# Patient Record
Sex: Female | Born: 1965
Health system: Southern US, Community
[De-identification: ages and names within clinical notes are randomized; demographics above are authoritative.]

## PROBLEM LIST (undated history)

## (undated) ENCOUNTER — Emergency Department: Payer: Medicare HMO

## (undated) DIAGNOSIS — N393 Stress incontinence (female) (male): Secondary | ICD-10-CM

## (undated) DIAGNOSIS — M1990 Unspecified osteoarthritis, unspecified site: Secondary | ICD-10-CM

## (undated) DIAGNOSIS — Z86718 Personal history of other venous thrombosis and embolism: Secondary | ICD-10-CM

## (undated) DIAGNOSIS — R569 Unspecified convulsions: Secondary | ICD-10-CM

## (undated) DIAGNOSIS — I5189 Other ill-defined heart diseases: Secondary | ICD-10-CM

## (undated) DIAGNOSIS — I219 Acute myocardial infarction, unspecified: Secondary | ICD-10-CM

## (undated) DIAGNOSIS — N200 Calculus of kidney: Secondary | ICD-10-CM

## (undated) DIAGNOSIS — F32A Depression, unspecified: Secondary | ICD-10-CM

## (undated) DIAGNOSIS — I77810 Thoracic aortic ectasia: Secondary | ICD-10-CM

## (undated) DIAGNOSIS — K219 Gastro-esophageal reflux disease without esophagitis: Secondary | ICD-10-CM

## (undated) DIAGNOSIS — R011 Cardiac murmur, unspecified: Secondary | ICD-10-CM

## (undated) DIAGNOSIS — E11319 Type 2 diabetes mellitus with unspecified diabetic retinopathy without macular edema: Secondary | ICD-10-CM

## (undated) DIAGNOSIS — I509 Heart failure, unspecified: Secondary | ICD-10-CM

## (undated) DIAGNOSIS — F419 Anxiety disorder, unspecified: Secondary | ICD-10-CM

## (undated) DIAGNOSIS — G4730 Sleep apnea, unspecified: Secondary | ICD-10-CM

## (undated) DIAGNOSIS — I499 Cardiac arrhythmia, unspecified: Secondary | ICD-10-CM

## (undated) DIAGNOSIS — Z9289 Personal history of other medical treatment: Secondary | ICD-10-CM

## (undated) DIAGNOSIS — R51 Headache: Secondary | ICD-10-CM

## (undated) DIAGNOSIS — G40A09 Absence epileptic syndrome, not intractable, without status epilepticus: Secondary | ICD-10-CM

## (undated) DIAGNOSIS — H548 Legal blindness, as defined in USA: Secondary | ICD-10-CM

## (undated) DIAGNOSIS — J449 Chronic obstructive pulmonary disease, unspecified: Secondary | ICD-10-CM

## (undated) DIAGNOSIS — G43909 Migraine, unspecified, not intractable, without status migrainosus: Secondary | ICD-10-CM

## (undated) DIAGNOSIS — I10 Essential (primary) hypertension: Secondary | ICD-10-CM

## (undated) DIAGNOSIS — E785 Hyperlipidemia, unspecified: Secondary | ICD-10-CM

## (undated) DIAGNOSIS — F329 Major depressive disorder, single episode, unspecified: Secondary | ICD-10-CM

## (undated) DIAGNOSIS — N12 Tubulo-interstitial nephritis, not specified as acute or chronic: Secondary | ICD-10-CM

## (undated) DIAGNOSIS — J189 Pneumonia, unspecified organism: Secondary | ICD-10-CM

## (undated) DIAGNOSIS — R42 Dizziness and giddiness: Secondary | ICD-10-CM

## (undated) DIAGNOSIS — I639 Cerebral infarction, unspecified: Secondary | ICD-10-CM

## (undated) DIAGNOSIS — I6350 Cerebral infarction due to unspecified occlusion or stenosis of unspecified cerebral artery: Secondary | ICD-10-CM

## (undated) DIAGNOSIS — E1140 Type 2 diabetes mellitus with diabetic neuropathy, unspecified: Secondary | ICD-10-CM

## (undated) DIAGNOSIS — R52 Pain, unspecified: Secondary | ICD-10-CM

## (undated) DIAGNOSIS — G629 Polyneuropathy, unspecified: Secondary | ICD-10-CM

## (undated) HISTORY — DX: Calculus of kidney: N20.0

## (undated) HISTORY — DX: Personal history of other medical treatment: Z92.89

## (undated) HISTORY — DX: Other ill-defined heart diseases: I51.89

## (undated) HISTORY — DX: Thoracic aortic ectasia: I77.810

## (undated) HISTORY — DX: Cerebral infarction, unspecified: I63.9

## (undated) HISTORY — DX: Essential (primary) hypertension: I10

## (undated) HISTORY — DX: Type 2 diabetes mellitus with unspecified diabetic retinopathy without macular edema: E11.319

## (undated) HISTORY — DX: Unspecified convulsions: R56.9

## (undated) HISTORY — PX: OTHER SURGICAL HISTORY: SHX169

## (undated) HISTORY — DX: Anxiety disorder, unspecified: F41.9

## (undated) HISTORY — PX: SHUNT REMOVAL: SHX342

## (undated) HISTORY — DX: Major depressive disorder, single episode, unspecified: F32.9

## (undated) HISTORY — PX: BRAIN HEMATOMA EVACUATION: SHX573

---

## 1988-01-20 HISTORY — PX: RETINAL DETACHMENT SURGERY: SHX105

## 1994-01-19 HISTORY — PX: VAGINAL HYSTERECTOMY: SUR661

## 1997-04-27 ENCOUNTER — Encounter: Admission: RE | Admit: 1997-04-27 | Discharge: 1997-04-27 | Payer: Self-pay | Admitting: Internal Medicine

## 1997-05-11 ENCOUNTER — Encounter: Admission: RE | Admit: 1997-05-11 | Discharge: 1997-05-11 | Payer: Self-pay | Admitting: Obstetrics & Gynecology

## 1997-06-01 ENCOUNTER — Encounter: Admission: RE | Admit: 1997-06-01 | Discharge: 1997-06-01 | Payer: Self-pay | Admitting: Obstetrics & Gynecology

## 1997-06-01 ENCOUNTER — Encounter: Admission: RE | Admit: 1997-06-01 | Discharge: 1997-06-01 | Payer: Self-pay | Admitting: Internal Medicine

## 1997-06-04 ENCOUNTER — Inpatient Hospital Stay (HOSPITAL_COMMUNITY): Admission: EM | Admit: 1997-06-04 | Discharge: 1997-06-07 | Payer: Self-pay | Admitting: *Deleted

## 1997-07-02 ENCOUNTER — Inpatient Hospital Stay (HOSPITAL_COMMUNITY): Admission: AD | Admit: 1997-07-02 | Discharge: 1997-07-02 | Payer: Self-pay | Admitting: Obstetrics & Gynecology

## 1997-07-06 ENCOUNTER — Encounter: Admission: RE | Admit: 1997-07-06 | Discharge: 1997-07-06 | Payer: Self-pay | Admitting: Obstetrics & Gynecology

## 1997-07-27 ENCOUNTER — Encounter: Admission: RE | Admit: 1997-07-27 | Discharge: 1997-07-27 | Payer: Self-pay | Admitting: Obstetrics & Gynecology

## 1997-08-28 ENCOUNTER — Encounter: Admission: RE | Admit: 1997-08-28 | Discharge: 1997-08-28 | Payer: Self-pay | Admitting: Obstetrics & Gynecology

## 1997-09-19 ENCOUNTER — Encounter: Admission: RE | Admit: 1997-09-19 | Discharge: 1997-09-19 | Payer: Self-pay | Admitting: Internal Medicine

## 1997-09-25 ENCOUNTER — Other Ambulatory Visit: Admission: RE | Admit: 1997-09-25 | Discharge: 1997-09-25 | Payer: Self-pay | Admitting: Obstetrics & Gynecology

## 1997-09-25 ENCOUNTER — Encounter: Admission: RE | Admit: 1997-09-25 | Discharge: 1997-09-25 | Payer: Self-pay | Admitting: Obstetrics & Gynecology

## 1997-09-27 ENCOUNTER — Ambulatory Visit (HOSPITAL_COMMUNITY): Admission: RE | Admit: 1997-09-27 | Discharge: 1997-09-27 | Payer: Self-pay | Admitting: Obstetrics & Gynecology

## 1997-09-27 ENCOUNTER — Encounter: Payer: Self-pay | Admitting: Obstetrics & Gynecology

## 1997-11-20 ENCOUNTER — Encounter: Admission: RE | Admit: 1997-11-20 | Discharge: 1997-11-20 | Payer: Self-pay | Admitting: Obstetrics & Gynecology

## 1997-11-20 ENCOUNTER — Other Ambulatory Visit: Admission: RE | Admit: 1997-11-20 | Discharge: 1997-11-20 | Payer: Self-pay | Admitting: Obstetrics & Gynecology

## 1997-12-04 ENCOUNTER — Inpatient Hospital Stay (HOSPITAL_COMMUNITY): Admission: RE | Admit: 1997-12-04 | Discharge: 1997-12-06 | Payer: Self-pay | Admitting: *Deleted

## 1997-12-11 ENCOUNTER — Encounter: Admission: RE | Admit: 1997-12-11 | Discharge: 1997-12-11 | Payer: Self-pay | Admitting: Obstetrics & Gynecology

## 1997-12-11 ENCOUNTER — Encounter: Payer: Self-pay | Admitting: Obstetrics & Gynecology

## 1997-12-11 ENCOUNTER — Ambulatory Visit (HOSPITAL_COMMUNITY): Admission: RE | Admit: 1997-12-11 | Discharge: 1997-12-11 | Payer: Self-pay | Admitting: Obstetrics & Gynecology

## 1997-12-18 ENCOUNTER — Encounter: Admission: RE | Admit: 1997-12-18 | Discharge: 1997-12-18 | Payer: Self-pay | Admitting: Obstetrics & Gynecology

## 1998-01-21 ENCOUNTER — Encounter: Admission: RE | Admit: 1998-01-21 | Discharge: 1998-01-21 | Payer: Self-pay | Admitting: Internal Medicine

## 1998-01-22 ENCOUNTER — Encounter: Admission: RE | Admit: 1998-01-22 | Discharge: 1998-01-22 | Payer: Self-pay | Admitting: Obstetrics & Gynecology

## 1998-08-27 ENCOUNTER — Encounter: Admission: RE | Admit: 1998-08-27 | Discharge: 1998-08-27 | Payer: Self-pay | Admitting: Hematology and Oncology

## 1998-09-30 ENCOUNTER — Encounter: Admission: RE | Admit: 1998-09-30 | Discharge: 1998-09-30 | Payer: Self-pay | Admitting: Internal Medicine

## 2002-09-18 ENCOUNTER — Emergency Department (HOSPITAL_COMMUNITY): Admission: EM | Admit: 2002-09-18 | Discharge: 2002-09-18 | Payer: Self-pay | Admitting: Emergency Medicine

## 2002-12-19 ENCOUNTER — Emergency Department (HOSPITAL_COMMUNITY): Admission: EM | Admit: 2002-12-19 | Discharge: 2002-12-19 | Payer: Self-pay | Admitting: Emergency Medicine

## 2003-03-24 ENCOUNTER — Emergency Department (HOSPITAL_COMMUNITY): Admission: EM | Admit: 2003-03-24 | Discharge: 2003-03-24 | Payer: Self-pay | Admitting: Emergency Medicine

## 2006-09-10 ENCOUNTER — Encounter (INDEPENDENT_AMBULATORY_CARE_PROVIDER_SITE_OTHER): Payer: Self-pay | Admitting: Nurse Practitioner

## 2006-09-10 ENCOUNTER — Ambulatory Visit: Payer: Self-pay | Admitting: Internal Medicine

## 2006-09-10 LAB — CONVERTED CEMR LAB
Glucose, Bld: 113 mg/dL
Hgb A1c MFr Bld: 5.6 %

## 2006-09-13 ENCOUNTER — Encounter: Payer: Self-pay | Admitting: Nurse Practitioner

## 2006-09-13 DIAGNOSIS — R569 Unspecified convulsions: Secondary | ICD-10-CM | POA: Insufficient documentation

## 2006-09-13 DIAGNOSIS — G40A09 Absence epileptic syndrome, not intractable, without status epilepticus: Secondary | ICD-10-CM

## 2006-09-13 DIAGNOSIS — I252 Old myocardial infarction: Secondary | ICD-10-CM | POA: Insufficient documentation

## 2006-09-13 DIAGNOSIS — F32A Depression, unspecified: Secondary | ICD-10-CM | POA: Insufficient documentation

## 2006-09-13 DIAGNOSIS — G40909 Epilepsy, unspecified, not intractable, without status epilepticus: Secondary | ICD-10-CM | POA: Insufficient documentation

## 2006-09-13 DIAGNOSIS — F419 Anxiety disorder, unspecified: Secondary | ICD-10-CM

## 2006-09-13 DIAGNOSIS — I10 Essential (primary) hypertension: Secondary | ICD-10-CM | POA: Insufficient documentation

## 2006-09-13 DIAGNOSIS — E782 Mixed hyperlipidemia: Secondary | ICD-10-CM | POA: Insufficient documentation

## 2006-09-13 DIAGNOSIS — F329 Major depressive disorder, single episode, unspecified: Secondary | ICD-10-CM

## 2006-09-13 DIAGNOSIS — K219 Gastro-esophageal reflux disease without esophagitis: Secondary | ICD-10-CM | POA: Insufficient documentation

## 2006-09-13 DIAGNOSIS — F330 Major depressive disorder, recurrent, mild: Secondary | ICD-10-CM | POA: Insufficient documentation

## 2006-09-13 DIAGNOSIS — E785 Hyperlipidemia, unspecified: Secondary | ICD-10-CM | POA: Insufficient documentation

## 2006-09-13 HISTORY — DX: Depression, unspecified: F32.A

## 2006-09-13 HISTORY — DX: Anxiety disorder, unspecified: F41.9

## 2006-09-13 HISTORY — DX: Absence epileptic syndrome, not intractable, without status epilepticus: G40.A09

## 2006-09-14 DIAGNOSIS — G43909 Migraine, unspecified, not intractable, without status migrainosus: Secondary | ICD-10-CM

## 2006-09-14 DIAGNOSIS — H548 Legal blindness, as defined in USA: Secondary | ICD-10-CM | POA: Insufficient documentation

## 2006-09-14 HISTORY — DX: Migraine, unspecified, not intractable, without status migrainosus: G43.909

## 2006-09-21 ENCOUNTER — Ambulatory Visit: Payer: Self-pay | Admitting: Internal Medicine

## 2006-09-21 LAB — CONVERTED CEMR LAB
ALT: 12 units/L (ref 0–35)
AST: 7 units/L (ref 0–37)
Albumin: 4 g/dL (ref 3.5–5.2)
Alkaline Phosphatase: 59 units/L (ref 39–117)
BUN: 12 mg/dL (ref 6–23)
Basophils Absolute: 0 10*3/uL (ref 0.0–0.1)
Basophils Relative: 0 % (ref 0–1)
CO2: 18 meq/L — ABNORMAL LOW (ref 19–32)
Calcium: 9.1 mg/dL (ref 8.4–10.5)
Chloride: 109 meq/L (ref 96–112)
Cholesterol: 136 mg/dL (ref 0–200)
Creatinine, Ser: 0.83 mg/dL (ref 0.40–1.20)
Eosinophils Absolute: 0 10*3/uL (ref 0.0–0.7)
Eosinophils Relative: 0 % (ref 0–5)
Glucose, Bld: 87 mg/dL (ref 70–99)
HCT: 39.4 % (ref 36.0–46.0)
HDL: 37 mg/dL — ABNORMAL LOW (ref >39)
Hemoglobin: 12.1 g/dL (ref 12.0–15.0)
LDL Cholesterol: 81 mg/dL (ref 0–99)
Lymphocytes Relative: 16 % (ref 12–46)
Lymphs Abs: 1 10*3/uL (ref 0.7–3.3)
MCHC: 30.7 g/dL (ref 30.0–36.0)
MCV: 98 fL (ref 78.0–100.0)
Monocytes Absolute: 0.6 10*3/uL (ref 0.2–0.7)
Monocytes Relative: 9 % (ref 3–11)
Neutro Abs: 4.7 10*3/uL (ref 1.7–7.7)
Neutrophils Relative %: 75 % (ref 43–77)
Platelets: 263 10*3/uL (ref 150–400)
Potassium: 4.1 meq/L (ref 3.5–5.3)
RBC: 4.02 M/uL (ref 3.87–5.11)
RDW: 14.4 % — ABNORMAL HIGH (ref 11.5–14.0)
Sodium: 140 meq/L (ref 135–145)
TSH: 0.584 microintl units/mL (ref 0.350–5.50)
Total Bilirubin: 0.3 mg/dL (ref 0.3–1.2)
Total CHOL/HDL Ratio: 3.7
Total Protein: 7.4 g/dL (ref 6.0–8.3)
Triglycerides: 91 mg/dL (ref <150)
VLDL: 18 mg/dL (ref 0–40)
WBC: 6.2 10*3/uL (ref 4.0–10.5)

## 2006-09-27 ENCOUNTER — Telehealth (INDEPENDENT_AMBULATORY_CARE_PROVIDER_SITE_OTHER): Payer: Self-pay | Admitting: Nurse Practitioner

## 2006-09-30 ENCOUNTER — Ambulatory Visit: Payer: Self-pay | Admitting: Internal Medicine

## 2006-09-30 DIAGNOSIS — E119 Type 2 diabetes mellitus without complications: Secondary | ICD-10-CM | POA: Insufficient documentation

## 2006-09-30 LAB — CONVERTED CEMR LAB
Bilirubin Urine: NEGATIVE
Blood Glucose, Fingerstick: 123
Blood in Urine, dipstick: NEGATIVE
Glucose, Urine, Semiquant: NEGATIVE
Ketones, urine, test strip: NEGATIVE
Microalb, Ur: 1.88 mg/dL (ref 0.00–1.89)
Nitrite: NEGATIVE
Protein, U semiquant: NEGATIVE
Specific Gravity, Urine: 1.02
Urobilinogen, UA: 0.2
WBC Urine, dipstick: NEGATIVE
pH: 6.5

## 2006-10-12 ENCOUNTER — Encounter (INDEPENDENT_AMBULATORY_CARE_PROVIDER_SITE_OTHER): Payer: Self-pay | Admitting: Family Medicine

## 2006-10-12 ENCOUNTER — Ambulatory Visit: Payer: Self-pay | Admitting: Nurse Practitioner

## 2006-10-12 DIAGNOSIS — R42 Dizziness and giddiness: Secondary | ICD-10-CM | POA: Insufficient documentation

## 2006-10-12 LAB — CONVERTED CEMR LAB: Blood Glucose, Fingerstick: 106

## 2006-10-15 ENCOUNTER — Emergency Department (HOSPITAL_COMMUNITY): Admission: EM | Admit: 2006-10-15 | Discharge: 2006-10-15 | Payer: Self-pay | Admitting: Emergency Medicine

## 2006-10-15 LAB — CONVERTED CEMR LAB
ALT: 14 units/L
AST: 11 units/L
Albumin: 3.1 g/dL
Alkaline Phosphatase: 47 units/L
BUN: 9 mg/dL
CO2: 21 meq/L
Calcium: 8.8 mg/dL
Chloride: 108 meq/L
Creatinine, Ser: 0.86 mg/dL
Glucose, Bld: 119 mg/dL
HCT: 33.2 %
Hemoglobin: 11.1 g/dL
MCHC: 33.4 g/dL
MCV: 93.1 fL
Platelets: 229 10*3/uL
Potassium: 3.6 meq/L
RBC: 3.57 M/uL
RDW: 14.2 %
Sodium: 134 meq/L
Total Bilirubin: 0.6 mg/dL
Total Protein: 6.4 g/dL
WBC: 17.2 10*3/uL

## 2006-10-17 ENCOUNTER — Encounter (INDEPENDENT_AMBULATORY_CARE_PROVIDER_SITE_OTHER): Payer: Self-pay | Admitting: Nurse Practitioner

## 2006-10-18 ENCOUNTER — Telehealth (INDEPENDENT_AMBULATORY_CARE_PROVIDER_SITE_OTHER): Payer: Self-pay | Admitting: Nurse Practitioner

## 2006-10-21 ENCOUNTER — Telehealth (INDEPENDENT_AMBULATORY_CARE_PROVIDER_SITE_OTHER): Payer: Self-pay | Admitting: *Deleted

## 2006-10-25 ENCOUNTER — Ambulatory Visit: Payer: Self-pay | Admitting: Nurse Practitioner

## 2006-10-25 LAB — CONVERTED CEMR LAB
Basophils Absolute: 0 10*3/uL (ref 0.0–0.1)
Basophils Relative: 0 % (ref 0–1)
Blood Glucose, Fingerstick: 119
Eosinophils Absolute: 0 10*3/uL (ref 0.0–0.7)
Eosinophils Relative: 0 % (ref 0–5)
HCT: 42.6 % (ref 36.0–46.0)
Hemoglobin: 13.2 g/dL (ref 12.0–15.0)
Lymphocytes Relative: 25 % (ref 12–46)
Lymphs Abs: 1.8 10*3/uL (ref 0.7–3.3)
MCHC: 31 g/dL (ref 30.0–36.0)
MCV: 98.4 fL (ref 78.0–100.0)
Monocytes Absolute: 0.5 10*3/uL (ref 0.2–0.7)
Monocytes Relative: 7 % (ref 3–11)
Neutro Abs: 4.9 10*3/uL (ref 1.7–7.7)
Neutrophils Relative %: 68 % (ref 43–77)
Platelets: 433 10*3/uL — ABNORMAL HIGH (ref 150–400)
RBC: 4.33 M/uL (ref 3.87–5.11)
RDW: 14.3 % — ABNORMAL HIGH (ref 11.5–14.0)
WBC: 7.2 10*3/uL (ref 4.0–10.5)

## 2006-10-26 ENCOUNTER — Telehealth (INDEPENDENT_AMBULATORY_CARE_PROVIDER_SITE_OTHER): Payer: Self-pay | Admitting: Nurse Practitioner

## 2007-03-20 LAB — CONVERTED CEMR LAB: Pap Smear: NORMAL

## 2007-11-14 ENCOUNTER — Ambulatory Visit: Payer: Self-pay | Admitting: Nurse Practitioner

## 2007-11-14 DIAGNOSIS — I6350 Cerebral infarction due to unspecified occlusion or stenosis of unspecified cerebral artery: Secondary | ICD-10-CM | POA: Insufficient documentation

## 2007-11-14 LAB — CONVERTED CEMR LAB
Blood Glucose, Fingerstick: 131
Hgb A1c MFr Bld: 6.3 %

## 2007-12-29 ENCOUNTER — Encounter (INDEPENDENT_AMBULATORY_CARE_PROVIDER_SITE_OTHER): Payer: Self-pay | Admitting: Nurse Practitioner

## 2007-12-30 ENCOUNTER — Encounter (INDEPENDENT_AMBULATORY_CARE_PROVIDER_SITE_OTHER): Payer: Self-pay | Admitting: Nurse Practitioner

## 2007-12-30 ENCOUNTER — Emergency Department (HOSPITAL_COMMUNITY): Admission: EM | Admit: 2007-12-30 | Discharge: 2007-12-31 | Payer: Self-pay | Admitting: Emergency Medicine

## 2008-02-01 ENCOUNTER — Encounter (INDEPENDENT_AMBULATORY_CARE_PROVIDER_SITE_OTHER): Payer: Self-pay | Admitting: Nurse Practitioner

## 2010-02-18 NOTE — Assessment & Plan Note (Signed)
Summary: PER [name] MARTIN/ F/U ONE MONTH//GK   Vital Signs:  Patient Profile:   45 Years Old Female Height:     64.4 inches Weight:      225 pounds BMI:     38.28 BSA:     2.07 Temp:     97 degrees F oral Pulse rate:   96 / minute Pulse rhythm:   regular Resp:     20 per minute BP sitting:   114 / 80  (right arm) Cuff size:   regular  Pt. in pain?   yes    Location:   left side    Intensity:   8    Type:       tingling  Vitals Entered By: Bridgett Larsson RN (October 12, 2006 11:36 AM)              Is Patient Diabetic? Yes  CBG Result 106 CBG Device ID b     History of Present Illness:   45 yo AA female with h/o CVA,MI(2003),DM,HTN,Depression.   Here c/o dizziness and fatigue. Notes chronic left-sided weakness since CVA in 2003.Has chrronic h/o tingling in left leg and now feels tingling in left arm.Denies chest pain or SOB. Pt denies changes in vision or new-onset muscle weakness. She is blind in her left eye and can only see some light. She has known aphasia since CVA, seems initially to be talking slower but when starts to converse with animation,her speech speeds up and is  easily understood by MD and only slightly slurred. Her Husband is a limited historian but says pt has not c/o anything but dizziness today.  Pt herself is a good historian and when prompted can recall her meds and doses. She can recall dates and info on her past medical history. She is knowledgeable enough to provide info to request old records from [facility], including maiden name and the fact that her date of birth will be needed.  During visit,Pt becomes tearful. She feels overwhelmed since leaving her assissted-living facility 2 months ago. She and her husband(who has psych/medical problems) moved in with a daughter. the plan was for patient to watch the grandchildren each day. Pt feels that she is unable to continue to do this every day and told her daughter this info this AM. Patient is unsure about  staying with daughter or returning to the assissted living. 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 She liked the assisted living and notes that the staff did not want her to leave. Pt says she is her own guardian. She denies any other psychiatric history other that depression. She denies any history of psychiatric hospitalizations. Pt reports that her sugars have been very low and that she has not been eating meals as she should.@ days ago, she had a BS of 42.  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       MIGRAINE       CVA X4       Hyperlipidemia       Myocardial infarction, hx  of X3       BLADDER PROBLEMS  Past Surgical History:    Reviewed history from 09/13/2006 and no changes required:       HEMATOMA REMOVAL ON LFT SIDE OF BRAIN        S/P SHUNT BUT REMOVED AT AGE 79       GYN surgery PARTIAL HYSTORECTOMY AGE 56       GYN surgery TUBAL LIGATION 23     Review of Systems      See HPI   Physical Exam  General:     Well-developed,well-nourished,in no acute distress; alert,appropriate and cooperative throughout examination.Flat affect Eyes:     No corneal or conjunctival inflammation noted. Has minimal pupillary reaction on left and cannot see 2 fingers directly in front of left eye. Right eye with PERRLA and EOMI. Fundoscopic exam with sharp optic disc. Ears:     External ear exam shows no significant lesions or deformities.  Otoscopic examination reveals clear canals, tympanic membranes are intact bilaterally without bulging, retraction, inflammation or discharge. Hearing is grossly normal bilaterally. Nose:     External nasal examination shows no deformity or inflammation. Nasal mucosa  are pink and moist without lesions or exudates. Mouth:     Oral mucosa and oropharynx without lesions or exudates.  No problem with swallowing or drooling. Summetrical palate lift and normal gag reflex. Neck:     supple, full ROM, no masses, and no thyromegaly.   No carotid bruits Lungs:     Normal respiratory effort, chest expands symmetrically. Lungs are clear to auscultation, no crackles or wheezes. Heart:     Normal rate and regular rhythm. S1 and S2 normal without gallop, murmur, click, rub or other extra sounds. Msk:     UE exam: Rt strenght all muscle groups and grip 5/5. Left grip 4/5. Initially,deltoid testing with give-away presentation on exam but pt improved with coaching to 4/5.Biceps-tricepts testing 4/5.  Patient able to hold both arms up over head for 2 minutes without any drift on either side. LE exam: Right side 5/5 all muscle groups. Left side 5/5 all groups except resisted dorsiflexion which was 4/5. Reflexes 3+ symmetric throughout. Neurologic:     No cranial nerve deficits noted. Station and gait are normal. DTRs are symmetrical throughout.  Cranial nerves intact to specific testing except chronic left eye blindness. Psych:     Oriented X3, memory intact for recent and remote, depressed affect, flat affect, poor eye contact, and tearful.Speech with baseline dysarthria;improves as patient becomes more animated in conversation.      Impression & Recommendations:  Problem # 1:  DIZZINESS (ICD-780.4) Suspect multi-factorial; from tight sugar control,depression,poor eating habits,anxiety. Her blood pressure is controlled today Pt to eat three times a day and have bedtime snack.She is to monitor CBGs;especially if feels dizzy or ill. Will decrease metformin to 53m 1/2 tablet by mouth two times a day to see is this helps. Pt to f/u in 2 weeks for recheck,sooner if dizziness persists or worsens. 09/22/2006 labwork all normal and reviewed with pt.  Problem # 2:  DIABETES  MELLITUS, TYPE II (ICD-250.00)  The following medications were removed from the medication list:    Metformin Hcl 500 Mg Tabs (Metformin hcl) ..[name]..Marland KitchenTwo times a day  Her updated medication list for this problem includes:    Lisinopril 10 Mg Tabs (Lisinopril) ..[name].. 1 by mouth once daily    Adult Aspirin Low Strength 81 Mg Tbdp (Aspirin) ..[name].. 1 by mouth qd  Glucophage 500 Mg Tabs (Metformin hcl) .[name]... Take 1/2 tablet by mouth bid  Orders: Capillary Blood Glucose (82948) Fingerstick (95188)   Problem # 3:  DEPRESSION (ICD-311)  Her updated medication list for this problem includes:    Fluoxetine Hcl 20 Mg Caps (Fluoxetine hcl) .Marland KitchenMarland KitchenMarland KitchenMarland Kitchen 3 by mouth q am    Trazodone Hcl 100 Mg Tabs (Trazodone hcl) .[name]... 1 by mouth qhs Pt to try decreasing her trazodone to 1/2 (129m) tablet at bedtime to see if this is a factor in dizziness. Suspect depression is not fully treated and she may need referral for counseling and help deciding on whether she wishes to return to her previous assisted-living situation.  Problem # 4:  UPPER RESPIRATORY INFECTION (ICD-465.9) Resolving per patient. PT requests tessalon perles for occ cough. She was unable to afford the tussionex. The following medications were removed from the medication list:    Tussionex Pennkinetic Er 8-10 Mg/515mLqcr (Chlorpheniramine-hydrocodone) ...[name]. 1 teaspoon every 12 hours as needed for cough  Her updated medication list for this problem includes:    Adult Aspirin Low Strength 81 Mg Tbdp (Aspirin) ...[name]. 1 by mouth qd    Tessalon Perles 100 Mg Caps (Benzonatate) ...[name]. Take 1 pill  by mouth every 8 hours as needed for cough   Complete Medication List: 1)  Lisinopril 10 Mg Tabs (Lisinopril) ...[name] 1 by mouth once daily 2)  Omeprazole 20 Mg Cpdr (Omeprazole) .... I by mouth once daily 3)  Fluoxetine Hcl 20 Mg Caps (Fluoxetine hcl) .... 3 by mouth q am 4)  Verapamil Hcl Cr 240 Mg Cp24 (Verapamil hcl) ...[name] 1 by mouth once daily 5)   Simvastatin 20 Mg Tabs (Simvastatin) ...[name] 1 by mouth once daily 6)  Plavix 75 Mg Tabs (Clopidogrel bisulfate) ...[name] 1 by mouth once daily 7)  Adult Aspirin Low Strength 81 Mg Tbdp (Aspirin) ...[name] 1 by mouth qd 8)  Trazodone Hcl 100 Mg Tabs (Trazodone hcl) ...[name] 1 by mouth qhs 9)  Darvocet-n 100 100-650 Mg Tabs (Propoxyphene n-apap) .... Q6 prn 10)  Lyrica 100 Mg Caps (Pregabalin) ...[name] 1 by mouth two times a day 11)  Topamax 100 Mg Tabs (Topiramate) ...[name] 1 by mouth q am 12)  Topamax 100 Mg Tabs (Topiramate) .... 2 by mouth  q hs 13)  Amoxil 500 Mg Caps (Amoxicillin) ...[name] 1 tablet by mouth three times a day for infection 14)  Glucophage 500 Mg Tabs (Metformin hcl) .... Take 1/2 tablet by mouth bid 15)  Tessalon Perles 100 Mg Caps (Benzonatate) .... Take 1 pill  by mouth every 8 hours as needed for cough   Patient Instructions: 1)  Please schedule a follow-up appointment in 2 weeks(with Dr.Caidyn Henricksen) 2)  Decrease metformin to a half a pill AM and PM.MUST EAT 3 meals and snacks,especially at bedtime. 3)  Check sugars AM and PM. Notify Doctor if sugars falling below 60. 4)  .    Prescriptions: TESSALON PERLES 100 MG  CAPS (BENZONATATE) Take 1 pill  by mouth every 8 hours as needed for cough  #30 x 0   Entered and Authorized by:   ViMilagros EvenerD   Signed by:   ViMilagros EvenerD on 10/12/2006   Method used:   Print then Give to Patient   RxID:   [id number] 500 MG  TABS (METFORMIN HCL) Take 1/2 tablet by mouth BID  #15 x 1   Entered and Authorized by:   ViMilagros EvenerD   Signed by:  Milagros Evener MD on 10/12/2006   Method used:   Print then Give to Patient   RxID:   [id number] GLUCOPHAGE 500 MG  TABS (METFORMIN HCL) Take 1/2 tablet by mouth BID  #15 x 1   Entered and Authorized by:   Milagros Evener MD   Signed by:   Milagros Evener MD on 10/12/2006   Method used:   Print then Give to Patient   RxID:   [id number]  ]

## 2010-02-18 NOTE — Assessment & Plan Note (Signed)
Summary: Pneumonia/DM   Vital Signs:  Patient Profile:   45 Years Old Female Height:     64.4 inches Weight:      213 pounds BMI:     36.24 BSA:     2.02 O2 Sat:      97 % Temp:     97.8 degrees F oral Pulse rate:   80 / minute Pulse rhythm:   regular Resp:     20 per minute BP sitting:   150 / 110  (right arm) Cuff size:   regular  Pt. in pain?   yes    Location:   back    Intensity:   9    Type:       aching  Vitals Entered By: Bridgett Larsson RN (October 25, 2006 8:32 AM) Oxygen therapy Room Air              Is Patient Diabetic? Yes  Nutritional Status Detail fasting CBG Result 119 CBG Device ID A  Does patient need assistance? Ambulation Normal Comments pt also takes flexeril 10 mg 1 by mouth two times a day      Chief Complaint:  F/U from ER visit pneumonia and back pain.  History of Present Illness: Pt into the office to f/u on Er visit on 10/15/06 during which she was dx with pneumonia.   Pt reports that she is still coughing and having some pain. Pt has finished the antibiotics as ordered on that visit.   Pt reports that she is still having some weakness and has not fully recovered in that regard.  Diabetes - blood sugars have been stable during this acute illness.  she reports some hypoglycemia during her original illness however blood sugars have stablized and she is taking 1 tablet daily.    Pt reports that she wants to go back to assisted living in [place].  she was there 1 1/2 years prior to discharge and going to her daughters.  Pt reports that when she was in the assisted living she did not have to keep up with her meds, or doctors appointments.  Pt acknowledges that she was better when she was in a controlled environment.  Pt states that her husband is not going back to the facility.  Pt states that she was up half of the night with GERD symptoms.  She had some fatty foods on yesturday which triggered symptoms.  No tobacco use.   Diabetes  Management History:      She states that she is not following the diet appropriately.  No sensory loss is reported.  Self foot exams are not being performed.  She is checking home blood sugars.  She says that she is exercising.        Hypoglycemic symptoms are not occurring.  No hyperglycemic symptoms are reported.        There are no symptoms to suggest diabetic complications.  No changes have been made to her treatment plan since last visit.       Current Allergies: No known allergies     Risk Factors:  Tobacco use:  quit    Year quit:  20 yrs Passive smoke exposure:  yes Drug use:  no HIV high-risk behavior:  no Caffeine use:  0 drinks per day Alcohol use:  no Exercise:  yes Seatbelt use:  100 %  Family History Risk Factors:    Family History of MI in females < 71 years old:  no  Family History of MI in males < 9 years old:  no   Review of Systems  General      Denies chills, fatigue, and fever.  ENT      angioedema with codeine cough syrup given in ER  CV      Denies chest pain or discomfort, fatigue, and palpitations.  Resp      Complains of chest discomfort and cough.      Denies coughing up blood and shortness of breath.  GI      Denies abdominal pain, nausea, and vomiting.  MS      Complains of muscle weakness.  Neuro      Complains of headaches.  Psych      Complains of depression.   Physical Exam  General:     alert, well-developed, and overweight-appearing.   Head:     normocephalic.   Eyes:     exophthalmoses.   Nose:     no external deformity.   Neck:     supple.   Lungs:     bases with some crackles Heart:     Normal rate and regular rhythm. S1 and S2 normal without gallop, murmur, click, rub or other extra sounds. Abdomen:     soft and non-tender.  obese Msk:     up to exam table Extremities:     trace left pedal edema and trace right pedal edema.   Neurologic:     alert & oriented X3.   Skin:     turgor normal.     Psych:     depressed affect.      Impression & Recommendations:  Problem # 1:  PNEUMONIA, COMMUNITY ACQUIRED, PNEUMOCOCCAL (ICD-481) Pt seen in ER 10-15-06 for CAP.  she has finished Z-pack.  will check cbc today.  symptoms improved.  The following medications were removed from the medication list:    Amoxil 500 Mg Caps (Amoxicillin) .[name]... 1 tablet by mouth three times a day for infection  Orders: T-CBC w/Diff ([id number])   Problem # 2:  DIABETES MELLITUS, TYPE II (ICD-250.00) Pt to bring blood sugar log on next visit. Her updated medication list for this problem includes:    Lisinopril 10 Mg Tabs (Lisinopril) .[name]... 1 by mouth once daily    Adult Aspirin Low Strength 81 Mg Tbdp (Aspirin) .[name]... 1 by mouth qd    Glucophage 500 Mg Tabs (Metformin hcl) .[name]... Take 1 tablet by mouth two times a day for diabetes  Orders: Capillary Blood Glucose (63845) Fingerstick (36468) Capillary Blood Glucose (82948) Fingerstick (03212)   Problem # 3:  HYPERTENSION (ICD-401.9) Blood pressure elvated today.  continue current meds Her updated medication list for this problem includes:    Lisinopril 10 Mg Tabs (Lisinopril) .[name]... 1 by mouth once daily    Verapamil Hcl Cr 240 Mg Cp24 (Verapamil hcl) .[name]... 1 by mouth once daily   Problem # 4:  GERD (ICD-530.81) Avoid irritating foods. Her updated medication list for this problem includes:    Omeprazole 20 Mg Cpdr (Omeprazole) ..... I by mouth once daily   Problem # 5:  DEPRESSION (ICD-311) [place] as shceduled. Her updated medication list for this problem includes:    Fluoxetine Hcl 20 Mg Caps (Fluoxetine hcl) .Marland KitchenMarland KitchenMarland KitchenMarland Kitchen 3 by mouth q am    Trazodone Hcl 100 Mg Tabs (Trazodone hcl) .[name]... 1 by mouth at night   Complete Medication List: 1)  Lisinopril 10 Mg Tabs (Lisinopril) .[name].. 1 by mouth once daily 2)  Omeprazole  20 Mg Cpdr (Omeprazole) .... I by mouth once daily 3)  Fluoxetine Hcl 20 Mg Caps (Fluoxetine hcl) .... 3 by mouth q am 4)  Verapamil  Hcl Cr 240 Mg Cp24 (Verapamil hcl) .[name].. 1 by mouth once daily 5)  Simvastatin 20 Mg Tabs (Simvastatin) .[name].. 1 by mouth once daily for cholesterol 6)  Plavix 75 Mg Tabs (Clopidogrel bisulfate) .[name].. 1 by mouth once daily for circulation 7)  Adult Aspirin Low Strength 81 Mg Tbdp (Aspirin) .[name].. 1 by mouth qd 8)  Trazodone Hcl 100 Mg Tabs (Trazodone hcl) .[name].. 1 by mouth at night 9)  Darvocet-n 100 100-650 Mg Tabs (Propoxyphene n-apap) .... Q6 prn 10)  Lyrica 100 Mg Caps (Pregabalin) .[name].. 1 by mouth two times a day 11)  Topamax 100 Mg Tabs (Topiramate) .[name].. 1 by mouth q am 12)  Topamax 100 Mg Tabs (Topiramate) .... 2 by mouth  q hs 13)  Glucophage 500 Mg Tabs (Metformin hcl) .... Take 1 tablet by mouth two times a day for diabetes 14)  Tessalon Perles 100 Mg Caps (Benzonatate) .... Take 1 pill  by mouth every 8 hours as needed for cough 15)  Flexeril 10 Mg Tabs (Cyclobenzaprine hcl) .[name].. 1 tablet by mouth two times a day for spasms 16)  Sanctura 20 Mg Tabs (Trospium chloride) .[name].. 1 tablet by mouth two times a day for bladder  Diabetes Management Assessment/Plan:      The following lipid goals have been established for the patient: Total cholesterol goal of 200; LDL cholesterol goal of 100; HDL cholesterol goal of 40; Triglyceride goal of 200.  Her blood pressure goal is < 130/80.     Patient Instructions: 1)  Continue medications as ordered below. 2)  Follow up here in 1 month for chronic issues or sooner if current problems persists 3)  **pt has a previously scheduled appointment on 11/03/06**    Prescriptions: LYRICA 100 MG  CAPS (PREGABALIN) 1 by mouth two times a day  #60 x 3   Entered and Authorized by:   Aurora Mask [name]   Signed by:   Aurora Mask [name] on 10/25/2006   Method used:   Electronically sent to ...       [facility]       [place], [place]  [address]       Ph: [phone]       Fax: [phone]   RxID:    [id number] SIMVASTATIN 20 MG  TABS (SIMVASTATIN) 1 by mouth once daily for cholesterol  #30 x 3   Entered and Authorized by:   Aurora Mask [name]   Signed by:   Aurora Mask [name] on 10/25/2006   Method used:   Electronically sent to ...       [facility].*       [place], [place]  [address]       Ph: [phone]       Fax: [phone]   RxID:   [id number] PLAVIX 75 MG  TABS (CLOPIDOGREL BISULFATE) 1 by mouth once daily for circulation  #30 x 3   Entered and Authorized by:   Aurora Mask [name]   Signed by:   Aurora Mask [name] on 10/25/2006   Method used:   Electronically sent to .Marland KitchenMarland Kitchen  [facility]       [place], [place]  [address]       Ph: [phone]       Fax: [phone]   RxID:   [id number] SANCTURA 20 MG  TABS (TROSPIUM CHLORIDE) 1 tablet by mouth two times a day for bladder  #60 x 3   Entered and Authorized by:   Aurora Mask [name]   Signed by:   Aurora Mask [name] on 10/25/2006   Method used:   Electronically sent to ...       [facility]       [place], [place]  [address]       Ph: [phone]       Fax: [phone]   RxID:   [id number] GLUCOPHAGE 500 MG  TABS (METFORMIN HCL) Take 1 tablet by mouth two times a day for diabetes  #60 x 3   Entered and Authorized by:   Aurora Mask [name]   Signed by:   Aurora Mask [name] on 10/25/2006   Method used:   Electronically sent to ...       [facility].*       [place], [place]  [address]       Ph: [phone]       Fax: [phone]   RxID:   [id number] FLEXERIL 10 MG  TABS (CYCLOBENZAPRINE HCL) 1 tablet by mouth two times a day for spasms  #60 x 3   Entered and Authorized by:   Aurora Mask [name]   Signed by:   Aurora Mask [name] on 10/25/2006   Method used:   Electronically sent to ...        [facility].*       [place], [place]  [address]       Ph: [phone]       Fax: [phone]   RxID:   [id number]  ]  CXR  Procedure date:  10/15/2006  Findings:      bibasilar pneumonia and small associated bilateral pleural effusions  CT Scan  Procedure date:  10/15/2006  Findings:      head - mild diffuse atrophy, no acute abnormality.  Appended Document: Pneumonia/DM Lyrica called to pharmacy at 1410.  unable to fax (scheduled drug).

## 2010-02-18 NOTE — Progress Notes (Signed)
  Phone Note Call from Patient Call back at [facility] Phone [phone]   Caller: Patient Call For: [phone] Summary of Call: The patient told me that she needs a prenscription for [place] 100 mg [place] Initial call taken by: Alexis Goodell,  October 21, 2006 8:45 AM  Follow-up for Phone Call        pt is scheduled to come in Monday @8 :00am to be worked in but she was wanting to get a rx prior. Follow-up by: Vinetta Bergamo CMA,  October 22, 2006 2:25 PM  Additional Follow-up for Phone Call Additional follow up Details #1::        meds faxed to pharmacy. Additional Follow-up by: Aurora Mask [name],  October 22, 2006 2:29 PM    New/Updated Medications: TRAZODONE HCL 100 MG  TABS (TRAZODONE HCL) 1 by mouth at night   Prescriptions: TRAZODONE HCL 100 MG  TABS (TRAZODONE HCL) 1 by mouth at night  #30 x 3   Entered and Authorized by:   Aurora Mask [name]   Signed by:   Aurora Mask [name] on 10/22/2006   Method used:   Electronically sent to ...       [facility].*       [place], [place]  [address]       Ph: [phone]       Fax: [phone]   RxID:   [id number]

## 2010-02-18 NOTE — Progress Notes (Signed)
Summary: BAD COUGH  Phone Note Call from Patient Call back at [phone]   Caller: Patient Summary of Call: [facility] PATIENT. SHE HAS A BAD COUGH AND SHE COUGHS SO HARD THAT SHE URINATES ON HERSELF, AND SHE DOESN'T KNOW IF THIS IS A COLD OR WHAT. Initial call taken by: Roberto Scales,  September 27, 2006 10:29 AM  Follow-up for Phone Call        Appt Scheduled for 09/30/06 Follow-up by: Shellia Carwin CMA,  September 27, 2006 5:11 PM

## 2010-02-18 NOTE — Letter (Signed)
Summary: request for records /to [facility]  request for records /to [facility]   Imported By: Adela Lank [place] 10/20/2006 10:57:41  _____________________________________________________________________  External Attachment:    Type:   Image     Comment:   External Document

## 2010-02-18 NOTE — Progress Notes (Signed)
Summary: Phone note  Phone Note Call from Patient Call back at [facility] Phone [phone]   Caller: Patient Summary of Call: This patient went on Friday to ER and they diagnosticated with pneumonia and respiratory infection. Sees [name]  Initial call taken by: Alexis Goodell,  October 18, 2006 8:49 AM  Follow-up for Phone Call        Nurse to call pt. When does she need to be seen per ER discharge papers? Schedule f/u with her primary MD,N.Martin. SHe probably has a f/u already scheduled next week with N.Martin. Follow-up by: Milagros Evener MD,  October 18, 2006 1:34 PM  Additional Follow-up for Phone Call Additional follow up Details #1::        pt scheduled to come in [place]. see hospital dc in your office Additional Follow-up by: Vinetta Bergamo CMA,  October 22, 2006 2:25 PM

## 2010-02-18 NOTE — Miscellaneous (Signed)
Summary: Med change  Clinical Lists Changes  Medications: Changed medication from OMEPRAZOLE 20 MG  CPDR (OMEPRAZOLE) I by mouth once daily to RANITIDINE HCL 150 MG CAPS (RANITIDINE HCL) 1 tablet by mouth daily for stomach - Signed Rx of RANITIDINE HCL 150 MG CAPS (RANITIDINE HCL) 1 tablet by mouth daily for stomach;  #30 x 5;  Signed;  Entered by: Aurora Mask [name];  Authorized by: Aurora Mask [name];  Method used: Printed then faxed to [facility], [address], [place], [place], [place]  [address]  [place], Ph: [phone], Fax: [phone]    Prescriptions: RANITIDINE HCL 150 MG CAPS (RANITIDINE HCL) 1 tablet by mouth daily for stomach  #30 x 5   Entered and Authorized by:   Aurora Mask [name]   Signed by:   Aurora Mask [name] on 12/29/2007   Method used:   Printed then faxed to ...       [facility] (retail)       [address], [place]       [place], [place]  [address]  [place]       Ph: [phone]       Fax: [phone]   RxID:   [id number]

## 2010-02-18 NOTE — Medication Information (Signed)
Summary: NEW PATIENT MEDICATION REQUEST  NEW PATIENT MEDICATION REQUEST   Imported ByRoland Earl 12/30/2007 15:30:07  _____________________________________________________________________  External Attachment:    Type:   Image     Comment:   External Document

## 2010-02-18 NOTE — Assessment & Plan Note (Signed)
Summary: Diabetes/HTN   Vital Signs:  Patient Profile:   45 Years Old Female Height:     64.4 inches Weight:      239 pounds Temp:     97.5 degrees F oral Pulse rate:   80 / minute Pulse rhythm:   regular Resp:     20 per minute BP sitting:   130 / 90  (left arm) Cuff size:   regular  Pt. in pain?   no              Is Patient Diabetic? Yes CBG Result 131 CBG Device ID A Comments pt didn't bring her medications, she asked to call the [facility] close to here and they could faxed the list of her medications to here   Vision Comments: 04/2008   Chief Complaint:  F/U for DM and high bp.  History of Present Illness:  Pt into the office for follow up. Pt went back to assisted living in [place].   "I got sick and my daughter could not take care of me" She is now in a apartment.  She has been out of assisted for about 1 month.  Pt did not bring her medications into the office today.  Seizure - last about 5 months ago.  still taking topamax. Pt has petit mal seizure. Topamax was increased after the last seizure episode  Pt is not fasting today.    CPE done at while at assitive living last spring - 3/09.  Depression History:      The patient denies a depressed mood most of the day and a diminished interest in her usual daily activities.  The patient denies insomnia and fatigue (loss of energy).        Psychosocial stress factors include major life changes.  The patient denies that she feels like life is not worth living, denies that she wishes that she were dead, and denies that she has thought about ending her life.         Depression Treatment History:  Prior Medication Used:   Start Date: Assessment of Effect:   Comments:  Prozac (fluoxetine)     --     stable     --  Diabetes Management History:      The patient is a 45 years old female who comes in for evaluation of DM Type 2.  She states understanding of dietary principles and is following her diet  appropriately.  No sensory loss is reported.  Self foot exams are being performed.  She is checking home blood sugars.  She says that she is exercising.        Frequency of hypoglycemic symptoms are reported to be seldom.  Other comments include: pt reports that she is not taking any blood sugar meds at this time.  she was taken off all her medications while in assitive living. Pt is adhering to a very strict diet. no fried foods, no fat, no sugar, no carbs.  Pt does cook at home. Hypoglycemia - weak, sweaty about twice per month. keeps peppermint candy in the house for this reason.        Symptoms which suggest diabetic complications include vision problems.  Other questions/concerns include: Pt did get her eyes checked about 6 months ago at [facility]. she has glaucoma.  no drops at this time.  pt knew she was moving so no f/u arranged but she does need to get a local opthomologisdt.  The following changes have been made  to her treatment plan since last visit: medication changes.  Treatment plan changes were initiated by MD.    Hypertension History:      She denies headache, chest pain, and palpitations.  Further comments include: still taking verapamil but notes that she is no longer taking lisinopril due to the side effect of cough.  she does not have medications here with her today and does not know the name of the new medications.        Positive major cardiovascular risk factors include diabetes, hyperlipidemia, and hypertension.  Negative major cardiovascular risk factors include female age less than 53 years old, negative family history for ischemic heart disease, and non-tobacco-user status.        Positive history for target organ damage include ASHD (either angina; prior MI; prior CABG) and prior stroke (or TIA).  Further assessment for target organ damage reveals no history of cardiac end-organ damage (CHF/LVH), peripheral vascular disease, renal insufficiency, or hypertensive retinopathy.          Current Allergies: No known allergies     Risk Factors:  PAP Smear History:     Date of Last PAP Smear:  03/20/2007    Results:  normal per pt   Mammogram History:     Date of Last Mammogram:  03/20/2007    Results:  normal per pt    Review of Systems  ENT      Denies earache.  CV      Denies chest pain or discomfort.  Resp      Denies cough.  Neuro      Complains of headaches.      at times   Physical Exam  General:     alert.   Eyes:     glasses Lungs:     normal breath sounds.   Heart:     normal rate and regular rhythm.   Abdomen:     soft, non-tender, and normal bowel sounds.   Msk:     up to exam table Neurologic:     alert & oriented X3.   Psych:     talkative  Diabetes Management Exam:    Foot Exam (with socks and/or shoes not present):       Sensory-Monofilament:          Left foot: normal          Right foot: normal       Nails:          Left foot: normal          Right foot: normal    Eye Exam:       Eye Exam done elsewhere          Date: 04/20/2007          Results: glaucoma          Done by: Festus Aloe    Impression & Recommendations:  Problem # 1:  DIABETES MELLITUS, TYPE II (ICD-250.00) no current meds diet controlled  hgba1c reviewed with pt today check BS daily before breakfast The following medications were removed from the medication list:    Lisinopril 10 Mg Tabs (Lisinopril) .[name]... 1 by mouth once daily    Glucophage 500 Mg Tabs (Metformin hcl) .[name]... Take 1 tablet by mouth two times a day for diabetes  Her updated medication list for this problem includes:    Adult Aspirin Low Strength 81 Mg Tbdp (Aspirin) .[name]... 1 by mouth qd  Orders: Capillary Blood Glucose (82948) Fingerstick (51884)  Hemoglobin A1C (83036)   Problem # 2:  HYPERLIPIDEMIA (ICD-272.4) will check labs on next visit Her updated medication list for this problem includes:    Simvastatin 20 Mg Tabs (Simvastatin) .[name]... 1 by mouth once daily for  cholesterol   Problem # 3:  HYPERTENSION (ICD-401.9) Assessment: Unchanged need to get medication list to see what other medication pt is taking she needs ACE/ARB for kidney protection The following medications were removed from the medication list:    Lisinopril 10 Mg Tabs (Lisinopril) .[name]... 1 by mouth once daily  Her updated medication list for this problem includes:    Verapamil Hcl Cr 240 Mg Cp24 (Verapamil hcl) .[name]... 1 by mouth once daily   Problem # 4:  SEIZURE DISORDER (ICD-780.39) continue current meds The following medications were removed from the medication list:    Topamax 100 Mg Tabs (Topiramate) .[name]... 1 by mouth q am  Her updated medication list for this problem includes:    Lyrica 150 Mg Caps (Pregabalin) .[name]... 1 capsule by mouth two times a day    Topamax 100 Mg Tabs (Topiramate) .[name]... 2 tablets by mouth two times a day   Problem # 5:  DEPRESSION (ICD-311) Willing to manage meds in office since stable. if changes pt will need to go to the [facility] Her updated medication list for this problem includes:    Fluoxetine Hcl 20 Mg Caps (Fluoxetine hcl) .Marland KitchenMarland KitchenMarland KitchenMarland Kitchen 3 by mouth q am    Trazodone Hcl 150 Mg Tabs (Trazodone hcl) .[name]... 1 tablet by mouth at night   Complete Medication List: 1)  Omeprazole 20 Mg Cpdr (Omeprazole) .... I by mouth once daily 2)  Fluoxetine Hcl 20 Mg Caps (Fluoxetine hcl) .... 3 by mouth q am 3)  Verapamil Hcl Cr 240 Mg Cp24 (Verapamil hcl) .[name].. 1 by mouth once daily 4)  Simvastatin 20 Mg Tabs (Simvastatin) .[name].. 1 by mouth once daily for cholesterol 5)  Plavix 75 Mg Tabs (Clopidogrel bisulfate) .[name].. 1 by mouth once daily for circulation 6)  Adult Aspirin Low Strength 81 Mg Tbdp (Aspirin) .[name].. 1 by mouth qd 7)  Trazodone Hcl 150 Mg Tabs (Trazodone hcl) .[name].. 1 tablet by mouth at night 8)  Darvocet-n 100 100-650 Mg Tabs (Propoxyphene n-apap) .... Q6 prn 9)  Lyrica 150 Mg Caps (Pregabalin) .[name].. 1 capsule by mouth two times a day 10)  Topamax 100  Mg Tabs (Topiramate) .... 2 tablets by mouth two times a day 11)  Flexeril 10 Mg Tabs (Cyclobenzaprine hcl) .[name].. 1 tablet by mouth two times a day for spasms 12)  Sanctura 20 Mg Tabs (Trospium chloride) .[name].. 1 tablet by mouth two times a day for bladder  Diabetes Management Assessment/Plan:      The following lipid goals have been established for the patient: Total cholesterol goal of 200; LDL cholesterol goal of 100; HDL cholesterol goal of 40; Triglyceride goal of 200.  Her blood pressure goal is < 130/80.    Hypertension Assessment/Plan:      The patient's hypertensive risk group is category C: Target organ damage and/or diabetes.  Today's blood pressure is 130/90.  Her blood pressure goal is < 130/80.   Patient Instructions: 1)  Sign up to get flu vaccine.  You need this because you are diabetic. 2)  Call for follow up in 6 weeks for diabetes.  Come fasting to this visit.  Do not eat after midnight.  You will get labs.  You will also need u/a and microalbumin. 3)  Bring all your medication to next visit. 4)  Continue to check blood sugar daily before breakfast.   5)  You need to get an appointment for an eye exam.   ] Laboratory Results   Blood Tests   Date/Time Received: November 14, 2007 10:48 AM   HGBA1C: 6.3%   (Normal Range: Non-Diabetic - 3-6%   Control Diabetic - 6-8%) CBG Random:: 131       Last LDL:                                                 81 (09/21/2006 11:09:00 PM)      Diabetic Foot Exam Foot Inspection Is there a history of a foot ulcer?              No Is there a foot ulcer now?              No Can the patient see the bottom of their feet?          Yes Are the shoes appropriate in style and fit?          Yes Is there swelling or an abnormal foot shape?          No Are the toenails long?                Yes Are the toenails thick?                No Are the toenails ingrown?              No Is there heavy callous build-up?              No Is there  a claw toe deformity?                          No Is there elevated skin temperature?            Yes Is there limited ankle dorsiflexion?            No Is there foot or ankle muscle weakness?            No Do you have pain in calf while walking?           No         10-g (5.07) Semmes-Weinstein Monofilament Test           Right Foot          Left Foot Visual Inspection               Test Control      normal         normal Site 1         normal         normal Site 2         normal         normal Site 3         normal         normal Site 4         normal         normal Site 5         normal         normal Site 6         normal  normal Site 7         normal         normal Site 8         normal         normal Site 9         abnormal         abnormal Site 10         normal         normal  Impression      normal         normal

## 2010-02-18 NOTE — Letter (Signed)
Summary: COMPLAINT/ MUSCLE ACHES  COMPLAINT/ MUSCLE ACHES   Imported By: Roland Earl 03/14/2007 15:56:11  _____________________________________________________________________  External Attachment:    Type:   Image     Comment:   External Document

## 2010-02-18 NOTE — Miscellaneous (Signed)
Summary: Med change - Detrol  Clinical Lists Changes  Medications: Changed medication from [place] 20 MG  TABS (TROSPIUM CHLORIDE) 1 tablet by mouth two times a day for bladder to DETROL LA 4 MG XR24H-CAP (TOLTERODINE TARTRATE) 1 tablet by mouth daily for bladder - Signed Rx of DETROL LA 4 MG XR24H-CAP (TOLTERODINE TARTRATE) 1 tablet by mouth daily for bladder;  #30 x 6;  Signed;  Entered by: Aurora Mask [name];  Authorized by: Aurora Mask [name];  Method used: Printed then faxed to [facility], [address], [place], [place], [place]  [address]  [place], Ph: [phone], Fax: [phone]    Prescriptions: DETROL LA 4 MG XR24H-CAP (TOLTERODINE TARTRATE) 1 tablet by mouth daily for bladder  #30 x 6   Entered and Authorized by:   Aurora Mask [name]   Signed by:   Aurora Mask [name] on 02/01/2008   Method used:   Printed then faxed to ...       [facility] (retail)       [address] [place]       [place], [place]  [address]  [place]       Ph: [phone]       Fax: [phone]   RxID:   [id number]

## 2010-02-18 NOTE — Assessment & Plan Note (Signed)
Summary: cough for long time/incontince /tmm   Vital Signs:  Patient Profile:   45 Years Old Female Height:     64.4 inches Weight:      214 pounds BMI:     36.41 BSA:     2.02 Temp:     97.6 degrees F oral Pulse rate:   88 / minute Pulse rhythm:   regular Resp:     24 per minute BP sitting:   140 / 98  (right arm) Cuff size:   large  Pt. in pain?   yes    Location:   lower back    Intensity:   9    Type:       sharp  Vitals Entered By: Bridgett Larsson RN (September 30, 2006 2:08 PM)              Is Patient Diabetic? Yes  Nutritional Status BMI of > 30 = obese Nutritional Status Detail fasting CBG Result 123 CBG Device ID a  Does patient need assistance? Functional Status Self care Ambulation Normal     Chief Complaint:  Back Pain and cough.  History of Present Illness: Pt presents today with cough.  She reports that the cough has gotten progressively worse to the point to where she is drinking lots of cough syrup.   No fever, no diarrhea, no vomiting, no diarrhea cough is productive at times.  no sore throat or ear pain. occasional headache but she has migraines.  Very little nasal drainage. Pt did come into the office for labs as previously ordered on initial visit.  She however did not do U/A and urine microalbumin. Pt denies any tobacco use.   Diabetes Management History:      She states understanding of dietary principles.  She is checking home blood sugars.  She says that she is exercising.        Symptoms which suggest diabetic complications include vision problems.    Hypertension History:      She denies headache, chest pain, and palpitations.  She notes no problems with any antihypertensive medication side effects.        Positive major cardiovascular risk factors include diabetes, hyperlipidemia, and hypertension.  Negative major cardiovascular risk factors include female age less than 13 years old and non-tobacco-user status.        Positive history  for target organ damage include ASHD (either angina; prior MI; prior CABG).      Current Allergies: No known allergies     Risk Factors:  Tobacco use:  quit    Year quit:  20 yrs Drug use:  no Caffeine use:  0 drinks per day Alcohol use:  no Exercise:  yes Seatbelt use:  100 %   Review of Systems  General      Denies chills, fatigue, and fever.  ENT      Complains of nasal congestion.      Denies decreased hearing, difficulty swallowing, ear discharge, earache, and hoarseness.  CV      Denies chest pain or discomfort.  Resp      Complains of cough.  GI      Denies abdominal pain, constipation, and gas.   Physical Exam  General:     alert.   Head:     glasses Lungs:     scattered rhonchi in right upper lobenormal respiratory effort.   Heart:     normal rate, regular rhythm, no murmur, no gallop, and no rub.   Abdomen:  soft, non-tender, and normal bowel sounds.   Msk:     up to exam table, no limits Neurologic:     alert & oriented X3.      Impression & Recommendations:  Problem # 1:  UPPER RESPIRATORY INFECTION (ICD-465.9) Will start amoxil 556m three times a day x 10 days. Her updated medication list for this problem includes:     Tussionex Pennkinetic Er 8-10 Mg/533mLqcr (Chlorpheniramine-hydrocodone) ...[name]. 1 teaspoon every 12 hours as needed for cough If cough persists after antibiotic course will look to lisinopril, however pt has been on this med for a while.    Problem # 2:  DIABETES MELLITUS, TYPE II (ICD-250.00) Hgba1c stable on last visit. Her updated medication list for this problem includes:    Metformin Hcl 500 Mg Tabs (Metformin hcl) ...[name]. Marland Kitchenwo times a day     Orders: Capillary Blood Glucose (82948) Fingerstick (3(35361UA Dipstick w/o Micro (81002) Urine Microalbumin (8(44315  Problem # 3:  HYPERTENSION (ICD-401.9) Will continue to monitor. Slightly elevated today but may be due to acute illness. Her updated medication  list for this problem includes:    Lisinopril 10 Mg Tabs (Lisinopril) ...[name]. 1 by mouth once daily    Verapamil Hcl Cr 240 Mg Cp24 (Verapamil hcl) ...[name]. 1 by mouth once daily   Complete Medication List: 1)  Metformin Hcl 500 Mg Tabs (Metformin hcl) .... Two times a day 2)  Lisinopril 10 Mg Tabs (Lisinopril) ...[name] 1 by mouth once daily 3)  Omeprazole 20 Mg Cpdr (Omeprazole) .... I by mouth once daily 4)  Fluoxetine Hcl 20 Mg Caps (Fluoxetine hcl) .... 3 by mouth q am 5)  Verapamil Hcl Cr 240 Mg Cp24 (Verapamil hcl) ...[name] 1 by mouth once daily 6)  Simvastatin 20 Mg Tabs (Simvastatin) ...[name] 1 by mouth once daily 7)  Plavix 75 Mg Tabs (Clopidogrel bisulfate) ...[name] 1 by mouth once daily 8)  Adult Aspirin Low Strength 81 Mg Tbdp (Aspirin) ...[name] 1 by mouth qd 9)  Trazodone Hcl 100 Mg Tabs (Trazodone hcl) ...[name] 1 by mouth qhs 10)  Darvocet-n 100 100-650 Mg Tabs (Propoxyphene n-apap) .... Q6 prn 11)  Lyrica 100 Mg Caps (Pregabalin) ...[name] 1 by mouth two times a day 12)  Topamax 100 Mg Tabs (Topiramate) ...[name] 1 by mouth q am 13)  Topamax 100 Mg Tabs (Topiramate) .... 2 by mouth  q hs 14)  Amoxil 500 Mg Caps (Amoxicillin) ...[name] 1 tablet by mouth three times a day for infection 15)  Tussionex Pennkinetic Er 8-10 Mg/70m99mqcr (Chlorpheniramine-hydrocodone) ....[name]1 teaspoon every 12 hours as needed for cough  Diabetes Management Assessment/Plan:      The following lipid goals have been established for the patient: Total cholesterol goal of 200; LDL cholesterol goal of 100; HDL cholesterol goal of 40; Triglyceride goal of 200.  Her blood pressure goal is < 130/80.    Hypertension Assessment/Plan:      The patient's hypertensive risk group is category C: Target organ damage and/or diabetes.  Today's blood pressure is 140/98.  Her blood pressure goal is < 130/80. Pt also taking Flexeril 81m61mby mouth .two times a day  and sanctura 20mg23my mouth .two times a day   Patient Instructions: 1)  Will check EKG as  planned on next visit.    Prescriptions: TUSSIONEX PENNKINETIC ER 8-10 MG/5ML  LQCR (CHLORPHENIRAMINE-HYDROCODONE) 1 teaspoon every 12 hours as needed for cough  #4 ounce x 0   Entered and  Authorized by:   Aurora Mask [name]   Signed by:   Aurora Mask [name] on 09/30/2006   Method used:   Print then Give to Patient   RxID:   [id number] AMOXIL 500 MG  CAPS (AMOXICILLIN) 1 tablet by mouth three times a day for infection  #30 x 0   Entered and Authorized by:   Aurora Mask [name]   Signed by:   Aurora Mask [name] on 09/30/2006   Method used:   Print then Give to Patient   RxID:   [id number]  ] Laboratory Results   Urine Tests  Date/Time Received: September 30, 2006 3:29 PM  Date/Time Reported: September 30, 2006 3:29 PM   Routine Urinalysis   Color: lt. yellow Appearance: Cloudy Glucose: negative   (Normal Range: Negative) Bilirubin: negative   (Normal Range: Negative) Ketone: negative   (Normal Range: Negative) Spec. Gravity: 1.020   (Normal Range: 1.003-1.035) Blood: negative   (Normal Range: Negative) pH: 6.5   (Normal Range: 5.0-8.0) Protein: negative   (Normal Range: Negative) Urobilinogen: 0.2   (Normal Range: 0-1) Nitrite: negative   (Normal Range: Negative) Leukocyte Esterace: negative   (Normal Range: Negative)     Blood Tests     CBG Random:: 403

## 2010-02-18 NOTE — Progress Notes (Signed)
Summary: FL 2 form  Phone Note Call from Patient Call back at [facility] Phone [phone]   Caller: Patient Summary of Call: The patient just drop off a FL 2 form for her provider to filled out. The patient needs this form ready by tomorrow if that is possible. [name] Hassell Done. Initial call taken by: Alexis Goodell,  October 26, 2006 3:51 PM  Follow-up for Phone Call        pt came in to get fl2 form.  Upset that it was not done yet.  Wanted form back to take to another doctor.  Form given back to pt. Follow-up by: Shellia Carwin CMA,  October 27, 2006 4:32 PM

## 2010-10-03 LAB — PULMONARY FUNCTION TEST

## 2010-10-30 LAB — URINALYSIS, ROUTINE W REFLEX MICROSCOPIC
Bilirubin Urine: NEGATIVE
Glucose, UA: NEGATIVE
Hgb urine dipstick: NEGATIVE
Ketones, ur: NEGATIVE
Nitrite: NEGATIVE
Protein, ur: NEGATIVE
Specific Gravity, Urine: 1.024
Urobilinogen, UA: 1
pH: 6

## 2010-10-30 LAB — COMPREHENSIVE METABOLIC PANEL
ALT: 14
AST: 11
Albumin: 3.1 — ABNORMAL LOW
Alkaline Phosphatase: 47
BUN: 9
CO2: 21
Calcium: 8.8
Chloride: 108
Creatinine, Ser: 0.86
GFR calc Af Amer: >60
GFR calc non Af Amer: >60
Glucose, Bld: 119 — ABNORMAL HIGH
Potassium: 3.6
Sodium: 134 — ABNORMAL LOW
Total Bilirubin: 0.6
Total Protein: 6.4

## 2010-10-30 LAB — URINE MICROSCOPIC-ADD ON

## 2010-10-30 LAB — POCT PREGNANCY, URINE
Operator id: 261881
Preg Test, Ur: NEGATIVE

## 2010-10-30 LAB — DIFFERENTIAL
Basophils Absolute: 0
Basophils Relative: 0
Eosinophils Absolute: 0
Eosinophils Relative: 0
Lymphocytes Relative: 8 — ABNORMAL LOW
Lymphs Abs: 1.4
Monocytes Absolute: 0.8 — ABNORMAL HIGH
Monocytes Relative: 4
Neutro Abs: 15.1 — ABNORMAL HIGH
Neutrophils Relative %: 88 — ABNORMAL HIGH

## 2010-10-30 LAB — CBC
HCT: 33.2 — ABNORMAL LOW
Hemoglobin: 11.1 — ABNORMAL LOW
MCHC: 33.4
MCV: 93.1
Platelets: 229
RBC: 3.57 — ABNORMAL LOW
RDW: 14.2 — ABNORMAL HIGH
WBC: 17.2 — ABNORMAL HIGH

## 2010-10-30 LAB — LIPASE, BLOOD: Lipase: 12

## 2011-02-19 ENCOUNTER — Ambulatory Visit (INDEPENDENT_AMBULATORY_CARE_PROVIDER_SITE_OTHER): Payer: PRIVATE HEALTH INSURANCE | Admitting: General Surgery

## 2011-02-19 ENCOUNTER — Encounter (INDEPENDENT_AMBULATORY_CARE_PROVIDER_SITE_OTHER): Payer: Self-pay | Admitting: General Surgery

## 2011-02-19 VITALS — BP 126/78 | HR 70 | Temp 97.8°F | Resp 18 | Ht 67.0 in | Wt 261.4 lb

## 2011-02-19 DIAGNOSIS — K429 Umbilical hernia without obstruction or gangrene: Secondary | ICD-10-CM

## 2011-02-19 NOTE — Progress Notes (Signed)
Patient ID: [name], female   DOB: 06/03/1965, 46 y.o.   MRN: [id number]  Chief Complaint  Patient presents with  . Umbilical Hernia    HPI [name] is a 46 y.o. female.  Referred by Dr. Nolene Ebbs HPI This is a 27 short female with multiple medical problems including difficult to control diabetes, history of myocardial infarction, and history of a stroke in 2003. She also has a seizure disorder for which last seizure she had was about one year ago. She was in an assisted living facility and is now living with her daughter. She has had her sugars attempted to be better controlled since then as well. There still in the 200-250.She lost some weight recently also. Over the last several years she has developed an increasingly tender, larger umbilical hernia. This area is significantly tender at this point it is causing her to not be able to do certain things. He very rarely reduces on its own she reports. She does have some nausea and vomiting I'm not sure this is related to her hernia. She also has 2 small tender masses underneath the skin of her right knee she would like evaluated. Her umbilical hernias aggravated by coughing or doing any  activity really. It is relieved somewhat by lying down.  Past Medical History  Diagnosis Date  . Umbilical hernia   . Hypertension   . Diabetes mellitus   . Asthma   . Seizures   . Nephrolithiasis   . Stroke     last 2003    Past Surgical History  Procedure Date  . Kidney stone removal   . Brain hematoma evacutation     five procedures total, first procedure when 52 months old, last at 46 years of age.  . Vaginal hysterectomy     Family History  Problem Relation Age of Onset  . Cancer Mother     uterine  . Cancer Maternal Grandmother     brain    Social History History  Substance Use Topics  . Smoking status: Never Smoker   . Smokeless tobacco: Never Used  . Alcohol Use: No    Allergies  Allergen Reactions  . Codeine  Swelling and Other (See Comments)    Swelling and burning of mouth (inside)    Current Outpatient Prescriptions  Medication Sig Dispense Refill  . albuterol (PROVENTIL HFA;VENTOLIN HFA) 108 (90 BASE) MCG/ACT inhaler Inhale 2 puffs into the lungs every 6 (six) hours as needed.      . baclofen (LIORESAL) 10 MG tablet Take 10 mg by mouth 3 (three) times daily.      . budesonide-formoterol (SYMBICORT) 160-4.5 MCG/ACT inhaler Inhale 2 puffs into the lungs 2 (two) times daily.      . clopidogrel (PLAVIX) 75 MG tablet Take 75 mg by mouth daily.      [name] Dextromethorphan-Guaifenesin (CORICIDIN HBP CONGESTION/COUGH PO) Take by mouth as needed.      [name] FLUTICASONE PROPIONATE NA Place 50 mcg into the nose.      . gabapentin (NEURONTIN) 300 MG capsule Take 300 mg by mouth 3 (three) times daily.      [name] glipiZIDE (GLUCOTROL XL) 10 MG 24 hr tablet Take 10 mg by mouth daily.      [name] losartan (COZAAR) 50 MG tablet Take 50 mg by mouth daily.      . pregabalin (LYRICA) 150 MG capsule Take 150 mg by mouth 2 (two) times daily.      [name] topiramate (TOPAMAX)  200 MG tablet Take 200 mg by mouth 2 (two) times daily.      . traZODone (DESYREL) 50 MG tablet Take 50 mg by mouth at bedtime.        Review of Systems Review of Systems  Blood pressure 126/78, pulse 70, temperature 97.8 F (36.6 C), temperature source Temporal, resp. rate 18, height 5' 7"  (1.702 m), weight 261 lb 6.4 oz (118.57 kg).  Physical Exam Physical Exam  Vitals reviewed. Constitutional: She appears well-developed and well-nourished.  Eyes: No scleral icterus.  Neck: Neck supple.  Cardiovascular: Normal rate, regular rhythm and normal heart sounds.   Pulmonary/Chest: Effort normal and breath sounds normal. She has no wheezes. She has no rales.  Abdominal: Soft. Normal appearance and bowel sounds are normal. There is tenderness in the periumbilical area. A hernia (tender difficult to reduce umbilical hernia) is present.    Musculoskeletal:        Legs: Lymphadenopathy:    She has no cervical adenopathy.    Data Reviewed Notes from PCP  Assessment    1. Tender UH  2. Right knee subq masses Plan    I am concerned by her multiple medical problems as well as the fact that her sugars are not well controlled. Her umbilical hernia however is quite tender. It is difficult to reduce at this point also. This is an incisional hernia she appears to have a laparoscopic assisted vaginal hysterectomy. We discussed all of her options today. She would also like to have 2 right knee subcutaneous masses that appear to be benign excised as well. These cause her some pain. We discussed incision and removing a subcutaneous masses.  I discussed all of her options for the hernia. I Tthink that the best option is going to be to get her sugars under little bit better control over the next few weeks due to risk of infection. I also will need her to be off of her Plavix. I would be willing to leave her on an 81 mg of aspirin throughout the surgery due to her risk of stroke. I discussed a laparoscopic ventral hernia repair with mesh. I think this would be better than an open repair due to the fact that he is incisional as well as decreasing the risk of infection. I think this will prolong her hospital stay may make her recovery little bit longer but this is likely the option with the least amount of risk for her. We discussed a laparoscopic repair with mesh. The risks include but are not limited to bleeding, infection, recurrence, injury to surrounding structures, as well as medical risks including myocardial infarction and stroke. I will plan on scheduling her in 3 weeks and let her regular doctor know that this is the case or fluid we can get some recommendations on her medications as well as her sugars under little bit better control.       Oretha Weismann 02/19/2011, 11:50 AM

## 2011-02-25 ENCOUNTER — Encounter (HOSPITAL_COMMUNITY): Payer: Self-pay | Admitting: Pharmacy Technician

## 2011-02-26 ENCOUNTER — Telehealth (INDEPENDENT_AMBULATORY_CARE_PROVIDER_SITE_OTHER): Payer: Self-pay

## 2011-02-26 NOTE — Telephone Encounter (Signed)
LMOM for Dr Avbuere's office to call me back b/c trying to get perioperative recommendations about pt for surgery with Dr Donne Hazel.

## 2011-02-27 ENCOUNTER — Telehealth (INDEPENDENT_AMBULATORY_CARE_PROVIDER_SITE_OTHER): Payer: Self-pay

## 2011-02-27 NOTE — Telephone Encounter (Signed)
2nd message I left for the nurse to call me again b/c pt is scheduled for sx on 03-09-11 and I need some perioperative recommendations about the Plavix.

## 2011-03-03 ENCOUNTER — Telehealth (INDEPENDENT_AMBULATORY_CARE_PROVIDER_SITE_OTHER): Payer: Self-pay

## 2011-03-03 ENCOUNTER — Encounter (HOSPITAL_COMMUNITY)
Admission: RE | Admit: 2011-03-03 | Discharge: 2011-03-03 | Disposition: A | Discharge status: Home / Self Care | Payer: PRIVATE HEALTH INSURANCE | Source: Ambulatory Visit | Attending: General Surgery | Admitting: General Surgery

## 2011-03-03 ENCOUNTER — Telehealth (INDEPENDENT_AMBULATORY_CARE_PROVIDER_SITE_OTHER): Payer: Self-pay | Admitting: General Surgery

## 2011-03-03 ENCOUNTER — Ambulatory Visit (HOSPITAL_COMMUNITY)
Admission: RE | Admit: 2011-03-03 | Discharge: 2011-03-03 | Disposition: A | Discharge status: Home / Self Care | Payer: PRIVATE HEALTH INSURANCE | Source: Ambulatory Visit | Attending: General Surgery | Admitting: General Surgery

## 2011-03-03 ENCOUNTER — Encounter (HOSPITAL_COMMUNITY): Payer: Self-pay

## 2011-03-03 DIAGNOSIS — Z01818 Encounter for other preprocedural examination: Secondary | ICD-10-CM | POA: Insufficient documentation

## 2011-03-03 DIAGNOSIS — Z01812 Encounter for preprocedural laboratory examination: Secondary | ICD-10-CM | POA: Insufficient documentation

## 2011-03-03 DIAGNOSIS — I10 Essential (primary) hypertension: Secondary | ICD-10-CM | POA: Insufficient documentation

## 2011-03-03 DIAGNOSIS — K432 Incisional hernia without obstruction or gangrene: Secondary | ICD-10-CM | POA: Insufficient documentation

## 2011-03-03 HISTORY — DX: Polyneuropathy, unspecified: G62.9

## 2011-03-03 HISTORY — DX: Gastro-esophageal reflux disease without esophagitis: K21.9

## 2011-03-03 HISTORY — DX: Acute myocardial infarction, unspecified: I21.9

## 2011-03-03 HISTORY — DX: Pneumonia, unspecified organism: J18.9

## 2011-03-03 HISTORY — DX: Cardiac arrhythmia, unspecified: I49.9

## 2011-03-03 HISTORY — DX: Headache: R51

## 2011-03-03 HISTORY — DX: Dizziness and giddiness: R42

## 2011-03-03 HISTORY — DX: Legal blindness, as defined in USA: H54.8

## 2011-03-03 HISTORY — DX: Unspecified osteoarthritis, unspecified site: M19.90

## 2011-03-03 HISTORY — DX: Personal history of other venous thrombosis and embolism: Z86.718

## 2011-03-03 HISTORY — DX: Cardiac murmur, unspecified: R01.1

## 2011-03-03 HISTORY — DX: Chronic obstructive pulmonary disease, unspecified: J44.9

## 2011-03-03 LAB — BASIC METABOLIC PANEL
BUN: 11 mg/dL (ref 6–23)
CO2: 24 mEq/L (ref 19–32)
Calcium: 9.7 mg/dL (ref 8.4–10.5)
Chloride: 104 mEq/L (ref 96–112)
Creatinine, Ser: 0.77 mg/dL (ref 0.50–1.10)
GFR calc Af Amer: >90 mL/min (ref >90)
GFR calc non Af Amer: >90 mL/min (ref >90)
Glucose, Bld: 308 mg/dL — ABNORMAL HIGH (ref 70–99)
Potassium: 3.9 mEq/L (ref 3.5–5.1)
Sodium: 139 mEq/L (ref 135–145)

## 2011-03-03 LAB — URINALYSIS, ROUTINE W REFLEX MICROSCOPIC
Bilirubin Urine: NEGATIVE
Glucose, UA: >1000 mg/dL — AB
Hgb urine dipstick: NEGATIVE
Ketones, ur: 15 mg/dL — AB
Nitrite: NEGATIVE
Protein, ur: NEGATIVE mg/dL
Specific Gravity, Urine: 1.039 — ABNORMAL HIGH (ref 1.005–1.030)
Urobilinogen, UA: 1 mg/dL (ref 0.0–1.0)
pH: 7.5 (ref 5.0–8.0)

## 2011-03-03 LAB — SURGICAL PCR SCREEN
MRSA, PCR: NEGATIVE
Staphylococcus aureus: NEGATIVE

## 2011-03-03 LAB — CBC
HCT: 41.3 % (ref 36.0–46.0)
Hemoglobin: 13.2 g/dL (ref 12.0–15.0)
MCH: 28.9 pg (ref 26.0–34.0)
MCHC: 32 g/dL (ref 30.0–36.0)
MCV: 90.6 fL (ref 78.0–100.0)
Platelets: 273 10*3/uL (ref 150–400)
RBC: 4.56 MIL/uL (ref 3.87–5.11)
RDW: 13.8 % (ref 11.5–15.5)
WBC: 7.2 10*3/uL (ref 4.0–10.5)

## 2011-03-03 LAB — URINE MICROSCOPIC-ADD ON

## 2011-03-03 NOTE — Telephone Encounter (Signed)
Received message in my voicemail from patient stating she got a call from the office about her glucose and she has an appt with her PCP on Thursday 03/05/11 @ 10:45am.

## 2011-03-03 NOTE — Telephone Encounter (Signed)
Called pt to notify her that her labs for sx on 03-09-11 came back abnormal of glucose being 308 with an abnormal UA showing glucose in the urine. I did advise her that if she didn't get her sugars under better control for surgery that if this came back abnormal on the day of sx that Dr Donne Hazel would cancel her surgery. I asked the pt if she has been in contact with her doctor and she said she has an appt with them on 03-13-11. I told the pt that really doesn't help [name] b/c the appt is after the sx. The pt told me she has sugars under control at home. I told her that I will keep trying to call Dr Avbuere's office but I have not had any luck with them returning my call's b/c I've been trying to reach someone since 02-19-11. I will call the pt back.

## 2011-03-03 NOTE — Patient Instructions (Signed)
[place]  03/03/2011   Your procedure is scheduled on:  03/09/11 1000am-1200noon  Report to [facility] at 0800 AM.  Call this number if you have problems the morning of surgery: [phone]   Remember:   Do not eat food:After Midnight.  May have clear liquids:until Midnight .    Take these medicines the morning of surgery with A SIP OF WATER:    Do not wear jewelry, make-up or nail polish.  Do not wear lotions, powders, or perfumes.   Do not shave 48 hours prior to surgery.  Do not bring valuables to the hospital.  Contacts, dentures or bridgework may not be worn into surgery.  Leave suitcase in the car. After surgery it may be brought to your room.  For patients admitted to the hospital, checkout time is 11:00 AM the day of discharge.     Special Instructions: CHG Shower Use Special Wash: 1/2 bottle night before surgery and 1/2 bottle morning of surgery. Shower chin to toes with CHG.  Wash face and private parts with regular soap.     Please read over the following fact sheets that you were given: MRSA Information, coughing and deep breathing exercises, leg exercises

## 2011-03-03 NOTE — Pre-Procedure Instructions (Signed)
03/03/11 [name] made aware of glucose of 308 on preop labs and abnormal u/a results.

## 2011-03-03 NOTE — Pre-Procedure Instructions (Signed)
11/06/10 Sleep study report on chart  1/13 EKG on chart

## 2011-03-04 ENCOUNTER — Emergency Department (HOSPITAL_COMMUNITY)
Admission: EM | Admit: 2011-03-04 | Discharge: 2011-03-05 | Disposition: A | Discharge status: Home / Self Care | Payer: PRIVATE HEALTH INSURANCE | Attending: Emergency Medicine | Admitting: Emergency Medicine

## 2011-03-04 ENCOUNTER — Encounter (HOSPITAL_COMMUNITY): Payer: Self-pay | Admitting: *Deleted

## 2011-03-04 DIAGNOSIS — E119 Type 2 diabetes mellitus without complications: Secondary | ICD-10-CM | POA: Insufficient documentation

## 2011-03-04 DIAGNOSIS — J4489 Other specified chronic obstructive pulmonary disease: Secondary | ICD-10-CM | POA: Insufficient documentation

## 2011-03-04 DIAGNOSIS — R739 Hyperglycemia, unspecified: Secondary | ICD-10-CM

## 2011-03-04 DIAGNOSIS — J449 Chronic obstructive pulmonary disease, unspecified: Secondary | ICD-10-CM | POA: Insufficient documentation

## 2011-03-04 DIAGNOSIS — I252 Old myocardial infarction: Secondary | ICD-10-CM | POA: Insufficient documentation

## 2011-03-04 DIAGNOSIS — I10 Essential (primary) hypertension: Secondary | ICD-10-CM | POA: Insufficient documentation

## 2011-03-04 DIAGNOSIS — Z79899 Other long term (current) drug therapy: Secondary | ICD-10-CM | POA: Insufficient documentation

## 2011-03-04 DIAGNOSIS — K219 Gastro-esophageal reflux disease without esophagitis: Secondary | ICD-10-CM | POA: Insufficient documentation

## 2011-03-04 DIAGNOSIS — Z794 Long term (current) use of insulin: Secondary | ICD-10-CM | POA: Insufficient documentation

## 2011-03-04 DIAGNOSIS — Z8673 Personal history of transient ischemic attack (TIA), and cerebral infarction without residual deficits: Secondary | ICD-10-CM | POA: Insufficient documentation

## 2011-03-04 DIAGNOSIS — M129 Arthropathy, unspecified: Secondary | ICD-10-CM | POA: Insufficient documentation

## 2011-03-04 LAB — CBC
HCT: 40.1 % (ref 36.0–46.0)
Hemoglobin: 13.1 g/dL (ref 12.0–15.0)
MCH: 29.5 pg (ref 26.0–34.0)
MCHC: 32.7 g/dL (ref 30.0–36.0)
MCV: 90.3 fL (ref 78.0–100.0)
Platelets: 256 10*3/uL (ref 150–400)
RBC: 4.44 MIL/uL (ref 3.87–5.11)
RDW: 13.8 % (ref 11.5–15.5)
WBC: 7.4 10*3/uL (ref 4.0–10.5)

## 2011-03-04 LAB — BASIC METABOLIC PANEL
BUN: 14 mg/dL (ref 6–23)
CO2: 22 mEq/L (ref 19–32)
Calcium: 9.7 mg/dL (ref 8.4–10.5)
Chloride: 104 mEq/L (ref 96–112)
Creatinine, Ser: 0.79 mg/dL (ref 0.50–1.10)
GFR calc Af Amer: >90 mL/min (ref >90)
GFR calc non Af Amer: >90 mL/min (ref >90)
Glucose, Bld: 314 mg/dL — ABNORMAL HIGH (ref 70–99)
Potassium: 4 mEq/L (ref 3.5–5.1)
Sodium: 137 mEq/L (ref 135–145)

## 2011-03-04 LAB — GLUCOSE, CAPILLARY
Glucose-Capillary: 250 mg/dL — ABNORMAL HIGH (ref 70–99)
Glucose-Capillary: 245 mg/dL — ABNORMAL HIGH (ref 70–99)

## 2011-03-04 MED ORDER — SODIUM CHLORIDE 0.9 % IV BOLUS (SEPSIS)
1000.0000 mL | Freq: Once | INTRAVENOUS | Status: AC
  Administered 2011-03-04: 1000 mL via INTRAVENOUS

## 2011-03-04 NOTE — ED Notes (Signed)
Pt unable to catch urine in urine cup.

## 2011-03-05 ENCOUNTER — Telehealth (INDEPENDENT_AMBULATORY_CARE_PROVIDER_SITE_OTHER): Payer: Self-pay

## 2011-03-05 NOTE — Telephone Encounter (Signed)
Dr Jeanie Cooks called Dr Donne Hazel and they spoke about the pt. The pt is supposed to see them again next week and Dr Donne Hazel did ask for DR Avbuere to call him once he see's the pt so he can make his final decision.

## 2011-03-05 NOTE — Telephone Encounter (Signed)
Called pt to let her know that we still haven't spoke to the DR Avbuere about her having surgery next week. The pt did see Dr Jeanie Cooks yesterday and when she arrived to the office her sugars were 330 so they sent her to the ER. The pt got evaluated at the ER and medicince got changed. The pt left the ER with her sugars at 244. I advised pt that Dr Donne Hazel wants to push her surgery out for 2wks until the sugars are at least 200 or lower b/c she runs a high rish of having infections after surgery. I told the pt she needed to see her medical doctor again next week and she said she has an appt with them but she couldn't remember the appt. The pt will call their office to find out the appt. I advised the pt that they need to call Dr Donne Hazel next week when she is seen so we can find out how she is doing. The pt understands.

## 2011-03-05 NOTE — ED Provider Notes (Signed)
History     CSN: [id number]  Arrival date & time 03/04/11  4270   First MD Initiated Contact with Patient 03/04/11 2107      Chief Complaint  Patient presents with  . Hyperglycemia    pt was sent by PCP for admission due to uncontrolled blood sugar. pt reports CBG at PCP office 330. pt reports "I don't feel good and my sugars are not supposed to be higher than 250."     (Consider location/radiation/quality/duration/timing/severity/associated sxs/prior treatment) HPI Patient presents with complaint of elevated blood sugar. She states that her blood sugars been running in the 200s and 300s recently. She has been evaluated for a surgery recently and is concerned because the surgeon will not agree to surgery unless her blood sugar is below 250. She was presenting for an appointment with her primary Dr. this afternoon her blood sugar was found to be 3:30 she states she was referred to the ED for further evaluation. She denies any recent fevers. She's had no abdominal pain or vomiting. She denies any history of DKA. She states she's been drinking fluids normally and taking her Lantus and NovoLog as prescribed. Her one dose has been increased over the past several weeks in an attempt to better control blood sugars. There no other associated systemic symptoms. There are no alleviating or modifying factors.  Past Medical History  Diagnosis Date  . Umbilical hernia   . Hypertension   . Diabetes mellitus   . Asthma   . Seizures   . Stroke     last 2003  . Heart murmur     born with   . Dysrhythmia     skipped beats  . Myocardial infarction     hi of x 2  last one 2003   . COPD (chronic obstructive pulmonary disease)     chronic bronchitis   . Pneumonia     hx of 2009  . GERD (gastroesophageal reflux disease)   . Nephrolithiasis     frequent urination , urination at nite   . Glaucoma   . Legally blind     left eye   . Headache     migraines   . Neuropathy     due to diabetes   .  Dizziness     secondary to diabetes and hypertension   . Arthritis     joint pain   . Hx of blood clots     hematomas removed from left side of brain from 22mo to 89yrold     Past Surgical History  Procedure Date  . Kidney stone removal   . Brain hematoma evacutation     five procedures total, first procedure when [age]onths old, last at [age]ears of age.  . Vaginal hysterectomy   . Shunt removal     shunt inserted at age 60 74emoved at age 46   Family History  Problem Relation Age of Onset  . Cancer Mother     uterine  . Cancer Maternal Grandmother     brain    History  Substance Use Topics  . Smoking status: Former Smoker    Quit date: 01/20/1987  . Smokeless tobacco: Never Used  . Alcohol Use: No    OB History    Grav Para Term Preterm Abortions TAB SAB Ect Mult Living                  Review of Systems ROS reviewed and otherwise negative except  for mentioned in HPI  Allergies  Codeine  Home Medications   Current Outpatient Rx  Name Route Sig Dispense Refill  . ALBUTEROL SULFATE HFA 108 (90 BASE) MCG/ACT IN AERS Inhalation Inhale 2 puffs into the lungs every 6 (six) hours as needed. Wheezing     . BACLOFEN 10 MG PO TABS Oral Take 10 mg by mouth 3 (three) times daily.    . BUDESONIDE-FORMOTEROL FUMARATE 160-4.5 MCG/ACT IN AERO Inhalation Inhale 2 puffs into the lungs 2 (two) times daily.    [name] CLOPIDOGREL BISULFATE 75 MG PO TABS Oral Take 75 mg by mouth every morning.     . CORICIDIN HBP CONGESTION/COUGH PO Oral Take 1 tablet by mouth 3 (three) times daily as needed. Cough     . FLUTICASONE PROPIONATE NA Nasal Place 1 spray into the nose daily.     [name] GABAPENTIN 300 MG PO CAPS Oral Take 300 mg by mouth 3 (three) times daily.    [name] GLIPIZIDE ER 10 MG PO TB24 Oral Take 20 mg by mouth daily.     [name] HYOSCYAMINE SULFATE 0.125 MG SL SUBL Sublingual Place 0.125-0.25 mg under the tongue every 4 (four) hours as needed. Abdominal spasms.    . INSULIN DETEMIR 100  UNIT/ML [place] SOLN Subcutaneous Inject 25 Units into the skin at bedtime.    . INSULIN LISPRO (HUMAN) 100 UNIT/ML [place] SOLN Subcutaneous Inject 2-10 Units into the skin 4 (four) times daily. Sliding scale    . LOSARTAN POTASSIUM 50 MG PO TABS Oral Take 50 mg by mouth every morning.     [name] OMEPRAZOLE 20 MG PO CPDR Oral Take 20 mg by mouth daily.    [name] PREGABALIN 150 MG PO CAPS Oral Take 150 mg by mouth 2 (two) times daily.    . TOPIRAMATE 200 MG PO TABS Oral Take 200 mg by mouth 2 (two) times daily.    . TRAZODONE HCL 50 MG PO TABS Oral Take 150 mg by mouth at bedtime.     [name] VERAPAMIL HCL ER (CO) 240 MG PO TB24 Oral Take 240 mg by mouth at bedtime.      BP 138/70  Pulse 74  Temp(Src) 97.7 F (36.5 C) (Oral)  Resp 16  Wt 255 lb (115.667 kg)  SpO2 100% Vitals reviewed Physical Exam Physical Examination: General appearance - alert, well appearing, and in no distress Mental status - alert, oriented to person, place, and time Mouth - mucous membranes moist, pharynx normal without lesions Chest - clear to auscultation, no wheezes, rales or rhonchi, symmetric air entry Heart - normal rate, regular rhythm, normal S1, S2, no murmurs, rubs, clicks or gallops Abdomen - soft, nontender, nondistended, no masses or organomegaly Musculoskeletal - no joint tenderness, deformity or swelling Extremities - peripheral pulses normal, no pedal edema, no clubbing or cyanosis Skin - normal coloration and turgor, no rashes  ED Course  Procedures (including critical care time)  Labs Reviewed  GLUCOSE, CAPILLARY - Abnormal; Notable for the following:    Glucose-Capillary 250 (*)    All other components within normal limits  BASIC METABOLIC PANEL - Abnormal; Notable for the following:    Glucose, Bld 314 (*)    All other components within normal limits  GLUCOSE, CAPILLARY - Abnormal; Notable for the following:    Glucose-Capillary 245 (*)    All other components within normal limits  CBC  LAB REPORT - SCANNED    No results found.   1. Hyperglycemia  MDM  Patient presenting with elevated blood sugar. Her initial glucose upon arrival to the ED was 250. Her laboratory evaluation was unremarkable with the exception of mild hyperglycemia. She was given IV hydration with normal saline. She is not in DKA. She appears nontoxic and well-hydrated. Long discussion with patient about insulin adjustments and how the system was necessary to achieve good blood sugar control. Patient advised to call her primary doctor tomorrow to arrange for outpatient followup. She was given strict return precautions and discharged in stable condition. She is agreeable with the plan for discharge.        Threasa Beards, MD 03/06/11 [phone]

## 2011-03-05 NOTE — Discharge Instructions (Signed)
Return to the ED with any concerns including fevers, vomiting and not able to keep down liquids, abdominal pain, decreased level of alertness or lethargy, or any other alarming symptoms.

## 2011-03-05 NOTE — Telephone Encounter (Signed)
Called their office again today to see about the pt being seen today by DR Avbuere. The office said the pt was seen yesterday and I asked if DR Avbuere would speak with Dr Donne Hazel. Dr Donne Hazel waited on hold for 45mn. And he hung up the phone. Per DR WDonne Hazelthe pt needs to r/s her surgery from 03-09-11 until another 2wks to get her sugars under control. I will call the pt.

## 2011-03-09 ENCOUNTER — Encounter (HOSPITAL_COMMUNITY): Payer: Self-pay | Admitting: Pharmacy Technician

## 2011-03-09 ENCOUNTER — Encounter (HOSPITAL_COMMUNITY): Admission: RE | Payer: Self-pay | Source: Ambulatory Visit

## 2011-03-09 ENCOUNTER — Inpatient Hospital Stay (HOSPITAL_COMMUNITY)
Admission: RE | Admit: 2011-03-09 | Payer: PRIVATE HEALTH INSURANCE | Source: Ambulatory Visit | Admitting: General Surgery

## 2011-03-09 SURGERY — REPAIR, HERNIA, INCISIONAL, LAPAROSCOPIC
Anesthesia: General | Laterality: Right

## 2011-03-10 ENCOUNTER — Telehealth (INDEPENDENT_AMBULATORY_CARE_PROVIDER_SITE_OTHER): Payer: Self-pay

## 2011-03-10 NOTE — Telephone Encounter (Signed)
Dr Jeanie Cooks called into office stating that he did see pt today and her sugars are still not under control. The sugars are still in the 300's and fasting 200's. Dr Jeanie Cooks will see the pt again next week and keep [name] updated on her issues. I did advise that we would cancel the pt's surgery for now b/c Dr Donne Hazel told the pt her sugars have to be under control before he would do surgery. I advised Dr Jeanie Cooks that we would wait till he gives [name] the clearance on the pt to get her surgery r/s b/c it's too risky for her to have sx now.

## 2011-03-10 NOTE — Telephone Encounter (Signed)
LMOM for pt to call me so I can go over about canceling her surgery after speaking with Dr Jeanie Cooks and Dr Donne Hazel.

## 2011-03-19 ENCOUNTER — Telehealth (INDEPENDENT_AMBULATORY_CARE_PROVIDER_SITE_OTHER): Payer: Self-pay

## 2011-03-19 NOTE — Telephone Encounter (Signed)
Called pt to check on her with her blood sugars. The pt is still working with Dr Jeanie Cooks to get her sugars under control. The pt will have Dr Jeanie Cooks to call our office when she is cleared for sx.

## 2011-03-23 ENCOUNTER — Encounter (HOSPITAL_COMMUNITY): Admission: RE | Payer: Self-pay | Source: Ambulatory Visit

## 2011-03-23 ENCOUNTER — Ambulatory Visit (HOSPITAL_COMMUNITY)
Admission: RE | Admit: 2011-03-23 | Payer: PRIVATE HEALTH INSURANCE | Source: Ambulatory Visit | Admitting: General Surgery

## 2011-03-23 SURGERY — REPAIR, HERNIA, INCISIONAL, LAPAROSCOPIC
Anesthesia: General | Site: Knee | Laterality: Right

## 2011-03-24 ENCOUNTER — Telehealth (INDEPENDENT_AMBULATORY_CARE_PROVIDER_SITE_OTHER): Payer: Self-pay

## 2011-03-24 NOTE — Telephone Encounter (Signed)
Derrick from Dr Avbuere's office calling to let us know that the pt has been cleared for surgery. The pt's blood sugars are under control now with them being in the 120's. I asked if Dr Jeanie Cooks would send [name] a written note so we could send it to the hospital when we r/s the surgery. I will notify Dr Donne Hazel

## 2011-03-25 NOTE — Telephone Encounter (Signed)
Do I need to redo orders etc to get her scheduled again?  I will need to see her back one more time prior to surgery to redo her paperwork

## 2011-03-26 ENCOUNTER — Other Ambulatory Visit (INDEPENDENT_AMBULATORY_CARE_PROVIDER_SITE_OTHER): Payer: Self-pay | Admitting: General Surgery

## 2011-03-26 ENCOUNTER — Telehealth (INDEPENDENT_AMBULATORY_CARE_PROVIDER_SITE_OTHER): Payer: Self-pay

## 2011-03-26 NOTE — Telephone Encounter (Signed)
LMOM for pt notifying her that we did receive clearance from Dr Avbuere's office about getting her rescheduled for sx. The orders are complete and sent to our surgery schedulers for them to call the pt back. If the pt doesn't hear from our office by next week pt adv. To call our office to schedule.

## 2011-03-26 NOTE — Telephone Encounter (Signed)
The pt will need new orders on face sheet and in epic please.

## 2011-04-22 ENCOUNTER — Encounter (HOSPITAL_COMMUNITY): Payer: Self-pay | Admitting: Pharmacy Technician

## 2011-04-24 ENCOUNTER — Encounter (HOSPITAL_COMMUNITY): Payer: Self-pay | Admitting: *Deleted

## 2011-04-24 ENCOUNTER — Emergency Department (HOSPITAL_COMMUNITY)
Admission: EM | Admit: 2011-04-24 | Discharge: 2011-04-24 | Disposition: A | Discharge status: Home / Self Care | Payer: PRIVATE HEALTH INSURANCE | Attending: Emergency Medicine | Admitting: Emergency Medicine

## 2011-04-24 ENCOUNTER — Emergency Department (HOSPITAL_COMMUNITY): Payer: PRIVATE HEALTH INSURANCE

## 2011-04-24 DIAGNOSIS — Z87891 Personal history of nicotine dependence: Secondary | ICD-10-CM | POA: Insufficient documentation

## 2011-04-24 DIAGNOSIS — I10 Essential (primary) hypertension: Secondary | ICD-10-CM | POA: Insufficient documentation

## 2011-04-24 DIAGNOSIS — E1149 Type 2 diabetes mellitus with other diabetic neurological complication: Secondary | ICD-10-CM | POA: Insufficient documentation

## 2011-04-24 DIAGNOSIS — J4489 Other specified chronic obstructive pulmonary disease: Secondary | ICD-10-CM | POA: Insufficient documentation

## 2011-04-24 DIAGNOSIS — H548 Legal blindness, as defined in USA: Secondary | ICD-10-CM | POA: Insufficient documentation

## 2011-04-24 DIAGNOSIS — E1142 Type 2 diabetes mellitus with diabetic polyneuropathy: Secondary | ICD-10-CM | POA: Insufficient documentation

## 2011-04-24 DIAGNOSIS — I252 Old myocardial infarction: Secondary | ICD-10-CM | POA: Insufficient documentation

## 2011-04-24 DIAGNOSIS — J449 Chronic obstructive pulmonary disease, unspecified: Secondary | ICD-10-CM | POA: Insufficient documentation

## 2011-04-24 DIAGNOSIS — Z8673 Personal history of transient ischemic attack (TIA), and cerebral infarction without residual deficits: Secondary | ICD-10-CM | POA: Insufficient documentation

## 2011-04-24 DIAGNOSIS — J069 Acute upper respiratory infection, unspecified: Secondary | ICD-10-CM | POA: Insufficient documentation

## 2011-04-24 MED ORDER — ALBUTEROL SULFATE (5 MG/ML) 0.5% IN NEBU
5.0000 mg | INHALATION_SOLUTION | Freq: Once | RESPIRATORY_TRACT | Status: AC
  Administered 2011-04-24: 2.5 mg via RESPIRATORY_TRACT
  Filled 2011-04-24: qty 0.5

## 2011-04-24 MED ORDER — PREDNISONE 20 MG PO TABS: 60.0000 mg | ORAL_TABLET | Freq: Every day | ORAL | Status: AC

## 2011-04-24 MED ORDER — IPRATROPIUM BROMIDE 0.02 % IN SOLN
0.5000 mg | Freq: Once | RESPIRATORY_TRACT | Status: AC
  Administered 2011-04-24: 0.5 mg via RESPIRATORY_TRACT
  Filled 2011-04-24: qty 2.5

## 2011-04-24 NOTE — ED Provider Notes (Signed)
History     CSN: [id number]  Arrival date & time 04/24/11  1725   First MD Initiated Contact with Patient 04/24/11 1849      Chief Complaint  Patient presents with  . Cough    (Consider location/radiation/quality/duration/timing/severity/associated sxs/prior treatment) HPI Comments: Patient with a PMH significant for Chronic bronchitis comes in today with a chief complaint of worsening productive cough for the past week.  She reports that she has had a cough for years, but it has been worse the past week.  She is also complaining of a lot of nasal congestion for the past week.  She currently does not smoke and reports that she quit smoking 20 years ago.  She denies any chest pain.  She reports that she has been wheezing over the past couple of days.  She currently takes a Symbicort inhaler and ProAir as needed.  She reports that today she took her ProAir three times, but did not feel that it helped.   The history is provided by the patient.    Past Medical History  Diagnosis Date  . Umbilical hernia   . Hypertension   . Diabetes mellitus   . Asthma   . Seizures   . Stroke     last 2003  . Heart murmur     born with   . Dysrhythmia     skipped beats  . Myocardial infarction     hi of x 2  last one 2003   . COPD (chronic obstructive pulmonary disease)     chronic bronchitis   . Pneumonia     hx of 2009  . GERD (gastroesophageal reflux disease)   . Nephrolithiasis     frequent urination , urination at nite   . Glaucoma   . Legally blind     left eye   . Headache     migraines   . Neuropathy     due to diabetes   . Dizziness     secondary to diabetes and hypertension   . Arthritis     joint pain   . Hx of blood clots     hematomas removed from left side of brain from 35mo to 89yrold     Past Surgical History  Procedure Date  . Kidney stone removal   . Brain hematoma evacutation     five procedures total, first procedure when [age]onths old, last at [age]ears of  age.  . Vaginal hysterectomy   . Shunt removal     shunt inserted at age 44 50emoved at age 46   Family History  Problem Relation Age of Onset  . Cancer Mother     uterine  . Cancer Maternal Grandmother     brain    History  Substance Use Topics  . Smoking status: Former Smoker    Quit date: 01/20/1987  . Smokeless tobacco: Never Used  . Alcohol Use: No    OB History    Grav Para Term Preterm Abortions TAB SAB Ect Mult Living                  Review of Systems  Constitutional: Negative for fever and chills.  HENT: Positive for congestion, rhinorrhea and postnasal drip. Negative for sore throat, trouble swallowing and sinus pressure.   Respiratory: Positive for cough and wheezing. Negative for chest tightness.   Cardiovascular: Negative for chest pain and leg swelling.  Gastrointestinal: Negative for nausea and vomiting.  Neurological: Negative  for dizziness, syncope and light-headedness.    Allergies  Codeine  Home Medications   Current Outpatient Rx  Name Route Sig Dispense Refill  . ALBUTEROL SULFATE HFA 108 (90 BASE) MCG/ACT IN AERS Inhalation Inhale 2 puffs into the lungs every 6 (six) hours as needed. Wheezing     . BACLOFEN 10 MG PO TABS Oral Take 10 mg by mouth 3 (three) times daily.    . BUDESONIDE-FORMOTEROL FUMARATE 160-4.5 MCG/ACT IN AERO Inhalation Inhale 2 puffs into the lungs 2 (two) times daily.    [name] GABAPENTIN 300 MG PO CAPS Oral Take 300 mg by mouth 3 (three) times daily.    [name] GLIPIZIDE ER 10 MG PO TB24 Oral Take 20 mg by mouth daily.     . INSULIN ASPART 100 UNIT/ML [place] SOLN Subcutaneous Inject 2-14 Units into the skin 4 (four) times daily. Sliding scale    . INSULIN DETEMIR 100 UNIT/ML [place] SOLN Subcutaneous Inject 30-40 Units into the skin 2 (two) times daily. 30 am 40 pm    . RANITIDINE HCL 150 MG PO TABS Oral Take 150 mg by mouth 2 (two) times daily.    [name] SIMVASTATIN 20 MG PO TABS Oral Take 20 mg by mouth every evening.    . TOPIRAMATE 200 MG  PO TABS Oral Take 200 mg by mouth 2 (two) times daily.    . TRAZODONE HCL 50 MG PO TABS Oral Take 150 mg by mouth at bedtime.     [name] PREDNISONE 20 MG PO TABS Oral Take 3 tablets (60 mg total) by mouth daily. 15 tablet 0    BP 146/84  Pulse 103  Temp 99.3 F (37.4 C)  Resp 20  SpO2 99%  Physical Exam  Nursing note and vitals reviewed. Constitutional: She appears well-developed and well-nourished. No distress.  HENT:  Head: Normocephalic and atraumatic.  Nose: Mucosal edema and rhinorrhea present.  Mouth/Throat: Uvula is midline, oropharynx is clear and moist and mucous membranes are normal.  Neck: Normal range of motion. Neck supple.  Cardiovascular: Normal rate, regular rhythm and normal heart sounds.   Pulmonary/Chest: Effort normal. No respiratory distress. She has wheezes. She has no rales. She exhibits no tenderness.       Diffuse expiratory wheezing  Neurological: She is alert.  Skin: Skin is warm and dry. She is not diaphoretic.  Psychiatric: She has a normal mood and affect.    ED Course  Procedures (including critical care time)  Labs Reviewed - No data to display Dg Chest 2 View  04/24/2011  *RADIOLOGY REPORT*  Clinical Data: Productive cough, body aches, shortness of breath  CHEST - 2 VIEW  Comparison: Chest x-ray of 03/03/2011  Findings: No active infiltrate or effusion is seen.  There is some peribronchial thickening which may indicate bronchitis.  The heart is within upper limits of normal.  No acute bony abnormality is seen.  IMPRESSION: No pneumonia.  Question bronchitis.  Original Report Authenticated By: Joretta Bachelor, M.D.     1. Chronic bronchitis   2. URI (upper respiratory infection)     Wheezing resolved after Albuterol/Atrovent administration.     MDM  Patient with history of Chronic Bronchitis comes in today with a chief complaint of worsening productive cough and nasal congestion.  CXR negative for pneumonia.  Patient with wheezing on initial exam  resolved after breathing treatment.  Pulse ox 99 on RA.  Patient discharged home and given Rx for short course of steroids.  Instructed pt to follow  up with PCP.  Return precautions discussed.        Sherlyn Lees [place], [name] 04/25/11 [phone]

## 2011-04-24 NOTE — Discharge Instructions (Signed)
Read the instructions below on reasons to return to the emergency department and to learn more about your diagnosis. If prescribed a cough suppressant during your visit, do not operate heavy machinery with in 5 hours of taking this medication. Followup with your primary care doctor in 4 days if your symptoms persist.  Your more than welcome to return to the emergency department if symptoms worsen or become concerning.  Return to the Emergency Department if you develop increasing shortness of breath, chest pain, or symptoms become more concerning.  Upper Respiratory Infection, Adult  An upper respiratory infection (URI) is also sometimes known as the common cold. Most people improve within 1 week, but symptoms can last up to 2 weeks. A residual cough may last even longer.   URI is most commonly caused by a virus. Viruses are NOT treated with antibiotics. You can easily spread the virus to others by oral contact. This includes kissing, sharing a glass, coughing, or sneezing. Touching your mouth or nose and then touching a surface, which is then touched by another person, can also spread the virus.   TREATMENT  Treatment is directed at relieving symptoms. There is no cure. Antibiotics are not effective, because the infection is caused by a virus, not by bacteria. Treatment may include:  Increased fluid intake. Sports drinks offer valuable electrolytes, sugars, and fluids.  Breathing heated mist or steam (vaporizer or shower).  Eating chicken soup or other clear broths, and maintaining good nutrition.  Getting plenty of rest.  Using gargles or lozenges for comfort.  Controlling fevers with ibuprofen or acetaminophen as directed by your caregiver.  Increasing usage of your inhaler if you have asthma.  Return to work when your temperature has returned to normal.   SEEK MEDICAL CARE IF:  After the first few days, you feel you are getting worse rather than better.  You develop worsening shortness of  breath, or brown or red sputum. These may be signs of pneumonia.  You develop yellow or brown nasal discharge or pain in the face, especially when you bend forward. These may be signs of sinusitis.  You develop a fever, swollen neck glands, pain with swallowing, or white areas in the back of your throat. These may be signs of strep throat.

## 2011-04-24 NOTE — ED Notes (Signed)
Pt states sudden onset yesteday with body aches, cough, nasal congestion, and generally feeling bad. Mucous membranes are moist and pink. States has a frequent harsh nonproductive cough.

## 2011-04-24 NOTE — ED Notes (Signed)
To ed for eval of cough and congestion for the past week.

## 2011-04-24 NOTE — ED Notes (Signed)
resp tech notified for neb treatment

## 2011-04-27 ENCOUNTER — Encounter (INDEPENDENT_AMBULATORY_CARE_PROVIDER_SITE_OTHER): Payer: PRIVATE HEALTH INSURANCE | Admitting: General Surgery

## 2011-04-27 ENCOUNTER — Other Ambulatory Visit (HOSPITAL_COMMUNITY): Payer: Self-pay | Admitting: *Deleted

## 2011-04-27 NOTE — ED Provider Notes (Signed)
History/physical exam/procedure(s) were performed by non-physician practitioner and as supervising physician I was immediately available for consultation/collaboration. I have reviewed all notes and am in agreement with care and plan.   Shaune Pollack, MD 04/27/11 [phone]

## 2011-04-28 ENCOUNTER — Encounter (HOSPITAL_COMMUNITY)
Admission: RE | Admit: 2011-04-28 | Discharge: 2011-04-28 | Disposition: A | Discharge status: Home / Self Care | Payer: PRIVATE HEALTH INSURANCE | Source: Ambulatory Visit | Attending: General Surgery | Admitting: General Surgery

## 2011-04-28 ENCOUNTER — Encounter (HOSPITAL_COMMUNITY): Payer: Self-pay

## 2011-04-28 HISTORY — DX: Type 2 diabetes mellitus with diabetic neuropathy, unspecified: E11.40

## 2011-04-28 HISTORY — DX: Sleep apnea, unspecified: G47.30

## 2011-04-28 HISTORY — DX: Absence epileptic syndrome, not intractable, without status epilepticus: G40.A09

## 2011-04-28 LAB — SURGICAL PCR SCREEN
MRSA, PCR: NEGATIVE
Staphylococcus aureus: NEGATIVE

## 2011-04-28 LAB — CBC
HCT: 39.3 % (ref 36.0–46.0)
Hemoglobin: 12.8 g/dL (ref 12.0–15.0)
MCH: 29 pg (ref 26.0–34.0)
MCHC: 32.6 g/dL (ref 30.0–36.0)
MCV: 88.9 fL (ref 78.0–100.0)
Platelets: 267 10*3/uL (ref 150–400)
RBC: 4.42 MIL/uL (ref 3.87–5.11)
RDW: 13.8 % (ref 11.5–15.5)
WBC: 7.7 10*3/uL (ref 4.0–10.5)

## 2011-04-28 LAB — BASIC METABOLIC PANEL
BUN: 8 mg/dL (ref 6–23)
CO2: 22 mEq/L (ref 19–32)
Calcium: 9.7 mg/dL (ref 8.4–10.5)
Chloride: 109 mEq/L (ref 96–112)
Creatinine, Ser: 0.82 mg/dL (ref 0.50–1.10)
GFR calc Af Amer: >90 mL/min (ref >90)
GFR calc non Af Amer: 85 mL/min — ABNORMAL LOW (ref >90)
Glucose, Bld: 134 mg/dL — ABNORMAL HIGH (ref 70–99)
Potassium: 3.5 mEq/L (ref 3.5–5.1)
Sodium: 141 mEq/L (ref 135–145)

## 2011-04-28 NOTE — Pre-Procedure Instructions (Signed)
[place]  04/28/2011   Your procedure is scheduled BP:[id number], April 16  Report to [facility] at [place].  Call this number if you have problems the morning of surgery: [phone]   Remember:   Do not eat food:after midnight Monday night  May have clear liquids: up to 4 Hours before arrival. ( 1:30 am)  Clear liquids include soda, tea, black coffee, apple or grape juice, broth.  Take these medicines the morning of surgery with A SIP OF WATER: *inhalers,Baclofen, Gabapentin,Prednisone,Zantac,Topamax,*( Take 20 units of Levimir at bedtime,Monday night, with bedtime snack. Do not take any insulin or Glipizide the morning of surgery.*   Do not wear jewelry, make-up or nail polish.  Do not wear lotions, powders, or perfumes. You may wear deodorant.  Do not shave 48 hours prior to surgery.  Do not bring valuables to the hospital.  Contacts, dentures or bridgework may not be worn into surgery.  Leave suitcase in the car. After surgery it may be brought to your room.  For patients admitted to the hospital, checkout time is 11:00 AM the day of discharge.   Patients discharged the day of surgery will not be allowed to drive home.  Name and phone number of your driver: *fiance, Briscoe Burns**  Special Instructions: CHG Shower Use Special Wash: 1/2 bottle night before surgery and 1/2 bottle morning of surgery.   Please read over the following fact sheets that you were given: Pain Booklet, Coughing and Deep Breathing, MRSA Information and Surgical Site Infection Prevention

## 2011-05-04 MED ORDER — HEPARIN SODIUM (PORCINE) 5000 UNIT/ML IJ SOLN
5000.0000 [IU] | Freq: Once | INTRAMUSCULAR | Status: AC
  Administered 2011-05-05: 5000 [IU] via SUBCUTANEOUS
  Filled 2011-05-04: qty 1

## 2011-05-04 MED ORDER — CEFAZOLIN SODIUM-DEXTROSE 2-3 GM-% IV SOLR
2.0000 g | INTRAVENOUS | Status: DC
  Filled 2011-05-04: qty 50

## 2011-05-05 ENCOUNTER — Encounter (HOSPITAL_COMMUNITY): Payer: Self-pay | Admitting: *Deleted

## 2011-05-05 ENCOUNTER — Encounter (HOSPITAL_COMMUNITY): Payer: Self-pay | Admitting: Certified Registered"

## 2011-05-05 ENCOUNTER — Encounter (HOSPITAL_COMMUNITY)
Admission: RE | Disposition: A | Discharge status: Home / Self Care | Payer: Self-pay | Source: Ambulatory Visit | Attending: General Surgery

## 2011-05-05 ENCOUNTER — Ambulatory Visit (HOSPITAL_COMMUNITY): Payer: PRIVATE HEALTH INSURANCE | Admitting: Certified Registered"

## 2011-05-05 ENCOUNTER — Inpatient Hospital Stay (HOSPITAL_COMMUNITY)
Admission: RE | Admit: 2011-05-05 | Discharge: 2011-05-18 | DRG: 354 | Disposition: A | Discharge status: Home / Self Care | Payer: PRIVATE HEALTH INSURANCE | Source: Ambulatory Visit | Attending: General Surgery | Admitting: General Surgery

## 2011-05-05 DIAGNOSIS — E66813 Obesity, class 3: Secondary | ICD-10-CM | POA: Diagnosis present

## 2011-05-05 DIAGNOSIS — R569 Unspecified convulsions: Secondary | ICD-10-CM | POA: Insufficient documentation

## 2011-05-05 DIAGNOSIS — I10 Essential (primary) hypertension: Secondary | ICD-10-CM | POA: Diagnosis present

## 2011-05-05 DIAGNOSIS — Z8049 Family history of malignant neoplasm of other genital organs: Secondary | ICD-10-CM

## 2011-05-05 DIAGNOSIS — Y9210 Unspecified residential institution as the place of occurrence of the external cause: Secondary | ICD-10-CM | POA: Diagnosis not present

## 2011-05-05 DIAGNOSIS — E1149 Type 2 diabetes mellitus with other diabetic neurological complication: Secondary | ICD-10-CM | POA: Diagnosis present

## 2011-05-05 DIAGNOSIS — Z8673 Personal history of transient ischemic attack (TIA), and cerebral infarction without residual deficits: Secondary | ICD-10-CM

## 2011-05-05 DIAGNOSIS — J449 Chronic obstructive pulmonary disease, unspecified: Secondary | ICD-10-CM | POA: Diagnosis present

## 2011-05-05 DIAGNOSIS — G40909 Epilepsy, unspecified, not intractable, without status epilepticus: Secondary | ICD-10-CM | POA: Diagnosis present

## 2011-05-05 DIAGNOSIS — K432 Incisional hernia without obstruction or gangrene: Secondary | ICD-10-CM

## 2011-05-05 DIAGNOSIS — Z87891 Personal history of nicotine dependence: Secondary | ICD-10-CM

## 2011-05-05 DIAGNOSIS — E782 Mixed hyperlipidemia: Secondary | ICD-10-CM | POA: Insufficient documentation

## 2011-05-05 DIAGNOSIS — E785 Hyperlipidemia, unspecified: Secondary | ICD-10-CM | POA: Insufficient documentation

## 2011-05-05 DIAGNOSIS — G4730 Sleep apnea, unspecified: Secondary | ICD-10-CM | POA: Diagnosis present

## 2011-05-05 DIAGNOSIS — H548 Legal blindness, as defined in USA: Secondary | ICD-10-CM | POA: Diagnosis present

## 2011-05-05 DIAGNOSIS — K929 Disease of digestive system, unspecified: Secondary | ICD-10-CM | POA: Diagnosis not present

## 2011-05-05 DIAGNOSIS — R229 Localized swelling, mass and lump, unspecified: Secondary | ICD-10-CM | POA: Diagnosis present

## 2011-05-05 DIAGNOSIS — Z6841 Body Mass Index (BMI) 40.0 and over, adult: Secondary | ICD-10-CM

## 2011-05-05 DIAGNOSIS — K560 Paralytic ileus: Secondary | ICD-10-CM | POA: Diagnosis not present

## 2011-05-05 DIAGNOSIS — G40A09 Absence epileptic syndrome, not intractable, without status epilepticus: Secondary | ICD-10-CM | POA: Insufficient documentation

## 2011-05-05 DIAGNOSIS — Z87442 Personal history of urinary calculi: Secondary | ICD-10-CM

## 2011-05-05 DIAGNOSIS — K219 Gastro-esophageal reflux disease without esophagitis: Secondary | ICD-10-CM | POA: Diagnosis present

## 2011-05-05 DIAGNOSIS — J4489 Other specified chronic obstructive pulmonary disease: Secondary | ICD-10-CM | POA: Diagnosis present

## 2011-05-05 DIAGNOSIS — Z01812 Encounter for preprocedural laboratory examination: Secondary | ICD-10-CM

## 2011-05-05 DIAGNOSIS — H409 Unspecified glaucoma: Secondary | ICD-10-CM | POA: Diagnosis present

## 2011-05-05 DIAGNOSIS — R000 Tachycardia, unspecified: Secondary | ICD-10-CM | POA: Diagnosis not present

## 2011-05-05 DIAGNOSIS — E119 Type 2 diabetes mellitus without complications: Secondary | ICD-10-CM | POA: Diagnosis present

## 2011-05-05 DIAGNOSIS — F329 Major depressive disorder, single episode, unspecified: Secondary | ICD-10-CM | POA: Diagnosis present

## 2011-05-05 DIAGNOSIS — Y834 Other reconstructive surgery as the cause of abnormal reaction of the patient, or of later complication, without mention of misadventure at the time of the procedure: Secondary | ICD-10-CM | POA: Diagnosis not present

## 2011-05-05 DIAGNOSIS — Z808 Family history of malignant neoplasm of other organs or systems: Secondary | ICD-10-CM

## 2011-05-05 DIAGNOSIS — F32A Depression, unspecified: Secondary | ICD-10-CM | POA: Diagnosis present

## 2011-05-05 DIAGNOSIS — E669 Obesity, unspecified: Secondary | ICD-10-CM | POA: Diagnosis present

## 2011-05-05 DIAGNOSIS — F330 Major depressive disorder, recurrent, mild: Secondary | ICD-10-CM | POA: Insufficient documentation

## 2011-05-05 DIAGNOSIS — D2120 Benign neoplasm of connective and other soft tissue of unspecified lower limb, including hip: Secondary | ICD-10-CM

## 2011-05-05 DIAGNOSIS — E1142 Type 2 diabetes mellitus with diabetic polyneuropathy: Secondary | ICD-10-CM | POA: Diagnosis present

## 2011-05-05 HISTORY — PX: MASS EXCISION: SHX2000

## 2011-05-05 HISTORY — DX: Cerebral infarction due to unspecified occlusion or stenosis of unspecified cerebral artery: I63.50

## 2011-05-05 HISTORY — DX: Migraine, unspecified, not intractable, without status migrainosus: G43.909

## 2011-05-05 HISTORY — PX: INCISIONAL HERNIA REPAIR: SHX193

## 2011-05-05 LAB — GLUCOSE, CAPILLARY
Glucose-Capillary: 163 mg/dL — ABNORMAL HIGH (ref 70–99)
Glucose-Capillary: 165 mg/dL — ABNORMAL HIGH (ref 70–99)
Glucose-Capillary: 184 mg/dL — ABNORMAL HIGH (ref 70–99)
Glucose-Capillary: 128 mg/dL — ABNORMAL HIGH (ref 70–99)
Glucose-Capillary: 161 mg/dL — ABNORMAL HIGH (ref 70–99)

## 2011-05-05 SURGERY — REPAIR, HERNIA, INCISIONAL, LAPAROSCOPIC
Anesthesia: General | Site: Knee | Laterality: Right | Wound class: Clean

## 2011-05-05 MED ORDER — DIPHENHYDRAMINE HCL 50 MG/ML IJ SOLN: 12.5000 mg | Freq: Four times a day (QID) | INTRAMUSCULAR | Status: DC | PRN

## 2011-05-05 MED ORDER — NALOXONE HCL 0.4 MG/ML IJ SOLN: 0.4000 mg | INTRAMUSCULAR | Status: DC | PRN

## 2011-05-05 MED ORDER — TRAZODONE HCL 150 MG PO TABS
150.0000 mg | ORAL_TABLET | Freq: Every day | ORAL | Status: DC
  Administered 2011-05-05 – 2011-05-07 (×3): 150 mg via ORAL
  Filled 2011-05-05 (×4): qty 1

## 2011-05-05 MED ORDER — BIOTENE DRY MOUTH MT LIQD
15.0000 mL | Freq: Two times a day (BID) | OROMUCOSAL | Status: DC
  Administered 2011-05-06 – 2011-05-08 (×5): 15 mL via OROMUCOSAL

## 2011-05-05 MED ORDER — ACETAMINOPHEN 650 MG RE SUPP: 650.0000 mg | Freq: Four times a day (QID) | RECTAL | Status: DC | PRN

## 2011-05-05 MED ORDER — ACETAMINOPHEN 325 MG PO TABS: 650.0000 mg | ORAL_TABLET | Freq: Four times a day (QID) | ORAL | Status: DC | PRN

## 2011-05-05 MED ORDER — PROPOFOL 10 MG/ML IV EMUL
INTRAVENOUS | Status: DC | PRN
  Administered 2011-05-05: 100 mg via INTRAVENOUS

## 2011-05-05 MED ORDER — CHLORHEXIDINE GLUCONATE 0.12 % MT SOLN
OROMUCOSAL | Status: AC
  Filled 2011-05-05: qty 15

## 2011-05-05 MED ORDER — LIDOCAINE HCL (CARDIAC) 20 MG/ML IV SOLN
INTRAVENOUS | Status: DC | PRN
  Administered 2011-05-05: 40 mg via INTRAVENOUS

## 2011-05-05 MED ORDER — HYDROMORPHONE HCL PF 1 MG/ML IJ SOLN
0.2500 mg | INTRAMUSCULAR | Status: DC | PRN
  Administered 2011-05-05 (×2): 0.5 mg via INTRAVENOUS

## 2011-05-05 MED ORDER — INSULIN ASPART 100 UNIT/ML ~~LOC~~ SOLN
0.0000 [IU] | Freq: Three times a day (TID) | SUBCUTANEOUS | Status: DC
  Administered 2011-05-05 – 2011-05-06 (×2): 3 [IU] via SUBCUTANEOUS
  Administered 2011-05-06: 2 [IU] via SUBCUTANEOUS
  Administered 2011-05-06 – 2011-05-08 (×7): 3 [IU] via SUBCUTANEOUS
  Administered 2011-05-09 (×2): 2 [IU] via SUBCUTANEOUS
  Administered 2011-05-09: 3 [IU] via SUBCUTANEOUS
  Administered 2011-05-10: 2 [IU] via SUBCUTANEOUS
  Administered 2011-05-11: 3 [IU] via SUBCUTANEOUS
  Administered 2011-05-11 – 2011-05-12 (×3): 2 [IU] via SUBCUTANEOUS
  Administered 2011-05-12: 3 [IU] via SUBCUTANEOUS
  Administered 2011-05-12 – 2011-05-13 (×4): 2 [IU] via SUBCUTANEOUS
  Administered 2011-05-14 (×2): 3 [IU] via SUBCUTANEOUS
  Administered 2011-05-15 (×2): 2 [IU] via SUBCUTANEOUS
  Administered 2011-05-15 – 2011-05-16 (×3): 3 [IU] via SUBCUTANEOUS
  Administered 2011-05-16: 2 [IU] via SUBCUTANEOUS
  Administered 2011-05-17: 3 [IU] via SUBCUTANEOUS
  Administered 2011-05-17: 2 [IU] via SUBCUTANEOUS
  Administered 2011-05-17: 3 [IU] via SUBCUTANEOUS
  Administered 2011-05-18: 09:00:00 via SUBCUTANEOUS

## 2011-05-05 MED ORDER — LOSARTAN POTASSIUM 50 MG PO TABS
50.0000 mg | ORAL_TABLET | Freq: Every day | ORAL | Status: DC
  Administered 2011-05-06 – 2011-05-17 (×10): 50 mg via ORAL
  Filled 2011-05-05 (×14): qty 1

## 2011-05-05 MED ORDER — LACTATED RINGERS IV SOLN
INTRAVENOUS | Status: DC | PRN
  Administered 2011-05-05 (×2): via INTRAVENOUS

## 2011-05-05 MED ORDER — ONDANSETRON HCL 4 MG/2ML IJ SOLN
4.0000 mg | Freq: Four times a day (QID) | INTRAMUSCULAR | Status: DC | PRN
  Administered 2011-05-08: 4 mg via INTRAVENOUS
  Filled 2011-05-05: qty 2

## 2011-05-05 MED ORDER — ONDANSETRON HCL 4 MG/2ML IJ SOLN
INTRAMUSCULAR | Status: DC | PRN
  Administered 2011-05-05: 4 mg via INTRAVENOUS

## 2011-05-05 MED ORDER — MORPHINE SULFATE (PF) 1 MG/ML IV SOLN
INTRAVENOUS | Status: DC
  Administered 2011-05-05: 3 mg via INTRAVENOUS
  Administered 2011-05-05: 6.99 mg via INTRAVENOUS
  Administered 2011-05-05: 9 mg via INTRAVENOUS
  Administered 2011-05-05: 10:00:00 via INTRAVENOUS
  Administered 2011-05-06: 6 mg via INTRAVENOUS
  Administered 2011-05-06: 21:00:00 via INTRAVENOUS
  Administered 2011-05-06: 8 mg via INTRAVENOUS
  Administered 2011-05-06: 02:00:00 via INTRAVENOUS
  Administered 2011-05-06: 17.17 mg via INTRAVENOUS
  Administered 2011-05-06: 23.17 mg via INTRAVENOUS
  Administered 2011-05-06: 13.8 mg via INTRAVENOUS
  Administered 2011-05-07: 10 mg via INTRAVENOUS
  Administered 2011-05-07: 11 mg via INTRAVENOUS
  Administered 2011-05-07 (×2): 5 mg via INTRAVENOUS
  Administered 2011-05-07: 6 mg via INTRAVENOUS
  Administered 2011-05-07: 21 mg via INTRAVENOUS
  Administered 2011-05-08: 4 mg via INTRAVENOUS
  Administered 2011-05-08: 6 mg via INTRAVENOUS
  Administered 2011-05-08: 3.3 mg via INTRAVENOUS
  Administered 2011-05-08: 6 mg via INTRAVENOUS
  Administered 2011-05-08: 5 mg via INTRAVENOUS
  Administered 2011-05-08 – 2011-05-09 (×2): 2 mg via INTRAVENOUS
  Administered 2011-05-09: 3 mg via INTRAVENOUS
  Administered 2011-05-09: 9 mg via INTRAVENOUS

## 2011-05-05 MED ORDER — GABAPENTIN 300 MG PO CAPS
300.0000 mg | ORAL_CAPSULE | Freq: Three times a day (TID) | ORAL | Status: DC
  Administered 2011-05-05 – 2011-05-08 (×10): 300 mg via ORAL
  Filled 2011-05-05 (×12): qty 1

## 2011-05-05 MED ORDER — DIPHENHYDRAMINE HCL 12.5 MG/5ML PO ELIX
12.5000 mg | ORAL_SOLUTION | Freq: Four times a day (QID) | ORAL | Status: DC | PRN
  Filled 2011-05-05: qty 5

## 2011-05-05 MED ORDER — LIDOCAINE HCL 4 % MT SOLN
OROMUCOSAL | Status: DC | PRN
  Administered 2011-05-05: 4 mL via TOPICAL

## 2011-05-05 MED ORDER — ALBUTEROL SULFATE HFA 108 (90 BASE) MCG/ACT IN AERS
2.0000 | INHALATION_SPRAY | Freq: Four times a day (QID) | RESPIRATORY_TRACT | Status: DC | PRN
  Administered 2011-05-08 – 2011-05-09 (×4): 2 via RESPIRATORY_TRACT
  Filled 2011-05-05: qty 6.7

## 2011-05-05 MED ORDER — NEOSTIGMINE METHYLSULFATE 1 MG/ML IJ SOLN
INTRAMUSCULAR | Status: DC | PRN
  Administered 2011-05-05: 5 mg via INTRAVENOUS

## 2011-05-05 MED ORDER — CHLORHEXIDINE GLUCONATE 0.12 % MT SOLN
15.0000 mL | Freq: Two times a day (BID) | OROMUCOSAL | Status: DC
  Administered 2011-05-05 – 2011-05-08 (×6): 15 mL via OROMUCOSAL
  Filled 2011-05-05 (×6): qty 15

## 2011-05-05 MED ORDER — LABETALOL HCL 5 MG/ML IV SOLN
INTRAVENOUS | Status: DC | PRN
  Administered 2011-05-05 (×2): 2.5 mg via INTRAVENOUS

## 2011-05-05 MED ORDER — PROMETHAZINE HCL 25 MG/ML IJ SOLN: 6.2500 mg | INTRAMUSCULAR | Status: DC | PRN

## 2011-05-05 MED ORDER — CEFAZOLIN SODIUM 1-5 GM-% IV SOLN
1.0000 g | Freq: Three times a day (TID) | INTRAVENOUS | Status: DC
  Administered 2011-05-05 – 2011-05-06 (×3): 1 g via INTRAVENOUS
  Filled 2011-05-05 (×5): qty 50

## 2011-05-05 MED ORDER — MORPHINE SULFATE (PF) 1 MG/ML IV SOLN
INTRAVENOUS | Status: AC
  Administered 2011-05-05: 21:00:00
  Filled 2011-05-05: qty 25

## 2011-05-05 MED ORDER — FENTANYL CITRATE 0.05 MG/ML IJ SOLN
INTRAMUSCULAR | Status: DC | PRN
  Administered 2011-05-05: 50 ug via INTRAVENOUS
  Administered 2011-05-05: 100 ug via INTRAVENOUS
  Administered 2011-05-05: 25 ug via INTRAVENOUS
  Administered 2011-05-05: 125 ug via INTRAVENOUS
  Administered 2011-05-05: 50 ug via INTRAVENOUS

## 2011-05-05 MED ORDER — SODIUM CHLORIDE 0.9 % IJ SOLN: 9.0000 mL | INTRAMUSCULAR | Status: DC | PRN

## 2011-05-05 MED ORDER — TOPIRAMATE 100 MG PO TABS
200.0000 mg | ORAL_TABLET | Freq: Two times a day (BID) | ORAL | Status: DC
  Administered 2011-05-05 – 2011-05-11 (×12): 200 mg via ORAL
  Filled 2011-05-05 (×19): qty 2

## 2011-05-05 MED ORDER — BACLOFEN 10 MG PO TABS
10.0000 mg | ORAL_TABLET | Freq: Three times a day (TID) | ORAL | Status: DC
  Administered 2011-05-05 – 2011-05-08 (×10): 10 mg via ORAL
  Filled 2011-05-05 (×12): qty 1

## 2011-05-05 MED ORDER — ONDANSETRON HCL 4 MG/2ML IJ SOLN: 4.0000 mg | Freq: Four times a day (QID) | INTRAMUSCULAR | Status: DC | PRN

## 2011-05-05 MED ORDER — CEFAZOLIN SODIUM 1-5 GM-% IV SOLN
INTRAVENOUS | Status: DC | PRN
  Administered 2011-05-05: 2 g via INTRAVENOUS

## 2011-05-05 MED ORDER — BUDESONIDE-FORMOTEROL FUMARATE 160-4.5 MCG/ACT IN AERO
2.0000 | INHALATION_SPRAY | Freq: Two times a day (BID) | RESPIRATORY_TRACT | Status: DC
  Administered 2011-05-05 – 2011-05-18 (×22): 2 via RESPIRATORY_TRACT
  Filled 2011-05-05: qty 6

## 2011-05-05 MED ORDER — SODIUM CHLORIDE 0.9 % IV SOLN
INTRAVENOUS | Status: DC
  Administered 2011-05-05 – 2011-05-06 (×3): via INTRAVENOUS
  Administered 2011-05-06: 100 mL/h via INTRAVENOUS
  Administered 2011-05-07 – 2011-05-08 (×2): via INTRAVENOUS
  Administered 2011-05-09: 20 mL/h via INTRAVENOUS

## 2011-05-05 MED ORDER — BUPIVACAINE-EPINEPHRINE 0.25% -1:200000 IJ SOLN
INTRAMUSCULAR | Status: DC | PRN
  Administered 2011-05-05: 8 mL

## 2011-05-05 MED ORDER — SUCCINYLCHOLINE CHLORIDE 20 MG/ML IJ SOLN
INTRAMUSCULAR | Status: DC | PRN
  Administered 2011-05-05: 100 mg via INTRAVENOUS

## 2011-05-05 MED ORDER — CEFAZOLIN SODIUM-DEXTROSE 2-3 GM-% IV SOLR: 2.0000 g | INTRAVENOUS | Status: DC

## 2011-05-05 MED ORDER — MIDAZOLAM HCL 5 MG/5ML IJ SOLN
INTRAMUSCULAR | Status: DC | PRN
  Administered 2011-05-05: 1 mg via INTRAVENOUS

## 2011-05-05 MED ORDER — 0.9 % SODIUM CHLORIDE (POUR BTL) OPTIME
TOPICAL | Status: DC | PRN
  Administered 2011-05-05: 1000 mL

## 2011-05-05 MED ORDER — PANTOPRAZOLE SODIUM 40 MG IV SOLR
40.0000 mg | Freq: Every day | INTRAVENOUS | Status: DC
  Administered 2011-05-05 – 2011-05-06 (×2): 40 mg via INTRAVENOUS
  Filled 2011-05-05 (×3): qty 40

## 2011-05-05 MED ORDER — GLYCOPYRROLATE 0.2 MG/ML IJ SOLN
INTRAMUSCULAR | Status: DC | PRN
  Administered 2011-05-05: .8 mg via INTRAVENOUS

## 2011-05-05 SURGICAL SUPPLY — 70 items
APPLIER CLIP LOGIC TI 5 (MISCELLANEOUS) IMPLANT
APPLIER CLIP ROT 10 11.4 M/L (STAPLE)
BENZOIN TINCTURE PRP APPL 2/3 (GAUZE/BANDAGES/DRESSINGS) IMPLANT
BLADE SURG 10 STRL SS (BLADE) IMPLANT
BLADE SURG 15 STRL LF DISP TIS (BLADE) IMPLANT
BLADE SURG 15 STRL SS (BLADE)
BLADE SURG ROTATE 9660 (MISCELLANEOUS) IMPLANT
CANISTER SUCTION 2500CC (MISCELLANEOUS) IMPLANT
CHLORAPREP W/TINT 10.5 ML (MISCELLANEOUS) ×3 IMPLANT
CHLORAPREP W/TINT 26ML (MISCELLANEOUS) ×3 IMPLANT
CLIP APPLIE ROT 10 11.4 M/L (STAPLE) IMPLANT
CLOTH BEACON ORANGE TIMEOUT ST (SAFETY) ×3 IMPLANT
COVER SURGICAL LIGHT HANDLE (MISCELLANEOUS) ×3 IMPLANT
DECANTER SPIKE VIAL GLASS SM (MISCELLANEOUS) IMPLANT
DERMABOND ADHESIVE PROPEN (GAUZE/BANDAGES/DRESSINGS) ×1
DERMABOND ADVANCED (GAUZE/BANDAGES/DRESSINGS) ×1
DERMABOND ADVANCED .7 DNX12 (GAUZE/BANDAGES/DRESSINGS) ×2 IMPLANT
DERMABOND ADVANCED .7 DNX6 (GAUZE/BANDAGES/DRESSINGS) ×2 IMPLANT
DEVICE SECURE STRAP 25 ABSORB (INSTRUMENTS) ×6 IMPLANT
DEVICE TROCAR PUNCTURE CLOSURE (ENDOMECHANICALS) ×3 IMPLANT
DRAPE INCISE IOBAN 66X45 STRL (DRAPES) ×3 IMPLANT
DRAPE PED LAPAROTOMY (DRAPES) ×3 IMPLANT
DRSG TEGADERM 4X4.75 (GAUZE/BANDAGES/DRESSINGS) ×3 IMPLANT
ELECT CAUTERY BLADE 6.4 (BLADE) ×3 IMPLANT
ELECT REM PT RETURN 9FT ADLT (ELECTROSURGICAL) ×3
ELECTRODE REM PT RTRN 9FT ADLT (ELECTROSURGICAL) ×2 IMPLANT
GAUZE SPONGE 2X2 8PLY STRL LF (GAUZE/BANDAGES/DRESSINGS) ×2 IMPLANT
GLOVE BIO SURGEON STRL SZ7 (GLOVE) ×3 IMPLANT
GLOVE BIOGEL PI IND STRL 7.0 (GLOVE) ×2 IMPLANT
GLOVE BIOGEL PI IND STRL 7.5 (GLOVE) ×2 IMPLANT
GLOVE BIOGEL PI INDICATOR 7.0 (GLOVE) ×1
GLOVE BIOGEL PI INDICATOR 7.5 (GLOVE) ×1
GLOVE ECLIPSE 6.5 STRL STRAW (GLOVE) ×3 IMPLANT
GLOVE ECLIPSE 7.0 STRL STRAW (GLOVE) ×6 IMPLANT
GLOVE SURG SS PI 7.0 STRL IVOR (GLOVE) ×3 IMPLANT
GOWN STRL NON-REIN LRG LVL3 (GOWN DISPOSABLE) ×9 IMPLANT
KIT BASIN OR (CUSTOM PROCEDURE TRAY) ×3 IMPLANT
KIT ROOM TURNOVER OR (KITS) ×3 IMPLANT
NEEDLE HYPO 25GX1X1/2 BEV (NEEDLE) ×3 IMPLANT
NEEDLE SPNL 22GX3.5 QUINCKE BK (NEEDLE) ×3 IMPLANT
NS IRRIG 1000ML POUR BTL (IV SOLUTION) ×3 IMPLANT
PACK SURGICAL SETUP 50X90 (CUSTOM PROCEDURE TRAY) IMPLANT
PAD ARMBOARD 7.5X6 YLW CONV (MISCELLANEOUS) ×6 IMPLANT
PEN SKIN MARKING BROAD (MISCELLANEOUS) ×3 IMPLANT
PENCIL BUTTON HOLSTER BLD 10FT (ELECTRODE) ×3 IMPLANT
SCALPEL HARMONIC ACE (MISCELLANEOUS) IMPLANT
SCISSORS LAP 5X35 DISP (ENDOMECHANICALS) ×3 IMPLANT
SET IRRIG TUBING LAPAROSCOPIC (IRRIGATION / IRRIGATOR) IMPLANT
SLEEVE ENDOPATH XCEL 5M (ENDOMECHANICALS) ×6 IMPLANT
SPECIMEN JAR SMALL (MISCELLANEOUS) IMPLANT
SPONGE GAUZE 2X2 STER 10/PKG (GAUZE/BANDAGES/DRESSINGS) ×1
SPONGE LAP 18X18 X RAY DECT (DISPOSABLE) IMPLANT
STAPLER VISISTAT 35W (STAPLE) ×3 IMPLANT
SUT MNCRL AB 4-0 PS2 18 (SUTURE) ×3 IMPLANT
SUT NOVA NAB DX-16 0-1 5-0 T12 (SUTURE) ×6 IMPLANT
SUT VIC AB 2-0 SH 27 (SUTURE)
SUT VIC AB 2-0 SH 27X BRD (SUTURE) IMPLANT
SUT VIC AB 3-0 SH 27 (SUTURE)
SUT VIC AB 3-0 SH 27XBRD (SUTURE) IMPLANT
SYR CONTROL 10ML LL (SYRINGE) IMPLANT
TOWEL OR 17X24 6PK STRL BLUE (TOWEL DISPOSABLE) ×3 IMPLANT
TOWEL OR 17X26 10 PK STRL BLUE (TOWEL DISPOSABLE) ×3 IMPLANT
TRAY FOLEY CATH 14FRSI W/METER (CATHETERS) ×3 IMPLANT
TRAY LAPAROSCOPIC (CUSTOM PROCEDURE TRAY) ×3 IMPLANT
TROCAR XCEL BLUNT TIP 100MML (ENDOMECHANICALS) ×3 IMPLANT
TROCAR XCEL NON-BLD 11X100MML (ENDOMECHANICALS) ×3 IMPLANT
TROCAR XCEL NON-BLD 5MMX100MML (ENDOMECHANICALS) ×3 IMPLANT
TUBING FILTER THERMOFLATOR (ELECTROSURGICAL) IMPLANT
Ventralight ST (Mesh General) ×3 IMPLANT
WATER STERILE IRR 1000ML POUR (IV SOLUTION) IMPLANT

## 2011-05-05 NOTE — OR Nursing (Signed)
0910 Pt's right knee prepped with chloraprep for removal of masses.  Safety strap removed from thighs and placed over abdomen.

## 2011-05-05 NOTE — Anesthesia Procedure Notes (Signed)
Procedure Name: Intubation Date/Time: 05/05/2011 7:47 AM Performed by: Jon Gills A Pre-anesthesia Checklist: Patient identified, Emergency Drugs available, Suction available and Patient being monitored Patient Re-evaluated:Patient Re-evaluated prior to inductionOxygen Delivery Method: Circle system utilized Preoxygenation: Pre-oxygenation with 100% oxygen Intubation Type: IV induction, Rapid sequence and Cricoid Pressure applied Laryngoscope Size: Miller and 2 Grade View: Grade I Tube type: Oral Tube size: 7.5 mm Number of attempts: 1 Airway Equipment and Method: Stylet and LTA kit utilized Placement Confirmation: ETT inserted through vocal cords under direct vision,  positive ETCO2 and breath sounds checked- equal and bilateral Secured at: 23 cm Tube secured with: Tape Dental Injury: Teeth and Oropharynx as per pre-operative assessment

## 2011-05-05 NOTE — Anesthesia Postprocedure Evaluation (Signed)
  Anesthesia Post-op Note  Patient: [name]  Procedure(s) Performed: Procedure(s) (LRB): LAPAROSCOPIC INCISIONAL HERNIA (N/A) EXCISION MASS (Right) INSERTION OF MESH (N/A)  Patient Location: PACU  Anesthesia Type: General  Level of Consciousness: awake, alert  and oriented  Airway and Oxygen Therapy: Patient Spontanous Breathing and Patient connected to nasal cannula oxygen  Post-op Pain: mild  Post-op Assessment: Post-op Vital signs reviewed, Patient's Cardiovascular Status Stable, Respiratory Function Stable, Patent Airway, No signs of Nausea or vomiting and Pain level controlled  Post-op Vital Signs: Reviewed and stable  Complications: No apparent anesthesia complications

## 2011-05-05 NOTE — Op Note (Signed)
Preoperative diagnosis: incisional hernia, 2 0.5 centimeter subcutaneous right knee masses Postoperative diagnosis: Same as above Procedure: #1 laparoscopic incisional hernia repair with 15 cm ventralight mesh #2 excision of 2 0.5 cm right knee subcutaneous masses Surgeon: Dr. Serita Grammes Complications: None Specimens: None Estimated blood loss: Minimal Drains: None Sponge needle count correct x2 an operation Disposition to recovery in stable condition  Indications: This is a 46 year old female with multiple medical problems who has an incisional hernia at her umbilicus has become more symptomatic. She also has 2 small subcutaneous masses in her right knee that her bothering her as well. She was maximized medically and then we discussed proceeding the operating room for a laparoscopic ventral hernia repair given its incisional in nature as well as her obesity. I also agreed to do the knee masses.  Procedure: After informed consent was obtained the patient was taken to the operating room. She was administered 2 g of intravenous cefazolin. Sequential compression devices were placed on her legs prior to beginning. She was then placed under general endotracheal anesthesia without complication. She was then prepped and draped in the standard sterile surgical fashion. A surgical timeout was then performed. A Foley catheter was inserted and an orogastric tube was placed.  I infiltrated quarter percent Marcaine in her left upper quadrant. I then accessed her using the 5 mm Optiview trocar. There was no evidence of an entry injury. I then insufflated her abdomen to 15 mm mercury pressure. I then inserted a 10 mm trocar in the left midabdomen and a 5 mm trocar in the left lower quadrant. These were done under direct vision after infiltration with local anesthetic without complication. She had some adhesions from her omentum to her incision. I took these down using a combination of blunt dissection as well  as sharp dissection. I reduced her hernia in its entirety. There was no bowel involved with this. I used the cautery to then take down some of the falciform ligament for placement of mesh. The hernia was fairly small at about 3 x 5 cm. I elected to use a 15 cm circular mesh. I then place 4 cardinal sutures of 0 Prolene. I then rolled this up and inserted it through my 10 mm trocar. I then rolled this flat. I then placed this with the hernia in the center the defect and pulled out the Prolene sutures with the Endo Close device. Once I had done this I tied these down. I then used to secure strap tacker to tack the remainder of the mesh leaving this in good position. There again was no evidence of a bowel injury and hemostasis was observed. I then I placed an additional 5 mm trocar in the right upper quadrant to complete attacks. I then removed my 10 mm trocar and closest site with a 0 Vicryl suture. I then desufflated the abdomen removed all trocars. I closed the incisions with 4 Monocryl and Dermabond.  We then prepped and draped the right anterior knee. I made a horizontal incision overlying these 2 masses. There were 2 small masses that were excised in their entirety. I then closes with 3-0 Vicryl and 4-0 Monocryl and Dermabond. She tolerated this well was extubated and transferred recovery in stable condition.

## 2011-05-05 NOTE — Progress Notes (Signed)
Admitted to room 5149, family at bedside, denies nausea, c/o abd pain 5/10 after moving self from stretcher to bed, oriented to room and surroundings.

## 2011-05-05 NOTE — Discharge Instructions (Signed)
[facility] LAPAROSCOPIC SURGERY: POST OP INSTRUCTIONS  Always review your discharge instruction sheet given to you by the facility where your surgery was performed. IF YOU HAVE DISABILITY OR FAMILY LEAVE FORMS, YOU MUST BRING THEM TO THE OFFICE FOR PROCESSING.   DO NOT GIVE THEM TO YOUR DOCTOR.  1. A prescription for pain medication may be given to you upon discharge.  Take your pain medication as prescribed, if needed.  If narcotic pain medicine is not needed, then you may take acetaminophen (Tylenol), naprosyn (Alleve), or ibuprofen (Advil) as needed. 2. Take your usually prescribed medications unless otherwise directed. 3. If you need a refill on your pain medication, please contact your pharmacy.  They will contact our office to request authorization. Prescriptions will not be filled after 5pm or on week-ends. 4. You should follow a light diet the first few days after arrival home, such as soup and crackers, etc.  Be sure to include lots of fluids daily. 5. Most patients will experience some swelling and bruising in the area of the incisions.  Ice packs will help.  Swelling and bruising can take several days to resolve.  6. It is common to experience some constipation if taking pain medication after surgery.  Increasing fluid intake and taking a stool softener (such as Colace) will usually help or prevent this problem from occurring.  A mild laxative (Milk of Magnesia or Miralax) should be taken according to package instructions if there are no bowel movements after 48 hours. 7. Unless discharge instructions indicate otherwise, you may remove your bandages 48 hours after surgery, and you may shower at that time.  You may have steri-strips (small skin tapes) in place directly over the incision.  These strips should be left on the skin for 7-10 days.  If your surgeon used skin glue on the incision, you may shower in 24 hours.  The glue will flake  off over the next 2-3 weeks.  Any sutures or staples will be removed at the office during your follow-up visit. 8. ACTIVITIES:  You may resume regular (light) daily activities beginning the next day--such as daily self-care, walking, climbing stairs--gradually increasing activities as tolerated.  You may have sexual intercourse when it is comfortable.  Refrain from any heavy lifting or straining until approved by your doctor. a. You may drive when you are no longer taking prescription pain medication, you can comfortably wear a seatbelt, and you can safely maneuver your car and apply brakes. b. RETURN TO WORK:  __________________________________________________________ 9. You should see your doctor in the office for a follow-up appointment approximately 2-3 weeks after your surgery.  Make sure that you call for this appointment within a day or two after you arrive home to insure a convenient appointment time. 10. OTHER INSTRUCTIONS: __________________________________________________________________________________________________________________________ __________________________________________________________________________________________________________________________ WHEN TO CALL YOUR DOCTOR: 1. Fever over 101.0 2. Inability to urinate 3. Continued bleeding from incision. 4. Increased pain, redness, or drainage from the incision. 5. Increasing abdominal pain  The clinic staff is available to answer your questions during regular business hours.  Please don't hesitate to call and ask to speak to one of the nurses for clinical concerns.  If you have a medical emergency, go to the nearest emergency room or call 911.  A surgeon from [facility] is always on call at the hospital. [address], [address], [place], [place]  [address] ? [address] [address], [place], [place]   [address] [phone] ? [phone] ? [phone] Web site: [email or web address]  

## 2011-05-05 NOTE — Anesthesia Preprocedure Evaluation (Addendum)
Anesthesia Evaluation  Patient identified by MRN, date of birth, ID band Patient awake    Reviewed: Allergy & Precautions, H&P , NPO status , Patient's Chart, lab work & pertinent test results  History of Anesthesia Complications Negative for: history of anesthetic complications  Airway Mallampati: I TM Distance: >3 FB Neck ROM: Full    Dental No notable dental hx. (+) Teeth Intact and Dental Advisory Given   Pulmonary asthma , sleep apnea (doesn't have CPAP) , COPD COPD inhaler,  breath sounds clear to auscultation  Pulmonary exam normal       Cardiovascular hypertension, Pt. on medications + Past MI (no documentation of coronary disease) - Valvular Problems/MurmursRhythm:Regular Rate:Normal     Neuro/Psych Seizures - (last seizure a year ago), Well Controlled,  PSYCHIATRIC DISORDERS Anxiety Depression Left side weakness H/o subdural hematoma? Had VP Shunt as child? CVA (L weakness), Residual Symptoms    GI/Hepatic Neg liver ROS, GERD-  Medicated and Poorly Controlled,  Endo/Other  negative endocrine ROSDiabetes mellitus-, Poorly Controlled, Type 2, Insulin Dependent and Oral Hypoglycemic AgentsMorbid obesity  Renal/GU negative Renal ROS     Musculoskeletal  (+) Arthritis -,   Abdominal (+) + obese,   Peds  Hematology   Anesthesia Other Findings   Reproductive/Obstetrics                       Anesthesia Physical Anesthesia Plan  ASA: III  Anesthesia Plan: General   Post-op Pain Management:    Induction: Intravenous  Airway Management Planned: Oral ETT  Additional Equipment:   Intra-op Plan:   Post-operative Plan: Extubation in OR  Informed Consent:   Dental advisory given  Plan Discussed with: CRNA, Anesthesiologist and Surgeon  Anesthesia Plan Comments: (Plan routine monitors, GETA)       Anesthesia Quick Evaluation

## 2011-05-05 NOTE — Interval H&P Note (Signed)
History and Physical Interval Note:  05/05/2011 7:30 AM  [name]  has presented today for surgery, with the diagnosis of excision of two right mass sub q masses and incisional hernia   The various methods of treatment have been discussed with the patient and family. After consideration of risks, benefits and other options for treatment, the patient has consented to  Procedure(s) (LRB): LAPAROSCOPIC INCISIONAL HERNIA (N/A) EXCISION MASS (Right) INSERTION OF MESH (N/A) as a surgical intervention .  The patients' history has been reviewed, patient examined, no change in status, stable for surgery.  I have reviewed the patients' chart and labs.  Questions were answered to the patient's satisfaction.     Matisyn Cabeza

## 2011-05-05 NOTE — H&P (Signed)
HPI  This is a 46 short female with multiple medical problems including difficult to control diabetes, history of myocardial infarction, and history of a stroke in 2003. She also has a seizure disorder for which last seizure she had was about one year ago. She was in an assisted living facility and is now living with her daughter. She has had her sugars attempted to be better controlled since then as well. There still in the 200-250.She lost some weight recently also. Over the last several years she has developed an increasingly tender, larger umbilical hernia. This area is significantly tender at this point it is causing her to not be able to do certain things. He very rarely reduces on its own she reports. She does have some nausea and vomiting I'm not sure this is related to her hernia. She also has 2 small tender masses underneath the skin of her right knee she would like evaluated. Her umbilical hernias aggravated by coughing or doing any activity really. It is relieved somewhat by lying down.  Since I last saw her her glucose is under much better control.  Past Medical History   Diagnosis  Date   .  Umbilical hernia    .  Hypertension    .  Diabetes mellitus    .  Asthma    .  Seizures    .  Nephrolithiasis    .  Stroke      last 2003    Past Surgical History   Procedure  Date   .  Kidney stone removal    .  Brain hematoma evacutation      five procedures total, first procedure when 47 months old, last at 46 years of age.   .  Vaginal hysterectomy     Family History   Problem  Relation  Age of Onset   .  Cancer  Mother       uterine    .  Cancer  Maternal Grandmother       brain    Social History  History   Substance Use Topics   .  Smoking status:  Never Smoker   .  Smokeless tobacco:  Never Used   .  Alcohol Use:  No    Allergies   Allergen  Reactions   .  Codeine  Swelling and Other (See Comments)     Swelling and burning of mouth (inside)    Current Outpatient  Prescriptions   Medication  Sig  Dispense  Refill   .  albuterol (PROVENTIL HFA;VENTOLIN HFA) 108 (90 BASE) MCG/ACT inhaler  Inhale 2 puffs into the lungs every 6 (six) hours as needed.     .  baclofen (LIORESAL) 10 MG tablet  Take 10 mg by mouth 3 (three) times daily.     .  budesonide-formoterol (SYMBICORT) 160-4.5 MCG/ACT inhaler  Inhale 2 puffs into the lungs 2 (two) times daily.     .  clopidogrel (PLAVIX) 75 MG tablet  Take 75 mg by mouth daily.     [name]  Dextromethorphan-Guaifenesin (CORICIDIN HBP CONGESTION/COUGH PO)  Take by mouth as needed.     [name]  FLUTICASONE PROPIONATE NA  Place 50 mcg into the nose.     .  gabapentin (NEURONTIN) 300 MG capsule  Take 300 mg by mouth 3 (three) times daily.     [name]  glipiZIDE (GLUCOTROL XL) 10 MG 24 hr tablet  Take 10 mg by mouth daily.     [name]  losartan (  COZAAR) 50 MG tablet  Take 50 mg by mouth daily.     .  pregabalin (LYRICA) 150 MG capsule  Take 150 mg by mouth 2 (two) times daily.     [name]  topiramate (TOPAMAX) 200 MG tablet  Take 200 mg by mouth 2 (two) times daily.     .  traZODone (DESYREL) 50 MG tablet  Take 50 mg by mouth at bedtime.      Review of Systems  Review of Systems  Blood pressure 126/78, pulse 70, temperature 97.8 F (36.6 C), temperature source Temporal, resp. rate 18, height 5' 7"  (1.702 m), weight 261 lb 6.4 oz (118.57 kg).  Physical Exam  Physical Exam  Vitals reviewed.  Constitutional: She appears well-developed and well-nourished.  Eyes: No scleral icterus.  Neck: Neck supple.  Cardiovascular: Normal rate, regular rhythm and normal heart sounds.  Pulmonary/Chest: Effort normal and breath sounds normal. She has no wheezes. She has no rales.  Abdominal: Soft. Normal appearance and bowel sounds are normal. There is tenderness in the periumbilical area. A hernia (tender difficult to reduce umbilical hernia) is present.  2 small 5 mm nodules anterior knee that are subq in nature  Musculoskeletal:  Legs: Lymphadenopathy:  She has  no cervical adenopathy.   Data Reviewed  Notes from PCP  Assessment   1. Tender UH  2. Right knee subq masses  Plan   I am concerned by her multiple medical problems as well as the fact that her sugars are not well controlled. Her umbilical hernia however is quite tender. It is difficult to reduce at this point also. This is an incisional hernia she appears to have a laparoscopic assisted vaginal hysterectomy. We discussed all of her options today. She would also like to have 2 right knee subcutaneous masses that appear to be benign excised as well. These cause her some pain. We discussed incision and removing a subcutaneous masses.  I discussed all of her options for the hernia. I Tthink that the best option is going to be to get her sugars under little bit better control over the next few weeks due to risk of infection. I also will need her to be off of her Plavix. I would be willing to leave her on an 81 mg of aspirin throughout the surgery due to her risk of stroke. I discussed a laparoscopic ventral hernia repair with mesh. I think this would be better than an open repair due to the fact that he is incisional as well as decreasing the risk of infection. I think this will prolong her hospital stay may make her recovery little bit longer but this is likely the option with the least amount of risk for her. We discussed a laparoscopic repair with mesh. The risks include but are not limited to bleeding, infection, recurrence, injury to surrounding structures, as well as medical risks including myocardial infarction and stroke.

## 2011-05-05 NOTE — Transfer of Care (Signed)
Immediate Anesthesia Transfer of Care Note  Patient: [name]  Procedure(s) Performed: Procedure(s) (LRB): LAPAROSCOPIC INCISIONAL HERNIA (N/A) EXCISION MASS (Right) INSERTION OF MESH (N/A)  Patient Location: PACU  Anesthesia Type: General  Level of Consciousness: awake, alert , oriented and patient cooperative  Airway & Oxygen Therapy: Patient Spontanous Breathing and Patient connected to face mask oxygen  Post-op Assessment: Report given to PACU RN  Post vital signs: Reviewed and stable  Complications: No apparent anesthesia complications

## 2011-05-05 NOTE — Preoperative (Signed)
Beta Blockers   Reason not to administer Beta Blockers:Not Applicable 

## 2011-05-05 NOTE — Progress Notes (Signed)
UR of chart complete.

## 2011-05-06 ENCOUNTER — Encounter (HOSPITAL_COMMUNITY): Payer: Self-pay | Admitting: General Surgery

## 2011-05-06 LAB — BASIC METABOLIC PANEL
BUN: 10 mg/dL (ref 6–23)
BUN: 9 mg/dL (ref 6–23)
CO2: 19 mEq/L (ref 19–32)
CO2: 23 mEq/L (ref 19–32)
Calcium: 8.4 mg/dL (ref 8.4–10.5)
Calcium: 8.4 mg/dL (ref 8.4–10.5)
Chloride: 106 mEq/L (ref 96–112)
Chloride: 107 mEq/L (ref 96–112)
Creatinine, Ser: 0.81 mg/dL (ref 0.50–1.10)
Creatinine, Ser: 0.77 mg/dL (ref 0.50–1.10)
GFR calc Af Amer: >90 mL/min (ref >90)
GFR calc Af Amer: >90 mL/min (ref >90)
GFR calc non Af Amer: 86 mL/min — ABNORMAL LOW (ref >90)
GFR calc non Af Amer: >90 mL/min (ref >90)
Glucose, Bld: 195 mg/dL — ABNORMAL HIGH (ref 70–99)
Glucose, Bld: 182 mg/dL — ABNORMAL HIGH (ref 70–99)
Potassium: 4.6 mEq/L (ref 3.5–5.1)
Potassium: 3.8 mEq/L (ref 3.5–5.1)
Sodium: 138 mEq/L (ref 135–145)
Sodium: 139 mEq/L (ref 135–145)

## 2011-05-06 LAB — GLUCOSE, CAPILLARY
Glucose-Capillary: 186 mg/dL — ABNORMAL HIGH (ref 70–99)
Glucose-Capillary: 152 mg/dL — ABNORMAL HIGH (ref 70–99)
Glucose-Capillary: 132 mg/dL — ABNORMAL HIGH (ref 70–99)
Glucose-Capillary: 177 mg/dL — ABNORMAL HIGH (ref 70–99)

## 2011-05-06 LAB — CBC
HCT: 36.2 % (ref 36.0–46.0)
Hemoglobin: 11.3 g/dL — ABNORMAL LOW (ref 12.0–15.0)
MCH: 29.4 pg (ref 26.0–34.0)
MCHC: 31.2 g/dL (ref 30.0–36.0)
MCV: 94.3 fL (ref 78.0–100.0)
Platelets: 255 10*3/uL (ref 150–400)
RBC: 3.84 MIL/uL — ABNORMAL LOW (ref 3.87–5.11)
RDW: 14.9 % (ref 11.5–15.5)
WBC: 9.4 10*3/uL (ref 4.0–10.5)

## 2011-05-06 MED ORDER — MORPHINE SULFATE (PF) 1 MG/ML IV SOLN
INTRAVENOUS | Status: AC
  Administered 2011-05-06: 23 mg
  Filled 2011-05-06: qty 25

## 2011-05-06 MED ORDER — WHITE PETROLATUM GEL
Status: AC
  Administered 2011-05-06: 21:00:00
  Filled 2011-05-06: qty 5

## 2011-05-06 MED ORDER — LACTATED RINGERS IV BOLUS (SEPSIS)
500.0000 mL | Freq: Once | INTRAVENOUS | Status: AC
  Administered 2011-05-06: 500 mL via INTRAVENOUS

## 2011-05-06 MED ORDER — MORPHINE SULFATE (PF) 1 MG/ML IV SOLN
INTRAVENOUS | Status: AC
  Administered 2011-05-06: 20:00:00
  Filled 2011-05-06: qty 25

## 2011-05-06 MED ORDER — MORPHINE SULFATE (PF) 1 MG/ML IV SOLN
INTRAVENOUS | Status: AC
  Administered 2011-05-06: 17.17 mg
  Filled 2011-05-06: qty 25

## 2011-05-06 MED ORDER — SODIUM CHLORIDE 0.9 % IV BOLUS (SEPSIS)
250.0000 mL | Freq: Once | INTRAVENOUS | Status: AC
  Administered 2011-05-06: 250 mL via INTRAVENOUS

## 2011-05-06 NOTE — Progress Notes (Signed)
1 Day Post-Op  Subjective: Pain controlled, no n/v, no flatus  Objective: Vital signs in last 24 hours: Temp:  [97.2 F (36.2 C)-99.1 F (37.3 C)] 99.1 F (37.3 C) (04/17 0523) Pulse Rate:  [85-105] 98  (04/17 0523) Resp:  [15-28] 20  (04/17 0523) BP: (123-142)/(79-87) 131/79 mmHg (04/17 0523) SpO2:  [97 %-100 %] 98 % (04/17 0523) Weight:  [261 lb (118.389 kg)] 261 lb (118.389 kg) (04/16 1656) Last BM Date: 05/04/11  Intake/Output from previous day: 04/16 0701 - 04/17 0700 In: 3890.6 [I.V.:3690.6; IV Piggyback:200] Out: 1675 [Urine:1675] Intake/Output this shift:    General appearance: no distress Resp: diminished breath sounds bibasilar Cardio: regular rate and rhythm GI: approp tender, bs are present, wounds clean  Lab Results:  No results found for this basename: WBC:2,HGB:2,HCT:2,PLT:2 in the last 72 hours BMET No results found for this basename: NA:2,K:2,CL:2,CO2:2,GLUCOSE:2,BUN:2,CREATININE:2,CALCIUM:2 in the last 72 hours PT/INR No results found for this basename: LABPROT:2,INR:2 in the last 72 hours ABG No results found for this basename: PHART:2,PCO2:2,PO2:2,HCO3:2 in the last 72 hours  Studies/Results: No results found.  Anti-infectives: Anti-infectives     Start     Dose/Rate Route Frequency Ordered Stop   05/05/11 1400   ceFAZolin (ANCEF) IVPB 1 g/50 mL premix        1 g 100 mL/hr over 30 Minutes Intravenous 3 times per day 05/05/11 1130     05/05/11 0632   ceFAZolin (ANCEF) IVPB 2 g/50 mL premix  Status:  Discontinued        2 g 100 mL/hr over 30 Minutes Intravenous 60 min pre-op 05/05/11 9758 05/05/11 1112   05/04/11 1439   ceFAZolin (ANCEF) IVPB 2 g/50 mL premix  Status:  Discontinued        2 g 100 mL/hr over 30 Minutes Intravenous 60 min pre-op 05/04/11 1439 05/05/11 0633          Assessment/Plan: POD #1 lap vh, right knee mass excision 1. Cont pca today 2. pulm toilet 3. antihtn restart 4. Cont foley today to monitor uop, check  bmet 5. Heparin , scds 6. Clears, await bowel function   LOS: 1 day    [facility] 05/06/2011

## 2011-05-07 LAB — GLUCOSE, CAPILLARY
Glucose-Capillary: 182 mg/dL — ABNORMAL HIGH (ref 70–99)
Glucose-Capillary: 166 mg/dL — ABNORMAL HIGH (ref 70–99)
Glucose-Capillary: 166 mg/dL — ABNORMAL HIGH (ref 70–99)
Glucose-Capillary: 218 mg/dL — ABNORMAL HIGH (ref 70–99)

## 2011-05-07 MED ORDER — MORPHINE SULFATE (PF) 1 MG/ML IV SOLN
INTRAVENOUS | Status: AC
  Filled 2011-05-07: qty 25

## 2011-05-07 MED ORDER — PANTOPRAZOLE SODIUM 40 MG IV SOLR
40.0000 mg | Freq: Every day | INTRAVENOUS | Status: DC
Start: 2011-05-07 — End: 2011-05-07
  Filled 2011-05-07 (×2): qty 40

## 2011-05-07 MED ORDER — PANTOPRAZOLE SODIUM 40 MG PO TBEC
40.0000 mg | DELAYED_RELEASE_TABLET | Freq: Two times a day (BID) | ORAL | Status: DC
  Administered 2011-05-07 – 2011-05-08 (×2): 40 mg via ORAL
  Filled 2011-05-07 (×2): qty 1

## 2011-05-07 MED ORDER — HYDRALAZINE HCL 20 MG/ML IJ SOLN
5.0000 mg | INTRAMUSCULAR | Status: DC | PRN
  Administered 2011-05-07: 5 mg via INTRAVENOUS
  Filled 2011-05-07: qty 0.25

## 2011-05-07 MED ORDER — MORPHINE SULFATE (PF) 1 MG/ML IV SOLN
INTRAVENOUS | Status: AC
  Administered 2011-05-07: 10:00:00
  Filled 2011-05-07: qty 25

## 2011-05-07 NOTE — Progress Notes (Signed)
2 Days Post-Op  Subjective: No flatus, pain fair control  Objective: Vital signs in last 24 hours: Temp:  [98.1 F (36.7 C)-100.2 F (37.9 C)] 100.2 F (37.9 C) (04/18 0553) Pulse Rate:  [94-104] 104  (04/18 0553) Resp:  [12-25] 21  (04/18 0755) BP: (127-151)/(74-91) 151/91 mmHg (04/18 0553) SpO2:  [94 %-100 %] 94 % (04/18 0755) Last BM Date: 05/04/11  Intake/Output from previous day: 04/17 0701 - 04/18 0700 In: 2566 [I.V.:2566] Out: 1450 [Urine:1450] Intake/Output this shift:    General appearance: no distress Resp: diminished breath sounds bibasilar Cardio: regular rate and rhythm GI: bs present, appropr tender, obese, wounds clean  Lab Results:   [facility] 05/06/11 0640  WBC 9.4  HGB 11.3*  HCT 36.2  PLT 255   BMET  Basename 05/06/11 0937 05/06/11 0640  NA 139 138  K 3.8 4.6  CL 107 106  CO2 23 19  GLUCOSE 182* 195*  BUN 9 10  CREATININE 0.77 0.81  CALCIUM 8.4 8.4      Assessment/Plan: POD #2  Lap ventral hernia 1. Neuro- cont pca due to pain 2. cv- cont antihtn 3. pulm toilet- she needs to be up today 4. Dc foley (I thought was out yesterday) 5. Sq heparin, scds 6. Monitor cbgs 7. Clear liquids   LOS: 2 days    [place] [facility] 05/07/2011

## 2011-05-07 NOTE — Evaluation (Signed)
Physical Therapy Evaluation Patient Details Name: [name] MRN: [id number] DOB: 08-14-1965 Today's Date: 05/07/2011  Problem List:  Patient Active Problem List  Diagnoses  . DIABETES MELLITUS, TYPE II  . HYPERLIPIDEMIA  . DEPRESSION  . MIGRAINE HEADACHE  . BLINDNESS, LEGAL, [place] DEFINITION  . HYPERTENSION  . MYOCARDIAL INFARCTION, HX OF  . CVA  . GERD  . SEIZURE DISORDER  . DIZZINESS    Past Medical History:  Past Medical History  Diagnosis Date  . Umbilical hernia   . Hypertension   . Asthma   . Seizures   . Stroke     last 2003  . Heart murmur     born with   . Dysrhythmia     skipped beats  . Myocardial infarction     hi of x 2  last one 2003   . COPD (chronic obstructive pulmonary disease)     chronic bronchitis   . Pneumonia     hx of 2009  . GERD (gastroesophageal reflux disease)   . Nephrolithiasis     frequent urination , urination at nite   . Glaucoma   . Legally blind     left eye   . Headache     migraines   . Neuropathy     due to diabetes   . Dizziness     secondary to diabetes and hypertension   . Arthritis     joint pain   . Hx of blood clots     hematomas removed from left side of brain from 48mo to 89yrold   . Epilepsy idiopathic petit mal     last seizure 2012;controlled w/ topomax  . Diabetic neuropathy, painful   . Diabetes mellitus     since age 89type 2 IDDM  . Sleep apnea     [facility];does not use Cpap ; mild   Past Surgical History:  Past Surgical History  Procedure Date  . Kidney stone removal   . Brain hematoma evacutation     five procedures total, first procedure when [age]onths old, last at [age]ears of age.  . Shunt removal     shunt inserted at age [age]emoved at age 46 . Eye surgery   . Retinal detachment surgery 1990  . Vaginal hysterectomy 1996  . Umbilical hernia repair 04/23/54/2563. Incisional hernia repair 05/05/2011    Procedure: LAPAROSCOPIC INCISIONAL HERNIA;  Surgeon:  MaRolm BookbinderMD;  [place] [facility] Service: General;  Laterality: N/A;  . Mass excision 05/05/2011    Procedure: EXCISION MASS;  Surgeon: MaRolm BookbinderMD;  [place] [facility] Service: General;  Laterality: Right;    PT Assessment/Plan/Recommendation PT Assessment Clinical Impression Statement: [name] [employer] s/p laparoscopic umbilical hernia repair and excision of 2 0.5 cm right knee subcutaneous masses POD #2. Presents to PT today with limited mobility secondary to pain and general fatigue following surgery. Will benefit physical therapy in the acute setting to address these and the following impairments so as to maximize safety for d/c home. Rec. HHPT, 3in1 and bariatric RW for d/c as well as 24 hour assist/supervision. She will need to be able to ambulate at least 50-100 ft prior to her d/c as well as go up 3-4 steps with no railings as this is how she will get into her apartment. If she is not able to accomplish this we may need to consider ST-SNF. Will see her tomorrow am.  PT Recommendation/Assessment: Patient will need skilled PT in the acute care venue PT Problem List: Decreased activity tolerance;Decreased mobility;Decreased cognition;Decreased knowledge of use of DME;Obesity;Pain Barriers to Discharge: Inaccessible home environment Barriers to Discharge Comments: has to ascend 3-4 steps with no rails to get into her apartment PT Therapy Diagnosis : Difficulty walking;Acute pain;Abnormality of gait;Generalized weakness PT Plan PT Frequency: Min 3X/week PT Treatment/Interventions: DME instruction;Gait training;Stair training;Functional mobility training;Balance training;Therapeutic exercise;Therapeutic activities;Neuromuscular re-education;Cognitive remediation;Patient/family education PT Recommendation Recommendations for Other Services: OT consult Follow Up Recommendations: Supervision/Assistance - 24 hour;Home health PT Equipment Recommended: Rolling walker with 5" wheels  (bariatric); 3 in 1 bedside comode (pt reports she also needs a hospital bed) Pt initially not wanting a 3in1 but given the fact that a shower chair would not fit into her shower she will need something to sit on to bathe; 3in1 can be use as a toilet riser as well as for bathing PT Goals  Acute Rehab PT Goals PT Goal Formulation: With patient Time For Goal Achievement: 2 weeks Pt will go Supine/Side to Sit: with modified independence PT Goal: Supine/Side to Sit - Progress: Goal set today Pt will go Sit to Supine/Side: with modified independence PT Goal: Sit to Supine/Side - Progress: Goal set today Pt will go Sit to Stand: with modified independence PT Goal: Sit to Stand - Progress: Goal set today Pt will go Stand to Sit: with modified independence PT Goal: Stand to Sit - Progress: Goal set today Pt will Transfer Bed to Chair/Chair to Bed: with modified independence PT Transfer Goal: Bed to Chair/Chair to Bed - Progress: Goal set today Pt will Ambulate: 51 - 150 feet;with least restrictive assistive device;with modified independence PT Goal: Ambulate - Progress: Goal set today Pt will Go Up / Down Stairs: 3-5 stairs;with min assist;with least restrictive assistive device PT Goal: Up/Down Stairs - Progress: Goal set today  PT Evaluation Precautions/Restrictions  Precautions Precautions: Fall Prior Functioning  Home Living Lives With: Daughter Available Help at Discharge: Family;Home health;Available 24 hours/day Type of Home: Apartment Home Access: Stairs to enter Entrance Stairs-Number of Steps: 3-4 Entrance Stairs-Rails: None Home Layout: One level Bathroom Shower/Tub: Walk-in shower (too small for shower chair per pt) Bathroom Toilet: Standard Home Adaptive Equipment: Straight cane Prior Function Level of Independence: Needs assistance Needs Assistance: Light Housekeeping;Meal Prep;Toileting;Dressing;Bathing Bath: Minimal Dressing: Minimal Toileting: Minimal Meal Prep:  Total Light Housekeeping: Total Able to Take Stairs?: Yes Driving: No Vocation: On disability Comments: pt reports fiance is there for her 24 hours/day but is on disability, he reports he can physical assist her however; she also reports that she will be getting a CNA (paid for by medicaid, but that they are family so that they will be there regardless of medicaid; reports that she needs a hospital bed because of her sleep apnea Cognition Cognition Arousal/Alertness: Awake/alert Overall Cognitive Status: Impaired Orientation Level: Oriented X4 Cognition - Other Comments: pt with slower speech, almost child like and timid; definitely aware of her need for assist, almost asking for everything she can have  Sensation/Coordination Sensation Light Touch: Appears Intact Coordination Gross Motor Movements are Fluid and Coordinated: Yes Fine Motor Movements are Fluid and Coordinated: Yes Extremity Assessment RUE Assessment RUE Assessment: Within Functional Limits LUE Assessment LUE Assessment: Within Functional Limits RLE Assessment RLE Assessment: Within Functional Limits LLE Assessment LLE Assessment: Within Functional Limits Mobility (including Balance) Bed Mobility Bed Mobility: No (pt sitting up in recliner) Transfers Transfers: Yes Sit to Stand: From chair/3-in-1;With armrests (mingaurdA) Sit  to Stand Details (indicate cue type and reason): cues for hand placement; pt slow to stand and limited primarily by pain Stand to Sit: With armrests;To chair/3-in-1;With upper extremity assist (mingaurdA) Stand to Sit Details: pt again slow to move because of abdominal pain; cues for safe technique Ambulation/Gait Ambulation/Gait: Yes Ambulation/Gait Assistance: 4: Min assist Ambulation/Gait Assistance Details (indicate cue type and reason): amb with min tactile cues for upright posture and safe technique for RW Ambulation Distance (Feet): 2 Feet Assistive device: Rolling walker Gait  Pattern: Trunk flexed;Shuffle;Decreased hip/knee flexion - right;Decreased hip/knee flexion - left;Decreased step length - left;Decreased step length - right Stairs: No    Exercise    End of Session PT - End of Session Equipment Utilized During Treatment: Gait belt Activity Tolerance: Patient limited by pain Patient left: with call bell in reach;with family/visitor present;in chair General Behavior During Session: [facility] for tasks performed  [place] 05/07/2011, 10:02 AM

## 2011-05-07 NOTE — Plan of Care (Signed)
Problem: Discharge Progression Outcomes Goal: Activity appropriate for discharge plan Outcome: Progressing Pt will need to be able to ambulate at least 50-100 ft and get up 3-4 steps to get into her apartment.

## 2011-05-07 NOTE — Progress Notes (Signed)
Spoke with pt about any needs for discharge. She has Medicaid as her primary coverage and stated that copays for medicine were not an issue for her.  She requested that she have a hospital bed and a quad cane at d/c because she had previously used a single point cane to walk at home and was having falls. Discussed with her difference between quad cane and rolling walker.  Noted that PT eval was already ordered.  Await their eval to determine pt's exact needs for home. Pt also asking for a nurse aide at d/c.  Typically those need to be coupled with a skilled need such as RN or PT.  Will look closer to d/c to see pt's discharge needs and arrange appropriately.

## 2011-05-08 ENCOUNTER — Inpatient Hospital Stay (HOSPITAL_COMMUNITY): Payer: PRIVATE HEALTH INSURANCE

## 2011-05-08 DIAGNOSIS — R000 Tachycardia, unspecified: Secondary | ICD-10-CM | POA: Diagnosis not present

## 2011-05-08 LAB — BASIC METABOLIC PANEL
BUN: 6 mg/dL (ref 6–23)
CO2: 19 mEq/L (ref 19–32)
Calcium: 8.8 mg/dL (ref 8.4–10.5)
Chloride: 104 mEq/L (ref 96–112)
Creatinine, Ser: 0.71 mg/dL (ref 0.50–1.10)
GFR calc Af Amer: >90 mL/min (ref >90)
GFR calc non Af Amer: >90 mL/min (ref >90)
Glucose, Bld: 215 mg/dL — ABNORMAL HIGH (ref 70–99)
Potassium: 3.8 mEq/L (ref 3.5–5.1)
Sodium: 133 mEq/L — ABNORMAL LOW (ref 135–145)

## 2011-05-08 LAB — CBC
HCT: 36.7 % (ref 36.0–46.0)
Hemoglobin: 11.6 g/dL — ABNORMAL LOW (ref 12.0–15.0)
MCH: 28.9 pg (ref 26.0–34.0)
MCHC: 31.6 g/dL (ref 30.0–36.0)
MCV: 91.5 fL (ref 78.0–100.0)
Platelets: 250 10*3/uL (ref 150–400)
RBC: 4.01 MIL/uL (ref 3.87–5.11)
RDW: 14.5 % (ref 11.5–15.5)
WBC: 10.4 10*3/uL (ref 4.0–10.5)

## 2011-05-08 LAB — GLUCOSE, CAPILLARY
Glucose-Capillary: 186 mg/dL — ABNORMAL HIGH (ref 70–99)
Glucose-Capillary: 190 mg/dL — ABNORMAL HIGH (ref 70–99)
Glucose-Capillary: 171 mg/dL — ABNORMAL HIGH (ref 70–99)

## 2011-05-08 LAB — MRSA PCR SCREENING: MRSA by PCR: NEGATIVE

## 2011-05-08 MED ORDER — MORPHINE SULFATE (PF) 1 MG/ML IV SOLN
INTRAVENOUS | Status: AC
  Administered 2011-05-08: 13:00:00
  Filled 2011-05-08: qty 25

## 2011-05-08 MED ORDER — ENOXAPARIN SODIUM 40 MG/0.4ML ~~LOC~~ SOLN
40.0000 mg | SUBCUTANEOUS | Status: DC
  Administered 2011-05-08 – 2011-05-09 (×2): 40 mg via SUBCUTANEOUS
  Filled 2011-05-08 (×3): qty 0.4

## 2011-05-08 MED ORDER — LIDOCAINE HCL 2 % EX GEL
Freq: Once | CUTANEOUS | Status: AC
  Administered 2011-05-08: 20
  Filled 2011-05-08: qty 5

## 2011-05-08 MED ORDER — SODIUM CHLORIDE 0.9 % IJ SOLN
INTRAMUSCULAR | Status: AC
  Filled 2011-05-08: qty 10

## 2011-05-08 MED ORDER — BENZOCAINE (TOPICAL) 20 % EX AERO
INHALATION_SPRAY | Freq: Once | CUTANEOUS | Status: AC
  Administered 2011-05-08: 16:00:00 via OROMUCOSAL
  Filled 2011-05-08: qty 57

## 2011-05-08 MED ORDER — BISACODYL 10 MG RE SUPP
10.0000 mg | Freq: Once | RECTAL | Status: AC
  Administered 2011-05-08: 10 mg via RECTAL
  Filled 2011-05-08: qty 1

## 2011-05-08 MED ORDER — FUROSEMIDE 10 MG/ML IJ SOLN
10.0000 mg | Freq: Once | INTRAMUSCULAR | Status: AC
  Administered 2011-05-08: 10 mg via INTRAVENOUS
  Filled 2011-05-08: qty 1

## 2011-05-08 MED ORDER — PANTOPRAZOLE SODIUM 40 MG IV SOLR
40.0000 mg | Freq: Two times a day (BID) | INTRAVENOUS | Status: DC
  Administered 2011-05-08 – 2011-05-09 (×2): 40 mg via INTRAVENOUS
  Filled 2011-05-08 (×4): qty 40

## 2011-05-08 NOTE — Progress Notes (Signed)
Patient transferred to 3307 and report given to receiving nurse [name].

## 2011-05-08 NOTE — Progress Notes (Signed)
05/08/2011 1820 Patient arrived from 5100 in bed. VSS. Patient oriented to unit, bed in low position, call bell with in reach. Will continue to monitor. [name]

## 2011-05-08 NOTE — Progress Notes (Signed)
Physical Therapy Treatment Patient Details Name: [name] MRN: [id number] DOB: [birth year] Today's Date: 05/08/2011 Time: 6979-4801 PT Time Calculation (min): 36 min  PT Assessment / Plan / Recommendation Comments on Treatment Session  Able to ambulate further today but still in significant pain which increased from a 5 at rest to an 8 after ambulation. Pt's heart rate is also an issue, increasing from 112 to 130 during ambulation requiring therapist cueing for relaxing breaths and seated rest break.     Follow Up Recommendations  Home health PT with 24 hour supervision/assist vs Skilled nursing facility (if pt unable to ambulate 50 ft and complete steps we may need to consider ST-SNF)    Equipment Recommendations  Rolling walker with 5" wheels;3 in 1 bedside comode (hospital bed)    Frequency      Precautions / Restrictions Precautions Precautions: Fall   Pertinent Vitals/Pain Heart rate resting at 112 but increased to 130 with ambulation, came back down with seated rest break to 116-117 Pain increased from 5->8 with ambulation    Mobility  Bed Mobility Bed Mobility: Rolling Left;Left Sidelying to Sit Rolling Left: 4: Min guard;With rail Left Sidelying to Sit: 4: Min assist;With rails;HOB elevated (50 degrees) Details for Bed Mobility Assistance: step by step sequencing cues and min tactile assist for follow through to sit up Transfers Transfers: Sit to Stand;Stand to Sit Sit to Stand: 1: +2 Total assist Sit to Stand: Patient Percentage: 90% Stand to Sit: 4: Min guard Details for Transfer Assistance: cues for safe technique specifically hand placement; pt slow moving due to pain Ambulation/Gait Ambulation/Gait Assistance: 4: Min guard Assistive device: Rolling walker Ambulation/Gait Assistance Details: cues for safe proximity to RW and forward gaze; cues for rest breaks as HR increased to 130 and slower controlled relaxing breaths to ease SOB and HR Gait Pattern:  Step-through pattern;Trunk flexed;Shuffle         PT Goals Acute Rehab PT Goals PT Goal Formulation: With patient Time For Goal Achievement: 05/21/11 Pt will Roll Supine to Right Side: with modified independence PT Goal: Rolling Supine to Right Side - Progress: Goal set today Pt will Roll Supine to Left Side: with modified independence PT Goal: Rolling Supine to Left Side - Progress: Goal set today Pt will go Supine/Side to Sit: with modified independence PT Goal: Supine/Side to Sit - Progress: Progressing toward goal Pt will go Sit to Stand: with modified independence PT Goal: Sit to Stand - Progress: Progressing toward goal Pt will go Stand to Sit: with modified independence PT Goal: Stand to Sit - Progress: Progressing toward goal Pt will Transfer Bed to Chair/Chair to Bed: with modified independence PT Transfer Goal: Bed to Chair/Chair to Bed - Progress: Progressing toward goal Pt will Ambulate: 51 - 150 feet;with modified independence;with least restrictive assistive device PT Goal: Ambulate - Progress: Progressing toward goal  Visit Information  Last PT Received On: 05/08/11    Subjective Data  Subjective: My doctor said I probably won't be going home anytime soon.    Cognition  Overall Cognitive Status: Impaired (slower processing (possibly due to morphine)) Arousal/Alertness: Awake/alert Orientation Level: Appears intact for tasks assessed Behavior During Session: [facility] for tasks performed    Balance     End of Session Pt left in recliner with fiance present and call bell within reach.     [facility] 05/08/2011, 12:25 PM Pager: [phone]

## 2011-05-08 NOTE — Progress Notes (Signed)
Tried to insert NGT per  order using benzocaine spray and lidocaine jelly, patient stated that it is too painful. Patient claiming that she she was put to sleep the last time she have one and that she will only take one if she will be put to sleep. Explained to the patient the need of the NGT, patient continue to refuse treatment. MD on call paged and notified about the patient refusal of treatment. Will monitor.

## 2011-05-08 NOTE — Consult Note (Signed)
Patient ID: [name] MRN: [id number], DOB/AGE: 07/24/1965   Admit date: 05/05/2011 Date of Consult: @TODAY @  Primary Physician: Philis Fendt, MD, MD Primary Cardiologist: New    Problem List: Past Medical History  Diagnosis Date  . Umbilical hernia   . Hypertension   . Asthma   . Seizures   . Stroke     last 2003  . Heart murmur     born with   . Dysrhythmia     skipped beats  . Myocardial infarction     hi of x 2  last one 2003   . COPD (chronic obstructive pulmonary disease)     chronic bronchitis   . Pneumonia     hx of 2009  . GERD (gastroesophageal reflux disease)   . Nephrolithiasis     frequent urination , urination at nite   . Glaucoma   . Legally blind     left eye   . Headache     migraines   . Neuropathy     due to diabetes   . Dizziness     secondary to diabetes and hypertension   . Arthritis     joint pain   . Hx of blood clots     hematomas removed from left side of brain from 59mo to 55yrold   . Epilepsy idiopathic petit mal     last seizure 2012;controlled w/ topomax  . Diabetic neuropathy, painful   . Diabetes mellitus     since age 89type 2 IDDM  . Sleep apnea     [facility];does not use Cpap ; mild    Past Surgical History  Procedure Date  . Kidney stone removal   . Brain hematoma evacutation     five procedures total, first procedure when [age]onths old, last at [age]ears of age.  . Shunt removal     shunt inserted at age [age]emoved at age 46 . Eye surgery   . Retinal detachment surgery 1990  . Vaginal hysterectomy 1996  . Umbilical hernia repair 04/20/19/9758. Incisional hernia repair 05/05/2011    Procedure: LAPAROSCOPIC INCISIONAL HERNIA;  Surgeon: MaRolm BookbinderMD;  [facility]: General;  Laterality: N/A;  . Mass excision 05/05/2011    Procedure: EXCISION MASS;  Surgeon: MaRolm BookbinderMD;  [place] [facility]: General;  Laterality: Right;     Allergies:  Allergies    Allergen Reactions  . Codeine Swelling and Other (See Comments)    Swelling and burning of mouth (inside)    HPI: patient is a [age]ear old who we are asked to see re tachycardia. Patient was admitted on 4/16.  She underwent excision  Of R knee subcutaneous masses as well as laproscopic repair of an incisional hernia.  Post top complicated by pain and fatigue.  Today noted to be tachycardic.  Still with ileus.  EKG today shows sinus tachycardia 110 bpm.  Patient reports she has had a " heart attack" in the past around time of stroke.  2003.  Around time of stroke.  NOw says it was CP. She has never had cath though.  Admits it was just chest pain    She has not had CP recently   Says she was active prior to surgery.     Inpatient Medications:    . antiseptic oral rinse  15 mL Mouth Rinse q12n4p  . baclofen  10 mg Oral TID  .  bisacodyl  10 mg Rectal Once  . budesonide-formoterol  2 puff Inhalation BID  . chlorhexidine  15 mL Mouth Rinse BID  . enoxaparin (LOVENOX) injection  40 mg Subcutaneous Q24H  . furosemide  10 mg Intravenous Once  . gabapentin  300 mg Oral TID  . insulin aspart  0-15 Units Subcutaneous TID WC  . losartan  50 mg Oral Daily  . morphine   Intravenous Q4H  . morphine      . morphine      . pantoprazole (PROTONIX) IV  40 mg Intravenous Q12H  . topiramate  200 mg Oral BID  . DISCONTD: pantoprazole  40 mg Oral BID AC  . DISCONTD: traZODone  150 mg Oral QHS    Family History  Problem Relation Age of Onset  . Cancer Mother     uterine  . Cancer Maternal Grandmother     brain  . Anesthesia problems Neg Hx      History   Social History  . Marital Status: Widowed    Spouse Name: N/A    Number of Children: N/A  . Years of Education: N/A   Occupational History  . Not on file.   Social History Main Topics  . Smoking status: Former Smoker -- 1.0 packs/day for 30 years    Types: Cigarettes    Quit date: 01/20/1987  . Smokeless tobacco: Never Used  .  Alcohol Use: No  . Drug Use: No     hx of marijuana use no longer uses   . Sexually Active: Not Currently    Birth Control/ Protection: Surgical   Other Topics Concern  . Not on file   Social History Narrative  . No narrative on file     Review of Systems:  All systems reviewed, negative to the above problem except as noted above.  Physical Exam: BP:  123/89.  Pulse 100s to 130.  T 97.8.  RR 24  Intake/Output Summary (Last 24 hours) at 05/08/11 1418 Last data filed at 05/08/11 0600  Gross per 24 hour  Intake   1150 ml  Output    400 ml  Net    750 ml    General: Well developed, well nourished, in mild distress because of abdominal pain. Head: Normocephalic, atraumatic, sclera non-icteric Neck: Negative for carotid bruits. JVP not elevated. Lungs: Clear bilaterally to auscultation anteriorly without wheezes, rales, or rhonchi. Breathing is unlabored. Heart: RRR with S1 S2. No murmurs, rubs, or gallops appreciated. Abdomen: Full exam deferred because of patient discomfort.  Patient just seen by Dr. Donne Hazel by her report. Msk:  Moves all extremities. Extremities: No clubbing, cyanosis or edema.  Distal pedal pulses are 2+ and equal bilaterally. Neuro: Alert and oriented X 3.Psych:  Responds to questions appropriately with a normal affect.  Labs: Results for orders placed during the hospital encounter of 05/05/11 (from the past 24 hour(s))  GLUCOSE, CAPILLARY     Status: Abnormal   Collection Time   05/07/11  5:12 PM      Component Value Range   Glucose-Capillary 166 (*) 70 - 99 (mg/dL)   Comment 1 Notify RN    GLUCOSE, CAPILLARY     Status: Abnormal   Collection Time   05/07/11 10:06 PM      Component Value Range   Glucose-Capillary 218 (*) 70 - 99 (mg/dL)   Comment 1 Documented in Chart     Comment 2 Notify RN    CBC     Status: Abnormal  Collection Time   05/08/11  6:25 AM      Component Value Range   WBC 10.4  4.0 - 10.5 (K/uL)   RBC 4.01  3.87 - 5.11  (MIL/uL)   Hemoglobin 11.6 (*) 12.0 - 15.0 (g/dL)   HCT 36.7  36.0 - 46.0 (%)   MCV 91.5  78.0 - 100.0 (fL)   MCH 28.9  26.0 - 34.0 (pg)   MCHC 31.6  30.0 - 36.0 (g/dL)   RDW 14.5  11.5 - 15.5 (%)   Platelets 250  150 - 400 (K/uL)  BASIC METABOLIC PANEL     Status: Abnormal   Collection Time   05/08/11  6:25 AM      Component Value Range   Sodium 133 (*) 135 - 145 (mEq/L)   Potassium 3.8  3.5 - 5.1 (mEq/L)   Chloride 104  96 - 112 (mEq/L)   CO2 19  19 - 32 (mEq/L)   Glucose, Bld 215 (*) 70 - 99 (mg/dL)   BUN 6  6 - 23 (mg/dL)   Creatinine, Ser 0.71  0.50 - 1.10 (mg/dL)   Calcium 8.8  8.4 - 10.5 (mg/dL)   GFR calc non Af Amer >90  >90 (mL/min)   GFR calc Af Amer >90  >90 (mL/min)  GLUCOSE, CAPILLARY     Status: Abnormal   Collection Time   05/08/11  7:43 AM      Component Value Range   Glucose-Capillary 186 (*) 70 - 99 (mg/dL)   Comment 1 Notify RN    GLUCOSE, CAPILLARY     Status: Abnormal   Collection Time   05/08/11 12:13 PM      Component Value Range   Glucose-Capillary 190 (*) 70 - 99 (mg/dL)   Comment 1 Notify RN      Radiology/Studies: Dg Chest 2 View  04/24/2011  *RADIOLOGY REPORT*  Clinical Data: Productive cough, body aches, shortness of breath  CHEST - 2 VIEW  Comparison: Chest x-ray of 03/03/2011  Findings: No active infiltrate or effusion is seen.  There is some peribronchial thickening which may indicate bronchitis.  The heart is within upper limits of normal.  No acute bony abnormality is seen.  IMPRESSION: No pneumonia.  Question bronchitis.  Original Report Authenticated By: Joretta Bachelor, M.D.   Dg Abd Acute W/chest  05/08/2011  *RADIOLOGY REPORT*  Clinical Data: Shortness of breath.  Weakness.  Abdominal pain.  ACUTE ABDOMEN SERIES (ABDOMEN 2 VIEW & CHEST 1 VIEW)  Comparison: Two-view chest 04/24/2011.  Findings: The lung volumes are low.  Right middle or lower lobe airspace disease is new.  Mild left basilar atelectasis is new as well.  Mild pulmonary vascular  congestion is evident.  Dilated loops of both large and small bowel are evident.  There are fluid levels in small bowel.  No free air is present.  IMPRESSION:  1.  The right middle or lower lobe airspace disease likely reflects atelectasis. 2.  Low lung volumes and mild left basilar atelectasis. 3.  Mild pulmonary vascular congestion. 4.  Dilated loops of small bowel with fluid levels.  This suggests a partial small bowel obstruction as there is also gaseous distention of large bowel.  The overall picture may be that of an ileus.  Original Report Authenticated By: Resa Miner. MATTERN, M.D.    EKG:  Sinus tachycardia.  ASSESSMENT AND PLAN:   Asked to see regarding tachycardia.  Evaluation shows only sinus tachycardia.  This is usually the result of a  physiologic stress.   First noted today. Patient currently with significant abdominal pain.   Abd film shows dilated loops of small bowel with fluid levels.  Consistent with partial SBO or ileus.  Both of these could contribute to tachycardia. Overall volume status does not look bad.  WOuld watch I/Os closl. EKG without evidence of ischemia  In regards to past cardiac history I am not convinced she had a heart attack.  Had CP  Records at [facility]  Never had cath.   I would follow.  COntinue telemetry.  Watch I/Os closely. Treat pain and GI problem.  Please call if cardiac condition deteriorates.     Signed, Dorris Carnes 05/08/2011, 2:18 PM

## 2011-05-08 NOTE — Progress Notes (Signed)
UR complete 

## 2011-05-08 NOTE — Progress Notes (Signed)
3 Days Post-Op  Subjective: No flatus/bm yet, no n/v  Objective: Vital signs in last 24 hours: Temp:  [92 F (33.3 C)-99.6 F (37.6 C)] 97.8 F (36.6 C) (04/19 0510) Pulse Rate:  [106-121] 112  (04/19 0510) Resp:  [17-28] 27  (04/19 0805) BP: (123-152)/(89-107) 123/89 mmHg (04/19 0510) SpO2:  [95 %-98 %] 97 % (04/19 0805) Last BM Date: 05/04/11  Intake/Output from previous day: 04/18 0701 - 04/19 0700 In: 1150 [I.V.:1150] Out: 1250 [Urine:1250] Intake/Output this shift:    General appearance: no distress Resp: diminished breath sounds bibasilar Cardio: tachycardic rr GI: mildly distended, bs present, wounds clean, appropr tender  Lab Results:   Basename 05/08/11 0625 05/06/11 0640  WBC 10.4 9.4  HGB 11.6* 11.3*  HCT 36.7 36.2  PLT 250 255    Assessment/Plan: POD #3 lap ventral hernia 1. Neuro- cont pca for now 2. Pulm toilet, may need lasix today, cont albuterol, will check cxr today 3. She still has ileus, wbc normal, I do not think she has bowel injury.  I think tachycardia is not from that and her abdomen is not really tender at all right now.  That is still a concern though and will monitor. 4. Check ekg given tachycardia today 5. subq heparin, scds   LOS: 3 days    [facility] 05/08/2011

## 2011-05-08 NOTE — Progress Notes (Signed)
She is still tachycardic, her abdomen is really not tender, her wbc has been normal, she is afebrile, she is requiring oxygen.  I don't think she has unrecognized bowel injury right now.  Her films show an ileus which is what she has clinically.  I will plan on following her exam right now.  She is sinus tachy on ekg and I have asked cards to come see for any further recs on that.  I have given her some lasix as well to see if that would be helpful.

## 2011-05-09 ENCOUNTER — Inpatient Hospital Stay (HOSPITAL_COMMUNITY): Payer: PRIVATE HEALTH INSURANCE

## 2011-05-09 DIAGNOSIS — K2100 Gastro-esophageal reflux disease with esophagitis, without bleeding: Secondary | ICD-10-CM

## 2011-05-09 DIAGNOSIS — E119 Type 2 diabetes mellitus without complications: Secondary | ICD-10-CM

## 2011-05-09 LAB — BASIC METABOLIC PANEL
BUN: 9 mg/dL (ref 6–23)
CO2: 21 mEq/L (ref 19–32)
Calcium: 8.3 mg/dL — ABNORMAL LOW (ref 8.4–10.5)
Chloride: 112 mEq/L (ref 96–112)
Creatinine, Ser: 0.84 mg/dL (ref 0.50–1.10)
GFR calc Af Amer: >90 mL/min (ref >90)
GFR calc non Af Amer: 82 mL/min — ABNORMAL LOW (ref >90)
Glucose, Bld: 163 mg/dL — ABNORMAL HIGH (ref 70–99)
Potassium: 3.7 mEq/L (ref 3.5–5.1)
Sodium: 140 mEq/L (ref 135–145)

## 2011-05-09 LAB — GLUCOSE, CAPILLARY
Glucose-Capillary: 137 mg/dL — ABNORMAL HIGH (ref 70–99)
Glucose-Capillary: 167 mg/dL — ABNORMAL HIGH (ref 70–99)
Glucose-Capillary: 148 mg/dL — ABNORMAL HIGH (ref 70–99)
Glucose-Capillary: 168 mg/dL — ABNORMAL HIGH (ref 70–99)

## 2011-05-09 LAB — CBC
HCT: 33.1 % — ABNORMAL LOW (ref 36.0–46.0)
Hemoglobin: 10.3 g/dL — ABNORMAL LOW (ref 12.0–15.0)
MCH: 28.5 pg (ref 26.0–34.0)
MCHC: 31.1 g/dL (ref 30.0–36.0)
MCV: 91.7 fL (ref 78.0–100.0)
Platelets: 224 10*3/uL (ref 150–400)
RBC: 3.61 MIL/uL — ABNORMAL LOW (ref 3.87–5.11)
RDW: 14.7 % (ref 11.5–15.5)
WBC: 10.9 10*3/uL — ABNORMAL HIGH (ref 4.0–10.5)

## 2011-05-09 MED ORDER — LACTATED RINGERS IV SOLN: INTRAVENOUS | Status: DC

## 2011-05-09 MED ORDER — TRAMADOL HCL 50 MG PO TABS
100.0000 mg | ORAL_TABLET | Freq: Four times a day (QID) | ORAL | Status: DC | PRN
  Administered 2011-05-09 – 2011-05-10 (×3): 100 mg via ORAL
  Filled 2011-05-09 (×3): qty 2

## 2011-05-09 MED ORDER — HYDROMORPHONE HCL PF 1 MG/ML IJ SOLN
0.5000 mg | INTRAMUSCULAR | Status: DC | PRN
  Administered 2011-05-11 – 2011-05-17 (×31): 1 mg via INTRAVENOUS
  Filled 2011-05-09 (×30): qty 1

## 2011-05-09 MED ORDER — MORPHINE SULFATE (PF) 1 MG/ML IV SOLN
INTRAVENOUS | Status: AC
  Filled 2011-05-09: qty 25

## 2011-05-09 MED ORDER — SODIUM CHLORIDE 0.9 % IJ SOLN
INTRAMUSCULAR | Status: AC
  Administered 2011-05-09: 10 mL
  Filled 2011-05-09: qty 10

## 2011-05-09 MED ORDER — PANTOPRAZOLE SODIUM 40 MG PO TBEC: 40.0000 mg | DELAYED_RELEASE_TABLET | Freq: Every day | ORAL | Status: DC

## 2011-05-09 NOTE — Progress Notes (Signed)
Report called to receiving RN Hassan Rowan on 5100.  Pt transferred to 5150 via wheelchair with all belongings.  Pt to notify family.   Vista Lawman, RN

## 2011-05-09 NOTE — Progress Notes (Signed)
GS Progress Note Subjective: Patient is doing okay.  Still mildly tachycardic.  Objective: Vital signs in last 24 hours: Temp:  [97.6 F (36.4 C)-99.7 F (37.6 C)] 97.6 F (36.4 C) 05/27/22 0700) Pulse Rate:  [94-130] 94  05/27/2022 0700) Resp:  [20-25] 21  May 27, 2022 0847) BP: (103-151)/(68-96) 103/80 mmHg 05/27/22 0700) SpO2:  [95 %-100 %] 98 % 2022-05-27 0922) Last BM Date: 05/27/11  Intake/Output from previous day: 04/19 0701 - May 27, 2022 0700 In: 1716.3 [I.V.:1716.3] Out: 950 [Urine:950] Intake/Output this shift: Total I/O In: 75 [I.V.:75] Out: -   Lungs: Clear to auscultation  Abd: Soft, bowel sounds present.  BM x 2 in the last 24 hours  Extremities: No DVT signs or symptoms  Neuro: Intact  Lab Results: CBC   Basename May 27, 2011 0518 05/08/11 0625  WBC 10.9* 10.4  HGB 10.3* 11.6*  HCT 33.1* 36.7  PLT 224 250   BMET  Basename 05-27-2011 0518 05/08/11 0625  NA 140 133*  K 3.7 3.8  CL 112 104  CO2 21 19  GLUCOSE 163* 215*  BUN 9 6  CREATININE 0.84 0.71  CALCIUM 8.3* 8.8   PT/INR No results found for this basename: LABPROT:2,INR:2 in the last 72 hours ABG No results found for this basename: PHART:2,PCO2:2,PO2:2,HCO3:2 in the last 72 hours  Studies/Results: Dg Abd 2 Views  05-27-2011  *RADIOLOGY REPORT*  Clinical Data: Umbilical hernia repair (04/16) evaluate ileus  ABDOMEN - 2 VIEW  Comparison: 05/08/2011  Findings:   Redemonstrated mild to moderate gaseous distension of predominately the small bowel with index loop within the left lower abdominal quadrant measuring 4.3 cm in diameter.  A small amount of air is seen within the cecum and transverse colon but otherwise there is a paucity of distal colonic gas.  Multiple air fluid levels are seen on the upright radiograph.  No pneumoperitoneum, pneumatosis or portal venous gas.  Limited visualization of the lower thorax redemonstrates left basilar opacities, possibly atelectasis.  Grossly unchanged bones.  IMPRESSION:  Overall  findings may suggest a postoperative ileus, developing partial small bowel obstruction is not excluded.  Original Report Authenticated By: Rachel Moulds, M.D.   Dg Abd Acute W/chest  05/08/2011  *RADIOLOGY REPORT*  Clinical Data: Shortness of breath.  Weakness.  Abdominal pain.  ACUTE ABDOMEN SERIES (ABDOMEN 2 VIEW & CHEST 1 VIEW)  Comparison: Two-view chest 04/24/2011.  Findings: The lung volumes are low.  Right middle or lower lobe airspace disease is new.  Mild left basilar atelectasis is new as well.  Mild pulmonary vascular congestion is evident.  Dilated loops of both large and small bowel are evident.  There are fluid levels in small bowel.  No free air is present.  IMPRESSION:  1.  The right middle or lower lobe airspace disease likely reflects atelectasis. 2.  Low lung volumes and mild left basilar atelectasis. 3.  Mild pulmonary vascular congestion. 4.  Dilated loops of small bowel with fluid levels.  This suggests a partial small bowel obstruction as there is also gaseous distention of large bowel.  The overall picture may be that of an ileus.  Original Report Authenticated By: Resa Miner. MATTERN, M.D.    Anti-infectives: Anti-infectives     Start     Dose/Rate Route Frequency Ordered Stop   05/05/11 1400   ceFAZolin (ANCEF) IVPB 1 g/50 mL premix  Status:  Discontinued        1 g 100 mL/hr over 30 Minutes Intravenous 3 times per day 05/05/11 1130 05/06/11  0807   05/05/11 0539   ceFAZolin (ANCEF) IVPB 2 g/50 mL premix  Status:  Discontinued        2 g 100 mL/hr over 30 Minutes Intravenous 60 min pre-op 05/05/11 7673 05/05/11 1112   05/04/11 1439   ceFAZolin (ANCEF) IVPB 2 g/50 mL premix  Status:  Discontinued        2 g 100 mL/hr over 30 Minutes Intravenous 60 min pre-op 05/04/11 1439 05/05/11 4193          Assessment/Plan: s/p Procedure(s): LAPAROSCOPIC INCISIONAL HERNIA EXCISION MASS INSERTION OF MESH Advance diet Transfer to floor.  DC Morphine PCA  LOS: 4 days     Kathryne Eriksson. Dahlia Bailiff, MD, FACS [phone] [facility] 05/09/2011

## 2011-05-10 ENCOUNTER — Encounter (HOSPITAL_COMMUNITY): Payer: Self-pay | Admitting: Surgery

## 2011-05-10 DIAGNOSIS — K432 Incisional hernia without obstruction or gangrene: Secondary | ICD-10-CM | POA: Diagnosis present

## 2011-05-10 LAB — DIFFERENTIAL
Basophils Absolute: 0 10*3/uL (ref 0.0–0.1)
Basophils Relative: 0 % (ref 0–1)
Eosinophils Absolute: 0.1 10*3/uL (ref 0.0–0.7)
Eosinophils Relative: 1 % (ref 0–5)
Lymphocytes Relative: 13 % (ref 12–46)
Lymphs Abs: 1.3 10*3/uL (ref 0.7–4.0)
Monocytes Absolute: 0.8 10*3/uL (ref 0.1–1.0)
Monocytes Relative: 8 % (ref 3–12)
Neutro Abs: 8 10*3/uL — ABNORMAL HIGH (ref 1.7–7.7)
Neutrophils Relative %: 78 % — ABNORMAL HIGH (ref 43–77)

## 2011-05-10 LAB — CBC
HCT: 32.7 % — ABNORMAL LOW (ref 36.0–46.0)
Hemoglobin: 10.3 g/dL — ABNORMAL LOW (ref 12.0–15.0)
MCH: 28.9 pg (ref 26.0–34.0)
MCHC: 31.5 g/dL (ref 30.0–36.0)
MCV: 91.9 fL (ref 78.0–100.0)
Platelets: 231 10*3/uL (ref 150–400)
RBC: 3.56 MIL/uL — ABNORMAL LOW (ref 3.87–5.11)
RDW: 14.6 % (ref 11.5–15.5)
WBC: 10.3 10*3/uL (ref 4.0–10.5)

## 2011-05-10 LAB — GLUCOSE, CAPILLARY
Glucose-Capillary: 140 mg/dL — ABNORMAL HIGH (ref 70–99)
Glucose-Capillary: 159 mg/dL — ABNORMAL HIGH (ref 70–99)
Glucose-Capillary: 111 mg/dL — ABNORMAL HIGH (ref 70–99)
Glucose-Capillary: 159 mg/dL — ABNORMAL HIGH (ref 70–99)

## 2011-05-10 MED ORDER — GLIPIZIDE ER 10 MG PO TB24
20.0000 mg | ORAL_TABLET | Freq: Every day | ORAL | Status: DC
  Administered 2011-05-10: 20 mg via ORAL
  Filled 2011-05-10 (×4): qty 2

## 2011-05-10 MED ORDER — ENOXAPARIN SODIUM 60 MG/0.6ML ~~LOC~~ SOLN
60.0000 mg | SUBCUTANEOUS | Status: DC
  Administered 2011-05-10 – 2011-05-17 (×8): 60 mg via SUBCUTANEOUS
  Filled 2011-05-10 (×9): qty 0.6

## 2011-05-10 MED ORDER — SODIUM CHLORIDE 0.9 % IJ SOLN
3.0000 mL | Freq: Two times a day (BID) | INTRAMUSCULAR | Status: DC
  Administered 2011-05-10 – 2011-05-15 (×5): 3 mL via INTRAVENOUS

## 2011-05-10 MED ORDER — FAMOTIDINE 40 MG PO TABS: 40.0000 mg | ORAL_TABLET | Freq: Every day | ORAL | Status: DC

## 2011-05-10 MED ORDER — BACLOFEN 10 MG PO TABS
10.0000 mg | ORAL_TABLET | Freq: Three times a day (TID) | ORAL | Status: DC | PRN
  Administered 2011-05-10: 10 mg via ORAL
  Filled 2011-05-10: qty 1

## 2011-05-10 MED ORDER — FAMOTIDINE 20 MG PO TABS
20.0000 mg | ORAL_TABLET | Freq: Two times a day (BID) | ORAL | Status: DC
  Administered 2011-05-10 – 2011-05-11 (×3): 20 mg via ORAL
  Filled 2011-05-10 (×8): qty 1

## 2011-05-10 MED ORDER — FAMOTIDINE 20 MG PO TABS: 20.0000 mg | ORAL_TABLET | Freq: Two times a day (BID) | ORAL | Status: DC

## 2011-05-10 MED ORDER — ACETAMINOPHEN 325 MG PO TABS
650.0000 mg | ORAL_TABLET | Freq: Four times a day (QID) | ORAL | Status: DC
  Administered 2011-05-10 – 2011-05-17 (×23): 650 mg via ORAL
  Filled 2011-05-10 (×25): qty 2

## 2011-05-10 MED ORDER — SODIUM CHLORIDE 0.9 % IJ SOLN: 3.0000 mL | INTRAMUSCULAR | Status: DC | PRN

## 2011-05-10 MED ORDER — INSULIN ASPART 100 UNIT/ML ~~LOC~~ SOLN: 2.0000 [IU] | Freq: Four times a day (QID) | SUBCUTANEOUS | Status: DC

## 2011-05-10 MED ORDER — INSULIN DETEMIR 100 UNIT/ML ~~LOC~~ SOLN
15.0000 [IU] | Freq: Two times a day (BID) | SUBCUTANEOUS | Status: DC
  Administered 2011-05-10 – 2011-05-17 (×14): 15 [IU] via SUBCUTANEOUS
  Filled 2011-05-10: qty 10

## 2011-05-10 MED ORDER — LACTATED RINGERS IV BOLUS (SEPSIS): 1000.0000 mL | Freq: Three times a day (TID) | INTRAVENOUS | Status: DC | PRN

## 2011-05-10 MED ORDER — INSULIN DETEMIR 100 UNIT/ML ~~LOC~~ SOLN: 30.0000 [IU] | Freq: Two times a day (BID) | SUBCUTANEOUS | Status: DC

## 2011-05-10 MED ORDER — DIPHENHYDRAMINE HCL 50 MG/ML IJ SOLN
12.5000 mg | Freq: Four times a day (QID) | INTRAMUSCULAR | Status: DC | PRN
Start: 2011-05-10 — End: 2011-05-18

## 2011-05-10 MED ORDER — TAB-A-VITE/IRON PO TABS
1.0000 | ORAL_TABLET | Freq: Every day | ORAL | Status: DC
  Administered 2011-05-10: 1 via ORAL
  Filled 2011-05-10 (×2): qty 1

## 2011-05-10 MED ORDER — PANTOPRAZOLE SODIUM 40 MG PO TBEC: 40.0000 mg | DELAYED_RELEASE_TABLET | Freq: Two times a day (BID) | ORAL | Status: DC

## 2011-05-10 MED ORDER — PSYLLIUM 95 % PO PACK
1.0000 | PACK | Freq: Every day | ORAL | Status: DC
  Administered 2011-05-10: 1 via ORAL
  Filled 2011-05-10 (×2): qty 1

## 2011-05-10 MED ORDER — SIMVASTATIN 20 MG PO TABS
20.0000 mg | ORAL_TABLET | Freq: Every evening | ORAL | Status: DC
  Administered 2011-05-10: 20 mg via ORAL
  Filled 2011-05-10 (×4): qty 1

## 2011-05-10 NOTE — Progress Notes (Signed)
[name] [id number] 1965/02/03  CARE TEAM:  PCP: Philis Fendt, MD, MD  Outpatient Care Team: Patient Care Team: Philis Fendt, MD as PCP - General (Internal Medicine)  Inpatient Treatment Team: Treatment Team: Attending Provider: Rolm Bookbinder, MD; Technician: Christella Hartigan, NT; Registered Nurse: Candida Peeling, RN; Registered Nurse: Gale Journey, RN; Technician: Blair Dolphin, NT; Technician: Boyd Kerbs, NT; Technician: Ander Slade, [place]; Registered Nurse: Irish Elders, RN; Registered Nurse: Mila Palmer, RN; Registered Nurse: Colin Broach, RN; Technician: Kara Dies, NT  Subjective:  Transferred to floor HR more WNL Starting PO - wants to adv more Walking a little C/o GERD Using PO tramadol - somewhat helping RN in room  Objective:  Vital signs:  Filed Vitals:   05/09/11 1943 05/09/11 2155 05/10/11 0556 05/10/11 0805  BP:  131/76 120/85   Pulse:  97 95   Temp:  98.1 F (36.7 C) 98.6 F (37 C)   TempSrc:  Oral Oral   Resp:  22 20   Height:      Weight:      SpO2: 97% 100% 98% 98%    Last BM Date: 05/09/11  Intake/Output   Yesterday:  04/20 0701 - 04/21 0700 In: 527 [P.O.:600; I.V.:75] Out: 1400 [Urine:1400] This shift:  Total I/O In: -  Out: 400 [Urine:400]  Bowel function:  Flatus: Yes  BM: Yes x2  Physical Exam:  General: Pt awake/alert/oriented x4 in no acute distress Eyes: PERRL, normal EOM.  Sclera clear.  No icterus Neuro: CN II-XII intact w/o focal sensory/motor deficits. Lymph: No head/neck/groin lymphadenopathy Psych:  No delerium/psychosis/paranoia.  Quiet/Not very forthcoming... HENT: Normocephalic, Mucus membranes moist.  No thrush Neck: Supple, No tracheal deviation Chest: No chest wall pain w good excursion CV:  Pulses intact.  Regular rhythm Abdomen: Soft.  Obese.  Nondistended.  Mildly tender at incisions only.  No incarcerated hernias. Ext:  SCDs BLE.   No mjr edema.  No cyanosis Skin: No petechiae / purpurae  Results:   Labs: Results for orders placed during the hospital encounter of 05/05/11 (from the past 48 hour(s))  GLUCOSE, CAPILLARY     Status: Abnormal   Collection Time   05/08/11 12:13 PM      Component Value Range Comment   Glucose-Capillary 190 (*) 70 - 99 (mg/dL)    Comment 1 Notify RN     GLUCOSE, CAPILLARY     Status: Abnormal   Collection Time   05/08/11  5:12 PM      Component Value Range Comment   Glucose-Capillary 171 (*) 70 - 99 (mg/dL)    Comment 1 Notify RN     MRSA PCR SCREENING     Status: Normal   Collection Time   05/08/11  6:31 PM      Component Value Range Comment   MRSA by PCR NEGATIVE  NEGATIVE    BASIC METABOLIC PANEL     Status: Abnormal   Collection Time   05/09/11  5:18 AM      Component Value Range Comment   Sodium 140  135 - 145 (mEq/L) DELTA CHECK NOTED   Potassium 3.7  3.5 - 5.1 (mEq/L)    Chloride 112  96 - 112 (mEq/L)    CO2 21  19 - 32 (mEq/L)    Glucose, Bld 163 (*) 70 - 99 (mg/dL)    BUN 9  6 - 23 (mg/dL)    Creatinine, Ser 0.84  0.50 -  1.10 (mg/dL)    Calcium 8.3 (*) 8.4 - 10.5 (mg/dL)    GFR calc non Af Amer 82 (*) >90 (mL/min)    GFR calc Af Amer >90  >90 (mL/min)   CBC     Status: Abnormal   Collection Time   05/09/11  5:18 AM      Component Value Range Comment   WBC 10.9 (*) 4.0 - 10.5 (K/uL)    RBC 3.61 (*) 3.87 - 5.11 (MIL/uL)    Hemoglobin 10.3 (*) 12.0 - 15.0 (g/dL)    HCT 33.1 (*) 36.0 - 46.0 (%)    MCV 91.7  78.0 - 100.0 (fL)    MCH 28.5  26.0 - 34.0 (pg)    MCHC 31.1  30.0 - 36.0 (g/dL)    RDW 14.7  11.5 - 15.5 (%)    Platelets 224  150 - 400 (K/uL)   GLUCOSE, CAPILLARY     Status: Abnormal   Collection Time   05/09/11  8:06 AM      Component Value Range Comment   Glucose-Capillary 137 (*) 70 - 99 (mg/dL)    Comment 1 Notify RN     GLUCOSE, CAPILLARY     Status: Abnormal   Collection Time   05/09/11 11:49 AM      Component Value Range Comment    Glucose-Capillary 167 (*) 70 - 99 (mg/dL)    Comment 1 Notify RN      Comment 2 Documented in Chart     GLUCOSE, CAPILLARY     Status: Abnormal   Collection Time   05/09/11  4:59 PM      Component Value Range Comment   Glucose-Capillary 148 (*) 70 - 99 (mg/dL)    Comment 1 Documented in Chart      Comment 2 Notify RN     GLUCOSE, CAPILLARY     Status: Abnormal   Collection Time   05/09/11  9:54 PM      Component Value Range Comment   Glucose-Capillary 168 (*) 70 - 99 (mg/dL)   CBC     Status: Abnormal   Collection Time   05/10/11  5:35 AM      Component Value Range Comment   WBC 10.3  4.0 - 10.5 (K/uL)    RBC 3.56 (*) 3.87 - 5.11 (MIL/uL)    Hemoglobin 10.3 (*) 12.0 - 15.0 (g/dL)    HCT 32.7 (*) 36.0 - 46.0 (%)    MCV 91.9  78.0 - 100.0 (fL)    MCH 28.9  26.0 - 34.0 (pg)    MCHC 31.5  30.0 - 36.0 (g/dL)    RDW 14.6  11.5 - 15.5 (%)    Platelets 231  150 - 400 (K/uL)   DIFFERENTIAL     Status: Abnormal   Collection Time   05/10/11  5:35 AM      Component Value Range Comment   Neutrophils Relative 78 (*) 43 - 77 (%)    Neutro Abs 8.0 (*) 1.7 - 7.7 (K/uL)    Lymphocytes Relative 13  12 - 46 (%)    Lymphs Abs 1.3  0.7 - 4.0 (K/uL)    Monocytes Relative 8  3 - 12 (%)    Monocytes Absolute 0.8  0.1 - 1.0 (K/uL)    Eosinophils Relative 1  0 - 5 (%)    Eosinophils Absolute 0.1  0.0 - 0.7 (K/uL)    Basophils Relative 0  0 - 1 (%)    Basophils Absolute 0.0  0.0 - 0.1 (K/uL)     Imaging / Studies: Dg Abd 2 Views  05/09/2011  *RADIOLOGY REPORT*  Clinical Data: Umbilical hernia repair (04/16) evaluate ileus  ABDOMEN - 2 VIEW  Comparison: 05/08/2011  Findings:   Redemonstrated mild to moderate gaseous distension of predominately the small bowel with index loop within the left lower abdominal quadrant measuring 4.3 cm in diameter.  A small amount of air is seen within the cecum and transverse colon but otherwise there is a paucity of distal colonic gas.  Multiple air fluid levels are seen  on the upright radiograph.  No pneumoperitoneum, pneumatosis or portal venous gas.  Limited visualization of the lower thorax redemonstrates left basilar opacities, possibly atelectasis.  Grossly unchanged bones.  IMPRESSION:  Overall findings may suggest a postoperative ileus, developing partial small bowel obstruction is not excluded.  Original Report Authenticated By: Rachel Moulds, M.D.   Dg Abd Acute W/chest  05/08/2011  *RADIOLOGY REPORT*  Clinical Data: Shortness of breath.  Weakness.  Abdominal pain.  ACUTE ABDOMEN SERIES (ABDOMEN 2 VIEW & CHEST 1 VIEW)  Comparison: Two-view chest 04/24/2011.  Findings: The lung volumes are low.  Right middle or lower lobe airspace disease is new.  Mild left basilar atelectasis is new as well.  Mild pulmonary vascular congestion is evident.  Dilated loops of both large and small bowel are evident.  There are fluid levels in small bowel.  No free air is present.  IMPRESSION:  1.  The right middle or lower lobe airspace disease likely reflects atelectasis. 2.  Low lung volumes and mild left basilar atelectasis. 3.  Mild pulmonary vascular congestion. 4.  Dilated loops of small bowel with fluid levels.  This suggests a partial small bowel obstruction as there is also gaseous distention of large bowel.  The overall picture may be that of an ileus.  Original Report Authenticated By: Resa Miner. MATTERN, M.D.    Medications / Allergies: per chart  Antibiotics: Anti-infectives     Start     Dose/Rate Route Frequency Ordered Stop   05/05/11 1400   ceFAZolin (ANCEF) IVPB 1 g/50 mL premix  Status:  Discontinued        1 g 100 mL/hr over 30 Minutes Intravenous 3 times per day 05/05/11 1130 05/06/11 0807   05/05/11 8182   ceFAZolin (ANCEF) IVPB 2 g/50 mL premix  Status:  Discontinued        2 g 100 mL/hr over 30 Minutes Intravenous 60 min pre-op 05/05/11 9937 05/05/11 1112   05/04/11 1439   ceFAZolin (ANCEF) IVPB 2 g/50 mL premix  Status:  Discontinued        2  g 100 mL/hr over 30 Minutes Intravenous 60 min pre-op 05/04/11 1439 05/05/11 1696          Problem List:  Principal Problem:  *Incisional hernia Active Problems:  DIABETES MELLITUS, TYPE II  HYPERLIPIDEMIA  DEPRESSION  HYPERTENSION  GERD  SEIZURE DISORDER  Tachycardia  Obesity, Class III, BMI 40-49.9 (morbid obesity)   Assessment  [name]  46 y.o. female  5 Days Post-Op  Procedure(s): LAPAROSCOPIC INCISIONAL HERNIA repair with INSERTION OF MESH EXCISION MASS   Recovering gradually  Plan: -wean IVF -add PO tylenol.  Continue chronic pain meds -control GERD - home regimen -DM control - resume home regimen -HTN - controlled -VTE prophylaxis- SCDs, etc -mobilize as tolerated to help recovery -poss D/C in 1-2 days if continues to improve  Adin Hector, M.D., F.A.C.S.  Gastrointestinal and Minimally Invasive [facility] [address] [place],  [id number] [phone] Main / Paging [phone] Voice Mail   05/10/2011

## 2011-05-11 ENCOUNTER — Inpatient Hospital Stay (HOSPITAL_COMMUNITY): Payer: PRIVATE HEALTH INSURANCE

## 2011-05-11 LAB — BASIC METABOLIC PANEL
BUN: 9 mg/dL (ref 6–23)
CO2: 23 mEq/L (ref 19–32)
Calcium: 10 mg/dL (ref 8.4–10.5)
Chloride: 102 mEq/L (ref 96–112)
Creatinine, Ser: 0.79 mg/dL (ref 0.50–1.10)
GFR calc Af Amer: >90 mL/min (ref >90)
GFR calc non Af Amer: >90 mL/min (ref >90)
Glucose, Bld: 179 mg/dL — ABNORMAL HIGH (ref 70–99)
Potassium: 3.4 mEq/L — ABNORMAL LOW (ref 3.5–5.1)
Sodium: 139 mEq/L (ref 135–145)

## 2011-05-11 LAB — GLUCOSE, CAPILLARY
Glucose-Capillary: 145 mg/dL — ABNORMAL HIGH (ref 70–99)
Glucose-Capillary: 172 mg/dL — ABNORMAL HIGH (ref 70–99)
Glucose-Capillary: 130 mg/dL — ABNORMAL HIGH (ref 70–99)
Glucose-Capillary: 134 mg/dL — ABNORMAL HIGH (ref 70–99)

## 2011-05-11 MED ORDER — ONDANSETRON HCL 4 MG/2ML IJ SOLN
4.0000 mg | INTRAMUSCULAR | Status: DC | PRN
  Administered 2011-05-11 – 2011-05-17 (×24): 4 mg via INTRAVENOUS
  Filled 2011-05-11 (×27): qty 2

## 2011-05-11 MED ORDER — BISACODYL 10 MG RE SUPP
10.0000 mg | Freq: Once | RECTAL | Status: AC
  Administered 2011-05-11: 10 mg via RECTAL
  Filled 2011-05-11: qty 1

## 2011-05-11 MED ORDER — POTASSIUM CHLORIDE 10 MEQ/100ML IV SOLN
10.0000 meq | INTRAVENOUS | Status: AC
  Administered 2011-05-11 (×4): 10 meq via INTRAVENOUS
  Filled 2011-05-11 (×7): qty 100

## 2011-05-11 MED ORDER — SODIUM CHLORIDE 0.9 % IV SOLN
INTRAVENOUS | Status: DC
  Administered 2011-05-11 – 2011-05-13 (×2): via INTRAVENOUS

## 2011-05-11 NOTE — Progress Notes (Signed)
Physical Therapy Treatment Patient Details Name: [name] MRN: [id number] DOB: 02-Mar-1965 Today's Date: 05/11/2011 Time: 0277-4128 PT Time Calculation (min): 17 min  PT Assessment / Plan / Recommendation Comments on Treatment Session  Doing very well today. Will try quad cane next visit.     Follow Up Recommendations  Home health PT;Supervision for mobility/OOB    Equipment Recommendations   (TBD (will try quad cane next visit))    Frequency   3x/wk  Plan Discharge plan remains appropriate;Frequency remains appropriate    Precautions / Restrictions         Mobility  Bed Mobility Bed Mobility: Sit to Supine Sit to Supine: 6: Modified independent (Device/Increase time) Transfers Transfers: Sit to Stand;Stand to Sit Sit to Stand: 5: Supervision;From bed;With upper extremity assist Stand to Sit: 5: Supervision;To bed;With upper extremity assist Details for Transfer Assistance: cues for safe hand placement Ambulation/Gait Ambulation/Gait Assistance: 5: Supervision Ambulation Distance (Feet): 200 Feet Assistive device: Rolling walker Ambulation/Gait Assistance Details: cues for safe technique with RW especially during turns; good speed Gait Pattern: Step-through pattern;Wide base of support    Exercises     PT Goals Acute Rehab PT Goals PT Goal: Sit to Supine/Side - Progress: Met PT Goal: Sit to Stand - Progress: Progressing toward goal PT Goal: Stand to Sit - Progress: Progressing toward goal PT Transfer Goal: Bed to Chair/Chair to Bed - Progress: Progressing toward goal PT Goal: Ambulate - Progress: Progressing toward goal  Visit Information  Last PT Received On: 05/11/11 Assistance Needed: +1    Subjective Data  Subjective: Ive been up in that chair since 9 pm throwing up.    Cognition  Overall Cognitive Status: Appears within functional limits for tasks assessed/performed Arousal/Alertness: Awake/alert Orientation Level: Appears intact for tasks  assessed Behavior During Session: [facility] for tasks performed    Balance     End of Session PT - End of Session Equipment Utilized During Treatment: Gait belt Activity Tolerance: Patient tolerated treatment well Patient left: in bed;with call bell/phone within reach;with family/visitor present Nurse Communication: Mobility status    WHITLOW,Lijah Bourque HELEN 05/11/2011, 3:06 PM

## 2011-05-11 NOTE — Progress Notes (Addendum)
6 Days Post-Op  Subjective: Had tolerated clears, passing flatus, having bm then emesis overnight with food  Objective: Vital signs in last 24 hours: Temp:  [98.2 F (36.8 C)-98.7 F (37.1 C)] 98.6 F (37 C) (04/22 0513) Pulse Rate:  [89-92] 90  (04/22 0513) Resp:  [18-20] 18  (04/22 0513) BP: (120-141)/(83-89) 138/89 mmHg (04/22 0513) SpO2:  [98 %-100 %] 100 % (04/22 0513) Last BM Date: 05/09/11  Intake/Output from previous day: 04/21 0701 - 04/22 0700 In: 960 [P.O.:960] Out: 2400 [Urine:1200; Emesis/NG output:1200] Intake/Output this shift:    General appearance: no distress Resp: clear to auscultation bilaterally Cardio: regular rate and rhythm GI: wounds clean, obese, soft, appropr tender, she has bs present   Assessment/Plan: POD #6 lap vh 1. Cont current pain control 2. pulm toilet 3. cv much better overall 4. She was moving her bowels then had emesis overnight, this could be multifactorial in her, will check films, leave at clears for now 5. Lovenox, scds   LOS: 6 days    [facility] 05/11/2011  Her films show what I think is ileus, she is having flatus, I will leave at clears, back on iv fluids, recheck labs and films in am

## 2011-05-11 NOTE — Progress Notes (Signed)
Vomiting a total of 3 times,1200cc.MD aware ords. carried out

## 2011-05-12 ENCOUNTER — Inpatient Hospital Stay (HOSPITAL_COMMUNITY): Payer: PRIVATE HEALTH INSURANCE

## 2011-05-12 LAB — BASIC METABOLIC PANEL
BUN: 12 mg/dL (ref 6–23)
CO2: 22 mEq/L (ref 19–32)
Calcium: 8.8 mg/dL (ref 8.4–10.5)
Chloride: 107 mEq/L (ref 96–112)
Creatinine, Ser: 0.78 mg/dL (ref 0.50–1.10)
GFR calc Af Amer: >90 mL/min (ref >90)
GFR calc non Af Amer: >90 mL/min (ref >90)
Glucose, Bld: 147 mg/dL — ABNORMAL HIGH (ref 70–99)
Potassium: 3.2 mEq/L — ABNORMAL LOW (ref 3.5–5.1)
Sodium: 141 mEq/L (ref 135–145)

## 2011-05-12 LAB — GLUCOSE, CAPILLARY
Glucose-Capillary: 141 mg/dL — ABNORMAL HIGH (ref 70–99)
Glucose-Capillary: 142 mg/dL — ABNORMAL HIGH (ref 70–99)
Glucose-Capillary: 152 mg/dL — ABNORMAL HIGH (ref 70–99)
Glucose-Capillary: 132 mg/dL — ABNORMAL HIGH (ref 70–99)

## 2011-05-12 MED ORDER — POTASSIUM CHLORIDE 10 MEQ/100ML IV SOLN
INTRAVENOUS | Status: AC
  Filled 2011-05-12: qty 100

## 2011-05-12 MED ORDER — IOHEXOL 300 MG/ML  SOLN
100.0000 mL | Freq: Once | INTRAMUSCULAR | Status: AC | PRN
  Administered 2011-05-12: 100 mL via INTRAVENOUS

## 2011-05-12 MED ORDER — OXYCODONE-ACETAMINOPHEN 5-325 MG PO TABS
1.0000 | ORAL_TABLET | ORAL | Status: DC | PRN
  Filled 2011-05-12: qty 2

## 2011-05-12 MED ORDER — POTASSIUM CHLORIDE 10 MEQ/100ML IV SOLN
10.0000 meq | INTRAVENOUS | Status: AC
  Administered 2011-05-12 (×4): 10 meq via INTRAVENOUS
  Filled 2011-05-12 (×3): qty 100

## 2011-05-12 MED ORDER — ENSURE PUDDING PO PUDG
1.0000 | Freq: Three times a day (TID) | ORAL | Status: DC
  Administered 2011-05-12: 1 via ORAL

## 2011-05-12 NOTE — Progress Notes (Signed)
Attempted to get pt OOB to ambulate. As soon as she stood by the side of the bed she proceeded to vomit. She forcefully vomited approximately 1500 cc green liquid emesis. Pt's abd slightly more distended and firm. Awaiting CT dept to bring contrast and will encourage pt to drink as much as she can tolerate.

## 2011-05-12 NOTE — Progress Notes (Cosign Needed)
Administered 571m Soap sud enema per order. Patient tolerated it without complaint. Had large amount of liquid fluid in return, dark green in color.  Reported info to [employer]

## 2011-05-12 NOTE — Progress Notes (Signed)
7 Days Post-Op  Subjective: Passing flatus, having bms, no emesis yesterday but still feels bloated  Objective: Vital signs in last 24 hours: Temp:  [97.7 F (36.5 C)-98.1 F (36.7 C)] 98.1 F (36.7 C) 2022-06-09 0625) Pulse Rate:  [84-91] 90  2022/06/09 0625) Resp:  [16-18] 18  Jun 09, 2022 0625) BP: (114-136)/(69-84) 114/69 mmHg 09-Jun-2022 0625) SpO2:  [97 %-100 %] 100 % Jun 09, 2022 0625) Last BM Date: 05/11/11  Intake/Output from previous day: 04/22 0701 - Jun 09, 2022 0700 In: 1107 [I.V.:1107] Out: 200 [Urine:200] Intake/Output this shift: Total I/O In: 360 [P.O.:360] Out: -   General appearance: no distress Resp: clear to auscultation bilaterally Cardio: regular rate and rhythm GI: minimally distended, obese, appropr tender, wounds clean  Lab Results:   [facility] 05/10/11 0535  WBC 10.3  HGB 10.3*  HCT 32.7*  PLT 231   BMET  Basename 06-09-11 0841 05/11/11 0820  NA 141 139  K 3.2* 3.4*  CL 107 102  CO2 22 23  GLUCOSE 147* 179*  BUN 12 9  CREATININE 0.78 0.79  CALCIUM 8.8 10.0   PT/INR No results found for this basename: LABPROT:2,INR:2 in the last 72 hours ABG No results found for this basename: PHART:2,PCO2:2,PO2:2,HCO3:2 in the last 72 hours  Studies/Results: Dg Abd 2 Views  06/09/2011  *RADIOLOGY REPORT*  Clinical Data: Follow up ileus, abdominal pain, nausea/vomiting, recent hernia surgery  ABDOMEN - 2 VIEW  Comparison: 05/11/2011  Findings: Again seen is a mildly prominent loop of small bowel in the left mid abdomen, along with gas filled loops of colon.  This appearance favors adynamic small bowel ileus, although partial small bowel obstruction remains possible.  No evidence of free air under the diaphragm on the upright view.  Visualized osseous structures are within normal limits.  IMPRESSION: Mildly prominent loop of small bowel in the left mid abdomen, favoring adynamic small bowel ileus, although partial small bowel obstruction remains possible.  Original Report  Authenticated By: Julian Hy, M.D.   Dg Abd 2 Views  05/11/2011  *RADIOLOGY REPORT*  Clinical Data: Recent hernia repair, now with nausea and vomiting  ABDOMEN - 2 VIEW  Comparison: 05/01/2011; 05/08/2011  Findings:  Minimal increase in gaseous distension of a loop of small bowel within the left mid hemiabdomen measuring approximately 5.5 cm in diameter. Distal colonic gas is seen within the hepatic flexure of the transverse colon.  No definite pneumoperitoneum, pneumatosis or portal venous gas.  An enteric tube is not identified.  Limited visualization lower thorax demonstrates mild elevation of the right hemidiaphragm. No acute osseous abnormalities.  IMPRESSION: Increased gaseous distension of a loop of small bowel within the left mid hemiabdomen with distal colonic gas, findings suggestive of worsening ileus though partial small bowel obstruction may have a similar appearance.  Original Report Authenticated By: Rachel Moulds, M.D.     Assessment/Plan:  POD #7 lap vh with ileus 1. Po pain meds 2. pulm toilet 3. cbgs under good control 4. Ileus resolving,will give fulls today   LOS: 7 days    [place] 06/09/11

## 2011-05-13 ENCOUNTER — Inpatient Hospital Stay (HOSPITAL_COMMUNITY): Payer: PRIVATE HEALTH INSURANCE

## 2011-05-13 LAB — BASIC METABOLIC PANEL
BUN: 11 mg/dL (ref 6–23)
CO2: 22 mEq/L (ref 19–32)
Calcium: 8.9 mg/dL (ref 8.4–10.5)
Chloride: 106 mEq/L (ref 96–112)
Creatinine, Ser: 0.72 mg/dL (ref 0.50–1.10)
GFR calc Af Amer: >90 mL/min (ref >90)
GFR calc non Af Amer: >90 mL/min (ref >90)
Glucose, Bld: 152 mg/dL — ABNORMAL HIGH (ref 70–99)
Potassium: 3.1 mEq/L — ABNORMAL LOW (ref 3.5–5.1)
Sodium: 141 mEq/L (ref 135–145)

## 2011-05-13 LAB — GLUCOSE, CAPILLARY
Glucose-Capillary: 142 mg/dL — ABNORMAL HIGH (ref 70–99)
Glucose-Capillary: 135 mg/dL — ABNORMAL HIGH (ref 70–99)
Glucose-Capillary: 147 mg/dL — ABNORMAL HIGH (ref 70–99)
Glucose-Capillary: 161 mg/dL — ABNORMAL HIGH (ref 70–99)

## 2011-05-13 MED ORDER — KCL IN DEXTROSE-NACL 20-5-0.45 MEQ/L-%-% IV SOLN
INTRAVENOUS | Status: DC
  Administered 2011-05-13 – 2011-05-15 (×5): via INTRAVENOUS
  Administered 2011-05-15: 50 mL/h via INTRAVENOUS
  Administered 2011-05-16 – 2011-05-17 (×2): via INTRAVENOUS
  Filled 2011-05-13 (×10): qty 1000

## 2011-05-13 MED ORDER — POTASSIUM CHLORIDE 10 MEQ/100ML IV SOLN
10.0000 meq | INTRAVENOUS | Status: AC
  Administered 2011-05-13 (×2): 10 meq via INTRAVENOUS
  Filled 2011-05-13 (×2): qty 100

## 2011-05-13 MED ORDER — PANTOPRAZOLE SODIUM 40 MG IV SOLR
40.0000 mg | Freq: Every day | INTRAVENOUS | Status: DC
  Administered 2011-05-13 – 2011-05-17 (×4): 40 mg via INTRAVENOUS
  Filled 2011-05-13 (×6): qty 40

## 2011-05-13 NOTE — Progress Notes (Signed)
Physical Therapy Treatment Patient Details Name: [name] MRN: [id number] DOB: [birth year] Today's Date: 05/13/2011 Time: 2820-6015 PT Time Calculation (min): 19 min  PT Assessment / Plan / Recommendation Comments on Treatment Session  Ambulates well with quad cane, will recommend this for home.     Follow Up Recommendations  Home health PT;Supervision for mobility/OOB    Equipment Recommendations  Small-based quad cane    Frequency     Plan Discharge plan remains appropriate;Frequency remains appropriate    Precautions / Restrictions   fall      Mobility  Transfers Sit to Stand: 6: Modified independent (Device/Increase time) Stand to Sit: 6: Modified independent (Device/Increase time) Ambulation/Gait Ambulation/Gait Assistance: 4: Min guard;5: Supervision Ambulation Distance (Feet): 250 Feet Assistive device: Small based quad cane Ambulation/Gait Assistance Details: initially pt needing more sequencing cues with quad cane especially in tighter spaces; after about 100 ft pt with better/safer use of quad cane and needing only supervision; fatigues after approx 200 ft  needing 2-3 more standing rest breaks Gait Pattern: Step-to pattern;Trunk flexed;Wide base of support Stairs:  (pt declined to practice steps today)    Exercises General Exercises - Lower Extremity Long Arc Quad: AROM;Both;5 reps;Seated (pt reports she is too tired to do more)   PT Goals Acute Rehab PT Goals PT Goal: Sit to Stand - Progress: Met PT Goal: Stand to Sit - Progress: Met PT Transfer Goal: Bed to Chair/Chair to Bed - Progress: Progressing toward goal PT Goal: Ambulate - Progress: Progressing toward goal  Visit Information  Last PT Received On: 05/13/11 Assistance Needed: +1    Subjective Data  Subjective: Im doing much better   Cognition  Overall Cognitive Status: Appears within functional limits for tasks assessed/performed Arousal/Alertness: Awake/alert Orientation Level: Appears  intact for tasks assessed Behavior During Session: [facility] for tasks performed    Balance  Static Standing Balance Static Standing - Balance Support: No upper extremity supported Static Standing - Level of Assistance: 5: Stand by assistance  End of Session PT - End of Session Equipment Utilized During Treatment: Gait belt Activity Tolerance: Patient tolerated treatment well;Patient limited by fatigue Patient left: in chair;with call bell/phone within reach    [place] 05/13/2011, 2:53 PM

## 2011-05-13 NOTE — Progress Notes (Signed)
INITIAL ADULT NUTRITION ASSESSMENT Date: 05/13/2011   Time: 12:36 PM Reason for Assessment: NPO/clear liquids x8 days  ASSESSMENT: Female 46 y.o.  Dx: Incisional hernia  Hx:  Past Medical History  Diagnosis Date  . Umbilical hernia   . Hypertension   . Asthma   . Seizures   . Stroke     last 2003  . Heart murmur     born with   . Dysrhythmia     skipped beats  . Myocardial infarction     hi of x 2  last one 2003   . COPD (chronic obstructive pulmonary disease)     chronic bronchitis   . Pneumonia     hx of 2009  . GERD (gastroesophageal reflux disease)   . Nephrolithiasis     frequent urination , urination at nite   . Glaucoma   . Legally blind     left eye   . Headache     migraines   . Neuropathy     due to diabetes   . Dizziness     secondary to diabetes and hypertension   . Arthritis     joint pain   . Hx of blood clots     hematomas removed from left side of brain from 70mo to 89yrold   . Epilepsy idiopathic petit mal     last seizure 2012;controlled w/ topomax  . Diabetic neuropathy, painful   . Diabetes mellitus     since age 89type 2 IDDM  . Sleep apnea     [facility] not use [facility] ; mild  . CVA 11/14/2007    Qualifier: Diagnosis of  By: MaHassell DoneNP, NyTori Milks  . MIGRAINE HEADACHE 09/14/2006    Qualifier: Diagnosis of  By: MaHassell DoneNP, NyTori Milks   Past Surgical History  Procedure Date  . Kidney stone removal   . Brain hematoma evacutation     five procedures total, first procedure when [age]onths old, last at [age]ears of age.  . Shunt removal     shunt inserted at age [age]emoved at age 46 . Eye surgery   . Retinal detachment surgery 1990  . Vaginal hysterectomy 1996  . Umbilical hernia repair 04/20/59/0960. Incisional hernia repair 05/05/2011    Procedure: LAPAROSCOPIC INCISIONAL HERNIA;  Surgeon: MaRolm BookbinderMD;  [facility] Service: General;  Laterality: N/A;  . Mass excision 05/05/2011    Procedure:  EXCISION MASS;  Surgeon: MaRolm BookbinderMD;  [facility];  Service: General;  Laterality: Right;    Related Meds:  Scheduled Meds:   . acetaminophen  650 mg Oral QID  . budesonide-formoterol  2 puff Inhalation BID  . enoxaparin (LOVENOX) injection  60 mg Subcutaneous Q24H  . insulin aspart  0-15 Units Subcutaneous TID WC  . insulin detemir  15 Units Subcutaneous BID  . losartan  50 mg Oral Daily  . pantoprazole (PROTONIX) IV  40 mg Intravenous QHS  . potassium chloride  10 mEq Intravenous Q1 Hr x 4  . potassium chloride  10 mEq Intravenous Q1 Hr x 2  . potassium chloride      . sodium chloride  3 mL Intravenous Q12H  . DISCONTD: famotidine  20 mg Oral BID  . DISCONTD: feeding supplement  1 Container Oral TID BM  . DISCONTD: glipiZIDE  20 mg Oral Daily  . DISCONTD: simvastatin  20 mg Oral QPM  . DISCONTD:  topiramate  200 mg Oral BID   Continuous Infusions:   . dextrose 5 % and 0.45 % NaCl with KCl 20 mEq/L 100 mL/hr at 05/13/11 1011  . DISCONTD: sodium chloride 75 mL/hr at 05/13/11 0358   PRN Meds:.albuterol, diphenhydrAMINE, hydrALAZINE, HYDROmorphone (DILAUDID) injection, iohexol, iohexol, ondansetron, sodium chloride, DISCONTD: baclofen, DISCONTD: oxyCODONE-acetaminophen   Ht: 5' 7"  (170.2 cm)  Wt: 261 lb (118.389 kg)  Ideal Wt: 61.3 kg % Ideal Wt: 192%  Usual Wt: same, sometime variable % Usual Wt: 100%  Body mass index is 40.88 kg/(m^2).  Pt is morbidly obese  Food/Nutrition Related Hx: poor glucose control (no recent HgBA1C on file), lives with daughter  Labs:  CMP     Component Value Date/Time   NA 141 05/13/2011 0457   K 3.1* 05/13/2011 0457   CL 106 05/13/2011 0457   CO2 22 05/13/2011 0457   GLUCOSE 152* 05/13/2011 0457   BUN 11 05/13/2011 0457   CREATININE 0.72 05/13/2011 0457   CALCIUM 8.9 05/13/2011 0457   PROT 6.4 10/15/2006 2145   ALBUMIN 3.1* 10/15/2006 2145   AST 11 10/15/2006 2145   ALT 14 10/15/2006 2145   ALKPHOS 47 10/15/2006 2145   BILITOT  0.6 10/15/2006 2145   GFRNONAA >90 05/13/2011 0457   GFRAA >90 05/13/2011 0457    CBC    Component Value Date/Time   WBC 10.3 05/10/2011 0535   RBC 3.56* 05/10/2011 0535   HGB 10.3* 05/10/2011 0535   HCT 32.7* 05/10/2011 0535   PLT 231 05/10/2011 0535   MCV 91.9 05/10/2011 0535   MCH 28.9 05/10/2011 0535   MCHC 31.5 05/10/2011 0535   RDW 14.6 05/10/2011 0535   LYMPHSABS 1.3 05/10/2011 0535   MONOABS 0.8 05/10/2011 0535   EOSABS 0.1 05/10/2011 0535   BASOSABS 0.0 05/10/2011 0535    Intake: 0-100% of liquid diet Output:   Intake/Output Summary (Last 24 hours) at 05/13/11 1242 Last data filed at 05/13/11 1005  Gross per 24 hour  Intake   1647 ml  Output    700 ml  Net    947 ml    Diet Order: NPO  Supplements/Tube Feeding: none at this time  IVF:    dextrose 5 % and 0.45 % NaCl with KCl 20 mEq/L Last Rate: 100 mL/hr at 05/13/11 1011  DISCONTD: sodium chloride Last Rate: 75 mL/hr at 05/13/11 0358    Estimated Nutritional Needs:   Kcal: 2115-2265 Protein: 90-105g Fluid: ~2.2 L  Pt admitted for repair of umbilicial hernia, now with partial obstruction.  Per MD note, continue to treat conservatively.  Pt with several episodes of emesis yesterday for which she has refused an NG tube.  Pt also with 3 BMs, +flatus.  Pt has not been re-weighed since admission.  RD unable to weigh at this time as pt is up in chair.  CBG (last 3)   Basename 05/13/11 1158 05/13/11 0818 05/12/11 2058  GLUCAP 135* 142* 132*    NUTRITION DIAGNOSIS: -Inadequate oral intake (NI-2.1).  Status: Ongoing  RELATED TO: omission of energy dense foods  AS EVIDENCE BY: pt NPO, unable to tolerate diet advancement trials  MONITORING/EVALUATION(Goals): 1.  Food/Beverage; diet advancement with tolerance, pt eating per her usual. 2.  Parenteral nutrition; initiation with tolerance if appropriate.  EDUCATION NEEDS: -No education needs identified at this time  INTERVENTION: 1.  Parenteral nutrition; per MD note,  TPN being considered if PSBO not resolved.  PharmD to manage. Recommend targeting minimal estimated needs and monitor  CBGs closely.  Dietitian #: [phone]  [place] Per approved criteria  -Morbid Obesity    Brynda Greathouse [facility] 05/13/2011, 12:36 PM

## 2011-05-13 NOTE — Progress Notes (Signed)
8 Days Post-Op  Subjective: Is passing some flatus, no more emesis overnight, ct overnight, no real abd pain  Objective: Vital signs in last 24 hours: Temp:  [98.5 F (36.9 C)-98.8 F (37.1 C)] 98.5 F (36.9 C) (04/24 0616) Pulse Rate:  [84-85] 84  (04/24 0616) Resp:  [14-16] 14  (04/24 0616) BP: (130-149)/(76-81) 130/76 mmHg (04/24 0616) SpO2:  [96 %] 96 % (04/24 0724) Last BM Date: 05/11/11  Intake/Output from previous day: 04/23 0701 - 04/24 0700 In: 2007 [P.O.:480; I.V.:1327; IV Piggyback:200] Out: 400 [Urine:400] Intake/Output this shift:    General appearance: no distress Resp: diminished breath sounds bibasilar Cardio: regular rate and rhythm GI: obese, some bs, approp tender, wounds clean, hard to tell if she is distended  Lab Results:  No results found for this basename: WBC:2,HGB:2,HCT:2,PLT:2 in the last 72 hours BMET  [facility] 05/13/11 0457 05/12/11 0841  NA 141 141  K 3.1* 3.2*  CL 106 107  CO2 22 22  GLUCOSE 152* 147*  BUN 11 12  CREATININE 0.72 0.78  CALCIUM 8.9 8.8     Assessment/Plan: POD #8 lap vh  1. Cont iv pain meds as needed 2. pulm toilet 3. She has what looks on ct like partial obstruction (clinically) that is not related to mesh or trocar site, we discussed this and hopefully gets better conservatively, we discussed ng which she refused this am, will make npo, check abd films to see if contrast progressing,if she does not resolve quickly she will need picc and tna soon 4. cbgs under good control 5. Lovenox, scds 6. Replace k today, add to iv fluids   LOS: 8 days    [facility] 05/13/2011

## 2011-05-13 NOTE — Progress Notes (Signed)
Pt continuing to refuse NGT placement.  Given the option to have it placed in Radiology under fluoro, refuses this as well.  Pt states she understands why it needs to be done but continues to refuse.  MD made aware.  Eliezer Bottom Cowan

## 2011-05-13 NOTE — Progress Notes (Signed)
UR complete 

## 2011-05-14 LAB — GLUCOSE, CAPILLARY
Glucose-Capillary: 166 mg/dL — ABNORMAL HIGH (ref 70–99)
Glucose-Capillary: 174 mg/dL — ABNORMAL HIGH (ref 70–99)
Glucose-Capillary: 140 mg/dL — ABNORMAL HIGH (ref 70–99)
Glucose-Capillary: 191 mg/dL — ABNORMAL HIGH (ref 70–99)

## 2011-05-14 NOTE — Progress Notes (Signed)
9 Days Post-Op  Subjective: Feels better, minimal nausea, no emesis, had a loose stool and is passing flatus  Objective: Vital signs in last 24 hours: Temp:  [97.6 F (36.4 C)-98.2 F (36.8 C)] 97.6 F (36.4 C) (04/25 0525) Pulse Rate:  [78-90] 78  (04/25 0525) Resp:  [18] 18  (04/25 0525) BP: (115-152)/(71-88) 118/71 mmHg (04/25 0525) SpO2:  [97 %-100 %] 98 % (04/25 0741) Last BM Date: 05/14/11  Intake/Output from previous day: 04/24 0701 - 04/25 0700 In: 2401 [I.V.:2401] Out: 900 [Urine:900] Intake/Output this shift:    CV: RRR Pulm: clear bilaterally Ab: obese, bs present, much less distended, wounds clean, approp tender BMET  Basename 05/13/11 0457 05/12/11 0841  NA 141 141  K 3.1* 3.2*  CL 106 107  CO2 22 22  GLUCOSE 152* 147*  BUN 11 12  CREATININE 0.72 0.78  CALCIUM 8.9 8.8    Studies/Results: Ct Abdomen Pelvis W Contrast  05/12/2011  *RADIOLOGY REPORT*  Clinical Data: Laparoscopic vaginal hysterectomy.  Still with ileus.  To rule out abscess.  Abdominal pain, nausea and vomiting.  CT ABDOMEN AND PELVIS WITH CONTRAST  Technique:  Multidetector CT imaging of the abdomen and pelvis was performed following the standard protocol during bolus administration of intravenous contrast.  Contrast: 171m OMNIPAQUE IOHEXOL 300 MG/ML  SOLN, 1078mOMNIPAQUE IOHEXOL 300 MG/ML  SOLN  Comparison: Plain radiograph 05/12/2011  Findings: Atelectasis in the lung bases.  The liver, spleen, gallbladder, pancreas, abdominal aorta, and retroperitoneal lymph nodes are unremarkable.  Some flattening of the distal inferior vena cava may represent changes of dehydration.  2.3 cm diameter nodule in the right adrenal gland. Hounsfield unit measurements are not diagnostic of fat content and therefore this is indeterminate. Further evaluation with unenhanced CT with without adrenal gland washout study is recommended.  Multiple calcifications in both kidneys without evidence of obstruction.  Largest  left renal calculus is in the mid pole and measures about 5 mm diameter. Largest right renal calculus as in the lower pole and measures about 4 mm diameter.  No ureteral stones are demonstrated.  The stomach and proximal small bowel are moderately distended and fluid-filled.  The distal small bowel is decompressed.  Changes are consistent with mechanical obstruction.  The transition zone is in the left lower quadrant.  The etiology is likely to be adhesions.  There is an umbilical hernia containing fat and nodularity suggesting fluid or scarring.  There is soft tissue thickening of the anterior abdominal wall adjacent to the hernia.  Given history of hernia repair, this may represent postoperative fluid, scarring, or small hematoma. This area measures about 2.1 x 6.1 cm.  There is no obvious bowel content within the hernia.  The colon is decompressed.  No colonic wall thickening.  No free fluid or free air in the abdomen.  Pelvis:  The appendix is normal.  Bladder wall is not thickened. Small amount of free fluid in the low pelvis is likely postoperative.  No loculated fluid collections.  Uterus is surgically absent.  No abnormal adnexal masses.  No significant pelvic lymphadenopathy.  Degenerative changes in the lumbar spine with normal alignment.  IMPRESSION: Distended stomach and proximal small bowel to the level of the left lower quadrant with decompressed distal bowel and colon consistent with mechanical obstruction, likely due to adhesions.  There is an umbilical/periumbilical hernia with associated fluid or scarring. No bowel content.  Small amount of free fluid in the pelvis is likely postoperative.  Original Report Authenticated  By: Neale Burly, M.D.   Dg Abd 2 Views  05/13/2011  *RADIOLOGY REPORT*  Clinical Data: Status post laparoscopic ventral hernia repair with partial obstruction.  Improving abdominal pain  ABDOMEN - 2 VIEW  Comparison: 05/12/2011  Findings: Again identified are dilated loops  of small bowel within the left lower quadrant and central abdomen.  These measure up to 4.7 cm.  There are also air-fluid levels identified on the upright images, similar to previous exam.  Gas is noted within the colon which is also unchanged from previous exam.  IMPRESSION: No change in bowel gas pattern compared with 05/12/2011.  Original Report Authenticated By: Angelita Ingles, M.D.     Assessment/Plan: POD #9 lap vh 1. I think her ileus vs obstruction is resolving despite no ng.  I will start her on clears today and hopefully will increase over next 48 hours and dc   LOS: 9 days    [facility] 05/14/2011

## 2011-05-14 NOTE — Progress Notes (Signed)
PT Cancellation Note  Treatment cancelled today due to patient's refusal to participate.  Pt stated that she has been up since 0645 and has been walking around the room and up and down the hall.  Pt states that she it too tired to walk right now.  PT will re-attempt at a later date.  Koleen Distance SPT 05/14/2011, 3:00 PM

## 2011-05-14 NOTE — Progress Notes (Signed)
Shelah Lewandowsky PT, DPT  Pager: [phone]

## 2011-05-15 LAB — GLUCOSE, CAPILLARY
Glucose-Capillary: 170 mg/dL — ABNORMAL HIGH (ref 70–99)
Glucose-Capillary: 145 mg/dL — ABNORMAL HIGH (ref 70–99)
Glucose-Capillary: 134 mg/dL — ABNORMAL HIGH (ref 70–99)
Glucose-Capillary: 151 mg/dL — ABNORMAL HIGH (ref 70–99)

## 2011-05-15 MED ORDER — GABAPENTIN 300 MG PO CAPS
300.0000 mg | ORAL_CAPSULE | Freq: Three times a day (TID) | ORAL | Status: DC
  Administered 2011-05-15 – 2011-05-17 (×8): 300 mg via ORAL
  Filled 2011-05-15 (×12): qty 1

## 2011-05-15 MED ORDER — BACLOFEN 10 MG PO TABS
10.0000 mg | ORAL_TABLET | Freq: Three times a day (TID) | ORAL | Status: DC | PRN
  Administered 2011-05-15 – 2011-05-16 (×3): 10 mg via ORAL
  Filled 2011-05-15 (×5): qty 1

## 2011-05-15 MED ORDER — TRAZODONE HCL 150 MG PO TABS
150.0000 mg | ORAL_TABLET | Freq: Every day | ORAL | Status: DC
  Administered 2011-05-15 – 2011-05-17 (×2): 150 mg via ORAL
  Filled 2011-05-15 (×4): qty 1

## 2011-05-15 MED ORDER — BIOTENE DRY MOUTH MT LIQD
15.0000 mL | Freq: Two times a day (BID) | OROMUCOSAL | Status: DC
  Administered 2011-05-15 – 2011-05-17 (×3): 15 mL via OROMUCOSAL

## 2011-05-15 MED ORDER — CHLORHEXIDINE GLUCONATE 0.12 % MT SOLN
15.0000 mL | Freq: Two times a day (BID) | OROMUCOSAL | Status: DC
  Administered 2011-05-15 – 2011-05-18 (×5): 15 mL via OROMUCOSAL
  Filled 2011-05-15 (×4): qty 15

## 2011-05-15 MED ORDER — TOPIRAMATE 100 MG PO TABS
200.0000 mg | ORAL_TABLET | Freq: Two times a day (BID) | ORAL | Status: DC
  Administered 2011-05-15 – 2011-05-17 (×5): 200 mg via ORAL
  Filled 2011-05-15 (×8): qty 2

## 2011-05-15 MED ORDER — BOOST / RESOURCE BREEZE PO LIQD
1.0000 | Freq: Two times a day (BID) | ORAL | Status: DC
  Administered 2011-05-15 – 2011-05-17 (×3): 1 via ORAL

## 2011-05-15 NOTE — Progress Notes (Signed)
10 Days Post-Op  Subjective: Passing flatus, had a bm, no n/v, tolerating clear liquids, having muscle spasms and wants to restart baclofen  Objective: Vital signs in last 24 hours: Temp:  [98 F (36.7 C)-99.1 F (37.3 C)] 98 F (36.7 C) (04/26 0648) Pulse Rate:  [77-102] 78  (04/26 0648) Resp:  [18-28] 19  (04/26 0648) BP: (118-134)/(71-88) 118/80 mmHg (04/26 0648) SpO2:  [96 %-100 %] 100 % (04/26 0648) Last BM Date: 05/14/11  Intake/Output from previous day: 04/25 0701 - 04/26 0700 In: 1075 [P.O.:360; I.V.:715] Out: 1150 [Urine:1150] Intake/Output this shift:    General appearance: no distress Resp: clear to auscultation bilaterally Cardio: regular rate and rhythm GI: soft, wounds clean, bs present, approp tender  Lab Results:  No results found for this basename: WBC:2,HGB:2,HCT:2,PLT:2 in the last 72 hours BMET  Basename 06-09-2011 0457 05/12/11 0841  NA 141 141  K 3.1* 3.2*  CL 106 107  CO2 22 22  GLUCOSE 152* 147*  BUN 11 12  CREATININE 0.72 0.78  CALCIUM 8.9 8.8   PT/INR No results found for this basename: LABPROT:2,INR:2 in the last 72 hours ABG No results found for this basename: PHART:2,PCO2:2,PO2:2,HCO3:2 in the last 72 hours  Studies/Results: Dg Abd 2 Views  06-09-2011  *RADIOLOGY REPORT*  Clinical Data: Status post laparoscopic ventral hernia repair with partial obstruction.  Improving abdominal pain  ABDOMEN - 2 VIEW  Comparison: 05/12/2011  Findings: Again identified are dilated loops of small bowel within the left lower quadrant and central abdomen.  These measure up to 4.7 cm.  There are also air-fluid levels identified on the upright images, similar to previous exam.  Gas is noted within the colon which is also unchanged from previous exam.  IMPRESSION: No change in bowel gas pattern compared with 05/12/2011.  Original Report Authenticated By: Angelita Ingles, M.D.    Anti-infectives: Anti-infectives     Start     Dose/Rate Route Frequency Ordered  Stop   05/05/11 1400   ceFAZolin (ANCEF) IVPB 1 g/50 mL premix  Status:  Discontinued        1 g 100 mL/hr over 30 Minutes Intravenous 3 times per day 05/05/11 1130 05/06/11 0807   05/05/11 0632   ceFAZolin (ANCEF) IVPB 2 g/50 mL premix  Status:  Discontinued        2 g 100 mL/hr over 30 Minutes Intravenous 60 min pre-op 05/05/11 1438 05/05/11 1112   05/04/11 1439   ceFAZolin (ANCEF) IVPB 2 g/50 mL premix  Status:  Discontinued        2 g 100 mL/hr over 30 Minutes Intravenous 60 min pre-op 05/04/11 1439 05/05/11 0633          Assessment/Plan: POD 10 lap vh 1. Po pain meds, iv backup 2. pulm toilet 3. Cont ssi, restart home meds when tol more 4. Wants to stay [facility], will give fulls in am, I think she is finally resolving 5. Lovenox, scds 6. Restart home meds  7.  Hopefully home later this weekend   LOS: 10 days    [facility] 05/15/2011

## 2011-05-16 LAB — GLUCOSE, CAPILLARY
Glucose-Capillary: 153 mg/dL — ABNORMAL HIGH (ref 70–99)
Glucose-Capillary: 128 mg/dL — ABNORMAL HIGH (ref 70–99)
Glucose-Capillary: 185 mg/dL — ABNORMAL HIGH (ref 70–99)
Glucose-Capillary: 159 mg/dL — ABNORMAL HIGH (ref 70–99)

## 2011-05-16 MED ORDER — BACLOFEN 10 MG PO TABS
10.0000 mg | ORAL_TABLET | Freq: Four times a day (QID) | ORAL | Status: DC | PRN
  Administered 2011-05-16 – 2011-05-17 (×2): 10 mg via ORAL
  Filled 2011-05-16 (×4): qty 1

## 2011-05-16 MED ORDER — BACLOFEN 10 MG PO TABS
10.0000 mg | ORAL_TABLET | Freq: Three times a day (TID) | ORAL | Status: DC | PRN
  Administered 2011-05-16 (×2): 10 mg via ORAL
  Filled 2011-05-16: qty 1

## 2011-05-16 NOTE — Progress Notes (Signed)
General Surgery Note  LOS: 11 days  POD# 11  Assessment/Plan: 1.  LAPAROSCOPIC INCISIONAL HERNIA repair, INSERTION OF MESH - Jeanmarie Hubert - 05/05/2011.  Prolonged ileus post op.  She vomited this am.  She looks okay.  Will leave on clear liquids.   2.  DIABETES MELLITUS, TYPE II (09/30/2006) 3.  HYPERLIPIDEMIA (09/13/2006)   4.  DEPRESSION (09/13/2006) 5.  HYPERTENSION (09/13/2006)    6.  GERD 7.  SEIZURE DISORDER   8.  Obesity, Class III, BMI 40-49.9 (morbid obesity) (05/10/2011) 9.  VTE - on Lovenox  10.  History of stroke with left sided weakness 11.  She is "medically retired" - on disability.  Subjective:  Vomited this AM.  But looks okay right now.  She gets back spasms and wants her Baclofen on her dosing schedule that she takes at home.  Sharee Pimple will talk to pharmacy about how to do that. Objective:   Filed Vitals:   05/16/11 0518  BP: 131/74  Pulse: 104  Temp: 98.2 F (36.8 C)  Resp: 20     Intake/Output from previous day:  04/26 0701 - 04/27 0700 In: 1504 [P.O.:520; I.V.:984] Out: 375 [Urine:375]  Intake/Output this shift:      Physical Exam:   General: Obese AA F who is alert and oriented.    HEENT: Normal. Pupils equal. .   Lungs: Clear   Abdomen: Mild distention.  Decreased, but present BS.   Wound: Okay.  Has dermabond   Neurologic:  Grossly intact to motor and sensory function.   Psychiatric: Behavior is normal.   Lab Results:   No results found for this basename: WBC:2,HGB:2,HCT:2,PLT:2 in the last 72 hours  BMET  No results found for this basename: NA:2,K:2,CL:2,CO2:2,GLUCOSE:2,BUN:2,CREATININE:2,CALCIUM:2 in the last 72 hours  PT/INR  No results found for this basename: LABPROT:2,INR:2 in the last 72 hours  ABG  No results found for this basename: PHART:2,PCO2:2,PO2:2,HCO3:2 in the last 72 hours   Studies/Results:  No results found.   Anti-infectives:   Anti-infectives     Start     Dose/Rate Route Frequency Ordered Stop   05/05/11 1400    ceFAZolin (ANCEF) IVPB 1 g/50 mL premix  Status:  Discontinued        1 g 100 mL/hr over 30 Minutes Intravenous 3 times per day 05/05/11 1130 05/06/11 0807   05/05/11 0632   ceFAZolin (ANCEF) IVPB 2 g/50 mL premix  Status:  Discontinued        2 g 100 mL/hr over 30 Minutes Intravenous 60 min pre-op 05/05/11 1700 05/05/11 1112   05/04/11 1439   ceFAZolin (ANCEF) IVPB 2 g/50 mL premix  Status:  Discontinued        2 g 100 mL/hr over 30 Minutes Intravenous 60 min pre-op 05/04/11 1439 05/05/11 0633          Alphonsa Overall, MD, FACS Pager: [phone],   [facility] Office: [phone] 05/16/2011

## 2011-05-17 LAB — GLUCOSE, CAPILLARY
Glucose-Capillary: 190 mg/dL — ABNORMAL HIGH (ref 70–99)
Glucose-Capillary: 152 mg/dL — ABNORMAL HIGH (ref 70–99)
Glucose-Capillary: 146 mg/dL — ABNORMAL HIGH (ref 70–99)
Glucose-Capillary: 157 mg/dL — ABNORMAL HIGH (ref 70–99)

## 2011-05-17 NOTE — Progress Notes (Signed)
General Surgery Note  LOS: 12 days  POD# 11  Assessment/Plan: 1.  LAPAROSCOPIC INCISIONAL HERNIA repair, INSERTION OF MESH - Jeanmarie Hubert - 05/05/2011.  In better spirits today.  No vomiting.  Had 2 BM's last night/this AM.  To advance diet to full liquids.  Maybe turning the corner.  Still needs to ambulate.   2.  DIABETES MELLITUS, TYPE II (09/30/2006) 3.  HYPERLIPIDEMIA (09/13/2006)   4.  DEPRESSION (09/13/2006) 5.  HYPERTENSION (09/13/2006)    6.  GERD 7.  SEIZURE DISORDER   8.  Obesity, Class III, BMI 40-49.9 (morbid obesity) (05/10/2011) 9.  VTE - on Lovenox  10.  History of stroke with left sided weakness 11.  She is "medically retired" - on disability.  Subjective:  Had BM.  Looks and is doing better.  Objective:   Filed Vitals:   05/17/11 0616  BP: 115/73  Pulse: 78  Temp: 98.1 F (36.7 C)  Resp: 17     Intake/Output from previous day:  04/27 0701 - 04/28 0700 In: 2739 [P.O.:1200; I.V.:771; IV Piggyback:768] Out: 425 [Urine:425]  Intake/Output this shift:  Total I/O In: 360 [P.O.:360] Out: -    Physical Exam:   General: Obese AA F who is alert and oriented.    HEENT: Normal. Pupils equal. .   Lungs: Clear   Abdomen: Obese.  Few BS.  Soft.   Wound: Okay.  Has dermabond   Neurologic:  Grossly intact to motor and sensory function.   Psychiatric: Behavior is normal.   Lab Results:   No results found for this basename: WBC:2,HGB:2,HCT:2,PLT:2 in the last 72 hours  BMET  No results found for this basename: NA:2,K:2,CL:2,CO2:2,GLUCOSE:2,BUN:2,CREATININE:2,CALCIUM:2 in the last 72 hours  PT/INR  No results found for this basename: LABPROT:2,INR:2 in the last 72 hours  ABG  No results found for this basename: PHART:2,PCO2:2,PO2:2,HCO3:2 in the last 72 hours   Studies/Results:  No results found.   Anti-infectives:   Anti-infectives     Start     Dose/Rate Route Frequency Ordered Stop   05/05/11 1400   ceFAZolin (ANCEF) IVPB 1 g/50 mL premix  Status:   Discontinued        1 g 100 mL/hr over 30 Minutes Intravenous 3 times per day 05/05/11 1130 05/06/11 0807   05/05/11 0632   ceFAZolin (ANCEF) IVPB 2 g/50 mL premix  Status:  Discontinued        2 g 100 mL/hr over 30 Minutes Intravenous 60 min pre-op 05/05/11 3254 05/05/11 1112   05/04/11 1439   ceFAZolin (ANCEF) IVPB 2 g/50 mL premix  Status:  Discontinued        2 g 100 mL/hr over 30 Minutes Intravenous 60 min pre-op 05/04/11 1439 05/05/11 0633          Alphonsa Overall, MD, FACS Pager: [phone],   [facility] Office: [phone] 05/17/2011

## 2011-05-18 LAB — GLUCOSE, CAPILLARY: Glucose-Capillary: 136 mg/dL — ABNORMAL HIGH (ref 70–99)

## 2011-05-18 MED ORDER — OXYCODONE-ACETAMINOPHEN 10-325 MG PO TABS: 1.0000 | ORAL_TABLET | ORAL | Status: DC | PRN

## 2011-05-18 NOTE — Progress Notes (Signed)
13 Days Post-Op  Subjective: Tol diet, having bm, no n/v  Objective: Vital signs in last 24 hours: Temp:  [97.9 F (36.6 C)-98.7 F (37.1 C)] 97.9 F (36.6 C) (04/28 2215) Pulse Rate:  [77-86] 77  (04/28 2215) Resp:  [18-19] 19  (04/28 2215) BP: (121-140)/(76-93) 140/76 mmHg (04/28 2215) SpO2:  [94 %-100 %] 94 % (04/28 2215) Last BM Date: 05/17/11  Intake/Output from previous day: 04/28 0701 - 04/29 0700 In: 1571 [P.O.:360; I.V.:1211] Out: -  Intake/Output this shift: Total I/O In: 707 [I.V.:707] Out: -   GI: soft, nt/nd bs present wounds clean  Lab Results:  No results found for this basename: WBC:2,HGB:2,HCT:2,PLT:2 in the last 72 hours BMET No results found for this basename: NA:2,K:2,CL:2,CO2:2,GLUCOSE:2,BUN:2,CREATININE:2,CALCIUM:2 in the last 72 hours PT/INR No results found for this basename: LABPROT:2,INR:2 in the last 72 hours ABG No results found for this basename: PHART:2,PCO2:2,PO2:2,HCO3:2 in the last 72 hours  Studies/Results: No results found.  Anti-infectives: Anti-infectives     Start     Dose/Rate Route Frequency Ordered Stop   05/05/11 1400   ceFAZolin (ANCEF) IVPB 1 g/50 mL premix  Status:  Discontinued        1 g 100 mL/hr over 30 Minutes Intravenous 3 times per day 05/05/11 1130 05/06/11 0807   05/05/11 0632   ceFAZolin (ANCEF) IVPB 2 g/50 mL premix  Status:  Discontinued        2 g 100 mL/hr over 30 Minutes Intravenous 60 min pre-op 05/05/11 6837 05/05/11 1112   05/04/11 1439   ceFAZolin (ANCEF) IVPB 2 g/50 mL premix  Status:  Discontinued        2 g 100 mL/hr over 30 Minutes Intravenous 60 min pre-op 05/04/11 1439 05/05/11 0633          Assessment/Plan: POD 12  Ileus resolved, dc home today if can setup   LOS: 13 days    [facility] 05/18/2011

## 2011-05-18 NOTE — Progress Notes (Signed)
Discharge instructions/Med Rec Sheet reviewed w/ pt. Pt expressed understanding and copies given w/ prescriptions. Pt d/c'd in stable condition via w/c, accompanied by discharge volunteers

## 2011-05-20 ENCOUNTER — Emergency Department (HOSPITAL_COMMUNITY): Payer: PRIVATE HEALTH INSURANCE

## 2011-05-20 ENCOUNTER — Encounter (HOSPITAL_COMMUNITY): Payer: Self-pay | Admitting: *Deleted

## 2011-05-20 ENCOUNTER — Observation Stay (HOSPITAL_COMMUNITY)
Admission: EM | Admit: 2011-05-20 | Discharge: 2011-05-21 | Disposition: A | Discharge status: Home / Self Care | Payer: PRIVATE HEALTH INSURANCE | Attending: Emergency Medicine | Admitting: Emergency Medicine

## 2011-05-20 DIAGNOSIS — I10 Essential (primary) hypertension: Secondary | ICD-10-CM | POA: Insufficient documentation

## 2011-05-20 DIAGNOSIS — Z8673 Personal history of transient ischemic attack (TIA), and cerebral infarction without residual deficits: Secondary | ICD-10-CM | POA: Insufficient documentation

## 2011-05-20 DIAGNOSIS — G4733 Obstructive sleep apnea (adult) (pediatric): Secondary | ICD-10-CM | POA: Insufficient documentation

## 2011-05-20 DIAGNOSIS — I252 Old myocardial infarction: Secondary | ICD-10-CM | POA: Insufficient documentation

## 2011-05-20 DIAGNOSIS — E1142 Type 2 diabetes mellitus with diabetic polyneuropathy: Secondary | ICD-10-CM | POA: Insufficient documentation

## 2011-05-20 DIAGNOSIS — K219 Gastro-esophageal reflux disease without esophagitis: Secondary | ICD-10-CM | POA: Insufficient documentation

## 2011-05-20 DIAGNOSIS — N12 Tubulo-interstitial nephritis, not specified as acute or chronic: Principal | ICD-10-CM

## 2011-05-20 DIAGNOSIS — J42 Unspecified chronic bronchitis: Secondary | ICD-10-CM | POA: Insufficient documentation

## 2011-05-20 DIAGNOSIS — E1149 Type 2 diabetes mellitus with other diabetic neurological complication: Secondary | ICD-10-CM | POA: Insufficient documentation

## 2011-05-20 DIAGNOSIS — I499 Cardiac arrhythmia, unspecified: Secondary | ICD-10-CM | POA: Insufficient documentation

## 2011-05-20 DIAGNOSIS — Z9889 Other specified postprocedural states: Secondary | ICD-10-CM | POA: Insufficient documentation

## 2011-05-20 LAB — COMPREHENSIVE METABOLIC PANEL
ALT: 17 U/L (ref 0–35)
AST: 11 U/L (ref 0–37)
Albumin: 3.1 g/dL — ABNORMAL LOW (ref 3.5–5.2)
Alkaline Phosphatase: 85 U/L (ref 39–117)
BUN: 7 mg/dL (ref 6–23)
CO2: 23 mEq/L (ref 19–32)
Calcium: 9.2 mg/dL (ref 8.4–10.5)
Chloride: 104 mEq/L (ref 96–112)
Creatinine, Ser: 0.77 mg/dL (ref 0.50–1.10)
GFR calc Af Amer: >90 mL/min (ref >90)
GFR calc non Af Amer: >90 mL/min (ref >90)
Glucose, Bld: 150 mg/dL — ABNORMAL HIGH (ref 70–99)
Potassium: 3.2 mEq/L — ABNORMAL LOW (ref 3.5–5.1)
Sodium: 137 mEq/L (ref 135–145)
Total Bilirubin: 0.2 mg/dL — ABNORMAL LOW (ref 0.3–1.2)
Total Protein: 7.2 g/dL (ref 6.0–8.3)

## 2011-05-20 LAB — LACTIC ACID, PLASMA: Lactic Acid, Venous: 0.8 mmol/L (ref 0.5–2.2)

## 2011-05-20 LAB — URINALYSIS, ROUTINE W REFLEX MICROSCOPIC
Bilirubin Urine: NEGATIVE
Glucose, UA: NEGATIVE mg/dL
Ketones, ur: 15 mg/dL — AB
Nitrite: POSITIVE — AB
Protein, ur: NEGATIVE mg/dL
Specific Gravity, Urine: 1.017 (ref 1.005–1.030)
Urobilinogen, UA: 0.2 mg/dL (ref 0.0–1.0)
pH: 6 (ref 5.0–8.0)

## 2011-05-20 LAB — CBC
HCT: 35.9 % — ABNORMAL LOW (ref 36.0–46.0)
Hemoglobin: 11.6 g/dL — ABNORMAL LOW (ref 12.0–15.0)
MCH: 28.6 pg (ref 26.0–34.0)
MCHC: 32.3 g/dL (ref 30.0–36.0)
MCV: 88.4 fL (ref 78.0–100.0)
Platelets: 439 10*3/uL — ABNORMAL HIGH (ref 150–400)
RBC: 4.06 MIL/uL (ref 3.87–5.11)
RDW: 14.1 % (ref 11.5–15.5)
WBC: 8.5 10*3/uL (ref 4.0–10.5)

## 2011-05-20 LAB — DIFFERENTIAL
Basophils Absolute: 0 10*3/uL (ref 0.0–0.1)
Basophils Relative: 0 % (ref 0–1)
Eosinophils Absolute: 0 10*3/uL (ref 0.0–0.7)
Eosinophils Relative: 0 % (ref 0–5)
Lymphocytes Relative: 18 % (ref 12–46)
Lymphs Abs: 1.6 10*3/uL (ref 0.7–4.0)
Monocytes Absolute: 0.8 10*3/uL (ref 0.1–1.0)
Monocytes Relative: 9 % (ref 3–12)
Neutro Abs: 6.1 10*3/uL (ref 1.7–7.7)
Neutrophils Relative %: 72 % (ref 43–77)

## 2011-05-20 LAB — GLUCOSE, CAPILLARY
Glucose-Capillary: 147 mg/dL — ABNORMAL HIGH (ref 70–99)
Glucose-Capillary: 151 mg/dL — ABNORMAL HIGH (ref 70–99)

## 2011-05-20 LAB — URINE MICROSCOPIC-ADD ON

## 2011-05-20 LAB — LIPASE, BLOOD: Lipase: 43 U/L (ref 11–59)

## 2011-05-20 MED ORDER — DEXTROSE 5 % IV SOLN
1.0000 g | Freq: Two times a day (BID) | INTRAVENOUS | Status: DC
  Administered 2011-05-20: 1 g via INTRAVENOUS
  Filled 2011-05-20: qty 10

## 2011-05-20 MED ORDER — TRAZODONE HCL 150 MG PO TABS
150.0000 mg | ORAL_TABLET | ORAL | Status: AC
  Administered 2011-05-20: 150 mg via ORAL
  Filled 2011-05-20 (×3): qty 1

## 2011-05-20 MED ORDER — SODIUM CHLORIDE 0.9 % IV SOLN: 1000.0000 mL | INTRAVENOUS | Status: DC

## 2011-05-20 MED ORDER — DEXTROSE 5 % IV SOLN
1.0000 g | Freq: Once | INTRAVENOUS | Status: AC
  Administered 2011-05-20: 1 g via INTRAVENOUS
  Filled 2011-05-20: qty 10

## 2011-05-20 MED ORDER — TRAZODONE HCL 150 MG PO TABS: 150.0000 mg | ORAL_TABLET | Freq: Every day | ORAL | Status: DC

## 2011-05-20 MED ORDER — GABAPENTIN 300 MG PO CAPS
300.0000 mg | ORAL_CAPSULE | Freq: Three times a day (TID) | ORAL | Status: DC
  Filled 2011-05-20 (×2): qty 1

## 2011-05-20 MED ORDER — ONDANSETRON HCL 4 MG/2ML IJ SOLN
4.0000 mg | Freq: Once | INTRAMUSCULAR | Status: AC
  Administered 2011-05-20: 4 mg via INTRAVENOUS
  Filled 2011-05-20: qty 2

## 2011-05-20 MED ORDER — SODIUM CHLORIDE 0.9 % IV SOLN
1000.0000 mL | Freq: Once | INTRAVENOUS | Status: AC
  Administered 2011-05-20: 1000 mL via INTRAVENOUS

## 2011-05-20 MED ORDER — HYDROMORPHONE HCL PF 1 MG/ML IJ SOLN
1.0000 mg | Freq: Four times a day (QID) | INTRAMUSCULAR | Status: DC | PRN
  Administered 2011-05-21: 1 mg via INTRAVENOUS
  Filled 2011-05-20: qty 1

## 2011-05-20 MED ORDER — INSULIN DETEMIR 100 UNIT/ML ~~LOC~~ SOLN: 30.0000 [IU] | Freq: Two times a day (BID) | SUBCUTANEOUS | Status: DC

## 2011-05-20 MED ORDER — SODIUM CHLORIDE 0.9 % IV SOLN
1000.0000 mL | INTRAVENOUS | Status: DC
  Administered 2011-05-20: 1000 mL via INTRAVENOUS

## 2011-05-20 MED ORDER — ONDANSETRON HCL 4 MG/2ML IJ SOLN
4.0000 mg | Freq: Four times a day (QID) | INTRAMUSCULAR | Status: DC | PRN
  Administered 2011-05-21: 4 mg via INTRAVENOUS
  Filled 2011-05-20: qty 2

## 2011-05-20 MED ORDER — TOPIRAMATE 100 MG PO TABS
200.0000 mg | ORAL_TABLET | ORAL | Status: AC
  Administered 2011-05-20: 200 mg via ORAL
  Filled 2011-05-20: qty 2

## 2011-05-20 MED ORDER — CEPHALEXIN 250 MG PO CAPS: 500.0000 mg | ORAL_CAPSULE | Freq: Once | ORAL | Status: DC

## 2011-05-20 MED ORDER — SIMVASTATIN 20 MG PO TABS
20.0000 mg | ORAL_TABLET | ORAL | Status: AC
  Administered 2011-05-20: 20 mg via ORAL
  Filled 2011-05-20: qty 1

## 2011-05-20 MED ORDER — INSULIN ASPART 100 UNIT/ML ~~LOC~~ SOLN: 2.0000 [IU] | Freq: Four times a day (QID) | SUBCUTANEOUS | Status: DC

## 2011-05-20 MED ORDER — MORPHINE SULFATE 4 MG/ML IJ SOLN
4.0000 mg | Freq: Once | INTRAMUSCULAR | Status: AC
  Administered 2011-05-20: 4 mg via INTRAVENOUS
  Filled 2011-05-20: qty 1

## 2011-05-20 MED ORDER — POTASSIUM CHLORIDE CRYS ER 20 MEQ PO TBCR
40.0000 meq | EXTENDED_RELEASE_TABLET | Freq: Once | ORAL | Status: AC
  Administered 2011-05-20: 40 meq via ORAL
  Filled 2011-05-20: qty 2

## 2011-05-20 MED ORDER — BACLOFEN 10 MG PO TABS
10.0000 mg | ORAL_TABLET | Freq: Three times a day (TID) | ORAL | Status: DC
  Filled 2011-05-20 (×2): qty 1

## 2011-05-20 MED ORDER — FAMOTIDINE 20 MG PO TABS
20.0000 mg | ORAL_TABLET | Freq: Two times a day (BID) | ORAL | Status: DC
  Administered 2011-05-20: 20 mg via ORAL
  Filled 2011-05-20: qty 1

## 2011-05-20 MED ORDER — ALBUTEROL SULFATE HFA 108 (90 BASE) MCG/ACT IN AERS: 2.0000 | INHALATION_SPRAY | RESPIRATORY_TRACT | Status: DC | PRN

## 2011-05-20 MED ORDER — INSULIN ASPART 100 UNIT/ML ~~LOC~~ SOLN: 0.0000 [IU] | Freq: Every day | SUBCUTANEOUS | Status: DC

## 2011-05-20 MED ORDER — INSULIN ASPART 100 UNIT/ML ~~LOC~~ SOLN: 6.0000 [IU] | Freq: Three times a day (TID) | SUBCUTANEOUS | Status: DC

## 2011-05-20 MED ORDER — TOPIRAMATE 100 MG PO TABS
200.0000 mg | ORAL_TABLET | Freq: Two times a day (BID) | ORAL | Status: DC
  Filled 2011-05-20: qty 2

## 2011-05-20 MED ORDER — GLIPIZIDE ER 10 MG PO TB24
20.0000 mg | ORAL_TABLET | Freq: Every day | ORAL | Status: DC
  Filled 2011-05-20: qty 2

## 2011-05-20 MED ORDER — SIMVASTATIN 20 MG PO TABS
20.0000 mg | ORAL_TABLET | Freq: Every evening | ORAL | Status: DC
  Filled 2011-05-20: qty 1

## 2011-05-20 MED ORDER — BUDESONIDE-FORMOTEROL FUMARATE 160-4.5 MCG/ACT IN AERO
2.0000 | INHALATION_SPRAY | Freq: Two times a day (BID) | RESPIRATORY_TRACT | Status: DC
  Filled 2011-05-20: qty 6

## 2011-05-20 MED ORDER — GABAPENTIN 300 MG PO CAPS
300.0000 mg | ORAL_CAPSULE | ORAL | Status: AC
  Administered 2011-05-20: 300 mg via ORAL
  Filled 2011-05-20: qty 1

## 2011-05-20 MED ORDER — INSULIN ASPART 100 UNIT/ML ~~LOC~~ SOLN: 0.0000 [IU] | Freq: Three times a day (TID) | SUBCUTANEOUS | Status: DC

## 2011-05-20 MED ORDER — BACLOFEN 10 MG PO TABS
10.0000 mg | ORAL_TABLET | ORAL | Status: AC
  Administered 2011-05-20: 10 mg via ORAL
  Filled 2011-05-20: qty 1

## 2011-05-20 NOTE — ED Notes (Signed)
Patient is resting

## 2011-05-20 NOTE — ED Provider Notes (Signed)
History   This chart was scribed for Chauncy Passy, MD by Malen Gauze. The patient was seen in room STRE7/STRE7 and the patient's care was started at 11:57AM.    CSN: [id number]  Arrival date & time 05/20/11  1108   First MD Initiated Contact with Patient 05/20/11 1149      Chief Complaint  Patient presents with  . Post-op Problem    hernia repair 4/16, d/c yesterday and having abdominal pain and nausea    (Consider location/radiation/quality/duration/timing/severity/associated sxs/prior treatment) HPI [name] is a 46 y.o. female who presents to the Emergency Department complaining of constant, moderate to severe abdominal pain with associated nausea and emesis with an onset 2 days ago pertaining to a recent operation. Pt had hernia repair on 4/16 and was discharged on 4/29 and continues to have current symptoms. Dr. Donne Hazel did the surgery. Pt talked to PCP, who she was supposed to f/u w/ in 2 weeks; told pt to come to the ED. Pt rates the pain 9.5/10. Last episode of emesis was yesterday. Last bowel movement was 2 days ago x3. No contact with sick individuals. Decreased appetite present due to constant emesis. Chills present. No HA, fever, neck pain, sore throat, rash, back pain, CP, SOB, diarrhea, dysuria, or extremity pain, edema, weakness, numbness, or tingling. Allergic to codeine. No other pertinent medical symptoms.  PCP: Dr. Jeanie Cooks  Past Medical History  Diagnosis Date  . Umbilical hernia   . Hypertension   . Asthma   . Seizures   . Stroke     last 2003  . Heart murmur     born with   . Dysrhythmia     skipped beats  . Myocardial infarction     hi of x 2  last one 2003   . COPD (chronic obstructive pulmonary disease)     chronic bronchitis   . Pneumonia     hx of 2009  . GERD (gastroesophageal reflux disease)   . Nephrolithiasis     frequent urination , urination at nite   . Glaucoma   . Legally blind     left eye   . Headache     migraines   .  Neuropathy     due to diabetes   . Dizziness     secondary to diabetes and hypertension   . Arthritis     joint pain   . Hx of blood clots     hematomas removed from left side of brain from 50mo to 89yrold   . Epilepsy idiopathic petit mal     last seizure 2012;controlled w/ topomax  . Diabetic neuropathy, painful   . Diabetes mellitus     since age [age]type 2 IDDM  . Sleep apnea     [facility];does not use Cpap ; mild  . CVA 11/14/2007    Qualifier: Diagnosis of  By: MaHassell DoneNP, NyTori Milks  . MIGRAINE HEADACHE 09/14/2006    Qualifier: Diagnosis of  By: MaHassell DoneNP, NyTori Milks    Past Surgical History  Procedure Date  . Kidney stone removal   . Brain hematoma evacutation     five procedures total, first procedure when 89onths old, last at [age]ears of age.  . Shunt removal     shunt inserted at age [age]emoved at age 46 . Eye surgery   . Retinal detachment surgery 1990  . Vaginal hysterectomy 1996  . Umbilical hernia repair  05/05/2011  . Incisional hernia repair 05/05/2011    Procedure: LAPAROSCOPIC INCISIONAL HERNIA;  Surgeon: Rolm Bookbinder, MD;  Location: [place];  Service: General;  Laterality: N/A;  . Mass excision 05/05/2011    Procedure: EXCISION MASS;  Surgeon: Rolm Bookbinder, MD;  Location: [place];  Service: General;  Laterality: Right;  . Abdominal surgery     Family History  Problem Relation Age of Onset  . Cancer Mother     uterine  . Cancer Maternal Grandmother     brain  . Anesthesia problems Neg Hx     History  Substance Use Topics  . Smoking status: Former Smoker -- 1.0 packs/day for 30 years    Types: Cigarettes    Quit date: 01/20/1987  . Smokeless tobacco: Never Used  . Alcohol Use: No    OB History    Grav Para Term Preterm Abortions TAB SAB Ect Mult Living                  Review of Systems 10 Systems reviewed and all are negative for acute change except as noted in the HPI.   Allergies  Codeine  Home  Medications   Current Outpatient Rx  Name Route Sig Dispense Refill  . ALBUTEROL SULFATE HFA 108 (90 BASE) MCG/ACT IN AERS Inhalation Inhale 2 puffs into the lungs every 6 (six) hours as needed. Wheezing     . BACLOFEN 10 MG PO TABS Oral Take 10 mg by mouth 3 (three) times daily.    . BUDESONIDE-FORMOTEROL FUMARATE 160-4.5 MCG/ACT IN AERO Inhalation Inhale 2 puffs into the lungs 2 (two) times daily.    [name] GABAPENTIN 300 MG PO CAPS Oral Take 300 mg by mouth 3 (three) times daily.    [name] GLIPIZIDE ER 10 MG PO TB24 Oral Take 20 mg by mouth daily.     . INSULIN ASPART 100 UNIT/ML [place] SOLN Subcutaneous Inject 2-14 Units into the skin 4 (four) times daily. Sliding scale    . INSULIN DETEMIR 100 UNIT/ML [place] SOLN Subcutaneous Inject 30-40 Units into the skin 2 (two) times daily. 30 am 40 pm    . OXYCODONE-ACETAMINOPHEN 10-325 MG PO TABS Oral Take 1 tablet by mouth every 4 (four) hours as needed for pain. 30 tablet 0  . RANITIDINE HCL 150 MG PO TABS Oral Take 150 mg by mouth 2 (two) times daily.    [name] SIMVASTATIN 20 MG PO TABS Oral Take 20 mg by mouth every evening.    . TOPIRAMATE 200 MG PO TABS Oral Take 200 mg by mouth 2 (two) times daily.    . TRAZODONE HCL 50 MG PO TABS Oral Take 150 mg by mouth at bedtime.       BP 125/76  Pulse 92  Temp(Src) 98 F (36.7 C) (Oral)  Resp 18  SpO2 97%  Physical Exam  Nursing note and vitals reviewed. Constitutional: She is oriented to person, place, and time. She appears well-developed and well-nourished. No distress.  HENT:  Head: Normocephalic and atraumatic.  Eyes: EOM are normal.  Neck: Normal range of motion. Neck supple. No tracheal deviation present.  Cardiovascular: Normal rate.   Pulmonary/Chest: Effort normal. No respiratory distress.  Abdominal: Soft. There is tenderness (diffuse abd tenderness). There is no rebound and no guarding.       No bowel sounds present. Patient has multiple laparoscopy scars that are all clean, dry, and intact with no  evidence of drainage or erythema surrounding these.  Genitourinary:  No impaction noted on rectal exam. Soft brown stool on the glove. No gross blood.  Musculoskeletal: Normal range of motion.  Neurological: She is alert and oriented to person, place, and time. No cranial nerve deficit. She exhibits normal muscle tone. Coordination normal.  Skin: Skin is warm and dry.       Surgical site healing well  Psychiatric: She has a normal mood and affect. Her behavior is normal.    ED Course  Procedures (including critical care time)  DIAGNOSTIC STUDIES: Oxygen Saturation is 97% on room air, normal by my interpretation.    COORDINATION OF CARE:  12:05PM - EDMD will order pain meds, zofran, and UA for the pt. 1:26PM - recheck; EDMD will perform a rectal exam. 4:06PM - rectal exam was performed; exam was nml, stool is soft   Labs Reviewed  CBC - Abnormal; Notable for the following:    Hemoglobin 11.6 (*)    HCT 35.9 (*)    Platelets 439 (*)    All other components within normal limits  GLUCOSE, CAPILLARY - Abnormal; Notable for the following:    Glucose-Capillary 147 (*)    All other components within normal limits  DIFFERENTIAL  COMPREHENSIVE METABOLIC PANEL  LIPASE, BLOOD  LACTIC ACID, PLASMA  URINALYSIS, ROUTINE W REFLEX MICROSCOPIC   Dg Abd Acute W/chest  05/20/2011  *RADIOLOGY REPORT*  Clinical Data: Hernia repair.  Abdominal pain.  ACUTE ABDOMEN SERIES (ABDOMEN 2 VIEW & CHEST 1 VIEW)  Comparison: 05/13/2011  Findings: Heart size and mediastinal contours are normal.  There is no pleural effusion or pulmonary edema.  There is no airspace consolidation identified.  A few dilated loops of small bowel are noted within the left abdomen.  These measure up to 3.3 mm.  Some gas and stool noted within the colon.  IMPRESSION:  1.  No specific features to suggest high-grade bowel obstruction. When compared with 05/13/2011 the degree of small bowel distention has slightly improved.   Original Report Authenticated By: Angelita Ingles, M.D.     1. Pyelonephritis       MDM  Patient was evaluated by myself. Complained of abdominal pain and was status post hernia surgery on April 16. Patient had had several good bowel movements while in the hospital. She has not taken any pain medication since discharge as her family has not been able to pick this up for her. She describes having some nausea and vomiting. Patient denies any urinary symptoms but has had a urinary catheter in during previous hospitalization. She does also endorse a slight cough. Patient denies any fevers. Patient was evaluated and had no leukocytosis, no elevation of lactate, no compromise of her renal function, and no hyperglycemia. Chest x-ray was actually improved from prior with unremarkable acute abdominal series compared to prior as well. Patient did not have any concern for postsurgical infection from an abdominal standpoint. Patient was him dynamically stable. Urinalysis was concerning for urinary tract infection and patient was admitted to CDU for pyelonephritis protocol. Patient was given IV Rocephin. Patient remained hemodynamically stable and report was given to mid level. I personally performed the services described in this documentation, which was scribed in my presence. The recorded information has been reviewed and considered.           Chauncy Passy, MD 05/20/11 2109

## 2011-05-20 NOTE — ED Notes (Signed)
Pt states that she will have I/O cath done when needed.  Pt states that she does not need to urinate currently and if she has to have done in stretcher triage, she will but would prefer not to. MD made aware

## 2011-05-20 NOTE — ED Provider Notes (Signed)
CDU Admission note  Abdomen: Soft, positive bowel sounds, diffuse mild tenderness to palpation. Multiple small laparoscopy scars are clean, dry, intact, and without any surrounding erythema or drainage concerning for infection. Rectal exam shows no fecal impaction and only soft brown stool on the glove.  Patient was evaluated by myself. Based on her urinary tract infection as well as her slight nausea and pain admission to CDU is felt appropriate. Patient was given Rocephin IV. Urine culture was sent. Patient was able to tolerate by mouth prior to admission as she was able to take oral potassium. Given patient's recent postsurgical state as well as her need for narcotic pain medication to control her symptoms the patient will need to be discharged with a prescription for MiraLAX to be taken 3 times a day until bowel movements become regular. Patient is also to be discharged with Keflex for her urinary tract infection. Patient will also need another prescription for her narcotic pain medication as she has never gotten this filled and has lost the prescription.  Chauncy Passy, MD 05/20/11 2111

## 2011-05-20 NOTE — ED Notes (Signed)
Family at bedside. 

## 2011-05-20 NOTE — ED Notes (Signed)
Pt's CBG is 147 . RN notified.

## 2011-05-20 NOTE — ED Notes (Signed)
Pt moved to CDU per MD order

## 2011-05-20 NOTE — ED Notes (Signed)
Patient is resting; blanket given to pt by registration

## 2011-05-20 NOTE — ED Notes (Signed)
Pt daughter called to check on status of pt.  Pt lives with daughter.  States she will come to see the pt in the morning and her son will take her home in the morning.

## 2011-05-20 NOTE — ED Notes (Signed)
Pt is here due to abdominal pain and nausea post op.  Pt had hernia repair on 4/16 and was discharged on 4/29.  Pt continues to have abdominal pain and nausea and vomiting.  "cant keep anything on stomach".  No fever or chills, surgical site WNL, healing well

## 2011-05-20 NOTE — ED Notes (Signed)
Pt states that she has "nothing to take for nausea" and hasn't filled her pain medication prescription.  She states that she can't take what was prescribed due to her codeine allergy. Pt states that the prescription she has is for "percocet with codeine", notified pt that percocet does not contain codeine.  Pt appears overwhelmed and anxious about her symptoms.

## 2011-05-20 NOTE — ED Notes (Signed)
Pt in room no c/o nausea, abd pain or vomiting after drinking 12oz sprite zero.

## 2011-05-21 MED ORDER — HYDROCODONE-ACETAMINOPHEN 5-325 MG PO TABS: 1.0000 | ORAL_TABLET | ORAL | Status: DC | PRN

## 2011-05-21 MED ORDER — SULFAMETHOXAZOLE-TRIMETHOPRIM 800-160 MG PO TABS: 1.0000 | ORAL_TABLET | Freq: Two times a day (BID) | ORAL | Status: DC

## 2011-05-21 MED ORDER — PROMETHAZINE HCL 25 MG RE SUPP: 25.0000 mg | Freq: Four times a day (QID) | RECTAL | Status: DC | PRN

## 2011-05-21 NOTE — ED Provider Notes (Signed)
Medical screening examination/treatment/procedure(s) were performed by non-physician practitioner and as supervising physician I was immediately available for consultation/collaboration.  Richarda Blade, MD 05/21/11 [phone]

## 2011-05-21 NOTE — Discharge Instructions (Signed)
FOLLOW UP WITH DR. Jeanie Cooks NEXT WEEK. PUSH FLUIDS IN SMALL AMOUNTS. TAKE MEDICATIONS AS PRESCRIBED. RETURN HERE AS NEEDED FOR SEVERE PAIN, UNCONTROLLED VOMITING OR NEW CONCERN. RECOMMEND METAMUCIL TO RELIEVE CONSTIPATION.  Pyelonephritis, Adult Pyelonephritis is a kidney infection. A kidney infection can happen quickly, or it can last for a long time. HOME CARE   Take your medicine (antibiotics) as told. Finish it even if you start to feel better.   Keep all doctor visits as told.   Drink enough fluids to keep your pee (urine) clear or pale yellow.   Only take medicine as told by your doctor.  GET HELP RIGHT AWAY IF:   You have a fever.   You cannot take your medicine or drink fluids as told.   You have chills and shaking.   You feel very weak or pass out (faint).   You do not feel better after 2 days.  MAKE SURE YOU:  Understand these instructions.   Will watch your condition.   Will get help right away if you are not doing well or get worse.  Document Released: 02/13/2004 Document Revised: 12/25/2010 Document Reviewed: 06/25/2010 [facility] Patient Information 2012 [place].

## 2011-05-21 NOTE — ED Provider Notes (Signed)
6:30 Pm Assumed care of patient in the CDU.  Patient is currently on Pyelonephritis protocol.  She comes in with a chief complaint of abdominal pain, nausea, and vomiting over the past 2 days.  She is s/p hernia repair on 4/16.  Her UA here was positive for UTI.  Urine culture has been sent.  Therefore, Dr. Geoffry Paradise placed her on Cellulitis protocol.  Patient will be given Ceftriaxone IV and IVF.  She has also been given IV antiemetics for nausea.  Plan is for patient to stay in the CDU overnight on Pyelonephritis protocol and to be discharged in the morning.  Dr. Geoffry Paradise recommends discharging the patient on Miralax three times a day and Keflex for the UTI.  Reassessed patient.  Patient reports that her pain and nausea are becoming worse at this time.  Will order more pain medication and more Zofran.  VSS, Alert and orientated x 3, Heart RRR, Lungs CTAB, Abdomen:  Small surgical scars, no surrounding erythema or drainage, bowel sounds present in all 4 quadrants, mild diffuse tenderness to palpation.  10:00 PM Reassessed patient.  She reports that her pain is comfortable at this time.  No acute distress.  VSS.  Sherlyn Lees Manson, [name] 05/21/11 [phone]

## 2011-05-21 NOTE — ED Notes (Signed)
First meeting with patient. Patient waiting discharge. Patient resting with NAD at this time.

## 2011-05-21 NOTE — ED Provider Notes (Signed)
Tolerating PO's - reports other than persistent flank pain she is feeling better. Daughter to pick her up - will discharge home with instructions for follow up with Dr. Jeanie Cooks.  Leotis Shames, [name] 05/21/11 [phone]

## 2011-05-22 LAB — URINE CULTURE
Colony Count: >=100000
Culture  Setup Time: 201305011851

## 2011-05-23 NOTE — ED Provider Notes (Signed)
Medical screening examination/treatment/procedure(s) were conducted as a shared visit with non-physician practitioner(s) and myself.  I personally evaluated the patient during the encounter shared.  See my initial note for full H and P  Chauncy Passy, MD 05/23/11 [phone]

## 2011-05-23 NOTE — ED Notes (Signed)
+   Urine. Patient treated with Septra DS. Resistant. Chart sent to [place] office for review.

## 2011-05-24 NOTE — ED Notes (Signed)
Notified patient of new prescription. Prescription called in to [facility] on [address]. [phone]). RX called in by Georgina Peer PFM.

## 2011-05-24 NOTE — ED Notes (Signed)
Chart returned from [place] office. Prescribed MAcrobid 100 (#14). 1 PO BID. Prescribed by Irena Cords [name].

## 2011-05-27 ENCOUNTER — Encounter (HOSPITAL_COMMUNITY): Payer: Self-pay | Admitting: *Deleted

## 2011-05-27 ENCOUNTER — Emergency Department (HOSPITAL_COMMUNITY)
Admission: EM | Admit: 2011-05-27 | Discharge: 2011-05-27 | Disposition: A | Discharge status: Home / Self Care | Payer: PRIVATE HEALTH INSURANCE | Attending: Emergency Medicine | Admitting: Emergency Medicine

## 2011-05-27 ENCOUNTER — Emergency Department (HOSPITAL_COMMUNITY): Payer: PRIVATE HEALTH INSURANCE

## 2011-05-27 ENCOUNTER — Telehealth (INDEPENDENT_AMBULATORY_CARE_PROVIDER_SITE_OTHER): Payer: Self-pay | Admitting: General Surgery

## 2011-05-27 DIAGNOSIS — R112 Nausea with vomiting, unspecified: Secondary | ICD-10-CM | POA: Insufficient documentation

## 2011-05-27 DIAGNOSIS — K219 Gastro-esophageal reflux disease without esophagitis: Secondary | ICD-10-CM | POA: Insufficient documentation

## 2011-05-27 DIAGNOSIS — J449 Chronic obstructive pulmonary disease, unspecified: Secondary | ICD-10-CM | POA: Insufficient documentation

## 2011-05-27 DIAGNOSIS — R1084 Generalized abdominal pain: Secondary | ICD-10-CM | POA: Insufficient documentation

## 2011-05-27 DIAGNOSIS — M129 Arthropathy, unspecified: Secondary | ICD-10-CM | POA: Insufficient documentation

## 2011-05-27 DIAGNOSIS — I252 Old myocardial infarction: Secondary | ICD-10-CM | POA: Insufficient documentation

## 2011-05-27 DIAGNOSIS — I10 Essential (primary) hypertension: Secondary | ICD-10-CM | POA: Insufficient documentation

## 2011-05-27 DIAGNOSIS — H409 Unspecified glaucoma: Secondary | ICD-10-CM | POA: Insufficient documentation

## 2011-05-27 DIAGNOSIS — Z86718 Personal history of other venous thrombosis and embolism: Secondary | ICD-10-CM | POA: Insufficient documentation

## 2011-05-27 DIAGNOSIS — Z794 Long term (current) use of insulin: Secondary | ICD-10-CM | POA: Insufficient documentation

## 2011-05-27 DIAGNOSIS — Z8673 Personal history of transient ischemic attack (TIA), and cerebral infarction without residual deficits: Secondary | ICD-10-CM | POA: Insufficient documentation

## 2011-05-27 DIAGNOSIS — E119 Type 2 diabetes mellitus without complications: Secondary | ICD-10-CM | POA: Insufficient documentation

## 2011-05-27 DIAGNOSIS — Z79899 Other long term (current) drug therapy: Secondary | ICD-10-CM | POA: Insufficient documentation

## 2011-05-27 DIAGNOSIS — J4489 Other specified chronic obstructive pulmonary disease: Secondary | ICD-10-CM | POA: Insufficient documentation

## 2011-05-27 DIAGNOSIS — G40909 Epilepsy, unspecified, not intractable, without status epilepticus: Secondary | ICD-10-CM | POA: Insufficient documentation

## 2011-05-27 LAB — URINE MICROSCOPIC-ADD ON

## 2011-05-27 LAB — COMPREHENSIVE METABOLIC PANEL
ALT: 16 U/L (ref 0–35)
AST: 12 U/L (ref 0–37)
Albumin: 3.3 g/dL — ABNORMAL LOW (ref 3.5–5.2)
Alkaline Phosphatase: 77 U/L (ref 39–117)
BUN: 7 mg/dL (ref 6–23)
CO2: 25 mEq/L (ref 19–32)
Calcium: 9.1 mg/dL (ref 8.4–10.5)
Chloride: 102 mEq/L (ref 96–112)
Creatinine, Ser: 0.64 mg/dL (ref 0.50–1.10)
GFR calc Af Amer: >90 mL/min (ref >90)
GFR calc non Af Amer: >90 mL/min (ref >90)
Glucose, Bld: 170 mg/dL — ABNORMAL HIGH (ref 70–99)
Potassium: 3.8 mEq/L (ref 3.5–5.1)
Sodium: 136 mEq/L (ref 135–145)
Total Bilirubin: 0.2 mg/dL — ABNORMAL LOW (ref 0.3–1.2)
Total Protein: 6.9 g/dL (ref 6.0–8.3)

## 2011-05-27 LAB — URINALYSIS, ROUTINE W REFLEX MICROSCOPIC
Bilirubin Urine: NEGATIVE
Glucose, UA: NEGATIVE mg/dL
Ketones, ur: NEGATIVE mg/dL
Nitrite: NEGATIVE
Protein, ur: NEGATIVE mg/dL
Specific Gravity, Urine: >1.03 — ABNORMAL HIGH (ref 1.005–1.030)
Urobilinogen, UA: 1 mg/dL (ref 0.0–1.0)
pH: 6 (ref 5.0–8.0)

## 2011-05-27 LAB — DIFFERENTIAL
Basophils Absolute: 0 10*3/uL (ref 0.0–0.1)
Basophils Relative: 0 % (ref 0–1)
Eosinophils Absolute: 0 10*3/uL (ref 0.0–0.7)
Eosinophils Relative: 0 % (ref 0–5)
Lymphocytes Relative: 27 % (ref 12–46)
Lymphs Abs: 1.4 10*3/uL (ref 0.7–4.0)
Monocytes Absolute: 0.6 10*3/uL (ref 0.1–1.0)
Monocytes Relative: 12 % (ref 3–12)
Neutro Abs: 3.1 10*3/uL (ref 1.7–7.7)
Neutrophils Relative %: 61 % (ref 43–77)

## 2011-05-27 LAB — CBC
HCT: 34.8 % — ABNORMAL LOW (ref 36.0–46.0)
Hemoglobin: 11.2 g/dL — ABNORMAL LOW (ref 12.0–15.0)
MCH: 28.7 pg (ref 26.0–34.0)
MCHC: 32.2 g/dL (ref 30.0–36.0)
MCV: 89.2 fL (ref 78.0–100.0)
Platelets: 365 10*3/uL (ref 150–400)
RBC: 3.9 MIL/uL (ref 3.87–5.11)
RDW: 14.7 % (ref 11.5–15.5)
WBC: 5.1 10*3/uL (ref 4.0–10.5)

## 2011-05-27 LAB — PREGNANCY, URINE: Preg Test, Ur: NEGATIVE

## 2011-05-27 LAB — LIPASE, BLOOD: Lipase: 42 U/L (ref 11–59)

## 2011-05-27 MED ORDER — SODIUM CHLORIDE 0.9 % IV SOLN
1000.0000 mL | Freq: Once | INTRAVENOUS | Status: AC
  Administered 2011-05-27: 1000 mL via INTRAVENOUS

## 2011-05-27 MED ORDER — ONDANSETRON HCL 4 MG/2ML IJ SOLN
4.0000 mg | Freq: Once | INTRAMUSCULAR | Status: AC
  Administered 2011-05-27: 4 mg via INTRAVENOUS
  Filled 2011-05-27: qty 2

## 2011-05-27 MED ORDER — HYDROMORPHONE HCL PF 1 MG/ML IJ SOLN
1.0000 mg | Freq: Once | INTRAMUSCULAR | Status: AC
  Administered 2011-05-27: 1 mg via INTRAVENOUS
  Filled 2011-05-27: qty 1

## 2011-05-27 MED ORDER — ONDANSETRON 8 MG PO TBDP: 8.0000 mg | ORAL_TABLET | Freq: Three times a day (TID) | ORAL | Status: AC | PRN

## 2011-05-27 MED ORDER — SODIUM CHLORIDE 0.9 % IV BOLUS (SEPSIS)
1000.0000 mL | Freq: Once | INTRAVENOUS | Status: AC
  Administered 2011-05-27: 1000 mL via INTRAVENOUS

## 2011-05-27 MED ORDER — SODIUM CHLORIDE 0.9 % IV SOLN
1000.0000 mL | INTRAVENOUS | Status: DC
  Administered 2011-05-27: 1000 mL via INTRAVENOUS

## 2011-05-27 MED ORDER — HYDROCODONE-ACETAMINOPHEN 5-325 MG PO TABS: 1.0000 | ORAL_TABLET | Freq: Four times a day (QID) | ORAL | Status: AC | PRN

## 2011-05-27 MED ORDER — IOHEXOL 300 MG/ML  SOLN
100.0000 mL | Freq: Once | INTRAMUSCULAR | Status: AC | PRN
  Administered 2011-05-27: 100 mL via INTRAVENOUS

## 2011-05-27 NOTE — Telephone Encounter (Signed)
Pt calling out of frustration with nausea and inability to hold anything down.  She is recovering from incisional hernia repair on 05/05/11, has bronchitis and a UTI.  She is using a phenergan 25 mg suppository 15-30 min before eating.  Advised to sip on broth or gingerale, nibble very small amounts of starchy foods while avoiding all proteins until stomach is settled.  Gatorade okay.  Continue to monitor blood sugars and use sliding scale appropriately.  Stay in contact with PCP as well.  Offered emotional support.

## 2011-05-27 NOTE — ED Notes (Signed)
IV team paged to start IV.

## 2011-05-27 NOTE — ED Notes (Signed)
Reports having n/v since hernia surgery last month, was admitted on 5/1 for the vomiting. No acute distress noted at triage.

## 2011-05-27 NOTE — ED Provider Notes (Signed)
History     CSN: [id number]  Arrival date & time 05/27/11  1403   First MD Initiated Contact with Patient 05/27/11 1545      Chief Complaint  Patient presents with  . Emesis     HPI Comments: Pt states it never went away since she had surgery for hernia repair.  Pt has followed up with her surgeon and her PCP.  Pt states she has not been getting better.  She called the surgeon today and was told to come to the ED.  Pt states she has vomited twice today.   Pt has also been seen and diagnosed with a uti.  She was last seen in the ED on 5/2 for this problem.  Pt states she still is not feeling better.  Her surgery was on April 16th and was then discharged on the 29th.  Pt states she has had trouble with kidney infections that required hospitalization.  She has not been able to keep anything down.  Patient is a 46 y.o. female presenting with vomiting. The history is provided by the patient.  Emesis  This is a new problem. Episode onset: since last month. The problem occurs 2 to 4 times per day. The problem has not changed since onset.The emesis has an appearance of stomach contents. There has been no fever. Associated symptoms include abdominal pain. Pertinent negatives include no chills, no diarrhea and no headaches.    Past Medical History  Diagnosis Date  . Umbilical hernia   . Hypertension   . Asthma   . Seizures   . Stroke     last 2003  . Heart murmur     born with   . Dysrhythmia     skipped beats  . Myocardial infarction     hi of x 2  last one 2003   . COPD (chronic obstructive pulmonary disease)     chronic bronchitis   . Pneumonia     hx of 2009  . GERD (gastroesophageal reflux disease)   . Nephrolithiasis     frequent urination , urination at nite   . Glaucoma   . Legally blind     left eye   . Headache     migraines   . Neuropathy     due to diabetes   . Dizziness     secondary to diabetes and hypertension   . Arthritis     joint pain   . Hx of blood clots      hematomas removed from left side of brain from 7mo to 89yrold   . Epilepsy idiopathic petit mal     last seizure 2012;controlled w/ topomax  . Diabetic neuropathy, painful   . Diabetes mellitus     since age [age]type 2 IDDM  . Sleep apnea     [facility];does not use Cpap ; mild  . CVA 11/14/2007    Qualifier: Diagnosis of  By: MaHassell DoneNP, NyTori Milks  . MIGRAINE HEADACHE 09/14/2006    Qualifier: Diagnosis of  By: MaHassell DoneNP, NyTori Milks    Past Surgical History  Procedure Date  . Kidney stone removal   . Brain hematoma evacutation     five procedures total, first procedure when [age]onths old, last at [age]ears of age.  . Shunt removal     shunt inserted at age 25 14emoved at age 46 . Eye surgery   . Retinal detachment surgery 1990  .  Vaginal hysterectomy 1996  . Umbilical hernia repair 3/53/6144  . Incisional hernia repair 05/05/2011    Procedure: LAPAROSCOPIC INCISIONAL HERNIA;  Surgeon: Rolm Bookbinder, MD;  Location: [facility];  Service: General;  Laterality: N/A;  . Mass excision 05/05/2011    Procedure: EXCISION MASS;  Surgeon: Rolm Bookbinder, MD;  Location: [facility];  Service: General;  Laterality: Right;  . Abdominal surgery     Family History  Problem Relation Age of Onset  . Cancer Mother     uterine  . Cancer Maternal Grandmother     brain  . Anesthesia problems Neg Hx     History  Substance Use Topics  . Smoking status: Former Smoker -- 1.0 packs/day for 30 years    Types: Cigarettes    Quit date: 01/20/1987  . Smokeless tobacco: Never Used  . Alcohol Use: No    OB History    Grav Para Term Preterm Abortions TAB SAB Ect Mult Living                  Review of Systems  Constitutional: Negative for chills.  Gastrointestinal: Positive for vomiting and abdominal pain. Negative for diarrhea.  Neurological: Negative for headaches.  All other systems reviewed and are negative.    Allergies  Codeine  Home Medications   Current  Outpatient Rx  Name Route Sig Dispense Refill  . ALBUTEROL SULFATE HFA 108 (90 BASE) MCG/ACT IN AERS Inhalation Inhale 2 puffs into the lungs every 6 (six) hours as needed. Wheezing     . BACLOFEN 10 MG PO TABS Oral Take 10 mg by mouth 3 (three) times daily.    . BUDESONIDE-FORMOTEROL FUMARATE 160-4.5 MCG/ACT IN AERO Inhalation Inhale 2 puffs into the lungs 2 (two) times daily.    [name] GABAPENTIN 300 MG PO CAPS Oral Take 300 mg by mouth 3 (three) times daily.    [name] GLIPIZIDE ER 10 MG PO TB24 Oral Take 20 mg by mouth daily.     . INSULIN ASPART 100 UNIT/ML [place] SOLN Subcutaneous Inject 2-14 Units into the skin 4 (four) times daily. Sliding scale    . INSULIN DETEMIR 100 UNIT/ML [place] SOLN Subcutaneous Inject 30-40 Units into the skin 2 (two) times daily. 30 am 40 pm    . PROMETHAZINE HCL 25 MG RE SUPP Rectal Place 1 suppository (25 mg total) rectally every 6 (six) hours as needed for nausea. 12 each 0  . RANITIDINE HCL 150 MG PO TABS Oral Take 150 mg by mouth 2 (two) times daily.    [name] SIMVASTATIN 20 MG PO TABS Oral Take 20 mg by mouth every evening.    . TOPIRAMATE 200 MG PO TABS Oral Take 200 mg by mouth 2 (two) times daily.    . TRAZODONE HCL 150 MG PO TABS Oral Take 150 mg by mouth at bedtime.      BP 126/88  Pulse 95  Temp(Src) 97.9 F (36.6 C) (Oral)  Resp 18  SpO2 98%  Physical Exam  Nursing note and vitals reviewed. Constitutional: She appears well-developed and well-nourished. No distress.  HENT:  Head: Normocephalic and atraumatic.  Right Ear: External ear normal.  Left Ear: External ear normal.  Eyes: Conjunctivae are normal. Right eye exhibits no discharge. No scleral icterus.  Neck: Neck supple. No tracheal deviation present.  Cardiovascular: Normal rate, regular rhythm and intact distal pulses.   Pulmonary/Chest: Effort normal and breath sounds normal. No stridor. No respiratory distress. She has no wheezes. She has no rales.  Abdominal: Soft. She exhibits no distension. Bowel  sounds are decreased. There is generalized tenderness. There is no rebound and no guarding. No hernia.       Scars without e/e  Musculoskeletal: She exhibits no edema and no tenderness.  Neurological: She is alert. She has normal strength. No sensory deficit. Cranial nerve deficit:  no gross defecits noted. She exhibits normal muscle tone. She displays no seizure activity. Coordination normal.  Skin: Skin is warm and dry. No rash noted.  Psychiatric: She has a normal mood and affect.    ED Course  Procedures (including critical care time)  Labs Reviewed  CBC - Abnormal; Notable for the following:    Hemoglobin 11.2 (*)    HCT 34.8 (*)    All other components within normal limits  COMPREHENSIVE METABOLIC PANEL - Abnormal; Notable for the following:    Glucose, Bld 170 (*)    Albumin 3.3 (*)    Total Bilirubin 0.2 (*)    All other components within normal limits  URINALYSIS, ROUTINE W REFLEX MICROSCOPIC - Abnormal; Notable for the following:    Specific Gravity, Urine >1.030 (*)    Hgb urine dipstick TRACE (*)    Leukocytes, UA TRACE (*)    All other components within normal limits  URINE MICROSCOPIC-ADD ON - Abnormal; Notable for the following:    Squamous Epithelial / LPF FEW (*)    Crystals CA OXALATE CRYSTALS (*)    All other components within normal limits  DIFFERENTIAL  LIPASE, BLOOD  PREGNANCY, URINE   Ct Abdomen Pelvis W Contrast  05/27/2011  *RADIOLOGY REPORT*  Clinical Data: Nausea, vomiting, recent hernia surgery.  CT ABDOMEN AND PELVIS WITH CONTRAST  Technique:  Multidetector CT imaging of the abdomen and pelvis was performed following the standard protocol during bolus administration of intravenous contrast.  Contrast: 158m OMNIPAQUE IOHEXOL 300 MG/ML  SOLN  Comparison: 05/12/2011  Findings: Minimal dependent atelectasis in the visualized lung bases.  Unremarkable liver, gallbladder, spleen, left adrenal gland, pancreas.  Stable 2 cm low attenuation right adrenal nodule.  Bilateral nephrolithiasis, largest stone on the right 3 mm in the lower pole, on the left 5 mm in the upper pole.  No hydronephrosis or ureteral calculus.  Urinary bladder is incompletely distended, unremarkable.  There has been some decrease in the amount of fluid adjacent to the umbilical hernia mesh, largest pocket measuring 20 x 54 mm maximal transverse dimensions.  No new fluid collections are identified. No ascites.  Stomach and small bowel are nondilated.  Normal appendix.  Colon is nondilated.  No adenopathy localized.  Previous hysterectomy.  IMPRESSION: 1.  Slight interval decrease in loculated fluid adjacent to the hernia mesh. 2.  Bilateral nephrolithiasis without hydronephrosis or ureteral calculus.  Original Report Authenticated By: DDillard CannonIII, M.D.     1. Nausea and vomiting       MDM  Patient is no evidence of bowel obstruction or CT scan. She does continue to have a fluid collection adjacent to the hernia mesh but that has been noted on prior CT scans and this appears to be decreasing in size.  I doubt that this is a abscess fluid collection. Patient does not appear dehydrated. She is responded to IV fluids and has not had persistent vomiting. Is possible she could have a component of gastroparesis considering her diabetes. At this time there does not appear to be evidence of an acute emergency medical condition. Patient discharged home with medications for pain and nausea.  I encouraged her to followup with her general surgeon and primary doctor.        Kathalene Frames, MD 05/27/11 [phone]

## 2011-05-27 NOTE — Discharge Instructions (Signed)
Nausea and Vomiting  Nausea is a sick feeling that often comes before throwing up (vomiting). Vomiting is a reflex where stomach contents come out of your mouth. Vomiting can cause severe loss of body fluids (dehydration). Children and elderly adults can become dehydrated quickly, especially if they also have diarrhea. Nausea and vomiting are symptoms of a condition or disease. It is important to find the cause of your symptoms.  CAUSES    Direct irritation of the stomach lining. This irritation can result from increased acid production (gastroesophageal reflux disease), infection, food poisoning, taking certain medicines (such as nonsteroidal anti-inflammatory drugs), alcohol use, or tobacco use.   Signals from the brain.These signals could be caused by a headache, heat exposure, an inner ear disturbance, increased pressure in the brain from injury, infection, a tumor, or a concussion, pain, emotional stimulus, or metabolic problems.   An obstruction in the gastrointestinal tract (bowel obstruction).   Illnesses such as diabetes, hepatitis, gallbladder problems, appendicitis, kidney problems, cancer, sepsis, atypical symptoms of a heart attack, or eating disorders.   Medical treatments such as chemotherapy and radiation.   Receiving medicine that makes you sleep (general anesthetic) during surgery.  DIAGNOSIS  Your caregiver may ask for tests to be done if the problems do not improve after a few days. Tests may also be done if symptoms are severe or if the reason for the nausea and vomiting is not clear. Tests may include:   Urine tests.   Blood tests.   Stool tests.   Cultures (to look for evidence of infection).   X-rays or other imaging studies.  Test results can help your caregiver make decisions about treatment or the need for additional tests.  TREATMENT  You need to stay well hydrated. Drink frequently but in small amounts.You may wish to drink water, sports drinks, clear broth, or eat frozen  ice pops or gelatin dessert to help stay hydrated.When you eat, eating slowly may help prevent nausea.There are also some antinausea medicines that may help prevent nausea.  HOME CARE INSTRUCTIONS    Take all medicine as directed by your caregiver.   If you do not have an appetite, do not force yourself to eat. However, you must continue to drink fluids.   If you have an appetite, eat a normal diet unless your caregiver tells you differently.   Eat a variety of complex carbohydrates (rice, wheat, potatoes, bread), lean meats, yogurt, fruits, and vegetables.   Avoid high-fat foods because they are more difficult to digest.   Drink enough water and fluids to keep your urine clear or pale yellow.   If you are dehydrated, ask your caregiver for specific rehydration instructions. Signs of dehydration may include:   Severe thirst.   Dry lips and mouth.   Dizziness.   Dark urine.   Decreasing urine frequency and amount.   Confusion.   Rapid breathing or pulse.  SEEK IMMEDIATE MEDICAL CARE IF:    You have blood or brown flecks (like coffee grounds) in your vomit.   You have black or bloody stools.   You have a severe headache or stiff neck.   You are confused.   You have severe abdominal pain.   You have chest pain or trouble breathing.   You do not urinate at least once every 8 hours.   You develop cold or clammy skin.   You continue to vomit for longer than 24 to 48 hours.   You have a fever.  MAKE SURE YOU:      Understand these instructions.   Will watch your condition.   Will get help right away if you are not doing well or get worse.  Document Released: 01/05/2005 Document Revised: 12/25/2010 Document Reviewed: 06/04/2010  ExitCare Patient Information 2012 [employer].

## 2011-05-27 NOTE — ED Notes (Signed)
IV team returned call and will be here shortly to start IV.

## 2011-06-02 ENCOUNTER — Telehealth (INDEPENDENT_AMBULATORY_CARE_PROVIDER_SITE_OTHER): Payer: Self-pay | Admitting: General Surgery

## 2011-06-02 NOTE — Telephone Encounter (Signed)
Message copied by Margarette Asal on Tue Jun 02, 2011  9:48 AM ------      Message from: [place], [place]      Created: Tue Jun 02, 2011  9:34 AM       Would you call and make sure she is ok?      ----- Message -----         From: Margarette Asal, CMA         Sent: 06/02/2011   8:29 AM           To: Rolm Bookbinder, MD            It does not look like she has an appt. I will let Lars Mage know she needs one.       ----- Message -----         From: Rolm Bookbinder, MD         Sent: 06/02/2011   4:33 AM           To: Illene Regulus, CMA            Alisha,      Does she have a follow up?        MW

## 2011-06-02 NOTE — Telephone Encounter (Signed)
Spoke with patient. She is feeling ok. States she is "getting there." Still on mainly liquid diet. Having some nausea but taking phenergan and it helps. Moving bowels. No fevers. Appt made for 06/12/11.

## 2011-06-05 NOTE — Discharge Summary (Signed)
Physician Discharge Summary  Patient ID: [name] MRN: [id number] DOB/AGE: Apr 14, 1965 46 y.o.  Admit date: 05/05/2011 Discharge date: 06/05/2011  Admission Diagnoses: Incisional hernia See below  Discharge Diagnoses:  Principal Problem:  *Incisional hernia Active Problems:  DIABETES MELLITUS, TYPE II  HYPERLIPIDEMIA  DEPRESSION  HYPERTENSION  GERD  SEIZURE DISORDER  Tachycardia  Obesity, Class III, BMI 40-49.9 (morbid obesity)   Discharged Condition: fair  Hospital Course: This is a 46 year old female with multiple medical problems including morbid obesity and diabetes. She had a symptomatic incisional hernia as well as 2 small masses on her right knee. She was taken the operating room and underwent a laparoscopic incisional hernia repair with mesh. I also excised these 2 small knee masses. Postoperatively she had a prolonged ileus requiring nasogastric tube. She was ambulating and eventually we were able to advance her diet as her bowel function returned. Her incisions remained clean during this time. Her diabetes remained under fairly good control while she was an inpatient.  Consults: None  Significant Diagnostic Studies: none  Treatments: surgery: lap ventral hernia repair with mesh, excision of knee masses   Disposition: 01-Home or Self Care  Discharge Orders    Future Appointments: Provider: Department: Dept Phone: Center:   06/12/2011 10:30 AM Rolm Bookbinder, MD [facility] [phone] None     Medication List  As of 06/05/2011  8:29 AM   STOP taking these medications         losartan 50 MG tablet      predniSONE 20 MG tablet         TAKE these medications         albuterol 108 (90 BASE) MCG/ACT inhaler   Commonly known as: PROVENTIL HFA;VENTOLIN HFA   Inhale 2 puffs into the lungs every 6 (six) hours as needed. Wheezing        baclofen 10 MG tablet   Commonly known as: LIORESAL   Take 10 mg by mouth 3 (three) times daily.     budesonide-formoterol 160-4.5 MCG/ACT inhaler   Commonly known as: SYMBICORT   Inhale 2 puffs into the lungs 2 (two) times daily.      gabapentin 300 MG capsule   Commonly known as: NEURONTIN   Take 300 mg by mouth 3 (three) times daily.      glipiZIDE 10 MG 24 hr tablet   Commonly known as: GLUCOTROL XL   Take 20 mg by mouth daily.      insulin aspart 100 UNIT/ML injection   Commonly known as: novoLOG   Inject 2-14 Units into the skin 4 (four) times daily. Sliding scale      insulin detemir 100 UNIT/ML injection   Commonly known as: LEVEMIR   Inject 30-40 Units into the skin 2 (two) times daily. 30 am 40 pm      ranitidine 150 MG tablet   Commonly known as: ZANTAC   Take 150 mg by mouth 2 (two) times daily.      simvastatin 20 MG tablet   Commonly known as: ZOCOR   Take 20 mg by mouth every evening.      topiramate 200 MG tablet   Commonly known as: TOPAMAX   Take 200 mg by mouth 2 (two) times daily.           Follow-up Information    Follow up with [facility], [place]. Schedule an appointment as soon as possible for a visit in 2 weeks.   Contact information:   [facility]  [place]          Signed: Rolm Bookbinder 06/05/2011, 8:29 AM

## 2011-06-12 ENCOUNTER — Encounter (INDEPENDENT_AMBULATORY_CARE_PROVIDER_SITE_OTHER): Payer: Self-pay | Admitting: General Surgery

## 2011-06-12 ENCOUNTER — Ambulatory Visit (INDEPENDENT_AMBULATORY_CARE_PROVIDER_SITE_OTHER): Payer: PRIVATE HEALTH INSURANCE | Admitting: General Surgery

## 2011-06-12 VITALS — BP 134/100 | HR 89 | Temp 98.1°F | Ht 67.0 in | Wt 240.2 lb

## 2011-06-12 DIAGNOSIS — Z09 Encounter for follow-up examination after completed treatment for conditions other than malignant neoplasm: Secondary | ICD-10-CM

## 2011-06-12 NOTE — Progress Notes (Signed)
Subjective:     Patient ID: [name], female   DOB: 08/18/1965, 46 y.o.   MRN: [id number]  HPI This is a 46 year old female with multiple medical problems that I repaired a incisional hernia at the umbilicus laparoscopically with mesh. I also removed 2 small masses on the right knee. She remained in the hospital for 2 weeks following surgery. She says she was discharged home and is now doing fairly well. She is eating a regular diet and having bowel movements. She does still have nausea which I think may very well be related to her underlying disease. She reports some back pain as well as some mild abdominal pain but this is all getting better sounds like.  Review of Systems     Objective:   Physical Exam Well healed lap incisions, small seroma at hernia site, nontender    Assessment:     S/p lvh    Plan:     She is doing well now after hernia repair.  I think a lot of her symptoms are from her underlying comorbidities.  She is released to full activity and I can see her back as needed.

## 2011-08-21 ENCOUNTER — Inpatient Hospital Stay (HOSPITAL_COMMUNITY)
Admission: EM | Admit: 2011-08-21 | Discharge: 2011-08-26 | DRG: 690 | Disposition: A | Discharge status: Home / Self Care | Payer: PRIVATE HEALTH INSURANCE | Attending: Family Medicine | Admitting: Family Medicine

## 2011-08-21 ENCOUNTER — Encounter (HOSPITAL_COMMUNITY): Payer: Self-pay | Admitting: Emergency Medicine

## 2011-08-21 DIAGNOSIS — N200 Calculus of kidney: Secondary | ICD-10-CM | POA: Diagnosis present

## 2011-08-21 DIAGNOSIS — N1330 Unspecified hydronephrosis: Secondary | ICD-10-CM | POA: Diagnosis present

## 2011-08-21 DIAGNOSIS — I10 Essential (primary) hypertension: Secondary | ICD-10-CM | POA: Diagnosis present

## 2011-08-21 DIAGNOSIS — E1142 Type 2 diabetes mellitus with diabetic polyneuropathy: Secondary | ICD-10-CM | POA: Diagnosis present

## 2011-08-21 DIAGNOSIS — E1149 Type 2 diabetes mellitus with other diabetic neurological complication: Secondary | ICD-10-CM | POA: Diagnosis present

## 2011-08-21 DIAGNOSIS — N10 Acute tubulo-interstitial nephritis: Principal | ICD-10-CM | POA: Diagnosis present

## 2011-08-21 DIAGNOSIS — N135 Crossing vessel and stricture of ureter without hydronephrosis: Secondary | ICD-10-CM | POA: Diagnosis present

## 2011-08-21 DIAGNOSIS — E119 Type 2 diabetes mellitus without complications: Secondary | ICD-10-CM | POA: Diagnosis present

## 2011-08-21 DIAGNOSIS — I252 Old myocardial infarction: Secondary | ICD-10-CM

## 2011-08-21 DIAGNOSIS — E669 Obesity, unspecified: Secondary | ICD-10-CM | POA: Diagnosis present

## 2011-08-21 DIAGNOSIS — Z8673 Personal history of transient ischemic attack (TIA), and cerebral infarction without residual deficits: Secondary | ICD-10-CM

## 2011-08-21 DIAGNOSIS — N3946 Mixed incontinence: Secondary | ICD-10-CM | POA: Diagnosis present

## 2011-08-21 DIAGNOSIS — F329 Major depressive disorder, single episode, unspecified: Secondary | ICD-10-CM

## 2011-08-21 DIAGNOSIS — N12 Tubulo-interstitial nephritis, not specified as acute or chronic: Secondary | ICD-10-CM | POA: Diagnosis present

## 2011-08-21 DIAGNOSIS — R310 Gross hematuria: Secondary | ICD-10-CM | POA: Diagnosis present

## 2011-08-21 DIAGNOSIS — G40909 Epilepsy, unspecified, not intractable, without status epilepticus: Secondary | ICD-10-CM | POA: Diagnosis present

## 2011-08-21 DIAGNOSIS — E785 Hyperlipidemia, unspecified: Secondary | ICD-10-CM

## 2011-08-21 LAB — URINE MICROSCOPIC-ADD ON

## 2011-08-21 LAB — URINALYSIS, ROUTINE W REFLEX MICROSCOPIC
Glucose, UA: >1000 mg/dL — AB
Ketones, ur: 15 mg/dL — AB
Nitrite: POSITIVE — AB
Protein, ur: 100 mg/dL — AB
Specific Gravity, Urine: 1.025 (ref 1.005–1.030)
Urobilinogen, UA: 1 mg/dL (ref 0.0–1.0)
pH: 6 (ref 5.0–8.0)

## 2011-08-21 LAB — CBC WITH DIFFERENTIAL/PLATELET
Basophils Absolute: 0 10*3/uL (ref 0.0–0.1)
Basophils Relative: 0 % (ref 0–1)
Eosinophils Absolute: 0 10*3/uL (ref 0.0–0.7)
Eosinophils Relative: 0 % (ref 0–5)
HCT: 36.4 % (ref 36.0–46.0)
Hemoglobin: 12 g/dL (ref 12.0–15.0)
Lymphocytes Relative: 10 % — ABNORMAL LOW (ref 12–46)
Lymphs Abs: 1.9 10*3/uL (ref 0.7–4.0)
MCH: 29.3 pg (ref 26.0–34.0)
MCHC: 33 g/dL (ref 30.0–36.0)
MCV: 88.8 fL (ref 78.0–100.0)
Monocytes Absolute: 1.7 10*3/uL — ABNORMAL HIGH (ref 0.1–1.0)
Monocytes Relative: 10 % (ref 3–12)
Neutro Abs: 14.3 10*3/uL — ABNORMAL HIGH (ref 1.7–7.7)
Neutrophils Relative %: 80 % — ABNORMAL HIGH (ref 43–77)
Platelets: 258 10*3/uL (ref 150–400)
RBC: 4.1 MIL/uL (ref 3.87–5.11)
RDW: 14.3 % (ref 11.5–15.5)
WBC: 17.9 10*3/uL — ABNORMAL HIGH (ref 4.0–10.5)

## 2011-08-21 LAB — BASIC METABOLIC PANEL
BUN: 8 mg/dL (ref 6–23)
CO2: 20 mEq/L (ref 19–32)
Calcium: 9.1 mg/dL (ref 8.4–10.5)
Chloride: 104 mEq/L (ref 96–112)
Creatinine, Ser: 0.79 mg/dL (ref 0.50–1.10)
GFR calc Af Amer: >90 mL/min (ref >90)
GFR calc non Af Amer: >90 mL/min (ref >90)
Glucose, Bld: 262 mg/dL — ABNORMAL HIGH (ref 70–99)
Potassium: 3.3 mEq/L — ABNORMAL LOW (ref 3.5–5.1)
Sodium: 137 mEq/L (ref 135–145)

## 2011-08-21 MED ORDER — MORPHINE SULFATE 4 MG/ML IJ SOLN
4.0000 mg | Freq: Once | INTRAMUSCULAR | Status: AC
  Administered 2011-08-22: 4 mg via INTRAVENOUS
  Filled 2011-08-21: qty 1

## 2011-08-21 MED ORDER — DEXTROSE 5 % IV SOLN
1.0000 g | Freq: Once | INTRAVENOUS | Status: AC
  Administered 2011-08-22: 1 g via INTRAVENOUS
  Filled 2011-08-21: qty 10

## 2011-08-21 MED ORDER — SODIUM CHLORIDE 0.9 % IV BOLUS (SEPSIS)
1000.0000 mL | Freq: Once | INTRAVENOUS | Status: AC
  Administered 2011-08-22: 1000 mL via INTRAVENOUS

## 2011-08-21 MED ORDER — ONDANSETRON HCL 4 MG/2ML IJ SOLN
4.0000 mg | Freq: Once | INTRAMUSCULAR | Status: AC
  Administered 2011-08-22: 4 mg via INTRAVENOUS
  Filled 2011-08-21: qty 2

## 2011-08-21 NOTE — ED Notes (Signed)
IV attempt x 3. Unsuccessful. IV team notified to assess

## 2011-08-21 NOTE — ED Notes (Signed)
Pt notified of lab results and place in line.

## 2011-08-21 NOTE — ED Notes (Signed)
IV team at bedside 

## 2011-08-21 NOTE — ED Notes (Signed)
Pt reports for past 2 days, having R flank pain and lower back pain, hematuria,; reports had similar symptoms 2 years ago --had kidney stones; took percocet 10/325 four hours ago

## 2011-08-21 NOTE — ED Notes (Signed)
Requisition sent to lab to add on urine culture to previously submitted specimen

## 2011-08-22 ENCOUNTER — Emergency Department (HOSPITAL_COMMUNITY): Payer: PRIVATE HEALTH INSURANCE

## 2011-08-22 ENCOUNTER — Encounter (HOSPITAL_COMMUNITY): Payer: Self-pay | Admitting: Internal Medicine

## 2011-08-22 DIAGNOSIS — N200 Calculus of kidney: Secondary | ICD-10-CM | POA: Diagnosis present

## 2011-08-22 DIAGNOSIS — N12 Tubulo-interstitial nephritis, not specified as acute or chronic: Secondary | ICD-10-CM | POA: Diagnosis present

## 2011-08-22 DIAGNOSIS — I10 Essential (primary) hypertension: Secondary | ICD-10-CM

## 2011-08-22 DIAGNOSIS — F329 Major depressive disorder, single episode, unspecified: Secondary | ICD-10-CM

## 2011-08-22 LAB — HEPATIC FUNCTION PANEL
ALT: 10 U/L (ref 0–35)
AST: 10 U/L (ref 0–37)
Albumin: 3.3 g/dL — ABNORMAL LOW (ref 3.5–5.2)
Alkaline Phosphatase: 104 U/L (ref 39–117)
Bilirubin, Direct: 0.1 mg/dL (ref 0.0–0.3)
Indirect Bilirubin: 0.3 mg/dL (ref 0.3–0.9)
Total Bilirubin: 0.4 mg/dL (ref 0.3–1.2)
Total Protein: 8 g/dL (ref 6.0–8.3)

## 2011-08-22 LAB — GLUCOSE, CAPILLARY
Glucose-Capillary: 223 mg/dL — ABNORMAL HIGH (ref 70–99)
Glucose-Capillary: 187 mg/dL — ABNORMAL HIGH (ref 70–99)
Glucose-Capillary: 180 mg/dL — ABNORMAL HIGH (ref 70–99)
Glucose-Capillary: 163 mg/dL — ABNORMAL HIGH (ref 70–99)
Glucose-Capillary: 143 mg/dL — ABNORMAL HIGH (ref 70–99)
Glucose-Capillary: 189 mg/dL — ABNORMAL HIGH (ref 70–99)

## 2011-08-22 LAB — TSH: TSH: 0.683 u[IU]/mL (ref 0.350–4.500)

## 2011-08-22 LAB — LACTIC ACID, PLASMA: Lactic Acid, Venous: 1.2 mmol/L (ref 0.5–2.2)

## 2011-08-22 LAB — HEMOGLOBIN A1C
Hgb A1c MFr Bld: 10 % — ABNORMAL HIGH (ref <5.7)
Mean Plasma Glucose: 240 mg/dL — ABNORMAL HIGH (ref <117)

## 2011-08-22 LAB — LIPASE, BLOOD: Lipase: 16 U/L (ref 11–59)

## 2011-08-22 MED ORDER — TRAZODONE HCL 150 MG PO TABS
150.0000 mg | ORAL_TABLET | Freq: Every day | ORAL | Status: DC
  Administered 2011-08-22 – 2011-08-25 (×4): 150 mg via ORAL
  Filled 2011-08-22 (×6): qty 1

## 2011-08-22 MED ORDER — ONDANSETRON HCL 4 MG/2ML IJ SOLN
4.0000 mg | Freq: Once | INTRAMUSCULAR | Status: AC
  Administered 2011-08-22: 4 mg via INTRAVENOUS
  Filled 2011-08-22: qty 2

## 2011-08-22 MED ORDER — GLIPIZIDE ER 10 MG PO TB24
20.0000 mg | ORAL_TABLET | Freq: Every day | ORAL | Status: DC
  Administered 2011-08-22 – 2011-08-26 (×5): 20 mg via ORAL
  Filled 2011-08-22 (×5): qty 2

## 2011-08-22 MED ORDER — INSULIN ASPART 100 UNIT/ML ~~LOC~~ SOLN
5.0000 [IU] | Freq: Once | SUBCUTANEOUS | Status: AC
  Administered 2011-08-22: 5 [IU] via SUBCUTANEOUS
  Filled 2011-08-22: qty 1

## 2011-08-22 MED ORDER — ONDANSETRON HCL 4 MG/2ML IJ SOLN: 4.0000 mg | Freq: Four times a day (QID) | INTRAMUSCULAR | Status: DC | PRN

## 2011-08-22 MED ORDER — SODIUM CHLORIDE 0.9 % IV BOLUS (SEPSIS)
1000.0000 mL | Freq: Once | INTRAVENOUS | Status: AC
  Administered 2011-08-22: 1000 mL via INTRAVENOUS

## 2011-08-22 MED ORDER — FAMOTIDINE 10 MG PO TABS
10.0000 mg | ORAL_TABLET | Freq: Every day | ORAL | Status: DC
  Administered 2011-08-22 – 2011-08-26 (×5): 10 mg via ORAL
  Filled 2011-08-22 (×5): qty 1

## 2011-08-22 MED ORDER — ACETAMINOPHEN 650 MG RE SUPP: 650.0000 mg | Freq: Four times a day (QID) | RECTAL | Status: DC | PRN

## 2011-08-22 MED ORDER — MORPHINE SULFATE 4 MG/ML IJ SOLN
4.0000 mg | Freq: Once | INTRAMUSCULAR | Status: AC
  Administered 2011-08-22: 4 mg via INTRAVENOUS
  Filled 2011-08-22: qty 1

## 2011-08-22 MED ORDER — ACETAMINOPHEN 325 MG PO TABS
650.0000 mg | ORAL_TABLET | Freq: Four times a day (QID) | ORAL | Status: DC | PRN
  Administered 2011-08-23: 650 mg via ORAL
  Filled 2011-08-22: qty 2

## 2011-08-22 MED ORDER — INSULIN DETEMIR 100 UNIT/ML ~~LOC~~ SOLN
40.0000 [IU] | Freq: Every day | SUBCUTANEOUS | Status: DC
  Administered 2011-08-23 – 2011-08-26 (×3): 40 [IU] via SUBCUTANEOUS

## 2011-08-22 MED ORDER — HYDROMORPHONE HCL PF 1 MG/ML IJ SOLN
1.0000 mg | Freq: Once | INTRAMUSCULAR | Status: AC
  Administered 2011-08-22: 1 mg via INTRAVENOUS
  Filled 2011-08-22: qty 1

## 2011-08-22 MED ORDER — ONDANSETRON HCL 4 MG PO TABS
4.0000 mg | ORAL_TABLET | Freq: Four times a day (QID) | ORAL | Status: DC | PRN
  Administered 2011-08-25: 4 mg via ORAL
  Filled 2011-08-22: qty 1

## 2011-08-22 MED ORDER — TAMSULOSIN HCL 0.4 MG PO CAPS
0.4000 mg | ORAL_CAPSULE | Freq: Every day | ORAL | Status: DC
  Administered 2011-08-22 – 2011-08-26 (×5): 0.4 mg via ORAL
  Filled 2011-08-22 (×6): qty 1

## 2011-08-22 MED ORDER — INSULIN ASPART 100 UNIT/ML ~~LOC~~ SOLN
0.0000 [IU] | Freq: Three times a day (TID) | SUBCUTANEOUS | Status: DC
  Administered 2011-08-22 (×2): 3 [IU] via SUBCUTANEOUS
  Administered 2011-08-22 – 2011-08-23 (×2): 2 [IU] via SUBCUTANEOUS
  Administered 2011-08-23 (×2): 3 [IU] via SUBCUTANEOUS
  Administered 2011-08-24: 2 [IU] via SUBCUTANEOUS
  Administered 2011-08-25: 3 [IU] via SUBCUTANEOUS
  Administered 2011-08-25: 2 [IU] via SUBCUTANEOUS
  Administered 2011-08-26: 3 [IU] via SUBCUTANEOUS
  Administered 2011-08-26: 2 [IU] via SUBCUTANEOUS

## 2011-08-22 MED ORDER — SODIUM CHLORIDE 0.9 % IV SOLN
INTRAVENOUS | Status: DC
  Administered 2011-08-22 – 2011-08-24 (×5): via INTRAVENOUS
  Administered 2011-08-24: 100 mL via INTRAVENOUS
  Administered 2011-08-25: 50 mL/h via INTRAVENOUS
  Administered 2011-08-25: 03:00:00 via INTRAVENOUS

## 2011-08-22 MED ORDER — INSULIN DETEMIR 100 UNIT/ML ~~LOC~~ SOLN
30.0000 [IU] | Freq: Two times a day (BID) | SUBCUTANEOUS | Status: DC
  Administered 2011-08-22: 40 [IU] via SUBCUTANEOUS
  Filled 2011-08-22: qty 10

## 2011-08-22 MED ORDER — TOPIRAMATE 100 MG PO TABS
200.0000 mg | ORAL_TABLET | Freq: Two times a day (BID) | ORAL | Status: DC
  Administered 2011-08-22 – 2011-08-26 (×9): 200 mg via ORAL
  Filled 2011-08-22 (×11): qty 2

## 2011-08-22 MED ORDER — INSULIN ASPART 100 UNIT/ML ~~LOC~~ SOLN
2.0000 [IU] | Freq: Four times a day (QID) | SUBCUTANEOUS | Status: DC
  Administered 2011-08-22: 3 [IU] via SUBCUTANEOUS

## 2011-08-22 MED ORDER — BACLOFEN 10 MG PO TABS
10.0000 mg | ORAL_TABLET | Freq: Three times a day (TID) | ORAL | Status: DC
  Administered 2011-08-22 – 2011-08-26 (×13): 10 mg via ORAL
  Filled 2011-08-22 (×16): qty 1

## 2011-08-22 MED ORDER — DEXTROSE 5 % IV SOLN
1.0000 g | INTRAVENOUS | Status: DC
  Filled 2011-08-22: qty 10

## 2011-08-22 MED ORDER — GABAPENTIN 300 MG PO CAPS
300.0000 mg | ORAL_CAPSULE | Freq: Three times a day (TID) | ORAL | Status: DC
  Administered 2011-08-22 – 2011-08-26 (×13): 300 mg via ORAL
  Filled 2011-08-22 (×16): qty 1

## 2011-08-22 MED ORDER — INSULIN DETEMIR 100 UNIT/ML ~~LOC~~ SOLN
30.0000 [IU] | Freq: Every day | SUBCUTANEOUS | Status: DC
  Administered 2011-08-22 – 2011-08-25 (×4): 30 [IU] via SUBCUTANEOUS
  Filled 2011-08-22: qty 10

## 2011-08-22 MED ORDER — MORPHINE SULFATE 2 MG/ML IJ SOLN
2.0000 mg | INTRAMUSCULAR | Status: DC | PRN
  Administered 2011-08-22 – 2011-08-25 (×11): 2 mg via INTRAVENOUS
  Filled 2011-08-22 (×11): qty 1

## 2011-08-22 MED ORDER — CLOPIDOGREL BISULFATE 75 MG PO TABS
75.0000 mg | ORAL_TABLET | Freq: Every day | ORAL | Status: DC
  Administered 2011-08-22 – 2011-08-23 (×2): 75 mg via ORAL
  Filled 2011-08-22 (×3): qty 1

## 2011-08-22 MED ORDER — INSULIN ASPART 100 UNIT/ML ~~LOC~~ SOLN: 0.0000 [IU] | Freq: Every day | SUBCUTANEOUS | Status: DC

## 2011-08-22 MED ORDER — CEFTRIAXONE SODIUM 1 G IJ SOLR
1.0000 g | INTRAMUSCULAR | Status: DC
  Administered 2011-08-22 – 2011-08-25 (×3): 1 g via INTRAVENOUS
  Filled 2011-08-22 (×6): qty 10

## 2011-08-22 NOTE — ED Notes (Signed)
Patient transported to CT 

## 2011-08-22 NOTE — H&P (Signed)
[name] is an 46 y.o. female.   Chief Complaint: Flank pain HPI: A 46 year old African American female with prior history of infected kidney stones as well as diabetes among other things who came into the emergency room with 2 days history of left flank pain nausea and an episode of vomiting. Symptoms are also associated with a fever and some chills. She has been trying to take her home medications but not helping. She's had some dysuria and frequency as well as generalized weakness. No diarrhea. She is a diabetic and takes her insulin as well as oral medications without a problem. She's had complex medical problems in the past but has been stable for a while. Bblood glucose today is over 200. CT scan showed presence of small kidney stones as well. Past Medical History  Diagnosis Date  . Hypertension   . Asthma   . Seizures   . Stroke     last 2003  . Heart murmur     born with   . Dysrhythmia     skipped beats  . Myocardial infarction     hi of x 2  last one 2003   . COPD (chronic obstructive pulmonary disease)     chronic bronchitis   . Pneumonia     hx of 2009  . GERD (gastroesophageal reflux disease)   . Nephrolithiasis     frequent urination , urination at nite   . Glaucoma   . Legally blind     left eye   . Headache     migraines   . Neuropathy     due to diabetes   . Dizziness     secondary to diabetes and hypertension   . Arthritis     joint pain   . Hx of blood clots     hematomas removed from left side of brain from 58mo to 89yrold   . Epilepsy idiopathic petit mal     last seizure 2012;controlled w/ topomax  . Diabetic neuropathy, painful   . Diabetes mellitus     since age [age]type 2 IDDM  . Sleep apnea     sleep study 2010 @ [facility];does not use Cpap ; mild  . CVA 11/14/2007    Qualifier: Diagnosis of  By: MaHassell DoneNP, NyTori Milks  . MIGRAINE HEADACHE 09/14/2006    Qualifier: Diagnosis of  By: MaHassell DoneNP, NyTori Milks    Past Surgical History    Procedure Date  . Kidney stone removal   . Brain hematoma evacutation     five procedures total, first procedure when [age]ths old, last at [age]ears of age.  . Shunt removal     shunt inserted at age 80 6emoved at age 46 . Eye surgery   . Retinal detachment surgery 1990  . Vaginal hysterectomy 1996  . Incisional hernia repair 05/05/2011    Procedure: LAPAROSCOPIC INCISIONAL HERNIA;  Surgeon: MaRolm BookbinderMD;  [facility];  Laterality: N/A;  . Mass excision 05/05/2011    Procedure: EXCISION MASS;  Surgeon: MaRolm BookbinderMD;  [facility]: General;  Laterality: Right;  . Hernia repair 05/05/11    lap incisional hernia    Family History  Problem Relation Age of Onset  . Cancer Mother     uterine  . Cancer Maternal Grandmother     brain  . Anesthesia problems Neg Hx    Social History:  reports that she quit  smoking about 24 years ago. Her smoking use included Cigarettes. She has a 30 pack-year smoking history. She has never used smokeless tobacco. She reports that she does not drink alcohol or use illicit drugs.  Allergies:  Allergies  Allergen Reactions  . Codeine Swelling and Other (See Comments)    Swelling and burning of mouth (inside)     (Not in a hospital admission)  Results for orders placed during the hospital encounter of 08/21/11 (from the past 48 hour(s))  BASIC METABOLIC PANEL     Status: Abnormal   Collection Time   08/21/11  6:02 PM      Component Value Range Comment   Sodium 137  135 - 145 mEq/L    Potassium 3.3 (*) 3.5 - 5.1 mEq/L    Chloride 104  96 - 112 mEq/L    CO2 20  19 - 32 mEq/L    Glucose, Bld 262 (*) 70 - 99 mg/dL    BUN 8  6 - 23 mg/dL    Creatinine, Ser 0.79  0.50 - 1.10 mg/dL    Calcium 9.1  8.4 - 10.5 mg/dL    GFR calc non Af Amer >90  >90 mL/min    GFR calc Af Amer >90  >90 mL/min   CBC WITH DIFFERENTIAL     Status: Abnormal   Collection Time   08/21/11  6:02 PM      Component Value Range  Comment   WBC 17.9 (*) 4.0 - 10.5 K/uL    RBC 4.10  3.87 - 5.11 MIL/uL    Hemoglobin 12.0  12.0 - 15.0 g/dL    HCT 36.4  36.0 - 46.0 %    MCV 88.8  78.0 - 100.0 fL    MCH 29.3  26.0 - 34.0 pg    MCHC 33.0  30.0 - 36.0 g/dL    RDW 14.3  11.5 - 15.5 %    Platelets 258  150 - 400 K/uL    Neutrophils Relative 80 (*) 43 - 77 %    Neutro Abs 14.3 (*) 1.7 - 7.7 K/uL    Lymphocytes Relative 10 (*) 12 - 46 %    Lymphs Abs 1.9  0.7 - 4.0 K/uL    Monocytes Relative 10  3 - 12 %    Monocytes Absolute 1.7 (*) 0.1 - 1.0 K/uL    Eosinophils Relative 0  0 - 5 %    Eosinophils Absolute 0.0  0.0 - 0.7 K/uL    Basophils Relative 0  0 - 1 %    Basophils Absolute 0.0  0.0 - 0.1 K/uL   URINALYSIS, ROUTINE W REFLEX MICROSCOPIC     Status: Abnormal   Collection Time   08/21/11  7:08 PM      Component Value Range Comment   Color, Urine RED (*) YELLOW BIOCHEMICALS MAY BE AFFECTED BY COLOR   APPearance TURBID (*) CLEAR    Specific Gravity, Urine 1.025  1.005 - 1.030    pH 6.0  5.0 - 8.0    Glucose, UA >1000 (*) NEGATIVE mg/dL    Hgb urine dipstick LARGE (*) NEGATIVE    Bilirubin Urine LARGE (*) NEGATIVE    Ketones, ur 15 (*) NEGATIVE mg/dL    Protein, ur 100 (*) NEGATIVE mg/dL    Urobilinogen, UA 1.0  0.0 - 1.0 mg/dL    Nitrite POSITIVE (*) NEGATIVE    Leukocytes, UA LARGE (*) NEGATIVE   URINE MICROSCOPIC-ADD ON     Status: Abnormal  Collection Time   08/21/11  7:08 PM      Component Value Range Comment   Squamous Epithelial / LPF RARE  RARE    WBC, UA 11-20  <3 WBC/hpf    RBC / HPF TOO NUMEROUS TO COUNT  <3 RBC/hpf    Bacteria, UA FEW (*) RARE   LACTIC ACID, PLASMA     Status: Normal   Collection Time   08/21/11 11:57 PM      Component Value Range Comment   Lactic Acid, Venous 1.2  0.5 - 2.2 mmol/L   HEPATIC FUNCTION PANEL     Status: Abnormal   Collection Time   08/21/11 11:58 PM      Component Value Range Comment   Total Protein 8.0  6.0 - 8.3 g/dL    Albumin 3.3 (*) 3.5 - 5.2 g/dL    AST 10   0 - 37 U/L    ALT 10  0 - 35 U/L    Alkaline Phosphatase 104  39 - 117 U/L    Total Bilirubin 0.4  0.3 - 1.2 mg/dL    Bilirubin, Direct 0.1  0.0 - 0.3 mg/dL    Indirect Bilirubin 0.3  0.3 - 0.9 mg/dL   LIPASE, BLOOD     Status: Normal   Collection Time   08/21/11 11:58 PM      Component Value Range Comment   Lipase 16  11 - 59 U/L   GLUCOSE, CAPILLARY     Status: Abnormal   Collection Time   08/22/11  2:38 AM      Component Value Range Comment   Glucose-Capillary 223 (*) 70 - 99 mg/dL    Ct Abdomen Pelvis Wo Contrast  08/22/2011  *RADIOLOGY REPORT*  Clinical Data: 46 year old female - right flank, abdominal and pelvic pain with hematuria.  CT ABDOMEN AND PELVIS WITHOUT CONTRAST  Technique:  Multidetector CT imaging of the abdomen and pelvis was performed following the standard protocol without intravenous contrast.  Comparison: 05/27/2011 and prior CTs  Findings: The liver, spleen, gallbladder, pancreas and adrenal glands are unremarkable except for a stable 1.5 x 2 cm right adrenal adenoma.  Multiple nonobstructing bilateral renal calculi are noted, all measuring 4-5 mm or less. There is mild right perinephric stranding, but no evidence of hydronephrosis or ureteral calculi.  Please note that parenchymal abnormalities may be missed as intravenous contrast was not administered. No free fluid, enlarged lymph nodes, biliary dilation or abdominal aortic aneurysm identified. The bowel, appendix and bladder are unremarkable. The patient is status post hysterectomy.  No acute or suspicious bony abnormalities are identified.  IMPRESSION: Nonspecific right perinephric inflammation without hydronephrosis or ureteral calculus. This could represent right renal inflammation/infection or a passed calculus.  Multiple bilateral nonobstructing renal calculi.  Right adrenal adenoma.  Original Report Authenticated By: Lura Em, M.D.    Review of Systems  Constitutional: Positive for fever and chills.  Eyes:  Negative.   Respiratory: Negative.   Cardiovascular: Negative.   Gastrointestinal: Positive for nausea, vomiting and abdominal pain.  Genitourinary: Positive for dysuria, urgency, frequency and flank pain.  Musculoskeletal: Negative.   Skin: Negative.   Neurological: Positive for weakness.  Endo/Heme/Allergies: Negative.   Psychiatric/Behavioral: Negative.     Blood pressure 114/74, pulse 95, temperature 99.3 F (37.4 C), temperature source Oral, resp. rate 18, SpO2 98.00%. Physical Exam  Constitutional: She is oriented to person, place, and time. She appears well-developed and well-nourished.       obese  HENT:  Head: Normocephalic and atraumatic.  Right Ear: External ear normal.  Left Ear: External ear normal.  Nose: Nose normal.  Mouth/Throat: Oropharynx is clear and moist.  Eyes: Conjunctivae and EOM are normal. Pupils are equal, round, and reactive to light.  Neck: Normal range of motion. Neck supple.  Cardiovascular: Normal rate, regular rhythm, normal heart sounds and intact distal pulses.   Respiratory: Effort normal and breath sounds normal.  GI: Soft. Bowel sounds are normal.  Musculoskeletal: Normal range of motion.  Neurological: She is alert and oriented to person, place, and time. She has normal reflexes.  Skin: Skin is warm and dry.  Psychiatric: She has a normal mood and affect. Her behavior is normal. Judgment and thought content normal.     Assessment/Plan A 46 year old female with known history of diabetes and kidney stones presenting with left-sided flank pain and evidence of urinary tract infection and probably infected small kidney stones. More likely she is having some form of pyelonephritis. She is afebrile right now but also had some nausea and vomiting earlier.  Plan #1 acute pyelonephritis: We'll admit the patient and start IV antibiotics. Urinalysis has been done and culture is pending. Once we get the sensitivities back we should be able to narrow  down her antibiotics. Because of the complex nature of her infection she's had prior infected stones we'll be very careful and may involve urology if we think she has infected stones. She is also diabetic which make some more susceptible to infection. Blood cultures have been obtained and we'll follow results as well.  #2 diabetes: Patient is on insulin with sliding scale insulin and glipizide. I'll continue with that.  #3 hypertension: Stable blood pressure continue home meds monitor. Next  #4 hyperlipidemia: Continue as is startin  #5 nephrolithiasis: This is chronic and nonobstructive. Continue antibiotics for now pain control and if symptoms persist get urology involved.  #6 multiple other medical problems: These are chronic and stable. Continue home medications.  GARBA,LAWAL 08/22/2011, 4:09 AM

## 2011-08-22 NOTE — ED Notes (Signed)
CBG 223

## 2011-08-22 NOTE — Progress Notes (Signed)
Patient seen examined Continue antibiotics ,follow cultures  Start flomax

## 2011-08-22 NOTE — ED Notes (Signed)
Patient has returned from CT

## 2011-08-22 NOTE — ED Provider Notes (Signed)
History     CSN: [id number]  Arrival date & time 08/21/11  30   First MD Initiated Contact with Patient 08/21/11 2320      Chief Complaint  Patient presents with  . Flank Pain    (Consider location/radiation/quality/duration/timing/severity/associated sxs/prior treatment) HPI Patient is a 46 year old female with history of multiple medical problems including kidney stones and urinary tract infections who presents today complaining of 10 out of 10 right-sided flank pain that began on Wednesday. Patient has associated nausea and has temperature of 100.5 on presentation. She is tachycardic to the 110-120s.  Patient endorses nausea but denies any vomiting. She also endorses some dysuria. Patient endorses right lower abdominal pain as well. She has been seen by urologist in the past and required intervention for kidney stones. Patient also has surgical history significant for hernia repair. Patient denies any chest pain or shortness of breath today. She denies any recent cough as well. There are no other associated or modifying factors. Past Medical History  Diagnosis Date  . Hypertension   . Asthma   . Seizures   . Stroke     last 2003  . Heart murmur     born with   . Dysrhythmia     skipped beats  . Myocardial infarction     hi of x 2  last one 2003   . COPD (chronic obstructive pulmonary disease)     chronic bronchitis   . Pneumonia     hx of 2009  . GERD (gastroesophageal reflux disease)   . Nephrolithiasis     frequent urination , urination at nite   . Glaucoma   . Legally blind     left eye   . Headache     migraines   . Neuropathy     due to diabetes   . Dizziness     secondary to diabetes and hypertension   . Arthritis     joint pain   . Hx of blood clots     hematomas removed from left side of brain from 77mo to 89yrold   . Epilepsy idiopathic petit mal     last seizure 2012;controlled w/ topomax  . Diabetic neuropathy, painful   . Diabetes mellitus    since age [age]type 2 IDDM  . Sleep apnea     sleep study 2010 [facility];does not use Cpap ; mild  . CVA 11/14/2007    Qualifier: Diagnosis of  By: MaHassell DoneNP, NyTori Milks  . MIGRAINE HEADACHE 09/14/2006    Qualifier: Diagnosis of  By: MaHassell DoneNP, NyTori Milks    Past Surgical History  Procedure Date  . Kidney stone removal   . Brain hematoma evacutation     five procedures total, first procedure when [age]onths old, last at [age]ears of age.  . Shunt removal     shunt inserted at age [age]emoved at age 46 . Eye surgery   . Retinal detachment surgery 1990  . Vaginal hysterectomy 1996  . Incisional hernia repair 05/05/2011    Procedure: LAPAROSCOPIC INCISIONAL HERNIA;  Surgeon: MaRolm BookbinderMD;  [facility];  Laterality: N/A;  . Mass excision 05/05/2011    Procedure: EXCISION MASS;  Surgeon: MaRolm BookbinderMD;  [facility];  Laterality: Right;  . Hernia repair 05/05/11    lap incisional hernia    Family History  Problem Relation Age of Onset  . Cancer Mother  uterine  . Cancer Maternal Grandmother     brain  . Anesthesia problems Neg Hx     History  Substance Use Topics  . Smoking status: Former Smoker -- 1.0 packs/day for 30 years    Types: Cigarettes    Quit date: 01/20/1987  . Smokeless tobacco: Never Used  . Alcohol Use: No    OB History    Grav Para Term Preterm Abortions TAB SAB Ect Mult Living                  Review of Systems  Constitutional: Positive for fever, chills and fatigue.  HENT: Negative.   Eyes: Negative.   Respiratory: Negative.   Cardiovascular: Negative.   Gastrointestinal: Positive for nausea.  Genitourinary: Positive for dysuria and flank pain.  Musculoskeletal: Negative.   Neurological: Negative.   Hematological: Negative.   Psychiatric/Behavioral: Negative.   All other systems reviewed and are negative.    Allergies  Codeine  Home Medications   Current Outpatient Rx    Name Route Sig Dispense Refill  . ALBUTEROL SULFATE HFA 108 (90 BASE) MCG/ACT IN AERS Inhalation Inhale 2 puffs into the lungs every 6 (six) hours as needed. Wheezing     . BACLOFEN 10 MG PO TABS Oral Take 10 mg by mouth 3 (three) times daily.    . BUDESONIDE-FORMOTEROL FUMARATE 160-4.5 MCG/ACT IN AERO Inhalation Inhale 2 puffs into the lungs 2 (two) times daily.    [name] CLOPIDOGREL BISULFATE 75 MG PO TABS Oral Take 75 mg by mouth daily.    [name] GABAPENTIN 300 MG PO CAPS Oral Take 300 mg by mouth 3 (three) times daily.    [name] GLIPIZIDE ER 10 MG PO TB24 Oral Take 20 mg by mouth daily.     . INSULIN ASPART 100 UNIT/ML [place] SOLN Subcutaneous Inject 2-14 Units into the skin 4 (four) times daily. Sliding scale    . INSULIN DETEMIR 100 UNIT/ML [place] SOLN Subcutaneous Inject 30-40 Units into the skin 2 (two) times daily. 30 am 40 pm    . ONDANSETRON HCL 8 MG PO TABS Oral Take 8 mg by mouth every 8 (eight) hours as needed. For nausea and vomiting    . RANITIDINE HCL 150 MG PO TABS Oral Take 150 mg by mouth 2 (two) times daily.    . TOPIRAMATE 200 MG PO TABS Oral Take 200 mg by mouth 2 (two) times daily.    . TRAZODONE HCL 150 MG PO TABS Oral Take 150 mg by mouth at bedtime.    [name] PROMETHAZINE HCL 25 MG RE SUPP Rectal Place 1 suppository (25 mg total) rectally every 6 (six) hours as needed for nausea. 12 each 0    BP 114/74  Pulse 95  Temp 99.3 F (37.4 C) (Oral)  Resp 18  SpO2 98%  Physical Exam  Nursing note and vitals reviewed. GEN: Well-developed, obese female in mild distress HEENT: Atraumatic, normocephalic. Oropharynx clear without erythema NECK: Trachea midline, no meningismus CV: Tachycardic with regular rhythm. No murmurs, rubs, or gallops PULM: No respiratory distress.  No crackles, wheezes, or rales. GI: soft, right lower quadrant discomfort on palpation. No guarding, rebound, . + bowel sounds  GU: R CVAT Neuro: cranial nerves grossly 2-12 intact, no abnormalities of strength or sensation, A  and O x 3 MSK: Patient moves all 4 extremities symmetrically, no deformity, edema, or injury noted Skin: No rashes petechiae, purpura, or jaundice Psych: no abnormality of mood   ED Course  Procedures (including critical  care time)  Labs Reviewed  BASIC METABOLIC PANEL - Abnormal; Notable for the following:    Potassium 3.3 (*)     Glucose, Bld 262 (*)     All other components within normal limits  CBC WITH DIFFERENTIAL - Abnormal; Notable for the following:    WBC 17.9 (*)     Neutrophils Relative 80 (*)     Neutro Abs 14.3 (*)     Lymphocytes Relative 10 (*)     Monocytes Absolute 1.7 (*)     All other components within normal limits  URINALYSIS, ROUTINE W REFLEX MICROSCOPIC - Abnormal; Notable for the following:    Color, Urine RED (*)  BIOCHEMICALS MAY BE AFFECTED BY COLOR   APPearance TURBID (*)     Glucose, UA >1000 (*)     Hgb urine dipstick LARGE (*)     Bilirubin Urine LARGE (*)     Ketones, ur 15 (*)     Protein, ur 100 (*)     Nitrite POSITIVE (*)     Leukocytes, UA LARGE (*)     All other components within normal limits  URINE MICROSCOPIC-ADD ON - Abnormal; Notable for the following:    Bacteria, UA FEW (*)     All other components within normal limits  HEPATIC FUNCTION PANEL - Abnormal; Notable for the following:    Albumin 3.3 (*)     All other components within normal limits  GLUCOSE, CAPILLARY - Abnormal; Notable for the following:    Glucose-Capillary 223 (*)     All other components within normal limits  GLUCOSE, CAPILLARY - Abnormal; Notable for the following:    Glucose-Capillary 187 (*)     All other components within normal limits  LACTIC ACID, PLASMA  LIPASE, BLOOD  URINE CULTURE  CULTURE, BLOOD (ROUTINE X 2)  CULTURE, BLOOD (ROUTINE X 2)   Ct Abdomen Pelvis Wo Contrast  08/22/2011  *RADIOLOGY REPORT*  Clinical Data: 45 year old female - right flank, abdominal and pelvic pain with hematuria.  CT ABDOMEN AND PELVIS WITHOUT CONTRAST  Technique:   Multidetector CT imaging of the abdomen and pelvis was performed following the standard protocol without intravenous contrast.  Comparison: 05/27/2011 and prior CTs  Findings: The liver, spleen, gallbladder, pancreas and adrenal glands are unremarkable except for a stable 1.5 x 2 cm right adrenal adenoma.  Multiple nonobstructing bilateral renal calculi are noted, all measuring 4-5 mm or less. There is mild right perinephric stranding, but no evidence of hydronephrosis or ureteral calculi.  Please note that parenchymal abnormalities may be missed as intravenous contrast was not administered. No free fluid, enlarged lymph nodes, biliary dilation or abdominal aortic aneurysm identified. The bowel, appendix and bladder are unremarkable. The patient is status post hysterectomy.  No acute or suspicious bony abnormalities are identified.  IMPRESSION: Nonspecific right perinephric inflammation without hydronephrosis or ureteral calculus. This could represent right renal inflammation/infection or a passed calculus.  Multiple bilateral nonobstructing renal calculi.  Right adrenal adenoma.  Original Report Authenticated By: Lura Em, M.D.     1. Pyelonephritis   2. Nephrolithiasis   3. Depressive disorder, not elsewhere classified   4. Obesity, Class III, BMI 40-49.9 (morbid obesity)   5. Unspecified essential hypertension       MDM  Patient was evaluated by myself. Prior to my arrival for the shift the patient had had laboratory workup initiated per protocol. Patient did have evidence of urinary tract infection with x-ray positive urine as well as large amounts  of blood in her urine. Renal panel was within normal limits aside from slight hypokalemia of 3.3. Patient was somewhat hyperglycemic with a glucose of 262. She was also tachycardic with temperature 100.5. She was given Tylenol and IV fluids as well as Zofran and morphine were ordered for pain and nausea. Lactate was ordered and was within normal  limits. Blood cultures and urine cultures were obtained given patient's presentation and a dose of Rocephin IV was given. CT of abdomen and pelvis without contrast was also performed. This showed multiple non-obstructing renal calculi. Patient had improvement in her vital signs on 2 L normal saline IV bolus. She received additional doses of pain and nausea medication. Repeat fingerstick still showed hyperglycemia. A dose of 5 units of regular insulin subcutaneous was given. Following this blood sugar was in the 180s an hour afterwards. A call was placed to hospitalist. Patient did not fit CDU protocol given her history of diabetes. She was given a stable at this point was accepted for admission by Dr. Jonelle Sidle.        Chauncy Passy, MD 08/22/11 [phone]

## 2011-08-23 ENCOUNTER — Inpatient Hospital Stay (HOSPITAL_COMMUNITY): Payer: PRIVATE HEALTH INSURANCE

## 2011-08-23 ENCOUNTER — Encounter (HOSPITAL_COMMUNITY): Payer: Self-pay | Admitting: Urology

## 2011-08-23 LAB — COMPREHENSIVE METABOLIC PANEL
ALT: 8 U/L (ref 0–35)
AST: 9 U/L (ref 0–37)
Albumin: 2.5 g/dL — ABNORMAL LOW (ref 3.5–5.2)
Alkaline Phosphatase: 89 U/L (ref 39–117)
BUN: 9 mg/dL (ref 6–23)
CO2: 23 mEq/L (ref 19–32)
Calcium: 8.6 mg/dL (ref 8.4–10.5)
Chloride: 107 mEq/L (ref 96–112)
Creatinine, Ser: 0.9 mg/dL (ref 0.50–1.10)
GFR calc Af Amer: 88 mL/min — ABNORMAL LOW (ref >90)
GFR calc non Af Amer: 76 mL/min — ABNORMAL LOW (ref >90)
Glucose, Bld: 179 mg/dL — ABNORMAL HIGH (ref 70–99)
Potassium: 3.5 mEq/L (ref 3.5–5.1)
Sodium: 139 mEq/L (ref 135–145)
Total Bilirubin: 0.2 mg/dL — ABNORMAL LOW (ref 0.3–1.2)
Total Protein: 6.8 g/dL (ref 6.0–8.3)

## 2011-08-23 LAB — GLUCOSE, CAPILLARY
Glucose-Capillary: 137 mg/dL — ABNORMAL HIGH (ref 70–99)
Glucose-Capillary: 163 mg/dL — ABNORMAL HIGH (ref 70–99)
Glucose-Capillary: 180 mg/dL — ABNORMAL HIGH (ref 70–99)
Glucose-Capillary: 116 mg/dL — ABNORMAL HIGH (ref 70–99)

## 2011-08-23 LAB — CBC
HCT: 31.3 % — ABNORMAL LOW (ref 36.0–46.0)
Hemoglobin: 10.1 g/dL — ABNORMAL LOW (ref 12.0–15.0)
MCH: 29.3 pg (ref 26.0–34.0)
MCHC: 32.3 g/dL (ref 30.0–36.0)
MCV: 90.7 fL (ref 78.0–100.0)
Platelets: 228 10*3/uL (ref 150–400)
RBC: 3.45 MIL/uL — ABNORMAL LOW (ref 3.87–5.11)
RDW: 14.6 % (ref 11.5–15.5)
WBC: 8.9 10*3/uL (ref 4.0–10.5)

## 2011-08-23 LAB — URINE CULTURE: Colony Count: 25000

## 2011-08-23 MED ORDER — IOHEXOL 300 MG/ML  SOLN
125.0000 mL | Freq: Once | INTRAMUSCULAR | Status: AC | PRN
  Administered 2011-08-23: 125 mL via INTRAVENOUS

## 2011-08-23 MED ORDER — ENOXAPARIN SODIUM 40 MG/0.4ML ~~LOC~~ SOLN
40.0000 mg | Freq: Every day | SUBCUTANEOUS | Status: DC
  Administered 2011-08-23: 40 mg via SUBCUTANEOUS
  Filled 2011-08-23 (×2): qty 0.4

## 2011-08-23 MED ORDER — PSYLLIUM 95 % PO PACK
1.0000 | PACK | Freq: Two times a day (BID) | ORAL | Status: DC
  Administered 2011-08-23 – 2011-08-26 (×7): 1 via ORAL
  Filled 2011-08-23 (×9): qty 1

## 2011-08-23 MED ORDER — PSYLLIUM 95 % PO PACK: 1.0000 | PACK | Freq: Every day | ORAL | Status: DC

## 2011-08-23 NOTE — Progress Notes (Signed)
[facility] PROGRESS NOTE  [name] MIW:[id number] DOB: 19-Jan-1966 DOA: 08/21/2011 PCP: Philis Fendt, MD  Assessment/Plan: Principal Problem:  *Pyelonephritis Active Problems:  DIABETES MELLITUS, TYPE II  HYPERTENSION  MYOCARDIAL INFARCTION, HX OF  Nephrolithiasis  #1 acute pyelonephritis: On Rocephin Urinalysis has been done and culture is pending. Treated urine culture showed Escherichia coli, Once we get the sensitivities back we should be able to narrow down her antibiotics. Consult urology although  stones are nonobstructing,due to pain,  there is no signs of sepsis She is also diabetic which make some more susceptible to infection. Blood cultures have been obtained and we'll follow results as well.  #2 diabetes: Patient is on insulin with sliding scale insulin and glipizide. I'll continue with that.  #3 hypertension: Stable blood pressure continue home meds monitor. Next  #4 hyperlipidemia: Continue as is startin  #5 nephrolithiasis: This is chronic and nonobstructive. Continue antibiotics for now pain control and if symptoms persist get urology involved. Started Flomax #6 multiple other medical problems: These are chronic and stable. Continue home medications   Code Status: full Family Communication: family updated about patient's clinical progress Disposition Plan:  As above        HPI/Subjective: Still complaining about lower abdominal pain   Objective: Filed Vitals:   08/22/11 1415 08/22/11 1800 08/22/11 2122 08/23/11 0557  BP: 134/89 136/85 124/79 137/73  Pulse: 91 98 94 92  Temp: 98.9 F (37.2 C) 98.5 F (36.9 C) 97.8 F (36.6 C) 98.8 F (37.1 C)  TempSrc: Oral Oral Oral Oral  Resp: 18 18 18 19   Height:      Weight:      SpO2: 100% 100% 99% 98%    Intake/Output Summary (Last 24 hours) at 08/23/11 0958 Last data filed at 08/23/11 0420  Gross per 24 hour  Intake    240 ml  Output   2250 ml  Net  -2010 ml    Exam:  HENT:  Head:  Atraumatic.  Nose: Nose normal.  Mouth/Throat: Oropharynx is clear and moist.  Eyes: Conjunctivae are normal. Pupils are equal, round, and reactive to light. No scleral icterus.  Neck: Neck supple. No tracheal deviation present.  Cardiovascular: Normal rate, regular rhythm, normal heart sounds and intact distal pulses.  Pulmonary/Chest: Effort normal and breath sounds normal. No respiratory distress.  Abdominal: Soft. Normal appearance and bowel sounds are normal. She exhibits no distension. There is no tenderness.  Musculoskeletal: She exhibits no edema and no tenderness.  Neurological: She is alert. No cranial nerve deficit.    Data Reviewed: Basic Metabolic Panel:  Lab 25/00/37 0003 08/21/11 1802  NA 139 137  K 3.5 3.3*  CL 107 104  CO2 23 20  GLUCOSE 179* 262*  BUN 9 8  CREATININE 0.90 0.79  CALCIUM 8.6 9.1  MG -- --  PHOS -- --    Liver Function Tests:  Lab 08/23/11 0003 08/21/11 2358  AST 9 10  ALT 8 10  ALKPHOS 89 104  BILITOT 0.2* 0.4  PROT 6.8 8.0  ALBUMIN 2.5* 3.3*    Lab 08/21/11 2358  LIPASE 16  AMYLASE --   No results found for this basename: AMMONIA:5 in the last 168 hours  CBC:  Lab 08/23/11 0003 08/21/11 1802  WBC 8.9 17.9*  NEUTROABS -- 14.3*  HGB 10.1* 12.0  HCT 31.3* 36.4  MCV 90.7 88.8  PLT 228 258    Cardiac Enzymes: No results found for this basename: CKTOTAL:5,CKMB:5,CKMBINDEX:5,TROPONINI:5 in the last 168 hours  BNP (last 3 results) No results found for this basename: PROBNP:3 in the last 8760 hours   CBG:  Lab 08/23/11 0839 08/22/11 2150 08/22/11 1741 08/22/11 1200 08/22/11 0725  GLUCAP 137* 189* 143* 163* 180*    Recent Results (from the past 240 hour(s))  CULTURE, BLOOD (ROUTINE X 2)     Status: Normal (Preliminary result)   Collection Time   08/21/11 11:50 PM      Component Value Range Status Comment   Specimen Description BLOOD RIGHT ARM   Final    Special Requests BOTTLES DRAWN AEROBIC AND ANAEROBIC 10CC EACH   Final     Culture  Setup Time 08/22/2011 09:07   Final    Culture     Final    Value:        BLOOD CULTURE RECEIVED NO GROWTH TO DATE CULTURE WILL BE HELD FOR 5 DAYS BEFORE ISSUING A FINAL NEGATIVE REPORT   Report Status PENDING   Incomplete   CULTURE, BLOOD (ROUTINE X 2)     Status: Normal (Preliminary result)   Collection Time   08/21/11 11:55 PM      Component Value Range Status Comment   Specimen Description BLOOD RIGHT HAND   Final    Special Requests BOTTLES DRAWN AEROBIC AND ANAEROBIC 5CC EACH   Final    Culture  Setup Time 08/22/2011 09:07   Final    Culture     Final    Value:        BLOOD CULTURE RECEIVED NO GROWTH TO DATE CULTURE WILL BE HELD FOR 5 DAYS BEFORE ISSUING A FINAL NEGATIVE REPORT   Report Status PENDING   Incomplete      Studies: Ct Abdomen Pelvis Wo Contrast  08/22/2011  *RADIOLOGY REPORT*  Clinical Data: 46 year old female - right flank, abdominal and pelvic pain with hematuria.  CT ABDOMEN AND PELVIS WITHOUT CONTRAST  Technique:  Multidetector CT imaging of the abdomen and pelvis was performed following the standard protocol without intravenous contrast.  Comparison: 05/27/2011 and prior CTs  Findings: The liver, spleen, gallbladder, pancreas and adrenal glands are unremarkable except for a stable 1.5 x 2 cm right adrenal adenoma.  Multiple nonobstructing bilateral renal calculi are noted, all measuring 4-5 mm or less. There is mild right perinephric stranding, but no evidence of hydronephrosis or ureteral calculi.  Please note that parenchymal abnormalities may be missed as intravenous contrast was not administered. No free fluid, enlarged lymph nodes, biliary dilation or abdominal aortic aneurysm identified. The bowel, appendix and bladder are unremarkable. The patient is status post hysterectomy.  No acute or suspicious bony abnormalities are identified.  IMPRESSION: Nonspecific right perinephric inflammation without hydronephrosis or ureteral calculus. This could represent  right renal inflammation/infection or a passed calculus.  Multiple bilateral nonobstructing renal calculi.  Right adrenal adenoma.  Original Report Authenticated By: Lura Em, M.D.    Scheduled Meds:   . baclofen  10 mg Oral TID  . cefTRIAXone (ROCEPHIN)  IV  1 g Intravenous Q24H  . clopidogrel  75 mg Oral Daily  . famotidine  10 mg Oral Daily  . gabapentin  300 mg Oral TID  . glipiZIDE  20 mg Oral Daily  . insulin aspart  0-15 Units Subcutaneous TID WC  . insulin aspart  0-5 Units Subcutaneous QHS  . insulin detemir  30 Units Subcutaneous QHS  . insulin detemir  40 Units Subcutaneous Daily  . Tamsulosin HCl  0.4 mg Oral QPC breakfast  . topiramate  200  mg Oral BID  . traZODone  150 mg Oral QHS  . DISCONTD: insulin aspart  2-14 Units Subcutaneous QID  . DISCONTD: insulin detemir  30-40 Units Subcutaneous BID   Continuous Infusions:   . sodium chloride 100 mL/hr at 08/23/11 1388    Principal Problem:  *Pyelonephritis Active Problems:  DIABETES MELLITUS, TYPE II  HYPERTENSION  MYOCARDIAL INFARCTION, HX OF  Nephrolithiasis    Time spent: 40 minutes   [facility] Pager [phone]. If 8PM-8AM, please contact night-coverage at [email or web address], password [facility] 08/23/2011, 9:58 AM  LOS: 2 days

## 2011-08-23 NOTE — Consult Note (Signed)
Subjective: [name] is a 46 yo BF who was admitted on 8/1 with right flank pain.  She has a history of stones and a CTU was done.  This showed small bilateral renal stones without obstruction.   She had findings consistent with right pyelonephritis and her UA was positive.  She has had hematuria associated with this episode as well.  She has continued to have intermittantly severe pain despite being afebrile and having normalization of her WBC count.  Her urologic history is significant for removal of 3 large renal stones percutaneously at [facility] about 3 years ago.   She has chronic pain issues and is on oxycodone for neuropathy related to her diabetes. ROS: Negative except she had nausea on admission.  She has had no irritative voiding symptoms but has had the bleeding.  She has pain and burning in her legs from the neuropathy.  She has intermittant headaches.  Active Ambulatory Problems    Diagnosis Date Noted  . DIABETES MELLITUS, TYPE II 09/30/2006  . HYPERLIPIDEMIA 09/13/2006  . DEPRESSION 09/13/2006  . BLINDNESS, [name], [place] DEFINITION 09/14/2006  . HYPERTENSION 09/13/2006  . MYOCARDIAL INFARCTION, HX OF 09/13/2006  . GERD 09/13/2006  . SEIZURE DISORDER 09/13/2006  . Tachycardia 05/08/2011  . Incisional hernia 05/10/2011  . Obesity, Class III, BMI 40-49.9 (morbid obesity) 05/10/2011   Resolved Ambulatory Problems    Diagnosis Date Noted  . MIGRAINE HEADACHE 09/14/2006  . CVA 11/14/2007  . DIZZINESS 10/12/2006   Past Medical History  Diagnosis Date  . Hypertension   . Asthma   . Seizures   . Stroke   . Heart murmur   . Dysrhythmia   . Myocardial infarction   . COPD (chronic obstructive pulmonary disease)   . Pneumonia   . GERD (gastroesophageal reflux disease)   . Nephrolithiasis   . Headache   . Neuropathy   . Dizziness   . Arthritis   . Hx of blood clots   . Epilepsy idiopathic petit mal   . Diabetic neuropathy, painful   . Diabetes mellitus   . Sleep apnea     . Glaucoma   . Legally blind    Past Surgical History  Procedure Date  . Kidney stone removal   . Brain hematoma evacutation     five procedures total, first procedure when 55 months old, last at 46 years of age.  . Shunt removal     shunt inserted at age 44 removed at age 55   . Eye surgery   . Retinal detachment surgery 1990  . Vaginal hysterectomy 1996  . Incisional hernia repair 05/05/2011    Procedure: LAPAROSCOPIC INCISIONAL HERNIA;  Surgeon: Karen Bookbinder, MD;  Location: [facility];  Service: General;  Laterality: N/A;  . Mass excision 05/05/2011    Procedure: EXCISION MASS;  Surgeon: Karen Bookbinder, MD;  Location: [facility];  Service: General;  Laterality: Right;  . Hernia repair 05/05/11    lap incisional hernia  She has had a right PCNL about 3 years ago at [facility]  History   Social History  . Marital Status: Widowed    Spouse Name: N/A    Number of Children: N/A  . Years of Education: N/A   Occupational History  . Not on file.   Social History Main Topics  . Smoking status: Former Smoker -- 1.0 packs/day for 30 years    Types: Cigarettes    Quit date: 01/20/1987  . Smokeless tobacco: Never Used  . Alcohol  Use: No  . Drug Use: No     hx of marijuana use no longer uses   . Sexually Active: Not Currently    Birth Control/ Protection: Surgical   Other Topics Concern  . Not on file   Social History Narrative  . No narrative on file   Family History  Problem Relation Age of Onset  . Cancer Mother     uterine  . Cancer Maternal Grandmother     brain  . Anesthesia problems Neg Hx       Objective: Vital signs in last 24 hours: Temp:  [97.8 F (36.6 C)-98.8 F (37.1 C)] 98.8 F (37.1 C) (08/04 0557) Pulse Rate:  [92-98] 92  (08/04 0557) Resp:  [18-19] 19  (08/04 0557) BP: (124-137)/(73-85) 137/73 mmHg (08/04 0557) SpO2:  [98 %-100 %] 98 % (08/04 0557)  Intake/Output from previous day: 08/03 0701 - 08/04 0700 In: 480 [P.O.:480] Out: 2250  [Urine:2250] Intake/Output this shift: Total I/O In: 360 [P.O.:360] Out: 300 [Urine:300]  General appearance: alert and no distress Head: Normocephalic, without obvious abnormality, atraumatic Neck: no adenopathy, no carotid bruit, no JVD, supple, symmetrical, trachea midline and thyroid not enlarged, symmetric, no tenderness/mass/nodules Resp: clear to auscultation bilaterally Cardio: regular rate and rhythm GI: soft, obese with + BS.  moderate right CVA tenderness.  No mass, HSM  or hernia. Extremities: extremities normal, atraumatic, no cyanosis or edema Skin: Skin color, texture, turgor normal. No rashes or lesions Lymph nodes: No inguinal adenopathy Neurologic: Grossly normal but alert and oriented x 3.   Lab Results:   Basename 08/23/11 0003 08/21/11 1802  WBC 8.9 17.9*  HGB 10.1* 12.0  HCT 31.3* 36.4  PLT 228 258   BMET  Basename 08/23/11 0003 08/21/11 1802  NA 139 137  K 3.5 3.3*  CL 107 104  CO2 23 20  GLUCOSE 179* 262*  BUN 9 8  CREATININE 0.90 0.79  CALCIUM 8.6 9.1   PT/INR No results found for this basename: LABPROT:2,INR:2 in the last 72 hours ABG No results found for this basename: PHART:2,PCO2:2,PO2:2,HCO3:2 in the last 72 hours  Studies/Results: Ct Abdomen Pelvis Wo Contrast  08/22/2011  *RADIOLOGY REPORT*  Clinical Data: 46 year old female - right flank, abdominal and pelvic pain with hematuria.  CT ABDOMEN AND PELVIS WITHOUT CONTRAST  Technique:  Multidetector CT imaging of the abdomen and pelvis was performed following the standard protocol without intravenous contrast.  Comparison: 05/27/2011 and prior CTs  Findings: The liver, spleen, gallbladder, pancreas and adrenal glands are unremarkable except for a stable 1.5 x 2 cm right adrenal adenoma.  Multiple nonobstructing bilateral renal calculi are noted, all measuring 4-5 mm or less. There is mild right perinephric stranding, but no evidence of hydronephrosis or ureteral calculi.  Please note that  parenchymal abnormalities may be missed as intravenous contrast was not administered. No free fluid, enlarged lymph nodes, biliary dilation or abdominal aortic aneurysm identified. The bowel, appendix and bladder are unremarkable. The patient is status post hysterectomy.  No acute or suspicious bony abnormalities are identified.  IMPRESSION: Nonspecific right perinephric inflammation without hydronephrosis or ureteral calculus. This could represent right renal inflammation/infection or a passed calculus.  Multiple bilateral nonobstructing renal calculi.  Right adrenal adenoma.  Original Report Authenticated By: Lura Em, M.D.    Anti-infectives: Anti-infectives     Start     Dose/Rate Route Frequency Ordered Stop   08/22/11 2300   cefTRIAXone (ROCEPHIN) 1 g in dextrose 5 % 50 mL IVPB  1 g 100 mL/hr over 30 Minutes Intravenous Every 24 hours 08/22/11 0814     08/22/11 0700   cefTRIAXone (ROCEPHIN) 1 g in dextrose 5 % 50 mL IVPB  Status:  Discontinued        1 g 100 mL/hr over 30 Minutes Intravenous Every 24 hours 08/22/11 0649 08/22/11 0814   08/21/11 2345   cefTRIAXone (ROCEPHIN) 1 g in dextrose 5 % 50 mL IVPB        1 g 100 mL/hr over 30 Minutes Intravenous  Once 08/21/11 2331 08/22/11 0033          Current Facility-Administered Medications  Medication Dose Route Frequency Provider Last Rate Last Dose  . 0.9 %  sodium chloride infusion   Intravenous Continuous Reyne Dumas, MD 100 mL/hr at 08/23/11 0936    . acetaminophen (TYLENOL) tablet 650 mg  650 mg Oral Q6H PRN Elwyn Reach, MD   650 mg at 08/23/11 1401   Or  . acetaminophen (TYLENOL) suppository 650 mg  650 mg Rectal Q6H PRN Elwyn Reach, MD      . baclofen (LIORESAL) tablet 10 mg  10 mg Oral TID Elwyn Reach, MD   10 mg at 08/23/11 0944  . cefTRIAXone (ROCEPHIN) 1 g in dextrose 5 % 50 mL IVPB  1 g Intravenous Q24H Elwyn Reach, MD   1 g at 08/22/11 2300  . clopidogrel (PLAVIX) tablet 75 mg  75 mg Oral  Daily Elwyn Reach, MD   75 mg at 08/23/11 0944  . enoxaparin (LOVENOX) injection 40 mg  40 mg Subcutaneous Daily Reyne Dumas, MD   40 mg at 08/23/11 1238  . famotidine (PEPCID) tablet 10 mg  10 mg Oral Daily Elwyn Reach, MD   10 mg at 08/23/11 0944  . gabapentin (NEURONTIN) capsule 300 mg  300 mg Oral TID Elwyn Reach, MD   300 mg at 08/23/11 0944  . glipiZIDE (GLUCOTROL XL) 24 hr tablet 20 mg  20 mg Oral Daily Elwyn Reach, MD   20 mg at 08/23/11 0944  . insulin aspart (novoLOG) injection 0-15 Units  0-15 Units Subcutaneous TID WC Elwyn Reach, MD   3 Units at 08/23/11 1235  . insulin aspart (novoLOG) injection 0-5 Units  0-5 Units Subcutaneous QHS Elwyn Reach, MD      . insulin detemir (LEVEMIR) injection 30 Units  30 Units Subcutaneous QHS Elwyn Reach, MD   30 Units at 08/22/11 2150  . insulin detemir (LEVEMIR) injection 40 Units  40 Units Subcutaneous Daily Elwyn Reach, MD   40 Units at 08/23/11 0944  . morphine 2 MG/ML injection 2 mg  2 mg Intravenous Q4H PRN Elwyn Reach, MD   2 mg at 08/23/11 0936  . ondansetron (ZOFRAN) tablet 4 mg  4 mg Oral Q6H PRN Elwyn Reach, MD       Or  . ondansetron (ZOFRAN) injection 4 mg  4 mg Intravenous Q6H PRN Elwyn Reach, MD      . psyllium (HYDROCIL/METAMUCIL) packet 1 packet  1 packet Oral BID Reyne Dumas, MD   1 packet at 08/23/11 1401  . Tamsulosin HCl (FLOMAX) capsule 0.4 mg  0.4 mg Oral QPC breakfast Reyne Dumas, MD   0.4 mg at 08/23/11 0944  . topiramate (TOPAMAX) tablet 200 mg  200 mg Oral BID Elwyn Reach, MD   200 mg at 08/23/11 0944  . traZODone (DESYREL) tablet 150 mg  150  mg Oral QHS Elwyn Reach, MD   150 mg at 08/22/11 2145  . DISCONTD: psyllium (HYDROCIL/METAMUCIL) packet 1 packet  1 packet Oral Daily Reyne Dumas, MD        Assessment: Right pyelonephritis with non-obstructing stones with persistent pain after 3 days of antibiotics. She has hematuria that may be related to her  UTI and plavix.   It is unlikely to be from a stone.  It is possible with her diabetes that she could have papillary necrosis with a sloughed papilla contributing to her symptoms. She has a history of chronic and on oxycodone at home and this could color her pain perception.  Plan: Continue current care with antibiotics and analgesics. I will order a CT with IV contrast only to assess the kidney and ureter for other abnormalities.  If she continues to be symptomatic for more than another day or two, I will consider cystoscopy and retrograde pyelography with possible stenting but I believe that is premature.   LOS: 2 days    Malka So 08/23/2011

## 2011-08-24 ENCOUNTER — Encounter (HOSPITAL_COMMUNITY): Payer: Self-pay | Admitting: Anesthesiology

## 2011-08-24 ENCOUNTER — Inpatient Hospital Stay (HOSPITAL_COMMUNITY): Payer: PRIVATE HEALTH INSURANCE | Admitting: Anesthesiology

## 2011-08-24 ENCOUNTER — Inpatient Hospital Stay (HOSPITAL_COMMUNITY): Admission: RE | Admit: 2011-08-24 | Payer: PRIVATE HEALTH INSURANCE | Source: Ambulatory Visit | Admitting: Urology

## 2011-08-24 ENCOUNTER — Other Ambulatory Visit: Payer: Self-pay | Admitting: Urology

## 2011-08-24 ENCOUNTER — Encounter (HOSPITAL_COMMUNITY)
Admission: EM | Disposition: A | Discharge status: Home / Self Care | Payer: Self-pay | Source: Home / Self Care | Attending: Internal Medicine

## 2011-08-24 HISTORY — PX: CYSTOSCOPY/RETROGRADE/URETEROSCOPY: SHX5316

## 2011-08-24 LAB — URINE CULTURE: Colony Count: >=100000

## 2011-08-24 LAB — GLUCOSE, CAPILLARY
Glucose-Capillary: 140 mg/dL — ABNORMAL HIGH (ref 70–99)
Glucose-Capillary: 135 mg/dL — ABNORMAL HIGH (ref 70–99)
Glucose-Capillary: 97 mg/dL (ref 70–99)
Glucose-Capillary: 84 mg/dL (ref 70–99)
Glucose-Capillary: 148 mg/dL — ABNORMAL HIGH (ref 70–99)

## 2011-08-24 LAB — SURGICAL PCR SCREEN
MRSA, PCR: NEGATIVE
Staphylococcus aureus: NEGATIVE

## 2011-08-24 SURGERY — CYSTOSCOPY/RETROGRADE/URETEROSCOPY
Anesthesia: General | Site: Ureter | Laterality: Right | Wound class: Clean Contaminated

## 2011-08-24 MED ORDER — LACTATED RINGERS IV SOLN
INTRAVENOUS | Status: DC | PRN
  Administered 2011-08-24: 15:00:00 via INTRAVENOUS

## 2011-08-24 MED ORDER — IOHEXOL 300 MG/ML  SOLN
INTRAMUSCULAR | Status: DC | PRN
  Administered 2011-08-24: 10 mL

## 2011-08-24 MED ORDER — FENTANYL CITRATE 0.05 MG/ML IJ SOLN
INTRAMUSCULAR | Status: DC | PRN
  Administered 2011-08-24 (×3): 25 ug via INTRAVENOUS

## 2011-08-24 MED ORDER — MIDAZOLAM HCL 5 MG/5ML IJ SOLN
INTRAMUSCULAR | Status: DC | PRN
  Administered 2011-08-24: 2 mg via INTRAVENOUS

## 2011-08-24 MED ORDER — PROMETHAZINE HCL 25 MG/ML IJ SOLN: 6.2500 mg | INTRAMUSCULAR | Status: DC | PRN

## 2011-08-24 MED ORDER — ONDANSETRON HCL 4 MG/2ML IJ SOLN
INTRAMUSCULAR | Status: DC | PRN
  Administered 2011-08-24: 4 mg via INTRAVENOUS

## 2011-08-24 MED ORDER — STERILE WATER FOR IRRIGATION IR SOLN
Status: DC | PRN
  Administered 2011-08-24: 3000 mL

## 2011-08-24 MED ORDER — METOCLOPRAMIDE HCL 5 MG/ML IJ SOLN
INTRAMUSCULAR | Status: DC | PRN
  Administered 2011-08-24: 10 mg via INTRAVENOUS

## 2011-08-24 MED ORDER — LIDOCAINE HCL 2 % EX GEL
CUTANEOUS | Status: DC | PRN
  Administered 2011-08-24: 1 via URETHRAL

## 2011-08-24 MED ORDER — PROPOFOL 10 MG/ML IV BOLUS
INTRAVENOUS | Status: DC | PRN
  Administered 2011-08-24: 180 mg via INTRAVENOUS

## 2011-08-24 MED ORDER — FENTANYL CITRATE 0.05 MG/ML IJ SOLN: 25.0000 ug | INTRAMUSCULAR | Status: DC | PRN

## 2011-08-24 MED ORDER — LIDOCAINE HCL (CARDIAC) 20 MG/ML IV SOLN
INTRAVENOUS | Status: DC | PRN
  Administered 2011-08-24: 100 mg via INTRAVENOUS

## 2011-08-24 SURGICAL SUPPLY — 20 items
ADAPTER CATH URET PLST 4-6FR (CATHETERS) IMPLANT
BAG URO CATCHER STRL LF (DRAPE) ×2 IMPLANT
BASKET ZERO TIP NITINOL 2.4FR (BASKET) IMPLANT
CATH INTERMIT  6FR 70CM (CATHETERS) IMPLANT
CATH URET 5FR 28IN OPEN ENDED (CATHETERS) ×2 IMPLANT
CLOTH BEACON ORANGE TIMEOUT ST (SAFETY) ×2 IMPLANT
DRAPE CAMERA CLOSED 9X96 (DRAPES) ×2 IMPLANT
GLOVE BIOGEL PI IND STRL 7.5 (GLOVE) ×1 IMPLANT
GLOVE BIOGEL PI INDICATOR 7.5 (GLOVE) ×1
GLOVE ECLIPSE 7.0 STRL STRAW (GLOVE) ×2 IMPLANT
GLOVE ECLIPSE 7.5 STRL STRAW (GLOVE) IMPLANT
GOWN PREVENTION PLUS XLARGE (GOWN DISPOSABLE) ×2 IMPLANT
GOWN STRL NON-REIN LRG LVL3 (GOWN DISPOSABLE) IMPLANT
GOWN STRL REIN XL XLG (GOWN DISPOSABLE) ×6 IMPLANT
GUIDEWIRE ANG ZIPWIRE 038X150 (WIRE) ×2 IMPLANT
GUIDEWIRE STR DUAL SENSOR (WIRE) ×2 IMPLANT
MANIFOLD NEPTUNE II (INSTRUMENTS) ×2 IMPLANT
PACK CYSTO (CUSTOM PROCEDURE TRAY) ×2 IMPLANT
STENT PERCUFLEX 4.8FRX24 (STENTS) ×2 IMPLANT
TUBING CONNECTING 10 (TUBING) ×2 IMPLANT

## 2011-08-24 NOTE — Progress Notes (Addendum)
[facility] PROGRESS NOTE  [name] ZRA:[id number] DOB: [birth year] DOA: 08/21/2011 PCP: Philis Fendt, MD  Assessment/Plan: Principal Problem:  *Pyelonephritis Active Problems:  DIABETES MELLITUS, TYPE II  HYPERTENSION  MYOCARDIAL INFARCTION, HX OF  Nephrolithiasis  Nephrolithiasis   #1 acute pyelonephritis: some hydronephrosis on the repeat CT on the right On Rocephin Urinalysis has been done and culture shows Escherichia coli. Treated urine culture showed Escherichia coli, Consulted urology   there is no signs of sepsis, she needs cystoscopy with right retrograde pyelogram and possible ureteroscopy and stenting  She is also diabetic which make some more susceptible to infection. Blood cultures have been obtained and we'll follow results as well.  #2 diabetes: Patient is on insulin with sliding scale insulin and glipizide. I'll continue with that.  #3 hypertension: Stable blood pressure continue home meds monitor. Next  #4 hyperlipidemia: Continue as is startin  #5 nephrolithiasis: This is chronic and nonobstructive. Continue antibiotics for now pain control and if symptoms persist get urology involved. Started Flomax  #6 history of CVA patient is on Plavix which has been discontinued in anticipation of her procedure, her Lovenox has also been held #7 coronary artery disease this was in the setting of stroke in 2003 no history of cardiac stenting   Code Status: full Family Communication: family updated about patient's clinical progress Disposition Plan:  Discussed with Dr. Dyann Kief, patient being transferred to [facility] for anticipated procedure      Brief narrative: -year-old [place] American female with prior history of infected kidney stones as well as diabetes among other things who came into the emergency room with 2 days history of left flank pain nausea and an episode of vomiting.     Consultants:  Urology  Procedures:  Ending  Antibiotics: Rocephin   HPI/Subjective: Feeling clammy, still has the pain in her right flank  Objective: Filed Vitals:   08/23/11 0557 08/23/11 1400 08/23/11 2159 08/24/11 0540  BP: 137/73 125/85 127/82 128/82  Pulse: 92 87 81 82  Temp: 98.8 F (37.1 C) 97.4 F (36.3 C) 97.5 F (36.4 C) 97.7 F (36.5 C)  TempSrc: Oral  Oral   Resp: 19 18 17 18   Height:      Weight:      SpO2: 98% 99% 100% 99%    Intake/Output Summary (Last 24 hours) at 08/24/11 0918 Last data filed at 08/24/11 0547  Gross per 24 hour  Intake    360 ml  Output   1200 ml  Net   -840 ml    Exam:  HENT:  Head: Atraumatic.  Nose: Nose normal.  Mouth/Throat: Oropharynx is clear and moist.  Eyes: Conjunctivae are normal. Pupils are equal, round, and reactive to light. No scleral icterus.  Neck: Neck supple. No tracheal deviation present.  Cardiovascular: Normal rate, regular rhythm, normal heart sounds and intact distal pulses.  Pulmonary/Chest: Effort normal and breath sounds normal. No respiratory distress.  Abdominal: Soft. Normal appearance and bowel sounds are normal. She exhibits no distension. There is no tenderness.  Musculoskeletal: She exhibits no edema and no tenderness.  Neurological: She is alert. No cranial nerve deficit.    Data Reviewed: Basic Metabolic Panel:  Lab 54/56/25 0003 08/21/11 1802  NA 139 137  K 3.5 3.3*  CL 107 104  CO2 23 20  GLUCOSE 179* 262*  BUN 9 8  CREATININE 0.90 0.79  CALCIUM 8.6 9.1  MG -- --  PHOS -- --    Liver Function Tests:  Lab  08/23/11 0003 08/21/11 2358  AST 9 10  ALT 8 10  ALKPHOS 89 104  BILITOT 0.2* 0.4  PROT 6.8 8.0  ALBUMIN 2.5* 3.3*    Lab 08/21/11 2358  LIPASE 16  AMYLASE --   No results found for this basename: AMMONIA:5 in the last 168 hours  CBC:  Lab 08/23/11 0003 08/21/11 1802  WBC 8.9 17.9*  NEUTROABS -- 14.3*  HGB 10.1* 12.0  HCT 31.3* 36.4  MCV 90.7  88.8  PLT 228 258    Cardiac Enzymes: No results found for this basename: CKTOTAL:5,CKMB:5,CKMBINDEX:5,TROPONINI:5 in the last 168 hours BNP (last 3 results) No results found for this basename: PROBNP:3 in the last 8760 hours   CBG:  Lab 08/24/11 0752 08/23/11 2156 08/23/11 1707 08/23/11 1219 08/23/11 0839  GLUCAP 140* 116* 180* 163* 137*    Recent Results (from the past 240 hour(s))  URINE CULTURE     Status: Normal   Collection Time   08/21/11  7:09 PM      Component Value Range Status Comment   Specimen Description URINE, CLEAN CATCH   Final    Special Requests IR:CVELF ON [id number] @2359    Final    Culture  Setup Time 08/22/2011 00:47   Final    Colony Count >=100,000 COLONIES/ML   Final    Culture ESCHERICHIA COLI   Final    Report Status 08/24/2011 FINAL   Final    Organism ID, Bacteria ESCHERICHIA COLI   Final   CULTURE, BLOOD (ROUTINE X 2)     Status: Normal (Preliminary result)   Collection Time   08/21/11 11:50 PM      Component Value Range Status Comment   Specimen Description BLOOD RIGHT ARM   Final    Special Requests BOTTLES DRAWN AEROBIC AND ANAEROBIC 10CC EACH   Final    Culture  Setup Time 08/22/2011 09:07   Final    Culture     Final    Value:        BLOOD CULTURE RECEIVED NO GROWTH TO DATE CULTURE WILL BE HELD FOR 5 DAYS BEFORE ISSUING A FINAL NEGATIVE REPORT   Report Status PENDING   Incomplete   CULTURE, BLOOD (ROUTINE X 2)     Status: Normal (Preliminary result)   Collection Time   08/21/11 11:55 PM      Component Value Range Status Comment   Specimen Description BLOOD RIGHT HAND   Final    Special Requests BOTTLES DRAWN AEROBIC AND ANAEROBIC 5CC EACH   Final    Culture  Setup Time 08/22/2011 09:07   Final    Culture     Final    Value:        BLOOD CULTURE RECEIVED NO GROWTH TO DATE CULTURE WILL BE HELD FOR 5 DAYS BEFORE ISSUING A FINAL NEGATIVE REPORT   Report Status PENDING   Incomplete   URINE CULTURE     Status: Normal   Collection Time   08/22/11   9:47 AM      Component Value Range Status Comment   Specimen Description URINE, CLEAN CATCH   Final    Special Requests NONE   Final    Culture  Setup Time 08/22/2011 14:22   Final    Colony Count 25,000 COLONIES/ML   Final    Culture     Final    Value: Multiple bacterial morphotypes present, none predominant. Suggest appropriate recollection if clinically indicated.   Report Status 08/23/2011 FINAL   Final  Studies: Ct Abdomen Pelvis Wo Contrast  08/22/2011  *RADIOLOGY REPORT*  Clinical Data: 46 year old female - right flank, abdominal and pelvic pain with hematuria.  CT ABDOMEN AND PELVIS WITHOUT CONTRAST  Technique:  Multidetector CT imaging of the abdomen and pelvis was performed following the standard protocol without intravenous contrast.  Comparison: 05/27/2011 and prior CTs  Findings: The liver, spleen, gallbladder, pancreas and adrenal glands are unremarkable except for a stable 1.5 x 2 cm right adrenal adenoma.  Multiple nonobstructing bilateral renal calculi are noted, all measuring 4-5 mm or less. There is mild right perinephric stranding, but no evidence of hydronephrosis or ureteral calculi.  Please note that parenchymal abnormalities may be missed as intravenous contrast was not administered. No free fluid, enlarged lymph nodes, biliary dilation or abdominal aortic aneurysm identified. The bowel, appendix and bladder are unremarkable. The patient is status post hysterectomy.  No acute or suspicious bony abnormalities are identified.  IMPRESSION: Nonspecific right perinephric inflammation without hydronephrosis or ureteral calculus. This could represent right renal inflammation/infection or a passed calculus.  Multiple bilateral nonobstructing renal calculi.  Right adrenal adenoma.  Original Report Authenticated By: Lura Em, M.D.   Ct Abdomen Pelvis W Contrast  08/23/2011  *RADIOLOGY REPORT*  Clinical Data: Hematuria and right flank pain.  CT ABDOMEN AND PELVIS WITH CONTRAST   Technique:  Multidetector CT imaging of the abdomen and pelvis was performed following the standard protocol during bolus administration of intravenous contrast.  Contrast: 168m OMNIPAQUE IOHEXOL 300 MG/ML  SOLN  Comparison: 08/22/2011  Findings: Slight fibrosis or atelectasis in the lung bases. Minimal bilateral pleural effusions.  Since the previous study, there is increasing right-sided pyelocaliectasis and ureterectasis.  The wall of the ureter appears thickened and is enhancing.  No ureteral stones are demonstrated. Changes could represent obstruction due to occult stone, stricture, or pyelonephritis. Delayed views demonstrate a focal area of thickening in the mid right ureter at the level of L4 with loss of the urine column.  This could represent ureteral mass or focal peristalsis.  Consider retrograde pyelography if clinically indicated.  There is mild periureteral stranding.  Multiple intrarenal stones bilaterally are better visualized on the previous unenhanced scan. The left ureter and collecting system are decompressed.  Delayed views of the bladder demonstrate normal bladder filling without wall thickening or filling defect.  The liver, spleen, gallbladder, pancreas, left adrenal gland, abdominal aorta, and retroperitoneal lymph nodes are unremarkable. 2.1 cm nodule in the right adrenal gland.  This demonstrates a fat density on the unenhanced study obtained previously, consistent with an adenoma.  The stomach, small bowel, and colon are not abnormally distended.  Infiltration and gas focally in the anterior abdominal wall fat likely represents a site of injection.  No free air or free fluid in the abdomen.  Pelvis:  Uterus appears to be surgically absent.  Adnexal structures are not abnormally enlarged.  Suggestion of involuting cyst in the right ovary.  No free or loculated pelvic fluid collections.  No evidence of diverticulitis.  The appendix is normal.  Normal alignment of the lumbar vertebrae.   IMPRESSION: Increasing right-sided pyelocaliectasis and ureterectasis without stones visualized.  Possibility of focal obstructing lesion or thickening in the mid right ureter.  Changes may represent occult stone, stricture, or mass.  Consider retrograde ureter gram for further evaluation if clinically indicated.  Original Report Authenticated By: WNeale Burly M.D.    Scheduled Meds:   . baclofen  10 mg Oral TID  . cefTRIAXone (ROCEPHIN)  IV  1 g Intravenous Q24H  . clopidogrel  75 mg Oral Daily  . enoxaparin  40 mg Subcutaneous Daily  . famotidine  10 mg Oral Daily  . gabapentin  300 mg Oral TID  . glipiZIDE  20 mg Oral Daily  . insulin aspart  0-15 Units Subcutaneous TID WC  . insulin aspart  0-5 Units Subcutaneous QHS  . insulin detemir  30 Units Subcutaneous QHS  . insulin detemir  40 Units Subcutaneous Daily  . psyllium  1 packet Oral BID  . Tamsulosin HCl  0.4 mg Oral QPC breakfast  . topiramate  200 mg Oral BID  . traZODone  150 mg Oral QHS  . DISCONTD: psyllium  1 packet Oral Daily   Continuous Infusions:   . sodium chloride 100 mL/hr at 08/24/11 8185    Principal Problem:  *Pyelonephritis Active Problems:  DIABETES MELLITUS, TYPE II  HYPERTENSION  MYOCARDIAL INFARCTION, HX OF  Nephrolithiasis    Time spent: 40 minutes   [facility] Pager [phone]. If 8PM-8AM, please contact night-coverage at [email or web address], password [facility] 08/24/2011, 9:18 AM  LOS: 3 days

## 2011-08-24 NOTE — Progress Notes (Signed)
Patient ID: [name], female   DOB: Dec 12, 1965, 46 y.o.   MRN: [id number]    Subjective: [name] continues to have moderately severe pain with nausea.   A CT with IV contrast now shows some right hydronephrosis and an area in the proximal ureter that is narrowed and of uncertain significance.  This could be clot, inflammation or stricture.  I would think tumor is less likely. ROS: Negative except nausea.  Objective: Vital signs in last 24 hours: Temp:  [97.4 F (36.3 C)-97.7 F (36.5 C)] 97.7 F (36.5 C) (08/05 0540) Pulse Rate:  [81-87] 82  (08/05 0540) Resp:  [17-18] 18  (08/05 0540) BP: (125-128)/(82-85) 128/82 mmHg (08/05 0540) SpO2:  [99 %-100 %] 99 % (08/05 0540)  Intake/Output from previous day: 08/04 0701 - 08/05 0700 In: 720 [P.O.:720] Out: 1500 [Urine:1500] Intake/Output this shift:    General appearance: alert and no distress GI: Soft, obese with persistent RCVA tenderness.  Lab Results:   Basename 08/23/11 0003 08/21/11 1802  WBC 8.9 17.9*  HGB 10.1* 12.0  HCT 31.3* 36.4  PLT 228 258   BMET  Basename 08/23/11 0003 08/21/11 1802  NA 139 137  K 3.5 3.3*  CL 107 104  CO2 23 20  GLUCOSE 179* 262*  BUN 9 8  CREATININE 0.90 0.79  CALCIUM 8.6 9.1   PT/INR No results found for this basename: LABPROT:2,INR:2 in the last 72 hours ABG No results found for this basename: PHART:2,PCO2:2,PO2:2,HCO3:2 in the last 72 hours  Studies/Results: Ct Abdomen Pelvis W Contrast  08/23/2011  *RADIOLOGY REPORT*  Clinical Data: Hematuria and right flank pain.  CT ABDOMEN AND PELVIS WITH CONTRAST  Technique:  Multidetector CT imaging of the abdomen and pelvis was performed following the standard protocol during bolus administration of intravenous contrast.  Contrast: 124m OMNIPAQUE IOHEXOL 300 MG/ML  SOLN  Comparison: 08/22/2011  Findings: Slight fibrosis or atelectasis in the lung bases. Minimal bilateral pleural effusions.  Since the previous study, there is increasing  right-sided pyelocaliectasis and ureterectasis.  The wall of the ureter appears thickened and is enhancing.  No ureteral stones are demonstrated. Changes could represent obstruction due to occult stone, stricture, or pyelonephritis. Delayed views demonstrate a focal area of thickening in the mid right ureter at the level of L4 with loss of the urine column.  This could represent ureteral mass or focal peristalsis.  Consider retrograde pyelography if clinically indicated.  There is mild periureteral stranding.  Multiple intrarenal stones bilaterally are better visualized on the previous unenhanced scan. The left ureter and collecting system are decompressed.  Delayed views of the bladder demonstrate normal bladder filling without wall thickening or filling defect.  The liver, spleen, gallbladder, pancreas, left adrenal gland, abdominal aorta, and retroperitoneal lymph nodes are unremarkable. 2.1 cm nodule in the right adrenal gland.  This demonstrates a fat density on the unenhanced study obtained previously, consistent with an adenoma.  The stomach, small bowel, and colon are not abnormally distended.  Infiltration and gas focally in the anterior abdominal wall fat likely represents a site of injection.  No free air or free fluid in the abdomen.  Pelvis:  Uterus appears to be surgically absent.  Adnexal structures are not abnormally enlarged.  Suggestion of involuting cyst in the right ovary.  No free or loculated pelvic fluid collections.  No evidence of diverticulitis.  The appendix is normal.  Normal alignment of the lumbar vertebrae.  IMPRESSION: Increasing right-sided pyelocaliectasis and ureterectasis without stones visualized.  Possibility of  focal obstructing lesion or thickening in the mid right ureter.  Changes may represent occult stone, stricture, or mass.  Consider retrograde ureter gram for further evaluation if clinically indicated.  Original Report Authenticated By: Neale Burly, M.D.     Anti-infectives: Anti-infectives     Start     Dose/Rate Route Frequency Ordered Stop   08/22/11 2300   cefTRIAXone (ROCEPHIN) 1 g in dextrose 5 % 50 mL IVPB        1 g 100 mL/hr over 30 Minutes Intravenous Every 24 hours 08/22/11 0814     08/22/11 0700   cefTRIAXone (ROCEPHIN) 1 g in dextrose 5 % 50 mL IVPB  Status:  Discontinued        1 g 100 mL/hr over 30 Minutes Intravenous Every 24 hours 08/22/11 0649 08/22/11 0814   08/21/11 2345   cefTRIAXone (ROCEPHIN) 1 g in dextrose 5 % 50 mL IVPB        1 g 100 mL/hr over 30 Minutes Intravenous  Once 08/21/11 2331 08/22/11 0033          Current Facility-Administered Medications  Medication Dose Route Frequency Provider Last Rate Last Dose  . 0.9 %  sodium chloride infusion   Intravenous Continuous Reyne Dumas, MD 100 mL/hr at 08/24/11 0634    . acetaminophen (TYLENOL) tablet 650 mg  650 mg Oral Q6H PRN Elwyn Reach, MD   650 mg at 08/23/11 1401   Or  . acetaminophen (TYLENOL) suppository 650 mg  650 mg Rectal Q6H PRN Elwyn Reach, MD      . baclofen (LIORESAL) tablet 10 mg  10 mg Oral TID Elwyn Reach, MD   10 mg at 08/23/11 2216  . cefTRIAXone (ROCEPHIN) 1 g in dextrose 5 % 50 mL IVPB  1 g Intravenous Q24H Elwyn Reach, MD   1 g at 08/24/11 0100  . clopidogrel (PLAVIX) tablet 75 mg  75 mg Oral Daily Elwyn Reach, MD   75 mg at 08/23/11 0944  . enoxaparin (LOVENOX) injection 40 mg  40 mg Subcutaneous Daily Reyne Dumas, MD   40 mg at 08/23/11 1238  . famotidine (PEPCID) tablet 10 mg  10 mg Oral Daily Elwyn Reach, MD   10 mg at 08/23/11 0944  . gabapentin (NEURONTIN) capsule 300 mg  300 mg Oral TID Elwyn Reach, MD   300 mg at 08/23/11 2216  . glipiZIDE (GLUCOTROL XL) 24 hr tablet 20 mg  20 mg Oral Daily Elwyn Reach, MD   20 mg at 08/23/11 0944  . insulin aspart (novoLOG) injection 0-15 Units  0-15 Units Subcutaneous TID WC Elwyn Reach, MD   3 Units at 08/23/11 1712  . insulin aspart (novoLOG)  injection 0-5 Units  0-5 Units Subcutaneous QHS Elwyn Reach, MD      . insulin detemir (LEVEMIR) injection 30 Units  30 Units Subcutaneous QHS Elwyn Reach, MD   30 Units at 08/23/11 2216  . insulin detemir (LEVEMIR) injection 40 Units  40 Units Subcutaneous Daily Elwyn Reach, MD   40 Units at 08/23/11 0944  . iohexol (OMNIPAQUE) 300 MG/ML solution 125 mL  125 mL Intravenous Once PRN Medication Radiologist, MD   125 mL at 08/23/11 1843  . morphine 2 MG/ML injection 2 mg  2 mg Intravenous Q4H PRN Elwyn Reach, MD   2 mg at 08/24/11 0354  . ondansetron (ZOFRAN) tablet 4 mg  4 mg Oral Q6H PRN Mohammad L  Jonelle Sidle, MD       Or  . ondansetron [facility]) injection 4 mg  4 mg Intravenous Q6H PRN Elwyn Reach, MD      . psyllium (HYDROCIL/METAMUCIL) packet 1 packet  1 packet Oral BID Reyne Dumas, MD   1 packet at 08/23/11 2217  . Tamsulosin HCl (FLOMAX) capsule 0.4 mg  0.4 mg Oral QPC breakfast Reyne Dumas, MD   0.4 mg at 08/23/11 0944  . topiramate (TOPAMAX) tablet 200 mg  200 mg Oral BID Elwyn Reach, MD   200 mg at 08/23/11 2216  . traZODone (DESYREL) tablet 150 mg  150 mg Oral QHS Elwyn Reach, MD   150 mg at 08/23/11 2216  . DISCONTD: psyllium (HYDROCIL/METAMUCIL) packet 1 packet  1 packet Oral Daily Reyne Dumas, MD        Assessment: She has some hydronephrosis on the repeat CT on the right and continues to have pain but is afebrile.   Plan: She needs cystoscopy with right retrograde pyelogram and possible ureteroscopy and stenting.  I have reviewed the risks of bleeding, infection, ureteral injury, thrombotic events and anesthetic complications.   I will set this up to be done at [facility] for this and if she could be transferred to the Hospitalist service at [facility] after this it would be most convenient.   LOS: 3 days    Briceida Rasberry J 08/24/2011

## 2011-08-24 NOTE — Anesthesia Preprocedure Evaluation (Signed)
Anesthesia Evaluation  Patient identified by MRN, date of birth, ID band Patient awake    Reviewed: Allergy & Precautions, H&P , NPO status , Patient's Chart, lab work & pertinent test results, reviewed documented beta blocker date and time   Airway Mallampati: II TM Distance: >3 FB Neck ROM: full    Dental No notable dental hx.    Pulmonary asthma , sleep apnea , COPD Mild OSA, no CPAP breath sounds clear to auscultation  Pulmonary exam normal       Cardiovascular Exercise Tolerance: Good hypertension, + Past MI + dysrhythmias + Valvular Problems/Murmurs Rhythm:regular Rate:Normal  Last MI 2003   Neuro/Psych  Headaches, Seizures -,  PSYCHIATRIC DISORDERS CVA, Residual Symptoms    GI/Hepatic Neg liver ROS, GERD-  Medicated,  Endo/Other  negative endocrine ROSType 2, Oral Hypoglycemic Agents and Insulin Dependent  Renal/GU negative Renal ROS  negative genitourinary   Musculoskeletal   Abdominal   Peds  Hematology negative hematology ROS (+)   Anesthesia Other Findings   Reproductive/Obstetrics negative OB ROS                           Anesthesia Physical Anesthesia Plan  ASA: II  Anesthesia Plan: General   Post-op Pain Management:    Induction:   Airway Management Planned:   Additional Equipment:   Intra-op Plan:   Post-operative Plan:   Informed Consent: I have reviewed the patients History and Physical, chart, labs and discussed the procedure including the risks, benefits and alternatives for the proposed anesthesia with the patient or authorized representative who has indicated his/her understanding and acceptance.   Dental Advisory Given  Plan Discussed with: CRNA  Anesthesia Plan Comments:         Anesthesia Quick Evaluation

## 2011-08-24 NOTE — Progress Notes (Signed)
Urology Progress Note  Subjective:     Patient with right hydronephrosis and narrowing of ureter at approximately the level of L4. She had a dose of plavix yesterday. Today, we discussed going to the OR for cystoscopy, right retrograde pyelogram, and right ureter stent placement.  I do not recommend biopsy while she is on plavix.  We discussed the indications, risks, benefits, alternatives, and likelihood of achieving her goals.  ROS: Negative: SOB, chest pain.  Objective:  Patient Vitals for the past 24 hrs:  BP Temp Temp src Pulse Resp SpO2  08/24/11 0540 128/82 mmHg 97.7 F (36.5 C) - 82  18  99 %  08/23/11 2159 127/82 mmHg 97.5 F (36.4 C) Oral 81  17  100 %    Physical Exam: General:  No acute distress, awake Cardiovascular:    [x]   S1/S2 present, RRR  []   Irregularly irregular Chest:  CTA-B Abdomen:               []  Soft, appropriately TTP  [x]  Soft, NTTP  []  Soft, appropriately TTP, incision(s) clean/dry/intact  Genitourinary: No catheter in place.    I/O last 3 completed shifts: In: 720 [P.O.:720] Out: 3200 [Urine:3200]  Recent Labs  [facility] 08/23/11 0003 08/21/11 1802   HGB 10.1* 12.0   WBC 8.9 17.9*   PLT 228 258    Recent Labs  Basename 08/23/11 0003 08/21/11 1802   NA 139 137   K 3.5 3.3*   CL 107 104   CO2 23 20   BUN 9 8   CREATININE 0.90 0.79   CALCIUM 8.6 9.1   GFRNONAA 76* >90   GFRAA 88* >90     No results found for this basename: PT:2,INR:2,APTT:2 in the last 72 hours   No components found with this basename: ABG:2    Length of stay: 3 days.  Assessment: Right hydronephrosis. Right flank pain. Narrowing of right ureter.  Plan: -To OR for cystoscopy, right retrograde pyelogram, right ureter stent placement.   Rolan Bucco, MD [phone]

## 2011-08-24 NOTE — Progress Notes (Signed)
Patient transferred to [facility] as ordered, transported by care link, family aware.

## 2011-08-24 NOTE — Brief Op Note (Signed)
08/21/2011 - 08/24/2011  3:27 PM  PATIENT:  Andres Shad Herbin  46 y.o. female  PRE-OPERATIVE DIAGNOSIS:  Right ureteral stone  POST-OPERATIVE DIAGNOSIS:  Right ureteral stone  PROCEDURE:  Procedure(s) (LRB): CYSTOSCOPY/RETROGRADE/URETEROSCOPY (Right) Right ureter stent placement  SURGEON:  Surgeon(s) and Role:    * Molli Hazard, MD - Primary  PHYSICIAN ASSISTANT:   ASSISTANTS: none   ANESTHESIA:   general  EBL:  Total I/O In: 0  Out: 250 [Urine:250]  BLOOD ADMINISTERED:none  DRAINS: none   LOCAL MEDICATIONS USED:  LIDOCAINE  and Amount: 10 ml urethral jelly.  SPECIMEN:  No Specimen  DISPOSITION OF SPECIMEN:  N/A  COUNTS:  YES  TOURNIQUET:  * No tourniquets in log *  DICTATION: .Other Dictation: Dictation Number [id number]  PLAN OF CARE: Return to inpatient care under [facility].  PATIENT DISPOSITION:  PACU - hemodynamically stable.   Delay start of Pharmacological VTE agent (>24hrs) due to surgical blood loss or risk of bleeding: no

## 2011-08-24 NOTE — Transfer of Care (Signed)
Immediate Anesthesia Transfer of Care Note  Patient: [name]  Procedure(s) Performed: Procedure(s) (LRB): CYSTOSCOPY/RETROGRADE/URETEROSCOPY (Right)  Patient Location: PACU  Anesthesia Type: General  Level of Consciousness: awake and patient cooperative  Airway & Oxygen Therapy: Patient Spontanous Breathing and Patient connected to face mask oxygen  Post-op Assessment: Report given to PACU RN and Post -op Vital signs reviewed and stable  Post vital signs: Reviewed and stable  Complications: No apparent anesthesia complications

## 2011-08-24 NOTE — Anesthesia Postprocedure Evaluation (Signed)
  Anesthesia Post-op Note  Patient: [name]  Procedure(s) Performed: Procedure(s) (LRB): CYSTOSCOPY/RETROGRADE/URETEROSCOPY (Right)  Patient Location: PACU  Anesthesia Type: General  Level of Consciousness: awake and alert   Airway and Oxygen Therapy: Patient Spontanous Breathing  Post-op Pain: mild  Post-op Assessment: Post-op Vital signs reviewed, Patient's Cardiovascular Status Stable, Respiratory Function Stable, Patent Airway and No signs of Nausea or vomiting  Post-op Vital Signs: stable  Complications: No apparent anesthesia complications

## 2011-08-25 ENCOUNTER — Encounter (HOSPITAL_COMMUNITY): Payer: Self-pay | Admitting: Urology

## 2011-08-25 DIAGNOSIS — E119 Type 2 diabetes mellitus without complications: Secondary | ICD-10-CM

## 2011-08-25 DIAGNOSIS — N3946 Mixed incontinence: Secondary | ICD-10-CM | POA: Diagnosis present

## 2011-08-25 DIAGNOSIS — N135 Crossing vessel and stricture of ureter without hydronephrosis: Secondary | ICD-10-CM | POA: Diagnosis present

## 2011-08-25 DIAGNOSIS — E785 Hyperlipidemia, unspecified: Secondary | ICD-10-CM

## 2011-08-25 LAB — CBC
HCT: 30.7 % — ABNORMAL LOW (ref 36.0–46.0)
Hemoglobin: 9.7 g/dL — ABNORMAL LOW (ref 12.0–15.0)
MCH: 28.4 pg (ref 26.0–34.0)
MCHC: 31.6 g/dL (ref 30.0–36.0)
MCV: 89.8 fL (ref 78.0–100.0)
Platelets: 288 10*3/uL (ref 150–400)
RBC: 3.42 MIL/uL — ABNORMAL LOW (ref 3.87–5.11)
RDW: 14.3 % (ref 11.5–15.5)
WBC: 6.9 10*3/uL (ref 4.0–10.5)

## 2011-08-25 LAB — GLUCOSE, CAPILLARY
Glucose-Capillary: 123 mg/dL — ABNORMAL HIGH (ref 70–99)
Glucose-Capillary: 180 mg/dL — ABNORMAL HIGH (ref 70–99)
Glucose-Capillary: 80 mg/dL (ref 70–99)
Glucose-Capillary: 115 mg/dL — ABNORMAL HIGH (ref 70–99)

## 2011-08-25 MED ORDER — LEVOFLOXACIN 750 MG PO TABS
750.0000 mg | ORAL_TABLET | Freq: Every day | ORAL | Status: DC
  Administered 2011-08-25 – 2011-08-26 (×2): 750 mg via ORAL
  Filled 2011-08-25 (×2): qty 1

## 2011-08-25 MED ORDER — PHENAZOPYRIDINE HCL 200 MG PO TABS
200.0000 mg | ORAL_TABLET | Freq: Three times a day (TID) | ORAL | Status: DC | PRN
Start: 2011-08-25 — End: 2011-08-26
  Administered 2011-08-25: 200 mg via ORAL
  Filled 2011-08-25: qty 1

## 2011-08-25 MED ORDER — OXYBUTYNIN CHLORIDE ER 10 MG PO TB24
10.0000 mg | ORAL_TABLET | Freq: Every day | ORAL | Status: DC
  Administered 2011-08-25: 10 mg via ORAL
  Filled 2011-08-25 (×2): qty 1

## 2011-08-25 MED ORDER — CLOPIDOGREL BISULFATE 75 MG PO TABS
75.0000 mg | ORAL_TABLET | Freq: Every day | ORAL | Status: DC
  Administered 2011-08-26: 75 mg via ORAL
  Filled 2011-08-25 (×2): qty 1

## 2011-08-25 MED ORDER — MORPHINE SULFATE 15 MG PO TABS
15.0000 mg | ORAL_TABLET | Freq: Four times a day (QID) | ORAL | Status: DC | PRN
  Administered 2011-08-25 – 2011-08-26 (×4): 15 mg via ORAL
  Filled 2011-08-25 (×4): qty 1

## 2011-08-25 NOTE — Op Note (Signed)
[name], [name] NO.:  [id number]  MEDICAL RECORD NO.:  [id number]  LOCATION:                               FACILITY:  [facility]  PHYSICIAN:  Rolan Bucco, MD    DATE OF BIRTH:  1965/09/07  DATE OF PROCEDURE:  08/24/2011 DATE OF DISCHARGE:                              OPERATIVE REPORT   SURGEON:  Rolan Bucco, MD  ASSISTANT:  None.  PREOPERATIVE DIAGNOSIS:  Right hydronephrosis with right ureter narrowing.  POSTOPERATIVE DIAGNOSIS:  Right hydronephrosis with right ureter narrowing.  PROCEDURES PERFORMED: 1. Cystoscopy with panendoscopy. 2. Right retrograde pyelogram. 3. Right ureteral stent placement.  FINDINGS:  Narrowing of the mid-to-proximal ureter at approximately at the level of L4 with inability to distended this area with retrograde injection of contrast and difficulty placing a guidewire beyond this area.  COMPLICATIONS:  None.  ESTIMATED BLOOD LOSS:  None.  DRAINS:  None.  HISTORY OF PRESENT ILLNESS:  This is a 46 year old female patient of Dr. Jeffie Pollock who was admitted and found to have gross hematuria.  On workup, she was found to have right-sided hydronephrosis with an area in the right midureter concerning for narrowing.  Upon discovery of this finding, Dr. Jeffie Pollock spoke with the patient about placing ureteral stent to preserve her renal function as well as to try to help workup further causes of this hydronephrosis.  As I was the urologist on-call, he asked if I would proceed with doing the procedure.  I spoke with the patient regarding cystoscopy, right retrograde pyelogram, and right ureteral stent placement.  She had her doses of Plavix up until as soon as yesterday, August 23, 2011, and therefore, I did not feel it was appropriate to perform her ureteroscopy and biopsy.  The patient agreed after I discussed the risks, benefits, alternatives, and likelihood of achieving goals.  PROCEDURE:  After informed consent was obtained,  the patient was taken to the operating room, she was placed in supine position.  IV antibiotics were infused and general anesthesia was induced.  She was then placed in dorsal lithotomy position.  SCDs were in place and turned on prior to induction of anesthetic.  While she was in the dorsal lithotomy position, all pertinent neurovascular pressure points were padded appropriately.  Her genitals were then prepped and draped in usual sterile fashion.  A time-out was performed in which the correct patient, surgical site, and procedure were identified and agreed upon by the team.  Next, I placed a rigid 30-degree cystoscope through the urethra into the bladder.  The bladder was fully distended and I evaluated the bladder in a systematic fashion with a 30-degree and 70- degree lens.  There were no papillary renal tumors.  There were small areas in the bladder consistent with cystitis cystica.  Next, I turned attention to the right ureteral orifice.  This was cannulated with a 5- [place] ureteral catheter and I injected contrast to obtain a retrograde pyelogram.  I noted that there remained narrowing approximately at the level of L4 and I was unable to distinct this area with contrast. Next, I attempted to place a Sensor tip wire past this area, but was unsuccessful.  Then, I used  an angled tip glidewire, which was able to be passed with some manipulation beyond this area.  This was placed up into the left renal pelvis.  A 4.8 x 26 double-J ureteral stent was then placed up over the wire with ease into the right renal pelvis.  The stent was deployed with a good curl in the right renal pelvis and a curl in the bladder on direct visualization.  There was efflux of particulate matter from the stent after this was done.  The bladder was drained and then 10 mL of lidocaine jelly was placed into her urethra.  This completed the procedure.  She has placed back in supine position. Anesthesia was  reversed.  She has taken to the PACU in stable condition.          ______________________________ Rolan Bucco, MD     DW/MEDQ  D:  08/24/2011  T:  08/25/2011  Job:  [id number]

## 2011-08-25 NOTE — Progress Notes (Signed)
[facility] PROGRESS NOTE  [name] XNT:[id number] DOB: Jan 21, 1965 DOA: 08/21/2011 PCP: Karen Fendt, MD  Assessment/Plan: Principal Problem:  *Pyelonephritis Active Problems:  DIABETES MELLITUS, TYPE II  HYPERTENSION  MYOCARDIAL INFARCTION, HX OF  Nephrolithiasis  Mixed urge and stress incontinence  Ureteral stricture, right   #1 acute E. Coli pyelonephritis: some hydronephrosis on the repeat CT on the right. Will transition abx's to levaquin daily and also start PO analgesics today; plan is to continue flomax, oxybutynin and pyridium; she will follow with urology in 2-3 weeks. Will required suppressive antibiotic therapy after she finish acute treatment (6 more days of levaquin); then start trimpex 131m daily until she follow with urology. No fever and just some nausea now. Most likely home 1-2 days once pain better control and tolerating po meds.  #2 diabetes: Continue levemir , sliding scale insulin and glipizide. Well controlled.  #3 hypertension: Stable. Will continue heart healthy diet; no antihypertensive medications needed.  #4 hyperlipidemia: Continue as is statins  #5 nephrolithiasis: Per urology. Continue flomax. Started on oxybutynin  And pyridium.   #6 history of CVA: will resume plavix; no new focal deficit appreciated on exam.  #7 coronary artery disease: no CP, no SOB, Telemetry w/o abnormalities. Will resume plavix and continue monitoring.   Code Status: full Family Communication: family updated about patient's clinical progress Disposition Plan:  Home when medically stable.      Brief narrative: 46year old African American female with prior history of infected kidney stones as well as diabetes among other things who came into the emergency room with 2 days history of right flank pain nausea and an episode of vomiting. Found with pyelonephritis and subsequently develops hydronephrosis with stricture on her right ureter. Patient s/p right  pyelography with right stent placement.   Consultants:  Urology  Procedures:  Retrograde pyelography  With right ureter stent placement  Antibiotics: Levaquin (received rocephin for 4 days)   HPI/Subjective: Slightly better; but still with significant pain affecting her RLQ; also with nausea but no vomiting. Afebrile.  Objective: Filed Vitals:   08/24/11 2054 08/25/11 0203 08/25/11 0435 08/25/11 1019  BP: 117/75 120/75 99/67 124/86  Pulse: 78 82 82 77  Temp: 98.1 F (36.7 C) 98.4 F (36.9 C) 97.7 F (36.5 C) 97.8 F (36.6 C)  TempSrc: Oral Oral Oral Oral  Resp: 19 20 20 20   Height:      Weight:      SpO2: 99% 94% 96% 97%    Intake/Output Summary (Last 24 hours) at 08/25/11 1201 Last data filed at 08/25/11 1157  Gross per 24 hour  Intake   2790 ml  Output   1700 ml  Net   1090 ml    Exam:  HENT:  Head: Atraumatic.  Nose: Nose normal.  Mouth/Throat: Oropharynx is clear and moist.  Eyes: Conjunctivae are normal. Pupils are equal, round, and reactive to light. No scleral icterus.  Neck: Neck supple. No tracheal deviation present.  Cardiovascular: Normal rate, regular rhythm, normal heart sounds and intact distal pulses.  Pulmonary/Chest: Effort normal and breath sounds normal. No respiratory distress.  Abdominal: Soft. Normal appearance and bowel sounds are normal. She exhibits no distension. Tender to palpation on her RLQ.  Musculoskeletal: She exhibits no edema and no tenderness.  Neurological: She is alert. No cranial nerve deficit.    Data Reviewed: Basic Metabolic Panel:  Lab 096/[id number] 08/21/11 1802  NA 139 137  K 3.5 3.3*  CL 107 104  CO2 23 20  GLUCOSE 179* 262*  BUN 9 8  CREATININE 0.90 0.79  CALCIUM 8.6 9.1  MG -- --  PHOS -- --    Liver Function Tests:  Lab 08/23/11 0003 08/21/11 2358  AST 9 10  ALT 8 10  ALKPHOS 89 104  BILITOT 0.2* 0.4  PROT 6.8 8.0  ALBUMIN 2.5* 3.3*    Lab 08/21/11 2358  LIPASE 16  AMYLASE --     CBC:  Lab 08/25/11 0403 08/23/11 0003 08/21/11 1802  WBC 6.9 8.9 17.9*  NEUTROABS -- -- 14.3*  HGB 9.7* 10.1* 12.0  HCT 30.7* 31.3* 36.4  MCV 89.8 90.7 88.8  PLT 288 228 258      CBG:  Lab 08/25/11 1137 08/25/11 0737 08/24/11 2048 08/24/11 1812 08/24/11 1553  GLUCAP 180* 123* 148* 84 97    Recent Results (from the past 240 hour(s))  URINE CULTURE     Status: Normal   Collection Time   08/21/11  7:09 PM      Component Value Range Status Comment   Specimen Description URINE, CLEAN CATCH   Final    Special Requests JA:SNKNL ON [id number] @2359    Final    Culture  Setup Time 08/22/2011 00:47   Final    Colony Count >=100,000 COLONIES/ML   Final    Culture ESCHERICHIA COLI   Final    Report Status 08/24/2011 FINAL   Final    Organism ID, Bacteria ESCHERICHIA COLI   Final   CULTURE, BLOOD (ROUTINE X 2)     Status: Normal (Preliminary result)   Collection Time   08/21/11 11:50 PM      Component Value Range Status Comment   Specimen Description BLOOD RIGHT ARM   Final    Special Requests BOTTLES DRAWN AEROBIC AND ANAEROBIC 10CC EACH   Final    Culture  Setup Time 08/22/2011 09:07   Final    Culture     Final    Value:        BLOOD CULTURE RECEIVED NO GROWTH TO DATE CULTURE WILL BE HELD FOR 5 DAYS BEFORE ISSUING A FINAL NEGATIVE REPORT   Report Status PENDING   Incomplete   CULTURE, BLOOD (ROUTINE X 2)     Status: Normal (Preliminary result)   Collection Time   08/21/11 11:55 PM      Component Value Range Status Comment   Specimen Description BLOOD RIGHT HAND   Final    Special Requests BOTTLES DRAWN AEROBIC AND ANAEROBIC 5CC EACH   Final    Culture  Setup Time 08/22/2011 09:07   Final    Culture     Final    Value:        BLOOD CULTURE RECEIVED NO GROWTH TO DATE CULTURE WILL BE HELD FOR 5 DAYS BEFORE ISSUING A FINAL NEGATIVE REPORT   Report Status PENDING   Incomplete   URINE CULTURE     Status: Normal   Collection Time   08/22/11  9:47 AM      Component Value Range Status  Comment   Specimen Description URINE, CLEAN CATCH   Final    Special Requests NONE   Final    Culture  Setup Time 08/22/2011 14:22   Final    Colony Count 25,000 COLONIES/ML   Final    Culture     Final    Value: Multiple bacterial morphotypes present, none predominant. Suggest appropriate recollection if clinically indicated.   Report Status 08/23/2011 FINAL   Final   SURGICAL PCR  SCREEN     Status: Normal   Collection Time   08/24/11 11:24 AM      Component Value Range Status Comment   MRSA, PCR NEGATIVE  NEGATIVE Final    Staphylococcus aureus NEGATIVE  NEGATIVE Final      Studies: Ct Abdomen Pelvis Wo Contrast  08/22/2011  *RADIOLOGY REPORT*  Clinical Data: 46 year old female - right flank, abdominal and pelvic pain with hematuria.  CT ABDOMEN AND PELVIS WITHOUT CONTRAST  Technique:  Multidetector CT imaging of the abdomen and pelvis was performed following the standard protocol without intravenous contrast.  Comparison: 05/27/2011 and prior CTs  Findings: The liver, spleen, gallbladder, pancreas and adrenal glands are unremarkable except for a stable 1.5 x 2 cm right adrenal adenoma.  Multiple nonobstructing bilateral renal calculi are noted, all measuring 4-5 mm or less. There is mild right perinephric stranding, but no evidence of hydronephrosis or ureteral calculi.  Please note that parenchymal abnormalities may be missed as intravenous contrast was not administered. No free fluid, enlarged lymph nodes, biliary dilation or abdominal aortic aneurysm identified. The bowel, appendix and bladder are unremarkable. The patient is status post hysterectomy.  No acute or suspicious bony abnormalities are identified.  IMPRESSION: Nonspecific right perinephric inflammation without hydronephrosis or ureteral calculus. This could represent right renal inflammation/infection or a passed calculus.  Multiple bilateral nonobstructing renal calculi.  Right adrenal adenoma.  Original Report Authenticated By:  Lura Em, M.D.   Ct Abdomen Pelvis W Contrast  08/23/2011  *RADIOLOGY REPORT*  Clinical Data: Hematuria and right flank pain.  CT ABDOMEN AND PELVIS WITH CONTRAST  Technique:  Multidetector CT imaging of the abdomen and pelvis was performed following the standard protocol during bolus administration of intravenous contrast.  Contrast: 160m OMNIPAQUE IOHEXOL 300 MG/ML  SOLN  Comparison: 08/22/2011  Findings: Slight fibrosis or atelectasis in the lung bases. Minimal bilateral pleural effusions.  Since the previous study, there is increasing right-sided pyelocaliectasis and ureterectasis.  The wall of the ureter appears thickened and is enhancing.  No ureteral stones are demonstrated. Changes could represent obstruction due to occult stone, stricture, or pyelonephritis. Delayed views demonstrate a focal area of thickening in the mid right ureter at the level of L4 with loss of the urine column.  This could represent ureteral mass or focal peristalsis.  Consider retrograde pyelography if clinically indicated.  There is mild periureteral stranding.  Multiple intrarenal stones bilaterally are better visualized on the previous unenhanced scan. The left ureter and collecting system are decompressed.  Delayed views of the bladder demonstrate normal bladder filling without wall thickening or filling defect.  The liver, spleen, gallbladder, pancreas, left adrenal gland, abdominal aorta, and retroperitoneal lymph nodes are unremarkable. 2.1 cm nodule in the right adrenal gland.  This demonstrates a fat density on the unenhanced study obtained previously, consistent with an adenoma.  The stomach, small bowel, and colon are not abnormally distended.  Infiltration and gas focally in the anterior abdominal wall fat likely represents a site of injection.  No free air or free fluid in the abdomen.  Pelvis:  Uterus appears to be surgically absent.  Adnexal structures are not abnormally enlarged.  Suggestion of involuting cyst  in the right ovary.  No free or loculated pelvic fluid collections.  No evidence of diverticulitis.  The appendix is normal.  Normal alignment of the lumbar vertebrae.  IMPRESSION: Increasing right-sided pyelocaliectasis and ureterectasis without stones visualized.  Possibility of focal obstructing lesion or thickening in the mid right ureter.  Changes may represent occult stone, stricture, or mass.  Consider retrograde ureter gram for further evaluation if clinically indicated.  Original Report Authenticated By: Neale Burly, M.D.    Scheduled Meds:    . baclofen  10 mg Oral TID  . clopidogrel  75 mg Oral Q breakfast  . famotidine  10 mg Oral Daily  . gabapentin  300 mg Oral TID  . glipiZIDE  20 mg Oral Daily  . insulin aspart  0-15 Units Subcutaneous TID WC  . insulin aspart  0-5 Units Subcutaneous QHS  . insulin detemir  30 Units Subcutaneous QHS  . insulin detemir  40 Units Subcutaneous Daily  . levofloxacin  750 mg Oral Daily  . oxybutynin  10 mg Oral QHS  . psyllium  1 packet Oral BID  . Tamsulosin HCl  0.4 mg Oral QPC breakfast  . topiramate  200 mg Oral BID  . traZODone  150 mg Oral QHS  . DISCONTD: cefTRIAXone (ROCEPHIN)  IV  1 g Intravenous Q24H   Continuous Infusions:    . sodium chloride 100 mL/hr at 08/25/11 0237     Time spent: > 30 minutes   Demarkis Gheen  [facility] Pager [phone]. If 8PM-8AM, please contact night-coverage at [email or web address], password [facility] 08/25/2011, 12:01 PM  LOS: 4 days

## 2011-08-25 NOTE — Care Management Note (Unsigned)
    Page 1 of 1   08/25/2011     2:39:46 PM   CARE MANAGEMENT NOTE 08/25/2011  Patient:  [name], [name]   Account Number:  [id number]  Date Initiated:  08/25/2011  Documentation initiated by:  Dessa Phi  Subjective/Objective Assessment:   ADMITTED W/R URTERAL STONE,PYELONEPHRITIS.     Action/Plan:   FROM HOME   Anticipated DC Date:  08/26/2011   Anticipated DC Plan:  [place]  CM consult      Choice offered to / List presented to:             Status of service:  In process, will continue to follow Medicare Important Message given?   (If response is "NO", the following Medicare IM given date fields will be blank) Date Medicare IM given:   Date Additional Medicare IM given:    Discharge Disposition:    Per UR Regulation:  Reviewed for med. necessity/level of care/duration of stay  If discussed at [place] of Stay Meetings, dates discussed:    Comments:  08/25/11 Shontay Wallner RN,BSN NCM [phone] S/P [place].

## 2011-08-25 NOTE — Progress Notes (Signed)
UR completed 

## 2011-08-25 NOTE — Progress Notes (Signed)
Patient ID: [name], female   DOB: 1965-07-28, 46 y.o.   MRN: [id number] 1 Day Post-Op  Subjective: [name] was found to have a right proximal ureteral stricture on retrograde pyelography.  A 4.40f stent was placed as a larger stent wouldn't pass.   She is having pain today but it bladder pain with exacerbation of her flank pain with voiding which is common with a stent.  She reports a history of mixed incontinence and has been on anticholinergics in the past without success. ROS: Negative except for the pain and incontinence as noted above.  Past history reviewed and updated.  Objective: Vital signs in last 24 hours: Temp:  [97.6 F (36.4 C)-98.4 F (36.9 C)] 97.7 F (36.5 C) (08/06 0435) Pulse Rate:  [76-95] 82  (08/06 0435) Resp:  [16-20] 20  (08/06 0435) BP: (99-140)/(67-99) 99/67 mmHg (08/06 0435) SpO2:  [94 %-100 %] 96 % (08/06 0435)  Intake/Output from previous day: 08/05 0701 - 08/06 0700 In: 2310 [P.O.:360; I.V.:1900; IV Piggyback:50] Out: 1050 [Urine:1050] Intake/Output this shift:    General appearance: alert and no distress GI: soft obese with no flank tenderness but with RLQ tenderness.    Lab Results:   Basename 08/25/11 0403 08/23/11 0003  WBC 6.9 8.9  HGB 9.7* 10.1*  HCT 30.7* 31.3*  PLT 288 228   BMET  Basename 08/23/11 0003  NA 139  K 3.5  CL 107  CO2 23  GLUCOSE 179*  BUN 9  CREATININE 0.90  CALCIUM 8.6   PT/INR No results found for this basename: LABPROT:2,INR:2 in the last 72 hours ABG No results found for this basename: PHART:2,PCO2:2,PO2:2,HCO3:2 in the last 72 hours  Studies/Results: Ct Abdomen Pelvis W Contrast  08/23/2011  *RADIOLOGY REPORT*  Clinical Data: Hematuria and right flank pain.  CT ABDOMEN AND PELVIS WITH CONTRAST  Technique:  Multidetector CT imaging of the abdomen and pelvis was performed following the standard protocol during bolus administration of intravenous contrast.  Contrast: 1241mOMNIPAQUE IOHEXOL 300 MG/ML   SOLN  Comparison: 08/22/2011  Findings: Slight fibrosis or atelectasis in the lung bases. Minimal bilateral pleural effusions.  Since the previous study, there is increasing right-sided pyelocaliectasis and ureterectasis.  The wall of the ureter appears thickened and is enhancing.  No ureteral stones are demonstrated. Changes could represent obstruction due to occult stone, stricture, or pyelonephritis. Delayed views demonstrate a focal area of thickening in the mid right ureter at the level of L4 with loss of the urine column.  This could represent ureteral mass or focal peristalsis.  Consider retrograde pyelography if clinically indicated.  There is mild periureteral stranding.  Multiple intrarenal stones bilaterally are better visualized on the previous unenhanced scan. The left ureter and collecting system are decompressed.  Delayed views of the bladder demonstrate normal bladder filling without wall thickening or filling defect.  The liver, spleen, gallbladder, pancreas, left adrenal gland, abdominal aorta, and retroperitoneal lymph nodes are unremarkable. 2.1 cm nodule in the right adrenal gland.  This demonstrates a fat density on the unenhanced study obtained previously, consistent with an adenoma.  The stomach, small bowel, and colon are not abnormally distended.  Infiltration and gas focally in the anterior abdominal wall fat likely represents a site of injection.  No free air or free fluid in the abdomen.  Pelvis:  Uterus appears to be surgically absent.  Adnexal structures are not abnormally enlarged.  Suggestion of involuting cyst in the right ovary.  No free or loculated pelvic fluid collections.  No evidence of diverticulitis.  The appendix is normal.  Normal alignment of the lumbar vertebrae.  IMPRESSION: Increasing right-sided pyelocaliectasis and ureterectasis without stones visualized.  Possibility of focal obstructing lesion or thickening in the mid right ureter.  Changes may represent occult  stone, stricture, or mass.  Consider retrograde ureter gram for further evaluation if clinically indicated.  Original Report Authenticated By: Neale Burly, M.D.    Anti-infectives: Anti-infectives     Start     Dose/Rate Route Frequency Ordered Stop   08/22/11 2300   cefTRIAXone (ROCEPHIN) 1 g in dextrose 5 % 50 mL IVPB        1 g 100 mL/hr over 30 Minutes Intravenous Every 24 hours 08/22/11 0814     08/22/11 0700   cefTRIAXone (ROCEPHIN) 1 g in dextrose 5 % 50 mL IVPB  Status:  Discontinued        1 g 100 mL/hr over 30 Minutes Intravenous Every 24 hours 08/22/11 0649 08/22/11 0814   08/21/11 2345   cefTRIAXone (ROCEPHIN) 1 g in dextrose 5 % 50 mL IVPB        1 g 100 mL/hr over 30 Minutes Intravenous  Once 08/21/11 2331 08/22/11 0033          Current Facility-Administered Medications  Medication Dose Route Frequency Provider Last Rate Last Dose  . 0.9 %  sodium chloride infusion   Intravenous Continuous Reyne Dumas, MD 100 mL/hr at 08/25/11 0237    . acetaminophen (TYLENOL) tablet 650 mg  650 mg Oral Q6H PRN Elwyn Reach, MD   650 mg at 08/23/11 1401   Or  . acetaminophen (TYLENOL) suppository 650 mg  650 mg Rectal Q6H PRN Elwyn Reach, MD      . baclofen (LIORESAL) tablet 10 mg  10 mg Oral TID Elwyn Reach, MD   10 mg at 08/24/11 2328  . cefTRIAXone (ROCEPHIN) 1 g in dextrose 5 % 50 mL IVPB  1 g Intravenous Q24H Elwyn Reach, MD   1 g at 08/25/11 0001  . famotidine (PEPCID) tablet 10 mg  10 mg Oral Daily Elwyn Reach, MD   10 mg at 08/24/11 0924  . gabapentin (NEURONTIN) capsule 300 mg  300 mg Oral TID Elwyn Reach, MD   300 mg at 08/24/11 2329  . glipiZIDE (GLUCOTROL XL) 24 hr tablet 20 mg  20 mg Oral Daily Elwyn Reach, MD   20 mg at 08/24/11 0924  . insulin aspart (novoLOG) injection 0-15 Units  0-15 Units Subcutaneous TID WC Elwyn Reach, MD   2 Units at 08/24/11 0757  . insulin aspart (novoLOG) injection 0-5 Units  0-5 Units  Subcutaneous QHS Elwyn Reach, MD      . insulin detemir (LEVEMIR) injection 30 Units  30 Units Subcutaneous QHS Elwyn Reach, MD   30 Units at 08/24/11 2330  . insulin detemir (LEVEMIR) injection 40 Units  40 Units Subcutaneous Daily Elwyn Reach, MD   40 Units at 08/23/11 0944  . morphine 2 MG/ML injection 2 mg  2 mg Intravenous Q4H PRN Elwyn Reach, MD   2 mg at 08/25/11 0657  . ondansetron (ZOFRAN) tablet 4 mg  4 mg Oral Q6H PRN Elwyn Reach, MD       Or  . ondansetron (ZOFRAN) injection 4 mg  4 mg Intravenous Q6H PRN Elwyn Reach, MD      . oxybutynin (DITROPAN-XL) 24 hr tablet 10 mg  10  mg Oral QHS Malka So, MD      . phenazopyridine (PYRIDIUM) tablet 200 mg  200 mg Oral TID PRN Malka So, MD      . psyllium (HYDROCIL/METAMUCIL) packet 1 packet  1 packet Oral BID Reyne Dumas, MD   1 packet at 08/24/11 2200  . Tamsulosin HCl (FLOMAX) capsule 0.4 mg  0.4 mg Oral QPC breakfast Reyne Dumas, MD   0.4 mg at 08/24/11 0923  . topiramate (TOPAMAX) tablet 200 mg  200 mg Oral BID Elwyn Reach, MD   200 mg at 08/24/11 2329  . traZODone (DESYREL) tablet 150 mg  150 mg Oral QHS Elwyn Reach, MD   150 mg at 08/24/11 2329  . DISCONTD: clopidogrel (PLAVIX) tablet 75 mg  75 mg Oral Daily Elwyn Reach, MD   75 mg at 08/23/11 0944  . DISCONTD: enoxaparin (LOVENOX) injection 40 mg  40 mg Subcutaneous Daily Reyne Dumas, MD   40 mg at 08/23/11 1238  . DISCONTD: fentaNYL (SUBLIMAZE) injection 25-50 mcg  25-50 mcg Intravenous Q5 min PRN Salley Scarlet, MD      . DISCONTD: iohexol (OMNIPAQUE) 300 MG/ML solution    PRN Molli Hazard, MD   10 mL at 08/24/11 1517  . DISCONTD: lidocaine (XYLOCAINE) 2 % jelly    PRN Molli Hazard, MD   1 application at 79/89/21 1506  . DISCONTD: promethazine (PHENERGAN) injection 6.25-12.5 mg  6.25-12.5 mg Intravenous Q15 min PRN Salley Scarlet, MD      . DISCONTD: sterile water for irrigation for irrigation    PRN Molli Hazard, MD   3,000 mL at 08/24/11 1441   Facility-Administered Medications Ordered in Other Encounters  Medication Dose Route Frequency Provider Last Rate Last Dose  . DISCONTD: fentaNYL (SUBLIMAZE) injection    PRN Anne Fu, CRNA   25 mcg at 08/24/11 1507  . DISCONTD: lactated ringers infusion    Continuous PRN Anne Fu, CRNA      . DISCONTD: lidocaine (cardiac) 100 mg/26m (XYLOCAINE) 20 MG/ML injection 2%    PRN SAnne Fu CRNA   100 mg at 08/24/11 1447  . DISCONTD: metoCLOPramide (REGLAN) injection    PRN SAnne Fu CRNA   10 mg at 08/24/11 1505  . DISCONTD: midazolam (VERSED) 5 MG/5ML injection    PRN SAnne Fu CRNA   2 mg at 08/24/11 1440  . DISCONTD: ondansetron (ZOFRAN) injection    PRN SAnne Fu CRNA   4 mg at 08/24/11 1519  . DISCONTD: propofol (DIPRIVAN) 10 mg/mL bolus/IV push    PRN SAnne Fu CRNA   180 mg at 08/24/11 1448    Assessment: s/p Procedure(s): CYSTOSCOPY/RETROGRADE/URETEROSCOPY  Right ureteral stricture with obstruction and pyelonephritis. Post stent placement pain.  Plan: I am going to put her on oxybutynin ER 148mdaily and pyridium 20036mo tid prn for the bladder symptoms. She is going to need ureteroscopy for further evaluation and treatment of her right ureteral stricture in 2-3 weeks.  The current stent will help dilate the ureter for that procedure. Post discharge, she should probably be kept on a suppressive antibiotic with either nitrofurantoin 100m71mght or trimethoprim 100mg86mht after she completes her therapeutic course of medication to prevent recurrent infection prior to the repeat procedure. I think pain control will continue to be an issue even with the stent because of her chronic pain issues.   LOS: 4 days  Rivaan Kendall J 08/25/2011

## 2011-08-26 LAB — CBC
HCT: 32 % — ABNORMAL LOW (ref 36.0–46.0)
Hemoglobin: 10.3 g/dL — ABNORMAL LOW (ref 12.0–15.0)
MCH: 28.8 pg (ref 26.0–34.0)
MCHC: 32.2 g/dL (ref 30.0–36.0)
MCV: 89.4 fL (ref 78.0–100.0)
Platelets: 318 10*3/uL (ref 150–400)
RBC: 3.58 MIL/uL — ABNORMAL LOW (ref 3.87–5.11)
RDW: 14.2 % (ref 11.5–15.5)
WBC: 7.5 10*3/uL (ref 4.0–10.5)

## 2011-08-26 LAB — BASIC METABOLIC PANEL
BUN: 10 mg/dL (ref 6–23)
CO2: 19 mEq/L (ref 19–32)
Calcium: 8.8 mg/dL (ref 8.4–10.5)
Chloride: 107 mEq/L (ref 96–112)
Creatinine, Ser: 0.82 mg/dL (ref 0.50–1.10)
GFR calc Af Amer: >90 mL/min (ref >90)
GFR calc non Af Amer: 85 mL/min — ABNORMAL LOW (ref >90)
Glucose, Bld: 136 mg/dL — ABNORMAL HIGH (ref 70–99)
Potassium: 3.5 mEq/L (ref 3.5–5.1)
Sodium: 136 mEq/L (ref 135–145)

## 2011-08-26 LAB — GLUCOSE, CAPILLARY
Glucose-Capillary: 132 mg/dL — ABNORMAL HIGH (ref 70–99)
Glucose-Capillary: 176 mg/dL — ABNORMAL HIGH (ref 70–99)
Glucose-Capillary: 75 mg/dL (ref 70–99)

## 2011-08-26 MED ORDER — MORPHINE SULFATE 15 MG PO TABS: 15.0000 mg | ORAL_TABLET | Freq: Four times a day (QID) | ORAL | Status: AC | PRN

## 2011-08-26 MED ORDER — ONDANSETRON HCL 8 MG PO TABS: 8.0000 mg | ORAL_TABLET | Freq: Three times a day (TID) | ORAL | Status: DC | PRN

## 2011-08-26 MED ORDER — TAMSULOSIN HCL 0.4 MG PO CAPS: 0.4000 mg | ORAL_CAPSULE | Freq: Every day | ORAL | Status: DC

## 2011-08-26 MED ORDER — LEVOFLOXACIN 750 MG PO TABS: 750.0000 mg | ORAL_TABLET | Freq: Every day | ORAL | Status: AC

## 2011-08-26 MED ORDER — PHENAZOPYRIDINE HCL 200 MG PO TABS: 200.0000 mg | ORAL_TABLET | Freq: Three times a day (TID) | ORAL | Status: AC | PRN

## 2011-08-26 MED ORDER — OXYBUTYNIN CHLORIDE ER 10 MG PO TB24: 10.0000 mg | ORAL_TABLET | Freq: Every day | ORAL | Status: DC

## 2011-08-26 MED ORDER — NITROFURANTOIN MONOHYD MACRO 100 MG PO CAPS: 100.0000 mg | ORAL_CAPSULE | Freq: Every day | ORAL | Status: AC

## 2011-08-26 NOTE — Progress Notes (Signed)
Patient d/c to home with new medications. Refused nurse's offer of printed med info. Will receive med info. From her pharmacy with filling of Rxs.

## 2011-08-26 NOTE — Progress Notes (Signed)
[facility] PROGRESS NOTE  [name] WRU:[id number] DOB: 1965/11/21 DOA: 08/21/2011 PCP: Philis Fendt, MD  Assessment/Plan: 1. Pyelonephritis: continues to improve. Afebrile, leukocytosis resolved. Continue oxybutynin ER 42m daily and pyridium 2067mpo tid prn for the bladder symptoms per urology. Urology recommends suppressive antibiotic with either nitrofurantoin 10015might after she completes her therapeutic course of medication to prevent recurrent infection prior to the repeat procedure as an outpatient. 2. Right hydronephrosis with right ureter stricture: status-post stent placement. She is going to need ureteroscopy for further evaluation and treatment of her right ureteral stricture in 2-3 weeks. The current stent will help dilate the ureter for that procedure.  3. Hematuria: thought to be secondary to UTI, Plavix. 4. Diabetes Mellitus with associated neuropathy: Stable. Continue glipizide, Levemir, SSI. 5. History of epilepsy  Code Status: full Family Communication: none at beside--but notified daughter through ED RN by patient request Disposition Plan: home when improved  DanMurray HodgkinsD  [facility] Team 4 Pager [phone] 8PM-8AM, please contact night-coverage at [email or web address], password [email or web address]7/2013, 4:00 PM  LOS: 5 days   Brief narrative: [age]ar old woman presented with right flank pain. Admitted for pyelonephritis.  Consultants:  Urology  Procedures: 8/7 Right ureter stent placement  Antibiotics:  Rocephin 8/3>>8/6  Levaquin 8/6>>8/12  HPI/Subjective: Feels better. Some nausea, but eating. Pain overall better.  Objective: Filed Vitals:   08/25/11 1317 08/25/11 2145 08/26/11 0700 08/26/11 1329  BP: 118/72 122/83 112/79 126/87  Pulse: 79 72 96 73  Temp: 97.8 F (36.6 C) 98.2 F (36.8 C) 97.3 F (36.3 C) 98.1 F (36.7 C)  TempSrc: Oral Oral Oral Oral  Resp: 18 20 20 20   Height:      Weight:      SpO2: 98% 99% 100% 100%     Intake/Output Summary (Last 24 hours) at 08/26/11 1600 Last data filed at 08/26/11 1423  Gross per 24 hour  Intake   2175 ml  Output   3475 ml  Net  -1300 ml   Wt Readings from Last 3 Encounters:  08/22/11 108.41 kg (239 lb)  08/22/11 108.41 kg (239 lb)  06/12/11 108.954 kg (240 lb 3.2 oz)    Exam:   General:  Appears calm and comfortable. Speech fluent and clear.  Cardiovascular: RRR, no m/r/g. No LE edema.  Respiratory: CTA bilaterally, no w/r/r. Normal respiratory effort.  Abdomen: soft  Data Reviewed: Basic Metabolic Panel:  Lab 08/56/21/3030 08/23/11 0003 08/21/11 1802  NA 136 139 137  K 3.5 3.5 3.3*  CL 107 107 104  CO2 19 23 20   GLUCOSE 136* 179* 262*  BUN 10 9 8   CREATININE 0.82 0.90 0.79  CALCIUM 8.8 8.6 9.1  MG -- -- --  PHOS -- -- --   Liver Function Tests:  Lab 08/23/11 0003 08/21/11 2358  AST 9 10  ALT 8 10  ALKPHOS 89 104  BILITOT 0.2* 0.4  PROT 6.8 8.0  ALBUMIN 2.5* 3.3*    Lab 08/21/11 2358  LIPASE 16  AMYLASE --   CBC:  Lab 08/26/11 0430 08/25/11 0403 08/23/11 0003 08/21/11 1802  WBC 7.5 6.9 8.9 17.9*  NEUTROABS -- -- -- 14.3*  HGB 10.3* 9.7* 10.1* 12.0  HCT 32.0* 30.7* 31.3* 36.4  MCV 89.4 89.8 90.7 88.8  PLT 318 288 228 258   CBG:  Lab 08/26/11 1143 08/26/11 0748 08/25/11 2017 08/25/11 1635 08/25/11 1137  GLUCAP 176* 132* 115* 80 180*    Recent Results (from the past 240  hour(s))  URINE CULTURE     Status: Normal   Collection Time   08/21/11  7:09 PM      Component Value Range Status Comment   Specimen Description URINE, CLEAN CATCH   Final    Special Requests TO:IZTIW ON [id number] @2359    Final    Culture  Setup Time 08/22/2011 00:47   Final    Colony Count >=100,000 COLONIES/ML   Final    Culture ESCHERICHIA COLI   Final    Report Status 08/24/2011 FINAL   Final    Organism ID, Bacteria ESCHERICHIA COLI   Final   CULTURE, BLOOD (ROUTINE X 2)     Status: Normal (Preliminary result)   Collection Time   08/21/11  11:50 PM      Component Value Range Status Comment   Specimen Description BLOOD RIGHT ARM   Final    Special Requests BOTTLES DRAWN AEROBIC AND ANAEROBIC 10CC EACH   Final    Culture  Setup Time 08/22/2011 09:07   Final    Culture     Final    Value:        BLOOD CULTURE RECEIVED NO GROWTH TO DATE CULTURE WILL BE HELD FOR 5 DAYS BEFORE ISSUING A FINAL NEGATIVE REPORT   Report Status PENDING   Incomplete   CULTURE, BLOOD (ROUTINE X 2)     Status: Normal (Preliminary result)   Collection Time   08/21/11 11:55 PM      Component Value Range Status Comment   Specimen Description BLOOD RIGHT HAND   Final    Special Requests BOTTLES DRAWN AEROBIC AND ANAEROBIC 5CC EACH   Final    Culture  Setup Time 08/22/2011 09:07   Final    Culture     Final    Value:        BLOOD CULTURE RECEIVED NO GROWTH TO DATE CULTURE WILL BE HELD FOR 5 DAYS BEFORE ISSUING A FINAL NEGATIVE REPORT   Report Status PENDING   Incomplete   URINE CULTURE     Status: Normal   Collection Time   08/22/11  9:47 AM      Component Value Range Status Comment   Specimen Description URINE, CLEAN CATCH   Final    Special Requests NONE   Final    Culture  Setup Time 08/22/2011 14:22   Final    Colony Count 25,000 COLONIES/ML   Final    Culture     Final    Value: Multiple bacterial morphotypes present, none predominant. Suggest appropriate recollection if clinically indicated.   Report Status 08/23/2011 FINAL   Final   SURGICAL PCR SCREEN     Status: Normal   Collection Time   08/24/11 11:24 AM      Component Value Range Status Comment   MRSA, PCR NEGATIVE  NEGATIVE Final    Staphylococcus aureus NEGATIVE  NEGATIVE Final     Studies:  Scheduled Meds:   . baclofen  10 mg Oral TID  . clopidogrel  75 mg Oral Q breakfast  . famotidine  10 mg Oral Daily  . gabapentin  300 mg Oral TID  . glipiZIDE  20 mg Oral Daily  . insulin aspart  0-15 Units Subcutaneous TID WC  . insulin aspart  0-5 Units Subcutaneous QHS  . insulin detemir   30 Units Subcutaneous QHS  . insulin detemir  40 Units Subcutaneous Daily  . levofloxacin  750 mg Oral Daily  . oxybutynin  10 mg Oral QHS  .  psyllium  1 packet Oral BID  . Tamsulosin HCl  0.4 mg Oral QPC breakfast  . topiramate  200 mg Oral BID  . traZODone  150 mg Oral QHS   Continuous Infusions:   . sodium chloride 50 mL/hr at 08/25/11 2300    Principal Problem:  *Pyelonephritis Active Problems:  DIABETES MELLITUS, TYPE II  HYPERTENSION  MYOCARDIAL INFARCTION, HX OF  Nephrolithiasis  Mixed urge and stress incontinence  Ureteral stricture, right     Murray Hodgkins, MD  [facility] Team 4 Pager [phone]. If 8PM-8AM, please contact night-coverage at [email or web address], password [facility] 08/26/2011, 4:00 PM  LOS: 5 days

## 2011-08-26 NOTE — Progress Notes (Signed)
Patient ID: [name], female   DOB: Jun 04, 1965, 46 y.o.   MRN: [id number] 2 Days Post-Op  Subjective: She is feeling better with reduced flank pain.   She still has some bladder spasms. ROS: Negative except as above  Objective: Vital signs in last 24 hours: Temp:  [97.3 F (36.3 C)-98.2 F (36.8 C)] 97.3 F (36.3 C) (08/07 0700) Pulse Rate:  [72-96] 96  (08/07 0700) Resp:  [18-20] 20  (08/07 0700) BP: (112-124)/(72-86) 112/79 mmHg (08/07 0700) SpO2:  [97 %-100 %] 100 % (08/07 0700)  Intake/Output from previous day: 08/06 0701 - 08/07 0700 In: 2175 [P.O.:720; I.V.:1455] Out: 4100 [Urine:4100] Intake/Output this shift: Total I/O In: -  Out: 450 [Urine:450]    Lab Results:   Basename 08/26/11 0430 08/25/11 0403  WBC 7.5 6.9  HGB 10.3* 9.7*  HCT 32.0* 30.7*  PLT 318 288   BMET  Basename 08/26/11 0430  NA 136  K 3.5  CL 107  CO2 19  GLUCOSE 136*  BUN 10  CREATININE 0.82  CALCIUM 8.8   PT/INR No results found for this basename: LABPROT:2,INR:2 in the last 72 hours ABG No results found for this basename: PHART:2,PCO2:2,PO2:2,HCO3:2 in the last 72 hours  Studies/Results: No results found.  Anti-infectives: Anti-infectives     Start     Dose/Rate Route Frequency Ordered Stop   08/25/11 1300   levofloxacin (LEVAQUIN) tablet 750 mg        750 mg Oral Daily 08/25/11 1159     08/22/11 2300   cefTRIAXone (ROCEPHIN) 1 g in dextrose 5 % 50 mL IVPB  Status:  Discontinued        1 g 100 mL/hr over 30 Minutes Intravenous Every 24 hours 08/22/11 0814 08/25/11 1159   08/22/11 0700   cefTRIAXone (ROCEPHIN) 1 g in dextrose 5 % 50 mL IVPB  Status:  Discontinued        1 g 100 mL/hr over 30 Minutes Intravenous Every 24 hours 08/22/11 0649 08/22/11 0814   08/21/11 2345   cefTRIAXone (ROCEPHIN) 1 g in dextrose 5 % 50 mL IVPB        1 g 100 mL/hr over 30 Minutes Intravenous  Once 08/21/11 2331 08/22/11 0033          Current Facility-Administered Medications    Medication Dose Route Frequency Provider Last Rate Last Dose  . 0.9 %  sodium chloride infusion   Intravenous Continuous Barton Dubois, MD 50 mL/hr at 08/25/11 2300    . acetaminophen (TYLENOL) tablet 650 mg  650 mg Oral Q6H PRN Elwyn Reach, MD   650 mg at 08/23/11 1401   Or  . acetaminophen (TYLENOL) suppository 650 mg  650 mg Rectal Q6H PRN Elwyn Reach, MD      . baclofen (LIORESAL) tablet 10 mg  10 mg Oral TID Elwyn Reach, MD   10 mg at 08/25/11 2130  . clopidogrel (PLAVIX) tablet 75 mg  75 mg Oral Q breakfast Barton Dubois, MD   75 mg at 08/26/11 0751  . famotidine (PEPCID) tablet 10 mg  10 mg Oral Daily Elwyn Reach, MD   10 mg at 08/25/11 1007  . gabapentin (NEURONTIN) capsule 300 mg  300 mg Oral TID Elwyn Reach, MD   300 mg at 08/25/11 2130  . glipiZIDE (GLUCOTROL XL) 24 hr tablet 20 mg  20 mg Oral Daily Elwyn Reach, MD   20 mg at 08/25/11 1000  . insulin aspart (novoLOG) injection  0-15 Units  0-15 Units Subcutaneous TID WC Elwyn Reach, MD   2 Units at 08/26/11 0753  . insulin aspart (novoLOG) injection 0-5 Units  0-5 Units Subcutaneous QHS Elwyn Reach, MD      . insulin detemir (LEVEMIR) injection 30 Units  30 Units Subcutaneous QHS Elwyn Reach, MD   30 Units at 08/25/11 2132  . insulin detemir (LEVEMIR) injection 40 Units  40 Units Subcutaneous Daily Elwyn Reach, MD   40 Units at 08/25/11 1009  . levofloxacin (LEVAQUIN) tablet 750 mg  750 mg Oral Daily Barton Dubois, MD   750 mg at 08/25/11 1307  . morphine (MSIR) tablet 15 mg  15 mg Oral Q6H PRN Barton Dubois, MD   15 mg at 08/25/11 2130  . ondansetron (ZOFRAN) tablet 4 mg  4 mg Oral Q6H PRN Elwyn Reach, MD   4 mg at 08/25/11 1205   Or  . ondansetron (ZOFRAN) injection 4 mg  4 mg Intravenous Q6H PRN Elwyn Reach, MD      . oxybutynin (DITROPAN-XL) 24 hr tablet 10 mg  10 mg Oral QHS Malka So, MD   10 mg at 08/25/11 2130  . phenazopyridine (PYRIDIUM) tablet 200 mg  200 mg  Oral TID PRN Malka So, MD   200 mg at 08/25/11 1010  . psyllium (HYDROCIL/METAMUCIL) packet 1 packet  1 packet Oral BID Reyne Dumas, MD   1 packet at 08/25/11 2130  . Tamsulosin HCl (FLOMAX) capsule 0.4 mg  0.4 mg Oral QPC breakfast Reyne Dumas, MD   0.4 mg at 08/25/11 1007  . topiramate (TOPAMAX) tablet 200 mg  200 mg Oral BID Elwyn Reach, MD   200 mg at 08/25/11 2130  . traZODone (DESYREL) tablet 150 mg  150 mg Oral QHS Elwyn Reach, MD   150 mg at 08/25/11 2130  . DISCONTD: cefTRIAXone (ROCEPHIN) 1 g in dextrose 5 % 50 mL IVPB  1 g Intravenous Q24H Elwyn Reach, MD   1 g at 08/25/11 0001  . DISCONTD: morphine 2 MG/ML injection 2 mg  2 mg Intravenous Q4H PRN Elwyn Reach, MD   2 mg at 08/25/11 1156    Assessment: s/p Procedure(s): CYSTOSCOPY/RETROGRADE/URETEROSCOPY  Her pain is improving and she remains afebrile.  Plan:  My office is working on scheduling her f/u procedure.   LOS: 5 days    Latonda Larrivee J 08/26/2011

## 2011-08-26 NOTE — Discharge Summary (Signed)
Physician Discharge Summary  [name] OBS:[id number] DOB: [birth year] DOA: 08/21/2011  PCP: Philis Fendt, MD Urologist: Lenna Sciara. Jeffie Pollock, MD  Admit date: 08/21/2011 Discharge date: 08/26/2011  Recommendations for Outpatient Follow-up:  1. Follow-up resolution of pyelonephritis 2. Follow-up right ureteral stricture   Follow-up Information    Follow up with AVBUERE,EDWIN A, MD in 1 week.   Contact information:   [address] [place] [phone]       Follow up with Malka So, MD. Schedule an appointment as soon as possible for a visit in 2 weeks.   Contact information:   [address] [place] [phone]         Discharge Diagnoses:  1. Pyelonephritis 2. Right hydronephrosis with right ureter stricture 3. Hematuria 4. Diabetes mellitus with associated neuropathy  Discharge Condition: improved Disposition: home  Diet recommendation: diabetic diet  History of present illness:  46 year old woman presented with right flank pain. Admitted for pyelonephritis.  Hospital Course:  [name] was admitted for treatment of pyelonephritis. Subsequent imaging revealed right hydronephrosis with right ureter stricture and patient was seen by urology and underwent stent placement. Clinically, pyelonephritis has resolved and she is stable for discharge home. She will follow-up with urology as an outpatient. 1. Pyelonephritis: continues to improve. Afebrile, leukocytosis resolved. Continue oxybutynin ER 57m daily and pyridium 2049mpo tid prn for the bladder symptoms per urology. Urology recommends suppressive antibiotic with nitrofurantoin 10057might after she completes her therapeutic course of medication to prevent recurrent infection prior to the repeat procedure as an outpatient.  2. Right hydronephrosis with right ureter stricture: status-post stent placement. She is going to need ureteroscopy for further  evaluation and treatment of her right ureteral stricture in 2-3 weeks. The current stent will help dilate the ureter for that procedure.    3. Hematuria: thought to be secondary to UTI, Plavix.  4. Diabetes Mellitus with associated neuropathy: Stable. Continue glipizide, Levemir, SSI.  5. History of epilepsy  Consultants:  Urology  Procedures: 8/7 Right ureter stent placement  Antibiotics:  Rocephin 8/3>>8/6   Levaquin 8/6>>8/12  Discharge Instructions  Discharge Orders    Future Orders Please Complete By Expires   Diet Carb Modified      Increase activity slowly      Discharge instructions      Comments:   Follow-up with urology as directed. Be sure to finish Levaquin antibiotic. After Levaquin antibiotic is complete, then start nitrofurantoin each evening to prevent recurrent infection.     Medication List  As of 08/26/2011  5:30 PM   TAKE these medications         albuterol 108 (90 BASE) MCG/ACT inhaler   Commonly known as: PROVENTIL HFA;VENTOLIN HFA   Inhale 2 puffs into the lungs every 6 (six) hours as needed. Wheezing        baclofen 10 MG tablet   Commonly known as: LIORESAL   Take 10 mg by mouth 3 (three) times daily.      budesonide-formoterol 160-4.5 MCG/ACT inhaler   Commonly known as: SYMBICORT   Inhale 2 puffs into the lungs 2 (two) times daily.      clopidogrel 75 MG tablet   Commonly known as: PLAVIX   Take 75 mg by mouth daily.      gabapentin 300 MG capsule   Commonly known as: NEURONTIN   Take 300 mg by mouth 3 (three) times daily.      glipiZIDE 10 MG  24 hr tablet   Commonly known as: GLUCOTROL XL   Take 20 mg by mouth daily.      insulin aspart 100 UNIT/ML injection   Commonly known as: novoLOG   Inject 2-14 Units into the skin 4 (four) times daily. Sliding scale      insulin detemir 100 UNIT/ML injection   Commonly known as: LEVEMIR   Inject 30-40 Units into the skin 2 (two) times daily. 30 am 40 pm      levofloxacin 750 MG tablet     Commonly known as: LEVAQUIN   Take 1 tablet (750 mg total) by mouth daily. Start 8/8.      morphine 15 MG tablet   Commonly known as: MSIR   Take 1 tablet (15 mg total) by mouth every 6 (six) hours as needed for pain.      nitrofurantoin (macrocrystal-monohydrate) 100 MG capsule   Commonly known as: MACROBID   Take 1 capsule (100 mg total) by mouth at bedtime. Do not start until Levaquin antibiotic has been completed. Nitrofurantoin is to suppressive recurrent infection.      ondansetron 8 MG tablet   Commonly known as: ZOFRAN   Take 1 tablet (8 mg total) by mouth every 8 (eight) hours as needed for nausea. For nausea and vomiting      oxybutynin 10 MG 24 hr tablet   Commonly known as: DITROPAN-XL   Take 1 tablet (10 mg total) by mouth at bedtime.      phenazopyridine 200 MG tablet   Commonly known as: PYRIDIUM   Take 1 tablet (200 mg total) by mouth 3 (three) times daily as needed (bladder pain and burning).      promethazine 25 MG suppository   Commonly known as: PHENERGAN   Place 1 suppository (25 mg total) rectally every 6 (six) hours as needed for nausea.      ranitidine 150 MG tablet   Commonly known as: ZANTAC   Take 150 mg by mouth 2 (two) times daily.      Tamsulosin HCl 0.4 MG Caps   Commonly known as: FLOMAX   Take 1 capsule (0.4 mg total) by mouth daily after breakfast.      topiramate 200 MG tablet   Commonly known as: TOPAMAX   Take 200 mg by mouth 2 (two) times daily.      traZODone 150 MG tablet   Commonly known as: DESYREL   Take 150 mg by mouth at bedtime.            The results of significant diagnostics from this hospitalization (including imaging, microbiology, ancillary and laboratory) are listed below for reference.    Significant Diagnostic Studies: Ct Abdomen Pelvis W Contrast  08/23/2011  *RADIOLOGY REPORT*  Clinical Data: Hematuria and right flank pain.  CT ABDOMEN AND PELVIS WITH CONTRAST  Technique:  Multidetector CT imaging of the  abdomen and pelvis was performed following the standard protocol during bolus administration of intravenous contrast.  Contrast: 178m OMNIPAQUE IOHEXOL 300 MG/ML  SOLN  Comparison: 08/22/2011  Findings: Slight fibrosis or atelectasis in the lung bases. Minimal bilateral pleural effusions.  Since the previous study, there is increasing right-sided pyelocaliectasis and ureterectasis.  The wall of the ureter appears thickened and is enhancing.  No ureteral stones are demonstrated. Changes could represent obstruction due to occult stone, stricture, or pyelonephritis. Delayed views demonstrate a focal area of thickening in the mid right ureter at the level of L4 with loss of the urine column.  This could represent ureteral mass or focal peristalsis.  Consider retrograde pyelography if clinically indicated.  There is mild periureteral stranding.  Multiple intrarenal stones bilaterally are better visualized on the previous unenhanced scan. The left ureter and collecting system are decompressed.  Delayed views of the bladder demonstrate normal bladder filling without wall thickening or filling defect.  The liver, spleen, gallbladder, pancreas, left adrenal gland, abdominal aorta, and retroperitoneal lymph nodes are unremarkable. 2.1 cm nodule in the right adrenal gland.  This demonstrates a fat density on the unenhanced study obtained previously, consistent with an adenoma.  The stomach, small bowel, and colon are not abnormally distended.  Infiltration and gas focally in the anterior abdominal wall fat likely represents a site of injection.  No free air or free fluid in the abdomen.  Pelvis:  Uterus appears to be surgically absent.  Adnexal structures are not abnormally enlarged.  Suggestion of involuting cyst in the right ovary.  No free or loculated pelvic fluid collections.  No evidence of diverticulitis.  The appendix is normal.  Normal alignment of the lumbar vertebrae.  IMPRESSION: Increasing right-sided  pyelocaliectasis and ureterectasis without stones visualized.  Possibility of focal obstructing lesion or thickening in the mid right ureter.  Changes may represent occult stone, stricture, or mass.  Consider retrograde ureter gram for further evaluation if clinically indicated.  Original Report Authenticated By: Neale Burly, M.D.   Microbiology: Recent Results (from the past 240 hour(s))  URINE CULTURE     Status: Normal   Collection Time   08/21/11  7:09 PM      Component Value Range Status Comment   Specimen Description URINE, CLEAN CATCH   Final    Special Requests EU:MPNTI ON 144315 @2359    Final    Culture  Setup Time 08/22/2011 00:47   Final    Colony Count >=100,000 COLONIES/ML   Final    Culture ESCHERICHIA COLI   Final    Report Status 08/24/2011 FINAL   Final    Organism ID, Bacteria ESCHERICHIA COLI   Final   CULTURE, BLOOD (ROUTINE X 2)     Status: Normal (Preliminary result)   Collection Time   08/21/11 11:50 PM      Component Value Range Status Comment   Specimen Description BLOOD RIGHT ARM   Final    Special Requests BOTTLES DRAWN AEROBIC AND ANAEROBIC 10CC EACH   Final    Culture  Setup Time 08/22/2011 09:07   Final    Culture     Final    Value:        BLOOD CULTURE RECEIVED NO GROWTH TO DATE CULTURE WILL BE HELD FOR 5 DAYS BEFORE ISSUING A FINAL NEGATIVE REPORT   Report Status PENDING   Incomplete   CULTURE, BLOOD (ROUTINE X 2)     Status: Normal (Preliminary result)   Collection Time   08/21/11 11:55 PM      Component Value Range Status Comment   Specimen Description BLOOD RIGHT HAND   Final    Special Requests BOTTLES DRAWN AEROBIC AND ANAEROBIC 5CC EACH   Final    Culture  Setup Time 08/22/2011 09:07   Final    Culture     Final    Value:        BLOOD CULTURE RECEIVED NO GROWTH TO DATE CULTURE WILL BE HELD FOR 5 DAYS BEFORE ISSUING A FINAL NEGATIVE REPORT   Report Status PENDING   Incomplete   URINE CULTURE     Status:  Normal   Collection Time   08/22/11   9:47 AM      Component Value Range Status Comment   Specimen Description URINE, CLEAN CATCH   Final    Special Requests NONE   Final    Culture  Setup Time 08/22/2011 14:22   Final    Colony Count 25,000 COLONIES/ML   Final    Culture     Final    Value: Multiple bacterial morphotypes present, none predominant. Suggest appropriate recollection if clinically indicated.   Report Status 08/23/2011 FINAL   Final   SURGICAL PCR SCREEN     Status: Normal   Collection Time   08/24/11 11:24 AM      Component Value Range Status Comment   MRSA, PCR NEGATIVE  NEGATIVE Final    Staphylococcus aureus NEGATIVE  NEGATIVE Final     Labs: Basic Metabolic Panel:  Lab 61/84/85 0430 08/23/11 0003 08/21/11 1802  NA 136 139 137  K 3.5 3.5 3.3*  CL 107 107 104  CO2 19 23 20   GLUCOSE 136* 179* 262*  BUN 10 9 8   CREATININE 0.82 0.90 0.79  CALCIUM 8.8 8.6 9.1  MG -- -- --  PHOS -- -- --   Liver Function Tests:  Lab 08/23/11 0003 08/21/11 2358  AST 9 10  ALT 8 10  ALKPHOS 89 104  BILITOT 0.2* 0.4  PROT 6.8 8.0  ALBUMIN 2.5* 3.3*    Lab 08/21/11 2358  LIPASE 16  AMYLASE --   CBC:  Lab 08/26/11 0430 08/25/11 0403 08/23/11 0003 08/21/11 1802  WBC 7.5 6.9 8.9 17.9*  NEUTROABS -- -- -- 14.3*  HGB 10.3* 9.7* 10.1* 12.0  HCT 32.0* 30.7* 31.3* 36.4  MCV 89.4 89.8 90.7 88.8  PLT 318 288 228 258   CBG:  Lab 08/26/11 1143 08/26/11 0748 08/25/11 2017 08/25/11 1635 08/25/11 1137  GLUCAP 176* 132* 115* 80 180*    Principal Problem:  *Pyelonephritis Active Problems:  DIABETES MELLITUS, TYPE II  HYPERTENSION  MYOCARDIAL INFARCTION, HX OF  Nephrolithiasis  Mixed urge and stress incontinence  Ureteral stricture, right   Time coordinating discharge: 35 minutes  Signed:  Murray Hodgkins, MD [facility] 08/26/2011, 5:30 PM

## 2011-08-26 NOTE — Progress Notes (Addendum)
PIV not functioning, attempting to start new IV to continue IVF, Dr. Sarajane Jews stated to hold off until he sees patient, may be discharging patient today.

## 2011-08-28 LAB — CULTURE, BLOOD (ROUTINE X 2)
Culture: NO GROWTH
Culture: NO GROWTH

## 2011-09-01 ENCOUNTER — Other Ambulatory Visit: Payer: Self-pay | Admitting: Urology

## 2011-09-18 ENCOUNTER — Inpatient Hospital Stay (HOSPITAL_COMMUNITY): Admission: RE | Admit: 2011-09-18 | Payer: PRIVATE HEALTH INSURANCE | Source: Ambulatory Visit

## 2011-09-22 ENCOUNTER — Encounter (HOSPITAL_COMMUNITY): Payer: Self-pay | Admitting: Family Medicine

## 2011-09-22 ENCOUNTER — Emergency Department (HOSPITAL_COMMUNITY)
Admission: EM | Admit: 2011-09-22 | Discharge: 2011-09-23 | Disposition: A | Discharge status: Home / Self Care | Payer: PRIVATE HEALTH INSURANCE | Attending: Emergency Medicine | Admitting: Emergency Medicine

## 2011-09-22 DIAGNOSIS — K219 Gastro-esophageal reflux disease without esophagitis: Secondary | ICD-10-CM | POA: Insufficient documentation

## 2011-09-22 DIAGNOSIS — M549 Dorsalgia, unspecified: Secondary | ICD-10-CM | POA: Insufficient documentation

## 2011-09-22 DIAGNOSIS — I10 Essential (primary) hypertension: Secondary | ICD-10-CM | POA: Insufficient documentation

## 2011-09-22 DIAGNOSIS — H548 Legal blindness, as defined in USA: Secondary | ICD-10-CM | POA: Insufficient documentation

## 2011-09-22 DIAGNOSIS — R3989 Other symptoms and signs involving the genitourinary system: Secondary | ICD-10-CM | POA: Insufficient documentation

## 2011-09-22 DIAGNOSIS — H409 Unspecified glaucoma: Secondary | ICD-10-CM | POA: Insufficient documentation

## 2011-09-22 DIAGNOSIS — J449 Chronic obstructive pulmonary disease, unspecified: Secondary | ICD-10-CM | POA: Insufficient documentation

## 2011-09-22 DIAGNOSIS — Z794 Long term (current) use of insulin: Secondary | ICD-10-CM | POA: Insufficient documentation

## 2011-09-22 DIAGNOSIS — J4489 Other specified chronic obstructive pulmonary disease: Secondary | ICD-10-CM | POA: Insufficient documentation

## 2011-09-22 DIAGNOSIS — R011 Cardiac murmur, unspecified: Secondary | ICD-10-CM | POA: Insufficient documentation

## 2011-09-22 DIAGNOSIS — E119 Type 2 diabetes mellitus without complications: Secondary | ICD-10-CM | POA: Insufficient documentation

## 2011-09-22 DIAGNOSIS — I252 Old myocardial infarction: Secondary | ICD-10-CM | POA: Insufficient documentation

## 2011-09-22 DIAGNOSIS — G40309 Generalized idiopathic epilepsy and epileptic syndromes, not intractable, without status epilepticus: Secondary | ICD-10-CM | POA: Insufficient documentation

## 2011-09-22 DIAGNOSIS — N390 Urinary tract infection, site not specified: Secondary | ICD-10-CM

## 2011-09-22 DIAGNOSIS — R319 Hematuria, unspecified: Secondary | ICD-10-CM | POA: Insufficient documentation

## 2011-09-22 LAB — CBC WITH DIFFERENTIAL/PLATELET
Basophils Absolute: 0 10*3/uL (ref 0.0–0.1)
Basophils Relative: 0 % (ref 0–1)
Eosinophils Absolute: 0 10*3/uL (ref 0.0–0.7)
Eosinophils Relative: 0 % (ref 0–5)
HCT: 37.3 % (ref 36.0–46.0)
Hemoglobin: 12.1 g/dL (ref 12.0–15.0)
Lymphocytes Relative: 27 % (ref 12–46)
Lymphs Abs: 2 10*3/uL (ref 0.7–4.0)
MCH: 29.2 pg (ref 26.0–34.0)
MCHC: 32.4 g/dL (ref 30.0–36.0)
MCV: 90.1 fL (ref 78.0–100.0)
Monocytes Absolute: 0.7 10*3/uL (ref 0.1–1.0)
Monocytes Relative: 9 % (ref 3–12)
Neutro Abs: 4.8 10*3/uL (ref 1.7–7.7)
Neutrophils Relative %: 64 % (ref 43–77)
Platelets: 283 10*3/uL (ref 150–400)
RBC: 4.14 MIL/uL (ref 3.87–5.11)
RDW: 14.3 % (ref 11.5–15.5)
WBC: 7.5 10*3/uL (ref 4.0–10.5)

## 2011-09-22 LAB — COMPREHENSIVE METABOLIC PANEL
ALT: 10 U/L (ref 0–35)
AST: 9 U/L (ref 0–37)
Albumin: 3.5 g/dL (ref 3.5–5.2)
Alkaline Phosphatase: 81 U/L (ref 39–117)
BUN: 16 mg/dL (ref 6–23)
CO2: 23 mEq/L (ref 19–32)
Calcium: 9.7 mg/dL (ref 8.4–10.5)
Chloride: 105 mEq/L (ref 96–112)
Creatinine, Ser: 0.9 mg/dL (ref 0.50–1.10)
GFR calc Af Amer: 87 mL/min — ABNORMAL LOW (ref >90)
GFR calc non Af Amer: 75 mL/min — ABNORMAL LOW (ref >90)
Glucose, Bld: 162 mg/dL — ABNORMAL HIGH (ref 70–99)
Potassium: 3.6 mEq/L (ref 3.5–5.1)
Sodium: 139 mEq/L (ref 135–145)
Total Bilirubin: 0.2 mg/dL — ABNORMAL LOW (ref 0.3–1.2)
Total Protein: 7.6 g/dL (ref 6.0–8.3)

## 2011-09-22 LAB — URINALYSIS, ROUTINE W REFLEX MICROSCOPIC
Glucose, UA: 100 mg/dL — AB
Ketones, ur: 15 mg/dL — AB
Nitrite: POSITIVE — AB
Protein, ur: >300 mg/dL — AB
Specific Gravity, Urine: 1.03 (ref 1.005–1.030)
Urobilinogen, UA: 1 mg/dL (ref 0.0–1.0)
pH: 5.5 (ref 5.0–8.0)

## 2011-09-22 LAB — URINE MICROSCOPIC-ADD ON

## 2011-09-22 LAB — GLUCOSE, CAPILLARY: Glucose-Capillary: 154 mg/dL — ABNORMAL HIGH (ref 70–99)

## 2011-09-22 MED ORDER — HYDROMORPHONE HCL PF 1 MG/ML IJ SOLN
1.0000 mg | Freq: Once | INTRAMUSCULAR | Status: AC
  Administered 2011-09-22: 1 mg via INTRAVENOUS
  Filled 2011-09-22: qty 1

## 2011-09-22 MED ORDER — ONDANSETRON HCL 4 MG/2ML IJ SOLN
4.0000 mg | Freq: Once | INTRAMUSCULAR | Status: AC
  Administered 2011-09-22: 4 mg via INTRAVENOUS
  Filled 2011-09-22: qty 2

## 2011-09-22 MED ORDER — KETOROLAC TROMETHAMINE 30 MG/ML IJ SOLN
30.0000 mg | Freq: Once | INTRAMUSCULAR | Status: AC
  Administered 2011-09-22: 30 mg via INTRAVENOUS
  Filled 2011-09-22: qty 1

## 2011-09-22 MED ORDER — SULFAMETHOXAZOLE-TRIMETHOPRIM 800-160 MG PO TABS: 1.0000 | ORAL_TABLET | Freq: Two times a day (BID) | ORAL | Status: AC

## 2011-09-22 NOTE — ED Notes (Signed)
Pt reports emesis x2 past 24 hours. Emesis was green.

## 2011-09-22 NOTE — ED Provider Notes (Signed)
History     CSN: [id number]  Arrival date & time 09/22/11  1845   First MD Initiated Contact with Patient 09/22/11 2005      Chief Complaint  Patient presents with  . Back Pain  . Hematuria    (Consider location/radiation/quality/duration/timing/severity/associated sxs/prior treatment) HPI  Patient presents to the ER with complaints of bladder pain. The patient has history of bladder pain in which she has a ureteroscopy scheduled for Sept 5. She comes to the hospital tomorrow for her pre-op appointment. She states that the pain is too severe that she can not wait a couple more days for the pain to be treated. She denies a change in the symptoms surrounding her symptoms. She has been having hematuria. She denies N/V/D, Fevers, chills and weight loss. She describes the pain as sharp.   Past Medical History  Diagnosis Date  . Hypertension   . Asthma   . Seizures   . Stroke     last 2003  . Heart murmur     born with   . Dysrhythmia     skipped beats  . Myocardial infarction     hi of x 2  last one 2003   . COPD (chronic obstructive pulmonary disease)     chronic bronchitis   . Pneumonia     hx of 2009  . GERD (gastroesophageal reflux disease)   . Nephrolithiasis     frequent urination , urination at nite   . Headache     migraines   . Neuropathy     due to diabetes   . Dizziness     secondary to diabetes and hypertension   . Arthritis     joint pain   . Hx of blood clots     hematomas removed from left side of brain from 68mo to 89yrold   . Epilepsy idiopathic petit mal     last seizure 2012;controlled w/ topomax  . Diabetic neuropathy, painful   . Diabetes mellitus     since age 89type 2 IDDM  . Sleep apnea     [facility]does not use Cpap ; mild  . CVA 11/14/2007    Qualifier: Diagnosis of  By: MaHassell DoneNP, NyTori Milks  . MIGRAINE HEADACHE 09/14/2006    Qualifier: Diagnosis of  By: MaHassell DoneNP, NyTori Milks  . Glaucoma   . Legally blind       left eye     Past Surgical History  Procedure Date  . Kidney stone removal   . Brain hematoma evacutation     five procedures total, first procedure when [age]onths old, last at [age]ears of age.  . Shunt removal     shunt inserted at age [age]emoved at age 46 . Eye surgery   . Retinal detachment surgery 1990  . Vaginal hysterectomy 1996  . Incisional hernia repair 05/05/2011    Procedure: LAPAROSCOPIC INCISIONAL HERNIA;  Surgeon: MaRolm BookbinderMD;  [facility];  Laterality: N/A;  . Mass excision 05/05/2011    Procedure: EXCISION MASS;  Surgeon: MaRolm BookbinderMD;  [facility];  Laterality: Right;  . Hernia repair 05/05/11    lap incisional hernia  . Pcnl   . Cystoscopy/retrograde/ureteroscopy 08/24/2011    Procedure: CYSTOSCOPY/RETROGRADE/URETEROSCOPY;  Surgeon: DaMolli HazardMD;  Location: [facility];  Service: Urology;  Laterality: Right;  Cysto, Right retrograde Pyelogram, right stent placement.  Family History  Problem Relation Age of Onset  . Cancer Mother     uterine  . Cancer Maternal Grandmother     brain  . Anesthesia problems Neg Hx     History  Substance Use Topics  . Smoking status: Former Smoker -- 1.0 packs/day for 30 years    Types: Cigarettes    Quit date: 01/20/1987  . Smokeless tobacco: Never Used  . Alcohol Use: No    OB History    Grav Para Term Preterm Abortions TAB SAB Ect Mult Living                  Review of Systems  Review of Systems  Gen: no weight loss, fevers, chills, night sweats  Eyes: no discharge or drainage, no occular pain or visual changes  Nose: no epistaxis or rhinorrhea  Mouth: no dental pain, no sore throat  Neck: no neck pain  Lungs:No wheezing, coughing or hemoptysis CV: no chest pain, palpitations, dependent edema or orthopnea  Abd: no abdominal pain, nausea, vomiting  GU: no dysuria, +  Hematuria, + bladder pain  MSK:  No abnormalities  Neuro: no  headache, no focal neurologic deficits  Skin: no abnormalities Psyche: negative.    Allergies  Codeine  Home Medications   Current Outpatient Rx  Name Route Sig Dispense Refill  . ALBUTEROL SULFATE HFA 108 (90 BASE) MCG/ACT IN AERS Inhalation Inhale 2 puffs into the lungs every 6 (six) hours as needed. Wheezing     . BACLOFEN 10 MG PO TABS Oral Take 10 mg by mouth 3 (three) times daily.    . BUDESONIDE-FORMOTEROL FUMARATE 160-4.5 MCG/ACT IN AERO Inhalation Inhale 2 puffs into the lungs 2 (two) times daily.    [name] GABAPENTIN 300 MG PO CAPS Oral Take 300 mg by mouth 3 (three) times daily.    [name] GLIPIZIDE ER 10 MG PO TB24 Oral Take 20 mg by mouth every morning.     . INSULIN ASPART 100 UNIT/ML [place] SOLN Subcutaneous Inject 2-14 Units into the skin 4 (four) times daily. Sliding scale    . INSULIN DETEMIR 100 UNIT/ML  SOLN Subcutaneous Inject 30-40 Units into the skin 2 (two) times daily. 40 units every morning and 30 units every night.    [name] LEVAQUIN PO Oral Take 1 tablet by mouth daily.    [name] LOSARTAN POTASSIUM-HCTZ 100-12.5 MG PO TABS Oral Take 1 tablet by mouth daily.    [name] OMEPRAZOLE 20 MG PO CPDR Oral Take 20 mg by mouth daily.    [name] ONDANSETRON HCL 8 MG PO TABS Oral Take 8 mg by mouth every 8 (eight) hours as needed. Nausea.    . OXYBUTYNIN CHLORIDE ER 10 MG PO TB24 Oral Take 10 mg by mouth daily.    [name] POLYETHYLENE GLYCOL 3350 PO PACK Oral Take 17 g by mouth daily as needed. Constipation.    [name] RANITIDINE HCL 150 MG PO TABS Oral Take 150 mg by mouth 2 (two) times daily.    [name] SIMVASTATIN 20 MG PO TABS Oral Take 20 mg by mouth daily.    [name] TAMSULOSIN HCL 0.4 MG PO CAPS Oral Take 1 capsule (0.4 mg total) by mouth daily after breakfast. 30 capsule 0  . TOPIRAMATE 200 MG PO TABS Oral Take 200 mg by mouth 2 (two) times daily.    . TRAZODONE HCL 150 MG PO TABS Oral Take 150 mg by mouth at bedtime.    [name] PROMETHAZINE HCL 25 MG RE SUPP Rectal  Place 1 suppository (25 mg total) rectally every 6 (six)  hours as needed for nausea. 12 each 0  . SULFAMETHOXAZOLE-TRIMETHOPRIM 800-160 MG PO TABS Oral Take 1 tablet by mouth every 12 (twelve) hours. 20 tablet 0    BP 119/69  Pulse 71  Temp 98.4 F (36.9 C) (Oral)  Resp 20  SpO2 97%  Physical Exam  Nursing note and vitals reviewed. Constitutional: She appears well-developed and well-nourished. No distress.  HENT:  Head: Normocephalic and atraumatic.  Eyes: Pupils are equal, round, and reactive to light.  Neck: Normal range of motion. Neck supple.  Cardiovascular: Normal rate and regular rhythm.   Pulmonary/Chest: Effort normal.  Abdominal: Soft. There is tenderness in the suprapubic area.    Neurological: She is alert.  Skin: Skin is warm and dry.    ED Course  Procedures (including critical care time)  Labs Reviewed  URINALYSIS, ROUTINE W REFLEX MICROSCOPIC - Abnormal; Notable for the following:    Color, Urine RED (*)  BIOCHEMICALS MAY BE AFFECTED BY COLOR   APPearance TURBID (*)     Glucose, UA 100 (*)     Hgb urine dipstick LARGE (*)     Bilirubin Urine LARGE (*)     Ketones, ur 15 (*)     Protein, ur >300 (*)     Nitrite POSITIVE (*)     Leukocytes, UA MODERATE (*)     All other components within normal limits  COMPREHENSIVE METABOLIC PANEL - Abnormal; Notable for the following:    Glucose, Bld 162 (*)     Total Bilirubin 0.2 (*)     GFR calc non Af Amer 75 (*)     GFR calc Af Amer 87 (*)     All other components within normal limits  GLUCOSE, CAPILLARY - Abnormal; Notable for the following:    Glucose-Capillary 154 (*)     All other components within normal limits  CBC WITH DIFFERENTIAL  URINE MICROSCOPIC-ADD ON  URINE CULTURE   No results found.   1. Hematuria   2. UTI (lower urinary tract infection)       MDM  I have consulted with Urology, Dr. Novella Olive, who has asked me to start patient on antibiotic. He says that the office will contact her tomorrow for further recommendations on abx, surgery date,  pain medication.   Urine culture sent out and pain treated in the ED.  Pt has been advised of the symptoms that warrant their return to the ED. Patient has voiced understanding and has agreed to follow-up with the PCP or specialist.         Linus Mako, [place] 09/23/11 0008

## 2011-09-22 NOTE — ED Notes (Signed)
Pt is aware of the need for urine but states she urinated prior to arrival. Pt has call bell and has been instructed to call when she feels she can give a sample.

## 2011-09-22 NOTE — ED Notes (Signed)
Pt placed on bedpan in attempt to collect a sample

## 2011-09-22 NOTE — ED Notes (Signed)
Pt states she is supposed to have a right ureteroscopy on 9/5. States she is having severe back pain and blood in urine and cannot wait that long.

## 2011-09-22 NOTE — ED Notes (Signed)
Pt reports scheduled uteroscopy 09/24/2011 to "figure out what's going on with kidneys". Hx of kidney stones on right kidneys which are current as well. Stent placed on right kidney 3 weeks prior. 3 years prior they took out 3 large kidney stones from right kidneys via sx. Pt states, "while I was in the hospital they tried to give me medication to try and pass stones, but I couldn't pass it." Pt could not handle the pain. Pt reports having urinated 3x past 24 hours and reports bloody urine.

## 2011-09-23 ENCOUNTER — Encounter (HOSPITAL_COMMUNITY)
Admission: RE | Admit: 2011-09-23 | Discharge: 2011-09-23 | Disposition: A | Discharge status: Home / Self Care | Payer: PRIVATE HEALTH INSURANCE | Source: Ambulatory Visit | Attending: Urology | Admitting: Urology

## 2011-09-23 ENCOUNTER — Encounter (HOSPITAL_COMMUNITY): Payer: Self-pay | Admitting: Pharmacy Technician

## 2011-09-23 ENCOUNTER — Encounter (HOSPITAL_COMMUNITY): Payer: Self-pay

## 2011-09-23 DIAGNOSIS — R52 Pain, unspecified: Secondary | ICD-10-CM

## 2011-09-23 HISTORY — DX: Pain, unspecified: R52

## 2011-09-23 LAB — SURGICAL PCR SCREEN
MRSA, PCR: NEGATIVE
Staphylococcus aureus: NEGATIVE

## 2011-09-23 MED ORDER — SULFAMETHOXAZOLE-TMP DS 800-160 MG PO TABS
1.0000 | ORAL_TABLET | Freq: Once | ORAL | Status: AC
  Administered 2011-09-23: 1 via ORAL
  Filled 2011-09-23: qty 1

## 2011-09-23 NOTE — Patient Instructions (Signed)
YOUR SURGERY IS SCHEDULED ON:  Thursday  9/5 AT 11:30 AM  REPORT TO [place] SHORT STAY CENTER AT:  9:30 AM      PHONE # FOR SHORT STAY IS [phone] DR. Rumson ARE TO START THE ANTIBIOTIC GIVEN BY EMERGENCY ROOM DOCTOR  AND CONTINUE YOUR LEVAQUIN ANTIBIOTIC.  DO NOT EAT OR DRINK ANYTHING AFTER MIDNIGHT THE NIGHT BEFORE YOUR SURGERY.  YOU MAY BRUSH YOUR TEETH, RINSE OUT YOUR MOUTH--BUT NO WATER, NO FOOD, NO CHEWING GUM, NO MINTS, NO CANDIES, NO CHEWING TOBACCO.  PLEASE TAKE THE FOLLOWING MEDICATIONS THE AM OF YOUR SURGERY WITH A FEW SIPS OF WATER:  BACLOFEN, GABAPENTIN, OMEPRAZOLE, ZOFRAN, ZANTAC, SIMVASTATIN.  USE YOUR SYMBICORT INHALER AND YOUR ALBUTEROL INHALER.   BRING YOUR ALBUTEROL INHALER AND TAKE TO SURGERY.    IF YOU USE INHALERS--USE YOUR INHALERS THE AM OF YOUR SURGERY AND BRING INHALERS TO [place] TO SURGERY.    IF YOU ARE DIABETIC:  DO NOT TAKE ANY DIABETIC MEDICATIONS THE AM OF YOUR SURGERY.  IF YOU TAKE INSULIN IN THE EVENINGS--PLEASE ONLY TAKE 1/2 NORMAL EVENING DOSE THE NIGHT BEFORE YOUR SURGERY.  NO INSULIN THE AM OF YOUR SURGERY.   DO NOT BRING VALUABLES, MONEY, CREDIT CARDS.  CONTACT LENS, DENTURES / PARTIALS, GLASSES SHOULD NOT BE WORN TO SURGERY AND IN MOST CASES-HEARING AIDS WILL NEED TO BE REMOVED.  BRING YOUR GLASSES CASE, ANY EQUIPMENT NEEDED FOR YOUR CONTACT LENS. FOR PATIENTS ADMITTED TO THE HOSPITAL--CHECK OUT TIME THE DAY OF DISCHARGE IS 11:00 AM.  ALL INPATIENT ROOMS ARE PRIVATE - WITH BATHROOM, TELEPHONE, TELEVISION AND WIFI INTERNET. IF YOU ARE BEING DISCHARGED THE SAME DAY OF YOUR SURGERY--YOU CAN NOT DRIVE YOURSELF HOME--AND SHOULD NOT GO HOME ALONE BY TAXI OR BUS.  NO DRIVING OR OPERATING MACHINERY FOR 24 HOURS FOLLOWING ANESTHESIA / PAIN MEDICATIONS.                            SPECIAL INSTRUCTIONS:  CHLORHEXIDINE SOAP SHOWER (other brand names are Betasept and Hibiclens ) PLEASE SHOWER WITH CHLORHEXIDINE  THE NIGHT BEFORE YOUR SURGERY AND THE AM OF YOUR SURGERY. DO NOT USE CHLORHEXIDINE ON YOUR FACE OR PRIVATE AREAS--YOU MAY USE YOUR NORMAL SOAP THOSE AREAS AND YOUR NORMAL SHAMPOO.  WOMEN SHOULD AVOID SHAVING UNDER ARMS AND SHAVING LEGS 72 HOURS BEFORE USING CHLORHEXIDINE TO AVOID SKIN IRRITATION.  DO NOT USE IF ALLERGIC TO CHLORHEXIDINE.  PLEASE READ OVER ANY  FACT SHEETS THAT YOU WERE GIVEN: MRSA INFORMATION  PT [name] -PHONE # [phone]   AND PT'S [name]  - PHONE #  [phone]  --WILL BOTH BE WITH HER DAY OF SURGERY AND DAUGHTER CAN DRIVE HER HOME.  PT AWARE THAT SHE SHOULD HAVE SOMEONE TO BE WITH HER AT HOME AFTER HER SURGERY.

## 2011-09-23 NOTE — Pre-Procedure Instructions (Signed)
PT STATES SHE WAS IN ER YESTERDAY - TOLD SHE HAD UTI - SAID SHE WAS TOLD NOT TO START NEW ANTIBIOTIC UNTIL SHE HEARD FROM DR. WRENN--THAT HE MIGHT BE DOING HER SURGERY TODAY - OR ADMITTING HER TO THE HOSPITAL.  PT STATES SHE HAS PAIN IN HER LOWER BACK AND RIGHT SIDE - HAS A RIGHT URETERAL STONE.  SPOKE WITH PAM AT DR. Ralene Muskrat OFFICE  REGARDING PT'S ER VISIT AND PT WAITING TO HEAR FROM DR. WRENN.  PAM SPOKE WITH DR. Windell Moulding WANTS PT TO GO HOME - HER SURGERY WILL STILL BE TOMORROW--SHE SHOULD START HER ANTIBIOTIC GIVEN BY ER AND CONTINUE HER LEVAQUIN.  PT THEN ASKING FOR PRESCRIPTION FOR PAIN MEDICATION--STATES SHE DOES NOT HAVE PAIN MED AT HOME--NOTIFIED PAM--PAM WILL GET IN TOUCH WITH DR. Jeffie Pollock AND CALL BACK. PT HAS CBC AND CMET AND UA REPORTS IN EPIC -DONE IN ER YESTERDAY 09/22/11 --URINE CULTURE RESULTS PENDING. PT HAS EKG REPORT IN EPIC FROM 05/05/11 AND A CXR REPORT FROM 05/20/11 IN EPIC. NO OTHER PREOP LABS NEEDED PER ANESTHESIOLOGIST'S GUIDELINES.  NASAL PCR WAS DONE TODAY. PREOP INSTRUCTIONS DISCUSSED WITH PT USING TEACH BACK METHOD.  PT UNDERSTANDS THAT SHE WILL NEED RESPONSIBLE DRIVER DAY OF SURGERY AND SOMEONE TO BE WITH HER WHEN SHE GOES HOME TOMORROW AFTER SURGERY.  PT STATES SHE ALWAYS HAS TO STAY ONE NIGHT AFTER SURGERIES.  PT MADE AWARE THAT SHE SHOULD MAKE PLANS TO GO HOME IF DR. Jeffie Pollock FEELS THAT SHE IS OK TO GO HOME AFTER SURGERY TOMORROW. PATIENT READY TO LEAVE PRESURGICAL TESTING--PAM HAS NOT CALLED BACK ABOUT PAIN MED-PT DECIDED TO GO ON HOME-STATES SHE HAS SOME ADVIL SHE CAN TAKE AT HOME.

## 2011-09-24 ENCOUNTER — Ambulatory Visit (HOSPITAL_COMMUNITY): Payer: PRIVATE HEALTH INSURANCE | Admitting: Anesthesiology

## 2011-09-24 ENCOUNTER — Ambulatory Visit (HOSPITAL_COMMUNITY)
Admission: RE | Admit: 2011-09-24 | Discharge: 2011-09-25 | Disposition: A | Discharge status: Home / Self Care | Payer: PRIVATE HEALTH INSURANCE | Source: Ambulatory Visit | Attending: Urology | Admitting: Urology

## 2011-09-24 ENCOUNTER — Encounter (HOSPITAL_COMMUNITY): Payer: Self-pay | Admitting: Anesthesiology

## 2011-09-24 ENCOUNTER — Encounter (HOSPITAL_COMMUNITY): Payer: Self-pay | Admitting: *Deleted

## 2011-09-24 ENCOUNTER — Encounter (HOSPITAL_COMMUNITY)
Admission: RE | Disposition: A | Discharge status: Home / Self Care | Payer: Self-pay | Source: Ambulatory Visit | Attending: Urology

## 2011-09-24 DIAGNOSIS — I10 Essential (primary) hypertension: Secondary | ICD-10-CM | POA: Insufficient documentation

## 2011-09-24 DIAGNOSIS — Z8673 Personal history of transient ischemic attack (TIA), and cerebral infarction without residual deficits: Secondary | ICD-10-CM | POA: Insufficient documentation

## 2011-09-24 DIAGNOSIS — Z79899 Other long term (current) drug therapy: Secondary | ICD-10-CM | POA: Insufficient documentation

## 2011-09-24 DIAGNOSIS — R110 Nausea: Secondary | ICD-10-CM | POA: Insufficient documentation

## 2011-09-24 DIAGNOSIS — N3946 Mixed incontinence: Secondary | ICD-10-CM | POA: Insufficient documentation

## 2011-09-24 DIAGNOSIS — N135 Crossing vessel and stricture of ureter without hydronephrosis: Secondary | ICD-10-CM | POA: Insufficient documentation

## 2011-09-24 DIAGNOSIS — E669 Obesity, unspecified: Secondary | ICD-10-CM | POA: Insufficient documentation

## 2011-09-24 DIAGNOSIS — Z01812 Encounter for preprocedural laboratory examination: Secondary | ICD-10-CM | POA: Insufficient documentation

## 2011-09-24 DIAGNOSIS — R569 Unspecified convulsions: Secondary | ICD-10-CM | POA: Insufficient documentation

## 2011-09-24 DIAGNOSIS — N12 Tubulo-interstitial nephritis, not specified as acute or chronic: Secondary | ICD-10-CM | POA: Insufficient documentation

## 2011-09-24 HISTORY — PX: CYSTOSCOPY WITH URETEROSCOPY: SHX5123

## 2011-09-24 HISTORY — PX: CYSTOSCOPY W/ RETROGRADES: SHX1426

## 2011-09-24 LAB — URINE CULTURE
Colony Count: NO GROWTH
Culture: NO GROWTH

## 2011-09-24 LAB — GLUCOSE, CAPILLARY
Glucose-Capillary: 166 mg/dL — ABNORMAL HIGH (ref 70–99)
Glucose-Capillary: 125 mg/dL — ABNORMAL HIGH (ref 70–99)
Glucose-Capillary: 145 mg/dL — ABNORMAL HIGH (ref 70–99)
Glucose-Capillary: 218 mg/dL — ABNORMAL HIGH (ref 70–99)
Glucose-Capillary: 199 mg/dL — ABNORMAL HIGH (ref 70–99)

## 2011-09-24 SURGERY — CYSTOSCOPY WITH URETEROSCOPY
Anesthesia: General | Site: Ureter | Laterality: Right | Wound class: Clean Contaminated

## 2011-09-24 MED ORDER — FAMOTIDINE 20 MG PO TABS
20.0000 mg | ORAL_TABLET | Freq: Every day | ORAL | Status: DC
  Administered 2011-09-25: 20 mg via ORAL
  Filled 2011-09-24: qty 1

## 2011-09-24 MED ORDER — ONDANSETRON HCL 4 MG/2ML IJ SOLN
INTRAMUSCULAR | Status: DC | PRN
  Administered 2011-09-24: 4 mg via INTRAVENOUS

## 2011-09-24 MED ORDER — IOHEXOL 300 MG/ML  SOLN
INTRAMUSCULAR | Status: AC
  Filled 2011-09-24: qty 1

## 2011-09-24 MED ORDER — KETOROLAC TROMETHAMINE 30 MG/ML IJ SOLN: 15.0000 mg | Freq: Once | INTRAMUSCULAR | Status: DC | PRN

## 2011-09-24 MED ORDER — MIDAZOLAM HCL 5 MG/5ML IJ SOLN
INTRAMUSCULAR | Status: DC | PRN
  Administered 2011-09-24: 2 mg via INTRAVENOUS

## 2011-09-24 MED ORDER — INSULIN ASPART 100 UNIT/ML ~~LOC~~ SOLN: 0.0000 [IU] | Freq: Every day | SUBCUTANEOUS | Status: DC

## 2011-09-24 MED ORDER — INSULIN DETEMIR 100 UNIT/ML ~~LOC~~ SOLN
30.0000 [IU] | Freq: Every day | SUBCUTANEOUS | Status: DC
  Administered 2011-09-24: 30 [IU] via SUBCUTANEOUS
  Filled 2011-09-24: qty 10

## 2011-09-24 MED ORDER — SIMVASTATIN 20 MG PO TABS
20.0000 mg | ORAL_TABLET | Freq: Every day | ORAL | Status: DC
Start: 2011-09-24 — End: 2011-09-25
  Administered 2011-09-24: 20 mg via ORAL
  Filled 2011-09-24 (×2): qty 1

## 2011-09-24 MED ORDER — INSULIN ASPART 100 UNIT/ML ~~LOC~~ SOLN: 2.0000 [IU] | Freq: Four times a day (QID) | SUBCUTANEOUS | Status: DC

## 2011-09-24 MED ORDER — KCL IN DEXTROSE-NACL 20-5-0.45 MEQ/L-%-% IV SOLN
INTRAVENOUS | Status: DC
  Administered 2011-09-24 – 2011-09-25 (×2): via INTRAVENOUS
  Filled 2011-09-24 (×4): qty 1000

## 2011-09-24 MED ORDER — FENTANYL CITRATE 0.05 MG/ML IJ SOLN
INTRAMUSCULAR | Status: DC | PRN
  Administered 2011-09-24 (×3): 25 ug via INTRAVENOUS

## 2011-09-24 MED ORDER — FENTANYL CITRATE 0.05 MG/ML IJ SOLN
INTRAMUSCULAR | Status: AC
  Filled 2011-09-24: qty 2

## 2011-09-24 MED ORDER — DOCUSATE SODIUM 100 MG PO CAPS
100.0000 mg | ORAL_CAPSULE | Freq: Two times a day (BID) | ORAL | Status: DC
  Administered 2011-09-24 – 2011-09-25 (×2): 100 mg via ORAL
  Filled 2011-09-24 (×3): qty 1

## 2011-09-24 MED ORDER — ZOLPIDEM TARTRATE 5 MG PO TABS: 5.0000 mg | ORAL_TABLET | Freq: Every evening | ORAL | Status: DC | PRN

## 2011-09-24 MED ORDER — ALBUTEROL SULFATE HFA 108 (90 BASE) MCG/ACT IN AERS: 2.0000 | INHALATION_SPRAY | Freq: Four times a day (QID) | RESPIRATORY_TRACT | Status: DC | PRN

## 2011-09-24 MED ORDER — LIDOCAINE HCL (CARDIAC) 20 MG/ML IV SOLN
INTRAVENOUS | Status: DC | PRN
  Administered 2011-09-24: 50 mg via INTRAVENOUS

## 2011-09-24 MED ORDER — SULFAMETHOXAZOLE-TMP DS 800-160 MG PO TABS
1.0000 | ORAL_TABLET | Freq: Two times a day (BID) | ORAL | Status: DC
  Administered 2011-09-24 – 2011-09-25 (×2): 1 via ORAL
  Filled 2011-09-24 (×3): qty 1

## 2011-09-24 MED ORDER — HYOSCYAMINE SULFATE 0.125 MG SL SUBL
0.1250 mg | SUBLINGUAL_TABLET | SUBLINGUAL | Status: DC | PRN
  Administered 2011-09-24: 0.125 mg via ORAL
  Filled 2011-09-24: qty 1

## 2011-09-24 MED ORDER — ACETAMINOPHEN 325 MG PO TABS: 650.0000 mg | ORAL_TABLET | ORAL | Status: DC | PRN

## 2011-09-24 MED ORDER — HYDROCHLOROTHIAZIDE 12.5 MG PO CAPS
12.5000 mg | ORAL_CAPSULE | Freq: Every day | ORAL | Status: DC
  Administered 2011-09-24 – 2011-09-25 (×2): 12.5 mg via ORAL
  Filled 2011-09-24 (×2): qty 1

## 2011-09-24 MED ORDER — PROPOFOL 10 MG/ML IV BOLUS
INTRAVENOUS | Status: DC | PRN
  Administered 2011-09-24: 150 mg via INTRAVENOUS

## 2011-09-24 MED ORDER — PROMETHAZINE HCL 25 MG/ML IJ SOLN: 6.2500 mg | INTRAMUSCULAR | Status: DC | PRN

## 2011-09-24 MED ORDER — GLIPIZIDE ER 10 MG PO TB24
20.0000 mg | ORAL_TABLET | Freq: Every day | ORAL | Status: DC
  Administered 2011-09-25: 20 mg via ORAL
  Filled 2011-09-24 (×2): qty 2

## 2011-09-24 MED ORDER — HYDROMORPHONE HCL PF 1 MG/ML IJ SOLN: 0.5000 mg | INTRAMUSCULAR | Status: DC | PRN

## 2011-09-24 MED ORDER — CIPROFLOXACIN IN D5W 400 MG/200ML IV SOLN
INTRAVENOUS | Status: AC
  Filled 2011-09-24: qty 200

## 2011-09-24 MED ORDER — ACETAMINOPHEN 10 MG/ML IV SOLN
INTRAVENOUS | Status: AC
  Filled 2011-09-24: qty 100

## 2011-09-24 MED ORDER — TRAZODONE HCL 150 MG PO TABS
150.0000 mg | ORAL_TABLET | Freq: Every day | ORAL | Status: DC
  Administered 2011-09-24: 150 mg via ORAL
  Filled 2011-09-24 (×2): qty 1

## 2011-09-24 MED ORDER — GABAPENTIN 300 MG PO CAPS
600.0000 mg | ORAL_CAPSULE | Freq: Every morning | ORAL | Status: DC
  Administered 2011-09-25: 600 mg via ORAL
  Filled 2011-09-24: qty 2

## 2011-09-24 MED ORDER — BACLOFEN 10 MG PO TABS
10.0000 mg | ORAL_TABLET | Freq: Three times a day (TID) | ORAL | Status: DC
  Administered 2011-09-24 – 2011-09-25 (×3): 10 mg via ORAL
  Filled 2011-09-24 (×5): qty 1

## 2011-09-24 MED ORDER — LOSARTAN POTASSIUM 50 MG PO TABS
100.0000 mg | ORAL_TABLET | Freq: Every day | ORAL | Status: DC
  Administered 2011-09-24 – 2011-09-25 (×2): 100 mg via ORAL
  Filled 2011-09-24 (×2): qty 2

## 2011-09-24 MED ORDER — ONDANSETRON HCL 4 MG/2ML IJ SOLN
4.0000 mg | INTRAMUSCULAR | Status: DC | PRN
  Administered 2011-09-24: 4 mg via INTRAVENOUS
  Filled 2011-09-24: qty 2

## 2011-09-24 MED ORDER — ONDANSETRON HCL 8 MG PO TABS
8.0000 mg | ORAL_TABLET | ORAL | Status: DC
  Administered 2011-09-24 – 2011-09-25 (×3): 8 mg via ORAL
  Filled 2011-09-24 (×5): qty 1

## 2011-09-24 MED ORDER — PANTOPRAZOLE SODIUM 40 MG PO TBEC
40.0000 mg | DELAYED_RELEASE_TABLET | Freq: Every day | ORAL | Status: DC
  Administered 2011-09-25: 40 mg via ORAL
  Filled 2011-09-24: qty 1

## 2011-09-24 MED ORDER — HYDROMORPHONE HCL 2 MG PO TABS: 2.0000 mg | ORAL_TABLET | Freq: Four times a day (QID) | ORAL | Status: AC | PRN

## 2011-09-24 MED ORDER — BUDESONIDE-FORMOTEROL FUMARATE 160-4.5 MCG/ACT IN AERO
2.0000 | INHALATION_SPRAY | Freq: Two times a day (BID) | RESPIRATORY_TRACT | Status: DC
  Administered 2011-09-24 – 2011-09-25 (×2): 2 via RESPIRATORY_TRACT
  Filled 2011-09-24: qty 6

## 2011-09-24 MED ORDER — ACETAMINOPHEN 10 MG/ML IV SOLN
INTRAVENOUS | Status: DC | PRN
  Administered 2011-09-24: 1000 mg via INTRAVENOUS

## 2011-09-24 MED ORDER — TOPIRAMATE 100 MG PO TABS
200.0000 mg | ORAL_TABLET | Freq: Two times a day (BID) | ORAL | Status: DC
  Administered 2011-09-24 – 2011-09-25 (×2): 200 mg via ORAL
  Filled 2011-09-24 (×3): qty 2

## 2011-09-24 MED ORDER — IOHEXOL 300 MG/ML  SOLN
INTRAMUSCULAR | Status: DC | PRN
  Administered 2011-09-24: 10 mL

## 2011-09-24 MED ORDER — LOSARTAN POTASSIUM-HCTZ 100-12.5 MG PO TABS: 1.0000 | ORAL_TABLET | Freq: Every day | ORAL | Status: DC

## 2011-09-24 MED ORDER — INSULIN ASPART 100 UNIT/ML ~~LOC~~ SOLN
0.0000 [IU] | Freq: Three times a day (TID) | SUBCUTANEOUS | Status: DC
  Administered 2011-09-24: 5 [IU] via SUBCUTANEOUS
  Administered 2011-09-25 (×2): 3 [IU] via SUBCUTANEOUS

## 2011-09-24 MED ORDER — LACTATED RINGERS IV SOLN
INTRAVENOUS | Status: DC
  Administered 2011-09-24: 1000 mL via INTRAVENOUS

## 2011-09-24 MED ORDER — SODIUM CHLORIDE 0.9 % IR SOLN
Status: DC | PRN
  Administered 2011-09-24: 4000 mL

## 2011-09-24 MED ORDER — CIPROFLOXACIN IN D5W 400 MG/200ML IV SOLN
400.0000 mg | INTRAVENOUS | Status: AC
  Administered 2011-09-24: 400 mg via INTRAVENOUS

## 2011-09-24 MED ORDER — PNEUMOCOCCAL VAC POLYVALENT 25 MCG/0.5ML IJ INJ
0.5000 mL | INJECTION | INTRAMUSCULAR | Status: AC
  Administered 2011-09-25: 0.5 mL via INTRAMUSCULAR
  Filled 2011-09-24: qty 0.5

## 2011-09-24 MED ORDER — FENTANYL CITRATE 0.05 MG/ML IJ SOLN
25.0000 ug | INTRAMUSCULAR | Status: DC | PRN
  Administered 2011-09-24 (×2): 50 ug via INTRAVENOUS

## 2011-09-24 MED ORDER — HYDROMORPHONE HCL 2 MG PO TABS
4.0000 mg | ORAL_TABLET | Freq: Four times a day (QID) | ORAL | Status: DC | PRN
  Administered 2011-09-24 – 2011-09-25 (×4): 4 mg via ORAL
  Filled 2011-09-24 (×4): qty 2

## 2011-09-24 MED ORDER — SULFAMETHOXAZOLE-TRIMETHOPRIM 800-160 MG PO TABS: 1.0000 | ORAL_TABLET | Freq: Two times a day (BID) | ORAL | Status: DC

## 2011-09-24 MED ORDER — DEXTROSE 5 % IV SOLN
2.0000 g | INTRAVENOUS | Status: AC
  Administered 2011-09-24: 2 g via INTRAVENOUS
  Filled 2011-09-24: qty 2

## 2011-09-24 MED ORDER — CLOPIDOGREL BISULFATE 75 MG PO TABS
75.0000 mg | ORAL_TABLET | Freq: Every day | ORAL | Status: DC
  Administered 2011-09-24 – 2011-09-25 (×2): 75 mg via ORAL
  Filled 2011-09-24 (×2): qty 1

## 2011-09-24 MED ORDER — INSULIN DETEMIR 100 UNIT/ML ~~LOC~~ SOLN
40.0000 [IU] | Freq: Every day | SUBCUTANEOUS | Status: DC
  Administered 2011-09-25: 40 [IU] via SUBCUTANEOUS

## 2011-09-24 MED ORDER — POLYETHYLENE GLYCOL 3350 17 G PO PACK
17.0000 g | PACK | Freq: Every day | ORAL | Status: DC | PRN
  Filled 2011-09-24: qty 1

## 2011-09-24 SURGICAL SUPPLY — 15 items
BAG URO CATCHER STRL LF (DRAPE) ×3 IMPLANT
BASKET LASER NITINOL 1.9FR (BASKET) IMPLANT
CATH URET 5FR 28IN OPEN ENDED (CATHETERS) IMPLANT
CLOTH BEACON ORANGE TIMEOUT ST (SAFETY) ×3 IMPLANT
DRAPE CAMERA CLOSED 9X96 (DRAPES) ×3 IMPLANT
GLOVE SURG SS PI 8.0 STRL IVOR (GLOVE) ×3 IMPLANT
GOWN PREVENTION PLUS XLARGE (GOWN DISPOSABLE) ×3 IMPLANT
GOWN STRL REIN XL XLG (GOWN DISPOSABLE) ×3 IMPLANT
GUIDEWIRE STR DUAL SENSOR (WIRE) ×3 IMPLANT
KIT BALLIN UROMAX 15FX10 (LABEL) ×2 IMPLANT
MANIFOLD NEPTUNE II (INSTRUMENTS) ×3 IMPLANT
PACK CYSTO (CUSTOM PROCEDURE TRAY) ×3 IMPLANT
SET HIGH PRES BAL DIL (LABEL) ×1
STENT CONTOUR 8FR X 24 (STENTS) ×3 IMPLANT
TUBING CONNECTING 10 (TUBING) ×3 IMPLANT

## 2011-09-24 NOTE — Transfer of Care (Signed)
Immediate Anesthesia Transfer of Care Note  Patient: [name]  Procedure(s) Performed: Procedure(s) (LRB): CYSTOSCOPY WITH URETEROSCOPY (Right) CYSTOSCOPY WITH RETROGRADE PYELOGRAM (Right) CYSTOSCOPY WITH STENT PLACEMENT (Right)  Patient Location: PACU  Anesthesia Type: General  Level of Consciousness: sedated, patient cooperative and responds to stimulaton  Airway & Oxygen Therapy: Patient Spontanous Breathing and Patient connected to face mask oxgen  Post-op Assessment: Report given to PACU RN and Post -op Vital signs reviewed and stable  Post vital signs: Reviewed and stable  Complications: No apparent anesthesia complications

## 2011-09-24 NOTE — Progress Notes (Signed)
Patient ID: [name], female   DOB: 10-23-1965, 46 y.o.   MRN: [id number]  [name] is having some nausea post op but not much pain.  She should be able to go home tomorrow and her pain med script has been printed.

## 2011-09-24 NOTE — Brief Op Note (Signed)
09/24/2011  12:42 PM  PATIENT:  [name]  46 y.o. female  PRE-OPERATIVE DIAGNOSIS:  RIGHT URETERAL STRICTURE  POST-OPERATIVE DIAGNOSIS:  SAME  PROCEDURE:  Procedure(s) (LRB) with comments: CYSTOSCOPY WITH URETEROSCOPY (Right) - Balloon dilation right ureter  CYSTOSCOPY WITH RETROGRADE PYELOGRAM (Right) RIGHT URETERAL STENT REMOVAL AND REPLACEMENT  SURGEON:  Surgeon(s) and Role:    * Malka So, MD - Primary  PHYSICIAN ASSISTANT:   ASSISTANTS: none   ANESTHESIA:   general  EBL:     BLOOD ADMINISTERED:none  DRAINS: [id number] RIGHT JJ STENT   LOCAL MEDICATIONS USED:  NONE  SPECIMEN:  No Specimen  DISPOSITION OF SPECIMEN:  N/A  COUNTS:  YES  TOURNIQUET:  * No tourniquets in log *  DICTATION: .Other Dictation: Dictation Number [id number]  PLAN OF CARE: Admit for overnight observation  PATIENT DISPOSITION:  PACU - hemodynamically stable.   Delay start of Pharmacological VTE agent (>24hrs) due to surgical blood loss or risk of bleeding: no

## 2011-09-24 NOTE — Interval H&P Note (Signed)
History and Physical Interval Note:  09/24/2011 11:53 AM  [name]  has presented today for surgery, with the diagnosis of RIGHT URETERAL STRICTURE  The various methods of treatment have been discussed with the patient and family. After consideration of risks, benefits and other options for treatment, the patient has consented to  Procedure(s) (LRB) with comments: CYSTOSCOPY WITH URETEROSCOPY (Right) - NEED FLEX DIG URETEROSCOPE  as a surgical intervention .  The patient's history has been reviewed, patient examined, no change in status, stable for surgery.  I have reviewed the patient's chart and labs.  Questions were answered to the patient's satisfaction.     [name] J

## 2011-09-24 NOTE — ED Provider Notes (Signed)
Medical screening examination/treatment/procedure(s) were performed by non-physician practitioner and as supervising physician I was immediately available for consultation/collaboration.  Perlie Mayo, MD 09/24/11 [phone]

## 2011-09-24 NOTE — Anesthesia Preprocedure Evaluation (Signed)
Anesthesia Evaluation  Patient identified by MRN, date of birth, ID band Patient awake    Reviewed: Allergy & Precautions, H&P , NPO status , Patient's Chart, lab work & pertinent test results  Airway Mallampati: II TM Distance: >3 FB Neck ROM: Full    Dental No notable dental hx.    Pulmonary sleep apnea , COPD COPD inhaler,  breath sounds clear to auscultation  Pulmonary exam normal       Cardiovascular hypertension, Pt. on medications + Past MI Rhythm:Regular Rate:Normal     Neuro/Psych Depression CVA    GI/Hepatic negative GI ROS, Neg liver ROS,   Endo/Other  diabetes, Insulin DependentMorbid obesity  Renal/GU negative Renal ROS  negative genitourinary   Musculoskeletal negative musculoskeletal ROS (+)   Abdominal   Peds negative pediatric ROS (+)  Hematology negative hematology ROS (+)   Anesthesia Other Findings   Reproductive/Obstetrics negative OB ROS                           Anesthesia Physical Anesthesia Plan  ASA: III  Anesthesia Plan: General   Post-op Pain Management:    Induction: Intravenous  Airway Management Planned: LMA  Additional Equipment:   Intra-op Plan:   Post-operative Plan:   Informed Consent: I have reviewed the patients History and Physical, chart, labs and discussed the procedure including the risks, benefits and alternatives for the proposed anesthesia with the patient or authorized representative who has indicated his/her understanding and acceptance.   Dental advisory given  Plan Discussed with: CRNA and Surgeon  Anesthesia Plan Comments:         Anesthesia Quick Evaluation

## 2011-09-24 NOTE — Anesthesia Postprocedure Evaluation (Signed)
  Anesthesia Post-op Note  Patient: [place] V Herbin  Procedure(s) Performed: Procedure(s) (LRB): CYSTOSCOPY WITH URETEROSCOPY (Right) CYSTOSCOPY WITH RETROGRADE PYELOGRAM (Right) CYSTOSCOPY WITH STENT PLACEMENT (Right)  Patient Location: PACU  Anesthesia Type: General  Level of Consciousness: awake and alert   Airway and Oxygen Therapy: Patient Spontanous Breathing  Post-op Pain: mild  Post-op Assessment: Post-op Vital signs reviewed, Patient's Cardiovascular Status Stable, Respiratory Function Stable, Patent Airway and No signs of Nausea or vomiting  Post-op Vital Signs: stable  Complications: No apparent anesthesia complications

## 2011-09-24 NOTE — H&P (View-Only) (Signed)
Patient ID: [name], female   DOB: 1965/12/18, 46 y.o.   MRN: [id number] 1 Day Post-Op  Subjective: [name] was found to have a right proximal ureteral stricture on retrograde pyelography.  A 4.57f stent was placed as a larger stent wouldn't pass.   She is having pain today but it bladder pain with exacerbation of her flank pain with voiding which is common with a stent.  She reports a history of mixed incontinence and has been on anticholinergics in the past without success. ROS: Negative except for the pain and incontinence as noted above.  Past history reviewed and updated.  Objective: Vital signs in last 24 hours: Temp:  [97.6 F (36.4 C)-98.4 F (36.9 C)] 97.7 F (36.5 C) (08/06 0435) Pulse Rate:  [76-95] 82  (08/06 0435) Resp:  [16-20] 20  (08/06 0435) BP: (99-140)/(67-99) 99/67 mmHg (08/06 0435) SpO2:  [94 %-100 %] 96 % (08/06 0435)  Intake/Output from previous day: 08/05 0701 - 08/06 0700 In: 2310 [P.O.:360; I.V.:1900; IV Piggyback:50] Out: 1050 [Urine:1050] Intake/Output this shift:    General appearance: alert and no distress GI: soft obese with no flank tenderness but with RLQ tenderness.    Lab Results:   Basename 08/25/11 0403 08/23/11 0003  WBC 6.9 8.9  HGB 9.7* 10.1*  HCT 30.7* 31.3*  PLT 288 228   BMET  Basename 08/23/11 0003  NA 139  K 3.5  CL 107  CO2 23  GLUCOSE 179*  BUN 9  CREATININE 0.90  CALCIUM 8.6   PT/INR No results found for this basename: LABPROT:2,INR:2 in the last 72 hours ABG No results found for this basename: PHART:2,PCO2:2,PO2:2,HCO3:2 in the last 72 hours  Studies/Results: Ct Abdomen Pelvis W Contrast  08/23/2011  *RADIOLOGY REPORT*  Clinical Data: Hematuria and right flank pain.  CT ABDOMEN AND PELVIS WITH CONTRAST  Technique:  Multidetector CT imaging of the abdomen and pelvis was performed following the standard protocol during bolus administration of intravenous contrast.  Contrast: 1265mOMNIPAQUE IOHEXOL 300 MG/ML   SOLN  Comparison: 08/22/2011  Findings: Slight fibrosis or atelectasis in the lung bases. Minimal bilateral pleural effusions.  Since the previous study, there is increasing right-sided pyelocaliectasis and ureterectasis.  The wall of the ureter appears thickened and is enhancing.  No ureteral stones are demonstrated. Changes could represent obstruction due to occult stone, stricture, or pyelonephritis. Delayed views demonstrate a focal area of thickening in the mid right ureter at the level of L4 with loss of the urine column.  This could represent ureteral mass or focal peristalsis.  Consider retrograde pyelography if clinically indicated.  There is mild periureteral stranding.  Multiple intrarenal stones bilaterally are better visualized on the previous unenhanced scan. The left ureter and collecting system are decompressed.  Delayed views of the bladder demonstrate normal bladder filling without wall thickening or filling defect.  The liver, spleen, gallbladder, pancreas, left adrenal gland, abdominal aorta, and retroperitoneal lymph nodes are unremarkable. 2.1 cm nodule in the right adrenal gland.  This demonstrates a fat density on the unenhanced study obtained previously, consistent with an adenoma.  The stomach, small bowel, and colon are not abnormally distended.  Infiltration and gas focally in the anterior abdominal wall fat likely represents a site of injection.  No free air or free fluid in the abdomen.  Pelvis:  Uterus appears to be surgically absent.  Adnexal structures are not abnormally enlarged.  Suggestion of involuting cyst in the right ovary.  No free or loculated pelvic fluid collections.  No evidence of diverticulitis.  The appendix is normal.  Normal alignment of the lumbar vertebrae.  IMPRESSION: Increasing right-sided pyelocaliectasis and ureterectasis without stones visualized.  Possibility of focal obstructing lesion or thickening in the mid right ureter.  Changes may represent occult  stone, stricture, or mass.  Consider retrograde ureter gram for further evaluation if clinically indicated.  Original Report Authenticated By: Neale Burly, M.D.    Anti-infectives: Anti-infectives     Start     Dose/Rate Route Frequency Ordered Stop   08/22/11 2300   cefTRIAXone (ROCEPHIN) 1 g in dextrose 5 % 50 mL IVPB        1 g 100 mL/hr over 30 Minutes Intravenous Every 24 hours 08/22/11 0814     08/22/11 0700   cefTRIAXone (ROCEPHIN) 1 g in dextrose 5 % 50 mL IVPB  Status:  Discontinued        1 g 100 mL/hr over 30 Minutes Intravenous Every 24 hours 08/22/11 0649 08/22/11 0814   08/21/11 2345   cefTRIAXone (ROCEPHIN) 1 g in dextrose 5 % 50 mL IVPB        1 g 100 mL/hr over 30 Minutes Intravenous  Once 08/21/11 2331 08/22/11 0033          Current Facility-Administered Medications  Medication Dose Route Frequency Provider Last Rate Last Dose  . 0.9 %  sodium chloride infusion   Intravenous Continuous Reyne Dumas, MD 100 mL/hr at 08/25/11 0237    . acetaminophen (TYLENOL) tablet 650 mg  650 mg Oral Q6H PRN Elwyn Reach, MD   650 mg at 08/23/11 1401   Or  . acetaminophen (TYLENOL) suppository 650 mg  650 mg Rectal Q6H PRN Elwyn Reach, MD      . baclofen (LIORESAL) tablet 10 mg  10 mg Oral TID Elwyn Reach, MD   10 mg at 08/24/11 2328  . cefTRIAXone (ROCEPHIN) 1 g in dextrose 5 % 50 mL IVPB  1 g Intravenous Q24H Elwyn Reach, MD   1 g at 08/25/11 0001  . famotidine (PEPCID) tablet 10 mg  10 mg Oral Daily Elwyn Reach, MD   10 mg at 08/24/11 0924  . gabapentin (NEURONTIN) capsule 300 mg  300 mg Oral TID Elwyn Reach, MD   300 mg at 08/24/11 2329  . glipiZIDE (GLUCOTROL XL) 24 hr tablet 20 mg  20 mg Oral Daily Elwyn Reach, MD   20 mg at 08/24/11 0924  . insulin aspart (novoLOG) injection 0-15 Units  0-15 Units Subcutaneous TID WC Elwyn Reach, MD   2 Units at 08/24/11 0757  . insulin aspart (novoLOG) injection 0-5 Units  0-5 Units  Subcutaneous QHS Elwyn Reach, MD      . insulin detemir (LEVEMIR) injection 30 Units  30 Units Subcutaneous QHS Elwyn Reach, MD   30 Units at 08/24/11 2330  . insulin detemir (LEVEMIR) injection 40 Units  40 Units Subcutaneous Daily Elwyn Reach, MD   40 Units at 08/23/11 0944  . morphine 2 MG/ML injection 2 mg  2 mg Intravenous Q4H PRN Elwyn Reach, MD   2 mg at 08/25/11 0657  . ondansetron (ZOFRAN) tablet 4 mg  4 mg Oral Q6H PRN Elwyn Reach, MD       Or  . ondansetron (ZOFRAN) injection 4 mg  4 mg Intravenous Q6H PRN Elwyn Reach, MD      . oxybutynin (DITROPAN-XL) 24 hr tablet 10 mg  10  mg Oral QHS Malka So, MD      . phenazopyridine (PYRIDIUM) tablet 200 mg  200 mg Oral TID PRN Malka So, MD      . psyllium (HYDROCIL/METAMUCIL) packet 1 packet  1 packet Oral BID Reyne Dumas, MD   1 packet at 08/24/11 2200  . Tamsulosin HCl (FLOMAX) capsule 0.4 mg  0.4 mg Oral QPC breakfast Reyne Dumas, MD   0.4 mg at 08/24/11 0923  . topiramate (TOPAMAX) tablet 200 mg  200 mg Oral BID Elwyn Reach, MD   200 mg at 08/24/11 2329  . traZODone (DESYREL) tablet 150 mg  150 mg Oral QHS Elwyn Reach, MD   150 mg at 08/24/11 2329  . DISCONTD: clopidogrel (PLAVIX) tablet 75 mg  75 mg Oral Daily Elwyn Reach, MD   75 mg at 08/23/11 0944  . DISCONTD: enoxaparin (LOVENOX) injection 40 mg  40 mg Subcutaneous Daily Reyne Dumas, MD   40 mg at 08/23/11 1238  . DISCONTD: fentaNYL (SUBLIMAZE) injection 25-50 mcg  25-50 mcg Intravenous Q5 min PRN Salley Scarlet, MD      . DISCONTD: iohexol (OMNIPAQUE) 300 MG/ML solution    PRN Molli Hazard, MD   10 mL at 08/24/11 1517  . DISCONTD: lidocaine (XYLOCAINE) 2 % jelly    PRN Molli Hazard, MD   1 application at 47/09/62 1506  . DISCONTD: promethazine (PHENERGAN) injection 6.25-12.5 mg  6.25-12.5 mg Intravenous Q15 min PRN Salley Scarlet, MD      . DISCONTD: sterile water for irrigation for irrigation    PRN Molli Hazard, MD   3,000 mL at 08/24/11 1441   Facility-Administered Medications Ordered in Other Encounters  Medication Dose Route Frequency Provider Last Rate Last Dose  . DISCONTD: fentaNYL (SUBLIMAZE) injection    PRN Anne Fu, CRNA   25 mcg at 08/24/11 1507  . DISCONTD: lactated ringers infusion    Continuous PRN Anne Fu, CRNA      . DISCONTD: lidocaine (cardiac) 100 mg/60m (XYLOCAINE) 20 MG/ML injection 2%    PRN SAnne Fu CRNA   100 mg at 08/24/11 1447  . DISCONTD: metoCLOPramide (REGLAN) injection    PRN SAnne Fu CRNA   10 mg at 08/24/11 1505  . DISCONTD: midazolam (VERSED) 5 MG/5ML injection    PRN SAnne Fu CRNA   2 mg at 08/24/11 1440  . DISCONTD: ondansetron (ZOFRAN) injection    PRN SAnne Fu CRNA   4 mg at 08/24/11 1519  . DISCONTD: propofol (DIPRIVAN) 10 mg/mL bolus/IV push    PRN SAnne Fu CRNA   180 mg at 08/24/11 1448    Assessment: s/p Procedure(s): CYSTOSCOPY/RETROGRADE/URETEROSCOPY  Right ureteral stricture with obstruction and pyelonephritis. Post stent placement pain.  Plan: I am going to put her on oxybutynin ER 119mdaily and pyridium 20071mo tid prn for the bladder symptoms. She is going to need ureteroscopy for further evaluation and treatment of her right ureteral stricture in 2-3 weeks.  The current stent will help dilate the ureter for that procedure. Post discharge, she should probably be kept on a suppressive antibiotic with either nitrofurantoin 100m48mght or trimethoprim 100mg88mht after she completes her therapeutic course of medication to prevent recurrent infection prior to the repeat procedure. I think pain control will continue to be an issue even with the stent because of her chronic pain issues.   LOS: 4 days  Londell Noll J 08/25/2011

## 2011-09-25 ENCOUNTER — Encounter (HOSPITAL_COMMUNITY): Payer: Self-pay | Admitting: Urology

## 2011-09-25 LAB — GLUCOSE, CAPILLARY
Glucose-Capillary: 156 mg/dL — ABNORMAL HIGH (ref 70–99)
Glucose-Capillary: 184 mg/dL — ABNORMAL HIGH (ref 70–99)
Glucose-Capillary: 118 mg/dL — ABNORMAL HIGH (ref 70–99)

## 2011-09-25 NOTE — Discharge Summary (Signed)
  Date of admission: 09/24/2011  Date of discharge: 09/25/2011  Admission diagnosis: right ureteral stricture  Discharge diagnosis: same  Secondary diagnoses: HTN, obesity, seizures, and strokes  History and Physical: For full details, please see admission history and physical. Briefly, [name] is a 46 y.o. year old patient with a history of stones, who was admitted to [facility] last month with right flank pain. She was found to have a right proximal ureteral stricture. She  underwent stenting, it was quite tight and only a 4.7-French stent could  be passed. She returned for ureteroscopy and further management of  her stent.  Hospital Course:  Pt was admitted and taken to the OR on 09/24/11 for cysto/balloon dilation of right ureteral stricture/and exchange of right DJ stent.  Pt tolerated the procedure well and was hemodynamically stable immediately post op.  She was extubated without complication and woke up from anesthesia neurologically intact.  Pt was admitted for observation over night as she did not have anyone available to stay with her at home.  Her hospital course progressed as expected and her vitals have remained stable.  She is doing well except for nausea which has been present and persistent since her abdominal hernia repair in April 2013.  She has been able to eat/tolerate small meals.  On POD 1 she is doing well and stable for d/c home.   Laboratory values:  Basename 09/22/11 2025  HGB 12.1  HCT 37.3    Disposition: Home  Discharge instruction: The patient was instructed to be ambulatory but to refrain from heavy lifting, strenuous activity, or driving.  Discharge medications:  See medication reconciliation..med  Followup:  Follow-up Information    Follow up with Malka So, MD on 10/06/2011. [phone])    Contact information:   [address] [place] [phone]

## 2011-09-25 NOTE — Op Note (Signed)
[name], [name] NO.:  [id number]  MEDICAL RECORD NO.:  [id number]  LOCATION:  3474                         FACILITY:  [facility]  PHYSICIAN:  Marshall Cork. Jeffie Pollock, M.D.    DATE OF BIRTH:  17-Aug-1965  DATE OF PROCEDURE:  09/24/2011 DATE OF DISCHARGE:                              OPERATIVE REPORT   PROCEDURE:  Cystoscopy, removal of right double-J stent, right ureteral pyeloscopy with balloon dilation of right ureteral stricture and replacement of right double-J stent.  PREOPERATIVE DIAGNOSIS:  Right proximal ureteral stricture.  POSTOPERATIVE DIAGNOSIS:  Right proximal ureteral stricture.  SURGEON:  Marshall Cork. Jeffie Pollock, MD  ANESTHESIA:  General.  SPECIMEN:  None.  DRAINS:  An 8-French 24 cm right double-J stent.  COMPLICATIONS:  None.  INDICATIONS:  [name] is a 46 year old black female with a history of stones, who was admitted to [facility] last month with right flank pain. She was found to have a right proximal ureteral stricture.  She underwent stenting, it was quite tight and only a 4.7-French stent could be passed.  She returns now for ureteroscopy and further management of her stent.  FINDINGS/PROCEDURE:  She had been on Levaquin and Septra for possible UTI and was given Cipro and Rocephin preop.  She was placed on the operative table.  A general anesthetic was induced.  She was then fitted with PAS hose and placed in lithotomy position.  Her perineum and genitalia were prepped with Betadine solution.  She was draped in usual sterile fashion.  Cystoscopy was performed using a 22-French scope and 12-degree lens. Examination revealed a normal urethra.  The bladder wall had some erythema from the stent, but no tumors or stones were identified.  The left ureteral orifice was unremarkable.  The right ureteral orifice had some edema with a stent exiting from the orifice.  The stent was grasped with a grasping forceps and pulled the urethral meatus.  A  guidewire was then passed to the kidney through the stent and the old stent was removed.  A 6.4-French short ureteroscope was then passed alongside the wire.  I was then able to identify the area of stricture in the proximal ureter, just above the pelvic brim.  The scope now would passed easily through this to the level of the renal pelvis.  No other narrowing or other abnormalities were noted.  At this point, some contrast was instilled to get a better idea of the location of the stricture and she was noted to have some chronic dilation of the intrarenal collecting system.  At this point, the ureteroscope was removed and a 10 cm 15-French balloon was passed over the wire, across the strictured area.  The balloon was dilated to 16 atmospheres and maintained at that for 2 minutes.  Fluoroscopy revealed no residual wasting.  The balloon was then deflated and removed.  The cystoscope was reinserted over the wire and an 8-French 24 cm double- J stent was passed without difficulty to the kidney under fluoroscopic guidance.  The wire was removed leaving good coil in the kidney, a good coil in the bladder.  This will be left indwelling for 2 weeks and removed in the office.  At this point, the patient was taken down from lithotomy position.  Her anesthetic was reversed.  She was moved to recovery in stable condition. There were no complications.     Marshall Cork. Jeffie Pollock, M.D.     JJW/MEDQ  D:  09/24/2011  T:  09/25/2011  Job:  [id number]

## 2011-09-25 NOTE — Progress Notes (Signed)
1 Day Post-Op Subjective: Patient feels well except for mild nausea.  She states she has had persistent nausea since her hernia repair this past April.  She was able to eat a small breakfast and lunch.    Objective: Vital signs in last 24 hours: Temp:  [97.7 F (36.5 C)-98.4 F (36.9 C)] 98.4 F (36.9 C) (09/06 0457) Pulse Rate:  [60-73] 71  (09/06 0457) Resp:  [15-16] 16  (09/06 0457) BP: (102-136)/(70-87) 102/70 mmHg (09/06 0457) SpO2:  [97 %-100 %] 98 % (09/06 0924) Weight:  [103.3 kg (227 lb 11.8 oz)] 103.3 kg (227 lb 11.8 oz) (09/05 1359)  Intake/Output from previous day: 09/05 0701 - 09/06 0700 In: 2841.7 [P.O.:720; I.V.:2121.7] Out: 2300 [Urine:2300] Intake/Output this shift: Total I/O In: 480 [P.O.:480] Out: -   Physical Exam:  General:alert, cooperative and no distress Cardiac: RRR Lungs: CTA GI:  Soft, NT, ND; +BS   Lab Results:  Basename 09/22/11 2025  HGB 12.1  HCT 37.3   BMET  Basename 09/22/11 2025  NA 139  K 3.6  CL 105  CO2 23  GLUCOSE 162*  BUN 16  CREATININE 0.90  CALCIUM 9.7   No results found for this basename: LABPT:3,INR:3 in the last 72 hours No results found for this basename: LABURIN:1 in the last 72 hours Results for orders placed during the hospital encounter of 09/23/11  SURGICAL PCR SCREEN     Status: Normal   Collection Time   09/23/11 11:14 AM      Component Value Range Status Comment   MRSA, PCR NEGATIVE  NEGATIVE Final    Staphylococcus aureus NEGATIVE  NEGATIVE Final     Studies/Results: No results found.  Assessment/Plan: 1 Day Post-Op Procedure(s) (LRB): CYSTOSCOPY WITH URETEROSCOPY (Right) CYSTOSCOPY WITH RETROGRADE PYELOGRAM (Right) CYSTOSCOPY WITH STENT PLACEMENT (Right)  Doing well  D/c home   Advised pt to f/u with PCP and gen surg regarding persistent nausea.   LOS: 1 day   YARBROUGH,Kaileena Obi G. 09/25/2011, 1:43 PM

## 2011-10-12 ENCOUNTER — Emergency Department (HOSPITAL_COMMUNITY): Payer: PRIVATE HEALTH INSURANCE

## 2011-10-12 ENCOUNTER — Encounter (HOSPITAL_COMMUNITY): Payer: Self-pay

## 2011-10-12 ENCOUNTER — Emergency Department (HOSPITAL_COMMUNITY)
Admission: EM | Admit: 2011-10-12 | Discharge: 2011-10-12 | Disposition: A | Discharge status: Home / Self Care | Payer: PRIVATE HEALTH INSURANCE | Attending: Emergency Medicine | Admitting: Emergency Medicine

## 2011-10-12 DIAGNOSIS — E1142 Type 2 diabetes mellitus with diabetic polyneuropathy: Secondary | ICD-10-CM | POA: Insufficient documentation

## 2011-10-12 DIAGNOSIS — J449 Chronic obstructive pulmonary disease, unspecified: Secondary | ICD-10-CM | POA: Insufficient documentation

## 2011-10-12 DIAGNOSIS — J4489 Other specified chronic obstructive pulmonary disease: Secondary | ICD-10-CM | POA: Insufficient documentation

## 2011-10-12 DIAGNOSIS — E1149 Type 2 diabetes mellitus with other diabetic neurological complication: Secondary | ICD-10-CM | POA: Insufficient documentation

## 2011-10-12 DIAGNOSIS — Z9889 Other specified postprocedural states: Secondary | ICD-10-CM | POA: Insufficient documentation

## 2011-10-12 DIAGNOSIS — G40909 Epilepsy, unspecified, not intractable, without status epilepticus: Secondary | ICD-10-CM | POA: Insufficient documentation

## 2011-10-12 DIAGNOSIS — I252 Old myocardial infarction: Secondary | ICD-10-CM | POA: Insufficient documentation

## 2011-10-12 DIAGNOSIS — M549 Dorsalgia, unspecified: Secondary | ICD-10-CM | POA: Insufficient documentation

## 2011-10-12 DIAGNOSIS — R109 Unspecified abdominal pain: Secondary | ICD-10-CM | POA: Insufficient documentation

## 2011-10-12 DIAGNOSIS — Z794 Long term (current) use of insulin: Secondary | ICD-10-CM | POA: Insufficient documentation

## 2011-10-12 DIAGNOSIS — Z87891 Personal history of nicotine dependence: Secondary | ICD-10-CM | POA: Insufficient documentation

## 2011-10-12 DIAGNOSIS — Z79899 Other long term (current) drug therapy: Secondary | ICD-10-CM | POA: Insufficient documentation

## 2011-10-12 DIAGNOSIS — I10 Essential (primary) hypertension: Secondary | ICD-10-CM | POA: Insufficient documentation

## 2011-10-12 DIAGNOSIS — G4730 Sleep apnea, unspecified: Secondary | ICD-10-CM | POA: Insufficient documentation

## 2011-10-12 DIAGNOSIS — R110 Nausea: Secondary | ICD-10-CM | POA: Insufficient documentation

## 2011-10-12 DIAGNOSIS — Z87442 Personal history of urinary calculi: Secondary | ICD-10-CM | POA: Insufficient documentation

## 2011-10-12 LAB — CBC WITH DIFFERENTIAL/PLATELET
Basophils Absolute: 0 10*3/uL (ref 0.0–0.1)
Basophils Relative: 0 % (ref 0–1)
Eosinophils Absolute: 0 10*3/uL (ref 0.0–0.7)
Eosinophils Relative: 0 % (ref 0–5)
HCT: 35.2 % — ABNORMAL LOW (ref 36.0–46.0)
Hemoglobin: 11.5 g/dL — ABNORMAL LOW (ref 12.0–15.0)
Lymphocytes Relative: 30 % (ref 12–46)
Lymphs Abs: 2.5 10*3/uL (ref 0.7–4.0)
MCH: 29.1 pg (ref 26.0–34.0)
MCHC: 32.7 g/dL (ref 30.0–36.0)
MCV: 89.1 fL (ref 78.0–100.0)
Monocytes Absolute: 0.7 10*3/uL (ref 0.1–1.0)
Monocytes Relative: 8 % (ref 3–12)
Neutro Abs: 5.1 10*3/uL (ref 1.7–7.7)
Neutrophils Relative %: 61 % (ref 43–77)
Platelets: 299 10*3/uL (ref 150–400)
RBC: 3.95 MIL/uL (ref 3.87–5.11)
RDW: 14.6 % (ref 11.5–15.5)
WBC: 8.4 10*3/uL (ref 4.0–10.5)

## 2011-10-12 LAB — URINE MICROSCOPIC-ADD ON

## 2011-10-12 LAB — URINALYSIS, ROUTINE W REFLEX MICROSCOPIC
Bilirubin Urine: NEGATIVE
Glucose, UA: NEGATIVE mg/dL
Hgb urine dipstick: NEGATIVE
Ketones, ur: NEGATIVE mg/dL
Nitrite: NEGATIVE
Protein, ur: NEGATIVE mg/dL
Specific Gravity, Urine: 1.02 (ref 1.005–1.030)
Urobilinogen, UA: 0.2 mg/dL (ref 0.0–1.0)
pH: 7 (ref 5.0–8.0)

## 2011-10-12 MED ORDER — ONDANSETRON HCL 4 MG/2ML IJ SOLN
INTRAMUSCULAR | Status: AC
  Administered 2011-10-12: 4 mg
  Filled 2011-10-12: qty 2

## 2011-10-12 MED ORDER — HYDROMORPHONE HCL 2 MG PO TABS: 2.0000 mg | ORAL_TABLET | Freq: Four times a day (QID) | ORAL | Status: DC | PRN

## 2011-10-12 MED ORDER — SODIUM CHLORIDE 0.9 % IV BOLUS (SEPSIS)
1000.0000 mL | Freq: Once | INTRAVENOUS | Status: AC
  Administered 2011-10-12: 1000 mL via INTRAVENOUS

## 2011-10-12 MED ORDER — PROMETHAZINE HCL 25 MG PO TABS: 25.0000 mg | ORAL_TABLET | Freq: Four times a day (QID) | ORAL | Status: DC | PRN

## 2011-10-12 MED ORDER — HYDROMORPHONE HCL PF 1 MG/ML IJ SOLN
1.0000 mg | Freq: Once | INTRAMUSCULAR | Status: AC
  Administered 2011-10-12: 1 mg via INTRAVENOUS
  Filled 2011-10-12: qty 1

## 2011-10-12 NOTE — ED Notes (Signed)
Pt sts she is having back and foot pain, along with nausea. Sts she vomited yesterday but has only been nauseated today. Pt sts she is having foot pain, numbness and tingling- pain 10/10. Sts she is also having lower back pain. Pt had urethral stent removed 4 days ago- sts the pain she is feeling is similar to the pain she experienced before placement of the stent.

## 2011-10-12 NOTE — ED Notes (Signed)
IV attempted, unsuccessful. IV RN called.

## 2011-10-12 NOTE — ED Notes (Signed)
Pt had ureteral stent removed by Dr. Jeffie Pollock on 9/20. C/O back pain, right foot pain , bilateral feet are cold.

## 2011-10-12 NOTE — ED Notes (Signed)
Patient given discharge instructions, information, prescriptions, and diet order. Patient states that they adequately understand discharge information given and to return to ED if symptoms return or worsen.

## 2011-10-12 NOTE — ED Notes (Signed)
Pt and husband have ride home. Pt pain relieved to 7/10

## 2011-10-12 NOTE — ED Notes (Signed)
Pt c/o bilateral leg pain onset Thursday and back pain for a while. 10/10, throbbing pain, barely able to walk.

## 2011-10-13 NOTE — ED Provider Notes (Signed)
History     CSN: [id number]  Arrival date & time 10/12/11  1509   First MD Initiated Contact with Patient 10/12/11 1934      Chief Complaint  Patient presents with  . Back Pain  . Leg Pain    HPI The patient presents with concerns of ongoing back, abdominal, leg pain.  She has multiple medical problems, and most notably a recent stent placed and removed for ureteral stricture.  She notes that since the procedure she is persistent pain diffusely, sore, and her back, abdomen.  She also complains of ongoing leg pain.  She notes the leg pain predates the urologic procedure. She denies any new vomiting, diarrhea, dysuria, hematuria.  She does note continued nausea, anorexia. Symptoms are better with on narcotics, antiemetics.  No clear exacerbating factors. No fevers, no chills, no dyspnea, no chest pain. Past Medical History  Diagnosis Date  . Hypertension   . Asthma   . Seizures   . Heart murmur     born with   . Dysrhythmia     skipped beats  . Myocardial infarction     hi of x 2  last one 2003   . COPD (chronic obstructive pulmonary disease)     chronic bronchitis   . Pneumonia     hx of 2009  . GERD (gastroesophageal reflux disease)   . Headache     migraines   . Neuropathy     due to diabetes   . Dizziness     secondary to diabetes and hypertension   . Arthritis     joint pain   . Hx of blood clots     hematomas removed from left side of brain from 4mo to 89yrold   . Epilepsy idiopathic petit mal     last seizure 2012;controlled w/ topomax  . Diabetic neuropathy, painful     FEET AND HANDS  . Diabetes mellitus     since age [age]type 2 IDDM  . Sleep apnea     sleep study 2010 @ [facility];does not use Cpap ; mild  . MIGRAINE HEADACHE 09/14/2006    Qualifier: Diagnosis of  By: MaHassell DoneNP, NyTori Milks  . Glaucoma     NOT ON ANY EYE DROPS   . Legally blind     left eye   . Stroke     last 2003  RESIDUAL LEFT LEG WEAKNESS--NO OTHER RESIDUAL PROBLEMS  . CVA  11/14/2007    Qualifier: Diagnosis of  By: MaHassell DoneNP, NyTori Milks  . Pain 09/23/11    LOWER BACK AND RIGHT SIDE--PT HAS RIGHT URETERAL STONE  . Nephrolithiasis     frequent urination , urination at nite  PT SEEN IN ER 09/22/11 FOR BACK AND RT SIDED PAIN--HAS KNOWN STONE RT URETER AND UA IN ER SHOWED UTI    Past Surgical History  Procedure Date  . Kidney stone removal   . Brain hematoma evacutation     five procedures total, first procedure when [age]onths old, last at [age]ears of age.  . Shunt removal     shunt inserted at age [age]emoved at age 89 . Eye surgery   . Retinal detachment surgery 1990  . Vaginal hysterectomy 1996  . Incisional hernia repair 05/05/2011    Procedure: LAPAROSCOPIC INCISIONAL HERNIA;  Surgeon: MaRolm BookbinderMD;  [facility] Service: General;  Laterality: N/A;  . Mass excision 05/05/2011    Procedure: EXCISION MASS;  Surgeon: Rolm Bookbinder, MD;  Location: [place];  Service: General;  Laterality: Right;  . Hernia repair 05/05/11    lap incisional hernia  . Pcnl   . Cystoscopy/retrograde/ureteroscopy 08/24/2011    Procedure: CYSTOSCOPY/RETROGRADE/URETEROSCOPY;  Surgeon: Molli Hazard, MD;  Location: [facility];  Service: Urology;  Laterality: Right;  Cysto, Right retrograde Pyelogram, right stent placement.   . Cystoscopy with ureteroscopy 09/24/2011    Procedure: CYSTOSCOPY WITH URETEROSCOPY;  Surgeon: Malka So, MD;  Location: [facility];  Service: Urology;  Laterality: Right;  Balloon dilation right ureter   . Cystoscopy w/ retrogrades 09/24/2011    Procedure: CYSTOSCOPY WITH RETROGRADE PYELOGRAM;  Surgeon: Malka So, MD;  Location: [facility];  Service: Urology;  Laterality: Right;    Family History  Problem Relation Age of Onset  . Cancer Mother     uterine  . Cancer Maternal Grandmother     brain  . Anesthesia problems Neg Hx     History  Substance Use Topics  . Smoking status: Former Smoker -- 1.0 packs/day for 30 years    Types: Cigarettes      Quit date: 01/20/1987  . Smokeless tobacco: Never Used  . Alcohol Use: No    OB History    Grav Para Term Preterm Abortions TAB SAB Ect Mult Living                  Review of Systems  Constitutional:       HPI  HENT:       HPI otherwise negative  Eyes: Negative.   Respiratory:       HPI, otherwise negative  Cardiovascular:       HPI, otherwise nmegative  Gastrointestinal: Negative for vomiting.  Genitourinary:       HPI, otherwise negative  Musculoskeletal:       HPI, otherwise negative  Skin: Negative.   Neurological: Negative for syncope.    Allergies  Codeine and Lactose intolerance (gi)  Home Medications   Current Outpatient Rx  Name Route Sig Dispense Refill  . ALBUTEROL SULFATE HFA 108 (90 BASE) MCG/ACT IN AERS Inhalation Inhale 2 puffs into the lungs every 6 (six) hours as needed. Wheezing    . BACLOFEN 10 MG PO TABS Oral Take 10 mg by mouth 3 (three) times daily.    . BUDESONIDE-FORMOTEROL FUMARATE 160-4.5 MCG/ACT IN AERO Inhalation Inhale 2 puffs into the lungs 2 (two) times daily.    [name] VITAMIN D PO Oral Take 1 tablet by mouth daily.    [name] CLOPIDOGREL BISULFATE 75 MG PO TABS Oral Take 75 mg by mouth daily.    [name] DICLOFENAC SODIUM 1 % TD GEL Topical Apply 2 g topically 4 (four) times daily.    [name] GABAPENTIN 300 MG PO CAPS Oral Take 600 mg by mouth 3 (three) times daily.     [name] GLIPIZIDE ER 10 MG PO TB24 Oral Take 20 mg by mouth every morning.     . INSULIN ASPART 100 UNIT/ML [place] SOLN Subcutaneous Inject 2-14 Units into the skin 4 (four) times daily. Sliding scale    . INSULIN DETEMIR 100 UNIT/ML [place] SOLN Subcutaneous Inject 30-40 Units into the skin 2 (two) times daily. 40 units every morning and 30 units every night.    [name] LEVOFLOXACIN 250 MG PO TABS Oral Take 100 mg by mouth daily. PT STATES HER PHARMACY SENT 100MG CAPSULES--NOT 250MG TAB    . LOSARTAN POTASSIUM-HCTZ 100-12.5 MG PO TABS Oral Take 1 tablet by  mouth daily.    [name] OMEPRAZOLE 20 MG PO CPDR Oral Take  20 mg by mouth daily.    [name] ONDANSETRON HCL 8 MG PO TABS Oral Take 8 mg by mouth every 6 (six) hours. PT STATES Nausea FROM PREVIOUS HERNIA REPAIR AND FROM KIDNEY STONE ---TAKES ZOFRAN  AT 8AM AND 12 NOON AND 8 PM  EVERY DAY    . OXYBUTYNIN CHLORIDE ER 10 MG PO TB24 Oral Take 10 mg by mouth daily.    [name] POLYETHYLENE GLYCOL 3350 PO PACK Oral Take 17 g by mouth daily as needed. Constipation.    [name] POTASSIUM GLUCONATE 550 MG PO TABS Oral Take 1 tablet by mouth daily.    [name] RANITIDINE HCL 150 MG PO TABS Oral Take 150 mg by mouth 2 (two) times daily.    [name] SIMVASTATIN 20 MG PO TABS Oral Take 20 mg by mouth daily. TAKES IN AM    . SULFAMETHOXAZOLE-TMP DS 800-160 MG PO TABS Oral Take 1 tablet by mouth 2 (two) times daily.    [name] TAMSULOSIN HCL 0.4 MG PO CAPS Oral Take 0.4 mg by mouth daily after breakfast.    . TOPIRAMATE 200 MG PO TABS Oral Take 200 mg by mouth 2 (two) times daily.    . TRAZODONE HCL 150 MG PO TABS Oral Take 150 mg by mouth at bedtime.    [name] HYDROMORPHONE HCL 2 MG PO TABS Oral Take 1 tablet (2 mg total) by mouth every 6 (six) hours as needed. For pain 12 tablet 0  . PROMETHAZINE HCL 25 MG PO TABS Oral Take 1 tablet (25 mg total) by mouth every 6 (six) hours as needed for nausea. 20 tablet 0    BP 107/68  Pulse 69  Temp 98.3 F (36.8 C) (Oral)  Resp 16  SpO2 99%  Physical Exam  Nursing note and vitals reviewed. Constitutional: She is oriented to person, place, and time. She appears well-developed and well-nourished. No distress.  HENT:  Head: Normocephalic and atraumatic.  Eyes: Conjunctivae normal and EOM are normal.  Cardiovascular: Normal rate and regular rhythm.   Pulmonary/Chest: Effort normal and breath sounds normal. No stridor. No respiratory distress.  Abdominal: Soft. She exhibits no distension. There is no tenderness.  Musculoskeletal: She exhibits no edema.  Neurological: She is alert and oriented to person, place, and time. No cranial nerve deficit.  Skin: Skin is warm  and dry.  Psychiatric: She has a normal mood and affect.    ED Course  Procedures (including critical care time)  Labs Reviewed  URINALYSIS, ROUTINE W REFLEX MICROSCOPIC - Abnormal; Notable for the following:    APPearance CLOUDY (*)     Leukocytes, UA SMALL (*)     All other components within normal limits  URINE MICROSCOPIC-ADD ON - Abnormal; Notable for the following:    Squamous Epithelial / LPF FEW (*)     Bacteria, UA FEW (*)     All other components within normal limits  CBC WITH DIFFERENTIAL - Abnormal; Notable for the following:    Hemoglobin 11.5 (*)     HCT 35.2 (*)     All other components within normal limits  URINE CULTURE   Dg Abd 1 View  10/12/2011  *RADIOLOGY REPORT*  Clinical Data: 46 year old female with back pain nausea abdominal pain.  ABDOMEN - 1 VIEW  Comparison: CT abdomen pelvis 08/23/2011 and earlier.  Findings: Supine views of the abdomen.  Stable and nonobstructed bowel gas pattern.  Lung bases appear clear.  No definite pneumoperitoneum on the supine views. Stable visualized osseous structures.  IMPRESSION: Nonobstructed bowel gas pattern.   Original Report Authenticated By: Randall An, M.D.      1. Abdominal pain       MDM  This patient with multiple medical problems now presents with ongoing pain following a recent urologic procedure.  On exam she is in no distress.  The patient's abdomen is soft.  Given her recent procedures there suspicion of post procedural pain.  The patient is currently taking antibiotics.  The labs and studies were largely reassuring, the patient was comfortable appearing on repeat exam.  We discussed the need for close outpatient followup, including discussion with urology tomorrow.  She was discharged in stable condition with additional analgesics, antiemetics and instructions to continue her antibiotics.  Absent acute findings, and with close follow up possible to she was appropriate for discharge.    Carmin Muskrat,  MD 10/13/11 [phone]

## 2011-10-14 LAB — URINE CULTURE
Colony Count: NO GROWTH
Culture: NO GROWTH

## 2012-09-26 ENCOUNTER — Ambulatory Visit: Payer: PRIVATE HEALTH INSURANCE | Attending: Family Medicine | Admitting: Internal Medicine

## 2012-09-26 ENCOUNTER — Encounter: Payer: Self-pay | Admitting: Internal Medicine

## 2012-09-26 VITALS — BP 139/94 | HR 77 | Temp 99.4°F | Resp 16 | Ht 66.0 in | Wt 243.0 lb

## 2012-09-26 DIAGNOSIS — E119 Type 2 diabetes mellitus without complications: Secondary | ICD-10-CM

## 2012-09-26 DIAGNOSIS — F3289 Other specified depressive episodes: Secondary | ICD-10-CM

## 2012-09-26 DIAGNOSIS — F329 Major depressive disorder, single episode, unspecified: Secondary | ICD-10-CM

## 2012-09-26 DIAGNOSIS — K219 Gastro-esophageal reflux disease without esophagitis: Secondary | ICD-10-CM

## 2012-09-26 DIAGNOSIS — M549 Dorsalgia, unspecified: Secondary | ICD-10-CM | POA: Insufficient documentation

## 2012-09-26 DIAGNOSIS — G8929 Other chronic pain: Secondary | ICD-10-CM | POA: Insufficient documentation

## 2012-09-26 DIAGNOSIS — E785 Hyperlipidemia, unspecified: Secondary | ICD-10-CM

## 2012-09-26 MED ORDER — OMEPRAZOLE 20 MG PO CPDR: 40.0000 mg | DELAYED_RELEASE_CAPSULE | Freq: Every day | ORAL | Status: DC

## 2012-09-26 NOTE — Progress Notes (Signed)
Patient Demographics  [name], is a 47 y.o. female  WNI:[id number]  FGH:[id number]  DOB - 04-18-1965  Chief Complaint  Patient presents with  . Establish Care        Subjective:   [name] today is here to establish primary care. Patient has a very complicated past medical history, is on numerous medications. She has a past medical history of sick multiple CVAs, claims to have had MI in the past as well, diabetes, hypertension, chronic back pain on chronic narcotic therapy. She does not have a list of her current medications, she used to be a patient of Dr. Jeanie Cooks, and now wants to transition her care to this clinic. Upon discussion with the patient, she is on numerous other medications the names of which she cannot remember, the names of which she remembers she does not know the exact dosage. She claims that we have an updated list of her medications, however upon reviewing some of these medications that we have in a system, it looks like she has been discontinued from a lot of these meds. She currently does not have any acute complaints. Patient has also has No headache, No chest pain, No abdominal pain,No Nausea, No new weakness tingling or numbness, No Cough or SOB.   Objective:    Filed Vitals:   09/26/12 1629  BP: 139/94  Pulse: 77  Temp: 99.4 F (37.4 C)  TempSrc: Oral  Resp: 16  Height: 5' 6"  (1.676 m)  Weight: 243 lb (110.224 kg)  SpO2: 97%     ALLERGIES:   Allergies  Allergen Reactions  . Codeine Swelling and Other (See Comments)    Swelling and burning of mouth (inside)  . Lactose Intolerance (Gi)     PAST MEDICAL HISTORY: Past Medical History  Diagnosis Date  . Hypertension   . Asthma   . Seizures   . Heart murmur     born with   . Dysrhythmia     skipped beats  . Myocardial infarction     hi of x 2  last one 2003   . COPD (chronic obstructive pulmonary disease)     chronic bronchitis   . Pneumonia     hx of 2009  . GERD  (gastroesophageal reflux disease)   . Headache(784.0)     migraines   . Neuropathy     due to diabetes   . Dizziness     secondary to diabetes and hypertension   . Arthritis     joint pain   . Hx of blood clots     hematomas removed from left side of brain from 13mo to 89yrold   . Epilepsy idiopathic petit mal     last seizure 2012;controlled w/ topomax  . Diabetic neuropathy, painful     FEET AND HANDS  . Diabetes mellitus     since age [age]type 2 IDDM  . Sleep apnea     sleep study 2010 @ [facility];does not use Cpap ; mild  . MIGRAINE HEADACHE 09/14/2006    Qualifier: Diagnosis of  By: MaHassell DoneNP, NyTori Milks  . Glaucoma     NOT ON ANY EYE DROPS   . Legally blind     left eye   . Stroke     last 2003  RESIDUAL LEFT LEG WEAKNESS--NO OTHER RESIDUAL PROBLEMS  . CVA 11/14/2007    Qualifier: Diagnosis of  By: MaHassell DoneNP, NyTori Milks  . Pain 09/23/11    LOWER BACK AND  RIGHT SIDE--PT HAS RIGHT URETERAL STONE  . Nephrolithiasis     frequent urination , urination at nite  PT SEEN IN ER 09/22/11 FOR BACK AND RT SIDED PAIN--HAS KNOWN STONE RT URETER AND UA IN ER SHOWED UTI    PAST SURGICAL HISTORY: Past Surgical History  Procedure Laterality Date  . Kidney stone removal    . Brain hematoma evacutation      five procedures total, first procedure when 33 months old, last at 47 years of age.  . Shunt removal      shunt inserted at age 82 removed at age 89   . Eye surgery    . Retinal detachment surgery  1990  . Vaginal hysterectomy  1996  . Incisional hernia repair  05/05/2011    Procedure: LAPAROSCOPIC INCISIONAL HERNIA;  Surgeon: Rolm Bookbinder, MD;  Location: [place];  Service: General;  Laterality: N/A;  . Mass excision  05/05/2011    Procedure: EXCISION MASS;  Surgeon: Rolm Bookbinder, MD;  Location: [place];  Service: General;  Laterality: Right;  . Hernia repair  05/05/11    lap incisional hernia  . Pcnl    . Cystoscopy/retrograde/ureteroscopy  08/24/2011    Procedure:  CYSTOSCOPY/RETROGRADE/URETEROSCOPY;  Surgeon: Molli Hazard, MD;  Location: [facility];  Service: Urology;  Laterality: Right;  Cysto, Right retrograde Pyelogram, right stent placement.   . Cystoscopy with ureteroscopy  09/24/2011    Procedure: CYSTOSCOPY WITH URETEROSCOPY;  Surgeon: Malka So, MD;  Location: [facility];  Service: Urology;  Laterality: Right;  Balloon dilation right ureter   . Cystoscopy w/ retrogrades  09/24/2011    Procedure: CYSTOSCOPY WITH RETROGRADE PYELOGRAM;  Surgeon: Malka So, MD;  Location: [facility];  Service: Urology;  Laterality: Right;    FAMILY HISTORY: Family History  Problem Relation Age of Onset  . Cancer Mother     uterine  . Cancer Maternal Grandmother     brain  . Anesthesia problems Neg Hx     MEDICATIONS AT HOME: Prior to Admission medications   Medication Sig Start Date End Date Taking? Authorizing Provider  albuterol (PROVENTIL HFA;VENTOLIN HFA) 108 (90 BASE) MCG/ACT inhaler Inhale 2 puffs into the lungs every 6 (six) hours as needed. Wheezing   Yes Historical Provider, MD  baclofen (LIORESAL) 10 MG tablet Take 10 mg by mouth 3 (three) times daily.   Yes Historical Provider, MD  budesonide-formoterol (SYMBICORT) 160-4.5 MCG/ACT inhaler Inhale 2 puffs into the lungs 2 (two) times daily.   Yes Historical Provider, MD  Cholecalciferol (VITAMIN D PO) Take 1 tablet by mouth daily.   Yes Historical Provider, MD  clopidogrel (PLAVIX) 75 MG tablet Take 75 mg by mouth daily.   Yes Historical Provider, MD  diclofenac sodium (VOLTAREN) 1 % GEL Apply 2 g topically 4 (four) times daily.   Yes Historical Provider, MD  gabapentin (NEURONTIN) 300 MG capsule Take 600 mg by mouth 3 (three) times daily.    Yes Historical Provider, MD  glipiZIDE (GLUCOTROL XL) 10 MG 24 hr tablet Take 20 mg by mouth every morning.    Yes Historical Provider, MD  insulin detemir (LEVEMIR) 100 UNIT/ML injection Inject 30-40 Units into the skin 2 (two) times daily. 40 units every morning  and 30 units every night.   Yes Historical Provider, MD  omeprazole (PRILOSEC) 20 MG capsule Take 2 capsules (40 mg total) by mouth daily. 09/26/12  Yes Rameen Quinney Kristeen Mans, MD  ondansetron (ZOFRAN) 8 MG tablet Take 8 mg by mouth every  6 (six) hours. PT STATES Nausea FROM PREVIOUS HERNIA REPAIR AND FROM KIDNEY STONE ---TAKES ZOFRAN  AT 8AM AND 12 NOON AND 8 PM  EVERY DAY   Yes Historical Provider, MD  polyethylene glycol (MIRALAX / GLYCOLAX) packet Take 17 g by mouth daily as needed. Constipation.   Yes Historical Provider, MD  promethazine (PHENERGAN) 25 MG tablet Take 1 tablet (25 mg total) by mouth every 6 (six) hours as needed for nausea. 10/12/11  Yes Carmin Muskrat, MD  simvastatin (ZOCOR) 20 MG tablet Take 20 mg by mouth daily. TAKES IN AM   Yes Historical Provider, MD  Tamsulosin HCl (FLOMAX) 0.4 MG CAPS Take 0.4 mg by mouth daily after breakfast.   Yes Historical Provider, MD  topiramate (TOPAMAX) 200 MG tablet Take 200 mg by mouth 2 (two) times daily.   Yes Historical Provider, MD  traZODone (DESYREL) 150 MG tablet Take 150 mg by mouth at bedtime.   Yes Historical Provider, MD    SOCIAL HISTORY:   reports that she quit smoking about 25 years ago. Her smoking use included Cigarettes. She has a 30 pack-year smoking history. She has never used smokeless tobacco. She reports that she does not drink alcohol or use illicit drugs.  REVIEW OF SYSTEMS:  Constitutional:   No   Fevers, chills, fatigue.  HEENT:    No headaches, Sore throat,   Cardio-vascular: No chest pain,  Orthopnea, swelling in lower extremities, anasarca, palpitations  GI:  No abdominal pain, nausea, vomiting, diarrhea  Resp: No shortness of breath,  No coughing up of blood.No cough.No wheezing.  Skin:  no rash or lesions.  GU:  no dysuria, change in color of urine, no urgency or frequency.  No flank pain.  Musculoskeletal: No joint pain or swelling.  No decreased range of motion.    Psych: No change in mood or  affect. No depression or anxiety.  No memory loss.   Exam  General appearance :Awake, alert, not in any distress. Speech Clear. Not toxic Looking HEENT: Atraumatic and Normocephalic, pupils equally reactive to light and accomodation Neck: supple, no JVD. No cervical lymphadenopathy.  Chest:Good air entry bilaterally, no added sounds  CVS: S1 S2 regular, no murmurs.  Abdomen: Bowel sounds present, Non tender and not distended with no gaurding, rigidity or rebound. Extremities: B/L Lower Ext shows no edema, both legs are warm to touch Neurology: Awake alert, and oriented X 3, CN II-XII intact, Non focal Skin:No Rash Wounds:N/A    Data Review   CBC No results found for this basename: WBC, HGB, HCT, PLT, MCV, MCH, MCHC, RDW, NEUTRABS, LYMPHSABS, MONOABS, EOSABS, BASOSABS, BANDABS, BANDSABD,  in the last 168 hours  Chemistries   No results found for this basename: NA, K, CL, CO2, GLUCOSE, BUN, CREATININE, GFRCGP, CALCIUM, MG, AST, ALT, ALKPHOS, BILITOT,  in the last 168 hours ------------------------------------------------------------------------------------------------------------------ No results found for this basename: HGBA1C,  in the last 72 hours ------------------------------------------------------------------------------------------------------------------ No results found for this basename: CHOL, HDL, LDLCALC, TRIG, CHOLHDL, LDLDIRECT,  in the last 72 hours ------------------------------------------------------------------------------------------------------------------ No results found for this basename: TSH, T4TOTAL, FREET3, T3FREE, THYROIDAB,  in the last 72 hours ------------------------------------------------------------------------------------------------------------------ No results found for this basename: VITAMINB12, FOLATE, FERRITIN, TIBC, IRON, RETICCTPCT,  in the last 72 hours  Coagulation profile  No results found for this basename: INR, PROTIME,  in the last  168 hours    Assessment & Plan   Unfortunately, patient on multiple medications, wants to transition her care here. She does not have  any updated list of her medications, she is not clear about the exact dosing of some of her medications. She also appears to be on chronic narcotics and numerous other pills. I have asked her to bring all her medications-including bottles, and all list of all medications in the next few days before we can safely transition her care here and start prescribing her medications. I especially worried about polypharmacy and drug interactions. I have also explained to her, that we do not do chronic pain management here and she will be referred to pain management.  I have asked the RN to make her an appointment in the next few days.  Patient understood the rationale, and was agreeable to be seen again in the next few days when she gets all her medications with her- next visit  Follow up in 2-3 days  The patient was given clear instructions to go to ER or return to medical center if symptoms don't improve, worsen or new problems develop. The patient verbalized understanding. The patient was told to call to get lab results if they haven't heard anything in the next week.

## 2012-09-26 NOTE — Progress Notes (Signed)
Pt is here to establish care. Pt has a history of diabetes and hypertension. Pt C.C. She has lower back pain for three days. Pain scale is an 8, sharp, radiates to her lower extremities.

## 2012-10-07 ENCOUNTER — Encounter: Payer: Self-pay | Admitting: Internal Medicine

## 2012-10-07 ENCOUNTER — Ambulatory Visit: Payer: PRIVATE HEALTH INSURANCE | Attending: Internal Medicine | Admitting: Internal Medicine

## 2012-10-07 DIAGNOSIS — I10 Essential (primary) hypertension: Secondary | ICD-10-CM | POA: Insufficient documentation

## 2012-10-07 DIAGNOSIS — I252 Old myocardial infarction: Secondary | ICD-10-CM

## 2012-10-07 DIAGNOSIS — E785 Hyperlipidemia, unspecified: Secondary | ICD-10-CM

## 2012-10-07 DIAGNOSIS — Z23 Encounter for immunization: Secondary | ICD-10-CM

## 2012-10-07 DIAGNOSIS — R569 Unspecified convulsions: Secondary | ICD-10-CM

## 2012-10-07 DIAGNOSIS — K219 Gastro-esophageal reflux disease without esophagitis: Secondary | ICD-10-CM

## 2012-10-07 DIAGNOSIS — F329 Major depressive disorder, single episode, unspecified: Secondary | ICD-10-CM

## 2012-10-07 DIAGNOSIS — E119 Type 2 diabetes mellitus without complications: Secondary | ICD-10-CM | POA: Insufficient documentation

## 2012-10-07 LAB — CBC WITH DIFFERENTIAL/PLATELET
Basophils Absolute: 0 10*3/uL (ref 0.0–0.1)
Basophils Relative: 0 % (ref 0–1)
Eosinophils Absolute: 0 10*3/uL (ref 0.0–0.7)
Eosinophils Relative: 0 % (ref 0–5)
HCT: 38.2 % (ref 36.0–46.0)
Hemoglobin: 12.6 g/dL (ref 12.0–15.0)
Lymphocytes Relative: 27 % (ref 12–46)
Lymphs Abs: 1.9 10*3/uL (ref 0.7–4.0)
MCH: 29.8 pg (ref 26.0–34.0)
MCHC: 33 g/dL (ref 30.0–36.0)
MCV: 90.3 fL (ref 78.0–100.0)
Monocytes Absolute: 0.7 10*3/uL (ref 0.1–1.0)
Monocytes Relative: 10 % (ref 3–12)
Neutro Abs: 4.4 10*3/uL (ref 1.7–7.7)
Neutrophils Relative %: 63 % (ref 43–77)
Platelets: 306 10*3/uL (ref 150–400)
RBC: 4.23 MIL/uL (ref 3.87–5.11)
RDW: 13.5 % (ref 11.5–15.5)
WBC: 7 10*3/uL (ref 4.0–10.5)

## 2012-10-07 LAB — LIPID PANEL
Cholesterol: 163 mg/dL (ref 0–200)
HDL: 39 mg/dL — ABNORMAL LOW (ref >39)
LDL Cholesterol: 105 mg/dL — ABNORMAL HIGH (ref 0–99)
Total CHOL/HDL Ratio: 4.2 Ratio
Triglycerides: 94 mg/dL (ref <150)
VLDL: 19 mg/dL (ref 0–40)

## 2012-10-07 LAB — BASIC METABOLIC PANEL
BUN: 14 mg/dL (ref 6–23)
CO2: 25 mEq/L (ref 19–32)
Calcium: 9.2 mg/dL (ref 8.4–10.5)
Chloride: 108 mEq/L (ref 96–112)
Creat: 0.89 mg/dL (ref 0.50–1.10)
Glucose, Bld: 89 mg/dL (ref 70–99)
Potassium: 3.7 mEq/L (ref 3.5–5.3)
Sodium: 139 mEq/L (ref 135–145)

## 2012-10-07 MED ORDER — VERAPAMIL HCL ER 240 MG PO CP24: 240.0000 mg | ORAL_CAPSULE | Freq: Every day | ORAL | Status: DC

## 2012-10-07 MED ORDER — VITAMIN D (ERGOCALCIFEROL) 1.25 MG (50000 UNIT) PO CAPS: 50000.0000 [IU] | ORAL_CAPSULE | ORAL | Status: DC

## 2012-10-07 MED ORDER — GABAPENTIN 300 MG PO CAPS: 600.0000 mg | ORAL_CAPSULE | Freq: Three times a day (TID) | ORAL | Status: DC

## 2012-10-07 MED ORDER — INSULIN DETEMIR 100 UNIT/ML ~~LOC~~ SOLN: 30.0000 [IU] | Freq: Two times a day (BID) | SUBCUTANEOUS | Status: DC

## 2012-10-07 MED ORDER — CLOPIDOGREL BISULFATE 75 MG PO TABS: 75.0000 mg | ORAL_TABLET | Freq: Every day | ORAL | Status: DC

## 2012-10-07 MED ORDER — BACLOFEN 10 MG PO TABS: 10.0000 mg | ORAL_TABLET | Freq: Three times a day (TID) | ORAL | Status: DC

## 2012-10-07 NOTE — Progress Notes (Signed)
Patient ID: [name], female   DOB: 1965/07/25, 47 y.o.   MRN: [id number] Patient Demographics  [name], is a 47 y.o. female  ZJI:[id number]  [id number]  DOB - 03-25-1965  No chief complaint on file.       Subjective:   [name] today is here to establish primary care.  Patient has No headache, No chest pain, No abdominal pain - No Nausea, No new weakness tingling or numbness, No Cough. Feels tired on walking from bus stop, once refills on her medications  Objective:    There were no vitals filed for this visit.   ALLERGIES:   Allergies  Allergen Reactions  . Codeine Swelling and Other (See Comments)    Swelling and burning of mouth (inside)  . Lactose Intolerance (Gi)     PAST MEDICAL HISTORY: Past Medical History  Diagnosis Date  . Hypertension   . Asthma   . Seizures   . Heart murmur     born with   . Dysrhythmia     skipped beats  . Myocardial infarction     hi of x 2  last one 2003   . COPD (chronic obstructive pulmonary disease)     chronic bronchitis   . Pneumonia     hx of 2009  . GERD (gastroesophageal reflux disease)   . Headache(784.0)     migraines   . Neuropathy     due to diabetes   . Dizziness     secondary to diabetes and hypertension   . Arthritis     joint pain   . Hx of blood clots     hematomas removed from left side of brain from 75mo to 89yrold   . Epilepsy idiopathic petit mal     last seizure 2012;controlled w/ topomax  . Diabetic neuropathy, painful     FEET AND HANDS  . Diabetes mellitus     since age [age]type 2 IDDM  . Sleep apnea     sleep study 2010 @ [facility];does not use Cpap ; mild  . MIGRAINE HEADACHE 09/14/2006    Qualifier: Diagnosis of  By: MaHassell DoneNP, [name]  . Glaucoma     NOT ON ANY EYE DROPS   . Legally blind     left eye   . Stroke     last 2003  RESIDUAL LEFT LEG WEAKNESS--NO OTHER RESIDUAL PROBLEMS  . CVA 11/14/2007    Qualifier: Diagnosis of  By: MaHassell DoneNP, [name]   . Pain 09/23/11    LOWER BACK AND RIGHT SIDE--PT HAS RIGHT URETERAL STONE  . Nephrolithiasis     frequent urination , urination at nite  PT SEEN IN ER 09/22/11 FOR BACK AND RT SIDED PAIN--HAS KNOWN STONE RT URETER AND UA IN ER SHOWED UTI    PAST SURGICAL HISTORY: Past Surgical History  Procedure Laterality Date  . Kidney stone removal    . Brain hematoma evacutation      five procedures total, first procedure when [age]onths old, last at [age]ears of age.  . Shunt removal      shunt inserted at age [age]emoved at age 47 . Eye surgery    . Retinal detachment surgery  1990  . Vaginal hysterectomy  1996  . Incisional hernia repair  05/05/2011    Procedure: LAPAROSCOPIC INCISIONAL HERNIA;  Surgeon: MaRolm BookbinderMD;  [facility] Service: General;  Laterality: N/A;  . Mass excision  05/05/2011  Procedure: EXCISION MASS;  Surgeon: Rolm Bookbinder, MD;  Location: [facility];  Service: General;  Laterality: Right;  . Hernia repair  05/05/11    lap incisional hernia  . Pcnl    . Cystoscopy/retrograde/ureteroscopy  08/24/2011    Procedure: CYSTOSCOPY/RETROGRADE/URETEROSCOPY;  Surgeon: Molli Hazard, MD;  Location: [facility];  Service: Urology;  Laterality: Right;  Cysto, Right retrograde Pyelogram, right stent placement.   . Cystoscopy with ureteroscopy  09/24/2011    Procedure: CYSTOSCOPY WITH URETEROSCOPY;  Surgeon: Malka So, MD;  Location: [facility];  Service: Urology;  Laterality: Right;  Balloon dilation right ureter   . Cystoscopy w/ retrogrades  09/24/2011    Procedure: CYSTOSCOPY WITH RETROGRADE PYELOGRAM;  Surgeon: Malka So, MD;  Location: [facility];  Service: Urology;  Laterality: Right;    FAMILY HISTORY: Family History  Problem Relation Age of Onset  . Cancer Mother     uterine  . Cancer Maternal Grandmother     brain  . Anesthesia problems Neg Hx     MEDICATIONS AT HOME: Prior to Admission medications   Medication Sig Start Date End Date Taking? Authorizing  Provider  verapamil (VERELAN PM) 240 MG 24 hr capsule Take 1 capsule (240 mg total) by mouth at bedtime. 10/07/12  Yes Shelton Soler Krystal Eaton, MD  albuterol (PROVENTIL HFA;VENTOLIN HFA) 108 (90 BASE) MCG/ACT inhaler Inhale 2 puffs into the lungs every 6 (six) hours as needed. Wheezing    Historical Provider, MD  baclofen (LIORESAL) 10 MG tablet Take 1 tablet (10 mg total) by mouth 3 (three) times daily. 10/07/12   Jayona Mccaig Krystal Eaton, MD  budesonide-formoterol (SYMBICORT) 160-4.5 MCG/ACT inhaler Inhale 2 puffs into the lungs 2 (two) times daily.    Historical Provider, MD  Cholecalciferol (VITAMIN D PO) Take 1 tablet by mouth daily.    Historical Provider, MD  clopidogrel (PLAVIX) 75 MG tablet Take 1 tablet (75 mg total) by mouth daily. 10/07/12   Blessin Kanno Krystal Eaton, MD  diclofenac sodium (VOLTAREN) 1 % GEL Apply 2 g topically 4 (four) times daily.    Historical Provider, MD  gabapentin (NEURONTIN) 300 MG capsule Take 2 capsules (600 mg total) by mouth 3 (three) times daily. 10/07/12   Maryjean Corpening Krystal Eaton, MD  glipiZIDE (GLUCOTROL XL) 10 MG 24 hr tablet Take 20 mg by mouth every morning.     Historical Provider, MD  insulin detemir (LEVEMIR) 100 UNIT/ML injection Inject 0.3-0.4 mLs (30-40 Units total) into the skin 2 (two) times daily. 40 units every morning and 30 units every night. 10/07/12   Noelle Hoogland Krystal Eaton, MD  omeprazole (PRILOSEC) 20 MG capsule Take 2 capsules (40 mg total) by mouth daily. 09/26/12   Shanker Kristeen Mans, MD  ondansetron (ZOFRAN) 8 MG tablet Take 8 mg by mouth every 6 (six) hours. PT STATES Nausea FROM PREVIOUS HERNIA REPAIR AND FROM KIDNEY STONE ---TAKES ZOFRAN  AT 8AM AND 12 NOON AND 8 PM  EVERY DAY    Historical Provider, MD  polyethylene glycol (MIRALAX / GLYCOLAX) packet Take 17 g by mouth daily as needed. Constipation.    Historical Provider, MD  promethazine (PHENERGAN) 25 MG tablet Take 1 tablet (25 mg total) by mouth every 6 (six) hours as needed for nausea. 10/12/11   Carmin Muskrat, MD  simvastatin  (ZOCOR) 20 MG tablet Take 20 mg by mouth daily. TAKES IN AM    Historical Provider, MD  Tamsulosin HCl (FLOMAX) 0.4 MG CAPS Take 0.4 mg by mouth daily after breakfast.  Historical Provider, MD  topiramate (TOPAMAX) 200 MG tablet Take 200 mg by mouth 2 (two) times daily.    Historical Provider, MD  traZODone (DESYREL) 150 MG tablet Take 150 mg by mouth at bedtime.    Historical Provider, MD  Vitamin D, Ergocalciferol, (DRISDOL) 50000 UNITS CAPS capsule Take 1 capsule (50,000 Units total) by mouth every 7 (seven) days. 10/07/12   Yadiel Aubry Krystal Eaton, MD    REVIEW OF SYSTEMS:  Constitutional:   No   Fevers, chills, fatigue.  HEENT:    No headaches, Sore throat,   Cardio-vascular: No chest pain,  Orthopnea, swelling in lower extremities, anasarca, palpitations  GI:  No abdominal pain, nausea, vomiting, diarrhea  Resp: No shortness of breath,  No coughing up of blood.No cough.No wheezing.  Skin:  no rash or lesions.  GU:  no dysuria, change in color of urine, no urgency or frequency.  No flank pain.  Musculoskeletal: No joint pain or swelling.  No decreased range of motion.  No back pain.  Psych: No change in mood or affect. No depression or anxiety.  No memory loss.   Exam  General appearance :Awake, alert, NAD, Speech Clear. HEENT: Atraumatic and Normocephalic, PERLA Neck: supple, no JVD. No cervical lymphadenopathy.  Chest: clear to auscultation bilaterally, no wheezing, rales or rhonchi CVS: S1 S2 regular, no murmurs.  Abdomen: Obese soft, NBS, NT, ND, no gaurding, rigidity or rebound. Extremities: No cyanosis, clubbing, B/L Lower Ext shows no edema,  Neurology: Awake alert, and oriented X 3, CN II-XII intact, Non focal Skin:No Rash or lesions Wounds: N/A    Data Review   Basic Metabolic Panel: No results found for this basename: NA, K, CL, CO2, GLUCOSE, BUN, CREATININE, CALCIUM, MG, PHOS,  in the last 168 hours Liver Function Tests: No results found for this  basename: AST, ALT, ALKPHOS, BILITOT, PROT, ALBUMIN,  in the last 168 hours  CBC: No results found for this basename: WBC, NEUTROABS, HGB, HCT, MCV, PLT,  in the last 168 hours ------------------------------------------------------------------------------------------------------------------ No results found for this basename: HGBA1C,  in the last 72 hours ------------------------------------------------------------------------------------------------------------------ No results found for this basename: CHOL, HDL, LDLCALC, TRIG, CHOLHDL, LDLDIRECT,  in the last 72 hours ------------------------------------------------------------------------------------------------------------------ No results found for this basename: TSH, T4TOTAL, FREET3, T3FREE, THYROIDAB,  in the last 72 hours ------------------------------------------------------------------------------------------------------------------ No results found for this basename: VITAMINB12, FOLATE, FERRITIN, TIBC, IRON, RETICCTPCT,  in the last 72 hours  Coagulation profile  No results found for this basename: INR, PROTIME,  in the last 168 hours    Assessment & Plan   Active Problems: Diabetes mellitus - Refilled her Levemir - check hemoglobin A1c, BMET  History of seizures: The patient has refills for Topamax  Hypertension: - Stable, refilled verapamil  Vitamin D deficiency: Refilled vitamin D, will check level today  History of asthma: - Currently stable, has refills for albuterol inhaler  Health screening - Flu shot today - CBC, BMET, hemoglobin A1c, lipid panel today - Ambulatory referral to OB/GYN for Pap smear - Screening mammogram ordered - Ambulatory referral for psychiatry for depression and establishing care  Follow-up in 4 weeks, followup on labs   Chantae Soo M.D. 10/07/2012, 5:45 PM

## 2012-10-08 LAB — HEMOGLOBIN A1C
Hgb A1c MFr Bld: 7.4 % — ABNORMAL HIGH (ref <5.7)
Mean Plasma Glucose: 166 mg/dL — ABNORMAL HIGH (ref <117)

## 2012-10-10 ENCOUNTER — Telehealth: Payer: Self-pay | Admitting: Internal Medicine

## 2012-10-10 NOTE — Telephone Encounter (Signed)
Pharmacy calling to confirm dosage/directions for gabapentin (NEURONTIN) 300 MG capsule script.  Please f/u with [facility] at [phone].

## 2012-10-31 ENCOUNTER — Ambulatory Visit: Payer: PRIVATE HEALTH INSURANCE

## 2012-11-07 ENCOUNTER — Ambulatory Visit: Payer: PRIVATE HEALTH INSURANCE | Admitting: Internal Medicine

## 2012-11-08 ENCOUNTER — Ambulatory Visit: Payer: PRIVATE HEALTH INSURANCE

## 2012-12-05 ENCOUNTER — Encounter: Payer: PRIVATE HEALTH INSURANCE | Admitting: Medical

## 2012-12-19 ENCOUNTER — Other Ambulatory Visit: Payer: Self-pay | Admitting: Internal Medicine

## 2012-12-20 ENCOUNTER — Other Ambulatory Visit: Payer: Self-pay | Admitting: Emergency Medicine

## 2012-12-20 MED ORDER — GABAPENTIN 300 MG PO CAPS: 600.0000 mg | ORAL_CAPSULE | Freq: Three times a day (TID) | ORAL | Status: DC

## 2012-12-23 ENCOUNTER — Ambulatory Visit (HOSPITAL_COMMUNITY): Payer: PRIVATE HEALTH INSURANCE | Admitting: Psychiatry

## 2012-12-26 ENCOUNTER — Ambulatory Visit: Payer: PRIVATE HEALTH INSURANCE

## 2013-01-11 ENCOUNTER — Other Ambulatory Visit: Payer: Self-pay | Admitting: Internal Medicine

## 2013-01-11 ENCOUNTER — Telehealth: Payer: Self-pay | Admitting: *Deleted

## 2013-01-11 NOTE — Telephone Encounter (Signed)
Contacted pt about a medication refill. Left a voicemail for pt to give [name] a call back.

## 2013-02-01 ENCOUNTER — Emergency Department (HOSPITAL_COMMUNITY): Payer: PRIVATE HEALTH INSURANCE

## 2013-02-01 ENCOUNTER — Emergency Department (HOSPITAL_COMMUNITY)
Admission: EM | Admit: 2013-02-01 | Discharge: 2013-02-01 | Disposition: A | Discharge status: Home / Self Care | Payer: PRIVATE HEALTH INSURANCE | Attending: Emergency Medicine | Admitting: Emergency Medicine

## 2013-02-01 ENCOUNTER — Encounter (HOSPITAL_COMMUNITY): Payer: Self-pay | Admitting: Emergency Medicine

## 2013-02-01 DIAGNOSIS — Z8673 Personal history of transient ischemic attack (TIA), and cerebral infarction without residual deficits: Secondary | ICD-10-CM | POA: Insufficient documentation

## 2013-02-01 DIAGNOSIS — Z86718 Personal history of other venous thrombosis and embolism: Secondary | ICD-10-CM | POA: Insufficient documentation

## 2013-02-01 DIAGNOSIS — K219 Gastro-esophageal reflux disease without esophagitis: Secondary | ICD-10-CM | POA: Insufficient documentation

## 2013-02-01 DIAGNOSIS — Z87442 Personal history of urinary calculi: Secondary | ICD-10-CM | POA: Insufficient documentation

## 2013-02-01 DIAGNOSIS — I10 Essential (primary) hypertension: Secondary | ICD-10-CM | POA: Insufficient documentation

## 2013-02-01 DIAGNOSIS — G43909 Migraine, unspecified, not intractable, without status migrainosus: Secondary | ICD-10-CM | POA: Insufficient documentation

## 2013-02-01 DIAGNOSIS — I252 Old myocardial infarction: Secondary | ICD-10-CM | POA: Insufficient documentation

## 2013-02-01 DIAGNOSIS — Z79899 Other long term (current) drug therapy: Secondary | ICD-10-CM | POA: Insufficient documentation

## 2013-02-01 DIAGNOSIS — N12 Tubulo-interstitial nephritis, not specified as acute or chronic: Secondary | ICD-10-CM | POA: Insufficient documentation

## 2013-02-01 DIAGNOSIS — Z8701 Personal history of pneumonia (recurrent): Secondary | ICD-10-CM | POA: Insufficient documentation

## 2013-02-01 DIAGNOSIS — Z794 Long term (current) use of insulin: Secondary | ICD-10-CM | POA: Insufficient documentation

## 2013-02-01 DIAGNOSIS — R630 Anorexia: Secondary | ICD-10-CM | POA: Insufficient documentation

## 2013-02-01 DIAGNOSIS — G4730 Sleep apnea, unspecified: Secondary | ICD-10-CM | POA: Insufficient documentation

## 2013-02-01 DIAGNOSIS — J449 Chronic obstructive pulmonary disease, unspecified: Secondary | ICD-10-CM | POA: Insufficient documentation

## 2013-02-01 DIAGNOSIS — IMO0002 Reserved for concepts with insufficient information to code with codable children: Secondary | ICD-10-CM | POA: Insufficient documentation

## 2013-02-01 DIAGNOSIS — M129 Arthropathy, unspecified: Secondary | ICD-10-CM | POA: Insufficient documentation

## 2013-02-01 DIAGNOSIS — Z87891 Personal history of nicotine dependence: Secondary | ICD-10-CM | POA: Insufficient documentation

## 2013-02-01 DIAGNOSIS — E1149 Type 2 diabetes mellitus with other diabetic neurological complication: Secondary | ICD-10-CM | POA: Insufficient documentation

## 2013-02-01 DIAGNOSIS — J4489 Other specified chronic obstructive pulmonary disease: Secondary | ICD-10-CM | POA: Insufficient documentation

## 2013-02-01 DIAGNOSIS — Z7902 Long term (current) use of antithrombotics/antiplatelets: Secondary | ICD-10-CM | POA: Insufficient documentation

## 2013-02-01 DIAGNOSIS — E1142 Type 2 diabetes mellitus with diabetic polyneuropathy: Secondary | ICD-10-CM | POA: Insufficient documentation

## 2013-02-01 DIAGNOSIS — R011 Cardiac murmur, unspecified: Secondary | ICD-10-CM | POA: Insufficient documentation

## 2013-02-01 DIAGNOSIS — G40909 Epilepsy, unspecified, not intractable, without status epilepticus: Secondary | ICD-10-CM | POA: Insufficient documentation

## 2013-02-01 LAB — COMPREHENSIVE METABOLIC PANEL
ALT: 13 U/L (ref 0–35)
AST: 12 U/L (ref 0–37)
Albumin: 3.8 g/dL (ref 3.5–5.2)
Alkaline Phosphatase: 86 U/L (ref 39–117)
BUN: 14 mg/dL (ref 6–23)
CO2: 20 mEq/L (ref 19–32)
Calcium: 9 mg/dL (ref 8.4–10.5)
Chloride: 104 mEq/L (ref 96–112)
Creatinine, Ser: 0.91 mg/dL (ref 0.50–1.10)
GFR calc Af Amer: 85 mL/min — ABNORMAL LOW (ref >90)
GFR calc non Af Amer: 73 mL/min — ABNORMAL LOW (ref >90)
Glucose, Bld: 102 mg/dL — ABNORMAL HIGH (ref 70–99)
Potassium: 4 mEq/L (ref 3.7–5.3)
Sodium: 139 mEq/L (ref 137–147)
Total Bilirubin: 0.2 mg/dL — ABNORMAL LOW (ref 0.3–1.2)
Total Protein: 8.1 g/dL (ref 6.0–8.3)

## 2013-02-01 LAB — URINALYSIS, ROUTINE W REFLEX MICROSCOPIC
Bilirubin Urine: NEGATIVE
Glucose, UA: NEGATIVE mg/dL
Ketones, ur: NEGATIVE mg/dL
Nitrite: NEGATIVE
Protein, ur: NEGATIVE mg/dL
Specific Gravity, Urine: 1.022 (ref 1.005–1.030)
Urobilinogen, UA: 1 mg/dL (ref 0.0–1.0)
pH: 6.5 (ref 5.0–8.0)

## 2013-02-01 LAB — URINE MICROSCOPIC-ADD ON

## 2013-02-01 LAB — CBC WITH DIFFERENTIAL/PLATELET
Basophils Absolute: 0 10*3/uL (ref 0.0–0.1)
Basophils Relative: 0 % (ref 0–1)
Eosinophils Absolute: 0 10*3/uL (ref 0.0–0.7)
Eosinophils Relative: 0 % (ref 0–5)
HCT: 39.9 % (ref 36.0–46.0)
Hemoglobin: 13.3 g/dL (ref 12.0–15.0)
Lymphocytes Relative: 31 % (ref 12–46)
Lymphs Abs: 2.5 10*3/uL (ref 0.7–4.0)
MCH: 30.4 pg (ref 26.0–34.0)
MCHC: 33.3 g/dL (ref 30.0–36.0)
MCV: 91.1 fL (ref 78.0–100.0)
Monocytes Absolute: 0.7 10*3/uL (ref 0.1–1.0)
Monocytes Relative: 9 % (ref 3–12)
Neutro Abs: 4.9 10*3/uL (ref 1.7–7.7)
Neutrophils Relative %: 60 % (ref 43–77)
Platelets: 289 10*3/uL (ref 150–400)
RBC: 4.38 MIL/uL (ref 3.87–5.11)
RDW: 13.7 % (ref 11.5–15.5)
WBC: 8.2 10*3/uL (ref 4.0–10.5)

## 2013-02-01 MED ORDER — ONDANSETRON HCL 4 MG/2ML IJ SOLN
4.0000 mg | Freq: Once | INTRAMUSCULAR | Status: AC
  Administered 2013-02-01: 4 mg via INTRAVENOUS
  Filled 2013-02-01: qty 2

## 2013-02-01 MED ORDER — HYDROCODONE-ACETAMINOPHEN 5-325 MG PO TABS: 2.0000 | ORAL_TABLET | ORAL | Status: DC | PRN

## 2013-02-01 MED ORDER — HYDROMORPHONE HCL PF 1 MG/ML IJ SOLN
1.0000 mg | Freq: Once | INTRAMUSCULAR | Status: AC
  Administered 2013-02-01: 1 mg via INTRAVENOUS
  Filled 2013-02-01: qty 1

## 2013-02-01 MED ORDER — SODIUM CHLORIDE 0.9 % IV BOLUS (SEPSIS)
1000.0000 mL | Freq: Once | INTRAVENOUS | Status: AC
  Administered 2013-02-01: 1000 mL via INTRAVENOUS

## 2013-02-01 MED ORDER — CIPROFLOXACIN HCL 500 MG PO TABS: 500.0000 mg | ORAL_TABLET | Freq: Two times a day (BID) | ORAL | Status: DC

## 2013-02-01 MED ORDER — CEFTRIAXONE SODIUM 1 G IJ SOLR
1.0000 g | Freq: Once | INTRAMUSCULAR | Status: AC
Start: 2013-02-01 — End: 2013-02-01
  Administered 2013-02-01: 1 g via INTRAVENOUS
  Filled 2013-02-01: qty 10

## 2013-02-01 MED ORDER — ONDANSETRON 4 MG PO TBDP: 4.0000 mg | ORAL_TABLET | Freq: Three times a day (TID) | ORAL | Status: DC | PRN

## 2013-02-01 MED ORDER — IOHEXOL 300 MG/ML  SOLN
100.0000 mL | Freq: Once | INTRAMUSCULAR | Status: AC | PRN
  Administered 2013-02-01: 100 mL via INTRAVENOUS

## 2013-02-01 MED ORDER — MORPHINE SULFATE 4 MG/ML IJ SOLN
4.0000 mg | INTRAMUSCULAR | Status: DC | PRN
  Administered 2013-02-01: 4 mg via INTRAVENOUS
  Filled 2013-02-01: qty 1

## 2013-02-01 NOTE — ED Provider Notes (Signed)
I have asked to followup on this patient by my partner Dr. Maryan Rued. I seen examined the patient. She has signs of urinary tract infection. Minimal hematuria. She is fairly comfortable. CT scan of the abdomen pelvis with contrast was obtained. No sign of obstructive uropathy or hydronephrosis. No appendicitis. Given IV Rocephin. I discussed treatment plan with her. Plan will be home treatment with antibiotic pain medication physician followup.  Tanna Furry, MD 02/01/13 [phone]

## 2013-02-01 NOTE — ED Provider Notes (Signed)
CSN: [id number]     Arrival date & time 02/01/13  1336 History   First MD Initiated Contact with Patient 02/01/13 1353     Chief Complaint  Patient presents with  . Abdominal Pain   (Consider location/radiation/quality/duration/timing/severity/associated sxs/prior Treatment) Patient is a 48 y.o. female presenting with abdominal pain. The history is provided by the patient.  Abdominal Pain Pain location:  R flank and RLQ Pain quality: gnawing, sharp, shooting and stabbing   Pain radiates to:  Back Pain severity:  Severe Onset quality:  Sudden Duration:  1 week Timing:  Constant Progression:  Waxing and waning Chronicity:  Recurrent Context comment:  Remembers feeling similar pain when she had kidney stones Relieved by:  Nothing Worsened by:  Urination Ineffective treatments:  NSAIDs Associated symptoms: anorexia and dysuria   Associated symptoms: no chest pain, no chills, no cough, no diarrhea, no fever, no hematuria, no nausea, no shortness of breath and no vomiting   Risk factors comment:  Multiple medical problems including kidney stones requiring stenting in the past   Past Medical History  Diagnosis Date  . Hypertension   . Asthma   . Seizures   . Heart murmur     born with   . Dysrhythmia     skipped beats  . Myocardial infarction     hi of x 2  last one 2003   . COPD (chronic obstructive pulmonary disease)     chronic bronchitis   . Pneumonia     hx of 2009  . GERD (gastroesophageal reflux disease)   . Headache(784.0)     migraines   . Neuropathy     due to diabetes   . Dizziness     secondary to diabetes and hypertension   . Arthritis     joint pain   . Hx of blood clots     hematomas removed from left side of brain from 75mo to 89yrold   . Epilepsy idiopathic petit mal     last seizure 2012;controlled w/ topomax  . Diabetic neuropathy, painful     FEET AND HANDS  . Diabetes mellitus     since age 89type 2 IDDM  . Sleep apnea     sleep study  2010 @ [facility];does not use Cpap ; mild  . MIGRAINE HEADACHE 09/14/2006    Qualifier: Diagnosis of  By: MaHassell DoneNP, NyTori Milks  . Glaucoma     NOT ON ANY EYE DROPS   . Legally blind     left eye   . Stroke     last 2003  RESIDUAL LEFT LEG WEAKNESS--NO OTHER RESIDUAL PROBLEMS  . CVA 11/14/2007    Qualifier: Diagnosis of  By: MaHassell DoneNP, NyTori Milks  . Pain 09/23/11    LOWER BACK AND RIGHT SIDE--PT HAS RIGHT URETERAL STONE  . Nephrolithiasis     frequent urination , urination at nite  PT SEEN IN ER 09/22/11 FOR BACK AND RT SIDED PAIN--HAS KNOWN STONE RT URETER AND UA IN ER SHOWED UTI   Past Surgical History  Procedure Laterality Date  . Kidney stone removal    . Brain hematoma evacutation      five procedures total, first procedure when [age]onths old, last at [age]ears of age.  . Shunt removal      shunt inserted at age [age]emoved at age 48 . Eye surgery    . Retinal detachment surgery  1990  . Vaginal hysterectomy  1996  .  Incisional hernia repair  05/05/2011    Procedure: LAPAROSCOPIC INCISIONAL HERNIA;  Surgeon: Rolm Bookbinder, MD;  Location: [place];  Service: General;  Laterality: N/A;  . Mass excision  05/05/2011    Procedure: EXCISION MASS;  Surgeon: Rolm Bookbinder, MD;  Location: [place];  Service: General;  Laterality: Right;  . Hernia repair  05/05/11    lap incisional hernia  . Pcnl    . Cystoscopy/retrograde/ureteroscopy  08/24/2011    Procedure: CYSTOSCOPY/RETROGRADE/URETEROSCOPY;  Surgeon: Molli Hazard, MD;  Location: [facility];  Service: Urology;  Laterality: Right;  Cysto, Right retrograde Pyelogram, right stent placement.   . Cystoscopy with ureteroscopy  09/24/2011    Procedure: CYSTOSCOPY WITH URETEROSCOPY;  Surgeon: Malka So, MD;  Location: [facility];  Service: Urology;  Laterality: Right;  Balloon dilation right ureter   . Cystoscopy w/ retrogrades  09/24/2011    Procedure: CYSTOSCOPY WITH RETROGRADE PYELOGRAM;  Surgeon: Malka So, MD;  Location: [facility];   Service: Urology;  Laterality: Right;   Family History  Problem Relation Age of Onset  . Cancer Mother     uterine  . Cancer Maternal Grandmother     brain  . Anesthesia problems Neg Hx    History  Substance Use Topics  . Smoking status: Former Smoker -- 1.00 packs/day for 30 years    Types: Cigarettes    Quit date: 01/20/1987  . Smokeless tobacco: Never Used     Comment: quit more than 20years  . Alcohol Use: No   OB History   Grav Para Term Preterm Abortions TAB SAB Ect Mult Living                 Review of Systems  Constitutional: Negative for fever and chills.  Respiratory: Negative for cough and shortness of breath.   Cardiovascular: Negative for chest pain.  Gastrointestinal: Positive for abdominal pain and anorexia. Negative for nausea, vomiting and diarrhea.  Genitourinary: Positive for dysuria. Negative for hematuria.  All other systems reviewed and are negative.    Allergies  Codeine and Lactose intolerance (gi)  Home Medications   Current Outpatient Rx  Name  Route  Sig  Dispense  Refill  . albuterol (PROVENTIL HFA;VENTOLIN HFA) 108 (90 BASE) MCG/ACT inhaler   Inhalation   Inhale 2 puffs into the lungs every 6 (six) hours as needed. Wheezing         . baclofen (LIORESAL) 10 MG tablet   Oral   Take 1 tablet (10 mg total) by mouth 3 (three) times daily.   90 each   4   . budesonide-formoterol (SYMBICORT) 160-4.5 MCG/ACT inhaler   Inhalation   Inhale 2 puffs into the lungs 2 (two) times daily.         . Cholecalciferol (VITAMIN D PO)   Oral   Take 1 tablet by mouth daily.         . clopidogrel (PLAVIX) 75 MG tablet   Oral   Take 1 tablet (75 mg total) by mouth daily.   30 tablet   6   . diclofenac sodium (VOLTAREN) 1 % GEL   Topical   Apply 2 g topically 4 (four) times daily.         [name] gabapentin (NEURONTIN) 300 MG capsule   Oral   Take 2 capsules (600 mg total) by mouth 3 (three) times daily.   180 capsule   2   . glipiZIDE  (GLUCOTROL XL) 10 MG 24 hr tablet   Oral  Take 20 mg by mouth every morning.          . insulin detemir (LEVEMIR) 100 UNIT/ML injection   Subcutaneous   Inject 0.3-0.4 mLs (30-40 Units total) into the skin 2 (two) times daily. 40 units every morning and 30 units every night.   5 pen   5   . omeprazole (PRILOSEC) 20 MG capsule   Oral   Take 2 capsules (40 mg total) by mouth daily.   30 capsule   3   . ondansetron (ZOFRAN) 8 MG tablet   Oral   Take 8 mg by mouth every 6 (six) hours. PT STATES Nausea FROM PREVIOUS HERNIA REPAIR AND FROM KIDNEY STONE ---TAKES ZOFRAN  AT 8AM AND 12 NOON AND 8 PM  EVERY DAY         . polyethylene glycol (MIRALAX / GLYCOLAX) packet   Oral   Take 17 g by mouth daily as needed. Constipation.         . promethazine (PHENERGAN) 25 MG tablet   Oral   Take 1 tablet (25 mg total) by mouth every 6 (six) hours as needed for nausea.   20 tablet   0   . simvastatin (ZOCOR) 20 MG tablet   Oral   Take 20 mg by mouth daily. TAKES IN AM         . Tamsulosin HCl (FLOMAX) 0.4 MG CAPS   Oral   Take 0.4 mg by mouth daily after breakfast.         . topiramate (TOPAMAX) 200 MG tablet   Oral   Take 200 mg by mouth 2 (two) times daily.         . traZODone (DESYREL) 150 MG tablet   Oral   Take 150 mg by mouth at bedtime.         . verapamil (VERELAN PM) 240 MG 24 hr capsule   Oral   Take 1 capsule (240 mg total) by mouth at bedtime.   30 capsule   4   . Vitamin D, Ergocalciferol, (DRISDOL) 50000 UNITS CAPS capsule   Oral   Take 1 capsule (50,000 Units total) by mouth every 7 (seven) days.   10 capsule   4    BP 141/87  Pulse 97  Temp(Src) 98.7 F (37.1 C) (Oral)  Resp 14  SpO2 98% Physical Exam  Nursing note and vitals reviewed. Constitutional: She is oriented to person, place, and time. She appears well-developed and well-nourished. No distress.  HENT:  Head: Normocephalic and atraumatic.  Mouth/Throat: Oropharynx is clear and  moist and mucous membranes are normal.  Eyes: Conjunctivae and EOM are normal. Pupils are equal, round, and reactive to light.  strabismus  Neck: Normal range of motion. Neck supple.  Cardiovascular: Normal rate, regular rhythm and intact distal pulses.   No murmur heard. Pulmonary/Chest: Effort normal and breath sounds normal. No respiratory distress. She has no wheezes. She has no rales.  Abdominal: Soft. She exhibits no distension. There is tenderness in the right upper quadrant and right lower quadrant. There is no rebound, no guarding and no CVA tenderness.    Right flank pain  Musculoskeletal: Normal range of motion. She exhibits no edema and no tenderness.  Neurological: She is alert and oriented to person, place, and time.  Skin: Skin is warm and dry. No rash noted. No erythema.  Psychiatric: She has a normal mood and affect. Her behavior is normal.    ED Course  Procedures (including critical care  time) Labs Review Labs Reviewed  COMPREHENSIVE METABOLIC PANEL - Abnormal; Notable for the following:    Glucose, Bld 102 (*)    Total Bilirubin 0.2 (*)    GFR calc non Af Amer 73 (*)    GFR calc Af Amer 85 (*)    All other components within normal limits  CBC WITH DIFFERENTIAL  URINALYSIS, ROUTINE W REFLEX MICROSCOPIC   Imaging Review No results found.  EKG Interpretation   None       MDM  No diagnosis found.  Patient with a history concerning for right-sided kidney stone versus pyelonephritis versus other renal pathology versus hepatitis. Low suspicion for cholecystitis at this time. Patient has not noticed any hematuria but has a prior history of renal stones and pyelocalcinosis requiring 3 stents 2 years ago.  Pt states pain feels similar today but no hematuria like last time.  Denies falls and anorexia but no N/V/D or fever.  Denies vaginal sx and hx of hysterectomy.  CBC, CMP, UA pending. Patient will need a CT based on the labs will decide if she needs a  contrasted her noncontrasted scan.  3:22 PM WBC and CMP wnl.  UA pending and pt checked out to Dr. Jeneen Rinks at 3:30.  Blanchie Dessert, MD 02/06/13 [phone]

## 2013-02-01 NOTE — ED Notes (Signed)
Pt c/o headache; lower back; abd pain; right sided pain; states feels like something moving in her stomach--previous history of hernia--states feels the same; pain with urination

## 2013-02-01 NOTE — Discharge Instructions (Signed)
Pyelonephritis, Adult Pyelonephritis is a kidney infection. In general, there are 2 main types of pyelonephritis:  Infections that come on quickly without any warning (acute pyelonephritis).  Infections that persist for a long period of time (chronic pyelonephritis). CAUSES  Two main causes of pyelonephritis are:  Bacteria traveling from the bladder to the kidney. This is a problem especially in pregnant women. The urine in the bladder can become filled with bacteria from multiple causes, including:  Inflammation of the prostate gland (prostatitis).  Sexual intercourse in females.  Bladder infection (cystitis).  Bacteria traveling from the bloodstream to the tissue part of the kidney. Problems that may increase your risk of getting a kidney infection include:  Diabetes.  Kidney stones or bladder stones.  Cancer.  Catheters placed in the bladder.  Other abnormalities of the kidney or ureter. SYMPTOMS   Abdominal pain.  Pain in the side or flank area.  Fever.  Chills.  Upset stomach.  Blood in the urine (dark urine).  Frequent urination.  Strong or persistent urge to urinate.  Burning or stinging when urinating. DIAGNOSIS  Your caregiver may diagnose your kidney infection based on your symptoms. A urine sample may also be taken. TREATMENT  In general, treatment depends on how severe the infection is.   If the infection is mild and caught early, your caregiver may treat you with oral antibiotics and send you home.  If the infection is more severe, the bacteria may have gotten into the bloodstream. This will require intravenous (IV) antibiotics and a hospital stay. Symptoms may include:  High fever.  Severe flank pain.  Shaking chills.  Even after a hospital stay, your caregiver may require you to be on oral antibiotics for a period of time.  Other treatments may be required depending upon the cause of the infection. HOME CARE INSTRUCTIONS   Take your  antibiotics as directed. Finish them even if you start to feel better.  Make an appointment to have your urine checked to make sure the infection is gone.  Drink enough fluids to keep your urine clear or pale yellow.  Take medicines for the bladder if you have urgency and frequency of urination as directed by your caregiver. SEEK IMMEDIATE MEDICAL CARE IF:   You have a fever or persistent symptoms for more than 2-3 days.  You have a fever and your symptoms suddenly get worse.  You are unable to take your antibiotics or fluids.  You develop shaking chills.  You experience extreme weakness or fainting.  There is no improvement after 2 days of treatment. MAKE SURE YOU:  Understand these instructions.  Will watch your condition.  Will get help right away if you are not doing well or get worse. Document Released: 01/05/2005 Document Revised: 07/07/2011 Document Reviewed: 06/11/2010 [facility] Patient Information 2014 [place], [place].

## 2013-02-02 LAB — URINE CULTURE
Colony Count: NO GROWTH
Culture: NO GROWTH

## 2013-02-06 ENCOUNTER — Encounter (HOSPITAL_COMMUNITY): Payer: Self-pay | Admitting: Emergency Medicine

## 2013-02-06 ENCOUNTER — Emergency Department (HOSPITAL_COMMUNITY): Payer: PRIVATE HEALTH INSURANCE

## 2013-02-06 ENCOUNTER — Emergency Department (HOSPITAL_COMMUNITY)
Admission: EM | Admit: 2013-02-06 | Discharge: 2013-02-06 | Disposition: A | Discharge status: Home / Self Care | Payer: PRIVATE HEALTH INSURANCE | Attending: Emergency Medicine | Admitting: Emergency Medicine

## 2013-02-06 DIAGNOSIS — N760 Acute vaginitis: Secondary | ICD-10-CM | POA: Insufficient documentation

## 2013-02-06 DIAGNOSIS — R7309 Other abnormal glucose: Secondary | ICD-10-CM

## 2013-02-06 DIAGNOSIS — K219 Gastro-esophageal reflux disease without esophagitis: Secondary | ICD-10-CM | POA: Insufficient documentation

## 2013-02-06 DIAGNOSIS — H548 Legal blindness, as defined in USA: Secondary | ICD-10-CM | POA: Insufficient documentation

## 2013-02-06 DIAGNOSIS — Z8673 Personal history of transient ischemic attack (TIA), and cerebral infarction without residual deficits: Secondary | ICD-10-CM | POA: Insufficient documentation

## 2013-02-06 DIAGNOSIS — A499 Bacterial infection, unspecified: Secondary | ICD-10-CM | POA: Insufficient documentation

## 2013-02-06 DIAGNOSIS — H409 Unspecified glaucoma: Secondary | ICD-10-CM | POA: Insufficient documentation

## 2013-02-06 DIAGNOSIS — B9689 Other specified bacterial agents as the cause of diseases classified elsewhere: Secondary | ICD-10-CM | POA: Insufficient documentation

## 2013-02-06 DIAGNOSIS — Z87442 Personal history of urinary calculi: Secondary | ICD-10-CM | POA: Insufficient documentation

## 2013-02-06 DIAGNOSIS — J4489 Other specified chronic obstructive pulmonary disease: Secondary | ICD-10-CM | POA: Insufficient documentation

## 2013-02-06 DIAGNOSIS — Z8701 Personal history of pneumonia (recurrent): Secondary | ICD-10-CM | POA: Insufficient documentation

## 2013-02-06 DIAGNOSIS — R109 Unspecified abdominal pain: Secondary | ICD-10-CM

## 2013-02-06 DIAGNOSIS — M545 Low back pain, unspecified: Secondary | ICD-10-CM

## 2013-02-06 DIAGNOSIS — E1149 Type 2 diabetes mellitus with other diabetic neurological complication: Secondary | ICD-10-CM | POA: Insufficient documentation

## 2013-02-06 DIAGNOSIS — Z86718 Personal history of other venous thrombosis and embolism: Secondary | ICD-10-CM | POA: Insufficient documentation

## 2013-02-06 DIAGNOSIS — N898 Other specified noninflammatory disorders of vagina: Secondary | ICD-10-CM

## 2013-02-06 DIAGNOSIS — Z792 Long term (current) use of antibiotics: Secondary | ICD-10-CM | POA: Insufficient documentation

## 2013-02-06 DIAGNOSIS — J449 Chronic obstructive pulmonary disease, unspecified: Secondary | ICD-10-CM | POA: Insufficient documentation

## 2013-02-06 DIAGNOSIS — G43909 Migraine, unspecified, not intractable, without status migrainosus: Secondary | ICD-10-CM | POA: Insufficient documentation

## 2013-02-06 DIAGNOSIS — Z79899 Other long term (current) drug therapy: Secondary | ICD-10-CM | POA: Insufficient documentation

## 2013-02-06 DIAGNOSIS — Z7902 Long term (current) use of antithrombotics/antiplatelets: Secondary | ICD-10-CM | POA: Insufficient documentation

## 2013-02-06 DIAGNOSIS — I10 Essential (primary) hypertension: Secondary | ICD-10-CM | POA: Insufficient documentation

## 2013-02-06 DIAGNOSIS — IMO0002 Reserved for concepts with insufficient information to code with codable children: Secondary | ICD-10-CM | POA: Insufficient documentation

## 2013-02-06 DIAGNOSIS — Z794 Long term (current) use of insulin: Secondary | ICD-10-CM | POA: Insufficient documentation

## 2013-02-06 DIAGNOSIS — R011 Cardiac murmur, unspecified: Secondary | ICD-10-CM | POA: Insufficient documentation

## 2013-02-06 DIAGNOSIS — Z87891 Personal history of nicotine dependence: Secondary | ICD-10-CM | POA: Insufficient documentation

## 2013-02-06 DIAGNOSIS — G40909 Epilepsy, unspecified, not intractable, without status epilepticus: Secondary | ICD-10-CM | POA: Insufficient documentation

## 2013-02-06 DIAGNOSIS — I252 Old myocardial infarction: Secondary | ICD-10-CM | POA: Insufficient documentation

## 2013-02-06 DIAGNOSIS — E1142 Type 2 diabetes mellitus with diabetic polyneuropathy: Secondary | ICD-10-CM | POA: Insufficient documentation

## 2013-02-06 DIAGNOSIS — G4730 Sleep apnea, unspecified: Secondary | ICD-10-CM | POA: Insufficient documentation

## 2013-02-06 DIAGNOSIS — M129 Arthropathy, unspecified: Secondary | ICD-10-CM | POA: Insufficient documentation

## 2013-02-06 LAB — URINE MICROSCOPIC-ADD ON

## 2013-02-06 LAB — URINALYSIS, ROUTINE W REFLEX MICROSCOPIC
Bilirubin Urine: NEGATIVE
Glucose, UA: 250 mg/dL — AB
Hgb urine dipstick: NEGATIVE
Ketones, ur: NEGATIVE mg/dL
Nitrite: NEGATIVE
Protein, ur: NEGATIVE mg/dL
Specific Gravity, Urine: 1.029 (ref 1.005–1.030)
Urobilinogen, UA: 1 mg/dL (ref 0.0–1.0)
pH: 6.5 (ref 5.0–8.0)

## 2013-02-06 LAB — COMPREHENSIVE METABOLIC PANEL WITH GFR
ALT: 12 U/L (ref 0–35)
AST: 10 U/L (ref 0–37)
Albumin: 3.5 g/dL (ref 3.5–5.2)
Alkaline Phosphatase: 75 U/L (ref 39–117)
BUN: 15 mg/dL (ref 6–23)
CO2: 21 meq/L (ref 19–32)
Calcium: 8.9 mg/dL (ref 8.4–10.5)
Chloride: 105 meq/L (ref 96–112)
Creatinine, Ser: 0.87 mg/dL (ref 0.50–1.10)
GFR calc Af Amer: 90 mL/min — ABNORMAL LOW
GFR calc non Af Amer: 78 mL/min — ABNORMAL LOW
Glucose, Bld: 197 mg/dL — ABNORMAL HIGH (ref 70–99)
Potassium: 3.8 meq/L (ref 3.7–5.3)
Sodium: 138 meq/L (ref 137–147)
Total Bilirubin: <0.2 mg/dL — ABNORMAL LOW (ref 0.3–1.2)
Total Protein: 7.3 g/dL (ref 6.0–8.3)

## 2013-02-06 LAB — WET PREP, GENITAL
Trich, Wet Prep: NONE SEEN
Yeast Wet Prep HPF POC: NONE SEEN

## 2013-02-06 LAB — CBC
HCT: 36.7 % (ref 36.0–46.0)
Hemoglobin: 11.9 g/dL — ABNORMAL LOW (ref 12.0–15.0)
MCH: 29.8 pg (ref 26.0–34.0)
MCHC: 32.4 g/dL (ref 30.0–36.0)
MCV: 91.8 fL (ref 78.0–100.0)
Platelets: 249 10*3/uL (ref 150–400)
RBC: 4 MIL/uL (ref 3.87–5.11)
RDW: 13.7 % (ref 11.5–15.5)
WBC: 7.9 10*3/uL (ref 4.0–10.5)

## 2013-02-06 LAB — LIPASE, BLOOD: Lipase: 29 U/L (ref 11–59)

## 2013-02-06 MED ORDER — DOXYCYCLINE HYCLATE 100 MG PO CAPS: 100.0000 mg | ORAL_CAPSULE | Freq: Two times a day (BID) | ORAL | Status: DC

## 2013-02-06 MED ORDER — MORPHINE SULFATE 4 MG/ML IJ SOLN
4.0000 mg | Freq: Once | INTRAMUSCULAR | Status: AC
  Administered 2013-02-06: 4 mg via INTRAVENOUS
  Filled 2013-02-06: qty 1

## 2013-02-06 MED ORDER — METRONIDAZOLE 500 MG PO TABS: 500.0000 mg | ORAL_TABLET | Freq: Two times a day (BID) | ORAL | Status: DC

## 2013-02-06 MED ORDER — IBUPROFEN 800 MG PO TABS: 800.0000 mg | ORAL_TABLET | Freq: Three times a day (TID) | ORAL | Status: DC

## 2013-02-06 MED ORDER — SODIUM CHLORIDE 0.9 % IV BOLUS (SEPSIS): 1000.0000 mL | Freq: Once | INTRAVENOUS | Status: DC

## 2013-02-06 MED ORDER — ONDANSETRON HCL 4 MG/2ML IJ SOLN
4.0000 mg | Freq: Once | INTRAMUSCULAR | Status: AC
  Administered 2013-02-06: 4 mg via INTRAVENOUS
  Filled 2013-02-06: qty 2

## 2013-02-06 NOTE — ED Provider Notes (Signed)
CSN: [id number]     Arrival date & time 02/06/13  1602 History   First MD Initiated Contact with Patient 02/06/13 1745     Chief Complaint  Patient presents with  . Back Pain    right  . Flank Pain    right   (Consider location/radiation/quality/duration/timing/severity/associated sxs/prior Treatment) HPI Comments: [name] is a 48 y.o. year-old female with a past medical history of kidney stones, obesity, GERD,  HTN, DM, OAS presenting the Emergency Department with a chief complaint of bilateral lower back pain and abdominal pain.  The patient reports being evaluated in the ED for similar complaints on 5 days ago.  She reports she finished the antibiotics as prescribed.  The patient reports dysuria today, without hematuria. She reports 3 month history of abnormal discharge.  The patient reports wearing a large pad due to increase amount of discharge. Abdominal surgeries: partial hysterectomy, hernia repair. Urologist: Jasmine December    Patient is a 48 y.o. female presenting with back pain and flank pain. The history is provided by the patient and medical records. No language interpreter was used.  Back Pain Associated symptoms: abdominal pain and dysuria   Associated symptoms: no fever, no numbness and no weakness   Flank Pain Associated symptoms include abdominal pain and nausea. Pertinent negatives include no chills, coughing, fever, numbness or weakness.    Past Medical History  Diagnosis Date  . Hypertension   . Asthma   . Seizures   . Heart murmur     born with   . Dysrhythmia     skipped beats  . Myocardial infarction     hi of x 2  last one 2003   . COPD (chronic obstructive pulmonary disease)     chronic bronchitis   . Pneumonia     hx of 2009  . GERD (gastroesophageal reflux disease)   . Headache(784.0)     migraines   . Neuropathy     due to diabetes   . Dizziness     secondary to diabetes and hypertension   . Arthritis     joint pain   . Hx of blood  clots     hematomas removed from left side of brain from 66mo to 89yrold   . Epilepsy idiopathic petit mal     last seizure 2012;controlled w/ topomax  . Diabetic neuropathy, painful     FEET AND HANDS  . Diabetes mellitus     since age 89type 2 IDDM  . Sleep apnea     sleep study 2010 @ [facility];does not use Cpap ; mild  . MIGRAINE HEADACHE 09/14/2006    Qualifier: Diagnosis of  By: MaHassell DoneNP, NyTori Milks  . Glaucoma     NOT ON ANY EYE DROPS   . Legally blind     left eye   . Stroke     last 2003  RESIDUAL LEFT LEG WEAKNESS--NO OTHER RESIDUAL PROBLEMS  . CVA 11/14/2007    Qualifier: Diagnosis of  By: MaHassell DoneNP, NyTori Milks  . Pain 09/23/11    LOWER BACK AND RIGHT SIDE--PT HAS RIGHT URETERAL STONE  . Nephrolithiasis     frequent urination , urination at nite  PT SEEN IN ER 09/22/11 FOR BACK AND RT SIDED PAIN--HAS KNOWN STONE RT URETER AND UA IN ER SHOWED UTI   Past Surgical History  Procedure Laterality Date  . Kidney stone removal    . Brain hematoma evacutation  five procedures total, first procedure when 5 months old, last at 48 years of age.  . Shunt removal      shunt inserted at age 41 removed at age 56   . Eye surgery    . Retinal detachment surgery  1990  . Vaginal hysterectomy  1996  . Incisional hernia repair  05/05/2011    Procedure: LAPAROSCOPIC INCISIONAL HERNIA;  Surgeon: Rolm Bookbinder, MD;  Location: [facility];  Service: General;  Laterality: N/A;  . Mass excision  05/05/2011    Procedure: EXCISION MASS;  Surgeon: Rolm Bookbinder, MD;  Location: [facility];  Service: General;  Laterality: Right;  . Hernia repair  05/05/11    lap incisional hernia  . Pcnl    . Cystoscopy/retrograde/ureteroscopy  08/24/2011    Procedure: CYSTOSCOPY/RETROGRADE/URETEROSCOPY;  Surgeon: Molli Hazard, MD;  Location: [facility];  Service: Urology;  Laterality: Right;  Cysto, Right retrograde Pyelogram, right stent placement.   . Cystoscopy with ureteroscopy  09/24/2011     Procedure: CYSTOSCOPY WITH URETEROSCOPY;  Surgeon: Malka So, MD;  Location: [facility];  Service: Urology;  Laterality: Right;  Balloon dilation right ureter   . Cystoscopy w/ retrogrades  09/24/2011    Procedure: CYSTOSCOPY WITH RETROGRADE PYELOGRAM;  Surgeon: Malka So, MD;  Location: [facility];  Service: Urology;  Laterality: Right;   Family History  Problem Relation Age of Onset  . Cancer Mother     uterine  . Cancer Maternal Grandmother     brain  . Anesthesia problems Neg Hx    History  Substance Use Topics  . Smoking status: Former Smoker -- 1.00 packs/day for 30 years    Types: Cigarettes    Quit date: 01/20/1987  . Smokeless tobacco: Never Used     Comment: quit more than 20years  . Alcohol Use: No   OB History   Grav Para Term Preterm Abortions TAB SAB Ect Mult Living                 Review of Systems  Constitutional: Negative for fever and chills.  Respiratory: Negative for cough.   Gastrointestinal: Positive for nausea and abdominal pain. Negative for diarrhea, constipation, blood in stool, abdominal distention and anal bleeding.  Genitourinary: Positive for dysuria, flank pain and vaginal discharge. Negative for urgency and hematuria.  Musculoskeletal: Positive for back pain. Negative for gait problem.  Neurological: Negative for weakness and numbness.    Allergies  Codeine and Lactose intolerance (gi)  Home Medications   Current Outpatient Rx  Name  Route  Sig  Dispense  Refill  . albuterol (PROVENTIL HFA;VENTOLIN HFA) 108 (90 BASE) MCG/ACT inhaler   Inhalation   Inhale 2 puffs into the lungs every 6 (six) hours as needed. Wheezing         . baclofen (LIORESAL) 10 MG tablet   Oral   Take 1 tablet (10 mg total) by mouth 3 (three) times daily.   90 each   4   . budesonide-formoterol (SYMBICORT) 160-4.5 MCG/ACT inhaler   Inhalation   Inhale 2 puffs into the lungs 2 (two) times daily.         . clopidogrel (PLAVIX) 75 MG tablet   Oral   Take 1  tablet (75 mg total) by mouth daily.   30 tablet   6   . FLUoxetine (PROZAC) 20 MG capsule   Oral   Take 60 mg by mouth daily at 12 noon.         [name]  gabapentin (NEURONTIN) 300 MG capsule   Oral   Take 2 capsules (600 mg total) by mouth 3 (three) times daily.   180 capsule   2   . glipiZIDE (GLUCOTROL XL) 10 MG 24 hr tablet   Oral   Take 20 mg by mouth every morning.          [name] HYDROcodone-acetaminophen (NORCO/VICODIN) 5-325 MG per tablet   Oral   Take 2 tablets by mouth every 4 (four) hours as needed.   10 tablet   0   . insulin detemir (LEVEMIR) 100 UNIT/ML injection   Subcutaneous   Inject 30-40 Units into the skin 2 (two) times daily. 30 units every morning and 40 units every night.         [name] omeprazole (PRILOSEC) 20 MG capsule   Oral   Take 40 mg by mouth daily.         . ondansetron (ZOFRAN ODT) 4 MG disintegrating tablet   Oral   Take 1 tablet (4 mg total) by mouth every 8 (eight) hours as needed for nausea.   6 tablet   0   . ondansetron (ZOFRAN) 8 MG tablet   Oral   Take 8 mg by mouth every 6 (six) hours. PT STATES Nausea FROM PREVIOUS HERNIA REPAIR AND FROM KIDNEY STONE ---TAKES ZOFRAN  AT 8AM AND 12 NOON AND 8 PM  EVERY DAY         . polyethylene glycol (MIRALAX / GLYCOLAX) packet   Oral   Take 17 g by mouth daily as needed. Constipation.         . promethazine (PHENERGAN) 25 MG tablet   Oral   Take 1 tablet (25 mg total) by mouth every 6 (six) hours as needed for nausea.   20 tablet   0   . simvastatin (ZOCOR) 20 MG tablet   Oral   Take 20 mg by mouth daily. TAKES IN AM         . Tamsulosin HCl (FLOMAX) 0.4 MG CAPS   Oral   Take 0.4 mg by mouth daily after breakfast.         . topiramate (TOPAMAX) 200 MG tablet   Oral   Take 200 mg by mouth 2 (two) times daily.         . traZODone (DESYREL) 150 MG tablet   Oral   Take 150 mg by mouth at bedtime.         . verapamil (VERELAN PM) 240 MG 24 hr capsule   Oral   Take 1  capsule (240 mg total) by mouth at bedtime.   30 capsule   4   . Vitamin D, Ergocalciferol, (DRISDOL) 50000 UNITS CAPS capsule   Oral   Take 50,000 Units by mouth every 7 (seven) days. Thursday         . doxycycline (VIBRAMYCIN) 100 MG capsule   Oral   Take 1 capsule (100 mg total) by mouth 2 (two) times daily. With meals   14 capsule   0   . ibuprofen (ADVIL,MOTRIN) 800 MG tablet   Oral   Take 1 tablet (800 mg total) by mouth 3 (three) times daily. With meals   30 tablet   0   . metroNIDAZOLE (FLAGYL) 500 MG tablet   Oral   Take 1 tablet (500 mg total) by mouth 2 (two) times daily.   14 tablet   0    BP 151/91  Pulse 67  Temp(Src) 97.6 F (36.4  C) (Oral)  Resp 18  SpO2 100% Physical Exam  Nursing note and vitals reviewed. Constitutional: She is oriented to person, place, and time. She appears well-developed and well-nourished. No distress.  Exam limited by patient's body habitus.    HENT:  Head: Normocephalic and atraumatic.  Eyes: No scleral icterus.  Neck: Neck supple.  Cardiovascular: Normal rate and regular rhythm.   Pulmonary/Chest: Effort normal and breath sounds normal. No respiratory distress. She has no wheezes. She has no rales.  Abdominal: Soft. Bowel sounds are normal. There is tenderness in the right lower quadrant and suprapubic area. There is no rigidity, no rebound, no guarding, no CVA tenderness, no tenderness at McBurney's point and negative Murphy's sign.  Multiple well healing linear scars non-tender. Negative Rosvings.  Genitourinary:  Copious amount of white discharge in the vaginal vault.  Cervix surgically absent.  Musculoskeletal:       Back:  No midline C-spine, T-spine, or L-spine tenderness with no step-offs, crepitus, or deformities noted.   Neurological: She is alert and oriented to person, place, and time.  Skin: Skin is warm and dry. No rash noted.  Psychiatric: She has a normal mood and affect. Her behavior is normal.    ED  Course  Procedures (including critical care time) Labs Review Labs Reviewed  WET PREP, GENITAL - Abnormal; Notable for the following:    Clue Cells Wet Prep HPF POC MODERATE (*)    WBC, Wet Prep HPF POC RARE (*)    All other components within normal limits  URINALYSIS, ROUTINE W REFLEX MICROSCOPIC - Abnormal; Notable for the following:    APPearance CLOUDY (*)    Glucose, UA 250 (*)    Leukocytes, UA MODERATE (*)    All other components within normal limits  COMPREHENSIVE METABOLIC PANEL - Abnormal; Notable for the following:    Glucose, Bld 197 (*)    Total Bilirubin <0.2 (*)    GFR calc non Af Amer 78 (*)    GFR calc Af Amer 90 (*)    All other components within normal limits  CBC - Abnormal; Notable for the following:    Hemoglobin 11.9 (*)    All other components within normal limits  URINE MICROSCOPIC-ADD ON - Abnormal; Notable for the following:    Squamous Epithelial / LPF MANY (*)    Crystals CA OXALATE CRYSTALS (*)    All other components within normal limits  GC/CHLAMYDIA PROBE AMP  LIPASE, BLOOD   Imaging Review US Transvaginal Non-ob  02/06/2013   CLINICAL DATA:  Pelvic pain.  EXAM: TRANSABDOMINAL AND TRANSVAGINAL ULTRASOUND OF PELVIS  DOPPLER ULTRASOUND OF OVARIES  TECHNIQUE: Both transabdominal and transvaginal ultrasound examinations of the pelvis were performed. Transabdominal technique was performed for global imaging of the pelvis including uterus, ovaries, adnexal regions, and pelvic cul-de-sac.  It was necessary to proceed with endovaginal exam following the transabdominal exam to visualize the ovaries. Color and duplex Doppler ultrasound was utilized to evaluate blood flow to the ovaries.  COMPARISON:  None.  FINDINGS: Uterus  Removed.  Right ovary  Measurements: 3.0 x 3.0 x 2.8 cm. Normal appearance/no adnexal mass.  Left ovary  Measurements: 3.2 x 2.2 x 2.3 cm. Normal appearance/no adnexal mass.  Pulsed Doppler evaluation of both ovaries demonstrates normal  low-resistance arterial and venous waveforms.  Other findings  Trace free pelvic fluid is identified.  IMPRESSION: Negative for ovarian torsion. No acute finding. Status post hysterectomy.   Electronically Signed   By: Inge Rise M.D.  On: 02/06/2013 21:28   US Pelvis Complete  02/06/2013   CLINICAL DATA:  Pelvic pain.  EXAM: TRANSABDOMINAL AND TRANSVAGINAL ULTRASOUND OF PELVIS  DOPPLER ULTRASOUND OF OVARIES  TECHNIQUE: Both transabdominal and transvaginal ultrasound examinations of the pelvis were performed. Transabdominal technique was performed for global imaging of the pelvis including uterus, ovaries, adnexal regions, and pelvic cul-de-sac.  It was necessary to proceed with endovaginal exam following the transabdominal exam to visualize the ovaries. Color and duplex Doppler ultrasound was utilized to evaluate blood flow to the ovaries.  COMPARISON:  None.  FINDINGS: Uterus  Removed.  Right ovary  Measurements: 3.0 x 3.0 x 2.8 cm. Normal appearance/no adnexal mass.  Left ovary  Measurements: 3.2 x 2.2 x 2.3 cm. Normal appearance/no adnexal mass.  Pulsed Doppler evaluation of both ovaries demonstrates normal low-resistance arterial and venous waveforms.  Other findings  Trace free pelvic fluid is identified.  IMPRESSION: Negative for ovarian torsion. No acute finding. Status post hysterectomy.   Electronically Signed   By: Inge Rise M.D.   On: 02/06/2013 21:28   [name] Art/ven Flow Abd Pelv Doppler  02/06/2013   CLINICAL DATA:  Pelvic pain.  EXAM: TRANSABDOMINAL AND TRANSVAGINAL ULTRASOUND OF PELVIS  DOPPLER ULTRASOUND OF OVARIES  TECHNIQUE: Both transabdominal and transvaginal ultrasound examinations of the pelvis were performed. Transabdominal technique was performed for global imaging of the pelvis including uterus, ovaries, adnexal regions, and pelvic cul-de-sac.  It was necessary to proceed with endovaginal exam following the transabdominal exam to visualize the ovaries. Color and duplex  Doppler ultrasound was utilized to evaluate blood flow to the ovaries.  COMPARISON:  None.  FINDINGS: Uterus  Removed.  Right ovary  Measurements: 3.0 x 3.0 x 2.8 cm. Normal appearance/no adnexal mass.  Left ovary  Measurements: 3.2 x 2.2 x 2.3 cm. Normal appearance/no adnexal mass.  Pulsed Doppler evaluation of both ovaries demonstrates normal low-resistance arterial and venous waveforms.  Other findings  Trace free pelvic fluid is identified.  IMPRESSION: Negative for ovarian torsion. No acute finding. Status post hysterectomy.   Electronically Signed   By: Inge Rise M.D.   On: 02/06/2013 21:28    EKG Interpretation   None       MDM   1. BV (bacterial vaginosis)   2. Abdominal pain   3. Vaginal discharge   4. Elevated glucose   5. Lower back pain    Pt with low back pain and abdominal pain.  Recently evaluated in the ED for similar complaints on 02/01/2013. PE shows RLQ tenderness, and low back pain recreated with palpation of bilateral SI joints. CT without obstruction of kidney, no other identifiable correlating factors for abdominal pain.  Discussed patient history, condition, and labs with Dr. Mingo Amber.  After his evaluation of the patient suggest GU exam for possible PID. GU exam reveals copious amount of white discharge.  Wet prep shows Clue cells. UA shoes Ca oxalate crystals, moderate leukocytes and rare bacteria.  US shows trace free pelvic fluid, without abscess. Will treat for Musculoskeletal back pain,  BV, and cervicitis. Discussed lab results, imaging results, and treatment plan with the patient. Return precautions given. Reports understanding and no other concerns at this time.  Patient is stable for discharge at this time.  Meds given in ED:  Medications  ondansetron (ZOFRAN) injection 4 mg (4 mg Intravenous Given 02/06/13 1835)  morphine 4 MG/ML injection 4 mg (4 mg Intravenous Given 02/06/13 1950)    Discharge Medication List as of 02/06/2013  9:59  PM    START taking  these medications   Details  ibuprofen (ADVIL,MOTRIN) 800 MG tablet Take 1 tablet (800 mg total) by mouth 3 (three) times daily. With meals, Starting 02/06/2013, Until Discontinued, Print          Lorrine Kin, [name] 02/08/13 1323

## 2013-02-06 NOTE — ED Notes (Signed)
Patient reports that she was seen 2 weeks ago for right lower back and right flank pain. Patient reports that she was given Cipro and pain meds, but is not feeling any better. Patient continues to c/o dysuria, but denies any hematuria.

## 2013-02-06 NOTE — Discharge Instructions (Signed)
Call for a follow up appointment with a Family or Primary Care Provider, [facility], Gynecologist, and your urologist.   Return if Symptoms worsen.   Take medication as prescribed.

## 2013-02-07 LAB — GC/CHLAMYDIA PROBE AMP
CT Probe RNA: NEGATIVE
GC Probe RNA: NEGATIVE

## 2013-02-08 NOTE — ED Provider Notes (Signed)
Medical screening examination/treatment/procedure(s) were conducted as a shared visit with non-physician practitioner(s) and myself.  I personally evaluated the patient during the encounter.  EKG Interpretation   None       Patient here with belly pain. Seen recently and had normal CT for similar belly pain. She does have lower quadrant tenderness. She's also complaining of vaginal discharge. She denies sexual activity for several years. Jiles Crocker, pelvic exam with extreme vaginal discharge. Doubt PID as she denies sexual activity. Ultrasound shows no abscess, trace free fluid. Will treat for cervicitis. Stable for discharge.  Osvaldo Shipper, MD 02/08/13 [phone]

## 2013-02-09 ENCOUNTER — Other Ambulatory Visit: Payer: Self-pay | Admitting: Internal Medicine

## 2013-02-16 IMAGING — CR DG ABDOMEN ACUTE W/ 1V CHEST
3 series · 3 of 3 positions shown · non-contrast
Comparison: 05/13/2011

CLINICAL DATA: Hernia repair.  Abdominal pain.

ACUTE ABDOMEN SERIES (ABDOMEN 2 VIEW & CHEST 1 VIEW)

[w chest pa]
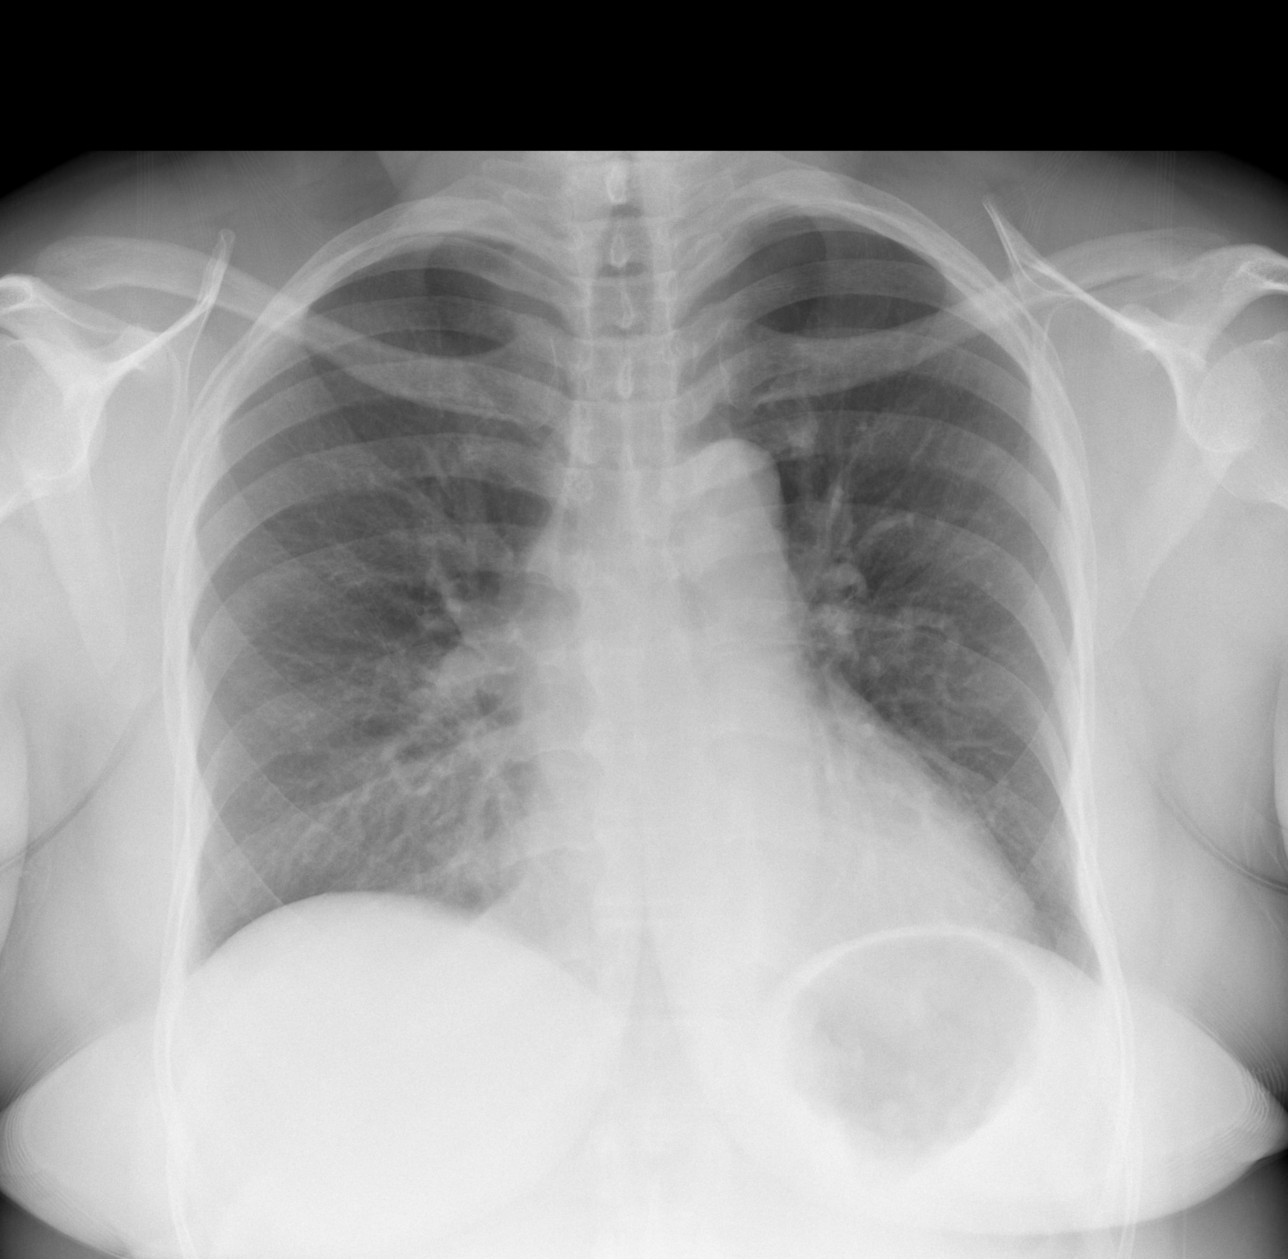

[w abdomen upright]
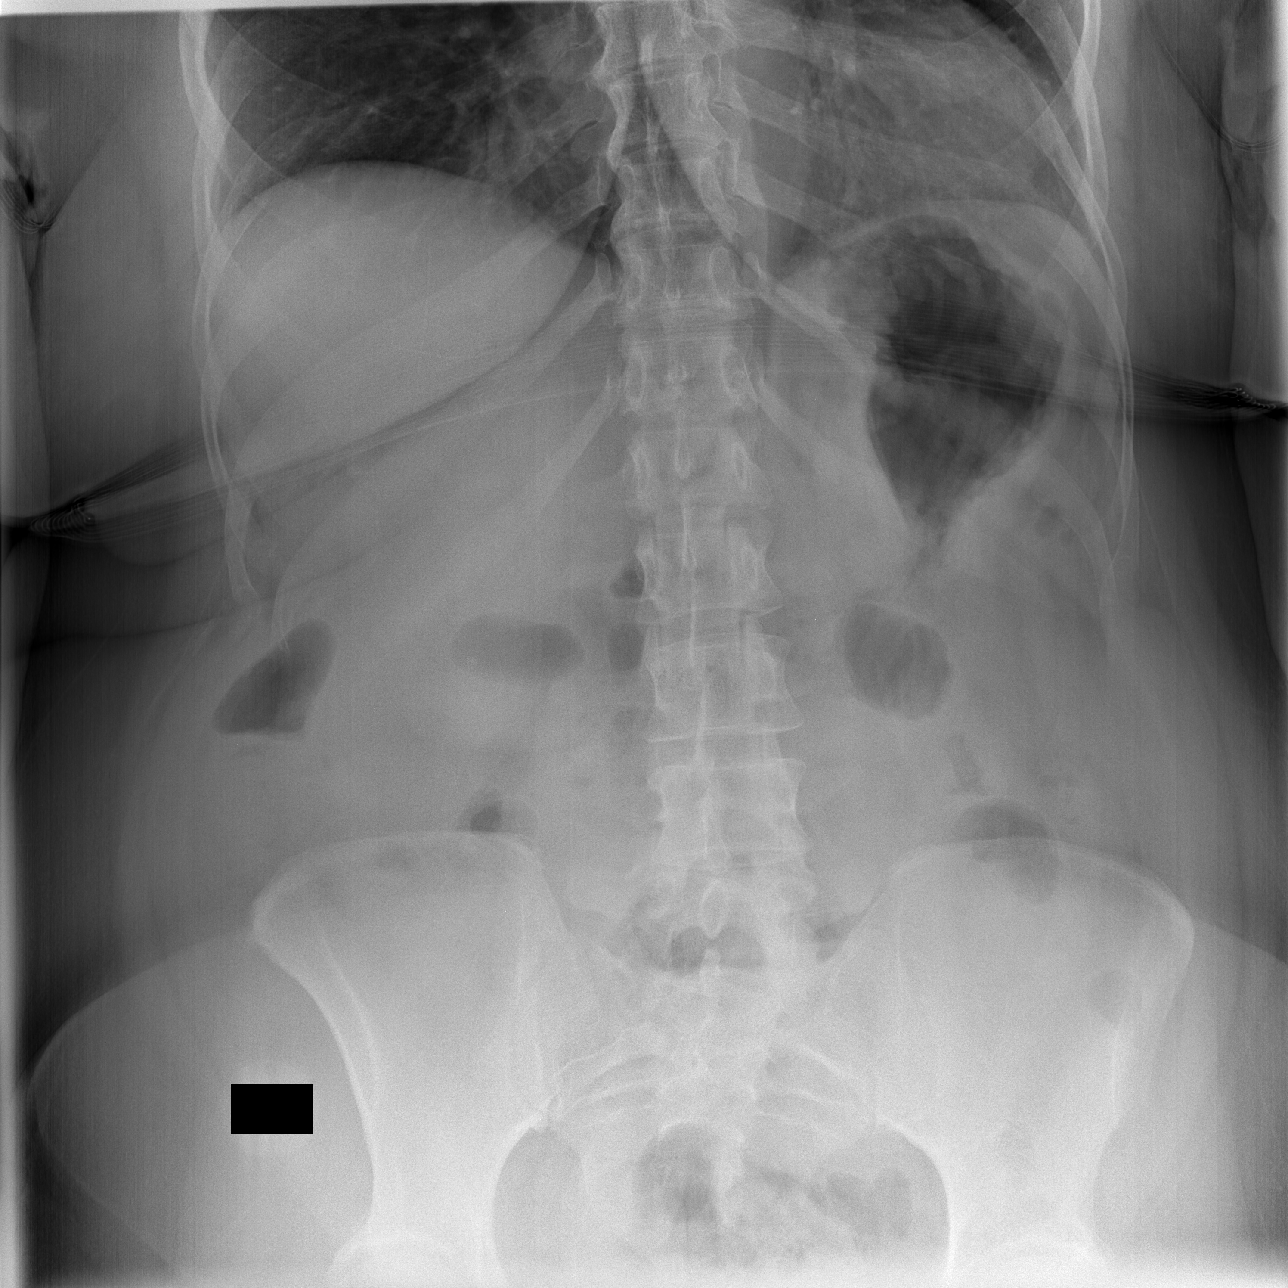

[t abdomen supine]
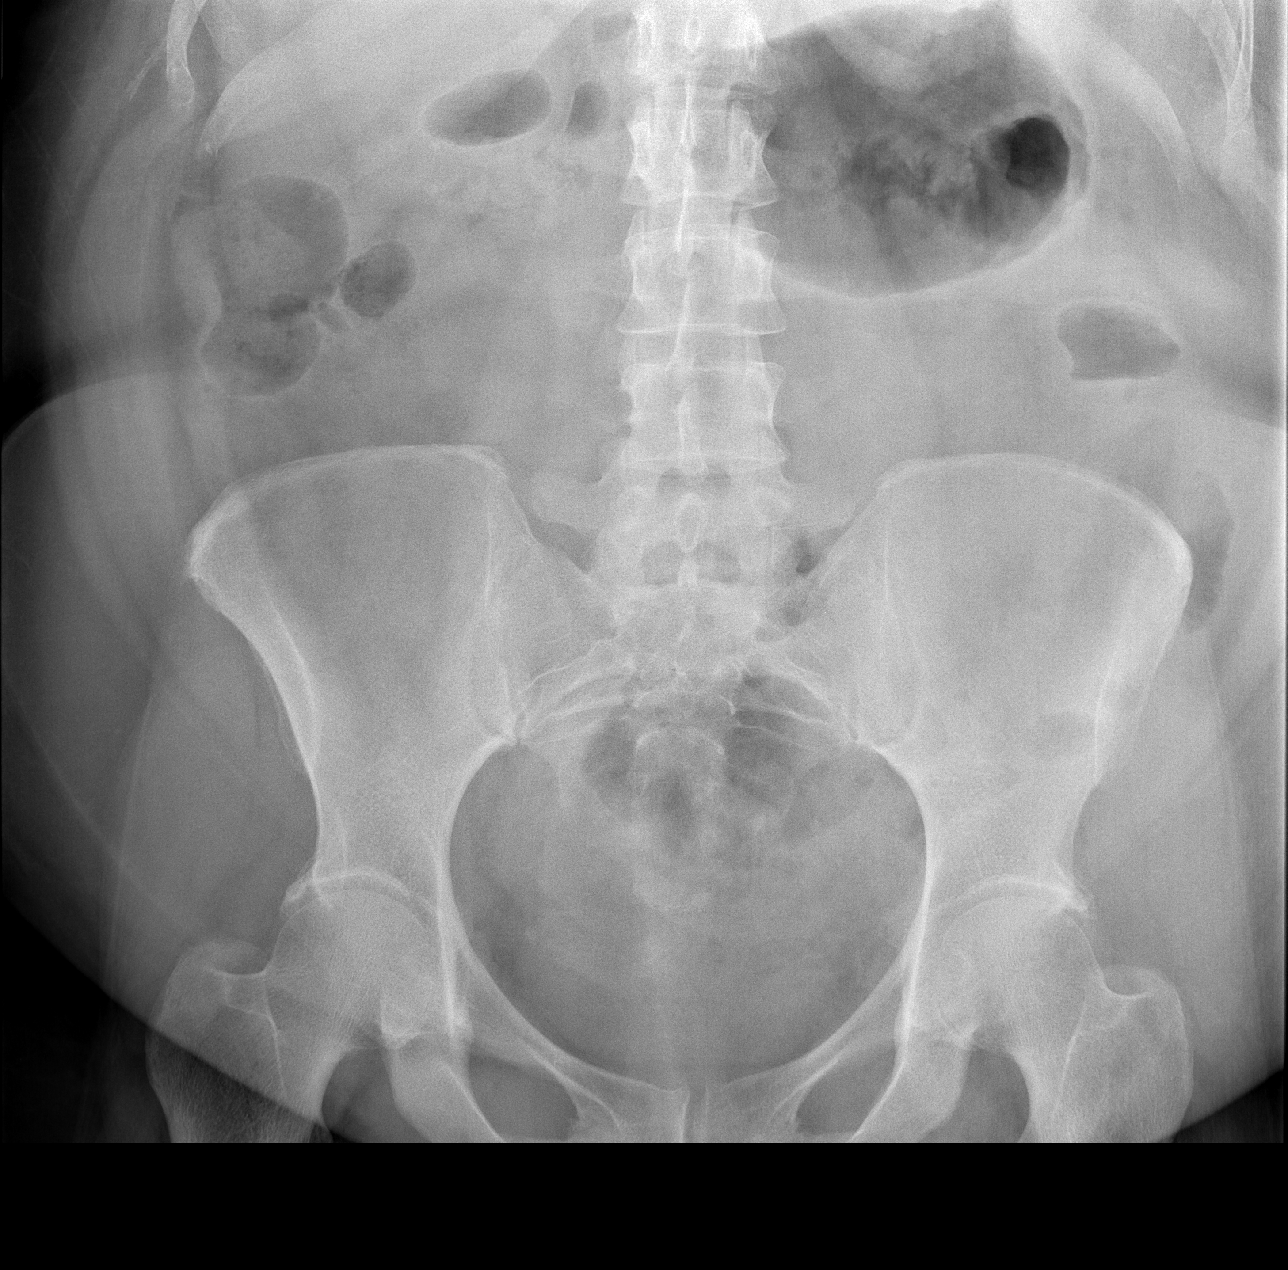

[3 of 3 positions shown; findings below may reference images not displayed]

FINDINGS: Heart size and mediastinal contours are normal.

There is no pleural effusion or pulmonary edema.

There is no airspace consolidation identified.

A few dilated loops of small bowel are noted within the left
abdomen.  These measure up to 3.3 mm.  Some gas and stool noted
within the colon.
IMPRESSION: 1.  No specific features to suggest high-grade bowel obstruction.
When compared with 05/13/2011 the degree of small bowel distention
has slightly improved.

## 2013-02-22 ENCOUNTER — Ambulatory Visit (HOSPITAL_BASED_OUTPATIENT_CLINIC_OR_DEPARTMENT_OTHER): Payer: PRIVATE HEALTH INSURANCE | Attending: Anesthesiology | Admitting: Radiology

## 2013-02-22 VITALS — Ht 67.0 in | Wt 240.0 lb

## 2013-02-22 DIAGNOSIS — J3489 Other specified disorders of nose and nasal sinuses: Secondary | ICD-10-CM | POA: Insufficient documentation

## 2013-02-22 DIAGNOSIS — G4733 Obstructive sleep apnea (adult) (pediatric): Secondary | ICD-10-CM | POA: Insufficient documentation

## 2013-02-22 DIAGNOSIS — R0683 Snoring: Secondary | ICD-10-CM

## 2013-02-23 IMAGING — CT CT ABD-PELV W/ CM
2 of 5 series · 17 of 46 positions shown, 19 images · IV contrast (water/omni  & 100ml omni 300)
Comparison: 05/12/2011

CLINICAL DATA: Nausea, vomiting, recent hernia surgery.

CT ABDOMEN AND PELVIS WITH CONTRAST
TECHNIQUE: Multidetector CT imaging of the abdomen and pelvis was
performed following the standard protocol during bolus
administration of intravenous contrast.
Contrast: 100mL OMNIPAQUE IOHEXOL 300 MG/ML  SOLN

[Series 2: routine abdomen · axial · 0.98mm/px · z∈[-471,-101]mm · 14 of 84 slices shown, 16 images]
[im 5/84  soft-tissue]
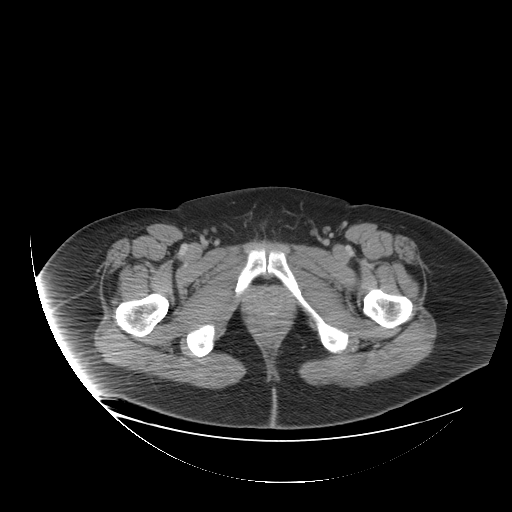
[im 5/84  bone]
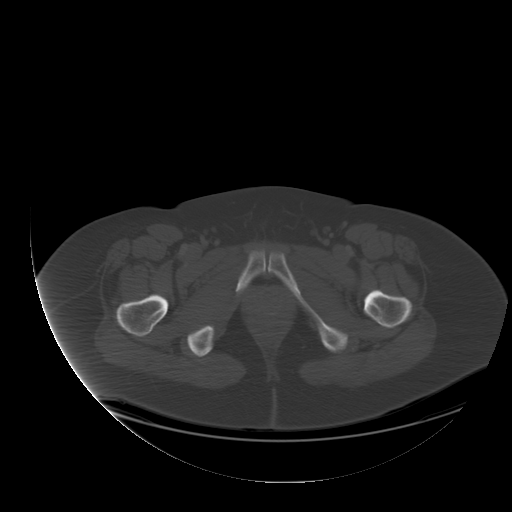
[im 10/84  soft-tissue]
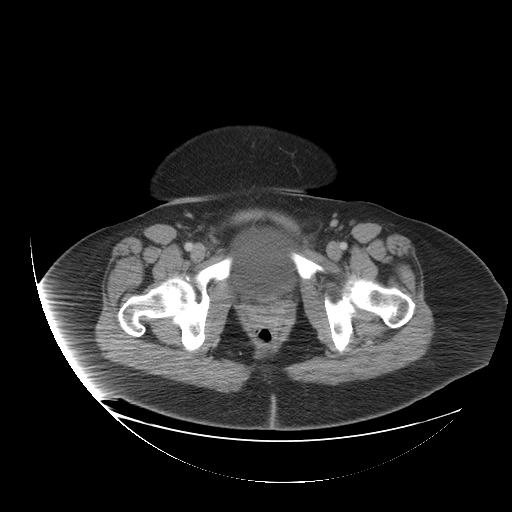
[im 19/84  soft-tissue]
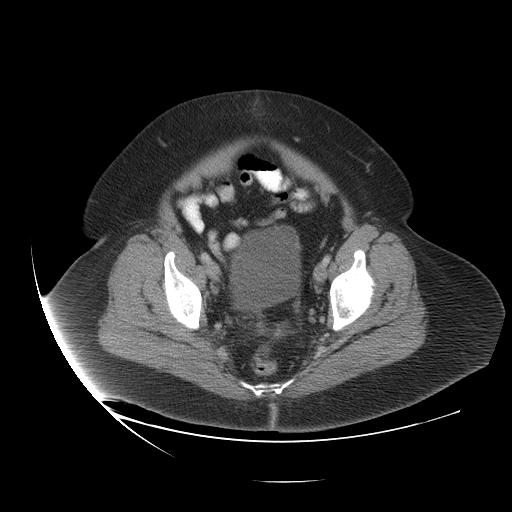
[im 24/84  soft-tissue]
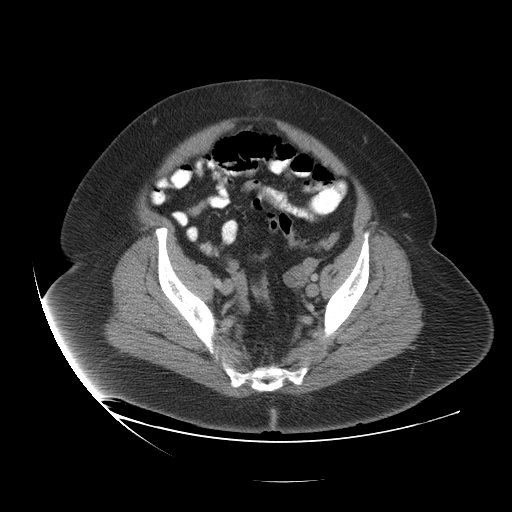
[im 28/84  soft-tissue]
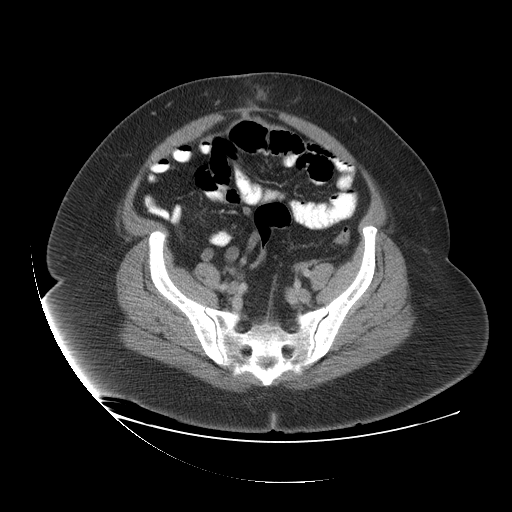
[im 33/84  soft-tissue]
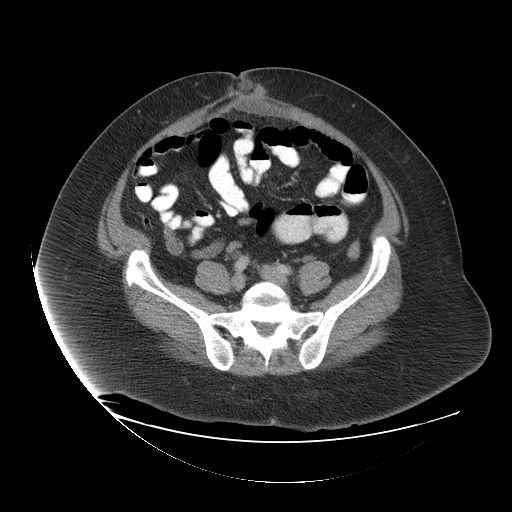
[im 37/84  soft-tissue]
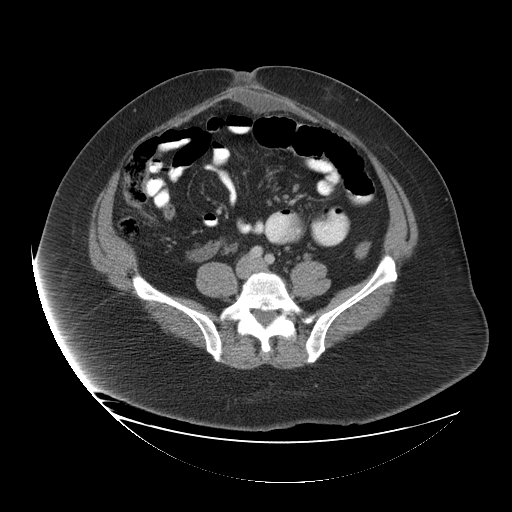
[im 47/84  soft-tissue]
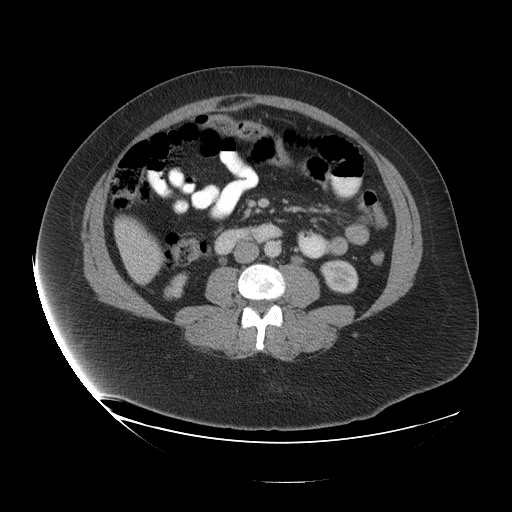
[im 51/84  soft-tissue]
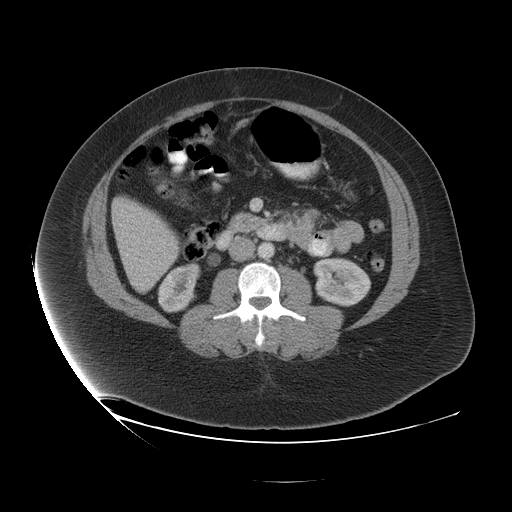
[im 51/84  bone]
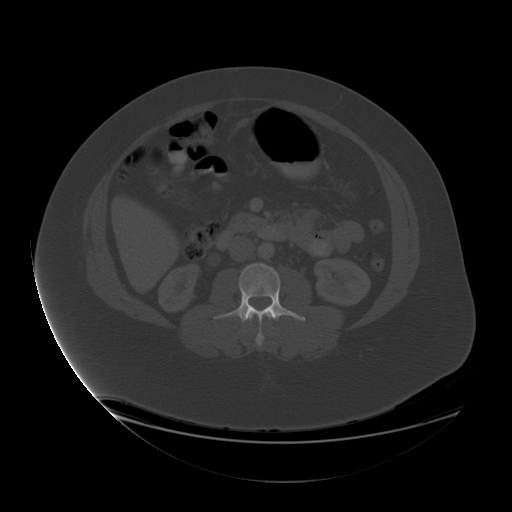
[im 56/84  soft-tissue]
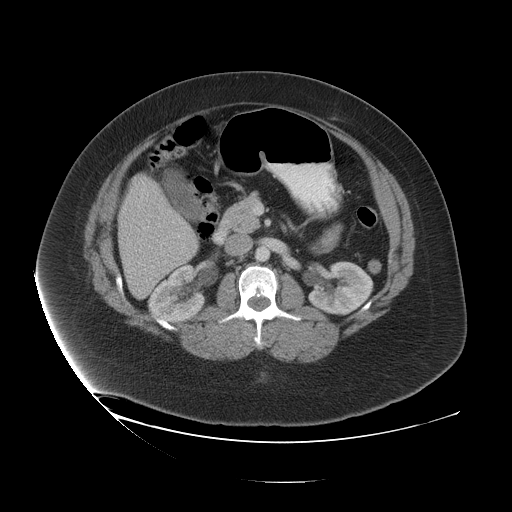
[im 60/84  soft-tissue]
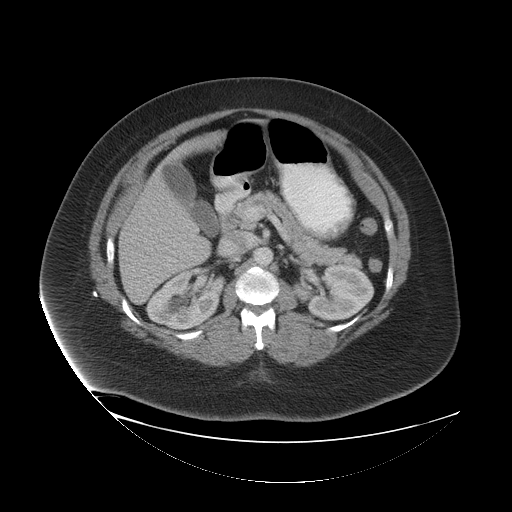
[im 65/84  soft-tissue]
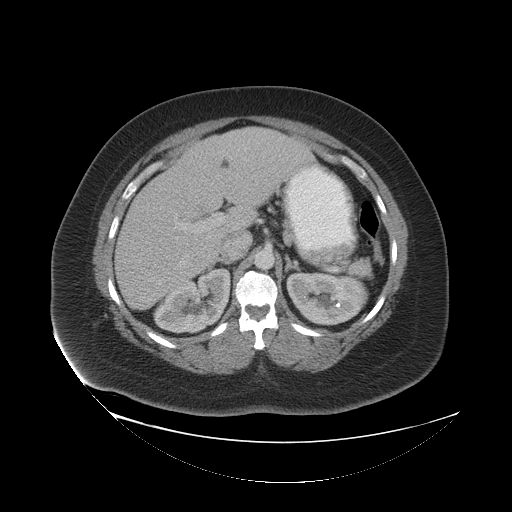
[im 74/84  soft-tissue]
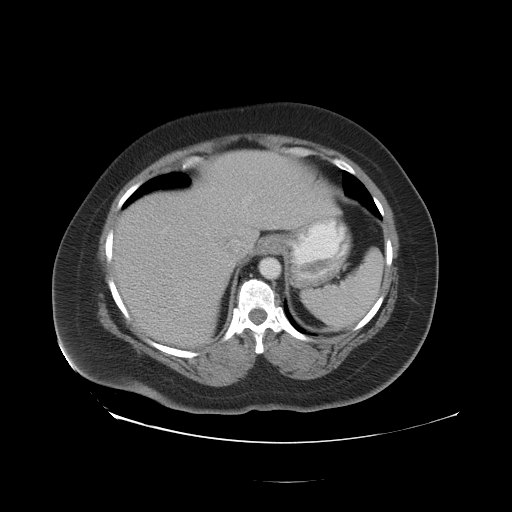
[im 79/84  soft-tissue]
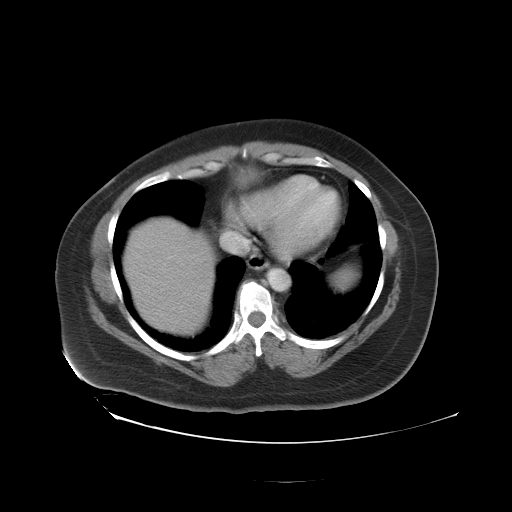

[Series 401: coronal · coronal · 0.98mm/px · 3 of 126 slices shown]
[im 42/126  soft-tissue]
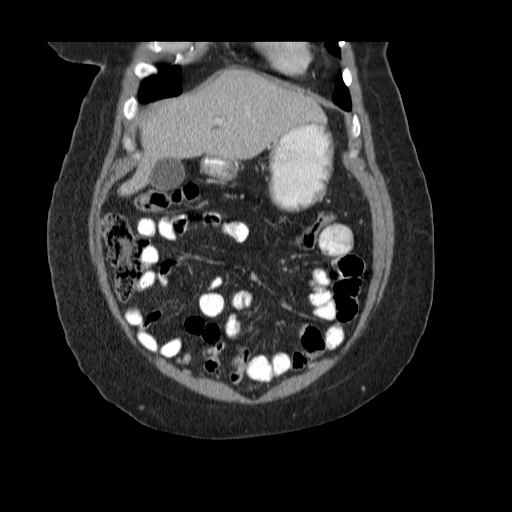
[im 56/126  soft-tissue]
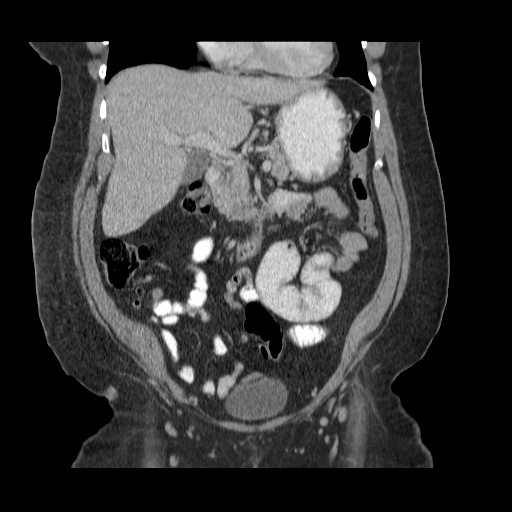
[im 70/126  soft-tissue]
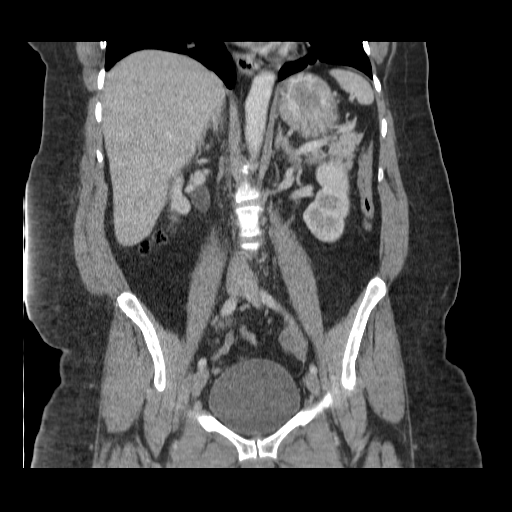

[17 of 46 positions shown; findings below may reference images not displayed]

FINDINGS: Minimal dependent atelectasis in the visualized lung
bases.  Unremarkable liver, gallbladder, spleen, left adrenal
gland, pancreas.  Stable 2 cm low attenuation right adrenal nodule.
Bilateral nephrolithiasis, largest stone on the right 3 mm in the
lower pole, on the left 5 mm in the upper pole.  No hydronephrosis
or ureteral calculus.  Urinary bladder is incompletely distended,
unremarkable.

There has been some decrease in the amount of fluid adjacent to the
umbilical hernia mesh, largest pocket measuring 20 x 54 mm maximal
transverse dimensions.  No new fluid collections are identified.
No ascites.  Stomach and small bowel are nondilated.  Normal
appendix.  Colon is nondilated.  No adenopathy localized.  Previous
hysterectomy.
IMPRESSION: 1.  Slight interval decrease in loculated fluid adjacent to the
hernia mesh.
2.  Bilateral nephrolithiasis without hydronephrosis or ureteral
calculus.

## 2013-02-26 DIAGNOSIS — R0989 Other specified symptoms and signs involving the circulatory and respiratory systems: Secondary | ICD-10-CM

## 2013-02-26 DIAGNOSIS — G4733 Obstructive sleep apnea (adult) (pediatric): Secondary | ICD-10-CM

## 2013-02-26 DIAGNOSIS — R0609 Other forms of dyspnea: Secondary | ICD-10-CM

## 2013-02-26 NOTE — Sleep Study (Signed)
   NAME: [name] DATE OF BIRTH:  1965-08-25 MEDICAL RECORD NUMBER [id number]  LOCATION: [facility]  PHYSICIAN: Corynn Solberg D  DATE OF STUDY: 02/22/2013  SLEEP STUDY TYPE: Nocturnal Polysomnogram               REFERRING PHYSICIAN: Shanon Ace,*  INDICATION FOR STUDY: Hypersomnia with sleep apnea  EPWORTH SLEEPINESS SCORE:   21/24 HEIGHT: 5' 7"  (170.2 cm)  WEIGHT: 240 lb (108.863 kg)    Body mass index is 37.58 kg/(m^2).  NECK SIZE: 16.5 in.  MEDICATIONS: Charted for review  SLEEP ARCHITECTURE: Total sleep time 349.5 minutes with sleep efficiency 95.2%. Stage I was 6%, stage II 66%, stage III absent, REM 28% of total sleep time. Sleep latency 11 minutes, REM latency 127.5 minutes, awake after sleep onset 4 minutes, arousal index 6.9. Bedtime medication: Doxycycline, Neurontin, ibuprofen, verapamil, levamir, trazodone, Ambien  RESPIRATORY DATA: Apnea hypopnea index (AHI) 6 per hour. 35 events were scored including 22 obstructive apneas and 13 hypopneas. Events were not positional. REM AHI 20.2 per hour. There were not enough early events to meet protocol requirements for split CPAP titration grade  OXYGEN DATA:  Moderate to loud snoring with oxygen desaturation to a nadir of 81% and mean oxygen saturation to the study of 94.1% on room air.  CARDIAC DATA: Sinus rhythm with PACs  MOVEMENT/PARASOMNIA: No significant movement disturbance. No bathroom trips  IMPRESSION/ RECOMMENDATION:   1) Sleep architecture showed sleep well maintained, with somewhat higher percentage time spent in rem than anticipated. Note sedating bedtime medications trazodone and Ambien.  2) Mild obstructive sleep apnea/hypopneas syndrome, AHI 6 per hour with non-positional events. REM AHI 20.2. Moderate to loud snoring with oxygen desaturation to a nadir of 81% and mean oxygen saturation to the study of 94.1% on room air. 3) There were not enough events to qualify for split  protocol CPAP titration. Scores and this range might respond to weight loss, treatment for nasal congestion, or an oral appliance if appropriate, while CPAP remains an option if necessary.  Signed Baird Lyons M.D. Deneise Lever Diplomate, American Board of Sleep Medicine  ELECTRONICALLY SIGNED ON:  02/26/2013, 2:57 PM [place] PH: [phone]   FX: [phone] [place]

## 2013-03-03 ENCOUNTER — Encounter: Payer: Self-pay | Admitting: Nurse Practitioner

## 2013-03-09 ENCOUNTER — Ambulatory Visit: Payer: Self-pay | Admitting: Nurse Practitioner

## 2013-03-09 ENCOUNTER — Other Ambulatory Visit: Payer: Self-pay | Admitting: Internal Medicine

## 2013-03-09 ENCOUNTER — Encounter: Payer: Self-pay | Admitting: Obstetrics & Gynecology

## 2013-04-05 ENCOUNTER — Encounter (INDEPENDENT_AMBULATORY_CARE_PROVIDER_SITE_OTHER): Payer: Self-pay

## 2013-04-05 ENCOUNTER — Ambulatory Visit (INDEPENDENT_AMBULATORY_CARE_PROVIDER_SITE_OTHER): Payer: 59 | Admitting: Psychiatry

## 2013-04-05 VITALS — BP 124/86 | HR 84 | Ht 67.0 in | Wt 250.4 lb

## 2013-04-05 DIAGNOSIS — F332 Major depressive disorder, recurrent severe without psychotic features: Secondary | ICD-10-CM

## 2013-04-05 MED ORDER — FLUOXETINE HCL 20 MG PO CAPS: 60.0000 mg | ORAL_CAPSULE | Freq: Every day | ORAL | Status: DC

## 2013-04-05 MED ORDER — TRAZODONE HCL 150 MG PO TABS: 150.0000 mg | ORAL_TABLET | Freq: Every day | ORAL | Status: DC

## 2013-04-05 MED ORDER — ZOLPIDEM TARTRATE 5 MG PO TABS: 5.0000 mg | ORAL_TABLET | Freq: Every evening | ORAL | Status: DC | PRN

## 2013-04-05 NOTE — Progress Notes (Signed)
Psychiatric Assessment Adult  Patient Identification:  [name] Date of Evaluation:  04/05/2013 Chief Complaint: Feeling okay This patient is a 48 year old African American mother who presently lives in [place] her daughter the last year. The patient was living in an assisted-living center after having for strokes. The patient now is medically stable and well enough to live back into the community. The patient has been diagnosed with clinical depression and takes a relatively high dose of Prozac 60 mg. She takes a few sleeping aids but other than that takes little psychiatric medicine. On the other hand she has multiple medical problems. She takes well over a dozen medications for her medical conditions are hypertension, diabetes coronary artery disease strokes and seizures. Her last seizure was a few months ago. Today the patient is medically fairly stable with few symptoms. She denies shortness of breath or chest pain. The patient has been married for 7 years and a good marriage. She has 3 stepchildren 2 biological children and 13 grandchildren. She claims all her children and grandchildren are well. The patient is on disability for her medical conditions. Patient denies daily depression. She is sleeping well on 2 medications. She's got a good appetite is reasonably good energy. She can think and concentrate without problems. She enjoys going to church reading the Bible sitting to music watching TV. At this time the patient is not suicidal but she made 2 suicide attempts the last one in 1990. This patient denies the use of alcohol or drugs. She has no symptoms of psychosis. In 2011 she had a period where she was persistently depressed but claims her sleeping and eating well and has little other problems. The psychiatrist caring for her in the assisted-living insisted that she be admitted to the [facility] psychiatric facility. This represented the third of 2 psychiatric hospitalizations in her  life. The patient is a 2 year college education. She is from [place]. She was brought up by a great grandmother . The patient experienced no sexual or physical abuse as a child. Today the patient was seen alone but her husband came with her. The patient does not smoke cigarettes. At this time the patient is very stable. History of Chief Complaint:  No chief complaint on file.   HPI Review of Systems Physical Exam  Depressive Symptoms: fatigue,  (Hypo) Manic Symptoms:   Elevated Mood:  No Irritable Mood:  No Grandiosity:  No Distractibility:  No Labiality of Mood:  No Delusions:  No Hallucinations:  No Impulsivity:  No Sexually Inappropriate Behavior:  No Financial Extravagance:   Flight of Ideas:    Anxiety Symptoms: Excessive Worry:  No Panic Symptoms:  No Agoraphobia:  No Obsessive Compulsive: No  Symptoms: None, Specific Phobias:  No Social Anxiety:  No  Psychotic Symptoms:  Hallucinations: No None Delusions:  No Paranoia:  No   Ideas of Reference:  No  PTSD Symptoms: Ever had a traumatic exposure:  No Had a traumatic exposure in the last month:   Re-experiencing:   Hypervigilance:   Hyperarousal:   Avoidance:    Traumatic Brain Injury: Yes Blunt Trauma  Past Psychiatric History: Diagnosis: Major depression   Hospitalizations: 3   Outpatient Care:   Substance Abuse Care:   Self-Mutilation:   Suicidal Attempts: 2  Violent Behaviors:    Past Medical History:   Past Medical History  Diagnosis Date  . Hypertension   . Asthma   . Seizures   . Heart murmur     born  with   . Dysrhythmia     skipped beats  . Myocardial infarction     hi of x 2  last one 2003   . COPD (chronic obstructive pulmonary disease)     chronic bronchitis   . Pneumonia     hx of 2009  . GERD (gastroesophageal reflux disease)   . Headache(784.0)     migraines   . Neuropathy     due to diabetes   . Dizziness     secondary to diabetes and hypertension   . Arthritis      joint pain   . Hx of blood clots     hematomas removed from left side of brain from 57mo to 51yrold   . Epilepsy idiopathic petit mal     last seizure 2012;controlled w/ topomax  . Diabetic neuropathy, painful     FEET AND HANDS  . Diabetes mellitus     since age [age]type 2 IDDM  . Sleep apnea     sleep study 2010 @ [facility];does not use Cpap ; mild  . MIGRAINE HEADACHE 09/14/2006    Qualifier: Diagnosis of  By: MaHassell DoneNP, NyTori Milks  . Glaucoma     NOT ON ANY EYE DROPS   . Legally blind     left eye   . Stroke     last 2003  RESIDUAL LEFT LEG WEAKNESS--NO OTHER RESIDUAL PROBLEMS  . CVA 11/14/2007    Qualifier: Diagnosis of  By: MaHassell DoneNP, NyTori Milks  . Pain 09/23/11    LOWER BACK AND RIGHT SIDE--PT HAS RIGHT URETERAL STONE  . Nephrolithiasis     frequent urination , urination at nite  PT SEEN IN ER 09/22/11 FOR BACK AND RT SIDED PAIN--HAS KNOWN STONE RT URETER AND UA IN ER SHOWED UTI   History of Loss of Consciousness:  No Seizure History:  Yes Cardiac History:  Yes Allergies:   Allergies  Allergen Reactions  . Codeine Swelling and Other (See Comments)    Swelling and burning of mouth (inside)  . Lactose Intolerance (Gi)    Current Medications:  Current Outpatient Prescriptions  Medication Sig Dispense Refill  . albuterol (PROVENTIL HFA;VENTOLIN HFA) 108 (90 BASE) MCG/ACT inhaler Inhale 2 puffs into the lungs every 6 (six) hours as needed. Wheezing      . baclofen (LIORESAL) 10 MG tablet TAKE 1 TABLET BY MOUTH THREE TIMES A DAY  90 tablet  0  . budesonide-formoterol (SYMBICORT) 160-4.5 MCG/ACT inhaler Inhale 2 puffs into the lungs 2 (two) times daily.      . clopidogrel (PLAVIX) 75 MG tablet Take 1 tablet (75 mg total) by mouth daily.  30 tablet  6  . diclofenac (VOLTAREN) 75 MG EC tablet TAKE 1 TABLET BY MOUTH TWICE DAILY WITH FOOD AS NEEDED  20 tablet  0  . doxycycline (VIBRAMYCIN) 100 MG capsule Take 1 capsule (100 mg total) by mouth 2 (two) times daily. With  meals  14 capsule  0  . FLUoxetine (PROZAC) 20 MG capsule Take 3 capsules (60 mg total) by mouth daily at 12 noon.  90 capsule  5  . fluticasone (FLONASE) 50 MCG/ACT nasal spray USE 2 SPRAYS IN EACH NOSTRIL EVERY DAY  16 g  0  . gabapentin (NEURONTIN) 300 MG capsule TAKE 2 CAPSULE BY MOUTH THREE TIMES A DAY  180 capsule  0  . GLIPIZIDE XL 10 MG 24 hr tablet TAKE 2 TABLETS BY MOUTH DAILY  60 tablet  0  .  HYDROcodone-acetaminophen (NORCO/VICODIN) 5-325 MG per tablet Take 2 tablets by mouth every 4 (four) hours as needed.  10 tablet  0  . ibuprofen (ADVIL,MOTRIN) 800 MG tablet Take 1 tablet (800 mg total) by mouth 3 (three) times daily. With meals  30 tablet  0  . insulin detemir (LEVEMIR) 100 UNIT/ML injection Inject 30-40 Units into the skin 2 (two) times daily. 30 units every morning and 40 units every night.      . metroNIDAZOLE (FLAGYL) 500 MG tablet Take 1 tablet (500 mg total) by mouth 2 (two) times daily.  14 tablet  0  . omeprazole (PRILOSEC) 20 MG capsule Take 40 mg by mouth daily.      . ondansetron (ZOFRAN ODT) 4 MG disintegrating tablet Take 1 tablet (4 mg total) by mouth every 8 (eight) hours as needed for nausea.  6 tablet  0  . ondansetron (ZOFRAN) 8 MG tablet Take 8 mg by mouth every 6 (six) hours. PT STATES Nausea FROM PREVIOUS HERNIA REPAIR AND FROM KIDNEY STONE ---TAKES ZOFRAN  AT 8AM AND 12 NOON AND 8 PM  EVERY DAY      . ondansetron (ZOFRAN-ODT) 8 MG disintegrating tablet DISSOLVE 1 TABLET UNDER THE TONGUE EVERY 6 HOURS AS NEEDED  12 tablet  0  . polyethylene glycol (MIRALAX / GLYCOLAX) packet Take 17 g by mouth daily as needed. Constipation.      . promethazine (PHENERGAN) 25 MG tablet Take 1 tablet (25 mg total) by mouth every 6 (six) hours as needed for nausea.  20 tablet  0  . simvastatin (ZOCOR) 20 MG tablet Take 20 mg by mouth daily. TAKES IN AM      . Tamsulosin HCl (FLOMAX) 0.4 MG CAPS Take 0.4 mg by mouth daily after breakfast.      . topiramate (TOPAMAX) 200 MG tablet  TAKE 1 TABLET BY MOUTH TWICE DAILY  60 tablet  0  . traZODone (DESYREL) 150 MG tablet Take 1 tablet (150 mg total) by mouth at bedtime.  30 tablet  5  . verapamil (VERELAN PM) 240 MG 24 hr capsule TAKE 1 CAPSULE BY MOUTH EVERY NIGHT AT BEDTIME  30 capsule  0  . Vitamin D, Ergocalciferol, (DRISDOL) 50000 UNITS CAPS capsule Take 50,000 Units by mouth every 7 (seven) days. Thursday      . zolpidem (AMBIEN) 5 MG tablet Take 1 tablet (5 mg total) by mouth at bedtime as needed for sleep.  30 tablet  5   No current facility-administered medications for this visit.    Previous Psychotropic Medications:  Medication Dose  Prozac  60 mg                      Substance Abuse History in the last 12 months: Substance Age of 1st Use Last Use Amount Specific Type    Legal Consequences of Substance Abuse:   Family Consequences of Substance Abuse:   Blackouts:   DT's:   Withdrawal Symptoms:     Social History: Current Place of Residence: [employer] [place] of Birth:  Family Members:  Marital Status:  Married Children: 5  Sons:   Daughters:  Relationships:  Education:  Dentist Problems/Performance:  Religious Beliefs/Practices:  History of Abuse:  Ship broker History:   Legal History:  Hobbies/Interests:   Family History:   Family History  Problem Relation Age of Onset  . Cancer Mother     uterine  . Cancer Maternal Grandmother     brain  .  Anesthesia problems Neg Hx     Mental Status Examination/Evaluation: Objective:  Appearance: Meticulous  Eye Contact::  Good  Speech:  Clear and Coherent  Volume:  Normal  Mood:  good  Affect:  Appropriate  Thought Process:  Coherent  Orientation:  Full (Time, Place, and Person)  Thought Content:  WDL  Suicidal Thoughts:  No  Homicidal Thoughts:  No  Judgement:  Good  Insight:  Good  Psychomotor Activity:  Normal  Akathisia:  No  Handed:  Right  AIMS (if indicated):    Assets:  Desire for  Improvement    Laboratory/X-Ray Psychological Evaluation(s)        Assessment:  Axis I: Major Depression, Recurrent severe  AXIS I Major Depression, Recurrent severe  AXIS II Deferred  AXIS III Past Medical History  Diagnosis Date  . Hypertension   . Asthma   . Seizures   . Heart murmur     born with   . Dysrhythmia     skipped beats  . Myocardial infarction     hi of x 2  last one 2003   . COPD (chronic obstructive pulmonary disease)     chronic bronchitis   . Pneumonia     hx of 2009  . GERD (gastroesophageal reflux disease)   . Headache(784.0)     migraines   . Neuropathy     due to diabetes   . Dizziness     secondary to diabetes and hypertension   . Arthritis     joint pain   . Hx of blood clots     hematomas removed from left side of brain from 47mo to 89yrold   . Epilepsy idiopathic petit mal     last seizure 2012;controlled w/ topomax  . Diabetic neuropathy, painful     FEET AND HANDS  . Diabetes mellitus     since age [age]type 2 IDDM  . Sleep apnea     sleep study 2010 @ [facility];does not use Cpap ; mild  . MIGRAINE HEADACHE 09/14/2006    Qualifier: Diagnosis of  By: MaHassell DoneNP, NyTori Milks  . Glaucoma     NOT ON ANY EYE DROPS   . Legally blind     left eye   . Stroke     last 2003  RESIDUAL LEFT LEG WEAKNESS--NO OTHER RESIDUAL PROBLEMS  . CVA 11/14/2007    Qualifier: Diagnosis of  By: MaHassell DoneNP, NyTori Milks  . Pain 09/23/11    LOWER BACK AND RIGHT SIDE--PT HAS RIGHT URETERAL STONE  . Nephrolithiasis     frequent urination , urination at nite  PT SEEN IN ER 09/22/11 FOR BACK AND RT SIDED PAIN--HAS KNOWN STONE RT URETER AND UA IN ER SHOWED UTI     AXIS IV educational problems  AXIS V 61-70 mild symptoms   Treatment Plan/Recommendations:  Plan of Care: At this time the patient continue taking Prozac 20 mg 3 at noon. He also continue trazodone 150 mg at night and she will continue Ambien 5 mg all for sleep. At this time the patient is not in  therapy and it does not seem indicated. The patient is used to being seen every 2-3 months in supportive psychotherapy during med checks. Will offer the same care here.   Laboratory:    Psychotherapy:   Medications: Prozac 20 mg 3 at noon   Routine PRN Medications:    Consultations:   Safety Concerns:    Other:  Haskel Schroeder, MD 3/18/20152:04 PM

## 2013-04-13 ENCOUNTER — Encounter: Payer: Self-pay | Admitting: Nurse Practitioner

## 2013-04-13 ENCOUNTER — Ambulatory Visit (INDEPENDENT_AMBULATORY_CARE_PROVIDER_SITE_OTHER): Payer: PRIVATE HEALTH INSURANCE | Admitting: Nurse Practitioner

## 2013-04-13 ENCOUNTER — Other Ambulatory Visit: Payer: Self-pay | Admitting: Nurse Practitioner

## 2013-04-13 VITALS — BP 140/90 | HR 82 | Resp 10 | Ht 66.5 in | Wt 246.0 lb

## 2013-04-13 DIAGNOSIS — F341 Dysthymic disorder: Secondary | ICD-10-CM

## 2013-04-13 DIAGNOSIS — K219 Gastro-esophageal reflux disease without esophagitis: Secondary | ICD-10-CM

## 2013-04-13 DIAGNOSIS — F329 Major depressive disorder, single episode, unspecified: Secondary | ICD-10-CM

## 2013-04-13 DIAGNOSIS — R110 Nausea: Secondary | ICD-10-CM

## 2013-04-13 DIAGNOSIS — G609 Hereditary and idiopathic neuropathy, unspecified: Secondary | ICD-10-CM

## 2013-04-13 DIAGNOSIS — E785 Hyperlipidemia, unspecified: Secondary | ICD-10-CM

## 2013-04-13 DIAGNOSIS — G4700 Insomnia, unspecified: Secondary | ICD-10-CM

## 2013-04-13 DIAGNOSIS — N3946 Mixed incontinence: Secondary | ICD-10-CM

## 2013-04-13 DIAGNOSIS — I252 Old myocardial infarction: Secondary | ICD-10-CM

## 2013-04-13 DIAGNOSIS — I10 Essential (primary) hypertension: Secondary | ICD-10-CM

## 2013-04-13 DIAGNOSIS — F32A Depression, unspecified: Secondary | ICD-10-CM

## 2013-04-13 DIAGNOSIS — J45909 Unspecified asthma, uncomplicated: Secondary | ICD-10-CM

## 2013-04-13 DIAGNOSIS — E119 Type 2 diabetes mellitus without complications: Secondary | ICD-10-CM

## 2013-04-13 DIAGNOSIS — R569 Unspecified convulsions: Secondary | ICD-10-CM

## 2013-04-13 DIAGNOSIS — F419 Anxiety disorder, unspecified: Secondary | ICD-10-CM

## 2013-04-13 DIAGNOSIS — G894 Chronic pain syndrome: Secondary | ICD-10-CM

## 2013-04-13 MED ORDER — RANITIDINE HCL 150 MG PO TABS: 150.0000 mg | ORAL_TABLET | Freq: Two times a day (BID) | ORAL | Status: DC

## 2013-04-13 MED ORDER — GLIPIZIDE ER 10 MG PO TB24: 20.0000 mg | ORAL_TABLET | Freq: Every day | ORAL | Status: DC

## 2013-04-13 MED ORDER — GABAPENTIN 300 MG PO CAPS: ORAL_CAPSULE | ORAL | Status: DC

## 2013-04-13 MED ORDER — SIMVASTATIN 20 MG PO TABS: 20.0000 mg | ORAL_TABLET | Freq: Every day | ORAL | Status: DC

## 2013-04-13 MED ORDER — VERAPAMIL HCL ER 240 MG PO CP24: ORAL_CAPSULE | ORAL | Status: DC

## 2013-04-13 MED ORDER — BUDESONIDE-FORMOTEROL FUMARATE 160-4.5 MCG/ACT IN AERO: 2.0000 | INHALATION_SPRAY | Freq: Two times a day (BID) | RESPIRATORY_TRACT | Status: DC

## 2013-04-13 MED ORDER — BACLOFEN 10 MG PO TABS: ORAL_TABLET | ORAL | Status: DC

## 2013-04-13 MED ORDER — TRAZODONE HCL 150 MG PO TABS
150.0000 mg | ORAL_TABLET | Freq: Every day | ORAL | Status: DC
Start: 2013-04-13 — End: 2013-09-06

## 2013-04-13 MED ORDER — TETANUS-DIPHTH-ACELL PERTUSSIS 5-2.5-18.5 LF-MCG/0.5 IM SUSP: 0.5000 mL | Freq: Once | INTRAMUSCULAR | Status: DC

## 2013-04-13 MED ORDER — ALBUTEROL SULFATE HFA 108 (90 BASE) MCG/ACT IN AERS: 2.0000 | INHALATION_SPRAY | Freq: Four times a day (QID) | RESPIRATORY_TRACT | Status: DC | PRN

## 2013-04-13 MED ORDER — CLOPIDOGREL BISULFATE 75 MG PO TABS: 75.0000 mg | ORAL_TABLET | Freq: Every day | ORAL | Status: DC

## 2013-04-13 MED ORDER — OMEPRAZOLE 20 MG PO CPDR: 20.0000 mg | DELAYED_RELEASE_CAPSULE | Freq: Every day | ORAL | Status: DC

## 2013-04-13 MED ORDER — TOPIRAMATE 200 MG PO TABS: ORAL_TABLET | ORAL | Status: DC

## 2013-04-13 MED ORDER — FLUOXETINE HCL 20 MG PO CAPS: 60.0000 mg | ORAL_CAPSULE | Freq: Every day | ORAL | Status: DC

## 2013-04-13 NOTE — Patient Instructions (Signed)
Make appt for 4 weeks for a complete physical with FASTING blood work before visit   Please write down blood sugars to bring to next visit GOAL: 30 mins of cardiovascular activity 5 days a week; you can break this up and start slow and work your way up to goal

## 2013-04-13 NOTE — Progress Notes (Signed)
Patient ID: [name], female   DOB: 03-23-1965, 49 y.o.   MRN: [id number]    Allergies  Allergen Reactions  . Codeine Swelling and Other (See Comments)    Swelling and burning of mouth (inside)  . Lactose Intolerance (Gi)     Chief Complaint  Patient presents with  . Establish Care    New patient establish care : Renew medications , fasting if any labs due  . Abdominal Pain    Stomach pains off/on x long time (too long to recall)   . Referral    Referral to Neurology for seziure management  and referral to pain management     HPI: Patient is a 48 y.o. female seen in the office today to establish care; was previously being [facility] but due to insurance had to change.  Previously live in [place] and has been in [place] for 1 year; in [place] was going to [facility] and seeing pulmonary, cardiologist, neurologist, urologist, diabetic specialist, ophthalmologist (legal blind in left eye)  and was in the process of getting a referral for GI; when she moved back to [place] she has not been able to find doctors in network so she has not been able to see any of these specialist since she has been here. Needs referrals for all of these specialist.   Review of Systems:  Review of Systems  Constitutional: Positive for weight loss (trying to lose weight ).  HENT: Negative for ear discharge, ear pain, hearing loss, nosebleeds and tinnitus.   Eyes:       Glaucoma  Respiratory: Positive for cough (ongoing) and shortness of breath (frequently due to asthma).        Hx of sleep apnea but does not have machine, sleep study in feb 2015 by [facility]  Cardiovascular: Negative for chest pain, palpitations and leg swelling.  Gastrointestinal: Positive for heartburn, nausea and abdominal pain (chronic; on and off, sometimes worse than others). Negative for diarrhea and constipation.  Genitourinary: Negative for dysuria, urgency and frequency.  Musculoskeletal: Positive  for back pain, joint pain (knees) and myalgias. Negative for falls.  Skin: Negative for itching and rash.  Neurological: Positive for dizziness, tingling, sensory change, seizures (no seizures since 2014), weakness and headaches (migraines ).  Endo/Heme/Allergies: Positive for environmental allergies.  Psychiatric/Behavioral: Positive for depression. Negative for suicidal ideas, hallucinations and substance abuse. The patient is nervous/anxious and has insomnia.      Past Medical History  Diagnosis Date  . Hypertension   . Asthma   . Seizures   . Heart murmur     born with   . Dysrhythmia     skipped beats  . Myocardial infarction     hi of x 2  last one 2003   . COPD (chronic obstructive pulmonary disease)     chronic bronchitis   . Pneumonia     hx of 2009  . GERD (gastroesophageal reflux disease)   . Headache(784.0)     migraines   . Neuropathy     due to diabetes   . Dizziness     secondary to diabetes and hypertension   . Arthritis     joint pain   . Hx of blood clots     hematomas removed from left side of brain from 58mo to 89yrold   . Epilepsy idiopathic petit mal     last seizure 2012;controlled w/ topomax  . Diabetic neuropathy, painful  FEET AND HANDS  . Diabetes mellitus     since age 81; type 2 IDDM  . Sleep apnea     sleep [facility];does not use Cpap ; mild  . MIGRAINE HEADACHE 09/14/2006    Qualifier: Diagnosis of  By: Hassell Done [name], Tori Milks    . Glaucoma     NOT ON ANY EYE DROPS   . Legally blind     left eye   . Stroke     last 2003  RESIDUAL LEFT LEG WEAKNESS--NO OTHER RESIDUAL PROBLEMS  . CVA 11/14/2007    Qualifier: Diagnosis of  By: Hassell Done [name], Tori Milks    . Pain 09/23/11    LOWER BACK AND RIGHT SIDE--PT HAS RIGHT URETERAL STONE  . Nephrolithiasis     frequent urination , urination at nite  PT SEEN IN ER 09/22/11 FOR BACK AND RT SIDED PAIN--HAS KNOWN STONE RT URETER AND UA IN ER SHOWED UTI   Past Surgical History  Procedure  Laterality Date  . Kidney stone removal    . Brain hematoma evacutation      five procedures total, first procedure when 32 months old, last at 48 years of age.  . Shunt removal      shunt inserted at age 24 removed at age 56   . Eye surgery    . Retinal detachment surgery  1990  . Vaginal hysterectomy  1996  . Incisional hernia repair  05/05/2011    Procedure: LAPAROSCOPIC INCISIONAL HERNIA;  Surgeon: Rolm Bookbinder, MD;  Location: [place];  Service: General;  Laterality: N/A;  . Mass excision  05/05/2011    Procedure: EXCISION MASS;  Surgeon: Rolm Bookbinder, MD;  Location: [place];  Service: General;  Laterality: Right;  . Hernia repair  05/05/11    lap incisional hernia  . Pcnl    . Cystoscopy/retrograde/ureteroscopy  08/24/2011    Procedure: CYSTOSCOPY/RETROGRADE/URETEROSCOPY;  Surgeon: Molli Hazard, MD;  Location: [facility];  Service: Urology;  Laterality: Right;  Cysto, Right retrograde Pyelogram, right stent placement.   . Cystoscopy with ureteroscopy  09/24/2011    Procedure: CYSTOSCOPY WITH URETEROSCOPY;  Surgeon: Malka So, MD;  Location: [facility];  Service: Urology;  Laterality: Right;  Balloon dilation right ureter   . Cystoscopy w/ retrogrades  09/24/2011    Procedure: CYSTOSCOPY WITH RETROGRADE PYELOGRAM;  Surgeon: Malka So, MD;  Location: [facility];  Service: Urology;  Laterality: Right;   Social History:   reports that she quit smoking about 26 years ago. Her smoking use included Cigarettes. She has a 30 pack-year smoking history. She has never used smokeless tobacco. She reports that she does not drink alcohol or use illicit drugs.  Family History  Problem Relation Age of Onset  . Cancer Mother     uterine  . Cancer Maternal Grandmother     brain  . Anesthesia problems Neg Hx     Medications: Patient's Medications  New Prescriptions   No medications on file  Previous Medications   ALBUTEROL (PROVENTIL HFA;VENTOLIN HFA) 108 (90 BASE) MCG/ACT INHALER    Inhale 2  puffs into the lungs every 6 (six) hours as needed. Wheezing   BUDESONIDE-FORMOTEROL (SYMBICORT) 160-4.5 MCG/ACT INHALER    Inhale 2 puffs into the lungs 2 (two) times daily.   CETIRIZINE (ZYRTEC) 10 MG TABLET    Take 10 mg by mouth daily. For allergies   CLOPIDOGREL (PLAVIX) 75 MG TABLET    Take 1 tablet (75 mg total) by mouth daily.  FLUOXETINE (PROZAC) 20 MG CAPSULE    Take 3 capsules (60 mg total) by mouth daily at 12 noon.   FLUTICASONE (FLONASE) 50 MCG/ACT NASAL SPRAY    USE 2 SPRAYS IN EACH NOSTRIL EVERY DAY   GABAPENTIN (NEURONTIN) 300 MG CAPSULE    TAKE 2 CAPSULE BY MOUTH THREE TIMES A DAY   GLIPIZIDE XL 10 MG 24 HR TABLET    TAKE 2 TABLETS BY MOUTH DAILY   HYDROCODONE-ACETAMINOPHEN (NORCO/VICODIN) 5-325 MG PER TABLET    Take 2 tablets by mouth every 4 (four) hours as needed.   IBUPROFEN (ADVIL,MOTRIN) 800 MG TABLET    Take 1 tablet (800 mg total) by mouth 3 (three) times daily. With meals   INSULIN DETEMIR (LEVEMIR FLEXPEN) 100 UNIT/ML PEN    Inject into the skin daily at 10 pm. 30 units every morning and 40 units every night. For Diabetes   OMEPRAZOLE (PRILOSEC) 20 MG CAPSULE    Take 20 mg by mouth daily. For Hernia and acid reflux   ONDANSETRON (ZOFRAN) 8 MG TABLET    Take 8 mg by mouth every 6 (six) hours. PT STATES Nausea FROM PREVIOUS HERNIA REPAIR AND FROM KIDNEY STONE ---TAKES ZOFRAN  AT 8AM AND 12 NOON AND 8 PM  EVERY DAY   POLYETHYLENE GLYCOL (MIRALAX / GLYCOLAX) PACKET    Take 17 g by mouth daily as needed. Constipation.   PROMETHAZINE (PHENERGAN) 25 MG TABLET    Take 1 tablet (25 mg total) by mouth every 6 (six) hours as needed for nausea.   RANITIDINE (ZANTAC) 150 MG TABLET    Take 150 mg by mouth 2 (two) times daily. For Gastritis   SIMVASTATIN (ZOCOR) 20 MG TABLET    Take 20 mg by mouth daily. TAKES IN AM, for cholesterol   TDAP (BOOSTRIX) 5-2.5-18.5 LF-MCG/0.5 INJECTION    Inject 0.5 mLs into the muscle once.   TOPIRAMATE (TOPAMAX) 200 MG TABLET    TAKE 1 TABLET BY MOUTH  TWICE DAILY   VITAMIN D, ERGOCALCIFEROL, (DRISDOL) 50000 UNITS CAPS CAPSULE    Take 50,000 Units by mouth every 7 (seven) days. Thursday   ZOLPIDEM (AMBIEN) 5 MG TABLET    Take 1 tablet (5 mg total) by mouth at bedtime as needed for sleep.  Modified Medications   Modified Medication Previous Medication   BACLOFEN (LIORESAL) 10 MG TABLET baclofen (LIORESAL) 10 MG tablet      TAKE 1 TABLET BY MOUTH THREE TIMES A DAY for muscle spasms    TAKE 1 TABLET BY MOUTH THREE TIMES A DAY   DICLOFENAC (VOLTAREN) 75 MG EC TABLET diclofenac (VOLTAREN) 75 MG EC tablet      TAKE 1 TABLET BY MOUTH TWICE DAILY WITH FOOD AS NEEDED, for arthritis    TAKE 1 TABLET BY MOUTH TWICE DAILY WITH FOOD AS NEEDED   TRAZODONE (DESYREL) 150 MG TABLET traZODone (DESYREL) 150 MG tablet      Take 150 mg by mouth at bedtime. For Sleep    Take 1 tablet (150 mg total) by mouth at bedtime.   VERAPAMIL (VERELAN PM) 240 MG 24 HR CAPSULE verapamil (VERELAN PM) 240 MG 24 hr capsule      TAKE 1 CAPSULE BY MOUTH EVERY NIGHT AT BEDTIME for blood pressure    TAKE 1 CAPSULE BY MOUTH EVERY NIGHT AT BEDTIME  Discontinued Medications   DOXYCYCLINE (VIBRAMYCIN) 100 MG CAPSULE    Take 1 capsule (100 mg total) by mouth 2 (two) times daily. With meals   INSULIN  DETEMIR (LEVEMIR) 100 UNIT/ML INJECTION    Inject 30-40 Units into the skin 2 (two) times daily. 30 units every morning and 40 units every night.   METRONIDAZOLE (FLAGYL) 500 MG TABLET    Take 1 tablet (500 mg total) by mouth 2 (two) times daily.   ONDANSETRON (ZOFRAN ODT) 4 MG DISINTEGRATING TABLET    Take 1 tablet (4 mg total) by mouth every 8 (eight) hours as needed for nausea.   ONDANSETRON (ZOFRAN-ODT) 8 MG DISINTEGRATING TABLET    DISSOLVE 1 TABLET UNDER THE TONGUE EVERY 6 HOURS AS NEEDED   TAMSULOSIN HCL (FLOMAX) 0.4 MG CAPS    Take 0.4 mg by mouth daily after breakfast.     Physical Exam:  Filed Vitals:   04/13/13 0835  BP: 140/90  Pulse: 82  Resp: 10  Height: 5' 6.5" (1.689 m)   Weight: 246 lb (111.585 kg)  SpO2: 97%   Physical Exam  Constitutional: She is oriented to person, place, and time and well-developed, well-nourished, and in no distress.  HENT:  Head: Normocephalic and atraumatic.  Nose: Nose normal.  Mouth/Throat: Oropharynx is clear and moist. No oropharyngeal exudate.  Neck: Normal range of motion. Neck supple. No thyromegaly present.  Pulmonary/Chest: Effort normal. No respiratory distress.  diminished  Abdominal: Soft. Bowel sounds are normal. She exhibits no distension.  Musculoskeletal: She exhibits no edema and no tenderness.  Neurological: She is alert and oriented to person, place, and time.  Skin: Skin is warm and dry.  Psychiatric: Affect normal.    Labs reviewed: Basic Metabolic Panel:  Recent Labs  10/07/12 1745 02/01/13 1407 02/06/13 1810  NA 139 139 138  K 3.7 4.0 3.8  CL 108 104 105  CO2 25 20 21   GLUCOSE 89 102* 197*  BUN 14 14 15   CREATININE 0.89 0.91 0.87  CALCIUM 9.2 9.0 8.9   Liver Function Tests:  Recent Labs  02/01/13 1407 02/06/13 1810  AST 12 10  ALT 13 12  ALKPHOS 86 75  BILITOT 0.2* <0.2*  PROT 8.1 7.3  ALBUMIN 3.8 3.5    Recent Labs  02/06/13 1810  LIPASE 29   No results found for this basename: AMMONIA,  in the last 8760 hours CBC:  Recent Labs  10/07/12 1745 02/01/13 1407 02/06/13 1810  WBC 7.0 8.2 7.9  NEUTROABS 4.4 4.9  --   HGB 12.6 13.3 11.9*  HCT 38.2 39.9 36.7  MCV 90.3 91.1 91.8  PLT 306 289 249   Lipid Panel:  Recent Labs  10/07/12 1745  CHOL 163  HDL 39*  LDLCALC 105*  TRIG 94  CHOLHDL 4.2   TSH: No results found for this basename: TSH,  in the last 8760 hours A1C: No components found with this basename: A1C,    Assessment/Plan 1. HYPERTENSION -slightly elevated today; will cont to monitor -education given regarding lifestyle modifications - verapamil (VERELAN PM) 240 MG 24 hr capsule; TAKE 1 CAPSULE BY MOUTH EVERY NIGHT AT BEDTIME for blood pressure   Dispense: 30 capsule; Refill: 3 - Ambulatory referral to Cardiology  2. GERD - omeprazole (PRILOSEC) 20 MG capsule; Take 1 capsule (20 mg total) by mouth daily. For Hernia and acid reflux  Dispense: 30 capsule; Refill: 3 - ranitidine (ZANTAC) 150 MG tablet; Take 1 tablet (150 mg total) by mouth 2 (two) times daily. For Gastritis  Dispense: 60 tablet; Refill: 3 - Ambulatory referral to Gastroenterology due to ongoing GERD and Chronic nausea; takes medication has prescribed and has worked on  dietary modifications  3. DIABETES MELLITUS, TYPE II -pt does not have log; to bring at next visit; reports she is unsure if her diabetes in under good control - glipiZIDE (GLIPIZIDE XL) 10 MG 24 hr tablet; Take 2 tablets (20 mg total) by mouth daily with breakfast.  Dispense: 60 tablet; Refill: 0 - Ambulatory referral to Ophthalmology - Microalbumin, urine; Future - Hemoglobin A1c; Future - Comprehensive metabolic panel; Future - CBC With differential/Platelet; Future  4. HYPERLIPIDEMIA - simvastatin (ZOCOR) 20 MG tablet; Take 1 tablet (20 mg total) by mouth daily. TAKES IN AM, for cholesterol  Dispense: 30 tablet; Refill: 3 - Ambulatory referral to Cardiology -  Follow up Lipid panel before next visit  5. Anxiety and depression -following with behavioral health - FLUoxetine (PROZAC) 20 MG capsule; Take 3 capsules (60 mg total) by mouth daily at 12 noon.  Dispense: 90 capsule; Refill: 3  6. SEIZURE DISORDER -appears stable on topamax needs follow up with neurology; will get referral at this time - topiramate (TOPAMAX) 200 MG tablet; TAKE 1 TABLET BY MOUTH TWICE DAILY  Dispense: 60 tablet; Refill: 3 - Ambulatory referral to Neurology  7. Mixed urge and stress incontinence -follows with urology  8. MYOCARDIAL INFARCTION, HX OF -no recent events or ongoing chest pains - clopidogrel (PLAVIX) 75 MG tablet; Take 1 tablet (75 mg total) by mouth daily.  Dispense: 30 tablet; Refill: 3 - Ambulatory  referral to Cardiology  9. Chronic pain syndrome - baclofen (LIORESAL) 10 MG tablet; TAKE 1 TABLET BY MOUTH THREE TIMES A DAY for muscle spasms  Dispense: 90 each; Refill: 3 - Ambulatory referral to Pain Clinic  10. Unspecified hereditary and idiopathic peripheral neuropathy -controlled on neurontin - gabapentin (NEURONTIN) 300 MG capsule; TAKE 2 CAPSULE BY MOUTH THREE TIMES A DAY  Dispense: 180 capsule; Refill: 3  11. Insomnia -takes [place] and trazodone for this - traZODone (DESYREL) 150 MG tablet; Take 1 tablet (150 mg total) by mouth at bedtime. For Sleep  Dispense: 30 tablet; Refill: 1  12. Asthma, chronic -not controlled; taking albuterol 4 times a day every day; has not seen pulmonary doctor recently, will refer at this time - albuterol (PROVENTIL HFA;VENTOLIN HFA) 108 (90 BASE) MCG/ACT inhaler; Inhale 2 puffs into the lungs every 6 (six) hours as needed for wheezing or shortness of breath. Wheezing  Dispense: 6.7 g; Refill: 3 - budesonide-formoterol (SYMBICORT) 160-4.5 MCG/ACT inhaler; Inhale 2 puffs into the lungs 2 (two) times daily.  Dispense: 1 Inhaler; Refill: 3 - Ambulatory referral to Pulmonology  13. Chronic nausea -was in the process of getting referral for GI when she moved from [place] referral to Gastroenterology

## 2013-04-14 ENCOUNTER — Encounter: Payer: Self-pay | Admitting: Gastroenterology

## 2013-04-14 ENCOUNTER — Other Ambulatory Visit: Payer: Self-pay | Admitting: Internal Medicine

## 2013-04-14 ENCOUNTER — Other Ambulatory Visit: Payer: Self-pay | Admitting: Nurse Practitioner

## 2013-04-18 ENCOUNTER — Ambulatory Visit: Payer: PRIVATE HEALTH INSURANCE | Admitting: Neurology

## 2013-04-26 ENCOUNTER — Ambulatory Visit: Payer: PRIVATE HEALTH INSURANCE | Admitting: Neurology

## 2013-05-03 ENCOUNTER — Ambulatory Visit: Payer: Self-pay | Admitting: Nurse Practitioner

## 2013-05-08 ENCOUNTER — Institutional Professional Consult (permissible substitution): Payer: Self-pay | Admitting: Pulmonary Disease

## 2013-05-09 ENCOUNTER — Institutional Professional Consult (permissible substitution): Payer: Medicare Other | Admitting: Cardiology

## 2013-05-11 ENCOUNTER — Inpatient Hospital Stay (HOSPITAL_COMMUNITY)
Admission: AD | Admit: 2013-05-11 | Discharge: 2013-05-11 | Disposition: A | Discharge status: Home / Self Care | Payer: PRIVATE HEALTH INSURANCE | Source: Ambulatory Visit | Attending: Obstetrics & Gynecology | Admitting: Obstetrics & Gynecology

## 2013-05-11 ENCOUNTER — Encounter (HOSPITAL_COMMUNITY): Payer: Self-pay | Admitting: *Deleted

## 2013-05-11 ENCOUNTER — Telehealth: Payer: Self-pay | Admitting: Advanced Practice Midwife

## 2013-05-11 DIAGNOSIS — Z87891 Personal history of nicotine dependence: Secondary | ICD-10-CM | POA: Insufficient documentation

## 2013-05-11 DIAGNOSIS — J4489 Other specified chronic obstructive pulmonary disease: Secondary | ICD-10-CM | POA: Insufficient documentation

## 2013-05-11 DIAGNOSIS — I499 Cardiac arrhythmia, unspecified: Secondary | ICD-10-CM | POA: Insufficient documentation

## 2013-05-11 DIAGNOSIS — Z794 Long term (current) use of insulin: Secondary | ICD-10-CM | POA: Insufficient documentation

## 2013-05-11 DIAGNOSIS — R300 Dysuria: Secondary | ICD-10-CM | POA: Insufficient documentation

## 2013-05-11 DIAGNOSIS — R011 Cardiac murmur, unspecified: Secondary | ICD-10-CM | POA: Insufficient documentation

## 2013-05-11 DIAGNOSIS — N949 Unspecified condition associated with female genital organs and menstrual cycle: Secondary | ICD-10-CM | POA: Insufficient documentation

## 2013-05-11 DIAGNOSIS — I10 Essential (primary) hypertension: Secondary | ICD-10-CM | POA: Insufficient documentation

## 2013-05-11 DIAGNOSIS — B373 Candidiasis of vulva and vagina: Secondary | ICD-10-CM | POA: Insufficient documentation

## 2013-05-11 DIAGNOSIS — E1142 Type 2 diabetes mellitus with diabetic polyneuropathy: Secondary | ICD-10-CM | POA: Insufficient documentation

## 2013-05-11 DIAGNOSIS — K219 Gastro-esophageal reflux disease without esophagitis: Secondary | ICD-10-CM | POA: Insufficient documentation

## 2013-05-11 DIAGNOSIS — E1149 Type 2 diabetes mellitus with other diabetic neurological complication: Secondary | ICD-10-CM | POA: Insufficient documentation

## 2013-05-11 DIAGNOSIS — B3731 Acute candidiasis of vulva and vagina: Secondary | ICD-10-CM | POA: Insufficient documentation

## 2013-05-11 DIAGNOSIS — J449 Chronic obstructive pulmonary disease, unspecified: Secondary | ICD-10-CM | POA: Insufficient documentation

## 2013-05-11 LAB — URINALYSIS, ROUTINE W REFLEX MICROSCOPIC
Bilirubin Urine: NEGATIVE
Glucose, UA: >1000 mg/dL — AB
Ketones, ur: NEGATIVE mg/dL
Nitrite: NEGATIVE
Protein, ur: NEGATIVE mg/dL
Specific Gravity, Urine: 1.02 (ref 1.005–1.030)
Urobilinogen, UA: 1 mg/dL (ref 0.0–1.0)
pH: 6.5 (ref 5.0–8.0)

## 2013-05-11 LAB — WET PREP, GENITAL
Clue Cells Wet Prep HPF POC: NONE SEEN
Trich, Wet Prep: NONE SEEN

## 2013-05-11 LAB — URINE MICROSCOPIC-ADD ON

## 2013-05-11 MED ORDER — NYSTATIN-TRIAMCINOLONE 100000-0.1 UNIT/GM-% EX OINT: 1.0000 "application " | TOPICAL_OINTMENT | Freq: Two times a day (BID) | CUTANEOUS | Status: DC

## 2013-05-11 MED ORDER — FLUCONAZOLE 150 MG PO TABS: 150.0000 mg | ORAL_TABLET | Freq: Once | ORAL | Status: DC

## 2013-05-11 MED ORDER — FLUCONAZOLE 150 MG PO TABS
150.0000 mg | ORAL_TABLET | Freq: Once | ORAL | Status: AC
  Administered 2013-05-11: 150 mg via ORAL
  Filled 2013-05-11: qty 1

## 2013-05-11 NOTE — Telephone Encounter (Signed)
Patient called, and the incorrect pharmacy was in the chart. Would like RX sent to [facility]

## 2013-05-11 NOTE — MAU Note (Signed)
Patient states she has had vaginal pain and irritation for 1-2 weeks. States she has a weak bladder and wears depends due to leaking. States she has recurrent UTI's. Denies bleeding or discharge.

## 2013-05-11 NOTE — Discharge Instructions (Signed)
Candidal Vulvovaginitis  Candidal vulvovaginitis is an infection of the vagina and vulva. The vulva is the skin around the opening of the vagina. This may cause itching and discomfort in and around the vagina.   HOME CARE  · Only take medicine as told by your doctor.  · Do not have sex (intercourse) until the infection is healed or as told by your doctor.  · Practice safe sex.  · Tell your sex partner about your infection.  · Do not douche or use tampons.  · Wear cotton underwear. Do not wear tight pants or panty hose.  · Eat yogurt. This may help treat and prevent yeast infections.  GET HELP RIGHT AWAY IF:   · You have a fever.  · Your problems get worse during treatment or do not get better in 3 days.  · You have discomfort, irritation, or itching in your vagina or vulva area.  · You have pain after sex.  · You start to get belly (abdominal) pain.  MAKE SURE YOU:  · Understand these instructions.  · Will watch your condition.  · Will get help right away if you are not doing well or get worse.  Document Released: 04/03/2008 Document Revised: 03/30/2011 Document Reviewed: 04/03/2008  [employer]® Patient Information ©2014 [employer].

## 2013-05-11 NOTE — MAU Provider Note (Signed)
History     CSN: [id number]  Arrival date and time: 05/11/13 1535   First Provider Initiated Contact with Patient 05/11/13 1649      Chief Complaint  Patient presents with  . Vaginal Pain   Vaginal Pain    [name] is a 48 y.o. [id number] who presents today with painful urination and vulvar pain.   Past Medical History  Diagnosis Date  . Hypertension   . Asthma   . Seizures   . Heart murmur     born with   . Dysrhythmia     skipped beats  . Myocardial infarction     hi of x 2  last one 2003   . COPD (chronic obstructive pulmonary disease)     chronic bronchitis   . Pneumonia     hx of 2009  . GERD (gastroesophageal reflux disease)   . Headache(784.0)     migraines   . Neuropathy     due to diabetes   . Dizziness     secondary to diabetes and hypertension   . Arthritis     joint pain   . Hx of blood clots     hematomas removed from left side of brain from 31mo to 89yrold   . Epilepsy idiopathic petit mal     last seizure 2012;controlled w/ topomax  . Diabetic neuropathy, painful     FEET AND HANDS  . Diabetes mellitus     since age [age]type 2 IDDM  . Sleep apnea     sleep study 2010 [facility];does not use Cpap ; mild  . MIGRAINE HEADACHE 09/14/2006    Qualifier: Diagnosis of  By: MaHassell DoneNP, NyTori Milks  . Glaucoma     NOT ON ANY EYE DROPS   . Legally blind     left eye   . Stroke     last 2003  RESIDUAL LEFT LEG WEAKNESS--NO OTHER RESIDUAL PROBLEMS  . CVA 11/14/2007    Qualifier: Diagnosis of  By: MaHassell DoneNP, NyTori Milks  . Pain 09/23/11    LOWER BACK AND RIGHT SIDE--PT HAS RIGHT URETERAL STONE  . Nephrolithiasis     frequent urination , urination at nite  PT SEEN IN ER 09/22/11 FOR BACK AND RT SIDED PAIN--HAS KNOWN STONE RT URETER AND UA IN ER SHOWED UTI    Past Surgical History  Procedure Laterality Date  . Kidney stone removal    . Brain hematoma evacutation      five procedures total, first procedure when [age]onths old, last at [age]years of age.  . Shunt removal      shunt inserted at age [age]emoved at age 48 . Eye surgery    . Retinal detachment surgery  1990  . Vaginal hysterectomy  1996  . Incisional hernia repair  05/05/2011    Procedure: LAPAROSCOPIC INCISIONAL HERNIA;  Surgeon: MaRolm BookbinderMD;  [facility] Service: General;  Laterality: N/A;  . Mass excision  05/05/2011    Procedure: EXCISION MASS;  Surgeon: MaRolm BookbinderMD;  [facility] Service: General;  Laterality: Right;  . Hernia repair  05/05/11    lap incisional hernia  . Pcnl    . Cystoscopy/retrograde/ureteroscopy  08/24/2011    Procedure: CYSTOSCOPY/RETROGRADE/URETEROSCOPY;  Surgeon: DaMolli HazardMD;  Location: [facility];  Service: Urology;  Laterality: Right;  Cysto, Right retrograde Pyelogram, right stent placement.   . Cystoscopy with ureteroscopy  09/24/2011  Procedure: CYSTOSCOPY WITH URETEROSCOPY;  Surgeon: Malka So, MD;  Location: [facility];  Service: Urology;  Laterality: Right;  Balloon dilation right ureter   . Cystoscopy w/ retrogrades  09/24/2011    Procedure: CYSTOSCOPY WITH RETROGRADE PYELOGRAM;  Surgeon: Malka So, MD;  Location: [facility];  Service: Urology;  Laterality: Right;    Family History  Problem Relation Age of Onset  . Cancer Mother     uterine  . Hypertension Mother   . Cancer Maternal Grandmother     brain  . Hypertension Maternal Grandmother   . Anesthesia problems Neg Hx     History  Substance Use Topics  . Smoking status: Former Smoker -- 1.00 packs/day for 30 years    Types: Cigarettes    Quit date: 01/20/1987  . Smokeless tobacco: Never Used     Comment: quit more than 20years  . Alcohol Use: No    Allergies:  Allergies  Allergen Reactions  . Codeine Swelling and Other (See Comments)    Swelling and burning of mouth (inside)  . Lactose Intolerance (Gi)     Prescriptions prior to admission  Medication Sig Dispense Refill  . albuterol (PROVENTIL HFA;VENTOLIN HFA) 108  (90 BASE) MCG/ACT inhaler Inhale 2 puffs into the lungs every 6 (six) hours as needed for wheezing or shortness of breath. Wheezing  6.7 g  3  . baclofen (LIORESAL) 10 MG tablet TAKE 1 TABLET BY MOUTH THREE TIMES A DAY for muscle spasms  90 each  3  . budesonide-formoterol (SYMBICORT) 160-4.5 MCG/ACT inhaler Inhale 2 puffs into the lungs 2 (two) times daily.  1 Inhaler  3  . cetirizine (ZYRTEC) 10 MG tablet Take 10 mg by mouth 2 (two) times daily. For allergies      . clopidogrel (PLAVIX) 75 MG tablet Take 1 tablet (75 mg total) by mouth daily.  30 tablet  3  . diclofenac (VOLTAREN) 75 MG EC tablet TAKE 1 TABLET BY MOUTH TWICE DAILY WITH FOOD AS NEEDED, for arthritis      . FLUoxetine (PROZAC) 20 MG capsule Take 3 capsules (60 mg total) by mouth daily at 12 noon.  90 capsule  3  . fluticasone (FLONASE) 50 MCG/ACT nasal spray USE 2 SPRAYS IN EACH NOSTRIL EVERY DAY  16 g  0  . gabapentin (NEURONTIN) 300 MG capsule TAKE 2 CAPSULE BY MOUTH THREE TIMES A DAY  180 capsule  3  . glipiZIDE (GLIPIZIDE XL) 10 MG 24 hr tablet Take 2 tablets (20 mg total) by mouth daily with breakfast.  60 tablet  0  . Insulin Detemir (LEVEMIR FLEXPEN) 100 UNIT/ML Pen Inject into the skin daily at 10 pm. 30 units every morning and 40 units every night. For Diabetes      . omeprazole (PRILOSEC) 20 MG capsule Take 1 capsule (20 mg total) by mouth daily. For Hernia and acid reflux  30 capsule  3  . ondansetron (ZOFRAN) 8 MG tablet Take 8 mg by mouth every 6 (six) hours as needed for nausea.       . promethazine (PHENERGAN) 25 MG tablet Take 1 tablet (25 mg total) by mouth every 6 (six) hours as needed for nausea.  20 tablet  0  . ranitidine (ZANTAC) 150 MG tablet Take 1 tablet (150 mg total) by mouth 2 (two) times daily. For Gastritis  60 tablet  3  . simvastatin (ZOCOR) 20 MG tablet Take 1 tablet (20 mg total) by mouth daily. TAKES IN AM, for cholesterol  30 tablet  3  . topiramate (TOPAMAX) 200 MG tablet TAKE 1 TABLET BY MOUTH  TWICE DAILY  60 tablet  3  . traZODone (DESYREL) 150 MG tablet Take 1 tablet (150 mg total) by mouth at bedtime. For Sleep  30 tablet  1  . verapamil (VERELAN PM) 240 MG 24 hr capsule TAKE 1 CAPSULE BY MOUTH EVERY NIGHT AT BEDTIME for blood pressure  30 capsule  3  . Vitamin D, Ergocalciferol, (DRISDOL) 50000 UNITS CAPS capsule Take 50,000 Units by mouth every 7 (seven) days. Thursday      . zolpidem (AMBIEN) 5 MG tablet Take 1 tablet (5 mg total) by mouth at bedtime as needed for sleep.  30 tablet  5  . EASY TOUCH PEN NEEDLES 31G X 8 MM MISC USE AS DIRECTED TO INJECT INSULIN DAILY  100 each  0  . ONETOUCH DELICA LANCETS 35W MISC TEST BLOOD SUGAR FOUR TIMES A DAY  200 each  0    Review of Systems  Genitourinary: Positive for vaginal pain.   Physical Exam   Blood pressure 136/94, pulse 93, temperature 98.1 F (36.7 C), temperature source Oral, resp. rate 20, height 5' 5"  (1.651 m), weight 109.952 kg (242 lb 6.4 oz), SpO2 99.00%.  Physical Exam  Nursing note and vitals reviewed. Constitutional: She is oriented to person, place, and time. She appears well-developed and well-nourished. No distress.  Cardiovascular: Normal rate.   Respiratory: Effort normal.  GI: Soft. There is no tenderness.  Genitourinary:  External: no lesion Vagina: large amount of thick, green vaginal discharge  Cervix: SA Uterus:SA Adnexa: NT   Neurological: She is alert and oriented to person, place, and time.  Skin: Skin is warm and dry.  Psychiatric: She has a normal mood and affect.    MAU Course  Procedures  Results for orders placed during the hospital encounter of 05/11/13 (from the past 24 hour(s))  URINALYSIS, ROUTINE W REFLEX MICROSCOPIC     Status: Abnormal   Collection Time    05/11/13  4:35 PM      Result Value Ref Range   Color, Urine YELLOW  YELLOW   APPearance CLOUDY (*) CLEAR   Specific Gravity, Urine 1.020  1.005 - 1.030   pH 6.5  5.0 - 8.0   Glucose, UA >1000 (*) NEGATIVE mg/dL    Hgb urine dipstick SMALL (*) NEGATIVE   Bilirubin Urine NEGATIVE  NEGATIVE   Ketones, ur NEGATIVE  NEGATIVE mg/dL   Protein, ur NEGATIVE  NEGATIVE mg/dL   Urobilinogen, UA 1.0  0.0 - 1.0 mg/dL   Nitrite NEGATIVE  NEGATIVE   Leukocytes, UA SMALL (*) NEGATIVE  URINE MICROSCOPIC-ADD ON     Status: Abnormal   Collection Time    05/11/13  4:35 PM      Result Value Ref Range   Squamous Epithelial / LPF MANY (*) RARE   WBC, UA 21-50  <3 WBC/hpf   RBC / HPF 0-2  <3 RBC/hpf   Urine-Other MANY YEAST    WET PREP, GENITAL     Status: Abnormal   Collection Time    05/11/13  4:51 PM      Result Value Ref Range   Yeast Wet Prep HPF POC MODERATE (*) NONE SEEN   Trich, Wet Prep NONE SEEN  NONE SEEN   Clue Cells Wet Prep HPF POC NONE SEEN  NONE SEEN   WBC, Wet Prep HPF POC MANY (*) NONE SEEN     Assessment and Plan  1. Vulvovaginitis due to Candida    RX: diflucan and mycolog cream FU with the clinic as needed Return to MAU as needed Urine culture pending   Follow-up Information   Follow up with [facility] (If symptoms worsen)    Specialty:  Obstetrics and Gynecology   Contact information:   [place] [address] [place] 05/11/2013, 4:55 PM

## 2013-05-12 ENCOUNTER — Other Ambulatory Visit: Payer: Self-pay | Admitting: Nurse Practitioner

## 2013-05-12 ENCOUNTER — Other Ambulatory Visit: Payer: Self-pay | Admitting: Internal Medicine

## 2013-05-12 LAB — GC/CHLAMYDIA PROBE AMP
CT Probe RNA: NEGATIVE
GC Probe RNA: NEGATIVE

## 2013-05-13 LAB — URINE CULTURE: Colony Count: 85000

## 2013-05-14 ENCOUNTER — Telehealth: Payer: Self-pay | Admitting: Obstetrics and Gynecology

## 2013-05-14 MED ORDER — CEPHALEXIN 500 MG PO CAPS: 500.0000 mg | ORAL_CAPSULE | Freq: Four times a day (QID) | ORAL | Status: DC

## 2013-05-15 ENCOUNTER — Other Ambulatory Visit: Payer: PRIVATE HEALTH INSURANCE

## 2013-05-17 ENCOUNTER — Encounter: Payer: PRIVATE HEALTH INSURANCE | Admitting: Nurse Practitioner

## 2013-05-23 ENCOUNTER — Encounter: Payer: Self-pay | Admitting: Internal Medicine

## 2013-05-23 ENCOUNTER — Ambulatory Visit (INDEPENDENT_AMBULATORY_CARE_PROVIDER_SITE_OTHER): Payer: PRIVATE HEALTH INSURANCE | Admitting: Internal Medicine

## 2013-05-23 VITALS — BP 132/86 | HR 92 | Temp 97.9°F | Wt 246.0 lb

## 2013-05-23 DIAGNOSIS — E1149 Type 2 diabetes mellitus with other diabetic neurological complication: Secondary | ICD-10-CM

## 2013-05-23 DIAGNOSIS — E1142 Type 2 diabetes mellitus with diabetic polyneuropathy: Secondary | ICD-10-CM

## 2013-05-23 DIAGNOSIS — K219 Gastro-esophageal reflux disease without esophagitis: Secondary | ICD-10-CM

## 2013-05-23 DIAGNOSIS — E785 Hyperlipidemia, unspecified: Secondary | ICD-10-CM

## 2013-05-23 DIAGNOSIS — E1140 Type 2 diabetes mellitus with diabetic neuropathy, unspecified: Secondary | ICD-10-CM | POA: Insufficient documentation

## 2013-05-23 MED ORDER — ASPIRIN 81 MG PO TABS: 81.0000 mg | ORAL_TABLET | Freq: Every day | ORAL | Status: DC

## 2013-05-23 MED ORDER — TROSPIUM CHLORIDE 20 MG PO TABS: ORAL_TABLET | ORAL | Status: DC

## 2013-05-23 MED ORDER — INSULIN DETEMIR 100 UNIT/ML FLEXPEN: PEN_INJECTOR | SUBCUTANEOUS | Status: DC

## 2013-05-23 NOTE — Progress Notes (Signed)
Patient ID: [name], female   DOB: Jun 10, 1965, 48 y.o.   MRN: [id number]    Chief Complaint  Patient presents with  . Acute Visit    blood sugar elevated x  2 weeks (200-300)   HPI Patient is here with elevated blood glucose readings She has history of dm since age of 6. She is on levemir 30 u in am and 40 u in pm along with glipizide 20 mg daily. Her sugar has been elevated and is between 200-400. She tries to eat less of carb and focuses more on protein. Has gained 4 lbs since last visit. Recently had yeast infection and was on antibiotics from her obgyn.  Denies polyuria or polydypsia No ulcers/ sores in feet Denies blurry vision Feels tired No other complaints  ROS Denies nausea, vomiting, abdominal pain, diarrhea Denies chest pain or dyspnea Denies headache or blurred vision or focal weakness Mood has been stable  Past Medical History  Diagnosis Date  . Hypertension   . Asthma   . Seizures   . Heart murmur     born with   . Dysrhythmia     skipped beats  . Myocardial infarction     hi of x 2  last one 2003   . COPD (chronic obstructive pulmonary disease)     chronic bronchitis   . Pneumonia     hx of 2009  . GERD (gastroesophageal reflux disease)   . Headache(784.0)     migraines   . Neuropathy     due to diabetes   . Dizziness     secondary to diabetes and hypertension   . Arthritis     joint pain   . Hx of blood clots     hematomas removed from left side of brain from 5mo to 89yrold   . Epilepsy idiopathic petit mal     last seizure 2012;controlled w/ topomax  . Diabetic neuropathy, painful     FEET AND HANDS  . Diabetes mellitus     since age [age]type 2 IDDM  . Sleep apnea     sleep study 2010 [facility];does not use Cpap ; mild  . MIGRAINE HEADACHE 09/14/2006    Qualifier: Diagnosis of  By: MaHassell DoneNP, NyTori Milks  . Glaucoma     NOT ON ANY EYE DROPS   . Legally blind     left eye   . Stroke     last 2003  RESIDUAL LEFT LEG  WEAKNESS--NO OTHER RESIDUAL PROBLEMS  . CVA 11/14/2007    Qualifier: Diagnosis of  By: MaHassell DoneNP, NyTori Milks  . Pain 09/23/11    LOWER BACK AND RIGHT SIDE--PT HAS RIGHT URETERAL STONE  . Nephrolithiasis     frequent urination , urination at nite  PT SEEN IN ER 09/22/11 FOR BACK AND RT SIDED PAIN--HAS KNOWN STONE RT URETER AND UA IN ER SHOWED UTI   Past Surgical History  Procedure Laterality Date  . Kidney stone removal    . Brain hematoma evacutation      five procedures total, first procedure when [age]onths old, last at [age]ears of age.  . Shunt removal      shunt inserted at age [age]emoved at age [age] . Eye surgery    . Retinal detachment surgery  1990  . Vaginal hysterectomy  1996  . Incisional hernia repair  05/05/2011    Procedure: LAPAROSCOPIC INCISIONAL HERNIA;  Surgeon: MaRolm BookbinderMD;  Location: [facility];  Service: General;  Laterality: N/A;  . Mass excision  05/05/2011    Procedure: EXCISION MASS;  Surgeon: Rolm Bookbinder, MD;  Location: [place];  Service: General;  Laterality: Right;  . Hernia repair  05/05/11    lap incisional hernia  . Pcnl    . Cystoscopy/retrograde/ureteroscopy  08/24/2011    Procedure: CYSTOSCOPY/RETROGRADE/URETEROSCOPY;  Surgeon: Molli Hazard, MD;  Location: [facility];  Service: Urology;  Laterality: Right;  Cysto, Right retrograde Pyelogram, right stent placement.   . Cystoscopy with ureteroscopy  09/24/2011    Procedure: CYSTOSCOPY WITH URETEROSCOPY;  Surgeon: Malka So, MD;  Location: [facility];  Service: Urology;  Laterality: Right;  Balloon dilation right ureter   . Cystoscopy w/ retrogrades  09/24/2011    Procedure: CYSTOSCOPY WITH RETROGRADE PYELOGRAM;  Surgeon: Malka So, MD;  Location: [facility];  Service: Urology;  Laterality: Right;   Current Outpatient Prescriptions on File Prior to Visit  Medication Sig Dispense Refill  . albuterol (PROVENTIL HFA;VENTOLIN HFA) 108 (90 BASE) MCG/ACT inhaler Inhale 2 puffs into the lungs every 6 (six) hours  as needed for wheezing or shortness of breath. Wheezing  6.7 g  3  . baclofen (LIORESAL) 10 MG tablet TAKE 1 TABLET BY MOUTH THREE TIMES A DAY for muscle spasms  90 each  3  . budesonide-formoterol (SYMBICORT) 160-4.5 MCG/ACT inhaler Inhale 2 puffs into the lungs 2 (two) times daily.  1 Inhaler  3  . cetirizine (ZYRTEC) 10 MG tablet Take 10 mg by mouth 2 (two) times daily. For allergies      . clopidogrel (PLAVIX) 75 MG tablet Take 1 tablet (75 mg total) by mouth daily.  30 tablet  3  . diclofenac (VOLTAREN) 75 MG EC tablet TAKE 1 TABLET BY MOUTH TWICE DAILY WITH FOOD AS NEEDED, for arthritis      . EASY TOUCH PEN NEEDLES 31G X 8 MM MISC USE AS DIRECTED TO INJECT INSULIN DAILY  100 each  0  . FLUoxetine (PROZAC) 20 MG capsule Take 3 capsules (60 mg total) by mouth daily at 12 noon.  90 capsule  3  . fluticasone (FLONASE) 50 MCG/ACT nasal spray USE 2 SPRAYS IN EACH NOSTRIL EVERY DAY  16 g  0  . gabapentin (NEURONTIN) 300 MG capsule TAKE 2 CAPSULE BY MOUTH THREE TIMES A DAY  180 capsule  3  . GLIPIZIDE XL 10 MG 24 hr tablet TAKE 2 TABLETS BY MOUTH EVERY DAY WITH BREAKFAST  60 tablet  0  . nystatin-triamcinolone ointment (MYCOLOG) Apply 1 application topically 2 (two) times daily.  30 g  0  . omeprazole (PRILOSEC) 20 MG capsule Take 1 capsule (20 mg total) by mouth daily. For Hernia and acid reflux  30 capsule  3  . ONETOUCH DELICA LANCETS 74J MISC TEST BLOOD SUGAR FOUR TIMES A DAY  200 each  0  . promethazine (PHENERGAN) 25 MG tablet Take 1 tablet (25 mg total) by mouth every 6 (six) hours as needed for nausea.  20 tablet  0  . ranitidine (ZANTAC) 150 MG tablet Take 1 tablet (150 mg total) by mouth 2 (two) times daily. For Gastritis  60 tablet  3  . simvastatin (ZOCOR) 20 MG tablet Take 1 tablet (20 mg total) by mouth daily. TAKES IN AM, for cholesterol  30 tablet  3  . topiramate (TOPAMAX) 200 MG tablet TAKE 1 TABLET BY MOUTH TWICE DAILY  60 tablet  3  . traZODone (DESYREL) 150 MG  tablet Take 1  tablet (150 mg total) by mouth at bedtime. For Sleep  30 tablet  1  . verapamil (VERELAN PM) 240 MG 24 hr capsule TAKE 1 CAPSULE BY MOUTH EVERY NIGHT AT BEDTIME for blood pressure  30 capsule  3  . Vitamin D, Ergocalciferol, (DRISDOL) 50000 UNITS CAPS capsule Take 50,000 Units by mouth every 7 (seven) days. Thursday      . zolpidem (AMBIEN) 5 MG tablet Take 1 tablet (5 mg total) by mouth at bedtime as needed for sleep.  30 tablet  5   No current facility-administered medications on file prior to visit.    Physical exam BP 132/86  Pulse 92  Temp(Src) 97.9 F (36.6 C) (Oral)  Wt 246 lb (111.585 kg)  SpO2 99%  Constitutional: She is oriented to person, place, and time and well-developed, well-nourished, and in no distress. obese HENT:   Head: Normocephalic and atraumatic.   Nose: Nose normal.   Mouth/Throat: Oropharynx is clear and moist. No oropharyngeal exudate.  Neck: Normal range of motion. Neck supple. No thyromegaly present.  Pulmonary/Chest: Effort normal. No respiratory distress.  Abdominal: Soft. Bowel sounds are normal. She exhibits no distension.  Musculoskeletal: She exhibits no edema and no tenderness.  Neurological: She is alert and oriented to person, place, and time. Normal pinprick and vibration sensation on right side. Decreased pinprick on left side in foot area but normal vibration sensation. No corns/ callus noted Skin: Skin is warm and dry.  Psychiatric: Affect normal.    Labs- Lab Results  Component Value Date   HGBA1C 7.4* 10/07/2012   CBC    Component Value Date/Time   WBC 7.9 02/06/2013 1810   RBC 4.00 02/06/2013 1810   HGB 11.9* 02/06/2013 1810   HCT 36.7 02/06/2013 1810   PLT 249 02/06/2013 1810   MCV 91.8 02/06/2013 1810   MCH 29.8 02/06/2013 1810   MCHC 32.4 02/06/2013 1810   RDW 13.7 02/06/2013 1810   LYMPHSABS 2.5 02/01/2013 1407   MONOABS 0.7 02/01/2013 1407   EOSABS 0.0 02/01/2013 1407   BASOSABS 0.0 02/01/2013 1407    CMP     Component Value  Date/Time   NA 138 02/06/2013 1810   K 3.8 02/06/2013 1810   CL 105 02/06/2013 1810   CO2 21 02/06/2013 1810   GLUCOSE 197* 02/06/2013 1810   BUN 15 02/06/2013 1810   CREATININE 0.87 02/06/2013 1810   CREATININE 0.89 10/07/2012 1745   CALCIUM 8.9 02/06/2013 1810   PROT 7.3 02/06/2013 1810   ALBUMIN 3.5 02/06/2013 1810   AST 10 02/06/2013 1810   ALT 12 02/06/2013 1810   ALKPHOS 75 02/06/2013 1810   BILITOT <0.2* 02/06/2013 1810   GFRNONAA 78* 02/06/2013 1810   GFRAA 90* 02/06/2013 1810   Assessment/plan  1. DM type 2 with diabetic peripheral neuropathy No recent a1c. With elevated cbg, advised on calorie count with diet and will increase her levemir to 40 u in am and pm. Continue glipizide. Review a1c and if is > 9, consider adding premeal insulin. Check urine microalbumin - Hemoglobin A1c - CMP - Microalbumin/Creatinine Ratio, Urine  2. GERD Continue ppi, symptom controlled  3. HYPERLIPIDEMIA Check lipid panel. Continue zocor for now - Lipid Panel  Check a1c, urine microalbumin

## 2013-05-24 LAB — LIPID PANEL
Chol/HDL Ratio: 3.8 ratio units (ref 0.0–4.4)
Cholesterol, Total: 139 mg/dL (ref 100–199)
HDL: 37 mg/dL — ABNORMAL LOW (ref >39)
LDL Calculated: 64 mg/dL (ref 0–99)
Triglycerides: 189 mg/dL — ABNORMAL HIGH (ref 0–149)
VLDL Cholesterol Cal: 38 mg/dL (ref 5–40)

## 2013-05-24 LAB — COMPREHENSIVE METABOLIC PANEL
ALT: 14 IU/L (ref 0–32)
AST: 8 IU/L (ref 0–40)
Albumin/Globulin Ratio: 1.4 (ref 1.1–2.5)
Albumin: 4 g/dL (ref 3.5–5.5)
Alkaline Phosphatase: 99 IU/L (ref 39–117)
BUN/Creatinine Ratio: 17 (ref 9–23)
BUN: 15 mg/dL (ref 6–24)
CO2: 18 mmol/L (ref 18–29)
Calcium: 9.3 mg/dL (ref 8.7–10.2)
Chloride: 99 mmol/L (ref 97–108)
Creatinine, Ser: 0.87 mg/dL (ref 0.57–1.00)
GFR calc Af Amer: 91 mL/min/{1.73_m2} (ref >59)
GFR calc non Af Amer: 79 mL/min/{1.73_m2} (ref >59)
Globulin, Total: 2.9 g/dL (ref 1.5–4.5)
Glucose: 349 mg/dL — ABNORMAL HIGH (ref 65–99)
Potassium: 4 mmol/L (ref 3.5–5.2)
Sodium: 137 mmol/L (ref 134–144)
Total Bilirubin: <0.2 mg/dL (ref 0.0–1.2)
Total Protein: 6.9 g/dL (ref 6.0–8.5)

## 2013-05-24 LAB — MICROALBUMIN / CREATININE URINE RATIO
Creatinine, Ur: 31.3 mg/dL (ref 15.0–278.0)
MICROALB/CREAT RATIO: 21.7 mg/g creat (ref 0.0–30.0)
Microalbumin, Urine: 6.8 ug/mL (ref 0.0–17.0)

## 2013-05-24 LAB — HEMOGLOBIN A1C
Est. average glucose Bld gHb Est-mCnc: 295 mg/dL
Hgb A1c MFr Bld: 11.9 % — ABNORMAL HIGH (ref 4.8–5.6)

## 2013-05-30 ENCOUNTER — Telehealth: Payer: Self-pay | Admitting: *Deleted

## 2013-05-30 NOTE — Telephone Encounter (Signed)
Patient called and stated that [name] is not covered by her insurance and would like something else called into pharmacy. Please Advise.

## 2013-05-30 NOTE — Telephone Encounter (Signed)
Please fwd this to Hassell Done for review

## 2013-05-30 NOTE — Telephone Encounter (Signed)
Forwarded to [employer]

## 2013-05-30 NOTE — Telephone Encounter (Signed)
There will be no medication changes until pt keeps her lab and next appt, she is overdue for blood work and needs OV, has physical scheduled- needs to keep appt

## 2013-05-31 ENCOUNTER — Encounter: Payer: Self-pay | Admitting: Advanced Practice Midwife

## 2013-05-31 NOTE — Telephone Encounter (Signed)
Patient Notified and will keep appointment.

## 2013-06-02 ENCOUNTER — Ambulatory Visit: Payer: Self-pay | Admitting: Gastroenterology

## 2013-06-02 ENCOUNTER — Telehealth: Payer: Self-pay | Admitting: Gastroenterology

## 2013-06-02 NOTE — Telephone Encounter (Signed)
No charge. 

## 2013-06-05 ENCOUNTER — Ambulatory Visit (INDEPENDENT_AMBULATORY_CARE_PROVIDER_SITE_OTHER): Payer: PRIVATE HEALTH INSURANCE | Admitting: Cardiology

## 2013-06-05 ENCOUNTER — Other Ambulatory Visit: Payer: Self-pay | Admitting: Internal Medicine

## 2013-06-05 ENCOUNTER — Other Ambulatory Visit: Payer: PRIVATE HEALTH INSURANCE

## 2013-06-05 ENCOUNTER — Encounter: Payer: Self-pay | Admitting: Cardiology

## 2013-06-05 VITALS — BP 130/74 | HR 81 | Ht 65.0 in | Wt 239.0 lb

## 2013-06-05 DIAGNOSIS — E785 Hyperlipidemia, unspecified: Secondary | ICD-10-CM

## 2013-06-05 DIAGNOSIS — I10 Essential (primary) hypertension: Secondary | ICD-10-CM

## 2013-06-05 DIAGNOSIS — R0602 Shortness of breath: Secondary | ICD-10-CM

## 2013-06-05 DIAGNOSIS — E119 Type 2 diabetes mellitus without complications: Secondary | ICD-10-CM

## 2013-06-05 DIAGNOSIS — I252 Old myocardial infarction: Secondary | ICD-10-CM

## 2013-06-05 NOTE — Patient Instructions (Signed)
Your physician recommends that you continue on your current medications as directed. Please refer to the Current Medication list given to you today.  Your physician has requested that you have an echocardiogram. Echocardiography is a painless test that uses sound waves to create images of your heart. It provides your doctor with information about the size and shape of your heart and how well your heart's chambers and valves are working. This procedure takes approximately one hour. There are no restrictions for this procedure.  Your physician recommends that you schedule a follow-up appointment As Needed with Dr Radford Pax

## 2013-06-05 NOTE — Consult Note (Addendum)
[name], [name], [name]  [address] Phone: [phone] Fax:  [phone]  Date:  06/05/2013   ID:  [name], DOB Dec 11, 1965, MRN [id number]  PCP:  Delrae Rend, NP  Cardiologist:  Fransico Him, MD     History of Present Illness: [name] is a 48 y.o. female with a history of HTN, heart murmur at birth.  She was evaluated by Cardiology in 2013 for post op tachycardia that was felt related to physiologic stress with ileus and abdominal pain.  She carries a diagnosis of MI x2 remotely with last being in 2003 in setting of her CVA but I do not have any documentation of this and she never had a cardia workup done. This was apparently at [facility].  She also has a history of DM and COPD presents today for cardiac evaluation.  She denies any chest pain but has been having SOB felt due to her asthma.  She denies any palpitations, LE edema, dizziness or syncope.  Wt Readings from Last 3 Encounters:  06/05/13 239 lb (108.41 kg)  05/23/13 246 lb (111.585 kg)  05/11/13 242 lb 6.4 oz (109.952 kg)     Past Medical History  Diagnosis Date  . Hypertension   . Asthma   . Seizures   . Heart murmur     born with   . Dysrhythmia     skipped beats  . Myocardial infarction     hi of x 2  last one 2003   . COPD (chronic obstructive pulmonary disease)     chronic bronchitis   . Pneumonia     hx of 2009  . GERD (gastroesophageal reflux disease)   . Headache(784.0)     migraines   . Neuropathy     due to diabetes   . Dizziness     secondary to diabetes and hypertension   . Arthritis     joint pain   . Hx of blood clots     hematomas removed from left side of brain from 41mo to 89yrold   . Epilepsy idiopathic petit mal     last seizure 2012;controlled w/ topomax  . Diabetic neuropathy, painful     FEET AND HANDS  . Diabetes mellitus     since age [age]type 2 IDDM  . Sleep apnea     sleep study 2010 @ [facility];does not use Cpap ; mild  . MIGRAINE  HEADACHE 09/14/2006    Qualifier: Diagnosis of  By: MaHassell DoneNP, NyTori Milks  . Glaucoma     NOT ON ANY EYE DROPS   . Legally blind     left eye   . Stroke     last 2003  RESIDUAL LEFT LEG WEAKNESS--NO OTHER RESIDUAL PROBLEMS  . CVA 11/14/2007    Qualifier: Diagnosis of  By: MaHassell DoneNP, NyTori Milks  . Pain 09/23/11    LOWER BACK AND RIGHT SIDE--PT HAS RIGHT URETERAL STONE  . Nephrolithiasis     frequent urination , urination at nite  PT SEEN IN ER 09/22/11 FOR BACK AND RT SIDED PAIN--HAS KNOWN STONE RT URETER AND UA IN ER SHOWED UTI    Current Outpatient Prescriptions  Medication Sig Dispense Refill  . albuterol (PROVENTIL HFA;VENTOLIN HFA) 108 (90 BASE) MCG/ACT inhaler Inhale 2 puffs into the lungs every 6 (six) hours as needed for wheezing or shortness of breath. Wheezing  6.7 g  3  . aspirin 81 MG tablet Take 1  tablet (81 mg total) by mouth daily.  30 tablet  3  . baclofen (LIORESAL) 10 MG tablet TAKE 1 TABLET BY MOUTH THREE TIMES A DAY for muscle spasms  90 each  3  . budesonide-formoterol (SYMBICORT) 160-4.5 MCG/ACT inhaler Inhale 2 puffs into the lungs 2 (two) times daily.  1 Inhaler  3  . cetirizine (ZYRTEC) 10 MG tablet Take 10 mg by mouth 2 (two) times daily. For allergies      . clopidogrel (PLAVIX) 75 MG tablet Take 1 tablet (75 mg total) by mouth daily.  30 tablet  3  . diclofenac (VOLTAREN) 75 MG EC tablet TAKE 1 TABLET BY MOUTH TWICE DAILY WITH FOOD AS NEEDED, for arthritis      . EASY TOUCH PEN NEEDLES 31G X 8 MM MISC USE AS DIRECTED TO INJECT INSULIN DAILY  100 each  0  . FLUoxetine (PROZAC) 20 MG capsule Take 3 capsules (60 mg total) by mouth daily at 12 noon.  90 capsule  3  . fluticasone (FLONASE) 50 MCG/ACT nasal spray USE 2 SPRAYS IN EACH NOSTRIL EVERY DAY  16 g  0  . gabapentin (NEURONTIN) 300 MG capsule TAKE 2 CAPSULE BY MOUTH THREE TIMES A DAY  180 capsule  3  . GLIPIZIDE XL 10 MG 24 hr tablet TAKE 2 TABLETS BY MOUTH EVERY DAY WITH BREAKFAST  60 tablet  0  . Insulin  Detemir (LEVEMIR FLEXPEN) 100 UNIT/ML Pen 40 units every morning and 40 units every night. For Diabetes  15 mL  3  . nystatin-triamcinolone ointment (MYCOLOG) Apply 1 application topically 2 (two) times daily.  30 g  0  . omeprazole (PRILOSEC) 20 MG capsule Take 1 capsule (20 mg total) by mouth daily. For Hernia and acid reflux  30 capsule  3  . ONETOUCH DELICA LANCETS 76H MISC TEST BLOOD SUGAR FOUR TIMES A DAY  200 each  0  . promethazine (PHENERGAN) 25 MG tablet Take 1 tablet (25 mg total) by mouth every 6 (six) hours as needed for nausea.  20 tablet  0  . ranitidine (ZANTAC) 150 MG tablet Take 1 tablet (150 mg total) by mouth 2 (two) times daily. For Gastritis  60 tablet  3  . simvastatin (ZOCOR) 20 MG tablet Take 1 tablet (20 mg total) by mouth daily. TAKES IN AM, for cholesterol  30 tablet  3  . topiramate (TOPAMAX) 200 MG tablet TAKE 1 TABLET BY MOUTH TWICE DAILY  60 tablet  3  . traZODone (DESYREL) 150 MG tablet Take 1 tablet (150 mg total) by mouth at bedtime. For Sleep  30 tablet  1  . verapamil (VERELAN PM) 240 MG 24 hr capsule TAKE 1 CAPSULE BY MOUTH EVERY NIGHT AT BEDTIME for blood pressure  30 capsule  3  . Vitamin D, Ergocalciferol, (DRISDOL) 50000 UNITS CAPS capsule Take 50,000 Units by mouth every 7 (seven) days. Thursday      . zolpidem (AMBIEN) 5 MG tablet Take 1 tablet (5 mg total) by mouth at bedtime as needed for sleep.  30 tablet  5   No current facility-administered medications for this visit.    Allergies:    Allergies  Allergen Reactions  . Codeine Swelling and Other (See Comments)    Swelling and burning of mouth (inside)  . Lactose Intolerance (Gi)     Social History:  The patient  reports that she quit smoking about 26 years ago. Her smoking use included Cigarettes. She has a 30 pack-year smoking history.  She has never used smokeless tobacco. She reports that she does not drink alcohol or use illicit drugs.   Family History:  The patient's family history includes  Cancer in her maternal grandmother and mother; Hypertension in her maternal grandmother and mother. There is no history of Anesthesia problems.   ROS:  Please see the history of present illness.   \   All other systems reviewed and negative.   PHYSICAL EXAM: VS:  BP 130/74  Pulse 81  Ht 5' 5"  (1.651 m)  Wt 239 lb (108.41 kg)  BMI 39.77 kg/m2 Well nourished, well developed, in no acute distress HEENT: normal Neck: no JVD Cardiac:  normal S1, S2; RRR; no murmur Lungs:  clear to auscultation bilaterally, no wheezing, rhonchi or rales Abd: soft, nontender, no hepatomegaly Ext: no edema Skin: warm and dry Neuro:  CNs 2-12 intact, no focal abnormalities noted  EKG:  NSR with no ST changes     ASSESSMENT AND PLAN:  1. ? Remote history of MI in setting of CVA at [facility] but no cardiac workup with cath was done - check 2D echo to assess for wall motion abnormalities - she is not having any chest pain and her SOB has improved on inhalers.  I will try to get old records from [facility] to research whether she had a CVA and what tests were done before ordering any further test. 2. SOB secondary to asthma and improved with inhalers.   Followup PRN based on results of echo and review of old records  Signed, Fransico Him, MD 06/05/2013 2:56 PM

## 2013-06-05 NOTE — Progress Notes (Signed)
[place], [place] [place], [place]  [address] Phone: [phone] Fax:  [phone]  Date:  06/05/2013   ID:  [name], DOB February 21, 1965, MRN [id number]  PCP:  Delrae Rend, NP  Cardiologist:  Fransico Him, MD     History of Present Illness: [name] is a 48 y.o. female with a history of HTN, heart murmur at birth.  She was evaluated by Cardiology in 2013 for post op tachycardia that was felt related to physiologic stress with ileus and abdominal pain.  She carries a diagnosis of MI x2 remotely with last being in 2003 in setting of her CVA but I do not have any documentation of this and she never had a cardia workup done. This was apparently at [facility].  She also has a history of DM and COPD presents today for cardiac evaluation.  She denies any chest pain but has been having SOB felt due to her asthma.  She denies any palpitations, LE edema, dizziness or syncope.  Wt Readings from Last 3 Encounters:  06/05/13 239 lb (108.41 kg)  05/23/13 246 lb (111.585 kg)  05/11/13 242 lb 6.4 oz (109.952 kg)     Past Medical History  Diagnosis Date  . Hypertension   . Asthma   . Seizures   . Heart murmur     born with   . Dysrhythmia     skipped beats  . Myocardial infarction     hi of x 2  last one 2003   . COPD (chronic obstructive pulmonary disease)     chronic bronchitis   . Pneumonia     hx of 2009  . GERD (gastroesophageal reflux disease)   . Headache(784.0)     migraines   . Neuropathy     due to diabetes   . Dizziness     secondary to diabetes and hypertension   . Arthritis     joint pain   . Hx of blood clots     hematomas removed from left side of brain from 57mo to 89yrold   . Epilepsy idiopathic petit mal     last seizure 2012;controlled w/ topomax  . Diabetic neuropathy, painful     FEET AND HANDS  . Diabetes mellitus     since age [age]type 2 IDDM  . Sleep apnea     sleep study 2010 @ [facility];does not use Cpap ; mild  . MIGRAINE  HEADACHE 09/14/2006    Qualifier: Diagnosis of  By: MaHassell DoneNP, NyTori Milks  . Glaucoma     NOT ON ANY EYE DROPS   . Legally blind     left eye   . Stroke     last 2003  RESIDUAL LEFT LEG WEAKNESS--NO OTHER RESIDUAL PROBLEMS  . CVA 11/14/2007    Qualifier: Diagnosis of  By: MaHassell DoneNP, NyTori Milks  . Pain 09/23/11    LOWER BACK AND RIGHT SIDE--PT HAS RIGHT URETERAL STONE  . Nephrolithiasis     frequent urination , urination at nite  PT SEEN IN ER 09/22/11 FOR BACK AND RT SIDED PAIN--HAS KNOWN STONE RT URETER AND UA IN ER SHOWED UTI    Current Outpatient Prescriptions  Medication Sig Dispense Refill  . albuterol (PROVENTIL HFA;VENTOLIN HFA) 108 (90 BASE) MCG/ACT inhaler Inhale 2 puffs into the lungs every 6 (six) hours as needed for wheezing or shortness of breath. Wheezing  6.7 g  3  . aspirin 81 MG tablet Take  1 tablet (81 mg total) by mouth daily.  30 tablet  3  . baclofen (LIORESAL) 10 MG tablet TAKE 1 TABLET BY MOUTH THREE TIMES A DAY for muscle spasms  90 each  3  . budesonide-formoterol (SYMBICORT) 160-4.5 MCG/ACT inhaler Inhale 2 puffs into the lungs 2 (two) times daily.  1 Inhaler  3  . cetirizine (ZYRTEC) 10 MG tablet Take 10 mg by mouth 2 (two) times daily. For allergies      . clopidogrel (PLAVIX) 75 MG tablet Take 1 tablet (75 mg total) by mouth daily.  30 tablet  3  . diclofenac (VOLTAREN) 75 MG EC tablet TAKE 1 TABLET BY MOUTH TWICE DAILY WITH FOOD AS NEEDED, for arthritis      . EASY TOUCH PEN NEEDLES 31G X 8 MM MISC USE AS DIRECTED TO INJECT INSULIN DAILY  100 each  0  . FLUoxetine (PROZAC) 20 MG capsule Take 3 capsules (60 mg total) by mouth daily at 12 noon.  90 capsule  3  . fluticasone (FLONASE) 50 MCG/ACT nasal spray USE 2 SPRAYS IN EACH NOSTRIL EVERY DAY  16 g  0  . gabapentin (NEURONTIN) 300 MG capsule TAKE 2 CAPSULE BY MOUTH THREE TIMES A DAY  180 capsule  3  . GLIPIZIDE XL 10 MG 24 hr tablet TAKE 2 TABLETS BY MOUTH EVERY DAY WITH BREAKFAST  60 tablet  0  . Insulin  Detemir (LEVEMIR FLEXPEN) 100 UNIT/ML Pen 40 units every morning and 40 units every night. For Diabetes  15 mL  3  . nystatin-triamcinolone ointment (MYCOLOG) Apply 1 application topically 2 (two) times daily.  30 g  0  . omeprazole (PRILOSEC) 20 MG capsule Take 1 capsule (20 mg total) by mouth daily. For Hernia and acid reflux  30 capsule  3  . ONETOUCH DELICA LANCETS 68T MISC TEST BLOOD SUGAR FOUR TIMES A DAY  200 each  0  . promethazine (PHENERGAN) 25 MG tablet Take 1 tablet (25 mg total) by mouth every 6 (six) hours as needed for nausea.  20 tablet  0  . ranitidine (ZANTAC) 150 MG tablet Take 1 tablet (150 mg total) by mouth 2 (two) times daily. For Gastritis  60 tablet  3  . simvastatin (ZOCOR) 20 MG tablet Take 1 tablet (20 mg total) by mouth daily. TAKES IN AM, for cholesterol  30 tablet  3  . topiramate (TOPAMAX) 200 MG tablet TAKE 1 TABLET BY MOUTH TWICE DAILY  60 tablet  3  . traZODone (DESYREL) 150 MG tablet Take 1 tablet (150 mg total) by mouth at bedtime. For Sleep  30 tablet  1  . verapamil (VERELAN PM) 240 MG 24 hr capsule TAKE 1 CAPSULE BY MOUTH EVERY NIGHT AT BEDTIME for blood pressure  30 capsule  3  . Vitamin D, Ergocalciferol, (DRISDOL) 50000 UNITS CAPS capsule Take 50,000 Units by mouth every 7 (seven) days. Thursday      . zolpidem (AMBIEN) 5 MG tablet Take 1 tablet (5 mg total) by mouth at bedtime as needed for sleep.  30 tablet  5   No current facility-administered medications for this visit.    Allergies:    Allergies  Allergen Reactions  . Codeine Swelling and Other (See Comments)    Swelling and burning of mouth (inside)  . Lactose Intolerance (Gi)     Social History:  The patient  reports that she quit smoking about 26 years ago. Her smoking use included Cigarettes. She has a 30 pack-year smoking  history. She has never used smokeless tobacco. She reports that she does not drink alcohol or use illicit drugs.   Family History:  The patient's family history includes  Cancer in her maternal grandmother and mother; Hypertension in her maternal grandmother and mother. There is no history of Anesthesia problems.   ROS:  Please see the history of present illness.   \   All other systems reviewed and negative.   PHYSICAL EXAM: VS:  BP 130/74  Pulse 81  Ht 5' 5"  (1.651 m)  Wt 239 lb (108.41 kg)  BMI 39.77 kg/m2 Well nourished, well developed, in no acute distress HEENT: normal Neck: no JVD Cardiac:  normal S1, S2; RRR; no murmur Lungs:  clear to auscultation bilaterally, no wheezing, rhonchi or rales Abd: soft, nontender, no hepatomegaly Ext: no edema Skin: warm and dry Neuro:  CNs 2-12 intact, no focal abnormalities noted  EKG:  NSR with no ST changes     ASSESSMENT AND PLAN:  1. ? Remote history of MI in setting of CVA at [facility] but no cardiac workup with cath was done - check 2D echo to assess for wall motion abnormalities - she is not having any chest pain and her SOB has improved on inhalers.  I will try to get old records from [facility] to research whether she had a CVA and what tests were done before ordering any further test. 2. SOB secondary to asthma and improved with inhalers.   Followup PRN based on results of echo and review of old records  Signed, Fransico Him, MD 06/05/2013 2:56 PM

## 2013-06-06 ENCOUNTER — Encounter: Payer: Self-pay | Admitting: Pulmonary Disease

## 2013-06-06 ENCOUNTER — Ambulatory Visit (INDEPENDENT_AMBULATORY_CARE_PROVIDER_SITE_OTHER): Payer: Medicare Other | Admitting: Pulmonary Disease

## 2013-06-06 ENCOUNTER — Ambulatory Visit (INDEPENDENT_AMBULATORY_CARE_PROVIDER_SITE_OTHER)
Admission: RE | Admit: 2013-06-06 | Discharge: 2013-06-06 | Disposition: A | Discharge status: Home / Self Care | Payer: Medicare Other | Source: Ambulatory Visit | Attending: Pulmonary Disease | Admitting: Pulmonary Disease

## 2013-06-06 VITALS — BP 136/82 | HR 73 | Temp 98.4°F | Ht 66.0 in | Wt 245.0 lb

## 2013-06-06 DIAGNOSIS — R0600 Dyspnea, unspecified: Secondary | ICD-10-CM

## 2013-06-06 DIAGNOSIS — R0989 Other specified symptoms and signs involving the circulatory and respiratory systems: Secondary | ICD-10-CM

## 2013-06-06 DIAGNOSIS — R0609 Other forms of dyspnea: Secondary | ICD-10-CM

## 2013-06-06 LAB — COMPREHENSIVE METABOLIC PANEL
ALT: 13 IU/L (ref 0–32)
AST: 13 IU/L (ref 0–40)
Albumin/Globulin Ratio: 1.4 (ref 1.1–2.5)
Albumin: 4.2 g/dL (ref 3.5–5.5)
Alkaline Phosphatase: 93 IU/L (ref 39–117)
BUN/Creatinine Ratio: 18 (ref 9–23)
BUN: 15 mg/dL (ref 6–24)
CO2: 18 mmol/L (ref 18–29)
Calcium: 9.7 mg/dL (ref 8.7–10.2)
Chloride: 104 mmol/L (ref 97–108)
Creatinine, Ser: 0.84 mg/dL (ref 0.57–1.00)
GFR calc Af Amer: 95 mL/min/{1.73_m2} (ref >59)
GFR calc non Af Amer: 82 mL/min/{1.73_m2} (ref >59)
Globulin, Total: 3 g/dL (ref 1.5–4.5)
Glucose: 291 mg/dL — ABNORMAL HIGH (ref 65–99)
Potassium: 4.8 mmol/L (ref 3.5–5.2)
Sodium: 140 mmol/L (ref 134–144)
Total Bilirubin: 0.2 mg/dL (ref 0.0–1.2)
Total Protein: 7.2 g/dL (ref 6.0–8.5)

## 2013-06-06 LAB — CBC WITH DIFFERENTIAL
Basophils Absolute: 0 10*3/uL (ref 0.0–0.2)
Basos: 0 %
Eos: 0 %
Eosinophils Absolute: 0 10*3/uL (ref 0.0–0.4)
HCT: 42.2 % (ref 34.0–46.6)
Hemoglobin: 13.2 g/dL (ref 11.1–15.9)
Immature Grans (Abs): 0 10*3/uL (ref 0.0–0.1)
Immature Granulocytes: 0 %
Lymphocytes Absolute: 1.7 10*3/uL (ref 0.7–3.1)
Lymphs: 28 %
MCH: 29.2 pg (ref 26.6–33.0)
MCHC: 31.3 g/dL — ABNORMAL LOW (ref 31.5–35.7)
MCV: 93 fL (ref 79–97)
Monocytes Absolute: 0.5 10*3/uL (ref 0.1–0.9)
Monocytes: 8 %
Neutrophils Absolute: 3.9 10*3/uL (ref 1.4–7.0)
Neutrophils Relative %: 64 %
Platelets: 276 10*3/uL (ref 150–379)
RBC: 4.52 x10E6/uL (ref 3.77–5.28)
RDW: 13.2 % (ref 12.3–15.4)
WBC: 6.2 10*3/uL (ref 3.4–10.8)

## 2013-06-06 LAB — LIPID PANEL
Chol/HDL Ratio: 3.8 ratio units (ref 0.0–4.4)
Cholesterol, Total: 173 mg/dL (ref 100–199)
HDL: 46 mg/dL (ref >39)
LDL Calculated: 105 mg/dL — ABNORMAL HIGH (ref 0–99)
Triglycerides: 110 mg/dL (ref 0–149)
VLDL Cholesterol Cal: 22 mg/dL (ref 5–40)

## 2013-06-06 LAB — MICROALBUMIN, URINE: Microalbumin, Urine: 32.7 ug/mL — ABNORMAL HIGH (ref 0.0–17.0)

## 2013-06-06 LAB — HEMOGLOBIN A1C
Est. average glucose Bld gHb Est-mCnc: 312 mg/dL
Hgb A1c MFr Bld: 12.5 % — ABNORMAL HIGH (ref 4.8–5.6)

## 2013-06-06 NOTE — Patient Instructions (Signed)
Continue on your symbicort with albuterol as needed. Will check a chest xray today, and call you with results Will schedule you for breathing studies, and will call you for followup once the results are available

## 2013-06-06 NOTE — Progress Notes (Signed)
   Subjective:    Patient ID: [name], female    DOB: 1965-12-14, 48 y.o.   MRN: [id number]  HPI The patient is a 48 year old female who I've been asked to see for management of asthma. Patient has a long history of smoking, but was told that she had asthma approximately 4-5 years ago. This apparently was diagnosed at [facility], but no records are available. She has been maintained on Symbicort, along with albuterol for rescue. The patient states that she uses her albuterol for rescue approximately 2 times per week, and that she has never ever been on prednisone for breathing issues. She describes a less than one block dyspnea on exertion at a moderate pace, as well as walking up one flight of stairs. She will also get winded bringing groceries in from the car. She denies any significant cough or mucus production. She denies lower extremity edema, and currently is being evaluated by cardiology for possible contribution to her shortness of breath. The patient states that her weight is down 100 pounds from one year ago, but I have no way of verifying this. She has had no recent chest x-ray or pulmonary function studies.   Review of Systems  Constitutional: Negative for fever and unexpected weight change.  HENT: Positive for congestion. Negative for dental problem, ear pain, nosebleeds, postnasal drip, rhinorrhea, sinus pressure, sneezing, sore throat and trouble swallowing.   Eyes: Negative for redness and itching.  Respiratory: Positive for cough and shortness of breath. Negative for chest tightness and wheezing.   Cardiovascular: Negative for palpitations and leg swelling.  Gastrointestinal: Negative for nausea and vomiting.  Genitourinary: Negative for dysuria.  Musculoskeletal: Negative for joint swelling.  Skin: Negative for rash.  Neurological: Positive for headaches.  Hematological: Does not bruise/bleed easily.  Psychiatric/Behavioral: Negative for dysphoric mood. The patient  is not nervous/anxious.        Objective:   Physical Exam Constitutional:  Obese female, no acute distress  HENT:  Nares patent without discharge  Oropharynx without exudate, palate and uvula are mildly elongated.   Eyes:  Perrla, eomi, no scleral icterus  Neck:  No JVD, no TMG  Cardiovascular:  Normal rate, regular rhythm, no rubs or gallops.  No murmurs        Intact distal pulses but diminished  Pulmonary :  Normal breath sounds, no stridor or respiratory distress   No rales, rhonchi, or wheezing  Abdominal:  Soft, nondistended, bowel sounds present.  No tenderness noted.   Musculoskeletal:  1+ lower extremity edema noted.  Lymph Nodes:  No cervical lymphadenopathy noted  Skin:  No cyanosis noted  Neurologic:  Alert, appropriate, moves all 4 extremities without obvious deficit.         Assessment & Plan:

## 2013-06-06 NOTE — Assessment & Plan Note (Signed)
The patient has significant dyspnea on exertion, and tells me that she has been diagnosed with asthma in the past. She does have a history of smoking, and I wonder if this is COPD more so than true asthma. I will schedule her for full pulmonary function studies for evaluation, and continue her on current bronchodilator regimen.  She tells me that she has "flares", but that she has never been on prednisone??  She obviously has other reasons for dyspnea on exertion, including her morbid obesity and deconditioning. She is also being evaluated from a cardiac standpoint. I will see the patient back after her pulmonary function studies.

## 2013-06-07 ENCOUNTER — Ambulatory Visit (INDEPENDENT_AMBULATORY_CARE_PROVIDER_SITE_OTHER): Payer: PRIVATE HEALTH INSURANCE | Admitting: Nurse Practitioner

## 2013-06-07 ENCOUNTER — Encounter: Payer: Self-pay | Admitting: Nurse Practitioner

## 2013-06-07 VITALS — BP 132/82 | HR 80 | Temp 99.0°F | Resp 18 | Ht 66.0 in | Wt 242.0 lb

## 2013-06-07 DIAGNOSIS — R0989 Other specified symptoms and signs involving the circulatory and respiratory systems: Secondary | ICD-10-CM

## 2013-06-07 DIAGNOSIS — F32A Depression, unspecified: Secondary | ICD-10-CM

## 2013-06-07 DIAGNOSIS — E785 Hyperlipidemia, unspecified: Secondary | ICD-10-CM

## 2013-06-07 DIAGNOSIS — G4700 Insomnia, unspecified: Secondary | ICD-10-CM

## 2013-06-07 DIAGNOSIS — N318 Other neuromuscular dysfunction of bladder: Secondary | ICD-10-CM

## 2013-06-07 DIAGNOSIS — IMO0002 Reserved for concepts with insufficient information to code with codable children: Secondary | ICD-10-CM

## 2013-06-07 DIAGNOSIS — F329 Major depressive disorder, single episode, unspecified: Secondary | ICD-10-CM

## 2013-06-07 DIAGNOSIS — R0600 Dyspnea, unspecified: Secondary | ICD-10-CM

## 2013-06-07 DIAGNOSIS — I10 Essential (primary) hypertension: Secondary | ICD-10-CM

## 2013-06-07 DIAGNOSIS — F419 Anxiety disorder, unspecified: Secondary | ICD-10-CM

## 2013-06-07 DIAGNOSIS — E1165 Type 2 diabetes mellitus with hyperglycemia: Principal | ICD-10-CM

## 2013-06-07 DIAGNOSIS — K219 Gastro-esophageal reflux disease without esophagitis: Secondary | ICD-10-CM

## 2013-06-07 DIAGNOSIS — N3281 Overactive bladder: Secondary | ICD-10-CM

## 2013-06-07 DIAGNOSIS — R0609 Other forms of dyspnea: Secondary | ICD-10-CM

## 2013-06-07 DIAGNOSIS — Z139 Encounter for screening, unspecified: Secondary | ICD-10-CM

## 2013-06-07 DIAGNOSIS — F341 Dysthymic disorder: Secondary | ICD-10-CM

## 2013-06-07 DIAGNOSIS — E1129 Type 2 diabetes mellitus with other diabetic kidney complication: Secondary | ICD-10-CM

## 2013-06-07 DIAGNOSIS — R809 Proteinuria, unspecified: Secondary | ICD-10-CM

## 2013-06-07 MED ORDER — SOLIFENACIN SUCCINATE 5 MG PO TABS: ORAL_TABLET | ORAL | Status: DC

## 2013-06-07 MED ORDER — INSULIN ASPART 100 UNIT/ML ~~LOC~~ SOLN: SUBCUTANEOUS | Status: DC

## 2013-06-07 MED ORDER — LISINOPRIL 10 MG PO TABS: 10.0000 mg | ORAL_TABLET | Freq: Every day | ORAL | Status: DC

## 2013-06-07 MED ORDER — INSULIN DETEMIR 100 UNIT/ML FLEXPEN: PEN_INJECTOR | SUBCUTANEOUS | Status: DC

## 2013-06-07 NOTE — Patient Instructions (Addendum)
Will change insuline To take levemir 25 units twice daily Take blood sugars 3 times daily before meals Start novolog- to take 5 units with meals for blood sugars 150-300 , take 8 units for blood sugars over 300  Please make appt to see Tye Maryland on next Monday   Will start you on lisinopril for blood pressure and to protect the kidneys   BRING TO VISIT with Tye Maryland  To start a diary and writ down EVERYTHING you eat or drink Also write down all blood sugars and the amount of insulin you give yourself

## 2013-06-07 NOTE — Progress Notes (Signed)
Patient ID: [name], female   DOB: [birth year], 48 y.o.   MRN: [id number]    Allergies  Allergen Reactions  . Codeine Swelling and Other (See Comments)    Swelling and burning of mouth (inside)  . Lactose Intolerance (Gi)     Chief Complaint  Patient presents with  . Annual Exam    HPI: Patient is a 48 y.o. female seen in the office today for extended visit but does not wish to have PAP today Labs reviewed and discussed with pt Was seen for follow up with Dr Bubba Camp and blood sugars has not changed even though she went up by 10 units of levemir Fasting blood sugars are 277-300s Taking levemir and glipizide- takes as prescribed does not miss doses Reports she is eating a healthy diet, no sodas, water, no sweets -baking food  exercising more  Saw pulmonary yesterday- scheduled PFTs- chest xray was good Saw cardiology echo being scheduled Rescheduling with GI Needs mammogram and pap Reports OAB- but insurance not covering medication and she has not been taking it, increased incontinence  Review of Systems:  Review of Systems  Constitutional: Positive for weight loss (trying to lose weight ).  HENT: Negative for congestion, ear discharge, ear pain, hearing loss, nosebleeds and tinnitus.   Eyes: Positive for blurred vision.       Glaucoma Going to see eye MD on sat  Respiratory: Positive for cough (ongoing) and shortness of breath (with increased activity ).        Hx of sleep apnea but does not have machine, sleep study in feb 2015 by [facility]  Cardiovascular: Negative for chest pain, palpitations and leg swelling.  Gastrointestinal: Negative for heartburn, nausea, abdominal pain, diarrhea and constipation.  Genitourinary: Positive for frequency (with incontience). Negative for dysuria and urgency.  Musculoskeletal: Positive for back pain, joint pain (knees) and myalgias. Negative for falls.  Skin: Negative for itching and rash.  Neurological: Positive for weakness and  headaches (migraines ). Negative for dizziness, tingling, sensory change and seizures (no seizures since 2014).  Endo/Heme/Allergies: Positive for environmental allergies.  Psychiatric/Behavioral: Positive for depression. Negative for suicidal ideas, hallucinations and substance abuse. The patient is nervous/anxious and has insomnia.        Anxiety and depression controlled on medication     Past Medical History  Diagnosis Date  . Hypertension   . Asthma   . Seizures   . Heart murmur     born with   . Dysrhythmia     skipped beats  . Myocardial infarction     hi of x 2  last one 2003   . COPD (chronic obstructive pulmonary disease)     chronic bronchitis   . Pneumonia     hx of 2009  . GERD (gastroesophageal reflux disease)   . Headache(784.0)     migraines   . Neuropathy     due to diabetes   . Dizziness     secondary to diabetes and hypertension   . Arthritis     joint pain   . Hx of blood clots     hematomas removed from left side of brain from 88mo to 55yrold   . Epilepsy idiopathic petit mal     last seizure 2012;controlled w/ topomax  . Diabetic neuropathy, painful     FEET AND HANDS  . Diabetes mellitus     since age [age]type 2 IDDM  . Sleep apnea     [facility] 2010 [facility];does not use Cpap ; mild  . MIGRAINE HEADACHE 09/14/2006    Qualifier: Diagnosis of  By: Hassell Done [name], Tori Milks    . Glaucoma     NOT ON ANY EYE DROPS   . Legally blind     left eye   . Stroke     last 2003  RESIDUAL LEFT LEG WEAKNESS--NO OTHER RESIDUAL PROBLEMS  . CVA 11/14/2007    Qualifier: Diagnosis of  By: Hassell Done [name], Tori Milks    . Pain 09/23/11    LOWER BACK AND RIGHT SIDE--PT HAS RIGHT URETERAL STONE  . Nephrolithiasis     frequent urination , urination at nite  PT SEEN IN ER 09/22/11 FOR BACK AND RT SIDED PAIN--HAS KNOWN STONE RT URETER AND UA IN ER SHOWED UTI   Past Surgical History  Procedure Laterality Date  . Kidney stone removal    . Brain hematoma evacutation       five procedures total, first procedure when 31 months old, last at 48 years of age.  . Shunt removal      shunt inserted at age 55 removed at age 78   . Eye surgery    . Retinal detachment surgery  1990  . Vaginal hysterectomy  1996  . Incisional hernia repair  05/05/2011    Procedure: LAPAROSCOPIC INCISIONAL HERNIA;  Surgeon: Rolm Bookbinder, MD;  Location: [facility];  Service: General;  Laterality: N/A;  . Mass excision  05/05/2011    Procedure: EXCISION MASS;  Surgeon: Rolm Bookbinder, MD;  Location: [facility];  Service: General;  Laterality: Right;  . Hernia repair  05/05/11    lap incisional hernia  . Pcnl    . Cystoscopy/retrograde/ureteroscopy  08/24/2011    Procedure: CYSTOSCOPY/RETROGRADE/URETEROSCOPY;  Surgeon: Molli Hazard, MD;  Location: [facility];  Service: Urology;  Laterality: Right;  Cysto, Right retrograde Pyelogram, right stent placement.   . Cystoscopy with ureteroscopy  09/24/2011    Procedure: CYSTOSCOPY WITH URETEROSCOPY;  Surgeon: Malka So, MD;  Location: [facility];  Service: Urology;  Laterality: Right;  Balloon dilation right ureter   . Cystoscopy w/ retrogrades  09/24/2011    Procedure: CYSTOSCOPY WITH RETROGRADE PYELOGRAM;  Surgeon: Malka So, MD;  Location: [facility];  Service: Urology;  Laterality: Right;   Social History:   reports that she quit smoking about 26 years ago. Her smoking use included Cigarettes. She has a 30 pack-year smoking history. She has never used smokeless tobacco. She reports that she does not drink alcohol or use illicit drugs.  Family History  Problem Relation Age of Onset  . Cancer Mother     uterine  . Hypertension Mother   . Cancer Maternal Grandmother     brain  . Hypertension Maternal Grandmother   . Anesthesia problems Neg Hx     Medications: Patient's Medications  New Prescriptions   No medications on file  Previous Medications   ALBUTEROL (PROVENTIL HFA;VENTOLIN HFA) 108 (90 BASE) MCG/ACT INHALER    Inhale 2 puffs into the  lungs every 6 (six) hours as needed for wheezing or shortness of breath. Wheezing   ASPIRIN 81 MG TABLET    Take 1 tablet (81 mg total) by mouth daily.   BACLOFEN (LIORESAL) 10 MG TABLET    TAKE 1 TABLET BY MOUTH THREE TIMES A DAY for muscle spasms   BUDESONIDE-FORMOTEROL (SYMBICORT) 160-4.5 MCG/ACT INHALER    Inhale 2 puffs into the lungs 2 (two) times daily.   CETIRIZINE (ZYRTEC) 10 MG TABLET  Take 10 mg by mouth 2 (two) times daily. For allergies   CLOPIDOGREL (PLAVIX) 75 MG TABLET    Take 1 tablet (75 mg total) by mouth daily.   DICLOFENAC (VOLTAREN) 75 MG EC TABLET    TAKE 1 TABLET BY MOUTH TWICE DAILY WITH FOOD AS NEEDED, for arthritis   EASY TOUCH PEN NEEDLES 31G X 8 MM MISC    USE AS DIRECTED TO INJECT INSULIN DAILY   FLUOXETINE (PROZAC) 20 MG CAPSULE    Take 3 capsules (60 mg total) by mouth daily at 12 noon.   FLUTICASONE (FLONASE) 50 MCG/ACT NASAL SPRAY    USE 2 SPRAYS IN EACH NOSTRIL EVERY DAY   GABAPENTIN (NEURONTIN) 300 MG CAPSULE    TAKE 2 CAPSULE BY MOUTH THREE TIMES A DAY   GLIPIZIDE XL 10 MG 24 HR TABLET    TAKE 2 TABLETS BY MOUTH EVERY DAY WITH BREAKFAST   INSULIN DETEMIR (LEVEMIR FLEXPEN) 100 UNIT/ML PEN    40 units every morning and 40 units every night. For Diabetes   NYSTATIN-TRIAMCINOLONE OINTMENT (MYCOLOG)    Apply 1 application topically 2 (two) times daily.   OMEPRAZOLE (PRILOSEC) 20 MG CAPSULE    Take 1 capsule (20 mg total) by mouth daily. For Hernia and acid reflux   ONETOUCH DELICA LANCETS 14G MISC    TEST BLOOD SUGAR FOUR TIMES A DAY   PROMETHAZINE (PHENERGAN) 25 MG TABLET    Take 1 tablet (25 mg total) by mouth every 6 (six) hours as needed for nausea.   RANITIDINE (ZANTAC) 150 MG TABLET    Take 1 tablet (150 mg total) by mouth 2 (two) times daily. For Gastritis   SIMVASTATIN (ZOCOR) 20 MG TABLET    Take 1 tablet (20 mg total) by mouth daily. TAKES IN AM, for cholesterol   TOPIRAMATE (TOPAMAX) 200 MG TABLET    TAKE 1 TABLET BY MOUTH TWICE DAILY   TRAZODONE  (DESYREL) 150 MG TABLET    Take 1 tablet (150 mg total) by mouth at bedtime. For Sleep   VERAPAMIL (VERELAN PM) 240 MG 24 HR CAPSULE    TAKE 1 CAPSULE BY MOUTH EVERY NIGHT AT BEDTIME for blood pressure   VITAMIN D, ERGOCALCIFEROL, (DRISDOL) 50000 UNITS CAPS CAPSULE    Take 50,000 Units by mouth every 7 (seven) days. Thursday   ZOLPIDEM (AMBIEN) 5 MG TABLET    Take 1 tablet (5 mg total) by mouth at bedtime as needed for sleep.  Modified Medications   No medications on file  Discontinued Medications   No medications on file     Physical Exam:  Filed Vitals:   06/07/13 1110  BP: 132/82  Pulse: 80  Temp: 99 F (37.2 C)  TempSrc: Oral  Resp: 18  Height: 5' 6"  (1.676 m)  Weight: 242 lb (109.77 kg)  SpO2: 97%    Physical Exam  Constitutional: She is oriented to person, place, and time and well-developed, well-nourished, and in no distress.  HENT:  Head: Normocephalic and atraumatic.  Nose: Nose normal.  Mouth/Throat: Oropharynx is clear and moist. No oropharyngeal exudate.  Neck: Normal range of motion. Neck supple. No thyromegaly present.  Cardiovascular: Normal rate, regular rhythm, normal heart sounds and intact distal pulses.   Pulmonary/Chest: Effort normal and breath sounds normal. No respiratory distress.  Abdominal: Soft. Bowel sounds are normal. She exhibits no distension.  Genitourinary:  Does not want PAP done today- will reschedule  Musculoskeletal: Normal range of motion. She exhibits no edema and no tenderness.  Neurological: She is alert and oriented to person, place, and time.  Skin: Skin is warm and dry.  Psychiatric: She has a flat affect.     Labs reviewed: Basic Metabolic Panel:  Recent Labs  02/06/13 1810 05/23/13 0914 06/05/13 0854  NA 138 137 140  K 3.8 4.0 4.8  CL 105 99 104  CO2 21 18 18   GLUCOSE 197* 349* 291*  BUN 15 15 15   CREATININE 0.87 0.87 0.84  CALCIUM 8.9 9.3 9.7   Liver Function Tests:  Recent Labs  02/01/13 1407  02/06/13 1810 05/23/13 0914 06/05/13 0854  AST 12 10 8 13   ALT 13 12 14 13   ALKPHOS 86 75 99 93  BILITOT 0.2* <0.2* <0.2 0.2  PROT 8.1 7.3 6.9 7.2  ALBUMIN 3.8 3.5  --   --     Recent Labs  02/06/13 1810  LIPASE 29   No results found for this basename: AMMONIA,  in the last 8760 hours CBC:  Recent Labs  02/01/13 1407 02/06/13 1810 06/05/13 0854  WBC 8.2 7.9 6.2  NEUTROABS 4.9  --  3.9  HGB 13.3 11.9* 13.2  HCT 39.9 36.7 42.2  MCV 91.1 91.8 93  PLT 289 249 276   Lipid Panel:  Recent Labs  10/07/12 1745 05/23/13 0914 06/05/13 0854  CHOL 163  --   --   HDL 39* 37* 46  LDLCALC 105* 64 105*  TRIG 94 189* 110  CHOLHDL 4.2 3.8 3.8   TSH: No results found for this basename: TSH,  in the last 8760 hours A1C: Lab Results  Component Value Date   HGBA1C 12.5* 06/05/2013    Assessment/Plan 1. Type 2 diabetes, uncontrolled, with renal manifestation -pt reports diet and medication compliance however A1c is 12.5! -recently levemir was increased from 30 BID to 40 BID without changes in blood sugars -will increase levemir to 45 BID, start novolog with meals to give 5 units for cbg 150-300 and 8 units for blood sugar over 300  -conts on glipizide -pt to write down everything she eats and drinks and bring diary to next visit -to write down cbgs and novolog given -to follow up with Cathy next Monday   2. Microalbuminuria - lisinopril (PRINIVIL,ZESTRIL) 10 MG tablet; Take 1 tablet (10 mg total) by mouth daily.  Dispense: 90 tablet; Refill: 3  3. Essential hypertension, benign - lisinopril (PRINIVIL,ZESTRIL) 10 MG tablet; Take 1 tablet (10 mg total) by mouth daily.  Dispense: 90 tablet; Refill: 3 for better blood pressure control and due to mircalbuminuria -also remains on verapamil which is for rate control per pt 4. Overactive bladder -has stopped current medication due to insurance, will start vesicare at this time - solifenacin (VESICARE) 5 MG tablet; Start with 1  tablet daily for overactive bladder- if not effective may increase to 2 tablets daily  Dispense: 60 tablet; Refill: 2  5. Hyperlipidemia LDL elevated, conts on zocor -encouraged to cont diet modifications  6. Vit D def -conts on vit d 50000 units weekly  7. Insomnia -conts on ambien and trazodone  8. Migraines -on Topamax, no recent headaches  9. GERD -stable on zantax and omeprazole  10.  Anxiety and depression -follows with psych, symptoms controlled on   11. DOE -conts to follow up with pulmonary, currently on symicort and albuterol   12. PREVENTIVE COUNSELING:  The patient was counseled regarding the appropriate use of alcohol, regular self-examination of the breasts on a monthly basis, prevention of dental and periodontal  disease, diet, regular sustained exercise for at least 30 minutes 3-4 times per week, routine screening interval for mammogram as recommended by the [facility] and ACOG, importance of regular PAP smears, , the proper use of sunscreen and protective clothing, tobacco use,  and recommended schedule for GI hemoccult testing, colonoscopy, cholesterol, thyroid and diabetes screening. Pt needs PAP and will reschedule if she decides to have one.  Will order mammogram

## 2013-06-08 ENCOUNTER — Telehealth: Payer: Self-pay | Admitting: Cardiology

## 2013-06-08 NOTE — Telephone Encounter (Signed)
ROI faxed to [facility] @ [phone]  5.21.15/km

## 2013-06-09 ENCOUNTER — Other Ambulatory Visit: Payer: Self-pay | Admitting: Nurse Practitioner

## 2013-06-09 ENCOUNTER — Other Ambulatory Visit: Payer: Self-pay | Admitting: Internal Medicine

## 2013-06-16 ENCOUNTER — Telehealth: Payer: Self-pay | Admitting: Pulmonary Disease

## 2013-06-16 ENCOUNTER — Other Ambulatory Visit: Payer: Self-pay

## 2013-06-16 ENCOUNTER — Ambulatory Visit (HOSPITAL_COMMUNITY): Admission: RE | Admit: 2013-06-16 | Payer: Medicare Other | Source: Ambulatory Visit

## 2013-06-16 ENCOUNTER — Encounter (HOSPITAL_COMMUNITY): Payer: Self-pay | Admitting: Emergency Medicine

## 2013-06-16 ENCOUNTER — Emergency Department (HOSPITAL_COMMUNITY): Payer: Medicare Other

## 2013-06-16 ENCOUNTER — Emergency Department (HOSPITAL_COMMUNITY)
Admission: EM | Admit: 2013-06-16 | Discharge: 2013-06-16 | Disposition: A | Discharge status: Home / Self Care | Payer: Medicare Other | Attending: Emergency Medicine | Admitting: Emergency Medicine

## 2013-06-16 DIAGNOSIS — E1149 Type 2 diabetes mellitus with other diabetic neurological complication: Secondary | ICD-10-CM | POA: Insufficient documentation

## 2013-06-16 DIAGNOSIS — Z791 Long term (current) use of non-steroidal anti-inflammatories (NSAID): Secondary | ICD-10-CM | POA: Insufficient documentation

## 2013-06-16 DIAGNOSIS — J441 Chronic obstructive pulmonary disease with (acute) exacerbation: Secondary | ICD-10-CM | POA: Insufficient documentation

## 2013-06-16 DIAGNOSIS — E1142 Type 2 diabetes mellitus with diabetic polyneuropathy: Secondary | ICD-10-CM | POA: Insufficient documentation

## 2013-06-16 DIAGNOSIS — Z87442 Personal history of urinary calculi: Secondary | ICD-10-CM | POA: Insufficient documentation

## 2013-06-16 DIAGNOSIS — K219 Gastro-esophageal reflux disease without esophagitis: Secondary | ICD-10-CM | POA: Insufficient documentation

## 2013-06-16 DIAGNOSIS — G40309 Generalized idiopathic epilepsy and epileptic syndromes, not intractable, without status epilepticus: Secondary | ICD-10-CM | POA: Insufficient documentation

## 2013-06-16 DIAGNOSIS — I10 Essential (primary) hypertension: Secondary | ICD-10-CM | POA: Insufficient documentation

## 2013-06-16 DIAGNOSIS — R011 Cardiac murmur, unspecified: Secondary | ICD-10-CM | POA: Insufficient documentation

## 2013-06-16 DIAGNOSIS — M129 Arthropathy, unspecified: Secondary | ICD-10-CM | POA: Insufficient documentation

## 2013-06-16 DIAGNOSIS — IMO0002 Reserved for concepts with insufficient information to code with codable children: Secondary | ICD-10-CM | POA: Insufficient documentation

## 2013-06-16 DIAGNOSIS — G43909 Migraine, unspecified, not intractable, without status migrainosus: Secondary | ICD-10-CM | POA: Insufficient documentation

## 2013-06-16 DIAGNOSIS — Z794 Long term (current) use of insulin: Secondary | ICD-10-CM | POA: Insufficient documentation

## 2013-06-16 DIAGNOSIS — Z87891 Personal history of nicotine dependence: Secondary | ICD-10-CM | POA: Insufficient documentation

## 2013-06-16 DIAGNOSIS — Z8673 Personal history of transient ischemic attack (TIA), and cerebral infarction without residual deficits: Secondary | ICD-10-CM | POA: Insufficient documentation

## 2013-06-16 DIAGNOSIS — H548 Legal blindness, as defined in USA: Secondary | ICD-10-CM | POA: Insufficient documentation

## 2013-06-16 DIAGNOSIS — I252 Old myocardial infarction: Secondary | ICD-10-CM | POA: Insufficient documentation

## 2013-06-16 DIAGNOSIS — Z7902 Long term (current) use of antithrombotics/antiplatelets: Secondary | ICD-10-CM | POA: Insufficient documentation

## 2013-06-16 DIAGNOSIS — J45901 Unspecified asthma with (acute) exacerbation: Principal | ICD-10-CM

## 2013-06-16 DIAGNOSIS — R0600 Dyspnea, unspecified: Secondary | ICD-10-CM

## 2013-06-16 DIAGNOSIS — M549 Dorsalgia, unspecified: Secondary | ICD-10-CM | POA: Insufficient documentation

## 2013-06-16 DIAGNOSIS — Z8701 Personal history of pneumonia (recurrent): Secondary | ICD-10-CM | POA: Insufficient documentation

## 2013-06-16 DIAGNOSIS — R109 Unspecified abdominal pain: Secondary | ICD-10-CM | POA: Insufficient documentation

## 2013-06-16 DIAGNOSIS — Z79899 Other long term (current) drug therapy: Secondary | ICD-10-CM | POA: Insufficient documentation

## 2013-06-16 LAB — CBC WITH DIFFERENTIAL/PLATELET
Basophils Absolute: 0 10*3/uL (ref 0.0–0.1)
Basophils Relative: 0 % (ref 0–1)
Eosinophils Absolute: 0 10*3/uL (ref 0.0–0.7)
Eosinophils Relative: 0 % (ref 0–5)
HCT: 38.5 % (ref 36.0–46.0)
Hemoglobin: 12.3 g/dL (ref 12.0–15.0)
Lymphocytes Relative: 27 % (ref 12–46)
Lymphs Abs: 2 10*3/uL (ref 0.7–4.0)
MCH: 29.6 pg (ref 26.0–34.0)
MCHC: 31.9 g/dL (ref 30.0–36.0)
MCV: 92.5 fL (ref 78.0–100.0)
Monocytes Absolute: 0.6 10*3/uL (ref 0.1–1.0)
Monocytes Relative: 7 % (ref 3–12)
Neutro Abs: 4.9 10*3/uL (ref 1.7–7.7)
Neutrophils Relative %: 66 % (ref 43–77)
Platelets: 249 10*3/uL (ref 150–400)
RBC: 4.16 MIL/uL (ref 3.87–5.11)
RDW: 13.2 % (ref 11.5–15.5)
WBC: 7.4 10*3/uL (ref 4.0–10.5)

## 2013-06-16 LAB — URINALYSIS, ROUTINE W REFLEX MICROSCOPIC
Bilirubin Urine: NEGATIVE
Glucose, UA: 100 mg/dL — AB
Hgb urine dipstick: NEGATIVE
Ketones, ur: NEGATIVE mg/dL
Nitrite: NEGATIVE
Protein, ur: NEGATIVE mg/dL
Specific Gravity, Urine: 1.023 (ref 1.005–1.030)
Urobilinogen, UA: 1 mg/dL (ref 0.0–1.0)
pH: 7 (ref 5.0–8.0)

## 2013-06-16 LAB — URINE MICROSCOPIC-ADD ON

## 2013-06-16 LAB — COMPREHENSIVE METABOLIC PANEL
ALT: 11 U/L (ref 0–35)
AST: 11 U/L (ref 0–37)
Albumin: 3.2 g/dL — ABNORMAL LOW (ref 3.5–5.2)
Alkaline Phosphatase: 89 U/L (ref 39–117)
BUN: 16 mg/dL (ref 6–23)
CO2: 20 mEq/L (ref 19–32)
Calcium: 9 mg/dL (ref 8.4–10.5)
Chloride: 105 mEq/L (ref 96–112)
Creatinine, Ser: 0.77 mg/dL (ref 0.50–1.10)
GFR calc Af Amer: >90 mL/min (ref >90)
GFR calc non Af Amer: >90 mL/min (ref >90)
Glucose, Bld: 176 mg/dL — ABNORMAL HIGH (ref 70–99)
Potassium: 4.3 mEq/L (ref 3.7–5.3)
Sodium: 137 mEq/L (ref 137–147)
Total Bilirubin: <0.2 mg/dL — ABNORMAL LOW (ref 0.3–1.2)
Total Protein: 7 g/dL (ref 6.0–8.3)

## 2013-06-16 LAB — PRO B NATRIURETIC PEPTIDE: Pro B Natriuretic peptide (BNP): 7.7 pg/mL (ref 0–125)

## 2013-06-16 LAB — CBG MONITORING, ED: Glucose-Capillary: 185 mg/dL — ABNORMAL HIGH (ref 70–99)

## 2013-06-16 MED ORDER — KETOROLAC TROMETHAMINE 30 MG/ML IJ SOLN
30.0000 mg | Freq: Once | INTRAMUSCULAR | Status: AC
  Administered 2013-06-16: 30 mg via INTRAVENOUS
  Filled 2013-06-16: qty 1

## 2013-06-16 MED ORDER — ALBUTEROL SULFATE (2.5 MG/3ML) 0.083% IN NEBU: 2.5000 mg | INHALATION_SOLUTION | Freq: Once | RESPIRATORY_TRACT | Status: DC

## 2013-06-16 MED ORDER — PREDNISONE 20 MG PO TABS: 40.0000 mg | ORAL_TABLET | Freq: Every day | ORAL | Status: AC

## 2013-06-16 NOTE — ED Notes (Signed)
Ice given to pt per Lilia Pro.

## 2013-06-16 NOTE — ED Provider Notes (Signed)
1600 - Care from Dr. Vanita Panda. Sent here for SOB during PFTs. Awaiting labs. Also having back pain, Dr. Vanita Panda stated likely musculoskeletal. Patient's urine appears contaminated. She denies any dysuria. Stable for discharge.  1. Dyspnea   2. Back pain      Karen Shipper, MD 06/16/13 [phone]

## 2013-06-16 NOTE — Discharge Instructions (Signed)
As discussed, it is very important that you followup with your primary care physician and your pulmonologist.  For the next 3 days please use your inhaler every 4 hours in addition to the newly prescribed medication.  Return here for concerning changes in your condition.

## 2013-06-16 NOTE — Telephone Encounter (Signed)
Will route message to [facility] to make him aware.

## 2013-06-16 NOTE — ED Notes (Signed)
Pt transferred to xray at present time.

## 2013-06-16 NOTE — ED Notes (Addendum)
Pt was a hospital for outpatient breathing study when she had a sudden onset of sob and R flank pain at 1330. Pt speaking in complete sentences, 99% on RA, RR 20. Hx of kidney stones and copd

## 2013-06-16 NOTE — ED Notes (Signed)
Pt reports intermittent non productive cough for a month with new onset of SOB last night. Pt lungs clear on assessment. Pt NAD.

## 2013-06-16 NOTE — ED Notes (Signed)
Pt. Is unable to use the restroom at this time, but is aware that we need a urine specimen.  

## 2013-06-16 NOTE — ED Provider Notes (Signed)
CSN: [id number]     Arrival date & time 06/16/13  1355 History   First MD Initiated Contact with Patient 06/16/13 1409     Chief Complaint  Patient presents with  . Shortness of Breath  . Flank Pain      HPI  Patient presents from pulmonology clinic with concerns of dyspnea. Patient has a long history of respiratory disease, though no recent steroid use. She notes that she was attending a previously scheduled pulmonology visit when her therapist that she was particularly dyspneic, and patient did not complete the testing session. Patient was bilateral mid back pain, which is sore, nonradiating. No concurrent cough, fever, chills, diarrhea, vomiting, dysuria, hematuria, polyuria. Patient states that she takes her medication as directed.   Past Medical History  Diagnosis Date  . Hypertension   . Asthma   . Seizures   . Heart murmur     born with   . Dysrhythmia     skipped beats  . Myocardial infarction     hi of x 2  last one 2003   . COPD (chronic obstructive pulmonary disease)     chronic bronchitis   . Pneumonia     hx of 2009  . GERD (gastroesophageal reflux disease)   . Headache(784.0)     migraines   . Neuropathy     due to diabetes   . Dizziness     secondary to diabetes and hypertension   . Arthritis     joint pain   . Hx of blood clots     hematomas removed from left side of brain from 30mo to 89yrold   . Epilepsy idiopathic petit mal     last seizure 2012;controlled w/ topomax  . Diabetic neuropathy, painful     FEET AND HANDS  . Diabetes mellitus     since age [age]type 2 IDDM  . Sleep apnea     [facility];does not use Cpap ; mild  . MIGRAINE HEADACHE 09/14/2006    Qualifier: Diagnosis of  By: MaHassell DoneNP, NyTori Milks  . Glaucoma     NOT ON ANY EYE DROPS   . Legally blind     left eye   . Stroke     last 2003  RESIDUAL LEFT LEG WEAKNESS--NO OTHER RESIDUAL PROBLEMS  . CVA 11/14/2007    Qualifier: Diagnosis of  By: MaHassell DoneNP,  NyTori Milks  . Pain 09/23/11    LOWER BACK AND RIGHT SIDE--PT HAS RIGHT URETERAL STONE  . Nephrolithiasis     frequent urination , urination at nite  PT SEEN IN ER 09/22/11 FOR BACK AND RT SIDED PAIN--HAS KNOWN STONE RT URETER AND UA IN ER SHOWED UTI   Past Surgical History  Procedure Laterality Date  . Kidney stone removal    . Brain hematoma evacutation      five procedures total, first procedure when [age]onths old, last at [age]ears of age.  . Shunt removal      shunt inserted at age [age]emoved at age 48 . Eye surgery    . Retinal detachment surgery  1990  . Vaginal hysterectomy  1996  . Incisional hernia repair  05/05/2011    Procedure: LAPAROSCOPIC INCISIONAL HERNIA;  Surgeon: MaRolm BookbinderMD;  [facility] Service: General;  Laterality: N/A;  . Mass excision  05/05/2011    Procedure: EXCISION MASS;  Surgeon: MaRolm BookbinderMD;  [facility] Service:  General;  Laterality: Right;  . Hernia repair  05/05/11    lap incisional hernia  . Pcnl    . Cystoscopy/retrograde/ureteroscopy  08/24/2011    Procedure: CYSTOSCOPY/RETROGRADE/URETEROSCOPY;  Surgeon: Molli Hazard, MD;  Location: [facility];  Service: Urology;  Laterality: Right;  Cysto, Right retrograde Pyelogram, right stent placement.   . Cystoscopy with ureteroscopy  09/24/2011    Procedure: CYSTOSCOPY WITH URETEROSCOPY;  Surgeon: Malka So, MD;  Location: [facility];  Service: Urology;  Laterality: Right;  Balloon dilation right ureter   . Cystoscopy w/ retrogrades  09/24/2011    Procedure: CYSTOSCOPY WITH RETROGRADE PYELOGRAM;  Surgeon: Malka So, MD;  Location: [facility];  Service: Urology;  Laterality: Right;   Family History  Problem Relation Age of Onset  . Cancer Mother     uterine  . Hypertension Mother   . Cancer Maternal Grandmother     brain  . Hypertension Maternal Grandmother   . Anesthesia problems Neg Hx    History  Substance Use Topics  . Smoking status: Former Smoker -- 1.00 packs/day for 30  years    Types: Cigarettes    Quit date: 01/20/1987  . Smokeless tobacco: Never Used     Comment: quit more than 20years  . Alcohol Use: No   OB History   Grav Para Term Preterm Abortions TAB SAB Ect Mult Living   3 2 2  1  1   2      Review of Systems  Constitutional:       Per HPI, otherwise negative  HENT:       Per HPI, otherwise negative  Respiratory:       Per HPI, otherwise negative  Cardiovascular:       Per HPI, otherwise negative  Gastrointestinal: Negative for vomiting.  Endocrine:       Negative aside from HPI  Genitourinary:       Neg aside from HPI   Musculoskeletal:       Per HPI, otherwise negative  Skin: Negative.   Neurological: Negative for syncope.      Allergies  Codeine and Lactose intolerance (gi)  Home Medications   Prior to Admission medications   Medication Sig Start Date End Date Taking? Authorizing Provider  albuterol (PROVENTIL HFA;VENTOLIN HFA) 108 (90 BASE) MCG/ACT inhaler Inhale 2 puffs into the lungs every 6 (six) hours as needed for wheezing or shortness of breath. Wheezing 04/13/13  Yes Pricilla Larsson, NP  baclofen (LIORESAL) 10 MG tablet Take 10 mg by mouth 3 (three) times daily.   Yes Historical Provider, MD  budesonide-formoterol (SYMBICORT) 160-4.5 MCG/ACT inhaler Inhale 2 puffs into the lungs 2 (two) times daily. 04/13/13  Yes Pricilla Larsson, NP  cetirizine (ZYRTEC) 10 MG tablet Take 10 mg by mouth at bedtime. For allergies   Yes Historical Provider, MD  clopidogrel (PLAVIX) 75 MG tablet Take 1 tablet (75 mg total) by mouth daily. 04/13/13  Yes Pricilla Larsson, NP  diclofenac (VOLTAREN) 75 MG EC tablet Take 75 mg by mouth 2 (two) times daily.   Yes Historical Provider, MD  FLUoxetine (PROZAC) 20 MG capsule Take 3 capsules (60 mg total) by mouth daily at 12 noon. 04/13/13  Yes Pricilla Larsson, NP  fluticasone (FLONASE) 50 MCG/ACT nasal spray Place 2 sprays into both nostrils daily.   Yes Historical Provider, MD  gabapentin  (NEURONTIN) 300 MG capsule Take 600 mg by mouth 3 (three) times daily.   Yes Historical Provider, MD  glipiZIDE (GLUCOTROL XL) 10 MG 24 hr tablet Take 20 mg by mouth daily with breakfast.   Yes Historical Provider, MD  insulin aspart (NOVOLOG) 100 UNIT/ML injection Inject 7-8 Units into the skin 3 (three) times daily before meals. 7 units with each meal and if bs is over >300 do 8 units   Yes Historical Provider, MD  insulin detemir (LEVEMIR) 100 UNIT/ML injection Inject 45 Units into the skin 2 (two) times daily.    Yes Historical Provider, MD  lisinopril (PRINIVIL,ZESTRIL) 10 MG tablet Take 1 tablet (10 mg total) by mouth daily. 06/07/13  Yes Pricilla Larsson, NP  omeprazole (PRILOSEC) 20 MG capsule Take 1 capsule (20 mg total) by mouth daily. For Hernia and acid reflux 04/13/13  Yes Pricilla Larsson, NP  promethazine (PHENERGAN) 25 MG tablet Take 1 tablet (25 mg total) by mouth every 6 (six) hours as needed for nausea. 10/12/11  Yes Carmin Muskrat, MD  ranitidine (ZANTAC) 150 MG tablet Take 1 tablet (150 mg total) by mouth 2 (two) times daily. For Gastritis 04/13/13  Yes Pricilla Larsson, NP  simvastatin (ZOCOR) 20 MG tablet Take 1 tablet (20 mg total) by mouth daily. TAKES IN AM, for cholesterol 04/13/13  Yes Pricilla Larsson, NP  solifenacin (VESICARE) 5 MG tablet Take 5 mg by mouth 2 (two) times daily.   Yes Historical Provider, MD  topiramate (TOPAMAX) 200 MG tablet Take 200 mg by mouth 2 (two) times daily.   Yes Historical Provider, MD  traZODone (DESYREL) 150 MG tablet Take 1 tablet (150 mg total) by mouth at bedtime. For Sleep 04/13/13  Yes Pricilla Larsson, NP  verapamil (COVERA HS) 240 MG (CO) 24 hr tablet Take 240 mg by mouth at bedtime.   Yes Historical Provider, MD  Vitamin D, Ergocalciferol, (DRISDOL) 50000 UNITS CAPS capsule Take 50,000 Units by mouth every Thursday.   Yes Historical Provider, MD  zolpidem (AMBIEN) 5 MG tablet Take 1 tablet (5 mg total) by mouth at bedtime as needed for sleep.  04/05/13  Yes Norma Fredrickson, MD  EASY TOUCH PEN NEEDLES 31G X 8 MM MISC USE AS DIRECTED TO INJECT INSULIN DAILY    Pricilla Larsson, NP  ONETOUCH DELICA LANCETS 16X MISC TEST BLOOD SUGAR FOUR TIMES A DAY    Pricilla Larsson, NP   BP 114/82  Pulse 80  Temp(Src) 98.3 F (36.8 C) (Oral)  Resp 20  SpO2 98% Physical Exam  Nursing note and vitals reviewed. Constitutional: She is oriented to person, place, and time. She appears well-developed and well-nourished. No distress.  HENT:  Head: Normocephalic and atraumatic.  Eyes: Conjunctivae and EOM are normal.  Cardiovascular: Normal rate and regular rhythm.   Pulmonary/Chest: Effort normal and breath sounds normal. No stridor. No respiratory distress.  Abdominal: She exhibits no distension.  Musculoskeletal: She exhibits no edema.  Neurological: She is alert and oriented to person, place, and time. No cranial nerve deficit.  Skin: Skin is warm and dry.  Psychiatric: She has a normal mood and affect.    ED Course  Procedures (including critical care time) Labs Review Labs Reviewed  CBG MONITORING, ED - Abnormal; Notable for the following:    Glucose-Capillary 185 (*)    All other components within normal limits  CBC WITH DIFFERENTIAL  COMPREHENSIVE METABOLIC PANEL  PRO B NATRIURETIC PEPTIDE  URINALYSIS, ROUTINE W REFLEX MICROSCOPIC    Imaging Review Dg Chest 2 View  06/16/2013   CLINICAL DATA:  Chest pain. Shortness  of breath. Asthma. Hypertension and diabetes.  EXAM: CHEST  2 VIEW  COMPARISON:  06/06/13  FINDINGS: The heart size and mediastinal contours are within normal limits. Both lungs are clear. The visualized skeletal structures are unremarkable.  IMPRESSION: No active cardiopulmonary disease.   Electronically Signed   By: Earle Gell M.D.   On: 06/16/2013 15:03     EKG Interpretation   Date/Time:  Friday Jun 16 2013 14:01:23 EDT Ventricular Rate:  74 PR Interval:  185 QRS Duration: 74 QT Interval:  398 QTC Calculation:  442 R Axis:   84 Text Interpretation:  Sinus rhythm Left atrial enlargement ST elev,  probable normal early repol pattern Baseline wander in lead(s) V5 Sinus  rhythm Early repolarization (masked by fascicular block?) No significant  change since last tracing Abnormal ekg Confirmed by Carmin Muskrat  MD  [phone]) on 06/16/2013 3:58:32 PM     I reviewed the patient's electronic medical record.  Pulse oximetry 99% room air normal Cardiac monitor 75 sinus rhythm normal   EKG has sinus rhythm, rate 74, repol pattern, otherwise unremarkable  MDM   Final diagnoses:  Dyspnea  Back pain   Patient presents from pulmonology clinic with concerns of dyspnea.  Patient has a hx of resp disease and VS are not c/w pe, nor is this presentation c/w atypical ACS. Patient's back pain does not seem likely due to kidney stone, and with improvement in her condition her, she started on a new course of medication, discharged to follow up with her pulmonologist and primary care physician.      Carmin Muskrat, MD 06/17/13 [phone]

## 2013-06-16 NOTE — ED Notes (Signed)
Patient reports pain is similar to kidney stone pain about 4-5 years ago.  Patient had such large stones that they had to be removed surgically.  Stents placed in right kide;y last year at [facility].  Patients reports many bladder and kidney infections in the last year.  Patient also reports several episodes of SOB which lasted  Approximately 10 minutes yesterday and today.  Patient denies chest pain, but did get dizzy at the time.

## 2013-06-19 ENCOUNTER — Ambulatory Visit (INDEPENDENT_AMBULATORY_CARE_PROVIDER_SITE_OTHER): Payer: PRIVATE HEALTH INSURANCE | Admitting: Pharmacotherapy

## 2013-06-19 ENCOUNTER — Encounter: Payer: Self-pay | Admitting: Pharmacotherapy

## 2013-06-19 VITALS — BP 132/72 | HR 78 | Wt 241.0 lb

## 2013-06-19 DIAGNOSIS — I10 Essential (primary) hypertension: Secondary | ICD-10-CM

## 2013-06-19 DIAGNOSIS — IMO0002 Reserved for concepts with insufficient information to code with codable children: Secondary | ICD-10-CM

## 2013-06-19 DIAGNOSIS — E1165 Type 2 diabetes mellitus with hyperglycemia: Secondary | ICD-10-CM

## 2013-06-19 DIAGNOSIS — E1129 Type 2 diabetes mellitus with other diabetic kidney complication: Secondary | ICD-10-CM

## 2013-06-19 LAB — PULMONARY FUNCTION TEST
FEF 25-75 Pre: 1.43 L/sec
FEF2575-%Pred-Pre: 54 %
FEV1-%Pred-Pre: 53 %
FEV1-Pre: 1.35 L
FEV1FVC-%Pred-Pre: 100 %
FEV6-%Pred-Pre: 53 %
FEV6-Pre: 1.63 L
FEV6FVC-%Pred-Pre: 103 %
FVC-%Pred-Pre: 51 %
FVC-Pre: 1.63 L
Pre FEV1/FVC ratio: 83 %
Pre FEV6/FVC Ratio: 100 %

## 2013-06-19 NOTE — Progress Notes (Signed)
Subjective:    [name] is a 48 y.o.African American female who presents for follow-up of Type 2 diabetes mellitus.   She was recently started on basal / bolus therapy. She went to [facility] ER with SOB and flank pain.  She was given an RX for Prednisone 51m daily x 5 days.  She did not bring her blood glucose meter.  But did bring a logbook She is currently taking Levemir 45 units twice daily.  Novolog - 7 units with each meal, if BG>300 increase to 8 units with each meal. Feels dizzy, nauseated, fatigued.  Knows it is because of high blood sugar. No hypoglycemia. BG:  177-349 (average around 300).  Nocturia every night. 4-5 times per night. Exercise limited due to left leg pain.  She does try to walk around backyard.  She does get DOE. She tries to stick to [place] chicken and fish.  Tries to stick to whole wheat.  She is lactose intolerant. Has neuropathy on left side. She is legally blind in left eye.  She is overdue for eye exam.    Review of Systems A comprehensive review of systems was negative except for: Eyes: positive for contacts/glasses and legally blind in left eye Cardiovascular: positive for lower extremity edema Genitourinary: positive for nocturia Neurological: positive for weakness    Objective:    BP 132/72  Pulse 78  Wt 241 lb (109.317 kg)  SpO2 97%  General:  alert, cooperative, appears older than stated age and no distress  Oropharynx: normal findings: lips normal without lesions and gums healthy   Eyes:  negative findings: lids and lashes normal and conjunctivae and sclerae normal   Ears:  external ears normal        Lung: clear to auscultation bilaterally  Heart:  regular rate and rhythm     Extremities: edema bilateral lower extremities  Skin: dry     Neuro: mental status, speech normal, alert and oriented x3   Lab Review Glucose (mg/dL)  Date Value  06/05/2013 291*  05/23/2013 349*     Glucose, Bld (mg/dL)  Date Value  06/16/2013 176*   02/06/2013 197*  02/01/2013 102*     CO2 (mEq/L)  Date Value  06/16/2013 20   06/05/2013 18   05/23/2013 18      BUN (mg/dL)  Date Value  06/16/2013 16   06/05/2013 15   05/23/2013 15   02/06/2013 15   02/01/2013 14      Creat (mg/dL)  Date Value  10/07/2012 0.89      Creatinine, Ser (mg/dL)  Date Value  06/16/2013 0.77   06/05/2013 0.84   05/23/2013 0.87     06/05/13: A1C:  12.5% Total cholesterol:  173 Triglycerides:  110 HDL:  46 LDL:  105 LFT:  wnl   Assessment:    Diabetes Mellitus type II, under poor control. A1C is 12.5%, but daily BG checks are starting to improve. BP at goal <140/90    Plan:    1.  Rx changes: 1.  Increase Levemir to 50 units twice daily.  2.  Increase Novolog to 10 units with each meal, 12 units if BG>300 2.  Counseled on basal / bolus therapy and need for 2 different types of insulin. 3.  Not a great candidate for SGLT-2 therapy. 4.  Counseled on meal planning and serving size. 5.  Counseled on benefit of routine exercise.  Goal is 30-45 minutes 5 x week. 6.  Will refer to ophthalmology for eye  exam. 7.  BP at goal <140/90.  Continue lisinopril, verapamil 8.  LDL close to target <100.  Continue simvastatin. 9.  RTC in 1 month to follow up BG.

## 2013-06-19 NOTE — Patient Instructions (Signed)
Increase Levemir to 50 units twice daily. Increase Novolog to 10 units with each meal (12 units if sugar >300)

## 2013-06-22 ENCOUNTER — Other Ambulatory Visit (HOSPITAL_COMMUNITY): Payer: Self-pay

## 2013-06-23 ENCOUNTER — Ambulatory Visit: Payer: Self-pay

## 2013-07-03 ENCOUNTER — Other Ambulatory Visit: Payer: Self-pay | Admitting: Internal Medicine

## 2013-07-07 ENCOUNTER — Other Ambulatory Visit: Payer: Self-pay | Admitting: Nurse Practitioner

## 2013-07-07 ENCOUNTER — Ambulatory Visit (HOSPITAL_COMMUNITY): Payer: Self-pay | Admitting: Psychiatry

## 2013-07-12 ENCOUNTER — Other Ambulatory Visit (HOSPITAL_COMMUNITY): Payer: Self-pay

## 2013-07-12 ENCOUNTER — Ambulatory Visit (HOSPITAL_COMMUNITY): Payer: Self-pay | Admitting: Psychiatry

## 2013-07-13 ENCOUNTER — Ambulatory Visit (HOSPITAL_COMMUNITY): Payer: Self-pay | Admitting: Psychiatry

## 2013-07-13 ENCOUNTER — Encounter: Payer: Self-pay | Admitting: Nurse Practitioner

## 2013-07-19 ENCOUNTER — Other Ambulatory Visit: Payer: Self-pay | Admitting: Pulmonary Disease

## 2013-07-19 DIAGNOSIS — R0602 Shortness of breath: Secondary | ICD-10-CM

## 2013-07-24 ENCOUNTER — Ambulatory Visit: Payer: PRIVATE HEALTH INSURANCE | Admitting: Pharmacotherapy

## 2013-07-27 ENCOUNTER — Encounter: Payer: Self-pay | Admitting: Internal Medicine

## 2013-07-28 ENCOUNTER — Telehealth: Payer: Self-pay | Admitting: Cardiology

## 2013-07-28 NOTE — Telephone Encounter (Signed)
Records rec Via Mail From [place], [place] C/Dr.Turner  7.10.15/km

## 2013-07-31 ENCOUNTER — Ambulatory Visit: Payer: PRIVATE HEALTH INSURANCE | Admitting: Pharmacotherapy

## 2013-08-07 ENCOUNTER — Telehealth: Payer: Self-pay | Admitting: Cardiology

## 2013-08-07 NOTE — Telephone Encounter (Signed)
Please find out where patient had her CVA and get old records.  Records that I received say nothing about a CVA

## 2013-08-07 NOTE — Telephone Encounter (Signed)
Also apparently had MI so we need records from where ever that hospitalization was

## 2013-08-08 NOTE — Telephone Encounter (Signed)
To kim to get medical records.

## 2013-08-08 NOTE — Telephone Encounter (Signed)
Pt states [facility] is the only hospital she went to for both problems.

## 2013-08-08 NOTE — Telephone Encounter (Signed)
Also apparently had MI so we need records from where ever that hospitalization was (per Dr Radford Pax)

## 2013-08-08 NOTE — Telephone Encounter (Signed)
Records Release Faxed to [facility] to [place] CVA & MI Information.

## 2013-08-08 NOTE — Telephone Encounter (Signed)
Please call to get all hospital records

## 2013-08-12 ENCOUNTER — Other Ambulatory Visit: Payer: Self-pay | Admitting: Nurse Practitioner

## 2013-09-05 ENCOUNTER — Telehealth: Payer: Self-pay | Admitting: Cardiology

## 2013-09-05 NOTE — Telephone Encounter (Signed)
Records rec Via Mail From [facility], gave to [place]   8.18.15/km

## 2013-09-06 ENCOUNTER — Other Ambulatory Visit: Payer: Self-pay | Admitting: *Deleted

## 2013-09-06 ENCOUNTER — Ambulatory Visit: Payer: PRIVATE HEALTH INSURANCE | Admitting: Nurse Practitioner

## 2013-09-06 ENCOUNTER — Ambulatory Visit (INDEPENDENT_AMBULATORY_CARE_PROVIDER_SITE_OTHER): Payer: Medicare Other | Admitting: Psychiatry

## 2013-09-06 VITALS — BP 137/89 | HR 95 | Ht 66.0 in | Wt 239.4 lb

## 2013-09-06 DIAGNOSIS — G4700 Insomnia, unspecified: Secondary | ICD-10-CM

## 2013-09-06 DIAGNOSIS — F329 Major depressive disorder, single episode, unspecified: Secondary | ICD-10-CM

## 2013-09-06 DIAGNOSIS — F332 Major depressive disorder, recurrent severe without psychotic features: Secondary | ICD-10-CM

## 2013-09-06 DIAGNOSIS — F32A Depression, unspecified: Secondary | ICD-10-CM

## 2013-09-06 DIAGNOSIS — F419 Anxiety disorder, unspecified: Principal | ICD-10-CM

## 2013-09-06 MED ORDER — ZOLPIDEM TARTRATE 5 MG PO TABS: 5.0000 mg | ORAL_TABLET | Freq: Every evening | ORAL | Status: DC | PRN

## 2013-09-06 MED ORDER — SOLIFENACIN SUCCINATE 5 MG PO TABS: ORAL_TABLET | ORAL | Status: DC

## 2013-09-06 MED ORDER — FLUOXETINE HCL 20 MG PO CAPS: 60.0000 mg | ORAL_CAPSULE | Freq: Every day | ORAL | Status: DC

## 2013-09-06 MED ORDER — TRAZODONE HCL 150 MG PO TABS: 150.0000 mg | ORAL_TABLET | Freq: Every day | ORAL | Status: DC

## 2013-09-06 NOTE — Progress Notes (Signed)
[facility] MD Progress Note  09/06/2013 4:17 PM [name]  MRN:  [id number] Subjective sad Today the patient starts to session by saying her husband at age 49 died one month ago from a massive heart attack. The patient is obviously sad but seems to be hanging in there. She's moved in with her daughter. The patient says her sleep is up and down and that her weight is somewhat reduced. She's not suicidal. The patient is doing fairly well. She does not drink any alcohol. She still able to enjoy some of the things around her. She is obviously still grieving the death of first husband. It was very sudden and unexpected. The patient is taking 60 mg of Prozac and continues to take trazodone and Ambien. She's not had any seizures. She has multiple medical illnesses but they seem to be stable at this time. Diagnosis:   DSM5: Schizophrenia Disorders:   Obsessive-Compulsive Disorders:   Trauma-Stressor Disorders:   Substance/Addictive Disorders:   Depressive Disorders:  Major Depressive Disorder - Mild (296.21) Total Time spent with patient: 30 minutes  Axis I: Major Depression, Recurrent severe  ADL's:  Intact  Sleep: Good  Appetite:  Good  Suicidal Ideation:  no Homicidal Ideation:  none AEB (as evidenced by):  Psychiatric Specialty Exam: Physical Exam  ROS  Blood pressure 137/89, pulse 95, height 5' 6"  (1.676 m), weight 239 lb 6.4 oz (108.591 kg).Body mass index is 38.66 kg/(m^2).  General Appearance: Fairly Groomed  Engineer, water::  Good  Speech:  Clear and Coherent  Volume:  Normal  Mood:  Dysphoric  Affect:  Flat  Thought Process:  Coherent  Orientation:  Full (Time, Place, and Person)  Thought Content:  WDL  Suicidal Thoughts:  No  Homicidal Thoughts:  No  Memory:  NA  Judgement:  NA  Insight:  Good  Psychomotor Activity:  Normal  Concentration:  Good  Recall:  Good  Fund of Knowledge:Good  Language: Good  Akathisia:  No  Handed:  Right  AIMS (if indicated):      Assets:  Desire for Improvement  Sleep:      Musculoskeletal: Strength & Muscle Tone:  Gait & Station:  Patient leans:   Current Medications: Current Outpatient Prescriptions  Medication Sig Dispense Refill  . albuterol (PROVENTIL HFA;VENTOLIN HFA) 108 (90 BASE) MCG/ACT inhaler Inhale 2 puffs into the lungs every 6 (six) hours as needed for wheezing or shortness of breath. Wheezing  6.7 g  3  . baclofen (LIORESAL) 10 MG tablet Take 10 mg by mouth 3 (three) times daily.      . baclofen (LIORESAL) 10 MG tablet Take 1 tablet by mouth three times daily as needed for muscle spasms.  90 tablet  0  . budesonide-formoterol (SYMBICORT) 160-4.5 MCG/ACT inhaler Inhale 2 puffs into the lungs 2 (two) times daily.  1 Inhaler  3  . cetirizine (ZYRTEC) 10 MG tablet Take 10 mg by mouth at bedtime. For allergies      . clopidogrel (PLAVIX) 75 MG tablet Take 1 tablet (75 mg total) by mouth daily.  30 tablet  3  . diclofenac (VOLTAREN) 75 MG EC tablet Take 75 mg by mouth 2 (two) times daily.      [name] EASY TOUCH PEN NEEDLES 31G X 8 MM MISC USE AS DIRECTED TO INJECT INSULIN DAILY  100 each  5  . FLUoxetine (PROZAC) 20 MG capsule Take 3 capsules (60 mg total) by mouth daily at 12 noon.  90 capsule  3  .  fluticasone (FLONASE) 50 MCG/ACT nasal spray Place 2 sprays into both nostrils daily.      [name] gabapentin (NEURONTIN) 300 MG capsule Take 600 mg by mouth 3 (three) times daily.      [name] gabapentin (NEURONTIN) 300 MG capsule TAKE 2 CAPSULES BY MOUTH THREE TIMES A DAY  180 capsule  2  . glipiZIDE (GLUCOTROL XL) 10 MG 24 hr tablet Take 20 mg by mouth daily with breakfast.      . GLIPIZIDE XL 10 MG 24 hr tablet TAKE 2 TABLETS BY MOUTH EVERY DAY WITH BREAKFAST  60 tablet  3  . insulin aspart (NOVOLOG) 100 UNIT/ML injection Inject 7-8 Units into the skin 3 (three) times daily before meals. 7 units with each meal and if bs is over >300 do 8 units      . insulin detemir (LEVEMIR) 100 UNIT/ML injection Inject 45 Units into the  skin 2 (two) times daily.       [name] lisinopril (PRINIVIL,ZESTRIL) 10 MG tablet Take 1 tablet (10 mg total) by mouth daily.  90 tablet  3  . omeprazole (PRILOSEC) 20 MG capsule Take 1 capsule (20 mg total) by mouth daily. For Hernia and acid reflux  30 capsule  3  . ONETOUCH DELICA LANCETS 54U MISC TEST BLOOD SUGAR FOUR TIMES A DAY  200 each  5  . promethazine (PHENERGAN) 25 MG tablet Take 1 tablet (25 mg total) by mouth every 6 (six) hours as needed for nausea.  20 tablet  0  . ranitidine (ZANTAC) 150 MG tablet Take 1 tablet (150 mg total) by mouth 2 (two) times daily. For Gastritis  60 tablet  3  . simvastatin (ZOCOR) 20 MG tablet Take 1 tablet (20 mg total) by mouth daily. TAKES IN AM, for cholesterol  30 tablet  3  . solifenacin (VESICARE) 5 MG tablet Take 5 mg by mouth 2 (two) times daily.      . solifenacin (VESICARE) 5 MG tablet Take one tablet by mouth once daily for overactive bladder--If not effective may increase to Two tablets daily  60 tablet  3  . topiramate (TOPAMAX) 200 MG tablet Take 200 mg by mouth 2 (two) times daily.      [name] topiramate (TOPAMAX) 200 MG tablet TAKE 1 TABLET BY MOUTH TWICE DAILY  60 tablet  2  . traZODone (DESYREL) 150 MG tablet Take 1 tablet (150 mg total) by mouth at bedtime. For Sleep  30 tablet  5  . verapamil (COVERA HS) 240 MG (CO) 24 hr tablet Take 240 mg by mouth at bedtime.      . verapamil (VERELAN PM) 240 MG 24 hr capsule TAKE 1 CAPSULE BY MOUTH EVERY NIGHT AT BEDTIME FOR BLOOD PRESSURE  30 capsule  2  . Vitamin D, Ergocalciferol, (DRISDOL) 50000 UNITS CAPS capsule Take 50,000 Units by mouth every Thursday.      . zolpidem (AMBIEN) 5 MG tablet Take 1 tablet (5 mg total) by mouth at bedtime as needed for sleep.  30 tablet  5   No current facility-administered medications for this visit.    Lab Results: No results found for this or any previous visit (from the past 48 hour(s)).  Physical Findings: AIMS:  , ,  ,  ,    CIWA:    COWS:     Treatment Plan  Summary: At this time the patient will continue taking all her medications. Today we talked about the possibility of considering hospice in the future but I'm  not sure she is ready for that. She is medically stable and she's not suicidal. This patient to return to see me in approximately 2 months.  Plan:  Medical Decision Making Problem Points:  Established problem, worsening (2) Data Points:  Review of new medications or change in dosage (2)  I certify that inpatient services furnished can reasonably be expected to improve the patient's condition.   [place], [place] 09/06/2013, 4:17 PM

## 2013-09-06 NOTE — Telephone Encounter (Signed)
[place]

## 2013-09-07 ENCOUNTER — Ambulatory Visit (INDEPENDENT_AMBULATORY_CARE_PROVIDER_SITE_OTHER): Payer: PRIVATE HEALTH INSURANCE | Admitting: Nurse Practitioner

## 2013-09-07 ENCOUNTER — Encounter: Payer: Self-pay | Admitting: Nurse Practitioner

## 2013-09-07 VITALS — BP 130/86 | HR 86 | Temp 98.2°F | Resp 10 | Ht 66.0 in | Wt 239.0 lb

## 2013-09-07 DIAGNOSIS — E785 Hyperlipidemia, unspecified: Secondary | ICD-10-CM

## 2013-09-07 DIAGNOSIS — R51 Headache: Secondary | ICD-10-CM

## 2013-09-07 DIAGNOSIS — F329 Major depressive disorder, single episode, unspecified: Secondary | ICD-10-CM

## 2013-09-07 DIAGNOSIS — F341 Dysthymic disorder: Secondary | ICD-10-CM

## 2013-09-07 DIAGNOSIS — R519 Headache, unspecified: Secondary | ICD-10-CM | POA: Insufficient documentation

## 2013-09-07 DIAGNOSIS — K219 Gastro-esophageal reflux disease without esophagitis: Secondary | ICD-10-CM

## 2013-09-07 DIAGNOSIS — G894 Chronic pain syndrome: Secondary | ICD-10-CM

## 2013-09-07 DIAGNOSIS — N3946 Mixed incontinence: Secondary | ICD-10-CM

## 2013-09-07 DIAGNOSIS — F32A Depression, unspecified: Secondary | ICD-10-CM

## 2013-09-07 DIAGNOSIS — F419 Anxiety disorder, unspecified: Secondary | ICD-10-CM

## 2013-09-07 DIAGNOSIS — I10 Essential (primary) hypertension: Secondary | ICD-10-CM

## 2013-09-07 DIAGNOSIS — I252 Old myocardial infarction: Secondary | ICD-10-CM

## 2013-09-07 DIAGNOSIS — E1142 Type 2 diabetes mellitus with diabetic polyneuropathy: Secondary | ICD-10-CM

## 2013-09-07 DIAGNOSIS — E559 Vitamin D deficiency, unspecified: Secondary | ICD-10-CM

## 2013-09-07 DIAGNOSIS — E1149 Type 2 diabetes mellitus with other diabetic neurological complication: Secondary | ICD-10-CM

## 2013-09-07 MED ORDER — PANTOPRAZOLE SODIUM 40 MG PO TBEC: 40.0000 mg | DELAYED_RELEASE_TABLET | Freq: Every day | ORAL | Status: DC

## 2013-09-07 MED ORDER — CLOPIDOGREL BISULFATE 75 MG PO TABS: 75.0000 mg | ORAL_TABLET | Freq: Every day | ORAL | Status: DC

## 2013-09-07 NOTE — Patient Instructions (Addendum)
Make follow up with [name] Will get labs today will check your vit d, cholesterol, diabetes, electrolytes, kidneys, liver and blood count  Will get pain referral Follow up in 3 months with [name]

## 2013-09-07 NOTE — Progress Notes (Signed)
Patient ID: [name], female   DOB: 04-24-1965, 48 y.o.   MRN: [id number]    Allergies  Allergen Reactions  . Codeine Swelling and Other (See Comments)    Swelling and burning of mouth (inside)  . Lactose Intolerance (Gi) Nausea And Vomiting    Chief Complaint  Patient presents with  . Medical Management of Chronic Issues    3 month follow-up     HPI: Patient is a 48 y.o. female PMH of DM, MI, CVA, COPD, anxiety and depression seen in the office today for routine follow up. Husband passed away suddenly on August 14, 2022. Reports she is hanging in there, has family support and goes [place] she has people she can talk to.  -Type 2 diabetes, uncontrolled, with renal manifestation - did not follow up with [facility] due to her husbands death but plans to make follow up. Reports AM blood sugars are between 175-250.  -no hypoglycemic episodes -taking 50 units of lantus twice daily and novolog 3-8 units with meals   - Essential hypertension, benign -not taking blood pressure at home  Overactive bladder- conts on vesicare with good effect   - Hyperlipidemia attempting a heart healthy diet, taking zocor   - Vit D def -conts on vit d 50000 units weekly  -insomnia-worse since husband died, conts on ambien and trazodone, following with psych  Migraines -on Topamax, having headaches, twice a week, lays down lasting 20-30 mins   GERD- -stable on zantac and omeprazole, no GERD but reports nausea at times  Anxiety and depression-follows with psych, symptoms controlled on medications, currently worse since husband has died, reports shes doing ok, no HI or SI  COPD- no recent exacerbations, conts inhalers with good control   Hx of stoke, reports weaker on left sides and needs assistance getting dressed,  No recently seizures, conts on topamax    Review of Systems:  Review of Systems  Constitutional: Negative for weight loss.  HENT: Negative for congestion, ear discharge, ear pain,  hearing loss, nosebleeds and tinnitus.   Eyes: Positive for blurred vision.       Followed with eye doctor, Has glasses now   Respiratory: Positive for cough (ongoing). Negative for shortness of breath.        Hx of sleep apnea still doesn't have machine, following with pulmonary  Cardiovascular: Negative for chest pain, palpitations and leg swelling.  Gastrointestinal: Negative for heartburn, nausea, abdominal pain, diarrhea and constipation (miralax helps).  Genitourinary: Positive for frequency (improved on vesicare). Negative for dysuria and urgency.  Musculoskeletal: Positive for back pain, joint pain (knees) and myalgias. Negative for falls.       Chronic pain, previously pain doctor is now out of network  Skin: Negative for itching and rash.  Neurological: Positive for weakness and headaches (migraines ). Negative for dizziness, tingling, sensory change and seizures (no seizures since 2014).  Endo/Heme/Allergies: Positive for environmental allergies.  Psychiatric/Behavioral: Positive for depression. Negative for suicidal ideas, hallucinations and substance abuse. The patient is nervous/anxious and has insomnia.        Anxiety and depression controlled on medication     Past Medical History  Diagnosis Date  . Hypertension   . Asthma   . Seizures   . Heart murmur     born with   . Dysrhythmia     skipped beats  . Myocardial infarction     hi of x 2  last one 2003   . COPD (chronic obstructive pulmonary disease)  chronic bronchitis   . Pneumonia     hx of 2009  . GERD (gastroesophageal reflux disease)   . Headache(784.0)     migraines   . Neuropathy     due to diabetes   . Dizziness     secondary to diabetes and hypertension   . Arthritis     joint pain   . Hx of blood clots     hematomas removed from left side of brain from 61mo to 89yrold   . Epilepsy idiopathic petit mal     last seizure 2012;controlled w/ topomax  . Diabetic neuropathy, painful     FEET AND  HANDS  . Diabetes mellitus     since age [age]type 2 IDDM  . Sleep apnea     [facility];does not use Cpap ; mild  . MIGRAINE HEADACHE 09/14/2006    Qualifier: Diagnosis of  By: MaHassell DoneNP, NyTori Milks  . Glaucoma     NOT ON ANY EYE DROPS   . Legally blind     left eye   . Stroke     last 2003  RESIDUAL LEFT LEG WEAKNESS--NO OTHER RESIDUAL PROBLEMS  . CVA 11/14/2007    Qualifier: Diagnosis of  By: MaHassell DoneNP, NyTori Milks  . Pain 09/23/11    LOWER BACK AND RIGHT SIDE--PT HAS RIGHT URETERAL STONE  . Nephrolithiasis     frequent urination , urination at nite  PT SEEN IN ER 09/22/11 FOR BACK AND RT SIDED PAIN--HAS KNOWN STONE RT URETER AND UA IN ER SHOWED UTI   Past Surgical History  Procedure Laterality Date  . Kidney stone removal    . Brain hematoma evacutation      five procedures total, first procedure when [age]onths old, last at [age]ears of age.  . Shunt removal      shunt inserted at age [age]emoved at age 48 . Eye surgery    . Retinal detachment surgery  1990  . Vaginal hysterectomy  1996  . Incisional hernia repair  05/05/2011    Procedure: LAPAROSCOPIC INCISIONAL HERNIA;  Surgeon: MaRolm BookbinderMD;  [facility]: General;  Laterality: N/A;  . Mass excision  05/05/2011    Procedure: EXCISION MASS;  Surgeon: MaRolm BookbinderMD;  [facility] Service: General;  Laterality: Right;  . Hernia repair  05/05/11    lap incisional hernia  . Pcnl    . Cystoscopy/retrograde/ureteroscopy  08/24/2011    Procedure: CYSTOSCOPY/RETROGRADE/URETEROSCOPY;  Surgeon: DaMolli HazardMD;  Location: [facility];  Service: Urology;  Laterality: Right;  Cysto, Right retrograde Pyelogram, right stent placement.   . Cystoscopy with ureteroscopy  09/24/2011    Procedure: CYSTOSCOPY WITH URETEROSCOPY;  Surgeon: JoMalka SoMD;  Location: [facility];  Service: Urology;  Laterality: Right;  Balloon dilation right ureter   . Cystoscopy w/ retrogrades  09/24/2011     Procedure: CYSTOSCOPY WITH RETROGRADE PYELOGRAM;  Surgeon: JoMalka SoMD;  Location: [facility];  Service: Urology;  Laterality: Right;   Social History:   reports that she quit smoking about 26 years ago. Her smoking use included Cigarettes. She has a 30 pack-year smoking history. She has never used smokeless tobacco. She reports that she does not drink alcohol or use illicit drugs.  Family History  Problem Relation Age of Onset  . Cancer Mother     uterine  . Hypertension Mother   . Cancer Maternal  Grandmother     brain  . Hypertension Maternal Grandmother   . Anesthesia problems Neg Hx     Medications: Patient's Medications  New Prescriptions   No medications on file  Previous Medications   ALBUTEROL (PROVENTIL HFA;VENTOLIN HFA) 108 (90 BASE) MCG/ACT INHALER    Inhale 2 puffs into the lungs every 6 (six) hours as needed for wheezing or shortness of breath. Wheezing   BACLOFEN (LIORESAL) 10 MG TABLET    Take 1 tablet by mouth three times daily as needed for muscle spasms.   BUDESONIDE-FORMOTEROL (SYMBICORT) 160-4.5 MCG/ACT INHALER    Inhale 2 puffs into the lungs 2 (two) times daily.   CETIRIZINE (ZYRTEC) 10 MG TABLET    Take 10 mg by mouth at bedtime. For allergies   CLOPIDOGREL (PLAVIX) 75 MG TABLET    Take 1 tablet (75 mg total) by mouth daily.   DICLOFENAC (VOLTAREN) 75 MG EC TABLET    Take 75 mg by mouth 2 (two) times daily.   EASY TOUCH PEN NEEDLES 31G X 8 MM MISC    USE AS DIRECTED TO INJECT INSULIN DAILY   FLUOXETINE (PROZAC) 20 MG CAPSULE    Take 3 capsules (60 mg total) by mouth daily at 12 noon.   FLUTICASONE (FLONASE) 50 MCG/ACT NASAL SPRAY    Place 2 sprays into both nostrils daily.   GABAPENTIN (NEURONTIN) 300 MG CAPSULE    TAKE 2 CAPSULES BY MOUTH THREE TIMES A DAY   GLIPIZIDE XL 10 MG 24 HR TABLET    TAKE 2 TABLETS BY MOUTH EVERY DAY WITH BREAKFAST   INSULIN ASPART (NOVOLOG) 100 UNIT/ML INJECTION    Inject 7-8 Units into the skin 3 (three) times daily before meals. 7  units with each meal and if bs is over >300 do 8 units   INSULIN DETEMIR (LEVEMIR) 100 UNIT/ML INJECTION    Inject 50 Units into the skin 2 (two) times daily.    LISINOPRIL (PRINIVIL,ZESTRIL) 10 MG TABLET    Take 1 tablet (10 mg total) by mouth daily.   OMEPRAZOLE (PRILOSEC) 20 MG CAPSULE    Take 1 capsule (20 mg total) by mouth daily. For Hernia and acid reflux   ONETOUCH DELICA LANCETS 70Y MISC    TEST BLOOD SUGAR FOUR TIMES A DAY   PROMETHAZINE (PHENERGAN) 25 MG TABLET    Take 1 tablet (25 mg total) by mouth every 6 (six) hours as needed for nausea.   RANITIDINE (ZANTAC) 150 MG TABLET    Take 1 tablet (150 mg total) by mouth 2 (two) times daily. For Gastritis   SIMVASTATIN (ZOCOR) 20 MG TABLET    Take 1 tablet (20 mg total) by mouth daily. TAKES IN AM, for cholesterol   SOLIFENACIN (VESICARE) 5 MG TABLET    Take one tablet by mouth once daily for overactive bladder--If not effective may increase to Two tablets daily   TOPIRAMATE (TOPAMAX) 200 MG TABLET    TAKE 1 TABLET BY MOUTH TWICE DAILY   TRAZODONE (DESYREL) 150 MG TABLET    Take 1 tablet (150 mg total) by mouth at bedtime. For Sleep   VERAPAMIL (VERELAN PM) 240 MG 24 HR CAPSULE    TAKE 1 CAPSULE BY MOUTH EVERY NIGHT AT BEDTIME FOR BLOOD PRESSURE   VITAMIN D, ERGOCALCIFEROL, (DRISDOL) 50000 UNITS CAPS CAPSULE    Take 50,000 Units by mouth every Thursday.   ZOLPIDEM (AMBIEN) 5 MG TABLET    Take 1 tablet (5 mg total) by mouth at bedtime as needed  for sleep.  Modified Medications   No medications on file  Discontinued Medications   BACLOFEN (LIORESAL) 10 MG TABLET    Take 10 mg by mouth 3 (three) times daily.   GABAPENTIN (NEURONTIN) 300 MG CAPSULE    Take 600 mg by mouth 3 (three) times daily.   GLIPIZIDE (GLUCOTROL XL) 10 MG 24 HR TABLET    Take 20 mg by mouth daily with breakfast.   SOLIFENACIN (VESICARE) 5 MG TABLET    Take 5 mg by mouth 2 (two) times daily.   TOPIRAMATE (TOPAMAX) 200 MG TABLET    Take 200 mg by mouth 2 (two) times daily.    VERAPAMIL (COVERA HS) 240 MG (CO) 24 HR TABLET    Take 240 mg by mouth at bedtime.     Physical Exam:  Filed Vitals:   09/07/13 0832  BP: 130/86  Pulse: 86  Temp: 98.2 F (36.8 C)  TempSrc: Oral  Resp: 10  Height: 5' 6"  (1.676 m)  Weight: 239 lb (108.41 kg)  SpO2: 97%    Physical Exam  Constitutional: She is oriented to person, place, and time and well-developed, well-nourished, and in no distress.  HENT:  Head: Normocephalic and atraumatic.  Nose: Nose normal.  Mouth/Throat: Oropharynx is clear and moist. No oropharyngeal exudate.  Neck: Normal range of motion. Neck supple. No thyromegaly present.  Cardiovascular: Normal rate, regular rhythm, normal heart sounds and intact distal pulses.   Pulmonary/Chest: Effort normal and breath sounds normal. No respiratory distress.  Abdominal: Soft. Bowel sounds are normal. She exhibits no distension.  Musculoskeletal: She exhibits no edema and no tenderness.  Neurological: She is alert and oriented to person, place, and time.  Skin: Skin is warm and dry.  Psychiatric: She has a flat affect.     Labs reviewed: Basic Metabolic Panel:  Recent Labs  05/23/13 0914 06/05/13 0854 06/16/13 1524  NA 137 140 137  K 4.0 4.8 4.3  CL 99 104 105  CO2 18 18 20   GLUCOSE 349* 291* 176*  BUN 15 15 16   CREATININE 0.87 0.84 0.77  CALCIUM 9.3 9.7 9.0   Liver Function Tests:  Recent Labs  02/01/13 1407 02/06/13 1810 05/23/13 0914 06/05/13 0854 06/16/13 1524  AST 12 10 8 13 11   ALT 13 12 14 13 11   ALKPHOS 86 75 99 93 89  BILITOT 0.2* <0.2* <0.2 0.2 <0.2*  PROT 8.1 7.3 6.9 7.2 7.0  ALBUMIN 3.8 3.5  --   --  3.2*    Recent Labs  02/06/13 1810  LIPASE 29   No results found for this basename: AMMONIA,  in the last 8760 hours CBC:  Recent Labs  02/06/13 1810 06/05/13 0854 06/16/13 1524  WBC 7.9 6.2 7.4  NEUTROABS  --  3.9 4.9  HGB 11.9* 13.2 12.3  HCT 36.7 42.2 38.5  MCV 91.8 93 92.5  PLT 249 276 249   Lipid  Panel:  Recent Labs  10/07/12 1745 05/23/13 0914 06/05/13 0854  CHOL 163  --   --   HDL 39* 37* 46  LDLCALC 105* 64 105*  TRIG 94 189* 110  CHOLHDL 4.2 3.8 3.8   TSH: No results found for this basename: TSH,  in the last 8760 hours A1C: Lab Results  Component Value Date   HGBA1C 12.5* 06/05/2013    Assessment/Plan  1. MYOCARDIAL INFARCTION, HX OF - clopidogrel (PLAVIX) 75 MG tablet; Take 1 tablet (75 mg total) by mouth daily.  Dispense: 90 tablet; Refill: 1 -  CBC With differential/Platelet  2. HYPERTENSION - Patients blood pressure appears stable; continue current regimen. Will monitor and make changes as necessary.  3. Gastroesophageal reflux disease without esophagitis -pt on plavix and omeprazole, will stop omeprazole due to interactions and start protonix  - pantoprazole (PROTONIX) 40 MG tablet; Take 1 tablet (40 mg total) by mouth daily.  Dispense: 30 tablet; Refill: 3  4. DM type 2 with diabetic peripheral neuropathy -blood sugars elevated based on pt report, no recent a1c, will update lab and have pt make follow up with cathy  - Comprehensive metabolic panel - Hemoglobin A1c  5. HYPERLIPIDEMIA -on zocor will follow up labs - Lipid panel - Comprehensive metabolic panel  6. Anxiety and depression -worse due to recent death of husband but appears to be coping effectively, has good family support and conts to follow with psych  7. Mixed urge and stress incontinence -improved on vesicare  8. Chronic pain syndrome -and now with worsening migraines. Will referral to Pain Clinic  9. Unspecified vitamin D deficiency -taking vit d 50,000 units daily will follow up vitamin D, 25-hydroxy

## 2013-09-08 ENCOUNTER — Other Ambulatory Visit (HOSPITAL_COMMUNITY): Payer: Self-pay | Admitting: Psychiatry

## 2013-09-08 ENCOUNTER — Other Ambulatory Visit: Payer: Self-pay | Admitting: Internal Medicine

## 2013-09-08 ENCOUNTER — Other Ambulatory Visit: Payer: Self-pay | Admitting: Nurse Practitioner

## 2013-09-08 ENCOUNTER — Other Ambulatory Visit: Payer: Self-pay | Admitting: *Deleted

## 2013-09-08 DIAGNOSIS — M1990 Unspecified osteoarthritis, unspecified site: Secondary | ICD-10-CM

## 2013-09-08 LAB — CBC WITH DIFFERENTIAL
Basophils Absolute: 0 10*3/uL (ref 0.0–0.2)
Basos: 0 %
Eos: 0 %
Eosinophils Absolute: 0 10*3/uL (ref 0.0–0.4)
HCT: 38.2 % (ref 34.0–46.6)
Hemoglobin: 12.6 g/dL (ref 11.1–15.9)
Immature Grans (Abs): 0 10*3/uL (ref 0.0–0.1)
Immature Granulocytes: 0 %
Lymphocytes Absolute: 1.5 10*3/uL (ref 0.7–3.1)
Lymphs: 23 %
MCH: 29 pg (ref 26.6–33.0)
MCHC: 33 g/dL (ref 31.5–35.7)
MCV: 88 fL (ref 79–97)
Monocytes Absolute: 0.5 10*3/uL (ref 0.1–0.9)
Monocytes: 7 %
Neutrophils Absolute: 4.4 10*3/uL (ref 1.4–7.0)
Neutrophils Relative %: 70 %
Platelets: 294 10*3/uL (ref 150–379)
RBC: 4.34 x10E6/uL (ref 3.77–5.28)
RDW: 13.3 % (ref 12.3–15.4)
WBC: 6.4 10*3/uL (ref 3.4–10.8)

## 2013-09-08 LAB — COMPREHENSIVE METABOLIC PANEL
ALT: 14 IU/L (ref 0–32)
AST: 11 IU/L (ref 0–40)
Albumin/Globulin Ratio: 1.4 (ref 1.1–2.5)
Albumin: 4.1 g/dL (ref 3.5–5.5)
Alkaline Phosphatase: 83 IU/L (ref 39–117)
BUN/Creatinine Ratio: 18 (ref 9–23)
BUN: 15 mg/dL (ref 6–24)
CO2: 21 mmol/L (ref 18–29)
Calcium: 9.4 mg/dL (ref 8.7–10.2)
Chloride: 103 mmol/L (ref 97–108)
Creatinine, Ser: 0.82 mg/dL (ref 0.57–1.00)
GFR calc Af Amer: 98 mL/min/{1.73_m2} (ref >59)
GFR calc non Af Amer: 85 mL/min/{1.73_m2} (ref >59)
Globulin, Total: 3 g/dL (ref 1.5–4.5)
Glucose: 201 mg/dL — ABNORMAL HIGH (ref 65–99)
Potassium: 4.5 mmol/L (ref 3.5–5.2)
Sodium: 138 mmol/L (ref 134–144)
Total Bilirubin: 0.2 mg/dL (ref 0.0–1.2)
Total Protein: 7.1 g/dL (ref 6.0–8.5)

## 2013-09-08 LAB — LIPID PANEL
Chol/HDL Ratio: 3.7 ratio units (ref 0.0–4.4)
Cholesterol, Total: 159 mg/dL (ref 100–199)
HDL: 43 mg/dL (ref >39)
LDL Calculated: 98 mg/dL (ref 0–99)
Triglycerides: 90 mg/dL (ref 0–149)
VLDL Cholesterol Cal: 18 mg/dL (ref 5–40)

## 2013-09-08 LAB — HEMOGLOBIN A1C
Est. average glucose Bld gHb Est-mCnc: 235 mg/dL
Hgb A1c MFr Bld: 9.8 % — ABNORMAL HIGH (ref 4.8–5.6)

## 2013-09-08 LAB — VITAMIN D 25 HYDROXY (VIT D DEFICIENCY, FRACTURES): Vit D, 25-Hydroxy: 27 ng/mL — ABNORMAL LOW (ref 30.0–100.0)

## 2013-09-08 MED ORDER — IBUPROFEN 600 MG PO TABS
600.0000 mg | ORAL_TABLET | Freq: Three times a day (TID) | ORAL | Status: DC | PRN
Start: 2013-09-08 — End: 2013-09-26

## 2013-09-08 MED ORDER — IBUPROFEN 600 MG PO TABS: 600.0000 mg | ORAL_TABLET | Freq: Three times a day (TID) | ORAL | Status: DC | PRN

## 2013-09-08 NOTE — Progress Notes (Signed)
Yes, will send

## 2013-09-08 NOTE — Telephone Encounter (Signed)
rx resent to the [facility] and canceled the mail order rx.

## 2013-09-23 ENCOUNTER — Encounter (HOSPITAL_COMMUNITY): Payer: Self-pay | Admitting: Emergency Medicine

## 2013-09-23 ENCOUNTER — Emergency Department (HOSPITAL_COMMUNITY)
Admission: EM | Admit: 2013-09-23 | Discharge: 2013-09-23 | Disposition: A | Discharge status: Home / Self Care | Payer: Medicare Other | Attending: Emergency Medicine | Admitting: Emergency Medicine

## 2013-09-23 DIAGNOSIS — I252 Old myocardial infarction: Secondary | ICD-10-CM | POA: Insufficient documentation

## 2013-09-23 DIAGNOSIS — I10 Essential (primary) hypertension: Secondary | ICD-10-CM | POA: Diagnosis not present

## 2013-09-23 DIAGNOSIS — E1142 Type 2 diabetes mellitus with diabetic polyneuropathy: Secondary | ICD-10-CM | POA: Insufficient documentation

## 2013-09-23 DIAGNOSIS — L03011 Cellulitis of right finger: Secondary | ICD-10-CM

## 2013-09-23 DIAGNOSIS — Z87442 Personal history of urinary calculi: Secondary | ICD-10-CM | POA: Insufficient documentation

## 2013-09-23 DIAGNOSIS — J45901 Unspecified asthma with (acute) exacerbation: Secondary | ICD-10-CM | POA: Diagnosis not present

## 2013-09-23 DIAGNOSIS — H548 Legal blindness, as defined in USA: Secondary | ICD-10-CM | POA: Insufficient documentation

## 2013-09-23 DIAGNOSIS — K219 Gastro-esophageal reflux disease without esophagitis: Secondary | ICD-10-CM | POA: Diagnosis not present

## 2013-09-23 DIAGNOSIS — M129 Arthropathy, unspecified: Secondary | ICD-10-CM | POA: Insufficient documentation

## 2013-09-23 DIAGNOSIS — R011 Cardiac murmur, unspecified: Secondary | ICD-10-CM | POA: Diagnosis not present

## 2013-09-23 DIAGNOSIS — Z8701 Personal history of pneumonia (recurrent): Secondary | ICD-10-CM | POA: Insufficient documentation

## 2013-09-23 DIAGNOSIS — L03019 Cellulitis of unspecified finger: Secondary | ICD-10-CM | POA: Diagnosis not present

## 2013-09-23 DIAGNOSIS — J441 Chronic obstructive pulmonary disease with (acute) exacerbation: Secondary | ICD-10-CM | POA: Insufficient documentation

## 2013-09-23 DIAGNOSIS — IMO0002 Reserved for concepts with insufficient information to code with codable children: Secondary | ICD-10-CM | POA: Insufficient documentation

## 2013-09-23 DIAGNOSIS — M79609 Pain in unspecified limb: Secondary | ICD-10-CM | POA: Diagnosis present

## 2013-09-23 DIAGNOSIS — Z791 Long term (current) use of non-steroidal anti-inflammatories (NSAID): Secondary | ICD-10-CM | POA: Insufficient documentation

## 2013-09-23 DIAGNOSIS — G43909 Migraine, unspecified, not intractable, without status migrainosus: Secondary | ICD-10-CM | POA: Insufficient documentation

## 2013-09-23 DIAGNOSIS — Z794 Long term (current) use of insulin: Secondary | ICD-10-CM | POA: Insufficient documentation

## 2013-09-23 DIAGNOSIS — Z8673 Personal history of transient ischemic attack (TIA), and cerebral infarction without residual deficits: Secondary | ICD-10-CM | POA: Insufficient documentation

## 2013-09-23 DIAGNOSIS — E1149 Type 2 diabetes mellitus with other diabetic neurological complication: Secondary | ICD-10-CM | POA: Diagnosis not present

## 2013-09-23 DIAGNOSIS — Z86718 Personal history of other venous thrombosis and embolism: Secondary | ICD-10-CM | POA: Diagnosis not present

## 2013-09-23 DIAGNOSIS — Z7902 Long term (current) use of antithrombotics/antiplatelets: Secondary | ICD-10-CM | POA: Insufficient documentation

## 2013-09-23 DIAGNOSIS — Z79899 Other long term (current) drug therapy: Secondary | ICD-10-CM | POA: Diagnosis not present

## 2013-09-23 DIAGNOSIS — Z87891 Personal history of nicotine dependence: Secondary | ICD-10-CM | POA: Diagnosis not present

## 2013-09-23 LAB — CBG MONITORING, ED: Glucose-Capillary: 239 mg/dL — ABNORMAL HIGH (ref 70–99)

## 2013-09-23 MED ORDER — LIDOCAINE HCL 2 % IJ SOLN
INTRAMUSCULAR | Status: AC
  Administered 2013-09-23: 19:00:00
  Filled 2013-09-23: qty 20

## 2013-09-23 NOTE — Discharge Instructions (Signed)
Paronychia Paronychia is an inflammatory reaction involving the folds of the skin surrounding the fingernail. This is commonly caused by an infection in the skin around a nail. The most common cause of paronychia is frequent wetting of the hands (as seen with bartenders, food servers, nurses or others who wet their hands). This makes the skin around the fingernail susceptible to infection by bacteria (germs) or fungus. Other predisposing factors are:  Aggressive manicuring.  Nail biting.  Thumb sucking. The most common cause is a staphylococcal (a type of germ) infection, or a fungal (Candida) infection. When caused by a germ, it usually comes on suddenly with redness, swelling, pus and is often painful. It may get under the nail and form an abscess (collection of pus), or form an abscess around the nail. If the nail itself is infected with a fungus, the treatment is usually prolonged and may require oral medicine for up to one year. Your caregiver will determine the length of time treatment is required. The paronychia caused by bacteria (germs) may largely be avoided by not pulling on hangnails or picking at cuticles. When the infection occurs at the tips of the finger it is called felon. When the cause of paronychia is from the herpes simplex virus (HSV) it is called herpetic whitlow. TREATMENT  When an abscess is present treatment is often incision and drainage. This means that the abscess must be cut open so the pus can get out. When this is done, the following home care instructions should be followed. HOME CARE INSTRUCTIONS   It is important to keep the affected fingers very dry. Rubber or plastic gloves over cotton gloves should be used whenever the hand must be placed in water.  Keep wound clean, dry and dressed as suggested by your caregiver between warm soaks or warm compresses.  Soak in warm water for fifteen to twenty minutes three to four times per day for bacterial infections. Fungal  infections are very difficult to treat, so often require treatment for long periods of time.  For bacterial (germ) infections take antibiotics (medicine which kill germs) as directed and finish the prescription, even if the problem appears to be solved before the medicine is gone.  Only take over-the-counter or prescription medicines for pain, discomfort, or fever as directed by your caregiver. SEEK IMMEDIATE MEDICAL CARE IF:  You have redness, swelling, or increasing pain in the wound.  You notice pus coming from the wound.  You have a fever.  You notice a bad smell coming from the wound or dressing. Document Released: 07/01/2000 Document Revised: 03/30/2011 Document Reviewed: 03/02/2008 Exit[employer] Patient Information 2015 [employer]. This information is not intended to replace advice given to you by your health care provider. Make sure you discuss any questions you have with your health care provider.  

## 2013-09-23 NOTE — ED Notes (Signed)
Pt arrived to the ED with a complaint of finger pain.  Pt has pain located in her right index finger.  Finger appears red and swollen.  Pt states pain began Thursday last.  Pt states she took Ibuprofen 600 mg with out relief.

## 2013-09-23 NOTE — ED Notes (Signed)
Pt from home reports right 2nd digit swelling around the nail bed with clear pus drainage x 2 days. Pt is A&O and in NAD

## 2013-09-23 NOTE — ED Provider Notes (Signed)
CSN: [id number]     Arrival date & time 09/23/13  1744 History   First MD Initiated Contact with Patient 09/23/13 1844     Chief Complaint  Patient presents with  . Hand Pain     (Consider location/radiation/quality/duration/timing/severity/associated sxs/prior Treatment) HPI Pt is a 48yo female wit hx of diabetes, CBG runs in upper 100s-200s, presenting to ED with c/o gradually worsening throbbing pain, swelling and redness at the end of her right index finger. Pain is 10/10 at worst. Pt states pain started 3 days ago.  She has been taking ibuprofen w/o relief. Denies hx of of similar symptoms. No hx of injury to finger.  Denies fever, n/v/d.   Past Medical History  Diagnosis Date  . Hypertension   . Asthma   . Seizures   . Heart murmur     born with   . Dysrhythmia     skipped beats  . Myocardial infarction     hi of x 2  last one 2003   . COPD (chronic obstructive pulmonary disease)     chronic bronchitis   . Pneumonia     hx of 2009  . GERD (gastroesophageal reflux disease)   . Headache(784.0)     migraines   . Neuropathy     due to diabetes   . Dizziness     secondary to diabetes and hypertension   . Arthritis     joint pain   . Hx of blood clots     hematomas removed from left side of brain from 69mo to 89yrold   . Epilepsy idiopathic petit mal     last seizure 2012;controlled w/ topomax  . Diabetic neuropathy, painful     FEET AND HANDS  . Diabetes mellitus     since age [age]type 2 IDDM  . Sleep apnea     [facility];does not use Cpap ; mild  . MIGRAINE HEADACHE 09/14/2006    Qualifier: Diagnosis of  By: MaHassell DoneNP, NyTori Milks  . Glaucoma     NOT ON ANY EYE DROPS   . Legally blind     left eye   . Stroke     last 2003  RESIDUAL LEFT LEG WEAKNESS--NO OTHER RESIDUAL PROBLEMS  . CVA 11/14/2007    Qualifier: Diagnosis of  By: MaHassell DoneNP, NyTori Milks  . Pain 09/23/11    LOWER BACK AND RIGHT SIDE--PT HAS RIGHT URETERAL STONE  .  Nephrolithiasis     frequent urination , urination at nite  PT SEEN IN ER 09/22/11 FOR BACK AND RT SIDED PAIN--HAS KNOWN STONE RT URETER AND UA IN ER SHOWED UTI   Past Surgical History  Procedure Laterality Date  . Kidney stone removal    . Brain hematoma evacutation      five procedures total, first procedure when [age]onths old, last at [age]ears of age.  . Shunt removal      shunt inserted at age [age]emoved at age 48 . Eye surgery    . Retinal detachment surgery  1990  . Vaginal hysterectomy  1996  . Incisional hernia repair  05/05/2011    Procedure: LAPAROSCOPIC INCISIONAL HERNIA;  Surgeon: MaRolm BookbinderMD;  [facility] Service: General;  Laterality: N/A;  . Mass excision  05/05/2011    Procedure: EXCISION MASS;  Surgeon: MaRolm BookbinderMD;  [facility] Service: General;  Laterality: Right;  . Hernia repair  05/05/11  lap incisional hernia  . Pcnl    . Cystoscopy/retrograde/ureteroscopy  08/24/2011    Procedure: CYSTOSCOPY/RETROGRADE/URETEROSCOPY;  Surgeon: Molli Hazard, MD;  Location: [facility];  Service: Urology;  Laterality: Right;  Cysto, Right retrograde Pyelogram, right stent placement.   . Cystoscopy with ureteroscopy  09/24/2011    Procedure: CYSTOSCOPY WITH URETEROSCOPY;  Surgeon: Malka So, MD;  Location: [facility];  Service: Urology;  Laterality: Right;  Balloon dilation right ureter   . Cystoscopy w/ retrogrades  09/24/2011    Procedure: CYSTOSCOPY WITH RETROGRADE PYELOGRAM;  Surgeon: Malka So, MD;  Location: [facility];  Service: Urology;  Laterality: Right;   Family History  Problem Relation Age of Onset  . Cancer Mother     uterine  . Hypertension Mother   . Cancer Maternal Grandmother     brain  . Hypertension Maternal Grandmother   . Anesthesia problems Neg Hx    History  Substance Use Topics  . Smoking status: Former Smoker -- 1.00 packs/day for 30 years    Types: Cigarettes    Quit date: 01/20/1987  . Smokeless tobacco: Never Used      Comment: quit more than 20years  . Alcohol Use: No   OB History   Grav Para Term Preterm Abortions TAB SAB Ect Mult Living   3 2 2  1  1   2      Review of Systems  Constitutional: Negative for fever and chills.  Gastrointestinal: Negative for nausea and vomiting.  Musculoskeletal: Positive for arthralgias and joint swelling.       Distal right index finger  Skin: Positive for color change and wound.  All other systems reviewed and are negative.     Allergies  Codeine and Lactose intolerance (gi)  Home Medications   Prior to Admission medications   Medication Sig Start Date End Date Taking? Authorizing Provider  albuterol (PROVENTIL HFA;VENTOLIN HFA) 108 (90 BASE) MCG/ACT inhaler Inhale 2 puffs into the lungs every 6 (six) hours as needed for wheezing or shortness of breath. Wheezing 04/13/13   Pricilla Larsson, NP  baclofen (LIORESAL) 10 MG tablet Take 1 tablet by mouth three times daily as needed for muscle spasms. 08/14/13   Tiffany L Reed, DO  budesonide-formoterol (SYMBICORT) 160-4.5 MCG/ACT inhaler Inhale 2 puffs into the lungs 2 (two) times daily. 04/13/13   Pricilla Larsson, NP  cetirizine (ZYRTEC) 10 MG tablet Take 10 mg by mouth at bedtime. For allergies    Historical Provider, MD  clopidogrel (PLAVIX) 75 MG tablet Take 1 tablet (75 mg total) by mouth daily. 09/07/13   Pricilla Larsson, NP  diclofenac (VOLTAREN) 75 MG EC tablet Take 75 mg by mouth 2 (two) times daily.    Historical Provider, MD  EASY TOUCH PEN NEEDLES 31G X 8 MM MISC USE AS DIRECTED TO INJECT INSULIN DAILY 07/07/13   Pricilla Larsson, NP  FLUoxetine (PROZAC) 20 MG capsule Take 3 capsules (60 mg total) by mouth daily at 12 noon. 09/06/13   Norma Fredrickson, MD  fluticasone (FLONASE) 50 MCG/ACT nasal spray Place 2 sprays into both nostrils daily.    Historical Provider, MD  gabapentin (NEURONTIN) 300 MG capsule TAKE 2 CAPSULES BY MOUTH THREE TIMES A DAY    Tiffany L Reed, DO  GLIPIZIDE XL 10 MG 24 hr tablet TAKE 2  TABLETS BY MOUTH EVERY DAY WITH BREAKFAST 07/07/13   Pricilla Larsson, NP  ibuprofen (ADVIL,MOTRIN) 600 MG tablet Take 1 tablet (600 mg total) by  mouth every 8 (eight) hours as needed. 09/08/13   Pricilla Larsson, NP  insulin aspart (NOVOLOG) 100 UNIT/ML injection Inject 7-8 Units into the skin 3 (three) times daily before meals. 7 units with each meal and if bs is over >300 do 8 units    Historical Provider, MD  insulin detemir (LEVEMIR) 100 UNIT/ML injection Inject 50 Units into the skin 2 (two) times daily.     Historical Provider, MD  LEVEMIR FLEXTOUCH 100 UNIT/ML Pen INJECT 45 UNITS EVERY MORNING AND 45 UNITS EVERY NIGHT AT BEDTIME 09/11/13   Tiffany L Reed, DO  lisinopril (PRINIVIL,ZESTRIL) 10 MG tablet Take 1 tablet (10 mg total) by mouth daily. 06/07/13   Pricilla Larsson, NP  ONETOUCH DELICA LANCETS 34L MISC TEST BLOOD SUGAR FOUR TIMES A DAY 07/07/13   Pricilla Larsson, NP  pantoprazole (PROTONIX) 40 MG tablet Take 1 tablet (40 mg total) by mouth daily. 09/07/13   Pricilla Larsson, NP  promethazine (PHENERGAN) 25 MG tablet Take 1 tablet (25 mg total) by mouth every 6 (six) hours as needed for nausea. 10/12/11   Carmin Muskrat, MD  ranitidine (ZANTAC) 150 MG tablet TAKE 1 TABLET BY MOUTH TWICE DAILY 09/11/13   Tiffany L Reed, DO  simvastatin (ZOCOR) 20 MG tablet Take 1 tablet (20 mg total) by mouth daily. TAKES IN AM, for cholesterol 04/13/13   Pricilla Larsson, NP  solifenacin (VESICARE) 5 MG tablet Take one tablet by mouth once daily for overactive bladder--If not effective may increase to Two tablets daily 09/06/13   Estill Dooms, MD  topiramate (TOPAMAX) 200 MG tablet TAKE 1 TABLET BY MOUTH TWICE DAILY    Tiffany L Reed, DO  traZODone (DESYREL) 150 MG tablet Take 1 tablet (150 mg total) by mouth at bedtime. For Sleep 09/06/13   Norma Fredrickson, MD  verapamil (VERELAN PM) 240 MG 24 hr capsule TAKE 1 CAPSULE BY MOUTH EVERY NIGHT AT BEDTIME FOR BLOOD PRESSURE    Tiffany L Reed, DO  Vitamin D,  Ergocalciferol, (DRISDOL) 50000 UNITS CAPS capsule Take 50,000 Units by mouth every Thursday.    Historical Provider, MD  zolpidem (AMBIEN) 5 MG tablet Take 1 tablet (5 mg total) by mouth at bedtime as needed for sleep. 09/06/13   Norma Fredrickson, MD   BP 129/91  Pulse 73  Temp(Src) 97.9 F (36.6 C) (Oral)  Resp 20  Ht 5' 6"  (1.676 m)  Wt 240 lb (108.863 kg)  BMI 38.76 kg/m2  SpO2 100% Physical Exam  Nursing note and vitals reviewed. Constitutional: She is oriented to person, place, and time. She appears well-developed and well-nourished.  HENT:  Head: Normocephalic and atraumatic.  Eyes: EOM are normal.  Neck: Normal range of motion.  Cardiovascular: Normal rate.   Pulmonary/Chest: Effort normal.  Musculoskeletal: Normal range of motion.  Right index finger: FROM, cap refill <3 seconds.   Neurological: She is alert and oriented to person, place, and time.  Sensation to light and sharp touch in tact to right index finger.  Skin: Skin is warm and dry.  Distal right index finger: erythema and fluctuance with area of white skin along nailbed. Tender to touch. No active discharge. C/w paronychia.   Psychiatric: She has a normal mood and affect. Her behavior is normal.    ED Course  Procedures  INCISION AND DRAINAGE Performed by: Noland Fordyce A. Consent: Verbal consent obtained. Risks and benefits: risks, benefits and alternatives were discussed Type: abscess  Body area: distal right index  finger  Anesthesia: local infiltration  Incision was made with a scalpel.  Local anesthetic: lidocaine 2% without epinephrine  Anesthetic total: 0.5 ml  Complexity: complex Blunt dissection to break up loculations  Drainage: purulent  Drainage amount: 2cc  Packing material: none  Bacitracin and bandage placed  Patient tolerance: Patient tolerated the procedure well with no immediate complications.     Labs Review Labs Reviewed  CBG MONITORING, ED - Abnormal; Notable for  the following:    Glucose-Capillary 239 (*)    All other components within normal limits    Imaging Review No results found.   EKG Interpretation None      MDM   Final diagnoses:  Paronychia, finger, right    Pt is a 48yo female presenting to ED with paronychia to right index finger, I&D performed without immediate complication. Wound bandaged. Home care instructions provided. Advised to f/u with PCP for recheck of symptoms in 2-3 days as needed.  Pt did have elevated BP in ED, however she does have hx of HTN, currently taking medication. BP initially 148/104, down to 129/91.  Return precautions provided. Pt verbalized understanding and agreement with tx plan.     Noland Fordyce, [name] 09/23/13 [phone]

## 2013-09-26 ENCOUNTER — Other Ambulatory Visit: Payer: Self-pay | Admitting: Nurse Practitioner

## 2013-09-27 NOTE — ED Provider Notes (Signed)
Medical screening examination/treatment/procedure(s) were performed by non-physician practitioner and as supervising physician I was immediately available for consultation/collaboration.   EKG Interpretation None        Ephraim Hamburger, MD 09/27/13 [phone]

## 2013-10-02 ENCOUNTER — Ambulatory Visit (INDEPENDENT_AMBULATORY_CARE_PROVIDER_SITE_OTHER): Payer: PRIVATE HEALTH INSURANCE | Admitting: Pharmacotherapy

## 2013-10-02 ENCOUNTER — Encounter: Payer: Self-pay | Admitting: Pharmacotherapy

## 2013-10-02 VITALS — BP 154/98 | HR 73 | Temp 97.6°F | Wt 244.0 lb

## 2013-10-02 DIAGNOSIS — R809 Proteinuria, unspecified: Secondary | ICD-10-CM

## 2013-10-02 DIAGNOSIS — I10 Essential (primary) hypertension: Secondary | ICD-10-CM

## 2013-10-02 DIAGNOSIS — E1165 Type 2 diabetes mellitus with hyperglycemia: Secondary | ICD-10-CM

## 2013-10-02 DIAGNOSIS — E1129 Type 2 diabetes mellitus with other diabetic kidney complication: Secondary | ICD-10-CM

## 2013-10-02 DIAGNOSIS — IMO0002 Reserved for concepts with insufficient information to code with codable children: Secondary | ICD-10-CM

## 2013-10-02 DIAGNOSIS — R32 Unspecified urinary incontinence: Secondary | ICD-10-CM

## 2013-10-02 MED ORDER — INSULIN ASPART 100 UNIT/ML FLEXPEN: PEN_INJECTOR | SUBCUTANEOUS | Status: DC

## 2013-10-02 MED ORDER — SOLIFENACIN SUCCINATE 10 MG PO TABS: 10.0000 mg | ORAL_TABLET | Freq: Every day | ORAL | Status: DC

## 2013-10-02 MED ORDER — INSULIN DETEMIR 100 UNIT/ML FLEXPEN: 55.0000 [IU] | PEN_INJECTOR | Freq: Two times a day (BID) | SUBCUTANEOUS | Status: DC

## 2013-10-02 MED ORDER — LISINOPRIL 20 MG PO TABS: 20.0000 mg | ORAL_TABLET | Freq: Every day | ORAL | Status: DC

## 2013-10-02 NOTE — Patient Instructions (Signed)
1.  Increase Levemir to 55 units twice daily. 2.  Increase Lisinopril 59m once daily. 3.  Increase Vesicare 196monce daily

## 2013-10-02 NOTE — Progress Notes (Signed)
  Subjective:    [name] is a 48 y.o.African American female who presents for follow-up of Type 2 diabetes mellitus.   Last OV increased her insulin doses. She went to the ER 09/23/13 - right index finger I & D performed.  She was given Bacitracin to apply three times per day.  She forgot her blood glucose meter. Reports BG in the 200's No hypoglycemia.  Blind in left eye.  She has glaucoma in right eye.  Annual exam current. Some burning and tingling in feet.  Checks feet daily. Nocturia 3-4 times per night.  She is asking to increase her Vesicare to 47m daily.  Having some urinary incontinence.  Making healthy food choices.  Not skipping meals. Walking for exercise.  Walks around the yard twice a day (10 min) Exercise is limited due to leg pain.  Review of Systems A comprehensive review of systems was negative except for: Eyes: positive for glaucoma and blind in left eye Genitourinary: positive for frequency, nocturia and urinary incontinence Neurological: positive for tingling in feet    Objective:    BP 154/98  Pulse 73  Temp(Src) 97.6 F (36.4 C) (Oral)  Wt 244 lb (110.678 kg)  SpO2 95%  General:  alert, cooperative and no distress  Oropharynx: normal findings: lips normal without lesions and gums healthy   Eyes:  negative findings: lids and lashes normal   Left eye does not focus.   Ears:  external ears normal        Lung: clear to auscultation bilaterally  Heart:  regular rate and rhythm     Extremities: no edema noted  Skin: dry     Neuro: mental status, speech normal, alert and oriented x3 and gait and station normal   Lab Review Glucose (mg/dL)  Date Value  09/07/2013 201*  06/05/2013 291*  05/23/2013 349*     Glucose, Bld (mg/dL)  Date Value  06/16/2013 176*  02/06/2013 197*  02/01/2013 102*     CO2 (mmol/L)  Date Value  09/07/2013 21   06/16/2013 20   06/05/2013 18      BUN (mg/dL)  Date Value  09/07/2013 15   06/16/2013 16   06/05/2013  15   05/23/2013 15   02/06/2013 15   02/01/2013 14      Creat (mg/dL)  Date Value  10/07/2012 0.89      Creatinine, Ser (mg/dL)  Date Value  09/07/2013 0.82   06/16/2013 0.77   06/05/2013 0.84    09/07/13: A1C 12.5%  Assessment:    Diabetes Mellitus type II, under poor control.  - but improving. HTN above target <140/90 Continues to struggle with urinary incontinence on Vesicare 552mdaily.   Plan:    1.  Rx changes: 1.  Increase Levemir 55 units twice daily.  2.  Increase Lisinopril 2084maily.  3.  Increase Vesicare 25m2mily. 2.  Continue Novolog 10-12 units with meals (12 units if BG >300). 3.  Continue glipizide.  Will probably stop this with next OV. 4.  Reviewed nutrition goals. 5.  Praised exercise efforts.  Goal is 30-45 minutes 5 x week. 6.  BP above target <140/90.  Increase Lisinopril 20mg82mly.  Continue verapamil. 7.  Continues to have episodes of urinary incontinence with 5mg V107mcare.  Increase to 25mg d4m. 8.  RTC in 1 month

## 2013-10-03 ENCOUNTER — Other Ambulatory Visit: Payer: Self-pay | Admitting: Nurse Practitioner

## 2013-10-09 ENCOUNTER — Ambulatory Visit: Payer: Medicare Other | Attending: Anesthesiology

## 2013-10-20 ENCOUNTER — Other Ambulatory Visit: Payer: Self-pay | Admitting: Internal Medicine

## 2013-10-30 ENCOUNTER — Encounter: Payer: Self-pay | Admitting: Pharmacotherapy

## 2013-10-30 ENCOUNTER — Ambulatory Visit (INDEPENDENT_AMBULATORY_CARE_PROVIDER_SITE_OTHER): Payer: PRIVATE HEALTH INSURANCE | Admitting: Pharmacotherapy

## 2013-10-30 VITALS — BP 134/90 | HR 83 | Resp 12 | Ht 66.0 in | Wt 239.0 lb

## 2013-10-30 DIAGNOSIS — Z23 Encounter for immunization: Secondary | ICD-10-CM

## 2013-10-30 DIAGNOSIS — E1129 Type 2 diabetes mellitus with other diabetic kidney complication: Secondary | ICD-10-CM

## 2013-10-30 DIAGNOSIS — I10 Essential (primary) hypertension: Secondary | ICD-10-CM

## 2013-10-30 DIAGNOSIS — E1165 Type 2 diabetes mellitus with hyperglycemia: Secondary | ICD-10-CM

## 2013-10-30 DIAGNOSIS — IMO0002 Reserved for concepts with insufficient information to code with codable children: Secondary | ICD-10-CM

## 2013-10-30 MED ORDER — INSULIN ASPART 100 UNIT/ML FLEXPEN: PEN_INJECTOR | SUBCUTANEOUS | Status: DC

## 2013-10-30 MED ORDER — TETANUS-DIPHTH-ACELL PERTUSSIS 5-2.5-18.5 LF-MCG/0.5 IM SUSP: 0.5000 mL | Freq: Once | INTRAMUSCULAR | Status: DC

## 2013-10-30 MED ORDER — INSULIN DETEMIR 100 UNIT/ML FLEXPEN: PEN_INJECTOR | SUBCUTANEOUS | Status: DC

## 2013-10-30 NOTE — Patient Instructions (Signed)
Stop glipizide - not helping. Increase Levemir 55 units breakfast, 20 units lunch, 55 units supper. Increase Novolog 14 units with each meal (16 units if sugar >300)

## 2013-10-30 NOTE — Progress Notes (Signed)
  Subjective:    [name] is a 48 y.o.African American female who presents for follow-up of Type 2 diabetes mellitus.   Last month we increased her Levemir to 55 units twice daily.  Continued Novolog 10-12 units with meals. We also increased her Lisinopril to 20m daily. [facility].  She has glucose in her urine.  Denies missed doses of insulin. One of her god-daughters is a nAdministrator, sports- helps her daily with her medications. Average BG:  2730mdl No hypoglycemia.  Lowest BG:  16374ml  Denies problems with feet. She is blind in left eye. Nocturia 4-5 times per night. Does have some peripheral edema.  Trying to eat healthy.  Not skipping meals. Walks for exercise twice a day for 2-3 minutes.   Review of Systems A comprehensive review of systems was negative except for: Eyes: positive for visual disturbance Cardiovascular: positive for lower extremity edema Genitourinary: positive for nocturia    Objective:    BP 134/90  Pulse 83  Resp 12  Ht 5' 6"  (1.676 m)  Wt 239 lb (108.41 kg)  BMI 38.59 kg/m2  SpO2 95%  General:  alert, cooperative and no distress  Oropharynx: normal findings: lips normal without lesions and gums healthy   Eyes:  negative findings: lids and lashes normal and conjunctivae and sclerae normal   Ears:  external ears normal        Lung: clear to auscultation bilaterally  Heart:  regular rate and rhythm     Extremities: edema lower extremities  Skin: warm and dry, no hyperpigmentation, vitiligo, or suspicious lesions     Neuro: mental status, speech normal, alert and oriented x3 and gait and station normal   Lab Review Glucose (mg/dL)  Date Value  09/07/2013 201*  06/05/2013 291*  05/23/2013 349*     Glucose, Bld (mg/dL)  Date Value  06/16/2013 176*  02/06/2013 197*  02/01/2013 102*     CO2 (mmol/L)  Date Value  09/07/2013 21   06/16/2013 20   06/05/2013 18      BUN (mg/dL)  Date Value  09/07/2013  15   06/16/2013 16   06/05/2013 15   05/23/2013 15   02/06/2013 15   02/01/2013 14      Creat (mg/dL)  Date Value  10/07/2012 0.89      Creatinine, Ser (mg/dL)  Date Value  09/07/2013 0.82   06/16/2013 0.77   06/05/2013 0.84        Assessment:    Diabetes Mellitus type II, under fair control. Average BG still too high, but improving. BP at goal <140/90   Plan:    1.  Rx changes: 1.  Stop glipizide as it is probably not helping.  2.  Increase Levemir to 55 units breakfast, 20 units lunch, 55 units supper.  3.  Increase Novolog to 14 units with each meal, 16 units if BG >300. 2.  Counseled on hypoglycemia. 3.  Counseled on nutrition goals. 4.  Exercise goal is ultimately 30-45 minutes 5 x week.  Asked her to increase frequency of her short walks to 3 times per day. 5.  BP at goal since increase of Lisinopril. 6.  RTC in 1 month.

## 2013-11-09 ENCOUNTER — Other Ambulatory Visit: Payer: Self-pay | Admitting: Internal Medicine

## 2013-11-13 ENCOUNTER — Encounter: Payer: Self-pay | Admitting: *Deleted

## 2013-11-13 ENCOUNTER — Telehealth: Payer: Self-pay | Admitting: *Deleted

## 2013-11-13 NOTE — Telephone Encounter (Signed)
Per Cathy---Call patient and asked if insurance will cover Humalog.  I called patient and she stated that the insurance company letter stated that they will not cover Novolog nor Humalog. Informed cathy and she wants the patient to bring in the letter. Patient informed and stated that she will drop it off.

## 2013-11-13 NOTE — Telephone Encounter (Signed)
Patient called and stated that the insurance will no longer pay for the Novolog Sliding Scale Flexpen. Please Advise. Printed and given to [place] to address.

## 2013-11-13 NOTE — Telephone Encounter (Signed)
error 

## 2013-11-15 ENCOUNTER — Ambulatory Visit (HOSPITAL_COMMUNITY): Payer: Self-pay | Admitting: Psychiatry

## 2013-11-20 ENCOUNTER — Ambulatory Visit (INDEPENDENT_AMBULATORY_CARE_PROVIDER_SITE_OTHER): Payer: PRIVATE HEALTH INSURANCE | Admitting: Pharmacotherapy

## 2013-11-20 ENCOUNTER — Encounter: Payer: Self-pay | Admitting: Pharmacotherapy

## 2013-11-20 VITALS — BP 140/78 | HR 89 | Resp 10 | Ht 66.0 in | Wt 235.0 lb

## 2013-11-20 DIAGNOSIS — E1129 Type 2 diabetes mellitus with other diabetic kidney complication: Secondary | ICD-10-CM

## 2013-11-20 DIAGNOSIS — IMO0002 Reserved for concepts with insufficient information to code with codable children: Secondary | ICD-10-CM

## 2013-11-20 DIAGNOSIS — I10 Essential (primary) hypertension: Secondary | ICD-10-CM

## 2013-11-20 DIAGNOSIS — E1165 Type 2 diabetes mellitus with hyperglycemia: Secondary | ICD-10-CM

## 2013-11-20 MED ORDER — INSULIN DETEMIR 100 UNIT/ML FLEXPEN: PEN_INJECTOR | SUBCUTANEOUS | Status: DC

## 2013-11-20 MED ORDER — INSULIN LISPRO 200 UNIT/ML ~~LOC~~ SOPN: 20.0000 [IU] | PEN_INJECTOR | Freq: Three times a day (TID) | SUBCUTANEOUS | Status: DC

## 2013-11-20 NOTE — Progress Notes (Signed)
  Subjective:    [name] is a 48 y.o.African American female who presents for follow-up of Type 2 diabetes mellitus.   She is concerned that her blood glucose values are elevated. She is also complaining of pain and irritation in vaginal area for the last 5 days.  Her BG went up to 400+ on Sunday (567m/dl) Average BG:  301 mg/dl  She denies missed doses of insulin  Her insurance is changing preference from Novolog to Humalog. She is trying to eat healthy. She watches grandchildren for exercise. Denies problems with feet. Some peripheral edema No blurry vision. Nocturia each night.  Continues to take Vesicare.    Review of Systems A comprehensive review of systems was negative except for: Cardiovascular: positive for lower extremity edema Genitourinary: positive for pain and irritation in vaginal area and nocturia    Objective:    BP 140/78 mmHg  Pulse 89  Resp 10  Ht 5' 6"  (1.676 m)  Wt 235 lb (106.595 kg)  BMI 37.95 kg/m2  SpO2 97%  General:  alert, cooperative and mild distress  Oropharynx: normal findings: lips normal without lesions and gums healthy   Eyes:  negative findings: lids and lashes normal and conjunctivae and sclerae normal   Ears:  external ears normal        Lung: clear to auscultation bilaterally  Heart:  regular rate and rhythm     Extremities: edema bilateral lower extremities  Skin: warm and dry, no hyperpigmentation, vitiligo, or suspicious lesions     Neuro: mental status, speech normal, alert and oriented x3 and gait and station normal   Lab Review GLUCOSE (mg/dL)  Date Value  09/07/2013 201*  06/05/2013 291*  05/23/2013 349*   GLUCOSE, BLD (mg/dL)  Date Value  06/16/2013 176*  02/06/2013 197*  02/01/2013 102*   CO2  Date Value  09/07/2013 21 mmol/L  06/16/2013 20 mEq/L  06/05/2013 18 mmol/L   BUN (mg/dL)  Date Value  09/07/2013 15  06/16/2013 16  06/05/2013 15  05/23/2013 15  02/06/2013 15  02/01/2013 14    CREAT (mg/dL)  Date Value  10/07/2012 0.89   CREATININE, SER (mg/dL)  Date Value  09/07/2013 0.82  06/16/2013 0.77  06/05/2013 0.84       Assessment:    Diabetes Mellitus type II, under poor control.    Plan:    1.  Rx changes: 1.  Increase Levemir to 60 units AM, 30 units noon, 60 units PM.  2. Increase Humalog to 20 units with meals (25 units if BG>300) 2.  She is to schedule OV with Dr. PBubba Campor JJanett BillowASAP re:  Vaginal pain. 3.  Counseled on benefit of routine exercise.  Goal is 30-45 minutes 5 x week. 4.  Counseled on importance of hydration with water. 5.  Counseled on nutrition goals.  Needs to stop with regular cranberry juice. 6.  BP at goal <140/90. 7.  RTC in 1 month.  Call next week if BG not improving.

## 2013-11-20 NOTE — Patient Instructions (Signed)
Increase Levemir to 60 units with breakfast, 30 units with lunch, 60 units with supper Increase Novolog / Humalog to 20 units with each meal (give 25 units if blood sugar >300)

## 2013-11-21 ENCOUNTER — Other Ambulatory Visit: Payer: Self-pay | Admitting: Nurse Practitioner

## 2013-11-22 ENCOUNTER — Ambulatory Visit: Payer: PRIVATE HEALTH INSURANCE | Admitting: Internal Medicine

## 2013-11-22 ENCOUNTER — Inpatient Hospital Stay (HOSPITAL_COMMUNITY)
Admission: AD | Admit: 2013-11-22 | Discharge: 2013-11-22 | Disposition: A | Discharge status: Home / Self Care | Payer: Medicare Other | Source: Ambulatory Visit | Attending: Obstetrics & Gynecology | Admitting: Obstetrics & Gynecology

## 2013-11-22 ENCOUNTER — Encounter (HOSPITAL_COMMUNITY): Payer: Self-pay | Admitting: *Deleted

## 2013-11-22 DIAGNOSIS — B373 Candidiasis of vulva and vagina: Secondary | ICD-10-CM | POA: Insufficient documentation

## 2013-11-22 DIAGNOSIS — Z87891 Personal history of nicotine dependence: Secondary | ICD-10-CM | POA: Diagnosis not present

## 2013-11-22 DIAGNOSIS — N949 Unspecified condition associated with female genital organs and menstrual cycle: Secondary | ICD-10-CM | POA: Diagnosis present

## 2013-11-22 DIAGNOSIS — E1165 Type 2 diabetes mellitus with hyperglycemia: Secondary | ICD-10-CM | POA: Diagnosis not present

## 2013-11-22 DIAGNOSIS — H548 Legal blindness, as defined in USA: Secondary | ICD-10-CM | POA: Insufficient documentation

## 2013-11-22 DIAGNOSIS — I10 Essential (primary) hypertension: Secondary | ICD-10-CM | POA: Diagnosis not present

## 2013-11-22 DIAGNOSIS — E1140 Type 2 diabetes mellitus with diabetic neuropathy, unspecified: Secondary | ICD-10-CM | POA: Diagnosis not present

## 2013-11-22 DIAGNOSIS — B3731 Acute candidiasis of vulva and vagina: Secondary | ICD-10-CM

## 2013-11-22 DIAGNOSIS — Z8673 Personal history of transient ischemic attack (TIA), and cerebral infarction without residual deficits: Secondary | ICD-10-CM | POA: Diagnosis not present

## 2013-11-22 HISTORY — DX: Major depressive disorder, single episode, unspecified: F32.9

## 2013-11-22 HISTORY — DX: Anxiety disorder, unspecified: F41.9

## 2013-11-22 HISTORY — DX: Hyperlipidemia, unspecified: E78.5

## 2013-11-22 HISTORY — DX: Depression, unspecified: F32.A

## 2013-11-22 HISTORY — DX: Stress incontinence (female) (male): N39.3

## 2013-11-22 HISTORY — DX: Tubulo-interstitial nephritis, not specified as acute or chronic: N12

## 2013-11-22 LAB — URINALYSIS, ROUTINE W REFLEX MICROSCOPIC
Bilirubin Urine: NEGATIVE
Glucose, UA: >1000 mg/dL — AB
Ketones, ur: 15 mg/dL — AB
Nitrite: NEGATIVE
Protein, ur: NEGATIVE mg/dL
Specific Gravity, Urine: 1.025 (ref 1.005–1.030)
Urobilinogen, UA: 1 mg/dL (ref 0.0–1.0)
pH: 6 (ref 5.0–8.0)

## 2013-11-22 LAB — WET PREP, GENITAL
Clue Cells Wet Prep HPF POC: NONE SEEN
Trich, Wet Prep: NONE SEEN

## 2013-11-22 LAB — URINE MICROSCOPIC-ADD ON

## 2013-11-22 LAB — HIV ANTIBODY (ROUTINE TESTING W REFLEX): HIV 1&2 Ab, 4th Generation: NONREACTIVE

## 2013-11-22 MED ORDER — FLUCONAZOLE 150 MG PO TABS
150.0000 mg | ORAL_TABLET | Freq: Once | ORAL | Status: AC
  Administered 2013-11-22: 150 mg via ORAL
  Filled 2013-11-22: qty 1

## 2013-11-22 MED ORDER — FLUCONAZOLE 150 MG PO TABS: 150.0000 mg | ORAL_TABLET | Freq: Every day | ORAL | Status: DC

## 2013-11-22 MED ORDER — TERCONAZOLE 0.8 % VA CREA: 1.0000 | TOPICAL_CREAM | Freq: Every day | VAGINAL | Status: DC | PRN

## 2013-11-22 MED ORDER — LIDOCAINE HCL 2 % EX GEL
1.0000 "application " | Freq: Once | CUTANEOUS | Status: AC
  Administered 2013-11-22: 1 via TOPICAL
  Filled 2013-11-22: qty 5

## 2013-11-22 NOTE — MAU Note (Signed)
Patient states she has really bad genital irritation X 5 days. States she was going to go to her primary MD this afternoon, but could not stand the pain any longer. States she is diabetic and her sugars have been very high recently. Not sure if she is having vaginal D/C. States she has urinary incontinence and wears a pad for that. States she is unable to void for specimen at this time.

## 2013-11-22 NOTE — MAU Provider Note (Signed)
History     CSN: [id number]  Arrival date and time: 11/22/13 1333   First Provider Initiated Contact with Patient 11/22/13 1410      Chief Complaint  Patient presents with  . Severe discomfort to genitals    HPI   [name] is a 48 y.o.female [id number] who presents with severe vaginal pain/discomfort.  She was scheduled to be seen by her primary care Dr today for this discomfort however could not wait until this afternoon. She denies new sexual partners, her husband recently passed away. She has a history of DM type 2 with very poor control. She has a primary care dr who manages her care. She has burning with urination and while she is just sitting. Her last hgb AIC was 11. Patient lives with her daughter and her daughter is concerned that she is not able to clean her self properly in the vaginal area.   OB History    Gravida Para Term Preterm AB TAB SAB Ectopic Multiple Living   3 2 2  1  1   2       Past Medical History  Diagnosis Date  . Hypertension   . Asthma   . Seizures   . Heart murmur     born with   . Dysrhythmia     skipped beats  . Myocardial infarction     hi of x 2  last one 2003   . COPD (chronic obstructive pulmonary disease)     chronic bronchitis   . Pneumonia     hx of 2009  . GERD (gastroesophageal reflux disease)   . Headache(784.0)     migraines   . Neuropathy     due to diabetes   . Dizziness     secondary to diabetes and hypertension   . Arthritis     joint pain   . Hx of blood clots     hematomas removed from left side of brain from 69mo to 89yrold   . Epilepsy idiopathic petit mal     last seizure 2012;controlled w/ topomax  . Diabetic neuropathy, painful     FEET AND HANDS  . Diabetes mellitus     since age [age]type 2 IDDM  . Sleep apnea     [facility];does not use Cpap ; mild  . MIGRAINE HEADACHE 09/14/2006    Qualifier: Diagnosis of  By: MaHassell DoneNP, NyTori Milks  . Glaucoma     NOT ON ANY EYE DROPS    . Legally blind     left eye   . Stroke     last 2003  RESIDUAL LEFT LEG WEAKNESS--NO OTHER RESIDUAL PROBLEMS  . CVA 11/14/2007    Qualifier: Diagnosis of  By: MaHassell DoneNP, NyTori Milks  . Pain 09/23/11    LOWER BACK AND RIGHT SIDE--PT HAS RIGHT URETERAL STONE  . Nephrolithiasis     frequent urination , urination at nite  PT SEEN IN ER 09/22/11 FOR BACK AND RT SIDED PAIN--HAS KNOWN STONE RT URETER AND UA IN ER SHOWED UTI  . Anxiety   . Depression   . Stress incontinence   . Hyperlipidemia   . Pyelonephritis     Past Surgical History  Procedure Laterality Date  . Kidney stone removal    . Brain hematoma evacutation      five procedures total, first procedure when [age]onths old, last at [age]ears of age.  . Shunt removal  shunt inserted at age 66 removed at age 75   . Eye surgery    . Retinal detachment surgery  1990  . Vaginal hysterectomy  1996  . Incisional hernia repair  05/05/2011    Procedure: LAPAROSCOPIC INCISIONAL HERNIA;  Surgeon: Rolm Bookbinder, MD;  Location: [facility];  Service: General;  Laterality: N/A;  . Mass excision  05/05/2011    Procedure: EXCISION MASS;  Surgeon: Rolm Bookbinder, MD;  Location: [facility];  Service: General;  Laterality: Right;  . Hernia repair  05/05/11    lap incisional hernia  . Pcnl    . Cystoscopy/retrograde/ureteroscopy  08/24/2011    Procedure: CYSTOSCOPY/RETROGRADE/URETEROSCOPY;  Surgeon: Molli Hazard, MD;  Location: [facility];  Service: Urology;  Laterality: Right;  Cysto, Right retrograde Pyelogram, right stent placement.   . Cystoscopy with ureteroscopy  09/24/2011    Procedure: CYSTOSCOPY WITH URETEROSCOPY;  Surgeon: Malka So, MD;  Location: [facility];  Service: Urology;  Laterality: Right;  Balloon dilation right ureter   . Cystoscopy w/ retrogrades  09/24/2011    Procedure: CYSTOSCOPY WITH RETROGRADE PYELOGRAM;  Surgeon: Malka So, MD;  Location: [facility];  Service: Urology;  Laterality: Right;    Family History  Problem Relation  Age of Onset  . Cancer Mother     uterine  . Hypertension Mother   . Cancer Maternal Grandmother     brain  . Hypertension Maternal Grandmother   . Anesthesia problems Neg Hx     History  Substance Use Topics  . Smoking status: Former Smoker -- 1.00 packs/day for 30 years    Types: Cigarettes    Quit date: 01/20/1987  . Smokeless tobacco: Never Used     Comment: quit more than 20years  . Alcohol Use: No    Allergies:  Allergies  Allergen Reactions  . Codeine Swelling and Other (See Comments)    Swelling and burning of mouth (inside)  . Lactose Intolerance (Gi) Nausea And Vomiting    Prescriptions prior to admission  Medication Sig Dispense Refill Last Dose  . albuterol (PROVENTIL HFA;VENTOLIN HFA) 108 (90 BASE) MCG/ACT inhaler Inhale 2 puffs into the lungs every 6 (six) hours as needed for wheezing or shortness of breath. Wheezing 6.7 g 3 Taking  . baclofen (LIORESAL) 10 MG tablet TAKE 1 TABLET BY MOUTH THREE TIMES A DAY AS NEEDED FOR MUSCLE SPASMS 90 each 2 Taking  . budesonide-formoterol (SYMBICORT) 160-4.5 MCG/ACT inhaler Inhale 2 puffs into the lungs 2 (two) times daily. 1 Inhaler 3 Taking  . cetirizine (ZYRTEC) 10 MG tablet Take 10 mg by mouth at bedtime. For allergies   Taking  . clopidogrel (PLAVIX) 75 MG tablet Take 1 tablet (75 mg total) by mouth daily. 90 tablet 1 Taking  . diclofenac (VOLTAREN) 75 MG EC tablet Take 75 mg by mouth 2 (two) times daily.   Taking  . EASY TOUCH PEN NEEDLES 31G X 8 MM MISC USE AS DIRECTED TO INJECT INSULIN DAILY 100 each 5 Taking  . FLUoxetine (PROZAC) 20 MG capsule Take 3 capsules (60 mg total) by mouth daily at 12 noon. 90 capsule 3 Taking  . fluticasone (FLONASE) 50 MCG/ACT nasal spray Place 2 sprays into both nostrils daily.   Taking  . gabapentin (NEURONTIN) 300 MG capsule TAKE 2 CAPSULES BY MOUTH THREE TIMES A DAY 180 capsule 3 Taking  . ibuprofen (ADVIL,MOTRIN) 600 MG tablet TAKE 1 TABLET BY MOUTH EVERY 8 HOURS AS NEEDED 30 tablet  0   . Insulin Detemir (  LEVEMIR FLEXTOUCH) 100 UNIT/ML Pen Inject 60 units with breakfast, 30 units with lunch, 60 units with supper 40 mL 6   . Insulin Lispro, Human, (HUMALOG KWIKPEN) 200 UNIT/ML SOPN Inject 20 Units into the skin 3 (three) times daily with meals. (if blood sugar >300, give 25 units) 12 mL 6   . lisinopril (PRINIVIL,ZESTRIL) 20 MG tablet Take 1 tablet (20 mg total) by mouth daily. 90 tablet 3 Taking  . ONE TOUCH ULTRA TEST test strip USE TO CHECK BLOOD SUGAR FOUR TIMES A DAY 100 each 2 Taking  . ONETOUCH DELICA LANCETS 94W MISC TEST BLOOD SUGAR FOUR TIMES A DAY 200 each 5 Taking  . oxyCODONE-acetaminophen (PERCOCET/ROXICET) 5-325 MG per tablet Take 1 tablet by mouth every 8 (eight) hours as needed for severe pain. Prescribed by pain doctor   Taking  . pantoprazole (PROTONIX) 40 MG tablet Take 1 tablet (40 mg total) by mouth daily. 30 tablet 3 Taking  . promethazine (PHENERGAN) 25 MG tablet TAKE 1 TABLET BY MOUTH EVERY 6 HOURS AS NEEDED 120 tablet 4 Taking  . ranitidine (ZANTAC) 150 MG tablet TAKE 1 TABLET BY MOUTH TWICE DAILY 60 tablet 2 Taking  . simvastatin (ZOCOR) 20 MG tablet Take 1 tablet (20 mg total) by mouth daily. TAKES IN AM, for cholesterol 30 tablet 3 Taking  . solifenacin (VESICARE) 10 MG tablet Take 1 tablet (10 mg total) by mouth daily. 30 tablet 6 Taking  . Tdap (BOOSTRIX) 5-2.5-18.5 LF-MCG/0.5 injection Inject 0.5 mLs into the muscle once. 0.5 mL 0 Taking  . topiramate (TOPAMAX) 200 MG tablet TAKE 1 TABLET BY MOUTH TWICE DAILY 60 tablet 3 Taking  . traZODone (DESYREL) 150 MG tablet Take 1 tablet (150 mg total) by mouth at bedtime. For Sleep 30 tablet 5 Taking  . verapamil (VERELAN PM) 240 MG 24 hr capsule TAKE 1 CAPSULE BY MOUTH EVERY NIGHT AT BEDTIME FOR BLOOD PRESSURE 30 capsule 3 Taking  . Vitamin D, Ergocalciferol, (DRISDOL) 50000 UNITS CAPS capsule Take 50,000 Units by mouth every Thursday.   Taking  . zolpidem (AMBIEN) 5 MG tablet Take 1 tablet (5 mg total)  by mouth at bedtime as needed for sleep. 30 tablet 5 Taking   Results for orders placed or performed during the hospital encounter of 11/22/13 (from the past 48 hour(s))  Wet prep, genital     Status: Abnormal   Collection Time: 11/22/13  2:22 PM  Result Value Ref Range   Yeast Wet Prep HPF POC MODERATE (A) NONE SEEN   Trich, Wet Prep NONE SEEN NONE SEEN   Clue Cells Wet Prep HPF POC NONE SEEN NONE SEEN   WBC, Wet Prep HPF POC MANY (A) NONE SEEN    Comment: MANY BACTERIA SEEN  Urinalysis, Routine w reflex microscopic     Status: Abnormal   Collection Time: 11/22/13  3:30 PM  Result Value Ref Range   Color, Urine YELLOW YELLOW   APPearance HAZY (A) CLEAR   Specific Gravity, Urine 1.025 1.005 - 1.030   pH 6.0 5.0 - 8.0   Glucose, UA >1000 (A) NEGATIVE mg/dL   Hgb urine dipstick SMALL (A) NEGATIVE   Bilirubin Urine NEGATIVE NEGATIVE   Ketones, ur 15 (A) NEGATIVE mg/dL   Protein, ur NEGATIVE NEGATIVE mg/dL   Urobilinogen, UA 1.0 0.0 - 1.0 mg/dL   Nitrite NEGATIVE NEGATIVE   Leukocytes, UA SMALL (A) NEGATIVE  Urine microscopic-add on     Status: Abnormal   Collection Time: 11/22/13  3:30 PM  Result Value Ref Range   Squamous Epithelial / LPF FEW (A) RARE   WBC, UA 21-50 <3 WBC/hpf   RBC / HPF 0-2 <3 RBC/hpf   Urine-Other FEW YEAST      Review of Systems  Constitutional: Negative for fever, chills and malaise/fatigue.  Gastrointestinal: Positive for nausea. Negative for abdominal pain.  Genitourinary: Positive for dysuria, urgency and frequency.       Thick discharge, burning all over vagina   Neurological: Negative for dizziness and weakness.   Physical Exam   Blood pressure 155/95, pulse 92, temperature 98.1 F (36.7 C), temperature source Oral, resp. rate 16, height 5' 5.5" (1.664 m), weight 106.595 kg (235 lb).  Physical Exam  Constitutional: She is oriented to person, place, and time. She appears well-developed and well-nourished.  Obese   HENT:  Head:  Normocephalic.  Eyes: Pupils are equal, round, and reactive to light.  Neck: Neck supple.  Respiratory: Effort normal.  Genitourinary:  Speculum exam: Unable to perform speculum exam due to patients discomfort. Multiple, large ulcers covering vulva, tender to touch. Large amount of thick, white discharge covering vulva and introitus.  Bimanual exam: Deferred due to patients discomfort GC/Chlam, wet prep done Chaperone present for exam.   Musculoskeletal: Normal range of motion.  Neurological: She is alert and oriented to person, place, and time.  Psychiatric: Her speech is delayed. She is slowed.    MAU Course  Procedures  None  MDM Wet prep: yeast  GC Diflucan 150 mg PO  HIV HSV culture pending   Assessment and Plan   A: Yeast vaginitis secondary to poorly controlled DM   P: Discharge home in stable condition  RX: Diflucan         Terazol cream for comfort.  Follow up with PCP as scheduled Discussed the importance of managing DM   [place], NP 11/22/2013 7:20 PM

## 2013-11-22 NOTE — Discharge Instructions (Signed)
Candidal Vulvovaginitis Candidal vulvovaginitis is an infection of the vagina and vulva. The vulva is the skin around the opening of the vagina. This may cause itching and discomfort in and around the vagina.  HOME CARE  Only take medicine as told by your doctor.  Do not have sex (intercourse) until the infection is healed or as told by your doctor.  Practice safe sex.  Tell your sex partner about your infection.  Do not douche or use tampons.  Wear cotton underwear. Do not wear tight pants or panty hose.  Eat yogurt. This may help treat and prevent yeast infections. GET HELP RIGHT AWAY IF:   You have a fever.  Your problems get worse during treatment or do not get better in 3 days.  You have discomfort, irritation, or itching in your vagina or vulva area.  You have pain after sex.  You start to get belly (abdominal) pain. MAKE SURE YOU:  Understand these instructions.  Will watch your condition.  Will get help right away if you are not doing well or get worse. Document Released: 04/03/2008 Document Revised: 01/10/2013 Document Reviewed: 04/03/2008 [facility] Patient Information 2015 [place], [place]. This information is not intended to replace advice given to you by your health care provider. Make sure you discuss any questions you have with your health care provider.

## 2013-11-23 LAB — GC/CHLAMYDIA PROBE AMP
CT Probe RNA: NEGATIVE
GC Probe RNA: NEGATIVE

## 2013-11-24 ENCOUNTER — Other Ambulatory Visit: Payer: Self-pay | Admitting: *Deleted

## 2013-11-24 DIAGNOSIS — B3731 Acute candidiasis of vulva and vagina: Secondary | ICD-10-CM

## 2013-11-24 DIAGNOSIS — B373 Candidiasis of vulva and vagina: Secondary | ICD-10-CM

## 2013-11-24 LAB — HERPES SIMPLEX VIRUS CULTURE
Culture: DETECTED
Special Requests: NORMAL

## 2013-11-24 NOTE — Telephone Encounter (Signed)
Kynlea left a message that she was seen in the ER and they gave her a vaginal cream terzonazole and told me to get an appointment- none until December.  I am out of the cream because I have a really bad yeast infection and I need a refill sent to my pharmacy- [address].  Refill approved by DR. Stinson. Called [facility], unable to leave a message. Sent refill to pharmacy

## 2013-11-25 ENCOUNTER — Telehealth: Payer: Self-pay | Admitting: Advanced Practice Midwife

## 2013-11-25 MED ORDER — VALACYCLOVIR HCL 1 G PO TABS: 1000.0000 mg | ORAL_TABLET | Freq: Every day | ORAL | Status: DC

## 2013-11-25 MED ORDER — LIDOCAINE HCL 2 % EX GEL: 1.0000 "application " | CUTANEOUS | Status: DC | PRN

## 2013-11-25 NOTE — Telephone Encounter (Signed)
Called pt to inform her of positive HSV results from 11/22/13.  Pt stated understanding and given opportunity to ask questions.  Valtrex 1000 mg daily x 10 days for current outbreak, Valtrex 1000 mg x 5 days with 2 refills for any future recurrences, and lidocaine jelly for topical use sent to pt pharmacy.

## 2013-11-27 ENCOUNTER — Ambulatory Visit: Payer: PRIVATE HEALTH INSURANCE | Admitting: Pharmacotherapy

## 2013-11-27 NOTE — Telephone Encounter (Signed)
Called [name] and she states she did pick up the vaginal cream, no other concerns voiced.

## 2013-12-01 ENCOUNTER — Other Ambulatory Visit: Payer: Self-pay | Admitting: Internal Medicine

## 2013-12-01 ENCOUNTER — Other Ambulatory Visit: Payer: Self-pay | Admitting: Nurse Practitioner

## 2013-12-20 ENCOUNTER — Ambulatory Visit (HOSPITAL_COMMUNITY): Payer: Self-pay | Admitting: Psychiatry

## 2013-12-25 ENCOUNTER — Ambulatory Visit (INDEPENDENT_AMBULATORY_CARE_PROVIDER_SITE_OTHER): Payer: PRIVATE HEALTH INSURANCE | Admitting: Pharmacotherapy

## 2013-12-25 VITALS — BP 130/70 | HR 93 | Temp 98.1°F | Resp 20 | Ht 65.5 in | Wt 236.0 lb

## 2013-12-25 DIAGNOSIS — I10 Essential (primary) hypertension: Secondary | ICD-10-CM

## 2013-12-25 DIAGNOSIS — E1165 Type 2 diabetes mellitus with hyperglycemia: Secondary | ICD-10-CM

## 2013-12-25 DIAGNOSIS — E1129 Type 2 diabetes mellitus with other diabetic kidney complication: Secondary | ICD-10-CM

## 2013-12-25 DIAGNOSIS — IMO0002 Reserved for concepts with insufficient information to code with codable children: Secondary | ICD-10-CM

## 2013-12-25 MED ORDER — INSULIN DETEMIR 100 UNIT/ML FLEXPEN: PEN_INJECTOR | SUBCUTANEOUS | Status: DC

## 2013-12-25 MED ORDER — INSULIN LISPRO 200 UNIT/ML ~~LOC~~ SOPN: 30.0000 [IU] | PEN_INJECTOR | Freq: Three times a day (TID) | SUBCUTANEOUS | Status: DC

## 2013-12-25 NOTE — Progress Notes (Signed)
  Subjective:    [name] is a 48 y.o.African American female who presents for follow-up of Type 2 diabetes mellitus.  Her BG this morning was 268m/dl She complains of "not feeling well" Her lowest BG:  1463mdl Average BG:  20841ml (only SMBG 4 times in 14 days, only once in the last week) She is sleeping a lot. Denies missing doses of insulin.  Her current Levemir dose is 60 units AM, 30 units lunch, 60 units supper. Her current Humalog dose is 20 units three times per day.  She does have peripheral edema. Her skin is dry She continues to go to the pain doctor. Some blurry vision. Pain in her feet  Nocturia several times per night.  Her daughter cooks.  Not frying foods.  Non-starchy veggies, making good starch portion control. Walking with grandson a few times per week.   Review of Systems A comprehensive review of systems was negative except for: Constitutional: positive for malaise Eyes: positive for redness Cardiovascular: positive for lower extremity edema Genitourinary: positive for dysuria and nocturia Endocrine: positive for diabetic symptoms including blurry vision, increased fatigue and skin dryness    Objective:    BP 130/70 mmHg  Pulse 93  Temp(Src) 98.1 F (36.7 C) (Oral)  Resp 20  Ht 5' 5.5" (1.664 m)  Wt 236 lb (107.049 kg)  BMI 38.66 kg/m2  SpO2 95%  General:  alert, cooperative, appears older than stated age and no distress  Oropharynx: normal findings: lips normal without lesions and gums healthy   Eyes:  positive findings: sclera pink   Ears:  external ears normal        Lung: clear to auscultation bilaterally  Heart:  regular rate and rhythm     Extremities: edema bilateral lower extremities  Skin: dry     Neuro: mental status, speech normal, alert and oriented x3 and gait and station normal   Lab Review GLUCOSE (mg/dL)  Date Value  09/07/2013 201*  06/05/2013 291*  05/23/2013 349*   GLUCOSE, BLD (mg/dL)  Date Value   06/16/2013 176*  02/06/2013 197*  02/01/2013 102*   CO2  Date Value  09/07/2013 21 mmol/L  06/16/2013 20 mEq/L  06/05/2013 18 mmol/L   BUN (mg/dL)  Date Value  09/07/2013 15  06/16/2013 16  06/05/2013 15  05/23/2013 15  02/06/2013 15  02/01/2013 14   CREAT (mg/dL)  Date Value  10/07/2012 0.89   CREATININE, SER (mg/dL)  Date Value  09/07/2013 0.82  06/16/2013 0.77  06/05/2013 0.84   A1C, CMP, FLP today    Assessment:    Diabetes Mellitus type II, under poor control.    Plan:    1.  Rx changes: 1.  increase Levemir 60 units breakfast, 50 units lunch, 60 units supper.  2.  Increase Humalog 30 units with each meal.  3.  Must SMBG at least 3 times daily. 2.  Counseled on importance of improved self-care habits. 3.  Explained that her chronic fatigue and recurrent UTI a symptom of uncontrolled DM. 4.  Counseled on nutrition goals and snack choices. 5.  Exercise goal is 30-45 minutes 5 x week. 6.  BP at goal <140/90 7.  RTC in 6 weeks

## 2013-12-25 NOTE — Patient Instructions (Signed)
Increase Levemir 60 units breakfast, 50 units lunch, 60 units supper Increase Humalog 30 units with each meal.  Need to check blood sugar 3 times every day

## 2013-12-26 ENCOUNTER — Ambulatory Visit: Payer: PRIVATE HEALTH INSURANCE | Admitting: Internal Medicine

## 2013-12-26 LAB — COMPREHENSIVE METABOLIC PANEL
ALT: 12 IU/L (ref 0–32)
AST: 8 IU/L (ref 0–40)
Albumin/Globulin Ratio: 1.4 (ref 1.1–2.5)
Albumin: 4 g/dL (ref 3.5–5.5)
Alkaline Phosphatase: 95 IU/L (ref 39–117)
BUN/Creatinine Ratio: 17 (ref 9–23)
BUN: 15 mg/dL (ref 6–24)
CO2: 20 mmol/L (ref 18–29)
Calcium: 9.2 mg/dL (ref 8.7–10.2)
Chloride: 101 mmol/L (ref 97–108)
Creatinine, Ser: 0.86 mg/dL (ref 0.57–1.00)
GFR calc Af Amer: 92 mL/min/{1.73_m2} (ref >59)
GFR calc non Af Amer: 80 mL/min/{1.73_m2} (ref >59)
Globulin, Total: 2.9 g/dL (ref 1.5–4.5)
Glucose: 273 mg/dL — ABNORMAL HIGH (ref 65–99)
Potassium: 4.5 mmol/L (ref 3.5–5.2)
Sodium: 136 mmol/L (ref 134–144)
Total Bilirubin: <0.2 mg/dL (ref 0.0–1.2)
Total Protein: 6.9 g/dL (ref 6.0–8.5)

## 2013-12-26 LAB — LIPID PANEL
Chol/HDL Ratio: 4.2 ratio units (ref 0.0–4.4)
Cholesterol, Total: 161 mg/dL (ref 100–199)
HDL: 38 mg/dL — ABNORMAL LOW (ref >39)
LDL Calculated: 97 mg/dL (ref 0–99)
Triglycerides: 131 mg/dL (ref 0–149)
VLDL Cholesterol Cal: 26 mg/dL (ref 5–40)

## 2013-12-26 LAB — HEMOGLOBIN A1C
Est. average glucose Bld gHb Est-mCnc: 263 mg/dL
Hgb A1c MFr Bld: 10.8 % — ABNORMAL HIGH (ref 4.8–5.6)

## 2013-12-29 ENCOUNTER — Ambulatory Visit (INDEPENDENT_AMBULATORY_CARE_PROVIDER_SITE_OTHER): Payer: Medicare Other | Admitting: Psychiatry

## 2013-12-29 ENCOUNTER — Telehealth: Payer: Self-pay

## 2013-12-29 VITALS — BP 132/98 | HR 88 | Ht 66.0 in | Wt 233.8 lb

## 2013-12-29 DIAGNOSIS — F331 Major depressive disorder, recurrent, moderate: Secondary | ICD-10-CM

## 2013-12-29 DIAGNOSIS — F32A Depression, unspecified: Secondary | ICD-10-CM

## 2013-12-29 DIAGNOSIS — F332 Major depressive disorder, recurrent severe without psychotic features: Secondary | ICD-10-CM

## 2013-12-29 DIAGNOSIS — F419 Anxiety disorder, unspecified: Principal | ICD-10-CM

## 2013-12-29 DIAGNOSIS — F329 Major depressive disorder, single episode, unspecified: Secondary | ICD-10-CM

## 2013-12-29 DIAGNOSIS — G4700 Insomnia, unspecified: Secondary | ICD-10-CM

## 2013-12-29 MED ORDER — ZOLPIDEM TARTRATE 10 MG PO TABS: 10.0000 mg | ORAL_TABLET | Freq: Every evening | ORAL | Status: DC | PRN

## 2013-12-29 MED ORDER — FLUOXETINE HCL 20 MG PO CAPS: 60.0000 mg | ORAL_CAPSULE | Freq: Every day | ORAL | Status: DC

## 2013-12-29 MED ORDER — TRAZODONE HCL 150 MG PO TABS: 150.0000 mg | ORAL_TABLET | Freq: Every day | ORAL | Status: DC

## 2013-12-29 NOTE — Progress Notes (Signed)
[facility] MD Progress Note  12/29/2013 11:07 AM [name]  MRN:  [id number] Subjective:  Still sad The patient is grieving appropriately. She still dealing with the sudden death of her husband. It should be noted the patient is on disability and she's been on it for decades. She therefore does not have to deal with any work setting. She has a number of supportive children. She has 2 grown children a step child and 3 other children that come from her husband. During Thanksgiving they all got together for very supportive for her. She said she had a wonderful Thanksgiving. The patient's big problem is also related to sleep. She's having difficulty getting off to sleep or staying asleep. She takes naps all the time. She is eating fairly well intact eating too well. Her primary care doctors recently increased her insulin. The patient is not suicidal. The patient is not psychotic. Patient is able to think and concentrate fairly well. She is functioning. Initially she had persistent daily depression but now it is no longer persisted. She clearly can see that things are getting better. Today the patient is open to the idea of calling hospice and at stress which she'll do. Overall the patient is showing slow but steady improvement. Diagnosis:   DSM5: Schizophrenia Disorders:   Obsessive-Compulsive Disorders:   Trauma-Stressor Disorders:   Substance/Addictive Disorders:   Depressive Disorders:  Major Depressive Disorder - Moderate (296.22) Total Time spent with patient: 30 minutes  Axis I: Major Depression, Recurrent severe  ADL's:  Intact  Sleep: Good  Appetite:  Good  Suicidal Ideation:  no Homicidal Ideation:  none AEB (as evidenced by):  Psychiatric Specialty Exam: Physical Exam  ROS  Blood pressure 132/98, pulse 88, height 5' 6"  (1.676 m), weight 233 lb 12.8 oz (106.051 kg).Body mass index is 37.75 kg/(m^2).  General Appearance: Casual  Eye Contact::  Good  Speech:  Clear and Coherent   Volume:  Normal  Mood:  NA  Affect:  NA  Thought Process:  Coherent  Orientation:  Full (Time, Place, and Person)  Thought Content:  WDL  Suicidal Thoughts:  No  Homicidal Thoughts:  No  Memory:  NA  Judgement:  Good  Insight:  Good  Psychomotor Activity:  Normal  Concentration:  Good  Recall:  Good  Fund of Knowledge:Good  Language: Good  Akathisia:  No  Handed:  Right  AIMS (if indicated):     Assets:  Communication Skills  Sleep:      Musculoskeletal: Strength & Muscle Tone:  Gait & Station:  Patient leans:   Current Medications: Current Outpatient Prescriptions  Medication Sig Dispense Refill  . albuterol (PROVENTIL HFA;VENTOLIN HFA) 108 (90 BASE) MCG/ACT inhaler Inhale 2 puffs into the lungs every 6 (six) hours as needed for wheezing or shortness of breath. Wheezing 6.7 g 3  . baclofen (LIORESAL) 10 MG tablet TAKE 1 TABLET BY MOUTH THREE TIMES A DAY AS NEEDED FOR MUSCLE SPASMS 90 tablet 1  . budesonide-formoterol (SYMBICORT) 160-4.5 MCG/ACT inhaler Inhale 2 puffs into the lungs 2 (two) times daily. 1 Inhaler 3  . cetirizine (ZYRTEC) 10 MG tablet Take 10 mg by mouth at bedtime. For allergies    . clopidogrel (PLAVIX) 75 MG tablet Take 1 tablet (75 mg total) by mouth daily. 90 tablet 1  . diclofenac (VOLTAREN) 75 MG EC tablet Take 75 mg by mouth 2 (two) times daily.    [name] EASY TOUCH PEN NEEDLES 31G X 8 MM MISC USE AS DIRECTED TO  INJECT INSULIN DAILY 100 each 5  . fluconazole (DIFLUCAN) 150 MG tablet Take 1 tablet (150 mg total) by mouth daily. 2 tablet 0  . FLUoxetine (PROZAC) 20 MG capsule Take 3 capsules (60 mg total) by mouth daily at 12 noon. 90 capsule 6  . fluticasone (FLONASE) 50 MCG/ACT nasal spray Place 2 sprays into both nostrils daily.    [name] gabapentin (NEURONTIN) 300 MG capsule TAKE 2 CAPSULES BY MOUTH THREE TIMES A DAY 180 capsule 3  . ibuprofen (ADVIL,MOTRIN) 600 MG tablet TAKE 1 TABLET BY MOUTH EVERY 8 HOURS AS NEEDED 30 tablet 0  . Insulin Detemir (LEVEMIR  FLEXTOUCH) 100 UNIT/ML Pen Inject 60 units with breakfast, 50 units with lunch, 60 units with supper 45 mL 6  . Insulin Lispro, Human, (HUMALOG KWIKPEN) 200 UNIT/ML SOPN Inject 30 Units into the skin 3 (three) times daily with meals. (if blood sugar >300, give 35 units) 30 mL 6  . lidocaine (XYLOCAINE JELLY) 2 % jelly Apply 1 application topically as needed. 30 mL 0  . lisinopril (PRINIVIL,ZESTRIL) 20 MG tablet Take 1 tablet (20 mg total) by mouth daily. 90 tablet 3  . ONE TOUCH ULTRA TEST test strip USE TO CHECK BLOOD SUGAR FOUR TIMES A DAY 100 each 1  . ONETOUCH DELICA LANCETS 58I MISC TEST BLOOD SUGAR FOUR TIMES A DAY 200 each 5  . oxyCODONE-acetaminophen (PERCOCET/ROXICET) 5-325 MG per tablet Take 1 tablet by mouth every 8 (eight) hours as needed for severe pain. Prescribed by pain doctor    . pantoprazole (PROTONIX) 40 MG tablet TAKE 1 TABLET BY MOUTH DAILY 30 tablet 5  . promethazine (PHENERGAN) 25 MG tablet TAKE 1 TABLET BY MOUTH EVERY 6 HOURS AS NEEDED 120 tablet 4  . ranitidine (ZANTAC) 150 MG tablet TAKE 1 TABLET BY MOUTH TWICE DAILY 60 tablet 5  . simvastatin (ZOCOR) 20 MG tablet Take 1 tablet (20 mg total) by mouth daily. TAKES IN AM, for cholesterol 30 tablet 3  . solifenacin (VESICARE) 10 MG tablet Take 1 tablet (10 mg total) by mouth daily. 30 tablet 6  . Tdap (BOOSTRIX) 5-2.5-18.5 LF-MCG/0.5 injection Inject 0.5 mLs into the muscle once. 0.5 mL 0  . terconazole (TERAZOL 3) 0.8 % vaginal cream Place 1 applicator vaginally daily as needed. OK to use on external genitalia 20 g 0  . topiramate (TOPAMAX) 200 MG tablet TAKE 1 TABLET BY MOUTH TWICE DAILY 60 tablet 3  . traZODone (DESYREL) 150 MG tablet Take 1 tablet (150 mg total) by mouth at bedtime. 2 qhs 60 tablet 5  . valACYclovir (VALTREX) 1000 MG tablet Take 1 tablet (1,000 mg total) by mouth daily. 10 tablet 0  . valACYclovir (VALTREX) 1000 MG tablet Take 1 tablet (1,000 mg total) by mouth daily. 5 tablet 2  . verapamil (VERELAN PM)  240 MG 24 hr capsule TAKE 1 CAPSULE BY MOUTH EVERY NIGHT AT BEDTIME FOR BLOOD PRESSURE 30 capsule 3  . Vitamin D, Ergocalciferol, (DRISDOL) 50000 UNITS CAPS capsule Take 50,000 Units by mouth every Thursday.    . zolpidem (AMBIEN) 10 MG tablet Take 1 tablet (10 mg total) by mouth at bedtime as needed for sleep. 30 tablet 5   No current facility-administered medications for this visit.    Lab Results: No results found for this or any previous visit (from the past 48 hour(s)).  Physical Findings: AIMS:  , ,  ,  ,    CIWA:    COWS:     Treatment Plan Summary:  Today the patient will continue taking Prozac 60 mg. We will go ahead and adjust up her sleeping aids. Will increase her Ambien from 5 mg to 10 mg and will increase her trazodone from 150 mg 1 a day to taking 2 a day. The patient was given the telephone number to hospice of [facility] will make it efforts to talk to them. This patient she'll return to see me in 3 months.  Plan:  Medical Decision Making Problem Points:  Established problem, stable/improving (1) Data Points:  Review of medication regiment & side effects (2)  I certify that inpatient services furnished can reasonably be expected to improve the patient's condition.   Joannie Medine, [place] 12/29/2013, 11:07 AM

## 2013-12-29 NOTE — Telephone Encounter (Signed)
Patient called at 4:47 left message on Medical Assistant voice mail, has a bad cough, chest , back, head hurts has taken Coricidin all week not helping. Patient has hypertension and diabetic.  Per Dr. Eulas Post she can take Robitussin (for diabetic) and plain Mucinex. Spoke with the patient, she will have her daughter go get some.

## 2014-01-01 ENCOUNTER — Other Ambulatory Visit: Payer: Self-pay | Admitting: Nurse Practitioner

## 2014-01-01 NOTE — Telephone Encounter (Signed)
[place]

## 2014-01-09 ENCOUNTER — Ambulatory Visit: Payer: Medicare Other | Attending: Anesthesiology | Admitting: Physical Therapy

## 2014-01-09 DIAGNOSIS — M545 Low back pain: Secondary | ICD-10-CM | POA: Insufficient documentation

## 2014-01-09 DIAGNOSIS — M62830 Muscle spasm of back: Secondary | ICD-10-CM | POA: Insufficient documentation

## 2014-01-09 NOTE — Therapy (Signed)
[place], [place], [address] Phone: [phone]   Fax:  [phone]  Physical Therapy Evaluation  Patient Details  Name: [name] MRN: [id number] Date of Birth: [birth year]  Encounter Date: 01/09/2014      PT End of Session - 01/09/14 1105    Visit Number 1   Number of Visits 16   Date for PT Re-Evaluation 03/06/14   PT Start Time 4765   PT Stop Time 1108   PT Time Calculation (min) 53 min   Activity Tolerance Patient limited by pain      Past Medical History  Diagnosis Date  . Hypertension   . Asthma   . Seizures   . Heart murmur     born with   . Dysrhythmia     skipped beats  . Myocardial infarction     hi of x 2  last one 2003   . COPD (chronic obstructive pulmonary disease)     chronic bronchitis   . Pneumonia     hx of 2009  . GERD (gastroesophageal reflux disease)   . Headache(784.0)     migraines   . Neuropathy     due to diabetes   . Dizziness     secondary to diabetes and hypertension   . Arthritis     joint pain   . Hx of blood clots     hematomas removed from left side of brain from 40mo to 89yrold   . Epilepsy idiopathic petit mal     last seizure 2012;controlled w/ topomax  . Diabetic neuropathy, painful     FEET AND HANDS  . Diabetes mellitus     since age [age]type 2 IDDM  . Sleep apnea     sleep study 2010 @ [facility];does not use Cpap ; mild  . MIGRAINE HEADACHE 09/14/2006    Qualifier: Diagnosis of  By: MaHassell DoneNP, NyTori Milks  . Glaucoma     NOT ON ANY EYE DROPS   . Legally blind     left eye   . Stroke     last 2003  RESIDUAL LEFT LEG WEAKNESS--NO OTHER RESIDUAL PROBLEMS  . CVA 11/14/2007    Qualifier: Diagnosis of  By: MaHassell DoneNP, NyTori Milks  . Pain 09/23/11    LOWER BACK AND RIGHT SIDE--PT HAS RIGHT URETERAL STONE  . Nephrolithiasis     frequent urination , urination at nite  PT SEEN IN ER 09/22/11 FOR BACK AND RT SIDED PAIN--HAS KNOWN STONE RT  URETER AND UA IN ER SHOWED UTI  . Anxiety   . Depression   . Stress incontinence   . Hyperlipidemia   . Pyelonephritis     Past Surgical History  Procedure Laterality Date  . Kidney stone removal    . Brain hematoma evacuation      five procedures total, first procedure when [age]onths old, last at [age]ears of age.  . Shunt removal      shunt inserted at age [age]emoved at age 48 . Eye surgery    . Retinal detachment surgery  1990  . Vaginal hysterectomy  1996  . Incisional hernia repair  05/05/2011    Procedure: LAPAROSCOPIC INCISIONAL HERNIA;  Surgeon: MaRolm BookbinderMD;  [facility]: General;  Laterality: N/A;  . Mass excision  05/05/2011    Procedure: EXCISION MASS;  Surgeon: MaRolm BookbinderMD;  [facility] Service: General;  Laterality: Right;  . Hernia repair  05/05/11    lap incisional hernia  . Pcnl    . Cystoscopy/retrograde/ureteroscopy  08/24/2011    Procedure: CYSTOSCOPY/RETROGRADE/URETEROSCOPY;  Surgeon: Molli Hazard, MD;  Location: [facility];  Service: Urology;  Laterality: Right;  Cysto, Right retrograde Pyelogram, right stent placement.   . Cystoscopy with ureteroscopy  09/24/2011    Procedure: CYSTOSCOPY WITH URETEROSCOPY;  Surgeon: Malka So, MD;  Location: [facility];  Service: Urology;  Laterality: Right;  Balloon dilation right ureter   . Cystoscopy w/ retrogrades  09/24/2011    Procedure: CYSTOSCOPY WITH RETROGRADE PYELOGRAM;  Surgeon: Malka So, MD;  Location: [facility];  Service: Urology;  Laterality: Right;    There were no vitals taken for this visit.  Visit Diagnosis:  Bilateral low back pain, with sciatica presence unspecified - Plan: PT plan of care cert/re-cert  Muscle spasm of back - Plan: PT plan of care cert/re-cert      Subjective Assessment - 01/09/14 1020    Symptoms Had first stroke in 2003 and last one in 2013 and back spasms started following hernia surgery in 2012.  Stayed over a month in the hospital because of  complications. States a combination of her health problems have contributed to this chronic pain problem.  The patient states that the reason she is coming for therapy now is that the pain doctor said before she would refill my pain meds I had to go to PT and a substance abuse counselor.     Pertinent History seizures; cardiac history   How long can you sit comfortably? 15-20   How long can you stand comfortably? 10   How long can you walk comfortably? 50-75 feet   Diagnostic tests no recent studies   Currently in Pain? Yes   Pain Score 8    Pain Location Back   Pain Orientation Left;Right   Pain Descriptors / Indicators Spasm   Pain Type Chronic pain   Pain Onset More than a month ago   Pain Frequency Constant   Aggravating Factors  bending; most chairs   Pain Relieving Factors lying with a small round pillow behind back; body pillow sidelying; sitting in living room furniture          [facility] PT Assessment - 01/09/14 1039    Assessment   Medical Diagnosis --  M62.40 muscle spasms; Low back pain with radiating pain LEs   Onset Date --  > 1 year   Next MD Visit --  No follow up   Prior Therapy --  Only after strokes   Precautions   Precautions --  No lift over 5# (heart);  no climbing steps   Precaution Comments --  legally blind   Restrictions   Weight Bearing Restrictions No   Balance Screen   Has the patient fallen in the past 6 months Yes  Left leg give-way   How many times? 3   Has the patient had a decrease in activity level because of a fear of falling?  Yes   Is the patient reluctant to leave their home because of a fear of falling?  Yes   [place] Private residence   Living Arrangements Children  Downstairs bedroom and bath   Type of [place] to enter   Entrance Stairs-Number of Steps 3   Entrance Stairs-Rails Can reach both   Prior Function   Level of Independence Needs assistance with  ADLs   Toileting  Supervision/set-up   Dressing Supervision/set-up   Grooming Minimal   Vocation On disability  since 48 years old;legally blind   Leisure --  Unable to go to church anymore   Observation/Other Assessments   Observations --  reduced lumbar lordosis   Oswestry Disability Index  --  completed orally secondary to visual impair; 82% "bedbound"   AROM   Right Hip Flexion 90   Left Hip Flexion 60   Lumbar Flexion 15   Lumbar Extension 5   Lumbar - Right Side Bend 10   Lumbar - Left Side Bend 10   PROM   Left Hip Flexion --  SLR 60 left, 45 on right   Strength   Right Hip ABduction 3/5   Left Hip ABduction 2+/5   Lumbar Flexion 2/5   Lumbar Extension 2/5   Special Tests    Special Tests Lumbar   Straight Leg Raise   Findings Positive   Side  Right                  [facility] Adult PT Treatment/Exercise - 01/09/14 1725    Moist Heat Therapy   Number Minutes Moist Heat 10 Minutes   Moist Heat Location --  Lumbar; seated                  PT Short Term Goals - 01/09/14 1755    PT SHORT TERM GOAL #1   Title "Independent with initial HEP   Time 4   Period Weeks   Status New   PT SHORT TERM GOAL #2   Title Patient will report a reduction in overall pain by 15% using modalities, exercise.   Time 4   Period Weeks   Status New   PT SHORT TERM GOAL #3   Title Patient will ambulate safely with least restrictive assistive device 150 feet needed for short distance community ambulation.   Time 4   Period Weeks   Status New           PT Long Term Goals - 01/09/14 1757    PT LONG TERM GOAL #1   Title "Pt will be independent with advanced HEP.    Time 8   Period Weeks   Status New   PT LONG TERM GOAL #2   Title Patient will report overall pain improved by 30% using assistive device, home/clinic modalities, postural correction, positioning techniques.   Time 8   Period Weeks   Status New   PT LONG TERM GOAL #3   Title Modified Oswestry score (assess  orally) improved from 82% to 70% indicating improved function with less pain.   Time 8   Period Weeks   Status New   PT LONG TERM GOAL #4   Title Patient will be able to ambulate safely with least restrictive assistive device 200 feet needed for short distance community ambulation to her doctor's appointments.   Time 8   Period Weeks   Status New               Plan - 01/09/14 1737    Clinical Impression Statement The patient has a history of back pain since 2012 caused by/exacerbated by her multiple co-morbidities include 2 CVAs and hernia surgery.  Patient has many other health issues including grand mal seizures,multiple brain surgeries as a child for "blood clots", cardiac history, severe visual impairment (legally blind) and diabetes with peripheral neuropathy.  She states she is here today because her  pain management doctor would not refill her pain medicine unless she had PT  and underwent substance abuse counseling.  The patient lives with her daughter and grandchildren and she has a CNA who assist her with many ADLs.  She has a history of falls secondary to left LE give-way and would benefit from  an assistive device.  She is currently using none.  Will do BERG at next visit to determine a RW (standard walker would not be suitable secondary to patient's respiratory and cardiac problems) vs. a QC.  Patient has limited left UE and left LE motor control consistent with residual hemiparesis although patient states it is her diabetic neuropathy.  Poor lumbar AROM secondary to pain severity.  No directional preference identified.  Difficulty activating transverse abdominals.  Weakness in bilateral gluteals and trunk extensors.  Positive right SLR however left quite limited as well.  Modified Oswestry score is 82% which is considered "bed bound status".  She is limited to very ambulation short distances only 75-100 feet.      Pt will benefit from skilled therapeutic intervention in order to  improve on the following deficits Decreased activity tolerance;Decreased balance;Decreased endurance;Decreased mobility;Decreased range of motion;Decreased strength;Difficulty walking;Pain;Increased muscle spasms   Rehab Potential Fair   Clinical Impairments Affecting Rehab Potential Visual impairments; high risk for falls; cardiac problems; diabetes   PT Frequency 2x / week   PT Duration 8 weeks   PT Treatment/Interventions ADLs/Self Care Home Management;Electrical Stimulation;Moist Heat;Ultrasound;Therapeutic activities;Therapeutic exercise;Neuromuscular re-education;Balance training;Patient/family education;Manual techniques   PT Next Visit Plan Do BERG balance test to determine RW vs. QC; try IFC/moist heat (if helpful give patient info on self-pay TENS); abdominal brace in supine or sidelying; clams          G-Codes - January 22, 2014 1803    Functional Assessment Tool Used Modified Oswestry; clinical judgement   Functional Limitation Mobility: Walking and moving around   Mobility: Walking and Moving Around Current Status [phone]) At least 80 percent but less than 100 percent impaired, limited or restricted   Mobility: Walking and Moving Around Goal Status [phone]) At least 60 percent but less than 80 percent impaired, limited or restricted       Problem List Patient Active Problem List   Diagnosis Date Noted  . Migraine 09/07/2013  . DOE (dyspnea on exertion) 06/06/2013  . DM type 2, uncontrolled, with neuropathy 05/23/2013  . Mixed urge and stress incontinence 08/25/2011  . Ureteral stricture, right 08/25/2011  . Pyelonephritis 08/22/2011  . Nephrolithiasis 08/22/2011  . Incisional hernia 05/10/2011  . Obesity, Class III, BMI 40-49.9 (morbid obesity) 05/10/2011  . Tachycardia 05/08/2011  . DIABETES MELLITUS, TYPE II 09/30/2006  . BLINDNESS, [place], [place] DEFINITION 09/14/2006  . HYPERLIPIDEMIA 09/13/2006  . Anxiety and depression 09/13/2006  . HYPERTENSION 09/13/2006  . MYOCARDIAL  INFARCTION, HX OF 09/13/2006  . GERD 09/13/2006  . SEIZURE DISORDER 09/13/2006    Alvera Singh 2014-01-22, 6:09 PM  [facility] [address] [place], [place], [address] Phone: [phone]   Fax:  [place], [place] 01-22-2014 6:10 PM Phone: [phone] Fax: [phone]

## 2014-01-16 ENCOUNTER — Encounter: Payer: Self-pay | Admitting: Internal Medicine

## 2014-01-16 ENCOUNTER — Ambulatory Visit (INDEPENDENT_AMBULATORY_CARE_PROVIDER_SITE_OTHER): Payer: PRIVATE HEALTH INSURANCE | Admitting: Internal Medicine

## 2014-01-16 VITALS — BP 162/98 | HR 95 | Temp 98.5°F | Resp 10 | Ht 66.0 in | Wt 230.0 lb

## 2014-01-16 DIAGNOSIS — E785 Hyperlipidemia, unspecified: Secondary | ICD-10-CM

## 2014-01-16 DIAGNOSIS — I10 Essential (primary) hypertension: Secondary | ICD-10-CM

## 2014-01-16 DIAGNOSIS — N3946 Mixed incontinence: Secondary | ICD-10-CM

## 2014-01-16 DIAGNOSIS — E1165 Type 2 diabetes mellitus with hyperglycemia: Secondary | ICD-10-CM

## 2014-01-16 DIAGNOSIS — IMO0002 Reserved for concepts with insufficient information to code with codable children: Secondary | ICD-10-CM

## 2014-01-16 DIAGNOSIS — K219 Gastro-esophageal reflux disease without esophagitis: Secondary | ICD-10-CM

## 2014-01-16 DIAGNOSIS — G894 Chronic pain syndrome: Secondary | ICD-10-CM

## 2014-01-16 DIAGNOSIS — E1129 Type 2 diabetes mellitus with other diabetic kidney complication: Secondary | ICD-10-CM

## 2014-01-16 MED ORDER — INSULIN DETEMIR 100 UNIT/ML FLEXPEN: PEN_INJECTOR | SUBCUTANEOUS | Status: DC

## 2014-01-16 MED ORDER — LISINOPRIL 40 MG PO TABS: 40.0000 mg | ORAL_TABLET | Freq: Every day | ORAL | Status: DC

## 2014-01-16 NOTE — Progress Notes (Signed)
Patient ID: [name], female   DOB: [birth year], 48 y.o.   MRN: [id number]    Chief Complaint  Patient presents with  . Medical Management of Chronic Issues    3 month follow-up, discuss labs (copy printed). Patient c/o elevated blood pressure    . Foot Problem    Examine right foot, patient with burns as a results of test from pain management.    Allergies  Allergen Reactions  . Codeine Swelling and Other (See Comments)    Swelling and burning of mouth (inside)  . Lactose Intolerance (Gi) Nausea And Vomiting   HPI 48 y/o female pt is seen for routine visit. She has PMH of DM, MI, CVA, COPD, anxiety and depression.  Her bp reading at home has been elevated. SBP mostly over 140 and DBP mostly 90-100. cbg at home are greater than 300 at present She does not exercise She also goes to pain clinic ad mentions the pain regiemn is not helpful. She would like her pain med increased.  She is taking her current medication regimen  Review of Systems  Constitutional: Negative for fever, chills, has lost weight since last office visit HENT: Negative for congestion Respiratory: Negative for shortness of breath.   Cardiovascular: Negative for chest pain, palpitations and leg swelling.  Gastrointestinal: Negative for heartburn, nausea, abdominal pain, diarrhea and constipation Genitourinary: Negative for dysuria and urgency.  Musculoskeletal: Positive for back pain, joint pain (knees). Negative for falls.  Skin: Negative for itching and rash. Has burn mark on right foot post procedure at pain clinic Neurological: Negative for dizziness, tingling, sensory change and seizures.  Psychiatric/Behavioral: Positive for depression. Negative for suicidal ideas, hallucinations and substance abuse.     Past Medical History  Diagnosis Date  . Hypertension   . Asthma   . Seizures   . Heart murmur     born with   . Dysrhythmia     skipped beats  . Myocardial infarction     hi of x 2  last one  2003   . COPD (chronic obstructive pulmonary disease)     chronic bronchitis   . Pneumonia     hx of 2009  . GERD (gastroesophageal reflux disease)   . Headache(784.0)     migraines   . Neuropathy     due to diabetes   . Dizziness     secondary to diabetes and hypertension   . Arthritis     joint pain   . Hx of blood clots     hematomas removed from left side of brain from 46mo to 89yrold   . Epilepsy idiopathic petit mal     last seizure 2012;controlled w/ topomax  . Diabetic neuropathy, painful     FEET AND HANDS  . Diabetes mellitus     since age [age]type 2 IDDM  . Sleep apnea     sleep study 2010 @ [facility];does not use Cpap ; mild  . MIGRAINE HEADACHE 09/14/2006    Qualifier: Diagnosis of  By: MaHassell DoneNP, NyTori Milks  . Glaucoma     NOT ON ANY EYE DROPS   . Legally blind     left eye   . Stroke     last 2003  RESIDUAL LEFT LEG WEAKNESS--NO OTHER RESIDUAL PROBLEMS  . CVA 11/14/2007    Qualifier: Diagnosis of  By: MaHassell DoneNP, NyTori Milks  . Pain 09/23/11    LOWER BACK AND RIGHT SIDE--PT HAS RIGHT URETERAL STONE  .  Nephrolithiasis     frequent urination , urination at nite  PT SEEN IN ER 09/22/11 FOR BACK AND RT SIDED PAIN--HAS KNOWN STONE RT URETER AND UA IN ER SHOWED UTI  . Anxiety   . Depression   . Stress incontinence   . Hyperlipidemia   . Pyelonephritis    Current Outpatient Prescriptions on File Prior to Visit  Medication Sig Dispense Refill  . albuterol (PROVENTIL HFA;VENTOLIN HFA) 108 (90 BASE) MCG/ACT inhaler Inhale 2 puffs into the lungs every 6 (six) hours as needed for wheezing or shortness of breath. Wheezing 6.7 g 3  . baclofen (LIORESAL) 10 MG tablet TAKE 1 TABLET BY MOUTH THREE TIMES A DAY AS NEEDED FOR MUSCLE SPASMS 90 tablet 1  . budesonide-formoterol (SYMBICORT) 160-4.5 MCG/ACT inhaler Inhale 2 puffs into the lungs 2 (two) times daily. 1 Inhaler 3  . cetirizine (ZYRTEC) 10 MG tablet Take 10 mg by mouth at bedtime. For allergies    . clopidogrel  (PLAVIX) 75 MG tablet Take 1 tablet (75 mg total) by mouth daily. 90 tablet 1  . diclofenac (VOLTAREN) 75 MG EC tablet Take 75 mg by mouth 2 (two) times daily.    [name] EASY TOUCH PEN NEEDLES 31G X 8 MM MISC USE AS DIRECTED TO INJECT INSULIN DAILY 100 each 5  . FLUoxetine (PROZAC) 20 MG capsule Take 3 capsules (60 mg total) by mouth daily at 12 noon. 90 capsule 6  . fluticasone (FLONASE) 50 MCG/ACT nasal spray Place 2 sprays into both nostrils daily.    [name] gabapentin (NEURONTIN) 300 MG capsule TAKE 2 CAPSULES BY MOUTH THREE TIMES A DAY 180 capsule 3  . ibuprofen (ADVIL,MOTRIN) 600 MG tablet TAKE 1 TABLET BY MOUTH EVERY 8 HOURS AS NEEDED 30 tablet 0  . Insulin Lispro, Human, (HUMALOG KWIKPEN) 200 UNIT/ML SOPN Inject 30 Units into the skin 3 (three) times daily with meals. (if blood sugar >300, give 35 units) 30 mL 6  . ONE TOUCH ULTRA TEST test strip USE TO CHECK BLOOD SUGAR FOUR TIMES A DAY 100 each 1  . ONETOUCH DELICA LANCETS 74Y MISC TEST BLOOD SUGAR FOUR TIMES A DAY 200 each 5  . oxyCODONE-acetaminophen (PERCOCET/ROXICET) 5-325 MG per tablet Take 1 tablet by mouth every 8 (eight) hours as needed for severe pain. Prescribed by pain doctor    . pantoprazole (PROTONIX) 40 MG tablet TAKE 1 TABLET BY MOUTH DAILY 30 tablet 5  . ranitidine (ZANTAC) 150 MG tablet TAKE 1 TABLET BY MOUTH TWICE DAILY 60 tablet 5  . simvastatin (ZOCOR) 20 MG tablet TAKE 1 TABLET BY MOUTH EVERY MORNING FOR CHOLESTROL 30 tablet 2  . solifenacin (VESICARE) 10 MG tablet Take 1 tablet (10 mg total) by mouth daily. 30 tablet 6  . topiramate (TOPAMAX) 200 MG tablet TAKE 1 TABLET BY MOUTH TWICE DAILY 60 tablet 3  . traZODone (DESYREL) 150 MG tablet Take 1 tablet (150 mg total) by mouth at bedtime. 2 qhs (Patient taking differently: 2 by mouth at bedtime) 60 tablet 5  . valACYclovir (VALTREX) 1000 MG tablet Take 1 tablet (1,000 mg total) by mouth daily. 10 tablet 0  . verapamil (VERELAN PM) 240 MG 24 hr capsule TAKE 1 CAPSULE BY MOUTH  EVERY NIGHT AT BEDTIME FOR BLOOD PRESSURE 30 capsule 3  . Vitamin D, Ergocalciferol, (DRISDOL) 50000 UNITS CAPS capsule Take 50,000 Units by mouth every Thursday.    . Tdap (BOOSTRIX) 5-2.5-18.5 LF-MCG/0.5 injection Inject 0.5 mLs into the muscle once. (Patient not taking: Reported on  01/16/2014) 0.5 mL 0  . zolpidem (AMBIEN) 10 MG tablet Take 1 tablet (10 mg total) by mouth at bedtime as needed for sleep. 30 tablet 5   No current facility-administered medications on file prior to visit.    Physical exam BP 162/98 mmHg  Pulse 95  Temp(Src) 98.5 F (36.9 C) (Oral)  Resp 10  Ht 5' 6"  (1.676 m)  Wt 230 lb (104.327 kg)  BMI 37.14 kg/m2  SpO2 98%  Wt Readings from Last 3 Encounters:  01/16/14 230 lb (104.327 kg)  12/29/13 233 lb 12.8 oz (106.051 kg)  12/25/13 236 lb (107.049 kg)   Constitutional: obese, in no distress HENT:   Head: Normocephalic and atraumatic.   Nose: Nose normal.   Mouth/Throat: Oropharynx is clear and moist Neck: Normal range of motion. Neck supple. No thyromegaly present.  Cardiovascular: Normal rate, regular rhythm, normal heart sounds and intact distal pulses.   Pulmonary/Chest: Effort normal and breath sounds normal. No respiratory distress.  Abdominal: Soft. Bowel sounds are normal. She exhibits no distension.  Musculoskeletal: She exhibits no edema. Able to move all 4 extremities. No use of assistive device Neurological: She is alert and oriented to person, place, and time. Normal pinprick and vibration sense in her feet. No corns/ calluses Skin: Skin is warm and dry. Burn mark on right foot, no signs of infection, scar from burn noted   Labs  CBC Latest Ref Rng 09/07/2013 06/16/2013 06/05/2013  WBC 3.4 - 10.8 x10E3/uL 6.4 7.4 6.2  Hemoglobin 11.1 - 15.9 g/dL 12.6 12.3 13.2  Hematocrit 34.0 - 46.6 % 38.2 38.5 42.2  Platelets 150 - 379 x10E3/uL 294 249 276   CMP Latest Ref Rng 12/25/2013 09/07/2013 06/16/2013  Glucose 65 - 99 mg/dL 273(H) 201(H) 176(H)    BUN 6 - 24 mg/dL 15 15 16   Creatinine 0.57 - 1.00 mg/dL 0.86 0.82 0.77  Sodium 134 - 144 mmol/L 136 138 137  Potassium 3.5 - 5.2 mmol/L 4.5 4.5 4.3  Chloride 97 - 108 mmol/L 101 103 105  CO2 18 - 29 mmol/L 20 21 20   Calcium 8.7 - 10.2 mg/dL 9.2 9.4 9.0  Total Protein 6.0 - 8.5 g/dL 6.9 7.1 7.0  Albumin 3.5 - 5.5 g/dL 4.0 4.1 -  Total Bilirubin 0.0 - 1.2 mg/dL <0.2 0.2 <0.2(L)  Alkaline Phos 39 - 117 IU/L 95 83 89  AST 0 - 40 IU/L 8 11 11   ALT 0 - 32 IU/L 12 14 11    Lab Results  Component Value Date   HGBA1C 10.8* 12/25/2013   Lipid Panel     Component Value Date/Time   CHOL 163 10/07/2012 1745   TRIG 131 12/25/2013 0911   HDL 38* 12/25/2013 0911   HDL 39* 10/07/2012 1745   CHOLHDL 4.2 12/25/2013 0911   CHOLHDL 4.2 10/07/2012 1745   VLDL 19 10/07/2012 1745   LDLCALC 97 12/25/2013 0911   LDLCALC 105* 10/07/2012 1745    Assessment/plan  1. Type 2 diabetes, uncontrolled, with renal manifestation Reviewed a1c suggestive of poorly controlled DM. Increase levemir to 60 u with breakfast, lunch and dinner. Continue humalog current regimen. Monitor cbg. Will set up f/u with Oretha Ellis in a month for cbg review and adjustment of meds. Continue ACEI ad statin with gabapentin for neuropathic pain. Normal foot exam today. Need eye exam.  - Insulin Detemir (LEVEMIR FLEXTOUCH) 100 UNIT/ML Pen; Inject 60 units with breakfast, 60 units with lunch, 60 units with supper  Dispense: 45 mL; Refill: 6  2. Essential hypertension, benign Elevated bp reading. Increase lisinopril to 40 mg daily. Continue verapamil. Monitor bp readings. Emergency warning signs with high bp reading explained to patient - lisinopril (PRINIVIL,ZESTRIL) 40 MG tablet; Take 1 tablet (40 mg total) by mouth daily.  Dispense: 90 tablet; Refill: 3  3. Gastroesophageal reflux disease without esophagitis Stable, continue protonix for now  4. Mixed urge and stress incontinence Continue her vesicare 10 mg daily  5. Chronic  pain disorder Advised pt to follow with pain clinic, will not make any changes to her pain meds as she follows in pain clinic and has pain contract with them   6. Hyperlipidemia ldl at goal, continue simvastatin 20 mg daily

## 2014-01-21 DIAGNOSIS — E1129 Type 2 diabetes mellitus with other diabetic kidney complication: Secondary | ICD-10-CM | POA: Insufficient documentation

## 2014-01-21 DIAGNOSIS — E1165 Type 2 diabetes mellitus with hyperglycemia: Principal | ICD-10-CM

## 2014-01-21 DIAGNOSIS — I10 Essential (primary) hypertension: Secondary | ICD-10-CM | POA: Insufficient documentation

## 2014-01-21 DIAGNOSIS — E1122 Type 2 diabetes mellitus with diabetic chronic kidney disease: Secondary | ICD-10-CM | POA: Insufficient documentation

## 2014-01-21 DIAGNOSIS — E785 Hyperlipidemia, unspecified: Secondary | ICD-10-CM | POA: Insufficient documentation

## 2014-01-21 DIAGNOSIS — E1169 Type 2 diabetes mellitus with other specified complication: Secondary | ICD-10-CM | POA: Insufficient documentation

## 2014-01-21 DIAGNOSIS — G894 Chronic pain syndrome: Secondary | ICD-10-CM | POA: Insufficient documentation

## 2014-01-22 ENCOUNTER — Ambulatory Visit: Payer: Medicare Other | Attending: Anesthesiology | Admitting: Physical Therapy

## 2014-01-22 DIAGNOSIS — M62830 Muscle spasm of back: Secondary | ICD-10-CM

## 2014-01-22 DIAGNOSIS — M545 Low back pain: Secondary | ICD-10-CM | POA: Diagnosis present

## 2014-01-22 NOTE — Therapy (Signed)
[place], [place], [address] Phone: [phone]   Fax:  [phone]  Physical Therapy Treatment  Patient Details  Name: [name] MRN: [id number] Date of Birth: [birth year]  Encounter Date: 01/22/2014      PT End of Session - 01/22/14 1141    Visit Number 2   Number of Visits 16   Date for PT Re-Evaluation 03/06/14   PT Start Time 1100   PT Stop Time 1155   PT Time Calculation (min) 55 min   Equipment Utilized During Treatment Gait belt   Activity Tolerance Patient limited by pain      Past Medical History  Diagnosis Date  . Hypertension   . Asthma   . Seizures   . Heart murmur     born with   . Dysrhythmia     skipped beats  . Myocardial infarction     hi of x 2  last one 2003   . COPD (chronic obstructive pulmonary disease)     chronic bronchitis   . Pneumonia     hx of 2009  . GERD (gastroesophageal reflux disease)   . Headache(784.0)     migraines   . Neuropathy     due to diabetes   . Dizziness     secondary to diabetes and hypertension   . Arthritis     joint pain   . Hx of blood clots     hematomas removed from left side of brain from 19mo to 89yrold   . Epilepsy idiopathic petit mal     last seizure 2012;controlled w/ topomax  . Diabetic neuropathy, painful     FEET AND HANDS  . Diabetes mellitus     since age [age]type 2 IDDM  . Sleep apnea     sleep study 2010 @ [facility];does not use Cpap ; mild  . MIGRAINE HEADACHE 09/14/2006    Qualifier: Diagnosis of  By: MaHassell DoneNP, NyTori Milks  . Glaucoma     NOT ON ANY EYE DROPS   . Legally blind     left eye   . Stroke     last 2003  RESIDUAL LEFT LEG WEAKNESS--NO OTHER RESIDUAL PROBLEMS  . CVA 11/14/2007    Qualifier: Diagnosis of  By: MaHassell DoneNP, NyTori Milks  . Pain 09/23/11    LOWER BACK AND RIGHT SIDE--PT HAS RIGHT URETERAL STONE  . Nephrolithiasis     frequent urination , urination at nite  PT SEEN IN ER 09/22/11  FOR BACK AND RT SIDED PAIN--HAS KNOWN STONE RT URETER AND UA IN ER SHOWED UTI  . Anxiety   . Depression   . Stress incontinence   . Hyperlipidemia   . Pyelonephritis     Past Surgical History  Procedure Laterality Date  . Kidney stone removal    . Brain hematoma evacuation      five procedures total, first procedure when [age]onths old, last at [age]ears of age.  . Shunt removal      shunt inserted at age [age]emoved at age 49 . Eye surgery    . Retinal detachment surgery  1990  . Vaginal hysterectomy  1996  . Incisional hernia repair  05/05/2011    Procedure: LAPAROSCOPIC INCISIONAL HERNIA;  Surgeon: MaRolm BookbinderMD;  [facility] Service: [facility];  Laterality: N/A;  . Mass excision  05/05/2011    Procedure: EXCISION MASS;  Surgeon: MaRolm BookbinderMD;  Location[facility];  Service: General;  Laterality: Right;  . Hernia repair  05/05/11    lap incisional hernia  . Pcnl    . Cystoscopy/retrograde/ureteroscopy  08/24/2011    Procedure: CYSTOSCOPY/RETROGRADE/URETEROSCOPY;  Surgeon: Molli Hazard, MD;  Location: [facility];  Service: Urology;  Laterality: Right;  Cysto, Right retrograde Pyelogram, right stent placement.   . Cystoscopy with ureteroscopy  09/24/2011    Procedure: CYSTOSCOPY WITH URETEROSCOPY;  Surgeon: Malka So, MD;  Location: [facility];  Service: Urology;  Laterality: Right;  Balloon dilation right ureter   . Cystoscopy w/ retrogrades  09/24/2011    Procedure: CYSTOSCOPY WITH RETROGRADE PYELOGRAM;  Surgeon: Malka So, MD;  Location[facility];  Service: Urology;  Laterality: Right;    There were no vitals taken for this visit.  Visit Diagnosis:  Bilateral low back pain, with sciatica presence unspecified  Muscle spasm of back      Subjective Assessment - 01/22/14 1109    Symptoms 9/10 sharp pain, low back and Lt arm whole   Pain Location Back   Pain Orientation Right;Left   Pain Descriptors / Indicators Sharp   Pain Type Chronic pain   Pain Onset More  than a month ago   Pain Frequency Constant   Aggravating Factors  sit too long, bending   Pain Relieving Factors rocking while sitting   Multiple Pain Sites Yes   Pain Score 9   Pain Type Chronic pain   Pain Location Arm   Pain Orientation Left   Pain Descriptors / Indicators Sharp                    [facility] Adult PT Treatment/Exercise - 01/22/14 1140    Moist Heat Therapy   Number Minutes Moist Heat --  15   Moist Heat Location --  back while sitting                  PT Short Term Goals - 01/22/14 1146    PT SHORT TERM GOAL #1   Title "Independent with initial HEP   Time 4   Period Weeks   Status Unable to assess   PT SHORT TERM GOAL #2   Title Patient will report a reduction in overall pain by 15% using modalities, exercise.   Time 4   Period Weeks   Status On-going   PT SHORT TERM GOAL #3   Title Patient will ambulate safely with least restrictive assistive device 150 feet needed for short distance community ambulation.   Time 4   Period Weeks   Status Unable to assess           PT Long Term Goals - 01/09/14 1757    PT LONG TERM GOAL #1   Title "Pt will be independent with advanced HEP.    Time 8   Period Weeks   Status New   PT LONG TERM GOAL #2   Title Patient will report overall pain improved by 30% using assistive device, home/clinic modalities, postural correction, positioning techniques.   Time 8   Period Weeks   Status New   PT LONG TERM GOAL #3   Title Modified Oswestry score (assess orally) improved from 82% to 70% indicating improved function with less pain.   Time 8   Period Weeks   Status New   PT LONG TERM GOAL #4   Title Patient will be able to ambulate safely with least restrictive assistive device 200 feet needed for short  distance community ambulation to her doctor's appointments.   Time 8   Period Weeks   Status New               Plan - 01/22/14 1143    Clinical Impression Statement 24/56 BERG, Patient  at risk for falls.  She is interested in wheeled walker with seat.  She has access to a quad cane and she wants to use that until she can get the walker. No goals met,   PT Next Visit Plan IFC/ Moist heat, Self pay for TENS info if she likes, Beginner exercise         Problem List Patient Active Problem List   Diagnosis Date Noted  . Type 2 diabetes, uncontrolled, with renal manifestation 01/21/2014  . Essential hypertension, benign 01/21/2014  . Chronic pain disorder 01/21/2014  . Migraine 09/07/2013  . DOE (dyspnea on exertion) 06/06/2013  . DM type 2, uncontrolled, with neuropathy 05/23/2013  . Mixed urge and stress incontinence 08/25/2011  . Ureteral stricture, right 08/25/2011  . Pyelonephritis 08/22/2011  . Nephrolithiasis 08/22/2011  . Incisional hernia 05/10/2011  . Obesity, Class III, BMI 40-49.9 (morbid obesity) 05/10/2011  . Tachycardia 05/08/2011  . DIABETES MELLITUS, TYPE II 09/30/2006  . BLINDNESS, [place], [place] DEFINITION 09/14/2006  . HYPERLIPIDEMIA 09/13/2006  . Anxiety and depression 09/13/2006  . HYPERTENSION 09/13/2006  . MYOCARDIAL INFARCTION, HX OF 09/13/2006  . GERD 09/13/2006  . SEIZURE DISORDER 09/13/2006   Melvenia Needles, PTA 01/22/2014 11:48 AM Phone: [phone] Fax: [phone]   [facility] 01/22/2014, 11:48 AM  [facility] [address] [place], [place], [address] Phone: [phone]   Fax:  [phone]

## 2014-01-22 NOTE — Patient Instructions (Signed)
Patient to get a quad cane to help with balance.

## 2014-01-23 ENCOUNTER — Telehealth: Payer: Self-pay | Admitting: *Deleted

## 2014-01-23 NOTE — Telephone Encounter (Signed)
Patient called and stated that she is having problems with the Pain [facility] prescribing her pain medication. She stated that she has done everything that they have asked her to do and still won't prescribe it. She is trying to get her Percocet and they are putting her through it. They wanted her to do PT, so she went. They are now waiting on Paperwork from all her other previous Dr. Before they will give her Rx. Patient wants to know if you will increase her Ibuprofen until they start prescribing her Percocet. Please Advise.

## 2014-01-24 ENCOUNTER — Other Ambulatory Visit: Payer: Self-pay | Admitting: Nurse Practitioner

## 2014-01-24 ENCOUNTER — Other Ambulatory Visit: Payer: Self-pay | Admitting: Internal Medicine

## 2014-01-24 NOTE — Telephone Encounter (Signed)
I would not recommend to increase her advil to make appt with pain management and discuss concerns with them.

## 2014-01-24 NOTE — Telephone Encounter (Signed)
Patient Notified and agreed.

## 2014-01-25 ENCOUNTER — Ambulatory Visit: Payer: Medicare Other | Admitting: Physical Therapy

## 2014-01-25 DIAGNOSIS — M545 Low back pain: Secondary | ICD-10-CM

## 2014-01-25 DIAGNOSIS — M62830 Muscle spasm of back: Secondary | ICD-10-CM

## 2014-01-25 NOTE — Patient Instructions (Signed)
Lower Trunk Rotation   Bring both knees in to chest. Rotate from side to side, keeping knees together and feet off floor. Repeat ___5_ times per set. Do ___1_ sets per session. Do1___ sessions per day.  http://orth.exer.us/151   Copyright  VHI. All rights reserved.  Also issued standard lumbar stabilization2 exercises for home.

## 2014-01-25 NOTE — Therapy (Signed)
[place], [place], [address] Phone: [phone]   Fax:  [phone]  Physical Therapy Treatment  Patient Details  Name: [name] MRN: [id number] Date of Birth: 05-25-1965 Referring Provider:  Lauree Chandler, NP  Encounter Date: 01/25/2014      PT End of Session - 01/25/14 1142    Visit Number 3   Number of Visits 16   Date for PT Re-Evaluation 03/06/14   PT Start Time 1110   PT Stop Time 1156   PT Time Calculation (min) 46 min   Equipment Utilized During Treatment Gait belt   Activity Tolerance Patient tolerated treatment well      Past Medical History  Diagnosis Date  . Hypertension   . Asthma   . Seizures   . Heart murmur     born with   . Dysrhythmia     skipped beats  . Myocardial infarction     hi of x 2  last one 2003   . COPD (chronic obstructive pulmonary disease)     chronic bronchitis   . Pneumonia     hx of 2009  . GERD (gastroesophageal reflux disease)   . Headache(784.0)     migraines   . Neuropathy     due to diabetes   . Dizziness     secondary to diabetes and hypertension   . Arthritis     joint pain   . Hx of blood clots     hematomas removed from left side of brain from 35mo to 49yrold   . Epilepsy idiopathic petit mal     last seizure 2012;controlled w/ topomax  . Diabetic neuropathy, painful     FEET AND HANDS  . Diabetes mellitus     since age [age]type 2 IDDM  . Sleep apnea     sleep study 2010 [facility];does not use Cpap ; mild  . MIGRAINE HEADACHE 09/14/2006    Qualifier: Diagnosis of  By: MaHassell DoneNP, NyTori Milks  . Glaucoma     NOT ON ANY EYE DROPS   . Legally blind     left eye   . Stroke     last 2003  RESIDUAL LEFT LEG WEAKNESS--NO OTHER RESIDUAL PROBLEMS  . CVA 11/14/2007    Qualifier: Diagnosis of  By: MaHassell DoneNP, NyTori Milks  . Pain 09/23/11    LOWER BACK AND RIGHT SIDE--PT HAS RIGHT URETERAL STONE  . Nephrolithiasis    frequent urination , urination at nite  PT SEEN IN ER 09/22/11 FOR BACK AND RT SIDED PAIN--HAS KNOWN STONE RT URETER AND UA IN ER SHOWED UTI  . Anxiety   . Depression   . Stress incontinence   . Hyperlipidemia   . Pyelonephritis     Past Surgical History  Procedure Laterality Date  . Kidney stone removal    . Brain hematoma evacuation      five procedures total, first procedure when [age]onths old, last at [age]ears of age.  . Shunt removal      shunt inserted at age [age]emoved at age 49 . Eye surgery    . Retinal detachment surgery  1990  . Vaginal hysterectomy  1996  . Incisional hernia repair  05/05/2011    Procedure: LAPAROSCOPIC INCISIONAL HERNIA;  Surgeon: MaRolm BookbinderMD;  [facility] Service: General;  Laterality: N/A;  . Mass excision  05/05/2011    Procedure: EXCISION MASS;  Surgeon: Rolm Bookbinder, MD;  Location: [facility];  Service: General;  Laterality: Right;  . Hernia repair  05/05/11    lap incisional hernia  . Pcnl    . Cystoscopy/retrograde/ureteroscopy  08/24/2011    Procedure: CYSTOSCOPY/RETROGRADE/URETEROSCOPY;  Surgeon: Molli Hazard, MD;  [facility];  Service: Urology;  Laterality: Right;  Cysto, Right retrograde Pyelogram, right stent placement.   . Cystoscopy with ureteroscopy  09/24/2011    Procedure: CYSTOSCOPY WITH URETEROSCOPY;  Surgeon: Malka So, MD;  Location: [facility];  Service: Urology;  Laterality: Right;  Balloon dilation right ureter   . Cystoscopy w/ retrogrades  09/24/2011    Procedure: CYSTOSCOPY WITH RETROGRADE PYELOGRAM;  Surgeon: Malka So, MD;  Location[facility];  Service: Urology;  Laterality: Right;    There were no vitals taken for this visit.  Visit Diagnosis:  Bilateral low back pain, with sciatica presence unspecified  Muscle spasm of back      Subjective Assessment - 01/25/14 1116    Currently in Pain? Yes   Pain Score 8    Pain Location Back   Pain Orientation Right;Left;Posterior   Pain Descriptors /  Indicators Sharp   Pain Type Chronic pain   Pain Radiating Towards arm   Pain Onset More than a month ago   Pain Frequency Constant   Aggravating Factors  sit too long, bending   Pain Relieving Factors heat   Effect of Pain on Daily Activities limits   Multiple Pain Sites Yes   Pain Score 8   Pain Location Leg   Pain Orientation Left   Pain Radiating Towards toward foot   Pain Descriptors / Indicators Radiating  edge poking   Pain Frequency Intermittent   Pain Onset Awakened from sleep                    [facility] Adult PT Treatment/Exercise - 01/25/14 1123    Lumbar Exercises: Stretches   Single Knee to Chest Stretch 4 reps  5 seconds hold, painful   Lumbar Exercises: Supine   Ab Set 5 reps  cued for breathing   Clam 5 reps  with bracing   Heel Slides 5 reps  WITH BRACING   Moist Heat Therapy   Number Minutes Moist Heat 10 Minutes   Moist Heat Location --  back                PT Education - 01/25/14 1135    Education provided Yes   Person(s) Educated Patient   Methods Explanation;Demonstration;Handout   Comprehension Verbalized understanding;Returned demonstration          PT Short Term Goals - 01/25/14 1144    PT SHORT TERM GOAL #1   Title "Independent with initial HEP   Time 4   Period Weeks   Status On-going   PT SHORT TERM GOAL #2   Title Patient will report a reduction in overall pain by 15% using modalities, exercise.   Time 4   Period Weeks   Status On-going   PT SHORT TERM GOAL #3   Title Patient will ambulate safely with least restrictive assistive device 150 feet needed for short distance community ambulation.   Time 4   Period Weeks   Status Unable to assess           PT Long Term Goals - 01/09/14 1757    PT LONG TERM GOAL #1   Title "Pt will be independent with advanced HEP.    Time  8   Period Weeks   Status New   PT LONG TERM GOAL #2   Title Patient will report overall pain improved by 30% using assistive  device, home/clinic modalities, postural correction, positioning techniques.   Time 8   Period Weeks   Status New   PT LONG TERM GOAL #3   Title Modified Oswestry score (assess orally) improved from 82% to 70% indicating improved function with less pain.   Time 8   Period Weeks   Status New   PT LONG TERM GOAL #4   Title Patient will be able to ambulate safely with least restrictive assistive device 200 feet needed for short distance community ambulation to her doctor's appointments.   Time 8   Period Weeks   Status New               Plan - 01/25/14 1143    Clinical Impression Statement able to begin home exercise program.   PT Next Visit Plan continue to build home exercise program.  Try Nustep   Consulted and Agree with Plan of Care Patient        Problem List Patient Active Problem List   Diagnosis Date Noted  . Type 2 diabetes, uncontrolled, with renal manifestation 01/21/2014  . Essential hypertension, benign 01/21/2014  . Chronic pain disorder 01/21/2014  . Migraine 09/07/2013  . DOE (dyspnea on exertion) 06/06/2013  . DM type 2, uncontrolled, with neuropathy 05/23/2013  . Mixed urge and stress incontinence 08/25/2011  . Ureteral stricture, right 08/25/2011  . Pyelonephritis 08/22/2011  . Nephrolithiasis 08/22/2011  . Incisional hernia 05/10/2011  . Obesity, Class III, BMI 40-49.9 (morbid obesity) 05/10/2011  . Tachycardia 05/08/2011  . DIABETES MELLITUS, TYPE II 09/30/2006  . BLINDNESS, [place], [place] DEFINITION 09/14/2006  . HYPERLIPIDEMIA 09/13/2006  . Anxiety and depression 09/13/2006  . HYPERTENSION 09/13/2006  . MYOCARDIAL INFARCTION, HX OF 09/13/2006  . GERD 09/13/2006  . SEIZURE DISORDER 09/13/2006   Melvenia Needles, PTA 01/25/2014 11:46 AM Phone: [phone] Fax: [phone]  [facility] 01/25/2014, 11:46 AM  [facility] [address] [place], [place], [address] Phone: [phone]   Fax:   [phone]

## 2014-01-29 ENCOUNTER — Ambulatory Visit: Payer: Medicare Other | Admitting: Physical Therapy

## 2014-01-29 ENCOUNTER — Ambulatory Visit: Payer: Medicaid Other | Admitting: Pharmacotherapy

## 2014-01-29 ENCOUNTER — Telehealth: Payer: Self-pay

## 2014-01-29 NOTE — Telephone Encounter (Signed)
Patient called indicating she need referral to another pain management doctor. Patient was sent for substance abuse counseling by current pain management doctor (due to running out of medication 3 days early), patient went to counseling. Patient was seen today and medication was not given. Patient said the reason why rx was not given was because she did not go to a preferred substance abuse, she went to one cover by her insurance but they would like for her to go to one they prefer.  Patient would like referral to [facility] in [place].

## 2014-01-30 ENCOUNTER — Other Ambulatory Visit (HOSPITAL_COMMUNITY): Payer: Self-pay | Admitting: Psychiatry

## 2014-01-30 ENCOUNTER — Ambulatory Visit: Payer: Medicare Other | Admitting: Physical Therapy

## 2014-01-30 DIAGNOSIS — M545 Low back pain: Secondary | ICD-10-CM | POA: Diagnosis not present

## 2014-01-30 DIAGNOSIS — M62830 Muscle spasm of back: Secondary | ICD-10-CM

## 2014-01-30 NOTE — Telephone Encounter (Signed)
Per Sherrie Mustache (NP), patient should remain with current pain management provider. Janett Billow would like this message reviewed by Dr.Pandey to confirm decision

## 2014-01-30 NOTE — Telephone Encounter (Signed)
i agree with [name]'s decision

## 2014-01-30 NOTE — Therapy (Signed)
[facility], [place], [address] Phone: [phone]   Fax:  [phone]  Physical Therapy Treatment  Patient Details  Name: [name] MRN: [id number] Date of Birth: 1965-01-23 Referring Provider:  Lauree Chandler, NP  Encounter Date: 01/30/2014      PT End of Session - 01/30/14 1336    Visit Number 4   Number of Visits 16   Date for PT Re-Evaluation 03/06/14   PT Start Time 1103   PT Stop Time 1150   PT Time Calculation (min) 47 min   Activity Tolerance Patient tolerated treatment well      Past Medical History  Diagnosis Date  . Hypertension   . Asthma   . Seizures   . Heart murmur     born with   . Dysrhythmia     skipped beats  . Myocardial infarction     hi of x 2  last one 2003   . COPD (chronic obstructive pulmonary disease)     chronic bronchitis   . Pneumonia     hx of 2009  . GERD (gastroesophageal reflux disease)   . Headache(784.0)     migraines   . Neuropathy     due to diabetes   . Dizziness     secondary to diabetes and hypertension   . Arthritis     joint pain   . Hx of blood clots     hematomas removed from left side of brain from 40mo to 89yrold   . Epilepsy idiopathic petit mal     last seizure 2012;controlled w/ topomax  . Diabetic neuropathy, painful     FEET AND HANDS  . Diabetes mellitus     since age [age]type 2 IDDM  . Sleep apnea     sleep study 2010 @ [facility];does not use Cpap ; mild  . MIGRAINE HEADACHE 09/14/2006    Qualifier: Diagnosis of  By: MaHassell DoneNP, NyTori Milks  . Glaucoma     NOT ON ANY EYE DROPS   . Legally blind     left eye   . Stroke     last 2003  RESIDUAL LEFT LEG WEAKNESS--NO OTHER RESIDUAL PROBLEMS  . CVA 11/14/2007    Qualifier: Diagnosis of  By: MaHassell DoneNP, NyTori Milks  . Pain 09/23/11    LOWER BACK AND RIGHT SIDE--PT HAS RIGHT URETERAL STONE  . Nephrolithiasis     frequent urination , urination at nite  PT SEEN IN ER  09/22/11 FOR BACK AND RT SIDED PAIN--HAS KNOWN STONE RT URETER AND UA IN ER SHOWED UTI  . Anxiety   . Depression   . Stress incontinence   . Hyperlipidemia   . Pyelonephritis     Past Surgical History  Procedure Laterality Date  . Kidney stone removal    . Brain hematoma evacuation      five procedures total, first procedure when [age]onths old, last at [age]ears of age.  . Shunt removal      shunt inserted at age [age]emoved at age 49 . Eye surgery    . Retinal detachment surgery  1990  . Vaginal hysterectomy  1996  . Incisional hernia repair  05/05/2011    Procedure: LAPAROSCOPIC INCISIONAL HERNIA;  Surgeon: MaRolm BookbinderMD;  [facility] Service: General;  Laterality: N/A;  . Mass excision  05/05/2011    Procedure: EXCISION MASS;  Surgeon: MaRolm BookbinderMD;  Location: [facility];  Service: General;  Laterality: Right;  . Hernia repair  05/05/11    lap incisional hernia  . Pcnl    . Cystoscopy/retrograde/ureteroscopy  08/24/2011    Procedure: CYSTOSCOPY/RETROGRADE/URETEROSCOPY;  Surgeon: Molli Hazard, MD;  Location: [facility];  Service: Urology;  Laterality: Right;  Cysto, Right retrograde Pyelogram, right stent placement.   . Cystoscopy with ureteroscopy  09/24/2011    Procedure: CYSTOSCOPY WITH URETEROSCOPY;  Surgeon: Malka So, MD;  Location: [facility];  Service: Urology;  Laterality: Right;  Balloon dilation right ureter   . Cystoscopy w/ retrogrades  09/24/2011    Procedure: CYSTOSCOPY WITH RETROGRADE PYELOGRAM;  Surgeon: Malka So, MD;  Location: [facility];  Service: Urology;  Laterality: Right;    There were no vitals taken for this visit.  Visit Diagnosis:  Bilateral low back pain, with sciatica presence unspecified  Muscle spasm of back      Subjective Assessment - 01/30/14 1111    Symptoms Reports she is changing pain doctors but has not made the change yet.   Awaiting order to be signed by PCP for rolling walker. Patient states they have received the  request.   Didn't sleep well last night and had to get in the recliner because the pain was so bad.  Looking into a hospital bed or adjustable bed for home.     How long can you walk comfortably? Tried to walk 2-3 houses down the street but had to turn around and come back because of shortness of breath and pain.     Currently in Pain? Yes   Pain Score 9    Pain Location Shoulder   Pain Orientation Right   Pain Type Chronic pain   Pain Onset More than a month ago   Pain Frequency Constant   Aggravating Factors  lying flat   Pain Relieving Factors heat;  recliner position   Pain Score 8   Pain Type Chronic pain   Pain Location Back                    [facility] Adult PT Treatment/Exercise - 01/30/14 1114    Lumbar Exercises: Aerobic   Stationary Bike --  Nu-Step L1 5 min   Lumbar Exercises: Seated   LAQ on Chair Weights (lbs) --  red band right/left 10x each   Other Seated Lumbar Exercises --  Ab brace with 2# plyo ball shoulder to shoulder 1 min   Lumbar Exercises: Supine   Ab Set 5 reps   Isometric Hip Flexion 5 reps   Other Supine Lumbar Exercises hip abd/ER red band   Moist Heat Therapy   Number Minutes Moist Heat 12 Minutes   Moist Heat Location --  Lumbar (supine with wedge under head)                  PT Short Term Goals - 01/30/14 1342    PT SHORT TERM GOAL #1   Title "Independent with initial HEP   Time 4   Period Weeks   Status On-going   PT SHORT TERM GOAL #2   Title Patient will report a reduction in overall pain by 15% using modalities, exercise.   Status Achieved   PT SHORT TERM GOAL #3   Title Patient will ambulate safely with least restrictive assistive device 150 feet needed for short distance community ambulation.   Time 4   Period Weeks   Status On-going  PT Long Term Goals - 01/30/14 1343    PT LONG TERM GOAL #1   Title "Pt will be independent with advanced HEP.    Time 8   Period Weeks   Status On-going   PT  LONG TERM GOAL #2   Title Patient will report overall pain improved by 30% using assistive device, home/clinic modalities, postural correction, positioning techniques.   Time 8   Period Weeks   Status On-going   PT LONG TERM GOAL #3   Title Modified Oswestry score (assess orally) improved from 82% to 70% indicating improved function with less pain.   Time 8   Period Weeks   Status On-going   PT LONG TERM GOAL #4   Title Patient will be able to ambulate safely with least restrictive assistive device 200 feet needed for short distance community ambulation to her doctor's appointments.   Time 8   Period Weeks   Status On-going               Plan - 01/30/14 1337    Clinical Impression Statement Despite chronic and intense upper and lower back pain 8/10, the patient is able to participate in low level core strengthening exercises with some modifications (head of bead elevated for supine position).  The patient lacks abominal strength for more intermediate level exercises and needs verbal and tactile cues for technqiue.  Patient willing to try new exercise and states "I like this (Nu-Step)".  States overall she is 20% better.     Clinical Impairments Affecting Rehab Potential Visual impairments; high risk for falls; cardiac problems; diabetes   PT Next Visit Plan Continue with core low level strengthening program in supine (head of bed elevated), seated and standing, continue Nu-Step, work on gait endurance with RW; continue to check for signed order for RW from MD;heat        Problem List Patient Active Problem List   Diagnosis Date Noted  . Type 2 diabetes, uncontrolled, with renal manifestation 01/21/2014  . Essential hypertension, benign 01/21/2014  . Chronic pain disorder 01/21/2014  . Migraine 09/07/2013  . DOE (dyspnea on exertion) 06/06/2013  . DM type 2, uncontrolled, with neuropathy 05/23/2013  . Mixed urge and stress incontinence 08/25/2011  . Ureteral stricture,  right 08/25/2011  . Pyelonephritis 08/22/2011  . Nephrolithiasis 08/22/2011  . Incisional hernia 05/10/2011  . Obesity, Class III, BMI 40-49.9 (morbid obesity) 05/10/2011  . Tachycardia 05/08/2011  . DIABETES MELLITUS, TYPE II 09/30/2006  . BLINDNESS, [place], [place] DEFINITION 09/14/2006  . HYPERLIPIDEMIA 09/13/2006  . Anxiety and depression 09/13/2006  . HYPERTENSION 09/13/2006  . MYOCARDIAL INFARCTION, HX OF 09/13/2006  . GERD 09/13/2006  . SEIZURE DISORDER 09/13/2006    Alvera Singh 01/30/2014, 1:44 PM  [facility] [address] [place], [place], [address] Phone: [phone]   Fax:  [phone]   Ruben Im, PT 01/30/2014 1:45 PM Phone: [phone] Fax: [phone]

## 2014-01-31 ENCOUNTER — Ambulatory Visit: Payer: Medicare Other | Admitting: Rehabilitation

## 2014-01-31 NOTE — Telephone Encounter (Signed)
Patient aware of Jessica's response. Patient states her current pain management doctor treats her as if she is a drug addict, patient feels they do not care about her. Patient has done everything asked of her and pain management will not release her rx. Patient would like for Janett Billow to reconsider referring her to another pain management doctor. Patient said she is not willing to go back to her current pain management doctor.   I offered patient an appointment to further address, patient is without transportation right now and not able to come in .

## 2014-01-31 NOTE — Telephone Encounter (Signed)
Called home number- [employer]por[employer] disconnected Called number listed as work number: This is patient's daughter cell phone, I was instructed to call patient on her cell phone (number below provided)  Called [phone]: Left message on voicemail for patient to return call when available

## 2014-02-01 ENCOUNTER — Ambulatory Visit: Payer: Medicare Other | Admitting: Physical Therapy

## 2014-02-01 DIAGNOSIS — M545 Low back pain: Secondary | ICD-10-CM | POA: Diagnosis not present

## 2014-02-01 DIAGNOSIS — M62830 Muscle spasm of back: Secondary | ICD-10-CM

## 2014-02-01 NOTE — Therapy (Signed)
[place], [place], [address] Phone: [phone]   Fax:  [phone]  Physical Therapy Treatment  Patient Details  Name: [name] MRN: [id number] Date of Birth: 12-24-1965 Referring Provider:  Lauree Chandler, NP  Encounter Date: 02/01/2014      PT End of Session - 02/01/14 1159    Visit Number 5   Number of Visits 16   Date for PT Re-Evaluation 03/06/14   PT Start Time 1700   PT Stop Time 1158   PT Time Calculation (min) 53 min   Activity Tolerance Patient tolerated treatment well;Patient limited by pain      Past Medical History  Diagnosis Date  . Hypertension   . Asthma   . Seizures   . Heart murmur     born with   . Dysrhythmia     skipped beats  . Myocardial infarction     hi of x 2  last one 2003   . COPD (chronic obstructive pulmonary disease)     chronic bronchitis   . Pneumonia     hx of 2009  . GERD (gastroesophageal reflux disease)   . Headache(784.0)     migraines   . Neuropathy     due to diabetes   . Dizziness     secondary to diabetes and hypertension   . Arthritis     joint pain   . Hx of blood clots     hematomas removed from left side of brain from 7mo to 89yrold   . Epilepsy idiopathic petit mal     last seizure 2012;controlled w/ topomax  . Diabetic neuropathy, painful     FEET AND HANDS  . Diabetes mellitus     since age [age]type 2 IDDM  . Sleep apnea     sleep study 2010 @ [facility];does not use Cpap ; mild  . MIGRAINE HEADACHE 09/14/2006    Qualifier: Diagnosis of  By: MaHassell DoneNP, NyTori Milks  . Glaucoma     NOT ON ANY EYE DROPS   . Legally blind     left eye   . Stroke     last 2003  RESIDUAL LEFT LEG WEAKNESS--NO OTHER RESIDUAL PROBLEMS  . CVA 11/14/2007    Qualifier: Diagnosis of  By: MaHassell DoneNP, NyTori Milks  . Pain 09/23/11    LOWER BACK AND RIGHT SIDE--PT HAS RIGHT URETERAL STONE  . Nephrolithiasis     frequent urination , urination  at nite  PT SEEN IN ER 09/22/11 FOR BACK AND RT SIDED PAIN--HAS KNOWN STONE RT URETER AND UA IN ER SHOWED UTI  . Anxiety   . Depression   . Stress incontinence   . Hyperlipidemia   . Pyelonephritis     Past Surgical History  Procedure Laterality Date  . Kidney stone removal    . Brain hematoma evacuation      five procedures total, first procedure when 89onths old, last at [age]ears of age.  . Shunt removal      shunt inserted at age [age]emoved at age 89 . Eye surgery    . Retinal detachment surgery  1990  . Vaginal hysterectomy  1996  . Incisional hernia repair  05/05/2011    Procedure: LAPAROSCOPIC INCISIONAL HERNIA;  Surgeon: MaRolm BookbinderMD;  [facility] Service: General;  Laterality: N/A;  . Mass excision  05/05/2011    Procedure: EXCISION MASS;  Surgeon: MaRodman Key  Donne Hazel, MD;  Location: [facility];  Service: General;  Laterality: Right;  . Hernia repair  05/05/11    lap incisional hernia  . Pcnl    . Cystoscopy/retrograde/ureteroscopy  08/24/2011    Procedure: CYSTOSCOPY/RETROGRADE/URETEROSCOPY;  Surgeon: Molli Hazard, MD;  Location: [facility];  Service: Urology;  Laterality: Right;  Cysto, Right retrograde Pyelogram, right stent placement.   . Cystoscopy with ureteroscopy  09/24/2011    Procedure: CYSTOSCOPY WITH URETEROSCOPY;  Surgeon: Malka So, MD;  Location: [facility];  Service: Urology;  Laterality: Right;  Balloon dilation right ureter   . Cystoscopy w/ retrogrades  09/24/2011    Procedure: CYSTOSCOPY WITH RETROGRADE PYELOGRAM;  Surgeon: Malka So, MD;  Location: [facility];  Service: Urology;  Laterality: Right;    There were no vitals taken for this visit.  Visit Diagnosis:  Bilateral low back pain, with sciatica presence unspecified  Muscle spasm of back      Subjective Assessment - 02/01/14 1105    Symptoms Lt leg and arm are stronger . Pain is 8/10. Sleeping a little better.  Sometimes pain is improving.   Currently in Pain? Yes   Pain Score 8     Pain Location Shoulder   Pain Orientation Right   Pain Descriptors / Indicators Sharp   Pain Radiating Towards arm   Aggravating Factors  lying flat , reaching   Pain Relieving Factors heat, recliner   Pain Score 8   Pain Location Back   Pain Orientation Left   Pain Radiating Towards toward foot                     [facility] Adult PT Treatment/Exercise - 02/01/14 1110    Lumbar Exercises: Stretches   Lower Trunk Rotation 5 reps;10 seconds  cued for breathing   Pelvic Tilt 10 seconds  5 second holds   Lumbar Exercises: Supine   Bridge Limitations  3 reps, head of bed elevated   Knee/Hip Exercises: Seated   Other Seated Knee Exercises red band knee flexion added to home exercise program   Knee/Hip Exercises: Supine   Short Arc Quad Sets Left;2 sets  3,8 lbs.   Knee Flexion 1 set;Left  manually resisted 10 reps   Shoulder Exercises: Supine   Other Supine Exercises yellow band issued for ER bilateral nd elbow flexion and extension                PT Education - 02/01/14 1158    Education provided Yes   Education Details bands for knee flexion, shoulder ER and elbow.   Person(s) Educated Patient          PT Short Term Goals - 02/01/14 1205    PT SHORT TERM GOAL #1   Title "Independent with initial HEP   Time 4   Period Weeks   Status On-going   PT SHORT TERM GOAL #2   Title Patient will report a reduction in overall pain by 15% using modalities, exercise.   Time 4   Period Weeks   Status Achieved   PT SHORT TERM GOAL #3   Title Patient will ambulate safely with least restrictive assistive device 150 feet needed for short distance community ambulation.   Time 4   Period Weeks   Status Unable to assess           PT Long Term Goals - 02/01/14 1205    PT LONG TERM GOAL #1   Title "Pt will be independent with  advanced HEP.    Time 8   Period Weeks   Status On-going   PT LONG TERM GOAL #2   Title Patient will report overall pain improved by  30% using assistive device, home/clinic modalities, postural correction, positioning techniques.   Time 8   Period Weeks   Status On-going   PT LONG TERM GOAL #3   Title Modified Oswestry score (assess orally) improved from 82% to 70% indicating improved function with less pain.   Time 8   Period Weeks   Status Unable to assess   PT LONG TERM GOAL #4   Title Patient will be able to ambulate safely with least restrictive assistive device 200 feet needed for short distance community ambulation to her doctor's appointments.   Time 8   Period Weeks   Status On-going               Plan - 02/01/14 1201    Clinical Impression Statement Able to progress home exercises.  pain continues.  Self care today focus on getting her walker.   PT Next Visit Plan Continue with core low level strengthening program in supine (head of bed elevated), seated and standing, continue Nu-Step, work on gait endurance with RW;heat   Consulted and Agree with Plan of Care Patient        Problem List Patient Active Problem List   Diagnosis Date Noted  . Type 2 diabetes, uncontrolled, with renal manifestation 01/21/2014  . Essential hypertension, benign 01/21/2014  . Chronic pain disorder 01/21/2014  . Migraine 09/07/2013  . DOE (dyspnea on exertion) 06/06/2013  . DM type 2, uncontrolled, with neuropathy 05/23/2013  . Mixed urge and stress incontinence 08/25/2011  . Ureteral stricture, right 08/25/2011  . Pyelonephritis 08/22/2011  . Nephrolithiasis 08/22/2011  . Incisional hernia 05/10/2011  . Obesity, Class III, BMI 40-49.9 (morbid obesity) 05/10/2011  . Tachycardia 05/08/2011  . DIABETES MELLITUS, TYPE II 09/30/2006  . BLINDNESS, [place], [place] DEFINITION 09/14/2006  . HYPERLIPIDEMIA 09/13/2006  . Anxiety and depression 09/13/2006  . HYPERTENSION 09/13/2006  . MYOCARDIAL INFARCTION, HX OF 09/13/2006  . GERD 09/13/2006  . SEIZURE DISORDER 09/13/2006  Melvenia Needles, PTA 02/01/2014 12:07 PM Phone:  [phone] Fax: [phone]   [facility] 02/01/2014, 12:07 PM  [place], [place], [address] Phone: [phone]   Fax:  [phone]

## 2014-02-01 NOTE — Patient Instructions (Signed)
Directions to 2 different places to get her rolling walker with seat.  [facility]. Suggested patient call in advance to find out cost.

## 2014-02-05 ENCOUNTER — Ambulatory Visit: Payer: Medicare Other | Admitting: Rehabilitation

## 2014-02-05 ENCOUNTER — Encounter: Payer: Self-pay | Admitting: Pharmacotherapy

## 2014-02-05 ENCOUNTER — Ambulatory Visit: Payer: PRIVATE HEALTH INSURANCE | Admitting: Pharmacotherapy

## 2014-02-05 ENCOUNTER — Ambulatory Visit (INDEPENDENT_AMBULATORY_CARE_PROVIDER_SITE_OTHER): Payer: 59 | Admitting: Pharmacotherapy

## 2014-02-05 VITALS — HR 78

## 2014-02-05 VITALS — BP 136/88 | HR 84 | Temp 98.6°F | Ht 66.0 in | Wt 229.0 lb

## 2014-02-05 DIAGNOSIS — E1129 Type 2 diabetes mellitus with other diabetic kidney complication: Secondary | ICD-10-CM

## 2014-02-05 DIAGNOSIS — M62830 Muscle spasm of back: Secondary | ICD-10-CM

## 2014-02-05 DIAGNOSIS — M545 Low back pain: Secondary | ICD-10-CM | POA: Diagnosis not present

## 2014-02-05 DIAGNOSIS — E1165 Type 2 diabetes mellitus with hyperglycemia: Secondary | ICD-10-CM

## 2014-02-05 DIAGNOSIS — I10 Essential (primary) hypertension: Secondary | ICD-10-CM

## 2014-02-05 DIAGNOSIS — IMO0002 Reserved for concepts with insufficient information to code with codable children: Secondary | ICD-10-CM

## 2014-02-05 MED ORDER — INSULIN GLARGINE 300 UNIT/ML ~~LOC~~ SOPN: 90.0000 [IU] | PEN_INJECTOR | Freq: Two times a day (BID) | SUBCUTANEOUS | Status: DC

## 2014-02-05 NOTE — Patient Instructions (Signed)
Stop Levemir Start Toujeo 90 units twice daily Continue Humalog 30 units with each meal

## 2014-02-05 NOTE — Therapy (Signed)
[place], [place], [address] Phone: [phone]   Fax:  [phone]  Physical Therapy Treatment  Patient Details  Name: [name] MRN: [id number] Date of Birth: 04/04/1965 Referring Provider:  Lauree Chandler, NP  Encounter Date: 02/05/2014      PT End of Session - 02/05/14 1129    Visit Number 6   Number of Visits 16   Date for PT Re-Evaluation 03/06/14   PT Start Time 4496   PT Stop Time 1145   PT Time Calculation (min) 40 min      Past Medical History  Diagnosis Date  . Hypertension   . Asthma   . Seizures   . Heart murmur     born with   . Dysrhythmia     skipped beats  . Myocardial infarction     hi of x 2  last one 2003   . COPD (chronic obstructive pulmonary disease)     chronic bronchitis   . Pneumonia     hx of 2009  . GERD (gastroesophageal reflux disease)   . Headache(784.0)     migraines   . Neuropathy     due to diabetes   . Dizziness     secondary to diabetes and hypertension   . Arthritis     joint pain   . Hx of blood clots     hematomas removed from left side of brain from 89mo to 89yrold   . Epilepsy idiopathic petit mal     last seizure 2012;controlled w/ topomax  . Diabetic neuropathy, painful     FEET AND HANDS  . Diabetes mellitus     since age [age]type 2 IDDM  . Sleep apnea     sleep study 2010 @ [facility];does not use Cpap ; mild  . MIGRAINE HEADACHE 09/14/2006    Qualifier: Diagnosis of  By: MaHassell DoneNP, NyTori Milks  . Glaucoma     NOT ON ANY EYE DROPS   . Legally blind     left eye   . Stroke     last 2003  RESIDUAL LEFT LEG WEAKNESS--NO OTHER RESIDUAL PROBLEMS  . CVA 11/14/2007    Qualifier: Diagnosis of  By: MaHassell DoneNP, NyTori Milks  . Pain 09/23/11    LOWER BACK AND RIGHT SIDE--PT HAS RIGHT URETERAL STONE  . Nephrolithiasis     frequent urination , urination at nite  PT SEEN IN ER 09/22/11 FOR BACK AND RT SIDED PAIN--HAS KNOWN STONE RT  URETER AND UA IN ER SHOWED UTI  . Anxiety   . Depression   . Stress incontinence   . Hyperlipidemia   . Pyelonephritis     Past Surgical History  Procedure Laterality Date  . Kidney stone removal    . Brain hematoma evacuation      five procedures total, first procedure when [age]onths old, last at [age]ears of age.  . Shunt removal      shunt inserted at age [age]emoved at age 49 . Eye surgery    . Retinal detachment surgery  1990  . Vaginal hysterectomy  1996  . Incisional hernia repair  05/05/2011    Procedure: LAPAROSCOPIC INCISIONAL HERNIA;  Surgeon: MaRolm BookbinderMD;  [facility] Service: General;  Laterality: N/A;  . Mass excision  05/05/2011    Procedure: EXCISION MASS;  Surgeon: MaRolm BookbinderMD;  [facility] Service: General;  Laterality:  Right;  . Hernia repair  05/05/11    lap incisional hernia  . Pcnl    . Cystoscopy/retrograde/ureteroscopy  08/24/2011    Procedure: CYSTOSCOPY/RETROGRADE/URETEROSCOPY;  Surgeon: Molli Hazard, MD;  Location: [facility];  Service: Urology;  Laterality: Right;  Cysto, Right retrograde Pyelogram, right stent placement.   . Cystoscopy with ureteroscopy  09/24/2011    Procedure: CYSTOSCOPY WITH URETEROSCOPY;  Surgeon: Malka So, MD;  Location: [facility];  Service: Urology;  Laterality: Right;  Balloon dilation right ureter   . Cystoscopy w/ retrogrades  09/24/2011    Procedure: CYSTOSCOPY WITH RETROGRADE PYELOGRAM;  Surgeon: Malka So, MD;  Location: [facility];  Service: Urology;  Laterality: Right;    Pulse 78  SpO2 96%  Visit Diagnosis:  Bilateral low back pain, with sciatica presence unspecified  Muscle spasm of back      Subjective Assessment - 02/05/14 1117    Symptoms I tried to change the lined on my bed and make the bed.After 3-4 minutes I experienced SOB and right side pain, relieved by laying down.    Pertinent History seizures; cardiac history   How long can you walk comfortably? Tried to walk 2-3 houses  down the street but had to turn around and come back because of shortness of breath and pain.     Currently in Pain? Yes   Pain Score 7   between a 7 -8 /10   Pain Location --  Left leg, lateral thigh/hip and low back across to both sides   Pain Descriptors / Indicators Sharp  demonstrates pain by hitting fist into hand.    Pain Type Chronic pain   Pain Frequency Constant   Aggravating Factors  prolonged walking    Pain Relieving Factors heat, recliner                    [facility] Adult PT Treatment/Exercise - 02/05/14 1127    Lumbar Exercises: Aerobic   Stationary Bike Nustep level 3 UE/LE    Lumbar Exercises: Supine   Ab Set 10 reps   Glut Set 5 seconds;15 reps   Clam 10 reps   Heel Slides 10 reps   Isometric Hip Flexion 10 reps   Knee/Hip Exercises: Seated   Other Seated Knee Exercises red band knee flexion 10x2   Knee/Hip Exercises: Supine   Short Arc Quad Sets Left;2 sets  8lbs                  PT Short Term Goals - 02/01/14 1205    PT SHORT TERM GOAL #1   Title "Independent with initial HEP   Time 4   Period Weeks   Status On-going   PT SHORT TERM GOAL #2   Title Patient will report a reduction in overall pain by 15% using modalities, exercise.   Time 4   Period Weeks   Status Achieved   PT SHORT TERM GOAL #3   Title Patient will ambulate safely with least restrictive assistive device 150 feet needed for short distance community ambulation.   Time 4   Period Weeks   Status Unable to assess           PT Long Term Goals - 02/01/14 1205    PT LONG TERM GOAL #1   Title "Pt will be independent with advanced HEP.    Time 8   Period Weeks   Status On-going   PT LONG TERM GOAL #2   Title Patient will report  overall pain improved by 30% using assistive device, home/clinic modalities, postural correction, positioning techniques.   Time 8   Period Weeks   Status On-going   PT LONG TERM GOAL #3   Title Modified Oswestry score (assess  orally) improved from 82% to 70% indicating improved function with less pain.   Time 8   Period Weeks   Status Unable to assess   PT LONG TERM GOAL #4   Title Patient will be able to ambulate safely with least restrictive assistive device 200 feet needed for short distance community ambulation to her doctor's appointments.   Time 8   Period Weeks   Status On-going               Plan - 02/05/14 1145    Clinical Impression Statement Pt reports pain may be a little less. She is interested in buying a heating pad.    PT Next Visit Plan Continue with core low level strengthening program in supine (head of bed elevated), seated and standing, continue Nu-Step, work on gait endurance with RW;heat        Problem List Patient Active Problem List   Diagnosis Date Noted  . Type 2 diabetes, uncontrolled, with renal manifestation 01/21/2014  . Essential hypertension, benign 01/21/2014  . Chronic pain disorder 01/21/2014  . Migraine 09/07/2013  . DOE (dyspnea on exertion) 06/06/2013  . DM type 2, uncontrolled, with neuropathy 05/23/2013  . Mixed urge and stress incontinence 08/25/2011  . Ureteral stricture, right 08/25/2011  . Pyelonephritis 08/22/2011  . Nephrolithiasis 08/22/2011  . Incisional hernia 05/10/2011  . Obesity, Class III, BMI 40-49.9 (morbid obesity) 05/10/2011  . Tachycardia 05/08/2011  . DIABETES MELLITUS, TYPE II 09/30/2006  . BLINDNESS, [place], [place] DEFINITION 09/14/2006  . HYPERLIPIDEMIA 09/13/2006  . Anxiety and depression 09/13/2006  . HYPERTENSION 09/13/2006  . MYOCARDIAL INFARCTION, HX OF 09/13/2006  . GERD 09/13/2006  . SEIZURE DISORDER 09/13/2006    Dorene Ar, PTA 02/05/2014, 11:46 AM  [place], [place], [address] Phone: [phone]   Fax:  [phone]

## 2014-02-05 NOTE — Progress Notes (Signed)
  Subjective:    [name] is a 49 y.o.African American female who presents for follow-up of Type 2 diabetes mellitus.   Her last A1C was 10.8%.  At urologist, her A1C was 11%. She continues to have urinary incontinence and nocturia.  She self reports BG 300-400 range. No hypoglycemia.  Denies missed doses of insulin.  Trying to eat healthy choices.  Not skipping meals. Does physical therapy for exercise. Denies problems with feet. Wears corrective lenses.  Still has blurry vision. Peripheral edema present. Nocturia 3-4 times per night.  Review of Systems A comprehensive review of systems was negative except for: Eyes: positive for contacts/glasses and blurry vision Cardiovascular: positive for lower extremity edema Genitourinary: positive for frequency, nocturia and urinary incontinence Musculoskeletal: positive for arthralgias Neurological: positive for gait problems and weakness    Objective:    BP 136/88 mmHg  Pulse 84  Temp(Src) 98.6 F (37 C) (Oral)  Ht 5' 6"  (1.676 m)  Wt 229 lb (103.874 kg)  BMI 36.98 kg/m2  SpO2 98%  General:  alert, cooperative and no distress  Oropharynx: normal findings: lips normal without lesions and gums healthy   Eyes:  negative findings: lids and lashes normal and conjunctivae and sclerae normal   Ears:  external ears normal        Lung: clear to auscultation bilaterally  Heart:  regular rate and rhythm     Extremities: edema trace bilateral lower extremities  Skin: dry     Neuro: mental status, speech normal, alert and oriented x3 and gait and station normal   Lab Review GLUCOSE (mg/dL)  Date Value  12/25/2013 273*  09/07/2013 201*  06/05/2013 291*   GLUCOSE, BLD (mg/dL)  Date Value  06/16/2013 176*  02/06/2013 197*  02/01/2013 102*   CO2  Date Value  12/25/2013 20 mmol/L  09/07/2013 21 mmol/L  06/16/2013 20 mEq/L   BUN (mg/dL)  Date Value  12/25/2013 15  09/07/2013 15  06/16/2013 16  06/05/2013 15   02/06/2013 15  02/01/2013 14   CREAT (mg/dL)  Date Value  10/07/2012 0.89   CREATININE, SER (mg/dL)  Date Value  12/25/2013 0.86  09/07/2013 0.82  06/16/2013 0.77       Assessment:    Diabetes Mellitus type II, under poor control. A1C above target <7%. BP at <140/90   Plan:    1.  Rx changes: stop Levemir.  Start Toujeo 90 units twice daily.  Using concentrated insulin to improve absorption. 2.  Continue Humalog 30 units with each meal. 3.  Counseled on nutrition goals. 4.  Exercise goal is 30-45 minutes 5 x week. 5.  Counseled on foot care. 6.  Counseled on complications of uncontrolled DM 7.  HTN at goal <140/90.

## 2014-02-07 ENCOUNTER — Ambulatory Visit: Payer: Medicare Other | Admitting: Rehabilitation

## 2014-02-13 ENCOUNTER — Encounter: Payer: Self-pay | Admitting: Physical Therapy

## 2014-02-15 ENCOUNTER — Encounter: Payer: Self-pay | Admitting: Physical Therapy

## 2014-02-15 ENCOUNTER — Other Ambulatory Visit: Payer: Self-pay | Admitting: *Deleted

## 2014-02-15 MED ORDER — IBUPROFEN 600 MG PO TABS: ORAL_TABLET | ORAL | Status: DC

## 2014-02-15 NOTE — Telephone Encounter (Signed)
Patient requested to be faxed to pharmacy

## 2014-02-20 ENCOUNTER — Encounter: Payer: Self-pay | Admitting: Physical Therapy

## 2014-02-21 ENCOUNTER — Other Ambulatory Visit: Payer: Self-pay | Admitting: Internal Medicine

## 2014-02-22 ENCOUNTER — Encounter (HOSPITAL_COMMUNITY): Payer: Self-pay | Admitting: Emergency Medicine

## 2014-02-22 ENCOUNTER — Emergency Department (HOSPITAL_COMMUNITY): Payer: Medicare Other

## 2014-02-22 ENCOUNTER — Inpatient Hospital Stay (HOSPITAL_COMMUNITY)
Admission: EM | Admit: 2014-02-22 | Discharge: 2014-03-01 | DRG: 392 | Disposition: A | Discharge status: Home / Self Care | Payer: Medicare Other | Attending: Internal Medicine | Admitting: Internal Medicine

## 2014-02-22 DIAGNOSIS — I252 Old myocardial infarction: Secondary | ICD-10-CM

## 2014-02-22 DIAGNOSIS — I10 Essential (primary) hypertension: Secondary | ICD-10-CM | POA: Diagnosis present

## 2014-02-22 DIAGNOSIS — H409 Unspecified glaucoma: Secondary | ICD-10-CM | POA: Diagnosis present

## 2014-02-22 DIAGNOSIS — F419 Anxiety disorder, unspecified: Secondary | ICD-10-CM | POA: Diagnosis present

## 2014-02-22 DIAGNOSIS — H548 Legal blindness, as defined in USA: Secondary | ICD-10-CM | POA: Diagnosis present

## 2014-02-22 DIAGNOSIS — G894 Chronic pain syndrome: Secondary | ICD-10-CM | POA: Diagnosis not present

## 2014-02-22 DIAGNOSIS — J45909 Unspecified asthma, uncomplicated: Secondary | ICD-10-CM | POA: Diagnosis present

## 2014-02-22 DIAGNOSIS — E1129 Type 2 diabetes mellitus with other diabetic kidney complication: Secondary | ICD-10-CM

## 2014-02-22 DIAGNOSIS — Z90710 Acquired absence of both cervix and uterus: Secondary | ICD-10-CM

## 2014-02-22 DIAGNOSIS — J449 Chronic obstructive pulmonary disease, unspecified: Secondary | ICD-10-CM | POA: Diagnosis present

## 2014-02-22 DIAGNOSIS — Z794 Long term (current) use of insulin: Secondary | ICD-10-CM

## 2014-02-22 DIAGNOSIS — Z87440 Personal history of urinary (tract) infections: Secondary | ICD-10-CM

## 2014-02-22 DIAGNOSIS — I2510 Atherosclerotic heart disease of native coronary artery without angina pectoris: Secondary | ICD-10-CM | POA: Diagnosis present

## 2014-02-22 DIAGNOSIS — Z6836 Body mass index (BMI) 36.0-36.9, adult: Secondary | ICD-10-CM

## 2014-02-22 DIAGNOSIS — Z8059 Family history of malignant neoplasm of other urinary tract organ: Secondary | ICD-10-CM

## 2014-02-22 DIAGNOSIS — K529 Noninfective gastroenteritis and colitis, unspecified: Secondary | ICD-10-CM | POA: Diagnosis not present

## 2014-02-22 DIAGNOSIS — Z79899 Other long term (current) drug therapy: Secondary | ICD-10-CM

## 2014-02-22 DIAGNOSIS — Z8249 Family history of ischemic heart disease and other diseases of the circulatory system: Secondary | ICD-10-CM

## 2014-02-22 DIAGNOSIS — G40409 Other generalized epilepsy and epileptic syndromes, not intractable, without status epilepticus: Secondary | ICD-10-CM | POA: Diagnosis present

## 2014-02-22 DIAGNOSIS — Z791 Long term (current) use of non-steroidal anti-inflammatories (NSAID): Secondary | ICD-10-CM

## 2014-02-22 DIAGNOSIS — E1165 Type 2 diabetes mellitus with hyperglycemia: Secondary | ICD-10-CM | POA: Diagnosis present

## 2014-02-22 DIAGNOSIS — G8929 Other chronic pain: Secondary | ICD-10-CM | POA: Diagnosis present

## 2014-02-22 DIAGNOSIS — R1031 Right lower quadrant pain: Secondary | ICD-10-CM

## 2014-02-22 DIAGNOSIS — R109 Unspecified abdominal pain: Secondary | ICD-10-CM

## 2014-02-22 DIAGNOSIS — G4733 Obstructive sleep apnea (adult) (pediatric): Secondary | ICD-10-CM | POA: Diagnosis present

## 2014-02-22 DIAGNOSIS — E1140 Type 2 diabetes mellitus with diabetic neuropathy, unspecified: Secondary | ICD-10-CM | POA: Diagnosis present

## 2014-02-22 DIAGNOSIS — IMO0002 Reserved for concepts with insufficient information to code with codable children: Secondary | ICD-10-CM

## 2014-02-22 DIAGNOSIS — R1010 Upper abdominal pain, unspecified: Secondary | ICD-10-CM | POA: Diagnosis not present

## 2014-02-22 DIAGNOSIS — G4700 Insomnia, unspecified: Secondary | ICD-10-CM

## 2014-02-22 DIAGNOSIS — E66813 Obesity, class 3: Secondary | ICD-10-CM | POA: Diagnosis present

## 2014-02-22 DIAGNOSIS — Z8673 Personal history of transient ischemic attack (TIA), and cerebral infarction without residual deficits: Secondary | ICD-10-CM

## 2014-02-22 DIAGNOSIS — K5900 Constipation, unspecified: Secondary | ICD-10-CM | POA: Diagnosis present

## 2014-02-22 DIAGNOSIS — F329 Major depressive disorder, single episode, unspecified: Secondary | ICD-10-CM | POA: Diagnosis present

## 2014-02-22 DIAGNOSIS — K219 Gastro-esophageal reflux disease without esophagitis: Secondary | ICD-10-CM | POA: Diagnosis not present

## 2014-02-22 DIAGNOSIS — E785 Hyperlipidemia, unspecified: Secondary | ICD-10-CM | POA: Diagnosis present

## 2014-02-22 DIAGNOSIS — Z87442 Personal history of urinary calculi: Secondary | ICD-10-CM

## 2014-02-22 DIAGNOSIS — Z8049 Family history of malignant neoplasm of other genital organs: Secondary | ICD-10-CM

## 2014-02-22 DIAGNOSIS — Z87891 Personal history of nicotine dependence: Secondary | ICD-10-CM

## 2014-02-22 DIAGNOSIS — M1990 Unspecified osteoarthritis, unspecified site: Secondary | ICD-10-CM | POA: Diagnosis present

## 2014-02-22 LAB — GLUCOSE, CAPILLARY
Glucose-Capillary: 182 mg/dL — ABNORMAL HIGH (ref 70–99)
Glucose-Capillary: 185 mg/dL — ABNORMAL HIGH (ref 70–99)

## 2014-02-22 LAB — CBC WITH DIFFERENTIAL/PLATELET
Basophils Absolute: 0 10*3/uL (ref 0.0–0.1)
Basophils Relative: 0 % (ref 0–1)
Eosinophils Absolute: 0.1 10*3/uL (ref 0.0–0.7)
Eosinophils Relative: 1 % (ref 0–5)
HCT: 37 % (ref 36.0–46.0)
Hemoglobin: 12 g/dL (ref 12.0–15.0)
Lymphocytes Relative: 20 % (ref 12–46)
Lymphs Abs: 1.3 10*3/uL (ref 0.7–4.0)
MCH: 30 pg (ref 26.0–34.0)
MCHC: 32.4 g/dL (ref 30.0–36.0)
MCV: 92.5 fL (ref 78.0–100.0)
Monocytes Absolute: 0.6 10*3/uL (ref 0.1–1.0)
Monocytes Relative: 9 % (ref 3–12)
Neutro Abs: 4.7 10*3/uL (ref 1.7–7.7)
Neutrophils Relative %: 70 % (ref 43–77)
Platelets: 212 10*3/uL (ref 150–400)
RBC: 4 MIL/uL (ref 3.87–5.11)
RDW: 13.4 % (ref 11.5–15.5)
WBC: 6.7 10*3/uL (ref 4.0–10.5)

## 2014-02-22 LAB — URINALYSIS, ROUTINE W REFLEX MICROSCOPIC
Bilirubin Urine: NEGATIVE
Glucose, UA: >1000 mg/dL — AB
Ketones, ur: NEGATIVE mg/dL
Leukocytes, UA: NEGATIVE
Nitrite: NEGATIVE
Protein, ur: 30 mg/dL — AB
Specific Gravity, Urine: 1.041 — ABNORMAL HIGH (ref 1.005–1.030)
Urobilinogen, UA: 0.2 mg/dL (ref 0.0–1.0)
pH: 5.5 (ref 5.0–8.0)

## 2014-02-22 LAB — COMPREHENSIVE METABOLIC PANEL
ALT: 12 U/L (ref 0–35)
AST: 12 U/L (ref 0–37)
Albumin: 3.2 g/dL — ABNORMAL LOW (ref 3.5–5.2)
Alkaline Phosphatase: 76 U/L (ref 39–117)
Anion gap: 7 (ref 5–15)
BUN: 13 mg/dL (ref 6–23)
CO2: 23 mmol/L (ref 19–32)
Calcium: 8.4 mg/dL (ref 8.4–10.5)
Chloride: 109 mmol/L (ref 96–112)
Creatinine, Ser: 0.79 mg/dL (ref 0.50–1.10)
GFR calc Af Amer: >90 mL/min (ref >90)
GFR calc non Af Amer: >90 mL/min (ref >90)
Glucose, Bld: 251 mg/dL — ABNORMAL HIGH (ref 70–99)
Potassium: 3.1 mmol/L — ABNORMAL LOW (ref 3.5–5.1)
Sodium: 139 mmol/L (ref 135–145)
Total Bilirubin: 0.4 mg/dL (ref 0.3–1.2)
Total Protein: 7 g/dL (ref 6.0–8.3)

## 2014-02-22 LAB — SEDIMENTATION RATE: Sed Rate: 22 mm/hr (ref 0–22)

## 2014-02-22 LAB — LACTIC ACID, PLASMA: Lactic Acid, Venous: 1.2 mmol/L (ref 0.5–2.0)

## 2014-02-22 LAB — URINE MICROSCOPIC-ADD ON

## 2014-02-22 MED ORDER — CIPROFLOXACIN IN D5W 400 MG/200ML IV SOLN
400.0000 mg | Freq: Once | INTRAVENOUS | Status: AC
  Administered 2014-02-22: 400 mg via INTRAVENOUS
  Filled 2014-02-22: qty 200

## 2014-02-22 MED ORDER — HYDROMORPHONE HCL 1 MG/ML IJ SOLN
1.0000 mg | Freq: Once | INTRAMUSCULAR | Status: AC
  Administered 2014-02-22: 1 mg via INTRAVENOUS
  Filled 2014-02-22: qty 1

## 2014-02-22 MED ORDER — ZOLPIDEM TARTRATE 5 MG PO TABS
5.0000 mg | ORAL_TABLET | Freq: Every evening | ORAL | Status: DC | PRN
  Administered 2014-02-23: 5 mg via ORAL
  Filled 2014-02-22: qty 1

## 2014-02-22 MED ORDER — METRONIDAZOLE IN NACL 5-0.79 MG/ML-% IV SOLN
500.0000 mg | Freq: Three times a day (TID) | INTRAVENOUS | Status: DC
  Administered 2014-02-23 – 2014-03-01 (×20): 500 mg via INTRAVENOUS
  Filled 2014-02-22 (×21): qty 100

## 2014-02-22 MED ORDER — ONDANSETRON HCL 4 MG/2ML IJ SOLN
4.0000 mg | Freq: Once | INTRAMUSCULAR | Status: AC
  Administered 2014-02-22: 4 mg via INTRAVENOUS
  Filled 2014-02-22: qty 2

## 2014-02-22 MED ORDER — INSULIN GLARGINE 100 UNIT/ML ~~LOC~~ SOLN
45.0000 [IU] | Freq: Two times a day (BID) | SUBCUTANEOUS | Status: DC
  Administered 2014-02-22 – 2014-03-01 (×14): 45 [IU] via SUBCUTANEOUS
  Filled 2014-02-22 (×17): qty 0.45

## 2014-02-22 MED ORDER — VERAPAMIL HCL ER 240 MG PO TBCR
240.0000 mg | EXTENDED_RELEASE_TABLET | Freq: Every day | ORAL | Status: DC
  Administered 2014-02-22 – 2014-02-28 (×7): 240 mg via ORAL
  Filled 2014-02-22 (×8): qty 1

## 2014-02-22 MED ORDER — ALBUTEROL SULFATE (2.5 MG/3ML) 0.083% IN NEBU: 2.5000 mg | INHALATION_SOLUTION | RESPIRATORY_TRACT | Status: DC | PRN

## 2014-02-22 MED ORDER — FAMOTIDINE 20 MG PO TABS
20.0000 mg | ORAL_TABLET | Freq: Every day | ORAL | Status: DC
  Administered 2014-02-23: 20 mg via ORAL
  Filled 2014-02-22: qty 1

## 2014-02-22 MED ORDER — POTASSIUM CHLORIDE 10 MEQ/100ML IV SOLN
10.0000 meq | INTRAVENOUS | Status: AC
  Administered 2014-02-22 (×2): 10 meq via INTRAVENOUS
  Filled 2014-02-22 (×2): qty 100

## 2014-02-22 MED ORDER — TOPIRAMATE 100 MG PO TABS
200.0000 mg | ORAL_TABLET | Freq: Two times a day (BID) | ORAL | Status: DC
  Administered 2014-02-22 – 2014-03-01 (×14): 200 mg via ORAL
  Filled 2014-02-22 (×16): qty 2

## 2014-02-22 MED ORDER — IOHEXOL 300 MG/ML  SOLN
50.0000 mL | Freq: Once | INTRAMUSCULAR | Status: AC | PRN
  Administered 2014-02-22: 50 mL via ORAL

## 2014-02-22 MED ORDER — GABAPENTIN 300 MG PO CAPS
300.0000 mg | ORAL_CAPSULE | Freq: Three times a day (TID) | ORAL | Status: DC
  Administered 2014-02-22 – 2014-03-01 (×19): 300 mg via ORAL
  Filled 2014-02-22 (×22): qty 1

## 2014-02-22 MED ORDER — POTASSIUM CHLORIDE CRYS ER 20 MEQ PO TBCR
40.0000 meq | EXTENDED_RELEASE_TABLET | Freq: Once | ORAL | Status: DC
  Filled 2014-02-22: qty 2

## 2014-02-22 MED ORDER — CETYLPYRIDINIUM CHLORIDE 0.05 % MT LIQD
7.0000 mL | Freq: Two times a day (BID) | OROMUCOSAL | Status: DC
  Administered 2014-02-23 – 2014-03-01 (×9): 7 mL via OROMUCOSAL

## 2014-02-22 MED ORDER — CIPROFLOXACIN IN D5W 400 MG/200ML IV SOLN
400.0000 mg | Freq: Two times a day (BID) | INTRAVENOUS | Status: DC
  Administered 2014-02-23 – 2014-03-01 (×13): 400 mg via INTRAVENOUS
  Filled 2014-02-22 (×15): qty 200

## 2014-02-22 MED ORDER — HYDROCODONE-ACETAMINOPHEN 5-325 MG PO TABS
1.0000 | ORAL_TABLET | ORAL | Status: DC | PRN
  Administered 2014-02-22 – 2014-02-23 (×2): 1 via ORAL
  Administered 2014-02-23 – 2014-02-27 (×6): 2 via ORAL
  Filled 2014-02-22: qty 2
  Filled 2014-02-22: qty 1
  Filled 2014-02-22: qty 2
  Filled 2014-02-22: qty 1
  Filled 2014-02-22 (×5): qty 2

## 2014-02-22 MED ORDER — INSULIN ASPART 100 UNIT/ML ~~LOC~~ SOLN
0.0000 [IU] | Freq: Four times a day (QID) | SUBCUTANEOUS | Status: DC
  Administered 2014-02-23 (×2): 3 [IU] via SUBCUTANEOUS
  Administered 2014-02-24 (×4): 2 [IU] via SUBCUTANEOUS
  Administered 2014-02-25: 3 [IU] via SUBCUTANEOUS
  Administered 2014-02-25 – 2014-02-26 (×2): 2 [IU] via SUBCUTANEOUS
  Administered 2014-02-26: 3 [IU] via SUBCUTANEOUS
  Administered 2014-02-26: 2 [IU] via SUBCUTANEOUS
  Administered 2014-02-26 – 2014-02-28 (×4): 3 [IU] via SUBCUTANEOUS

## 2014-02-22 MED ORDER — ENOXAPARIN SODIUM 40 MG/0.4ML ~~LOC~~ SOLN
40.0000 mg | SUBCUTANEOUS | Status: DC
  Administered 2014-02-22 – 2014-02-28 (×7): 40 mg via SUBCUTANEOUS
  Filled 2014-02-22 (×8): qty 0.4

## 2014-02-22 MED ORDER — FLUTICASONE PROPIONATE 50 MCG/ACT NA SUSP
2.0000 | Freq: Every day | NASAL | Status: DC
  Administered 2014-02-25 – 2014-03-01 (×5): 2 via NASAL
  Filled 2014-02-22: qty 16

## 2014-02-22 MED ORDER — DARIFENACIN HYDROBROMIDE ER 15 MG PO TB24
15.0000 mg | ORAL_TABLET | Freq: Every day | ORAL | Status: DC
  Administered 2014-02-23 – 2014-03-01 (×7): 15 mg via ORAL
  Filled 2014-02-22 (×7): qty 1

## 2014-02-22 MED ORDER — CHLORHEXIDINE GLUCONATE 0.12 % MT SOLN
15.0000 mL | Freq: Two times a day (BID) | OROMUCOSAL | Status: DC
  Administered 2014-02-22 – 2014-03-01 (×13): 15 mL via OROMUCOSAL
  Filled 2014-02-22 (×17): qty 15

## 2014-02-22 MED ORDER — BUDESONIDE-FORMOTEROL FUMARATE 160-4.5 MCG/ACT IN AERO
2.0000 | INHALATION_SPRAY | Freq: Two times a day (BID) | RESPIRATORY_TRACT | Status: DC
  Administered 2014-02-22 – 2014-03-01 (×14): 2 via RESPIRATORY_TRACT
  Filled 2014-02-22 (×2): qty 6

## 2014-02-22 MED ORDER — METRONIDAZOLE IN NACL 5-0.79 MG/ML-% IV SOLN
500.0000 mg | Freq: Three times a day (TID) | INTRAVENOUS | Status: DC
  Filled 2014-02-22 (×2): qty 100

## 2014-02-22 MED ORDER — METRONIDAZOLE IN NACL 5-0.79 MG/ML-% IV SOLN
500.0000 mg | Freq: Once | INTRAVENOUS | Status: AC
  Administered 2014-02-22: 500 mg via INTRAVENOUS
  Filled 2014-02-22: qty 100

## 2014-02-22 MED ORDER — TRAZODONE HCL 150 MG PO TABS
150.0000 mg | ORAL_TABLET | Freq: Every day | ORAL | Status: DC
  Administered 2014-02-22 – 2014-02-28 (×7): 150 mg via ORAL
  Filled 2014-02-22 (×8): qty 1

## 2014-02-22 MED ORDER — ACETAMINOPHEN 325 MG PO TABS: 650.0000 mg | ORAL_TABLET | Freq: Four times a day (QID) | ORAL | Status: DC | PRN

## 2014-02-22 MED ORDER — CLOPIDOGREL BISULFATE 75 MG PO TABS
75.0000 mg | ORAL_TABLET | Freq: Every day | ORAL | Status: DC
  Administered 2014-02-23 – 2014-03-01 (×7): 75 mg via ORAL
  Filled 2014-02-22 (×7): qty 1

## 2014-02-22 MED ORDER — BACLOFEN 10 MG PO TABS
10.0000 mg | ORAL_TABLET | Freq: Three times a day (TID) | ORAL | Status: DC
  Administered 2014-02-22 – 2014-03-01 (×19): 10 mg via ORAL
  Filled 2014-02-22 (×22): qty 1

## 2014-02-22 MED ORDER — LISINOPRIL 40 MG PO TABS
40.0000 mg | ORAL_TABLET | Freq: Every day | ORAL | Status: DC
  Administered 2014-02-23 – 2014-03-01 (×7): 40 mg via ORAL
  Filled 2014-02-22 (×7): qty 1

## 2014-02-22 MED ORDER — SODIUM CHLORIDE 0.9 % IV SOLN
INTRAVENOUS | Status: DC
  Administered 2014-02-22 – 2014-02-28 (×8): via INTRAVENOUS

## 2014-02-22 MED ORDER — VERAPAMIL HCL ER 240 MG PO CP24: 240.0000 mg | ORAL_CAPSULE | Freq: Every day | ORAL | Status: DC

## 2014-02-22 MED ORDER — IOHEXOL 300 MG/ML  SOLN
100.0000 mL | Freq: Once | INTRAMUSCULAR | Status: AC | PRN
  Administered 2014-02-22: 100 mL via INTRAVENOUS

## 2014-02-22 MED ORDER — INSULIN GLARGINE 300 UNIT/ML ~~LOC~~ SOPN: 45.0000 [IU] | PEN_INJECTOR | Freq: Two times a day (BID) | SUBCUTANEOUS | Status: DC

## 2014-02-22 MED ORDER — FLUOXETINE HCL 20 MG PO CAPS
60.0000 mg | ORAL_CAPSULE | Freq: Every day | ORAL | Status: DC
Start: 2014-02-23 — End: 2014-03-01
  Administered 2014-02-23 – 2014-03-01 (×7): 60 mg via ORAL
  Filled 2014-02-22 (×7): qty 3

## 2014-02-22 MED ORDER — ACETAMINOPHEN 650 MG RE SUPP: 650.0000 mg | Freq: Four times a day (QID) | RECTAL | Status: DC | PRN

## 2014-02-22 MED ORDER — POTASSIUM CHLORIDE 20 MEQ PO PACK
40.0000 meq | PACK | Freq: Once | ORAL | Status: DC
  Filled 2014-02-22: qty 2

## 2014-02-22 MED ORDER — SIMVASTATIN 20 MG PO TABS
20.0000 mg | ORAL_TABLET | Freq: Every day | ORAL | Status: DC
  Filled 2014-02-22: qty 1

## 2014-02-22 NOTE — Consult Note (Signed)
Reason for Consult:4 days of abdominal and back pain Referring Physician Rogene Houston MD  [name] is an 49 y.o. female.  HPI: Aske dto see pt at the request of Dr Rogene Houston for abdominal pain for 4 days.  Pt has chronic abdominal and low back pain and is followed by pain management.  Pain in abdomen worsened 4 days ago.  Patient has pain in both lower quadrants and  Left upper quadrant.  Constant and sharp.  Duration 4 days.  Feels different than chronic baseline lower abdominal pain. NO vomiting and denies diarrhea but stools loose.  Blood in urine according to patient. No fever or chills.   Hx of incisional hernia repair in 2013 with mesh by Dr Donne Hazel. Never had colonoscopy.  CT shows mild dilation of appendix and questionable appendicolith.  She has a thickened descending colon consistent with colitis.     Past Medical History  Diagnosis Date  . Hypertension   . Asthma   . Seizures   . Heart murmur     born with   . Dysrhythmia     skipped beats  . Myocardial infarction     hi of x 2  last one 2003   . COPD (chronic obstructive pulmonary disease)     chronic bronchitis   . Pneumonia     hx of 2009  . GERD (gastroesophageal reflux disease)   . Headache(784.0)     migraines   . Neuropathy     due to diabetes   . Dizziness     secondary to diabetes and hypertension   . Arthritis     joint pain   . Hx of blood clots     hematomas removed from left side of brain from 30mo to 89yrold   . Epilepsy idiopathic petit mal     last seizure 2012;controlled w/ topomax  . Diabetic neuropathy, painful     FEET AND HANDS  . Diabetes mellitus     since age [age]type 2 IDDM  . Sleep apnea     sleep study [facility];does not use Cpap ; mild  . MIGRAINE HEADACHE 09/14/2006    Qualifier: Diagnosis of  By: MaHassell DoneNP, NyTori Milks  . Glaucoma     NOT ON ANY EYE DROPS   . Legally blind     left eye   . Stroke     last 2003  RESIDUAL LEFT LEG WEAKNESS--NO OTHER RESIDUAL  PROBLEMS  . CVA 11/14/2007    Qualifier: Diagnosis of  By: MaHassell DoneNP, NyTori Milks  . Pain 09/23/11    LOWER BACK AND RIGHT SIDE--PT HAS RIGHT URETERAL STONE  . Nephrolithiasis     frequent urination , urination at nite  PT SEEN IN ER 09/22/11 FOR BACK AND RT SIDED PAIN--HAS KNOWN STONE RT URETER AND UA IN ER SHOWED UTI  . Anxiety   . Depression   . Stress incontinence   . Hyperlipidemia   . Pyelonephritis     Past Surgical History  Procedure Laterality Date  . Kidney stone removal    . Brain hematoma evacuation      five procedures total, first procedure when [age]onths old, last at [age]ears of age.  . Shunt removal      shunt inserted at age [age]emoved at age 49 . Eye surgery    . Retinal detachment surgery  1990  . Vaginal hysterectomy  1996  . Incisional hernia repair  05/05/2011  Procedure: LAPAROSCOPIC INCISIONAL HERNIA;  Surgeon: Rolm Bookbinder, MD;  Location: [facility];  Service: General;  Laterality: N/A;  . Mass excision  05/05/2011    Procedure: EXCISION MASS;  Surgeon: Rolm Bookbinder, MD;  Location: [facility];  Service: General;  Laterality: Right;  . Hernia repair  05/05/11    lap incisional hernia  . Pcnl    . Cystoscopy/retrograde/ureteroscopy  08/24/2011    Procedure: CYSTOSCOPY/RETROGRADE/URETEROSCOPY;  Surgeon: Molli Hazard, MD;  Location: [facility];  Service: Urology;  Laterality: Right;  Cysto, Right retrograde Pyelogram, right stent placement.   . Cystoscopy with ureteroscopy  09/24/2011    Procedure: CYSTOSCOPY WITH URETEROSCOPY;  Surgeon: Malka So, MD;  Location: [facility];  Service: Urology;  Laterality: Right;  Balloon dilation right ureter   . Cystoscopy w/ retrogrades  09/24/2011    Procedure: CYSTOSCOPY WITH RETROGRADE PYELOGRAM;  Surgeon: Malka So, MD;  Location: [facility];  Service: Urology;  Laterality: Right;    Family History  Problem Relation Age of Onset  . Cancer Mother     uterine  . Hypertension Mother   . Cancer Maternal Grandmother      brain  . Hypertension Maternal Grandmother   . Anesthesia problems Neg Hx     Social History:  reports that she quit smoking about 27 years ago. Her smoking use included Cigarettes. She has a 30 pack-year smoking history. She has never used smokeless tobacco. She reports that she does not drink alcohol or use illicit drugs.  Allergies:  Allergies  Allergen Reactions  . Codeine Swelling and Other (See Comments)    Swelling and burning of mouth (inside)  . Lactose Intolerance (Gi) Nausea And Vomiting    Medications: I have reviewed the patient's current medications.  Results for orders placed or performed during the hospital encounter of 02/22/14 (from the past 48 hour(s))  Urinalysis, Routine w reflex microscopic     Status: Abnormal   Collection Time: 02/22/14  8:37 AM  Result Value Ref Range   Color, Urine YELLOW YELLOW   APPearance CLEAR CLEAR   Specific Gravity, Urine 1.041 (H) 1.005 - 1.030   pH 5.5 5.0 - 8.0   Glucose, UA >1000 (A) NEGATIVE mg/dL   Hgb urine dipstick TRACE (A) NEGATIVE   Bilirubin Urine NEGATIVE NEGATIVE   Ketones, ur NEGATIVE NEGATIVE mg/dL   Protein, ur 30 (A) NEGATIVE mg/dL   Urobilinogen, UA 0.2 0.0 - 1.0 mg/dL   Nitrite NEGATIVE NEGATIVE   Leukocytes, UA NEGATIVE NEGATIVE  Urine microscopic-add on     Status: None   Collection Time: 02/22/14  8:37 AM  Result Value Ref Range   Squamous Epithelial / LPF RARE RARE   WBC, UA 3-6 <3 WBC/hpf   RBC / HPF 0-2 <3 RBC/hpf   Bacteria, UA RARE RARE  Comprehensive metabolic panel     Status: Abnormal   Collection Time: 02/22/14 10:00 AM  Result Value Ref Range   Sodium 139 135 - 145 mmol/L   Potassium 3.1 (L) 3.5 - 5.1 mmol/L   Chloride 109 96 - 112 mmol/L   CO2 23 19 - 32 mmol/L   Glucose, Bld 251 (H) 70 - 99 mg/dL   BUN 13 6 - 23 mg/dL   Creatinine, Ser 0.79 0.50 - 1.10 mg/dL   Calcium 8.4 8.4 - 10.5 mg/dL   Total Protein 7.0 6.0 - 8.3 g/dL   Albumin 3.2 (L) 3.5 - 5.2 g/dL   AST 12 0 - 37 U/L  ALT 12 0 - 35 U/L   Alkaline Phosphatase 76 39 - 117 U/L   Total Bilirubin 0.4 0.3 - 1.2 mg/dL   GFR calc non Af Amer >90 >90 mL/min   GFR calc Af Amer >90 >90 mL/min    Comment: (NOTE) The eGFR has been calculated using the CKD EPI equation. This calculation has not been validated in all clinical situations. eGFR's persistently <90 mL/min signify possible Chronic Kidney Disease.    Anion gap 7 5 - 15  CBC with Differential/Platelet     Status: None   Collection Time: 02/22/14  5:23 PM  Result Value Ref Range   WBC 6.7 4.0 - 10.5 K/uL   RBC 4.00 3.87 - 5.11 MIL/uL   Hemoglobin 12.0 12.0 - 15.0 g/dL   HCT 37.0 36.0 - 46.0 %   MCV 92.5 78.0 - 100.0 fL   MCH 30.0 26.0 - 34.0 pg   MCHC 32.4 30.0 - 36.0 g/dL   RDW 13.4 11.5 - 15.5 %   Platelets 212 150 - 400 K/uL   Neutrophils Relative % 70 43 - 77 %   Neutro Abs 4.7 1.7 - 7.7 K/uL   Lymphocytes Relative 20 12 - 46 %   Lymphs Abs 1.3 0.7 - 4.0 K/uL   Monocytes Relative 9 3 - 12 %   Monocytes Absolute 0.6 0.1 - 1.0 K/uL   Eosinophils Relative 1 0 - 5 %   Eosinophils Absolute 0.1 0.0 - 0.7 K/uL   Basophils Relative 0 0 - 1 %   Basophils Absolute 0.0 0.0 - 0.1 K/uL    Ct Abdomen Pelvis W Contrast  02/22/2014   CLINICAL DATA:  Acute right lower quadrant pain for 3-4 days. Gross hematuria.  EXAM: CT ABDOMEN AND PELVIS WITH CONTRAST  TECHNIQUE: Multidetector CT imaging of the abdomen and pelvis was performed using the standard protocol following bolus administration of intravenous contrast.  CONTRAST:  13m OMNIPAQUE IOHEXOL 300 MG/ML SOLN, 1065mOMNIPAQUE IOHEXOL 300 MG/ML SOLN  COMPARISON:  CT scan of February 01, 2013.  FINDINGS: Minimal right pleural effusion is noted with adjacent subsegmental atelectasis. No significant osseous abnormality is noted.  No gallstones are noted. The liver, spleen and pancreas appear normal. Stable 2.5 cm right adrenal mass is noted consistent with adenoma. Bilateral nephrolithiasis is noted. No  hydronephrosis or renal obstruction is noted. Mild wall thickening of descending colon is noted with minimal surrounding inflammatory changes suggesting colitis. There is no evidence of bowel obstruction. No abnormal fluid collection is noted.  The appendix does appear to be mildly thickened proximally, measuring at least 12 mm, which is increased compared to prior exam. Appendicolith is also noted. Minimal inflammatory changes may be seen in the adjacent fat. Urinary bladder appears normal. No significant adenopathy is noted. Ovaries appear normal. Status post hysterectomy.  IMPRESSION: Minimal right pleural effusion is noted.  Bilateral nonobstructive nephrolithiasis is noted. No hydronephrosis or renal obstruction is noted.  Stable right adrenal adenoma.  Wall thickening and surrounding inflammatory changes are seen involving the descending colon suggesting colitis. This most likely is inflammatory or infectious in origin.  Mild thickening of the appendix is seen proximally, measuring 12 mm, which is increased compared to prior exam. Appendicolith is also now identified. There may be minimal inflammatory changes seen in the adjacent fat, and in the appropriate clinical setting, these findings would be suspicious for early appendicitis.   Electronically Signed   By: JaSabino Dick.D.   On: 02/22/2014 15:49  Review of Systems  Constitutional: Positive for malaise/fatigue. Negative for fever.  HENT: Negative.   Eyes: Negative.   Respiratory: Positive for shortness of breath.   Cardiovascular: Negative.   Gastrointestinal: Positive for nausea and abdominal pain. Negative for vomiting, diarrhea, constipation and blood in stool.  Genitourinary: Positive for hematuria and flank pain.  Musculoskeletal: Positive for myalgias and joint pain.  Skin: Negative.   Neurological: Positive for weakness.  Endo/Heme/Allergies: Positive for polydipsia.  Psychiatric/Behavioral: Positive for depression.   Blood  pressure 168/100, pulse 81, temperature 98.4 F (36.9 C), temperature source Oral, resp. rate 20, SpO2 100 %. Physical Exam  Constitutional: She is oriented to person, place, and time. She appears well-nourished.  HENT:  Head: Normocephalic and atraumatic.  Eyes: Conjunctivae are normal. No scleral icterus.  Neck: Normal range of motion. Neck supple.  Cardiovascular: Normal rate and regular rhythm.   Respiratory: Effort normal. She has no wheezes. She has no rales.  GI: Soft. She exhibits distension. There is tenderness in the right upper quadrant, right lower quadrant, left upper quadrant and left lower quadrant. There is guarding and CVA tenderness. There is no rigidity, no rebound, no tenderness at McBurney's point and negative Murphy's sign. No hernia. Hernia confirmed negative in the ventral area.    Musculoskeletal: Normal range of motion.  Neurological: She is alert and oriented to person, place, and time.  Skin: Skin is warm and dry.    Assessment/Plan: Abdominal pain   CT scan reviewed and compared to old studies.  Doubt appendicitis given duration of symptoms and CT findings which are borderline at best.  Her clinical exam does not support appendicitis either.  Colitis  Descending colon looks abnormal on CT.  WBC normal though.  Needs a colonoscopy at some point.  Will cover with cipro/ flagyl for now and follow. Keep NPO for  Tonight and reases in am.  Patient Active Problem List   Diagnosis Date Noted  . Type 2 diabetes, uncontrolled, with renal manifestation 01/21/2014  . Essential hypertension, benign 01/21/2014  . Chronic pain disorder 01/21/2014  . Migraine 09/07/2013  . DOE (dyspnea on exertion) 06/06/2013  . DM type 2, uncontrolled, with neuropathy 05/23/2013  . Mixed urge and stress incontinence 08/25/2011  . Ureteral stricture, right 08/25/2011  . Pyelonephritis 08/22/2011  . Nephrolithiasis 08/22/2011  . Incisional hernia 05/10/2011  . Obesity, Class III, BMI  40-49.9 (morbid obesity) 05/10/2011  . Tachycardia 05/08/2011  . DIABETES MELLITUS, TYPE II 09/30/2006  . BLINDNESS, [place], [place] DEFINITION 09/14/2006  . HYPERLIPIDEMIA 09/13/2006  . Anxiety and depression 09/13/2006  . HYPERTENSION 09/13/2006  . MYOCARDIAL INFARCTION, HX OF 09/13/2006  . GERD 09/13/2006  . SEIZURE DISORDER 09/13/2006  Does not need surgery at this point.    Recommend medical admission given multiple medical problems and management of colitis.    Jamerion Cabello A. 02/22/2014, 6:33 PM

## 2014-02-22 NOTE — ED Provider Notes (Signed)
CSN: [id number]     Arrival date & time 02/22/14  0751 History   First MD Initiated Contact with Patient 02/22/14 [phone]     Chief Complaint  Patient presents with  . blood in urine      (Consider location/radiation/quality/duration/timing/severity/associated sxs/prior Treatment) The history is provided by the patient.   49 year old female comes to the emergency department with concern for blood in the urine and bilateral lower quadrant abdominal pain that's gotten worse in the past 2 days. Patient has a history of chronic abdominal pain followed by pain management but this pain is worse. Associated with nausea but no vomiting no fevers. Patient's had a history of kidney stones in the past since had blood in her urine when that's occurred. Patient's belly pain is 8 out of 10.  Past Medical History  Diagnosis Date  . Hypertension   . Asthma   . Seizures   . Heart murmur     born with   . Dysrhythmia     skipped beats  . Myocardial infarction     hi of x 2  last one 2003   . COPD (chronic obstructive pulmonary disease)     chronic bronchitis   . Pneumonia     hx of 2009  . GERD (gastroesophageal reflux disease)   . Headache(784.0)     migraines   . Neuropathy     due to diabetes   . Dizziness     secondary to diabetes and hypertension   . Arthritis     joint pain   . Hx of blood clots     hematomas removed from left side of brain from 43mo to 89yrold   . Epilepsy idiopathic petit mal     last seizure 2012;controlled w/ topomax  . Diabetic neuropathy, painful     FEET AND HANDS  . Diabetes mellitus     since age [age]type 2 IDDM  . Sleep apnea     [facility];does not use Cpap ; mild  . MIGRAINE HEADACHE 09/14/2006    Qualifier: Diagnosis of  By: MaHassell DoneNP, NyTori Milks  . Glaucoma     NOT ON ANY EYE DROPS   . Legally blind     left eye   . Stroke     last 2003  RESIDUAL LEFT LEG WEAKNESS--NO OTHER RESIDUAL PROBLEMS  . CVA 11/14/2007    Qualifier:  Diagnosis of  By: MaHassell DoneNP, NyTori Milks  . Pain 09/23/11    LOWER BACK AND RIGHT SIDE--PT HAS RIGHT URETERAL STONE  . Nephrolithiasis     frequent urination , urination at nite  PT SEEN IN ER 09/22/11 FOR BACK AND RT SIDED PAIN--HAS KNOWN STONE RT URETER AND UA IN ER SHOWED UTI  . Anxiety   . Depression   . Stress incontinence   . Hyperlipidemia   . Pyelonephritis    Past Surgical History  Procedure Laterality Date  . Kidney stone removal    . Brain hematoma evacuation      five procedures total, first procedure when [age]onths old, last at [age]ears of age.  . Shunt removal      shunt inserted at age [age]emoved at age 49 . Eye surgery    . Retinal detachment surgery  1990  . Vaginal hysterectomy  1996  . Incisional hernia repair  05/05/2011    Procedure: LAPAROSCOPIC INCISIONAL HERNIA;  Surgeon: MaRolm BookbinderMD;  [facility]  Service: General;  Laterality: N/A;  . Mass excision  05/05/2011    Procedure: EXCISION MASS;  Surgeon: Rolm Bookbinder, MD;  Location: [facility];  Service: General;  Laterality: Right;  . Hernia repair  05/05/11    lap incisional hernia  . Pcnl    . Cystoscopy/retrograde/ureteroscopy  08/24/2011    Procedure: CYSTOSCOPY/RETROGRADE/URETEROSCOPY;  Surgeon: Molli Hazard, MD;  Location: [facility];  Service: Urology;  Laterality: Right;  Cysto, Right retrograde Pyelogram, right stent placement.   . Cystoscopy with ureteroscopy  09/24/2011    Procedure: CYSTOSCOPY WITH URETEROSCOPY;  Surgeon: Malka So, MD;  Location: [facility];  Service: Urology;  Laterality: Right;  Balloon dilation right ureter   . Cystoscopy w/ retrogrades  09/24/2011    Procedure: CYSTOSCOPY WITH RETROGRADE PYELOGRAM;  Surgeon: Malka So, MD;  Location: [facility];  Service: Urology;  Laterality: Right;   Family History  Problem Relation Age of Onset  . Cancer Mother     uterine  . Hypertension Mother   . Cancer Maternal Grandmother     brain  . Hypertension Maternal Grandmother   .  Anesthesia problems Neg Hx    History  Substance Use Topics  . Smoking status: Former Smoker -- 1.00 packs/day for 30 years    Types: Cigarettes    Quit date: 01/20/1987  . Smokeless tobacco: Never Used     Comment: quit more than 20years  . Alcohol Use: No   OB History    Gravida Para Term Preterm AB TAB SAB Ectopic Multiple Living   3 2 2  1  1   2      Review of Systems  Constitutional: Negative for fever.  HENT: Negative for congestion.   Eyes: Negative for redness.  Respiratory: Negative for shortness of breath.   Cardiovascular: Negative for chest pain.  Gastrointestinal: Positive for nausea and abdominal pain. Negative for vomiting and diarrhea.  Genitourinary: Positive for hematuria. Negative for dysuria.  Musculoskeletal: Negative for back pain.  Skin: Negative for rash.  Neurological: Negative for headaches.  Hematological: Does not bruise/bleed easily.  Psychiatric/Behavioral: Negative for confusion.      Allergies  Codeine and Lactose intolerance (gi)  Home Medications   Prior to Admission medications   Medication Sig Start Date End Date Taking? Authorizing Provider  baclofen (LIORESAL) 10 MG tablet TAKE 1 TABLET BY MOUTH THREE TIMES A DAY AS NEEDED FOR MUSCLE SPASMS 01/26/14  Yes Monica Manitou, DO  cetirizine (ZYRTEC) 10 MG tablet Take 10 mg by mouth 3 times/day as needed-between meals & bedtime for allergies. For allergies   Yes Historical Provider, MD  clopidogrel (PLAVIX) 75 MG tablet TAKE 1 TABLET BY MOUTH EVERY DAY 01/25/14  Yes Lauree Chandler, NP  EASY TOUCH PEN NEEDLES 31G X 8 MM MISC USE AS DIRECTED TO INJECT INSULIN DAILY 01/25/14  Yes Lauree Chandler, NP  FLUoxetine (PROZAC) 20 MG capsule Take 3 capsules (60 mg total) by mouth daily at 12 noon. 12/29/13  Yes Norma Fredrickson, MD  fluticasone (FLONASE) 50 MCG/ACT nasal spray Place 2 sprays into both nostrils daily.   Yes Historical Provider, MD  gabapentin (NEURONTIN) 300 MG capsule TAKE 2  CAPSULES BY MOUTH THREE TIMES A DAY 11/09/13  Yes Tiffany L Reed, DO  ibuprofen (ADVIL,MOTRIN) 600 MG tablet Take one tablet by mouth every 8 hours as needed for pain Patient taking differently: Take 600 mg by mouth every 8 (eight) hours.  02/15/14  Yes Lauree Chandler, NP  Insulin Glargine (TOUJEO SOLOSTAR) 300 UNIT/ML SOPN Inject 90 Units into the skin 2 (two) times daily. 02/05/14  Yes Tivis Ringer, RPH-CPP  Insulin Lispro, Human, (HUMALOG KWIKPEN) 200 UNIT/ML SOPN Inject 30 Units into the skin 3 (three) times daily with meals. (if blood sugar >300, give 35 units) 12/25/13  Yes Tivis Ringer, RPH-CPP  lisinopril (PRINIVIL,ZESTRIL) 40 MG tablet Take 1 tablet (40 mg total) by mouth daily. 01/16/14  Yes Blanchie Serve, MD  ONE TOUCH ULTRA TEST test strip USE TO CHECK BLOOD SUGAR FOUR TIMES A DAY 12/04/13  Yes Hennie Duos, MD  [facility] DELICA LANCETS 57D MISC TEST BLOOD SUGAR FOUR TIMES A DAY 01/25/14  Yes Lauree Chandler, NP  pantoprazole (PROTONIX) 40 MG tablet TAKE 1 TABLET BY MOUTH DAILY 12/04/13  Yes Lauree Chandler, NP  PROAIR HFA 108 (90 BASE) MCG/ACT inhaler INHALE 2 PUFFS BY MOUTH EVERY 6 HOURS AS NEEDED FOR WHEEZING OR SHORTNESS OF BREATH 01/25/14  Yes Lauree Chandler, NP  ranitidine (ZANTAC) 150 MG tablet TAKE 1 TABLET BY MOUTH TWICE DAILY 12/04/13  Yes Lauree Chandler, NP  simvastatin (ZOCOR) 20 MG tablet TAKE 1 TABLET BY MOUTH EVERY MORNING FOR CHOLESTROL 01/02/14  Yes Estill Dooms, MD  solifenacin (VESICARE) 10 MG tablet Take 1 tablet (10 mg total) by mouth daily. 10/02/13  Yes Tivis Ringer, RPH-CPP  SYMBICORT 160-4.5 MCG/ACT inhaler INHALE 2 PUFFS BY MOUTH TWICE DAILY 01/25/14  Yes Lauree Chandler, NP  topiramate (TOPAMAX) 200 MG tablet TAKE 1 TABLET BY MOUTH TWICE DAILY 11/09/13  Yes Tiffany L Reed, DO  traZODone (DESYREL) 150 MG tablet Take 1 tablet (150 mg total) by mouth at bedtime. 2 qhs Patient taking differently: Take 300 mg by mouth at bedtime.  12/29/13  Yes Norma Fredrickson, MD  verapamil (VERELAN PM) 240 MG 24 hr capsule TAKE 1 CAPSULE BY MOUTH EVERY NIGHT AT BEDTIME FOR BLOOD PRESSURE 11/09/13  Yes Tiffany L Reed, DO  Vitamin D, Ergocalciferol, (DRISDOL) 50000 UNITS CAPS capsule Take 50,000 Units by mouth every Thursday.   Yes Historical Provider, MD  zolpidem (AMBIEN) 10 MG tablet Take 1 tablet (10 mg total) by mouth at bedtime as needed for sleep. 12/29/13  Yes Norma Fredrickson, MD  amoxicillin (AMOXIL) 500 MG capsule Take 1 capsule by mouth 2 (two) times daily. 01/18/14   Historical Provider, MD  Tdap (BOOSTRIX) 5-2.5-18.5 LF-MCG/0.5 injection Inject 0.5 mLs into the muscle once. Patient not taking: Reported on 02/22/2014 10/30/13   Tivis Ringer, RPH-CPP   BP 143/87 mmHg  Pulse 74  Temp(Src) 98.7 F (37.1 C) (Oral)  Resp 20  SpO2 100% Physical Exam  Constitutional: She is oriented to person, place, and time. She appears well-developed and well-nourished. No distress.  HENT:  Head: Normocephalic and atraumatic.  Mouth/Throat: Oropharynx is clear and moist.  Eyes: Conjunctivae and EOM are normal. Pupils are equal, round, and reactive to light.  Neck: Normal range of motion.  Cardiovascular: Normal rate and regular rhythm.   No murmur heard. Abdominal: Soft. Bowel sounds are normal. There is tenderness.  Tender to palpation both lower quadrants.  Musculoskeletal: Normal range of motion.  Neurological: She is alert and oriented to person, place, and time. No cranial nerve deficit. She exhibits normal muscle tone. Coordination normal.  Skin: Skin is warm. No rash noted.  Nursing note and vitals reviewed.   ED Course  Procedures (including critical care time) Labs Review Labs Reviewed  URINALYSIS, ROUTINE W REFLEX MICROSCOPIC - Abnormal; Notable  for the following:    Specific Gravity, Urine 1.041 (*)    Glucose, UA >1000 (*)    Hgb urine dipstick TRACE (*)    Protein, ur 30 (*)    All other components within normal limits  COMPREHENSIVE  METABOLIC PANEL - Abnormal; Notable for the following:    Potassium 3.1 (*)    Glucose, Bld 251 (*)    Albumin 3.2 (*)    All other components within normal limits  URINE MICROSCOPIC-ADD ON  CBC WITH DIFFERENTIAL/PLATELET  CBC WITH DIFFERENTIAL/PLATELET   Results for orders placed or performed during the hospital encounter of 02/22/14  Urinalysis, Routine w reflex microscopic  Result Value Ref Range   Color, Urine YELLOW YELLOW   APPearance CLEAR CLEAR   Specific Gravity, Urine 1.041 (H) 1.005 - 1.030   pH 5.5 5.0 - 8.0   Glucose, UA >1000 (A) NEGATIVE mg/dL   Hgb urine dipstick TRACE (A) NEGATIVE   Bilirubin Urine NEGATIVE NEGATIVE   Ketones, ur NEGATIVE NEGATIVE mg/dL   Protein, ur 30 (A) NEGATIVE mg/dL   Urobilinogen, UA 0.2 0.0 - 1.0 mg/dL   Nitrite NEGATIVE NEGATIVE   Leukocytes, UA NEGATIVE NEGATIVE  Urine microscopic-add on  Result Value Ref Range   Squamous Epithelial / LPF RARE RARE   WBC, UA 3-6 <3 WBC/hpf   RBC / HPF 0-2 <3 RBC/hpf   Bacteria, UA RARE RARE  Comprehensive metabolic panel  Result Value Ref Range   Sodium 139 135 - 145 mmol/L   Potassium 3.1 (L) 3.5 - 5.1 mmol/L   Chloride 109 96 - 112 mmol/L   CO2 23 19 - 32 mmol/L   Glucose, Bld 251 (H) 70 - 99 mg/dL   BUN 13 6 - 23 mg/dL   Creatinine, Ser 0.79 0.50 - 1.10 mg/dL   Calcium 8.4 8.4 - 10.5 mg/dL   Total Protein 7.0 6.0 - 8.3 g/dL   Albumin 3.2 (L) 3.5 - 5.2 g/dL   AST 12 0 - 37 U/L   ALT 12 0 - 35 U/L   Alkaline Phosphatase 76 39 - 117 U/L   Total Bilirubin 0.4 0.3 - 1.2 mg/dL   GFR calc non Af Amer >90 >90 mL/min   GFR calc Af Amer >90 >90 mL/min   Anion gap 7 5 - 15     Imaging Review Ct Abdomen Pelvis W Contrast  02/22/2014   CLINICAL DATA:  Acute right lower quadrant pain for 3-4 days. Gross hematuria.  EXAM: CT ABDOMEN AND PELVIS WITH CONTRAST  TECHNIQUE: Multidetector CT imaging of the abdomen and pelvis was performed using the standard protocol following bolus administration of  intravenous contrast.  CONTRAST:  56m OMNIPAQUE IOHEXOL 300 MG/ML SOLN, 1019mOMNIPAQUE IOHEXOL 300 MG/ML SOLN  COMPARISON:  CT scan of February 01, 2013.  FINDINGS: Minimal right pleural effusion is noted with adjacent subsegmental atelectasis. No significant osseous abnormality is noted.  No gallstones are noted. The liver, spleen and pancreas appear normal. Stable 2.5 cm right adrenal mass is noted consistent with adenoma. Bilateral nephrolithiasis is noted. No hydronephrosis or renal obstruction is noted. Mild wall thickening of descending colon is noted with minimal surrounding inflammatory changes suggesting colitis. There is no evidence of bowel obstruction. No abnormal fluid collection is noted.  The appendix does appear to be mildly thickened proximally, measuring at least 12 mm, which is increased compared to prior exam. Appendicolith is also noted. Minimal inflammatory changes may be seen in the adjacent fat. Urinary bladder appears normal.  No significant adenopathy is noted. Ovaries appear normal. Status post hysterectomy.  IMPRESSION: Minimal right pleural effusion is noted.  Bilateral nonobstructive nephrolithiasis is noted. No hydronephrosis or renal obstruction is noted.  Stable right adrenal adenoma.  Wall thickening and surrounding inflammatory changes are seen involving the descending colon suggesting colitis. This most likely is inflammatory or infectious in origin.  Mild thickening of the appendix is seen proximally, measuring 12 mm, which is increased compared to prior exam. Appendicolith is also now identified. There may be minimal inflammatory changes seen in the adjacent fat, and in the appropriate clinical setting, these findings would be suspicious for early appendicitis.   Electronically Signed   By: Sabino Dick M.D.   On: 02/22/2014 15:49     EKG Interpretation None      MDM   Final diagnoses:  Abdominal pain    CT scan raises concerns for possible appendicitis. Patient  has chronic lower abdominal pain. Followed by pain management however she does state that it has been worse in the past 2-3 days. Been unable to localize it to the right side. Patient came in for which she thought was blood in her urine. That's negative here no evidence of urinary tract infection. Patient's CBC is still pending so white count is not known. Urinalysis not consistent with urinary tract infection. Liver function tests without significant abnormalities. Patient without fever. Patient with nausea but no vomiting.  CT also raise some concerns for colitis. Patient without any diarrhea. Colitis could be treated symptomatically. General surgery will be consult for the concern for appendicitis. Clinically this possible based on the fact that the patient's pain in the lower quadrants has been worse in the past 2 days.  Fredia Sorrow, MD 02/22/14 1725

## 2014-02-22 NOTE — H&P (Signed)
[facility] History and Physical  Patient: [name]  MRN: [id number]  DOB: November 24, 1965  DOS: the patient was seen and examined on 02/22/2014 PCP: Lauree Chandler, NP  Chief Complaint: Abdominal pain and blood on undergarments  HPI: [name] is a 49 y.o. female with Past medical history of hypertension, COPD, coronary artery disease, GERD, morbid obesity, diabetes mellitus with neuropathy as well as nephropathy, recurrent UTI. The patient is presenting with complaints of abdominal pain. The pain has been ongoing for last 2 days and is located on her upper abdomen as well as on the right side. She has a history of chronic incontinence and is wearing pull-ups on a daily basis. Earlier this morning they have noted some blood on her pull-ups. This suspected that this was secondary to urine and therefore she was brought to the ER. Patient denies any complaint of fever or chills. Patient had an episode of nausea with dry heaving earlier this morning but no active vomiting or blood. Denies any dizziness lightheadedness. Patient's last antibiotic use was in December. Denies any chest pain shortness of breath. Denies any prior history of blood in her stool. Has not had any colonoscopy as of yet.  The patient is coming from home. And at her baseline independent for most of her ADL.  Review of Systems: as mentioned in the history of present illness.  A Comprehensive review of the other systems is negative.  Past Medical History  Diagnosis Date  . Hypertension   . Asthma   . Seizures   . Heart murmur     born with   . Dysrhythmia     skipped beats  . Myocardial infarction     hi of x 2  last one 2003   . COPD (chronic obstructive pulmonary disease)     chronic bronchitis   . Pneumonia     hx of 2009  . GERD (gastroesophageal reflux disease)   . Headache(784.0)     migraines   . Neuropathy     due to diabetes   . Dizziness     secondary to diabetes and  hypertension   . Arthritis     joint pain   . Hx of blood clots     hematomas removed from left side of brain from 33mo to 89yrold   . Epilepsy idiopathic petit mal     last seizure 2012;controlled w/ topomax  . Diabetic neuropathy, painful     FEET AND HANDS  . Diabetes mellitus     since age [age]type 2 IDDM  . Sleep apnea     sleep study 2010 @ [facility];does not use Cpap ; mild  . MIGRAINE HEADACHE 09/14/2006    Qualifier: Diagnosis of  By: MaHassell DoneNP, NyTori Milks  . Glaucoma     NOT ON ANY EYE DROPS   . Legally blind     left eye   . Stroke     last 2003  RESIDUAL LEFT LEG WEAKNESS--NO OTHER RESIDUAL PROBLEMS  . CVA 11/14/2007    Qualifier: Diagnosis of  By: MaHassell DoneNP, NyTori Milks  . Pain 09/23/11    LOWER BACK AND RIGHT SIDE--PT HAS RIGHT URETERAL STONE  . Nephrolithiasis     frequent urination , urination at nite  PT SEEN IN ER 09/22/11 FOR BACK AND RT SIDED PAIN--HAS KNOWN STONE RT URETER AND UA IN ER SHOWED UTI  . Anxiety   . Depression   . Stress incontinence   .  Hyperlipidemia   . Pyelonephritis    Past Surgical History  Procedure Laterality Date  . Kidney stone removal    . Brain hematoma evacuation      five procedures total, first procedure when 52 months old, last at 49 years of age.  . Shunt removal      shunt inserted at age 72 removed at age 43   . Eye surgery    . Retinal detachment surgery  1990  . Vaginal hysterectomy  1996  . Incisional hernia repair  05/05/2011    Procedure: LAPAROSCOPIC INCISIONAL HERNIA;  Surgeon: Rolm Bookbinder, MD;  Location: [place];  Service: General;  Laterality: N/A;  . Mass excision  05/05/2011    Procedure: EXCISION MASS;  Surgeon: Rolm Bookbinder, MD;  Location: [place];  Service: General;  Laterality: Right;  . Hernia repair  05/05/11    lap incisional hernia  . Pcnl    . Cystoscopy/retrograde/ureteroscopy  08/24/2011    Procedure: CYSTOSCOPY/RETROGRADE/URETEROSCOPY;  Surgeon: Molli Hazard, MD;  Location: [facility];   Service: Urology;  Laterality: Right;  Cysto, Right retrograde Pyelogram, right stent placement.   . Cystoscopy with ureteroscopy  09/24/2011    Procedure: CYSTOSCOPY WITH URETEROSCOPY;  Surgeon: Malka So, MD;  Location: [facility];  Service: Urology;  Laterality: Right;  Balloon dilation right ureter   . Cystoscopy w/ retrogrades  09/24/2011    Procedure: CYSTOSCOPY WITH RETROGRADE PYELOGRAM;  Surgeon: Malka So, MD;  Location: [facility];  Service: Urology;  Laterality: Right;   Social History:  reports that she quit smoking about 27 years ago. Her smoking use included Cigarettes. She has a 30 pack-year smoking history. She has never used smokeless tobacco. She reports that she does not drink alcohol or use illicit drugs.  Allergies  Allergen Reactions  . Codeine Swelling and Other (See Comments)    Swelling and burning of mouth (inside)  . Lactose Intolerance (Gi) Nausea And Vomiting    Family History  Problem Relation Age of Onset  . Cancer Mother     uterine  . Hypertension Mother   . Cancer Maternal Grandmother     brain  . Hypertension Maternal Grandmother   . Anesthesia problems Neg Hx     Prior to Admission medications   Medication Sig Start Date End Date Taking? Authorizing Provider  baclofen (LIORESAL) 10 MG tablet TAKE 1 TABLET BY MOUTH THREE TIMES A DAY AS NEEDED FOR MUSCLE SPASMS 01/26/14  Yes Monica Hinsdale, DO  cetirizine (ZYRTEC) 10 MG tablet Take 10 mg by mouth 3 times/day as needed-between meals & bedtime for allergies. For allergies   Yes Historical Provider, MD  clopidogrel (PLAVIX) 75 MG tablet TAKE 1 TABLET BY MOUTH EVERY DAY 01/25/14  Yes Lauree Chandler, NP  EASY TOUCH PEN NEEDLES 31G X 8 MM MISC USE AS DIRECTED TO INJECT INSULIN DAILY 01/25/14  Yes Lauree Chandler, NP  FLUoxetine (PROZAC) 20 MG capsule Take 3 capsules (60 mg total) by mouth daily at 12 noon. 12/29/13  Yes Norma Fredrickson, MD  fluticasone (FLONASE) 50 MCG/ACT nasal spray Place 2 sprays  into both nostrils daily.   Yes Historical Provider, MD  gabapentin (NEURONTIN) 300 MG capsule TAKE 2 CAPSULES BY MOUTH THREE TIMES A DAY 11/09/13  Yes Tiffany L Reed, DO  ibuprofen (ADVIL,MOTRIN) 600 MG tablet Take one tablet by mouth every 8 hours as needed for pain Patient taking differently: Take 600 mg by mouth every 8 (eight) hours.  02/15/14  Yes Lauree Chandler, NP  Insulin Glargine (TOUJEO SOLOSTAR) 300 UNIT/ML SOPN Inject 90 Units into the skin 2 (two) times daily. 02/05/14  Yes Tivis Ringer, RPH-CPP  Insulin Lispro, Human, (HUMALOG KWIKPEN) 200 UNIT/ML SOPN Inject 30 Units into the skin 3 (three) times daily with meals. (if blood sugar >300, give 35 units) 12/25/13  Yes Tivis Ringer, RPH-CPP  lisinopril (PRINIVIL,ZESTRIL) 40 MG tablet Take 1 tablet (40 mg total) by mouth daily. 01/16/14  Yes Blanchie Serve, MD  ONE TOUCH ULTRA TEST test strip USE TO CHECK BLOOD SUGAR FOUR TIMES A DAY 12/04/13  Yes Hennie Duos, MD  [facility] DELICA LANCETS 61P MISC TEST BLOOD SUGAR FOUR TIMES A DAY 01/25/14  Yes Lauree Chandler, NP  pantoprazole (PROTONIX) 40 MG tablet TAKE 1 TABLET BY MOUTH DAILY 12/04/13  Yes Lauree Chandler, NP  PROAIR HFA 108 (90 BASE) MCG/ACT inhaler INHALE 2 PUFFS BY MOUTH EVERY 6 HOURS AS NEEDED FOR WHEEZING OR SHORTNESS OF BREATH 01/25/14  Yes Lauree Chandler, NP  ranitidine (ZANTAC) 150 MG tablet TAKE 1 TABLET BY MOUTH TWICE DAILY 12/04/13  Yes Lauree Chandler, NP  simvastatin (ZOCOR) 20 MG tablet TAKE 1 TABLET BY MOUTH EVERY MORNING FOR CHOLESTROL 01/02/14  Yes Estill Dooms, MD  solifenacin (VESICARE) 10 MG tablet Take 1 tablet (10 mg total) by mouth daily. 10/02/13  Yes Tivis Ringer, RPH-CPP  SYMBICORT 160-4.5 MCG/ACT inhaler INHALE 2 PUFFS BY MOUTH TWICE DAILY 01/25/14  Yes Lauree Chandler, NP  topiramate (TOPAMAX) 200 MG tablet TAKE 1 TABLET BY MOUTH TWICE DAILY 11/09/13  Yes Tiffany L Reed, DO  traZODone (DESYREL) 150 MG tablet Take 1 tablet (150 mg total) by mouth  at bedtime. 2 qhs Patient taking differently: Take 300 mg by mouth at bedtime.  12/29/13  Yes Norma Fredrickson, MD  verapamil (VERELAN PM) 240 MG 24 hr capsule TAKE 1 CAPSULE BY MOUTH EVERY NIGHT AT BEDTIME FOR BLOOD PRESSURE 11/09/13  Yes Tiffany L Reed, DO  Vitamin D, Ergocalciferol, (DRISDOL) 50000 UNITS CAPS capsule Take 50,000 Units by mouth every Thursday.   Yes Historical Provider, MD  zolpidem (AMBIEN) 10 MG tablet Take 1 tablet (10 mg total) by mouth at bedtime as needed for sleep. 12/29/13  Yes Norma Fredrickson, MD  amoxicillin (AMOXIL) 500 MG capsule Take 1 capsule by mouth 2 (two) times daily. 01/18/14   Historical Provider, MD  Tdap (BOOSTRIX) 5-2.5-18.5 LF-MCG/0.5 injection Inject 0.5 mLs into the muscle once. Patient not taking: Reported on 02/22/2014 10/30/13   Tivis Ringer, RPH-CPP    Physical Exam: Filed Vitals:   02/22/14 1512 02/22/14 1810 02/22/14 2010 02/22/14 2146  BP: 143/87 168/100 150/91   Pulse: 74 81 83   Temp: 98.7 F (37.1 C) 98.4 F (36.9 C) 98.3 F (36.8 C)   TempSrc: Oral Oral Oral   Resp: 20 20 20    SpO2: 100% 100% 100% 99%    General: Alert, Awake and Oriented to Time, Place and Person. Appear in mild distress Eyes: PERRL ENT: Oral Mucosa clear moist. Neck: no JVD Cardiovascular: S1 and S2 Present, no Murmur, Peripheral Pulses Present Respiratory: Bilateral Air entry equal and Decreased, Clear to Auscultation, noCrackles, no wheezes Abdomen: Bowel Sound present, sluggish, Soft and mild tender Skin: no Rash Extremities: Trace Pedal edema, no calf tenderness Neurologic: Grossly no focal neuro deficit.  Labs on Admission:  CBC:  Recent Labs Lab 02/22/14 1723  WBC 6.7  NEUTROABS 4.7  HGB 12.0  HCT 37.0  MCV  92.5  PLT 212    CMP     Component Value Date/Time   NA 139 02/22/2014 1000   NA 136 12/25/2013 0911   K 3.1* 02/22/2014 1000   CL 109 02/22/2014 1000   CO2 23 02/22/2014 1000   GLUCOSE 251* 02/22/2014 1000   GLUCOSE 273*  12/25/2013 0911   BUN 13 02/22/2014 1000   BUN 15 12/25/2013 0911   CREATININE 0.79 02/22/2014 1000   CREATININE 0.89 10/07/2012 1745   CALCIUM 8.4 02/22/2014 1000   PROT 7.0 02/22/2014 1000   PROT 6.9 12/25/2013 0911   ALBUMIN 3.2* 02/22/2014 1000   AST 12 02/22/2014 1000   ALT 12 02/22/2014 1000   ALKPHOS 76 02/22/2014 1000   BILITOT 0.4 02/22/2014 1000   GFRNONAA >90 02/22/2014 1000   GFRAA >90 02/22/2014 1000    No results for input(s): LIPASE, AMYLASE in the last 168 hours.  No results for input(s): CKTOTAL, CKMB, CKMBINDEX, TROPONINI in the last 168 hours. BNP (last 3 results) No results for input(s): BNP in the last 8760 hours.  ProBNP (last 3 results)  Recent Labs  06/16/13 1524  PROBNP 7.7     Radiological Exams on Admission: Ct Abdomen Pelvis W Contrast  02/22/2014   CLINICAL DATA:  Acute right lower quadrant pain for 3-4 days. Gross hematuria.  EXAM: CT ABDOMEN AND PELVIS WITH CONTRAST  TECHNIQUE: Multidetector CT imaging of the abdomen and pelvis was performed using the standard protocol following bolus administration of intravenous contrast.  CONTRAST:  80m OMNIPAQUE IOHEXOL 300 MG/ML SOLN, 1088mOMNIPAQUE IOHEXOL 300 MG/ML SOLN  COMPARISON:  CT scan of February 01, 2013.  FINDINGS: Minimal right pleural effusion is noted with adjacent subsegmental atelectasis. No significant osseous abnormality is noted.  No gallstones are noted. The liver, spleen and pancreas appear normal. Stable 2.5 cm right adrenal mass is noted consistent with adenoma. Bilateral nephrolithiasis is noted. No hydronephrosis or renal obstruction is noted. Mild wall thickening of descending colon is noted with minimal surrounding inflammatory changes suggesting colitis. There is no evidence of bowel obstruction. No abnormal fluid collection is noted.  The appendix does appear to be mildly thickened proximally, measuring at least 12 mm, which is increased compared to prior exam. Appendicolith is also  noted. Minimal inflammatory changes may be seen in the adjacent fat. Urinary bladder appears normal. No significant adenopathy is noted. Ovaries appear normal. Status post hysterectomy.  IMPRESSION: Minimal right pleural effusion is noted.  Bilateral nonobstructive nephrolithiasis is noted. No hydronephrosis or renal obstruction is noted.  Stable right adrenal adenoma.  Wall thickening and surrounding inflammatory changes are seen involving the descending colon suggesting colitis. This most likely is inflammatory or infectious in origin.  Mild thickening of the appendix is seen proximally, measuring 12 mm, which is increased compared to prior exam. Appendicolith is also now identified. There may be minimal inflammatory changes seen in the adjacent fat, and in the appropriate clinical setting, these findings would be suspicious for early appendicitis.   Electronically Signed   By: JaSabino Dick.D.   On: 02/22/2014 15:49    Assessment/Plan Principal Problem:   Colitis Active Problems:   GERD   Obesity, Class III, BMI 40-49.9 (morbid obesity)   DM type 2, uncontrolled, with neuropathy   Essential hypertension, benign   Chronic pain disorder   1. Colitis Abnormal appendix on CT scan. The patient presented with complaints of abdominal pain with possible blood on her undergarments. Patient does not have any diarrhea patient  does not have any vomiting. Does not have any fever nor has any leukocytosis. Due to her complaint of severe abdominal tenderness a CT scan was performed which was showing evidence of colitis as well as possible appendicitis. Gen. surgery was initially consulted who does not think the patient appears to have any ongoing significant appendicitis requiring surgical correction. They recommended the patient to be absorbed in the hospital with antibiotics. The patient will be placed in hospital on med surge. We will continue with Cipro and Flagyl. Check ESR CRP C. difficile. Entry  precautions. Gentle IV hydration and reevaluate in morning due to presence of lower extremity edema.  2. Diabetes mellitus. Reducing patient's Lantus from 90 units twice daily to 45 units twice daily since the patient will remain nothing by mouth. Sliding scale. Also holding patient's scheduled pre-meal insulin.  3. Coronary artery disease. Continuing Plavix.  4. Essential hypertension. Blood patient at present stable continue home medications.  Advance goals of care discussion: Full code   Consults: Gen. surgery  DVT Prophylaxis: subcutaneous Heparin Nutrition: Nothing by mouth except medication  Disposition: Admitted to observation in med-surge unit.  Author: Berle Mull, MD [facility] Pager: [phone] 02/22/2014, 11:44 PM    If 7PM-7AM, please contact night-coverage [email or web address] Password [email or web address]

## 2014-02-22 NOTE — ED Notes (Signed)
Pt states that she woke up this morning and went to change her pull up and had bright red blood in it.  Pt states that she has lower back pain, abd pain that has been going on for couple of days. Pt has no blood in her pull up at this time.

## 2014-02-22 NOTE — ED Provider Notes (Signed)
Patient here for evaluation of acute on chronic abdominal pain. CT abdomen with evidence of colitis, question appendicitis. Patient has been evaluated by Dr. Brantley Stage with General Surgery in the Emergency Department, he will follow in consult. He recommends admission to medicine for colitis.  Quintella Reichert, MD 02/22/14 [phone]

## 2014-02-22 NOTE — Progress Notes (Signed)
ANTIBIOTIC CONSULT NOTE - INITIAL  Pharmacy Consult for cipro Indication: Intra-abdominal Infection  Allergies  Allergen Reactions  . Codeine Swelling and Other (See Comments)    Swelling and burning of mouth (inside)  . Lactose Intolerance (Gi) Nausea And Vomiting   Weight: 103.9kg   Vital Signs: Temp: 98.3 F (36.8 C) (02/04 2010) Temp Source: Oral (02/04 2010) BP: 150/91 mmHg (02/04 2010) Pulse Rate: 83 (02/04 2010) Intake/Output from previous day:   Intake/Output from this shift:    Labs:  Recent Labs  02/22/14 1000 02/22/14 1723  WBC  --  6.7  HGB  --  12.0  PLT  --  212  CREATININE 0.79  --    CrCl cannot be calculated (Unknown ideal weight.). No results for input(s): VANCOTROUGH, VANCOPEAK, VANCORANDOM, GENTTROUGH, GENTPEAK, GENTRANDOM, TOBRATROUGH, TOBRAPEAK, TOBRARND, AMIKACINPEAK, AMIKACINTROU, AMIKACIN in the last 72 hours.   Microbiology: No results found for this or any previous visit (from the past 720 hour(s)).  Medical History: Past Medical History  Diagnosis Date  . Hypertension   . Asthma   . Seizures   . Heart murmur     born with   . Dysrhythmia     skipped beats  . Myocardial infarction     hi of x 2  last one 2003   . COPD (chronic obstructive pulmonary disease)     chronic bronchitis   . Pneumonia     hx of 2009  . GERD (gastroesophageal reflux disease)   . Headache(784.0)     migraines   . Neuropathy     due to diabetes   . Dizziness     secondary to diabetes and hypertension   . Arthritis     joint pain   . Hx of blood clots     hematomas removed from left side of brain from 41mo to 89yrold   . Epilepsy idiopathic petit mal     last seizure 2012;controlled w/ topomax  . Diabetic neuropathy, painful     FEET AND HANDS  . Diabetes mellitus     since age [age]type 2 IDDM  . Sleep apnea     sleep study 2010 @ [facility];does not use Cpap ; mild  . MIGRAINE HEADACHE 09/14/2006    Qualifier: Diagnosis of  By: MaHassell DoneNP,  NyTori Milks  . Glaucoma     NOT ON ANY EYE DROPS   . Legally blind     left eye   . Stroke     last 2003  RESIDUAL LEFT LEG WEAKNESS--NO OTHER RESIDUAL PROBLEMS  . CVA 11/14/2007    Qualifier: Diagnosis of  By: MaHassell DoneNP, NyTori Milks  . Pain 09/23/11    LOWER BACK AND RIGHT SIDE--PT HAS RIGHT URETERAL STONE  . Nephrolithiasis     frequent urination , urination at nite  PT SEEN IN ER 09/22/11 FOR BACK AND RT SIDED PAIN--HAS KNOWN STONE RT URETER AND UA IN ER SHOWED UTI  . Anxiety   . Depression   . Stress incontinence   . Hyperlipidemia   . Pyelonephritis     Medications:  Scheduled:  . baclofen  10 mg Oral TID  . budesonide-formoterol  2 puff Inhalation BID  . ciprofloxacin  400 mg Intravenous Q12H  . [START ON 02/23/2014] clopidogrel  75 mg Oral Daily  . [START ON 02/23/2014] darifenacin  15 mg Oral Daily  . enoxaparin (LOVENOX) injection  40 mg Subcutaneous Q24H  . [START ON 02/23/2014] famotidine  20 mg  Oral Daily  . [START ON 02/23/2014] FLUoxetine  60 mg Oral Daily  . [START ON 02/23/2014] fluticasone  2 spray Each Nare Daily  . gabapentin  300 mg Oral TID  . insulin aspart  0-15 Units Subcutaneous Q6H  . insulin glargine  45 Units Subcutaneous Q12H  . [START ON 02/23/2014] lisinopril  40 mg Oral Daily  . metronidazole  500 mg Intravenous Q8H  . [START ON 02/23/2014] metronidazole  500 mg Intravenous Q8H  . potassium chloride  40 mEq Oral Once  . potassium chloride  10 mEq Intravenous Q1 Hr x 2  . [START ON 02/23/2014] simvastatin  20 mg Oral q1800  . topiramate  200 mg Oral BID  . traZODone  150 mg Oral QHS  . verapamil  240 mg Oral QHS   Assessment: 49 y.o. female Patient here for evaluation of acute on chronic abdominal pain. CT abdomen with evidence of colitis, question appendicitis.  Pharmacy consulted to dose cipro for intra-abdominal infection.  Scr 0.79, CrCl >128ms/min  Goal of Therapy:  cipro per renal function  Plan:  Continue Cipro 4022mIV q12h as ordered by Dr.  CoBrantley Stageharmacy will sign off  ElDolly RiasPh 02/22/2014, 9:03 PM Pager [phone]

## 2014-02-22 NOTE — ED Notes (Signed)
Attempted to call report, RN unavailable.

## 2014-02-23 ENCOUNTER — Encounter: Payer: Self-pay | Admitting: Physical Therapy

## 2014-02-23 DIAGNOSIS — G4733 Obstructive sleep apnea (adult) (pediatric): Secondary | ICD-10-CM | POA: Diagnosis present

## 2014-02-23 DIAGNOSIS — G40409 Other generalized epilepsy and epileptic syndromes, not intractable, without status epilepticus: Secondary | ICD-10-CM | POA: Diagnosis present

## 2014-02-23 DIAGNOSIS — R1010 Upper abdominal pain, unspecified: Secondary | ICD-10-CM | POA: Diagnosis present

## 2014-02-23 DIAGNOSIS — I10 Essential (primary) hypertension: Secondary | ICD-10-CM | POA: Diagnosis present

## 2014-02-23 DIAGNOSIS — Z8673 Personal history of transient ischemic attack (TIA), and cerebral infarction without residual deficits: Secondary | ICD-10-CM | POA: Diagnosis not present

## 2014-02-23 DIAGNOSIS — J449 Chronic obstructive pulmonary disease, unspecified: Secondary | ICD-10-CM | POA: Diagnosis present

## 2014-02-23 DIAGNOSIS — Z79899 Other long term (current) drug therapy: Secondary | ICD-10-CM | POA: Diagnosis not present

## 2014-02-23 DIAGNOSIS — E785 Hyperlipidemia, unspecified: Secondary | ICD-10-CM | POA: Diagnosis present

## 2014-02-23 DIAGNOSIS — G8929 Other chronic pain: Secondary | ICD-10-CM | POA: Diagnosis present

## 2014-02-23 DIAGNOSIS — Z90710 Acquired absence of both cervix and uterus: Secondary | ICD-10-CM | POA: Diagnosis not present

## 2014-02-23 DIAGNOSIS — M1990 Unspecified osteoarthritis, unspecified site: Secondary | ICD-10-CM | POA: Diagnosis present

## 2014-02-23 DIAGNOSIS — Z791 Long term (current) use of non-steroidal anti-inflammatories (NSAID): Secondary | ICD-10-CM | POA: Diagnosis not present

## 2014-02-23 DIAGNOSIS — I252 Old myocardial infarction: Secondary | ICD-10-CM | POA: Diagnosis not present

## 2014-02-23 DIAGNOSIS — Z8059 Family history of malignant neoplasm of other urinary tract organ: Secondary | ICD-10-CM | POA: Diagnosis not present

## 2014-02-23 DIAGNOSIS — Z8049 Family history of malignant neoplasm of other genital organs: Secondary | ICD-10-CM | POA: Diagnosis not present

## 2014-02-23 DIAGNOSIS — I2510 Atherosclerotic heart disease of native coronary artery without angina pectoris: Secondary | ICD-10-CM | POA: Diagnosis present

## 2014-02-23 DIAGNOSIS — Z87440 Personal history of urinary (tract) infections: Secondary | ICD-10-CM | POA: Diagnosis not present

## 2014-02-23 DIAGNOSIS — E1165 Type 2 diabetes mellitus with hyperglycemia: Secondary | ICD-10-CM | POA: Diagnosis present

## 2014-02-23 DIAGNOSIS — K219 Gastro-esophageal reflux disease without esophagitis: Secondary | ICD-10-CM | POA: Diagnosis present

## 2014-02-23 DIAGNOSIS — H409 Unspecified glaucoma: Secondary | ICD-10-CM | POA: Diagnosis present

## 2014-02-23 DIAGNOSIS — H548 Legal blindness, as defined in USA: Secondary | ICD-10-CM | POA: Diagnosis present

## 2014-02-23 DIAGNOSIS — Z8249 Family history of ischemic heart disease and other diseases of the circulatory system: Secondary | ICD-10-CM | POA: Diagnosis not present

## 2014-02-23 DIAGNOSIS — Z6836 Body mass index (BMI) 36.0-36.9, adult: Secondary | ICD-10-CM | POA: Diagnosis not present

## 2014-02-23 DIAGNOSIS — Z794 Long term (current) use of insulin: Secondary | ICD-10-CM | POA: Diagnosis not present

## 2014-02-23 DIAGNOSIS — Z87891 Personal history of nicotine dependence: Secondary | ICD-10-CM | POA: Diagnosis not present

## 2014-02-23 DIAGNOSIS — F329 Major depressive disorder, single episode, unspecified: Secondary | ICD-10-CM | POA: Diagnosis present

## 2014-02-23 DIAGNOSIS — Z87442 Personal history of urinary calculi: Secondary | ICD-10-CM | POA: Diagnosis not present

## 2014-02-23 DIAGNOSIS — K5900 Constipation, unspecified: Secondary | ICD-10-CM | POA: Diagnosis present

## 2014-02-23 DIAGNOSIS — E1140 Type 2 diabetes mellitus with diabetic neuropathy, unspecified: Secondary | ICD-10-CM | POA: Diagnosis present

## 2014-02-23 DIAGNOSIS — F419 Anxiety disorder, unspecified: Secondary | ICD-10-CM | POA: Diagnosis present

## 2014-02-23 DIAGNOSIS — K529 Noninfective gastroenteritis and colitis, unspecified: Secondary | ICD-10-CM | POA: Diagnosis present

## 2014-02-23 DIAGNOSIS — J45909 Unspecified asthma, uncomplicated: Secondary | ICD-10-CM | POA: Diagnosis present

## 2014-02-23 LAB — COMPREHENSIVE METABOLIC PANEL
ALT: 13 U/L (ref 0–35)
AST: 14 U/L (ref 0–37)
Albumin: 3 g/dL — ABNORMAL LOW (ref 3.5–5.2)
Alkaline Phosphatase: 69 U/L (ref 39–117)
Anion gap: 7 (ref 5–15)
BUN: 7 mg/dL (ref 6–23)
CO2: 23 mmol/L (ref 19–32)
Calcium: 8.1 mg/dL — ABNORMAL LOW (ref 8.4–10.5)
Chloride: 108 mmol/L (ref 96–112)
Creatinine, Ser: 0.63 mg/dL (ref 0.50–1.10)
GFR calc Af Amer: >90 mL/min (ref >90)
GFR calc non Af Amer: >90 mL/min (ref >90)
Glucose, Bld: 169 mg/dL — ABNORMAL HIGH (ref 70–99)
Potassium: 3.2 mmol/L — ABNORMAL LOW (ref 3.5–5.1)
Sodium: 138 mmol/L (ref 135–145)
Total Bilirubin: 0.4 mg/dL (ref 0.3–1.2)
Total Protein: 6.4 g/dL (ref 6.0–8.3)

## 2014-02-23 LAB — C-REACTIVE PROTEIN: CRP: 16.5 mg/dL — ABNORMAL HIGH (ref <0.60)

## 2014-02-23 LAB — CLOSTRIDIUM DIFFICILE BY PCR: Toxigenic C. Difficile by PCR: NEGATIVE

## 2014-02-23 LAB — CBC
HCT: 35.6 % — ABNORMAL LOW (ref 36.0–46.0)
Hemoglobin: 11.2 g/dL — ABNORMAL LOW (ref 12.0–15.0)
MCH: 29.1 pg (ref 26.0–34.0)
MCHC: 31.5 g/dL (ref 30.0–36.0)
MCV: 92.5 fL (ref 78.0–100.0)
Platelets: 221 10*3/uL (ref 150–400)
RBC: 3.85 MIL/uL — ABNORMAL LOW (ref 3.87–5.11)
RDW: 13.6 % (ref 11.5–15.5)
WBC: 5.9 10*3/uL (ref 4.0–10.5)

## 2014-02-23 LAB — GLUCOSE, CAPILLARY
Glucose-Capillary: 162 mg/dL — ABNORMAL HIGH (ref 70–99)
Glucose-Capillary: 179 mg/dL — ABNORMAL HIGH (ref 70–99)
Glucose-Capillary: 115 mg/dL — ABNORMAL HIGH (ref 70–99)

## 2014-02-23 LAB — LACTIC ACID, PLASMA: Lactic Acid, Venous: 0.9 mmol/L (ref 0.5–2.0)

## 2014-02-23 LAB — PROTIME-INR
INR: 1.14 (ref 0.00–1.49)
Prothrombin Time: 14.7 seconds (ref 11.6–15.2)

## 2014-02-23 MED ORDER — PRAVASTATIN SODIUM 40 MG PO TABS
40.0000 mg | ORAL_TABLET | Freq: Every day | ORAL | Status: DC
  Administered 2014-02-23 – 2014-02-28 (×6): 40 mg via ORAL
  Filled 2014-02-23 (×7): qty 1

## 2014-02-23 MED ORDER — ONDANSETRON HCL 4 MG/2ML IJ SOLN: 4.0000 mg | Freq: Four times a day (QID) | INTRAMUSCULAR | Status: DC | PRN

## 2014-02-23 NOTE — Progress Notes (Signed)
[facility] PROGRESS NOTE  [name] PXT:[id number] DOB: [birth year] DOA: 02/22/2014 PCP: Lauree Chandler, NP  Assessment/Plan: Principal Problem:   Colitis -Gen. surgery on board as there is concern about appendicitis which is less likely according to their impression. -We'll continue current antibiotic regimen Cipro and Flagyl - WBC within normal limits and patient afebrile - C. difficile pending as well as GI pathogen panel  Active Problems:   GERD - Patient currently on Pepcid which I will discontinue based on principal problem    DM type 2, uncontrolled, with neuropathy - Diabetic diet, continue sliding scale insulin    Essential hypertension, benign -Continue lisinopril, verapamil    Chronic pain disorder - Continue current pain patient regimen. Currently stable  Code Status: Full Family Communication: No family at bedside Disposition Plan: Pending improvement in condition   Consultants:  General Surgery  Procedures:  None  Antibiotics:  Cipro and Flagyl  HPI/Subjective: Pt still having some abdominal discomfort and nausea. No other complaints otherwise.  Objective: Filed Vitals:   02/23/14 0600  BP: 120/72  Pulse: 72  Temp: 98.2 F (36.8 C)  Resp: 20    Intake/Output Summary (Last 24 hours) at 02/23/14 0944 Last data filed at 02/23/14 0636  Gross per 24 hour  Intake 1017.5 ml  Output   1100 ml  Net  -82.5 ml   Filed Weights   02/23/14 0500  Weight: 100.8 kg (222 lb 3.6 oz)    Exam:   General:  Patient in no acute distress, alert and awake  Cardiovascular: Regular rate and rhythm, no murmurs  Respiratory: Clear to auscultation bilaterally, no wheezes  Abdomen: Soft, nondistended, tenderness at left lower quadrant with deep palpation  Musculoskeletal: No cyanosis or clubbing   Data Reviewed: Basic Metabolic Panel:  Recent Labs Lab 02/22/14 1000 02/23/14 0530  NA 139 138  K 3.1* 3.2*  CL 109 108  CO2 23 23   GLUCOSE 251* 169*  BUN 13 7  CREATININE 0.79 0.63  CALCIUM 8.4 8.1*   Liver Function Tests:  Recent Labs Lab 02/22/14 1000 02/23/14 0530  AST 12 14  ALT 12 13  ALKPHOS 76 69  BILITOT 0.4 0.4  PROT 7.0 6.4  ALBUMIN 3.2* 3.0*   No results for input(s): LIPASE, AMYLASE in the last 168 hours. No results for input(s): AMMONIA in the last 168 hours. CBC:  Recent Labs Lab 02/22/14 1723 02/23/14 0530  WBC 6.7 5.9  NEUTROABS 4.7  --   HGB 12.0 11.2*  HCT 37.0 35.6*  MCV 92.5 92.5  PLT 212 221   Cardiac Enzymes: No results for input(s): CKTOTAL, CKMB, CKMBINDEX, TROPONINI in the last 168 hours. BNP (last 3 results) No results for input(s): BNP in the last 8760 hours.  ProBNP (last 3 results)  Recent Labs  06/16/13 1524  PROBNP 7.7    CBG:  Recent Labs Lab 02/22/14 2222 02/22/14 2348 02/23/14 0638  GLUCAP 185* 182* 162*    No results found for this or any previous visit (from the past 240 hour(s)).   Studies: Ct Abdomen Pelvis W Contrast  02/22/2014   CLINICAL DATA:  Acute right lower quadrant pain for 3-4 days. Gross hematuria.  EXAM: CT ABDOMEN AND PELVIS WITH CONTRAST  TECHNIQUE: Multidetector CT imaging of the abdomen and pelvis was performed using the standard protocol following bolus administration of intravenous contrast.  CONTRAST:  20m OMNIPAQUE IOHEXOL 300 MG/ML SOLN, 104mOMNIPAQUE IOHEXOL 300 MG/ML SOLN  COMPARISON:  CT scan of February 01, 2013.  FINDINGS: Minimal right pleural effusion is noted with adjacent subsegmental atelectasis. No significant osseous abnormality is noted.  No gallstones are noted. The liver, spleen and pancreas appear normal. Stable 2.5 cm right adrenal mass is noted consistent with adenoma. Bilateral nephrolithiasis is noted. No hydronephrosis or renal obstruction is noted. Mild wall thickening of descending colon is noted with minimal surrounding inflammatory changes suggesting colitis. There is no evidence of bowel  obstruction. No abnormal fluid collection is noted.  The appendix does appear to be mildly thickened proximally, measuring at least 12 mm, which is increased compared to prior exam. Appendicolith is also noted. Minimal inflammatory changes may be seen in the adjacent fat. Urinary bladder appears normal. No significant adenopathy is noted. Ovaries appear normal. Status post hysterectomy.  IMPRESSION: Minimal right pleural effusion is noted.  Bilateral nonobstructive nephrolithiasis is noted. No hydronephrosis or renal obstruction is noted.  Stable right adrenal adenoma.  Wall thickening and surrounding inflammatory changes are seen involving the descending colon suggesting colitis. This most likely is inflammatory or infectious in origin.  Mild thickening of the appendix is seen proximally, measuring 12 mm, which is increased compared to prior exam. Appendicolith is also now identified. There may be minimal inflammatory changes seen in the adjacent fat, and in the appropriate clinical setting, these findings would be suspicious for early appendicitis.   Electronically Signed   By: Sabino Dick M.D.   On: 02/22/2014 15:49    Scheduled Meds: . antiseptic oral rinse  7 mL Mouth Rinse q12n4p  . baclofen  10 mg Oral TID  . budesonide-formoterol  2 puff Inhalation BID  . chlorhexidine  15 mL Mouth Rinse BID  . ciprofloxacin  400 mg Intravenous Q12H  . clopidogrel  75 mg Oral Daily  . darifenacin  15 mg Oral Daily  . enoxaparin (LOVENOX) injection  40 mg Subcutaneous Q24H  . famotidine  20 mg Oral Daily  . FLUoxetine  60 mg Oral Daily  . fluticasone  2 spray Each Nare Daily  . gabapentin  300 mg Oral TID  . insulin aspart  0-15 Units Subcutaneous Q6H  . insulin glargine  45 Units Subcutaneous Q12H  . lisinopril  40 mg Oral Daily  . metronidazole  500 mg Intravenous Q8H  . potassium chloride  40 mEq Oral Once  . pravastatin  40 mg Oral q1800  . topiramate  200 mg Oral BID  . traZODone  150 mg Oral QHS   . verapamil  240 mg Oral QHS   Continuous Infusions: . sodium chloride Stopped (02/22/14 1623)     Time spent: > 35 minutes    Velvet Bathe  [facility] Pager [phone] If 7PM-7AM, please contact night-coverage at [email or web address], password [facility] 02/23/2014, 9:44 AM  LOS: 1 day

## 2014-02-23 NOTE — Progress Notes (Signed)
UR completed 

## 2014-02-23 NOTE — Progress Notes (Signed)
Patient ID: [name], female   DOB: Oct 18, 1965, 49 y.o.   MRN: [id number]  [facility]  HD#: [id number]  Subjective: Patient in bed.  Mild pain in LUQ.  Speaks softly.  No acute distress.  Objective: Vital signs in last 24 hours: Temp:  [98.2 F (36.8 C)-98.7 F (37.1 C)] 98.2 F (36.8 C) (02/05 0600) Pulse Rate:  [72-98] 72 (02/05 0600) Resp:  [18-20] 20 (02/05 0600) BP: (120-168)/(72-100) 120/72 mmHg (02/05 0600) SpO2:  [96 %-100 %] 96 % (02/05 0742) Weight:  [222 lb 3.6 oz (100.8 kg)] 222 lb 3.6 oz (100.8 kg) (02/05 0500)    Intake/Output from previous day: 02/04 0701 - 02/05 0700 In: 1017.5 [I.V.:517.5; IV Piggyback:500] Out: 1100 [Urine:1100] Intake/Output this shift:    Physical Exam: HEENT - sclerae clear, mucous membranes moist Neck - soft Chest - clear bilaterally Cor - RRR Abdomen - soft, obese; no tenderness RLQ, no mass, no guarding; LUQ with moderate tenderness, no mass, mild guarding Ext - no edema, non-tender Neuro - alert & oriented, no focal deficits  Lab Results:   Recent Labs  02/22/14 1723 02/23/14 0530  WBC 6.7 5.9  HGB 12.0 11.2*  HCT 37.0 35.6*  PLT 212 221   BMET  Recent Labs  02/22/14 1000 02/23/14 0530  NA 139 138  K 3.1* 3.2*  CL 109 108  CO2 23 23  GLUCOSE 251* 169*  BUN 13 7  CREATININE 0.79 0.63  CALCIUM 8.4 8.1*   PT/INR  Recent Labs  02/23/14 0530  LABPROT 14.7  INR 1.14   Comprehensive Metabolic Panel:    Component Value Date/Time   NA 138 02/23/2014 0530   NA 139 02/22/2014 1000   NA 136 12/25/2013 0911   NA 138 09/07/2013 0941   K 3.2* 02/23/2014 0530   K 3.1* 02/22/2014 1000   CL 108 02/23/2014 0530   CL 109 02/22/2014 1000   CO2 23 02/23/2014 0530   CO2 23 02/22/2014 1000   BUN 7 02/23/2014 0530   BUN 13 02/22/2014 1000   BUN 15 12/25/2013 0911   BUN 15 09/07/2013 0941   CREATININE 0.63 02/23/2014 0530   CREATININE 0.79 02/22/2014 1000   CREATININE 0.89  10/07/2012 1745   GLUCOSE 169* 02/23/2014 0530   GLUCOSE 251* 02/22/2014 1000   GLUCOSE 273* 12/25/2013 0911   GLUCOSE 201* 09/07/2013 0941   CALCIUM 8.1* 02/23/2014 0530   CALCIUM 8.4 02/22/2014 1000   AST 14 02/23/2014 0530   AST 12 02/22/2014 1000   ALT 13 02/23/2014 0530   ALT 12 02/22/2014 1000   ALKPHOS 69 02/23/2014 0530   ALKPHOS 76 02/22/2014 1000   BILITOT 0.4 02/23/2014 0530   BILITOT 0.4 02/22/2014 1000   PROT 6.4 02/23/2014 0530   PROT 7.0 02/22/2014 1000   PROT 6.9 12/25/2013 0911   PROT 7.1 09/07/2013 0941   ALBUMIN 3.0* 02/23/2014 0530   ALBUMIN 3.2* 02/22/2014 1000    Studies/Results: Ct Abdomen Pelvis W Contrast  02/22/2014   CLINICAL DATA:  Acute right lower quadrant pain for 3-4 days. Gross hematuria.  EXAM: CT ABDOMEN AND PELVIS WITH CONTRAST  TECHNIQUE: Multidetector CT imaging of the abdomen and pelvis was performed using the standard protocol following bolus administration of intravenous contrast.  CONTRAST:  53m OMNIPAQUE IOHEXOL 300 MG/ML SOLN, 1068mOMNIPAQUE IOHEXOL 300 MG/ML SOLN  COMPARISON:  CT scan of February 01, 2013.  FINDINGS: Minimal right pleural effusion is noted with adjacent subsegmental atelectasis.  No significant osseous abnormality is noted.  No gallstones are noted. The liver, spleen and pancreas appear normal. Stable 2.5 cm right adrenal mass is noted consistent with adenoma. Bilateral nephrolithiasis is noted. No hydronephrosis or renal obstruction is noted. Mild wall thickening of descending colon is noted with minimal surrounding inflammatory changes suggesting colitis. There is no evidence of bowel obstruction. No abnormal fluid collection is noted.  The appendix does appear to be mildly thickened proximally, measuring at least 12 mm, which is increased compared to prior exam. Appendicolith is also noted. Minimal inflammatory changes may be seen in the adjacent fat. Urinary bladder appears normal. No significant adenopathy is noted. Ovaries  appear normal. Status post hysterectomy.  IMPRESSION: Minimal right pleural effusion is noted.  Bilateral nonobstructive nephrolithiasis is noted. No hydronephrosis or renal obstruction is noted.  Stable right adrenal adenoma.  Wall thickening and surrounding inflammatory changes are seen involving the descending colon suggesting colitis. This most likely is inflammatory or infectious in origin.  Mild thickening of the appendix is seen proximally, measuring 12 mm, which is increased compared to prior exam. Appendicolith is also now identified. There may be minimal inflammatory changes seen in the adjacent fat, and in the appropriate clinical setting, these findings would be suspicious for early appendicitis.   Electronically Signed   By: Sabino Dick M.D.   On: 02/22/2014 15:49    Anti-infectives: Anti-infectives    Start     Dose/Rate Route Frequency Ordered Stop   02/23/14 0400  metroNIDAZOLE (FLAGYL) IVPB 500 mg     500 mg100 mL/hr over 60 Minutes Intravenous Every 8 hours 02/22/14 2044     02/22/14 1900  ciprofloxacin (CIPRO) IVPB 400 mg     400 mg200 mL/hr over 60 Minutes Intravenous  Once 02/22/14 1845 02/22/14 2002   02/22/14 1900  metroNIDAZOLE (FLAGYL) IVPB 500 mg     500 mg100 mL/hr over 60 Minutes Intravenous  Once 02/22/14 1845 02/22/14 2002   02/22/14 1900  ciprofloxacin (CIPRO) IVPB 400 mg     400 mg200 mL/hr over 60 Minutes Intravenous Every 12 hours 02/22/14 1846     02/22/14 1900  metroNIDAZOLE (FLAGYL) IVPB 500 mg  Status:  Discontinued     500 mg100 mL/hr over 60 Minutes Intravenous Every 8 hours 02/22/14 1846 02/23/14 0550      Assessment & Plans: 1.  Colitis by CT scan  On Cipro and Flagyl  May need GI consult, possible colonoscopy 2.  No evidence acute appendicitis clinically  CT scan reviewed  Will follow - abx treatment should be sufficient if early appendicitis (doubt)  Will follow with you.  Earnstine Regal, MD, [facility] Office:  [place] 02/23/2014

## 2014-02-24 LAB — GLUCOSE, CAPILLARY
Glucose-Capillary: 100 mg/dL — ABNORMAL HIGH (ref 70–99)
Glucose-Capillary: 124 mg/dL — ABNORMAL HIGH (ref 70–99)
Glucose-Capillary: 130 mg/dL — ABNORMAL HIGH (ref 70–99)
Glucose-Capillary: 145 mg/dL — ABNORMAL HIGH (ref 70–99)
Glucose-Capillary: 149 mg/dL — ABNORMAL HIGH (ref 70–99)

## 2014-02-24 LAB — HEMOGLOBIN A1C
Hgb A1c MFr Bld: 11 % — ABNORMAL HIGH (ref 4.8–5.6)
Mean Plasma Glucose: 269 mg/dL

## 2014-02-24 NOTE — Progress Notes (Signed)
CARE MANAGEMENT NOTE 02/24/2014  Patient:  [name], [name]   Account Number:  [id number]  Date Initiated:  02/23/2014  Documentation initiated by:  Edwyna Shell  Subjective/Objective Assessment:   49 yo female admitted with colitis from home     Action/Plan:   discharge planning   Anticipated DC Date:  02/25/2014   Anticipated DC Plan:  [place]  CM consult      Choice offered to / List presented to:             Status of service:  In process, will continue to follow Medicare Important Message given?   (If response is "NO", the following Medicare IM given date fields will be blank) Date Medicare IM given:   Medicare IM given by:   Date Additional Medicare IM given:   Additional Medicare IM given by:    Discharge Disposition:    Per UR Regulation:    If discussed at Long Length of Stay Meetings, dates discussed:    Comments:  02/24/2014 1545  NCM spoke to pt and gave permission to speak with dtr, Russ Halo # [phone]. States her mother is currently living in her home. She had hospital bed when she was at the ALF and is requesting a hospital bed, rolling walker with seat and CPAP for home. States she had a sleep study last year in Feb, and CPAP was recommended but she switched PCP. Message sent to attending for orders for DME. Jonnie Finner RN CCM Case Mgmt phone [phone]  02/23/14 Edwyna Shell RN BSN CM [phone] Chart reviewed, no needs

## 2014-02-24 NOTE — Progress Notes (Signed)
[facility] PROGRESS NOTE  [name] NAT:[id number] DOB: [birth year] DOA: 02/22/2014 PCP: Lauree Chandler, NP  Assessment/Plan: Principal Problem:   Colitis -Gen. surgery on board as there is concern about appendicitis which is less likely according to their impression. -We'll continue current antibiotic regimen Cipro and Flagyl, patient currently improving on this regimen - WBC within normal limits and patient afebrile - C. difficile negative, GI pathogen panel pending  Active Problems:   GERD - Patient currently on Pepcid which I will discontinue based on principal problem    DM type 2, uncontrolled, with neuropathy - Diabetic diet, continue sliding scale insulin    Essential hypertension, benign -Continue lisinopril, verapamil    Chronic pain disorder - Continue current pain patient regimen. Currently stable  Code Status: Full Family Communication: No family at bedside Disposition Plan: Pending improvement in condition   Consultants:  General Surgery  Procedures:  None  Antibiotics:  Cipro and Flagyl  HPI/Subjective: The patient has some mild abdominal discomfort but feels better  Objective: Filed Vitals:   02/24/14 1351  BP: 129/85  Pulse: 64  Temp: 97.4 F (36.3 C)  Resp: 18    Intake/Output Summary (Last 24 hours) at 02/24/14 1526 Last data filed at 02/24/14 1300  Gross per 24 hour  Intake   1140 ml  Output      2 ml  Net   1138 ml   Filed Weights   02/23/14 0500 02/24/14 0616  Weight: 100.8 kg (222 lb 3.6 oz) 101.833 kg (224 lb 8 oz)    Exam:   General:  Patient in no acute distress, alert and awake  Cardiovascular: Regular rate and rhythm, no murmurs  Respiratory: Clear to auscultation bilaterally, no wheezes  Abdomen: Soft, nondistended, tenderness at left lower quadrant with deep palpation  Musculoskeletal: No cyanosis or clubbing   Data Reviewed: Basic Metabolic Panel:  Recent Labs Lab 02/22/14 1000  02/23/14 0530  NA 139 138  K 3.1* 3.2*  CL 109 108  CO2 23 23  GLUCOSE 251* 169*  BUN 13 7  CREATININE 0.79 0.63  CALCIUM 8.4 8.1*   Liver Function Tests:  Recent Labs Lab 02/22/14 1000 02/23/14 0530  AST 12 14  ALT 12 13  ALKPHOS 76 69  BILITOT 0.4 0.4  PROT 7.0 6.4  ALBUMIN 3.2* 3.0*   No results for input(s): LIPASE, AMYLASE in the last 168 hours. No results for input(s): AMMONIA in the last 168 hours. CBC:  Recent Labs Lab 02/22/14 1723 02/23/14 0530  WBC 6.7 5.9  NEUTROABS 4.7  --   HGB 12.0 11.2*  HCT 37.0 35.6*  MCV 92.5 92.5  PLT 212 221   Cardiac Enzymes: No results for input(s): CKTOTAL, CKMB, CKMBINDEX, TROPONINI in the last 168 hours. BNP (last 3 results) No results for input(s): BNP in the last 8760 hours.  ProBNP (last 3 results)  Recent Labs  06/16/13 1524  PROBNP 7.7    CBG:  Recent Labs Lab 02/23/14 1202 02/23/14 1810 02/24/14 0004 02/24/14 0607 02/24/14 1140  GLUCAP 179* 115* 145* 130* 149*    Recent Results (from the past 240 hour(s))  Clostridium Difficile by PCR     Status: None   Collection Time: 02/23/14  8:05 AM  Result Value Ref Range Status   C difficile by pcr NEGATIVE NEGATIVE Final    Comment: Performed at [facility]     Studies: No results found.  Scheduled Meds: . antiseptic oral rinse  7 mL Mouth  Rinse q12n4p  . baclofen  10 mg Oral TID  . budesonide-formoterol  2 puff Inhalation BID  . chlorhexidine  15 mL Mouth Rinse BID  . ciprofloxacin  400 mg Intravenous Q12H  . clopidogrel  75 mg Oral Daily  . darifenacin  15 mg Oral Daily  . enoxaparin (LOVENOX) injection  40 mg Subcutaneous Q24H  . FLUoxetine  60 mg Oral Daily  . fluticasone  2 spray Each Nare Daily  . gabapentin  300 mg Oral TID  . insulin aspart  0-15 Units Subcutaneous Q6H  . insulin glargine  45 Units Subcutaneous Q12H  . lisinopril  40 mg Oral Daily  . metronidazole  500 mg Intravenous Q8H  . potassium chloride  40 mEq Oral  Once  . pravastatin  40 mg Oral q1800  . topiramate  200 mg Oral BID  . traZODone  150 mg Oral QHS  . verapamil  240 mg Oral QHS   Continuous Infusions: . sodium chloride 75 mL/hr at 02/24/14 1355     Time spent: > 35 minutes    Velvet Bathe  [facility] Pager [phone] If 7PM-7AM, please contact night-coverage at [email or web address], password [facility] 02/24/2014, 3:26 PM  LOS: 2 days

## 2014-02-24 NOTE — Progress Notes (Signed)
Subjective: Still having pain on left side of abdomen. No fever. Normal wbc  Objective: Vital signs in last 24 hours: Temp:  [97.9 F (36.6 C)-98.3 F (36.8 C)] 97.9 F (36.6 C) (02/06 0616) Pulse Rate:  [56-80] 56 (02/06 0616) Resp:  [20] 20 (02/06 0616) BP: (117-140)/(68-76) 129/75 mmHg (02/06 0616) SpO2:  [95 %-99 %] 95 % (02/06 0830) Weight:  [224 lb 8 oz (101.833 kg)] 224 lb 8 oz (101.833 kg) (02/06 0616) Last BM Date: 02/23/14  Intake/Output from previous day: 02/05 0701 - 02/06 0700 In: 1296.3 [I.V.:1196.3; IV Piggyback:100] Out: -  Intake/Output this shift:    Resp: clear to auscultation bilaterally Cardio: regular rate and rhythm GI: soft, tender on left.   Lab Results:   Recent Labs  02/22/14 1723 02/23/14 0530  WBC 6.7 5.9  HGB 12.0 11.2*  HCT 37.0 35.6*  PLT 212 221   BMET  Recent Labs  02/22/14 1000 02/23/14 0530  NA 139 138  K 3.1* 3.2*  CL 109 108  CO2 23 23  GLUCOSE 251* 169*  BUN 13 7  CREATININE 0.79 0.63  CALCIUM 8.4 8.1*   PT/INR  Recent Labs  02/23/14 0530  LABPROT 14.7  INR 1.14   ABG No results for input(s): PHART, HCO3 in the last 72 hours.  Invalid input(s): PCO2, PO2  Studies/Results: Ct Abdomen Pelvis W Contrast  02/22/2014   CLINICAL DATA:  Acute right lower quadrant pain for 3-4 days. Gross hematuria.  EXAM: CT ABDOMEN AND PELVIS WITH CONTRAST  TECHNIQUE: Multidetector CT imaging of the abdomen and pelvis was performed using the standard protocol following bolus administration of intravenous contrast.  CONTRAST:  62m OMNIPAQUE IOHEXOL 300 MG/ML SOLN, 1041mOMNIPAQUE IOHEXOL 300 MG/ML SOLN  COMPARISON:  CT scan of February 01, 2013.  FINDINGS: Minimal right pleural effusion is noted with adjacent subsegmental atelectasis. No significant osseous abnormality is noted.  No gallstones are noted. The liver, spleen and pancreas appear normal. Stable 2.5 cm right adrenal mass is noted consistent with adenoma. Bilateral  nephrolithiasis is noted. No hydronephrosis or renal obstruction is noted. Mild wall thickening of descending colon is noted with minimal surrounding inflammatory changes suggesting colitis. There is no evidence of bowel obstruction. No abnormal fluid collection is noted.  The appendix does appear to be mildly thickened proximally, measuring at least 12 mm, which is increased compared to prior exam. Appendicolith is also noted. Minimal inflammatory changes may be seen in the adjacent fat. Urinary bladder appears normal. No significant adenopathy is noted. Ovaries appear normal. Status post hysterectomy.  IMPRESSION: Minimal right pleural effusion is noted.  Bilateral nonobstructive nephrolithiasis is noted. No hydronephrosis or renal obstruction is noted.  Stable right adrenal adenoma.  Wall thickening and surrounding inflammatory changes are seen involving the descending colon suggesting colitis. This most likely is inflammatory or infectious in origin.  Mild thickening of the appendix is seen proximally, measuring 12 mm, which is increased compared to prior exam. Appendicolith is also now identified. There may be minimal inflammatory changes seen in the adjacent fat, and in the appropriate clinical setting, these findings would be suspicious for early appendicitis.   Electronically Signed   By: JaSabino Dick.D.   On: 02/22/2014 15:49    Anti-infectives: Anti-infectives    Start     Dose/Rate Route Frequency Ordered Stop   02/23/14 0400  metroNIDAZOLE (FLAGYL) IVPB 500 mg     500 mg100 mL/hr over 60 Minutes Intravenous Every 8 hours 02/22/14 2044  02/22/14 1900  ciprofloxacin (CIPRO) IVPB 400 mg     400 mg200 mL/hr over 60 Minutes Intravenous  Once 02/22/14 1845 02/22/14 2002   02/22/14 1900  metroNIDAZOLE (FLAGYL) IVPB 500 mg     500 mg100 mL/hr over 60 Minutes Intravenous  Once 02/22/14 1845 02/22/14 2002   02/22/14 1900  ciprofloxacin (CIPRO) IVPB 400 mg     400 mg200 mL/hr over 60 Minutes  Intravenous Every 12 hours 02/22/14 1846     02/22/14 1900  metroNIDAZOLE (FLAGYL) IVPB 500 mg  Status:  Discontinued     500 mg100 mL/hr over 60 Minutes Intravenous Every 8 hours 02/22/14 1846 02/23/14 0550      Assessment/Plan: s/p * No surgery found * continue clears until pain improves  Continue abx Will follow  LOS: 2 days    TOTH III,PAUL S 02/24/2014

## 2014-02-25 LAB — GLUCOSE, CAPILLARY
Glucose-Capillary: 102 mg/dL — ABNORMAL HIGH (ref 70–99)
Glucose-Capillary: 102 mg/dL — ABNORMAL HIGH (ref 70–99)
Glucose-Capillary: 131 mg/dL — ABNORMAL HIGH (ref 70–99)
Glucose-Capillary: 170 mg/dL — ABNORMAL HIGH (ref 70–99)
Glucose-Capillary: 172 mg/dL — ABNORMAL HIGH (ref 70–99)

## 2014-02-25 NOTE — Progress Notes (Signed)
[facility] PROGRESS NOTE  [name] JGO:[id number] DOB: [birth year] DOA: 02/22/2014 PCP: Lauree Chandler, NP  Assessment/Plan: Principal Problem:   Colitis - Gen. surgery on board as there is concern about appendicitis which is less likely according to their impression. - We'll continue current antibiotic regimen Cipro and Flagyl, patient currently improving on this regimen - WBC within normal limits and patient afebrile - C. difficile negative, GI pathogen panel pending  Active Problems:   GERD - Patient currently on Pepcid which I will discontinue based on principal problem    DM type 2, uncontrolled, with neuropathy - Diabetic diet, continue sliding scale insulin    Essential hypertension, benign - Continue lisinopril, verapamil    Chronic pain disorder - Continue current pain patient regimen. Currently stable  Code Status: Full Family Communication: No family at bedside Disposition Plan: Pending improvement in condition   Consultants:  General Surgery  Procedures:  None  Antibiotics:  Cipro and Flagyl  HPI/Subjective: The patient has some mild abdominal discomfort but feels better  Objective: Filed Vitals:   02/25/14 1356  BP: 138/73  Pulse: 71  Temp: 97.9 F (36.6 C)  Resp: 18    Intake/Output Summary (Last 24 hours) at 02/25/14 1537 Last data filed at 02/25/14 0830  Gross per 24 hour  Intake   1780 ml  Output      2 ml  Net   1778 ml   Filed Weights   02/23/14 0500 02/24/14 0616 02/25/14 5597  Weight: 100.8 kg (222 lb 3.6 oz) 101.833 kg (224 lb 8 oz) 101.696 kg (224 lb 3.2 oz)    Exam:   General:  Patient in no acute distress, alert and awake  Cardiovascular: Regular rate and rhythm, no murmurs  Respiratory: Clear to auscultation bilaterally, no wheezes  Abdomen: Soft, nondistended, tenderness at left lower quadrant with deep palpation  Musculoskeletal: No cyanosis or clubbing   Data Reviewed: Basic Metabolic  Panel:  Recent Labs Lab 02/22/14 1000 02/23/14 0530  NA 139 138  K 3.1* 3.2*  CL 109 108  CO2 23 23  GLUCOSE 251* 169*  BUN 13 7  CREATININE 0.79 0.63  CALCIUM 8.4 8.1*   Liver Function Tests:  Recent Labs Lab 02/22/14 1000 02/23/14 0530  AST 12 14  ALT 12 13  ALKPHOS 76 69  BILITOT 0.4 0.4  PROT 7.0 6.4  ALBUMIN 3.2* 3.0*   No results for input(s): LIPASE, AMYLASE in the last 168 hours. No results for input(s): AMMONIA in the last 168 hours. CBC:  Recent Labs Lab 02/22/14 1723 02/23/14 0530  WBC 6.7 5.9  NEUTROABS 4.7  --   HGB 12.0 11.2*  HCT 37.0 35.6*  MCV 92.5 92.5  PLT 212 221   Cardiac Enzymes: No results for input(s): CKTOTAL, CKMB, CKMBINDEX, TROPONINI in the last 168 hours. BNP (last 3 results) No results for input(s): BNP in the last 8760 hours.  ProBNP (last 3 results)  Recent Labs  06/16/13 1524  PROBNP 7.7    CBG:  Recent Labs Lab 02/24/14 1711 02/24/14 2239 02/25/14 0023 02/25/14 0601 02/25/14 1211  GLUCAP 124* 100* 102* 102* 131*    Recent Results (from the past 240 hour(s))  Clostridium Difficile by PCR     Status: None   Collection Time: 02/23/14  8:05 AM  Result Value Ref Range Status   C difficile by pcr NEGATIVE NEGATIVE Final    Comment: Performed at [facility]     Studies: No results found.  Scheduled Meds: . antiseptic oral rinse  7 mL Mouth Rinse q12n4p  . baclofen  10 mg Oral TID  . budesonide-formoterol  2 puff Inhalation BID  . chlorhexidine  15 mL Mouth Rinse BID  . ciprofloxacin  400 mg Intravenous Q12H  . clopidogrel  75 mg Oral Daily  . darifenacin  15 mg Oral Daily  . enoxaparin (LOVENOX) injection  40 mg Subcutaneous Q24H  . FLUoxetine  60 mg Oral Daily  . fluticasone  2 spray Each Nare Daily  . gabapentin  300 mg Oral TID  . insulin aspart  0-15 Units Subcutaneous Q6H  . insulin glargine  45 Units Subcutaneous Q12H  . lisinopril  40 mg Oral Daily  . metronidazole  500 mg  Intravenous Q8H  . potassium chloride  40 mEq Oral Once  . pravastatin  40 mg Oral q1800  . topiramate  200 mg Oral BID  . traZODone  150 mg Oral QHS  . verapamil  240 mg Oral QHS   Continuous Infusions: . sodium chloride 75 mL/hr at 02/25/14 0647     Time spent: > 35 minutes    Velvet Bathe  [facility] Pager [phone] If 7PM-7AM, please contact night-coverage at [email or web address], password [facility] 02/25/2014, 3:37 PM  LOS: 3 days

## 2014-02-25 NOTE — Progress Notes (Signed)
  Subjective: She feels a little better today  Objective: Vital signs in last 24 hours: Temp:  [97.3 F (36.3 C)-98.3 F (36.8 C)] 97.3 F (36.3 C) (02/07 0614) Pulse Rate:  [64-68] 68 (02/07 0614) Resp:  [18] 18 (02/07 0614) BP: (112-137)/(71-89) 112/71 mmHg (02/07 0614) SpO2:  [97 %-100 %] 97 % (02/07 0614) Weight:  [224 lb 3.2 oz (101.696 kg)] 224 lb 3.2 oz (101.696 kg) (02/07 7948) Last BM Date: 02/23/14  Intake/Output from previous day: 02/06 0701 - 02/07 0700 In: 2140 [P.O.:840; I.V.:1200; IV Piggyback:100] Out: 4 [Urine:4] Intake/Output this shift:    Resp: clear to auscultation bilaterally Cardio: regular rate and rhythm GI: softer, less tender in LLQ. no guarding  Lab Results:   Recent Labs  02/22/14 1723 02/23/14 0530  WBC 6.7 5.9  HGB 12.0 11.2*  HCT 37.0 35.6*  PLT 212 221   BMET  Recent Labs  02/22/14 1000 02/23/14 0530  NA 139 138  K 3.1* 3.2*  CL 109 108  CO2 23 23  GLUCOSE 251* 169*  BUN 13 7  CREATININE 0.79 0.63  CALCIUM 8.4 8.1*   PT/INR  Recent Labs  02/23/14 0530  LABPROT 14.7  INR 1.14   ABG No results for input(Calderon): PHART, HCO3 in the last 72 hours.  Invalid input(Calderon): PCO2, PO2  Studies/Results: No results found.  Anti-infectives: Anti-infectives    Start     Dose/Rate Route Frequency Ordered Stop   02/23/14 0400  metroNIDAZOLE (FLAGYL) IVPB 500 mg     500 mg100 mL/hr over 60 Minutes Intravenous Every 8 hours 02/22/14 2044     02/22/14 1900  ciprofloxacin (CIPRO) IVPB 400 mg     400 mg200 mL/hr over 60 Minutes Intravenous  Once 02/22/14 1845 02/22/14 2010   02/22/14 1900  metroNIDAZOLE (FLAGYL) IVPB 500 mg     500 mg100 mL/hr over 60 Minutes Intravenous  Once 02/22/14 1845 02/22/14 2010   02/22/14 1900  ciprofloxacin (CIPRO) IVPB 400 mg     400 mg200 mL/hr over 60 Minutes Intravenous Every 12 hours 02/22/14 1846     02/22/14 1900  metroNIDAZOLE (FLAGYL) IVPB 500 mg  Status:  Discontinued     500 mg100 mL/hr over  60 Minutes Intravenous Every 8 hours 02/22/14 1846 02/23/14 0550      Assessment/Plan: Calderon/p * No surgery found * continue abx and bowel rest  Will follow  LOS: 3 days    TOTH III,[name] 02/25/2014

## 2014-02-25 NOTE — Progress Notes (Signed)
Pt placed on Auto CPAP 5-20 CMH20 via F[facility].  Pt tolerating well at this time, RT to monitor and assess as needed.

## 2014-02-26 ENCOUNTER — Telehealth: Payer: Self-pay | Admitting: *Deleted

## 2014-02-26 LAB — GLUCOSE, CAPILLARY: Glucose-Capillary: 132 mg/dL — ABNORMAL HIGH (ref 70–99)

## 2014-02-26 NOTE — Progress Notes (Signed)
Subjective: No BM for a couple days, still having pain Left and some in the RLQ.  Tolerating full liquids. Objective: Vital signs in last 24 hours: Temp:  [97.7 F (36.5 C)-98.2 F (36.8 C)] 97.7 F (36.5 C) (02/08 0533) Pulse Rate:  [71-96] 96 (02/08 0533) Resp:  [18] 18 (02/08 0533) BP: (130-162)/(67-79) 162/67 mmHg (02/08 0533) SpO2:  [96 %-99 %] 99 % (02/08 0533) Last BM Date: 02/23/14 720 PO Full liquid diet NO BM recorded. Afebrile, VSS Last labs 02/22/14 CT 02/22/14 No radiology since 02/22/14. Intake/Output from previous day: 02/07 0701 - 02/08 0700 In: 1620 [P.O.:720; I.V.:900] Out: 3 [Urine:3] Intake/Output this shift:    General appearance: alert, cooperative and no distress GI: soft, tender RLQ and LUQ, some in LLQ. +BS, no peritonitis  Lab Results:  No results for input(s): WBC, HGB, HCT, PLT in the last 72 hours.  BMET No results for input(s): NA, K, CL, CO2, GLUCOSE, BUN, CREATININE, CALCIUM in the last 72 hours. PT/INR No results for input(s): LABPROT, INR in the last 72 hours.   Recent Labs Lab 02/22/14 1000 02/23/14 0530  AST 12 14  ALT 12 13  ALKPHOS 76 69  BILITOT 0.4 0.4  PROT 7.0 6.4  ALBUMIN 3.2* 3.0*     Lipase     Component Value Date/Time   LIPASE 29 02/06/2013 1810     Studies/Results: No results found.  Medications: . antiseptic oral rinse  7 mL Mouth Rinse q12n4p  . baclofen  10 mg Oral TID  . budesonide-formoterol  2 puff Inhalation BID  . chlorhexidine  15 mL Mouth Rinse BID  . ciprofloxacin  400 mg Intravenous Q12H  . clopidogrel  75 mg Oral Daily  . darifenacin  15 mg Oral Daily  . enoxaparin (LOVENOX) injection  40 mg Subcutaneous Q24H  . FLUoxetine  60 mg Oral Daily  . fluticasone  2 spray Each Nare Daily  . gabapentin  300 mg Oral TID  . insulin aspart  0-15 Units Subcutaneous Q6H  . insulin glargine  45 Units Subcutaneous Q12H  . lisinopril  40 mg Oral Daily  . metronidazole  500 mg Intravenous Q8H  .  potassium chloride  40 mEq Oral Once  . pravastatin  40 mg Oral q1800  . topiramate  200 mg Oral BID  . traZODone  150 mg Oral QHS  . verapamil  240 mg Oral QHS   . sodium chloride 75 mL/hr at 02/25/14 7412   Prior to Admission medications   Medication Sig Start Date End Date Taking? Authorizing Provider  baclofen (LIORESAL) 10 MG tablet TAKE 1 TABLET BY MOUTH THREE TIMES A DAY AS NEEDED FOR MUSCLE SPASMS 01/26/14  Yes Monica Norco, DO  cetirizine (ZYRTEC) 10 MG tablet Take 10 mg by mouth 3 times/day as needed-between meals & bedtime for allergies. For allergies   Yes Historical Provider, MD  clopidogrel (PLAVIX) 75 MG tablet TAKE 1 TABLET BY MOUTH EVERY DAY 01/25/14  Yes Lauree Chandler, NP  EASY TOUCH PEN NEEDLES 31G X 8 MM MISC USE AS DIRECTED TO INJECT INSULIN DAILY 01/25/14  Yes Lauree Chandler, NP  FLUoxetine (PROZAC) 20 MG capsule Take 3 capsules (60 mg total) by mouth daily at 12 noon. 12/29/13  Yes Norma Fredrickson, MD  fluticasone (FLONASE) 50 MCG/ACT nasal spray Place 2 sprays into both nostrils daily.   Yes Historical Provider, MD  gabapentin (NEURONTIN) 300 MG capsule TAKE 2 CAPSULES BY MOUTH THREE TIMES A DAY 11/09/13  Yes Tiffany L Reed, DO  ibuprofen (ADVIL,MOTRIN) 600 MG tablet Take one tablet by mouth every 8 hours as needed for pain Patient taking differently: Take 600 mg by mouth every 8 (eight) hours.  02/15/14  Yes Lauree Chandler, NP  Insulin Glargine (TOUJEO SOLOSTAR) 300 UNIT/ML SOPN Inject 90 Units into the skin 2 (two) times daily. 02/05/14  Yes Tivis Ringer, RPH-CPP  Insulin Lispro, Human, (HUMALOG KWIKPEN) 200 UNIT/ML SOPN Inject 30 Units into the skin 3 (three) times daily with meals. (if blood sugar >300, give 35 units) 12/25/13  Yes Tivis Ringer, RPH-CPP  lisinopril (PRINIVIL,ZESTRIL) 40 MG tablet Take 1 tablet (40 mg total) by mouth daily. 01/16/14  Yes Blanchie Serve, MD  ONE TOUCH ULTRA TEST test strip USE TO CHECK BLOOD SUGAR FOUR TIMES A DAY  12/04/13  Yes Hennie Duos, MD  [facility] DELICA LANCETS 43O MISC TEST BLOOD SUGAR FOUR TIMES A DAY 01/25/14  Yes Lauree Chandler, NP  pantoprazole (PROTONIX) 40 MG tablet TAKE 1 TABLET BY MOUTH DAILY 12/04/13  Yes Lauree Chandler, NP  PROAIR HFA 108 (90 BASE) MCG/ACT inhaler INHALE 2 PUFFS BY MOUTH EVERY 6 HOURS AS NEEDED FOR WHEEZING OR SHORTNESS OF BREATH 01/25/14  Yes Lauree Chandler, NP  ranitidine (ZANTAC) 150 MG tablet TAKE 1 TABLET BY MOUTH TWICE DAILY 12/04/13  Yes Lauree Chandler, NP  simvastatin (ZOCOR) 20 MG tablet TAKE 1 TABLET BY MOUTH EVERY MORNING FOR CHOLESTROL 01/02/14  Yes Estill Dooms, MD  solifenacin (VESICARE) 10 MG tablet Take 1 tablet (10 mg total) by mouth daily. 10/02/13  Yes Tivis Ringer, RPH-CPP  SYMBICORT 160-4.5 MCG/ACT inhaler INHALE 2 PUFFS BY MOUTH TWICE DAILY 01/25/14  Yes Lauree Chandler, NP  topiramate (TOPAMAX) 200 MG tablet TAKE 1 TABLET BY MOUTH TWICE DAILY 11/09/13  Yes Tiffany L Reed, DO  traZODone (DESYREL) 150 MG tablet Take 1 tablet (150 mg total) by mouth at bedtime. 2 qhs Patient taking differently: Take 300 mg by mouth at bedtime.  12/29/13  Yes Norma Fredrickson, MD  verapamil (VERELAN PM) 240 MG 24 hr capsule TAKE 1 CAPSULE BY MOUTH EVERY NIGHT AT BEDTIME FOR BLOOD PRESSURE 11/09/13  Yes Tiffany L Reed, DO  Vitamin D, Ergocalciferol, (DRISDOL) 50000 UNITS CAPS capsule Take 50,000 Units by mouth every Thursday.   Yes Historical Provider, MD  zolpidem (AMBIEN) 10 MG tablet Take 1 tablet (10 mg total) by mouth at bedtime as needed for sleep. 12/29/13  Yes Norma Fredrickson, MD  amoxicillin (AMOXIL) 500 MG capsule Take 1 capsule by mouth 2 (two) times daily. 01/18/14   Historical Provider, MD  Tdap (BOOSTRIX) 5-2.5-18.5 LF-MCG/0.5 injection Inject 0.5 mLs into the muscle once. Patient not taking: Reported on 02/22/2014 10/30/13   Tivis Ringer, RPH-CPP     Assessment/Plan Abdominal pain Colitis/possible appendicitis (day 4 Cipro/Flagyl completed  yesterday) CAD (daily Plavix)  COPD  Chronic pain  AODM hX OF SEIZURES OSA Hx of migraines Blind left eye Hx of anxiety and depression Body mass index is 36.2  Lovenox for DVT  Plan: Agree with antibiotics, I would recheck her labs and if necessary recheck CT tomorrow.     LOS: 4 days    [name] 02/26/2014

## 2014-02-26 NOTE — Progress Notes (Signed)
[facility] PROGRESS NOTE  [name] JTT:[id number] DOB: [birth year] DOA: 02/22/2014 PCP: Lauree Chandler, NP  Assessment/Plan: Principal Problem:   Colitis - Gen. surgery on board as there is concern about appendicitis which is less likely according to their impression. - We'll continue current antibiotic regimen Cipro and Flagyl, patient currently improving on this regimen - WBC within normal limits and patient afebrile - C. difficile negative, GI pathogen panel pending - Consideration being placed for repeat CT scan of abdomen.  Active Problems:   GERD - Patient currently on Pepcid which I will discontinue based on principal problem    DM type 2, uncontrolled, with neuropathy - Diabetic diet, continue sliding scale insulin    Essential hypertension, benign - Continue lisinopril, verapamil    Chronic pain disorder - Continue current pain patient regimen. Currently stable  Code Status: Full Family Communication: No family at bedside Disposition Plan: Pending improvement in condition   Consultants:  General Surgery  Procedures:  None  Antibiotics:  Cipro and Flagyl  HPI/Subjective: The patient has some mild abdominal discomfort but feels better  Objective: Filed Vitals:   02/26/14 1316  BP: 115/72  Pulse: 69  Temp: 98.1 F (36.7 C)  Resp: 20    Intake/Output Summary (Last 24 hours) at 02/26/14 1725 Last data filed at 02/26/14 1147  Gross per 24 hour  Intake    900 ml  Output      2 ml  Net    898 ml   Filed Weights   02/23/14 0500 02/24/14 0616 02/25/14 0092  Weight: 100.8 kg (222 lb 3.6 oz) 101.833 kg (224 lb 8 oz) 101.696 kg (224 lb 3.2 oz)    Exam:   General:  Patient in no acute distress, alert and awake  Cardiovascular: Regular rate and rhythm, no murmurs  Respiratory: Clear to auscultation bilaterally, no wheezes  Abdomen: Soft, nondistended, tenderness at left lower quadrant with deep palpation  Musculoskeletal: No  cyanosis or clubbing   Data Reviewed: Basic Metabolic Panel:  Recent Labs Lab 02/22/14 1000 02/23/14 0530  NA 139 138  K 3.1* 3.2*  CL 109 108  CO2 23 23  GLUCOSE 251* 169*  BUN 13 7  CREATININE 0.79 0.63  CALCIUM 8.4 8.1*   Liver Function Tests:  Recent Labs Lab 02/22/14 1000 02/23/14 0530  AST 12 14  ALT 12 13  ALKPHOS 76 69  BILITOT 0.4 0.4  PROT 7.0 6.4  ALBUMIN 3.2* 3.0*   No results for input(s): LIPASE, AMYLASE in the last 168 hours. No results for input(s): AMMONIA in the last 168 hours. CBC:  Recent Labs Lab 02/22/14 1723 02/23/14 0530  WBC 6.7 5.9  NEUTROABS 4.7  --   HGB 12.0 11.2*  HCT 37.0 35.6*  MCV 92.5 92.5  PLT 212 221   Cardiac Enzymes: No results for input(s): CKTOTAL, CKMB, CKMBINDEX, TROPONINI in the last 168 hours. BNP (last 3 results) No results for input(s): BNP in the last 8760 hours.  ProBNP (last 3 results)  Recent Labs  06/16/13 1524  PROBNP 7.7    CBG:  Recent Labs Lab 02/25/14 0601 02/25/14 1211 02/25/14 1734 02/25/14 2344 02/26/14 0540  GLUCAP 102* 131* 172* 170* 132*    Recent Results (from the past 240 hour(s))  Clostridium Difficile by PCR     Status: None   Collection Time: 02/23/14  8:05 AM  Result Value Ref Range Status   C difficile by pcr NEGATIVE NEGATIVE Final    Comment: Performed  at [facility]     Studies: No results found.  Scheduled Meds: . antiseptic oral rinse  7 mL Mouth Rinse q12n4p  . baclofen  10 mg Oral TID  . budesonide-formoterol  2 puff Inhalation BID  . chlorhexidine  15 mL Mouth Rinse BID  . ciprofloxacin  400 mg Intravenous Q12H  . clopidogrel  75 mg Oral Daily  . darifenacin  15 mg Oral Daily  . enoxaparin (LOVENOX) injection  40 mg Subcutaneous Q24H  . FLUoxetine  60 mg Oral Daily  . fluticasone  2 spray Each Nare Daily  . gabapentin  300 mg Oral TID  . insulin aspart  0-15 Units Subcutaneous Q6H  . insulin glargine  45 Units Subcutaneous Q12H  .  lisinopril  40 mg Oral Daily  . metronidazole  500 mg Intravenous Q8H  . potassium chloride  40 mEq Oral Once  . pravastatin  40 mg Oral q1800  . topiramate  200 mg Oral BID  . traZODone  150 mg Oral QHS  . verapamil  240 mg Oral QHS   Continuous Infusions: . sodium chloride 75 mL/hr at 02/26/14 1616     Time spent: > 35 minutes    Velvet Bathe  [facility] Pager [phone] If 7PM-7AM, please contact night-coverage at [email or web address], password [facility] 02/26/2014, 5:25 PM  LOS: 4 days

## 2014-02-26 NOTE — Telephone Encounter (Signed)
Patient called and stated that she is in the Hospital and wants a hospital bed when she comes home. Informed her to call us to make a Hospital Follow up with [name] after she is released from hospital and provider will make that determination. Patient agreed.

## 2014-02-27 ENCOUNTER — Inpatient Hospital Stay (HOSPITAL_COMMUNITY): Payer: Medicare Other

## 2014-02-27 LAB — GLUCOSE, CAPILLARY
Glucose-Capillary: 142 mg/dL — ABNORMAL HIGH (ref 70–99)
Glucose-Capillary: 157 mg/dL — ABNORMAL HIGH (ref 70–99)
Glucose-Capillary: 171 mg/dL — ABNORMAL HIGH (ref 70–99)
Glucose-Capillary: 193 mg/dL — ABNORMAL HIGH (ref 70–99)
Glucose-Capillary: 87 mg/dL (ref 70–99)
Glucose-Capillary: 73 mg/dL (ref 70–99)

## 2014-02-27 LAB — CBC
HCT: 37.1 % (ref 36.0–46.0)
Hemoglobin: 12.1 g/dL (ref 12.0–15.0)
MCH: 30 pg (ref 26.0–34.0)
MCHC: 32.6 g/dL (ref 30.0–36.0)
MCV: 91.8 fL (ref 78.0–100.0)
Platelets: 286 10*3/uL (ref 150–400)
RBC: 4.04 MIL/uL (ref 3.87–5.11)
RDW: 13.7 % (ref 11.5–15.5)
WBC: 7.3 10*3/uL (ref 4.0–10.5)

## 2014-02-27 LAB — BASIC METABOLIC PANEL
Anion gap: 9 (ref 5–15)
BUN: 5 mg/dL — ABNORMAL LOW (ref 6–23)
CO2: 17 mmol/L — ABNORMAL LOW (ref 19–32)
Calcium: 8.7 mg/dL (ref 8.4–10.5)
Chloride: 113 mmol/L — ABNORMAL HIGH (ref 96–112)
Creatinine, Ser: 0.8 mg/dL (ref 0.50–1.10)
GFR calc Af Amer: >90 mL/min (ref >90)
GFR calc non Af Amer: 85 mL/min — ABNORMAL LOW (ref >90)
Glucose, Bld: 231 mg/dL — ABNORMAL HIGH (ref 70–99)
Potassium: 4.2 mmol/L (ref 3.5–5.1)
Sodium: 139 mmol/L (ref 135–145)

## 2014-02-27 LAB — GI PATHOGEN PANEL BY PCR, STOOL
C difficile toxin A/B: NOT DETECTED
Campylobacter by PCR: NOT DETECTED
Cryptosporidium by PCR: NOT DETECTED
E coli (ETEC) LT/ST: NOT DETECTED
E coli (STEC): NOT DETECTED
E coli 0157 by PCR: NOT DETECTED
G lamblia by PCR: NOT DETECTED
Norovirus GI/GII: NOT DETECTED
Rotavirus A by PCR: NOT DETECTED
Salmonella by PCR: NOT DETECTED

## 2014-02-27 MED ORDER — IOHEXOL 300 MG/ML  SOLN
50.0000 mL | Freq: Once | INTRAMUSCULAR | Status: AC | PRN
  Administered 2014-02-27: 50 mL via ORAL

## 2014-02-27 MED ORDER — IOHEXOL 300 MG/ML  SOLN
50.0000 mL | INTRAMUSCULAR | Status: AC
  Administered 2014-02-27 (×2): 50 mL via ORAL

## 2014-02-27 MED ORDER — IOHEXOL 300 MG/ML  SOLN
100.0000 mL | Freq: Once | INTRAMUSCULAR | Status: AC | PRN
  Administered 2014-02-27: 100 mL via INTRAVENOUS

## 2014-02-27 NOTE — Progress Notes (Signed)
[facility] PROGRESS NOTE  [name] NFA:[id number] DOB: Oct 18, 1965 DOA: 02/22/2014 PCP: Lauree Chandler, NP  Brief narrative: Patient is a 49 year old that presented complaining of abdominal discomfort. Initial CT scan of abdomen and pelvis reported wall thickening and surrounding inflammatory changes that were seen in the descending colon which suggested colitis with mild thickening of the appendix. General surgery was consulted and patient treated with antibiotics and bowel rest.  Assessment/Plan: Principal Problem:   Colitis - Gen. surgery on board as there is concern about appendicitis which is less likely according to their impression. Agree with repeating CT scan. Will order - We'll continue current antibiotic regimen Cipro and Flagyl, patient currently improving on this regimen - WBC within normal limits and patient afebrile - C. difficile negative, GI pathogen panel pending  Active Problems:   GERD - Patient currently on Pepcid which I will discontinue based on principal problem    DM type 2, uncontrolled, with neuropathy - Diabetic diet, continue sliding scale insulin    Essential hypertension, benign - Continue lisinopril, verapamil    Chronic pain disorder - Continue current pain patient regimen. Currently stable  Code Status: Full Family Communication: No family at bedside Disposition Plan: Pending improvement in condition   Consultants:  General Surgery  Procedures:  None  Antibiotics:  Cipro and Flagyl  HPI/Subjective: No new complaints reported. No acute issues overnight  Objective: Filed Vitals:   02/27/14 1432  BP: 120/81  Pulse: 79  Temp: 98.4 F (36.9 C)  Resp: 18    Intake/Output Summary (Last 24 hours) at 02/27/14 1707 Last data filed at 02/27/14 1500  Gross per 24 hour  Intake   2885 ml  Output      3 ml  Net   2882 ml   Filed Weights   02/24/14 0616 02/25/14 0638 02/27/14 0610  Weight: 101.833 kg (224 lb 8 oz)  101.696 kg (224 lb 3.2 oz) 102.294 kg (225 lb 8.3 oz)    Exam:   General:  Patient in no acute distress, alert and awake  Cardiovascular: Regular rate and rhythm, no murmurs  Respiratory: Clear to auscultation bilaterally, no wheezes  Abdomen: Soft, nondistended, tenderness at left lower quadrant with deep palpation  Musculoskeletal: No cyanosis or clubbing   Data Reviewed: Basic Metabolic Panel:  Recent Labs Lab 02/22/14 1000 02/23/14 0530 02/27/14 1026  NA 139 138 139  K 3.1* 3.2* 4.2  CL 109 108 113*  CO2 23 23 17*  GLUCOSE 251* 169* 231*  BUN 13 7 5*  CREATININE 0.79 0.63 0.80  CALCIUM 8.4 8.1* 8.7   Liver Function Tests:  Recent Labs Lab 02/22/14 1000 02/23/14 0530  AST 12 14  ALT 12 13  ALKPHOS 76 69  BILITOT 0.4 0.4  PROT 7.0 6.4  ALBUMIN 3.2* 3.0*   No results for input(s): LIPASE, AMYLASE in the last 168 hours. No results for input(s): AMMONIA in the last 168 hours. CBC:  Recent Labs Lab 02/22/14 1723 02/23/14 0530 02/27/14 1026  WBC 6.7 5.9 7.3  NEUTROABS 4.7  --   --   HGB 12.0 11.2* 12.1  HCT 37.0 35.6* 37.1  MCV 92.5 92.5 91.8  PLT 212 221 286   Cardiac Enzymes: No results for input(s): CKTOTAL, CKMB, CKMBINDEX, TROPONINI in the last 168 hours. BNP (last 3 results) No results for input(s): BNP in the last 8760 hours.  ProBNP (last 3 results)  Recent Labs  06/16/13 1524  PROBNP 7.7    CBG:  Recent  Labs Lab 02/26/14 1136 02/26/14 1703 02/26/14 2330 02/27/14 0609 02/27/14 1141  GLUCAP 171* 142* 157* 87 193*    Recent Results (from the past 240 hour(s))  Clostridium Difficile by PCR     Status: None   Collection Time: 02/23/14  8:05 AM  Result Value Ref Range Status   C difficile by pcr NEGATIVE NEGATIVE Final    Comment: Performed at [facility]     Studies: No results found.  Scheduled Meds: . antiseptic oral rinse  7 mL Mouth Rinse q12n4p  . baclofen  10 mg Oral TID  . budesonide-formoterol  2  puff Inhalation BID  . chlorhexidine  15 mL Mouth Rinse BID  . ciprofloxacin  400 mg Intravenous Q12H  . clopidogrel  75 mg Oral Daily  . darifenacin  15 mg Oral Daily  . enoxaparin (LOVENOX) injection  40 mg Subcutaneous Q24H  . FLUoxetine  60 mg Oral Daily  . fluticasone  2 spray Each Nare Daily  . gabapentin  300 mg Oral TID  . insulin aspart  0-15 Units Subcutaneous Q6H  . insulin glargine  45 Units Subcutaneous Q12H  . lisinopril  40 mg Oral Daily  . metronidazole  500 mg Intravenous Q8H  . potassium chloride  40 mEq Oral Once  . pravastatin  40 mg Oral q1800  . topiramate  200 mg Oral BID  . traZODone  150 mg Oral QHS  . verapamil  240 mg Oral QHS   Continuous Infusions: . sodium chloride 75 mL/hr at 02/27/14 1103     Time spent: > 35 minutes    Velvet Bathe  [facility] Pager [phone] If 7PM-7AM, please contact night-coverage at [email or web address], password [facility] 02/27/2014, 5:07 PM  LOS: 5 days

## 2014-02-27 NOTE — Progress Notes (Signed)
RT placed CPAP on patient. Patient setting is auto 5-20 cmH2O. Sterile water was added to water chamber for humidification. Patient is tolerating well.

## 2014-02-27 NOTE — Progress Notes (Signed)
RT placed patient on CPAP. Sterile water added to water chamber for humidification. Patient is tolerating well. RT will continue to assess and monitor as needed.

## 2014-02-27 NOTE — Progress Notes (Signed)
  Subjective: Still complains of LLQ pain. No fever. Normal wbc  Objective: Vital signs in last 24 hours: Temp:  [98.2 F (36.8 C)-98.5 F (36.9 C)] 98.5 F (36.9 C) (02/09 0610) Pulse Rate:  [75-86] 86 (02/09 0610) Resp:  [16-20] 16 (02/09 0610) BP: (111-120)/(69-75) 111/75 mmHg (02/09 0610) SpO2:  [98 %-100 %] 98 % (02/09 0834) Weight:  [225 lb 8.3 oz (102.294 kg)] 225 lb 8.3 oz (102.294 kg) (02/09 0610) Last BM Date: 02/23/14  Intake/Output from previous day: 02/08 0701 - 02/09 0700 In: 1670 [I.V.:1070; IV Piggyback:600] Out: 2 [Urine:2] Intake/Output this shift:    Resp: clear to auscultation bilaterally Cardio: regular rate and rhythm GI: soft, tender in LLQ  Lab Results:   Recent Labs  02/27/14 1026  WBC 7.3  HGB 12.1  HCT 37.1  PLT 286   BMET  Recent Labs  02/27/14 1026  NA 139  K 4.2  CL 113*  CO2 17*  GLUCOSE 231*  BUN 5*  CREATININE 0.80  CALCIUM 8.7   PT/INR No results for input(s): LABPROT, INR in the last 72 hours. ABG No results for input(s): PHART, HCO3 in the last 72 hours.  Invalid input(s): PCO2, PO2  Studies/Results: No results found.  Anti-infectives: Anti-infectives    Start     Dose/Rate Route Frequency Ordered Stop   02/23/14 0400  metroNIDAZOLE (FLAGYL) IVPB 500 mg     500 mg100 mL/hr over 60 Minutes Intravenous Every 8 hours 02/22/14 2044     02/22/14 1900  ciprofloxacin (CIPRO) IVPB 400 mg     400 mg200 mL/hr over 60 Minutes Intravenous  Once 02/22/14 1845 02/22/14 2010   02/22/14 1900  metroNIDAZOLE (FLAGYL) IVPB 500 mg     500 mg100 mL/hr over 60 Minutes Intravenous  Once 02/22/14 1845 02/22/14 2010   02/22/14 1900  ciprofloxacin (CIPRO) IVPB 400 mg     400 mg200 mL/hr over 60 Minutes Intravenous Every 12 hours 02/22/14 1846     02/22/14 1900  metroNIDAZOLE (FLAGYL) IVPB 500 mg  Status:  Discontinued     500 mg100 mL/hr over 60 Minutes Intravenous Every 8 hours 02/22/14 1846 02/23/14 0550       Assessment/Plan: s/p * No surgery found * Would rest bowel and not feed until pain resolves then advance diet  Continue antibiotics Will get CT today to evaluate diverticulitis  LOS: 5 days    TOTH III,PAUL S 02/27/2014

## 2014-02-28 LAB — BASIC METABOLIC PANEL
Anion gap: 6 (ref 5–15)
BUN: 6 mg/dL (ref 6–23)
CO2: 22 mmol/L (ref 19–32)
Calcium: 8.8 mg/dL (ref 8.4–10.5)
Chloride: 109 mmol/L (ref 96–112)
Creatinine, Ser: 0.81 mg/dL (ref 0.50–1.10)
GFR calc Af Amer: >90 mL/min (ref >90)
GFR calc non Af Amer: 84 mL/min — ABNORMAL LOW (ref >90)
Glucose, Bld: 88 mg/dL (ref 70–99)
Potassium: 3.5 mmol/L (ref 3.5–5.1)
Sodium: 137 mmol/L (ref 135–145)

## 2014-02-28 LAB — GLUCOSE, CAPILLARY
Glucose-Capillary: 101 mg/dL — ABNORMAL HIGH (ref 70–99)
Glucose-Capillary: 81 mg/dL (ref 70–99)
Glucose-Capillary: 160 mg/dL — ABNORMAL HIGH (ref 70–99)
Glucose-Capillary: 108 mg/dL — ABNORMAL HIGH (ref 70–99)
Glucose-Capillary: 126 mg/dL — ABNORMAL HIGH (ref 70–99)

## 2014-02-28 MED ORDER — INSULIN ASPART 100 UNIT/ML ~~LOC~~ SOLN
0.0000 [IU] | Freq: Three times a day (TID) | SUBCUTANEOUS | Status: DC
  Administered 2014-03-01: 1 [IU] via SUBCUTANEOUS

## 2014-02-28 NOTE — Progress Notes (Signed)
Pt placed on CPAP QHS.  Machine plugged into red outlet, humidifier filled with sterile water, Pt using room air, and settings of automode with minimum of 5cm H2O and maximum of 20cm H2O.  Pt stable and comfortable.

## 2014-02-28 NOTE — Progress Notes (Signed)
Subjective: She has no complaints, no pain.    Objective: Vital signs in last 24 hours: Temp:  [98.2 F (36.8 C)-98.4 F (36.9 C)] 98.2 F (36.8 C) (02/10 0546) Pulse Rate:  [61-79] 64 (02/10 0546) Resp:  [18] 18 (02/10 0546) BP: (104-120)/(67-81) 104/68 mmHg (02/10 0546) SpO2:  [99 %-100 %] 99 % (02/10 0546) Last BM Date: 02/23/14 480 PO  Full liquids Afebrile, VSS BMP OK CT scan shows no abscess or explanation for RUQ pain, with resolved colitis.  Normal appearing appendix, with appendicolith, no appendicitis. Intake/Output from previous day: 02/09 0701 - 02/10 0700 In: 2981.3 [P.O.:480; I.V.:1801.3; IV Piggyback:700] Out: 3 [Urine:3] Intake/Output this shift:    General appearance: alert, cooperative and no distress GI: soft, non-tender; bowel sounds normal; no masses,  no organomegaly  Lab Results:   Recent Labs  02/27/14 1026  WBC 7.3  HGB 12.1  HCT 37.1  PLT 286    BMET  Recent Labs  02/27/14 1026 02/28/14 0615  NA 139 137  K 4.2 3.5  CL 113* 109  CO2 17* 22  GLUCOSE 231* 88  BUN 5* 6  CREATININE 0.80 0.81  CALCIUM 8.7 8.8   PT/INR No results for input(s): LABPROT, INR in the last 72 hours.   Recent Labs Lab 02/22/14 1000 02/23/14 0530  AST 12 14  ALT 12 13  ALKPHOS 76 69  BILITOT 0.4 0.4  PROT 7.0 6.4  ALBUMIN 3.2* 3.0*     Lipase     Component Value Date/Time   LIPASE 29 02/06/2013 1810     Studies/Results: Ct Abdomen Pelvis W Contrast  02/27/2014   CLINICAL DATA:  Kidney stones and colitis. Right upper quadrant pain, evaluate for abscess.  EXAM: CT ABDOMEN AND PELVIS WITH CONTRAST  TECHNIQUE: Multidetector CT imaging of the abdomen and pelvis was performed using the standard protocol following bolus administration of intravenous contrast.  CONTRAST:  123m OMNIPAQUE IOHEXOL 300 MG/ML SOLN, 51mOMNIPAQUE IOHEXOL 300 MG/ML SOLN  COMPARISON:  02/22/2014  FINDINGS: BODY WALL: Small periumbilical hernia.  LOWER CHEST: Small,  layering pleural effusions similar to prior. There is associated subsegmental atelectasis.  ABDOMEN/PELVIS:  Liver: No focal abnormality.  Biliary: No evidence of biliary obstruction or stone.  Pancreas: Unremarkable.  Spleen: Unremarkable.  Adrenals: 2 cm nodule in the right adrenal gland which is stable over years and consistent with adenoma.  Kidneys and ureters: Numerous, nonobstructive bilateral renal calculi. Largest stone or stone conglomerate is in the right lower pole measuring 7 mm and associated with mild cortical scarring. No hydronephrosis or ureteral calculus  Bladder: Moderate distention of the bladder.  Reproductive: Hysterectomy. There is a corpus luteum cyst noted on the left.  Bowel: Colitis have resolved. There is no obstruction, perforation, or abscess. Mild distal colonic diverticulosis. Proximal appendix is distended by appendicoliths, but there is no acute inflammatory change or progression.  Retroperitoneum: No mass or adenopathy.  Peritoneum: No ascites or pneumoperitoneum.  Vascular: No acute abnormality.  OSSEOUS: No acute abnormalities. Prominent spurring or calcified disc at L1-2 and T12-L1 with canal stenosis.  IMPRESSION: 1. No abscess or other explanation for right upper quadrant pain. 2. Resolved colitis. 3. Stable appearance of the appendix with appendicolith. No acute appendicitis. 4. Extensive nonobstructive nephrolithiasis.   Electronically Signed   By: JoMonte Fantasia.D.   On: 02/27/2014 19:04    Medications: . antiseptic oral rinse  7 mL Mouth Rinse q12n4p  . baclofen  10 mg Oral TID  . budesonide-formoterol  2 puff Inhalation BID  . chlorhexidine  15 mL Mouth Rinse BID  . ciprofloxacin  400 mg Intravenous Q12H  . clopidogrel  75 mg Oral Daily  . darifenacin  15 mg Oral Daily  . enoxaparin (LOVENOX) injection  40 mg Subcutaneous Q24H  . FLUoxetine  60 mg Oral Daily  . fluticasone  2 spray Each Nare Daily  . gabapentin  300 mg Oral TID  . insulin aspart  0-15  Units Subcutaneous Q6H  . insulin glargine  45 Units Subcutaneous Q12H  . lisinopril  40 mg Oral Daily  . metronidazole  500 mg Intravenous Q8H  . potassium chloride  40 mEq Oral Once  . pravastatin  40 mg Oral q1800  . topiramate  200 mg Oral BID  . traZODone  150 mg Oral QHS  . verapamil  240 mg Oral QHS    Assessment/Plan Abdominal pain Colitis/possible appendicitis (day 4 Cipro/Flagyl completed yesterday) CAD (daily Plavix)  COPD  Chronic pain  AODM hX OF SEIZURES OSA Hx of migraines Blind left eye Hx of anxiety and depression Body mass index is 36.2  Lovenox for DVT   Plan:  Diabetic diet, 1 more day of IV antibiotics, 1 week of oral antibiotics.       LOS: 5 days    [name] 02/28/2014

## 2014-02-28 NOTE — Progress Notes (Signed)
[facility] PROGRESS NOTE  [name] DOB: 17-Dec-1965 DOA: 02/22/2014 PCP: Karen Chandler, NP  Brief narrative: Patient is a 49 year old that presented complaining of abdominal discomfort. Initial CT scan of abdomen and pelvis reported wall thickening and surrounding inflammatory changes that were seen in the descending colon which suggested colitis with mild thickening of the appendix. General surgery was consulted and patient treated with antibiotics and bowel rest.  Assessment/Plan: Principal Problem:   Colitis/possible appendicitis - Gen. surgery on board as there is concern about appendicitis which is less likely according to their impression. Repeat CT scan 02/27/14 without colitis or appendicitis. - We'll continue current antibiotic regimen Cipro and Flagyl, patient currently improving on this regimen. As per CCS, continue additional day of IV antibiotics and then discharged on oral antibiotics for a week. - C. difficile negative, GI pathogen panel pending - Improved.  Active Problems:   GERD - Stable    DM type 2, uncontrolled, with neuropathy - Diabetic diet, continue sliding scale insulin. Controlled    Essential hypertension, benign - Continue lisinopril, verapamil. Controlled    Chronic pain disorder - Continue current pain patient regimen. Currently stable  Code Status: Full Family Communication: No family at bedside Disposition Plan: Possible discharge to/11   Consultants:  General Surgery  Procedures:  None  Antibiotics:  Cipro and Flagyl  HPI/Subjective: Patient states that her abdominal pain has definitely improved since admission. Denies p.m. Flatus +. Tolerating diet without nausea or vomiting.  Objective: Filed Vitals:   02/27/14 2130 02/28/14 0546 02/28/14 1223 02/28/14 1300  BP: 120/67 104/68  121/65  Pulse: 61 64  80  Temp: 98.2 F (36.8 C) 98.2 F (36.8 C)  98 F (36.7 C)  TempSrc: Oral   Oral  Resp: 18 18   18   Height:      Weight:      SpO2: 100% 99% 99% 100%      Intake/Output Summary (Last 24 hours) at 02/28/14 1851 Last data filed at 02/28/14 1741  Gross per 24 hour  Intake 2486.25 ml  Output      5 ml  Net 2481.25 ml   Filed Weights   02/24/14 0616 02/25/14 0638 02/27/14 0610  Weight: 101.833 kg (224 lb 8 oz) 101.696 kg (224 lb 3.2 oz) 102.294 kg (225 lb 8.3 oz)    Exam:   General:  Patient in no acute distress, alert and awake  Cardiovascular: Regular rate and rhythm, no murmurs  Respiratory: Clear to auscultation bilaterally, no wheezes  Abdomen: Soft, nondistended, tenderness at left lower quadrant with deep palpation  Musculoskeletal: No cyanosis or clubbing   Data Reviewed: Basic Metabolic Panel:  Recent Labs Lab 02/22/14 1000 02/23/14 0530 02/27/14 1026 02/28/14 0615  NA 139 138 139 137  K 3.1* 3.2* 4.2 3.5  CL 109 108 113* 109  CO2 23 23 17* 22  GLUCOSE 251* 169* 231* 88  BUN 13 7 5* 6  CREATININE 0.79 0.63 0.80 0.81  CALCIUM 8.4 8.1* 8.7 8.8   Liver Function Tests:  Recent Labs Lab 02/22/14 1000 02/23/14 0530  AST 12 14  ALT 12 13  ALKPHOS 76 69  BILITOT 0.4 0.4  PROT 7.0 6.4  ALBUMIN 3.2* 3.0*   No results for input(s): LIPASE, AMYLASE in the last 168 hours. No results for input(s): AMMONIA in the last 168 hours. CBC:  Recent Labs Lab 02/22/14 1723 02/23/14 0530 02/27/14 1026  WBC 6.7 5.9 7.3  NEUTROABS 4.7  --   --  HGB 12.0 11.2* 12.1  HCT 37.0 35.6* 37.1  MCV 92.5 92.5 91.8  PLT 212 221 286   Cardiac Enzymes: No results for input(s): CKTOTAL, CKMB, CKMBINDEX, TROPONINI in the last 168 hours. BNP (last 3 results) No results for input(s): BNP in the last 8760 hours.  ProBNP (last 3 results)  Recent Labs  06/16/13 1524  PROBNP 7.7    CBG:  Recent Labs Lab 02/27/14 1816 02/28/14 0003 02/28/14 0618 02/28/14 1141 02/28/14 1822  GLUCAP 73 101* 81 160* 108*    Recent Results (from the past 240 hour(s))   Clostridium Difficile by PCR     Status: None   Collection Time: 02/23/14  8:05 AM  Result Value Ref Range Status   C difficile by pcr NEGATIVE NEGATIVE Final    Comment: Performed at [facility]     Studies: Ct Abdomen Pelvis W Contrast  02/27/2014   CLINICAL DATA:  Kidney stones and colitis. Right upper quadrant pain, evaluate for abscess.  EXAM: CT ABDOMEN AND PELVIS WITH CONTRAST  TECHNIQUE: Multidetector CT imaging of the abdomen and pelvis was performed using the standard protocol following bolus administration of intravenous contrast.  CONTRAST:  14m OMNIPAQUE IOHEXOL 300 MG/ML SOLN, 519mOMNIPAQUE IOHEXOL 300 MG/ML SOLN  COMPARISON:  02/22/2014  FINDINGS: BODY WALL: Small periumbilical hernia.  LOWER CHEST: Small, layering pleural effusions similar to prior. There is associated subsegmental atelectasis.  ABDOMEN/PELVIS:  Liver: No focal abnormality.  Biliary: No evidence of biliary obstruction or stone.  Pancreas: Unremarkable.  Spleen: Unremarkable.  Adrenals: 2 cm nodule in the right adrenal gland which is stable over years and consistent with adenoma.  Kidneys and ureters: Numerous, nonobstructive bilateral renal calculi. Largest stone or stone conglomerate is in the right lower pole measuring 7 mm and associated with mild cortical scarring. No hydronephrosis or ureteral calculus  Bladder: Moderate distention of the bladder.  Reproductive: Hysterectomy. There is a corpus luteum cyst noted on the left.  Bowel: Colitis have resolved. There is no obstruction, perforation, or abscess. Mild distal colonic diverticulosis. Proximal appendix is distended by appendicoliths, but there is no acute inflammatory change or progression.  Retroperitoneum: No mass or adenopathy.  Peritoneum: No ascites or pneumoperitoneum.  Vascular: No acute abnormality.  OSSEOUS: No acute abnormalities. Prominent spurring or calcified disc at L1-2 and T12-L1 with canal stenosis.  IMPRESSION: 1. No abscess or other  explanation for right upper quadrant pain. 2. Resolved colitis. 3. Stable appearance of the appendix with appendicolith. No acute appendicitis. 4. Extensive nonobstructive nephrolithiasis.   Electronically Signed   By: JoMonte Fantasia.D.   On: 02/27/2014 19:04    Scheduled Meds: . antiseptic oral rinse  7 mL Mouth Rinse q12n4p  . baclofen  10 mg Oral TID  . budesonide-formoterol  2 puff Inhalation BID  . chlorhexidine  15 mL Mouth Rinse BID  . ciprofloxacin  400 mg Intravenous Q12H  . clopidogrel  75 mg Oral Daily  . darifenacin  15 mg Oral Daily  . enoxaparin (LOVENOX) injection  40 mg Subcutaneous Q24H  . FLUoxetine  60 mg Oral Daily  . fluticasone  2 spray Each Nare Daily  . gabapentin  300 mg Oral TID  . insulin aspart  0-15 Units Subcutaneous Q6H  . insulin glargine  45 Units Subcutaneous Q12H  . lisinopril  40 mg Oral Daily  . metronidazole  500 mg Intravenous Q8H  . potassium chloride  40 mEq Oral Once  . pravastatin  40 mg Oral q1800  .  topiramate  200 mg Oral BID  . traZODone  150 mg Oral QHS  . verapamil  240 mg Oral QHS   Continuous Infusions: . sodium chloride 75 mL/hr at 02/28/14 3545     Time spent: 20 minutes   Dory Verdun, MD, FACP, FHM. [facility] Pager [phone]  If 7PM-7AM, please contact night-coverage [email or web address] Password [email or web address] 02/28/2014, 6:55 PM    LOS: 5 days

## 2014-03-01 LAB — GLUCOSE, CAPILLARY
Glucose-Capillary: 114 mg/dL — ABNORMAL HIGH (ref 70–99)
Glucose-Capillary: 121 mg/dL — ABNORMAL HIGH (ref 70–99)
Glucose-Capillary: 105 mg/dL — ABNORMAL HIGH (ref 70–99)

## 2014-03-01 MED ORDER — BISACODYL 10 MG RE SUPP
10.0000 mg | Freq: Once | RECTAL | Status: AC
  Administered 2014-03-01: 10 mg via RECTAL
  Filled 2014-03-01: qty 1

## 2014-03-01 MED ORDER — CIPROFLOXACIN HCL 500 MG PO TABS: 500.0000 mg | ORAL_TABLET | Freq: Two times a day (BID) | ORAL | Status: DC

## 2014-03-01 MED ORDER — TRAZODONE HCL 150 MG PO TABS: 300.0000 mg | ORAL_TABLET | Freq: Every day | ORAL | Status: DC

## 2014-03-01 MED ORDER — POLYETHYLENE GLYCOL 3350 17 G PO PACK: 17.0000 g | PACK | Freq: Every day | ORAL | Status: DC

## 2014-03-01 MED ORDER — INSULIN LISPRO 200 UNIT/ML ~~LOC~~ SOPN: 5.0000 [IU] | PEN_INJECTOR | Freq: Three times a day (TID) | SUBCUTANEOUS | Status: DC

## 2014-03-01 MED ORDER — METRONIDAZOLE 500 MG PO TABS: 500.0000 mg | ORAL_TABLET | Freq: Three times a day (TID) | ORAL | Status: DC

## 2014-03-01 MED ORDER — INSULIN GLARGINE 300 UNIT/ML ~~LOC~~ SOPN: 45.0000 [IU] | PEN_INJECTOR | Freq: Two times a day (BID) | SUBCUTANEOUS | Status: DC

## 2014-03-01 NOTE — Progress Notes (Signed)
Subjective: Tolerating diet, no Bm biggest issue now.  No abdominal pain.  Objective: Vital signs in last 24 hours: Temp:  [97.5 F (36.4 C)-98.3 F (36.8 C)] 97.5 F (36.4 C) (02/11 0518) Pulse Rate:  [66-80] 66 (02/11 0518) Resp:  [18] 18 (02/11 0518) BP: (113-157)/(65-81) 113/70 mmHg (02/11 0518) SpO2:  [93 %-100 %] 98 % (02/11 0518) Last BM Date: 02/23/14 720 PO  Carb modified diet. No BM 5 ml urine recorded Afebrile, VSS No labs Intake/Output from previous day: 02/10 0701 - 02/11 0700 In: 1220 [P.O.:720; IV Piggyback:500] Out: 5 [Urine:5] Intake/Output this shift:    General appearance: alert, cooperative and no distress GI: soft, non-tender; bowel sounds normal; no masses,  no organomegaly  Lab Results:   Recent Labs  02/27/14 1026  WBC 7.3  HGB 12.1  HCT 37.1  PLT 286    BMET  Recent Labs  02/27/14 1026 02/28/14 0615  NA 139 137  K 4.2 3.5  CL 113* 109  CO2 17* 22  GLUCOSE 231* 88  BUN 5* 6  CREATININE 0.80 0.81  CALCIUM 8.7 8.8   PT/INR No results for input(s): LABPROT, INR in the last 72 hours.   Recent Labs Lab 02/23/14 0530  AST 14  ALT 13  ALKPHOS 69  BILITOT 0.4  PROT 6.4  ALBUMIN 3.0*     Lipase     Component Value Date/Time   LIPASE 29 02/06/2013 1810     Studies/Results: Ct Abdomen Pelvis W Contrast  02/27/2014   CLINICAL DATA:  Kidney stones and colitis. Right upper quadrant pain, evaluate for abscess.  EXAM: CT ABDOMEN AND PELVIS WITH CONTRAST  TECHNIQUE: Multidetector CT imaging of the abdomen and pelvis was performed using the standard protocol following bolus administration of intravenous contrast.  CONTRAST:  156m OMNIPAQUE IOHEXOL 300 MG/ML SOLN, 564mOMNIPAQUE IOHEXOL 300 MG/ML SOLN  COMPARISON:  02/22/2014  FINDINGS: BODY WALL: Small periumbilical hernia.  LOWER CHEST: Small, layering pleural effusions similar to prior. There is associated subsegmental atelectasis.  ABDOMEN/PELVIS:  Liver: No focal abnormality.   Biliary: No evidence of biliary obstruction or stone.  Pancreas: Unremarkable.  Spleen: Unremarkable.  Adrenals: 2 cm nodule in the right adrenal gland which is stable over years and consistent with adenoma.  Kidneys and ureters: Numerous, nonobstructive bilateral renal calculi. Largest stone or stone conglomerate is in the right lower pole measuring 7 mm and associated with mild cortical scarring. No hydronephrosis or ureteral calculus  Bladder: Moderate distention of the bladder.  Reproductive: Hysterectomy. There is a corpus luteum cyst noted on the left.  Bowel: Colitis have resolved. There is no obstruction, perforation, or abscess. Mild distal colonic diverticulosis. Proximal appendix is distended by appendicoliths, but there is no acute inflammatory change or progression.  Retroperitoneum: No mass or adenopathy.  Peritoneum: No ascites or pneumoperitoneum.  Vascular: No acute abnormality.  OSSEOUS: No acute abnormalities. Prominent spurring or calcified disc at L1-2 and T12-L1 with canal stenosis.  IMPRESSION: 1. No abscess or other explanation for right upper quadrant pain. 2. Resolved colitis. 3. Stable appearance of the appendix with appendicolith. No acute appendicitis. 4. Extensive nonobstructive nephrolithiasis.   Electronically Signed   By: JoMonte Fantasia.D.   On: 02/27/2014 19:04    Medications: . antiseptic oral rinse  7 mL Mouth Rinse q12n4p  . baclofen  10 mg Oral TID  . bisacodyl  10 mg Rectal Once  . budesonide-formoterol  2 puff Inhalation BID  . chlorhexidine  15 mL Mouth Rinse  BID  . ciprofloxacin  400 mg Intravenous Q12H  . clopidogrel  75 mg Oral Daily  . darifenacin  15 mg Oral Daily  . enoxaparin (LOVENOX) injection  40 mg Subcutaneous Q24H  . FLUoxetine  60 mg Oral Daily  . fluticasone  2 spray Each Nare Daily  . gabapentin  300 mg Oral TID  . insulin aspart  0-9 Units Subcutaneous TID WC  . insulin glargine  45 Units Subcutaneous Q12H  . lisinopril  40 mg Oral Daily   . metronidazole  500 mg Intravenous Q8H  . pravastatin  40 mg Oral q1800  . topiramate  200 mg Oral BID  . traZODone  150 mg Oral QHS  . verapamil  240 mg Oral QHS    Assessment/Plan Abdominal pain Colitis/possible appendicitis (day 4 Cipro/Flagyl completed yesterday) CAD (daily Plavix)  COPD  Chronic pain  AODM hX OF SEIZURES OSA Hx of migraines Blind left eye Hx of anxiety and depression Body mass index is 36.2  Lovenox for DVT    Plan:  We will see again as needed.  Agree with treatment for chronic constipation before she goes.  1 More week of oral antibiotics, for ill defined colitis.  LOS: 6 days    [name] 03/01/2014

## 2014-03-01 NOTE — Discharge Instructions (Signed)
Colitis Colitis is inflammation of the colon. Colitis can be a short-term or long-standing (chronic) illness. Crohn's disease and ulcerative colitis are 2 types of colitis which are chronic. They usually require lifelong treatment. CAUSES  There are many different causes of colitis, including:  Viruses.  Germs (bacteria).  Medicine reactions. SYMPTOMS   Diarrhea.  Intestinal bleeding.  Pain.  Fever.  Throwing up (vomiting).  Tiredness (fatigue).  Weight loss.  Bowel blockage. DIAGNOSIS  The diagnosis of colitis is based on examination and stool or blood tests. X-rays, CT scan, and colonoscopy may also be needed. TREATMENT  Treatment may include:  Fluids given through the vein (intravenously).  Bowel rest (nothing to eat or drink for a period of time).  Medicine for pain and diarrhea.  Medicines (antibiotics) that kill germs.  Cortisone medicines.  Surgery. HOME CARE INSTRUCTIONS   Get plenty of rest.  Drink enough water and fluids to keep your urine clear or pale yellow.  Eat a well-balanced diet.  Call your caregiver for follow-up as recommended. SEEK IMMEDIATE MEDICAL CARE IF:   You develop chills.  You have an oral temperature above 102 F (38.9 C), not controlled by medicine.  You have extreme weakness, fainting, or dehydration.  You have repeated vomiting.  You develop severe belly (abdominal) pain or are passing bloody or tarry stools. MAKE SURE YOU:   Understand these instructions.  Will watch your condition.  Will get help right away if you are not doing well or get worse. Document Released: 02/13/2004 Document Revised: 03/30/2011 Document Reviewed: 05/10/2009 [facility] Patient Information 2015 [place], [place]. This information is not intended to replace advice given to you by your health care provider. Make sure you discuss any questions you have with your health care provider.

## 2014-03-01 NOTE — Discharge Summary (Signed)
Physician Discharge Summary  [name]:[id number] DOB: [birth year] DOA: 02/22/2014  PCP: Lauree Chandler, NP  Admit date: 02/22/2014 Discharge date: 03/01/2014  Time spent: Less than 30 minutes  Recommendations for Outpatient Follow-up:  1. Sherrie Mustache, NP/PCP in 4 days. To be seen with repeat labs (CBC & BMP). Will need adjustment of diabetic medications. Also to get a tetanus booster shot as outpatient.  Discharge Diagnoses:  Principal Problem:   Colitis Active Problems:   GERD   Obesity, Class III, BMI 40-49.9 (morbid obesity)   DM type 2, uncontrolled, with neuropathy   Essential hypertension, benign   Chronic pain disorder   Discharge Condition: Improved & Stable  Diet recommendation: Heart healthy and diabetic diet.  Filed Weights   02/24/14 0616 02/25/14 0638 02/27/14 0610  Weight: 101.833 kg (224 lb 8 oz) 101.696 kg (224 lb 3.2 oz) 102.294 kg (225 lb 8.3 oz)    History of present illness:  Patient is a 49 year old that presented complaining of abdominal discomfort. Initial CT scan of abdomen and pelvis reported wall thickening and surrounding inflammatory changes that were seen in the descending colon which suggested colitis with mild thickening of the appendix. General surgery was consulted and patient treated with antibiotics and bowel rest.  Hospital Course:   1. Colitis/shigella + by PCR: Patient was treated with IV Cipro and Flagyl. C. difficile PCR negative. GI pathogen panel positive for Shigella-Cipro should cover. There was initial concern for appendicitis and hence surgeons were consulted and evaluated patient. Follow-up CT showed colitis had resolved, normal-appearing appendix with appendicolith and no appendicitis. She was discharged to complete additional week of Cipro and Flagyl. Improved-no nausea, vomiting, abdominal pain or diarrhea at discharge. D/W ID on 2/15: shigella diarrhea usually self limiting but appropriately on Cipro. 2. GERD:  Stable 3. Uncontrolled type II DM with neuropathy: She was placed on reduced dose of Lantus in the hospital despite which her CBGs ranged in the low 100s. At discharge, she was advised to continue the reduced dose Lantus compared to higher dose prior to admission and advised to follow-up with PCP regarding titrating back up depending on her home CBG checks. She verbalized understanding. 4. Essential hypertension: Controlled. Continue lisinopril and verapamil. 5. History of chronic pain: Controlled 6. Hypokalemia: Replaced 7. History of epilepsy: Continue topiramate. No recent reported seizures. 8. OSA 9. History of anxiety & depression 10. History of HLD: Continue statins  Consultations:  General surgery  Procedures:  None    Discharge Exam:  Complaints:  Denied complaints on day of discharge. No nausea, vomiting, abdominal pain. Tolerated diet. Stated that she did not have a BM for 4 days but was able to have a BM after Dulcolax suppository.  Filed Vitals:   02/28/14 2123 02/28/14 2131 03/01/14 0518 03/01/14 1346  BP: 120/81 157/73 113/70 132/79  Pulse: 74 74 66 69  Temp: 98.3 F (36.8 C) 97.6 F (36.4 C) 97.5 F (36.4 C) 98.1 F (36.7 C)  TempSrc: Oral Oral Oral Oral  Resp: 18 18 18 20   Height:      Weight:      SpO2: 99% 93% 98% 99%     General: Pleasant obese female sitting up comfortably in bed.  Cardiovascular: S1 and S2, RRR. No JVD, murmurs, gallops, clicks or pedal edema  Respiratory: Clear to auscultation bilaterally. No increased work of breathing.  Abdomen: Soft, nondistended and nontender. Normal bowel sounds heard.  Musculoskeletal: No cyanosis or clubbing  CNS: Alert and oriented. No focal  neurological deficits.  Discharge Instructions      Discharge Instructions    Call MD for:  persistant nausea and vomiting    Complete by:  As directed      Call MD for:  severe uncontrolled pain    Complete by:  As directed      Call MD for:   temperature >100.4    Complete by:  As directed      Diet - low sodium heart healthy    Complete by:  As directed      Diet Carb Modified    Complete by:  As directed      Increase activity slowly    Complete by:  As directed             Medication List    STOP taking these medications        amoxicillin 500 MG capsule  Commonly known as:  AMOXIL     cetirizine 10 MG tablet  Commonly known as:  ZYRTEC      TAKE these medications        baclofen 10 MG tablet  Commonly known as:  LIORESAL  TAKE 1 TABLET BY MOUTH THREE TIMES A DAY AS NEEDED FOR MUSCLE SPASMS     ciprofloxacin 500 MG tablet  Commonly known as:  CIPRO  Take 1 tablet (500 mg total) by mouth 2 (two) times daily.     clopidogrel 75 MG tablet  Commonly known as:  PLAVIX  TAKE 1 TABLET BY MOUTH EVERY DAY     EASY TOUCH PEN NEEDLES 31G X 8 MM Misc  Generic drug:  Insulin Pen Needle  USE AS DIRECTED TO INJECT INSULIN DAILY     FLUoxetine 20 MG capsule  Commonly known as:  PROZAC  Take 3 capsules (60 mg total) by mouth daily at 12 noon.     fluticasone 50 MCG/ACT nasal spray  Commonly known as:  FLONASE  Place 2 sprays into both nostrils daily.     gabapentin 300 MG capsule  Commonly known as:  NEURONTIN  TAKE 2 CAPSULES BY MOUTH THREE TIMES A DAY     ibuprofen 600 MG tablet  Commonly known as:  ADVIL,MOTRIN  Take one tablet by mouth every 8 hours as needed for pain     Insulin Glargine 300 UNIT/ML Sopn  Commonly known as:  TOUJEO SOLOSTAR  Inject 45 Units into the skin 2 (two) times daily.     Insulin Lispro (Human) 200 UNIT/ML Sopn  Commonly known as:  HUMALOG KWIKPEN  Inject 5 Units into the skin 3 (three) times daily with meals.     lisinopril 40 MG tablet  Commonly known as:  PRINIVIL,ZESTRIL  Take 1 tablet (40 mg total) by mouth daily.     metroNIDAZOLE 500 MG tablet  Commonly known as:  FLAGYL  Take 1 tablet (500 mg total) by mouth 3 (three) times daily.     ONE TOUCH ULTRA TEST test  strip  Generic drug:  glucose blood  USE TO CHECK BLOOD SUGAR FOUR TIMES A DAY     ONETOUCH DELICA LANCETS 40H Misc  TEST BLOOD SUGAR FOUR TIMES A DAY     pantoprazole 40 MG tablet  Commonly known as:  PROTONIX  TAKE 1 TABLET BY MOUTH DAILY     polyethylene glycol packet  Commonly known as:  MIRALAX  Take 17 g by mouth daily.     PROAIR HFA 108 (90 BASE) MCG/ACT inhaler  Generic drug:  albuterol  INHALE 2 PUFFS BY MOUTH EVERY 6 HOURS AS NEEDED FOR WHEEZING OR SHORTNESS OF BREATH     ranitidine 150 MG tablet  Commonly known as:  ZANTAC  TAKE 1 TABLET BY MOUTH TWICE DAILY     simvastatin 20 MG tablet  Commonly known as:  ZOCOR  TAKE 1 TABLET BY MOUTH EVERY MORNING FOR CHOLESTROL     solifenacin 10 MG tablet  Commonly known as:  VESICARE  Take 1 tablet (10 mg total) by mouth daily.     SYMBICORT 160-4.5 MCG/ACT inhaler  Generic drug:  budesonide-formoterol  INHALE 2 PUFFS BY MOUTH TWICE DAILY     Tdap 5-2.5-18.5 LF-MCG/0.5 injection  Commonly known as:  BOOSTRIX  Inject 0.5 mLs into the muscle once.     topiramate 200 MG tablet  Commonly known as:  TOPAMAX  TAKE 1 TABLET BY MOUTH TWICE DAILY     traZODone 150 MG tablet  Commonly known as:  DESYREL  Take 2 tablets (300 mg total) by mouth at bedtime.     verapamil 240 MG 24 hr capsule  Commonly known as:  VERELAN PM  TAKE 1 CAPSULE BY MOUTH EVERY NIGHT AT BEDTIME FOR BLOOD PRESSURE     Vitamin D (Ergocalciferol) 50000 UNITS Caps capsule  Commonly known as:  DRISDOL  Take 50,000 Units by mouth every Thursday.     zolpidem 10 MG tablet  Commonly known as:  AMBIEN  Take 1 tablet (10 mg total) by mouth at bedtime as needed for sleep.       Follow-up Information    Follow up with Dewaine Oats, JESSICA K, NP. Schedule an appointment as soon as possible for a visit in 4 days.   Specialty:  Nurse Practitioner   Why:  To be seen with repeat labs (CBC & BMP). Will need adjustment of diabetic medications. Also to get a  tetanus booster shot as outpatient.   Contact information:   [address] [place] [address] [phone]        The results of significant diagnostics from this hospitalization (including imaging, microbiology, ancillary and laboratory) are listed below for reference.    Significant Diagnostic Studies: Ct Abdomen Pelvis W Contrast  02/27/2014   CLINICAL DATA:  Kidney stones and colitis. Right upper quadrant pain, evaluate for abscess.  EXAM: CT ABDOMEN AND PELVIS WITH CONTRAST  TECHNIQUE: Multidetector CT imaging of the abdomen and pelvis was performed using the standard protocol following bolus administration of intravenous contrast.  CONTRAST:  173m OMNIPAQUE IOHEXOL 300 MG/ML SOLN, 554mOMNIPAQUE IOHEXOL 300 MG/ML SOLN  COMPARISON:  02/22/2014  FINDINGS: BODY WALL: Small periumbilical hernia.  LOWER CHEST: Small, layering pleural effusions similar to prior. There is associated subsegmental atelectasis.  ABDOMEN/PELVIS:  Liver: No focal abnormality.  Biliary: No evidence of biliary obstruction or stone.  Pancreas: Unremarkable.  Spleen: Unremarkable.  Adrenals: 2 cm nodule in the right adrenal gland which is stable over years and consistent with adenoma.  Kidneys and ureters: Numerous, nonobstructive bilateral renal calculi. Largest stone or stone conglomerate is in the right lower pole measuring 7 mm and associated with mild cortical scarring. No hydronephrosis or ureteral calculus  Bladder: Moderate distention of the bladder.  Reproductive: Hysterectomy. There is a corpus luteum cyst noted on the left.  Bowel: Colitis have resolved. There is no obstruction, perforation, or abscess. Mild distal colonic diverticulosis. Proximal appendix is distended by appendicoliths, but there is no acute inflammatory change or progression.  Retroperitoneum: No mass or adenopathy.  Peritoneum: No ascites or pneumoperitoneum.  Vascular: No acute abnormality.  OSSEOUS: No acute abnormalities. Prominent  spurring or calcified disc at L1-2 and T12-L1 with canal stenosis.  IMPRESSION: 1. No abscess or other explanation for right upper quadrant pain. 2. Resolved colitis. 3. Stable appearance of the appendix with appendicolith. No acute appendicitis. 4. Extensive nonobstructive nephrolithiasis.   Electronically Signed   By: Monte Fantasia M.D.   On: 02/27/2014 19:04   Ct Abdomen Pelvis W Contrast  02/22/2014   CLINICAL DATA:  Acute right lower quadrant pain for 3-4 days. Gross hematuria.  EXAM: CT ABDOMEN AND PELVIS WITH CONTRAST  TECHNIQUE: Multidetector CT imaging of the abdomen and pelvis was performed using the standard protocol following bolus administration of intravenous contrast.  CONTRAST:  46m OMNIPAQUE IOHEXOL 300 MG/ML SOLN, 1066mOMNIPAQUE IOHEXOL 300 MG/ML SOLN  COMPARISON:  CT scan of February 01, 2013.  FINDINGS: Minimal right pleural effusion is noted with adjacent subsegmental atelectasis. No significant osseous abnormality is noted.  No gallstones are noted. The liver, spleen and pancreas appear normal. Stable 2.5 cm right adrenal mass is noted consistent with adenoma. Bilateral nephrolithiasis is noted. No hydronephrosis or renal obstruction is noted. Mild wall thickening of descending colon is noted with minimal surrounding inflammatory changes suggesting colitis. There is no evidence of bowel obstruction. No abnormal fluid collection is noted.  The appendix does appear to be mildly thickened proximally, measuring at least 12 mm, which is increased compared to prior exam. Appendicolith is also noted. Minimal inflammatory changes may be seen in the adjacent fat. Urinary bladder appears normal. No significant adenopathy is noted. Ovaries appear normal. Status post hysterectomy.  IMPRESSION: Minimal right pleural effusion is noted.  Bilateral nonobstructive nephrolithiasis is noted. No hydronephrosis or renal obstruction is noted.  Stable right adrenal adenoma.  Wall thickening and surrounding  inflammatory changes are seen involving the descending colon suggesting colitis. This most likely is inflammatory or infectious in origin.  Mild thickening of the appendix is seen proximally, measuring 12 mm, which is increased compared to prior exam. Appendicolith is also now identified. There may be minimal inflammatory changes seen in the adjacent fat, and in the appropriate clinical setting, these findings would be suspicious for early appendicitis.   Electronically Signed   By: JaSabino Dick.D.   On: 02/22/2014 15:49    Microbiology: Recent Results (from the past 240 hour(s))  Clostridium Difficile by PCR     Status: None   Collection Time: 02/23/14  8:05 AM  Result Value Ref Range Status   C difficile by pcr NEGATIVE NEGATIVE Final    Comment: [email or web address]:  Recent Labs Lab 02/23/14 0530 02/27/14 1026 02/28/14 0615  NA 138 139 137  K 3.2* 4.2 3.5  CL 108 113* 109  CO2 23 17* 22  GLUCOSE 169* 231* 88  BUN 7 5* 6  CREATININE 0.63 0.80 0.81  CALCIUM 8.1* 8.7 8.8   Liver Function Tests:  Recent Labs Lab 02/23/14 0530  AST 14  ALT 13  ALKPHOS 69  BILITOT 0.4  PROT 6.4  ALBUMIN 3.0*   No results for input(s): LIPASE, AMYLASE in the last 168 hours. No results for input(s): AMMONIA in the last 168 hours. CBC:  Recent Labs Lab 02/22/14 1723 02/23/14 0530 02/27/14 1026  WBC 6.7 5.9 7.3  NEUTROABS 4.7  --   --   HGB 12.0 11.2* 12.1  HCT 37.0 35.6* 37.1  MCV 92.5 92.5 91.8  PLT 212 221 286   Cardiac Enzymes: No results for input(s): CKTOTAL, CKMB, CKMBINDEX, TROPONINI in the last 168 hours. BNP: BNP (last 3 results) No results for input(s): BNP in the last 8760 hours.  ProBNP (last 3 results)  Recent Labs  06/16/13 1524  PROBNP 7.7    CBG:  Recent Labs Lab 02/28/14 1141 02/28/14 1822 02/28/14 2144 03/01/14 0727 03/01/14 1120  GLUCAP 160* 108* 126* 114* 121*        Signed:  Vernell Leep, MD, FACP, FHM. [facility] Pager [phone]  If 7PM-7AM, please contact night-coverage [email or web address] Password [facility] 03/01/2014, 4:48 PM

## 2014-03-01 NOTE — Progress Notes (Signed)
[name] to be D/C'd Home per MD order.  Discussed prescriptions and follow up appointments with the patient. Prescriptions given to patient, medication list explained in detail. Pt verbalized understanding.    Medication List    STOP taking these medications        amoxicillin 500 MG capsule  Commonly known as:  AMOXIL     cetirizine 10 MG tablet  Commonly known as:  ZYRTEC      TAKE these medications        baclofen 10 MG tablet  Commonly known as:  LIORESAL  TAKE 1 TABLET BY MOUTH THREE TIMES A DAY AS NEEDED FOR MUSCLE SPASMS     ciprofloxacin 500 MG tablet  Commonly known as:  CIPRO  Take 1 tablet (500 mg total) by mouth 2 (two) times daily.     clopidogrel 75 MG tablet  Commonly known as:  PLAVIX  TAKE 1 TABLET BY MOUTH EVERY DAY     EASY TOUCH PEN NEEDLES 31G X 8 MM Misc  Generic drug:  Insulin Pen Needle  USE AS DIRECTED TO INJECT INSULIN DAILY     FLUoxetine 20 MG capsule  Commonly known as:  PROZAC  Take 3 capsules (60 mg total) by mouth daily at 12 noon.     fluticasone 50 MCG/ACT nasal spray  Commonly known as:  FLONASE  Place 2 sprays into both nostrils daily.     gabapentin 300 MG capsule  Commonly known as:  NEURONTIN  TAKE 2 CAPSULES BY MOUTH THREE TIMES A DAY     ibuprofen 600 MG tablet  Commonly known as:  ADVIL,MOTRIN  Take one tablet by mouth every 8 hours as needed for pain     Insulin Glargine 300 UNIT/ML Sopn  Commonly known as:  TOUJEO SOLOSTAR  Inject 45 Units into the skin 2 (two) times daily.     Insulin Lispro (Human) 200 UNIT/ML Sopn  Commonly known as:  HUMALOG KWIKPEN  Inject 5 Units into the skin 3 (three) times daily with meals.     lisinopril 40 MG tablet  Commonly known as:  PRINIVIL,ZESTRIL  Take 1 tablet (40 mg total) by mouth daily.     metroNIDAZOLE 500 MG tablet  Commonly known as:  FLAGYL  Take 1 tablet (500 mg total) by mouth 3 (three) times daily.     ONE TOUCH ULTRA TEST test strip  Generic drug:   glucose blood  USE TO CHECK BLOOD SUGAR FOUR TIMES A DAY     ONETOUCH DELICA LANCETS 56C Misc  TEST BLOOD SUGAR FOUR TIMES A DAY     pantoprazole 40 MG tablet  Commonly known as:  PROTONIX  TAKE 1 TABLET BY MOUTH DAILY     polyethylene glycol packet  Commonly known as:  MIRALAX  Take 17 g by mouth daily.     PROAIR HFA 108 (90 BASE) MCG/ACT inhaler  Generic drug:  albuterol  INHALE 2 PUFFS BY MOUTH EVERY 6 HOURS AS NEEDED FOR WHEEZING OR SHORTNESS OF BREATH     ranitidine 150 MG tablet  Commonly known as:  ZANTAC  TAKE 1 TABLET BY MOUTH TWICE DAILY     simvastatin 20 MG tablet  Commonly known as:  ZOCOR  TAKE 1 TABLET BY MOUTH EVERY MORNING FOR CHOLESTROL     solifenacin 10 MG tablet  Commonly known as:  VESICARE  Take 1 tablet (10 mg total) by mouth daily.     SYMBICORT 160-4.5 MCG/ACT inhaler  Generic drug:  budesonide-formoterol  INHALE 2 PUFFS BY MOUTH TWICE DAILY     Tdap 5-2.5-18.5 LF-MCG/0.5 injection  Commonly known as:  BOOSTRIX  Inject 0.5 mLs into the muscle once.     topiramate 200 MG tablet  Commonly known as:  TOPAMAX  TAKE 1 TABLET BY MOUTH TWICE DAILY     traZODone 150 MG tablet  Commonly known as:  DESYREL  Take 2 tablets (300 mg total) by mouth at bedtime.     verapamil 240 MG 24 hr capsule  Commonly known as:  VERELAN PM  TAKE 1 CAPSULE BY MOUTH EVERY NIGHT AT BEDTIME FOR BLOOD PRESSURE     Vitamin D (Ergocalciferol) 50000 UNITS Caps capsule  Commonly known as:  DRISDOL  Take 50,000 Units by mouth every Thursday.     zolpidem 10 MG tablet  Commonly known as:  AMBIEN  Take 1 tablet (10 mg total) by mouth at bedtime as needed for sleep.        Filed Vitals:   03/01/14 1346  BP: 132/79  Pulse: 69  Temp: 98.1 F (36.7 C)  Resp: 20    Skin clean, dry and intact without evidence of skin break down, no evidence of skin tears noted. IV catheter discontinued intact. Site without signs and symptoms of complications. Dressing and pressure  applied. Pt denies pain at this time. No complaints noted.  An After Visit Summary was printed and given to the patient. Patient escorted via [place], and D/C home via private auto.  Lolita Rieger 03/01/2014 5:44 PM

## 2014-03-08 ENCOUNTER — Ambulatory Visit: Payer: 59 | Admitting: Nurse Practitioner

## 2014-03-15 ENCOUNTER — Encounter: Payer: Self-pay | Admitting: Nurse Practitioner

## 2014-03-15 ENCOUNTER — Ambulatory Visit (INDEPENDENT_AMBULATORY_CARE_PROVIDER_SITE_OTHER): Payer: 59 | Admitting: Nurse Practitioner

## 2014-03-15 VITALS — BP 142/86 | HR 76 | Temp 99.0°F | Ht 66.0 in | Wt 225.0 lb

## 2014-03-15 DIAGNOSIS — K219 Gastro-esophageal reflux disease without esophagitis: Secondary | ICD-10-CM | POA: Diagnosis not present

## 2014-03-15 DIAGNOSIS — E66813 Obesity, class 3: Secondary | ICD-10-CM

## 2014-03-15 DIAGNOSIS — IMO0002 Reserved for concepts with insufficient information to code with codable children: Secondary | ICD-10-CM

## 2014-03-15 DIAGNOSIS — G894 Chronic pain syndrome: Secondary | ICD-10-CM | POA: Diagnosis not present

## 2014-03-15 DIAGNOSIS — E1129 Type 2 diabetes mellitus with other diabetic kidney complication: Secondary | ICD-10-CM

## 2014-03-15 DIAGNOSIS — E1165 Type 2 diabetes mellitus with hyperglycemia: Secondary | ICD-10-CM | POA: Diagnosis not present

## 2014-03-15 DIAGNOSIS — I10 Essential (primary) hypertension: Secondary | ICD-10-CM

## 2014-03-15 DIAGNOSIS — E559 Vitamin D deficiency, unspecified: Secondary | ICD-10-CM

## 2014-03-15 DIAGNOSIS — K529 Noninfective gastroenteritis and colitis, unspecified: Secondary | ICD-10-CM

## 2014-03-15 MED ORDER — INSULIN GLARGINE 300 UNIT/ML ~~LOC~~ SOPN: 90.0000 [IU] | PEN_INJECTOR | Freq: Every day | SUBCUTANEOUS | Status: DC

## 2014-03-15 MED ORDER — INSULIN LISPRO 200 UNIT/ML ~~LOC~~ SOPN: 5.0000 [IU] | PEN_INJECTOR | Freq: Three times a day (TID) | SUBCUTANEOUS | Status: DC

## 2014-03-15 MED ORDER — IBUPROFEN 600 MG PO TABS
ORAL_TABLET | ORAL | Status: DC
Start: 2014-03-15 — End: 2014-04-25

## 2014-03-15 MED ORDER — ZOLPIDEM TARTRATE 5 MG PO TABS: 5.0000 mg | ORAL_TABLET | Freq: Every evening | ORAL | Status: DC | PRN

## 2014-03-15 MED ORDER — TETANUS-DIPHTH-ACELL PERTUSSIS 5-2.5-18.5 LF-MCG/0.5 IM SUSP: 0.5000 mL | Freq: Once | INTRAMUSCULAR | Status: DC

## 2014-03-15 NOTE — Patient Instructions (Signed)
Cont on heart healthy diet!   Will follow up labs today  Follow up if abdominal discomfort resumes.   Keep all follow up appt

## 2014-03-15 NOTE — Progress Notes (Signed)
Patient ID: [name], female   DOB: 03/11/1965, 49 y.o.   MRN: [id number]    PCP: Lauree Chandler, NP  Allergies  Allergen Reactions  . Codeine Swelling and Other (See Comments)    Swelling and burning of mouth (inside)  . Lactose Intolerance (Gi) Nausea And Vomiting    Chief Complaint  Patient presents with  . Hospitalization Follow-up    Patient was hospitalized x 1 week, patient with colitis   . Orders    Request order for hospital bed      HPI: Patient is a 49 y.o. female seen in the office today to follow up hospitalization. Pt had abdominal pain and was admitted and treated for colitis.  pt missed original follow up appt due to transportation issues. Never took antibiotics that were given to her on discharge. Abdominal pain is much better. Not completely resolved.  Currently taking toujeo 90 units daily.  Currently not taking any meal coverage  Blood sugar morning 115-120, before lunch 190 conts modified low carb diet from the hospital.  [place] at physical therapy- now using walking with seat.  Needs new referral for physical therapy since she has been DC'd from the hospital. Helping with her pain in her lower back and neuropathy. Arms are getting stronger as well.   Needs to get reevaluated for OSA- CPAP in hospital which helped her sleep a lot.   Request hospital bed--  Sleeps on 4 pillow due to cough. Also with increased back pain. Needs to have her knees up. Also gets uncomfortable laying flat on her back.     Advanced Directive information Does patient have an advance directive?: No (has infomation, has not filled it out), Would patient like information on creating an advanced directive?: No - patient declined information Review of Systems:  Review of Systems  Constitutional: Negative for activity change, appetite change, fatigue and unexpected weight change.  Respiratory: Negative for cough and shortness of breath.        Hx of sleep apnea still doesn't  have machine, following with pulmonary  Cardiovascular: Negative for chest pain, palpitations and leg swelling.  Gastrointestinal: Negative for nausea, abdominal pain, diarrhea, constipation and abdominal distention.  Genitourinary: Negative for dysuria, urgency and frequency.  Musculoskeletal: Positive for myalgias and back pain.       Chronic pain  Skin: Negative for rash.  Neurological: Positive for weakness. Negative for dizziness, seizures (no seizures since 2014) and headaches.  Psychiatric/Behavioral: Negative for suicidal ideas and hallucinations. The patient is nervous/anxious.        Anxiety and depression controlled on medication    Past Medical History  Diagnosis Date  . Hypertension   . Asthma   . Seizures   . Heart murmur     born with   . Dysrhythmia     skipped beats  . Myocardial infarction     hi of x 2  last one 2003   . COPD (chronic obstructive pulmonary disease)     chronic bronchitis   . Pneumonia     hx of 2009  . GERD (gastroesophageal reflux disease)   . Headache(784.0)     migraines   . Neuropathy     due to diabetes   . Dizziness     secondary to diabetes and hypertension   . Arthritis     joint pain   . Hx of blood clots     hematomas removed from left side of brain from 88mo to 89yrold   .  Epilepsy idiopathic petit mal     last seizure 2012;controlled w/ topomax  . Diabetic neuropathy, painful     FEET AND HANDS  . Diabetes mellitus     since age 89; type 2 IDDM  . Sleep apnea     sleep study 2010 @ [facility];does not use Cpap ; mild  . MIGRAINE HEADACHE 09/14/2006    Qualifier: Diagnosis of  By: Hassell Done [name], Tori Milks    . Glaucoma     NOT ON ANY EYE DROPS   . Legally blind     left eye   . Stroke     last 2003  RESIDUAL LEFT LEG WEAKNESS--NO OTHER RESIDUAL PROBLEMS  . CVA 11/14/2007    Qualifier: Diagnosis of  By: Hassell Done [name], Tori Milks    . Pain 09/23/11    LOWER BACK AND RIGHT SIDE--PT HAS RIGHT URETERAL STONE  . Nephrolithiasis      frequent urination , urination at nite  PT SEEN IN ER 09/22/11 FOR BACK AND RT SIDED PAIN--HAS KNOWN STONE RT URETER AND UA IN ER SHOWED UTI  . Anxiety   . Depression   . Stress incontinence   . Hyperlipidemia   . Pyelonephritis    Past Surgical History  Procedure Laterality Date  . Kidney stone removal    . Brain hematoma evacuation      five procedures total, first procedure when 55 months old, last at 49 years of age.  . Shunt removal      shunt inserted at age 46 removed at age 46   . Eye surgery    . Retinal detachment surgery  1990  . Vaginal hysterectomy  1996  . Incisional hernia repair  05/05/2011    Procedure: LAPAROSCOPIC INCISIONAL HERNIA;  Surgeon: Rolm Bookbinder, MD;  Location: [facility];  Service: General;  Laterality: N/A;  . Mass excision  05/05/2011    Procedure: EXCISION MASS;  Surgeon: Rolm Bookbinder, MD;  Location: [facility];  Service: General;  Laterality: Right;  . Hernia repair  05/05/11    lap incisional hernia  . Pcnl    . Cystoscopy/retrograde/ureteroscopy  08/24/2011    Procedure: CYSTOSCOPY/RETROGRADE/URETEROSCOPY;  Surgeon: Molli Hazard, MD;  Location: [facility];  Service: Urology;  Laterality: Right;  Cysto, Right retrograde Pyelogram, right stent placement.   . Cystoscopy with ureteroscopy  09/24/2011    Procedure: CYSTOSCOPY WITH URETEROSCOPY;  Surgeon: Malka So, MD;  Location: [facility];  Service: Urology;  Laterality: Right;  Balloon dilation right ureter   . Cystoscopy w/ retrogrades  09/24/2011    Procedure: CYSTOSCOPY WITH RETROGRADE PYELOGRAM;  Surgeon: Malka So, MD;  Location: [facility];  Service: Urology;  Laterality: Right;   Social History:   reports that she quit smoking about 27 years ago. Her smoking use included Cigarettes. She has a 30 pack-year smoking history. She has never used smokeless tobacco. She reports that she does not drink alcohol or use illicit drugs.  Family History  Problem Relation Age of Onset  . Cancer Mother      uterine  . Hypertension Mother   . Cancer Maternal Grandmother     brain  . Hypertension Maternal Grandmother   . Anesthesia problems Neg Hx     Medications: Patient's Medications  New Prescriptions   No medications on file  Previous Medications   BACLOFEN (LIORESAL) 10 MG TABLET    TAKE 1 TABLET BY MOUTH THREE TIMES A DAY AS NEEDED FOR MUSCLE SPASMS   CLOPIDOGREL (PLAVIX) 75 MG  TABLET    TAKE 1 TABLET BY MOUTH EVERY DAY   EASY TOUCH PEN NEEDLES 31G X 8 MM MISC    USE AS DIRECTED TO INJECT INSULIN DAILY   FLUOXETINE (PROZAC) 20 MG CAPSULE    Take 3 capsules (60 mg total) by mouth daily at 12 noon.   FLUTICASONE (FLONASE) 50 MCG/ACT NASAL SPRAY    Place 2 sprays into both nostrils daily.   GABAPENTIN (NEURONTIN) 300 MG CAPSULE    TAKE 2 CAPSULES BY MOUTH THREE TIMES A DAY   LISINOPRIL (PRINIVIL,ZESTRIL) 40 MG TABLET    Take 1 tablet (40 mg total) by mouth daily.   ONE TOUCH ULTRA TEST TEST STRIP    USE TO CHECK BLOOD SUGAR FOUR TIMES A DAY   ONETOUCH DELICA LANCETS 16X MISC    TEST BLOOD SUGAR FOUR TIMES A DAY   PANTOPRAZOLE (PROTONIX) 40 MG TABLET    TAKE 1 TABLET BY MOUTH DAILY   POLYETHYLENE GLYCOL (MIRALAX) PACKET    Take 17 g by mouth daily.   PROAIR HFA 108 (90 BASE) MCG/ACT INHALER    INHALE 2 PUFFS BY MOUTH EVERY 6 HOURS AS NEEDED FOR WHEEZING OR SHORTNESS OF BREATH   RANITIDINE (ZANTAC) 150 MG TABLET    TAKE 1 TABLET BY MOUTH TWICE DAILY   SIMVASTATIN (ZOCOR) 20 MG TABLET    TAKE 1 TABLET BY MOUTH EVERY MORNING FOR CHOLESTROL   SOLIFENACIN (VESICARE) 10 MG TABLET    Take 1 tablet (10 mg total) by mouth daily.   SYMBICORT 160-4.5 MCG/ACT INHALER    INHALE 2 PUFFS BY MOUTH TWICE DAILY   TOPIRAMATE (TOPAMAX) 200 MG TABLET    TAKE 1 TABLET BY MOUTH TWICE DAILY   TRAZODONE (DESYREL) 150 MG TABLET    Take 2 tablets (300 mg total) by mouth at bedtime.   VERAPAMIL (VERELAN PM) 240 MG 24 HR CAPSULE    TAKE 1 CAPSULE BY MOUTH EVERY NIGHT AT BEDTIME FOR BLOOD PRESSURE   VITAMIN D,  ERGOCALCIFEROL, (DRISDOL) 50000 UNITS CAPS CAPSULE    Take 50,000 Units by mouth every Thursday.  Modified Medications   Modified Medication Previous Medication   IBUPROFEN (ADVIL,MOTRIN) 600 MG TABLET ibuprofen (ADVIL,MOTRIN) 600 MG tablet      Take one tablet by mouth every 8 hours as needed for pain    Take one tablet by mouth every 8 hours as needed for pain   INSULIN GLARGINE (TOUJEO SOLOSTAR) 300 UNIT/ML SOPN Insulin Glargine (TOUJEO SOLOSTAR) 300 UNIT/ML SOPN      Inject 90 Units into the skin daily at 6 (six) AM.    Inject 45 Units into the skin 2 (two) times daily.   INSULIN LISPRO, HUMAN, (HUMALOG KWIKPEN) 200 UNIT/ML SOPN Insulin Lispro, Human, (HUMALOG KWIKPEN) 200 UNIT/ML SOPN      Inject 5 Units into the skin 3 (three) times daily with meals.    Inject 5 Units into the skin 3 (three) times daily with meals.   TDAP (BOOSTRIX) 5-2.5-18.5 LF-MCG/0.5 INJECTION Tdap (BOOSTRIX) 5-2.5-18.5 LF-MCG/0.5 injection      Inject 0.5 mLs into the muscle once.    Inject 0.5 mLs into the muscle once.  Discontinued Medications   CIPROFLOXACIN (CIPRO) 500 MG TABLET    Take 1 tablet (500 mg total) by mouth 2 (two) times daily.   METRONIDAZOLE (FLAGYL) 500 MG TABLET    Take 1 tablet (500 mg total) by mouth 3 (three) times daily.   ZOLPIDEM (AMBIEN) 10 MG TABLET    Take  1 tablet (10 mg total) by mouth at bedtime as needed for sleep.     Physical Exam:  Filed Vitals:   03/15/14 1411  BP: 142/86  Pulse: 76  Temp: 99 F (37.2 C)  TempSrc: Oral  Height: 5' 6"  (1.676 m)  Weight: 225 lb (102.059 kg)  SpO2: 98%    Physical Exam  Constitutional: She is oriented to person, place, and time. She appears well-developed and well-nourished. No distress.  HENT:  Head: Normocephalic and atraumatic.  Neck: Normal range of motion. Neck supple.  Cardiovascular: Normal rate, regular rhythm and normal heart sounds.   Pulmonary/Chest: Effort normal and breath sounds normal.  Abdominal: Soft. Bowel sounds  are normal. She exhibits no distension. There is no tenderness.  Musculoskeletal: Normal range of motion. She exhibits no edema or tenderness.  Neurological: She is alert and oriented to person, place, and time.  Skin: Skin is warm and dry. She is not diaphoretic.  Psychiatric: She has a normal mood and affect.    Labs reviewed: Basic Metabolic Panel:  Recent Labs  02/23/14 0530 02/27/14 1026 02/28/14 0615  NA 138 139 137  K 3.2* 4.2 3.5  CL 108 113* 109  CO2 23 17* 22  GLUCOSE 169* 231* 88  BUN 7 5* 6  CREATININE 0.63 0.80 0.81  CALCIUM 8.1* 8.7 8.8   Liver Function Tests:  Recent Labs  06/16/13 1524  12/25/13 0911 02/22/14 1000 02/23/14 0530  AST 11  < > 8 12 14   ALT 11  < > 12 12 13   ALKPHOS 89  < > 95 76 69  BILITOT <0.2*  < > <0.2 0.4 0.4  PROT 7.0  < > 6.9 7.0 6.4  ALBUMIN 3.2*  --   --  3.2* 3.0*  < > = values in this interval not displayed. No results for input(s): LIPASE, AMYLASE in the last 8760 hours. No results for input(s): AMMONIA in the last 8760 hours. CBC:  Recent Labs  06/16/13 1524 09/07/13 0941 02/22/14 1723 02/23/14 0530 02/27/14 1026  WBC 7.4 6.4 6.7 5.9 7.3  NEUTROABS 4.9 4.4 4.7  --   --   HGB 12.3 12.6 12.0 11.2* 12.1  HCT 38.5 38.2 37.0 35.6* 37.1  MCV 92.5 88 92.5 92.5 91.8  PLT 249 294 212 221 286   Lipid Panel:  Recent Labs  06/05/13 0854 09/07/13 0941 12/25/13 0911  HDL 46 43 38*  LDLCALC 105* 98 97  TRIG 110 90 131  CHOLHDL 3.8 3.7 4.2   TSH: No results for input(s): TSH in the last 8760 hours. A1C: Lab Results  Component Value Date   HGBA1C 11.0* 02/22/2014     Assessment/Plan 1. Type 2 diabetes, uncontrolled, with renal manifestation -improved control with medication compliance and diet modifications. Will cont toujeo 90 units ONCE daily -also to start humalog 5 units with meals as directed from hospitalization.  - Insulin Glargine (TOUJEO SOLOSTAR) 300 UNIT/ML SOPN; Inject 90 Units into the skin daily  at 6 (six) AM. - Insulin Lispro, Human, (HUMALOG KWIKPEN) 200 UNIT/ML SOPN; Inject 5 Units into the skin 3 (three) times daily with meals.  2. Essential hypertension, benign -Patients hypertension is stable; continue current regimen. Will monitor and make changes as necessary.  3. Colitis -abdominal pain improved and diarrhea resolved.  -never took flagyl or cipro, will follow up labs - CBC with Differential/Platelet - Basic metabolic panel  4. Obesity, Class III, BMI 40-49.9 (morbid obesity) -need for weight loss, has increased physical activity  and made dietary changes. Conts with lifestyle modifications   5. Chronic pain disorder -following with pain clinic and PT with good results - ibuprofen (ADVIL,MOTRIN) 600 MG tablet; Take one tablet by mouth every 8 hours as needed for pain  Dispense: 90 tablet; Refill: 1  6. Gastroesophageal reflux disease without esophagitis -conts on protonix  7. Vitamin D deficiency -conts on Vit D 50000 units, follow up Vit D level.  - Vitamin D, 25-hydroxy  8. Insomnia -psychiatrist put pt on trazodone 300 mg and Ambien 10 mg qhs for sleep. Currently not even taking ambien. Will dc from medication list   40 mins Time TOTAL:  time greater than 50% of total time spent doing pt counseling on chronic conditions and coordination of care regarding hospitalization review and follow up on chronic conditions

## 2014-03-16 LAB — BASIC METABOLIC PANEL
BUN/Creatinine Ratio: 19 (ref 9–23)
BUN: 17 mg/dL (ref 6–24)
CO2: 21 mmol/L (ref 18–29)
Calcium: 10 mg/dL (ref 8.7–10.2)
Chloride: 103 mmol/L (ref 97–108)
Creatinine, Ser: 0.89 mg/dL (ref 0.57–1.00)
GFR calc Af Amer: 88 mL/min/{1.73_m2} (ref >59)
GFR calc non Af Amer: 76 mL/min/{1.73_m2} (ref >59)
Glucose: 167 mg/dL — ABNORMAL HIGH (ref 65–99)
Potassium: 4.4 mmol/L (ref 3.5–5.2)
Sodium: 139 mmol/L (ref 134–144)

## 2014-03-16 LAB — CBC WITH DIFFERENTIAL/PLATELET
Basophils Absolute: 0 10*3/uL (ref 0.0–0.2)
Basos: 0 %
Eos: 0 %
Eosinophils Absolute: 0 10*3/uL (ref 0.0–0.4)
HCT: 39.7 % (ref 34.0–46.6)
Hemoglobin: 13 g/dL (ref 11.1–15.9)
Immature Grans (Abs): 0 10*3/uL (ref 0.0–0.1)
Immature Granulocytes: 0 %
Lymphocytes Absolute: 1.8 10*3/uL (ref 0.7–3.1)
Lymphs: 39 %
MCH: 29.9 pg (ref 26.6–33.0)
MCHC: 32.7 g/dL (ref 31.5–35.7)
MCV: 91 fL (ref 79–97)
Monocytes Absolute: 0.5 10*3/uL (ref 0.1–0.9)
Monocytes: 10 %
Neutrophils Absolute: 2.4 10*3/uL (ref 1.4–7.0)
Neutrophils Relative %: 51 %
Platelets: 285 10*3/uL (ref 150–379)
RBC: 4.35 x10E6/uL (ref 3.77–5.28)
RDW: 13.8 % (ref 12.3–15.4)
WBC: 4.8 10*3/uL (ref 3.4–10.8)

## 2014-03-16 LAB — VITAMIN D 25 HYDROXY (VIT D DEFICIENCY, FRACTURES): Vit D, 25-Hydroxy: 20.5 ng/mL — ABNORMAL LOW (ref 30.0–100.0)

## 2014-03-19 ENCOUNTER — Ambulatory Visit: Payer: 59 | Admitting: Pharmacotherapy

## 2014-03-19 ENCOUNTER — Ambulatory Visit: Payer: Self-pay | Admitting: Pulmonary Disease

## 2014-03-26 ENCOUNTER — Ambulatory Visit: Payer: 59 | Admitting: Pharmacotherapy

## 2014-03-27 ENCOUNTER — Other Ambulatory Visit: Payer: Self-pay | Admitting: Internal Medicine

## 2014-03-28 ENCOUNTER — Other Ambulatory Visit: Payer: Self-pay | Admitting: Internal Medicine

## 2014-03-30 ENCOUNTER — Ambulatory Visit (HOSPITAL_COMMUNITY): Payer: Self-pay | Admitting: Psychiatry

## 2014-04-02 ENCOUNTER — Ambulatory Visit: Payer: 59 | Admitting: Pharmacotherapy

## 2014-04-02 DIAGNOSIS — Z0289 Encounter for other administrative examinations: Secondary | ICD-10-CM

## 2014-04-05 NOTE — Telephone Encounter (Signed)
Declined refill request of patient's Ambien as was discontinued by primary care NP, Sherrie Mustache on 03/15/14.

## 2014-04-19 ENCOUNTER — Ambulatory Visit: Payer: PRIVATE HEALTH INSURANCE | Admitting: Nurse Practitioner

## 2014-04-25 ENCOUNTER — Other Ambulatory Visit: Payer: Self-pay | Admitting: Internal Medicine

## 2014-04-25 ENCOUNTER — Other Ambulatory Visit: Payer: Self-pay | Admitting: Nurse Practitioner

## 2014-04-26 ENCOUNTER — Ambulatory Visit (INDEPENDENT_AMBULATORY_CARE_PROVIDER_SITE_OTHER): Payer: Medicare Other | Admitting: Nurse Practitioner

## 2014-04-26 ENCOUNTER — Encounter: Payer: Self-pay | Admitting: Nurse Practitioner

## 2014-04-26 VITALS — BP 126/84 | HR 84 | Temp 97.8°F | Resp 18 | Ht 66.0 in | Wt 228.0 lb

## 2014-04-26 DIAGNOSIS — N3946 Mixed incontinence: Secondary | ICD-10-CM

## 2014-04-26 DIAGNOSIS — E1129 Type 2 diabetes mellitus with other diabetic kidney complication: Secondary | ICD-10-CM

## 2014-04-26 DIAGNOSIS — E559 Vitamin D deficiency, unspecified: Secondary | ICD-10-CM

## 2014-04-26 DIAGNOSIS — G894 Chronic pain syndrome: Secondary | ICD-10-CM

## 2014-04-26 DIAGNOSIS — I10 Essential (primary) hypertension: Secondary | ICD-10-CM

## 2014-04-26 DIAGNOSIS — E1165 Type 2 diabetes mellitus with hyperglycemia: Secondary | ICD-10-CM

## 2014-04-26 DIAGNOSIS — IMO0002 Reserved for concepts with insufficient information to code with codable children: Secondary | ICD-10-CM

## 2014-04-26 MED ORDER — VITAMIN D (ERGOCALCIFEROL) 1.25 MG (50000 UNIT) PO CAPS: 50000.0000 [IU] | ORAL_CAPSULE | ORAL | Status: DC

## 2014-04-26 MED ORDER — MIRABEGRON ER 25 MG PO TB24: 25.0000 mg | ORAL_TABLET | Freq: Every day | ORAL | Status: DC

## 2014-04-26 NOTE — Progress Notes (Signed)
Patient ID: [name], female   DOB: 1965-04-28, 49 y.o.   MRN: [id number]    PCP: Lauree Chandler, NP  Allergies  Allergen Reactions  . Codeine Swelling and Other (See Comments)    Swelling and burning of mouth (inside)  . Lactose Intolerance (Gi) Nausea And Vomiting    Chief Complaint  Patient presents with  . Medical Management of Chronic Issues    3 month follow-up on diabetes   . Medication Management    Discuss Vesicare, insurance will only cover once daily, once daily dosing is not enough.      HPI: Patient is a 49 y.o. female seen in the office today for routine follow up. Pt not taking toujeo as prescribed-- sometimes taking 45 twice daily, sometimes adding additional doses Blood sugars reviewed ranging from 79-150- reports others are using her meter so unsure which blood sugars are really her.   vesicare is not working; still with OAB and having incontinent issues. Has tried multiple other medications that has not worked, has been to urologist in the past as well.  Conts to take Vit D-- but only taking 2000 units daily due to low vit D levels she was prescribed 50,000 units weekly but had not been taking.  Attempting to eat better and exercise more.  appt April 26 for pulmonary to evaluate sleep apnea   Advanced Directive information Does patient have an advance directive?: No, Would patient like information on creating an advanced directive?: Yes - Educational materials given Review of Systems:  Review of Systems  Constitutional: Negative for activity change, appetite change, fatigue and unexpected weight change.  Respiratory: Negative for cough and shortness of breath.        Hx of sleep apnea still doesn't have machine, following with pulmonary  Cardiovascular: Negative for chest pain, palpitations and leg swelling.  Gastrointestinal: Negative for nausea, abdominal pain, diarrhea, constipation and abdominal distention.  Genitourinary: Negative for dysuria,  urgency and frequency.       Incontinence of urine   Musculoskeletal: Positive for myalgias and back pain.       Chronic pain  Skin: Negative for rash.  Neurological: Positive for weakness. Negative for dizziness, seizures (no seizures since 2014) and headaches.  Psychiatric/Behavioral: Negative for suicidal ideas and hallucinations.    Past Medical History  Diagnosis Date  . Hypertension   . Asthma   . Seizures   . Heart murmur     born with   . Dysrhythmia     skipped beats  . Myocardial infarction     hi of x 2  last one 2003   . COPD (chronic obstructive pulmonary disease)     chronic bronchitis   . Pneumonia     hx of 2009  . GERD (gastroesophageal reflux disease)   . Headache(784.0)     migraines   . Neuropathy     due to diabetes   . Dizziness     secondary to diabetes and hypertension   . Arthritis     joint pain   . Hx of blood clots     hematomas removed from left side of brain from 9mo to 89yrold   . Epilepsy idiopathic petit mal     last seizure 2012;controlled w/ topomax  . Diabetic neuropathy, painful     FEET AND HANDS  . Diabetes mellitus     since age 89type 2 IDDM  . Sleep apnea     sleep study 2010 @  [facility];does not use Cpap ; mild  . MIGRAINE HEADACHE 09/14/2006    Qualifier: Diagnosis of  By: Hassell Done [name], Tori Milks    . Glaucoma     NOT ON ANY EYE DROPS   . Legally blind     left eye   . Stroke     last 2003  RESIDUAL LEFT LEG WEAKNESS--NO OTHER RESIDUAL PROBLEMS  . CVA 11/14/2007    Qualifier: Diagnosis of  By: Hassell Done [name], Tori Milks    . Pain 09/23/11    LOWER BACK AND RIGHT SIDE--PT HAS RIGHT URETERAL STONE  . Nephrolithiasis     frequent urination , urination at nite  PT SEEN IN ER 09/22/11 FOR BACK AND RT SIDED PAIN--HAS KNOWN STONE RT URETER AND UA IN ER SHOWED UTI  . Anxiety   . Depression   . Stress incontinence   . Hyperlipidemia   . Pyelonephritis    Past Surgical History  Procedure Laterality Date  . Kidney stone  removal    . Brain hematoma evacuation      five procedures total, first procedure when 66 months old, last at 49 years of age.  . Shunt removal      shunt inserted at age 5 removed at age 56   . Eye surgery    . Retinal detachment surgery  1990  . Vaginal hysterectomy  1996  . Incisional hernia repair  05/05/2011    Procedure: LAPAROSCOPIC INCISIONAL HERNIA;  Surgeon: Rolm Bookbinder, MD;  Location: [facility];  Service: General;  Laterality: N/A;  . Mass excision  05/05/2011    Procedure: EXCISION MASS;  Surgeon: Rolm Bookbinder, MD;  Location: [facility];  Service: General;  Laterality: Right;  . Hernia repair  05/05/11    lap incisional hernia  . Pcnl    . Cystoscopy/retrograde/ureteroscopy  08/24/2011    Procedure: CYSTOSCOPY/RETROGRADE/URETEROSCOPY;  Surgeon: Molli Hazard, MD;  Location: [facility];  Service: Urology;  Laterality: Right;  Cysto, Right retrograde Pyelogram, right stent placement.   . Cystoscopy with ureteroscopy  09/24/2011    Procedure: CYSTOSCOPY WITH URETEROSCOPY;  Surgeon: Malka So, MD;  Location: [facility];  Service: Urology;  Laterality: Right;  Balloon dilation right ureter   . Cystoscopy w/ retrogrades  09/24/2011    Procedure: CYSTOSCOPY WITH RETROGRADE PYELOGRAM;  Surgeon: Malka So, MD;  Location: [facility];  Service: Urology;  Laterality: Right;   Social History:   reports that she quit smoking about 27 years ago. Her smoking use included Cigarettes. She has a 30 pack-year smoking history. She has never used smokeless tobacco. She reports that she does not drink alcohol or use illicit drugs.  Family History  Problem Relation Age of Onset  . Cancer Mother     uterine  . Hypertension Mother   . Cancer Maternal Grandmother     brain  . Hypertension Maternal Grandmother   . Anesthesia problems Neg Hx     Medications: Patient's Medications  New Prescriptions   No medications on file  Previous Medications   BACLOFEN (LIORESAL) 10 MG TABLET    TAKE 1 TABLET  BY MOUTH THREE TIMES A DAY AS NEEDED FOR MUSCLE SPASMS   CLOPIDOGREL (PLAVIX) 75 MG TABLET    TAKE 1 TABLET BY MOUTH EVERY DAY   EASY TOUCH PEN NEEDLES 31G X 8 MM MISC    USE AS DIRECTED TO INJECT INSULIN DAILY   FLUOXETINE (PROZAC) 20 MG CAPSULE    Take 3 capsules (60 mg total) by mouth daily  at 12 noon.   FLUTICASONE (FLONASE) 50 MCG/ACT NASAL SPRAY    Place 2 sprays into both nostrils daily.   GABAPENTIN (NEURONTIN) 300 MG CAPSULE    TAKE 2 CAPSULES BY MOUTH THREE TIMES A DAY   IBUPROFEN (ADVIL,MOTRIN) 600 MG TABLET    Take one tablet by mouth every 8 hours as needed for pain   INSULIN GLARGINE (TOUJEO SOLOSTAR) 300 UNIT/ML SOPN    Inject 90 Units into the skin daily at 6 (six) AM.   INSULIN LISPRO, HUMAN, (HUMALOG KWIKPEN) 200 UNIT/ML SOPN    Inject 5 Units into the skin 3 (three) times daily with meals.   LISINOPRIL (PRINIVIL,ZESTRIL) 40 MG TABLET    Take 1 tablet (40 mg total) by mouth daily.   ONE TOUCH ULTRA TEST TEST STRIP    USE TO CHECK BLOOD SUGAR FOUR TIMES A DAY   ONETOUCH DELICA LANCETS 69C MISC    TEST BLOOD SUGAR FOUR TIMES A DAY   PANTOPRAZOLE (PROTONIX) 40 MG TABLET    TAKE 1 TABLET BY MOUTH DAILY   POLYETHYLENE GLYCOL (MIRALAX) PACKET    Take 17 g by mouth daily.   PROAIR HFA 108 (90 BASE) MCG/ACT INHALER    INHALE 2 PUFFS BY MOUTH EVERY 6 HOURS AS NEEDED FOR WHEEZING OR SHORTNESS OF BREATH   RANITIDINE (ZANTAC) 150 MG TABLET    TAKE 1 TABLET BY MOUTH TWICE DAILY   SIMVASTATIN (ZOCOR) 20 MG TABLET    TAKE 1 TABLET BY MOUTH EVERY MORNING FOR CHOLESTROL   SOLIFENACIN (VESICARE) 10 MG TABLET    Take 1 tablet (10 mg total) by mouth daily.   SYMBICORT 160-4.5 MCG/ACT INHALER    INHALE 2 PUFFS BY MOUTH TWICE DAILY   TDAP (BOOSTRIX) 5-2.5-18.5 LF-MCG/0.5 INJECTION    Inject 0.5 mLs into the muscle once.   TOPIRAMATE (TOPAMAX) 200 MG TABLET    TAKE 1 TABLET BY MOUTH TWICE DAILY   TRAZODONE (DESYREL) 150 MG TABLET    Take 2 tablets (300 mg total) by mouth at bedtime.   VERAPAMIL  (VERELAN PM) 240 MG 24 HR CAPSULE    TAKE 1 CAPSULE BY MOUTH EVERY NIGHT AT BEDTIME FOR BLOOD PRESSURE   VITAMIN D, ERGOCALCIFEROL, (DRISDOL) 50000 UNITS CAPS CAPSULE    Take 50,000 Units by mouth every Thursday.  Modified Medications   No medications on file  Discontinued Medications   No medications on file     Physical Exam:  Filed Vitals:   04/26/14 0805  BP: 126/84  Pulse: 84  Temp: 97.8 F (36.6 C)  TempSrc: Oral  Resp: 18  Height: 5' 6"  (1.676 m)  Weight: 228 lb (103.42 kg)  SpO2: 98%    Physical Exam  Constitutional: She is oriented to person, place, and time. She appears well-developed and well-nourished. No distress.  HENT:  Head: Normocephalic and atraumatic.  Neck: Normal range of motion. Neck supple.  Cardiovascular: Normal rate, regular rhythm and normal heart sounds.   Pulmonary/Chest: Effort normal and breath sounds normal.  Abdominal: Soft. Bowel sounds are normal. She exhibits no distension. There is no tenderness.  Musculoskeletal: Normal range of motion. She exhibits no edema or tenderness.  Neurological: She is alert and oriented to person, place, and time.  Skin: Skin is warm and dry. She is not diaphoretic.  Psychiatric: She has a normal mood and affect.    Labs reviewed: Basic Metabolic Panel:  Recent Labs  02/27/14 1026 02/28/14 0615 03/15/14 1524  NA 139 137 139  K  4.2 3.5 4.4  CL 113* 109 103  CO2 17* 22 21  GLUCOSE 231* 88 167*  BUN 5* 6 17  CREATININE 0.80 0.81 0.89  CALCIUM 8.7 8.8 10.0   Liver Function Tests:  Recent Labs  06/16/13 1524  12/25/13 0911 02/22/14 1000 02/23/14 0530  AST 11  < > 8 12 14   ALT 11  < > 12 12 13   ALKPHOS 89  < > 95 76 69  BILITOT <0.2*  < > <0.2 0.4 0.4  PROT 7.0  < > 6.9 7.0 6.4  ALBUMIN 3.2*  --   --  3.2* 3.0*  < > = values in this interval not displayed. No results for input(s): LIPASE, AMYLASE in the last 8760 hours. No results for input(s): AMMONIA in the last 8760  hours. CBC:  Recent Labs  09/07/13 0941 02/22/14 1723 02/23/14 0530 02/27/14 1026 03/15/14 1524  WBC 6.4 6.7 5.9 7.3 4.8  NEUTROABS 4.4 4.7  --   --  2.4  HGB 12.6 12.0 11.2* 12.1 13.0  HCT 38.2 37.0 35.6* 37.1 39.7  MCV 88 92.5 92.5 91.8 91  PLT 294 212 221 286 285   Lipid Panel:  Recent Labs  06/05/13 0854 09/07/13 0941 12/25/13 0911  CHOL 173 159 161  HDL 46 43 38*  LDLCALC 105* 98 97  TRIG 110 90 131  CHOLHDL 3.8 3.7 4.2   TSH: No results for input(s): TSH in the last 8760 hours. A1C: Lab Results  Component Value Date   HGBA1C 11.0* 02/22/2014     Assessment/Plan  1. Essential hypertension Controlled on current medications   2. Type 2 diabetes, uncontrolled, with renal manifestation -instructed to take toujeo 90 units daily, not to add any additional doses, to cont to take Humalog 5 units with meals - Hemoglobin A1c; Future and follow up in 1 month   3. Mixed urge and stress incontinence -to stop vesicare - mirabegron ER (MYRBETRIQ) 25 MG TB24 tablet; Take 1 tablet (25 mg total) by mouth daily.  Dispense: 30 tablet; Refill: 0  4. Chronic pain disorder -conts with pain management.   5. Vitamin D deficiency Educated on proper dosing, will send new Rx for VIt D 50,000 units  - Vitamin D, Ergocalciferol, (DRISDOL) 50000 UNITS CAPS capsule; Take 1 capsule (50,000 Units total) by mouth every Thursday.  Dispense: 30 capsule; Refill: 0

## 2014-04-26 NOTE — Patient Instructions (Signed)
Take blood sugars in the morning before eating-- write these blood sugars down with the time and bring to next visit  Stop vesicare Start Myrbetriq 25 mg by mouth day for bladder   Start taking Vit D 50000 units weekly  Follow up in 1 month with lab work prior to visit

## 2014-05-15 ENCOUNTER — Encounter: Payer: Self-pay | Admitting: Pulmonary Disease

## 2014-05-15 ENCOUNTER — Ambulatory Visit (INDEPENDENT_AMBULATORY_CARE_PROVIDER_SITE_OTHER): Payer: Medicare Other | Admitting: Pulmonary Disease

## 2014-05-15 VITALS — BP 100/70 | HR 85 | Ht 66.0 in | Wt 221.0 lb

## 2014-05-15 DIAGNOSIS — J984 Other disorders of lung: Secondary | ICD-10-CM

## 2014-05-15 DIAGNOSIS — G4733 Obstructive sleep apnea (adult) (pediatric): Secondary | ICD-10-CM

## 2014-05-15 NOTE — Patient Instructions (Signed)
You have mild obstructive sleep apnea We wills end Rx to [facility] for CPAP machine

## 2014-05-15 NOTE — Progress Notes (Signed)
Subjective:    Patient ID: [name], female    DOB: 07-13-1965, 49 y.o.   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 CT scan of abdomen and pelvis reported wall thickening and inflammatory changes  in the descending colon which suggested colitis  -shigella pcr pos  Was placed on CPAP due to desaturation during sleep with dramatic improvement. She was asked to seek evaluation after discharge. She was evaluated in 06/2013 for asthma and chronic bronchitis She quit smoking in 1989, about 20 pack years. She reports exposure to secondhand smoke-her husband smoked about 3 packs per day. She used to weigh as much as 360 pounds, she has lost 24 pounds over the last year to her current weight of 221 pounds. Sleep study in [place] had also shown OSA in the past. She is disabled after a stroke in 2006, she also has a seizure disorder and is on Plavix.  Epworth sleepiness score is 16 Bedtime is around midnight, sleep latency is variable up to 2 hours, she sleeps on her back with 3 pillows, reports 3 nocturnal awakenings without any post was sleep latency, is out of bed by 4 AM to get her granddaughter ready for school, then dozes off again at 7 AM. She also reports afternoon naps  Significant tests/ events PSG 02/2013 -wt - 245 pounds -total sleep time 349 minutes, AHI 6 per hour, lowest desat 82% PFTs 06/2013 -moderate restriction, ratio normal  Past Medical History  Diagnosis Date  . Hypertension   . Asthma   . Seizures   . Heart murmur     born with   . Dysrhythmia     skipped beats  . Myocardial infarction     hi of x 2  last one 2003   . COPD (chronic obstructive pulmonary disease)     chronic bronchitis   . Pneumonia     hx of 2009  . GERD (gastroesophageal reflux disease)   . Headache(784.0)     migraines   . Neuropathy     due to diabetes     . Dizziness     secondary to diabetes and hypertension   . Arthritis     joint pain   . Hx of blood clots     hematomas removed from left side of brain from 47mo to 54yrold   . Epilepsy idiopathic petit mal     49 years old;controlled w/ topomax  . Diabetic neuropathy, painful     FEET AND HANDS  . Diabetes mellitus     since age 89type 2 IDDM  . Sleep apnea     sleep study 2010 @ [facility];does not use Cpap ; mild  . MIGRAINE HEADACHE 09/14/2006    Qualifier: Diagnosis of  By: MaHassell DoneNP, NyTori Milks  . Glaucoma     NOT ON ANY EYE DROPS   . Legally blind     left eye   . Stroke     last 2003  RESIDUAL LEFT LEG WEAKNESS--NO OTHER RESIDUAL PROBLEMS  . CVA 11/14/2007    Qualifier: Diagnosis of  By: MaHassell DoneNP, NyTori Milks  . Pain 09/23/11    LOWER BACK AND RIGHT SIDE--PT HAS RIGHT URETERAL STONE  . Nephrolithiasis     frequent urination , urination at nite  PT SEEN IN ER 09/22/11 FOR BACK AND RT SIDED PAIN--HAS KNOWN STONE  RT URETER AND UA IN ER SHOWED UTI  . Anxiety   . Depression   . Stress incontinence   . Hyperlipidemia   . Pyelonephritis     Past Surgical History  Procedure Laterality Date  . Kidney stone removal    . Brain hematoma evacuation      five procedures total, first procedure when 49 years old, last at 49 years of age.  . Shunt removal      shunt inserted at age 49 removed at age 67   . Eye surgery    . Retinal detachment surgery  1990  . Vaginal hysterectomy  1996  . Incisional hernia repair  05/05/2011    Procedure: LAPAROSCOPIC INCISIONAL HERNIA;  Surgeon: Rolm Bookbinder, MD;  Location: [place];  Service: General;  Laterality: N/A;  . Mass excision  05/05/2011    Procedure: EXCISION MASS;  Surgeon: Rolm Bookbinder, MD;  Location: [place];  Service: General;  Laterality: Right;  . Hernia repair  05/05/11    lap incisional hernia  . Pcnl    . Cystoscopy/retrograde/ureteroscopy  08/24/2011    Procedure: CYSTOSCOPY/RETROGRADE/URETEROSCOPY;  Surgeon:  Molli Hazard, MD;  Location: [facility];  Service: Urology;  Laterality: Right;  Cysto, Right retrograde Pyelogram, right stent placement.   . Cystoscopy with ureteroscopy  09/24/2011    Procedure: CYSTOSCOPY WITH URETEROSCOPY;  Surgeon: Malka So, MD;  Location: [facility];  Service: Urology;  Laterality: Right;  Balloon dilation right ureter   . Cystoscopy w/ retrogrades  09/24/2011    Procedure: CYSTOSCOPY WITH RETROGRADE PYELOGRAM;  Surgeon: Malka So, MD;  Location: [facility];  Service: Urology;  Laterality: Right;   Allergies  Allergen Reactions  . Codeine Swelling and Other (See Comments)    Swelling and burning of mouth (inside)  . Lactose Intolerance (Gi) Nausea And Vomiting    History   Social History  . Marital Status: Married    Spouse Name: N/A  . Number of Children: N/A  . Years of Education: N/A   Occupational History  . Not on file.   Social History Main Topics  . Smoking status: Former Smoker -- 1.00 packs/day for 30 years    Types: Cigarettes    Quit date: 01/20/1987  . Smokeless tobacco: Never Used     Comment: quit more than 20years  . Alcohol Use: No  . Drug Use: No     Comment: hx of marijuana use no longer uses   . Sexual Activity: Not Currently    Birth Control/ Protection: Surgical   Other Topics Concern  . Not on file   Social History Narrative    Family History  Problem Relation Age of Onset  . Cancer Mother     uterine  . Hypertension Mother   . Cancer Maternal Grandmother     brain  . Hypertension Maternal Grandmother   . Anesthesia problems Neg Hx     Review of Systems  Constitutional: Negative for fever, chills and unexpected weight change.  HENT: Negative for congestion, dental problem, ear pain, nosebleeds, postnasal drip, rhinorrhea, sinus pressure, sneezing, sore throat, trouble swallowing and voice change.   Eyes: Negative for visual disturbance.  Respiratory: Negative for cough, choking and shortness of breath.     Cardiovascular: Negative for chest pain and leg swelling.  Gastrointestinal: Negative for vomiting, abdominal pain and diarrhea.  Genitourinary: Negative for difficulty urinating.  Musculoskeletal: Negative for arthralgias.  Skin: Negative for rash.  Neurological: Negative for tremors, syncope and headaches.  Hematological: Does not bruise/bleed easily.       Objective:   Physical Exam  Gen. Pleasant, obese, in no distress, normal affect ENT - no lesions, no post nasal drip, class 2-3 airway Neck: No JVD, no thyromegaly, no carotid bruits Lungs: no use of accessory muscles, no dullness to percussion, decreased without rales or rhonchi  Cardiovascular: Rhythm regular, heart sounds  normal, no murmurs or gallops, no peripheral edema Abdomen: soft and non-tender, no hepatosplenomegaly, BS normal. Musculoskeletal: No deformities, no cyanosis or clubbing Neuro:  alert, non focal, no tremors       Assessment & Plan:

## 2014-05-15 NOTE — Assessment & Plan Note (Signed)
No significant obstructive disease noted on PFTs. She has been maintained on Symbicort for some time due to a history of asthma we'll consider tapering this to off in the future

## 2014-05-15 NOTE — Assessment & Plan Note (Signed)
Start on autoCPAP 5-12 cm, FF mask, download in 4 wks Weight loss encouraged, compliance with goal of at least 4-6 hrs every night is the expectation. Advised against medications with sedative side effects Cautioned against driving when sleepy - understanding that sleepiness will vary on a day to day basis

## 2014-05-16 ENCOUNTER — Telehealth: Payer: Self-pay | Admitting: Pulmonary Disease

## 2014-05-16 NOTE — Telephone Encounter (Signed)
Left message for [facility] stenson @[facility]  Joellen Jersey

## 2014-05-17 ENCOUNTER — Other Ambulatory Visit: Payer: Self-pay | Admitting: *Deleted

## 2014-05-17 DIAGNOSIS — N3946 Mixed incontinence: Secondary | ICD-10-CM

## 2014-05-17 MED ORDER — MIRABEGRON ER 25 MG PO TB24: ORAL_TABLET | ORAL | Status: DC

## 2014-05-17 NOTE — Telephone Encounter (Signed)
[facility] faxed refill request stating they are trying to transfer this medication from another pharmacy, but it had no refills and needed a Rx faxed to them.

## 2014-05-17 NOTE — Telephone Encounter (Signed)
[name] sent me a staff message, she got it thanks! [name]

## 2014-05-23 ENCOUNTER — Other Ambulatory Visit: Payer: Self-pay | Admitting: Internal Medicine

## 2014-05-23 ENCOUNTER — Other Ambulatory Visit: Payer: Self-pay | Admitting: Nurse Practitioner

## 2014-05-24 ENCOUNTER — Other Ambulatory Visit: Payer: Medicare Other

## 2014-05-24 DIAGNOSIS — IMO0002 Reserved for concepts with insufficient information to code with codable children: Secondary | ICD-10-CM

## 2014-05-24 DIAGNOSIS — E1129 Type 2 diabetes mellitus with other diabetic kidney complication: Secondary | ICD-10-CM

## 2014-05-24 DIAGNOSIS — E1165 Type 2 diabetes mellitus with hyperglycemia: Principal | ICD-10-CM

## 2014-05-25 LAB — HEMOGLOBIN A1C
Est. average glucose Bld gHb Est-mCnc: 206 mg/dL
Hgb A1c MFr Bld: 8.8 % — ABNORMAL HIGH (ref 4.8–5.6)

## 2014-05-26 ENCOUNTER — Other Ambulatory Visit: Payer: Self-pay | Admitting: Nurse Practitioner

## 2014-05-29 ENCOUNTER — Ambulatory Visit (INDEPENDENT_AMBULATORY_CARE_PROVIDER_SITE_OTHER): Payer: Medicare Other | Admitting: Nurse Practitioner

## 2014-05-29 ENCOUNTER — Encounter: Payer: Self-pay | Admitting: Nurse Practitioner

## 2014-05-29 VITALS — BP 134/88 | HR 76 | Temp 98.0°F | Resp 18 | Ht 66.0 in | Wt 220.0 lb

## 2014-05-29 DIAGNOSIS — E1129 Type 2 diabetes mellitus with other diabetic kidney complication: Secondary | ICD-10-CM

## 2014-05-29 DIAGNOSIS — N3946 Mixed incontinence: Secondary | ICD-10-CM

## 2014-05-29 DIAGNOSIS — I10 Essential (primary) hypertension: Secondary | ICD-10-CM | POA: Diagnosis not present

## 2014-05-29 DIAGNOSIS — E1165 Type 2 diabetes mellitus with hyperglycemia: Secondary | ICD-10-CM | POA: Diagnosis not present

## 2014-05-29 DIAGNOSIS — I252 Old myocardial infarction: Secondary | ICD-10-CM | POA: Diagnosis not present

## 2014-05-29 DIAGNOSIS — G894 Chronic pain syndrome: Secondary | ICD-10-CM | POA: Diagnosis not present

## 2014-05-29 DIAGNOSIS — G4700 Insomnia, unspecified: Secondary | ICD-10-CM | POA: Diagnosis not present

## 2014-05-29 DIAGNOSIS — IMO0002 Reserved for concepts with insufficient information to code with codable children: Secondary | ICD-10-CM

## 2014-05-29 DIAGNOSIS — E559 Vitamin D deficiency, unspecified: Secondary | ICD-10-CM

## 2014-05-29 MED ORDER — INSULIN GLARGINE 300 UNIT/ML ~~LOC~~ SOPN: 93.0000 [IU] | PEN_INJECTOR | Freq: Every day | SUBCUTANEOUS | Status: DC

## 2014-05-29 NOTE — Progress Notes (Signed)
Patient ID: [name], female   DOB: [birth year], 49 y.o.   MRN: [id number]    PCP: Lauree Chandler, NP  Allergies  Allergen Reactions  . Codeine Swelling and Other (See Comments)    Swelling and burning of mouth (inside)  . Lactose Intolerance (Gi) Nausea And Vomiting    Chief Complaint  Patient presents with  . Medical Management of Chronic Issues    1 month follow-up on DM and bladder medication. Patient states medication is working well      HPI: Patient is a 49 y.o. female seen in the office today for routine follow up. Has been tested for CPAP and got her cpap and wore it last night. Feels well rested today.   Has been taking myrebetriq and working much better due to incontinence.   Just got vit D 50,000 units weekly- has taken one dose.   Taking toujeo 90 units once daily- does not have blood sugar log, took blood sugar this morning it was 150, 140, 160. No hypoglycemic episodes.  Still Attempting to eat better and exercise more. Walking several times a day. ~20 mins 3 times daily. Eating better.   eReview of Systems:  Review of Systems  Constitutional: Negative for activity change, appetite change, fatigue and unexpected weight change.  Respiratory: Negative for cough and shortness of breath.        Hx of sleep apnea has CPAP  Cardiovascular: Negative for chest pain, palpitations and leg swelling.  Gastrointestinal: Positive for constipation (at times). Negative for nausea, abdominal pain, diarrhea and abdominal distention.  Genitourinary: Negative for dysuria, urgency and frequency.       Incontinence of urine -- improved   Musculoskeletal: Positive for myalgias and back pain.       Chronic pain  Skin: Negative for rash.  Neurological: Negative for dizziness, seizures (no seizures since 2014), weakness and headaches.  Psychiatric/Behavioral: Negative for suicidal ideas and hallucinations.    Past Medical History  Diagnosis Date  . Hypertension   .  Asthma   . Seizures   . Heart murmur     born with   . Dysrhythmia     skipped beats  . Myocardial infarction     hi of x 2  last one 2003   . COPD (chronic obstructive pulmonary disease)     chronic bronchitis   . Pneumonia     hx of 2009  . GERD (gastroesophageal reflux disease)   . Headache(784.0)     migraines   . Neuropathy     due to diabetes   . Dizziness     secondary to diabetes and hypertension   . Arthritis     joint pain   . Hx of blood clots     hematomas removed from left side of brain from 70mo to 89yrold   . Epilepsy idiopathic petit mal     last seizure 2012;controlled w/ topomax  . Diabetic neuropathy, painful     FEET AND HANDS  . Diabetes mellitus     since age [age]type 2 IDDM  . Sleep apnea     sleep study 2010 @ [facility];does not use Cpap ; mild  . MIGRAINE HEADACHE 09/14/2006    Qualifier: Diagnosis of  By: MaHassell DoneNP, NyTori Milks  . Glaucoma     NOT ON ANY EYE DROPS   . Legally blind     left eye   . Stroke     last 2003  RESIDUAL  LEFT LEG WEAKNESS--NO OTHER RESIDUAL PROBLEMS  . CVA 11/14/2007    Qualifier: Diagnosis of  By: Hassell Done [name], Tori Milks    . Pain 09/23/11    LOWER BACK AND RIGHT SIDE--PT HAS RIGHT URETERAL STONE  . Nephrolithiasis     frequent urination , urination at nite  PT SEEN IN ER 09/22/11 FOR BACK AND RT SIDED PAIN--HAS KNOWN STONE RT URETER AND UA IN ER SHOWED UTI  . Anxiety   . Depression   . Stress incontinence   . Hyperlipidemia   . Pyelonephritis    Past Surgical History  Procedure Laterality Date  . Kidney stone removal    . Brain hematoma evacuation      five procedures total, first procedure when 82 months old, last at 49 years of age.  . Shunt removal      shunt inserted at age 63 removed at age 49   . Eye surgery    . Retinal detachment surgery  1990  . Vaginal hysterectomy  1996  . Incisional hernia repair  05/05/2011    Procedure: LAPAROSCOPIC INCISIONAL HERNIA;  Surgeon: Rolm Bookbinder, MD;  Location:  [place];  Service: General;  Laterality: N/A;  . Mass excision  05/05/2011    Procedure: EXCISION MASS;  Surgeon: Rolm Bookbinder, MD;  Location: [place];  Service: General;  Laterality: Right;  . Hernia repair  05/05/11    lap incisional hernia  . Pcnl    . Cystoscopy/retrograde/ureteroscopy  08/24/2011    Procedure: CYSTOSCOPY/RETROGRADE/URETEROSCOPY;  Surgeon: Molli Hazard, MD;  [facility];  Service: Urology;  Laterality: Right;  Cysto, Right retrograde Pyelogram, right stent placement.   . Cystoscopy with ureteroscopy  09/24/2011    Procedure: CYSTOSCOPY WITH URETEROSCOPY;  Surgeon: Malka So, MD;  Location: [facility];  Service: Urology;  Laterality: Right;  Balloon dilation right ureter   . Cystoscopy w/ retrogrades  09/24/2011    Procedure: CYSTOSCOPY WITH RETROGRADE PYELOGRAM;  Surgeon: Malka So, MD;  Location: [facility];  Service: Urology;  Laterality: Right;   Social History:   reports that she quit smoking about 27 years ago. Her smoking use included Cigarettes. She has a 30 pack-year smoking history. She has never used smokeless tobacco. She reports that she does not drink alcohol or use illicit drugs.  Family History  Problem Relation Age of Onset  . Cancer Mother     uterine  . Hypertension Mother   . Cancer Maternal Grandmother     brain  . Hypertension Maternal Grandmother   . Anesthesia problems Neg Hx     Medications: Patient's Medications  New Prescriptions   No medications on file  Previous Medications   BACLOFEN (LIORESAL) 10 MG TABLET    TAKE 1 TABLET BY MOUTH THREE TIMES A DAY AS NEEDED FOR MUSCLE SPASMS   CLOPIDOGREL (PLAVIX) 75 MG TABLET    TAKE 1 TABLET BY MOUTH EVERY DAY   EASY TOUCH PEN NEEDLES 31G X 8 MM MISC    USE AS DIRECTED TO INJECT INSULIN DAILY   FLUOXETINE (PROZAC) 20 MG CAPSULE    Take 3 capsules (60 mg total) by mouth daily at 12 noon.   FLUTICASONE (FLONASE) 50 MCG/ACT NASAL SPRAY    Place 2 sprays into both nostrils daily.    GABAPENTIN (NEURONTIN) 300 MG CAPSULE    TAKE 2 CAPSULES BY MOUTH THREE TIMES A DAY   IBUPROFEN (ADVIL,MOTRIN) 600 MG TABLET    TAKE 1 TABLET BY MOUTH EVERY 8 HOURS AS  NEEDED FOR PAIN   INSULIN LISPRO, HUMAN, (HUMALOG KWIKPEN) 200 UNIT/ML SOPN    Inject 5 Units into the skin 3 (three) times daily with meals.   LISINOPRIL (PRINIVIL,ZESTRIL) 40 MG TABLET    Take 1 tablet (40 mg total) by mouth daily.   MYRBETRIQ 25 MG TB24 TABLET    TAKE 1 TABLET BY MOUTH EVERY DAY   ONE TOUCH ULTRA TEST TEST STRIP    USE TO CHECK BLOOD SUGAR FOUR TIMES A DAY   ONETOUCH DELICA LANCETS 48N MISC    TEST BLOOD SUGAR FOUR TIMES A DAY   PANTOPRAZOLE (PROTONIX) 40 MG TABLET    TAKE 1 TABLET BY MOUTH DAILY   POLYETHYLENE GLYCOL (MIRALAX) PACKET    Take 17 g by mouth daily.   PROAIR HFA 108 (90 BASE) MCG/ACT INHALER    INHALE 2 PUFFS BY MOUTH EVERY 6 HOURS AS NEEDED FOR WHEEZING OR SHORTNESS OF BREATH   RANITIDINE (ZANTAC) 150 MG TABLET    TAKE 1 TABLET BY MOUTH TWICE DAILY   SIMVASTATIN (ZOCOR) 20 MG TABLET    TAKE 1 TABLET BY MOUTH EVERY MORNING FOR CHOLESTROL   SYMBICORT 160-4.5 MCG/ACT INHALER    INHALE 2 PUFFS BY MOUTH TWICE DAILY   TOPIRAMATE (TOPAMAX) 200 MG TABLET    TAKE 1 TABLET BY MOUTH TWICE DAILY   TOUJEO SOLOSTAR 300 UNIT/ML SOPN    INJECT 90UNITS SUBCUTANEOUSLY EVERY DAY   VERAPAMIL (VERELAN PM) 240 MG 24 HR CAPSULE    TAKE 1 CAPSULE BY MOUTH EVERY NIGHT AT BEDTIME FOR BLOOD PRESSURE   VITAMIN D, ERGOCALCIFEROL, (DRISDOL) 50000 UNITS CAPS CAPSULE    Take 1 capsule (50,000 Units total) by mouth every Thursday.  Modified Medications   Modified Medication Previous Medication   TRAZODONE (DESYREL) 150 MG TABLET traZODone (DESYREL) 150 MG tablet      1 by mouth two times daily    Take 2 tablets (300 mg total) by mouth at bedtime.  Discontinued Medications   No medications on file     Physical Exam:  Filed Vitals:   05/29/14 0953  BP: 134/88  Pulse: 76  Temp: 98 F (36.7 C)  TempSrc: Oral  Resp: 18    Height: 5' 6"  (1.676 m)  Weight: 220 lb (99.791 kg)  SpO2: 98%    Physical Exam  Constitutional: She is oriented to person, place, and time. She appears well-developed and well-nourished. No distress.  HENT:  Head: Normocephalic and atraumatic.  Neck: Normal range of motion. Neck supple.  Cardiovascular: Normal rate, regular rhythm and normal heart sounds.   Pulmonary/Chest: Effort normal and breath sounds normal.  Abdominal: Soft. Bowel sounds are normal. She exhibits no distension. There is no tenderness.  Musculoskeletal: Normal range of motion. She exhibits no edema or tenderness.  Neurological: She is alert and oriented to person, place, and time.  Skin: Skin is warm and dry. She is not diaphoretic.  Psychiatric: She has a normal mood and affect.    Labs reviewed: Basic Metabolic Panel:  Recent Labs  02/27/14 1026 02/28/14 0615 03/15/14 1524  NA 139 137 139  K 4.2 3.5 4.4  CL 113* 109 103  CO2 17* 22 21  GLUCOSE 231* 88 167*  BUN 5* 6 17  CREATININE 0.80 0.81 0.89  CALCIUM 8.7 8.8 10.0   Liver Function Tests:  Recent Labs  06/16/13 1524  12/25/13 0911 02/22/14 1000 02/23/14 0530  AST 11  < > 8 12 14   ALT 11  < >  12 12 13   ALKPHOS 89  < > 95 76 69  BILITOT <0.2*  < > <0.2 0.4 0.4  PROT 7.0  < > 6.9 7.0 6.4  ALBUMIN 3.2*  --   --  3.2* 3.0*  < > = values in this interval not displayed. No results for input(s): LIPASE, AMYLASE in the last 8760 hours. No results for input(s): AMMONIA in the last 8760 hours. CBC:  Recent Labs  09/07/13 0941 02/22/14 1723 02/23/14 0530 02/27/14 1026 03/15/14 1524  WBC 6.4 6.7 5.9 7.3 4.8  NEUTROABS 4.4 4.7  --   --  2.4  HGB 12.6 12.0 11.2* 12.1 13.0  HCT 38.2 37.0 35.6* 37.1 39.7  MCV 88 92.5 92.5 91.8 91  PLT 294 212 221 286 285   Lipid Panel:  Recent Labs  06/05/13 0854 09/07/13 0941 12/25/13 0911  CHOL 173 159 161  HDL 46 43 38*  LDLCALC 105* 98 97  TRIG 110 90 131  CHOLHDL 3.8 3.7 4.2   TSH: No  results for input(s): TSH in the last 8760 hours. A1C: Lab Results  Component Value Date   HGBA1C 8.8* 05/24/2014     Assessment/Plan  1. Insomnia -sleeping better, taking trazodone twice daily due to depression as well as insomnia.  - traZODone (DESYREL) 150 MG tablet; 1 by mouth two times daily  2. Type 2 diabetes, uncontrolled, with renal manifestation -improved controlled. A1c is better, yet not at goal, does not have blood sugar log today.  Reports increase exercise and eating better.  -will increase toujeo to 93 units daily and for pt to cont lifestyle modifications.  - Hemoglobin A1c; Future - Comprehensive metabolic panel; Future - Insulin Glargine (TOUJEO SOLOSTAR) 300 UNIT/ML SOPN; Inject 93 Units into the skin daily.  Dispense: 9 pen; Refill: 3  3. Vitamin D deficiency -cont vit D 50,000 units weekly  4. Chronic pain disorder -well controlled on current regimen, reports increase in activity has helped her pain.   5. Essential hypertension, benign - stable at this time, will cont current medications  6. Mixed urge and stress incontinence -improved symptoms on myrbetriq, will cont myrbetriq 25 mg daily   7. MYOCARDIAL INFARCTION, HX OF -conts on plavix due to MI, will follow up blood work prior to next visit  - CBC with Differential/Platelet; Future  Follow up in 3 months

## 2014-05-29 NOTE — Patient Instructions (Signed)
*  note changes to toujeo* increasing to 93 units daily  Cont all other medications   Follow up in 3 months with blood work prior to appt, bring blood sugars with you to next appt

## 2014-06-17 ENCOUNTER — Emergency Department (HOSPITAL_COMMUNITY)
Admission: EM | Admit: 2014-06-17 | Discharge: 2014-06-18 | Disposition: A | Discharge status: Home / Self Care | Payer: Medicare Other | Attending: Emergency Medicine | Admitting: Emergency Medicine

## 2014-06-17 ENCOUNTER — Encounter (HOSPITAL_COMMUNITY): Payer: Self-pay | Admitting: Emergency Medicine

## 2014-06-17 DIAGNOSIS — E1140 Type 2 diabetes mellitus with diabetic neuropathy, unspecified: Secondary | ICD-10-CM | POA: Insufficient documentation

## 2014-06-17 DIAGNOSIS — F419 Anxiety disorder, unspecified: Secondary | ICD-10-CM | POA: Insufficient documentation

## 2014-06-17 DIAGNOSIS — R011 Cardiac murmur, unspecified: Secondary | ICD-10-CM | POA: Diagnosis not present

## 2014-06-17 DIAGNOSIS — E785 Hyperlipidemia, unspecified: Secondary | ICD-10-CM | POA: Diagnosis not present

## 2014-06-17 DIAGNOSIS — N200 Calculus of kidney: Secondary | ICD-10-CM | POA: Insufficient documentation

## 2014-06-17 DIAGNOSIS — Z7951 Long term (current) use of inhaled steroids: Secondary | ICD-10-CM | POA: Diagnosis not present

## 2014-06-17 DIAGNOSIS — E876 Hypokalemia: Secondary | ICD-10-CM | POA: Diagnosis not present

## 2014-06-17 DIAGNOSIS — G40909 Epilepsy, unspecified, not intractable, without status epilepticus: Secondary | ICD-10-CM | POA: Diagnosis not present

## 2014-06-17 DIAGNOSIS — Z79899 Other long term (current) drug therapy: Secondary | ICD-10-CM | POA: Diagnosis not present

## 2014-06-17 DIAGNOSIS — I10 Essential (primary) hypertension: Secondary | ICD-10-CM | POA: Insufficient documentation

## 2014-06-17 DIAGNOSIS — N390 Urinary tract infection, site not specified: Secondary | ICD-10-CM | POA: Insufficient documentation

## 2014-06-17 DIAGNOSIS — F329 Major depressive disorder, single episode, unspecified: Secondary | ICD-10-CM | POA: Insufficient documentation

## 2014-06-17 DIAGNOSIS — I252 Old myocardial infarction: Secondary | ICD-10-CM | POA: Diagnosis not present

## 2014-06-17 DIAGNOSIS — Z86718 Personal history of other venous thrombosis and embolism: Secondary | ICD-10-CM | POA: Diagnosis not present

## 2014-06-17 DIAGNOSIS — Z794 Long term (current) use of insulin: Secondary | ICD-10-CM | POA: Insufficient documentation

## 2014-06-17 DIAGNOSIS — Z8701 Personal history of pneumonia (recurrent): Secondary | ICD-10-CM | POA: Diagnosis not present

## 2014-06-17 DIAGNOSIS — Z7902 Long term (current) use of antithrombotics/antiplatelets: Secondary | ICD-10-CM | POA: Diagnosis not present

## 2014-06-17 DIAGNOSIS — G43909 Migraine, unspecified, not intractable, without status migrainosus: Secondary | ICD-10-CM | POA: Insufficient documentation

## 2014-06-17 DIAGNOSIS — H548 Legal blindness, as defined in USA: Secondary | ICD-10-CM | POA: Diagnosis not present

## 2014-06-17 DIAGNOSIS — Z87891 Personal history of nicotine dependence: Secondary | ICD-10-CM | POA: Insufficient documentation

## 2014-06-17 DIAGNOSIS — Z8673 Personal history of transient ischemic attack (TIA), and cerebral infarction without residual deficits: Secondary | ICD-10-CM | POA: Diagnosis not present

## 2014-06-17 DIAGNOSIS — J449 Chronic obstructive pulmonary disease, unspecified: Secondary | ICD-10-CM | POA: Insufficient documentation

## 2014-06-17 DIAGNOSIS — M1990 Unspecified osteoarthritis, unspecified site: Secondary | ICD-10-CM | POA: Insufficient documentation

## 2014-06-17 DIAGNOSIS — R109 Unspecified abdominal pain: Secondary | ICD-10-CM | POA: Diagnosis present

## 2014-06-17 DIAGNOSIS — K219 Gastro-esophageal reflux disease without esophagitis: Secondary | ICD-10-CM | POA: Insufficient documentation

## 2014-06-17 NOTE — ED Notes (Signed)
Pt from home c/o right flank pain that radiates to the right abdomen. Denies urinary symptoms. Hx of flank pain. Pt took motrin without relief.

## 2014-06-18 ENCOUNTER — Emergency Department (HOSPITAL_COMMUNITY): Payer: Medicare Other

## 2014-06-18 DIAGNOSIS — N390 Urinary tract infection, site not specified: Secondary | ICD-10-CM | POA: Diagnosis not present

## 2014-06-18 LAB — COMPREHENSIVE METABOLIC PANEL
ALT: 12 U/L — ABNORMAL LOW (ref 14–54)
AST: 11 U/L — ABNORMAL LOW (ref 15–41)
Albumin: 3.5 g/dL (ref 3.5–5.0)
Alkaline Phosphatase: 74 U/L (ref 38–126)
Anion gap: 8 (ref 5–15)
BUN: 17 mg/dL (ref 6–20)
CO2: 23 mmol/L (ref 22–32)
Calcium: 9.1 mg/dL (ref 8.9–10.3)
Chloride: 109 mmol/L (ref 101–111)
Creatinine, Ser: 0.98 mg/dL (ref 0.44–1.00)
GFR calc Af Amer: >60 mL/min (ref >60)
GFR calc non Af Amer: >60 mL/min (ref >60)
Glucose, Bld: 188 mg/dL — ABNORMAL HIGH (ref 65–99)
Potassium: 3 mmol/L — ABNORMAL LOW (ref 3.5–5.1)
Sodium: 140 mmol/L (ref 135–145)
Total Bilirubin: <0.1 mg/dL — ABNORMAL LOW (ref 0.3–1.2)
Total Protein: 7.3 g/dL (ref 6.5–8.1)

## 2014-06-18 LAB — CBC WITH DIFFERENTIAL/PLATELET
Basophils Absolute: 0 10*3/uL (ref 0.0–0.1)
Basophils Relative: 0 % (ref 0–1)
Eosinophils Absolute: 0 10*3/uL (ref 0.0–0.7)
Eosinophils Relative: 0 % (ref 0–5)
HCT: 36.1 % (ref 36.0–46.0)
Hemoglobin: 11.3 g/dL — ABNORMAL LOW (ref 12.0–15.0)
Lymphocytes Relative: 8 % — ABNORMAL LOW (ref 12–46)
Lymphs Abs: 0.5 10*3/uL — ABNORMAL LOW (ref 0.7–4.0)
MCH: 28.8 pg (ref 26.0–34.0)
MCHC: 31.3 g/dL (ref 30.0–36.0)
MCV: 91.9 fL (ref 78.0–100.0)
Monocytes Absolute: 0 10*3/uL — ABNORMAL LOW (ref 0.1–1.0)
Monocytes Relative: 1 % — ABNORMAL LOW (ref 3–12)
Neutro Abs: 5.2 10*3/uL (ref 1.7–7.7)
Neutrophils Relative %: 91 % — ABNORMAL HIGH (ref 43–77)
Platelets: 202 10*3/uL (ref 150–400)
RBC: 3.93 MIL/uL (ref 3.87–5.11)
RDW: 13.2 % (ref 11.5–15.5)
WBC: 5.7 10*3/uL (ref 4.0–10.5)

## 2014-06-18 LAB — URINALYSIS, ROUTINE W REFLEX MICROSCOPIC
Bilirubin Urine: NEGATIVE
Glucose, UA: 100 mg/dL — AB
Ketones, ur: NEGATIVE mg/dL
Nitrite: POSITIVE — AB
Protein, ur: NEGATIVE mg/dL
Specific Gravity, Urine: 1.017 (ref 1.005–1.030)
Urobilinogen, UA: 0.2 mg/dL (ref 0.0–1.0)
pH: 6.5 (ref 5.0–8.0)

## 2014-06-18 LAB — URINE MICROSCOPIC-ADD ON

## 2014-06-18 LAB — LIPASE, BLOOD: Lipase: 22 U/L (ref 22–51)

## 2014-06-18 MED ORDER — POTASSIUM CHLORIDE CRYS ER 20 MEQ PO TBCR: 40.0000 meq | EXTENDED_RELEASE_TABLET | Freq: Every day | ORAL | Status: DC

## 2014-06-18 MED ORDER — OXYCODONE-ACETAMINOPHEN 5-325 MG PO TABS: 1.0000 | ORAL_TABLET | Freq: Four times a day (QID) | ORAL | Status: DC | PRN

## 2014-06-18 MED ORDER — CIPROFLOXACIN HCL 500 MG PO TABS: 500.0000 mg | ORAL_TABLET | Freq: Two times a day (BID) | ORAL | Status: DC

## 2014-06-18 MED ORDER — HYDROMORPHONE HCL 1 MG/ML IJ SOLN
1.0000 mg | Freq: Once | INTRAMUSCULAR | Status: AC
  Administered 2014-06-18: 1 mg via INTRAVENOUS
  Filled 2014-06-18: qty 1

## 2014-06-18 MED ORDER — KETOROLAC TROMETHAMINE 30 MG/ML IJ SOLN
30.0000 mg | Freq: Once | INTRAMUSCULAR | Status: AC
  Administered 2014-06-18: 30 mg via INTRAMUSCULAR
  Filled 2014-06-18: qty 1

## 2014-06-18 MED ORDER — POTASSIUM CHLORIDE CRYS ER 20 MEQ PO TBCR
20.0000 meq | EXTENDED_RELEASE_TABLET | Freq: Once | ORAL | Status: AC
  Administered 2014-06-18: 20 meq via ORAL
  Filled 2014-06-18: qty 1

## 2014-06-18 MED ORDER — ONDANSETRON HCL 4 MG/2ML IJ SOLN
4.0000 mg | Freq: Once | INTRAMUSCULAR | Status: AC
  Administered 2014-06-18: 4 mg via INTRAVENOUS
  Filled 2014-06-18: qty 2

## 2014-06-18 MED ORDER — CIPROFLOXACIN HCL 500 MG PO TABS
500.0000 mg | ORAL_TABLET | Freq: Once | ORAL | Status: AC
  Administered 2014-06-18: 500 mg via ORAL
  Filled 2014-06-18: qty 1

## 2014-06-18 MED ORDER — ONDANSETRON 4 MG PO TBDP: 4.0000 mg | ORAL_TABLET | Freq: Three times a day (TID) | ORAL | Status: DC | PRN

## 2014-06-18 MED ORDER — SODIUM CHLORIDE 0.9 % IV SOLN
Freq: Once | INTRAVENOUS | Status: AC
  Administered 2014-06-18: 02:00:00 via INTRAVENOUS

## 2014-06-18 NOTE — Discharge Instructions (Signed)
As discussed you have a kidney stone on the right with multiple small stones in the rest of the urinary collecting system you have hydronephrosis or swelling of the kidney and ureter is important that you make an appointment with your urologist or Dr. Jeffie Pollock, for further evaluation this week.  You have been given a prescription for Cipro to treat her urinary tract infection.  You been given prescriptions for pain control and antiemetics to control nausea and pain.  If you develop pain despite the use of the above-named medications.  Please return to the emergency room for further evaluation, treatment and possible admission to the hospital

## 2014-06-18 NOTE — ED Provider Notes (Addendum)
CSN: [id number]     Arrival date & time 06/17/14  2322 History   First MD Initiated Contact with Patient 06/18/14 0134     Chief Complaint  Patient presents with  . Flank Pain     (Consider location/radiation/quality/duration/timing/severity/associated sxs/prior Treatment) HPI Comments: Flank pain that radiated to abdomen for the past 24 hours   The history is provided by the patient.    Past Medical History  Diagnosis Date  . Hypertension   . Asthma   . Seizures   . Heart murmur     born with   . Dysrhythmia     skipped beats  . Myocardial infarction     hi of x 2  last one 2003   . COPD (chronic obstructive pulmonary disease)     chronic bronchitis   . Pneumonia     hx of 2009  . GERD (gastroesophageal reflux disease)   . Headache(784.0)     migraines   . Neuropathy     due to diabetes   . Dizziness     secondary to diabetes and hypertension   . Arthritis     joint pain   . Hx of blood clots     hematomas removed from left side of brain from 88mo to 89yrold   . Epilepsy idiopathic petit mal     last seizure 2012;controlled w/ topomax  . Diabetic neuropathy, painful     FEET AND HANDS  . Diabetes mellitus     since age [age]type 2 IDDM  . Sleep apnea     [facility];does not use Cpap ; mild  . MIGRAINE HEADACHE 09/14/2006    Qualifier: Diagnosis of  By: MaHassell DoneNP, NyTori Milks  . Glaucoma     NOT ON ANY EYE DROPS   . Legally blind     left eye   . Stroke     last 2003  RESIDUAL LEFT LEG WEAKNESS--NO OTHER RESIDUAL PROBLEMS  . CVA 11/14/2007    Qualifier: Diagnosis of  By: MaHassell DoneNP, NyTori Milks  . Pain 09/23/11    LOWER BACK AND RIGHT SIDE--PT HAS RIGHT URETERAL STONE  . Nephrolithiasis     frequent urination , urination at nite  PT SEEN IN ER 09/22/11 FOR BACK AND RT SIDED PAIN--HAS KNOWN STONE RT URETER AND UA IN ER SHOWED UTI  . Anxiety   . Depression   . Stress incontinence   . Hyperlipidemia   . Pyelonephritis    Past Surgical  History  Procedure Laterality Date  . Kidney stone removal    . Brain hematoma evacuation      five procedures total, first procedure when [age]onths old, last at 49 years of age.  . Shunt removal      shunt inserted at age 27 1emoved at age 49 . Eye surgery    . Retinal detachment surgery  1990  . Vaginal hysterectomy  1996  . Incisional hernia repair  05/05/2011    Procedure: LAPAROSCOPIC INCISIONAL HERNIA;  Surgeon: MaRolm BookbinderMD;  [place] [facility] [place] Service: [facility];  Laterality: N/A;  . Mass excision  05/05/2011    Procedure: EXCISION MASS;  Surgeon: MaRolm BookbinderMD;  [place] [facility]: General;  Laterality: Right;  . Hernia repair  05/05/11    lap incisional hernia  . Pcnl    . Cystoscopy/retrograde/ureteroscopy  08/24/2011    Procedure: CYSTOSCOPY/RETROGRADE/URETEROSCOPY;  Surgeon: DaMolli HazardMD;  [facility]: [facility];  Service: Urology;  Laterality: Right;  Cysto, Right retrograde Pyelogram, right stent placement.   . Cystoscopy with ureteroscopy  09/24/2011    Procedure: CYSTOSCOPY WITH URETEROSCOPY;  Surgeon: Malka So, MD;  Location: [facility];  Service: Urology;  Laterality: Right;  Balloon dilation right ureter   . Cystoscopy w/ retrogrades  09/24/2011    Procedure: CYSTOSCOPY WITH RETROGRADE PYELOGRAM;  Surgeon: Malka So, MD;  Location: [facility];  Service: Urology;  Laterality: Right;   Family History  Problem Relation Age of Onset  . Cancer Mother     uterine  . Hypertension Mother   . Cancer Maternal Grandmother     brain  . Hypertension Maternal Grandmother   . Anesthesia problems Neg Hx    History  Substance Use Topics  . Smoking status: Former Smoker -- 1.00 packs/day for 30 years    Types: Cigarettes    Quit date: 01/20/1987  . Smokeless tobacco: Never Used     Comment: quit more than 20years  . Alcohol Use: No   OB History    Gravida Para Term Preterm AB TAB SAB Ectopic Multiple Living   3 2 2  1  1   2      Review of Systems   Gastrointestinal: Positive for abdominal pain.  Genitourinary: Positive for frequency and flank pain.      Allergies  Codeine and Lactose intolerance (gi)  Home Medications   Prior to Admission medications   Medication Sig Start Date End Date Taking? Authorizing Provider  baclofen (LIORESAL) 10 MG tablet TAKE 1 TABLET BY MOUTH THREE TIMES A DAY AS NEEDED FOR MUSCLE SPASMS 01/26/14  Yes Gildardo Cranker, DO  clopidogrel (PLAVIX) 75 MG tablet TAKE 1 TABLET BY MOUTH EVERY DAY 01/25/14  Yes Lauree Chandler, NP  FLUoxetine (PROZAC) 20 MG capsule Take 3 capsules (60 mg total) by mouth daily at 12 noon. 12/29/13  Yes Norma Fredrickson, MD  fluticasone (FLONASE) 50 MCG/ACT nasal spray Place 2 sprays into both nostrils daily.   Yes Historical Provider, MD  gabapentin (NEURONTIN) 300 MG capsule TAKE 2 CAPSULES BY MOUTH THREE TIMES A DAY 05/24/14  Yes Lauree Chandler, NP  ibuprofen (ADVIL,MOTRIN) 600 MG tablet TAKE 1 TABLET BY MOUTH EVERY 8 HOURS AS NEEDED FOR PAIN 04/26/14  Yes Lauree Chandler, NP  Insulin Glargine (TOUJEO SOLOSTAR) 300 UNIT/ML SOPN Inject 93 Units into the skin daily. 05/29/14  Yes Lauree Chandler, NP  Insulin Lispro, Human, (HUMALOG KWIKPEN) 200 UNIT/ML SOPN Inject 5 Units into the skin 3 (three) times daily with meals. 03/15/14  Yes Lauree Chandler, NP  lisinopril (PRINIVIL,ZESTRIL) 40 MG tablet Take 1 tablet (40 mg total) by mouth daily. 01/16/14  Yes Mahima Pandey, MD  MYRBETRIQ 25 MG TB24 tablet TAKE 1 TABLET BY MOUTH EVERY DAY 05/28/14  Yes Tiffany L Reed, DO  pantoprazole (PROTONIX) 40 MG tablet TAKE 1 TABLET BY MOUTH DAILY 12/04/13  Yes Lauree Chandler, NP  polyethylene glycol (MIRALAX) packet Take 17 g by mouth daily. 03/01/14  Yes Modena Jansky, MD  PROAIR HFA 108 (90 BASE) MCG/ACT inhaler INHALE 2 PUFFS BY MOUTH EVERY 6 HOURS AS NEEDED FOR WHEEZING OR SHORTNESS OF BREATH 04/26/14  Yes Lauree Chandler, NP  ranitidine (ZANTAC) 150 MG tablet TAKE 1 TABLET BY MOUTH TWICE DAILY  12/04/13  Yes Lauree Chandler, NP  simvastatin (ZOCOR) 20 MG tablet TAKE 1 TABLET BY MOUTH EVERY MORNING FOR CHOLESTROL 04/26/14  Yes  Lauree Chandler, NP  SYMBICORT 160-4.5 MCG/ACT inhaler INHALE 2 PUFFS BY MOUTH TWICE DAILY 05/24/14  Yes Lauree Chandler, NP  topiramate (TOPAMAX) 200 MG tablet TAKE 1 TABLET BY MOUTH TWICE DAILY 03/29/14  Yes Tiffany L Reed, DO  traZODone (DESYREL) 150 MG tablet Take 150 mg by mouth 2 (two) times daily.  05/29/14  Yes Lauree Chandler, NP  verapamil (VERELAN PM) 240 MG 24 hr capsule TAKE 1 CAPSULE BY MOUTH EVERY NIGHT AT BEDTIME FOR BLOOD PRESSURE 03/29/14  Yes Tiffany L Reed, DO  ciprofloxacin (CIPRO) 500 MG tablet Take 1 tablet (500 mg total) by mouth 2 (two) times daily. 06/18/14   Junius Creamer, NP  EASY TOUCH PEN NEEDLES 31G X 8 MM MISC USE AS DIRECTED TO INJECT INSULIN DAILY 01/25/14   Lauree Chandler, NP  ondansetron (ZOFRAN ODT) 4 MG disintegrating tablet Take 1 tablet (4 mg total) by mouth every 8 (eight) hours as needed for nausea or vomiting. 06/18/14   Junius Creamer, NP  ONE TOUCH ULTRA TEST test strip USE TO CHECK BLOOD SUGAR FOUR TIMES A DAY 04/26/14   Lauree Chandler, NP  [facility] DELICA LANCETS 00F MISC TEST BLOOD SUGAR FOUR TIMES A DAY 01/25/14   Lauree Chandler, NP  oxyCODONE-acetaminophen (PERCOCET/ROXICET) 5-325 MG per tablet Take 1-2 tablets by mouth every 6 (six) hours as needed for severe pain. 06/18/14   Junius Creamer, NP  potassium chloride SA (K-DUR,KLOR-CON) 20 MEQ tablet Take 2 tablets (40 mEq total) by mouth daily. 06/18/14   Junius Creamer, NP  Vitamin D, Ergocalciferol, (DRISDOL) 50000 UNITS CAPS capsule Take 1 capsule (50,000 Units total) by mouth every Thursday. 04/26/14   Lauree Chandler, NP   BP 173/95 mmHg  Pulse 96  Temp(Src) 98.9 F (37.2 C) (Oral)  Resp 18  SpO2 100% Physical Exam  Constitutional: She appears well-developed and well-nourished.  HENT:  Head: Normocephalic.  Eyes: Pupils are equal, round, and reactive to light.  Neck:  Normal range of motion.  Cardiovascular: Normal rate and regular rhythm.   Pulmonary/Chest: Effort normal.  Abdominal: Soft. She exhibits no distension. There is tenderness in the right upper quadrant and right lower quadrant.  Musculoskeletal: Normal range of motion.  Neurological: She is alert.  Skin: Skin is warm. No rash noted.  Nursing note and vitals reviewed.   ED Course  Procedures (including critical care time) Labs Review Labs Reviewed  CBC WITH DIFFERENTIAL/PLATELET - Abnormal; Notable for the following:    Hemoglobin 11.3 (*)    Neutrophils Relative % 91 (*)    Lymphocytes Relative 8 (*)    Lymphs Abs 0.5 (*)    Monocytes Relative 1 (*)    Monocytes Absolute 0.0 (*)    All other components within normal limits  COMPREHENSIVE METABOLIC PANEL - Abnormal; Notable for the following:    Potassium 3.0 (*)    Glucose, Bld 188 (*)    AST 11 (*)    ALT 12 (*)    Total Bilirubin <0.1 (*)    All other components within normal limits  URINALYSIS, ROUTINE W REFLEX MICROSCOPIC (NOT AT [facility]) - Abnormal; Notable for the following:    APPearance TURBID (*)    Glucose, UA 100 (*)    Hgb urine dipstick SMALL (*)    Nitrite POSITIVE (*)    Leukocytes, UA LARGE (*)    All other components within normal limits  URINE MICROSCOPIC-ADD ON - Abnormal; Notable for the following:    Squamous Epithelial /  LPF MANY (*)    Bacteria, UA FEW (*)    All other components within normal limits  LIPASE, BLOOD    Imaging Review Ct Renal Stone Study  06/18/2014   CLINICAL DATA:  Acute onset of right flank pain. Red blood cells and white blood cells in the urine. Initial encounter.  EXAM: CT ABDOMEN AND PELVIS WITHOUT CONTRAST  TECHNIQUE: Multidetector CT imaging of the abdomen and pelvis was performed following the standard protocol without IV contrast.  COMPARISON:  CT of the abdomen and pelvis from 02/27/2014  FINDINGS: Trace bilateral pleural effusions are seen. Bibasilar atelectasis is noted.   The liver and spleen are unremarkable in appearance. The gallbladder is within normal limits. The pancreas and adrenal glands are unremarkable.  There is relatively severe right-sided hydronephrosis, with right-sided perinephric stranding and fluid, and prominence of the right ureter to the level of an obstructing 5 x 4 mm stone in the distal right ureter, 7 cm above the right vesicoureteral junction. Scattered nonobstructing right renal stones are seen, measuring up to 7 mm in size. A tiny 2 mm stone is noted at the upper pole of the left kidney. The left kidney is otherwise unremarkable.  No free fluid is identified. The small bowel is unremarkable in appearance. The stomach is within normal limits. No acute vascular abnormalities are seen.  The appendix is normal in caliber, without evidence of appendicitis. Mild diverticulosis is noted along the descending and proximal sigmoid colon, without evidence of diverticulitis.  The bladder is mildly distended and grossly unremarkable. The patient is status post hysterectomy. No suspicious adnexal masses are seen. The ovaries are relatively symmetric. No inguinal lymphadenopathy is seen.  No acute osseous abnormalities are identified.  IMPRESSION: 1. Relatively severe right-sided hydronephrosis, with right-sided perinephric stranding and fluid. Obstructing 5 x 4 mm stone noted distally in the right ureter, 7 cm above the right vesicoureteral junction. 2. Scattered nonobstructing bilateral renal stones, predominantly on the right, measuring up to 7 mm in size. 3. Trace bilateral pleural effusions.  Bibasilar atelectasis noted. 4. Mild diverticulosis along the descending and proximal sigmoid colon, without evidence of diverticulitis.   Electronically Signed   By: Garald Balding M.D.   On: 06/18/2014 03:10     EKG Interpretation None     patient.  CT scan shows that she has not hydronephrosis and a large 4 x 5 stone.  Her BUN/creatinine are normal.  She does have a  urinary tract infection.  She will be treated with antivirals for her UTI given pain and antiemetics control and follow-up with her urologist this week if, despite the use of these medications.  She has pain that cannot be controlled at home.  She is to come back to the emergency room in for further treatment and possible admission  MDM   Final diagnoses:  Flank pain  UTI (lower urinary tract infection)  Kidney stone  Hypokalemia         Junius Creamer, NP 06/18/14 9470  Junius Creamer, NP 06/18/14 9628  Rolland Porter, MD 06/18/14 3662  Junius Creamer, NP 06/28/14 [place], MD 07/02/14 2307

## 2014-06-21 ENCOUNTER — Ambulatory Visit (INDEPENDENT_AMBULATORY_CARE_PROVIDER_SITE_OTHER): Payer: Medicare Other | Admitting: Nurse Practitioner

## 2014-06-21 ENCOUNTER — Encounter: Payer: Self-pay | Admitting: Nurse Practitioner

## 2014-06-21 VITALS — BP 140/90 | HR 91 | Temp 98.3°F | Resp 18 | Ht 66.0 in | Wt 225.2 lb

## 2014-06-21 DIAGNOSIS — N390 Urinary tract infection, site not specified: Secondary | ICD-10-CM

## 2014-06-21 DIAGNOSIS — E876 Hypokalemia: Secondary | ICD-10-CM

## 2014-06-21 DIAGNOSIS — B009 Herpesviral infection, unspecified: Secondary | ICD-10-CM | POA: Diagnosis not present

## 2014-06-21 DIAGNOSIS — N200 Calculus of kidney: Secondary | ICD-10-CM

## 2014-06-21 NOTE — Progress Notes (Signed)
Patient ID: [name], female   DOB: 1965-02-26, 49 y.o.   MRN: [id number]    PCP: Lauree Chandler, NP  Allergies  Allergen Reactions  . Codeine Swelling and Other (See Comments)    Swelling and burning of mouth (inside)  . Lactose Intolerance (Gi) Nausea And Vomiting    Chief Complaint  Patient presents with  . OTHER    Discuss getting a script for herpes mediation     HPI: Patient is a 49 y.o. female seen in the office today to follow up on herpes medication. [facility] exactly [facility]) was found to have a yeast infection as well as herpes simplex. Was placed on valtrex, completes course which cleared symptoms and has not had recurrence since.  Pt also was seen in the ED due to severe right flank pain 3 days ago and diagnosed with UTI and renal stone and started on Cipro which she is taking and tolerating well.   Impression of CT revealed Relatively severe right-sided hydronephrosis, with right-sided perinephric stranding and fluid. Obstructing 5 x 4 mm stone noted distally in the right ureter, 7 cm above the right vesicoureteral junction. And Scattered nonobstructing bilateral renal stones, predominantly on the right, measuring up to 7 mm in size. Pt has appt with urology tomorrow. Pain is still severe. Taking percocet and ibuprofen for pain which helps a little.  No fevers or chills noted.   Review of Systems:  Review of Systems  Constitutional: Negative for activity change, appetite change, fatigue and unexpected weight change.  Respiratory: Negative for cough and shortness of breath.        Hx of sleep apnea has CPAP  Cardiovascular: Negative for chest pain, palpitations and leg swelling.  Gastrointestinal: Positive for abdominal pain (right sided) and constipation (at times). Negative for nausea, diarrhea and abdominal distention.  Genitourinary: Positive for flank pain. Negative for dysuria, urgency and frequency.    Musculoskeletal: Positive for myalgias and back pain.       Chronic pain  Skin: Negative for rash.  Neurological: Negative for dizziness, seizures (no seizures since 2014), weakness and headaches.  Psychiatric/Behavioral: Negative for suicidal ideas and hallucinations.    Past Medical History  Diagnosis Date  . Hypertension   . Asthma   . Seizures   . Heart murmur     born with   . Dysrhythmia     skipped beats  . Myocardial infarction     hi of x 2  last one 2003   . COPD (chronic obstructive pulmonary disease)     chronic bronchitis   . Pneumonia     hx of 2009  . GERD (gastroesophageal reflux disease)   . Headache(784.0)     migraines   . Neuropathy     due to diabetes   . Dizziness     secondary to diabetes and hypertension   . Arthritis     joint pain   . Hx of blood clots     hematomas removed from left side of brain from 3mo to 89yrold   . Epilepsy idiopathic petit mal     last seizure 2012;controlled w/ topomax  . Diabetic neuropathy, painful     FEET AND HANDS  . Diabetes mellitus     since age [age]type 2 IDDM  . Sleep apnea     [facility];does not use Cpap ; mild  . MIGRAINE HEADACHE 09/14/2006  Qualifier: Diagnosis of  By: Hassell Done [name], Tori Milks    . Glaucoma     NOT ON ANY EYE DROPS   . Legally blind     left eye   . Stroke     last 2003  RESIDUAL LEFT LEG WEAKNESS--NO OTHER RESIDUAL PROBLEMS  . CVA 11/14/2007    Qualifier: Diagnosis of  By: Hassell Done [name], Tori Milks    . Pain 09/23/11    LOWER BACK AND RIGHT SIDE--PT HAS RIGHT URETERAL STONE  . Nephrolithiasis     frequent urination , urination at nite  PT SEEN IN ER 09/22/11 FOR BACK AND RT SIDED PAIN--HAS KNOWN STONE RT URETER AND UA IN ER SHOWED UTI  . Anxiety   . Depression   . Stress incontinence   . Hyperlipidemia   . Pyelonephritis    Past Surgical History  Procedure Laterality Date  . Kidney stone removal    . Brain hematoma evacuation      five procedures total,  first procedure when 36 months old, last at 49 years of age.  . Shunt removal      shunt inserted at age 41 removed at age 70   . Eye surgery    . Retinal detachment surgery  1990  . Vaginal hysterectomy  1996  . Incisional hernia repair  05/05/2011    Procedure: LAPAROSCOPIC INCISIONAL HERNIA;  Surgeon: Rolm Bookbinder, MD;  Location: [place];  Service: General;  Laterality: N/A;  . Mass excision  05/05/2011    Procedure: EXCISION MASS;  Surgeon: Rolm Bookbinder, MD;  Location: [place];  Service: General;  Laterality: Right;  . Hernia repair  05/05/11    lap incisional hernia  . Pcnl    . Cystoscopy/retrograde/ureteroscopy  08/24/2011    Procedure: CYSTOSCOPY/RETROGRADE/URETEROSCOPY;  Surgeon: Molli Hazard, MD;  Location: [facility];  Service: Urology;  Laterality: Right;  Cysto, Right retrograde Pyelogram, right stent placement.   . Cystoscopy with ureteroscopy  09/24/2011    Procedure: CYSTOSCOPY WITH URETEROSCOPY;  Surgeon: Malka So, MD;  Location[facility];  Service: Urology;  Laterality: Right;  Balloon dilation right ureter   . Cystoscopy w/ retrogrades  09/24/2011    Procedure: CYSTOSCOPY WITH RETROGRADE PYELOGRAM;  Surgeon: Malka So, MD;  Location[facility];  Service: Urology;  Laterality: Right;   Social History:   reports that she quit smoking about 27 years ago. Her smoking use included Cigarettes. She has a 30 pack-year smoking history. She has never used smokeless tobacco. She reports that she does not drink alcohol or use illicit drugs.  Family History  Problem Relation Age of Onset  . Cancer Mother     uterine  . Hypertension Mother   . Cancer Maternal Grandmother     brain  . Hypertension Maternal Grandmother   . Anesthesia problems Neg Hx     Medications: Patient's Medications  New Prescriptions   No medications on file  Previous Medications   BACLOFEN (LIORESAL) 10 MG TABLET    TAKE 1 TABLET BY MOUTH THREE TIMES A DAY AS NEEDED FOR MUSCLE SPASMS    CIPROFLOXACIN (CIPRO) 500 MG TABLET    Take 1 tablet (500 mg total) by mouth 2 (two) times daily.   CLOPIDOGREL (PLAVIX) 75 MG TABLET    TAKE 1 TABLET BY MOUTH EVERY DAY   EASY TOUCH PEN NEEDLES 31G X 8 MM MISC    USE AS DIRECTED TO INJECT INSULIN DAILY   FLUOXETINE (PROZAC) 20 MG CAPSULE    Take  3 capsules (60 mg total) by mouth daily at 12 noon.   FLUTICASONE (FLONASE) 50 MCG/ACT NASAL SPRAY    Place 2 sprays into both nostrils daily.   GABAPENTIN (NEURONTIN) 300 MG CAPSULE    TAKE 2 CAPSULES BY MOUTH THREE TIMES A DAY   IBUPROFEN (ADVIL,MOTRIN) 600 MG TABLET    TAKE 1 TABLET BY MOUTH EVERY 8 HOURS AS NEEDED FOR PAIN   INSULIN GLARGINE (TOUJEO SOLOSTAR) 300 UNIT/ML SOPN    Inject 93 Units into the skin daily.   INSULIN LISPRO, HUMAN, (HUMALOG KWIKPEN) 200 UNIT/ML SOPN    Inject 5 Units into the skin 3 (three) times daily with meals.   LISINOPRIL (PRINIVIL,ZESTRIL) 40 MG TABLET    Take 1 tablet (40 mg total) by mouth daily.   MYRBETRIQ 25 MG TB24 TABLET    TAKE 1 TABLET BY MOUTH EVERY DAY   ONDANSETRON (ZOFRAN ODT) 4 MG DISINTEGRATING TABLET    Take 1 tablet (4 mg total) by mouth every 8 (eight) hours as needed for nausea or vomiting.   ONE TOUCH ULTRA TEST TEST STRIP    USE TO CHECK BLOOD SUGAR FOUR TIMES A DAY   ONETOUCH DELICA LANCETS 42H MISC    TEST BLOOD SUGAR FOUR TIMES A DAY   OXYCODONE-ACETAMINOPHEN (PERCOCET/ROXICET) 5-325 MG PER TABLET    Take 1-2 tablets by mouth every 6 (six) hours as needed for severe pain.   PANTOPRAZOLE (PROTONIX) 40 MG TABLET    TAKE 1 TABLET BY MOUTH DAILY   POLYETHYLENE GLYCOL (MIRALAX) PACKET    Take 17 g by mouth daily.   POTASSIUM CHLORIDE SA (K-DUR,KLOR-CON) 20 MEQ TABLET    Take 2 tablets (40 mEq total) by mouth daily.   PROAIR HFA 108 (90 BASE) MCG/ACT INHALER    INHALE 2 PUFFS BY MOUTH EVERY 6 HOURS AS NEEDED FOR WHEEZING OR SHORTNESS OF BREATH   RANITIDINE (ZANTAC) 150 MG TABLET    TAKE 1 TABLET BY MOUTH TWICE DAILY   SIMVASTATIN (ZOCOR) 20 MG TABLET     TAKE 1 TABLET BY MOUTH EVERY MORNING FOR CHOLESTROL   SYMBICORT 160-4.5 MCG/ACT INHALER    INHALE 2 PUFFS BY MOUTH TWICE DAILY   TOPIRAMATE (TOPAMAX) 200 MG TABLET    TAKE 1 TABLET BY MOUTH TWICE DAILY   TRAZODONE (DESYREL) 150 MG TABLET    Take 150 mg by mouth 2 (two) times daily.    VERAPAMIL (VERELAN PM) 240 MG 24 HR CAPSULE    TAKE 1 CAPSULE BY MOUTH EVERY NIGHT AT BEDTIME FOR BLOOD PRESSURE   VITAMIN D, ERGOCALCIFEROL, (DRISDOL) 50000 UNITS CAPS CAPSULE    Take 1 capsule (50,000 Units total) by mouth every Thursday.  Modified Medications   No medications on file  Discontinued Medications   No medications on file     Physical Exam:  Filed Vitals:   06/21/14 1432  BP: 140/90  Pulse: 91  Temp: 98.3 F (36.8 C)  TempSrc: Oral  Resp: 18  Height: 5' 6"  (1.676 m)  Weight: 225 lb 3.2 oz (102.15 kg)  SpO2: 97%    Physical Exam  Constitutional: She is oriented to person, place, and time. She appears well-developed and well-nourished. No distress.  HENT:  Head: Normocephalic and atraumatic.  Neck: Normal range of motion. Neck supple.  Cardiovascular: Normal rate, regular rhythm and normal heart sounds.   Pulmonary/Chest: Effort normal and breath sounds normal.  Abdominal: Soft. Bowel sounds are normal. She exhibits no distension. There is tenderness (right sided abdominal and  cva tenderness ).  Musculoskeletal: Normal range of motion. She exhibits no edema or tenderness.  Neurological: She is alert and oriented to person, place, and time.  Skin: Skin is warm and dry. She is not diaphoretic.  Psychiatric: She has a normal mood and affect.    Labs reviewed: Basic Metabolic Panel:  Recent Labs  02/28/14 0615 03/15/14 1524 06/18/14 0001  NA 137 139 140  K 3.5 4.4 3.0*  CL 109 103 109  CO2 22 21 23   GLUCOSE 88 167* 188*  BUN 6 17 17   CREATININE 0.81 0.89 0.98  CALCIUM 8.8 10.0 9.1   Liver Function Tests:  Recent Labs  02/22/14 1000 02/23/14 0530 06/18/14 0001    AST 12 14 11*  ALT 12 13 12*  ALKPHOS 76 69 74  BILITOT 0.4 0.4 <0.1*  PROT 7.0 6.4 7.3  ALBUMIN 3.2* 3.0* 3.5    Recent Labs  06/18/14 0001  LIPASE 22   No results for input(s): AMMONIA in the last 8760 hours. CBC:  Recent Labs  02/22/14 1723  02/27/14 1026 03/15/14 1524 06/18/14 0001  WBC 6.7  < > 7.3 4.8 5.7  NEUTROABS 4.7  --   --  2.4 5.2  HGB 12.0  < > 12.1 13.0 11.3*  HCT 37.0  < > 37.1 39.7 36.1  MCV 92.5  < > 91.8 91 91.9  PLT 212  < > 286 285 202  < > = values in this interval not displayed. Lipid Panel:  Recent Labs  09/07/13 0941 12/25/13 0911  CHOL 159 161  HDL 43 38*  LDLCALC 98 97  TRIG 90 131  CHOLHDL 3.7 4.2   TSH: No results for input(s): TSH in the last 8760 hours. A1C: Lab Results  Component Value Date   HGBA1C 8.8* 05/24/2014    IMPRESSION: 1. Relatively severe right-sided hydronephrosis, with right-sided perinephric stranding and fluid. Obstructing 5 x 4 mm stone noted distally in the right ureter, 7 cm above the right vesicoureteral junction. 2. Scattered nonobstructing bilateral renal stones, predominantly on the right, measuring up to 7 mm in size. 3. Trace bilateral pleural effusions. Bibasilar atelectasis noted. 4. Mild diverticulosis along the descending and proximal sigmoid colon, without evidence of diverticulitis.   Assessment/Plan 1. Hypokalemia -potassium of 3.0 found in the ED, potassium supplements given, finishes today, will follow up lab - Basic metabolic panel  2. Urinary tract infection without hematuria, site unspecified Being treated with cipro for UTI   3. Kidney stone CT scan revealed Relatively severe right-sided hydronephrosis, with right-sided perinephric stranding and fluid. Obstructing 5 x 4 mm stone noted  distally in the right ureter, 7 cm above the right vesicoureteral junction.  Scattered nonobstructing bilateral renal stones, predominantly on the right, measuring up to 7 mm in size. Pt with  urology follow up early next week, conts on percocet for pain  4. Herpes simplex Dx in November 2015, no further outbreaks since original diagnosis. At this time will not give suppressive therapy, discussed if recurrent episodes then that time time would consider based on frequency and severity

## 2014-06-21 NOTE — Patient Instructions (Signed)
Blood work today to follow up on potassium  continue Cipro-- follow up with urology tomorrow  No need for suppressive therapy at this time

## 2014-06-22 ENCOUNTER — Other Ambulatory Visit: Payer: Self-pay | Admitting: Nurse Practitioner

## 2014-06-22 ENCOUNTER — Encounter: Payer: Self-pay | Admitting: *Deleted

## 2014-06-22 LAB — BASIC METABOLIC PANEL
BUN/Creatinine Ratio: 15 (ref 9–23)
BUN: 16 mg/dL (ref 6–24)
CO2: 23 mmol/L (ref 18–29)
Calcium: 9.1 mg/dL (ref 8.7–10.2)
Chloride: 105 mmol/L (ref 97–108)
Creatinine, Ser: 1.09 mg/dL — ABNORMAL HIGH (ref 0.57–1.00)
GFR calc Af Amer: 69 mL/min/{1.73_m2} (ref >59)
GFR calc non Af Amer: 60 mL/min/{1.73_m2} (ref >59)
Glucose: 193 mg/dL — ABNORMAL HIGH (ref 65–99)
Potassium: 4 mmol/L (ref 3.5–5.2)
Sodium: 142 mmol/L (ref 134–144)

## 2014-07-18 ENCOUNTER — Encounter: Payer: Self-pay | Admitting: Pulmonary Disease

## 2014-07-18 ENCOUNTER — Encounter (INDEPENDENT_AMBULATORY_CARE_PROVIDER_SITE_OTHER): Payer: Self-pay

## 2014-07-18 ENCOUNTER — Ambulatory Visit (INDEPENDENT_AMBULATORY_CARE_PROVIDER_SITE_OTHER): Payer: Medicare Other | Admitting: Pulmonary Disease

## 2014-07-18 VITALS — BP 132/72 | HR 80 | Ht 66.0 in | Wt 225.8 lb

## 2014-07-18 DIAGNOSIS — J984 Other disorders of lung: Secondary | ICD-10-CM

## 2014-07-18 DIAGNOSIS — G4733 Obstructive sleep apnea (adult) (pediatric): Secondary | ICD-10-CM | POA: Diagnosis not present

## 2014-07-18 NOTE — Patient Instructions (Signed)
Weight loss encouraged CPAP compliance with goal of at least 4-6 hrs every night is the expectation. Advised against medications with sedative side effects Recheck download in 1 month Decrease symbicort to once daily

## 2014-07-18 NOTE — Assessment & Plan Note (Signed)
Decrease symbicort to once daily Can dc next visit if breathing ok

## 2014-07-18 NOTE — Progress Notes (Signed)
   Subjective:    Patient ID: [name], female    DOB: Jan 19, 1966, 49 y.o.   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  She was evaluated in 06/2013 for asthma and chronic bronchitis She quit smoking in 1989, about 20 pack years. She reports exposure to secondhand smoke-her husband smoked about 3 packs per day.    07/18/2014  Chief Complaint  Patient presents with  . Sleep Apnea    CPAP doing well.  Missed 5 days with CPAP because she went out of town and forgot machine.  no complaints   He was started on auto CPAP last visit, since she was symptomatic She claims good compliance and feels that CPAP is benefiting Download 06/2014 >> usage only 2h, avg pr 10 cm, no residuals  Bedtime is around midnight, sleep latency is variable up to 2 hours, she sleeps on her back with 3 pillows, reports 3 nocturnal awakenings without any post was sleep latency, is out of bed by 4 AM to get her granddaughter ready for school, then dozes off again at 7 AM. She also reports afternoon naps Dyspnea is stable-denies wheezing or cough  Significant tests/ events PSG 02/2013 -wt - 245 pounds -total sleep time 349 minutes, AHI 6 per hour, lowest desat 82% PFTs 06/2013 -moderate restriction, ratio normal  Review of Systems neg for any significant sore throat, dysphagia, itching, sneezing, nasal congestion or excess/ purulent secretions, fever, chills, sweats, unintended wt loss, pleuritic or exertional cp, hempoptysis, orthopnea pnd or change in chronic leg swelling. Also denies presyncope, palpitations, heartburn, abdominal pain, nausea, vomiting, diarrhea or change in bowel or urinary habits, dysuria,hematuria, rash, arthralgias, visual complaints, headache, numbness weakness or ataxia.     Objective:   Physical Exam  Gen. Pleasant, obese, in no distress ENT - no  lesions, no post nasal drip Neck: No JVD, no thyromegaly, no carotid bruits Lungs: no use of accessory muscles, no dullness to percussion, decreased without rales or rhonchi  Cardiovascular: Rhythm regular, heart sounds  normal, no murmurs or gallops, no peripheral edema Musculoskeletal: No deformities, no cyanosis or clubbing , no tremors       Assessment & Plan:

## 2014-07-18 NOTE — Assessment & Plan Note (Addendum)
Weight loss encouraged CPAP compliance with goal of at least 4-6 hrs every night is the expectation. Advised against medications with sedative side effects Recheck download in 1 month and decide if CPAP trial is to continue

## 2014-07-19 ENCOUNTER — Encounter: Payer: Self-pay | Admitting: Physical Medicine & Rehabilitation

## 2014-07-26 ENCOUNTER — Encounter: Payer: Self-pay | Admitting: Pulmonary Disease

## 2014-08-08 ENCOUNTER — Other Ambulatory Visit: Payer: Self-pay | Admitting: Nurse Practitioner

## 2014-08-08 ENCOUNTER — Other Ambulatory Visit: Payer: Self-pay | Admitting: Internal Medicine

## 2014-08-09 ENCOUNTER — Encounter: Payer: Self-pay | Admitting: Physical Medicine & Rehabilitation

## 2014-08-27 ENCOUNTER — Other Ambulatory Visit: Payer: Medicare Other

## 2014-08-28 ENCOUNTER — Other Ambulatory Visit: Payer: Medicare Other

## 2014-09-03 ENCOUNTER — Other Ambulatory Visit: Payer: Self-pay | Admitting: Physical Medicine & Rehabilitation

## 2014-09-03 ENCOUNTER — Encounter: Payer: Self-pay | Admitting: Physical Medicine & Rehabilitation

## 2014-09-03 ENCOUNTER — Encounter: Payer: Medicare Other | Attending: Physical Medicine & Rehabilitation

## 2014-09-03 ENCOUNTER — Ambulatory Visit (HOSPITAL_BASED_OUTPATIENT_CLINIC_OR_DEPARTMENT_OTHER): Payer: Medicare Other | Admitting: Physical Medicine & Rehabilitation

## 2014-09-03 VITALS — BP 129/86 | HR 69

## 2014-09-03 DIAGNOSIS — I69354 Hemiplegia and hemiparesis following cerebral infarction affecting left non-dominant side: Secondary | ICD-10-CM | POA: Insufficient documentation

## 2014-09-03 DIAGNOSIS — F329 Major depressive disorder, single episode, unspecified: Secondary | ICD-10-CM | POA: Diagnosis not present

## 2014-09-03 DIAGNOSIS — Z79899 Other long term (current) drug therapy: Secondary | ICD-10-CM

## 2014-09-03 DIAGNOSIS — G8929 Other chronic pain: Secondary | ICD-10-CM | POA: Insufficient documentation

## 2014-09-03 DIAGNOSIS — Z5181 Encounter for therapeutic drug level monitoring: Secondary | ICD-10-CM

## 2014-09-03 DIAGNOSIS — I69359 Hemiplegia and hemiparesis following cerebral infarction affecting unspecified side: Secondary | ICD-10-CM | POA: Diagnosis not present

## 2014-09-03 DIAGNOSIS — E119 Type 2 diabetes mellitus without complications: Secondary | ICD-10-CM | POA: Diagnosis not present

## 2014-09-03 DIAGNOSIS — E1141 Type 2 diabetes mellitus with diabetic mononeuropathy: Secondary | ICD-10-CM

## 2014-09-03 DIAGNOSIS — M545 Low back pain: Secondary | ICD-10-CM | POA: Insufficient documentation

## 2014-09-03 NOTE — Addendum Note (Signed)
Addended by: Caro Hight on: 09/03/2014 12:44 PM   Modules accepted: Orders

## 2014-09-03 NOTE — Progress Notes (Signed)
Subjective:    Patient ID: [name], female    DOB: 08/18/1965, 49 y.o.   MRN: [id number]  HPI 50 year old female with chief complaint of low back pain Onset of pain was approximate 1 year ago. Patient states she has not had any imaging studies. She has been seen by primary care physician who referred her to pain management. She had breathalyzer testing revealing alcohol and therefore they did not prescribe any pain medications for her.  Reviewed opioid risk does have elevated risk of 10 on scoring today.This was admitted down from score of 13 initially  Low back pain and right buttocks pain. Does not travel down into the feet. Has a history of diabetes with peripheral neuropathy Also a past history of multiple strokes primarily affecting her left side.  Patient has a who assists her with bathing and dressing. Patient is on disability from prior strokes  Patient gets some relief from ibuprofen, also some relief from heating pad. Patient also has burning pain of the left foot. She gets some relief from Neurontin 600 mg 3 times per day for diabetic neuropathy diagnosis.  Lived in a assisted living facility for a time Pain Inventory Average Pain 10 Pain Right Now 10 My pain is constant, sharp, burning and dull  In the last 24 hours, has pain interfered with the following? General activity 10 Relation with others 10 Enjoyment of life 8 What TIME of day is your pain at its worst? Morning, Daytime, Evening and Night Sleep (in general) NA  Pain is worse with: walking, bending, standing and some activites Pain improves with: heat/ice Relief from Meds: 3  Mobility walk with assistance use a cane use a walker how many minutes can you walk? 4 do you drive?  no needs help with transfers  Function disabled: date disabled 2003  Neuro/Psych bladder control problems weakness trouble walking spasms dizziness  Prior Studies CT/MRI  Physicians involved in your  care Primary care Sherrie Mustache   Family History  Problem Relation Age of Onset  . Cancer Mother     uterine  . Hypertension Mother   . Cancer Maternal Grandmother     brain  . Hypertension Maternal Grandmother   . Anesthesia problems Neg Hx    Social History   Social History  . Marital Status: Married    Spouse Name: N/A  . Number of Children: 2  . Years of Education: N/A   Social History Main Topics  . Smoking status: Former Smoker -- 1.00 packs/day for 30 years    Types: Cigarettes    Quit date: 01/20/1987  . Smokeless tobacco: Never Used     Comment: quit more than 20years  . Alcohol Use: 0.0 oz/week    0 Standard drinks or equivalent per week     Comment: 1 glass weekly  . Drug Use: No     Comment: hx of marijuana use no longer uses   . Sexual Activity: Not Currently    Birth Control/ Protection: Surgical   Other Topics Concern  . None   Social History Narrative   Past Surgical History  Procedure Laterality Date  . Kidney stone removal    . Brain hematoma evacuation      five procedures total, first procedure when 40 months old, last at 49 years of age.  . Shunt removal      shunt inserted at age 49 removed at age 66   . Eye surgery    . Retinal detachment surgery  1990  . Vaginal hysterectomy  1996  . Incisional hernia repair  05/05/2011    Procedure: LAPAROSCOPIC INCISIONAL HERNIA;  Surgeon: Rolm Bookbinder, MD;  Location: [facility];  Service: General;  Laterality: N/A;  . Mass excision  05/05/2011    Procedure: EXCISION MASS;  Surgeon: Rolm Bookbinder, MD;  Location: [facility];  Service: General;  Laterality: Right;  . Hernia repair  05/05/11    lap incisional hernia  . Pcnl    . Cystoscopy/retrograde/ureteroscopy  08/24/2011    Procedure: CYSTOSCOPY/RETROGRADE/URETEROSCOPY;  Surgeon: Molli Hazard, MD;  Location: [facility];  Service: Urology;  Laterality: Right;  Cysto, Right retrograde Pyelogram, right stent placement.   . Cystoscopy with ureteroscopy   09/24/2011    Procedure: CYSTOSCOPY WITH URETEROSCOPY;  Surgeon: Malka So, MD;  Location: [facility];  Service: Urology;  Laterality: Right;  Balloon dilation right ureter   . Cystoscopy w/ retrogrades  09/24/2011    Procedure: CYSTOSCOPY WITH RETROGRADE PYELOGRAM;  Surgeon: Malka So, MD;  Location: [facility];  Service: Urology;  Laterality: Right;   Past Medical History  Diagnosis Date  . Hypertension   . Asthma   . Seizures   . Heart murmur     born with   . Dysrhythmia     skipped beats  . Myocardial infarction     hi of x 2  last one 2003   . COPD (chronic obstructive pulmonary disease)     chronic bronchitis   . Pneumonia     hx of 2009  . GERD (gastroesophageal reflux disease)   . Headache(784.0)     migraines   . Neuropathy     due to diabetes   . Dizziness     secondary to diabetes and hypertension   . Arthritis     joint pain   . Hx of blood clots     hematomas removed from left side of brain from 55mo to 89yro[age]   . Epilepsy idiopathic petit mal     last seizure 2012;controlled w/ topomax  . Diabetic neuropathy, painful     FEET AND HANDS  . Diabetes mellitus     since age [age]type 2 IDDM  . Sleep apnea     [facility];does not use Cpap ; mild  . MIGRAINE HEADACHE 09/14/2006    Qualifier: Diagnosis of  By: MaHassell DoneNP, NyTori Milks  . Glaucoma     NOT ON ANY EYE DROPS   . Legally blind     left eye   . Stroke     last 2003  RESIDUAL LEFT LEG WEAKNESS--NO OTHER RESIDUAL PROBLEMS  . CVA 11/14/2007    Qualifier: Diagnosis of  By: MaHassell DoneNP, NyTori Milks  . Pain 09/23/11    LOWER BACK AND RIGHT SIDE--PT HAS RIGHT URETERAL STONE  . Nephrolithiasis     frequent urination , urination at nite  PT SEEN IN ER 09/22/11 FOR BACK AND RT SIDED PAIN--HAS KNOWN STONE RT URETER AND UA IN ER SHOWED UTI  . Anxiety   . Depression   . Stress incontinence   . Hyperlipidemia   . Pyelonephritis    BP 129/86 mmHg  Pulse 69  SpO2 100%  Opioid Risk Score:    Fall Risk Score:  `1  Depression screen PHQ 2/9  [facility] 09/03/2014 12/25/2013 06/07/2013  Decreased Interest 2 0 0  Down, Depressed, Hopeless 3 0 1  PHQ - 2 Score 5 0  1  Altered sleeping 2 - -  Tired, decreased energy 2 - -  Change in appetite 3 - -  Feeling bad or failure about yourself  2 - -  Trouble concentrating 2 - -  Moving slowly or fidgety/restless 3 - -  Suicidal thoughts 0 - -  PHQ-9 Score 19 - -     Review of Systems  Constitutional: Positive for diaphoresis and unexpected weight change.  Respiratory: Positive for apnea, cough, shortness of breath and wheezing.   Gastrointestinal: Positive for abdominal pain.  Endocrine:       High Blood Sugar Problems  Genitourinary:       Bladder Control Problems  Musculoskeletal: Positive for gait problem.  Neurological: Positive for dizziness and weakness.       Spasms       Objective:   Physical Exam  Constitutional: She is oriented to person, place, and time. She appears well-developed.  obese  HENT:  Head: Normocephalic and atraumatic.  Eyes: Conjunctivae and EOM are normal. Pupils are equal, round, and reactive to light.  Neck: Normal range of motion. Neck supple.  Cardiovascular: Normal rate, regular rhythm, normal heart sounds and intact distal pulses.   Pulmonary/Chest: Effort normal and breath sounds normal.  Abdominal: Soft. Bowel sounds are normal.  Musculoskeletal:       Cervical back: She exhibits decreased range of motion. She exhibits no tenderness and no deformity.       Thoracic back: She exhibits decreased range of motion. She exhibits no tenderness.       Lumbar back: She exhibits decreased range of motion and tenderness. She exhibits no deformity and no spasm.  Neurological: She is alert and oriented to person, place, and time. A sensory deficit is present.  Reflex Scores:      Tricep reflexes are 2+ on the right side and 2+ on the left side.      Bicep reflexes are 2+ on the  right side and 2+ on the left side.      Brachioradialis reflexes are 2+ on the right side and 2+ on the left side.      Patellar reflexes are 2+ on the right side and 2+ on the left side.      Achilles reflexes are 0 on the right side and 0 on the left side. Motor strength in lower extremities 4/5 in the right hip flexor and extensor and ankle dorsiflexor and plantar flexor 3 minus at the left hip flexor and extensor ankle dorsiflexor and plantar flexor  Psychiatric: Her affect is labile. Her speech is delayed. She is slowed. Cognition and memory are impaired. She exhibits a depressed mood.  Nursing note and vitals reviewed.   Lumbar flexion 25%, extension 25% lateral bending 25% to the right and to the left, twisting 25% to the right and 75% to the left Range of motion is normal in both upper extremities at the shoulders wrists elbows fingers. Motor strength is 5/5 in the right deltoid, biceps, triceps, grip 4/5 in left bicep, tricep, deltoid and grip Sensation to light touch is intact in bilateral upper limbs Left lower extremity has reduced sensation to pinprick at the left L5 left S1 dermatomal distribution.     Assessment & Plan:  1. Chronic low back pain. There is been no diagnostic workup for this yet. Recommend starting out with an x-ray  Will also prescribe outpatient physical therapy  If not much better in a month consider MRI   Given her history of  primarily right-sided low back pain as well as prior history of CVA with residual left hemiparesis I suspect she may have some sacroiliac dysfunction. Patient does have left lower extremity numbness but has attributed this to diabetic neuropathy. Certainly it is possible that this could really present radiculopathy instead.  She states she's had some type of nerve testing the past and claims that she was burned from it.  We discussed her opioid risk as being elevated and therefore would not be using narcotic analgesics,  furthermore she has a history of CVA and has an elevated fall risk  2. Depression, She will follow up with psychiatry  3. Diabetes with probable neuropathy. Do not see record of EMG testing, may have had neuro-metric testing at pain clinic but this would not be the same as a standard EMG. Consider EMG testing left lower extremity.

## 2014-09-03 NOTE — Patient Instructions (Signed)
We will not be using medicines such as Percocet or other narcotic analgesics given your prior history.. Our focus will be on nonnarcotic management as well as trying to find out the cause of your low back pain.

## 2014-09-04 ENCOUNTER — Encounter: Payer: Self-pay | Admitting: Nurse Practitioner

## 2014-09-04 ENCOUNTER — Ambulatory Visit (INDEPENDENT_AMBULATORY_CARE_PROVIDER_SITE_OTHER): Payer: Medicare Other | Admitting: Nurse Practitioner

## 2014-09-04 VITALS — BP 110/86 | HR 80 | Temp 98.1°F | Resp 18 | Ht 66.0 in | Wt 223.2 lb

## 2014-09-04 DIAGNOSIS — E1129 Type 2 diabetes mellitus with other diabetic kidney complication: Secondary | ICD-10-CM

## 2014-09-04 DIAGNOSIS — I10 Essential (primary) hypertension: Secondary | ICD-10-CM

## 2014-09-04 DIAGNOSIS — I252 Old myocardial infarction: Secondary | ICD-10-CM | POA: Diagnosis not present

## 2014-09-04 DIAGNOSIS — E1165 Type 2 diabetes mellitus with hyperglycemia: Secondary | ICD-10-CM | POA: Diagnosis not present

## 2014-09-04 DIAGNOSIS — G4733 Obstructive sleep apnea (adult) (pediatric): Secondary | ICD-10-CM

## 2014-09-04 DIAGNOSIS — K219 Gastro-esophageal reflux disease without esophagitis: Secondary | ICD-10-CM

## 2014-09-04 DIAGNOSIS — J984 Other disorders of lung: Secondary | ICD-10-CM

## 2014-09-04 DIAGNOSIS — IMO0002 Reserved for concepts with insufficient information to code with codable children: Secondary | ICD-10-CM

## 2014-09-04 DIAGNOSIS — E559 Vitamin D deficiency, unspecified: Secondary | ICD-10-CM | POA: Diagnosis not present

## 2014-09-04 LAB — PMP ALCOHOL METABOLITE (ETG): Ethyl Glucuronide (EtG): NEGATIVE ng/mL

## 2014-09-04 NOTE — Patient Instructions (Signed)
Cont to work on diet and exercise.   Will be in touch with you when your lab work comes back  Follow up in 3 months

## 2014-09-04 NOTE — Progress Notes (Signed)
Patient ID: [name], female   DOB: May 06, 1965, 49 y.o.   MRN: [id number]    PCP: Lauree Chandler, NP  Allergies  Allergen Reactions  . Codeine Swelling and Other (See Comments)    Swelling and burning of mouth (inside)  . Lactose Intolerance (Gi) Nausea And Vomiting    Chief Complaint  Patient presents with  . Medical Management of Chronic Issues    3 month follow-up     HPI: Patient is a 49 y.o. female seen in the office today for routine follow up on chronic conditions. Pt with a pmh of back pain, OSA using CPAP, COPD, CAD, urinary incontinence, vit d def, diabetes.  Went and met new pain doctor yesterday. Plans to get xray of back. Pt having a lot of trouble walking. Could not get out of bed. Does not have pain every day. Some days are worse than others.   Did not make it to get blood work done prior to visit Blood sugars fasting 120s-170s Taking levemir and meal coverage.   Taking vit d weekly-- has been taking since last appt.   Denies depression or feeling down. Hx of feeling down, depressed and hopeless but reports things are better now. Following with psych  Changed eating habits, GERD has improved  Walking twice daily for exercise, pain doctor is going to get her back into PT.   Incontinence    Needs scat form filled out.   Review of Systems:  Review of Systems  Constitutional: Negative for activity change, appetite change, fatigue and unexpected weight change.  Respiratory: Negative for cough and shortness of breath.        Hx of sleep apnea has CPAP  Cardiovascular: Negative for chest pain, palpitations and leg swelling.  Gastrointestinal: Negative for nausea, abdominal pain, diarrhea, constipation and abdominal distention.  Genitourinary: Negative for dysuria, urgency, frequency and flank pain.  Musculoskeletal: Positive for myalgias and back pain.       Chronic pain  Skin: Negative for rash.  Neurological: Negative for dizziness, seizures (no  seizures since 2014), weakness and headaches.  Psychiatric/Behavioral: Negative for suicidal ideas and hallucinations.    Past Medical History  Diagnosis Date  . Hypertension   . Asthma   . Seizures   . Heart murmur     born with   . Dysrhythmia     skipped beats  . Myocardial infarction     hi of x 2  last one 2003   . COPD (chronic obstructive pulmonary disease)     chronic bronchitis   . Pneumonia     hx of 2009  . GERD (gastroesophageal reflux disease)   . Headache(784.0)     migraines   . Neuropathy     due to diabetes   . Dizziness     secondary to diabetes and hypertension   . Arthritis     joint pain   . Hx of blood clots     hematomas removed from left side of brain from 36mo to 89yrold   . Epilepsy idiopathic petit mal     last seizure 2012;controlled w/ topomax  . Diabetic neuropathy, painful     FEET AND HANDS  . Diabetes mellitus     since age [age]type 2 IDDM  . Sleep apnea     sleep study 2010 @ [facility];does not use Cpap ; mild  . MIGRAINE HEADACHE 09/14/2006    Qualifier: Diagnosis of  By: MaHassell DoneNP, NyTori Milks  .  Glaucoma     NOT ON ANY EYE DROPS   . Legally blind     left eye   . Stroke     last 2003  RESIDUAL LEFT LEG WEAKNESS--NO OTHER RESIDUAL PROBLEMS  . CVA 11/14/2007    Qualifier: Diagnosis of  By: Hassell Done [name], Tori Milks    . Pain 09/23/11    LOWER BACK AND RIGHT SIDE--PT HAS RIGHT URETERAL STONE  . Nephrolithiasis     frequent urination , urination at nite  PT SEEN IN ER 09/22/11 FOR BACK AND RT SIDED PAIN--HAS KNOWN STONE RT URETER AND UA IN ER SHOWED UTI  . Anxiety   . Depression   . Stress incontinence   . Hyperlipidemia   . Pyelonephritis    Past Surgical History  Procedure Laterality Date  . Kidney stone removal    . Brain hematoma evacuation      five procedures total, first procedure when 57 months old, last at 49 years of age.  . Shunt removal      shunt inserted at age 67 removed at age 26   . Eye surgery    . Retinal  detachment surgery  1990  . Vaginal hysterectomy  1996  . Incisional hernia repair  05/05/2011    Procedure: LAPAROSCOPIC INCISIONAL HERNIA;  Surgeon: Rolm Bookbinder, MD;  Location: [facility];  Service: General;  Laterality: N/A;  . Mass excision  05/05/2011    Procedure: EXCISION MASS;  Surgeon: Rolm Bookbinder, MD;  Location: [facility];  Service: General;  Laterality: Right;  . Hernia repair  05/05/11    lap incisional hernia  . Pcnl    . Cystoscopy/retrograde/ureteroscopy  08/24/2011    Procedure: CYSTOSCOPY/RETROGRADE/URETEROSCOPY;  Surgeon: Molli Hazard, MD;  Location: [facility];  Service: Urology;  Laterality: Right;  Cysto, Right retrograde Pyelogram, right stent placement.   . Cystoscopy with ureteroscopy  09/24/2011    Procedure: CYSTOSCOPY WITH URETEROSCOPY;  Surgeon: Malka So, MD;  Location: [facility];  Service: Urology;  Laterality: Right;  Balloon dilation right ureter   . Cystoscopy w/ retrogrades  09/24/2011    Procedure: CYSTOSCOPY WITH RETROGRADE PYELOGRAM;  Surgeon: Malka So, MD;  Location: [facility];  Service: Urology;  Laterality: Right;   Social History:   reports that she quit smoking about 27 years ago. Her smoking use included Cigarettes. She has a 30 pack-year smoking history. She has never used smokeless tobacco. She reports that she drinks alcohol. She reports that she does not use illicit drugs.  Family History  Problem Relation Age of Onset  . Cancer Mother     uterine  . Hypertension Mother   . Cancer Maternal Grandmother     brain  . Hypertension Maternal Grandmother   . Anesthesia problems Neg Hx     Medications: Patient's Medications  New Prescriptions   No medications on file  Previous Medications   BACLOFEN (LIORESAL) 10 MG TABLET    TAKE 1 TABLET BY MOUTH THREE TIMES A DAY AS NEEDED FOR MUSCLE SPASMS   CLOPIDOGREL (PLAVIX) 75 MG TABLET    TAKE 1 TABLET BY MOUTH EVERY DAY   EASY TOUCH PEN NEEDLES 31G X 8 MM MISC    USE AS DIRECTED TO INJECT INSULIN  DAILY   FLUOXETINE (PROZAC) 20 MG CAPSULE    Take 3 capsules (60 mg total) by mouth daily at 12 noon.   FLUTICASONE (FLONASE) 50 MCG/ACT NASAL SPRAY    Place 2 sprays into both nostrils daily.  GABAPENTIN (NEURONTIN) 300 MG CAPSULE    TAKE 2 CAPSULES BY MOUTH THREE TIMES A DAY   IBUPROFEN (ADVIL,MOTRIN) 600 MG TABLET    TAKE 1 TABLET BY MOUTH EVERY 8 HOURS AS NEEDED FOR PAIN   INSULIN GLARGINE (TOUJEO SOLOSTAR) 300 UNIT/ML SOPN    Inject 93 Units into the skin daily.   INSULIN LISPRO, HUMAN, (HUMALOG KWIKPEN) 200 UNIT/ML SOPN    Inject 5 Units into the skin 3 (three) times daily with meals.   LISINOPRIL (PRINIVIL,ZESTRIL) 40 MG TABLET    Take 1 tablet (40 mg total) by mouth daily.   MYRBETRIQ 25 MG TB24 TABLET    TAKE 1 TABLET BY MOUTH ONCE DAILY FOR BLADDER   ONDANSETRON (ZOFRAN ODT) 4 MG DISINTEGRATING TABLET    Take 1 tablet (4 mg total) by mouth every 8 (eight) hours as needed for nausea or vomiting.   ONE TOUCH ULTRA TEST TEST STRIP    USE TO CHECK BLOOD SUGAR FOUR TIMES A DAY   ONETOUCH DELICA LANCETS 47M MISC    TEST BLOOD SUGAR FOUR TIMES A DAY   OXYCODONE-ACETAMINOPHEN (PERCOCET/ROXICET) 5-325 MG PER TABLET    Take 1-2 tablets by mouth every 6 (six) hours as needed for severe pain.   PANTOPRAZOLE (PROTONIX) 40 MG TABLET    TAKE 1 TABLET BY MOUTH DAILY   POLYETHYLENE GLYCOL (MIRALAX) PACKET    Take 17 g by mouth daily.   POTASSIUM CHLORIDE SA (K-DUR,KLOR-CON) 20 MEQ TABLET    Take 2 tablets (40 mEq total) by mouth daily.   PROAIR HFA 108 (90 BASE) MCG/ACT INHALER    INHALE 2 PUFFS BY MOUTH EVERY 6 HOURS AS NEEDED FOR WHEEZING OR SHORTNESS OF BREATH   RANITIDINE (ZANTAC) 150 MG TABLET    TAKE 1 TABLET BY MOUTH TWICE DAILY   SIMVASTATIN (ZOCOR) 20 MG TABLET    TAKE 1 TABLET BY MOUTH EVERY MORNING FOR CHOLESTROL   SYMBICORT 160-4.5 MCG/ACT INHALER    INHALE 2 PUFFS BY MOUTH TWICE DAILY   TOPIRAMATE (TOPAMAX) 200 MG TABLET    TAKE 1 TABLET BY MOUTH TWICE DAILY   TRAZODONE (DESYREL) 150 MG  TABLET    Take 150 mg by mouth 2 (two) times daily.    VERAPAMIL (VERELAN PM) 240 MG 24 HR CAPSULE    TAKE 1 CAPSULE BY MOUTH EVERY NIGHT AT BEDTIME FOR BLOOD PRESSURE   VITAMIN D, ERGOCALCIFEROL, (DRISDOL) 50000 UNITS CAPS CAPSULE    TAKE 1 CAPSULE BY MOUTH EVERY THURSDAY  Modified Medications   No medications on file  Discontinued Medications   No medications on file     Physical Exam:  Filed Vitals:   09/04/14 0920  BP: 110/86  Pulse: 80  Temp: 98.1 F (36.7 C)  TempSrc: Oral  Resp: 18  Height: 5' 6"  (1.676 m)  Weight: 223 lb 3.2 oz (101.243 kg)  SpO2: 98%    Physical Exam  Constitutional: She is oriented to person, place, and time. She appears well-developed and well-nourished. No distress.  HENT:  Head: Normocephalic and atraumatic.  Mouth/Throat: Oropharynx is clear and moist. No oropharyngeal exudate.  Eyes: Pupils are equal, round, and reactive to light.  Neck: Normal range of motion. Neck supple.  Cardiovascular: Normal rate, regular rhythm and normal heart sounds.   Pulmonary/Chest: Effort normal and breath sounds normal.  Abdominal: Soft. Bowel sounds are normal. She exhibits no distension. There is no tenderness.  Musculoskeletal: Normal range of motion. She exhibits tenderness (lumbar spine). She exhibits no edema.  Neurological: She is alert and oriented to person, place, and time.  Skin: Skin is warm and dry. She is not diaphoretic.  Psychiatric: She has a normal mood and affect.    Labs reviewed: Basic Metabolic Panel:  Recent Labs  03/15/14 1524 06/18/14 0001 06/21/14 1535  NA 139 140 142  K 4.4 3.0* 4.0  CL 103 109 105  CO2 21 23 23   GLUCOSE 167* 188* 193*  BUN 17 17 16   CREATININE 0.89 0.98 1.09*  CALCIUM 10.0 9.1 9.1   Liver Function Tests:  Recent Labs  02/22/14 1000 02/23/14 0530 06/18/14 0001  AST 12 14 11*  ALT 12 13 12*  ALKPHOS 76 69 74  BILITOT 0.4 0.4 <0.1*  PROT 7.0 6.4 7.3  ALBUMIN 3.2* 3.0* 3.5    Recent Labs   06/18/14 0001  LIPASE 22   No results for input(s): AMMONIA in the last 8760 hours. CBC:  Recent Labs  02/22/14 1723  02/27/14 1026 03/15/14 1524 06/18/14 0001  WBC 6.7  < > 7.3 4.8 5.7  NEUTROABS 4.7  --   --  2.4 5.2  HGB 12.0  < > 12.1 13.0 11.3*  HCT 37.0  < > 37.1 39.7 36.1  MCV 92.5  < > 91.8 91 91.9  PLT 212  < > 286 285 202  < > = values in this interval not displayed. Lipid Panel:  Recent Labs  09/07/13 0941 12/25/13 0911  CHOL 159 161  HDL 43 38*  LDLCALC 98 97  TRIG 90 131  CHOLHDL 3.7 4.2   TSH: No results for input(s): TSH in the last 8760 hours. A1C: Lab Results  Component Value Date   HGBA1C 8.8* 05/24/2014     Assessment/Plan 1. Essential hypertension Blood pressure controlled on current regimen   2. OSA (obstructive sleep apnea) Improved sleep and fatigue, tolerating machine, cont with CPAP nightly   3. Restrictive airway disease Stable,  conts on Symbicort, has follow up with pulmonary   4. Gastroesophageal reflux disease without esophagitis -stable on protonix on zantac with diet modifications   5. Type 2 diabetes, uncontrolled, with renal manifestation -does not have blood sugars today. Reports better control -conts on levemir and meal coverage -conts on ACE -cont diet modifications - Hemoglobin A1c - Comprehensive metabolic panel  6. MYOCARDIAL INFARCTION, HX OF -no chest pains, conts on plavix  - CBC with Differential/Platelet  7. Vitamin D deficiency -reports compliance with vit D weekly -conts on vit d 50000 units - Vitamin D, 25-hydroxy  8. Anxiety and depression -pt reports improvement in mood, conts Prozac 60 mg daily   Follow up in 3 months, lab work obtained today, will call with results and further [place]. Harle Battiest  [facility] [phone] 8 am - 5 pm) [phone] (after hours)

## 2014-09-05 ENCOUNTER — Other Ambulatory Visit: Payer: Self-pay | Admitting: *Deleted

## 2014-09-05 DIAGNOSIS — E559 Vitamin D deficiency, unspecified: Secondary | ICD-10-CM

## 2014-09-05 LAB — CBC WITH DIFFERENTIAL/PLATELET
Basophils Absolute: 0 10*3/uL (ref 0.0–0.2)
Basos: 0 %
EOS (ABSOLUTE): 0 10*3/uL (ref 0.0–0.4)
Eos: 0 %
Hematocrit: 36.5 % (ref 34.0–46.6)
Hemoglobin: 12.2 g/dL (ref 11.1–15.9)
Immature Grans (Abs): 0 10*3/uL (ref 0.0–0.1)
Immature Granulocytes: 0 %
Lymphocytes Absolute: 1.4 10*3/uL (ref 0.7–3.1)
Lymphs: 26 %
MCH: 29.5 pg (ref 26.6–33.0)
MCHC: 33.4 g/dL (ref 31.5–35.7)
MCV: 88 fL (ref 79–97)
Monocytes Absolute: 0.3 10*3/uL (ref 0.1–0.9)
Monocytes: 6 %
Neutrophils Absolute: 3.8 10*3/uL (ref 1.4–7.0)
Neutrophils: 68 %
Platelets: 276 10*3/uL (ref 150–379)
RBC: 4.14 x10E6/uL (ref 3.77–5.28)
RDW: 14.1 % (ref 12.3–15.4)
WBC: 5.6 10*3/uL (ref 3.4–10.8)

## 2014-09-05 LAB — COMPREHENSIVE METABOLIC PANEL
ALT: 7 IU/L (ref 0–32)
AST: 9 IU/L (ref 0–40)
Albumin/Globulin Ratio: 1.2 (ref 1.1–2.5)
Albumin: 3.9 g/dL (ref 3.5–5.5)
Alkaline Phosphatase: 66 IU/L (ref 39–117)
BUN/Creatinine Ratio: 12 (ref 9–23)
BUN: 13 mg/dL (ref 6–24)
Bilirubin Total: <0.2 mg/dL (ref 0.0–1.2)
CO2: 22 mmol/L (ref 18–29)
Calcium: 9.1 mg/dL (ref 8.7–10.2)
Chloride: 104 mmol/L (ref 97–108)
Creatinine, Ser: 1.07 mg/dL — ABNORMAL HIGH (ref 0.57–1.00)
GFR calc Af Amer: 70 mL/min/{1.73_m2} (ref >59)
GFR calc non Af Amer: 61 mL/min/{1.73_m2} (ref >59)
Globulin, Total: 3.2 g/dL (ref 1.5–4.5)
Glucose: 120 mg/dL — ABNORMAL HIGH (ref 65–99)
Potassium: 3.9 mmol/L (ref 3.5–5.2)
Sodium: 142 mmol/L (ref 134–144)
Total Protein: 7.1 g/dL (ref 6.0–8.5)

## 2014-09-05 LAB — HEMOGLOBIN A1C
Est. average glucose Bld gHb Est-mCnc: 163 mg/dL
Hgb A1c MFr Bld: 7.3 % — ABNORMAL HIGH (ref 4.8–5.6)

## 2014-09-05 MED ORDER — INSULIN GLARGINE 300 UNIT/ML ~~LOC~~ SOPN: 96.0000 [IU] | PEN_INJECTOR | Freq: Every day | SUBCUTANEOUS | Status: DC

## 2014-09-05 NOTE — Telephone Encounter (Signed)
Correction made to Rx due to labwork changes.

## 2014-09-06 LAB — SPECIMEN STATUS REPORT

## 2014-09-06 LAB — FENTANYL (GC/LC/MS), URINE
Fentanyl, confirm: NEGATIVE ng/mL (ref <0.5)
Norfentanyl, confirm: NEGATIVE ng/mL (ref <0.5)

## 2014-09-06 LAB — VITAMIN D 25 HYDROXY (VIT D DEFICIENCY, FRACTURES): Vit D, 25-Hydroxy: 45.7 ng/mL (ref 30.0–100.0)

## 2014-09-07 LAB — PRESCRIPTION MONITORING PROFILE (SOLSTAS)
Amphetamine/Meth: NEGATIVE ng/mL
Barbiturate Screen, Urine: NEGATIVE ng/mL
Benzodiazepine Screen, Urine: NEGATIVE ng/mL
Buprenorphine, Urine: NEGATIVE ng/mL
Cannabinoid Scrn, Ur: NEGATIVE ng/mL
Carisoprodol, Urine: NEGATIVE ng/mL
Cocaine Metabolites: NEGATIVE ng/mL
Creatinine, Urine: 94.64 mg/dL (ref >20.0)
MDMA URINE: NEGATIVE ng/mL
Meperidine, Ur: NEGATIVE ng/mL
Methadone Screen, Urine: NEGATIVE ng/mL
Nitrites, Initial: NEGATIVE ug/mL
Opiate Screen, Urine: NEGATIVE ng/mL
Oxycodone Screen, Ur: NEGATIVE ng/mL
Propoxyphene: NEGATIVE ng/mL
Tapentadol, urine: NEGATIVE ng/mL
Tramadol Scrn, Ur: NEGATIVE ng/mL
Zolpidem, Urine: NEGATIVE ng/mL
pH, Initial: 6.2 pH (ref 4.5–8.9)

## 2014-09-10 ENCOUNTER — Ambulatory Visit (HOSPITAL_COMMUNITY)
Admission: RE | Admit: 2014-09-10 | Discharge: 2014-09-10 | Disposition: A | Discharge status: Home / Self Care | Payer: Medicare Other | Source: Ambulatory Visit | Attending: Physical Medicine & Rehabilitation | Admitting: Physical Medicine & Rehabilitation

## 2014-09-10 DIAGNOSIS — M8938 Hypertrophy of bone, other site: Secondary | ICD-10-CM | POA: Insufficient documentation

## 2014-09-10 DIAGNOSIS — G8929 Other chronic pain: Secondary | ICD-10-CM

## 2014-09-10 DIAGNOSIS — M545 Low back pain: Secondary | ICD-10-CM | POA: Diagnosis present

## 2014-09-10 DIAGNOSIS — M5136 Other intervertebral disc degeneration, lumbar region: Secondary | ICD-10-CM | POA: Diagnosis not present

## 2014-09-13 ENCOUNTER — Other Ambulatory Visit: Payer: Self-pay | Admitting: Internal Medicine

## 2014-09-13 ENCOUNTER — Ambulatory Visit: Payer: Medicare Other | Admitting: Nurse Practitioner

## 2014-09-14 NOTE — Progress Notes (Signed)
Urine drug screen for this encounter is consistent for prescribed medication 

## 2014-10-01 ENCOUNTER — Encounter: Payer: Medicare Other | Attending: Physical Medicine & Rehabilitation

## 2014-10-01 ENCOUNTER — Ambulatory Visit: Payer: Medicare Other | Admitting: Physical Medicine & Rehabilitation

## 2014-10-01 DIAGNOSIS — M545 Low back pain: Secondary | ICD-10-CM | POA: Insufficient documentation

## 2014-10-01 DIAGNOSIS — F329 Major depressive disorder, single episode, unspecified: Secondary | ICD-10-CM | POA: Insufficient documentation

## 2014-10-01 DIAGNOSIS — E119 Type 2 diabetes mellitus without complications: Secondary | ICD-10-CM | POA: Insufficient documentation

## 2014-10-01 DIAGNOSIS — G8929 Other chronic pain: Secondary | ICD-10-CM | POA: Insufficient documentation

## 2014-10-01 DIAGNOSIS — I69354 Hemiplegia and hemiparesis following cerebral infarction affecting left non-dominant side: Secondary | ICD-10-CM | POA: Insufficient documentation

## 2014-10-05 ENCOUNTER — Other Ambulatory Visit: Payer: Self-pay | Admitting: Nurse Practitioner

## 2014-10-09 ENCOUNTER — Ambulatory Visit (HOSPITAL_BASED_OUTPATIENT_CLINIC_OR_DEPARTMENT_OTHER): Payer: Medicare Other | Admitting: Physical Medicine & Rehabilitation

## 2014-10-09 ENCOUNTER — Encounter: Payer: Self-pay | Admitting: Physical Medicine & Rehabilitation

## 2014-10-09 VITALS — BP 163/93 | HR 118

## 2014-10-09 DIAGNOSIS — M533 Sacrococcygeal disorders, not elsewhere classified: Secondary | ICD-10-CM | POA: Diagnosis not present

## 2014-10-09 DIAGNOSIS — G8194 Hemiplegia, unspecified affecting left nondominant side: Secondary | ICD-10-CM

## 2014-10-09 DIAGNOSIS — G8190 Hemiplegia, unspecified affecting unspecified side: Secondary | ICD-10-CM

## 2014-10-09 DIAGNOSIS — E119 Type 2 diabetes mellitus without complications: Secondary | ICD-10-CM | POA: Diagnosis not present

## 2014-10-09 DIAGNOSIS — M545 Low back pain, unspecified: Secondary | ICD-10-CM

## 2014-10-09 DIAGNOSIS — G8929 Other chronic pain: Secondary | ICD-10-CM

## 2014-10-09 DIAGNOSIS — F329 Major depressive disorder, single episode, unspecified: Secondary | ICD-10-CM | POA: Diagnosis not present

## 2014-10-09 DIAGNOSIS — I69354 Hemiplegia and hemiparesis following cerebral infarction affecting left non-dominant side: Secondary | ICD-10-CM | POA: Diagnosis not present

## 2014-10-09 NOTE — Patient Instructions (Signed)
Physical therapy and Sacroiliac injection

## 2014-10-09 NOTE — Progress Notes (Signed)
Subjective:    Patient ID: [name], female    DOB: Oct 16, 1965, 49 y.o.   MRN: [id number]  HPI Here to review x-rays and discuss further treatment of her low back pain. We reviewed x-rays showing some mild spondylosis L5-S1 as well as minimal decreased disc space at L4-5. SI joint on the left has minimal spurring.  Pain Inventory Average Pain 10 Pain Right Now 10 My pain is sharp, burning and aching  In the last 24 hours, has pain interfered with the following? General activity 4 Relation with others 5 Enjoyment of life 5 What TIME of day is your pain at its worst? morning, night Sleep (in general) Poor  Pain is worse with: walking Pain improves with: heat/ice Relief from Meds: 2  Mobility use a cane ability to climb steps?  no do you drive?  no needs help with transfers Do you have any goals in this area?  no  Function disabled: date disabled na retired I need assistance with the following:  dressing, household duties and shopping Do you have any goals in this area?  no  Neuro/Psych bladder control problems weakness trouble walking spasms dizziness  Prior Studies x-rays  Physicians involved in your care na   Family History  Problem Relation Age of Onset  . Cancer Mother     uterine  . Hypertension Mother   . Cancer Maternal Grandmother     brain  . Hypertension Maternal Grandmother   . Anesthesia problems Neg Hx    Social History   Social History  . Marital Status: Married    Spouse Name: N/A  . Number of Children: 2  . Years of Education: N/A   Social History Main Topics  . Smoking status: Former Smoker -- 1.00 packs/day for 30 years    Types: Cigarettes    Quit date: 01/20/1987  . Smokeless tobacco: Never Used     Comment: quit more than 20years  . Alcohol Use: 0.0 oz/week    0 Standard drinks or equivalent per week     Comment: 1 glass weekly  . Drug Use: No     Comment: hx of marijuana use no longer uses   . Sexual  Activity: Not Currently    Birth Control/ Protection: Surgical   Other Topics Concern  . None   Social History Narrative   Past Surgical History  Procedure Laterality Date  . Kidney stone removal    . Brain hematoma evacuation      five procedures total, first procedure when 40 months old, last at 49 years of age.  . Shunt removal      shunt inserted at age 1 removed at age 60   . Eye surgery    . Retinal detachment surgery  1990  . Vaginal hysterectomy  1996  . Incisional hernia repair  05/05/2011    Procedure: LAPAROSCOPIC INCISIONAL HERNIA;  Surgeon: Rolm Bookbinder, MD;  Location: [facility];  Service: General;  Laterality: N/A;  . Mass excision  05/05/2011    Procedure: EXCISION MASS;  Surgeon: Rolm Bookbinder, MD;  Location: [facility];  Service: General;  Laterality: Right;  . Hernia repair  05/05/11    lap incisional hernia  . Pcnl    . Cystoscopy/retrograde/ureteroscopy  08/24/2011    Procedure: CYSTOSCOPY/RETROGRADE/URETEROSCOPY;  Surgeon: Molli Hazard, MD;  Location: [facility];  Service: Urology;  Laterality: Right;  Cysto, Right retrograde Pyelogram, right stent placement.   . Cystoscopy with ureteroscopy  09/24/2011  Procedure: CYSTOSCOPY WITH URETEROSCOPY;  Surgeon: Malka So, MD;  Location: [facility];  Service: Urology;  Laterality: Right;  Balloon dilation right ureter   . Cystoscopy w/ retrogrades  09/24/2011    Procedure: CYSTOSCOPY WITH RETROGRADE PYELOGRAM;  Surgeon: Malka So, MD;  Location: [facility];  Service: Urology;  Laterality: Right;   Past Medical History  Diagnosis Date  . Hypertension   . Asthma   . Seizures   . Heart murmur     born with   . Dysrhythmia     skipped beats  . Myocardial infarction     hi of x 2  last one 2003   . COPD (chronic obstructive pulmonary disease)     chronic bronchitis   . Pneumonia     hx of 2009  . GERD (gastroesophageal reflux disease)   . Headache(784.0)     migraines   . Neuropathy     due to diabetes   .  Dizziness     secondary to diabetes and hypertension   . Arthritis     joint pain   . Hx of blood clots     hematomas removed from left side of brain from 3mo to 89yrold   . Epilepsy idiopathic petit mal     last seizure 2012;controlled w/ topomax  . Diabetic neuropathy, painful     FEET AND HANDS  . Diabetes mellitus     since age [age]type 2 IDDM  . Sleep apnea     sleep study 2010 @ [facility];does not use Cpap ; mild  . MIGRAINE HEADACHE 09/14/2006    Qualifier: Diagnosis of  By: MaHassell DoneNP, NyTori Milks  . Glaucoma     NOT ON ANY EYE DROPS   . Legally blind     left eye   . Stroke     last 2003  RESIDUAL LEFT LEG WEAKNESS--NO OTHER RESIDUAL PROBLEMS  . CVA 11/14/2007    Qualifier: Diagnosis of  By: MaHassell DoneNP, NyTori Milks  . Pain 09/23/11    LOWER BACK AND RIGHT SIDE--PT HAS RIGHT URETERAL STONE  . Nephrolithiasis     frequent urination , urination at nite  PT SEEN IN ER 09/22/11 FOR BACK AND RT SIDED PAIN--HAS KNOWN STONE RT URETER AND UA IN ER SHOWED UTI  . Anxiety   . Depression   . Stress incontinence   . Hyperlipidemia   . Pyelonephritis    BP 163/93 mmHg  Pulse 118  SpO2 100%  Opioid Risk Score:   Fall Risk Score:  `1  Depression screen PHQ 2/9  Depression screen [facility]/9 10/09/2014 09/03/2014 12/25/2013 06/07/2013  Decreased Interest 0 2 0 0  Down, Depressed, Hopeless 0 3 0 1  PHQ - 2 Score 0 5 0 1  Altered sleeping - 2 - -  Tired, decreased energy - 2 - -  Change in appetite - 3 - -  Feeling bad or failure about yourself  - 2 - -  Trouble concentrating - 2 - -  Moving slowly or fidgety/restless - 3 - -  Suicidal thoughts - 0 - -  PHQ-9 Score - 19 - -     Review of Systems  Constitutional: Positive for appetite change and unexpected weight change.       Night sweats  Respiratory: Positive for apnea.   Gastrointestinal: Positive for vomiting and abdominal pain.       Easy bleeding  All other systems reviewed and are negative.  Objective:    Physical Exam  physical Exam  Constitutional: She is oriented to person, place, and time. She appears well-developed.  obese  HENT:  Head: Normocephalic and atraumatic.  Eyes: Conjunctivae and EOM are normal. Pupils are equal, round, and reactive to light.  Neck: Normal range of motion. Neck supple.   Musculoskeletal:       Cervical back: She exhibits decreased range of motion. She exhibits no tenderness and no deformity.       Thoracic back: She exhibits decreased range of motion. She exhibits no tenderness.       Lumbar back: She exhibits decreased range of motion and tenderness. She exhibits no deformity and no spasm.  Neurological: She is alert and oriented to person, place, and time. A sensory deficit is present.   Motor strength in lower extremities 4/5 in the right hip flexor and extensor and ankle dorsiflexor and plantar flexor 3 minus at the left hip flexor and extensor ankle dorsiflexor and plantar flexor  Psychiatric: Her affect is labile. Her speech is delayed. She is slowed. Cognition and memory are impaired. She exhibits a depressed mood.  Nursing note and vitals reviewed.   Lumbar flexion 25%, extension 25% lateral bending 25% to the right and to the left, twisting 25% to the right and 75% to the left Range of motion is normal in both upper extremities at the shoulders wrists elbows fingers. Motor strength is 5/5 in the right deltoid, biceps, triceps, grip      Assessment & Plan:  1. Chronic low back pain. There is been no diagnostic workup for this yet. Recommend starting out with an x-ray  Will also prescribe outpatient physical therapy, Did not get in with therapy yet     Given her history of primarily right-sided low back pain as well as prior history of CVA with residual left hemiparesis I suspect she may have some sacroiliac dysfunction.  Will do sacroiliac injection one month  X-rays also show some evidence of lumbar facet arthropathy  Patient does have  left lower extremity numbness but has attributed this to diabetic neuropathy. Certainly it is possible that this could really present radiculopathy instead.  She states she's had some type of nerve testing the past and claims that she was burned from it.  We discussed her opioid risk as being elevated and therefore would not be using narcotic analgesics, furthermore she has a history of CVA and has an elevated fall risk  2. Depression, She will follow up with psychiatry  3. Diabetes with probable neuropathy. Do not see record of EMG testing, may have had neuro-metric testing at pain clinic but this would not be the same as a standard EMG. Consider EMG testing left lower extremity.

## 2014-10-11 ENCOUNTER — Other Ambulatory Visit: Payer: Self-pay | Admitting: Internal Medicine

## 2014-10-11 ENCOUNTER — Other Ambulatory Visit: Payer: Self-pay | Admitting: Nurse Practitioner

## 2014-10-11 ENCOUNTER — Telehealth: Payer: Self-pay | Admitting: *Deleted

## 2014-10-11 NOTE — Telephone Encounter (Signed)
Dao called to request to medical information to be faxed to [phone] for her to get certified for e-z-ride ([employer])  I have called her back and told her that she must have them fax a form to [name] and we will fill out but we do not call or fax info without it. I gave her our fax number.

## 2014-10-17 ENCOUNTER — Telehealth: Payer: Self-pay | Admitting: Rehabilitation

## 2014-10-17 ENCOUNTER — Encounter: Payer: Self-pay | Admitting: Rehabilitation

## 2014-10-17 ENCOUNTER — Ambulatory Visit: Payer: Medicare Other | Attending: Physical Medicine & Rehabilitation | Admitting: Rehabilitation

## 2014-10-17 DIAGNOSIS — R531 Weakness: Secondary | ICD-10-CM | POA: Insufficient documentation

## 2014-10-17 DIAGNOSIS — G8190 Hemiplegia, unspecified affecting unspecified side: Secondary | ICD-10-CM | POA: Insufficient documentation

## 2014-10-17 DIAGNOSIS — G8194 Hemiplegia, unspecified affecting left nondominant side: Secondary | ICD-10-CM

## 2014-10-17 DIAGNOSIS — R2681 Unsteadiness on feet: Secondary | ICD-10-CM | POA: Diagnosis present

## 2014-10-17 DIAGNOSIS — M545 Low back pain, unspecified: Secondary | ICD-10-CM

## 2014-10-17 DIAGNOSIS — R269 Unspecified abnormalities of gait and mobility: Secondary | ICD-10-CM | POA: Diagnosis present

## 2014-10-17 DIAGNOSIS — I69359 Hemiplegia and hemiparesis following cerebral infarction affecting unspecified side: Secondary | ICD-10-CM

## 2014-10-17 NOTE — Therapy (Signed)
[facility] [address] [place], [place], [address] Phone: [phone]   Fax:  [phone]  Physical Therapy Evaluation  Patient Details  Name: [name] MRN: [id number] Date of Birth: 1966-01-04 Referring Reis Pienta:  Charlett Blake, MD  Encounter Date: 10/17/2014      PT End of Session - 10/17/14 1025    Visit Number 1   Number of Visits 17  eval +16 visits   Date for PT Re-Evaluation 12/16/14   Authorization Type [employer] MCR, MCD secondary, G codes due to every 10th visit   PT Start Time 0800   PT Stop Time 0845   PT Time Calculation (min) 45 min   Activity Tolerance Patient tolerated treatment well   Behavior During Therapy [facility] for tasks assessed/performed      Past Medical History  Diagnosis Date  . Hypertension   . Asthma   . Seizures   . Heart murmur     born with   . Dysrhythmia     skipped beats  . Myocardial infarction     hi of x 2  last one 2003   . COPD (chronic obstructive pulmonary disease)     chronic bronchitis   . Pneumonia     hx of 2009  . GERD (gastroesophageal reflux disease)   . Headache(784.0)     migraines   . Neuropathy     due to diabetes   . Dizziness     secondary to diabetes and hypertension   . Arthritis     joint pain   . Hx of blood clots     hematomas removed from left side of brain from 74mo to 89yrold   . Epilepsy idiopathic petit mal     last seizure 2012;controlled w/ topomax  . Diabetic neuropathy, painful     FEET AND HANDS  . Diabetes mellitus     since age [age]type 2 IDDM  . Sleep apnea     [facility];does not use Cpap ; mild  . MIGRAINE HEADACHE 09/14/2006    Qualifier: Diagnosis of  By: MaHassell DoneNP, NyTori Milks  . Glaucoma     NOT ON ANY EYE DROPS   . Legally blind     left eye   . Stroke     last 2003  RESIDUAL LEFT LEG WEAKNESS--NO OTHER RESIDUAL PROBLEMS  . CVA 11/14/2007    Qualifier: Diagnosis of  By: MaHassell DoneFNP, NyTori Milks  . Pain 09/23/11    LOWER BACK AND RIGHT SIDE--PT HAS RIGHT URETERAL STONE  . Nephrolithiasis     frequent urination , urination at nite  PT SEEN IN ER 09/22/11 FOR BACK AND RT SIDED PAIN--HAS KNOWN STONE RT URETER AND UA IN ER SHOWED UTI  . Anxiety   . Depression   . Stress incontinence   . Hyperlipidemia   . Pyelonephritis     Past Surgical History  Procedure Laterality Date  . Kidney stone removal    . Brain hematoma evacuation      five procedures total, first procedure when [age]onths old, last at [age]ears of age.  . Shunt removal      shunt inserted at age 2 8emoved at age 49 . Eye surgery    . Retinal detachment surgery  1990  . Vaginal hysterectomy  1996  . Incisional hernia repair  05/05/2011    Procedure: LAPAROSCOPIC INCISIONAL HERNIA;  Surgeon: MaRolm Bookbinder  MD;  Location: [facility];  Service: General;  Laterality: N/A;  . Mass excision  05/05/2011    Procedure: EXCISION MASS;  Surgeon: Rolm Bookbinder, MD;  Location: [facility];  Service: General;  Laterality: Right;  . Hernia repair  05/05/11    lap incisional hernia  . Pcnl    . Cystoscopy/retrograde/ureteroscopy  08/24/2011    Procedure: CYSTOSCOPY/RETROGRADE/URETEROSCOPY;  Surgeon: Molli Hazard, MD;  Location: [facility];  Service: Urology;  Laterality: Right;  Cysto, Right retrograde Pyelogram, right stent placement.   . Cystoscopy with ureteroscopy  09/24/2011    Procedure: CYSTOSCOPY WITH URETEROSCOPY;  Surgeon: Malka So, MD;  Location: [facility];  Service: Urology;  Laterality: Right;  Balloon dilation right ureter   . Cystoscopy w/ retrogrades  09/24/2011    Procedure: CYSTOSCOPY WITH RETROGRADE PYELOGRAM;  Surgeon: Malka So, MD;  Location: [facility];  Service: Urology;  Laterality: Right;    There were no vitals filed for this visit.  Visit Diagnosis:  Left hemiparesis - Plan: PT plan of care cert/re-cert  Left-sided low back pain without sciatica - Plan: PT plan of care  cert/re-cert  Abnormality of gait - Plan: PT plan of care cert/re-cert  Unsteadiness - Plan: PT plan of care cert/re-cert  Generalized weakness - Plan: PT plan of care cert/re-cert      Subjective Assessment - 10/17/14 0809    Subjective Pt presents today wanting to address pain in low back and per MD orders would like to have PT instead of medication to see if this can address pain.     Limitations Standing;Walking;House hold activities   Patient Stated Goals To get rid of this pain, to fall less   Currently in Pain? Yes   Pain Score 8    Pain Location Back   Pain Orientation Lower;Left   Pain Descriptors / Indicators Aching;Burning   Pain Type Chronic pain   Pain Radiating Towards Radiates towards L leg   Pain Onset More than a month ago   Aggravating Factors  walking, standing    Pain Relieving Factors heat, medication   Effect of Pain on Daily Activities limits amount that she can do on a daily basis            [facility] PT Assessment - 10/17/14 0001    Assessment   Medical Diagnosis SI and low back pain, L hemiparesis   Onset Date/Surgical Date 10/17/11  has had 8 CVA's between 2003-2013 and back pain started then   Precautions   Precautions Fall   Restrictions   Weight Bearing Restrictions No   Balance Screen   Has the patient fallen in the past 6 months Yes   How many times? 4   Has the patient had a decrease in activity level because of a fear of falling?  Yes   Is the patient reluctant to leave their home because of a fear of falling?  Yes   [place] Private residence   Living Arrangements Children   Available Help at Discharge Family;Available PRN/intermittently  daughter works during day, has aide 10-12 M-F   Type of [place] to enter   Entrance Stairs-Number of Steps 4   Entrance Stairs-Rails Left   Home Layout Two level;Able to live on main level with bedroom/bathroom  full bath upstairs   Alternate  Level Stairs-Number of Steps 12   Alternate Level Stairs-Rails Left   Home Equipment Walker - 4 wheels;Cane -  quad;Cane - single point  tub/shower   Additional Comments daughter is 2 months pregnant.    Prior Function   Level of Independence Requires assistive device for independence;Needs assistance with homemaking;Other (comment)  has assist for shower in am, does I in pm   Vocation Retired   [employer] is a retired Holiday representative   Overall Cognitive Status Within Functional Limits for tasks assessed   Observation/Other Assessments   Focus on Therapeutic Outcomes (FOTO)  Stroke IS-mobility  41.7%   Sensation   Light Touch Impaired Detail   Light Touch Impaired Details Absent LLE  absent at S1 dermatome   Coordination   Gross Motor Movements are Fluid and Coordinated No   Fine Motor Movements are Fluid and Coordinated No   Coordination and Movement Description Limited in LUE and LLE due to strength   Heel Shin Test WFL   ROM / Strength   AROM / PROM / Strength Strength   Strength   Overall Strength Deficits   Overall Strength Comments L hip flex 2/5, hip ext 3+/5, knee ext 2+/5, knee flex 3+/5, ankle DF 2+/5, PF 3/5, RLE WFL   Transfers   Transfers Sit to Stand;Stand to Sit   Sit to Stand 6: Modified independent (Device/Increase time)   Stand to Sit 6: Modified independent (Device/Increase time)   Ambulation/Gait   Ambulation/Gait Yes   Ambulation/Gait Assistance 5: Supervision   Ambulation/Gait Assistance Details Provided S for safety.  note intermittent L toe drag, however pt able to self correct with no overt LOB.    Ambulation Distance (Feet) 200 Feet   Assistive device None   Gait Pattern Step-through pattern;Decreased arm swing - left;Decreased stance time - left;Decreased stride length;Decreased hip/knee flexion - left;Decreased dorsiflexion - left;Trunk flexed;Poor foot clearance - left   Ambulation Surface Level;Indoor   Gait velocity 19.28=1.70 ft/sec    Stairs Yes   Stairs Assistance 5: Supervision;4: Min guard   Stairs Assistance Details (indicate cue type and reason) min/guard to steady    Stair Management Technique One rail Right;Alternating pattern;Step to pattern;Forwards   Number of Stairs 4   Height of Stairs 6   Functional Gait  Assessment   Gait assessed  Yes   Gait Level Surface Walks 20 ft, slow speed, abnormal gait pattern, evidence for imbalance or deviates 10-15 in outside of the 12 in walkway width. Requires more than 7 sec to ambulate 20 ft.   Change in Gait Speed Makes only minor adjustments to walking speed, or accomplishes a change in speed with significant gait deviations, deviates 10-15 in outside the 12 in walkway width, or changes speed but loses balance but is able to recover and continue walking.   Gait with Horizontal Head Turns Performs head turns with moderate changes in gait velocity, slows down, deviates 10-15 in outside 12 in walkway width but recovers, can continue to walk.   Gait with Vertical Head Turns Performs task with slight change in gait velocity (eg, minor disruption to smooth gait path), deviates 6 - 10 in outside 12 in walkway width or uses assistive device   Gait and Pivot Turn Pivot turns safely in greater than 3 sec and stops with no loss of balance, or pivot turns safely within 3 sec and stops with mild imbalance, requires small steps to catch balance.   Step Over Obstacle Is able to step over one shoe box (4.5 in total height) but must slow down and adjust steps to clear box safely. May require  verbal cueing.   Gait with Narrow Base of Support Ambulates less than 4 steps heel to toe or cannot perform without assistance.   Gait with Eyes Closed Walks 20 ft, slow speed, abnormal gait pattern, evidence for imbalance, deviates 10-15 in outside 12 in walkway width. Requires more than 9 sec to ambulate 20 ft.   Ambulating Backwards Walks 20 ft, slow speed, abnormal gait pattern, evidence for imbalance,  deviates 10-15 in outside 12 in walkway width.   Steps Alternating feet, must use rail.   Total Score 11                           PT Education - 10/17/14 1024    Education provided Yes   Education Details Educated pt on evaluation and balance test findings, POC and need for OT   Person(s) Educated Patient   Methods Explanation;Demonstration   Comprehension Verbalized understanding;Need further instruction          PT Short Term Goals - 10/17/14 1036    PT SHORT TERM GOAL #1   Title Pt will initiate HEP for balance and strengthening to increase functional strength and decrease fall risk.  (Target date: 11/14/14)   Baseline Pt is dependent with HEP.    Time 4   Period Weeks   PT SHORT TERM GOAL #2   Title Pt will increase FGA to 16/30 in order to indicate functional improvement in balance and decreased fall risk. (Target date: 11/14/14)   Baseline 11/30 on 10/17/14   Time 4   Period Weeks   PT SHORT TERM GOAL #3   Title Pt will increase gait speed to 2.30 ft/sec in order to indicate decreased fall risk and improved efficiency of gait.  (Target date: 11/14/14)   Baseline 1.70 ft/sec on 10/17/14   Time 4   Period Weeks   PT SHORT TERM GOAL #4   Title Pt will verbalize CVA warning signs and risk factors independently in order to ensure pt understanding of when to seek medical care.  (Target date: 11/14/14)   Baseline Pt dependent at this time   Time 4   Period Weeks   PT SHORT TERM GOAL #5   Title Pt will ambulate >300' with LRAD at S level negotiating around and over obstacles without overt LOB to indicate safe home negotiation.    Baseline Min/guard to min A for 200' due to safety concerns and LOB.    Time 4   Period Weeks           PT Long Term Goals - 10/17/14 1041    PT LONG TERM GOAL #1   Title Pt will be independent with HEP to indicate compliance and increased strengthening and decreased fall risk.  (Target Date: 12/12/14)   Baseline Pt  dependent with HEP   Time 8   Period Weeks   PT LONG TERM GOAL #2   Title Pt will increase FGA score to 21/30 in order to indicate decreased fall risk and improved balance.  (Target Date: 12/12/14)   Baseline 11/30 on 10/17/14   Time 8   Period Weeks   PT LONG TERM GOAL #3   Title Pt will increase gait speed to 2.90 ft/sec in order to indicate decreased fall risk, return to community ambulation, and increased efficiency of gait. (Target Date: 12/12/14)   Baseline 1.70 ft/sec on 10/17/14   Time 8   Period Weeks   PT LONG TERM GOAL #4  Title Pt will ambulate >300' on outdoor surfaces including grass, curb and ramp without AD at mod I level in order to safely traverse in community.  (Target Date: 12/12/14)   Baseline Min/guard to min A over indoor surfaces   Time 8   Period Weeks   PT LONG TERM GOAL #5   Title Pt will increase [place] Impact Scale score to 56.8% to indicate improved pt perceived functional mobility status.  (Target Date: 12/12/14)   Baseline 41.7% on 2014/11/13   Time 8   Period Weeks               Plan - 13-Nov-2014 1026    Clinical Impression Statement Pt presents with L hemiparesis from previous strokes as well as low back pain.  States that she has had 8 CVA's from 2003-2013 and has had increasing back pain in between time of strokes.  Also note pt reported decreased balance and is legally blind in L eye from glaucoma.  Based on PT evaluation findings, pt with FGA score of 11/30 placing at significant risk for falls and gait speed of 1.70 ft/sec indicative of recurrent fall risk.  Based on findings and deficits, pt would greatly benefit from skilled OP neuro PT and also feel she would benefit from OT as well.  Will send encounter to referring MD to request OT order.     Pt will benefit from skilled therapeutic intervention in order to improve on the following deficits Abnormal gait;Decreased activity tolerance;Decreased balance;Decreased coordination;Decreased  endurance;Decreased knowledge of precautions;Decreased knowledge of use of DME;Decreased mobility;Decreased safety awareness;Decreased strength;Difficulty walking;Impaired perceived functional ability;Impaired sensation;Decreased range of motion;Impaired UE functional use;Impaired vision/preception;Improper body mechanics;Postural dysfunction;Pain   Rehab Potential Good   Clinical Impairments Affecting Rehab Potential pt compliance with recommendations   PT Frequency 2x / week   PT Duration 8 weeks   PT Treatment/Interventions ADLs/Self Care Home Management;Electrical Stimulation;DME Instruction;Gait training;Stair training;Functional mobility training;Therapeutic activities;Therapeutic exercise;Balance training;Neuromuscular re-education;Patient/family education;Orthotic Fit/Training;Visual/perceptual remediation/compensation   PT Next Visit Plan Establish HEP for LLE strengthening, fall prevention strategies, CVA warning signs, check status of OT order   Consulted and Agree with Plan of Care Patient          G-Codes - 2014-11-13 1055    Functional Assessment Tool Used FGA 11/30   Functional Limitation Mobility: Walking and moving around   Mobility: Walking and Moving Around Current Status [phone]) At least 60 percent but less than 80 percent impaired, limited or restricted   Mobility: Walking and Moving Around Goal Status [phone]) At least 20 percent but less than 40 percent impaired, limited or restricted       Problem List Patient Active Problem List   Diagnosis Date Noted  . Sacroiliac dysfunction 10/09/2014  . Chronic low back pain 09/03/2014  . CVA, old, hemiparesis 09/03/2014  . OSA (obstructive sleep apnea) 05/15/2014  . Restrictive airway disease 05/15/2014  . Colitis 02/22/2014  . Type 2 diabetes, uncontrolled, with renal manifestation 01/21/2014  . Essential hypertension, benign 01/21/2014  . Chronic pain disorder 01/21/2014  . Migraine 09/07/2013  . DM type 2,  uncontrolled, with neuropathy 05/23/2013  . Mixed urge and stress incontinence 08/25/2011  . Ureteral stricture, right 08/25/2011  . Pyelonephritis 08/22/2011  . Nephrolithiasis 08/22/2011  . Incisional hernia 05/10/2011  . Obesity, Class III, BMI 40-49.9 (morbid obesity) 05/10/2011  . Tachycardia 05/08/2011  . DIABETES MELLITUS, TYPE II 09/30/2006  . BLINDNESS, [place], [place] DEFINITION 09/14/2006  . HYPERLIPIDEMIA 09/13/2006  . Anxiety and depression 09/13/2006  .  Essential hypertension 09/13/2006  . MYOCARDIAL INFARCTION, HX OF 09/13/2006  . GERD 09/13/2006  . SEIZURE DISORDER 09/13/2006    Cameron Sprang, PT, MPT [facility] [address] [place], [place], [address] Phone: [phone]   Fax:  [phone] 10/17/2014, 10:58 AM

## 2014-10-17 NOTE — Telephone Encounter (Signed)
PT evaluation was completed today 10/17/14. Due to limited functional use of LUE and pain in shoulder with active ROM, patient would benefit from outpatient OT.  If you agree, please submit an order for outpatient OT.  Thanks so much,   Cameron Sprang, PT, MPT [facility] [address] [place], [place], [address] Phone: [phone]   Fax:  [phone] 10/17/2014, 11:55 AM

## 2014-10-19 ENCOUNTER — Encounter: Payer: Self-pay | Admitting: Adult Health

## 2014-10-19 ENCOUNTER — Ambulatory Visit (INDEPENDENT_AMBULATORY_CARE_PROVIDER_SITE_OTHER): Payer: Medicare Other | Admitting: Adult Health

## 2014-10-19 VITALS — BP 128/72 | HR 72 | Temp 98.0°F | Ht 66.0 in | Wt 223.0 lb

## 2014-10-19 DIAGNOSIS — Z23 Encounter for immunization: Secondary | ICD-10-CM | POA: Diagnosis not present

## 2014-10-19 DIAGNOSIS — G4733 Obstructive sleep apnea (adult) (pediatric): Secondary | ICD-10-CM

## 2014-10-19 DIAGNOSIS — J984 Other disorders of lung: Secondary | ICD-10-CM

## 2014-10-19 NOTE — Patient Instructions (Addendum)
CPAP compliance with goal of at least 4-6 hrs every night is the expectation. Advised against medications with sedative side effects Continue on Symbicort 2 puffs daily Flu shot today .  Work on weight loss.  Follow up Dr. Elsworth Soho  In 6 months and As needed

## 2014-10-19 NOTE — Progress Notes (Signed)
   Subjective:    Patient ID: [name], female    DOB: 1965/03/08, 49 y.o.   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  She was evaluated in 06/2013 for asthma and chronic bronchitis She quit smoking in 1989, about 20 pack years. She reports exposure to secondhand smoke-her husband smoked about 3 packs per day.  Download 06/2014 >> usage only 2h, avg pr 10 cm, no residuals   10/19/2014 Follow up : OSA and chronic bronchitis  Pt returns for 5 month follow up for sleep apnea.  Doing okay on CPAP .  No mask issues.  Download shows  Okay compliance, w/ AHI 2.2 , avg usage 4hr  Remains on Symbicort Twice daily  . Does get winded with some activities but at baseline.  No chest pain, orthopnea, edema or fever   Significant tests/ events PSG 02/2013 -wt - 245 pounds -total sleep time 349 minutes, AHI 6 per hour, lowest desat 82% PFTs 06/2013 -moderate restriction, ratio normal  Review of Systems neg for any significant sore throat, dysphagia, itching, sneezing, nasal congestion or excess/ purulent secretions, fever, chills, sweats, unintended wt loss, pleuritic or exertional cp, hempoptysis, orthopnea pnd or change in chronic leg swelling. Also denies presyncope, palpitations, heartburn, abdominal pain, nausea, vomiting, diarrhea or change in bowel or urinary habits, dysuria,hematuria, rash, arthralgias, visual complaints, headache, numbness weakness or ataxia.     Objective:   Physical Exam  Gen. Pleasant, obese, in no distress ENT - no lesions, no post nasal drip Neck: No JVD, no thyromegaly, no carotid bruits Lungs: no use of accessory muscles, no dullness to percussion, decreased without rales or rhonchi  Cardiovascular: Rhythm regular, heart sounds  normal, no murmurs or gallops, no peripheral edema Musculoskeletal: No deformities, no cyanosis or  clubbing , no tremors       Assessment & Plan:

## 2014-10-19 NOTE — Addendum Note (Signed)
Addended by: Charlett Blake on: 10/19/2014 09:01 AM   Modules accepted: Orders

## 2014-10-23 ENCOUNTER — Encounter: Payer: Self-pay | Admitting: Rehabilitation

## 2014-10-23 ENCOUNTER — Ambulatory Visit: Payer: Medicare Other | Attending: Physical Medicine & Rehabilitation | Admitting: Rehabilitation

## 2014-10-23 DIAGNOSIS — R531 Weakness: Secondary | ICD-10-CM | POA: Insufficient documentation

## 2014-10-23 DIAGNOSIS — M6281 Muscle weakness (generalized): Secondary | ICD-10-CM | POA: Insufficient documentation

## 2014-10-23 DIAGNOSIS — M545 Low back pain, unspecified: Secondary | ICD-10-CM

## 2014-10-23 DIAGNOSIS — R201 Hypoesthesia of skin: Secondary | ICD-10-CM | POA: Insufficient documentation

## 2014-10-23 DIAGNOSIS — R2681 Unsteadiness on feet: Secondary | ICD-10-CM | POA: Diagnosis present

## 2014-10-23 DIAGNOSIS — M62830 Muscle spasm of back: Secondary | ICD-10-CM | POA: Diagnosis present

## 2014-10-23 DIAGNOSIS — R269 Unspecified abnormalities of gait and mobility: Secondary | ICD-10-CM | POA: Diagnosis present

## 2014-10-23 DIAGNOSIS — G8194 Hemiplegia, unspecified affecting left nondominant side: Secondary | ICD-10-CM | POA: Insufficient documentation

## 2014-10-23 DIAGNOSIS — I69952 Hemiplegia and hemiparesis following unspecified cerebrovascular disease affecting left dominant side: Secondary | ICD-10-CM | POA: Diagnosis present

## 2014-10-23 DIAGNOSIS — M79602 Pain in left arm: Secondary | ICD-10-CM | POA: Diagnosis present

## 2014-10-23 NOTE — Therapy (Signed)
[facility] [address] [place], [place], [address] Phone: [phone]   Fax:  [phone]  Physical Therapy Treatment  Patient Details  Name: [name] MRN: [id number] Date of Birth: February 10, 1965 Referring Provider:  Lauree Chandler, NP  Encounter Date: 10/23/2014      PT End of Session - 10/23/14 1632    Visit Number 2   Number of Visits 17   Date for PT Re-Evaluation 12/16/14   Authorization Type U[employer] MCR, MCD secondary, G codes due to every 10th visit   PT Start Time 1230   PT Stop Time 1315   PT Time Calculation (min) 45 min   Activity Tolerance Patient tolerated treatment well   Behavior During Therapy [facility] for tasks assessed/performed      Past Medical History  Diagnosis Date  . Hypertension   . Asthma   . Seizures ([place])   . Heart murmur     born with   . Dysrhythmia     skipped beats  . Myocardial infarction ([place])     hi of x 2  last one 2003   . COPD (chronic obstructive pulmonary disease) (H[facility])     chronic bronchitis   . Pneumonia     hx of 2009  . GERD (gastroesophageal reflux disease)   . Headache(784.0)     migraines   . Neuropathy ([place])     due to diabetes   . Dizziness     secondary to diabetes and hypertension   . Arthritis     joint pain   . Hx of blood clots     hematomas removed from left side of brain from 58mo to 49yro[age]   . Epilepsy idiopathic petit mal ([place]    last seizure 2012;controlled w/ topomax  . Diabetic neuropathy, painful ([place]    FEET AND HANDS  . Diabetes mellitus     since age [age]type 2 IDDM  . Sleep apnea     [facility];does not use Cpap ; mild  . MIGRAINE HEADACHE 09/14/2006    Qualifier: Diagnosis of  By: MaHassell DoneNP, NyTori Milks  . Glaucoma     NOT ON ANY EYE DROPS   . Legally blind     left eye   . [facility]    last 2003  RESIDUAL LEFT LEG WEAKNESS--NO OTHER RESIDUAL PROBLEMS  . CVA 11/14/2007    Qualifier:  Diagnosis of  By: MaHassell DoneNP, NyTori Milks  . Pain 09/23/11    LOWER BACK AND RIGHT SIDE--PT HAS RIGHT URETERAL STONE  . Nephrolithiasis     frequent urination , urination at nite  PT SEEN IN ER 09/22/11 FOR BACK AND RT SIDED PAIN--HAS KNOWN STONE RT URETER AND UA IN ER SHOWED UTI  . Anxiety   . Depression   . Stress incontinence   . Hyperlipidemia   . Pyelonephritis     Past Surgical History  Procedure Laterality Date  . Kidney stone removal    . Brain hematoma evacuation      five procedures total, first procedure when [age]onths old, last at [age]ears of age.  . Shunt removal      shunt inserted at age [age]emoved at age 89 . Eye surgery    . Retinal detachment surgery  1990  . Vaginal hysterectomy  1996  . Incisional hernia repair  05/05/2011    Procedure: LAPAROSCOPIC INCISIONAL HERNIA;  Surgeon: Rolm Bookbinder, MD;  Location: [facility];  Service: General;  Laterality: N/A;  . Mass excision  05/05/2011    Procedure: EXCISION MASS;  Surgeon: Rolm Bookbinder, MD;  Location: [facility];  Service: General;  Laterality: Right;  . Hernia repair  05/05/11    lap incisional hernia  . Pcnl    . Cystoscopy/retrograde/ureteroscopy  08/24/2011    Procedure: CYSTOSCOPY/RETROGRADE/URETEROSCOPY;  Surgeon: Molli Hazard, MD;  Location: [facility];  Service: Urology;  Laterality: Right;  Cysto, Right retrograde Pyelogram, right stent placement.   . Cystoscopy with ureteroscopy  09/24/2011    Procedure: CYSTOSCOPY WITH URETEROSCOPY;  Surgeon: Malka So, MD;  Location: [facility];  Service: Urology;  Laterality: Right;  Balloon dilation right ureter   . Cystoscopy w/ retrogrades  09/24/2011    Procedure: CYSTOSCOPY WITH RETROGRADE PYELOGRAM;  Surgeon: Malka So, MD;  Location: [facility];  Service: Urology;  Laterality: Right;    There were no vitals filed for this visit.  Visit Diagnosis:  Abnormality of gait  Unsteadiness  Generalized weakness  Left-sided low back pain without sciatica       Subjective Assessment - 10/23/14 1239    Subjective "I fell."  "I fell out of the bed trying to make it up."  Provided education on how better to make bed by walking around to other side instead of leaning or waiting for family to assis.t     Limitations Standing;Walking;House hold activities   Patient Stated Goals To get rid of this pain, to fall less   Currently in Pain? Yes   Pain Score 9    Pain Location Arm   Pain Orientation Left   Pain Descriptors / Indicators Aching   Pain Type Chronic pain   Pain Radiating Towards Goes down arm to hand   Pain Onset More than a month ago   Pain Frequency Constant   Aggravating Factors  walking, standing   Pain Relieving Factors heat, medication                         [facility] Adult PT Treatment/Exercise - 10/23/14 1230    Transfers   Transfers Sit to Stand;Stand to Sit   Sit to Stand 6: Modified independent (Device/Increase time)   Stand to Sit 6: Modified independent (Device/Increase time)   Ambulation/Gait   Ambulation/Gait Yes   Ambulation/Gait Assistance 5: Supervision;4: Min guard;6: Modified independent (Device/Increase time)   Ambulation/Gait Assistance Details Pt S to mod I overall with use of SPC, however did have single LOB when leaving clinic and requires min/guard to correct balance.      Ambulation Distance (Feet) 250 Feet   Assistive device None;Straight cane   Gait Pattern Step-through pattern;Decreased arm swing - left;Decreased stance time - left;Decreased stride length;Decreased hip/knee flexion - left;Decreased dorsiflexion - left;Trunk flexed;Poor foot clearance - left   Gait velocity --   Stairs --   Stairs Assistance --   Stair Management Technique --   Number of Stairs --   Height of Stairs --   Gait Comments Feel that pt safer at this time with use of SPC and recommend she use at all times.  cues for safe placement and increased stride length.  Also provided cues for upright posture throughout.     Neuro Re-ed    Neuro Re-ed Details  Performed BLE>LLE only bridging in order to increase LLE strength with focus on symmetrical movement x 10 reps each.  Note pt with  increased back pain, therefore added posterior pelvic tilt in efforts to stabilize core during activty.  Progressed to performing tall kneeling tasks for more proximal trunk and hip stabilization and LLE WB with squats in tall kneeling x 15 reps with visual and verbal cues for equal WB as pt tends to shift weight to the R and avoid LLE.  Also performed side stepping in tall kneeling 2x 5 reps each direction with tactile cues at hips for increased hip extension throughout.  Also performed gait while kicking bean bag alternating LE to work on balance, weight shifting, WB on LLE and BLE strengthening.  Tolerated well at min/guard level with min cues slower speed of use in LLE as pt tends to overshoot target when attempting to kick.     Exercises   Exercises Other Exercises   Other Exercises  Provided pt with L PF stretch while standing against wall and R knee flexed, leaning towards wall.  Pt tolerated well with cues for 60 sec hold.  Performed posterior pelvic tilt x 10 reps with tactile cues from therapist for technique progressing to holding posterior tilt while marching R then L LE x 10 reps each LE.  Again, requires cues for continued breathing and technique.                  PT Education - 10/23/14 1631    Education provided Yes   Education Details Education on use of SPC at home for safety, provided different ways of making bed to avoid falls, educated on L calf stretch in standing.     Person(s) Educated Patient   Methods Explanation;Demonstration   Comprehension Verbalized understanding;Need further instruction;Returned demonstration          PT Short Term Goals - 10/17/14 1036    PT SHORT TERM GOAL #1   Title Pt will initiate HEP for balance and strengthening to increase functional strength and decrease fall risk.   (Target date: 11/14/14)   Baseline Pt is dependent with HEP.    Time 4   Period Weeks   PT SHORT TERM GOAL #2   Title Pt will increase FGA to 16/30 in order to indicate functional improvement in balance and decreased fall risk. (Target date: 11/14/14)   Baseline 11/30 on 10/17/14   Time 4   Period Weeks   PT SHORT TERM GOAL #3   Title Pt will increase gait speed to 2.30 ft/sec in order to indicate decreased fall risk and improved efficiency of gait.  (Target date: 11/14/14)   Baseline 1.70 ft/sec on 10/17/14   Time 4   Period Weeks   PT SHORT TERM GOAL #4   Title Pt will verbalize CVA warning signs and risk factors independently in order to ensure pt understanding of when to seek medical care.  (Target date: 11/14/14)   Baseline Pt dependent at this time   Time 4   Period Weeks   PT SHORT TERM GOAL #5   Title Pt will ambulate >300' with LRAD at S level negotiating around and over obstacles without overt LOB to indicate safe home negotiation.    Baseline Min/guard to min A for 200' due to safety concerns and LOB.    Time 4   Period Weeks           PT Long Term Goals - 10/17/14 1041    PT LONG TERM GOAL #1   Title Pt will be independent with HEP to indicate compliance and increased strengthening and decreased fall risk.  (  Target Date: 12/12/14)   Baseline Pt dependent with HEP   Time 8   Period Weeks   PT LONG TERM GOAL #2   Title Pt will increase FGA score to 21/30 in order to indicate decreased fall risk and improved balance.  (Target Date: 12/12/14)   Baseline 11/30 on 10/17/14   Time 8   Period Weeks   PT LONG TERM GOAL #3   Title Pt will increase gait speed to 2.90 ft/sec in order to indicate decreased fall risk, return to community ambulation, and increased efficiency of gait. (Target Date: 12/12/14)   Baseline 1.70 ft/sec on 10/17/14   Time 8   Period Weeks   PT LONG TERM GOAL #4   Title Pt will ambulate >300' on outdoor surfaces including grass, curb and ramp without  AD at mod I level in order to safely traverse in community.  (Target Date: 12/12/14)   Baseline Min/guard to min A over indoor surfaces   Time 8   Period Weeks   PT LONG TERM GOAL #5   Title Pt will increase [place] Impact Scale score to 56.8% to indicate improved pt perceived functional mobility status.  (Target Date: 12/12/14)   Baseline 41.7% on 10/17/14   Time 8   Period Weeks               Plan - 10/23/14 1632    Clinical Impression Statement Skilled session focused on core and proximal strengthening and stability as well as L LE WB and strengthening with supine and tall kneeling tasks as well as more dynamic gait tasks.  Tolerated well however is limited by increased back pain.  Note that she is also very limited in decreased shoulder/arm movement and pain.  Feel pt may benefit from OT order and therefore PT will send encounter to MD.   Continue POC.    Pt will benefit from skilled therapeutic intervention in order to improve on the following deficits Abnormal gait;Decreased activity tolerance;Decreased balance;Decreased coordination;Decreased endurance;Decreased knowledge of precautions;Decreased knowledge of use of DME;Decreased mobility;Decreased safety awareness;Decreased strength;Difficulty walking;Impaired perceived functional ability;Impaired sensation;Decreased range of motion;Impaired UE functional use;Impaired vision/preception;Improper body mechanics;Postural dysfunction;Pain   Rehab Potential Good   Clinical Impairments Affecting Rehab Potential pt compliance with recommendations   PT Frequency 2x / week   PT Duration 8 weeks   PT Treatment/Interventions ADLs/Self Care Home Management;Electrical Stimulation;DME Instruction;Gait training;Stair training;Functional mobility training;Therapeutic activities;Therapeutic exercise;Balance training;Neuromuscular re-education;Patient/family education;Orthotic Fit/Training;Visual/perceptual remediation/compensation   PT Next Visit Plan  Establish HEP for LLE strengthening, fall prevention strategies, CVA warning signs    Consulted and Agree with Plan of Care Patient        Problem List Patient Active Problem List   Diagnosis Date Noted  . Sacroiliac dysfunction 10/09/2014  . Chronic low back pain 09/03/2014  . CVA, old, hemiparesis ([place]) 09/03/2014  . OSA (obstructive sleep apnea) 05/15/2014  . Restrictive airway disease 05/15/2014  . Colitis 02/22/2014  . Type 2 diabetes, uncontrolled, with renal manifestation ([place]) 01/21/2014  . Essential hypertension, benign 01/21/2014  . Chronic pain disorder 01/21/2014  . Migraine 09/07/2013  . DM type 2, uncontrolled, with neuropathy ([place]) 05/23/2013  . Mixed urge and stress incontinence 08/25/2011  . Ureteral stricture, right 08/25/2011  . Pyelonephritis 08/22/2011  . Nephrolithiasis 08/22/2011  . Incisional hernia 05/10/2011  . Obesity, Class III, BMI 40-49.9 (morbid obesity) ([place]) 05/10/2011  . Tachycardia 05/08/2011  . DIABETES MELLITUS, TYPE II 09/30/2006  . BLINDNESS, [place], [place] DEFINITION 09/14/2006  . HYPERLIPIDEMIA 09/13/2006  .  Anxiety and depression 09/13/2006  . Essential hypertension 09/13/2006  . MYOCARDIAL INFARCTION, HX OF 09/13/2006  . GERD 09/13/2006  . SEIZURE DISORDER 09/13/2006    Cameron Sprang, PT, MPT [facility] [address] [place], [place], [address] Phone: [phone]   Fax:  [phone] 10/23/2014, 4:37 PM

## 2014-10-25 ENCOUNTER — Ambulatory Visit: Payer: Medicare Other | Admitting: Rehabilitation

## 2014-10-25 ENCOUNTER — Encounter: Payer: Self-pay | Admitting: Rehabilitation

## 2014-10-25 DIAGNOSIS — R269 Unspecified abnormalities of gait and mobility: Secondary | ICD-10-CM

## 2014-10-25 DIAGNOSIS — R531 Weakness: Secondary | ICD-10-CM

## 2014-10-25 DIAGNOSIS — M545 Low back pain, unspecified: Secondary | ICD-10-CM

## 2014-10-25 DIAGNOSIS — R2681 Unsteadiness on feet: Secondary | ICD-10-CM

## 2014-10-25 NOTE — Assessment & Plan Note (Signed)
Continue on Symbicort 2 puffs daily Flu shot today .  Work on weight loss.  Follow up Dr. Elsworth Soho  In 6 months and As needed

## 2014-10-25 NOTE — Patient Instructions (Addendum)
  Lie on your back, arms by your side, bring belly button to floor and lift buttocks up.  Hold up in air for 5 seconds.  Perform x 10 reps.  Do 2 times a day.      Lie on your back, pull belly button towards floor (think about flattening your back as much as possible), Hold for 3 seconds, then relax.  Perform x 10 reps, Do 2 times a day.      Lie on your back as above, perform same movement and while holding spine flat, march your R leg then L leg up, then relax pelvis.  Perform x 10 reps on each leg.  Do 2 times per day.

## 2014-10-25 NOTE — Therapy (Signed)
[facility] [address] [place], [place], [address] Phone: [phone]   Fax:  [phone]  Physical Therapy Treatment  Patient Details  Name: [name] MRN: [id number] Date of Birth: [birth year] Referring Provider:  Lauree Chandler, NP  Encounter Date: 10/25/2014      PT End of Session - 10/25/14 1255    Visit Number 3   Number of Visits 17   Date for PT Re-Evaluation 12/16/14   Authorization Type U[employer] MCR, MCD secondary, G codes due to every 10th visit   PT Start Time 1015   PT Stop Time 1100   PT Time Calculation (min) 45 min   Activity Tolerance Patient tolerated treatment well   Behavior During Therapy [facility] for tasks assessed/performed      Past Medical History  Diagnosis Date  . Hypertension   . Asthma   . Seizures ([place])   . Heart murmur     born with   . Dysrhythmia     skipped beats  . Myocardial infarction ([place])     hi of x 2  last one 2003   . COPD (chronic obstructive pulmonary disease) (H[facility])     chronic bronchitis   . Pneumonia     hx of 2009  . GERD (gastroesophageal reflux disease)   . Headache(784.0)     migraines   . Neuropathy ([place])     due to diabetes   . Dizziness     secondary to diabetes and hypertension   . Arthritis     joint pain   . Hx of blood clots     hematomas removed from left side of brain from 11mo to 49yrold   . Epile[place] idiopathic [place] mal [place]    last seizure 2012;controlled w/ topomax  . Diabetic neuropathy, painful ([facility]    FEET AND HANDS  . Diabetes mellitus     since age [age]type 2 IDDM  . Sleep apnea     [facility];does not use Cpap ; mild  . MIGRAINE HEADACHE 09/14/2006    Qualifier: Diagnosis of  By: MaHassell DoneNP, NyTori Milks  . Glaucoma     NOT ON ANY EYE DROPS   . Legally blind     left eye   . [facility]    last 2003  RESIDUAL LEFT LEG WEAKNESS--NO OTHER RESIDUAL PROBLEMS  . CVA 11/14/2007    Qualifier:  Diagnosis of  By: MaHassell DoneNP, NyTori Milks  . Pain 09/23/11    LOWER BACK AND RIGHT SIDE--PT HAS RIGHT URETERAL STONE  . Nephrolithiasis     frequent urination , urination at nite  PT SEEN IN ER 09/22/11 FOR BACK AND RT SIDED PAIN--HAS KNOWN STONE RT URETER AND UA IN ER SHOWED UTI  . Anxiety   . Depression   . Stress incontinence   . Hyperlipidemia   . Pyelonephritis     Past Surgical History  Procedure Laterality Date  . Kidney stone removal    . Brain hematoma evacuation      five procedures total, first procedure when [age]onths old, last at [age]ears of age.  . Shunt removal      shunt inserted at age 74 31emoved at age 49 . Eye surgery    . Retinal detachment surgery  1990  . Vaginal hysterectomy  1996  . Incisional hernia repair  05/05/2011    Procedure: LAPAROSCOPIC INCISIONAL HERNIA;  Surgeon: Rolm Bookbinder, MD;  Location: [place];  Service: General;  Laterality: N/A;  . Mass excision  05/05/2011    Procedure: EXCISION MASS;  Surgeon: Rolm Bookbinder, MD;  Location: [facility];  Service: General;  Laterality: Right;  . Hernia repair  05/05/11    lap incisional hernia  . Pcnl    . Cystoscopy/retrograde/ureteroscopy  08/24/2011    Procedure: CYSTOSCOPY/RETROGRADE/URETEROSCOPY;  Surgeon: Molli Hazard, MD;  Location: [facility];  Service: Urology;  Laterality: Right;  Cysto, Right retrograde Pyelogram, right stent placement.   . Cystoscopy with ureteroscopy  09/24/2011    Procedure: CYSTOSCOPY WITH URETEROSCOPY;  Surgeon: Malka So, MD;  Location: [facility];  Service: Urology;  Laterality: Right;  Balloon dilation right ureter   . Cystoscopy w/ retrogrades  09/24/2011    Procedure: CYSTOSCOPY WITH RETROGRADE PYELOGRAM;  Surgeon: Malka So, MD;  Location: [facility];  Service: Urology;  Laterality: Right;    There were no vitals filed for this visit.  Visit Diagnosis:  Abnormality of gait  Unsteadiness  Generalized weakness  Left-sided low back pain without sciatica       Subjective Assessment - 10/25/14 1229    Subjective "I'm very frustrated today."  Pt with long discussion with PT regarding wanting to move into her own place.  She also disclosed that daughter has control of most of her money and seems to be spending her money without telling her what it is for and has access to her debit card.     Limitations Standing;Walking;House hold activities   Patient Stated Goals To get rid of this pain, to fall less   Currently in Pain? Yes   Pain Score 8    Pain Location Back   Pain Orientation Lower   Pain Descriptors / Indicators Aching   Pain Type Chronic pain   Pain Onset More than a month ago   Pain Frequency Constant                         [facility] Adult PT Treatment/Exercise - 10/25/14 1015    Transfers   Transfers Sit to Stand;Stand to Sit   Sit to Stand 6: Modified independent (Device/Increase time)   Stand to Sit 6: Modified independent (Device/Increase time)   Ambulation/Gait   Ambulation/Gait Yes   Ambulation/Gait Assistance 5: Supervision;6: Modified independent (Device/Increase time)   Ambulation/Gait Assistance Details Pt able to ambulate short distance today back to treatment mat with use of SPC at S to mod I level.    Ambulation Distance (Feet) 120 Feet   Assistive device Straight cane   Gait Pattern Step-through pattern;Decreased arm swing - left;Decreased stance time - left;Decreased stride length;Decreased hip/knee flexion - left;Decreased dorsiflexion - left;Trunk flexed;Poor foot clearance - left   Ambulation Surface Level;Indoor   Neuro Re-ed    Neuro Re-ed Details  Performed BLE>LLE only bridging in order to increase LLE strength with focus on symmetrical movement x 10 reps each.  During LLE only bridging had LLE off of mat on stool to increase ROM in L hip and decrease use of RLE.  Note pt with increased back pain, therefore added posterior pelvic tilt in efforts to stabilize core during activty.  Performed standing RLE  tapping up to steps in order to increase WB and hip strengthening in LLE.  Note markes muscle soreness therefore allowed breaks in between two sets of 5 reps.  Ended with side stepping in standing position x 10 reps each  direction then progressing to squatted position while side stepping x 10 steps in each direction.  Tolerated well at S level with cues for technique.     Exercises   Exercises Other Exercises   Other Exercises  Provided pt with handout/HEP for B BLE bridging, posterior pelvic tilt and posterior pelvic tilt with marching in order to increase core activation and trunk control to decrease back pain and increase WB and strength through LLE.  See pt instruction for details.                  PT Education - 10/25/14 1254    Education provided Yes   Education Details Had long discussion with pt regarding home situation and the need to move into her own place.  Also feel that daughter may be taking advantage of pts funds and using them as she sees fit.     Person(s) Educated Patient   Methods Explanation   Comprehension Verbalized understanding          PT Short Term Goals - 10/17/14 1036    PT SHORT TERM GOAL #1   Title Pt will initiate HEP for balance and strengthening to increase functional strength and decrease fall risk.  (Target date: 11/14/14)   Baseline Pt is dependent with HEP.    Time 4   Period Weeks   PT SHORT TERM GOAL #2   Title Pt will increase FGA to 16/30 in order to indicate functional improvement in balance and decreased fall risk. (Target date: 11/14/14)   Baseline 11/30 on 10/17/14   Time 4   Period Weeks   PT SHORT TERM GOAL #3   Title Pt will increase gait speed to 2.30 ft/sec in order to indicate decreased fall risk and improved efficiency of gait.  (Target date: 11/14/14)   Baseline 1.70 ft/sec on 10/17/14   Time 4   Period Weeks   PT SHORT TERM GOAL #4   Title Pt will verbalize CVA warning signs and risk factors independently in order to  ensure pt understanding of when to seek medical care.  (Target date: 11/14/14)   Baseline Pt dependent at this time   Time 4   Period Weeks   PT SHORT TERM GOAL #5   Title Pt will ambulate >300' with LRAD at S level negotiating around and over obstacles without overt LOB to indicate safe home negotiation.    Baseline Min/guard to min A for 200' due to safety concerns and LOB.    Time 4   Period Weeks           PT Long Term Goals - 10/17/14 1041    PT LONG TERM GOAL #1   Title Pt will be independent with HEP to indicate compliance and increased strengthening and decreased fall risk.  (Target Date: 12/12/14)   Baseline Pt dependent with HEP   Time 8   Period Weeks   PT LONG TERM GOAL #2   Title Pt will increase FGA score to 21/30 in order to indicate decreased fall risk and improved balance.  (Target Date: 12/12/14)   Baseline 11/30 on 10/17/14   Time 8   Period Weeks   PT LONG TERM GOAL #3   Title Pt will increase gait speed to 2.90 ft/sec in order to indicate decreased fall risk, return to community ambulation, and increased efficiency of gait. (Target Date: 12/12/14)   Baseline 1.70 ft/sec on 10/17/14   Time 8   Period Weeks   PT LONG TERM  GOAL #4   Title Pt will ambulate >300' on outdoor surfaces including grass, curb and ramp without AD at mod I level in order to safely traverse in community.  (Target Date: 12/12/14)   Baseline Min/guard to min A over indoor surfaces   Time 8   Period Weeks   PT LONG TERM GOAL #5   Title Pt will increase [place] Impact Scale score to 56.8% to indicate improved pt perceived functional mobility status.  (Target Date: 12/12/14)   Baseline 41.7% on 10/17/14   Time 8   Period Weeks               Plan - 10/25/14 1256    Clinical Impression Statement Session began with long discussion on home situation and options for moving forward into new place.  Suggested pt live with mother short term to save money and be away from daughter, however  she declines suggestion, stating that she can't be with mother that long.  Will continue to problem solve issue with pt and make suggestions.  Also continue to work on core strengthening and BLE strengthening with LLE WB and strenghtening.     Pt will benefit from skilled therapeutic intervention in order to improve on the following deficits Abnormal gait;Decreased activity tolerance;Decreased balance;Decreased coordination;Decreased endurance;Decreased knowledge of precautions;Decreased knowledge of use of DME;Decreased mobility;Decreased safety awareness;Decreased strength;Difficulty walking;Impaired perceived functional ability;Impaired sensation;Decreased range of motion;Impaired UE functional use;Impaired vision/preception;Improper body mechanics;Postural dysfunction;Pain   Rehab Potential Good   Clinical Impairments Affecting Rehab Potential pt compliance with recommendations   PT Frequency 2x / week   PT Duration 8 weeks   PT Treatment/Interventions ADLs/Self Care Home Management;Electrical Stimulation;DME Instruction;Gait training;Stair training;Functional mobility training;Therapeutic activities;Therapeutic exercise;Balance training;Neuromuscular re-education;Patient/family education;Orthotic Fit/Training;Visual/perceptual remediation/compensation   PT Next Visit Plan Follow up on living situation, go over HEP for compliance, fall prevention, CVA warning signs.    Consulted and Agree with Plan of Care Patient        Problem List Patient Active Problem List   Diagnosis Date Noted  . Sacroiliac dysfunction 10/09/2014  . Chronic low back pain 09/03/2014  . CVA, old, hemiparesis ([place]) 09/03/2014  . OSA (obstructive sleep apnea) 05/15/2014  . Restrictive airway disease 05/15/2014  . Colitis 02/22/2014  . Type 2 diabetes, uncontrolled, with renal manifestation ([place]) 01/21/2014  . Essential hypertension, benign 01/21/2014  . Chronic pain disorder 01/21/2014  . Migraine 09/07/2013  . DM  type 2, uncontrolled, with neuropathy ([place]) 05/23/2013  . Mixed urge and stress incontinence 08/25/2011  . Ureteral stricture, right 08/25/2011  . Pyelonephritis 08/22/2011  . Nephrolithiasis 08/22/2011  . Incisional hernia 05/10/2011  . Obesity, Class III, BMI 40-49.9 (morbid obesity) ([place]) 05/10/2011  . Tachycardia 05/08/2011  . DIABETES MELLITUS, TYPE II 09/30/2006  . BLINDNESS, [place], [place] DEFINITION 09/14/2006  . HYPERLIPIDEMIA 09/13/2006  . Anxiety and depression 09/13/2006  . Essential hypertension 09/13/2006  . MYOCARDIAL INFARCTION, HX OF 09/13/2006  . GERD 09/13/2006  . SEIZURE DISORDER 09/13/2006   Cameron Sprang, PT, MPT [facility] [address] [place], [place], [address] Phone: [phone]   Fax:  [phone] 10/25/2014, 1:03 PM'

## 2014-10-25 NOTE — Assessment & Plan Note (Signed)
OSA   Plan  CPAP compliance with goal of at least 4-6 hrs every night is the expectation. Advised against medications with sedative side effects Continue on Symbicort 2 puffs daily Flu shot today .  Work on weight loss.  Follow up Dr. Elsworth Soho  In 6 months and As needed

## 2014-10-30 ENCOUNTER — Ambulatory Visit: Payer: Medicare Other | Admitting: Occupational Therapy

## 2014-10-30 ENCOUNTER — Ambulatory Visit: Payer: Medicare Other | Admitting: Physical Therapy

## 2014-10-30 ENCOUNTER — Encounter: Payer: Self-pay | Admitting: Occupational Therapy

## 2014-10-30 ENCOUNTER — Encounter: Payer: Self-pay | Admitting: Physical Therapy

## 2014-10-30 DIAGNOSIS — R201 Hypoesthesia of skin: Secondary | ICD-10-CM

## 2014-10-30 DIAGNOSIS — M79602 Pain in left arm: Secondary | ICD-10-CM

## 2014-10-30 DIAGNOSIS — M6281 Muscle weakness (generalized): Secondary | ICD-10-CM

## 2014-10-30 DIAGNOSIS — R269 Unspecified abnormalities of gait and mobility: Secondary | ICD-10-CM

## 2014-10-30 DIAGNOSIS — R2681 Unsteadiness on feet: Secondary | ICD-10-CM

## 2014-10-30 DIAGNOSIS — M545 Low back pain: Secondary | ICD-10-CM

## 2014-10-30 DIAGNOSIS — R531 Weakness: Secondary | ICD-10-CM

## 2014-10-30 DIAGNOSIS — I69952 Hemiplegia and hemiparesis following unspecified cerebrovascular disease affecting left dominant side: Secondary | ICD-10-CM

## 2014-10-30 NOTE — Therapy (Signed)
[name] [address] [place], [place], [address] Phone: [phone]   Fax:  [phone]  Occupational Therapy Evaluation  Patient Details  Name: [name] MRN: [id number] Date of Birth: 1965-02-12 Referring Samel Bruna:  Lauree Chandler, NP  Encounter Date: 10/30/2014      OT End of Session - 10/30/14 1341    Visit Number 1   Number of Visits 8   Date for OT Re-Evaluation 12/25/14   Authorization Type medicare - will need g code   Authorization Time Period 60 days   OT Start Time 0932   OT Stop Time 1015   OT Time Calculation (min) 43 min   Activity Tolerance Patient tolerated treatment well      Past Medical History  Diagnosis Date  . Hypertension   . Asthma   . Seizures ([place])   . Heart murmur     born with   . Dysrhythmia     skipped beats  . Myocardial infarction ([place])     hi of x 2  last one 2003   . COPD (chronic obstructive pulmonary disease) (HCC)     chronic bronchitis   . Pneumonia     hx of 2009  . GERD (gastroesophageal reflux disease)   . Headache(784.0)     migraines   . Neuropathy ([place])     due to diabetes   . Dizziness     secondary to diabetes and hypertension   . Arthritis     joint pain   . Hx of blood clots     hematomas removed from left side of brain from 68mo to 89yrold   . Epilepsy idiopathic petit mal ([facility]    last seizure 2012;controlled w/ topomax  . Diabetic neuropathy, painful ([facility] [place]    FEET AND HANDS  . Diabetes mellitus     since age [age]type 2 IDDM  . Sleep apnea     [facility];does not use Cpap ; mild  . MIGRAINE HEADACHE 09/14/2006    Qualifier: Diagnosis of  By: MaHassell DoneNP, NyTori Milks  . Glaucoma     NOT ON ANY EYE DROPS   . Legally blind     left eye   . [facility]    last 2003  RESIDUAL LEFT LEG WEAKNESS--NO OTHER RESIDUAL PROBLEMS  . CVA 11/14/2007    Qualifier: Diagnosis of  By: MaHassell DoneNP, NyTori Milks  . Pain  09/23/11    LOWER BACK AND RIGHT SIDE--PT HAS RIGHT URETERAL STONE  . Nephrolithiasis     frequent urination , urination at nite  PT SEEN IN ER 09/22/11 FOR BACK AND RT SIDED PAIN--HAS KNOWN STONE RT URETER AND UA IN ER SHOWED UTI  . Anxiety   . Depression   . Stress incontinence   . Hyperlipidemia   . Pyelonephritis     Past Surgical History  Procedure Laterality Date  . Kidney stone removal    . Brain hematoma evacuation      five procedures total, first procedure when 50 months old, last at 49 years of age.  . Shunt removal      shunt inserted at age [age]emoved at age 49 . Eye surgery    . Retinal detachment surgery  1990  . Vaginal hysterectomy  1996  . Incisional hernia repair  05/05/2011    Procedure: LAPAROSCOPIC INCISIONAL HERNIA;  Surgeon: MaRolm BookbinderMD;  [facility];  Service: General;  Laterality: N/A;  . Mass excision  05/05/2011    Procedure: EXCISION MASS;  Surgeon: Rolm Bookbinder, MD;  Location: [place];  Service: General;  Laterality: Right;  . Hernia repair  05/05/11    lap incisional hernia  . Pcnl    . Cystoscopy/retrograde/ureteroscopy  08/24/2011    Procedure: CYSTOSCOPY/RETROGRADE/URETEROSCOPY;  Surgeon: Molli Hazard, MD;  [facility];  Service: Urology;  Laterality: Right;  Cysto, Right retrograde Pyelogram, right stent placement.   . Cystoscopy with ureteroscopy  09/24/2011    Procedure: CYSTOSCOPY WITH URETEROSCOPY;  Surgeon: Malka So, MD;  [facility];  Service: Urology;  Laterality: Right;  Balloon dilation right ureter   . Cystoscopy w/ retrogrades  09/24/2011    Procedure: CYSTOSCOPY WITH RETROGRADE PYELOGRAM;  Surgeon: Malka So, MD;  [facility];  Service: Urology;  Laterality: Right;    There were no vitals filed for this visit.  Visit Diagnosis:  Hemiplegia affecting left side in left-dominant patient as late effect of cerebrovascular disease ([place]) - Plan: Ot plan of care cert/re-cert  Pain of left arm - Plan: Ot  plan of care cert/re-cert  Impaired sensation - Plan: Ot plan of care cert/re-cert  Muscle weakness - Plan: Ot plan of care cert/re-cert      Subjective Assessment - 10/30/14 0938    Subjective  I never had any therapy after my last stroke   Pertinent History see epic snapshot. Pt has had 8 CVA's from 2003-2013   Patient Stated Goals I want to be able to use my left arm more.  I never got OT after my last stroke   Currently in Pain? Yes   Pain Score 8    Pain Location Arm   Pain Orientation Left   Pain Descriptors / Indicators Aching;Burning;Pins and needles   Pain Type Chronic pain   Pain Radiating Towards radiates down whole arm   Pain Onset More than a month ago  since last stroke in 2013   Pain Frequency Constant   Aggravating Factors  how I lay or sit, or trying to use it   Pain Relieving Factors resting, not moving it or keeping it in a certain position. I use a pillow at night to support it. Pt has no pain medication.    Multiple Pain Sites Yes   Pain Score 9   Pain Location Back   Pain Orientation Left;Right;Posterior   Pain Descriptors / Indicators Burning;Aching;Sore   Pain Type Chronic pain   Pain Onset More than a month ago  pt reports she has had this for over a year   Pain Frequency Constant   Aggravating Factors  nothing makes it worse   Pain Relieving Factors heat helps  PT is addressing lower back pain           [facility] OT Assessment - 10/30/14 0001    Assessment   Diagnosis Multiple CVA's (8)   Onset Date --  2003-2013   Prior Therapy PT for past strokes   Precautions   Precautions Fall   Restrictions   Weight Bearing Restrictions No   Balance Screen   Has the patient fallen in the past 6 months Yes   How many times? 2  Pt is participating in [place] expects to be discharged to: Private residence   Living Arrangements Children  dtr, dtr's fiance and grandchildren   Available Help at Discharge Personal care  attendant  10-12 am.  Family available intermittently   Type of [place] Two level   Bathroom Shower/Tub Tub/Shower unit   Bathroom Toilet --  half bath on first floor   Bathroom Accessibility Yes  pt needs assist on stairs   Additional Comments Pt has no equipment says bathroom is too small and says dtr would not let her.  Pt vascillates between saying she cannot get into bathtub by herself and saying she does get in by herself because she has no choice but does not feel safe. Recommended shower seat that would sit in bath and not take up any room in bathroom and pt says she has grandchildren and this would not work.   Prior Function   Level of Independence Independent   Vocation On disability   Vocation Requirements Pt wa a nurse   ADL   Eating/Feeding Minimal assistance  needs assist for cutting   Grooming --  min a for hair to braid   Upper Body Bathing Minimal assistance  needs help with back only   Lower Body Bathing Modified independent   Upper Body Dressing Independent  pt able to tie shoes   Lower Body Dressing Modified independent   Toilet Tranfer Modified independent   Toileting - Clothing Manipulation Modified independent   Toileting -  Hygiene Independent   Tub/Shower Transfer Supervision/safety   ADL comments Pt facilates between saying she can't get into tub by herself and that she does it because she has no choice and she has to do what she has to do.   IADL   Shopping Assistance for transportation  pt states she doesn't go anywhere but medical appointments   Light Housekeeping Does not participate in any housekeeping tasks  pt states her dtr won't let her   Meal Prep Able to complete simple cold meal and snack prep   Community Mobility --  pt uses medical assistance transportation and family   Medication Management Is responsible for taking medication in correct dosages at correct time   Financial Management Manages  financial matters independently (budgets, writes checks, pays rent, bills goes to bank), collects and keeps track of income   Mobility   Mobility Status History of falls   Written Expression   Dominant Hand Right   Handwriting 100% legible   Vision - History   Baseline Vision Wears glasses all the time   Visual History Glaucome   Additional Comments Pt reports she has had no changes from the strokes but has glaucoma and this has affected perphereal vision   Vision Assessment   Comment Pt denies any visual changes   Activity Tolerance   Activity Tolerance Tolerate 30+ min activity without fatigue   Cognition   Overall Cognitive Status Within Functional Limits for tasks assessed   Sensation   Light Touch Impaired by gross assessment   Hot/Cold Impaired by gross assessment  per pt report   Proprioception Appears Intact   Coordination   Gross Motor Movements are Fluid and Coordinated No  pt moves slowly with both arms, grimacing   9 Hole Peg Test Right;Left   Right 9 Hole Peg Test 32.93   Left 9 Hole Peg Test 38.73   Tone   Assessment Location Left Upper Extremity   ROM / Strength   AROM / PROM / Strength AROM;PROM;Strength   AROM   Overall AROM  Deficits   Overall AROM Comments decrease shoulder flexion to approximately 90*  due to pain however when assessing PROM pt actively raising arm to approximately 110* of shoulder flexion   PROM   Overall PROM  Deficits   Overall PROM Comments shoulder flexion to approximately 130* but pt reports joint pain at 90*   Strength   Overall Strength Deficits   Overall Strength Comments shoulder flexion 4/5 - limited by pain.  Pt needed encouragement to give full effort.    Hand Function   Right Hand Gross Grasp Functional   Right Hand Grip (lbs) 65   Left Hand Gross Grasp Impaired   Left Hand Grip (lbs) 45   LUE Tone   LUE Tone Within Functional Limits                              OT Long Term Goals - 10/30/14 1332     OT LONG TERM GOAL #1   Title Pt will be mod I with HEP - 11/27/2014   Status New   OT LONG TERM GOAL #2   Title Pt will demonstrate improved grip strength by 5 pounds in LUE (baseline = 45 pounds)   Status New   OT LONG TERM GOAL #3   Title Pt will be able to lift 3 pound object off overhead shelf with LUE x3 without dropping   Status New   OT LONG TERM GOAL #4   Title Pt will verbalize understanding of AE for cutting, showering and toileting prn (pt plans to move into own apartment at some point)   Baseline t   Status New   OT LONG TERM GOAL #5   Title Pt will be mod I with simple hot meal prep   Status New               Plan - 10/30/14 1335    Clinical Impression Statement Pt is a 49 year old female s/p multiple CVA's (8 between 2003-2013 per pt) who presents today with the following deficits that impact her ability to complete ADL and IADL tasks:  L non dominant hemiplegia, pain in L arm (long standing), decreased grip strength, decreased AROM, impaired sensation, impaired balance. Pt will benefit from short course of skilled OT to address these deficits and maximize independence.     Pt will benefit from skilled therapeutic intervention in order to improve on the following deficits (Retired) Decreased balance;Decreased range of motion;Decreased mobility;Decreased knowledge of use of DME;Decreased strength;Impaired UE functional use;Impaired flexibility;Impaired sensation;Pain;Impaired perceived functional ability   Rehab Potential Fair   Clinical Impairments Affecting Rehab Potential perceived level of disability   OT Frequency 2x / week   OT Duration 4 weeks   OT Treatment/Interventions Self-care/ADL training;Therapeutic exercise;Neuromuscular education;DME and/or AE instruction;Manual Therapy;Therapist, nutritional;Therapeutic activities;Patient/family education;Balance training   Plan intiate HEP   Consulted and Agree with Plan of Care Patient        Problem  List Patient Active Problem List   Diagnosis Date Noted  . Sacroiliac dysfunction 10/09/2014  . Chronic low back pain 09/03/2014  . CVA, old, hemiparesis ([place]) 09/03/2014  . OSA (obstructive sleep apnea) 05/15/2014  . Restrictive airway disease 05/15/2014  . Colitis 02/22/2014  . Type 2 diabetes, uncontrolled, with renal manifestation ([place]) 01/21/2014  . Essential hypertension, benign 01/21/2014  . Chronic pain disorder 01/21/2014  . Migraine 09/07/2013  . DM type 2, uncontrolled, with neuropathy ([place]) 05/23/2013  . Mixed urge and stress incontinence 08/25/2011  . Ureteral stricture, right 08/25/2011  .  Pyelonephritis 08/22/2011  . Nephrolithiasis 08/22/2011  . Incisional hernia 05/10/2011  . Obesity, Class III, BMI 40-49.9 (morbid obesity) ([place]) 05/10/2011  . Tachycardia 05/08/2011  . DIABETES MELLITUS, TYPE II 09/30/2006  . BLINDNESS, [place] DEFINITION 09/14/2006  . HYPERLIPIDEMIA 09/13/2006  . Anxiety and depression 09/13/2006  . Essential hypertension 09/13/2006  . MYOCARDIAL INFARCTION, HX OF 09/13/2006  . GERD 09/13/2006  . SEIZURE DISORDER 09/13/2006    Quay Burow, OTR/L 10/30/2014, 1:49 PM  [facility] [address] [place], [place], [address] Phone: [phone]   Fax:  [phone]

## 2014-10-30 NOTE — Therapy (Signed)
[facility] [address] [place], [place], [address] Phone: [phone]   Fax:  [phone]  Physical Therapy Treatment  Patient Details  Name: [name] MRN: [id number] Date of Birth: 1965-06-05 Referring Provider:  Lauree Chandler, NP  Encounter Date: 10/30/2014      PT End of Session - 10/30/14 1015    Visit Number 4   Number of Visits 17   Date for PT Re-Evaluation 12/16/14   Authorization Type UHC MCR, MCD secondary, G codes due to every 10th visit   PT Start Time 1017   PT Stop Time 1100   PT Time Calculation (min) 43 min   Activity Tolerance Patient tolerated treatment well   Behavior During Therapy [facility] for tasks assessed/performed      Past Medical History  Diagnosis Date  . Hypertension   . Asthma   . Seizures ([place])   . Heart murmur     born with   . Dysrhythmia     skipped beats  . Myocardial infarction ([place])     hi of x 2  last one 2003   . COPD (chronic obstructive pulmonary disease) (HCC)     chronic bronchitis   . Pneumonia     hx of 2009  . GERD (gastroesophageal reflux disease)   . Headache(784.0)     migraines   . Neuropathy ([place])     due to diabetes   . Dizziness     secondary to diabetes and hypertension   . Arthritis     joint pain   . Hx of blood clots     hematomas removed from left side of brain from 70mo to 89yrold   . Epilepsy idiopathic petit mal ([place]    last seizure 2012;controlled w/ topomax  . Diabetic neuropathy, painful ([place]    FEET AND HANDS  . Diabetes mellitus     since age [age]type 2 IDDM  . Sleep apnea     [facility];does [facility] ; mild  . MIGRAINE HEADACHE 09/14/2006    Qualifier: Diagnosis of  By: MaHassell DoneNP, NyTori Milks  . Glaucoma     NOT ON ANY EYE DROPS   . Legally blind     left eye   . [facility]    [facility]  RESIDUAL LEFT LEG WEAKNESS--NO OTHER RESIDUAL PROBLEMS  . CVA 11/14/2007    Qualifier:  Diagnosis of  By: MaHassell DoneNP, NyTori Milks  . Pain 09/23/11    LOWER BACK AND RIGHT SIDE--PT HAS RIGHT URETERAL STONE  . Nephrolithiasis     frequent urination , urination at nite  PT SEEN IN ER 09/22/11 FOR BACK AND RT SIDED PAIN--HAS KNOWN STONE RT URETER AND UA IN ER SHOWED UTI  . Anxiety   . Depression   . Stress incontinence   . Hyperlipidemia   . Pyelonephritis     Past Surgical History  Procedure Laterality Date  . Kidney stone removal    . Brain hematoma evacuation      five procedures total, first procedure when 49months old, last at 49 years of age.  . Shunt removal      shunt inserted at age 49 removed at age 49 . Eye surgery    . Retinal detachment surgery  1990  . Vaginal hysterectomy  1996  . Incisional hernia repair  05/05/2011    Procedure: LAPAROSCOPIC INCISIONAL HERNIA;  Surgeon: Rolm Bookbinder, MD;  Location: [place];  Service: General;  Laterality: N/A;  . Mass excision  05/05/2011    Procedure: EXCISION MASS;  Surgeon: Rolm Bookbinder, MD;  Location: [facility];  Service: General;  Laterality: Right;  . Hernia repair  05/05/11    lap incisional hernia  . Pcnl    . Cystoscopy/retrograde/ureteroscopy  08/24/2011    Procedure: CYSTOSCOPY/RETROGRADE/URETEROSCOPY;  Surgeon: Molli Hazard, MD;  [facility];  Service: Urology;  Laterality: Right;  Cysto, Right retrograde Pyelogram, right stent placement.   . Cystoscopy with ureteroscopy  09/24/2011    Procedure: CYSTOSCOPY WITH URETEROSCOPY;  Surgeon: Malka So, MD;  [facility];  Service: Urology;  Laterality: Right;  Balloon dilation right ureter   . Cystoscopy w/ retrogrades  09/24/2011    Procedure: CYSTOSCOPY WITH RETROGRADE PYELOGRAM;  Surgeon: Malka So, MD;  [facility];  Service: Urology;  Laterality: Right;    There were no vitals filed for this visit.  Visit Diagnosis:  Abnormality of gait  Unsteadiness  Generalized weakness  Bilateral low back pain, with sciatica presence  unspecified      Subjective Assessment - 10/30/14 1022    Subjective No falls or issues since last PT appt. She has been doing her exercises without issues.   Currently in Pain? Yes   Pain Score 8   In last week, best 7/10, worst 10/10   Pain Location Back   Pain Orientation Lower;Mid   Pain Descriptors / Indicators Aching;Burning   Pain Type Chronic pain   Pain Onset More than a month ago   Pain Frequency Constant   Aggravating Factors  walk longer and sitting positions   Pain Relieving Factors sitting with pillow,      Neuromuscular Re-education: tactile & verbal cues on posture and core stabilization for lumbar region for the following activities -  Supine: posterior pelvic tilt (ppt) with cues on breathing pattern, ppt with hooklying hip abd/adduction, ppt with heel slides, ppt with hip flexion, ppt with alternate UE flexion to overhead. Sitting on firm chair with LE support: pelvic rocking - anterior tilt arching back and ppt with cues on upper body posture and breath control, alternating stepping forward / knee extension with core stabilization, alternate stepping to side / hip abduction with core stabilization, alternate overhead reach with core stabilization Standing with back on door frame: pelvic tilts with breath control, cone tapping with core stabilization, stepping forward to hip hyperextension (terminal stance position) with core stabilization, alternate overhead reaching with core stabilization Sitting on Swiss ball with chair backs to side and mat table behind for safety & confidence: anterior / posterior roll, side to side roll, circles, light bouncing, alternate stepping forward / knee extension with core stabilization, trunk rotation to look over shoulders Gait with single point cane: Patient ambulated 400' with tactile, verbal cues on core stabilization and movement pattern to look right & left.                               PT Short Term Goals  - 10/17/14 1036    PT SHORT TERM GOAL #1   Title Pt will initiate HEP for balance and strengthening to increase functional strength and decrease fall risk.  (Target date: 11/14/14)   Baseline Pt is dependent with HEP.    Time 4   Period Weeks   PT SHORT TERM GOAL #2   Title Pt will  increase FGA to 16/30 in order to indicate functional improvement in balance and decreased fall risk. (Target date: 11/14/14)   Baseline 11/30 on 10/17/14   Time 4   Period Weeks   PT SHORT TERM GOAL #3   Title Pt will increase gait speed to 2.30 ft/sec in order to indicate decreased fall risk and improved efficiency of gait.  (Target date: 11/14/14)   Baseline 1.70 ft/sec on 10/17/14   Time 4   Period Weeks   PT SHORT TERM GOAL #4   Title Pt will verbalize CVA warning signs and risk factors independently in order to ensure pt understanding of when to seek medical care.  (Target date: 11/14/14)   Baseline Pt dependent at this time   Time 4   Period Weeks   PT SHORT TERM GOAL #5   Title Pt will ambulate >300' with LRAD at S level negotiating around and over obstacles without overt LOB to indicate safe home negotiation.    Baseline Min/guard to min A for 200' due to safety concerns and LOB.    Time 4   Period Weeks           PT Long Term Goals - 10/17/14 1041    PT LONG TERM GOAL #1   Title Pt will be independent with HEP to indicate compliance and increased strengthening and decreased fall risk.  (Target Date: 12/12/14)   Baseline Pt dependent with HEP   Time 8   Period Weeks   PT LONG TERM GOAL #2   Title Pt will increase FGA score to 21/30 in order to indicate decreased fall risk and improved balance.  (Target Date: 12/12/14)   Baseline 11/30 on 10/17/14   Time 8   Period Weeks   PT LONG TERM GOAL #3   Title Pt will increase gait speed to 2.90 ft/sec in order to indicate decreased fall risk, return to community ambulation, and increased efficiency of gait. (Target Date: 12/12/14)   Baseline 1.70  ft/sec on 10/17/14   Time 8   Period Weeks   PT LONG TERM GOAL #4   Title Pt will ambulate >300' on outdoor surfaces including grass, curb and ramp without AD at mod I level in order to safely traverse in community.  (Target Date: 12/12/14)   Baseline Min/guard to min A over indoor surfaces   Time 8   Period Weeks   PT LONG TERM GOAL #5   Title Pt will increase [place] Impact Scale score to 56.8% to indicate improved pt perceived functional mobility status.  (Target Date: 12/12/14)   Baseline 41.7% on 10/17/14   Time 8   Period Weeks               Plan - 10/30/14 1015    Clinical Impression Statement Patient reported back pain at 8/10 at beginning of session and 6/10 at end of session with focus on core stabilization with activities. Patient needs additional work on posture and core stabilization while performing activities.    Pt will benefit from skilled therapeutic intervention in order to improve on the following deficits Abnormal gait;Decreased activity tolerance;Decreased balance;Decreased coordination;Decreased endurance;Decreased knowledge of precautions;Decreased knowledge of use of DME;Decreased mobility;Decreased safety awareness;Decreased strength;Difficulty walking;Impaired perceived functional ability;Impaired sensation;Decreased range of motion;Impaired UE functional use;Impaired vision/preception;Improper body mechanics;Postural dysfunction;Pain   Rehab Potential Good   Clinical Impairments Affecting Rehab Potential pt compliance with recommendations   PT Frequency 2x / week   PT Duration 8 weeks   PT Treatment/Interventions ADLs/Self Care  Home Management;Electrical Stimulation;DME Instruction;Gait training;Stair training;Functional mobility training;Therapeutic activities;Therapeutic exercise;Balance training;Neuromuscular re-education;Patient/family education;Orthotic Fit/Training;Visual/perceptual remediation/compensation   PT Next Visit Plan Follow up on living  situation, go over HEP for compliance, fall prevention, CVA warning signs.    Consulted and Agree with Plan of Care Patient        Problem List Patient Active Problem List   Diagnosis Date Noted  . Sacroiliac dysfunction 10/09/2014  . Chronic low back pain 09/03/2014  . CVA, old, hemiparesis ([place]) 09/03/2014  . OSA (obstructive sleep apnea) 05/15/2014  . Restrictive airway disease 05/15/2014  . Colitis 02/22/2014  . Type 2 diabetes, uncontrolled, with renal manifestation ([place]) 01/21/2014  . Essential hypertension, benign 01/21/2014  . Chronic pain disorder 01/21/2014  . Migraine 09/07/2013  . DM type 2, uncontrolled, with neuropathy ([place]) 05/23/2013  . Mixed urge and stress incontinence 08/25/2011  . Ureteral stricture, right 08/25/2011  . Pyelonephritis 08/22/2011  . Nephrolithiasis 08/22/2011  . Incisional hernia 05/10/2011  . Obesity, Class III, BMI 40-49.9 (morbid obesity) ([place]) 05/10/2011  . Tachycardia 05/08/2011  . DIABETES MELLITUS, TYPE II 09/30/2006  . BLINDNESS, [place], [place] DEFINITION 09/14/2006  . HYPERLIPIDEMIA 09/13/2006  . Anxiety and depression 09/13/2006  . Essential hypertension 09/13/2006  . MYOCARDIAL INFARCTION, HX OF 09/13/2006  . GERD 09/13/2006  . SEIZURE DISORDER 09/13/2006    Jamey Reas PT, DPT 10/30/2014, 12:19 PM  [facility] [address] [place], [place], [address] Phone: [phone]   Fax:  [phone]

## 2014-11-01 ENCOUNTER — Encounter: Payer: Self-pay | Admitting: Physical Therapy

## 2014-11-01 ENCOUNTER — Ambulatory Visit: Payer: Medicare Other | Admitting: Physical Therapy

## 2014-11-01 DIAGNOSIS — R269 Unspecified abnormalities of gait and mobility: Secondary | ICD-10-CM | POA: Diagnosis not present

## 2014-11-01 DIAGNOSIS — R531 Weakness: Secondary | ICD-10-CM

## 2014-11-01 DIAGNOSIS — M545 Low back pain: Secondary | ICD-10-CM

## 2014-11-01 DIAGNOSIS — R2681 Unsteadiness on feet: Secondary | ICD-10-CM

## 2014-11-01 DIAGNOSIS — M62830 Muscle spasm of back: Secondary | ICD-10-CM

## 2014-11-01 DIAGNOSIS — M6281 Muscle weakness (generalized): Secondary | ICD-10-CM

## 2014-11-01 NOTE — Therapy (Signed)
[name] [address] [place], [place], [address] Phone: [phone]   Fax:  [phone]  Physical Therapy Treatment  Patient Details  Name: [name] MRN: [id number] Date of Birth: October 06, 1965 Referring Provider:  Lauree Chandler, NP  Encounter Date: 11/01/2014      PT End of Session - 11/01/14 1026    Visit Number 5   Number of Visits 17   Date for PT Re-Evaluation 12/16/14   Authorization Type UHC MCR, MCD secondary, G codes due to every 10th visit   PT Start Time 1017   PT Stop Time 1100   PT Time Calculation (min) 43 min   Activity Tolerance Patient tolerated treatment well   Behavior During Therapy [facility] for tasks assessed/performed      Past Medical History  Diagnosis Date  . Hypertension   . Asthma   . Seizures ([place])   . Heart murmur     born with   . Dysrhythmia     skipped beats  . Myocardial infarction ([place])     hi of x 2  last one 2003   . COPD (chronic obstructive pulmonary disease) (H[facility])     chronic bronchitis   . Pneumonia     hx of 2009  . GERD (gastroesophageal reflux disease)   . Headache(784.0)     migraines   . Neuropathy ([place])     due to diabetes   . Dizziness     secondary to diabetes and hypertension   . Arthritis     joint pain   . Hx of blood clots     hematomas removed from left side of brain from 15mo to 89yrold   . Epilepsy idiopathic petit mal ([place]    last seizure 2012;controlled w/ topomax  . Diabetic neuropathy, painful ([facility]    FEET AND HANDS  . Diabetes mellitus     since age [age]type 2 IDDM  . Sleep ap[facility]     [facility] not [facility] ; mild  . MIGRAINE HEADACHE 09/14/2006    Qualifier: Diagnosis of  By: MaHassell DoneNP, NyTori Milks  . Glaucoma     NOT ON ANY EYE DROPS   . Legally blind     left eye   . [facility]    last 2003  RESIDUAL LEFT LEG WEAKNESS--NO OTHER RESIDUAL PROBLEMS  . CVA 11/14/2007    Qualifier:  Diagnosis of  By: MaHassell DoneNP, NyTori Milks  . Pain 09/23/11    LOWER BACK AND RIGHT SIDE--PT HAS RIGHT URETERAL STONE  . Nephrolithiasis     frequent urination , urination at nite  PT SEEN IN ER 09/22/11 FOR BACK AND RT SIDED PAIN--HAS KNOWN STONE RT URETER AND UA IN ER SHOWED UTI  . Anxiety   . Depression   . Stress incontinence   . Hyperlipidemia   . Pyelonephritis     Past Surgical History  Procedure Laterality Date  . Kidney stone removal    . Brain hematoma evacuation      five procedures total, first procedure when [age]onths old, last at [age]ears of age.  . Shunt removal      shunt inserted at age [age]emoved at age 49 . Eye surgery    . Retinal detachment surgery  1990  . Vaginal hysterectomy  1996  . Incisional hernia repair  05/05/2011    Procedure: LAPAROSCOPIC INCISIONAL HERNIA;  Surgeon: Rolm Bookbinder, MD;  Location: [place];  Service: General;  Laterality: N/A;  . Mass excision  05/05/2011    Procedure: EXCISION MASS;  Surgeon: Rolm Bookbinder, MD;  Location: [place];  Service: General;  Laterality: Right;  . Hernia repair  05/05/11    lap incisional hernia  . Pcnl    . Cystoscopy/retrograde/ureteroscopy  08/24/2011    Procedure: CYSTOSCOPY/RETROGRADE/URETEROSCOPY;  Surgeon: Molli Hazard, MD;  Location[facility];  Service: Urology;  Laterality: Right;  Cysto, Right retrograde Pyelogram, right stent placement.   . Cystoscopy with ureteroscopy  09/24/2011    Procedure: CYSTOSCOPY WITH URETEROSCOPY;  Surgeon: Malka So, MD;  Location[facility];  Service: Urology;  Laterality: Right;  Balloon dilation right ureter   . Cystoscopy w/ retrogrades  09/24/2011    Procedure: CYSTOSCOPY WITH RETROGRADE PYELOGRAM;  Surgeon: Malka So, MD;  Location[facility];  Service: Urology;  Laterality: Right;    There were no vitals filed for this visit.  Visit Diagnosis:  Unsteadiness  Generalized weakness  Bilateral low back pain, with sciatica presence unspecified  Muscle  weakness  Muscle spasm of back      Subjective Assessment - 11/01/14 1024    Currently in Pain? Yes   Pain Score 7    Pain Location Back   Pain Orientation Lower;Mid   Pain Descriptors / Indicators Aching;Sore;Burning   Pain Type Chronic pain   Pain Onset More than a month ago   Aggravating Factors  increased activity, prolonged sitting or immobility   Pain Relieving Factors soaking in hot bath, ibuprofen     Treatment: Exercises: Posterior pelvic tilt, 5 sec hold x 10 reps Bridge 3 sec hold x 10 reps With red pball under legs, bridge 5 sec hold x 10 reps With red pball under legs, lower trunk rotation left<>right x 10 reps each way Hook lying: alternating marching x 10 each leg Table top hold, 10 sec's x 5 reps Table top position, alternating heel taps to mat x 10 reps each leg, then 2 sets of 5 reps due to fatigue limited full 10 straight          PT Education - 11/02/14 1004    Education provided Yes   Education Details Fall prevention strategies, CVA risk factors/warning signs. Primary PT followed up on home enviroment, pt reports taking steps to improve her home situation (stayed at boyfriends one night, possibly moving out).    Person(s) Educated Patient   Methods Explanation;Demonstration;Handout   Comprehension Verbalized understanding;Returned demonstration          PT Short Term Goals - 10/17/14 1036    PT SHORT TERM GOAL #1   Title Pt will initiate HEP for balance and strengthening to increase functional strength and decrease fall risk.  (Target date: 11/14/14)   Baseline Pt is dependent with HEP.    Time 4   Period Weeks   PT SHORT TERM GOAL #2   Title Pt will increase FGA to 16/30 in order to indicate functional improvement in balance and decreased fall risk. (Target date: 11/14/14)   Baseline 11/30 on 10/17/14   Time 4   Period Weeks   PT SHORT TERM GOAL #3   Title Pt will increase gait speed to 2.30 ft/sec in order to indicate decreased fall risk  and improved efficiency of gait.  (Target date: 11/14/14)   Baseline 1.70 ft/sec on 10/17/14   Time 4   Period Weeks   PT SHORT TERM GOAL #4  Title Pt will verbalize CVA warning signs and risk factors independently in order to ensure pt understanding of when to seek medical care.  (Target date: 11/14/14)   Baseline Pt dependent at this time   Time 4   Period Weeks   PT SHORT TERM GOAL #5   Title Pt will ambulate >300' with LRAD at S level negotiating around and over obstacles without overt LOB to indicate safe home negotiation.    Baseline Min/guard to min A for 200' due to safety concerns and LOB.    Time 4   Period Weeks           PT Long Term Goals - 10/17/14 1041    PT LONG TERM GOAL #1   Title Pt will be independent with HEP to indicate compliance and increased strengthening and decreased fall risk.  (Target Date: 12/12/14)   Baseline Pt dependent with HEP   Time 8   Period Weeks   PT LONG TERM GOAL #2   Title Pt will increase FGA score to 21/30 in order to indicate decreased fall risk and improved balance.  (Target Date: 12/12/14)   Baseline 11/30 on 10/17/14   Time 8   Period Weeks   PT LONG TERM GOAL #3   Title Pt will increase gait speed to 2.90 ft/sec in order to indicate decreased fall risk, return to community ambulation, and increased efficiency of gait. (Target Date: 12/12/14)   Baseline 1.70 ft/sec on 10/17/14   Time 8   Period Weeks   PT LONG TERM GOAL #4   Title Pt will ambulate >300' on outdoor surfaces including grass, curb and ramp without AD at mod I level in order to safely traverse in community.  (Target Date: 12/12/14)   Baseline Min/guard to min A over indoor surfaces   Time 8   Period Weeks   PT LONG TERM GOAL #5   Title Pt will increase [place] Impact Scale score to 56.8% to indicate improved pt perceived functional mobility status.  (Target Date: 12/12/14)   Baseline 41.7% on 10/17/14   Time 8   Period Weeks           Plan - 11/01/14 1026     Clinical Impression Statement Continued to focus on core and lower extremity strengthening and core stability today with exercises. Pt reported feeling challenged with new exercises, however no increase in pain was reported. Educated pt on and provided information on fall prevention strategies and CVA risk factors/warning signs.  Pt making steady progress toward goals.    Pt will benefit from skilled therapeutic intervention in order to improve on the following deficits Abnormal gait;Decreased activity tolerance;Decreased balance;Decreased coordination;Decreased endurance;Decreased knowledge of precautions;Decreased knowledge of use of DME;Decreased mobility;Decreased safety awareness;Decreased strength;Difficulty walking;Impaired perceived functional ability;Impaired sensation;Decreased range of motion;Impaired UE functional use;Impaired vision/preception;Improper body mechanics;Postural dysfunction;Pain   Rehab Potential Good   Clinical Impairments Affecting Rehab Potential pt compliance with recommendations   PT Frequency 2x / week   PT Duration 8 weeks   PT Treatment/Interventions ADLs/Self Care Home Management;Electrical Stimulation;DME Instruction;Gait training;Stair training;Functional mobility training;Therapeutic activities;Therapeutic exercise;Balance training;Neuromuscular re-education;Patient/family education;Orthotic Fit/Training;Visual/perceptual remediation/compensation   PT Next Visit Plan continue with strengthening and balance toward goals;continue to monitor and follow up on home enviroment/living situation   Consulted and Agree with Plan of Care Patient        Problem List Patient Active Problem List   Diagnosis Date Noted  . Sacroiliac dysfunction 10/09/2014  . Chronic low back pain 09/03/2014  .  CVA, old, hemiparesis ([place]) 09/03/2014  . OSA (obstructive sleep apnea) 05/15/2014  . Restrictive airway disease 05/15/2014  . Colitis 02/22/2014  . Type 2 diabetes, uncontrolled,  with renal manifestation ([place]) 01/21/2014  . Essential hypertension, benign 01/21/2014  . Chronic pain disorder 01/21/2014  . Migraine 09/07/2013  . DM type 2, uncontrolled, with neuropathy ([place]) 05/23/2013  . Mixed urge and stress incontinence 08/25/2011  . Ureteral stricture, right 08/25/2011  . Pyelonephritis 08/22/2011  . Nephrolithiasis 08/22/2011  . Incisional hernia 05/10/2011  . Obesity, Class III, BMI 40-49.9 (morbid obesity) ([place]) 05/10/2011  . Tachycardia 05/08/2011  . DIABETES MELLITUS, TYPE II 09/30/2006  . BLINDNESS, [place], [place] DEFINITION 09/14/2006  . HYPERLIPIDEMIA 09/13/2006  . Anxiety and depression 09/13/2006  . Essential hypertension 09/13/2006  . MYOCARDIAL INFARCTION, HX OF 09/13/2006  . GERD 09/13/2006  . SEIZURE DISORDER 09/13/2006    Willow Ora 11/02/2014, 10:06 AM  Willow Ora, PTA, [facility] Outpatient Neuro [facility] [address] [place], [place] [address] [phone] 11/02/2014, 10:07 AM

## 2014-11-01 NOTE — Patient Instructions (Signed)
Fall Prevention in the Home  Falls can cause injuries and can affect people from all age groups. There are many simple things that you can do to make your home safe and to help prevent falls. WHAT CAN I DO ON THE OUTSIDE OF MY HOME?  Regularly repair the edges of walkways and driveways and fix any cracks.  Remove high doorway thresholds.  Trim any shrubbery on the main path into your home.  Use bright outdoor lighting.  Clear walkways of debris and clutter, including tools and rocks.  Regularly check that handrails are securely fastened and in good repair. Both sides of any steps should have handrails.  Install guardrails along the edges of any raised decks or porches.  Have leaves, snow, and ice cleared regularly.  Use sand or salt on walkways during winter months.  In the garage, clean up any spills right away, including grease or oil spills. WHAT CAN I DO IN THE BATHROOM?  Use night lights.  Install grab bars by the toilet and in the tub and shower. Do not use towel bars as grab bars.  Use non-skid mats or decals on the floor of the tub or shower.  If you need to sit down while you are in the shower, use a plastic, non-slip stool.[name]  Keep the floor dry. Immediately clean up any water that spills on the floor.  Remove soap buildup in the tub or shower on a regular basis.  Attach bath mats securely with double-sided non-slip rug tape.  Remove throw rugs and other tripping hazards from the floor. WHAT CAN I DO IN THE BEDROOM?  Use night lights.  Make sure that a bedside light is easy to reach.  Do not use oversized bedding that drapes onto the floor.  Have a firm chair that has side arms to use for getting dressed.  Remove throw rugs and other tripping hazards from the floor. WHAT CAN I DO IN THE KITCHEN?   Clean up any spills right away.  Avoid walking on wet floors.  Place frequently used items in easy-to-reach places.  If you need to reach for something  above you, use a sturdy step stool that has a grab bar.  Keep electrical cables out of the way.  Do not use floor polish or wax that makes floors slippery. If you have to use wax, make sure that it is non-skid floor wax.  Remove throw rugs and other tripping hazards from the floor. WHAT CAN I DO IN THE STAIRWAYS?  Do not leave any items on the stairs.  Make sure that there are handrails on both sides of the stairs. Fix handrails that are broken or loose. Make sure that handrails are as long as the stairways.  Check any carpeting to make sure that it is firmly attached to the stairs. Fix any carpet that is loose or worn.  Avoid having throw rugs at the top or bottom of stairways, or secure the rugs with carpet tape to prevent them from moving.  Make sure that you have a light switch at the top of the stairs and the bottom of the stairs. If you do not have them, have them installed. WHAT ARE SOME OTHER FALL PREVENTION TIPS?  Wear closed-toe shoes that fit well and support your feet. Wear shoes that have rubber soles or low heels.  When you use a stepladder, make sure that it is completely opened and that the sides are firmly locked. Have someone hold the ladder while you  are using it. Do not climb a closed stepladder.  Add color or contrast paint or tape to grab bars and handrails in your home. Place contrasting color strips on the first and last steps.  Use mobility aids as needed, such as canes, walkers, scooters, and crutches.  Turn on lights if it is dark. Replace any light bulbs that burn out.  Set up furniture so that there are clear paths. Keep the furniture in the same spot.  Fix any uneven floor surfaces.  Choose a carpet design that does not hide the edge of steps of a stairway.  Be aware of any and all pets.  Review your medicines with your healthcare provider. Some medicines can cause dizziness or changes in blood pressure, which increase your risk of falling. Talk  with your health care provider about other ways that you can decrease your risk of falls. This may include working with a physical therapist or trainer to improve your strength, balance, and endurance.   This information is not intended to replace advice given to you by your health care provider. Make sure you discuss any questions you have with your health care provider.   Document Released: 12/26/2001 Document Revised: 05/22/2014 Document Reviewed: 02/09/2014 Elsevier Interactive Patient Education 2016 [employer]. Stroke Prevention Some medical conditions and behaviors are associated with an increased chance of having a stroke. You may prevent a stroke by making healthy choices and managing medical conditions. HOW CAN I REDUCE MY RISK OF HAVING A STROKE?   Stay physically active. Get at least 30 minutes of activity on most or all days.  Do not smoke. It may also be helpful to avoid exposure to secondhand smoke.  Limit alcohol use. Moderate alcohol use is considered to be:  No more than 2 drinks per day for men.  No more than 1 drink per day for nonpregnant women.  Eat healthy foods. This involves:  Eating 5 or more servings of fruits and vegetables a day.  Making dietary changes that address high blood pressure (hypertension), high cholesterol, diabetes, or obesity.  Manage your cholesterol levels.  Making food choices that are high in fiber and low in saturated fat, trans fat, and cholesterol may control cholesterol levels.  Take any prescribed medicines to control cholesterol as directed by your health care provider.  Manage your diabetes.  Controlling your carbohydrate and sugar intake is recommended to manage diabetes.  Take any prescribed medicines to control diabetes as directed by your health care provider.  Control your hypertension.  Making food choices that are low in salt (sodium), saturated fat, trans fat, and cholesterol is recommended to manage  hypertension.  Ask your health care provider if you need treatment to lower your blood pressure. Take any prescribed medicines to control hypertension as directed by your health care provider.  If you are 6-17 years of age, have your blood pressure checked every 3-5 years. If you are 61 years of age or older, have your blood pressure checked every year.  Maintain a healthy weight.  Reducing calorie intake and making food choices that are low in sodium, saturated fat, trans fat, and cholesterol are recommended to manage weight.  Stop drug abuse.  Avoid taking birth control pills.  Talk to your health care provider about the risks of taking birth control pills if you are over 23 years old, smoke, get migraines, or have ever had a blood clot.  Get evaluated for sleep disorders (sleep apnea).  Talk to your health care  provider about getting a sleep evaluation if you snore a lot or have excessive sleepiness.  Take medicines only as directed by your health care provider.  For some people, aspirin or blood thinners (anticoagulants) are helpful in reducing the risk of forming abnormal blood clots that can lead to stroke. If you have the irregular heart rhythm of atrial fibrillation, you should be on a blood thinner unless there is a good reason you cannot take them.  Understand all your medicine instructions.  Make sure that other conditions (such as anemia or atherosclerosis) are addressed. SEEK IMMEDIATE MEDICAL CARE IF:   You have sudden weakness or numbness of the face, arm, or leg, especially on one side of the body.  Your face or eyelid droops to one side.  You have sudden confusion.  You have trouble speaking (aphasia) or understanding.  You have sudden trouble seeing in one or both eyes.  You have sudden trouble walking.  You have dizziness.  You have a loss of balance or coordination.  You have a sudden, severe headache with no known cause.  You have new chest pain or  an irregular heartbeat. Any of these symptoms may represent a serious problem that is an emergency. Do not wait to see if the symptoms will go away. Get medical help at once. Call your local emergency services (911 in [place] Do not drive yourself to the hospital.   This information is not intended to replace advice given to you by your health care provider. Make sure you discuss any questions you have with your health care provider.   Document Released: 02/13/2004 Document Revised: 01/26/2014 Document Reviewed: 07/08/2012 Elsevier Interactive Patient Education [employer].

## 2014-11-05 ENCOUNTER — Ambulatory Visit: Payer: Medicare Other | Admitting: Occupational Therapy

## 2014-11-05 ENCOUNTER — Other Ambulatory Visit: Payer: Self-pay | Admitting: *Deleted

## 2014-11-05 MED ORDER — GLUCOSE BLOOD VI STRP: ORAL_STRIP | Status: DC

## 2014-11-05 MED ORDER — LANCETS ULTRA FINE MISC
Status: DC
Start: 2014-11-05 — End: 2014-11-20

## 2014-11-05 MED ORDER — ONETOUCH ULTRA SYSTEM W/DEVICE KIT
1.0000 | PACK | Freq: Once | Status: DC
Start: 2014-11-05 — End: 2014-11-20

## 2014-11-05 NOTE — Telephone Encounter (Signed)
Patient requested new blood sugar machine because she lost hers.

## 2014-11-06 ENCOUNTER — Ambulatory Visit: Payer: Self-pay | Admitting: Physical Therapy

## 2014-11-07 ENCOUNTER — Ambulatory Visit: Payer: Medicare Other | Admitting: Physical Therapy

## 2014-11-09 ENCOUNTER — Other Ambulatory Visit: Payer: Self-pay | Admitting: Nurse Practitioner

## 2014-11-09 ENCOUNTER — Other Ambulatory Visit: Payer: Self-pay | Admitting: Internal Medicine

## 2014-11-12 ENCOUNTER — Encounter: Payer: Self-pay | Admitting: Occupational Therapy

## 2014-11-12 ENCOUNTER — Ambulatory Visit: Payer: Medicare Other | Admitting: Rehabilitation

## 2014-11-12 ENCOUNTER — Ambulatory Visit: Payer: Medicare Other | Admitting: Occupational Therapy

## 2014-11-12 ENCOUNTER — Encounter: Payer: Self-pay | Admitting: Physical Therapy

## 2014-11-12 DIAGNOSIS — R269 Unspecified abnormalities of gait and mobility: Secondary | ICD-10-CM | POA: Diagnosis not present

## 2014-11-12 DIAGNOSIS — G8194 Hemiplegia, unspecified affecting left nondominant side: Secondary | ICD-10-CM

## 2014-11-12 DIAGNOSIS — R2681 Unsteadiness on feet: Secondary | ICD-10-CM

## 2014-11-12 DIAGNOSIS — I69952 Hemiplegia and hemiparesis following unspecified cerebrovascular disease affecting left dominant side: Secondary | ICD-10-CM

## 2014-11-12 DIAGNOSIS — R531 Weakness: Secondary | ICD-10-CM

## 2014-11-12 NOTE — Patient Instructions (Signed)
  Coordination Activities  Perform the following activities for 10-15 minutes 1-2 times per day with left hand(s).   Rotate ball in fingertips (clockwise and counter-clockwise).  Toss ball in air and catch with the same hand.  Flip cards 1 at a time as fast as you can.  Deal cards with your thumb (Hold deck in hand and push card off top with thumb).  Rotate card in hand (clockwise and counter-clockwise).  Pick up coins one at a time until you get 5-10 in your hand, then move coins from palm to fingertips to stack one at a time.           PUTTY EXERCISES  1. Grip Strengthening (Resistive Putty)   Squeeze putty using thumb and all fingers. Repeat _20___ times. Do __2__ sessions per day.   2. Roll putty into tube on table and pinch between each finger and thumb x 10 reps each. (can do ring and small finger together)                 SHOULDER EXERCISES   1. SELF ASSISTED WITH OBJECT: Shoulder Flexion - Sitting    Hold cane with both hands. Raise arms up overhead keeping elbows straight. _10__ reps per set, _2-3__ sets per day   2. **Then, hold 2-3 lb. dumbbell in hand, palms facing each other, and raise weight overhead (elbows straight) lying down, then to eye level sitting up. Repeat 10 times each position, 2-3 times per day

## 2014-11-12 NOTE — Therapy (Signed)
[facility] [address] [place], [place], [address] Phone: [phone]   Fax:  [phone]  Occupational Therapy Treatment  Patient Details  Name: [name] MRN: [id number] Date of Birth: [birth year] No Data Recorded  Encounter Date: 11/12/2014      OT End of Session - 11/12/14 1023    Visit Number 2   Number of Visits 8   Date for OT Re-Evaluation 12/25/14   Authorization Type medicare - will need g code   Authorization Time Period 60 days   OT Start Time 0930   OT Stop Time 1015   OT Time Calculation (min) 45 min   Activity Tolerance Patient tolerated treatment well      Past Medical History  Diagnosis Date  . Hypertension   . Asthma   . Seizures ([place])   . Heart murmur     born with   . Dysrhythmia     skipped beats  . Myocardial infarction ([place])     hi of x 2  last one 2003   . COPD (chronic obstructive pulmonary disease) (HCC)     chronic bronchitis   . Pneumonia     hx of 2009  . GERD (gastroesophageal reflux disease)   . Headache(784.0)     migraines   . Neuropathy ([place])     due to diabetes   . Dizziness     secondary to diabetes and hypertension   . Arthritis     joint pain   . Hx of blood clots     hematomas removed from left side of brain from 28mo to 89yrold   . Epilepsy idiopathic petit mal ([facility]    last seizure 2012;controlled w/ topomax  . Diabetic neuropathy, painful ([facility]    FEET AND HANDS  . Diabetes mellitus     since age [age]type 2 IDDM  . Sleep apnea     [facility];does not use Cpap ; mild  . MIGRAINE HEADACHE 09/14/2006    Qualifier: Diagnosis of  By: MaHassell DoneNP, NyTori Milks  . Glaucoma     NOT ON ANY EYE DROPS   . Legally blind     left eye   . [facility]    last 2003  RESIDUAL LEFT LEG WEAKNESS--NO OTHER RESIDUAL PROBLEMS  . CVA 11/14/2007    Qualifier: Diagnosis of  By: MaHassell DoneNP, NyTori Milks  . Pain 09/23/11    LOWER BACK AND  RIGHT SIDE--PT HAS RIGHT URETERAL STONE  . Nephrolithiasis     frequent urination , urination at nite  PT SEEN IN ER 09/22/11 FOR BACK AND RT SIDED PAIN--HAS KNOWN STONE RT URETER AND UA IN ER SHOWED UTI  . Anxiety   . Depression   . Stress incontinence   . Hyperlipidemia   . Pyelonephritis     Past Surgical History  Procedure Laterality Date  . Kidney stone removal    . Brain hematoma evacuation      five procedures total, first procedure when 89months old, last at [age]ears of age.  . Shunt removal      shunt inserted at age [age]emoved at age 49 . Eye surgery    . Retinal detachment surgery  1990  . Vaginal hysterectomy  1996  . Incisional hernia repair  05/05/2011    Procedure: LAPAROSCOPIC INCISIONAL HERNIA;  Surgeon: MaRolm BookbinderMD;  [facility] Service: General;  Laterality: N/A;  . Mass excision  05/05/2011    Procedure: EXCISION MASS;  Surgeon: Rolm Bookbinder, MD;  Location: [place];  Service: General;  Laterality: Right;  . Hernia repair  05/05/11    lap incisional hernia  . Pcnl    . Cystoscopy/retrograde/ureteroscopy  08/24/2011    Procedure: CYSTOSCOPY/RETROGRADE/URETEROSCOPY;  Surgeon: Molli Hazard, MD;  Location: [facility];  Service: Urology;  Laterality: Right;  Cysto, Right retrograde Pyelogram, right stent placement.   . Cystoscopy with ureteroscopy  09/24/2011    Procedure: CYSTOSCOPY WITH URETEROSCOPY;  Surgeon: Malka So, MD;  [facility];  Service: Urology;  Laterality: Right;  Balloon dilation right ureter   . Cystoscopy w/ retrogrades  09/24/2011    Procedure: CYSTOSCOPY WITH RETROGRADE PYELOGRAM;  Surgeon: Malka So, MD;  [facility];  Service: Urology;  Laterality: Right;    There were no vitals filed for this visit.  Visit Diagnosis:  Left hemiparesis ([place])  Hemiplegia affecting left side in left-dominant patient as late effect of cerebrovascular disease [facility])      Subjective Assessment - 11/12/14 0934    Pertinent  History see epic snapshot. Pt has had 8 CVA's from 2003-2013   Patient Stated Goals I want to be able to use my left arm more.  I never got OT after my last stroke   Currently in Pain? Yes   Pain Score 8    Pain Location Arm   Pain Orientation Left   Pain Descriptors / Indicators Burning   Pain Type Chronic pain   Pain Onset More than a month ago   Pain Frequency Constant   Aggravating Factors  nothing   Pain Relieving Factors heat, topical rubs                      OT Treatments/Exercises (OP) - 11/12/14 0001    Exercises   Exercises Shoulder;Hand   Shoulder Exercises: ROM/Strengthening   UBE (Upper Arm Bike) x 8 min. Level 3 for strength/endurance   Other ROM/Strengthening Exercises Pt issued HEP for shoulder ROM/strengthening - see pt instructions   Hand Exercises   Theraputty - Grip x 10 reps with red putty (see pt instructions). Issued putty for HEP   Theraputty - Pinch X 10 reps each finger - see pt instructions   Fine Motor Coordination   Other Fine Motor Exercises Pt issued HEP - see pt instructions                OT Education - 11/12/14 1014    Education provided Yes   Education Details HEP for coordination, grip strength, and shoulder ROM/strength   Person(s) Educated Patient   Methods Explanation;Demonstration;Handout   Comprehension Verbalized understanding;Returned demonstration             OT Long Term Goals - 10/30/14 1332    OT LONG TERM GOAL #1   Title Pt will be mod I with HEP - 11/27/2014   Status New   OT LONG TERM GOAL #2   Title Pt will demonstrate improved grip strength by 5 pounds in LUE (baseline = 45 pounds)   Status New   OT LONG TERM GOAL #3   Title Pt will be able to lift 3 pound object off overhead shelf with LUE x3 without dropping   Status New   OT LONG TERM GOAL #4   Title Pt will verbalize understanding of AE for cutting, showering and toileting prn (pt plans to move into  own apartment at some point)    Baseline t   Status New   OT LONG TERM GOAL #5   Title Pt will be mod I with simple hot meal prep   Status New               Plan - 11/12/14 1024    Clinical Impression Statement Pt progressing towards LTG #1. Pt tolerating HEP well   Plan Review HEP prn, provide recommendations/handouts for A/E and DME for cutting, showering, toileting prn, if time allows -  prepare simple stovetop food   Consulted and Agree with Plan of Care Patient        Problem List Patient Active Problem List   Diagnosis Date Noted  . Sacroiliac dysfunction 10/09/2014  . Chronic low back pain 09/03/2014  . CVA, old, hemiparesis ([place]) 09/03/2014  . OSA (obstructive sleep apnea) 05/15/2014  . Restrictive airway disease 05/15/2014  . Colitis 02/22/2014  . Type 2 diabetes, uncontrolled, with renal manifestation ([place]) 01/21/2014  . Essential hypertension, benign 01/21/2014  . Chronic pain disorder 01/21/2014  . Migraine 09/07/2013  . DM type 2, uncontrolled, with neuropathy ([place]) 05/23/2013  . Mixed urge and stress incontinence 08/25/2011  . Ureteral stricture, right 08/25/2011  . Pyelonephritis 08/22/2011  . Nephrolithiasis 08/22/2011  . Incisional hernia 05/10/2011  . Obesity, Class III, BMI 40-49.9 (morbid obesity) ([place]) 05/10/2011  . Tachycardia 05/08/2011  . DIABETES MELLITUS, TYPE II 09/30/2006  . BLINDNESS, [place], [place] DEFINITION 09/14/2006  . HYPERLIPIDEMIA 09/13/2006  . Anxiety and depression 09/13/2006  . Essential hypertension 09/13/2006  . MYOCARDIAL INFARCTION, HX OF 09/13/2006  . GERD 09/13/2006  . SEIZURE DISORDER 09/13/2006    Carey Bullocks, OTR/L 11/12/2014, 10:26 AM  [facility] [address] [place], [place], [address] Phone: [phone]   Fax:  [phone]  Name: RAYCHELLE HUDMAN MRN: [id number] Date of Birth: 1965-06-06

## 2014-11-12 NOTE — Therapy (Signed)
[facility] [address] [place], [place], [address] Phone: [phone]   Fax:  [phone]  Physical Therapy Treatment  Patient Details  Name: [name] MRN: [id number] Date of Birth: [birth year] No Data Recorded  Encounter Date: 11/12/2014      PT End of Session - 11/12/14 1106    Visit Number 6   Number of Visits 17   Date for PT Re-Evaluation 12/16/14   Authorization Type UHC MCR, MCD secondary, G codes due to every 10th visit   PT Start Time 1015   PT Stop Time 1100   PT Time Calculation (min) 45 min   Activity Tolerance Patient tolerated treatment well   Behavior During Therapy [facility] for tasks assessed/performed      Past Medical History  Diagnosis Date  . Hypertension   . Asthma   . Seizures ([place])   . Heart murmur     born with   . Dysrhythmia     skipped beats  . Myocardial infarction ([place])     hi of x 2  last one 2003   . COPD (chronic obstructive pulmonary disease) (HCC)     chronic bronchitis   . Pneumonia     hx of 2009  . GERD (gastroesophageal reflux disease)   . Headache(784.0)     migraines   . Neuropathy ([place])     due to diabetes   . Dizziness     secondary to diabetes and hypertension   . Arthritis     joint pain   . Hx of blood clots     hematomas removed from left side of brain from 14mo to 89yrold   . Epilepsy idiopathic petit mal ([place]    last seizure 2012;controlled w/ topomax  . Diabetic neuropathy, painful ([place]    FEET AND HANDS  . Diabetes mellitus     since age [age]type 2 IDDM  . Sleep apnea     [facility] ; mild  . MIGRAINE HEADACHE 09/14/2006    Qualifier: Diagnosis of  By: MaHassell DoneNP, NyTori Milks  . Glaucoma     NOT ON ANY EYE DROPS   . Legally blind     left eye   . [facility]    [facility]  RESIDUAL LEFT LEG WEAKNESS--NO OTHER RESIDUAL PROBLEMS  . CVA 11/14/2007    Qualifier: Diagnosis of  By: MaHassell DoneNP,  NyTori Milks  . Pain 09/23/11    LOWER BACK AND RIGHT SIDE--PT HAS RIGHT URETERAL STONE  . Nephrolithiasis     frequent urination , urination at nite  PT SEEN IN ER 09/22/11 FOR BACK AND RT SIDED PAIN--HAS KNOWN STONE RT URETER AND UA IN ER SHOWED UTI  . Anxiety   . Depression   . Stress incontinence   . Hyperlipidemia   . Pyelonephritis     Past Surgical History  Procedure Laterality Date  . Kidney stone removal    . Brain hematoma evacuation      five procedures total, first procedure when [age]onths old, last at 49ears of age.  . Shunt removal      shunt inserted at age [age]emoved at age 49 . Eye surgery    . Retinal detachment surgery  1990  . Vaginal hysterectomy  1996  . Incisional hernia repair  05/05/2011    Procedure: LAPAROSCOPIC INCISIONAL HERNIA;  Surgeon: MaRolm BookbinderMD;  [facility];  Service: General;  Laterality: N/A;  . Mass excision  05/05/2011    Procedure: EXCISION MASS;  Surgeon: Rolm Bookbinder, MD;  Location: [place];  Service: General;  Laterality: Right;  . Hernia repair  05/05/11    lap incisional hernia  . Pcnl    . Cystoscopy/retrograde/ureteroscopy  08/24/2011    Procedure: CYSTOSCOPY/RETROGRADE/URETEROSCOPY;  Surgeon: Molli Hazard, MD;  [facility];  Service: Urology;  Laterality: Right;  Cysto, Right retrograde Pyelogram, right stent placement.   . Cystoscopy with ureteroscopy  09/24/2011    Procedure: CYSTOSCOPY WITH URETEROSCOPY;  Surgeon: Malka So, MD;  [facility];  Service: Urology;  Laterality: Right;  Balloon dilation right ureter   . Cystoscopy w/ retrogrades  09/24/2011    Procedure: CYSTOSCOPY WITH RETROGRADE PYELOGRAM;  Surgeon: Malka So, MD;  [facility];  Service: Urology;  Laterality: Right;    There were no vitals filed for this visit.  Visit Diagnosis:  Abnormality of gait  Generalized weakness  Unsteadiness      Subjective Assessment - 11/12/14 1017    Subjective "I'm moving into my own  place."     Limitations Standing;Walking;House hold activities   Patient Stated Goals To get rid of this pain, to fall less   Currently in Pain? Yes   Pain Score 8    Pain Location Arm   Pain Orientation Left   Pain Descriptors / Indicators Burning   Pain Type Chronic pain   Pain Onset More than a month ago   Pain Frequency Constant            [facility] PT Assessment - 11/12/14 1038    Functional Gait  Assessment   Gait assessed  Yes   Gait Level Surface Walks 20 ft in less than 7 sec but greater than 5.5 sec, uses assistive device, slower speed, mild gait deviations, or deviates 6-10 in outside of the 12 in walkway width.   Change in Gait Speed Able to change speed, demonstrates mild gait deviations, deviates 6-10 in outside of the 12 in walkway width, or no gait deviations, unable to achieve a major change in velocity, or uses a change in velocity, or uses an assistive device.   Gait with Horizontal Head Turns Performs head turns smoothly with slight change in gait velocity (eg, minor disruption to smooth gait path), deviates 6-10 in outside 12 in walkway width, or uses an assistive device.   Gait with Vertical Head Turns Performs task with slight change in gait velocity (eg, minor disruption to smooth gait path), deviates 6 - 10 in outside 12 in walkway width or uses assistive device   Gait and Pivot Turn Pivot turns safely within 3 sec and stops quickly with no loss of balance.   Step Over Obstacle Is able to step over one shoe box (4.5 in total height) but must slow down and adjust steps to clear box safely. May require verbal cueing.   Gait with Narrow Base of Support Ambulates 4-7 steps.   Gait with Eyes Closed Walks 20 ft, slow speed, abnormal gait pattern, evidence for imbalance, deviates 10-15 in outside 12 in walkway width. Requires more than 9 sec to ambulate 20 ft.   Ambulating Backwards Walks 20 ft, uses assistive device, slower speed, mild gait deviations, deviates 6-10 in  outside 12 in walkway width.   Steps Alternating feet, must use rail.   Total Score 18  NMR:  Addressed STG's by performing FGA.  Note score to have improved from 11/30 to 18/30, indicative of functional improvement in balance and decrease fall risk.  Educated pt on findings and improvement.    Gait:  Assessed gait in home simulated environment with use of SPC x 400' while negotiating through tight spaces, over thresholds, tiled floor and carpeted floor as well as over and around obstacles.  She was able to do this at S level with min cues for safe SPC placement and stepping strategy.  Also assessed gait on paved outdoor surfaces, including curb step and inclines/declines with use of SPC.  She was able to do this at S level with cues and education to pt to have family/friend with her with gait outdoors at this time.  Pt verbalized understanding.    Self care:  Questioned pt on CVA warning signs and symptoms as well as modifiable risk factors vs non-modifiable risk factors.  Pt did very well verbalizing good understanding of warning signs and symptoms, min cues for risk factors.  Pt verbalized understanding.                  PT Education - 11/12/14 1105    Education provided Yes   Education Details Education on gait outdoors with [facility] and S, also on current and LTG;s.    Person(s) Educated Patient   Methods Explanation   Comprehension Verbalized understanding          PT Short Term Goals - 11/12/14 1023    PT SHORT TERM GOAL #1   Title Pt will initiate HEP for balance and strengthening to increase functional strength and decrease fall risk.  (Target date: 11/14/14)   Baseline Pt reports performing 3x day 11/12/14   Time 4   Period Weeks   Status Achieved   PT SHORT TERM GOAL #2   Title Pt will increase FGA to 16/30 in order to indicate functional improvement in balance and decreased fall risk. (Target date: 11/14/14)   Baseline 18/30 11/12/14   Time 4    Period Weeks   Status Achieved   PT SHORT TERM GOAL #3   Title Pt will increase gait speed to 2.30 ft/sec in order to indicate decreased fall risk and improved efficiency of gait.  (Target date: 11/14/14)   Baseline 3.46 ft/sec on 11/12/14   PT SHORT TERM GOAL #4   Title Pt will verbalize CVA warning signs and risk factors independently in order to ensure pt understanding of when to seek medical care.  (Target date: 11/14/14)   Baseline met on 11/12/14   Time 4   Period Weeks   PT SHORT TERM GOAL #5   Title Pt will ambulate >300' with LRAD at S level negotiating around and over obstacles without overt LOB to indicate safe home negotiation.    Baseline S with use of SPC x 300' 11/12/14   Status Achieved           PT Long Term Goals - 10/17/14 1041    PT LONG TERM GOAL #1   Title Pt will be independent with HEP to indicate compliance and increased strengthening and decreased fall risk.  (Target Date: 12/12/14)   Baseline Pt dependent with HEP   Time 8   Period Weeks   PT LONG TERM GOAL #2   Title Pt will increase FGA score to 21/30 in order to indicate decreased fall risk and improved balance.  (Target Date: 12/12/14)   Baseline 11/30 on 10/17/14  Time 8   Period Weeks   PT LONG TERM GOAL #3   Title Pt will increase gait speed to 2.90 ft/sec in order to indicate decreased fall risk, return to community ambulation, and increased efficiency of gait. (Target Date: 12/12/14)   Baseline 1.70 ft/sec on 10/17/14   Time 8   Period Weeks   PT LONG TERM GOAL #4   Title Pt will ambulate >300' on outdoor surfaces including grass, curb and ramp without AD at mod I level in order to safely traverse in community.  (Target Date: 12/12/14)   Baseline Min/guard to min A over indoor surfaces   Time 8   Period Weeks   PT LONG TERM GOAL #5   Title Pt will increase [place] Impact Scale score to 56.8% to indicate improved pt perceived functional mobility status.  (Target Date: 12/12/14)   Baseline  41.7% on 10/17/14   Time 8   Period Weeks               Plan - 11/12/14 1108    Clinical Impression Statement Skilled session focused on addressing STG's.  Pt has met 5/5 STG's, making huge improvements to gait speed and FGA, dramatically decreasing fall risk.  Pt reports performing exercises 3x day also decreasing back pain.  Began to address gait outdoors today with SPC.  Pt able to ambulate on paved surfaces with SPC at S level.  Discussed addressing HEP next visit to add, increase difficulty, etc.     Pt will benefit from skilled therapeutic intervention in order to improve on the following deficits Abnormal gait;Decreased activity tolerance;Decreased balance;Decreased coordination;Decreased endurance;Decreased knowledge of precautions;Decreased knowledge of use of DME;Decreased mobility;Decreased safety awareness;Decreased strength;Difficulty walking;Impaired perceived functional ability;Impaired sensation;Decreased range of motion;Impaired UE functional use;Impaired vision/preception;Improper body mechanics;Postural dysfunction;Pain   Rehab Potential Good   Clinical Impairments Affecting Rehab Potential pt compliance with recommendations   PT Frequency 2x / week   PT Duration 8 weeks   PT Treatment/Interventions ADLs/Self Care Home Management;Electrical Stimulation;DME Instruction;Gait training;Stair training;Functional mobility training;Therapeutic activities;Therapeutic exercise;Balance training;Neuromuscular re-education;Patient/family education;Orthotic Fit/Training;Visual/perceptual remediation/compensation   PT Next Visit Plan Address current HEP (do we need to increase difficulty, add balance!) use of SPC on outdoor surfaces, including grass/gravel   Consulted and Agree with Plan of Care Patient        Problem List Patient Active Problem List   Diagnosis Date Noted  . Sacroiliac dysfunction 10/09/2014  . Chronic low back pain 09/03/2014  . CVA, old, hemiparesis ([place])  09/03/2014  . OSA (obstructive sleep apnea) 05/15/2014  . Restrictive airway disease 05/15/2014  . Colitis 02/22/2014  . Type 2 diabetes, uncontrolled, with renal manifestation ([place]) 01/21/2014  . Essential hypertension, benign 01/21/2014  . Chronic pain disorder 01/21/2014  . Migraine 09/07/2013  . DM type 2, uncontrolled, with neuropathy ([place]) 05/23/2013  . Mixed urge and stress incontinence 08/25/2011  . Ureteral stricture, right 08/25/2011  . Pyelonephritis 08/22/2011  . Nephrolithiasis 08/22/2011  . Incisional hernia 05/10/2011  . Obesity, Class III, BMI 40-49.9 (morbid obesity) ([place]) 05/10/2011  . Tachycardia 05/08/2011  . DIABETES MELLITUS, TYPE II 09/30/2006  . BLINDNESS, [place], [place] DEFINITION 09/14/2006  . HYPERLIPIDEMIA 09/13/2006  . Anxiety and depression 09/13/2006  . Essential hypertension 09/13/2006  . MYOCARDIAL INFARCTION, HX OF 09/13/2006  . GERD 09/13/2006  . SEIZURE DISORDER 09/13/2006   Cameron Sprang, PT, MPT [facility] [address] [place], [place], [address] Phone: [phone]   Fax:  [phone] 11/12/2014, 11:14 AM  Name: KIRSTEIN BAXLEY MRN: [id number] Date of Birth: 09-08-1965

## 2014-11-13 ENCOUNTER — Telehealth: Payer: Self-pay | Admitting: *Deleted

## 2014-11-13 ENCOUNTER — Encounter: Payer: Self-pay | Admitting: Physical Medicine & Rehabilitation

## 2014-11-13 ENCOUNTER — Encounter: Payer: Medicare Other | Attending: Physical Medicine & Rehabilitation

## 2014-11-13 ENCOUNTER — Ambulatory Visit (HOSPITAL_BASED_OUTPATIENT_CLINIC_OR_DEPARTMENT_OTHER): Payer: Medicare Other | Admitting: Physical Medicine & Rehabilitation

## 2014-11-13 VITALS — BP 161/81 | HR 77 | Resp 14

## 2014-11-13 DIAGNOSIS — E119 Type 2 diabetes mellitus without complications: Secondary | ICD-10-CM | POA: Diagnosis not present

## 2014-11-13 DIAGNOSIS — F329 Major depressive disorder, single episode, unspecified: Secondary | ICD-10-CM | POA: Insufficient documentation

## 2014-11-13 DIAGNOSIS — M545 Low back pain: Secondary | ICD-10-CM | POA: Insufficient documentation

## 2014-11-13 DIAGNOSIS — M533 Sacrococcygeal disorders, not elsewhere classified: Secondary | ICD-10-CM | POA: Diagnosis not present

## 2014-11-13 DIAGNOSIS — I69354 Hemiplegia and hemiparesis following cerebral infarction affecting left non-dominant side: Secondary | ICD-10-CM | POA: Diagnosis not present

## 2014-11-13 DIAGNOSIS — G8929 Other chronic pain: Secondary | ICD-10-CM | POA: Insufficient documentation

## 2014-11-13 NOTE — Progress Notes (Signed)
  [facility]   Name: JAIDEE STIPE DOB:[birth year] MRN: [id number]  Date:11/13/2014  Physician: Alysia Penna, MD    Nurse/CMA: Mancel Parsons  Allergies:  Allergies  Allergen Reactions  . Codeine Swelling and Other (See Comments)    Swelling and burning of mouth (inside)  . Lactose Intolerance (Gi) Nausea And Vomiting    Consent Signed: Yes.    Is patient diabetic? Yes.    CBG today? 100  Pregnant: No. LMP: No LMP recorded. Patient has had a hysterectomy. (age 15-54)  Anticoagulants: no Anti-inflammatory: no Antibiotics: no  Procedure: left sacroiliac steroid injection Position: Prone Start Time: 10:41am  End Time: 10:45am Fluoro Time: 17  RN/CMA Rolan Bucco Kadejah Sandiford    Time 10:08am 10:48am    BP 161/81 183/98    Pulse 77 72    Respirations 14 14    O2 Sat 100 99    S/S 6 6    Pain Level 8/10 7/10     D/C home with transportation, patient A & O X 3, D/C instructions reviewed, and sits independently.

## 2014-11-13 NOTE — Patient Instructions (Signed)
Sacroiliac injection was performed today. A combination of a naming medicine plus a cortisone medicine was injected. The injection was done under x-ray guidance. This procedure has been performed to help reduce low back and buttocks pain as well as potentially hip pain. The duration of this injection is variable lasting from hours to  Months. It may repeated if needed.

## 2014-11-13 NOTE — Progress Notes (Signed)
Left sacroiliac injection under fluoroscopic guidance  Indication: Left Low back and buttocks pain not relieved by medication management and other conservative care.  Informed consent was obtained after describing risks and benefits of the procedure with the patient, this includes bleeding, bruising, infection, paralysis and medication side effects. The patient wishes to proceed and has given written consent. The patient was placed in a prone position. The lumbar and sacral area was marked and prepped with Betadine. A 25-gauge 1-1/2 inch needle was inserted into the skin and subcutaneous tissue and 1 mL of 1% lidocaine was injected. Then a 25-gauge 3 inch spinal needle was inserted under fluoroscopic guidance into the left sacroiliac joint. AP and lateral images were utilized. Omnipaque 180x0.5 mL under live fluoroscopy demonstrated no intravascular uptake. Then a solution containing one ML of 6 mg per mL betamethasone and 2 ML of 1% lidocaine MPF was injected x1.5 mL. Patient tolerated the procedure well. Post procedure instructions were given. Please see post procedure form.

## 2014-11-13 NOTE — Telephone Encounter (Signed)
Received [place] From from [facility] [phone] Fax: [phone] Filled out and given to Janett Billow to review and sign.

## 2014-11-15 ENCOUNTER — Ambulatory Visit: Payer: Medicare Other | Admitting: Occupational Therapy

## 2014-11-15 ENCOUNTER — Encounter: Payer: Self-pay | Admitting: Occupational Therapy

## 2014-11-15 ENCOUNTER — Ambulatory Visit: Payer: Medicare Other | Admitting: Rehabilitation

## 2014-11-15 ENCOUNTER — Encounter: Payer: Self-pay | Admitting: Rehabilitation

## 2014-11-15 DIAGNOSIS — R269 Unspecified abnormalities of gait and mobility: Secondary | ICD-10-CM | POA: Diagnosis not present

## 2014-11-15 DIAGNOSIS — I69952 Hemiplegia and hemiparesis following unspecified cerebrovascular disease affecting left dominant side: Secondary | ICD-10-CM

## 2014-11-15 DIAGNOSIS — R531 Weakness: Secondary | ICD-10-CM

## 2014-11-15 DIAGNOSIS — M545 Low back pain, unspecified: Secondary | ICD-10-CM

## 2014-11-15 DIAGNOSIS — M79602 Pain in left arm: Secondary | ICD-10-CM

## 2014-11-15 DIAGNOSIS — R201 Hypoesthesia of skin: Secondary | ICD-10-CM

## 2014-11-15 NOTE — Therapy (Signed)
[facility] [address] [place], [place], [address] Phone: [phone]   Fax:  [phone]  Physical Therapy Treatment  Patient Details  Name: [name] MRN: [id number] Date of Birth: July 27, 1965 No Data Recorded  Encounter Date: 11/15/2014      PT End of Session - 11/15/14 1235    Visit Number 7   Number of Visits 17   Date for PT Re-Evaluation 12/16/14   Authorization Type UHC MCR, MCD secondary, G codes due to every 10th visit   PT Start Time 1016   PT Stop Time 1100   PT Time Calculation (min) 44 min   Activity Tolerance Patient tolerated treatment well   Behavior During Therapy [facility] for tasks assessed/performed      Past Medical History  Diagnosis Date  . Hypertension   . Asthma   . Seizures ([place])   . Heart murmur     born with   . Dysrhythmia     skipped beats  . Myocardial infarction ([place])     hi of x 2  last one 2003   . COPD (chronic obstructive pulmonary disease) (HCC)     chronic bronchitis   . Pneumonia     hx of 2009  . GERD (gastroesophageal reflux disease)   . Headache(784.0)     migraines   . Neuropathy ([place])     due to diabetes   . Dizziness     secondary to diabetes and hypertension   . Arthritis     joint pain   . Hx of blood clots     hematomas removed from left side of brain from 44mo to 89yrold   . Epilepsy idiopathic petit mal ([place]    last seizure 2012;controlled w/ topomax  . Diabetic neuropathy, painful ([facility]    FEET AND HANDS  . Diabetes mellitus     since age [age]type 2 IDDM  . Sleep apnea     [facility];does not use Cpap ; mild  . MIGRAINE HEADACHE 09/14/2006    Qualifier: Diagnosis of  By: MaHassell DoneNP, NyTori Milks  . Glaucoma     NOT ON ANY EYE DROPS   . Legally blind     left eye   . [facility]    [facility]  RESIDUAL LEFT LEG WEAKNESS--NO OTHER RESIDUAL PROBLEMS  . CVA 11/14/2007    Qualifier: Diagnosis of  By: MaHassell DoneNP,  NyTori Milks  . Pain 09/23/11    LOWER BACK AND RIGHT SIDE--PT HAS RIGHT URETERAL STONE  . Nephrolithiasis     frequent urination , urination at nite  PT SEEN IN ER 09/22/11 FOR BACK AND RT SIDED PAIN--HAS KNOWN STONE RT URETER AND UA IN ER SHOWED UTI  . Anxiety   . Depression   . Stress incontinence   . Hyperlipidemia   . Pyelonephritis     Past Surgical History  Procedure Laterality Date  . Kidney stone removal    . Brain hematoma evacuation      five procedures total, first procedure when [age]onths old, last at [age]ears of age.  . Shunt removal      shunt inserted at age [age]emoved at age 49 . Eye surgery    . Retinal detachment surgery  1990  . Vaginal hysterectomy  1996  . Incisional hernia repair  05/05/2011    Procedure: LAPAROSCOPIC INCISIONAL HERNIA;  Surgeon: MaRolm BookbinderMD;  [facility];  Service: General;  Laterality: N/A;  . Mass excision  05/05/2011    Procedure: EXCISION MASS;  Surgeon: Rolm Bookbinder, MD;  Location: [facility];  Service: General;  Laterality: Right;  . Hernia repair  05/05/11    lap incisional hernia  . Pcnl    . Cystoscopy/retrograde/ureteroscopy  08/24/2011    Procedure: CYSTOSCOPY/RETROGRADE/URETEROSCOPY;  Surgeon: Molli Hazard, MD;  Location: [facility];  Service: Urology;  Laterality: Right;  Cysto, Right retrograde Pyelogram, right stent placement.   . Cystoscopy with ureteroscopy  09/24/2011    Procedure: CYSTOSCOPY WITH URETEROSCOPY;  Surgeon: Malka So, MD;  [facility];  Service: Urology;  Laterality: Right;  Balloon dilation right ureter   . Cystoscopy w/ retrogrades  09/24/2011    Procedure: CYSTOSCOPY WITH RETROGRADE PYELOGRAM;  Surgeon: Malka So, MD;  [facility];  Service: Urology;  Laterality: Right;    There were no vitals filed for this visit.  Visit Diagnosis:  Generalized weakness  Abnormality of gait  Left-sided low back pain without sciatica      Subjective Assessment - 11/15/14 1022    Subjective  I got an injection in my back on Tuesday and I didnt know that I was supposed to get it, so I'm still in a lot of pain.     Limitations Standing;Walking;House hold activities   Patient Stated Goals To get rid of this pain, to fall less   Currently in Pain? Yes   Pain Score 7    Pain Location Back   Pain Orientation Mid;Lower   Pain Descriptors / Indicators Aching;Burning   Pain Type Chronic pain   Pain Onset More than a month ago   Pain Frequency Constant   Aggravating Factors  standing and walking   Pain Relieving Factors laying down              Therex:  Addressed core strength and hip strength/stability with posterior pelvic tilts x 10 reps with 5 sec hold.  Progressed to tilts with BLE bridging x 10 reps with cues for slowed movement for increased mm activation.  Increased activity difficulty with BLEs on physioball performing bridging x 10 reps with focus on increased RLE control on ball then adding hamstring curl while in bridge x 10 reps with demonstration and verbal cues for sequencing and technique as well as cues for increased control in RLE.   Continued to address core strength with modified sit ups while propped on wedge with pillows x 10 reps.  Note increased use of UEs therefore added 3.3 lb weight to decrease UE use and further engage core muscles.  Progressed to sit ups with weighted ball toss x 10 reps.  Tolerated well with rest breaks in between all tasks.  Progressed to sitting on physioball while performing slouched sitting transitioning to upright posture for increased anterior pelvic tilt.  Progressed to marching BLEs with cues for increased amplitude of marching as well slower speed for increased balance challenge and to increase core activation.  Progressed to alternating arm movement with head movement while in ball with cues for increased core activation.  Tolerated all well with rest breaks as needed.                   PT Education - 11/15/14 1024     Education provided Yes   Education Details Education on injection in [employer] joint   Person(s) Educated Patient   Methods Explanation   Comprehension Verbalized understanding  PT Short Term Goals - 11/12/14 1023    PT SHORT TERM GOAL #1   Title Pt will initiate HEP for balance and strengthening to increase functional strength and decrease fall risk.  (Target date: 11/14/14)   Baseline Pt reports performing 3x day 11/12/14   Time 4   Period Weeks   Status Achieved   PT SHORT TERM GOAL #2   Title Pt will increase FGA to 16/30 in order to indicate functional improvement in balance and decreased fall risk. (Target date: 11/14/14)   Baseline 18/30 11/12/14   Time 4   Period Weeks   Status Achieved   PT SHORT TERM GOAL #3   Title Pt will increase gait speed to 2.30 ft/sec in order to indicate decreased fall risk and improved efficiency of gait.  (Target date: 11/14/14)   Baseline 3.46 ft/sec on 11/12/14   PT SHORT TERM GOAL #4   Title Pt will verbalize CVA warning signs and risk factors independently in order to ensure pt understanding of when to seek medical care.  (Target date: 11/14/14)   Baseline met on 11/12/14   Time 4   Period Weeks   PT SHORT TERM GOAL #5   Title Pt will ambulate >300' with LRAD at S level negotiating around and over obstacles without overt LOB to indicate safe home negotiation.    Baseline S with use of SPC x 300' 11/12/14   Status Achieved           PT Long Term Goals - 10/17/14 1041    PT LONG TERM GOAL #1   Title Pt will be independent with HEP to indicate compliance and increased strengthening and decreased fall risk.  (Target Date: 12/12/14)   Baseline Pt dependent with HEP   Time 8   Period Weeks   PT LONG TERM GOAL #2   Title Pt will increase FGA score to 21/30 in order to indicate decreased fall risk and improved balance.  (Target Date: 12/12/14)   Baseline 11/30 on 10/17/14   Time 8   Period Weeks   PT LONG TERM GOAL #3   Title Pt  will increase gait speed to 2.90 ft/sec in order to indicate decreased fall risk, return to community ambulation, and increased efficiency of gait. (Target Date: 12/12/14)   Baseline 1.70 ft/sec on 10/17/14   Time 8   Period Weeks   PT LONG TERM GOAL #4   Title Pt will ambulate >300' on outdoor surfaces including grass, curb and ramp without AD at mod I level in order to safely traverse in community.  (Target Date: 12/12/14)   Baseline Min/guard to min A over indoor surfaces   Time 8   Period Weeks   PT LONG TERM GOAL #5   Title Pt will increase [place] Impact Scale score to 56.8% to indicate improved pt perceived functional mobility status.  (Target Date: 12/12/14)   Baseline 41.7% on 10/17/14   Time 8   Period Weeks               Plan - 11/15/14 1237    Clinical Impression Statement Skilled session focused on core stabilization and strength as well as muscle energy techniques to decrease pain in low back.  Tolerated all well with mild improvement in symptoms. Continue POC.    Pt will benefit from skilled therapeutic intervention in order to improve on the following deficits Abnormal gait;Decreased activity tolerance;Decreased balance;Decreased coordination;Decreased endurance;Decreased knowledge of precautions;Decreased knowledge of use of DME;Decreased mobility;Decreased safety awareness;Decreased  strength;Difficulty walking;Impaired perceived functional ability;Impaired sensation;Decreased range of motion;Impaired UE functional use;Impaired vision/preception;Improper body mechanics;Postural dysfunction;Pain   Rehab Potential Good   Clinical Impairments Affecting Rehab Potential pt compliance with recommendations   PT Frequency 2x / week   PT Duration 8 weeks   PT Treatment/Interventions ADLs/Self Care Home Management;Electrical Stimulation;DME Instruction;Gait training;Stair training;Functional mobility training;Therapeutic activities;Therapeutic exercise;Balance training;Neuromuscular  re-education;Patient/family education;Orthotic Fit/Training;Visual/perceptual remediation/compensation   PT Next Visit Plan Address current HEP (do we need to increase difficulty, add balance!) use of SPC on outdoor surfaces, including grass/gravel   Consulted and Agree with Plan of Care Patient        Problem List Patient Active Problem List   Diagnosis Date Noted  . Sacroiliac dysfunction 10/09/2014  . Chronic low back pain 09/03/2014  . CVA, old, hemiparesis ([place]) 09/03/2014  . OSA (obstructive sleep apnea) 05/15/2014  . Restrictive airway disease 05/15/2014  . Colitis 02/22/2014  . Type 2 diabetes, uncontrolled, with renal manifestation ([place]) 01/21/2014  . Essential hypertension, benign 01/21/2014  . Chronic pain disorder 01/21/2014  . Migraine 09/07/2013  . DM type 2, uncontrolled, with neuropathy ([place]) 05/23/2013  . Mixed urge and stress incontinence 08/25/2011  . Ureteral stricture, right 08/25/2011  . Pyelonephritis 08/22/2011  . Nephrolithiasis 08/22/2011  . Incisional hernia 05/10/2011  . Obesity, Class III, BMI 40-49.9 (morbid obesity) ([place]) 05/10/2011  . Tachycardia 05/08/2011  . DIABETES MELLITUS, TYPE II 09/30/2006  . BLINDNESS, [place], [place] DEFINITION 09/14/2006  . HYPERLIPIDEMIA 09/13/2006  . Anxiety and depression 09/13/2006  . Essential hypertension 09/13/2006  . MYOCARDIAL INFARCTION, HX OF 09/13/2006  . GERD 09/13/2006  . SEIZURE DISORDER 09/13/2006    Cameron Sprang, PT, MPT [facility] [address] [place], [place], [address] Phone: [phone]   Fax:  [phone] 11/15/2014, 12:39 PM  Name: [name] MRN: [id number] Date of Birth: 1965-03-06

## 2014-11-15 NOTE — Therapy (Signed)
[facility] [address] [place], [place], [address] Phone: [phone]   Fax:  [phone]  Occupational Therapy Treatment  Patient Details  Name: [name] MRN: [id number] Date of Birth: 1966-01-19 No Data Recorded  Encounter Date: 11/15/2014      OT End of Session - 11/15/14 1025    Visit Number 3   Number of Visits 8   Date for OT Re-Evaluation 12/25/14   Authorization Type medicare - will need g code   Authorization Time Period 60 days   OT Start Time [phone]  pt arrived late   OT Stop Time 0930   OT Time Calculation (min) 35 min   Activity Tolerance Patient tolerated treatment well      Past Medical History  Diagnosis Date  . Hypertension   . Asthma   . Seizures ([place])   . Heart murmur     born with   . Dysrhythmia     skipped beats  . Myocardial infarction ([place])     hi of x 2  last one 2003   . COPD (chronic obstructive pulmonary disease) (HCC)     chronic bronchitis   . Pneumonia     hx of 2009  . GERD (gastroesophageal reflux disease)   . Headache(784.0)     migraines   . Neuropathy ([place])     due to diabetes   . Dizziness     secondary to diabetes and hypertension   . Arthritis     joint pain   . Hx of blood clots     hematomas removed from left side of brain from 34mo to 89yrold   . Epilepsy idiopathic petit mal ([facility]    last seizure 2012;controlled w/ topomax  . Diabetic neuropathy, painful ([facility]    FEET AND HANDS  . Diabetes mellitus     since age 89type 2 IDDM  . Sleep apnea     [facility];does not use Cpap ; mild  . MIGRAINE HEADACHE 09/14/2006    Qualifier: Diagnosis of  By: MaHassell DoneNP, NyTori Milks  . Glaucoma     NOT ON ANY EYE DROPS   . Legally blind     left eye   . [facility]    [facility]  RESIDUAL LEFT LEG WEAKNESS--NO OTHER RESIDUAL PROBLEMS  . CVA 11/14/2007    Qualifier: Diagnosis of  By: MaHassell DoneNP, NyTori Milks  . Pain 09/23/11   LOWER BACK AND RIGHT SIDE--PT HAS RIGHT URETERAL STONE  . Nephrolithiasis     frequent urination , urination at nite  PT SEEN IN ER 09/22/11 FOR BACK AND RT SIDED PAIN--HAS KNOWN STONE RT URETER AND UA IN ER SHOWED UTI  . Anxiety   . Depression   . Stress incontinence   . Hyperlipidemia   . Pyelonephritis     Past Surgical History  Procedure Laterality Date  . Kidney stone removal    . Brain hematoma evacuation      five procedures total, first procedure when [age]onths old, last at [age]ears of age.  . Shunt removal      shunt inserted at age [age]emoved at age 89 . Eye surgery    . Retinal detachment surgery  1990  . Vaginal hysterectomy  1996  . Incisional hernia repair  05/05/2011    Procedure: LAPAROSCOPIC INCISIONAL HERNIA;  Surgeon: MaRolm BookbinderMD;  [facility]  Service: General;  Laterality: N/A;  . Mass excision  05/05/2011    Procedure: EXCISION MASS;  Surgeon: Rolm Bookbinder, MD;  Location: [facility];  Service: General;  Laterality: Right;  . Hernia repair  05/05/11    lap incisional hernia  . Pcnl    . Cystoscopy/retrograde/ureteroscopy  08/24/2011    Procedure: CYSTOSCOPY/RETROGRADE/URETEROSCOPY;  Surgeon: Molli Hazard, MD;  [facility];  Service: Urology;  Laterality: Right;  Cysto, Right retrograde Pyelogram, right stent placement.   . Cystoscopy with ureteroscopy  09/24/2011    Procedure: CYSTOSCOPY WITH URETEROSCOPY;  Surgeon: Malka So, MD;  [facility];  Service: Urology;  Laterality: Right;  Balloon dilation right ureter   . Cystoscopy w/ retrogrades  09/24/2011    Procedure: CYSTOSCOPY WITH RETROGRADE PYELOGRAM;  Surgeon: Malka So, MD;  [facility];  Service: Urology;  Laterality: Right;    There were no vitals filed for this visit.  Visit Diagnosis:  Hemiplegia affecting left side in left-dominant patient as late effect of cerebrovascular disease ([place])  Pain of left arm  Impaired sensation      Subjective Assessment -  11/15/14 0857    Subjective  I am looking at a handicappped accessible [address]   Pertinent History see epic snapshot. Pt has had 8 CVA's from 2003-2013   Patient Stated Goals I want to be able to use my left arm more.  I never got OT after my last stroke   Currently in Pain? Yes   Pain Score 8    Pain Location Back   Pain Orientation Lower   Pain Descriptors / Indicators Burning   Pain Type Chronic pain   Pain Onset More than a month ago  more than a year - pt seeing pain MD and had injection last week   Pain Frequency Constant   Aggravating Factors  a little bit of everything   Pain Relieving Factors warm bath, topical rubs   Effect of Pain on Daily Activities limits what she can do on a daily basis   Pain Score 7   Pain Location Arm  shoulder to hand   Pain Orientation Left   Pain Descriptors / Indicators Sharp   Pain Type Chronic pain   Pain Onset More than a month ago   Pain Frequency Constant   Aggravating Factors  certain movements   Pain Relieving Factors nothing                      OT Treatments/Exercises (OP) - 11/15/14 0001    ADLs   ADL Comments Reviewd HEP and pt states she is working all programs at home and did not have any questions. Pt is preparing to move into an apartment on her own. Session focused on AE to assist with more independent living.  Reviewed rocker knife, LH sponge, jar opener, shower seat/transfer tub bench, grab bar and recommended placement for tub vs shower stall (pt has not found apartment yet therefore do not know layout of bathroom) as well as 3 in 1 commode as pt has diffculty getting up from toilet.  Also discussed potential issues with hot meal prep. Discusseed need to do activity prior to recommending equipment. Pt in agreement. Pt given name, address and phone number of local medical supply store and will follow up on with them when she decides what she wants.  OT Long Term Goals  - 11/15/14 1024    OT LONG TERM GOAL #1   Title Pt will be mod I with HEP - 11/27/2014   Status On-going   OT LONG TERM GOAL #2   Title Pt will demonstrate improved grip strength by 5 pounds in LUE (baseline = 45 pounds)   Status On-going   OT LONG TERM GOAL #3   Title Pt will be able to lift 3 pound object off overhead shelf with LUE x3 without dropping   Status On-going   OT LONG TERM GOAL #4   Title Pt will verbalize understanding of AE for cutting, showering and toileting prn (pt plans to move into own apartment at some point)   Baseline t   Status On-going   OT LONG TERM GOAL #5   Title Pt will be mod I with simple hot meal prep   Status On-going               Plan - 11/15/14 1024    Clinical Impression Statement Pt is progressing toward goals. Pt states she is doing HEP and has no difficulty.    Pt will benefit from skilled therapeutic intervention in order to improve on the following deficits (Retired) Decreased balance;Decreased range of motion;Decreased mobility;Decreased knowledge of use of DME;Decreased strength;Impaired UE functional use;Impaired flexibility;Impaired sensation;Pain;Impaired perceived functional ability   Rehab Potential Fair   Clinical Impairments Affecting Rehab Potential perceived level of disability   OT Frequency 2x / week   OT Duration 4 weeks   OT Treatment/Interventions Self-care/ADL training;Therapeutic exercise;Neuromuscular education;DME and/or AE instruction;Manual Therapy;Therapist, nutritional;Therapeutic activities;Patient/family education;Balance training   Plan simple hot meal prep,    Consulted and Agree with Plan of Care Patient        Problem List Patient Active Problem List   Diagnosis Date Noted  . Sacroiliac dysfunction 10/09/2014  . Chronic low back pain 09/03/2014  . CVA, old, hemiparesis ([place]) 09/03/2014  . OSA (obstructive sleep apnea) 05/15/2014  . Restrictive airway disease 05/15/2014  . Colitis 02/22/2014   . Type 2 diabetes, uncontrolled, with renal manifestation ([place]) 01/21/2014  . Essential hypertension, benign 01/21/2014  . Chronic pain disorder 01/21/2014  . Migraine 09/07/2013  . DM type 2, uncontrolled, with neuropathy ([place]) 05/23/2013  . Mixed urge and stress incontinence 08/25/2011  . Ureteral stricture, right 08/25/2011  . Pyelonephritis 08/22/2011  . Nephrolithiasis 08/22/2011  . Incisional hernia 05/10/2011  . Obesity, Class III, BMI 40-49.9 (morbid obesity) ([place]) 05/10/2011  . Tachycardia 05/08/2011  . DIABETES MELLITUS, TYPE II 09/30/2006  . BLINDNESS, [place], [place] DEFINITION 09/14/2006  . HYPERLIPIDEMIA 09/13/2006  . Anxiety and depression 09/13/2006  . Essential hypertension 09/13/2006  . MYOCARDIAL INFARCTION, HX OF 09/13/2006  . GERD 09/13/2006  . SEIZURE DISORDER 09/13/2006    Quay Burow, OTR/L 11/15/2014, 10:26 AM  [facility] [address] [place], [place], [address] Phone: [phone]   Fax:  [phone]  Name: [name] MRN: [id number] Date of Birth: 05-02-1965

## 2014-11-20 ENCOUNTER — Ambulatory Visit: Payer: Medicare Other | Attending: Physical Medicine & Rehabilitation | Admitting: Rehabilitation

## 2014-11-20 ENCOUNTER — Ambulatory Visit (INDEPENDENT_AMBULATORY_CARE_PROVIDER_SITE_OTHER): Payer: Medicare Other | Admitting: Nurse Practitioner

## 2014-11-20 ENCOUNTER — Ambulatory Visit: Payer: Medicare Other | Admitting: Occupational Therapy

## 2014-11-20 ENCOUNTER — Encounter: Payer: Self-pay | Admitting: Nurse Practitioner

## 2014-11-20 ENCOUNTER — Encounter: Payer: Self-pay | Admitting: Occupational Therapy

## 2014-11-20 ENCOUNTER — Encounter: Payer: Self-pay | Admitting: Rehabilitation

## 2014-11-20 VITALS — BP 130/82 | HR 83 | Temp 98.6°F | Ht 66.0 in | Wt 225.0 lb

## 2014-11-20 DIAGNOSIS — M545 Low back pain: Secondary | ICD-10-CM

## 2014-11-20 DIAGNOSIS — B373 Candidiasis of vulva and vagina: Secondary | ICD-10-CM

## 2014-11-20 DIAGNOSIS — R531 Weakness: Secondary | ICD-10-CM | POA: Diagnosis present

## 2014-11-20 DIAGNOSIS — M6281 Muscle weakness (generalized): Secondary | ICD-10-CM | POA: Diagnosis present

## 2014-11-20 DIAGNOSIS — E1165 Type 2 diabetes mellitus with hyperglycemia: Secondary | ICD-10-CM

## 2014-11-20 DIAGNOSIS — E1140 Type 2 diabetes mellitus with diabetic neuropathy, unspecified: Secondary | ICD-10-CM

## 2014-11-20 DIAGNOSIS — Z1211 Encounter for screening for malignant neoplasm of colon: Secondary | ICD-10-CM | POA: Diagnosis not present

## 2014-11-20 DIAGNOSIS — F32A Depression, unspecified: Secondary | ICD-10-CM

## 2014-11-20 DIAGNOSIS — I10 Essential (primary) hypertension: Secondary | ICD-10-CM

## 2014-11-20 DIAGNOSIS — B3731 Acute candidiasis of vulva and vagina: Secondary | ICD-10-CM

## 2014-11-20 DIAGNOSIS — R2681 Unsteadiness on feet: Secondary | ICD-10-CM | POA: Diagnosis present

## 2014-11-20 DIAGNOSIS — G8929 Other chronic pain: Secondary | ICD-10-CM

## 2014-11-20 DIAGNOSIS — IMO0002 Reserved for concepts with insufficient information to code with codable children: Secondary | ICD-10-CM

## 2014-11-20 DIAGNOSIS — R269 Unspecified abnormalities of gait and mobility: Secondary | ICD-10-CM | POA: Insufficient documentation

## 2014-11-20 DIAGNOSIS — I69952 Hemiplegia and hemiparesis following unspecified cerebrovascular disease affecting left dominant side: Secondary | ICD-10-CM

## 2014-11-20 DIAGNOSIS — N3946 Mixed incontinence: Secondary | ICD-10-CM | POA: Diagnosis not present

## 2014-11-20 DIAGNOSIS — G8194 Hemiplegia, unspecified affecting left nondominant side: Secondary | ICD-10-CM | POA: Diagnosis present

## 2014-11-20 DIAGNOSIS — F329 Major depressive disorder, single episode, unspecified: Secondary | ICD-10-CM

## 2014-11-20 DIAGNOSIS — F418 Other specified anxiety disorders: Secondary | ICD-10-CM

## 2014-11-20 DIAGNOSIS — A6000 Herpesviral infection of urogenital system, unspecified: Secondary | ICD-10-CM | POA: Diagnosis not present

## 2014-11-20 DIAGNOSIS — F419 Anxiety disorder, unspecified: Secondary | ICD-10-CM

## 2014-11-20 MED ORDER — ACYCLOVIR 800 MG PO TABS: 400.0000 mg | ORAL_TABLET | Freq: Three times a day (TID) | ORAL | Status: DC

## 2014-11-20 MED ORDER — FLUCONAZOLE 150 MG PO TABS
ORAL_TABLET | ORAL | Status: DC
Start: 2014-11-20 — End: 2014-12-25

## 2014-11-20 MED ORDER — ACYCLOVIR 800 MG PO TABS: 800.0000 mg | ORAL_TABLET | Freq: Three times a day (TID) | ORAL | Status: DC

## 2014-11-20 NOTE — Therapy (Signed)
[facility] [address] [place], [place], [address] Phone: [phone]   Fax:  [phone]  Occupational Therapy Treatment  Patient Details  Name: [name] MRN: [id number] Date of Birth: Jun 18, 1965 No Data Recorded  Encounter Date: 11/20/2014      OT End of Session - 11/20/14 1219    Visit Number 4   Number of Visits 8   Date for OT Re-Evaluation 12/25/14   Authorization Type medicare - will need g code   Authorization Time Period 60 days   OT Start Time 1100   OT Stop Time 1145   OT Time Calculation (min) 45 min   Activity Tolerance Patient tolerated treatment well      Past Medical History  Diagnosis Date  . Hypertension   . Asthma   . Seizures ([place])   . Heart murmur     born with   . Dysrhythmia     skipped beats  . Myocardial infarction ([place])     hi of x 2  last one 2003   . COPD (chronic obstructive pulmonary disease) (HCC)     chronic bronchitis   . Pneumonia     hx of 2009  . GERD (gastroesophageal reflux disease)   . Headache(784.0)     migraines   . Neuropathy ([place])     due to diabetes   . Dizziness     secondary to diabetes and hypertension   . Arthritis     joint pain   . Hx of blood clots     hematomas removed from left side of brain from 61mo to 89yrold   . 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 Diabetic neuropathy, painful ([facility]    FEET AND HANDS  . Diabetes mellitus     since age [age]type 2 IDDM  . Sleep ap[facility]     [facility] ; mild  . MIGRAINE HEADACHE 09/14/2006    Qualifier: Diagnosis of  By: MaHassell DoneNP, NyTori Milks  . Glaucoma     NOT ON ANY EYE DROPS   . Legally blind     left eye   . [facility]    [facility]  RESIDUAL LEFT LEG WEAKNESS--NO OTHER RESIDUAL PROBLEMS  . CVA 11/14/2007    Qualifier: Diagnosis of  By: MaHassell DoneNP, NyTori Milks  . Pain 09/23/11    LOWER BACK AND RIGHT  SIDE--PT HAS RIGHT URETERAL STONE  . Nephrolithiasis     frequent urination , urination at nite  PT SEEN IN ER 09/22/11 FOR BACK AND RT SIDED PAIN--HAS KNOWN STONE RT URETER AND UA IN ER SHOWED UTI  . Anxiety   . Depression   . Stress incontinence   . Hyperlipidemia   . Pyelonephritis     Past Surgical History  Procedure Laterality Date  . Kidney stone removal    . Brain hematoma evacuation      five procedures total, first procedure when [age]onths old, last at [age]ears of age.  . Shunt removal      shunt inserted at age [age]emoved at age 49 . Eye surgery    . Retinal detachment surgery  1990  . Vaginal hysterectomy  1996  . Incisional hernia repair  05/05/2011    Procedure: LAPAROSCOPIC INCISIONAL HERNIA;  Surgeon: MaRolm BookbinderMD;  [facility] Service: General;  Laterality: N/A;  . Mass excision  05/05/2011    Procedure: EXCISION MASS;  Surgeon: Rolm Bookbinder, MD;  Location: [facility];  Service: General;  Laterality: Right;  . Hernia repair  05/05/11    lap incisional hernia  . Pcnl    . Cystoscopy/retrograde/ureteroscopy  08/24/2011    Procedure: CYSTOSCOPY/RETROGRADE/URETEROSCOPY;  Surgeon: Molli Hazard, MD;  [facility];  Service: Urology;  Laterality: Right;  Cysto, Right retrograde Pyelogram, right stent placement.   . Cystoscopy with ureteroscopy  09/24/2011    Procedure: CYSTOSCOPY WITH URETEROSCOPY;  Surgeon: Malka So, MD;  [facility];  Service: Urology;  Laterality: Right;  Balloon dilation right ureter   . Cystoscopy w/ retrogrades  09/24/2011    Procedure: CYSTOSCOPY WITH RETROGRADE PYELOGRAM;  Surgeon: Malka So, MD;  [facility];  Service: Urology;  Laterality: Right;    There were no vitals filed for this visit.  Visit Diagnosis:  Hemiplegia affecting left side in left-dominant patient as late effect of cerebrovascular disease ([place])  Generalized weakness      Subjective Assessment - 11/20/14 1109    Subjective  I've been  making my breakfast and my salads and boiling chicken at my daughters house   Pertinent History see epic snapshot. Pt has had 8 CVA's from 2003-2013   Patient Stated Goals I want to be able to use my left arm more.  I never got OT after my last stroke   Currently in Pain? Yes   Pain Score 8    Pain Location Arm   Pain Orientation Left   Pain Descriptors / Indicators Burning   Pain Type Chronic pain   Pain Onset More than a month ago   Pain Frequency Constant   Aggravating Factors  CERTAIN MOVEMENTS   Pain Relieving Factors HEAT, REST                      OT Treatments/Exercises (OP) - 11/20/14 0001    ADLs   Cooking Pt made scrambled egg in prep for living alone. Pt demo ability to gather all ingredients and perform activity thoroughly with only min cues for safety with Lt hand placement during stovetop part of task (in case Lt hand slips), and questioning cue/prompt to place eggs back in refrigerator (pt left on countertop after putting everything else away). Pt remembered to turn off stove I'ly but also suggested a timer. Pt washed dishes I'ly but cued pt to avoid getting water on floor to prevent falls and to rinse soap off dish completely.    ADL Comments Pt reports: I ordered a 3-in1 chopper to cut multiple types of vegetables. I also have the medical alert which I will re-activate when I get my apartment. I plan on getting the rocker knife, jar opener, and long handled sponge. I will also get an electric can opener                     OT Long Term Goals - 11/15/14 1024    OT LONG TERM GOAL #1   Title Pt will be mod I with HEP - 11/27/2014   Status On-going   OT LONG TERM GOAL #2   Title Pt will demonstrate improved grip strength by 5 pounds in LUE (baseline = 45 pounds)   Status On-going   OT LONG TERM GOAL #3   Title Pt will be able to lift 3 pound object off overhead shelf with LUE x3 without  dropping   Status On-going   OT LONG TERM GOAL #4   Title  Pt will verbalize understanding of AE for cutting, showering and toileting prn (pt plans to move into own apartment at some point)   Baseline t   Status On-going   OT LONG TERM GOAL #5   Title Pt will be mod I with simple hot meal prep   Status On-going               Plan - 11/20/14 1220    Clinical Impression Statement Pt making simple stovetop meal with only min cues for safety considerations.    Plan more advanced meal prep,    Consulted and Agree with Plan of Care Patient        Problem List Patient Active Problem List   Diagnosis Date Noted  . Sacroiliac dysfunction 10/09/2014  . Chronic low back pain 09/03/2014  . CVA, old, hemiparesis ([place]) 09/03/2014  . OSA (obstructive sleep apnea) 05/15/2014  . Restrictive airway disease 05/15/2014  . Colitis 02/22/2014  . Type 2 diabetes, uncontrolled, with renal manifestation ([place]) 01/21/2014  . Essential hypertension, benign 01/21/2014  . Chronic pain disorder 01/21/2014  . Migraine 09/07/2013  . DM type 2, uncontrolled, with neuropathy ([place]) 05/23/2013  . Mixed urge and stress incontinence 08/25/2011  . Ureteral stricture, right 08/25/2011  . Pyelonephritis 08/22/2011  . Nephrolithiasis 08/22/2011  . Incisional hernia 05/10/2011  . Obesity, Class III, BMI 40-49.9 (morbid obesity) ([place]) 05/10/2011  . Tachycardia 05/08/2011  . DIABETES MELLITUS, TYPE II 09/30/2006  . BLINDNESS, [place] DEFINITION 09/14/2006  . HYPERLIPIDEMIA 09/13/2006  . Anxiety and depression 09/13/2006  . Essential hypertension 09/13/2006  . MYOCARDIAL INFARCTION, HX OF 09/13/2006  . GERD 09/13/2006  . SEIZURE DISORDER 09/13/2006    Carey Bullocks, OTR/L 11/20/2014, 12:22 PM  [name] [address] [place], [place], [address] Phone: [phone]   Fax:  [phone]  Name: [name] MRN: [id number] Date of Birth: Feb 26, 1965

## 2014-11-20 NOTE — Patient Instructions (Addendum)
To come back in 3 weeks for blood work Follow up for routine visit in 4 months    Medication sent to [facility] to start today

## 2014-11-20 NOTE — Patient Instructions (Addendum)
  Lie on your back, arms by your side, bring belly button to floor and lift buttocks up. Hold up in air for 5 seconds. Perform x 10 reps. Do 2 times a day.     Lie on your back, pull belly button towards floor (think about flattening your back as much as possible), Hold for 3 seconds, then relax. Perform x 10 reps, Do 2 times a day.     Lie on your back as above, perform same movement and while holding spine flat, march your R leg then L leg up, then relax pelvis. Perform x 10 reps on each leg. Do 2 times per day.   Chair Pose    Stand with legs hip-width apart. Inhaling, bend knees trying to keep knees over ankles, as if to sit on a chair and extend arms in front, wrists slightly lower than shoulders. Hold position for __2_ breaths. Exhaling, stand up.  Keep chair behind you for safety, and also as a target.   Repeat _10__ times. Do _2__ times per day.  Copyright  VHI. All rights reserved.   For the next four exercises, attach red band to leg of sturdy furniture and have something to hold onto (chair beside of you).  You will do all four directions and then switch to other leg.    Balance, Proprioception: Hip Flexion With Tubing    With red band attached to ankle of uninvolved leg, swing leg forward. Return. Repeat __10__ times. Do __1__ sessions per day.  http://cc.exer.us/18   Copyright  VHI. All rights reserved.   (Clinic) Balance: With Hip Adduction - Unilateral Stance (Resist)    Stand with red band around ankle, the outside foot will need to be behind the inner leg so that you don't hit it. Swing foot across body and beyond. Use arm support for balance. Repeat __10__ times per set. Do __1__ sets per session.   Copyright  VHI. All rights reserved.   (Clinic) Balance: With Hip Abduction - Unilateral Stance (Resist)    Opposite side toward red band. Swing leg across body and out to side. Use arm support only as needed for balance.  Try not to tilt upper  body and trunk.  Repeat __10__ times per set. Do _1___ sets per session.   Copyright  VHI. All rights reserved.   Balance, Proprioception: Hip Extension With Tubing    With band attached to ankle of uninvolved leg, swing leg back. Return. Repeat __10__ times. Do __1__ sessions per day.  http://cc.exer.us/19   Copyright  VHI. All rights reserved.

## 2014-11-20 NOTE — Progress Notes (Signed)
Patient ID: [name], female   DOB: July 12, 1965, 49 y.o.   MRN: [id number]    PCP: Lauree Chandler, NP  Advanced Directive information    Allergies  Allergen Reactions  . Codeine Swelling and Other (See Comments)    Swelling and burning of mouth (inside)  . Lactose Intolerance (Gi) Nausea And Vomiting    Chief Complaint  Patient presents with  . Medical Management of Chronic Issues    3 month follow-up   . Sore    Raised area in private x 1 week, no drainage. Area is painful.   . Referral    Colonoscopy     HPI: Patient is a 49 y.o. female seen in the office today for routine follow up on chronic conditions. Pt with a pmh of back pain, OSA using CPAP, COPD, CAD, urinary incontinence, vit d def, diabetes.   Reports raised area in private area for 1 week that is painful. Also with increase white discharge. No itching, not very painful.  Back pain well managed with current pain specialist and PT.  Overactive bladder well controlled with myrbetiq   Blood sugars have been ranging 70-150   Review of Systems:  Review of Systems  Constitutional: Negative for activity change, appetite change, fatigue and unexpected weight change.  Respiratory: Negative for cough and shortness of breath.        Hx of sleep apnea has CPAP  Cardiovascular: Negative for chest pain, palpitations and leg swelling.  Gastrointestinal: Negative for nausea, abdominal pain, diarrhea, constipation and abdominal distention.  Genitourinary: Negative for dysuria, urgency, frequency and flank pain.  Musculoskeletal: Positive for myalgias and back pain.       Chronic pain- well controlled at this time  Skin: Negative for rash.  Neurological: Negative for dizziness, seizures (no seizures since 2014), weakness and headaches.  Psychiatric/Behavioral: Negative for suicidal ideas and hallucinations.    Past Medical History  Diagnosis Date  . Hypertension   . Asthma   . Seizures ([place])   . Heart murmur      born with   . Dysrhythmia     skipped beats  . Myocardial infarction ([place])     hi of x 2  last one 2003   . COPD (chronic obstructive pulmonary disease) (HCC)     chronic bronchitis   . Pneumonia     hx of 2009  . GERD (gastroesophageal reflux disease)   . Headache(784.0)     migraines   . Neuropathy ([place])     due to diabetes   . Dizziness     secondary to diabetes and hypertension   . Arthritis     joint pain   . Hx of blood clots     hematomas removed from left side of brain from 50mo to 89yrold   . Epilepsy idiopathic petit mal ([facility]    last seizure 2012;controlled w/ topomax  . Diabetic neuropathy, painful ([facility]    FEET AND HANDS  . Diabetes mellitus     since age [age]type 2 IDDM  . Sleep apnea     [facility] ; mild  . MIGRAINE HEADACHE 09/14/2006    Qualifier: Diagnosis of  By: MaHassell DoneNP, NyTori Milks  . Glaucoma     NOT ON ANY EYE DROPS   . Legally blind     left eye   . [facility]    [facility]  RESIDUAL LEFT LEG  WEAKNESS--NO OTHER RESIDUAL PROBLEMS  . CVA 11/14/2007    Qualifier: Diagnosis of  By: Hassell Done [name], Tori Milks    . Pain 09/23/11    LOWER BACK AND RIGHT SIDE--PT HAS RIGHT URETERAL STONE  . Nephrolithiasis     frequent urination , urination at nite  PT SEEN IN ER 09/22/11 FOR BACK AND RT SIDED PAIN--HAS KNOWN STONE RT URETER AND UA IN ER SHOWED UTI  . Anxiety   . Depression   . Stress incontinence   . Hyperlipidemia   . Pyelonephritis    Past Surgical History  Procedure Laterality Date  . Kidney stone removal    . Brain hematoma evacuation      five procedures total, first procedure when 75 months old, last at 49 years of age.  . Shunt removal      shunt inserted at age 89 removed at age 67   . Eye surgery    . Retinal detachment surgery  1990  . Vaginal hysterectomy  1996  . Incisional hernia repair  05/05/2011    Procedure: LAPAROSCOPIC INCISIONAL HERNIA;  Surgeon: Rolm Bookbinder, MD;  Location: [facility];  Service: General;  Laterality: N/A;  . Mass excision  05/05/2011    Procedure: EXCISION MASS;  Surgeon: Rolm Bookbinder, MD;  Location: [facility];  Service: General;  Laterality: Right;  . Hernia repair  05/05/11    lap incisional hernia  . Pcnl    . Cystoscopy/retrograde/ureteroscopy  08/24/2011    Procedure: CYSTOSCOPY/RETROGRADE/URETEROSCOPY;  Surgeon: Molli Hazard, MD;  [facility];  Service: Urology;  Laterality: Right;  Cysto, Right retrograde Pyelogram, right stent placement.   . Cystoscopy with ureteroscopy  09/24/2011    Procedure: CYSTOSCOPY WITH URETEROSCOPY;  Surgeon: Malka So, MD;  [facility];  Service: Urology;  Laterality: Right;  Balloon dilation right ureter   . Cystoscopy w/ retrogrades  09/24/2011    Procedure: CYSTOSCOPY WITH RETROGRADE PYELOGRAM;  Surgeon: Malka So, MD;  [facility];  Service: Urology;  Laterality: Right;   Social History:   reports that she quit smoking about 27 years ago. Her smoking use included Cigarettes. She has a 30 pack-year smoking history. She has never used smokeless tobacco. She reports that she drinks alcohol. She reports that she does not use illicit drugs.  Family History  Problem Relation Age of Onset  . Cancer Mother     uterine  . Hypertension Mother   . Cancer Maternal Grandmother     brain  . Hypertension Maternal Grandmother   . Anesthesia problems Neg Hx     Medications: Patient's Medications  New Prescriptions   No medications on file  Previous Medications   BACLOFEN (LIORESAL) 10 MG TABLET    TAKE 1 TABLET BY MOUTH THREE TIMES A DAY AS NEEDED FOR MUSCLE SPASMS   CHOLECALCIFEROL (VITAMIN D3) 2000 UNITS TABS    Take by mouth daily.   CLOPIDOGREL (PLAVIX) 75 MG TABLET    TAKE 1 TABLET BY MOUTH EVERY DAY   FLUOXETINE (PROZAC) 20 MG CAPSULE    Take 3 capsules (60 mg total) by mouth daily at 12 noon.   FLUTICASONE (FLONASE) 50 MCG/ACT NASAL SPRAY    Place 2 sprays into both nostrils daily.    GABAPENTIN (NEURONTIN) 300 MG CAPSULE    TAKE 2 CAPSULES BY MOUTH THREE TIMES A DAY   GLUCOSE BLOOD (ONETOUCH VERIO) TEST STRIP    1 each by Other route 3 (three) times daily. E11.29   IBUPROFEN (  ADVIL,MOTRIN) 600 MG TABLET    TAKE 1 TABLET BY MOUTH EVERY 8 HOURS AS NEEDED FOR PAIN   INSULIN GLARGINE (TOUJEO SOLOSTAR) 300 UNIT/ML SOPN    Inject 96 Units into the skin daily.   INSULIN LISPRO, HUMAN, (HUMALOG KWIKPEN) 200 UNIT/ML SOPN    Inject 5 Units into the skin 3 (three) times daily with meals.   LISINOPRIL (PRINIVIL,ZESTRIL) 40 MG TABLET    Take 1 tablet (40 mg total) by mouth daily.   MYRBETRIQ 25 MG TB24 TABLET    TAKE 1 TABLET BY MOUTH ONCE DAILY FOR BLADDER   ONDANSETRON (ZOFRAN ODT) 4 MG DISINTEGRATING TABLET    Take 1 tablet (4 mg total) by mouth every 8 (eight) hours as needed for nausea or vomiting.   PANTOPRAZOLE (PROTONIX) 40 MG TABLET    TAKE 1 TABLET BY MOUTH DAILY   POLYETHYLENE GLYCOL (MIRALAX) PACKET    Take 17 g by mouth daily.   POTASSIUM CHLORIDE SA (K-DUR,KLOR-CON) 20 MEQ TABLET    Take 2 tablets (40 mEq total) by mouth daily.   PROAIR HFA 108 (90 BASE) MCG/ACT INHALER    INHALE 2 PUFFS BY MOUTH EVERY 6 HOURS AS NEEDED FOR WHEEZING OR SHORTNESS OF BREATH   RANITIDINE (ZANTAC) 150 MG TABLET    TAKE 1 TABLET BY MOUTH TWICE DAILY   SIMVASTATIN (ZOCOR) 20 MG TABLET    TAKE 1 TABLET BY MOUTH EVERY MORNING FOR CHOLESTROL   SYMBICORT 160-4.5 MCG/ACT INHALER    INHALE 2 PUFFS BY MOUTH TWICE DAILY   TOPIRAMATE (TOPAMAX) 200 MG TABLET    TAKE 1 TABLET BY MOUTH TWICE DAILY   TOUJEO SOLOSTAR 300 UNIT/ML SOPN    INJECT 90 UNITS SUBCUTANEOUSLY EVERY DAY   TRAZODONE (DESYREL) 150 MG TABLET    Take 150 mg by mouth 2 (two) times daily.    VERAPAMIL (VERELAN PM) 240 MG 24 HR CAPSULE    TAKE 1 CAPSULE BY MOUTH EVERY NIGHT AT BEDTIME FOR BLOOD PRESSURE  Modified Medications   No medications on file  Discontinued Medications   BACLOFEN (LIORESAL) 10 MG TABLET    TAKE 1 TABLET BY MOUTH THREE  TIMES A DAY AS NEEDED FOR MUSCLE SPASMS   BLOOD GLUCOSE MONITORING SUPPL (ONE TOUCH ULTRA SYSTEM KIT) W/DEVICE KIT    1 kit by Does not apply route once.   EASY TOUCH LANCETS 32G/TWIST MISC    TEST BLOOD SUGAR FOUR TIMES A DAY   GLUCOSE BLOOD (ONE TOUCH ULTRA TEST) TEST STRIP    Use to check blood sugar three times daily. Dx: E11.29   LANCETS ULTRA FINE MISC    Use to test blood sugar three times daily. Dx: E11.29   TOPIRAMATE (TOPAMAX) 200 MG TABLET    TAKE 1 TABLET BY MOUTH TWICE DAILY   VITAMIN D, ERGOCALCIFEROL, (DRISDOL) 50000 UNITS CAPS CAPSULE    TAKE 1 CAPSULE BY MOUTH EVERY THURSDAY     Physical Exam:  Filed Vitals:   11/20/14 1339  BP: 130/82  Pulse: 83  Temp: 98.6 F (37 C)  TempSrc: Oral  Height: 5' 6"  (1.676 m)  Weight: 225 lb (102.059 kg)  SpO2: 97%   Body mass index is 36.33 kg/(m^2).  Physical Exam  Constitutional: She is oriented to person, place, and time. She appears well-developed and well-nourished. No distress.  HENT:  Head: Normocephalic and atraumatic.  Mouth/Throat: Oropharynx is clear and moist. No oropharyngeal exudate.  Eyes: Pupils are equal, round, and reactive to light.  Neck: Normal range  of motion. Neck supple.  Cardiovascular: Normal rate, regular rhythm and normal heart sounds.   Pulmonary/Chest: Effort normal and breath sounds normal.  Abdominal: Soft. Bowel sounds are normal. She exhibits no distension. There is no tenderness.  Genitourinary: There is lesion (large herpes lesion ) on the left labia. Vaginal discharge (white) found.  Musculoskeletal: Normal range of motion. She exhibits tenderness (lumbar spine). She exhibits no edema.  Neurological: She is alert and oriented to person, place, and time.  Skin: Skin is warm and dry. She is not diaphoretic.  Psychiatric: She has a normal mood and affect.    Labs reviewed: Basic Metabolic Panel:  Recent Labs  06/18/14 0001 06/21/14 1535 09/04/14 1030  NA 140 142 142  K 3.0* 4.0 3.9    CL 109 105 104  CO2 _0 GLUCOSE 188* 193* 120*  BUN _1 CREATININE 0.98 1.09* 1.07*  CALCIUM 9.1 9.1 9.1   Liver Function Tests:  Recent Labs  02/23/14 0530 06/18/14 0001 09/04/14 1030  AST 14 11* 9  ALT 13 12* 7  ALKPHOS 69 74 66  BILITOT 0.4 <0.1* <0.2  PROT 6.4 7.3 7.1  ALBUMIN 3.0* 3.5 3.9    Recent Labs  06/18/14 0001  LIPASE 22   No results for input(s): AMMONIA in the last 8760 hours. CBC:  Recent Labs  02/27/14 1026 03/15/14 1524 06/18/14 0001 09/04/14 1030  WBC 7.3 4.8 5.7 5.6  NEUTROABS  --  2.4 5.2 3.8  HGB 12.1 13.0 11.3*  --   HCT 37.1 39.7 36.1 36.5  MCV 91.8 91 91.9  --   PLT 286 285 202  --    Lipid Panel:  Recent Labs  12/25/13 0911  CHOL 161  HDL 38*  LDLCALC 97  TRIG 131  CHOLHDL 4.2   TSH: No results for input(s): TSH in the last 8760 hours. A1C: Lab Results  Component Value Date   HGBA1C 7.3* 09/04/2014     Assessment/Plan  1. Herpes genitalia -recurrent episode - acyclovir (ZOVIRAX) 800 MG tablet; Take 1 tablet (800 mg total) by mouth 3 (three) times daily.  Dispense: 21 tablet; Refill: 0  2. Vaginal candidiasis - fluconazole (DIFLUCAN) 150 MG tablet; Take 1 tablet today and repeat in 3 days  Dispense: 2 tablet; Refill: 0  3. Essential hypertension, benign Blood pressure stable, conts on verapamil 240 mg daily and lisinopril 40 daily -follow up bmp  4. DM type 2, uncontrolled, with neuropathy ([place]) -blood sugars in much better range, no hypoglycemia noted -cont current toujeo and humalog -cont diabetic diet.  - Hemoglobin A1C; Future - Basic metabolic panel; Future  5. Anxiety and depression -stable on prozac and trazodone  6. Chronic low back pain -followed with pain clinic, conts on baclofen, neurontin and advil   7. Mixed urge and stress incontinence -stable on myrbertiq   8. Special screening for malignant neoplasms, colon - Ambulatory referral to Gastroenterology  Follow up in 3  weeks for labs and 4 months for RV Najee Cowens K. Harle Battiest  [facility] [phone] 8 am - 5 pm) [phone] (after hours)

## 2014-11-20 NOTE — Therapy (Signed)
[facility] [address] [place], [place], [address] Phone: [phone]   Fax:  [phone]  Physical Therapy Treatment  Patient Details  Name: [name] MRN: [id number] Date of Birth: 05-21-1965 No Data Recorded  Encounter Date: 11/20/2014      PT End of Session - 11/20/14 1250    Visit Number 8   Number of Visits 17   Date for PT Re-Evaluation 12/16/14   Authorization Type UHC MCR, MCD secondary, G codes due to every 10th visit   PT Start Time 1148   PT Stop Time 1234   PT Time Calculation (min) 46 min   Activity Tolerance Patient tolerated treatment well   Behavior During Therapy [facility] for tasks assessed/performed      Past Medical History  Diagnosis Date  . Hypertension   . Asthma   . Seizures ([place])   . Heart murmur     born with   . Dysrhythmia     skipped beats  . Myocardial infarction ([place])     hi of x 2  last one 2003   . COPD (chronic obstructive pulmonary disease) (HCC)     chronic bronchitis   . Pneumonia     hx of 2009  . GERD (gastroesophageal reflux disease)   . Headache(784.0)     migraines   . Neuropathy ([place])     due to diabetes   . Dizziness     secondary to diabetes and hypertension   . Arthritis     joint pain   . Hx of blood clots     hematomas removed from left side of brain from 71mo to 56yrold   . Epilepsy idiopathic petit mal ([place]    last seizure 2012;controlled w/ topomax  . Diabetic neuropathy, painful ([place]    FEET AND HANDS  . Diabetes mellitus     since age 89type 2 IDDM IDDM  . Sleep ap[facility]     [facility];does not use Cpap ; mild  . MIGRAINE HEADACHE 09/14/2006    Qualifier: Diagnosis of  By: MaHassell DoneNP, NyTori Milks  . Glaucoma     NOT ON ANY EYE DROPS   . Legally blind     left eye   . [facility]    [facility]  RESIDUAL LEFT LEG WEAKNESS--NO OTHER RESIDUAL PROBLEMS  . CVA 11/14/2007    Qualifier: Diagnosis of  By: MaHassell DoneNP,  NyTori Milks  . Pain 09/23/11    LOWER BACK AND RIGHT SIDE--PT HAS RIGHT URETERAL STONE  . Nephrolithiasis     frequent urination , urination at nite  PT SEEN IN ER 09/22/11 FOR BACK AND RT SIDED PAIN--HAS KNOWN STONE RT URETER AND UA IN ER SHOWED UTI  . Anxiety   . Depression   . Stress incontinence   . Hyperlipidemia   . Pyelonephritis     Past Surgical History  Procedure Laterality Date  . Kidney stone removal    . Brain hematoma evacuation      five procedures total, first procedure when [age]onths old, last at [age]ears of age.  . Shunt removal      shunt inserted at age [age]emoved at age 49 . Eye surgery    . Retinal detachment surgery  1990  . Vaginal hysterectomy  1996  . Incisional hernia repair  05/05/2011    Procedure: LAPAROSCOPIC INCISIONAL HERNIA;  Surgeon: MaRolm BookbinderMD;  [facility];  Service: General;  Laterality: N/A;  . Mass excision  05/05/2011    Procedure: EXCISION MASS;  Surgeon: Rolm Bookbinder, MD;  Location: [facility];  Service: General;  Laterality: Right;  . Hernia repair  05/05/11    lap incisional hernia  . Pcnl    . Cystoscopy/retrograde/ureteroscopy  08/24/2011    Procedure: CYSTOSCOPY/RETROGRADE/URETEROSCOPY;  Surgeon: Molli Hazard, MD;  [facility];  Service: Urology;  Laterality: Right;  Cysto, Right retrograde Pyelogram, right stent placement.   . Cystoscopy with ureteroscopy  09/24/2011    Procedure: CYSTOSCOPY WITH URETEROSCOPY;  Surgeon: Malka So, MD;  [facility];  Service: Urology;  Laterality: Right;  Balloon dilation right ureter   . Cystoscopy w/ retrogrades  09/24/2011    Procedure: CYSTOSCOPY WITH RETROGRADE PYELOGRAM;  Surgeon: Malka So, MD;  [facility];  Service: Urology;  Laterality: Right;    There were no vitals filed for this visit.  Visit Diagnosis:  Generalized weakness  Abnormality of gait  Unsteadiness      Subjective Assessment - 11/20/14 1200    Subjective "I feel much better now since  my injection."     Limitations Standing;Walking;House hold activities   Patient Stated Goals To get rid of this pain, to fall less   Currently in Pain? Yes   Pain Score 8    Pain Location Arm   Pain Orientation Left   Pain Descriptors / Indicators Burning   Pain Type Chronic pain   Pain Onset More than a month ago   Aggravating Factors  certain movements   Pain Relieving Factors heat, rest, alcohol bath              Therex:  Performed current HEP and also added several strengthening and balance exercises to HEP.  See pt instruction for full details.  Performed all x 10 reps BLE.                     PT Education - 11/20/14 1250    Education provided Yes   Education Details Education on current and new additions to [employer]) Educated Patient   Methods Explanation;Handout   Comprehension Verbalized understanding          PT Short Term Goals - 11/12/14 1023    PT SHORT TERM GOAL #1   Title Pt will initiate HEP for balance and strengthening to increase functional strength and decrease fall risk.  (Target date: 11/14/14)   Baseline Pt reports performing 3x day 11/12/14   Time 4   Period Weeks   Status Achieved   PT SHORT TERM GOAL #2   Title Pt will increase FGA to 16/30 in order to indicate functional improvement in balance and decreased fall risk. (Target date: 11/14/14)   Baseline 18/30 11/12/14   Time 4   Period Weeks   Status Achieved   PT SHORT TERM GOAL #3   Title Pt will increase gait speed to 2.30 ft/sec in order to indicate decreased fall risk and improved efficiency of gait.  (Target date: 11/14/14)   Baseline 3.46 ft/sec on 11/12/14   PT SHORT TERM GOAL #4   Title Pt will verbalize CVA warning signs and risk factors independently in order to ensure pt understanding of when to seek medical care.  (Target date: 11/14/14)   Baseline met on 11/12/14   Time 4   Period Weeks   PT SHORT TERM GOAL #5   Title Pt  will ambulate >300' with LRAD  at S level negotiating around and over obstacles without overt LOB to indicate safe home negotiation.    Baseline S with use of SPC x 300' 11/12/14   Status Achieved           PT Long Term Goals - 10/17/14 1041    PT LONG TERM GOAL #1   Title Pt will be independent with HEP to indicate compliance and increased strengthening and decreased fall risk.  (Target Date: 12/12/14)   Baseline Pt dependent with HEP   Time 8   Period Weeks   PT LONG TERM GOAL #2   Title Pt will increase FGA score to 21/30 in order to indicate decreased fall risk and improved balance.  (Target Date: 12/12/14)   Baseline 11/30 on 10/17/14   Time 8   Period Weeks   PT LONG TERM GOAL #3   Title Pt will increase gait speed to 2.90 ft/sec in order to indicate decreased fall risk, return to community ambulation, and increased efficiency of gait. (Target Date: 12/12/14)   Baseline 1.70 ft/sec on 10/17/14   Time 8   Period Weeks   PT LONG TERM GOAL #4   Title Pt will ambulate >300' on outdoor surfaces including grass, curb and ramp without AD at mod I level in order to safely traverse in community.  (Target Date: 12/12/14)   Baseline Min/guard to min A over indoor surfaces   Time 8   Period Weeks   PT LONG TERM GOAL #5   Title Pt will increase Stoke Impact Scale score to 56.8% to indicate improved pt perceived functional mobility status.  (Target Date: 12/12/14)   Baseline 41.7% on 10/17/14   Time 8   Period Weeks               Plan - 11/20/14 1251    Clinical Impression Statement Skilled session focused on addressing current HEP, adding balance and strength.  See pt instruction for full details.  Tolerated all well, continue POC.    Pt will benefit from skilled therapeutic intervention in order to improve on the following deficits Abnormal gait;Decreased activity tolerance;Decreased balance;Decreased coordination;Decreased endurance;Decreased knowledge of precautions;Decreased knowledge of use of  DME;Decreased mobility;Decreased safety awareness;Decreased strength;Difficulty walking;Impaired perceived functional ability;Impaired sensation;Decreased range of motion;Impaired UE functional use;Impaired vision/preception;Improper body mechanics;Postural dysfunction;Pain   Rehab Potential Good   Clinical Impairments Affecting Rehab Potential pt compliance with recommendations   PT Frequency 2x / week   PT Duration 8 weeks   PT Treatment/Interventions ADLs/Self Care Home Management;Electrical Stimulation;DME Instruction;Gait training;Stair training;Functional mobility training;Therapeutic activities;Therapeutic exercise;Balance training;Neuromuscular re-education;Patient/family education;Orthotic Fit/Training;Visual/perceptual remediation/compensation   PT Next Visit Plan gait on outdoor surfaces, high level balance   Consulted and Agree with Plan of Care Patient        Problem List Patient Active Problem List   Diagnosis Date Noted  . Sacroiliac dysfunction 10/09/2014  . Chronic low back pain 09/03/2014  . CVA, old, hemiparesis ([place]) 09/03/2014  . OSA (obstructive sleep apnea) 05/15/2014  . Restrictive airway disease 05/15/2014  . Colitis 02/22/2014  . Type 2 diabetes, uncontrolled, with renal manifestation ([place]) 01/21/2014  . Essential hypertension, benign 01/21/2014  . Chronic pain disorder 01/21/2014  . Migraine 09/07/2013  . DM type 2, uncontrolled, with neuropathy ([place]) 05/23/2013  . Mixed urge and stress incontinence 08/25/2011  . Ureteral stricture, right 08/25/2011  . Pyelonephritis 08/22/2011  . Nephrolithiasis 08/22/2011  . Incisional hernia 05/10/2011  . Obesity, Class III, BMI 40-49.9 (morbid  obesity) ([place]) 05/10/2011  . Tachycardia 05/08/2011  . DIABETES MELLITUS, TYPE II 09/30/2006  . BLINDNESS, [place], [place] DEFINITION 09/14/2006  . HYPERLIPIDEMIA 09/13/2006  . Anxiety and depression 09/13/2006  . Essential hypertension 09/13/2006  . MYOCARDIAL INFARCTION, HX OF  09/13/2006  . GERD 09/13/2006  . SEIZURE DISORDER 09/13/2006    Cameron Sprang, PT, MPT [facility] [address] [place], [place], [address] Phone: [phone]   Fax:  [phone] 11/20/2014, 12:54 PM  Name: KEERSTIN BJELLAND MRN: [id number] Date of Birth: Sep 19, 1965

## 2014-11-21 ENCOUNTER — Encounter: Payer: Self-pay | Admitting: Gastroenterology

## 2014-11-22 ENCOUNTER — Encounter: Payer: Self-pay | Admitting: Occupational Therapy

## 2014-11-22 ENCOUNTER — Encounter: Payer: Self-pay | Admitting: Rehabilitation

## 2014-11-22 ENCOUNTER — Ambulatory Visit: Payer: Medicare Other | Admitting: Rehabilitation

## 2014-11-22 ENCOUNTER — Ambulatory Visit: Payer: Medicare Other | Admitting: Occupational Therapy

## 2014-11-22 DIAGNOSIS — M545 Low back pain, unspecified: Secondary | ICD-10-CM

## 2014-11-22 DIAGNOSIS — G8194 Hemiplegia, unspecified affecting left nondominant side: Secondary | ICD-10-CM

## 2014-11-22 DIAGNOSIS — R531 Weakness: Secondary | ICD-10-CM | POA: Diagnosis not present

## 2014-11-22 DIAGNOSIS — R2681 Unsteadiness on feet: Secondary | ICD-10-CM

## 2014-11-22 DIAGNOSIS — I69952 Hemiplegia and hemiparesis following unspecified cerebrovascular disease affecting left dominant side: Secondary | ICD-10-CM

## 2014-11-22 DIAGNOSIS — R269 Unspecified abnormalities of gait and mobility: Secondary | ICD-10-CM

## 2014-11-22 NOTE — Therapy (Signed)
[facility] [address] [place], [place], [address] Phone: [phone]   Fax:  [phone]  Physical Therapy Treatment  Patient Details  Name: [name] MRN: [id number] Date of Birth: 20-Oct-1965 No Data Recorded  Encounter Date: 11/22/2014      PT End of Session - 11/22/14 1238    Visit Number 9   Number of Visits 17   Date for PT Re-Evaluation 12/16/14   Authorization Type UHC MCR, MCD secondary, G codes due to every 10th visit   PT Start Time 1017   PT Stop Time 1100   PT Time Calculation (min) 43 min   Activity Tolerance Patient tolerated treatment well   Behavior During Therapy [facility] for tasks assessed/performed      Past Medical History  Diagnosis Date  . Hypertension   . Asthma   . Seizures ([place])   . Heart murmur     born with   . Dysrhythmia     skipped beats  . Myocardial infarction ([place])     hi of x 2  last one 2003   . COPD (chronic obstructive pulmonary disease) (HCC)     chronic bronchitis   . Pneumonia     hx of 2009  . GERD (gastroesophageal reflux disease)   . Headache(784.0)     migraines   . Neuropathy ([place])     due to diabetes   . Dizziness     secondary to diabetes and hypertension   . Arthritis     joint pain   . Hx of blood clots     hematomas removed from left side of brain from 41mo to 89yrold   . Epilepsy idiopathic petit mal ([place]    last seizure 2012;controlled w/ topomax  . Diabetic neuropathy, painful ([place]CD[place]    FEET AND HANDS  . Diabetes mellitus     since age 89type 2 IDDM  . Sleep ap[facility]     [facility] ; mild  . MIGRAINE HEADACHE 09/14/2006    Qualifier: Diagnosis of  By: MaHassell DoneNP, NyTori Milks  . Glaucoma     NOT ON ANY EYE DROPS   . Legally blind     left eye   . [facility]    [facility]  RESIDUAL LEFT LEG WEAKNESS--NO OTHER RESIDUAL PROBLEMS  . CVA 11/14/2007    Qualifier: Diagnosis of  By: MaHassell DoneNP,  NyTori Milks  . Pain 09/23/11    LOWER BACK AND RIGHT SIDE--PT HAS RIGHT URETERAL STONE  . Nephrolithiasis     frequent urination , urination at nite  PT SEEN IN ER 09/22/11 FOR BACK AND RT SIDED PAIN--HAS KNOWN STONE RT URETER AND UA IN ER SHOWED UTI  . Anxiety   . Depression   . Stress incontinence   . Hyperlipidemia   . Pyelonephritis     Past Surgical History  Procedure Laterality Date  . Kidney stone removal    . Brain hematoma evacuation      five procedures total, first procedure when [age]onths old, last at [age]ears of age.  . Shunt removal      shunt inserted at age 77 6emoved at age 49 . Eye surgery    . Retinal detachment surgery  1990  . Vaginal hysterectomy  1996  . Incisional hernia repair  05/05/2011    Procedure: LAPAROSCOPIC INCISIONAL HERNIA;  Surgeon: MaRolm BookbinderMD;  [facility];  Service: General;  Laterality: N/A;  . Mass excision  05/05/2011    Procedure: EXCISION MASS;  Surgeon: Rolm Bookbinder, MD;  Location: [facility];  Service: General;  Laterality: Right;  . Hernia repair  05/05/11    lap incisional hernia  . Pcnl    . Cystoscopy/retrograde/ureteroscopy  08/24/2011    Procedure: CYSTOSCOPY/RETROGRADE/URETEROSCOPY;  Surgeon: Molli Hazard, MD;  [facility];  Service: Urology;  Laterality: Right;  Cysto, Right retrograde Pyelogram, right stent placement.   . Cystoscopy with ureteroscopy  09/24/2011    Procedure: CYSTOSCOPY WITH URETEROSCOPY;  Surgeon: Malka So, MD;  [facility];  Service: Urology;  Laterality: Right;  Balloon dilation right ureter   . Cystoscopy w/ retrogrades  09/24/2011    Procedure: CYSTOSCOPY WITH RETROGRADE PYELOGRAM;  Surgeon: Malka So, MD;  [facility];  Service: Urology;  Laterality: Right;    There were no vitals filed for this visit.  Visit Diagnosis:  Abnormality of gait  Unsteadiness  Generalized weakness  Left-sided low back pain without sciatica  Left hemiparesis (HCC)      Subjective  Assessment - 11/22/14 1022    Subjective "I slept all day yesterday after my three appointments on Tuesday."    Limitations Standing;Walking;House hold activities   Patient Stated Goals To get rid of this pain, to fall less   Currently in Pain? Yes   Pain Score 7    Pain Location Arm   Pain Descriptors / Indicators Sharp   Pain Type Chronic pain   Pain Onset More than a month ago   Pain Frequency Intermittent   Aggravating Factors  certain movements   Pain Relieving Factors heat/rest   Pain Score 6   Pain Location Back   Pain Orientation Right;Left   Pain Descriptors / Indicators Aching;Sharp   Pain Type Chronic pain   Pain Onset More than a month ago   Pain Frequency Constant   Aggravating Factors  moving   Pain Relieving Factors heat              Gait:  Assessed gait outdoors on paved, unlevel, grass, over parking blocks and up/down curb step >500' at mod I level.  No signs of LOB or foot drag.  Also no use of cane needed.    NMR: Worked on SLS on more compliant surface with cone tapping alternating LEs and side stepping working towards tipping cones over and bringing back up to increase amount of time in SLS and to increase WB on L LE.  Tactile cues at hip abd in order to decrease dropped hip during activity.  Progressed to high level balance stepping RLE to 6" step then LLE tapping to highest step, again for SLS, endurance and WB.  Switched to stepping LLE to 6" step as before.  Both began with single UE support progressing to no UE support x 10 reps each.  Worked on wall bumps while standing on foam balance beam to increase hip extension and hip strategy to carryover to gait and balance.  Performed x 10 reps then progressed to sit<>stand while on foam balance beam x 10 reps with focus on slow controlled movements rather than using momentum for increased muscle activation and increased balance challenge.                     PT Education - 11/22/14 1025     Education provided Yes   Education Details importance of compliance with  HEP, working towards LTG's in future sessions.    Person(s) Educated Patient   Methods Explanation   Comprehension Verbalized understanding          PT Short Term Goals - 11/12/14 1023    PT SHORT TERM GOAL #1   Title Pt will initiate HEP for balance and strengthening to increase functional strength and decrease fall risk.  (Target date: 11/14/14)   Baseline Pt reports performing 3x day 11/12/14   Time 4   Period Weeks   Status Achieved   PT SHORT TERM GOAL #2   Title Pt will increase FGA to 16/30 in order to indicate functional improvement in balance and decreased fall risk. (Target date: 11/14/14)   Baseline 18/30 11/12/14   Time 4   Period Weeks   Status Achieved   PT SHORT TERM GOAL #3   Title Pt will increase gait speed to 2.30 ft/sec in order to indicate decreased fall risk and improved efficiency of gait.  (Target date: 11/14/14)   Baseline 3.46 ft/sec on 11/12/14   PT SHORT TERM GOAL #4   Title Pt will verbalize CVA warning signs and risk factors independently in order to ensure pt understanding of when to seek medical care.  (Target date: 11/14/14)   Baseline met on 11/12/14   Time 4   Period Weeks   PT SHORT TERM GOAL #5   Title Pt will ambulate >300' with LRAD at S level negotiating around and over obstacles without overt LOB to indicate safe home negotiation.    Baseline S with use of SPC x 300' 11/12/14   Status Achieved           PT Long Term Goals - 10/17/14 1041    PT LONG TERM GOAL #1   Title Pt will be independent with HEP to indicate compliance and increased strengthening and decreased fall risk.  (Target Date: 12/12/14)   Baseline Pt dependent with HEP   Time 8   Period Weeks   PT LONG TERM GOAL #2   Title Pt will increase FGA score to 21/30 in order to indicate decreased fall risk and improved balance.  (Target Date: 12/12/14)   Baseline 11/30 on 10/17/14   Time 8   Period  Weeks   PT LONG TERM GOAL #3   Title Pt will increase gait speed to 2.90 ft/sec in order to indicate decreased fall risk, return to community ambulation, and increased efficiency of gait. (Target Date: 12/12/14)   Baseline 1.70 ft/sec on 10/17/14   Time 8   Period Weeks   PT LONG TERM GOAL #4   Title Pt will ambulate >300' on outdoor surfaces including grass, curb and ramp without AD at mod I level in order to safely traverse in community.  (Target Date: 12/12/14)   Baseline Mod I 11/22/14   Time 8   Period Weeks   PT LONG TERM GOAL #5   Title Pt will increase Stoke Impact Scale score to 56.8% to indicate improved pt perceived functional mobility status.  (Target Date: 12/12/14)   Baseline 41.7% on 10/17/14   Time 8   Period Weeks               Plan - 11/22/14 1238    Clinical Impression Statement Skilled session focused on gait on outdoor surfaces to address safety.  Pt able to perform at mod I level over all surfaces without use of cane.  Also continue to address high level balance and NMR during sessions.  Tolerated well with mild c/o fatigue.    Pt will benefit from skilled therapeutic intervention in order to improve on the following deficits Abnormal gait;Decreased activity tolerance;Decreased balance;Decreased coordination;Decreased endurance;Decreased knowledge of precautions;Decreased knowledge of use of DME;Decreased mobility;Decreased safety awareness;Decreased strength;Difficulty walking;Impaired perceived functional ability;Impaired sensation;Decreased range of motion;Impaired UE functional use;Impaired vision/preception;Improper body mechanics;Postural dysfunction;Pain   Rehab Potential Good   Clinical Impairments Affecting Rehab Potential pt compliance with recommendations   PT Frequency 2x / week   PT Duration 8 weeks   PT Treatment/Interventions ADLs/Self Care Home Management;Electrical Stimulation;DME Instruction;Gait training;Stair training;Functional mobility  training;Therapeutic activities;Therapeutic exercise;Balance training;Neuromuscular re-education;Patient/family education;Orthotic Fit/Training;Visual/perceptual remediation/compensation   PT Next Visit Plan high level balance, core strengthening   Consulted and Agree with Plan of Care Patient        Problem List Patient Active Problem List   Diagnosis Date Noted  . Sacroiliac dysfunction 10/09/2014  . Chronic low back pain 09/03/2014  . CVA, old, hemiparesis ([place]) 09/03/2014  . OSA (obstructive sleep apnea) 05/15/2014  . Restrictive airway disease 05/15/2014  . Colitis 02/22/2014  . Type 2 diabetes, uncontrolled, with renal manifestation ([place]) 01/21/2014  . Essential hypertension, benign 01/21/2014  . Chronic pain disorder 01/21/2014  . Migraine 09/07/2013  . DM type 2, uncontrolled, with neuropathy ([place]) 05/23/2013  . Mixed urge and stress incontinence 08/25/2011  . Ureteral stricture, right 08/25/2011  . Pyelonephritis 08/22/2011  . Nephrolithiasis 08/22/2011  . Incisional hernia 05/10/2011  . Obesity, Class III, BMI 40-49.9 (morbid obesity) ([place]) 05/10/2011  . Tachycardia 05/08/2011  . DIABETES MELLITUS, TYPE II 09/30/2006  . BLINDNESS, [place], [place] DEFINITION 09/14/2006  . HYPERLIPIDEMIA 09/13/2006  . Anxiety and depression 09/13/2006  . Essential hypertension 09/13/2006  . MYOCARDIAL INFARCTION, HX OF 09/13/2006  . GERD 09/13/2006  . SEIZURE DISORDER 09/13/2006    Cameron Sprang, PT, MPT [facility] [address] [place], [place], [address] Phone: [phone]   Fax:  [phone] 11/22/2014, 12:41 PM  Name: [name] MRN: [id number] Date of Birth: [birth year]

## 2014-11-22 NOTE — Therapy (Signed)
[facility] [address] [place], [place], [address] Phone: [phone]   Fax:  [phone]  Occupational Therapy Treatment  Patient Details  Name: [name] MRN: [id number] Date of Birth: 20-Jul-1965 No Data Recorded  Encounter Date: 11/22/2014      OT End of Session - 11/22/14 1145    Visit Number 5   Number of Visits 8   Date for OT Re-Evaluation 12/25/14   Authorization Type medicare - will need g code   Authorization Time Period 60 days   OT Start Time 0930   OT Stop Time 1015   OT Time Calculation (min) 45 min   Activity Tolerance Patient tolerated treatment well      Past Medical History  Diagnosis Date  . Hypertension   . Asthma   . Seizures ([place])   . Heart murmur     born with   . Dysrhythmia     skipped beats  . Myocardial infarction ([place])     hi of x 2  last one 2003   . COPD (chronic obstructive pulmonary disease) ([facility])     chronic bronchitis   . Pneumonia     hx of 2009  . GERD (gastroesophageal reflux disease)   . Headache(784.0)     migraines   . Neuropathy ([place])     due to diabetes   . Dizziness     secondary to diabetes and hypertension   . Arthritis     joint pain   . Hx of blood clots     hematomas removed from left side of brain from 51mo to 89yrold   . Epilepsy idiopathic petit mal ([facility]    last seizure 2012;controlled w/ topomax  . Diabetic neuropathy, painful ([facility]    FEET AND HANDS  . Diabetes mellitus     since age 33type 2 IDDM  . Sleep apnea     [facility];does not use Cpap ; mild  . MIGRAINE HEADACHE 09/14/2006    Qualifier: Diagnosis of  By: MaHassell DoneNP, NyTori Milks  . Glaucoma     NOT ON ANY EYE DROPS   . Legally blind     left eye   . [facility]    last 2003  RESIDUAL LEFT LEG WEAKNESS--NO OTHER RESIDUAL PROBLEMS  . CVA 11/14/2007    Qualifier: Diagnosis of  By: MaHassell DoneNP, NyTori Milks  . Pain 09/23/11    LOWER BACK AND RIGHT  SIDE--PT HAS RIGHT URETERAL STONE  . Nephrolithiasis     frequent urination , urination at nite  PT SEEN IN ER 09/22/11 FOR BACK AND RT SIDED PAIN--HAS KNOWN STONE RT URETER AND UA IN ER SHOWED UTI  . Anxiety   . Depression   . Stress incontinence   . Hyperlipidemia   . Pyelonephritis     Past Surgical History  Procedure Laterality Date  . Kidney stone removal    . Brain hematoma evacuation      five procedures total, first procedure when [age]on[age] old, last at [age]ears of age.  . Shunt removal      shunt inserted at age [age]emoved at age 49 . Eye surgery    . Retinal detachment surgery  1990  . Vaginal hysterectomy  1996  . Incisional hernia repair  05/05/2011    Procedure: LAPAROSCOPIC INCISIONAL HERNIA;  Surgeon: MaRolm BookbinderMD;  [facility] Service: General;  Laterality: N/A;  . Mass excision  05/05/2011    Procedure: EXCISION MASS;  Surgeon: Rolm Bookbinder, MD;  Location: [facility];  Service: General;  Laterality: Right;  . Hernia repair  05/05/11    lap incisional hernia  . Pcnl    . Cystoscopy/retrograde/ureteroscopy  08/24/2011    Procedure: CYSTOSCOPY/RETROGRADE/URETEROSCOPY;  Surgeon: Molli Hazard, MD;  Location: [facility];  Service: Urology;  Laterality: Right;  Cysto, Right retrograde Pyelogram, right stent placement.   . Cystoscopy with ureteroscopy  09/24/2011    Procedure: CYSTOSCOPY WITH URETEROSCOPY;  Surgeon: Malka So, MD;  Location: [facility];  Service: Urology;  Laterality: Right;  Balloon dilation right ureter   . Cystoscopy w/ retrogrades  09/24/2011    Procedure: CYSTOSCOPY WITH RETROGRADE PYELOGRAM;  Surgeon: Malka So, MD;  Location: [facility];  Service: Urology;  Laterality: Right;    There were no vitals filed for this visit.  Visit Diagnosis:  Hemiplegia affecting left side in left-dominant patient as late effect of cerebrovascular disease ([place])  Generalized weakness      Subjective Assessment - 11/22/14 0935    Pertinent History see  epic snapshot. Pt has had 8 CVA's from 2003-2013   Patient Stated Goals I want to be able to use my left arm more.  I never got OT after my last stroke   Currently in Pain? Yes   Pain Score 7    Pain Location Arm   Pain Orientation Left   Pain Descriptors / Indicators Sharp   Pain Type Chronic pain   Pain Onset More than a month ago   Pain Frequency Intermittent   Aggravating Factors  certain movements   Pain Relieving Factors heat, rest   Effect of Pain on Daily Activities limits what she can do on a daily basis                      OT Treatments/Exercises (OP) - 11/22/14 0001    ADLs   ADL Comments Discussed further A/E and DME needs in prep for living alone for safety and independence including: tub bench vs. all in shower seat and which is safest. Recommended grab bars with all in shower seat if pt doesn't pursue tub bench, but explained tub bench safest. Provided handouts on both, as well as rocker knife and long handled sponge. Pt will also get electric can opener and jar opener, and re-activate medical alert.    Shoulder Exercises: ROM/Strengthening   Other ROM/Strengthening Exercises Pt seated lifting 3 lb. weight BUE's in shoulder flexion high level x 10 reps. Pt able to place 3 lb. weight on high shelf LUE x 5 reps w/o dropping   Hand Exercises   Other Hand Exercises Gripper set at level 2 to pick up blocks Lt hand for sustained grip strength with mod drops and difficulty.                      OT Long Term Goals - 11/22/14 1145    OT LONG TERM GOAL #1   Title Pt will be mod I with HEP - 11/27/2014   Status On-going   OT LONG TERM GOAL #2   Title Pt will demonstrate improved grip strength by 5 pounds in LUE (baseline = 45 pounds)   Status Not Met  11/22/14: 45 LBS   OT LONG TERM GOAL #3   Title Pt will be able to lift 3 pound object off overhead shelf with LUE  x3 without dropping   Status Achieved   OT LONG TERM GOAL #4   Title Pt will verbalize  understanding of AE for cutting, showering and toileting prn (pt plans to move into own apartment at some point)   Baseline t   Status Achieved   OT LONG TERM GOAL #5   Title Pt will be mod I with simple hot meal prep   Status On-going               Plan - 11/22/14 1146    Clinical Impression Statement Pt met LTG's #3 and #4. However, LTG #2 not met secondary to grip strength remains the same. Pt approximating remaining goals   Plan anticipate d/c next week. Issue theraband HEP for LUE strength, cooking task prior to d/c   Consulted and Agree with Plan of Care Patient        Problem List Patient Active Problem List   Diagnosis Date Noted  . Sacroiliac dysfunction 10/09/2014  . Chronic low back pain 09/03/2014  . CVA, old, hemiparesis ([place]) 09/03/2014  . OSA (obstructive sleep apnea) 05/15/2014  . Restrictive airway disease 05/15/2014  . Colitis 02/22/2014  . Type 2 diabetes, uncontrolled, with renal manifestation ([place]) 01/21/2014  . Essential hypertension, benign 01/21/2014  . Chronic pain disorder 01/21/2014  . Migraine 09/07/2013  . DM type 2, uncontrolled, with neuropathy ([place]) 05/23/2013  . Mixed urge and stress incontinence 08/25/2011  . Ureteral stricture, right 08/25/2011  . Pyelonephritis 08/22/2011  . Nephrolithiasis 08/22/2011  . Incisional hernia 05/10/2011  . Obesity, Class III, BMI 40-49.9 (morbid obesity) ([place]) 05/10/2011  . Tachycardia 05/08/2011  . DIABETES MELLITUS, TYPE II 09/30/2006  . BLINDNESS, [place], [place] DEFINITION 09/14/2006  . HYPERLIPIDEMIA 09/13/2006  . Anxiety and depression 09/13/2006  . Essential hypertension 09/13/2006  . MYOCARDIAL INFARCTION, HX OF 09/13/2006  . GERD 09/13/2006  . SEIZURE DISORDER 09/13/2006    Carey Bullocks, OTR/L 11/22/2014, 11:49 AM  [facility] [address] [place], [place], [address] Phone: [phone]   Fax:  [phone]  Name:  [name] MRN: [id number] Date of Birth: [birth year]

## 2014-11-27 ENCOUNTER — Ambulatory Visit: Payer: Medicare Other | Admitting: Occupational Therapy

## 2014-11-27 ENCOUNTER — Encounter: Payer: Self-pay | Admitting: Occupational Therapy

## 2014-11-27 ENCOUNTER — Encounter: Payer: Self-pay | Admitting: Rehabilitation

## 2014-11-27 ENCOUNTER — Emergency Department (HOSPITAL_COMMUNITY)
Admission: EM | Admit: 2014-11-27 | Discharge: 2014-11-27 | Disposition: A | Discharge status: Home / Self Care | Payer: Medicare Other | Attending: Emergency Medicine | Admitting: Emergency Medicine

## 2014-11-27 ENCOUNTER — Encounter (HOSPITAL_COMMUNITY): Payer: Self-pay | Admitting: Emergency Medicine

## 2014-11-27 ENCOUNTER — Emergency Department (HOSPITAL_COMMUNITY): Payer: Medicare Other

## 2014-11-27 ENCOUNTER — Ambulatory Visit: Payer: Medicare Other | Admitting: Rehabilitation

## 2014-11-27 VITALS — BP 141/89 | HR 67

## 2014-11-27 VITALS — BP 164/104

## 2014-11-27 DIAGNOSIS — F329 Major depressive disorder, single episode, unspecified: Secondary | ICD-10-CM | POA: Diagnosis not present

## 2014-11-27 DIAGNOSIS — Z8701 Personal history of pneumonia (recurrent): Secondary | ICD-10-CM | POA: Insufficient documentation

## 2014-11-27 DIAGNOSIS — Z7951 Long term (current) use of inhaled steroids: Secondary | ICD-10-CM | POA: Insufficient documentation

## 2014-11-27 DIAGNOSIS — I252 Old myocardial infarction: Secondary | ICD-10-CM | POA: Insufficient documentation

## 2014-11-27 DIAGNOSIS — G40909 Epilepsy, unspecified, not intractable, without status epilepticus: Secondary | ICD-10-CM | POA: Diagnosis not present

## 2014-11-27 DIAGNOSIS — H548 Legal blindness, as defined in USA: Secondary | ICD-10-CM | POA: Diagnosis not present

## 2014-11-27 DIAGNOSIS — R011 Cardiac murmur, unspecified: Secondary | ICD-10-CM | POA: Diagnosis not present

## 2014-11-27 DIAGNOSIS — Z79899 Other long term (current) drug therapy: Secondary | ICD-10-CM | POA: Diagnosis not present

## 2014-11-27 DIAGNOSIS — Z8742 Personal history of other diseases of the female genital tract: Secondary | ICD-10-CM | POA: Insufficient documentation

## 2014-11-27 DIAGNOSIS — Z7902 Long term (current) use of antithrombotics/antiplatelets: Secondary | ICD-10-CM | POA: Insufficient documentation

## 2014-11-27 DIAGNOSIS — I10 Essential (primary) hypertension: Secondary | ICD-10-CM

## 2014-11-27 DIAGNOSIS — Z9981 Dependence on supplemental oxygen: Secondary | ICD-10-CM | POA: Diagnosis not present

## 2014-11-27 DIAGNOSIS — Z8673 Personal history of transient ischemic attack (TIA), and cerebral infarction without residual deficits: Secondary | ICD-10-CM | POA: Insufficient documentation

## 2014-11-27 DIAGNOSIS — Z87442 Personal history of urinary calculi: Secondary | ICD-10-CM | POA: Insufficient documentation

## 2014-11-27 DIAGNOSIS — K219 Gastro-esophageal reflux disease without esophagitis: Secondary | ICD-10-CM | POA: Insufficient documentation

## 2014-11-27 DIAGNOSIS — Z794 Long term (current) use of insulin: Secondary | ICD-10-CM | POA: Diagnosis not present

## 2014-11-27 DIAGNOSIS — H509 Unspecified strabismus: Secondary | ICD-10-CM | POA: Insufficient documentation

## 2014-11-27 DIAGNOSIS — M1990 Unspecified osteoarthritis, unspecified site: Secondary | ICD-10-CM | POA: Insufficient documentation

## 2014-11-27 DIAGNOSIS — E785 Hyperlipidemia, unspecified: Secondary | ICD-10-CM | POA: Insufficient documentation

## 2014-11-27 DIAGNOSIS — G43909 Migraine, unspecified, not intractable, without status migrainosus: Secondary | ICD-10-CM | POA: Diagnosis not present

## 2014-11-27 DIAGNOSIS — E1140 Type 2 diabetes mellitus with diabetic neuropathy, unspecified: Secondary | ICD-10-CM | POA: Insufficient documentation

## 2014-11-27 DIAGNOSIS — Z87891 Personal history of nicotine dependence: Secondary | ICD-10-CM | POA: Insufficient documentation

## 2014-11-27 DIAGNOSIS — I69952 Hemiplegia and hemiparesis following unspecified cerebrovascular disease affecting left dominant side: Secondary | ICD-10-CM

## 2014-11-27 DIAGNOSIS — R2681 Unsteadiness on feet: Secondary | ICD-10-CM

## 2014-11-27 DIAGNOSIS — F419 Anxiety disorder, unspecified: Secondary | ICD-10-CM | POA: Insufficient documentation

## 2014-11-27 DIAGNOSIS — R531 Weakness: Secondary | ICD-10-CM | POA: Diagnosis not present

## 2014-11-27 DIAGNOSIS — H409 Unspecified glaucoma: Secondary | ICD-10-CM | POA: Insufficient documentation

## 2014-11-27 LAB — CBC WITH DIFFERENTIAL/PLATELET
Basophils Absolute: 0 10*3/uL (ref 0.0–0.1)
Basophils Relative: 0 %
Eosinophils Absolute: 0 10*3/uL (ref 0.0–0.7)
Eosinophils Relative: 0 %
HCT: 36.2 % (ref 36.0–46.0)
Hemoglobin: 11.5 g/dL — ABNORMAL LOW (ref 12.0–15.0)
Lymphocytes Relative: 27 %
Lymphs Abs: 1.5 10*3/uL (ref 0.7–4.0)
MCH: 29.3 pg (ref 26.0–34.0)
MCHC: 31.8 g/dL (ref 30.0–36.0)
MCV: 92.1 fL (ref 78.0–100.0)
Monocytes Absolute: 0.6 10*3/uL (ref 0.1–1.0)
Monocytes Relative: 10 %
Neutro Abs: 3.6 10*3/uL (ref 1.7–7.7)
Neutrophils Relative %: 63 %
Platelets: 254 10*3/uL (ref 150–400)
RBC: 3.93 MIL/uL (ref 3.87–5.11)
RDW: 14.2 % (ref 11.5–15.5)
WBC: 5.8 10*3/uL (ref 4.0–10.5)

## 2014-11-27 LAB — I-STAT CHEM 8, ED
BUN: 20 mg/dL (ref 6–20)
Calcium, Ion: 1.24 mmol/L — ABNORMAL HIGH (ref 1.12–1.23)
Chloride: 106 mmol/L (ref 101–111)
Creatinine, Ser: 1.1 mg/dL — ABNORMAL HIGH (ref 0.44–1.00)
Glucose, Bld: 96 mg/dL (ref 65–99)
HCT: 37 % (ref 36.0–46.0)
Hemoglobin: 12.6 g/dL (ref 12.0–15.0)
Potassium: 4 mmol/L (ref 3.5–5.1)
Sodium: 141 mmol/L (ref 135–145)
TCO2: 22 mmol/L (ref 0–100)

## 2014-11-27 LAB — I-STAT TROPONIN, ED: Troponin i, poc: 0 ng/mL (ref 0.00–0.08)

## 2014-11-27 NOTE — Discharge Instructions (Signed)
DASH Eating Plan DASH stands for "Dietary Approaches to Stop Hypertension." The DASH eating plan is a healthy eating plan that has been shown to reduce high blood pressure (hypertension). Additional health benefits may include reducing the risk of type 2 diabetes mellitus, heart disease, and stroke. The DASH eating plan may also help with weight loss. WHAT DO I NEED TO KNOW ABOUT THE DASH EATING PLAN? For the DASH eating plan, you will follow these general guidelines:  Choose foods with a percent daily value for sodium of less than 5% (as listed on the food label).  Use salt-free seasonings or herbs instead of table salt or sea salt.  Check with your health care provider or pharmacist before using salt substitutes.  Eat lower-sodium products, often labeled as "lower sodium" or "no salt added."  Eat fresh foods.  Eat more vegetables, fruits, and low-fat dairy products.  Choose whole grains. Look for the word "whole" as the first word in the ingredient list.  Choose fish and skinless chicken or [place] more often than red meat. Limit fish, poultry, and meat to 6 oz (170 g) each day.  Limit sweets, desserts, sugars, and sugary drinks.  Choose heart-healthy fats.  Limit cheese to 1 oz (28 g) per day.  Eat more home-cooked food and less restaurant, buffet, and fast food.  Limit fried foods.  Cook foods using methods other than frying.  Limit canned vegetables. If you do use them, rinse them well to decrease the sodium.  When eating at a restaurant, ask that your food be prepared with less salt, or no salt if possible. WHAT FOODS CAN I EAT? Seek help from a dietitian for individual calorie needs. Grains Whole grain or whole wheat bread. Brown rice. Whole grain or whole wheat pasta. Quinoa, bulgur, and whole grain cereals. Low-sodium cereals. Corn or whole wheat flour tortillas. Whole grain cornbread. Whole grain crackers. Low-sodium crackers. Vegetables Fresh or frozen vegetables  (raw, steamed, roasted, or grilled). Low-sodium or reduced-sodium tomato and vegetable juices. Low-sodium or reduced-sodium tomato sauce and paste. Low-sodium or reduced-sodium canned vegetables.  Fruits All fresh, canned (in natural juice), or frozen fruits. Meat and Other Protein Products Ground beef (85% or leaner), grass-fed beef, or beef trimmed of fat. Skinless chicken or [place]. Ground chicken or [place]. Pork trimmed of fat. All fish and seafood. Eggs. Dried beans, peas, or lentils. Unsalted nuts and seeds. Unsalted canned beans. Dairy Low-fat dairy products, such as skim or 1% milk, 2% or reduced-fat cheeses, low-fat ricotta or cottage cheese, or plain low-fat yogurt. Low-sodium or reduced-sodium cheeses. Fats and Oils Tub margarines without trans fats. Light or reduced-fat mayonnaise and salad dressings (reduced sodium). Avocado. Safflower, olive, or canola oils. Natural peanut or almond butter. Other Unsalted popcorn and pretzels. The items listed above may not be a complete list of recommended foods or beverages. Contact your dietitian for more options. WHAT FOODS ARE NOT RECOMMENDED? Grains White bread. White pasta. White rice. Refined cornbread. Bagels and croissants. Crackers that contain trans fat. Vegetables Creamed or fried vegetables. Vegetables in a cheese sauce. Regular canned vegetables. Regular canned tomato sauce and paste. Regular tomato and vegetable juices. Fruits Dried fruits. Canned fruit in light or heavy syrup. Fruit juice. Meat and Other Protein Products Fatty cuts of meat. Ribs, chicken wings, bacon, sausage, bologna, salami, chitterlings, fatback, hot dogs, bratwurst, and packaged luncheon meats. Salted nuts and seeds. Canned beans with salt. Dairy Whole or 2% milk, cream, half-and-half, and cream cheese. Whole-fat or sweetened yogurt. Full-fat  cheeses or blue cheese. Nondairy creamers and whipped toppings. Processed cheese, cheese spreads, or cheese  curds. Condiments Onion and garlic salt, seasoned salt, table salt, and sea salt. Canned and packaged gravies. Worcestershire sauce. Tartar sauce. Barbecue sauce. Teriyaki sauce. Soy sauce, including reduced sodium. Steak sauce. Fish sauce. Oyster sauce. Cocktail sauce. Horseradish. Ketchup and mustard. Meat flavorings and tenderizers. Bouillon cubes. Hot sauce. Tabasco sauce. Marinades. Taco seasonings. Relishes. Fats and Oils Butter, stick margarine, lard, shortening, ghee, and bacon fat. Coconut, palm kernel, or palm oils. Regular salad dressings. Other Pickles and olives. Salted popcorn and pretzels. The items listed above may not be a complete list of foods and beverages to avoid. Contact your dietitian for more information. WHERE CAN I FIND MORE INFORMATION? National Heart, Lung, and Blood Institute: travelstabloid.com   This information is not intended to replace advice given to you by your health care provider. Make sure you discuss any questions you have with your health care provider.   Document Released: 12/25/2010 Document Revised: 01/26/2014 Document Reviewed: 11/09/2012 Elsevier Interactive Patient Education 2016 [employer].  Hypertension Hypertension, commonly called high blood pressure, is when the force of blood pumping through your arteries is too strong. Your arteries are the blood vessels that carry blood from your heart throughout your body. A blood pressure reading consists of a higher number over a lower number, such as 110/72. The higher number (systolic) is the pressure inside your arteries when your heart pumps. The lower number (diastolic) is the pressure inside your arteries when your heart relaxes. Ideally you want your blood pressure below 120/80. Hypertension forces your heart to work harder to pump blood. Your arteries may become narrow or stiff. Having untreated or uncontrolled hypertension can cause heart attack, stroke, kidney  disease, and other problems. RISK FACTORS Some risk factors for high blood pressure are controllable. Others are not.  Risk factors you cannot control include:   Race. You may be at higher risk if you are African American.  Age. Risk increases with age.  Gender. Men are at higher risk than women before age 78 years. After age 16, women are at higher risk than men. Risk factors you can control include:  Not getting enough exercise or physical activity.  Being overweight.  Getting too much fat, sugar, calories, or salt in your diet.  Drinking too much alcohol. SIGNS AND SYMPTOMS Hypertension does not usually cause signs or symptoms. Extremely high blood pressure (hypertensive crisis) may cause headache, anxiety, shortness of breath, and nosebleed. DIAGNOSIS To check if you have hypertension, your health care provider will measure your blood pressure while you are seated, with your arm held at the level of your heart. It should be measured at least twice using the same arm. Certain conditions can cause a difference in blood pressure between your right and left arms. A blood pressure reading that is higher than normal on one occasion does not mean that you need treatment. If it is not clear whether you have high blood pressure, you may be asked to return on a different day to have your blood pressure checked again. Or, you may be asked to monitor your blood pressure at home for 1 or more weeks. TREATMENT Treating high blood pressure includes making lifestyle changes and possibly taking medicine. Living a healthy lifestyle can help lower high blood pressure. You may need to change some of your habits. Lifestyle changes may include:  Following the DASH diet. This diet is high in fruits, vegetables, and whole  grains. It is low in salt, red meat, and added sugars.  Keep your sodium intake below 2,300 mg per day.  Getting at least 30-45 minutes of aerobic exercise at least 4 times per  week.  Losing weight if necessary.  Not smoking.  Limiting alcoholic beverages.  Learning ways to reduce stress. Your health care provider may prescribe medicine if lifestyle changes are not enough to get your blood pressure under control, and if one of the following is true:  You are 30-9 years of age and your systolic blood pressure is above 140.  You are 4 years of age or older, and your systolic blood pressure is above 150.  Your diastolic blood pressure is above 90.  You have diabetes, and your systolic blood pressure is over 544 or your diastolic blood pressure is over 90.  You have kidney disease and your blood pressure is above 140/90.  You have heart disease and your blood pressure is above 140/90. Your personal target blood pressure may vary depending on your medical conditions, your age, and other factors. HOME CARE INSTRUCTIONS  Have your blood pressure rechecked as directed by your health care provider.   Take medicines only as directed by your health care provider. Follow the directions carefully. Blood pressure medicines must be taken as prescribed. The medicine does not work as well when you skip doses. Skipping doses also puts you at risk for problems.  Do not smoke.   Monitor your blood pressure at home as directed by your health care provider. SEEK MEDICAL CARE IF:   You think you are having a reaction to medicines taken.  You have recurrent headaches or feel dizzy.  You have swelling in your ankles.  You have trouble with your vision. SEEK IMMEDIATE MEDICAL CARE IF:  You develop a severe headache or confusion.  You have unusual weakness, numbness, or feel faint.  You have severe chest or abdominal pain.  You vomit repeatedly.  You have trouble breathing. MAKE SURE YOU:   Understand these instructions.  Will watch your condition.  Will get help right away if you are not doing well or get worse.   This information is not intended to  replace advice given to you by your health care provider. Make sure you discuss any questions you have with your health care provider.   Document Released: 01/05/2005 Document Revised: 05/22/2014 Document Reviewed: 10/28/2012 Elsevier Interactive Patient Education [employer].

## 2014-11-27 NOTE — Therapy (Signed)
[facility] [address] [place], [place], [address] Phone: [phone]   Fax:  [phone]  Patient Details  Name: [name] MRN: [id number] Date of Birth: 1965-05-25 Referring Provider:  Lauree Chandler, NP  Encounter Date: 11/27/2014   Note that during PT visit, pts BP was 164/104 initially, following removal of sweater and 3 mins later to ensure accurate reading, BP was 165/100 and following another 2 mins was 160/100.  Recommended that pt go to ED/urgent care to address due to long standing history of previous CVA's.  [employer] ride called and notified that pt would need to go to urgent care/ED.  Ride arrived and assisted pt out to [place].     Cameron Sprang, PT, MPT [facility] [address] [place], [place], [address] Phone: [phone]   Fax:  [phone] 11/27/2014, 10:53 AM

## 2014-11-27 NOTE — ED Notes (Signed)
Pt states since Friday BP has been high, has been compliant with meds. Pt c/o headache and left arm pain(sharp).

## 2014-11-27 NOTE — Therapy (Signed)
[facility] [address] [place], [place], [address] Phone: [phone]   Fax:  [phone]  Occupational Therapy Treatment  Patient Details  Name: [name] MRN: [id number] Date of Birth: [birth year] No Data Recorded  Encounter Date: 11/27/2014      OT End of Session - 11/27/14 1009    Visit Number 6   Number of Visits 8   Date for OT Re-Evaluation 12/25/14   Authorization Type medicare - will need g code   Authorization Time Period 60 days   OT Start Time 0930   OT Stop Time 1015   OT Time Calculation (min) 45 min   Activity Tolerance Patient tolerated treatment well      Past Medical History  Diagnosis Date  . Hypertension   . Asthma   . Seizures ([place])   . Heart murmur     born with   . Dysrhythmia     skipped beats  . Myocardial infarction ([place])     hi of x 2  last one 2003   . COPD (chronic obstructive pulmonary disease) (HCC)     chronic bronchitis   . Pneumonia     hx of 2009  . GERD (gastroesophageal reflux disease)   . Headache(784.0)     migraines   . Neuropathy ([place])     due to diabetes   . Dizziness     secondary to diabetes and hypertension   . Arthritis     joint pain   . Hx of blood clots     hematomas removed from left side of brain from 20mo to 89yrold   . Epilepsy idiopathic petit mal ([facility]    last seizure 2012;controlled w/ topomax  . Diabetic neuropathy, painful ([facility]    FEET AND HANDS  . Diabetes mellitus     since age 89type 2 IDDM  . Sleep apnea     [facility];does not use Cpap ; mild  . MIGRAINE HEADACHE 09/14/2006    Qualifier: Diagnosis of  By: MaHassell DoneNP, NyTori Milks  . Glaucoma     NOT ON ANY EYE DROPS   . Legally blind     left eye   . [facility]    [facility]  RESIDUAL LEFT LEG WEAKNESS--NO OTHER RESIDUAL PROBLEMS  . CVA 11/14/2007    Qualifier: Diagnosis of  By: MaHassell DoneNP, NyTori Milks  . Pain 09/23/11    LOWER BACK AND RIGHT  SIDE--PT HAS RIGHT URETERAL STONE  . Nephrolithiasis     frequent urination , urination at nite  PT SEEN IN ER 09/22/11 FOR BACK AND RT SIDED PAIN--HAS KNOWN STONE RT URETER AND UA IN ER SHOWED UTI  . Anxiety   . Depression   . Stress incontinence   . Hyperlipidemia   . Pyelonephritis     Past Surgical History  Procedure Laterality Date  . Kidney stone removal    . Brain hematoma evacuation      five procedures total, first procedure when [age]onths old, last at [age]ears of age.  . Shunt removal      shunt inserted at age [age]emoved at age 89 . Eye surgery    . Retinal detachment surgery  1990  . Vaginal hysterectomy  1996  . Incisional hernia repair  05/05/2011    Procedure: LAPAROSCOPIC INCISIONAL HERNIA;  Surgeon: MaRolm BookbinderMD;  [facility] Service: General;  Laterality: N/A;  . Mass excision  05/05/2011    Procedure: EXCISION MASS;  Surgeon: Rolm Bookbinder, MD;  Location: [facility];  Service: General;  Laterality: Right;  . Hernia repair  05/05/11    lap incisional hernia  . Pcnl    . Cystoscopy/retrograde/ureteroscopy  08/24/2011    Procedure: CYSTOSCOPY/RETROGRADE/URETEROSCOPY;  Surgeon: Molli Hazard, MD;  Location: [facility];  Service: Urology;  Laterality: Right;  Cysto, Right retrograde Pyelogram, right stent placement.   . Cystoscopy with ureteroscopy  09/24/2011    Procedure: CYSTOSCOPY WITH URETEROSCOPY;  Surgeon: Malka So, MD;  Location[facility];  Service: Urology;  Laterality: Right;  Balloon dilation right ureter   . Cystoscopy w/ retrogrades  09/24/2011    Procedure: CYSTOSCOPY WITH RETROGRADE PYELOGRAM;  Surgeon: Malka So, MD;  [facility];  Service: Urology;  Laterality: Right;    Filed Vitals:   11/27/14 0939  BP: 141/89  Pulse: 67    Visit Diagnosis:  Hemiplegia affecting left side in left-dominant patient as late effect of cerebrovascular disease (HCC)  Generalized weakness      Subjective Assessment - 11/27/14 0939     Subjective  I just want my own apt. My blood pressure last Friday was 250/190   Pertinent History see epic snapshot. Pt has had 8 CVA's from 2003-2013   Patient Stated Goals I want to be able to use my left arm more.  I never got OT after my last stroke   Currently in Pain? Yes   Pain Score 8    Pain Location Arm   Pain Orientation Left   Pain Descriptors / Indicators Sharp   Pain Type Chronic pain   Pain Onset More than a month ago   Pain Frequency Intermittent   Aggravating Factors  certain movements   Pain Relieving Factors heat/rest                      OT Treatments/Exercises (OP) - 11/27/14 0001    Shoulder Exercises: ROM/Strengthening   UBE (Upper Arm Bike) x 8 min. Level 3 for strength/endurance   Other ROM/Strengthening Exercises Pt issued theraband HEP - see pt instructions. Pt performed each x 10 reps                OT Education - 11/27/14 1003    Education provided Yes   Education Details Theraband HEP   Person(s) Educated Patient   Methods Explanation;Demonstration;Handout   Comprehension Verbalized understanding;Returned demonstration             OT Long Term Goals - 11/27/14 1009    OT LONG TERM GOAL #1   Title Pt will be mod I with HEP - 11/27/2014   Status Achieved   OT LONG TERM GOAL #2   Title Pt will demonstrate improved grip strength by 5 pounds in LUE (baseline = 45 pounds)   Status Not Met  11/22/14: 45 LBS   OT LONG TERM GOAL #3   Title Pt will be able to lift 3 pound object off overhead shelf with LUE x3 without dropping   Status Achieved   OT LONG TERM GOAL #4   Title Pt will verbalize understanding of AE for cutting, showering and toileting prn (pt plans to move into own apartment at some point)   Baseline t   Status Achieved   OT LONG TERM GOAL #5   Title Pt will be mod I with simple hot meal prep   Status On-going  Plan - 11/27/14 1010    Clinical Impression Statement Pt met LTG #1 today  and progressing with strength and endurance LUE    Plan Anticipate d/c next visit - will need G code. Cooking task to address remaining LTG   Consulted and Agree with Plan of Care Patient        Problem List Patient Active Problem List   Diagnosis Date Noted  . Sacroiliac dysfunction 10/09/2014  . Chronic low back pain 09/03/2014  . CVA, old, hemiparesis ([place]) 09/03/2014  . OSA (obstructive sleep apnea) 05/15/2014  . Restrictive airway disease 05/15/2014  . Colitis 02/22/2014  . Type 2 diabetes, uncontrolled, with renal manifestation ([place]) 01/21/2014  . Essential hypertension, benign 01/21/2014  . Chronic pain disorder 01/21/2014  . Migraine 09/07/2013  . DM type 2, uncontrolled, with neuropathy ([place]) 05/23/2013  . Mixed urge and stress incontinence 08/25/2011  . Ureteral stricture, right 08/25/2011  . Pyelonephritis 08/22/2011  . Nephrolithiasis 08/22/2011  . Incisional hernia 05/10/2011  . Obesity, Class III, BMI 40-49.9 (morbid obesity) ([place]) 05/10/2011  . Tachycardia 05/08/2011  . DIABETES MELLITUS, TYPE II 09/30/2006  . BLINDNESS, [place] DEFINITION 09/14/2006  . HYPERLIPIDEMIA 09/13/2006  . Anxiety and depression 09/13/2006  . Essential hypertension 09/13/2006  . MYOCARDIAL INFARCTION, HX OF 09/13/2006  . GERD 09/13/2006  . SEIZURE DISORDER 09/13/2006    Carey Bullocks, OTR/L 11/27/2014, 10:11 AM  [facility] [address] [place], [place], [address] Phone: [phone]   Fax:  [phone]  Name: [name] MRN: [id number] Date of Birth: 1965-07-16

## 2014-11-27 NOTE — ED Notes (Signed)
Unable to collect labs at this time NT working with patient.

## 2014-11-27 NOTE — Patient Instructions (Signed)
  Strengthening: Resisted Flexion   Hold tubing with __Lt___ arm(s) at side. Pull forward and up. Move shoulder through pain-free range of motion. Repeat __10__ times per set.  Do _1-2_ sessions per day , every other day   Strengthening: Resisted Extension   Hold tubing in __Lt___ hand(s), arm forward. Pull arm back, elbow straight. Repeat _10___ times per set. Do _1-2___ sessions per day, every other day.   Resisted Horizontal Abduction: Bilateral   Sit or stand, tubing in both hands, arms out in front. Keeping arms straight, pinch shoulder blades together and stretch arms out. Repeat _10___ times per set. Do _1-2___ sessions per day, every other day.   Elbow Flexion: Resisted   With tubing held in __Lt____ hand(s) and other end secured under foot, curl arm up as far as possible. Repeat _10___ times per set. Do _1-2___ sessions per day, every other day.    Elbow Extension: Resisted   Sit in chair with resistive band secure held at shoulder with Rt hand) and ___Lt____ elbow bent. Straighten Lt elbow by pulling band down with Lt hand (keep Rt end still) Repeat _10___ times per set.  Do _1-2___ sessions per day, every other day.   Copyright  VHI. All rights reserved.

## 2014-11-27 NOTE — ED Provider Notes (Signed)
CSN: [id number]     Arrival date & time 11/27/14  1101 History   First MD Initiated Contact with Patient 11/27/14 1117     Chief Complaint  Patient presents with  . Hypertension     (Consider location/radiation/quality/duration/timing/severity/associated sxs/prior Treatment) HPI Comments: Patient is a 49 year old female with a history of hypertension, asthma, seizures, MI, COPD, recurrent strokes and blood clots who presents today for hypertension. She says her home health aide came on Friday and her blood pressure was elevated at 250/190 over when she went to rehabilitation today her pressure was 169/100 and she was sent here to be further evaluated. Patient takes lisinopril and verapamil which she has been on for some time and has not missed any doses. She denies any new history of headache and states she gets occasional chest pains all the time but can't say if there is anything new as she suffers from with chronic pain daily. Currently she is denying any new weakness or numbness, chest pain or shortness of breath at this time. Her blood pressure normally runs between 409-811 systolic over 91Y diastolic  Patient is a 49 y.o. female presenting with hypertension. The history is provided by the patient.  Hypertension This is a recurrent problem.    Past Medical History  Diagnosis Date  . Hypertension   . Asthma   . Seizures ([place])   . Heart murmur     born with   . Dysrhythmia     skipped beats  . Myocardial infarction ([place])     hi of x 2  last one 2003   . COPD (chronic obstructive pulmonary disease) (HCC)     chronic bronchitis   . Pneumonia     hx of 2009  . GERD (gastroesophageal reflux disease)   . Headache(784.0)     migraines   . Neuropathy ([place])     due to diabetes   . Dizziness     secondary to diabetes and hypertension   . Arthritis     joint pain   . Hx of blood clots     hematomas removed from left side of brain from 27mo to 89yrold   . Epilepsy idiopathic petit  mal ([facility]    last seizure 2012;controlled w/ topomax  . Diabetic neuropathy, painful ([facility]    FEET AND HANDS  . Diabetes mellitus     since age [age]type 2 IDDM  . Sleep apnea     [facility] ; mild  . MIGRAINE HEADACHE 09/14/2006    Qualifier: Diagnosis of  By: MaHassell DoneNP, NyTori Milks  . Glaucoma     NOT ON ANY EYE DROPS   . Legally blind     left eye   . [facility]    [facility]  RESIDUAL LEFT LEG WEAKNESS--NO OTHER RESIDUAL PROBLEMS  . CVA 11/14/2007    Qualifier: Diagnosis of  By: MaHassell DoneNP, NyTori Milks  . Pain 09/23/11    LOWER BACK AND RIGHT SIDE--PT HAS RIGHT URETERAL STONE  . Nephrolithiasis     frequent urination , urination at nite  PT SEEN IN ER 09/22/11 FOR BACK AND RT SIDED PAIN--HAS KNOWN STONE RT URETER AND UA IN ER SHOWED UTI  . Anxiety   . Depression   . Stress incontinence   . Hyperlipidemia   . Pyelonephritis    Past Surgical History  Procedure Laterality Date  . Kidney stone removal    .  Brain hematoma evacuation      five procedures total, first procedure when 52 months old, last at 49 years of age.  . Shunt removal      shunt inserted at age 48 removed at age 39   . Eye surgery    . Retinal detachment surgery  1990  . Vaginal hysterectomy  1996  . Incisional hernia repair  05/05/2011    Procedure: LAPAROSCOPIC INCISIONAL HERNIA;  Surgeon: Rolm Bookbinder, MD;  Location: [facility];  Service: General;  Laterality: N/A;  . Mass excision  05/05/2011    Procedure: EXCISION MASS;  Surgeon: Rolm Bookbinder, MD;  Location: [facility];  Service: General;  Laterality: Right;  . Hernia repair  05/05/11    lap incisional hernia  . Pcnl    . Cystoscopy/retrograde/ureteroscopy  08/24/2011    Procedure: CYSTOSCOPY/RETROGRADE/URETEROSCOPY;  Surgeon: Molli Hazard, MD;  [facility];  Service: Urology;  Laterality: Right;  Cysto, Right retrograde Pyelogram, right stent placement.   . Cystoscopy with ureteroscopy  09/24/2011     Procedure: CYSTOSCOPY WITH URETEROSCOPY;  Surgeon: Malka So, MD;  [facility];  Service: Urology;  Laterality: Right;  Balloon dilation right ureter   . Cystoscopy w/ retrogrades  09/24/2011    Procedure: CYSTOSCOPY WITH RETROGRADE PYELOGRAM;  Surgeon: Malka So, MD;  [facility];  Service: Urology;  Laterality: Right;   Family History  Problem Relation Age of Onset  . Cancer Mother     uterine  . Hypertension Mother   . Cancer Maternal Grandmother     brain  . Hypertension Maternal Grandmother   . Anesthesia problems Neg Hx    Social History  Substance Use Topics  . Smoking status: Former Smoker -- 1.00 packs/day for 30 years    Types: Cigarettes    Quit date: 01/20/1987  . Smokeless tobacco: Never Used     Comment: quit more than 20years  . Alcohol Use: 0.0 oz/week    0 Standard drinks or equivalent per week     Comment: 1 glass weekly   OB History    Gravida Para Term Preterm AB TAB SAB Ectopic Multiple Living   3 2 2  1  1   2      Review of Systems  All other systems reviewed and are negative.     Allergies  Codeine and Lactose intolerance (gi)  Home Medications   Prior to Admission medications   Medication Sig Start Date End Date Taking? Authorizing Provider  acyclovir (ZOVIRAX) 800 MG tablet Take 1 tablet (800 mg total) by mouth 3 (three) times daily. 11/20/14   Lauree Chandler, NP  acyclovir (ZOVIRAX) 800 MG tablet Take 0.5 tablets (400 mg total) by mouth 3 (three) times daily. 11/20/14   Lauree Chandler, NP  baclofen (LIORESAL) 10 MG tablet TAKE 1 TABLET BY MOUTH THREE TIMES A DAY AS NEEDED FOR MUSCLE SPASMS 10/12/14   Lauree Chandler, NP  Cholecalciferol (VITAMIN D3) 2000 UNITS TABS Take by mouth daily.    Historical Provider, MD  clopidogrel (PLAVIX) 75 MG tablet TAKE 1 TABLET BY MOUTH EVERY DAY 08/08/14   Lauree Chandler, NP  fluconazole (DIFLUCAN) 150 MG tablet Take 1 tablet today and repeat in 3 days 11/20/14   Lauree Chandler, NP   FLUoxetine (PROZAC) 20 MG capsule Take 3 capsules (60 mg total) by mouth daily at 12 noon. 12/29/13   Norma Fredrickson, MD  fluticasone (FLONASE) 50 MCG/ACT nasal spray Place 2 sprays  into both nostrils daily.    Historical Provider, MD  gabapentin (NEURONTIN) 300 MG capsule TAKE 2 CAPSULES BY MOUTH THREE TIMES A DAY 11/12/14   Lauree Chandler, NP  glucose blood (ONETOUCH VERIO) test strip 1 each by Other route 3 (three) times daily. E11.29    Historical Provider, MD  ibuprofen (ADVIL,MOTRIN) 600 MG tablet TAKE 1 TABLET BY MOUTH EVERY 8 HOURS AS NEEDED FOR PAIN 10/12/14   Lauree Chandler, NP  Insulin Glargine (TOUJEO SOLOSTAR) 300 UNIT/ML SOPN Inject 96 Units into the skin daily. 09/05/14   Lauree Chandler, NP  Insulin Glargine (TOUJEO SOLOSTAR) 300 UNIT/ML SOPN Inject 90 Units into the skin daily. 11/20/14   Lauree Chandler, NP  Insulin Lispro, Human, (HUMALOG KWIKPEN) 200 UNIT/ML SOPN Inject 5 Units into the skin 3 (three) times daily with meals. 03/15/14   Lauree Chandler, NP  lisinopril (PRINIVIL,ZESTRIL) 40 MG tablet Take 1 tablet (40 mg total) by mouth daily. 01/16/14   Mahima Bubba Camp, MD  MYRBETRIQ 25 MG TB24 tablet TAKE 1 TABLET BY MOUTH ONCE DAILY FOR BLADDER 11/12/14   Lauree Chandler, NP  ondansetron (ZOFRAN ODT) 4 MG disintegrating tablet Take 1 tablet (4 mg total) by mouth every 8 (eight) hours as needed for nausea or vomiting. 06/18/14   Junius Creamer, NP  pantoprazole (PROTONIX) 40 MG tablet TAKE 1 TABLET BY MOUTH DAILY 11/12/14   Lauree Chandler, NP  polyethylene glycol [facility]) packet Take 17 g by mouth daily. 03/01/14   Modena Jansky, MD  potassium chloride SA (K-DUR,KLOR-CON) 20 MEQ tablet Take 2 tablets (40 mEq total) by mouth daily. 06/18/14   Junius Creamer, NP  PROAIR HFA 108 (90 BASE) MCG/ACT inhaler INHALE 2 PUFFS BY MOUTH EVERY 6 HOURS AS NEEDED FOR WHEEZING OR SHORTNESS OF BREATH 10/12/14   Lauree Chandler, NP  ranitidine (ZANTAC) 150 MG tablet TAKE 1 TABLET BY MOUTH  TWICE DAILY 11/12/14   Lauree Chandler, NP  simvastatin (ZOCOR) 20 MG tablet TAKE 1 TABLET BY MOUTH EVERY MORNING FOR CHOLESTROL 10/12/14   Lauree Chandler, NP  SYMBICORT 160-4.5 MCG/ACT inhaler INHALE 2 PUFFS BY MOUTH TWICE DAILY 11/12/14   Lauree Chandler, NP  topiramate (TOPAMAX) 200 MG tablet TAKE 1 TABLET BY MOUTH TWICE DAILY 09/14/14   Lauree Chandler, NP  traZODone (DESYREL) 150 MG tablet Take 150 mg by mouth 2 (two) times daily.  05/29/14   Lauree Chandler, NP  verapamil (VERELAN PM) 240 MG 24 hr capsule TAKE 1 CAPSULE BY MOUTH EVERY NIGHT AT BEDTIME FOR BLOOD PRESSURE 09/14/14   Lauree Chandler, NP   BP 158/95 mmHg  Pulse 77  Temp(Src) 98.3 F (36.8 C) (Oral)  Resp 20  SpO2 99% Physical Exam  Constitutional: She is oriented to person, place, and time. She appears well-developed and well-nourished. No distress.  HENT:  Head: Normocephalic and atraumatic.  Mouth/Throat: Oropharynx is clear and moist.  Eyes: Conjunctivae are normal. Pupils are equal, round, and reactive to light.  Strabismus in the right eye  Neck: Normal range of motion. Neck supple.  Cardiovascular: Normal rate, regular rhythm and intact distal pulses.   No murmur heard. Pulmonary/Chest: Effort normal and breath sounds normal. No respiratory distress. She has no wheezes. She has no rales.  Abdominal: Soft. She exhibits no distension. There is no tenderness. There is no rebound and no guarding.  Musculoskeletal: Normal range of motion. She exhibits no edema or tenderness.  Neurological: She is alert  and oriented to person, place, and time. No sensory deficit. Gait normal.  Patient is able to ambulate without difficulties. Speech is clear and concise. No swallowing deficits  Skin: Skin is warm and dry. No rash noted. No erythema.  Psychiatric: She has a normal mood and affect. Her behavior is normal.  Nursing note and vitals reviewed.   ED Course  Procedures (including critical care time) Labs  Review Labs Reviewed  CBC WITH DIFFERENTIAL/PLATELET - Abnormal; Notable for the following:    Hemoglobin 11.5 (*)    All other components within normal limits  I-STAT CHEM 8, ED - Abnormal; Notable for the following:    Creatinine, Ser 1.10 (*)    Calcium, Ion 1.24 (*)    All other components within normal limits  I-STAT TROPOININ, ED    Imaging Review Dg Chest 2 View  11/27/2014  CLINICAL DATA:  Shortness of breath, chest pain, left arm pain, and headache, elevated blood pressure despite medication, history of diabetes, previous MI, COPD. EXAM: CHEST  2 VIEW COMPARISON:  PA and lateral chest x-ray of Jun 16, 2013 FINDINGS: The lungs are adequately inflated. There is no focal infiltrate. There is no pleural effusion. The heart and pulmonary vascularity are normal. The mediastinum is normal in width. There is mild tortuosity of the descending thoracic aorta. The bony thorax exhibits no acute abnormality. IMPRESSION: There is no active cardiopulmonary disease. Electronically Signed   By: David  Martinique M.D.   On: 11/27/2014 13:00   I have personally reviewed and evaluated these images and lab results as part of my medical decision-making.   EKG Interpretation   Date/Time:  Tuesday November 27 2014 12:02:09 EST Ventricular Rate:  67 PR Interval:  184 QRS Duration: 73 QT Interval:  398 QTC Calculation: 420 R Axis:   33 Text Interpretation:  Sinus rhythm Consider left atrial enlargement No  significant change since last tracing Confirmed by Maryan Rued  MD, Loree Fee  [phone]) on 11/27/2014 12:07:38 PM      MDM   Final diagnoses:  Essential hypertension    Patient is a 49 year old female with multiple medical problems who presents today for hypertension. On Friday home health nurse came in her blood pressure was elevated and it remained elevated today at rehabilitation and they told her to come here to be checked out. Patient states her blood pressure has remained high despite being on  lisinopril and verapamil but usually the diastolic numbers in the 35W. When asked if she had new headaches, chest pain, shortness of breath or new numbness and weakness she states that she does not think there is anything new but it is hard to tell. No focal exam findings.  Initial blood pressure taken here was 141/89 with repeat check of 164/104 and then 158/95. Patient has not missed any doses of her medication and has taken her medication this morning.  Given vague story and a recent hospital admission for severe pyelonephritis and stents placed will check renal function, troponin, EKG and CBC to ensure no other cause for patient's hypertension  1:58 PM Labs EKG and chest x-ray without acute findings. Patient's repeat blood sugar now is 138/81. Recommended that she continue her current medications and follow-up with her PCP if blood pressure continues to be elevated at home.  Blanchie Dessert, MD 11/27/14 1358

## 2014-11-28 ENCOUNTER — Telehealth: Payer: Self-pay

## 2014-11-28 NOTE — Telephone Encounter (Signed)
Right now patient is living with her daughter and she helps her some. When patient moves she will not have the additional help of her daughter.

## 2014-11-28 NOTE — Telephone Encounter (Signed)
Why does she need more Home Health?

## 2014-11-28 NOTE — Telephone Encounter (Signed)
Patient called requesting a letter stating that she needs more home health hours per month. Patient is currently getting 48 hours monthly through [facility]. Patient will be moving on her own (currently lives with daughter) in the near future (date not set right now). Prior to moving patient would like to know that she will have more assistance for her daughter feels that its not safe for her to be alone.   I called [place] community services @ [phone] to request fax number for letter/statement to be faxed if approved by Burna Sis  Please advise

## 2014-11-29 ENCOUNTER — Encounter: Payer: Self-pay | Admitting: Occupational Therapy

## 2014-11-29 ENCOUNTER — Ambulatory Visit: Payer: Medicare Other | Admitting: Rehabilitation

## 2014-11-29 ENCOUNTER — Encounter: Payer: Self-pay | Admitting: Rehabilitation

## 2014-11-29 ENCOUNTER — Ambulatory Visit: Payer: Medicare Other | Admitting: Occupational Therapy

## 2014-11-29 DIAGNOSIS — R269 Unspecified abnormalities of gait and mobility: Secondary | ICD-10-CM

## 2014-11-29 DIAGNOSIS — I69952 Hemiplegia and hemiparesis following unspecified cerebrovascular disease affecting left dominant side: Secondary | ICD-10-CM

## 2014-11-29 DIAGNOSIS — R531 Weakness: Secondary | ICD-10-CM | POA: Diagnosis not present

## 2014-11-29 DIAGNOSIS — R2681 Unsteadiness on feet: Secondary | ICD-10-CM

## 2014-11-29 DIAGNOSIS — M6281 Muscle weakness (generalized): Secondary | ICD-10-CM

## 2014-11-29 NOTE — Therapy (Signed)
[facility] [address] [place], [place], [address] Phone: [phone]   Fax:  [phone]  Physical Therapy Treatment and D/C Summary  Patient Details  Name: [name] MRN: [id number] Date of Birth: 1965-02-28 No Data Recorded  Encounter Date: 11/29/2014      PT End of Session - 11/29/14 1242    Visit Number 10   Number of Visits 17   Date for PT Re-Evaluation 12/16/14   Authorization Type UHC MCR, MCD secondary, G codes due to every 10th visit   PT Start Time 1017   PT Stop Time 1055   PT Time Calculation (min) 38 min   Activity Tolerance Patient tolerated treatment well   Behavior During Therapy [facility] for tasks assessed/performed      Past Medical History  Diagnosis Date  . Hypertension   . Asthma   . Seizures ([place])   . Heart murmur     born with   . Dysrhythmia     skipped beats  . Myocardial infarction ([place])     hi of x 2  last one 2003   . COPD (chronic obstructive pulmonary disease) (HCC)     chronic bronchitis   . Pneumonia     hx of 2009  . GERD (gastroesophageal reflux disease)   . Headache(784.0)     migraines   . Neuropathy ([place])     due to diabetes   . Dizziness     secondary to diabetes and hypertension   . Arthritis     joint pain   . Hx of blood clots     hematomas removed from left side of brain from 55mo to 89yrold   . Epilepsy idiopathic petit mal ([place]    last seizure 2012;controlled w/ topomax  . Diabetic neuropathy, painful ([place]    FEET AND HANDS  . Diabetes mellitus     since age [age]type 2 IDDM  . Sleep apnea     [facility];does not use Cpap ; mild  . MIGRAINE HEADACHE 09/14/2006    Qualifier: Diagnosis of  By: MaHassell DoneNP, NyTori Milks  . Glaucoma     NOT ON ANY EYE DROPS   . Legally blind     left eye   [facility]    [facility]  RESIDUAL LEFT LEG WEAKNESS--NO OTHER RESIDUAL PROBLEMS  . CVA 11/14/2007    Qualifier: Diagnosis of   By: MaHassell DoneNP, NyTori Milks  . Pain 09/23/11    LOWER BACK AND RIGHT SIDE--PT HAS RIGHT URETERAL STONE  . Nephrolithiasis     frequent urination , urination at nite  PT SEEN IN ER 09/22/11 FOR BACK AND RT SIDED PAIN--HAS KNOWN STONE RT URETER AND UA IN ER SHOWED UTI  . Anxiety   . Depression   . Stress incontinence   . Hyperlipidemia   . Pyelonephritis     Past Surgical History  Procedure Laterality Date  . Kidney stone removal    . Brain hematoma evacuation      five procedures total, first procedure when [age]onths old, last at [age]ears of age.  . Shunt removal      shunt inserted at age [age]emoved at age 49 . Eye surgery    . Retinal detachment surgery  1990  . Vaginal hysterectomy  1996  . Incisional hernia repair  05/05/2011    Procedure: LAPAROSCOPIC INCISIONAL HERNIA;  Surgeon:  Rolm Bookbinder, MD;  Location: [place];  Service: General;  Laterality: N/A;  . Mass excision  05/05/2011    Procedure: EXCISION MASS;  Surgeon: Rolm Bookbinder, MD;  Location: [facility];  Service: General;  Laterality: Right;  . Hernia repair  05/05/11    lap incisional hernia  . Pcnl    . Cystoscopy/retrograde/ureteroscopy  08/24/2011    Procedure: CYSTOSCOPY/RETROGRADE/URETEROSCOPY;  Surgeon: Molli Hazard, MD;  [facility];  Service: Urology;  Laterality: Right;  Cysto, Right retrograde Pyelogram, right stent placement.   . Cystoscopy with ureteroscopy  09/24/2011    Procedure: CYSTOSCOPY WITH URETEROSCOPY;  Surgeon: Malka So, MD;  [facility];  Service: Urology;  Laterality: Right;  Balloon dilation right ureter   . Cystoscopy w/ retrogrades  09/24/2011    Procedure: CYSTOSCOPY WITH RETROGRADE PYELOGRAM;  Surgeon: Malka So, MD;  [facility];  Service: Urology;  Laterality: Right;    There were no vitals filed for this visit.  Visit Diagnosis:  Unsteadiness  Abnormality of gait  Generalized weakness      Subjective Assessment - 11/29/14 1241    Subjective "Am I almost  done here."    Limitations Standing;Walking;House hold activities   Patient Stated Goals To get rid of this pain, to fall less   Currently in Pain? Yes   Pain Score 7    Pain Location Arm   Pain Orientation Left   Pain Descriptors / Indicators Aching;Burning   Pain Type Chronic pain   Pain Onset More than a month ago   Pain Frequency Constant   Aggravating Factors  certain movements   Pain Relieving Factors heat, rest, meds            [facility] PT Assessment - 11/29/14 1043    Functional Gait  Assessment   Gait assessed  Yes   Gait Level Surface Walks 20 ft in less than 7 sec but greater than 5.5 sec, uses assistive device, slower speed, mild gait deviations, or deviates 6-10 in outside of the 12 in walkway width.   Change in Gait Speed Able to smoothly change walking speed without loss of balance or gait deviation. Deviate no more than 6 in outside of the 12 in walkway width.   Gait with Horizontal Head Turns Performs head turns smoothly with no change in gait. Deviates no more than 6 in outside 12 in walkway width   Gait with Vertical Head Turns Performs head turns with no change in gait. Deviates no more than 6 in outside 12 in walkway width.   Gait and Pivot Turn Pivot turns safely in greater than 3 sec and stops with no loss of balance, or pivot turns safely within 3 sec and stops with mild imbalance, requires small steps to catch balance.   Step Over Obstacle Is able to step over one shoe box (4.5 in total height) without changing gait speed. No evidence of imbalance.   Gait with Narrow Base of Support Ambulates 4-7 steps.   Gait with Eyes Closed Walks 20 ft, uses assistive device, slower speed, mild gait deviations, deviates 6-10 in outside 12 in walkway width. Ambulates 20 ft in less than 9 sec but greater than 7 sec.   Ambulating Backwards Walks 20 ft, uses assistive device, slower speed, mild gait deviations, deviates 6-10 in outside 12 in walkway width.   Steps Alternating feet,  no rail.   Total Score 23  Therex:  Pt performed all of HEP independently with use of handout.  Performed BLE bridging x 10 reps, posterior pelvic tilt x 10 reps, alternating marching in posterior pelvic tilt x 10 reps, squats (chair pose) x 10 reps, and 4 way hip with use of red theraband x 5 reps on each LE.    NMR:  Performed FGA with new score of 23/30, still indicative of medium fall risk, but also that pt has made functional improvement in balance and is a decreased fall risk from time of evaluation.  Educated pt on meaning of results and the fact that pt still at a fall risk.                 PT Education - 11/29/14 1241    Education provided Yes   Education Details Continued education on HEP compliance and initiation of community fitness program.     Person(s) Educated Patient   Methods Explanation   Comprehension Verbalized understanding          PT Short Term Goals - 11/12/14 1023    PT SHORT TERM GOAL #1   Title Pt will initiate HEP for balance and strengthening to increase functional strength and decrease fall risk.  (Target date: 11/14/14)   Baseline Pt reports performing 3x day 11/12/14   Time 4   Period Weeks   Status Achieved   PT SHORT TERM GOAL #2   Title Pt will increase FGA to 16/30 in order to indicate functional improvement in balance and decreased fall risk. (Target date: 11/14/14)   Baseline 18/30 11/12/14   Time 4   Period Weeks   Status Achieved   PT SHORT TERM GOAL #3   Title Pt will increase gait speed to 2.30 ft/sec in order to indicate decreased fall risk and improved efficiency of gait.  (Target date: 11/14/14)   Baseline 3.46 ft/sec on 11/12/14   PT SHORT TERM GOAL #4   Title Pt will verbalize CVA warning signs and risk factors independently in order to ensure pt understanding of when to seek medical care.  (Target date: 11/14/14)   Baseline met on 11/12/14   Time 4   Period Weeks   PT SHORT TERM GOAL #5   Title  Pt will ambulate >300' with LRAD at S level negotiating around and over obstacles without overt LOB to indicate safe home negotiation.    Baseline S with use of SPC x 300' 11/12/14   Status Achieved           PT Long Term Goals - 11/29/14 1023    PT LONG TERM GOAL #1   Title Pt will be independent with HEP to indicate compliance and increased strengthening and decreased fall risk.  (Target Date: 12/12/14)   Baseline met 11/29/14   Time 8   Period Weeks   Status Achieved   PT LONG TERM GOAL #2   Title Pt will increase FGA score to 21/30 in order to indicate decreased fall risk and improved balance.  (Target Date: 12/12/14)   Baseline 23/30 on 11/29/14   Time 8   Period Weeks   Status Achieved   PT LONG TERM GOAL #3   Title Pt will increase gait speed to 2.90 ft/sec in order to indicate decreased fall risk, return to community ambulation, and increased efficiency of gait. (Target Date: 12/12/14)   Baseline 3.51 ft/sec on 11/10   Time 8   Period Weeks   Status Achieved   PT LONG TERM GOAL #  4   Title Pt will ambulate >300' on outdoor surfaces including grass, curb and ramp without AD at mod I level in order to safely traverse in community.  (Target Date: 12/12/14)   Baseline mod I 11/22/14   Time 8   Period Weeks   Status Achieved   PT LONG TERM GOAL #5   Title Pt will increase [place] Impact Scale score to 56.8% to indicate improved pt perceived functional mobility status.  (Target Date: 12/12/14)   Baseline 36.1% on 06-Dec-2014   Time 8   Period Weeks   Status Not Met               Plan - 06-Dec-2014 1243    Clinical Impression Statement Skilled session focused on assessment of LTG's for possible D/C.  Pt as met 4/5 LTG's, only one not met was SIS score actually went down.  Feel that pt may have misunderstood some of the wording as she states "No, I feel like I'm doing much better."     Pt will benefit from skilled therapeutic intervention in order to improve on the following  deficits Abnormal gait;Decreased activity tolerance;Decreased balance;Decreased coordination;Decreased endurance;Decreased knowledge of precautions;Decreased knowledge of use of DME;Decreased mobility;Decreased safety awareness;Decreased strength;Difficulty walking;Impaired perceived functional ability;Impaired sensation;Decreased range of motion;Impaired UE functional use;Impaired vision/preception;Improper body mechanics;Postural dysfunction;Pain   Rehab Potential Good   Clinical Impairments Affecting Rehab Potential pt compliance with recommendations   PT Frequency 2x / week   PT Duration 8 weeks   PT Treatment/Interventions ADLs/Self Care Home Management;Electrical Stimulation;DME Instruction;Gait training;Stair training;Functional mobility training;Therapeutic activities;Therapeutic exercise;Balance training;Neuromuscular re-education;Patient/family education;Orthotic Fit/Training;Visual/perceptual remediation/compensation   PT Next Visit Plan high level balance, core strengthening   Consulted and Agree with Plan of Care Patient          G-Codes - Dec 06, 2014 1048    Functional Assessment Tool Used 23/30 FGA   Functional Limitation Mobility: Walking and moving around   Mobility: Walking and Moving Around Current Status [phone]) At least 60 percent but less than 80 percent impaired, limited or restricted   Mobility: Walking and Moving Around Goal Status [phone]) At least 20 percent but less than 40 percent impaired, limited or restricted   Mobility: Walking and Moving Around Discharge Status [phone]) At least 20 percent but less than 40 percent impaired, limited or restricted      PHYSICAL THERAPY DISCHARGE SUMMARY  Visits from Start of Care: 10  Current functional level related to goals / functional outcomes: See LTG's   Remaining deficits: Pt still with high level balance deficits and pain in LUE.    Education / Equipment: HEP  Plan: Patient agrees to discharge.  Patient goals were  met. Patient is being discharged due to meeting the stated rehab goals.  ?????Pt did not meet single goal for SIS improvement.  See above.         Problem List Patient Active Problem List   Diagnosis Date Noted  . Sacroiliac dysfunction 10/09/2014  . Chronic low back pain 09/03/2014  . CVA, old, hemiparesis ([place]) 09/03/2014  . OSA (obstructive sleep apnea) 05/15/2014  . Restrictive airway disease 05/15/2014  . Colitis 02/22/2014  . Type 2 diabetes, uncontrolled, with renal manifestation ([place]) 01/21/2014  . Essential hypertension, benign 01/21/2014  . Chronic pain disorder 01/21/2014  . Migraine 09/07/2013  . DM type 2, uncontrolled, with neuropathy ([place]) 05/23/2013  . Mixed urge and stress incontinence 08/25/2011  . Ureteral stricture, right 08/25/2011  . Pyelonephritis 08/22/2011  . Nephrolithiasis 08/22/2011  .  Incisional hernia 05/10/2011  . Obesity, Class III, BMI 40-49.9 (morbid obesity) ([place]) 05/10/2011  . Tachycardia 05/08/2011  . DIABETES MELLITUS, TYPE II 09/30/2006  . BLINDNESS, [address], [place] DEFINITION 09/14/2006  . HYPERLIPIDEMIA 09/13/2006  . Anxiety and depression 09/13/2006  . Essential hypertension 09/13/2006  . MYOCARDIAL INFARCTION, HX OF 09/13/2006  . GERD 09/13/2006  . SEIZURE DISORDER 09/13/2006    Cameron Sprang, PT, MPT [facility] [address] [place], [place], [address] Phone: [phone]   Fax:  [phone] 11/29/2014, 12:47 PM  Name: [name] MRN: [id number] Date of Birth: [birth year]

## 2014-11-29 NOTE — Telephone Encounter (Signed)
[name] with [facility] called and left message for [place] regarding fax #: [phone].

## 2014-11-29 NOTE — Therapy (Signed)
[facility] [address] [place], [place], [address] Phone: [phone]   Fax:  [phone]  Occupational Therapy Treatment  Patient Details  Name: [name] MRN: [id number] Date of Birth: 1965-06-30 No Data Recorded  Encounter Date: 11/29/2014      OT End of Session - 11/29/14 1224    Visit Number 7   Number of Visits 8   Date for OT Re-Evaluation 12/25/14   Authorization Type medicare - will need g code   Authorization Time Period 60 days   OT Start Time 0850   OT Stop Time 0930   OT Time Calculation (min) 40 min   Activity Tolerance Patient tolerated treatment well      Past Medical History  Diagnosis Date  . Hypertension   . Asthma   . Seizures ([place])   . Heart murmur     born with   . Dysrhythmia     skipped beats  . Myocardial infarction ([place])     hi of x 2  last one 2003   . COPD (chronic obstructive pulmonary disease) (HCC)     chronic bronchitis   . Pneumonia     hx of 2009  . GERD (gastroesophageal reflux disease)   . Headache(784.0)     migraines   . Neuropathy ([place])     due to diabetes   . Dizziness     secondary to diabetes and hypertension   . Arthritis     joint pain   . Hx of blood clots     hematomas removed from left side of brain from 50mo to 89yrold   . Epilepsy idiopathic petit mal ([place]    last seizure 2012;controlled w/ topomax  . Diabetic neuropathy, painful ([place]    FEET AND HANDS  . Diabetes mellitus     since age [age]type 2 IDDM  . Sleep apnea     [facility];does not use Cpap ; mild  . MIGRAINE HEADACHE 09/14/2006    Qualifier: Diagnosis of  By: MaHassell DoneNP, NyTori Milks  . Glaucoma     NOT ON ANY EYE DROPS   . Legally blind     left eye   [facility]    [facility]  RESIDUAL LEFT LEG WEAKNESS--NO OTHER RESIDUAL PROBLEMS  . CVA 11/14/2007    Qualifier: Diagnosis of  By: MaHassell DoneNP, NyTori Milks  . Pain 09/23/11    LOWER BACK AND  RIGHT SIDE--PT HAS RIGHT URETERAL STONE  . Nephrolithiasis     frequent urination , urination at nite  PT SEEN IN ER 09/22/11 FOR BACK AND RT SIDED PAIN--HAS KNOWN STONE RT URETER AND UA IN ER SHOWED UTI  . Anxiety   . Depression   . Stress incontinence   . Hyperlipidemia   . Pyelonephritis     Past Surgical History  Procedure Laterality Date  . Kidney stone removal    . Brain hematoma evacuation      five procedures total, first procedure when [age]onths old, last at [age]ears of age.  . Shunt removal      shunt inserted at age [age]emoved at age 49 . Eye surgery    . Retinal detachment surgery  1990  . Vaginal hysterectomy  1996  . Incisional hernia repair  05/05/2011    Procedure: LAPAROSCOPIC INCISIONAL HERNIA;  Surgeon: MaRolm BookbinderMD;  [facility] Service: General;  Laterality: N/A;  . Mass excision  05/05/2011    Procedure: EXCISION MASS;  Surgeon: Rolm Bookbinder, MD;  Location: [facility];  Service: General;  Laterality: Right;  . Hernia repair  05/05/11    lap incisional hernia  . Pcnl    . Cystoscopy/retrograde/ureteroscopy  08/24/2011    Procedure: CYSTOSCOPY/RETROGRADE/URETEROSCOPY;  Surgeon: Molli Hazard, MD;  [facility];  Service: Urology;  Laterality: Right;  Cysto, Right retrograde Pyelogram, right stent placement.   . Cystoscopy with ureteroscopy  09/24/2011    Procedure: CYSTOSCOPY WITH URETEROSCOPY;  Surgeon: Malka So, MD;  [facility];  Service: Urology;  Laterality: Right;  Balloon dilation right ureter   . Cystoscopy w/ retrogrades  09/24/2011    Procedure: CYSTOSCOPY WITH RETROGRADE PYELOGRAM;  Surgeon: Malka So, MD;  [facility];  Service: Urology;  Laterality: Right;    There were no vitals filed for this visit.  Visit Diagnosis:  Hemiplegia affecting left side in left-dominant patient as late effect of cerebrovascular disease ([place])  Muscle weakness      Subjective Assessment - 11/29/14 0852    Subjective  My BP was  normal this morning   Pertinent History see epic snapshot. Pt has had 8 CVA's from 2003-2013   Patient Stated Goals I want to be able to use my left arm more.  I never got OT after my last stroke   Currently in Pain? Yes   Pain Score 7    Pain Location Arm   Pain Orientation Left   Pain Descriptors / Indicators Aching;Burning   Pain Type Chronic pain   Pain Onset More than a month ago   Pain Frequency Constant   Aggravating Factors  certain movements   Pain Relieving Factors heat, rest, meds                      OT Treatments/Exercises (OP) - 11/29/14 0001    ADLs   Cooking Pt cooking spaghetti and cornbread I'ly but did recommend use of timer when cooking more than 1 item and also pulling out oven rack some prior to getting dish in/out of oven to prevent burns. Pt also did not follow directions completely on cornbread recipe but pt reports making her own cornbread from scratch, not from a box. Pt I'ly turned off stove without cues and cleaned dishes.                      OT Long Term Goals - 11/29/14 1224    OT LONG TERM GOAL #1   Title Pt will be mod I with HEP - 11/27/2014   Status Achieved   OT LONG TERM GOAL #2   Title Pt will demonstrate improved grip strength by 5 pounds in LUE (baseline = 45 pounds)   Status Not Met  11/22/14: 45 LBS   OT LONG TERM GOAL #3   Title Pt will be able to lift 3 pound object off overhead shelf with LUE x3 without dropping   Status Achieved   OT LONG TERM GOAL #4   Title Pt will verbalize understanding of AE for cutting, showering and toileting prn (pt plans to move into own apartment at some point)   Baseline t   Status Achieved   OT LONG TERM GOAL #5   Title Pt will be mod I with simple hot meal prep   Status Achieved  Plan - 11/29/14 1225    Clinical Impression Statement Pt met all LTG's except grip strength goal which remains the same. Pt demo good safety for simple cooking tasks and  independent with all self care needs.    Plan D/C O.Donnajean Lopes and Agree with Plan of Care Patient        Problem List Patient Active Problem List   Diagnosis Date Noted  . Sacroiliac dysfunction 10/09/2014  . Chronic low back pain 09/03/2014  . CVA, old, hemiparesis ([place]) 09/03/2014  . OSA (obstructive sleep apnea) 05/15/2014  . Restrictive airway disease 05/15/2014  . Colitis 02/22/2014  . Type 2 diabetes, uncontrolled, with renal manifestation ([place]) 01/21/2014  . Essential hypertension, benign 01/21/2014  . Chronic pain disorder 01/21/2014  . Migraine 09/07/2013  . DM type 2, uncontrolled, with neuropathy ([place]) 05/23/2013  . Mixed urge and stress incontinence 08/25/2011  . Ureteral stricture, right 08/25/2011  . Pyelonephritis 08/22/2011  . Nephrolithiasis 08/22/2011  . Incisional hernia 05/10/2011  . Obesity, Class III, BMI 40-49.9 (morbid obesity) ([place]) 05/10/2011  . Tachycardia 05/08/2011  . DIABETES MELLITUS, TYPE II 09/30/2006  . BLINDNESS, [place], [place] DEFINITION 09/14/2006  . HYPERLIPIDEMIA 09/13/2006  . Anxiety and depression 09/13/2006  . Essential hypertension 09/13/2006  . MYOCARDIAL INFARCTION, HX OF 09/13/2006  . GERD 09/13/2006  . SEIZURE DISORDER 09/13/2006     OCCUPATIONAL THERAPY DISCHARGE SUMMARY  Visits from Start of Care: 7  Current functional level related to goals / functional outcomes: SEE ABOVE   Remaining deficits: Strength/endurance   Education / Equipment: HEP's, A/E recommendations, DME recommendations and compensatory strategies for greater safety and independence.   Plan: Patient agrees to discharge.  Patient goals were met. Patient is being discharged due to meeting the stated rehab goals.  ?????       Carey Bullocks, OTR/L 11/29/2014, 12:26 PM  [facility] [address] [place], [place], [address] Phone: [phone]   Fax:  [phone]  Name:  LORALIE MALTA MRN: [id number] Date of Birth: 1965-12-06

## 2014-12-03 ENCOUNTER — Other Ambulatory Visit: Payer: Self-pay | Admitting: Nurse Practitioner

## 2014-12-04 ENCOUNTER — Ambulatory Visit: Payer: Self-pay | Admitting: Rehabilitation

## 2014-12-06 ENCOUNTER — Ambulatory Visit: Payer: Medicare Other | Admitting: Nurse Practitioner

## 2014-12-07 ENCOUNTER — Encounter: Payer: Medicare Other | Attending: Physical Medicine & Rehabilitation

## 2014-12-07 ENCOUNTER — Ambulatory Visit: Payer: Self-pay | Admitting: Rehabilitation

## 2014-12-07 ENCOUNTER — Ambulatory Visit: Payer: Self-pay | Admitting: Physical Medicine & Rehabilitation

## 2014-12-07 DIAGNOSIS — G8929 Other chronic pain: Secondary | ICD-10-CM | POA: Insufficient documentation

## 2014-12-07 DIAGNOSIS — I69354 Hemiplegia and hemiparesis following cerebral infarction affecting left non-dominant side: Secondary | ICD-10-CM | POA: Insufficient documentation

## 2014-12-07 DIAGNOSIS — M545 Low back pain: Secondary | ICD-10-CM | POA: Insufficient documentation

## 2014-12-07 DIAGNOSIS — E119 Type 2 diabetes mellitus without complications: Secondary | ICD-10-CM | POA: Insufficient documentation

## 2014-12-07 DIAGNOSIS — F329 Major depressive disorder, single episode, unspecified: Secondary | ICD-10-CM | POA: Insufficient documentation

## 2014-12-10 ENCOUNTER — Ambulatory Visit: Payer: Self-pay | Admitting: Rehabilitation

## 2014-12-10 ENCOUNTER — Telehealth: Payer: Self-pay | Admitting: Pulmonary Disease

## 2014-12-10 NOTE — Telephone Encounter (Signed)
LMTCB x1 I have also sent [name] a staff message

## 2014-12-10 NOTE — Telephone Encounter (Signed)
[name] sent message back stating she has pulled the note. Called made pt aware. She needed nothing further

## 2014-12-11 ENCOUNTER — Ambulatory Visit: Payer: Self-pay | Admitting: Rehabilitation

## 2014-12-12 ENCOUNTER — Other Ambulatory Visit: Payer: Medicare Other

## 2014-12-19 ENCOUNTER — Other Ambulatory Visit: Payer: Medicare Other

## 2014-12-19 DIAGNOSIS — E1140 Type 2 diabetes mellitus with diabetic neuropathy, unspecified: Secondary | ICD-10-CM

## 2014-12-19 DIAGNOSIS — E1165 Type 2 diabetes mellitus with hyperglycemia: Principal | ICD-10-CM

## 2014-12-19 DIAGNOSIS — IMO0002 Reserved for concepts with insufficient information to code with codable children: Secondary | ICD-10-CM

## 2014-12-20 LAB — BASIC METABOLIC PANEL
BUN/Creatinine Ratio: 20 (ref 9–23)
BUN: 17 mg/dL (ref 6–24)
CO2: 23 mmol/L (ref 18–29)
Calcium: 9.1 mg/dL (ref 8.7–10.2)
Chloride: 104 mmol/L (ref 97–106)
Creatinine, Ser: 0.86 mg/dL (ref 0.57–1.00)
GFR calc Af Amer: 91 mL/min/{1.73_m2} (ref >59)
GFR calc non Af Amer: 79 mL/min/{1.73_m2} (ref >59)
Glucose: 125 mg/dL — ABNORMAL HIGH (ref 65–99)
Potassium: 4 mmol/L (ref 3.5–5.2)
Sodium: 139 mmol/L (ref 136–144)

## 2014-12-20 LAB — HEMOGLOBIN A1C
Est. average glucose Bld gHb Est-mCnc: 160 mg/dL
Hgb A1c MFr Bld: 7.2 % — ABNORMAL HIGH (ref 4.8–5.6)

## 2014-12-25 ENCOUNTER — Encounter: Payer: Self-pay | Admitting: Nurse Practitioner

## 2014-12-25 ENCOUNTER — Ambulatory Visit (INDEPENDENT_AMBULATORY_CARE_PROVIDER_SITE_OTHER): Payer: Medicare Other | Admitting: Nurse Practitioner

## 2014-12-25 ENCOUNTER — Other Ambulatory Visit: Payer: Self-pay

## 2014-12-25 VITALS — BP 152/100 | HR 73 | Temp 98.4°F | Resp 20 | Ht 66.0 in | Wt 225.8 lb

## 2014-12-25 DIAGNOSIS — G8929 Other chronic pain: Secondary | ICD-10-CM | POA: Diagnosis not present

## 2014-12-25 DIAGNOSIS — E1165 Type 2 diabetes mellitus with hyperglycemia: Secondary | ICD-10-CM

## 2014-12-25 DIAGNOSIS — Z1231 Encounter for screening mammogram for malignant neoplasm of breast: Secondary | ICD-10-CM

## 2014-12-25 DIAGNOSIS — IMO0002 Reserved for concepts with insufficient information to code with codable children: Secondary | ICD-10-CM

## 2014-12-25 DIAGNOSIS — M545 Low back pain, unspecified: Secondary | ICD-10-CM

## 2014-12-25 DIAGNOSIS — I10 Essential (primary) hypertension: Secondary | ICD-10-CM | POA: Diagnosis not present

## 2014-12-25 DIAGNOSIS — E876 Hypokalemia: Secondary | ICD-10-CM

## 2014-12-25 DIAGNOSIS — E1140 Type 2 diabetes mellitus with diabetic neuropathy, unspecified: Secondary | ICD-10-CM | POA: Diagnosis not present

## 2014-12-25 MED ORDER — HYDRALAZINE HCL 25 MG PO TABS: 25.0000 mg | ORAL_TABLET | Freq: Three times a day (TID) | ORAL | Status: DC

## 2014-12-25 MED ORDER — POTASSIUM CHLORIDE CRYS ER 20 MEQ PO TBCR: 40.0000 meq | EXTENDED_RELEASE_TABLET | Freq: Every day | ORAL | Status: DC

## 2014-12-25 NOTE — Patient Instructions (Addendum)
Start hydralazine 25 mg 3 times daily  Take blood pressure daily 30 mins-1 hour after medication has been taken Record blood pressure  and bring to next visit  Follow up in 2 weeks with blood pressure readings  Will get records from therapy

## 2014-12-25 NOTE — Addendum Note (Signed)
Addended by: Lauree Chandler on: 12/25/2014 04:10 PM   Modules accepted: Orders

## 2014-12-25 NOTE — Progress Notes (Signed)
Patient ID: [name], female   DOB: Jul 10, 1965, 50 y.o.   MRN: [id number]    PCP: Lauree Chandler, NP  Advanced Directive information Does patient have an advance directive?: Yes  Allergies  Allergen Reactions  . Codeine Swelling and Other (See Comments)    Swelling and burning of mouth (inside)  . Lactose Intolerance (Gi) Nausea And Vomiting    Chief Complaint  Patient presents with  . Medical Management of Chronic Issues    Wants to Discuss a live in  care giver, she needs a letter for housing  . OTHER    Need to know if okay to give Prevnar 13 with blood pressure so high     HPI: Patient is a 49 y.o. female seen in the office today for multiple issues.  Moving into public housing and would like a caregiver.  Blood pressure 140-170/80-120 Reports compliance with medication, no anxiety or worrying about anything. Low sodium diet. Taking lisinopril 40 mg daily and Verapamil 240 mg daily   Having a hard time getting her potassium prescription from the pharmacy- would like it to be sent to different pharmacy.   Blood sugars ranging from 70-150. conts to work on diet.   Has a nursing aid that helps her cooks, bath and dress. Moving to a different county and unable to get this assistance in this county. Does not know what agency she works through. [place] needs a letter stating pt needs a caregiver to help her with these things.  Pt reports she physically can not get out of bed or shower when she is in pain due to chronic back pain and neuropathy.  Just completed physical therapy, [facility] [phone] [address]) which recommended care.  Can not see to drive uses medical transportation.  Friends and family will help her with groceries. Can make small meals.  Daughter does most of cooking and cleaning and questions how much she can do. Pt wants to live on her own but understands if she can not have someone she will need to move to assisted living home.     Pt does her own medications.   Review of Systems:  Review of Systems  Constitutional: Negative for activity change, appetite change, fatigue and unexpected weight change.  Respiratory: Negative for cough and shortness of breath.        Hx of sleep apnea has CPAP  Cardiovascular: Negative for chest pain, palpitations and leg swelling.  Gastrointestinal: Negative for nausea, abdominal pain, diarrhea, constipation and abdominal distention.  Genitourinary: Negative for dysuria, urgency, frequency and flank pain.  Musculoskeletal: Positive for myalgias and back pain.       Chronic pain- well controlled at this time  Skin: Negative for rash.  Neurological: Negative for dizziness, seizures (no seizures since 2014), weakness and headaches.  Psychiatric/Behavioral: Negative for suicidal ideas and hallucinations.    Past Medical History  Diagnosis Date  . Hypertension   . Asthma   . Seizures ([place])   . Heart murmur     born with   . Dysrhythmia     skipped beats  . Myocardial infarction ([place])     hi of x 2  last one 2003   . COPD (chronic obstructive pulmonary disease) (HCC)     chronic bronchitis   . Pneumonia     hx of 2009  . GERD (gastroesophageal reflux disease)   . Headache(784.0)     migraines   . Neuropathy ([place])  due to diabetes   . Dizziness     secondary to diabetes and hypertension   . Arthritis     joint pain   . Hx of blood clots     hematomas removed from left side of brain from 75mo to 89yrold   . 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 Diabetic neuropathy, painful ([facility]    FEET AND HANDS  . Diabetes mellitus     since age [age]type 2 IDDM  . Sleep apnea     [facility];does not use Cpap ; mild  . MIGRAINE HEADACHE 09/14/2006    Qualifier: Diagnosis of  By: MaHassell DoneNP, NyTori Milks  . Glaucoma     NOT ON ANY EYE DROPS   . Legally blind     left eye   . [facility]    [facility]   RESIDUAL LEFT LEG WEAKNESS--NO OTHER RESIDUAL PROBLEMS  . CVA 11/14/2007    Qualifier: Diagnosis of  By: MaHassell DoneNP, NyTori Milks  . Pain 09/23/11    LOWER BACK AND RIGHT SIDE--PT HAS RIGHT URETERAL STONE  . Nephrolithiasis     frequent urination , urination at nite  PT SEEN IN ER 09/22/11 FOR BACK AND RT SIDED PAIN--HAS KNOWN STONE RT URETER AND UA IN ER SHOWED UTI  . Anxiety   . Depression   . Stress incontinence   . Hyperlipidemia   . Pyelonephritis    Past Surgical History  Procedure Laterality Date  . Kidney stone removal    . Brain hematoma evacuation      five procedures total, first procedure when [age]ths old, last at [age]ears of age.  . Shunt removal      shunt inserted at age [age]emoved at age 49 . Eye surgery    . Retinal detachment surgery  1990  . Vaginal hysterectomy  1996  . Incisional hernia repair  05/05/2011    Procedure: LAPAROSCOPIC INCISIONAL HERNIA;  Surgeon: MaRolm BookbinderMD;  [facility] Service: General;  Laterality: N/A;  . Mass excision  05/05/2011    Procedure: EXCISION MASS;  Surgeon: MaRolm BookbinderMD;  [place] [facility] Service: General;  Laterality: Right;  . Hernia repair  05/05/11    lap incisional hernia  . Pcnl    . Cystoscopy/retrograde/ureteroscopy  08/24/2011    Procedure: CYSTOSCOPY/RETROGRADE/URETEROSCOPY;  Surgeon: DaMolli HazardMD;  [facility];  Service: Urology;  Laterality: Right;  Cysto, Right retrograde Pyelogram, right stent placement.   . Cystoscopy with ureteroscopy  09/24/2011    Procedure: CYSTOSCOPY WITH URETEROSCOPY;  Surgeon: JoMalka SoMD;  [facility];  Service: Urology;  Laterality: Right;  Balloon dilation right ureter   . Cystoscopy w/ retrogrades  09/24/2011    Procedure: CYSTOSCOPY WITH RETROGRADE PYELOGRAM;  Surgeon: JoMalka SoMD;  [facility];  Service: Urology;  Laterality: Right;   Social History:   reports that she quit smoking about 27 years ago. Her smoking use included  Cigarettes. She has a 30 pack-year smoking history. She has never used smokeless tobacco. She reports that she drinks alcohol. She reports that she does not use illicit drugs.  Family History  Problem Relation Age of Onset  . Cancer Mother     uterine  . Hypertension Mother   . Cancer Maternal Grandmother     brain  . Hypertension  Maternal Grandmother   . Anesthesia problems Neg Hx     Medications: Patient's Medications  New Prescriptions   No medications on file  Previous Medications   BACLOFEN (LIORESAL) 10 MG TABLET    TAKE 1 TABLET BY MOUTH THREE TIMES A DAY AS NEEDED FOR MUSCLE SPASMS   CLOPIDOGREL (PLAVIX) 75 MG TABLET    TAKE 1 TABLET BY MOUTH EVERY DAY   FLUOXETINE (PROZAC) 20 MG CAPSULE    Take 3 capsules (60 mg total) by mouth daily at 12 noon.   FLUTICASONE (FLONASE) 50 MCG/ACT NASAL SPRAY    Place 2 sprays into both nostrils daily.   GABAPENTIN (NEURONTIN) 300 MG CAPSULE    TAKE 2 CAPSULES BY MOUTH THREE TIMES A DAY   GLUCOSE BLOOD (ONETOUCH VERIO) TEST STRIP    1 each by Other route 3 (three) times daily. E11.29   IBUPROFEN (ADVIL,MOTRIN) 600 MG TABLET    TAKE 1 TABLET BY MOUTH EVERY 8 HOURS AS NEEDED FOR PAIN   INSULIN GLARGINE (TOUJEO SOLOSTAR) 300 UNIT/ML SOPN    Inject 96 Units into the skin daily.   INSULIN LISPRO, HUMAN, (HUMALOG KWIKPEN) 200 UNIT/ML SOPN    Inject 5 Units into the skin 3 (three) times daily with meals.   LISINOPRIL (PRINIVIL,ZESTRIL) 40 MG TABLET    Take 1 tablet (40 mg total) by mouth daily.   MYRBETRIQ 25 MG TB24 TABLET    TAKE 1 TABLET BY MOUTH ONCE DAILY FOR BLADDER   ONDANSETRON (ZOFRAN ODT) 4 MG DISINTEGRATING TABLET    Take 1 tablet (4 mg total) by mouth every 8 (eight) hours as needed for nausea or vomiting.   PANTOPRAZOLE (PROTONIX) 40 MG TABLET    TAKE 1 TABLET BY MOUTH DAILY   POLYETHYLENE GLYCOL (MIRALAX) PACKET    Take 17 g by mouth daily.   POTASSIUM CHLORIDE SA (K-DUR,KLOR-CON) 20 MEQ TABLET    Take 2 tablets (40 mEq total) by mouth  daily.   PROAIR HFA 108 (90 BASE) MCG/ACT INHALER    INHALE 2 PUFFS BY MOUTH EVERY 6 HOURS AS NEEDED FOR WHEEZING OR SHORTNESS OF BREATH   RANITIDINE (ZANTAC) 150 MG TABLET    TAKE 1 TABLET BY MOUTH TWICE DAILY   SIMVASTATIN (ZOCOR) 20 MG TABLET    TAKE 1 TABLET BY MOUTH EVERY MORNING FOR CHOLESTROL   SYMBICORT 160-4.5 MCG/ACT INHALER    INHALE 2 PUFFS BY MOUTH TWICE DAILY   TOPIRAMATE (TOPAMAX) 200 MG TABLET    TAKE 1 TABLET BY MOUTH TWICE DAILY   VERAPAMIL (VERELAN PM) 240 MG 24 HR CAPSULE    TAKE 1 CAPSULE BY MOUTH EVERY NIGHT AT BEDTIME FOR BLOOD PRESSURE  Modified Medications   No medications on file  Discontinued Medications   ACYCLOVIR (ZOVIRAX) 800 MG TABLET    Take 1 tablet (800 mg total) by mouth 3 (three) times daily.   ACYCLOVIR (ZOVIRAX) 800 MG TABLET    Take 0.5 tablets (400 mg total) by mouth 3 (three) times daily.   FLUCONAZOLE (DIFLUCAN) 150 MG TABLET    Take 1 tablet today and repeat in 3 days   LISINOPRIL (PRINIVIL,ZESTRIL) 10 MG TABLET    TAKE 1 TABLET BY MOUTH EVERY DAY     Physical Exam:  Filed Vitals:   12/25/14 1431  BP: 152/100  Pulse: 73  Temp: 98.4 F (36.9 C)  TempSrc: Oral  Resp: 20  Height: 5' 6"  (1.676 m)  Weight: 225 lb 12.8 oz (102.422 kg)  SpO2: 98%  Body mass index is 36.46 kg/(m^2).  Physical Exam  Constitutional: She is oriented to person, place, and time. She appears well-developed and well-nourished. No distress.  HENT:  Head: Normocephalic and atraumatic.  Mouth/Throat: Oropharynx is clear and moist. No oropharyngeal exudate.  Eyes: Pupils are equal, round, and reactive to light.  Neck: Normal range of motion. Neck supple.  Cardiovascular: Normal rate, regular rhythm and normal heart sounds.   Pulmonary/Chest: Effort normal and breath sounds normal.  Abdominal: Soft. Bowel sounds are normal. She exhibits no distension. There is no tenderness.  Musculoskeletal: Normal range of motion. She exhibits tenderness (lumbar spine). She  exhibits no edema.  Neurological: She is alert and oriented to person, place, and time.  Skin: Skin is warm and dry. She is not diaphoretic.  Psychiatric: She has a normal mood and affect.    Labs reviewed: Basic Metabolic Panel:  Recent Labs  06/21/14 1535 09/04/14 1030 11/27/14 1219 12/19/14 0915  NA 142 142 141 139  K 4.0 3.9 4.0 4.0  CL 105 104 106 104  CO2 23 22  --  23  GLUCOSE 193* 120* 96 125*  BUN 16 13 20 17   CREATININE 1.09* 1.07* 1.10* 0.86  CALCIUM 9.1 9.1  --  9.1   Liver Function Tests:  Recent Labs  02/23/14 0530 06/18/14 0001 09/04/14 1030  AST 14 11* 9  ALT 13 12* 7  ALKPHOS 69 74 66  BILITOT 0.4 <0.1* <0.2  PROT 6.4 7.3 7.1  ALBUMIN 3.0* 3.5 3.9    Recent Labs  06/18/14 0001  LIPASE 22   No results for input(s): AMMONIA in the last 8760 hours. CBC:  Recent Labs  03/15/14 1524 06/18/14 0001 09/04/14 1030 11/27/14 1210 11/27/14 1219  WBC 4.8 5.7 5.6 5.8  --   NEUTROABS 2.4 5.2 3.8 3.6  --   HGB 13.0 11.3*  --  11.5* 12.6  HCT 39.7 36.1 36.5 36.2 37.0  MCV 91 91.9  --  92.1  --   PLT 285 202  --  254  --    Lipid Panel: No results for input(s): CHOL, HDL, LDLCALC, TRIG, CHOLHDL, LDLDIRECT in the last 8760 hours. TSH: No results for input(s): TSH in the last 8760 hours. A1C: Lab Results  Component Value Date   HGBA1C 7.2* 12/19/2014     Assessment/Plan 1. Essential hypertension, benign -elevated blood pressure, cont diet modifications with low sodium foods -cont on lisinopril 40 mg daily and verapamil Will add apresoline three times daily.  -pt to take blood pressure and record and bring to follow up visit - hydrALAZINE (APRESOLINE) 25 MG tablet; Take 1 tablet (25 mg total) by mouth 3 (three) times daily.  Dispense: 90 tablet; Refill: 0  2. DM type 2, uncontrolled, with neuropathy (HCC) -A1c improved to 7.2, to cont toujeo at 96 units and humalog with meals -encouraged diet modifications   3. Chronic low back  pain -completed PT/OT per pain clinic who recommended pt with increased assistance for when she moves. Pt moves around without difficulty in clinic without signs of pain. Will get these records prior to writing housing.  4. Hypokalemia -labs reviewed with pt and stable, cont potassium supplement.  - potassium chloride SA (K-DUR,KLOR-CON) 20 MEQ tablet; Take 2 tablets (40 mEq total) by mouth daily.  Dispense: 6 tablet; Refill: 3   Danyia Borunda K. Harle Battiest  [facility] [phone] 8 am - 5 pm) [phone] (after hours)

## 2014-12-31 ENCOUNTER — Other Ambulatory Visit: Payer: Self-pay | Admitting: Nurse Practitioner

## 2015-01-03 NOTE — Telephone Encounter (Signed)
Patient was seen on 12/25/14

## 2015-01-08 ENCOUNTER — Ambulatory Visit (INDEPENDENT_AMBULATORY_CARE_PROVIDER_SITE_OTHER): Payer: Medicare Other | Admitting: Nurse Practitioner

## 2015-01-08 ENCOUNTER — Encounter: Payer: Self-pay | Admitting: Nurse Practitioner

## 2015-01-08 VITALS — BP 132/86 | HR 82 | Temp 98.2°F | Ht 66.0 in | Wt 225.6 lb

## 2015-01-08 DIAGNOSIS — I10 Essential (primary) hypertension: Secondary | ICD-10-CM | POA: Diagnosis not present

## 2015-01-08 MED ORDER — LISINOPRIL 10 MG PO TABS: 10.0000 mg | ORAL_TABLET | Freq: Every day | ORAL | Status: DC

## 2015-01-08 MED ORDER — HYDRALAZINE HCL 25 MG PO TABS: 25.0000 mg | ORAL_TABLET | Freq: Three times a day (TID) | ORAL | Status: DC

## 2015-01-08 NOTE — Progress Notes (Signed)
Patient ID: [name], female   DOB: July 25, 1965, 49 y.o.   MRN: [id number]    PCP: Lauree Chandler, NP    Allergies  Allergen Reactions  . Codeine Swelling and Other (See Comments)    Swelling and burning of mouth (inside)  . Lactose Intolerance (Gi) Nausea And Vomiting    Chief Complaint  Patient presents with  . Medical Management of Chronic Issues    Medical Management for chronic issues. 2 week follow up blood pressure     HPI: Patient is a 49 y.o. female seen in the office today for follow up on blood pressure.  Using boyfriends electric blood pressure cuff. Started hydralazine 25 mg three times daily. Blood pressure ranging 120s/80s. Much better. Did not bring blood pressure readings to office today. Tolerating medication without side effects.  Pt has been taking lisinopril 10 mg daily Lisinopril 40 mg daily and 10 mg daily both on medication list.  Review of Systems:  Review of Systems  Constitutional: Negative for activity change, appetite change, fatigue and unexpected weight change.  Respiratory: Negative for cough and shortness of breath.        Hx of sleep apnea has CPAP  Cardiovascular: Negative for chest pain, palpitations and leg swelling.  Gastrointestinal: Negative for nausea, abdominal pain, diarrhea, constipation and abdominal distention.  Genitourinary: Negative for dysuria, urgency, frequency and flank pain.  Musculoskeletal: Positive for myalgias and back pain.       Chronic pain- well controlled at this time  Skin: Negative for rash.  Neurological: Negative for dizziness, seizures (no seizures since 2014), weakness and headaches.  Psychiatric/Behavioral: Negative for suicidal ideas and hallucinations.    Past Medical History  Diagnosis Date  . Hypertension   . Asthma   . Seizures ([place])   . Heart murmur     born with   . Dysrhythmia     skipped beats  . Myocardial infarction ([place])     hi of x 2  last one 2003   . COPD (chronic  obstructive pulmonary disease) (HCC)     chronic bronchitis   . Pneumonia     hx of 2009  . GERD (gastroesophageal reflux disease)   . Headache(784.0)     migraines   . Neuropathy ([place])     due to diabetes   . Dizziness     secondary to diabetes and hypertension   . Arthritis     joint pain   . Hx of blood clots     hematomas removed from left side of brain from 65mo to 89yrold   . Epilepsy idiopathic petit mal ([facility]    last seizure 2012;controlled w/ topomax  . Diabetic neuropathy, painful ([facility]    FEET AND HANDS  . Diabetes mellitus     since age [age]type 2 IDDM  . Sleep apnea     [facility]does [facility] ; mild  . MIGRAINE HEADACHE 09/14/2006    Qualifier: Diagnosis of  By: MaHassell DoneNP, NyTori Milks  . Glaucoma     NOT ON ANY EYE DROPS   . Legally blind     left eye   . [facility]    last 2003  RESIDUAL LEFT LEG WEAKNESS--NO OTHER RESIDUAL PROBLEMS  . CVA 11/14/2007    Qualifier: Diagnosis of  By: MaHassell DoneNP, NyTori Milks  . Pain 09/23/11    LOWER BACK AND RIGHT SIDE--PT HAS RIGHT URETERAL STONE  .  Nephrolithiasis     frequent urination , urination at nite  PT SEEN IN ER 09/22/11 FOR BACK AND RT SIDED PAIN--HAS KNOWN STONE RT URETER AND UA IN ER SHOWED UTI  . Anxiety   . Depression   . Stress incontinence   . Hyperlipidemia   . Pyelonephritis    Past Surgical History  Procedure Laterality Date  . Kidney stone removal    . Brain hematoma evacuation      five procedures total, first procedure when 34 months old, last at 49 years of age.  . Shunt removal      shunt inserted at age 72 removed at age 62   . Eye surgery    . Retinal detachment surgery  1990  . Vaginal hysterectomy  1996  . Incisional hernia repair  05/05/2011    Procedure: LAPAROSCOPIC INCISIONAL HERNIA;  Surgeon: Rolm Bookbinder, MD;  Location: [place];  Service: General;  Laterality: N/A;  . Mass excision  05/05/2011    Procedure: EXCISION MASS;  Surgeon: Rolm Bookbinder, MD;   Location: [place];  Service: General;  Laterality: Right;  . Hernia repair  05/05/11    lap incisional hernia  . Pcnl    . Cystoscopy/retrograde/ureteroscopy  08/24/2011    Procedure: CYSTOSCOPY/RETROGRADE/URETEROSCOPY;  Surgeon: Molli Hazard, MD;  [facility];  Service: Urology;  Laterality: Right;  Cysto, Right retrograde Pyelogram, right stent placement.   . Cystoscopy with ureteroscopy  09/24/2011    Procedure: CYSTOSCOPY WITH URETEROSCOPY;  Surgeon: Malka So, MD;  [facility];  Service: Urology;  Laterality: Right;  Balloon dilation right ureter   . Cystoscopy w/ retrogrades  09/24/2011    Procedure: CYSTOSCOPY WITH RETROGRADE PYELOGRAM;  Surgeon: Malka So, MD;  [facility];  Service: Urology;  Laterality: Right;   Social History:   reports that she quit smoking about 27 years ago. Her smoking use included Cigarettes. She has a 30 pack-year smoking history. She has never used smokeless tobacco. She reports that she drinks alcohol. She reports that she does not use illicit drugs.  Family History  Problem Relation Age of Onset  . Cancer Mother     uterine  . Hypertension Mother   . Cancer Maternal Grandmother     brain  . Hypertension Maternal Grandmother   . Anesthesia problems Neg Hx     Medications: Patient's Medications  New Prescriptions   LISINOPRIL (PRINIVIL,ZESTRIL) 10 MG TABLET    Take 1 tablet (10 mg total) by mouth daily.  Previous Medications   BACLOFEN (LIORESAL) 10 MG TABLET    TAKE 1 TABLET BY MOUTH THREE TIMES A DAY AS NEEDED FOR MUSCLE SPASMS   CLOPIDOGREL (PLAVIX) 75 MG TABLET    TAKE 1 TABLET BY MOUTH EVERY DAY   FLUTICASONE (FLONASE) 50 MCG/ACT NASAL SPRAY    Place 2 sprays into both nostrils daily.   GABAPENTIN (NEURONTIN) 300 MG CAPSULE    TAKE 2 CAPSULES BY MOUTH THREE TIMES A DAY   GLUCOSE BLOOD (ONETOUCH VERIO) TEST STRIP    1 each by Other route 3 (three) times daily. E11.29   IBUPROFEN (ADVIL,MOTRIN) 600 MG TABLET    TAKE 1  TABLET BY MOUTH EVERY 8 HOURS AS NEEDED FOR PAIN   INSULIN GLARGINE (TOUJEO SOLOSTAR) 300 UNIT/ML SOPN    Inject 96 Units into the skin daily.   INSULIN LISPRO, HUMAN, (HUMALOG KWIKPEN) 200 UNIT/ML SOPN    Inject 5 Units into the skin 3 (three) times daily with meals.  MYRBETRIQ 25 MG TB24 TABLET    TAKE 1 TABLET BY MOUTH ONCE DAILY FOR BLADDER   ONDANSETRON (ZOFRAN ODT) 4 MG DISINTEGRATING TABLET    Take 1 tablet (4 mg total) by mouth every 8 (eight) hours as needed for nausea or vomiting.   PANTOPRAZOLE (PROTONIX) 40 MG TABLET    TAKE 1 TABLET BY MOUTH DAILY   POLYETHYLENE GLYCOL (MIRALAX) PACKET    Take 17 g by mouth daily.   POTASSIUM CHLORIDE SA (K-DUR,KLOR-CON) 20 MEQ TABLET    Take 2 tablets (40 mEq total) by mouth daily.   PROAIR HFA 108 (90 BASE) MCG/ACT INHALER    INHALE 2 PUFFS BY MOUTH EVERY 6 HOURS AS NEEDED FOR WHEEZING OR SHORTNESS OF BREATH   RANITIDINE (ZANTAC) 150 MG TABLET    TAKE 1 TABLET BY MOUTH TWICE DAILY   SIMVASTATIN (ZOCOR) 20 MG TABLET    TAKE 1 TABLET BY MOUTH EVERY MORNING FOR CHOLESTROL   SYMBICORT 160-4.5 MCG/ACT INHALER    INHALE 2 PUFFS BY MOUTH TWICE DAILY   TOPIRAMATE (TOPAMAX) 200 MG TABLET    TAKE 1 TABLET BY MOUTH TWICE DAILY   VERAPAMIL (VERELAN PM) 240 MG 24 HR CAPSULE    TAKE 1 CAPSULE BY MOUTH EVERY NIGHT AT BEDTIME FOR BLOOD PRESSURE  Modified Medications   Modified Medication Previous Medication   HYDRALAZINE (APRESOLINE) 25 MG TABLET hydrALAZINE (APRESOLINE) 25 MG tablet      Take 1 tablet (25 mg total) by mouth 3 (three) times daily.    Take 1 tablet (25 mg total) by mouth 3 (three) times daily.  Discontinued Medications   FLUOXETINE (PROZAC) 20 MG CAPSULE    Take 3 capsules (60 mg total) by mouth daily at 12 noon.   LISINOPRIL (PRINIVIL,ZESTRIL) 10 MG TABLET    TAKE 1 TABLET BY MOUTH EVERY DAY   LISINOPRIL (PRINIVIL,ZESTRIL) 40 MG TABLET    Take 1 tablet (40 mg total) by mouth daily.     Physical Exam:  Filed Vitals:   01/08/15 1237  BP:  132/86  Pulse: 82  Temp: 98.2 F (36.8 C)  TempSrc: Oral  Height: 5' 6"  (1.676 m)  Weight: 225 lb 9.6 oz (102.331 kg)   Body mass index is 36.43 kg/(m^2).  Physical Exam  Constitutional: She is oriented to person, place, and time. She appears well-developed and well-nourished. No distress.  HENT:  Head: Normocephalic and atraumatic.  Neck: Normal range of motion. Neck supple.  Cardiovascular: Normal rate, regular rhythm and normal heart sounds.   Pulmonary/Chest: Effort normal and breath sounds normal.  Abdominal: Soft. Bowel sounds are normal. She exhibits no distension. There is no tenderness.  Musculoskeletal: She exhibits no edema.  Neurological: She is alert and oriented to person, place, and time.  Skin: Skin is warm and dry. She is not diaphoretic.  Psychiatric: She has a normal mood and affect.    Labs reviewed: Basic Metabolic Panel:  Recent Labs  06/21/14 1535 09/04/14 1030 11/27/14 1219 12/19/14 0915  NA 142 142 141 139  K 4.0 3.9 4.0 4.0  CL 105 104 106 104  CO2 23 22  --  23  GLUCOSE 193* 120* 96 125*  BUN 16 13 20 17   CREATININE 1.09* 1.07* 1.10* 0.86  CALCIUM 9.1 9.1  --  9.1   Liver Function Tests:  Recent Labs  02/23/14 0530 06/18/14 0001 09/04/14 1030  AST 14 11* 9  ALT 13 12* 7  ALKPHOS 69 74 66  BILITOT 0.4 <0.1* <0.2  PROT 6.4 7.3 7.1  ALBUMIN 3.0* 3.5 3.9    Recent Labs  06/18/14 0001  LIPASE 22   No results for input(s): AMMONIA in the last 8760 hours. CBC:  Recent Labs  03/15/14 1524 06/18/14 0001 09/04/14 1030 11/27/14 1210 11/27/14 1219  WBC 4.8 5.7 5.6 5.8  --   NEUTROABS 2.4 5.2 3.8 3.6  --   HGB 13.0 11.3*  --  11.5* 12.6  HCT 39.7 36.1 36.5 36.2 37.0  MCV 91 91.9  --  92.1  --   PLT 285 202  --  254  --    Lipid Panel: No results for input(s): CHOL, HDL, LDLCALC, TRIG, CHOLHDL, LDLDIRECT in the last 8760 hours. TSH: No results for input(s): TSH in the last 8760 hours. A1C: Lab Results  Component Value  Date   HGBA1C 7.2* 12/19/2014     Assessment/Plan 1. Essential hypertension, benign -improved with apresoline start. To cont lisinopril 10 mg daily, verapamil and hydralazine.  -cont dietary modifications.  - hydrALAZINE (APRESOLINE) 25 MG tablet; Take 1 tablet (25 mg total) by mouth 3 (three) times daily.  Dispense: 90 tablet; Refill: 0 To keep follow up appt, sooner if needed  Macarius Ruark K. Harle Battiest  [facility] [phone] 8 am - 5 pm) [phone] (after hours)

## 2015-01-09 ENCOUNTER — Other Ambulatory Visit: Payer: Self-pay

## 2015-01-09 DIAGNOSIS — E876 Hypokalemia: Secondary | ICD-10-CM

## 2015-01-09 MED ORDER — POTASSIUM CHLORIDE CRYS ER 20 MEQ PO TBCR: 40.0000 meq | EXTENDED_RELEASE_TABLET | Freq: Every day | ORAL | Status: DC

## 2015-01-09 NOTE — Telephone Encounter (Signed)
Manual fax received from the pharmacy stating: RX was sent to the pharmacy on 12/25/14 for # 6 with 3 refills, RX should be for # 60. RX was corrected and sent electronically.

## 2015-01-23 ENCOUNTER — Ambulatory Visit: Payer: Self-pay | Admitting: Gastroenterology

## 2015-02-01 ENCOUNTER — Other Ambulatory Visit: Payer: Self-pay | Admitting: Nurse Practitioner

## 2015-02-25 ENCOUNTER — Telehealth: Payer: Self-pay

## 2015-02-25 NOTE — Telephone Encounter (Signed)
Patient called over the weekend for an ABX for cold related symptoms. Patient was told to call today for an appointment. Patient did not call. Per Dr.Reed call and follow-up with patient to see if symptoms resolved.

## 2015-03-07 ENCOUNTER — Other Ambulatory Visit: Payer: Self-pay | Admitting: Nurse Practitioner

## 2015-03-14 ENCOUNTER — Telehealth: Payer: Self-pay

## 2015-03-14 NOTE — Telephone Encounter (Signed)
Patient is currently at her dentist office and they are calling to get clearance for her dental cleaning, patient is currently on Plavix.  Please advise   Per Sherrie Mustache, NP ok for patient to have dental cleaning, phone note will be faxed to [phone]

## 2015-03-18 ENCOUNTER — Encounter: Payer: Self-pay | Admitting: Nurse Practitioner

## 2015-03-21 ENCOUNTER — Ambulatory Visit: Payer: Medicare Other | Admitting: Nurse Practitioner

## 2015-03-27 ENCOUNTER — Telehealth: Payer: Self-pay | Admitting: Nurse Practitioner

## 2015-03-27 NOTE — Telephone Encounter (Signed)
Patient never returned called

## 2015-03-27 NOTE — Telephone Encounter (Signed)
FYI  We have made several attempts as well as [facility] to schedule patient for her ordered mammogram - including sending the patient a letter and she has failed to respond to our attempts to schedule.      CC'd Rodena Piety for data entry

## 2015-04-06 ENCOUNTER — Other Ambulatory Visit: Payer: Self-pay | Admitting: Nurse Practitioner

## 2015-04-08 ENCOUNTER — Other Ambulatory Visit: Payer: Self-pay | Admitting: Nurse Practitioner

## 2015-04-17 ENCOUNTER — Ambulatory Visit: Payer: Self-pay | Admitting: Pulmonary Disease

## 2015-04-29 ENCOUNTER — Ambulatory Visit (INDEPENDENT_AMBULATORY_CARE_PROVIDER_SITE_OTHER): Payer: Medicare Other | Admitting: Internal Medicine

## 2015-04-29 ENCOUNTER — Encounter: Payer: Self-pay | Admitting: Internal Medicine

## 2015-04-29 VITALS — BP 132/82 | HR 70 | Temp 98.0°F | Ht 66.0 in | Wt 228.0 lb

## 2015-04-29 DIAGNOSIS — E785 Hyperlipidemia, unspecified: Secondary | ICD-10-CM

## 2015-04-29 DIAGNOSIS — E1165 Type 2 diabetes mellitus with hyperglycemia: Secondary | ICD-10-CM | POA: Diagnosis not present

## 2015-04-29 DIAGNOSIS — Z1211 Encounter for screening for malignant neoplasm of colon: Secondary | ICD-10-CM

## 2015-04-29 DIAGNOSIS — E1140 Type 2 diabetes mellitus with diabetic neuropathy, unspecified: Secondary | ICD-10-CM

## 2015-04-29 DIAGNOSIS — IMO0002 Reserved for concepts with insufficient information to code with codable children: Secondary | ICD-10-CM

## 2015-04-29 DIAGNOSIS — I10 Essential (primary) hypertension: Secondary | ICD-10-CM

## 2015-04-29 DIAGNOSIS — Z23 Encounter for immunization: Secondary | ICD-10-CM | POA: Diagnosis not present

## 2015-04-29 DIAGNOSIS — N3281 Overactive bladder: Secondary | ICD-10-CM

## 2015-04-29 MED ORDER — MIRABEGRON ER 50 MG PO TB24: 50.0000 mg | ORAL_TABLET | Freq: Every day | ORAL | Status: DC

## 2015-04-29 NOTE — Patient Instructions (Addendum)
Please come in fasting for labs (make appointment at [facility]).  Continue to work on exercise and low carb, low fat, low sodium diet.  You should hear from [name] about a GI appt for your colonoscopy  Bring [name] your paperwork for silver sneakers.

## 2015-04-29 NOTE — Progress Notes (Signed)
Patient ID: [name], female   DOB: [birth year], 50 y.o.   MRN: [id number]   Location:  [facility] clinic Provider:  Jaylend Reiland L. Mariea Clonts, D.O., C.M.D.  Code Status: full code Goals of Care:  Advanced Directives 04/29/2015  Does patient have an advance directive? Yes  Copy of advanced directive(s) in chart? No - copy requested  Would patient like information on creating an advanced directive? -     Chief Complaint  Patient presents with  . Medical Management of Chronic Issues    4 mth follow-up    HPI: Patient is a 50 y.o. female seen today for medical management of chronic diseases.    Says she is doing well.  Trying to lose weight. Weight is about the same as last visit.  Is exercising.  Still on diabetic diet.  Trying to lose her stomach.  Asks about [name] filling out a form for her to do silver sneakers program.  Advised we would.  BP elevated today.  Took her bp meds.  Avoids salty foods and adding salt.  Recheck was 132/82 at end of visit.  Just had eye appt Saturday.  Saw Dr. Marin Comment.  Changed prescription for her glasses.  Needs her glaucoma check done.  Given eye drops for twice a day.  No retinopathy.    Says GI was going to call her to schedule cscope, but there were concerns b/c of her being on plavix for her prior stroke and MI.  Reordered today.  She is still on plavix though, but would need to be held for 3 days before procedure.   Past Medical History  Diagnosis Date  . Hypertension   . Asthma   . Seizures ([place])   . Heart murmur     born with   . Dysrhythmia     skipped beats  . Myocardial infarction ([place])     hi of x 2  last one 2003   . COPD (chronic obstructive pulmonary disease) (HCC)     chronic bronchitis   . Pneumonia     hx of 2009  . GERD (gastroesophageal reflux disease)   . Headache(784.0)     migraines   . Neuropathy ([place])     due to diabetes   . Dizziness     secondary to diabetes and hypertension   . Arthritis     joint pain   . Hx of blood clots      hematomas removed from left side of brain from 29mo to 50yrold   . Epilepsy idiopathic petit mal ([facility]    last seizure 2012;controlled w/ topomax  . Diabetic neuropathy, painful ([place]    FEET AND HANDS  . Diabetes mellitus     since age [age]type 2 IDDM  . Sleep ap[facility]     [facility] not [facility] ; mild  . MIGRAINE HEADACHE 09/14/2006    Qualifier: Diagnosis of  By: MaHassell DoneNP, NyTori Milks  . Glaucoma     NOT ON ANY EYE DROPS   . Legally blind     left eye   . [facility]    last 2003  RESIDUAL LEFT LEG WEAKNESS--NO OTHER RESIDUAL PROBLEMS  . CVA 11/14/2007    Qualifier: Diagnosis of  By: MaHassell DoneNP, NyTori Milks  . Pain 09/23/11    LOWER BACK AND RIGHT SIDE--PT HAS RIGHT URETERAL STONE  . Nephrolithiasis     frequent urination , urination at [employer]  PT SEEN IN ER 09/22/11 FOR BACK AND RT SIDED PAIN--HAS KNOWN STONE RT URETER AND UA IN ER SHOWED UTI  . Anxiety   . Depression   . Stress incontinence   . Hyperlipidemia   . Pyelonephritis     Past Surgical History  Procedure Laterality Date  . Kidney stone removal    . Brain hematoma evacuation      five procedures total, first procedure when 41 months old, last at 50 years of age.  . Shunt removal      shunt inserted at age 46 removed at age 60   . Eye surgery    . Retinal detachment surgery  1990  . Vaginal hysterectomy  1996  . Incisional hernia repair  05/05/2011    Procedure: LAPAROSCOPIC INCISIONAL HERNIA;  Surgeon: Rolm Bookbinder, MD;  Location: [facility];  Service: General;  Laterality: N/A;  . Mass excision  05/05/2011    Procedure: EXCISION MASS;  Surgeon: Rolm Bookbinder, MD;  Location: [facility];  Service: General;  Laterality: Right;  . Hernia repair  05/05/11    lap incisional hernia  . Pcnl    . Cystoscopy/retrograde/ureteroscopy  08/24/2011    Procedure: CYSTOSCOPY/RETROGRADE/URETEROSCOPY;  Surgeon: Molli Hazard, MD;  Location: [facility];  Service: Urology;  Laterality: Right;  Cysto,  Right retrograde Pyelogram, right stent placement.   . Cystoscopy with ureteroscopy  09/24/2011    Procedure: CYSTOSCOPY WITH URETEROSCOPY;  Surgeon: Malka So, MD;  Location: [facility];  Service: Urology;  Laterality: Right;  Balloon dilation right ureter   . Cystoscopy w/ retrogrades  09/24/2011    Procedure: CYSTOSCOPY WITH RETROGRADE PYELOGRAM;  Surgeon: Malka So, MD;  Location[facility];  Service: Urology;  Laterality: Right;    Allergies  Allergen Reactions  . Codeine Swelling and Other (See Comments)    Swelling and burning of mouth (inside)  . Lactose Intolerance ([place]) Nausea And Vomiting      Medication List       This list is accurate as of: 04/29/15  3:46 PM.  Always use your most recent med list.               baclofen 10 MG tablet  Commonly known as:  LIORESAL  TAKE 1 TABLET BY MOUTH 3 TIMES A DAY AS NEEDED FOR MUSCLE SPASMS     budesonide-formoterol 160-4.5 MCG/ACT inhaler  Commonly known as:  SYMBICORT  Inhale 2 puffs into the lungs daily.     clopidogrel 75 MG tablet  Commonly known as:  PLAVIX  TAKE 1 TABLET BY MOUTH EVERY DAY     fluticasone 50 MCG/ACT nasal spray  Commonly known as:  FLONASE  Place 2 sprays into both nostrils daily.     gabapentin 300 MG capsule  Commonly known as:  NEURONTIN  TAKE 2 CAPSULES BY MOUTH 3 TIMES A DAY     hydrALAZINE 25 MG tablet  Commonly known as:  APRESOLINE  TAKE 1 TABLET BY MOUTH 3 TIMES A DAY     ibuprofen 600 MG tablet  Commonly known as:  ADVIL,MOTRIN  TAKE 1 TABLET BY MOUTH EVERY 8 HOURS AS NEEDED FOR PAIN     Insulin Lispro 200 UNIT/ML Sopn  Commonly known as:  HUMALOG KWIKPEN  Inject 5 Units into the skin 3 (three) times daily with meals.     lisinopril 10 MG tablet  Commonly known as:  PRINIVIL,ZESTRIL  Take 1 tablet (10 mg total) by mouth daily.     MYRBETRIQ  25 MG Tb24 tablet  Generic drug:  mirabegron ER  TAKE 1 TABLET BY MOUTH ONCE DAILY FOR BLADDER     ondansetron 4 MG disintegrating tablet   Commonly known as:  ZOFRAN-ODT  TAKE 1 TABLET BY MOUTH EVERY 8 HOURS AS NEEDED FOR NAUSEA OR VOMITING     ONETOUCH VERIO test strip  Generic drug:  glucose blood  1 each by Other route 3 (three) times daily. E11.29     pantoprazole 40 MG tablet  Commonly known as:  PROTONIX  TAKE 1 TABLET BY MOUTH EVERY DAY     polyethylene glycol packet  Commonly known as:  MIRALAX / GLYCOLAX  Take 17 g by mouth daily as needed for mild constipation.     potassium chloride SA 20 MEQ tablet  Commonly known as:  K-DUR,KLOR-CON  Take 2 tablets (40 mEq total) by mouth daily.     PROAIR HFA 108 (90 Base) MCG/ACT inhaler  Generic drug:  albuterol  INHALE 2 PUFFS BY MOUTH EVERY 6 HOURS AS NEEDED FOR WHEEZING OR SHORTNESS OF BREATH     ranitidine 150 MG tablet  Commonly known as:  ZANTAC  TAKE 1 TABLET BY MOUTH TWICE A DAY     simvastatin 20 MG tablet  Commonly known as:  ZOCOR  TAKE 1 TABLET BY MOUTH EVERY MORNING FOR CHOLESTEROL     topiramate 200 MG tablet  Commonly known as:  TOPAMAX  TAKE 1 TABLET BY MOUTH TWICE A DAY     TOUJEO SOLOSTAR 300 UNIT/ML Sopn  Generic drug:  Insulin Glargine  Inject 96 Units into the skin daily.     verapamil 240 MG 24 hr capsule  Commonly known as:  VERELAN PM  TAKE ONE CAPSULE BY MOUTH EVERY NIGHT AT BEDTIME FOR BLOOD PRESSURE        Review of Systems:  Review of Systems  Constitutional: Negative for fever, chills and malaise/fatigue.  HENT: Negative for hearing loss.   Eyes: Negative for blurred vision.  Respiratory: Negative for shortness of breath.   Cardiovascular: Negative for chest pain and leg swelling.  Gastrointestinal: Negative for abdominal pain, constipation, blood in stool and melena.  Genitourinary: Negative for dysuria and urgency.  Musculoskeletal: Negative for falls.  Neurological: Negative for dizziness, loss of consciousness and weakness.  Endo/Heme/Allergies:       Diabetes  Psychiatric/Behavioral: Negative for depression  and memory loss.    Health Maintenance  Topic Date Due  . OPHTHALMOLOGY EXAM  09/22/1974  . TETANUS/TDAP  09/22/1983  . PNEUMOCOCCAL POLYSACCHARIDE VACCINE (2) 11/20/2011  . COLONOSCOPY  09/22/2014  . FOOT EXAM  02/06/2015  . HEMOGLOBIN A1C  06/18/2015  . MAMMOGRAM  06/23/2015  . INFLUENZA VACCINE  08/20/2015  . PAP SMEAR  03/06/2018  . HIV Screening  Completed    Physical Exam: Filed Vitals:   04/29/15 1513  BP: 150/90  Pulse: 70  Temp: 98 F (36.7 C)  TempSrc: Oral  Height: 5' 6"  (1.676 m)  Weight: 228 lb (103.42 kg)  SpO2: 98%   Body mass index is 36.82 kg/(m^2). Physical Exam  Constitutional: She is oriented to person, place, and time. She appears well-developed and well-nourished. No distress.  Obese black female  HENT:  Head: Normocephalic and atraumatic.  Eyes:  Proptosis of eyes  Cardiovascular: Normal rate, regular rhythm, normal heart sounds and intact distal pulses.   Pulmonary/Chest: Effort normal and breath sounds normal. No respiratory distress.  Abdominal: Soft. Bowel sounds are normal.  Musculoskeletal: Normal range of  motion.  Neurological: She is alert and oriented to person, place, and time.  Skin: Skin is warm and dry.    Labs reviewed: Basic Metabolic Panel:  Recent Labs  06/21/14 1535 09/04/14 1030 11/27/14 1219 12/19/14 0915  NA 142 142 141 139  K 4.0 3.9 4.0 4.0  CL 105 104 106 104  CO2 23 22  --  23  GLUCOSE 193* 120* 96 125*  BUN 16 13 20 17   CREATININE 1.09* 1.07* 1.10* 0.86  CALCIUM 9.1 9.1  --  9.1   Liver Function Tests:  Recent Labs  06/18/14 0001 09/04/14 1030  AST 11* 9  ALT 12* 7  ALKPHOS 74 66  BILITOT <0.1* <0.2  PROT 7.3 7.1  ALBUMIN 3.5 3.9    Recent Labs  06/18/14 0001  LIPASE 22   No results for input(s): AMMONIA in the last 8760 hours. CBC:  Recent Labs  06/18/14 0001 09/04/14 1030 11/27/14 1210 11/27/14 1219  WBC 5.7 5.6 5.8  --   NEUTROABS 5.2 3.8 3.6  --   HGB 11.3*  --  11.5* 12.6   HCT 36.1 36.5 36.2 37.0  MCV 91.9 88 92.1  --   PLT 202 276 254  --    Lipid Panel: No results for input(s): CHOL, HDL, LDLCALC, TRIG, CHOLHDL, LDLDIRECT in the last 8760 hours. Lab Results  Component Value Date   HGBA1C 7.2* 12/19/2014    Assessment/Plan 1. Screening for colon cancer - requests referral be placed again for [facility] due to previous issue related to her plavix (?) - Ambulatory referral to Gastroenterology - CBC with Differential/Platelet; Future  2. DM type 2, uncontrolled, with neuropathy ([place]) - reassess control--last was last year -had retina exam which was negative  -needs foot exam next time -is on plavix instead of asa -needs prevnar (today) - CBC with Differential/Platelet; Future - Comprehensive metabolic panel; Future - Hemoglobin A1c; Future - Lipid panel; Future - TSH; Future  3. Essential hypertension, benign - bp elevated at arrival but improved with rest to 132/82 - CBC with Differential/Platelet; Future - Comprehensive metabolic panel; Future  4. Obesity, Class III, BMI 40-49.9 (morbid obesity) (HCC) - weight no better - says she is dieting and exercising, but then requested paperwork be completed for her to go to silversneakers (she is to bring this) - TSH; Future  5. Hyperlipidemia LDL goal <70 - cont statin therapy and reassess lipids at least annually - Lipid panel; Future  6. Overactive bladder -says it's better with the 5m mybetriq, but still not great--still having some frequency and urgency so we decided to increase the dose - mirabegron ER (MYRBETRIQ) 50 MG TB24 tablet; take 1 tablet (50 mg total) by mouth daily.  Dispense: 30 tablet; Refill: 3  7. Need for vaccination with 13-polyvalent pneumococcal conjugate vaccine - Pneumococcal conjugate vaccine 13-valent (prevnar) given  Labs/tests ordered:  Orders Placed This Encounter  Procedures  . Pneumococcal conjugate vaccine 13-valent  . CBC with Differential/Platelet     Standing Status: Future     Number of Occurrences:      Standing Expiration Date: 07/29/2015  . Comprehensive metabolic panel    Standing Status: Future     Number of Occurrences:      Standing Expiration Date: 07/29/2015    Order Specific Question:  Has the patient fasted?    Answer:  Yes  . Hemoglobin A1c    Standing Status: Future     Number of Occurrences:  Standing Expiration Date: 07/29/2015  . Lipid panel    Standing Status: Future     Number of Occurrences:      Standing Expiration Date: 07/29/2015    Order Specific Question:  Has the patient fasted?    Answer:  Yes  . TSH    Standing Status: Future     Number of Occurrences:      Standing Expiration Date: 07/29/2015  . Ambulatory referral to Gastroenterology    Referral Priority:  Routine    Referral Type:  Consultation    Referral Reason:  Specialty Services Required    Number of Visits Requested:  1    Next appt:  05/08/2015 for labs and 7/11 with Janett Billow for foot exam and med mgt (will need labs again)   Enma Maeda L. Yaretzi Ernandez, D.O. [facility] [address], [place] [address] Cell Phone (Mon-Fri 8am-5pm):  [phone] On Call:  [phone] & follow prompts after 5pm & weekends Office Phone:  [phone] Office Fax:  [phone]

## 2015-04-30 ENCOUNTER — Encounter: Payer: Self-pay | Admitting: Gastroenterology

## 2015-05-08 ENCOUNTER — Other Ambulatory Visit: Payer: Medicare Other

## 2015-05-08 DIAGNOSIS — Z1211 Encounter for screening for malignant neoplasm of colon: Secondary | ICD-10-CM

## 2015-05-08 DIAGNOSIS — IMO0002 Reserved for concepts with insufficient information to code with codable children: Secondary | ICD-10-CM

## 2015-05-08 DIAGNOSIS — E1140 Type 2 diabetes mellitus with diabetic neuropathy, unspecified: Secondary | ICD-10-CM

## 2015-05-08 DIAGNOSIS — E1165 Type 2 diabetes mellitus with hyperglycemia: Secondary | ICD-10-CM

## 2015-05-08 DIAGNOSIS — I10 Essential (primary) hypertension: Secondary | ICD-10-CM

## 2015-05-08 DIAGNOSIS — E785 Hyperlipidemia, unspecified: Secondary | ICD-10-CM

## 2015-05-08 DIAGNOSIS — E66813 Obesity, class 3: Secondary | ICD-10-CM

## 2015-05-09 ENCOUNTER — Telehealth: Payer: Self-pay | Admitting: Nurse Practitioner

## 2015-05-09 LAB — CBC WITH DIFFERENTIAL/PLATELET
Basophils Absolute: 0 10*3/uL (ref 0.0–0.2)
Basos: 0 %
EOS (ABSOLUTE): 0 10*3/uL (ref 0.0–0.4)
Eos: 0 %
Hematocrit: 39.5 % (ref 34.0–46.6)
Hemoglobin: 13.1 g/dL (ref 11.1–15.9)
Immature Grans (Abs): 0 10*3/uL (ref 0.0–0.1)
Immature Granulocytes: 0 %
Lymphocytes Absolute: 1.7 10*3/uL (ref 0.7–3.1)
Lymphs: 22 %
MCH: 30.2 pg (ref 26.6–33.0)
MCHC: 33.2 g/dL (ref 31.5–35.7)
MCV: 91 fL (ref 79–97)
Monocytes Absolute: 0.5 10*3/uL (ref 0.1–0.9)
Monocytes: 7 %
Neutrophils Absolute: 5.5 10*3/uL (ref 1.4–7.0)
Neutrophils: 71 %
Platelets: 274 10*3/uL (ref 150–379)
RBC: 4.34 x10E6/uL (ref 3.77–5.28)
RDW: 14.1 % (ref 12.3–15.4)
WBC: 7.7 10*3/uL (ref 3.4–10.8)

## 2015-05-09 LAB — COMPREHENSIVE METABOLIC PANEL
ALT: 12 IU/L (ref 0–32)
AST: 9 IU/L (ref 0–40)
Albumin/Globulin Ratio: 1.1 — ABNORMAL LOW (ref 1.2–2.2)
Albumin: 4.1 g/dL (ref 3.5–5.5)
Alkaline Phosphatase: 81 IU/L (ref 39–117)
BUN/Creatinine Ratio: 13 (ref 9–23)
BUN: 14 mg/dL (ref 6–24)
Bilirubin Total: 0.3 mg/dL (ref 0.0–1.2)
CO2: 19 mmol/L (ref 18–29)
Calcium: 9.8 mg/dL (ref 8.7–10.2)
Chloride: 102 mmol/L (ref 96–106)
Creatinine, Ser: 1.1 mg/dL — ABNORMAL HIGH (ref 0.57–1.00)
GFR calc Af Amer: 68 mL/min/{1.73_m2} (ref >59)
GFR calc non Af Amer: 59 mL/min/{1.73_m2} — ABNORMAL LOW (ref >59)
Globulin, Total: 3.6 g/dL (ref 1.5–4.5)
Glucose: 218 mg/dL — ABNORMAL HIGH (ref 65–99)
Potassium: 4.2 mmol/L (ref 3.5–5.2)
Sodium: 138 mmol/L (ref 134–144)
Total Protein: 7.7 g/dL (ref 6.0–8.5)

## 2015-05-09 LAB — HEMOGLOBIN A1C
Est. average glucose Bld gHb Est-mCnc: 226 mg/dL
Hgb A1c MFr Bld: 9.5 % — ABNORMAL HIGH (ref 4.8–5.6)

## 2015-05-09 LAB — LIPID PANEL
Chol/HDL Ratio: 3.5 ratio units (ref 0.0–4.4)
Cholesterol, Total: 167 mg/dL (ref 100–199)
HDL: 48 mg/dL (ref >39)
LDL Calculated: 99 mg/dL (ref 0–99)
Triglycerides: 98 mg/dL (ref 0–149)
VLDL Cholesterol Cal: 20 mg/dL (ref 5–40)

## 2015-05-09 LAB — TSH: TSH: 0.711 u[IU]/mL (ref 0.450–4.500)

## 2015-05-09 NOTE — Telephone Encounter (Signed)
Noted. Please include snap shot with form. thanks

## 2015-05-09 NOTE — Telephone Encounter (Signed)
[name] filled out a walk in form and ask for a handicapp form to be filled out for her. The form was placed in Dr. [place] tray for review.

## 2015-05-13 ENCOUNTER — Encounter: Payer: Self-pay | Admitting: Nurse Practitioner

## 2015-05-13 ENCOUNTER — Telehealth: Payer: Self-pay

## 2015-05-13 NOTE — Telephone Encounter (Signed)
Disability parking placard was requested via triage walk-in form, form was filled out and given to [place] on Friday, Dr.Carter requested that form completion be completed by Dr.Reed for she last seen patient.  Per Dr.Reed call the patient and see why she has requested this form.  Left message on voicemail for patient to return call when available  @ [phone] (number listed on triage walk-in form). I dicussed this with Rodena Piety (triage assistant) in case patient returns call tomorrow when I am out of office.

## 2015-05-15 NOTE — Telephone Encounter (Signed)
Left message on voicemail for patient to return call when available   

## 2015-05-16 NOTE — Telephone Encounter (Signed)
Please call patient to see if able to follow-up on request for Handicap Sticker

## 2015-05-16 NOTE — Telephone Encounter (Signed)
LMOM to return call.

## 2015-05-20 ENCOUNTER — Encounter: Payer: Self-pay | Admitting: Pharmacotherapy

## 2015-05-20 ENCOUNTER — Ambulatory Visit (INDEPENDENT_AMBULATORY_CARE_PROVIDER_SITE_OTHER): Payer: Medicare Other | Admitting: Pharmacotherapy

## 2015-05-20 VITALS — BP 140/90 | HR 86 | Temp 98.2°F | Resp 18 | Ht 66.0 in | Wt 232.6 lb

## 2015-05-20 DIAGNOSIS — E1165 Type 2 diabetes mellitus with hyperglycemia: Secondary | ICD-10-CM

## 2015-05-20 DIAGNOSIS — E1140 Type 2 diabetes mellitus with diabetic neuropathy, unspecified: Secondary | ICD-10-CM

## 2015-05-20 DIAGNOSIS — IMO0002 Reserved for concepts with insufficient information to code with codable children: Secondary | ICD-10-CM

## 2015-05-20 DIAGNOSIS — I10 Essential (primary) hypertension: Secondary | ICD-10-CM | POA: Diagnosis not present

## 2015-05-20 NOTE — Patient Instructions (Signed)
Come back in 2 weeks for CGM assessment. If sensor falls off early, put in a ziplock bag and bring to next office visit.

## 2015-05-20 NOTE — Progress Notes (Signed)
  Subjective:    [name] is a 50 y.o.African American female who presents for follow-up of Type 2 diabetes mellitus.   A1C:  9.5% Last A1C was 7.2%  Her meter is new.  Average BG:  189m/dl She is taking Toujeo 96 units daily She is taking Humalog 5 units with each meal. Denies missing any doses.  Now living in her own apartment with her boyfriend. Eating healthy. She is going to exercise regularly.  She is a member of [employer]  [facility] - confirmed she is taking medication as prescribed. Wears glasses. Denies problems with vision.  New prescription.  Has glaucoma. Denies problems with feet.  Has appt in June. Some peripheral edema. Nocturia x 4-5 times per night.  Review of Systems A comprehensive review of systems was negative except for: Eyes: positive for contacts/glasses and glaucoma Cardiovascular: positive for lower extremity edema Genitourinary: positive for nocturia    Objective:    BP 140/90 mmHg  Pulse 86  Temp(Src) 98.2 F (36.8 C) (Oral)  Resp 18  Ht 5' 6"  (1.676 m)  Wt 232 lb 9.6 oz (105.507 kg)  BMI 37.56 kg/m2  SpO2 97%  General:  alert, cooperative and no distress  Oropharynx: normal findings: lips normal without lesions and gums healthy   Eyes:  negative findings: lids and lashes normal and conjunctivae and sclerae normal   Ears:  external ears normal        Lung: clear to auscultation bilaterally  Heart:  regular rate and rhythm     Extremities: edema bilateral lower extremities  Skin: dry     Neuro: mental status, speech normal, alert and oriented x3 and gait and station normal   Lab Review GLUCOSE (mg/dL)  Date Value  05/08/2015 218*  12/19/2014 125*  09/04/2014 120*   GLUCOSE, BLD (mg/dL)  Date Value  11/27/2014 96  06/18/2014 188*  02/28/2014 88   CO2 (mmol/L)  Date Value  05/08/2015 19  12/19/2014 23  09/04/2014 22   BUN (mg/dL)  Date Value  05/08/2015 14  12/19/2014 17  11/27/2014  20  09/04/2014 13  06/18/2014 17  02/28/2014 6   CREAT (mg/dL)  Date Value  10/07/2012 0.89   CREATININE, SER (mg/dL)  Date Value  05/08/2015 1.10*  12/19/2014 0.86  11/27/2014 1.10*       Assessment:    Diabetes Mellitus type II, under poor control. A1C much higher,but her home BG is excellent. May have a hemoglobinopathy that can make the A1C result inaccurate.  Her daughter is positive for sickle cell trait. BP at goal <140/90   Plan:    1.  Rx changes: none 2.  Home BG checks do not reflect A1C.  Place LibrePro continuous glucose monitoring device to right upper arm.  This CGM will monitor BG for 2 weeks to better establish patterns for safe medication adjustment. 3.  Continue Toujeo 96 units daily. 4.  Continue Humalog 5 units with meals. 5.  Counseled on nutrition goals. 6.  Praised participation with [employer]. 7.  Counseled on foot care. 8.  BP at goal <140/90 9.  RTC in 2 weeks to interpret CGM and make insulin adjustments if necessary.  May need to check fructosamine level if A1C proves to be inaccurate.

## 2015-05-21 ENCOUNTER — Telehealth: Payer: Self-pay

## 2015-05-21 NOTE — Telephone Encounter (Signed)
Would prefer she not stop her plavix for these minor procedures.  She has had two prior strokes and has poorly controlled diabetes so she's very high risk of another stroke which would be much worse than minor bleeding after a tooth extraction.    You're welcome, Chrae.

## 2015-05-21 NOTE — Telephone Encounter (Signed)
Message left on triage voicemail: Amy from patient's dentist office call to clarify if patient needs to stop plavix prior to a filling.  Per Dr.Reed, No patient does not need to stop plavix prior to a filling.  I called Amy back to inform her of Dr.Reed's response and she then asked for future reference will patient need to stop plavix prior to an extraction. Please advise,  Amy requested that I fax this response to [phone]

## 2015-05-22 NOTE — Telephone Encounter (Signed)
Phone note was faxed yesterday

## 2015-05-22 NOTE — Telephone Encounter (Signed)
After 3 attempts to call patient, patient never returned call

## 2015-05-23 LAB — HM DIABETES EYE EXAM

## 2015-05-24 ENCOUNTER — Encounter: Payer: Self-pay | Admitting: *Deleted

## 2015-06-03 ENCOUNTER — Encounter: Payer: Self-pay | Admitting: Pharmacotherapy

## 2015-06-03 ENCOUNTER — Other Ambulatory Visit: Payer: Self-pay

## 2015-06-03 ENCOUNTER — Ambulatory Visit (INDEPENDENT_AMBULATORY_CARE_PROVIDER_SITE_OTHER): Payer: Medicare Other | Admitting: Pharmacotherapy

## 2015-06-03 VITALS — BP 130/72 | HR 85 | Temp 98.4°F | Ht 66.0 in | Wt 231.0 lb

## 2015-06-03 DIAGNOSIS — N3281 Overactive bladder: Secondary | ICD-10-CM

## 2015-06-03 DIAGNOSIS — E876 Hypokalemia: Secondary | ICD-10-CM

## 2015-06-03 DIAGNOSIS — E1140 Type 2 diabetes mellitus with diabetic neuropathy, unspecified: Secondary | ICD-10-CM

## 2015-06-03 DIAGNOSIS — IMO0002 Reserved for concepts with insufficient information to code with codable children: Secondary | ICD-10-CM

## 2015-06-03 DIAGNOSIS — E1165 Type 2 diabetes mellitus with hyperglycemia: Secondary | ICD-10-CM

## 2015-06-03 DIAGNOSIS — I10 Essential (primary) hypertension: Secondary | ICD-10-CM

## 2015-06-03 MED ORDER — ALBUTEROL SULFATE HFA 108 (90 BASE) MCG/ACT IN AERS: INHALATION_SPRAY | RESPIRATORY_TRACT | Status: DC

## 2015-06-03 MED ORDER — POTASSIUM CHLORIDE CRYS ER 20 MEQ PO TBCR: 40.0000 meq | EXTENDED_RELEASE_TABLET | Freq: Every day | ORAL | Status: DC

## 2015-06-03 MED ORDER — PANTOPRAZOLE SODIUM 40 MG PO TBEC: 40.0000 mg | DELAYED_RELEASE_TABLET | Freq: Every day | ORAL | Status: DC

## 2015-06-03 MED ORDER — HYDRALAZINE HCL 25 MG PO TABS: 25.0000 mg | ORAL_TABLET | Freq: Three times a day (TID) | ORAL | Status: DC

## 2015-06-03 MED ORDER — VERAPAMIL HCL ER 240 MG PO CP24: ORAL_CAPSULE | ORAL | Status: DC

## 2015-06-03 MED ORDER — MIRABEGRON ER 50 MG PO TB24: 50.0000 mg | ORAL_TABLET | Freq: Every day | ORAL | Status: DC

## 2015-06-03 MED ORDER — CLOPIDOGREL BISULFATE 75 MG PO TABS: 75.0000 mg | ORAL_TABLET | Freq: Every day | ORAL | Status: DC

## 2015-06-03 MED ORDER — RANITIDINE HCL 150 MG PO TABS: 150.0000 mg | ORAL_TABLET | Freq: Two times a day (BID) | ORAL | Status: DC

## 2015-06-03 MED ORDER — SIMVASTATIN 20 MG PO TABS: ORAL_TABLET | ORAL | Status: DC

## 2015-06-03 NOTE — Patient Instructions (Signed)
Decrease Toujeo to 86 units daily

## 2015-06-03 NOTE — Progress Notes (Signed)
  Subjective:    [name] is a 50 y.o.African Amercian female who presents for follow-up of Type 2 diabetes mellitus.   A1C was higher than expected. She has been wearing the LibrePro CGM x 14 days.  Average BG:  168m/dl per meter. Average CGM:  852mdl Hypoglycemia x 23 times - usually overnight, but consistently throughout the day. Estimated A1C:  4.7% Days available for interpretation:  15 days.  Time spent in target range (70-150):  54% Time spent above target range:  7% Time spent below target range:  39%  Average duration of hypoglycemia:  350 minutes    Review of Systems A comprehensive review of systems was negative except for: Genitourinary: positive for nocturia    Objective:    BP 130/72 mmHg  Pulse 85  Temp(Src) 98.4 F (36.9 C) (Oral)  Ht 5' 6"  (1.676 m)  Wt 231 lb (104.781 kg)  BMI 37.30 kg/m2  SpO2 97%  General:  alert, cooperative and no distress  Oropharynx: normal findings: lips normal without lesions and gums healthy   Eyes:  negative findings: lids and lashes normal and conjunctivae and sclerae normal   Ears:  external ears normal        Lung: clear to auscultation bilaterally  Heart:  regular rate and rhythm     Extremities: no edema, redness or tenderness in the calves or thighs  Skin: warm and dry, no hyperpigmentation, vitiligo, or suspicious lesions     Neuro: mental status, speech normal, alert and oriented x3 and gait and station normal   Lab Review GLUCOSE (mg/dL)  Date Value  05/08/2015 218*  12/19/2014 125*  09/04/2014 120*   GLUCOSE, BLD (mg/dL)  Date Value  11/27/2014 96  06/18/2014 188*  02/28/2014 88   CO2 (mmol/L)  Date Value  05/08/2015 19  12/19/2014 23  09/04/2014 22   BUN (mg/dL)  Date Value  05/08/2015 14  12/19/2014 17  11/27/2014 20  09/04/2014 13  06/18/2014 17  02/28/2014 6   CREAT (mg/dL)  Date Value  10/07/2012 0.89   CREATININE, SER (mg/dL)  Date Value  05/08/2015 1.10*   12/19/2014 0.86  11/27/2014 1.10*       Assessment:    Diabetes Mellitus type II, under excellent control.  CGM estimates A1C to be 4.7%; however, having multiple episodes of hypoglycemia. BP at goal <140/90   Plan:    1.  Rx changes: decrease Toujeo 86 units daily 2.  Continue Humalog 5 units with each meal. 3.  Reviewed results of CGM with Dr ReMariea Clontsnd patient.  With her family hx of sickle cell disease, she may also have a hemoglobinopathy that would make the A1C not an ideal test for DM control.  Will check fructosamine with future A1C checks. 4.  Counseled on hypoglycemia.  If continues to experience nightly lows - will need to further reduce her Toujeo. 5.  BP at goal <140/90.

## 2015-06-04 ENCOUNTER — Encounter: Payer: Self-pay | Admitting: Pulmonary Disease

## 2015-06-04 ENCOUNTER — Ambulatory Visit (INDEPENDENT_AMBULATORY_CARE_PROVIDER_SITE_OTHER): Payer: Medicare Other | Admitting: Pulmonary Disease

## 2015-06-04 VITALS — BP 140/90 | HR 89 | Ht 66.0 in | Wt 234.4 lb

## 2015-06-04 DIAGNOSIS — J984 Other disorders of lung: Secondary | ICD-10-CM | POA: Diagnosis not present

## 2015-06-04 DIAGNOSIS — G4733 Obstructive sleep apnea (adult) (pediatric): Secondary | ICD-10-CM | POA: Diagnosis not present

## 2015-06-04 NOTE — Assessment & Plan Note (Signed)
Asthma is well controlled-decrease Symbicort to 1 puff at night, can stop this after 3 months

## 2015-06-04 NOTE — Progress Notes (Signed)
   Subjective:    Patient ID: [name], female    DOB: 10/13/1965, 50 y.o.   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  She was evaluated in 06/2013 for asthma and chronic bronchitis She quit smoking in 1989, about 20 pack years. She reports exposure to secondhand smoke-her husband smoked about 3 packs per day.   06/04/2015  Chief Complaint  Patient presents with  . Follow-up    doing well on CPAP.  Has had some leakage, but it is better now after [facility] contacted her.  There are some times that she has been off the CPAP because of her blood sugars dropping too low, had breathing trouble and had to remove mask.     She denies any flareup of asthma or nocturnal wheezing She has been using 2 puffs of Symbicort every night She does not have to use albuterol for rescue very often  She has been more compliant with her CPAP were addressed use it for 4-6 hours every night. She is maintained on auto 4-12 cm with a full face mask She denies mask and pressure issues and no dryness DME has been providing supplies in time Review of download shows no residuals with average pressure of 11.5 cm and occasional leak. This is much improved from her prior download report   Significant tests/ events PSG 02/2013 -wt - 245 pounds -total sleep time 349 minutes, AHI 6 per hour, lowest desat 82% PFTs 06/2013 -moderate restriction, ratio normal   Review of Systems Patient denies significant dyspnea,cough, hemoptysis,  chest pain, palpitations, pedal edema, orthopnea, paroxysmal nocturnal dyspnea, lightheadedness, nausea, vomiting, abdominal or  leg pains      Objective:   Physical Exam  Gen. Pleasant, obese, in no distress ENT - no lesions, no post nasal drip Neck: No JVD, no thyromegaly, no carotid bruits Lungs: no use of accessory muscles, no dullness to percussion,  decreased without rales or rhonchi  Cardiovascular: Rhythm regular, heart sounds  normal, no murmurs or gallops, no peripheral edema Musculoskeletal: No deformities, no cyanosis or clubbing , no tremors       Assessment & Plan:

## 2015-06-04 NOTE — Patient Instructions (Signed)
Asthma is well controlled-decrease Symbicort to 1 puff at night, can stop this after 3 months  CPAP supplies will be renewed for a year Congratulations on better usage Work on weight loss another 10 pounds

## 2015-06-04 NOTE — Assessment & Plan Note (Signed)
CPAP supplies will be renewed for a year Congratulations on better usage Work on weight loss another 10 pounds  Weight loss encouraged, compliance with goal of at least 4-6 hrs every night is the expectation. Advised against medications with sedative side effects Cautioned against driving when sleepy - understanding that sleepiness will vary on a day to day basis

## 2015-06-07 ENCOUNTER — Other Ambulatory Visit: Payer: Self-pay | Admitting: *Deleted

## 2015-06-07 DIAGNOSIS — N3281 Overactive bladder: Secondary | ICD-10-CM

## 2015-06-07 DIAGNOSIS — E1121 Type 2 diabetes mellitus with diabetic nephropathy: Secondary | ICD-10-CM

## 2015-06-07 DIAGNOSIS — E1165 Type 2 diabetes mellitus with hyperglycemia: Principal | ICD-10-CM

## 2015-06-07 MED ORDER — BUDESONIDE-FORMOTEROL FUMARATE 160-4.5 MCG/ACT IN AERO: 2.0000 | INHALATION_SPRAY | Freq: Every day | RESPIRATORY_TRACT | Status: DC

## 2015-06-07 MED ORDER — LISINOPRIL 10 MG PO TABS: 10.0000 mg | ORAL_TABLET | Freq: Every day | ORAL | Status: DC

## 2015-06-07 MED ORDER — SIMVASTATIN 20 MG PO TABS: ORAL_TABLET | ORAL | Status: DC

## 2015-06-07 MED ORDER — RANITIDINE HCL 150 MG PO TABS: 150.0000 mg | ORAL_TABLET | Freq: Two times a day (BID) | ORAL | Status: DC

## 2015-06-07 MED ORDER — TOPIRAMATE 200 MG PO TABS: 200.0000 mg | ORAL_TABLET | Freq: Two times a day (BID) | ORAL | Status: DC

## 2015-06-07 MED ORDER — INSULIN LISPRO 200 UNIT/ML ~~LOC~~ SOPN: 5.0000 [IU] | PEN_INJECTOR | Freq: Three times a day (TID) | SUBCUTANEOUS | Status: DC

## 2015-06-07 MED ORDER — IBUPROFEN 600 MG PO TABS: 600.0000 mg | ORAL_TABLET | Freq: Three times a day (TID) | ORAL | Status: DC | PRN

## 2015-06-07 MED ORDER — MIRABEGRON ER 50 MG PO TB24: 50.0000 mg | ORAL_TABLET | Freq: Every day | ORAL | Status: DC

## 2015-06-07 MED ORDER — ALBUTEROL SULFATE HFA 108 (90 BASE) MCG/ACT IN AERS: INHALATION_SPRAY | RESPIRATORY_TRACT | Status: DC

## 2015-06-07 MED ORDER — HYDRALAZINE HCL 25 MG PO TABS: 25.0000 mg | ORAL_TABLET | Freq: Three times a day (TID) | ORAL | Status: DC

## 2015-06-07 MED ORDER — GABAPENTIN 300 MG PO CAPS: 600.0000 mg | ORAL_CAPSULE | Freq: Three times a day (TID) | ORAL | Status: DC

## 2015-06-07 NOTE — Telephone Encounter (Signed)
[facility].  Patient also called and confirmed change in pharmacy. Stated that she has gone with 90 day supplies.

## 2015-06-07 NOTE — Telephone Encounter (Signed)
[facility] 

## 2015-06-10 ENCOUNTER — Other Ambulatory Visit: Payer: Self-pay | Admitting: *Deleted

## 2015-06-10 DIAGNOSIS — E876 Hypokalemia: Secondary | ICD-10-CM

## 2015-06-10 MED ORDER — POLYETHYLENE GLYCOL 3350 17 G PO PACK: 17.0000 g | PACK | Freq: Every day | ORAL | Status: DC | PRN

## 2015-06-10 MED ORDER — PANTOPRAZOLE SODIUM 40 MG PO TBEC: 40.0000 mg | DELAYED_RELEASE_TABLET | Freq: Every day | ORAL | Status: DC

## 2015-06-10 MED ORDER — POTASSIUM CHLORIDE CRYS ER 20 MEQ PO TBCR: 40.0000 meq | EXTENDED_RELEASE_TABLET | Freq: Every day | ORAL | Status: DC

## 2015-06-10 NOTE — Telephone Encounter (Signed)
[facility] 

## 2015-06-11 ENCOUNTER — Encounter: Payer: Self-pay | Admitting: *Deleted

## 2015-06-11 ENCOUNTER — Telehealth: Payer: Self-pay | Admitting: *Deleted

## 2015-06-11 NOTE — Telephone Encounter (Signed)
Pt has a very complicated medical history, restrictive airway dx, OSA, colitis, MI, DM, GERD, HX CVA, seizures.  Pt is also on plavix due to her old CVA's per pt.   Called pt and instructed due to this we need her to have an OV instead of a PV. ( had left multiple messages yesterday with no return call )  Put pt on hold to call 3rd floor because no Ov showing with Dr Loletha Carrow, instructed pt to NOT hang up, made Ov with Barbera Setters, pt had hung up. Attempted to call her back numerous times, all to voice mail .  LM of appointment date and time as pt needs to call SS and cancel the PV 5-30 and colon 6-13 transportation and then get transportation for the 6-29 OV.   Also mailed letter with this information to pt as well.  Will continue to reach patient today with information as well Lelan Pons PV

## 2015-06-12 ENCOUNTER — Encounter: Payer: Self-pay | Admitting: Pulmonary Disease

## 2015-06-12 ENCOUNTER — Other Ambulatory Visit: Payer: Self-pay | Admitting: *Deleted

## 2015-06-12 MED ORDER — CLOPIDOGREL BISULFATE 75 MG PO TABS: 75.0000 mg | ORAL_TABLET | Freq: Every day | ORAL | Status: DC

## 2015-06-12 MED ORDER — VERAPAMIL HCL ER 240 MG PO CP24: ORAL_CAPSULE | ORAL | Status: DC

## 2015-06-12 MED ORDER — FLUTICASONE PROPIONATE 50 MCG/ACT NA SUSP: 2.0000 | Freq: Every day | NASAL | Status: DC

## 2015-06-12 MED ORDER — GLUCOSE BLOOD VI STRP: 1.0000 | ORAL_STRIP | Freq: Three times a day (TID) | Status: DC

## 2015-06-12 NOTE — Telephone Encounter (Signed)
[facility] 

## 2015-06-19 NOTE — Telephone Encounter (Addendum)
Patient called and left message wanting to know the status of her Handicap placard. Called patient back and asked patient the reason for the placard and she got defensive and stated that so she can park closer when her fiancee takes her places. Stated that she used her daughter's before when she lived with her but now that she has moved out and on her own she needs her own placard. She also stated that she has a cane. Please Advise.

## 2015-06-20 ENCOUNTER — Telehealth: Payer: Self-pay | Admitting: Pulmonary Disease

## 2015-06-20 DIAGNOSIS — G4733 Obstructive sleep apnea (adult) (pediatric): Secondary | ICD-10-CM

## 2015-06-20 NOTE — Telephone Encounter (Signed)
Ok to have her DME replace needed supplies      Dx OSA

## 2015-06-20 NOTE — Telephone Encounter (Signed)
Spoke with pt. States that she needs an order sent to [facility] for new CPAP supplies. This was to be taken care of at her last OV and this order was not placed. RA is not here to electronically sign this order. CY would you be willing to write this order?

## 2015-06-20 NOTE — Telephone Encounter (Signed)
Order has been placed per CY. Pt is aware. Nothing further was needed.

## 2015-06-24 ENCOUNTER — Telehealth: Payer: Self-pay | Admitting: Pulmonary Disease

## 2015-06-24 DIAGNOSIS — R000 Tachycardia, unspecified: Secondary | ICD-10-CM

## 2015-06-24 NOTE — Telephone Encounter (Signed)
lmtcb x1 for pt. 

## 2015-06-25 NOTE — Telephone Encounter (Signed)
lmtcb x2 for pt. 

## 2015-06-25 NOTE — Telephone Encounter (Signed)
Pt returning call [phone] .[name]

## 2015-06-26 NOTE — Telephone Encounter (Signed)
LMTCB

## 2015-06-27 ENCOUNTER — Telehealth: Payer: Self-pay

## 2015-06-27 NOTE — Telephone Encounter (Signed)
Form signed.

## 2015-06-27 NOTE — Telephone Encounter (Signed)
A request for prescription clarification was received from [facility] for Verelan 240 mg 24 hr capsules. The fax stated the following:"Please verify the drug and strength to be dispensed for Verelan. Verelan PM is available as 120m, 2066m and 30072mapsules. Verelan SR is available as 240m7mpsules. Thanks".  After speaking with Dr. ReedMariea Clontse medication was verified to be Verelan SR 240 mg 24 hr capsules. Take one capsule by mouth every night at bedtime for blood pressure.   Form was completed and placed in in Dr. ReedCyndi Lennert for signing.

## 2015-07-01 ENCOUNTER — Telehealth: Payer: Self-pay | Admitting: Pulmonary Disease

## 2015-07-01 NOTE — Telephone Encounter (Signed)
Patient returned call, CB is [phone].  Patient states she has gotten her new CPAP supplies.

## 2015-07-01 NOTE — Telephone Encounter (Signed)
Spoke with pt. States that she received her CPAP supplies over the weekend. Pt did not need anything further. Message will be closed.

## 2015-07-01 NOTE — Telephone Encounter (Signed)
Per telephone encounter from 06/24/2015, pt needs new CPAP supplies.  lmtcb x1 for pt.

## 2015-07-01 NOTE — Telephone Encounter (Signed)
lmtcb for pt. Message will be closed per triage protocol.

## 2015-07-02 ENCOUNTER — Encounter: Payer: Self-pay | Admitting: Gastroenterology

## 2015-07-18 ENCOUNTER — Encounter: Payer: Self-pay | Admitting: Gastroenterology

## 2015-07-18 ENCOUNTER — Ambulatory Visit (INDEPENDENT_AMBULATORY_CARE_PROVIDER_SITE_OTHER): Payer: Medicare Other | Admitting: Gastroenterology

## 2015-07-18 VITALS — BP 124/80 | HR 76 | Ht 66.0 in | Wt 230.8 lb

## 2015-07-18 DIAGNOSIS — Z1211 Encounter for screening for malignant neoplasm of colon: Secondary | ICD-10-CM

## 2015-07-18 DIAGNOSIS — E1140 Type 2 diabetes mellitus with diabetic neuropathy, unspecified: Secondary | ICD-10-CM

## 2015-07-18 DIAGNOSIS — IMO0002 Reserved for concepts with insufficient information to code with codable children: Secondary | ICD-10-CM

## 2015-07-18 DIAGNOSIS — G4733 Obstructive sleep apnea (adult) (pediatric): Secondary | ICD-10-CM | POA: Diagnosis not present

## 2015-07-18 DIAGNOSIS — E1165 Type 2 diabetes mellitus with hyperglycemia: Secondary | ICD-10-CM

## 2015-07-18 DIAGNOSIS — I69359 Hemiplegia and hemiparesis following cerebral infarction affecting unspecified side: Secondary | ICD-10-CM

## 2015-07-18 NOTE — Progress Notes (Signed)
[facility] Consult Note:  History: [name] 07/18/2015  Referring physician: Lauree Chandler, NP  Reason for consult/chief complaint: discuss colonoscopy   Subjective HPI:  This woman was referred to [name] for colon cancer screening. She denies abdominal pain altered bowel habits rectal bleeding or family history of colon cancer. She has multiple medical issues as described below. She is a somewhat limited historian, so some of the details are unclear. She is clearly had at least 1 CVA last year leading her with some left-sided weakness. She reports that she has had "8 strokes". She also reports prolonged hospital stay for cardiac issues after hernia surgery in 2013. She is on chronic Plavix due to her cerebrovascular disease. Review of recent primary care notes indicate she has had multiple episodes of low glucose in the morning.    ROS:  Review of Systems  She denies chest pain or dyspnea. Positive for left-sided weakness. Past Medical History: Past Medical History  Diagnosis Date  . Hypertension   . Asthma   . Seizures ([place])   . Heart murmur     born with   . Dysrhythmia     skipped beats  . Myocardial infarction ([place])     hi of x 2  last one 2003   . COPD (chronic obstructive pulmonary disease) ([facility])     chronic bronchitis   . Pneumonia     hx of 2009  . GERD (gastroesophageal reflux disease)   . Headache(784.0)     migraines   . Neuropathy ([place])     due to diabetes   . Dizziness     secondary to diabetes and hypertension   . Arthritis     joint pain   . Hx of blood clots     hematomas removed from left side of brain from 59mo to 89yrold   . Epilepsy idiopathic petit mal ([place]    last seizure 2012;controlled w/ topomax  . Diabetic neuropathy, painful ([place]    FEET AND HANDS  . Diabetes mellitus     since age [age]type 2 IDDM  . Sleep apnea     [facility];does not [facility] Cp[facility] ; mild  . MIGRAINE HEADACHE 09/14/2006     Qualifier: Diagnosis of  By: MaHassell DoneNP, NyTori Milks  . Glaucoma     NOT ON ANY EYE DROPS   . Legally blind     left eye   . [facility]    last 2003  RESIDUAL LEFT LEG WEAKNESS--NO OTHER RESIDUAL PROBLEMS  . CVA 11/14/2007    Qualifier: Diagnosis of  By: MaHassell DoneNP, NyTori Milks  . Pain 09/23/11    LOWER BACK AND RIGHT SIDE--PT HAS RIGHT URETERAL STONE  . Nephrolithiasis     frequent urination , urination at nite  PT SEEN IN ER 09/22/11 FOR BACK AND RT SIDED PAIN--HAS KNOWN STONE RT URETER AND UA IN ER SHOWED UTI  . Anxiety   . Depression   . Stress incontinence   . Hyperlipidemia   . Pyelonephritis      Past Surgical History: Past Surgical History  Procedure Laterality Date  . Kidney stone removal    . Brain hematoma evacuation      five procedures total, first procedure when [age]onths old, last at [age]ears of age.  . Shunt removal      shunt inserted at age [age]emoved at age 50 . Retinal detachment surgery  Left 1990  . Vaginal hysterectomy  1996  . Incisional hernia repair  05/05/2011    Procedure: LAPAROSCOPIC INCISIONAL HERNIA;  Surgeon: Rolm Bookbinder, MD;  Location: [facility];  Service: General;  Laterality: N/A;  . Mass excision  05/05/2011    Procedure: EXCISION MASS;  Surgeon: Rolm Bookbinder, MD;  Location: [facility];  Service: General;  Laterality: Right;  . Pcnl    . Cystoscopy/retrograde/ureteroscopy  08/24/2011    Procedure: CYSTOSCOPY/RETROGRADE/URETEROSCOPY;  Surgeon: Molli Hazard, MD;  Location: [facility];  Service: Urology;  Laterality: Right;  Cysto, Right retrograde Pyelogram, right stent placement.   . Cystoscopy with ureteroscopy  09/24/2011    Procedure: CYSTOSCOPY WITH URETEROSCOPY;  Surgeon: Malka So, MD;  Location: [facility];  Service: Urology;  Laterality: Right;  Balloon dilation right ureter   . Cystoscopy w/ retrogrades  09/24/2011    Procedure: CYSTOSCOPY WITH RETROGRADE PYELOGRAM;  Surgeon: Malka So, MD;  Location: [facility];  Service: Urology;   Laterality: Right;     Family History: Family History  Problem Relation Age of Onset  . Uterine cancer Mother   . Hypertension Mother   . Brain cancer Maternal Grandmother   . Hypertension Maternal Grandmother   . Anesthesia problems Neg Hx   . Colon cancer Neg Hx   . Esophageal cancer Neg Hx   . Pancreatic cancer Neg Hx   . Cirrhosis Maternal Grandfather    No crc Social History: Social History   Social History  . Marital Status: Widowed    Spouse Name: N/A  . Number of Children: 2  . Years of Education: N/A   Social History Main Topics  . Smoking status: Former Smoker -- 1.00 packs/day for 30 years    Types: Cigarettes    Quit date: 01/20/1987  . Smokeless tobacco: Never Used     Comment: quit more than 20years  . Alcohol Use: 0.0 oz/week    0 Standard drinks or equivalent per week     Comment: 1 glass weekly  . Drug Use: No     Comment: hx of marijuana use no longer uses   . Sexual Activity: Not Currently    Birth Control/ Protection: Surgical   Other Topics Concern  . None   Social History Narrative    Allergies: Allergies  Allergen Reactions  . Codeine Swelling and Other (See Comments)    Swelling and burning of mouth (inside)  . Lactose Intolerance (Gi) Nausea And Vomiting    Outpatient Meds: Current Outpatient Prescriptions  Medication Sig Dispense Refill  . albuterol (PROAIR HFA) 108 (90 Base) MCG/ACT inhaler INHALE 2 PUFFS BY MOUTH EVERY 6 HOURS AS NEEDED FOR WHEEZING OR SHORTNESS OF BREATH 3 Inhaler 3  . AMBULATORY NON FORMULARY MEDICATION Medication Name: Muscle and Joint Vanishing Scent Gel- Apply as needed to skin    . baclofen (LIORESAL) 10 MG tablet TAKE 1 TABLET BY MOUTH 3 TIMES A DAY AS NEEDED FOR MUSCLE SPASMS 90 tablet 4  . budesonide-formoterol (SYMBICORT) 160-4.5 MCG/ACT inhaler Inhale 2 puffs into the lungs daily. 3 Inhaler 3  . Calcium Carbonate-Vitamin D (CALCIUM-VITAMIN D3 PO) D3 500- calcium 630 mg Take 1 capsule by mouth once  daily    . clopidogrel (PLAVIX) 75 MG tablet Take 1 tablet (75 mg total) by mouth daily. 90 tablet 3  . cycloSPORINE (RESTASIS) 0.05 % ophthalmic emulsion Place 2 drops into both eyes 2 (two) times daily.    . fluorometholone (FML) 0.1 % ophthalmic suspension Place 2  drops into both eyes 2 (two) times daily.    . fluticasone (FLONASE) 50 MCG/ACT nasal spray Place 2 sprays into both nostrils daily. 16 g 3  . gabapentin (NEURONTIN) 300 MG capsule Take 2 capsules (600 mg total) by mouth 3 (three) times daily. 360 capsule 3  . glucose blood (ONETOUCH VERIO) test strip 1 each by Other route 3 (three) times daily. E11.29 300 each 3  . hydrALAZINE (APRESOLINE) 25 MG tablet Take 1 tablet (25 mg total) by mouth 3 (three) times daily. 270 tablet 3  . ibuprofen (ADVIL,MOTRIN) 600 MG tablet Take 1 tablet (600 mg total) by mouth every 8 (eight) hours as needed. for pain 270 tablet 3  . Insulin Glargine (TOUJEO SOLOSTAR) 300 UNIT/ML SOPN Inject 86 Units into the skin daily. Reported on 07/18/2015    . Insulin Lispro (HUMALOG KWIKPEN) 200 UNIT/ML SOPN Inject 5 Units into the skin 3 (three) times daily with meals. 15 pen 3  . lisinopril (PRINIVIL,ZESTRIL) 10 MG tablet Take 1 tablet (10 mg total) by mouth daily. 90 tablet 3  . mirabegron ER (MYRBETRIQ) 50 MG TB24 tablet Take 1 tablet (50 mg total) by mouth daily. 90 tablet 3  . ondansetron (ZOFRAN-ODT) 4 MG disintegrating tablet TAKE 1 TABLET BY MOUTH EVERY 8 HOURS AS NEEDED FOR NAUSEA OR VOMITING 20 tablet 0  . pantoprazole (PROTONIX) 40 MG tablet Take 1 tablet (40 mg total) by mouth daily. 90 tablet 3  . polyethylene glycol (MIRALAX / GLYCOLAX) packet Take 17 g by mouth daily as needed for mild constipation. 100 each 3  . potassium chloride SA (K-DUR,KLOR-CON) 20 MEQ tablet Take 2 tablets (40 mEq total) by mouth daily. 180 tablet 1  . ranitidine (ZANTAC) 150 MG tablet Take 1 tablet (150 mg total) by mouth 2 (two) times daily. 180 tablet 3  . simvastatin (ZOCOR) 20  MG tablet Take one tablet by mouth every morning for cholesterol 90 tablet 3  . topiramate (TOPAMAX) 200 MG tablet Take 1 tablet (200 mg total) by mouth 2 (two) times daily. 180 tablet 3  . verapamil (VERELAN PM) 240 MG 24 hr capsule TAKE ONE CAPSULE BY MOUTH EVERY NIGHT AT BEDTIME FOR BLOOD PRESSURE 90 capsule 3   No current facility-administered medications for this visit.      ___________________________________________________________________ Objective  Exam:  BP 124/80 mmHg  Pulse 76  Ht 5' 6"  (1.676 m)  Wt 230 lb 12.8 oz (104.69 kg)  BMI 37.27 kg/m2   General: this is a(n) Chronically ill-appearing middle-aged woman. She is ambulatory with mild left-sided weakness, speech is slow but fluent.   Eyes: sclera anicteric, no redness  ENT: oral mucosa moist without lesions, no cervical or supraclavicular lymphadenopathy, good dentition  CV: RRR without murmur, S1/S2, no JVD, no peripheral edema  Resp: clear to auscultation bilaterally, normal RR and effort noted  GI: soft, no tenderness, with active bowel sounds. No guarding or palpable organomegaly noted.  Skin; warm and dry, no rash or jaundice noted  Labs:  CMP Latest Ref Rng 05/08/2015 12/19/2014 11/27/2014  Glucose 65 - 99 mg/dL 218(H) 125(H) 96  BUN 6 - 24 mg/dL 14 17 20   Creatinine 0.57 - 1.00 mg/dL 1.10(H) 0.86 1.10(H)  Sodium 134 - 144 mmol/L 138 139 141  Potassium 3.5 - 5.2 mmol/L 4.2 4.0 4.0  Chloride 96 - 106 mmol/L 102 104 106  CO2 18 - 29 mmol/L 19 23 -  Calcium 8.7 - 10.2 mg/dL 9.8 9.1 -  Total Protein 6.0 -  8.5 g/dL 7.7 - -  Total Bilirubin 0.0 - 1.2 mg/dL 0.3 - -  Alkaline Phos 39 - 117 IU/L 81 - -  AST 0 - 40 IU/L 9 - -  ALT 0 - 32 IU/L 12 - -    CBC    Component Value Date/Time   WBC 7.7 05/08/2015 1106   WBC 5.8 11/27/2014 1210   RBC 4.34 05/08/2015 1106   RBC 3.93 11/27/2014 1210   HGB 12.6 11/27/2014 1219   HCT 39.5 05/08/2015 1106   HCT 37.0 11/27/2014 1219   PLT 274 05/08/2015 1106    PLT 254 11/27/2014 1210   MCV 91 05/08/2015 1106   MCV 92.1 11/27/2014 1210   MCH 30.2 05/08/2015 1106   MCH 29.3 11/27/2014 1210   MCHC 33.2 05/08/2015 1106   MCHC 31.8 11/27/2014 1210   RDW 14.1 05/08/2015 1106   RDW 14.2 11/27/2014 1210   LYMPHSABS 1.7 05/08/2015 1106   LYMPHSABS 1.5 11/27/2014 1210   MONOABS 0.6 11/27/2014 1210   EOSABS 0.0 05/08/2015 1106   EOSABS 0.0 11/27/2014 1210   EOSABS 0.0 03/15/2014 1524   BASOSABS 0.0 05/08/2015 1106   BASOSABS 0.0 11/27/2014 1210     Hgb A1c 9.5 on 05/08/15   Assessment: Encounter Diagnoses  Name Primary?  . Special screening for malignant neoplasms, colon Yes  . OSA (obstructive sleep apnea)   . CVA, old, hemiparesis ([place])   . Obesity, Class III, BMI 40-49.9 (morbid obesity) ([place])   . DM type 2, uncontrolled, with neuropathy ([place])    Given her multiple medical issues, I am concerned about the increased risks of colonoscopy.  Plan: We have made arrangements for a colo-guard test, because I think it is the best balance of risks and benefits and this patient. If positive, she must undergo colonoscopy. Thank you for the courtesy of this consult.  Please call me with any questions or concerns.  Nelida Meuse III  CC: Lauree Chandler, NP

## 2015-07-18 NOTE — Patient Instructions (Signed)
   We have sent your demographic and insurance information to [employer]. They should contact you within the next week regarding your Cologuard (colon cancer screening) test. If you have not heard from them within the next week, please call our office at [phone].   Normal BMI (Body Mass Index- based on height and weight) is between 19 and 25. Your BMI today is Body mass index is 37.27 kg/(m^2). [name] Please consider follow up  regarding your BMI with your Primary Care Provider.    I appreciate the opportunity to care for you. Wilfrid Lund, MD

## 2015-07-24 ENCOUNTER — Other Ambulatory Visit: Payer: Medicare Other

## 2015-07-25 ENCOUNTER — Other Ambulatory Visit: Payer: Self-pay

## 2015-07-25 DIAGNOSIS — IMO0002 Reserved for concepts with insufficient information to code with codable children: Secondary | ICD-10-CM

## 2015-07-25 DIAGNOSIS — E1165 Type 2 diabetes mellitus with hyperglycemia: Principal | ICD-10-CM

## 2015-07-25 DIAGNOSIS — E1140 Type 2 diabetes mellitus with diabetic neuropathy, unspecified: Secondary | ICD-10-CM

## 2015-07-26 ENCOUNTER — Other Ambulatory Visit: Payer: Self-pay | Admitting: Pharmacotherapy

## 2015-07-26 ENCOUNTER — Other Ambulatory Visit: Payer: Medicare Other

## 2015-07-26 DIAGNOSIS — E1165 Type 2 diabetes mellitus with hyperglycemia: Principal | ICD-10-CM

## 2015-07-26 DIAGNOSIS — IMO0002 Reserved for concepts with insufficient information to code with codable children: Secondary | ICD-10-CM

## 2015-07-26 DIAGNOSIS — E1140 Type 2 diabetes mellitus with diabetic neuropathy, unspecified: Secondary | ICD-10-CM

## 2015-07-26 LAB — COMPLETE METABOLIC PANEL WITH GFR
ALT: 11 U/L (ref 6–29)
AST: 8 U/L — ABNORMAL LOW (ref 10–35)
Albumin: 3.8 g/dL (ref 3.6–5.1)
Alkaline Phosphatase: 62 U/L (ref 33–130)
BUN: 14 mg/dL (ref 7–25)
CO2: 19 mmol/L — ABNORMAL LOW (ref 20–31)
Calcium: 9.1 mg/dL (ref 8.6–10.4)
Chloride: 110 mmol/L (ref 98–110)
Creat: 1.05 mg/dL (ref 0.50–1.05)
GFR, Est African American: 72 mL/min (ref >=60)
GFR, Est Non African American: 62 mL/min (ref >=60)
Glucose, Bld: 203 mg/dL — ABNORMAL HIGH (ref 65–99)
Potassium: 4.2 mmol/L (ref 3.5–5.3)
Sodium: 137 mmol/L (ref 135–146)
Total Bilirubin: 0.3 mg/dL (ref 0.2–1.2)
Total Protein: 6.9 g/dL (ref 6.1–8.1)

## 2015-07-26 LAB — HEMOGLOBIN A1C
Hgb A1c MFr Bld: 7.6 % — ABNORMAL HIGH (ref <5.7)
Mean Plasma Glucose: 171 mg/dL

## 2015-07-29 ENCOUNTER — Encounter: Payer: Self-pay | Admitting: Pharmacotherapy

## 2015-07-29 ENCOUNTER — Ambulatory Visit (INDEPENDENT_AMBULATORY_CARE_PROVIDER_SITE_OTHER): Payer: Medicare Other | Admitting: Pharmacotherapy

## 2015-07-29 VITALS — BP 118/86 | HR 81 | Temp 98.1°F | Resp 18 | Ht 66.0 in | Wt 233.4 lb

## 2015-07-29 DIAGNOSIS — I10 Essential (primary) hypertension: Secondary | ICD-10-CM

## 2015-07-29 DIAGNOSIS — E1140 Type 2 diabetes mellitus with diabetic neuropathy, unspecified: Secondary | ICD-10-CM | POA: Diagnosis not present

## 2015-07-29 DIAGNOSIS — IMO0002 Reserved for concepts with insufficient information to code with codable children: Secondary | ICD-10-CM

## 2015-07-29 DIAGNOSIS — E1165 Type 2 diabetes mellitus with hyperglycemia: Secondary | ICD-10-CM | POA: Diagnosis not present

## 2015-07-29 LAB — FRUCTOSAMINE: Fructosamine: 264 umol/L (ref 190–270)

## 2015-07-29 NOTE — Patient Instructions (Signed)
Keep up the good work

## 2015-07-29 NOTE — Progress Notes (Signed)
  Subjective:    [name] is a 50 y.o.African American female who presents for follow-up of Type 2 diabetes mellitus.   A1C much better - now 7.6% (was 9.5%)  Home BG are good.  She reports average 130-150 range. Overnight hypoglycemia is rare. Currently on Toujeo 86 units daily Currently on Humalog 5 units with each meal.  Eating healthy choices.  Losing weight in a healthy way. She is now exercising on a regular basis. Using an exercise bike.  Wears glasses.  Has glaucoma check in November 2017.  Also has cataracts.. Some peripheral edema No problems with feet. Nocturia 3-4 times per night.    Review of Systems A comprehensive review of systems was negative except for: Eyes: positive for cataracts and glaucoma    Objective:    BP 118/86 mmHg  Pulse 81  Temp(Src) 98.1 F (36.7 C) (Oral)  Resp 18  Ht 5' 6"  (1.676 m)  Wt 233 lb 6.4 oz (105.87 kg)  BMI 37.69 kg/m2  SpO2 97%  General:  alert, cooperative and no distress  Oropharynx: normal findings: lips normal without lesions and gums healthy   Eyes:  negative findings: lids and lashes normal and conjunctivae and sclerae normal   Ears:  external ears normal        Lung: clear to auscultation bilaterally  Heart:  regular rate and rhythm     Extremities: edema trace bilateral extremities  Skin: warm and dry, no hyperpigmentation, vitiligo, or suspicious lesions     Neuro: mental status, speech normal, alert and oriented x3 and gait and station normal   Lab Review GLUCOSE (mg/dL)  Date Value  05/08/2015 218*  12/19/2014 125*  09/04/2014 120*   GLUCOSE, BLD (mg/dL)  Date Value  07/26/2015 203*  11/27/2014 96  06/18/2014 188*   CO2 (mmol/L)  Date Value  07/26/2015 19*  05/08/2015 19  12/19/2014 23   BUN (mg/dL)  Date Value  07/26/2015 14  05/08/2015 14  12/19/2014 17  11/27/2014 20  09/04/2014 13  06/18/2014 17   CREAT (mg/dL)  Date Value  07/26/2015 1.05  10/07/2012 0.89    CREATININE, SER (mg/dL)  Date Value  05/08/2015 1.10*  12/19/2014 0.86  11/27/2014 1.10*       Assessment:    Diabetes Mellitus type II, under good control. A1C much better.  Closer to goal <7% BP at goal <140/90   Plan:    1.  Rx changes: none 2.  Continue Toujeo 86 units daily. 3.  Continue Humalog 5 units with each meal. 4.  Counseled on nutrition goals.  Praised improved dietary habits. 5.  Exercise goal is 30-45 minutes 5 x week.  Praised improved efforts. 6.  BP at goal <140/90.

## 2015-07-30 ENCOUNTER — Ambulatory Visit: Payer: Medicare Other | Admitting: Nurse Practitioner

## 2015-08-06 ENCOUNTER — Encounter: Payer: Self-pay | Admitting: Nurse Practitioner

## 2015-08-06 ENCOUNTER — Ambulatory Visit (INDEPENDENT_AMBULATORY_CARE_PROVIDER_SITE_OTHER): Payer: Medicare Other | Admitting: Nurse Practitioner

## 2015-08-06 VITALS — BP 132/82 | HR 81 | Temp 98.3°F | Resp 17 | Ht 66.0 in | Wt 236.4 lb

## 2015-08-06 DIAGNOSIS — Z1211 Encounter for screening for malignant neoplasm of colon: Secondary | ICD-10-CM | POA: Diagnosis not present

## 2015-08-06 DIAGNOSIS — I2510 Atherosclerotic heart disease of native coronary artery without angina pectoris: Secondary | ICD-10-CM

## 2015-08-06 DIAGNOSIS — Z0000 Encounter for general adult medical examination without abnormal findings: Secondary | ICD-10-CM

## 2015-08-06 DIAGNOSIS — R569 Unspecified convulsions: Secondary | ICD-10-CM | POA: Diagnosis not present

## 2015-08-06 MED ORDER — FLUTICASONE PROPIONATE 50 MCG/ACT NA SUSP: 2.0000 | Freq: Every day | NASAL | Status: DC

## 2015-08-06 NOTE — Patient Instructions (Signed)
Call and get mammogram scheduled Follow up in 6 months for routine follow up   Health Maintenance, Female Adopting a healthy lifestyle and getting preventive care can go a long way to promote health and wellness. Talk with your health care provider about what schedule of regular examinations is right for you. This is a good chance for you to check in with your provider about disease prevention and staying healthy. In between checkups, there are plenty of things you can do on your own. Experts have done a lot of research about which lifestyle changes and preventive measures are most likely to keep you healthy. Ask your health care provider for more information. WEIGHT AND DIET  Eat a healthy diet  Be sure to include plenty of vegetables, fruits, low-fat dairy products, and lean protein.  Do not eat a lot of foods high in solid fats, added sugars, or salt.  Get regular exercise. This is one of the most important things you can do for your health.  Most adults should exercise for at least 150 minutes each week. The exercise should increase your heart rate and make you sweat (moderate-intensity exercise).  Most adults should also do strengthening exercises at least twice a week. This is in addition to the moderate-intensity exercise.  Maintain a healthy weight  Body mass index (BMI) is a measurement that can be used to identify possible weight problems. It estimates body fat based on height and weight. Your health care provider can help determine your BMI and help you achieve or maintain a healthy weight.  For females 1 years of age and older:   A BMI below 18.5 is considered underweight.  A BMI of 18.5 to 24.9 is normal.  A BMI of 25 to 29.9 is considered overweight.  A BMI of 30 and above is considered obese.  Watch levels of cholesterol and blood lipids  You should start having your blood tested for lipids and cholesterol at 51 years of age, then have this test every 5  years.  You may need to have your cholesterol levels checked more often if:  Your lipid or cholesterol levels are high.  You are older than 51 years of age.  You are at high risk for heart disease.  CANCER SCREENING   Lung Cancer  Lung cancer screening is recommended for adults 77-21 years old who are at high risk for lung cancer because of a history of smoking.  A yearly low-dose CT scan of the lungs is recommended for people who:  Currently smoke.  Have quit within the past 15 years.  Have at least a 30-pack-year history of smoking. A pack year is smoking an average of one pack of cigarettes a day for 1 year.  Yearly screening should continue until it has been 15 years since you quit.  Yearly screening should stop if you develop a health problem that would prevent you from having lung cancer treatment.  Breast Cancer  Practice breast self-awareness. This means understanding how your breasts normally appear and feel.  It also means doing regular breast self-exams. Let your health care provider know about any changes, no matter how small.  If you are in your 20s or 30s, you should have a clinical breast exam (CBE) by a health care provider every 1-3 years as part of a regular health exam.  If you are 59 or older, have a CBE every year. Also consider having a breast X-ray (mammogram) every year.  If you have a family  history of breast cancer, talk to your health care provider about genetic screening.  If you are at high risk for breast cancer, talk to your health care provider about having an MRI and a mammogram every year.  Breast cancer gene (BRCA) assessment is recommended for women who have family members with BRCA-related cancers. BRCA-related cancers include:  Breast.  Ovarian.  Tubal.  Peritoneal cancers.  Results of the assessment will determine the need for genetic counseling and BRCA1 and BRCA2 testing. Cervical Cancer Your health care provider may  recommend that you be screened regularly for cancer of the pelvic organs (ovaries, uterus, and vagina). This screening involves a pelvic examination, including checking for microscopic changes to the surface of your cervix (Pap test). You may be encouraged to have this screening done every 3 years, beginning at age 65.  For women ages 75-65, health care providers may recommend pelvic exams and Pap testing every 3 years, or they may recommend the Pap and pelvic exam, combined with testing for human papilloma virus (HPV), every 5 years. Some types of HPV increase your risk of cervical cancer. Testing for HPV may also be done on women of any age with unclear Pap test results.  Other health care providers may not recommend any screening for nonpregnant women who are considered low risk for pelvic cancer and who do not have symptoms. Ask your health care provider if a screening pelvic exam is right for you.  If you have had past treatment for cervical cancer or a condition that could lead to cancer, you need Pap tests and screening for cancer for at least 20 years after your treatment. If Pap tests have been discontinued, your risk factors (such as having a new sexual partner) need to be reassessed to determine if screening should resume. Some women have medical problems that increase the chance of getting cervical cancer. In these cases, your health care provider may recommend more frequent screening and Pap tests. Colorectal Cancer  This type of cancer can be detected and often prevented.  Routine colorectal cancer screening usually begins at 51 years of age and continues through 51 years of age.  Your health care provider may recommend screening at an earlier age if you have risk factors for colon cancer.  Your health care provider may also recommend using home test kits to check for hidden blood in the stool.  A small camera at the end of a tube can be used to examine your colon directly  (sigmoidoscopy or colonoscopy). This is done to check for the earliest forms of colorectal cancer.  Routine screening usually begins at age 14.  Direct examination of the colon should be repeated every 5-10 years through 51 years of age. However, you may need to be screened more often if early forms of precancerous polyps or small growths are found. Skin Cancer  Check your skin from head to toe regularly.  Tell your health care provider about any new moles or changes in moles, especially if there is a change in a mole's shape or color.  Also tell your health care provider if you have a mole that is larger than the size of a pencil eraser.  Always use sunscreen. Apply sunscreen liberally and repeatedly throughout the day.  Protect yourself by wearing long sleeves, pants, a wide-brimmed hat, and sunglasses whenever you are outside. HEART DISEASE, DIABETES, AND HIGH BLOOD PRESSURE   High blood pressure causes heart disease and increases the risk of stroke. High blood pressure  is more likely to develop in:  People who have blood pressure in the high end of the normal range (130-139/85-89 mm Hg).  People who are overweight or obese.  People who are African American.  If you are 20-25 years of age, have your blood pressure checked every 3-5 years. If you are 71 years of age or older, have your blood pressure checked every year. You should have your blood pressure measured twice--once when you are at a hospital or clinic, and once when you are not at a hospital or clinic. Record the average of the two measurements. To check your blood pressure when you are not at a hospital or clinic, you can use:  An automated blood pressure machine at a pharmacy.  A home blood pressure monitor.  If you are between 41 years and 9 years old, ask your health care provider if you should take aspirin to prevent strokes.  Have regular diabetes screenings. This involves taking a blood sample to check your  fasting blood sugar level.  If you are at a normal weight and have a low risk for diabetes, have this test once every three years after 51 years of age.  If you are overweight and have a high risk for diabetes, consider being tested at a younger age or more often. PREVENTING INFECTION  Hepatitis B  If you have a higher risk for hepatitis B, you should be screened for this virus. You are considered at high risk for hepatitis B if:  You were born in a country where hepatitis B is common. Ask your health care provider which countries are considered high risk.  Your parents were born in a high-risk country, and you have not been immunized against hepatitis B (hepatitis B vaccine).  You have HIV or AIDS.  You use needles to inject street drugs.  You live with someone who has hepatitis B.  You have had sex with someone who has hepatitis B.  You get hemodialysis treatment.  You take certain medicines for conditions, including cancer, organ transplantation, and autoimmune conditions. Hepatitis C  Blood testing is recommended for:  Everyone born from [birth year] through 1966.  Anyone with known risk factors for hepatitis C. Sexually transmitted infections (STIs)  You should be screened for sexually transmitted infections (STIs) including gonorrhea and chlamydia if:  You are sexually active and are younger than 51 years of age.  You are older than 51 years of age and your health care provider tells you that you are at risk for this type of infection.  Your sexual activity has changed since you were last screened and you are at an increased risk for chlamydia or gonorrhea. Ask your health care provider if you are at risk.  If you do not have HIV, but are at risk, it may be recommended that you take a prescription medicine daily to prevent HIV infection. This is called pre-exposure prophylaxis (PrEP). You are considered at risk if:  You are sexually active and do not regularly use condoms or  know the HIV status of your partner(s).  You take drugs by injection.  You are sexually active with a partner who has HIV. Talk with your health care provider about whether you are at high risk of being infected with HIV. If you choose to begin PrEP, you should first be tested for HIV. You should then be tested every 3 months for as long as you are taking PrEP.  PREGNANCY   If you are premenopausal and  you may become pregnant, ask your health care provider about preconception counseling.  If you may become pregnant, take 400 to 800 micrograms (mcg) of folic acid every day.  If you want to prevent pregnancy, talk to your health care provider about birth control (contraception). OSTEOPOROSIS AND MENOPAUSE   Osteoporosis is a disease in which the bones lose minerals and strength with aging. This can result in serious bone fractures. Your risk for osteoporosis can be identified using a bone density scan.  If you are 71 years of age or older, or if you are at risk for osteoporosis and fractures, ask your health care provider if you should be screened.  Ask your health care provider whether you should take a calcium or vitamin D supplement to lower your risk for osteoporosis.  Menopause may have certain physical symptoms and risks.  Hormone replacement therapy may reduce some of these symptoms and risks. Talk to your health care provider about whether hormone replacement therapy is right for you.  HOME CARE INSTRUCTIONS   Schedule regular health, dental, and eye exams.  Stay current with your immunizations.   Do not use any tobacco products including cigarettes, chewing tobacco, or electronic cigarettes.  If you are pregnant, do not drink alcohol.  If you are breastfeeding, limit how much and how often you drink alcohol.  Limit alcohol intake to no more than 1 drink per day for nonpregnant women. One drink equals 12 ounces of beer, 5 ounces of wine, or 1 ounces of hard liquor.  Do  not use street drugs.  Do not share needles.  Ask your health care provider for help if you need support or information about quitting drugs.  Tell your health care provider if you often feel depressed.  Tell your health care provider if you have ever been abused or do not feel safe at home.   This information is not intended to replace advice given to you by your health care provider. Make sure you discuss any questions you have with your health care provider.   Document Released: 07/21/2010 Document Revised: 01/26/2014 Document Reviewed: 12/07/2012 [employer] Interactive Patient Education [employer].

## 2015-08-06 NOTE — Progress Notes (Signed)
Patient ID: [name], female   DOB: [birth year], 50 y.o.   MRN: [id number]    PCP: Lauree Chandler, NP  Advanced Directive information Does patient have an advance directive?: No, Would patient like information on creating an advanced directive?: No - patient declined information  Allergies  Allergen Reactions  . Codeine Swelling and Other (See Comments)    Swelling and burning of mouth (inside)  . Lactose Intolerance (Gi) Nausea And Vomiting    Chief Complaint  Patient presents with  . Medical Management of Chronic Issues    Complete physical.      HPI: Patient is a 50 y.o. female seen in the office today for physical exam. No major illness or hospitalization in the last year Screenings: Colon Cancer- following with GI, plans to do cologuard for screening Breast Cancer- due to mammogram, order has been placed  due for PAP  Depression screening Depression screen [facility] 2/9 08/06/2015 01/08/2015 10/09/2014 09/03/2014 12/25/2013  Decreased Interest 0 0 0 2 0  Down, Depressed, Hopeless 0 0 0 3 0  PHQ - 2 Score 0 0 0 5 0  Altered sleeping - - - 2 -  Tired, decreased energy - - - 2 -  Change in appetite - - - 3 -  Feeling bad or failure about yourself  - - - 2 -  Trouble concentrating - - - 2 -  Moving slowly or fidgety/restless - - - 3 -  Suicidal thoughts - - - 0 -  PHQ-9 Score - - - 19 -  no anxiety or depression Falls Fall Risk  08/06/2015 07/29/2015 06/03/2015 05/20/2015 01/08/2015  Falls in the past year? No No No No No  Number falls in past yr: - - - - -  Injury with Fall? - - - - -  Risk for fall due to : - - - - -  Risk for fall due to (comments): - - - - -  Follow up - - - - -   MMSE No flowsheet data found. Vaccines Immunization History  Administered Date(s) Administered  . Influenza Split 10/07/2012  . Influenza Whole 11/20/2006  . Influenza,inj,Quad PF,36+ Mos 10/30/2013, 10/19/2014  . Pneumococcal Conjugate-13 04/29/2015  . Pneumococcal  Polysaccharide-23 11/20/2006, 09/25/2011    Smoking status: previous smoker, quit 28 years ago Alcohol use: 1 glass of wine a week  Dentist: every 6 months Ophthalmologist: last went May, goes yearly  Exercise regimen: daily for 30 mins (has exercise bike) Diet: diabetic diet.   Functional Status of ADLs: independent of all ADLs independent, boyfriend helps with bills and she does not drive Incontinence-of bladder    Was seeing neurologist in [place]- has not been in several years would like to see neurologist in town   Following with [facility] Diabetic eye exam in may 2017, needs foot exam On ACEi  Review of Systems:  Review of Systems  Constitutional: Negative for fever and chills.  HENT: Negative for hearing loss.   Eyes: Positive for visual disturbance.  Respiratory: Negative for cough and shortness of breath.   Cardiovascular: Negative for chest pain and leg swelling.  Gastrointestinal: Negative for abdominal pain, constipation and blood in stool.  Genitourinary: Negative for dysuria and urgency.  Neurological: Negative for dizziness, seizures (hx of seizures, none in many years) and weakness.  Hematological:       Diabetes    Past Medical History  Diagnosis Date  . Hypertension   . Asthma   . Seizures (  [place])   . Heart murmur     born with   . Dysrhythmia     skipped beats  . Myocardial infarction ([place])     hi of x 2  last one 2003   . COPD (chronic obstructive pulmonary disease) ([facility])     chronic bronchitis   . Pneumonia     hx of 2009  . GERD (gastroesophageal reflux disease)   . Headache(784.0)     migraines   . Neuropathy ([place])     due to diabetes   . Dizziness     secondary to diabetes and hypertension   . Arthritis     joint pain   . Hx of blood clots     hematomas removed from left side of brain from 30mo to 89yrold   . [place]    last seizure 2012;controlled w/ topomax  . Diabetic neuropathy,  painful ([place]    FEET AND HANDS  . Diabetes mellitus     since age [age]type 2 IDDM  . Sleep ap[facility]     [facility] ; mild  . MIGRAINE HEADACHE 09/14/2006    Qualifier: Diagnosis of  By: MaHassell DoneNP, NyTori Milks  . Glaucoma     NOT ON ANY EYE DROPS   . Legally blind     left eye   . [facility]    last 2003  RESIDUAL LEFT LEG WEAKNESS--NO OTHER RESIDUAL PROBLEMS  . CVA 11/14/2007    Qualifier: Diagnosis of  By: MaHassell DoneNP, NyTori Milks  . Pain 09/23/11    LOWER BACK AND RIGHT SIDE--PT HAS RIGHT URETERAL STONE  . Nephrolithiasis     frequent urination , urination at nite  PT SEEN IN ER 09/22/11 FOR BACK AND RT SIDED PAIN--HAS KNOWN STONE RT URETER AND UA IN ER SHOWED UTI  . Anxiety   . Depression   . Stress incontinence   . Hyperlipidemia   . Pyelonephritis    Past Surgical History  Procedure Laterality Date  . Kidney stone removal    . Brain hematoma evacuation      five procedures total, first procedure when [age]onths old, last at [age]ears of age.  . Shunt removal      shunt inserted at age [age]emoved at age 50 . Retinal detachment surgery Left 1990  . Vaginal hysterectomy  1996  . Incisional hernia repair  05/05/2011    Procedure: LAPAROSCOPIC INCISIONAL HERNIA;  Surgeon: MaRolm BookbinderMD;  [facility] Service: General;  Laterality: N/A;  . Mass excision  05/05/2011    Procedure: EXCISION MASS;  Surgeon: MaRolm BookbinderMD;  [facility] Service: General;  Laterality: Right;  . Pcnl    . Cystoscopy/retrograde/ureteroscopy  08/24/2011    Procedure: CYSTOSCOPY/RETROGRADE/URETEROSCOPY;  Surgeon: DaMolli HazardMD;  Location: [facility];  Service: Urology;  Laterality: Right;  Cysto, Right retrograde Pyelogram, right stent placement.   . Cystoscopy with ureteroscopy  09/24/2011    Procedure: CYSTOSCOPY WITH URETEROSCOPY;  Surgeon: JoMalka SoMD;  Location: [facility];  Service: Urology;  Laterality: Right;  Balloon dilation right  ureter   . Cystoscopy w/ retrogrades  09/24/2011    Procedure: CYSTOSCOPY WITH RETROGRADE PYELOGRAM;  Surgeon: JoMalka SoMD;  Location: [facility];  Service: Urology;  Laterality: Right;   Social History:   reports that she quit smoking about 28  years ago. Her smoking use included Cigarettes. She has a 30 pack-year smoking history. She has never used smokeless tobacco. She reports that she drinks alcohol. She reports that she does not use illicit drugs.  Family History  Problem Relation Age of Onset  . Uterine cancer Mother   . Hypertension Mother   . Brain cancer Maternal Grandmother   . Hypertension Maternal Grandmother   . Anesthesia problems Neg Hx   . Colon cancer Neg Hx   . Esophageal cancer Neg Hx   . Pancreatic cancer Neg Hx   . Cirrhosis Maternal Grandfather     Medications: Patient's Medications  New Prescriptions   No medications on file  Previous Medications   ALBUTEROL (PROAIR HFA) 108 (90 BASE) MCG/ACT INHALER    INHALE 2 PUFFS BY MOUTH EVERY 6 HOURS AS NEEDED FOR WHEEZING OR SHORTNESS OF BREATH   AMBULATORY NON FORMULARY MEDICATION    Medication Name: Muscle and Joint Vanishing Scent Gel- Apply as needed to skin   BACLOFEN (LIORESAL) 10 MG TABLET    TAKE 1 TABLET BY MOUTH 3 TIMES A DAY AS NEEDED FOR MUSCLE SPASMS   BUDESONIDE-FORMOTEROL (SYMBICORT) 160-4.5 MCG/ACT INHALER    Inhale 2 puffs into the lungs daily.   CALCIUM CARBONATE-VITAMIN D (CALCIUM-VITAMIN D3 PO)    D3 500- calcium 630 mg Take 1 capsule by mouth once daily   CLOPIDOGREL (PLAVIX) 75 MG TABLET    Take 1 tablet (75 mg total) by mouth daily.   CYCLOSPORINE (RESTASIS) 0.05 % OPHTHALMIC EMULSION    Place 2 drops into both eyes 2 (two) times daily.   FLUOROMETHOLONE (FML) 0.1 % OPHTHALMIC SUSPENSION    Place 2 drops into both eyes 2 (two) times daily.   GABAPENTIN (NEURONTIN) 300 MG CAPSULE    Take 2 capsules (600 mg total) by mouth 3 (three) times daily.   GLUCOSE BLOOD (ONETOUCH VERIO) TEST STRIP    1  each by Other route 3 (three) times daily. E11.29   HYDRALAZINE (APRESOLINE) 25 MG TABLET    Take 1 tablet (25 mg total) by mouth 3 (three) times daily.   IBUPROFEN (ADVIL,MOTRIN) 600 MG TABLET    Take 1 tablet (600 mg total) by mouth every 8 (eight) hours as needed. for pain   INSULIN GLARGINE (TOUJEO SOLOSTAR) 300 UNIT/ML SOPN    Inject 86 Units into the skin daily. Reported on 07/18/2015   INSULIN LISPRO (HUMALOG KWIKPEN) 200 UNIT/ML SOPN    Inject 5 Units into the skin 3 (three) times daily with meals.   LISINOPRIL (PRINIVIL,ZESTRIL) 10 MG TABLET    Take 1 tablet (10 mg total) by mouth daily.   MIRABEGRON ER (MYRBETRIQ) 50 MG TB24 TABLET    Take 1 tablet (50 mg total) by mouth daily.   ONDANSETRON (ZOFRAN-ODT) 4 MG DISINTEGRATING TABLET    TAKE 1 TABLET BY MOUTH EVERY 8 HOURS AS NEEDED FOR NAUSEA OR VOMITING   PANTOPRAZOLE (PROTONIX) 40 MG TABLET    Take 1 tablet (40 mg total) by mouth daily.   POLYETHYLENE GLYCOL (MIRALAX / GLYCOLAX) PACKET    Take 17 g by mouth daily as needed for mild constipation.   POTASSIUM CHLORIDE SA (K-DUR,KLOR-CON) 20 MEQ TABLET    Take 2 tablets (40 mEq total) by mouth daily.   RANITIDINE (ZANTAC) 150 MG TABLET    Take 1 tablet (150 mg total) by mouth 2 (two) times daily.   SIMVASTATIN (ZOCOR) 20 MG TABLET    Take one tablet by mouth every morning  for cholesterol   TOPIRAMATE (TOPAMAX) 200 MG TABLET    Take 1 tablet (200 mg total) by mouth 2 (two) times daily.   VERAPAMIL (VERELAN PM) 240 MG 24 HR CAPSULE    TAKE ONE CAPSULE BY MOUTH EVERY NIGHT AT BEDTIME FOR BLOOD PRESSURE  Modified Medications   Modified Medication Previous Medication   FLUTICASONE (FLONASE) 50 MCG/ACT NASAL SPRAY fluticasone (FLONASE) 50 MCG/ACT nasal spray      Place 2 sprays into both nostrils daily.    Place 2 sprays into both nostrils daily.  Discontinued Medications   No medications on file     Physical Exam:  Filed Vitals:   08/06/15 1046  BP: 132/82  Pulse: 81  Temp: 98.3 F  (36.8 C)  TempSrc: Oral  Resp: 17  Height: 5' 6"  (1.676 m)  Weight: 236 lb 6.4 oz (107.23 kg)  SpO2: 98%   Body mass index is 38.17 kg/(m^2).  Physical Exam  Constitutional: She is oriented to person, place, and time. She appears well-developed and well-nourished. No distress.  Obese black female  HENT:  Head: Normocephalic and atraumatic.  Right Ear: External ear normal.  Left Ear: External ear normal.  Nose: Nose normal.  Mouth/Throat: Oropharynx is clear and moist.  Eyes: Conjunctivae and EOM are normal. Pupils are equal, round, and reactive to light.  Proptosis of eyes  Neck: Normal range of motion. Neck supple.  Cardiovascular: Normal rate, regular rhythm, normal heart sounds and intact distal pulses.   Pulmonary/Chest: Effort normal and breath sounds normal. No respiratory distress.  Abdominal: Soft. Bowel sounds are normal.  Genitourinary: Vagina normal.  Musculoskeletal: Normal range of motion.  Neurological: She is alert and oriented to person, place, and time.  Skin: Skin is warm and dry.  Psychiatric: She has a normal mood and affect.    Labs reviewed: Basic Metabolic Panel:  Recent Labs  12/19/14 0915 05/08/15 1106 07/26/15 0949  NA 139 138 137  K 4.0 4.2 4.2  CL 104 102 110  CO2 23 19 19*  GLUCOSE 125* 218* 203*  BUN 17 14 14   CREATININE 0.86 1.10* 1.05  CALCIUM 9.1 9.8 9.1  TSH  --  0.711  --    Liver Function Tests:  Recent Labs  09/04/14 1030 05/08/15 1106 07/26/15 0949  AST 9 9 8*  ALT 7 12 11   ALKPHOS 66 81 62  BILITOT <0.2 0.3 0.3  PROT 7.1 7.7 6.9  ALBUMIN 3.9 4.1 3.8   No results for input(s): LIPASE, AMYLASE in the last 8760 hours. No results for input(s): AMMONIA in the last 8760 hours. CBC:  Recent Labs  09/04/14 1030 11/27/14 1210 11/27/14 1219 05/08/15 1106  WBC 5.6 5.8  --  7.7  NEUTROABS 3.8 3.6  --  5.5  HGB  --  11.5* 12.6  --   HCT 36.5 36.2 37.0 39.5  MCV 88 92.1  --  91  PLT 276 254  --  274   Lipid  Panel:  Recent Labs  05/08/15 1106  CHOL 167  HDL 48  LDLCALC 99  TRIG 98  CHOLHDL 3.5   TSH:  Recent Labs  05/08/15 1106  TSH 0.711   A1C: Lab Results  Component Value Date   HGBA1C 7.6* 07/26/2015     Assessment/Plan 1. Wellness examination Pt has been doing well and has no complaints today -needs to get Mammogram done, given information to call and to set up appt.  -has seen GI and plans to get cologuard  PAP done today Discussed lifestyle modifications in regard to diet and exercise 30 mins/ 5 days a week  2. Special screening for malignant neoplasms, colon -cologuard ordered per GI  3. Seizures ([place]) -no recent seizures, has not followed up with neurology since she has moved here. Previously seen at [facility] -conts on topamax - Ambulatory referral to Neurology  4. Coronary artery disease involving native coronary artery of native heart without angina pectoris -no chest pains, hx of MI, was seen by cardiology in 2015 however did not go back and did not get echo.  - Ambulatory referral to Cardiology  -has follow up scheduled with [facility] in regards to diabetes. Will follow up with pt in 6 months, sooner if needed  Jessica K. Harle Battiest  [facility] [phone] 8 am - 5 pm) [phone] (after hours)

## 2015-08-08 ENCOUNTER — Telehealth: Payer: Self-pay

## 2015-08-08 LAB — PAP IG (IMAGE GUIDED)

## 2015-08-08 NOTE — Telephone Encounter (Signed)
I called patient to confirm that appointments had been set up with cardiology and neurology. These referrals were placed during patient's OV on 08/06/15.   Patient stated that Cardiology appointment is September 12, 2015 and Neurology appointment is September 25, 2015.   Patient has not scheduled mammogram that was order yet but she will.

## 2015-08-13 ENCOUNTER — Ambulatory Visit
Admission: RE | Admit: 2015-08-13 | Discharge: 2015-08-13 | Disposition: A | Discharge status: Home / Self Care | Payer: Medicare Other | Source: Ambulatory Visit | Attending: Nurse Practitioner | Admitting: Nurse Practitioner

## 2015-08-13 DIAGNOSIS — Z1231 Encounter for screening mammogram for malignant neoplasm of breast: Secondary | ICD-10-CM

## 2015-08-14 ENCOUNTER — Telehealth: Payer: Self-pay | Admitting: *Deleted

## 2015-08-14 NOTE — Telephone Encounter (Signed)
temporary

## 2015-08-14 NOTE — Telephone Encounter (Signed)
Patient called and stated that you gave her a Handicap Placard but you marked both Temporary and Permanent and the DMV only gave her a Temporary Placard and told her that she would need to get another filled out with just Permanent marked. Is it suppose to be Permanent or Temporary? Please Advise.

## 2015-08-15 NOTE — Telephone Encounter (Signed)
Patient notified and stated that she will need another one in January.

## 2015-08-29 ENCOUNTER — Encounter: Payer: Self-pay | Admitting: Cardiology

## 2015-09-11 NOTE — Progress Notes (Signed)
Cardiology Office Note    Date:  09/25/2015   ID:  ISLAH EVE, DOB 1965/09/23, MRN [id number]  PCP:  Lauree Chandler, NP  Cardiologist:  Fransico Him, MD   No chief complaint on file.   History of Present Illness:  [name] is a 49 y.o. female with a history of HTN, heart murmur at birth.  She was evaluated by Cardiology in 2013 for post op tachycardia that was felt related to physiologic stress with ileus and abdominal pain.  She carries a diagnosis of MI x2 remotely with last being in 2003 in setting of her CVA but I do not have any documentation of this and she never had a cardiac workup done. This was apparently at [facility].  She also has a history of DM and COPD presents today for cardiac followup.  She denies any chest pain.  She has chronic SOB due to her asthma.  She denies any palpitations, LE edema, dizziness or syncope.   Past Medical History:  Diagnosis Date  . Anxiety   . Arthritis    joint pain   . Asthma   . COPD (chronic obstructive pulmonary disease) (HCC)    chronic bronchitis   . CVA 11/14/2007   Qualifier: Diagnosis of  By: Hassell Done [name], Tori Milks    . Depression   . Diabetes mellitus    since age 86; type 2 IDDM  . Diabetic neuropathy, painful ([place])    FEET AND HANDS  . Dizziness    secondary to diabetes and hypertension   . Dysrhythmia    skipped beats  . Epilepsy idiopathic petit mal ([place])    last seizure 2012;controlled w/ topomax  . GERD (gastroesophageal reflux disease)   . Glaucoma    NOT ON ANY EYE DROPS   . Headache(784.0)    migraines   . Heart murmur    born with   . Hx of blood clots    hematomas removed from left side of brain from 3mo to 89yrold   . Hyperlipidemia   . Hypertension   . Legally blind    left eye   . MIGRAINE HEADACHE 09/14/2006   Qualifier: Diagnosis of  By: MaHassell DoneNP, NyTori Milks  . [place] ([place]   hi of x 2  last one 2003   . Nephrolithiasis    frequent urination , urination at nite   PT SEEN IN ER 09/22/11 FOR BACK AND RT SIDED PAIN--HAS KNOWN STONE RT URETER AND UA IN ER SHOWED UTI  . Neuropathy ([place]   due to diabetes   . Pain 09/23/11   LOWER BACK AND RIGHT SIDE--PT HAS RIGHT URETERAL STONE  . Pneumonia    hx of 2009  . Pyelonephritis   . Seizures ([place]  . Sleep ap[facility]    [facility] not [facility] ; mild  . Stress incontinence   . [facility]   last 2003  RESIDUAL LEFT LEG WEAKNESS--NO OTHER RESIDUAL PROBLEMS    Past Surgical History:  Procedure Laterality Date  . BRAIN HEMATOMA EVACUATION     five procedures total, first procedure when 1153 monthsld, last at [age]ears of age.  . CYSTOSCOPY W/ RETROGRADES  09/24/2011   Procedure: CYSTOSCOPY WITH RETROGRADE PYELOGRAM;  Surgeon: JoMalka SoMD;  Location: [facility];  Service: Urology;  Laterality: Right;  . CYSTOSCOPY WITH URETEROSCOPY  09/24/2011   Procedure: CYSTOSCOPY WITH URETEROSCOPY;  Surgeon: JoMalka So  MD;  Location: [facility];  Service: Urology;  Laterality: Right;  Balloon dilation right ureter   . CYSTOSCOPY/RETROGRADE/URETEROSCOPY  08/24/2011   Procedure: CYSTOSCOPY/RETROGRADE/URETEROSCOPY;  Surgeon: Molli Hazard, MD;  Location: [facility];  Service: Urology;  Laterality: Right;  Cysto, Right retrograde Pyelogram, right stent placement.   Fatima Blank HERNIA REPAIR  05/05/2011   Procedure: LAPAROSCOPIC INCISIONAL HERNIA;  Surgeon: Rolm Bookbinder, MD;  Location: [place];  Service: General;  Laterality: N/A;  . kidney stone removal    . MASS EXCISION  05/05/2011   Procedure: EXCISION MASS;  Surgeon: Rolm Bookbinder, MD;  Location: [place];  Service: General;  Laterality: Right;  . PCNL    . RETINAL DETACHMENT SURGERY Left 1990  . SHUNT REMOVAL     shunt inserted at age 29 removed at age 25   . VAGINAL HYSTERECTOMY  1996    Current Medications: Outpatient Medications Prior to Visit  Medication Sig Dispense Refill  . albuterol (PROAIR HFA) 108 (90 Base) MCG/ACT inhaler INHALE 2  PUFFS BY MOUTH EVERY 6 HOURS AS NEEDED FOR WHEEZING OR SHORTNESS OF BREATH 3 Inhaler 3  . AMBULATORY NON FORMULARY MEDICATION Medication Name: Muscle and Joint Vanishing Scent Gel- Apply as needed to skin    . baclofen (LIORESAL) 10 MG tablet TAKE 1 TABLET BY MOUTH 3 TIMES A DAY AS NEEDED FOR MUSCLE SPASMS 90 tablet 4  . budesonide-formoterol (SYMBICORT) 160-4.5 MCG/ACT inhaler Inhale 2 puffs into the lungs daily. 3 Inhaler 3  . Calcium Carbonate-Vitamin D (CALCIUM-VITAMIN D3 PO) D3 500- calcium 630 mg Take 1 capsule by mouth once daily    . clopidogrel (PLAVIX) 75 MG tablet Take 1 tablet (75 mg total) by mouth daily. 90 tablet 3  . cycloSPORINE (RESTASIS) 0.05 % ophthalmic emulsion Place 2 drops into both eyes 2 (two) times daily.    . fluorometholone (FML) 0.1 % ophthalmic suspension Place 2 drops into both eyes 2 (two) times daily.    . fluticasone (FLONASE) 50 MCG/ACT nasal spray Place 2 sprays into both nostrils daily. 16 g 3  . gabapentin (NEURONTIN) 300 MG capsule Take 2 capsules (600 mg total) by mouth 3 (three) times daily. 360 capsule 3  . glucose blood (ONETOUCH VERIO) test strip 1 each by Other route 3 (three) times daily. E11.29 300 each 3  . hydrALAZINE (APRESOLINE) 25 MG tablet Take 1 tablet (25 mg total) by mouth 3 (three) times daily. 270 tablet 3  . ibuprofen (ADVIL,MOTRIN) 600 MG tablet Take 1 tablet (600 mg total) by mouth every 8 (eight) hours as needed. for pain 270 tablet 3  . Insulin Glargine (TOUJEO SOLOSTAR) 300 UNIT/ML SOPN Inject 86 Units into the skin daily. Reported on 07/18/2015    . Insulin Lispro (HUMALOG KWIKPEN) 200 UNIT/ML SOPN Inject 5 Units into the skin 3 (three) times daily with meals. 15 pen 3  . lisinopril (PRINIVIL,ZESTRIL) 10 MG tablet Take 1 tablet (10 mg total) by mouth daily. 90 tablet 3  . mirabegron ER (MYRBETRIQ) 50 MG TB24 tablet Take 1 tablet (50 mg total) by mouth daily. 90 tablet 3  . ondansetron (ZOFRAN-ODT) 4 MG disintegrating tablet TAKE 1  TABLET BY MOUTH EVERY 8 HOURS AS NEEDED FOR NAUSEA OR VOMITING 20 tablet 0  . pantoprazole (PROTONIX) 40 MG tablet Take 1 tablet (40 mg total) by mouth daily. 90 tablet 3  . polyethylene glycol (MIRALAX / GLYCOLAX) packet Take 17 g by mouth daily as needed for mild constipation. 100 each 3  . potassium  chloride SA (K-DUR,KLOR-CON) 20 MEQ tablet Take 2 tablets (40 mEq total) by mouth daily. 180 tablet 1  . ranitidine (ZANTAC) 150 MG tablet Take 1 tablet (150 mg total) by mouth 2 (two) times daily. 180 tablet 3  . simvastatin (ZOCOR) 20 MG tablet Take one tablet by mouth every morning for cholesterol 90 tablet 3  . topiramate (TOPAMAX) 200 MG tablet Take 1 tablet (200 mg total) by mouth 2 (two) times daily. 180 tablet 3  . verapamil (VERELAN PM) 240 MG 24 hr capsule TAKE ONE CAPSULE BY MOUTH EVERY NIGHT AT BEDTIME FOR BLOOD PRESSURE 90 capsule 3   No facility-administered medications prior to visit.      Allergies:   Codeine and Lactose intolerance (gi)   Social History   Social History  . Marital status: Widowed    Spouse name: N/A  . Number of children: 2  . Years of education: N/A   Social History Main Topics  . Smoking status: Former Smoker    Packs/day: 1.00    Years: 30.00    Types: Cigarettes    Quit date: 01/20/1987  . Smokeless tobacco: Never Used     Comment: quit more than 20years  . Alcohol use 0.0 oz/week     Comment: 1 glass weekly  . Drug use: No     Comment: hx of marijuana use no longer uses   . Sexual activity: Not Currently    Birth control/ protection: Surgical   Other Topics Concern  . Not on file   Social History Narrative  . No narrative on file     Family History:  The patient's family history includes Brain cancer in her maternal grandmother; Cirrhosis in her maternal grandfather; Hypertension in her maternal grandmother and mother; Uterine cancer in her mother.   ROS:   Please see the history of present illness.    Review of Systems  Respiratory:  Positive for cough.   Musculoskeletal: Positive for back pain.   All other systems reviewed and are negative.   PHYSICAL EXAM:   VS:  BP 118/72   Pulse 85   Ht 5' 6"  (1.676 m)   Wt 241 lb (109.3 kg)   BMI 38.90 kg/m    GEN: Well nourished, well developed, in no acute distress  HEENT: normal  Neck: no JVD, carotid bruits, or masses Cardiac: RRR; no murmurs, rubs, or gallops,no edema.  Intact distal pulses bilaterally.  Respiratory:  Scattered expiratory wheezes GI: soft, nontender, nondistended, + BS MS: no deformity or atrophy  Skin: warm and dry, no rash Neuro:  Alert and Oriented x 3, Strength and sensation are intact Psych: euthymic mood, full affect  Wt Readings from Last 3 Encounters:  09/12/15 241 lb (109.3 kg)  08/06/15 236 lb 6.4 oz (107.2 kg)  07/29/15 233 lb 6.4 oz (105.9 kg)      Studies/Labs Reviewed:   EKG:  EKG is ordered today.  The ekg ordered today demonstrates NSR with LAE  Recent Labs: 11/27/2014: Hemoglobin 12.6 05/08/2015: Platelets 274; TSH 0.711 07/26/2015: ALT 11; BUN 14; Creat 1.05; Potassium 4.2; Sodium 137   Lipid Panel    Component Value Date/Time   CHOL 167 05/08/2015 1106   TRIG 98 05/08/2015 1106   HDL 48 05/08/2015 1106   CHOLHDL 3.5 05/08/2015 1106   CHOLHDL 4.2 10/07/2012 1745   VLDL 19 10/07/2012 1745   LDLCALC 99 05/08/2015 1106    Additional studies/ records that were reviewed today include:  none  ASSESSMENT:    1. Essential hypertension   2. SOB (shortness of breath)   3. Tachycardia      PLAN:  In order of problems listed above:  1. HTN - BP well controlled on current meds.   2. SOB - this is chronic secondary to underlying asthma and appears stable. When I last saw her 2 years ago she was supposed to have a 2D echo done to assess LVF and diastolic function but this was never done.  I will order that today. Given her CRFs including HTN, DM, hyperlipidemia and remote tobacco use, I will get a Lexiscan myoview to  rule out ischemia.  3. Tachycardia - resolved       Medication Adjustments/Labs and Tests Ordered: Current medicines are reviewed at length with the patient today.  Concerns regarding medicines are outlined above.  Medication changes, Labs and Tests ordered today are listed in the Patient Instructions below.  Patient Instructions  Medication Instructions:  Your physician recommends that you continue on your current medications as directed. Please refer to the Current Medication list given to you today.   Labwork: None  Testing/Procedures: None  Follow-Up: Your physician wants you to follow-up in: 1 year with Dr. Radford Pax. You will receive a reminder letter in the mail two months in advance. If you don't receive a letter, please call our office to schedule the follow-up appointment.   Any Other Special Instructions Will Be Listed Below (If Applicable).     If you need a refill on your cardiac medications before your next appointment, please call your pharmacy.      Signed, Fransico Him, MD  09/25/2015 10:51 PM    [address], [place], [place]  [id number] Phone: [phone]; Fax: [phone]

## 2015-09-12 ENCOUNTER — Ambulatory Visit (INDEPENDENT_AMBULATORY_CARE_PROVIDER_SITE_OTHER): Payer: Medicare Other | Admitting: Cardiology

## 2015-09-12 VITALS — BP 118/72 | HR 85 | Ht 66.0 in | Wt 241.0 lb

## 2015-09-12 DIAGNOSIS — R000 Tachycardia, unspecified: Secondary | ICD-10-CM | POA: Diagnosis not present

## 2015-09-12 DIAGNOSIS — I10 Essential (primary) hypertension: Secondary | ICD-10-CM | POA: Diagnosis not present

## 2015-09-12 DIAGNOSIS — R0602 Shortness of breath: Secondary | ICD-10-CM | POA: Diagnosis not present

## 2015-09-12 NOTE — Patient Instructions (Signed)
Medication Instructions:  Your physician recommends that you continue on your current medications as directed. Please refer to the Current Medication list given to you today.   Labwork: None  Testing/Procedures: None  Follow-Up: Your physician wants you to follow-up in: 1 year with Dr. Turner. You will receive a reminder letter in the mail two months in advance. If you don't receive a letter, please call our office to schedule the follow-up appointment.   Any Other Special Instructions Will Be Listed Below (If Applicable).     If you need a refill on your cardiac medications before your next appointment, please call your pharmacy.   

## 2015-09-25 ENCOUNTER — Ambulatory Visit: Payer: Self-pay | Admitting: Neurology

## 2015-09-25 ENCOUNTER — Telehealth: Payer: Self-pay | Admitting: Cardiology

## 2015-09-25 NOTE — Telephone Encounter (Signed)
Please get a 2D echo to assess LVF due to SOB as well as lexiscan myoivew to rule out ischemia as etiology of SOB as I could not find any workup of her ? MI in the past from [facility]

## 2015-09-26 NOTE — Telephone Encounter (Signed)
Left message to call back  

## 2015-09-27 NOTE — Telephone Encounter (Signed)
Left message to call back  

## 2015-10-03 NOTE — Telephone Encounter (Signed)
Left message to call back  

## 2015-10-15 NOTE — Telephone Encounter (Signed)
Left message to call back.  Letter sent to patient to call back to discuss Dr. Theodosia Blender recommendations.

## 2015-10-30 ENCOUNTER — Telehealth: Payer: Self-pay | Admitting: Cardiology

## 2015-10-30 DIAGNOSIS — R0602 Shortness of breath: Secondary | ICD-10-CM

## 2015-10-30 DIAGNOSIS — I252 Old myocardial infarction: Secondary | ICD-10-CM

## 2015-10-30 NOTE — Telephone Encounter (Signed)
Left message to call back  

## 2015-10-30 NOTE — Telephone Encounter (Signed)
Follow Up:    Pt says she received a letter stating that she need to be scheduled for some test.

## 2015-11-04 ENCOUNTER — Encounter: Payer: Self-pay | Admitting: Nurse Practitioner

## 2015-11-04 ENCOUNTER — Ambulatory Visit: Payer: Medicare Other | Admitting: Pharmacotherapy

## 2015-11-06 LAB — COMPLETE METABOLIC PANEL WITH GFR

## 2015-11-06 LAB — FRUCTOSAMINE

## 2015-11-06 LAB — HEMOGLOBIN A1C

## 2015-11-11 ENCOUNTER — Encounter: Payer: Self-pay | Admitting: Pharmacotherapy

## 2015-11-11 ENCOUNTER — Ambulatory Visit (INDEPENDENT_AMBULATORY_CARE_PROVIDER_SITE_OTHER): Payer: Medicare Other | Admitting: Pharmacotherapy

## 2015-11-11 VITALS — BP 148/86 | HR 88 | Ht 66.0 in | Wt 243.0 lb

## 2015-11-11 DIAGNOSIS — E1140 Type 2 diabetes mellitus with diabetic neuropathy, unspecified: Secondary | ICD-10-CM | POA: Diagnosis not present

## 2015-11-11 DIAGNOSIS — IMO0002 Reserved for concepts with insufficient information to code with codable children: Secondary | ICD-10-CM

## 2015-11-11 DIAGNOSIS — I10 Essential (primary) hypertension: Secondary | ICD-10-CM

## 2015-11-11 DIAGNOSIS — E1165 Type 2 diabetes mellitus with hyperglycemia: Secondary | ICD-10-CM | POA: Diagnosis not present

## 2015-11-11 MED ORDER — LISINOPRIL 20 MG PO TABS
20.0000 mg | ORAL_TABLET | Freq: Every day | ORAL | 3 refills | Status: DC
Start: 2015-11-11 — End: 2015-11-12

## 2015-11-11 MED ORDER — ONETOUCH DELICA LANCETS 33G MISC: 1.0000 | Freq: Three times a day (TID) | 3 refills | Status: AC

## 2015-11-11 NOTE — Telephone Encounter (Signed)
Left message to call back  

## 2015-11-11 NOTE — Patient Instructions (Signed)
Keep up the good work!!  Increase Lisinopril 25m daily. Continue Toujeo 86 units daily Conitnue Humalog 5 units with each meal.  Monitor area on back.  If skin texture changes, or area gets larger, let uKoreaknow.

## 2015-11-11 NOTE — Progress Notes (Signed)
  Subjective:    [name] is a 50 y.o.African American female who presents for follow-up of Type 2 diabetes mellitus.   Her last A1C on 07/26/15 was 7.6%.  She is currently on Toujeo 86 units daily and Humalog 5 units with each meal. Denies missed doses. Forgot to bring meter.  Spending more time in [place]. She self reports home BG 120-130 range. No hypoglycemia.  Trying to eat a healthy diet. Exercise bike and walking for exercise. Wears glasses.  Otherwise, no problems with glasses.  Has eye exam scheduled for November.  Has cataracts. Denies problems with feet.  Checks feet daily. Nocturia 6-7 times per night. No dysuria. Staying well hydrated with water.  She has been checking BP at home.  Reports SBP 140-160 range.  DBP 70-80 range.  Review of Systems A comprehensive review of systems was negative except for: Eyes: positive for cataracts Cardiovascular: positive for lower extremity edema Genitourinary: positive for nocturia Integument/breast: positive for skin color change    Objective:    BP (!) 148/86   Pulse 88   Ht 5' 6"  (1.676 m)   Wt 243 lb (110.2 kg)   BMI 39.22 kg/m   General:  alert, cooperative and no distress  Oropharynx: normal findings: lips normal without lesions and gums healthy   Eyes:  negative findings: lids and lashes normal and conjunctivae and sclerae normal   Ears:  external ears normal        Lung: clear to auscultation bilaterally  Heart:  regular rate and rhythm     Extremities: edema bilateral lower extremities  Skin: small discolored area on mid-back     Neuro: mental status, speech normal, alert and oriented x3 and gait and station normal   Lab Review Glucose (mg/dL)  Date Value  05/08/2015 218 (H)  12/19/2014 125 (H)   Glucose, Bld (mg/dL)  Date Value  07/26/2015 CANCELED  07/26/2015 203 (H)  11/27/2014 96   CO2 (mmol/L)  Date Value  07/26/2015 CANCELED  07/26/2015 19 (L)  05/08/2015 19   BUN (mg/dL)   Date Value  07/26/2015 CANCELED  07/26/2015 14  05/08/2015 14  12/19/2014 17  11/27/2014 20  09/04/2014 13   Creat (mg/dL)  Date Value  07/26/2015 CANCELED  07/26/2015 1.05  10/07/2012 0.89   Creatinine, Ser (mg/dL)  Date Value  05/08/2015 1.10 (H)  12/19/2014 0.86  11/27/2014 1.10 (H)       Assessment:    Diabetes Mellitus type II, under good control.  Home SMBG values are excellent.  Will check A1C today. BP above goal <140/90   Plan:    1.  Rx changes: increase Lisinopril 35m daily.  2.  BP is above target <140/90 in our office and at home.  Continue verapamil 2425mdaily. 3.  Home BG values are good.  Continue Toujeo 86 units daily. 4.  Continue Humalog 5 units with each meal. 5.  Counseled on nutrition goals. 6.  Counseled on foot care 7.  Praised exercise efforts - goal is 30-45 minutes 5 x week. 8.  She will monitor area on back for changes.  Will let usKoreanow if skin texture changes, or area gets larger. 9.  Labs today: A1C, CMP, microalbumin.

## 2015-11-12 ENCOUNTER — Other Ambulatory Visit: Payer: Self-pay

## 2015-11-12 DIAGNOSIS — E876 Hypokalemia: Secondary | ICD-10-CM

## 2015-11-12 DIAGNOSIS — N3281 Overactive bladder: Secondary | ICD-10-CM

## 2015-11-12 DIAGNOSIS — I10 Essential (primary) hypertension: Secondary | ICD-10-CM

## 2015-11-12 LAB — HEMOGLOBIN A1C
Hgb A1c MFr Bld: 7.5 % — ABNORMAL HIGH (ref <5.7)
Mean Plasma Glucose: 169 mg/dL

## 2015-11-12 LAB — COMPLETE METABOLIC PANEL WITH GFR
ALT: 9 U/L (ref 6–29)
AST: 8 U/L — ABNORMAL LOW (ref 10–35)
Albumin: 3.6 g/dL (ref 3.6–5.1)
Alkaline Phosphatase: 62 U/L (ref 33–130)
BUN: 13 mg/dL (ref 7–25)
CO2: 22 mmol/L (ref 20–31)
Calcium: 9.1 mg/dL (ref 8.6–10.4)
Chloride: 109 mmol/L (ref 98–110)
Creat: 1.03 mg/dL (ref 0.50–1.05)
GFR, Est African American: 73 mL/min (ref >=60)
GFR, Est Non African American: 63 mL/min (ref >=60)
Glucose, Bld: 252 mg/dL — ABNORMAL HIGH (ref 65–99)
Potassium: 4.2 mmol/L (ref 3.5–5.3)
Sodium: 139 mmol/L (ref 135–146)
Total Bilirubin: 0.2 mg/dL (ref 0.2–1.2)
Total Protein: 6.6 g/dL (ref 6.1–8.1)

## 2015-11-12 LAB — MICROALBUMIN / CREATININE URINE RATIO
Creatinine, Urine: 107 mg/dL (ref 20–320)
Microalb Creat Ratio: 9 mcg/mg creat (ref <30)
Microalb, Ur: 1 mg/dL

## 2015-11-12 MED ORDER — SIMVASTATIN 20 MG PO TABS: ORAL_TABLET | ORAL | 1 refills | Status: DC

## 2015-11-12 MED ORDER — CLOPIDOGREL BISULFATE 75 MG PO TABS: 75.0000 mg | ORAL_TABLET | Freq: Every day | ORAL | 3 refills | Status: DC

## 2015-11-12 MED ORDER — FLUTICASONE PROPIONATE 50 MCG/ACT NA SUSP: 2.0000 | Freq: Every day | NASAL | 3 refills | Status: DC

## 2015-11-12 MED ORDER — BACLOFEN 10 MG PO TABS: ORAL_TABLET | ORAL | 1 refills | Status: DC

## 2015-11-12 MED ORDER — MIRABEGRON ER 50 MG PO TB24: 50.0000 mg | ORAL_TABLET | Freq: Every day | ORAL | 3 refills | Status: DC

## 2015-11-12 MED ORDER — POTASSIUM CHLORIDE CRYS ER 20 MEQ PO TBCR: 40.0000 meq | EXTENDED_RELEASE_TABLET | Freq: Every day | ORAL | 1 refills | Status: DC

## 2015-11-12 MED ORDER — PANTOPRAZOLE SODIUM 40 MG PO TBEC: 40.0000 mg | DELAYED_RELEASE_TABLET | Freq: Every day | ORAL | 3 refills | Status: DC

## 2015-11-12 MED ORDER — RANITIDINE HCL 150 MG PO TABS: 150.0000 mg | ORAL_TABLET | Freq: Two times a day (BID) | ORAL | 3 refills | Status: DC

## 2015-11-12 MED ORDER — LISINOPRIL 20 MG PO TABS: 20.0000 mg | ORAL_TABLET | Freq: Every day | ORAL | 3 refills | Status: DC

## 2015-11-12 MED ORDER — TOPIRAMATE 200 MG PO TABS: 200.0000 mg | ORAL_TABLET | Freq: Two times a day (BID) | ORAL | 3 refills | Status: DC

## 2015-11-12 MED ORDER — GABAPENTIN 300 MG PO CAPS: 600.0000 mg | ORAL_CAPSULE | Freq: Three times a day (TID) | ORAL | 3 refills | Status: DC

## 2015-11-12 MED ORDER — BUDESONIDE-FORMOTEROL FUMARATE 160-4.5 MCG/ACT IN AERO: 2.0000 | INHALATION_SPRAY | Freq: Every day | RESPIRATORY_TRACT | 3 refills | Status: DC

## 2015-11-12 MED ORDER — ALBUTEROL SULFATE HFA 108 (90 BASE) MCG/ACT IN AERS: INHALATION_SPRAY | RESPIRATORY_TRACT | 3 refills | Status: DC

## 2015-11-12 MED ORDER — VERAPAMIL HCL ER 240 MG PO CP24: ORAL_CAPSULE | ORAL | 3 refills | Status: DC

## 2015-11-12 MED ORDER — HYDRALAZINE HCL 25 MG PO TABS
25.0000 mg | ORAL_TABLET | Freq: Three times a day (TID) | ORAL | 3 refills | Status: DC
Start: 2015-11-12 — End: 2016-07-30

## 2015-11-15 ENCOUNTER — Telehealth: Payer: Self-pay

## 2015-11-15 NOTE — Telephone Encounter (Signed)
cologuard was ordered for the pt at her office visit 07-18-2015. Kit was delivered on 07-23-2015. Pt returned kit. Sample could not be processed and needs to be re ordered. Pt has not completed the sample. Order has been cancelled by cologuard.

## 2015-11-17 NOTE — Telephone Encounter (Signed)
The patient appears to be uninterested in having the test performed. Please inform referring physician.  If patient changes her mind, they can order a cologuard and refer back to [name] if positive.

## 2015-11-21 ENCOUNTER — Other Ambulatory Visit: Payer: Self-pay

## 2015-11-21 MED ORDER — INSULIN GLARGINE 300 UNIT/ML ~~LOC~~ SOPN: 86.0000 [IU] | PEN_INJECTOR | Freq: Every day | SUBCUTANEOUS | 1 refills | Status: DC

## 2015-11-21 NOTE — Telephone Encounter (Signed)
Karen Margarita, MD   10:53 PM  Note    Please get a 2D echo to assess LVF due to SOB as well as lexiscan myoivew to rule out ischemia as etiology of SOB as I could not find any workup of her ? MI in the past from [facility]       Patient agrees to ECHO and 2 day lexiscan and tests ordered for scheduling. She understands to fast for two hours prior to stress test, to hold her hydralazine, to hold her diabetic medications unless she can eat, and to wear closed-toed shoes. She was grateful for call.

## 2015-11-21 NOTE — Telephone Encounter (Addendum)
ECHO and stress test have been scheduled 11/21 and 11/22.

## 2015-11-21 NOTE — Telephone Encounter (Signed)
Message left on triage voicemail: Patient needs refill on Tojeo.  I called patient and left message for her to clarify which pharmacy for she has 2 on file [facility] and [facility]

## 2015-11-21 NOTE — Telephone Encounter (Signed)
Patient called back, rx should be sent to [facility]

## 2015-11-27 ENCOUNTER — Other Ambulatory Visit: Payer: Self-pay | Admitting: *Deleted

## 2015-11-27 MED ORDER — IBUPROFEN 600 MG PO TABS: 600.0000 mg | ORAL_TABLET | Freq: Three times a day (TID) | ORAL | 3 refills | Status: DC | PRN

## 2015-11-27 NOTE — Telephone Encounter (Signed)
Patient requested Rx to be sent to [facility].

## 2015-12-05 ENCOUNTER — Telehealth (HOSPITAL_COMMUNITY): Payer: Self-pay | Admitting: *Deleted

## 2015-12-05 NOTE — Telephone Encounter (Signed)
Left message on voicemail per DPR in reference to upcoming appointment scheduled on 12/10/15 with detailed instructions given per Myocardial Perfusion Study Information Sheet for the test. LM to arrive 15 minutes early, and that it is imperative to arrive on time for appointment to keep from having the test rescheduled. If you need to cancel or reschedule your appointment, please call the office within 24 hours of your appointment. Failure to do so may result in a cancellation of your appointment, and a $50 no show fee. Phone number given for call back for any questions. Kirstie Peri

## 2015-12-10 ENCOUNTER — Ambulatory Visit (HOSPITAL_COMMUNITY): Payer: Medicare Other

## 2015-12-11 ENCOUNTER — Other Ambulatory Visit: Payer: Self-pay

## 2015-12-11 ENCOUNTER — Ambulatory Visit (HOSPITAL_COMMUNITY): Payer: Medicare Other | Attending: Cardiology

## 2015-12-11 ENCOUNTER — Ambulatory Visit (HOSPITAL_COMMUNITY): Payer: Medicare Other

## 2015-12-11 DIAGNOSIS — I5189 Other ill-defined heart diseases: Secondary | ICD-10-CM | POA: Insufficient documentation

## 2015-12-11 DIAGNOSIS — I252 Old myocardial infarction: Secondary | ICD-10-CM | POA: Insufficient documentation

## 2015-12-11 DIAGNOSIS — R0602 Shortness of breath: Secondary | ICD-10-CM | POA: Insufficient documentation

## 2015-12-11 DIAGNOSIS — I77819 Aortic ectasia, unspecified site: Secondary | ICD-10-CM | POA: Insufficient documentation

## 2015-12-11 DIAGNOSIS — I351 Nonrheumatic aortic (valve) insufficiency: Secondary | ICD-10-CM | POA: Insufficient documentation

## 2015-12-14 ENCOUNTER — Encounter: Payer: Self-pay | Admitting: Cardiology

## 2015-12-14 DIAGNOSIS — I77810 Thoracic aortic ectasia: Secondary | ICD-10-CM | POA: Insufficient documentation

## 2015-12-16 ENCOUNTER — Ambulatory Visit (HOSPITAL_COMMUNITY): Payer: Medicare Other | Attending: Cardiovascular Disease

## 2015-12-16 DIAGNOSIS — R0602 Shortness of breath: Secondary | ICD-10-CM | POA: Diagnosis not present

## 2015-12-16 DIAGNOSIS — R9439 Abnormal result of other cardiovascular function study: Secondary | ICD-10-CM | POA: Diagnosis not present

## 2015-12-16 DIAGNOSIS — I252 Old myocardial infarction: Secondary | ICD-10-CM | POA: Diagnosis present

## 2015-12-16 MED ORDER — TECHNETIUM TC 99M TETROFOSMIN IV KIT
31.3000 | PACK | Freq: Once | INTRAVENOUS | Status: AC | PRN
  Administered 2015-12-16: 31.3 via INTRAVENOUS
  Filled 2015-12-16: qty 32

## 2015-12-16 MED ORDER — REGADENOSON 0.4 MG/5ML IV SOLN
0.4000 mg | Freq: Once | INTRAVENOUS | Status: AC
  Administered 2015-12-16: 0.4 mg via INTRAVENOUS

## 2015-12-17 ENCOUNTER — Ambulatory Visit (HOSPITAL_COMMUNITY): Payer: Medicare Other | Attending: Cardiology

## 2015-12-17 LAB — MYOCARDIAL PERFUSION IMAGING
LV dias vol: 80 mL (ref 46–106)
LV sys vol: 37 mL
Peak HR: 107 {beats}/min
RATE: 0.26
Rest HR: 74 {beats}/min
SDS: 10
SRS: 2
SSS: 12
TID: 0.88

## 2015-12-17 MED ORDER — TECHNETIUM TC 99M TETROFOSMIN IV KIT
32.1000 | PACK | Freq: Once | INTRAVENOUS | Status: AC | PRN
  Administered 2015-12-17: 32.1 via INTRAVENOUS
  Filled 2015-12-17: qty 33

## 2015-12-22 ENCOUNTER — Emergency Department (HOSPITAL_COMMUNITY)
Admission: EM | Admit: 2015-12-22 | Discharge: 2015-12-23 | Disposition: A | Discharge status: Home / Self Care | Payer: Medicare Other | Attending: Emergency Medicine | Admitting: Emergency Medicine

## 2015-12-22 ENCOUNTER — Encounter (HOSPITAL_COMMUNITY): Payer: Self-pay | Admitting: Nurse Practitioner

## 2015-12-22 DIAGNOSIS — N2889 Other specified disorders of kidney and ureter: Secondary | ICD-10-CM | POA: Diagnosis not present

## 2015-12-22 DIAGNOSIS — I10 Essential (primary) hypertension: Secondary | ICD-10-CM | POA: Diagnosis not present

## 2015-12-22 DIAGNOSIS — E1140 Type 2 diabetes mellitus with diabetic neuropathy, unspecified: Secondary | ICD-10-CM | POA: Diagnosis not present

## 2015-12-22 DIAGNOSIS — I252 Old myocardial infarction: Secondary | ICD-10-CM | POA: Insufficient documentation

## 2015-12-22 DIAGNOSIS — J449 Chronic obstructive pulmonary disease, unspecified: Secondary | ICD-10-CM | POA: Insufficient documentation

## 2015-12-22 DIAGNOSIS — Z79899 Other long term (current) drug therapy: Secondary | ICD-10-CM | POA: Diagnosis not present

## 2015-12-22 DIAGNOSIS — Z87891 Personal history of nicotine dependence: Secondary | ICD-10-CM | POA: Diagnosis not present

## 2015-12-22 DIAGNOSIS — Z8673 Personal history of transient ischemic attack (TIA), and cerebral infarction without residual deficits: Secondary | ICD-10-CM | POA: Diagnosis not present

## 2015-12-22 DIAGNOSIS — Z794 Long term (current) use of insulin: Secondary | ICD-10-CM | POA: Diagnosis not present

## 2015-12-22 DIAGNOSIS — R52 Pain, unspecified: Secondary | ICD-10-CM

## 2015-12-22 DIAGNOSIS — R109 Unspecified abdominal pain: Secondary | ICD-10-CM | POA: Diagnosis present

## 2015-12-22 LAB — I-STAT CHEM 8, ED
BUN: 14 mg/dL (ref 6–20)
Calcium, Ion: 1.22 mmol/L (ref 1.15–1.40)
Chloride: 101 mmol/L (ref 101–111)
Creatinine, Ser: 0.9 mg/dL (ref 0.44–1.00)
Glucose, Bld: 311 mg/dL — ABNORMAL HIGH (ref 65–99)
HCT: 39 % (ref 36.0–46.0)
Hemoglobin: 13.3 g/dL (ref 12.0–15.0)
Potassium: 3.6 mmol/L (ref 3.5–5.1)
Sodium: 139 mmol/L (ref 135–145)
TCO2: 24 mmol/L (ref 0–100)

## 2015-12-22 LAB — URINALYSIS, ROUTINE W REFLEX MICROSCOPIC
Bilirubin Urine: NEGATIVE
Glucose, UA: >1000 mg/dL — AB
Ketones, ur: NEGATIVE mg/dL
Nitrite: NEGATIVE
Protein, ur: 100 mg/dL — AB
Specific Gravity, Urine: 1.017 (ref 1.005–1.030)
pH: 7 (ref 5.0–8.0)

## 2015-12-22 LAB — WET PREP, GENITAL
Clue Cells Wet Prep HPF POC: NONE SEEN
Sperm: NONE SEEN
Trich, Wet Prep: NONE SEEN
Yeast Wet Prep HPF POC: NONE SEEN

## 2015-12-22 LAB — URINE MICROSCOPIC-ADD ON

## 2015-12-22 MED ORDER — KETOROLAC TROMETHAMINE 60 MG/2ML IM SOLN
60.0000 mg | Freq: Once | INTRAMUSCULAR | Status: AC
  Administered 2015-12-23: 60 mg via INTRAMUSCULAR
  Filled 2015-12-22: qty 2

## 2015-12-22 NOTE — ED Triage Notes (Signed)
Patient states she began having some right side flank pain that radiates around to back area. Pain is intermittent and a sharp throbbing. Tried to do a heating pad at home with ibuprofen 682m at home with no relief. Pain is worsening. States the vaginal bleeding started today after she went to the bathroom. Denies any vaginal pain, urinary urgency, burning, odor, fever, diarrhea, nausea, or vomiting. States she had a similar episode about 2 years ago and she ended up having to have a urinary stent placed on right side. Does have a urologist but hasn't seem him in a long time because she hasn't had a need to see him.

## 2015-12-22 NOTE — ED Notes (Signed)
MD said that she d/c istat beta hcg.

## 2015-12-22 NOTE — ED Provider Notes (Signed)
[facility] DEPT Provider Note   CSN: [id number] Arrival date & time: 12/22/15  2233 By signing my name below, I, [name], attest that this documentation has been prepared under the direction and in the presence of Tiara Bartoli, MD. Electronically Signed: Georgette Calderon, ED Scribe. 12/22/15. 11:37 PM.  History   Chief Complaint Chief Complaint  Patient presents with  . Flank Pain  . Vaginal Bleeding   HPI Comments: [name] is a 50 y.o. female with h/o CVA, DM, MI, HTN, and epilepsy who presents to the Emergency Department complaining of intermittent, sharp, throbbing, 7/10 right flank pain radiating to her upper back onset today with associated vaginal bleeding. Pt has tried a heating pad at home and Ibuprofen 672m with no relief to her symptoms. She reports she's had similar symptoms two years ago and had to have a urinary stent placed on the right side. Pt states she has a urologist but hasn't seen them in 4 years. Pt does not regularly have periods at this time as has had a hysterectomy. Pt denies fever, chills, vaginal pain, dysuria, urinary urgency, frequency, constipation, diarrhea, nausea, vomiting, or any other associated symptoms.    The history is provided by the patient. No language interpreter was used.  Flank Pain  This is a new problem. The problem occurs hourly. The problem has been gradually worsening. Pertinent negatives include no chest pain. Nothing relieves the symptoms. She has tried a warm compress for the symptoms. The treatment provided no relief.    Past Medical History:  Diagnosis Date  . Anxiety   . Arthritis    joint pain   . Asthma   . COPD (chronic obstructive pulmonary disease) (HCC)    chronic bronchitis   . CVA 11/14/2007   Qualifier: Diagnosis of  By: MHassell DoneFNP, NTori Milks   . Depression   . Diabetes mellitus    since age 50 type 2 IDDM  . Diabetic neuropathy, painful ([place]    FEET AND HANDS  . Dilated aortic root ([place]    361mby  echo 11/2015  . Dizziness    secondary to diabetes and hypertension   . Dysrhythmia    skipped beats  . Epilepsy idiopathic petit mal ([place]   last seizure 2012;controlled w/ topomax  . GERD (gastroesophageal reflux disease)   . Glaucoma    NOT ON ANY EYE DROPS   . Headache(784.0)    migraines   . Heart murmur    born with   . Hx of blood clots    hematomas removed from left side of brain from [id number]   . Hyperlipidemia   . Hypertension   . Legally blind    left eye   . MIGRAINE HEADACHE 09/14/2006   Qualifier: Diagnosis of  By: MartHassell Done, NykeTori Milks. Myocardial infarction    hi of x 2  last one 2003   . Nephrolithiasis    frequent urination , urination at nite  PT SEEN IN ER 09/22/11 FOR BACK AND RT SIDED PAIN--HAS KNOWN STONE RT URETER AND UA IN ER SHOWED UTI  . Neuropathy (HCC)[place] due to diabetes   . Pain 09/23/11   LOWER BACK AND RIGHT SIDE--PT HAS RIGHT URETERAL STONE  . Pneumonia    hx of 2009  . Pyelonephritis   . Seizures (HCC)[place]. Sleep apnea    [facility] ; mild  . Stress  incontinence   . Stroke [facility])    last 2003  RESIDUAL LEFT LEG WEAKNESS--NO OTHER RESIDUAL PROBLEMS    Patient Active Problem List   Diagnosis Date Noted  . Dilated aortic root ([place])   . Sacroiliac dysfunction 10/09/2014  . Chronic low back pain 09/03/2014  . CVA, old, hemiparesis ([place]) 09/03/2014  . OSA (obstructive sleep apnea) 05/15/2014  . Restrictive airway disease 05/15/2014  . Colitis 02/22/2014  . Type 2 diabetes, uncontrolled, with renal manifestation ([place]) 01/21/2014  . Essential hypertension, benign 01/21/2014  . Chronic pain disorder 01/21/2014  . Migraine 09/07/2013  . DM type 2, uncontrolled, with neuropathy ([place]) 05/23/2013  . Mixed urge and stress incontinence 08/25/2011  . Ureteral stricture, right 08/25/2011  . Pyelonephritis 08/22/2011  . Nephrolithiasis 08/22/2011  . Incisional hernia 05/10/2011  . Obesity, Class  III, BMI 40-49.9 (morbid obesity) ([place]) 05/10/2011  . Tachycardia 05/08/2011  . DIABETES MELLITUS, TYPE II 09/30/2006  . BLINDNESS, [place], [place] DEFINITION 09/14/2006  . HYPERLIPIDEMIA 09/13/2006  . Anxiety and depression 09/13/2006  . Essential hypertension 09/13/2006  . MYOCARDIAL INFARCTION, HX OF 09/13/2006  . GERD 09/13/2006  . SEIZURE DISORDER 09/13/2006    Past Surgical History:  Procedure Laterality Date  . BRAIN HEMATOMA EVACUATION     five procedures total, first procedure when 11 months old, last at 50 years of age.  . CYSTOSCOPY W/ RETROGRADES  09/24/2011   Procedure: CYSTOSCOPY WITH RETROGRADE PYELOGRAM;  Surgeon: Malka So, MD;  Location: [facility];  Service: Urology;  Laterality: Right;  . CYSTOSCOPY WITH URETEROSCOPY  09/24/2011   Procedure: CYSTOSCOPY WITH URETEROSCOPY;  Surgeon: Malka So, MD;  Location: [facility];  Service: Urology;  Laterality: Right;  Balloon dilation right ureter   . CYSTOSCOPY/RETROGRADE/URETEROSCOPY  08/24/2011   Procedure: CYSTOSCOPY/RETROGRADE/URETEROSCOPY;  Surgeon: Molli Hazard, MD;  Location: [facility];  Service: Urology;  Laterality: Right;  Cysto, Right retrograde Pyelogram, right stent placement.   Fatima Blank HERNIA REPAIR  05/05/2011   Procedure: LAPAROSCOPIC INCISIONAL HERNIA;  Surgeon: Rolm Bookbinder, MD;  Location: [place];  Service: General;  Laterality: N/A;  . kidney stone removal    . MASS EXCISION  05/05/2011   Procedure: EXCISION MASS;  Surgeon: Rolm Bookbinder, MD;  Location: [place];  Service: General;  Laterality: Right;  . PCNL    . RETINAL DETACHMENT SURGERY Left 1990  . SHUNT REMOVAL     shunt inserted at age 45 removed at age 1   . VAGINAL HYSTERECTOMY  1996    OB History    Gravida Para Term Preterm AB Living   3 2 2   1 2    SAB TAB Ectopic Multiple Live Births   1       2       Home Medications    Prior to Admission medications   Medication Sig Start Date End Date Taking? Authorizing Provider  albuterol  (PROAIR HFA) 108 (90 Base) MCG/ACT inhaler INHALE 2 PUFFS BY MOUTH EVERY 6 HOURS AS NEEDED FOR WHEEZING OR SHORTNESS OF BREATH 11/12/15   Lauree Chandler, NP  AMBULATORY NON FORMULARY MEDICATION Medication Name: Muscle and Joint Vanishing Scent Gel- Apply as needed to skin    Historical Provider, MD  baclofen (LIORESAL) 10 MG tablet TAKE 1 TABLET BY MOUTH 3 TIMES A DAY AS NEEDED FOR MUSCLE SPASMS 11/12/15   Lauree Chandler, NP  budesonide-formoterol (SYMBICORT) 160-4.5 MCG/ACT inhaler Inhale 2 puffs into the lungs daily. 11/12/15   Lauree Chandler, NP  Calcium Carbonate-Vitamin D (CALCIUM-VITAMIN D3 PO) D3 500- calcium 630 mg Take 1 capsule by mouth once daily    Historical Provider, MD  clopidogrel (PLAVIX) 75 MG tablet Take 1 tablet (75 mg total) by mouth daily. 11/12/15   Lauree Chandler, NP  cycloSPORINE (RESTASIS) 0.05 % ophthalmic emulsion Place 2 drops into both eyes 2 (two) times daily.    Historical Provider, MD  fluorometholone (FML) 0.1 % ophthalmic suspension Place 2 drops into both eyes 2 (two) times daily.    Historical Provider, MD  fluticasone (FLONASE) 50 MCG/ACT nasal spray Place 2 sprays into both nostrils daily. 11/12/15   Lauree Chandler, NP  gabapentin (NEURONTIN) 300 MG capsule Take 2 capsules (600 mg total) by mouth 3 (three) times daily. 11/12/15   Lauree Chandler, NP  glucose blood (ONETOUCH VERIO) test strip 1 each by Other route 3 (three) times daily. E11.29 06/12/15   Tiffany L Reed, DO  hydrALAZINE (APRESOLINE) 25 MG tablet Take 1 tablet (25 mg total) by mouth 3 (three) times daily. 11/12/15   Lauree Chandler, NP  ibuprofen (ADVIL,MOTRIN) 600 MG tablet Take 1 tablet (600 mg total) by mouth every 8 (eight) hours as needed. for pain 11/27/15   Estill Dooms, MD  Insulin Glargine (TOUJEO SOLOSTAR) 300 UNIT/ML SOPN Inject 86 Units into the skin daily. Reported on 07/18/2015 11/21/15   Lauree Chandler, NP  Insulin Lispro (HUMALOG KWIKPEN) 200 UNIT/ML SOPN Inject 5  Units into the skin 3 (three) times daily with meals. 06/07/15   Tiffany L Reed, DO  lisinopril (PRINIVIL,ZESTRIL) 20 MG tablet Take 1 tablet (20 mg total) by mouth daily. 11/12/15   Lauree Chandler, NP  mirabegron ER (MYRBETRIQ) 50 MG TB24 tablet Take 1 tablet (50 mg total) by mouth daily. 11/12/15   Lauree Chandler, NP  ondansetron (ZOFRAN-ODT) 4 MG disintegrating tablet TAKE 1 TABLET BY MOUTH EVERY 8 HOURS AS NEEDED FOR NAUSEA OR VOMITING 04/08/15   Lauree Chandler, NP  Procedure [facility] DELICA LANCETS 17G MISC 1 each by Does not apply route 3 (three) times daily before meals. 11/11/15 02/09/16  Tivis Ringer, RPH-CPP  pantoprazole (PROTONIX) 40 MG tablet Take 1 tablet (40 mg total) by mouth daily. 11/12/15   Lauree Chandler, NP  polyethylene glycol (MIRALAX / GLYCOLAX) packet Take 17 g by mouth daily as needed for mild constipation. 06/10/15   Tiffany L Reed, DO  potassium chloride SA (K-DUR,KLOR-CON) 20 MEQ tablet Take 2 tablets (40 mEq total) by mouth daily. 11/12/15   Lauree Chandler, NP  ranitidine (ZANTAC) 150 MG tablet Take 1 tablet (150 mg total) by mouth 2 (two) times daily. 11/12/15   Lauree Chandler, NP  simvastatin (ZOCOR) 20 MG tablet Take one tablet by mouth every morning for cholesterol 11/12/15   Lauree Chandler, NP  topiramate (TOPAMAX) 200 MG tablet Take 1 tablet (200 mg total) by mouth 2 (two) times daily. 11/12/15   Lauree Chandler, NP  verapamil (VERELAN PM) 240 MG 24 hr capsule TAKE ONE CAPSULE BY MOUTH EVERY NIGHT AT BEDTIME FOR BLOOD PRESSURE 11/12/15   Lauree Chandler, NP    Family History Family History  Problem Relation Age of Onset  . Uterine cancer Mother   . Hypertension Mother   . Brain cancer Maternal Grandmother   . Hypertension Maternal Grandmother   . Cirrhosis Maternal Grandfather   . Anesthesia problems Neg Hx   . Colon cancer Neg Hx   . Esophageal cancer  Neg Hx   . Pancreatic cancer Neg Hx     Social History Social History  Substance Use  Topics  . Smoking status: Former Smoker    Packs/day: 1.00    Years: 30.00    Types: Cigarettes    Quit date: 01/20/1987  . Smokeless tobacco: Never Used     Comment: quit more than 20years  . Alcohol use 0.0 oz/week     Comment: 1 glass weekly     Allergies   Codeine and Lactose intolerance (gi) Review of Systems Review of Systems  Constitutional: Negative for chills and fever.  Cardiovascular: Negative for chest pain.  Genitourinary: Positive for flank pain. Negative for vaginal pain.  All other systems reviewed and are negative.    Physical Exam Updated Vital Signs BP (!) 168/111 (BP Location: Left Arm)   Pulse 86   Temp 98.6 F (37 C) (Oral)   Resp 20   Ht 5' 6"  (1.676 m)   Wt 230 lb (104.3 kg)   SpO2 100%   BMI 37.12 kg/m   Physical Exam  Constitutional: She appears well-developed and well-nourished.  HENT:  Head: Normocephalic.  Mouth/Throat: Oropharynx is clear and moist. No oropharyngeal exudate.  Eyes: Conjunctivae and EOM are normal. Pupils are equal, round, and reactive to light. Right eye exhibits no discharge. Left eye exhibits no discharge. No scleral icterus.  Neck: Normal range of motion. Neck supple. No JVD present. No tracheal deviation present.  Trachea is midline. No stridor or carotid bruits.   Cardiovascular: Normal rate, regular rhythm, normal heart sounds and intact distal pulses.   No murmur heard. Pulmonary/Chest: Effort normal and breath sounds normal. No stridor. No respiratory distress. She has no wheezes. She has no rales.  Lungs CTA bilaterally.  Abdominal: Soft. Bowel sounds are normal. She exhibits no distension. There is no tenderness. There is no rebound and no guarding.  Genitourinary: Cervix exhibits no motion tenderness. Right adnexum displays no tenderness. Left adnexum displays no tenderness. No vaginal discharge found.  Genitourinary Comments: Chaperone present throughout entire exam. No tears. No bleeding.  Musculoskeletal:  Normal range of motion. She exhibits no edema.  All compartments are soft. No palpable cords. Paraspinal muscle spasms. Intact DTRs, lower 2+.  Lymphadenopathy:    She has no cervical adenopathy.  Neurological: She is alert. She has normal reflexes. She displays normal reflexes. She exhibits normal muscle tone.  Skin: Skin is warm and dry. Capillary refill takes less than 2 seconds.  Psychiatric: She has a normal mood and affect. Her behavior is normal.  Nursing note and vitals reviewed.    ED Treatments / Results  DIAGNOSTIC STUDIES: Oxygen Saturation is 100% on RA, normal by my interpretation.    Results for orders placed or performed during the hospital encounter of 12/22/15  Wet prep, genital  Result Value Ref Range   Yeast Wet Prep HPF POC NONE SEEN NONE SEEN   Trich, Wet Prep NONE SEEN NONE SEEN   Clue Cells Wet Prep HPF POC NONE SEEN NONE SEEN   WBC, Wet Prep HPF POC FEW (A) NONE SEEN   Sperm NONE SEEN   Urinalysis, Routine w reflex microscopic- may I&O cath if menses  Result Value Ref Range   Color, Urine RED (A) YELLOW   APPearance CLOUDY (A) CLEAR   Specific Gravity, Urine 1.017 1.005 - 1.030   pH 7.0 5.0 - 8.0   Glucose, UA >1000 (A) NEGATIVE mg/dL   Hgb urine dipstick LARGE (A) NEGATIVE  Bilirubin Urine NEGATIVE NEGATIVE   Ketones, ur NEGATIVE NEGATIVE mg/dL   Protein, ur 100 (A) NEGATIVE mg/dL   Nitrite NEGATIVE NEGATIVE   Leukocytes, UA MODERATE (A) NEGATIVE  Urine microscopic-add on  Result Value Ref Range   Squamous Epithelial / LPF 0-5 (A) NONE SEEN   WBC, UA 6-30 0 - 5 WBC/hpf   RBC / HPF TOO NUMEROUS TO COUNT 0 - 5 RBC/hpf   Bacteria, UA FEW (A) NONE SEEN  I-Stat Chem 8, ED  Result Value Ref Range   Sodium 139 135 - 145 mmol/L   Potassium 3.6 3.5 - 5.1 mmol/L   Chloride 101 101 - 111 mmol/L   BUN 14 6 - 20 mg/dL   Creatinine, Ser 0.90 0.44 - 1.00 mg/dL   Glucose, Bld 311 (H) 65 - 99 mg/dL   Calcium, Ion 1.22 1.15 - 1.40 mmol/L   TCO2 24 0 - 100  mmol/L   Hemoglobin 13.3 12.0 - 15.0 g/dL   HCT 39.0 36.0 - 46.0 %  I-Stat Beta hCG blood, ED ([facility], [facility], [facility] only)  Result Value Ref Range   I-stat hCG, quantitative <5.0 <5 mIU/mL   Comment 3           Ct Renal Stone Study  Result Date: 12/23/2015 CLINICAL DATA:  Acute onset of right flank pain. Vaginal bleeding with urination. Red blood cells and white blood cells in the urine. Initial encounter. EXAM: CT ABDOMEN AND PELVIS WITHOUT CONTRAST TECHNIQUE: Multidetector CT imaging of the abdomen and pelvis was performed following the standard protocol without IV contrast. COMPARISON:  None. FINDINGS: Lower chest: Trace right-sided pleural fluid is noted, with minimal bibasilar atelectasis. The visualized portions of the mediastinum are unremarkable. Hepatobiliary: The liver is unremarkable in appearance. The gallbladder is unremarkable in appearance. The common bile duct remains normal in caliber. Pancreas: The pancreas is within normal limits. Spleen: The spleen is unremarkable in appearance. Adrenals/Urinary Tract: A 2.0 cm right adrenal adenoma is noted. The left adrenal gland is unremarkable in appearance. Nonobstructing bilateral renal stones are noted, more prominent on the right, measuring up to 5 mm in size. There is no evidence of hydronephrosis. However, diffuse stranding is noted about the dilated right ureter, which could reflect a recently passed stone. Underlying ureteritis is a concern. No perinephric stranding is appreciated. Stomach/Bowel: The stomach is unremarkable in appearance. The small bowel is within normal limits. The appendix is normal in caliber, without evidence of appendicitis. Mild diverticulosis is noted along the proximal sigmoid colon, without evidence of diverticulitis. Vascular/Lymphatic: The abdominal aorta is unremarkable in appearance. The inferior vena cava is grossly unremarkable. No retroperitoneal lymphadenopathy is seen. No pelvic sidewall lymphadenopathy is identified.  Reproductive: The bladder is mildly distended and grossly unremarkable. The patient is status post hysterectomy. No suspicious adnexal masses are seen. The ovaries are relatively symmetric, with a small left-sided follicle. Other: No additional soft tissue abnormalities are seen. Musculoskeletal: No acute osseous abnormalities are identified. The visualized musculature is unremarkable in appearance. IMPRESSION: 1. No evidence of hydronephrosis. Diffuse stranding noted about the dilated right ureter, which could reflect a recently passed stone. Underlying ureteritis is a concern. 2. Trace right-sided pleural fluid, with minimal bibasilar atelectasis. 3. 2.0 cm right adrenal adenoma incidentally noted. 4. Mild diverticulosis along the proximal sigmoid colon, without evidence of diverticulitis. Electronically Signed   By: Garald Balding M.D.   On: 12/23/2015 00:53    COORDINATION OF CARE: 11:37 PM Discussed treatment plan with pt at bedside which  includes urinalysis and pt agreed to plan.  Procedures Procedures (including critical care time)  Medications Ordered in ED Medications  ketorolac (TORADOL) injection 60 mg (60 mg Intramuscular Given 12/23/15 0005)   All questions answered to patient's satisfaction. Based on history and exam patient has been appropriately medically screened and emergency conditions excluded. Patient is stable for discharge at this time. Follow up with your PMD for recheck in 2 days and strict return precautions given.    Will treat with 7 days of antibiotics for ureteritis and NSAIDs and have patient follow up with her PMD and urology.    I personally performed the services described in this documentation, which was scribed in my presence. The recorded information has been reviewed and is accurate.      Veatrice Kells, MD 12/23/15 [phone]

## 2015-12-23 ENCOUNTER — Emergency Department (HOSPITAL_COMMUNITY): Payer: Medicare Other

## 2015-12-23 DIAGNOSIS — N2889 Other specified disorders of kidney and ureter: Secondary | ICD-10-CM | POA: Diagnosis not present

## 2015-12-23 LAB — I-STAT BETA HCG BLOOD, ED (MC, WL, AP ONLY): I-stat hCG, quantitative: <5 m[IU]/mL (ref <5)

## 2015-12-23 MED ORDER — NAPROXEN 375 MG PO TABS: 375.0000 mg | ORAL_TABLET | Freq: Two times a day (BID) | ORAL | 0 refills | Status: DC

## 2015-12-23 MED ORDER — TAMSULOSIN HCL 0.4 MG PO CAPS: 0.4000 mg | ORAL_CAPSULE | Freq: Every day | ORAL | 0 refills | Status: DC

## 2015-12-23 MED ORDER — CEPHALEXIN 500 MG PO CAPS: 500.0000 mg | ORAL_CAPSULE | Freq: Four times a day (QID) | ORAL | 0 refills | Status: DC

## 2015-12-23 MED ORDER — CEPHALEXIN 500 MG PO CAPS
500.0000 mg | ORAL_CAPSULE | Freq: Once | ORAL | Status: AC
  Administered 2015-12-23: 500 mg via ORAL
  Filled 2015-12-23: qty 2

## 2015-12-23 NOTE — ED Notes (Signed)
Patient transported to CT 

## 2015-12-23 NOTE — ED Notes (Signed)
No reaction to medication noted alert and oriented x 3 visitor at bedside call light in reach no respiratory or acute distress noted.

## 2015-12-24 ENCOUNTER — Encounter: Payer: Self-pay | Admitting: Nurse Practitioner

## 2015-12-24 ENCOUNTER — Ambulatory Visit (INDEPENDENT_AMBULATORY_CARE_PROVIDER_SITE_OTHER): Payer: Medicare Other | Admitting: Nurse Practitioner

## 2015-12-24 VITALS — BP 148/98 | HR 87 | Temp 97.9°F | Resp 19 | Ht 66.0 in | Wt 243.2 lb

## 2015-12-24 DIAGNOSIS — I10 Essential (primary) hypertension: Secondary | ICD-10-CM

## 2015-12-24 DIAGNOSIS — N200 Calculus of kidney: Secondary | ICD-10-CM | POA: Diagnosis not present

## 2015-12-24 LAB — GC/CHLAMYDIA PROBE AMP (~~LOC~~) NOT AT ARMC
Chlamydia: NEGATIVE
Neisseria Gonorrhea: NEGATIVE

## 2015-12-24 NOTE — Progress Notes (Signed)
Careteam: Patient Care Team: Lauree Chandler, NP as PCP - General (Nurse Practitioner) My Margret Chance, OD as Referring Physician (Optometry)  Advanced Directive information Does Patient Have a Medical Advance Directive?: No, Would patient like information on creating a medical advance directive?: Yes (MAU/Ambulatory/Procedural Areas - Information given)  Allergies  Allergen Reactions  . Codeine Swelling and Other (See Comments)    Swelling and burning of mouth (inside)  . Lactose Intolerance (Gi) Nausea And Vomiting    Chief Complaint  Patient presents with  . Acute Visit    ED follow up for UTI     HPI: Patient is a 50 y.o. female seen in the office today to follow up ED visit due to flank pain and vaginal bleeding. Pelvic exam was unremarkable but UA abnormal. Imagining appears like she had kidney stones.  she was treated with keflex. Appears no culture was sent. She was also sent home with naproxen and flomax. Pt reports pain and blood has improved.  Pt saw urologist this morning, imaging was done there and she has kidney stones. Started keflex on Monday.  Taking the naproxen twice daily- only taking as needed and not having pain currently. Review of Systems:  Review of Systems  Constitutional: Negative for chills and fever.  HENT: Negative for hearing loss.   Eyes: Positive for visual disturbance.  Respiratory: Negative for cough and shortness of breath.   Cardiovascular: Negative for chest pain and leg swelling.  Gastrointestinal: Negative for abdominal pain, blood in stool and constipation.  Genitourinary: Positive for hematuria (blood tinged). Negative for difficulty urinating, dysuria, flank pain, frequency and pelvic pain.  Neurological: Negative for dizziness and seizures.  Hematological:       Diabetes    Past Medical History:  Diagnosis Date  . Anxiety   . Arthritis    joint pain   . Asthma   . COPD (chronic obstructive pulmonary disease) (HCC)    chronic bronchitis   . CVA 11/14/2007   Qualifier: Diagnosis of  By: Hassell Done [name], Tori Milks    . Depression   . Diabetes mellitus    since age 79; type 2 IDDM  . Diabetic neuropathy, painful ([place])    FEET AND HANDS  . Dilated aortic root ([place])    50m by echo 11/2015  . Dizziness    secondary to diabetes and hypertension   . Dysrhythmia    skipped beats  . Epilepsy idiopathic [place]it mal [place]    last seizure 2012;controlled w/ topomax  . GERD (gastroesophageal reflux disease)   . Glaucoma    NOT ON ANY EYE DROPS   . Headache(784.0)    migraines   . Heart murmur    born with   . Hx of blood clots    hematomas removed from left side of brain from [id number] [id number]   . Hyperlipidemia   . Hypertension   . Legally blind    left eye   . MIGRAINE HEADACHE 09/14/2006   Qualifier: Diagnosis of  By: MarHassell DoneP, NykTori Milks . Myocardial infarction    hi of x 2  last one 2003   . Nephrolithiasis    frequent urination , urination at nite  PT SEEN IN ER 09/22/11 FOR BACK AND RT SIDED PAIN--HAS KNOWN STONE RT URETER AND UA IN ER SHOWED UTI  . Neuropathy ([facility]  due to diabetes   . Pain 09/23/11   LOWER BACK AND RIGHT SIDE--PT HAS RIGHT URETERAL STONE  .  Pneumonia    hx of 2009  . Pyelonephritis   . Seizures ([place])   . Sleep apnea    [facility];does not [facility] ; mild  . Stress incontinence   . Stroke [facility])    last 2003  RESIDUAL LEFT LEG WEAKNESS--NO OTHER RESIDUAL PROBLEMS   Past Surgical History:  Procedure Laterality Date  . BRAIN HEMATOMA EVACUATION     five procedures total, first procedure when 79 months old, last at 49 years of age.  . CYSTOSCOPY W/ RETROGRADES  09/24/2011   Procedure: CYSTOSCOPY WITH RETROGRADE PYELOGRAM;  Surgeon: Malka So, MD;  Location: [facility];  Service: Urology;  Laterality: Right;  . CYSTOSCOPY WITH URETEROSCOPY  09/24/2011   Procedure: CYSTOSCOPY WITH URETEROSCOPY;  Surgeon: Malka So, MD;  Location: [facility];  Service: Urology;   Laterality: Right;  Balloon dilation right ureter   . CYSTOSCOPY/RETROGRADE/URETEROSCOPY  08/24/2011   Procedure: CYSTOSCOPY/RETROGRADE/URETEROSCOPY;  Surgeon: Molli Hazard, MD;  Location: [facility];  Service: Urology;  Laterality: Right;  Cysto, Right retrograde Pyelogram, right stent placement.   Fatima Blank HERNIA REPAIR  05/05/2011   Procedure: LAPAROSCOPIC INCISIONAL HERNIA;  Surgeon: Rolm Bookbinder, MD;  Location: [facility];  Service: General;  Laterality: N/A;  . kidney stone removal    . MASS EXCISION  05/05/2011   Procedure: EXCISION MASS;  Surgeon: Rolm Bookbinder, MD;  Location: [facility];  Service: General;  Laterality: Right;  . PCNL    . RETINAL DETACHMENT SURGERY Left 1990  . SHUNT REMOVAL     shunt inserted at age 24 removed at age 3   . VAGINAL HYSTERECTOMY  1996   Social History:   reports that she quit smoking about 28 years ago. Her smoking use included Cigarettes. She has a 30.00 pack-year smoking history. She has never used smokeless tobacco. She reports that she drinks alcohol. She reports that she does not use drugs.  Family History  Problem Relation Age of Onset  . Uterine cancer Mother   . Hypertension Mother   . Brain cancer Maternal Grandmother   . Hypertension Maternal Grandmother   . Cirrhosis Maternal Grandfather   . Anesthesia problems Neg Hx   . Colon cancer Neg Hx   . Esophageal cancer Neg Hx   . Pancreatic cancer Neg Hx     Medications: Patient's Medications  New Prescriptions   No medications on file  Previous Medications   ALBUTEROL (PROAIR HFA) 108 (90 BASE) MCG/ACT INHALER    INHALE 2 PUFFS BY MOUTH EVERY 6 HOURS AS NEEDED FOR WHEEZING OR SHORTNESS OF BREATH   AMBULATORY NON FORMULARY MEDICATION    Medication Name: Muscle and Joint Vanishing Scent Gel- Apply as needed to skin   BACLOFEN (LIORESAL) 10 MG TABLET    TAKE 1 TABLET BY MOUTH 3 TIMES A DAY AS NEEDED FOR MUSCLE SPASMS   BUDESONIDE-FORMOTEROL (SYMBICORT) 160-4.5 MCG/ACT INHALER     Inhale 2 puffs into the lungs daily.   CALCIUM CARBONATE-VITAMIN D (CALCIUM-VITAMIN D3 PO)    D3 500- calcium 630 mg Take 1 capsule by mouth once daily   CEPHALEXIN (KEFLEX) 500 MG CAPSULE    Take 1 capsule (500 mg total) by mouth 4 (four) times daily.   CLOPIDOGREL (PLAVIX) 75 MG TABLET    Take 1 tablet (75 mg total) by mouth daily.   CYCLOSPORINE (RESTASIS) 0.05 % OPHTHALMIC EMULSION    Place 2 drops into both eyes 2 (two) times daily.   FLUOROMETHOLONE (FML) 0.1 %  OPHTHALMIC SUSPENSION    Place 2 drops into both eyes 2 (two) times daily.   FLUTICASONE (FLONASE) 50 MCG/ACT NASAL SPRAY    Place 2 sprays into both nostrils daily.   GABAPENTIN (NEURONTIN) 300 MG CAPSULE    Take 2 capsules (600 mg total) by mouth 3 (three) times daily.   GLUCOSE BLOOD (ONETOUCH VERIO) TEST STRIP    1 each by Other route 3 (three) times daily. E11.29   HYDRALAZINE (APRESOLINE) 25 MG TABLET    Take 1 tablet (25 mg total) by mouth 3 (three) times daily.   IBUPROFEN (ADVIL,MOTRIN) 600 MG TABLET    Take 1 tablet (600 mg total) by mouth every 8 (eight) hours as needed. for pain   INSULIN GLARGINE (TOUJEO SOLOSTAR) 300 UNIT/ML SOPN    Inject 86 Units into the skin daily. Reported on 07/18/2015   INSULIN LISPRO (HUMALOG KWIKPEN) 200 UNIT/ML SOPN    Inject 5 Units into the skin 3 (three) times daily with meals.   LISINOPRIL (PRINIVIL,ZESTRIL) 20 MG TABLET    Take 1 tablet (20 mg total) by mouth daily.   MIRABEGRON ER (MYRBETRIQ) 50 MG TB24 TABLET    Take 1 tablet (50 mg total) by mouth daily.   NAPROXEN (NAPROSYN) 375 MG TABLET    Take 1 tablet (375 mg total) by mouth 2 (two) times daily.   ONDANSETRON (ZOFRAN-ODT) 4 MG DISINTEGRATING TABLET    TAKE 1 TABLET BY MOUTH EVERY 8 HOURS AS NEEDED FOR NAUSEA OR VOMITING   ONETOUCH DELICA LANCETS 48G MISC    1 each by Does not apply route 3 (three) times daily before meals.   PANTOPRAZOLE (PROTONIX) 40 MG TABLET    Take 1 tablet (40 mg total) by mouth daily.   POLYETHYLENE GLYCOL  (MIRALAX / GLYCOLAX) PACKET    Take 17 g by mouth daily as needed for mild constipation.   POTASSIUM CHLORIDE SA (K-DUR,KLOR-CON) 20 MEQ TABLET    Take 2 tablets (40 mEq total) by mouth daily.   RANITIDINE (ZANTAC) 150 MG TABLET    Take 1 tablet (150 mg total) by mouth 2 (two) times daily.   SIMVASTATIN (ZOCOR) 20 MG TABLET    Take one tablet by mouth every morning for cholesterol   TAMSULOSIN (FLOMAX) 0.4 MG CAPS CAPSULE    Take 1 capsule (0.4 mg total) by mouth daily.   TOPIRAMATE (TOPAMAX) 200 MG TABLET    Take 1 tablet (200 mg total) by mouth 2 (two) times daily.   VERAPAMIL (VERELAN PM) 240 MG 24 HR CAPSULE    TAKE ONE CAPSULE BY MOUTH EVERY NIGHT AT BEDTIME FOR BLOOD PRESSURE  Modified Medications   No medications on file  Discontinued Medications   No medications on file     Physical Exam:  Vitals:   12/24/15 1535  Pulse: 87  Resp: 19  Temp: 97.9 F (36.6 C)  TempSrc: Oral  SpO2: 96%  Weight: 243 lb 3.2 oz (110.3 kg)  Height: 5' 6"  (1.676 m)   Body mass index is 39.25 kg/m.  Physical Exam  Constitutional: She is oriented to person, place, and time. She appears well-developed and well-nourished. No distress.  Obese black female  HENT:  Head: Normocephalic and atraumatic.  Right Ear: External ear normal.  Left Ear: External ear normal.  Nose: Nose normal.  Mouth/Throat: Oropharynx is clear and moist.  Eyes: Conjunctivae and EOM are normal. Pupils are equal, round, and reactive to light.  Proptosis of eyes  Neck: Normal range of motion.  Neck supple.  Cardiovascular: Normal rate, regular rhythm, normal heart sounds and intact distal pulses.   Pulmonary/Chest: Effort normal and breath sounds normal. No respiratory distress.  Abdominal: Soft. Normal appearance and bowel sounds are normal. She exhibits no distension. There is no tenderness. There is no rebound and no CVA tenderness.  Genitourinary: Vagina normal.  Musculoskeletal: She exhibits no tenderness.    Neurological: She is alert and oriented to person, place, and time.  Skin: Skin is warm and dry.  Psychiatric: She has a normal mood and affect.    Labs reviewed: Basic Metabolic Panel:  Recent Labs  05/08/15 1106  07/26/15 0949 07/26/15 1001 11/11/15 1115 12/22/15 2338  NA 138  < > 137 CANCELED 139 139  K 4.2  --  4.2 CANCELED 4.2 3.6  CL 102  --  110 CANCELED 109 101  CO2 19  --  19* CANCELED 22  --   GLUCOSE 218*  < > 203* CANCELED 252* 311*  BUN 14  < > 14 CANCELED 13 14  CREATININE 1.10*  < > 1.05 CANCELED 1.03 0.90  CALCIUM 9.8  --  9.1 CANCELED 9.1  --   TSH 0.711  --   --   --   --   --   < > = values in this interval not displayed. Liver Function Tests:  Recent Labs  07/26/15 0949 07/26/15 1001 11/11/15 1115  AST 8* CANCELED 8*  ALT 11 CANCELED 9  ALKPHOS 62 CANCELED 62  BILITOT 0.3 CANCELED 0.2  PROT 6.9 CANCELED 6.6  ALBUMIN 3.8 CANCELED 3.6   No results for input(s): LIPASE, AMYLASE in the last 8760 hours. No results for input(s): AMMONIA in the last 8760 hours. CBC:  Recent Labs  05/08/15 1106 12/22/15 2338  WBC 7.7  --   NEUTROABS 5.5  --   HGB  --  13.3  HCT 39.5 39.0  MCV 91  --   PLT 274  --    Lipid Panel:  Recent Labs  05/08/15 1106  CHOL 167  HDL 48  LDLCALC 99  TRIG 98  CHOLHDL 3.5   TSH:  Recent Labs  05/08/15 1106  TSH 0.711   A1C: Lab Results  Component Value Date   HGBA1C 7.5 (H) 11/11/2015     Assessment/Plan 1. Kidney stones Followed up with urologist today who confirmed kidney stones. Currently without pain. To keep hydrated  -conts on flomax -To cont keflex until complete -To use naproxen as needed only- not to use advil or other NSAIDS when using naproxen   2. Essential hypertension, benign Blood pressure slightly elevated today, encouraged to only use naproxen as needed, dietary and medication compliance.   Follow up as needed and to keep routine appt  Jessica K. Harle Battiest  [facility] [phone] 8 am - 5 pm) [phone] (after hours)

## 2016-01-06 ENCOUNTER — Telehealth: Payer: Self-pay | Admitting: Cardiology

## 2016-01-06 DIAGNOSIS — I77810 Thoracic aortic ectasia: Secondary | ICD-10-CM

## 2016-01-06 NOTE — Telephone Encounter (Signed)
[name] is calling in regards to her test results . Please call

## 2016-01-06 NOTE — Telephone Encounter (Signed)
Notes Recorded by Sueanne Margarita, MD on 12/14/2015 at 6:46 PM EST Normal LVF with mild LVH and increased stiffness of heart muscle. There is mild leakiness of AV and mildly dilated aorta - repeat limited echo in 1 year for dilated aortic root  Notes Recorded by Sueanne Margarita, MD on 12/22/2015 at 2:59 PM EST Please let patient know that stress test was fine   Informed patient of results and verbal understanding expressed.  Repeat ECHO ordered to be scheduled in 1 year. Patient agrees with treatment plan.

## 2016-01-31 ENCOUNTER — Telehealth: Payer: Self-pay | Admitting: Nurse Practitioner

## 2016-01-31 NOTE — Telephone Encounter (Signed)
left msg asking pt to confirm this AWV appt w/ nurse. VDM (DD)

## 2016-02-06 ENCOUNTER — Ambulatory Visit: Payer: Medicare Other | Admitting: Nurse Practitioner

## 2016-02-17 ENCOUNTER — Ambulatory Visit: Payer: Medicare Other | Admitting: Pharmacotherapy

## 2016-02-20 ENCOUNTER — Ambulatory Visit: Payer: Medicare Other | Admitting: Nurse Practitioner

## 2016-02-24 ENCOUNTER — Ambulatory Visit: Payer: Medicare Other | Admitting: Pharmacotherapy

## 2016-02-24 ENCOUNTER — Encounter: Payer: Self-pay | Admitting: Nurse Practitioner

## 2016-02-24 DIAGNOSIS — Z0289 Encounter for other administrative examinations: Secondary | ICD-10-CM

## 2016-03-05 ENCOUNTER — Ambulatory Visit: Payer: Medicare Other | Admitting: Nurse Practitioner

## 2016-03-09 ENCOUNTER — Ambulatory Visit: Payer: 59 | Admitting: Pharmacotherapy

## 2016-03-23 ENCOUNTER — Ambulatory Visit: Payer: Medicare Other | Admitting: Pharmacotherapy

## 2016-03-23 ENCOUNTER — Encounter: Payer: Self-pay | Admitting: Nurse Practitioner

## 2016-03-24 ENCOUNTER — Ambulatory Visit: Payer: Medicare Other

## 2016-04-13 ENCOUNTER — Encounter: Payer: Self-pay | Admitting: Nurse Practitioner

## 2016-04-13 ENCOUNTER — Ambulatory Visit (INDEPENDENT_AMBULATORY_CARE_PROVIDER_SITE_OTHER): Payer: Medicare Other | Admitting: Nurse Practitioner

## 2016-04-13 VITALS — BP 128/82 | HR 91 | Temp 98.1°F | Resp 18 | Ht 66.0 in | Wt 240.2 lb

## 2016-04-13 DIAGNOSIS — K219 Gastro-esophageal reflux disease without esophagitis: Secondary | ICD-10-CM

## 2016-04-13 DIAGNOSIS — I252 Old myocardial infarction: Secondary | ICD-10-CM

## 2016-04-13 DIAGNOSIS — I10 Essential (primary) hypertension: Secondary | ICD-10-CM | POA: Diagnosis not present

## 2016-04-13 DIAGNOSIS — E1121 Type 2 diabetes mellitus with diabetic nephropathy: Secondary | ICD-10-CM

## 2016-04-13 DIAGNOSIS — F418 Other specified anxiety disorders: Secondary | ICD-10-CM

## 2016-04-13 DIAGNOSIS — H548 Legal blindness, as defined in USA: Secondary | ICD-10-CM | POA: Diagnosis not present

## 2016-04-13 DIAGNOSIS — F419 Anxiety disorder, unspecified: Secondary | ICD-10-CM

## 2016-04-13 DIAGNOSIS — F329 Major depressive disorder, single episode, unspecified: Secondary | ICD-10-CM

## 2016-04-13 DIAGNOSIS — R569 Unspecified convulsions: Secondary | ICD-10-CM

## 2016-04-13 DIAGNOSIS — G4733 Obstructive sleep apnea (adult) (pediatric): Secondary | ICD-10-CM | POA: Diagnosis not present

## 2016-04-13 DIAGNOSIS — E1165 Type 2 diabetes mellitus with hyperglycemia: Secondary | ICD-10-CM | POA: Diagnosis not present

## 2016-04-13 DIAGNOSIS — F32A Depression, unspecified: Secondary | ICD-10-CM

## 2016-04-13 DIAGNOSIS — E782 Mixed hyperlipidemia: Secondary | ICD-10-CM

## 2016-04-13 MED ORDER — INSULIN LISPRO 200 UNIT/ML ~~LOC~~ SOPN: 5.0000 [IU] | PEN_INJECTOR | Freq: Three times a day (TID) | SUBCUTANEOUS | 3 refills | Status: DC

## 2016-04-13 NOTE — Progress Notes (Signed)
Careteam: Patient Care Team: Lauree Chandler, NP as PCP - General (Nurse Practitioner) My Margret Chance, OD as Referring Physician (Optometry)  Advanced Directive information Does Patient Have a Medical Advance Directive?: No, Would patient like information on creating a medical advance directive?: Yes (MAU/Ambulatory/Procedural Areas - Information given)  Allergies  Allergen Reactions  . Codeine Swelling and Other (See Comments)    Swelling and burning of mouth (inside)  . Lactose Intolerance (Gi) Nausea And Vomiting    Chief Complaint  Patient presents with  . Medical Management of Chronic Issues    pt is being seen for 3 month follow up.   . Other    Pt would like a permanent handicap placard due to trouble walking and blindness in left eye.      HPI: Patient is a 51 y.o. female seen in the office today for routine follow up on chronic condition.  Pt was seen in December after ED visit for hematuria and kidney stones. No additional issues- following with urologist. conts on flomax Pt with hx of diabetes, OSA, seizure disorder, migraine, HTN, CVA, blindness, anxiety and depression.   Essential hypertension- well controlled.   Uncontrolled type 2 diabetes mellitus with diabetic- blood sugars ranging 70-120s, reports nursing took A1c and it was 7.    MYOCARDIAL INFARCTION, HX OF- conts on plavix, no additional chest pains.   OSA (obstructive sleep apnea)- using CPAP, following with pulmonary.   Gastroesophageal reflux disease without esophagitis-taking protonix and zantac which controls this.   Anxiety and depression- in remission, getting married soon.   BLINDNESS, LEGAL, [place] DEFINITION- following by ophthalmologist who is treating her cataracts   Mixed hyperlipidemia-has not fasted today, taking zocor LDL- 99, attempting heart health diet, goal <70.   Chronic pain- in lower back and legs, have not been seeing chronic pain specialist in over a year after she was  discharged from PT and 3rd pain shot she stopped going. Taking naproxen twice daily   Review of Systems:  Review of Systems  Constitutional: Negative for chills and fever.  HENT: Negative for hearing loss.   Eyes: Positive for visual disturbance.  Respiratory: Negative for cough and shortness of breath.   Cardiovascular: Negative for chest pain and leg swelling.  Gastrointestinal: Negative for abdominal pain, blood in stool and constipation.  Genitourinary: Negative for difficulty urinating, flank pain, frequency, hematuria and pelvic pain.  Musculoskeletal: Positive for arthralgias, back pain, gait problem and myalgias.  Neurological: Negative for dizziness and seizures.  Hematological:       Diabetes    Past Medical History:  Diagnosis Date  . Anxiety   . Arthritis    joint pain   . Asthma   . COPD (chronic obstructive pulmonary disease) (HCC)    chronic bronchitis   . CVA 11/14/2007   Qualifier: Diagnosis of  By: Hassell Done [name], Tori Milks    . Depression   . Diabetes mellitus    since age 64; type 2 IDDM  . Diabetic neuropathy, painful ([place])    FEET AND HANDS  . Dilated aortic root ([place])    30m by echo 11/2015  . Dizziness    secondary to diabetes and hypertension   . Dysrhythmia    skipped beats  . Epilepsy idiopathic petit mal ([facility]    last seizure 2012;controlled w/ topomax  . GERD (gastroesophageal reflux disease)   . Glaucoma    NOT ON ANY EYE DROPS   . Headache(784.0)    migraines   .  Heart murmur    born with   . Hx of blood clots    hematomas removed from left side of brain from 37mo to 89yrold   . Hyperlipidemia   . Hypertension   . Legally blind    left eye   . MIGRAINE HEADACHE 09/14/2006   Qualifier: Diagnosis of  By: MaHassell DoneNP, NyTori Milks  . Myocardial infarction    hi of x 2  last one 2003   . Nephrolithiasis    frequent urination , urination at nite  PT SEEN IN ER 09/22/11 FOR BACK AND RT SIDED PAIN--HAS KNOWN STONE RT URETER AND UA IN ER SHOWED  UTI  . [place] ([facility] [place]   due to diabetes   . Pain 09/23/11   LOWER BACK AND RIGHT SIDE--PT HAS RIGHT URETERAL STONE  . Pneumonia    hx of 2009  . Pyelonephritis   . Seizures ([facility]  . [facility] ap[facility]    [facility] ; mild  . Stress incontinence   . [facility]   last 2003  RESIDUAL LEFT LEG WEAKNESS--NO OTHER RESIDUAL PROBLEMS   Past Surgical History:  Procedure Laterality Date  . BRAIN HEMATOMA EVACUATION     five procedures total, first procedure when [age] monthsld, last at [age]ears of age.  . CYSTOSCOPY W/ RETROGRADES  09/24/2011   Procedure: CYSTOSCOPY WITH RETROGRADE PYELOGRAM;  Surgeon: JoMalka SoMD;  Location: [facility];  Service: Urology;  Laterality: Right;  . CYSTOSCOPY WITH URETEROSCOPY  09/24/2011   Procedure: CYSTOSCOPY WITH URETEROSCOPY;  Surgeon: JoMalka SoMD;  Location: [facility];  Service: Urology;  Laterality: Right;  Balloon dilation right ureter   . CYSTOSCOPY/RETROGRADE/URETEROSCOPY  08/24/2011   Procedure: CYSTOSCOPY/RETROGRADE/URETEROSCOPY;  Surgeon: DaMolli HazardMD;  Location: [facility];  Service: Urology;  Laterality: Right;  Cysto, Right retrograde Pyelogram, right stent placement.   . Fatima BlankERNIA REPAIR  05/05/2011   Procedure: LAPAROSCOPIC INCISIONAL HERNIA;  Surgeon: MaRolm BookbinderMD;  [facility] Service: General;  Laterality: N/A;  . kidney stone removal    . MASS EXCISION  05/05/2011   Procedure: EXCISION MASS;  Surgeon: MaRolm BookbinderMD;  [facility] Service: General;  Laterality: Right;  . PCNL    . RETINAL DETACHMENT SURGERY Left 1990  . SHUNT REMOVAL     shunt inserted at age 50 22emoved at age 51 . VAGINAL HYSTERECTOMY  1996   Social History:   reports that she quit smoking about 29 years ago. Her smoking use included Cigarettes. She has a 30.00 pack-year smoking history. She has never used smokeless tobacco. She reports that she drinks alcohol. She reports that she does not use  drugs.  Family History  Problem Relation Age of Onset  . Uterine cancer Mother   . Hypertension Mother   . Brain cancer Maternal Grandmother   . Hypertension Maternal Grandmother   . Cirrhosis Maternal Grandfather   . Anesthesia problems Neg Hx   . Colon cancer Neg Hx   . Esophageal cancer Neg Hx   . Pancreatic cancer Neg Hx     Medications: Patient's Medications  New Prescriptions   No medications on file  Previous Medications   ALBUTEROL (PROAIR HFA) 108 (90 BASE) MCG/ACT INHALER    INHALE 2 PUFFS BY MOUTH EVERY 6 HOURS AS NEEDED FOR WHEEZING OR SHORTNESS OF BREATH   AMBULATORY NON FORMULARY MEDICATION    Medication  Name: Muscle and Joint Vanishing Scent Gel- Apply as needed to skin   BACLOFEN (LIORESAL) 10 MG TABLET    TAKE 1 TABLET BY MOUTH 3 TIMES A DAY AS NEEDED FOR MUSCLE SPASMS   BUDESONIDE-FORMOTEROL (SYMBICORT) 160-4.5 MCG/ACT INHALER    Inhale 2 puffs into the lungs daily.   CALCIUM CARBONATE-VITAMIN D (CALCIUM-VITAMIN D3 PO)    D3 500- calcium 630 mg Take 1 capsule by mouth once daily   CLOPIDOGREL (PLAVIX) 75 MG TABLET    Take 1 tablet (75 mg total) by mouth daily.   CYCLOSPORINE (RESTASIS) 0.05 % OPHTHALMIC EMULSION    Place 2 drops into both eyes 2 (two) times daily.   FLUOROMETHOLONE (FML) 0.1 % OPHTHALMIC SUSPENSION    Place 2 drops into both eyes 2 (two) times daily.   FLUTICASONE (FLONASE) 50 MCG/ACT NASAL SPRAY    Place 2 sprays into both nostrils daily.   GABAPENTIN (NEURONTIN) 300 MG CAPSULE    Take 2 capsules (600 mg total) by mouth 3 (three) times daily.   GLUCOSE BLOOD (ONETOUCH VERIO) TEST STRIP    1 each by Other route 3 (three) times daily. E11.29   HYDRALAZINE (APRESOLINE) 25 MG TABLET    Take 1 tablet (25 mg total) by mouth 3 (three) times daily.   IBUPROFEN (ADVIL,MOTRIN) 600 MG TABLET    Take 1 tablet (600 mg total) by mouth every 8 (eight) hours as needed. for pain   INSULIN GLARGINE (TOUJEO SOLOSTAR) 300 UNIT/ML SOPN    Inject 86 Units into the skin  daily. Reported on 07/18/2015   INSULIN LISPRO (HUMALOG KWIKPEN) 200 UNIT/ML SOPN    Inject 5 Units into the skin 3 (three) times daily with meals.   LISINOPRIL (PRINIVIL,ZESTRIL) 20 MG TABLET    Take 1 tablet (20 mg total) by mouth daily.   MIRABEGRON ER (MYRBETRIQ) 50 MG TB24 TABLET    Take 1 tablet (50 mg total) by mouth daily.   NAPROXEN (NAPROSYN) 375 MG TABLET    Take 1 tablet (375 mg total) by mouth 2 (two) times daily.   ONDANSETRON (ZOFRAN-ODT) 4 MG DISINTEGRATING TABLET    TAKE 1 TABLET BY MOUTH EVERY 8 HOURS AS NEEDED FOR NAUSEA OR VOMITING   PANTOPRAZOLE (PROTONIX) 40 MG TABLET    Take 1 tablet (40 mg total) by mouth daily.   POLYETHYLENE GLYCOL (MIRALAX / GLYCOLAX) PACKET    Take 17 g by mouth daily as needed for mild constipation.   POTASSIUM CHLORIDE SA (K-DUR,KLOR-CON) 20 MEQ TABLET    Take 2 tablets (40 mEq total) by mouth daily.   RANITIDINE (ZANTAC) 150 MG TABLET    Take 1 tablet (150 mg total) by mouth 2 (two) times daily.   SIMVASTATIN (ZOCOR) 20 MG TABLET    Take one tablet by mouth every morning for cholesterol   TAMSULOSIN (FLOMAX) 0.4 MG CAPS CAPSULE    Take 1 capsule (0.4 mg total) by mouth daily.   TOPIRAMATE (TOPAMAX) 200 MG TABLET    Take 1 tablet (200 mg total) by mouth 2 (two) times daily.   VERAPAMIL (VERELAN PM) 240 MG 24 HR CAPSULE    TAKE ONE CAPSULE BY MOUTH EVERY NIGHT AT BEDTIME FOR BLOOD PRESSURE  Modified Medications   No medications on file  Discontinued Medications   CEPHALEXIN (KEFLEX) 500 MG CAPSULE    Take 1 capsule (500 mg total) by mouth 4 (four) times daily.     Physical Exam:  Vitals:   04/13/16 0932  BP: 128/82  Pulse: 91  Resp: 18  Temp: 98.1 F (36.7 C)  TempSrc: Oral  SpO2: 97%  Weight: 240 lb 3.2 oz (109 kg)  Height: 5' 6"  (1.676 m)   Body mass index is 38.77 kg/m.  Physical Exam  Constitutional: She is oriented to person, place, and time. She appears well-developed and well-nourished. No distress.  Obese black female    HENT:  Head: Normocephalic and atraumatic.  Right Ear: External ear normal.  Left Ear: External ear normal.  Nose: Nose normal.  Mouth/Throat: Oropharynx is clear and moist.  Eyes: Conjunctivae and EOM are normal. Pupils are equal, round, and reactive to light.  Proptosis of eyes  Neck: Normal range of motion. Neck supple.  Cardiovascular: Normal rate, regular rhythm, normal heart sounds and intact distal pulses.   Pulmonary/Chest: Effort normal and breath sounds normal. No respiratory distress.  Abdominal: Soft. Normal appearance and bowel sounds are normal. She exhibits no distension. There is no tenderness. There is no rebound and no CVA tenderness.  Musculoskeletal: She exhibits no tenderness.  Neurological: She is alert and oriented to person, place, and time.  Skin: Skin is warm and dry.  Psychiatric: She has a normal mood and affect.    Labs reviewed: Basic Metabolic Panel:  Recent Labs  05/08/15 1106  07/26/15 0949 07/26/15 1001 11/11/15 1115 12/22/15 2338  NA 138  < > 137 CANCELED 139 139  K 4.2  --  4.2 CANCELED 4.2 3.6  CL 102  --  110 CANCELED 109 101  CO2 19  --  19* CANCELED 22  --   GLUCOSE 218*  < > 203* CANCELED 252* 311*  BUN 14  < > 14 CANCELED 13 14  CREATININE 1.10*  < > 1.05 CANCELED 1.03 0.90  CALCIUM 9.8  --  9.1 CANCELED 9.1  --   TSH 0.711  --   --   --   --   --   < > = values in this interval not displayed. Liver Function Tests:  Recent Labs  07/26/15 0949 07/26/15 1001 11/11/15 1115  AST 8* CANCELED 8*  ALT 11 CANCELED 9  ALKPHOS 62 CANCELED 62  BILITOT 0.3 CANCELED 0.2  PROT 6.9 CANCELED 6.6  ALBUMIN 3.8 CANCELED 3.6   No results for input(s): LIPASE, AMYLASE in the last 8760 hours. No results for input(s): AMMONIA in the last 8760 hours. CBC:  Recent Labs  05/08/15 1106 12/22/15 2338  WBC 7.7  --   NEUTROABS 5.5  --   HGB  --  13.3  HCT 39.5 39.0  MCV 91  --   PLT 274  --    Lipid Panel:  Recent Labs   05/08/15 1106  CHOL 167  HDL 48  LDLCALC 99  TRIG 98  CHOLHDL 3.5   TSH:  Recent Labs  05/08/15 1106  TSH 0.711   A1C: Lab Results  Component Value Date   HGBA1C 7.5 (H) 11/11/2015     Assessment/Plan 1. Uncontrolled type 2 diabetes mellitus with diabetic nephropathy, unspecified long term insulin use status ([place]) Working on diet modification, cont current medication - Insulin Lispro (HUMALOG KWIKPEN) 200 UNIT/ML SOPN; Inject 5 Units into the skin 3 (three) times daily with meals.  Dispense: 15 pen; Refill: 3 - Hemoglobin A1c; Future  2. Essential hypertension Blood pressure controlled, conts on verapamil, lisinopril, hydralazine - CBC with Differential/Platelets; Future  3. MYOCARDIAL INFARCTION, HX OF No noted chest pains, conts on plavix. -will follow up lipids, LDL goal <70 and  proper blood pressure control.   4. OSA (obstructive sleep apnea) conts with cpap and follow up with pulmonary.   5. Gastroesophageal reflux disease without esophagitis Controlled on protonix and zantac, to stop naproxen and NSAIDs which cause worsening of GERD/gastritis.   6. Anxiety and depression In remission.    7. BLINDNESS, LEGAL, [place] DEFINITION Following with ophthalmology  8. Mixed hyperlipidemia Goal LDL <70 working on diet, conts on zocor - CMP with eGFR; Future - Lipid panel; Future  9. Seizures ([place]) No recent seizures, conts on topamax which also has benefit to migraines.  10. Chronic back pain -to stop naproxen and advil, to use tylenol PRN Cont gabapentin  -to follow up with pain clinic if pain persisted.    Carlos American. Harle Battiest  [facility] [phone] 8 am - 5 pm) [phone] (after hours)

## 2016-04-13 NOTE — Patient Instructions (Addendum)
To follow up this week or next for fasting blood work  Follow up in 3 months for routine follow up.   STOP NAPROXEN Use tylenol 500 mg 2 tablets every 8 hours as needed for pain  NSAIDS will make stomach issues worse.  (Aleve, Advil, Motrin, Ibuprofen)   To follow up with pain clinic if pain worsens

## 2016-04-16 ENCOUNTER — Other Ambulatory Visit: Payer: Self-pay | Admitting: Nurse Practitioner

## 2016-04-16 DIAGNOSIS — E876 Hypokalemia: Secondary | ICD-10-CM

## 2016-05-01 ENCOUNTER — Other Ambulatory Visit: Payer: Medicare Other

## 2016-05-05 ENCOUNTER — Ambulatory Visit: Payer: Medicare Other

## 2016-05-05 ENCOUNTER — Other Ambulatory Visit: Payer: Medicare Other

## 2016-05-06 ENCOUNTER — Other Ambulatory Visit: Payer: Medicare Other

## 2016-05-06 DIAGNOSIS — E1165 Type 2 diabetes mellitus with hyperglycemia: Secondary | ICD-10-CM

## 2016-05-06 DIAGNOSIS — E782 Mixed hyperlipidemia: Secondary | ICD-10-CM

## 2016-05-06 DIAGNOSIS — IMO0002 Reserved for concepts with insufficient information to code with codable children: Secondary | ICD-10-CM

## 2016-05-06 DIAGNOSIS — I10 Essential (primary) hypertension: Secondary | ICD-10-CM

## 2016-05-06 DIAGNOSIS — E1121 Type 2 diabetes mellitus with diabetic nephropathy: Secondary | ICD-10-CM

## 2016-05-06 LAB — COMPLETE METABOLIC PANEL WITH GFR
ALT: 12 U/L (ref 6–29)
AST: 9 U/L — ABNORMAL LOW (ref 10–35)
Albumin: 3.9 g/dL (ref 3.6–5.1)
Alkaline Phosphatase: 90 U/L (ref 33–130)
BUN: 14 mg/dL (ref 7–25)
CO2: 20 mmol/L (ref 20–31)
Calcium: 9.1 mg/dL (ref 8.6–10.4)
Chloride: 109 mmol/L (ref 98–110)
Creat: 0.86 mg/dL (ref 0.50–1.05)
GFR, Est African American: >89 mL/min (ref >=60)
GFR, Est Non African American: 78 mL/min (ref >=60)
Glucose, Bld: 251 mg/dL — ABNORMAL HIGH (ref 65–99)
Potassium: 4.2 mmol/L (ref 3.5–5.3)
Sodium: 138 mmol/L (ref 135–146)
Total Bilirubin: 0.2 mg/dL (ref 0.2–1.2)
Total Protein: 7.3 g/dL (ref 6.1–8.1)

## 2016-05-06 LAB — CBC WITH DIFFERENTIAL/PLATELET
Basophils Absolute: 0 cells/uL (ref 0–200)
Basophils Relative: 0 %
Eosinophils Absolute: 64 cells/uL (ref 15–500)
Eosinophils Relative: 1 %
HCT: 37.6 % (ref 35.0–45.0)
Hemoglobin: 12.1 g/dL (ref 11.7–15.5)
Lymphocytes Relative: 26 %
Lymphs Abs: 1664 cells/uL (ref 850–3900)
MCH: 29 pg (ref 27.0–33.0)
MCHC: 32.2 g/dL (ref 32.0–36.0)
MCV: 90.2 fL (ref 80.0–100.0)
MPV: 11.6 fL (ref 7.5–12.5)
Monocytes Absolute: 512 cells/uL (ref 200–950)
Monocytes Relative: 8 %
Neutro Abs: 4160 cells/uL (ref 1500–7800)
Neutrophils Relative %: 65 %
Platelets: 268 10*3/uL (ref 140–400)
RBC: 4.17 MIL/uL (ref 3.80–5.10)
RDW: 14 % (ref 11.0–15.0)
WBC: 6.4 10*3/uL (ref 3.8–10.8)

## 2016-05-06 LAB — LIPID PANEL
Cholesterol: 182 mg/dL (ref <200)
HDL: 46 mg/dL — ABNORMAL LOW (ref >50)
LDL Cholesterol: 112 mg/dL — ABNORMAL HIGH (ref <100)
Total CHOL/HDL Ratio: 4 Ratio (ref <5.0)
Triglycerides: 122 mg/dL (ref <150)
VLDL: 24 mg/dL (ref <30)

## 2016-05-07 ENCOUNTER — Ambulatory Visit (INDEPENDENT_AMBULATORY_CARE_PROVIDER_SITE_OTHER): Payer: Medicare Other

## 2016-05-07 ENCOUNTER — Telehealth: Payer: Self-pay

## 2016-05-07 VITALS — BP 138/80 | HR 87 | Temp 98.4°F | Ht 66.0 in | Wt 240.0 lb

## 2016-05-07 DIAGNOSIS — Z0000 Encounter for general adult medical examination without abnormal findings: Secondary | ICD-10-CM | POA: Diagnosis not present

## 2016-05-07 DIAGNOSIS — E119 Type 2 diabetes mellitus without complications: Secondary | ICD-10-CM | POA: Diagnosis not present

## 2016-05-07 LAB — HEMOGLOBIN A1C
Hgb A1c MFr Bld: 10 % — ABNORMAL HIGH (ref <5.7)
Mean Plasma Glucose: 240 mg/dL

## 2016-05-07 MED ORDER — INSULIN LISPRO 200 UNIT/ML ~~LOC~~ SOPN: 5.0000 [IU] | PEN_INJECTOR | Freq: Three times a day (TID) | SUBCUTANEOUS | 3 refills | Status: DC

## 2016-05-07 NOTE — Telephone Encounter (Signed)
Patient called to inform us that she went to [facility] on [address] and they never received rx that we sent today. Patient is [facility]  [facility], spoke with Minette Brine and she said rx was received, the insurance is rejecting with the indication rx requested too soon.   Olivia recommended that I have the patient call and provide information as to where rx was previously filled for it is not showing that it was filled [facility] a [facility], if patient can provide this information its possible she can dispense to patient.   I called patient and informed her of the conversation with [facility]. [facility]

## 2016-05-07 NOTE — Progress Notes (Signed)
Subjective:   [name] is a 51 y.o. female who presents for an Initial Medicare Annual Wellness Visit.  Cardiac Risk Factors include: diabetes mellitus;dyslipidemia;family history of premature cardiovascular disease;hypertension;obesity (BMI >30kg/m2)     Objective:    Today's Vitals   05/07/16 1009  BP: 138/80  Pulse: 87  Temp: 98.4 F (36.9 C)  TempSrc: Oral  SpO2: 98%  Weight: 240 lb (108.9 kg)  Height: 5' 6"  (1.676 m)  PainSc: 10-Worst pain ever   Body mass index is 38.74 kg/m.   Current Medications (verified) Outpatient Encounter Prescriptions as of 05/07/2016  Medication Sig  . albuterol (PROAIR HFA) 108 (90 Base) MCG/ACT inhaler INHALE 2 PUFFS BY MOUTH EVERY 6 HOURS AS NEEDED FOR WHEEZING OR SHORTNESS OF BREATH  . AMBULATORY NON FORMULARY MEDICATION Medication Name: Muscle and Joint Vanishing Scent Gel- Apply as needed to skin  . baclofen (LIORESAL) 10 MG tablet TAKE 1 TABLET BY MOUTH 3 TIMES A DAY AS NEEDED FOR MUSCLE SPASMS  . budesonide-formoterol (SYMBICORT) 160-4.5 MCG/ACT inhaler Inhale 2 puffs into the lungs daily.  . Calcium Carbonate-Vitamin D (CALCIUM-VITAMIN D3 PO) D3 500- calcium 630 mg Take 1 capsule by mouth once daily  . clopidogrel (PLAVIX) 75 MG tablet Take 1 tablet (75 mg total) by mouth daily.  . cycloSPORINE (RESTASIS) 0.05 % ophthalmic emulsion Place 2 drops into both eyes 2 (two) times daily.  . fluorometholone (FML) 0.1 % ophthalmic suspension Place 2 drops into both eyes 2 (two) times daily.  . fluticasone (FLONASE) 50 MCG/ACT nasal spray Place 2 sprays into both nostrils daily.  [name] gabapentin (NEURONTIN) 300 MG capsule TAKE 2 CAPSULES BY MOUTH 3  TIMES DAILY  . glucose blood (ONETOUCH VERIO) test strip 1 each by Other route 3 (three) times daily. E11.29  . hydrALAZINE (APRESOLINE) 25 MG tablet Take 1 tablet (25 mg total) by mouth 3 (three) times daily.  . Insulin Glargine (TOUJEO SOLOSTAR) 300 UNIT/ML SOPN Inject 86 Units into the  skin daily. Reported on 07/18/2015  . Insulin Lispro (HUMALOG KWIKPEN) 200 UNIT/ML SOPN Inject 5 Units into the skin 3 (three) times daily with meals.  [name] lisinopril (PRINIVIL,ZESTRIL) 20 MG tablet Take 1 tablet (20 mg total) by mouth daily.  . mirabegron ER (MYRBETRIQ) 50 MG TB24 tablet Take 1 tablet (50 mg total) by mouth daily.  . ondansetron (ZOFRAN-ODT) 4 MG disintegrating tablet TAKE 1 TABLET BY MOUTH EVERY 8 HOURS AS NEEDED FOR NAUSEA OR VOMITING  . pantoprazole (PROTONIX) 40 MG tablet Take 1 tablet (40 mg total) by mouth daily.  . polyethylene glycol (MIRALAX / GLYCOLAX) packet Take 17 g by mouth daily as needed for mild constipation.  . potassium chloride SA (K-DUR,KLOR-CON) 20 MEQ tablet TAKE 2 TABLETS BY MOUTH  DAILY  . ranitidine (ZANTAC) 150 MG tablet Take 1 tablet (150 mg total) by mouth 2 (two) times daily.  . simvastatin (ZOCOR) 20 MG tablet TAKE 1 TABLET BY MOUTH  EVERY MORNING FOR  CHOLESTEROL  . tamsulosin (FLOMAX) 0.4 MG CAPS capsule Take 1 capsule (0.4 mg total) by mouth daily.  [name] topiramate (TOPAMAX) 200 MG tablet Take 1 tablet (200 mg total) by mouth 2 (two) times daily.  . verapamil (VERELAN PM) 240 MG 24 hr capsule TAKE ONE CAPSULE BY MOUTH EVERY NIGHT AT BEDTIME FOR BLOOD PRESSURE  . [DISCONTINUED] Insulin Lispro (HUMALOG KWIKPEN) 200 UNIT/ML SOPN Inject 5 Units into the skin 3 (three) times daily with meals.  . [DISCONTINUED] ibuprofen (ADVIL,MOTRIN) 600 MG tablet Take  1 tablet (600 mg total) by mouth every 8 (eight) hours as needed. for pain   No facility-administered encounter medications on file as of 05/07/2016.     Allergies (verified) Codeine and Lactose intolerance (gi)   History: Past Medical History:  Diagnosis Date  . Anxiety   . Arthritis    joint pain   . Asthma   . COPD (chronic obstructive pulmonary disease) (HCC)    chronic bronchitis   . CVA 11/14/2007   Qualifier: Diagnosis of  By: Hassell Done [name], Tori Milks    . Depression   . Diabetes mellitus      since age 74; type 2 IDDM  . Diabetic neuropathy, painful ([place])    FEET AND HANDS  . Dilated aortic root ([place])    33m by echo 11/2015  . Dizziness    secondary to diabetes and hypertension   . Dysrhythmia    skipped beats  . Epilepsy idiopathic petit mal ([place]    last seizure 2012;controlled w/ topomax  . GERD (gastroesophageal reflux disease)   . Glaucoma    NOT ON ANY EYE DROPS   . Headache(784.0)    migraines   . Heart murmur    born with   . Hx of blood clots    hematomas removed from left side of brain from 116moto 5y80yrld   . Hyperlipidemia   . Hypertension   . Legally blind    left eye   . MIGRAINE HEADACHE 09/14/2006   Qualifier: Diagnosis of  By: MarHassell DoneP, NykTori Milks . [place]  [place] of x 2  last one 2003   . Nephrolithiasis    frequent urination , urination at nite  PT SEEN IN ER 09/22/11 FOR BACK AND RT SIDED PAIN--HAS KNOWN STONE RT URETER AND UA IN ER SHOWED UTI  . Neuropathy    due to diabetes   . Pain 09/23/11   LOWER BACK AND RIGHT SIDE--PT HAS RIGHT URETERAL STONE  . Pneumonia    hx of 2009  . Pyelonephritis   . [place] . [facility] ap[facility]    [facility] ; mild  . Stress incontinence   . [facility]  [facility] 2003  RESIDUAL LEFT LEG WEAKNESS--NO OTHER RESIDUAL PROBLEMS   Past Surgical History:  Procedure Laterality Date  . BRAIN HEMATOMA EVACUATION     five procedures total, first procedure when 10 41 monthsd, last at 4 y80ars of age.  . CYSTOSCOPY W/ RETROGRADES  09/24/2011   Procedure: CYSTOSCOPY WITH RETROGRADE PYELOGRAM;  Surgeon: JohMalka SoD;  Location: [facility];  Service: Urology;  Laterality: Right;  . CYSTOSCOPY WITH URETEROSCOPY  09/24/2011   Procedure: CYSTOSCOPY WITH URETEROSCOPY;  Surgeon: JohMalka SoD;  Location: [facility];  Service: Urology;  Laterality: Right;  Balloon dilation right ureter   . CYSTOSCOPY/RETROGRADE/URETEROSCOPY  08/24/2011   Procedure:  CYSTOSCOPY/RETROGRADE/URETEROSCOPY;  Surgeon: DanMolli HazardD;  Location: [facility];  Service: Urology;  Laterality: Right;  Cysto, Right retrograde Pyelogram, right stent placement.   . IFatima BlankRNIA REPAIR  05/05/2011   Procedure: LAPAROSCOPIC INCISIONAL HERNIA;  Surgeon: MatRolm BookbinderD;  [facility];  Laterality: N/A;  . kidney stone removal    . MASS EXCISION  05/05/2011   Procedure: EXCISION MASS;  Surgeon: MatRolm BookbinderD;  [facility];  Laterality: Right;  . PCNL    .  RETINAL DETACHMENT SURGERY Left 1990  . SHUNT REMOVAL     shunt inserted at age 18 removed at age 84   . VAGINAL HYSTERECTOMY  1996   Family History  Problem Relation Age of Onset  . Uterine cancer Mother   . Hypertension Mother   . Brain cancer Maternal Grandmother   . Hypertension Maternal Grandmother   . Cirrhosis Maternal Grandfather   . Anesthesia problems Neg Hx   . Colon cancer Neg Hx   . Esophageal cancer Neg Hx   . Pancreatic cancer Neg Hx    Social History   Occupational History  . Not on file.   Social History Main Topics  . Smoking status: Former Smoker    Packs/day: 1.00    Years: 15.00    Types: Cigarettes    Quit date: 01/20/1987  . Smokeless tobacco: Never Used     Comment: quit more than 20years  . Alcohol use 0.0 oz/week     Comment: 1 glass weekly  . Drug use: No     Comment: hx of marijuana use no longer uses   . Sexual activity: Not Currently    Birth control/ protection: Surgical    Tobacco Counseling Counseling given: Not Answered   Activities of Daily Living In your present state of health, do you have any difficulty performing the following activities: 05/07/2016 08/06/2015  Hearing? N N  Vision? Y Y  Difficulty concentrating or making decisions? N N  Walking or climbing stairs? N Y  Dressing or bathing? N N  Doing errands, shopping? Tempie Donning  Preparing Food and eating ? N -  Using the Toilet? N -  In the  past six months, have you accidently leaked urine? Y -  Do you have problems with loss of bowel control? N -  Managing your Medications? Y -  Managing your Finances? Y -  Housekeeping or managing your Housekeeping? Y -  Some recent data might be hidden    Immunizations and Health Maintenance Immunization History  Administered Date(s) Administered  . Influenza Split 10/07/2012  . Influenza Whole 11/20/2006  . Influenza,inj,Quad PF,36+ Mos 10/30/2013, 10/19/2014  . Pneumococcal Conjugate-13 04/29/2015  . Pneumococcal Polysaccharide-23 11/20/2006, 09/25/2011   Health Maintenance Due  Topic Date Due  . TETANUS/TDAP  09/22/1983  . COLONOSCOPY  09/22/2014    Patient Care Team: Lauree Chandler, NP as PCP - General (Nurse Practitioner) My Margret Chance, OD as Referring Physician (Optometry)  Indicate any recent Medical Services you may have received from other than [facility] providers in the past year (date may be approximate).     Assessment:   This is a routine wellness examination for Demesha.  Hearing/Vision screen No exam data present  Dietary issues and exercise activities discussed: Current Exercise Habits: Home exercise routine, Type of exercise: Other - see comments;strength training/weights (bike), Time (Minutes): 60, Frequency (Times/Week): 3, Weekly Exercise (Minutes/Week): 180, Intensity: Moderate, Exercise limited by: None identified  Goals    . Weight (lb) < 220 lb (99.8 kg)          Starting 05/07/2016 I will start going to a diabetes counselor to help lose extra weight.      Depression Screen PHQ 2/9 Scores 05/07/2016 08/06/2015 01/08/2015 10/09/2014 09/03/2014 12/25/2013 06/07/2013  PHQ - 2 Score 0 0 0 0 5 0 1  PHQ- 9 Score - - - - 19 - -    Fall Risk Fall Risk  05/07/2016 04/13/2016 12/24/2015 08/06/2015 07/29/2015  Falls in the past  year? No Yes No No No  Number falls in past yr: - 1 - - -  Injury with Fall? - No - - -  Risk for fall due to : - - - - -  Risk for fall  due to (comments): - - - - -  Follow up - - - - -    Cognitive Function:        Screening Tests Health Maintenance  Topic Date Due  . TETANUS/TDAP  09/22/1983  . COLONOSCOPY  09/22/2014  . INFLUENZA VACCINE  04/16/2017 (Originally 08/19/2016)  . PNEUMOCOCCAL POLYSACCHARIDE VACCINE (2) 08/19/2023 (Originally 11/20/2011)  . OPHTHALMOLOGY EXAM  05/22/2016  . FOOT EXAM  08/05/2016  . HEMOGLOBIN A1C  11/05/2016  . MAMMOGRAM  08/12/2017  . PAP SMEAR  08/06/2018  . HIV Screening  Completed      Plan:  I have personally reviewed and addressed the Medicare Annual Wellness questionnaire and have noted the following in the patient's chart:  A. Medical and social history B. Use of alcohol, tobacco or illicit drugs  C. Current medications and supplements D. Functional ability and status E.  Nutritional status F.  Physical activity G. Advance directives H. List of other physicians I.  Hospitalizations, surgeries, and ER visits in previous 12 months J.  [place] to include hearing, vision, cognitive, depression L. Referrals and appointments - none  In addition, I have reviewed and discussed with patient certain preventive protocols, quality metrics, and best practice recommendations. A written personalized care plan for preventive services as well as general preventive health recommendations were provided to patient.  See attached scanned questionnaire for additional information.   Signed,   Rich Reining, RN Nurse Health Advisor  I reviewed health advisors note, was available for consultation and agree with documentation and plan.  Carlos American. Harle Battiest  [facility] [phone] 8 am - 5 pm) [phone] (after hours)

## 2016-05-07 NOTE — Patient Instructions (Addendum)
[name] , Thank you for taking time to come for your Medicare Wellness Visit. I appreciate your ongoing commitment to your health goals. Please review the following plan we discussed and let me know if I can assist you in the future.   Screening recommendations/referrals: Colonoscopy/Colorguard due in 2 years.  Mammogram up to date. Due 08/18/2017 Bone Density Due at age 51 Recommended yearly ophthalmology/optometry visit for glaucoma screening and checkup Recommended yearly dental visit for hygiene and checkup  Vaccinations: Influenza vaccine up to date Pneumococcal vaccine up to date Tdap vaccine due. If you decide you want this give [name] a call and we will put in a prescription Shingles vaccine due at age 1.  Advanced directives: Advance directive discussed with you today. Once this is complete please bring a copy in to our office so we can scan it into your chart.  Conditions/risks identified: Out of insulin, put in a prescription  Next appointment: Sherrie Mustache 6/25 @ 9:45am  Preventive Care 40-64 Years, Female Preventive care refers to lifestyle choices and visits with your health care provider that can promote health and wellness. What does preventive care include?  A yearly physical exam. This is also called an annual well check.  Dental exams once or twice a year.  Routine eye exams. Ask your health care provider how often you should have your eyes checked.  Personal lifestyle choices, including:  Daily care of your teeth and gums.  Regular physical activity.  Eating a healthy diet.  Avoiding tobacco and drug use.  Limiting alcohol use.  Practicing safe sex.  Taking low-dose aspirin daily starting at age 46.  Taking vitamin and mineral supplements as recommended by your health care provider. What happens during an annual well check? The services and screenings done by your health care provider during your annual well check will depend on your age,  overall health, lifestyle risk factors, and family history of disease. Counseling  Your health care provider may ask you questions about your:  Alcohol use.  Tobacco use.  Drug use.  Emotional well-being.  Home and relationship well-being.  Sexual activity.  Eating habits.  Work and work Statistician.  Method of birth control.  Menstrual cycle.  Pregnancy history. Screening  You may have the following tests or measurements:  Height, weight, and BMI.  Blood pressure.  Lipid and cholesterol levels. These may be checked every 5 years, or more frequently if you are over 52 years old.  Skin check.  Lung cancer screening. You may have this screening every year starting at age 62 if you have a 30-pack-year history of smoking and currently smoke or have quit within the past 15 years.  Fecal occult blood test (FOBT) of the stool. You may have this test every year starting at age 94.  Flexible sigmoidoscopy or colonoscopy. You may have a sigmoidoscopy every 5 years or a colonoscopy every 10 years starting at age 65.  Hepatitis C blood test.  Hepatitis B blood test.  Sexually transmitted disease (STD) testing.  Diabetes screening. This is done by checking your blood sugar (glucose) after you have not eaten for a while (fasting). You may have this done every 1-3 years.  Mammogram. This may be done every 1-2 years. Talk to your health care provider about when you should start having regular mammograms. This may depend on whether you have a family history of breast cancer.  BRCA-related cancer screening. This may be done if you have a family history of breast, ovarian,  tubal, or peritoneal cancers.  Pelvic exam and Pap test. This may be done every 3 years starting at age 61. Starting at age 29, this may be done every 5 years if you have a Pap test in combination with an HPV test.  Bone density scan. This is done to screen for osteoporosis. You may have this scan if you are at  high risk for osteoporosis. Discuss your test results, treatment options, and if necessary, the need for more tests with your health care provider. Vaccines  Your health care provider may recommend certain vaccines, such as:  Influenza vaccine. This is recommended every year.  Tetanus, diphtheria, and acellular pertussis (Tdap, Td) vaccine. You may need a Td booster every 10 years.  Zoster vaccine. You may need this after age 12.  Pneumococcal 13-valent conjugate (PCV13) vaccine. You may need this if you have certain conditions and were not previously vaccinated.  Pneumococcal polysaccharide (PPSV23) vaccine. You may need one or two doses if you smoke cigarettes or if you have certain conditions. Talk to your health care provider about which screenings and vaccines you need and how often you need them. This information is not intended to replace advice given to you by your health care provider. Make sure you discuss any questions you have with your health care provider. Document Released: 02/01/2015 Document Revised: 09/25/2015 Document Reviewed: 11/06/2014 Elsevier Interactive Patient Education  2017 [place] Prevention in the Home Falls can cause injuries. They can happen to people of all ages. There are many things you can do to make your home safe and to help prevent falls. What can I do on the outside of my home?  Regularly fix the edges of walkways and driveways and fix any cracks.  Remove anything that might make you trip as you walk through a door, such as a raised step or threshold.  Trim any bushes or trees on the path to your home.  Use bright outdoor lighting.  Clear any walking paths of anything that might make someone trip, such as rocks or tools.  Regularly check to see if handrails are loose or broken. Make sure that both sides of any steps have handrails.  Any raised decks and porches should have guardrails on the edges.  Have any leaves, snow, or  ice cleared regularly.  Use sand or salt on walking paths during winter.  Clean up any spills in your garage right away. This includes oil or grease spills. What can I do in the bathroom?  Use night lights.  Install grab bars by the toilet and in the tub and shower. Do not use towel bars as grab bars.  Use non-skid mats or decals in the tub or shower.  If you need to sit down in the shower, use a plastic, non-slip stool.  Keep the floor dry. Clean up any water that spills on the floor as soon as it happens.  Remove soap buildup in the tub or shower regularly.  Attach bath mats securely with double-sided non-slip rug tape.  Do not have throw rugs and other things on the floor that can make you trip. What can I do in the bedroom?  Use night lights.  Make sure that you have a light by your bed that is easy to reach.  Do not use any sheets or blankets that are too big for your bed. They should not hang down onto the floor.  Have a firm chair that has side arms.  You can use this for support while you get dressed.  Do not have throw rugs and other things on the floor that can make you trip. What can I do in the kitchen?  Clean up any spills right away.  Avoid walking on wet floors.  Keep items that you use a lot in easy-to-reach places.  If you need to reach something above you, use a strong step stool that has a grab bar.  Keep electrical cords out of the way.  Do not use floor polish or wax that makes floors slippery. If you must use wax, use non-skid floor wax.  Do not have throw rugs and other things on the floor that can make you trip. What can I do with my stairs?  Do not leave any items on the stairs.  Make sure that there are handrails on both sides of the stairs and use them. Fix handrails that are broken or loose. Make sure that handrails are as long as the stairways.  Check any carpeting to make sure that it is firmly attached to the stairs. Fix any carpet  that is loose or worn.  Avoid having throw rugs at the top or bottom of the stairs. If you do have throw rugs, attach them to the floor with carpet tape.  Make sure that you have a light switch at the top of the stairs and the bottom of the stairs. If you do not have them, ask someone to add them for you. What else can I do to help prevent falls?  Wear shoes that:  Do not have high heels.  Have rubber bottoms.  Are comfortable and fit you well.  Are closed at the toe. Do not wear sandals.  If you use a stepladder:  Make sure that it is fully opened. Do not climb a closed stepladder.  Make sure that both sides of the stepladder are locked into place.  Ask someone to hold it for you, if possible.  Clearly mark and make sure that you can see:  Any grab bars or handrails.  First and last steps.  Where the edge of each step is.  Use tools that help you move around (mobility aids) if they are needed. These include:  Canes.  Walkers.  Scooters.  Crutches.  Turn on the lights when you go into a dark area. Replace any light bulbs as soon as they burn out.  Set up your furniture so you have a clear path. Avoid moving your furniture around.  If any of your floors are uneven, fix them.  If there are any pets around you, be aware of where they are.  Review your medicines with your doctor. Some medicines can make you feel dizzy. This can increase your chance of falling. Ask your doctor what other things that you can do to help prevent falls. This information is not intended to replace advice given to you by your health care provider. Make sure you discuss any questions you have with your health care provider. Document Released: 11/01/2008 Document Revised: 06/13/2015 Document Reviewed: 02/09/2014 Elsevier Interactive Patient Education  2017 [employer].

## 2016-05-07 NOTE — Progress Notes (Signed)
Quick Notes   Health Maintenance: TDAP due.     Abnormal Screen: <65, no MMSE. 1st BP: 144/80. 2nd BP: 138/80   Patient Concerns: pt neeed prescription for lispro. Put it in. Neuropathic pain in left arm and right hand. Mirabegron is not working, she is still urinating on herself. Not sure if she needs an appointment to see you.   Nurse Concerns: Pt is doing all that she can with diet and exercise to lose weight, but I think she needs DM counseling. Putting in referral

## 2016-05-08 MED ORDER — SIMVASTATIN 40 MG PO TABS: 40.0000 mg | ORAL_TABLET | Freq: Every day | ORAL | 0 refills | Status: DC

## 2016-05-12 LAB — HM DIABETES EYE EXAM

## 2016-05-25 ENCOUNTER — Ambulatory Visit (INDEPENDENT_AMBULATORY_CARE_PROVIDER_SITE_OTHER): Payer: Medicare Other | Admitting: Pharmacotherapy

## 2016-05-25 ENCOUNTER — Encounter: Payer: Self-pay | Admitting: Pharmacotherapy

## 2016-05-25 VITALS — BP 144/86 | HR 83 | Ht 66.0 in | Wt 239.8 lb

## 2016-05-25 DIAGNOSIS — I10 Essential (primary) hypertension: Secondary | ICD-10-CM | POA: Diagnosis not present

## 2016-05-25 DIAGNOSIS — E119 Type 2 diabetes mellitus without complications: Secondary | ICD-10-CM | POA: Diagnosis not present

## 2016-05-25 MED ORDER — INSULIN LISPRO 200 UNIT/ML ~~LOC~~ SOPN: 8.0000 [IU] | PEN_INJECTOR | Freq: Three times a day (TID) | SUBCUTANEOUS | 3 refills | Status: DC

## 2016-05-25 NOTE — Progress Notes (Signed)
  Subjective:    [name] is a 51 y.o.African American female who presents for follow-up of Type 2 diabetes mellitus.   05/06/16 A1C was elevated at 10.0%.  Elevated from 7.6% 6 months ago. She has not been taking her insulin since March.  "insurance issues" and she moved.  She says insurance did not have her new address.  Average BG: 221m/dl No hypoglycemia.  She is back on Toujeo 86 units daily.  She says she has been taking for at least 2 weeks. She is taking her Humalog 5 units with each meal again. She says has been back on all of her medications for the last 2 weeks. She is trying to make healthy food choices. Walking for exercise.  Now has to take the stairs daily. Denies problems with vision Denies problems with feet Some peripheral edema. Nocturia 3-4 times per night. No dysuria. Staying well hydrated.    Review of Systems A comprehensive review of systems was negative except for: Eyes: positive for contacts/glasses Cardiovascular: positive for lower extremity edema Genitourinary: positive for nocturia    Objective:    BP (!) 144/86   Pulse 83   Ht 5' 6"  (1.676 m)   Wt 239 lb 12.8 oz (108.8 kg)   SpO2 98%   BMI 38.70 kg/m   General:  alert, cooperative and no distress  Oropharynx: normal findings: lips normal without lesions and gums healthy   Eyes:  negative findings: lids and lashes normal and conjunctivae and sclerae normal   Ears:  external ears normal        Lung: clear to auscultation bilaterally  Heart:  regular rate and rhythm     Extremities: edema bilateral lower extremities  Skin: dry     Neuro: mental status, speech normal, alert and oriented x3 and gait and station normal   Lab Review Glucose, Bld (mg/dL)  Date Value  05/06/2016 251 (H)  12/22/2015 311 (H)  11/11/2015 252 (H)   CO2 (mmol/L)  Date Value  05/06/2016 20  11/11/2015 22  07/26/2015 CANCELED   BUN (mg/dL)  Date Value  05/06/2016 14  12/22/2015 14   11/11/2015 13  05/08/2015 14  12/19/2014 17  09/04/2014 13   Creat (mg/dL)  Date Value  05/06/2016 0.86  11/11/2015 1.03  07/26/2015 CANCELED   Creatinine, Ser (mg/dL)  Date Value  12/22/2015 0.90       Assessment:    Diabetes Mellitus type II, under poor control.   BP above goal <130/80   Plan:    1.  Rx changes: Increase Humalog to 8 units with each meal.  2.  Continue Toujeo 86 units daily. 3.  Counseled on importance of taking insulin as prescribed. 4.  Counseled on complications of uncontrolled DM 5.  Counseled on nutrition goals 6.  Exercise goal is 30-45 minutes 5 x week. 7.  BP above goal today - missed medication? 8.  RTC in 4-6 weeks

## 2016-05-25 NOTE — Patient Instructions (Signed)
Increase Humalog to 8 units with each main meal.

## 2016-05-29 ENCOUNTER — Telehealth: Payer: Self-pay

## 2016-05-29 NOTE — Telephone Encounter (Signed)
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 She said she is not interested in switching pharmacies. She uses [facility]. She also informed me that she is having a lot of problems with [facility] calling her and trying to get her to switch. She has told them several times she is not interested. Please shred any request received from this pharmacy.

## 2016-05-29 NOTE — Telephone Encounter (Signed)
Called Mrs. Herbin to see if she is ordering diabetic supplies from [facility]. She was unavailable so I left a message for her to call the office.

## 2016-06-16 ENCOUNTER — Telehealth: Payer: Self-pay | Admitting: Pulmonary Disease

## 2016-06-16 NOTE — Telephone Encounter (Signed)
lmtcb x1 for pt. 

## 2016-06-17 NOTE — Telephone Encounter (Signed)
Patient wanted [name] to be aware she has a new address since supplies were delivered last time.  Patient's address is [address], [address], [place], [place] [address].  CB is [phone].

## 2016-06-17 NOTE — Telephone Encounter (Signed)
ATC patient but did not receive a dial tone. Will try again later.

## 2016-06-17 NOTE — Telephone Encounter (Signed)
Patient returning call - she can be received at [phone] -pr

## 2016-06-17 NOTE — Telephone Encounter (Signed)
Patient in the lobby - she is needing to speak to nurse about having rx for cpap supplies sent to [facility] for lifetime -pr

## 2016-06-17 NOTE — Telephone Encounter (Signed)
Spoke to patient in office. Explained to patient that she has not been seen since May 2017. Scheduled with TP for 06/22/16 at 9am for 8yrfollow up. Pt states she has not been using machine recently due to her needing a new mask.

## 2016-06-17 NOTE — Telephone Encounter (Signed)
LMTCB

## 2016-06-22 ENCOUNTER — Encounter: Payer: Self-pay | Admitting: Adult Health

## 2016-06-22 ENCOUNTER — Ambulatory Visit (INDEPENDENT_AMBULATORY_CARE_PROVIDER_SITE_OTHER): Payer: Medicare Other | Admitting: Adult Health

## 2016-06-22 ENCOUNTER — Ambulatory Visit (INDEPENDENT_AMBULATORY_CARE_PROVIDER_SITE_OTHER)
Admission: RE | Admit: 2016-06-22 | Discharge: 2016-06-22 | Disposition: A | Discharge status: Home / Self Care | Payer: Medicare Other | Source: Ambulatory Visit | Attending: Adult Health | Admitting: Adult Health

## 2016-06-22 DIAGNOSIS — G4733 Obstructive sleep apnea (adult) (pediatric): Secondary | ICD-10-CM

## 2016-06-22 DIAGNOSIS — J45909 Unspecified asthma, uncomplicated: Secondary | ICD-10-CM | POA: Insufficient documentation

## 2016-06-22 DIAGNOSIS — J4530 Mild persistent asthma, uncomplicated: Secondary | ICD-10-CM | POA: Diagnosis not present

## 2016-06-22 DIAGNOSIS — J449 Chronic obstructive pulmonary disease, unspecified: Secondary | ICD-10-CM | POA: Insufficient documentation

## 2016-06-22 NOTE — Assessment & Plan Note (Signed)
Good control , still has a few events and avg pressure is at 12 . Will adjust slightly to see if better control  Change to 5 to 15cm H2o . Check download in 1 month .   Cont on CPAP At bedtime   Supplies refills.  Wt loss

## 2016-06-22 NOTE — Progress Notes (Signed)
@Patient  ID: [name], female    DOB: [birth year], 51 y.o.   MRN: [id number]  Chief Complaint  Patient presents with  . Follow-up    OSA    Referring provider: Lauree Chandler, NP  HPI: 51 year old female former smoker smoker, retired Therapist, sports, followed for mild sleep apnea and asthma/chronic bronchitis    TEST  PSG 02/2013 -wt - 245 pounds -total sleep time 349 minutes, AHI 6 per hour, lowest desat 82% PFTs 06/2013 -moderate restriction, ratio normal   06/22/2016 Follow up : OSA /Asthma  Patient returns for one-year follow-up. She says she's been doing good with C Pap machine. She wears it every night. She does say that her nasal pillows broke 3 days ago he needs new supplies. She feels rested. She is getting about 5-6 hours each night. Download shows excellent compliance with average usage around 5.5 hours. She is on AutoSet 4-12 cm H2O. AHI 4.1. Minimal leaks. Average pressure 12.  Patient has mild asthma. She take Symbicort 2 puffs once daily. Patient says her symptoms been under good control. She denies flare cough or wheezing. Has rare use of Provera. Patient is on ACE inhibitor.  Complains of dry cough most days . Taking cough syrup most days . Cough worse at night .  Denies fever, discolored mucus , drainage, gerd .    Allergies  Allergen Reactions  . Codeine Swelling and Other (See Comments)    Swelling and burning of mouth (inside)  . Lactose Intolerance (Gi) Nausea And Vomiting    Immunization History  Administered Date(s) Administered  . Influenza Split 10/07/2012  . Influenza Whole 11/20/2006  . Influenza,inj,Quad PF,36+ Mos 10/30/2013, 10/19/2014  . Pneumococcal Conjugate-13 04/29/2015  . Pneumococcal Polysaccharide-23 11/20/2006, 09/25/2011    Past Medical History:  Diagnosis Date  . Anxiety   . Arthritis    joint pain   . Asthma   . COPD (chronic obstructive pulmonary disease) (HCC)    chronic bronchitis   . CVA 11/14/2007   Qualifier:  Diagnosis of  By: Hassell Done [name], Tori Milks    . Depression   . Diabetes mellitus    since age 45; type 2 IDDM  . Diabetic neuropathy, painful ([facility])    FEET AND HANDS  . Dilated aortic root ([facility])    25m by echo 11/2015  . Dizziness    secondary to diabetes and hypertension   . Dysrhythmia    skipped beats  . Epilepsy idiopathic petit mal ([place]    last seizure 2012;controlled w/ topomax  . GERD (gastroesophageal reflux disease)   . Glaucoma    NOT ON ANY EYE DROPS   . Headache(784.0)    migraines   . Heart murmur    born with   . Hx of blood clots    hematomas removed from left side of brain from [id number]   . Hyperlipidemia   . Hypertension   . Legally blind    left eye   . MIGRAINE HEADACHE 09/14/2006   Qualifier: Diagnosis of  By: MarHassell DoneP, NykTori Milks . [place]ocardial infarction ([facility]  hi of x 2  last one 2003   . Nephrolithiasis    frequent urination , urination at nite  PT SEEN IN ER 09/22/11 FOR BACK AND RT SIDED PAIN--HAS KNOWN STONE RT URETER AND UA IN ER SHOWED UTI  . Neuropathy    due to diabetes   . Pain 09/23/11   LOWER BACK AND RIGHT SIDE--PT HAS  RIGHT URETERAL STONE  . Pneumonia    hx of 2009  . Pyelonephritis   . Seizures ([place])   . [facility] ap[facility]    [facility] not [facility] ; mild  . Stress incontinence   . Stroke [facility])    last 2003  RESIDUAL LEFT LEG WEAKNESS--NO OTHER RESIDUAL PROBLEMS    Tobacco History: History  Smoking Status  . Former Smoker  . Packs/day: 1.00  . Years: 15.00  . Types: Cigarettes  . Quit date: 01/20/1987  Smokeless Tobacco  . Never Used    Comment: quit more than 20years   Counseling given: Not Answered   Outpatient Encounter Prescriptions as of 06/22/2016  Medication Sig  . albuterol (PROAIR HFA) 108 (90 Base) MCG/ACT inhaler INHALE 2 PUFFS BY MOUTH EVERY 6 HOURS AS NEEDED FOR WHEEZING OR SHORTNESS OF BREATH  . AMBULATORY NON FORMULARY MEDICATION Medication Name: Muscle and Joint Vanishing Scent  Gel- Apply as needed to skin  . baclofen (LIORESAL) 10 MG tablet TAKE 1 TABLET BY MOUTH 3 TIMES A DAY AS NEEDED FOR MUSCLE SPASMS  . budesonide-formoterol (SYMBICORT) 160-4.5 MCG/ACT inhaler Inhale 2 puffs into the lungs daily.  . Calcium Carbonate-Vitamin D (CALCIUM-VITAMIN D3 PO) D3 500- calcium 630 mg Take 1 capsule by mouth once daily  . clopidogrel (PLAVIX) 75 MG tablet Take 1 tablet (75 mg total) by mouth daily.  . cycloSPORINE (RESTASIS) 0.05 % ophthalmic emulsion Place 2 drops into both eyes 2 (two) times daily.  . fluorometholone (FML) 0.1 % ophthalmic suspension Place 2 drops into both eyes 2 (two) times daily.  . fluticasone (FLONASE) 50 MCG/ACT nasal spray Place 2 sprays into both nostrils daily.  [name] gabapentin (NEURONTIN) 300 MG capsule TAKE 2 CAPSULES BY MOUTH 3  TIMES DAILY  . glucose blood (ONETOUCH VERIO) test strip 1 each by Other route 3 (three) times daily. E11.29  . hydrALAZINE (APRESOLINE) 25 MG tablet Take 1 tablet (25 mg total) by mouth 3 (three) times daily.  . Insulin Glargine (TOUJEO SOLOSTAR) 300 UNIT/ML SOPN Inject 86 Units into the skin daily. Reported on 07/18/2015  . Insulin Lispro (HUMALOG KWIKPEN) 200 UNIT/ML SOPN Inject 8 Units into the skin 3 (three) times daily with meals.  [name] lisinopril (PRINIVIL,ZESTRIL) 20 MG tablet Take 1 tablet (20 mg total) by mouth daily.  . mirabegron ER (MYRBETRIQ) 50 MG TB24 tablet Take 1 tablet (50 mg total) by mouth daily.  . ondansetron (ZOFRAN-ODT) 4 MG disintegrating tablet TAKE 1 TABLET BY MOUTH EVERY 8 HOURS AS NEEDED FOR NAUSEA OR VOMITING  . pantoprazole (PROTONIX) 40 MG tablet Take 1 tablet (40 mg total) by mouth daily.  . polyethylene glycol (MIRALAX / GLYCOLAX) packet Take 17 g by mouth daily as needed for mild constipation.  . potassium chloride SA (K-DUR,KLOR-CON) 20 MEQ tablet TAKE 2 TABLETS BY MOUTH  DAILY  . ranitidine (ZANTAC) 150 MG tablet Take 1 tablet (150 mg total) by mouth 2 (two) times daily.  . simvastatin  (ZOCOR) 40 MG tablet Take 1 tablet (40 mg total) by mouth daily.  . tamsulosin (FLOMAX) 0.4 MG CAPS capsule Take 1 capsule (0.4 mg total) by mouth daily.  [name] topiramate (TOPAMAX) 200 MG tablet Take 1 tablet (200 mg total) by mouth 2 (two) times daily.  . verapamil (VERELAN PM) 240 MG 24 hr capsule TAKE ONE CAPSULE BY MOUTH EVERY NIGHT AT BEDTIME FOR BLOOD PRESSURE   No facility-administered encounter medications on file as of 06/22/2016.  Review of Systems  Constitutional:   No  weight loss, night sweats,  Fevers, chills,  +fatigue, or  lassitude.  HEENT:   No headaches,  Difficulty swallowing,  Tooth/dental problems, or  Sore throat,                No sneezing, itching, ear ache, nasal congestion, post nasal drip,   CV:  No chest pain,  Orthopnea, PND, swelling in lower extremities, anasarca, dizziness, palpitations, syncope.   GI  No heartburn, indigestion, abdominal pain, nausea, vomiting, diarrhea, change in bowel habits, loss of appetite, bloody stools.   Resp:    No chest wall deformity  Skin: no rash or lesions.  GU: no dysuria, change in color of urine, no urgency or frequency.  No flank pain, no hematuria   MS:  No joint pain or swelling.  No decreased range of motion.  No back pain.    Physical Exam  BP 110/66 (BP Location: Left Arm, Cuff Size: Large)   Pulse 81   Ht 5' 6"  (1.676 m)   Wt 235 lb 12.8 oz (107 kg)   SpO2 98%   BMI 38.06 kg/m   GEN: A/Ox3; pleasant , NAD obese    HEENT:  [place]/AT,  EACs-clear, TMs-wnl, NOSE-clear, THROAT-clear, no lesions, no postnasal drip or exudate noted. Class 2 MP airway   NECK:  Supple w/ fair ROM; no JVD; normal carotid impulses w/o bruits; no thyromegaly or nodules palpated; no lymphadenopathy.    RESP  Clear  P & A; w/o, wheezes/ rales/ or rhonchi. no accessory muscle use, no dullness to percussion  CARD:  RRR, no m/r/g, tr  peripheral edema, pulses intact, no cyanosis or clubbing.  GI:   Soft & nt; nml bowel sounds; no  organomegaly or masses detected.   Musco: Warm bil, no deformities or joint swelling noted.   Neuro: alert, no focal deficits noted.    Skin: Warm, no lesions or rashes    Lab Results:  CBC    Component Value Date/Time   WBC 6.4 05/06/2016 0908   RBC 4.17 05/06/2016 0908   HGB 12.1 05/06/2016 0908   HCT 37.6 05/06/2016 0908   HCT 39.5 05/08/2015 1106   PLT 268 05/06/2016 0908   PLT 274 05/08/2015 1106   MCV 90.2 05/06/2016 0908   MCV 91 05/08/2015 1106   MCH 29.0 05/06/2016 0908   MCHC 32.2 05/06/2016 0908   RDW 14.0 05/06/2016 0908   RDW 14.1 05/08/2015 1106   LYMPHSABS 1,664 05/06/2016 0908   LYMPHSABS 1.7 05/08/2015 1106   MONOABS 512 05/06/2016 0908   EOSABS 64 05/06/2016 0908   EOSABS 0.0 05/08/2015 1106   BASOSABS 0 05/06/2016 0908   BASOSABS 0.0 05/08/2015 1106    BMET    Component Value Date/Time   NA 138 05/06/2016 0908   NA 138 05/08/2015 1106   K 4.2 05/06/2016 0908   CL 109 05/06/2016 0908   CO2 20 05/06/2016 0908   GLUCOSE 251 (H) 05/06/2016 0908   BUN 14 05/06/2016 0908   BUN 14 05/08/2015 1106   CREATININE 0.86 05/06/2016 0908   CALCIUM 9.1 05/06/2016 0908   GFRNONAA 78 05/06/2016 0908   GFRAA >89 05/06/2016 0908    BNP No results found for: BNP  ProBNP    Component Value Date/Time   PROBNP 7.7 06/16/2013 1524    Imaging: No results found.   Assessment & Plan:   OSA (obstructive sleep apnea) Good control , still has a few events  and avg pressure is at 12 . Will adjust slightly to see if better control  Change to 5 to 15cm H2o . Check download in 1 month .   Cont on CPAP At bedtime   Supplies refills.  Wt loss   Asthma Mild asthma - does not appear to have active flare  Does have chronic cough ? Etiology  Check cxr  ? Ace inhibitior contributing .  Begin delsym Twice daily  .  Cont on GERD rx . /diet  If not improving will need further w/up .       Rexene Edison, NP 06/22/2016

## 2016-06-22 NOTE — Assessment & Plan Note (Signed)
Mild asthma - does not appear to have active flare  Does have chronic cough ? Etiology  Check cxr  ? Ace inhibitior contributing .  Begin delsym Twice daily  .  Cont on GERD rx . /diet  If not improving will need further w/up .

## 2016-06-22 NOTE — Patient Instructions (Addendum)
Continue on CPAP At bedtime   Setting were adjusted. Order for new supplies were sent.  Work on weight loss.  Continue on Symbicort 2 puffs daily , rinse after use.  Discuss with family doctor that Lisinopril may be aggravating your cough .  Delsym 2 tsp Twice daily  As needed  Cough.  Chest xray today .  follow up Dr. Elsworth Soho  In 1 year and As needed   Please contact office for sooner follow up if symptoms do not improve or worsen or seek emergency care

## 2016-06-22 NOTE — Addendum Note (Signed)
Addended by: Parke Poisson E on: 06/22/2016 09:36 AM   Modules accepted: Orders

## 2016-06-23 NOTE — Progress Notes (Signed)
Talked to patient and patient aware of results and recommendations  patient verbalized understanding and did not have any questions. Nothing further is needed.

## 2016-06-29 ENCOUNTER — Encounter: Payer: Self-pay | Admitting: Pharmacotherapy

## 2016-06-29 ENCOUNTER — Ambulatory Visit (INDEPENDENT_AMBULATORY_CARE_PROVIDER_SITE_OTHER): Payer: Medicare Other | Admitting: Pharmacotherapy

## 2016-06-29 VITALS — BP 108/70 | HR 70 | Ht 66.0 in | Wt 237.0 lb

## 2016-06-29 DIAGNOSIS — E119 Type 2 diabetes mellitus without complications: Secondary | ICD-10-CM

## 2016-06-29 DIAGNOSIS — I10 Essential (primary) hypertension: Secondary | ICD-10-CM

## 2016-06-29 NOTE — Progress Notes (Signed)
  Subjective:    [name] is a 51 y.o.African American female who presents for follow-up of Type 2 diabetes mellitus.   Last A1C was 10.0% on 05/06/16.   She had recently moved and was not taking insulin as prescribed prior to that lab. Should be taking Toujeo 86 units and Humalog 8 units with meals.  Forgot to bring blood glucose meter. She says her BG "have been good" She reports BG 120-140 range. Denies hypoglycemia.  She is waiting for a new CPAP mask. Under a lot of stress - husband being treated for bladder cancer. Trying to make healthy food choices.  She is riding a stationary bike for exercise and walking.  Wears glasses.  Does not have right peripheral vision.  Just had eye exam at [facility]. Does have peripheral edema. Denies problems with feet. Nocturia 5-6 times per night. No dysuria Staying well hydrated.  Review of Systems A comprehensive review of systems was negative except for: Eyes: positive for contacts/glasses Cardiovascular: positive for lower extremity edema Genitourinary: positive for nocturia    Objective:    BP 108/70   Pulse 70   Ht 5' 6"  (1.676 m)   Wt 237 lb (107.5 kg)   SpO2 96%   BMI 38.25 kg/m   General:  alert, cooperative and no distress  Oropharynx: normal findings: lips normal without lesions and gums healthy   Eyes:  negative findings: lids and lashes normal and conjunctivae and sclerae normal   Ears:  external ears normal        Lung: clear to auscultation bilaterally  Heart:  regular rate and rhythm     Extremities: edema bilateral LE  Skin: warm and dry, no hyperpigmentation, vitiligo, or suspicious lesions     Neuro: mental status, speech normal, alert and oriented x3 and gait and station normal   Lab Review Glucose, Bld (mg/dL)  Date Value  05/06/2016 251 (H)  12/22/2015 311 (H)  11/11/2015 252 (H)   CO2 (mmol/L)  Date Value  05/06/2016 20  11/11/2015 22  07/26/2015 CANCELED   BUN (mg/dL)  Date Value   05/06/2016 14  12/22/2015 14  11/11/2015 13  05/08/2015 14  12/19/2014 17  09/04/2014 13   Creat (mg/dL)  Date Value  05/06/2016 0.86  11/11/2015 1.03  07/26/2015 CANCELED   Creatinine, Ser (mg/dL)  Date Value  12/22/2015 0.90       Assessment:    Diabetes Mellitus type II, under good control. Self reports of BG are excellent. BP at goal <130/80   Plan:    1.  Rx changes: none  2.  Continue Toujeo 86 units daily. 3.  Continue Humalog 8 units with meals  4.  Counseled on nutrition goals. 5.  Counseled on exercise goals - 30-45 minutes 5 x week. 6.  BP at goal <130/80

## 2016-06-29 NOTE — Patient Instructions (Signed)
Keep up the good work

## 2016-06-30 NOTE — Progress Notes (Signed)
Reviewed & agree with plan  

## 2016-07-01 ENCOUNTER — Other Ambulatory Visit: Payer: Self-pay | Admitting: Nurse Practitioner

## 2016-07-01 NOTE — Telephone Encounter (Signed)
Patient requested to be sent to [facility].

## 2016-07-06 ENCOUNTER — Other Ambulatory Visit: Payer: Self-pay | Admitting: *Deleted

## 2016-07-06 MED ORDER — INSULIN LISPRO 200 UNIT/ML ~~LOC~~ SOPN: 8.0000 [IU] | PEN_INJECTOR | Freq: Three times a day (TID) | SUBCUTANEOUS | 3 refills | Status: DC

## 2016-07-06 NOTE — Telephone Encounter (Signed)
[facility] 

## 2016-07-13 ENCOUNTER — Ambulatory Visit: Payer: Medicare Other | Admitting: Nurse Practitioner

## 2016-07-14 ENCOUNTER — Other Ambulatory Visit: Payer: Self-pay | Admitting: *Deleted

## 2016-07-14 ENCOUNTER — Telehealth: Payer: Self-pay | Admitting: *Deleted

## 2016-07-14 MED ORDER — INSULIN PEN NEEDLE 31G X 8 MM MISC: 3 refills | Status: DC

## 2016-07-14 NOTE — Telephone Encounter (Signed)
Patient called in tears because she doesn't have anymore pen needles to give herself insulin. Stated that [facility] told her that they sent [name] a refill request on the 18th. Nothing has been received. Patient stated that she called them and they told her that they sent it to a provider named Olivia Mackie. Provider unknown.  I have samples of pen needles in office and offered to give her some until she could receive hers in the mail. Cannot afford a local Rx. She was grateful. Left up front for pick up. Rx sent to [facility] for pen needles.

## 2016-07-15 ENCOUNTER — Encounter: Payer: Medicare Other | Attending: Nurse Practitioner | Admitting: *Deleted

## 2016-07-15 DIAGNOSIS — E1165 Type 2 diabetes mellitus with hyperglycemia: Secondary | ICD-10-CM

## 2016-07-15 DIAGNOSIS — IMO0002 Reserved for concepts with insufficient information to code with codable children: Secondary | ICD-10-CM

## 2016-07-15 DIAGNOSIS — E119 Type 2 diabetes mellitus without complications: Secondary | ICD-10-CM | POA: Diagnosis present

## 2016-07-15 DIAGNOSIS — Z6838 Body mass index (BMI) 38.0-38.9, adult: Secondary | ICD-10-CM | POA: Diagnosis not present

## 2016-07-15 DIAGNOSIS — Z713 Dietary counseling and surveillance: Secondary | ICD-10-CM | POA: Insufficient documentation

## 2016-07-15 DIAGNOSIS — E1140 Type 2 diabetes mellitus with diabetic neuropathy, unspecified: Secondary | ICD-10-CM

## 2016-07-15 NOTE — Progress Notes (Signed)
Diabetes Self-Management Education  Visit Type: First/Initial  Appt. Start Time: 0900 Appt. End Time: 1030  07/15/2016  [name], identified by name and date of birth, is a 51 y.o. female with a diagnosis of Diabetes: Type 2. She is here with her husband who participated in the visit. She states she has been disabled since 2003 with multiple strokes, epilepsy, MI, and is legally blind in her left eye. They have just moved from a first floor to 2nd floor apartment so she is taking the stairs some almost every day. She states she spends several hours on the computer every day.   ASSESSMENT  Height 5' 6"  (1.676 m), weight 238 lb 4.8 oz (108.1 kg). Body mass index is 38.46 kg/m.      Diabetes Self-Management Education - 07/15/16 0911      Visit Information   Visit Type First/Initial     Initial Visit   Diabetes Type Type 2   Are you currently following a meal plan? Yes   What type of meal plan do you follow? Diabetec   Date Diagnosed 30 years ago     Health Coping   How would you rate your overall health? Fair     Psychosocial Assessment   Self-care barriers Debilitated state due to current medical condition   Other persons present Patient;Spouse/SO   Patient Concerns Glycemic Control;Nutrition/Meal planning   What is the last grade level you completed in school? 2 years college     Complications   Last HgB A1C per patient/outside source 9 %   Fasting Blood glucose range (mg/dL) 180-200;130-179;70-129   Number of hypoglycemic episodes per month 0   Have you had a dilated eye exam in the past 12 months? Yes   Have you had a dental exam in the past 12 months? Yes   Are you checking your feet? Yes   How many days per week are you checking your feet? 3     Dietary Intake   Breakfast 2 toast with butter OR plain oatmeal with butter and 1 tsp sugar OR 4 small pancakes with butter and lite syrup OR eggs and grits, [place] bacon   Snack (morning) goes back to bed and  sleeps   Lunch left overs, lean meat, vegetables, limited starch servings OR 1-2 sandwiches, occasionally a yogurt   Snack (afternoon) chips and dip OR sherbet or ice cream OR strawberries   Dinner lean meat, vegetables and some starches occasionally   Snack (evening) none   Beverage(s) coffee with Sweet and Low and lactaid milk 4 oz, fruit juice, lots of water     Exercise   Exercise Type ADL's   How many days per week to you exercise? 0   How many minutes per day do you exercise? 0   Total minutes per week of exercise 0     Patient Education   Previous Diabetes Education Yes (please comment)  8 years ago   Disease state  Definition of diabetes, type 1 and 2, and the diagnosis of diabetes   Nutrition management  Role of diet in the treatment of diabetes and the relationship between the three main macronutrients and blood glucose level;Food label reading, portion sizes and measuring food.;Carbohydrate counting   Physical activity and exercise  Role of exercise on diabetes management, blood pressure control and cardiac health.;Helped patient identify appropriate exercises in relation to his/her diabetes, diabetes complications and other health issue.   Monitoring Identified appropriate SMBG and/or A1C goals.  Psychosocial adjustment Worked with patient to identify barriers to care and solutions     Individualized Goals (developed by patient)   Nutrition Follow meal plan discussed   Physical Activity Exercise 3-5 times per week   Medications take my medication as prescribed   Monitoring  test blood glucose pre and post meals as discussed     Outcomes   Expected Outcomes Demonstrated interest in learning. Expect positive outcomes   Future DMSE PRN   Program Status Completed      Individualized Plan for Diabetes Self-Management Training:   Learning Objective:  Patient will have a greater understanding of diabetes self-management. Patient education plan is to attend individual  and/or group sessions per assessed needs and concerns.   Plan:   Patient Instructions  Plan:  Aim for 3 Carb Choices per meal (45 grams) +/- 1 either way  Aim for 0-1 Carbs per snack if hungry  Include protein in moderation with your meals and snacks Consider reading food labels for Total Carbohydrate of foods Consider  increasing your activity level by doing some Arm Chair Exercises on-line daily as tolerated Continue checking BG at alternate times per day as directed by MD  Constinue taking medication as directed by MD  Expected Outcomes:  Demonstrated interest in learning. Expect positive outcomes  Education material provided: Living Well with Diabetes, A1C conversion sheet, Meal plan card and Carbohydrate counting sheet  If problems or questions, patient to contact team via:  Phone  Future DSME appointment: PRN

## 2016-07-15 NOTE — Patient Instructions (Signed)
Plan:  Aim for 3 Carb Choices per meal (45 grams) +/- 1 either way  Aim for 0-1 Carbs per snack if hungry  Include protein in moderation with your meals and snacks Consider reading food labels for Total Carbohydrate of foods Consider  increasing your activity level by doing some Arm Chair Exercises on-line daily as tolerated Continue checking BG at alternate times per day as directed by MD  Constinue taking medication as directed by MD

## 2016-07-24 ENCOUNTER — Other Ambulatory Visit: Payer: Self-pay | Admitting: Nurse Practitioner

## 2016-07-30 ENCOUNTER — Ambulatory Visit (INDEPENDENT_AMBULATORY_CARE_PROVIDER_SITE_OTHER): Payer: Medicare Other | Admitting: Nurse Practitioner

## 2016-07-30 ENCOUNTER — Encounter: Payer: Self-pay | Admitting: Nurse Practitioner

## 2016-07-30 VITALS — BP 118/82 | HR 79 | Temp 97.9°F | Resp 18 | Ht 66.0 in | Wt 235.0 lb

## 2016-07-30 DIAGNOSIS — E1165 Type 2 diabetes mellitus with hyperglycemia: Secondary | ICD-10-CM

## 2016-07-30 DIAGNOSIS — I10 Essential (primary) hypertension: Secondary | ICD-10-CM

## 2016-07-30 DIAGNOSIS — E876 Hypokalemia: Secondary | ICD-10-CM | POA: Diagnosis not present

## 2016-07-30 DIAGNOSIS — N3281 Overactive bladder: Secondary | ICD-10-CM

## 2016-07-30 DIAGNOSIS — IMO0002 Reserved for concepts with insufficient information to code with codable children: Secondary | ICD-10-CM

## 2016-07-30 DIAGNOSIS — K219 Gastro-esophageal reflux disease without esophagitis: Secondary | ICD-10-CM | POA: Diagnosis not present

## 2016-07-30 DIAGNOSIS — R569 Unspecified convulsions: Secondary | ICD-10-CM | POA: Diagnosis not present

## 2016-07-30 DIAGNOSIS — I252 Old myocardial infarction: Secondary | ICD-10-CM

## 2016-07-30 DIAGNOSIS — E782 Mixed hyperlipidemia: Secondary | ICD-10-CM

## 2016-07-30 DIAGNOSIS — E1140 Type 2 diabetes mellitus with diabetic neuropathy, unspecified: Secondary | ICD-10-CM

## 2016-07-30 LAB — LIPID PANEL
Cholesterol: 144 mg/dL (ref <200)
HDL: 39 mg/dL — ABNORMAL LOW (ref >50)
LDL Cholesterol: 80 mg/dL (ref <100)
Total CHOL/HDL Ratio: 3.7 Ratio (ref <5.0)
Triglycerides: 123 mg/dL (ref <150)
VLDL: 25 mg/dL (ref <30)

## 2016-07-30 LAB — COMPLETE METABOLIC PANEL WITH GFR
ALT: 9 U/L (ref 6–29)
AST: 7 U/L — ABNORMAL LOW (ref 10–35)
Albumin: 3.9 g/dL (ref 3.6–5.1)
Alkaline Phosphatase: 93 U/L (ref 33–130)
BUN: 17 mg/dL (ref 7–25)
CO2: 20 mmol/L (ref 20–31)
Calcium: 9 mg/dL (ref 8.6–10.4)
Chloride: 107 mmol/L (ref 98–110)
Creat: 0.95 mg/dL (ref 0.50–1.05)
GFR, Est African American: 80 mL/min (ref >=60)
GFR, Est Non African American: 70 mL/min (ref >=60)
Glucose, Bld: 312 mg/dL — ABNORMAL HIGH (ref 65–99)
Potassium: 4.6 mmol/L (ref 3.5–5.3)
Sodium: 136 mmol/L (ref 135–146)
Total Bilirubin: 0.2 mg/dL (ref 0.2–1.2)
Total Protein: 7.3 g/dL (ref 6.1–8.1)

## 2016-07-30 MED ORDER — HYDRALAZINE HCL 25 MG PO TABS: 25.0000 mg | ORAL_TABLET | Freq: Three times a day (TID) | ORAL | 3 refills | Status: DC

## 2016-07-30 MED ORDER — POLYETHYLENE GLYCOL 3350 17 G PO PACK
17.0000 g | PACK | Freq: Every day | ORAL | 3 refills | Status: DC | PRN
Start: 2016-07-30 — End: 2019-06-01

## 2016-07-30 MED ORDER — GABAPENTIN 300 MG PO CAPS
ORAL_CAPSULE | ORAL | 1 refills | Status: DC
Start: 2016-07-30 — End: 2016-12-01

## 2016-07-30 MED ORDER — INSULIN GLARGINE 300 UNIT/ML ~~LOC~~ SOPN: 86.0000 [IU] | PEN_INJECTOR | Freq: Every day | SUBCUTANEOUS | 1 refills | Status: DC

## 2016-07-30 MED ORDER — ONDANSETRON 4 MG PO TBDP: ORAL_TABLET | ORAL | 1 refills | Status: DC

## 2016-07-30 MED ORDER — TAMSULOSIN HCL 0.4 MG PO CAPS: 0.4000 mg | ORAL_CAPSULE | Freq: Every day | ORAL | 1 refills | Status: DC

## 2016-07-30 MED ORDER — BUDESONIDE-FORMOTEROL FUMARATE 160-4.5 MCG/ACT IN AERO: 2.0000 | INHALATION_SPRAY | Freq: Every day | RESPIRATORY_TRACT | 3 refills | Status: DC

## 2016-07-30 MED ORDER — LOSARTAN POTASSIUM 50 MG PO TABS: 50.0000 mg | ORAL_TABLET | Freq: Every day | ORAL | 1 refills | Status: DC

## 2016-07-30 MED ORDER — INSULIN PEN NEEDLE 31G X 8 MM MISC: 3 refills | Status: DC

## 2016-07-30 MED ORDER — FLUTICASONE PROPIONATE 50 MCG/ACT NA SUSP: 2.0000 | Freq: Every day | NASAL | 3 refills | Status: DC

## 2016-07-30 MED ORDER — PANTOPRAZOLE SODIUM 40 MG PO TBEC: 40.0000 mg | DELAYED_RELEASE_TABLET | Freq: Every day | ORAL | 1 refills | Status: DC

## 2016-07-30 MED ORDER — POTASSIUM CHLORIDE CRYS ER 20 MEQ PO TBCR: 40.0000 meq | EXTENDED_RELEASE_TABLET | Freq: Every day | ORAL | 1 refills | Status: DC

## 2016-07-30 MED ORDER — ALBUTEROL SULFATE HFA 108 (90 BASE) MCG/ACT IN AERS: INHALATION_SPRAY | RESPIRATORY_TRACT | 3 refills | Status: DC

## 2016-07-30 MED ORDER — BACLOFEN 10 MG PO TABS: ORAL_TABLET | ORAL | 1 refills | Status: DC

## 2016-07-30 MED ORDER — GLUCOSE BLOOD VI STRP: 1.0000 | ORAL_STRIP | Freq: Three times a day (TID) | 3 refills | Status: DC

## 2016-07-30 MED ORDER — TOPIRAMATE 200 MG PO TABS: 200.0000 mg | ORAL_TABLET | Freq: Two times a day (BID) | ORAL | 3 refills | Status: DC

## 2016-07-30 MED ORDER — CLOPIDOGREL BISULFATE 75 MG PO TABS: 75.0000 mg | ORAL_TABLET | Freq: Every day | ORAL | 3 refills | Status: DC

## 2016-07-30 MED ORDER — VERAPAMIL HCL ER 240 MG PO CP24: ORAL_CAPSULE | ORAL | 1 refills | Status: DC

## 2016-07-30 MED ORDER — ONETOUCH DELICA LANCETS 33G MISC: 3 refills | Status: DC

## 2016-07-30 MED ORDER — SIMVASTATIN 40 MG PO TABS: 40.0000 mg | ORAL_TABLET | Freq: Every day | ORAL | 1 refills | Status: DC

## 2016-07-30 MED ORDER — INSULIN LISPRO 200 UNIT/ML ~~LOC~~ SOPN: 8.0000 [IU] | PEN_INJECTOR | Freq: Three times a day (TID) | SUBCUTANEOUS | 3 refills | Status: DC

## 2016-07-30 MED ORDER — MIRABEGRON ER 50 MG PO TB24: 50.0000 mg | ORAL_TABLET | Freq: Every day | ORAL | 1 refills | Status: DC

## 2016-07-30 MED ORDER — RANITIDINE HCL 150 MG PO TABS: 150.0000 mg | ORAL_TABLET | Freq: Two times a day (BID) | ORAL | 3 refills | Status: DC

## 2016-07-30 NOTE — Patient Instructions (Signed)
Follow up in 1 month on blood pressure To stop lisinopril once complete and start losartan

## 2016-07-30 NOTE — Progress Notes (Signed)
Careteam: Patient Care Team: Lauree Chandler, NP as PCP - General (Nurse Practitioner) [place] Comment, [place], [place] as Referring Physician (Optometry)  Advanced Directive information Does Patient Have a Medical Advance Directive?: No  Allergies  Allergen Reactions  . Codeine Swelling and Other (See Comments)    Swelling and burning of mouth (inside)  . Lactose Intolerance (Gi) Nausea And Vomiting    Chief Complaint  Patient presents with  . Medical Management of Chronic Issues    Pt is being seen for a 3 month routine visit. Does not want shingrix.      HPI: Patient is a 51 y.o. female seen in the office today for routine follow up. Pt with hx of diabetes, OSA, seizure disorder, migraine, HTN, CVA, blindness, anxiety and depression.  Having issues getting Rx all will be reordered today  Essential hypertension- well controlled. On hydralazine or isinopril.   Uncontrolled type 2 diabetes mellitus with diabetic- following with [facility]; last A1c was 10. Reports sometimes blood sugars in the 200s sometimes "normal" went to the nutritionist but states she said just to monitor what she eats and said she could cont to eat what she was eating. Needs to reschedule her follow up.   MYOCARDIAL INFARCTION, HX OF- conts on plavix, no additional chest pains.   OSA (obstructive sleep apnea)- using CPAP, following with pulmonary.   Gastroesophageal reflux disease without esophagitis-controlled on  protonix and zantac.   Anxiety and depression- in remission, still hasnt gotten married but reports she is getting married soon.   BLINDNESS, LEGAL, [place] DEFINITION- following by ophthalmologist who is following her cataracts   Mixed hyperlipidemia-has fasted today,  Zocor was increased after last visit due to increase in LDL taking zocor LDL- 112, attempting heart health diet, goal <70.   Chronic pain- in lower back and legs, have not been seeing chronic pain specialist in over a year after she  was discharged from PT and 3rd pain shot she stopped going. Taking naproxen twice daily - pain is controlled.   Having hot flashes (since her 37s) but otherwise everything is good.   Review of Systems:  Review of Systems  Constitutional: Negative for chills, fever and malaise/fatigue.  HENT: Negative for hearing loss.   Eyes: Negative for blurred vision.  Respiratory: Positive for cough (goes and comes, more at night). Negative for shortness of breath.   Cardiovascular: Negative for chest pain and leg swelling.  Gastrointestinal: Negative for abdominal pain, blood in stool, constipation, diarrhea, heartburn and melena.  Genitourinary: Negative for dysuria and urgency.  Musculoskeletal: Negative for falls.  Neurological: Negative for dizziness, loss of consciousness and weakness.  Endo/Heme/Allergies:       Diabetes  Psychiatric/Behavioral: Negative for depression and memory loss.    Past Medical History:  Diagnosis Date  . Anxiety   . Arthritis    joint pain   . Asthma   . COPD (chronic obstructive pulmonary disease) (HCC)    chronic bronchitis   . CVA 11/14/2007   Qualifier: Diagnosis of  By: Hassell Done [name], Tori Milks    . Depression   . Diabetes mellitus    since age 64; type 2 IDDM  . Diabetic neuropathy, painful ([place])    FEET AND HANDS  . Dilated aortic root ([place])    75m by echo 11/2015  . Dizziness    secondary to diabetes and hypertension   . Dysrhythmia    skipped beats  . Epilepsy idiopathic petit mal ([facility]    last seizure  2012;controlled w/ topomax  . GERD (gastroesophageal reflux disease)   . Glaucoma    NOT ON ANY EYE DROPS   . Headache(784.0)    migraines   . Heart murmur    born with   . Hx of blood clots    hematomas removed from left side of brain from 63mo to 89yrold   . Hyperlipidemia   . Hypertension   . Legally blind    left eye   . MIGRAINE HEADACHE 09/14/2006   Qualifier: Diagnosis of  By: MaHassell DoneNP, NyTori Milks  . [place] infarction  ([place]   hi of x 2  last one 2003   . Nephrolithiasis    frequent urination , urination at nite  PT SEEN IN ER 09/22/11 FOR BACK AND RT SIDED PAIN--HAS KNOWN STONE RT URETER AND UA IN ER SHOWED UTI  . Neuropathy    due to diabetes   . Pain 09/23/11   LOWER BACK AND RIGHT SIDE--PT HAS RIGHT URETERAL STONE  . Pneumonia    hx of 2009  . Pyelonephritis   . Seizures ([facility]  . Sleep ap[facility]    [facility] not [facility] ; mild  . Stress incontinence   . [facility]   last 2003  RESIDUAL LEFT LEG WEAKNESS--NO OTHER RESIDUAL PROBLEMS   Past Surgical History:  Procedure Laterality Date  . BRAIN HEMATOMA EVACUATION     five procedures total, first procedure when [age] monthsld, last at [age]ears of age.  . CYSTOSCOPY W/ RETROGRADES  09/24/2011   Procedure: CYSTOSCOPY WITH RETROGRADE PYELOGRAM;  Surgeon: JoMalka SoMD;  Location: [facility];  Service: Urology;  Laterality: Right;  . CYSTOSCOPY WITH URETEROSCOPY  09/24/2011   Procedure: CYSTOSCOPY WITH URETEROSCOPY;  Surgeon: JoMalka SoMD;  Location: [facility];  Service: Urology;  Laterality: Right;  Balloon dilation right ureter   . CYSTOSCOPY/RETROGRADE/URETEROSCOPY  08/24/2011   Procedure: CYSTOSCOPY/RETROGRADE/URETEROSCOPY;  Surgeon: DaMolli HazardMD;  Location: [facility];  Service: Urology;  Laterality: Right;  Cysto, Right retrograde Pyelogram, right stent placement.   . Fatima BlankERNIA REPAIR  05/05/2011   Procedure: LAPAROSCOPIC INCISIONAL HERNIA;  Surgeon: MaRolm BookbinderMD;  [facility] Service: General;  Laterality: N/A;  . kidney stone removal    . MASS EXCISION  05/05/2011   Procedure: EXCISION MASS;  Surgeon: MaRolm BookbinderMD;  [facility] Service: General;  Laterality: Right;  . PCNL    . RETINAL DETACHMENT SURGERY Left 1990  . SHUNT REMOVAL     shunt inserted at age 77 78emoved at age 51 . VAGINAL HYSTERECTOMY  1996   Social History:   reports that she quit smoking about 29 years ago.  Her smoking use included Cigarettes. She has a 15.00 pack-year smoking history. She has never used smokeless tobacco. She reports that she drinks alcohol. She reports that she does not use drugs.  Family History  Problem Relation Age of Onset  . Uterine cancer Mother   . Hypertension Mother   . Brain cancer Maternal Grandmother   . Hypertension Maternal Grandmother   . Cirrhosis Maternal Grandfather   . Anesthesia problems Neg Hx   . Colon cancer Neg Hx   . Esophageal cancer Neg Hx   . Pancreatic cancer Neg Hx     Medications: Patient's Medications  New Prescriptions   No medications on file  Previous Medications   ALBUTEROL (PROAIR HFA) 108 (90  BASE) MCG/ACT INHALER    INHALE 2 PUFFS BY MOUTH EVERY 6 HOURS AS NEEDED FOR WHEEZING OR SHORTNESS OF BREATH   AMBULATORY NON FORMULARY MEDICATION    Medication Name: Muscle and Joint Vanishing Scent Gel- Apply as needed to skin   BACLOFEN (LIORESAL) 10 MG TABLET    Take one tablet by mouth three times daily as needed for muscle spasms   BUDESONIDE-FORMOTEROL (SYMBICORT) 160-4.5 MCG/ACT INHALER    Inhale 2 puffs into the lungs daily.   CALCIUM CARBONATE-VITAMIN D (CALCIUM-VITAMIN D3 PO)    D3 500- calcium 630 mg Take 1 capsule by mouth once daily   CLOPIDOGREL (PLAVIX) 75 MG TABLET    Take 1 tablet (75 mg total) by mouth daily.   CYCLOSPORINE (RESTASIS) 0.05 % OPHTHALMIC EMULSION    Place 2 drops into both eyes 2 (two) times daily.   FLUOROMETHOLONE (FML) 0.1 % OPHTHALMIC SUSPENSION    Place 2 drops into both eyes 2 (two) times daily.   FLUTICASONE (FLONASE) 50 MCG/ACT NASAL SPRAY    Place 2 sprays into both nostrils daily.   GABAPENTIN (NEURONTIN) 300 MG CAPSULE    TAKE 2 CAPSULES BY MOUTH 3  TIMES DAILY   GLUCOSE BLOOD (ONETOUCH VERIO) TEST STRIP    1 each by Other route 3 (three) times daily. E11.29   HYDRALAZINE (APRESOLINE) 25 MG TABLET    Take 1 tablet (25 mg total) by mouth 3 (three) times daily.   INSULIN LISPRO (HUMALOG KWIKPEN) 200  UNIT/ML SOPN    Inject 8 Units into the skin 3 (three) times daily with meals.   INSULIN PEN NEEDLE (EASY TOUCH PEN NEEDLES) 31G X 8 MM MISC    Use as directed in giving insulin. Dx: E11.29   LISINOPRIL (PRINIVIL,ZESTRIL) 20 MG TABLET    Take 1 tablet (20 mg total) by mouth daily.   MIRABEGRON ER (MYRBETRIQ) 50 MG TB24 TABLET    Take 1 tablet (50 mg total) by mouth daily.   ONDANSETRON (ZOFRAN-ODT) 4 MG DISINTEGRATING TABLET    TAKE 1 TABLET BY MOUTH EVERY 8 HOURS AS NEEDED FOR NAUSEA OR VOMITING   ONETOUCH DELICA LANCETS 71I MISC    Use as directed.   PANTOPRAZOLE (PROTONIX) 40 MG TABLET    Take 1 tablet (40 mg total) by mouth daily.   POLYETHYLENE GLYCOL (MIRALAX / GLYCOLAX) PACKET    Take 17 g by mouth daily as needed for mild constipation.   POTASSIUM CHLORIDE SA (K-DUR,KLOR-CON) 20 MEQ TABLET    TAKE 2 TABLETS BY MOUTH  DAILY   RANITIDINE (ZANTAC) 150 MG TABLET    Take 1 tablet (150 mg total) by mouth 2 (two) times daily.   SIMVASTATIN (ZOCOR) 40 MG TABLET    Take 1 tablet (40 mg total) by mouth daily.   TAMSULOSIN (FLOMAX) 0.4 MG CAPS CAPSULE    Take 1 capsule (0.4 mg total) by mouth daily.   TOPIRAMATE (TOPAMAX) 200 MG TABLET    Take 1 tablet (200 mg total) by mouth 2 (two) times daily.   TOUJEO SOLOSTAR 300 UNIT/ML SOPN    INJECT SUBCUTANEOUSLY 86  UNITS DAILY   VERAPAMIL (VERELAN PM) 240 MG 24 HR CAPSULE    TAKE ONE CAPSULE BY MOUTH EVERY NIGHT AT BEDTIME FOR BLOOD PRESSURE  Modified Medications   No medications on file  Discontinued Medications   No medications on file     Physical Exam:  Vitals:   07/30/16 1002  BP: 118/82  Pulse: 79  Resp: 18  Temp: 97.9 F (36.6 C)  TempSrc: Oral  SpO2: 98%  Weight: 235 lb (106.6 kg)  Height: 5' 6"  (1.676 m)   Body mass index is 37.93 kg/m.  Physical Exam  Constitutional: She is oriented to person, place, and time. She appears well-developed and well-nourished. No distress.  Obese black female  HENT:  Head: Normocephalic and  atraumatic.  Nose: Nose normal.  Mouth/Throat: Oropharynx is clear and moist.  Eyes: Pupils are equal, round, and reactive to light. Conjunctivae and EOM are normal.  Proptosis of eyes  Neck: Normal range of motion. Neck supple.  Cardiovascular: Normal rate, regular rhythm and normal heart sounds.   Pulmonary/Chest: Effort normal and breath sounds normal. No respiratory distress.  Abdominal: Soft. Normal appearance and bowel sounds are normal. She exhibits no distension. There is no tenderness. There is no rebound and no CVA tenderness.  Musculoskeletal: She exhibits no tenderness.  Neurological: She is alert and oriented to person, place, and time.  Skin: Skin is warm and dry.  Psychiatric: She has a normal mood and affect.    Labs reviewed: Basic Metabolic Panel:  Recent Labs  11/11/15 1115 12/22/15 2338 05/06/16 0908  NA 139 139 138  K 4.2 3.6 4.2  CL 109 101 109  CO2 22  --  20  GLUCOSE 252* 311* 251*  BUN 13 14 14   CREATININE 1.03 0.90 0.86  CALCIUM 9.1  --  9.1   Liver Function Tests:  Recent Labs  11/11/15 1115 05/06/16 0908  AST 8* 9*  ALT 9 12  ALKPHOS 62 90  BILITOT 0.2 0.2  PROT 6.6 7.3  ALBUMIN 3.6 3.9   No results for input(s): LIPASE, AMYLASE in the last 8760 hours. No results for input(s): AMMONIA in the last 8760 hours. CBC:  Recent Labs  12/22/15 2338 05/06/16 0908  WBC  --  6.4  NEUTROABS  --  4,160  HGB 13.3 12.1  HCT 39.0 37.6  MCV  --  90.2  PLT  --  268   Lipid Panel:  Recent Labs  05/06/16 0908  CHOL 182  HDL 46*  LDLCALC 112*  TRIG 122  CHOLHDL 4.0   TSH: No results for input(s): TSH in the last 8760 hours. A1C: Lab Results  Component Value Date   HGBA1C 10.0 (H) 05/06/2016     Assessment/Plan 1. Essential hypertension, benign Pt with cough, will change lisinopril to losartan to hopefully help with this.  - verapamil (VERELAN PM) 240 MG 24 hr capsule; TAKE ONE CAPSULE BY MOUTH EVERY NIGHT AT BEDTIME FOR BLOOD  PRESSURE  Dispense: 90 capsule; Refill: 1 - losartan (COZAAR) 50 MG tablet; Take 1 tablet (50 mg total) by mouth daily.  Dispense: 90 tablet; Refill: 1 - hydrALAZINE (APRESOLINE) 25 MG tablet; Take 1 tablet (25 mg total) by mouth 3 (three) times daily.  Dispense: 270 tablet; Refill: 3  2. Overactive bladder -stable on myrbetriq  - mirabegron ER (MYRBETRIQ) 50 MG TB24 tablet; Take 1 tablet (50 mg total) by mouth daily.  Dispense: 90 tablet; Refill: 1  3. Hypokalemia  - potassium chloride SA (K-DUR,KLOR-CON) 20 MEQ tablet; Take 2 tablets (40 mEq total) by mouth daily.  Dispense: 180 tablet; Refill: 1  4. MYOCARDIAL INFARCTION, HX OF -no recurrent chest pains - clopidogrel (PLAVIX) 75 MG tablet; Take 1 tablet (75 mg total) by mouth daily.  Dispense: 90 tablet; Refill: 3  6. Gastroesophageal reflux disease without esophagitis Controlled  - pantoprazole (PROTONIX) 40 MG tablet; Take 1 tablet (  40 mg total) by mouth daily.  Dispense: 90 tablet; Refill: 1 - ranitidine (ZANTAC) 150 MG tablet; Take 1 tablet (150 mg total) by mouth 2 (two) times daily.  Dispense: 180 tablet; Refill: 3  7. Mixed hyperlipidemia -encouraged dietary compliance along with medication - simvastatin (ZOCOR) 40 MG tablet; Take 1 tablet (40 mg total) by mouth daily.  Dispense: 90 tablet; Refill: 1 - CMP with eGFR - Lipid Panel  8. Seizures ([place]) -no recent seizures  - topiramate (TOPAMAX) 200 MG tablet; Take 1 tablet (200 mg total) by mouth 2 (two) times daily.  Dispense: 180 tablet; Refill: 3  9. DM type 2, uncontrolled, with neuropathy (HCC) -A1c worse on last check, encouraged pt to maintain dietary log. Reports she went to nutritionist who told her not to change anything however I suspect there is a learning barrier, plans to follow up.  - glucose blood (ONETOUCH VERIO) test strip; 1 each by Other route 3 (three) times daily. E11.29  Dispense: 300 each; Refill: 3 - Insulin Lispro (HUMALOG KWIKPEN) 200 UNIT/ML SOPN;  Inject 8 Units into the skin 3 (three) times daily with meals.  Dispense: 15 pen; Refill: 3 - Insulin Pen Needle (EASY TOUCH PEN NEEDLES) 31G X 8 MM MISC; Use as directed in giving insulin. Dx: E11.29  Dispense: 400 each; Refill: 3 - ONETOUCH DELICA LANCETS 68G MISC; Use as directed.  Dispense: 100 each; Refill: 3 - Insulin Glargine (TOUJEO SOLOSTAR) 300 UNIT/ML SOPN; Inject 86 Units into the skin daily.  Dispense: 27 mL; Refill: 1  Follow up in 1 month due to changes in blood pressure medication.   Carlos American. Harle Battiest  [facility] [phone] 8 am - 5 pm) [phone] (after hours)

## 2016-07-31 ENCOUNTER — Other Ambulatory Visit: Payer: Self-pay | Admitting: *Deleted

## 2016-07-31 MED ORDER — ONETOUCH VERIO W/DEVICE KIT: 1.0000 | PACK | Freq: Once | 0 refills | Status: AC

## 2016-07-31 NOTE — Telephone Encounter (Signed)
Patient requested a new blood sugar machine, onetouch Verio due to hers broke. Faxed.

## 2016-08-04 ENCOUNTER — Telehealth: Payer: Self-pay | Admitting: *Deleted

## 2016-08-04 ENCOUNTER — Other Ambulatory Visit: Payer: Self-pay | Admitting: Nurse Practitioner

## 2016-08-04 MED ORDER — ONETOUCH VERIO W/DEVICE KIT: 1.0000 | PACK | Freq: Three times a day (TID) | 0 refills | Status: DC

## 2016-08-04 NOTE — Telephone Encounter (Signed)
Patient called and stated that she spoke with CMA at visit and stated that her blood sugar machine had broken. Needs new One Touch Verio faxed to [facility].  Faxed.

## 2016-08-05 ENCOUNTER — Telehealth: Payer: Self-pay | Admitting: *Deleted

## 2016-08-05 DIAGNOSIS — E1140 Type 2 diabetes mellitus with diabetic neuropathy, unspecified: Secondary | ICD-10-CM

## 2016-08-05 DIAGNOSIS — E1165 Type 2 diabetes mellitus with hyperglycemia: Principal | ICD-10-CM

## 2016-08-05 DIAGNOSIS — IMO0002 Reserved for concepts with insufficient information to code with codable children: Secondary | ICD-10-CM

## 2016-08-05 MED ORDER — INSULIN GLARGINE 300 UNIT/ML ~~LOC~~ SOPN: 90.0000 [IU] | PEN_INJECTOR | Freq: Every day | SUBCUTANEOUS | 3 refills | Status: DC

## 2016-08-05 NOTE — Telephone Encounter (Signed)
Patient called and stated that she received her new blood sugar meter today and she took her blood sugar and it was 332. Confirmed Insulin with patient is taking Humalog 8units three times daily and Toujeo 86units daily. Patient is sticking to a strick diet and watching what she eats. Please Advise.

## 2016-08-05 NOTE — Telephone Encounter (Signed)
Lets increase toujeo to 90 units and cont to take blood sugars.

## 2016-08-05 NOTE — Telephone Encounter (Signed)
Patient notified and agreed. Wants Toujeo Rx faxed to [facility]. Faxed.

## 2016-08-07 ENCOUNTER — Other Ambulatory Visit: Payer: Self-pay | Admitting: Nurse Practitioner

## 2016-08-07 ENCOUNTER — Encounter: Payer: Medicare Other | Attending: Nurse Practitioner | Admitting: *Deleted

## 2016-08-07 DIAGNOSIS — Z6838 Body mass index (BMI) 38.0-38.9, adult: Secondary | ICD-10-CM | POA: Insufficient documentation

## 2016-08-07 DIAGNOSIS — Z713 Dietary counseling and surveillance: Secondary | ICD-10-CM | POA: Diagnosis not present

## 2016-08-07 DIAGNOSIS — IMO0002 Reserved for concepts with insufficient information to code with codable children: Secondary | ICD-10-CM

## 2016-08-07 DIAGNOSIS — E1140 Type 2 diabetes mellitus with diabetic neuropathy, unspecified: Secondary | ICD-10-CM

## 2016-08-07 DIAGNOSIS — E119 Type 2 diabetes mellitus without complications: Secondary | ICD-10-CM | POA: Insufficient documentation

## 2016-08-07 DIAGNOSIS — E1165 Type 2 diabetes mellitus with hyperglycemia: Secondary | ICD-10-CM

## 2016-08-07 MED ORDER — INSULIN LISPRO 200 UNIT/ML ~~LOC~~ SOPN: 8.0000 [IU] | PEN_INJECTOR | Freq: Three times a day (TID) | SUBCUTANEOUS | 3 refills | Status: DC

## 2016-08-07 NOTE — Progress Notes (Signed)
Diabetes Self-Management Education  Visit Type:     Appt. Start Time: 1100 Appt. End Time: 1200  08/07/2016  [name], identified by name and date of birth, is a 51 y.o. female with a diagnosis of Diabetes:  .  She is here with her husband again who participates in the visit in a positive way. She has obtained a new meter and upon review of carb counting she expresses good understanding of carb containing foods. She has not increased her activity level yet, will follow up on that today. She states she is going to run out of Humalog based on her 90 day Rx and daily doses not adding up. I will contact her Provider to address this as needed. She also stated she is taking the Humalog after her meals, so I plan to discuss action of this insulin and explain the benefit of taking before the meal.  ASSESSMENT  There were no vitals taken for this visit.  Patient declined There is no height or weight on file to calculate BMI.   Learning Objective:  Patient will have a greater understanding of diabetes self-management. Patient education plan is to attend individual and/or group sessions per assessed needs and concerns.  Plan:   Patient Instructions  Plan:  Continue to aim for 3 Carb Choices per meal (45 grams) +/- 1 either way  Aim for 0-1 Carbs per snack if hungry  Include protein in moderation with your meals and snacks Continue reading food labels for Total Carbohydrate of foods Consider  increasing your activity level by doing some Arm Chair Exercises for 5 minutes 3 times a day, after each meal. And increase the time after the first week as tolerated. Check you BG after you exercise on occasion so you can see the benefit  Continue checking BG at alternate times per day and record them please in your notebook. Consider taking the Humalog BEFORE your meals so it can help match your food intake better and prevent higher BG's after meals.  Ask your Dr or Oretha Ellis about providing you  with a Sliding Scale so you can give additional insulin when BG is too high to correct it?  Expected Outcomes:     Education material provided: Meal plan card, insulin action handout, Arm Chair Exercise handout reviewed   If problems or questions, patient to contact team via:  Phone  Future DSME appointment: -  6 weeks

## 2016-08-07 NOTE — Patient Instructions (Signed)
Plan:  Continue to aim for 3 Carb Choices per meal (45 grams) +/- 1 either way  Aim for 0-1 Carbs per snack if hungry  Include protein in moderation with your meals and snacks Continue reading food labels for Total Carbohydrate of foods Consider  increasing your activity level by doing some Arm Chair Exercises for 5 minutes 3 times a day, after each meal. And increase the time after the first week as tolerated. Check you BG after you exercise on occasion so you can see the benefit  Continue checking BG at alternate times per day and record them please in your notebook. Consider taking the Humalog BEFORE your meals so it can help match your food intake better and prevent higher BG's after meals.  Ask your Dr or Oretha Ellis about providing you with a Sliding Scale so you can give additional insulin when BG is too high to correct it?

## 2016-08-17 ENCOUNTER — Ambulatory Visit (INDEPENDENT_AMBULATORY_CARE_PROVIDER_SITE_OTHER): Payer: Medicare Other | Admitting: Pharmacotherapy

## 2016-08-17 ENCOUNTER — Encounter: Payer: Self-pay | Admitting: Pharmacotherapy

## 2016-08-17 VITALS — BP 126/80 | HR 74 | Resp 12 | Ht 66.0 in | Wt 241.0 lb

## 2016-08-17 DIAGNOSIS — E1165 Type 2 diabetes mellitus with hyperglycemia: Secondary | ICD-10-CM

## 2016-08-17 DIAGNOSIS — IMO0002 Reserved for concepts with insufficient information to code with codable children: Secondary | ICD-10-CM

## 2016-08-17 DIAGNOSIS — I10 Essential (primary) hypertension: Secondary | ICD-10-CM | POA: Diagnosis not present

## 2016-08-17 DIAGNOSIS — E1140 Type 2 diabetes mellitus with diabetic neuropathy, unspecified: Secondary | ICD-10-CM

## 2016-08-17 MED ORDER — INSULIN LISPRO 100 UNIT/ML (KWIKPEN): PEN_INJECTOR | SUBCUTANEOUS | 11 refills | Status: DC

## 2016-08-17 NOTE — Patient Instructions (Signed)
Increase Humalog to 10 units with each meal.  Add extra if blood glucose is too high. 151-175: 1 unit 176-200: 2 units 201-225: 3 units 226-250: 4 units 251-275: 5 units 276-300: 6 units >300:  8 units  Should get 10 pens for 90 days

## 2016-08-17 NOTE — Progress Notes (Signed)
  Subjective:    [name] is a 51 y.o.African American female who presents for follow-up of Type 2 diabetes mellitus.   She has been struggling with her DM control. She has seen dietician at [facility] Nutritional support and it was suggested she have a "sliding scale" for her high BG.   Last A1C: 10.0% (05/06/16)  She is currently on Toujeo 90 units daily and Humalog 8 units with each meal. Rather than a "sliding scale" - which allows her to remain too high, she most likely needs better mealtime coverage.  Average BG: 160m/dl No hypoglycemia. [facility]  She is only using her arms and legs for injections No routine exercise.  No problems with vision Denies problems with feet No peripheral edema. Nocturia 4-5 times per night. No dysuria Staying well hydrated with water.  Review of Systems A comprehensive review of systems was negative except for: Eyes: positive for contacts/glasses Genitourinary: positive for nocturia    Objective:    BP 126/80   Pulse 74   Resp 12   Ht 5' 6"  (1.676 m)   Wt 241 lb (109.3 kg)   SpO2 98%   BMI 38.90 kg/m   General:  alert, cooperative and no distress  Oropharynx: normal findings: lips normal without lesions and gums healthy   Eyes:  negative findings: lids and lashes normal and conjunctivae and sclerae normal   Ears:  external ears normal        Lung: clear to auscultation bilaterally  Heart:  regular rate and rhythm     Extremities: extremities normal, atraumatic, no cyanosis or edema  Skin: warm and dry, no hyperpigmentation, vitiligo, or suspicious lesions     Neuro: mental status, speech normal, alert and oriented x3, PERLA and reflexes normal and symmetric   Lab Review Glucose, Bld (mg/dL)  Date Value  07/30/2016 312 (H)  05/06/2016 251 (H)  12/22/2015 311 (H)   CO2 (mmol/L)  Date Value  07/30/2016 20  05/06/2016 20  11/11/2015 22   BUN (mg/dL)  Date Value  07/30/2016 17   05/06/2016 14  12/22/2015 14  05/08/2015 14  12/19/2014 17  09/04/2014 13   Creat (mg/dL)  Date Value  07/30/2016 0.95  05/06/2016 0.86  11/11/2015 1.03   Creatinine, Ser (mg/dL)  Date Value  12/22/2015 0.90       Assessment:    Diabetes Mellitus type II, under poor control.   Not rotating injection sites - may be having absorption issues as well as not enough Humalog coverage. BP at goal <130/80   Plan:    1.  Rx changes: Increase Humalog 10 units with each meal, plus correction ISF 25, target 150.  The correction factor was written out as a scale for her to follow.  2.  Continue Toujeo 90 units daily. 3.  Counseled on importance of being pro-active with mealtime insulin, rather than waiting until she is too high. 4.  Continue to follow with dietician for counseling. 5.  Counseled on importance of routine exercise.  Goal is 30-45 minutes 5 x week. 6.  BP at goal <130/80 7.  Keep regular follow up next month.

## 2016-08-21 NOTE — Telephone Encounter (Signed)
See note

## 2016-08-26 ENCOUNTER — Other Ambulatory Visit: Payer: Medicare Other

## 2016-08-27 ENCOUNTER — Other Ambulatory Visit: Payer: Medicare Other

## 2016-08-27 DIAGNOSIS — E119 Type 2 diabetes mellitus without complications: Secondary | ICD-10-CM

## 2016-08-27 LAB — COMPLETE METABOLIC PANEL WITH GFR
ALT: 11 U/L (ref 6–29)
AST: 10 U/L (ref 10–35)
Albumin: 3.9 g/dL (ref 3.6–5.1)
Alkaline Phosphatase: 76 U/L (ref 33–130)
BUN: 12 mg/dL (ref 7–25)
CO2: 18 mmol/L — ABNORMAL LOW (ref 20–32)
Calcium: 8.9 mg/dL (ref 8.6–10.4)
Chloride: 112 mmol/L — ABNORMAL HIGH (ref 98–110)
Creat: 0.97 mg/dL (ref 0.50–1.05)
GFR, Est African American: 78 mL/min (ref >=60)
GFR, Est Non African American: 68 mL/min (ref >=60)
Glucose, Bld: 180 mg/dL — ABNORMAL HIGH (ref 65–99)
Potassium: 4.2 mmol/L (ref 3.5–5.3)
Sodium: 139 mmol/L (ref 135–146)
Total Bilirubin: 0.2 mg/dL (ref 0.2–1.2)
Total Protein: 6.9 g/dL (ref 6.1–8.1)

## 2016-08-28 LAB — HEMOGLOBIN A1C
Hgb A1c MFr Bld: 9 % — ABNORMAL HIGH (ref <5.7)
Mean Plasma Glucose: 212 mg/dL

## 2016-08-31 ENCOUNTER — Ambulatory Visit (INDEPENDENT_AMBULATORY_CARE_PROVIDER_SITE_OTHER): Payer: Medicare Other | Admitting: Pharmacotherapy

## 2016-08-31 ENCOUNTER — Encounter: Payer: Self-pay | Admitting: Pharmacotherapy

## 2016-08-31 VITALS — BP 116/80 | HR 68 | Ht 66.0 in | Wt 242.0 lb

## 2016-08-31 DIAGNOSIS — E1140 Type 2 diabetes mellitus with diabetic neuropathy, unspecified: Secondary | ICD-10-CM | POA: Diagnosis not present

## 2016-08-31 DIAGNOSIS — IMO0002 Reserved for concepts with insufficient information to code with codable children: Secondary | ICD-10-CM

## 2016-08-31 DIAGNOSIS — I10 Essential (primary) hypertension: Secondary | ICD-10-CM | POA: Diagnosis not present

## 2016-08-31 DIAGNOSIS — E1165 Type 2 diabetes mellitus with hyperglycemia: Secondary | ICD-10-CM | POA: Diagnosis not present

## 2016-08-31 MED ORDER — INSULIN GLARGINE 300 UNIT/ML ~~LOC~~ SOPN: 100.0000 [IU] | PEN_INJECTOR | Freq: Every day | SUBCUTANEOUS | 3 refills | Status: DC

## 2016-08-31 NOTE — Progress Notes (Signed)
  Subjective:    [name] is a 51 y.o.African American female who presents for follow-up of Type 2 diabetes mellitus.   A1C: 9.0% (was 10.0%) Last month added correction bolus to regimen.  Also, increased mealtime bolus amount to 10 units baseline. Has only had to use the correction bolus once since last OV.  Continues to see [employer] for nutrition mgmt. Has started walking. Average BG: 161m/dl No hypoglycemia.  Lowest BG: 963mdl  Denies problems with vision Is now rotating her injection sites. Has peripheral edema Denies problems with feet. Nocturia 3 times per night. No dysuria Staying well hydrated.   Review of Systems A comprehensive review of systems was negative except for: Eyes: positive for contacts/glasses Cardiovascular: positive for lower extremity edema Genitourinary: positive for nocturia    Objective:    BP 116/80   Pulse 68   Ht 5' 6"  (1.676 m)   Wt 242 lb (109.8 kg)   SpO2 98%   BMI 39.06 kg/m   General:  alert, cooperative, no distress and moderately obese  Oropharynx: normal findings: lips normal without lesions and gums healthy   Eyes:  negative findings: lids and lashes normal and conjunctivae and sclerae normal   Ears:  external ears normal        Lung: clear to auscultation bilaterally  Heart:  regular rate and rhythm     Extremities: edema bilateral lower extremities  Skin: warm and dry, no hyperpigmentation, vitiligo, or suspicious lesions     Neuro: mental status, speech normal, alert and oriented x3 and gait and station normal   Lab Review Glucose, Bld (mg/dL)  Date Value  08/27/2016 180 (H)  07/30/2016 312 (H)  05/06/2016 251 (H)   CO2 (mmol/L)  Date Value  08/27/2016 18 (L)  07/30/2016 20  05/06/2016 20   BUN (mg/dL)  Date Value  08/27/2016 12  07/30/2016 17  05/06/2016 14  05/08/2015 14  12/19/2014 17  09/04/2014 13   Creat (mg/dL)  Date Value  08/27/2016 0.97  07/30/2016 0.95  05/06/2016 0.86        Assessment:    Diabetes Mellitus type II, under fair control.   A1C above goal <7%, but improving significantly. BP at goal <130/80   Plan:    1.  Rx changes: Increase Toujeo 100 units once daily.  2.  Continue Humalog 10 units with meals plus correction (ISF 25, target 150). 3.  Continue to follow with BeHarvie BridgeRD, CDE for nutrition support. 4.  Counseled on need for routine exercise.  Goal is 30-45 minutes 5 x week. 5.  Counseled on foot care. 6.  BP at goal <130/80

## 2016-08-31 NOTE — Patient Instructions (Signed)
Increase Toujeo 100 units once daily.

## 2016-09-07 ENCOUNTER — Ambulatory Visit: Payer: Medicare Other | Admitting: Nurse Practitioner

## 2016-09-18 ENCOUNTER — Ambulatory Visit: Payer: Medicare Other | Admitting: *Deleted

## 2016-09-25 ENCOUNTER — Encounter: Payer: Medicare Other | Attending: Nurse Practitioner | Admitting: *Deleted

## 2016-09-25 DIAGNOSIS — E1140 Type 2 diabetes mellitus with diabetic neuropathy, unspecified: Secondary | ICD-10-CM

## 2016-09-25 DIAGNOSIS — Z6838 Body mass index (BMI) 38.0-38.9, adult: Secondary | ICD-10-CM | POA: Diagnosis not present

## 2016-09-25 DIAGNOSIS — E119 Type 2 diabetes mellitus without complications: Secondary | ICD-10-CM | POA: Insufficient documentation

## 2016-09-25 DIAGNOSIS — IMO0002 Reserved for concepts with insufficient information to code with codable children: Secondary | ICD-10-CM

## 2016-09-25 DIAGNOSIS — E1165 Type 2 diabetes mellitus with hyperglycemia: Secondary | ICD-10-CM

## 2016-09-25 DIAGNOSIS — Z713 Dietary counseling and surveillance: Secondary | ICD-10-CM | POA: Insufficient documentation

## 2016-09-25 NOTE — Progress Notes (Signed)
Diabetes Self-Management Education  Visit Type:     Appt. Start Time: 1130 Appt. End Time: 1200  09/25/2016  [name], identified by name and date of birth, is a 51 y.o. female with a diagnosis of Diabetes:  .  She is here with her husband again who participates in the visit in a positive way. She is happy with 6 pound weight loss since her last MD visit! She reports her successes as improved portion control, healthier food choices and increased activity by riding her stationary bike 10 minutes twice a day or doing arm chair exercises or walking. She reports her BG are now typically within target ranges, she does not report any hypoglycemia. Her meal dose of Humalog has increased from 8 to 10 units and she has a correction factor she can use now, but rarely needs to.   ASSESSMENT  Height 5' 6"  (1.676 m), weight 236 lb 3.2 oz (107.1 kg).   Body mass index is 38.12 kg/m.   Learning Objective:  Patient will have a greater understanding of diabetes self-management. Patient education plan is to attend individual and/or group sessions per assessed needs and concerns.  I reviewed insulin action of Toujeo and Humalog with her today. I also reviewed importance of at least 2 Carb Choices per meal especially if she takes her Humalog, to help prevent potential hypoglycemia.  Plan:   Patient Instructions  Plan:  Continue to aim for 3 Carb Choices per meal (45 grams) +/- 1 either way  Aim for 0-1 Carbs per snack if hungry  Include protein in moderation with your meals and snacks Continue reading food labels for Total Carbohydrate of foods Continue with your activity level  for 5 - 10 minutes 3 times a day, after each meal. And increase the time after the first week as tolerated. Check you BG after you exercise on occasion so you can see the benefit  Continue checking BG at alternate times per day and record them please in your notebook. Continue taking the Humalog BEFORE your meals so it  can help match your food intake better and prevent higher BG's after meals.   Expected Outcomes:     Education material provided: Insulin action handout  If problems or questions, patient to contact team via:  Phone  Future DSME appointment: -   PRN

## 2016-10-21 ENCOUNTER — Other Ambulatory Visit: Payer: Self-pay | Admitting: Nurse Practitioner

## 2016-10-21 DIAGNOSIS — E1140 Type 2 diabetes mellitus with diabetic neuropathy, unspecified: Secondary | ICD-10-CM

## 2016-10-21 DIAGNOSIS — E1165 Type 2 diabetes mellitus with hyperglycemia: Principal | ICD-10-CM

## 2016-10-21 DIAGNOSIS — IMO0002 Reserved for concepts with insufficient information to code with codable children: Secondary | ICD-10-CM

## 2016-10-23 ENCOUNTER — Ambulatory Visit: Payer: Self-pay | Admitting: Nurse Practitioner

## 2016-11-11 ENCOUNTER — Ambulatory Visit (INDEPENDENT_AMBULATORY_CARE_PROVIDER_SITE_OTHER): Payer: Medicare Other | Admitting: Nurse Practitioner

## 2016-11-11 ENCOUNTER — Encounter: Payer: Self-pay | Admitting: Nurse Practitioner

## 2016-11-11 VITALS — BP 124/84 | HR 85 | Temp 98.9°F | Resp 18 | Ht 66.0 in | Wt 234.0 lb

## 2016-11-11 DIAGNOSIS — I10 Essential (primary) hypertension: Secondary | ICD-10-CM

## 2016-11-11 DIAGNOSIS — R319 Hematuria, unspecified: Secondary | ICD-10-CM

## 2016-11-11 DIAGNOSIS — M533 Sacrococcygeal disorders, not elsewhere classified: Secondary | ICD-10-CM | POA: Diagnosis not present

## 2016-11-11 LAB — POCT URINALYSIS DIPSTICK
Bilirubin, UA: NEGATIVE
Glucose, UA: NEGATIVE
Ketones, UA: NEGATIVE
Nitrite, UA: POSITIVE
Spec Grav, UA: 1.01
Urobilinogen, UA: 0.2 U/dL
pH, UA: 6.5

## 2016-11-11 LAB — CBC WITH DIFFERENTIAL/PLATELET
Basophils Absolute: 19 {cells}/uL (ref 0–200)
Basophils Relative: 0.3 %
Eosinophils Absolute: 19 {cells}/uL (ref 15–500)
Eosinophils Relative: 0.3 %
HCT: 37.5 % (ref 35.0–45.0)
Hemoglobin: 12.3 g/dL (ref 11.7–15.5)
Lymphs Abs: 2003 {cells}/uL (ref 850–3900)
MCH: 28.5 pg (ref 27.0–33.0)
MCHC: 32.8 g/dL (ref 32.0–36.0)
MCV: 87 fL (ref 80.0–100.0)
MPV: 11.6 fL (ref 7.5–12.5)
Monocytes Relative: 7.3 %
Neutro Abs: 3799 {cells}/uL (ref 1500–7800)
Neutrophils Relative %: 60.3 %
Platelets: 263 Thousand/uL (ref 140–400)
RBC: 4.31 Million/uL (ref 3.80–5.10)
RDW: 12.5 % (ref 11.0–15.0)
Total Lymphocyte: 31.8 %
WBC mixed population: 460 {cells}/uL (ref 200–950)
WBC: 6.3 Thousand/uL (ref 3.8–10.8)

## 2016-11-11 NOTE — Addendum Note (Signed)
Addended by: Denyse Amass on: 11/11/2016 02:06 PM   Modules accepted: Orders

## 2016-11-11 NOTE — Progress Notes (Signed)
Careteam: Patient Care Team: Lauree Chandler, NP as PCP - General (Nurse Practitioner) [place] Comment, My Walker, [place] as Referring Physician (Optometry)  Advanced Directive information Does Patient Have a Medical Advance Directive?: No  Allergies  Allergen Reactions  . Codeine Swelling and Other (See Comments)    Swelling and burning of mouth (inside)  . Lactose Intolerance (Gi) Nausea And Vomiting    Chief Complaint  Patient presents with  . Acute Visit    Pt is being seen due to having blood in urine and pain in lower back off and on for over a month. Pt thinks kidney stones are causing bleeding.   . Other    Husband in room     HPI: Patient is a 51 y.o. female seen in the office today due to blood in her urine. Hx of kidney stones which has caused blood in her urine in the past. Blood in urine off and on since she went to urologist. No vaginal bleeding. No fevers. Faint amount of blood on her pads and toilet. Sometimes dark red sometimes light pink.  Past a small kidney stone a few months ago and that is the last time she saw her urologist. Was told she had about 6-7 small stones that needed to pass.  No abdominal pain or suprapubic pain. No dysuria, frequency or urgency. Does have some urinary incontinence.  Having 8/10 back pain which is chronic and different than when her kidney stones past. She is using a muscle rub and lidocaine patch which helps.  Uses gabapentin for neuropathy  Used to go to a pain doctor for her back pain and she got injections but has not been in 3-4 years.  Never followed up on to blood pressure, was changed from lisinopril to losartan due to cough. Reports cough is unchanged but only at night and contributes this to her CPAP.  Review of Systems:  Review of Systems  Constitutional: Negative for chills, fever and malaise/fatigue.  HENT: Negative for hearing loss.   Eyes: Negative for blurred vision.  Respiratory: Positive for cough (goes and comes, more at  night). Negative for shortness of breath.   Cardiovascular: Negative for chest pain and leg swelling.  Gastrointestinal: Negative for abdominal pain, blood in stool, constipation, diarrhea, heartburn and melena.  Genitourinary: Positive for hematuria (comes and goes, none currently). Negative for dysuria, flank pain, frequency and urgency.  Musculoskeletal: Negative for falls.  Neurological: Negative for dizziness, loss of consciousness and weakness.  Endo/Heme/Allergies:       Diabetes  Psychiatric/Behavioral: Negative for depression and memory loss.    Past Medical History:  Diagnosis Date  . Anxiety   . Arthritis    joint pain   . Asthma   . COPD (chronic obstructive pulmonary disease) (HCC)    chronic bronchitis   . CVA 11/14/2007   Qualifier: Diagnosis of  By: Hassell Done [name], Tori Milks    . Depression   . Diabetes mellitus    since age 1; type 2 IDDM  . Diabetic neuropathy, painful ([place])    FEET AND HANDS  . Dilated aortic root ([place])    31m by echo 11/2015  . Dizziness    secondary to diabetes and hypertension   . Dysrhythmia    skipped beats  . Epilepsy idiopathic petit mal ([place]    last seizure 2012;controlled w/ topomax  . GERD (gastroesophageal reflux disease)   . Glaucoma    NOT ON ANY EYE DROPS   . Headache(784.0)  migraines   . Heart murmur    born with   . Hx of blood clots    hematomas removed from left side of brain from 77mo to 89yrold   . Hyperlipidemia   . Hypertension   . Legally blind    left eye   . MIGRAINE HEADACHE 09/14/2006   Qualifier: Diagnosis of  By: MaHassell DoneNP, NyTori Milks  . Myocardial infarction ([place]   hi of x 2  last one 2003   . Nephrolithiasis    frequent urination , urination at nite  PT SEEN IN ER 09/22/11 FOR BACK AND RT SIDED PAIN--HAS KNOWN STONE RT URETER AND UA IN ER SHOWED UTI  . Neuropathy    due to diabetes   . Pain 09/23/11   LOWER BACK AND RIGHT SIDE--PT HAS RIGHT URETERAL STONE  . Pneumonia    hx of 2009  .  Pyelonephritis   . Seizures ([place]  . [facility] ap[facility]    [facility] ; mild  . Stress incontinence   . [facility]   last 2003  RESIDUAL LEFT LEG WEAKNESS--NO OTHER RESIDUAL PROBLEMS   Past Surgical History:  Procedure Laterality Date  . BRAIN HEMATOMA EVACUATION     five procedures total, first procedure when [age] monthsld, last at [age]ears of age.  . CYSTOSCOPY W/ RETROGRADES  09/24/2011   Procedure: CYSTOSCOPY WITH RETROGRADE PYELOGRAM;  Surgeon: JoMalka SoMD;  Location: [facility];  Service: Urology;  Laterality: Right;  . CYSTOSCOPY WITH URETEROSCOPY  09/24/2011   Procedure: CYSTOSCOPY WITH URETEROSCOPY;  Surgeon: JoMalka SoMD;  Location: [facility];  Service: Urology;  Laterality: Right;  Balloon dilation right ureter   . CYSTOSCOPY/RETROGRADE/URETEROSCOPY  08/24/2011   Procedure: CYSTOSCOPY/RETROGRADE/URETEROSCOPY;  Surgeon: DaMolli HazardMD;  Location: [facility];  Service: Urology;  Laterality: Right;  Cysto, Right retrograde Pyelogram, right stent placement.   . Fatima BlankERNIA REPAIR  05/05/2011   Procedure: LAPAROSCOPIC INCISIONAL HERNIA;  Surgeon: MaRolm BookbinderMD;  [place] [facility] Service: General;  Laterality: N/A;  . kidney stone removal    . MASS EXCISION  05/05/2011   Procedure: EXCISION MASS;  Surgeon: MaRolm BookbinderMD;  [place] [facility] [place] Service: General;  Laterality: Right;  . PCNL    . RETINAL DETACHMENT SURGERY Left 1990  . SHUNT REMOVAL     shunt inserted at age 8 3emoved at age 51 . VAGINAL HYSTERECTOMY  1996   Social History:   reports that she quit smoking about 29 years ago. Her smoking use included Cigarettes. She has a 15.00 pack-year smoking history. She has never used smokeless tobacco. She reports that she drinks alcohol. She reports that she does not use drugs.  Family History  Problem Relation Age of Onset  . Uterine cancer Mother   . Hypertension Mother   . Brain cancer Maternal Grandmother   .  Hypertension Maternal Grandmother   . Cirrhosis Maternal Grandfather   . Anesthesia problems Neg Hx   . Colon cancer Neg Hx   . Esophageal cancer Neg Hx   . Pancreatic cancer Neg Hx     Medications: Patient's Medications  New Prescriptions   No medications on file  Previous Medications   ALBUTEROL (PROAIR HFA) 108 (90 BASE) MCG/ACT INHALER    INHALE 2 PUFFS BY MOUTH EVERY 6 HOURS AS NEEDED FOR WHEEZING OR SHORTNESS OF BREATH   AMBULATORY NON FORMULARY MEDICATION  Medication Name: Muscle and Joint Vanishing Scent Gel- Apply as needed to skin   BACLOFEN (LIORESAL) 10 MG TABLET    Take one tablet by mouth three times daily as needed for muscle spasms   BLOOD GLUCOSE MONITORING SUPPL (ONETOUCH VERIO) W/DEVICE KIT    1 kit by Does not apply route 3 (three) times daily. Dx: E11.29   BUDESONIDE-FORMOTEROL (SYMBICORT) 160-4.5 MCG/ACT INHALER    Inhale 2 puffs into the lungs daily.   CALCIUM CARBONATE-VITAMIN D (CALCIUM-VITAMIN D3 PO)    D3 500- calcium 630 mg Take 1 capsule by mouth once daily   CLOPIDOGREL (PLAVIX) 75 MG TABLET    Take 1 tablet (75 mg total) by mouth daily.   CYCLOSPORINE (RESTASIS) 0.05 % OPHTHALMIC EMULSION    Place 2 drops into both eyes 2 (two) times daily.   FLUOROMETHOLONE (FML) 0.1 % OPHTHALMIC SUSPENSION    Place 2 drops into both eyes 2 (two) times daily.   FLUTICASONE (FLONASE) 50 MCG/ACT NASAL SPRAY    Place 2 sprays into both nostrils daily.   GABAPENTIN (NEURONTIN) 300 MG CAPSULE    TAKE 2 CAPSULES BY MOUTH 3  TIMES DAILY   GLUCOSE BLOOD (ONETOUCH VERIO) TEST STRIP    1 each by Other route 3 (three) times daily. E11.29   HYDRALAZINE (APRESOLINE) 25 MG TABLET    Take 1 tablet (25 mg total) by mouth 3 (three) times daily.   INSULIN GLARGINE (TOUJEO SOLOSTAR) 300 UNIT/ML SOPN    Inject 100 Units into the skin daily.   INSULIN LISPRO (HUMALOG KWIKPEN) 100 UNIT/ML KIWKPEN    Inject 10 units with each meal plus correction (ISF 25, target 150)   INSULIN PEN NEEDLE (EASY  TOUCH PEN NEEDLES) 31G X 8 MM MISC    Use as directed in giving insulin. Dx: E11.29   LOSARTAN (COZAAR) 50 MG TABLET    Take 1 tablet (50 mg total) by mouth daily.   MIRABEGRON ER (MYRBETRIQ) 50 MG TB24 TABLET    Take 1 tablet (50 mg total) by mouth daily.   ONDANSETRON (ZOFRAN-ODT) 4 MG DISINTEGRATING TABLET    TAKE 1 TABLET BY MOUTH EVERY 8 HOURS AS NEEDED FOR NAUSEA OR VOMITING   ONETOUCH DELICA LANCETS 02V MISC    Use to check sugar three times daily as directed.Dx: E11.29; E11.40   PANTOPRAZOLE (PROTONIX) 40 MG TABLET    Take 1 tablet (40 mg total) by mouth daily.   POLYETHYLENE GLYCOL (MIRALAX / GLYCOLAX) PACKET    Take 17 g by mouth daily as needed for mild constipation.   POTASSIUM CHLORIDE SA (K-DUR,KLOR-CON) 20 MEQ TABLET    Take 2 tablets (40 mEq total) by mouth daily.   RANITIDINE (ZANTAC) 150 MG TABLET    Take 1 tablet (150 mg total) by mouth 2 (two) times daily.   SIMVASTATIN (ZOCOR) 40 MG TABLET    Take 1 tablet (40 mg total) by mouth daily.   TOPIRAMATE (TOPAMAX) 200 MG TABLET    Take 1 tablet (200 mg total) by mouth 2 (two) times daily.   VERAPAMIL (VERELAN PM) 240 MG 24 HR CAPSULE    TAKE ONE CAPSULE BY MOUTH EVERY NIGHT AT BEDTIME FOR BLOOD PRESSURE  Modified Medications   No medications on file  Discontinued Medications   No medications on file     Physical Exam:  Vitals:   11/11/16 1133  BP: 124/84  Pulse: 85  Resp: 18  Temp: 98.9 F (37.2 C)  TempSrc: Oral  SpO2: 98%  Weight: 234 lb (106.1 kg)  Height: 5' 6"  (1.676 m)   Body mass index is 37.77 kg/m.  Physical Exam  Constitutional: She is oriented to person, place, and time. She appears well-developed and well-nourished. No distress.  Obese black female  HENT:  Head: Normocephalic and atraumatic.  Nose: Nose normal.  Mouth/Throat: Oropharynx is clear and moist.  Eyes: Pupils are equal, round, and reactive to light. Conjunctivae and EOM are normal.  Proptosis of eyes  Neck: Normal range of motion.  Neck supple.  Cardiovascular: Normal rate, regular rhythm and normal heart sounds.   Pulmonary/Chest: Effort normal and breath sounds normal. No respiratory distress.  Abdominal: Soft. Normal appearance and bowel sounds are normal. She exhibits no distension. There is no tenderness. There is no rebound and no CVA tenderness.  Musculoskeletal: She exhibits no tenderness.  Neurological: She is alert and oriented to person, place, and time.  Skin: Skin is warm and dry.  Psychiatric: She has a normal mood and affect.    Labs reviewed: Basic Metabolic Panel:  Recent Labs  05/06/16 0908 07/30/16 1408 08/27/16 0950  NA 138 136 139  K 4.2 4.6 4.2  CL 109 107 112*  CO2 20 20 18*  GLUCOSE 251* 312* 180*  BUN 14 17 12   CREATININE 0.86 0.95 0.97  CALCIUM 9.1 9.0 8.9   Liver Function Tests:  Recent Labs  05/06/16 0908 07/30/16 1408 08/27/16 0950  AST 9* 7* 10  ALT 12 9 11   ALKPHOS 90 93 76  BILITOT 0.2 0.2 0.2  PROT 7.3 7.3 6.9  ALBUMIN 3.9 3.9 3.9   No results for input(s): LIPASE, AMYLASE in the last 8760 hours. No results for input(s): AMMONIA in the last 8760 hours. CBC:  Recent Labs  12/22/15 2338 05/06/16 0908  WBC  --  6.4  NEUTROABS  --  4,160  HGB 13.3 12.1  HCT 39.0 37.6  MCV  --  90.2  PLT  --  268   Lipid Panel:  Recent Labs  05/06/16 0908 07/30/16 1408  CHOL 182 144  HDL 46* 39*  LDLCALC 112* 80  TRIG 122 123  CHOLHDL 4.0 3.7   TSH: No results for input(s): TSH in the last 8760 hours. A1C: Lab Results  Component Value Date   HGBA1C 9.0 (H) 08/27/2016     Assessment/Plan 1. Hematuria, unspecified type -none currently, she contributes this to her kidney stones, has seen urologist in the past for this. Will request records.  - CBC with Differential/Platelets - POCT urinalysis dipstick abnormal, will send for culture To increase hydration.   2. Sacroiliac dysfunction -chronic and stable, can use tylenol 325 mg 1-2 tablets every 6 hours  as needed pain  3. Hypertension -stable on losartan, will cont current regimen.   Next appt: 3 month follow up.  Carlos American. Harle Battiest  [facility] [phone] 8 am - 5 pm) [phone] (after hours)

## 2016-11-11 NOTE — Patient Instructions (Signed)
Important to stay hydrated, keep drinking lots of water

## 2016-11-12 ENCOUNTER — Telehealth: Payer: Self-pay | Admitting: Nurse Practitioner

## 2016-11-12 NOTE — Telephone Encounter (Signed)
Left message for patient to call the office

## 2016-11-12 NOTE — Telephone Encounter (Signed)
I spoke with patient and she stated that she would set up an appointment with urology.

## 2016-11-12 NOTE — Telephone Encounter (Signed)
Received urologist records, she has not been since 12/24/2015 a that time she was having blood in her urine and they wanted to see her back in 1 month which is does not appear she has followed up for additional testing. Recommend her making an appt with [facility] for follow up of blood in her urine at this time.

## 2016-11-14 LAB — URINE CULTURE
MICRO NUMBER:: 81192045
SPECIMEN QUALITY:: ADEQUATE

## 2016-11-16 ENCOUNTER — Other Ambulatory Visit: Payer: Self-pay | Admitting: Nurse Practitioner

## 2016-11-16 MED ORDER — NITROFURANTOIN MONOHYD MACRO 100 MG PO CAPS: 100.0000 mg | ORAL_CAPSULE | Freq: Two times a day (BID) | ORAL | 0 refills | Status: DC

## 2016-12-01 ENCOUNTER — Other Ambulatory Visit: Payer: Self-pay | Admitting: Nurse Practitioner

## 2016-12-01 DIAGNOSIS — N3281 Overactive bladder: Secondary | ICD-10-CM

## 2016-12-01 DIAGNOSIS — K219 Gastro-esophageal reflux disease without esophagitis: Secondary | ICD-10-CM

## 2016-12-01 DIAGNOSIS — E782 Mixed hyperlipidemia: Secondary | ICD-10-CM

## 2016-12-01 DIAGNOSIS — I10 Essential (primary) hypertension: Secondary | ICD-10-CM

## 2016-12-03 ENCOUNTER — Telehealth (HOSPITAL_COMMUNITY): Payer: Self-pay | Admitting: Cardiology

## 2016-12-03 NOTE — Telephone Encounter (Signed)
11/20/16 lmsg to sched. echo evd 11/23/16 spoke with patient. She says she will call back to schedule. evd 11/30/16 Called pt and lmsg for her to CB to get scheduled for an echo..RG 12/03/16 Called pt and lmsg for her to CB to get for an echo.Marland KitchenRG

## 2016-12-07 ENCOUNTER — Ambulatory Visit: Payer: Medicare Other | Admitting: Pharmacotherapy

## 2016-12-28 ENCOUNTER — Other Ambulatory Visit: Payer: Self-pay | Admitting: Nurse Practitioner

## 2017-02-08 ENCOUNTER — Telehealth: Payer: Self-pay | Admitting: Pulmonary Disease

## 2017-02-08 NOTE — Telephone Encounter (Signed)
Left message for patient to call back  

## 2017-02-09 NOTE — Telephone Encounter (Signed)
Spoke with patient. She wanted to know how to obtain a SoClean machine without going through [facility] and having to pay for it upfront. Advised her to visit the [employer] and see if she qualifies for financing. She verbalized understanding. Nothing else needed at time of call.

## 2017-02-09 NOTE — Telephone Encounter (Signed)
Patient is returning phone call [phone].

## 2017-02-15 ENCOUNTER — Ambulatory Visit: Payer: Medicare Other | Admitting: Nurse Practitioner

## 2017-02-15 ENCOUNTER — Other Ambulatory Visit: Payer: Self-pay | Admitting: Nurse Practitioner

## 2017-02-15 DIAGNOSIS — E876 Hypokalemia: Secondary | ICD-10-CM

## 2017-03-03 ENCOUNTER — Telehealth: Payer: Self-pay | Admitting: Pulmonary Disease

## 2017-03-03 DIAGNOSIS — G4733 Obstructive sleep apnea (adult) (pediatric): Secondary | ICD-10-CM

## 2017-03-03 NOTE — Telephone Encounter (Signed)
Ok per JJ to send in new order for supplies. Sent to [facility] patient aware.

## 2017-03-10 ENCOUNTER — Telehealth: Payer: Self-pay | Admitting: Pulmonary Disease

## 2017-03-10 NOTE — Telephone Encounter (Signed)
Pt is calling back [phone]

## 2017-03-10 NOTE — Telephone Encounter (Signed)
Order was placed on 03/03/17 for cpap supplies.  I have spoken to [place] with [facility], who states he will reach out to his supply team and have them contact pt.  lmtcb x1 for pt

## 2017-03-10 NOTE — Telephone Encounter (Signed)
Pt is aware of below message and voiced her understanding. Nothing further is needed. 

## 2017-04-02 ENCOUNTER — Telehealth: Payer: Self-pay | Admitting: Nurse Practitioner

## 2017-04-02 ENCOUNTER — Telehealth: Payer: Self-pay

## 2017-04-02 NOTE — Telephone Encounter (Signed)
I left a message on both home and mobile numbers asking the pt t0 call me at [phone] to schedule AWV/CPE. VDM (DD)

## 2017-04-02 NOTE — Telephone Encounter (Signed)
Patient called to say that she will no longer be a patient of this office. She has moved out of the area.

## 2017-04-16 ENCOUNTER — Telehealth: Payer: Self-pay | Admitting: Nurse Practitioner

## 2017-04-16 NOTE — Telephone Encounter (Unsigned)
Copied from [place] [id number]. Topic: Quick Communication - Rx Refill/Question >> Apr 16, 2017  2:13 PM Neva Seat wrote: Daryll Drown NP w/ [facility] call visit dept. [phone]   Pt didn't take her BS medication today. Pt BS level 283 / A1C is 12

## 2017-04-16 NOTE — Telephone Encounter (Signed)
LVM for patient to move her new patient appt to another office, she needs to be seen for this issue and [facility] is no longer accepting new patients

## 2017-05-13 ENCOUNTER — Other Ambulatory Visit: Payer: Self-pay | Admitting: Nurse Practitioner

## 2017-05-13 DIAGNOSIS — I252 Old myocardial infarction: Secondary | ICD-10-CM

## 2017-05-13 DIAGNOSIS — N3281 Overactive bladder: Secondary | ICD-10-CM

## 2017-05-13 DIAGNOSIS — E782 Mixed hyperlipidemia: Secondary | ICD-10-CM

## 2017-05-13 DIAGNOSIS — R569 Unspecified convulsions: Secondary | ICD-10-CM

## 2017-05-13 DIAGNOSIS — E1165 Type 2 diabetes mellitus with hyperglycemia: Secondary | ICD-10-CM

## 2017-05-13 DIAGNOSIS — K219 Gastro-esophageal reflux disease without esophagitis: Secondary | ICD-10-CM

## 2017-05-13 DIAGNOSIS — IMO0002 Reserved for concepts with insufficient information to code with codable children: Secondary | ICD-10-CM

## 2017-05-13 DIAGNOSIS — I10 Essential (primary) hypertension: Secondary | ICD-10-CM

## 2017-05-13 DIAGNOSIS — E1140 Type 2 diabetes mellitus with diabetic neuropathy, unspecified: Secondary | ICD-10-CM

## 2017-05-18 ENCOUNTER — Ambulatory Visit (INDEPENDENT_AMBULATORY_CARE_PROVIDER_SITE_OTHER): Payer: Medicare Other | Admitting: Family Medicine

## 2017-05-18 ENCOUNTER — Ambulatory Visit: Payer: Medicare Other | Admitting: Nurse Practitioner

## 2017-05-18 ENCOUNTER — Encounter: Payer: Self-pay | Admitting: Family Medicine

## 2017-05-18 VITALS — BP 132/82 | HR 81 | Temp 98.0°F | Resp 12 | Ht 66.0 in | Wt 229.4 lb

## 2017-05-18 DIAGNOSIS — G40909 Epilepsy, unspecified, not intractable, without status epilepticus: Secondary | ICD-10-CM | POA: Diagnosis not present

## 2017-05-18 DIAGNOSIS — E1165 Type 2 diabetes mellitus with hyperglycemia: Secondary | ICD-10-CM | POA: Diagnosis not present

## 2017-05-18 DIAGNOSIS — IMO0002 Reserved for concepts with insufficient information to code with codable children: Secondary | ICD-10-CM

## 2017-05-18 DIAGNOSIS — B373 Candidiasis of vulva and vagina: Secondary | ICD-10-CM

## 2017-05-18 DIAGNOSIS — I10 Essential (primary) hypertension: Secondary | ICD-10-CM | POA: Diagnosis not present

## 2017-05-18 DIAGNOSIS — E1140 Type 2 diabetes mellitus with diabetic neuropathy, unspecified: Secondary | ICD-10-CM | POA: Diagnosis not present

## 2017-05-18 DIAGNOSIS — E782 Mixed hyperlipidemia: Secondary | ICD-10-CM | POA: Diagnosis not present

## 2017-05-18 DIAGNOSIS — B3731 Acute candidiasis of vulva and vagina: Secondary | ICD-10-CM

## 2017-05-18 DIAGNOSIS — N3281 Overactive bladder: Secondary | ICD-10-CM

## 2017-05-18 LAB — BASIC METABOLIC PANEL
BUN: 12 mg/dL (ref 6–23)
CO2: 27 mEq/L (ref 19–32)
Calcium: 9.5 mg/dL (ref 8.4–10.5)
Chloride: 105 mEq/L (ref 96–112)
Creatinine, Ser: 0.89 mg/dL (ref 0.40–1.20)
GFR: 85.44 mL/min (ref >60.00)
Glucose, Bld: 240 mg/dL — ABNORMAL HIGH (ref 70–99)
Potassium: 4.1 mEq/L (ref 3.5–5.1)
Sodium: 138 mEq/L (ref 135–145)

## 2017-05-18 MED ORDER — TERCONAZOLE 0.4 % VA CREA: 1.0000 | TOPICAL_CREAM | Freq: Every day | VAGINAL | 1 refills | Status: DC

## 2017-05-18 MED ORDER — FLUCONAZOLE 150 MG PO TABS: 150.0000 mg | ORAL_TABLET | Freq: Once | ORAL | 0 refills | Status: AC

## 2017-05-18 MED ORDER — FLUCONAZOLE 150 MG PO TABS: 150.0000 mg | ORAL_TABLET | Freq: Once | ORAL | 0 refills | Status: DC

## 2017-05-18 MED ORDER — TERCONAZOLE 0.4 % VA CREA
1.0000 | TOPICAL_CREAM | Freq: Every day | VAGINAL | 1 refills | Status: DC
Start: 2017-05-18 — End: 2017-05-18

## 2017-05-18 NOTE — Patient Instructions (Addendum)
A few things to remember from today's visit:   DM type 2, uncontrolled, with neuropathy ([place]) - Plan: Ambulatory referral to Endocrinology, Basic metabolic panel, Microalbumin / creatinine urine ratio  Essential hypertension  Seizure disorder (HCC)  Vulvovaginal candidiasis - Plan: fluconazole (DIFLUCAN) 150 MG tablet, terconazole (TERAZOL 7) 0.4 % vaginal cream  Humalog increased to 15-20 U before meals. Stop sliding scale. Stop Simvastatin while taking Diflucan.   Rest no changes.  Please be sure medication list is accurate. If a new problem present, please set up appointment sooner than planned today.

## 2017-05-18 NOTE — Progress Notes (Signed)
HPI:   [name] is a 52 y.o. female, who is here today with her fiance to establish care with me.  Former PCP: Ms [name] Last preventive routine visit: 04/2016.  Chronic medical problems: DM II, HTN, CVA,HLD,OSA, COPD,overactive bladder,and asthma among some.  OSA,COPD,and asthma: She follows with Dr Elsworth Calderon.  According to pt,she was living in a assisted living facility until 3-4 years ago. She lives with her fiance now.   Concerns today: Urinary frequency. Overactive bladder,she has tried different medications. Currently she is on Myrbetriq 50 mg daily  She wonders if she can increase dose of Myrbetriq.  She follows with urologist. No dysuria or gross hematuria. Reports Hx of nephrolithiasis and "kidney problems."   Diabetes Mellitus II:  She states that she was diagnosed with juvenile diabetes at age 22. According to patient, she has not follow with endocrinologist but she mentions having continuous glucose monitoring in the past.  Reports HgA1C done 2 weeks ago at home during insurance home visit,it was 12.  Currently on Toujeo 100 U daily and Humalog 10 U before meals + 25 U prn if BS > 150.   in average she takes 80-105 U of Humalog.  Checking BS's : FG 200's,post prandial 160-170's. Hypoglycemia:5-6 times per day, < 70. She feels "sweaty", symptoms alleviated by oral juice or candy.  She is tolerating medications well. Negative for abdominal pain, nausea, vomiting, or polyphagia. + Polydipsia. LLE numbness,intermittent. Hx of peripheral neuropathy, to discontinue Gabapentin, states that she can deal with symptoms.   Lab Results  Component Value Date   CREATININE 0.97 08/27/2016   BUN 12 08/27/2016   NA 139 08/27/2016   K 4.2 08/27/2016   CL 112 (H) 08/27/2016   CO2 18 (L) 08/27/2016    Lab Results  Component Value Date   HGBA1C 9.0 (H) 08/27/2016   Lab Results  Component Value Date   MICROALBUR 1.0 11/11/2015   Recently she  has nutrition evaluation and diabetes education.  Last eye exam 3 months ago,no retinopathy but glaucoma left eye. 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   HTN:  She is on Losartan 100 mg daily.  Home BP "good" Negative for headache, visual changes, chest pain, dyspnea, palpitation, claudication, focal weakness, or edema. "8 strokes" in the past,she states that she has recovered completely. She takes Plavix 75 mg daily.  HLD: On Simvastatin 40 mg daily. She has not been consistent with her diet.   Seizure disorder: Currently she is on Topamax 200 mg bid. She has not had a seizure in 2003. She has not followed with neurologist.  After visit was completed, she asked to be seen again to address vaginal discharge. Hx of recurrent yeast infections,attrobuted to DM. Started with symptoms 3-4 days ago: Whitish vaginal discharge and pruritus, some blood on tissues, she thinsk it is from scratching. No vaginal bleeding. + Burning sensation,worse with urination.  She has tried OTC Monistat and Azo for yeast but these have not helped.   Review of Systems  Constitutional: Negative for activity change, appetite change, fatigue and fever.  HENT: Negative for mouth sores, nosebleeds and trouble swallowing.   Eyes: Negative for pain and redness.  Respiratory: Negative for cough, shortness of breath and wheezing.   Cardiovascular: Negative for chest pain, palpitations and leg swelling.  Gastrointestinal: Negative for abdominal pain, nausea and vomiting.       Negative for changes  in bowel habits.  Endocrine: Negative for cold intolerance, heat intolerance, polydipsia, polyphagia and polyuria.  Genitourinary: Positive for dysuria and vaginal discharge. Negative for decreased urine volume, hematuria and pelvic pain.  Musculoskeletal: Positive for arthralgias. Negative for gait problem.  Skin: Negative for rash and wound.    Allergic/Immunologic: Positive for environmental allergies.  Neurological: Positive for numbness. Negative for syncope, weakness and headaches.  Psychiatric/Behavioral: Negative for confusion. The patient is nervous/anxious.       Current Outpatient Medications on File Prior to Visit  Medication Sig Dispense Refill  . albuterol (PROAIR HFA) 108 (90 Base) MCG/ACT inhaler INHALE 2 PUFFS BY MOUTH EVERY 6 HOURS AS NEEDED FOR WHEEZING OR SHORTNESS OF BREATH 3 Inhaler 3  . AMBULATORY NON FORMULARY MEDICATION Medication Name: Muscle and Joint Vanishing Scent Gel- Apply as needed to skin    . baclofen (LIORESAL) 10 MG tablet TAKE ONE TABLET BY MOUTH  THREE TIMES DAILY AS NEEDED FOR MUSCLE SPASMS 90 tablet 1  . Blood Glucose Monitoring Suppl (ONETOUCH VERIO) w/Device KIT 1 kit by Does not apply route 3 (three) times daily. Dx: E11.29 1 kit 0  . budesonide-formoterol (SYMBICORT) 160-4.5 MCG/ACT inhaler Inhale 2 puffs into the lungs daily. 3 Inhaler 3  . Calcium Carbonate-Vitamin D (CALCIUM-VITAMIN D3 PO) D3 500- calcium 630 mg Take 1 capsule by mouth once daily    . clopidogrel (PLAVIX) 75 MG tablet Take 1 tablet (75 mg total) by mouth daily. 90 tablet 3  . cycloSPORINE (RESTASIS) 0.05 % ophthalmic emulsion Place 2 drops into both eyes 2 (two) times daily.    . fluorometholone (FML) 0.1 % ophthalmic suspension Place 2 drops into both eyes 2 (two) times daily.    . fluticasone (FLONASE) 50 MCG/ACT nasal spray Place 2 sprays into both nostrils daily. 16 g 3  . glucose blood (ONETOUCH VERIO) test strip 1 each by Other route 3 (three) times daily. E11.29 300 each 3  . Insulin Glargine (TOUJEO SOLOSTAR) 300 UNIT/ML SOPN Inject 100 Units into the skin daily. 27 mL 3  . insulin lispro (HUMALOG KWIKPEN) 100 UNIT/ML KiwkPen Inject 10 units with each meal plus correction (ISF 25, target 150) 30 mL 11  . Insulin Pen Needle (EASY TOUCH PEN NEEDLES) 31G X 8 MM MISC Use as directed in giving insulin. Dx: E11.29 400  each 3  . losartan (COZAAR) 50 MG tablet TAKE 1 TABLET BY MOUTH  DAILY 90 tablet 1  . MYRBETRIQ 50 MG TB24 tablet TAKE 1 TABLET BY MOUTH  DAILY 90 tablet 1  . ondansetron (ZOFRAN-ODT) 4 MG disintegrating tablet TAKE 1 TABLET BY MOUTH EVERY 8 HOURS AS NEEDED FOR NAUSEA OR VOMITING 30 tablet 1  . ONETOUCH DELICA LANCETS 11H MISC Use to check sugar three times daily as directed.Dx: E11.29; E11.40 300 each 3  . pantoprazole (PROTONIX) 40 MG tablet TAKE 1 TABLET BY MOUTH  DAILY 90 tablet 1  . polyethylene glycol (MIRALAX / GLYCOLAX) packet Take 17 g by mouth daily as needed for mild constipation. 100 each 3  . potassium chloride SA (K-DUR,KLOR-CON) 20 MEQ tablet TAKE 2 TABLETS BY MOUTH  DAILY 180 tablet 1  . ranitidine (ZANTAC) 150 MG tablet Take 1 tablet (150 mg total) by mouth 2 (two) times daily. 180 tablet 3  . simvastatin (ZOCOR) 40 MG tablet TAKE 1 TABLET BY MOUTH  DAILY 90 tablet 1  . topiramate (TOPAMAX) 200 MG tablet Take 1 tablet (200 mg total) by mouth 2 (two) times daily. 180 tablet 3  .  verapamil (VERELAN PM) 240 MG 24 hr capsule TAKE ONE CAPSULE BY MOUTH  EVERY NIGHT AT BEDTIME FOR  BLOOD PRESSURE 90 capsule 1   No current facility-administered medications on file prior to visit.      Past Medical History:  Diagnosis Date  . Anxiety   . Arthritis    joint pain   . Asthma   . COPD (chronic obstructive pulmonary disease) (HCC)    chronic bronchitis   . CVA 11/14/2007   Qualifier: Diagnosis of  By: Hassell Done [name], Tori Milks    . Depression   . Diabetes mellitus    since age 68; type 2 IDDM  . Diabetic neuropathy, painful ([place])    FEET AND HANDS  . Dilated aortic root ([place])    50m by echo 11/2015  . Dizziness    secondary to diabetes and hypertension   . Dysrhythmia    skipped beats  . Epilepsy idiopathic petit mal ([facility]    last seizure 2012;controlled w/ topomax  . GERD (gastroesophageal reflux disease)   . Glaucoma    NOT ON ANY EYE DROPS   . Headache(784.0)     migraines   . Heart murmur    born with   . Hx of blood clots    hematomas removed from left side of brain from [id number]   . Hyperlipidemia   . Hypertension   . Legally blind    left eye   . MIGRAINE HEADACHE 09/14/2006   Qualifier: Diagnosis of  By: MarHassell DoneP, NykTori Milks . [facility] infarction ([place]  hi of x 2  last one 2003   . Nephrolithiasis    frequent urination , urination at nite  PT SEEN IN ER 09/22/11 FOR BACK AND RT SIDED PAIN--HAS KNOWN STONE RT URETER AND UA IN ER SHOWED UTI  . Neuropathy    due to diabetes   . Pain 09/23/11   LOWER BACK AND RIGHT SIDE--PT HAS RIGHT URETERAL STONE  . Pneumonia    hx of 2009  . Pyelonephritis   . [place]izures ([facility] . [facility]    [facility] ; mild  . Stress incontinence   . [facility]  last 2003  RESIDUAL LEFT LEG WEAKNESS--NO OTHER RESIDUAL PROBLEMS   Allergies  Allergen Reactions  . Codeine Swelling and Other (See Comments)    Swelling and burning of mouth (inside)  . Lactose Intolerance (Gi) Nausea And Vomiting    Family History  Problem Relation Age of Onset  . Uterine cancer Mother   . Hypertension Mother   . Cancer Mother   . Brain cancer Maternal Grandmother   . Hypertension Maternal Grandmother   . Birth defects Daughter   . Hypertension Daughter   . Cirrhosis Maternal Grandfather   . ADD / ADHD Maternal Grandfather   . Birth defects Maternal Grandfather   . Anesthesia problems Neg Hx   . Colon cancer Neg Hx   . Esophageal cancer Neg Hx   . Pancreatic cancer Neg Hx     Social History   Socioeconomic History  . Marital status: Widowed    Spouse name: Not on file  . Number of children: 2  . Years of education: Not on file  . Highest education level: Not on file  Occupational History  . Not on file  Social Needs  . Financial resource strain: Not on file  . Food insecurity:  Worry: Not on file    Inability: Not on file  . Transportation needs:     Medical: Not on file    Non-medical: Not on file  Tobacco Use  . Smoking status: Former Smoker    Packs/day: 1.00    Years: 15.00    Pack years: 15.00    Types: Cigarettes    Last attempt to quit: 01/20/1987    Years since quitting: 30.3  . Smokeless tobacco: Never Used  . Tobacco comment: quit more than 20years  Substance and Sexual Activity  . Alcohol use: Yes    Alcohol/week: 0.0 oz    Comment: 1 glass weekly  . Drug use: No    Comment: hx of marijuana use no longer uses   . Sexual activity: Yes    Partners: Male    Birth control/protection: Surgical  Lifestyle  . Physical activity:    Days per week: Not on file    Minutes per session: Not on file  . Stress: Not on file  Relationships  . Social connections:    Talks on phone: Not on file    Gets together: Not on file    Attends religious service: Not on file    Active member of club or organization: Not on file    Attends meetings of clubs or organizations: Not on file    Relationship status: Not on file  Other Topics Concern  . Not on file  Social History Narrative  . Not on file    Vitals:   05/18/17 0947  BP: 132/82  Pulse: 81  Resp: 12  Temp: 98 F (36.7 C)  SpO2: 99%    Body mass index is 37.02 kg/m.   Physical Exam  Nursing note and vitals reviewed. Constitutional: She is oriented to person, place, and time. She appears well-developed. No distress.  HENT:  Head: Normocephalic and atraumatic.  Mouth/Throat: Oropharynx is clear and moist and mucous membranes are normal.  Eyes: Pupils are equal, round, and reactive to light. Conjunctivae are normal. Left eye exhibits abnormal extraocular motion.  Cardiovascular: Normal rate and regular rhythm.  No murmur heard. Pulses:      Dorsalis pedis pulses are 2+ on the right side, and 2+ on the left side.  Respiratory: Effort normal and breath sounds normal. No respiratory distress.  GI: Soft. She exhibits no mass. There is no hepatomegaly. There is no  tenderness.  Genitourinary:  Genitourinary Comments: Deferred for next visit.  Musculoskeletal: She exhibits no edema.  Lymphadenopathy:    She has no cervical adenopathy.  Neurological: She is alert and oriented to person, place, and time. Coordination and gait normal.  Mildly slurred speech. No focal deficit appreciated.  Skin: Skin is warm. No erythema.  Psychiatric: She has a normal mood and affect.  Well groomed, good eye contact.   Diabetic Foot Exam - Simple   Simple Foot Form Diabetic Foot exam was performed with the following findings:  Yes 05/18/2017 11:52 AM  Visual Inspection See comments:  Yes Sensation Testing See comments:  Yes Pulse Check Posterior Tibialis and Dorsalis pulse intact bilaterally:  Yes Comments Mildly decreased monofilament left foot. Long toenails.      ASSESSMENT AND PLAN:  Ms. Armine was seen today for establish care.  Diagnoses and all orders for this visit:  Lab Results  Component Value Date   CREATININE 0.89 05/18/2017   BUN 12 05/18/2017   NA 138 05/18/2017   K 4.1 05/18/2017   CL 105 05/18/2017  CO2 27 05/18/2017    DM type 2, uncontrolled, with neuropathy (HCC)  HgA1C 2 weeks ago reported as 12. Poorly controlled,on high dose insulin and having hypoglycemic events. Recommend increasing Humalog from 10 U to 15-20 U before meals,stop prn dose of 25 U,and no changes in Toujeo. She will continue following with endocrinologist.  Regular exercise and healthy diet with avoidance of added sugar food intake is an important part of treatment and recommended. Eye exam is current. Periodic dental and foot care recommended.  -     Ambulatory referral to Endocrinology -     Basic metabolic panel -     Microalbumin / creatinine urine ratio  Essential hypertension  Adequately controlled. No changes in current management. DASH-low salt diet recommended. Eye exam recommended annually. F/U in 4 months, before if needed.  -      Basic metabolic panel  Seizure disorder ([place])  Well controlled. No changes in Tomapax. F/U in 4 months.  Vulvovaginal candidiasis  Empiric treatment with Diflucan and topical treatment. Better DM controlled will help with prevention. F/U as needed.  -     fluconazole (DIFLUCAN) 150 MG tablet; Take 1 tablet (150 mg total) by mouth once for 1 dose. -     terconazole (TERAZOL 7) 0.4 % vaginal cream; Place 1 applicator vaginally at bedtime.  Mixed hyperlipidemia  No changes in current management. Low fat diet recommended. Last FLP 07/2016, we will plan on repeating lab next visit.  Overactive bladder  She is already on max dose of Myrbetriq and reporting using other meds in the past that did not help. Poorly controlled DM may also aggravate problem. Continue following with urologist.      Yon Schiffman G. Martinique, MD  [facility]. [place] office.

## 2017-05-19 ENCOUNTER — Encounter: Payer: Self-pay | Admitting: Family Medicine

## 2017-05-19 ENCOUNTER — Other Ambulatory Visit: Payer: Self-pay | Admitting: Internal Medicine

## 2017-05-19 ENCOUNTER — Telehealth: Payer: Self-pay | Admitting: Family Medicine

## 2017-05-19 NOTE — Telephone Encounter (Signed)
Results given by [facility]

## 2017-05-19 NOTE — Telephone Encounter (Unsigned)
Copied from [place]. Topic: Quick Communication - Lab Results >> May 19, 2017 10:08 AM Yvette Rack wrote: Pt returning call for lab results. Cb# [phone]

## 2017-05-19 NOTE — Telephone Encounter (Signed)
Pt states she needs Dr. Martinique to call in ALL her Rx's to [facility].  They are needing Dr. Doug Sou information to initially fill her medications now that [place] is her new pharmacy.  [place], [address] [facility] [address] [place] [address] Phone: [phone] Fax: [phone]

## 2017-05-19 NOTE — Addendum Note (Signed)
Addended by: Tomi Likens on: 05/19/2017 05:25 PM   Modules accepted: Orders

## 2017-05-19 NOTE — Telephone Encounter (Signed)
Message sent to Dr. Jordan for review and approval. 

## 2017-05-19 NOTE — Telephone Encounter (Signed)
Results given and documented in result note. 

## 2017-05-19 NOTE — Telephone Encounter (Unsigned)
Copied from [place] [phone]. Topic: General - Other >> May 19, 2017 12:07 PM Neva Seat wrote: Pt needing Dr. Martinique to call in ALL her Rx's to [facility].  They are needing Dr. Doug Sou information to initially fill her medications now that [place] is her new pharmacy.  [place], [address] [facility] [address] [place] [address] Phone: [phone] Fax: [phone]

## 2017-05-19 NOTE — Telephone Encounter (Signed)
Patient returning call Copied from [place] [phone]. Topic: Quick Communication - Lab Results >> May 19, 2017  8:34 AM Zacarias Pontes, CMA wrote: Called patient to inform them of their lab results. When patient returns call, triage nurse may disclose results.

## 2017-05-20 ENCOUNTER — Other Ambulatory Visit: Payer: Self-pay | Admitting: Family Medicine

## 2017-05-20 DIAGNOSIS — I10 Essential (primary) hypertension: Secondary | ICD-10-CM

## 2017-05-20 DIAGNOSIS — R569 Unspecified convulsions: Secondary | ICD-10-CM

## 2017-05-20 DIAGNOSIS — I252 Old myocardial infarction: Secondary | ICD-10-CM

## 2017-05-20 DIAGNOSIS — IMO0002 Reserved for concepts with insufficient information to code with codable children: Secondary | ICD-10-CM

## 2017-05-20 DIAGNOSIS — E1165 Type 2 diabetes mellitus with hyperglycemia: Principal | ICD-10-CM

## 2017-05-20 DIAGNOSIS — E782 Mixed hyperlipidemia: Secondary | ICD-10-CM

## 2017-05-20 DIAGNOSIS — K219 Gastro-esophageal reflux disease without esophagitis: Secondary | ICD-10-CM

## 2017-05-20 DIAGNOSIS — E1140 Type 2 diabetes mellitus with diabetic neuropathy, unspecified: Secondary | ICD-10-CM

## 2017-05-20 MED ORDER — CLOPIDOGREL BISULFATE 75 MG PO TABS: 75.0000 mg | ORAL_TABLET | Freq: Every day | ORAL | 3 refills | Status: DC

## 2017-05-20 MED ORDER — INSULIN LISPRO 100 UNIT/ML (KWIKPEN): PEN_INJECTOR | SUBCUTANEOUS | 1 refills | Status: DC

## 2017-05-20 MED ORDER — PANTOPRAZOLE SODIUM 40 MG PO TBEC: 40.0000 mg | DELAYED_RELEASE_TABLET | Freq: Every day | ORAL | 1 refills | Status: DC

## 2017-05-20 MED ORDER — SIMVASTATIN 40 MG PO TABS: 40.0000 mg | ORAL_TABLET | Freq: Every day | ORAL | 1 refills | Status: DC

## 2017-05-20 MED ORDER — LOSARTAN POTASSIUM 50 MG PO TABS: 50.0000 mg | ORAL_TABLET | Freq: Every day | ORAL | 1 refills | Status: DC

## 2017-05-20 MED ORDER — TOPIRAMATE 200 MG PO TABS: 200.0000 mg | ORAL_TABLET | Freq: Two times a day (BID) | ORAL | 1 refills | Status: DC

## 2017-05-20 MED ORDER — INSULIN GLARGINE 300 UNIT/ML ~~LOC~~ SOPN: 100.0000 [IU] | PEN_INJECTOR | Freq: Every day | SUBCUTANEOUS | 1 refills | Status: DC

## 2017-05-20 NOTE — Telephone Encounter (Signed)
Rx's of problems we address during her visit were sent to her pharmacy. There are some from pulmonologist (inhalers) and eye care provider (eye drops) that she needs to request from their office.  Thanks, BJ

## 2017-05-21 NOTE — Telephone Encounter (Signed)
Left message informing patient that Dr. Martinique took care of Rx's that are prescribed by PCP and she needed to get in contact with her other doctors for her other refills that they prescribed and to give the office a call if she had any questions or concerns.

## 2017-05-22 ENCOUNTER — Other Ambulatory Visit: Payer: Self-pay | Admitting: Family Medicine

## 2017-05-22 DIAGNOSIS — B3731 Acute candidiasis of vulva and vagina: Secondary | ICD-10-CM

## 2017-05-22 DIAGNOSIS — B373 Candidiasis of vulva and vagina: Secondary | ICD-10-CM

## 2017-05-25 NOTE — Telephone Encounter (Signed)
I sent another Rx for Diflucan. We can not continue this medication chronically. This medication has side effects and can also interact with her cholesterol medication. Her DM needs to be better controlled in order to treat just infection successfully.  Thanks, BJ

## 2017-06-01 ENCOUNTER — Other Ambulatory Visit: Payer: Self-pay | Admitting: Family Medicine

## 2017-06-01 ENCOUNTER — Telehealth: Payer: Self-pay | Admitting: *Deleted

## 2017-06-01 MED ORDER — BACLOFEN 10 MG PO TABS: 5.0000 mg | ORAL_TABLET | Freq: Every day | ORAL | 0 refills | Status: DC | PRN

## 2017-06-01 NOTE — Telephone Encounter (Signed)
R for baclofenx sent to her pharmacy. It seems like we did not have the time  To review all her medications and chronic medical problems.  We did not discuss back pain/muscle spasms, for which I assume she is taking baclofen. Please arrange appointment to discuss this problem.  Thanks, BJ

## 2017-06-01 NOTE — Telephone Encounter (Signed)
Requesting refill of  Baclofen tab 10 mg

## 2017-06-02 NOTE — Telephone Encounter (Signed)
Spoke with patient and informed her that Rx was sent to pharmacy. Patient scheduled appointment for 06/08/17.

## 2017-06-02 NOTE — Telephone Encounter (Signed)
Pt returned call , per office, Noelle Penner was on the other line.

## 2017-06-02 NOTE — Telephone Encounter (Signed)
Left message for patient to give clinic a call back. 

## 2017-06-08 ENCOUNTER — Encounter: Payer: Self-pay | Admitting: Family Medicine

## 2017-06-08 ENCOUNTER — Ambulatory Visit (INDEPENDENT_AMBULATORY_CARE_PROVIDER_SITE_OTHER): Payer: Medicare Other | Admitting: Family Medicine

## 2017-06-08 VITALS — BP 140/82 | HR 88 | Temp 97.7°F | Resp 12 | Ht 66.0 in | Wt 227.0 lb

## 2017-06-08 DIAGNOSIS — I10 Essential (primary) hypertension: Secondary | ICD-10-CM

## 2017-06-08 DIAGNOSIS — M545 Low back pain: Secondary | ICD-10-CM | POA: Diagnosis not present

## 2017-06-08 DIAGNOSIS — K219 Gastro-esophageal reflux disease without esophagitis: Secondary | ICD-10-CM | POA: Diagnosis not present

## 2017-06-08 DIAGNOSIS — N3281 Overactive bladder: Secondary | ICD-10-CM | POA: Diagnosis not present

## 2017-06-08 DIAGNOSIS — E876 Hypokalemia: Secondary | ICD-10-CM

## 2017-06-08 DIAGNOSIS — G8929 Other chronic pain: Secondary | ICD-10-CM

## 2017-06-08 MED ORDER — VERAPAMIL HCL ER 240 MG PO CP24: 240.0000 mg | ORAL_CAPSULE | Freq: Every day | ORAL | 11 refills | Status: DC

## 2017-06-08 MED ORDER — PANTOPRAZOLE SODIUM 40 MG PO TBEC: 40.0000 mg | DELAYED_RELEASE_TABLET | Freq: Every day | ORAL | 6 refills | Status: DC

## 2017-06-08 MED ORDER — RANITIDINE HCL 150 MG PO TABS: 150.0000 mg | ORAL_TABLET | Freq: Two times a day (BID) | ORAL | 6 refills | Status: DC

## 2017-06-08 MED ORDER — LOSARTAN POTASSIUM 50 MG PO TABS: 50.0000 mg | ORAL_TABLET | Freq: Every day | ORAL | 6 refills | Status: DC

## 2017-06-08 MED ORDER — POTASSIUM CHLORIDE CRYS ER 20 MEQ PO TBCR
40.0000 meq | EXTENDED_RELEASE_TABLET | Freq: Every day | ORAL | 6 refills | Status: DC
Start: 2017-06-08 — End: 2018-01-24

## 2017-06-08 MED ORDER — MIRABEGRON ER 50 MG PO TB24: 50.0000 mg | ORAL_TABLET | Freq: Every day | ORAL | 11 refills | Status: DC

## 2017-06-08 MED ORDER — BACLOFEN 10 MG PO TABS: 10.0000 mg | ORAL_TABLET | Freq: Three times a day (TID) | ORAL | 6 refills | Status: DC

## 2017-06-08 NOTE — Patient Instructions (Signed)
A few things to remember from today's visit:   Overactive bladder - Plan: mirabegron ER (MYRBETRIQ) 50 MG TB24 tablet  Essential hypertension, benign - Plan: verapamil (VERELAN PM) 240 MG 24 hr capsule, losartan (COZAAR) 50 MG tablet  Gastroesophageal reflux disease without esophagitis - Plan: pantoprazole (PROTONIX) 40 MG tablet, ranitidine (ZANTAC) 150 MG tablet  Hypokalemia - Plan: potassium chloride SA (K-DUR,KLOR-CON) 20 MEQ tablet   Please be sure medication list is accurate. If a new problem present, please set up appointment sooner than planned today.

## 2017-06-08 NOTE — Assessment & Plan Note (Signed)
It seems to be well controlled on current baclofen, some side effects discussed. She will continue Baclofen 10 mg 3 times daily. Follow-up in 6 months.

## 2017-06-08 NOTE — Assessment & Plan Note (Signed)
Well-controlled. No changes in K-DUR dose. Follow-up in 6 months.

## 2017-06-08 NOTE — Progress Notes (Signed)
HPI:   [name] is a 52 y.o. female, who is here today with her husband to follow on recent OV.   She was seen on 05/18/2017, when she establish care. She has multiple medical problems, we did not have time to go through all of her medication last visit.  Low back pain radiating to lower extremities and LUE muscle spasm.  She takes baclofen 10 mg 3 times daily, if she takes medication around the clock she does not have muscle spasms or pain. She has been on baclofen since 2013.  She states that about 4-5 years ago she saw her orthopedist. She used to follow with pain management.  No known exacerbating factors.  Overactive bladder: She has been  on Myrbetriq 50 mg daily for years, he helps with symptoms but not 100%. Tolerating medication well. Denies dysuria,increased urinary frequency, gross hematuria,or decreased urine output.  Zofran also on her medication list.  According to patient, it was initially prescribed when she had an episode of nephrolithiasis. She has not had a kidney stone in years but is still taken Zofran for "lactose intolerance." She tries to avoid daily products but when she does she develops nausea, Zofran helps with this.  GERD: She is on Zantac 150 mg twice daily and Protonix 40 mg daily.  No changes in bowel habits, abdominal pain, blood in the stool, or milligrams.   Last visit she was referred to endocrinologist for DM 2, which is poorly controlled. She has an appointment with Dr. Dwyane Dee on 07/05/2017.   Asthma and OSA, she follows with Dr. Elsworth Soho. Seizure disorder well-controlled with Topamax. Hyperlipidemia on Zocor 40 mg daily. Hypokalemia on K. Dur 20 mEq twice daily.  Lab Results  Component Value Date   CREATININE 0.89 05/18/2017   BUN 12 05/18/2017   NA 138 05/18/2017   K 4.1 05/18/2017   CL 105 05/18/2017   CO2 27 05/18/2017    She is requesting 30 days supply of all of her medications.    Review of Systems    Constitutional: Negative for activity change, appetite change, fatigue and fever.  HENT: Negative for mouth sores, nosebleeds and trouble swallowing.   Eyes: Negative for redness and visual disturbance.  Respiratory: Negative for cough, shortness of breath and wheezing.   Cardiovascular: Negative for chest pain, palpitations and leg swelling.  Gastrointestinal: Negative for abdominal pain, nausea and vomiting.       Negative for changes in bowel habits.  Genitourinary: Positive for frequency. Negative for decreased urine volume, dysuria and hematuria.  Musculoskeletal: Positive for arthralgias, back pain and myalgias. Negative for gait problem.  Neurological: Positive for numbness. Negative for seizures, syncope, weakness and headaches.      Current Outpatient Medications on File Prior to Visit  Medication Sig Dispense Refill  . albuterol (PROAIR HFA) 108 (90 Base) MCG/ACT inhaler INHALE 2 PUFFS BY MOUTH EVERY 6 HOURS AS NEEDED FOR WHEEZING OR SHORTNESS OF BREATH 3 Inhaler 3  . AMBULATORY NON FORMULARY MEDICATION Medication Name: Muscle and Joint Vanishing Scent Gel- Apply as needed to skin    . Blood Glucose Monitoring Suppl (ONETOUCH VERIO) w/Device KIT 1 kit by Does not apply route 3 (three) times daily. Dx: E11.29 1 kit 0  . budesonide-formoterol (SYMBICORT) 160-4.5 MCG/ACT inhaler Inhale 2 puffs into the lungs daily. 3 Inhaler 3  . Calcium Carbonate-Vitamin D (CALCIUM-VITAMIN D3 PO) D3 500- calcium 630 mg Take 1 capsule by mouth once daily    .  clopidogrel (PLAVIX) 75 MG tablet Take 1 tablet (75 mg total) by mouth daily. 90 tablet 3  . cycloSPORINE (RESTASIS) 0.05 % ophthalmic emulsion Place 2 drops into both eyes 2 (two) times daily.    . fluconazole (DIFLUCAN) 150 MG tablet 1 tab weekly x 3. 3 tablet 0  . fluorometholone (FML) 0.1 % ophthalmic suspension Place 2 drops into both eyes 2 (two) times daily.    . fluticasone (FLONASE) 50 MCG/ACT nasal spray Place 2 sprays into both  nostrils daily. 16 g 3  . glucose blood (ONETOUCH VERIO) test strip 1 each by Other route 3 (three) times daily. E11.29 300 each 3  . HUMALOG KWIKPEN 200 UNIT/ML SOPN     . Insulin Glargine (TOUJEO SOLOSTAR) 300 UNIT/ML SOPN Inject 100 Units into the skin daily. 27 mL 1  . insulin lispro (HUMALOG KWIKPEN) 100 UNIT/ML KiwkPen Inject 10 units with each meal plus correction (ISF 25, target 150) 30 mL 1  . Insulin Pen Needle (EASY TOUCH PEN NEEDLES) 31G X 8 MM MISC Use as directed in giving insulin. Dx: E11.29 400 each 3  . ondansetron (ZOFRAN-ODT) 4 MG disintegrating tablet TAKE 1 TABLET BY MOUTH EVERY 8 HOURS AS NEEDED FOR NAUSEA OR VOMITING 30 tablet 1  . ONETOUCH DELICA LANCETS 48J MISC Use to check sugar three times daily as directed.Dx: E11.29; E11.40 300 each 3  . polyethylene glycol (MIRALAX / GLYCOLAX) packet Take 17 g by mouth daily as needed for mild constipation. 100 each 3  . simvastatin (ZOCOR) 40 MG tablet Take 1 tablet (40 mg total) by mouth daily. 90 tablet 1  . terconazole (TERAZOL 7) 0.4 % vaginal cream Place 1 applicator vaginally at bedtime. 45 g 1  . topiramate (TOPAMAX) 200 MG tablet Take 1 tablet (200 mg total) by mouth 2 (two) times daily. 180 tablet 1   No current facility-administered medications on file prior to visit.      Past Medical History:  Diagnosis Date  . Anxiety   . Arthritis    joint pain   . Asthma   . COPD (chronic obstructive pulmonary disease) (HCC)    chronic bronchitis   . CVA 11/14/2007   Qualifier: Diagnosis of  By: Hassell Done [name], Tori Milks    . Depression   . Diabetes mellitus    since age 16; type 2 IDDM  . Diabetic neuropathy, painful ([place])    FEET AND HANDS  . Dilated aortic root ([place])    29m by echo 11/2015  . Dizziness    secondary to diabetes and hypertension   . Dysrhythmia    skipped beats  . Epilepsy idiopathic petit mal ([place]    last seizure 2012;controlled w/ topomax  . GERD (gastroesophageal reflux disease)   . Glaucoma     NOT ON ANY EYE DROPS   . Headache(784.0)    migraines   . Heart murmur    born with   . Hx of blood clots    hematomas removed from left side of brain from [id number]   . Hyperlipidemia   . Hypertension   . Legally blind    left eye   . MIGRAINE HEADACHE 09/14/2006   Qualifier: Diagnosis of  By: MarHassell DoneP, NykTori Milks . Myocardial infarction ([facility]  hi of x 2  last one 2003   . Nephrolithiasis    frequent urination , urination at nite  PT SEEN IN ER 09/22/11 FOR BACK AND RT SIDED PAIN--HAS KNOWN  STONE RT URETER AND UA IN ER SHOWED UTI  . Neuropathy    due to diabetes   . Pain 09/23/11   LOWER BACK AND RIGHT SIDE--PT HAS RIGHT URETERAL STONE  . Pneumonia    hx of 2009  . Pyelonephritis   . Seizures ([place])   . [facility] ap[facility]    [facility] ; mild  . Stress incontinence   . Stroke [facility])    last 2003  RESIDUAL LEFT LEG WEAKNESS--NO OTHER RESIDUAL PROBLEMS   Allergies  Allergen Reactions  . Codeine Swelling and Other (See Comments)    Swelling and burning of mouth (inside)  . Lactose Intolerance ([place]) Nausea And Vomiting    Social History   Socioeconomic History  . Marital status: Married    Spouse name: Not on file  . Number of children: 2  . Years of education: Not on file  . Highest education level: Not on file  Occupational History  . Not on file  Social Needs  . Financial resource strain: Not on file  . Food insecurity:    Worry: Not on file    Inability: Not on file  . Transportation needs:    Medical: Not on file    Non-medical: Not on file  Tobacco Use  . Smoking status: Former Smoker    Packs/day: 1.00    Years: 15.00    Pack years: 15.00    Types: Cigarettes    Last attempt to quit: 01/20/1987    Years since quitting: 30.4  . Smokeless tobacco: Never Used  . Tobacco comment: quit more than 20years  Substance and Sexual Activity  . Alcohol use: Yes    Alcohol/week: 0.0 oz    Comment: 1 glass weekly  . Drug  use: No    Comment: hx of marijuana use no longer uses   . Sexual activity: Yes    Partners: Male    Birth control/protection: Surgical  Lifestyle  . Physical activity:    Days per week: Not on file    Minutes per session: Not on file  . Stress: Not on file  Relationships  . Social connections:    Talks on phone: Not on file    Gets together: Not on file    Attends religious service: Not on file    Active member of club or organization: Not on file    Attends meetings of clubs or organizations: Not on file    Relationship status: Not on file  Other Topics Concern  . Not on file  Social History Narrative  . Not on file    Vitals:   06/08/17 1054  BP: 140/82  Pulse: 88  Resp: 12  Temp: 97.7 F (36.5 C)  SpO2: 99%   Body mass index is 36.64 kg/m.   Physical Exam  Nursing note and vitals reviewed. Constitutional: She is oriented to person, place, and time. She appears well-developed. No distress.  HENT:  Head: Normocephalic and atraumatic.  Mouth/Throat: Oropharynx is clear and moist and mucous membranes are normal.  Eyes: Pupils are equal, round, and reactive to light. Conjunctivae are normal.  Cardiovascular: Normal rate and regular rhythm.  No murmur heard. Pulses:      Dorsalis pedis pulses are 2+ on the right side, and 2+ on the left side.  Respiratory: Effort normal and breath sounds normal. No respiratory distress.  GI: Soft. She exhibits no mass. There is no hepatomegaly. There is no tenderness.  Musculoskeletal: She  exhibits no edema.       Lumbar back: She exhibits tenderness. She exhibits no bony tenderness.       Back:  Lymphadenopathy:    She has no cervical adenopathy.  Neurological: She is alert and oriented to person, place, and time. She has normal strength. Gait normal.  SLR negative bilateral.  Skin: Skin is warm. No erythema.  Psychiatric: She has a normal mood and affect.  Well groomed, good eye contact.    ASSESSMENT AND PLAN:  Ms.  Edna was seen today for follow-up.  Diagnoses and all orders for this visit:   GERD Problem is well controlled. No changes in Protonix or Zantac. GERD precautions to continue. She can follow annually, before if needed.  Chronic low back pain It seems to be well controlled on current baclofen, some side effects discussed. She will continue Baclofen 10 mg 3 times daily. Follow-up in 6 months.  Overactive bladder Stable overall. She will continue Myrbetriq 50 mg daily. Follow-up annually, before if needed.  Hypokalemia Well-controlled. No changes in K-DUR dose. Follow-up in 6 months.  Essential hypertension SBP slightly elevated. For now she will continue Losartan 50 mg daily       Betty G. Martinique, MD  [facility]. [place] office.

## 2017-06-08 NOTE — Assessment & Plan Note (Signed)
Problem is well controlled. No changes in Protonix or Zantac. GERD precautions to continue. She can follow annually, before if needed.

## 2017-06-08 NOTE — Assessment & Plan Note (Signed)
>>  ASSESSMENT AND PLAN FOR CHRONIC PAIN WRITTEN ON 06/08/2017 12:37 PM BY JORDAN, BETTY G, MD  It seems to be well controlled on current baclofen , some side effects discussed. She will continue Baclofen  10 mg 3 times daily. Follow-up in 6 months.

## 2017-06-08 NOTE — Assessment & Plan Note (Signed)
SBP slightly elevated. For now she will continue Losartan 50 mg daily

## 2017-06-08 NOTE — Assessment & Plan Note (Signed)
Stable overall. She will continue Myrbetriq 50 mg daily. Follow-up annually, before if needed.

## 2017-07-04 NOTE — Progress Notes (Signed)
Patient ID: [name], female   DOB: [birth year], 52 y.o.   MRN: [id number]          Reason for Appointment: Consultation for Type 2 Diabetes  Referring physician:   History of Present Illness:          Date of diagnosis of type 2 diabetes mellitus:   1989      Background history:   She thinks she was initially treated with diet alone and subsequently given metformin With metformin she had nausea and diarrhea but she does not know if she was treated with any other medications until she was started on insulin in 2003.  Also was on glipizide in 2015 Most likely she was on Levemir or Lantus insulin for quite some time She was then switched to Toujeo in 2016 and although she was initially prescribed 90 units twice a day this was subsequently lowered  Her A1c has been occasionally between 7.2 and 7.5 but has been consistently over 9% since 2018  Recent history:   INSULIN regimen is:100 U Toujeo hs, Humalog irregularly 20 units       Non-insulin hypoglycemic drugs the patient is taking are: None  Current management, blood sugar patterns and problems identified:  She has not checked her blood sugars since last month and has only sporadic readings, morning  Although she is supposed to take Humalog with every meal she is taking it rarely and mostly midday.  This is despite her saying that she does not mind doing the injections but does not understand the need for controlling mealtime hyperglycemia  She has not taken Humalog today before her breakfast which was consisting of grits and [place] bacon and her blood sugar is 296 postprandially  She does try to gently watch her diet with avoiding high fat, high sugar foods and drinks and usually trying to follow instructions given by the dietitian twice last year  She thinks she takes her bedtime Toujeo consistently  Currently not motivated to exercise        Side effects from medications have been:m  Compliance with the  medical regimen:  Hypoglycemia:    Glucose monitoring:  done <1 times a day         Glucometer: One Touch Verio.       Blood Glucose readings by time of day and averages from meter download: Recent readings are all from 05/2017  PREMEAL Breakfast Lunch Dinner Bedtime  Overall   Glucose range:  97-260    228, 429   Median:         Self-care: The diet thas the patient has been following is: tries to limit .     Meal times are: Irregular  Typical meal intake: Breakfast is grits and bacon                Dietician visit, most recent: 9/18               Exercise:  Minimal, does have exercise bike at home  Weight history: Previous range 219-230  Wt Readings from Last 3 Encounters:  07/05/17 225 lb 3.2 oz (102.2 kg)  06/08/17 227 lb (103 kg)  05/18/17 229 lb 6 oz (104 kg)    Glycemic control:    Lab Results  Component Value Date   HGBA1C 10.6 (A) 07/05/2017   HGBA1C 9.0 (H) 08/27/2016   HGBA1C 10.0 (H) 05/06/2016   Lab Results  Component Value Date   MICROALBUR 1.0 11/11/2015   LDLCALC 80  07/30/2016   CREATININE 0.89 05/18/2017   Lab Results  Component Value Date   MICRALBCREAT 9 11/11/2015    Lab Results  Component Value Date   FRUCTOSAMINE CANCELED 07/26/2015   FRUCTOSAMINE 264 07/26/2015      Allergies as of 07/05/2017      Reactions   Codeine Swelling, Other (See Comments)   Swelling and burning of mouth (inside)   Lactose Intolerance (gi) Nausea And Vomiting      Medication List        Accurate as of 07/05/17  2:57 PM. Always use your most recent med list.          albuterol 108 (90 Base) MCG/ACT inhaler Commonly known as:  PROAIR HFA INHALE 2 PUFFS BY MOUTH EVERY 6 HOURS AS NEEDED FOR WHEEZING OR SHORTNESS OF BREATH   AMBULATORY NON FORMULARY MEDICATION Medication Name: Muscle and Joint Vanishing Scent Gel- Apply as needed to skin   baclofen 10 MG tablet Commonly known as:  LIORESAL Take 1 tablet (10 mg total) by mouth 3 (three) times  daily.   budesonide-formoterol 160-4.5 MCG/ACT inhaler Commonly known as:  SYMBICORT Inhale 2 puffs into the lungs daily.   CALCIUM-VITAMIN D3 PO D3 500- calcium 630 mg Take 1 capsule by mouth once daily   clopidogrel 75 MG tablet Commonly known as:  PLAVIX Take 1 tablet (75 mg total) by mouth daily.   cycloSPORINE 0.05 % ophthalmic emulsion Commonly known as:  RESTASIS Place 2 drops into both eyes 2 (two) times daily.   Dulaglutide 0.75 MG/0.5ML Sopn Commonly known as:  TRULICITY Inject in the abdominal skin as directed once a week   fluorometholone 0.1 % ophthalmic suspension Commonly known as:  FML Place 2 drops into both eyes 2 (two) times daily.   fluticasone 50 MCG/ACT nasal spray Commonly known as:  FLONASE Place 2 sprays into both nostrils daily.   glucose blood test strip Commonly known as:  ONETOUCH VERIO 1 each by Other route 3 (three) times daily. E11.29   Insulin Glargine 300 UNIT/ML Sopn Commonly known as:  TOUJEO SOLOSTAR Inject 100 Units into the skin daily.   insulin lispro 100 UNIT/ML KiwkPen Commonly known as:  HUMALOG KWIKPEN Inject 10 units with each meal plus correction (ISF 25, target 150)   Insulin Pen Needle 31G X 8 MM Misc Commonly known as:  EASY TOUCH PEN NEEDLES Use as directed in giving insulin. Dx: E11.29   losartan 50 MG tablet Commonly known as:  COZAAR Take 1 tablet (50 mg total) by mouth daily.   mirabegron ER 50 MG Tb24 tablet Commonly known as:  MYRBETRIQ Take 1 tablet (50 mg total) by mouth daily.   ondansetron 4 MG disintegrating tablet Commonly known as:  ZOFRAN-ODT TAKE 1 TABLET BY MOUTH EVERY 8 HOURS AS NEEDED FOR NAUSEA OR VOMITING   ONETOUCH DELICA LANCETS 63A Misc Use to check sugar three times daily as directed.Dx: E11.29; E11.40   ONETOUCH VERIO w/Device Kit 1 kit by Does not apply route 3 (three) times daily. Dx: E11.29   pantoprazole 40 MG tablet Commonly known as:  PROTONIX Take 1 tablet (40 mg total)  by mouth daily.   polyethylene glycol packet Commonly known as:  MIRALAX / GLYCOLAX Take 17 g by mouth daily as needed for mild constipation.   potassium chloride SA 20 MEQ tablet Commonly known as:  K-DUR,KLOR-CON Take 2 tablets (40 mEq total) by mouth daily.   pregabalin 50 MG capsule Commonly known as:  LYRICA Take 1  capsule (50 mg total) by mouth 3 (three) times daily.   ranitidine 150 MG tablet Commonly known as:  ZANTAC Take 1 tablet (150 mg total) by mouth 2 (two) times daily.   simvastatin 40 MG tablet Commonly known as:  ZOCOR Take 1 tablet (40 mg total) by mouth daily.   terconazole 0.4 % vaginal cream Commonly known as:  TERAZOL 7 Place 1 applicator vaginally at bedtime.   topiramate 200 MG tablet Commonly known as:  TOPAMAX Take 1 tablet (200 mg total) by mouth 2 (two) times daily.   verapamil 240 MG 24 hr capsule Commonly known as:  VERELAN PM Take 1 capsule (240 mg total) by mouth at bedtime.       Allergies:  Allergies  Allergen Reactions  . Codeine Swelling and Other (See Comments)    Swelling and burning of mouth (inside)  . Lactose Intolerance (Gi) Nausea And Vomiting    Past Medical History:  Diagnosis Date  . Anxiety   . Arthritis    joint pain   . Asthma   . COPD (chronic obstructive pulmonary disease) (HCC)    chronic bronchitis   . CVA 11/14/2007   Qualifier: Diagnosis of  By: Hassell Done [name], Tori Milks    . Depression   . Diabetes mellitus    since age 76; type 2 IDDM  . Diabetic neuropathy, painful ([place])    FEET AND HANDS  . Dilated aortic root ([place])    41m by echo 11/2015  . Dizziness    secondary to diabetes and hypertension   . Dysrhythmia    skipped beats  . [facility] ([facility]    last seizure 2012;controlled w/ topomax  . GERD (gastroesophageal reflux disease)   . Glaucoma    NOT ON ANY EYE DROPS   . Headache(784.0)    migraines   . Heart murmur    born with   . Hx of blood clots    hematomas removed  from left side of brain from 180moto [id number]   . Hyperlipidemia   . Hypertension   . Legally blind    left eye   . MIGRAINE HEADACHE 09/14/2006   Qualifier: Diagnosis of  By: MarHassell DoneP, NykTori Milks . Myocardial infarction ([facility]  hi of x 2  last one 2003   . Nephrolithiasis    frequent urination , urination at nite  PT SEEN IN ER 09/22/11 FOR BACK AND RT SIDED PAIN--HAS KNOWN STONE RT URETER AND UA IN ER SHOWED UTI  . Neuropathy    due to diabetes   . Pain 09/23/11   LOWER BACK AND RIGHT SIDE--PT HAS RIGHT URETERAL STONE  . Pneumonia    hx of 2009  . Pyelonephritis   . Seizures ([facility] . [facility]    [facility] ; mild  . Stress incontinence   . [facility]  [facility]  RESIDUAL LEFT LEG WEAKNESS--NO OTHER RESIDUAL PROBLEMS    Past Surgical History:  Procedure Laterality Date  . BRAIN HEMATOMA EVACUATION     five procedures total, first procedure when 10 7 monthsd, last at 4 y12ars of age.  . CYSTOSCOPY W/ RETROGRADES  09/24/2011   Procedure: CYSTOSCOPY WITH RETROGRADE PYELOGRAM;  Surgeon: JohMalka SoD;  Location: [facility];  Service: Urology;  Laterality: Right;  . CYSTOSCOPY WITH URETEROSCOPY  09/24/2011   Procedure: CYSTOSCOPY WITH URETEROSCOPY;  Surgeon: JohMalka SoD;  Location: [facility];  Service: Urology;  Laterality: Right;  Balloon dilation right ureter   . CYSTOSCOPY/RETROGRADE/URETEROSCOPY  08/24/2011   Procedure: CYSTOSCOPY/RETROGRADE/URETEROSCOPY;  Surgeon: Molli Hazard, MD;  Location: [facility];  Service: Urology;  Laterality: Right;  Cysto, Right retrograde Pyelogram, right stent placement.   Fatima Blank HERNIA REPAIR  05/05/2011   Procedure: LAPAROSCOPIC INCISIONAL HERNIA;  Surgeon: Rolm Bookbinder, MD;  Location: [place];  Service: General;  Laterality: N/A;  . kidney stone removal    . MASS EXCISION  05/05/2011   Procedure: EXCISION MASS;  Surgeon: Rolm Bookbinder, MD;  Location: [place];  Service: General;   Laterality: Right;  . PCNL    . RETINAL DETACHMENT SURGERY Left 1990  . SHUNT REMOVAL     shunt inserted at age 48 removed at age 31   . VAGINAL HYSTERECTOMY  1996    Family History  Problem Relation Age of Onset  . Uterine cancer Mother   . Hypertension Mother   . Cancer Mother   . Brain cancer Maternal Grandmother   . Hypertension Maternal Grandmother   . Birth defects Daughter   . Hypertension Daughter   . Cirrhosis Maternal Grandfather   . ADD / ADHD Maternal Grandfather   . Birth defects Maternal Grandfather   . Diabetes Maternal Grandfather   . Anesthesia problems Neg Hx   . Colon cancer Neg Hx   . Esophageal cancer Neg Hx   . Pancreatic cancer Neg Hx     Social History:  reports that she quit smoking about 30 years ago. Her smoking use included cigarettes. She has a 15.00 pack-year smoking history. She has never used smokeless tobacco. She reports that she drinks alcohol. She reports that she does not use drugs.   Review of Systems  Genitourinary:       3-4x Candida   Musculoskeletal: Positive for back pain.  Neurological: Positive for numbness and tingling.       Gaba     Lipid history:     Lab Results  Component Value Date   CHOL 144 07/30/2016   HDL 39 (L) 07/30/2016   LDLCALC 80 07/30/2016   TRIG 123 07/30/2016   CHOLHDL 3.7 07/30/2016           Hypertension:  BP Readings from Last 3 Encounters:  07/05/17 140/80  06/08/17 140/82  05/18/17 132/82    Most recent eye exam was in 4/19  Most recent foot exam:     LABS:  Office Visit on 07/05/2017  Component Date Value Ref Range Status  . Hemoglobin A1C 07/05/2017 10.6* 4.0 - 5.6 % Final  . POC Glucose 07/05/2017 296* 70 - 99 mg/dl Final    Physical Examination:  BP 140/80 (BP Location: Right Arm, Patient Position: Sitting, Cuff Size: Normal)   Pulse 72   Ht 5' 6"  (1.676 m)   Wt 225 lb 3.2 oz (102.2 kg)   SpO2 98%   BMI 36.35 kg/m   GENERAL:         Patient has generalized  obesity.    HEENT:         Eye exam shows normal external appearance.  Fundus exam shows no retinopathy.  Oral exam shows normal mucosa .  NECK:   There is no lymphadenopathy Thyroid is not enlarged and no nodules felt.  Carotids are normal to palpation and no bruit heard  LUNGS:         Chest is symmetrical. Lungs are clear to auscultation.[name]   HEART:  Heart sounds:  S1 and S2 are normal. No murmur or click heard., no S3 or S4.   ABDOMEN:   There is no distention present. Liver and spleen are not palpable. No other mass or tenderness present.    NEUROLOGICAL:   Ankle jerks are absent bilaterally.    Diabetic Foot Exam - Simple   Simple Foot Form Diabetic Foot exam was performed with the following findings:  Yes   Visual Inspection No deformities, no ulcerations, no other skin breakdown bilaterally:  Yes Sensation Testing See comments:  Yes Pulse Check Posterior Tibialis and Dorsalis pulse intact bilaterally:  Yes Comments Mild to moderate decrease in monofilament sensation distally on the toes            Vibration sense is mildly reduced in distal first toes.  MUSCULOSKELETAL:  There is no swelling or deformity of the peripheral joints.     EXTREMITIES:     There is no edema.  SKIN:       No rash or lesions of concern.        ASSESSMENT:  Diabetes type 2, uncontrolled, long-standing  See history of present illness for detailed discussion of current diabetes management, blood sugar patterns and problems identified     Her A1c is 10.6 and consistently over at least 9% for some time She is on a regimen of only insulin and appears to be taking mostly basal insulin using 100 units of Toujeo daily She has poor understanding of diabetes management, insulin, glucose monitoring and blood sugar target She is not able to lose weight Also not exercising She may be following her diet fairly well since she has had some dietary instructions  Complications of diabetes: Symptoms  of peripheral neuropathy, unknown status of nephropathy with no recent microalbumin, reportedly no retinopathy  History of microvascular disease with CAD  Hypertension and hyperlipidemia, apparently well controlled but needs follow-up lipids  NEUROPATHY: She has painful neuropathy and previously had not benefited from gabapentin  PLAN:    She is a good candidate for medication like Ozempic or Victoza.  Currently on the Trulicity appears to be covered Discussed with the patient the nature of GLP-1 drugs, the action on various organ systems, how they benefit blood glucose control, as well as the benefit of weight loss and  increase satiety . Explained possible side effects, particularly nausea and vomiting that usually resolve over time; discussed safety information in package insert. Demonstrated the medication injection device and injection technique to the patient.  Showed patient where to inject the medication. To start with 0.75 mg dosage weekly for the first 4 weeks Patient brochure on Trulicity with enclosed co-pay card given  Today discussed in detail the need for mealtime insulin to cover postprandial spikes, action of mealtime insulin, timing and action of the rapid acting insulin as well as starting dose and dosage titration to target the two-hour reading of under 180.  She will take HUMALOG 20 units with every meal before eating and start checking blood sugars consistently 2 to 3 hours after each meal to help adjust the dose  She will go up by 4 units on the pre-meal dose if blood sugars 2 to 3 hours later are over 200  No change in Toujeo as yet but discussed that this will be adjusted subsequently once her fasting readings show a pattern  She will review her progress and follow-up in 6 weeks but if she has difficulty with adjusting insulin she will need to see our  diabetes educator  Start using exercise bike daily  Needs urine microalbumin checked  Also discussed principles  of insulin pump therapy and showed her the Omnipod insulin pump to consider and may need to see if she continues to be insulin resistant or has difficulty with compliance with mealtime shots  She will start LYRICA 50 mg 3 times daily for her neuropathy as she has previously had some benefit from this   Patient Instructions  Check blood sugars on waking up  4/7 days  Also check blood sugars about 2 hours after a meal and do this after different meals by rotation  Recommended blood sugar levels on waking up is 90-130 and about 2 hours after meal is 130-160  Please bring your blood sugar monitor to each visit, thank you . Humalog 20 before meals and adjust as directed  Start TRULICITYwith the pen as shown once weekly on the same day of the week.  You may inject in the stomach, thigh or arm as indicated in the brochure given.   You will feel fullness of the stomach with starting the medication and should try to keep the portions at meals small.   You may experience nausea in the first few days which usually gets better over time   If any questions or concerns are present call the office or the  [place] at [phone]. Also visit [email or web address] website for more useful information       Consultation note has been sent to the referring physician  Elayne Snare 07/05/2017, 2:57 PM   Note: This office note was prepared with Dragon voice recognition system technology. Any transcriptional errors that result from this process are unintentional.

## 2017-07-05 ENCOUNTER — Ambulatory Visit (INDEPENDENT_AMBULATORY_CARE_PROVIDER_SITE_OTHER): Payer: Medicare Other | Admitting: Endocrinology

## 2017-07-05 ENCOUNTER — Encounter: Payer: Self-pay | Admitting: Endocrinology

## 2017-07-05 VITALS — BP 140/80 | HR 72 | Ht 66.0 in | Wt 225.2 lb

## 2017-07-05 DIAGNOSIS — E669 Obesity, unspecified: Secondary | ICD-10-CM

## 2017-07-05 DIAGNOSIS — E1140 Type 2 diabetes mellitus with diabetic neuropathy, unspecified: Secondary | ICD-10-CM

## 2017-07-05 DIAGNOSIS — E1165 Type 2 diabetes mellitus with hyperglycemia: Secondary | ICD-10-CM

## 2017-07-05 DIAGNOSIS — E1169 Type 2 diabetes mellitus with other specified complication: Secondary | ICD-10-CM | POA: Diagnosis not present

## 2017-07-05 DIAGNOSIS — E7800 Pure hypercholesterolemia, unspecified: Secondary | ICD-10-CM

## 2017-07-05 DIAGNOSIS — IMO0002 Reserved for concepts with insufficient information to code with codable children: Secondary | ICD-10-CM

## 2017-07-05 LAB — GLUCOSE, POCT (MANUAL RESULT ENTRY): POC Glucose: 296 mg/dl — AB (ref 70–99)

## 2017-07-05 LAB — POCT GLYCOSYLATED HEMOGLOBIN (HGB A1C): Hemoglobin A1C: 10.6 % — AB (ref 4.0–5.6)

## 2017-07-05 MED ORDER — DULAGLUTIDE 0.75 MG/0.5ML ~~LOC~~ SOAJ
SUBCUTANEOUS | 2 refills | Status: DC
Start: 2017-07-05 — End: 2017-09-03

## 2017-07-05 MED ORDER — PREGABALIN 50 MG PO CAPS: 50.0000 mg | ORAL_CAPSULE | Freq: Three times a day (TID) | ORAL | 1 refills | Status: DC

## 2017-07-05 NOTE — Patient Instructions (Addendum)
Check blood sugars on waking up  4/7 days  Also check blood sugars about 2 hours after a meal and do this after different meals by rotation  Recommended blood sugar levels on waking up is 90-130 and about 2 hours after meal is 130-160  Please bring your blood sugar monitor to each visit, thank you . Humalog 20 before meals and adjust as directed  Start TRULICITYwith the pen as shown once weekly on the same day of the week.  You may inject in the stomach, thigh or arm as indicated in the brochure given.   You will feel fullness of the stomach with starting the medication and should try to keep the portions at meals small.   You may experience nausea in the first few days which usually gets better over time   If any questions or concerns are present call the office or the  [place] at [phone]. Also visit [email or web address] website for more useful information

## 2017-07-06 ENCOUNTER — Ambulatory Visit (INDEPENDENT_AMBULATORY_CARE_PROVIDER_SITE_OTHER): Payer: Medicare Other | Admitting: Pulmonary Disease

## 2017-07-06 ENCOUNTER — Encounter: Payer: Self-pay | Admitting: Pulmonary Disease

## 2017-07-06 DIAGNOSIS — G4733 Obstructive sleep apnea (adult) (pediatric): Secondary | ICD-10-CM | POA: Diagnosis not present

## 2017-07-06 DIAGNOSIS — J4520 Mild intermittent asthma, uncomplicated: Secondary | ICD-10-CM | POA: Diagnosis not present

## 2017-07-06 NOTE — Assessment & Plan Note (Signed)
Schedule home sleep study. Since she has lost 20 pounds and only had mild OSA, possible that she does not require CPAP anymore  The meantime, we will ask advance to check your machine out as to why it is stopping intermittently.

## 2017-07-06 NOTE — Progress Notes (Signed)
   Subjective:    Patient ID: [name], female    DOB: Jul 08, 1965, 52 y.o.   MRN: [id number]  HPI  52 year old female former smoker smoker, retired Therapist, sports, followed for mild sleep apnea and asthma/chronic bronchitis    She just got married 2 weeks ago, unfortunately her husband has advanced liver cancer and is undergoing chemotherapy. She complains of hot flashes and is established with a new PCP, does not have a GYN. Asthma symptoms have been stable, she uses Symbicort daily and albuterol has not been needed much at all.  [facility]  This seems to stop intermittently.  No mask or pressure issues. Download was reviewed which shows few missed nights which are on work nights that the machine malfunctions as per her, average pressure is 15 cm, on nights used compliance is more than 6 hours, no residual events with minimal leak  She has lost 20 pounds since her original study in 2015  Significant tests/ events reviewed  PSG 02/2013 -wt - 245 pounds -total sleep time 349 minutes, AHI 6 per hour, lowest desat 82% PFTs 06/2013 -moderate restriction, ratio normal  Review of Systems neg for any significant sore throat, dysphagia, itching, sneezing, nasal congestion or excess/ purulent secretions, fever, chills, sweats, unintended wt loss, pleuritic or exertional cp, hempoptysis, orthopnea pnd or change in chronic leg swelling. Also denies presyncope, palpitations, heartburn, abdominal pain, nausea, vomiting, diarrhea or change in bowel or urinary habits, dysuria,hematuria, rash, arthralgias, visual complaints, headache, numbness weakness or ataxia.     Objective:   Physical Exam   Gen. Pleasant, well-nourished, in no distress ENT - no thrush, no post nasal drip Neck: No JVD, no thyromegaly, no carotid bruits Lungs: no use of accessory muscles, no dullness to percussion, clear without rales or rhonchi  Cardiovascular: Rhythm regular, heart  sounds  normal, no murmurs or gallops, no peripheral edema Musculoskeletal: No deformities, no cyanosis or clubbing         Assessment & Plan:

## 2017-07-06 NOTE — Assessment & Plan Note (Signed)
Okay to stop Symbicort. Use albuterol on an as-needed basis and call us if you are using it more often

## 2017-07-06 NOTE — Patient Instructions (Signed)
Schedule home sleep study.  The meantime, we will ask advance to check your machine out as to why it is stopping intermittently.  Okay to stop Symbicort. Use albuterol on an as-needed basis and call us if you are using it more often

## 2017-07-07 ENCOUNTER — Encounter: Payer: Self-pay | Admitting: Family Medicine

## 2017-07-14 ENCOUNTER — Encounter: Payer: Self-pay | Admitting: *Deleted

## 2017-07-14 ENCOUNTER — Telehealth: Payer: Self-pay | Admitting: Family Medicine

## 2017-07-14 DIAGNOSIS — Z1239 Encounter for other screening for malignant neoplasm of breast: Secondary | ICD-10-CM

## 2017-07-14 NOTE — Telephone Encounter (Signed)
MMG orders placed as requested.

## 2017-07-14 NOTE — Telephone Encounter (Unsigned)
Copied from [place] [phone]. Topic: Quick Communication - See Telephone Encounter >> Jul 14, 2017 12:10 PM [name] wrote: CRM for notification. See Telephone encounter for: 07/14/17.  [facility] called in and they faxed over a request for progress note for a Medicare part B Audit   Best number [phone] Ref is pt and DOB

## 2017-07-14 NOTE — Telephone Encounter (Signed)
Copied from [place]. Topic: Referral - Request >> Jul 14, 2017 12:02 PM [name] wrote: Reason for CRM:  pt called in and requesting a referral for her yearly mammogram.  She would like to be referred to the breast center of [place]

## 2017-07-15 NOTE — Telephone Encounter (Signed)
Message sent to Medical Records

## 2017-07-16 ENCOUNTER — Telehealth: Payer: Self-pay

## 2017-07-16 NOTE — Telephone Encounter (Signed)
Copied from [place] [phone]. Topic: Quick Communication - See Telephone Encounter >> Jul 14, 2017 12:10 PM [name] wrote: CRM for notification. See Telephone encounter for: 07/14/17.  [facility] called in and they faxed over a request for progress note for a Medicare part B Audit   Best number [phone] Ref is pt and DOB

## 2017-07-16 NOTE — Telephone Encounter (Signed)
Appears this is not our patient.  Message returned to [facility].

## 2017-07-21 NOTE — Telephone Encounter (Signed)
Called and spoke with Chasta and she stated that she did not see a request for the patient at this time. She would do some research and find out if there was anything needed and if so she would fax request over to the office. No further assistance needed at this time.

## 2017-07-26 LAB — FECAL OCCULT BLOOD, GUAIAC: Fecal Occult Blood: NEGATIVE

## 2017-08-05 ENCOUNTER — Ambulatory Visit: Payer: Medicare Other

## 2017-08-10 ENCOUNTER — Encounter: Payer: Self-pay | Admitting: Family Medicine

## 2017-08-30 ENCOUNTER — Other Ambulatory Visit: Payer: Self-pay

## 2017-08-30 NOTE — Patient Outreach (Signed)
[facility]) Care Management  08/30/2017  Secilia Apps 05-Jun-1965 [id number]   Medication Adherence call to Mrs. Devine Herbin left a message for patient to call back patient is due on Simvastatin 40 mg. Mrs.  Domnique is showing past due under [place].   [place] Management Direct Dial [phone]  Fax [phone] [email or web address]

## 2017-09-01 DIAGNOSIS — G4733 Obstructive sleep apnea (adult) (pediatric): Secondary | ICD-10-CM | POA: Diagnosis not present

## 2017-09-02 ENCOUNTER — Telehealth: Payer: Self-pay | Admitting: Pulmonary Disease

## 2017-09-02 ENCOUNTER — Other Ambulatory Visit: Payer: Self-pay | Admitting: *Deleted

## 2017-09-02 DIAGNOSIS — G4733 Obstructive sleep apnea (adult) (pediatric): Secondary | ICD-10-CM

## 2017-09-02 NOTE — Telephone Encounter (Signed)
HST: 09/01/17  Face-to-face: 07/06/17  HST showed mild OSA averaging 11 apneas per hour  Called and spoke with pt letting her know the results of the HST.  Pt expressed understanding. Pt stated she needs new supplies of new face masks, filters, water filter, humidifier

## 2017-09-03 ENCOUNTER — Other Ambulatory Visit: Payer: Self-pay | Admitting: Endocrinology

## 2017-09-10 ENCOUNTER — Telehealth: Payer: Self-pay | Admitting: Pulmonary Disease

## 2017-09-10 NOTE — Telephone Encounter (Signed)
Spoke to [place] they have this order it was under a different name they will process it

## 2017-09-10 NOTE — Telephone Encounter (Signed)
Thank you, noted by triage Will sign off as nothing further is needed per [facility]

## 2017-09-10 NOTE — Telephone Encounter (Signed)
Order placed on 8/15 for new cpap supplies, and pt called [facility] to follow up on order, was told by rep that they haven't received the order.  Documentation on order in Epic.  PCCs please advise on status of order.  Thanks!

## 2017-09-10 NOTE — Telephone Encounter (Signed)
Have left a message for [employer]

## 2017-09-16 DIAGNOSIS — G4733 Obstructive sleep apnea (adult) (pediatric): Secondary | ICD-10-CM | POA: Diagnosis not present

## 2017-09-16 DIAGNOSIS — J449 Chronic obstructive pulmonary disease, unspecified: Secondary | ICD-10-CM | POA: Diagnosis not present

## 2017-12-03 ENCOUNTER — Emergency Department
Admission: EM | Admit: 2017-12-03 | Discharge: 2017-12-03 | Disposition: A | Discharge status: Home / Self Care | Payer: Medicare Other | Attending: Student in an Organized Health Care Education/Training Program | Admitting: Student in an Organized Health Care Education/Training Program

## 2017-12-03 ENCOUNTER — Other Ambulatory Visit: Payer: Self-pay

## 2017-12-03 DIAGNOSIS — F419 Anxiety disorder, unspecified: Secondary | ICD-10-CM | POA: Diagnosis not present

## 2017-12-03 DIAGNOSIS — I252 Old myocardial infarction: Secondary | ICD-10-CM | POA: Diagnosis not present

## 2017-12-03 DIAGNOSIS — E119 Type 2 diabetes mellitus without complications: Secondary | ICD-10-CM | POA: Diagnosis not present

## 2017-12-03 DIAGNOSIS — J449 Chronic obstructive pulmonary disease, unspecified: Secondary | ICD-10-CM | POA: Insufficient documentation

## 2017-12-03 DIAGNOSIS — Z794 Long term (current) use of insulin: Secondary | ICD-10-CM | POA: Diagnosis not present

## 2017-12-03 DIAGNOSIS — Z8673 Personal history of transient ischemic attack (TIA), and cerebral infarction without residual deficits: Secondary | ICD-10-CM | POA: Diagnosis not present

## 2017-12-03 DIAGNOSIS — J45909 Unspecified asthma, uncomplicated: Secondary | ICD-10-CM | POA: Diagnosis not present

## 2017-12-03 DIAGNOSIS — M5431 Sciatica, right side: Secondary | ICD-10-CM

## 2017-12-03 DIAGNOSIS — Z87891 Personal history of nicotine dependence: Secondary | ICD-10-CM | POA: Diagnosis not present

## 2017-12-03 DIAGNOSIS — Z79899 Other long term (current) drug therapy: Secondary | ICD-10-CM | POA: Insufficient documentation

## 2017-12-03 DIAGNOSIS — F329 Major depressive disorder, single episode, unspecified: Secondary | ICD-10-CM | POA: Insufficient documentation

## 2017-12-03 DIAGNOSIS — M79606 Pain in leg, unspecified: Secondary | ICD-10-CM | POA: Diagnosis present

## 2017-12-03 DIAGNOSIS — I10 Essential (primary) hypertension: Secondary | ICD-10-CM | POA: Insufficient documentation

## 2017-12-03 MED ORDER — PREDNISONE 50 MG PO TABS: 50.0000 mg | ORAL_TABLET | Freq: Every day | ORAL | 0 refills | Status: DC

## 2017-12-03 MED ORDER — DEXAMETHASONE SODIUM PHOSPHATE 10 MG/ML IJ SOLN
10.0000 mg | Freq: Once | INTRAMUSCULAR | Status: AC
  Administered 2017-12-03: 10 mg via INTRAMUSCULAR
  Filled 2017-12-03: qty 1

## 2017-12-03 MED ORDER — GABAPENTIN 300 MG PO CAPS: 300.0000 mg | ORAL_CAPSULE | Freq: Three times a day (TID) | ORAL | 2 refills | Status: DC

## 2017-12-03 MED ORDER — METHOCARBAMOL 500 MG PO TABS: 500.0000 mg | ORAL_TABLET | Freq: Four times a day (QID) | ORAL | 0 refills | Status: DC

## 2017-12-03 MED ORDER — ORPHENADRINE CITRATE 30 MG/ML IJ SOLN
60.0000 mg | Freq: Once | INTRAMUSCULAR | Status: AC
  Administered 2017-12-03: 60 mg via INTRAMUSCULAR
  Filled 2017-12-03: qty 2

## 2017-12-03 MED ORDER — MORPHINE SULFATE (PF) 4 MG/ML IV SOLN
4.0000 mg | Freq: Once | INTRAVENOUS | Status: AC
  Administered 2017-12-03: 4 mg via INTRAMUSCULAR
  Filled 2017-12-03: qty 1

## 2017-12-03 NOTE — ED Triage Notes (Signed)
Pt reports leg pain that started yesterday at home. Took Aleve but it did not help pain. Daughter drove pt to ED.

## 2017-12-03 NOTE — ED Provider Notes (Signed)
[facility] Emergency Department Provider Note  ____________________________________________  Time seen: Approximately 6:34 PM  I have reviewed the triage vital signs and the nursing notes.   HISTORY  Chief Complaint Leg Pain    HPI [name] is a 52 y.o. female who presents the emergency department complaining of pain radiating from the hip into her lower leg.  Patient reports that she has a long history of sciatica, but is not a candidate for neurosurgery given other comorbidities.  Patient has been seeing pain management and receiving regular epidural injections.  Patient recently moved to this area to be with her daughter after her husband died.  Patient is not established with care providers in the area and has not received her normal injections.  Patient is having an increase in pain consistent with her sciatica.  She is taking her chronic medications but states that no medications are helping.  Patient states that she used to be on gabapentin, but was switched to Lyrica.  When she lost her Medicaid, patient was unable to afford Lyrica.  Patient denies any recent injury.  No urinary symptoms.  No GI symptoms.  Pain is described as a burning/aching sensation from her hip radiating down her leg.  Patient has a significant medical history to include arthritis, asthma, COPD, previous strokes, diabetes, diabetic neuropathy, epilepsy, recurrent migraines, MI, nephrolithiasis.  No complaints with chronic medical problems with the exception of neuropathy/sciatica.    Past Medical History:  Diagnosis Date  . Anxiety   . Arthritis    joint pain   . Asthma   . COPD (chronic obstructive pulmonary disease) (HCC)    chronic bronchitis   . CVA 11/14/2007   Qualifier: Diagnosis of  By: Hassell Done [name], Tori Milks    . Depression   . Diabetes mellitus    since age 25; type 2 IDDM  . Diabetic neuropathy, painful ([place])    FEET AND HANDS  . Dilated aortic root ([place])     32m by echo 11/2015  . Dizziness    secondary to diabetes and hypertension   . Dysrhythmia    skipped beats  . Epilepsy idiopathic petit mal ([place]    last seizure 2012;controlled w/ topomax  . GERD (gastroesophageal reflux disease)   . Glaucoma    NOT ON ANY EYE DROPS   . Headache(784.0)    migraines   . Heart murmur    born with   . Hx of blood clots    hematomas removed from left side of brain from 129moto [id number]   . Hyperlipidemia   . Hypertension   . Legally blind    left eye   . MIGRAINE HEADACHE 09/14/2006   Qualifier: Diagnosis of  By: MarHassell DoneP, NykTori Milks . Myocardial infarction ([place]  hi of x 2  last one 2003   . Nephrolithiasis    frequent urination , urination at nite  PT SEEN IN ER 09/22/11 FOR BACK AND RT SIDED PAIN--HAS KNOWN STONE RT URETER AND UA IN ER SHOWED UTI  . Neuropathy    due to diabetes   . Pain 09/23/11   LOWER BACK AND RIGHT SIDE--PT HAS RIGHT URETERAL STONE  . Pneumonia    hx of 2009  . Pyelonephritis   . Seizures ([facility] . [facility] ap[facility]    [facility] ; mild  . Stress incontinence   . [facility]  [facility]  RESIDUAL LEFT LEG WEAKNESS--NO OTHER RESIDUAL PROBLEMS    Patient Active Problem List   Diagnosis Date Noted  . Hypokalemia 06/08/2017  . Overactive bladder 05/18/2017  . Asthma 06/22/2016  . Dilated aortic root ([place])   . Sacroiliac dysfunction 10/09/2014  . Chronic low back pain 09/03/2014  . CVA, old, hemiparesis ([place]) 09/03/2014  . OSA (obstructive sleep apnea) 05/15/2014  . Restrictive airway disease 05/15/2014  . Colitis 02/22/2014  . Type 2 diabetes, uncontrolled, with renal manifestation ([place]) 01/21/2014  . Chronic pain disorder 01/21/2014  . Migraine 09/07/2013  . DM type 2, uncontrolled, with neuropathy ([place]) 05/23/2013  . Mixed urge and stress incontinence 08/25/2011  . Ureteral stricture, right 08/25/2011  . Pyelonephritis 08/22/2011  . Nephrolithiasis 08/22/2011  .  Incisional hernia 05/10/2011  . Obesity, Class III, BMI 40-49.9 (morbid obesity) ([place]) 05/10/2011  . Tachycardia 05/08/2011  . BLINDNESS, [employer] [place] DEFINITION 09/14/2006  . Hyperlipemia 09/13/2006  . Anxiety and depression 09/13/2006  . Essential hypertension 09/13/2006  . MYOCARDIAL INFARCTION, HX OF 09/13/2006  . GERD 09/13/2006  . Seizure disorder ([place]) 09/13/2006    Past Surgical History:  Procedure Laterality Date  . BRAIN HEMATOMA EVACUATION     five procedures total, first procedure when 39 months old, last at 52 years of age.  . CYSTOSCOPY W/ RETROGRADES  09/24/2011   Procedure: CYSTOSCOPY WITH RETROGRADE PYELOGRAM;  Surgeon: Malka So, MD;  Location: [facility];  Service: Urology;  Laterality: Right;  . CYSTOSCOPY WITH URETEROSCOPY  09/24/2011   Procedure: CYSTOSCOPY WITH URETEROSCOPY;  Surgeon: Malka So, MD;  Location: [facility];  Service: Urology;  Laterality: Right;  Balloon dilation right ureter   . CYSTOSCOPY/RETROGRADE/URETEROSCOPY  08/24/2011   Procedure: CYSTOSCOPY/RETROGRADE/URETEROSCOPY;  Surgeon: Molli Hazard, MD;  Location: [facility];  Service: Urology;  Laterality: Right;  Cysto, Right retrograde Pyelogram, right stent placement.   Fatima Blank HERNIA REPAIR  05/05/2011   Procedure: LAPAROSCOPIC INCISIONAL HERNIA;  Surgeon: Rolm Bookbinder, MD;  Location: [facility];  Service: General;  Laterality: N/A;  . kidney stone removal    . MASS EXCISION  05/05/2011   Procedure: EXCISION MASS;  Surgeon: Rolm Bookbinder, MD;  Location: [facility];  Service: General;  Laterality: Right;  . PCNL    . RETINAL DETACHMENT SURGERY Left 1990  . SHUNT REMOVAL     shunt inserted at age 66 removed at age 27   . VAGINAL HYSTERECTOMY  1996    Prior to Admission medications   Medication Sig Start Date End Date Taking? Authorizing Provider  albuterol (PROAIR HFA) 108 (90 Base) MCG/ACT inhaler INHALE 2 PUFFS BY MOUTH EVERY 6 HOURS AS NEEDED FOR WHEEZING OR SHORTNESS OF BREATH 07/30/16    Lauree Chandler, NP  AMBULATORY NON FORMULARY MEDICATION Medication Name: Muscle and Joint Vanishing Scent Gel- Apply as needed to skin    [provider]  baclofen (LIORESAL) 10 MG tablet Take 1 tablet (10 mg total) by mouth 3 (three) times daily. 06/08/17   [place], Betty G, MD  Blood Glucose Monitoring Suppl (ONETOUCH VERIO) w/Device KIT 1 kit by Does not apply route 3 (three) times daily. Dx: E11.29 08/04/16   Reed, Tiffany L, DO  Calcium Carbonate-Vitamin D (CALCIUM-VITAMIN D3 PO) D3 500- calcium 630 mg Take 1 capsule by mouth once daily    [provider]  clopidogrel (PLAVIX) 75 MG tablet Take 1 tablet (75 mg total) by mouth daily. 05/20/17   [place], Betty G, MD  cycloSPORINE (RESTASIS) 0.05 % ophthalmic  emulsion Place 2 drops into both eyes 2 (two) times daily.    [provider]  fluorometholone (FML) 0.1 % ophthalmic suspension Place 2 drops into both eyes 2 (two) times daily.    [provider]  fluticasone (FLONASE) 50 MCG/ACT nasal spray Place 2 sprays into both nostrils daily. 07/30/16   Lauree Chandler, NP  gabapentin (NEURONTIN) 300 MG capsule Take 1 capsule (300 mg total) by mouth 3 (three) times daily. 12/03/17 12/03/18  [place] Cassetta, Charline Bills, [name]  glucose blood (ONETOUCH VERIO) test strip 1 each by Other route 3 (three) times daily. E11.29 07/30/16   Lauree Chandler, NP  Insulin Glargine (TOUJEO SOLOSTAR) 300 UNIT/ML SOPN Inject 100 Units into the skin daily. 05/20/17   [place], Betty G, MD  insulin lispro (HUMALOG KWIKPEN) 100 UNIT/ML KiwkPen Inject 10 units with each meal plus correction (ISF 25, target 150) Patient taking differently: Inject 20 Units into the skin 3 (three) times daily. Inject 20 units with each meal plus correction (ISF 25, target 150) 05/20/17   [place], Betty G, MD  Insulin Pen Needle (EASY TOUCH PEN NEEDLES) 31G X 8 MM MISC Use as directed in giving insulin. Dx: E11.29 07/30/16   Lauree Chandler, NP  losartan (COZAAR) 50 MG  tablet Take 1 tablet (50 mg total) by mouth daily. 06/08/17   [place], Betty G, MD  methocarbamol (ROBAXIN) 500 MG tablet Take 1 tablet (500 mg total) by mouth 4 (four) times daily. 12/03/17   [place]aranza Krider, Charline Bills, [name]  mirabegron ER (MYRBETRIQ) 50 MG TB24 tablet Take 1 tablet (50 mg total) by mouth daily. 06/08/17   [place], Betty G, MD  ondansetron (ZOFRAN-ODT) 4 MG disintegrating tablet TAKE 1 TABLET BY MOUTH EVERY 8 HOURS AS NEEDED FOR NAUSEA OR VOMITING 07/30/16   Lauree Chandler, NP  [facility] DELICA LANCETS 76P MISC Use to check sugar three times daily as directed.Dx: E11.29; E11.40 10/21/16   Gildardo Cranker, DO  pantoprazole (PROTONIX) 40 MG tablet Take 1 tablet (40 mg total) by mouth daily. 06/08/17   [place], Betty G, MD  polyethylene glycol [facility] Floria Raveling) packet Take 17 g by mouth daily as needed for mild constipation. 07/30/16   Lauree Chandler, NP  potassium chloride SA (K-DUR,KLOR-CON) 20 MEQ tablet Take 2 tablets (40 mEq total) by mouth daily. 06/08/17   [place], Betty G, MD  predniSONE (DELTASONE) 50 MG tablet Take 1 tablet (50 mg total) by mouth daily with breakfast. 12/03/17   Angeletta Goelz, Charline Bills, [name]  pregabalin (LYRICA) 50 MG capsule Take 1 capsule (50 mg total) by mouth 3 (three) times daily. 07/05/17   Elayne Snare, MD  ranitidine (ZANTAC) 150 MG tablet Take 1 tablet (150 mg total) by mouth 2 (two) times daily. 06/08/17   [place], Betty G, MD  simvastatin (ZOCOR) 40 MG tablet Take 1 tablet (40 mg total) by mouth daily. 05/20/17   [place], Betty G, MD  terconazole (TERAZOL 7) 0.4 % vaginal cream Place 1 applicator vaginally at bedtime. 05/18/17   [place], Betty G, MD  topiramate (TOPAMAX) 200 MG tablet Take 1 tablet (200 mg total) by mouth 2 (two) times daily. 05/20/17   [place], Betty G, MD  TRULICITY 9.50 DT/2.6ZT SOPN INJECT THE CONTENTS OF 1  PEN SUBCUTANEOUSLY WEEKLY  AS DIRECTED 09/03/17   Elayne Snare, MD  verapamil (VERELAN PM) 240 MG 24 hr capsule Take 1 capsule (240 mg total) by  mouth at bedtime. 06/08/17   [place], Betty G, MD  Allergies Codeine and Lactose intolerance (gi)  Family History  Problem Relation Age of Onset  . Uterine cancer Mother   . Hypertension Mother   . Cancer Mother   . Brain cancer Maternal Grandmother   . Hypertension Maternal Grandmother   . Birth defects Daughter   . Hypertension Daughter   . Cirrhosis Maternal Grandfather   . ADD / ADHD Maternal Grandfather   . Birth defects Maternal Grandfather   . Diabetes Maternal Grandfather   . Anesthesia problems Neg Hx   . Colon cancer Neg Hx   . Esophageal cancer Neg Hx   . Pancreatic cancer Neg Hx     Social History Social History   Tobacco Use  . Smoking status: Former Smoker    Packs/day: 1.00    Years: 15.00    Pack years: 15.00    Types: Cigarettes    Last attempt to quit: 01/20/1987    Years since quitting: 30.8  . Smokeless tobacco: Never Used  . Tobacco comment: quit more than 20years  Substance Use Topics  . Alcohol use: Yes    Alcohol/week: 0.0 standard drinks    Comment: 1 glass weekly  . Drug use: No    Comment: hx of marijuana use no longer uses      Review of Systems  Constitutional: No fever/chills Eyes: No visual changes. No discharge ENT: No upper respiratory complaints. Cardiovascular: no chest pain. Respiratory: no cough. No SOB. Gastrointestinal: No abdominal pain.  No nausea, no vomiting.  No diarrhea.  No constipation. Genitourinary: Negative for dysuria. No hematuria Musculoskeletal: Positive for pain radiating from right hip into the foot. Skin: Negative for rash, abrasions, lacerations, ecchymosis. Neurological: Negative for headaches, focal weakness or numbness. 10-point ROS otherwise negative.  ____________________________________________   PHYSICAL EXAM:  VITAL SIGNS: ED Triage Vitals  Enc Vitals Group     BP      Pulse      Resp      Temp      Temp src      SpO2      Weight      Height      Head Circumference      Peak  Flow      Pain Score      Pain Loc      Pain Edu?      Excl. in [place]?      Constitutional: Alert and oriented. Well appearing and in no acute distress. Eyes: Conjunctivae are normal. PERRL. EOMI. Head: Atraumatic. Neck: No stridor.    Cardiovascular: Normal rate, regular rhythm. Normal S1 and S2.  Good peripheral circulation. Respiratory: Normal respiratory effort without tachypnea or retractions. Lungs CTAB. Good air entry to the bases with no decreased or absent breath sounds. Gastrointestinal: Bowel sounds 4 quadrants. Soft and nontender to palpation. No guarding or rigidity. No palpable masses. No distention. No CVA tenderness. Musculoskeletal: Full range of motion to all extremities. No gross deformities appreciated.  Visualization of the lumbar spine reveals no deformity or visible signs of trauma.  Patient is tender palpation of the right paraspinal muscle group but no tenderness midline.  Tender to palpation right-sided sciatic notch, diffuse tenderness in dermatomal distribution throughout the right lower extremity.  No loss of range of motion to the right hip, right knee, right ankle.  Examination of the hip, knee, ankle is unremarkable.  Dorsalis pedis pulse intact bilateral lower extremities.  Sensation intact and equal in all dermatomal distributions. Neurologic:  Normal speech and language.  No gross focal neurologic deficits are appreciated.  Skin:  Skin is warm, dry and intact. No rash noted. Psychiatric: Mood and affect are normal. Speech and behavior are normal. Patient exhibits appropriate insight and judgement.   ____________________________________________   LABS (all labs ordered are listed, but only abnormal results are displayed)  Labs Reviewed - No data to display ____________________________________________  EKG   ____________________________________________  RADIOLOGY   No results  found.  ____________________________________________    PROCEDURES  Procedure(s) performed:    Procedures    Medications  dexamethasone (DECADRON) injection 10 mg (has no administration in time range)  orphenadrine (NORFLEX) injection 60 mg (has no administration in time range)  morphine 4 MG/ML injection 4 mg (has no administration in time range)     ____________________________________________   INITIAL IMPRESSION / ASSESSMENT AND PLAN / ED COURSE  Pertinent labs & imaging results that were available during my care of the patient were reviewed by me and considered in my medical decision making (see chart for details).  Review of the [place] CSRS was performed in accordance of the [place] prior to dispensing any controlled drugs.      Patient's diagnosis is consistent with sciatica.  Patient presents emergency department with symptoms consistent with sciatica.  This is a long-standing history of same.  She has been seeing pain management with routine epidural injections.  Patient recently moved and is not established with pain management here.  She has not received her normal epidural injections.  At this time, patient will be placed on a short course of steroid, muscle relaxer, restarted on her gabapentin.  No indication for labs or imaging at this time.  Follow-up with primary care which she will establish in the next 1.5 months..  Patient is given ED precautions to return to the ED for any worsening or new symptoms.     ____________________________________________  FINAL CLINICAL IMPRESSION(S) / ED DIAGNOSES  Final diagnoses:  Sciatica of right side      NEW MEDICATIONS STARTED DURING THIS VISIT:  ED Discharge Orders         Ordered    predniSONE (DELTASONE) 50 MG tablet  Daily with breakfast     12/03/17 1840    methocarbamol (ROBAXIN) 500 MG tablet  4 times daily     12/03/17 1840    gabapentin (NEURONTIN) 300 MG capsule  3 times daily     12/03/17 1840               This chart was dictated using voice recognition software/Dragon. Despite best efforts to proofread, errors can occur which can change the meaning. Any change was purely unintentional.    Darletta Moll, [name] 12/03/17 1841    Merlyn Lot, MD 12/03/17 [phone]

## 2017-12-06 ENCOUNTER — Telehealth: Payer: Self-pay | Admitting: Endocrinology

## 2017-12-06 NOTE — Telephone Encounter (Signed)
Patient's husband passed away. Patient had to move to [place]. Patient requests can she be referred to an Endocrinologist in or near [place]. Please call patient at ph# [phone].

## 2017-12-06 NOTE — Telephone Encounter (Signed)
Spoke with the patient and she will have PCP transfer her

## 2017-12-06 NOTE — Telephone Encounter (Signed)
Please advise 

## 2017-12-06 NOTE — Telephone Encounter (Signed)
She will have to ask for transfer with PCP

## 2017-12-07 ENCOUNTER — Other Ambulatory Visit: Payer: Self-pay

## 2017-12-07 NOTE — Patient Outreach (Signed)
[facility]) Care Management  12/07/2017  Bevely Hackbart 1965-08-08 [id number]   Medication Adherence call to Mrs. Pearley Oakley left a message for patient to call back patient is due on Losartan 50 mg under [place].   [facility] Management Direct Dial [phone]  Fax [phone] [email or web address]

## 2017-12-14 NOTE — Telephone Encounter (Signed)
error 

## 2017-12-22 DIAGNOSIS — J449 Chronic obstructive pulmonary disease, unspecified: Secondary | ICD-10-CM | POA: Diagnosis not present

## 2017-12-22 DIAGNOSIS — G4733 Obstructive sleep apnea (adult) (pediatric): Secondary | ICD-10-CM | POA: Diagnosis not present

## 2017-12-27 ENCOUNTER — Other Ambulatory Visit: Payer: Self-pay

## 2017-12-27 NOTE — Patient Outreach (Signed)
[facility] Care Management  12/27/2017  [name] Nov 14, 1965 [id number]   Medication Adherence call to Mrs. [name] patient did not answer patient is due on Losartan 50 mg. Mrs. [name] is showing past due under [place].   [place] [facility] Management Direct Dial [phone]  Fax [phone] [email or web address]

## 2018-01-24 ENCOUNTER — Ambulatory Visit (INDEPENDENT_AMBULATORY_CARE_PROVIDER_SITE_OTHER): Payer: Medicare HMO | Admitting: Family Medicine

## 2018-01-24 ENCOUNTER — Encounter: Payer: Self-pay | Admitting: Family Medicine

## 2018-01-24 ENCOUNTER — Encounter

## 2018-01-24 VITALS — BP 128/84 | HR 99 | Temp 98.2°F | Resp 16 | Ht 66.0 in | Wt 220.9 lb

## 2018-01-24 DIAGNOSIS — E1142 Type 2 diabetes mellitus with diabetic polyneuropathy: Secondary | ICD-10-CM

## 2018-01-24 DIAGNOSIS — E1165 Type 2 diabetes mellitus with hyperglycemia: Secondary | ICD-10-CM | POA: Diagnosis not present

## 2018-01-24 DIAGNOSIS — J4520 Mild intermittent asthma, uncomplicated: Secondary | ICD-10-CM

## 2018-01-24 DIAGNOSIS — R51 Headache: Secondary | ICD-10-CM

## 2018-01-24 DIAGNOSIS — G894 Chronic pain syndrome: Secondary | ICD-10-CM

## 2018-01-24 DIAGNOSIS — I10 Essential (primary) hypertension: Secondary | ICD-10-CM

## 2018-01-24 DIAGNOSIS — E782 Mixed hyperlipidemia: Secondary | ICD-10-CM

## 2018-01-24 DIAGNOSIS — Z23 Encounter for immunization: Secondary | ICD-10-CM

## 2018-01-24 DIAGNOSIS — J984 Other disorders of lung: Secondary | ICD-10-CM | POA: Diagnosis not present

## 2018-01-24 DIAGNOSIS — K219 Gastro-esophageal reflux disease without esophagitis: Secondary | ICD-10-CM

## 2018-01-24 DIAGNOSIS — M545 Low back pain, unspecified: Secondary | ICD-10-CM

## 2018-01-24 DIAGNOSIS — E876 Hypokalemia: Secondary | ICD-10-CM | POA: Diagnosis not present

## 2018-01-24 DIAGNOSIS — B373 Candidiasis of vulva and vagina: Secondary | ICD-10-CM

## 2018-01-24 DIAGNOSIS — N3946 Mixed incontinence: Secondary | ICD-10-CM

## 2018-01-24 DIAGNOSIS — G4733 Obstructive sleep apnea (adult) (pediatric): Secondary | ICD-10-CM | POA: Diagnosis not present

## 2018-01-24 DIAGNOSIS — E559 Vitamin D deficiency, unspecified: Secondary | ICD-10-CM | POA: Insufficient documentation

## 2018-01-24 DIAGNOSIS — G8929 Other chronic pain: Secondary | ICD-10-CM

## 2018-01-24 DIAGNOSIS — G40909 Epilepsy, unspecified, not intractable, without status epilepticus: Secondary | ICD-10-CM

## 2018-01-24 DIAGNOSIS — I252 Old myocardial infarction: Secondary | ICD-10-CM | POA: Diagnosis not present

## 2018-01-24 DIAGNOSIS — IMO0002 Reserved for concepts with insufficient information to code with codable children: Secondary | ICD-10-CM

## 2018-01-24 DIAGNOSIS — I69359 Hemiplegia and hemiparesis following cerebral infarction affecting unspecified side: Secondary | ICD-10-CM | POA: Diagnosis not present

## 2018-01-24 DIAGNOSIS — E1129 Type 2 diabetes mellitus with other diabetic kidney complication: Secondary | ICD-10-CM

## 2018-01-24 DIAGNOSIS — N3281 Overactive bladder: Secondary | ICD-10-CM

## 2018-01-24 DIAGNOSIS — B3731 Acute candidiasis of vulva and vagina: Secondary | ICD-10-CM

## 2018-01-24 DIAGNOSIS — R519 Headache, unspecified: Secondary | ICD-10-CM

## 2018-01-24 MED ORDER — PANTOPRAZOLE SODIUM 40 MG PO TBEC: 40.0000 mg | DELAYED_RELEASE_TABLET | Freq: Every day | ORAL | 0 refills | Status: DC

## 2018-01-24 MED ORDER — ALBUTEROL SULFATE HFA 108 (90 BASE) MCG/ACT IN AERS: INHALATION_SPRAY | RESPIRATORY_TRACT | 3 refills | Status: DC

## 2018-01-24 MED ORDER — CLOPIDOGREL BISULFATE 75 MG PO TABS: 75.0000 mg | ORAL_TABLET | Freq: Every day | ORAL | 0 refills | Status: DC

## 2018-01-24 MED ORDER — LOSARTAN POTASSIUM 50 MG PO TABS: 50.0000 mg | ORAL_TABLET | Freq: Every day | ORAL | 0 refills | Status: DC

## 2018-01-24 MED ORDER — MIRABEGRON ER 50 MG PO TB24: 50.0000 mg | ORAL_TABLET | Freq: Every day | ORAL | 0 refills | Status: DC

## 2018-01-24 MED ORDER — BUDESONIDE-FORMOTEROL FUMARATE 160-4.5 MCG/ACT IN AERO: 2.0000 | INHALATION_SPRAY | Freq: Two times a day (BID) | RESPIRATORY_TRACT | 3 refills | Status: DC

## 2018-01-24 MED ORDER — SIMVASTATIN 40 MG PO TABS: 40.0000 mg | ORAL_TABLET | Freq: Every day | ORAL | 0 refills | Status: DC

## 2018-01-24 MED ORDER — GABAPENTIN 300 MG PO CAPS: 300.0000 mg | ORAL_CAPSULE | Freq: Three times a day (TID) | ORAL | 0 refills | Status: DC

## 2018-01-24 MED ORDER — TOPIRAMATE 200 MG PO TABS: 200.0000 mg | ORAL_TABLET | Freq: Two times a day (BID) | ORAL | 0 refills | Status: DC

## 2018-01-24 MED ORDER — POTASSIUM CHLORIDE CRYS ER 20 MEQ PO TBCR: 40.0000 meq | EXTENDED_RELEASE_TABLET | Freq: Every day | ORAL | 0 refills | Status: DC

## 2018-01-24 MED ORDER — FAMOTIDINE 40 MG PO TABS: 40.0000 mg | ORAL_TABLET | Freq: Every day | ORAL | 0 refills | Status: DC

## 2018-01-24 MED ORDER — TERCONAZOLE 0.4 % VA CREA: 1.0000 | TOPICAL_CREAM | Freq: Every day | VAGINAL | 0 refills | Status: DC

## 2018-01-24 NOTE — Progress Notes (Signed)
Name: [name]   MRN: [id number]    DOB: [birth year]   Date:01/24/2018       Progress Note  Subjective  Chief Complaint  Chief Complaint  Patient presents with  . Establish Care  . Diabetes  . Hypertension  . Hyperlipidemia    HPI  Social: Her husband passed away 10/15/17, she was kicked out of her apartment in [place] due to lack of income, and she is now living with her daughter - has plans to get her own place in the next few weeks.  During this move she lost a lot of her medications.  Asthma/OSA/Restrictive Airway Disease: Was seeing Dr. Elsworth Soho in [facility], needs new referral.  Using CPAP nightly and is very compliant. She is using her albuterol three times a day.  She has been out of her symbicort since 10/16/22 - we will refill this today.  Denies shortness of breath, endorses some wheezing and coughing.  HTN: Was on verapamil at night, but it was too expensive - out since 16-Oct-2022. She has been avoiding salt completely. She is taking losartan 43m and BP is at goal today. She denies chest pain, shortness of breath, BLE edema.    Hypokalemia: She takes potassium supplement for low levels - she is not on diuretic at this time. 447m once daily.  HLD: No myalgia, no chest pain, shortness of breath.  Taking simvastatin daily.  History of CVA: Had 3 strokes back to back in 2003, between 2003-2013 she had 8 strokes. LEFT side still weak sometimes - grip is weak, function of left hand is diminished. She has been on Plavix 75103mor years now. Denies signs or symptoms of bleeding.  We will refer to neurology today.  Epilepsy - [place], notes she has history of seizures in her sleep as well.  No seizures in about 3 years.  We will refer to neurology today.  Taking topamax 200m76mD and this seems to work well for her.   Headaches: She is taking tylenol only, helps sometimes. She is also on topamax.  She is getting 2-3 headaches a day.  Pain is frontal and  bilateral. We will refer to neurology today  DM2 with CKD and neuropathy: 110 814ly, Trulicity weekly, Humalog 20 units TID.  She was seeing endocrinology, but lost follow up - we will refer today.  She has noticed BG's have been elevated lately in the 200's.  She has neuropathy in the LEFT side - this has been ongoing for about 30 years.  Denies polyphagia, polydipsia, or polyuria.   Morbid Obesity: She has been losing weight - watching what she eats, and is walking a lot more.  She is down about 40lbs.  She is congratulated on this accomplishment and encouraged to continue.  Incontinence/Overactive Bladder: Taking Myrbetriq - she notes sometimes she can control her urine and sometimes she can't. She has seen urology in the past.  She needs refill of Myrbetriq today.  GERD: She is taking Zantac 150mg32m pantoprazole.   Advised to stop Zantac and take pepcid instead. She watches her diet closely, takes zofran every now and then for nausea. No blood in stool, no dark and tarry stools.  Chronic Pain/Low Back Pain: She is taking methocarbamol, once this runs out she was told to switch back to baclofen.   Recurrent Vaginal Candidiasis: Uses terconazole PRN. She is doing well right now but requests refill.  Patient Active Problem List   Diagnosis Date  Noted  . Hypokalemia 06/08/2017  . Overactive bladder 05/18/2017  . Asthma 06/22/2016  . Dilated aortic root ([place])   . Sacroiliac dysfunction 10/09/2014  . Chronic low back pain 09/03/2014  . CVA, old, hemiparesis ([place]) 09/03/2014  . OSA (obstructive sleep apnea) 05/15/2014  . Restrictive airway disease 05/15/2014  . Colitis 02/22/2014  . Type 2 diabetes, uncontrolled, with renal manifestation ([place]) 01/21/2014  . Chronic pain disorder 01/21/2014  . Migraine 09/07/2013  . DM type 2, uncontrolled, with neuropathy ([place]) 05/23/2013  . Mixed urge and stress incontinence 08/25/2011  . Ureteral stricture, right 08/25/2011  . Pyelonephritis  08/22/2011  . Nephrolithiasis 08/22/2011  . Incisional hernia 05/10/2011  . Obesity, Class III, BMI 40-49.9 (morbid obesity) ([place]) 05/10/2011  . Tachycardia 05/08/2011  . BLINDNESS, [place] DEFINITION 09/14/2006  . Hyperlipemia 09/13/2006  . Anxiety and depression 09/13/2006  . Essential hypertension 09/13/2006  . MYOCARDIAL INFARCTION, HX OF 09/13/2006  . GERD 09/13/2006  . Seizure disorder ([place]) 09/13/2006    Past Surgical History:  Procedure Laterality Date  . BRAIN HEMATOMA EVACUATION     five procedures total, first procedure when 47 months old, last at 53 years of age.  . CYSTOSCOPY W/ RETROGRADES  09/24/2011   Procedure: CYSTOSCOPY WITH RETROGRADE PYELOGRAM;  Surgeon: Malka So, MD;  Location: [facility];  Service: Urology;  Laterality: Right;  . CYSTOSCOPY WITH URETEROSCOPY  09/24/2011   Procedure: CYSTOSCOPY WITH URETEROSCOPY;  Surgeon: Malka So, MD;  Location: [facility];  Service: Urology;  Laterality: Right;  Balloon dilation right ureter   . CYSTOSCOPY/RETROGRADE/URETEROSCOPY  08/24/2011   Procedure: CYSTOSCOPY/RETROGRADE/URETEROSCOPY;  Surgeon: Molli Hazard, MD;  Location: [facility];  Service: Urology;  Laterality: Right;  Cysto, Right retrograde Pyelogram, right stent placement.   Fatima Blank HERNIA REPAIR  05/05/2011   Procedure: LAPAROSCOPIC INCISIONAL HERNIA;  Surgeon: Rolm Bookbinder, MD;  Location: [facility];  Service: General;  Laterality: N/A;  . kidney stone removal    . MASS EXCISION  05/05/2011   Procedure: EXCISION MASS;  Surgeon: Rolm Bookbinder, MD;  Location: [facility];  Service: General;  Laterality: Right;  . PCNL    . RETINAL DETACHMENT SURGERY Left 1990  . SHUNT REMOVAL     shunt inserted at age 51 removed at age 66   . VAGINAL HYSTERECTOMY  1996    Family History  Problem Relation Age of Onset  . Uterine cancer Mother   . Hypertension Mother   . Cancer Mother   . Brain cancer Maternal Grandmother   . Hypertension Maternal Grandmother   . Birth  defects Daughter   . Hypertension Daughter   . Cirrhosis Maternal Grandfather   . ADD / ADHD Maternal Grandfather   . Birth defects Maternal Grandfather   . Diabetes Maternal Grandfather   . Anesthesia problems Neg Hx   . Colon cancer Neg Hx   . Esophageal cancer Neg Hx   . Pancreatic cancer Neg Hx     Social History   Socioeconomic History  . Marital status: Widowed    Spouse name: Not on file  . Number of children: 2  . Years of education: Not on file  . Highest education level: Not on file  Occupational History  . Occupation: disabled  Social Needs  . Financial resource strain: Not on file  . Food insecurity:    Worry: Never true    Inability: Never true  . Transportation needs:    Medical: No    Non-medical: No  Tobacco Use  . Smoking status: Former Smoker    Packs/day: 1.00    Years: 15.00    Pack years: 15.00    Types: Cigarettes    Last attempt to quit: 01/20/1987    Years since quitting: 31.0  . Smokeless tobacco: Never Used  . Tobacco comment: quit more than 20years  Substance and Sexual Activity  . Alcohol use: Yes    Alcohol/week: 0.0 standard drinks    Comment: 1 glass weekly  . Drug use: No    Comment: hx of marijuana use no longer uses   . Sexual activity: Yes    Partners: Male    Birth control/protection: Surgical  Lifestyle  . Physical activity:    Days per week: 2 days    Minutes per session: 30 min  . Stress: Only a little  Relationships  . Social connections:    Talks on phone: More than three times a week    Gets together: More than three times a week    Attends religious service: More than 4 times per year    Active member of club or organization: Yes    Attends meetings of clubs or organizations: More than 4 times per year    Relationship status: Widowed  . Intimate partner violence:    Fear of current or ex partner: No    Emotionally abused: No    Physically abused: No    Forced sexual activity: No  Other Topics Concern  . Not  on file  Social History Narrative  . Not on file     Current Outpatient Medications:  .  AMBULATORY NON FORMULARY MEDICATION, Medication Name: Muscle and Joint Vanishing Scent Gel- Apply as needed to skin, Disp: , Rfl:  .  baclofen (LIORESAL) 10 MG tablet, Take 1 tablet (10 mg total) by mouth 3 (three) times daily., Disp: 90 tablet, Rfl: 6 .  Blood Glucose Monitoring Suppl (ONETOUCH VERIO) w/Device KIT, 1 kit by Does not apply route 3 (three) times daily. Dx: E11.29, Disp: 1 kit, Rfl: 0 .  clopidogrel (PLAVIX) 75 MG tablet, Take 1 tablet (75 mg total) by mouth daily., Disp: 90 tablet, Rfl: 3 .  cycloSPORINE (RESTASIS) 0.05 % ophthalmic emulsion, Place 2 drops into both eyes 2 (two) times daily., Disp: , Rfl:  .  fluorometholone (FML) 0.1 % ophthalmic suspension, Place 2 drops into both eyes 2 (two) times daily., Disp: , Rfl:  .  fluticasone (FLONASE) 50 MCG/ACT nasal spray, Place 2 sprays into both nostrils daily., Disp: 16 g, Rfl: 3 .  gabapentin (NEURONTIN) 300 MG capsule, Take 1 capsule (300 mg total) by mouth 3 (three) times daily., Disp: 90 capsule, Rfl: 2 .  glucose blood (ONETOUCH VERIO) test strip, 1 each by Other route 3 (three) times daily. E11.29, Disp: 300 each, Rfl: 3 .  Insulin Glargine (TOUJEO SOLOSTAR) 300 UNIT/ML SOPN, Inject 100 Units into the skin daily., Disp: 27 mL, Rfl: 1 .  insulin lispro (HUMALOG KWIKPEN) 100 UNIT/ML KiwkPen, Inject 10 units with each meal plus correction (ISF 25, target 150) (Patient taking differently: Inject 20 Units into the skin 3 (three) times daily. Inject 20 units with each meal plus correction (ISF 25, target 150)), Disp: 30 mL, Rfl: 1 .  Insulin Pen Needle (EASY TOUCH PEN NEEDLES) 31G X 8 MM MISC, Use as directed in giving insulin. Dx: E11.29, Disp: 400 each, Rfl: 3 .  losartan (COZAAR) 50 MG tablet, Take 1 tablet (50 mg total) by mouth daily., Disp: 30  tablet, Rfl: 6 .  methocarbamol (ROBAXIN) 500 MG tablet, Take 1 tablet (500 mg total) by mouth  4 (four) times daily., Disp: 16 tablet, Rfl: 0 .  mirabegron ER (MYRBETRIQ) 50 MG TB24 tablet, Take 1 tablet (50 mg total) by mouth daily., Disp: 90 tablet, Rfl: 11 .  ondansetron (ZOFRAN-ODT) 4 MG disintegrating tablet, TAKE 1 TABLET BY MOUTH EVERY 8 HOURS AS NEEDED FOR NAUSEA OR VOMITING, Disp: 30 tablet, Rfl: 1 .  ONETOUCH DELICA LANCETS 99I MISC, Use to check sugar three times daily as directed.Dx: E11.29; E11.40, Disp: 300 each, Rfl: 3 .  pantoprazole (PROTONIX) 40 MG tablet, Take 1 tablet (40 mg total) by mouth daily., Disp: 30 tablet, Rfl: 6 .  polyethylene glycol (MIRALAX / GLYCOLAX) packet, Take 17 g by mouth daily as needed for mild constipation., Disp: 100 each, Rfl: 3 .  potassium chloride SA (K-DUR,KLOR-CON) 20 MEQ tablet, Take 2 tablets (40 mEq total) by mouth daily., Disp: 60 tablet, Rfl: 6 .  predniSONE (DELTASONE) 50 MG tablet, Take 1 tablet (50 mg total) by mouth daily with breakfast., Disp: 5 tablet, Rfl: 0 .  pregabalin (LYRICA) 50 MG capsule, Take 1 capsule (50 mg total) by mouth 3 (three) times daily., Disp: 270 capsule, Rfl: 1 .  ranitidine (ZANTAC) 150 MG tablet, Take 1 tablet (150 mg total) by mouth 2 (two) times daily., Disp: 60 tablet, Rfl: 6 .  simvastatin (ZOCOR) 40 MG tablet, Take 1 tablet (40 mg total) by mouth daily., Disp: 90 tablet, Rfl: 1 .  terconazole (TERAZOL 7) 0.4 % vaginal cream, Place 1 applicator vaginally at bedtime., Disp: 45 g, Rfl: 1 .  topiramate (TOPAMAX) 200 MG tablet, Take 1 tablet (200 mg total) by mouth 2 (two) times daily., Disp: 180 tablet, Rfl: 1 .  TRULICITY 3.38 SN/0.5LZ SOPN, INJECT THE CONTENTS OF 1  PEN SUBCUTANEOUSLY WEEKLY  AS DIRECTED, Disp: 6 mL, Rfl: 1 .  verapamil (VERELAN PM) 240 MG 24 hr capsule, Take 1 capsule (240 mg total) by mouth at bedtime., Disp: 30 capsule, Rfl: 11 .  albuterol (PROAIR HFA) 108 (90 Base) MCG/ACT inhaler, INHALE 2 PUFFS BY MOUTH EVERY 6 HOURS AS NEEDED FOR WHEEZING OR SHORTNESS OF BREATH, Disp: 3 Inhaler, Rfl:  3 .  Calcium Carbonate-Vitamin D (CALCIUM-VITAMIN D3 PO), D3 500- calcium 630 mg Take 1 capsule by mouth once daily, Disp: , Rfl:   Allergies  Allergen Reactions  . Codeine Swelling and Other (See Comments)    Swelling and burning of mouth (inside)  . Lactose Intolerance (Gi) Nausea And Vomiting    I personally reviewed active problem list, medication list, allergies, family history, social history, health maintenance, notes from last encounter, lab results with the patient/caregiver today.   ROS Constitutional: Negative for fever or weight change.  Respiratory: Negative for cough and shortness of breath.   Cardiovascular: Negative for chest pain or palpitations.  Gastrointestinal: Negative for abdominal pain, no bowel changes.  Musculoskeletal: Negative for gait problem or joint swelling.  Skin: Negative for rash.  Neurological: Negative for dizziness or headache.  No other specific complaints in a complete review of systems (except as listed in HPI above).  Objective  Vitals:   01/24/18 1117  BP: 128/84  Pulse: 99  Resp: 16  Temp: 98.2 F (36.8 C)  TempSrc: Oral  SpO2: 98%  Weight: 220 lb 14.4 oz (100.2 kg)  Height: _0  (1.676 m)   Body mass index is 35.65 kg/m.  Physical Exam Constitutional: Patient appears well-developed  and well-nourished. No distress.  HENT: Head: Normocephalic and atraumatic. Ears: bilateral TMs with no erythema or effusion; Nose: Nose normal. Mouth/Throat: Oropharynx is clear and moist. No oropharyngeal exudate or tonsillar swelling.  Eyes: Conjunctivae and EOM are normal. No scleral icterus.  Pupils are equal, round, and reactive to light.  Neck: Normal range of motion. Neck supple. No JVD present. No thyromegaly present.  Cardiovascular: Normal rate, regular rhythm and normal heart sounds.  No murmur heard. No BLE edema. Pulmonary/Chest: Effort normal and breath sounds normal. No respiratory distress. Abdominal: Soft. Bowel sounds are  normal, no distension. There is no tenderness. No masses. Musculoskeletal: Normal range of motion, no joint effusions. No gross deformities Neurological: Pt is alert and oriented to person, place, and time. No cranial nerve deficit. Coordination, balance, strength, speech and gait are baseline - strength is weaker on LEFT hand grip.  Skin: Skin is warm and dry. No rash noted. No erythema.  Psychiatric: Patient has a normal mood and affect. behavior is normal. Judgment and thought content normal.  No results found for this or any previous visit (from the past 72 hour(s)).  PHQ2/9: Depression screen [facility] 2/9 01/24/2018 09/25/2016 08/07/2016 07/15/2016 05/07/2016  Decreased Interest 0 0 0 0 0  Down, Depressed, Hopeless 0 0 0 0 0  PHQ - 2 Score 0 0 0 0 0  Altered sleeping 0 - - - -  Tired, decreased energy 0 - - - -  Change in appetite 0 - - - -  Feeling bad or failure about yourself  0 - - - -  Trouble concentrating 0 - - - -  Moving slowly or fidgety/restless 0 - - - -  Suicidal thoughts 0 - - - -  PHQ-9 Score 0 - - - -  Difficult doing work/chores Not difficult at all - - - -  Some recent data might be hidden   Fall Risk: Fall Risk  01/24/2018 11/11/2016 09/25/2016 08/17/2016 08/07/2016  Falls in the past year? 0 No No No No  Number falls in past yr: 0 - - - -  Injury with Fall? 0 - - - -  Risk for fall due to : - - - - -  Risk for fall due to: Comment - - - - -  Follow up - - - - -   Assessment & Plan  1. Restrictive airway disease - Ambulatory referral to Pulmonology - albuterol (PROAIR HFA) 108 (90 Base) MCG/ACT inhaler; INHALE 2 PUFFS BY MOUTH EVERY 6 HOURS AS NEEDED FOR WHEEZING OR SHORTNESS OF BREATH  Dispense: 3 Inhaler; Refill: 3 - budesonide-formoterol (SYMBICORT) 160-4.5 MCG/ACT inhaler; Inhale 2 puffs into the lungs 2 (two) times daily.  Dispense: 1 Inhaler; Refill: 3  2. OSA (obstructive sleep apnea) - Ambulatory referral to Pulmonology  3. Mild intermittent asthma without  complication - Ambulatory referral to Pulmonology - albuterol (PROAIR HFA) 108 (90 Base) MCG/ACT inhaler; INHALE 2 PUFFS BY MOUTH EVERY 6 HOURS AS NEEDED FOR WHEEZING OR SHORTNESS OF BREATH  Dispense: 3 Inhaler; Refill: 3 - budesonide-formoterol (SYMBICORT) 160-4.5 MCG/ACT inhaler; Inhale 2 puffs into the lungs 2 (two) times daily.  Dispense: 1 Inhaler; Refill: 3 - CBC w/Diff/Platelet  4. Essential hypertension - Ambulatory referral to Cardiology - losartan (COZAAR) 50 MG tablet; Take 1 tablet (50 mg total) by mouth daily.  Dispense: 30 tablet; Refill: 0 - COMPLETE METABOLIC PANEL WITH GFR; Future  5. Hypokalemia - COMPLETE METABOLIC PANEL WITH GFR; Future - potassium chloride SA (K-DUR,KLOR-CON) 20  MEQ tablet; Take 2 tablets (40 mEq total) by mouth daily.  Dispense: 60 tablet; Refill: 0  6. Mixed hyperlipidemia - Ambulatory referral to Cardiology - simvastatin (ZOCOR) 40 MG tablet; Take 1 tablet (40 mg total) by mouth daily.  Dispense: 30 tablet; Refill: 0 - Lipid panel  7. MYOCARDIAL INFARCTION, HX OF - Ambulatory referral to Cardiology - simvastatin (ZOCOR) 40 MG tablet; Take 1 tablet (40 mg total) by mouth daily.  Dispense: 30 tablet; Refill: 0 - clopidogrel (PLAVIX) 75 MG tablet; Take 1 tablet (75 mg total) by mouth daily.  Dispense: 30 tablet; Refill: 0  8. CVA, old, hemiparesis ([place]) - Ambulatory referral to Neurology - Symptoms have been stable  9. Seizure disorder ([place]) - Ambulatory referral to Neurology - COMPLETE METABOLIC PANEL WITH GFR; Future - topiramate (TOPAMAX) 200 MG tablet; Take 1 tablet (200 mg total) by mouth 2 (two) times daily.  Dispense: 60 tablet; Refill: 0  10. Headache disorder - Ambulatory referral to Neurology - topiramate (TOPAMAX) 200 MG tablet; Take 1 tablet (200 mg total) by mouth 2 (two) times daily.  Dispense: 60 tablet; Refill: 0  11. Type 2 diabetes, uncontrolled, with renal manifestation ([place]) - Ambulatory referral to Endocrinology -  COMPLETE METABOLIC PANEL WITH GFR; Future - Hemoglobin A1c - Urine Microalbumin w/creat. ratio  12. Diabetic polyneuropathy associated with type 2 diabetes mellitus ([place]) - Ambulatory referral to Endocrinology - gabapentin (NEURONTIN) 300 MG capsule; Take 1 capsule (300 mg total) by mouth 3 (three) times daily.  Dispense: 90 capsule; Refill: 0  13. Morbid obesity ([place]) - CBC w/Diff/Platelet - TSH - Ambulatory referral to Chronic Care Management Services  14. Overactive bladder - mirabegron ER (MYRBETRIQ) 50 MG TB24 tablet; Take 1 tablet (50 mg total) by mouth daily.  Dispense: 30 tablet; Refill: 0  15. Mixed urge and stress incontinence - Continue Myrbetriq  16. Gastroesophageal reflux disease without esophagitis - famotidine (PEPCID) 40 MG tablet; Take 1 tablet (40 mg total) by mouth daily.  Dispense: 30 tablet; Refill: 0 - pantoprazole (PROTONIX) 40 MG tablet; Take 1 tablet (40 mg total) by mouth daily.  Dispense: 30 tablet; Refill: 0  17. Vitamin D deficiency - VITAMIN D 25 Hydroxy (Vit-D Deficiency, Fractures) - CBC w/Diff/Platelet  18. Chronic bilateral low back pain, unspecified whether sciatica present - Ambulatory referral to Pain Clinic  19. Chronic pain disorder - Ambulatory referral to Pain Clinic  20. Vulvovaginal candidiasis - terconazole (TERAZOL 7) 0.4 % vaginal cream; Place 1 applicator vaginally at bedtime.  Dispense: 45 g; Refill: 0  21. Need for influenza vaccination - Flu Vaccine QUAD 6+ mos PF IM (Fluarix Quad PF)

## 2018-01-24 NOTE — Patient Instructions (Signed)
STOP taking Ranitidine and start taking Famotidine instead. Ranitidine has been recalled.

## 2018-01-27 LAB — MICROALBUMIN / CREATININE URINE RATIO
Creatinine, Urine: 48 mg/dL (ref 20–275)
Microalb Creat Ratio: 202 mcg/mg creat — ABNORMAL HIGH (ref <30)
Microalb, Ur: 9.7 mg/dL

## 2018-01-27 LAB — COMPLETE METABOLIC PANEL WITH GFR
AG Ratio: 1.2 (calc) (ref 1.0–2.5)
ALT: 11 U/L (ref 6–29)
AST: 10 U/L (ref 10–35)
Albumin: 4.1 g/dL (ref 3.6–5.1)
Alkaline phosphatase (APISO): 106 U/L (ref 33–130)
BUN: 9 mg/dL (ref 7–25)
CO2: 30 mmol/L (ref 20–32)
Calcium: 9.5 mg/dL (ref 8.6–10.4)
Chloride: 102 mmol/L (ref 98–110)
Creat: 0.78 mg/dL (ref 0.50–1.05)
GFR, Est African American: 101 mL/min/{1.73_m2} (ref >=60)
GFR, Est Non African American: 87 mL/min/{1.73_m2} (ref >=60)
Globulin: 3.3 g/dL (calc) (ref 1.9–3.7)
Glucose, Bld: 220 mg/dL — ABNORMAL HIGH (ref 65–99)
Potassium: 3.8 mmol/L (ref 3.5–5.3)
Sodium: 139 mmol/L (ref 135–146)
Total Bilirubin: 0.4 mg/dL (ref 0.2–1.2)
Total Protein: 7.4 g/dL (ref 6.1–8.1)

## 2018-01-27 LAB — CBC WITH DIFFERENTIAL/PLATELET
Absolute Monocytes: 453 cells/uL (ref 200–950)
Basophils Absolute: 19 cells/uL (ref 0–200)
Basophils Relative: 0.3 %
Eosinophils Absolute: 19 cells/uL (ref 15–500)
Eosinophils Relative: 0.3 %
HCT: 41.1 % (ref 35.0–45.0)
Hemoglobin: 13.4 g/dL (ref 11.7–15.5)
Lymphs Abs: 1662 cells/uL (ref 850–3900)
MCH: 28.7 pg (ref 27.0–33.0)
MCHC: 32.6 g/dL (ref 32.0–36.0)
MCV: 88 fL (ref 80.0–100.0)
MPV: 12.5 fL (ref 7.5–12.5)
Monocytes Relative: 7.3 %
Neutro Abs: 4049 cells/uL (ref 1500–7800)
Neutrophils Relative %: 65.3 %
Platelets: 252 10*3/uL (ref 140–400)
RBC: 4.67 10*6/uL (ref 3.80–5.10)
RDW: 12.4 % (ref 11.0–15.0)
Total Lymphocyte: 26.8 %
WBC: 6.2 10*3/uL (ref 3.8–10.8)

## 2018-01-27 LAB — LIPID PANEL
Cholesterol: 179 mg/dL (ref <200)
HDL: 42 mg/dL — ABNORMAL LOW (ref >50)
LDL Cholesterol (Calc): 105 mg/dL (calc) — ABNORMAL HIGH
Non-HDL Cholesterol (Calc): 137 mg/dL (calc) — ABNORMAL HIGH (ref <130)
Total CHOL/HDL Ratio: 4.3 (calc) (ref <5.0)
Triglycerides: 206 mg/dL — ABNORMAL HIGH (ref <150)

## 2018-01-27 LAB — TEST AUTHORIZATION

## 2018-01-27 LAB — VITAMIN D 25 HYDROXY (VIT D DEFICIENCY, FRACTURES): Vit D, 25-Hydroxy: 26 ng/mL — ABNORMAL LOW (ref 30–100)

## 2018-01-27 LAB — HEMOGLOBIN A1C
Hgb A1c MFr Bld: 12.6 % of total Hgb — ABNORMAL HIGH (ref <5.7)
Mean Plasma Glucose: 315 (calc)
eAG (mmol/L): 17.4 (calc)

## 2018-01-27 LAB — TSH: TSH: 0.91 mIU/L

## 2018-01-28 ENCOUNTER — Ambulatory Visit: Payer: Self-pay

## 2018-01-28 DIAGNOSIS — I252 Old myocardial infarction: Secondary | ICD-10-CM

## 2018-01-28 DIAGNOSIS — G4733 Obstructive sleep apnea (adult) (pediatric): Secondary | ICD-10-CM

## 2018-01-28 DIAGNOSIS — I69359 Hemiplegia and hemiparesis following cerebral infarction affecting unspecified side: Secondary | ICD-10-CM

## 2018-01-28 DIAGNOSIS — J984 Other disorders of lung: Secondary | ICD-10-CM

## 2018-01-28 DIAGNOSIS — IMO0002 Reserved for concepts with insufficient information to code with codable children: Secondary | ICD-10-CM

## 2018-01-28 DIAGNOSIS — E1129 Type 2 diabetes mellitus with other diabetic kidney complication: Secondary | ICD-10-CM

## 2018-01-28 DIAGNOSIS — E1165 Type 2 diabetes mellitus with hyperglycemia: Secondary | ICD-10-CM

## 2018-01-28 NOTE — Chronic Care Management (AMB) (Signed)
  Chronic Care Management   Note  01/28/2018 Name: Letti Towell MRN: [id number] DOB: [birth year]   Seda Kronberg is a 52 year old female who sees [name], [name] for primary care. Ms. Uvaldo Rising asked the CCM team to consult the patient for assistance with chronic disease management related to HTN, MI, Restrictive airway disease, DM, and chronic pain disorder and medication affordibility. Referral was placed 01/24/2018. Telephone outreach to patient today to introduce CCM services.    Was unable to reach patient via telephone today via telephone. Unfortunately voice mailbox is full and unable to leave message (unsuccessful outreach #1).  Plan: Will follow-up within 3-5  business days via telephone.     Eesha Schmaltz E. Rollene Rotunda, RN, BSN Nurse Care Coordinator [facility] Care Management  [phone]

## 2018-02-01 ENCOUNTER — Ambulatory Visit: Payer: Self-pay | Admitting: Family Medicine

## 2018-02-01 ENCOUNTER — Ambulatory Visit: Payer: Self-pay | Admitting: Pharmacist

## 2018-02-01 ENCOUNTER — Encounter: Payer: Self-pay | Admitting: Pharmacist

## 2018-02-01 ENCOUNTER — Telehealth: Payer: Self-pay | Admitting: Family Medicine

## 2018-02-01 DIAGNOSIS — E1165 Type 2 diabetes mellitus with hyperglycemia: Secondary | ICD-10-CM

## 2018-02-01 DIAGNOSIS — J984 Other disorders of lung: Secondary | ICD-10-CM

## 2018-02-01 DIAGNOSIS — IMO0002 Reserved for concepts with insufficient information to code with codable children: Secondary | ICD-10-CM

## 2018-02-01 DIAGNOSIS — E1129 Type 2 diabetes mellitus with other diabetic kidney complication: Secondary | ICD-10-CM

## 2018-02-01 DIAGNOSIS — I10 Essential (primary) hypertension: Secondary | ICD-10-CM

## 2018-02-01 NOTE — Telephone Encounter (Signed)
Copied from [place] [id number]. Topic: Quick Communication - Rx Refill/Question >> Feb 01, 2018  3:54 PM Rayann Heman wrote: Medication: Insulin Pen Needle (EASY TOUCH PEN NEEDLES) 31G X 8 MM MISC [[id number]] [facility] DELICA LANCETS 65V MISC [[id number]]   Has the patient contacted their pharmacy? No need new rx  Preferred Pharmacy (with phone number or street name):[name] [id number](Phone) [phone](Fax)   Agent: Please be advised that RX refills may take up to 3 business days. We ask that you follow-up with your pharmacy.

## 2018-02-01 NOTE — Chronic Care Management (AMB) (Signed)
  Chronic Care Management   Note  02/01/2018 Name: Lexani Corona MRN: [id number] DOB: 1965/09/14  Harley Mccartney is a 53 y.o. year old female who sees Hubbard Hartshorn, [name] for primary care. [name] asked the CCM team to consult the patient for assistance with chronic disease management related to medication assistance, obesity, and multiple chronic disease states . Referral was placed 01/24/18. Telephone outreach to patient today to introduce CCM services.   Ms. Egelston agreed that CCM services would be helpful to them.   Plan: Patient agreed to services and verbal consent obtained. I have scheduled an appointment for [name] next Thursday, January 23.   Elania Crowl was given information about Chronic Care Management services today including:  1. CCM service includes personalized support from designated clinical staff supervised by her physician, including individualized plan of care and coordination with other care providers 2. 24/7 contact phone numbers for assistance for urgent and routine care needs. 3. Service will only be billed when office clinical staff spend 20 minutes or more in a month to coordinate care. 4. Only one practitioner may furnish and bill the service in a calendar month. 5. The patient may stop CCM services at any time (effective at the end of the month) by phone call to the office staff. 6. The patient will be responsible for cost sharing (co-pay) of up to 20% of the service fee (after annual deductible is met).   Ruben Reason, PharmD Clinical Pharmacist [facility] [phone]

## 2018-02-02 DIAGNOSIS — J449 Chronic obstructive pulmonary disease, unspecified: Secondary | ICD-10-CM | POA: Diagnosis not present

## 2018-02-02 DIAGNOSIS — G4733 Obstructive sleep apnea (adult) (pediatric): Secondary | ICD-10-CM | POA: Diagnosis not present

## 2018-02-03 ENCOUNTER — Ambulatory Visit: Payer: Self-pay | Admitting: Family Medicine

## 2018-02-04 ENCOUNTER — Other Ambulatory Visit: Payer: Self-pay | Admitting: Emergency Medicine

## 2018-02-04 ENCOUNTER — Other Ambulatory Visit: Payer: Self-pay

## 2018-02-04 ENCOUNTER — Emergency Department: Payer: Medicare HMO

## 2018-02-04 ENCOUNTER — Ambulatory Visit: Payer: Self-pay

## 2018-02-04 ENCOUNTER — Emergency Department
Admission: EM | Admit: 2018-02-04 | Discharge: 2018-02-04 | Disposition: A | Discharge status: Home / Self Care | Payer: Medicare HMO | Attending: Emergency Medicine | Admitting: Emergency Medicine

## 2018-02-04 DIAGNOSIS — Z87891 Personal history of nicotine dependence: Secondary | ICD-10-CM | POA: Diagnosis not present

## 2018-02-04 DIAGNOSIS — R319 Hematuria, unspecified: Secondary | ICD-10-CM | POA: Diagnosis not present

## 2018-02-04 DIAGNOSIS — J45909 Unspecified asthma, uncomplicated: Secondary | ICD-10-CM | POA: Diagnosis not present

## 2018-02-04 DIAGNOSIS — J449 Chronic obstructive pulmonary disease, unspecified: Secondary | ICD-10-CM | POA: Diagnosis not present

## 2018-02-04 DIAGNOSIS — I10 Essential (primary) hypertension: Secondary | ICD-10-CM | POA: Insufficient documentation

## 2018-02-04 DIAGNOSIS — E1165 Type 2 diabetes mellitus with hyperglycemia: Principal | ICD-10-CM

## 2018-02-04 DIAGNOSIS — R519 Headache, unspecified: Secondary | ICD-10-CM

## 2018-02-04 DIAGNOSIS — IMO0002 Reserved for concepts with insufficient information to code with codable children: Secondary | ICD-10-CM

## 2018-02-04 DIAGNOSIS — Z8673 Personal history of transient ischemic attack (TIA), and cerebral infarction without residual deficits: Secondary | ICD-10-CM | POA: Insufficient documentation

## 2018-02-04 DIAGNOSIS — E1140 Type 2 diabetes mellitus with diabetic neuropathy, unspecified: Secondary | ICD-10-CM | POA: Diagnosis not present

## 2018-02-04 DIAGNOSIS — N390 Urinary tract infection, site not specified: Secondary | ICD-10-CM | POA: Diagnosis not present

## 2018-02-04 DIAGNOSIS — R51 Headache: Secondary | ICD-10-CM | POA: Diagnosis not present

## 2018-02-04 LAB — POCT PREGNANCY, URINE: Preg Test, Ur: NEGATIVE

## 2018-02-04 LAB — CBC
HCT: 43.3 % (ref 36.0–46.0)
Hemoglobin: 13.6 g/dL (ref 12.0–15.0)
MCH: 28.8 pg (ref 26.0–34.0)
MCHC: 31.4 g/dL (ref 30.0–36.0)
MCV: 91.5 fL (ref 80.0–100.0)
Platelets: 287 10*3/uL (ref 150–400)
RBC: 4.73 MIL/uL (ref 3.87–5.11)
RDW: 13.2 % (ref 11.5–15.5)
WBC: 6.7 10*3/uL (ref 4.0–10.5)
nRBC: 0 % (ref 0.0–0.2)

## 2018-02-04 LAB — URINALYSIS, COMPLETE (UACMP) WITH MICROSCOPIC
Bilirubin Urine: NEGATIVE
Glucose, UA: 150 mg/dL — AB
Ketones, ur: NEGATIVE mg/dL
Nitrite: POSITIVE — AB
Protein, ur: 30 mg/dL — AB
RBC / HPF: >50 RBC/hpf — ABNORMAL HIGH (ref 0–5)
Specific Gravity, Urine: 1.013 (ref 1.005–1.030)
WBC, UA: >50 WBC/hpf — ABNORMAL HIGH (ref 0–5)
pH: 6 (ref 5.0–8.0)

## 2018-02-04 LAB — BASIC METABOLIC PANEL
Anion gap: 6 (ref 5–15)
BUN: 11 mg/dL (ref 6–20)
CO2: 25 mmol/L (ref 22–32)
Calcium: 9.7 mg/dL (ref 8.9–10.3)
Chloride: 109 mmol/L (ref 98–111)
Creatinine, Ser: 0.85 mg/dL (ref 0.44–1.00)
GFR calc Af Amer: >60 mL/min (ref >60)
GFR calc non Af Amer: >60 mL/min (ref >60)
Glucose, Bld: 237 mg/dL — ABNORMAL HIGH (ref 70–99)
Potassium: 3.5 mmol/L (ref 3.5–5.1)
Sodium: 140 mmol/L (ref 135–145)

## 2018-02-04 LAB — GLUCOSE, CAPILLARY: Glucose-Capillary: 174 mg/dL — ABNORMAL HIGH (ref 70–99)

## 2018-02-04 MED ORDER — CEPHALEXIN 500 MG PO CAPS: 500.0000 mg | ORAL_CAPSULE | Freq: Four times a day (QID) | ORAL | 0 refills | Status: DC

## 2018-02-04 MED ORDER — SODIUM CHLORIDE 0.9 % IV SOLN
Freq: Once | INTRAVENOUS | Status: AC
  Administered 2018-02-04: 18:00:00 via INTRAVENOUS

## 2018-02-04 MED ORDER — SODIUM CHLORIDE 0.9 % IV SOLN
Freq: Once | INTRAVENOUS | Status: AC
  Administered 2018-02-04: 19:00:00 via INTRAVENOUS

## 2018-02-04 MED ORDER — INSULIN PEN NEEDLE 31G X 8 MM MISC: 3 refills | Status: DC

## 2018-02-04 MED ORDER — CEPHALEXIN 500 MG PO CAPS: 500.0000 mg | ORAL_CAPSULE | Freq: Four times a day (QID) | ORAL | 0 refills | Status: AC

## 2018-02-04 MED ORDER — METOCLOPRAMIDE HCL 5 MG/ML IJ SOLN
10.0000 mg | Freq: Once | INTRAMUSCULAR | Status: AC
  Administered 2018-02-04: 10 mg via INTRAVENOUS
  Filled 2018-02-04: qty 2

## 2018-02-04 MED ORDER — KETOROLAC TROMETHAMINE 30 MG/ML IJ SOLN
30.0000 mg | Freq: Once | INTRAMUSCULAR | Status: AC
  Administered 2018-02-04: 30 mg via INTRAVENOUS
  Filled 2018-02-04: qty 1

## 2018-02-04 MED ORDER — BUTALBITAL-APAP-CAFFEINE 50-325-40 MG PO TABS: 1.0000 | ORAL_TABLET | Freq: Four times a day (QID) | ORAL | 0 refills | Status: AC | PRN

## 2018-02-04 MED ORDER — SODIUM CHLORIDE 0.9 % IV SOLN
1.0000 g | Freq: Once | INTRAVENOUS | Status: AC
  Administered 2018-02-04: 1 g via INTRAVENOUS
  Filled 2018-02-04: qty 10

## 2018-02-04 NOTE — Telephone Encounter (Signed)
Sending order to [facility]

## 2018-02-04 NOTE — ED Provider Notes (Signed)
[facility] Emergency Department Provider Note       Time seen: ----------------------------------------- 5:45 PM on 02/04/2018 -----------------------------------------   I have reviewed the triage vital signs and the nursing notes.  HISTORY   Chief Complaint Dizziness and Headache   HPI [name] is a 53 y.o. female with a history of anxiety, depression, asthma, COPD, diabetes, GERD who presents to the ED for headache.  Patient reports headache that started 2 days ago.  Patient reports last night she became dizzy.  Having a frontal headache.  Previously she had a VP shunt but it was removed years ago.  She does have a history of stroke as well, denies any neurologic deficit.  Past Medical History:  Diagnosis Date  . Anxiety   . Anxiety and depression 09/13/2006   Qualifier: Diagnosis of  By: Hassell Done [name], Tori Milks    . Arthritis    joint pain   . Asthma   . COPD (chronic obstructive pulmonary disease) (HCC)    chronic bronchitis   . CVA 11/14/2007   Qualifier: Diagnosis of  By: Hassell Done [name], Tori Milks    . Depression   . Diabetes mellitus    since age 25; type 2 IDDM  . Diabetic neuropathy, painful ([place])    FEET AND HANDS  . Dilated aortic root ([place])    82m by echo 11/2015  . Dizziness    secondary to diabetes and hypertension   . Dysrhythmia    skipped beats  . Epilepsy idiopathic petit mal ([place]    last seizure 2012;controlled w/ topomax  . GERD (gastroesophageal reflux disease)   . Glaucoma    NOT ON ANY EYE DROPS   . Headache(784.0)    migraines   . Heart murmur    born with   . Hx of blood clots    hematomas removed from left side of brain from [id number]   . Hyperlipidemia   . Hypertension   . Legally blind    left eye   . MIGRAINE HEADACHE 09/14/2006   Qualifier: Diagnosis of  By: MarHassell DoneP, NykTori Milks . Myocardial infarction (HCCStone  hi of x 2  last one 2003   . Nephrolithiasis    frequent urination ,  urination at nite  PT SEEN IN ER 09/22/11 FOR BACK AND RT SIDED PAIN--HAS KNOWN STONE RT URETER AND UA IN ER SHOWED UTI  . Neuropathy    due to diabetes   . Pain 09/23/11   LOWER BACK AND RIGHT SIDE--PT HAS RIGHT URETERAL STONE  . Pneumonia    hx of 2009  . Pyelonephritis   . Seizures ([address] . Sleep ap[facility]    [facility] ; mild  . Stress incontinence   . [facility]  last 2003  RESIDUAL LEFT LEG WEAKNESS--NO OTHER RESIDUAL PROBLEMS    Patient Active Problem List   Diagnosis Date Noted  . Vitamin D deficiency 01/24/2018  . Vulvovaginal candidiasis 01/24/2018  . Hypokalemia 06/08/2017  . Overactive bladder 05/18/2017  . Asthma 06/22/2016  . Dilated aortic root ([place] . Sacroiliac dysfunction 10/09/2014  . Chronic low back pain 09/03/2014  . CVA, old, hemiparesis (HCCMerrick8/15/2016  . OSA (obstructive sleep apnea) 05/15/2014  . Restrictive airway disease 05/15/2014  . Colitis 02/22/2014  . Type 2 diabetes, uncontrolled, with renal manifestation (HCCNettie1/03/2014  . Chronic pain disorder 01/21/2014  . Headache disorder 09/07/2013  .  Diabetic neuropathy ([place]) 05/23/2013  . Mixed urge and stress incontinence 08/25/2011  . Ureteral stricture, right 08/25/2011  . Pyelonephritis 08/22/2011  . Nephrolithiasis 08/22/2011  . Incisional hernia 05/10/2011  . Morbid obesity ([place]) 05/10/2011  . Tachycardia 05/08/2011  . BLINDNESS, [place] DEFINITION 09/14/2006  . Hyperlipemia 09/13/2006  . Essential hypertension 09/13/2006  . MYOCARDIAL INFARCTION, HX OF 09/13/2006  . GERD 09/13/2006  . Seizure disorder ([place]) 09/13/2006    Past Surgical History:  Procedure Laterality Date  . BRAIN HEMATOMA EVACUATION     five procedures total, first procedure when 29 months old, last at 53 years of age.  . CYSTOSCOPY W/ RETROGRADES  09/24/2011   Procedure: CYSTOSCOPY WITH RETROGRADE PYELOGRAM;  Surgeon: Malka So, MD;  Location: [facility];  Service: Urology;   Laterality: Right;  . CYSTOSCOPY WITH URETEROSCOPY  09/24/2011   Procedure: CYSTOSCOPY WITH URETEROSCOPY;  Surgeon: Malka So, MD;  Location: [facility];  Service: Urology;  Laterality: Right;  Balloon dilation right ureter   . CYSTOSCOPY/RETROGRADE/URETEROSCOPY  08/24/2011   Procedure: CYSTOSCOPY/RETROGRADE/URETEROSCOPY;  Surgeon: Molli Hazard, MD;  Location: [facility];  Service: Urology;  Laterality: Right;  Cysto, Right retrograde Pyelogram, right stent placement.   Fatima Blank HERNIA REPAIR  05/05/2011   Procedure: LAPAROSCOPIC INCISIONAL HERNIA;  Surgeon: Rolm Bookbinder, MD;  Location: [place];  Service: General;  Laterality: N/A;  . kidney stone removal    . MASS EXCISION  05/05/2011   Procedure: EXCISION MASS;  Surgeon: Rolm Bookbinder, MD;  Location: [place];  Service: General;  Laterality: Right;  . PCNL    . RETINAL DETACHMENT SURGERY Left 1990  . SHUNT REMOVAL     shunt inserted at age 15 removed at age 69   . VAGINAL HYSTERECTOMY  1996    Allergies Codeine and Lactose intolerance (gi)  Social History Social History   Tobacco Use  . Smoking status: Former Smoker    Packs/day: 1.00    Years: 15.00    Pack years: 15.00    Types: Cigarettes    Last attempt to quit: 01/20/1987    Years since quitting: 31.0  . Smokeless tobacco: Never Used  . Tobacco comment: quit more than 20years  Substance Use Topics  . Alcohol use: Yes    Alcohol/week: 0.0 standard drinks    Comment: 1 glass weekly  . Drug use: No    Comment: hx of marijuana use no longer uses    Review of Systems Constitutional: Negative for fever. Cardiovascular: Negative for chest pain. Respiratory: Negative for shortness of breath. Musculoskeletal: Negative for back pain. Skin: Negative for rash. Neurological: Positive for headache  All systems negative/normal/unremarkable except as stated in the HPI  ____________________________________________   PHYSICAL EXAM:  VITAL SIGNS: ED Triage Vitals  Enc  Vitals Group     BP 02/04/18 1700 (!) 168/101     Pulse Rate 02/04/18 1700 84     Resp 02/04/18 1700 16     Temp 02/04/18 1700 98.4 F (36.9 C)     Temp Source 02/04/18 1700 Oral     SpO2 02/04/18 1700 100 %     Weight 02/04/18 1702 220 lb (99.8 kg)     Height 02/04/18 1702 5' 6"  (1.676 m)     Head Circumference --      Peak Flow --      Pain Score 02/04/18 1709 9     Pain Loc --      Pain Edu? --  Excl. in [place]? --    Constitutional: Alert and oriented. Well appearing and in no distress. Eyes: Conjunctivae are normal. Normal extraocular movements. ENT      Head: Normocephalic and atraumatic.      Nose: No congestion/rhinnorhea.      Mouth/Throat: Mucous membranes are moist.      Neck: No stridor. Cardiovascular: Normal rate, regular rhythm. No murmurs, rubs, or gallops. Respiratory: Normal respiratory effort without tachypnea nor retractions. Breath sounds are clear and equal bilaterally. No wheezes/rales/rhonchi. Gastrointestinal: Soft and nontender. Normal bowel sounds Musculoskeletal: Nontender with normal range of motion in extremities. No lower extremity tenderness nor edema. Neurologic:  Normal speech and language. No gross focal neurologic deficits are appreciated.  Skin:  Skin is warm, dry and intact. No rash noted. Psychiatric: Mood and affect are normal. Speech and behavior are normal.  ____________________________________________  EKG: Interpreted by me.  Sinus rhythm rate 79 bpm, left axis deviation, possible anterior infarct age-indeterminate, normal QT  ____________________________________________  ED COURSE:  As part of my medical decision making, I reviewed the following data within the [place] History obtained from family if available, nursing notes, old chart and ekg, as well as notes from prior ED visits. Patient presented for headache, we will assess with labs and imaging as indicated at this time.    Procedures ____________________________________________   LABS (pertinent positives/negatives)  Labs Reviewed  BASIC METABOLIC PANEL - Abnormal; Notable for the following components:      Result Value   Glucose, Bld 237 (*)    All other components within normal limits  URINALYSIS, COMPLETE (UACMP) WITH MICROSCOPIC - Abnormal; Notable for the following components:   Color, Urine YELLOW (*)    APPearance CLOUDY (*)    Glucose, UA 150 (*)    Hgb urine dipstick LARGE (*)    Protein, ur 30 (*)    Nitrite POSITIVE (*)    Leukocytes, UA MODERATE (*)    RBC / HPF >50 (*)    WBC, UA >50 (*)    Bacteria, UA MANY (*)    All other components within normal limits  GLUCOSE, CAPILLARY - Abnormal; Notable for the following components:   Glucose-Capillary 174 (*)    All other components within normal limits  CBC  CBG MONITORING, ED  POC URINE PREG, ED  POCT PREGNANCY, URINE    RADIOLOGY Images were viewed by me  CT head IMPRESSION: No acute intracranial pathology ._________________________________________   DIFFERENTIAL DIAGNOSIS   Migraine, tension headache, hypertension, dehydration, electrolyte abnormality  FINAL ASSESSMENT AND PLAN  Headache, UTI   Plan: The patient had presented for headache and dizziness. Patient's labs are reassuring with exception of mild hyperglycemia. Patient's imaging not reveal any acute process.  Headache was improving, she also had a urinary tract infection.  She will be placed on antibiotics and is cleared for outpatient follow-up.   Laurence Aly, MD    Note: This note was generated in part or whole with voice recognition software. Voice recognition is usually quite accurate but there are transcription errors that can and very often do occur. I apologize for any typographical errors that were not detected and corrected.     Earleen Newport, MD 02/04/18 1924

## 2018-02-04 NOTE — Telephone Encounter (Signed)
Patient stated she has no refills on file

## 2018-02-04 NOTE — Telephone Encounter (Signed)
Spoke to patient and she stated she will be going to ER when her daughter comes to bring her. Informed her of Urgent Care in [place]  Open til 8pm and she stated that was to far for her to go.

## 2018-02-04 NOTE — Telephone Encounter (Signed)
Patient called in with c/o "blood in urine." She says "I've been urinating more frequently all morning and I've urinated twice pink tinged urine. I had this to happen in the past and had stents placed. I have no pain with urination, but left lower back pain." According to protocol, see PCP within 24 hours, no availability with PCP. Patient says she has a ride for after 3 pm appointments. No availability with any provider at the practice. Patient advised to go to UC or ED, she says her daughter will take her to the ED. Care advice given, patient verbalized understanding.  Reason for Disposition . Side (flank) or back pain present  Answer Assessment - Initial Assessment Questions 1. COLOR of URINE: "Describe the color of the urine."  (e.g., tea-colored, pink, red, blood clots, bloody)     Pink-tinged 2. ONSET: "When did the bleeding start?"      Earlier this morning 3. EPISODES: "How many times has there been blood in the urine?" or "How many times today?"     Twice 4. PAIN with URINATION: "Is there any pain with passing your urine?" If so, ask: "How bad is the pain?"  (Scale 1-10; or mild, moderate, severe)    - MILD - complains slightly about urination hurting    - MODERATE - interferes with normal activities      - SEVERE - excruciating, unwilling or unable to urinate because of the pain      No 5. FEVER: "Do you have a fever?" If so, ask: "What is your temperature, how was it measured, and when did it start?"     No 6. ASSOCIATED SYMPTOMS: "Are you passing urine more frequently than usual?"    Yes, all night 7. OTHER SYMPTOMS: "Do you have any other symptoms?" (e.g., back/flank pain, abdominal pain, vomiting)     Left lower flank 8. PREGNANCY: "Is there any chance you are pregnant?" "When was your last menstrual period?"     No  Protocols used: URINE - BLOOD IN-A-AH

## 2018-02-04 NOTE — ED Triage Notes (Signed)
Pt states that her head started hurting two days ago, states that last night she became dizzy, pt states that she just doesn't feel good and thinks it may be related to her bp

## 2018-02-10 ENCOUNTER — Ambulatory Visit (INDEPENDENT_AMBULATORY_CARE_PROVIDER_SITE_OTHER): Payer: Medicare HMO | Admitting: Pharmacist

## 2018-02-10 DIAGNOSIS — IMO0002 Reserved for concepts with insufficient information to code with codable children: Secondary | ICD-10-CM

## 2018-02-10 DIAGNOSIS — E1129 Type 2 diabetes mellitus with other diabetic kidney complication: Secondary | ICD-10-CM | POA: Diagnosis not present

## 2018-02-10 DIAGNOSIS — J4520 Mild intermittent asthma, uncomplicated: Secondary | ICD-10-CM | POA: Diagnosis not present

## 2018-02-10 DIAGNOSIS — E1165 Type 2 diabetes mellitus with hyperglycemia: Secondary | ICD-10-CM | POA: Diagnosis not present

## 2018-02-10 DIAGNOSIS — I10 Essential (primary) hypertension: Secondary | ICD-10-CM

## 2018-02-10 DIAGNOSIS — E7800 Pure hypercholesterolemia, unspecified: Secondary | ICD-10-CM | POA: Diagnosis not present

## 2018-02-10 DIAGNOSIS — J984 Other disorders of lung: Secondary | ICD-10-CM

## 2018-02-10 DIAGNOSIS — E782 Mixed hyperlipidemia: Secondary | ICD-10-CM

## 2018-02-10 NOTE — Patient Instructions (Addendum)
1.  Please begin using [facility] for your maintenance or "every day" medications and [facility] (which will deliver to your home) for your "need to take right now" medications.   [facility] telephone number: [phone] [facility] telephone number: [phone]  2. Complete application to [place] transportation (transportation for [facility]) and return to [facility].   3. Start taking your Symbicort inhaler in the morning AND in the evening. Hopefully this will decrease the number of days you need your rescue inhaler.   4. Monitor your mail for the certificate coming from the Register of Deeds for [facility].   5. We will tackle your diabetes at your next appointment! Bring your meter and all medications to our next appointment, regardless if your meter is not working anymore!   Your follow up appointment (in the office) is Tuesday, February 4 at 9:00 am   Goals Addressed            This Visit's Progress   . "I dont drive and my daughter works every day" (pt-stated)       Current Barriers:  . Legally blind and unable to receive drivers license  Nurse Case Manager Clinical Goal(s): Over the next 14 days, patient will verbalize complete and return [place] application to [employer] ([employer])  Interventions:  . Needs assessed by CCM Team (transportation) . Provided LINK application   Patient Self Care Activities:  . Engage with C3 team and utilize resources provided  *initial goal documentation   Pharmacist Clinical Goal(s): Over the next 14 days, patient will set up pharmacy accounts with [facility].    Patient Self Care Activities:  . Call [facility] to set up your accounts . Take all medications as prescribed  Interventions and Plan: . CCM pharmacist will transfer every day medications to [facility] and  "emergency" or one time medications at [facility] (which will deliver to your home)  *initial goal documentation     . "I need help getting my husbands death certificate" (pt-stated)       Current Barriers:  . transportation . financial   Nurse Case Manager Clinical Goal(s): Over the next 14 days, patient will verbalize receipt of husbands death certificate  Interventions:  . CCM RN CM spoke with Altha Harm at [facility] to assist with process . CCM RN CM provided letter of need to patient for her to mail    Patient Self Care Activities:  . Patient will mail letter to [place] provided today  *initial goal documentation        Thank you allowing the Chronic Care Management Team to be a part of your care!   Please call a member of the CCM (Chronic Care Management) Team with any questions or case management needs:   Vanetta Mulders, BSN Nurse Care Coordinator  [phone]  Ruben Reason, PharmD  Clinical Pharmacist  [phone]

## 2018-02-10 NOTE — Chronic Care Management (AMB) (Signed)
Chronic Care Management   Note  02/10/2018 Name: [name] MRN: [id number] DOB: 09/16/1965  Chief Complaint  Patient presents with  . Chronic Care Management    initial face to face    Subjective:   Does the patient  feel that his/her medications are working for him/her?  yes  Has the patient been experiencing any side effects to the medications prescribed?  no  Does the patient measure his/her own blood glucose at home?  no - experiencing an issue with her meter  Does the patient measure his/her own blood pressure at home? no   Does the patient have any problems obtaining medications due to transportation or finances?   yes  Understanding of regimen: poor Understanding of indications: poor Potential of compliance: fair  Objective: Lab Results  Component Value Date   CREATININE 0.85 02/04/2018   CREATININE 0.78 01/24/2018   CREATININE 0.89 05/18/2017    Lab Results  Component Value Date   HGBA1C 12.6 (H) 01/24/2018    Lipid Panel     Component Value Date/Time   CHOL 179 01/24/2018 1225   CHOL 167 05/08/2015 1106   TRIG 206 (H) 01/24/2018 1225   HDL 42 (L) 01/24/2018 1225   HDL 48 05/08/2015 1106   CHOLHDL 4.3 01/24/2018 1225   VLDL 25 07/30/2016 1408   LDLCALC 105 (H) 01/24/2018 1225    BP Readings from Last 3 Encounters:  02/04/18 (!) 165/99  01/24/18 128/84  12/03/17 (!) 160/99    Allergies  Allergen Reactions  . Codeine Swelling and Other (See Comments)    Swelling and burning of mouth (inside)  . Lactose Intolerance (Gi) Nausea And Vomiting    Medications Reviewed Today    Reviewed by Cathi Roan, [facility] (Pharmacist) on 02/10/18 at [phone]  Med List Status: <None>  Medication Order Taking? Sig Documenting Provider Last Dose Status Informant  albuterol (PROAIR HFA) 108 (90 Base) MCG/ACT inhaler [id number] Yes INHALE 2 PUFFS BY MOUTH EVERY 6 HOURS AS NEEDED FOR WHEEZING OR SHORTNESS OF BREATH Hubbard Hartshorn, [name] Taking Active     Med Note Kary Kos, Garnetta Buddy Feb 10, 2018  9:27 AM) Uses once a day  [place] [id number] Yes Medication Name: Muscle and Joint Vanishing Scent Gel- Apply as needed to skin [provider] Taking Active   baclofen (LIORESAL) 10 MG tablet [id number] Yes Take 1 tablet (10 mg total) by mouth 3 (three) times daily. Martinique, Betty G, MD Taking Active   Blood Glucose Monitoring Suppl [facility] VERIO) w/Device Drucie Opitz [id number] No 1 kit by Does not apply route 3 (three) times daily. Dx: E11.29  Patient not taking:  Reported on 02/10/2018   Gayland Curry, DO Not Taking Active   budesonide-formoterol [facility]) 160-4.5 MCG/ACT inhaler [id number] Yes Inhale 2 puffs into the lungs 2 (two) times daily. Hubbard Hartshorn, [name] Taking Active   butalbital-acetaminophen-caffeine (FIORICET, ESGIC) [phone] MG tablet [id number] No Take 1-2 tablets by mouth every 6 (six) hours as needed.  Patient not taking:  Reported on 02/10/2018   Earleen Newport, MD Not Taking Active            Med Note Kary Kos, Garnetta Buddy Feb 10, 2018  9:22 AM) Has not picked up from pharmacy  Calcium Carbonate-Vitamin D (CALCIUM-VITAMIN D3 PO) [id number] Yes D3 500- calcium 630 mg Take 1 capsule by mouth once daily [provider] Taking Active   cephALEXin (KEFLEX) 500 MG capsule [id number] No  Take 1 capsule (500 mg total) by mouth 4 (four) times daily for 10 days.  Patient not taking:  Reported on 02/10/2018   Earleen Newport, MD Not Taking Active            Med Note Kary Kos, Garnetta Buddy Feb 10, 2018  9:06 AM) Has not started abx  clopidogrel (PLAVIX) 75 MG tablet [id number] Yes Take 1 tablet (75 mg total) by mouth daily. Hubbard Hartshorn, [name] Taking Active   cycloSPORINE (RESTASIS) 0.05 % ophthalmic emulsion [id number] Yes Place 2 drops into both eyes 2 (two) times daily. [provider] Taking Active   famotidine (PEPCID) 40 MG tablet [id number] Yes Take 1 tablet (40 mg total) by  mouth daily. Hubbard Hartshorn, [name] Taking Active   fluorometholone (FML) 0.1 % ophthalmic suspension [id number] Yes Place 2 drops into both eyes 2 (two) times daily. [provider] Taking Active   fluticasone (FLONASE) 50 MCG/ACT nasal spray [id number] Yes Place 2 sprays into both nostrils daily. Lauree Chandler, NP Taking Active   gabapentin (NEURONTIN) 300 MG capsule [id number] Yes Take 1 capsule (300 mg total) by mouth 3 (three) times daily. Hubbard Hartshorn, [name] Taking Active   glucose blood [facility] VERIO) test strip [id number]  1 each by Other route 3 (three) times daily. E11.29 Lauree Chandler, NP  Active            Med Note Kary Kos, Garnetta Buddy Feb 10, 2018  9:41 AM) "plugs into wall to work but we have lost the cord"  Insulin Glargine (TOUJEO SOLOSTAR) 300 UNIT/ML SOPN [id number] Yes Inject 100 Units into the skin daily. Martinique, Betty G, MD Taking Active   insulin lispro [facility]) 100 UNIT/ML KiwkPen [id number]  Inject 10 units with each meal plus correction (ISF 25, target 150)  Patient taking differently:  Inject 20 Units into the skin 3 (three) times daily. Inject 20 units with each meal plus correction (ISF 25, target 150)   [place], Malka So, MD  Active            Med Note Kary Kos, Blair Heys   Thu Feb 10, 2018  9:39 AM) 25 un per meal (prior to eating) If BG is >150 mg/dL, takes 5 additional units  Insulin Pen Needle (EASY TOUCH PEN NEEDLES) 31G X 8 MM MISC [id number] Yes Use as directed in giving insulin. Dx: E11.29 Hubbard Hartshorn, [name] Taking Active   losartan (COZAAR) 50 MG tablet [id number] Yes Take 1 tablet (50 mg total) by mouth daily. Hubbard Hartshorn, [name] Taking Active   methocarbamol (ROBAXIN) 500 MG tablet [id number] No Take 1 tablet (500 mg total) by mouth 4 (four) times daily.  Patient not taking:  Reported on 02/10/2018   CuthriellCharline Bills, [name] Not Taking Active   mirabegron ER (MYRBETRIQ) 50 MG TB24 tablet [id number] Yes Take 1 tablet (50 mg total) by mouth  daily. Hubbard Hartshorn, [name] Taking Active            Med Note Kary Kos, Garnetta Buddy Feb 10, 2018  9:35 AM) "doesn't work"  ondansetron (ZOFRAN-ODT) 4 MG disintegrating tablet [id number] No TAKE 1 TABLET BY MOUTH EVERY 8 HOURS AS NEEDED FOR NAUSEA OR VOMITING  Patient not taking:  Reported on 02/10/2018   Lauree Chandler, NP Not Taking Active   [facility] LANCETS 70J MISC [id number] Yes Use to check sugar three times daily as directed.Dx: E11.29; E11.40 Gildardo Cranker,  DO Taking Active   pantoprazole (PROTONIX) 40 MG tablet [id number] Yes Take 1 tablet (40 mg total) by mouth daily. Hubbard Hartshorn, [name] Taking Active   polyethylene glycol [facility] / GLYCOLAX) packet [id number] Yes Take 17 g by mouth daily as needed for mild constipation. Lauree Chandler, NP Taking Active            Med Note Kary Kos, Garnetta Buddy Feb 10, 2018  9:37 AM) daily  potassium chloride SA (K-DUR,KLOR-CON) 20 MEQ tablet [id number] Yes Take 2 tablets (40 mEq total) by mouth daily. Hubbard Hartshorn, [name] Taking Active   simvastatin (ZOCOR) 40 MG tablet [id number] Yes Take 1 tablet (40 mg total) by mouth daily. Hubbard Hartshorn, [name] Taking Active            Med Note Kary Kos, Garnetta Buddy Feb 10, 2018  9:37 AM) AM  terconazole (TERAZOL 7) 0.4 % vaginal cream [id number] Yes Place 1 applicator vaginally at bedtime. Hubbard Hartshorn, [name] Taking Active   topiramate (TOPAMAX) 200 MG tablet [id number] Yes Take 1 tablet (200 mg total) by mouth 2 (two) times daily. Hubbard Hartshorn, [name] Taking Active   TRULICITY 5.36 UY/4.0HK [place] [facility] [id number] Yes INJECT THE CONTENTS OF 1  PEN SUBCUTANEOUSLY WEEKLY  AS DIRECTED Elayne Snare, MD Taking Active   Med List Note Leatrice Jewels, CPhT 11/27/14 1159): CPAP at night             Assessment:   Total Number of meds:  __________ (polypharmacy > 10 meds)  Indications for all medications: []  Yes       []  No  Adherence Review  []  Excellent (no doses missed/week)     []  Good (no more than 1  dose missed/week)     []  Partial (2-3 doses missed/week)     []  Poor (>3 doses missed/week)  Intervention  YES NO  Explanation  Needs additional therapy      Medication requires monitoring []  []    Preventative therapy   []  []    Possible untreated condition   [x]  []  Has not picked up Keflex for UTI from ED visit 02/04/18  Synergistic therapy   []  []     Adherence      Cannot afford medication [x]  []  Per patient report   Cannot self-administer medication appropriately []  []     Does not understand directions [x]  []     Prefers not to take []  []     Product unavailable []  []     Forgets to take []  []     Other pertinent pharmacist  counseling    Low Income Subsidy application, referral to mail order pharmacy and local pharmacy that delivers due to transportation barrier   Goals Addressed            This Visit's Progress   . "I dont drive and my daughter works every day" (pt-stated)       Current Barriers:  . Legally blind and unable to receive drivers license  Nurse Case Manager Clinical Goal(s): Over the next 14 days, patient will verbalize complete and return [place] application to [employer] (Designer[employer]ellery)  Interventions:  . Needs assessed by CCM Team (transportation) . Provided LINK application   Patient Self Care Activities:  . Engage with C3 team and utilize resources provided  *initial goal documentation   Pharmacist Clinical Goal(s): Over the next 14 days, patient will set up pharmacy accounts with [place].  Patient Self Care Activities:  . Call [facility] to set up your accounts . Take all medications as prescribed  Interventions and Plan: . CCM pharmacist will transfer every day medications to [place] and "emergency" or one time medications at [facility] (which will deliver to your home)  *initial goal documentation     . "I  need help getting my husbands death certificate" (pt-stated)       Current Barriers:  . transportation . financial   Nurse Case Manager Clinical Goal(s): Over the next 14 days, patient will verbalize receipt of husbands death certificate  Interventions:  . CCM RN CM spoke with Altha Harm at [place] to assist with process . CCM RN CM provided letter of need to patient for her to mail    Patient Self Care Activities:  . Patient will mail letter to [employer] provided today  *initial goal documentation        Plan: Recommendations discussed with providers - Send new prescriptions to [place] pharmacy  Recommendations discussed with patient - [facility] [place] to set up accounts  Follow up telephone call Friday for LIS application  Follow up in 2 weeks in office for disease state management (Diabetes)  Ruben Reason, PharmD Clinical Pharmacist [place] [phone]

## 2018-02-10 NOTE — Chronic Care Management (AMB) (Signed)
Chronic Care Management   Initial Visit Note  02/10/2018 Name: [name] MRN: [id number] DOB: April 20, 1965  Referred by: Hubbard Hartshorn, [name] Reason for referral : Chronic Care Management (initial face to face)   Subjective: "I need my husbands death certificate to get medicaid again but I don't drive" " I just   Objective:  Assessment: [name] is a 53 y.o. year old female who sees Hubbard Hartshorn, [name] for primary care. [name] asked the CCM team to consult the patient for assistance with chronic disease management related to medication assistance, obesity, and multiple chronic disease states . Referral was placed 01/24/18. Today [name] met with the CCM Team to discuss healthcare needs and goals. Today the CCM Team focused on social determinants of health and medication management/assistance. CCM RN CM will bring patient back to office to discuss chronic disease management.  Goals Addressed    . "I dont drive and my daughter works every day" (pt-stated)       Current Barriers:  . Legally blind and unable to receive drivers license  Nurse Case Manager Clinical Goal(s): Over the next 14 days, patient will verbalize complete and return [place] application to [employer] (Designer, jewellery)  Interventions:  . Needs assessed by CCM Team (transportation) . Provided LINK application   Patient Self Care Activities:  . Engage with C3 team and utilize resources provided  *initial goal documentation   Pharmacist Clinical Goal(s): Over the next 14 days, patient will set up pharmacy accounts with [place].    Patient Self Care Activities:  . Call [place] to set up your accounts . Take all medications as prescribed  Interventions and Plan: . CCM pharmacist will transfer every day medications to [place] and "emergency" or one time medications at [facility] (which will deliver to your home)  *initial goal documentation     . "I need help getting my husbands death certificate" (pt-stated)       Current Barriers:  . transportation . financial   Nurse Case Manager Clinical Goal(s): Over the next 14 days, patient will verbalize receipt of husbands death certificate  Interventions:  . CCM RN CM spoke with [name] at [facility] to assist with process . CCM RN CM provided letter of need to patient for her to mail    Patient Self Care Activities:  . Patient will mail letter to [place] provided today  *initial goal documentation       Plan: CCM Team will bring patient back in 2 weeks for disease management education Feb 22, 2018   [name] was given information about Chronic Care Management services today including:  1. CCM service includes personalized support from designated clinical staff supervised by her physician, including individualized plan of care and coordination with other care providers 2. 24/7 contact phone numbers for assistance for urgent and routine care needs. 3. Service will only be billed when office clinical staff spend 20 minutes or more in a month to coordinate care. 4. Only one practitioner may furnish and bill the service in a calendar month. 5. The patient may stop CCM services at any time (effective at the end of the month) by phone call to the office staff. 6. The patient will be responsible for cost sharing (co-pay) of up to 20% of the service fee (after annual deductible is met).  Patient agreed to services and verbal consent obtained.    [name] E. Rollene Rotunda, RN, BSN Nurse Care Coordinator [facility] Care Management  [phone]

## 2018-02-11 ENCOUNTER — Ambulatory Visit: Payer: Self-pay | Admitting: Pharmacist

## 2018-02-11 DIAGNOSIS — I10 Essential (primary) hypertension: Secondary | ICD-10-CM

## 2018-02-11 DIAGNOSIS — J984 Other disorders of lung: Secondary | ICD-10-CM

## 2018-02-11 DIAGNOSIS — IMO0002 Reserved for concepts with insufficient information to code with codable children: Secondary | ICD-10-CM

## 2018-02-11 DIAGNOSIS — E1129 Type 2 diabetes mellitus with other diabetic kidney complication: Secondary | ICD-10-CM | POA: Diagnosis not present

## 2018-02-11 DIAGNOSIS — E7800 Pure hypercholesterolemia, unspecified: Secondary | ICD-10-CM | POA: Diagnosis not present

## 2018-02-11 DIAGNOSIS — E1165 Type 2 diabetes mellitus with hyperglycemia: Secondary | ICD-10-CM | POA: Diagnosis not present

## 2018-02-11 DIAGNOSIS — J4520 Mild intermittent asthma, uncomplicated: Secondary | ICD-10-CM | POA: Diagnosis not present

## 2018-02-11 DIAGNOSIS — E782 Mixed hyperlipidemia: Secondary | ICD-10-CM | POA: Diagnosis not present

## 2018-02-11 NOTE — Patient Instructions (Signed)
Monitor your mail carefully for communication from the [place].    Thank you allowing the Chronic Care Management Team to be a part of your care!   Please call a member of the CCM (Chronic Care Management) Team with any questions or case management needs:   Vanetta Mulders, BSN Nurse Care Coordinator  [phone]  Ruben Reason, PharmD  Clinical Pharmacist  [phone]  The patient verbalized understanding of instructions provided today and declined a print copy of patient instruction materials.

## 2018-02-11 NOTE — Chronic Care Management (AMB) (Signed)
Chronic Care Management   Note  02/11/2018 Name: [name] MRN: [id number] DOB: Jul 04, 1965   53 y.o. year old female referred to [facility] pharmacist for medication assistance.   Referral source:  [name], [name] Current insurance: [facility] Currently receiving Extra Help:  []  Yes [x]  No []  Unknown  Subjective: "I got Extra Help last year but I need to apply again"  Objective: Medications Reviewed Today    Reviewed by Cathi Roan, [facility] (Pharmacist) on 02/10/18 at [phone]  Med List Status: <None>  Medication Order Taking? Sig Documenting Provider Last Dose Status Informant  albuterol (PROAIR HFA) 108 (90 Base) MCG/ACT inhaler [id number] Yes INHALE 2 PUFFS BY MOUTH EVERY 6 HOURS AS NEEDED FOR WHEEZING OR SHORTNESS OF BREATH Hubbard Hartshorn, [name] Taking Active            Med Note Kary Kos, Garnetta Buddy Feb 10, 2018  9:27 AM) Uses once a day  [place] [id number] Yes Medication Name: Muscle and Joint Vanishing Scent Gel- Apply as needed to skin [provider] Taking Active   baclofen (LIORESAL) 10 MG tablet [id number] Yes Take 1 tablet (10 mg total) by mouth 3 (three) times daily. Martinique, Betty G, MD Taking Active   Blood Glucose Monitoring Suppl [facility] VERIO) w/Device Drucie Opitz [id number] No 1 kit by Does not apply route 3 (three) times daily. Dx: E11.29  Patient not taking:  Reported on 02/10/2018   Gayland Curry, DO Not Taking Active   budesonide-formoterol [facility]) 160-4.5 MCG/ACT inhaler [id number] Yes Inhale 2 puffs into the lungs 2 (two) times daily. Hubbard Hartshorn, [name] Taking Active   butalbital-acetaminophen-caffeine (FIORICET, ESGIC) [phone] MG tablet [id number] No Take 1-2 tablets by mouth every 6 (six) hours as needed.  Patient not taking:  Reported on 02/10/2018   Earleen Newport, MD Not Taking Active            Med Note Kary Kos, Garnetta Buddy Feb 10, 2018  9:22 AM) Has not picked up from pharmacy  Calcium Carbonate-Vitamin D  (CALCIUM-VITAMIN D3 PO) [id number] Yes D3 500- calcium 630 mg Take 1 capsule by mouth once daily [provider] Taking Active   cephALEXin (KEFLEX) 500 MG capsule [id number] No Take 1 capsule (500 mg total) by mouth 4 (four) times daily for 10 days.  Patient not taking:  Reported on 02/10/2018   Earleen Newport, MD Not Taking Active            Med Note Kary Kos, Garnetta Buddy Feb 10, 2018  9:06 AM) Has not started abx  clopidogrel (PLAVIX) 75 MG tablet [id number] Yes Take 1 tablet (75 mg total) by mouth daily. Hubbard Hartshorn, [name] Taking Active   cycloSPORINE (RESTASIS) 0.05 % ophthalmic emulsion [id number] Yes Place 2 drops into both eyes 2 (two) times daily. [provider] Taking Active   famotidine (PEPCID) 40 MG tablet [id number] Yes Take 1 tablet (40 mg total) by mouth daily. Hubbard Hartshorn, [name] Taking Active   fluorometholone (FML) 0.1 % ophthalmic suspension [id number] Yes Place 2 drops into both eyes 2 (two) times daily. [provider] Taking Active   fluticasone (FLONASE) 50 MCG/ACT nasal spray [id number] Yes Place 2 sprays into both nostrils daily. Lauree Chandler, NP Taking Active   gabapentin (NEURONTIN) 300 MG capsule [id number] Yes Take 1 capsule (300 mg total) by mouth 3 (three) times daily. Hubbard Hartshorn, [name] Taking Active   glucose blood [facility]  VERIO) test strip [id number]  1 each by Other route 3 (three) times daily. E11.29 Lauree Chandler, NP  Active            Med Note Kary Kos, Garnetta Buddy Feb 10, 2018  9:41 AM) "plugs into wall to work but we have lost the cord"  Insulin Glargine (TOUJEO SOLOSTAR) 300 UNIT/ML SOPN [id number] Yes Inject 100 Units into the skin daily. Martinique, Betty G, MD Taking Active   insulin lispro [facility]) 100 UNIT/ML KiwkPen [id number]  Inject 10 units with each meal plus correction (ISF 25, target 150)  Patient taking differently:  Inject 20 Units into the skin 3 (three) times daily. Inject 20 units with each meal  plus correction (ISF 25, target 150)   [place], Malka So, MD  Active            Med Note Kary Kos, Blair Heys   Thu Feb 10, 2018  9:39 AM) 25 un per meal (prior to eating) If BG is >150 mg/dL, takes 5 additional units  Insulin Pen Needle (EASY TOUCH PEN NEEDLES) 31G X 8 MM MISC [id number] Yes Use as directed in giving insulin. Dx: E11.29 Hubbard Hartshorn, [name] Taking Active   losartan (COZAAR) 50 MG tablet [id number] Yes Take 1 tablet (50 mg total) by mouth daily. Hubbard Hartshorn, [name] Taking Active   methocarbamol (ROBAXIN) 500 MG tablet [id number] No Take 1 tablet (500 mg total) by mouth 4 (four) times daily.  Patient not taking:  Reported on 02/10/2018   CuthriellCharline Bills, [name] Not Taking Active   mirabegron ER (MYRBETRIQ) 50 MG TB24 tablet [id number] Yes Take 1 tablet (50 mg total) by mouth daily. Hubbard Hartshorn, [name] Taking Active            Med Note Kary Kos, Garnetta Buddy Feb 10, 2018  9:35 AM) "doesn't work"  ondansetron (ZOFRAN-ODT) 4 MG disintegrating tablet [id number] No TAKE 1 TABLET BY MOUTH EVERY 8 HOURS AS NEEDED FOR NAUSEA OR VOMITING  Patient not taking:  Reported on 02/10/2018   Lauree Chandler, NP Not Taking Active   [facility] LANCETS 76H MISC [id number] Yes Use to check sugar three times daily as directed.Dx: E11.29; E11.40 Gildardo Cranker, DO Taking Active   pantoprazole (PROTONIX) 40 MG tablet [id number] Yes Take 1 tablet (40 mg total) by mouth daily. Hubbard Hartshorn, [name] Taking Active   polyethylene glycol [facility] / GLYCOLAX) packet [id number] Yes Take 17 g by mouth daily as needed for mild constipation. Lauree Chandler, NP Taking Active            Med Note Kary Kos, Garnetta Buddy Feb 10, 2018  9:37 AM) daily  potassium chloride SA (K-DUR,KLOR-CON) 20 MEQ tablet [id number] Yes Take 2 tablets (40 mEq total) by mouth daily. Hubbard Hartshorn, [name] Taking Active   simvastatin (ZOCOR) 40 MG tablet [id number] Yes Take 1 tablet (40 mg total) by mouth daily. Hubbard Hartshorn, [name] Taking  Active            Med Note Kary Kos, Garnetta Buddy Feb 10, 2018  9:37 AM) AM  terconazole (TERAZOL 7) 0.4 % vaginal cream [id number] Yes Place 1 applicator vaginally at bedtime. Hubbard Hartshorn, [name] Taking Active   topiramate (TOPAMAX) 200 MG tablet [id number] Yes Take 1 tablet (200 mg total) by mouth 2 (two) times daily. Hubbard Hartshorn, [name] Taking Active   TRULICITY 6.14 ER/1.5QM SOPN [id number] Yes INJECT  THE CONTENTS OF 1  PEN SUBCUTANEOUSLY WEEKLY  AS DIRECTED Elayne Snare, MD Taking Active   Med List Note Leatrice Jewels, CPhT 11/27/14 1159): CPAP at night            Medication Assistance Findings:  Extra Help:   []  Already receiving  [x]  Eligible based on reported income  []  Not Eligible based on reported income   Additional medication assistance options reviewed with patient as warranted including:   [name] Mail order programs . Insurance OTC catalogues  Goals Addressed            This Visit's Progress   . "Money is tight" (pt-stated)       Current Barriers:  . financial  Pharmacist Clinical Goal(s): Over the next 14 days, patient will collaborate with CCM pharmacist to apply for Medicare Extra Help.   Interventions: . Complete Extra Help application  Patient Self Care Activities:  [name] Gather necessary documents for Extra Help application  *initial goal documentation        Plan: Applied for Medicare Extra Help with Ms. Kral. She should receive a letter with approval or denial in the mail from the [place] in approximately 4 weeks. Instructed Ms. Kang to carefully monitor mail.   Follow up in 2 weeks  Ruben Reason, PharmD Clinical Pharmacist [facility] [phone]

## 2018-02-15 ENCOUNTER — Other Ambulatory Visit: Payer: Self-pay | Admitting: Family Medicine

## 2018-02-15 DIAGNOSIS — K219 Gastro-esophageal reflux disease without esophagitis: Secondary | ICD-10-CM

## 2018-02-15 DIAGNOSIS — E782 Mixed hyperlipidemia: Secondary | ICD-10-CM

## 2018-02-15 DIAGNOSIS — I252 Old myocardial infarction: Secondary | ICD-10-CM

## 2018-02-15 DIAGNOSIS — R51 Headache: Secondary | ICD-10-CM

## 2018-02-15 DIAGNOSIS — N3281 Overactive bladder: Secondary | ICD-10-CM

## 2018-02-15 DIAGNOSIS — R519 Headache, unspecified: Secondary | ICD-10-CM

## 2018-02-15 DIAGNOSIS — E876 Hypokalemia: Secondary | ICD-10-CM

## 2018-02-15 DIAGNOSIS — G40909 Epilepsy, unspecified, not intractable, without status epilepticus: Secondary | ICD-10-CM

## 2018-02-15 DIAGNOSIS — I10 Essential (primary) hypertension: Secondary | ICD-10-CM

## 2018-02-22 ENCOUNTER — Ambulatory Visit: Payer: Self-pay

## 2018-02-22 DIAGNOSIS — IMO0002 Reserved for concepts with insufficient information to code with codable children: Secondary | ICD-10-CM

## 2018-02-22 DIAGNOSIS — E1165 Type 2 diabetes mellitus with hyperglycemia: Principal | ICD-10-CM

## 2018-02-22 DIAGNOSIS — J984 Other disorders of lung: Secondary | ICD-10-CM

## 2018-02-22 DIAGNOSIS — E1129 Type 2 diabetes mellitus with other diabetic kidney complication: Secondary | ICD-10-CM

## 2018-02-22 NOTE — Chronic Care Management (AMB) (Signed)
  Chronic Care Management   Note  02/22/2018 Name: [name] MRN: [id number] DOB: Dec 09, 1965  Care Coordination:  [name] is a 53 year old female who sees [name], FNPfor primary care. [name] asked the CCM team to consult the patient for assistance with chronic disease management related toHTN, MI, Restrictive airway disease, DM, and chronic pain disorder and medication affordibility. Referral was placed1/06/2018. [name] met with the CCM Team in the office 1/32/2020 however the focus of that visit was to address her social determinants of health and medications assistance. Today [name] had a follow up in office appointment with CCM Team to address chronic disease management, specifically DM.  [name] did not show for her appointment today. Successful phone call to patient indicates patient has "bad cold". She is taking coricidin. She is instructed by [facility] Pharmacist to take Mucinex tablets to thin mucus and to drink plenty of water to controll sugars and thin mucus. She states her blood glucose levels are running 130s-140s however recent A1C was 12.6. She was instructed to call Thursday for a sick appointment if she was not improved or if symptoms worsened. She states her daughter is a Marine scientist and checking her temp morning and night. She remains afebrile.  She request to reschedule appointment with CCM Team and request CCM Team reschedule her appointment with PCP 02/24/2018. Appointments reschedule per patient request and patient notified.  CCM in office visit 03/03/2018 at 9:00 Physical with [name] 03/11/2018 at 10:40    Tresean Mattix E. Rollene Rotunda, RN, BSN Nurse Care Coordinator [facility] Care Management  [phone]

## 2018-02-22 NOTE — Patient Instructions (Addendum)
1. Please have your daughter pick up mucinex and take as directed. 2. Drink plenty of water to thin mucus and reduce your blood sugars. 3. You may want to check your blood sugars more often when you are sick. 4. Please call for a sick appointment if symptoms worsen or if you are not improved. 5 Your appointment with the CCM Team has bee rescheduled for 2/13/at 9:00 6. Your appointment with [name] has been rescheduled for 03/11/2018 at 10:40  CCM (Chronic Care Management) Team   Trish Fountain RN, BSN Nurse Care Coordinator  [phone]  Ruben Reason PharmD  Clinical Pharmacist  [phone]

## 2018-02-23 ENCOUNTER — Ambulatory Visit: Payer: Self-pay | Admitting: Family Medicine

## 2018-02-24 ENCOUNTER — Encounter: Payer: Medicare HMO | Admitting: Family Medicine

## 2018-03-02 DIAGNOSIS — E1142 Type 2 diabetes mellitus with diabetic polyneuropathy: Secondary | ICD-10-CM | POA: Insufficient documentation

## 2018-03-02 DIAGNOSIS — E669 Obesity, unspecified: Secondary | ICD-10-CM | POA: Insufficient documentation

## 2018-03-02 DIAGNOSIS — E782 Mixed hyperlipidemia: Secondary | ICD-10-CM | POA: Diagnosis not present

## 2018-03-02 DIAGNOSIS — I10 Essential (primary) hypertension: Secondary | ICD-10-CM | POA: Diagnosis not present

## 2018-03-02 DIAGNOSIS — Z794 Long term (current) use of insulin: Secondary | ICD-10-CM | POA: Diagnosis not present

## 2018-03-02 DIAGNOSIS — E1165 Type 2 diabetes mellitus with hyperglycemia: Secondary | ICD-10-CM | POA: Diagnosis not present

## 2018-03-03 ENCOUNTER — Ambulatory Visit: Payer: Medicare Other | Admitting: Cardiovascular Disease

## 2018-03-03 ENCOUNTER — Ambulatory Visit (INDEPENDENT_AMBULATORY_CARE_PROVIDER_SITE_OTHER): Payer: Medicare HMO

## 2018-03-03 DIAGNOSIS — IMO0002 Reserved for concepts with insufficient information to code with codable children: Secondary | ICD-10-CM

## 2018-03-03 DIAGNOSIS — E1165 Type 2 diabetes mellitus with hyperglycemia: Secondary | ICD-10-CM

## 2018-03-03 DIAGNOSIS — E1129 Type 2 diabetes mellitus with other diabetic kidney complication: Secondary | ICD-10-CM

## 2018-03-03 NOTE — Chronic Care Management (AMB) (Signed)
Chronic Care Management   Follow Calderon Note   03/03/2018 Name: [name] MRN: [id number] DOB: 07-04-1965  Referred by: Hubbard Hartshorn, [name] Reason for referral : Chronic Care Management (Pharmacy Follow Calderon)   Subjective: "I really need my medicaid because I can't afford to go to all my specialist this month" "Let's schedule another appointment for diabetes education once I have my meter"   Objective:  Assessment: Karen Dorough Mobleyis a 53year old female who sees [name], FNPfor primary care.Ms. Britt Boozer the CCM team to consult the patient for assistance with chronic disease management related toHTN, MI, Restrictive airway disease, DM, and chronic pain disorder and medication affordibility. Referral was placed1/06/2018. [name] with the CCM Team in the office 1/32/2020 however the focus of that visit was to address her social determinants of health and medications assistance. Today [name] in office appointment with CCM Team to address chronic disease management, specifically DM. Once social issues were resolved she requested to follow Calderon with CCM Team on DM education as her daughter who provided transportation needed to get back to work.  Goals Addressed            This Visit's Progress   . "I dont drive and my daughter works every day" (pt-stated)   On track    Daughter has "almost completed" Systems developer form and will return to [facility] RN CM when completed.  Current Barriers:  . Legally blind and unable to receive drivers license  Nurse Case Manager Clinical Goal(s): Over the next 14 days, patient will verbalize complete and return LINK application to Trish Fountain (nurse care manager)  Interventions:  . Needs assessed by CCM Team (transportation) . Provided LINK application   Patient Self Care Activities:  . Engage with C3 team and utilize resources provided  *initial goal documentation   Pharmacist Clinical Goal(s): Over  the next 14 days, patient will set Calderon pharmacy accounts with [place].    Patient Self Care Activities:  . Call [place] to set Calderon your accounts . Take all medications as prescribed  Interventions and Plan: . CCM pharmacist will transfer every day medications to [place] and "emergency" or one time medications at [facility] (which will deliver to your home)  *initial goal documentation     . "I have a vision difficulty" (pt-stated)       Current Barriers:  . Legally blind  Pharmacist Clinical Goal(s): Over the next 21 days, patient will complete vision assessment with Ernst Breach in order to receive Talking Label from [place].   Interventions: . Initiated Talking Label program with Janett Billow (RPh) at [place]  Patient Self Care Activities:  . Complete [place] [place] assessment . Report receipt of ScripTalk device   *initial goal documentation     . COMPLETED: "I need help getting my husbands death certificate" (pt-stated)       Current Barriers:  . transportation . financial   Nurse Case Manager Clinical Goal(s): Over the next 14 days, patient will verbalize receipt of husbands death certificate  Interventions:  . CCM RN CM spoke with Altha Harm at [place] to assist with process . CCM RN CM provided letter of need to patient for her to mail    Patient Self Care Activities:  . Patient will mail letter to [place] provided today  *initial goal documentation     . "  I need to apply to Medicaid" (pt-stated)       Current Barriers:  . Financial  . Appointments and referrals for multiple specialists with 45.00 copay  Nurse Case Manager Clinical Goal(s): Over the next 14 days patient will complete application for Medicaid services  Interventions:   Collaborated with [place] to  facilitate Medicaid application to be mailed to patients home  Discussed importance of completing application and providing appropriate documents to expedite process.   Patient Self Care Activities:  [name] Gather necessary documents for [place] Medicaid application  *initial goal documentation      Plan: CCM RN CM will follow Calderon with patient in 14 days    Mayzee Reichenbach E. Rollene Rotunda, RN, BSN Nurse Care Coordinator [facility] / [facility] Care Management  [phone]

## 2018-03-03 NOTE — Chronic Care Management (AMB) (Signed)
Chronic Care Management   Note  03/03/2018 Name: [name] MRN: [id number] DOB: May 24, 1965  Subjective:   [name] is a 53 year old patient seeing Karen Ensign, [name] for primary care. She presents for her 2 week follow up with the CCM team.   Objective: Lab Results  Component Value Date   CREATININE 0.85 02/04/2018   CREATININE 0.78 01/24/2018   CREATININE 0.89 05/18/2017    Lab Results  Component Value Date   HGBA1C 12.6 (H) 01/24/2018    Lipid Panel     Component Value Date/Time   CHOL 179 01/24/2018 1225   CHOL 167 05/08/2015 1106   TRIG 206 (H) 01/24/2018 1225   HDL 42 (L) 01/24/2018 1225   HDL 48 05/08/2015 1106   CHOLHDL 4.3 01/24/2018 1225   VLDL 25 07/30/2016 1408   LDLCALC 105 (H) 01/24/2018 1225    BP Readings from Last 3 Encounters:  02/04/18 (!) 165/99  01/24/18 128/84  12/03/17 (!) 160/99    Allergies  Allergen Reactions  . Codeine Swelling and Other (See Comments)    Swelling and burning of mouth (inside)  . Lactose Intolerance (Gi) Nausea And Vomiting    Medications Reviewed Today    Reviewed by Cathi Roan, [facility] (Pharmacist) on 03/03/18 at 1002  Med List Status: <None>  Medication Order Taking? Sig Documenting Provider Last Dose Status Informant  albuterol (PROAIR HFA) 108 (90 Base) MCG/ACT inhaler [id number]  INHALE 2 PUFFS BY MOUTH EVERY 6 HOURS AS NEEDED FOR WHEEZING OR SHORTNESS OF BREATH Hubbard Hartshorn, [name]  Active            Med Note Kary Kos, Blair Heys   Thu Feb 10, 2018  9:27 AM) Uses once a day  [place] [id number]  Medication Name: Muscle and Joint Vanishing Scent Gel- Apply as needed to skin [provider]  Active   baclofen (LIORESAL) 10 MG tablet [id number]  Take 1 tablet (10 mg total) by mouth 3 (three) times daily. Martinique, Betty G, MD  Active   Blood Glucose Monitoring Suppl (FIFTY50 GLUCOSE METER 2.0) w/Device KIT [id number] Yes Accuchek [provider]  Active    Discontinued 03/03/18 1001 (Discontinued by provider)            Med Note Kary Kos, Chara Marquard E   Thu Mar 03, 2018  9:43 AM) AccuChek Aviva- prescribed by Dr. Warnell Forester  budesonide-formoterol [facility]) 160-4.5 MCG/ACT inhaler [id number]  Inhale 2 puffs into the lungs 2 (two) times daily. Hubbard Hartshorn, [name]  Active   butalbital-acetaminophen-caffeine (FIORICET, ESGIC) [phone] MG tablet [id number]  Take 1-2 tablets by mouth every 6 (six) hours as needed.  Patient not taking:  Reported on 02/10/2018   Earleen Newport, MD  Active            Med Note Kary Kos, Garnetta Buddy Feb 10, 2018  9:22 AM) Has not picked up from pharmacy  Calcium Carbonate-Vitamin D (CALCIUM-VITAMIN D3 PO) [id number]  D3 500- calcium 630 mg Take 1 capsule by mouth once daily [provider]  Active   clopidogrel (PLAVIX) 75 MG tablet [id number]  TAKE 1 TABLET BY MOUTH EVERY DAY Hubbard Hartshorn, [name]  Active   cycloSPORINE (RESTASIS) 0.05 % ophthalmic emulsion [id number]  Place 2 drops into both eyes 2 (two) times daily. [provider]  Active   Dulaglutide 1.5 MG/0.5ML SOPN [id number] Yes Inject 1.5 mg into the skin once a week. Warnell Forester, NP  Active  famotidine (PEPCID) 40 MG tablet [id number]  TAKE 1 TABLET BY MOUTH EVERY DAY Hubbard Hartshorn, [name]  Active   fluorometholone (FML) 0.1 % ophthalmic suspension [id number]  Place 2 drops into both eyes 2 (two) times daily. [provider]  Active   fluticasone (FLONASE) 50 MCG/ACT nasal spray [id number]  Place 2 sprays into both nostrils daily. Lauree Chandler, NP  Active   gabapentin (NEURONTIN) 300 MG capsule [id number]  Take 1 capsule (300 mg total) by mouth 3 (three) times daily. Hubbard Hartshorn, [name]  Active         Discontinued 03/03/18 1002 (Discontinued by provider)            Med Note Kary Kos, Aleira Deiter E   Thu Mar 03, 2018  9:43 AM) Tereasa Coop- prescribed by Dr. Warnell Forester  glucose blood (PRECISION QID TEST) test strip  [id number] Yes Accuchek brand [provider]  Active   Insulin Glargine, 2 Unit Dial, 300 UNIT/ML SOPN [id number] Yes Inject 120 Units into the skin daily. Warnell Forester, NP  Active   insulin lispro (HUMALOG) 100 UNIT/ML KwikPen [id number] Yes Inject 20 Units into the skin 3 (three) times daily with meals. Warnell Forester, NP  Active   Insulin Pen Needle (EASY TOUCH PEN NEEDLES) 31G X 8 MM MISC [id number]  Use as directed in giving insulin. Dx: E11.29 Hubbard Hartshorn, [name]  Active   KLOR-CON M20 20 MEQ tablet [id number]  TAKE 2 TABLETS BY MOUTH EVERY DAY Hubbard Hartshorn, [name]  Active   losartan (COZAAR) 50 MG tablet [id number]  TAKE 1 TABLET BY MOUTH EVERY DAY Hubbard Hartshorn, [name]  Active   MYRBETRIQ 50 MG TB24 tablet [id number]  TAKE 1 TABLET BY MOUTH EVERY DAY Hubbard Hartshorn, [name]  Active   [facility] DELICA LANCETS 10F [place] [id number]  Use to check sugar three times daily as directed.Dx: E11.29; E11.40 Gildardo Cranker, DO  Active   pantoprazole (PROTONIX) 40 MG tablet [id number]  TAKE 1 TABLET BY MOUTH EVERY DAY Hubbard Hartshorn, [name]  Active   polyethylene glycol [facility] / GLYCOLAX) packet [id number]  Take 17 g by mouth daily as needed for mild constipation. Lauree Chandler, NP  Active            Med Note Clelia Croft Feb 10, 2018  9:37 AM) daily  simvastatin (ZOCOR) 40 MG tablet [id number]  TAKE 1 TABLET BY MOUTH EVERY DAY Hubbard Hartshorn, [name]  Active   terconazole (TERAZOL 7) 0.4 % vaginal cream [id number]  Place 1 applicator vaginally at bedtime. Hubbard Hartshorn, [name]  Active   topiramate (TOPAMAX) 200 MG tablet [id number]  TAKE 1 TABLET BY MOUTH TWICE A DAY Hubbard Hartshorn, [name]  Active   Med List Note Leatrice Jewels, CPhT 11/27/14 1159): CPAP at night           Assessment:   #Diabetes: Saw Dr. Warnell [facility]ster at [facility] for diabetes yesterday. Dr. Pasty Arch adjusted medications: Humalog 20 un TID before meals, Toujeo 120 units daily, Trulicity 5.0NL SQ weekly (2  injections of 0.43m dose). Dr. BPasty Archalso prescribed a new meter (Accuchek) that will be covered by [place] Ms. MMccallahas not checked blood sugars due to not having a working meter.   #Financial difficulty: Cannot afford copays for specialist visits. Cancelled upcoming appointments with cardiology (Dr. GRockey Situ and pain mgmt (Dr. LHolley Raring due to inability to afford copay. Has not received correspondence from Social  Security office regarding Medicare Extra Help program (applied 02/11/18). RN CM worked with patient to initiate Medicaid application   #Vision impairment: Enrolled patient in Talking Label program through [place]            This Visit's Progress   . "I dont drive and my daughter works every day" (pt-stated)   On track    Current Barriers:  . Legally blind and unable to receive drivers license  Nurse Case Manager Clinical Goal(s): Over the next 14 days, patient will verbalize complete and return [name] application to [employer] (Designer, jewellery)  Interventions:  . Needs assessed by CCM Team (transportation) . Provided LINK application   Patient Self Care Activities:  . Engage with C3 team and utilize resources provided  *initial goal documentation   Pharmacist Clinical Goal(s): Over the next 14 days, patient will set up pharmacy accounts with [place].    Patient Self Care Activities:  . Call [place] to set up your accounts . Take all medications as prescribed  Interventions and Plan: . CCM pharmacist will transfer every day medications to [place] and "emergency" or one time medications at [facility] (which will deliver to your home)  *initial goal documentation     . "I have a vision difficulty" (pt-stated)       Current Barriers:  . Legally blind  Pharmacist Clinical Goal(s): Over the  next 21 days, patient will complete vision assessment with [name] in order to receive Talking Label from [place].   Interventions: . Initiated Talking Label program with [name] (RPh) at [place]  Patient Self Care Activities:  . Complete Envision [place] assessment . Report receipt of ScripTalk device   *initial goal documentation     . COMPLETED: "I need help getting my husbands death certificate" (pt-stated)       Current Barriers:  . transportation . financial   Nurse Case Manager Clinical Goal(s): Over the next 14 days, patient will verbalize receipt of husbands death certificate  Interventions:  . CCM RN CM spoke with [name] at [place] to assist with process . CCM RN CM provided letter of need to patient for her to mail    Patient Self Care Activities:  . Patient will mail letter to [place] provided today  *initial goal documentation     . "I need to apply to Medicaid" (pt-stated)       Current Barriers:  . Financial  . Appointments and referrals for multiple specialists with 45.00 copay  Nurse Case Manager Clinical Goal(s): Over the next 14 days patient will complete application for Medicaid services  Interventions:   Collaborated with [place] to facilitate Medicaid application to be mailed to patients home  Discussed importance of completing application and providing appropriate documents to expedite process.   Patient Self Care Activities:  [name] Gather necessary documents for [facility] Medicaid application  *initial goal documentation      . "Money is tight" (pt-stated)   On track    Current Barriers:  . financial  Pharmacist Clinical Goal(s): Over the next 14 days, patient will collaborate with CCM pharmacist to apply for Medicare Extra Help.   Interventions: . Complete Extra Help application  5/05: Patient has not received any correspondence in the mail to date  Patient Self Care  Activities:  [name] Gather necessary documents for Extra  Help application . Monitor mail for Extra Help approval or denial letter  Please see past updates related to this goal by clicking on the "Past Updates" button in the selected goal        Plan: Follow up in 2 weeks via telephone regarding Extra Help and Talking Label programs   Ruben Reason, PharmD Clinical Pharmacist [place] [phone]

## 2018-03-03 NOTE — Patient Instructions (Addendum)
1.  Please monitor mail carefully for: Medicare Extra Help letter, Medicaid application  2. In the next 2 business days, please answer all telephone calls - expect a call from [place] for your talking prescription label device assessment.   3. Continue to take all medications as prescribed.   4. Begin checking and recording blood sugars when you receive your new meter from [employer].    Thank you allowing the Chronic Care Management Team to be a part of your care!   Please call a member of the CCM (Chronic Care Management) Team with any questions or case management needs:   Vanetta Mulders, BSN Nurse Care Coordinator  [phone]  Ruben Reason, PharmD  Clinical Pharmacist  [phone]   Goals Addressed            This Visit's Progress   . "I dont drive and my daughter works every day" (pt-stated)   On track    Current Barriers:  . Legally blind and unable to receive drivers license  Nurse Case Manager Clinical Goal(s): Over the next 14 days, patient will verbalize complete and return [place] application to [employer] (Designer, jewellery)  Interventions:  . Needs assessed by CCM Team (transportation) . Provided LINK application   Patient Self Care Activities:  . Engage with C3 team and utilize resources provided  *initial goal documentation   Pharmacist Clinical Goal(s): Over the next 14 days, patient will set up pharmacy accounts with [place].    Patient Self Care Activities:  . Call [place] to set up your accounts . Take all medications as prescribed  Interventions and Plan: . CCM pharmacist will transfer every day medications to [place] and "emergency" or one time medications at [facility] (which will deliver to your home)  *initial goal documentation     . "I have a vision difficulty" (pt-stated)        Current Barriers:  . Legally blind  Pharmacist Clinical Goal(s): Over the next 21 days, patient will complete vision assessment with Ernst Breach in order to receive Talking Label from [place].   Interventions: . Initiated Talking Label program with Janett Billow (RPh) at [place]  Patient Self Care Activities:  . Complete Envision [place] assessment . Report receipt of ScripTalk device   *initial goal documentation     . COMPLETED: "I need help getting my husbands death certificate" (pt-stated)       Current Barriers:  . transportation . financial   Nurse Case Manager Clinical Goal(s): Over the next 14 days, patient will verbalize receipt of husbands death certificate  Interventions:  . CCM RN CM spoke with Altha Harm at [place] to assist with process . CCM RN CM provided letter of need to patient for her to mail    Patient Self Care Activities:  . Patient will mail letter to [place] provided today  *initial goal documentation     . "I need to apply to Medicaid" (pt-stated)       Current Barriers:  . Financial  . Appointments and referrals for multiple specialists with 45.00 copay  Nurse Case Manager Clinical Goal(s): Over the next 14 days patient will complete application for Medicaid services  Interventions:   Collaborated with [place] to facilitate Medicaid application to be mailed to patients home  Discussed importance of completing application and providing appropriate documents  to expedite process.   Patient Self Care Activities:  [name] Gather necessary documents for [facility] Medicaid application  *initial goal documentation      . "Money is tight" (pt-stated)   On track    Current Barriers:  . financial  Pharmacist Clinical Goal(s): Over the next 14 days, patient will collaborate with CCM pharmacist to apply for Medicare Extra Help.   Interventions: . Complete Extra Help application  7/91: Patient  has not received any correspondence in the mail to date  Patient Self Care Activities:  [name] Gather necessary documents for Extra Help application . Monitor mail for Extra Help approval or denial letter  Please see past updates related to this goal by clicking on the "Past Updates" button in the selected goal

## 2018-03-07 ENCOUNTER — Ambulatory Visit: Payer: Medicare Other | Admitting: Cardiovascular Disease

## 2018-03-08 ENCOUNTER — Ambulatory Visit: Payer: Medicare HMO | Admitting: Student in an Organized Health Care Education/Training Program

## 2018-03-09 ENCOUNTER — Ambulatory Visit: Payer: Self-pay | Admitting: Pharmacist

## 2018-03-09 DIAGNOSIS — H548 Legal blindness, as defined in USA: Secondary | ICD-10-CM

## 2018-03-09 DIAGNOSIS — I69359 Hemiplegia and hemiparesis following cerebral infarction affecting unspecified side: Secondary | ICD-10-CM

## 2018-03-09 NOTE — Patient Instructions (Signed)
Goals Addressed            This Visit's Progress   . "I need to apply to Medicaid" (pt-stated)       Current Barriers:  . Financial  . Appointments and referrals for multiple specialists with 45.00 copay  Nurse Case Manager Clinical Goal(s): Over the next 14 days patient will complete application for Medicaid services  Update 03/08/18: Patient has not received Medicaid application in the mail from [place]  Interventions:   Collaborated with [place] DSS to facilitate Medicaid application to be mailed to patients home  Discussed importance of completing application and providing appropriate documents to expedite process.  Provided patient with [facility] contact information regarding when and where application was mailed   Patient Self Care Activities:  [name] Gather necessary documents for [facility] Medicaid application  *initial goal documentation      . COMPLETED: "Money is tight" (pt-stated)       Current Barriers:  . financial  Pharmacist Clinical Goal(s): Over the next 14 days, patient will collaborate with CCM pharmacist to apply for Medicare Extra Help.   Interventions: . Complete Extra Help application  2/57: Patient has not received any correspondence in the mail to date  Patient Self Care Activities:  [name] Gather necessary documents for Extra Help application . Monitor mail for Extra Help approval or denial letter  Please see past updates related to this goal by clicking on the "Past Updates" button in the selected goal         The patient verbalized understanding of instructions provided today and declined a print copy of patient instruction materials.

## 2018-03-09 NOTE — Chronic Care Management (AMB) (Signed)
  Chronic Care Management   Follow Up Note   03/09/2018 Name: [name] MRN: [id number] DOB: 1965/04/13  Referred by: Hubbard Hartshorn, [name] Reason for referral : Chronic Care Management (Medicaid application and Medicare Extra Help )   Subjective: Incoming call received from patient regarding her Medicaid application and her Medicare Extra Help application.    Assessment:   #Extra Help: Patient received letter from [employer] that she has been approved for [employer] Extra Help, a federal program to lower medication copays. Completed clinical pharmacy goal  #Medicaid: Patient states she has not received her application in the mail yet. At best, application would have been mailed 2/14 and Monday, 2/17 there was no USPS service due to federal holiday. Provided patient with [place] contact information to better address this issue.   Goals Addressed            This Visit's Progress   . "I need to apply to Medicaid" (pt-stated)       Current Barriers:  . Financial  . Appointments and referrals for multiple specialists with 45.00 copay  Nurse Case Manager Clinical Goal(s): Over the next 14 days patient will complete application for Medicaid services  Update 03/08/18: Patient has not received Medicaid application in the mail from [place]  Interventions:   Collaborated with  [place] DSS to facilitate [place] application to be mailed to patients home  Discussed importance of completing application and providing appropriate documents to expedite process.  Provided patient with [place] contact information regarding when and where application was mailed   Patient Self Care Activities:  [name] Gather necessary documents for [facility] Medicaid application  *initial goal documentation      . COMPLETED: "Money is tight" (pt-stated)       Current Barriers:  . financial  Pharmacist Clinical Goal(s): Over the next 14 days, patient will collaborate with CCM  pharmacist to apply for Medicare Extra Help.   Interventions: . Complete Extra Help application  8/56: Patient has not received any correspondence in the mail to date  Patient Self Care Activities:  [name] Gather necessary documents for Extra Help application . Monitor mail for Extra Help approval or denial letter  Please see past updates related to this goal by clicking on the "Past Updates" button in the selected goal          Telephone follow up appointment with CCM team member scheduled for: as previously scheduled on 2/27   Ruben Reason, PharmD Clinical Pharmacist [facility] [phone]

## 2018-03-11 ENCOUNTER — Encounter: Payer: Medicare HMO | Admitting: Family Medicine

## 2018-03-17 ENCOUNTER — Ambulatory Visit: Payer: Self-pay | Admitting: Pharmacist

## 2018-03-17 DIAGNOSIS — H548 Legal blindness, as defined in USA: Secondary | ICD-10-CM

## 2018-03-17 DIAGNOSIS — I69359 Hemiplegia and hemiparesis following cerebral infarction affecting unspecified side: Secondary | ICD-10-CM

## 2018-03-24 NOTE — Chronic Care Management (AMB) (Signed)
  Chronic Care Management   Note  03/24/2018 Name: [name] MRN: [id number] DOB: [birth year]   Incoming call from [name]. HIPAA identifiers verified.  [name] is requesting that now that she has obtained a ScripTalk device from [facility], all her prescriptions need to come from [facility] order (pharmacist's note: we previously discussed this when we registered [name] for [employer]). She requests that [name], [name], send all prescriptions to [facility].   I instructed [name] that this could be possible if she initiated the transfer of prescriptions by calling her local pharmacy but [name] declined.    Follow up plan: Telephone follow up appointment with CCM team member scheduled for: [facility], 04/28/18  Ruben Reason, PharmD Clinical Pharmacist [facility] [phone]

## 2018-03-25 ENCOUNTER — Other Ambulatory Visit: Payer: Self-pay | Admitting: Family Medicine

## 2018-03-25 ENCOUNTER — Telehealth: Payer: Self-pay | Admitting: Family Medicine

## 2018-03-25 DIAGNOSIS — I252 Old myocardial infarction: Secondary | ICD-10-CM

## 2018-03-25 DIAGNOSIS — E1142 Type 2 diabetes mellitus with diabetic polyneuropathy: Secondary | ICD-10-CM

## 2018-03-25 DIAGNOSIS — N3281 Overactive bladder: Secondary | ICD-10-CM

## 2018-03-25 DIAGNOSIS — IMO0002 Reserved for concepts with insufficient information to code with codable children: Secondary | ICD-10-CM

## 2018-03-25 DIAGNOSIS — G40909 Epilepsy, unspecified, not intractable, without status epilepticus: Secondary | ICD-10-CM

## 2018-03-25 DIAGNOSIS — E1129 Type 2 diabetes mellitus with other diabetic kidney complication: Secondary | ICD-10-CM

## 2018-03-25 DIAGNOSIS — G4733 Obstructive sleep apnea (adult) (pediatric): Secondary | ICD-10-CM

## 2018-03-25 DIAGNOSIS — R51 Headache: Secondary | ICD-10-CM

## 2018-03-25 DIAGNOSIS — K219 Gastro-esophageal reflux disease without esophagitis: Secondary | ICD-10-CM

## 2018-03-25 DIAGNOSIS — E1140 Type 2 diabetes mellitus with diabetic neuropathy, unspecified: Secondary | ICD-10-CM

## 2018-03-25 DIAGNOSIS — R519 Headache, unspecified: Secondary | ICD-10-CM

## 2018-03-25 DIAGNOSIS — E1165 Type 2 diabetes mellitus with hyperglycemia: Principal | ICD-10-CM

## 2018-03-25 DIAGNOSIS — E876 Hypokalemia: Secondary | ICD-10-CM

## 2018-03-25 MED ORDER — CLOPIDOGREL BISULFATE 75 MG PO TABS: 75.0000 mg | ORAL_TABLET | Freq: Every day | ORAL | 0 refills | Status: DC

## 2018-03-25 MED ORDER — PANTOPRAZOLE SODIUM 40 MG PO TBEC: 40.0000 mg | DELAYED_RELEASE_TABLET | Freq: Every day | ORAL | 0 refills | Status: DC

## 2018-03-25 MED ORDER — MIRABEGRON ER 50 MG PO TB24: 50.0000 mg | ORAL_TABLET | Freq: Every day | ORAL | 0 refills | Status: DC

## 2018-03-25 MED ORDER — FAMOTIDINE 40 MG PO TABS: 40.0000 mg | ORAL_TABLET | Freq: Every day | ORAL | 0 refills | Status: DC

## 2018-03-25 MED ORDER — INSULIN PEN NEEDLE 31G X 8 MM MISC: 3 refills | Status: DC

## 2018-03-25 NOTE — Telephone Encounter (Signed)
Copied from [place] [id number]. Topic: General - Other >> Mar 25, 2018  3:49 PM [name] wrote: Reason for CRM: pt called in and stated that she can not afford to go to specialist for her topiramate (TOPAMAX) 200 MG tablet [[id number]] and her Inhalers.  She stated she can not afford the copays to see them to get her refills.  She wants to know if [name] is able to just call them in for her or manage those meds ?   Please advise [phone]

## 2018-03-25 NOTE — Progress Notes (Signed)
Left voicemail to notify patient [name] sent all her medications in to [facility] and the rest will have to be requested from her other prescriptions.

## 2018-03-25 NOTE — Progress Notes (Signed)
Sent all medications that I am managing in to [facility].  She will need to reach out to her other specialists to obtain refills from them. Please notify patient.

## 2018-03-27 NOTE — Telephone Encounter (Signed)
Due to her history of seizures and previous stroke, I cannot manage her topamax.  She needs to see neurologist.  I can talk with her about her asthma and sleep apnea at her next visit, and we can manage that in office for now.

## 2018-03-28 ENCOUNTER — Other Ambulatory Visit: Payer: Self-pay

## 2018-03-28 MED ORDER — FLUTICASONE-SALMETEROL 250-50 MCG/DOSE IN AEPB: 1.0000 | INHALATION_SPRAY | Freq: Two times a day (BID) | RESPIRATORY_TRACT | 3 refills | Status: DC

## 2018-03-28 NOTE — Telephone Encounter (Signed)
Refill request was sent to [name], [name] for approval and submission.

## 2018-03-29 ENCOUNTER — Other Ambulatory Visit: Payer: Self-pay

## 2018-03-29 DIAGNOSIS — R51 Headache: Secondary | ICD-10-CM

## 2018-03-29 DIAGNOSIS — E876 Hypokalemia: Secondary | ICD-10-CM

## 2018-03-29 DIAGNOSIS — G40909 Epilepsy, unspecified, not intractable, without status epilepticus: Secondary | ICD-10-CM

## 2018-03-29 DIAGNOSIS — E1142 Type 2 diabetes mellitus with diabetic polyneuropathy: Secondary | ICD-10-CM

## 2018-03-29 DIAGNOSIS — R519 Headache, unspecified: Secondary | ICD-10-CM

## 2018-03-29 NOTE — Telephone Encounter (Signed)
Patient notified and is working with [name] and [name] on getting Medicaid. Her daughter has to help her fill it out since she is visual impaired. She is awaiting to be approved by Medicaid first due to finances for a Neurologist appointment. Needs a new prescription for this year for CPAP and her insurance requires a new one yearly for [place]. Her Oxygen setting is at a 4 right now with her CPAP. [place] [phone]

## 2018-03-29 NOTE — Telephone Encounter (Signed)
Refill request was sent to [name], [name] for approval and submission.

## 2018-03-30 MED ORDER — INSULIN GLARGINE (2 UNIT DIAL) 300 UNIT/ML ~~LOC~~ SOPN: 120.0000 [IU] | PEN_INJECTOR | Freq: Every day | SUBCUTANEOUS | 1 refills | Status: DC

## 2018-03-30 MED ORDER — INSULIN LISPRO (1 UNIT DIAL) 100 UNIT/ML (KWIKPEN): 20.0000 [IU] | PEN_INJECTOR | Freq: Three times a day (TID) | SUBCUTANEOUS | 1 refills | Status: DC

## 2018-03-30 MED ORDER — GABAPENTIN 300 MG PO CAPS: 300.0000 mg | ORAL_CAPSULE | Freq: Three times a day (TID) | ORAL | 0 refills | Status: DC

## 2018-03-30 MED ORDER — DULAGLUTIDE 1.5 MG/0.5ML ~~LOC~~ SOAJ: 1.5000 mg | SUBCUTANEOUS | 1 refills | Status: DC

## 2018-03-30 MED ORDER — TOPIRAMATE 200 MG PO TABS: 200.0000 mg | ORAL_TABLET | Freq: Two times a day (BID) | ORAL | 0 refills | Status: DC

## 2018-03-30 NOTE — Addendum Note (Signed)
Addended by: Hubbard Hartshorn on: 03/30/2018 07:50 AM   Modules accepted: Orders

## 2018-03-30 NOTE — Addendum Note (Signed)
Addended by: Inda Coke on: 03/30/2018 08:32 AM   Modules accepted: Orders

## 2018-03-30 NOTE — Telephone Encounter (Signed)
Patient was notified about her medications being sent into [facility] for 90 days until she is able to get establish with Endocrinology and Neurology. Tee'd up CPAP order. Please sign.

## 2018-03-30 NOTE — Telephone Encounter (Signed)
Diabetic and seizure medications are sent for one 90-day supply only.  She needs to see her endocrinologist for further diabetes management.  This should also give her enough time to get in with her neurologist.    Please que the CPAP orders that are needed so that I can sign off. Thanks!

## 2018-03-30 NOTE — Addendum Note (Signed)
Addended by: Hubbard Hartshorn on: 03/30/2018 08:40 AM   Modules accepted: Orders

## 2018-04-22 ENCOUNTER — Telehealth: Payer: Self-pay | Admitting: Family Medicine

## 2018-04-22 DIAGNOSIS — G4733 Obstructive sleep apnea (adult) (pediatric): Secondary | ICD-10-CM

## 2018-04-22 NOTE — Telephone Encounter (Signed)
Copied from [place] [phone]. Topic: Quick Communication - Rx Refill/Question >> Apr 22, 2018  3:44 PM Reyne Dumas L wrote: Medication:   [facility] is pt's new DME for her CPAP machine and they need new script for her CPAP machine. [facility] can be reached at [phone].

## 2018-04-25 NOTE — Addendum Note (Signed)
Addended by: Hubbard Hartshorn on: 04/25/2018 02:46 PM   Modules accepted: Orders

## 2018-04-25 NOTE — Telephone Encounter (Signed)
Please call [facility] to obtain details and provide script needed.  I will be in Thursday to sign if this requires my signature.

## 2018-04-25 NOTE — Telephone Encounter (Signed)
Please draft Rx so that I may sign and send. Thanks.

## 2018-04-25 NOTE — Telephone Encounter (Signed)
They need a CPAP prescription.

## 2018-04-25 NOTE — Addendum Note (Signed)
Addended by: Inda Coke on: 04/25/2018 01:53 PM   Modules accepted: Orders

## 2018-04-28 ENCOUNTER — Ambulatory Visit: Payer: Medicare HMO

## 2018-04-28 ENCOUNTER — Ambulatory Visit: Payer: Medicare HMO | Admitting: Family Medicine

## 2018-05-12 DIAGNOSIS — G4733 Obstructive sleep apnea (adult) (pediatric): Secondary | ICD-10-CM | POA: Diagnosis not present

## 2018-05-12 DIAGNOSIS — J449 Chronic obstructive pulmonary disease, unspecified: Secondary | ICD-10-CM | POA: Diagnosis not present

## 2018-05-27 ENCOUNTER — Telehealth: Payer: Self-pay

## 2018-05-27 DIAGNOSIS — R519 Headache, unspecified: Secondary | ICD-10-CM

## 2018-05-27 DIAGNOSIS — G40909 Epilepsy, unspecified, not intractable, without status epilepticus: Secondary | ICD-10-CM

## 2018-05-27 MED ORDER — TOPIRAMATE 200 MG PO TABS: 200.0000 mg | ORAL_TABLET | Freq: Two times a day (BID) | ORAL | 0 refills | Status: DC

## 2018-05-27 NOTE — Telephone Encounter (Signed)
LVM to sch

## 2018-05-27 NOTE — Telephone Encounter (Signed)
Sent15 day supply to [facility] for topamax.  Must come in for appt in next 2 weeks. Please schedule.

## 2018-06-06 DIAGNOSIS — E1165 Type 2 diabetes mellitus with hyperglycemia: Secondary | ICD-10-CM | POA: Diagnosis not present

## 2018-06-06 DIAGNOSIS — Z794 Long term (current) use of insulin: Secondary | ICD-10-CM | POA: Diagnosis not present

## 2018-06-06 DIAGNOSIS — E782 Mixed hyperlipidemia: Secondary | ICD-10-CM | POA: Diagnosis not present

## 2018-06-08 DIAGNOSIS — Z794 Long term (current) use of insulin: Secondary | ICD-10-CM | POA: Diagnosis not present

## 2018-06-08 DIAGNOSIS — E1165 Type 2 diabetes mellitus with hyperglycemia: Secondary | ICD-10-CM | POA: Diagnosis not present

## 2018-06-08 DIAGNOSIS — E1142 Type 2 diabetes mellitus with diabetic polyneuropathy: Secondary | ICD-10-CM | POA: Diagnosis not present

## 2018-06-08 DIAGNOSIS — I10 Essential (primary) hypertension: Secondary | ICD-10-CM | POA: Diagnosis not present

## 2018-06-08 DIAGNOSIS — E669 Obesity, unspecified: Secondary | ICD-10-CM | POA: Diagnosis not present

## 2018-06-08 DIAGNOSIS — E782 Mixed hyperlipidemia: Secondary | ICD-10-CM | POA: Diagnosis not present

## 2018-06-15 ENCOUNTER — Telehealth: Payer: Self-pay

## 2018-06-15 NOTE — Telephone Encounter (Signed)
Called patient left voicemail need to know what her cpap pressure is in order for her to get supplies

## 2018-06-15 NOTE — Telephone Encounter (Signed)
Cpap form faxed in

## 2018-06-15 NOTE — Telephone Encounter (Signed)
Pt states that cpap pressure is a 4.

## 2018-06-15 NOTE — Telephone Encounter (Signed)
Roselyn Reef to address patient about Cpap

## 2018-06-21 ENCOUNTER — Encounter: Payer: Medicare HMO | Admitting: Family Medicine

## 2018-06-23 ENCOUNTER — Encounter: Payer: Self-pay | Admitting: Family Medicine

## 2018-06-23 ENCOUNTER — Other Ambulatory Visit: Payer: Self-pay

## 2018-06-23 ENCOUNTER — Ambulatory Visit (INDEPENDENT_AMBULATORY_CARE_PROVIDER_SITE_OTHER): Payer: Medicare HMO | Admitting: Family Medicine

## 2018-06-23 VITALS — BP 136/92 | HR 88 | Temp 98.0°F | Resp 16 | Ht 66.0 in | Wt 215.0 lb

## 2018-06-23 DIAGNOSIS — E1165 Type 2 diabetes mellitus with hyperglycemia: Secondary | ICD-10-CM

## 2018-06-23 DIAGNOSIS — E1140 Type 2 diabetes mellitus with diabetic neuropathy, unspecified: Secondary | ICD-10-CM | POA: Diagnosis not present

## 2018-06-23 DIAGNOSIS — Z01419 Encounter for gynecological examination (general) (routine) without abnormal findings: Secondary | ICD-10-CM | POA: Diagnosis not present

## 2018-06-23 DIAGNOSIS — Z23 Encounter for immunization: Secondary | ICD-10-CM

## 2018-06-23 DIAGNOSIS — B3731 Acute candidiasis of vulva and vagina: Secondary | ICD-10-CM

## 2018-06-23 DIAGNOSIS — Z1239 Encounter for other screening for malignant neoplasm of breast: Secondary | ICD-10-CM | POA: Diagnosis not present

## 2018-06-23 DIAGNOSIS — Z124 Encounter for screening for malignant neoplasm of cervix: Secondary | ICD-10-CM | POA: Diagnosis not present

## 2018-06-23 DIAGNOSIS — N3281 Overactive bladder: Secondary | ICD-10-CM | POA: Diagnosis not present

## 2018-06-23 DIAGNOSIS — B373 Candidiasis of vulva and vagina: Secondary | ICD-10-CM | POA: Diagnosis not present

## 2018-06-23 DIAGNOSIS — IMO0002 Reserved for concepts with insufficient information to code with codable children: Secondary | ICD-10-CM

## 2018-06-23 MED ORDER — TERCONAZOLE 0.4 % VA CREA: 1.0000 | TOPICAL_CREAM | Freq: Every day | VAGINAL | 0 refills | Status: DC

## 2018-06-23 NOTE — Patient Instructions (Signed)
Preventive Care 40-64 Years, Female Preventive care refers to lifestyle choices and visits with your health care provider that can promote health and wellness. What does preventive care include?   A yearly physical exam. This is also called an annual well check.  Dental exams once or twice a year.  Routine eye exams. Ask your health care provider how often you should have your eyes checked.  Personal lifestyle choices, including: ? Daily care of your teeth and gums. ? Regular physical activity. ? Eating a healthy diet. ? Avoiding tobacco and drug use. ? Limiting alcohol use. ? Practicing safe sex. ? Taking low-dose aspirin daily starting at age 50. ? Taking vitamin and mineral supplements as recommended by your health care provider. What happens during an annual well check? The services and screenings done by your health care provider during your annual well check will depend on your age, overall health, lifestyle risk factors, and family history of disease. Counseling Your health care provider may ask you questions about your:  Alcohol use.  Tobacco use.  Drug use.  Emotional well-being.  Home and relationship well-being.  Sexual activity.  Eating habits.  Work and work environment.  Method of birth control.  Menstrual cycle.  Pregnancy history. Screening You may have the following tests or measurements:  Height, weight, and BMI.  Blood pressure.  Lipid and cholesterol levels. These may be checked every 5 years, or more frequently if you are over 50 years old.  Skin check.  Lung cancer screening. You may have this screening every year starting at age 55 if you have a 30-pack-year history of smoking and currently smoke or have quit within the past 15 years.  Colorectal cancer screening. All adults should have this screening starting at age 50 and continuing until age 75. Your health care provider may recommend screening at age 45. You will have tests every  1-10 years, depending on your results and the type of screening test. People at increased risk should start screening at an earlier age. Screening tests may include: ? Guaiac-based fecal occult blood testing. ? Fecal immunochemical test (FIT). ? Stool DNA test. ? Virtual colonoscopy. ? Sigmoidoscopy. During this test, a flexible tube with a tiny camera (sigmoidoscope) is used to examine your rectum and lower colon. The sigmoidoscope is inserted through your anus into your rectum and lower colon. ? Colonoscopy. During this test, a long, thin, flexible tube with a tiny camera (colonoscope) is used to examine your entire colon and rectum.  Hepatitis C blood test.  Hepatitis B blood test.  Sexually transmitted disease (STD) testing.  Diabetes screening. This is done by checking your blood sugar (glucose) after you have not eaten for a while (fasting). You may have this done every 1-3 years.  Mammogram. This may be done every 1-2 years. Talk to your health care provider about when you should start having regular mammograms. This may depend on whether you have a family history of breast cancer.  BRCA-related cancer screening. This may be done if you have a family history of breast, ovarian, tubal, or peritoneal cancers.  Pelvic exam and Pap test. This may be done every 3 years starting at age 21. Starting at age 30, this may be done every 5 years if you have a Pap test in combination with an HPV test.  Bone density scan. This is done to screen for osteoporosis. You may have this scan if you are at high risk for osteoporosis. Discuss your test results, treatment options,   age 30, this may be done every 5 years if you have a Pap test in combination with an HPV test.   Bone density scan. This is done to screen for osteoporosis. You may have this scan if you are at high risk for osteoporosis.  Discuss your test results, treatment options, and if necessary, the need for more tests with your health care provider.  Vaccines  Your health care provider may recommend certain vaccines, such as:   Influenza vaccine. This is recommended every year.   Tetanus, diphtheria, and acellular pertussis (Tdap, Td) vaccine. You may need a Td booster every 10 years.   Varicella  vaccine. You may need this if you have not been vaccinated.   Zoster vaccine. You may need this after age 60.   Measles, mumps, and rubella (MMR) vaccine. You may need at least one dose of MMR if you were born in 1957 or later. You may also need a second dose.   Pneumococcal 13-valent conjugate (PCV13) vaccine. You may need this if you have certain conditions and were not previously vaccinated.   Pneumococcal polysaccharide (PPSV23) vaccine. You may need one or two doses if you smoke cigarettes or if you have certain conditions.   Meningococcal vaccine. You may need this if you have certain conditions.   Hepatitis A vaccine. You may need this if you have certain conditions or if you travel or work in places where you may be exposed to hepatitis A.   Hepatitis B vaccine. You may need this if you have certain conditions or if you travel or work in places where you may be exposed to hepatitis B.   Haemophilus influenzae type b (Hib) vaccine. You may need this if you have certain conditions.  Talk to your health care provider about which screenings and vaccines you need and how often you need them.  This information is not intended to replace advice given to you by your health care provider. Make sure you discuss any questions you have with your health care provider.  Document Released: 02/01/2015 Document Revised: 02/25/2017 Document Reviewed: 11/06/2014  Elsevier Interactive Patient Education  2019 [employer]

## 2018-06-23 NOTE — Progress Notes (Signed)
Name: [name]   MRN: [id number]    DOB: 1965-12-30   Date:06/23/2018       Progress Note  Subjective  Chief Complaint  Chief Complaint  Patient presents with  . Annual Exam    HPI  Patient presents for annual CPE.  Diet: Her diet is healthy - she says she has been really working on her diet.  Exercise: Doing a lot of walking with her daughters  USPSTF grade A and B recommendations    Office Visit from 06/23/2018 in [facility]  AUDIT-C Score  0     Depression: Phq 9 is  negative Depression screen [facility] 2/9 06/23/2018 01/24/2018 09/25/2016 08/07/2016 07/15/2016  Decreased Interest 0 0 0 0 0  Down, Depressed, Hopeless 0 0 0 0 0  PHQ - 2 Score 0 0 0 0 0  Altered sleeping 0 0 - - -  Tired, decreased energy 0 0 - - -  Change in appetite 0 0 - - -  Feeling bad or failure about yourself  0 0 - - -  Trouble concentrating 0 0 - - -  Moving slowly or fidgety/restless 0 0 - - -  Suicidal thoughts 0 0 - - -  PHQ-9 Score 0 0 - - -  Difficult doing work/chores Not difficult at all Not difficult at all - - -  Some recent data might be hidden   Hypertension: BP Readings from Last 3 Encounters:  06/23/18 (!) 136/92  02/04/18 (!) 165/99  01/24/18 128/84   Obesity: Wt Readings from Last 3 Encounters:  06/23/18 215 lb (97.5 kg)  02/04/18 220 lb (99.8 kg)  01/24/18 220 lb 14.4 oz (100.2 kg)   BMI Readings from Last 3 Encounters:  06/23/18 34.70 kg/m  02/04/18 35.51 kg/m  01/24/18 35.65 kg/m    Hep C Screening: Had this done previously and was negative per her report, declines today. STD testing and prevention (HIV/chl/gon/syphilis): Negative HIV 2015.  No new partners; declines any screening today. She does note a history of herpes simplex.  Intimate partner violence: No concerns Sexual History/Pain during Intercourse: Has not had intercourse in over 2 years, no vaginal pain.  Menstrual History/LMP/Abnormal Bleeding: No vaginal bleeding. Had hysterectomy  at age 24, unsure if cervix was left or not.   Incontinence Symptoms: Occasional stress or urge incontinence - has been on detrol, and ditropan in the past without relief.  Advanced Care Planning: A voluntary discussion about advance care planning including the explanation and discussion of advance directives.  Discussed health care proxy and Living will, and the patient was able to identify a health care proxy as her daughter.  Patient does have a living will at present time. The living will is scanned into the chart - her daughter is her proxy as her husband has passed away.  Breast cancer: Due to mammogram No results found for: [facility]  BRCA gene screening: N/A Cervical cancer screening: HAd hysterectomy, but unclear if cervix was removed - will do Pap today  Osteoporosis Screening: Taking supplement  No results found for: HMDEXASCAN  Lipids:  Lab Results  Component Value Date   CHOL 179 01/24/2018   CHOL 144 07/30/2016   CHOL 182 05/06/2016   Lab Results  Component Value Date   HDL 42 (L) 01/24/2018   HDL 39 (L) 07/30/2016   HDL 46 (L) 05/06/2016   Lab Results  Component Value Date   LDLCALC 105 (H) 01/24/2018   [place] 80 07/30/2016  LDLCALC 112 (H) 05/06/2016   Lab Results  Component Value Date   TRIG 206 (H) 01/24/2018   TRIG 123 07/30/2016   TRIG 122 05/06/2016   Lab Results  Component Value Date   CHOLHDL 4.3 01/24/2018   CHOLHDL 3.7 07/30/2016   CHOLHDL 4.0 05/06/2016   No results found for: LDLDIRECT  Glucose:  Glucose, Bld  Date Value Ref Range Status  02/04/2018 237 (H) 70 - 99 mg/dL Final  01/24/2018 220 (H) 65 - 99 mg/dL Final    Comment:    .            Fasting reference interval . For someone without known diabetes, a glucose value >125 mg/dL indicates that they may have diabetes and this should be confirmed with a follow-up test. .   05/18/2017 240 (H) 70 - 99 mg/dL Final   Glucose-Capillary  Date Value Ref Range Status  02/04/2018  174 (H) 70 - 99 mg/dL Final  03/01/2014 105 (H) 70 - 99 mg/dL Final  03/01/2014 121 (H) 70 - 99 mg/dL Final    Skin cancer: No concerning lesions Colorectal cancer: Did cologuard - she thinks it was last year, and it was negative; will request records. Lung cancer:  Low Dose CT Chest recommended if Age 70-80 years, 30 pack-year currently smoking OR have quit w/in 15years. Patient does not qualify.   ECG: On file; denies chest pain or shortness of breath.  Patient Active Problem List   Diagnosis Date Noted  . Vitamin D deficiency 01/24/2018  . Vulvovaginal candidiasis 01/24/2018  . Hypokalemia 06/08/2017  . Overactive bladder 05/18/2017  . Asthma 06/22/2016  . Dilated aortic root ([place])   . Sacroiliac dysfunction 10/09/2014  . Chronic low back pain 09/03/2014  . CVA, old, hemiparesis ([place]) 09/03/2014  . OSA (obstructive sleep apnea) 05/15/2014  . Restrictive airway disease 05/15/2014  . Colitis 02/22/2014  . Type 2 diabetes, uncontrolled, with renal manifestation ([place]) 01/21/2014  . Chronic pain disorder 01/21/2014  . Headache disorder 09/07/2013  . Diabetic neuropathy ([place]) 05/23/2013  . Mixed urge and stress incontinence 08/25/2011  . Ureteral stricture, right 08/25/2011  . Pyelonephritis 08/22/2011  . Nephrolithiasis 08/22/2011  . Incisional hernia 05/10/2011  . Morbid obesity ([place]) 05/10/2011  . Tachycardia 05/08/2011  . BLINDNESS, [place] DEFINITION 09/14/2006  . Hyperlipemia 09/13/2006  . Essential hypertension 09/13/2006  . MYOCARDIAL INFARCTION, HX OF 09/13/2006  . GERD 09/13/2006  . Seizure disorder ([place]) 09/13/2006    Past Surgical History:  Procedure Laterality Date  . BRAIN HEMATOMA EVACUATION     five procedures total, first procedure when 32 months old, last at 53 years of age.  . CYSTOSCOPY W/ RETROGRADES  09/24/2011   Procedure: CYSTOSCOPY WITH RETROGRADE PYELOGRAM;  Surgeon: Malka So, MD;  Location: [facility];  Service: Urology;  Laterality: Right;  .  CYSTOSCOPY WITH URETEROSCOPY  09/24/2011   Procedure: CYSTOSCOPY WITH URETEROSCOPY;  Surgeon: Malka So, MD;  Location: [facility];  Service: Urology;  Laterality: Right;  Balloon dilation right ureter   . CYSTOSCOPY/RETROGRADE/URETEROSCOPY  08/24/2011   Procedure: CYSTOSCOPY/RETROGRADE/URETEROSCOPY;  Surgeon: Molli Hazard, MD;  Location: [facility];  Service: Urology;  Laterality: Right;  Cysto, Right retrograde Pyelogram, right stent placement.   Fatima Blank HERNIA REPAIR  05/05/2011   Procedure: LAPAROSCOPIC INCISIONAL HERNIA;  Surgeon: Rolm Bookbinder, MD;  Location: [place];  Service: General;  Laterality: N/A;  . kidney stone removal    . MASS EXCISION  05/05/2011   Procedure: EXCISION  MASS;  Surgeon: Rolm Bookbinder, MD;  Location: [place];  Service: General;  Laterality: Right;  . PCNL    . RETINAL DETACHMENT SURGERY Left 1990  . SHUNT REMOVAL     shunt inserted at age 88 removed at age 46   . VAGINAL HYSTERECTOMY  1996    Family History  Problem Relation Age of Onset  . Uterine cancer Mother   . Hypertension Mother   . Cancer Mother   . Brain cancer Maternal Grandmother   . Hypertension Maternal Grandmother   . Birth defects Daughter   . Hypertension Daughter   . Cirrhosis Maternal Grandfather   . ADD / ADHD Maternal Grandfather   . Birth defects Maternal Grandfather   . Diabetes Maternal Grandfather   . Anesthesia problems Neg Hx   . Colon cancer Neg Hx   . Esophageal cancer Neg Hx   . Pancreatic cancer Neg Hx     Social History   Socioeconomic History  . Marital status: Widowed    Spouse name: Not on file  . Number of children: 2  . Years of education: Not on file  . Highest education level: Not on file  Occupational History  . Occupation: disabled  Social Needs  . Financial resource strain: Not on file  . Food insecurity:    Worry: Never true    Inability: Never true  . Transportation needs:    Medical: No    Non-medical: No  Tobacco Use  . Smoking  status: Former Smoker    Packs/day: 1.00    Years: 15.00    Pack years: 15.00    Types: Cigarettes    Last attempt to quit: 01/20/1987    Years since quitting: 31.4  . Smokeless tobacco: Never Used  . Tobacco comment: quit more than 20years  Substance and Sexual Activity  . Alcohol use: Yes    Alcohol/week: 0.0 standard drinks    Comment: 1 glass weekly  . Drug use: No    Comment: hx of marijuana use no longer uses   . Sexual activity: Yes    Partners: Male    Birth control/protection: Surgical  Lifestyle  . Physical activity:    Days per week: 2 days    Minutes per session: 30 min  . Stress: Only a little  Relationships  . Social connections:    Talks on phone: More than three times a week    Gets together: More than three times a week    Attends religious service: More than 4 times per year    Active member of club or organization: Yes    Attends meetings of clubs or organizations: More than 4 times per year    Relationship status: Widowed  . Intimate partner violence:    Fear of current or ex partner: No    Emotionally abused: No    Physically abused: No    Forced sexual activity: No  Other Topics Concern  . Not on file  Social History Narrative  . Not on file     Current Outpatient Medications:  .  albuterol (PROAIR HFA) 108 (90 Base) MCG/ACT inhaler, INHALE 2 PUFFS BY MOUTH EVERY 6 HOURS AS NEEDED FOR WHEEZING OR SHORTNESS OF BREATH, Disp: 3 Inhaler, Rfl: 3 .  AMBULATORY NON FORMULARY MEDICATION, Medication Name: Muscle and Joint Vanishing Scent Gel- Apply as needed to skin, Disp: , Rfl:  .  baclofen (LIORESAL) 10 MG tablet, Take 1 tablet (10 mg total) by mouth 3 (three) times daily.,  Disp: 90 tablet, Rfl: 6 .  Blood Glucose Monitoring Suppl (FIFTY50 GLUCOSE METER 2.0) w/Device KIT, Accuchek, Disp: , Rfl:  .  Calcium Carbonate-Vitamin D (CALCIUM-VITAMIN D3 PO), D3 500- calcium 630 mg Take 1 capsule by mouth once daily, Disp: , Rfl:  .  clopidogrel (PLAVIX) 75 MG  tablet, Take 1 tablet (75 mg total) by mouth daily., Disp: 90 tablet, Rfl: 0 .  cycloSPORINE (RESTASIS) 0.05 % ophthalmic emulsion, Place 2 drops into both eyes 2 (two) times daily., Disp: , Rfl:  .  Dulaglutide 1.5 MG/0.5ML SOPN, Inject 1.5 mg into the skin once a week., Disp: 1.5 mL, Rfl: 1 .  famotidine (PEPCID) 40 MG tablet, Take 1 tablet (40 mg total) by mouth daily., Disp: 90 tablet, Rfl: 0 .  fluorometholone (FML) 0.1 % ophthalmic suspension, Place 2 drops into both eyes 2 (two) times daily., Disp: , Rfl:  .  fluticasone (FLONASE) 50 MCG/ACT nasal spray, Place 2 sprays into both nostrils daily., Disp: 16 g, Rfl: 3 .  Fluticasone-Salmeterol (ADVAIR DISKUS) 250-50 MCG/DOSE AEPB, Inhale 1 puff into the lungs 2 (two) times daily., Disp: 1 each, Rfl: 3 .  gabapentin (NEURONTIN) 300 MG capsule, Take 1 capsule (300 mg total) by mouth 3 (three) times daily., Disp: 270 capsule, Rfl: 0 .  glucose blood (PRECISION QID TEST) test strip, Accuchek brand, Disp: , Rfl:  .  insulin aspart (NOVOLOG FLEXPEN) 100 UNIT/ML FlexPen, Inject into the skin., Disp: , Rfl:  .  Insulin Glargine, 2 Unit Dial, 300 UNIT/ML SOPN, Inject 120 Units into the skin daily., Disp: 15 mL, Rfl: 1 .  Insulin Pen Needle (EASY TOUCH PEN NEEDLES) 31G X 8 MM MISC, Use as directed in giving insulin. Dx: E11.29, Disp: 400 each, Rfl: 3 .  KLOR-CON M20 20 MEQ tablet, TAKE 2 TABLETS BY MOUTH EVERY DAY, Disp: 60 tablet, Rfl: 0 .  losartan (COZAAR) 50 MG tablet, TAKE 1 TABLET BY MOUTH EVERY DAY, Disp: 90 tablet, Rfl: 0 .  mirabegron ER (MYRBETRIQ) 50 MG TB24 tablet, Take 1 tablet (50 mg total) by mouth daily., Disp: 90 tablet, Rfl: 0 .  ONETOUCH DELICA LANCETS 27O MISC, Use to check sugar three times daily as directed.Dx: E11.29; E11.40, Disp: 300 each, Rfl: 3 .  pantoprazole (PROTONIX) 40 MG tablet, Take 1 tablet (40 mg total) by mouth daily., Disp: 90 tablet, Rfl: 0 .  polyethylene glycol (MIRALAX / GLYCOLAX) packet, Take 17 g by mouth daily  as needed for mild constipation., Disp: 100 each, Rfl: 3 .  simvastatin (ZOCOR) 40 MG tablet, TAKE 1 TABLET BY MOUTH EVERY DAY, Disp: 90 tablet, Rfl: 0 .  terconazole (TERAZOL 7) 0.4 % vaginal cream, Place 1 applicator vaginally at bedtime., Disp: 45 g, Rfl: 0 .  topiramate (TOPAMAX) 200 MG tablet, Take 1 tablet (200 mg total) by mouth 2 (two) times daily., Disp: 30 tablet, Rfl: 0 .  butalbital-acetaminophen-caffeine (FIORICET, ESGIC) 50-325-40 MG tablet, Take 1-2 tablets by mouth every 6 (six) hours as needed. (Patient not taking: Reported on 02/10/2018), Disp: 20 tablet, Rfl: 0 .  insulin lispro (HUMALOG) 100 UNIT/ML KwikPen, Inject 0.2 mLs (20 Units total) into the skin 3 (three) times daily with meals. (Patient not taking: Reported on 06/23/2018), Disp: 15 mL, Rfl: 1  Allergies  Allergen Reactions  . Codeine Swelling and Other (See Comments)    Swelling and burning of mouth (inside)  . Lactose Intolerance (Gi) Nausea And Vomiting     ROS  Ten systems reviewed and is negative  except as mentioned in HPI.  Objective  Vitals:   06/23/18 0942  BP: (!) 136/92  Pulse: 88  Resp: 16  Temp: 98 F (36.7 C)  TempSrc: Oral  SpO2: 98%  Weight: 215 lb (97.5 kg)  Height: 5' 6"  (1.676 m)    Body mass index is 34.7 kg/m.  Physical Exam  Constitutional: Patient appears well-developed and well-nourished. No distress.  HENT: Head: Normocephalic and atraumatic. Ears: B TMs ok, no erythema or effusion; Nose: Nose normal. Mouth/Throat: Oropharynx is clear and moist. No oropharyngeal exudate.  Eyes: Conjunctivae and EOM are normal. Pupils are equal, round, and reactive to light. No scleral icterus.  Neck: Normal range of motion. Neck supple. No JVD present. No thyromegaly present.  Cardiovascular: Normal rate, regular rhythm and normal heart sounds.  No murmur heard. No BLE edema. Pulmonary/Chest: Effort normal and breath sounds normal. No respiratory distress. Abdominal: Soft. Bowel sounds are  normal, no distension. There is no tenderness. no masses Breast: no lumps or masses, no nipple discharge or rashes FEMALE GENITALIA:  External genitalia normal External urethra normal Vaginal vault normal without discharge or lesions Cervix normal without discharge or lesions Bimanual exam normal without masses Musculoskeletal: Normal range of motion, no joint effusions. No gross deformities Neurological: he is alert and oriented to person, place, and time. No cranial nerve deficit. Coordination, balance, strength, speech and gait are normal.  Skin: Skin is warm and dry. No rash noted. No erythema.  Psychiatric: Patient has a normal mood and affect. behavior is normal. Judgment and thought content normal.   No results found for this or any previous visit (from the past 2160 hour(s)).  PHQ2/9: Depression screen [facility] 2/9 06/23/2018 01/24/2018 09/25/2016 08/07/2016 07/15/2016  Decreased Interest 0 0 0 0 0  Down, Depressed, Hopeless 0 0 0 0 0  PHQ - 2 Score 0 0 0 0 0  Altered sleeping 0 0 - - -  Tired, decreased energy 0 0 - - -  Change in appetite 0 0 - - -  Feeling bad or failure about yourself  0 0 - - -  Trouble concentrating 0 0 - - -  Moving slowly or fidgety/restless 0 0 - - -  Suicidal thoughts 0 0 - - -  PHQ-9 Score 0 0 - - -  Difficult doing work/chores Not difficult at all Not difficult at all - - -  Some recent data might be hidden   Fall Risk: Fall Risk  06/23/2018 01/24/2018 11/11/2016 09/25/2016 08/17/2016  Falls in the past year? 0 0 No No No  Number falls in past yr: 0 0 - - -  Injury with Fall? 0 0 - - -  Risk for fall due to : - - - - -  Risk for fall due to: Comment - - - - -  Follow up - - - - -    Assessment & Plan  1. Well woman exam with routine gynecological exam -USPSTF grade A and B recommendations reviewed with patient; age-appropriate recommendations, preventive care, screening tests, etc discussed and encouraged; healthy living encouraged; see AVS for patient  education given to patient -Discussed importance of 150 minutes of physical activity weekly, eat two servings of fish weekly, eat one serving of tree nuts ( cashews, pistachios, pecans, almonds.[name]) every other day, eat 6 servings of fruit/vegetables daily and drink plenty of water and avoid sweet beverages.  - MM 3D SCREEN BREAST BILATERAL; Future  2. DM type 2, uncontrolled, with neuropathy ([place]) -  Ambulatory referral to Ophthalmology  3. Vulvovaginal candidiasis - terconazole (TERAZOL 7) 0.4 % vaginal cream; Place 1 applicator vaginally at bedtime.  Dispense: 45 g; Refill: 0  4. Overactive bladder - Ambulatory referral to Urology  5. Breast cancer screening - MM 3D SCREEN BREAST BILATERAL; Future

## 2018-06-24 ENCOUNTER — Other Ambulatory Visit (HOSPITAL_COMMUNITY)
Admission: RE | Admit: 2018-06-24 | Discharge: 2018-06-24 | Disposition: A | Discharge status: Home / Self Care | Payer: Medicare HMO | Source: Ambulatory Visit | Attending: Family Medicine | Admitting: Family Medicine

## 2018-06-24 DIAGNOSIS — Z124 Encounter for screening for malignant neoplasm of cervix: Secondary | ICD-10-CM | POA: Diagnosis not present

## 2018-06-24 DIAGNOSIS — Z01419 Encounter for gynecological examination (general) (routine) without abnormal findings: Secondary | ICD-10-CM | POA: Insufficient documentation

## 2018-06-25 ENCOUNTER — Other Ambulatory Visit: Payer: Self-pay | Admitting: Family Medicine

## 2018-06-25 DIAGNOSIS — I10 Essential (primary) hypertension: Secondary | ICD-10-CM

## 2018-06-27 DIAGNOSIS — E669 Obesity, unspecified: Secondary | ICD-10-CM | POA: Diagnosis not present

## 2018-06-27 DIAGNOSIS — E1165 Type 2 diabetes mellitus with hyperglycemia: Secondary | ICD-10-CM | POA: Diagnosis not present

## 2018-06-27 DIAGNOSIS — E1142 Type 2 diabetes mellitus with diabetic polyneuropathy: Secondary | ICD-10-CM | POA: Diagnosis not present

## 2018-06-27 DIAGNOSIS — E782 Mixed hyperlipidemia: Secondary | ICD-10-CM | POA: Diagnosis not present

## 2018-06-27 DIAGNOSIS — I10 Essential (primary) hypertension: Secondary | ICD-10-CM | POA: Diagnosis not present

## 2018-06-27 DIAGNOSIS — Z794 Long term (current) use of insulin: Secondary | ICD-10-CM | POA: Diagnosis not present

## 2018-06-27 LAB — CYTOLOGY - PAP: Diagnosis: NEGATIVE

## 2018-06-28 ENCOUNTER — Telehealth: Payer: Self-pay | Admitting: Pulmonary Disease

## 2018-06-28 DIAGNOSIS — G4733 Obstructive sleep apnea (adult) (pediatric): Secondary | ICD-10-CM | POA: Diagnosis not present

## 2018-06-28 DIAGNOSIS — J4550 Severe persistent asthma, uncomplicated: Secondary | ICD-10-CM | POA: Diagnosis not present

## 2018-06-28 DIAGNOSIS — J449 Chronic obstructive pulmonary disease, unspecified: Secondary | ICD-10-CM | POA: Diagnosis not present

## 2018-06-28 DIAGNOSIS — Z9989 Dependence on other enabling machines and devices: Secondary | ICD-10-CM | POA: Diagnosis not present

## 2018-06-28 NOTE — Telephone Encounter (Signed)
Called [facility] to ask that release of records be faxed to our office. Spoke with: Felicia Nothing further needed.

## 2018-07-04 DIAGNOSIS — J449 Chronic obstructive pulmonary disease, unspecified: Secondary | ICD-10-CM | POA: Diagnosis not present

## 2018-07-05 ENCOUNTER — Encounter: Payer: Self-pay | Admitting: Family Medicine

## 2018-07-05 DIAGNOSIS — J449 Chronic obstructive pulmonary disease, unspecified: Secondary | ICD-10-CM | POA: Diagnosis not present

## 2018-07-06 DIAGNOSIS — J449 Chronic obstructive pulmonary disease, unspecified: Secondary | ICD-10-CM | POA: Diagnosis not present

## 2018-07-06 DIAGNOSIS — G4733 Obstructive sleep apnea (adult) (pediatric): Secondary | ICD-10-CM | POA: Diagnosis not present

## 2018-07-08 DIAGNOSIS — Z87898 Personal history of other specified conditions: Secondary | ICD-10-CM | POA: Insufficient documentation

## 2018-07-08 DIAGNOSIS — Z8673 Personal history of transient ischemic attack (TIA), and cerebral infarction without residual deficits: Secondary | ICD-10-CM | POA: Insufficient documentation

## 2018-07-08 DIAGNOSIS — E1349 Other specified diabetes mellitus with other diabetic neurological complication: Secondary | ICD-10-CM | POA: Diagnosis not present

## 2018-07-14 ENCOUNTER — Ambulatory Visit: Payer: Medicare HMO | Admitting: Family Medicine

## 2018-07-19 ENCOUNTER — Ambulatory Visit: Payer: Medicare HMO | Admitting: Family Medicine

## 2018-07-19 DIAGNOSIS — E113291 Type 2 diabetes mellitus with mild nonproliferative diabetic retinopathy without macular edema, right eye: Secondary | ICD-10-CM | POA: Diagnosis not present

## 2018-07-19 LAB — HM DIABETES EYE EXAM

## 2018-07-21 ENCOUNTER — Other Ambulatory Visit: Payer: Self-pay

## 2018-07-21 ENCOUNTER — Ambulatory Visit (INDEPENDENT_AMBULATORY_CARE_PROVIDER_SITE_OTHER): Payer: Medicare HMO | Admitting: Family Medicine

## 2018-07-21 ENCOUNTER — Other Ambulatory Visit: Payer: Self-pay | Admitting: Acute Care

## 2018-07-21 ENCOUNTER — Other Ambulatory Visit (HOSPITAL_COMMUNITY): Payer: Self-pay | Admitting: Acute Care

## 2018-07-21 ENCOUNTER — Encounter: Payer: Self-pay | Admitting: Family Medicine

## 2018-07-21 VITALS — BP 148/86 | HR 97 | Temp 97.7°F | Resp 14 | Ht 66.0 in | Wt 213.2 lb

## 2018-07-21 DIAGNOSIS — K219 Gastro-esophageal reflux disease without esophagitis: Secondary | ICD-10-CM

## 2018-07-21 DIAGNOSIS — G894 Chronic pain syndrome: Secondary | ICD-10-CM

## 2018-07-21 DIAGNOSIS — E1129 Type 2 diabetes mellitus with other diabetic kidney complication: Secondary | ICD-10-CM

## 2018-07-21 DIAGNOSIS — E1165 Type 2 diabetes mellitus with hyperglycemia: Secondary | ICD-10-CM

## 2018-07-21 DIAGNOSIS — IMO0002 Reserved for concepts with insufficient information to code with codable children: Secondary | ICD-10-CM

## 2018-07-21 DIAGNOSIS — G40909 Epilepsy, unspecified, not intractable, without status epilepticus: Secondary | ICD-10-CM | POA: Diagnosis not present

## 2018-07-21 DIAGNOSIS — E876 Hypokalemia: Secondary | ICD-10-CM | POA: Diagnosis not present

## 2018-07-21 DIAGNOSIS — R51 Headache: Secondary | ICD-10-CM

## 2018-07-21 DIAGNOSIS — I10 Essential (primary) hypertension: Secondary | ICD-10-CM

## 2018-07-21 DIAGNOSIS — N3946 Mixed incontinence: Secondary | ICD-10-CM

## 2018-07-21 DIAGNOSIS — I252 Old myocardial infarction: Secondary | ICD-10-CM

## 2018-07-21 DIAGNOSIS — I69359 Hemiplegia and hemiparesis following cerebral infarction affecting unspecified side: Secondary | ICD-10-CM

## 2018-07-21 DIAGNOSIS — E669 Obesity, unspecified: Secondary | ICD-10-CM

## 2018-07-21 DIAGNOSIS — M545 Low back pain, unspecified: Secondary | ICD-10-CM

## 2018-07-21 DIAGNOSIS — G4733 Obstructive sleep apnea (adult) (pediatric): Secondary | ICD-10-CM | POA: Diagnosis not present

## 2018-07-21 DIAGNOSIS — J4520 Mild intermittent asthma, uncomplicated: Secondary | ICD-10-CM

## 2018-07-21 DIAGNOSIS — J984 Other disorders of lung: Secondary | ICD-10-CM

## 2018-07-21 DIAGNOSIS — N3281 Overactive bladder: Secondary | ICD-10-CM

## 2018-07-21 DIAGNOSIS — I639 Cerebral infarction, unspecified: Secondary | ICD-10-CM

## 2018-07-21 DIAGNOSIS — B373 Candidiasis of vulva and vagina: Secondary | ICD-10-CM

## 2018-07-21 DIAGNOSIS — E11319 Type 2 diabetes mellitus with unspecified diabetic retinopathy without macular edema: Secondary | ICD-10-CM

## 2018-07-21 DIAGNOSIS — I77810 Thoracic aortic ectasia: Secondary | ICD-10-CM

## 2018-07-21 DIAGNOSIS — H548 Legal blindness, as defined in USA: Secondary | ICD-10-CM

## 2018-07-21 DIAGNOSIS — E1142 Type 2 diabetes mellitus with diabetic polyneuropathy: Secondary | ICD-10-CM

## 2018-07-21 DIAGNOSIS — G8929 Other chronic pain: Secondary | ICD-10-CM

## 2018-07-21 DIAGNOSIS — B3731 Acute candidiasis of vulva and vagina: Secondary | ICD-10-CM

## 2018-07-21 DIAGNOSIS — E7800 Pure hypercholesterolemia, unspecified: Secondary | ICD-10-CM

## 2018-07-21 DIAGNOSIS — R519 Headache, unspecified: Secondary | ICD-10-CM

## 2018-07-21 DIAGNOSIS — E559 Vitamin D deficiency, unspecified: Secondary | ICD-10-CM

## 2018-07-21 DIAGNOSIS — E1169 Type 2 diabetes mellitus with other specified complication: Secondary | ICD-10-CM

## 2018-07-21 MED ORDER — MIRABEGRON ER 50 MG PO TB24: 50.0000 mg | ORAL_TABLET | Freq: Every day | ORAL | 0 refills | Status: DC

## 2018-07-21 MED ORDER — CIMETIDINE 300 MG PO TABS: 300.0000 mg | ORAL_TABLET | Freq: Two times a day (BID) | ORAL | 1 refills | Status: DC

## 2018-07-21 MED ORDER — PANTOPRAZOLE SODIUM 40 MG PO TBEC: 40.0000 mg | DELAYED_RELEASE_TABLET | Freq: Every day | ORAL | 0 refills | Status: DC

## 2018-07-21 MED ORDER — BACLOFEN 10 MG PO TABS: 10.0000 mg | ORAL_TABLET | Freq: Three times a day (TID) | ORAL | 6 refills | Status: DC

## 2018-07-21 MED ORDER — TERCONAZOLE 0.4 % VA CREA: 1.0000 | TOPICAL_CREAM | Freq: Every day | VAGINAL | 0 refills | Status: DC

## 2018-07-21 MED ORDER — LOSARTAN POTASSIUM 100 MG PO TABS: 100.0000 mg | ORAL_TABLET | Freq: Every day | ORAL | 3 refills | Status: DC

## 2018-07-21 NOTE — Progress Notes (Signed)
Name: [name]   MRN: [id number]    DOB: Apr 30, 1965   Date:07/21/2018       Progress Note  Subjective  Chief Complaint  Chief Complaint  Patient presents with  . Follow-up  . Medication Refill    HPI  Social: Her husband passed away 20-Oct-2017, she was kicked out of her apartment in [place] due to lack of income, and she is now living with her daughter.  Asthma/OSA/Restrictive Airway Disease: She is seeing Dr. Raul Del and Dr. Laural Roes for management at this time. Using CPAP nightly and is very compliant. She is neb treatments twice daily. Denies shortness of breath, endorses some wheezing and coughing.  HTN: BP is elevated today.  Was on verapamil at night, but it was too expensive - out since 10/21/22. She has been avoiding salt completely. She is taking losartan 23m alone.  She denies chest pain, shortness of breath, BLE edema. Endorses ongoing headaches. We will increase losartan today.  Suspect elevation is secondary to pain today.  Hypokalemia: She takes potassium supplement for low levels - she is not on diuretic at this time. 457m once daily. LAst check in May 2020 showed normal K+.  HLD: No myalgia, no chest pain, shortness of breath.  Taking simvastatin daily. Managed by endocrinology at this time; last lipids were not at goal with LDL  108.  History of CVA: Had 3 strokes back to back in 2003, between 2003-2013 she had 8 strokes. LEFT side still weak sometimes - grip is weak, function of left hand is diminished. She has been on Plavix 7556mor years now. Denies signs or symptoms of bleeding.  She is now seeing neurology - see below for details of recent change sin function and planned evaluation.  Epilepsy/Hx CVA: [facility] [employer], notes she has history of seizures in her sleep as well.  No seizures in about 3 years. She is having pain in her thoracic spine, having pain, numbness/tingling and some weakness in BUE and BLE.  She is having to use  her cane again - which she has not needed for over a year.  She has needed help with bathing. She notes 2 falls in the last 2 weeks.  She did see AshChipper Herb-C with neurology and reported these new symptoms at her visit on 07/08/2018.  Stepp NP-C ordered MRI, referral to cardiology, continuation of plavix, statin therapy, EEG, and wean off of topamax and onto Keppra.  Increased gabapentin dosing for her pain.  Chronic Pain/Low Back Pain: She notes significant flare in her pain for about 5-6 days now.  She is having to use her cane again - she is seeing neurology for this at present - see above.  Stepp NP-C with neuro did increase gabapentin.  She is out of her baclofen - we will send this in today.  Headaches: She is taking tylenol only, helps sometimes. She is being weaned off of topamax as above.  She is getting 2-3 headaches a day.  Pain is frontal and bilateral.  Seeing neurology for care at this time.   DM2 with CKD and neuropathy: HAving elevated BG's lately - lowest she has seen is 180, highest in the last 2 weeks has been 490. Trulicity weekly, Taking humalog TID, she is also on 135units toujeo.  She is seeing HilWarnell Forester-C with endocrinology - last visit 06/27/2018 with increases in her insulin at that time. She has neuropathy in the LEFT side - this has been  ongoing for about 30 years.  Denies polyphagia or polyuria. Endorses some polydipsia. Gabapentin was increased by nuerology as above.   Morbid Obesity: She has been losing weight - watching what she eats, and is walking a lot more.  She has not been able to walk much in the last week with her pain flare, but prior to this she was walking quite a bit. Diet is balanced.  Incontinence/Overactive Bladder: Taking Myrbetriq - she notes sometimes she can control her urine and sometimes she can't. She has had increase in urinary urgency lately, but feels this is due to elevated BG's.  GERD: She is pantoprazole; used to take Zantac  but it was recalled. She watches her diet closely, takes zofran every now and then for nausea. No blood in stool, no dark and tarry stools.  Recurrent Vaginal Candidiasis: Uses terconazole PRN. She is doing well right now but requests refill.  HX MI, dilated aortic root:  Was referred to cardiology by Neurologist at her last visit, but has not heard anything yet.  I will also place order today to ensure this is accomplished.  No chest pain or shortness of breath  Legally Blind: She had recent eye exam showing diabetic retinopathy in the right eye.  She is also legally blind in the left eye.  Patient Active Problem List   Diagnosis Date Noted  . Vitamin D deficiency 01/24/2018  . Vulvovaginal candidiasis 01/24/2018  . Hypokalemia 06/08/2017  . Overactive bladder 05/18/2017  . Asthma 06/22/2016  . Dilated aortic root ([place])   . Sacroiliac dysfunction 10/09/2014  . Chronic low back pain 09/03/2014  . CVA, old, hemiparesis ([place]) 09/03/2014  . OSA (obstructive sleep apnea) 05/15/2014  . Restrictive airway disease 05/15/2014  . Colitis 02/22/2014  . Type 2 diabetes, uncontrolled, with renal manifestation ([place]) 01/21/2014  . Chronic pain disorder 01/21/2014  . Headache disorder 09/07/2013  . Diabetic neuropathy ([place]) 05/23/2013  . Mixed urge and stress incontinence 08/25/2011  . Ureteral stricture, right 08/25/2011  . Pyelonephritis 08/22/2011  . Nephrolithiasis 08/22/2011  . Incisional hernia 05/10/2011  . Morbid obesity ([place]) 05/10/2011  . Tachycardia 05/08/2011  . BLINDNESS, [place], [place] DEFINITION 09/14/2006  . Hyperlipemia 09/13/2006  . Essential hypertension 09/13/2006  . MYOCARDIAL INFARCTION, HX OF 09/13/2006  . GERD 09/13/2006  . Seizure disorder ([place]) 09/13/2006    Past Surgical History:  Procedure Laterality Date  . BRAIN HEMATOMA EVACUATION     five procedures total, first procedure when 15 months old, last at 53 years of age.  . CYSTOSCOPY W/ RETROGRADES  09/24/2011    Procedure: CYSTOSCOPY WITH RETROGRADE PYELOGRAM;  Surgeon: Malka So, MD;  Location: [facility];  Service: Urology;  Laterality: Right;  . CYSTOSCOPY WITH URETEROSCOPY  09/24/2011   Procedure: CYSTOSCOPY WITH URETEROSCOPY;  Surgeon: Malka So, MD;  Location: [facility];  Service: Urology;  Laterality: Right;  Balloon dilation right ureter   . CYSTOSCOPY/RETROGRADE/URETEROSCOPY  08/24/2011   Procedure: CYSTOSCOPY/RETROGRADE/URETEROSCOPY;  Surgeon: Molli Hazard, MD;  Location: [facility];  Service: Urology;  Laterality: Right;  Cysto, Right retrograde Pyelogram, right stent placement.   Fatima Blank HERNIA REPAIR  05/05/2011   Procedure: LAPAROSCOPIC INCISIONAL HERNIA;  Surgeon: Rolm Bookbinder, MD;  Location: [facility];  Service: General;  Laterality: N/A;  . kidney stone removal    . MASS EXCISION  05/05/2011   Procedure: EXCISION MASS;  Surgeon: Rolm Bookbinder, MD;  Location: [facility];  Service: General;  Laterality: Right;  . PCNL    .  RETINAL DETACHMENT SURGERY Left 1990  . SHUNT REMOVAL     shunt inserted at age 37 removed at age 38   . VAGINAL HYSTERECTOMY  1996    Family History  Problem Relation Age of Onset  . Uterine cancer Mother   . Hypertension Mother   . Cancer Mother   . Brain cancer Maternal Grandmother   . Hypertension Maternal Grandmother   . Birth defects Daughter   . Hypertension Daughter   . Cirrhosis Maternal Grandfather   . ADD / ADHD Maternal Grandfather   . Birth defects Maternal Grandfather   . Diabetes Maternal Grandfather   . Anesthesia problems Neg Hx   . Colon cancer Neg Hx   . Esophageal cancer Neg Hx   . Pancreatic cancer Neg Hx     Social History   Socioeconomic History  . Marital status: Widowed    Spouse name: Not on file  . Number of children: 2  . Years of education: Not on file  . Highest education level: Not on file  Occupational History  . Occupation: disabled  Social Needs  . Financial resource strain: Not on file  . Food insecurity     Worry: Never true    Inability: Never true  . Transportation needs    Medical: No    Non-medical: No  Tobacco Use  . Smoking status: Former Smoker    Packs/day: 1.00    Years: 15.00    Pack years: 15.00    Types: Cigarettes    Quit date: 01/20/1987    Years since quitting: 31.5  . Smokeless tobacco: Never Used  . Tobacco comment: quit more than 20years  Substance and Sexual Activity  . Alcohol use: Yes    Alcohol/week: 0.0 standard drinks    Comment: 1 glass weekly  . Drug use: No    Comment: hx of marijuana use no longer uses   . Sexual activity: Yes    Partners: Male    Birth control/protection: Surgical  Lifestyle  . Physical activity    Days per week: 2 days    Minutes per session: 30 min  . Stress: Only a little  Relationships  . Social connections    Talks on phone: More than three times a week    Gets together: More than three times a week    Attends religious service: More than 4 times per year    Active member of club or organization: Yes    Attends meetings of clubs or organizations: More than 4 times per year    Relationship status: Widowed  . Intimate partner violence    Fear of current or ex partner: No    Emotionally abused: No    Physically abused: No    Forced sexual activity: No  Other Topics Concern  . Not on file  Social History Narrative  . Not on file     Current Outpatient Medications:  .  albuterol (PROAIR HFA) 108 (90 Base) MCG/ACT inhaler, INHALE 2 PUFFS BY MOUTH EVERY 6 HOURS AS NEEDED FOR WHEEZING OR SHORTNESS OF BREATH, Disp: 3 Inhaler, Rfl: 3 .  baclofen (LIORESAL) 10 MG tablet, Take 1 tablet (10 mg total) by mouth 3 (three) times daily., Disp: 90 tablet, Rfl: 6 .  butalbital-acetaminophen-caffeine (FIORICET, ESGIC) 50-325-40 MG tablet, Take 1-2 tablets by mouth every 6 (six) hours as needed., Disp: 20 tablet, Rfl: 0 .  Calcium Carbonate-Vitamin D (CALCIUM-VITAMIN D3 PO), D3 500- calcium 630 mg Take 1 capsule by mouth once  daily,  Disp: , Rfl:  .  clopidogrel (PLAVIX) 75 MG tablet, Take 1 tablet (75 mg total) by mouth daily., Disp: 90 tablet, Rfl: 0 .  cycloSPORINE (RESTASIS) 0.05 % ophthalmic emulsion, Place 2 drops into both eyes 2 (two) times daily., Disp: , Rfl:  .  Dulaglutide 1.5 MG/0.5ML SOPN, Inject 1.5 mg into the skin once a week., Disp: 1.5 mL, Rfl: 1 .  famotidine (PEPCID) 40 MG tablet, Take 1 tablet (40 mg total) by mouth daily., Disp: 90 tablet, Rfl: 0 .  fluorometholone (FML) 0.1 % ophthalmic suspension, Place 2 drops into both eyes 2 (two) times daily., Disp: , Rfl:  .  fluticasone (FLONASE) 50 MCG/ACT nasal spray, Place 2 sprays into both nostrils daily., Disp: 16 g, Rfl: 3 .  Fluticasone-Salmeterol (ADVAIR DISKUS) 250-50 MCG/DOSE AEPB, Inhale 1 puff into the lungs 2 (two) times daily., Disp: 1 each, Rfl: 3 .  gabapentin (NEURONTIN) 400 MG capsule, Take 400 mg by mouth 3 (three) times daily., Disp: , Rfl:  .  insulin aspart (NOVOLOG FLEXPEN) 100 UNIT/ML FlexPen, Inject into the skin., Disp: , Rfl:  .  Insulin Glargine, 2 Unit Dial, 300 UNIT/ML SOPN, Inject 120 Units into the skin daily., Disp: 15 mL, Rfl: 1 .  insulin lispro (HUMALOG) 100 UNIT/ML KwikPen, Inject 0.2 mLs (20 Units total) into the skin 3 (three) times daily with meals., Disp: 15 mL, Rfl: 1 .  KLOR-CON M20 20 MEQ tablet, TAKE 2 TABLETS BY MOUTH EVERY DAY, Disp: 60 tablet, Rfl: 0 .  levETIRAcetam (KEPPRA) 500 MG tablet, Take 500 mg by mouth 2 (two) times daily., Disp: , Rfl:  .  losartan (COZAAR) 50 MG tablet, TAKE 1 TABLET EVERY DAY, Disp: 90 tablet, Rfl: 0 .  mirabegron ER (MYRBETRIQ) 50 MG TB24 tablet, Take 1 tablet (50 mg total) by mouth daily., Disp: 90 tablet, Rfl: 0 .  pantoprazole (PROTONIX) 40 MG tablet, Take 1 tablet (40 mg total) by mouth daily., Disp: 90 tablet, Rfl: 0 .  polyethylene glycol (MIRALAX / GLYCOLAX) packet, Take 17 g by mouth daily as needed for mild constipation., Disp: 100 each, Rfl: 3 .  simvastatin (ZOCOR) 40 MG  tablet, TAKE 1 TABLET BY MOUTH EVERY DAY, Disp: 90 tablet, Rfl: 0 .  terconazole (TERAZOL 7) 0.4 % vaginal cream, Place 1 applicator vaginally at bedtime., Disp: 45 g, Rfl: 0 .  topiramate (TOPAMAX) 100 MG tablet, Take 100 mg by mouth at bedtime., Disp: , Rfl:  .  AMBULATORY NON FORMULARY MEDICATION, Medication Name: Muscle and Joint Vanishing Scent Gel- Apply as needed to skin, Disp: , Rfl:  .  Blood Glucose Monitoring Suppl (FIFTY50 GLUCOSE METER 2.0) w/Device KIT, Accuchek, Disp: , Rfl:  .  gabapentin (NEURONTIN) 300 MG capsule, Take 1 capsule (300 mg total) by mouth 3 (three) times daily. (Patient not taking: Reported on 07/21/2018), Disp: 270 capsule, Rfl: 0 .  glucose blood (PRECISION QID TEST) test strip, Accuchek brand, Disp: , Rfl:  .  Insulin Pen Needle (EASY TOUCH PEN NEEDLES) 31G X 8 MM MISC, Use as directed in giving insulin. Dx: E11.29, Disp: 400 each, Rfl: 3 .  ONETOUCH DELICA LANCETS 44W MISC, Use to check sugar three times daily as directed.Dx: E11.29; E11.40, Disp: 300 each, Rfl: 3 .  topiramate (TOPAMAX) 200 MG tablet, Take 1 tablet (200 mg total) by mouth 2 (two) times daily. (Patient not taking: Reported on 07/21/2018), Disp: 30 tablet, Rfl: 0  Allergies  Allergen Reactions  . Codeine Swelling and Other (  See Comments)    Swelling and burning of mouth (inside)  . Lactose Intolerance (Gi) Nausea And Vomiting    I personally reviewed active problem list, medication list, allergies, notes from last encounter, lab results with the patient/caregiver today.   ROS  Ten systems reviewed and is negative except as mentioned in HPI  Objective  Vitals:   07/21/18 0849  BP: (!) 162/94  Pulse: 97  Resp: 14  Temp: 97.7 F (36.5 C)  SpO2: 98%  Weight: 213 lb 3.2 oz (96.7 kg)  Height: 5' 6"  (1.676 m)   Body mass index is 34.41 kg/m.  Physical Exam Constitutional: Patient appears well-developed and well-nourished. No distress.  HENT: Head: Normocephalic and atraumatic. Eyes:  Conjunctivae and EOM are normal. No scleral icterus. Neck: Normal range of motion. Neck supple. No JVD present. No thyromegaly present.  Cardiovascular: Normal rate, regular rhythm and normal heart sounds.  No murmur heard. No BLE edema. Pulmonary/Chest: Effort normal and breath sounds normal. No respiratory distress. Musculoskeletal: Normal range of motion, no joint effusions. No gross deformities Neurological: Pt is alert and oriented to person, place, and time. No cranial nerve deficit. Coordination, balance, strength, speech and gait are normal.  Skin: Skin is warm and dry. No rash noted. No erythema.  Psychiatric: Patient has a normal mood and affect. behavior is normal. Judgment and thought content normal.  No results found for this or any previous visit (from the past 72 hour(s)).   PHQ2/9: Depression screen [facility] 2/9 07/21/2018 06/23/2018 01/24/2018 09/25/2016 08/07/2016  Decreased Interest 0 0 0 0 0  Down, Depressed, Hopeless 0 0 0 0 0  PHQ - 2 Score 0 0 0 0 0  Altered sleeping 0 0 0 - -  Tired, decreased energy 0 0 0 - -  Change in appetite 0 0 0 - -  Feeling bad or failure about yourself  0 0 0 - -  Trouble concentrating 0 0 0 - -  Moving slowly or fidgety/restless 0 0 0 - -  Suicidal thoughts 0 0 0 - -  PHQ-9 Score 0 0 0 - -  Difficult doing work/chores Not difficult at all Not difficult at all Not difficult at all - -  Some recent data might be hidden   PHQ-2/9 Result is negative.    Fall Risk: Fall Risk  07/21/2018 06/23/2018 01/24/2018 11/11/2016 09/25/2016  Falls in the past year? 1 0 0 No No  Number falls in past yr: 1 0 0 - -  Injury with Fall? 1 0 0 - -  Risk for fall due to : - - - - -  Risk for fall due to: Comment - - - - -  Follow up - - - - -    Functional Status Survey: Is the patient deaf or have difficulty hearing?: No Does the patient have difficulty seeing, even when wearing glasses/contacts?: Yes Does the patient have difficulty concentrating, remembering, or  making decisions?: No Does the patient have difficulty walking or climbing stairs?: Yes Does the patient have difficulty dressing or bathing?: Yes Does the patient have difficulty doing errands alone such as visiting a doctor's office or shopping?: Yes  Assessment & Plan  1. Restrictive airway disease - Continue with pulmonology  2. OSA (obstructive sleep apnea) - Continue with pulmonology and with CPAP nightly.  3. Mild intermittent asthma without complication - Continue with pulmonology  4. Essential hypertension - Increased dose today from 69m to 1067mof Losartan due to elevation, follow up  with BP recheck in 2 weeks. - losartan (COZAAR) 100 MG tablet; Take 1 tablet (100 mg total) by mouth daily.  Dispense: 90 tablet; Refill: 3 - Ambulatory referral to Cardiology  5. Hypokalemia - Will check BMP at next visit; had normal K+ with [facility] in May 2020.  6. Hypercholesterolemia - See labs in Care everywhere, continue current regimen - managed by endocrinology  7. CVA, old, hemiparesis ([place]) - Continue with neurology - advised to call to schedule MRI today as she has not heard back and it has been a week.  8. Seizure disorder ([place]) - Continue current medication regimen and with neurology.  9. Headache disorder - Continue current medication regimen - weaning down and off of topiramate.  Continue with neurology  10. Chronic pain disorder - Refill baclofen - baclofen (LIORESAL) 10 MG tablet; Take 1 tablet (10 mg total) by mouth 3 (three) times daily.  Dispense: 90 tablet; Refill: 6  11. Chronic bilateral low back pain, unspecified whether sciatica present - baclofen (LIORESAL) 10 MG tablet; Take 1 tablet (10 mg total) by mouth 3 (three) times daily.  Dispense: 90 tablet; Refill: 6  12. Diabetes mellitus type 2 in obese [facility]) - Seeing endocrinology, BG's have been recently poorly controlled - she has had insulin dosing adjusted recently - recommend she continue to be in touch  with endocrine regarding her persistently elevated BG's as this may be a part of her urinary frequency.  13. Diabetic polyneuropathy associated with type 2 diabetes mellitus ([place]) - Ongoing, stable  14. Morbid obesity ([place]) - Discussed importance of 150 minutes of physical activity weekly, eat two servings of fish weekly, eat one serving of tree nuts ( cashews, pistachios, pecans, almonds.[name]) every other day, eat 6 servings of fruit/vegetables daily and drink plenty of water and avoid sweet beverages.   15. Mixed urge and stress incontinence - We will evaluate further in 2 weeks - allowing her time to decrease her BG's to see if this is contributing.  Continue myrbetriq for now - may increase dosing or change/add on medications  16. Gastroesophageal reflux disease without esophagitis - pantoprazole (PROTONIX) 40 MG tablet; Take 1 tablet (40 mg total) by mouth daily.  Dispense: 90 tablet; Refill: 0 - cimetidine (TAGAMET) 300 MG tablet; Take 1 tablet (300 mg total) by mouth 2 (two) times daily.  Dispense: 180 tablet; Refill: 1  17. Dilated aortic root ([place]) - Ambulatory referral to Cardiology  18. Type 2 diabetes, uncontrolled, with renal manifestation ([facility]) - losartan (COZAAR) 100 MG tablet; Take 1 tablet (100 mg total) by mouth daily.  Dispense: 90 tablet; Refill: 3  19. MYOCARDIAL INFARCTION, HX OF - Ambulatory referral to Cardiology  20. BLINDNESS, [place], [place] DEFINITION - Seeing opthamology  21. Vulvovaginal candidiasis - terconazole (TERAZOL 7) 0.4 % vaginal cream; Place 1 applicator vaginally at bedtime.  Dispense: 45 g; Refill: 0  22. Vitamin D deficiency - Taking supplement  23. Diabetic retinopathy of right eye associated with type 2 diabetes mellitus, macular edema presence unspecified, unspecified retinopathy severity ([place]) - New diagnosis per her report from ophthalmology.  Advised to maintain regular follow ups and get BG's back until control  24. Overactive bladder -  See above. - mirabegron ER (MYRBETRIQ) 50 MG TB24 tablet; Take 1 tablet (50 mg total) by mouth daily.  Dispense: 90 tablet; Refill: 0

## 2018-07-21 NOTE — Patient Instructions (Addendum)
Call neurology to schedule your MRI Increase your Losartan to 167m once daily.

## 2018-07-25 NOTE — Progress Notes (Deleted)
Cardiology Office Note Date:  07/26/2018  Patient ID:  Karen, Calderon 23-Apr-1965, MRN [id number] PCP:  Hubbard Hartshorn, [name]  Cardiologist:  Dr. Radford Pax, MD (wants to establish in the [facility] office)  ***refresh   Chief Complaint: Reestablish care  History of Present Illness: [name] is a 53 y.o. female with history of reported MI x 2 remotely with the last being in 2003 in the setting of a CVA without any documentation for review, multiple strokes established with neurology, diastolic dysfunction, poorly controlled DM2 with diabetic neuropathy, grand mal and petit mal seizure disorder with most recent seizure occurring in 2013 followed by neurology, dilated aortic root/ascending aorta, COPD, HTN, HLD, heart murmur at birth, legally blind in the left eye, anxiety, depression, glaucoma, migraine disorder, brain hematoma removed age 78 months and 4 years, renal stones, low back pain, asthma, obesity, OSA now compliant with CPAP, and GERD who presents to reestablish care in the setting of patient reported CAD and dilated aortic root/ascending aorta.  She was previously evaluated by cardiology in 2013 for postoperative tachycardia that was felt to be secondary to physiologic stress with ileus and abdominal pain  Patient was last seen by Dr. Heron Nay on 09/11/2015 for routine follow up and was doing well with chronic shortness of breath that had been attributed to her asthma.  She had previously been scheduled for an echo several years prior though this was not completed.  In this setting, she underwent echo and Lexiscan Myoview given her comorbid risk factors in 11/2015.  Echo on 12/11/2015 showed an EF of 60 to 65%, mild LVH, no regional wall motion normalities, grade 1 diastolic dysfunction, mild aortic valve insufficiency, dilated aortic root measuring 38 mm, mildly dilated ascending aorta.  She underwent Lexiscan Myoview on 12/17/2015 that showed normal perfusion, EF 53%, and was  an overall low risk study.  She has been lost to follow up since. She was seen by neurology and PCP recently for routine follow-up and referred to cardiology to reestablish care given it has been since 2017 since we last saw her.  ***  Labs: 01/2018 - hemoglobin 13.6, platelet 287, potassium 3.5, glucose 237, serum creatinine 0.85, albumin 4.1, AST/ALT normal, A1c 12.6, TSH normal, triglyceride 206, LDL 105  Past Medical History:  Diagnosis Date   Anxiety and depression 09/13/2006   Qualifier: Diagnosis of  By: Hassell Done [name], Nykedtra     Arthritis    joint pain    Asthma    COPD (chronic obstructive pulmonary disease) ([place])    chronic bronchitis    Depression    Diabetes mellitus    since age 71; type 2 IDDM   Diabetic neuropathy, painful ([place])    FEET AND HANDS   Diabetic retinopathy ([place])    Diastolic dysfunction    a.  Echo 11/17: EF 60-65%, mild LVH, no RWMA, Gr1DD, mild AI, dilated aortic root measuring 38 mm, mildly dilated ascending aorta   Dilated aortic root ([place])    23m by echo 11/2015   Dizziness    secondary to diabetes and hypertension    Epilepsy idiopathic petit mal ([place]    last seizure 2012;controlled w/ topomax   GERD (gastroesophageal reflux disease)    Glaucoma    NOT ON ANY EYE DROPS    Headache(784.0)    migraines    Heart murmur    born with    History of stress test    a. 11/17: Normal perfusion, EF 53%, normal study  Hx of blood clots    hematomas removed from left side of brain from 32mo to 89yrold    Hyperlipidemia    Hypertension    Legally blind    left eye    MIGRAINE HEADACHE 09/14/2006   Qualifier: Diagnosis of  By: MaHassell DoneNP, Nykedtra     Myocardial infarction ([facility]   a.  Patient reported history of without objective documentation   Nephrolithiasis    frequent urination , urination at nite  PT SEEN IN ER 09/22/11 FOR BACK AND RT SIDED PAIN--HAS KNOWN STONE RT URETER AND UA IN ER SHOWED UTI   Pain 09/23/11    LOWER BACK AND RIGHT SIDE--PT HAS RIGHT URETERAL STONE   Pneumonia    hx of 2009   Pyelonephritis    Seizures ([facility]   Sleep ap[facility]    [facility];does not use Cp[facility] ; mild   Stress incontinence    Stroke ([facility]   last 2003  RESIDUAL LEFT LEG WEAKNESS--NO OTHER RESIDUAL PROBLEMS    Past Surgical History:  Procedure Laterality Date   BRAIN HEMATOMA EVACUATION     five procedures total, first procedure when 1169 monthsld, last at [age]ears of age.   CYSTOSCOPY W/ RETROGRADES  09/24/2011   Procedure: CYSTOSCOPY WITH RETROGRADE PYELOGRAM;  Surgeon: JoMalka SoMD;  Location: [facility];  Service: Urology;  Laterality: Right;   CYSTOSCOPY WITH URETEROSCOPY  09/24/2011   Procedure: CYSTOSCOPY WITH URETEROSCOPY;  Surgeon: JoMalka SoMD;  Location: [facility];  Service: Urology;  Laterality: Right;  Balloon dilation right ureter    CYSTOSCOPY/RETROGRADE/URETEROSCOPY  08/24/2011   Procedure: CYSTOSCOPY/RETROGRADE/URETEROSCOPY;  Surgeon: DaMolli HazardMD;  Location: [facility];  Service: Urology;  Laterality: Right;  Cysto, Right retrograde Pyelogram, right stent placement.    INCISIONAL HERNIA REPAIR  05/05/2011   Procedure: LAPAROSCOPIC INCISIONAL HERNIA;  Surgeon: MaRolm BookbinderMD;  [place] [facility] Service: General;  Laterality: N/A;   kidney stone removal     MASS EXCISION  05/05/2011   Procedure: EXCISION MASS;  Surgeon: MaRolm BookbinderMD;  [place] Service: General;  Laterality: Right;   PCNL     RETINAL DETACHMENT SURGERY Left 1990   SHUNT REMOVAL     shunt inserted at age 70 23em[place] age 53  [place]  No outpatient medications have been marked as taking for the 07/27/18 encounter (Appointment) with DuRise MuPA-C.    Allergies:   Codeine and Lactose intolerance (gi)   Social History:  The patient  reports that she quit smoking about 31 years ago. Her smoking use included cigarettes. She has a 15.00 pack-year  smoking history. She has never used smokeless tobacco. She reports current alcohol use. She reports that she does not use drugs.   Family History:  The patient's family history includes ADD / ADHD in her maternal grandfather; Birth defects in her daughter and maternal grandfather; Brain cancer in her maternal grandmother; Cancer in her mother; Cirrhosis in her maternal grandfather; Diabetes in her maternal grandfather; Hypertension in her daughter, maternal grandmother, and mother; Uterine cancer in her mother.  ROS:   ROS   PHYSICAL EXAM: *** VS:  There were no vitals taken for this visit. BMI: There is no height or weight on file to calculate BMI.  Physical Exam   EKG:  Was ordered and interpreted by me today. Shows ***  Recent Labs: 01/24/2018:  ALT 11; TSH 0.91 02/04/2018: BUN 11; Creatinine, Ser 0.85; Hemoglobin 13.6; Platelets 287; Potassium 3.5; Sodium 140  01/24/2018: Cholesterol 179; HDL 42; LDL Cholesterol (Calc) 105; Total CHOL/HDL Ratio 4.3; Triglycerides 206   CrCl cannot be calculated (Patient's most recent lab result is older than the maximum 21 days allowed.).   Wt Readings from Last 3 Encounters:  07/21/18 213 lb 3.2 oz (96.7 kg)  06/23/18 215 lb (97.5 kg)  02/04/18 220 lb (99.8 kg)     Other studies reviewed: Additional studies/records reviewed today include: summarized above  ASSESSMENT AND PLAN:  1. ***  Disposition: F/u with Dr. Radford Pax or an APP in ***.  Current medicines are reviewed at length with the patient today.  The patient did not have any concerns regarding medicines.  Signed, Christell Faith, [name] 07/26/2018 12:58 PM     [address] [place], [place] [address] [phone]

## 2018-07-26 ENCOUNTER — Encounter: Payer: Self-pay | Admitting: Physician Assistant

## 2018-07-27 ENCOUNTER — Ambulatory Visit: Payer: Medicare HMO | Admitting: Physician Assistant

## 2018-08-01 ENCOUNTER — Ambulatory Visit: Payer: Self-pay

## 2018-08-01 ENCOUNTER — Other Ambulatory Visit: Payer: Self-pay

## 2018-08-01 DIAGNOSIS — E1140 Type 2 diabetes mellitus with diabetic neuropathy, unspecified: Secondary | ICD-10-CM

## 2018-08-01 DIAGNOSIS — I69359 Hemiplegia and hemiparesis following cerebral infarction affecting unspecified side: Secondary | ICD-10-CM

## 2018-08-01 DIAGNOSIS — IMO0002 Reserved for concepts with insufficient information to code with codable children: Secondary | ICD-10-CM

## 2018-08-01 DIAGNOSIS — E1142 Type 2 diabetes mellitus with diabetic polyneuropathy: Secondary | ICD-10-CM

## 2018-08-01 DIAGNOSIS — H548 Legal blindness, as defined in USA: Secondary | ICD-10-CM

## 2018-08-01 NOTE — Chronic Care Management (AMB) (Signed)
Chronic Care Management   Follow Up Note   08/01/2018 Name: Dannetta Lekas MRN: [id number] DOB: Sep 18, 1965  Referred by: Hubbard Hartshorn, [name] Reason for referral : Chronic Care Management (follow up )   Subjective: "All my doctors said I have to get my blood sugars down"   Objective:  Lab Results  Component Value Date   HGBA1C 12.6 (H) 01/24/2018   HGBA1C 10.6 (A) 07/05/2017   HGBA1C 9.0 (H) 08/27/2016   Lab Results  Component Value Date   MICROALBUR 9.7 01/24/2018   LDLCALC 105 (H) 01/24/2018   CREATININE 0.85 02/04/2018    Assessment: Ms. Harlei Lehrmann. Northrup is a 53 year old female patient of [name], [name] who was referred to the [facility] Team 01/2018 for chronic case management/disease management and care coordination. Ms. Mellott initially engaged with [facility] Pharmacist and [facility] RN CM however she was lost to follow up.   Ms. Mohamed called RN CM requesting to re-engage with team and described need for SW assistance secondary to the need to find local income based housing and transportation. She currently lives with her daughter who is planning on moving out of [place]. Ms. Guagliardo prefers to stay in the area to be close to her medical providers.   Ms Librizzi also admits to her blood sugars being extremely uncontrolled. She request education to better understand how to manage her diabetes as it has now affected multiple body systems   Review of patient status, including review of consultants reports, relevant laboratory and other test results, and collaboration with appropriate care team members and the patient's provider was performed as part of comprehensive patient evaluation and provision of chronic care management services.    Goals    . "I dont drive and my daughter works every day" (pt-stated)     Ms. Doiron currently lives with her daughter who also provides transportation to all of her appointments. Ms. Schnepp states her daughter is considering moving  out of [place]. Ms. Amrein request assistance with income based housing and transportation so she can remain independent in [place] as she does not wish to change health care providers.   Current Barriers:  . Legally blind and unable to receive drivers license  Nurse Case Manager Clinical Goal(s): Over the next 7 days, patient will engage with [place] to discuss transportation resources for [facility]  Interventions:  . Assessed for completion of previously provided [facility] application . Referral to [place]  Please see past updates related to this goal by clicking on the "Past Updates" button in the selected goal      . My blood sugars have been in the 300s and 400s recently (pt-stated)     Ms. Kochanski states her sugars are often in the 300-400s daily. She reports this mornings fasting sugar as 390 and before lunch 400. She states she is taking her medications including insulin as prescribed and trying to eat "healthy". Upon review of her diet, she is following a heart healthy diet by reducing her saturated fats however this morning she had raisin bran cereal with milk and toast (unsure how many serving sizes of cereal she had as she did not measure or read the label for carbohydrates. She states she was having tuna salad for lunch with sweat pickle relish. She IS NOT counting carbohydrates but has cut out her sugar. She is unsure what foods to eat to manage her diabetes.  Current Barriers:  [name] Knowledge  Deficits related to basic Diabetes pathophysiology and self care/management  Nurse Case Manager Clinical Goal(s):  [name] Over the next 14 days, patient will demonstrate improved adherence to prescribed treatment plan for diabetes self care/management as evidenced by checking blood glucose levels as prescribed, adhering to ADA/low carb diet, daily exercise, and medication adherence  Interventions:  . Provided education to patient re: diabetes and carb counting .  Reviewed medications with patient and discussed importance of medication adherence . Discussed plans with patient for ongoing care management follow up and provided patient with direct contact information for care management team . Provided patient with written educational materials related to hypo and hyperglycemia and importance of correct treatment . Advised patient, providing education and rationale, to check cbg per Dr. Ruthy Dick recommendations and call endocrinology office if blood sugars do not respond to hyperglycemia treatment . Discussed complementary hyperglycemia treatment including drinking plenty of water and exercising as much as possible.  Patient Self Care Activities:  . Self administers medications as prescribed . Attends all scheduled provider appointments . Checks blood sugars as prescribed and utilize hyper and hypoglycemia protocol as needed . Adhere to ADA diet as discussed  Plan: RN CM will provided DM educational materials via mail and follow up in 1 week  Initial goal documentation        Telephone follow up appointment with care management team member scheduled for: 1 week     Fayth Trefry E. Rollene Rotunda, RN, BSN Nurse Care Coordinator [facility] Care Management  [phone]

## 2018-08-01 NOTE — Patient Instructions (Addendum)
Thank you allowing the Chronic Care Management Team to be a part of your care! It was a pleasure speaking with you today!  1. Keep taking ALL your medications as prescribed and check your blood sugars as Dr. Pasty Arch has asked you to. 2. I think you are eating WAY TOO MANY Carbs. YOU NEED TO LIMIT YOUR CARBS TO 45-60 GRAMS AT [facility] MEAL AND 15-30 GRAMS FOR AN EVENING SNACK. 3. Have your daughter read the food labels for you and pay close attention to the amount of carbs and serving size 4. You need protein at each meal (meat, eggs, cheese, peanut butter, greek yogurt, nuts). This will help "stabalize" your blood sugars and prevent significant highs and lows. 5. Have your daughter read all the information included in this packet and call me with any questions you may have. 6. I have scheduled an appointment with the social worker, Chrystal Land to discuss your desires to live independently  CCM (Chronic Care Management) Team   Trish Fountain RN, BSN Nurse Care Coordinator  [phone]  Ruben Reason PharmD  Clinical Pharmacist  [phone]   Elliot Gurney, LCSW Clinical Social Worker [phone]  Goals Addressed            This Visit's Progress   . "I dont drive and my daughter works every day" (pt-stated)       Current Barriers:  . Legally blind and unable to receive drivers license  Nurse Case Manager Clinical Goal(s): Over the next 7 days, patient will engage with [place] to discuss transportation resources for [employer]  Interventions:  . Assessed for completion of previously provided [facility] application . Referral to [place]  Please see past updates related to this goal by clicking on the "Past Updates" button in the selected goal      . COMPLETED: "I have a vision difficulty" (pt-stated)       Current Barriers:  . Legally blind  Pharmacist Clinical Goal(s): Over the next 21 days, patient will complete vision assessment with Ernst Breach  in order to receive Talking Label from [place].   Interventions: . Initiated Talking Label program with Janett Billow (RPh) at [place]  Patient Self Care Activities:  . Complete [employer] assessment . Report receipt of [employer] device   *initial goal documentation     . COMPLETED: "I need to apply to Medicaid" (pt-stated)       Current Barriers:  . Financial  . Appointments and referrals for multiple specialists with 45.00 copay  Nurse Case Manager Clinical Goal(s): Over the next 14 days patient will complete application for Medicaid services  Update 03/08/18: Patient has not received Medicaid application in the mail from [place]  Interventions:   Collaborated with [place] DSS to facilitate Medicaid application to be mailed to patients home  Discussed importance of completing application and providing appropriate documents to expedite process.  Provided patient with [place] contact information regarding when and where application was mailed   Patient Self Care Activities:  [name] Gather necessary documents for [facility] Medicaid application  *initial goal documentation      . My blood sugars have been in the 300s and 400s recently (pt-stated)       Current Barriers:  [name] Knowledge Deficits related to basic Diabetes pathophysiology and self care/management  Nurse Case Manager Clinical Goal(s):  [name] Over the next 14 days, patient will demonstrate improved adherence to prescribed treatment plan for diabetes self care/management as  evidenced by checking blood glucose levels as prescribed, adhering to ADA/low carb diet, daily exercise, and medication adherence  Interventions:  . Provided education to patient re: diabetes and carb counting . Reviewed medications with patient and discussed importance of medication adherence . Discussed plans with patient for ongoing care management follow up and provided patient with direct contact information for care  management team . Provided patient with written educational materials related to hypo and hyperglycemia and importance of correct treatment . Reviewed scheduled/upcoming provider appointments including:  . Advised patient, providing education and rationale, to check cbg per Dr. Ruthy Dick recommendations and call endocrinology office if blood sugars do not respond to hyperglycemia treatment  Patient Self Care Activities:  . Self administers medications as prescribed . Attends all scheduled provider appointments . Checks blood sugars as prescribed and utilize hyper and hypoglycemia protocol as needed . Adhere to ADA diet as discussed  Plan: RN CM will provided DM educational materials via mail and follow up in 1 week  Initial goal documentation         Print copy of patient instructions provided.   Telephone follow up appointment with care management team member scheduled for: 1 week  SYMPTOMS OF A STROKE   You have any symptoms of stroke. "BE FAST" is an easy way to remember the main warning signs: ? B - Balance. Signs are dizziness, sudden trouble walking, or loss of balance. ? E - Eyes. Signs are trouble seeing or a sudden change in how you see. ? F - Face. Signs are sudden weakness or loss of feeling of the face, or the face or eyelid drooping on one side. ? A - Arms. Signs are weakness or loss of feeling in an arm. This happens suddenly and usually on one side of the body. ? S - Speech. Signs are sudden trouble speaking, slurred speech, or trouble understanding what people say. ? T - Time. Time to call emergency services. Write down what time symptoms started.  You have other signs of stroke, such as: ? A sudden, very bad headache with no known cause. ? Feeling sick to your stomach (nausea). ? Throwing up (vomiting). ? Jerky movements you cannot control (seizure).  SYMPTOMS OF A HEART ATTACK  What are the signs or symptoms? Symptoms of this condition include:  Chest  pain. It may feel like: ? Crushing or squeezing. ? Tightness, pressure, fullness, or heaviness.  Pain in the arm, neck, jaw, back, or upper body.  Shortness of breath.  Heartburn.  Indigestion.  Nausea.  Cold sweats.  Feeling tired.  Sudden lightheadedness.  Carbohydrate Counting for Diabetes Mellitus, Adult  Carbohydrate counting is a method of keeping track of how many carbohydrates you eat. Eating carbohydrates naturally increases the amount of sugar (glucose) in the blood. Counting how many carbohydrates you eat helps keep your blood glucose within normal limits, which helps you manage your diabetes (diabetes mellitus). It is important to know how many carbohydrates you can safely have in each meal. This is different for every person. A diet and nutrition specialist (registered dietitian) can help you make a meal plan and calculate how many carbohydrates you should have at each meal and snack. Carbohydrates are found in the following foods:  Grains, such as breads and cereals.  Dried beans and soy products.  Starchy vegetables, such as potatoes, peas, and corn.  Fruit and fruit juices.  Milk and yogurt.  Sweets and snack foods, such as cake, cookies, candy, chips, and soft drinks.  How do I count carbohydrates? There are two ways to count carbohydrates in food. You can use either of the methods or a combination of both. Reading "Nutrition Facts" on packaged food The "Nutrition Facts" list is included on the labels of almost all packaged foods and beverages in the [place] It includes:  The serving size.  Information about nutrients in each serving, including the grams (g) of carbohydrate per serving. To use the "Nutrition Facts":  Decide how many servings you will have.  Multiply the number of servings by the number of carbohydrates per serving.  The resulting number is the total amount of carbohydrates that you will be having. Learning standard serving sizes of  other foods When you eat carbohydrate foods that are not packaged or do not include "Nutrition Facts" on the label, you need to measure the servings in order to count the amount of carbohydrates:  Measure the foods that you will eat with a food scale or measuring cup, if needed.  Decide how many standard-size servings you will eat.  Multiply the number of servings by 15. Most carbohydrate-rich foods have about 15 g of carbohydrates per serving. ? For example, if you eat 8 oz (170 g) of strawberries, you will have eaten 2 servings and 30 g of carbohydrates (2 servings x 15 g = 30 g).  For foods that have more than one food mixed, such as soups and casseroles, you must count the carbohydrates in each food that is included. The following list contains standard serving sizes of common carbohydrate-rich foods. Each of these servings has about 15 g of carbohydrates:   hamburger bun or  English muffin.   oz (15 mL) syrup.   oz (14 g) jelly.  1 slice of bread.  1 six-inch tortilla.  3 oz (85 g) cooked rice or pasta.  4 oz (113 g) cooked dried beans.  4 oz (113 g) starchy vegetable, such as peas, corn, or potatoes.  4 oz (113 g) hot cereal.  4 oz (113 g) mashed potatoes or  of a large baked potato.  4 oz (113 g) canned or frozen fruit.  4 oz (120 mL) fruit juice.  4-6 crackers.  6 chicken nuggets.  6 oz (170 g) unsweetened dry cereal.  6 oz (170 g) plain fat-free yogurt or yogurt sweetened with artificial sweeteners.  8 oz (240 mL) milk.  8 oz (170 g) fresh fruit or one small piece of fruit.  24 oz (680 g) popped popcorn. Example of carbohydrate counting Sample meal  3 oz (85 g) chicken breast.  6 oz (170 g) brown rice.  4 oz (113 g) corn.  8 oz (240 mL) milk.  8 oz (170 g) strawberries with sugar-free whipped topping. Carbohydrate calculation 1. Identify the foods that contain carbohydrates: ? Rice. ? Corn. ? Milk. ? Strawberries. 2. Calculate how many  servings you have of each food: ? 2 servings rice. ? 1 serving corn. ? 1 serving milk. ? 1 serving strawberries. 3. Multiply each number of servings by 15 g: ? 2 servings rice x 15 g = 30 g. ? 1 serving corn x 15 g = 15 g. ? 1 serving milk x 15 g = 15 g. ? 1 serving strawberries x 15 g = 15 g. 4. Add together all of the amounts to find the total grams of carbohydrates eaten: ? 30 g + 15 g + 15 g + 15 g = 75 g of carbohydrates total. Summary  Carbohydrate counting is  a method of keeping track of how many carbohydrates you eat.  Eating carbohydrates naturally increases the amount of sugar (glucose) in the blood.  Counting how many carbohydrates you eat helps keep your blood glucose within normal limits, which helps you manage your diabetes.  A diet and nutrition specialist (registered dietitian) can help you make a meal plan and calculate how many carbohydrates you should have at each meal and snack. This information is not intended to replace advice given to you by your health care provider. Make sure you discuss any questions you have with your health care provider. Document Released: 01/05/2005 Document Revised: 07/30/2016 Document Reviewed: 06/19/2015 Elsevier Patient Education  2020 [employer].

## 2018-08-02 ENCOUNTER — Ambulatory Visit: Payer: Self-pay | Admitting: *Deleted

## 2018-08-02 DIAGNOSIS — R0602 Shortness of breath: Secondary | ICD-10-CM | POA: Diagnosis not present

## 2018-08-02 DIAGNOSIS — E1142 Type 2 diabetes mellitus with diabetic polyneuropathy: Secondary | ICD-10-CM

## 2018-08-02 DIAGNOSIS — Z8673 Personal history of transient ischemic attack (TIA), and cerebral infarction without residual deficits: Secondary | ICD-10-CM | POA: Diagnosis not present

## 2018-08-02 DIAGNOSIS — H548 Legal blindness, as defined in USA: Secondary | ICD-10-CM

## 2018-08-02 DIAGNOSIS — E1165 Type 2 diabetes mellitus with hyperglycemia: Secondary | ICD-10-CM | POA: Diagnosis not present

## 2018-08-02 DIAGNOSIS — I10 Essential (primary) hypertension: Secondary | ICD-10-CM | POA: Diagnosis not present

## 2018-08-02 DIAGNOSIS — E782 Mixed hyperlipidemia: Secondary | ICD-10-CM | POA: Diagnosis not present

## 2018-08-02 DIAGNOSIS — I208 Other forms of angina pectoris: Secondary | ICD-10-CM | POA: Diagnosis not present

## 2018-08-02 DIAGNOSIS — E669 Obesity, unspecified: Secondary | ICD-10-CM | POA: Diagnosis not present

## 2018-08-02 DIAGNOSIS — Z87898 Personal history of other specified conditions: Secondary | ICD-10-CM | POA: Diagnosis not present

## 2018-08-02 DIAGNOSIS — R002 Palpitations: Secondary | ICD-10-CM | POA: Diagnosis not present

## 2018-08-02 NOTE — Patient Instructions (Signed)
Thank you allowing the Chronic Care Management Team to be a part of your care! It was a pleasure speaking with you today!  1. Please call the CCM social worker with any questions regarding your housing needs.   CCM (Chronic Care Management) Team   Trish Fountain RN, BSN Nurse Care Coordinator  [phone]  Ruben Reason PharmD  Clinical Pharmacist  [phone]   Elliot Gurney, LCSW Clinical Social Worker [phone]  Goals Addressed            This Visit's Progress   . "I would like my own place" (pt-stated)       Current Barriers:  . Financial constraints . Housing barriers  Clinical Social Work Clinical Goal(s):  [name] Over the next 90 days, client will work with SW to address concerns related to available affordable housing options  Interventions: . Patient interviewed and appropriate assessments performed . Provided patient with information about affordable housing options in [facility], including [address] and [employer] . Discussed plan to send patient available rental applications to her home to complete as well as a list of affordable housing options to contact for availability . Advised patient to complete and return rental applications when completed. . Advised patient to review affordable housing list when received for possible availabilities  Patient Self Care Activities:  . Performs ADL's independently . Performs IADL's independently . Calls provider office for new concerns or questions  Initial goal documentation     . Patient Stated          The patient verbalized understanding of instructions provided today and declined a print copy of patient instruction materials.   Telephone follow up appointment with care management team member scheduled for:08/16/18

## 2018-08-02 NOTE — Chronic Care Management (AMB) (Signed)
.  ccm  Chronic Care Management    Clinical Social Work General Follow Up Note  08/02/2018 Name: Gale Klar MRN: [id number] DOB: [birth year]  Corinda Ammon is a 53 y.o. year old female who is a primary care patient of Hubbard Hartshorn, [name]. The CCM team was consulted for assistance with Community Resources related to housing.   Review of patient status, including review of consultants reports, relevant laboratory and other test results, and collaboration with appropriate care team members and the patient's provider was performed as part of comprehensive patient evaluation and provision of chronic care management services.    Goals Addressed            This Visit's Progress   . "I would like my own place" (pt-stated)       Current Barriers:  . Financial constraints . Housing barriers  Clinical Social Work Clinical Goal(s):  [name] Over the next 90 days, client will work with SW to address concerns related to available affordable housing options  Interventions: . Patient interviewed and appropriate assessments performed . Provided patient with information about affordable housing options in [facility], including [address] and [employer] . Discussed plan to send patient available rental applications to her home to complete as well as a list of affordable housing options to contact for availability . Advised patient to complete and return rental applications when completed. . Advised patient to review affordable housing list when received for possible availabilities  Patient Self Care Activities:  . Performs ADL's independently . Performs IADL's independently . Calls provider office for new concerns or questions  Initial goal documentation     . Patient Stated           Follow Up Plan: SW will follow up with patient by phone over the next 2 weeks regarding housing resources received    Elliot Gurney, [place] Worker  [facility] Care Management [phone]

## 2018-08-03 ENCOUNTER — Other Ambulatory Visit: Payer: Self-pay

## 2018-08-03 ENCOUNTER — Ambulatory Visit: Payer: Medicare HMO

## 2018-08-03 ENCOUNTER — Ambulatory Visit
Admission: RE | Admit: 2018-08-03 | Discharge: 2018-08-03 | Disposition: A | Discharge status: Home / Self Care | Payer: Medicare HMO | Source: Ambulatory Visit | Attending: Acute Care | Admitting: Acute Care

## 2018-08-03 DIAGNOSIS — I639 Cerebral infarction, unspecified: Secondary | ICD-10-CM | POA: Insufficient documentation

## 2018-08-03 DIAGNOSIS — G319 Degenerative disease of nervous system, unspecified: Secondary | ICD-10-CM | POA: Diagnosis not present

## 2018-08-04 DIAGNOSIS — J449 Chronic obstructive pulmonary disease, unspecified: Secondary | ICD-10-CM | POA: Diagnosis not present

## 2018-08-08 ENCOUNTER — Telehealth: Payer: Self-pay

## 2018-08-08 ENCOUNTER — Ambulatory Visit: Payer: Self-pay | Admitting: *Deleted

## 2018-08-08 DIAGNOSIS — E1140 Type 2 diabetes mellitus with diabetic neuropathy, unspecified: Secondary | ICD-10-CM

## 2018-08-08 DIAGNOSIS — IMO0002 Reserved for concepts with insufficient information to code with codable children: Secondary | ICD-10-CM

## 2018-08-08 DIAGNOSIS — H548 Legal blindness, as defined in USA: Secondary | ICD-10-CM

## 2018-08-08 NOTE — Chronic Care Management (AMB) (Signed)
  Chronic Care Management    Clinical Social Work Follow Up Note  08/08/2018 Name: [name] MRN: [id number] DOB: 03/29/1965  [name] is a 53 y.o. year old female who is a primary care patient of Hubbard Hartshorn, [name]. The CCM team was consulted for assistance with [employer] for housing.   Review of patient status, including review of consultants reports, other relevant assessments, and collaboration with appropriate care team members and the patient's provider was performed as part of comprehensive patient evaluation and provision of chronic care management services.     Goals Addressed            This Visit's Progress   . "I would like my own place" (pt-stated)       Current Barriers:  . Financial constraints . Housing barriers  Clinical Social Work Clinical Goal(s):  [name] Over the next 90 days, client will work with SW to address concerns related to available affordable housing options  Interventions: . Patient interviewed and appropriate assessments performed . Confirmed with patient that information about affordable housing options in [facility], including [address] and [employer] has been mailed to her home. [name] Confirmed that available rental applications have been sent to her home to complete including a list of affordable housing options to contact for availability . Advised patient to complete and return rental applications when completed. . Advised patient to review affordable housing list when received for possible availabilities  Patient Self Care Activities:  . Performs ADL's independently . Performs IADL's independently . Calls provider office for new concerns or questions  Please see past updates related to this goal by clicking on the "Past Updates" button in the selected goal          Follow Up Plan: SW will follow up with patient by phone over the next 2 weeks to follow up on referals mailed to her home   [place], [place] Worker  [facility] Care Management [phone]

## 2018-08-08 NOTE — Patient Instructions (Signed)
Thank you allowing the Chronic Care Management Team to be a part of your care! It was a pleasure speaking with you today!  1. Please review housing materials once received and call this CCM social worker with any questions in regards to completing the application enclosed to [employer] affordable apartment.  CCM (Chronic Care Management) Team   Trish Fountain RN, BSN Nurse Care Coordinator  [phone]  Ruben Reason PharmD  Clinical Pharmacist  [phone]   Elliot Gurney, LCSW Clinical Social Worker [phone]  Goals Addressed            This Visit's Progress   . "I would like my own place" (pt-stated)       Current Barriers:  . Financial constraints . Housing barriers  Clinical Social Work Clinical Goal(s):  [name] Over the next 90 days, client will work with SW to address concerns related to available affordable housing options  Interventions: . Patient interviewed and appropriate assessments performed . Confirmed with patient that information about affordable housing options in [facility], including [address] 8 and [employer] has been mailed to her home. [name] Confirmed that available rental applications have been sent to her home to complete including a list of affordable housing options to contact for availability . Advised patient to complete and return rental applications when completed. . Advised patient to review affordable housing list when received for possible availabilities  Patient Self Care Activities:  . Performs ADL's independently . Performs IADL's independently . Calls provider office for new concerns or questions  Please see past updates related to this goal by clicking on the "Past Updates" button in the selected goal          The patient verbalized understanding of instructions provided today and declined a print copy of patient instruction materials.   The care management team will reach out to the patient again over the next 14  days.

## 2018-08-09 ENCOUNTER — Other Ambulatory Visit: Payer: Self-pay | Admitting: Family Medicine

## 2018-08-09 ENCOUNTER — Ambulatory Visit: Payer: Medicare HMO | Admitting: Family Medicine

## 2018-08-09 DIAGNOSIS — E876 Hypokalemia: Secondary | ICD-10-CM

## 2018-08-09 MED ORDER — POTASSIUM CHLORIDE CRYS ER 20 MEQ PO TBCR: 40.0000 meq | EXTENDED_RELEASE_TABLET | Freq: Every day | ORAL | 0 refills | Status: DC

## 2018-08-10 ENCOUNTER — Ambulatory Visit: Payer: Self-pay

## 2018-08-10 ENCOUNTER — Other Ambulatory Visit: Payer: Self-pay

## 2018-08-10 DIAGNOSIS — IMO0002 Reserved for concepts with insufficient information to code with codable children: Secondary | ICD-10-CM

## 2018-08-10 DIAGNOSIS — E1165 Type 2 diabetes mellitus with hyperglycemia: Secondary | ICD-10-CM | POA: Diagnosis not present

## 2018-08-10 DIAGNOSIS — E1142 Type 2 diabetes mellitus with diabetic polyneuropathy: Secondary | ICD-10-CM | POA: Diagnosis not present

## 2018-08-10 DIAGNOSIS — I69359 Hemiplegia and hemiparesis following cerebral infarction affecting unspecified side: Secondary | ICD-10-CM

## 2018-08-10 DIAGNOSIS — H548 Legal blindness, as defined in USA: Secondary | ICD-10-CM

## 2018-08-10 DIAGNOSIS — E1140 Type 2 diabetes mellitus with diabetic neuropathy, unspecified: Secondary | ICD-10-CM

## 2018-08-10 DIAGNOSIS — Z794 Long term (current) use of insulin: Secondary | ICD-10-CM | POA: Diagnosis not present

## 2018-08-10 DIAGNOSIS — I10 Essential (primary) hypertension: Secondary | ICD-10-CM | POA: Diagnosis not present

## 2018-08-10 DIAGNOSIS — E669 Obesity, unspecified: Secondary | ICD-10-CM | POA: Diagnosis not present

## 2018-08-10 DIAGNOSIS — E782 Mixed hyperlipidemia: Secondary | ICD-10-CM | POA: Diagnosis not present

## 2018-08-10 NOTE — Chronic Care Management (AMB) (Signed)
Chronic Care Management   Follow Up Note   08/10/2018 Name: [name] MRN: [id number] DOB: 1965/09/17  Referred by: Hubbard Hartshorn, [name] Reason for referral : Chronic Care Management (follow up DM self care)   Subjective: "My sugars are still in the high 200s so Dr. Philipp Ovens increased my mealtime sliding scale today"   Objective:  Lab Results  Component Value Date   HGBA1C 12.6 (H) 01/24/2018   HGBA1C 10.6 (A) 07/05/2017   HGBA1C 9.0 (H) 08/27/2016   Lab Results  Component Value Date   MICROALBUR 9.7 01/24/2018   LDLCALC 105 (H) 01/24/2018   CREATININE 0.85 02/04/2018    Assessment: Karen. [name] is a 53 year old female patient of Karen Ensign, [name] who was referred to the [facility] Team 01/2018 for chronic case management/disease management and care coordination. Karen. Carp initially engaged with [facility] Pharmacist and [facility] Clinic RN CM however she was lost to follow up.   [name] called RN CM requesting to re-engage with team and described need for SW assistance secondary to the need to find local income based housing and transportation. She currently lives with her [name] who is planning on moving out of [place]. [name] prefers to stay in the area to be close to her medical providers.   [name] also admits to her blood sugars being extremely uncontrolled. She was recently provided verbal and written education on diabetes and diabetes self care management. Today RN CM followed up with Karen. Steinfeldt to assess for progression towards goals.  Review of patient status, including review of consultants reports, relevant laboratory and other test results, and collaboration with appropriate care team members and the patient's provider was performed as part of comprehensive patient evaluation and provision of chronic care management services.    Goals Addressed            This Visit's Progress   . My blood sugars have been in the 300s and 400s  recently (pt-stated)       [name] and [name] admit to receipt of educational materials via mail. [name] states her family read her the materials and all found it very helpful. [name] and [name] are assisting patient with meal choices and counting carbohydrates. Reports fasting blood sugar 207 this am and 276 prior to lunch. She did not eat breakfast today secondary to being rushed for endocrinology appointment. She reports having a grilled chicken salad with [place] dressing for dinner last night and she drank water. She states she is taking her mediations as directed however I am concerned with her poor vision she is not able to dial her insulin correctly. Of note, she did receive pill bottle opener and magnifying glass today in the mail (provided by her eye doctor). Family to assist with filling pill box weekly.   Current Barriers:  [name] Knowledge Deficits related to basic Diabetes pathophysiology and self care/management  Nurse Case Manager Clinical Goal(s):  [name] Over the next 14 days, patient will demonstrate improved adherence to prescribed treatment plan for diabetes self care/management as evidenced by checking blood glucose levels as prescribed, adhering to ADA/low carb diet, daily exercise, and medication adherence-progressing 08/10/2018  Interventions:  . Reviewed blood sugar log . Discussed importance of carb counting with patient and [name] . Strongly encouraged patient to exercise/walk daily and discussed how this can lower blood sugars . Assessed for receipt of previously provided DM education via mail . Reviewed scheduled/upcoming provider appointments including:  08/12/2018 PCP, 08/15/2018 urology . Advised patient, providing education and rationale, to check cbg per [facility] recommendations and call endocrinology office if blood sugars do not respond to hyperglycemia treatment  Patient Self Care Activities:  . Self administers medications as prescribed .  Attends all scheduled provider appointments . Checks blood sugars as prescribed and utilize hyper and hypoglycemia protocol as needed . Adhere to ADA diet as discussed  Plan: Will follow up in 2 weeks  Please see past updates related to this goal by clicking on the "Past Updates" button in the selected goal           Telephone follow up appointment with care management team member scheduled for: 2 weeks     [name] E. Rollene Rotunda, RN, BSN Nurse Care Coordinator [facility] Care Management  [phone]

## 2018-08-10 NOTE — Patient Instructions (Addendum)
Thank you allowing the Chronic Care Management Team to be a part of your care! It was a pleasure speaking with you today!  1. Please continue to take ALL your medications as prescribed including your increased sliding scale insulin discussed today during visit with Malissa Hippo, NP 2. Continue to check your sugars before meals and before bed 3. YOU SHOULD NOT SKIP MEALS. SKIPPING MEALS WILL NOT LOWER YOUR BLOOD SUGARS 4. Please continue to monitor your intake of carbohydrates, not just sugar. Remember you can have 45-60 grams or carbs at each meal and an evening snack of 15-30 grams. You can find out the amount of carbs in foods by reading food labels. 5. You must remain active during the day Ms. Bogden. Daily exercise/activity will lower your sugars. Exercise will also decrease your risk for another stroke or heart attack. Please try to get 30 min of exercise that raises your heart rate, daily.  CCM (Chronic Care Management) Team   Trish Fountain RN, BSN Nurse Care Coordinator  [phone]  Ruben Reason PharmD  Clinical Pharmacist  [phone]   Elliot Gurney, LCSW Clinical Social Worker [phone]  Goals Addressed            This Visit's Progress   . My blood sugars have been in the 300s and 400s recently (pt-stated)       Current Barriers:  [name] Knowledge Deficits related to basic Diabetes pathophysiology and self care/management  Nurse Case Manager Clinical Goal(s):  [name] Over the next 14 days, patient will demonstrate improved adherence to prescribed treatment plan for diabetes self care/management as evidenced by checking blood glucose levels as prescribed, adhering to ADA/low carb diet, daily exercise, and medication adherence-progressing 08/10/2018  Interventions:  . Reviewed blood sugar log . Discussed importance of carb counting with patient and daughter . Strongly encouraged patient to exercise/walk daily and discussed how this can lower blood  sugars . Assessed for receipt of previously provided DM education via mail . Reviewed scheduled/upcoming provider appointments including: 08/12/2018 PCP, 08/15/2018 urology . Advised patient, providing education and rationale, to check cbg per Dr. Ruthy Dick recommendations and call endocrinology office if blood sugars do not respond to hyperglycemia treatment  Patient Self Care Activities:  . Self administers medications as prescribed . Attends all scheduled provider appointments . Checks blood sugars as prescribed and utilize hyper and hypoglycemia protocol as needed . Adhere to ADA diet as discussed  Plan: Will follow up in 2 weeks  Please see past updates related to this goal by clicking on the "Past Updates" button in the selected goal          The patient verbalized understanding of instructions provided today and declined a print copy of patient instruction materials.   Telephone follow up appointment with care management team member scheduled for: 2 weeks  SYMPTOMS OF A STROKE   You have any symptoms of stroke. "BE FAST" is an easy way to remember the main warning signs: ? B - Balance. Signs are dizziness, sudden trouble walking, or loss of balance. ? E - Eyes. Signs are trouble seeing or a sudden change in how you see. ? F - Face. Signs are sudden weakness or loss of feeling of the face, or the face or eyelid drooping on one side. ? A - Arms. Signs are weakness or loss of feeling in an arm. This happens suddenly and usually on one side of the body. ? S - Speech. Signs are sudden trouble speaking, slurred speech,  or trouble understanding what people say. ? T - Time. Time to call emergency services. Write down what time symptoms started.  You have other signs of stroke, such as: ? A sudden, very bad headache with no known cause. ? Feeling sick to your stomach (nausea). ? Throwing up (vomiting). ? Jerky movements you cannot control (seizure).  SYMPTOMS OF A HEART  ATTACK  What are the signs or symptoms? Symptoms of this condition include:  Chest pain. It may feel like: ? Crushing or squeezing. ? Tightness, pressure, fullness, or heaviness.  Pain in the arm, neck, jaw, back, or upper body.  Shortness of breath.  Heartburn.  Indigestion.  Nausea.  Cold sweats.  Feeling tired.  Sudden lightheadedness.

## 2018-08-11 DIAGNOSIS — I208 Other forms of angina pectoris: Secondary | ICD-10-CM | POA: Diagnosis not present

## 2018-08-11 DIAGNOSIS — R0602 Shortness of breath: Secondary | ICD-10-CM | POA: Diagnosis not present

## 2018-08-12 ENCOUNTER — Ambulatory Visit: Payer: Medicare HMO | Admitting: Family Medicine

## 2018-08-12 DIAGNOSIS — R569 Unspecified convulsions: Secondary | ICD-10-CM | POA: Diagnosis not present

## 2018-08-15 ENCOUNTER — Encounter: Payer: Self-pay | Admitting: Urology

## 2018-08-15 ENCOUNTER — Ambulatory Visit: Payer: Medicare HMO | Admitting: Urology

## 2018-08-16 ENCOUNTER — Ambulatory Visit: Payer: Self-pay | Admitting: *Deleted

## 2018-08-16 DIAGNOSIS — H548 Legal blindness, as defined in USA: Secondary | ICD-10-CM

## 2018-08-16 DIAGNOSIS — E1140 Type 2 diabetes mellitus with diabetic neuropathy, unspecified: Secondary | ICD-10-CM

## 2018-08-16 DIAGNOSIS — IMO0002 Reserved for concepts with insufficient information to code with codable children: Secondary | ICD-10-CM

## 2018-08-16 NOTE — Patient Instructions (Signed)
Thank you allowing the Chronic Care Management Team to be a part of your care! It was a pleasure speaking with you today!  1. Please call this social worker with any community resource needs in the future  CCM (Chronic Care Management) Team   Trish Fountain RN, BSN Nurse Care Coordinator  [phone]  Ruben Reason PharmD  Clinical Pharmacist  [phone]   Elliot Gurney, LCSW Clinical Social Worker [phone]  Goals Addressed            This Visit's Progress   . "I would like my own place" completed (pt-stated)       Current Barriers:  . Financial constraints . Housing barriers  Clinical Social Work Clinical Goal(s):  [name] Over the next 90 days, client will work with SW to address concerns related to available affordable housing options  Interventions: . Patient interviewed and appropriate assessments performed . Confirmed with patient that information about affordable housing resources mailed to her home were received . Confirmed that patient will be remaining with family at this time and has postponed plans to move . Advised patient to save housing resources provided for use in the future if needed . Advised patient to call this social worker if assistance is needed in the future  Patient Self Care Activities:  . Performs ADL's independently . Performs IADL's independently . Calls provider office for new concerns or questions  Please see past updates related to this goal by clicking on the "Past Updates" button in the selected goal          The patient verbalized understanding of instructions provided today and declined a print copy of patient instruction materials.   No further follow up required: patient to call with any additional community resource needs in the future

## 2018-08-16 NOTE — Chronic Care Management (AMB) (Signed)
  Chronic Care Management    Clinical Social Work Follow Up Note  08/16/2018 Name: [name] MRN: [id number] DOB: October 10, 1965  [name] is a 53 y.o. year old female who is a primary care patient of Hubbard Hartshorn, [name]. The CCM team was consulted for assistance with [employer] for housing.   Review of patient status, including review of consultants reports, other relevant assessments, and collaboration with appropriate care team members and the patient's provider was performed as part of comprehensive patient evaluation and provision of chronic care management services.     Goals Addressed            This Visit's Progress   . "I would like my own place" (pt-stated)       Current Barriers:  . Financial constraints . Housing barriers  Clinical Social Work Clinical Goal(s):  [name] Over the next 90 days, client will work with SW to address concerns related to available affordable housing options  Interventions: . Patient interviewed and appropriate assessments performed . Confirmed with patient that information about affordable housing resources mailed to her home were received . Confirmed that patient will be remaining with family at this time and has postponed plans to move . Advised patient to save housing resources provided for use in the future if needed . Advised patient to call this social worker if assistance is needed in the future  Patient Self Care Activities:  . Performs ADL's independently . Performs IADL's independently . Calls provider office for new concerns or questions  Please see past updates related to this goal by clicking on the "Past Updates" button in the selected goal          Follow Up Plan: Client will call this social worker with any community resource needs in the future   [place], [place] Worker  [facility] Care Management [phone]

## 2018-08-23 ENCOUNTER — Telehealth: Payer: Self-pay

## 2018-08-24 DIAGNOSIS — E669 Obesity, unspecified: Secondary | ICD-10-CM | POA: Diagnosis not present

## 2018-08-24 DIAGNOSIS — R002 Palpitations: Secondary | ICD-10-CM | POA: Diagnosis not present

## 2018-08-24 DIAGNOSIS — Z8673 Personal history of transient ischemic attack (TIA), and cerebral infarction without residual deficits: Secondary | ICD-10-CM | POA: Diagnosis not present

## 2018-08-24 DIAGNOSIS — Z87898 Personal history of other specified conditions: Secondary | ICD-10-CM | POA: Diagnosis not present

## 2018-08-24 DIAGNOSIS — E1165 Type 2 diabetes mellitus with hyperglycemia: Secondary | ICD-10-CM | POA: Diagnosis not present

## 2018-08-24 DIAGNOSIS — E782 Mixed hyperlipidemia: Secondary | ICD-10-CM | POA: Diagnosis not present

## 2018-08-24 DIAGNOSIS — I208 Other forms of angina pectoris: Secondary | ICD-10-CM | POA: Diagnosis not present

## 2018-08-24 DIAGNOSIS — I10 Essential (primary) hypertension: Secondary | ICD-10-CM | POA: Diagnosis not present

## 2018-08-24 DIAGNOSIS — R0602 Shortness of breath: Secondary | ICD-10-CM | POA: Diagnosis not present

## 2018-09-04 DIAGNOSIS — J449 Chronic obstructive pulmonary disease, unspecified: Secondary | ICD-10-CM | POA: Diagnosis not present

## 2018-09-12 ENCOUNTER — Ambulatory Visit: Payer: Self-pay

## 2018-09-12 DIAGNOSIS — IMO0002 Reserved for concepts with insufficient information to code with codable children: Secondary | ICD-10-CM

## 2018-09-12 DIAGNOSIS — E1140 Type 2 diabetes mellitus with diabetic neuropathy, unspecified: Secondary | ICD-10-CM

## 2018-09-12 NOTE — Patient Instructions (Addendum)
Thank you allowing the Chronic Care Management Team to be a part of your care! It was a pleasure speaking with you today!   Please continue to follow your diabetes plan of care provided by Dr. Honor Junes and Ms. Blackwood (check sugars before each meal and 2 hours after evening meal, take medications as prescribed including your sliding scale insulin, exercise 4-5 days a week, follow a low carbohydrate, cut your portion sizes, limit fatty foods)   CCM (Chronic Care Management) Team   Trish Fountain RN, BSN Nurse Care Coordinator  [phone]  Ruben Reason PharmD  Clinical Pharmacist  [phone]   [place], Annetta Social Worker [phone]  Goals Addressed            This Visit's Progress   . COMPLETED: "I dont drive and my daughter works every day" (pt-stated)       Current Barriers:  . Legally blind and unable to receive drivers license  Nurse Case Manager Clinical Goal(s): Over the next 7 days, patient will engage with [place] to discuss transportation resources for [employer]  Interventions:  . Assessed for completion of previously provided [facility] application . Referral to [place]  Please see past updates related to this goal by clicking on the "Past Updates" button in the selected goal      . COMPLETED: My blood sugars have been in the 300s and 400s recently (pt-stated)       Current Barriers:  [name] Knowledge Deficits related to basic Diabetes pathophysiology and self care/management  Nurse Case Manager Clinical Goal(s):  [name] Over the next 14 days, patient will demonstrate improved adherence to prescribed treatment plan for diabetes self care/management as evidenced by checking blood glucose levels as prescribed, adhering to ADA/low carb diet, daily exercise, and medication adherence-progressing 08/10/2018  Interventions:  . Reviewed blood sugar log . Discussed importance of carb counting with patient and daughter . Strongly  encouraged patient to exercise/walk daily and discussed how this can lower blood sugars . Assessed for receipt of previously provided DM education via mail . Reviewed scheduled/upcoming provider appointments including: 08/12/2018 PCP, 08/15/2018 urology . Advised patient, providing education and rationale, to check cbg per [facility] recommendations and call endocrinology office if blood sugars do not respond to hyperglycemia treatment  Patient Self Care Activities:  . Self administers medications as prescribed . Attends all scheduled provider appointments . Checks blood sugars as prescribed and utilize hyper and hypoglycemia protocol as needed . Adhere to ADA diet as discussed  Plan: Will follow up in 2 weeks  Please see past updates related to this goal by clicking on the "Past Updates" button in the selected goal          The patient verbalized understanding of instructions provided today and declined a print copy of patient instruction materials.   The care management team will reach out to the patient again over the next 60 days.  The patient has been provided with contact information for the care management team and has been advised to call with any health related questions or concerns.   SYMPTOMS OF A STROKE   You have any symptoms of stroke. "BE FAST" is an easy way to remember the main warning signs: ? B - Balance. Signs are dizziness, sudden trouble walking, or loss of balance. ? E - Eyes. Signs are trouble seeing or a sudden change in how you see. ? F - Face. Signs are sudden weakness or loss of  feeling of the face, or the face or eyelid drooping on one side. ? A - Arms. Signs are weakness or loss of feeling in an arm. This happens suddenly and usually on one side of the body. ? S - Speech. Signs are sudden trouble speaking, slurred speech, or trouble understanding what people say. ? T - Time. Time to call emergency services. Write down what time symptoms  started.  You have other signs of stroke, such as: ? A sudden, very bad headache with no known cause. ? Feeling sick to your stomach (nausea). ? Throwing up (vomiting). ? Jerky movements you cannot control (seizure).  SYMPTOMS OF A HEART ATTACK  What are the signs or symptoms? Symptoms of this condition include:  Chest pain. It may feel like: ? Crushing or squeezing. ? Tightness, pressure, fullness, or heaviness.  Pain in the arm, neck, jaw, back, or upper body.  Shortness of breath.  Heartburn.  Indigestion.  Nausea.  Cold sweats.  Feeling tired.  Sudden lightheadedness.

## 2018-09-12 NOTE — Chronic Care Management (AMB) (Signed)
Chronic Care Management   Follow Up Note   09/12/2018 Name: [name] MRN: [id number] DOB: February 22, 1965  Referred by: Hubbard Hartshorn, [name] Reason for referral : Chronic Care Management (follow up DM)   Subjective: "My sugars are real good now"   Objective:  Lab Results  Component Value Date   HGBA1C 12.6 (H) 01/24/2018   HGBA1C 10.6 (A) 07/05/2017   HGBA1C 9.0 (H) 08/27/2016   Lab Results  Component Value Date   MICROALBUR 9.7 01/24/2018   LDLCALC 105 (H) 01/24/2018   CREATININE 0.85 02/04/2018    Assessment: Ms. Jonaya Freshour. Camero is a 53 year old female patient of [name], [name] who was referred to the [facility] Team 01/2018 for chronic case management/disease management and care coordination  Review of patient status, including review of consultants reports, relevant laboratory and other test results, and collaboration with appropriate care team members and the patient's provider was performed as part of comprehensive patient evaluation and provision of chronic care management services.    Goals Addressed            This Visit's Progress   . COMPLETED: "I dont drive and my daughter works every day" (pt-stated)       Current Barriers:  . Legally blind and unable to receive drivers license  Nurse Case Manager Clinical Goal(s): Over the next 7 days, patient will engage with [place] to discuss transportation resources for [employer]  Interventions:  . Assessed for completion of previously provided [facility] application . Referral to [place]  Please see past updates related to this goal by clicking on the "Past Updates" button in the selected goal      . My blood sugars have been in the 300s and 400s recently (pt-stated)        Ms. Khalid states she is doing much better since out last encounter. She reports highest sugar as 150 and lowest as 118. She is following an ADA, low carb diet with assistance from her daughter and other family members. She  continues to live with her daughter and realizes that she is currently not able to live independently at this time which was originally a goal. She has a follow up appointment with endocrinologist 09/29/2018. She is taking all medications as prescribed.    Current Barriers:  [name] Knowledge Deficits related to basic Diabetes pathophysiology and self care/management  Nurse Case Manager Clinical Goal(s):  [name] Over the next 14 days, patient will demonstrate improved adherence to prescribed treatment plan for diabetes self care/management as evidenced by checking blood glucose levels as prescribed, adhering to ADA/low carb diet, daily exercise, and medication adherence-goal completed 09/12/2018 . Over the next 60 days, patient will continue to demonstrate improved adherence to prescribed treatment plan for diabetes self care/management as evidenced by checking blood glucose levels as prescribed, adhering to ADA/low carb diet, daily exercise, and medication adherence  Interventions:  . Reviewed blood sugar log . Discussed importance of carb counting with patient and daughter . Strongly encouraged patient to exercise/walk daily and discussed how this can lower blood sugars . Assessed for receipt of previously provided DM education via mail . Reviewed scheduled/upcoming provider appointments including: 08/12/2018 PCP, 08/15/2018 urology . Advised patient, providing education and rationale, to check cbg per [facility] recommendations and call endocrinology office if blood sugars do not respond to hyperglycemia treatment  Patient Self Care Activities:  . Self administers medications as prescribed . Attends all scheduled provider appointments . Checks  blood sugars as prescribed and utilize hyper and hypoglycemia protocol as needed . Adhere to ADA diet as discussed  Please see past updates related to this goal by clicking on the "Past Updates" button in the selected goal         The care management  team will reach out to the patient again over the next 60 days.  The patient has been provided with contact information for the care management team and has been advised to call with any health related questions or concerns.      Vieva Brummitt E. Rollene Rotunda, RN, BSN Nurse Care Coordinator [facility] Care Management  [phone]

## 2018-09-15 DIAGNOSIS — J449 Chronic obstructive pulmonary disease, unspecified: Secondary | ICD-10-CM | POA: Diagnosis not present

## 2018-09-15 DIAGNOSIS — G4733 Obstructive sleep apnea (adult) (pediatric): Secondary | ICD-10-CM | POA: Diagnosis not present

## 2018-09-21 DIAGNOSIS — J449 Chronic obstructive pulmonary disease, unspecified: Secondary | ICD-10-CM | POA: Diagnosis not present

## 2018-09-21 DIAGNOSIS — G4733 Obstructive sleep apnea (adult) (pediatric): Secondary | ICD-10-CM | POA: Diagnosis not present

## 2018-09-22 DIAGNOSIS — R569 Unspecified convulsions: Secondary | ICD-10-CM | POA: Diagnosis not present

## 2018-09-22 DIAGNOSIS — Z794 Long term (current) use of insulin: Secondary | ICD-10-CM | POA: Diagnosis not present

## 2018-09-22 DIAGNOSIS — E1165 Type 2 diabetes mellitus with hyperglycemia: Secondary | ICD-10-CM | POA: Diagnosis not present

## 2018-09-22 DIAGNOSIS — I639 Cerebral infarction, unspecified: Secondary | ICD-10-CM | POA: Diagnosis not present

## 2018-09-29 DIAGNOSIS — I10 Essential (primary) hypertension: Secondary | ICD-10-CM | POA: Diagnosis not present

## 2018-09-29 DIAGNOSIS — E1165 Type 2 diabetes mellitus with hyperglycemia: Secondary | ICD-10-CM | POA: Diagnosis not present

## 2018-09-29 DIAGNOSIS — Z794 Long term (current) use of insulin: Secondary | ICD-10-CM | POA: Diagnosis not present

## 2018-09-29 DIAGNOSIS — E1142 Type 2 diabetes mellitus with diabetic polyneuropathy: Secondary | ICD-10-CM | POA: Diagnosis not present

## 2018-09-29 DIAGNOSIS — E669 Obesity, unspecified: Secondary | ICD-10-CM | POA: Diagnosis not present

## 2018-09-29 DIAGNOSIS — E782 Mixed hyperlipidemia: Secondary | ICD-10-CM | POA: Diagnosis not present

## 2018-10-05 DIAGNOSIS — J449 Chronic obstructive pulmonary disease, unspecified: Secondary | ICD-10-CM | POA: Diagnosis not present

## 2018-10-06 ENCOUNTER — Other Ambulatory Visit: Payer: Self-pay | Admitting: Family Medicine

## 2018-10-06 DIAGNOSIS — I252 Old myocardial infarction: Secondary | ICD-10-CM

## 2018-10-06 DIAGNOSIS — E1129 Type 2 diabetes mellitus with other diabetic kidney complication: Secondary | ICD-10-CM

## 2018-10-06 DIAGNOSIS — B373 Candidiasis of vulva and vagina: Secondary | ICD-10-CM

## 2018-10-06 DIAGNOSIS — IMO0002 Reserved for concepts with insufficient information to code with codable children: Secondary | ICD-10-CM

## 2018-10-06 DIAGNOSIS — K219 Gastro-esophageal reflux disease without esophagitis: Secondary | ICD-10-CM

## 2018-10-06 DIAGNOSIS — N3281 Overactive bladder: Secondary | ICD-10-CM

## 2018-10-06 DIAGNOSIS — B3731 Acute candidiasis of vulva and vagina: Secondary | ICD-10-CM

## 2018-10-06 NOTE — Telephone Encounter (Signed)
Requested medication (s) are due for refill today: yes  Requested medication (s) are on the active medication list: yes  Last refill:  07/21/2018  Future visit scheduled: no  Notes to clinic:  Review for refill   Requested Prescriptions  Pending Prescriptions Disp Refills   clopidogrel (PLAVIX) 75 MG tablet [Pharmacy Med Name: CLOPIDOGREL 75 MG Tablet] 90 tablet 0    Sig: TAKE 1 TABLET EVERY DAY     Hematology: Antiplatelets - clopidogrel Failed - 10/06/2018  1:08 PM      Failed - Evaluate AST, ALT within 2 months of therapy initiation.      Failed - HCT in normal range and within 180 days    HCT  Date Value Ref Range Status  02/04/2018 43.3 36.0 - 46.0 % Final   Hematocrit  Date Value Ref Range Status  05/08/2015 39.5 34.0 - 46.6 % Final         Failed - HGB in normal range and within 180 days    Hemoglobin  Date Value Ref Range Status  02/04/2018 13.6 12.0 - 15.0 g/dL Final  05/08/2015 13.1 11.1 - 15.9 g/dL Final         Failed - PLT in normal range and within 180 days    Platelets  Date Value Ref Range Status  02/04/2018 287 150 - 400 K/uL Final  05/08/2015 274 150 - 379 x10E3/uL Final         Passed - ALT in normal range and within 360 days    ALT  Date Value Ref Range Status  01/24/2018 11 6 - 29 U/L Final         Passed - AST in normal range and within 360 days    AST  Date Value Ref Range Status  01/24/2018 10 10 - 35 U/L Final         Passed - Valid encounter within last 6 months    Recent Outpatient Visits          2 months ago Restrictive airway disease   [place], [place], [name]   3 months ago Well woman exam with routine gynecological exam   [place], [place], [name]   8 months ago Restrictive airway disease   [place], Astrid Divine, [name]   1 year ago Chronic bilateral low back pain, with sciatica presence unspecified   Therapist, [facility] at [facility], Malka So, MD   1 year ago DM type 2, uncontrolled, with neuropathy ([place])   [place] at [facility], Malka So, MD              [place] 250-50 MCG/DOSE Baltazar Najjar Med Name: [place] Ruts INHUB 250-50 MCG/DOSE Aerosol Powder Breath Activated] 180 each     Sig: INHALE 1 PUFF INTO THE LUNGS 2 (TWO) TIMES DAILY.     There is no refill protocol information for this order     topiramate (TOPAMAX) 200 MG tablet [Pharmacy Med Name: TOPIRAMATE 200 MG Tablet] 180 tablet     Sig: TAKE 1 TABLET TWICE DAILY     Not Delegated - Neurology: Anticonvulsants - topiramate & zonisamide Failed - 10/06/2018  1:08 PM      Failed - This refill cannot be delegated      Passed - Cr in normal range and within 360 days    Creat  Date Value Ref Range Status  01/24/2018 0.78 0.50 - 1.05 mg/dL Final  Comment:    For patients >15 years of age, the reference limit for Creatinine is approximately 13% higher for people identified as African-American. .    Creatinine, Ser  Date Value Ref Range Status  02/04/2018 0.85 0.44 - 1.00 mg/dL Final         Passed - CO2 in normal range and within 360 days    CO2  Date Value Ref Range Status  02/04/2018 25 22 - 32 mmol/L Final         Passed - Valid encounter within last 12 months    Recent Outpatient Visits          2 months ago Restrictive airway disease   [place], Astrid Divine, [name]   3 months ago Well woman exam with routine gynecological exam   [place], [place], [name]   8 months ago Restrictive airway disease   [place], [name]   1 year ago Chronic bilateral low back pain, with sciatica presence unspecified   Therapist, music at [facility], Malka So, MD   1 year ago DM type 2, uncontrolled, with neuropathy ([place])   [place] at [facility], Malka So, MD              TRULICITY 1.5 JG/2.8ZM SOPN [Pharmacy Med Name: TRULICITY 1.5  OQ/9.4TM Solution Pen-injector]      Sig: INJECT  1.5MG  INTO  THE  SKIN ONE [place]     Endocrinology:  Diabetes - GLP-1 Receptor Agonists Failed - 10/06/2018  1:08 PM      Failed - HBA1C is between 0 and 7.9 and within 180 days    Hgb A1c MFr Bld  Date Value Ref Range Status  01/24/2018 12.6 (H) <5.7 % of total Hgb Final    Comment:    For someone without known diabetes, a hemoglobin A1c value of 6.5% or greater indicates that they may have  diabetes and this should be confirmed with a follow-up  test. . For someone with known diabetes, a value <7% indicates  that their diabetes is well controlled and a value  greater than or equal to 7% indicates suboptimal  control. A1c targets should be individualized based on  duration of diabetes, age, comorbid conditions, and  other considerations. . Currently, no consensus exists regarding use of hemoglobin A1c for diagnosis of diabetes for children. Renella Cunas - Valid encounter within last 6 months    Recent Outpatient Visits          2 months ago Restrictive airway disease   [place], [place], [name]   3 months ago Well woman exam with routine gynecological exam   [place], [place], [name]   8 months ago Restrictive airway disease   [place], [name]   1 year ago Chronic bilateral low back pain, with sciatica presence unspecified   Therapist, [facility] at [facility], Malka So, MD   1 year ago DM type 2, uncontrolled, with neuropathy ([place])   [place] at [facility], Malka So, MD              gabapentin (NEURONTIN) 300 MG capsule [Pharmacy Med Name: GABAPENTIN 300 MG Capsule] 270 capsule     Sig: TAKE 1 [place]     Neurology: Anticonvulsants - gabapentin Passed - 10/06/2018  1:08 PM      Passed - Valid encounter within last 12 months    Recent Outpatient Visits          2 months ago  Restrictive airway disease   [place], [place], [name]   3 months ago Well woman exam with routine gynecological exam   [place], [place], [name]   8 months ago Restrictive airway disease   [place], [name]   1 year ago Chronic bilateral low back pain, with sciatica presence unspecified   Therapist, music at [facility], Malka So, MD   1 year ago DM type 2, uncontrolled, with neuropathy ([place])   [place] at [facility], Malka So, MD              MYRBETRIQ 50 MG TB24 tablet [Pharmacy Med Name: MYRBETRIQ 50 MG Tablet Extended Release 24 Hour] 90 tablet 0    Sig: TAKE 1 TABLET (50 MG TOTAL) BY MOUTH DAILY.     Urology: Bladder Agents - mirabegron Failed - 10/06/2018  1:08 PM      Failed - Last BP in normal range    BP Readings from Last 1 Encounters:  07/21/18 (!) 148/86         Passed - Valid encounter within last 12 months    Recent Outpatient Visits          2 months ago Restrictive airway disease   [place], Astrid Divine, [name]   3 months ago Well woman exam with routine gynecological exam   [place], [place], [name]   8 months ago Restrictive airway disease   [place], [name]   1 year ago Chronic bilateral low back pain, with sciatica presence unspecified   Therapist, music at [facility], Malka So, MD   1 year ago DM type 2, uncontrolled, with neuropathy ([place])   [place] at [facility], Malka So, MD              pantoprazole (PROTONIX) 40 MG tablet [Pharmacy Med Name: PANTOPRAZOLE SODIUM 40 MG Tablet Delayed Release] 90 tablet 0    Sig: Take 1 tablet (40 mg total) by mouth daily.     Gastroenterology: Proton Pump Inhibitors Passed - 10/06/2018  1:08 PM      Passed - Valid encounter within last 12 months    Recent Outpatient Visits          2 months ago  Restrictive airway disease   [place], [place], [name]   3 months ago Well woman exam with routine gynecological exam   [place], [place], [name]   8 months ago Restrictive airway disease   [place], [name]   1 year ago Chronic bilateral low back pain, with sciatica presence unspecified   Therapist, music at [facility], Malka So, MD   1 year ago DM type 2, uncontrolled, with neuropathy ([place])   [place] at [facility], Malka So, MD              terconazole (TERAZOL 7) 0.4 % vaginal cream [Pharmacy Med Name: TERCONAZOLE 0.4 % Cream] 45 g 0    Sig: APPLY  1  APPLICATOR  VAGINALLY AT BEDTIME AS DIRECTED     Off-Protocol Failed - 10/06/2018  1:08 PM  Failed - Medication not assigned to a protocol, review manually.      Passed - Valid encounter within last 12 months    Recent Outpatient Visits          2 months ago Restrictive airway disease   [place], [place], [name]   3 months ago Well woman exam with routine gynecological exam   [place], [name]   8 months ago Restrictive airway disease   [place], [name]   1 year ago Chronic bilateral low back pain, with sciatica presence unspecified   Therapist, music at [facility], Malka So, MD   1 year ago DM type 2, uncontrolled, with neuropathy ([place])   [place] at [facility], Malka So, MD

## 2018-10-06 NOTE — Telephone Encounter (Signed)
Routed to wrong practice. Re-routing to [facility].

## 2018-10-07 ENCOUNTER — Other Ambulatory Visit: Payer: Self-pay | Admitting: Emergency Medicine

## 2018-10-07 DIAGNOSIS — I252 Old myocardial infarction: Secondary | ICD-10-CM

## 2018-10-07 MED ORDER — CLOPIDOGREL BISULFATE 75 MG PO TABS: 75.0000 mg | ORAL_TABLET | Freq: Every day | ORAL | 0 refills | Status: DC

## 2018-10-07 NOTE — Telephone Encounter (Signed)
Order pend for refill

## 2018-10-10 NOTE — Telephone Encounter (Signed)
appt scheduled for November with [name]

## 2018-10-11 ENCOUNTER — Ambulatory Visit: Payer: Self-pay | Admitting: *Deleted

## 2018-10-11 ENCOUNTER — Encounter: Payer: Self-pay | Admitting: *Deleted

## 2018-10-11 NOTE — Progress Notes (Signed)
This encounter was created in error - please disregard.

## 2018-10-11 NOTE — Chronic Care Management (AMB) (Signed)
Chronic Care Management   Social Work Note  10/11/2018 Name: [name] MRN: [id number] DOB: 10-17-1965  [name] is a 53 y.o. year old female who sees Hubbard Hartshorn, [name] for primary care. The CCM team was consulted for assistance with [employer] .   Phone call from patient today stating that she has misplaced the housing resources that this social worker mailed to her has been misplaced. She is requesting that the materials be re-sent.   Outpatient Encounter Medications as of 10/11/2018  Medication Sig Note  . albuterol (PROAIR HFA) 108 (90 Base) MCG/ACT inhaler INHALE 2 PUFFS BY MOUTH EVERY 6 HOURS AS NEEDED FOR WHEEZING OR SHORTNESS OF BREATH 02/10/2018: Uses once a day  . AMBULATORY NON FORMULARY MEDICATION Medication Name: Muscle and Joint Vanishing Scent Gel- Apply as needed to skin   . baclofen (LIORESAL) 10 MG tablet Take 1 tablet (10 mg total) by mouth 3 (three) times daily.   . Blood Glucose Monitoring Suppl (FIFTY50 GLUCOSE METER 2.0) w/Device KIT [employer]   . butalbital-acetaminophen-caffeine (FIORICET, ESGIC) 50-325-40 MG tablet Take 1-2 tablets by mouth every 6 (six) hours as needed. 02/10/2018: Has not picked up from pharmacy  . Calcium Carbonate-Vitamin D (CALCIUM-VITAMIN D3 PO) D3 500- calcium 630 mg Take 1 capsule by mouth once daily   . cimetidine (TAGAMET) 300 MG tablet Take 1 tablet (300 mg total) by mouth 2 (two) times daily.   . clopidogrel (PLAVIX) 75 MG tablet Take 1 tablet (75 mg total) by mouth daily.   . cycloSPORINE (RESTASIS) 0.05 % ophthalmic emulsion Place 2 drops into both eyes 2 (two) times daily.   . Dulaglutide 1.5 MG/0.5ML SOPN Inject 1.5 mg into the skin once a week.   . fluorometholone (FML) 0.1 % ophthalmic suspension Place 2 drops into both eyes 2 (two) times daily.   . fluticasone (FLONASE) 50 MCG/ACT nasal spray Place 2 sprays into both nostrils daily.   . Fluticasone-Salmeterol (ADVAIR DISKUS) 250-50 MCG/DOSE AEPB  Inhale 1 puff into the lungs 2 (two) times daily.   [name] gabapentin (NEURONTIN) 400 MG capsule Take 400 mg by mouth 3 (three) times daily.   [name] glucose blood (PRECISION QID TEST) test strip [employer]k brand   . insulin aspart (NOVOLOG FLEXPEN) 100 UNIT/ML FlexPen Inject into the skin.   . Insulin Glargine, 2 Unit Dial, 300 UNIT/ML SOPN Inject 120 Units into the skin daily.   . Insulin Pen Needle (EASY TOUCH PEN NEEDLES) 31G X 8 MM MISC Use as directed in giving insulin. Dx: E11.29   . levETIRAcetam (KEPPRA) 500 MG tablet Take 500 mg by mouth 2 (two) times daily.   [name] losartan (COZAAR) 100 MG tablet Take 1 tablet (100 mg total) by mouth daily.   . mirabegron ER (MYRBETRIQ) 50 MG TB24 tablet Take 1 tablet (50 mg total) by mouth daily.   Glory Rose[place]ush DELICA LANCETS 08M MISC Use to check sugar three times daily as directed.Dx: E11.29; E11.40   . pantoprazole (PROTONIX) 40 MG tablet Take 1 tablet (40 mg total) by mouth daily.   . polyethylene glycol (MIRALAX / GLYCOLAX) packet Take 17 g by mouth daily as needed for mild constipation. 02/10/2018: daily  . potassium chloride SA (KLOR-CON M20) 20 MEQ tablet Take 2 tablets (40 mEq total) by mouth daily.   . simvastatin (ZOCOR) 40 MG tablet TAKE 1 TABLET BY MOUTH EVERY DAY   . terconazole (TERAZOL 7) 0.4 % vaginal cream Place 1 applicator vaginally at bedtime.   [name]  topiramate (TOPAMAX) 100 MG tablet Take 100 mg by mouth at bedtime.    No facility-administered encounter medications on file as of 10/11/2018.     Goals Addressed   None     Follow Up Plan: Appointment scheduled for SW follow up with client by phone on: 10/27/18 to ensure she has received the housing materials mailed   [place], [place] Worker  [facility] Care Management [phone]

## 2018-10-12 ENCOUNTER — Encounter: Payer: Self-pay | Admitting: Family Medicine

## 2018-10-12 ENCOUNTER — Ambulatory Visit (INDEPENDENT_AMBULATORY_CARE_PROVIDER_SITE_OTHER): Payer: Medicare HMO | Admitting: Family Medicine

## 2018-10-12 ENCOUNTER — Other Ambulatory Visit: Payer: Self-pay

## 2018-10-12 VITALS — BP 142/94 | HR 80 | Temp 97.9°F | Resp 14 | Ht 66.0 in | Wt 226.6 lb

## 2018-10-12 DIAGNOSIS — E1142 Type 2 diabetes mellitus with diabetic polyneuropathy: Secondary | ICD-10-CM | POA: Diagnosis not present

## 2018-10-12 DIAGNOSIS — M791 Myalgia, unspecified site: Secondary | ICD-10-CM

## 2018-10-12 DIAGNOSIS — E7800 Pure hypercholesterolemia, unspecified: Secondary | ICD-10-CM | POA: Diagnosis not present

## 2018-10-12 DIAGNOSIS — Z23 Encounter for immunization: Secondary | ICD-10-CM | POA: Diagnosis not present

## 2018-10-12 DIAGNOSIS — G894 Chronic pain syndrome: Secondary | ICD-10-CM

## 2018-10-12 DIAGNOSIS — I69359 Hemiplegia and hemiparesis following cerebral infarction affecting unspecified side: Secondary | ICD-10-CM

## 2018-10-12 DIAGNOSIS — J3089 Other allergic rhinitis: Secondary | ICD-10-CM

## 2018-10-12 DIAGNOSIS — M533 Sacrococcygeal disorders, not elsewhere classified: Secondary | ICD-10-CM

## 2018-10-12 DIAGNOSIS — I10 Essential (primary) hypertension: Secondary | ICD-10-CM

## 2018-10-12 DIAGNOSIS — E876 Hypokalemia: Secondary | ICD-10-CM | POA: Diagnosis not present

## 2018-10-12 DIAGNOSIS — G40909 Epilepsy, unspecified, not intractable, without status epilepticus: Secondary | ICD-10-CM

## 2018-10-12 DIAGNOSIS — Z5181 Encounter for therapeutic drug level monitoring: Secondary | ICD-10-CM | POA: Diagnosis not present

## 2018-10-12 MED ORDER — POTASSIUM CHLORIDE CRYS ER 20 MEQ PO TBCR: 40.0000 meq | EXTENDED_RELEASE_TABLET | Freq: Every day | ORAL | 0 refills | Status: DC

## 2018-10-12 MED ORDER — FLUTICASONE PROPIONATE 50 MCG/ACT NA SUSP: 2.0000 | Freq: Every day | NASAL | 3 refills | Status: DC

## 2018-10-12 MED ORDER — DULOXETINE HCL 20 MG PO CPEP: 20.0000 mg | ORAL_CAPSULE | Freq: Every day | ORAL | 1 refills | Status: DC

## 2018-10-12 MED ORDER — AMLODIPINE BESYLATE 5 MG PO TABS: 5.0000 mg | ORAL_TABLET | Freq: Every day | ORAL | 3 refills | Status: DC

## 2018-10-12 NOTE — Patient Instructions (Addendum)
Hold your statin for the next day or two while I check labs  I have referred you to pain management locally - for now start the cymbalta once a day - for pain  Start amlodopine with your losartan, monitor your blood pressure and write it down, we will follow up in one month

## 2018-10-12 NOTE — Progress Notes (Signed)
Patient ID: [name], female    DOB: [birth year], 53 y.o.   MRN: [id number]  PCP: Hubbard Hartshorn, [name]  Chief Complaint  Patient presents with  . Muscle Pain    all over  . Medication Refill    Subjective:   [name] is a 53 y.o. female, presents to clinic with CC of the following:  HPI  Pt has pain all over, started 5 days ago, sharp pain all over to all muscles and joints, 9/10, no alleviating or aggrivating factors.  She reports hx of neuropathy secondary to IDDM, sees neurology at [facility] for management, 2 months ago increased gabapentin dose to 400 mg TID, but her pain is much worse than it has been before in the past several months of managing the pain and gabapentin is not helping. She also sees multiple other specialists including Immunologist - endocrinologist, and recently seeing neurology with history of seizures, stopped topimax and started keppra   Last seizure was about 2 months ago during cardiac stress testing  Left sided numbness from shoulder to foot is chronic but she feels that is got worse Her left hemiparesis and weakness is about at baseline  She denies any other suspicions of being ill and does not think that anything like that would be causing her pain she specifically denies fever, chills, URI sx, CP.  She has a cough that is chronic and unchanged from her baseline.  She denies urinary sx or dark urine. She reports that she is on a statin medication, 40 mg simvastatin, she was on 20 mg for very long time but she is unsure about how long ago it was increased to 40 mg daily.  She does see endocrinologist for that.    She has no other associated alleviating or aggravating factors with her whole body pain. She denies any associated worsening weakness, no new numbness, no new or worsening swelling, no chest pain palpitations shortness of breath, lymphadenopathy, skin changes or rash.  Patient is new to me, her PCP is not in  clinic this week, I tried to review the chart and review many of her specialist recent office visits which a lot of are available in care everywhere or in this EMR.   Patient Active Problem List   Diagnosis Date Noted  . Vitamin D deficiency 01/24/2018  . Vulvovaginal candidiasis 01/24/2018  . Hypokalemia 06/08/2017  . Overactive bladder 05/18/2017  . Asthma 06/22/2016  . Dilated aortic root ([place])   . Sacroiliac dysfunction 10/09/2014  . Chronic low back pain 09/03/2014  . CVA, old, hemiparesis ([place]) 09/03/2014  . OSA (obstructive sleep apnea) 05/15/2014  . Restrictive airway disease 05/15/2014  . Colitis 02/22/2014  . Type 2 diabetes, uncontrolled, with renal manifestation ([place]) 01/21/2014  . Chronic pain disorder 01/21/2014  . Headache disorder 09/07/2013  . Diabetic neuropathy ([place]) 05/23/2013  . Mixed urge and stress incontinence 08/25/2011  . Ureteral stricture, right 08/25/2011  . Pyelonephritis 08/22/2011  . Nephrolithiasis 08/22/2011  . Incisional hernia 05/10/2011  . Morbid obesity ([place]) 05/10/2011  . Tachycardia 05/08/2011  . BLINDNESS, [place] DEFINITION 09/14/2006  . Hyperlipemia 09/13/2006  . Essential hypertension 09/13/2006  . MYOCARDIAL INFARCTION, HX OF 09/13/2006  . GERD 09/13/2006  . Seizure disorder ([place]) 09/13/2006      Current Outpatient Medications:  .  albuterol (PROAIR HFA) 108 (90 Base) MCG/ACT inhaler, INHALE 2 PUFFS BY MOUTH EVERY 6 HOURS AS NEEDED FOR WHEEZING OR SHORTNESS OF BREATH, Disp:  3 Inhaler, Rfl: 3 .  AMBULATORY NON FORMULARY MEDICATION, Medication Name: Muscle and Joint Vanishing Scent Gel- Apply as needed to skin, Disp: , Rfl:  .  baclofen (LIORESAL) 10 MG tablet, Take 1 tablet (10 mg total) by mouth 3 (three) times daily., Disp: 90 tablet, Rfl: 6 .  Blood Glucose Monitoring Suppl (FIFTY50 GLUCOSE METER 2.0) w/Device KIT, Accuchek, Disp: , Rfl:  .  butalbital-acetaminophen-caffeine (FIORICET, ESGIC) 50-325-40 MG tablet, Take 1-2  tablets by mouth every 6 (six) hours as needed., Disp: 20 tablet, Rfl: 0 .  Calcium Carbonate-Vitamin D (CALCIUM-VITAMIN D3 PO), D3 500- calcium 630 mg Take 1 capsule by mouth once daily, Disp: , Rfl:  .  cimetidine (TAGAMET) 300 MG tablet, Take 1 tablet (300 mg total) by mouth 2 (two) times daily., Disp: 180 tablet, Rfl: 1 .  clopidogrel (PLAVIX) 75 MG tablet, Take 1 tablet (75 mg total) by mouth daily., Disp: 90 tablet, Rfl: 0 .  cycloSPORINE (RESTASIS) 0.05 % ophthalmic emulsion, Place 2 drops into both eyes 2 (two) times daily., Disp: , Rfl:  .  Dulaglutide 1.5 MG/0.5ML SOPN, Inject 1.5 mg into the skin once a week., Disp: 1.5 mL, Rfl: 1 .  fluorometholone (FML) 0.1 % ophthalmic suspension, Place 2 drops into both eyes 2 (two) times daily., Disp: , Rfl:  .  fluticasone (FLONASE) 50 MCG/ACT nasal spray, Place 2 sprays into both nostrils daily., Disp: 16 g, Rfl: 3 .  Fluticasone-Salmeterol (ADVAIR DISKUS) 250-50 MCG/DOSE AEPB, Inhale 1 puff into the lungs 2 (two) times daily., Disp: 1 each, Rfl: 3 .  gabapentin (NEURONTIN) 400 MG capsule, Take 400 mg by mouth 3 (three) times daily., Disp: , Rfl:  .  glucose blood (PRECISION QID TEST) test strip, Accuchek brand, Disp: , Rfl:  .  insulin aspart (NOVOLOG FLEXPEN) 100 UNIT/ML FlexPen, Inject into the skin., Disp: , Rfl:  .  Insulin Glargine, 2 Unit Dial, 300 UNIT/ML SOPN, Inject 120 Units into the skin daily., Disp: 15 mL, Rfl: 1 .  Insulin Pen Needle (EASY TOUCH PEN NEEDLES) 31G X 8 MM MISC, Use as directed in giving insulin. Dx: E11.29, Disp: 400 each, Rfl: 3 .  levETIRAcetam (KEPPRA) 500 MG tablet, Take 500 mg by mouth 2 (two) times daily., Disp: , Rfl:  .  losartan (COZAAR) 100 MG tablet, Take 1 tablet (100 mg total) by mouth daily., Disp: 90 tablet, Rfl: 3 .  mirabegron ER (MYRBETRIQ) 50 MG TB24 tablet, Take 1 tablet (50 mg total) by mouth daily., Disp: 90 tablet, Rfl: 0 .  ONETOUCH DELICA LANCETS 00X MISC, Use to check sugar three times daily as  directed.Dx: E11.29; E11.40, Disp: 300 each, Rfl: 3 .  pantoprazole (PROTONIX) 40 MG tablet, Take 1 tablet (40 mg total) by mouth daily., Disp: 90 tablet, Rfl: 0 .  polyethylene glycol (MIRALAX / GLYCOLAX) packet, Take 17 g by mouth daily as needed for mild constipation., Disp: 100 each, Rfl: 3 .  potassium chloride SA (KLOR-CON M20) 20 MEQ tablet, Take 2 tablets (40 mEq total) by mouth daily., Disp: 60 tablet, Rfl: 0 .  simvastatin (ZOCOR) 40 MG tablet, TAKE 1 TABLET BY MOUTH EVERY DAY, Disp: 90 tablet, Rfl: 0 .  terconazole (TERAZOL 7) 0.4 % vaginal cream, Place 1 applicator vaginally at bedtime., Disp: 45 g, Rfl: 0 .  topiramate (TOPAMAX) 100 MG tablet, Take 100 mg by mouth at bedtime., Disp: , Rfl:    Allergies  Allergen Reactions  . Codeine Swelling and Other (See Comments)  Swelling and burning of mouth (inside)  . Lactose Intolerance (Gi) Nausea And Vomiting     Family History  Problem Relation Age of Onset  . Uterine cancer Mother   . Hypertension Mother   . Cancer Mother   . Brain cancer Maternal Grandmother   . Hypertension Maternal Grandmother   . Birth defects Daughter   . Hypertension Daughter   . Cirrhosis Maternal Grandfather   . ADD / ADHD Maternal Grandfather   . Birth defects Maternal Grandfather   . Diabetes Maternal Grandfather   . Anesthesia problems Neg Hx   . Colon cancer Neg Hx   . Esophageal cancer Neg Hx   . Pancreatic cancer Neg Hx      Social History   Socioeconomic History  . Marital status: Widowed    Spouse name: Not on file  . Number of children: 2  . Years of education: Not on file  . Highest education level: Not on file  Occupational History  . Occupation: disabled  Social Needs  . Financial resource strain: Not on file  . Food insecurity    Worry: Never true    Inability: Never true  . Transportation needs    Medical: No    Non-medical: No  Tobacco Use  . Smoking status: Former Smoker    Packs/day: 1.00    Years: 15.00     Pack years: 15.00    Types: Cigarettes    Quit date: 01/20/1987    Years since quitting: 31.7  . Smokeless tobacco: Never Used  . Tobacco comment: quit more than 20years  Substance and Sexual Activity  . Alcohol use: Yes    Alcohol/week: 0.0 standard drinks    Comment: 1 glass weekly  . Drug use: No    Comment: hx of marijuana use no longer uses   . Sexual activity: Yes    Partners: Male    Birth control/protection: Surgical  Lifestyle  . Physical activity    Days per week: 2 days    Minutes per session: 30 min  . Stress: Only a little  Relationships  . Social connections    Talks on phone: More than three times a week    Gets together: More than three times a week    Attends religious service: More than 4 times per year    Active member of club or organization: Yes    Attends meetings of clubs or organizations: More than 4 times per year    Relationship status: Widowed  . Intimate partner violence    Fear of current or ex partner: No    Emotionally abused: No    Physically abused: No    Forced sexual activity: No  Other Topics Concern  . Not on file  Social History Narrative  . Not on file    I personally reviewed active problem list, medication list, allergies, notes from last encounter, lab results with the patient/caregiver today.  Review of Systems  Constitutional: Negative.   HENT: Negative.   Eyes: Negative.   Respiratory: Negative.   Cardiovascular: Negative.   Gastrointestinal: Negative.   Endocrine: Negative.   Genitourinary: Negative.   Musculoskeletal: Negative.   Skin: Negative.   Allergic/Immunologic: Negative.   Neurological: Negative.   Hematological: Negative.   Psychiatric/Behavioral: Negative.   All other systems reviewed and are negative.      Objective:   Vitals:   10/12/18 0820  BP: (!) 142/94  Pulse: 80  Resp: 14  Temp: 97.9 F (36.6 C)  SpO2: 98%  Weight: 226 lb 9.6 oz (102.8 kg)  Height: 5' 6" (1.676 m)    Body mass index  is 36.57 kg/m.  Physical Exam Vitals signs and nursing note reviewed.  Constitutional:      General: She is not in acute distress.    Appearance: She is well-developed. She is obese. She is not ill-appearing, toxic-appearing or diaphoretic.  HENT:     Head: Normocephalic and atraumatic.     Nose: Nose normal.  Eyes:     General: No scleral icterus.       Right eye: No discharge.        Left eye: No discharge.     Conjunctiva/sclera: Conjunctivae normal.  Neck:     Musculoskeletal: Normal range of motion and neck supple.     Trachea: No tracheal deviation.  Cardiovascular:     Rate and Rhythm: Normal rate and regular rhythm.  Pulmonary:     Effort: Pulmonary effort is normal. No respiratory distress.     Breath sounds: Normal breath sounds. No stridor.  Abdominal:     General: Bowel sounds are normal. There is no distension.     Tenderness: There is no abdominal tenderness.  Musculoskeletal:     Comments: Muscle compartments soft to palpation, no firmness, induration no focal pain or tenderness No visible joint swelling or erythema  Lymphadenopathy:     Cervical: No cervical adenopathy.  Skin:    General: Skin is warm and dry.     Coloration: Skin is not jaundiced or pale.     Findings: No bruising, ecchymosis, erythema or rash.     Nails: There is no clubbing.   Neurological:     Mental Status: She is alert. Mental status is at baseline.     Motor: No weakness or abnormal muscle tone.     Gait: Gait abnormal.  Psychiatric:        Behavior: Behavior normal.        Assessment & Plan:      ICD-10-CM   1. Myalgia  M79.10 DULoxetine (CYMBALTA) 20 MG capsule    COMPLETE METABOLIC PANEL WITH GFR    Sedimentation rate    C-reactive protein    CK (Creatine Kinase)    Magnesium   New muscle pain, severe, onset 5 days ago, setting of very complex medical history and other chronic pain - pt new to me, I have tried to review multiple recent encounters and medications  extensively with the patient to see if there is any new med changes or med interactions.  Could possibly be statin induced myalgia, will check labs for that.   In reviewing med list I do not see any obvious med interactions that would cause sudden muscle pain or myopathy except for simvastatin.   2. Essential hypertension - Slightly elevated today but patient is in a lot of pain, continue Norvasc R67 COMPLETE METABOLIC PANEL WITH GFR    amLODipine (NORVASC) 5 MG tablet  3. Medication monitoring encounter  E93.81 COMPLETE METABOLIC PANEL WITH GFR    Microalbumin, urine    Lipid panel    Levetiracetam level  4. Diabetic polyneuropathy associated with type 2 diabetes mellitus (HCC)  E11.42 DULoxetine (CYMBALTA) 20 MG capsule  Sees endocrinology and urology may benefit from pain clinic management  COMPLETE METABOLIC PANEL WITH GFR    Ambulatory referral to Pain Clinic  5. Morbid obesity (HCC)  O17.51 COMPLETE METABOLIC PANEL WITH GFR  No change to weight, screening labs  Lipid  panel  6. Non-seasonal allergic rhinitis, unspecified trigger   Med refill for seasonal allergies and congestion J30.89 fluticasone (FLONASE) 50 MCG/ACT nasal spray  7. CVA, old, hemiparesis ([place])  Exam today she states consistent with her history of stroke and left-sided hemiparesis  I69.359   8. Hypokalemia  F62.1 COMPLETE METABOLIC PANEL WITH GFR  She is on potassium supplement will recheck labs  potassium chloride SA (KLOR-CON M20) 20 MEQ tablet  9. Seizure disorder ([place])  H08.657 COMPLETE METABOLIC PANEL WITH GFR  Per neurology but will obtain labs to monitor, ensure they are therapeutic and rule out any potential cause of her symptoms  Levetiracetam level  10. Hypercholesterolemia  Q46.96 COMPLETE METABOLIC PANEL WITH GFR    Lipid panel  11. Sacroiliac dysfunction  M53.3 Ambulatory referral to Pain Clinic  12. Chronic pain syndrome  G89.4 Ambulatory referral to Pain Clinic  13. Need for influenza vaccination  Z23  Flu Vaccine QUAD 6+ mos PF IM (Fluarix Quad PF)    Myalgias with unclear etiology - already on high dose gabapentin and many other med with potential for polypharmacy.  Will check labs, refer to pain management.  Patient was also encouraged to follow-up with her specialist who have been helping to manage some of her neuropathy and chronic pain    Delsa Grana, [name] 10/12/18 8:38 AM

## 2018-10-13 ENCOUNTER — Other Ambulatory Visit: Payer: Self-pay | Admitting: Family Medicine

## 2018-10-13 ENCOUNTER — Ambulatory Visit: Payer: Self-pay | Admitting: *Deleted

## 2018-10-13 DIAGNOSIS — M791 Myalgia, unspecified site: Secondary | ICD-10-CM

## 2018-10-13 DIAGNOSIS — T466X5A Adverse effect of antihyperlipidemic and antiarteriosclerotic drugs, initial encounter: Secondary | ICD-10-CM

## 2018-10-13 DIAGNOSIS — E782 Mixed hyperlipidemia: Secondary | ICD-10-CM

## 2018-10-13 MED ORDER — EZETIMIBE 10 MG PO TABS: 10.0000 mg | ORAL_TABLET | Freq: Every day | ORAL | 0 refills | Status: DC

## 2018-10-13 MED ORDER — SIMVASTATIN 20 MG PO TABS: 20.0000 mg | ORAL_TABLET | Freq: Every day | ORAL | 0 refills | Status: DC

## 2018-10-13 NOTE — Chronic Care Management (AMB) (Signed)
Chronic Care Management   Social Work Note  10/13/2018 Name: [name] MRN: [id number] DOB: 1965-07-16  [name] is a 53 y.o. year old female who sees Hubbard Hartshorn, [name] for primary care. The CCM team was consulted for assistance with [employer] .   Housing resources requested mailed to patient today. Per patient, she misplaced the original set of information previously provided.  Outpatient Encounter Medications as of 10/13/2018  Medication Sig Note  . albuterol (PROAIR HFA) 108 (90 Base) MCG/ACT inhaler INHALE 2 PUFFS BY MOUTH EVERY 6 HOURS AS NEEDED FOR WHEEZING OR SHORTNESS OF BREATH 02/10/2018: Uses once a day  . AMBULATORY NON FORMULARY MEDICATION Medication Name: Muscle and Joint Vanishing Scent Gel- Apply as needed to skin   . amLODipine (NORVASC) 5 MG tablet Take 1 tablet (5 mg total) by mouth daily.   . baclofen (LIORESAL) 10 MG tablet Take 1 tablet (10 mg total) by mouth 3 (three) times daily.   . Blood Glucose Monitoring Suppl (FIFTY50 GLUCOSE METER 2.0) w/Device KIT Accuchek   . butalbital-acetaminophen-caffeine (FIORICET, ESGIC) 50-325-40 MG tablet Take 1-2 tablets by mouth every 6 (six) hours as needed. 02/10/2018: Has not picked up from pharmacy  . Calcium Carbonate-Vitamin D (CALCIUM-VITAMIN D3 PO) D3 500- calcium 630 mg Take 1 capsule by mouth once daily   . cimetidine (TAGAMET) 300 MG tablet Take 1 tablet (300 mg total) by mouth 2 (two) times daily.   . clopidogrel (PLAVIX) 75 MG tablet Take 1 tablet (75 mg total) by mouth daily.   . cycloSPORINE (RESTASIS) 0.05 % ophthalmic emulsion Place 2 drops into both eyes 2 (two) times daily.   . Dulaglutide 1.5 MG/0.5ML SOPN Inject 1.5 mg into the skin once a week.   . DULoxetine (CYMBALTA) 20 MG capsule Take 1 capsule (20 mg total) by mouth daily.   . fluorometholone (FML) 0.1 % ophthalmic suspension Place 2 drops into both eyes 2 (two) times daily.   . fluticasone (FLONASE) 50 MCG/ACT nasal spray  Place 2 sprays into both nostrils daily.   . Fluticasone-Salmeterol (ADVAIR DISKUS) 250-50 MCG/DOSE AEPB Inhale 1 puff into the lungs 2 (two) times daily.   [name] gabapentin (NEURONTIN) 400 MG capsule Take 400 mg by mouth 3 (three) times daily.   [name] glucose blood (PRECISION QID TEST) test strip Accuchek brand   . insulin aspart (NOVOLOG FLEXPEN) 100 UNIT/ML FlexPen Inject into the skin.   . Insulin Glargine, 2 Unit Dial, 300 UNIT/ML SOPN Inject 120 Units into the skin daily.   . Insulin Pen Needle (EASY TOUCH PEN NEEDLES) 31G X 8 MM MISC Use as directed in giving insulin. Dx: E11.29   . levETIRAcetam (KEPPRA) 500 MG tablet Take 500 mg by mouth 2 (two) times daily.   [name] losartan (COZAAR) 100 MG tablet Take 1 tablet (100 mg total) by mouth daily.   . mirabegron ER (MYRBETRIQ) 50 MG TB24 tablet Take 1 tablet (50 mg total) by mouth daily.   Glory Rosebush DELICA LANCETS 17E MISC Use to check sugar three times daily as directed.Dx: E11.29; E11.40   . pantoprazole (PROTONIX) 40 MG tablet Take 1 tablet (40 mg total) by mouth daily.   . polyethylene glycol (MIRALAX / GLYCOLAX) packet Take 17 g by mouth daily as needed for mild constipation. 02/10/2018: daily  . potassium chloride SA (KLOR-CON M20) 20 MEQ tablet Take 2 tablets (40 mEq total) by mouth daily.   . simvastatin (ZOCOR) 40 MG tablet TAKE 1 TABLET BY MOUTH EVERY  DAY   . terconazole (TERAZOL 7) 0.4 % vaginal cream Place 1 applicator vaginally at bedtime.    No facility-administered encounter medications on file as of 10/13/2018.     Goals Addressed   None     Follow Up Plan: SW will follow up with patient by phone over the next 2 weeks to confirm that she has received the information mailed to her  Elliot Gurney, [place] Worker  [facility] [phone]

## 2018-10-16 LAB — COMPLETE METABOLIC PANEL WITH GFR
AG Ratio: 1.2 (calc) (ref 1.0–2.5)
ALT: 13 U/L (ref 6–29)
AST: 13 U/L (ref 10–35)
Albumin: 3.8 g/dL (ref 3.6–5.1)
Alkaline phosphatase (APISO): 72 U/L (ref 37–153)
BUN: 22 mg/dL (ref 7–25)
CO2: 28 mmol/L (ref 20–32)
Calcium: 9.3 mg/dL (ref 8.6–10.4)
Chloride: 106 mmol/L (ref 98–110)
Creat: 0.83 mg/dL (ref 0.50–1.05)
GFR, Est African American: 93 mL/min/{1.73_m2} (ref >=60)
GFR, Est Non African American: 80 mL/min/{1.73_m2} (ref >=60)
Globulin: 3.1 g/dL (calc) (ref 1.9–3.7)
Glucose, Bld: 53 mg/dL — ABNORMAL LOW (ref 65–99)
Potassium: 4.2 mmol/L (ref 3.5–5.3)
Sodium: 143 mmol/L (ref 135–146)
Total Bilirubin: 0.2 mg/dL (ref 0.2–1.2)
Total Protein: 6.9 g/dL (ref 6.1–8.1)

## 2018-10-16 LAB — LIPID PANEL
Cholesterol: 145 mg/dL (ref <200)
HDL: 42 mg/dL — ABNORMAL LOW (ref >=50)
LDL Cholesterol (Calc): 83 mg/dL (calc)
Non-HDL Cholesterol (Calc): 103 mg/dL (calc) (ref <130)
Total CHOL/HDL Ratio: 3.5 (calc) (ref <5.0)
Triglycerides: 106 mg/dL (ref <150)

## 2018-10-16 LAB — CK: Total CK: 181 U/L — ABNORMAL HIGH (ref 29–143)

## 2018-10-16 LAB — MICROALBUMIN, URINE: Microalb, Ur: 3.2 mg/dL

## 2018-10-16 LAB — SEDIMENTATION RATE: Sed Rate: 36 mm/h — ABNORMAL HIGH (ref 0–30)

## 2018-10-16 LAB — MAGNESIUM: Magnesium: 2.1 mg/dL (ref 1.5–2.5)

## 2018-10-16 LAB — C-REACTIVE PROTEIN: CRP: 3.1 mg/L (ref <8.0)

## 2018-10-16 LAB — LEVETIRACETAM LEVEL: Keppra (Levetiracetam): <1 ug/mL — ABNORMAL LOW (ref 12.0–46.0)

## 2018-10-17 ENCOUNTER — Other Ambulatory Visit: Payer: Self-pay | Admitting: Family Medicine

## 2018-10-17 DIAGNOSIS — E1129 Type 2 diabetes mellitus with other diabetic kidney complication: Secondary | ICD-10-CM

## 2018-10-17 DIAGNOSIS — J449 Chronic obstructive pulmonary disease, unspecified: Secondary | ICD-10-CM | POA: Diagnosis not present

## 2018-10-17 DIAGNOSIS — IMO0002 Reserved for concepts with insufficient information to code with codable children: Secondary | ICD-10-CM

## 2018-10-17 NOTE — Telephone Encounter (Signed)
Pt is requesting a refill for Dulaglutide 1.5 MG/0.5ML SOPN    Pharmacy:  [facility] [place], [place] [phone] (Phone) [phone] (Fax)

## 2018-10-18 ENCOUNTER — Other Ambulatory Visit: Payer: Self-pay

## 2018-10-18 DIAGNOSIS — G4733 Obstructive sleep apnea (adult) (pediatric): Secondary | ICD-10-CM | POA: Diagnosis not present

## 2018-10-18 DIAGNOSIS — J449 Chronic obstructive pulmonary disease, unspecified: Secondary | ICD-10-CM | POA: Diagnosis not present

## 2018-10-18 DIAGNOSIS — N3281 Overactive bladder: Secondary | ICD-10-CM

## 2018-10-18 NOTE — Telephone Encounter (Signed)
Refill request for general medication. Myrbetriq to [facility].   Last office visit 10/12/2018   Follow up on 11/14/2018

## 2018-10-20 MED ORDER — DULAGLUTIDE 1.5 MG/0.5ML ~~LOC~~ SOAJ: 1.5000 mg | SUBCUTANEOUS | 3 refills | Status: DC

## 2018-10-20 MED ORDER — MIRABEGRON ER 50 MG PO TB24: 50.0000 mg | ORAL_TABLET | Freq: Every day | ORAL | 1 refills | Status: DC

## 2018-10-25 ENCOUNTER — Other Ambulatory Visit: Payer: Self-pay | Admitting: Emergency Medicine

## 2018-10-25 DIAGNOSIS — E876 Hypokalemia: Secondary | ICD-10-CM

## 2018-11-04 DIAGNOSIS — J449 Chronic obstructive pulmonary disease, unspecified: Secondary | ICD-10-CM | POA: Diagnosis not present

## 2018-11-06 ENCOUNTER — Other Ambulatory Visit: Payer: Self-pay

## 2018-11-06 ENCOUNTER — Emergency Department: Payer: Medicare HMO

## 2018-11-06 ENCOUNTER — Encounter: Payer: Self-pay | Admitting: Emergency Medicine

## 2018-11-06 ENCOUNTER — Emergency Department
Admission: EM | Admit: 2018-11-06 | Discharge: 2018-11-06 | Disposition: A | Discharge status: Home / Self Care | Payer: Medicare HMO | Attending: Emergency Medicine | Admitting: Emergency Medicine

## 2018-11-06 DIAGNOSIS — Z79899 Other long term (current) drug therapy: Secondary | ICD-10-CM | POA: Diagnosis not present

## 2018-11-06 DIAGNOSIS — Z8673 Personal history of transient ischemic attack (TIA), and cerebral infarction without residual deficits: Secondary | ICD-10-CM | POA: Insufficient documentation

## 2018-11-06 DIAGNOSIS — M7918 Myalgia, other site: Secondary | ICD-10-CM | POA: Insufficient documentation

## 2018-11-06 DIAGNOSIS — N390 Urinary tract infection, site not specified: Secondary | ICD-10-CM | POA: Diagnosis not present

## 2018-11-06 DIAGNOSIS — Z87891 Personal history of nicotine dependence: Secondary | ICD-10-CM | POA: Diagnosis not present

## 2018-11-06 DIAGNOSIS — R197 Diarrhea, unspecified: Secondary | ICD-10-CM | POA: Insufficient documentation

## 2018-11-06 DIAGNOSIS — K5730 Diverticulosis of large intestine without perforation or abscess without bleeding: Secondary | ICD-10-CM | POA: Diagnosis not present

## 2018-11-06 DIAGNOSIS — I252 Old myocardial infarction: Secondary | ICD-10-CM | POA: Insufficient documentation

## 2018-11-06 DIAGNOSIS — J449 Chronic obstructive pulmonary disease, unspecified: Secondary | ICD-10-CM | POA: Insufficient documentation

## 2018-11-06 DIAGNOSIS — E1140 Type 2 diabetes mellitus with diabetic neuropathy, unspecified: Secondary | ICD-10-CM | POA: Diagnosis not present

## 2018-11-06 DIAGNOSIS — R110 Nausea: Secondary | ICD-10-CM | POA: Diagnosis not present

## 2018-11-06 DIAGNOSIS — R1084 Generalized abdominal pain: Secondary | ICD-10-CM

## 2018-11-06 DIAGNOSIS — R109 Unspecified abdominal pain: Secondary | ICD-10-CM | POA: Diagnosis present

## 2018-11-06 DIAGNOSIS — Z794 Long term (current) use of insulin: Secondary | ICD-10-CM | POA: Insufficient documentation

## 2018-11-06 DIAGNOSIS — R1010 Upper abdominal pain, unspecified: Secondary | ICD-10-CM | POA: Diagnosis not present

## 2018-11-06 DIAGNOSIS — I10 Essential (primary) hypertension: Secondary | ICD-10-CM | POA: Insufficient documentation

## 2018-11-06 DIAGNOSIS — N200 Calculus of kidney: Secondary | ICD-10-CM | POA: Diagnosis not present

## 2018-11-06 LAB — URINALYSIS, COMPLETE (UACMP) WITH MICROSCOPIC
Bilirubin Urine: NEGATIVE
Glucose, UA: NEGATIVE mg/dL
Ketones, ur: NEGATIVE mg/dL
Nitrite: NEGATIVE
Protein, ur: NEGATIVE mg/dL
Specific Gravity, Urine: 1.026 (ref 1.005–1.030)
pH: 6 (ref 5.0–8.0)

## 2018-11-06 LAB — COMPREHENSIVE METABOLIC PANEL
ALT: 16 U/L (ref 0–44)
AST: 12 U/L — ABNORMAL LOW (ref 15–41)
Albumin: 3.9 g/dL (ref 3.5–5.0)
Alkaline Phosphatase: 81 U/L (ref 38–126)
Anion gap: 5 (ref 5–15)
BUN: 21 mg/dL — ABNORMAL HIGH (ref 6–20)
CO2: 28 mmol/L (ref 22–32)
Calcium: 9.2 mg/dL (ref 8.9–10.3)
Chloride: 107 mmol/L (ref 98–111)
Creatinine, Ser: 0.9 mg/dL (ref 0.44–1.00)
GFR calc Af Amer: >60 mL/min (ref >60)
GFR calc non Af Amer: >60 mL/min (ref >60)
Glucose, Bld: 142 mg/dL — ABNORMAL HIGH (ref 70–99)
Potassium: 4.3 mmol/L (ref 3.5–5.1)
Sodium: 140 mmol/L (ref 135–145)
Total Bilirubin: 0.5 mg/dL (ref 0.3–1.2)
Total Protein: 7.9 g/dL (ref 6.5–8.1)

## 2018-11-06 LAB — CBC
HCT: 39.5 % (ref 36.0–46.0)
Hemoglobin: 12.5 g/dL (ref 12.0–15.0)
MCH: 29.2 pg (ref 26.0–34.0)
MCHC: 31.6 g/dL (ref 30.0–36.0)
MCV: 92.3 fL (ref 80.0–100.0)
Platelets: 268 10*3/uL (ref 150–400)
RBC: 4.28 MIL/uL (ref 3.87–5.11)
RDW: 13.3 % (ref 11.5–15.5)
WBC: 6.3 10*3/uL (ref 4.0–10.5)
nRBC: 0 % (ref 0.0–0.2)

## 2018-11-06 LAB — LIPASE, BLOOD: Lipase: 30 U/L (ref 11–51)

## 2018-11-06 LAB — CK: Total CK: 149 U/L (ref 38–234)

## 2018-11-06 MED ORDER — MORPHINE SULFATE (PF) 4 MG/ML IV SOLN
4.0000 mg | Freq: Once | INTRAVENOUS | Status: AC
  Administered 2018-11-06: 4 mg via INTRAVENOUS
  Filled 2018-11-06: qty 1

## 2018-11-06 MED ORDER — SODIUM CHLORIDE 0.9% FLUSH: 3.0000 mL | Freq: Once | INTRAVENOUS | Status: DC

## 2018-11-06 MED ORDER — ONDANSETRON HCL 4 MG/2ML IJ SOLN
4.0000 mg | Freq: Once | INTRAMUSCULAR | Status: AC
  Administered 2018-11-06: 4 mg via INTRAVENOUS
  Filled 2018-11-06: qty 2

## 2018-11-06 MED ORDER — OXYCODONE-ACETAMINOPHEN 5-325 MG PO TABS: 1.0000 | ORAL_TABLET | ORAL | 0 refills | Status: DC | PRN

## 2018-11-06 MED ORDER — IOHEXOL 9 MG/ML PO SOLN
500.0000 mL | Freq: Two times a day (BID) | ORAL | Status: DC | PRN
  Administered 2018-11-06 (×2): 500 mL via ORAL
  Filled 2018-11-06 (×2): qty 500

## 2018-11-06 MED ORDER — PROCHLORPERAZINE EDISYLATE 10 MG/2ML IJ SOLN
10.0000 mg | Freq: Once | INTRAMUSCULAR | Status: AC
  Administered 2018-11-06: 10 mg via INTRAVENOUS
  Filled 2018-11-06: qty 2

## 2018-11-06 MED ORDER — CEPHALEXIN 500 MG PO CAPS: 500.0000 mg | ORAL_CAPSULE | Freq: Three times a day (TID) | ORAL | 0 refills | Status: DC

## 2018-11-06 MED ORDER — IOHEXOL 300 MG/ML  SOLN
100.0000 mL | Freq: Once | INTRAMUSCULAR | Status: AC | PRN
  Administered 2018-11-06: 100 mL via INTRAVENOUS

## 2018-11-06 MED ORDER — SODIUM CHLORIDE 0.9 % IV BOLUS
1000.0000 mL | Freq: Once | INTRAVENOUS | Status: AC
  Administered 2018-11-06: 1000 mL via INTRAVENOUS

## 2018-11-06 MED ORDER — HYDROMORPHONE HCL 1 MG/ML IJ SOLN: 0.5000 mg | Freq: Once | INTRAMUSCULAR | Status: DC

## 2018-11-06 MED ORDER — PROCHLORPERAZINE MALEATE 10 MG PO TABS: 10.0000 mg | ORAL_TABLET | Freq: Four times a day (QID) | ORAL | 0 refills | Status: DC | PRN

## 2018-11-06 NOTE — ED Triage Notes (Signed)
Pt reports this am woke up and was hurting all over. Pt c/o pain to her abd, back, legs, arms. Pt reports feels nauseated as well but denies all other symptoms.

## 2018-11-06 NOTE — ED Notes (Signed)
This RN to bedside due to patient calling out. Pt states she needs to urinate. Pt placed on bedpan by Larene Beach, RN due to patient stating she is "too weak to make it to the toilet". This RN collected UA sample at this time. This RN spoke with Dr. Cinda Quest regarding pain medication despite several minutes prior to his arrival to room pt was documented sleeping. Pt c/o pain to this RN 9/10 in her back and and abdomen that is continued from earlier. Pt also noted to have full and strong ROM to all extremities, is able to lift her buttocks off of bed, move herself up in bed without assistance, and is able to reposition herself without difficulty or assistance from this RN. Dr. Cinda Quest made aware at this time, per Dr. Cinda Quest, administer compazine, however hold Dilaudid at this time.

## 2018-11-06 NOTE — ED Provider Notes (Addendum)
[facility] Emergency Department Provider Note   ____________________________________________   First MD Initiated Contact with Patient 11/06/18 1100     (approximate)  I have reviewed the triage vital signs and the nursing notes.   HISTORY  Chief Complaint Nausea, Abdominal Pain, and Generalized Body Aches   HPI [name] is a 53 y.o. female who reports she woke up this morning with burning pain in her tummy.  She is nauseated but has no vomiting.  She is not having a fever.  She had a little bit of diarrhea.  Her arms and legs are hurting as well.  She has some back pain mostly on the left side in the CVA area.  Pain is crampy and burning.  Moderately severe.  Made worse with palpation.         Past Medical History:  Diagnosis Date   Anxiety and depression 09/13/2006   Qualifier: Diagnosis of  By: Hassell Done [name], [place]     Arthritis    joint pain    Asthma    COPD (chronic obstructive pulmonary disease) ([place])    chronic bronchitis    Depression    Diabetes mellitus    since age 64; type 2 IDDM   Diabetic neuropathy, painful ([place])    FEET AND HANDS   Diabetic retinopathy ([place])    Diastolic dysfunction    a.  Echo 11/17: EF 60-65%, mild LVH, no RWMA, Gr1DD, mild AI, dilated aortic root measuring 38 mm, mildly dilated ascending aorta   Dilated aortic root (HCC)    24m by echo 11/2015   Dizziness    secondary to diabetes and hypertension    Epilepsy idiopathic petit mal ([place]    last seizure 2012;controlled w/ topomax   GERD (gastroesophageal reflux disease)    Glaucoma    NOT ON ANY EYE DROPS    Headache(784.0)    migraines    Heart murmur    born with    History of stress test    a. 11/17: Normal perfusion, EF 53%, normal study   Hx of blood clots    hematomas removed from left side of brain from [id number]    Hyperlipidemia    Hypertension    Legally blind    left eye    MIGRAINE HEADACHE  09/14/2006   Qualifier: Diagnosis of  By: MarHassell DoneP, Nykedtra     [facility] ([facility]  [facility].  Patient reported history of without objective documentation   Nephrolithiasis    frequent urination , urination at nite  PT SEEN IN ER 09/22/11 FOR BACK AND RT SIDED PAIN--HAS KNOWN STONE RT URETER AND UA IN ER SHOWED UTI   Pain 09/23/11   LOWER BACK AND RIGHT SIDE--PT HAS RIGHT URETERAL STONE   Pneumonia    hx of 2009   Pyelonephritis    Seizures ([place]  [facility] ap[facility]    [facility] ; mild   Stress incontinence    Stroke ([place]  last 2003  RESIDUAL LEFT LEG WEAKNESS--NO OTHER RESIDUAL PROBLEMS    Patient Active Problem List   Diagnosis Date Noted   Vitamin D deficiency 01/24/2018   Vulvovaginal candidiasis 01/24/2018   Hypokalemia 06/08/2017   Overactive bladder 05/18/2017   Asthma 06/22/2016   Dilated aortic root (HCC)    Sacroiliac dysfunction 10/09/2014   Chronic low back pain 09/03/2014   CVA, old, hemiparesis ([place]8/15/2016  OSA (obstructive sleep apnea) 05/15/2014   Restrictive airway disease 05/15/2014   Colitis 02/22/2014   Type 2 diabetes, uncontrolled, with renal manifestation ([place]) 01/21/2014   Chronic pain disorder 01/21/2014   Headache disorder 09/07/2013   Diabetic neuropathy ([place]) 05/23/2013   Mixed urge and stress incontinence 08/25/2011   Ureteral stricture, right 08/25/2011   Pyelonephritis 08/22/2011   Nephrolithiasis 08/22/2011   Incisional hernia 05/10/2011   Morbid obesity ([place]) 05/10/2011   Tachycardia 05/08/2011   BLINDNESS, LEGAL, [place] DEFINITION 09/14/2006   Hyperlipemia 09/13/2006   Essential hypertension 09/13/2006   MYOCARDIAL INFARCTION, HX OF 09/13/2006   GERD 09/13/2006   Seizure disorder ([place]) 09/13/2006    Past Surgical History:  Procedure Laterality Date   BRAIN HEMATOMA EVACUATION     five procedures total, first procedure when 65 months old, last at 53  years of age.   CYSTOSCOPY W/ RETROGRADES  09/24/2011   Procedure: CYSTOSCOPY WITH RETROGRADE PYELOGRAM;  Surgeon: Malka So, MD;  Location: [facility];  Service: Urology;  Laterality: Right;   CYSTOSCOPY WITH URETEROSCOPY  09/24/2011   Procedure: CYSTOSCOPY WITH URETEROSCOPY;  Surgeon: Malka So, MD;  Location: [facility];  Service: Urology;  Laterality: Right;  Balloon dilation right ureter    CYSTOSCOPY/RETROGRADE/URETEROSCOPY  08/24/2011   Procedure: CYSTOSCOPY/RETROGRADE/URETEROSCOPY;  Surgeon: Molli Hazard, MD;  Location: [facility];  Service: Urology;  Laterality: Right;  Cysto, Right retrograde Pyelogram, right stent placement.    INCISIONAL HERNIA REPAIR  05/05/2011   Procedure: LAPAROSCOPIC INCISIONAL HERNIA;  Surgeon: Rolm Bookbinder, MD;  Location: [place];  Service: General;  Laterality: N/A;   kidney stone removal     MASS EXCISION  05/05/2011   Procedure: EXCISION MASS;  Surgeon: Rolm Bookbinder, MD;  Location: [place];  Service: General;  Laterality: Right;   [facility]     RETINAL DETACHMENT SURGERY Left 1990   SHUNT REMOVAL     shunt inserted at age 78 removed at age 76    [place]    Prior to Admission medications   Medication Sig Start Date End Date Taking? Authorizing Provider  albuterol (PROAIR HFA) 108 (90 Base) MCG/ACT inhaler INHALE 2 PUFFS BY MOUTH EVERY 6 HOURS AS NEEDED FOR WHEEZING OR SHORTNESS OF BREATH 01/24/18   Hubbard Hartshorn, [name]  AMBULATORY NON FORMULARY MEDICATION Medication Name: Muscle and Joint Vanishing Scent Gel- Apply as needed to skin    [provider]  amLODipine (NORVASC) 5 MG tablet Take 1 tablet (5 mg total) by mouth daily. 10/12/18   Delsa Grana, [name]  baclofen (LIORESAL) 10 MG tablet Take 1 tablet (10 mg total) by mouth 3 (three) times daily. 07/21/18   Hubbard Hartshorn, [name]  Blood Glucose Monitoring Suppl (FIFTY50 GLUCOSE METER 2.0) w/Device KIT Accuchek 03/02/18 03/02/19  [provider]    butalbital-acetaminophen-caffeine (FIORICET, ESGIC) 50-325-40 MG tablet Take 1-2 tablets by mouth every 6 (six) hours as needed. 02/04/18 02/04/19  Earleen Newport, MD  Calcium Carbonate-Vitamin D (CALCIUM-VITAMIN D3 PO) D3 500- calcium 630 mg Take 1 capsule by mouth once daily    [provider]  cephALEXin (KEFLEX) 500 MG capsule Take 1 capsule (500 mg total) by mouth 3 (three) times daily for 10 days. 11/06/18 11/16/18  Nena Polio, MD  cimetidine (TAGAMET) 300 MG tablet Take 1 tablet (300 mg total) by mouth 2 (two) times daily. 07/21/18   Hubbard Hartshorn, [name]  clopidogrel (PLAVIX) 75 MG tablet Take 1 tablet (75 mg total) by  mouth daily. 10/07/18   Steele Sizer, MD  cycloSPORINE (RESTASIS) 0.05 % ophthalmic emulsion Place 2 drops into both eyes 2 (two) times daily.    [provider]  Dulaglutide 1.5 MG/0.5ML SOPN Inject 1.5 mg into the skin once a week. 10/20/18   Hubbard Hartshorn, [name]  DULoxetine (CYMBALTA) 20 MG capsule Take 1 capsule (20 mg total) by mouth daily. 10/12/18   Delsa Grana, [name]  ezetimibe (ZETIA) 10 MG tablet Take 1 tablet (10 mg total) by mouth daily. 10/13/18   Delsa Grana, [name]  fluorometholone (FML) 0.1 % ophthalmic suspension Place 2 drops into both eyes 2 (two) times daily.    [provider]  fluticasone (FLONASE) 50 MCG/ACT nasal spray Place 2 sprays into both nostrils daily. 10/12/18   Delsa Grana, [name]  Fluticasone-Salmeterol (ADVAIR DISKUS) 250-50 MCG/DOSE AEPB Inhale 1 puff into the lungs 2 (two) times daily. 03/28/18   Hubbard Hartshorn, [name]  gabapentin (NEURONTIN) 400 MG capsule Take 400 mg by mouth 3 (three) times daily. 07/08/18   [provider]  glucose blood (PRECISION QID TEST) test strip Accuchek brand 03/02/18 03/02/19  [provider]  insulin aspart (NOVOLOG FLEXPEN) 100 UNIT/ML FlexPen Inject into the skin. 06/08/18 06/08/19  [provider]  Insulin Glargine, 2 Unit Dial, 300 UNIT/ML SOPN Inject 120 Units  into the skin daily. 03/30/18   Hubbard Hartshorn, [name]  Insulin Pen Needle (EASY TOUCH PEN NEEDLES) 31G X 8 MM MISC Use as directed in giving insulin. Dx: E11.29 03/25/18   Hubbard Hartshorn, [name]  levETIRAcetam (KEPPRA) 500 MG tablet Take 500 mg by mouth 2 (two) times daily. 07/08/18   [provider]  losartan (COZAAR) 100 MG tablet Take 1 tablet (100 mg total) by mouth daily. 07/21/18   Hubbard Hartshorn, [name]  mirabegron ER (MYRBETRIQ) 50 MG TB24 tablet Take 1 tablet (50 mg total) by mouth daily. 10/20/18   Hubbard Hartshorn, [name]  [facility] DELICA LANCETS 59R MISC Use to check sugar three times daily as directed.Dx: E11.29; E11.40 10/21/16   Gildardo Cranker, DO  oxyCODONE-acetaminophen (PERCOCET) 5-325 MG tablet Take 1 tablet by mouth every 4 (four) hours as needed for moderate pain or severe pain. 11/06/18 11/06/19  Nena Polio, MD  pantoprazole (PROTONIX) 40 MG tablet Take 1 tablet (40 mg total) by mouth daily. 07/21/18   Hubbard Hartshorn, [name]  polyethylene glycol (MIRALAX / GLYCOLAX) packet Take 17 g by mouth daily as needed for mild constipation. 07/30/16   Lauree Chandler, NP  potassium chloride SA (KLOR-CON M20) 20 MEQ tablet Take 2 tablets (40 mEq total) by mouth daily. 10/12/18   Delsa Grana, [name]  prochlorperazine (COMPAZINE) 10 MG tablet Take 1 tablet (10 mg total) by mouth every 6 (six) hours as needed for nausea or vomiting. 11/06/18   Nena Polio, MD  simvastatin (ZOCOR) 20 MG tablet Take 1 tablet (20 mg total) by mouth at bedtime. 10/13/18   Delsa Grana, [name]  terconazole (TERAZOL 7) 0.4 % vaginal cream Place 1 applicator vaginally at bedtime. 07/21/18   Hubbard Hartshorn, [name]    Allergies Codeine and Lactose intolerance (gi)  Family History  Problem Relation Age of Onset   Uterine cancer Mother    Hypertension Mother    Cancer Mother    Brain cancer Maternal Grandmother    Hypertension Maternal Grandmother    Birth defects Daughter    Hypertension Daughter    Cirrhosis  Maternal Grandfather  ADD / ADHD Maternal Grandfather    Birth defects Maternal Grandfather    Diabetes Maternal Grandfather    Anesthesia problems Neg Hx    Colon cancer Neg Hx    Esophageal cancer Neg Hx    Pancreatic cancer Neg Hx     Social History Social History   Tobacco Use   Smoking status: Former Smoker    Packs/day: 1.00    Years: 15.00    Pack years: 15.00    Types: Cigarettes    Quit date: 01/20/1987    Years since quitting: 31.8   Smokeless tobacco: Never Used   Tobacco comment: quit more than 20years  Substance Use Topics   Alcohol use: Yes    Alcohol/week: 0.0 standard drinks    Comment: 1 glass weekly   Drug use: No    Comment: hx of marijuana use no longer uses     Review of Systems  Constitutional: No fever/chills Eyes: No visual changes. ENT: No sore throat. Cardiovascular: Denies chest pain. Respiratory: Denies shortness of breath. Gastrointestinal: abdominal pain. nausea, no vomiting. diarrhea.  No constipation. Genitourinary: Negative for dysuria. Musculoskeletal:  back pain. Skin: Negative for rash. Neurological: Negative for headaches, focal weakness   ____________________________________________   PHYSICAL EXAM:  VITAL SIGNS: ED Triage Vitals  Enc Vitals Group     BP 11/06/18 1035 (!) 154/107     Pulse Rate 11/06/18 1035 88     Resp 11/06/18 1035 20     Temp 11/06/18 1035 98.9 F (37.2 C)     Temp Source 11/06/18 1035 Oral     SpO2 11/06/18 1035 97 %     Weight 11/06/18 1036 220 lb (99.8 kg)     Height 11/06/18 1036 5' 6" (1.676 m)     Head Circumference --      Peak Flow --      Pain Score 11/06/18 1035 10     Pain Loc --      Pain Edu? --      Excl. in [place]? --     Constitutional: Alert and oriented. Well appearing and in no acute distress. Eyes: Conjunctivae are normal. I. Head: Atraumatic. Nose: No congestion/rhinnorhea. Mouth/Throat: Mucous membranes are moist.  Oropharynx non-erythematous. Neck: No  stridor. Cardiovascular: Normal rate, regular rhythm. Grossly normal heart sounds.  Good peripheral circulation. Respiratory: Normal respiratory effort.  No retractions. Lungs CTAB. Gastrointestinal: Soft diffusely tender to palpation and percussion. No distention. No abdominal bruits.  Some left CVA tenderness. Musculoskeletal: No lower extremity tenderness nor edema.  No joint effusions. Neurologic:  Normal speech and language. No gross focal neurologic deficits are appreciated. Skin:  Skin is warm, dry and intact. No rash noted.   ____________________________________________   LABS (all labs ordered are listed, but only abnormal results are displayed)  Labs Reviewed  COMPREHENSIVE METABOLIC PANEL - Abnormal; Notable for the following components:      Result Value   Glucose, Bld 142 (*)    BUN 21 (*)    AST 12 (*)    All other components within normal limits  URINALYSIS, COMPLETE (UACMP) WITH MICROSCOPIC - Abnormal; Notable for the following components:   Color, Urine YELLOW (*)    APPearance CLEAR (*)    Hgb urine dipstick SMALL (*)    Leukocytes,Ua SMALL (*)    Bacteria, UA FEW (*)    All other components within normal limits  LIPASE, BLOOD  CBC  CK   ____________________________________________  EKG   ____________________________________________  RADIOLOGY  ED MD interpretation: CT read by radiology reviewed by me shows some stool and some gas but no acute problems.  Official radiology report(s): Ct Abdomen Pelvis W Contrast  Result Date: 11/06/2018 CLINICAL DATA:  Acute abdominal pain with nausea and diarrhea EXAM: CT ABDOMEN AND PELVIS WITH CONTRAST TECHNIQUE: Multidetector CT imaging of the abdomen and pelvis was performed using the standard protocol following bolus administration of intravenous contrast. CONTRAST:  153m OMNIPAQUE IOHEXOL 300 MG/ML  SOLN COMPARISON:  12/23/2015 FINDINGS: Lower chest: No acute abnormality. Hepatobiliary: No focal liver  abnormality is seen. No gallstones, gallbladder wall thickening, or biliary dilatation. Pancreas: Unremarkable. No pancreatic ductal dilatation or surrounding inflammatory changes. Spleen: Normal in size without focal abnormality. Adrenals/Urinary Tract: Stable 2.0 x 1.5 cm right adrenal adenoma. Left adrenal gland unremarkable. Stable bilateral nonobstructing renal calculi. No hydronephrosis. No ureteral calculi. Urinary bladder is unremarkable. Stomach/Bowel: Stomach is within normal limits. Appendix appears normal. Scattered colonic diverticulosis. Moderate volume of stool throughout the colon. No evidence of bowel wall thickening, distention, or inflammatory changes. Vascular/Lymphatic: No significant vascular findings are present. No enlarged abdominal or pelvic lymph nodes. Reproductive: Status post hysterectomy. No adnexal masses. Other: No ascites or pneumoperitoneum.  Rectus diastasis. Musculoskeletal: No acute or significant osseous findings. IMPRESSION: 1. No acute abdominopelvic findings. 2. Bilateral non-obstructing renal calculi. 3. Colonic diverticulosis without evidence of diverticulitis. 4. Mild colonic stool burden. Electronically Signed   By: NDavina PokeM.D.   On: 11/06/2018 13:22   Dg Chest Portable 1 View  Result Date: 11/06/2018 CLINICAL DATA:  Upper abdominal pain. EXAM: PORTABLE CHEST 1 VIEW COMPARISON:  06/22/2016 FINDINGS: Both lungs are clear. Heart and mediastinum are within normal limits. Trachea is midline. Negative for a pneumothorax. Bone structures are unremarkable. IMPRESSION: No acute cardiopulmonary disease. Electronically Signed   By: AMarkus DaftM.D.   On: 11/06/2018 11:44    ____________________________________________   PROCEDURES  Procedure(s) performed (including Critical Care):  Procedures   ____________________________________________   INITIAL IMPRESSION / ASSESSMENT AND PLAN / ED COURSE  Patient is allergic to codeine but says she has had  morphine in [place] before without any problems.    ----------------------------------------- 1:36 PM on 11/06/2018 -----------------------------------------  Patient really does not feel any better.  Cannot find any reason for her to feel like this.  We will try and get some more pain meds and nausea medication in her be some fluids as well see if we can let her go home.   ----------------------------------------- 3:34 PM on 11/06/2018 -----------------------------------------  Dr. FJari Piggwill check on the patient in a little bit and see if she is dischargeable.       ____________________________________________   FINAL CLINICAL IMPRESSION(S) / ED DIAGNOSES  Final diagnoses:  Generalized abdominal pain  Nausea  Urinary tract infection without hematuria, site unspecified     ED Discharge Orders         Ordered    prochlorperazine (COMPAZINE) 10 MG tablet  Every 6 hours PRN     11/06/18 1447    cephALEXin (KEFLEX) 500 MG capsule  3 times daily     11/06/18 1447    oxyCODONE-acetaminophen (PERCOCET) 5-325 MG tablet  Every 4 hours PRN     11/06/18 1449           Note:  This document was prepared using Dragon voice recognition software and may include unintentional dictation errors.    MNena Polio MD 11/06/18 1534    MNena Polio MD 11/06/18 1538

## 2018-11-06 NOTE — ED Provider Notes (Signed)
4:05 PM Assumed care for off going team.   Blood pressure (!) 152/91, pulse 67, temperature 98 F (36.7 C), resp. rate 10, height 5' 6"  (1.676 m), weight 99.8 kg, SpO2 100 %.  See their HPI for full report but in brief abd pain, nausea CT negative, labs normal, possible UTI? Got compazine and zofran. Hopefully d/c on keflex and compazine.   Reevaluated patient.  CT scan was negative.  Patient's not had any vomiting since being here.  Nausea is better.  Patient feels comfortable discharge home and will come back as needed for worsening symptoms.         Vanessa Mazon, MD 11/06/18 [phone]

## 2018-11-06 NOTE — ED Notes (Signed)
Eugene Garnet, RN attempted for IV x1, this RN attempted for IV  x1.

## 2018-11-06 NOTE — ED Notes (Signed)
Pt called daughter to pick her up. Daughter states she is getting gas and will return to ER presently. Pt wheeled to lobby to wait for daughter. Pt states she can walk to car from lobby with her cane.

## 2018-11-06 NOTE — Discharge Instructions (Addendum)
Use the Compazine 1 pill 3-4 times a day as needed for nausea.  Take the Percocet 1 pill 4 times a day as needed for pain.  Use the Keflex 1 pill 3 times a day as needed for UTI.  Follow-up with your regular doctor return for increasing pain, fever or vomiting.

## 2018-11-06 NOTE — ED Notes (Signed)
Pt returned from CT °

## 2018-11-06 NOTE — ED Notes (Signed)
Patient transported to CT 

## 2018-11-07 ENCOUNTER — Emergency Department
Admission: EM | Admit: 2018-11-07 | Discharge: 2018-11-07 | Disposition: A | Discharge status: Home / Self Care | Payer: Medicare HMO | Attending: Emergency Medicine | Admitting: Emergency Medicine

## 2018-11-07 ENCOUNTER — Emergency Department: Payer: Medicare HMO

## 2018-11-07 ENCOUNTER — Other Ambulatory Visit: Payer: Self-pay

## 2018-11-07 DIAGNOSIS — M545 Low back pain: Secondary | ICD-10-CM | POA: Insufficient documentation

## 2018-11-07 DIAGNOSIS — R0789 Other chest pain: Secondary | ICD-10-CM | POA: Diagnosis not present

## 2018-11-07 DIAGNOSIS — I10 Essential (primary) hypertension: Secondary | ICD-10-CM | POA: Insufficient documentation

## 2018-11-07 DIAGNOSIS — Z20828 Contact with and (suspected) exposure to other viral communicable diseases: Secondary | ICD-10-CM | POA: Insufficient documentation

## 2018-11-07 DIAGNOSIS — Z8673 Personal history of transient ischemic attack (TIA), and cerebral infarction without residual deficits: Secondary | ICD-10-CM | POA: Diagnosis not present

## 2018-11-07 DIAGNOSIS — R079 Chest pain, unspecified: Secondary | ICD-10-CM | POA: Diagnosis not present

## 2018-11-07 DIAGNOSIS — E1140 Type 2 diabetes mellitus with diabetic neuropathy, unspecified: Secondary | ICD-10-CM | POA: Insufficient documentation

## 2018-11-07 DIAGNOSIS — J449 Chronic obstructive pulmonary disease, unspecified: Secondary | ICD-10-CM | POA: Diagnosis not present

## 2018-11-07 DIAGNOSIS — Z87891 Personal history of nicotine dependence: Secondary | ICD-10-CM | POA: Diagnosis not present

## 2018-11-07 DIAGNOSIS — Z79899 Other long term (current) drug therapy: Secondary | ICD-10-CM | POA: Insufficient documentation

## 2018-11-07 DIAGNOSIS — Z794 Long term (current) use of insulin: Secondary | ICD-10-CM | POA: Insufficient documentation

## 2018-11-07 DIAGNOSIS — R112 Nausea with vomiting, unspecified: Secondary | ICD-10-CM | POA: Insufficient documentation

## 2018-11-07 DIAGNOSIS — K2900 Acute gastritis without bleeding: Secondary | ICD-10-CM | POA: Diagnosis not present

## 2018-11-07 DIAGNOSIS — I252 Old myocardial infarction: Secondary | ICD-10-CM | POA: Diagnosis not present

## 2018-11-07 DIAGNOSIS — R1013 Epigastric pain: Secondary | ICD-10-CM | POA: Diagnosis present

## 2018-11-07 LAB — CBC
HCT: 39.8 % (ref 36.0–46.0)
Hemoglobin: 12.5 g/dL (ref 12.0–15.0)
MCH: 29.1 pg (ref 26.0–34.0)
MCHC: 31.4 g/dL (ref 30.0–36.0)
MCV: 92.8 fL (ref 80.0–100.0)
Platelets: 267 10*3/uL (ref 150–400)
RBC: 4.29 MIL/uL (ref 3.87–5.11)
RDW: 13.2 % (ref 11.5–15.5)
WBC: 6 10*3/uL (ref 4.0–10.5)
nRBC: 0 % (ref 0.0–0.2)

## 2018-11-07 LAB — COMPREHENSIVE METABOLIC PANEL
ALT: 13 U/L (ref 0–44)
AST: 13 U/L — ABNORMAL LOW (ref 15–41)
Albumin: 4.1 g/dL (ref 3.5–5.0)
Alkaline Phosphatase: 80 U/L (ref 38–126)
Anion gap: 9 (ref 5–15)
BUN: 14 mg/dL (ref 6–20)
CO2: 25 mmol/L (ref 22–32)
Calcium: 9.2 mg/dL (ref 8.9–10.3)
Chloride: 105 mmol/L (ref 98–111)
Creatinine, Ser: 1.02 mg/dL — ABNORMAL HIGH (ref 0.44–1.00)
GFR calc Af Amer: >60 mL/min (ref >60)
GFR calc non Af Amer: >60 mL/min (ref >60)
Glucose, Bld: 203 mg/dL — ABNORMAL HIGH (ref 70–99)
Potassium: 4 mmol/L (ref 3.5–5.1)
Sodium: 139 mmol/L (ref 135–145)
Total Bilirubin: 0.6 mg/dL (ref 0.3–1.2)
Total Protein: 7.9 g/dL (ref 6.5–8.1)

## 2018-11-07 LAB — SARS CORONAVIRUS 2 (TAT 6-24 HRS): SARS Coronavirus 2: NEGATIVE

## 2018-11-07 LAB — TROPONIN I (HIGH SENSITIVITY): Troponin I (High Sensitivity): 3 ng/L (ref <18)

## 2018-11-07 MED ORDER — ONDANSETRON 4 MG PO TBDP
4.0000 mg | ORAL_TABLET | Freq: Once | ORAL | Status: AC
  Administered 2018-11-07: 4 mg via ORAL

## 2018-11-07 MED ORDER — LIDOCAINE VISCOUS HCL 2 % MT SOLN
15.0000 mL | Freq: Once | OROMUCOSAL | Status: AC
  Administered 2018-11-07: 15 mL via ORAL
  Filled 2018-11-07: qty 15

## 2018-11-07 MED ORDER — ONDANSETRON 4 MG PO TBDP
ORAL_TABLET | ORAL | Status: AC
  Filled 2018-11-07: qty 1

## 2018-11-07 MED ORDER — ALUM & MAG HYDROXIDE-SIMETH 200-200-20 MG/5ML PO SUSP
30.0000 mL | Freq: Once | ORAL | Status: AC
  Administered 2018-11-07: 30 mL via ORAL
  Filled 2018-11-07: qty 30

## 2018-11-07 MED ORDER — SUCRALFATE 1 G PO TABS: 1.0000 g | ORAL_TABLET | Freq: Four times a day (QID) | ORAL | 0 refills | Status: DC

## 2018-11-07 MED ORDER — ONDANSETRON 4 MG PO TBDP: 4.0000 mg | ORAL_TABLET | Freq: Three times a day (TID) | ORAL | 0 refills | Status: DC | PRN

## 2018-11-07 NOTE — ED Notes (Addendum)
Pt alert and oriented X 4, stable for discharge. RR even and unlabored, color WNL. Discussed discharge instructions and follow up when appropriate. Instructed to follow up with ER for any life threatening symptoms or concerns that patient or family of patient may have Pt awaiting daughter, wanted to walk. Walks independently with cane.

## 2018-11-07 NOTE — ED Triage Notes (Signed)
Reports chest pain, back pain and emesis. States she was seen here in ER yesterday due to back pain and abdominal pain and discharged. Pt reports chest pain and emesis started today. Pt tearful. Pt alert and oriented X4, cooperative, RR even and unlabored, color WNL. Pt in NAD.

## 2018-11-07 NOTE — ED Provider Notes (Addendum)
[facility] Emergency Department Provider Note   ____________________________________________    I have reviewed the triage vital signs and the nursing notes.   HISTORY  Chief Complaint Back Pain, Chest Pain, and Emesis     HPI [name] is a 53 y.o. female who presents with complaints of epigastric pain, nausea and vomiting.  She came today because she had brief episode of chest discomfort after vomiting, discussed with her doctors office and they referred her to the emergency department.  Patient was here yesterday and had CT abdomen pelvis which was reassuring and had been feeling better.  She complains of some lower back pain which is relatively chronic for her.  She does have a history of diabetes and has diabetic neuropathy for which she is on Neurontin.  Denies chest pain at this time.  Past Medical History:  Diagnosis Date   Anxiety and depression 09/13/2006   Qualifier: Diagnosis of  By: Hassell Done [name], Ny[place]     Arthritis    joint pain    Asthma    COPD (chronic obstructive pulmonary disease) ([place])    chronic bronchitis    Depression    Diabetes mellitus    since age 87; type 2 IDDM   Diabetic neuropathy, painful ([place])    FEET AND HANDS   Diabetic retinopathy ([place])    Diastolic dysfunction    a.  Echo 11/17: EF 60-65%, mild LVH, no RWMA, Gr1DD, mild AI, dilated aortic root measuring 38 mm, mildly dilated ascending aorta   Dilated aortic root (HCC)    30m by echo 11/2015   Dizziness    secondary to diabetes and hypertension    Epilepsy idiopathic petit mal ([place]    last seizure 2012;controlled w/ topomax   GERD (gastroesophageal reflux disease)    Glaucoma    NOT ON ANY EYE DROPS    Headache(784.0)    migraines    Heart murmur    born with    History of stress test    a. 11/17: Normal perfusion, EF 53%, normal study   Hx of blood clots    hematomas removed from left side of brain from 125moto [id number]    Hyperlipidemia    Hypertension    Legally blind    left eye    MIGRAINE HEADACHE 09/14/2006   Qualifier: Diagnosis of  By: MarHassell DoneP, Nykedtra     [facility]  [facility]  Patient reported history of without objective documentation   Nephrolithiasis    frequent urination , urination at nite  PT SEEN IN ER 09/22/11 FOR BACK AND RT SIDED PAIN--HAS KNOWN STONE RT URETER AND UA IN ER SHOWED UTI   Pain 09/23/11   LOWER BACK AND RIGHT SIDE--PT HAS RIGHT URETERAL STONE   Pneumonia    hx of 2009   Pyelonephritis    Seizures ([facility]  Sleep ap[facility]    [facility] ; mild   Stress incontinence    Stroke ([facility]  last 2003  RESIDUAL LEFT LEG WEAKNESS--NO OTHER RESIDUAL PROBLEMS    Patient Active Problem List   Diagnosis Date Noted   Vitamin D deficiency 01/24/2018   Vulvovaginal candidiasis 01/24/2018   Hypokalemia 06/08/2017   Overactive bladder 05/18/2017   Asthma 06/22/2016   Dilated aortic root (HCC)    Sacroiliac dysfunction 10/09/2014   Chronic low back pain 09/03/2014   CVA, old, hemiparesis ([facility] [place]8/15/2016  OSA (obstructive sleep apnea) 05/15/2014   Restrictive airway disease 05/15/2014   Colitis 02/22/2014   Type 2 diabetes, uncontrolled, with renal manifestation ([place]) 01/21/2014   Chronic pain disorder 01/21/2014   Headache disorder 09/07/2013   Diabetic neuropathy ([place]) 05/23/2013   Mixed urge and stress incontinence 08/25/2011   Ureteral stricture, right 08/25/2011   Pyelonephritis 08/22/2011   Nephrolithiasis 08/22/2011   Incisional hernia 05/10/2011   Morbid obesity ([place]) 05/10/2011   Tachycardia 05/08/2011   BLINDNESS, LEGAL, [place] DEFINITION 09/14/2006   Hyperlipemia 09/13/2006   Essential hypertension 09/13/2006   MYOCARDIAL INFARCTION, HX OF 09/13/2006   GERD 09/13/2006   Seizure disorder ([place]) 09/13/2006    Past Surgical History:  Procedure Laterality Date    BRAIN HEMATOMA EVACUATION     five procedures total, first procedure when 24 months old, last at 53 years of age.   CYSTOSCOPY W/ RETROGRADES  09/24/2011   Procedure: CYSTOSCOPY WITH RETROGRADE PYELOGRAM;  Surgeon: Malka So, MD;  Location: [facility];  Service: Urology;  Laterality: Right;   CYSTOSCOPY WITH URETEROSCOPY  09/24/2011   Procedure: CYSTOSCOPY WITH URETEROSCOPY;  Surgeon: Malka So, MD;  Location: [facility];  Service: Urology;  Laterality: Right;  Balloon dilation right ureter    CYSTOSCOPY/RETROGRADE/URETEROSCOPY  08/24/2011   Procedure: CYSTOSCOPY/RETROGRADE/URETEROSCOPY;  Surgeon: Molli Hazard, MD;  Location: [facility];  Service: Urology;  Laterality: Right;  Cysto, Right retrograde Pyelogram, right stent placement.    INCISIONAL HERNIA REPAIR  05/05/2011   Procedure: LAPAROSCOPIC INCISIONAL HERNIA;  Surgeon: Rolm Bookbinder, MD;  Location: [place];  Service: General;  Laterality: N/A;   kidney stone removal     MASS EXCISION  05/05/2011   Procedure: EXCISION MASS;  Surgeon: Rolm Bookbinder, MD;  Location: [place];  Service: General;  Laterality: Right;   [facility]     RETINAL DETACHMENT SURGERY Left 1990   SHUNT REMOVAL     shunt inserted at age 80 removed at age 4    [place]    Prior to Admission medications   Medication Sig Start Date End Date Taking? Authorizing Provider  albuterol (PROAIR HFA) 108 (90 Base) MCG/ACT inhaler INHALE 2 PUFFS BY MOUTH EVERY 6 HOURS AS NEEDED FOR WHEEZING OR SHORTNESS OF BREATH 01/24/18   Hubbard Hartshorn, [name]  AMBULATORY NON FORMULARY MEDICATION Medication Name: Muscle and Joint Vanishing Scent Gel- Apply as needed to skin    [provider]  amLODipine (NORVASC) 5 MG tablet Take 1 tablet (5 mg total) by mouth daily. 10/12/18   Delsa Grana, [name]  baclofen (LIORESAL) 10 MG tablet Take 1 tablet (10 mg total) by mouth 3 (three) times daily. 07/21/18   Hubbard Hartshorn, [name]  Blood Glucose Monitoring Suppl (FIFTY50 GLUCOSE  METER 2.0) w/Device KIT Accuchek 03/02/18 03/02/19  [provider]  butalbital-acetaminophen-caffeine (FIORICET, ESGIC) 50-325-40 MG tablet Take 1-2 tablets by mouth every 6 (six) hours as needed. 02/04/18 02/04/19  Earleen Newport, MD  Calcium Carbonate-Vitamin D (CALCIUM-VITAMIN D3 PO) D3 500- calcium 630 mg Take 1 capsule by mouth once daily    [provider]  cephALEXin (KEFLEX) 500 MG capsule Take 1 capsule (500 mg total) by mouth 3 (three) times daily for 10 days. 11/06/18 11/16/18  Nena Polio, MD  cimetidine (TAGAMET) 300 MG tablet Take 1 tablet (300 mg total) by mouth 2 (two) times daily. 07/21/18   Hubbard Hartshorn, [name]  clopidogrel (PLAVIX) 75 MG tablet Take 1 tablet (75 mg total) by mouth  daily. 10/07/18   Steele Sizer, MD  cycloSPORINE (RESTASIS) 0.05 % ophthalmic emulsion Place 2 drops into both eyes 2 (two) times daily.    [provider]  Dulaglutide 1.5 MG/0.5ML SOPN Inject 1.5 mg into the skin once a week. 10/20/18   Hubbard Hartshorn, [name]  DULoxetine (CYMBALTA) 20 MG capsule Take 1 capsule (20 mg total) by mouth daily. 10/12/18   Delsa Grana, [name]  ezetimibe (ZETIA) 10 MG tablet Take 1 tablet (10 mg total) by mouth daily. 10/13/18   Delsa Grana, [name]  fluorometholone (FML) 0.1 % ophthalmic suspension Place 2 drops into both eyes 2 (two) times daily.    [provider]  fluticasone (FLONASE) 50 MCG/ACT nasal spray Place 2 sprays into both nostrils daily. 10/12/18   Delsa Grana, [name]  Fluticasone-Salmeterol (ADVAIR DISKUS) 250-50 MCG/DOSE AEPB Inhale 1 puff into the lungs 2 (two) times daily. 03/28/18   Hubbard Hartshorn, [name]  gabapentin (NEURONTIN) 400 MG capsule Take 400 mg by mouth 3 (three) times daily. 07/08/18   [provider]  glucose blood (PRECISION QID TEST) test strip Accuchek brand 03/02/18 03/02/19  [provider]  insulin aspart (NOVOLOG FLEXPEN) 100 UNIT/ML FlexPen Inject into the skin. 06/08/18 06/08/19  [provider]  Insulin Glargine, 2 Unit Dial, 300 UNIT/ML SOPN Inject 120 Units into the skin daily. 03/30/18   Hubbard Hartshorn, [name]  Insulin Pen Needle (EASY TOUCH PEN NEEDLES) 31G X 8 MM MISC Use as directed in giving insulin. Dx: E11.29 03/25/18   Hubbard Hartshorn, [name]  levETIRAcetam (KEPPRA) 500 MG tablet Take 500 mg by mouth 2 (two) times daily. 07/08/18   [provider]  losartan (COZAAR) 100 MG tablet Take 1 tablet (100 mg total) by mouth daily. 07/21/18   Hubbard Hartshorn, [name]  mirabegron ER (MYRBETRIQ) 50 MG TB24 tablet Take 1 tablet (50 mg total) by mouth daily. 10/20/18   Hubbard Hartshorn, [name]  ondansetron (ZOFRAN ODT) 4 MG disintegrating tablet Take 1 tablet (4 mg total) by mouth every 8 (eight) hours as needed. 11/07/18   Lavonia Drafts, MD  [facility] DELICA LANCETS 06Y MISC Use to check sugar three times daily as directed.Dx: E11.29; E11.40 10/21/16   Gildardo Cranker, DO  oxyCODONE-acetaminophen (PERCOCET) 5-325 MG tablet Take 1 tablet by mouth every 4 (four) hours as needed for moderate pain or severe pain. 11/06/18 11/06/19  Nena Polio, MD  pantoprazole (PROTONIX) 40 MG tablet Take 1 tablet (40 mg total) by mouth daily. 07/21/18   Hubbard Hartshorn, [name]  polyethylene glycol (MIRALAX / GLYCOLAX) packet Take 17 g by mouth daily as needed for mild constipation. 07/30/16   Lauree Chandler, NP  potassium chloride SA (KLOR-CON M20) 20 MEQ tablet Take 2 tablets (40 mEq total) by mouth daily. 10/12/18   Delsa Grana, [name]  prochlorperazine (COMPAZINE) 10 MG tablet Take 1 tablet (10 mg total) by mouth every 6 (six) hours as needed for nausea or vomiting. 11/06/18   Nena Polio, MD  simvastatin (ZOCOR) 20 MG tablet Take 1 tablet (20 mg total) by mouth at bedtime. 10/13/18   Delsa Grana, [name]  sucralfate (CARAFATE) 1 g tablet Take 1 tablet (1 g total) by mouth 4 (four) times daily for 10 days. 11/07/18 11/17/18  Lavonia Drafts, MD  terconazole (TERAZOL 7) 0.4 % vaginal cream Place 1 applicator  vaginally at bedtime. 07/21/18   Hubbard Hartshorn, [name]     Allergies Codeine and Lactose intolerance (gi)  Family  History  Problem Relation Age of Onset   Uterine cancer Mother    Hypertension Mother    Cancer Mother    Brain cancer Maternal Grandmother    Hypertension Maternal Grandmother    Birth defects Daughter    Hypertension Daughter    Cirrhosis Maternal Grandfather    ADD / ADHD Maternal Grandfather    Birth defects Maternal Grandfather    Diabetes Maternal Grandfather    Anesthesia problems Neg Hx    Colon cancer Neg Hx    Esophageal cancer Neg Hx    Pancreatic cancer Neg Hx     Social History Social History   Tobacco Use   Smoking status: Former Smoker    Packs/day: 1.00    Years: 15.00    Pack years: 15.00    Types: Cigarettes    Quit date: 01/20/1987    Years since quitting: 31.8   Smokeless tobacco: Never Used   Tobacco comment: quit more than 20years  Substance Use Topics   Alcohol use: Yes    Alcohol/week: 0.0 standard drinks    Comment: 1 glass weekly   Drug use: No    Comment: hx of marijuana use no longer uses     Review of Systems  Constitutional: No chills Eyes: No visual changes.  ENT: No sore throat. Cardiovascular: As above Respiratory: Denies shortness of breath. Gastrointestinal: As above Genitourinary: Negative for dysuria.  No frequency Musculoskeletal: As above Skin: Negative for rash. Neurological: Negative for headaches or weakness   ____________________________________________   PHYSICAL EXAM:  VITAL SIGNS: ED Triage Vitals  Enc Vitals Group     BP 11/07/18 0925 (!) 168/91     Pulse Rate 11/07/18 0925 95     Resp 11/07/18 0925 18     Temp 11/07/18 0925 99.6 F (37.6 C)     Temp Source 11/07/18 0925 Oral     SpO2 11/07/18 0925 100 %     Weight 11/07/18 0926 100.2 kg (221 lb)     Height 11/07/18 0926 1.676 m (5' 6" )     Head Circumference --      Peak Flow --      Pain Score 11/07/18 0925 10      Pain Loc --      Pain Edu? --      Excl. in [place]? --     Constitutional: Alert and oriented. No acute distress.  Nose: No congestion/rhinnorhea. Mouth/Throat: Mucous membranes are moist.    Cardiovascular: Normal rate, regular rhythm. Grossly normal heart sounds.  Good peripheral circulation. Respiratory: Normal respiratory effort.  No retractions. Lungs CTAB. Gastrointestinal: Soft and nontender. No distention.  No CVA tenderness.  Reassuring exam  Musculoskeletal: Warm and well perfused Neurologic:  Normal speech and language. No gross focal neurologic deficits are appreciated.  Skin:  Skin is warm, dry and intact. No rash noted. Psychiatric: Mood and affect are normal. Speech and behavior are normal.  ____________________________________________   LABS (all labs ordered are listed, but only abnormal results are displayed)  Labs Reviewed  COMPREHENSIVE METABOLIC PANEL - Abnormal; Notable for the following components:      Result Value   Glucose, Bld 203 (*)    Creatinine, Ser 1.02 (*)    AST 13 (*)    All other components within normal limits  CBC  TROPONIN I (HIGH SENSITIVITY)   ____________________________________________  EKG ED ECG REPORT I, Lavonia Drafts, the attending physician, personally viewed and interpreted this ECG.  Date: 11/07/2018  Rhythm: normal sinus  rhythm QRS Axis: normal Intervals: normal ST/T Wave abnormalities: normal Narrative Interpretation: no evidence of acute ischemia  ____________________________________________  RADIOLOGY  Chest x-ray unremarkable ____________________________________________   PROCEDURES  Procedure(s) performed: No  Procedures   Critical Care performed: No ____________________________________________   INITIAL IMPRESSION / ASSESSMENT AND PLAN / ED COURSE  Pertinent labs & imaging results that were available during my care of the patient were reviewed by me and considered in my medical decision making  (see chart for details).  Patient presents with primary concern of chest discomfort after vomiting, which lasted less than 5 minutes.  Feels well currently although still has some burning in her epigastrium.  EKG is quite reassuring, troponin is normal.  Symptoms started at 6 AM.  Not consistent with ACS no tearing chest pain radiating to the back or vice versa, she describes chronic low back pain.  Not consistent with PE has no pleurisy or shortness of breath.  I suspect her vomiting is related to her gastritis.  She has not gotten her medications yet because they are being mailed to her.  We will start her on nausea medication, Carafate, referral to GI.  Patient with mild elevation in temperature, given coronavirus pandemic will send coronavirus swab for possible atypical symptoms       ____________________________________________   FINAL CLINICAL IMPRESSION(S) / ED DIAGNOSES  Final diagnoses:  Acute gastritis without hemorrhage, unspecified gastritis type     [name] was evaluated in Emergency Department on 11/07/2018 for the symptoms described in the history of present illness. She was evaluated in the context of the global COVID-19 pandemic, which necessitated consideration that the patient might be at risk for infection with the SARS-CoV-2 virus that causes COVID-19. Institutional protocols and algorithms that pertain to the evaluation of patients at risk for COVID-19 are in a state of rapid change based on information released by regulatory bodies including the CDC and federal and state organizations. These policies and algorithms were followed during the patient's care in the ED.    Note:  This document was prepared using Dragon voice recognition software and may include unintentional dictation errors.   Lavonia Drafts, MD 11/07/18 1330    Lavonia Drafts, MD 11/07/18 1331

## 2018-11-10 ENCOUNTER — Telehealth: Payer: Self-pay | Admitting: Emergency Medicine

## 2018-11-10 MED ORDER — CEPHALEXIN 500 MG PO CAPS: 500.0000 mg | ORAL_CAPSULE | Freq: Three times a day (TID) | ORAL | 0 refills | Status: DC

## 2018-11-10 NOTE — ED Notes (Addendum)
[facility] mail order pharmacy called to indicate they have prescriptions from 11/06/18 visit and that they will be unable to get these to the [facility] for a week.  Per dr Archie Balboa he will send new rx for keflex to [facility], and we will cancel the other medications.    I called [facility] back and left message with ref # to cancel all the meds from 10/18 ED visit with dr Cinda Quest.  I called [facility] and left message.  It is 5 pm, so I called [facility] and left message that e script was coming, but I have not actually spoken to the patient. I gave patient number and asked that they call patient as well.

## 2018-11-10 NOTE — Telephone Encounter (Signed)
I did not evaluate the patient, however, apparently prescriptions for visit on 10/18 were sent to a pharmacy in [place]. Will send prescription for keflex to local pharmacy.

## 2018-11-14 ENCOUNTER — Ambulatory Visit (INDEPENDENT_AMBULATORY_CARE_PROVIDER_SITE_OTHER): Payer: Medicare HMO | Admitting: Family Medicine

## 2018-11-14 ENCOUNTER — Other Ambulatory Visit: Payer: Self-pay

## 2018-11-14 ENCOUNTER — Encounter: Payer: Self-pay | Admitting: Family Medicine

## 2018-11-14 VITALS — BP 130/76 | HR 83 | Temp 97.8°F | Resp 16 | Ht 66.0 in | Wt 226.7 lb

## 2018-11-14 DIAGNOSIS — E782 Mixed hyperlipidemia: Secondary | ICD-10-CM

## 2018-11-14 DIAGNOSIS — R1084 Generalized abdominal pain: Secondary | ICD-10-CM | POA: Diagnosis not present

## 2018-11-14 DIAGNOSIS — K219 Gastro-esophageal reflux disease without esophagitis: Secondary | ICD-10-CM | POA: Diagnosis not present

## 2018-11-14 DIAGNOSIS — M791 Myalgia, unspecified site: Secondary | ICD-10-CM

## 2018-11-14 MED ORDER — ONDANSETRON 4 MG PO TBDP: 4.0000 mg | ORAL_TABLET | Freq: Three times a day (TID) | ORAL | 0 refills | Status: DC | PRN

## 2018-11-14 NOTE — Progress Notes (Signed)
Name: [name]   MRN: [id number]    DOB: Oct 13, 1965   Date:11/14/2018       Progress Note  Subjective  Chief Complaint  Chief Complaint  Patient presents with  . Follow-up    ER  . Muscle Pain    HPI  PT was seen in the ER 2 days in a row - 11/06/2018 and 11/07/2018 for abdominal pain and chronic low back pain and UTI.  - Epigastric/Abominal Pain: Troponins were negative; EKG was reassuring for no ACS at that time; was treated with Carafate and referred to GI (still also taking tagamet).  She states the Carafate is not helping her heartburn right now.  She is still having nausea and occasional regurgitation - taking Zofran PRN. CT abdomen pelvis reassuring from GI standpoint.   - UTI: she is completing a Keflex Rx for UTI.  She is still having very mild dysuria; no frank hematuria, fevers/chills. CT abdomen pelvis did find some non-obstructing kidney stones bilaterally.  - Myalgia: Started around 10/07/2018.  Bilateral low back pain; accompanied by myalgias in legs, arms, chest; feels the pain has gotten progressively worse.  As above, EKG and Troponins were negative and reassuring in the ER.  Was seen for myalgias by Florina Ou in our clinic 10/12/2018 had elevated CK and sed rate; her repeat CK in the ER was normal.  After the 10/12/2018 labs, the statin dose was lowered and Zetia was added to see if this improved things.  She notes her neurologist did increase her gabapentin and this has not improved her pain; was also given percocet from the ER which is not helping her.  She does have upcoming pain management appointment (11/29/2018 with Dr. Holley Raring).  She is taking Cymbalta, Baclofen, gabapentin all without relief of her pain.  Labs reviewed - no anemia; CMP mostly unremarkable.  Patient Active Problem List   Diagnosis Date Noted  . Vitamin D deficiency 01/24/2018  . Vulvovaginal candidiasis 01/24/2018  . Hypokalemia 06/08/2017  . Overactive bladder 05/18/2017  . Asthma  06/22/2016  . Dilated aortic root ([place])   . Sacroiliac dysfunction 10/09/2014  . Chronic low back pain 09/03/2014  . CVA, old, hemiparesis ([place]) 09/03/2014  . OSA (obstructive sleep apnea) 05/15/2014  . Restrictive airway disease 05/15/2014  . Colitis 02/22/2014  . Type 2 diabetes, uncontrolled, with renal manifestation ([place]) 01/21/2014  . Chronic pain disorder 01/21/2014  . Headache disorder 09/07/2013  . Diabetic neuropathy ([place]) 05/23/2013  . Mixed urge and stress incontinence 08/25/2011  . Ureteral stricture, right 08/25/2011  . Pyelonephritis 08/22/2011  . Nephrolithiasis 08/22/2011  . Incisional hernia 05/10/2011  . Morbid obesity ([place]) 05/10/2011  . Tachycardia 05/08/2011  . BLINDNESS, [place] DEFINITION 09/14/2006  . Hyperlipemia 09/13/2006  . Essential hypertension 09/13/2006  . MYOCARDIAL INFARCTION, HX OF 09/13/2006  . GERD 09/13/2006  . Seizure disorder ([place]) 09/13/2006    Past Surgical History:  Procedure Laterality Date  . BRAIN HEMATOMA EVACUATION     five procedures total, first procedure when 14 months old, last at 53 years of age.  . CYSTOSCOPY W/ RETROGRADES  09/24/2011   Procedure: CYSTOSCOPY WITH RETROGRADE PYELOGRAM;  Surgeon: Malka So, MD;  Location: [facility];  Service: Urology;  Laterality: Right;  . CYSTOSCOPY WITH URETEROSCOPY  09/24/2011   Procedure: CYSTOSCOPY WITH URETEROSCOPY;  Surgeon: Malka So, MD;  Location: [facility];  Service: Urology;  Laterality: Right;  Balloon dilation right ureter   . CYSTOSCOPY/RETROGRADE/URETEROSCOPY  08/24/2011  Procedure: CYSTOSCOPY/RETROGRADE/URETEROSCOPY;  Surgeon: Molli Hazard, MD;  Location: [facility];  Service: Urology;  Laterality: Right;  Cysto, Right retrograde Pyelogram, right stent placement.   Fatima Blank HERNIA REPAIR  05/05/2011   Procedure: LAPAROSCOPIC INCISIONAL HERNIA;  Surgeon: Rolm Bookbinder, MD;  Location: [facility];  Service: General;  Laterality: N/A;  . kidney stone removal    . MASS  EXCISION  05/05/2011   Procedure: EXCISION MASS;  Surgeon: Rolm Bookbinder, MD;  Location: [facility];  Service: General;  Laterality: Right;  . PCNL    . RETINAL DETACHMENT SURGERY Left 1990  . SHUNT REMOVAL     shunt inserted at age 59 removed at age 69   . VAGINAL HYSTERECTOMY  1996    Family History  Problem Relation Age of Onset  . Uterine cancer Mother   . Hypertension Mother   . Cancer Mother   . Brain cancer Maternal Grandmother   . Hypertension Maternal Grandmother   . Birth defects Daughter   . Hypertension Daughter   . Cirrhosis Maternal Grandfather   . ADD / ADHD Maternal Grandfather   . Birth defects Maternal Grandfather   . Diabetes Maternal Grandfather   . Anesthesia problems Neg Hx   . Colon cancer Neg Hx   . Esophageal cancer Neg Hx   . Pancreatic cancer Neg Hx     Social History   Socioeconomic History  . Marital status: Widowed    Spouse name: Not on file  . Number of children: 2  . Years of education: Not on file  . Highest education level: Not on file  Occupational History  . Occupation: disabled  Social Needs  . Financial resource strain: Not on file  . Food insecurity    Worry: Never true    Inability: Never true  . Transportation needs    Medical: No    Non-medical: No  Tobacco Use  . Smoking status: Former Smoker    Packs/day: 1.00    Years: 15.00    Pack years: 15.00    Types: Cigarettes    Quit date: 01/20/1987    Years since quitting: 31.8  . Smokeless tobacco: Never Used  . Tobacco comment: quit more than 20years  Substance and Sexual Activity  . Alcohol use: Yes    Alcohol/week: 0.0 standard drinks    Comment: 1 glass weekly  . Drug use: No    Comment: hx of marijuana use no longer uses   . Sexual activity: Yes    Partners: Male    Birth control/protection: Surgical  Lifestyle  . Physical activity    Days per week: 2 days    Minutes per session: 30 min  . Stress: Only a little  Relationships  . Social connections    Talks  on phone: More than three times a week    Gets together: More than three times a week    Attends religious service: More than 4 times per year    Active member of club or organization: Yes    Attends meetings of clubs or organizations: More than 4 times per year    Relationship status: Widowed  . Intimate partner violence    Fear of current or ex partner: No    Emotionally abused: No    Physically abused: No    Forced sexual activity: No  Other Topics Concern  . Not on file  Social History Narrative  . Not on file     Current Outpatient Medications:  .  albuterol (PROAIR HFA) 108 (90 Base) MCG/ACT inhaler, INHALE 2 PUFFS BY MOUTH EVERY 6 HOURS AS NEEDED FOR WHEEZING OR SHORTNESS OF BREATH, Disp: 3 Inhaler, Rfl: 3 .  AMBULATORY NON FORMULARY MEDICATION, Medication Name: Muscle and Joint Vanishing Scent Gel- Apply as needed to skin, Disp: , Rfl:  .  amLODipine (NORVASC) 5 MG tablet, Take 1 tablet (5 mg total) by mouth daily., Disp: 90 tablet, Rfl: 3 .  baclofen (LIORESAL) 10 MG tablet, Take 1 tablet (10 mg total) by mouth 3 (three) times daily., Disp: 90 tablet, Rfl: 6 .  Blood Glucose Monitoring Suppl (FIFTY50 GLUCOSE METER 2.0) w/Device KIT, Accuchek, Disp: , Rfl:  .  butalbital-acetaminophen-caffeine (FIORICET, ESGIC) 50-325-40 MG tablet, Take 1-2 tablets by mouth every 6 (six) hours as needed., Disp: 20 tablet, Rfl: 0 .  Calcium Carbonate-Vitamin D (CALCIUM-VITAMIN D3 PO), D3 500- calcium 630 mg Take 1 capsule by mouth once daily, Disp: , Rfl:  .  cephALEXin (KEFLEX) 500 MG capsule, Take 1 capsule (500 mg total) by mouth 3 (three) times daily., Disp: 30 capsule, Rfl: 0 .  cimetidine (TAGAMET) 300 MG tablet, Take 1 tablet (300 mg total) by mouth 2 (two) times daily., Disp: 180 tablet, Rfl: 1 .  clopidogrel (PLAVIX) 75 MG tablet, Take 1 tablet (75 mg total) by mouth daily., Disp: 90 tablet, Rfl: 0 .  cycloSPORINE (RESTASIS) 0.05 % ophthalmic emulsion, Place 2 drops into both eyes 2 (two)  times daily., Disp: , Rfl:  .  Dulaglutide 1.5 MG/0.5ML SOPN, Inject 1.5 mg into the skin once a week., Disp: 12 pen, Rfl: 3 .  DULoxetine (CYMBALTA) 20 MG capsule, Take 1 capsule (20 mg total) by mouth daily., Disp: 90 capsule, Rfl: 1 .  ezetimibe (ZETIA) 10 MG tablet, Take 1 tablet (10 mg total) by mouth daily., Disp: 90 tablet, Rfl: 0 .  fluorometholone (FML) 0.1 % ophthalmic suspension, Place 2 drops into both eyes 2 (two) times daily., Disp: , Rfl:  .  fluticasone (FLONASE) 50 MCG/ACT nasal spray, Place 2 sprays into both nostrils daily., Disp: 16 g, Rfl: 3 .  Fluticasone-Salmeterol (ADVAIR DISKUS) 250-50 MCG/DOSE AEPB, Inhale 1 puff into the lungs 2 (two) times daily., Disp: 1 each, Rfl: 3 .  gabapentin (NEURONTIN) 400 MG capsule, Take 400 mg by mouth 3 (three) times daily., Disp: , Rfl:  .  glucose blood (PRECISION QID TEST) test strip, Accuchek brand, Disp: , Rfl:  .  insulin aspart (NOVOLOG FLEXPEN) 100 UNIT/ML FlexPen, Inject into the skin., Disp: , Rfl:  .  Insulin Glargine, 2 Unit Dial, 300 UNIT/ML SOPN, Inject 120 Units into the skin daily., Disp: 15 mL, Rfl: 1 .  Insulin Pen Needle (EASY TOUCH PEN NEEDLES) 31G X 8 MM MISC, Use as directed in giving insulin. Dx: E11.29, Disp: 400 each, Rfl: 3 .  ondansetron (ZOFRAN ODT) 4 MG disintegrating tablet, Take 1 tablet (4 mg total) by mouth every 8 (eight) hours as needed., Disp: 20 tablet, Rfl: 0 .  ONETOUCH DELICA LANCETS 14E MISC, Use to check sugar three times daily as directed.Dx: E11.29; E11.40, Disp: 300 each, Rfl: 3 .  oxyCODONE-acetaminophen (PERCOCET) 5-325 MG tablet, Take 1 tablet by mouth every 4 (four) hours as needed for moderate pain or severe pain., Disp: 6 tablet, Rfl: 0 .  pantoprazole (PROTONIX) 40 MG tablet, Take 1 tablet (40 mg total) by mouth daily., Disp: 90 tablet, Rfl: 0 .  polyethylene glycol (MIRALAX / GLYCOLAX) packet, Take 17 g by mouth daily as needed for  mild constipation., Disp: 100 each, Rfl: 3 .  potassium  chloride SA (KLOR-CON M20) 20 MEQ tablet, Take 2 tablets (40 mEq total) by mouth daily., Disp: 60 tablet, Rfl: 0 .  prochlorperazine (COMPAZINE) 10 MG tablet, Take 1 tablet (10 mg total) by mouth every 6 (six) hours as needed for nausea or vomiting., Disp: 30 tablet, Rfl: 0 .  simvastatin (ZOCOR) 20 MG tablet, Take 1 tablet (20 mg total) by mouth at bedtime., Disp: 90 tablet, Rfl: 0 .  sucralfate (CARAFATE) 1 g tablet, Take 1 tablet (1 g total) by mouth 4 (four) times daily for 10 days., Disp: 40 tablet, Rfl: 0 .  terconazole (TERAZOL 7) 0.4 % vaginal cream, Place 1 applicator vaginally at bedtime., Disp: 45 g, Rfl: 0 .  levETIRAcetam (KEPPRA) 500 MG tablet, Take 500 mg by mouth 2 (two) times daily., Disp: , Rfl:  .  losartan (COZAAR) 100 MG tablet, Take 1 tablet (100 mg total) by mouth daily., Disp: 90 tablet, Rfl: 3 .  mirabegron ER (MYRBETRIQ) 50 MG TB24 tablet, Take 1 tablet (50 mg total) by mouth daily., Disp: 90 tablet, Rfl: 1  Allergies  Allergen Reactions  . Codeine Swelling and Other (See Comments)    Swelling and burning of mouth (inside)  . Lactose Intolerance ([place]) Nausea And Vomiting    I personally reviewed active problem list, medication list, allergies, health maintenance, notes from last encounter, lab results with the patient/caregiver today.   ROS  Constitutional: Negative for fever or weight change.  Respiratory: Negative for cough and shortness of breath.   Cardiovascular: Negative for chest pain or palpitations.  Gastrointestinal: Positive for abdominal pain, no bowel changes. No blood in stool. Musculoskeletal: Negative for gait problem or joint swelling. Ongoing Myalgias as above. Skin: Negative for rash.  Neurological: Negative for dizziness or headache.  No other specific complaints in a complete review of systems (except as listed in HPI above).  Objective  Vitals:   11/14/18 0905  BP: 130/76  Pulse: 83  Resp: 16  Temp: 97.8 F (36.6 C)  TempSrc: Oral   SpO2: 99%  Weight: 226 lb 11.2 oz (102.8 kg)  Height: _0  (1.676 m)    Body mass index is 36.59 kg/m.  Physical Exam Constitutional: Patient appears well-developed and well-nourished. No distress.  HENT: Head: Normocephalic and atraumatic.  Eyes: Conjunctivae and EOM are normal. No scleral icterus.  Neck: Normal range of motion. Neck supple. No JVD present.  Cardiovascular: Normal rate, regular rhythm and normal heart sounds.  No murmur heard. No BLE edema. Pulmonary/Chest: Effort normal and breath sounds normal. No respiratory distress. Abdominal: Soft. Bowel sounds are normal, no distension. There is no tenderness. No masses. Musculoskeletal: Normal range of motion, no joint effusions. No gross deformities Neurological: Pt is alert and oriented to person, place, and time. No cranial nerve deficit. Coordination, balance, strength, speech and gait are normal.  Skin: Skin is warm and dry. No rash noted. No erythema.  Psychiatric: Patient has a normal mood and affect. behavior is normal. Judgment and thought content normal.   No results found for this or any previous visit (from the past 72 hour(s)).   PHQ2/9: Depression screen [facility] 2/9 11/14/2018 10/12/2018 07/21/2018 06/23/2018 01/24/2018  Decreased Interest 0 0 0 0 0  Down, Depressed, Hopeless 0 0 0 0 0  PHQ - 2 Score 0 0 0 0 0  Altered sleeping 0 0 0 0 0  Tired, decreased energy 0 0 0 0 0  Change  in appetite 0 0 0 0 0  Feeling bad or failure about yourself  0 0 0 0 0  Trouble concentrating 0 0 0 0 0  Moving slowly or fidgety/restless 0 0 0 0 0  Suicidal thoughts 0 - 0 0 0  PHQ-9 Score 0 0 0 0 0  Difficult doing work/chores Not difficult at all Not difficult at all Not difficult at all Not difficult at all Not difficult at all  Some recent data might be hidden   PHQ-2/9 Result is negative.    Fall Risk: Fall Risk  11/14/2018 10/12/2018 07/21/2018 06/23/2018 01/24/2018  Falls in the past year? _0 0 0  Number falls in past yr: _1 0 0  Injury with Fall? 0 1 1 0 0  Risk for fall due to : History of fall(s) - - - -  Risk for fall due to: Comment - - - - -  Follow up - - - - -    Assessment & Plan  1. Gastroesophageal reflux disease without esophagitis - Continue Carafate and Cimetidine; dietary changes discussed in detail. - Ambulatory referral to Gastroenterology  2. Generalized abdominal pain - Ambulatory referral to Gastroenterology  3. Mixed hyperlipidemia - CK (Creatine Kinase)  4. Myalgia - We will recheck some labs and add additional laboratory findings.  STOP Simvastatin completel y for 2 weeks to see if things improve.  She has upcoming appt with Pain management as well.  Will check back in 2 weeks. - Sedimentation rate - C-reactive protein - CK (Creatine Kinase) - ANA,IFA RA Diag Pnl w/rflx Tit/Patn

## 2018-11-14 NOTE — Patient Instructions (Addendum)
STOP your simvastatin for 2 weeks and send me a message about your pain in 2 weeks - just to let me know if you are improving or not.  To help with reflux: - Elevate your head of bed - either with a few extra pillows, a wedge pillow, or by placing two bed risers under the head of bed posts. - Avoid the following foods: citrus, fatty foods, chocolate, peppermint, and excessive alcohol, along with sodas, orange juice (acidic drinks) - Stop eating at least 3 hours before going to bed, minimize naps/laying down after eating. - No smoking. - If you are overweight or obese, exercising and losing weight will also help your symptoms. - Caution: prolonged use of proton pump inhibitors like omeprazole (Prilosec), pantoprazole (Protonix), esomeprazole (Nexium), and others like Dexilant and Aciphex may increase your risk of pneumonia, Clostridium difficile colitis, osteoporosis, anemia and other health complications

## 2018-11-16 LAB — ANA,IFA RA DIAG PNL W/RFLX TIT/PATN
Anti Nuclear Antibody (ANA): NEGATIVE
Cyclic Citrullin Peptide Ab: 19 UNITS
Rheumatoid fact SerPl-aCnc: <14 IU/mL (ref <14)

## 2018-11-16 LAB — SEDIMENTATION RATE: Sed Rate: 29 mm/h (ref 0–30)

## 2018-11-16 LAB — CK: Total CK: 130 U/L (ref 29–143)

## 2018-11-16 LAB — C-REACTIVE PROTEIN: CRP: 2.5 mg/L (ref <8.0)

## 2018-11-17 DIAGNOSIS — J449 Chronic obstructive pulmonary disease, unspecified: Secondary | ICD-10-CM | POA: Diagnosis not present

## 2018-11-17 DIAGNOSIS — G4733 Obstructive sleep apnea (adult) (pediatric): Secondary | ICD-10-CM | POA: Diagnosis not present

## 2018-11-28 ENCOUNTER — Ambulatory Visit
Admission: RE | Admit: 2018-11-28 | Discharge: 2018-11-28 | Disposition: A | Discharge status: Home / Self Care | Payer: Medicare HMO | Source: Ambulatory Visit | Attending: Family Medicine | Admitting: Family Medicine

## 2018-11-28 ENCOUNTER — Ambulatory Visit (INDEPENDENT_AMBULATORY_CARE_PROVIDER_SITE_OTHER): Payer: Medicare HMO | Admitting: Family Medicine

## 2018-11-28 ENCOUNTER — Other Ambulatory Visit: Payer: Self-pay

## 2018-11-28 ENCOUNTER — Encounter: Payer: Self-pay | Admitting: Family Medicine

## 2018-11-28 ENCOUNTER — Telehealth: Payer: Self-pay | Admitting: Family Medicine

## 2018-11-28 ENCOUNTER — Ambulatory Visit: Payer: Self-pay | Admitting: *Deleted

## 2018-11-28 VITALS — BP 126/82 | HR 88 | Temp 97.6°F | Resp 16 | Ht 66.0 in | Wt 228.1 lb

## 2018-11-28 DIAGNOSIS — R4020 Unspecified coma: Secondary | ICD-10-CM

## 2018-11-28 DIAGNOSIS — IMO0002 Reserved for concepts with insufficient information to code with codable children: Secondary | ICD-10-CM

## 2018-11-28 DIAGNOSIS — E1165 Type 2 diabetes mellitus with hyperglycemia: Secondary | ICD-10-CM

## 2018-11-28 DIAGNOSIS — R1013 Epigastric pain: Secondary | ICD-10-CM | POA: Diagnosis not present

## 2018-11-28 DIAGNOSIS — E1129 Type 2 diabetes mellitus with other diabetic kidney complication: Secondary | ICD-10-CM

## 2018-11-28 DIAGNOSIS — I10 Essential (primary) hypertension: Secondary | ICD-10-CM

## 2018-11-28 DIAGNOSIS — I69359 Hemiplegia and hemiparesis following cerebral infarction affecting unspecified side: Secondary | ICD-10-CM | POA: Diagnosis not present

## 2018-11-28 DIAGNOSIS — G40909 Epilepsy, unspecified, not intractable, without status epilepticus: Secondary | ICD-10-CM | POA: Diagnosis not present

## 2018-11-28 DIAGNOSIS — Z748 Other problems related to care provider dependency: Secondary | ICD-10-CM

## 2018-11-28 DIAGNOSIS — R42 Dizziness and giddiness: Secondary | ICD-10-CM | POA: Diagnosis not present

## 2018-11-28 DIAGNOSIS — M2550 Pain in unspecified joint: Secondary | ICD-10-CM | POA: Diagnosis not present

## 2018-11-28 DIAGNOSIS — R55 Syncope and collapse: Secondary | ICD-10-CM | POA: Diagnosis not present

## 2018-11-28 DIAGNOSIS — K219 Gastro-esophageal reflux disease without esophagitis: Secondary | ICD-10-CM

## 2018-11-28 DIAGNOSIS — M791 Myalgia, unspecified site: Secondary | ICD-10-CM | POA: Diagnosis not present

## 2018-11-28 DIAGNOSIS — I77810 Thoracic aortic ectasia: Secondary | ICD-10-CM

## 2018-11-28 DIAGNOSIS — R519 Headache, unspecified: Secondary | ICD-10-CM | POA: Diagnosis not present

## 2018-11-28 NOTE — Telephone Encounter (Signed)
Patient was seen for appointment today

## 2018-11-28 NOTE — Progress Notes (Addendum)
Name: [name]   MRN: [id number]    DOB: 1966/01/13   Date:11/28/2018       Progress Note  Subjective  Chief Complaint  Chief Complaint  Patient presents with   Follow-up    2 week follow up    HPI  Pt presented for 2 week follow up for Epigastric and abdominal pain and myalgias, however she mentions multiple episodes of LOC and this shifted the focus of the visit to address this more pressing matter in more detail.  History CVA/History Seizures/Loss of Consciousness: Taking plavix and statin.  She has had some ongoing weakness to the LEFT upper and lower extremities since her prior stroke, and this has not worsened with her current myalgias.  She does note that she has had 3 episodes of loss of consciousness since our last visit 2 weeks prior; notes no one has witness her initial fall, but her grandsons did find her on the floor (without injury) all three times and have not reported any seizure activity to her. She notes similar episodes previously when she "was having TIA's".  She is unsure how long she lost consciousness for during any of these episodes; most recent was 2 days ago.  She denies confusion upon regaining consciousness, denies lethargy or combativeness; does have mild combined lightheadedness and dizziness prior to the loss of consciousness episodes.  She is taking Gabapenin 421m TID, and is compliant with Keppra; Is followed by Dr. PMelrose Nakayamawith neurology.    Aortic Valve Insufficiency: Seeing Dr. CClayborn Bignessfor this - last visit 08/24/2018, was advised to maintain plavix and simvastatin along with repeat echo in 12-24 months.    HTN: She has been checking BP at home, has noticed some fluctuations, but has been below 140/90 the large majority of the time.  Today she is at goal.  She does have ongoing epigastric pain as below, but no chest pain.  She has shortness of breath that is chronic without exacerbation recently and is relieved with her nebulizer treatments.  Denies BLE edema.    - DM: Seeing endocrinology; her BG's have been fluctuating, some are in normal range, some have been above 150 with highest BG around 200, this morning she notes fasting BG was 172.  She has not checked her BG's immediately after loss of consciousness to know if hypoglycemia is occurring.  Denies polyuria, polydipsia, or polyphagia, no diaphoresis or shakiness leading up to LOC.  - Myalgias and Arthralgias: She is still suffering from ongoing pain.  Pain is worst in her right upper arm and bilateral hands.  She does also endorse pain in BLE.  She notes decreased AROM of bilateral hands since September 2020 when symptoms started.  Of note we have performed repeat CK, ANA & RF & Rheum panel, CRP, Sed rate all of which were WNL on 11/14/2018. She states her pain is still continuing to slowly worsen.  She is on 4091mGabapentin TID, percocet BID, cymbalta 2055mnce daily.  Hass appointment next week with Dr. LatHolley Raringain management to establish care.  She !has been off of her statin for 2 weeks, and this has not improved her myalgias.  - Interim History for Myalgias from 11/14/2018: Started around 10/07/2018.  Bilateral low back pain; accompanied by myalgias in legs, arms, chest; feels the pain has gotten progressively worse.  As above, EKG and Troponins were negative and reassuring in the ER.  Was seen for myalgias by LeiFlorina Ou our clinic 10/12/2018 had elevated CK  and sed rate; her repeat CK in the ER was normal.  After the 10/12/2018 labs, the statin dose was lowered and Zetia was added to see if this improved things.  She notes her neurologist did increase her gabapentin and this has not improved her pain; was also given percocet from the ER which is not helping her.  She does have upcoming pain management appointment (11/29/2018 with Dr. Holley Raring).  She is taking Cymbalta, Baclofen, gabapentin all without relief of her pain.  Labs reviewed - no anemia; CMP mostly unremarkable with no  actionable derangements.  - Epigastric/Abominal Pain: Troponins were negative in ER on 11/07/2018 when pain initially started; EKG was reassuring for no ACS at that time; was treated with Carafate and referred to GI (still also taking tagamet); has appt with Dr. Allen Norris 12/20/2018.  She states heartburn and epigastric discomfort has improved, but only very slightly.  She is still having nausea and occasional regurgitation - taking Zofran PRN. CT abdomen pelvis reassuring from GI standpoint on 11/07/2018. Denies diarrhea, fevers/chills.  Patient Active Problem List   Diagnosis Date Noted   Vitamin D deficiency 01/24/2018   Vulvovaginal candidiasis 01/24/2018   Hypokalemia 06/08/2017   Overactive bladder 05/18/2017   Asthma 06/22/2016   Dilated aortic root (HCC)    Sacroiliac dysfunction 10/09/2014   Chronic low back pain 09/03/2014   CVA, old, hemiparesis ([place]) 09/03/2014   OSA (obstructive sleep apnea) 05/15/2014   Restrictive airway disease 05/15/2014   Colitis 02/22/2014   Type 2 diabetes, uncontrolled, with renal manifestation ([place]) 01/21/2014   Chronic pain disorder 01/21/2014   Headache disorder 09/07/2013   Diabetic neuropathy ([place]) 05/23/2013   Mixed urge and stress incontinence 08/25/2011   Ureteral stricture, right 08/25/2011   Pyelonephritis 08/22/2011   Nephrolithiasis 08/22/2011   Incisional hernia 05/10/2011   Morbid obesity ([place]) 05/10/2011   Tachycardia 05/08/2011   BLINDNESS, LEGAL, [place] DEFINITION 09/14/2006   Hyperlipemia 09/13/2006   Essential hypertension 09/13/2006   MYOCARDIAL INFARCTION, HX OF 09/13/2006   GERD 09/13/2006   Seizure disorder ([place]) 09/13/2006    Social History   Tobacco Use   Smoking status: Former Smoker    Packs/day: 1.00    Years: 15.00    Pack years: 15.00    Types: Cigarettes    Quit date: 01/20/1987    Years since quitting: 31.8   Smokeless tobacco: Never Used   Tobacco comment: quit more than 20years   Substance Use Topics   Alcohol use: Yes    Alcohol/week: 0.0 standard drinks    Comment: 1 glass weekly     Current Outpatient Medications:    albuterol (PROAIR HFA) 108 (90 Base) MCG/ACT inhaler, INHALE 2 PUFFS BY MOUTH EVERY 6 HOURS AS NEEDED FOR WHEEZING OR SHORTNESS OF BREATH, Disp: 3 Inhaler, Rfl: 3   AMBULATORY NON FORMULARY MEDICATION, Medication Name: Muscle and Joint Vanishing Scent Gel- Apply as needed to skin, Disp: , Rfl:    amLODipine (NORVASC) 5 MG tablet, Take 1 tablet (5 mg total) by mouth daily., Disp: 90 tablet, Rfl: 3   baclofen (LIORESAL) 10 MG tablet, Take 1 tablet (10 mg total) by mouth 3 (three) times daily., Disp: 90 tablet, Rfl: 6   Blood Glucose Monitoring Suppl (FIFTY50 GLUCOSE METER 2.0) w/Device KIT, Accuchek, Disp: , Rfl:    butalbital-acetaminophen-caffeine (FIORICET, ESGIC) 50-325-40 MG tablet, Take 1-2 tablets by mouth every 6 (six) hours as needed., Disp: 20 tablet, Rfl: 0   Calcium Carbonate-Vitamin D (CALCIUM-VITAMIN D3 PO), D3 500- calcium 630  mg Take 1 capsule by mouth once daily, Disp: , Rfl:    cephALEXin (KEFLEX) 500 MG capsule, Take 1 capsule (500 mg total) by mouth 3 (three) times daily., Disp: 30 capsule, Rfl: 0   cimetidine (TAGAMET) 300 MG tablet, Take 1 tablet (300 mg total) by mouth 2 (two) times daily., Disp: 180 tablet, Rfl: 1   clopidogrel (PLAVIX) 75 MG tablet, Take 1 tablet (75 mg total) by mouth daily., Disp: 90 tablet, Rfl: 0   cycloSPORINE (RESTASIS) 0.05 % ophthalmic emulsion, Place 2 drops into both eyes 2 (two) times daily., Disp: , Rfl:    Dulaglutide 1.5 MG/0.5ML SOPN, Inject 1.5 mg into the skin once a week., Disp: 12 pen, Rfl: 3   DULoxetine (CYMBALTA) 20 MG capsule, Take 1 capsule (20 mg total) by mouth daily., Disp: 90 capsule, Rfl: 1   ezetimibe (ZETIA) 10 MG tablet, Take 1 tablet (10 mg total) by mouth daily., Disp: 90 tablet, Rfl: 0   fluorometholone (FML) 0.1 % ophthalmic suspension, Place 2 drops into both  eyes 2 (two) times daily., Disp: , Rfl:    fluticasone (FLONASE) 50 MCG/ACT nasal spray, Place 2 sprays into both nostrils daily., Disp: 16 g, Rfl: 3   Fluticasone-Salmeterol (ADVAIR DISKUS) 250-50 MCG/DOSE AEPB, Inhale 1 puff into the lungs 2 (two) times daily., Disp: 1 each, Rfl: 3   gabapentin (NEURONTIN) 400 MG capsule, Take 400 mg by mouth 3 (three) times daily., Disp: , Rfl:    glucose blood (PRECISION QID TEST) test strip, Accuchek brand, Disp: , Rfl:    insulin aspart (NOVOLOG FLEXPEN) 100 UNIT/ML FlexPen, Inject into the skin., Disp: , Rfl:    Insulin Glargine, 2 Unit Dial, 300 UNIT/ML SOPN, Inject 120 Units into the skin daily., Disp: 15 mL, Rfl: 1   Insulin Pen Needle (EASY TOUCH PEN NEEDLES) 31G X 8 MM MISC, Use as directed in giving insulin. Dx: E11.29, Disp: 400 each, Rfl: 3   levETIRAcetam (KEPPRA) 500 MG tablet, Take 500 mg by mouth 2 (two) times daily., Disp: , Rfl:    losartan (COZAAR) 100 MG tablet, Take 1 tablet (100 mg total) by mouth daily., Disp: 90 tablet, Rfl: 3   mirabegron ER (MYRBETRIQ) 50 MG TB24 tablet, Take 1 tablet (50 mg total) by mouth daily., Disp: 90 tablet, Rfl: 1   ondansetron (ZOFRAN ODT) 4 MG disintegrating tablet, Take 1 tablet (4 mg total) by mouth every 8 (eight) hours as needed., Disp: 20 tablet, Rfl: 0   ONETOUCH DELICA LANCETS 00L MISC, Use to check sugar three times daily as directed.Dx: E11.29; E11.40, Disp: 300 each, Rfl: 3   oxyCODONE-acetaminophen (PERCOCET) 5-325 MG tablet, Take 1 tablet by mouth every 4 (four) hours as needed for moderate pain or severe pain., Disp: 6 tablet, Rfl: 0   pantoprazole (PROTONIX) 40 MG tablet, Take 1 tablet (40 mg total) by mouth daily., Disp: 90 tablet, Rfl: 0   polyethylene glycol (MIRALAX / GLYCOLAX) packet, Take 17 g by mouth daily as needed for mild constipation., Disp: 100 each, Rfl: 3   potassium chloride SA (KLOR-CON M20) 20 MEQ tablet, Take 2 tablets (40 mEq total) by mouth daily., Disp: 60  tablet, Rfl: 0   prochlorperazine (COMPAZINE) 10 MG tablet, Take 1 tablet (10 mg total) by mouth every 6 (six) hours as needed for nausea or vomiting., Disp: 30 tablet, Rfl: 0   simvastatin (ZOCOR) 20 MG tablet, Take 1 tablet (20 mg total) by mouth at bedtime., Disp: 90 tablet, Rfl: 0  terconazole (TERAZOL 7) 0.4 % vaginal cream, Place 1 applicator vaginally at bedtime., Disp: 45 g, Rfl: 0   sucralfate (CARAFATE) 1 g tablet, Take 1 tablet (1 g total) by mouth 4 (four) times daily for 10 days., Disp: 40 tablet, Rfl: 0  Allergies  Allergen Reactions   Codeine Swelling and Other (See Comments)    Swelling and burning of mouth (inside)   Lactose Intolerance (Gi) Nausea And Vomiting    I personally reviewed active problem list, medication list, allergies, health maintenance, notes from last encounter, lab results with the patient/caregiver today.  ROS  Ten systems reviewed and is negative except as mentioned in HPI  Objective  Vitals:   11/28/18 0826  BP: 126/82  Pulse: 88  Resp: 16  Temp: 97.6 F (36.4 C)  TempSrc: Temporal  SpO2: 99%  Weight: 228 lb 1.6 oz (103.5 kg)  Height: _0  (1.676 m)   Body mass index is 36.82 kg/m.  Nursing Note and Vital Signs reviewed.  Physical Exam  Constitutional: Patient appears well-developed and well-nourished. No distress.  HENT: Head: Normocephalic and atraumatic.  Eyes: Conjunctivae and EOM are normal. No scleral icterus. PERRLA.   Neck: Normal range of motion. Neck supple. No JVD present. No thyromegaly present.  Cardiovascular: Normal rate, regular rhythm and normal heart sounds.  No murmur heard. No BLE edema. Pulmonary/Chest: Effort normal and breath sounds normal. No respiratory distress. Abdominal: Soft. Bowel sounds are normal, no distension. There is mild epigastric tenderness without guarding. No masses. Musculoskeletal: Normal range of motion, no joint effusions. No gross deformities Neurological: Pt is alert and  oriented to person, place, and time. Face is symmetric, normal speech, uvula is midline, coordination, balance, strength, speech and gait are baseline for patient - ambulates with a can and favors the LEFT side from prior stroke.  There is weakness in bilateral hand grips which she reports as ongoing since September 2020, there is some mild weakness in the LUE and LLE which she reports as ongoing since prior CVA. Skin: Skin is warm and dry. No rash noted. No erythema.  Psychiatric: Patient has a normal mood and affect. behavior is normal. Judgment and thought content normal.  No results found for this or any previous visit (from the past 72 hour(s)).  Assessment & Plan  1. Loss of consciousness ([place]) - Urgent referral to Neurology and STAT MRI is placed; labs as below.  If another LOC episode, she will go to ER for evaluation.  - Her neurologic examination is mostly at baseline aside from bilateral hand grip weakness which has been present and, per her report, slowly worsening, since September.  We discussed ER visit today, but she declines and prefers STAT MRI and Neuro referral to see her usual neurologist. - Ambulatory referral to Neurology - Dr. Melrose Nakayama 9:45am tomorrow 11/29/2018. - CBC with Differential/Platelet - COMPLETE METABOLIC PANEL WITH GFR - MR Brain Wo Contrast; Future - TSH  2. CVA, old, hemiparesis ([place]) - Compliant with her medications including plavix.  We will restart statin therapy if neurology sees fit after her appointment with them tomorrow morning as stopping the medication does not seem to have helped her myalgias. - Reviewed signs and symptoms of stroke, including any changes in her current symptoms of weakness/worsening of her hemiplegia, confusion, slurred speech, facial droop, severe headache, or loss of consciousness, she does not want to go to ER today as above - Ambulatory referral to Neurology - CBC with Differential/Platelet - COMPLETE METABOLIC PANEL WITH  GFR - MR Brain Wo Contrast; Future  3. Seizure disorder [facility]) - She is not driving and is aware that she should not attempt to drive - referral to CCM is placed to assist with transportation for MRI and referral appt. - If seizure of LOC occur, she does agree to go to ER. - Ambulatory referral to Neurology - CBC with Differential/Platelet - COMPLETE METABOLIC PANEL WITH GFR - MR Brain Wo Contrast; Future  4. Type 2 diabetes, uncontrolled, with renal manifestation (HCC) - COMPLETE METABOLIC PANEL WITH GFR - Continue to monitor BG's routinely, continue current medication regimen.  5. Myalgia - Seeing Dr. Holley Raring next week to discuss further.  Labs have been reassuring against rheumatologic disorder   6. Arthralgia, unspecified joint - Seeing Dr. Holley Raring next week to discuss further.  Labs have been reassuring against rheumatologic disorder.  7. Essential hypertension - Continue medications as prescribed; she is at goal today. - TSH  8. Gastroesophageal reflux disease without esophagitis - Continue current regimen, avoid triggers.  Follow up with GI as planned.  9. Epigastric pain - Continue current regimen, avoid triggers, has follow up coming with Dr. Allen Norris.  10. Dilated aortic root (HCC) - Taking medications as prescribed. Follow up with Cardiology as planned.  If chest pain occurs and/or she is cleared from a neurologic standpoint, we will refer to cardiology for evaluation of LOC episodes.    -Red flags and when to present for emergency care or RTC including fever >101.36F, chest pain, shortness of breath, new/worsening/un-resolving symptoms, stroke symtpoms of loss of consciousness reviewed with patient at time of visit. If another LOC episode, and/or stroke symptoms she agrees to go to ER immediately for further care.

## 2018-11-28 NOTE — Telephone Encounter (Signed)
-----   Message from Hubbard Hartshorn, [place] sent at 11/14/2018  9:37 AM EDT ----- Call patient to check in on her pain - have things improved since stopping the simvastatin completely?

## 2018-11-28 NOTE — Addendum Note (Signed)
Addended by: Hubbard Hartshorn on: 11/28/2018 09:39 AM   Modules accepted: Orders

## 2018-11-28 NOTE — Chronic Care Management (AMB) (Signed)
Chronic Care Management   Social Work Note  11/28/2018 Name: [name] MRN: [id number] DOB: 11-Jan-1966  [name] is a 53 y.o. year old female who sees Karen Hartshorn, [name] for primary care. The CCM team was consulted for assistance with Community Resources to meet her transportation needs.   CCM referral reviewed. Patient referred to the community care consortium team to provide patient with resources for transportation.  Outpatient Encounter Medications as of 11/28/2018  Medication Sig Note  . albuterol (PROAIR HFA) 108 (90 Base) MCG/ACT inhaler INHALE 2 PUFFS BY MOUTH EVERY 6 HOURS AS NEEDED FOR WHEEZING OR SHORTNESS OF BREATH 02/10/2018: Uses once a day  . AMBULATORY NON FORMULARY MEDICATION Medication Name: Muscle and Joint Vanishing Scent Gel- Apply as needed to skin   . amLODipine (NORVASC) 5 MG tablet Take 1 tablet (5 mg total) by mouth daily.   . baclofen (LIORESAL) 10 MG tablet Take 1 tablet (10 mg total) by mouth 3 (three) times daily.   . Blood Glucose Monitoring Suppl (FIFTY50 GLUCOSE METER 2.0) w/Device KIT Accuchek   . butalbital-acetaminophen-caffeine (FIORICET, ESGIC) 50-325-40 MG tablet Take 1-2 tablets by mouth every 6 (six) hours as needed. 02/10/2018: Has not picked up from pharmacy  . Calcium Carbonate-Vitamin D (CALCIUM-VITAMIN D3 PO) D3 500- calcium 630 mg Take 1 capsule by mouth once daily   . cephALEXin (KEFLEX) 500 MG capsule Take 1 capsule (500 mg total) by mouth 3 (three) times daily.   . cimetidine (TAGAMET) 300 MG tablet Take 1 tablet (300 mg total) by mouth 2 (two) times daily.   . clopidogrel (PLAVIX) 75 MG tablet Take 1 tablet (75 mg total) by mouth daily.   . cycloSPORINE (RESTASIS) 0.05 % ophthalmic emulsion Place 2 drops into both eyes 2 (two) times daily.   . Dulaglutide 1.5 MG/0.5ML SOPN Inject 1.5 mg into the skin once a week.   . DULoxetine (CYMBALTA) 20 MG capsule Take 1 capsule (20 mg total) by mouth daily.   [name] ezetimibe (ZETIA)  10 MG tablet Take 1 tablet (10 mg total) by mouth daily.   . fluorometholone (FML) 0.1 % ophthalmic suspension Place 2 drops into both eyes 2 (two) times daily.   . fluticasone (FLONASE) 50 MCG/ACT nasal spray Place 2 sprays into both nostrils daily.   . Fluticasone-Salmeterol (ADVAIR DISKUS) 250-50 MCG/DOSE AEPB Inhale 1 puff into the lungs 2 (two) times daily.   [name] gabapentin (NEURONTIN) 400 MG capsule Take 400 mg by mouth 3 (three) times daily.   [name] glucose blood (PRECISION QID TEST) test strip Accuchek brand   . insulin aspart (NOVOLOG FLEXPEN) 100 UNIT/ML FlexPen Inject into the skin.   . Insulin Glargine, 2 Unit Dial, 300 UNIT/ML SOPN Inject 120 Units into the skin daily.   . Insulin Pen Needle (EASY TOUCH PEN NEEDLES) 31G X 8 MM MISC Use as directed in giving insulin. Dx: E11.29   . levETIRAcetam (KEPPRA) 500 MG tablet Take 500 mg by mouth 2 (two) times daily.   [name] losartan (COZAAR) 100 MG tablet Take 1 tablet (100 mg total) by mouth daily.   . mirabegron ER (MYRBETRIQ) 50 MG TB24 tablet Take 1 tablet (50 mg total) by mouth daily.   . ondansetron (ZOFRAN ODT) 4 MG disintegrating tablet Take 1 tablet (4 mg total) by mouth every 8 (eight) hours as needed.   Glory Rosebush DELICA LANCETS 13Y MISC Use to check sugar three times daily as directed.Dx: E11.29; E11.40   . oxyCODONE-acetaminophen (PERCOCET) 5-325 MG  tablet Take 1 tablet by mouth every 4 (four) hours as needed for moderate pain or severe pain.   . pantoprazole (PROTONIX) 40 MG tablet Take 1 tablet (40 mg total) by mouth daily.   . polyethylene glycol (MIRALAX / GLYCOLAX) packet Take 17 g by mouth daily as needed for mild constipation. 02/10/2018: daily  . potassium chloride SA (KLOR-CON M20) 20 MEQ tablet Take 2 tablets (40 mEq total) by mouth daily.   . prochlorperazine (COMPAZINE) 10 MG tablet Take 1 tablet (10 mg total) by mouth every 6 (six) hours as needed for nausea or vomiting.   . simvastatin (ZOCOR) 20 MG tablet Take 1 tablet (20 mg  total) by mouth at bedtime.   . sucralfate (CARAFATE) 1 g tablet Take 1 tablet (1 g total) by mouth 4 (four) times daily for 10 days.   [name] terconazole (TERAZOL 7) 0.4 % vaginal cream Place 1 applicator vaginally at bedtime.    No facility-administered encounter medications on file as of 11/28/2018.     Goals Addressed   None     Follow Up Plan: Patient referred to the community care consortium team to provide patient with resources for transportation.    Elliot Gurney, [place] [facility], [facility] Management [phone]

## 2018-11-29 ENCOUNTER — Emergency Department
Admission: EM | Admit: 2018-11-29 | Discharge: 2018-11-29 | Disposition: A | Discharge status: Home / Self Care | Payer: Medicare HMO | Attending: Emergency Medicine | Admitting: Emergency Medicine

## 2018-11-29 ENCOUNTER — Other Ambulatory Visit: Payer: Self-pay

## 2018-11-29 ENCOUNTER — Ambulatory Visit: Payer: Medicare HMO | Admitting: Student in an Organized Health Care Education/Training Program

## 2018-11-29 ENCOUNTER — Telehealth: Payer: Self-pay

## 2018-11-29 ENCOUNTER — Encounter: Payer: Self-pay | Admitting: Emergency Medicine

## 2018-11-29 DIAGNOSIS — E119 Type 2 diabetes mellitus without complications: Secondary | ICD-10-CM | POA: Diagnosis not present

## 2018-11-29 DIAGNOSIS — I252 Old myocardial infarction: Secondary | ICD-10-CM | POA: Diagnosis not present

## 2018-11-29 DIAGNOSIS — J449 Chronic obstructive pulmonary disease, unspecified: Secondary | ICD-10-CM | POA: Insufficient documentation

## 2018-11-29 DIAGNOSIS — I10 Essential (primary) hypertension: Secondary | ICD-10-CM | POA: Insufficient documentation

## 2018-11-29 DIAGNOSIS — Z87891 Personal history of nicotine dependence: Secondary | ICD-10-CM | POA: Insufficient documentation

## 2018-11-29 DIAGNOSIS — Z7902 Long term (current) use of antithrombotics/antiplatelets: Secondary | ICD-10-CM | POA: Insufficient documentation

## 2018-11-29 DIAGNOSIS — Z8673 Personal history of transient ischemic attack (TIA), and cerebral infarction without residual deficits: Secondary | ICD-10-CM | POA: Diagnosis not present

## 2018-11-29 DIAGNOSIS — Z79899 Other long term (current) drug therapy: Secondary | ICD-10-CM | POA: Insufficient documentation

## 2018-11-29 DIAGNOSIS — H9202 Otalgia, left ear: Secondary | ICD-10-CM | POA: Diagnosis present

## 2018-11-29 DIAGNOSIS — H7092 Unspecified mastoiditis, left ear: Secondary | ICD-10-CM | POA: Diagnosis not present

## 2018-11-29 LAB — COMPREHENSIVE METABOLIC PANEL
ALT: 15 U/L (ref 0–44)
AST: 12 U/L — ABNORMAL LOW (ref 15–41)
Albumin: 4.1 g/dL (ref 3.5–5.0)
Alkaline Phosphatase: 82 U/L (ref 38–126)
Anion gap: 10 (ref 5–15)
BUN: 20 mg/dL (ref 6–20)
CO2: 27 mmol/L (ref 22–32)
Calcium: 9.3 mg/dL (ref 8.9–10.3)
Chloride: 104 mmol/L (ref 98–111)
Creatinine, Ser: 0.82 mg/dL (ref 0.44–1.00)
GFR calc Af Amer: >60 mL/min (ref >60)
GFR calc non Af Amer: >60 mL/min (ref >60)
Glucose, Bld: 184 mg/dL — ABNORMAL HIGH (ref 70–99)
Potassium: 3.5 mmol/L (ref 3.5–5.1)
Sodium: 141 mmol/L (ref 135–145)
Total Bilirubin: 0.5 mg/dL (ref 0.3–1.2)
Total Protein: 8.3 g/dL — ABNORMAL HIGH (ref 6.5–8.1)

## 2018-11-29 LAB — COMPLETE METABOLIC PANEL WITH GFR
AG Ratio: 1.3 (calc) (ref 1.0–2.5)
ALT: 10 U/L (ref 6–29)
AST: 9 U/L — ABNORMAL LOW (ref 10–35)
Albumin: 3.9 g/dL (ref 3.6–5.1)
Alkaline phosphatase (APISO): 76 U/L (ref 37–153)
BUN: 18 mg/dL (ref 7–25)
CO2: 29 mmol/L (ref 20–32)
Calcium: 9.2 mg/dL (ref 8.6–10.4)
Chloride: 106 mmol/L (ref 98–110)
Creat: 0.82 mg/dL (ref 0.50–1.05)
GFR, Est African American: 94 mL/min/{1.73_m2} (ref >=60)
GFR, Est Non African American: 81 mL/min/{1.73_m2} (ref >=60)
Globulin: 2.9 g/dL (calc) (ref 1.9–3.7)
Glucose, Bld: 126 mg/dL — ABNORMAL HIGH (ref 65–99)
Potassium: 4.2 mmol/L (ref 3.5–5.3)
Sodium: 141 mmol/L (ref 135–146)
Total Bilirubin: 0.3 mg/dL (ref 0.2–1.2)
Total Protein: 6.8 g/dL (ref 6.1–8.1)

## 2018-11-29 LAB — CBC
HCT: 40.3 % (ref 36.0–46.0)
Hemoglobin: 12.6 g/dL (ref 12.0–15.0)
MCH: 28.8 pg (ref 26.0–34.0)
MCHC: 31.3 g/dL (ref 30.0–36.0)
MCV: 92 fL (ref 80.0–100.0)
Platelets: 240 10*3/uL (ref 150–400)
RBC: 4.38 MIL/uL (ref 3.87–5.11)
RDW: 13 % (ref 11.5–15.5)
WBC: 6.5 10*3/uL (ref 4.0–10.5)
nRBC: 0 % (ref 0.0–0.2)

## 2018-11-29 LAB — CBC WITH DIFFERENTIAL/PLATELET
Absolute Monocytes: 488 cells/uL (ref 200–950)
Basophils Absolute: 18 cells/uL (ref 0–200)
Basophils Relative: 0.3 %
Eosinophils Absolute: 18 cells/uL (ref 15–500)
Eosinophils Relative: 0.3 %
HCT: 36.4 % (ref 35.0–45.0)
Hemoglobin: 11.8 g/dL (ref 11.7–15.5)
Lymphs Abs: 1732 cells/uL (ref 850–3900)
MCH: 29.1 pg (ref 27.0–33.0)
MCHC: 32.4 g/dL (ref 32.0–36.0)
MCV: 89.7 fL (ref 80.0–100.0)
MPV: 11.9 fL (ref 7.5–12.5)
Monocytes Relative: 8 %
Neutro Abs: 3843 cells/uL (ref 1500–7800)
Neutrophils Relative %: 63 %
Platelets: 232 10*3/uL (ref 140–400)
RBC: 4.06 10*6/uL (ref 3.80–5.10)
RDW: 12.9 % (ref 11.0–15.0)
Total Lymphocyte: 28.4 %
WBC: 6.1 10*3/uL (ref 3.8–10.8)

## 2018-11-29 LAB — TSH: TSH: 0.62 mIU/L

## 2018-11-29 MED ORDER — SULFAMETHOXAZOLE-TRIMETHOPRIM 800-160 MG PO TABS
1.0000 | ORAL_TABLET | Freq: Once | ORAL | Status: AC
  Administered 2018-11-29: 1 via ORAL
  Filled 2018-11-29: qty 1

## 2018-11-29 MED ORDER — CIPROFLOXACIN HCL 500 MG PO TABS: 500.0000 mg | ORAL_TABLET | Freq: Two times a day (BID) | ORAL | 0 refills | Status: DC

## 2018-11-29 MED ORDER — SULFAMETHOXAZOLE-TRIMETHOPRIM 800-160 MG PO TABS: 1.0000 | ORAL_TABLET | Freq: Two times a day (BID) | ORAL | 0 refills | Status: AC

## 2018-11-29 MED ORDER — PREDNISONE 10 MG PO TABS: ORAL_TABLET | ORAL | 0 refills | Status: AC

## 2018-11-29 MED ORDER — PREDNISONE 20 MG PO TABS
50.0000 mg | ORAL_TABLET | Freq: Once | ORAL | Status: AC
  Administered 2018-11-29: 50 mg via ORAL
  Filled 2018-11-29: qty 3

## 2018-11-29 MED ORDER — CIPROFLOXACIN HCL 500 MG PO TABS
500.0000 mg | ORAL_TABLET | Freq: Once | ORAL | Status: AC
  Administered 2018-11-29: 500 mg via ORAL
  Filled 2018-11-29: qty 1

## 2018-11-29 NOTE — Discharge Instructions (Signed)
Please take your antibiotics as prescribed as well as your steroids.  Dr. Tami Ribas would like to see you in 1 week

## 2018-11-29 NOTE — ED Triage Notes (Signed)
Pt reports had a MRI done yesterday and was called by her MD and told to come to the ED for admission for an infection to a bone behind her ear.

## 2018-11-29 NOTE — ED Notes (Signed)
Patient wheeled back to room by this RN. Patient reports being called today and told to come back to ED due to abnormal MRI result. Patient reports being told she has a infection behind her ear. Even and non labored respirations noted.

## 2018-11-29 NOTE — ED Provider Notes (Signed)
[facility] Emergency Department Provider Note   ____________________________________________    I have reviewed the triage vital signs and the nursing notes.   HISTORY  Chief Complaint Abnormal MRI    HPI [name] is a 53 y.o. female who presents for evaluation after having an abnormal MRI.  Patient with extensive past medical history detailed below including diabetes who reports that she has had left-sided posterior ear pain for over a month.  She is also had some intermittent dizziness recently that has led to her having several falls over the last 2 months.  She denies injury from those falls.  No focal weakness or numbness.  No nausea or vomiting.  No change in vision.  Has not taken anything for ear pain.  Saw her PCP yesterday for regular visit, MRI ordered and obtained last night, recommended she come to the ED today for abnormal MRI  Past Medical History:  Diagnosis Date  . Anxiety and depression 09/13/2006   Qualifier: Diagnosis of  By: Hassell Done [name], Tori Milks    . Arthritis    joint pain   . Asthma   . COPD (chronic obstructive pulmonary disease) (HCC)    chronic bronchitis   . Depression   . Diabetes mellitus    since age 28; type 2 IDDM  . Diabetic neuropathy, painful ([place])    FEET AND HANDS  . Diabetic retinopathy ([place])   . Diastolic dysfunction    a.  Echo 11/17: EF 60-65%, mild LVH, no RWMA, Gr1DD, mild AI, dilated aortic root measuring 38 mm, mildly dilated ascending aorta  . Dilated aortic root ([place])    49m by echo 11/2015  . Dizziness    secondary to diabetes and hypertension   . Epilepsy idiopathic petit mal ([place]    last seizure 2012;controlled w/ topomax  . GERD (gastroesophageal reflux disease)   . Glaucoma    NOT ON ANY EYE DROPS   . Headache(784.0)    migraines   . Heart murmur    born with   . History of stress test    a. 11/17: Normal perfusion, EF 53%, normal study  . Hx of blood clots    hematomas  removed from left side of brain from [id number]   . Hyperlipidemia   . Hypertension   . Legally blind    left eye   . MIGRAINE HEADACHE 09/14/2006   Qualifier: Diagnosis of  By: MarHassell DoneP, NykTori Milks . Myocardial infarction ([place]  a.  Patient reported history of without objective documentation  . Nephrolithiasis    frequent urination , urination at nite  PT SEEN IN ER 09/22/11 FOR BACK AND RT SIDED PAIN--HAS KNOWN STONE RT URETER AND UA IN ER SHOWED UTI  . Pain 09/23/11   LOWER BACK AND RIGHT SIDE--PT HAS RIGHT URETERAL STONE  . Pneumonia    hx of 2009  . Pyelonephritis   . Seizures ([facility] . Sleep apnea    [facility] ; mild  . Stress incontinence   . [facility]  [facility]  RESIDUAL LEFT LEG WEAKNESS--NO OTHER RESIDUAL PROBLEMS    Patient Active Problem List   Diagnosis Date Noted  . Vitamin D deficiency 01/24/2018  . Vulvovaginal candidiasis 01/24/2018  . Hypokalemia 06/08/2017  . Overactive bladder 05/18/2017  . Asthma 06/22/2016  . Dilated aortic root ([place] . Sacroiliac dysfunction 10/09/2014  .  Chronic low back pain 09/03/2014  . CVA, old, hemiparesis ([place]) 09/03/2014  . OSA (obstructive sleep apnea) 05/15/2014  . Restrictive airway disease 05/15/2014  . Colitis 02/22/2014  . Type 2 diabetes, uncontrolled, with renal manifestation ([place]) 01/21/2014  . Chronic pain disorder 01/21/2014  . Headache disorder 09/07/2013  . Diabetic neuropathy ([place]) 05/23/2013  . Mixed urge and stress incontinence 08/25/2011  . Ureteral stricture, right 08/25/2011  . Pyelonephritis 08/22/2011  . Nephrolithiasis 08/22/2011  . Incisional hernia 05/10/2011  . Morbid obesity ([place]) 05/10/2011  . Tachycardia 05/08/2011  . BLINDNESS, [place] DEFINITION 09/14/2006  . Hyperlipemia 09/13/2006  . Essential hypertension 09/13/2006  . MYOCARDIAL INFARCTION, HX OF 09/13/2006  . GERD 09/13/2006  . Seizure disorder ([place]) 09/13/2006    Past  Surgical History:  Procedure Laterality Date  . BRAIN HEMATOMA EVACUATION     five procedures total, first procedure when 27 months old, last at 53 years of age.  . CYSTOSCOPY W/ RETROGRADES  09/24/2011   Procedure: CYSTOSCOPY WITH RETROGRADE PYELOGRAM;  Surgeon: Malka So, MD;  Location: [facility];  Service: Urology;  Laterality: Right;  . CYSTOSCOPY WITH URETEROSCOPY  09/24/2011   Procedure: CYSTOSCOPY WITH URETEROSCOPY;  Surgeon: Malka So, MD;  Location: [facility];  Service: Urology;  Laterality: Right;  Balloon dilation right ureter   . CYSTOSCOPY/RETROGRADE/URETEROSCOPY  08/24/2011   Procedure: CYSTOSCOPY/RETROGRADE/URETEROSCOPY;  Surgeon: Molli Hazard, MD;  Location: [facility];  Service: Urology;  Laterality: Right;  Cysto, Right retrograde Pyelogram, right stent placement.   Fatima Blank HERNIA REPAIR  05/05/2011   Procedure: LAPAROSCOPIC INCISIONAL HERNIA;  Surgeon: Rolm Bookbinder, MD;  Location: [place];  Service: General;  Laterality: N/A;  . kidney stone removal    . MASS EXCISION  05/05/2011   Procedure: EXCISION MASS;  Surgeon: Rolm Bookbinder, MD;  Location: [place];  Service: General;  Laterality: Right;  . PCNL    . RETINAL DETACHMENT SURGERY Left 1990  . SHUNT REMOVAL     shunt inserted at age 50 removed at age 40   . VAGINAL HYSTERECTOMY  1996    Prior to Admission medications   Medication Sig Start Date End Date Taking? Authorizing Provider  albuterol (PROAIR HFA) 108 (90 Base) MCG/ACT inhaler INHALE 2 PUFFS BY MOUTH EVERY 6 HOURS AS NEEDED FOR WHEEZING OR SHORTNESS OF BREATH 01/24/18   Hubbard Hartshorn, [name]  AMBULATORY NON FORMULARY MEDICATION Medication Name: Muscle and Joint Vanishing Scent Gel- Apply as needed to skin    [provider]  amLODipine (NORVASC) 5 MG tablet Take 1 tablet (5 mg total) by mouth daily. 10/12/18   Delsa Grana, [name]  baclofen (LIORESAL) 10 MG tablet Take 1 tablet (10 mg total) by mouth 3 (three) times daily. 07/21/18   Hubbard Hartshorn, [name]   Blood Glucose Monitoring Suppl (FIFTY50 GLUCOSE METER 2.0) w/Device KIT Accuchek 03/02/18 03/02/19  [provider]  butalbital-acetaminophen-caffeine (FIORICET, ESGIC) 50-325-40 MG tablet Take 1-2 tablets by mouth every 6 (six) hours as needed. 02/04/18 02/04/19  Earleen Newport, MD  Calcium Carbonate-Vitamin D (CALCIUM-VITAMIN D3 PO) D3 500- calcium 630 mg Take 1 capsule by mouth once daily    [provider]  cephALEXin (KEFLEX) 500 MG capsule Take 1 capsule (500 mg total) by mouth 3 (three) times daily. 11/10/18   Nance Pear, MD  cimetidine (TAGAMET) 300 MG tablet Take 1 tablet (300 mg total) by mouth 2 (two) times daily. 07/21/18   Hubbard Hartshorn, [name]  ciprofloxacin (CIPRO) 500  MG tablet Take 1 tablet (500 mg total) by mouth 2 (two) times daily for 10 days. 11/29/18 12/09/18  Lavonia Drafts, MD  clopidogrel (PLAVIX) 75 MG tablet Take 1 tablet (75 mg total) by mouth daily. 10/07/18   Steele Sizer, MD  cycloSPORINE (RESTASIS) 0.05 % ophthalmic emulsion Place 2 drops into both eyes 2 (two) times daily.    [provider]  Dulaglutide 1.5 MG/0.5ML SOPN Inject 1.5 mg into the skin once a week. 10/20/18   Hubbard Hartshorn, [name]  DULoxetine (CYMBALTA) 20 MG capsule Take 1 capsule (20 mg total) by mouth daily. 10/12/18   Delsa Grana, [name]  ezetimibe (ZETIA) 10 MG tablet Take 1 tablet (10 mg total) by mouth daily. 10/13/18   Delsa Grana, [name]  fluorometholone (FML) 0.1 % ophthalmic suspension Place 2 drops into both eyes 2 (two) times daily.    [provider]  fluticasone (FLONASE) 50 MCG/ACT nasal spray Place 2 sprays into both nostrils daily. 10/12/18   Delsa Grana, [name]  Fluticasone-Salmeterol (ADVAIR DISKUS) 250-50 MCG/DOSE AEPB Inhale 1 puff into the lungs 2 (two) times daily. 03/28/18   Hubbard Hartshorn, [name]  gabapentin (NEURONTIN) 400 MG capsule Take 400 mg by mouth 3 (three) times daily. 07/08/18   [provider]  glucose blood (PRECISION QID TEST)  test strip Accuchek brand 03/02/18 03/02/19  [provider]  insulin aspart (NOVOLOG FLEXPEN) 100 UNIT/ML FlexPen Inject into the skin. 06/08/18 06/08/19  [provider]  Insulin Glargine, 2 Unit Dial, 300 UNIT/ML SOPN Inject 120 Units into the skin daily. 03/30/18   Hubbard Hartshorn, [name]  Insulin Pen Needle (EASY TOUCH PEN NEEDLES) 31G X 8 MM MISC Use as directed in giving insulin. Dx: E11.29 03/25/18   Hubbard Hartshorn, [name]  levETIRAcetam (KEPPRA) 500 MG tablet Take 500 mg by mouth 2 (two) times daily. 07/08/18   [provider]  losartan (COZAAR) 100 MG tablet Take 1 tablet (100 mg total) by mouth daily. 07/21/18   Hubbard Hartshorn, [name]  mirabegron ER (MYRBETRIQ) 50 MG TB24 tablet Take 1 tablet (50 mg total) by mouth daily. 10/20/18   Hubbard Hartshorn, [name]  ondansetron (ZOFRAN ODT) 4 MG disintegrating tablet Take 1 tablet (4 mg total) by mouth every 8 (eight) hours as needed. 11/14/18   Hubbard Hartshorn, [name]  [facility] DELICA LANCETS 72Z MISC Use to check sugar three times daily as directed.Dx: E11.29; E11.40 10/21/16   Gildardo Cranker, DO  oxyCODONE-acetaminophen (PERCOCET) 5-325 MG tablet Take 1 tablet by mouth every 4 (four) hours as needed for moderate pain or severe pain. 11/06/18 11/06/19  Nena Polio, MD  pantoprazole (PROTONIX) 40 MG tablet Take 1 tablet (40 mg total) by mouth daily. 07/21/18   Hubbard Hartshorn, [name]  polyethylene glycol (MIRALAX / GLYCOLAX) packet Take 17 g by mouth daily as needed for mild constipation. 07/30/16   Lauree Chandler, NP  potassium chloride SA (KLOR-CON M20) 20 MEQ tablet Take 2 tablets (40 mEq total) by mouth daily. 10/12/18   Delsa Grana, [name]  predniSONE (DELTASONE) 10 MG tablet Take 4 tablets (40 mg total) by mouth daily for 3 days, THEN 3 tablets (30 mg total) daily for 3 days, THEN 2 tablets (20 mg total) daily for 3 days, THEN 1 tablet (10 mg total) daily for 3 days. 11/29/18 12/11/18  Lavonia Drafts, MD  prochlorperazine (COMPAZINE) 10 MG tablet  Take 1 tablet (10 mg total) by mouth every 6 (six) hours as needed for  nausea or vomiting. 11/06/18   Nena Polio, MD  simvastatin (ZOCOR) 20 MG tablet Take 1 tablet (20 mg total) by mouth at bedtime. 10/13/18   Delsa Grana, [name]  sucralfate (CARAFATE) 1 g tablet Take 1 tablet (1 g total) by mouth 4 (four) times daily for 10 days. 11/07/18 11/17/18  Lavonia Drafts, MD  sulfamethoxazole-trimethoprim (BACTRIM DS) 800-160 MG tablet Take 1 tablet by mouth 2 (two) times daily for 10 days. 11/29/18 12/09/18  Lavonia Drafts, MD  terconazole (TERAZOL 7) 0.4 % vaginal cream Place 1 applicator vaginally at bedtime. 07/21/18   Hubbard Hartshorn, [name]     Allergies Codeine and Lactose intolerance (gi)  Family History  Problem Relation Age of Onset  . Uterine cancer Mother   . Hypertension Mother   . Cancer Mother   . Brain cancer Maternal Grandmother   . Hypertension Maternal Grandmother   . Birth defects Daughter   . Hypertension Daughter   . Cirrhosis Maternal Grandfather   . ADD / ADHD Maternal Grandfather   . Birth defects Maternal Grandfather   . Diabetes Maternal Grandfather   . Anesthesia problems Neg Hx   . Colon cancer Neg Hx   . Esophageal cancer Neg Hx   . Pancreatic cancer Neg Hx     Social History Social History   Tobacco Use  . Smoking status: Former Smoker    Packs/day: 1.00    Years: 15.00    Pack years: 15.00    Types: Cigarettes    Quit date: 01/20/1987    Years since quitting: 31.8  . Smokeless tobacco: Never Used  . Tobacco comment: quit more than 20years  Substance Use Topics  . Alcohol use: Yes    Alcohol/week: 0.0 standard drinks    Comment: 1 glass weekly  . Drug use: No    Comment: hx of marijuana use no longer uses     Review of Systems  Constitutional: No fevers Eyes: No visual changes.  ENT: As above Cardiovascular: Denies chest pain.  No palpitations Respiratory: Denies shortness of breath. Gastrointestinal: No abdominal pain.   Genitourinary:  Negative for dysuria. Musculoskeletal: Negative for extremity injury Skin: Negative for rash. Neurological: Negative for headache   ____________________________________________   PHYSICAL EXAM:  VITAL SIGNS: ED Triage Vitals [11/29/18 1114]  Enc Vitals Group     BP (!) 167/100     Pulse Rate 97     Resp 20     Temp 99.4 F (37.4 C)     Temp Source Oral     SpO2 99 %     Weight 100.7 kg (222 lb)     Height 1.676 m (5' 6" )     Head Circumference      Peak Flow      Pain Score 9     Pain Loc      Pain Edu?      Excl. in [place]?     Constitutional: Alert and oriented.   Nose: No congestion/rhinnorhea. Mouth/Throat: Mucous membranes are moist.   Ears: Left ear, cerumen, possible mild effusion, TM intact.  Tenderness posterior to the auricle, no significant erythema Cardiovascular: Normal rate, regular rhythm.  Good peripheral circulation. Respiratory: Normal respiratory effort.  No retractions. Lungs CTAB. Gastrointestinal: Soft and nontender. No distention.    Musculoskeletal:  Warm and well perfused Neurologic:  Normal speech and language. No gross focal neurologic deficits are appreciated.  Skin:  Skin is warm, dry and intact. No rash noted. Psychiatric: Mood and affect  are normal. Speech and behavior are normal.  ____________________________________________   LABS (all labs ordered are listed, but only abnormal results are displayed)  Labs Reviewed  COMPREHENSIVE METABOLIC PANEL - Abnormal; Notable for the following components:      Result Value   Glucose, Bld 184 (*)    Total Protein 8.3 (*)    AST 12 (*)    All other components within normal limits  CBC   ____________________________________________  EKG  None ____________________________________________  RADIOLOGY  Reviewed MRI from last night ____________________________________________   PROCEDURES  Procedure(s) performed: No  Procedures   Critical Care performed: No  ____________________________________________   INITIAL IMPRESSION / ASSESSMENT AND PLAN / ED COURSE  Pertinent labs & imaging results that were available during my care of the patient were reviewed by me and considered in my medical decision making (see chart for details).  Patient's MRI demonstrates possible mastoiditis, lab work is reassuring, afebrile.  Will discuss with ENT  Discussed with Dr. Tami Ribas of ENT, he recommends Cipro Bactrim, prednisone taper and follow-up with him in 1 week given the chronic nature of this complaint    ____________________________________________   FINAL CLINICAL IMPRESSION(S) / ED DIAGNOSES  Final diagnoses:  Mastoiditis of left side        Note:  This document was prepared using Dragon voice recognition software and may include unintentional dictation errors.   Lavonia Drafts, MD 11/29/18 [id number]

## 2018-11-29 NOTE — Telephone Encounter (Signed)
Copied from [place] [phone]. Topic: Referral - Status >> Nov 29, 2018  1:07 PM Simone Curia D wrote: 12/52/4799 Left message for patient to return my call about Medicaid transportation and [place] transportation benefit. Ambrose Mantle [phone]

## 2018-12-05 ENCOUNTER — Other Ambulatory Visit: Payer: Self-pay

## 2018-12-05 ENCOUNTER — Ambulatory Visit: Payer: Medicare HMO | Admitting: Family Medicine

## 2018-12-05 ENCOUNTER — Encounter: Payer: Self-pay | Admitting: Emergency Medicine

## 2018-12-05 ENCOUNTER — Emergency Department
Admission: EM | Admit: 2018-12-05 | Discharge: 2018-12-05 | Disposition: A | Discharge status: Home / Self Care | Payer: Medicare HMO | Attending: Emergency Medicine | Admitting: Emergency Medicine

## 2018-12-05 ENCOUNTER — Emergency Department: Payer: Medicare HMO

## 2018-12-05 DIAGNOSIS — Z8673 Personal history of transient ischemic attack (TIA), and cerebral infarction without residual deficits: Secondary | ICD-10-CM | POA: Insufficient documentation

## 2018-12-05 DIAGNOSIS — R42 Dizziness and giddiness: Secondary | ICD-10-CM | POA: Diagnosis not present

## 2018-12-05 DIAGNOSIS — J449 Chronic obstructive pulmonary disease, unspecified: Secondary | ICD-10-CM | POA: Insufficient documentation

## 2018-12-05 DIAGNOSIS — Z79899 Other long term (current) drug therapy: Secondary | ICD-10-CM | POA: Insufficient documentation

## 2018-12-05 DIAGNOSIS — N200 Calculus of kidney: Secondary | ICD-10-CM | POA: Diagnosis not present

## 2018-12-05 DIAGNOSIS — E119 Type 2 diabetes mellitus without complications: Secondary | ICD-10-CM | POA: Insufficient documentation

## 2018-12-05 DIAGNOSIS — F17200 Nicotine dependence, unspecified, uncomplicated: Secondary | ICD-10-CM | POA: Diagnosis not present

## 2018-12-05 DIAGNOSIS — J45909 Unspecified asthma, uncomplicated: Secondary | ICD-10-CM | POA: Diagnosis not present

## 2018-12-05 DIAGNOSIS — Z7902 Long term (current) use of antithrombotics/antiplatelets: Secondary | ICD-10-CM | POA: Insufficient documentation

## 2018-12-05 DIAGNOSIS — R109 Unspecified abdominal pain: Secondary | ICD-10-CM | POA: Diagnosis not present

## 2018-12-05 DIAGNOSIS — D3501 Benign neoplasm of right adrenal gland: Secondary | ICD-10-CM | POA: Diagnosis not present

## 2018-12-05 DIAGNOSIS — I10 Essential (primary) hypertension: Secondary | ICD-10-CM | POA: Diagnosis not present

## 2018-12-05 DIAGNOSIS — I252 Old myocardial infarction: Secondary | ICD-10-CM | POA: Diagnosis not present

## 2018-12-05 DIAGNOSIS — R197 Diarrhea, unspecified: Secondary | ICD-10-CM | POA: Diagnosis not present

## 2018-12-05 DIAGNOSIS — Z794 Long term (current) use of insulin: Secondary | ICD-10-CM | POA: Diagnosis not present

## 2018-12-05 LAB — COMPREHENSIVE METABOLIC PANEL
ALT: 14 U/L (ref 0–44)
AST: 11 U/L — ABNORMAL LOW (ref 15–41)
Albumin: 4.1 g/dL (ref 3.5–5.0)
Alkaline Phosphatase: 81 U/L (ref 38–126)
Anion gap: 10 (ref 5–15)
BUN: 21 mg/dL — ABNORMAL HIGH (ref 6–20)
CO2: 22 mmol/L (ref 22–32)
Calcium: 9.1 mg/dL (ref 8.9–10.3)
Chloride: 104 mmol/L (ref 98–111)
Creatinine, Ser: 0.96 mg/dL (ref 0.44–1.00)
GFR calc Af Amer: >60 mL/min (ref >60)
GFR calc non Af Amer: >60 mL/min (ref >60)
Glucose, Bld: 220 mg/dL — ABNORMAL HIGH (ref 70–99)
Potassium: 4.4 mmol/L (ref 3.5–5.1)
Sodium: 136 mmol/L (ref 135–145)
Total Bilirubin: 0.7 mg/dL (ref 0.3–1.2)
Total Protein: 7.9 g/dL (ref 6.5–8.1)

## 2018-12-05 LAB — URINALYSIS, COMPLETE (UACMP) WITH MICROSCOPIC
Bacteria, UA: NONE SEEN
Bilirubin Urine: NEGATIVE
Glucose, UA: 50 mg/dL — AB
Hgb urine dipstick: NEGATIVE
Ketones, ur: NEGATIVE mg/dL
Nitrite: NEGATIVE
Protein, ur: 30 mg/dL — AB
Specific Gravity, Urine: 1.021 (ref 1.005–1.030)
pH: 5 (ref 5.0–8.0)

## 2018-12-05 LAB — CBC
HCT: 41.1 % (ref 36.0–46.0)
Hemoglobin: 13 g/dL (ref 12.0–15.0)
MCH: 29 pg (ref 26.0–34.0)
MCHC: 31.6 g/dL (ref 30.0–36.0)
MCV: 91.7 fL (ref 80.0–100.0)
Platelets: 284 10*3/uL (ref 150–400)
RBC: 4.48 MIL/uL (ref 3.87–5.11)
RDW: 13.5 % (ref 11.5–15.5)
WBC: 8.3 10*3/uL (ref 4.0–10.5)
nRBC: 0 % (ref 0.0–0.2)

## 2018-12-05 LAB — GLUCOSE, CAPILLARY: Glucose-Capillary: 139 mg/dL — ABNORMAL HIGH (ref 70–99)

## 2018-12-05 LAB — LIPASE, BLOOD: Lipase: 32 U/L (ref 11–51)

## 2018-12-05 MED ORDER — OXYCODONE-ACETAMINOPHEN 5-325 MG PO TABS: 1.0000 | ORAL_TABLET | ORAL | 0 refills | Status: DC | PRN

## 2018-12-05 MED ORDER — ONDANSETRON 4 MG PO TBDP
4.0000 mg | ORAL_TABLET | Freq: Once | ORAL | Status: AC
  Administered 2018-12-05: 4 mg via ORAL
  Filled 2018-12-05: qty 1

## 2018-12-05 MED ORDER — ONDANSETRON 4 MG PO TBDP: 4.0000 mg | ORAL_TABLET | Freq: Three times a day (TID) | ORAL | 0 refills | Status: DC | PRN

## 2018-12-05 MED ORDER — MORPHINE SULFATE (PF) 4 MG/ML IV SOLN
4.0000 mg | Freq: Once | INTRAVENOUS | Status: AC
  Administered 2018-12-05: 4 mg via INTRAMUSCULAR
  Filled 2018-12-05: qty 1

## 2018-12-05 NOTE — ED Notes (Signed)
Sent urine sample to lab

## 2018-12-05 NOTE — ED Provider Notes (Signed)
[facility] Emergency Department Provider Note  Time seen: 1:21 PM  I have reviewed the triage vital signs and the nursing notes.   HISTORY  Chief Complaint Diarrhea, Dizziness, and Weakness   HPI [name] is a 53 y.o. female with multiple medical issues most recently she was diagnosed with possible mastoiditis finished a course of antibiotics, presents to the emergency department for "not feeling well."  According to the patient she finished her course of antibiotics and has a follow-up appointment this week with ENT when she obtains an ID per patient from the [facility] this week.   Denies any fever.  States she has been experiencing intermittent diarrhea since yesterday states she had 3 episodes of diarrhea yesterday and one episode today.  Patient states intermittent nausea as well.  States she is having pain throughout her body but mostly in the abdomen.  States she continues to have pain around the left ear but states it has decreased.  No fevers at home per patient.  No cough or shortness of breath.  Past Medical History:  Diagnosis Date  . Anxiety and depression 09/13/2006   Qualifier: Diagnosis of  By: Hassell Done [name], Tori Milks    . Arthritis    joint pain   . Asthma   . COPD (chronic obstructive pulmonary disease) (HCC)    chronic bronchitis   . Depression   . Diabetes mellitus    since age 32; type 2 IDDM  . Diabetic neuropathy, painful ([place])    FEET AND HANDS  . Diabetic retinopathy ([place])   . Diastolic dysfunction    a.  Echo 11/17: EF 60-65%, mild LVH, no RWMA, Gr1DD, mild AI, dilated aortic root measuring 38 mm, mildly dilated ascending aorta  . Dilated aortic root ([place])    74m by echo 11/2015  . Dizziness    secondary to diabetes and hypertension   . Epilepsy idiopathic petit mal ([place]    last seizure 2012;controlled w/ topomax  . GERD (gastroesophageal reflux disease)   . Glaucoma    NOT ON ANY EYE DROPS   . Headache(784.0)    migraines   . Heart murmur    born with   . History of stress test    a. 11/17: Normal perfusion, EF 53%, normal study  . Hx of blood clots    hematomas removed from left side of brain from [id number]   . Hyperlipidemia   . Hypertension   . Legally blind    left eye   . MIGRAINE HEADACHE 09/14/2006   Qualifier: Diagnosis of  By: MarHassell DoneP, NykTori Milks . Myocardial infarction ([place]  a.  Patient reported history of without objective documentation  . Nephrolithiasis    frequent urination , urination at nite  PT SEEN IN ER 09/22/11 FOR BACK AND RT SIDED PAIN--HAS KNOWN STONE RT URETER AND UA IN ER SHOWED UTI  . Pain 09/23/11   LOWER BACK AND RIGHT SIDE--PT HAS RIGHT URETERAL STONE  . Pneumonia    hx of 2009  . Pyelonephritis   . Seizures ([place] . Sleep apnea    [facility] ; mild  . Stress incontinence   . [facility]  [facility]  RESIDUAL LEFT LEG WEAKNESS--NO OTHER RESIDUAL PROBLEMS    Patient Active Problem List   Diagnosis Date Noted  . Vitamin D deficiency 01/24/2018  . Vulvovaginal candidiasis 01/24/2018  . Hypokalemia 06/08/2017  .  Overactive bladder 05/18/2017  . Asthma 06/22/2016  . Dilated aortic root ([place])   . Sacroiliac dysfunction 10/09/2014  . Chronic low back pain 09/03/2014  . CVA, old, hemiparesis ([place]) 09/03/2014  . OSA (obstructive sleep apnea) 05/15/2014  . Restrictive airway disease 05/15/2014  . Colitis 02/22/2014  . Type 2 diabetes, uncontrolled, with renal manifestation ([place]) 01/21/2014  . Chronic pain disorder 01/21/2014  . Headache disorder 09/07/2013  . Diabetic neuropathy ([place]) 05/23/2013  . Mixed urge and stress incontinence 08/25/2011  . Ureteral stricture, right 08/25/2011  . Pyelonephritis 08/22/2011  . Nephrolithiasis 08/22/2011  . Incisional hernia 05/10/2011  . Morbid obesity ([place]) 05/10/2011  . Tachycardia 05/08/2011  . BLINDNESS, [place] DEFINITION 09/14/2006  . Hyperlipemia  09/13/2006  . Essential hypertension 09/13/2006  . MYOCARDIAL INFARCTION, HX OF 09/13/2006  . GERD 09/13/2006  . Seizure disorder ([place]) 09/13/2006    Past Surgical History:  Procedure Laterality Date  . BRAIN HEMATOMA EVACUATION     five procedures total, first procedure when 87 months old, last at 52 years of age.  . CYSTOSCOPY W/ RETROGRADES  09/24/2011   Procedure: CYSTOSCOPY WITH RETROGRADE PYELOGRAM;  Surgeon: Malka So, MD;  Location: [facility];  Service: Urology;  Laterality: Right;  . CYSTOSCOPY WITH URETEROSCOPY  09/24/2011   Procedure: CYSTOSCOPY WITH URETEROSCOPY;  Surgeon: Malka So, MD;  Location: [facility];  Service: Urology;  Laterality: Right;  Balloon dilation right ureter   . CYSTOSCOPY/RETROGRADE/URETEROSCOPY  08/24/2011   Procedure: CYSTOSCOPY/RETROGRADE/URETEROSCOPY;  Surgeon: Molli Hazard, MD;  Location: [facility];  Service: Urology;  Laterality: Right;  Cysto, Right retrograde Pyelogram, right stent placement.   Fatima Blank HERNIA REPAIR  05/05/2011   Procedure: LAPAROSCOPIC INCISIONAL HERNIA;  Surgeon: Rolm Bookbinder, MD;  Location: [place];  Service: General;  Laterality: N/A;  . kidney stone removal    . MASS EXCISION  05/05/2011   Procedure: EXCISION MASS;  Surgeon: Rolm Bookbinder, MD;  Location: [place];  Service: General;  Laterality: Right;  . PCNL    . RETINAL DETACHMENT SURGERY Left 1990  . SHUNT REMOVAL     shunt inserted at age 61 removed at age 19   . VAGINAL HYSTERECTOMY  1996    Prior to Admission medications   Medication Sig Start Date End Date Taking? Authorizing Provider  albuterol (PROAIR HFA) 108 (90 Base) MCG/ACT inhaler INHALE 2 PUFFS BY MOUTH EVERY 6 HOURS AS NEEDED FOR WHEEZING OR SHORTNESS OF BREATH 01/24/18   Hubbard Hartshorn, [name]  AMBULATORY NON FORMULARY MEDICATION Medication Name: Muscle and Joint Vanishing Scent Gel- Apply as needed to skin    [provider]  amLODipine (NORVASC) 5 MG tablet Take 1 tablet (5 mg total) by mouth  daily. 10/12/18   Delsa Grana, [name]  baclofen (LIORESAL) 10 MG tablet Take 1 tablet (10 mg total) by mouth 3 (three) times daily. 07/21/18   Hubbard Hartshorn, [name]  Blood Glucose Monitoring Suppl (FIFTY50 GLUCOSE METER 2.0) w/Device KIT Accuchek 03/02/18 03/02/19  [provider]  butalbital-acetaminophen-caffeine (FIORICET, ESGIC) 50-325-40 MG tablet Take 1-2 tablets by mouth every 6 (six) hours as needed. 02/04/18 02/04/19  Earleen Newport, MD  Calcium Carbonate-Vitamin D (CALCIUM-VITAMIN D3 PO) D3 500- calcium 630 mg Take 1 capsule by mouth once daily    [provider]  cephALEXin (KEFLEX) 500 MG capsule Take 1 capsule (500 mg total) by mouth 3 (three) times daily. 11/10/18   Nance Pear, MD  cimetidine (TAGAMET) 300 MG tablet Take 1  tablet (300 mg total) by mouth 2 (two) times daily. 07/21/18   Hubbard Hartshorn, [name]  ciprofloxacin (CIPRO) 500 MG tablet Take 1 tablet (500 mg total) by mouth 2 (two) times daily for 10 days. 11/29/18 12/09/18  Lavonia Drafts, MD  clopidogrel (PLAVIX) 75 MG tablet Take 1 tablet (75 mg total) by mouth daily. 10/07/18   Steele Sizer, MD  cycloSPORINE (RESTASIS) 0.05 % ophthalmic emulsion Place 2 drops into both eyes 2 (two) times daily.    [provider]  Dulaglutide 1.5 MG/0.5ML SOPN Inject 1.5 mg into the skin once a week. 10/20/18   Hubbard Hartshorn, [name]  DULoxetine (CYMBALTA) 20 MG capsule Take 1 capsule (20 mg total) by mouth daily. 10/12/18   Delsa Grana, [name]  ezetimibe (ZETIA) 10 MG tablet Take 1 tablet (10 mg total) by mouth daily. 10/13/18   Delsa Grana, [name]  fluorometholone (FML) 0.1 % ophthalmic suspension Place 2 drops into both eyes 2 (two) times daily.    [provider]  fluticasone (FLONASE) 50 MCG/ACT nasal spray Place 2 sprays into both nostrils daily. 10/12/18   Delsa Grana, [name]  Fluticasone-Salmeterol (ADVAIR DISKUS) 250-50 MCG/DOSE AEPB Inhale 1 puff into the lungs 2 (two) times daily. 03/28/18   Hubbard Hartshorn,  [name]  gabapentin (NEURONTIN) 400 MG capsule Take 400 mg by mouth 3 (three) times daily. 07/08/18   [provider]  glucose blood (PRECISION QID TEST) test strip Accuchek brand 03/02/18 03/02/19  [provider]  insulin aspart (NOVOLOG FLEXPEN) 100 UNIT/ML FlexPen Inject into the skin. 06/08/18 06/08/19  [provider]  Insulin Glargine, 2 Unit Dial, 300 UNIT/ML SOPN Inject 120 Units into the skin daily. 03/30/18   Hubbard Hartshorn, [name]  Insulin Pen Needle (EASY TOUCH PEN NEEDLES) 31G X 8 MM MISC Use as directed in giving insulin. Dx: E11.29 03/25/18   Hubbard Hartshorn, [name]  levETIRAcetam (KEPPRA) 500 MG tablet Take 500 mg by mouth 2 (two) times daily. 07/08/18   [provider]  losartan (COZAAR) 100 MG tablet Take 1 tablet (100 mg total) by mouth daily. 07/21/18   Hubbard Hartshorn, [name]  mirabegron ER (MYRBETRIQ) 50 MG TB24 tablet Take 1 tablet (50 mg total) by mouth daily. 10/20/18   Hubbard Hartshorn, [name]  ondansetron (ZOFRAN ODT) 4 MG disintegrating tablet Take 1 tablet (4 mg total) by mouth every 8 (eight) hours as needed. 11/14/18   Hubbard Hartshorn, [name]  [facility] DELICA LANCETS 87O MISC Use to check sugar three times daily as directed.Dx: E11.29; E11.40 10/21/16   Gildardo Cranker, DO  oxyCODONE-acetaminophen (PERCOCET) 5-325 MG tablet Take 1 tablet by mouth every 4 (four) hours as needed for moderate pain or severe pain. 11/06/18 11/06/19  Nena Polio, MD  pantoprazole (PROTONIX) 40 MG tablet Take 1 tablet (40 mg total) by mouth daily. 07/21/18   Hubbard Hartshorn, [name]  polyethylene glycol (MIRALAX / GLYCOLAX) packet Take 17 g by mouth daily as needed for mild constipation. 07/30/16   Lauree Chandler, NP  potassium chloride SA (KLOR-CON M20) 20 MEQ tablet Take 2 tablets (40 mEq total) by mouth daily. 10/12/18   Delsa Grana, [name]  predniSONE (DELTASONE) 10 MG tablet Take 4 tablets (40 mg total) by mouth daily for 3 days, THEN 3 tablets (30 mg total) daily for 3 days, THEN 2 tablets  (20 mg total) daily for 3 days, THEN 1 tablet (10 mg total) daily for 3 days. 11/29/18 12/11/18  Lavonia Drafts, MD  prochlorperazine (COMPAZINE) 10 MG tablet Take 1 tablet (10 mg total) by mouth every 6 (six) hours as needed for nausea or vomiting. 11/06/18   Nena Polio, MD  simvastatin (ZOCOR) 20 MG tablet Take 1 tablet (20 mg total) by mouth at bedtime. 10/13/18   Delsa Grana, [name]  sucralfate (CARAFATE) 1 g tablet Take 1 tablet (1 g total) by mouth 4 (four) times daily for 10 days. 11/07/18 11/17/18  Lavonia Drafts, MD  sulfamethoxazole-trimethoprim (BACTRIM DS) 800-160 MG tablet Take 1 tablet by mouth 2 (two) times daily for 10 days. 11/29/18 12/09/18  Lavonia Drafts, MD  terconazole (TERAZOL 7) 0.4 % vaginal cream Place 1 applicator vaginally at bedtime. 07/21/18   Hubbard Hartshorn, [name]    Allergies  Allergen Reactions  . Codeine Swelling and Other (See Comments)    Swelling and burning of mouth (inside)  . Lactose Intolerance (Gi) Nausea And Vomiting    Family History  Problem Relation Age of Onset  . Uterine cancer Mother   . Hypertension Mother   . Cancer Mother   . Brain cancer Maternal Grandmother   . Hypertension Maternal Grandmother   . Birth defects Daughter   . Hypertension Daughter   . Cirrhosis Maternal Grandfather   . ADD / ADHD Maternal Grandfather   . Birth defects Maternal Grandfather   . Diabetes Maternal Grandfather   . Anesthesia problems Neg Hx   . Colon cancer Neg Hx   . Esophageal cancer Neg Hx   . Pancreatic cancer Neg Hx     Social History Social History   Tobacco Use  . Smoking status: Former Smoker    Packs/day: 1.00    Years: 15.00    Pack years: 15.00    Types: Cigarettes    Quit date: 01/20/1987    Years since quitting: 31.8  . Smokeless tobacco: Never Used  . Tobacco comment: quit more than 20years  Substance Use Topics  . Alcohol use: Yes    Alcohol/week: 0.0 standard drinks    Comment: 1 glass weekly  . Drug use: No    Comment:  hx of marijuana use no longer uses     Review of Systems Constitutional: Negative for fever.  Intermittent dizziness. ENT: Mild continued left ear pain. Cardiovascular: Negative for chest pain. Respiratory: Negative for shortness of breath.  No cough. Gastrointestinal: Mild diffuse abdominal pain.  Intermittent diarrhea and nausea.  Musculoskeletal: Negative for musculoskeletal complaints Neurological: Negative for headache All other ROS negative  ____________________________________________   PHYSICAL EXAM:  VITAL SIGNS: ED Triage Vitals  Enc Vitals Group     BP 12/05/18 1011 134/88     Pulse Rate 12/05/18 1011 81     Resp 12/05/18 1011 18     Temp 12/05/18 1011 98.7 F (37.1 C)     Temp Source 12/05/18 1011 Oral     SpO2 12/05/18 1011 98 %     Weight 12/05/18 0936 222 lb (100.7 kg)     Height 12/05/18 0936 _0  (1.676 m)     Head Circumference --      Peak Flow --      Pain Score 12/05/18 0936 10     Pain Loc --      Pain Edu? --      Excl. in [place]? --     Constitutional: Alert and oriented. Well appearing and in no distress. Eyes: Normal exam ENT      Head: Normocephalic and atraumatic.  Normal tympanic membrane.  Mild  tenderness to the mastoid process of the left ear no redness or swelling.      Mouth/Throat: Mucous membranes are moist. Cardiovascular: Normal rate, regular rhythm. No murmur Respiratory: Normal respiratory effort without tachypnea nor retractions. Breath sounds are clear Gastrointestinal: Soft, mild tenderness diffusely.  No focal tenderness identified.  No rebound guarding or distention. Musculoskeletal: Nontender with normal range of motion in all extremities.  Neurologic:  Normal speech and language. No gross focal neurologic deficits Skin:  Skin is warm, dry and intact.  Psychiatric: Mood and affect are normal.   ____________________________________________    EKG  EKG viewed and interpreted by myself shows a normal sinus rhythm 86 bpm  with a narrow QRS, normal axis, normal intervals, no concerning ST changes.  ____________________________________________    RADIOLOGY  CT scan shows no acute findings.  ____________________________________________   INITIAL IMPRESSION / ASSESSMENT AND PLAN / ED COURSE  Pertinent labs & imaging results that were available during my care of the patient were reviewed by me and considered in my medical decision making (see chart for details).   Patient presents to the emergency department for intermittent dizziness diarrhea and generally not feeling well also reported some abdominal pain starting today.  Patient did recently finish a course of antibiotics for mastoiditis she does have mild tenderness to the mastoid processes left ear, normal auditory canal and tympanic membrane, no erythema or swelling.  Patient has follow-up with ENT this week.  Patient's lab work is reassuring with a normal white blood cell count, normal anion gap.  We will obtain a CT scan of the abdomen as a precaution.  We will dose pain medication in the emergency department.  If patient CT is negative I would anticipate likely discharge home with continued supportive care and ENT follow-up.  CT shows no acute findings.  We will discharge with short course of pain and nausea medication have the patient follow-up with her PCP as well as ENT this week.  Patient agreeable to plan of care.   Baileigh Modisette was evaluated in Emergency Department on 12/05/2018 for the symptoms described in the history of present illness. She was evaluated in the context of the global COVID-19 pandemic, which necessitated consideration that the patient might be at risk for infection with the SARS-CoV-2 virus that causes COVID-19. Institutional protocols and algorithms that pertain to the evaluation of patients at risk for COVID-19 are in a state of rapid change based on information released by regulatory bodies including the CDC and federal  and state organizations. These policies and algorithms were followed during the patient's care in the ED.  ____________________________________________   FINAL CLINICAL IMPRESSION(S) / ED DIAGNOSES  Diarrhea Dizziness   Harvest Dark, MD 12/05/18 1333

## 2018-12-05 NOTE — ED Notes (Signed)
Pt assisted to toilet via wheelchair, pt reports she is having some dizziness so requires wheelchair for transfer

## 2018-12-05 NOTE — ED Triage Notes (Signed)
Pt reports feels dizzy, weak and has been having diarrhea. Pt reports symptoms for awhile now and has been seen in the ED multiple times for the same.

## 2018-12-05 NOTE — ED Notes (Signed)
Pt requested BG checked, BG 136

## 2018-12-06 ENCOUNTER — Ambulatory Visit
Payer: Medicare HMO | Attending: Student in an Organized Health Care Education/Training Program | Admitting: Student in an Organized Health Care Education/Training Program

## 2018-12-06 ENCOUNTER — Other Ambulatory Visit: Payer: Self-pay | Admitting: Student in an Organized Health Care Education/Training Program

## 2018-12-06 ENCOUNTER — Encounter: Payer: Self-pay | Admitting: Student in an Organized Health Care Education/Training Program

## 2018-12-06 VITALS — BP 135/89 | HR 90 | Temp 97.7°F | Resp 16 | Ht 66.0 in | Wt 222.0 lb

## 2018-12-06 DIAGNOSIS — E1142 Type 2 diabetes mellitus with diabetic polyneuropathy: Secondary | ICD-10-CM | POA: Diagnosis not present

## 2018-12-06 DIAGNOSIS — G894 Chronic pain syndrome: Secondary | ICD-10-CM | POA: Diagnosis not present

## 2018-12-06 DIAGNOSIS — M791 Myalgia, unspecified site: Secondary | ICD-10-CM | POA: Insufficient documentation

## 2018-12-06 DIAGNOSIS — G890 Central pain syndrome: Secondary | ICD-10-CM | POA: Diagnosis not present

## 2018-12-06 MED ORDER — DULOXETINE HCL 20 MG PO CPEP: 40.0000 mg | ORAL_CAPSULE | Freq: Every day | ORAL | 2 refills | Status: DC

## 2018-12-06 MED ORDER — GABAPENTIN 400 MG PO CAPS: ORAL_CAPSULE | ORAL | 2 refills | Status: DC

## 2018-12-06 NOTE — Progress Notes (Signed)
+Patient's Name: [name]  MRN: [id number]  Referring Provider: Delsa Grana, [name]  DOB: Jan 07, 1966  PCP: Hubbard Hartshorn, [name]  DOS: 12/06/2018  Note by: Gillis Santa, MD  Service setting: Ambulatory outpatient  Specialty: Interventional Pain Management  Location: [facility] (AMB) Pain Management Facility  Visit type: Initial Patient Evaluation  Patient type: New Patient   Primary Reason(s) for Visit: Encounter for initial evaluation of one or more chronic problems (new to examiner) potentially causing chronic pain, and posing a threat to normal musculoskeletal function. (Level of risk: High) CC: Back Pain (upper, lower, left side)  HPI  [name] is a 53 y.o. year old, female patient, who comes today to see [name] for the first time for an initial evaluation of her chronic pain. She has Hyperlipemia; BLINDNESS, LEGAL, [place] DEFINITION; Essential hypertension; MYOCARDIAL INFARCTION, HX OF; GERD; Seizure disorder ([place]); Tachycardia; Incisional hernia; Morbid obesity ([place]); Pyelonephritis; Nephrolithiasis; Mixed urge and stress incontinence; Ureteral stricture, right; Diabetic neuropathy ([place]); Headache disorder; Type 2 diabetes, uncontrolled, with renal manifestation ([place]); Chronic pain disorder; Colitis; OSA (obstructive sleep apnea); Restrictive airway disease; Chronic low back pain; CVA, old, hemiparesis ([place]); Sacroiliac dysfunction; Dilated aortic root ([place]); Asthma; Overactive bladder; Hypokalemia; Vitamin D deficiency; and Vulvovaginal candidiasis on their problem list. Today she comes in for evaluation of her Back Pain (upper, lower, left side)  Pain Assessment: Location: Left, Lower, Upper Back Radiating: left arm to the hand, left leg to the foot Onset: More than a month ago Duration: Chronic pain Quality: Sharp, Burning Severity: 9 /10 (subjective, self-reported pain score)  Note: Reported level is inconsistent with clinical observations.                         When using our objective  Pain Scale, levels between 6 and 10/10 are said to belong in an emergency room, as it progressively worsens from a 6/10, described as severely limiting, requiring emergency care not usually available at an outpatient pain management facility. At a 6/10 level, communication becomes difficult and requires great effort. Assistance to reach the emergency department may be required. Facial flushing and profuse sweating along with potentially dangerous increases in heart rate and blood pressure will be evident. Effect on ADL: difficulty performing daily activities Timing:   Modifying factors: hot bath with Epsom salt BP: 135/89  HR: 90  Onset and Duration: Gradual and Present longer than 3 months Cause of pain: Unknown Severity: Getting worse, NAS-11 at its worse: 10/10, NAS-11 at its best: 8/10, NAS-11 now: 8/10 and NAS-11 on the average: 10/10 Timing: Morning, Night and After activity or exercise Aggravating Factors: Bowel movements, Kneeling, Walking uphill and Walking downhill Alleviating Factors: Hot packs, Medications and Resting Associated Problems: Dizziness, Nausea, Vomiting , Weakness, Pain that wakes patient up and Pain that does not allow patient to sleep Quality of Pain: Burning, Deep, Dull and Sharp Previous Examinations or Tests: CT scan and MRI scan Previous Treatments: Steroid treatments by mouth  The patient comes into the clinics today for the first time for a chronic pain management evaluation.   Patient is a pleasant 53 year old female with a complex neurologic history who presents with a chief complaint of whole body pain more pronounced on her left side.  Of note patient has had a total of 8 strokes in the past.  She has residual left-sided sensory and motor deficits.  Of note, patient does have a significant seizure history during her infancy.  She had multiple  surgeries, intracranial as a child for intracranial thrombotic events.  She does have a strong family history for  strokes as she states that her maternal uncle died early of a stroke.  She is on Cymbalta 20 mg daily along with gabapentin 400 mg 3 times daily.  She does find mild benefit with these medications but states that they are not as helpful as she likes.  She does have difficulty walking and ambulating as a result of her left-sided weakness from stroke.  She is on Plavix.  She is not on chronic opioid therapy.  Historic Controlled Substance Pharmacotherapy Review   Historical Monitoring: The patient  reports no history of drug use. List of all UDS Test(s): Lab Results  Component Value Date   MDMA NEG 09/03/2014   COCAINSCRNUR NEG 09/03/2014   THCU NEG 09/03/2014   List of other Serum/Urine Drug Screening Test(s):  Lab Results  Component Value Date   COCAINSCRNUR NEG 09/03/2014   THCU NEG 09/03/2014   Historical Background Evaluation: [place] PMP: PDMP reviewed during this encounter. Six (6) year initial data search conducted.             [place] Department of public safety, offender search: Editor, commissioning Information) Non-contributory Risk Assessment Profile: Aberrant behavior: None observed or detected today Risk factors for fatal opioid overdose: None identified today Fatal overdose hazard ratio (HR): Calculation deferred Non-fatal overdose hazard ratio (HR): Calculation deferred Risk of opioid abuse or dependence: 0.7-3.0% with doses ? 36 MME/day and 6.1-26% with doses ? 120 MME/day. Substance use disorder (SUD) risk level: Moderate Personal History of Substance Abuse (SUD-Substance use disorder):  Alcohol: Positive Female or Female  Illegal Drugs: Negative  Rx Drugs: Negative  ORT Risk Level calculation: Moderate Risk Opioid Risk Tool - 12/06/18 1012      Family History of Substance Abuse   Alcohol  Positive Female    Illegal Drugs  Negative    Rx Drugs  Negative      Personal History of Substance Abuse   Alcohol  Positive Female or Female    Illegal Drugs  Negative    Rx Drugs  Negative       Age   Age between 48-45 years   No      History of Preadolescent Sexual Abuse   History of Preadolescent Sexual Abuse  Negative or Female      Psychological Disease   Psychological Disease  Negative    Depression  Positive      Total Score   Opioid Risk Tool Scoring  5    Opioid Risk Interpretation  Moderate Risk      ORT Scoring interpretation table:  Score <3 = Low Risk for SUD  Score between 4-7 = Moderate Risk for SUD  Score >8 = High Risk for Opioid Abuse   PHQ-2 Depression Scale:  Total score: 0  PHQ-2 Scoring interpretation table: (Score and probability of major depressive disorder)  Score 0 = No depression  Score 1 = 15.4% Probability  Score 2 = 21.1% Probability  Score 3 = 38.4% Probability  Score 4 = 45.5% Probability  Score 5 = 56.4% Probability  Score 6 = 78.6% Probability   PHQ-9 Depression Scale:  Total score: 0  PHQ-9 Scoring interpretation table:  Score 0-4 = No depression  Score 5-9 = Mild depression  Score 10-14 = Moderate depression  Score 15-19 = Moderately severe depression  Score 20-27 = Severe depression (2.4 times higher risk of SUD and 2.89 times higher risk  of overuse)   Pharmacologic Plan: As per protocol, I have not taken over any controlled substance management, pending the results of ordered tests and/or consults.            Initial impression: Pending review of available data and ordered tests.  Meds   Current Outpatient Medications:  .  albuterol (PROAIR HFA) 108 (90 Base) MCG/ACT inhaler, INHALE 2 PUFFS BY MOUTH EVERY 6 HOURS AS NEEDED FOR WHEEZING OR SHORTNESS OF BREATH, Disp: 3 Inhaler, Rfl: 3 .  AMBULATORY NON FORMULARY MEDICATION, Medication Name: Muscle and Joint Vanishing Scent Gel- Apply as needed to skin, Disp: , Rfl:  .  amLODipine (NORVASC) 5 MG tablet, Take 1 tablet (5 mg total) by mouth daily., Disp: 90 tablet, Rfl: 3 .  baclofen (LIORESAL) 10 MG tablet, Take 1 tablet (10 mg total) by mouth 3 (three) times daily., Disp: 90  tablet, Rfl: 6 .  Blood Glucose Monitoring Suppl (FIFTY50 GLUCOSE METER 2.0) w/Device KIT, Accuchek, Disp: , Rfl:  .  butalbital-acetaminophen-caffeine (FIORICET, ESGIC) 50-325-40 MG tablet, Take 1-2 tablets by mouth every 6 (six) hours as needed., Disp: 20 tablet, Rfl: 0 .  Calcium Carbonate-Vitamin D (CALCIUM-VITAMIN D3 PO), D3 500- calcium 630 mg Take 1 capsule by mouth once daily, Disp: , Rfl:  .  cimetidine (TAGAMET) 300 MG tablet, Take 1 tablet (300 mg total) by mouth 2 (two) times daily., Disp: 180 tablet, Rfl: 1 .  clopidogrel (PLAVIX) 75 MG tablet, Take 1 tablet (75 mg total) by mouth daily., Disp: 90 tablet, Rfl: 0 .  cycloSPORINE (RESTASIS) 0.05 % ophthalmic emulsion, Place 2 drops into both eyes 2 (two) times daily., Disp: , Rfl:  .  Dulaglutide 1.5 MG/0.5ML SOPN, Inject 1.5 mg into the skin once a week., Disp: 12 pen, Rfl: 3 .  DULoxetine (CYMBALTA) 20 MG capsule, Take 2 capsules (40 mg total) by mouth daily., Disp: 60 capsule, Rfl: 2 .  ezetimibe (ZETIA) 10 MG tablet, Take 1 tablet (10 mg total) by mouth daily., Disp: 90 tablet, Rfl: 0 .  fluorometholone (FML) 0.1 % ophthalmic suspension, Place 2 drops into both eyes 2 (two) times daily., Disp: , Rfl:  .  fluticasone (FLONASE) 50 MCG/ACT nasal spray, Place 2 sprays into both nostrils daily., Disp: 16 g, Rfl: 3 .  Fluticasone-Salmeterol (ADVAIR DISKUS) 250-50 MCG/DOSE AEPB, Inhale 1 puff into the lungs 2 (two) times daily., Disp: 1 each, Rfl: 3 .  gabapentin (NEURONTIN) 400 MG capsule, 400 mg qAM, 400 mg qPM, 800 mg qhs, Disp: 120 capsule, Rfl: 2 .  glucose blood (PRECISION QID TEST) test strip, Accuchek brand, Disp: , Rfl:  .  insulin aspart (NOVOLOG FLEXPEN) 100 UNIT/ML FlexPen, Inject into the skin., Disp: , Rfl:  .  Insulin Glargine, 2 Unit Dial, 300 UNIT/ML SOPN, Inject 120 Units into the skin daily. (Patient taking differently: Inject 136 Units into the skin daily. ), Disp: 15 mL, Rfl: 1 .  Insulin Pen Needle (EASY TOUCH PEN  NEEDLES) 31G X 8 MM MISC, Use as directed in giving insulin. Dx: E11.29, Disp: 400 each, Rfl: 3 .  levETIRAcetam (KEPPRA) 500 MG tablet, Take 500 mg by mouth 2 (two) times daily., Disp: , Rfl:  .  losartan (COZAAR) 100 MG tablet, Take 1 tablet (100 mg total) by mouth daily., Disp: 90 tablet, Rfl: 3 .  mirabegron ER (MYRBETRIQ) 50 MG TB24 tablet, Take 1 tablet (50 mg total) by mouth daily., Disp: 90 tablet, Rfl: 1 .  ondansetron (ZOFRAN ODT) 4 MG disintegrating  tablet, Take 1 tablet (4 mg total) by mouth every 8 (eight) hours as needed for nausea or vomiting., Disp: 20 tablet, Rfl: 0 .  ONETOUCH DELICA LANCETS 93J MISC, Use to check sugar three times daily as directed.Dx: E11.29; E11.40, Disp: 300 each, Rfl: 3 .  oxyCODONE-acetaminophen (PERCOCET) 5-325 MG tablet, Take 1 tablet by mouth every 4 (four) hours as needed for severe pain., Disp: 12 tablet, Rfl: 0 .  pantoprazole (PROTONIX) 40 MG tablet, Take 1 tablet (40 mg total) by mouth daily., Disp: 90 tablet, Rfl: 0 .  polyethylene glycol (MIRALAX / GLYCOLAX) packet, Take 17 g by mouth daily as needed for mild constipation., Disp: 100 each, Rfl: 3 .  potassium chloride SA (KLOR-CON M20) 20 MEQ tablet, Take 2 tablets (40 mEq total) by mouth daily., Disp: 60 tablet, Rfl: 0 .  prochlorperazine (COMPAZINE) 10 MG tablet, Take 1 tablet (10 mg total) by mouth every 6 (six) hours as needed for nausea or vomiting., Disp: 30 tablet, Rfl: 0 .  simvastatin (ZOCOR) 20 MG tablet, Take 1 tablet (20 mg total) by mouth at bedtime., Disp: 90 tablet, Rfl: 0 .  terconazole (TERAZOL 7) 0.4 % vaginal cream, Place 1 applicator vaginally at bedtime., Disp: 45 g, Rfl: 0 .  predniSONE (DELTASONE) 10 MG tablet, Take 4 tablets (40 mg total) by mouth daily for 3 days, THEN 3 tablets (30 mg total) daily for 3 days, THEN 2 tablets (20 mg total) daily for 3 days, THEN 1 tablet (10 mg total) daily for 3 days. (Patient not taking: Reported on 12/06/2018), Disp: 30 tablet, Rfl: 0 .   sucralfate (CARAFATE) 1 g tablet, Take 1 tablet (1 g total) by mouth 4 (four) times daily for 10 days., Disp: 40 tablet, Rfl: 0 .  sulfamethoxazole-trimethoprim (BACTRIM DS) 800-160 MG tablet, Take 1 tablet by mouth 2 (two) times daily for 10 days. (Patient not taking: Reported on 12/06/2018), Disp: 20 tablet, Rfl: 0  Imaging Review    Lumbar DG 2-3 views:  Results for orders placed during the hospital encounter of 09/10/14  DG Lumbar Spine 2-3 Views   Narrative CLINICAL DATA:  Six-month history of right-sided low back pain without known injury, no radicular symptoms  EXAM: LUMBAR SPINE - 2-3 VIEW  COMPARISON:  Abdominal film of July 10, 2014 and CT scan of the abdomen and pelvis with coronal and sagittal reconstructions through the lumbar spine dated Jun 18, 2014.  FINDINGS: The lumbar vertebral bodies are preserved in height. There is mild disc space narrowing at L4-5. There are anterior bridging osteophytes in the lower thoracic spine and at T12-L1. There is no spondylolisthesis. There is facet joint hypertrophy at L4-5 and at L5-S1. The pedicles and transverse processes are intact. The observed portions of the sacrum are normal.  IMPRESSION: There is mild degenerative disease centered at L4-5. Mild facet joint hypertrophy at this level and at L5-S1 is demonstrated.   Electronically Signed   By: David  Martinique M.D.   On: 09/10/2014 10:06      Complexity Note: Imaging results reviewed. Results shared with [name], using Layman's terms.                         ROS  Cardiovascular: Heart trouble, High blood pressure and Chest pain Pulmonary or Respiratory: Wheezing and difficulty taking a deep full breath (Asthma), Shortness of breath and Temporary stoppage of breathing during sleep Neurological: Seizure disorder, Seizures (Epilepsy), Stroke (Residual deficits or weakness: left  side) and Incontinence:  Urinary Review of Past Neurological Studies:  Results for orders  placed or performed during the hospital encounter of 11/28/18  MR Brain Wo Contrast   Addendum: 11/28/2018   ADDENDUM REPORT: 11/28/2018 21:26  ADDENDUM: These results will be called to the ordering clinician or representative by the Radiologist Assistant, and communication documented in the PACS or zVision Dashboard.   Electronically Signed   By: Ulyses Jarred M.D.   On: 11/28/2018 21:26     Narrative   CLINICAL DATA:  Dizziness with recurrent syncope and head pain. History of seizure.  EXAM: MRI HEAD WITHOUT CONTRAST  TECHNIQUE: Multiplanar, multiecho pulse sequences of the brain and surrounding structures were obtained without intravenous contrast.  COMPARISON:  Brain MRI 08/03/2018  FINDINGS: BRAIN: There is no acute infarct, acute hemorrhage or extra-axial collection. The white matter signal is normal for the patient's age. The cerebral and cerebellar volume are age-appropriate. There is no hydrocephalus. The midline structures are normal. The hippocampi are normal and symmetric in size and signal. The hypothalamus and mamillary bodies are normal. There is no cortical ectopia or dysplasia.  VASCULAR: The major intracranial arterial and venous sinus flow voids are normal. Susceptibility-sensitive sequences show no chronic microhemorrhage or superficial siderosis.  SKULL AND UPPER CERVICAL SPINE: Calvarial bone marrow signal is normal. There is no skull base mass. The visualized upper cervical spine and soft tissues are normal.  SINUSES/ORBITS: There is a large left mastoid effusion extending to the petrous apex. No coalescence of the mastoid air cells. The orbits are normal.  IMPRESSION: 1. Large left mastoid effusion extending to the petrous apex. This could indicate otomastoiditis or petrous apicitis in the appropriate clinical scenario. 2. No seizure etiology demonstrated.  Electronically Signed: By: Ulyses Jarred M.D. On: 11/28/2018 21:13   Results  for orders placed or performed during the hospital encounter of 02/04/18  CT Head Wo Contrast   Narrative   CLINICAL DATA:  Acute severe headache  EXAM: CT HEAD WITHOUT CONTRAST  TECHNIQUE: Contiguous axial images were obtained from the base of the skull through the vertex without intravenous contrast.  COMPARISON:  None.  FINDINGS: Brain: No evidence of acute infarction, hemorrhage, hydrocephalus, extra-axial collection or mass lesion/mass effect. Mild generalized cerebral atrophy.  Vascular: No hyperdense vessel or unexpected calcification.  Skull: No osseous abnormality.  Sinuses/Orbits: Visualized paranasal sinuses are clear. Visualized mastoid sinuses are clear. Visualized orbits demonstrate no focal abnormality.  Other: None  IMPRESSION: No acute intracranial pathology.   Electronically Signed   By: Kathreen Devoid   On: 02/04/2018 19:06    Psychological-Psychiatric: No reported psychological or psychiatric signs or symptoms such as difficulty sleeping, anxiety, depression, delusions or hallucinations (schizophrenial), mood swings (bipolar disorders) or suicidal ideations or attempts Gastrointestinal: No reported gastrointestinal signs or symptoms such as vomiting or evacuating blood, reflux, heartburn, alternating episodes of diarrhea and constipation, inflamed or scarred liver, or pancreas or irrregular and/or infrequent bowel movements Genitourinary: Passing kidney stones, Peeing blood and Recurrent Urinary Tract infections Hematological: No reported hematological signs or symptoms such as prolonged bleeding, low or poor functioning platelets, bruising or bleeding easily, hereditary bleeding problems, low energy levels due to low hemoglobin or being anemic Endocrine: No reported endocrine signs or symptoms such as high or low blood sugar, rapid heart rate due to high thyroid levels, obesity or weight gain due to slow thyroid or thyroid disease Rheumatologic: No  reported rheumatological signs and symptoms such as fatigue, joint pain, tenderness, swelling, redness,  heat, stiffness, decreased range of motion, with or without associated rash Musculoskeletal: Negative for myasthenia gravis, muscular dystrophy, multiple sclerosis or malignant hyperthermia Work History: Retired  Allergies  [name] is allergic to codeine and lactose intolerance (gi).  Laboratory Chemistry Profile   Screening Lab Results  Component Value Date   SARSCOV2NAA NEGATIVE 11/07/2018   STAPHAUREUS NEGATIVE 09/23/2011   MRSAPCR NEGATIVE 09/23/2011   HIV NONREACTIVE 11/22/2013   PREGTESTUR NEGATIVE 02/04/2018    Inflammation (CRP: Acute Phase) (ESR: Chronic Phase) Lab Results  Component Value Date   CRP 2.5 11/14/2018   ESRSEDRATE 29 11/14/2018   LATICACIDVEN 0.9 02/22/2014                         Rheumatology Lab Results  Component Value Date   RF <14 11/14/2018   ANA NEGATIVE 11/14/2018                        Renal Lab Results  Component Value Date   BUN 21 (H) 12/05/2018   CREATININE 0.96 12/05/2018   LABCREA 48 40/98/1191   BCR NOT APPLICABLE 47/82/9562   GFR 85.44 05/18/2017   GFRAA >60 12/05/2018   GFRNONAA >60 12/05/2018                             Hepatic Lab Results  Component Value Date   AST 11 (L) 12/05/2018   ALT 14 12/05/2018   ALBUMIN 4.1 12/05/2018   ALKPHOS 81 12/05/2018   LIPASE 32 12/05/2018                        Electrolytes Lab Results  Component Value Date   NA 136 12/05/2018   K 4.4 12/05/2018   CL 104 12/05/2018   CALCIUM 9.1 12/05/2018   MG 2.1 10/12/2018                        Neuropathy Lab Results  Component Value Date   HGBA1C 12.6 (H) 01/24/2018   HIV NONREACTIVE 11/22/2013                        CNS No results found for: COLORCSF, APPEARCSF, RBCCOUNTCSF, WBCCSF, POLYSCSF, LYMPHSCSF, EOSCSF, PROTEINCSF, GLUCCSF, JCVIRUS, CSFOLI, IGGCSF, LABACHR, ACETBL                      Bone Lab Results   Component Value Date   VD25OH 26 (L) 01/24/2018                         Coagulation Lab Results  Component Value Date   INR 1.14 02/23/2014   LABPROT 14.7 02/23/2014   PLT 284 12/05/2018                        Cardiovascular Lab Results  Component Value Date   CKTOTAL 130 11/14/2018   HGB 13.0 12/05/2018   HCT 41.1 12/05/2018                         ID Lab Results  Component Value Date   HIV NONREACTIVE 11/22/2013   [place] NEGATIVE 11/07/2018   STAPHAUREUS NEGATIVE 09/23/2011   MRSAPCR NEGATIVE 09/23/2011   PREGTESTUR NEGATIVE 02/04/2018  Cancer No results found for: CEA, CA125, LABCA2                      Endocrine Lab Results  Component Value Date   TSH 0.62 11/28/2018                        Note: Lab results reviewed.  [place]  Drug: [name]  reports no history of drug use. Alcohol:  reports no history of alcohol use. Tobacco:  reports that she quit smoking about 31 years ago. Her smoking use included cigarettes. She has a 15.00 pack-year smoking history. She has never used smokeless tobacco. Medical:  has a past medical history of Anxiety and depression (09/13/2006), Arthritis, Asthma, COPD (chronic obstructive pulmonary disease) ([place]), Depression, Diabetes mellitus, Diabetic neuropathy, painful ([place]), Diabetic retinopathy ([place]), Diastolic dysfunction, Dilated aortic root ([place]), Dizziness, Epilepsy idiopathic petit mal ([place]), GERD (gastroesophageal reflux disease), Glaucoma, Headache(784.0), Heart murmur, History of stress test, blood clots, Hyperlipidemia, Hypertension, Legally blind, MIGRAINE HEADACHE (09/14/2006), Myocardial infarction ([place]), Nephrolithiasis, Pain (09/23/11), Pneumonia, Pyelonephritis, Seizures ([place]), Sleep apnea, Stress incontinence, and Stroke ([place]). Family: family history includes ADD / ADHD in her maternal grandfather; Birth defects in her daughter and maternal grandfather; Brain cancer in her maternal grandmother; Cancer in her mother;  Cirrhosis in her maternal grandfather; Diabetes in her maternal grandfather; Hypertension in her daughter, maternal grandmother, and mother; Uterine cancer in her mother.  Past Surgical History:  Procedure Laterality Date  . BRAIN HEMATOMA EVACUATION     five procedures total, first procedure when 16 months old, last at 53 years of age.  . CYSTOSCOPY W/ RETROGRADES  09/24/2011   Procedure: CYSTOSCOPY WITH RETROGRADE PYELOGRAM;  Surgeon: Malka So, MD;  Location: [facility];  Service: Urology;  Laterality: Right;  . CYSTOSCOPY WITH URETEROSCOPY  09/24/2011   Procedure: CYSTOSCOPY WITH URETEROSCOPY;  Surgeon: Malka So, MD;  Location: [facility];  Service: Urology;  Laterality: Right;  Balloon dilation right ureter   . CYSTOSCOPY/RETROGRADE/URETEROSCOPY  08/24/2011   Procedure: CYSTOSCOPY/RETROGRADE/URETEROSCOPY;  Surgeon: Molli Hazard, MD;  Location: [facility];  Service: Urology;  Laterality: Right;  Cysto, Right retrograde Pyelogram, right stent placement.   Fatima Blank HERNIA REPAIR  05/05/2011   Procedure: LAPAROSCOPIC INCISIONAL HERNIA;  Surgeon: Rolm Bookbinder, MD;  Location: [facility];  Service: General;  Laterality: N/A;  . kidney stone removal    . MASS EXCISION  05/05/2011   Procedure: EXCISION MASS;  Surgeon: Rolm Bookbinder, MD;  Location: [facility];  Service: General;  Laterality: Right;  . PCNL    . RETINAL DETACHMENT SURGERY Left 1990  . SHUNT REMOVAL     shunt inserted at age 27 removed at age 21   . VAGINAL HYSTERECTOMY  1996   Active Ambulatory Problems    Diagnosis Date Noted  . Hyperlipemia 09/13/2006  . BLINDNESS, [place], [place] DEFINITION 09/14/2006  . Essential hypertension 09/13/2006  . MYOCARDIAL INFARCTION, HX OF 09/13/2006  . GERD 09/13/2006  . Seizure disorder ([place]) 09/13/2006  . Tachycardia 05/08/2011  . Incisional hernia 05/10/2011  . Morbid obesity ([place]) 05/10/2011  . Pyelonephritis 08/22/2011  . Nephrolithiasis 08/22/2011  . Mixed urge and stress incontinence  08/25/2011  . Ureteral stricture, right 08/25/2011  . Diabetic neuropathy ([place]) 05/23/2013  . Headache disorder 09/07/2013  . Type 2 diabetes, uncontrolled, with renal manifestation ([place]) 01/21/2014  . Chronic pain disorder 01/21/2014  . Colitis 02/22/2014  . OSA (obstructive sleep apnea) 05/15/2014  .  Restrictive airway disease 05/15/2014  . Chronic low back pain 09/03/2014  . CVA, old, hemiparesis ([place]) 09/03/2014  . Sacroiliac dysfunction 10/09/2014  . Dilated aortic root ([place])   . Asthma 06/22/2016  . Overactive bladder 05/18/2017  . Hypokalemia 06/08/2017  . Vitamin D deficiency 01/24/2018  . Vulvovaginal candidiasis 01/24/2018   Resolved Ambulatory Problems    Diagnosis Date Noted  . DIABETES MELLITUS, TYPE II 09/30/2006  . Anxiety and depression 09/13/2006  . MIGRAINE HEADACHE 09/14/2006  . CVA 11/14/2007  . DIZZINESS 10/12/2006  . DOE (dyspnea on exertion) 06/06/2013  . Essential hypertension, benign 01/21/2014   Past Medical History:  Diagnosis Date  . Arthritis   . Asthma   . COPD (chronic obstructive pulmonary disease) ([place])   . Depression   . Diabetes mellitus   . Diabetic neuropathy, painful ([place])   . Diabetic retinopathy ([place])   . Diastolic dysfunction   . Dizziness   . Epilepsy idiopathic petit mal ([place])   . GERD (gastroesophageal reflux disease)   . Glaucoma   . Headache(784.0)   . Heart murmur   . History of stress test   . Hx of blood clots   . Hyperlipidemia   . Hypertension   . Legally blind   . Myocardial infarction ([place])   . Pain 09/23/11  . Pneumonia   . Seizures ([place])   . Sleep apnea   . Stress incontinence   . Stroke [facility])    Constitutional Exam  General appearance: alert, cooperative and in mild distress Vitals:   12/06/18 1002  BP: 135/89  Pulse: 90  Resp: 16  Temp: 97.7 F (36.5 C)  TempSrc: Temporal  SpO2: 100%  Weight: 222 lb (100.7 kg)  Height: 5' 6"  (1.676 m)   BMI Assessment: Estimated body mass index is 35.83  kg/m as calculated from the following:   Height as of this encounter: 5' 6"  (1.676 m).   Weight as of this encounter: 222 lb (100.7 kg).  BMI interpretation table: BMI level Category Range association with higher incidence of chronic pain  <18 kg/m2 Underweight   18.5-24.9 kg/m2 Ideal body weight   25-29.9 kg/m2 Overweight Increased incidence by 20%  30-34.9 kg/m2 Obese (Class I) Increased incidence by 68%  35-39.9 kg/m2 Severe obesity (Class II) Increased incidence by 136%  >40 kg/m2 Extreme obesity (Class III) Increased incidence by 254%   Patient's current BMI Ideal Body weight  Body mass index is 35.83 kg/m. Ideal body weight: 59.3 kg (130 lb 11.7 oz) Adjusted ideal body weight: 75.9 kg (167 lb 3.8 oz)   BMI Readings from Last 4 Encounters:  12/06/18 35.83 kg/m  12/05/18 35.83 kg/m  11/29/18 35.83 kg/m  11/28/18 36.82 kg/m   Wt Readings from Last 4 Encounters:  12/06/18 222 lb (100.7 kg)  12/05/18 222 lb (100.7 kg)  11/29/18 222 lb (100.7 kg)  11/28/18 228 lb 1.6 oz (103.5 kg)  Psych/Mental status: Alert, oriented x 3 (person, place, & time)       Eyes: PERLA Respiratory: No evidence of acute respiratory distress  Cervical Spine Area Exam  Skin & Axial Inspection: No masses, redness, edema, swelling, or associated skin lesions Alignment: Symmetrical Functional ROM: Unrestricted ROM      Stability: No instability detected Muscle Tone/Strength: Functionally intact. No obvious neuro-muscular anomalies detected. Sensory (Neurological): Neuropathic pain pattern Palpation: No palpable anomalies              Upper Extremity (UE) Exam    Side: Right upper extremity  Side: Left upper extremity  Skin & Extremity Inspection: Skin color, temperature, and hair growth are WNL. No peripheral edema or cyanosis. No masses, redness, swelling, asymmetry, or associated skin lesions. No contractures.  Skin & Extremity Inspection: Skin color, temperature, and hair growth are WNL. No  peripheral edema or cyanosis. No masses, redness, swelling, asymmetry, or associated skin lesions. No contractures.  Functional ROM: Unrestricted ROM          Functional ROM: Decreased ROM for all joints of upper extremity  Muscle Tone/Strength: Functionally intact. No obvious neuro-muscular anomalies detected.  Muscle Tone/Strength: Functionally intact. No obvious neuro-muscular anomalies detected.  Sensory (Neurological): Unimpaired          Sensory (Neurological): Neurogenic pain pattern          Palpation: No palpable anomalies              Palpation: No palpable anomalies              Provocative Test(s):  Phalen's test: deferred Tinel's test: deferred Apley's scratch test (touch opposite shoulder):  Action 1 (Across chest): deferred Action 2 (Overhead): deferred Action 3 (LB reach): deferred   Provocative Test(s):  Phalen's test: deferred Tinel's test: deferred Apley's scratch test (touch opposite shoulder):  Action 1 (Across chest): Decreased ROM Action 2 (Overhead): Decreased ROM Action 3 (LB reach): Decreased ROM    Thoracic Spine Area Exam  Skin & Axial Inspection: No masses, redness, or swelling Alignment: Symmetrical Functional ROM: Unrestricted ROM Stability: No instability detected Muscle Tone/Strength: Functionally intact. No obvious neuro-muscular anomalies detected. Sensory (Neurological): Musculoskeletal pain pattern Muscle strength & Tone: No palpable anomalies  Lumbar Spine Area Exam  Skin & Axial Inspection: No masses, redness, or swelling Alignment: Symmetrical Functional ROM: Decreased ROM affecting primarily the left Stability: No instability detected Muscle Tone/Strength: Functionally intact. No obvious neuro-muscular anomalies detected. Sensory (Neurological): Musculoskeletal pain pattern Palpation: No palpable anomalies       Provocative Tests: Hyperextension/rotation test: (+) bilaterally for facet joint pain. Lumbar quadrant test (Kemp's test): (+)  bilaterally for facet joint pain. Lateral bending test: deferred today       Patrick's Maneuver: deferred today                   FABER* test: deferred today                   S-I anterior distraction/compression test: deferred today         S-I lateral compression test: deferred today         S-I Thigh-thrust test: deferred today         S-I Gaenslen's test: deferred today         *(Flexion, ABduction and External Rotation)  Gait & Posture Assessment  Ambulation: Patient ambulates using a cane Gait: Significantly limited. Dependent on assistive device to ambulate Posture: Difficulty standing up straight, due to pain   Lower Extremity Exam    Side: Right lower extremity  Side: Left lower extremity  Stability: No instability observed          Stability: No instability observed          Skin & Extremity Inspection: Skin color, temperature, and hair growth are WNL. No peripheral edema or cyanosis. No masses, redness, swelling, asymmetry, or associated skin lesions. No contractures.  Skin & Extremity Inspection: Skin color, temperature, and hair growth are WNL. No peripheral edema or cyanosis. No masses, redness, swelling, asymmetry, or associated skin lesions. No contractures.  Functional ROM: Pain restricted ROM for hip and knee joints          Functional ROM: Decreased ROM for all joints of the lower extremity          Muscle Tone/Strength: Functionally intact. No obvious neuro-muscular anomalies detected.  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  Diagnoses of Diabetic polyneuropathy associated with type 2 diabetes mellitus ([place]), Myalgia, and Chronic pain syndrome were also pertinent to this visit.  Visit Diagnosis (New problems to examiner): 1. Central pain syndrome   2. Diabetic polyneuropathy associated with type 2 diabetes mellitus ([place])   3. Myalgia   4. Chronic pain syndrome    General Recommendations: The pain condition that the patient suffers from is best treated with a multidisciplinary approach that involves an increase in physical activity to prevent de-conditioning and worsening of the pain cycle, as well as psychological counseling (formal and/or informal) to address the co-morbid psychological affects of pain. Treatment will often involve judicious use of pain medications and interventional procedures to decrease the pain, allowing the patient to participate in the physical activity that will ultimately produce long-lasting pain reductions. The goal of the multidisciplinary approach is to return the patient to a higher level of overall function and to restore their ability to perform activities of daily living.   Plan of Care (Initial workup plan)  Note: [name] was reminded that as per protocol, today's visit has been an evaluation only. We have not taken over the patient's controlled substance management.  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  Has residual left-sided sensory and motor deficits.  We will focus on membrane stabilizers and neuropathic agents.  Recommend patient increase Cymbalta from 20 to 40 mg daily.  Also recommend that she increase her gabapentin as below.  Would like to avoid chronic opioid therapy if possible.  No interventional options for centralized pain.   Lab Orders     UDS (Comprehensive-24) ([facility]) ([facility]) ([place] Pharmacotherapy (current): Medications ordered:  Meds ordered this encounter  Medications  . DULoxetine (CYMBALTA) 20 MG capsule     Sig: Take 2 capsules (40 mg total) by mouth daily.    Dispense:  60 capsule    Refill:  2  . gabapentin (NEURONTIN) 400 MG capsule    Sig: 400 mg qAM, 400 mg qPM, 800 mg qhs    Dispense:  120 capsule    Refill:  2   Medications administered during this visit: Lashonda V. Linquist "Daiya Tamer" had no medications administered during this visit.   Pharmacological management options:  Opioid Analgesics: The patient was informed that there is no guarantee that she would be a candidate for opioid analgesics. The decision will be made following CDC guidelines. This decision will be based on the results of diagnostic studies, as well as [name]'s risk profile.   Membrane stabilizer: Adequate regimen, consider Lyrica in future  Muscle relaxant: To be determined at a later time  NSAID: Medically contraindicated on Plavix, minimize bleeding risk  Other analgesic(s): To be determined at a later time  Provider-requested follow-up: Return in about 3 months (around 03/08/2019) for Medication Management, in person.  Future Appointments  Date Time Provider [place]  12/20/2018  2:00 PM Lucilla Lame, MD [facility] None  03/07/2019  9:45 AM Gillis Santa, MD [facility] None    Primary Care Physician: Hubbard Hartshorn, [name] Location: [facility] Note by: Gillis Santa, MD Date: 12/06/2018; Time: 4:16 PM  Note: This dictation was prepared with Dragon dictation. Any transcriptional errors that may result from this process are unintentional.

## 2018-12-06 NOTE — Patient Instructions (Signed)
Prescriptions for Gabapentin and Cymbalta were sent to your pharmacy.

## 2018-12-08 ENCOUNTER — Telehealth: Payer: Self-pay

## 2018-12-08 NOTE — Telephone Encounter (Signed)
Copied from [place] [phone]. Topic: Referral - Status >> Dec 08, 2018  2:57 PM Simone Curia D wrote: [id number] Spoke with patient about [facility] transportation benefit.  She stated she will call tomorrow and does not need assistance. Ambrose Mantle [phone]

## 2018-12-09 LAB — COMPLIANCE DRUG ANALYSIS, UR

## 2018-12-12 ENCOUNTER — Other Ambulatory Visit: Payer: Self-pay | Admitting: Unknown Physician Specialty

## 2018-12-12 ENCOUNTER — Other Ambulatory Visit (HOSPITAL_COMMUNITY): Payer: Self-pay | Admitting: Unknown Physician Specialty

## 2018-12-12 DIAGNOSIS — H9212 Otorrhea, left ear: Secondary | ICD-10-CM

## 2018-12-12 DIAGNOSIS — H70002 Acute mastoiditis without complications, left ear: Secondary | ICD-10-CM | POA: Diagnosis not present

## 2018-12-12 DIAGNOSIS — G4089 Other seizures: Secondary | ICD-10-CM | POA: Diagnosis not present

## 2018-12-12 DIAGNOSIS — H90A32 Mixed conductive and sensorineural hearing loss, unilateral, left ear with restricted hearing on the contralateral side: Secondary | ICD-10-CM | POA: Diagnosis not present

## 2018-12-19 ENCOUNTER — Encounter: Payer: Self-pay | Admitting: Family Medicine

## 2018-12-20 ENCOUNTER — Ambulatory Visit: Payer: Medicare HMO | Admitting: Gastroenterology

## 2018-12-23 ENCOUNTER — Encounter: Payer: Self-pay | Admitting: Family Medicine

## 2018-12-23 ENCOUNTER — Other Ambulatory Visit: Payer: Self-pay

## 2018-12-23 ENCOUNTER — Ambulatory Visit
Admission: RE | Admit: 2018-12-23 | Discharge: 2018-12-23 | Disposition: A | Discharge status: Home / Self Care | Payer: Medicare HMO | Source: Ambulatory Visit | Attending: Unknown Physician Specialty | Admitting: Unknown Physician Specialty

## 2018-12-23 DIAGNOSIS — H70002 Acute mastoiditis without complications, left ear: Secondary | ICD-10-CM | POA: Insufficient documentation

## 2018-12-23 DIAGNOSIS — R42 Dizziness and giddiness: Secondary | ICD-10-CM | POA: Diagnosis not present

## 2018-12-23 DIAGNOSIS — H748X2 Other specified disorders of left middle ear and mastoid: Secondary | ICD-10-CM | POA: Diagnosis not present

## 2019-01-03 DIAGNOSIS — J4550 Severe persistent asthma, uncomplicated: Secondary | ICD-10-CM | POA: Diagnosis not present

## 2019-01-04 DIAGNOSIS — J449 Chronic obstructive pulmonary disease, unspecified: Secondary | ICD-10-CM | POA: Diagnosis not present

## 2019-01-06 ENCOUNTER — Encounter: Payer: Self-pay | Admitting: Family Medicine

## 2019-01-10 DIAGNOSIS — H7012 Chronic mastoiditis, left ear: Secondary | ICD-10-CM | POA: Diagnosis not present

## 2019-01-12 ENCOUNTER — Encounter: Payer: Self-pay | Admitting: Family Medicine

## 2019-02-04 DIAGNOSIS — J449 Chronic obstructive pulmonary disease, unspecified: Secondary | ICD-10-CM | POA: Diagnosis not present

## 2019-02-16 ENCOUNTER — Ambulatory Visit: Payer: Medicare HMO

## 2019-02-21 ENCOUNTER — Ambulatory Visit: Payer: Medicare HMO | Admitting: Gastroenterology

## 2019-02-21 ENCOUNTER — Other Ambulatory Visit: Payer: Self-pay

## 2019-02-21 ENCOUNTER — Telehealth: Payer: Self-pay | Admitting: Family Medicine

## 2019-02-21 ENCOUNTER — Ambulatory Visit (INDEPENDENT_AMBULATORY_CARE_PROVIDER_SITE_OTHER): Payer: Medicare HMO

## 2019-02-21 VITALS — Ht 66.0 in | Wt 220.0 lb

## 2019-02-21 DIAGNOSIS — Z789 Other specified health status: Secondary | ICD-10-CM | POA: Diagnosis not present

## 2019-02-21 DIAGNOSIS — Z79899 Other long term (current) drug therapy: Secondary | ICD-10-CM | POA: Diagnosis not present

## 2019-02-21 DIAGNOSIS — Z748 Other problems related to care provider dependency: Secondary | ICD-10-CM

## 2019-02-21 DIAGNOSIS — G4733 Obstructive sleep apnea (adult) (pediatric): Secondary | ICD-10-CM | POA: Diagnosis not present

## 2019-02-21 DIAGNOSIS — J449 Chronic obstructive pulmonary disease, unspecified: Secondary | ICD-10-CM | POA: Diagnosis not present

## 2019-02-21 DIAGNOSIS — Z598 Other problems related to housing and economic circumstances: Secondary | ICD-10-CM | POA: Diagnosis not present

## 2019-02-21 DIAGNOSIS — Z0000 Encounter for general adult medical examination without abnormal findings: Secondary | ICD-10-CM

## 2019-02-21 DIAGNOSIS — Z5989 Other problems related to housing and economic circumstances: Secondary | ICD-10-CM

## 2019-02-21 NOTE — Progress Notes (Signed)
Subjective:   [name] is a 54 y.o. female who presents for Medicare Annual (Subsequent) preventive examination.  Virtual Visit via Telephone Note  I connected with [name] on 02/21/19 at 10:40 AM EST by telephone and verified that I am speaking with the correct person using two identifiers.  Medicare Annual Wellness visit completed telephonically due to Covid-19 pandemic.   Location: Patient: home Provider: office   I discussed the limitations, risks, security and privacy concerns of performing an evaluation and management service by telephone and the availability of in person appointments. The patient expressed understanding and agreed to proceed.  Some vital signs may be absent or patient reported.   [name], LPN    Review of Systems:   Cardiac Risk Factors include: diabetes mellitus;dyslipidemia;obesity (BMI >30kg/m2);hypertension     Objective:     Vitals: Ht _0  (1.676 m)   Wt 220 lb (99.8 kg)   LMP  (LMP Unknown)   BMI 35.51 kg/m   Body mass index is 35.51 kg/m.  Advanced Directives 02/21/2019 12/05/2018 11/07/2018 03/03/2018 02/04/2018 12/03/2017 11/11/2016  Does Patient Have a Medical Advance Directive? Yes No No Yes Yes Yes No  Type of Paramedic of [place];Living will - - [place];Living will Healthcare Power of [place] -  Does patient want to make changes to medical advance directive? - - - No - Patient declined - No - Patient declined -  Copy of [place] in Chart? Yes - validated most recent copy scanned in chart (See row information) - - Yes - validated most recent copy scanned in chart (See row information) No - copy requested No - copy requested -  Would patient like information on creating a medical advance directive? - No - Patient declined - - - No - Patient declined -  Pre-existing out of facility DNR order (yellow form or pink  MOST form) - - - - - - -    Tobacco Social History   Tobacco Use  Smoking Status Former Smoker  . Packs/day: 1.00  . Years: 15.00  . Pack years: 15.00  . Types: Cigarettes  . Quit date: 01/20/1987  . Years since quitting: 32.1  Smokeless Tobacco Never Used  Tobacco Comment   quit more than 20 years     Counseling given: Not Answered Comment: quit more than 20 years   Clinical Intake:  Pre-visit preparation completed: Yes  Pain : 0-10 Pain Score: 10-Worst pain ever Pain Type: Chronic pain Pain Location: Arm(shoulder and upper arm) Pain Orientation: Left Pain Descriptors / Indicators: Nagging, Throbbing, Stabbing Pain Onset: 1 to 4 weeks ago Pain Frequency: Constant Pain Relieving Factors: gabapentin, topical cream  Pain Relieving Factors: gabapentin, topical cream  BMI - recorded: 35.51 Nutritional Status: BMI > 30  Obese Nutritional Risks: None Diabetes: Yes CBG done?: No Did pt. bring in CBG monitor from home?: No  How often do you need to have someone help you when you read instructions, pamphlets, or other written materials from your doctor or pharmacy?: 1 - Never  Interpreter Needed?: No  Information entered by :: [name] LPN  Past Medical History:  Diagnosis Date  . Anxiety and depression 09/13/2006   Qualifier: Diagnosis of  By: Hassell Done [name], Tori Milks    . Arthritis    joint pain   . Asthma   . COPD (chronic obstructive pulmonary disease) (HCC)    chronic bronchitis   . Depression   .  Diabetes mellitus    since age 89; type 2 IDDM  . Diabetic neuropathy, painful ([place])    FEET AND HANDS  . Diabetic retinopathy ([place])   . Diastolic dysfunction    a.  Echo 11/17: EF 60-65%, mild LVH, no RWMA, Gr1DD, mild AI, dilated aortic root measuring 38 mm, mildly dilated ascending aorta  . Dilated aortic root ([place])    68m by echo 11/2015  . Dizziness    secondary to diabetes and hypertension   . Epilepsy idiopathic petit mal ([place]    last seizure  2012;controlled w/ topomax  . GERD (gastroesophageal reflux disease)   . Glaucoma    NOT ON ANY EYE DROPS   . Headache(784.0)    migraines   . Heart murmur    born with   . History of stress test    a. 11/17: Normal perfusion, EF 53%, normal study  . Hx of blood clots    hematomas removed from left side of brain from [id number] [id number]   . Hyperlipidemia   . Hypertension   . Legally blind    left eye   . MIGRAINE HEADACHE 09/14/2006   Qualifier: Diagnosis of  By: MarHassell DoneP, NykTori Milks . Myocardial infarction ([place]  a.  Patient reported history of without objective documentation  . Nephrolithiasis    frequent urination , urination at nite  PT SEEN IN ER 09/22/11 FOR BACK AND RT SIDED PAIN--HAS KNOWN STONE RT URETER AND UA IN ER SHOWED UTI  . Pain 09/23/11   LOWER BACK AND RIGHT SIDE--PT HAS RIGHT URETERAL STONE  . Pneumonia    hx of 2009  . Pyelonephritis   . Seizures ([facility] . Sleep apnea    [facility] ; mild  . Stress incontinence   . [facility]  [facility]  RESIDUAL LEFT LEG WEAKNESS--NO OTHER RESIDUAL PROBLEMS   Past Surgical History:  Procedure Laterality Date  . BRAIN HEMATOMA EVACUATION     five procedures total, first procedure when 10 52 monthsd, last at 4 y22ars of age.  . CYSTOSCOPY W/ RETROGRADES  09/24/2011   Procedure: CYSTOSCOPY WITH RETROGRADE PYELOGRAM;  Surgeon: JohMalka SoD;  Location: [facility];  Service: Urology;  Laterality: Right;  . CYSTOSCOPY WITH URETEROSCOPY  09/24/2011   Procedure: CYSTOSCOPY WITH URETEROSCOPY;  Surgeon: JohMalka SoD;  Location: [facility];  Service: Urology;  Laterality: Right;  Balloon dilation right ureter   . CYSTOSCOPY/RETROGRADE/URETEROSCOPY  08/24/2011   Procedure: CYSTOSCOPY/RETROGRADE/URETEROSCOPY;  Surgeon: DanMolli HazardD;  Location: [facility];  Service: Urology;  Laterality: Right;  Cysto, Right retrograde Pyelogram, right stent placement.   . IFatima BlankRNIA REPAIR   05/05/2011   Procedure: LAPAROSCOPIC INCISIONAL HERNIA;  Surgeon: MatRolm BookbinderD;  [facility];  Laterality: N/A;  . kidney stone removal    . MASS EXCISION  05/05/2011   Procedure: EXCISION MASS;  Surgeon: MatRolm BookbinderD;  [facility] [place];  Laterality: Right;  . PCNL    . RETINAL DETACHMENT SURGERY Left 1990  . SHUNT REMOVAL     shunt inserted at age 27 r80moved at age 56 85. VAGINAL HYSTERECTOMY  1996   Family History  Problem Relation Age of Onset  . Uterine cancer Mother   . Hypertension Mother   . Cancer Mother   . Brain cancer Maternal Grandmother   . Hypertension  Maternal Grandmother   . Birth defects Daughter   . Hypertension Daughter   . Cirrhosis Maternal Grandfather   . ADD / ADHD Maternal Grandfather   . Birth defects Maternal Grandfather   . Diabetes Maternal Grandfather   . Anesthesia problems Neg Hx   . Colon cancer Neg Hx   . Esophageal cancer Neg Hx   . Pancreatic cancer Neg Hx    Social History   Socioeconomic History  . Marital status: Widowed    Spouse name: Not on file  . Number of children: 2  . Years of education: Not on file  . Highest education level: Not on file  Occupational History  . Occupation: disabled  Tobacco Use  . Smoking status: Former Smoker    Packs/day: 1.00    Years: 15.00    Pack years: 15.00    Types: Cigarettes    Quit date: 01/20/1987    Years since quitting: 32.1  . Smokeless tobacco: Never Used  . Tobacco comment: quit more than 20 years  Substance and Sexual Activity  . Alcohol use: Never    Alcohol/week: 0.0 standard drinks  . Drug use: No    Comment: hx of marijuana use no longer uses   . Sexual activity: Yes    Partners: Male    Birth control/protection: Surgical  Other Topics Concern  . Not on file  Social History Narrative  . Not on file   Social Determinants of Health   Financial Resource Strain: Low Risk   . Difficulty of Paying Living Expenses: Not  very hard  Food Insecurity: No Food Insecurity  . Worried About Charity fundraiser in the Last Year: Never true  . Ran Out of Food in the Last Year: Never true  Transportation Needs: No Transportation Needs  . Lack of Transportation (Medical): No  . Lack of Transportation (Non-Medical): No  Physical Activity: Insufficiently Active  . Days of Exercise per Week: 2 days  . Minutes of Exercise per Session: 20 min  Stress: No Stress Concern Present  . Feeling of Stress : Only a little  Social Connections: Slightly Isolated  . Frequency of Communication with Friends and Family: More than three times a week  . Frequency of Social Gatherings with Friends and Family: More than three times a week  . Attends Religious Services: More than 4 times per year  . Active Member of Clubs or Organizations: Yes  . Attends Archivist Meetings: More than 4 times per year  . Marital Status: Widowed    Outpatient Encounter Medications as of 02/21/2019  Medication Sig  . albuterol (ACCUNEB) 1.25 MG/3ML nebulizer solution Inhale into the lungs. Use 3 mls by nebulization every 6 hours as needed for wheezing  . albuterol (PROAIR HFA) 108 (90 Base) MCG/ACT inhaler INHALE 2 PUFFS BY MOUTH EVERY 6 HOURS AS NEEDED FOR WHEEZING OR SHORTNESS OF BREATH  . AMBULATORY NON FORMULARY MEDICATION Medication Name: Muscle and Joint Vanishing Scent Gel- Apply as needed to skin  . amLODipine (NORVASC) 5 MG tablet Take 1 tablet (5 mg total) by mouth daily.  . baclofen (LIORESAL) 10 MG tablet Take 1 tablet (10 mg total) by mouth 3 (three) times daily.  . Blood Glucose Monitoring Suppl (ACCU-CHEK AVIVA PLUS) w/Device KIT 1 each by XX route as directed for up to 1 day  . budesonide (PULMICORT) 0.5 MG/2ML nebulizer solution Inhale into the lungs. Take 2 mls by nebulizations once daily  . Calcium Carbonate-Vitamin D (CALCIUM-VITAMIN D3 PO)  D3 500- calcium 630 mg Take 1 capsule by mouth once daily  . carvedilol (COREG) 3.125 MG  tablet Take 1 tablet by mouth 2 (two) times daily.  . clopidogrel (PLAVIX) 75 MG tablet Take 1 tablet (75 mg total) by mouth daily.  . Dulaglutide 1.5 MG/0.5ML SOPN Inject 1.5 mg into the skin once a week.  . DULoxetine (CYMBALTA) 20 MG capsule Take 2 capsules (40 mg total) by mouth daily.  [name] ezetimibe (ZETIA) 10 MG tablet Take 1 tablet (10 mg total) by mouth daily.  . fluticasone (FLONASE) 50 MCG/ACT nasal spray Place 2 sprays into both nostrils daily.  . Fluticasone-Salmeterol (ADVAIR DISKUS) 250-50 MCG/DOSE AEPB Inhale 1 puff into the lungs 2 (two) times daily.  [name] gabapentin (NEURONTIN) 400 MG capsule 400 mg qAM, 400 mg qPM, 800 mg qhs  . glucose blood (ACCU-CHEK AVIVA PLUS) test strip Use 1 each (1 strip total) 4 (four) times daily for 90 days Use as instructed.  . insulin aspart (NOVOLOG FLEXPEN) 100 UNIT/ML FlexPen Inject into the skin.  . Insulin Glargine, 2 Unit Dial, 300 UNIT/ML SOPN Inject 120 Units into the skin daily. (Patient taking differently: Inject 136 Units into the skin daily. )  . levETIRAcetam (KEPPRA) 500 MG tablet Take 500 mg by mouth 2 (two) times daily.  [name] losartan (COZAAR) 100 MG tablet Take 1 tablet (100 mg total) by mouth daily.  . mirabegron ER (MYRBETRIQ) 50 MG TB24 tablet Take 1 tablet (50 mg total) by mouth daily.  . ondansetron (ZOFRAN ODT) 4 MG disintegrating tablet Take 1 tablet (4 mg total) by mouth every 8 (eight) hours as needed for nausea or vomiting.  Glory Rosebush DELICA LANCETS 54M MISC Use to check sugar three times daily as directed.Dx: E11.29; E11.40  . oxyCODONE-acetaminophen (PERCOCET) 5-325 MG tablet Take 1 tablet by mouth every 4 (four) hours as needed for severe pain.  . pantoprazole (PROTONIX) 40 MG tablet Take 1 tablet (40 mg total) by mouth daily.  . polyethylene glycol (MIRALAX / GLYCOLAX) packet Take 17 g by mouth daily as needed for mild constipation.  . potassium chloride SA (KLOR-CON M20) 20 MEQ tablet Take 2 tablets (40 mEq total) by mouth  daily.  . predniSONE (DELTASONE) 20 MG tablet Take 1 tablet by mouth daily.  . prochlorperazine (COMPAZINE) 10 MG tablet Take 1 tablet (10 mg total) by mouth every 6 (six) hours as needed for nausea or vomiting.  . simvastatin (ZOCOR) 20 MG tablet Take 1 tablet (20 mg total) by mouth at bedtime.  [name] spironolactone (ALDACTONE) 25 MG tablet Take 1 tablet by mouth daily.  . cycloSPORINE (RESTASIS) 0.05 % ophthalmic emulsion Place 2 drops into both eyes 2 (two) times daily.  . fluorometholone (FML) 0.1 % ophthalmic suspension Place 2 drops into both eyes 2 (two) times daily.  . Insulin Pen Needle (EASY TOUCH PEN NEEDLES) 31G X 8 MM MISC Use as directed in giving insulin. Dx: E11.29  . sucralfate (CARAFATE) 1 g tablet Take 1 tablet (1 g total) by mouth 4 (four) times daily for 10 days.  . [DISCONTINUED] Blood Glucose Monitoring Suppl (FIFTY50 GLUCOSE METER 2.0) w/Device KIT Accuchek  . [DISCONTINUED] Calcium Carb-Cholecalciferol (OYSTER SHELL CALCIUM/VITAMIN D) 500-200 MG-UNIT PACK D3 500- calcium 630 mg Take 1 capsule by mouth once daily  . [DISCONTINUED] cimetidine (TAGAMET) 300 MG tablet Take 1 tablet (300 mg total) by mouth 2 (two) times daily.  . [DISCONTINUED] glucose blood (PRECISION QID TEST) test strip Accuchek brand  . [DISCONTINUED] terconazole (  TERAZOL 7) 0.4 % vaginal cream Place 1 applicator vaginally at bedtime.   No facility-administered encounter medications on file as of 02/21/2019.    Activities of Daily Living In your present state of health, do you have any difficulty performing the following activities: 02/21/2019 10/12/2018  Hearing? N N  Comment declines hearing aids -  Vision? Y Y  Difficulty concentrating or making decisions? N N  Walking or climbing stairs? N N  Dressing or bathing? N N  Doing errands, shopping? N N  Preparing Food and eating ? N -  Using the Toilet? N -  In the past six months, have you accidently leaked urine? Y -  Comment wears depends for protection  -  Do you have problems with loss of bowel control? N -  Managing your Medications? N -  Managing your Finances? N -  Housekeeping or managing your Housekeeping? N -  Some recent data might be hidden    Patient Care Team: Hubbard Hartshorn, [name] as PCP - General (Family Medicine) [place] Comment, My Lemay, [place] as Referring Physician (Optometry) Cathi Roan, [facility] (Pharmacist)    Assessment:   This is a routine wellness examination for [place].  Exercise Activities and Dietary recommendations Current Exercise Habits: Home exercise routine, Type of exercise: walking, Time (Minutes): 20, Frequency (Times/Week): 2, Weekly Exercise (Minutes/Week): 40, Intensity: Mild, Exercise limited by: neurologic condition(s)  Goals    . "I would like my own place" completed (pt-stated)     Current Barriers:  . Financial constraints . Housing barriers  Clinical Social Work Clinical Goal(s):  [name] Over the next 90 days, client will work with SW to address concerns related to available affordable housing options  Interventions: . Patient interviewed and appropriate assessments performed . Confirmed with patient that information about affordable housing resources mailed to her home were received . Confirmed that patient will be remaining with family at this time and has postponed plans to move . Advised patient to save housing resources provided for use in the future if needed . Advised patient to call this social worker if assistance is needed in the future  Patient Self Care Activities:  . Performs ADL's independently . Performs IADL's independently . Calls provider office for new concerns or questions  Please see past updates related to this goal by clicking on the "Past Updates" button in the selected goal      . My blood sugars have been in the 300s and 400s recently (pt-stated)     Current Barriers:  [name] Knowledge Deficits related to basic Diabetes pathophysiology and self care/management  Nurse Case  Manager Clinical Goal(s):  [name] Over the next 14 days, patient will demonstrate improved adherence to prescribed treatment plan for diabetes self care/management as evidenced by checking blood glucose levels as prescribed, adhering to ADA/low carb diet, daily exercise, and medication adherence-goal completed 09/12/2018 . Over the next 60 days, patient will continue to demonstrate improved adherence to prescribed treatment plan for diabetes self care/management as evidenced by checking blood glucose levels as prescribed, adhering to ADA/low carb diet, daily exercise, and medication adherence  Interventions:  . Reviewed blood sugar log . Discussed importance of carb counting with patient and daughter . Strongly encouraged patient to exercise/walk daily and discussed how this can lower blood sugars . Assessed for receipt of previously provided DM education via mail . Reviewed scheduled/upcoming provider appointments including: 08/12/2018 PCP, 08/15/2018 urology . Advised patient, providing education and rationale, to check cbg per [facility] recommendations  and call endocrinology office if blood sugars do not respond to hyperglycemia treatment  Patient Self Care Activities:  . Self administers medications as prescribed . Attends all scheduled provider appointments . Checks blood sugars as prescribed and utilize hyper and hypoglycemia protocol as needed . Adhere to ADA diet as discussed   Please see past updates related to this goal by clicking on the "Past Updates" button in the selected goal       . Patient Stated    . Weight (lb) < 220 lb (99.8 kg)     Starting 05/07/2016 I will start going to a diabetes counselor to help lose extra weight.       Fall Risk Fall Risk  02/21/2019 12/06/2018 11/28/2018 11/14/2018 10/12/2018  Falls in the past year? _0 Number falls in past yr: _1 Injury with Fall? 0 0 0 0 1  Risk for fall due to : History of fall(s);Impaired  balance/gait;Impaired vision - - History of fall(s) -  Risk for fall due to: Comment - - - - -  Follow up Falls evaluation completed - Falls evaluation completed - -   FALL RISK PREVENTION PERTAINING TO THE HOME:  Any stairs in or around the home? Yes  If so, do they handrails? Yes   Home free of loose throw rugs in walkways, pet beds, electrical cords, etc? Yes  Adequate lighting in your home to reduce risk of falls? Yes   ASSISTIVE DEVICES UTILIZED TO PREVENT FALLS:  Life alert? No  Use of a cane, walker or w/c? Yes  Grab bars in the bathroom? Yes  Shower chair or bench in shower? No  Elevated toilet seat or a handicapped toilet? No   DME ORDERS:  DME order needed?  Yes   - pt would feel safer with shower chair and elevated toilet seat. Advised pt to contact insurance for DME supplier and scheduled office visit to discuss with provider for rx.   TIMED UP AND GO:  Was the test performed? No . Telephonic visit.   Education: Fall risk prevention has been discussed.  Intervention(s) required? No   Depression Screen PHQ 2/9 Scores 02/21/2019 12/06/2018 11/28/2018 11/14/2018  PHQ - 2 Score 0 0 0 0  PHQ- 9 Score - - 0 0     Cognitive Function        Immunization History  Administered Date(s) Administered  . Influenza Split 10/07/2012  . Influenza Whole 11/20/2006  . Influenza,inj,Quad PF,6+ Mos 10/30/2013, 10/19/2014, 01/24/2018, 10/12/2018  . Pneumococcal Conjugate-13 04/29/2015  . Pneumococcal Polysaccharide-23 11/20/2006, 09/25/2011  . Tdap 06/23/2018    Qualifies for Shingles Vaccine? Yes . Due for Shingrix. Education has been provided regarding the importance of this vaccine. Pt has been advised to call insurance company to determine out of pocket expense. Advised may also receive vaccine at local pharmacy or Health Dept. Verbalized acceptance and understanding.  Tdap: Up to date  Flu Vaccine: Up to date  Pneumococcal Vaccine: Up to date   Screening  Tests Health Maintenance  Topic Date Due  . MAMMOGRAM  08/12/2017  . HEMOGLOBIN A1C  03/22/2019  . OPHTHALMOLOGY EXAM  07/19/2019  . FOOT EXAM  09/22/2019  . PAP-Cervical Cytology Screening  06/23/2021  . PAP SMEAR-Modifier  06/23/2021  . COLONOSCOPY  01/20/2027  . TETANUS/TDAP  06/22/2028  . INFLUENZA VACCINE  Completed  . PNEUMOCOCCAL POLYSACCHARIDE VACCINE AGE 65-64 HIGH RISK  Completed  . HIV Screening  Completed  Cancer Screenings:  Colorectal Screening: Due - pt states GI recommended Cologuard due to sleep apnea and has ordered patient cologuard.   Mammogram: Completed 08/13/15. Repeat every year.  Ordered 06/23/18. Pt provided with contact information and advised to call to schedule appt.   Bone Density: due at age 47  Lung Cancer Screening: (Low Dose CT Chest recommended if Age 72-80 years, 30 pack-year currently smoking OR have quit w/in 15years.) does not qualify.   Additional Screening:  Hepatitis C Screening: does qualify; due  Vision Screening: Recommended annual ophthalmology exams for early detection of glaucoma and other disorders of the eye. Is the patient up to date with their annual eye exam?  Yes  Who is the provider or what is the name of the office in which the pt attends annual eye exams? [place] Screening: Recommended annual dental exams for proper oral hygiene  Community Resource Referral:  CRR required this visit?  Yes     Plan:     I have personally reviewed and addressed the Medicare Annual Wellness questionnaire and have noted the following in the patient's chart:  A. Medical and social history B. Use of alcohol, tobacco or illicit drugs  C. Current medications and supplements D. Functional ability and status E.  Nutritional status F.  Physical activity G. Advance directives H. List of other physicians I.  Hospitalizations, surgeries, and ER visits in previous 12 months J.  [place] such as hearing and  vision if needed, cognitive and depression L. Referrals and appointments   In addition, I have reviewed and discussed with patient certain preventive protocols, quality metrics, and best practice recommendations. A written personalized care plan for preventive services as well as general preventive health recommendations were provided to patient.   Signed,  [name], LPN Nurse Health Advisor   Nurse Notes: pt c/o neuropathic type pain that started in left arm/should approx 1 week ago relieved by gabapentin and muscle rub. Pt denied chest pain, SOB or headaches/dizziness. She attributes this to neuralgia and did not request urgent appointment.   Pt also states she needs medications sent to [facility] [address] but did not know which ones at this time. She no longer wants to use [facility] pharmacy but would like an rx for a shower chair and elevated toilet seat to help prevent falls and she plans to contact [facility] for DME supplier or where to send rx. Advised patient to follow up with provider for rx for those items and scheduled with Delsa Grana PAC next week. Pt also due for PCP follow up. Referred to CCM team for RN and pharmacy to assist patient with continuity of care among multiple providers and medications.

## 2019-02-21 NOTE — Chronic Care Management (AMB) (Signed)
  Chronic Care Management   Note  02/21/2019 Name: [name] MRN: [id number] DOB: 06-Aug-1965  [name] is a 54 y.o. year old female who is a primary care patient of Hubbard Hartshorn, [name]. Clevie Prout is currently enrolled in care management services. An additional referral for RN case manager was placed.   Follow up plan: Telephone appointment with care management team member scheduled for:02/27/2019  Glenna Durand, [place] Management ??[email or web address] ??[phone]

## 2019-02-21 NOTE — Patient Instructions (Signed)
[name] , Thank you for taking time to come for your Medicare Wellness Visit. I appreciate your ongoing commitment to your health goals. Please review the following plan we discussed and let me know if I can assist you in the future.   Screening recommendations/referrals: Colonoscopy: due - please complete Cologuard test once you receive it.  Mammogram: done 08/11/15. Please call [phone] to schedule your mammogram.  Bone Density: due age 54 Recommended yearly ophthalmology/optometry visit for glaucoma screening and checkup Recommended yearly dental visit for hygiene and checkup  Vaccinations: Influenza vaccine: done 10/12/18 Pneumococcal vaccine: done 04/1015 Tdap vaccine: done 06/23/18 Shingles vaccine: Shingrix discussed. Please contact your pharmacy for coverage information.   Conditions/risks identified: Recommend increasing physical activity to 150 minutes per week   Next appointment: Please follow up in one year for your Medicare Annual Wellness visit.    Preventive Care 40-64 Years, Female Preventive care refers to lifestyle choices and visits with your health care provider that can promote health and wellness. What does preventive care include?  A yearly physical exam. This is also called an annual well check.  Dental exams once or twice a year.  Routine eye exams. Ask your health care provider how often you should have your eyes checked.  Personal lifestyle choices, including:  Daily care of your teeth and gums.  Regular physical activity.  Eating a healthy diet.  Avoiding tobacco and drug use.  Limiting alcohol use.  Practicing safe sex.  Taking low-dose aspirin daily starting at age 32.  Taking vitamin and mineral supplements as recommended by your health care provider. What happens during an annual well check? The services and screenings done by your health care provider during your annual well check will depend on your age, overall health, lifestyle  risk factors, and family history of disease. Counseling  Your health care provider may ask you questions about your:  Alcohol use.  Tobacco use.  Drug use.  Emotional well-being.  Home and relationship well-being.  Sexual activity.  Eating habits.  Work and work Statistician.  Method of birth control.  Menstrual cycle.  Pregnancy history. Screening  You may have the following tests or measurements:  Height, weight, and BMI.  Blood pressure.  Lipid and cholesterol levels. These may be checked every 5 years, or more frequently if you are over 51 years old.  Skin check.  Lung cancer screening. You may have this screening every year starting at age 5 if you have a 30-pack-year history of smoking and currently smoke or have quit within the past 15 years.  Fecal occult blood test (FOBT) of the stool. You may have this test every year starting at age 23.  Flexible sigmoidoscopy or colonoscopy. You may have a sigmoidoscopy every 5 years or a colonoscopy every 10 years starting at age 58.  Hepatitis C blood test.  Hepatitis B blood test.  Sexually transmitted disease (STD) testing.  Diabetes screening. This is done by checking your blood sugar (glucose) after you have not eaten for a while (fasting). You may have this done every 1-3 years.  Mammogram. This may be done every 1-2 years. Talk to your health care provider about when you should start having regular mammograms. This may depend on whether you have a family history of breast cancer.  BRCA-related cancer screening. This may be done if you have a family history of breast, ovarian, tubal, or peritoneal cancers.  Pelvic exam and Pap test. This may be done every 3 years starting at  age 52. Starting at age 70, this may be done every 5 years if you have a Pap test in combination with an HPV test.  Bone density scan. This is done to screen for osteoporosis. You may have this scan if you are at high risk for  osteoporosis. Discuss your test results, treatment options, and if necessary, the need for more tests with your health care provider. Vaccines  Your health care provider may recommend certain vaccines, such as:  Influenza vaccine. This is recommended every year.  Tetanus, diphtheria, and acellular pertussis (Tdap, Td) vaccine. You may need a Td booster every 10 years.  Zoster vaccine. You may need this after age 55.  Pneumococcal 13-valent conjugate (PCV13) vaccine. You may need this if you have certain conditions and were not previously vaccinated.  Pneumococcal polysaccharide (PPSV23) vaccine. You may need one or two doses if you smoke cigarettes or if you have certain conditions. Talk to your health care provider about which screenings and vaccines you need and how often you need them. This information is not intended to replace advice given to you by your health care provider. Make sure you discuss any questions you have with your health care provider. Document Released: 02/01/2015 Document Revised: 09/25/2015 Document Reviewed: 11/06/2014 Elsevier Interactive Patient Education  2017 [place] Prevention in the Home Falls can cause injuries. They can happen to people of all ages. There are many things you can do to make your home safe and to help prevent falls. What can I do on the outside of my home?  Regularly fix the edges of walkways and driveways and fix any cracks.  Remove anything that might make you trip as you walk through a door, such as a raised step or threshold.  Trim any bushes or trees on the path to your home.  Use bright outdoor lighting.  Clear any walking paths of anything that might make someone trip, such as rocks or tools.  Regularly check to see if handrails are loose or broken. Make sure that both sides of any steps have handrails.  Any raised decks and porches should have guardrails on the edges.  Have any leaves, snow, or ice cleared  regularly.  Use sand or salt on walking paths during winter.  Clean up any spills in your garage right away. This includes oil or grease spills. What can I do in the bathroom?  Use night lights.  Install grab bars by the toilet and in the tub and shower. Do not use towel bars as grab bars.  Use non-skid mats or decals in the tub or shower.  If you need to sit down in the shower, use a plastic, non-slip stool.  Keep the floor dry. Clean up any water that spills on the floor as soon as it happens.  Remove soap buildup in the tub or shower regularly.  Attach bath mats securely with double-sided non-slip rug tape.  Do not have throw rugs and other things on the floor that can make you trip. What can I do in the bedroom?  Use night lights.  Make sure that you have a light by your bed that is easy to reach.  Do not use any sheets or blankets that are too big for your bed. They should not hang down onto the floor.  Have a firm chair that has side arms. You can use this for support while you get dressed.  Do not have throw rugs and other things  on the floor that can make you trip. What can I do in the kitchen?  Clean up any spills right away.  Avoid walking on wet floors.  Keep items that you use a lot in easy-to-reach places.  If you need to reach something above you, use a strong step stool that has a grab bar.  Keep electrical cords out of the way.  Do not use floor polish or wax that makes floors slippery. If you must use wax, use non-skid floor wax.  Do not have throw rugs and other things on the floor that can make you trip. What can I do with my stairs?  Do not leave any items on the stairs.  Make sure that there are handrails on both sides of the stairs and use them. Fix handrails that are broken or loose. Make sure that handrails are as long as the stairways.  Check any carpeting to make sure that it is firmly attached to the stairs. Fix any carpet that is loose  or worn.  Avoid having throw rugs at the top or bottom of the stairs. If you do have throw rugs, attach them to the floor with carpet tape.  Make sure that you have a light switch at the top of the stairs and the bottom of the stairs. If you do not have them, ask someone to add them for you. What else can I do to help prevent falls?  Wear shoes that:  Do not have high heels.  Have rubber bottoms.  Are comfortable and fit you well.  Are closed at the toe. Do not wear sandals.  If you use a stepladder:  Make sure that it is fully opened. Do not climb a closed stepladder.  Make sure that both sides of the stepladder are locked into place.  Ask someone to hold it for you, if possible.  Clearly mark and make sure that you can see:  Any grab bars or handrails.  First and last steps.  Where the edge of each step is.  Use tools that help you move around (mobility aids) if they are needed. These include:  Canes.  Walkers.  Scooters.  Crutches.  Turn on the lights when you go into a dark area. Replace any light bulbs as soon as they burn out.  Set up your furniture so you have a clear path. Avoid moving your furniture around.  If any of your floors are uneven, fix them.  If there are any pets around you, be aware of where they are.  Review your medicines with your doctor. Some medicines can make you feel dizzy. This can increase your chance of falling. Ask your doctor what other things that you can do to help prevent falls. This information is not intended to replace advice given to you by your health care provider. Make sure you discuss any questions you have with your health care provider. Document Released: 11/01/2008 Document Revised: 06/13/2015 Document Reviewed: 02/09/2014 Elsevier Interactive Patient Education  2017 [employer].

## 2019-02-22 ENCOUNTER — Telehealth: Payer: Self-pay | Admitting: Family Medicine

## 2019-02-22 DIAGNOSIS — Z8673 Personal history of transient ischemic attack (TIA), and cerebral infarction without residual deficits: Secondary | ICD-10-CM

## 2019-02-22 DIAGNOSIS — M2550 Pain in unspecified joint: Secondary | ICD-10-CM

## 2019-02-22 DIAGNOSIS — G40909 Epilepsy, unspecified, not intractable, without status epilepticus: Secondary | ICD-10-CM

## 2019-02-22 DIAGNOSIS — E1142 Type 2 diabetes mellitus with diabetic polyneuropathy: Secondary | ICD-10-CM

## 2019-02-22 DIAGNOSIS — R296 Repeated falls: Secondary | ICD-10-CM

## 2019-02-22 DIAGNOSIS — M533 Sacrococcygeal disorders, not elsewhere classified: Secondary | ICD-10-CM

## 2019-02-22 DIAGNOSIS — G894 Chronic pain syndrome: Secondary | ICD-10-CM

## 2019-02-22 NOTE — Telephone Encounter (Signed)
Patient requested shower chair and raised toilet seat at visit for AWV with [name] LPN yesterday. Given her multiple medical problems including history CVA, chronic pain with arthralgias, sacroiliac dysfunction, seizure disorder, and recurrent falls, these safety measures are appropriate.  I have placed orders for both - please reach out to patient as she was working on contacting her insurance to find out which DME supplier she is to use.

## 2019-02-23 ENCOUNTER — Telehealth: Payer: Self-pay

## 2019-02-23 NOTE — Telephone Encounter (Signed)
Patient was offered earlier appointment with [name] for 2/4 and refused. Appointment for 2/10 with Kristeen Miss

## 2019-02-23 NOTE — Telephone Encounter (Signed)
Information printed and given to provider today to fill out for patient and give at visit next week. Thank you!

## 2019-02-23 NOTE — Telephone Encounter (Signed)
Copied from [place] [id number]. Topic: Referral - Status >> Feb 23, 2019  9:50 PM Simone Curia D wrote: 06/23/6286 Spoke with patient about housing information, she requested that I send the information via [place]. Patient has an appointment on  03/01/2019 and can sign her paperwork for [employer] then.  Ambrose Mantle [phone]

## 2019-02-24 ENCOUNTER — Ambulatory Visit: Payer: Medicare HMO | Admitting: Gastroenterology

## 2019-02-27 ENCOUNTER — Telehealth: Payer: Medicare HMO

## 2019-02-27 ENCOUNTER — Ambulatory Visit: Payer: Self-pay

## 2019-02-27 NOTE — Chronic Care Management (AMB) (Signed)
  Chronic Care Management   Outreach Note  02/27/2019 Name: [name] MRN: [id number] DOB: Mar 06, 1965  Referred by: Hubbard Hartshorn, [name] Reason for referral : Chronic Care Management   An unsuccessful telephone outreach was attempted today. [name] was referred to the case management team for assistance with care management and care coordination.   A HIPAA compliant voice message was left today requesting a return call.   PLAN The care management team will follow up with [name] again within the next two weeks.    [facility] [phone]

## 2019-02-28 ENCOUNTER — Telehealth: Payer: Medicare HMO

## 2019-03-01 ENCOUNTER — Encounter: Payer: Self-pay | Admitting: Internal Medicine

## 2019-03-01 ENCOUNTER — Telehealth: Payer: Self-pay

## 2019-03-01 ENCOUNTER — Other Ambulatory Visit: Payer: Self-pay

## 2019-03-01 ENCOUNTER — Ambulatory Visit (INDEPENDENT_AMBULATORY_CARE_PROVIDER_SITE_OTHER): Payer: Medicare HMO | Admitting: Internal Medicine

## 2019-03-01 VITALS — Ht 66.0 in | Wt 220.0 lb

## 2019-03-01 DIAGNOSIS — E1129 Type 2 diabetes mellitus with other diabetic kidney complication: Secondary | ICD-10-CM

## 2019-03-01 DIAGNOSIS — R112 Nausea with vomiting, unspecified: Secondary | ICD-10-CM | POA: Diagnosis not present

## 2019-03-01 DIAGNOSIS — N3281 Overactive bladder: Secondary | ICD-10-CM | POA: Diagnosis not present

## 2019-03-01 DIAGNOSIS — IMO0002 Reserved for concepts with insufficient information to code with codable children: Secondary | ICD-10-CM

## 2019-03-01 DIAGNOSIS — Z5181 Encounter for therapeutic drug level monitoring: Secondary | ICD-10-CM

## 2019-03-01 DIAGNOSIS — E876 Hypokalemia: Secondary | ICD-10-CM

## 2019-03-01 DIAGNOSIS — E1142 Type 2 diabetes mellitus with diabetic polyneuropathy: Secondary | ICD-10-CM | POA: Diagnosis not present

## 2019-03-01 DIAGNOSIS — E1165 Type 2 diabetes mellitus with hyperglycemia: Secondary | ICD-10-CM

## 2019-03-01 DIAGNOSIS — I69359 Hemiplegia and hemiparesis following cerebral infarction affecting unspecified side: Secondary | ICD-10-CM | POA: Diagnosis not present

## 2019-03-01 DIAGNOSIS — Z79899 Other long term (current) drug therapy: Secondary | ICD-10-CM

## 2019-03-01 DIAGNOSIS — G894 Chronic pain syndrome: Secondary | ICD-10-CM

## 2019-03-01 DIAGNOSIS — I10 Essential (primary) hypertension: Secondary | ICD-10-CM

## 2019-03-01 DIAGNOSIS — E782 Mixed hyperlipidemia: Secondary | ICD-10-CM | POA: Diagnosis not present

## 2019-03-01 DIAGNOSIS — R1110 Vomiting, unspecified: Secondary | ICD-10-CM | POA: Insufficient documentation

## 2019-03-01 MED ORDER — POTASSIUM CHLORIDE CRYS ER 20 MEQ PO TBCR: 40.0000 meq | EXTENDED_RELEASE_TABLET | Freq: Every day | ORAL | 5 refills | Status: DC

## 2019-03-01 MED ORDER — ONDANSETRON 4 MG PO TBDP: 4.0000 mg | ORAL_TABLET | Freq: Three times a day (TID) | ORAL | 1 refills | Status: DC | PRN

## 2019-03-01 MED ORDER — MIRABEGRON ER 50 MG PO TB24: 50.0000 mg | ORAL_TABLET | Freq: Every day | ORAL | 1 refills | Status: DC

## 2019-03-01 NOTE — Progress Notes (Signed)
Name: [name]   MRN: [id number]    DOB: 1965-10-10   Date:03/01/2019       Progress Note  Subjective  Chief Complaint  Chief Complaint  Patient presents with   Diabetes   Hypertension   Hyperlipidemia    I connected with  [name] on 03/01/19 at 10:00 AM EST by telephone and verified that I am speaking with the correct person using two identifiers.  I discussed the limitations, risks, security and privacy concerns of performing an evaluation and management service by telephone and the availability of in person appointments. Staff also discussed with the patient that there may be a patient responsible charge related to this service. Patient Location: Home Provider Location: [facility] Additional Individuals present: none  HPI  This is my first interaction with this patient who is a 54 y.o. female with a complicated medical history and medication list, with multiple providers involved and I have attempted to review a lot of her recent history, who f/u's today for med management on her problems [name] has been following and prior DME concerns.  The CCM team has been consulted and enrolled previously to help. An additional referral for an RN case manager was placed by [name] and scheduled for 02/27/19, with an unsuccessful telephone outreach attempted on 2/8. She noted she talked to them in past, but missed that call.   A note from [name] on 02/22/2019 was this: Patient requested shower chair and raised toilet seat at visit for AWV with [name] LPN yesterday. Given her multiple medical problems including history CVA, chronic pain with arthralgias, sacroiliac dysfunction, seizure disorder, and recurrent falls, these safety measures are appropriate.  I have placed orders for both - please reach out to patient as she was working on contacting her insurance to find out which DME supplier she is to use. The patient was offered and earlier appt with [name] for 2/4  and refused.   The patient plans to call [employer], her insurance carrier as [name] rec'ed and when she gets that information about suppliers, she should contact the CCM team to help move this forward. She was understanding of this.  She notes she still is battling the pain from her neuropathy as the major concern, and otherwise is ok.  Now taking gabapentin 400 mg in the morning and noon, and 800 mg at bedtime to help and has Percocet to use as needed.  She is followed by a pain doctor to help with this, and is encouraged to not take the Percocet often, and she is trying not to take that presently.  Discussed concerns with any regular use of that medicine, and the importance of trying to avoid that as much as possible.  She stated she understood that.  HTN - Her blood pressure has been followed with various outpatient appointments, and has been controlled with the medication.  She has seen cardiology in the past as well, and noted that her valve was a little leaky on a prior visit with cardiology.  She denies any recent increased chest pains or shortness of breath of concern.  Diabetes: She last saw Endocrine in 09/29/2018 with her A1C 9.6, and noted  has had DM > 30 years She rescheduled her next appt to Dec, then could not make that f/u, no reason given Emphasized today the importance of f/u with endocrine and she noted she will be seeing them in March for f/u. The Ass/Plan from their most recent visit is  as follows: Diabetes -  - Diabetes is uncontrolled, likely due in part to lack of consistent blood sugar monitoring and medication administration before meals, as well as possible diet indiscretions..  I will adjust medication regimen as follows to prevent hypoglycemia:   -Decrease Novolog  from 22 down to 18 units before meals, and decrease sliding scale before meals  - Continue Toujeo Max 136 units- in the evening  - Continue Trulicity 1.5 mg weekly on Sunday    Patient was advised of the  importance of regular monitoring of blood sugars.  - Instructed to monitor blood sugars 4 times daily, fasting in the morning, before lunch, before dinner and again before bed. - Reminded to bring blood sugar log and/or meter to every visit -Patient has previously stated her interest in a [employer] glucose monitoring system. I have advised her that she will need to have evidence of 4 blood sugar checks daily as well as insulin injections in order to qualify for insurance coverage. She states her understanding of this requirement and that she will monitor her blood sugars as requested and bring her blood sugar log to next clinic visit.  Hyperlipidemia - LDL at goal on recent check On a statin and continue  Med management: She requested having her potassium supplement refilled, noted she has been on that for many years, and takes 2 daily.  Her last labs were checked in November, and states she is due to have them checked within the next month at the latest, either with endocrine or cardiology follow-up appointments in early March.  She requested having her mirabegron ER prescription refilled.  She was started on that to help with her overactive bladder, and states it has been helpful.  She takes 1 daily.  She also requested having her Zofran refilled, she only uses this as needed when she has some nausea and stomach discomfort with potential vomiting.  She states she uses this "not that often", about 4 times in the past month when asked.  She states she has an upcoming appointment with gastroenterology as well.  She is also initially requested a refill of her Compazine product which she takes for nausea, although when discussed, she states she uses that when she is out of her Zofran, and it was mutually agreed not to refill this presently, as not feel that is needed if she has the Zofran to use as needed.  On review: She has left eye blindness post hematoma removal at age 53  She has a h/o  multiple CVAs which has limited her ambulatory abilities   Patient had her Medicare Annual Wellness exam on 02/21/19 with the following noted: Cardiac Risk Factors included: diabetes mellitus, dyslipidemia, obesity (BMI >30kg/m2), and hypertension Recently getting information to help with housing, needs assistance with transportation Requested a raised toilet seat and shower chair, which [name] ordered (and felt appropriate safety measures given her hx). Referred for medication management history  She sees pulmonary, cardiology, neurology, and endocrinology in the [facility] system and had refills on medications from providers in all of these specialities provided last on 02/22/19.  She last saw ENT 01/10/2019, Dr. Tami Ribas for chronic left mastoiditis, unable to review the note from this visit. She noted that she is much improved presently and f/u with Dr. Tami Ribas will be as needed in the future.   She last saw pulmonary on 01/03/19, followed for asthma, COPD, OSA, anc chronic bronchitis. Results from prior PFTs in 2015 noted: Noted to have severe persistant  poorly controlled asthma despite appropriate use of ICS and SABA with prednisone required multiple times in her past (11/2017 most recent) and prescribed again with a Z-Pak for emergency red zone asthma action plan. Also was to continue Wixela and albuterol inhs Her COPD was unstaged and a limited smoking hx (quit 1990) noted, but high amount of passive smoke exposure noted. She was noted to have significant seasonal allergies and on flonase Patient on CPAP for OSA with no issues reported and weight loss reced with decrease p.o. intake, more walking and goal losing 1 lb/week noted    Neuropathy -  Patient reports longstanding left-sided weakness for greater than 30 years with bilateral foot pain and numbness. Continue gabapentin. Neurology following. Negative brain MRI and EEG in July regarding limb weakness.   Last saw neurology 09/22/2018, with  the following A/P from that visit:  HISTORY OF CVA - Stable.  - Patient with history of multiple strokes, last stroke in 2013. Denies recent stroke-like symptoms. Patient has been seen by cardiology since her last visit.  - Continue Plavix 75 mg daily for secondary stroke prevention.  - Continue working with endocrinology for diabetes management.  Patient states no new stroke like symptoms. Taking plavix 75 mg no side effects. Seen a cardiologist. Taking Keppra 500 mg no side effects HISTORY OF SEIZURES - Stable.  - Patient with long history of lifetime seizures. Denies recent seizure-like activity.  - Will repeat EEG within the next 3-6 months.   Patient states no new seizures. Last seizure was when she has done the stress test. Has had a EEG.  DIABETIC NEUROPATHY - New. - Patient with history of diabetic neuropathy. - Increase gabapentin to 400 mg three times a day.  - May consider EMG (electromyography) of lower extremity at next appointment.  Patient states left side feels numbness and tingling from left shoulder to left foot. No falls. Taking gabapentin 400 mg 3 times day no side effects    Last saw cardilogy 08/24/2018, next visit planned 03/20/19 Followed by cardiology for significant PACs on Holter, mild cardiomyopathy 40-45% with moderate aortic insufficiency and poorly-controlled HTN noted on this last visit    Patient Active Problem List   Diagnosis Date Noted   Non-intractable vomiting 03/01/2019   History of CVA (cerebrovascular accident) 07/08/2018   History of seizure 07/08/2018   Diabetic polyneuropathy associated with type 2 diabetes mellitus ([place]) 03/02/2018   Obesity (BMI 30-39.9) 03/02/2018   Vitamin D deficiency 01/24/2018   Vulvovaginal candidiasis 01/24/2018   Hypokalemia 06/08/2017   Overactive bladder 05/18/2017   Asthma 06/22/2016   Dilated aortic root ([place])    Sacroiliac dysfunction 10/09/2014   Chronic low back pain 09/03/2014   CVA, old,  hemiparesis ([place]) 09/03/2014   OSA (obstructive sleep apnea) 05/15/2014   Restrictive airway disease 05/15/2014   Colitis 02/22/2014   Type 2 diabetes, uncontrolled, with renal manifestation ([place]) 01/21/2014   Chronic pain disorder 01/21/2014   Headache disorder 09/07/2013   Diabetic neuropathy ([place]) 05/23/2013   Mixed urge and stress incontinence 08/25/2011   Ureteral stricture, right 08/25/2011   Pyelonephritis 08/22/2011   Nephrolithiasis 08/22/2011   Incisional hernia 05/10/2011   Morbid obesity ([place]) 05/10/2011   Tachycardia 05/08/2011   BLINDNESS, LEGAL, [place] DEFINITION 09/14/2006   Hyperlipemia 09/13/2006   Essential hypertension 09/13/2006   MYOCARDIAL INFARCTION, HX OF 09/13/2006   GERD 09/13/2006   Seizure disorder ([place]) 09/13/2006    Past Surgical History:  Procedure Laterality Date   BRAIN HEMATOMA EVACUATION  five procedures total, first procedure when 28 months old, last at 54 years of age.   CYSTOSCOPY W/ RETROGRADES  09/24/2011   Procedure: CYSTOSCOPY WITH RETROGRADE PYELOGRAM;  Surgeon: Malka So, MD;  Location: [facility];  Service: Urology;  Laterality: Right;   CYSTOSCOPY WITH URETEROSCOPY  09/24/2011   Procedure: CYSTOSCOPY WITH URETEROSCOPY;  Surgeon: Malka So, MD;  Location: [facility];  Service: Urology;  Laterality: Right;  Balloon dilation right ureter    CYSTOSCOPY/RETROGRADE/URETEROSCOPY  08/24/2011   Procedure: CYSTOSCOPY/RETROGRADE/URETEROSCOPY;  Surgeon: Molli Hazard, MD;  Location: [facility];  Service: Urology;  Laterality: Right;  Cysto, Right retrograde Pyelogram, right stent placement.    INCISIONAL HERNIA REPAIR  05/05/2011   Procedure: LAPAROSCOPIC INCISIONAL HERNIA;  Surgeon: Rolm Bookbinder, MD;  Location: [facility];  Service: General;  Laterality: N/A;   kidney stone removal     MASS EXCISION  05/05/2011   Procedure: EXCISION MASS;  Surgeon: Rolm Bookbinder, MD;  Location: [facility];  Service: General;  Laterality:  Right;   [facility]     RETINAL DETACHMENT SURGERY Left 1990   SHUNT REMOVAL     shunt inserted at age 51 removed at age 46    [place]    Family History  Problem Relation Age of Onset   Uterine cancer Mother    Hypertension Mother    Cancer Mother    Brain cancer Maternal Grandmother    Hypertension Maternal Grandmother    Birth defects Daughter    Hypertension Daughter    Cirrhosis Maternal Grandfather    ADD / ADHD Maternal Grandfather    Birth defects Maternal Grandfather    Diabetes Maternal Grandfather    Anesthesia problems Neg Hx    Colon cancer Neg Hx    Esophageal cancer Neg Hx    Pancreatic cancer Neg Hx     Social History   Socioeconomic History   Marital status: Widowed    Spouse name: Not on file   Number of children: 2   Years of education: Not on file   Highest education level: Not on file  Occupational History   Occupation: disabled  Tobacco Use   Smoking status: Former Smoker    Packs/day: 1.00    Years: 15.00    Pack years: 15.00    Types: Cigarettes    Quit date: 01/20/1987    Years since quitting: 32.1   Smokeless tobacco: Never Used   Tobacco comment: quit more than 20 years  Substance and Sexual Activity   Alcohol use: Never    Alcohol/week: 0.0 standard drinks   Drug use: No    Comment: hx of marijuana use no longer uses    Sexual activity: Yes    Partners: Male    Birth control/protection: Surgical  Other Topics Concern   Not on file  Social History Narrative   Not on file   Social Determinants of Health   Financial Resource Strain: Low Risk    Difficulty of Paying Living Expenses: Not very hard  Food Insecurity: No Food Insecurity   Worried About Running Out of Food in the Last Year: Never true   [place] in the Last Year: Never true  Transportation Needs: No Transportation Needs   Lack of Transportation (Medical): No   Lack of Transportation (Non-Medical): No  Physical  Activity: Insufficiently Active   Days of Exercise per Week: 2 days   Minutes of Exercise per Session: 20 min  Stress: No Stress Concern  Present   Feeling of Stress : Only a little  Social Connections: Slightly Isolated   Frequency of Communication with Friends and Family: More than three times a week   Frequency of Social Gatherings with Friends and Family: More than three times a week   Attends Religious Services: More than 4 times per year   Active Member of [employer] or Organizations: Yes   Attends Archivist Meetings: More than 4 times per year   Marital Status: Widowed  Human resources officer Violence: Not At Risk   Fear of Current or Ex-Partner: No   Emotionally Abused: No   Physically Abused: No   Sexually Abused: No     Current Outpatient Medications:    albuterol (ACCUNEB) 1.25 MG/3ML nebulizer solution, Inhale into the lungs. Use 3 mls by nebulization every 6 hours as needed for wheezing, Disp: , Rfl:    albuterol (PROAIR HFA) 108 (90 Base) MCG/ACT inhaler, INHALE 2 PUFFS BY MOUTH EVERY 6 HOURS AS NEEDED FOR WHEEZING OR SHORTNESS OF BREATH, Disp: 3 Inhaler, Rfl: 3   AMBULATORY NON FORMULARY MEDICATION, Medication Name: Muscle and Joint Vanishing Scent Gel- Apply as needed to skin, Disp: , Rfl:    amLODipine (NORVASC) 5 MG tablet, Take 1 tablet (5 mg total) by mouth daily., Disp: 90 tablet, Rfl: 3   baclofen (LIORESAL) 10 MG tablet, Take 1 tablet (10 mg total) by mouth 3 (three) times daily., Disp: 90 tablet, Rfl: 6   Blood Glucose Monitoring Suppl (ACCU-CHEK AVIVA PLUS) w/Device KIT, 1 each by XX route as directed for up to 1 day, Disp: , Rfl:    budesonide (PULMICORT) 0.5 MG/2ML nebulizer solution, Inhale into the lungs. Take 2 mls by nebulizations once daily, Disp: , Rfl:    Calcium Carbonate-Vitamin D (CALCIUM-VITAMIN D3 PO), D3 500- calcium 630 mg Take 1 capsule by mouth once daily, Disp: , Rfl:    carvedilol (COREG) 3.125 MG tablet, Take 1 tablet by  mouth 2 (two) times daily., Disp: , Rfl:    clopidogrel (PLAVIX) 75 MG tablet, Take 1 tablet (75 mg total) by mouth daily., Disp: 90 tablet, Rfl: 0   cycloSPORINE (RESTASIS) 0.05 % ophthalmic emulsion, Place 2 drops into both eyes 2 (two) times daily., Disp: , Rfl:    Dulaglutide 1.5 MG/0.5ML SOPN, Inject 1.5 mg into the skin once a week., Disp: 12 pen, Rfl: 3   DULoxetine (CYMBALTA) 20 MG capsule, Take 2 capsules (40 mg total) by mouth daily., Disp: 60 capsule, Rfl: 2   ezetimibe (ZETIA) 10 MG tablet, Take 1 tablet (10 mg total) by mouth daily., Disp: 90 tablet, Rfl: 0   fluorometholone (FML) 0.1 % ophthalmic suspension, Place 2 drops into both eyes 2 (two) times daily., Disp: , Rfl:    fluticasone (FLONASE) 50 MCG/ACT nasal spray, Place 2 sprays into both nostrils daily., Disp: 16 g, Rfl: 3   Fluticasone-Salmeterol (ADVAIR DISKUS) 250-50 MCG/DOSE AEPB, Inhale 1 puff into the lungs 2 (two) times daily., Disp: 1 each, Rfl: 3   Fluticasone-Salmeterol (WIXELA INHUB) 100-50 MCG/DOSE AEPB, Inhale 1 puff into the lungs 2 (two) times daily., Disp: , Rfl:    gabapentin (NEURONTIN) 400 MG capsule, 400 mg qAM, 400 mg qPM, 800 mg qhs, Disp: 120 capsule, Rfl: 2   glucose blood (ACCU-CHEK AVIVA PLUS) test strip, Use 1 each (1 strip total) 4 (four) times daily for 90 days Use as instructed., Disp: , Rfl:    insulin aspart (NOVOLOG FLEXPEN) 100 UNIT/ML FlexPen, Inject into the skin., Disp: ,  Rfl:    Insulin Glargine, 2 Unit Dial, 300 UNIT/ML SOPN, Inject 120 Units into the skin daily. (Patient taking differently: Inject 136 Units into the skin daily. ), Disp: 15 mL, Rfl: 1   Insulin Pen Needle (EASY TOUCH PEN NEEDLES) 31G X 8 MM MISC, Use as directed in giving insulin. Dx: E11.29, Disp: 400 each, Rfl: 3   levETIRAcetam (KEPPRA) 500 MG tablet, Take 500 mg by mouth 2 (two) times daily., Disp: , Rfl:    losartan (COZAAR) 100 MG tablet, Take 1 tablet (100 mg total) by mouth daily., Disp: 90 tablet,  Rfl: 3   mirabegron ER (MYRBETRIQ) 50 MG TB24 tablet, Take 1 tablet (50 mg total) by mouth daily., Disp: 90 tablet, Rfl: 1   ondansetron (ZOFRAN ODT) 4 MG disintegrating tablet, Take 1 tablet (4 mg total) by mouth every 8 (eight) hours as needed for nausea or vomiting., Disp: 20 tablet, Rfl: 1   ONETOUCH DELICA LANCETS 26Z MISC, Use to check sugar three times daily as directed.Dx: E11.29; E11.40, Disp: 300 each, Rfl: 3   oxyCODONE-acetaminophen (PERCOCET) 5-325 MG tablet, Take 1 tablet by mouth every 4 (four) hours as needed for severe pain., Disp: 12 tablet, Rfl: 0   pantoprazole (PROTONIX) 40 MG tablet, Take 1 tablet (40 mg total) by mouth daily., Disp: 90 tablet, Rfl: 0   polyethylene glycol (MIRALAX / GLYCOLAX) packet, Take 17 g by mouth daily as needed for mild constipation., Disp: 100 each, Rfl: 3   potassium chloride SA (KLOR-CON M20) 20 MEQ tablet, Take 2 tablets (40 mEq total) by mouth daily., Disp: 60 tablet, Rfl: 5   prochlorperazine (COMPAZINE) 10 MG tablet, Take 1 tablet (10 mg total) by mouth every 6 (six) hours as needed for nausea or vomiting., Disp: 30 tablet, Rfl: 0   simvastatin (ZOCOR) 20 MG tablet, Take 1 tablet (20 mg total) by mouth at bedtime., Disp: 90 tablet, Rfl: 0   spironolactone (ALDACTONE) 25 MG tablet, Take 1 tablet by mouth daily., Disp: , Rfl:    sucralfate (CARAFATE) 1 g tablet, Take 1 tablet (1 g total) by mouth 4 (four) times daily for 10 days., Disp: 40 tablet, Rfl: 0   predniSONE (DELTASONE) 20 MG tablet, Take 1 tablet by mouth daily., Disp: , Rfl:   Allergies  Allergen Reactions   Codeine Swelling and Other (See Comments)    Swelling and burning of mouth (inside)   Lactose Intolerance (Gi) Nausea And Vomiting    With staff assistance, above reviewed with the patient today.  ROS: As per HPI, otherwise no specific complaints on a limited and focused system review   Objective  Virtual encounter, vitals not obtained.  Body mass index is  35.51 kg/m.  Physical Exam   Appears in NAD via conversation No obvious respiratory distress. Speaking in complete sentences Neurological: Pt is alert and oriented, Speech is normal Psychiatric: Patient has a normal mood and affect, behavior is normal. Judgment and thought content normal.   No results found for this or any previous visit (from the past 72 hour(s)).  PHQ2/9: Depression screen [facility] 2/9 03/01/2019 02/21/2019 12/06/2018 11/28/2018 11/14/2018  Decreased Interest 0 0 0 0 0  Down, Depressed, Hopeless 0 0 0 0 0  PHQ - 2 Score 0 0 0 0 0  Altered sleeping 1 - - 0 0  Tired, decreased energy 1 - - 0 0  Change in appetite 0 - - 0 0  Feeling bad or failure about yourself  0 - - 0  0  Trouble concentrating 0 - - 0 0  Moving slowly or fidgety/restless 0 - - 0 0  Suicidal thoughts 0 - - 0 0  PHQ-9 Score 2 - - 0 0  Difficult doing work/chores Not difficult at all - - Not difficult at all Not difficult at all  Some recent data might be hidden   PHQ-2/9 Result reviewed, neg  Fall Risk: Fall Risk  03/01/2019 02/21/2019 12/06/2018 11/28/2018 11/14/2018  Falls in the past year? 0 1 1 1 1   Number falls in past yr: 1 1 1 1 1   Injury with Fall? 0 0 0 0 0  Risk for fall due to : - History of fall(s);Impaired balance/gait;Impaired vision - - History of fall(s)  Risk for fall due to: Comment - - - - -  Follow up - Falls evaluation completed - Falls evaluation completed -     Assessment & Plan  1. CVA, old, hemiparesis ([place])  DME requests prior as noted and addressed as noted above  Currently noted she does not drive, and daughters help, with one daughter a CMA noted.   2. Hypokalemia  - potassium chloride SA (KLOR-CON M20) 20 MEQ tablet; Take 2 tablets (40 mEq total) by mouth daily.  Dispense: 60 tablet; Refill: 5 Emphasized the importance if she does not have her labs checked within the next month to follow-up here again as we will need to have the potasium level checked, especially  noting that she is on spironolactone and her regimen.  Did review most recent lab checks.  3. Overactive bladder - mirabegron ER (MYRBETRIQ) 50 MG TB24 tablet; Take 1 tablet (50 mg total) by mouth daily.  Dispense: 90 tablet; Refill: 1 Okay to continue the above medicine as she notes is helpful.  4. Non-intractable vomiting with nausea, unspecified vomiting type  - ondansetron (ZOFRAN ODT) 4 MG disintegrating tablet; Take 1 tablet (4 mg total) by mouth every 8 (eight) hours as needed for nausea or vomiting.  Dispense: 20 tablet; Refill: 1 Will not refill the Compazine as noted above.  5. Polypharmacy concerns  Discussed this with her today, and the importance of trying to simplify her medical regimen as much as possible over time.  She noted that was mentioned by [name] in the past and is working towards that moving forward.  6. HTN  Continue meds, Has f/u with cardiology scheduled for 3/1   7. DM with neuropathy  Continue to f/u with endocrine and importance of keeping f/u appts emphasized  8. Hyperlipidemia  Continue statin  9. Chronic pain  Continue with pain management Minimizing prn opioid use really encouraged again today  I discussed the assessment and treatment plan with the patient. The patient was provided an opportunity to ask questions and all were answered. The patient agreed with the plan and demonstrated an understanding of the instructions.  I provided 30+ minutes of non-face-to-face time during this encounter that included discussing at length patient's sx/history, pertinent pmhx, medications, treatment and follow up plan. This time also included the necessary documentation, orders, and chart review.  Towanda Malkin, MD

## 2019-03-01 NOTE — Telephone Encounter (Signed)
Copied from [place] [phone] [phone]. Topic: General - Inquiry >> Mar 01, 2019 10:52 AM Richardo Priest, NT wrote: Reason for CRM: Pt called in stating she spoke with her insurance company in regards to the raised toilet seat and shower chair. Pt states insurance will not cover these items. [facility] is the pharmacy that will be taking care of this for her and their phone number is [phone].

## 2019-03-03 ENCOUNTER — Ambulatory Visit: Payer: Self-pay | Admitting: Pharmacist

## 2019-03-07 ENCOUNTER — Encounter: Payer: Medicare HMO | Admitting: Student in an Organized Health Care Education/Training Program

## 2019-03-07 DIAGNOSIS — J449 Chronic obstructive pulmonary disease, unspecified: Secondary | ICD-10-CM | POA: Diagnosis not present

## 2019-03-08 ENCOUNTER — Telehealth: Payer: Self-pay

## 2019-03-08 NOTE — Telephone Encounter (Signed)
Copied from [place] [phone] [id number]. Topic: Referral - Status >> Mar 08, 2019  6:96 PM Simone Curia D wrote: [birth year]41 Spoke with patient about her completed [employer] application that [employer] had the physician to complete and is ready for her signature. Patient requested that I resend her information to her email. Also stated that she did not have any other needs at this time.  Ambrose Mantle [phone]

## 2019-03-10 ENCOUNTER — Ambulatory Visit: Payer: Self-pay

## 2019-03-10 ENCOUNTER — Telehealth: Payer: Medicare HMO

## 2019-03-10 NOTE — Chronic Care Management (AMB) (Signed)
  Chronic Care Management   Outreach Note  03/10/2019 Name: [name] MRN: [id number] DOB: [birth year]  Primary Care Provider: Hubbard Hartshorn, [name] Reason for referral : Chronic Care Management    A second unsuccessful telephone outreach was attempted today. [name] was referred to the case management team for assistance with care management and care coordination.   A HIPAA compliant voice message was left today requesting a return call.    Follow Up Plan: The care management team will reach out to [name] again within the next two weeks.    [facility] [phone]

## 2019-03-14 NOTE — Chronic Care Management (AMB) (Signed)
  Chronic Care Management   Note  03/14/2019-late entry Name: [name] MRN: [id number] DOB: 1965-01-24  54 y.o. year old female referred to Chronic Care Management by [name] for medication management.   Was unable to reach patient via telephone today and have left HIPAA compliant voicemail asking patient to return my call. (unsuccessful outreach #1).  Follow up plan: A HIPPA compliant phone message was left for the patient providing contact information and requesting a return call.  The care management team will reach out to the patient again over the next 5-10 days.   Ruben Reason, PharmD Clinical Pharmacist [facility] [phone]

## 2019-03-15 ENCOUNTER — Ambulatory Visit: Payer: Self-pay | Admitting: Pharmacist

## 2019-03-15 NOTE — Chronic Care Management (AMB) (Signed)
  Chronic Care Management   Note  03/15/2019-late entry Name: Chase Knebel MRN: [id number] DOB: 11/30/1965  54 y.o. year old female referred to Chronic Care Management by [name] for medication management.   Was unable to reach patient via telephone today and have left HIPAA compliant voicemail asking patient to return my call. (unsuccessful outreach #2).  Follow up plan: A HIPPA compliant phone message was left for the patient providing contact information and requesting a return call.  The care management team will reach out to the patient again over the next 5-10 days.   Ruben Reason, PharmD Clinical Pharmacist [facility] [phone]

## 2019-03-22 ENCOUNTER — Other Ambulatory Visit: Payer: Self-pay

## 2019-03-22 ENCOUNTER — Ambulatory Visit
Payer: Medicare HMO | Attending: Student in an Organized Health Care Education/Training Program | Admitting: Student in an Organized Health Care Education/Training Program

## 2019-03-23 ENCOUNTER — Encounter: Payer: Medicare HMO | Admitting: Student in an Organized Health Care Education/Training Program

## 2019-03-23 ENCOUNTER — Ambulatory Visit: Payer: Self-pay | Admitting: Pharmacist

## 2019-03-24 ENCOUNTER — Ambulatory Visit: Payer: Self-pay

## 2019-03-24 ENCOUNTER — Telehealth: Payer: Medicare HMO

## 2019-03-24 NOTE — Chronic Care Management (AMB) (Signed)
  Chronic Care Management   Outreach Note  03/24/2019 Name: [name] MRN: [id number] DOB: 15-Jun-1965  Referred by: Hubbard Hartshorn, [name] Reason for referral : Chronic Care Management   An unsuccessful telephone outreach was attempted today. [name] was previously engaged with the Chronic Care Management team.  A HIPAA compliant voice message was left today requesting a return call.   Follow Up Plan:  The care management team will attempt to reach [name] again within the next two weeks. Will plan to reengage if she is still interested in services.   [facility] [phone]

## 2019-03-28 ENCOUNTER — Encounter: Payer: Self-pay | Admitting: Family Medicine

## 2019-03-28 DIAGNOSIS — Z6838 Body mass index (BMI) 38.0-38.9, adult: Secondary | ICD-10-CM | POA: Diagnosis not present

## 2019-03-28 DIAGNOSIS — E6609 Other obesity due to excess calories: Secondary | ICD-10-CM | POA: Diagnosis not present

## 2019-03-28 DIAGNOSIS — J4541 Moderate persistent asthma with (acute) exacerbation: Secondary | ICD-10-CM | POA: Diagnosis not present

## 2019-03-28 DIAGNOSIS — J4550 Severe persistent asthma, uncomplicated: Secondary | ICD-10-CM | POA: Diagnosis not present

## 2019-03-28 DIAGNOSIS — Z1331 Encounter for screening for depression: Secondary | ICD-10-CM | POA: Diagnosis not present

## 2019-03-31 NOTE — Chronic Care Management (AMB) (Signed)
  Chronic Care Management   Note  03/31/2019-late entry Name: [name] MRN: [id number] DOB: 16-Nov-1965  54 y.o. year old female referred to Chronic Care Management by [name] for medication management.   Was unable to reach patient via telephone today and have left HIPAA compliant voicemail asking patient to return my call. (unsuccessful outreach #3).  Follow up plan: A HIPAA complaint voicemail was left for patient. No further outreach will be made at this time per [place], PharmD Clinical Pharmacist [facility] [phone]

## 2019-04-02 ENCOUNTER — Encounter: Payer: Self-pay | Admitting: Family Medicine

## 2019-04-03 ENCOUNTER — Encounter: Payer: Self-pay | Admitting: Family Medicine

## 2019-04-04 DIAGNOSIS — J449 Chronic obstructive pulmonary disease, unspecified: Secondary | ICD-10-CM | POA: Diagnosis not present

## 2019-04-07 ENCOUNTER — Ambulatory Visit: Payer: Self-pay

## 2019-04-07 ENCOUNTER — Telehealth: Payer: Medicare HMO

## 2019-04-07 NOTE — Chronic Care Management (AMB) (Signed)
  Chronic Care Management   Outreach Note  04/07/2019 Name: [name] MRN: [id number] DOB: 1965/10/25  Primary Care Provider: Hubbard Hartshorn, [name] Reason for referral : Chronic Case Management   Another unsuccessful telephone outreach was attempted today. Ms. Vanderweele was previously engaged with the Chronic Case Management team. Per Epic, she contacted the Primary Care clinic on 04/03/19.  A HIPAA compliant voice message was left today requesting a return call.     Follow Up Plan:  The care management team will make another attempt next week. Will plan to engage if she is still interested in receiving assistance.    [facility] [phone]

## 2019-04-08 ENCOUNTER — Other Ambulatory Visit: Payer: Self-pay | Admitting: Student in an Organized Health Care Education/Training Program

## 2019-04-08 DIAGNOSIS — E1142 Type 2 diabetes mellitus with diabetic polyneuropathy: Secondary | ICD-10-CM

## 2019-04-08 DIAGNOSIS — M791 Myalgia, unspecified site: Secondary | ICD-10-CM

## 2019-04-10 ENCOUNTER — Ambulatory Visit: Payer: Medicare HMO

## 2019-04-10 ENCOUNTER — Other Ambulatory Visit: Payer: Self-pay

## 2019-04-11 ENCOUNTER — Telehealth: Payer: Medicare HMO | Admitting: Student in an Organized Health Care Education/Training Program

## 2019-04-12 ENCOUNTER — Ambulatory Visit: Payer: Medicare HMO | Admitting: Student in an Organized Health Care Education/Training Program

## 2019-04-12 ENCOUNTER — Other Ambulatory Visit
Admission: RE | Admit: 2019-04-12 | Discharge: 2019-04-12 | Disposition: A | Discharge status: Home / Self Care | Payer: Medicare HMO | Source: Ambulatory Visit | Attending: Internal Medicine | Admitting: Internal Medicine

## 2019-04-12 DIAGNOSIS — Z79899 Other long term (current) drug therapy: Secondary | ICD-10-CM | POA: Insufficient documentation

## 2019-04-12 DIAGNOSIS — Z01812 Encounter for preprocedural laboratory examination: Secondary | ICD-10-CM | POA: Insufficient documentation

## 2019-04-12 DIAGNOSIS — E782 Mixed hyperlipidemia: Secondary | ICD-10-CM | POA: Diagnosis not present

## 2019-04-12 DIAGNOSIS — Z01818 Encounter for other preprocedural examination: Secondary | ICD-10-CM | POA: Diagnosis not present

## 2019-04-12 DIAGNOSIS — I10 Essential (primary) hypertension: Secondary | ICD-10-CM | POA: Diagnosis not present

## 2019-04-12 DIAGNOSIS — Z8673 Personal history of transient ischemic attack (TIA), and cerebral infarction without residual deficits: Secondary | ICD-10-CM | POA: Diagnosis not present

## 2019-04-12 DIAGNOSIS — R0789 Other chest pain: Secondary | ICD-10-CM | POA: Insufficient documentation

## 2019-04-12 DIAGNOSIS — R0602 Shortness of breath: Secondary | ICD-10-CM | POA: Diagnosis not present

## 2019-04-12 DIAGNOSIS — R002 Palpitations: Secondary | ICD-10-CM | POA: Diagnosis not present

## 2019-04-12 LAB — BRAIN NATRIURETIC PEPTIDE: B Natriuretic Peptide: 67 pg/mL (ref 0.0–100.0)

## 2019-04-13 ENCOUNTER — Ambulatory Visit: Payer: Medicare HMO | Admitting: Gastroenterology

## 2019-04-14 ENCOUNTER — Encounter: Payer: Self-pay | Admitting: Family Medicine

## 2019-04-17 ENCOUNTER — Ambulatory Visit: Payer: Self-pay

## 2019-04-17 ENCOUNTER — Telehealth: Payer: Self-pay

## 2019-04-17 NOTE — Chronic Care Management (AMB) (Signed)
  Chronic Care Management   Outreach Note  04/17/2019 Name: [name] MRN: [id number] DOB: May 06, 1965    Primary Care Provider: Hubbard Hartshorn, [name] Reason for referral : Chronic Care Management   An unsuccessful telephone outreach was attempted today. Ms. Nuncio was referred to the case management team for assistance with care management and care coordination. I was unable to reach Ms. Huntsberry or her Occupational hygienist.  HIPAA compliant voice messages were left for Ms. Nicolson and her daughter requesting a return call when they are available.   Follow Up Plan:  The care management team will reach out to Ms. Shingler and [place] again next week.   [facility] [phone]

## 2019-04-17 NOTE — Progress Notes (Signed)
Name: [name]   MRN: [id number]    DOB: 11-08-1965   Date:04/18/2019       Progress Note  Subjective  Chief Complaint  Chief Complaint  Patient presents with  . Hypertension  . Hyperlipidemia  . Medication Refill    I connected with  [name]  on 04/18/19 at  9:40 AM EDT by a video enabled telemedicine application and verified that I am speaking with the correct person using two identifiers.  I discussed the limitations of evaluation and management by telemedicine and the availability of in person appointments. The patient expressed understanding and agreed to proceed. Staff also discussed with the patient that there may be a patient responsible charge related to this service. Patient Location: Home Provider Location: [facility] Additional Individuals present: none  HPI Patient is a 54 y.o. female with a complicated medical history and medication list, with multiple providers involved who I first met in February with that note reviewed. She sees pulmonary, cardiology, neurology, and endocrinology in the [place] system  She f/u's today for medication refills  The CCM team has been consulted in the past and enrolled to help.  She last saw cardiology 04/12/2019, with a cardiac catheterization scheduled in April due to chest pain concerns, arm pains, an ? Blockage. She had labs done at that visit which included a an unremarkable CBC, with a comp panel showing an elevated glucose of 165 with the remainder of the panel normal.   HTN - Her blood pressure has been followed with various outpatient appointments, and has been a little higher in the recent past..  She has seen cardiology in the past as well, who has been helpful with her blood pressure management and with some recent chest pains noted, a catheterization has been scheduled for April. Followed by cardiology for significant PACs on Holter, mild cardiomyopathy 40-45% with moderate aortic insufficiency and  poorly-controlled HTN as well. BP Readings from Last 3 Encounters:  04/18/19 (!) 174/100  12/06/18 135/89  12/05/18 (!) 154/107  Takes BP at home, with BP higher today also noted. Checked machine at cardiology office and ok. Cardiology following, not want to change any medicines before the procedure in April. Intermittent HA's, takes ES tylenol  Diabetes: She last saw Endocrine in 09/29/2018 with her A1C 9.6, and noted  has had DM > 30 years She rescheduled her next appt to Dec, then could not make that f/u, no reason given Emphasized today the importance of f/u with endocrine and she noted she then was planning on seeing them in March, although that has not yet occurred. The Ass/Plan from their most recent visit: Diabetes -  - Diabetes is uncontrolled, likely due in part to lack of consistent blood sugar monitoring and medication administration before meals, as well as possible diet indiscretions..  I will adjust medication regimen as follows to prevent hypoglycemia:  -DecreaseNovologfrom 22 down to 18 units before meals, and decreasesliding scale before meals - ContinueToujeo Max 136 units- in theevening - ContinueTrulicity 1.5 mg weekly on Sunday  Patient was advised of the importance of regular monitoring of blood sugars.  - Instructed to monitor blood sugars 4 times daily, fasting in the morning, before lunch, before dinner and again before bed. - Reminded to bring blood sugar log and/or meter to every visit -Patient has previously stated her interest in a [employer] glucose monitoring system. I have advised her that she will need to have evidence of 4 blood sugar checks daily  as well as insulin injections in order to qualify for insurance coverage. She states her understanding of this requirement and that she will monitor her blood sugars as requested and bring her blood sugar log to next clinic visit. I did note her blood sugar was slightly elevated on recent labs, and  strongly encouraged following up with endocrinology, has appt next month..  Hyperlipidemia: LDL at goal on prior check. Lab Results  Component Value Date   CHOL 145 10/12/2018   HDL 42 (L) 10/12/2018   LDLCALC 83 10/12/2018   TRIG 106 10/12/2018   CHOLHDL 3.5 10/12/2018   On a statin and Zetia and continue.   Med management: She still takes her potassium supplement refilled, noted she has been on that for many years, and takes 2 daily.   Her last labs were checked in March, potassium and kidney function levels were normal.  She requested having her mirabegron ER prescription refilled.  She was started on that to help with her overactive bladder, and states it has been helpful.  She takes 1 daily.  She also requested having her Zofran refilled, she only uses this as needed when she has some nausea and stomach discomfort with potential vomiting.  She states she uses this "not that often". She is also requested a refill of her Compazine product which she takes for nausea, although when discussed, she states she uses that when she is out of her Zofran.  Did not refill last visit, I did not feel she needed 2 products for nausea.  She was not sure which of the two when discussed today, and again noted using the Zofran product as needed.  Does not feel needs to have the Compazine, and she agreed.  On review: She has left eye blindness post hematoma removal at age 76  She has a h/o multiple CVAs which has limited her ambulatory abilities   obesity (BMI >30kg/m2),  Wt Readings from Last 3 Encounters:  04/18/19 224 lb (101.6 kg)  03/01/19 220 lb (99.8 kg)  02/21/19 220 lb (99.8 kg)  Monitoring her weight presently, has remained fairly stable.  She last saw pulmonary on 03/28/2019, followed for asthma, COPD, OSA, and chronic bronchitis. Noted to have severe persistant poorly controlled asthma: Asthma severe persistant  - currently poorly controlled despite appropriate use of ICS and  SABA -  - required prednisone multiple times in past most recently on 11/2017 - will continue with wixela and albuterol  -will prescribe prednisone and zithrmax to keep for emergency red zone asthma action plan  -patient required the use of systemic steroids x2 in the past 3 months. -patient requires use of systemic steroids >50% of the year despite using high dose ICS, LABA, LAMA, singulair.  -solumedrol 40 IM x 1 today for acute exacerbation of asthma -Dupixent ordering today   COPD - unstaged - unclear diagnosis as patient has limited smoking hx but had high amount of passive smoke exposure (3 packs daily from husband indoor smoking)  Current inhalers: wixela and albuterol  She was noted to have significant seasonal allergies and on flonase Patient on CPAP for OSA with no issues reported and weight loss rec'ed with decrease p.o. intake, more walking    Neuropathy -  Patient reports longstanding left-sided weakness for greater than 30 years with bilateral foot pain and numbness. Continue gabapentin. Neurology following. Negative brain MRI and EEG in July regarding limb weakness.   Last saw neurology 09/22/2018, with the following A/P from that visit:  HISTORY OF CVA - Stable.  - Patient with history of multiple strokes, last stroke in 2013. Denies recent stroke-like symptoms. Patient has been seen by cardiology since her last visit.  - Continue Plavix 75 mg daily for secondary stroke prevention.  - Continue working with endocrinology for diabetes management.  Patient states no new stroke like symptoms. Taking plavix 75 mg no side effects. Seen a cardiologist. Taking Keppra 500 mg no side effects HISTORY OF SEIZURES - Stable.  - Patient with long history of lifetime seizures. Denies recent seizure-like activity.  - Will repeat EEG within the next 3-6 months.  Patient states no new seizures. Last seizure was when she has done the stress test. Has had a EEG.  DIABETIC NEUROPATHY -  New. - Patient with history of diabetic neuropathy. - Increase gabapentin to 400 mg three times a day.  - May consider EMG (electromyography) of lower extremity at next appointment.  Patient states left side feels numbness and tingling from left shoulder to left foot. No falls. Taking gabapentin 400 mg 3 times day no side effects   He notes the neuropathy has been fairly well controlled, although at times still bothersome.  It was recommended she follow-up with neurology, and she is going to make appt to f/u again.    Patient Active Problem List   Diagnosis Date Noted  . Non-intractable vomiting 03/01/2019  . History of CVA (cerebrovascular accident) 07/08/2018  . History of seizure 07/08/2018  . Diabetic polyneuropathy associated with type 2 diabetes mellitus ([place]) 03/02/2018  . Obesity (BMI 30-39.9) 03/02/2018  . Vitamin D deficiency 01/24/2018  . Vulvovaginal candidiasis 01/24/2018  . Hypokalemia 06/08/2017  . Overactive bladder 05/18/2017  . Asthma 06/22/2016  . Dilated aortic root ([place])   . Sacroiliac dysfunction 10/09/2014  . Chronic low back pain 09/03/2014  . CVA, old, hemiparesis ([place]) 09/03/2014  . OSA (obstructive sleep apnea) 05/15/2014  . Restrictive airway disease 05/15/2014  . Colitis 02/22/2014  . Type 2 diabetes, uncontrolled, with renal manifestation ([place]) 01/21/2014  . Chronic pain disorder 01/21/2014  . Headache disorder 09/07/2013  . Diabetic neuropathy ([place]) 05/23/2013  . Mixed urge and stress incontinence 08/25/2011  . Ureteral stricture, right 08/25/2011  . Pyelonephritis 08/22/2011  . Nephrolithiasis 08/22/2011  . Incisional hernia 05/10/2011  . Morbid obesity ([place]) 05/10/2011  . Tachycardia 05/08/2011  . BLINDNESS, [place] DEFINITION 09/14/2006  . Hyperlipemia 09/13/2006  . Essential hypertension 09/13/2006  . MYOCARDIAL INFARCTION, HX OF 09/13/2006  . GERD 09/13/2006  . Seizure disorder ([place]) 09/13/2006    Past Surgical History:    Procedure Laterality Date  . BRAIN HEMATOMA EVACUATION     five procedures total, first procedure when 35 months old, last at 54 years of age.  . CYSTOSCOPY W/ RETROGRADES  09/24/2011   Procedure: CYSTOSCOPY WITH RETROGRADE PYELOGRAM;  Surgeon: Malka So, MD;  Location: [facility];  Service: Urology;  Laterality: Right;  . CYSTOSCOPY WITH URETEROSCOPY  09/24/2011   Procedure: CYSTOSCOPY WITH URETEROSCOPY;  Surgeon: Malka So, MD;  Location: [facility];  Service: Urology;  Laterality: Right;  Balloon dilation right ureter   . CYSTOSCOPY/RETROGRADE/URETEROSCOPY  08/24/2011   Procedure: CYSTOSCOPY/RETROGRADE/URETEROSCOPY;  Surgeon: Molli Hazard, MD;  Location: [facility];  Service: Urology;  Laterality: Right;  Cysto, Right retrograde Pyelogram, right stent placement.   Fatima Blank HERNIA REPAIR  05/05/2011   Procedure: LAPAROSCOPIC INCISIONAL HERNIA;  Surgeon: Rolm Bookbinder, MD;  Location: [facility];  Service: General;  Laterality: N/A;  . kidney  stone removal    . MASS EXCISION  05/05/2011   Procedure: EXCISION MASS;  Surgeon: Rolm Bookbinder, MD;  Location: [facility];  Service: General;  Laterality: Right;  . PCNL    . RETINAL DETACHMENT SURGERY Left 1990  . SHUNT REMOVAL     shunt inserted at age 50 removed at age 71   . VAGINAL HYSTERECTOMY  1996    Family History  Problem Relation Age of Onset  . Uterine cancer Mother   . Hypertension Mother   . Cancer Mother   . Brain cancer Maternal Grandmother   . Hypertension Maternal Grandmother   . Birth defects Daughter   . Hypertension Daughter   . Cirrhosis Maternal Grandfather   . ADD / ADHD Maternal Grandfather   . Birth defects Maternal Grandfather   . Diabetes Maternal Grandfather   . Anesthesia problems Neg Hx   . Colon cancer Neg Hx   . Esophageal cancer Neg Hx   . Pancreatic cancer Neg Hx     Social History   Tobacco Use  . Smoking status: Former Smoker    Packs/day: 1.00    Years: 15.00    Pack years: 15.00    Types:  Cigarettes    Quit date: 01/20/1987    Years since quitting: 32.2  . Smokeless tobacco: Never Used  . Tobacco comment: quit more than 20 years  Substance Use Topics  . Alcohol use: Never    Alcohol/week: 0.0 standard drinks     Current Outpatient Medications:  .  albuterol (ACCUNEB) 1.25 MG/3ML nebulizer solution, Inhale into the lungs. Use 3 mls by nebulization every 6 hours as needed for wheezing, Disp: , Rfl:  .  albuterol (PROAIR HFA) 108 (90 Base) MCG/ACT inhaler, INHALE 2 PUFFS BY MOUTH EVERY 6 HOURS AS NEEDED FOR WHEEZING OR SHORTNESS OF BREATH, Disp: 3 Inhaler, Rfl: 3 .  AMBULATORY NON FORMULARY MEDICATION, Medication Name: Muscle and Joint Vanishing Scent Gel- Apply as needed to skin, Disp: , Rfl:  .  amLODipine (NORVASC) 5 MG tablet, Take 1 tablet (5 mg total) by mouth daily., Disp: 90 tablet, Rfl: 3 .  baclofen (LIORESAL) 10 MG tablet, Take 1 tablet (10 mg total) by mouth 3 (three) times daily., Disp: 90 tablet, Rfl: 6 .  Blood Glucose Monitoring Suppl (ACCU-CHEK AVIVA PLUS) w/Device KIT, 1 each by XX route as directed for up to 1 day, Disp: , Rfl:  .  budesonide (PULMICORT) 0.5 MG/2ML nebulizer solution, Inhale into the lungs. Take 2 mls by nebulizations once daily, Disp: , Rfl:  .  Calcium Carbonate-Vitamin D (CALCIUM-VITAMIN D3 PO), D3 500- calcium 630 mg Take 1 capsule by mouth once daily, Disp: , Rfl:  .  carvedilol (COREG) 3.125 MG tablet, Take 1 tablet by mouth 2 (two) times daily., Disp: , Rfl:  .  clopidogrel (PLAVIX) 75 MG tablet, Take 1 tablet (75 mg total) by mouth daily., Disp: 90 tablet, Rfl: 0 .  Dulaglutide 1.5 MG/0.5ML SOPN, Inject 1.5 mg into the skin once a week., Disp: 12 pen, Rfl: 3 .  DULoxetine (CYMBALTA) 20 MG capsule, Take 2 capsules (40 mg total) by mouth daily., Disp: 60 capsule, Rfl: 2 .  ezetimibe (ZETIA) 10 MG tablet, Take 1 tablet (10 mg total) by mouth daily., Disp: 90 tablet, Rfl: 0 .  fluticasone (FLONASE) 50 MCG/ACT nasal spray, Place 2 sprays into  both nostrils daily., Disp: 16 g, Rfl: 3 .  Fluticasone-Salmeterol (WIXELA INHUB) 100-50 MCG/DOSE AEPB, Inhale 1 puff into the lungs  2 (two) times daily., Disp: , Rfl:  .  gabapentin (NEURONTIN) 400 MG capsule, 400 mg qAM, 400 mg qPM, 800 mg qhs, Disp: 120 capsule, Rfl: 2 .  glucose blood (ACCU-CHEK AVIVA PLUS) test strip, Use 1 each (1 strip total) 4 (four) times daily for 90 days Use as instructed., Disp: , Rfl:  .  insulin aspart (NOVOLOG FLEXPEN) 100 UNIT/ML FlexPen, Inject into the skin., Disp: , Rfl:  .  Insulin Glargine, 2 Unit Dial, 300 UNIT/ML SOPN, Inject 120 Units into the skin daily. (Patient taking differently: Inject 136 Units into the skin daily. ), Disp: 15 mL, Rfl: 1 .  Insulin Pen Needle (EASY TOUCH PEN NEEDLES) 31G X 8 MM MISC, Use as directed in giving insulin. Dx: E11.29, Disp: 400 each, Rfl: 3 .  levETIRAcetam (KEPPRA) 500 MG tablet, Take 500 mg by mouth 2 (two) times daily., Disp: , Rfl:  .  losartan (COZAAR) 100 MG tablet, Take 1 tablet (100 mg total) by mouth daily., Disp: 90 tablet, Rfl: 3 .  mirabegron ER (MYRBETRIQ) 50 MG TB24 tablet, Take 1 tablet (50 mg total) by mouth daily., Disp: 90 tablet, Rfl: 1 .  ondansetron (ZOFRAN ODT) 4 MG disintegrating tablet, Take 1 tablet (4 mg total) by mouth every 8 (eight) hours as needed for nausea or vomiting., Disp: 20 tablet, Rfl: 1 .  ONETOUCH DELICA LANCETS 35T MISC, Use to check sugar three times daily as directed.Dx: E11.29; E11.40, Disp: 300 each, Rfl: 3 .  oxyCODONE-acetaminophen (PERCOCET) 5-325 MG tablet, Take 1 tablet by mouth every 4 (four) hours as needed for severe pain., Disp: 12 tablet, Rfl: 0 .  pantoprazole (PROTONIX) 40 MG tablet, Take 1 tablet (40 mg total) by mouth daily., Disp: 90 tablet, Rfl: 0 .  polyethylene glycol (MIRALAX / GLYCOLAX) packet, Take 17 g by mouth daily as needed for mild constipation., Disp: 100 each, Rfl: 3 .  potassium chloride SA (KLOR-CON M20) 20 MEQ tablet, Take 2 tablets (40 mEq total) by  mouth daily., Disp: 60 tablet, Rfl: 5 .  predniSONE (DELTASONE) 20 MG tablet, Take 1 tablet by mouth daily., Disp: , Rfl:  .  prochlorperazine (COMPAZINE) 10 MG tablet, Take 1 tablet (10 mg total) by mouth every 6 (six) hours as needed for nausea or vomiting., Disp: 30 tablet, Rfl: 0 .  simvastatin (ZOCOR) 20 MG tablet, Take 1 tablet (20 mg total) by mouth at bedtime., Disp: 90 tablet, Rfl: 0 .  spironolactone (ALDACTONE) 25 MG tablet, Take 1 tablet by mouth daily., Disp: , Rfl:  .  sucralfate (CARAFATE) 1 g tablet, Take 1 tablet (1 g total) by mouth 4 (four) times daily for 10 days., Disp: 40 tablet, Rfl: 0 .  cycloSPORINE (RESTASIS) 0.05 % ophthalmic emulsion, Place 2 drops into both eyes 2 (two) times daily., Disp: , Rfl:  .  fluorometholone (FML) 0.1 % ophthalmic suspension, Place 2 drops into both eyes 2 (two) times daily., Disp: , Rfl:   Allergies  Allergen Reactions  . Codeine Swelling and Other (See Comments)    Swelling and burning of mouth (inside)  . Lactose Intolerance (Gi) Nausea And Vomiting    With staff assistance, above reviewed with the patient today.   ROS: As per HPI, otherwise no specific complaints on a limited and focused system review   Objective  Virtual encounter, vitals not obtained.  Body mass index is 36.15 kg/m.  Physical Exam  Patient appears in NAD, pleasant HENT: Head: Normocephalic and atraumatic.  Breathing: Effort normal.  No respiratory distress. Speaking in complete sentences Neurological: Pt is alert,  Speech is normal.  Psychiatric: Patient has a normal mood and affect, behavior is normal. Very appropriate with conversation, judgment and thought content normal.   No results found for this or any previous visit (from the past 72 hour(s)).  PHQ2/9: Depression screen [facility] 2/9 04/18/2019 03/01/2019 02/21/2019 12/06/2018 11/28/2018  Decreased Interest 0 0 0 0 0  Down, Depressed, Hopeless 0 0 0 0 0  PHQ - 2 Score 0 0 0 0 0  Altered sleeping 1 1 -  - 0  Tired, decreased energy 1 1 - - 0  Change in appetite 0 0 - - 0  Feeling bad or failure about yourself  0 0 - - 0  Trouble concentrating 0 0 - - 0  Moving slowly or fidgety/restless 0 0 - - 0  Suicidal thoughts 0 0 - - 0  PHQ-9 Score 2 2 - - 0  Difficult doing work/chores Somewhat difficult Not difficult at all - - Not difficult at all  Some recent data might be hidden   PHQ-2/9 Result reviewed   Fall Risk: Fall Risk  04/18/2019 03/01/2019 02/21/2019 12/06/2018 11/28/2018  Falls in the past year? 1 0 _0 Number falls in past yr: _1 Injury with Fall? 0 0 0 0 0  Risk for fall due to : - - History of fall(s);Impaired balance/gait;Impaired vision - -  Risk for fall due to: Comment - - - - -  Follow up - - Falls evaluation completed - Falls evaluation completed   She had labs done at that visit which included a an unremarkable CBC, with a comp panel showing an elevated glucose of 165 with the remainder of the panel normal.  Assessment & Plan  1. Mixed hyperlipidemia Continue the medications and they were refilled We will recheck labs again after the cardiac procedure is completed. - ezetimibe (ZETIA) 10 MG tablet; Take 1 tablet (10 mg total) by mouth daily.  Dispense: 90 tablet; Refill: 0 - simvastatin (ZOCOR) 20 MG tablet; Take 1 tablet (20 mg total) by mouth at bedtime.  Dispense: 90 tablet; Refill: 0  2. Non-seasonal allergic rhinitis, unspecified trigger Refill the Flonase for her today. - fluticasone (FLONASE) 50 MCG/ACT nasal spray; Place 2 sprays into both nostrils daily.  Dispense: 16 g; Refill: 3  3. Overactive bladder Refill the medication for this today as well.  She notes it is helpful. - mirabegron ER (MYRBETRIQ) 50 MG TB24 tablet; Take 1 tablet (50 mg total) by mouth daily.  Dispense: 90 tablet; Refill: 1  4. Non-intractable vomiting with nausea, unspecified vomiting type Refill the Zofran, and will not need to have Compazine to use as needed for nausea in  addition.  Discussed this with her today and she was in agreement. - ondansetron (ZOFRAN ODT) 4 MG disintegrating tablet; Take 1 tablet (4 mg total) by mouth every 8 (eight) hours as needed for nausea or vomiting.  Dispense: 20 tablet; Refill: 1  5. Essential hypertension Continue current management, and did note concerns with her blood pressures being slightly higher than desired in the recent past.  She did have her machine checked at home, stating she took it to her last cardiology visit and was checked and was reading the same as they were getting.  Cardiology did not want to make any changes in her management of her blood pressure until after the catheterization procedure was completed. She will  continue to monitor blood pressures, and emphasized if her is consistently in the 100s, or her top number is any higher consistently, she should call the cardiologist as I do feel they may want to increase her medicine some to better control her blood pressure even before the planned procedure.  She was understanding of that recommendation.  6. Type 2 diabetes, uncontrolled, with renal manifestation ([place]) She stated she has an appointment to follow-up with endocrinology next month, and emphasized the importance of continuing to follow with them and keeping her blood sugars controlled.  7. Diabetic polyneuropathy associated with type 2 diabetes mellitus ([place]) Encouraged to follow-up with neurology again. Continue the gabapentin presently. 8. Encounter for medication monitoring   9. Mild persistent asthma without complication Continuing to follow with pulmonary.  10. OSA (obstructive sleep apnea) Continue the CPAP  11. Obesity (BMI 30-39.9) Continue with weight management and monitoring.   I discussed the assessment and treatment plan with the patient. The patient was provided an opportunity to ask questions and all were answered. The patient agreed with the plan and demonstrated an understanding  of the instructions.  The patient was advised to call back or seek an in-person evaluation if the symptoms worsen or if the condition fails to improve as anticipated.  I provided 25 minutes of non-face-to-face time during this encounter that included discussing at length patient's sx/history, pertinent pmhx, medications, treatment and follow up plan. This time also included the necessary documentation, orders, and chart review.

## 2019-04-17 NOTE — Chronic Care Management (AMB) (Signed)
  Chronic Care Management   Note   Name: [name] MRN: [id number] DOB: 08-29-1965   Ms. Batie was recently referred for chronic care management and care coordination. She was contacted again today. The outreach was successful however she prefers that I contact her daughter/health care power of attorney, [name]. Reports that her primary concern is receiving the Covid-19 vaccination. She requested assistance with locating a vaccination site. Requested that I contact [name] with the information and for any further health related discussions.  Attempted to contact [name] as requested. She was not available at the time of the call. A HIPAA compliant voice message was left requesting a return call.  Follow up plan: -Reviewed available Covid-19 vaccination sites in [place]. Several appointments are available for initial dose. -Will attempt to follow up with [name] next week to discuss sites and assist with scheduling.   [facility] [phone]

## 2019-04-18 ENCOUNTER — Encounter: Payer: Self-pay | Admitting: Internal Medicine

## 2019-04-18 ENCOUNTER — Ambulatory Visit (INDEPENDENT_AMBULATORY_CARE_PROVIDER_SITE_OTHER): Payer: Medicare HMO | Admitting: Internal Medicine

## 2019-04-18 VITALS — BP 174/100 | Ht 66.0 in | Wt 224.0 lb

## 2019-04-18 DIAGNOSIS — R112 Nausea with vomiting, unspecified: Secondary | ICD-10-CM

## 2019-04-18 DIAGNOSIS — E669 Obesity, unspecified: Secondary | ICD-10-CM

## 2019-04-18 DIAGNOSIS — J3089 Other allergic rhinitis: Secondary | ICD-10-CM | POA: Diagnosis not present

## 2019-04-18 DIAGNOSIS — N3281 Overactive bladder: Secondary | ICD-10-CM

## 2019-04-18 DIAGNOSIS — E1129 Type 2 diabetes mellitus with other diabetic kidney complication: Secondary | ICD-10-CM | POA: Diagnosis not present

## 2019-04-18 DIAGNOSIS — J4530 Mild persistent asthma, uncomplicated: Secondary | ICD-10-CM

## 2019-04-18 DIAGNOSIS — E1165 Type 2 diabetes mellitus with hyperglycemia: Secondary | ICD-10-CM

## 2019-04-18 DIAGNOSIS — IMO0002 Reserved for concepts with insufficient information to code with codable children: Secondary | ICD-10-CM

## 2019-04-18 DIAGNOSIS — E782 Mixed hyperlipidemia: Secondary | ICD-10-CM

## 2019-04-18 DIAGNOSIS — T466X5A Adverse effect of antihyperlipidemic and antiarteriosclerotic drugs, initial encounter: Secondary | ICD-10-CM

## 2019-04-18 DIAGNOSIS — Z5181 Encounter for therapeutic drug level monitoring: Secondary | ICD-10-CM

## 2019-04-18 DIAGNOSIS — I10 Essential (primary) hypertension: Secondary | ICD-10-CM

## 2019-04-18 DIAGNOSIS — E1142 Type 2 diabetes mellitus with diabetic polyneuropathy: Secondary | ICD-10-CM | POA: Diagnosis not present

## 2019-04-18 DIAGNOSIS — M791 Myalgia, unspecified site: Secondary | ICD-10-CM | POA: Diagnosis not present

## 2019-04-18 DIAGNOSIS — G4733 Obstructive sleep apnea (adult) (pediatric): Secondary | ICD-10-CM

## 2019-04-18 MED ORDER — MIRABEGRON ER 50 MG PO TB24: 50.0000 mg | ORAL_TABLET | Freq: Every day | ORAL | 1 refills | Status: DC

## 2019-04-18 MED ORDER — EZETIMIBE 10 MG PO TABS: 10.0000 mg | ORAL_TABLET | Freq: Every day | ORAL | 3 refills | Status: DC

## 2019-04-18 MED ORDER — SIMVASTATIN 20 MG PO TABS: 20.0000 mg | ORAL_TABLET | Freq: Every day | ORAL | 3 refills | Status: DC

## 2019-04-18 MED ORDER — FLUTICASONE PROPIONATE 50 MCG/ACT NA SUSP: 2.0000 | Freq: Every day | NASAL | 3 refills | Status: DC

## 2019-04-18 MED ORDER — ONDANSETRON 4 MG PO TBDP: 4.0000 mg | ORAL_TABLET | Freq: Three times a day (TID) | ORAL | 1 refills | Status: DC | PRN

## 2019-04-19 ENCOUNTER — Other Ambulatory Visit: Payer: Self-pay

## 2019-04-19 ENCOUNTER — Telehealth: Payer: Self-pay | Admitting: Student in an Organized Health Care Education/Training Program

## 2019-04-19 ENCOUNTER — Telehealth: Payer: Self-pay | Admitting: *Deleted

## 2019-04-19 ENCOUNTER — Encounter: Payer: Self-pay | Admitting: Student in an Organized Health Care Education/Training Program

## 2019-04-19 ENCOUNTER — Ambulatory Visit
Payer: Medicare HMO | Attending: Student in an Organized Health Care Education/Training Program | Admitting: Student in an Organized Health Care Education/Training Program

## 2019-04-19 DIAGNOSIS — E1142 Type 2 diabetes mellitus with diabetic polyneuropathy: Secondary | ICD-10-CM

## 2019-04-19 DIAGNOSIS — G890 Central pain syndrome: Secondary | ICD-10-CM | POA: Insufficient documentation

## 2019-04-19 DIAGNOSIS — M791 Myalgia, unspecified site: Secondary | ICD-10-CM | POA: Insufficient documentation

## 2019-04-19 DIAGNOSIS — G894 Chronic pain syndrome: Secondary | ICD-10-CM | POA: Diagnosis not present

## 2019-04-19 MED ORDER — DULOXETINE HCL 20 MG PO CPEP: 40.0000 mg | ORAL_CAPSULE | Freq: Every day | ORAL | 2 refills | Status: DC

## 2019-04-19 MED ORDER — GABAPENTIN 400 MG PO CAPS: ORAL_CAPSULE | ORAL | 2 refills | Status: DC

## 2019-04-19 MED ORDER — OXYCODONE-ACETAMINOPHEN 5-325 MG PO TABS: 1.0000 | ORAL_TABLET | Freq: Two times a day (BID) | ORAL | 0 refills | Status: DC | PRN

## 2019-04-19 NOTE — Telephone Encounter (Signed)
In Patient appt changed to Virtual per Dr. Holley Raring

## 2019-04-19 NOTE — Telephone Encounter (Signed)
Patient called to get script and was told insurance would only fil for 7 days. The full 30 script will need PA. Please let patient know when she can get scripts. She did not pick up 7 days of meds

## 2019-04-19 NOTE — Telephone Encounter (Signed)
Attempted to call for pre appointment review of allergies/meds. Message left. 

## 2019-04-19 NOTE — Progress Notes (Signed)
Patient: [name]  Service Category: E/M  Provider: Gillis Santa, MD  DOB: 02/14/1965  DOS: 04/19/2019  Location: Office  MRN: [id number]  Setting: Ambulatory outpatient  Referring Provider: Hubbard Hartshorn, [name]  Type: Established Patient  Specialty: Interventional Pain Management  PCP: Karen Hartshorn, [name]  Location: Home  Delivery: TeleHealth     Virtual Encounter - Pain Management PROVIDER NOTE: Information contained herein reflects review and annotations entered in association with encounter. Interpretation of such information and data should be left to medically-trained personnel. Information provided to patient can be located elsewhere in the medical record under "Patient Instructions". Document created using STT-dictation technology, any transcriptional errors that may result from process are unintentional.    Contact & Pharmacy Preferred: [phone] Home: [phone] (home) Mobile: [phone] (mobile) E-mail: [email or web address]  [id number] [place]: [phone]: [phone]  Pre-screening  [name] offered "in-person" vs "virtual" encounter. She indicated preferring virtual for this encounter.   Reason COVID-19*  Social distancing based on CDC and AMA recommendations.   I contacted Karen Eveneron 04/19/2019 via telephone.      I clearly identified myself as BGillis Santa MD. I verified that I was speaking with the correct person using two identifiers (Name: [name] and date of birth: [birth year].  This visit was completed via telephone due to the restrictions of the COVID-19 pandemic. All issues as above were discussed and addressed but no physical exam was performed. If it was felt that the patient should be evaluated in the office, they were directed there. The patient verbally consented to this visit. Patient was unable to complete an audio/visual  visit due to Technical difficulties and/or Lack of internet. Due to the catastrophic nature of the COVID-19 pandemic, this visit was done through audio contact only.  Location of the patient: home address (see Epic for details)  Location of the provider: office Consent I sought verbal advanced consent from [name] virtual visit interactions. I informed [name] possible security and privacy concerns, risks, and limitations associated with providing "not-in-person" medical evaluation and management services. I also informed [name] the availability of "in-person" appointments. Finally, I informed her that there would be a charge for the virtual visit and that she could be  personally, fully or partially, financially responsible for it. Ms. MTerranceexpressed understanding and agreed to proceed.   Historic Elements   Ms. Karen Hulickis a 54y.o. year old, female patient evaluated today after her last contact with our practice on 04/19/2019. [name] has a past medical history of Anxiety and depression (09/13/2006), Arthritis, Asthma, COPD (chronic obstructive pulmonary disease) ([place], Depression, Diabetes mellitus, Diabetic neuropathy, painful ([place], Diabetic retinopathy ([place], Diastolic dysfunction, Dilated aortic root ([place], Dizziness, Epilepsy idiopathic petit mal ([place], GERD (gastroesophageal reflux disease), Glaucoma, Headache(784.0), Heart murmur, History of stress test, blood clots, Hyperlipidemia, Hypertension, Legally blind, MIGRAINE HEADACHE (09/14/2006), Myocardial infarction ([place], Nephrolithiasis, Pain (09/23/11), Pneumonia, Pyelonephritis, Seizures ([place], Sleep apnea, Stress incontinence, and Stroke ([place]. She also  has a past surgical history that includes kidney stone removal; Brain hematoma evacuation; Shunt removal; Retinal detachment surgery (Left, 1990); Vaginal hysterectomy (1996); Incisional hernia repair (05/05/2011); Mass excision (05/05/2011); PCNL;  Cystoscopy/retrograde/ureteroscopy (08/24/2011); Cystoscopy with ureteroscopy (09/24/2011); and Cystoscopy w/ retrogrades (09/24/2011). Ms. MMalayhas a current medication list which includes the following prescription(s): albuterol, albuterol, AMBULATORY NON FORMULARY MEDICATION, amlodipine, baclofen,  accu-chek aviva plus, budesonide, calcium carbonate-vitamin d, carvedilol, clopidogrel, cyclosporine, dulaglutide, duloxetine, epinephrine, ezetimibe, fluorometholone, fluticasone, fluticasone-salmeterol, gabapentin, accu-chek aviva plus, insulin aspart, insulin glargine (2 unit dial), insulin pen needle, levetiracetam, losartan, mirabegron er, ondansetron, onetouch delica lancets 32D, oxycodone-acetaminophen, pantoprazole, polyethylene glycol, potassium chloride sa, prednisone, simvastatin, spironolactone, dupixent, prochlorperazine, and sucralfate. She  reports that she quit smoking about 32 years ago. Her smoking use included cigarettes. She has a 15.00 pack-year smoking history. She has never used smokeless tobacco. She reports that she does not drink alcohol or use drugs. [name] is allergic to codeine and lactose intolerance (gi).   HPI  Today, she is being contacted for medication management.    Patient's pain is at baseline.  Patient continues multimodal pain regimen as prescribed.  States that it provides pain relief and improvement in functional status.  Finds benefit with Cymbalta and gabapentin as prescribed.  Is requesting refill of Percocet.  She states that she only takes this medication as needed.  UDS is appropriate.  Of note her positive result for morphine is appropriate given that the patient was in the ED the day prior and did receive an injection of morphine for abdominal pain.  Pharmacotherapy Assessment  Analgesic: Percocet 5 mg daily as needed, patient takes very seldomly. Monitoring: [name] PMP: PDMP reviewed during this encounter.       Pharmacotherapy: No side-effects or adverse  reactions reported. Compliance: No problems identified. Effectiveness: Clinically acceptable. Plan: Refer to "POC".  UDS:  Summary  Date Value Ref Range Status  12/06/2018 Note  Final    Comment:    ==================================================================== Compliance Drug Analysis, Ur ==================================================================== Test                             Result       Flag       Units Drug Present and Declared for Prescription Verification   Oxycodone                      62           EXPECTED   ng/mg creat   Oxymorphone                    73           EXPECTED   ng/mg creat   Noroxycodone                   322          EXPECTED   ng/mg creat   Noroxymorphone                 43           EXPECTED   ng/mg creat    Sources of oxycodone are scheduled prescription medications.    Oxymorphone, noroxycodone, and noroxymorphone are expected    metabolites of oxycodone. Oxymorphone is also available as a    scheduled prescription medication.   Gabapentin                     PRESENT      EXPECTED   Levetiracetam                  PRESENT      EXPECTED   Baclofen  PRESENT      EXPECTED   Acetaminophen                  PRESENT      EXPECTED Drug Present not Declared for Prescription Verification   Morphine                       227          UNEXPECTED ng/mg creat    Potential sources of morphine include administration of codeine or    morphine, use of heroin, or ingestion of poppy seeds. Drug Absent but Declared for Prescription Verification   Butalbital                     Not Detected UNEXPECTED   Duloxetine                     Not Detected UNEXPECTED   Prochlorperazine               Not Detected UNEXPECTED ==================================================================== Test                      Result    Flag   Units      Ref Range   Creatinine              157              mg/dL       >=20 ==================================================================== Declared Medications:  The flagging and interpretation on this report are based on the  following declared medications.  Unexpected results may arise from  inaccuracies in the declared medications.  **Note: The testing scope of this panel includes these medications:  Baclofen  Butalbital (Fioricet)  Duloxetine (Cymbalta)  Gabapentin (Neurontin)  Levetiracetam (Keppra)  Oxycodone (Percocet)  Prochlorperazine (Compazine)  **Note: The testing scope of this panel does not include small to  moderate amounts of these reported medications:  Acetaminophen (Fioricet)  Acetaminophen (Percocet)  **Note: The testing scope of this panel does not include the  following reported medications:  Albuterol (Ventolin HFA)  Amlodipine  Caffeine (Fioricet)  Calcium  Cimetidine (Tagamet)  Clopidogrel (Plavix)  Cyclosporine (Restasis)  Dulaglutide  Ezetimibe (Zetia)  Fluorometholone  Fluticasone (Advair)  Indeterminate Medication  Insulin (NovoLog)  Losartan (Cozaar)  Mirabegron (Myrbetriq)  Ondansetron (Zofran)  Pantoprazole (Protonix)  Polyethylene Glycol (MiraLAX)  Potassium (Klor-Con)  Prednisone (Deltasone)  Salmeterol (Advair)  Simvastatin (Zocor)  Sucralfate (Carafate)  Sulfamethoxazole (Bactrim)  Terconazole  Trimethoprim (Bactrim)  Vitamin D ==================================================================== For clinical consultation, please call [phone]. ====================================================================   UDS appropriate, patient received morphine injection 1 day prior to ED.  Laboratory Chemistry Profile   Renal Lab Results  Component Value Date   BUN 21 (H) 12/05/2018   CREATININE 0.96 12/05/2018   LABCREA 48 69/67/8938   BCR NOT APPLICABLE 11/04/5100   GFR 85.44 05/18/2017   GFRAA >60 12/05/2018   GFRNONAA >60 12/05/2018     Hepatic Lab Results  Component Value  Date   AST 11 (L) 12/05/2018   ALT 14 12/05/2018   ALBUMIN 4.1 12/05/2018   ALKPHOS 81 12/05/2018   LIPASE 32 12/05/2018     Electrolytes Lab Results  Component Value Date   NA 136 12/05/2018   K 4.4 12/05/2018   CL 104 12/05/2018   CALCIUM 9.1 12/05/2018   MG 2.1 10/12/2018     Bone Lab Results  Component Value Date   VD25OH  26 (L) 01/24/2018     Inflammation (CRP: Acute Phase) (ESR: Chronic Phase) Lab Results  Component Value Date   CRP 2.5 11/14/2018   ESRSEDRATE 29 11/14/2018   LATICACIDVEN 0.9 02/22/2014       Note: Above Lab results reviewed.  Imaging  CT TEMPORAL BONES WO CONTRAST CLINICAL DATA:  54 year old female with fluid-filled left mastoid and petrous apex air cells on MRI last month performed for dizziness, recurrent syncope.  EXAM: CT TEMPORAL BONES WITHOUT CONTRAST  TECHNIQUE: Axial and coronal plane CT imaging of the petrous temporal bones was performed with thin-collimation image reconstruction. No intravenous contrast was administered. Multiplanar CT image reconstructions were also generated.  COMPARISON:  Brain MRI 11/28/2018.  FINDINGS: Visible noncontrast brain parenchyma appears stable and negative. Incidental widespread dural calcifications (normal variant). Visualized paranasal sinuses are stable and clear. Disconjugate gaze, but otherwise negative visible orbit and scalp soft tissues.  Negative visible noncontrast deep soft tissue spaces of the face. Visible pharyngeal soft tissue contours appear symmetric.  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 In general skull base bone mineralization appears normal.  LEFT TEMPORAL BONE:  Left external auditory canal is clear. Normal left tympanic membrane.  The left tympanic cavity is well pneumatized. The left ossicles appear intact and normally aligned. Scutum is intact.  The left mastoid antrum is clear. Moderate scattered left mastoid air cell opacification appears  stable to the prior MRI, intermixed with pneumatized air cells (series 9, image 85 today). The left petrous apex is mostly pneumatized. No mastoid coalescence is identified. Sigmoid plate intact.  Left IAC, cochlea, vestibule and vestibular aqueduct are within normal limits. Normal left semicircular canals. Course of the left 7th nerve within normal limits.  RIGHT TEMPORAL BONE:  Normal right external auditory canal. Normal right tympanic membrane. Right tympanic cavity is clear. Ossicles appear intact and normally aligned.  Right mastoid antrum and air cells are well pneumatized. Pneumatized right petrous apex air cells also.  Right IAC, cochlea, vestibule and vestibular aqueduct are within normal limits. Normal right semicircular canals, course of the right 7th nerve.  IMPRESSION: 1. Moderate left mastoid effusion appears stable since November and with no coalescence of air cells or complicating features identified. The left petrous apex remains mostly pneumatized, and the left tympanic cavity is clear and negative. Burtis Junes this is a postinflammatory effusion.  2. Negative right temporal bone.  Electronically Signed   By: Genevie Ann M.D.   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 Diagnoses of Diabetic polyneuropathy associated with type 2 diabetes mellitus ([place]), Myalgia, and Chronic pain syndrome were also pertinent to this visit.  Plan of Care   [name] has a current medication list which includes the following long-term medication(s): albuterol, albuterol, amlodipine, budesonide, calcium carbonate-vitamin d, carvedilol, duloxetine, ezetimibe, fluticasone, fluticasone-salmeterol, gabapentin, insulin glargine (2 unit dial), levetiracetam, losartan, mirabegron er, pantoprazole, potassium chloride sa, simvastatin, spironolactone, prochlorperazine, and sucralfate.  Pharmacotherapy (Medications Ordered): Meds  ordered this encounter  Medications  . DULoxetine (CYMBALTA) 20 MG capsule    Sig: Take 2 capsules (40 mg total) by mouth daily.    Dispense:  60 capsule    Refill:  2  . gabapentin (NEURONTIN) 400 MG capsule    Sig: 400 mg qAM, 400 mg qPM, 800 mg qhs    Dispense:  120 capsule    Refill:  2  . oxyCODONE-acetaminophen (PERCOCET) 5-325 MG tablet    Sig: Take 1 tablet  by mouth every 12 (twelve) hours as needed for severe pain.    Dispense:  30 tablet    Refill:  0   Follow-up plan:   Return in about 3 months (around 07/19/2019) for Medication Management.    Recent Visits No visits were found meeting these conditions.  Showing recent visits within past 90 days and meeting all other requirements   Today's Visits Date Type Provider Dept  04/19/19 Office Visit Karen Santa, MD [facility]  Showing today's visits and meeting all other requirements   Future Appointments No visits were found meeting these conditions.  Showing future appointments within next 90 days and meeting all other requirements   I discussed the assessment and treatment plan with the patient. The patient was provided an opportunity to ask questions and all were answered. The patient agreed with the plan and demonstrated an understanding of the instructions.  Patient advised to call back or seek an in-person evaluation if the symptoms or condition worsens.  Duration of encounter: 25 minutes.  Note by: Karen Santa, MD Date: 04/19/2019; Time: 12:19 PM

## 2019-04-20 NOTE — Telephone Encounter (Signed)
Patient notified Oxy/Acet has been approved.

## 2019-04-22 ENCOUNTER — Encounter: Payer: Self-pay | Admitting: Family Medicine

## 2019-04-26 ENCOUNTER — Telehealth: Payer: Self-pay

## 2019-04-26 DIAGNOSIS — J4550 Severe persistent asthma, uncomplicated: Secondary | ICD-10-CM | POA: Diagnosis not present

## 2019-04-27 ENCOUNTER — Other Ambulatory Visit: Payer: Self-pay

## 2019-04-27 ENCOUNTER — Inpatient Hospital Stay
Admission: EM | Admit: 2019-04-27 | Discharge: 2019-04-29 | DRG: 287 | Disposition: A | Discharge status: Home / Self Care | Payer: Medicare HMO | Attending: Internal Medicine | Admitting: Internal Medicine

## 2019-04-27 ENCOUNTER — Emergency Department: Payer: Medicare HMO

## 2019-04-27 ENCOUNTER — Encounter: Payer: Self-pay | Admitting: Emergency Medicine

## 2019-04-27 DIAGNOSIS — I6930 Unspecified sequelae of cerebral infarction: Secondary | ICD-10-CM

## 2019-04-27 DIAGNOSIS — R072 Precordial pain: Secondary | ICD-10-CM | POA: Diagnosis not present

## 2019-04-27 DIAGNOSIS — Z23 Encounter for immunization: Secondary | ICD-10-CM

## 2019-04-27 DIAGNOSIS — G40A09 Absence epileptic syndrome, not intractable, without status epilepticus: Secondary | ICD-10-CM

## 2019-04-27 DIAGNOSIS — I200 Unstable angina: Secondary | ICD-10-CM

## 2019-04-27 DIAGNOSIS — Z7951 Long term (current) use of inhaled steroids: Secondary | ICD-10-CM

## 2019-04-27 DIAGNOSIS — Z6836 Body mass index (BMI) 36.0-36.9, adult: Secondary | ICD-10-CM

## 2019-04-27 DIAGNOSIS — I25119 Atherosclerotic heart disease of native coronary artery with unspecified angina pectoris: Secondary | ICD-10-CM | POA: Diagnosis not present

## 2019-04-27 DIAGNOSIS — E669 Obesity, unspecified: Secondary | ICD-10-CM | POA: Diagnosis present

## 2019-04-27 DIAGNOSIS — J449 Chronic obstructive pulmonary disease, unspecified: Secondary | ICD-10-CM | POA: Diagnosis present

## 2019-04-27 DIAGNOSIS — G40909 Epilepsy, unspecified, not intractable, without status epilepticus: Secondary | ICD-10-CM

## 2019-04-27 DIAGNOSIS — Z7902 Long term (current) use of antithrombotics/antiplatelets: Secondary | ICD-10-CM

## 2019-04-27 DIAGNOSIS — Z8249 Family history of ischemic heart disease and other diseases of the circulatory system: Secondary | ICD-10-CM

## 2019-04-27 DIAGNOSIS — Z87891 Personal history of nicotine dependence: Secondary | ICD-10-CM | POA: Diagnosis not present

## 2019-04-27 DIAGNOSIS — R569 Unspecified convulsions: Secondary | ICD-10-CM

## 2019-04-27 DIAGNOSIS — I10 Essential (primary) hypertension: Secondary | ICD-10-CM | POA: Diagnosis present

## 2019-04-27 DIAGNOSIS — R079 Chest pain, unspecified: Secondary | ICD-10-CM | POA: Diagnosis present

## 2019-04-27 DIAGNOSIS — E1140 Type 2 diabetes mellitus with diabetic neuropathy, unspecified: Secondary | ICD-10-CM | POA: Diagnosis present

## 2019-04-27 DIAGNOSIS — Z794 Long term (current) use of insulin: Secondary | ICD-10-CM

## 2019-04-27 DIAGNOSIS — E785 Hyperlipidemia, unspecified: Secondary | ICD-10-CM | POA: Diagnosis present

## 2019-04-27 DIAGNOSIS — F418 Other specified anxiety disorders: Secondary | ICD-10-CM | POA: Diagnosis present

## 2019-04-27 DIAGNOSIS — E11319 Type 2 diabetes mellitus with unspecified diabetic retinopathy without macular edema: Secondary | ICD-10-CM | POA: Diagnosis not present

## 2019-04-27 DIAGNOSIS — R0602 Shortness of breath: Secondary | ICD-10-CM | POA: Diagnosis not present

## 2019-04-27 DIAGNOSIS — Z833 Family history of diabetes mellitus: Secondary | ICD-10-CM | POA: Diagnosis not present

## 2019-04-27 DIAGNOSIS — R9439 Abnormal result of other cardiovascular function study: Secondary | ICD-10-CM | POA: Diagnosis not present

## 2019-04-27 DIAGNOSIS — I2510 Atherosclerotic heart disease of native coronary artery without angina pectoris: Secondary | ICD-10-CM | POA: Diagnosis present

## 2019-04-27 DIAGNOSIS — Z79899 Other long term (current) drug therapy: Secondary | ICD-10-CM

## 2019-04-27 DIAGNOSIS — Z86718 Personal history of other venous thrombosis and embolism: Secondary | ICD-10-CM | POA: Diagnosis not present

## 2019-04-27 DIAGNOSIS — K219 Gastro-esophageal reflux disease without esophagitis: Secondary | ICD-10-CM | POA: Diagnosis present

## 2019-04-27 DIAGNOSIS — Z20822 Contact with and (suspected) exposure to covid-19: Secondary | ICD-10-CM | POA: Diagnosis present

## 2019-04-27 DIAGNOSIS — I209 Angina pectoris, unspecified: Secondary | ICD-10-CM | POA: Diagnosis not present

## 2019-04-27 DIAGNOSIS — E1165 Type 2 diabetes mellitus with hyperglycemia: Secondary | ICD-10-CM | POA: Diagnosis present

## 2019-04-27 DIAGNOSIS — E119 Type 2 diabetes mellitus without complications: Secondary | ICD-10-CM | POA: Diagnosis not present

## 2019-04-27 DIAGNOSIS — M79602 Pain in left arm: Secondary | ICD-10-CM | POA: Diagnosis present

## 2019-04-27 DIAGNOSIS — G4733 Obstructive sleep apnea (adult) (pediatric): Secondary | ICD-10-CM | POA: Diagnosis present

## 2019-04-27 DIAGNOSIS — Z7952 Long term (current) use of systemic steroids: Secondary | ICD-10-CM | POA: Diagnosis not present

## 2019-04-27 DIAGNOSIS — Z8673 Personal history of transient ischemic attack (TIA), and cerebral infarction without residual deficits: Secondary | ICD-10-CM

## 2019-04-27 DIAGNOSIS — E782 Mixed hyperlipidemia: Secondary | ICD-10-CM | POA: Diagnosis present

## 2019-04-27 DIAGNOSIS — S8002XA Contusion of left knee, initial encounter: Secondary | ICD-10-CM | POA: Diagnosis not present

## 2019-04-27 LAB — CBC
HCT: 39 % (ref 36.0–46.0)
Hemoglobin: 12.2 g/dL (ref 12.0–15.0)
MCH: 29.8 pg (ref 26.0–34.0)
MCHC: 31.3 g/dL (ref 30.0–36.0)
MCV: 95.4 fL (ref 80.0–100.0)
Platelets: 242 10*3/uL (ref 150–400)
RBC: 4.09 MIL/uL (ref 3.87–5.11)
RDW: 13.8 % (ref 11.5–15.5)
WBC: 5.1 10*3/uL (ref 4.0–10.5)
nRBC: 0 % (ref 0.0–0.2)

## 2019-04-27 LAB — URINE DRUG SCREEN, QUALITATIVE (ARMC ONLY)
Amphetamines, Ur Screen: NOT DETECTED
Barbiturates, Ur Screen: NOT DETECTED
Benzodiazepine, Ur Scrn: NOT DETECTED
Cannabinoid 50 Ng, Ur ~~LOC~~: NOT DETECTED
Cocaine Metabolite,Ur ~~LOC~~: NOT DETECTED
MDMA (Ecstasy)Ur Screen: NOT DETECTED
Methadone Scn, Ur: NOT DETECTED
Opiate, Ur Screen: NOT DETECTED
Phencyclidine (PCP) Ur S: NOT DETECTED
Tricyclic, Ur Screen: NOT DETECTED

## 2019-04-27 LAB — BASIC METABOLIC PANEL
Anion gap: 8 (ref 5–15)
BUN: 13 mg/dL (ref 6–20)
CO2: 28 mmol/L (ref 22–32)
Calcium: 9.1 mg/dL (ref 8.9–10.3)
Chloride: 104 mmol/L (ref 98–111)
Creatinine, Ser: 0.94 mg/dL (ref 0.44–1.00)
GFR calc Af Amer: >60 mL/min (ref >60)
GFR calc non Af Amer: >60 mL/min (ref >60)
Glucose, Bld: 197 mg/dL — ABNORMAL HIGH (ref 70–99)
Potassium: 4 mmol/L (ref 3.5–5.1)
Sodium: 140 mmol/L (ref 135–145)

## 2019-04-27 LAB — GLUCOSE, CAPILLARY
Glucose-Capillary: 129 mg/dL — ABNORMAL HIGH (ref 70–99)
Glucose-Capillary: 173 mg/dL — ABNORMAL HIGH (ref 70–99)

## 2019-04-27 LAB — RESPIRATORY PANEL BY RT PCR (FLU A&B, COVID)
Influenza A by PCR: NEGATIVE
Influenza B by PCR: NEGATIVE
SARS Coronavirus 2 by RT PCR: NEGATIVE

## 2019-04-27 LAB — PROTIME-INR
INR: 1 (ref 0.8–1.2)
Prothrombin Time: 13 seconds (ref 11.4–15.2)

## 2019-04-27 LAB — HIV ANTIBODY (ROUTINE TESTING W REFLEX): HIV Screen 4th Generation wRfx: NONREACTIVE

## 2019-04-27 LAB — TROPONIN I (HIGH SENSITIVITY)
Troponin I (High Sensitivity): 10 ng/L (ref <18)
Troponin I (High Sensitivity): 7 ng/L (ref <18)
Troponin I (High Sensitivity): 9 ng/L (ref <18)
Troponin I (High Sensitivity): 8 ng/L (ref <18)

## 2019-04-27 LAB — HEPARIN LEVEL (UNFRACTIONATED): Heparin Unfractionated: 0.25 IU/mL — ABNORMAL LOW (ref 0.30–0.70)

## 2019-04-27 LAB — APTT: aPTT: 27 seconds (ref 24–36)

## 2019-04-27 MED ORDER — HEPARIN BOLUS VIA INFUSION
4000.0000 [IU] | Freq: Once | INTRAVENOUS | Status: AC
  Administered 2019-04-27: 12:00:00 4000 [IU] via INTRAVENOUS
  Filled 2019-04-27: qty 4000

## 2019-04-27 MED ORDER — EPINEPHRINE 0.3 MG/0.3ML IJ SOAJ
0.3000 mg | INTRAMUSCULAR | Status: DC | PRN
  Filled 2019-04-27: qty 0.3

## 2019-04-27 MED ORDER — HYDRALAZINE HCL 20 MG/ML IJ SOLN
5.0000 mg | INTRAMUSCULAR | Status: DC | PRN
  Administered 2019-04-27 (×3): 5 mg via INTRAVENOUS
  Filled 2019-04-27 (×3): qty 1

## 2019-04-27 MED ORDER — GABAPENTIN 400 MG PO CAPS
400.0000 mg | ORAL_CAPSULE | Freq: Two times a day (BID) | ORAL | Status: DC
  Administered 2019-04-28 – 2019-04-29 (×3): 400 mg via ORAL
  Filled 2019-04-27 (×3): qty 1

## 2019-04-27 MED ORDER — ASPIRIN 81 MG PO CHEW
81.0000 mg | CHEWABLE_TABLET | ORAL | Status: AC
  Administered 2019-04-28: 81 mg via ORAL
  Filled 2019-04-27: qty 1

## 2019-04-27 MED ORDER — MORPHINE SULFATE (PF) 2 MG/ML IV SOLN
2.0000 mg | INTRAVENOUS | Status: DC | PRN
  Administered 2019-04-27 – 2019-04-28 (×6): 2 mg via INTRAVENOUS
  Filled 2019-04-27 (×5): qty 1

## 2019-04-27 MED ORDER — SODIUM CHLORIDE 0.9% FLUSH: 3.0000 mL | INTRAVENOUS | Status: DC | PRN

## 2019-04-27 MED ORDER — HEPARIN (PORCINE) 25000 UT/250ML-% IV SOLN
1450.0000 [IU]/h | INTRAVENOUS | Status: DC
  Administered 2019-04-27: 12:00:00 1150 [IU]/h via INTRAVENOUS
  Administered 2019-04-28: 05:00:00 1300 [IU]/h via INTRAVENOUS
  Filled 2019-04-27 (×2): qty 250

## 2019-04-27 MED ORDER — SODIUM CHLORIDE 0.9% FLUSH
3.0000 mL | Freq: Two times a day (BID) | INTRAVENOUS | Status: DC
  Administered 2019-04-28 – 2019-04-29 (×2): 3 mL via INTRAVENOUS

## 2019-04-27 MED ORDER — BUDESONIDE 0.5 MG/2ML IN SUSP: 0.5000 mg | Freq: Every day | RESPIRATORY_TRACT | Status: DC

## 2019-04-27 MED ORDER — OXYCODONE-ACETAMINOPHEN 5-325 MG PO TABS
1.0000 | ORAL_TABLET | Freq: Four times a day (QID) | ORAL | Status: DC | PRN
  Administered 2019-04-28 – 2019-04-29 (×2): 1 via ORAL
  Filled 2019-04-27 (×2): qty 1

## 2019-04-27 MED ORDER — LOSARTAN POTASSIUM 50 MG PO TABS
50.0000 mg | ORAL_TABLET | Freq: Every day | ORAL | Status: DC
  Administered 2019-04-28 – 2019-04-29 (×2): 50 mg via ORAL
  Filled 2019-04-27 (×2): qty 1

## 2019-04-27 MED ORDER — LEVETIRACETAM 500 MG PO TABS
500.0000 mg | ORAL_TABLET | Freq: Two times a day (BID) | ORAL | Status: DC
  Administered 2019-04-27: 14:00:00 500 mg via ORAL
  Filled 2019-04-27: qty 1

## 2019-04-27 MED ORDER — FLUTICASONE PROPIONATE 50 MCG/ACT NA SUSP
2.0000 | Freq: Every day | NASAL | Status: DC
  Administered 2019-04-28 (×2): 2 via NASAL
  Filled 2019-04-27: qty 16

## 2019-04-27 MED ORDER — SIMVASTATIN 20 MG PO TABS
40.0000 mg | ORAL_TABLET | Freq: Every day | ORAL | Status: DC
  Administered 2019-04-28 – 2019-04-29 (×3): 40 mg via ORAL
  Filled 2019-04-27 (×2): qty 4
  Filled 2019-04-27 (×2): qty 2

## 2019-04-27 MED ORDER — DM-GUAIFENESIN ER 30-600 MG PO TB12
1.0000 | ORAL_TABLET | Freq: Two times a day (BID) | ORAL | Status: DC
  Administered 2019-04-27 – 2019-04-29 (×5): 1 via ORAL
  Filled 2019-04-27 (×5): qty 1

## 2019-04-27 MED ORDER — LEVETIRACETAM 500 MG PO TABS
500.0000 mg | ORAL_TABLET | Freq: Every morning | ORAL | Status: DC
  Administered 2019-04-28 – 2019-04-29 (×2): 500 mg via ORAL
  Filled 2019-04-27 (×2): qty 1

## 2019-04-27 MED ORDER — LORAZEPAM 2 MG/ML IJ SOLN: 1.0000 mg | INTRAMUSCULAR | Status: DC | PRN

## 2019-04-27 MED ORDER — ACETAMINOPHEN 325 MG PO TABS
650.0000 mg | ORAL_TABLET | Freq: Four times a day (QID) | ORAL | Status: DC | PRN
  Administered 2019-04-27: 650 mg via ORAL
  Filled 2019-04-27: qty 2

## 2019-04-27 MED ORDER — INSULIN GLARGINE 100 UNIT/ML ~~LOC~~ SOLN
100.0000 [IU] | Freq: Every day | SUBCUTANEOUS | Status: DC
  Administered 2019-04-28: 21:00:00 100 [IU] via SUBCUTANEOUS
  Filled 2019-04-27 (×3): qty 1

## 2019-04-27 MED ORDER — PANTOPRAZOLE SODIUM 40 MG PO TBEC
40.0000 mg | DELAYED_RELEASE_TABLET | Freq: Every day | ORAL | Status: DC
  Administered 2019-04-28 – 2019-04-29 (×3): 40 mg via ORAL
  Filled 2019-04-27 (×4): qty 1

## 2019-04-27 MED ORDER — MOMETASONE FURO-FORMOTEROL FUM 100-5 MCG/ACT IN AERO
2.0000 | INHALATION_SPRAY | Freq: Two times a day (BID) | RESPIRATORY_TRACT | Status: DC
  Administered 2019-04-28 – 2019-04-29 (×4): 2 via RESPIRATORY_TRACT
  Filled 2019-04-27: qty 8.8

## 2019-04-27 MED ORDER — CLOPIDOGREL BISULFATE 75 MG PO TABS
75.0000 mg | ORAL_TABLET | Freq: Every day | ORAL | Status: DC
  Administered 2019-04-28 – 2019-04-29 (×3): 75 mg via ORAL
  Filled 2019-04-27 (×3): qty 1

## 2019-04-27 MED ORDER — ALBUTEROL SULFATE (2.5 MG/3ML) 0.083% IN NEBU: 3.0000 mL | INHALATION_SOLUTION | RESPIRATORY_TRACT | Status: DC | PRN

## 2019-04-27 MED ORDER — GABAPENTIN 400 MG PO CAPS
800.0000 mg | ORAL_CAPSULE | Freq: Every day | ORAL | Status: DC
  Administered 2019-04-28 (×2): 800 mg via ORAL
  Filled 2019-04-27 (×2): qty 2

## 2019-04-27 MED ORDER — SODIUM CHLORIDE 0.9 % WEIGHT BASED INFUSION
1.0000 mL/kg/h | INTRAVENOUS | Status: DC
  Administered 2019-04-28: 08:00:00 1 mL/kg/h via INTRAVENOUS

## 2019-04-27 MED ORDER — INSULIN ASPART 100 UNIT/ML ~~LOC~~ SOLN
0.0000 [IU] | SUBCUTANEOUS | Status: DC
  Administered 2019-04-27: 14:00:00 1 [IU] via SUBCUTANEOUS
  Administered 2019-04-28 (×2): 2 [IU] via SUBCUTANEOUS
  Administered 2019-04-28: 1 [IU] via SUBCUTANEOUS
  Administered 2019-04-29 (×2): 2 [IU] via SUBCUTANEOUS
  Administered 2019-04-29: 08:00:00 1 [IU] via SUBCUTANEOUS
  Filled 2019-04-27 (×7): qty 1

## 2019-04-27 MED ORDER — LEVETIRACETAM 750 MG PO TABS
750.0000 mg | ORAL_TABLET | Freq: Every day | ORAL | Status: DC
  Administered 2019-04-28 (×2): 750 mg via ORAL
  Filled 2019-04-27 (×3): qty 1

## 2019-04-27 MED ORDER — EZETIMIBE 10 MG PO TABS
10.0000 mg | ORAL_TABLET | Freq: Every day | ORAL | Status: DC
  Administered 2019-04-28 – 2019-04-29 (×2): 10 mg via ORAL
  Filled 2019-04-27 (×2): qty 1

## 2019-04-27 MED ORDER — CYCLOSPORINE 0.05 % OP EMUL
2.0000 [drp] | Freq: Two times a day (BID) | OPHTHALMIC | Status: DC
  Administered 2019-04-28 – 2019-04-29 (×3): 2 [drp] via OPHTHALMIC
  Filled 2019-04-27 (×5): qty 1

## 2019-04-27 MED ORDER — NITROGLYCERIN 0.4 MG SL SUBL
0.4000 mg | SUBLINGUAL_TABLET | SUBLINGUAL | Status: DC | PRN
  Administered 2019-04-27 (×3): 0.4 mg via SUBLINGUAL
  Filled 2019-04-27 (×3): qty 1

## 2019-04-27 MED ORDER — POLYETHYLENE GLYCOL 3350 17 G PO PACK
17.0000 g | PACK | Freq: Every day | ORAL | Status: DC | PRN
  Administered 2019-04-29: 17 g via ORAL
  Filled 2019-04-27: qty 1

## 2019-04-27 MED ORDER — CARVEDILOL 3.125 MG PO TABS
3.1250 mg | ORAL_TABLET | Freq: Two times a day (BID) | ORAL | Status: DC
  Administered 2019-04-28: 11:00:00 3.125 mg via ORAL
  Filled 2019-04-27: qty 1

## 2019-04-27 MED ORDER — ACETAMINOPHEN 500 MG PO TABS
1000.0000 mg | ORAL_TABLET | Freq: Once | ORAL | Status: AC
  Administered 2019-04-27: 12:00:00 1000 mg via ORAL
  Filled 2019-04-27: qty 2

## 2019-04-27 MED ORDER — SODIUM CHLORIDE 0.9 % WEIGHT BASED INFUSION
3.0000 mL/kg/h | INTRAVENOUS | Status: AC
  Administered 2019-04-28: 3 mL/kg/h via INTRAVENOUS

## 2019-04-27 MED ORDER — DULOXETINE HCL 20 MG PO CPEP
40.0000 mg | ORAL_CAPSULE | Freq: Every day | ORAL | Status: DC
  Administered 2019-04-28 – 2019-04-29 (×3): 40 mg via ORAL
  Filled 2019-04-27 (×3): qty 2

## 2019-04-27 MED ORDER — SPIRONOLACTONE 25 MG PO TABS
25.0000 mg | ORAL_TABLET | Freq: Every day | ORAL | Status: DC
  Administered 2019-04-28 – 2019-04-29 (×2): 25 mg via ORAL
  Filled 2019-04-27 (×2): qty 1

## 2019-04-27 MED ORDER — PNEUMOCOCCAL VAC POLYVALENT 25 MCG/0.5ML IJ INJ
0.5000 mL | INJECTION | INTRAMUSCULAR | Status: AC
  Administered 2019-04-29: 15:00:00 0.5 mL via INTRAMUSCULAR
  Filled 2019-04-27: qty 0.5

## 2019-04-27 MED ORDER — CALCIUM CARBONATE-VITAMIN D 500-200 MG-UNIT PO TABS
1.0000 | ORAL_TABLET | Freq: Every day | ORAL | Status: DC
  Administered 2019-04-28 – 2019-04-29 (×2): 1 via ORAL
  Filled 2019-04-27 (×2): qty 1

## 2019-04-27 MED ORDER — ONDANSETRON HCL 4 MG/2ML IJ SOLN
4.0000 mg | Freq: Three times a day (TID) | INTRAMUSCULAR | Status: DC | PRN
  Administered 2019-04-27: 4 mg via INTRAVENOUS
  Filled 2019-04-27: qty 2

## 2019-04-27 MED ORDER — SODIUM CHLORIDE 0.9 % IV SOLN: 250.0000 mL | INTRAVENOUS | Status: DC | PRN

## 2019-04-27 MED ORDER — MIRABEGRON ER 50 MG PO TB24
50.0000 mg | ORAL_TABLET | Freq: Every day | ORAL | Status: DC
  Administered 2019-04-28 – 2019-04-29 (×3): 50 mg via ORAL
  Filled 2019-04-27 (×3): qty 1

## 2019-04-27 NOTE — Consult Note (Signed)
[facility] Cardiology Consultation Note  Patient ID: [name], MRN: [id number], DOB/AGE: 1965/07/07 54 y.o. Admit date: 04/27/2019   Date of Consult: 04/27/2019 Primary Physician: Hubbard Hartshorn, [name] Primary Cardiologist: [facility]  Chief Complaint:  Chief Complaint  Patient presents with  . Chest Pain   Reason for Consult: Chest pain  HPI: 54 y.o. female with known diabetes hypertension hyperlipidemia valvular heart disease diastolic dysfunction and apparent pulmonary disease who has had significant progression of shortness of breath weakness and fatigue and chest discomfort radiating into her left arm despite appropriate medication management.  The patient has had a recent stress test showing a minimal reversible distal anterior myocardial perfusion defect consistent with possible myocardial ischemia.  Echocardiogram shows mild global LV systolic dysfunction and some worsening septal dysfunction with moderate tricuspid regurgitation with pulmonary hypertension.  The patient has been slated for further evaluation despite primary treatment with medication management but continued to have significant progression of symptoms.  EKG has shown normal sinus rhythm with interventricular conduction defect.  Troponin currently shows no evidence of myocardial infarction.  The patient does have improvements of symptoms at this time and is hemodynamically stable  Past Medical History:  Diagnosis Date  . Anxiety and depression 09/13/2006   Qualifier: Diagnosis of  By: Hassell Done [name], Tori Milks    . Arthritis    joint pain   . Asthma   . COPD (chronic obstructive pulmonary disease) (HCC)    chronic bronchitis   . Depression   . Diabetes mellitus    since age 89; type 2 IDDM  . Diabetic neuropathy, painful ([place])    FEET AND HANDS  . Diabetic retinopathy ([place])   . Diastolic dysfunction    a.  Echo 11/17: EF 60-65%, mild LVH, no RWMA, Gr1DD, mild AI, dilated aortic root measuring 38 mm, mildly  dilated ascending aorta  . Dilated aortic root ([place])    1m by echo 11/2015  . Dizziness    secondary to diabetes and hypertension   . Epilepsy idiopathic petit mal ([place]    last seizure 2012;controlled w/ topomax  . GERD (gastroesophageal reflux disease)   . Glaucoma    NOT ON ANY EYE DROPS   . Headache(784.0)    migraines   . Heart murmur    born with   . History of stress test    a. 11/17: Normal perfusion, EF 53%, normal study  . Hx of blood clots    hematomas removed from left side of brain from [id number]   . Hyperlipidemia   . Hypertension   . Legally blind    left eye   . MIGRAINE HEADACHE 09/14/2006   Qualifier: Diagnosis of  By: MarHassell DoneP, NykTori Milks . Myocardial infarction ([place]  a.  Patient reported history of without objective documentation  . Nephrolithiasis    frequent urination , urination at nite  PT SEEN IN ER 09/22/11 FOR BACK AND RT SIDED PAIN--HAS KNOWN STONE RT URETER AND UA IN ER SHOWED UTI  . Pain 09/23/11   LOWER BACK AND RIGHT SIDE--PT HAS RIGHT URETERAL STONE  . Pneumonia    hx of 2009  . Pyelonephritis   . [place] . [facility] ap[facility]    [facility] ; mild  . Stress incontinence   . [facility]  [facility]  RESIDUAL LEFT LEG WEAKNESS--NO OTHER RESIDUAL PROBLEMS      Surgical History:  Past Surgical History:  Procedure Laterality Date  . BRAIN HEMATOMA EVACUATION     five procedures total, first procedure when 52 months old, last at 54 years of age.  . CYSTOSCOPY W/ RETROGRADES  09/24/2011   Procedure: CYSTOSCOPY WITH RETROGRADE PYELOGRAM;  Surgeon: Malka So, MD;  Location: [facility];  Service: Urology;  Laterality: Right;  . CYSTOSCOPY WITH URETEROSCOPY  09/24/2011   Procedure: CYSTOSCOPY WITH URETEROSCOPY;  Surgeon: Malka So, MD;  Location: [facility];  Service: Urology;  Laterality: Right;  Balloon dilation right ureter   . CYSTOSCOPY/RETROGRADE/URETEROSCOPY  08/24/2011   Procedure:  CYSTOSCOPY/RETROGRADE/URETEROSCOPY;  Surgeon: Molli Hazard, MD;  Location: [facility];  Service: Urology;  Laterality: Right;  Cysto, Right retrograde Pyelogram, right stent placement.   Fatima Blank HERNIA REPAIR  05/05/2011   Procedure: LAPAROSCOPIC INCISIONAL HERNIA;  Surgeon: Rolm Bookbinder, MD;  Location: [place];  Service: General;  Laterality: N/A;  . kidney stone removal    . MASS EXCISION  05/05/2011   Procedure: EXCISION MASS;  Surgeon: Rolm Bookbinder, MD;  Location: [place];  Service: General;  Laterality: Right;  . PCNL    . RETINAL DETACHMENT SURGERY Left 1990  . SHUNT REMOVAL     shunt inserted at age 75 removed at age 28   . VAGINAL HYSTERECTOMY  1996     Home Meds: Prior to Admission medications   Medication Sig Start Date End Date Taking? Authorizing Provider  albuterol (ACCUNEB) 1.25 MG/3ML nebulizer solution Inhale into the lungs. Use 3 mls by nebulization every 6 hours as needed for wheezing 01/26/19 01/26/20  [provider]  albuterol (PROAIR HFA) 108 (90 Base) MCG/ACT inhaler INHALE 2 PUFFS BY MOUTH EVERY 6 HOURS AS NEEDED FOR WHEEZING OR SHORTNESS OF BREATH 01/24/18   Hubbard Hartshorn, [name]  AMBULATORY NON FORMULARY MEDICATION Medication Name: Muscle and Joint Vanishing Scent Gel- Apply as needed to skin    [provider]  amLODipine (NORVASC) 5 MG tablet Take 1 tablet (5 mg total) by mouth daily. Patient not taking: Reported on 04/26/2019 10/12/18   Delsa Grana, [name]  baclofen (LIORESAL) 10 MG tablet Take 1 tablet (10 mg total) by mouth 3 (three) times daily. 07/21/18   Hubbard Hartshorn, [name]  Blood Glucose Monitoring Suppl (ACCU-CHEK AVIVA PLUS) w/Device KIT 1 each by XX route as directed for up to 1 day 10/17/18   [provider]  budesonide (PULMICORT) 0.5 MG/2ML nebulizer solution Inhale 0.5 mg into the lungs daily. Take 2 mls by nebulizations once daily  01/26/19 01/26/20  [provider]  Calcium Carbonate-Vitamin D (CALCIUM-VITAMIN D3 PO) D3  500- calcium 630 mg Take 1 capsule by mouth once daily    [provider]  carvedilol (COREG) 3.125 MG tablet Take 3.125 mg by mouth 2 (two) times daily.  01/23/19 01/23/20  [provider]  clopidogrel (PLAVIX) 75 MG tablet Take 1 tablet (75 mg total) by mouth daily. 10/07/18   Steele Sizer, MD  cycloSPORINE (RESTASIS) 0.05 % ophthalmic emulsion Place 2 drops into both eyes 2 (two) times daily.    [provider]  Dulaglutide 1.5 MG/0.5ML SOPN Inject 1.5 mg into the skin once a week. 10/20/18   Hubbard Hartshorn, [name]  DULoxetine (CYMBALTA) 20 MG capsule Take 2 capsules (40 mg total) by mouth daily. 04/19/19   Gillis Santa, MD  DUPIXENT 300 MG/2ML SOPN Inject 300 mg into the skin every 14 (fourteen) days.  04/04/19   [provider]  EPINEPHrine  0.3 mg/0.3 mL IJ SOAJ injection Inject 0.3 mg into the muscle as needed for anaphylaxis.  03/29/19   [provider]  ezetimibe (ZETIA) 10 MG tablet Take 1 tablet (10 mg total) by mouth daily. 04/18/19   Towanda Malkin, MD  fluorometholone (FML) 0.1 % ophthalmic suspension Place 2 drops into both eyes 2 (two) times daily.    [provider]  fluticasone (FLONASE) 50 MCG/ACT nasal spray Place 2 sprays into both nostrils daily. 04/18/19   Towanda Malkin, MD  Fluticasone-Salmeterol (WIXELA INHUB) 100-50 MCG/DOSE AEPB Inhale 1 puff into the lungs 2 (two) times daily.    [provider]  gabapentin (NEURONTIN) 400 MG capsule 400 mg qAM, 400 mg qPM, 800 mg qhs 04/19/19   Gillis Santa, MD  glucose blood (ACCU-CHEK AVIVA PLUS) test strip Use 1 each (1 strip total) 4 (four) times daily for 90 days Use as instructed. 10/17/18   [provider]  insulin aspart (NOVOLOG FLEXPEN) 100 UNIT/ML FlexPen Inject 18-50 Units into the skin 3 (three) times daily with meals.  06/08/18 06/08/19  [provider]  Insulin Glargine, 2 Unit Dial, 300 UNIT/ML SOPN Inject 120 Units into the skin  daily. Patient taking differently: Inject 136 Units into the skin daily.  03/30/18   Hubbard Hartshorn, [name]  Insulin Pen Needle (EASY TOUCH PEN NEEDLES) 31G X 8 MM MISC Use as directed in giving insulin. Dx: E11.29 03/25/18   Hubbard Hartshorn, [name]  levETIRAcetam (KEPPRA) 500 MG tablet Take 500 mg by mouth 2 (two) times daily. 07/08/18   [provider]  losartan (COZAAR) 100 MG tablet Take 1 tablet (100 mg total) by mouth daily. 07/21/18   Hubbard Hartshorn, [name]  mirabegron ER (MYRBETRIQ) 50 MG TB24 tablet Take 1 tablet (50 mg total) by mouth daily. 04/18/19   Towanda Malkin, MD  ondansetron (ZOFRAN ODT) 4 MG disintegrating tablet Take 1 tablet (4 mg total) by mouth every 8 (eight) hours as needed for nausea or vomiting. 04/18/19   Towanda Malkin, MD  [facility] DELICA LANCETS 95G MISC Use to check sugar three times daily as directed.Dx: E11.29; E11.40 10/21/16   Gildardo Cranker, DO  oxyCODONE-acetaminophen (PERCOCET) 5-325 MG tablet Take 1 tablet by mouth every 12 (twelve) hours as needed for severe pain. 04/19/19 05/19/19  Gillis Santa, MD  pantoprazole (PROTONIX) 40 MG tablet Take 1 tablet (40 mg total) by mouth daily. 07/21/18   Hubbard Hartshorn, [name]  polyethylene glycol (MIRALAX / GLYCOLAX) packet Take 17 g by mouth daily as needed for mild constipation. 07/30/16   Lauree Chandler, NP  potassium chloride SA (KLOR-CON M20) 20 MEQ tablet Take 2 tablets (40 mEq total) by mouth daily. 03/01/19   Towanda Malkin, MD  predniSONE (DELTASONE) 20 MG tablet Take 20 mg by mouth daily.  01/26/19   [provider]  simvastatin (ZOCOR) 20 MG tablet Take 1 tablet (20 mg total) by mouth at bedtime. 04/18/19   Towanda Malkin, MD  spironolactone (ALDACTONE) 25 MG tablet Take 1 tablet by mouth daily. 01/23/19   [provider]  terconazole (TERAZOL 7) 0.4 % vaginal cream Place 1 applicator vaginally at bedtime as needed (irritation).    [provider]    Inpatient  Medications:  . heparin  4,000 Units Intravenous Once   . heparin      Allergies:  Allergies  Allergen Reactions  . Codeine Swelling and Other (See Comments)    Swelling and burning  of mouth (inside)  . Lactose Intolerance (Gi) Nausea And Vomiting    Social History   Socioeconomic History  . Marital status: Widowed    Spouse name: Not on file  . Number of children: 2  . Years of education: Not on file  . Highest education level: Not on file  Occupational History  . Occupation: disabled  Tobacco Use  . Smoking status: Former Smoker    Packs/day: 1.00    Years: 15.00    Pack years: 15.00    Types: Cigarettes    Quit date: 01/20/1987    Years since quitting: 32.2  . Smokeless tobacco: Never Used  . Tobacco comment: quit more than 20 years  Substance and Sexual Activity  . Alcohol use: Never    Alcohol/week: 0.0 standard drinks  . Drug use: No    Comment: hx of marijuana use no longer uses   . Sexual activity: Yes    Partners: Male    Birth control/protection: Surgical  Other Topics Concern  . Not on file  Social History Narrative  . Not on file   Social Determinants of Health   Financial Resource Strain: Low Risk   . Difficulty of Paying Living Expenses: Not very hard  Food Insecurity: No Food Insecurity  . Worried About Charity fundraiser in the Last Year: Never true  . Ran Out of Food in the Last Year: Never true  Transportation Needs: No Transportation Needs  . Lack of Transportation (Medical): No  . Lack of Transportation (Non-Medical): No  Physical Activity: Insufficiently Active  . Days of Exercise per Week: 2 days  . Minutes of Exercise per Session: 20 min  Stress: No Stress Concern Present  . Feeling of Stress : Only a little  Social Connections: Slightly Isolated  . Frequency of Communication with Friends and Family: More than three times a week  . Frequency of Social Gatherings with Friends and Family: More than three times a week  . Attends  Religious Services: More than 4 times per year  . Active Member of Clubs or Organizations: Yes  . Attends Archivist Meetings: More than 4 times per year  . Marital Status: Widowed  Intimate Partner Violence: Not At Risk  . Fear of Current or Ex-Partner: No  . Emotionally Abused: No  . Physically Abused: No  . Sexually Abused: No     Family History  Problem Relation Age of Onset  . Uterine cancer Mother   . Hypertension Mother   . Cancer Mother   . Brain cancer Maternal Grandmother   . Hypertension Maternal Grandmother   . Birth defects Daughter   . Hypertension Daughter   . Cirrhosis Maternal Grandfather   . ADD / ADHD Maternal Grandfather   . Birth defects Maternal Grandfather   . Diabetes Maternal Grandfather   . Anesthesia problems Neg Hx   . Colon cancer Neg Hx   . Esophageal cancer Neg Hx   . Pancreatic cancer Neg Hx      Review of Systems Positive for chest pain shortness of breath Negative for: General:  chills, fever, night sweats or weight changes.  Cardiovascular: PND orthopnea syncope dizziness  Dermatological skin lesions rashes Respiratory: Cough congestion Urologic: Frequent urination urination at night and hematuria Abdominal: negative for nausea, vomiting, diarrhea, bright red blood per rectum, melena, or hematemesis Neurologic: negative for visual changes, and/or hearing changes  All other systems reviewed and are otherwise negative except as noted above.  Labs: No results  for input(s): CKTOTAL, CKMB, TROPONINI in the last 72 hours. Lab Results  Component Value Date   WBC 5.1 04/27/2019   HGB 12.2 04/27/2019   HCT 39.0 04/27/2019   MCV 95.4 04/27/2019   PLT 242 04/27/2019    Recent Labs  Lab 04/27/19 0903  NA 140  K 4.0  CL 104  CO2 28  BUN 13  CREATININE 0.94  CALCIUM 9.1  GLUCOSE 197*   Lab Results  Component Value Date   CHOL 145 10/12/2018   HDL 42 (L) 10/12/2018   LDLCALC 83 10/12/2018   TRIG 106 10/12/2018   No  results found for: DDIMER  Radiology/Studies:  DG Chest 2 View  Result Date: 04/27/2019 CLINICAL DATA:  Chest pain EXAM: CHEST - 2 VIEW COMPARISON:  November 07, 2018 FINDINGS: Lungs are clear. Heart size and pulmonary vascularity are normal. No adenopathy. No pneumothorax. No bone lesions. IMPRESSION: Lungs clear.  Cardiac silhouette within normal limits. Electronically Signed   By: Lowella Grip III M.D.   On: 04/27/2019 09:46    EKG: Normal sinus rhythm possible left atrial enlargement with nonspecific ST changes  Weights: Filed Weights   04/27/19 0903  Weight: 101.6 kg     Physical Exam: Blood pressure (!) 185/117, pulse 82, temperature 98.2 F (36.8 C), temperature source Oral, resp. rate 17, height 5' 6"  (1.676 m), weight 101.6 kg, SpO2 99 %. Body mass index is 36.15 kg/m. General: Well developed, well nourished, in no acute distress. Head eyes ears nose throat: Normocephalic, atraumatic, sclera non-icteric, no xanthomas, nares are without discharge. No apparent thyromegaly and/or mass  Lungs: Normal respiratory effort.  no wheezes, no rales, no rhonchi.  Heart: RRR with normal S1 S2. no murmur gallop, no rub, PMI is normal size and placement, carotid upstroke normal without bruit, jugular venous pressure is normal Abdomen: Soft, non-tender, non-distended with normoactive bowel sounds. No hepatomegaly. No rebound/guarding. No obvious abdominal masses. Abdominal aorta is normal size without bruit Extremities: No edema. no cyanosis, no clubbing, no ulcers  Peripheral : 2+ bilateral upper extremity pulses, 2+ bilateral femoral pulses, 2+ bilateral dorsal pedal pulse Neuro: Alert and oriented. No facial asymmetry. No focal deficit. Moves all extremities spontaneously. Musculoskeletal: Normal muscle tone without kyphosis Psych:  Responds to questions appropriately with a normal affect.    Assessment: 54 year old female with hypertension hyperlipidemia diabetes apparent COPD with  abnormal stress test possible pulmonary hypertension chest pain and shortness of breath consistent with unstable angina  Plan: 1.  Continue current medical regimen for diabetes hypertension hyperlipidemia as before 2.  Nitrates for chest discomfort and possible malignant hypertension 3.  Consider heparin for further risk reduction of myocardial infarction 4.  Proceed to cardiac catheterization with right and left heart catheterization to assess coronary anatomy and further treatment thereof is necessary as well as for possible pulmonary hypertension and ischemic changes.  Patient understands the risk and benefits of cardiac catheterization.  This includes the possibility of death stroke heart attack infection bleeding or blood clot.  She is at low risk for conscious sedation  Signed, Corey Skains M.D. [facility] 04/27/2019, 12:28 PM

## 2019-04-27 NOTE — ED Provider Notes (Signed)
[facility] Emergency Department Provider Note  ____________________________________________  Time seen: Approximately 12:35 PM  I have reviewed the triage vital signs and the nursing notes.   HISTORY  Chief Complaint Chest Pain    HPI [name] is a 54 y.o. female with a history of hypertension depression COPD diastolic dysfunction multiple strokes in the past on daily Plavix who comes the ED due to worsening chest pain.  It is central chest, radiating to left arm, described as heaviness, 10/10.  This pain has been waxing and waning for the past couple of weeks, worse with exertion.  She has seen cardiology and Dr. Clayborn Bigness has scheduled for a left heart cath and right heart cath to be done on April 15.  However, yesterday her pain became much worse and unremitting.  She was unable to sleep last night and this morning she called her cardiology clinic instructed her to come to the ED for evaluation.  Pain is not pleuritic.  Associated with shortness of breath.  No cough or fever.      Past Medical History:  Diagnosis Date  . Anxiety and depression 09/13/2006   Qualifier: Diagnosis of  By: Hassell Done [name], Tori Milks    . Arthritis    joint pain   . Asthma   . COPD (chronic obstructive pulmonary disease) (HCC)    chronic bronchitis   . Depression   . Diabetes mellitus    since age 19; type 2 IDDM  . Diabetic neuropathy, painful ([place])    FEET AND HANDS  . Diabetic retinopathy ([place])   . Diastolic dysfunction    a.  Echo 11/17: EF 60-65%, mild LVH, no RWMA, Gr1DD, mild AI, dilated aortic root measuring 38 mm, mildly dilated ascending aorta  . Dilated aortic root ([place])    41m by echo 11/2015  . Dizziness    secondary to diabetes and hypertension   . Epilepsy idiopathic petit mal ([place]    last seizure 2012;controlled w/ topomax  . GERD (gastroesophageal reflux disease)   . Glaucoma    NOT ON ANY EYE DROPS   . Headache(784.0)    migraines   .  Heart murmur    born with   . History of stress test    a. 11/17: Normal perfusion, EF 53%, normal study  . Hx of blood clots    hematomas removed from left side of brain from [id number]   . Hyperlipidemia   . Hypertension   . Legally blind    left eye   . MIGRAINE HEADACHE 09/14/2006   Qualifier: Diagnosis of  By: MarHassell DoneP, NykTori Milks . Myocardial infarction ([place]CC[place]  a.  Patient reported history of without objective documentation  . Nephrolithiasis    frequent urination , urination at nite  PT SEEN IN ER 09/22/11 FOR BACK AND RT SIDED PAIN--HAS KNOWN STONE RT URETER AND UA IN ER SHOWED UTI  . Pain 09/23/11   LOWER BACK AND RIGHT SIDE--PT HAS RIGHT URETERAL STONE  . Pneumonia    hx of 2009  . Pyelonephritis   . Seizures ([place] . Sleep apnea    [facility] ; mild  . Stress incontinence   . [facility]  [facility]  RESIDUAL LEFT LEG WEAKNESS--NO OTHER RESIDUAL PROBLEMS     Patient Active Problem List   Diagnosis Date Noted  . Myalgia 04/19/2019  . 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  Non-intractable vomiting 03/01/2019  . History of CVA (cerebrovascular accident) 07/08/2018  . History of seizure 07/08/2018  . Diabetic polyneuropathy associated with type 2 diabetes mellitus ([place]) 03/02/2018  . Obesity (BMI 30-39.9) 03/02/2018  . Vitamin D deficiency 01/24/2018  . Vulvovaginal candidiasis 01/24/2018  . Hypokalemia 06/08/2017  . Overactive bladder 05/18/2017  . Asthma 06/22/2016  . Dilated aortic root ([place])   . Sacroiliac dysfunction 10/09/2014  . Chronic low back pain 09/03/2014  . CVA, old, hemiparesis ([place]) 09/03/2014  . OSA (obstructive sleep apnea) 05/15/2014  . Restrictive airway disease 05/15/2014  . Colitis 02/22/2014  . Type 2 diabetes, uncontrolled, with renal manifestation ([place]) 01/21/2014  . Chronic pain syndrome 01/21/2014  . Headache disorder 09/07/2013  . Diabetic neuropathy ([place]) 05/23/2013  . Mixed urge and  stress incontinence 08/25/2011  . Ureteral stricture, right 08/25/2011  . Pyelonephritis 08/22/2011  . Nephrolithiasis 08/22/2011  . Incisional hernia 05/10/2011  . Morbid obesity ([place]) 05/10/2011  . Tachycardia 05/08/2011  . BLINDNESS, [place] DEFINITION 09/14/2006  . Hyperlipemia 09/13/2006  . Essential hypertension 09/13/2006  . MYOCARDIAL INFARCTION, HX OF 09/13/2006  . GERD 09/13/2006  . Seizure disorder ([place]) 09/13/2006     Past Surgical History:  Procedure Laterality Date  . BRAIN HEMATOMA EVACUATION     five procedures total, first procedure when 10 months old, last at 54 years of age.  . CYSTOSCOPY W/ RETROGRADES  09/24/2011   Procedure: CYSTOSCOPY WITH RETROGRADE PYELOGRAM;  Surgeon: Malka So, MD;  Location: [facility];  Service: Urology;  Laterality: Right;  . CYSTOSCOPY WITH URETEROSCOPY  09/24/2011   Procedure: CYSTOSCOPY WITH URETEROSCOPY;  Surgeon: Malka So, MD;  Location: [facility];  Service: Urology;  Laterality: Right;  Balloon dilation right ureter   . CYSTOSCOPY/RETROGRADE/URETEROSCOPY  08/24/2011   Procedure: CYSTOSCOPY/RETROGRADE/URETEROSCOPY;  Surgeon: Molli Hazard, MD;  Location: [facility];  Service: Urology;  Laterality: Right;  Cysto, Right retrograde Pyelogram, right stent placement.   Fatima Blank HERNIA REPAIR  05/05/2011   Procedure: LAPAROSCOPIC INCISIONAL HERNIA;  Surgeon: Rolm Bookbinder, MD;  Location: [place];  Service: General;  Laterality: N/A;  . kidney stone removal    . MASS EXCISION  05/05/2011   Procedure: EXCISION MASS;  Surgeon: Rolm Bookbinder, MD;  Location: [place];  Service: General;  Laterality: Right;  . PCNL    . RETINAL DETACHMENT SURGERY Left 1990  . SHUNT REMOVAL     shunt inserted at age 55 removed at age 55   . VAGINAL HYSTERECTOMY  1996     Prior to Admission medications   Medication Sig Start Date End Date Taking? Authorizing Provider  albuterol (ACCUNEB) 1.25 MG/3ML nebulizer solution Inhale into the lungs. Use 3 mls  by nebulization every 6 hours as needed for wheezing 01/26/19 01/26/20  [provider]  albuterol (PROAIR HFA) 108 (90 Base) MCG/ACT inhaler INHALE 2 PUFFS BY MOUTH EVERY 6 HOURS AS NEEDED FOR WHEEZING OR SHORTNESS OF BREATH 01/24/18   Hubbard Hartshorn, [name]  AMBULATORY NON FORMULARY MEDICATION Medication Name: Muscle and Joint Vanishing Scent Gel- Apply as needed to skin    [provider]  amLODipine (NORVASC) 5 MG tablet Take 1 tablet (5 mg total) by mouth daily. Patient not taking: Reported on 04/26/2019 10/12/18   Delsa Grana, [name]  baclofen (LIORESAL) 10 MG tablet Take 1 tablet (10 mg total) by mouth 3 (three) times daily. 07/21/18   Hubbard Hartshorn, [name]  Blood Glucose Monitoring Suppl (ACCU-CHEK AVIVA PLUS) w/Device KIT 1  each by XX route as directed for up to 1 day 10/17/18   [provider]  budesonide (PULMICORT) 0.5 MG/2ML nebulizer solution Inhale 0.5 mg into the lungs daily. Take 2 mls by nebulizations once daily  01/26/19 01/26/20  [provider]  Calcium Carbonate-Vitamin D (CALCIUM-VITAMIN D3 PO) D3 500- calcium 630 mg Take 1 capsule by mouth once daily    [provider]  carvedilol (COREG) 3.125 MG tablet Take 3.125 mg by mouth 2 (two) times daily.  01/23/19 01/23/20  [provider]  clopidogrel (PLAVIX) 75 MG tablet Take 1 tablet (75 mg total) by mouth daily. 10/07/18   Steele Sizer, MD  cycloSPORINE (RESTASIS) 0.05 % ophthalmic emulsion Place 2 drops into both eyes 2 (two) times daily.    [provider]  Dulaglutide 1.5 MG/0.5ML SOPN Inject 1.5 mg into the skin once a week. 10/20/18   Hubbard Hartshorn, [name]  DULoxetine (CYMBALTA) 20 MG capsule Take 2 capsules (40 mg total) by mouth daily. 04/19/19   Gillis Santa, MD  DUPIXENT 300 MG/2ML SOPN Inject 300 mg into the skin every 14 (fourteen) days.  04/04/19   [provider]  EPINEPHrine 0.3 mg/0.3 mL IJ SOAJ injection Inject 0.3 mg into the muscle as needed for anaphylaxis.  03/29/19    [provider]  ezetimibe (ZETIA) 10 MG tablet Take 1 tablet (10 mg total) by mouth daily. 04/18/19   Towanda Malkin, MD  fluorometholone (FML) 0.1 % ophthalmic suspension Place 2 drops into both eyes 2 (two) times daily.    [provider]  fluticasone (FLONASE) 50 MCG/ACT nasal spray Place 2 sprays into both nostrils daily. 04/18/19   Towanda Malkin, MD  Fluticasone-Salmeterol (WIXELA INHUB) 100-50 MCG/DOSE AEPB Inhale 1 puff into the lungs 2 (two) times daily.    [provider]  gabapentin (NEURONTIN) 400 MG capsule 400 mg qAM, 400 mg qPM, 800 mg qhs 04/19/19   Gillis Santa, MD  glucose blood (ACCU-CHEK AVIVA PLUS) test strip Use 1 each (1 strip total) 4 (four) times daily for 90 days Use as instructed. 10/17/18   [provider]  insulin aspart (NOVOLOG FLEXPEN) 100 UNIT/ML FlexPen Inject 18-50 Units into the skin 3 (three) times daily with meals.  06/08/18 06/08/19  [provider]  Insulin Glargine, 2 Unit Dial, 300 UNIT/ML SOPN Inject 120 Units into the skin daily. Patient taking differently: Inject 136 Units into the skin daily.  03/30/18   Hubbard Hartshorn, [name]  Insulin Pen Needle (EASY TOUCH PEN NEEDLES) 31G X 8 MM MISC Use as directed in giving insulin. Dx: E11.29 03/25/18   Hubbard Hartshorn, [name]  levETIRAcetam (KEPPRA) 500 MG tablet Take 500 mg by mouth 2 (two) times daily. 07/08/18   [provider]  losartan (COZAAR) 100 MG tablet Take 1 tablet (100 mg total) by mouth daily. 07/21/18   Hubbard Hartshorn, [name]  mirabegron ER (MYRBETRIQ) 50 MG TB24 tablet Take 1 tablet (50 mg total) by mouth daily. 04/18/19   Towanda Malkin, MD  ondansetron (ZOFRAN ODT) 4 MG disintegrating tablet Take 1 tablet (4 mg total) by mouth every 8 (eight) hours as needed for nausea or vomiting. 04/18/19   Towanda Malkin, MD  [facility] DELICA LANCETS 65H MISC Use to check sugar three times daily as directed.Dx: E11.29; E11.40 10/21/16   Gildardo Cranker, DO  oxyCODONE-acetaminophen (PERCOCET) 5-325 MG tablet Take 1 tablet by mouth every 12 (twelve) hours as needed for severe pain. 04/19/19  05/19/19  Gillis Santa, MD  pantoprazole (PROTONIX) 40 MG tablet Take 1 tablet (40 mg total) by mouth daily. 07/21/18   Hubbard Hartshorn, [name]  polyethylene glycol (MIRALAX / GLYCOLAX) packet Take 17 g by mouth daily as needed for mild constipation. 07/30/16   Lauree Chandler, NP  potassium chloride SA (KLOR-CON M20) 20 MEQ tablet Take 2 tablets (40 mEq total) by mouth daily. 03/01/19   Towanda Malkin, MD  predniSONE (DELTASONE) 20 MG tablet Take 20 mg by mouth daily.  01/26/19   [provider]  simvastatin (ZOCOR) 20 MG tablet Take 1 tablet (20 mg total) by mouth at bedtime. 04/18/19   Towanda Malkin, MD  spironolactone (ALDACTONE) 25 MG tablet Take 1 tablet by mouth daily. 01/23/19   [provider]  terconazole (TERAZOL 7) 0.4 % vaginal cream Place 1 applicator vaginally at bedtime as needed (irritation).    [provider]     Allergies Codeine and Lactose intolerance (gi)   Family History  Problem Relation Age of Onset  . Uterine cancer Mother   . Hypertension Mother   . Cancer Mother   . Brain cancer Maternal Grandmother   . Hypertension Maternal Grandmother   . Birth defects Daughter   . Hypertension Daughter   . Cirrhosis Maternal Grandfather   . ADD / ADHD Maternal Grandfather   . Birth defects Maternal Grandfather   . Diabetes Maternal Grandfather   . Anesthesia problems Neg Hx   . Colon cancer Neg Hx   . Esophageal cancer Neg Hx   . Pancreatic cancer Neg Hx     Social History Social History   Tobacco Use  . Smoking status: Former Smoker    Packs/day: 1.00    Years: 15.00    Pack years: 15.00    Types: Cigarettes    Quit date: 01/20/1987    Years since quitting: 32.2  . Smokeless tobacco: Never Used  . Tobacco comment: quit more than 20 years  Substance Use Topics  . Alcohol use:  Never    Alcohol/week: 0.0 standard drinks  . Drug use: No    Comment: hx of marijuana use no longer uses     Review of Systems  Constitutional:   No fever or chills.  ENT:   No sore throat. No rhinorrhea. Cardiovascular:   Positive chest pain as above without palpitations or syncope. Respiratory: Positive shortness of breath without cough. Gastrointestinal:   Negative for abdominal pain, vomiting and diarrhea.  Musculoskeletal:   Negative for focal pain or swelling All other systems reviewed and are negative except as documented above in ROS and HPI.  ____________________________________________   PHYSICAL EXAM:  VITAL SIGNS: ED Triage Vitals  Enc Vitals Group     BP 04/27/19 0903 (!) 187/106     Pulse Rate 04/27/19 0903 93     Resp 04/27/19 0903 18     Temp 04/27/19 0903 98.2 F (36.8 C)     Temp Source 04/27/19 0903 Oral     SpO2 04/27/19 0903 98 %     Weight 04/27/19 0903 224 lb (101.6 kg)     Height 04/27/19 0903 5' 6"  (1.676 m)     Head Circumference --      Peak Flow --      Pain Score 04/27/19 0908 10     Pain Loc --      Pain Edu? --      Excl. in [place]? --     Vital signs  reviewed, nursing assessments reviewed.   Constitutional:   Alert and oriented. Non-toxic appearance. Eyes:   Conjunctivae are normal. EOMI. PERRL. ENT      Head:   Normocephalic and atraumatic.      Nose:   Wearing a mask.      Mouth/Throat:   Wearing a mask.      Neck:   No meningismus. Full ROM. Hematological/Lymphatic/Immunilogical:   No cervical lymphadenopathy. Cardiovascular:   RRR. Symmetric bilateral radial and DP pulses.  No murmurs. Cap refill less than 2 seconds.  No pulse deficit Respiratory:   Normal respiratory effort without tachypnea/retractions. Breath sounds are clear and equal bilaterally. No wheezes/rales/rhonchi. Gastrointestinal:   Soft and nontender. Non distended. There is no CVA tenderness.  No rebound, rigidity, or guarding.  Musculoskeletal:   Normal range of  motion in all extremities. No joint effusions.  Left knee tenderness at the joint line, mild effusion on exam Neurologic:   Normal speech and language.  Motor grossly intact. No acute focal neurologic deficits are appreciated.  Skin:    Skin is warm, dry and intact. No rash noted.  No petechiae, purpura, or bullae.  ____________________________________________    LABS (pertinent positives/negatives) (all labs ordered are listed, but only abnormal results are displayed) Labs Reviewed  BASIC METABOLIC PANEL - Abnormal; Notable for the following components:      Result Value   Glucose, Bld 197 (*)    All other components within normal limits  RESPIRATORY PANEL BY RT PCR (FLU A&B, COVID)  CBC  HEPARIN LEVEL (UNFRACTIONATED)  PROTIME-INR  APTT  TROPONIN I (HIGH SENSITIVITY)  TROPONIN I (HIGH SENSITIVITY)   ____________________________________________   EKG  Interpreted by me Normal sinus rhythm rate of 82, normal axis and intervals.  Poor R wave progression.  Normal ST segments and T waves.  Unchanged compared to previous EKG December 05, 2018.  ____________________________________________    RADIOLOGY  DG Chest 2 View  Result Date: 04/27/2019 CLINICAL DATA:  Chest pain EXAM: CHEST - 2 VIEW COMPARISON:  November 07, 2018 FINDINGS: Lungs are clear. Heart size and pulmonary vascularity are normal. No adenopathy. No pneumothorax. No bone lesions. IMPRESSION: Lungs clear.  Cardiac silhouette within normal limits. Electronically Signed   By: Lowella Grip III M.D.   On: 04/27/2019 09:46   DG Knee Complete 4 Views Left  Result Date: 04/27/2019 CLINICAL DATA:  Patient reports she had a syncopal episode on Saturday and fell onto left knee. Patient reports pain and bruising to left knee since. Also reports difficulty bearing weight. EXAM: LEFT KNEE - COMPLETE 4+ VIEW COMPARISON:  None. FINDINGS: No fracture or bone lesion. Knee joint is normally spaced and aligned. No arthropathic  changes. No joint effusion. Mild subcutaneous soft tissue edema is noted along the anterolateral knee. IMPRESSION: No fracture or joint abnormality Electronically Signed   By: Lajean Manes M.D.   On: 04/27/2019 12:28    ____________________________________________   PROCEDURES .Critical Care Performed by: Carrie Mew, MD Authorized by: Carrie Mew, MD   Critical care provider statement:    Critical care time (minutes):  35   Critical care time was exclusive of:  Separately billable procedures and treating other patients   Critical care was necessary to treat or prevent imminent or life-threatening deterioration of the following conditions:  Cardiac failure   Critical care was time spent personally by me on the following activities:  Development of treatment plan with patient or surrogate, discussions with consultants, evaluation of patient's response to  treatment, examination of patient, obtaining history from patient or surrogate, ordering and performing treatments and interventions, ordering and review of laboratory studies, ordering and review of radiographic studies, pulse oximetry, re-evaluation of patient's condition and review of old charts    ____________________________________________  DIFFERENTIAL DIAGNOSIS   Non-STEMI, unstable angina, pneumonia, doubt carditis dissection PE given normal vital signs except for hypertension.  CLINICAL IMPRESSION / ASSESSMENT AND PLAN / ED COURSE  Medications ordered in the ED: Medications  nitroGLYCERIN (NITROSTAT) SL tablet 0.4 mg (0.4 mg Sublingual Given 04/27/19 1211)  heparin ADULT infusion 100 units/mL (25000 units/210m sodium chloride 0.45%) (1,150 Units/hr Intravenous New Bag/Given 04/27/19 1229)  sodium chloride flush (NS) 0.9 % injection 3 mL (has no administration in time range)  acetaminophen (TYLENOL) tablet 1,000 mg (1,000 mg Oral Given 04/27/19 1147)  heparin bolus via infusion 4,000 Units (4,000 Units Intravenous  Bolus from Bag 04/27/19 1229)    Pertinent labs & imaging results that were available during my care of the patient were reviewed by me and considered in my medical decision making (see chart for details).  LDashae Wilcherwas evaluated in Emergency Department on 04/27/2019 for the symptoms described in the history of present illness. She was evaluated in the context of the global COVID-19 pandemic, which necessitated consideration that the patient might be at risk for infection with the SARS-CoV-2 virus that causes COVID-19. Institutional protocols and algorithms that pertain to the evaluation of patients at risk for COVID-19 are in a state of rapid change based on information released by regulatory bodies including the CDC and federal and state organizations. These policies and algorithms were followed during the patient's care in the ED.   Patient presents with worsening precordial chest pain described as heaviness.  Most concerning for unstable angina.  Will give nitroglycerin in the ED, start heparin infusion.  Her cardiology team was notified, Dr. KNehemiah Massedhas been to the ED to evaluate the patient already.  EKG is unchanged, troponin unremarkable, chest x-ray looks unremarkable without abnormal mediastinal silhouette.  Case discussed with the hospitalist for further management.  Cardiology consult note reviewed, who recommends continued nitrates as needed, heparin for cardioprotection, proceed with cardiac catheterization.  Screening Covid PCR test pending.      ____________________________________________   FINAL CLINICAL IMPRESSION(S) / ED DIAGNOSES    Final diagnoses:  Unstable angina (HCC)  Hypertension, unspecified type  Type 2 diabetes mellitus without complication, with long-term current use of insulin ([place]  H/O: stroke with residual effects     ED Discharge Orders    None      Portions of this note were generated with dragon dictation software. Dictation errors may  occur despite best attempts at proofreading.   SCarrie Mew MD 04/27/19 1243

## 2019-04-27 NOTE — H&P (Signed)
History and Physical    [name] ZOX:[id number] DOB: 01-07-1966 DOA: 04/27/2019  Referring MD/NP/PA:   PCP: Hubbard Hartshorn, [name]   Patient coming from:  The patient is coming from home.  At baseline, pt is independent for most of ADL.        Chief Complaint: chest pain  HPI: [name] is a 54 y.o. female with medical history significant of hypertension, hyperlipidemia, diabetes mellitus, COPD, asthma, stroke, GERD, depression with anxiety, OSA, seizure, kidney stone, CAD, myocardial infarction, left eye blindness, who presents with chest pain.  Patient states that she has been having intermittent chest pain for more than 2 weeks.  Patient has been followed up by cardiology at the clinic.  They scheduled cardiac cath, but not done yet.  Her chest pain is located in the left central chest, pressure-like, 10 out of 10 severity, exertional, radiating to the left shoulder.  She has mild shortness breath, mild dry cough, no fever or chills. Patient does not have nausea, vomiting, diarrhea, abdominal pain.  No symptoms of UTI or unilateral weakness.  Patient states that she fell accidentally 1 week ago, slightly injured her left knee.  No loss of consciousness.  No head or neck injury.  ED Course: pt was found to have troponin 10 -->7, WBC 5.1, pending COVID-19 PCR, electrolytes renal function okay, temperature normal, blood pressure 187/117, heart rate 82, oxygen saturation 99% on room air.  Chest x-ray negative.  Left knee x-rays negative for bony fracture.  Patient is admitted to progressive unit inpatient.  Cardiology, Dr. Nehemiah Massed is consulted.  Review of Systems:   General: no fevers, chills, no body weight gain, has fatigue HEENT: no blurry vision, hearing changes or sore throat Respiratory: has dyspnea, coughing, no wheezing CV: has chest pain, no palpitations GI: no nausea, vomiting, abdominal pain, diarrhea, constipation GU: no dysuria, burning on urination,  increased urinary frequency, hematuria  Ext: no leg edema Neuro: no unilateral weakness, numbness, or tingling, no vision change or hearing loss Skin: no rash, no skin tear. MSK: No muscle spasm, no deformity, no limitation of range of movement in spin. Has left knee pain. Heme: No easy bruising.  Travel history: No recent long distant travel.  Allergy:  Allergies  Allergen Reactions  . Codeine Swelling and Other (See Comments)    Swelling and burning of mouth (inside)  . Lactose Intolerance (Gi) Nausea And Vomiting    Past Medical History:  Diagnosis Date  . Anxiety and depression 09/13/2006   Qualifier: Diagnosis of  By: Hassell Done [name], Tori Milks    . Arthritis    joint pain   . Asthma   . COPD (chronic obstructive pulmonary disease) (HCC)    chronic bronchitis   . Depression   . Diabetes mellitus    since age 72; type 2 IDDM  . Diabetic neuropathy, painful ([place])    FEET AND HANDS  . Diabetic retinopathy ([place])   . Diastolic dysfunction    a.  Echo 11/17: EF 60-65%, mild LVH, no RWMA, Gr1DD, mild AI, dilated aortic root measuring 38 mm, mildly dilated ascending aorta  . Dilated aortic root ([place])    39m by echo 11/2015  . Dizziness    secondary to diabetes and hypertension   . Epilepsy idiopathic petit mal ([facility]    last seizure 2012;controlled w/ topomax  . GERD (gastroesophageal reflux disease)   . Glaucoma    NOT ON ANY EYE DROPS   . Headache(784.0)    migraines   .  Heart murmur    born with   . History of stress test    a. 11/17: Normal perfusion, EF 53%, normal study  . Hx of blood clots    hematomas removed from left side of brain from 35mo to 89yrold   . Hyperlipidemia   . Hypertension   . Legally blind    left eye   . MIGRAINE HEADACHE 09/14/2006   Qualifier: Diagnosis of  By: MaHassell DoneNP, NyTori Milks  . Myocardial infarction ([facility]   a.  Patient reported history of without objective documentation  . Nephrolithiasis    frequent urination , urination at nite   PT SEEN IN ER 09/22/11 FOR BACK AND RT SIDED PAIN--HAS KNOWN STONE RT URETER AND UA IN ER SHOWED UTI  . Pain 09/23/11   LOWER BACK AND RIGHT SIDE--PT HAS RIGHT URETERAL STONE  . Pneumonia    hx of 2009  . Pyelonephritis   . Seizures ([facility]  . Sleep apnea    [facility] ; mild  . Stress incontinence   . [facility]   last 2003  RESIDUAL LEFT LEG WEAKNESS--NO OTHER RESIDUAL PROBLEMS    Past Surgical History:  Procedure Laterality Date  . BRAIN HEMATOMA EVACUATION     five procedures total, first procedure when [age] monthsld, last at [age]ears of age.  . CYSTOSCOPY W/ RETROGRADES  09/24/2011   Procedure: CYSTOSCOPY WITH RETROGRADE PYELOGRAM;  Surgeon: JoMalka SoMD;  Location: [facility];  Service: Urology;  Laterality: Right;  . CYSTOSCOPY WITH URETEROSCOPY  09/24/2011   Procedure: CYSTOSCOPY WITH URETEROSCOPY;  Surgeon: JoMalka SoMD;  Location: [facility];  Service: Urology;  Laterality: Right;  Balloon dilation right ureter   . CYSTOSCOPY/RETROGRADE/URETEROSCOPY  08/24/2011   Procedure: CYSTOSCOPY/RETROGRADE/URETEROSCOPY;  Surgeon: DaMolli HazardMD;  Location: [facility];  Service: Urology;  Laterality: Right;  Cysto, Right retrograde Pyelogram, right stent placement.   . Fatima BlankERNIA REPAIR  05/05/2011   Procedure: LAPAROSCOPIC INCISIONAL HERNIA;  Surgeon: MaRolm BookbinderMD;  Location: [facility] Service: General;  Laterality: N/A;  . kidney stone removal    . MASS EXCISION  05/05/2011   Procedure: EXCISION MASS;  Surgeon: MaRolm BookbinderMD;  [facility] Service: General;  Laterality: Right;  . PCNL    . RETINAL DETACHMENT SURGERY Left 1990  . SHUNT REMOVAL     shunt inserted at age 50 53emoved at age 54 . VAGINAL HYSTERECTOMY  1996    Social History:  reports that she quit smoking about 32 years ago. Her smoking use included cigarettes. She has a 15.00 pack-year smoking history. She has never used smokeless tobacco. She reports that  she does not drink alcohol or use drugs.  Family History:  Family History  Problem Relation Age of Onset  . Uterine cancer Mother   . Hypertension Mother   . Cancer Mother   . Brain cancer Maternal Grandmother   . Hypertension Maternal Grandmother   . Birth defects Daughter   . Hypertension Daughter   . Cirrhosis Maternal Grandfather   . ADD / ADHD Maternal Grandfather   . Birth defects Maternal Grandfather   . Diabetes Maternal Grandfather   . Anesthesia problems Neg Hx   . Colon cancer Neg Hx   . Esophageal cancer Neg Hx   . Pancreatic cancer Neg Hx      Prior to Admission medications   Medication Sig  Start Date End Date Taking? Authorizing Provider  albuterol (ACCUNEB) 1.25 MG/3ML nebulizer solution Inhale into the lungs. Use 3 mls by nebulization every 6 hours as needed for wheezing 01/26/19 01/26/20  [provider]  albuterol (PROAIR HFA) 108 (90 Base) MCG/ACT inhaler INHALE 2 PUFFS BY MOUTH EVERY 6 HOURS AS NEEDED FOR WHEEZING OR SHORTNESS OF BREATH 01/24/18   Hubbard Hartshorn, [name]  AMBULATORY NON FORMULARY MEDICATION Medication Name: Muscle and Joint Vanishing Scent Gel- Apply as needed to skin    [provider]  amLODipine (NORVASC) 5 MG tablet Take 1 tablet (5 mg total) by mouth daily. Patient not taking: Reported on 04/26/2019 10/12/18   Delsa Grana, [name]  baclofen (LIORESAL) 10 MG tablet Take 1 tablet (10 mg total) by mouth 3 (three) times daily. 07/21/18   Hubbard Hartshorn, [name]  Blood Glucose Monitoring Suppl (ACCU-CHEK AVIVA PLUS) w/Device KIT 1 each by XX route as directed for up to 1 day 10/17/18   [provider]  budesonide (PULMICORT) 0.5 MG/2ML nebulizer solution Inhale 0.5 mg into the lungs daily. Take 2 mls by nebulizations once daily  01/26/19 01/26/20  [provider]  Calcium Carbonate-Vitamin D (CALCIUM-VITAMIN D3 PO) D3 500- calcium 630 mg Take 1 capsule by mouth once daily    [provider]  carvedilol (COREG) 3.125 MG tablet  Take 3.125 mg by mouth 2 (two) times daily.  01/23/19 01/23/20  [provider]  clopidogrel (PLAVIX) 75 MG tablet Take 1 tablet (75 mg total) by mouth daily. 10/07/18   Steele Sizer, MD  cycloSPORINE (RESTASIS) 0.05 % ophthalmic emulsion Place 2 drops into both eyes 2 (two) times daily.    [provider]  Dulaglutide 1.5 MG/0.5ML SOPN Inject 1.5 mg into the skin once a week. 10/20/18   Hubbard Hartshorn, [name]  DULoxetine (CYMBALTA) 20 MG capsule Take 2 capsules (40 mg total) by mouth daily. 04/19/19   Gillis Santa, MD  DUPIXENT 300 MG/2ML SOPN Inject 300 mg into the skin every 14 (fourteen) days.  04/04/19   [provider]  EPINEPHrine 0.3 mg/0.3 mL IJ SOAJ injection Inject 0.3 mg into the muscle as needed for anaphylaxis.  03/29/19   [provider]  ezetimibe (ZETIA) 10 MG tablet Take 1 tablet (10 mg total) by mouth daily. 04/18/19   Towanda Malkin, MD  fluorometholone (FML) 0.1 % ophthalmic suspension Place 2 drops into both eyes 2 (two) times daily.    [provider]  fluticasone (FLONASE) 50 MCG/ACT nasal spray Place 2 sprays into both nostrils daily. 04/18/19   Towanda Malkin, MD  Fluticasone-Salmeterol (WIXELA INHUB) 100-50 MCG/DOSE AEPB Inhale 1 puff into the lungs 2 (two) times daily.    [provider]  gabapentin (NEURONTIN) 400 MG capsule 400 mg qAM, 400 mg qPM, 800 mg qhs 04/19/19   Gillis Santa, MD  glucose blood (ACCU-CHEK AVIVA PLUS) test strip Use 1 each (1 strip total) 4 (four) times daily for 90 days Use as instructed. 10/17/18   [provider]  insulin aspart (NOVOLOG FLEXPEN) 100 UNIT/ML FlexPen Inject 18-50 Units into the skin 3 (three) times daily with meals.  06/08/18 06/08/19  [provider]  Insulin Glargine, 2 Unit Dial, 300 UNIT/ML SOPN Inject 120 Units into the skin daily. Patient taking differently: Inject 136 Units into the skin daily.  03/30/18   Hubbard Hartshorn, [name]  Insulin Pen Needle  (EASY TOUCH PEN NEEDLES) 31G X 8 MM MISC Use as directed in  giving insulin. Dx: E11.29 03/25/18   Hubbard Hartshorn, [name]  levETIRAcetam (KEPPRA) 500 MG tablet Take 500 mg by mouth 2 (two) times daily. 07/08/18   [provider]  losartan (COZAAR) 100 MG tablet Take 1 tablet (100 mg total) by mouth daily. 07/21/18   Hubbard Hartshorn, [name]  mirabegron ER (MYRBETRIQ) 50 MG TB24 tablet Take 1 tablet (50 mg total) by mouth daily. 04/18/19   Towanda Malkin, MD  ondansetron (ZOFRAN ODT) 4 MG disintegrating tablet Take 1 tablet (4 mg total) by mouth every 8 (eight) hours as needed for nausea or vomiting. 04/18/19   Towanda Malkin, MD  [facility] DELICA LANCETS 84Z MISC Use to check sugar three times daily as directed.Dx: E11.29; E11.40 10/21/16   Gildardo Cranker, DO  oxyCODONE-acetaminophen (PERCOCET) 5-325 MG tablet Take 1 tablet by mouth every 12 (twelve) hours as needed for severe pain. 04/19/19 05/19/19  Gillis Santa, MD  pantoprazole (PROTONIX) 40 MG tablet Take 1 tablet (40 mg total) by mouth daily. 07/21/18   Hubbard Hartshorn, [name]  polyethylene glycol (MIRALAX / GLYCOLAX) packet Take 17 g by mouth daily as needed for mild constipation. 07/30/16   Lauree Chandler, NP  potassium chloride SA (KLOR-CON M20) 20 MEQ tablet Take 2 tablets (40 mEq total) by mouth daily. 03/01/19   Towanda Malkin, MD  predniSONE (DELTASONE) 20 MG tablet Take 20 mg by mouth daily.  01/26/19   [provider]  simvastatin (ZOCOR) 20 MG tablet Take 1 tablet (20 mg total) by mouth at bedtime. 04/18/19   Towanda Malkin, MD  spironolactone (ALDACTONE) 25 MG tablet Take 1 tablet by mouth daily. 01/23/19   [provider]  terconazole (TERAZOL 7) 0.4 % vaginal cream Place 1 applicator vaginally at bedtime as needed (irritation).    [provider]    Physical Exam: Vitals:   04/27/19 1330 04/27/19 1400 04/27/19 1422 04/27/19 1600  BP: (!) 196/120 (!) 205/106 (!) 134/102 (!) 171/98    Pulse: 79 92  81  Resp: 14 15  (!) 23  Temp:      TempSrc:      SpO2: 94% 96%  98%  Weight:      Height:       General: Not in acute distress HEENT:       Eyes: Right eye PERRL, EOMI, no scleral icterus. Has left eye blindness       ENT: No discharge from the ears and nose, no pharynx injection, no tonsillar enlargement.        Neck: No JVD, no bruit, no mass felt. Heme: No neck lymph node enlargement. Cardiac: S1/S2, RRR, has murmurs, No gallops or rubs. Respiratory: No rales, wheezing, rhonchi or rubs. GI: Soft, nondistended, nontender, no rebound pain, no organomegaly, BS present. GU: No hematuria Ext: No pitting leg edema bilaterally. 2+DP/PT pulse bilaterally. Musculoskeletal: No joint deformities, No joint redness or warmth, no limitation of ROM in spin. Skin: No rashes.  Neuro: Alert, oriented X3, cranial nerves II-XII grossly intact, moves all extremities normally.  Psych: Patient is not psychotic, no suicidal or hemocidal ideation.  Labs on Admission: I have personally reviewed following labs and imaging studies  CBC: Recent Labs  Lab 04/27/19 0903  WBC 5.1  HGB 12.2  HCT 39.0  MCV 95.4  PLT 660   Basic Metabolic Panel: Recent Labs  Lab 04/27/19 0903  NA 140  K 4.0  CL 104  CO2 28  GLUCOSE 197*  BUN 13  CREATININE 0.94  CALCIUM 9.1   GFR: Estimated Creatinine Clearance: 82.3 mL/min (by C-G formula based on SCr of 0.94 mg/dL). Liver Function Tests: No results for input(s): AST, ALT, ALKPHOS, BILITOT, PROT, ALBUMIN in the last 168 hours. No results for input(s): LIPASE, AMYLASE in the last 168 hours. No results for input(s): AMMONIA in the last 168 hours. Coagulation Profile: Recent Labs  Lab 04/27/19 1213  INR 1.0   Cardiac Enzymes: No results for input(s): CKTOTAL, CKMB, CKMBINDEX, TROPONINI in the last 168 hours. BNP (last 3 results) No results for input(s): PROBNP in the last 8760 hours. HbA1C: No results for input(s): HGBA1C in the last  72 hours. CBG: Recent Labs  Lab 04/27/19 1348  GLUCAP 129*   Lipid Profile: No results for input(s): CHOL, HDL, LDLCALC, TRIG, CHOLHDL, LDLDIRECT in the last 72 hours. Thyroid Function Tests: No results for input(s): TSH, T4TOTAL, FREET4, T3FREE, THYROIDAB in the last 72 hours. Anemia Panel: No results for input(s): VITAMINB12, FOLATE, FERRITIN, TIBC, IRON, RETICCTPCT in the last 72 hours. Urine analysis:    Component Value Date/Time   COLORURINE YELLOW (A) 12/05/2018 1310   APPEARANCEUR HAZY (A) 12/05/2018 1310   LABSPEC 1.021 12/05/2018 1310   PHURINE 5.0 12/05/2018 1310   GLUCOSEU 50 (A) 12/05/2018 1310   HGBUR NEGATIVE 12/05/2018 1310   HGBUR negative 09/30/2006 1400   BILIRUBINUR NEGATIVE 12/05/2018 1310   BILIRUBINUR neg 11/11/2016 1214   KETONESUR NEGATIVE 12/05/2018 1310   PROTEINUR 30 (A) 12/05/2018 1310   UROBILINOGEN 0.2 11/11/2016 1214   UROBILINOGEN 0.2 06/18/2014 0028   NITRITE NEGATIVE 12/05/2018 1310   LEUKOCYTESUR LARGE (A) 12/05/2018 1310   Sepsis Labs: _0 (procalcitonin:4,lacticidven:4) ) Recent Results (from the past 240 hour(s))  Respiratory Panel by RT PCR (Flu A&B, Covid) - Nasopharyngeal Swab     Status: None   Collection Time: 04/27/19 11:50 AM   Specimen: Nasopharyngeal Swab  Result Value Ref Range Status   SARS Coronavirus 2 by RT PCR NEGATIVE NEGATIVE Final    Comment: (NOTE) SARS-CoV-2 target nucleic acids are NOT DETECTED. The SARS-CoV-2 RNA is generally detectable in upper respiratoy specimens during the acute phase of infection. The lowest concentration of SARS-CoV-2 viral copies this assay can detect is 131 copies/mL. A negative result does not preclude SARS-Cov-2 infection and should not be used as the sole basis for treatment or other patient management decisions. A negative result may occur with  improper specimen collection/handling, submission of specimen other than nasopharyngeal swab, presence of viral mutation(s) within  the areas targeted by this assay, and inadequate number of viral copies (<131 copies/mL). A negative result must be combined with clinical observations, patient history, and epidemiological information. The expected result is Negative. Fact Sheet for Patients:  PinkCheek.be Fact Sheet for Healthcare Providers:  GravelBags.it This test is not yet ap proved or cleared by the [place] FDA and  has been authorized for detection and/or diagnosis of SARS-CoV-2 by FDA under an Emergency Use Authorization (EUA). This EUA will remain  in effect (meaning this test can be used) for the duration of the COVID-19 declaration under Section 564(b)(1) of the Act, 21 U.S.C. section 360bbb-3(b)(1), unless the authorization is terminated or revoked sooner.    Influenza A by PCR NEGATIVE NEGATIVE Final   Influenza B by PCR NEGATIVE NEGATIVE Final    Comment: (NOTE) The Xpert Xpress SARS-CoV-2/FLU/RSV assay is intended as an aid in  the diagnosis of influenza from Nasopharyngeal swab specimens and  should not be used as a sole  basis for treatment. Nasal washings and  aspirates are unacceptable for Xpert Xpress SARS-CoV-2/FLU/RSV  testing. Fact Sheet for Patients: PinkCheek.be Fact Sheet for Healthcare Providers: GravelBags.it This test is not yet approved or cleared by the [place] FDA and  has been authorized for detection and/or diagnosis of SARS-CoV-2 by  FDA under an Emergency Use Authorization (EUA). This EUA will remain  in effect (meaning this test can be used) for the duration of the  Covid-19 declaration under Section 564(b)(1) of the Act, 21  U.S.C. section 360bbb-3(b)(1), unless the authorization is  terminated or revoked. Performed at [facility], [address], [place], [place] [address]      Radiological Exams on Admission: DG Chest 2 View  Result  Date: 04/27/2019 CLINICAL DATA:  Chest pain EXAM: CHEST - 2 VIEW COMPARISON:  November 07, 2018 FINDINGS: Lungs are clear. Heart size and pulmonary vascularity are normal. No adenopathy. No pneumothorax. No bone lesions. IMPRESSION: Lungs clear.  Cardiac silhouette within normal limits. Electronically Signed   By: Lowella Grip III M.D.   On: 04/27/2019 09:46   DG Knee Complete 4 Views Left  Result Date: 04/27/2019 CLINICAL DATA:  Patient reports she had a syncopal episode on Saturday and fell onto left knee. Patient reports pain and bruising to left knee since. Also reports difficulty bearing weight. EXAM: LEFT KNEE - COMPLETE 4+ VIEW COMPARISON:  None. FINDINGS: No fracture or bone lesion. Knee joint is normally spaced and aligned. No arthropathic changes. No joint effusion. Mild subcutaneous soft tissue edema is noted along the anterolateral knee. IMPRESSION: No fracture or joint abnormality Electronically Signed   By: Lajean Manes M.D.   On: 04/27/2019 12:28     EKG: Independently reviewed.  Sinus rhythm, QTC 464, LAE, LAD, poor R wave progression  Assessment/Plan Principal Problem:   Chest pain Active Problems:   Hyperlipemia   Essential hypertension   GERD   Seizure disorder (HCC)   History of CVA (cerebrovascular accident)   Type 2 diabetes mellitus without complication, with long-term current use of insulin (HCC)   COPD (chronic obstructive pulmonary disease) (HCC)   CAD (coronary artery disease)   Chest pain, hx of CAD: Patient has been having chest pain intermittently for more than 2 weeks, suspecting unstable angina.  Troponin negative so far.  Cardiology, Dr. Nehemiah Massed is consulted, planning to do cardiac cath.  - will admit to progressive unit as inpatient - IV heparin - Trend Trop - Repeat EKG in the am  - prn Nitroglycerin, Morphine, Plavix, Zocor, Zetia - Risk factor stratification: will check FLP and A1C  - check UDS - 2d echo  Hyperlipemia -Zocor and  Zetia  Essential hypertension: -prn Hydralazine -Continue home medications: Coreg, Cozaar, spironolactone  GERD -Protonix  Seizure -Seizure precaution -When necessary Ativan for seizure -Continue Home medications: Keppra  History of CVA (cerebrovascular accident) -plavix and Zocor, Zetia  Type 2 diabetes mellitus without complication, with long-term current use of insulin ([place]): Most recent A1c 12.6, poorly controled. Patient is taking NovoLog, dulaglutide, glargine insulin at home -will decrease glargine insulin dose from 136 to 100 units daily -SSI  COPD (chronic obstructive pulmonary disease) ([place]): -Bronchodilators    Inpatient status:  # Patient requires inpatient status due to high intensity of service, high risk for further deterioration and high frequency of surveillance required.  I certify that at the point of admission it is my clinical judgment that the patient will require inpatient hospital care spanning beyond 2 midnights from the  point of admission.  . This patient has multiple chronic comorbidities including hypertension, hyperlipidemia, diabetes mellitus, COPD, asthma, stroke, GERD, depression with anxiety, OSA, seizure, kidney stone, CAD, myocardial infarction, left eye blindness . Now patient has presenting with chest pain, suspecting unstable angina . Current medical needs: please see my assessment and plan . Predictability of an adverse outcome (risk): Patient has multiple comorbidities as listed above. Now presents with chest pain, suspecting unstable angina. Pt will need cardiac cath. Patient's presentation is highly complicated.  Patient is at high risk of deteriorating.  Will need to be treated in hospital for at least 2 days.            DVT ppx: on IV Heparin  Code Status: Full code Family Communication: not done, no family member is at bed side.    Disposition Plan:  Anticipate discharge back to previous home environment Consults called:   Dr. Nehemiah Massed of card Admission status: Progressive unit as inpatient   Date of Service 04/27/2019    [facility]   If 7PM-7AM, please contact night-coverage [email or web address] 04/27/2019, 5:20 PM

## 2019-04-27 NOTE — Consult Note (Signed)
[place] for Heparin Indication: chest pain/ACS  Allergies  Allergen Reactions  . Codeine Swelling and Other (See Comments)    Swelling and burning of mouth (inside)  . Lactose Intolerance (Gi) Nausea And Vomiting    Patient Measurements: Height: 5' 6"  (167.6 cm) Weight: 101.6 kg (224 lb) IBW/kg (Calculated) : 59.3 Heparin Dosing Weight: 82.4 kg  Vital Signs: Temp: 98.2 F (36.8 C) (04/08 0903) Temp Source: Oral (04/08 0903) BP: 187/106 (04/08 0903) Pulse Rate: 93 (04/08 0903)  Labs: Recent Labs    04/27/19 0903  HGB 12.2  HCT 39.0  PLT 242  CREATININE 0.94  TROPONINIHS 10    Estimated Creatinine Clearance: 82.3 mL/min (by C-G formula based on SCr of 0.94 mg/dL).   Medical History: Past Medical History:  Diagnosis Date  . Anxiety and depression 09/13/2006   Qualifier: Diagnosis of  By: Hassell Done [name], Tori Milks    . Arthritis    joint pain   . Asthma   . COPD (chronic obstructive pulmonary disease) (HCC)    chronic bronchitis   . Depression   . Diabetes mellitus    since age 16; type 2 IDDM  . Diabetic neuropathy, painful ([place])    FEET AND HANDS  . Diabetic retinopathy ([place])   . Diastolic dysfunction    a.  Echo 11/17: EF 60-65%, mild LVH, no RWMA, Gr1DD, mild AI, dilated aortic root measuring 38 mm, mildly dilated ascending aorta  . Dilated aortic root ([place])    12m by echo 11/2015  . Dizziness    secondary to diabetes and hypertension   . Epilepsy idiopathic petit mal ([place]    last seizure 2012;controlled w/ topomax  . GERD (gastroesophageal reflux disease)   . Glaucoma    NOT ON ANY EYE DROPS   . Headache(784.0)    migraines   . Heart murmur    born with   . History of stress test    a. 11/17: Normal perfusion, EF 53%, normal study  . Hx of blood clots    hematomas removed from left side of brain from 172moto 5y74yrld   . Hyperlipidemia   . Hypertension   . Legally blind    left eye   . MIGRAINE  HEADACHE 09/14/2006   Qualifier: Diagnosis of  By: MarHassell DoneP, NykTori Milks . Myocardial infarction ([facility] [place]  a.  Patient reported history of without objective documentation  . Nephrolithiasis    frequent urination , urination at nite  PT SEEN IN ER 09/22/11 FOR BACK AND RT SIDED PAIN--HAS KNOWN STONE RT URETER AND UA IN ER SHOWED UTI  . Pain 09/23/11   LOWER BACK AND RIGHT SIDE--PT HAS RIGHT URETERAL STONE  . Pneumonia    hx of 2009  . Pyelonephritis   . Seizures ([facility] . [facility] ap[facility]    [facility] ; mild  . Stress incontinence   . [facility]  [facility]  RESIDUAL LEFT LEG WEAKNESS--NO OTHER RESIDUAL PROBLEMS    Medications:  (Not in a hospital admission)  Scheduled:  . acetaminophen  1,000 mg Oral Once   Infusions:   PRN: nitroGLYCERIN Anti-infectives (From admission, onward)   None      Assessment: Pharmacy consulted to start heparin for ACS. No DOAC noted.   Goal of Therapy:  Heparin level 0.3-0.7 units/ml Monitor platelets by anticoagulation protocol: Yes   Plan:  Give 4000 units bolus  x 1 Start heparin infusion at 1150 units/hr Check anti-Xa level in 6 hours and daily while on heparin Continue to monitor H&H and platelets  Oswald Hillock, PharmD, BCPS 04/27/2019,11:43 AM

## 2019-04-27 NOTE — ED Notes (Addendum)
ED TO INPATIENT HANDOFF REPORT  ED Nurse Name and Phone #:  Gershon Mussel RN  [phone]  S Name/Age/Gender [name] 54 y.o. female Room/Bed: ED52A/ED52A  Code Status   Code Status: Full Code  Home/SNF/Other Home Patient oriented to: self, place, time and situation Is this baseline? Yes   Triage Complete: Triage complete  Chief Complaint Chest pain [R07.9]  Triage Note Pt here for CP.  Saw cardiologist 2 weeks ago and had been having CP then; scheduled for procedure this month for a blockage per pt.  Pain is getting worse.  Pain is to left chest and radiates to left arm.  HX of MI.  Unlabored. Color WNL.  No diaphoresis at this time.      Allergies Allergies  Allergen Reactions  . Codeine Swelling and Other (See Comments)    Swelling and burning of mouth (inside)  . Lactose Intolerance (Gi) Nausea And Vomiting    Level of Care/Admitting Diagnosis ED Disposition    ED Disposition Condition [facility] Area: [place] [[id number]]  Level of Care: Progressive Cardiac [106]  Admit to Progressive based on following criteria: Other see comments  Comments: Unstable angina  Covid Evaluation: Asymptomatic Screening Protocol (No Symptoms)  Diagnosis: Chest pain [454098]  Admitting Physician: Ivor Costa [[id number]]  Attending Physician: Ivor Costa [[id number]]  Estimated length of stay: past midnight tomorrow  Certification:: I certify this patient will need inpatient services for at least 2 midnights       B Medical/Surgery History Past Medical History:  Diagnosis Date  . Anxiety and depression 09/13/2006   Qualifier: Diagnosis of  By: Hassell Done [name], Tori Milks    . Arthritis    joint pain   . Asthma   . COPD (chronic obstructive pulmonary disease) (HCC)    chronic bronchitis   . Depression   . Diabetes mellitus    since age 24; type 2 IDDM  . Diabetic neuropathy, painful ([place])    FEET AND HANDS  . Diabetic retinopathy ([place])   . Diastolic  dysfunction    a.  Echo 11/17: EF 60-65%, mild LVH, no RWMA, Gr1DD, mild AI, dilated aortic root measuring 38 mm, mildly dilated ascending aorta  . Dilated aortic root ([place])    20m by echo 11/2015  . Dizziness    secondary to diabetes and hypertension   . Epilepsy idiopathic petit mal ([place]    last seizure 2012;controlled w/ topomax  . GERD (gastroesophageal reflux disease)   . Glaucoma    NOT ON ANY EYE DROPS   . Headache(784.0)    migraines   . Heart murmur    born with   . History of stress test    a. 11/17: Normal perfusion, EF 53%, normal study  . Hx of blood clots    hematomas removed from left side of brain from 120moto 5y82yrld   . Hyperlipidemia   . Hypertension   . Legally blind    left eye   . MIGRAINE HEADACHE 09/14/2006   Qualifier: Diagnosis of  By: MarHassell DoneP, NykTori Milks . Myocardial infarction ([facility]CC[facility]  a.  Patient reported history of without objective documentation  . Nephrolithiasis    frequent urination , urination at nite  PT SEEN IN ER 09/22/11 FOR BACK AND RT SIDED PAIN--HAS KNOWN STONE RT URETER AND UA IN ER SHOWED UTI  . Pain 09/23/11   LOWER BACK AND RIGHT SIDE--PT HAS RIGHT URETERAL STONE  .  Pneumonia    hx of 2009  . Pyelonephritis   . Seizures ([place])   . Sleep apnea    [facility] ; mild  . Stress incontinence   . Stroke [facility])    last 2003  RESIDUAL LEFT LEG WEAKNESS--NO OTHER RESIDUAL PROBLEMS   Past Surgical History:  Procedure Laterality Date  . BRAIN HEMATOMA EVACUATION     five procedures total, first procedure when 93 months old, last at 54 years of age.  . CYSTOSCOPY W/ RETROGRADES  09/24/2011   Procedure: CYSTOSCOPY WITH RETROGRADE PYELOGRAM;  Surgeon: Malka So, MD;  Location: [facility];  Service: Urology;  Laterality: Right;  . CYSTOSCOPY WITH URETEROSCOPY  09/24/2011   Procedure: CYSTOSCOPY WITH URETEROSCOPY;  Surgeon: Malka So, MD;  Location: [facility];  Service: Urology;  Laterality: Right;  Balloon  dilation right ureter   . CYSTOSCOPY/RETROGRADE/URETEROSCOPY  08/24/2011   Procedure: CYSTOSCOPY/RETROGRADE/URETEROSCOPY;  Surgeon: Molli Hazard, MD;  Location: [facility];  Service: Urology;  Laterality: Right;  Cysto, Right retrograde Pyelogram, right stent placement.   Fatima Blank HERNIA REPAIR  05/05/2011   Procedure: LAPAROSCOPIC INCISIONAL HERNIA;  Surgeon: Rolm Bookbinder, MD;  Location: [place];  Service: General;  Laterality: N/A;  . kidney stone removal    . MASS EXCISION  05/05/2011   Procedure: EXCISION MASS;  Surgeon: Rolm Bookbinder, MD;  Location: [place];  Service: General;  Laterality: Right;  . PCNL    . RETINAL DETACHMENT SURGERY Left 1990  . SHUNT REMOVAL     shunt inserted at age 19 removed at age 16   . VAGINAL HYSTERECTOMY  1996     A IV Location/Drains/Wounds Patient Lines/Drains/Airways Status   Active Line/Drains/Airways    Name:   Placement date:   Placement time:   Site:   Days:   Peripheral IV 04/27/19 Right Antecubital   04/27/19    1127    Antecubital   less than 1   Peripheral IV 04/27/19 Left Antecubital   04/27/19    1221    Antecubital   less than 1   Ureteral Drain/Stent Right ureter 8 Fr.   09/24/11    --    Right ureter   2772   External Urinary Catheter   04/27/19    1210    --   less than 1   Incision 08/24/11 Perineum   08/24/11    1506     2803   Incision 09/24/11 N/A   09/24/11    1234     2772          Intake/Output Last 24 hours No intake or output data in the 24 hours ending 04/27/19 2029  Labs/Imaging Results for orders placed or performed during the hospital encounter of 04/27/19 (from the past 48 hour(s))  Basic metabolic panel     Status: Abnormal   Collection Time: 04/27/19  9:03 AM  Result Value Ref Range   Sodium 140 135 - 145 mmol/L   Potassium 4.0 3.5 - 5.1 mmol/L   Chloride 104 98 - 111 mmol/L   CO2 28 22 - 32 mmol/L   Glucose, Bld 197 (H) 70 - 99 mg/dL    Comment: Glucose reference range applies only to samples  taken after fasting for at least 8 hours.   BUN 13 6 - 20 mg/dL   Creatinine, Ser 0.94 0.44 - 1.00 mg/dL   Calcium 9.1 8.9 - 10.3 mg/dL   GFR calc non  Af Amer >60 >60 mL/min   GFR calc Af Amer >60 >60 mL/min   Anion gap 8 5 - 15    Comment: Performed at [facility], [address] [place], [place] [address]  CBC     Status: None   Collection Time: 04/27/19  9:03 AM  Result Value Ref Range   WBC 5.1 4.0 - 10.5 K/uL   RBC 4.09 3.87 - 5.11 MIL/uL   Hemoglobin 12.2 12.0 - 15.0 g/dL   HCT 39.0 36.0 - 46.0 %   MCV 95.4 80.0 - 100.0 fL   MCH 29.8 26.0 - 34.0 pg   MCHC 31.3 30.0 - 36.0 g/dL   RDW 13.8 11.5 - 15.5 %   Platelets 242 150 - 400 K/uL   nRBC 0.0 0.0 - 0.2 %    Comment: Performed at [facility], [address] [place], [place] [address]  Troponin I (High Sensitivity)     Status: None   Collection Time: 04/27/19  9:03 AM  Result Value Ref Range   Troponin I (High Sensitivity) 10 <18 ng/L    Comment: (NOTE) Elevated high sensitivity troponin I (hsTnI) values and significant  changes across serial measurements may suggest ACS but many other  chronic and acute conditions are known to elevate hsTnI results.  Refer to the "Links" section for chest pain algorithms and additional  guidance. Performed at [facility], [place], [place] [address]   Troponin I (High Sensitivity)     Status: None   Collection Time: 04/27/19 11:22 AM  Result Value Ref Range   Troponin I (High Sensitivity) 7 <18 ng/L    Comment: (NOTE) Elevated high sensitivity troponin I (hsTnI) values and significant  changes across serial measurements may suggest ACS but many other  chronic and acute conditions are known to elevate hsTnI results.  Refer to the "Links" section for chest pain algorithms and additional  guidance. Performed at [facility], [address], [place], [place] [address]   Respiratory Panel by RT PCR (Flu A&B, Covid) -  Nasopharyngeal Swab     Status: None   Collection Time: 04/27/19 11:50 AM   Specimen: Nasopharyngeal Swab  Result Value Ref Range   SARS Coronavirus 2 by RT PCR NEGATIVE NEGATIVE    Comment: (NOTE) SARS-CoV-2 target nucleic acids are NOT DETECTED. The SARS-CoV-2 RNA is generally detectable in upper respiratoy specimens during the acute phase of infection. The lowest concentration of SARS-CoV-2 viral copies this assay can detect is 131 copies/mL. A negative result does not preclude SARS-Cov-2 infection and should not be used as the sole basis for treatment or other patient management decisions. A negative result may occur with  improper specimen collection/handling, submission of specimen other than nasopharyngeal swab, presence of viral mutation(s) within the areas targeted by this assay, and inadequate number of viral copies (<131 copies/mL). A negative result must be combined with clinical observations, patient history, and epidemiological information. The expected result is Negative. Fact Sheet for Patients:  PinkCheek.be Fact Sheet for Healthcare Providers:  GravelBags.it This test is not yet ap proved or cleared by the [place] FDA and  has been authorized for detection and/or diagnosis of SARS-CoV-2 by FDA under an Emergency Use Authorization (EUA). This EUA will remain  in effect (meaning this test can be used) for the duration of the COVID-19 declaration under Section 564(b)(1) of the Act, 21 U.S.C. section 360bbb-3(b)(1), unless the authorization is terminated or revoked sooner.  Influenza A by PCR NEGATIVE NEGATIVE   Influenza B by PCR NEGATIVE NEGATIVE    Comment: (NOTE) The Xpert Xpress SARS-CoV-2/FLU/RSV assay is intended as an aid in  the diagnosis of influenza from Nasopharyngeal swab specimens and  should not be used as a sole basis for treatment. Nasal washings and  aspirates are unacceptable for  Xpert Xpress SARS-CoV-2/FLU/RSV  testing. Fact Sheet for Patients: PinkCheek.be Fact Sheet for Healthcare Providers: GravelBags.it This test is not yet approved or cleared by the [place] FDA and  has been authorized for detection and/or diagnosis of SARS-CoV-2 by  FDA under an Emergency Use Authorization (EUA). This EUA will remain  in effect (meaning this test can be used) for the duration of the  Covid-19 declaration under Section 564(b)(1) of the Act, 21  U.S.C. section 360bbb-3(b)(1), unless the authorization is  terminated or revoked. Performed at [facility], [address] [place], [place] [address]   Protime-INR     Status: None   Collection Time: 04/27/19 12:13 PM  Result Value Ref Range   Prothrombin Time 13.0 11.4 - 15.2 seconds   INR 1.0 0.8 - 1.2    Comment: (NOTE) INR goal varies based on device and disease states. Performed at [facility], [address] [place], [place] [address]   APTT     Status: None   Collection Time: 04/27/19 12:13 PM  Result Value Ref Range   aPTT 27 24 - 36 seconds    Comment: Performed at [facility], [address] [place], [place] [address]  Glucose, capillary     Status: Abnormal   Collection Time: 04/27/19  1:48 PM  Result Value Ref Range   Glucose-Capillary 129 (H) 70 - 99 mg/dL    Comment: Glucose reference range applies only to samples taken after fasting for at least 8 hours.  Troponin I (High Sensitivity)     Status: None   Collection Time: 04/27/19  1:52 PM  Result Value Ref Range   Troponin I (High Sensitivity) 9 <18 ng/L    Comment: (NOTE) Elevated high sensitivity troponin I (hsTnI) values and significant  changes across serial measurements may suggest ACS but many other  chronic and acute conditions are known to elevate hsTnI results.  Refer to the "Links" section for chest pain algorithms and additional   guidance. Performed at [facility], [address] [place], [place] [address]   Urine Drug Screen, Qualitative [facility] only)     Status: None   Collection Time: 04/27/19  1:52 PM  Result Value Ref Range   Tricyclic, Ur Screen NONE DETECTED NONE DETECTED   Amphetamines, Ur Screen NONE DETECTED NONE DETECTED   MDMA (Ecstasy)Ur Screen NONE DETECTED NONE DETECTED   Cocaine Metabolite,Ur [place] NONE DETECTED NONE DETECTED   Opiate, Ur Screen NONE DETECTED NONE DETECTED   Phencyclidine (PCP) Ur S NONE DETECTED NONE DETECTED   Cannabinoid 50 Ng, Ur [place] NONE DETECTED NONE DETECTED   Barbiturates, Ur Screen NONE DETECTED NONE DETECTED   Benzodiazepine, Ur Scrn NONE DETECTED NONE DETECTED   Methadone Scn, Ur NONE DETECTED NONE DETECTED    Comment: (NOTE) Tricyclics + metabolites, urine    Cutoff 1000 ng/mL Amphetamines + metabolites, urine  Cutoff 1000 ng/mL MDMA (Ecstasy), urine              Cutoff 500 ng/mL Cocaine Metabolite, urine          Cutoff 300 ng/mL Opiate + metabolites, urine  Cutoff 300 ng/mL Phencyclidine (PCP), urine         Cutoff 25 ng/mL Cannabinoid, urine                 Cutoff 50 ng/mL Barbiturates + metabolites, urine  Cutoff 200 ng/mL Benzodiazepine, urine              Cutoff 200 ng/mL Methadone, urine                   Cutoff 300 ng/mL The urine drug screen provides only a preliminary, unconfirmed analytical test result and should not be used for non-medical purposes. Clinical consideration and professional judgment should be applied to any positive drug screen result due to possible interfering substances. A more specific alternate chemical method must be used in order to obtain a confirmed analytical result. Gas chromatography / mass spectrometry (GC/MS) is the preferred confirmat ory method. Performed at [facility], [address] [place], [place] [address]    DG Chest 2 View  Result Date: 04/27/2019 CLINICAL DATA:  Chest pain EXAM:  CHEST - 2 VIEW COMPARISON:  November 07, 2018 FINDINGS: Lungs are clear. Heart size and pulmonary vascularity are normal. No adenopathy. No pneumothorax. No bone lesions. IMPRESSION: Lungs clear.  Cardiac silhouette within normal limits. Electronically Signed   By: Lowella Grip III M.D.   On: 04/27/2019 09:46   DG Knee Complete 4 Views Left  Result Date: 04/27/2019 CLINICAL DATA:  Patient reports she had a syncopal episode on Saturday and fell onto left knee. Patient reports pain and bruising to left knee since. Also reports difficulty bearing weight. EXAM: LEFT KNEE - COMPLETE 4+ VIEW COMPARISON:  None. FINDINGS: No fracture or bone lesion. Knee joint is normally spaced and aligned. No arthropathic changes. No joint effusion. Mild subcutaneous soft tissue edema is noted along the anterolateral knee. IMPRESSION: No fracture or joint abnormality Electronically Signed   By: Lajean Manes M.D.   On: 04/27/2019 12:28    Pending Labs Unresulted Labs (From admission, onward)    Start     Ordered   04/28/19 0500  CBC  Tomorrow morning,   STAT     04/27/19 1147   04/28/19 0500  Hemoglobin A1c  Tomorrow morning,   STAT     04/27/19 1324   04/28/19 0500  Lipid panel  Tomorrow morning,   STAT     04/27/19 1324   04/27/19 1900  Heparin level (unfractionated)  Once-Timed,   STAT     04/27/19 1512   04/27/19 1409  HIV Antibody (routine testing w rflx)  (HIV Antibody (Routine testing w reflex) panel)  Once,   STAT     04/27/19 1408          Vitals/Pain Today's Vitals   04/27/19 1700 04/27/19 1800 04/27/19 1900 04/27/19 2014  BP: (!) 164/82 (!) 162/96 (!) 191/96 (!) 174/99  Pulse: (!) 101 (!) 106 (!) 108 98  Resp: (!) 25 (!) 28 18 18   Temp:      TempSrc:      SpO2: 95% 98% 96% 98%  Weight:      Height:      PainSc:    9     Isolation Precautions No active isolations  Medications Medications  nitroGLYCERIN (NITROSTAT) SL tablet 0.4 mg (0.4 mg Sublingual Given 04/27/19 1211)  heparin  ADULT infusion 100 units/mL (25000 units/278m sodium chloride 0.45%) (1,150 Units/hr Intravenous New Bag/Given 04/27/19 1229)  0.9% sodium chloride infusion (has no administration  in time range)    Followed by  0.9% sodium chloride infusion (has no administration in time range)  sodium chloride flush (NS) 0.9 % injection 3 mL (3 mLs Intravenous Not Given 04/27/19 1237)  sodium chloride flush (NS) 0.9 % injection 3 mL (has no administration in time range)  0.9 %  sodium chloride infusion (has no administration in time range)  aspirin chewable tablet 81 mg (has no administration in time range)  ondansetron (ZOFRAN) injection 4 mg (4 mg Intravenous Given 04/27/19 1552)  hydrALAZINE (APRESOLINE) injection 5 mg (5 mg Intravenous Given 04/27/19 1639)  acetaminophen (TYLENOL) tablet 650 mg (650 mg Oral Given 04/27/19 1348)  LORazepam (ATIVAN) injection 1 mg (has no administration in time range)  insulin aspart (novoLOG) injection 0-9 Units (0 Units Subcutaneous Not Given 04/27/19 1638)  albuterol (PROVENTIL) (2.5 MG/3ML) 0.083% nebulizer solution 3 mL (has no administration in time range)  dextromethorphan-guaiFENesin (MUCINEX DM) 30-600 MG per 12 hr tablet 1 tablet (1 tablet Oral Given 04/27/19 1348)  morphine 2 MG/ML injection 2 mg (2 mg Intravenous Given 04/27/19 1552)  oxyCODONE-acetaminophen (PERCOCET/ROXICET) 5-325 MG per tablet 1 tablet (has no administration in time range)  carvedilol (COREG) tablet 3.125 mg (has no administration in time range)  EPINEPHrine (EPI-PEN) injection 0.3 mg (has no administration in time range)  ezetimibe (ZETIA) tablet 10 mg (has no administration in time range)  losartan (COZAAR) tablet 50 mg (has no administration in time range)  simvastatin (ZOCOR) tablet 40 mg (has no administration in time range)  spironolactone (ALDACTONE) tablet 25 mg (has no administration in time range)  DULoxetine (CYMBALTA) DR capsule 40 mg (has no administration in time range)  insulin glargine (2  Unit Dial) (TOUJEO MAX) Solostar Pen SOPN 100 Units (has no administration in time range)  pantoprazole (PROTONIX) EC tablet 40 mg (has no administration in time range)  polyethylene glycol (MIRALAX / GLYCOLAX) packet 17 g (has no administration in time range)  mirabegron ER (MYRBETRIQ) tablet 50 mg (has no administration in time range)  clopidogrel (PLAVIX) tablet 75 mg (has no administration in time range)  gabapentin (NEURONTIN) capsule 400-800 mg (has no administration in time range)  levETIRAcetam (KEPPRA) tablet 500 mg (has no administration in time range)  Calcium-Vitamin D3 600-200 MG-UNIT TABS 1 capsule (has no administration in time range)  budesonide (PULMICORT) nebulizer solution 0.5 mg (has no administration in time range)  fluticasone (FLONASE) 50 MCG/ACT nasal spray 2 spray (has no administration in time range)  mometasone-formoterol (DULERA) 100-5 MCG/ACT inhaler 2 puff (has no administration in time range)  cycloSPORINE (RESTASIS) 0.05 % ophthalmic emulsion 2 drop (has no administration in time range)  acetaminophen (TYLENOL) tablet 1,000 mg (1,000 mg Oral Given 04/27/19 1147)  heparin bolus via infusion 4,000 Units (4,000 Units Intravenous Bolus from Bag 04/27/19 1229)    Mobility walks with device Low fall risk   Focused Assessments Cardiac Assessment Handoff:    Lab Results  Component Value Date   CKTOTAL 130 11/14/2018   No results found for: DDIMER Does the Patient currently have chest pain? No     R Recommendations: See Admitting Provider Note  Report given to: Langley Gauss RN on 2A  Additional Notes:

## 2019-04-27 NOTE — Consult Note (Signed)
[facility] for Heparin Indication: chest pain/ACS  Allergies  Allergen Reactions  . Codeine Swelling and Other (See Comments)    Swelling and burning of mouth (inside)  . Lactose Intolerance (Gi) Nausea And Vomiting    Patient Measurements: Height: 5' 6"  (167.6 cm) Weight: 105.5 kg (232 lb 8 oz) IBW/kg (Calculated) : 59.3 Heparin Dosing Weight: 82.4 kg  Vital Signs: Temp: 98.2 F (36.8 C) (04/08 2116) Temp Source: Oral (04/08 2116) BP: 219/110 (04/08 2116) Pulse Rate: 99 (04/08 2116)  Labs: Recent Labs    04/27/19 0903 04/27/19 0903 04/27/19 1122 04/27/19 1213 04/27/19 1352 04/27/19 2022  HGB 12.2  --   --   --   --   --   HCT 39.0  --   --   --   --   --   PLT 242  --   --   --   --   --   APTT  --   --   --  27  --   --   LABPROT  --   --   --  13.0  --   --   INR  --   --   --  1.0  --   --   HEPARINUNFRC  --   --   --   --   --  0.25*  CREATININE 0.94  --   --   --   --   --   TROPONINIHS 10   < > 7  --  9 8   < > = values in this interval not displayed.    Estimated Creatinine Clearance: 84 mL/min (by C-G formula based on SCr of 0.94 mg/dL).   Medical History: Past Medical History:  Diagnosis Date  . Anxiety and depression 09/13/2006   Qualifier: Diagnosis of  By: Hassell Done [name], Tori Milks    . Arthritis    joint pain   . Asthma   . COPD (chronic obstructive pulmonary disease) (HCC)    chronic bronchitis   . Depression   . Diabetes mellitus    since age 46; type 2 IDDM  . Diabetic neuropathy, painful ([place])    FEET AND HANDS  . Diabetic retinopathy ([place])   . Diastolic dysfunction    a.  Echo 11/17: EF 60-65%, mild LVH, no RWMA, Gr1DD, mild AI, dilated aortic root measuring 38 mm, mildly dilated ascending aorta  . Dilated aortic root ([place])    11m by echo 11/2015  . Dizziness    secondary to diabetes and hypertension   . Epilepsy idiopathic petit mal ([place]    last seizure 2012;controlled w/ topomax  . GERD  (gastroesophageal reflux disease)   . Glaucoma    NOT ON ANY EYE DROPS   . Headache(784.0)    migraines   . Heart murmur    born with   . History of stress test    a. 11/17: Normal perfusion, EF 53%, normal study  . Hx of blood clots    hematomas removed from left side of brain from [id number]   . Hyperlipidemia   . Hypertension   . Legally blind    left eye   . MIGRAINE HEADACHE 09/14/2006   Qualifier: Diagnosis of  By: MarHassell DoneP, NykTori Milks . [facility]ocardial infarction ([facility] [place]  a.  Patient reported history of without objective documentation  . Nephrolithiasis    frequent urination , urination at nite  PT SEEN  IN ER 09/22/11 FOR BACK AND RT SIDED PAIN--HAS KNOWN STONE RT URETER AND UA IN ER SHOWED UTI  . Pain 09/23/11   LOWER BACK AND RIGHT SIDE--PT HAS RIGHT URETERAL STONE  . Pneumonia    hx of 2009  . Pyelonephritis   . Seizures ([place])   . Sleep apnea    [facility];does not use [facility] ; mild  . Stress incontinence   . Stroke [facility])    last 2003  RESIDUAL LEFT LEG WEAKNESS--NO OTHER RESIDUAL PROBLEMS    Medications:  Medications Prior to Admission  Medication Sig Dispense Refill Last Dose  . albuterol (ACCUNEB) 1.25 MG/3ML nebulizer solution Inhale into the lungs. Use 3 mls by nebulization every 6 hours as needed for wheezing   PRN at PRN  . albuterol (PROAIR HFA) 108 (90 Base) MCG/ACT inhaler INHALE 2 PUFFS BY MOUTH EVERY 6 HOURS AS NEEDED FOR WHEEZING OR SHORTNESS OF BREATH 3 Inhaler 3 PRN at PRN  . budesonide (PULMICORT) 0.5 MG/2ML nebulizer solution Inhale 0.5 mg into the lungs daily. Take 2 mls by nebulizations once daily    04/26/2019 at Unknown time  . Calcium Carbonate-Vitamin D (CALCIUM-VITAMIN D3 PO) D3 500- calcium 630 mg Take 1 capsule by mouth once daily   04/26/2019 at Unknown time  . carvedilol (COREG) 3.125 MG tablet Take 3.125 mg by mouth 2 (two) times daily.    04/26/2019 at Unknown time  . clopidogrel (PLAVIX) 75 MG tablet Take 1 tablet (75 mg  total) by mouth daily. 90 tablet 0 04/26/2019 at Unknown time  . cycloSPORINE (RESTASIS) 0.05 % ophthalmic emulsion Place 2 drops into both eyes 2 (two) times daily.   04/26/2019 at Unknown time  . Dulaglutide 1.5 MG/0.5ML SOPN Inject 1.5 mg into the skin once a week. 12 pen 3 04/26/2019 at Unknown time  . DULoxetine (CYMBALTA) 20 MG capsule Take 2 capsules (40 mg total) by mouth daily. 60 capsule 2 04/26/2019 at Unknown time  . DUPIXENT 300 MG/2ML SOPN Inject 300 mg into the skin every 14 (fourteen) days.    04/26/2019 at Unknown time  . EPINEPHrine 0.3 mg/0.3 mL IJ SOAJ injection Inject 0.3 mg into the muscle as needed for anaphylaxis.    PRN at PRN  . ezetimibe (ZETIA) 10 MG tablet Take 1 tablet (10 mg total) by mouth daily. 90 tablet 3 04/26/2019 at Unknown time  . fluticasone (FLONASE) 50 MCG/ACT nasal spray Place 2 sprays into both nostrils daily. 16 g 3 04/26/2019 at Unknown time  . Fluticasone-Salmeterol (WIXELA INHUB) 100-50 MCG/DOSE AEPB Inhale 1 puff into the lungs 2 (two) times daily.   04/26/2019 at Unknown time  . gabapentin (NEURONTIN) 400 MG capsule 400 mg qAM, 400 mg qPM, 800 mg qhs 120 capsule 2 04/26/2019 at Unknown time  . insulin aspart (NOVOLOG FLEXPEN) 100 UNIT/ML FlexPen Inject 18-50 Units into the skin 3 (three) times daily with meals.    04/26/2019 at Unknown time  . Insulin Glargine, 2 Unit Dial, 300 UNIT/ML SOPN Inject 120 Units into the skin daily. (Patient taking differently: Inject 136 Units into the skin daily. ) 15 mL 1 04/26/2019 at Unknown time  . levETIRAcetam (KEPPRA) 500 MG tablet Take 500 mg by mouth 2 (two) times daily. 1 TAB IN THE MORNING AND 1.5 TABS AT NIGHT   04/26/2019 at Unknown time  . losartan (COZAAR) 50 MG tablet Take 50 mg by mouth daily.   04/26/2019 at Unknown time  . mirabegron ER (MYRBETRIQ) 50 MG  TB24 tablet Take 1 tablet (50 mg total) by mouth daily. 90 tablet 1 04/26/2019 at Unknown time  . ondansetron (ZOFRAN ODT) 4 MG disintegrating tablet Take 1 tablet (4 mg total) by  mouth every 8 (eight) hours as needed for nausea or vomiting. 20 tablet 1 04/26/2019 at PRN  . oxyCODONE-acetaminophen (PERCOCET) 5-325 MG tablet Take 1 tablet by mouth every 12 (twelve) hours as needed for severe pain. 30 tablet 0 04/26/2019 at Unknown time  . pantoprazole (PROTONIX) 40 MG tablet Take 1 tablet (40 mg total) by mouth daily. 90 tablet 0 04/26/2019 at Unknown time  . polyethylene glycol (MIRALAX / GLYCOLAX) packet Take 17 g by mouth daily as needed for mild constipation. 100 each 3 04/26/2019 at Unknown time  . potassium chloride SA (KLOR-CON M20) 20 MEQ tablet Take 2 tablets (40 mEq total) by mouth daily. 60 tablet 5 04/26/2019 at Unknown time  . predniSONE (DELTASONE) 20 MG tablet Take 20 mg by mouth daily.    04/26/2019 at Unknown time  . simvastatin (ZOCOR) 40 MG tablet Take 40 mg by mouth daily.   04/26/2019 at Unknown time  . spironolactone (ALDACTONE) 25 MG tablet Take 1 tablet by mouth daily.   04/26/2019 at Unknown time  . terconazole (TERAZOL 7) 0.4 % vaginal cream Place 1 applicator vaginally at bedtime as needed (irritation).   04/26/2019 at Unknown time  . AMBULATORY NON FORMULARY MEDICATION Medication Name: Muscle and Joint Vanishing Scent Gel- Apply as needed to skin      Scheduled:  . [START ON 04/28/2019] aspirin  81 mg Oral Pre-Cath  . budesonide  0.5 mg Inhalation Daily  . calcium-vitamin D  1 tablet Oral Daily  . [START ON 04/28/2019] carvedilol  3.125 mg Oral BID PC  . clopidogrel  75 mg Oral Daily  . cycloSPORINE  2 drop Both Eyes BID  . dextromethorphan-guaiFENesin  1 tablet Oral BID  . DULoxetine  40 mg Oral Daily  . ezetimibe  10 mg Oral Daily  . fluticasone  2 spray Each Nare Daily  . [START ON 04/28/2019] gabapentin  400 mg Oral BID  . gabapentin  800 mg Oral QHS  . insulin aspart  0-9 Units Subcutaneous Q4H  . insulin glargine  100 Units Subcutaneous Daily  . [START ON 04/28/2019] levETIRAcetam  500 mg Oral q morning - 10a  . levETIRAcetam  750 mg Oral QHS  . losartan  50  mg Oral Daily  . mirabegron ER  50 mg Oral Daily  . mometasone-formoterol  2 puff Inhalation BID  . pantoprazole  40 mg Oral Daily  . [START ON 04/28/2019] pneumococcal 23 valent vaccine  0.5 mL Intramuscular Tomorrow-1000  . simvastatin  40 mg Oral Daily  . sodium chloride flush  3 mL Intravenous Q12H  . spironolactone  25 mg Oral Daily   Infusions:  . sodium chloride    . [START ON 04/28/2019] sodium chloride     Followed by  . [START ON 04/28/2019] sodium chloride    . heparin 1,150 Units/hr (04/27/19 1229)   PRN: sodium chloride, acetaminophen, albuterol, EPINEPHrine, hydrALAZINE, LORazepam, morphine injection, nitroGLYCERIN, ondansetron (ZOFRAN) IV, oxyCODONE-acetaminophen, polyethylene glycol, sodium chloride flush Anti-infectives (From admission, onward)   None      Assessment: Pharmacy consulted to start heparin for ACS. No DOAC noted.   Goal of Therapy:  Heparin level 0.3-0.7 units/ml Monitor platelets by anticoagulation protocol: Yes   Plan:  04/08 @ 2030 HL 0.25 subtherapeutic. Will increase rate to 1300 units/hr  and will recheck HL w/ am labs and continue to monitor.  Tobie Lords, PharmD, BCPS 04/27/2019,11:02 PM

## 2019-04-27 NOTE — ED Triage Notes (Signed)
Pt here for CP.  Saw cardiologist 2 weeks ago and had been having CP then; scheduled for procedure this month for a blockage per pt.  Pain is getting worse.  Pain is to left chest and radiates to left arm.  HX of MI.  Unlabored. Color WNL.  No diaphoresis at this time.

## 2019-04-27 NOTE — ED Notes (Signed)
Assisted pt back to bed. Pt requires minimal assistance uses cane. Pt. prefers to get out and use the bsc instead of purewick.

## 2019-04-28 ENCOUNTER — Encounter: Payer: Self-pay | Admitting: Cardiology

## 2019-04-28 ENCOUNTER — Encounter
Admission: EM | Disposition: A | Discharge status: Home / Self Care | Payer: Self-pay | Source: Home / Self Care | Attending: Family Medicine

## 2019-04-28 DIAGNOSIS — J449 Chronic obstructive pulmonary disease, unspecified: Secondary | ICD-10-CM

## 2019-04-28 DIAGNOSIS — I25119 Atherosclerotic heart disease of native coronary artery with unspecified angina pectoris: Secondary | ICD-10-CM

## 2019-04-28 DIAGNOSIS — Z8673 Personal history of transient ischemic attack (TIA), and cerebral infarction without residual deficits: Secondary | ICD-10-CM

## 2019-04-28 HISTORY — PX: RIGHT/LEFT HEART CATH AND CORONARY ANGIOGRAPHY: CATH118266

## 2019-04-28 LAB — HEPARIN LEVEL (UNFRACTIONATED)
Heparin Unfractionated: 0.2 IU/mL — ABNORMAL LOW (ref 0.30–0.70)
Heparin Unfractionated: <0.1 IU/mL — ABNORMAL LOW (ref 0.30–0.70)

## 2019-04-28 LAB — LIPID PANEL
Cholesterol: 155 mg/dL (ref 0–200)
HDL: 43 mg/dL (ref >40)
LDL Cholesterol: 92 mg/dL (ref 0–99)
Total CHOL/HDL Ratio: 3.6 RATIO
Triglycerides: 102 mg/dL (ref <150)
VLDL: 20 mg/dL (ref 0–40)

## 2019-04-28 LAB — CBC
HCT: 40.2 % (ref 36.0–46.0)
Hemoglobin: 12.9 g/dL (ref 12.0–15.0)
MCH: 29.8 pg (ref 26.0–34.0)
MCHC: 32.1 g/dL (ref 30.0–36.0)
MCV: 92.8 fL (ref 80.0–100.0)
Platelets: 271 10*3/uL (ref 150–400)
RBC: 4.33 MIL/uL (ref 3.87–5.11)
RDW: 14.2 % (ref 11.5–15.5)
WBC: 7.7 10*3/uL (ref 4.0–10.5)
nRBC: 0 % (ref 0.0–0.2)

## 2019-04-28 LAB — GLUCOSE, CAPILLARY
Glucose-Capillary: 192 mg/dL — ABNORMAL HIGH (ref 70–99)
Glucose-Capillary: 169 mg/dL — ABNORMAL HIGH (ref 70–99)
Glucose-Capillary: 151 mg/dL — ABNORMAL HIGH (ref 70–99)
Glucose-Capillary: 169 mg/dL — ABNORMAL HIGH (ref 70–99)
Glucose-Capillary: 143 mg/dL — ABNORMAL HIGH (ref 70–99)

## 2019-04-28 SURGERY — RIGHT/LEFT HEART CATH AND CORONARY ANGIOGRAPHY
Anesthesia: Moderate Sedation

## 2019-04-28 MED ORDER — ONDANSETRON HCL 4 MG/2ML IJ SOLN: 4.0000 mg | Freq: Four times a day (QID) | INTRAMUSCULAR | Status: DC | PRN

## 2019-04-28 MED ORDER — ENOXAPARIN SODIUM 60 MG/0.6ML ~~LOC~~ SOLN
50.0000 mg | SUBCUTANEOUS | Status: DC
  Administered 2019-04-28: 50 mg via SUBCUTANEOUS
  Filled 2019-04-28 (×2): qty 0.6

## 2019-04-28 MED ORDER — MIDAZOLAM HCL 2 MG/2ML IJ SOLN
INTRAMUSCULAR | Status: AC
  Filled 2019-04-28: qty 2

## 2019-04-28 MED ORDER — SODIUM CHLORIDE 0.9 % IV SOLN: 250.0000 mL | INTRAVENOUS | Status: DC | PRN

## 2019-04-28 MED ORDER — HEPARIN (PORCINE) IN NACL 1000-0.9 UT/500ML-% IV SOLN
INTRAVENOUS | Status: AC
  Filled 2019-04-28: qty 1000

## 2019-04-28 MED ORDER — HEPARIN (PORCINE) IN NACL 1000-0.9 UT/500ML-% IV SOLN
INTRAVENOUS | Status: DC | PRN
  Administered 2019-04-28: 500 mL

## 2019-04-28 MED ORDER — MIDAZOLAM HCL 2 MG/2ML IJ SOLN
INTRAMUSCULAR | Status: DC | PRN
  Administered 2019-04-28: 1 mg via INTRAVENOUS

## 2019-04-28 MED ORDER — SODIUM CHLORIDE 0.9% FLUSH
3.0000 mL | Freq: Two times a day (BID) | INTRAVENOUS | Status: DC
  Administered 2019-04-29: 10:00:00 3 mL via INTRAVENOUS

## 2019-04-28 MED ORDER — SODIUM CHLORIDE 0.9 % WEIGHT BASED INFUSION: 1.0000 mL/kg/h | INTRAVENOUS | Status: AC

## 2019-04-28 MED ORDER — FENTANYL CITRATE (PF) 100 MCG/2ML IJ SOLN
INTRAMUSCULAR | Status: DC | PRN
  Administered 2019-04-28: 50 ug via INTRAVENOUS

## 2019-04-28 MED ORDER — MORPHINE SULFATE (PF) 2 MG/ML IV SOLN
INTRAVENOUS | Status: AC
  Filled 2019-04-28: qty 1

## 2019-04-28 MED ORDER — IOHEXOL 300 MG/ML  SOLN
INTRAMUSCULAR | Status: DC | PRN
  Administered 2019-04-28: 100 mL

## 2019-04-28 MED ORDER — VERAPAMIL HCL 2.5 MG/ML IV SOLN
INTRAVENOUS | Status: AC
  Filled 2019-04-28: qty 2

## 2019-04-28 MED ORDER — HEPARIN SODIUM (PORCINE) 1000 UNIT/ML IJ SOLN
INTRAMUSCULAR | Status: AC
  Filled 2019-04-28: qty 1

## 2019-04-28 MED ORDER — SODIUM CHLORIDE 0.9% FLUSH: 3.0000 mL | INTRAVENOUS | Status: DC | PRN

## 2019-04-28 MED ORDER — LABETALOL HCL 5 MG/ML IV SOLN: 10.0000 mg | INTRAVENOUS | Status: AC | PRN

## 2019-04-28 MED ORDER — CARVEDILOL 6.25 MG PO TABS
6.2500 mg | ORAL_TABLET | Freq: Two times a day (BID) | ORAL | Status: DC
  Administered 2019-04-28 – 2019-04-29 (×2): 6.25 mg via ORAL
  Filled 2019-04-28 (×2): qty 1

## 2019-04-28 MED ORDER — HEPARIN BOLUS VIA INFUSION
1200.0000 [IU] | Freq: Once | INTRAVENOUS | Status: AC
  Administered 2019-04-28: 1200 [IU] via INTRAVENOUS
  Filled 2019-04-28: qty 1200

## 2019-04-28 MED ORDER — ACETAMINOPHEN 325 MG PO TABS: 650.0000 mg | ORAL_TABLET | ORAL | Status: DC | PRN

## 2019-04-28 MED ORDER — HYDRALAZINE HCL 20 MG/ML IJ SOLN: 10.0000 mg | INTRAMUSCULAR | Status: AC | PRN

## 2019-04-28 MED ORDER — FENTANYL CITRATE (PF) 100 MCG/2ML IJ SOLN
INTRAMUSCULAR | Status: AC
  Filled 2019-04-28: qty 2

## 2019-04-28 SURGICAL SUPPLY — 14 items
CATH INFINITI 5FR JL4 (CATHETERS) ×2 IMPLANT
CATH INFINITI JR4 5F (CATHETERS) ×2 IMPLANT
CATH SWANZ 7F THERMO (CATHETERS) ×2 IMPLANT
DEVICE CLOSURE MYNXGRIP 5F (Vascular Products) ×2 IMPLANT
GLIDESHEATH SLEND SS 6F .021 (SHEATH) IMPLANT
GUIDEWIRE INQWIRE 1.5J.035X260 (WIRE) IMPLANT
INQWIRE 1.5J .035X260CM (WIRE)
KIT MANI 3VAL PERCEP (MISCELLANEOUS) ×2 IMPLANT
NEEDLE PERC 18GX7CM (NEEDLE) ×2 IMPLANT
PACK CARDIAC CATH (CUSTOM PROCEDURE TRAY) ×2 IMPLANT
SHEATH AVANTI 5FR X 11CM (SHEATH) ×2 IMPLANT
SHEATH AVANTI 7FRX11 (SHEATH) ×2 IMPLANT
SHEATH GLIDE SLENDER 4/5FR (SHEATH) IMPLANT
WIRE GUIDERIGHT .035X150 (WIRE) ×2 IMPLANT

## 2019-04-28 NOTE — Progress Notes (Signed)
PROGRESS NOTE  [name]  LMB:[id number] DOB: 06/01/1965 DOA: 04/27/2019 PCP: Hubbard Hartshorn, [name]   Brief Narrative: [name] is a 54 y.o. female with medical history significant of hypertension, hyperlipidemia, diabetes mellitus, COPD, asthma, stroke, GERD, depression with anxiety, OSA, seizure, kidney stone, CAD, myocardial infarction, and left eye blindness who presented to [facility] ED 4/8 with left arm and chest pain. Troponin not elevated. The patient has had a recent stress test showing a minimal reversible distal anterior myocardial perfusion defect consistent with possible myocardial ischemia. Echocardiogram shows mild global LV systolic dysfunction and some worsening septal dysfunction with moderate tricuspid regurgitation with pulmonary hypertension. Cardiology was consulted, heparin IV started. Left and right heart catheterization was performed 4/9 showing no significant cardiac disease, specifically normal right-sided pressures, preserved LVEF, and no CAD. She remains hypertensive and is drowsy following cardiac catheterization.  Assessment & Plan: Principal Problem:   Chest pain Active Problems:   Hyperlipemia   Essential hypertension   GERD   Seizure disorder (HCC)   History of CVA (cerebrovascular accident)   Type 2 diabetes mellitus without complication, with long-term current use of insulin (HCC)   COPD (chronic obstructive pulmonary disease) (HCC)   CAD (coronary artery disease)  Left chest, arm pain, history of CAD, HTN: Without significant coronary artery disease on LHC. Has been relatively chronic. This is tender to palpation in some areas, so could consider fibromyalgia, MSK etiology, and/or opioid-induced hyperalgesia in setting of chornic opioid use. - Consider deescalation of opioids - No pulmonary symptoms or distress on exam - Could trial NSAID if this continues, currently improved.  - Continue beta blocker, increase dose with elevated BP.  -  Continue plavix, statin, zetia - Change back to lovenox for DVT ppx now that no obstructive CAD noted  HTN: With severely elevated BP.  - Continue losartan, spironolactone, and increase coreg.   COPD: No wheezing - Continue bronchodilators - If wheezing develops, consider switching to cardioselective beta blocker  T2DM: Uncontrolled with hyperglycemia, HbA1c 12.6%.  - continue basal bolus insulin, needs close follow up to improve this risk factor.   History of CVA:  - Plavix, lipid reduction, blood sugar improvement.   GERD:  - Continue PPI  Seizure disorder:  - Precautions - Continue keppra  HLD:  - Continue statin  DVT prophylaxis: Lovenox Code Status: Full Family Communication: Daughter at bedside Disposition Plan: Patient from home, will discharge there in 24 hours. Currently drowsy after catheterization and augmenting antihypertensives, will monitor creatinine in AM.  Consultants:   Cardiology  Procedures: [facility] 1/0/0712 LV end diastolic pressure is normal. Normal left ventricular function of 60% Normal right sided pressures no evidence of pulmonary hypertension Normal coronaries   Conclusion Normal right-sided heart cath Wedge mean of 10 PA mean of 22 Normal left ventricular function of 60% Normal coronaries  Antimicrobials:  None   Subjective: Chest pain is on the left, with radiation down left arm, waxing/waning, sometimes gone completely, without reliable provocation (e.g. exertion, deep breathing, coughing, arm movements, neck movements).   Objective: Vitals:   04/28/19 1000 04/28/19 1015 04/28/19 1025 04/28/19 1040  BP: (!) 169/93 (!) 163/94 (!) 172/97 (!) 159/108  Pulse: 92 94 97 94  Resp: 16 20 (!) 22   Temp:      TempSrc:      SpO2: 96% 98% 94% 99%  Weight:      Height:        Intake/Output Summary (Last 24 hours) at 04/28/2019 1309 Last  data filed at 04/28/2019 1135 Gross per 24 hour  Intake 224.36 ml  Output 2000 ml  Net -1775.64 ml     Filed Weights   04/27/19 2116 04/28/19 0318 04/28/19 0745  Weight: 105.5 kg 104.5 kg 101.2 kg    Gen: Obese, drowsy female in no distress Pulm: Non-labored breathing room air. Clear to auscultation bilaterally.  CV: Regular rate and rhythm. No murmur, rub, or gallop. No JVD, no pitting pedal edema. GI: Abdomen soft, non-tender, non-distended, with normoactive bowel sounds. No organomegaly or masses felt. Ext: Warm, no deformities Skin: No rashes, lesions or ulcers. Right femoral cath site without active bleeding/bruising. Neuro: Alert and oriented. No focal neurological deficits. Psych: Judgement and insight appear normal. Mood & affect appropriate.   Data Reviewed: I have personally reviewed following labs and imaging studies  CBC: Recent Labs  Lab 04/27/19 0903 04/28/19 0419  WBC 5.1 7.7  HGB 12.2 12.9  HCT 39.0 40.2  MCV 95.4 92.8  PLT 242 762   Basic Metabolic Panel: Recent Labs  Lab 04/27/19 0903  NA 140  K 4.0  CL 104  CO2 28  GLUCOSE 197*  BUN 13  CREATININE 0.94  CALCIUM 9.1   GFR: Estimated Creatinine Clearance: 82.2 mL/min (by C-G formula based on SCr of 0.94 mg/dL). Liver Function Tests: No results for input(s): AST, ALT, ALKPHOS, BILITOT, PROT, ALBUMIN in the last 168 hours. No results for input(s): LIPASE, AMYLASE in the last 168 hours. No results for input(s): AMMONIA in the last 168 hours. Coagulation Profile: Recent Labs  Lab 04/27/19 1213  INR 1.0   Cardiac Enzymes: No results for input(s): CKTOTAL, CKMB, CKMBINDEX, TROPONINI in the last 168 hours. BNP (last 3 results) No results for input(s): PROBNP in the last 8760 hours. HbA1C: No results for input(s): HGBA1C in the last 72 hours. CBG: Recent Labs  Lab 04/27/19 1348 04/27/19 2348 04/28/19 0432 04/28/19 0744 04/28/19 1210  GLUCAP 129* 173* 192* 169* 151*   Lipid Profile: Recent Labs    04/28/19 0419  CHOL 155  HDL 43  LDLCALC 92  TRIG 102  CHOLHDL 3.6   Thyroid  Function Tests: No results for input(s): TSH, T4TOTAL, FREET4, T3FREE, THYROIDAB in the last 72 hours. Anemia Panel: No results for input(s): VITAMINB12, FOLATE, FERRITIN, TIBC, IRON, RETICCTPCT in the last 72 hours. Urine analysis:    Component Value Date/Time   COLORURINE YELLOW (A) 12/05/2018 1310   APPEARANCEUR HAZY (A) 12/05/2018 1310   LABSPEC 1.021 12/05/2018 1310   PHURINE 5.0 12/05/2018 1310   GLUCOSEU 50 (A) 12/05/2018 1310   HGBUR NEGATIVE 12/05/2018 1310   HGBUR negative 09/30/2006 1400   BILIRUBINUR NEGATIVE 12/05/2018 1310   BILIRUBINUR neg 11/11/2016 1214   KETONESUR NEGATIVE 12/05/2018 1310   PROTEINUR 30 (A) 12/05/2018 1310   UROBILINOGEN 0.2 11/11/2016 1214   UROBILINOGEN 0.2 06/18/2014 0028   NITRITE NEGATIVE 12/05/2018 1310   LEUKOCYTESUR LARGE (A) 12/05/2018 1310   Recent Results (from the past 240 hour(s))  Respiratory Panel by RT PCR (Flu A&B, Covid) - Nasopharyngeal Swab     Status: None   Collection Time: 04/27/19 11:50 AM   Specimen: Nasopharyngeal Swab  Result Value Ref Range Status   SARS Coronavirus 2 by RT PCR NEGATIVE NEGATIVE Final    Comment: (NOTE) SARS-CoV-2 target nucleic acids are NOT DETECTED. The SARS-CoV-2 RNA is generally detectable in upper respiratoy specimens during the acute phase of infection. The lowest concentration of SARS-CoV-2 viral copies this assay can detect is 131  copies/mL. A negative result does not preclude SARS-Cov-2 infection and should not be used as the sole basis for treatment or other patient management decisions. A negative result may occur with  improper specimen collection/handling, submission of specimen other than nasopharyngeal swab, presence of viral mutation(s) within the areas targeted by this assay, and inadequate number of viral copies (<131 copies/mL). A negative result must be combined with clinical observations, patient history, and epidemiological information. The expected result is  Negative. Fact Sheet for Patients:  PinkCheek.be Fact Sheet for Healthcare Providers:  GravelBags.it This test is not yet ap proved or cleared by the [place] FDA and  has been authorized for detection and/or diagnosis of SARS-CoV-2 by FDA under an Emergency Use Authorization (EUA). This EUA will remain  in effect (meaning this test can be used) for the duration of the COVID-19 declaration under Section 564(b)(1) of the Act, 21 U.S.C. section 360bbb-3(b)(1), unless the authorization is terminated or revoked sooner.    Influenza A by PCR NEGATIVE NEGATIVE Final   Influenza B by PCR NEGATIVE NEGATIVE Final    Comment: (NOTE) The Xpert Xpress SARS-CoV-2/FLU/RSV assay is intended as an aid in  the diagnosis of influenza from Nasopharyngeal swab specimens and  should not be used as a sole basis for treatment. Nasal washings and  aspirates are unacceptable for Xpert Xpress SARS-CoV-2/FLU/RSV  testing. Fact Sheet for Patients: PinkCheek.be Fact Sheet for Healthcare Providers: GravelBags.it This test is not yet approved or cleared by the [place] FDA and  has been authorized for detection and/or diagnosis of SARS-CoV-2 by  FDA under an Emergency Use Authorization (EUA). This EUA will remain  in effect (meaning this test can be used) for the duration of the  Covid-19 declaration under Section 564(b)(1) of the Act, 21  U.S.C. section 360bbb-3(b)(1), unless the authorization is  terminated or revoked. Performed at [facility], [address], [place], [place] [address]       Radiology Studies: DG Chest 2 View  Result Date: 04/27/2019 CLINICAL DATA:  Chest pain EXAM: CHEST - 2 VIEW COMPARISON:  November 07, 2018 FINDINGS: Lungs are clear. Heart size and pulmonary vascularity are normal. No adenopathy. No pneumothorax. No bone lesions. IMPRESSION: Lungs  clear.  Cardiac silhouette within normal limits. Electronically Signed   By: Lowella Grip III M.D.   On: 04/27/2019 09:46   CARDIAC CATHETERIZATION  Result Date: 08/26/4164  LV end diastolic pressure is normal.  Normal left ventricular function of 60%  Normal right sided pressures no evidence of pulmonary hypertension  Normal coronaries  Conclusion Normal right-sided heart cath Wedge mean of 10 PA mean of 22 Normal left ventricular function of 60% Normal coronaries   DG Knee Complete 4 Views Left  Result Date: 04/27/2019 CLINICAL DATA:  Patient reports she had a syncopal episode on Saturday and fell onto left knee. Patient reports pain and bruising to left knee since. Also reports difficulty bearing weight. EXAM: LEFT KNEE - COMPLETE 4+ VIEW COMPARISON:  None. FINDINGS: No fracture or bone lesion. Knee joint is normally spaced and aligned. No arthropathic changes. No joint effusion. Mild subcutaneous soft tissue edema is noted along the anterolateral knee. IMPRESSION: No fracture or joint abnormality Electronically Signed   By: Lajean Manes M.D.   On: 04/27/2019 12:28    Scheduled Meds: . budesonide  0.5 mg Inhalation Daily  . calcium-vitamin D  1 tablet Oral Daily  . carvedilol  3.125 mg Oral BID PC  . clopidogrel  75 mg  Oral Daily  . cycloSPORINE  2 drop Both Eyes BID  . dextromethorphan-guaiFENesin  1 tablet Oral BID  . DULoxetine  40 mg Oral Daily  . ezetimibe  10 mg Oral Daily  . fluticasone  2 spray Each Nare Daily  . gabapentin  400 mg Oral BID  . gabapentin  800 mg Oral QHS  . insulin aspart  0-9 Units Subcutaneous Q4H  . insulin glargine  100 Units Subcutaneous Daily  . levETIRAcetam  500 mg Oral q morning - 10a  . levETIRAcetam  750 mg Oral QHS  . losartan  50 mg Oral Daily  . mirabegron ER  50 mg Oral Daily  . mometasone-formoterol  2 puff Inhalation BID  . morphine      . pantoprazole  40 mg Oral Daily  . pneumococcal 23 valent vaccine  0.5 mL Intramuscular  Tomorrow-1000  . simvastatin  40 mg Oral Daily  . sodium chloride flush  3 mL Intravenous Q12H  . sodium chloride flush  3 mL Intravenous Q12H  . spironolactone  25 mg Oral Daily   Continuous Infusions: . sodium chloride    . sodium chloride    . heparin Stopped (04/28/19 0754)     LOS: 1 day   Time spent: 25 minutes.  Patrecia Pour, MD [facility] [email or web address] 04/28/2019, 1:09 PM

## 2019-04-28 NOTE — Consult Note (Signed)
[facility] for Heparin Indication: chest pain/ACS  Allergies  Allergen Reactions  . Codeine Swelling and Other (See Comments)    Swelling and burning of mouth (inside)  . Lactose Intolerance (Gi) Nausea And Vomiting    Patient Measurements: Height: 5' 6"  (167.6 cm) Weight: 104.5 kg (230 lb 6.1 oz) IBW/kg (Calculated) : 59.3 Heparin Dosing Weight: 82.4 kg  Vital Signs: Temp: 98.8 F (37.1 C) (04/09 0318) Temp Source: Oral (04/09 0318) BP: 159/105 (04/09 0318) Pulse Rate: 113 (04/09 0318)  Labs: Recent Labs    04/27/19 0903 04/27/19 0903 04/27/19 1122 04/27/19 1213 04/27/19 1352 04/27/19 2022 04/28/19 0419  HGB 12.2  --   --   --   --   --  12.9  HCT 39.0  --   --   --   --   --  40.2  PLT 242  --   --   --   --   --  271  APTT  --   --   --  27  --   --   --   LABPROT  --   --   --  13.0  --   --   --   INR  --   --   --  1.0  --   --   --   HEPARINUNFRC  --   --   --   --   --  0.25* 0.20*  CREATININE 0.94  --   --   --   --   --   --   TROPONINIHS 10   < > 7  --  9 8  --    < > = values in this interval not displayed.    Estimated Creatinine Clearance: 83.6 mL/min (by C-G formula based on SCr of 0.94 mg/dL).   Medical History: Past Medical History:  Diagnosis Date  . Anxiety and depression 09/13/2006   Qualifier: Diagnosis of  By: Hassell Done [name], Tori Milks    . Arthritis    joint pain   . Asthma   . COPD (chronic obstructive pulmonary disease) (HCC)    chronic bronchitis   . Depression   . Diabetes mellitus    since age 89; type 2 IDDM  . Diabetic neuropathy, painful ([place])    FEET AND HANDS  . Diabetic retinopathy ([place])   . Diastolic dysfunction    a.  Echo 11/17: EF 60-65%, mild LVH, no RWMA, Gr1DD, mild AI, dilated aortic root measuring 38 mm, mildly dilated ascending aorta  . Dilated aortic root ([place])    37m by echo 11/2015  . Dizziness    secondary to diabetes and hypertension   . Epilepsy idiopathic petit  mal ([place]    last seizure 2012;controlled w/ topomax  . GERD (gastroesophageal reflux disease)   . Glaucoma    NOT ON ANY EYE DROPS   . Headache(784.0)    migraines   . Heart murmur    born with   . History of stress test    a. 11/17: Normal perfusion, EF 53%, normal study  . Hx of blood clots    hematomas removed from left side of brain from [id number]   . Hyperlipidemia   . Hypertension   . Legally blind    left eye   . MIGRAINE HEADACHE 09/14/2006   Qualifier: Diagnosis of  By: MarHassell DoneP, NykTori Milks . Myocardial infarction ([place]  a.  Patient  reported history of without objective documentation  . Nephrolithiasis    frequent urination , urination at nite  PT SEEN IN ER 09/22/11 FOR BACK AND RT SIDED PAIN--HAS KNOWN STONE RT URETER AND UA IN ER SHOWED UTI  . Pain 09/23/11   LOWER BACK AND RIGHT SIDE--PT HAS RIGHT URETERAL STONE  . Pneumonia    hx of 2009  . Pyelonephritis   . Seizures ([place])   . Sleep apnea    [facility] not [facility] ; mild  . Stress incontinence   . Stroke [facility])    last 2003  RESIDUAL LEFT LEG WEAKNESS--NO OTHER RESIDUAL PROBLEMS    Medications:  Medications Prior to Admission  Medication Sig Dispense Refill Last Dose  . albuterol (ACCUNEB) 1.25 MG/3ML nebulizer solution Inhale into the lungs. Use 3 mls by nebulization every 6 hours as needed for wheezing   PRN at PRN  . albuterol (PROAIR HFA) 108 (90 Base) MCG/ACT inhaler INHALE 2 PUFFS BY MOUTH EVERY 6 HOURS AS NEEDED FOR WHEEZING OR SHORTNESS OF BREATH 3 Inhaler 3 PRN at PRN  . budesonide (PULMICORT) 0.5 MG/2ML nebulizer solution Inhale 0.5 mg into the lungs daily. Take 2 mls by nebulizations once daily    04/26/2019 at Unknown time  . Calcium Carbonate-Vitamin D (CALCIUM-VITAMIN D3 PO) D3 500- calcium 630 mg Take 1 capsule by mouth once daily   04/26/2019 at Unknown time  . carvedilol (COREG) 3.125 MG tablet Take 3.125 mg by mouth 2 (two) times daily.    04/26/2019 at Unknown  time  . clopidogrel (PLAVIX) 75 MG tablet Take 1 tablet (75 mg total) by mouth daily. 90 tablet 0 04/26/2019 at Unknown time  . cycloSPORINE (RESTASIS) 0.05 % ophthalmic emulsion Place 2 drops into both eyes 2 (two) times daily.   04/26/2019 at Unknown time  . Dulaglutide 1.5 MG/0.5ML SOPN Inject 1.5 mg into the skin once a week. 12 pen 3 04/26/2019 at Unknown time  . DULoxetine (CYMBALTA) 20 MG capsule Take 2 capsules (40 mg total) by mouth daily. 60 capsule 2 04/26/2019 at Unknown time  . DUPIXENT 300 MG/2ML SOPN Inject 300 mg into the skin every 14 (fourteen) days.    04/26/2019 at Unknown time  . EPINEPHrine 0.3 mg/0.3 mL IJ SOAJ injection Inject 0.3 mg into the muscle as needed for anaphylaxis.    PRN at PRN  . ezetimibe (ZETIA) 10 MG tablet Take 1 tablet (10 mg total) by mouth daily. 90 tablet 3 04/26/2019 at Unknown time  . fluticasone (FLONASE) 50 MCG/ACT nasal spray Place 2 sprays into both nostrils daily. 16 g 3 04/26/2019 at Unknown time  . Fluticasone-Salmeterol (WIXELA INHUB) 100-50 MCG/DOSE AEPB Inhale 1 puff into the lungs 2 (two) times daily.   04/26/2019 at Unknown time  . gabapentin (NEURONTIN) 400 MG capsule 400 mg qAM, 400 mg qPM, 800 mg qhs 120 capsule 2 04/26/2019 at Unknown time  . insulin aspart (NOVOLOG FLEXPEN) 100 UNIT/ML FlexPen Inject 18-50 Units into the skin 3 (three) times daily with meals.    04/26/2019 at Unknown time  . Insulin Glargine, 2 Unit Dial, 300 UNIT/ML SOPN Inject 120 Units into the skin daily. (Patient taking differently: Inject 136 Units into the skin daily. ) 15 mL 1 04/26/2019 at Unknown time  . levETIRAcetam (KEPPRA) 500 MG tablet Take 500 mg by mouth 2 (two) times daily. 1 TAB IN THE MORNING AND 1.5 TABS AT NIGHT   04/26/2019 at Unknown time  . losartan (COZAAR) 50  MG tablet Take 50 mg by mouth daily.   04/26/2019 at Unknown time  . mirabegron ER (MYRBETRIQ) 50 MG TB24 tablet Take 1 tablet (50 mg total) by mouth daily. 90 tablet 1 04/26/2019 at Unknown time  . ondansetron  (ZOFRAN ODT) 4 MG disintegrating tablet Take 1 tablet (4 mg total) by mouth every 8 (eight) hours as needed for nausea or vomiting. 20 tablet 1 04/26/2019 at PRN  . oxyCODONE-acetaminophen (PERCOCET) 5-325 MG tablet Take 1 tablet by mouth every 12 (twelve) hours as needed for severe pain. 30 tablet 0 04/26/2019 at Unknown time  . pantoprazole (PROTONIX) 40 MG tablet Take 1 tablet (40 mg total) by mouth daily. 90 tablet 0 04/26/2019 at Unknown time  . polyethylene glycol (MIRALAX / GLYCOLAX) packet Take 17 g by mouth daily as needed for mild constipation. 100 each 3 04/26/2019 at Unknown time  . potassium chloride SA (KLOR-CON M20) 20 MEQ tablet Take 2 tablets (40 mEq total) by mouth daily. 60 tablet 5 04/26/2019 at Unknown time  . predniSONE (DELTASONE) 20 MG tablet Take 20 mg by mouth daily.    04/26/2019 at Unknown time  . simvastatin (ZOCOR) 40 MG tablet Take 40 mg by mouth daily.   04/26/2019 at Unknown time  . spironolactone (ALDACTONE) 25 MG tablet Take 1 tablet by mouth daily.   04/26/2019 at Unknown time  . terconazole (TERAZOL 7) 0.4 % vaginal cream Place 1 applicator vaginally at bedtime as needed (irritation).   04/26/2019 at Unknown time  . AMBULATORY NON FORMULARY MEDICATION Medication Name: Muscle and Joint Vanishing Scent Gel- Apply as needed to skin      Scheduled:  . budesonide  0.5 mg Inhalation Daily  . calcium-vitamin D  1 tablet Oral Daily  . carvedilol  3.125 mg Oral BID PC  . clopidogrel  75 mg Oral Daily  . cycloSPORINE  2 drop Both Eyes BID  . dextromethorphan-guaiFENesin  1 tablet Oral BID  . DULoxetine  40 mg Oral Daily  . ezetimibe  10 mg Oral Daily  . fluticasone  2 spray Each Nare Daily  . gabapentin  400 mg Oral BID  . gabapentin  800 mg Oral QHS  . heparin  1,200 Units Intravenous Once  . insulin aspart  0-9 Units Subcutaneous Q4H  . insulin glargine  100 Units Subcutaneous Daily  . levETIRAcetam  500 mg Oral q morning - 10a  . levETIRAcetam  750 mg Oral QHS  . losartan  50  mg Oral Daily  . mirabegron ER  50 mg Oral Daily  . mometasone-formoterol  2 puff Inhalation BID  . pantoprazole  40 mg Oral Daily  . pneumococcal 23 valent vaccine  0.5 mL Intramuscular Tomorrow-1000  . simvastatin  40 mg Oral Daily  . sodium chloride flush  3 mL Intravenous Q12H  . spironolactone  25 mg Oral Daily   Infusions:  . sodium chloride    . sodium chloride 3 mL/kg/hr (04/28/19 2094)   Followed by  . sodium chloride    . heparin 1,300 Units/hr (04/28/19 0445)   PRN: sodium chloride, acetaminophen, albuterol, EPINEPHrine, hydrALAZINE, LORazepam, morphine injection, nitroGLYCERIN, ondansetron (ZOFRAN) IV, oxyCODONE-acetaminophen, polyethylene glycol, sodium chloride flush Anti-infectives (From admission, onward)   None      Assessment: Pharmacy consulted to start heparin for ACS. No DOAC noted.   Goal of Therapy:  Heparin level 0.3-0.7 units/ml Monitor platelets by anticoagulation protocol: Yes   Plan:  04/09 @ 0400 HL 0.20 subtherapeutic. Will rebolus w/ heparin  1200 units IV x 1 and increase rate to 1450 units/hr and will recheck HL at 1300 and continue to monitor.  Tobie Lords, PharmD, BCPS 04/28/2019,6:57 AM

## 2019-04-28 NOTE — Progress Notes (Signed)
[facility] Cardiology [facility] Encounter Note  Patient: [name] / Admit Date: 04/27/2019 / Date of Encounter: 04/28/2019, 8:57 AM   Subjective: Patient with severe left upper chest discomfort and arm discomfort last night requiring morphine and other medication management.  EKG is unchanged at this time and troponin level at a peak of 10.  No current evidence of chest discomfort at this time.  Patient is to have further evaluation with right and left heart catheterization today  Review of Systems: Positive for: Chest pain Negative for: Vision change, hearing change, syncope, dizziness, nausea, vomiting,diarrhea, bloody stool, stomach pain, cough, congestion, diaphoresis, urinary frequency, urinary pain,skin lesions, skin rashes Others previously listed  Objective: Telemetry: Normal sinus rhythm Physical Exam: Blood pressure (!) 185/102, pulse 100, temperature 98.3 F (36.8 C), temperature source Oral, resp. rate 17, height 5' 6"  (1.676 m), weight 101.2 kg, SpO2 96 %. Body mass index is 35.99 kg/m. General: Well developed, well nourished, in no acute distress. Head: Normocephalic, atraumatic, sclera non-icteric, no xanthomas, nares are without discharge. Neck: No apparent masses Lungs: Normal respirations with no wheezes, no rhonchi, no rales , no crackles   Heart: Regular rate and rhythm, normal S1 S2, no murmur, no rub, no gallop, PMI is normal size and placement, carotid upstroke normal without bruit, jugular venous pressure normal Abdomen: Soft, non-tender, non-distended with normoactive bowel sounds. No hepatosplenomegaly. Abdominal aorta is normal size without bruit Extremities: No edema, no clubbing, no cyanosis, no ulcers,  Peripheral: 2+ radial, 2+ femoral, 2+ dorsal pedal pulses Neuro: Alert and oriented. Moves all extremities spontaneously. Psych:  Responds to questions appropriately with a normal affect.   Intake/Output Summary (Last 24 hours) at 04/28/2019  0857 Last data filed at 04/28/2019 0536 Gross per 24 hour  Intake 224.36 ml  Output 1100 ml  Net -875.64 ml    Inpatient Medications:  . [MAR Hold] budesonide  0.5 mg Inhalation Daily  . [MAR Hold] calcium-vitamin D  1 tablet Oral Daily  . [MAR Hold] carvedilol  3.125 mg Oral BID PC  . [MAR Hold] clopidogrel  75 mg Oral Daily  . [MAR Hold] cycloSPORINE  2 drop Both Eyes BID  . [MAR Hold] dextromethorphan-guaiFENesin  1 tablet Oral BID  . [MAR Hold] DULoxetine  40 mg Oral Daily  . [MAR Hold] ezetimibe  10 mg Oral Daily  . [MAR Hold] fluticasone  2 spray Each Nare Daily  . [MAR Hold] gabapentin  400 mg Oral BID  . [MAR Hold] gabapentin  800 mg Oral QHS  . [MAR Hold] insulin aspart  0-9 Units Subcutaneous Q4H  . [MAR Hold] insulin glargine  100 Units Subcutaneous Daily  . [MAR Hold] levETIRAcetam  500 mg Oral q morning - 10a  . [MAR Hold] levETIRAcetam  750 mg Oral QHS  . [MAR Hold] losartan  50 mg Oral Daily  . [MAR Hold] mirabegron ER  50 mg Oral Daily  . [MAR Hold] mometasone-formoterol  2 puff Inhalation BID  . [MAR Hold] pantoprazole  40 mg Oral Daily  . pneumococcal 23 valent vaccine  0.5 mL Intramuscular Tomorrow-1000  . [MAR Hold] simvastatin  40 mg Oral Daily  . [MAR Hold] sodium chloride flush  3 mL Intravenous Q12H  . [MAR Hold] spironolactone  25 mg Oral Daily   Infusions:  . sodium chloride    . sodium chloride 1 mL/kg/hr (04/28/19 0756)  . heparin Stopped (04/28/19 0754)    Labs: Recent Labs    04/27/19 0903  NA 140  K 4.0  CL 104  CO2 28  GLUCOSE 197*  BUN 13  CREATININE 0.94  CALCIUM 9.1   No results for input(s): AST, ALT, ALKPHOS, BILITOT, PROT, ALBUMIN in the last 72 hours. Recent Labs    04/27/19 0903 04/28/19 0419  WBC 5.1 7.7  HGB 12.2 12.9  HCT 39.0 40.2  MCV 95.4 92.8  PLT 242 271   No results for input(s): CKTOTAL, CKMB, TROPONINI in the last 72 hours. Invalid input(s): POCBNP No results for input(s): HGBA1C in the last 72 hours.    Weights: Filed Weights   04/27/19 2116 04/28/19 0318 04/28/19 0745  Weight: 105.5 kg 104.5 kg 101.2 kg     Radiology/Studies:  DG Chest 2 View  Result Date: 04/27/2019 CLINICAL DATA:  Chest pain EXAM: CHEST - 2 VIEW COMPARISON:  November 07, 2018 FINDINGS: Lungs are clear. Heart size and pulmonary vascularity are normal. No adenopathy. No pneumothorax. No bone lesions. IMPRESSION: Lungs clear.  Cardiac silhouette within normal limits. Electronically Signed   By: Lowella Grip III M.D.   On: 04/27/2019 09:46   DG Knee Complete 4 Views Left  Result Date: 04/27/2019 CLINICAL DATA:  Patient reports she had a syncopal episode on Saturday and fell onto left knee. Patient reports pain and bruising to left knee since. Also reports difficulty bearing weight. EXAM: LEFT KNEE - COMPLETE 4+ VIEW COMPARISON:  None. FINDINGS: No fracture or bone lesion. Knee joint is normally spaced and aligned. No arthropathic changes. No joint effusion. Mild subcutaneous soft tissue edema is noted along the anterolateral knee. IMPRESSION: No fracture or joint abnormality Electronically Signed   By: Lajean Manes M.D.   On: 04/27/2019 12:28     Assessment and Recommendation  54 y.o. female with known cardiovascular risk factors including hypertension hyperlipidemia having acute's symptoms of chest discomfort without evidence of myocardial infarction now more stable with medication management 1.  Proceed to cardiac catheterization right and left heart catheterization to assess coronary anatomy and further treatment thereof as necessary.  Patient understands the risk and benefits cardiac catheterization.  This includes the possibility of death stroke heart attack infection bleeding or blood clot.  She did is at low risk for conscious sedation  Signed, Serafina Royals M.D. FACC

## 2019-04-29 LAB — BASIC METABOLIC PANEL
Anion gap: 8 (ref 5–15)
BUN: 20 mg/dL (ref 6–20)
CO2: 24 mmol/L (ref 22–32)
Calcium: 8.9 mg/dL (ref 8.9–10.3)
Chloride: 108 mmol/L (ref 98–111)
Creatinine, Ser: 0.91 mg/dL (ref 0.44–1.00)
GFR calc Af Amer: >60 mL/min (ref >60)
GFR calc non Af Amer: >60 mL/min (ref >60)
Glucose, Bld: 173 mg/dL — ABNORMAL HIGH (ref 70–99)
Potassium: 4 mmol/L (ref 3.5–5.1)
Sodium: 140 mmol/L (ref 135–145)

## 2019-04-29 LAB — GLUCOSE, CAPILLARY
Glucose-Capillary: 116 mg/dL — ABNORMAL HIGH (ref 70–99)
Glucose-Capillary: 123 mg/dL — ABNORMAL HIGH (ref 70–99)
Glucose-Capillary: 161 mg/dL — ABNORMAL HIGH (ref 70–99)
Glucose-Capillary: 164 mg/dL — ABNORMAL HIGH (ref 70–99)

## 2019-04-29 LAB — HEMOGLOBIN A1C
Hgb A1c MFr Bld: 8.9 % — ABNORMAL HIGH (ref 4.8–5.6)
Mean Plasma Glucose: 209 mg/dL

## 2019-04-29 MED ORDER — CARVEDILOL 6.25 MG PO TABS: 6.2500 mg | ORAL_TABLET | Freq: Two times a day (BID) | ORAL | 0 refills | Status: DC

## 2019-04-29 MED ORDER — MECLIZINE HCL 25 MG PO TABS
25.0000 mg | ORAL_TABLET | Freq: Once | ORAL | Status: AC
  Administered 2019-04-29: 25 mg via ORAL
  Filled 2019-04-29: qty 1

## 2019-04-29 MED ORDER — NITROGLYCERIN 0.4 MG SL SUBL: 0.4000 mg | SUBLINGUAL_TABLET | SUBLINGUAL | 12 refills | Status: DC | PRN

## 2019-04-29 MED ORDER — HYDRALAZINE HCL 20 MG/ML IJ SOLN
10.0000 mg | Freq: Once | INTRAMUSCULAR | Status: AC
  Administered 2019-04-29: 12:00:00 10 mg via INTRAVENOUS
  Filled 2019-04-29: qty 1

## 2019-04-29 NOTE — Discharge Summary (Signed)
Physician Discharge Summary  [name] CBU:[id number] DOB: [birth year] DOA: 04/27/2019  PCP: Hubbard Hartshorn, [name]  Admit date: 04/27/2019 Discharge date: 04/29/2019  Admitted From: Home Disposition:  Home  Recommendations for Outpatient Follow-up:  1. Follow up with PCP in 1-2 weeks 2. Follow up with Dr. Clayborn Bigness as directed 3. Increase Coreg dose to 6.25 twice a day  Home Health:NO Equipment/Devices:none Discharge Condition:Stable CODE STATUS:FULL Diet recommendation: Heart Healthy / Carb Modified  Brief/Interim Summary:  Karen Alcivar Mobleyis a 54 y.o.femalewith medical history significant ofhypertension, hyperlipidemia, diabetes mellitus, COPD, asthma, stroke, GERD, depression with anxiety, OSA, seizure, kidney stone, CAD, myocardial infarction, and left eye blindness who presented to [facility] ED 4/8 with left arm and chest pain. Troponin not elevated. The patient has had a recent stress test showing a minimal reversible distal anterior myocardial perfusion defect consistent with possible myocardial ischemia. Echocardiogram shows mild global LV systolic dysfunction and some worsening septal dysfunction with moderate tricuspid regurgitation with pulmonary hypertension. Cardiology was consulted, heparin IV started. Left and right heart catheterization was performed 4/9 showing no significant cardiac disease, specifically normal right-sided pressures, preserved LVEF, and no CAD. She remains hypertensive and is drowsy following cardiac catheterization.  4/10: Patient seen and examined on the day of discharge.  Chest pain-free however does endorse some dizziness and was hypertensive this morning.  Patient monitored in house until mid afternoon.  Required a one-time administration of IV hydralazine.  Home dose of Coreg has been increased to 6.25 mg twice a day.  Had a lengthy discussion with the patient regarding control of her vascular risk factors, specifically her diabetes.  Patient's  hemoglobin A1c is 12.6.  She says she does have an endocrinologist.  I told her to go see her endocrinologist as she needs urgent titration of her current insulin regimen.  All questions answered, patient expressed understanding.  Discharge Diagnoses:  Principal Problem:   Chest pain Active Problems:   Hyperlipemia   Essential hypertension   GERD   Seizure disorder (HCC)   History of CVA (cerebrovascular accident)   Type 2 diabetes mellitus without complication, with long-term current use of insulin (HCC)   COPD (chronic obstructive pulmonary disease) (HCC)   CAD (coronary artery disease)  Left chest, arm pain, history of CAD, HTN:  Without significant coronary artery disease on LHC. Has been relatively chronic.  This is tender to palpation in some areas, so could consider fibromyalgia, MSK etiology, and/or opioid-induced hyperalgesia in setting of chornic opioid use. - Negative cath, normal EF - Consider deescalation of opioids, defer to outpatient - ensure control of vascular risk factors  HTN:  - Continue losartan, spironolactone, and increase coreg.   COPD: No wheezing - Continue bronchodilators - Resume home regimen on dc  T2DM: Uncontrolled with hyperglycemia, HbA1c 12.6%.  -Patient states she has an endocrinologist.  I encouraged her to see her endocrinologist urgently after discharge and she needs titration of her insulin regimen.  History of CVA:  - Plavix, lipid reduction, blood sugar improvement.   GERD:  - Continue PPI  Seizure disorder:  - Precautions - Continue keppra  HLD:  - Continue statin  Discharge Instructions  Discharge Instructions    Diet - low sodium heart healthy   Complete by: As directed    Increase activity slowly   Complete by: As directed      Allergies as of 04/29/2019      Reactions   Codeine Swelling, Other (See Comments)   Swelling and burning of mouth (  inside)   Lactose Intolerance (gi) Nausea And Vomiting       Medication List    TAKE these medications   albuterol 108 (90 Base) MCG/ACT inhaler Commonly known as: ProAir HFA INHALE 2 PUFFS BY MOUTH EVERY 6 HOURS AS NEEDED FOR WHEEZING OR SHORTNESS OF BREATH   albuterol 1.25 MG/3ML nebulizer solution Commonly known as: ACCUNEB Inhale into the lungs. Use 3 mls by nebulization every 6 hours as needed for wheezing   AMBULATORY NON FORMULARY MEDICATION Medication Name: Muscle and Joint Vanishing Scent Gel- Apply as needed to skin   budesonide 0.5 MG/2ML nebulizer solution Commonly known as: PULMICORT Inhale 0.5 mg into the lungs daily. Take 2 mls by nebulizations once daily   CALCIUM-VITAMIN D3 PO D3 500- calcium 630 mg Take 1 capsule by mouth once daily   carvedilol 6.25 MG tablet Commonly known as: COREG Take 1 tablet (6.25 mg total) by mouth 2 (two) times daily after a meal. What changed:   medication strength  how much to take  when to take this   clopidogrel 75 MG tablet Commonly known as: PLAVIX Take 1 tablet (75 mg total) by mouth daily.   cycloSPORINE 0.05 % ophthalmic emulsion Commonly known as: RESTASIS Place 2 drops into both eyes 2 (two) times daily.   Dulaglutide 1.5 MG/0.5ML Sopn Inject 1.5 mg into the skin once a week.   DULoxetine 20 MG capsule Commonly known as: Cymbalta Take 2 capsules (40 mg total) by mouth daily.   Dupixent 300 MG/2ML Sopn Generic drug: Dupilumab Inject 300 mg into the skin every 14 (fourteen) days.   EPINEPHrine 0.3 mg/0.3 mL Soaj injection Commonly known as: EPI-PEN Inject 0.3 mg into the muscle as needed for anaphylaxis.   ezetimibe 10 MG tablet Commonly known as: Zetia Take 1 tablet (10 mg total) by mouth daily.   fluticasone 50 MCG/ACT nasal spray Commonly known as: FLONASE Place 2 sprays into both nostrils daily.   gabapentin 400 MG capsule Commonly known as: NEURONTIN 400 mg qAM, 400 mg qPM, 800 mg qhs   insulin glargine (2 Unit Dial) 300 UNIT/ML Solostar  Pen Commonly known as: TOUJEO MAX Inject 120 Units into the skin daily. What changed: how much to take   levETIRAcetam 500 MG tablet Commonly known as: KEPPRA Take 500 mg by mouth 2 (two) times daily. 1 TAB IN THE MORNING AND 1.5 TABS AT NIGHT   losartan 50 MG tablet Commonly known as: COZAAR Take 50 mg by mouth daily.   mirabegron ER 50 MG Tb24 tablet Commonly known as: Myrbetriq Take 1 tablet (50 mg total) by mouth daily.   nitroGLYCERIN 0.4 MG SL tablet Commonly known as: NITROSTAT Place 1 tablet (0.4 mg total) under the tongue every 5 (five) minutes as needed for chest pain.   NovoLOG FlexPen 100 UNIT/ML FlexPen Generic drug: insulin aspart Inject 18-50 Units into the skin 3 (three) times daily with meals.   ondansetron 4 MG disintegrating tablet Commonly known as: Zofran ODT Take 1 tablet (4 mg total) by mouth every 8 (eight) hours as needed for nausea or vomiting.   oxyCODONE-acetaminophen 5-325 MG tablet Commonly known as: Percocet Take 1 tablet by mouth every 12 (twelve) hours as needed for severe pain.   pantoprazole 40 MG tablet Commonly known as: PROTONIX Take 1 tablet (40 mg total) by mouth daily.   polyethylene glycol 17 g packet Commonly known as: MIRALAX / GLYCOLAX Take 17 g by mouth daily as needed for mild constipation.   potassium  chloride SA 20 MEQ tablet Commonly known as: Klor-Con M20 Take 2 tablets (40 mEq total) by mouth daily.   predniSONE 20 MG tablet Commonly known as: DELTASONE Take 20 mg by mouth daily.   simvastatin 40 MG tablet Commonly known as: ZOCOR Take 40 mg by mouth daily.   spironolactone 25 MG tablet Commonly known as: ALDACTONE Take 1 tablet by mouth daily.   terconazole 0.4 % vaginal cream Commonly known as: TERAZOL 7 Place 1 applicator vaginally at bedtime as needed (irritation).   Wixela Inhub 100-50 MCG/DOSE Aepb Generic drug: Fluticasone-Salmeterol Inhale 1 puff into the lungs 2 (two) times daily.       Follow-up Information    Hubbard Hartshorn, [name]. Schedule an appointment as soon as possible for a visit in 1 week(s).   Specialty: Family Medicine Contact information: [address] [place] [address] [phone]        Yolonda Kida, MD Follow up.   Specialties: Cardiology, Internal Medicine Why: Follow up as directed by Dr. Christiane Ha information: [place] [id number] [phone]          Allergies  Allergen Reactions  . Codeine Swelling and Other (See Comments)    Swelling and burning of mouth (inside)  . Lactose Intolerance (Gi) Nausea And Vomiting    Consultations:  Cardiology- [facility]   Procedures/Studies: DG Chest 2 View  Result Date: 04/27/2019 CLINICAL DATA:  Chest pain EXAM: CHEST - 2 VIEW COMPARISON:  November 07, 2018 FINDINGS: Lungs are clear. Heart size and pulmonary vascularity are normal. No adenopathy. No pneumothorax. No bone lesions. IMPRESSION: Lungs clear.  Cardiac silhouette within normal limits. Electronically Signed   By: Lowella Grip III M.D.   On: 04/27/2019 09:46   CARDIAC CATHETERIZATION  Result Date: 08/23/8848  LV end diastolic pressure is normal.  Normal left ventricular function of 60%  Normal right sided pressures no evidence of pulmonary hypertension  Normal coronaries  Conclusion Normal right-sided heart cath Wedge mean of 10 PA mean of 22 Normal left ventricular function of 60% Normal coronaries   DG Knee Complete 4 Views Left  Result Date: 04/27/2019 CLINICAL DATA:  Patient reports she had a syncopal episode on Saturday and fell onto left knee. Patient reports pain and bruising to left knee since. Also reports difficulty bearing weight. EXAM: LEFT KNEE - COMPLETE 4+ VIEW COMPARISON:  None. FINDINGS: No fracture or bone lesion. Knee joint is normally spaced and aligned. No arthropathic changes. No joint effusion. Mild subcutaneous soft tissue edema is noted along the  anterolateral knee. IMPRESSION: No fracture or joint abnormality Electronically Signed   By: Lajean Manes M.D.   On: 04/27/2019 12:28    (Echo, Carotid, EGD, Colonoscopy, ERCP)    Subjective: Seen and examined on the day of dischage Endorsing some dizziness Medically stable for discharge home  Discharge Exam: Vitals:   04/29/19 1107 04/29/19 1324  BP: (!) 150/112 (!) 146/98  Pulse:    Resp:    Temp:    SpO2:     Vitals:   04/29/19 0444 04/29/19 0724 04/29/19 1107 04/29/19 1324  BP: 130/76 (!) 157/101 (!) 150/112 (!) 146/98  Pulse: 88 81    Resp: 18 20    Temp: 98.3 F (36.8 C) 98.3 F (36.8 C)    TempSrc: Oral Oral    SpO2: 98% 99%    Weight: 106.3 kg     Height:        General: Pt  is alert, awake, not in acute distress Cardiovascular: RRR, S1/S2 +, no rubs, no gallops Respiratory: CTA bilaterally, no wheezing, no rhonchi Abdominal: Soft, NT, ND, bowel sounds + Extremities: no edema, no cyanosis    The results of significant diagnostics from this hospitalization (including imaging, microbiology, ancillary and laboratory) are listed below for reference.     Microbiology: Recent Results (from the past 240 hour(s))  Respiratory Panel by RT PCR (Flu A&B, Covid) - Nasopharyngeal Swab     Status: None   Collection Time: 04/27/19 11:50 AM   Specimen: Nasopharyngeal Swab  Result Value Ref Range Status   SARS Coronavirus 2 by RT PCR NEGATIVE NEGATIVE Final    Comment: (NOTE) SARS-CoV-2 target nucleic acids are NOT DETECTED. The SARS-CoV-2 RNA is generally detectable in upper respiratoy specimens during the acute phase of infection. The lowest concentration of SARS-CoV-2 viral copies this assay can detect is 131 copies/mL. A negative result does not preclude SARS-Cov-2 infection and should not be used as the sole basis for treatment or other patient management decisions. A negative result may occur with  improper specimen collection/handling, submission of specimen  other than nasopharyngeal swab, presence of viral mutation(s) within the areas targeted by this assay, and inadequate number of viral copies (<131 copies/mL). A negative result must be combined with clinical observations, patient history, and epidemiological information. The expected result is Negative. Fact Sheet for Patients:  PinkCheek.be Fact Sheet for Healthcare Providers:  GravelBags.it This test is not yet ap proved or cleared by the [place] FDA and  has been authorized for detection and/or diagnosis of SARS-CoV-2 by FDA under an Emergency Use Authorization (EUA). This EUA will remain  in effect (meaning this test can be used) for the duration of the COVID-19 declaration under Section 564(b)(1) of the Act, 21 U.S.C. section 360bbb-3(b)(1), unless the authorization is terminated or revoked sooner.    Influenza A by PCR NEGATIVE NEGATIVE Final   Influenza B by PCR NEGATIVE NEGATIVE Final    Comment: (NOTE) The Xpert Xpress SARS-CoV-2/FLU/RSV assay is intended as an aid in  the diagnosis of influenza from Nasopharyngeal swab specimens and  should not be used as a sole basis for treatment. Nasal washings and  aspirates are unacceptable for Xpert Xpress SARS-CoV-2/FLU/RSV  testing. Fact Sheet for Patients: PinkCheek.be Fact Sheet for Healthcare Providers: GravelBags.it This test is not yet approved or cleared by the [place] FDA and  has been authorized for detection and/or diagnosis of SARS-CoV-2 by  FDA under an Emergency Use Authorization (EUA). This EUA will remain  in effect (meaning this test can be used) for the duration of the  Covid-19 declaration under Section 564(b)(1) of the Act, 21  U.S.C. section 360bbb-3(b)(1), unless the authorization is  terminated or revoked. Performed at [facility], [address] [place], [place]  [address]      Labs: BNP (last 3 results) Recent Labs    04/12/19 1306  BNP 46.5   Basic Metabolic Panel: Recent Labs  Lab 04/27/19 0903 04/29/19 0449  NA 140 140  K 4.0 4.0  CL 104 108  CO2 28 24  GLUCOSE 197* 173*  BUN 13 20  CREATININE 0.94 0.91  CALCIUM 9.1 8.9   Liver Function Tests: No results for input(s): AST, ALT, ALKPHOS, BILITOT, PROT, ALBUMIN in the last 168 hours. No results for input(s): LIPASE, AMYLASE in the last 168 hours. No results for input(s): AMMONIA in the last 168 hours. CBC: Recent Labs  Lab 04/27/19 [phone]  04/28/19 0419  WBC 5.1 7.7  HGB 12.2 12.9  HCT 39.0 40.2  MCV 95.4 92.8  PLT 242 271   Cardiac Enzymes: No results for input(s): CKTOTAL, CKMB, CKMBINDEX, TROPONINI in the last 168 hours. BNP: Invalid input(s): POCBNP CBG: Recent Labs  Lab 04/28/19 1949 04/29/19 0016 04/29/19 0444 04/29/19 0725 04/29/19 1153  GLUCAP 143* 164* 161* 123* 116*   D-Dimer No results for input(s): DDIMER in the last 72 hours. Hgb A1c Recent Labs    04/28/19 0419  HGBA1C 8.9*   Lipid Profile Recent Labs    04/28/19 0419  CHOL 155  HDL 43  LDLCALC 92  TRIG 102  CHOLHDL 3.6   Thyroid function studies No results for input(s): TSH, T4TOTAL, T3FREE, THYROIDAB in the last 72 hours.  Invalid input(s): FREET3 Anemia work up No results for input(s): VITAMINB12, FOLATE, FERRITIN, TIBC, IRON, RETICCTPCT in the last 72 hours. Urinalysis    Component Value Date/Time   COLORURINE YELLOW (A) 12/05/2018 1310   APPEARANCEUR HAZY (A) 12/05/2018 1310   LABSPEC 1.021 12/05/2018 1310   PHURINE 5.0 12/05/2018 1310   GLUCOSEU 50 (A) 12/05/2018 1310   HGBUR NEGATIVE 12/05/2018 1310   HGBUR negative 09/30/2006 1400   BILIRUBINUR NEGATIVE 12/05/2018 1310   BILIRUBINUR neg 11/11/2016 1214   KETONESUR NEGATIVE 12/05/2018 1310   PROTEINUR 30 (A) 12/05/2018 1310   UROBILINOGEN 0.2 11/11/2016 1214   UROBILINOGEN 0.2 06/18/2014 0028   NITRITE NEGATIVE  12/05/2018 1310   LEUKOCYTESUR LARGE (A) 12/05/2018 1310   Sepsis Labs Invalid input(s): PROCALCITONIN,  WBC,  LACTICIDVEN Microbiology Recent Results (from the past 240 hour(s))  Respiratory Panel by RT PCR (Flu A&B, Covid) - Nasopharyngeal Swab     Status: None   Collection Time: 04/27/19 11:50 AM   Specimen: Nasopharyngeal Swab  Result Value Ref Range Status   SARS Coronavirus 2 by RT PCR NEGATIVE NEGATIVE Final    Comment: (NOTE) SARS-CoV-2 target nucleic acids are NOT DETECTED. The SARS-CoV-2 RNA is generally detectable in upper respiratoy specimens during the acute phase of infection. The lowest concentration of SARS-CoV-2 viral copies this assay can detect is 131 copies/mL. A negative result does not preclude SARS-Cov-2 infection and should not be used as the sole basis for treatment or other patient management decisions. A negative result may occur with  improper specimen collection/handling, submission of specimen other than nasopharyngeal swab, presence of viral mutation(s) within the areas targeted by this assay, and inadequate number of viral copies (<131 copies/mL). A negative result must be combined with clinical observations, patient history, and epidemiological information. The expected result is Negative. Fact Sheet for Patients:  PinkCheek.be Fact Sheet for Healthcare Providers:  GravelBags.it This test is not yet ap proved or cleared by the [place] FDA and  has been authorized for detection and/or diagnosis of SARS-CoV-2 by FDA under an Emergency Use Authorization (EUA). This EUA will remain  in effect (meaning this test can be used) for the duration of the COVID-19 declaration under Section 564(b)(1) of the Act, 21 U.S.C. section 360bbb-3(b)(1), unless the authorization is terminated or revoked sooner.    Influenza A by PCR NEGATIVE NEGATIVE Final   Influenza B by PCR NEGATIVE NEGATIVE Final     Comment: (NOTE) The Xpert Xpress SARS-CoV-2/FLU/RSV assay is intended as an aid in  the diagnosis of influenza from Nasopharyngeal swab specimens and  should not be used as a sole basis for treatment. Nasal washings and  aspirates are unacceptable for Xpert Xpress SARS-CoV-2/FLU/RSV  testing.  Fact Sheet for Patients: PinkCheek.be Fact Sheet for Healthcare Providers: GravelBags.it This test is not yet approved or cleared by the [place] FDA and  has been authorized for detection and/or diagnosis of SARS-CoV-2 by  FDA under an Emergency Use Authorization (EUA). This EUA will remain  in effect (meaning this test can be used) for the duration of the  Covid-19 declaration under Section 564(b)(1) of the Act, 21  U.S.C. section 360bbb-3(b)(1), unless the authorization is  terminated or revoked. Performed at [facility], [address] [place], [place] [address]      Time coordinating discharge: Over 30 minutes  SIGNED:   Sidney Ace, MD  [facility] 04/29/2019, 2:55 PM Pager   If 7PM-7AM, please contact night-coverage

## 2019-04-29 NOTE — Progress Notes (Signed)
[name] to be D/C'd Home per MD order. Patient given discharge teaching and paperwork regarding medications, diet, follow-up appointments and activity. Patient understanding verbalized. No questions or complaints at this time. Skin condition as charted. IV and telemetry removed prior to leaving.  No further needs by Care Management/Social Work.  An After Visit Summary was printed and given to the patient.   Patient escorted via wheelchair by NT.  Terrilyn Saver

## 2019-05-01 ENCOUNTER — Telehealth: Payer: Self-pay

## 2019-05-01 NOTE — Telephone Encounter (Signed)
Transition Care Management Follow-up Telephone Call  Date of discharge and from where: 04/29/19 [facility]  How have you been since you were released from the hospital? Pt states she is doing okay  Any questions or concerns? No   Items Reviewed:  Did the pt receive and understand the discharge instructions provided? Yes   Medications obtained and verified? Yes   Any new allergies since your discharge? No   Dietary orders reviewed? Yes  Do you have support at home? Yes   Functional Questionnaire: (I = Independent and D = Dependent) ADLs: I  Bathing/Dressing- I  Meal Prep- I  Eating- I  Maintaining continence- I  Transferring/Ambulation- I  Managing Meds- I  Follow up appointments reviewed:   PCP Hospital f/u appt confirmed? Yes  Scheduled to see Dr. Roxan Hockey on 05/05/19 @ 9:20.  [facility] f/u appt confirmed? No  Pt to call and schedule appt with Dr. Clayborn Bigness for cardiology. Confirmed patient has their contact information.   Are transportation arrangements needed? No   If their condition worsens, is the pt aware to call PCP or go to the Emergency Dept.? Yes  Was the patient provided with contact information for the PCP's office or ED? Yes  Was to pt encouraged to call back with questions or concerns? Yes

## 2019-05-04 ENCOUNTER — Encounter: Admission: RE | Payer: Self-pay | Source: Home / Self Care

## 2019-05-04 ENCOUNTER — Ambulatory Visit: Admission: RE | Admit: 2019-05-04 | Payer: Medicare HMO | Source: Home / Self Care | Admitting: Internal Medicine

## 2019-05-04 SURGERY — RIGHT/LEFT HEART CATH AND CORONARY ANGIOGRAPHY
Anesthesia: Moderate Sedation

## 2019-05-04 NOTE — Progress Notes (Signed)
Patient ID: [name], female    DOB: [birth year], 54 y.o.   MRN: [id number]  PCP: Hubbard Hartshorn, [name]  Chief Complaint  Patient presents with  . Hospitalization Follow-up  . Arm Pain    left arm, onset 04/26/19  . Chest Pain    has been continuos since ED visit 04/27/19  . Back Pain    down the middle and across lower back, onset 04/26/19    Subjective:   [name] is a 54 y.o. female, presents to clinic with CC of the following:  Chief Complaint  Patient presents with  . Hospitalization Follow-up  . Arm Pain    left arm, onset 04/26/19  . Chest Pain    has been continuos since ED visit 04/27/19  . Back Pain    down the middle and across lower back, onset 04/26/19    HPI:  Patient is a 54 y.o. female with a complicated medical history and medication list, with multiple providers involved who I first met in February with that note reviewed and my last interaction with her was a telemedicine visit at the end of March, with that note thoroughly reviewed. She sees pulmonary, cardiology, neurology, and endocrinology in the [place] system  The CCM team has been consulted in the past and enrolled to help.  She last saw cardiology 04/12/2019, with a cardiac catheterization scheduled in April due to chest pain concerns, arm pains, an ? Blockage.  She subsequently went to the emergency department before that scheduled catheterization with chest pains.  She follows up today after the recent hospitalization from 4/8-4/10/2018. This was summarized as below:  [name] a 54 y.o.femalewith medical history significant ofhypertension, hyperlipidemia, diabetes mellitus, COPD, asthma, stroke, GERD, depression with anxiety, OSA, seizure, kidney stone, CAD, myocardial infarction,andleft eye blindness who presented to [facility] ED 4/8 with left arm and chest pain. Troponin not elevated.The patient has had a recent stress test showing a minimal reversible distal  anterior myocardial perfusion defect consistent with possible myocardial ischemia. Echocardiogram shows mild global LV systolic dysfunction and some worsening septal dysfunction with moderate tricuspid regurgitation with pulmonary hypertension.Cardiology was consulted, heparin IV started. Left and right heart catheterization was performed 4/9 showing no significant cardiac disease, specifically normal right-sided pressures, preserved LVEF, and no CAD. She remains hypertensive and is drowsy following cardiac catheterization.  4/10: Patient seen and examined on the day of discharge.  Chest pain-free however does endorse some dizziness and was hypertensive this morning.  Patient monitored in house until mid afternoon.  Required a one-time administration of IV hydralazine.  Home dose of Coreg has been increased to 6.25 mg twice a day.  Had a lengthy discussion with the patient regarding control of her vascular risk factors, specifically her diabetes.  Patient's hemoglobin A1c is 12.6.  She says she does have an endocrinologist.      Recent Chest Pain/left shoulder and arm pain   Negative cath, normal EF as noted above. She notes that her pain has continued after discharge from the hospital, feels it on the left side of her chest, also on her left shoulder and upper back area and extends down the muscles of the back.  More painful when she presses on these areas, and also hurts to try to lift her arm much above 90 degrees.  Complicating the picture is her longstanding history of left sided weakness for almost 30 years, and a history of multiple strokes in the past in  addition to her neuropathy history.  She also has a history of fibromyalgia and has been seeing chronic pain management.  Her last visit with them was earlier in the month, with a 36-monthfollow-up after.  HTN:  Blood pressure has been elevated on recent visits, and also when she was in the hospital, with her Coreg dose increased to 6.25, and  then to twice a day.  She states she has been taking that medicine routinely. Medication regimen - losartan, spironolactone, and increased coreg.  BP Readings from Last 3 Encounters:  05/05/19 (!) 170/78  04/29/19 (!) 146/98  04/18/19 (!) 174/100  She denied marked increased headaches or vision problems.  COPD:  On bronchodilators and denies marked shortness of breath recently.  T2DM: Uncontrolled with hyperglycemia,  A1c was noted to be 12.6 at this recent hospitalization, although I do not see that lab in epic.  Patient noted she was told it was that high, and needed to follow-up with endocrine after leaving the hospital. The last hemoglobin A1c check I have from 04/28/2019 was 8.9, with a 12.6 reading noted previous to that.  She notes on checking her blood sugars at home, which she does 3-4 times a day, she has been between 59 at the lowest and 230 at the highest.  I noted concerns with the 59 blood sugar and trying to not have sugars the in the 50s or lower.  Reviewed her current regimen today - Trulicity, Toujeo max and novolog insulin I do not feel like increasing her regimen is appropriate presently, and she has an appointment with endocrinology later this month and await their input.  Lab Results  Component Value Date   HGBA1C 8.9 (H) 04/28/2019  The A/P from their most recent visit is as noted: Diabetes -  - Diabetes is uncontrolled, likely due in part to lack of consistent blood sugar monitoring and medication administration before meals, as well as possible diet indiscretions..  I will adjust medication regimen as follows to prevent hypoglycemia:  -DecreaseNovologfrom 22 down to 18 units before meals, and decreasesliding scale before meals - ContinueToujeo Max 136 units- in theevening - ContinueTrulicity 1.5 mg weekly on Sunday  Patient was advised of the importance of regular monitoring of blood sugars.  - Instructed to monitor blood sugars 4 times daily,  fasting in the morning, before lunch, before dinner and again before bed. - Reminded to bring blood sugar log and/or meter to every visit -Patient has previously stated her interest in a [employer] glucose monitoring system. I have advised her that she will need to have evidence of 4 blood sugar checks daily as well as insulin injections in order to qualify for insurance coverage. She states her understanding of this requirement and that she will monitor her blood sugars as requested and bring her blood sugar log to next clinic visit.  History of CVA/neuropathy/seizure hx:  Continues on Plavix Last saw neurology 09/22/2018, with the following A/P from that visit:  HISTORY OF CVA - Stable.  - Patient with history of multiple strokes, last stroke in 2013. Denies recent stroke-like symptoms. Patient has been seen by cardiology since her last visit.  - Continue Plavix 75 mg daily for secondary stroke prevention.  - Continue working with endocrinology for diabetes management.  Patient states no new stroke like symptoms. Taking plavix 75 mg no side effects. Seen a cardiologist. Taking Keppra 500 mg no side effects HISTORY OF SEIZURES - Stable.  - Patient with long history of lifetime seizures.  Denies recent seizure-like activity.  - Will repeat EEG within the next 3-6 months.  Patient states no new seizures. Last seizure was when she has done the stress test. Has had a EEG.  DIABETIC NEUROPATHY - New. - Patient with history of diabetic neuropathy. - Increase gabapentin to 400 mg three times a day.  - May consider EMG (electromyography) of lower extremity at next appointment.  Patient states left side feels numbness and tingling from left shoulder to left foot. No falls. Taking gabapentin 400 mg 3 times day no side effects   GERD:  Continues on a PPI, and does note some intermittent epigastric discomfort consistent with reflux, although notes that that pain is clearly different from the  pain she has been having in the more recent past.  She does note the PPI is helpful.  HLD:  Currently on a statin and zetiavproduct. Lab Results  Component Value Date   CHOL 155 04/28/2019   HDL 43 04/28/2019   LDLCALC 92 04/28/2019   TRIG 102 04/28/2019   CHOLHDL 3.6 04/28/2019    Patient Active Problem List   Diagnosis Date Noted  . Chest pain 04/27/2019  . Type 2 diabetes mellitus without complication, with long-term current use of insulin ([place]) 04/27/2019  . COPD (chronic obstructive pulmonary disease) ([place]) 04/27/2019  . CAD (coronary artery disease) 04/27/2019  . H/O: stroke with residual effects   . Myalgia 04/19/2019  . 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 Non-intractable vomiting 03/01/2019  . History of CVA (cerebrovascular accident) 07/08/2018  . History of seizure 07/08/2018  . Diabetic polyneuropathy associated with type 2 diabetes mellitus ([place]) 03/02/2018  . Obesity (BMI 30-39.9) 03/02/2018  . Vitamin D deficiency 01/24/2018  . Vulvovaginal candidiasis 01/24/2018  . Hypokalemia 06/08/2017  . Overactive bladder 05/18/2017  . Asthma 06/22/2016  . Dilated aortic root ([place])   . Sacroiliac dysfunction 10/09/2014  . Chronic low back pain 09/03/2014  . CVA, old, hemiparesis ([place]) 09/03/2014  . OSA (obstructive sleep apnea) 05/15/2014  . Restrictive airway disease 05/15/2014  . Colitis 02/22/2014  . Type 2 diabetes, uncontrolled, with renal manifestation ([place]) 01/21/2014  . Chronic pain syndrome 01/21/2014  . Headache disorder 09/07/2013  . Diabetic neuropathy ([place]) 05/23/2013  . Mixed urge and stress incontinence 08/25/2011  . Ureteral stricture, right 08/25/2011  . Pyelonephritis 08/22/2011  . Nephrolithiasis 08/22/2011  . Incisional hernia 05/10/2011  . Morbid obesity ([place]) 05/10/2011  . Tachycardia 05/08/2011  . BLINDNESS, [place], [place] DEFINITION 09/14/2006  . Hyperlipemia 09/13/2006  . Essential hypertension 09/13/2006  . MYOCARDIAL INFARCTION, HX OF  09/13/2006  . GERD 09/13/2006  . Seizure disorder ([place]) 09/13/2006      Current Outpatient Medications:  .  albuterol (ACCUNEB) 1.25 MG/3ML nebulizer solution, Inhale into the lungs. Use 3 mls by nebulization every 6 hours as needed for wheezing, Disp: , Rfl:  .  albuterol (PROAIR HFA) 108 (90 Base) MCG/ACT inhaler, INHALE 2 PUFFS BY MOUTH EVERY 6 HOURS AS NEEDED FOR WHEEZING OR SHORTNESS OF BREATH, Disp: 3 Inhaler, Rfl: 3 .  AMBULATORY NON FORMULARY MEDICATION, Medication Name: Muscle and Joint Vanishing Scent Gel- Apply as needed to skin, Disp: , Rfl:  .  budesonide (PULMICORT) 0.5 MG/2ML nebulizer solution, Inhale 0.5 mg into the lungs daily. Take 2 mls by nebulizations once daily , Disp: , Rfl:  .  Calcium Carbonate-Vitamin D (CALCIUM-VITAMIN D3 PO), D3 500- calcium 630 mg Take 1 capsule by mouth once daily, Disp: , Rfl:  .  carvedilol (COREG) 6.25 MG  tablet, Take 1 tablet (6.25 mg total) by mouth 2 (two) times daily after a meal., Disp: 60 tablet, Rfl: 0 .  clopidogrel (PLAVIX) 75 MG tablet, Take 1 tablet (75 mg total) by mouth daily., Disp: 90 tablet, Rfl: 0 .  cycloSPORINE (RESTASIS) 0.05 % ophthalmic emulsion, Place 2 drops into both eyes 2 (two) times daily., Disp: , Rfl:  .  Dulaglutide 1.5 MG/0.5ML SOPN, Inject 1.5 mg into the skin once a week., Disp: 12 pen, Rfl: 3 .  DULoxetine (CYMBALTA) 20 MG capsule, Take 2 capsules (40 mg total) by mouth daily., Disp: 60 capsule, Rfl: 2 .  DUPIXENT 300 MG/2ML SOPN, Inject 300 mg into the skin every 14 (fourteen) days. , Disp: , Rfl:  .  ezetimibe (ZETIA) 10 MG tablet, Take 1 tablet (10 mg total) by mouth daily., Disp: 90 tablet, Rfl: 3 .  fluticasone (FLONASE) 50 MCG/ACT nasal spray, Place 2 sprays into both nostrils daily., Disp: 16 g, Rfl: 3 .  Fluticasone-Salmeterol (WIXELA INHUB) 100-50 MCG/DOSE AEPB, Inhale 1 puff into the lungs 2 (two) times daily., Disp: , Rfl:  .  gabapentin (NEURONTIN) 400 MG capsule, 400 mg qAM, 400 mg qPM, 800 mg  qhs, Disp: 120 capsule, Rfl: 2 .  insulin aspart (NOVOLOG FLEXPEN) 100 UNIT/ML FlexPen, Inject 18-50 Units into the skin 3 (three) times daily with meals. , Disp: , Rfl:  .  Insulin Glargine, 2 Unit Dial, 300 UNIT/ML SOPN, Inject 120 Units into the skin daily. (Patient taking differently: Inject 136 Units into the skin daily. ), Disp: 15 mL, Rfl: 1 .  levETIRAcetam (KEPPRA) 500 MG tablet, Take 500 mg by mouth 2 (two) times daily. 1 TAB IN THE MORNING AND 1.5 TABS AT NIGHT, Disp: , Rfl:  .  losartan (COZAAR) 50 MG tablet, Take 50 mg by mouth daily., Disp: , Rfl:  .  mirabegron ER (MYRBETRIQ) 50 MG TB24 tablet, Take 1 tablet (50 mg total) by mouth daily., Disp: 90 tablet, Rfl: 1 .  ondansetron (ZOFRAN ODT) 4 MG disintegrating tablet, Take 1 tablet (4 mg total) by mouth every 8 (eight) hours as needed for nausea or vomiting., Disp: 20 tablet, Rfl: 1 .  oxyCODONE-acetaminophen (PERCOCET) 5-325 MG tablet, Take 1 tablet by mouth every 12 (twelve) hours as needed for severe pain., Disp: 30 tablet, Rfl: 0 .  pantoprazole (PROTONIX) 40 MG tablet, Take 1 tablet (40 mg total) by mouth daily., Disp: 90 tablet, Rfl: 0 .  polyethylene glycol (MIRALAX / GLYCOLAX) packet, Take 17 g by mouth daily as needed for mild constipation., Disp: 100 each, Rfl: 3 .  potassium chloride SA (KLOR-CON M20) 20 MEQ tablet, Take 2 tablets (40 mEq total) by mouth daily., Disp: 60 tablet, Rfl: 5 .  predniSONE (DELTASONE) 20 MG tablet, Take 20 mg by mouth daily. , Disp: , Rfl:  .  simvastatin (ZOCOR) 40 MG tablet, Take 40 mg by mouth daily., Disp: , Rfl:  .  spironolactone (ALDACTONE) 25 MG tablet, Take 1 tablet by mouth daily., Disp: , Rfl:  .  terconazole (TERAZOL 7) 0.4 % vaginal cream, Place 1 applicator vaginally at bedtime as needed (irritation)., Disp: , Rfl:  .  EPINEPHrine 0.3 mg/0.3 mL IJ SOAJ injection, Inject 0.3 mg into the muscle as needed for anaphylaxis. , Disp: , Rfl:  .  nitroGLYCERIN (NITROSTAT) 0.4 MG SL tablet,  Place 1 tablet (0.4 mg total) under the tongue every 5 (five) minutes as needed for chest pain. (Patient not taking: Reported on 05/05/2019), Disp: 90  tablet, Rfl: 12   Allergies  Allergen Reactions  . Codeine Swelling and Other (See Comments)    Swelling and burning of mouth (inside)  . Lactose Intolerance (Gi) Nausea And Vomiting     Past Surgical History:  Procedure Laterality Date  . BRAIN HEMATOMA EVACUATION     five procedures total, first procedure when 55 months old, last at 54 years of age.  . CYSTOSCOPY W/ RETROGRADES  09/24/2011   Procedure: CYSTOSCOPY WITH RETROGRADE PYELOGRAM;  Surgeon: Malka So, MD;  Location: [facility];  Service: Urology;  Laterality: Right;  . CYSTOSCOPY WITH URETEROSCOPY  09/24/2011   Procedure: CYSTOSCOPY WITH URETEROSCOPY;  Surgeon: Malka So, MD;  Location: [facility];  Service: Urology;  Laterality: Right;  Balloon dilation right ureter   . CYSTOSCOPY/RETROGRADE/URETEROSCOPY  08/24/2011   Procedure: CYSTOSCOPY/RETROGRADE/URETEROSCOPY;  Surgeon: Molli Hazard, MD;  Location: [facility];  Service: Urology;  Laterality: Right;  Cysto, Right retrograde Pyelogram, right stent placement.   Fatima Blank HERNIA REPAIR  05/05/2011   Procedure: LAPAROSCOPIC INCISIONAL HERNIA;  Surgeon: Rolm Bookbinder, MD;  Location: [facility];  Service: General;  Laterality: N/A;  . kidney stone removal    . MASS EXCISION  05/05/2011   Procedure: EXCISION MASS;  Surgeon: Rolm Bookbinder, MD;  Location: [facility];  Service: General;  Laterality: Right;  . PCNL    . RETINAL DETACHMENT SURGERY Left 1990  . RIGHT/LEFT HEART CATH AND CORONARY ANGIOGRAPHY N/A 04/28/2019   Procedure: RIGHT/LEFT HEART CATH AND CORONARY ANGIOGRAPHY;  Surgeon: Yolonda Kida, MD;  Location: [facility];  Service: Cardiovascular;  Laterality: N/A;  . SHUNT REMOVAL     shunt inserted at age 9 removed at age 29   . VAGINAL HYSTERECTOMY  1996     Family History  Problem Relation Age of Onset  .  Uterine cancer Mother   . Hypertension Mother   . Cancer Mother   . Brain cancer Maternal Grandmother   . Hypertension Maternal Grandmother   . Birth defects Daughter   . Hypertension Daughter   . Cirrhosis Maternal Grandfather   . ADD / ADHD Maternal Grandfather   . Birth defects Maternal Grandfather   . Diabetes Maternal Grandfather   . Anesthesia problems Neg Hx   . Colon cancer Neg Hx   . Esophageal cancer Neg Hx   . Pancreatic cancer Neg Hx      Social History   Tobacco Use  . Smoking status: Former Smoker    Packs/day: 1.00    Years: 15.00    Pack years: 15.00    Types: Cigarettes    Quit date: 01/20/1987    Years since quitting: 32.3  . Smokeless tobacco: Never Used  . Tobacco comment: quit more than 20 years  Substance Use Topics  . Alcohol use: Never    Alcohol/week: 0.0 standard drinks    With staff assistance, above reviewed with the patient today.  ROS: As per HPI, otherwise no specific complaints on a limited and focused system review   No results found for this or any previous visit (from the past 72 hour(s)).   PHQ2/9: Depression screen [facility] 2/9 05/05/2019 04/18/2019 03/01/2019 02/21/2019 12/06/2018  Decreased Interest 0 0 0 0 0  Down, Depressed, Hopeless 0 0 0 0 0  PHQ - 2 Score 0 0 0 0 0  Altered sleeping 1 1 1  - -  Tired, decreased energy 0 1 1 - -  Change in appetite 0 0 0 - -  Feeling bad or failure about yourself  0 0 0 - -  Trouble concentrating 0 0 0 - -  Moving slowly or fidgety/restless 0 0 0 - -  Suicidal thoughts 0 0 0 - -  PHQ-9 Score 1 2 2  - -  Difficult doing work/chores Not difficult at all Somewhat difficult Not difficult at all - -  Some recent data might be hidden   PHQ-2/9 Result is neg  Fall Risk: Fall Risk  05/05/2019 04/18/2019 03/01/2019 02/21/2019 12/06/2018  Falls in the past year? 1 1 0 1 1  Number falls in past yr: 0 1 1 1 1   Injury with Fall? 0 0 0 0 0  Risk for fall due to : - - - History of fall(s);Impaired  balance/gait;Impaired vision -  Risk for fall due to: Comment - - - - -  Follow up - - - Falls evaluation completed -      Objective:   Vitals:   05/05/19 0917  BP: (!) 170/78  Pulse: 99  Resp: 16  Temp: 98.3 F (36.8 C)  TempSrc: Temporal  SpO2: 96%  Weight: 238 lb (108 kg)  Height: 5' 6"  (1.676 m)    Body mass index is 38.41 kg/m.  Physical Exam   NAD, masked HEENT - [place]/AT, sclera anicteric, PERRL, EOMI, conj - non-inj'ed, pharynx clear Neck - supple, adequate range of motion, no adenopathy, no JVD, carotids 2+ and = without bruits bilat Car - RRR without m/g/r Chest -with palpation over the left-sided chest wall, she noted marked discomfort, like the pain she is having. Pulm- RR and effort normal at rest, CTA without wheeze or rales Abd - soft, minimal epigastric tenderness with palpation noted, not marked, nontender elsewhere with no rebound or guarding, ND, BS+,  no masses, no obvious HSM Back - no focal CVA tenderness, increased tenderness to palpation in the trap muscle complexes bilaterally and extending down the back in the scapular regions towards the CVA regions and lower back musculature.  Tender palpating surrounding the left shoulder Skin- no rash noted on exposed areas, Ext - no LE edema,  With attempts to forward elevate or abduct her left shoulder, she noted marked discomfort, and could get to 90 degrees but not further.  Her grip was slightly weaker on the left than the right, and she noted pain just trying to grip. Neuro/psychiatric - affect was mildly flat, appropriate with conversation  Alert and oriented  She could get up from the chair and get onto the exam table including the one step up, uses a cane walker to help her ambulate.  She could balance standing with her eyes closed.     Results for orders placed or performed during the hospital encounter of 04/27/19  Respiratory Panel by RT PCR (Flu A&B, Covid) - Nasopharyngeal Swab   Specimen:  Nasopharyngeal Swab  Result Value Ref Range   SARS Coronavirus 2 by RT PCR NEGATIVE NEGATIVE   Influenza A by PCR NEGATIVE NEGATIVE   Influenza B by PCR NEGATIVE NEGATIVE  Basic metabolic panel  Result Value Ref Range   Sodium 140 135 - 145 mmol/L   Potassium 4.0 3.5 - 5.1 mmol/L   Chloride 104 98 - 111 mmol/L   CO2 28 22 - 32 mmol/L   Glucose, Bld 197 (H) 70 - 99 mg/dL   BUN 13 6 - 20 mg/dL   Creatinine, Ser 0.94 0.44 - 1.00 mg/dL   Calcium 9.1 8.9 - 10.3 mg/dL   GFR calc  non Af Amer >60 >60 mL/min   GFR calc Af Amer >60 >60 mL/min   Anion gap 8 5 - 15  CBC  Result Value Ref Range   WBC 5.1 4.0 - 10.5 K/uL   RBC 4.09 3.87 - 5.11 MIL/uL   Hemoglobin 12.2 12.0 - 15.0 g/dL   HCT 39.0 36.0 - 46.0 %   MCV 95.4 80.0 - 100.0 fL   MCH 29.8 26.0 - 34.0 pg   MCHC 31.3 30.0 - 36.0 g/dL   RDW 13.8 11.5 - 15.5 %   Platelets 242 150 - 400 K/uL   nRBC 0.0 0.0 - 0.2 %  Protime-INR  Result Value Ref Range   Prothrombin Time 13.0 11.4 - 15.2 seconds   INR 1.0 0.8 - 1.2  APTT  Result Value Ref Range   aPTT 27 24 - 36 seconds  Urine Drug Screen, Qualitative ([facility] only)  Result Value Ref Range   Tricyclic, Ur Screen NONE DETECTED NONE DETECTED   Amphetamines, Ur Screen NONE DETECTED NONE DETECTED   MDMA (Ecstasy)Ur Screen NONE DETECTED NONE DETECTED   Cocaine Metabolite,Ur [place] NONE DETECTED NONE DETECTED   Opiate, Ur Screen NONE DETECTED NONE DETECTED   Phencyclidine (PCP) Ur S NONE DETECTED NONE DETECTED   Cannabinoid 50 Ng, Ur [place] NONE DETECTED NONE DETECTED   Barbiturates, Ur Screen NONE DETECTED NONE DETECTED   Benzodiazepine, Ur Scrn NONE DETECTED NONE DETECTED   Methadone Scn, Ur NONE DETECTED NONE DETECTED  Glucose, capillary  Result Value Ref Range   Glucose-Capillary 129 (H) 70 - 99 mg/dL  HIV Antibody (routine testing w rflx)  Result Value Ref Range   HIV Screen 4th Generation wRfx NON REACTIVE NON REACTIVE  Heparin level (unfractionated)  Result Value Ref Range   Heparin  Unfractionated 0.25 (L) 0.30 - 0.70 IU/mL  CBC  Result Value Ref Range   WBC 7.7 4.0 - 10.5 K/uL   RBC 4.33 3.87 - 5.11 MIL/uL   Hemoglobin 12.9 12.0 - 15.0 g/dL   HCT 40.2 36.0 - 46.0 %   MCV 92.8 80.0 - 100.0 fL   MCH 29.8 26.0 - 34.0 pg   MCHC 32.1 30.0 - 36.0 g/dL   RDW 14.2 11.5 - 15.5 %   Platelets 271 150 - 400 K/uL   nRBC 0.0 0.0 - 0.2 %  Hemoglobin A1c  Result Value Ref Range   Hgb A1c MFr Bld 8.9 (H) 4.8 - 5.6 %   Mean Plasma Glucose 209 mg/dL  Lipid panel  Result Value Ref Range   Cholesterol 155 0 - 200 mg/dL   Triglycerides 102 <150 mg/dL   HDL 43 >40 mg/dL   Total CHOL/HDL Ratio 3.6 RATIO   VLDL 20 0 - 40 mg/dL   LDL Cholesterol 92 0 - 99 mg/dL  Heparin level (unfractionated)  Result Value Ref Range   Heparin Unfractionated 0.20 (L) 0.30 - 0.70 IU/mL  Glucose, capillary  Result Value Ref Range   Glucose-Capillary 173 (H) 70 - 99 mg/dL  Glucose, capillary  Result Value Ref Range   Glucose-Capillary 192 (H) 70 - 99 mg/dL  Heparin level (unfractionated)  Result Value Ref Range   Heparin Unfractionated <0.10 (L) 0.30 - 0.70 IU/mL  Glucose, capillary  Result Value Ref Range   Glucose-Capillary 169 (H) 70 - 99 mg/dL  Glucose, capillary  Result Value Ref Range   Glucose-Capillary 151 (H) 70 - 99 mg/dL  Glucose, capillary  Result Value Ref Range   Glucose-Capillary 169 (H) 70 -  99 mg/dL  Basic metabolic panel  Result Value Ref Range   Sodium 140 135 - 145 mmol/L   Potassium 4.0 3.5 - 5.1 mmol/L   Chloride 108 98 - 111 mmol/L   CO2 24 22 - 32 mmol/L   Glucose, Bld 173 (H) 70 - 99 mg/dL   BUN 20 6 - 20 mg/dL   Creatinine, Ser 0.91 0.44 - 1.00 mg/dL   Calcium 8.9 8.9 - 10.3 mg/dL   GFR calc non Af Amer >60 >60 mL/min   GFR calc Af Amer >60 >60 mL/min   Anion gap 8 5 - 15  Glucose, capillary  Result Value Ref Range   Glucose-Capillary 143 (H) 70 - 99 mg/dL   Comment 1 Notify RN    Comment 2 Document in Chart   Glucose, capillary  Result Value Ref  Range   Glucose-Capillary 164 (H) 70 - 99 mg/dL   Comment 1 Notify RN    Comment 2 Document in Chart   Glucose, capillary  Result Value Ref Range   Glucose-Capillary 161 (H) 70 - 99 mg/dL   Comment 1 Notify RN    Comment 2 Document in Chart   Glucose, capillary  Result Value Ref Range   Glucose-Capillary 123 (H) 70 - 99 mg/dL  Glucose, capillary  Result Value Ref Range   Glucose-Capillary 116 (H) 70 - 99 mg/dL  Troponin I (High Sensitivity)  Result Value Ref Range   Troponin I (High Sensitivity) 10 <18 ng/L  Troponin I (High Sensitivity)  Result Value Ref Range   Troponin I (High Sensitivity) 7 <18 ng/L  Troponin I (High Sensitivity)  Result Value Ref Range   Troponin I (High Sensitivity) 9 <18 ng/L  Troponin I (High Sensitivity)  Result Value Ref Range   Troponin I (High Sensitivity) 8 <18 ng/L       Assessment & Plan:   1. Other chest pain Patient's chest pains continue after the hospitalization, and really is pain not only in the chest but in the shoulders, back, I do feel more myalgia/musculoskeletal based, with significant increase with palpation over the left chest wall.  Not like her typical reflux discomfort, and do not feel that is the source. Reassured she has had a recent catheterization which was unremarkable. She does see pain management. Also has a history of fibromyalgia. We will increase her Cymbalta to 40 mg in the morning and 20 mg in the night. Continue her Neurontin and follow-up with pain management as well. Also has had neurology involved to help in the past, related to her polyneuropathy diagnosis.  Continued involvement with their input also needed. She does have a follow-up with cardiology later this month as well.  Await their further input.  2. Bilateral thoracic back pain, unspecified chronicity As noted above, do feel more myalgia based  3. Diabetic polyneuropathy associated with type 2 diabetes mellitus ([place]) She has an appointment with  endocrine for her diabetes later this month, and emphasized the importance of keeping this appointment. Based on her current readings with checking her glucoses at home, we will continue her current regimen. Await further endocrine input.   4. Myalgia As noted above, more diffuse myalgias problematic presently. - DULoxetine (CYMBALTA) 20 MG capsule; Take 2 capsules q am and 1 capsule q pm  Dispense: 90 capsule; Refill: 2  5. Essential hypertension We will increase the Coreg to 12.5 mg twice daily, and continue her other medications to help with blood pressure. Do feel the increased pain is  contributing to her elevated blood pressures as well. Keep her follow-up with cardiology and await their further input. - carvedilol (COREG) 12.5 MG tablet; Take 1 tablet (12.5 mg total) by mouth 2 (two) times daily after a meal.  Dispense: 60 tablet; Refill: 2  6. Encounter for examination following treatment at hospital Spent time reviewing the hospital records from her recent hospitalization and abbreviated discharge summary. 7. Chronic obstructive pulmonary disease, unspecified COPD type ([place]) Continue her current regimen.  8. CVA, old, hemiparesis ([place]) As noted above, her chronic left-sided weakness is complicating the above. Continue the Plavix. Continue follow-up with neurology as well.  9. Seizure disorder ([place]) Continue follow-up with neurology and on her antiseizure medication  10. Mixed hyperlipidemia Continue the statin and Zetia products.  11. Chronic pain syndrome Discussed if the pain is not improving, contacting the pain management people and trying to get a follow-up sooner than their plan follow-up.  Await her response.  12. Morbid obesity ([place])   13. Gastroesophageal reflux disease without esophagitis Continue the PPI.  As noted above, do not feel this is the source of her more recent complaints.  Emphasized the importance of continued follow-up with her specialists, and  follow-up here sooner than planned follow-up as needed.       Towanda Malkin, MD 05/05/19 9:37 AM

## 2019-05-05 ENCOUNTER — Other Ambulatory Visit: Payer: Self-pay

## 2019-05-05 ENCOUNTER — Encounter: Payer: Self-pay | Admitting: Internal Medicine

## 2019-05-05 ENCOUNTER — Ambulatory Visit (INDEPENDENT_AMBULATORY_CARE_PROVIDER_SITE_OTHER): Payer: Medicare HMO | Admitting: Internal Medicine

## 2019-05-05 VITALS — BP 170/78 | HR 99 | Temp 98.3°F | Resp 16 | Ht 66.0 in | Wt 238.0 lb

## 2019-05-05 DIAGNOSIS — R0789 Other chest pain: Secondary | ICD-10-CM | POA: Diagnosis not present

## 2019-05-05 DIAGNOSIS — M546 Pain in thoracic spine: Secondary | ICD-10-CM

## 2019-05-05 DIAGNOSIS — E1142 Type 2 diabetes mellitus with diabetic polyneuropathy: Secondary | ICD-10-CM

## 2019-05-05 DIAGNOSIS — I69359 Hemiplegia and hemiparesis following cerebral infarction affecting unspecified side: Secondary | ICD-10-CM | POA: Diagnosis not present

## 2019-05-05 DIAGNOSIS — Z09 Encounter for follow-up examination after completed treatment for conditions other than malignant neoplasm: Secondary | ICD-10-CM

## 2019-05-05 DIAGNOSIS — J449 Chronic obstructive pulmonary disease, unspecified: Secondary | ICD-10-CM

## 2019-05-05 DIAGNOSIS — G40909 Epilepsy, unspecified, not intractable, without status epilepticus: Secondary | ICD-10-CM

## 2019-05-05 DIAGNOSIS — K219 Gastro-esophageal reflux disease without esophagitis: Secondary | ICD-10-CM

## 2019-05-05 DIAGNOSIS — M791 Myalgia, unspecified site: Secondary | ICD-10-CM | POA: Diagnosis not present

## 2019-05-05 DIAGNOSIS — E782 Mixed hyperlipidemia: Secondary | ICD-10-CM

## 2019-05-05 DIAGNOSIS — I10 Essential (primary) hypertension: Secondary | ICD-10-CM | POA: Diagnosis not present

## 2019-05-05 DIAGNOSIS — G894 Chronic pain syndrome: Secondary | ICD-10-CM

## 2019-05-05 MED ORDER — DULOXETINE HCL 20 MG PO CPEP: ORAL_CAPSULE | ORAL | 2 refills | Status: DC

## 2019-05-05 MED ORDER — CARVEDILOL 12.5 MG PO TABS: 12.5000 mg | ORAL_TABLET | Freq: Two times a day (BID) | ORAL | 2 refills | Status: DC

## 2019-05-05 NOTE — Patient Instructions (Addendum)
Increase the duloxetine (Cymbalta) to 2 capsules in the morning, and 1 capsule in the evening.  The carvedilol (Coreg) dose should increase to 12.5 mg twice daily.  You can take 2 of your tablets twice daily presently until they run out, then the refill at the pharmacy will be for 12.5 mg tablets, and only take 1 twice daily of that prescription.  Keep your follow-ups with cardiology and endocrinology later this month also is very important.

## 2019-05-09 ENCOUNTER — Telehealth: Payer: Self-pay | Admitting: Family Medicine

## 2019-05-09 NOTE — Telephone Encounter (Signed)
Copied from [place] [id number]. Topic: General - Other >> May 09, 2019  8:07 AM Keene Breath wrote: Reason for CRM: Patient is calling to find out the status of her FMLA papers.  Please call back to give an update.  CB# [phone].  If there is a VM, please leave a detailed msg. On how the patient can get the papers.

## 2019-05-10 DIAGNOSIS — J449 Chronic obstructive pulmonary disease, unspecified: Secondary | ICD-10-CM | POA: Diagnosis not present

## 2019-05-10 NOTE — Telephone Encounter (Signed)
Pt calling again and is requesting to have an update on her fmla papers. Please advise.

## 2019-05-10 NOTE — Telephone Encounter (Signed)
I spoke with the patient and informed her that the paperwork is being filled out and should be completed by end of today or early tomorrow morning. Patient would like me to fax the paperwork and to inform her daughter at [phone] when it's complete.

## 2019-05-11 DIAGNOSIS — E782 Mixed hyperlipidemia: Secondary | ICD-10-CM | POA: Diagnosis not present

## 2019-05-11 DIAGNOSIS — I10 Essential (primary) hypertension: Secondary | ICD-10-CM | POA: Diagnosis not present

## 2019-05-11 DIAGNOSIS — Z87898 Personal history of other specified conditions: Secondary | ICD-10-CM | POA: Diagnosis not present

## 2019-05-11 DIAGNOSIS — R002 Palpitations: Secondary | ICD-10-CM | POA: Diagnosis not present

## 2019-05-11 DIAGNOSIS — E669 Obesity, unspecified: Secondary | ICD-10-CM | POA: Diagnosis not present

## 2019-05-11 DIAGNOSIS — G4733 Obstructive sleep apnea (adult) (pediatric): Secondary | ICD-10-CM | POA: Diagnosis not present

## 2019-05-11 DIAGNOSIS — E1165 Type 2 diabetes mellitus with hyperglycemia: Secondary | ICD-10-CM | POA: Diagnosis not present

## 2019-05-11 DIAGNOSIS — R0602 Shortness of breath: Secondary | ICD-10-CM | POA: Diagnosis not present

## 2019-05-11 DIAGNOSIS — Z8673 Personal history of transient ischemic attack (TIA), and cerebral infarction without residual deficits: Secondary | ICD-10-CM | POA: Diagnosis not present

## 2019-05-14 ENCOUNTER — Encounter: Payer: Self-pay | Admitting: Internal Medicine

## 2019-05-15 ENCOUNTER — Emergency Department: Payer: Medicare HMO

## 2019-05-15 ENCOUNTER — Other Ambulatory Visit: Payer: Self-pay

## 2019-05-15 ENCOUNTER — Emergency Department
Admission: EM | Admit: 2019-05-15 | Discharge: 2019-05-16 | Disposition: A | Discharge status: Home / Self Care | Payer: Medicare HMO | Source: Home / Self Care | Attending: Emergency Medicine | Admitting: Emergency Medicine

## 2019-05-15 ENCOUNTER — Encounter: Payer: Self-pay | Admitting: *Deleted

## 2019-05-15 DIAGNOSIS — Z7902 Long term (current) use of antithrombotics/antiplatelets: Secondary | ICD-10-CM | POA: Diagnosis not present

## 2019-05-15 DIAGNOSIS — I10 Essential (primary) hypertension: Secondary | ICD-10-CM | POA: Diagnosis not present

## 2019-05-15 DIAGNOSIS — I2510 Atherosclerotic heart disease of native coronary artery without angina pectoris: Secondary | ICD-10-CM | POA: Diagnosis present

## 2019-05-15 DIAGNOSIS — E739 Lactose intolerance, unspecified: Secondary | ICD-10-CM | POA: Diagnosis present

## 2019-05-15 DIAGNOSIS — K219 Gastro-esophageal reflux disease without esophagitis: Secondary | ICD-10-CM | POA: Diagnosis present

## 2019-05-15 DIAGNOSIS — E162 Hypoglycemia, unspecified: Secondary | ICD-10-CM | POA: Diagnosis not present

## 2019-05-15 DIAGNOSIS — G43909 Migraine, unspecified, not intractable, without status migrainosus: Secondary | ICD-10-CM | POA: Diagnosis present

## 2019-05-15 DIAGNOSIS — Z03818 Encounter for observation for suspected exposure to other biological agents ruled out: Secondary | ICD-10-CM | POA: Diagnosis not present

## 2019-05-15 DIAGNOSIS — R0902 Hypoxemia: Secondary | ICD-10-CM | POA: Diagnosis not present

## 2019-05-15 DIAGNOSIS — Z20822 Contact with and (suspected) exposure to covid-19: Secondary | ICD-10-CM | POA: Diagnosis not present

## 2019-05-15 DIAGNOSIS — I69354 Hemiplegia and hemiparesis following cerebral infarction affecting left non-dominant side: Secondary | ICD-10-CM | POA: Diagnosis not present

## 2019-05-15 DIAGNOSIS — E785 Hyperlipidemia, unspecified: Secondary | ICD-10-CM | POA: Diagnosis present

## 2019-05-15 DIAGNOSIS — G4733 Obstructive sleep apnea (adult) (pediatric): Secondary | ICD-10-CM | POA: Diagnosis present

## 2019-05-15 DIAGNOSIS — Z79899 Other long term (current) drug therapy: Secondary | ICD-10-CM | POA: Insufficient documentation

## 2019-05-15 DIAGNOSIS — T383X5A Adverse effect of insulin and oral hypoglycemic [antidiabetic] drugs, initial encounter: Secondary | ICD-10-CM | POA: Diagnosis not present

## 2019-05-15 DIAGNOSIS — I110 Hypertensive heart disease with heart failure: Secondary | ICD-10-CM | POA: Diagnosis present

## 2019-05-15 DIAGNOSIS — I5032 Chronic diastolic (congestive) heart failure: Secondary | ICD-10-CM | POA: Diagnosis not present

## 2019-05-15 DIAGNOSIS — E11649 Type 2 diabetes mellitus with hypoglycemia without coma: Secondary | ICD-10-CM | POA: Insufficient documentation

## 2019-05-15 DIAGNOSIS — R4020 Unspecified coma: Secondary | ICD-10-CM | POA: Diagnosis not present

## 2019-05-15 DIAGNOSIS — Z794 Long term (current) use of insulin: Secondary | ICD-10-CM | POA: Diagnosis not present

## 2019-05-15 DIAGNOSIS — R410 Disorientation, unspecified: Secondary | ICD-10-CM | POA: Diagnosis not present

## 2019-05-15 DIAGNOSIS — I252 Old myocardial infarction: Secondary | ICD-10-CM | POA: Diagnosis not present

## 2019-05-15 DIAGNOSIS — M1990 Unspecified osteoarthritis, unspecified site: Secondary | ICD-10-CM | POA: Diagnosis not present

## 2019-05-15 DIAGNOSIS — H409 Unspecified glaucoma: Secondary | ICD-10-CM | POA: Diagnosis present

## 2019-05-15 DIAGNOSIS — E161 Other hypoglycemia: Secondary | ICD-10-CM | POA: Diagnosis not present

## 2019-05-15 DIAGNOSIS — G40A09 Absence epileptic syndrome, not intractable, without status epilepticus: Secondary | ICD-10-CM | POA: Diagnosis present

## 2019-05-15 DIAGNOSIS — R4182 Altered mental status, unspecified: Secondary | ICD-10-CM | POA: Diagnosis not present

## 2019-05-15 DIAGNOSIS — G890 Central pain syndrome: Secondary | ICD-10-CM | POA: Diagnosis present

## 2019-05-15 DIAGNOSIS — M79602 Pain in left arm: Secondary | ICD-10-CM | POA: Insufficient documentation

## 2019-05-15 DIAGNOSIS — J449 Chronic obstructive pulmonary disease, unspecified: Secondary | ICD-10-CM | POA: Insufficient documentation

## 2019-05-15 DIAGNOSIS — E11319 Type 2 diabetes mellitus with unspecified diabetic retinopathy without macular edema: Secondary | ICD-10-CM | POA: Diagnosis not present

## 2019-05-15 DIAGNOSIS — E1140 Type 2 diabetes mellitus with diabetic neuropathy, unspecified: Secondary | ICD-10-CM | POA: Diagnosis not present

## 2019-05-15 DIAGNOSIS — R61 Generalized hyperhidrosis: Secondary | ICD-10-CM | POA: Diagnosis not present

## 2019-05-15 DIAGNOSIS — Z87891 Personal history of nicotine dependence: Secondary | ICD-10-CM | POA: Insufficient documentation

## 2019-05-15 DIAGNOSIS — Z6838 Body mass index (BMI) 38.0-38.9, adult: Secondary | ICD-10-CM | POA: Diagnosis not present

## 2019-05-15 DIAGNOSIS — H548 Legal blindness, as defined in USA: Secondary | ICD-10-CM | POA: Diagnosis present

## 2019-05-15 DIAGNOSIS — E1165 Type 2 diabetes mellitus with hyperglycemia: Secondary | ICD-10-CM | POA: Diagnosis not present

## 2019-05-15 LAB — CBC
HCT: 42.6 % (ref 36.0–46.0)
Hemoglobin: 13.2 g/dL (ref 12.0–15.0)
MCH: 29.6 pg (ref 26.0–34.0)
MCHC: 31 g/dL (ref 30.0–36.0)
MCV: 95.5 fL (ref 80.0–100.0)
Platelets: 304 10*3/uL (ref 150–400)
RBC: 4.46 MIL/uL (ref 3.87–5.11)
RDW: 13.6 % (ref 11.5–15.5)
WBC: 6.4 10*3/uL (ref 4.0–10.5)
nRBC: 0 % (ref 0.0–0.2)

## 2019-05-15 LAB — BASIC METABOLIC PANEL
Anion gap: 6 (ref 5–15)
BUN: 21 mg/dL — ABNORMAL HIGH (ref 6–20)
CO2: 26 mmol/L (ref 22–32)
Calcium: 9 mg/dL (ref 8.9–10.3)
Chloride: 108 mmol/L (ref 98–111)
Creatinine, Ser: 0.95 mg/dL (ref 0.44–1.00)
GFR calc Af Amer: >60 mL/min (ref >60)
GFR calc non Af Amer: >60 mL/min (ref >60)
Glucose, Bld: 248 mg/dL — ABNORMAL HIGH (ref 70–99)
Potassium: 3.9 mmol/L (ref 3.5–5.1)
Sodium: 140 mmol/L (ref 135–145)

## 2019-05-15 LAB — TROPONIN I (HIGH SENSITIVITY)
Troponin I (High Sensitivity): 5 ng/L (ref <18)
Troponin I (High Sensitivity): 4 ng/L (ref <18)

## 2019-05-15 LAB — GLUCOSE, CAPILLARY
Glucose-Capillary: 201 mg/dL — ABNORMAL HIGH (ref 70–99)
Glucose-Capillary: 213 mg/dL — ABNORMAL HIGH (ref 70–99)

## 2019-05-15 MED ORDER — OXYCODONE-ACETAMINOPHEN 5-325 MG PO TABS
1.0000 | ORAL_TABLET | Freq: Once | ORAL | Status: AC
  Administered 2019-05-15: 1 via ORAL
  Filled 2019-05-15: qty 1

## 2019-05-15 MED ORDER — SODIUM CHLORIDE 0.9 % IV BOLUS
500.0000 mL | Freq: Once | INTRAVENOUS | Status: AC
  Administered 2019-05-15: 22:00:00 500 mL via INTRAVENOUS

## 2019-05-15 MED ORDER — CARVEDILOL 6.25 MG PO TABS
12.5000 mg | ORAL_TABLET | Freq: Once | ORAL | Status: AC
  Administered 2019-05-15: 12.5 mg via ORAL
  Filled 2019-05-15: qty 2

## 2019-05-15 NOTE — ED Notes (Signed)
Pt brought in via ems from home with fsbs 26 at home.   On arrival to er, fsbs 201.  Iv in place.  Pt had glucagon and ate a sandwich.  Hx cva, left side deficit.  Pt alert on arrival.  Skin warm and dry.  No n/v/d. Labs sent ekd done.

## 2019-05-15 NOTE — ED Notes (Signed)
Pt alert.  Speech clear.  Pain meds given for left arm pain.  nsr on monitor.

## 2019-05-15 NOTE — ED Provider Notes (Signed)
[facility] Emergency Department Provider Note ____________________________________________   First MD Initiated Contact with Patient 05/15/19 1741     (approximate)  I have reviewed the triage vital signs and the nursing notes.   HISTORY  Chief Complaint Hypoglycemia    HPI [name] is a 54 y.o. female with PMH as noted below including diabetes on insulin who presents with altered mental status and hypoglycemia.  The patient does not remember what happened.  She states she woke up this morning, and then after that the next thing she remembers is EMS being at her house.  She was found to be diaphoretic with a blood sugar of 26.  She received glucagon and had some food before coming to the ED.  At this time, the patient complains of pain in the left shoulder and arm but denies other acute symptoms.  She states she has been taking her insulin, and does not know of any change in her routine today.  Past Medical History:  Diagnosis Date  . Anxiety and depression 09/13/2006   Qualifier: Diagnosis of  By: Hassell Done [name], Tori Milks    . Arthritis    joint pain   . Asthma   . COPD (chronic obstructive pulmonary disease) (HCC)    chronic bronchitis   . Depression   . Diabetes mellitus    since age 71; type 2 IDDM  . Diabetic neuropathy, painful ([place])    FEET AND HANDS  . Diabetic retinopathy ([place])   . Diastolic dysfunction    a.  Echo 11/17: EF 60-65%, mild LVH, no RWMA, Gr1DD, mild AI, dilated aortic root measuring 38 mm, mildly dilated ascending aorta  . Dilated aortic root ([place])    28m by echo 11/2015  . Dizziness    secondary to diabetes and hypertension   . Epilepsy idiopathic petit mal ([place]    last seizure 2012;controlled w/ topomax  . GERD (gastroesophageal reflux disease)   . Glaucoma    NOT ON ANY EYE DROPS   . Headache(784.0)    migraines   . Heart murmur    born with   . History of stress test    a. 11/17: Normal perfusion, EF  53%, normal study  . Hx of blood clots    hematomas removed from left side of brain from [id number] [id number]   . Hyperlipidemia   . Hypertension   . Legally blind    left eye   . MIGRAINE HEADACHE 09/14/2006   Qualifier: Diagnosis of  By: MarHassell DoneP, NykTori Milks . Myocardial infarction (HCCCarl  a.  Patient reported history of without objective documentation  . Nephrolithiasis    frequent urination , urination at nite  PT SEEN IN ER 09/22/11 FOR BACK AND RT SIDED PAIN--HAS KNOWN STONE RT URETER AND UA IN ER SHOWED UTI  . Pain 09/23/11   LOWER BACK AND RIGHT SIDE--PT HAS RIGHT URETERAL STONE  . Pneumonia    hx of 2009  . Pyelonephritis   . Seizures ([place] . Sleep ap[facility]    [facility] ; mild  . Stress incontinence   . [facility]  [facility]  RESIDUAL LEFT LEG WEAKNESS--NO OTHER RESIDUAL PROBLEMS    Patient Active Problem List   Diagnosis Date Noted  . Chest pain 04/27/2019  . Type 2 diabetes mellitus without complication, with long-term current use of insulin (HCCJackson4/08/2019  . COPD (chronic obstructive  pulmonary disease) ([place]) 04/27/2019  . CAD (coronary artery disease) 04/27/2019  . H/O: stroke with residual effects   . Myalgia 04/19/2019  . 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 Non-intractable vomiting 03/01/2019  . History of CVA (cerebrovascular accident) 07/08/2018  . History of seizure 07/08/2018  . Diabetic polyneuropathy associated with type 2 diabetes mellitus ([place]) 03/02/2018  . Obesity (BMI 30-39.9) 03/02/2018  . Vitamin D deficiency 01/24/2018  . Vulvovaginal candidiasis 01/24/2018  . Hypokalemia 06/08/2017  . Overactive bladder 05/18/2017  . Asthma 06/22/2016  . Dilated aortic root ([place])   . Sacroiliac dysfunction 10/09/2014  . Chronic low back pain 09/03/2014  . CVA, old, hemiparesis ([place]) 09/03/2014  . OSA (obstructive sleep apnea) 05/15/2014  . Restrictive airway disease 05/15/2014  . Colitis 02/22/2014  . Type 2  diabetes, uncontrolled, with renal manifestation ([place]) 01/21/2014  . Chronic pain syndrome 01/21/2014  . Headache disorder 09/07/2013  . Diabetic neuropathy ([place]) 05/23/2013  . Mixed urge and stress incontinence 08/25/2011  . Ureteral stricture, right 08/25/2011  . Pyelonephritis 08/22/2011  . Nephrolithiasis 08/22/2011  . Incisional hernia 05/10/2011  . Morbid obesity ([place]) 05/10/2011  . Tachycardia 05/08/2011  . BLINDNESS, [place] DEFINITION 09/14/2006  . Hyperlipemia 09/13/2006  . Essential hypertension 09/13/2006  . MYOCARDIAL INFARCTION, HX OF 09/13/2006  . GERD 09/13/2006  . Seizure disorder ([place]) 09/13/2006    Past Surgical History:  Procedure Laterality Date  . BRAIN HEMATOMA EVACUATION     five procedures total, first procedure when 81 months old, last at 54 years of age.  . CYSTOSCOPY W/ RETROGRADES  09/24/2011   Procedure: CYSTOSCOPY WITH RETROGRADE PYELOGRAM;  Surgeon: Malka So, MD;  Location: [facility];  Service: Urology;  Laterality: Right;  . CYSTOSCOPY WITH URETEROSCOPY  09/24/2011   Procedure: CYSTOSCOPY WITH URETEROSCOPY;  Surgeon: Malka So, MD;  Location: [facility];  Service: Urology;  Laterality: Right;  Balloon dilation right ureter   . CYSTOSCOPY/RETROGRADE/URETEROSCOPY  08/24/2011   Procedure: CYSTOSCOPY/RETROGRADE/URETEROSCOPY;  Surgeon: Molli Hazard, MD;  Location: [facility];  Service: Urology;  Laterality: Right;  Cysto, Right retrograde Pyelogram, right stent placement.   Fatima Blank HERNIA REPAIR  05/05/2011   Procedure: LAPAROSCOPIC INCISIONAL HERNIA;  Surgeon: Rolm Bookbinder, MD;  Location: [facility];  Service: General;  Laterality: N/A;  . kidney stone removal    . MASS EXCISION  05/05/2011   Procedure: EXCISION MASS;  Surgeon: Rolm Bookbinder, MD;  Location: [facility];  Service: General;  Laterality: Right;  . PCNL    . RETINAL DETACHMENT SURGERY Left 1990  . RIGHT/LEFT HEART CATH AND CORONARY ANGIOGRAPHY N/A 04/28/2019   Procedure: RIGHT/LEFT HEART  CATH AND CORONARY ANGIOGRAPHY;  Surgeon: Yolonda Kida, MD;  Location: [facility];  Service: Cardiovascular;  Laterality: N/A;  . SHUNT REMOVAL     shunt inserted at age 60 removed at age 43   . VAGINAL HYSTERECTOMY  1996    Prior to Admission medications   Medication Sig Start Date End Date Taking? Authorizing Provider  albuterol (ACCUNEB) 1.25 MG/3ML nebulizer solution Inhale into the lungs. Use 3 mls by nebulization every 6 hours as needed for wheezing 01/26/19 01/26/20  [provider]  albuterol (PROAIR HFA) 108 (90 Base) MCG/ACT inhaler INHALE 2 PUFFS BY MOUTH EVERY 6 HOURS AS NEEDED FOR WHEEZING OR SHORTNESS OF BREATH 01/24/18   Hubbard Hartshorn, [name]  AMBULATORY NON FORMULARY MEDICATION Medication Name: Muscle and Joint Vanishing Scent Gel- Apply as needed to skin  [provider]  budesonide (PULMICORT) 0.5 MG/2ML nebulizer solution Inhale 0.5 mg into the lungs daily. Take 2 mls by nebulizations once daily  01/26/19 01/26/20  [provider]  Calcium Carbonate-Vitamin D (CALCIUM-VITAMIN D3 PO) D3 500- calcium 630 mg Take 1 capsule by mouth once daily    [provider]  carvedilol (COREG) 12.5 MG tablet Take 1 tablet (12.5 mg total) by mouth 2 (two) times daily after a meal. 05/05/19 06/04/19  Towanda Malkin, MD  clopidogrel (PLAVIX) 75 MG tablet Take 1 tablet (75 mg total) by mouth daily. 10/07/18   Steele Sizer, MD  cycloSPORINE (RESTASIS) 0.05 % ophthalmic emulsion Place 2 drops into both eyes 2 (two) times daily.    [provider]  Dulaglutide 1.5 MG/0.5ML SOPN Inject 1.5 mg into the skin once a week. 10/20/18   Hubbard Hartshorn, [name]  DULoxetine (CYMBALTA) 20 MG capsule Take 2 capsules q am and 1 capsule q pm 05/05/19   Towanda Malkin, MD  DUPIXENT 300 MG/2ML SOPN Inject 300 mg into the skin every 14 (fourteen) days.  04/04/19   [provider]  EPINEPHrine 0.3 mg/0.3 mL IJ SOAJ injection Inject 0.3 mg into the  muscle as needed for anaphylaxis.  03/29/19   [provider]  ezetimibe (ZETIA) 10 MG tablet Take 1 tablet (10 mg total) by mouth daily. 04/18/19   Towanda Malkin, MD  fluticasone (FLONASE) 50 MCG/ACT nasal spray Place 2 sprays into both nostrils daily. 04/18/19   Towanda Malkin, MD  Fluticasone-Salmeterol (WIXELA INHUB) 100-50 MCG/DOSE AEPB Inhale 1 puff into the lungs 2 (two) times daily.    [provider]  gabapentin (NEURONTIN) 400 MG capsule 400 mg qAM, 400 mg qPM, 800 mg qhs 04/19/19   Gillis Santa, MD  insulin aspart (NOVOLOG FLEXPEN) 100 UNIT/ML FlexPen Inject 18-50 Units into the skin 3 (three) times daily with meals.  06/08/18 06/08/19  [provider]  Insulin Glargine, 2 Unit Dial, 300 UNIT/ML SOPN Inject 120 Units into the skin daily. Patient taking differently: Inject 136 Units into the skin daily.  03/30/18   Hubbard Hartshorn, [name]  levETIRAcetam (KEPPRA) 500 MG tablet Take 500 mg by mouth 2 (two) times daily. 1 TAB IN THE MORNING AND 1.5 TABS AT NIGHT 07/08/18   [provider]  losartan (COZAAR) 50 MG tablet Take 50 mg by mouth daily.    [provider]  mirabegron ER (MYRBETRIQ) 50 MG TB24 tablet Take 1 tablet (50 mg total) by mouth daily. 04/18/19   Towanda Malkin, MD  nitroGLYCERIN (NITROSTAT) 0.4 MG SL tablet Place 1 tablet (0.4 mg total) under the tongue every 5 (five) minutes as needed for chest pain. Patient not taking: Reported on 05/05/2019 04/29/19 05/29/19  Sidney Ace, MD  ondansetron (ZOFRAN ODT) 4 MG disintegrating tablet Take 1 tablet (4 mg total) by mouth every 8 (eight) hours as needed for nausea or vomiting. 04/18/19   Towanda Malkin, MD  oxyCODONE-acetaminophen (PERCOCET) 5-325 MG tablet Take 1 tablet by mouth every 12 (twelve) hours as needed for severe pain. 04/19/19 05/19/19  Gillis Santa, MD  pantoprazole (PROTONIX) 40 MG tablet Take 1 tablet (40 mg total) by mouth daily. 07/21/18   Hubbard Hartshorn, [name]  polyethylene glycol (MIRALAX / GLYCOLAX) packet Take 17 g by mouth daily as needed for mild constipation. 07/30/16   Lauree Chandler, NP  potassium chloride SA (KLOR-CON M20) 20 MEQ tablet Take 2 tablets (40 mEq  total) by mouth daily. 03/01/19   Towanda Malkin, MD  predniSONE (DELTASONE) 20 MG tablet Take 20 mg by mouth daily.  01/26/19   [provider]  simvastatin (ZOCOR) 40 MG tablet Take 40 mg by mouth daily.    [provider]  spironolactone (ALDACTONE) 25 MG tablet Take 1 tablet by mouth daily. 01/23/19   [provider]  terconazole (TERAZOL 7) 0.4 % vaginal cream Place 1 applicator vaginally at bedtime as needed (irritation).    [provider]    Allergies Codeine and Lactose intolerance (gi)  Family History  Problem Relation Age of Onset  . Uterine cancer Mother   . Hypertension Mother   . Cancer Mother   . Brain cancer Maternal Grandmother   . Hypertension Maternal Grandmother   . Birth defects Daughter   . Hypertension Daughter   . Cirrhosis Maternal Grandfather   . ADD / ADHD Maternal Grandfather   . Birth defects Maternal Grandfather   . Diabetes Maternal Grandfather   . Anesthesia problems Neg Hx   . Colon cancer Neg Hx   . Esophageal cancer Neg Hx   . Pancreatic cancer Neg Hx     Social History Social History   Tobacco Use  . Smoking status: Former Smoker    Packs/day: 1.00    Years: 15.00    Pack years: 15.00    Types: Cigarettes    Quit date: 01/20/1987    Years since quitting: 32.3  . Smokeless tobacco: Never Used  . Tobacco comment: quit more than 20 years  Substance Use Topics  . Alcohol use: Never    Alcohol/week: 0.0 standard drinks  . Drug use: No    Comment: hx of marijuana use no longer uses     Review of Systems  Constitutional: No fever. Eyes: No redness. ENT: No sore throat. Cardiovascular: Denies chest pain. Respiratory: Denies shortness of breath. Gastrointestinal: No  vomiting. Genitourinary: Negative for flank pain..  Musculoskeletal: Positive for left shoulder and arm pain. Skin: Negative for rash. Neurological: Negative for headache.   ____________________________________________   PHYSICAL EXAM:  VITAL SIGNS: ED Triage Vitals  Enc Vitals Group     BP 05/15/19 1726 (!) 173/65     Pulse Rate 05/15/19 1731 63     Resp 05/15/19 1731 20     Temp --      Temp src --      SpO2 05/15/19 1731 100 %     Weight 05/15/19 1731 238 lb (108 kg)     Height 05/15/19 1731 5' (1.524 m)     Head Circumference --      Peak Flow --      Pain Score 05/15/19 1731 9     Pain Loc --      Pain Edu? --      Excl. in [place]? --     Constitutional: Alert and oriented.  Tired appearing but in no acute distress. Eyes: Conjunctivae are normal.  EOMI. Head: Atraumatic. Nose: No congestion/rhinnorhea. Mouth/Throat: Mucous membranes are slightly dry.   Neck: Normal range of motion.  Cardiovascular: Normal rate, regular rhythm. Grossly normal heart sounds.  Good peripheral circulation. Respiratory: Normal respiratory effort.  No retractions. Lungs CTAB. Gastrointestinal: Soft and nontender. No distention.  Genitourinary: No CVA tenderness. Musculoskeletal: No lower extremity edema.  Extremities warm and well perfused.  Pain on range of motion of the left shoulder and palpation of the upper arm. Neurologic:  Normal speech and language.  Motor intact  in all extremities. Skin:  Skin is warm and dry. No rash noted. Psychiatric: Calm and cooperative.  ____________________________________________   LABS (all labs ordered are listed, but only abnormal results are displayed)  Labs Reviewed  GLUCOSE, CAPILLARY - Abnormal; Notable for the following components:      Result Value   Glucose-Capillary 201 (*)    All other components within normal limits  BASIC METABOLIC PANEL - Abnormal; Notable for the following components:   Glucose, Bld 248 (*)    BUN 21 (*)    All  other components within normal limits  GLUCOSE, CAPILLARY - Abnormal; Notable for the following components:   Glucose-Capillary 213 (*)    All other components within normal limits  CBC  URINALYSIS, COMPLETE (UACMP) WITH MICROSCOPIC  CBG MONITORING, ED  CBG MONITORING, ED  TROPONIN I (HIGH SENSITIVITY)  TROPONIN I (HIGH SENSITIVITY)   ____________________________________________  EKG  ED ECG REPORT I, Arta Silence, the attending physician, personally viewed and interpreted this ECG.  Date: 05/15/2019 EKG Time: 1736 Rate: 62 Rhythm: normal sinus rhythm QRS Axis: normal Intervals: normal ST/T Wave abnormalities: Early repolarization Narrative Interpretation: no evidence of acute ischemia  ____________________________________________  RADIOLOGY  CXR: No focal infiltrate or other acute abnormality XR L humerus: No acute fracture  ____________________________________________   PROCEDURES  Procedure(s) performed: No  Procedures  Critical Care performed: No ____________________________________________   INITIAL IMPRESSION / ASSESSMENT AND PLAN / ED COURSE  Pertinent labs & imaging results that were available during my care of the patient were reviewed by me and considered in my medical decision making (see chart for details).  54 year old female with PMH as noted above presents after an episode in which she lost consciousness and was found to be hypoglycemic.  Her glucose is now normal.  The patient primarily reports left upper arm pain and denies other acute symptoms.  She cannot identify any change in her routine or reason that she became hypoglycemic, although she does not remember much from today.  I reviewed the past medical records in epic.  The patient was most recently seen in the ED on 4/8 with chest pain radiating to left arm.  The patient had a cardiac catheterization with concern for unstable angina.  There was no finding of CAD.  On exam today, the  patient is tired and weak appearing however no acute distress.  She is hypertensive with otherwise normal vital signs.  Neurologic exam is nonfocal.  She does have pain on palpation of the left upper arm and movement of the left shoulder which seems musculoskeletal in nature.  It appears that the patient is on 3 times daily NovoLog as well as long-acting insulin daily.  Overall I suspect that the patient lost consciousness due to hypoglycemia, although the etiology of the hypoglycemia is unclear given that the patient cannot give much history about today.  We will obtain labs, watch his glucose for the next several hours and reassess.  I will also add on an x-ray of the left humerus to further evaluate for any injury.  ----------------------------------------- 11:08 PM on 05/15/2019 -----------------------------------------  The patient is now much more alert and appears comfortable.  She states that she feels a bit lightheaded, but denies other acute symptoms.  Her blood pressure had worsened somewhat, although the patient did not take her evening Coreg since she was here.  I have ordered a dose of Coreg and a small fluid bolus.  On reassessment she is feeling a bit better.  Given  the negative lab work-up and the patient's overall improvement, I feel she is appropriate for discharge home.  If the blood pressure is stable and the patient is feeling well, we will discharge.  I counseled the patient on the results of the work-up.  I gave her thorough return precautions, she expressed understanding.  ____________________________________________   FINAL CLINICAL IMPRESSION(S) / ED DIAGNOSES  Final diagnoses:  Hypoglycemia  Left arm pain      NEW MEDICATIONS STARTED DURING THIS VISIT:  New Prescriptions   No medications on file     Note:  This document was prepared using Dragon voice recognition software and may include unintentional dictation errors.     Arta Silence,  MD 05/15/19 2310

## 2019-05-15 NOTE — ED Notes (Signed)
fsbs 213

## 2019-05-15 NOTE — ED Triage Notes (Signed)
Pt brought in via ems from home.  Pt was diaphoretic in bed with a blood sugar of 26.  Ems gave im glucagon and pt ate before arriving to er.  On arrival, fsbs 201.  Pt alert.  Iv in place.

## 2019-05-15 NOTE — ED Notes (Signed)
Patient discharged to home per MD order. Patient in stable condition, and deemed medically cleared by ED provider for discharge. Discharge instructions reviewed with patient/family using "Teach Back"; verbalized understanding of medication education and administration, and information about follow-up care. Denies further concerns. ° °

## 2019-05-15 NOTE — Discharge Instructions (Signed)
Continue to use her insulin as prescribed, and make sure you are eating regularly throughout the day and checking your sugar often.  Follow-up with your regular doctor.  Return to the ER for new, worsening, or recurrent low blood sugar readings, weakness or lightheadedness, passing out, worsening pain, or any other new or worsening symptoms that concern you.

## 2019-05-16 ENCOUNTER — Other Ambulatory Visit: Payer: Self-pay

## 2019-05-16 ENCOUNTER — Inpatient Hospital Stay
Admission: EM | Admit: 2019-05-16 | Discharge: 2019-05-21 | DRG: 638 | Disposition: A | Discharge status: Home Health | Payer: Medicare HMO | Source: Ambulatory Visit | Attending: Hospitalist | Admitting: Hospitalist

## 2019-05-16 ENCOUNTER — Encounter: Payer: Self-pay | Admitting: Emergency Medicine

## 2019-05-16 DIAGNOSIS — E1129 Type 2 diabetes mellitus with other diabetic kidney complication: Secondary | ICD-10-CM

## 2019-05-16 DIAGNOSIS — IMO0002 Reserved for concepts with insufficient information to code with codable children: Secondary | ICD-10-CM

## 2019-05-16 DIAGNOSIS — Z6838 Body mass index (BMI) 38.0-38.9, adult: Secondary | ICD-10-CM

## 2019-05-16 DIAGNOSIS — E1140 Type 2 diabetes mellitus with diabetic neuropathy, unspecified: Secondary | ICD-10-CM | POA: Diagnosis present

## 2019-05-16 DIAGNOSIS — Z833 Family history of diabetes mellitus: Secondary | ICD-10-CM

## 2019-05-16 DIAGNOSIS — T383X5A Adverse effect of insulin and oral hypoglycemic [antidiabetic] drugs, initial encounter: Secondary | ICD-10-CM | POA: Diagnosis present

## 2019-05-16 DIAGNOSIS — G43909 Migraine, unspecified, not intractable, without status migrainosus: Secondary | ICD-10-CM | POA: Diagnosis present

## 2019-05-16 DIAGNOSIS — Z808 Family history of malignant neoplasm of other organs or systems: Secondary | ICD-10-CM

## 2019-05-16 DIAGNOSIS — Z7951 Long term (current) use of inhaled steroids: Secondary | ICD-10-CM

## 2019-05-16 DIAGNOSIS — I10 Essential (primary) hypertension: Secondary | ICD-10-CM | POA: Diagnosis present

## 2019-05-16 DIAGNOSIS — I110 Hypertensive heart disease with heart failure: Secondary | ICD-10-CM | POA: Diagnosis present

## 2019-05-16 DIAGNOSIS — H548 Legal blindness, as defined in USA: Secondary | ICD-10-CM | POA: Diagnosis present

## 2019-05-16 DIAGNOSIS — Z8249 Family history of ischemic heart disease and other diseases of the circulatory system: Secondary | ICD-10-CM

## 2019-05-16 DIAGNOSIS — E876 Hypokalemia: Secondary | ICD-10-CM

## 2019-05-16 DIAGNOSIS — H409 Unspecified glaucoma: Secondary | ICD-10-CM | POA: Diagnosis present

## 2019-05-16 DIAGNOSIS — G4733 Obstructive sleep apnea (adult) (pediatric): Secondary | ICD-10-CM | POA: Diagnosis present

## 2019-05-16 DIAGNOSIS — I69354 Hemiplegia and hemiparesis following cerebral infarction affecting left non-dominant side: Secondary | ICD-10-CM

## 2019-05-16 DIAGNOSIS — E11649 Type 2 diabetes mellitus with hypoglycemia without coma: Secondary | ICD-10-CM | POA: Diagnosis not present

## 2019-05-16 DIAGNOSIS — Z87891 Personal history of nicotine dependence: Secondary | ICD-10-CM

## 2019-05-16 DIAGNOSIS — E162 Hypoglycemia, unspecified: Secondary | ICD-10-CM | POA: Diagnosis present

## 2019-05-16 DIAGNOSIS — I2510 Atherosclerotic heart disease of native coronary artery without angina pectoris: Secondary | ICD-10-CM | POA: Diagnosis present

## 2019-05-16 DIAGNOSIS — G890 Central pain syndrome: Secondary | ICD-10-CM | POA: Diagnosis present

## 2019-05-16 DIAGNOSIS — Z794 Long term (current) use of insulin: Secondary | ICD-10-CM | POA: Diagnosis not present

## 2019-05-16 DIAGNOSIS — M1990 Unspecified osteoarthritis, unspecified site: Secondary | ICD-10-CM | POA: Diagnosis present

## 2019-05-16 DIAGNOSIS — Z20822 Contact with and (suspected) exposure to covid-19: Secondary | ICD-10-CM | POA: Diagnosis present

## 2019-05-16 DIAGNOSIS — G40A09 Absence epileptic syndrome, not intractable, without status epilepticus: Secondary | ICD-10-CM

## 2019-05-16 DIAGNOSIS — G40909 Epilepsy, unspecified, not intractable, without status epilepticus: Secondary | ICD-10-CM

## 2019-05-16 DIAGNOSIS — Z885 Allergy status to narcotic agent status: Secondary | ICD-10-CM

## 2019-05-16 DIAGNOSIS — E1165 Type 2 diabetes mellitus with hyperglycemia: Secondary | ICD-10-CM

## 2019-05-16 DIAGNOSIS — I5032 Chronic diastolic (congestive) heart failure: Secondary | ICD-10-CM | POA: Diagnosis present

## 2019-05-16 DIAGNOSIS — J449 Chronic obstructive pulmonary disease, unspecified: Secondary | ICD-10-CM | POA: Diagnosis present

## 2019-05-16 DIAGNOSIS — Z79899 Other long term (current) drug therapy: Secondary | ICD-10-CM

## 2019-05-16 DIAGNOSIS — I252 Old myocardial infarction: Secondary | ICD-10-CM

## 2019-05-16 DIAGNOSIS — Z7902 Long term (current) use of antithrombotics/antiplatelets: Secondary | ICD-10-CM

## 2019-05-16 DIAGNOSIS — E785 Hyperlipidemia, unspecified: Secondary | ICD-10-CM | POA: Diagnosis present

## 2019-05-16 DIAGNOSIS — R569 Unspecified convulsions: Secondary | ICD-10-CM

## 2019-05-16 DIAGNOSIS — E11319 Type 2 diabetes mellitus with unspecified diabetic retinopathy without macular edema: Secondary | ICD-10-CM | POA: Diagnosis present

## 2019-05-16 DIAGNOSIS — K219 Gastro-esophageal reflux disease without esophagitis: Secondary | ICD-10-CM | POA: Diagnosis present

## 2019-05-16 DIAGNOSIS — Z8049 Family history of malignant neoplasm of other genital organs: Secondary | ICD-10-CM

## 2019-05-16 DIAGNOSIS — E739 Lactose intolerance, unspecified: Secondary | ICD-10-CM | POA: Diagnosis present

## 2019-05-16 DIAGNOSIS — Z8673 Personal history of transient ischemic attack (TIA), and cerebral infarction without residual deficits: Secondary | ICD-10-CM

## 2019-05-16 LAB — CBC WITH DIFFERENTIAL/PLATELET
Abs Immature Granulocytes: 0.02 10*3/uL (ref 0.00–0.07)
Basophils Absolute: 0 10*3/uL (ref 0.0–0.1)
Basophils Relative: 0 %
Eosinophils Absolute: 0 10*3/uL (ref 0.0–0.5)
Eosinophils Relative: 0 %
HCT: 37.6 % (ref 36.0–46.0)
Hemoglobin: 12 g/dL (ref 12.0–15.0)
Immature Granulocytes: 0 %
Lymphocytes Relative: 28 %
Lymphs Abs: 2 10*3/uL (ref 0.7–4.0)
MCH: 29.9 pg (ref 26.0–34.0)
MCHC: 31.9 g/dL (ref 30.0–36.0)
MCV: 93.8 fL (ref 80.0–100.0)
Monocytes Absolute: 0.5 10*3/uL (ref 0.1–1.0)
Monocytes Relative: 7 %
Neutro Abs: 4.5 10*3/uL (ref 1.7–7.7)
Neutrophils Relative %: 65 %
Platelets: 277 10*3/uL (ref 150–400)
RBC: 4.01 MIL/uL (ref 3.87–5.11)
RDW: 13.6 % (ref 11.5–15.5)
WBC: 7 10*3/uL (ref 4.0–10.5)
nRBC: 0 % (ref 0.0–0.2)

## 2019-05-16 LAB — LIPASE, BLOOD: Lipase: 45 U/L (ref 11–51)

## 2019-05-16 LAB — CBC
HCT: 40 % (ref 36.0–46.0)
Hemoglobin: 12.5 g/dL (ref 12.0–15.0)
MCH: 29.8 pg (ref 26.0–34.0)
MCHC: 31.3 g/dL (ref 30.0–36.0)
MCV: 95.2 fL (ref 80.0–100.0)
Platelets: 287 10*3/uL (ref 150–400)
RBC: 4.2 MIL/uL (ref 3.87–5.11)
RDW: 13.4 % (ref 11.5–15.5)
WBC: 6.8 10*3/uL (ref 4.0–10.5)
nRBC: 0 % (ref 0.0–0.2)

## 2019-05-16 LAB — URINALYSIS, COMPLETE (UACMP) WITH MICROSCOPIC
Bilirubin Urine: NEGATIVE
Glucose, UA: >=500 mg/dL — AB
Hgb urine dipstick: NEGATIVE
Ketones, ur: NEGATIVE mg/dL
Nitrite: POSITIVE — AB
Protein, ur: NEGATIVE mg/dL
Specific Gravity, Urine: 1.005 (ref 1.005–1.030)
pH: 6 (ref 5.0–8.0)

## 2019-05-16 LAB — RESPIRATORY PANEL BY RT PCR (FLU A&B, COVID)
Influenza A by PCR: NEGATIVE
Influenza B by PCR: NEGATIVE
SARS Coronavirus 2 by RT PCR: NEGATIVE

## 2019-05-16 LAB — GLUCOSE, CAPILLARY
Glucose-Capillary: 30 mg/dL — CL (ref 70–99)
Glucose-Capillary: 82 mg/dL (ref 70–99)
Glucose-Capillary: 40 mg/dL — CL (ref 70–99)
Glucose-Capillary: 83 mg/dL (ref 70–99)
Glucose-Capillary: 103 mg/dL — ABNORMAL HIGH (ref 70–99)
Glucose-Capillary: 85 mg/dL (ref 70–99)

## 2019-05-16 LAB — CREATININE, SERUM
Creatinine, Ser: 0.71 mg/dL (ref 0.44–1.00)
GFR calc Af Amer: >60 mL/min (ref >60)
GFR calc non Af Amer: >60 mL/min (ref >60)

## 2019-05-16 LAB — COMPREHENSIVE METABOLIC PANEL
ALT: 28 U/L (ref 0–44)
AST: 19 U/L (ref 15–41)
Albumin: 3.5 g/dL (ref 3.5–5.0)
Alkaline Phosphatase: 58 U/L (ref 38–126)
Anion gap: 9 (ref 5–15)
BUN: 18 mg/dL (ref 6–20)
CO2: 24 mmol/L (ref 22–32)
Calcium: 8.7 mg/dL — ABNORMAL LOW (ref 8.9–10.3)
Chloride: 104 mmol/L (ref 98–111)
Creatinine, Ser: 0.7 mg/dL (ref 0.44–1.00)
GFR calc Af Amer: >60 mL/min (ref >60)
GFR calc non Af Amer: >60 mL/min (ref >60)
Glucose, Bld: 171 mg/dL — ABNORMAL HIGH (ref 70–99)
Potassium: 3.5 mmol/L (ref 3.5–5.1)
Sodium: 137 mmol/L (ref 135–145)
Total Bilirubin: 0.3 mg/dL (ref 0.3–1.2)
Total Protein: 7.2 g/dL (ref 6.5–8.1)

## 2019-05-16 LAB — TROPONIN I (HIGH SENSITIVITY)
Troponin I (High Sensitivity): 5 ng/L (ref <18)
Troponin I (High Sensitivity): 5 ng/L (ref <18)

## 2019-05-16 MED ORDER — AMLODIPINE BESYLATE 5 MG PO TABS: 5.0000 mg | ORAL_TABLET | Freq: Every day | ORAL | Status: DC

## 2019-05-16 MED ORDER — ACETAMINOPHEN 325 MG PO TABS
650.0000 mg | ORAL_TABLET | Freq: Four times a day (QID) | ORAL | Status: DC | PRN
  Administered 2019-05-17 – 2019-05-18 (×4): 650 mg via ORAL
  Filled 2019-05-16 (×4): qty 2

## 2019-05-16 MED ORDER — PANTOPRAZOLE SODIUM 40 MG PO TBEC
40.0000 mg | DELAYED_RELEASE_TABLET | Freq: Every day | ORAL | Status: DC
  Administered 2019-05-17 – 2019-05-21 (×5): 40 mg via ORAL
  Filled 2019-05-16 (×6): qty 1

## 2019-05-16 MED ORDER — DEXTROSE 50 % IV SOLN
1.0000 | Freq: Once | INTRAVENOUS | Status: AC
  Administered 2019-05-16: 18:00:00 50 mL via INTRAVENOUS

## 2019-05-16 MED ORDER — SENNOSIDES-DOCUSATE SODIUM 8.6-50 MG PO TABS
1.0000 | ORAL_TABLET | Freq: Every evening | ORAL | Status: DC | PRN
  Administered 2019-05-17: 1 via ORAL
  Filled 2019-05-16: qty 1

## 2019-05-16 MED ORDER — EZETIMIBE 10 MG PO TABS
10.0000 mg | ORAL_TABLET | Freq: Every day | ORAL | Status: DC
  Administered 2019-05-16 – 2019-05-21 (×6): 10 mg via ORAL
  Filled 2019-05-16 (×6): qty 1

## 2019-05-16 MED ORDER — NITROGLYCERIN 0.4 MG SL SUBL: 0.4000 mg | SUBLINGUAL_TABLET | SUBLINGUAL | Status: DC | PRN

## 2019-05-16 MED ORDER — SIMVASTATIN 20 MG PO TABS
40.0000 mg | ORAL_TABLET | Freq: Every day | ORAL | Status: DC
  Administered 2019-05-16 – 2019-05-20 (×5): 40 mg via ORAL
  Filled 2019-05-16 (×5): qty 2

## 2019-05-16 MED ORDER — DEXTROSE 50 % IV SOLN
INTRAVENOUS | Status: AC
  Administered 2019-05-16: 17:00:00 50 mL via INTRAVENOUS
  Filled 2019-05-16: qty 50

## 2019-05-16 MED ORDER — ENOXAPARIN SODIUM 40 MG/0.4ML ~~LOC~~ SOLN
40.0000 mg | SUBCUTANEOUS | Status: DC
  Administered 2019-05-16 – 2019-05-20 (×5): 40 mg via SUBCUTANEOUS
  Filled 2019-05-16 (×5): qty 0.4

## 2019-05-16 MED ORDER — INSULIN ASPART 100 UNIT/ML ~~LOC~~ SOLN: 0.0000 [IU] | Freq: Three times a day (TID) | SUBCUTANEOUS | Status: DC

## 2019-05-16 MED ORDER — ONDANSETRON HCL 4 MG/2ML IJ SOLN: 4.0000 mg | Freq: Four times a day (QID) | INTRAMUSCULAR | Status: DC | PRN

## 2019-05-16 MED ORDER — CARVEDILOL 3.125 MG PO TABS
12.5000 mg | ORAL_TABLET | Freq: Two times a day (BID) | ORAL | Status: DC
  Administered 2019-05-17 – 2019-05-21 (×9): 12.5 mg via ORAL
  Filled 2019-05-16 (×9): qty 4

## 2019-05-16 MED ORDER — DEXTROSE 50 % IV SOLN: 50.0000 mL | Freq: Once | INTRAVENOUS | Status: AC

## 2019-05-16 MED ORDER — AMLODIPINE BESYLATE 5 MG PO TABS
5.0000 mg | ORAL_TABLET | Freq: Once | ORAL | Status: AC
  Administered 2019-05-16: 5 mg via ORAL
  Filled 2019-05-16: qty 1

## 2019-05-16 MED ORDER — ACETAMINOPHEN 650 MG RE SUPP: 650.0000 mg | Freq: Four times a day (QID) | RECTAL | Status: DC | PRN

## 2019-05-16 MED ORDER — DEXTROSE 10 % IV SOLN: INTRAVENOUS | Status: DC

## 2019-05-16 MED ORDER — ONDANSETRON HCL 4 MG PO TABS: 4.0000 mg | ORAL_TABLET | Freq: Four times a day (QID) | ORAL | Status: DC | PRN

## 2019-05-16 NOTE — ED Triage Notes (Signed)
Pt presents to ED via [facility] from [name] c/o hypoglycemia. Seen for same yesterday. EMS reports initial CBG was 35, pt given 469m D10, last CBG 89. Pt states CBG this morning was 170 and she took 18units SS coverage.

## 2019-05-16 NOTE — ED Provider Notes (Addendum)
[facility] Emergency Department Provider Note  ____________________________________________  Time seen: Approximately 5:47 PM  I have reviewed the triage vital signs and the nursing notes.   HISTORY  Chief Complaint Hypoglycemia    HPI [name] is a 54 y.o. female with a history of COPD depression diabetes diastolic dysfunction GERD who comes the ED  by EMS due to hypoglycemia.  Today she went to her doctor's office where she was confused and having trouble walking straight, found to be hypoglycemic.  EMS reports initial blood sugar was 35.  They gave the patient 400 mL of D10 which improved the blood sugar to about 90.  Patient became more alert and feeling better.  Patient denies any acute illness recently, no fevers chills chest pain shortness of breath cough dysuria or abdominal pain.  No vomiting or diarrhea, feels like she is eating normally and taking her medications as usual.     Past Medical History:  Diagnosis Date  . Anxiety and depression 09/13/2006   Qualifier: Diagnosis of  By: Hassell Done [name], Tori Milks    . Arthritis    joint pain   . Asthma   . COPD (chronic obstructive pulmonary disease) (HCC)    chronic bronchitis   . Depression   . Diabetes mellitus    since age 23; type 2 IDDM  . Diabetic neuropathy, painful ([place])    FEET AND HANDS  . Diabetic retinopathy ([place])   . Diastolic dysfunction    a.  Echo 11/17: EF 60-65%, mild LVH, no RWMA, Gr1DD, mild AI, dilated aortic root measuring 38 mm, mildly dilated ascending aorta  . Dilated aortic root ([place])    61m by echo 11/2015  . Dizziness    secondary to diabetes and hypertension   . Epilepsy idiopathic petit mal ([place]    last seizure 2012;controlled w/ topomax  . GERD (gastroesophageal reflux disease)   . Glaucoma    NOT ON ANY EYE DROPS   . Headache(784.0)    migraines   . Heart murmur    born with   . History of stress test    a. 11/17: Normal perfusion, EF 53%,  normal study  . Hx of blood clots    hematomas removed from left side of brain from [id number] [id number]   . Hyperlipidemia   . Hypertension   . Legally blind    left eye   . MIGRAINE HEADACHE 09/14/2006   Qualifier: Diagnosis of  By: MarHassell DoneP, NykTori Milks . Myocardial infarction ([place]CC[place]om[place]  a.  Patient reported history of without objective documentation  . Nephrolithiasis    frequent urination , urination at nite  PT SEEN IN ER 09/22/11 FOR BACK AND RT SIDED PAIN--HAS KNOWN STONE RT URETER AND UA IN ER SHOWED UTI  . Pain 09/23/11   LOWER BACK AND RIGHT SIDE--PT HAS RIGHT URETERAL STONE  . Pneumonia    hx of 2009  . Pyelonephritis   . Seizures ([place]CC[place] . Sleep apnea    [facility] ; mild  . Stress incontinence   . [facility]  [facility]  RESIDUAL LEFT LEG WEAKNESS--NO OTHER RESIDUAL PROBLEMS     Patient Active Problem List   Diagnosis Date Noted  . Chest pain 04/27/2019  . Type 2 diabetes mellitus without complication, with long-term current use of insulin (HCCLog Lane Village4/08/2019  . COPD (chronic obstructive pulmonary disease) (HCCEgeg[place]4/08/2019  . CAD (coronary  artery disease) 04/27/2019  . H/O: stroke with residual effects   . Myalgia 04/19/2019  . 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 Non-intractable vomiting 03/01/2019  . History of CVA (cerebrovascular accident) 07/08/2018  . History of seizure 07/08/2018  . Diabetic polyneuropathy associated with type 2 diabetes mellitus ([place]) 03/02/2018  . Obesity (BMI 30-39.9) 03/02/2018  . Vitamin D deficiency 01/24/2018  . Vulvovaginal candidiasis 01/24/2018  . Hypokalemia 06/08/2017  . Overactive bladder 05/18/2017  . Asthma 06/22/2016  . Dilated aortic root ([place])   . Sacroiliac dysfunction 10/09/2014  . Chronic low back pain 09/03/2014  . CVA, old, hemiparesis ([place]) 09/03/2014  . OSA (obstructive sleep apnea) 05/15/2014  . Restrictive airway disease 05/15/2014  . Colitis 02/22/2014  . Type 2  diabetes, uncontrolled, with renal manifestation ([place]) 01/21/2014  . Chronic pain syndrome 01/21/2014  . Headache disorder 09/07/2013  . Diabetic neuropathy ([place]) 05/23/2013  . Mixed urge and stress incontinence 08/25/2011  . Ureteral stricture, right 08/25/2011  . Pyelonephritis 08/22/2011  . Nephrolithiasis 08/22/2011  . Incisional hernia 05/10/2011  . Morbid obesity ([place]) 05/10/2011  . Tachycardia 05/08/2011  . BLINDNESS, [place], [place] DEFINITION 09/14/2006  . Hyperlipemia 09/13/2006  . Essential hypertension 09/13/2006  . MYOCARDIAL INFARCTION, HX OF 09/13/2006  . GERD 09/13/2006  . Seizure disorder ([place]) 09/13/2006     Past Surgical History:  Procedure Laterality Date  . BRAIN HEMATOMA EVACUATION     five procedures total, first procedure when 18 months old, last at 54 years of age.  . CYSTOSCOPY W/ RETROGRADES  09/24/2011   Procedure: CYSTOSCOPY WITH RETROGRADE PYELOGRAM;  Surgeon: Malka So, MD;  Location: [facility];  Service: Urology;  Laterality: Right;  . CYSTOSCOPY WITH URETEROSCOPY  09/24/2011   Procedure: CYSTOSCOPY WITH URETEROSCOPY;  Surgeon: Malka So, MD;  Location: [facility];  Service: Urology;  Laterality: Right;  Balloon dilation right ureter   . CYSTOSCOPY/RETROGRADE/URETEROSCOPY  08/24/2011   Procedure: CYSTOSCOPY/RETROGRADE/URETEROSCOPY;  Surgeon: Molli Hazard, MD;  Location: [facility];  Service: Urology;  Laterality: Right;  Cysto, Right retrograde Pyelogram, right stent placement.   Fatima Blank HERNIA REPAIR  05/05/2011   Procedure: LAPAROSCOPIC INCISIONAL HERNIA;  Surgeon: Rolm Bookbinder, MD;  Location: [facility];  Service: General;  Laterality: N/A;  . kidney stone removal    . MASS EXCISION  05/05/2011   Procedure: EXCISION MASS;  Surgeon: Rolm Bookbinder, MD;  Location: [facility];  Service: General;  Laterality: Right;  . PCNL    . RETINAL DETACHMENT SURGERY Left 1990  . RIGHT/LEFT HEART CATH AND CORONARY ANGIOGRAPHY N/A 04/28/2019   Procedure: RIGHT/LEFT  HEART CATH AND CORONARY ANGIOGRAPHY;  Surgeon: Yolonda Kida, MD;  Location: [facility];  Service: Cardiovascular;  Laterality: N/A;  . SHUNT REMOVAL     shunt inserted at age 59 removed at age 44   . VAGINAL HYSTERECTOMY  1996     Prior to Admission medications   Medication Sig Start Date End Date Taking? Authorizing Provider  albuterol (ACCUNEB) 1.25 MG/3ML nebulizer solution Inhale into the lungs. Use 3 mls by nebulization every 6 hours as needed for wheezing 01/26/19 01/26/20  [provider]  albuterol (PROAIR HFA) 108 (90 Base) MCG/ACT inhaler INHALE 2 PUFFS BY MOUTH EVERY 6 HOURS AS NEEDED FOR WHEEZING OR SHORTNESS OF BREATH 01/24/18   Hubbard Hartshorn, [name]  AMBULATORY NON FORMULARY MEDICATION Medication Name: Muscle and Joint Vanishing Scent Gel- Apply as needed to skin    [provider]  budesonide (PULMICORT)  0.5 MG/2ML nebulizer solution Inhale 0.5 mg into the lungs daily. Take 2 mls by nebulizations once daily  01/26/19 01/26/20  [provider]  Calcium Carbonate-Vitamin D (CALCIUM-VITAMIN D3 PO) D3 500- calcium 630 mg Take 1 capsule by mouth once daily    [provider]  carvedilol (COREG) 12.5 MG tablet Take 1 tablet (12.5 mg total) by mouth 2 (two) times daily after a meal. 05/05/19 06/04/19  Towanda Malkin, MD  clopidogrel (PLAVIX) 75 MG tablet Take 1 tablet (75 mg total) by mouth daily. 10/07/18   Steele Sizer, MD  cycloSPORINE (RESTASIS) 0.05 % ophthalmic emulsion Place 2 drops into both eyes 2 (two) times daily.    [provider]  Dulaglutide 1.5 MG/0.5ML SOPN Inject 1.5 mg into the skin once a week. 10/20/18   Hubbard Hartshorn, [name]  DULoxetine (CYMBALTA) 20 MG capsule Take 2 capsules q am and 1 capsule q pm 05/05/19   Towanda Malkin, MD  DUPIXENT 300 MG/2ML SOPN Inject 300 mg into the skin every 14 (fourteen) days.  04/04/19   [provider]  EPINEPHrine 0.3 mg/0.3 mL IJ SOAJ injection Inject 0.3 mg  into the muscle as needed for anaphylaxis.  03/29/19   [provider]  ezetimibe (ZETIA) 10 MG tablet Take 1 tablet (10 mg total) by mouth daily. 04/18/19   Towanda Malkin, MD  fluticasone (FLONASE) 50 MCG/ACT nasal spray Place 2 sprays into both nostrils daily. 04/18/19   Towanda Malkin, MD  Fluticasone-Salmeterol (WIXELA INHUB) 100-50 MCG/DOSE AEPB Inhale 1 puff into the lungs 2 (two) times daily.    [provider]  gabapentin (NEURONTIN) 400 MG capsule 400 mg qAM, 400 mg qPM, 800 mg qhs 04/19/19   Gillis Santa, MD  insulin aspart (NOVOLOG FLEXPEN) 100 UNIT/ML FlexPen Inject 18-50 Units into the skin 3 (three) times daily with meals.  06/08/18 06/08/19  [provider]  Insulin Glargine, 2 Unit Dial, 300 UNIT/ML SOPN Inject 120 Units into the skin daily. Patient taking differently: Inject 136 Units into the skin daily.  03/30/18   Hubbard Hartshorn, [name]  levETIRAcetam (KEPPRA) 500 MG tablet Take 500 mg by mouth 2 (two) times daily. 1 TAB IN THE MORNING AND 1.5 TABS AT NIGHT 07/08/18   [provider]  losartan (COZAAR) 50 MG tablet Take 50 mg by mouth daily.    [provider]  mirabegron ER (MYRBETRIQ) 50 MG TB24 tablet Take 1 tablet (50 mg total) by mouth daily. 04/18/19   Towanda Malkin, MD  nitroGLYCERIN (NITROSTAT) 0.4 MG SL tablet Place 1 tablet (0.4 mg total) under the tongue every 5 (five) minutes as needed for chest pain. Patient not taking: Reported on 05/05/2019 04/29/19 05/29/19  Sidney Ace, MD  ondansetron (ZOFRAN ODT) 4 MG disintegrating tablet Take 1 tablet (4 mg total) by mouth every 8 (eight) hours as needed for nausea or vomiting. 04/18/19   Towanda Malkin, MD  oxyCODONE-acetaminophen (PERCOCET) 5-325 MG tablet Take 1 tablet by mouth every 12 (twelve) hours as needed for severe pain. 04/19/19 05/19/19  Gillis Santa, MD  pantoprazole (PROTONIX) 40 MG tablet Take 1 tablet (40 mg total) by mouth daily. 07/21/18    Hubbard Hartshorn, [name]  polyethylene glycol (MIRALAX / GLYCOLAX) packet Take 17 g by mouth daily as needed for mild constipation. 07/30/16   Lauree Chandler, NP  potassium chloride SA (KLOR-CON M20) 20 MEQ tablet Take 2 tablets (40 mEq total) by mouth daily. 03/01/19  Towanda Malkin, MD  predniSONE (DELTASONE) 20 MG tablet Take 20 mg by mouth daily.  01/26/19   [provider]  simvastatin (ZOCOR) 40 MG tablet Take 40 mg by mouth daily.    [provider]  spironolactone (ALDACTONE) 25 MG tablet Take 1 tablet by mouth daily. 01/23/19   [provider]  terconazole (TERAZOL 7) 0.4 % vaginal cream Place 1 applicator vaginally at bedtime as needed (irritation).    [provider]     Allergies Codeine and Lactose intolerance (gi)   Family History  Problem Relation Age of Onset  . Uterine cancer Mother   . Hypertension Mother   . Cancer Mother   . Brain cancer Maternal Grandmother   . Hypertension Maternal Grandmother   . Birth defects Daughter   . Hypertension Daughter   . Cirrhosis Maternal Grandfather   . ADD / ADHD Maternal Grandfather   . Birth defects Maternal Grandfather   . Diabetes Maternal Grandfather   . Anesthesia problems Neg Hx   . Colon cancer Neg Hx   . Esophageal cancer Neg Hx   . Pancreatic cancer Neg Hx     Social History Social History   Tobacco Use  . Smoking status: Former Smoker    Packs/day: 1.00    Years: 15.00    Pack years: 15.00    Types: Cigarettes    Quit date: 01/20/1987    Years since quitting: 32.3  . Smokeless tobacco: Never Used  . Tobacco comment: quit more than 20 years  Substance Use Topics  . Alcohol use: Never    Alcohol/week: 0.0 standard drinks  . Drug use: No    Comment: hx of marijuana use no longer uses     Review of Systems  Constitutional:   No fever or chills.  ENT:   No sore throat. No rhinorrhea. Cardiovascular:   No chest pain or syncope. Respiratory:   No dyspnea or  cough. Gastrointestinal:   Negative for abdominal pain, vomiting and diarrhea.  Musculoskeletal:   Negative for focal pain or swelling All other systems reviewed and are negative except as documented above in ROS and HPI.  ____________________________________________   PHYSICAL EXAM:  VITAL SIGNS: ED Triage Vitals  Enc Vitals Group     BP 05/16/19 1643 (!) 176/98     Pulse Rate 05/16/19 1643 61     Resp 05/16/19 1643 18     Temp --      Temp src --      SpO2 05/16/19 1643 100 %     Weight 05/16/19 1644 238 lb 1.6 oz (108 kg)     Height 05/16/19 1644 5' 6"  (1.676 m)     Head Circumference --      Peak Flow --      Pain Score 05/16/19 1644 0     Pain Loc --      Pain Edu? --      Excl. in [place]? --     Vital signs reviewed, nursing assessments reviewed.   Constitutional:   Alert and oriented. Non-toxic appearance.  Obese Eyes:   Conjunctivae are normal. EOMI. PERRL. ENT      Head:   Normocephalic and atraumatic.      Nose:   Wearing a mask.      Mouth/Throat:   Wearing a mask.      Neck:   No meningismus. Full ROM. Hematological/Lymphatic/Immunilogical:   No cervical lymphadenopathy. Cardiovascular:   RRR. Symmetric bilateral radial and DP  pulses.  No murmurs. Cap refill less than 2 seconds. Respiratory:   Normal respiratory effort without tachypnea/retractions. Breath sounds are clear and equal bilaterally. No wheezes/rales/rhonchi. Gastrointestinal:   Soft and nontender. Non distended. There is no CVA tenderness.  No rebound, rigidity, or guarding. Musculoskeletal:   Normal range of motion in all extremities. No joint effusions.  No lower extremity tenderness.  No edema. Neurologic:   Normal speech and language.  Motor grossly intact. No acute focal neurologic deficits are appreciated.  Skin:    Skin is warm, dry and intact. No rash noted.  No petechiae, purpura, or bullae.  ____________________________________________    LABS (pertinent positives/negatives) (all  labs ordered are listed, but only abnormal results are displayed) Labs Reviewed  GLUCOSE, CAPILLARY - Abnormal; Notable for the following components:      Result Value   Glucose-Capillary 30 (*)    All other components within normal limits  COMPREHENSIVE METABOLIC PANEL - Abnormal; Notable for the following components:   Glucose, Bld 171 (*)    Calcium 8.7 (*)    All other components within normal limits  GLUCOSE, CAPILLARY - Abnormal; Notable for the following components:   Glucose-Capillary 40 (*)    All other components within normal limits  RESPIRATORY PANEL BY RT PCR (FLU A&B, COVID)  GLUCOSE, CAPILLARY  LIPASE, BLOOD  CBC WITH DIFFERENTIAL/PLATELET  URINALYSIS, COMPLETE (UACMP) WITH MICROSCOPIC  TROPONIN I (HIGH SENSITIVITY)   ____________________________________________   EKG  Interpreted by me Normal sinus rhythm rate of 67, normal axis and intervals.  Normal QRS ST segments and T waves.  No acute ischemic changes.  ____________________________________________    RADIOLOGY  DG Humerus Left  Result Date: 05/15/2019 CLINICAL DATA:  Left arm pain after possible fall EXAM: LEFT HUMERUS - 2+ VIEW COMPARISON:  None. FINDINGS: There is no evidence of fracture or other focal bone lesions. Soft tissues are unremarkable. IMPRESSION: Neg negative radiographs of the left humerus. Electronically Signed   By: Audie Pinto M.D.   On: 05/15/2019 19:12    ____________________________________________   PROCEDURES .Critical Care Performed by: Carrie Mew, MD Authorized by: Carrie Mew, MD   Critical care provider statement:    Critical care time (minutes):  35   Critical care time was exclusive of:  Separately billable procedures and treating other patients   Critical care was necessary to treat or prevent imminent or life-threatening deterioration of the following conditions:  CNS failure or compromise and endocrine crisis   Critical care was time spent  personally by me on the following activities:  Development of treatment plan with patient or surrogate, discussions with consultants, evaluation of patient's response to treatment, examination of patient, obtaining history from patient or surrogate, ordering and performing treatments and interventions, ordering and review of laboratory studies, ordering and review of radiographic studies, pulse oximetry, re-evaluation of patient's condition and review of old charts    ____________________________________________    CLINICAL IMPRESSION / ASSESSMENT AND PLAN / ED COURSE  Medications ordered in the ED: Medications  dextrose 10 % infusion ( Intravenous New Bag/Given 05/16/19 1747)  dextrose 50 % solution 50 mL (50 mLs Intravenous Given 05/16/19 1700)  dextrose 50 % solution 50 mL (50 mLs Intravenous Given 05/16/19 1826)    Pertinent labs & imaging results that were available during my care of the patient were reviewed by me and considered in my medical decision making (see chart for details).  [name] was evaluated in Emergency Department on 05/16/2019 for the symptoms  described in the history of present illness. She was evaluated in the context of the global COVID-19 pandemic, which necessitated consideration that the patient might be at risk for infection with the SARS-CoV-2 virus that causes COVID-19. Institutional protocols and algorithms that pertain to the evaluation of patients at risk for COVID-19 are in a state of rapid change based on information released by regulatory bodies including the CDC and federal and state organizations. These policies and algorithms were followed during the patient's care in the ED.   Patient presents with recurrent hypoglycemia in the setting of long-acting insulin use.  She was seen in the ED yesterday and blood sugar seem to have stabilized, she went home.  She reports no changes to her medication regimen recently, no acute illness but is having  persistent hypoglycemia today requiring dextrose infusion by EMS and again on arrival to the emergency department with associated altered mental status with confusion and stupor.  She will need to be admitted on a D10 continuous infusion to prevent recurrent hypoglycemia for work-up and medication titration.  Clinical Course as of May 16 1827  Tue May 16, 2019  5872 Recurrent hypoglycemia. Will give another amp d50, increase d10 infusion to 222m/hr   [PS]    Clinical Course User Index [PS] SCarrie Mew MD     ----------------------------------------- 6:29 PM on 05/16/2019 -----------------------------------------  Chemistry labs unremarkable, blood counts unremarkable.  Will page hospitalist  ____________________________________________   FINAL CLINICAL IMPRESSION(S) / ED DIAGNOSES    Final diagnoses:  Type 2 diabetes mellitus with hypoglycemia without coma, with long-term current use of insulin ([place]  Morbid obesity ([facility]     ED Discharge Orders    None      Portions of this note were generated with dragon dictation software. Dictation errors may occur despite best attempts at proofreading.   SCarrie Mew MD 05/16/19 1753    SCarrie Mew MD 05/16/19 [phone]

## 2019-05-16 NOTE — H&P (Signed)
History and Physical    [name] QMG:[id number] DOB: 1965/03/19 DOA: 05/16/2019  PCP: Hubbard Hartshorn, [name]   Patient coming from: Home  I have personally briefly reviewed patient's old medical records in [place]  Chief Complaint: Altered mental status, hypoglycemia  HPI: [name] is a 54 y.o. female with medical history significant for COPD, diabetes, OSA on CPAP, diastolic heart failure and GERD who presents to the emergency room for the second time in 2 days for evaluation of altered mental status.  On her first visit to the emergency room yesterday, blood sugar was in the 30s, she was treated in the emergency room and discharge.  Today she went to a doctor's appointment and was found to be confused and unsteady on her feet and her blood sugar was 35.  EMS reported blood sugar of 35 improving to 90 with 400 mL of D10 to which she responded and became more alert.  Patient denied any recent illness, cough, shortness of breath fever or chills, she denied chest pain, nausea vomiting, abdominal pain or change in bowel habit, headache or visual disturbance.  She also denies any recent changes or addition to her medication  ED Course: Upon arrival in the emergency room vitals were within normal limits except for slightly elevated blood pressure of 176/98.  Initial blood sugar was 30, improving to 82 with D50 however it dropped back down to 40 in spite of being on D10 at 200 mils per hour.  Her other labs were unremarkable, troponin4>5.  EKG showed normal sinus rhythm with no acute ST-T wave changes.  Hospitalist consulted for admission.  Review of Systems: As per HPI otherwise 10 point review of systems negative.    Past Medical History:  Diagnosis Date  . Anxiety and depression 09/13/2006   Qualifier: Diagnosis of  By: Hassell Done [name], Tori Milks    . Arthritis    joint pain   . Asthma   . COPD (chronic obstructive pulmonary disease) (HCC)    chronic bronchitis   .  Depression   . Diabetes mellitus    since age 43; type 2 IDDM  . Diabetic neuropathy, painful ([place])    FEET AND HANDS  . Diabetic retinopathy ([place])   . Diastolic dysfunction    a.  Echo 11/17: EF 60-65%, mild LVH, no RWMA, Gr1DD, mild AI, dilated aortic root measuring 38 mm, mildly dilated ascending aorta  . Dilated aortic root ([place])    81m by echo 11/2015  . Dizziness    secondary to diabetes and hypertension   . Epilepsy idiopathic petit mal ([place]    last seizure 2012;controlled w/ topomax  . GERD (gastroesophageal reflux disease)   . Glaucoma    NOT ON ANY EYE DROPS   . Headache(784.0)    migraines   . Heart murmur    born with   . History of stress test    a. 11/17: Normal perfusion, EF 53%, normal study  . Hx of blood clots    hematomas removed from left side of brain from [id number]   . Hyperlipidemia   . Hypertension   . Legally blind    left eye   . MIGRAINE HEADACHE 09/14/2006   Qualifier: Diagnosis of  By: MarHassell DoneP, NykTori Milks . Myocardial infarction ([facility]  a.  Patient reported history of without objective documentation  . Nephrolithiasis    frequent urination , urination at nite  PT SEEN IN ER  09/22/11 FOR BACK AND RT SIDED PAIN--HAS KNOWN STONE RT URETER AND UA IN ER SHOWED UTI  . Pain 09/23/11   LOWER BACK AND RIGHT SIDE--PT HAS RIGHT URETERAL STONE  . Pneumonia    hx of 2009  . Pyelonephritis   . Seizures ([place])   . Sleep apnea    [facility]does not use [facility] ; mild  . Stress incontinence   . Stroke [facility])    last 2003  RESIDUAL LEFT LEG WEAKNESS--NO OTHER RESIDUAL PROBLEMS    Past Surgical History:  Procedure Laterality Date  . BRAIN HEMATOMA EVACUATION     five procedures total, first procedure when 80 months old, last at 54 years of age.  . CYSTOSCOPY W/ RETROGRADES  09/24/2011   Procedure: CYSTOSCOPY WITH RETROGRADE PYELOGRAM;  Surgeon: Malka So, MD;  Location: [facility];  Service: Urology;  Laterality: Right;  .  CYSTOSCOPY WITH URETEROSCOPY  09/24/2011   Procedure: CYSTOSCOPY WITH URETEROSCOPY;  Surgeon: Malka So, MD;  Location: [facility];  Service: Urology;  Laterality: Right;  Balloon dilation right ureter   . CYSTOSCOPY/RETROGRADE/URETEROSCOPY  08/24/2011   Procedure: CYSTOSCOPY/RETROGRADE/URETEROSCOPY;  Surgeon: Molli Hazard, MD;  Location: [facility];  Service: Urology;  Laterality: Right;  Cysto, Right retrograde Pyelogram, right stent placement.   Fatima Blank HERNIA REPAIR  05/05/2011   Procedure: LAPAROSCOPIC INCISIONAL HERNIA;  Surgeon: Rolm Bookbinder, MD;  Location: [place];  Service: General;  Laterality: N/A;  . kidney stone removal    . MASS EXCISION  05/05/2011   Procedure: EXCISION MASS;  Surgeon: Rolm Bookbinder, MD;  Location: [place];  Service: General;  Laterality: Right;  . PCNL    . RETINAL DETACHMENT SURGERY Left 1990  . RIGHT/LEFT HEART CATH AND CORONARY ANGIOGRAPHY N/A 04/28/2019   Procedure: RIGHT/LEFT HEART CATH AND CORONARY ANGIOGRAPHY;  Surgeon: Yolonda Kida, MD;  Location: [place] [facility];  Service: Cardiovascular;  Laterality: N/A;  . SHUNT REMOVAL     shunt inserted at age 58 removed at age 44   . VAGINAL HYSTERECTOMY  1996     reports that she quit smoking about 32 years ago. Her smoking use included cigarettes. She has a 15.00 pack-year smoking history. She has never used smokeless tobacco. She reports that she does not drink alcohol or use drugs.  Allergies  Allergen Reactions  . Codeine Swelling and Other (See Comments)    Swelling and burning of mouth (inside)  . Lactose Intolerance (Gi) Nausea And Vomiting    Family History  Problem Relation Age of Onset  . Uterine cancer Mother   . Hypertension Mother   . Cancer Mother   . Brain cancer Maternal Grandmother   . Hypertension Maternal Grandmother   . Birth defects Daughter   . Hypertension Daughter   . Cirrhosis Maternal Grandfather   . ADD / ADHD Maternal Grandfather   . Birth defects  Maternal Grandfather   . Diabetes Maternal Grandfather   . Anesthesia problems Neg Hx   . Colon cancer Neg Hx   . Esophageal cancer Neg Hx   . Pancreatic cancer Neg Hx      Prior to Admission medications   Medication Sig Start Date End Date Taking? Authorizing Provider  albuterol (ACCUNEB) 1.25 MG/3ML nebulizer solution Inhale into the lungs. Use 3 mls by nebulization every 6 hours as needed for wheezing 01/26/19 01/26/20  [provider]  albuterol (PROAIR HFA) 108 (90 Base) MCG/ACT inhaler INHALE 2 PUFFS BY MOUTH EVERY 6  HOURS AS NEEDED FOR WHEEZING OR SHORTNESS OF BREATH 01/24/18   Hubbard Hartshorn, [name]  AMBULATORY NON FORMULARY MEDICATION Medication Name: Muscle and Joint Vanishing Scent Gel- Apply as needed to skin    [provider]  budesonide (PULMICORT) 0.5 MG/2ML nebulizer solution Inhale 0.5 mg into the lungs daily. Take 2 mls by nebulizations once daily  01/26/19 01/26/20  [provider]  Calcium Carbonate-Vitamin D (CALCIUM-VITAMIN D3 PO) D3 500- calcium 630 mg Take 1 capsule by mouth once daily    [provider]  carvedilol (COREG) 12.5 MG tablet Take 1 tablet (12.5 mg total) by mouth 2 (two) times daily after a meal. 05/05/19 06/04/19  Towanda Malkin, MD  clopidogrel (PLAVIX) 75 MG tablet Take 1 tablet (75 mg total) by mouth daily. 10/07/18   Steele Sizer, MD  cycloSPORINE (RESTASIS) 0.05 % ophthalmic emulsion Place 2 drops into both eyes 2 (two) times daily.    [provider]  Dulaglutide 1.5 MG/0.5ML SOPN Inject 1.5 mg into the skin once a week. 10/20/18   Hubbard Hartshorn, [name]  DULoxetine (CYMBALTA) 20 MG capsule Take 2 capsules q am and 1 capsule q pm 05/05/19   Towanda Malkin, MD  DUPIXENT 300 MG/2ML SOPN Inject 300 mg into the skin every 14 (fourteen) days.  04/04/19   [provider]  EPINEPHrine 0.3 mg/0.3 mL IJ SOAJ injection Inject 0.3 mg into the muscle as needed for anaphylaxis.  03/29/19   [provider]  ezetimibe (ZETIA) 10 MG tablet Take 1 tablet (10 mg total) by mouth daily. 04/18/19   Towanda Malkin, MD  fluticasone (FLONASE) 50 MCG/ACT nasal spray Place 2 sprays into both nostrils daily. 04/18/19   Towanda Malkin, MD  Fluticasone-Salmeterol (WIXELA INHUB) 100-50 MCG/DOSE AEPB Inhale 1 puff into the lungs 2 (two) times daily.    [provider]  gabapentin (NEURONTIN) 400 MG capsule 400 mg qAM, 400 mg qPM, 800 mg qhs 04/19/19   Gillis Santa, MD  insulin aspart (NOVOLOG FLEXPEN) 100 UNIT/ML FlexPen Inject 18-50 Units into the skin 3 (three) times daily with meals.  06/08/18 06/08/19  [provider]  Insulin Glargine, 2 Unit Dial, 300 UNIT/ML SOPN Inject 120 Units into the skin daily. Patient taking differently: Inject 136 Units into the skin daily.  03/30/18   Hubbard Hartshorn, [name]  levETIRAcetam (KEPPRA) 500 MG tablet Take 500 mg by mouth 2 (two) times daily. 1 TAB IN THE MORNING AND 1.5 TABS AT NIGHT 07/08/18   [provider]  losartan (COZAAR) 50 MG tablet Take 50 mg by mouth daily.    [provider]  mirabegron ER (MYRBETRIQ) 50 MG TB24 tablet Take 1 tablet (50 mg total) by mouth daily. 04/18/19   Towanda Malkin, MD  nitroGLYCERIN (NITROSTAT) 0.4 MG SL tablet Place 1 tablet (0.4 mg total) under the tongue every 5 (five) minutes as needed for chest pain. Patient not taking: Reported on 05/05/2019 04/29/19 05/29/19  Sidney Ace, MD  ondansetron (ZOFRAN ODT) 4 MG disintegrating tablet Take 1 tablet (4 mg total) by mouth every 8 (eight) hours as needed for nausea or vomiting. 04/18/19   Towanda Malkin, MD  oxyCODONE-acetaminophen (PERCOCET) 5-325 MG tablet Take 1 tablet by mouth every 12 (twelve) hours as needed for severe pain. 04/19/19 05/19/19  Gillis Santa, MD  pantoprazole (PROTONIX) 40 MG tablet Take 1 tablet (40 mg total) by mouth daily. 07/21/18   Hubbard Hartshorn, [name]  polyethylene glycol (  MIRALAX / GLYCOLAX)  packet Take 17 g by mouth daily as needed for mild constipation. 07/30/16   Lauree Chandler, NP  potassium chloride SA (KLOR-CON M20) 20 MEQ tablet Take 2 tablets (40 mEq total) by mouth daily. 03/01/19   Towanda Malkin, MD  predniSONE (DELTASONE) 20 MG tablet Take 20 mg by mouth daily.  01/26/19   [provider]  simvastatin (ZOCOR) 40 MG tablet Take 40 mg by mouth daily.    [provider]  spironolactone (ALDACTONE) 25 MG tablet Take 1 tablet by mouth daily. 01/23/19   [provider]  terconazole (TERAZOL 7) 0.4 % vaginal cream Place 1 applicator vaginally at bedtime as needed (irritation).    [provider]    Physical Exam: Vitals:   05/16/19 1800 05/16/19 1830 05/16/19 1831 05/16/19 1900  BP:      Pulse: (!) 57 62  63  Resp: 20 (!) 22  (!) 22  Temp:   97.9 F (36.6 C)   TempSrc:   Oral   SpO2: 98% 99%  99%  Weight:      Height:         Vitals:   05/16/19 1800 05/16/19 1830 05/16/19 1831 05/16/19 1900  BP:      Pulse: (!) 57 62  63  Resp: 20 (!) 22  (!) 22  Temp:   97.9 F (36.6 C)   TempSrc:   Oral   SpO2: 98% 99%  99%  Weight:      Height:        Constitutional: Alert and awake, oriented x3, not in any acute distress. Eyes: PERLA, EOMI, irises appear normal, anicteric sclera,  ENMT: external ears and nose appear normal, normal hearing             Lips appears normal, oropharynx mucosa, tongue, posterior pharynx appear normal  Neck: neck appears normal, no masses, normal ROM, no thyromegaly, no JVD  CVS: S1-S2 clear, no murmur rubs or gallops,  , no carotid bruits, pedal pulses palpable, No LE edema Respiratory:  clear to auscultation bilaterally, no wheezing, rales or rhonchi. Respiratory effort normal. No accessory muscle use.  Abdomen: soft nontender, nondistended, normal bowel sounds, no hepatosplenomegaly, no hernias Musculoskeletal: : no cyanosis, clubbing , no contractures or atrophy Neuro: Cranial nerves II-XII  intact, sensation, reflexes normal, strength Psych: judgement and insight appear normal, stable mood and affect,  Skin: no rashes or lesions or ulcers, no induration or nodules   Labs on Admission: I have personally reviewed following labs and imaging studies  CBC: Recent Labs  Lab 05/15/19 1734 05/16/19 1718  WBC 6.4 7.0  NEUTROABS  --  4.5  HGB 13.2 12.0  HCT 42.6 37.6  MCV 95.5 93.8  PLT 304 283   Basic Metabolic Panel: Recent Labs  Lab 05/15/19 1734 05/16/19 1718  NA 140 137  K 3.9 3.5  CL 108 104  CO2 26 24  GLUCOSE 248* 171*  BUN 21* 18  CREATININE 0.95 0.70  CALCIUM 9.0 8.7*   GFR: Estimated Creatinine Clearance: 100 mL/min (by C-G formula based on SCr of 0.7 mg/dL). Liver Function Tests: Recent Labs  Lab 05/16/19 1718  AST 19  ALT 28  ALKPHOS 58  BILITOT 0.3  PROT 7.2  ALBUMIN 3.5   Recent Labs  Lab 05/16/19 1718  LIPASE 45   No results for input(s): AMMONIA in the last 168 hours. Coagulation Profile: No results for input(s): INR, PROTIME in the last 168 hours. Cardiac  Enzymes: No results for input(s): CKTOTAL, CKMB, CKMBINDEX, TROPONINI in the last 168 hours. BNP (last 3 results) No results for input(s): PROBNP in the last 8760 hours. HbA1C: No results for input(s): HGBA1C in the last 72 hours. CBG: Recent Labs  Lab 05/15/19 1823 05/16/19 1654 05/16/19 1720 05/16/19 1819 05/16/19 1914  GLUCAP 213* 30* 82 40* 83   Lipid Profile: No results for input(s): CHOL, HDL, LDLCALC, TRIG, CHOLHDL, LDLDIRECT in the last 72 hours. Thyroid Function Tests: No results for input(s): TSH, T4TOTAL, FREET4, T3FREE, THYROIDAB in the last 72 hours. Anemia Panel: No results for input(s): VITAMINB12, FOLATE, FERRITIN, TIBC, IRON, RETICCTPCT in the last 72 hours. Urine analysis:    Component Value Date/Time   COLORURINE YELLOW (A) 12/05/2018 1310   APPEARANCEUR HAZY (A) 12/05/2018 1310   LABSPEC 1.021 12/05/2018 1310   PHURINE 5.0 12/05/2018 1310    GLUCOSEU 50 (A) 12/05/2018 1310   HGBUR NEGATIVE 12/05/2018 1310   HGBUR negative 09/30/2006 1400   BILIRUBINUR NEGATIVE 12/05/2018 1310   BILIRUBINUR neg 11/11/2016 1214   KETONESUR NEGATIVE 12/05/2018 1310   PROTEINUR 30 (A) 12/05/2018 1310   UROBILINOGEN 0.2 11/11/2016 1214   UROBILINOGEN 0.2 06/18/2014 0028   NITRITE NEGATIVE 12/05/2018 1310   LEUKOCYTESUR LARGE (A) 12/05/2018 1310    Radiological Exams on Admission: DG Chest 2 View  Result Date: 05/15/2019 CLINICAL DATA:  Hypoglycemia. EXAM: CHEST - 2 VIEW COMPARISON:  Chest radiograph 02/27/2019 FINDINGS: Stable cardiomediastinal contours. The lungs are clear. No pneumothorax or pleural effusion. No acute finding in the visualized skeleton. IMPRESSION: No acute cardiopulmonary finding. Electronically Signed   By: Audie Pinto M.D.   On: 05/15/2019 18:09   DG Humerus Left  Result Date: 05/15/2019 CLINICAL DATA:  Left arm pain after possible fall EXAM: LEFT HUMERUS - 2+ VIEW COMPARISON:  None. FINDINGS: There is no evidence of fracture or other focal bone lesions. Soft tissues are unremarkable. IMPRESSION: Neg negative radiographs of the left humerus. Electronically Signed   By: Audie Pinto M.D.   On: 05/15/2019 19:12    EKG: Independently reviewed.   Assessment/Plan Principal Problem:   Hypoglycemia associated with type 2 diabetes mellitus ([place]) -Patient with 2 days of hypoglycemia not responding to initial treatment in the emergency room outpatient management -Etiology of persistent hypoglycemia undetermined at this time -Patient on high-dose insulin as well as diraglutide -Hold all hypoglycemic agents -Continue D10 -Serial blood glucose -   Essential hypertension -Continue home antihypertensives    Seizure disorder ([place]) -Continue Keppra    History of CVA (cerebrovascular accident) -Continue statin and antiplatelets    COPD (chronic obstructive pulmonary disease) ([place]) -DuoNebs as needed    CAD  (coronary artery disease) -No complaints of chest pain -Troponin negative and EKG shows no acute ST-T wave changes  OSA on CPAP -Respiratory consult for CPAP until patient gets her home unit    DVT prophylaxis: Lovenox  Code Status: full code  Family Communication:  none  Disposition Plan: Back to previous home environment Consults called: none  Status:obs    Athena Masse MD [facility]     05/16/2019, 7:27 PM

## 2019-05-17 ENCOUNTER — Telehealth: Payer: Self-pay

## 2019-05-17 DIAGNOSIS — I110 Hypertensive heart disease with heart failure: Secondary | ICD-10-CM | POA: Diagnosis present

## 2019-05-17 DIAGNOSIS — I2510 Atherosclerotic heart disease of native coronary artery without angina pectoris: Secondary | ICD-10-CM | POA: Diagnosis present

## 2019-05-17 DIAGNOSIS — G890 Central pain syndrome: Secondary | ICD-10-CM | POA: Diagnosis present

## 2019-05-17 DIAGNOSIS — J449 Chronic obstructive pulmonary disease, unspecified: Secondary | ICD-10-CM | POA: Diagnosis present

## 2019-05-17 DIAGNOSIS — E162 Hypoglycemia, unspecified: Secondary | ICD-10-CM | POA: Diagnosis present

## 2019-05-17 DIAGNOSIS — Z6838 Body mass index (BMI) 38.0-38.9, adult: Secondary | ICD-10-CM | POA: Diagnosis not present

## 2019-05-17 DIAGNOSIS — Z20822 Contact with and (suspected) exposure to covid-19: Secondary | ICD-10-CM | POA: Diagnosis present

## 2019-05-17 DIAGNOSIS — G40A09 Absence epileptic syndrome, not intractable, without status epilepticus: Secondary | ICD-10-CM | POA: Diagnosis present

## 2019-05-17 DIAGNOSIS — E1140 Type 2 diabetes mellitus with diabetic neuropathy, unspecified: Secondary | ICD-10-CM | POA: Diagnosis present

## 2019-05-17 DIAGNOSIS — I252 Old myocardial infarction: Secondary | ICD-10-CM | POA: Diagnosis not present

## 2019-05-17 DIAGNOSIS — M1990 Unspecified osteoarthritis, unspecified site: Secondary | ICD-10-CM | POA: Diagnosis present

## 2019-05-17 DIAGNOSIS — E11649 Type 2 diabetes mellitus with hypoglycemia without coma: Secondary | ICD-10-CM | POA: Diagnosis present

## 2019-05-17 DIAGNOSIS — E785 Hyperlipidemia, unspecified: Secondary | ICD-10-CM | POA: Diagnosis present

## 2019-05-17 DIAGNOSIS — H409 Unspecified glaucoma: Secondary | ICD-10-CM | POA: Diagnosis present

## 2019-05-17 DIAGNOSIS — K219 Gastro-esophageal reflux disease without esophagitis: Secondary | ICD-10-CM | POA: Diagnosis present

## 2019-05-17 DIAGNOSIS — H548 Legal blindness, as defined in USA: Secondary | ICD-10-CM | POA: Diagnosis present

## 2019-05-17 DIAGNOSIS — E11319 Type 2 diabetes mellitus with unspecified diabetic retinopathy without macular edema: Secondary | ICD-10-CM | POA: Diagnosis present

## 2019-05-17 DIAGNOSIS — G43909 Migraine, unspecified, not intractable, without status migrainosus: Secondary | ICD-10-CM | POA: Diagnosis present

## 2019-05-17 DIAGNOSIS — G4733 Obstructive sleep apnea (adult) (pediatric): Secondary | ICD-10-CM | POA: Diagnosis present

## 2019-05-17 DIAGNOSIS — I5032 Chronic diastolic (congestive) heart failure: Secondary | ICD-10-CM | POA: Diagnosis present

## 2019-05-17 DIAGNOSIS — R4182 Altered mental status, unspecified: Secondary | ICD-10-CM | POA: Diagnosis present

## 2019-05-17 DIAGNOSIS — Z7902 Long term (current) use of antithrombotics/antiplatelets: Secondary | ICD-10-CM | POA: Diagnosis not present

## 2019-05-17 DIAGNOSIS — T383X5A Adverse effect of insulin and oral hypoglycemic [antidiabetic] drugs, initial encounter: Secondary | ICD-10-CM | POA: Diagnosis present

## 2019-05-17 DIAGNOSIS — E739 Lactose intolerance, unspecified: Secondary | ICD-10-CM | POA: Diagnosis present

## 2019-05-17 DIAGNOSIS — I69354 Hemiplegia and hemiparesis following cerebral infarction affecting left non-dominant side: Secondary | ICD-10-CM | POA: Diagnosis not present

## 2019-05-17 LAB — GLUCOSE, CAPILLARY
Glucose-Capillary: 101 mg/dL — ABNORMAL HIGH (ref 70–99)
Glucose-Capillary: 111 mg/dL — ABNORMAL HIGH (ref 70–99)
Glucose-Capillary: 113 mg/dL — ABNORMAL HIGH (ref 70–99)
Glucose-Capillary: 124 mg/dL — ABNORMAL HIGH (ref 70–99)
Glucose-Capillary: 127 mg/dL — ABNORMAL HIGH (ref 70–99)
Glucose-Capillary: 129 mg/dL — ABNORMAL HIGH (ref 70–99)
Glucose-Capillary: 148 mg/dL — ABNORMAL HIGH (ref 70–99)
Glucose-Capillary: 156 mg/dL — ABNORMAL HIGH (ref 70–99)
Glucose-Capillary: 175 mg/dL — ABNORMAL HIGH (ref 70–99)
Glucose-Capillary: 64 mg/dL — ABNORMAL LOW (ref 70–99)
Glucose-Capillary: 68 mg/dL — ABNORMAL LOW (ref 70–99)
Glucose-Capillary: 75 mg/dL (ref 70–99)
Glucose-Capillary: 85 mg/dL (ref 70–99)
Glucose-Capillary: 86 mg/dL (ref 70–99)
Glucose-Capillary: 88 mg/dL (ref 70–99)
Glucose-Capillary: 96 mg/dL (ref 70–99)
Glucose-Capillary: 97 mg/dL (ref 70–99)

## 2019-05-17 LAB — BASIC METABOLIC PANEL
Anion gap: 6 (ref 5–15)
BUN: 12 mg/dL (ref 6–20)
CO2: 28 mmol/L (ref 22–32)
Calcium: 9.1 mg/dL (ref 8.9–10.3)
Chloride: 107 mmol/L (ref 98–111)
Creatinine, Ser: 0.73 mg/dL (ref 0.44–1.00)
GFR calc Af Amer: >60 mL/min (ref >60)
GFR calc non Af Amer: >60 mL/min (ref >60)
Glucose, Bld: 103 mg/dL — ABNORMAL HIGH (ref 70–99)
Potassium: 3.8 mmol/L (ref 3.5–5.1)
Sodium: 141 mmol/L (ref 135–145)

## 2019-05-17 MED ORDER — BUDESONIDE 0.5 MG/2ML IN SUSP
0.5000 mg | Freq: Every day | RESPIRATORY_TRACT | Status: DC
  Administered 2019-05-17 – 2019-05-20 (×4): 0.5 mg via RESPIRATORY_TRACT
  Filled 2019-05-17 (×4): qty 2

## 2019-05-17 MED ORDER — CYCLOSPORINE 0.05 % OP EMUL
2.0000 [drp] | Freq: Two times a day (BID) | OPHTHALMIC | Status: DC
  Administered 2019-05-17 – 2019-05-21 (×8): 2 [drp] via OPHTHALMIC
  Filled 2019-05-17 (×9): qty 1

## 2019-05-17 MED ORDER — MIRABEGRON ER 50 MG PO TB24
50.0000 mg | ORAL_TABLET | Freq: Every day | ORAL | Status: DC
  Administered 2019-05-18 – 2019-05-21 (×4): 50 mg via ORAL
  Filled 2019-05-17 (×5): qty 1

## 2019-05-17 MED ORDER — GABAPENTIN 300 MG PO CAPS
800.0000 mg | ORAL_CAPSULE | Freq: Every day | ORAL | Status: DC
  Administered 2019-05-17 – 2019-05-20 (×4): 800 mg via ORAL
  Filled 2019-05-17 (×4): qty 2

## 2019-05-17 MED ORDER — POLYETHYLENE GLYCOL 3350 17 G PO PACK: 17.0000 g | PACK | Freq: Every day | ORAL | Status: DC | PRN

## 2019-05-17 MED ORDER — FUROSEMIDE 40 MG PO TABS
40.0000 mg | ORAL_TABLET | Freq: Once | ORAL | Status: AC
  Administered 2019-05-17: 17:00:00 40 mg via ORAL
  Filled 2019-05-17: qty 1

## 2019-05-17 MED ORDER — GABAPENTIN 300 MG PO CAPS
400.0000 mg | ORAL_CAPSULE | Freq: Two times a day (BID) | ORAL | Status: DC
  Administered 2019-05-17 – 2019-05-21 (×8): 400 mg via ORAL
  Filled 2019-05-17 (×8): qty 1

## 2019-05-17 MED ORDER — OXYCODONE-ACETAMINOPHEN 5-325 MG PO TABS
1.0000 | ORAL_TABLET | Freq: Two times a day (BID) | ORAL | Status: DC | PRN
  Administered 2019-05-18 – 2019-05-21 (×5): 1 via ORAL
  Filled 2019-05-17 (×6): qty 1

## 2019-05-17 MED ORDER — LOSARTAN POTASSIUM 50 MG PO TABS
50.0000 mg | ORAL_TABLET | Freq: Every day | ORAL | Status: DC
  Administered 2019-05-17 – 2019-05-21 (×5): 50 mg via ORAL
  Filled 2019-05-17 (×4): qty 1

## 2019-05-17 MED ORDER — CLOPIDOGREL BISULFATE 75 MG PO TABS
75.0000 mg | ORAL_TABLET | Freq: Every day | ORAL | Status: DC
  Administered 2019-05-17 – 2019-05-21 (×5): 75 mg via ORAL
  Filled 2019-05-17 (×4): qty 1

## 2019-05-17 MED ORDER — SPIRONOLACTONE 25 MG PO TABS
25.0000 mg | ORAL_TABLET | Freq: Every day | ORAL | Status: DC
  Administered 2019-05-17 – 2019-05-21 (×5): 25 mg via ORAL
  Filled 2019-05-17 (×4): qty 1

## 2019-05-17 MED ORDER — LEVETIRACETAM 750 MG PO TABS
750.0000 mg | ORAL_TABLET | Freq: Every day | ORAL | Status: DC
  Administered 2019-05-17 – 2019-05-20 (×4): 750 mg via ORAL
  Filled 2019-05-17 (×5): qty 1

## 2019-05-17 MED ORDER — BUDESONIDE 0.5 MG/2ML IN SUSP: 0.5000 mg | Freq: Every day | RESPIRATORY_TRACT | Status: DC

## 2019-05-17 MED ORDER — DEXTROSE 50 % IV SOLN
INTRAVENOUS | Status: AC
  Administered 2019-05-17: 17:00:00 50 mL
  Filled 2019-05-17: qty 50

## 2019-05-17 MED ORDER — LEVETIRACETAM 500 MG PO TABS
500.0000 mg | ORAL_TABLET | Freq: Every morning | ORAL | Status: DC
  Administered 2019-05-18 – 2019-05-21 (×4): 500 mg via ORAL
  Filled 2019-05-17 (×4): qty 1

## 2019-05-17 NOTE — Progress Notes (Signed)
PROGRESS NOTE    [name]  OMV:[id number] DOB: May 09, 1965 DOA: 05/16/2019 PCP: Hubbard Hartshorn, [name]    Assessment & Plan:   Principal Problem:   Hypoglycemia associated with type 2 diabetes mellitus ([place]) Active Problems:   Essential hypertension   Seizure disorder ([place])   History of CVA (cerebrovascular accident)   COPD (chronic obstructive pulmonary disease) ([place])   CAD (coronary artery disease)   Hypoglycemia    [name] is a 54 y.o. female with medical history significant for COPD, diabetes, OSA on CPAP, diastolic heart failure and GERD who presents to the emergency room for the second time in 2 days for evaluation of altered mental status.  On her first visit to the emergency room yesterday, blood sugar was in the 30s, she was treated in the emergency room and discharge.  Today she went to a doctor's appointment and was found to be confused and unsteady on her feet and her blood sugar was 35.  EMS reported blood sugar of 35 improving to 90 with 400 mL of D10 to which she responded and became more alert.     Hypoglycemia  Insulin-dependent type 2 diabetes mellitus ([place]) -Patient with 2 days of hypoglycemia not responding to initial treatment in the emergency room outpatient management -Etiology of persistent hypoglycemia likely due to weekly diraglutide (last received Sunday) -Patient on high-dose insulin as well  PLAN:  -Hold all hypoglycemic agents -Continue D10@100  -q4h BG checks -Regular diet    Essential hypertension --BP grossly elevated 2/2 home BP meds not ordered -resume home coreg, losartan, aldactone --PO Lasix 40 mg x1 today    Seizure disorder ([place]) -Continue home Keppra    History of CVA (cerebrovascular accident) -Continue statin and plavix    COPD (chronic obstructive pulmonary disease) ([place]) --continue home pulmicort neb -DuoNebs as needed    CAD (coronary artery disease) -No complaints of chest pain -Troponin  negative and EKG shows no acute ST-T wave changes  OSA on CPAP -Respiratory consult for CPAP until patient gets her home unit   DVT prophylaxis: Lovenox SQ Code Status: Full code  Family Communication:  Status is: inpatient Dispo:   The patient is from: home Anticipated d/c is to: home Anticipated d/c date is: tomorrow Patient currently is not medically stable to d/c due to: still needing D10 infusion continuously to keep BG from dropping.   Subjective and Interval History:  Pt continued to need D10 gtt to keep from dropping BG, otherwise, pt has been feeling better and eating well.  No fever, dyspnea, chest pain, abdominal pain, N/V/D.   Objective: Vitals:   05/16/19 1932 05/16/19 2009 05/17/19 0032 05/17/19 1553  BP:  (!) 196/101 (!) 146/85 (!) 193/98  Pulse: 93 70 81   Resp: (!) 24 16 18 18   Temp:  98.1 F (36.7 C) 98.2 F (36.8 C) 98.7 F (37.1 C)  TempSrc:  Oral Oral Oral  SpO2: 98% 100% 95% 97%  Weight:  107.7 kg    Height:  5' 6"  (1.676 m)      Intake/Output Summary (Last 24 hours) at 05/17/2019 1932 Last data filed at 05/17/2019 1700 Gross per 24 hour  Intake 1700 ml  Output --  Net 1700 ml   Filed Weights   05/16/19 1644 05/16/19 2009  Weight: 108 kg 107.7 kg    Examination:   Constitutional: NAD, AAOx3 HEENT: conjunctivae and lids normal, EOMI CV: No cyanosis.   RESP: normal respiratory effort  Extremities: No effusions, edema, or  tenderness in BLE MSK: normal ROM and strength, no joint enlargement or tenderness of both UE and LE SKIN: warm, dry and intact Neuro: II - XII grossly intact.  Sensation intact Psych: Normal mood and affect.  Appropriate judgement and reason   Data Reviewed: I have personally reviewed following labs and imaging studies  CBC: Recent Labs  Lab 05/15/19 1734 05/16/19 1718 05/16/19 1916  WBC 6.4 7.0 6.8  NEUTROABS  --  4.5  --   HGB 13.2 12.0 12.5  HCT 42.6 37.6 40.0  MCV 95.5 93.8 95.2  PLT 304 277 518    Basic Metabolic Panel: Recent Labs  Lab 05/15/19 1734 05/16/19 1718 05/16/19 1916 05/17/19 0505  NA 140 137  --  141  K 3.9 3.5  --  3.8  CL 108 104  --  107  CO2 26 24  --  28  GLUCOSE 248* 171*  --  103*  BUN 21* 18  --  12  CREATININE 0.95 0.70 0.71 0.73  CALCIUM 9.0 8.7*  --  9.1   GFR: Estimated Creatinine Clearance: 99.9 mL/min (by C-G formula based on SCr of 0.73 mg/dL). Liver Function Tests: Recent Labs  Lab 05/16/19 1718  AST 19  ALT 28  ALKPHOS 58  BILITOT 0.3  PROT 7.2  ALBUMIN 3.5   Recent Labs  Lab 05/16/19 1718  LIPASE 45   No results for input(s): AMMONIA in the last 168 hours. Coagulation Profile: No results for input(s): INR, PROTIME in the last 168 hours. Cardiac Enzymes: No results for input(s): CKTOTAL, CKMB, CKMBINDEX, TROPONINI in the last 168 hours. BNP (last 3 results) No results for input(s): PROBNP in the last 8760 hours. HbA1C: No results for input(s): HGBA1C in the last 72 hours. CBG: Recent Labs  Lab 05/17/19 1303 05/17/19 1435 05/17/19 1711 05/17/19 1804 05/17/19 1844  GLUCAP 101* 85 64* 175* 156*   Lipid Profile: No results for input(s): CHOL, HDL, LDLCALC, TRIG, CHOLHDL, LDLDIRECT in the last 72 hours. Thyroid Function Tests: No results for input(s): TSH, T4TOTAL, FREET4, T3FREE, THYROIDAB in the last 72 hours. Anemia Panel: No results for input(s): VITAMINB12, FOLATE, FERRITIN, TIBC, IRON, RETICCTPCT in the last 72 hours. Sepsis Labs: No results for input(s): PROCALCITON, LATICACIDVEN in the last 168 hours.  Recent Results (from the past 240 hour(s))  Respiratory Panel by RT PCR (Flu A&B, Covid) - Nasopharyngeal Swab     Status: None   Collection Time: 05/16/19  5:22 PM   Specimen: Nasopharyngeal Swab  Result Value Ref Range Status   SARS Coronavirus 2 by RT PCR NEGATIVE NEGATIVE Final    Comment: (NOTE) SARS-CoV-2 target nucleic acids are NOT DETECTED. The SARS-CoV-2 RNA is generally detectable in upper  respiratoy specimens during the acute phase of infection. The lowest concentration of SARS-CoV-2 viral copies this assay can detect is 131 copies/mL. A negative result does not preclude SARS-Cov-2 infection and should not be used as the sole basis for treatment or other patient management decisions. A negative result may occur with  improper specimen collection/handling, submission of specimen other than nasopharyngeal swab, presence of viral mutation(s) within the areas targeted by this assay, and inadequate number of viral copies (<131 copies/mL). A negative result must be combined with clinical observations, patient history, and epidemiological information. The expected result is Negative. Fact Sheet for Patients:  PinkCheek.be Fact Sheet for Healthcare Providers:  GravelBags.it This test is not yet ap proved or cleared by the [place] and  has been authorized for  detection and/or diagnosis of SARS-CoV-2 by FDA under an Emergency Use Authorization (EUA). This EUA will remain  in effect (meaning this test can be used) for the duration of the COVID-19 declaration under Section 564(b)(1) of the Act, 21 U.S.C. section 360bbb-3(b)(1), unless the authorization is terminated or revoked sooner.    Influenza A by PCR NEGATIVE NEGATIVE Final   Influenza B by PCR NEGATIVE NEGATIVE Final    Comment: (NOTE) The Xpert Xpress SARS-CoV-2/FLU/RSV assay is intended as an aid in  the diagnosis of influenza from Nasopharyngeal swab specimens and  should not be used as a sole basis for treatment. Nasal washings and  aspirates are unacceptable for Xpert Xpress SARS-CoV-2/FLU/RSV  testing. Fact Sheet for Patients: PinkCheek.be Fact Sheet for Healthcare Providers: GravelBags.it This test is not yet approved or cleared by the [place] FDA and  has been authorized for  detection and/or diagnosis of SARS-CoV-2 by  FDA under an Emergency Use Authorization (EUA). This EUA will remain  in effect (meaning this test can be used) for the duration of the  Covid-19 declaration under Section 564(b)(1) of the Act, 21  U.S.C. section 360bbb-3(b)(1), unless the authorization is  terminated or revoked. Performed at [facility], [address] [place], [place] [address]       Radiology Studies: No results found.   Scheduled Meds: . budesonide  0.5 mg Inhalation Daily  . carvedilol  12.5 mg Oral BID PC  . clopidogrel  75 mg Oral Daily  . cycloSPORINE  2 drop Both Eyes BID  . enoxaparin (LOVENOX) injection  40 mg Subcutaneous Q24H  . ezetimibe  10 mg Oral Daily  . gabapentin  400 mg Oral BID  . gabapentin  800 mg Oral QHS  . [START ON 05/18/2019] levETIRAcetam  500 mg Oral q morning - 10a  . levETIRAcetam  750 mg Oral QHS  . losartan  50 mg Oral Daily  . mirabegron ER  50 mg Oral Daily  . pantoprazole  40 mg Oral Daily  . simvastatin  40 mg Oral QHS  . spironolactone  25 mg Oral Daily   Continuous Infusions: . dextrose 100 mL/hr at 05/17/19 1655     LOS: 0 days     Enzo Bi, MD [facility] If 7PM-7AM, please contact night-coverage 05/17/2019, 7:32 PM

## 2019-05-18 LAB — GLUCOSE, CAPILLARY
Glucose-Capillary: 92 mg/dL (ref 70–99)
Glucose-Capillary: 103 mg/dL — ABNORMAL HIGH (ref 70–99)
Glucose-Capillary: 126 mg/dL — ABNORMAL HIGH (ref 70–99)
Glucose-Capillary: 115 mg/dL — ABNORMAL HIGH (ref 70–99)
Glucose-Capillary: 80 mg/dL (ref 70–99)
Glucose-Capillary: 96 mg/dL (ref 70–99)
Glucose-Capillary: 160 mg/dL — ABNORMAL HIGH (ref 70–99)
Glucose-Capillary: 187 mg/dL — ABNORMAL HIGH (ref 70–99)

## 2019-05-18 LAB — BASIC METABOLIC PANEL
Anion gap: 7 (ref 5–15)
BUN: 17 mg/dL (ref 6–20)
CO2: 28 mmol/L (ref 22–32)
Calcium: 8.9 mg/dL (ref 8.9–10.3)
Chloride: 105 mmol/L (ref 98–111)
Creatinine, Ser: 1.02 mg/dL — ABNORMAL HIGH (ref 0.44–1.00)
GFR calc Af Amer: >60 mL/min (ref >60)
GFR calc non Af Amer: >60 mL/min (ref >60)
Glucose, Bld: 123 mg/dL — ABNORMAL HIGH (ref 70–99)
Potassium: 3.8 mmol/L (ref 3.5–5.1)
Sodium: 140 mmol/L (ref 135–145)

## 2019-05-18 LAB — CBC
HCT: 40.4 % (ref 36.0–46.0)
Hemoglobin: 12.6 g/dL (ref 12.0–15.0)
MCH: 29.7 pg (ref 26.0–34.0)
MCHC: 31.2 g/dL (ref 30.0–36.0)
MCV: 95.3 fL (ref 80.0–100.0)
Platelets: 290 10*3/uL (ref 150–400)
RBC: 4.24 MIL/uL (ref 3.87–5.11)
RDW: 13.6 % (ref 11.5–15.5)
WBC: 5.9 10*3/uL (ref 4.0–10.5)
nRBC: 0 % (ref 0.0–0.2)

## 2019-05-18 LAB — MAGNESIUM: Magnesium: 2.1 mg/dL (ref 1.7–2.4)

## 2019-05-18 MED ORDER — POLYETHYLENE GLYCOL 3350 17 G PO PACK
34.0000 g | PACK | Freq: Two times a day (BID) | ORAL | Status: DC
  Administered 2019-05-18 – 2019-05-20 (×4): 34 g via ORAL
  Filled 2019-05-18 (×6): qty 2

## 2019-05-18 NOTE — Progress Notes (Signed)
Ch visited with Pt as part of regular rounding. Pt explained to Ch incidents of being brought into the ER after extremely low blood sugars. Pt said that she has been getting low sugars even right after eating. Pt requested prayer for sugar to be normalized. Pt let Ch know about planning trampoline park plans when incident happened. Bland prayed with Pt; Pt tearfully joined in during prayer. Ch let Pt know about chaplain availability before leaving. Pt grateful for visit.

## 2019-05-18 NOTE — Progress Notes (Signed)
PROGRESS NOTE    [name]  SVX:[id number] DOB: 1965-06-22 DOA: 05/16/2019 PCP: Hubbard Hartshorn, [name]    Assessment & Plan:   Principal Problem:   Hypoglycemia associated with type 2 diabetes mellitus ([place]) Active Problems:   Essential hypertension   Seizure disorder ([place])   History of CVA (cerebrovascular accident)   COPD (chronic obstructive pulmonary disease) ([place])   CAD (coronary artery disease)   Hypoglycemia    [name] is a 54 y.o. female with medical history significant for COPD, diabetes, OSA on CPAP, diastolic heart failure and GERD who presents to the emergency room for the second time in 2 days for evaluation of altered mental status.  On her first visit to the emergency room yesterday, blood sugar was in the 30s, she was treated in the emergency room and discharge.  Today she went to a doctor's appointment and was found to be confused and unsteady on her feet and her blood sugar was 35.  EMS reported blood sugar of 35 improving to 90 with 400 mL of D10 to which she responded and became more alert.     Hypoglycemia  Insulin-dependent type 2 diabetes mellitus ([name]) -Patient with 2 days of hypoglycemia not responding to initial treatment in the emergency room outpatient management -Etiology of persistent hypoglycemia likely due to weekly diraglutide (last received Sunday) -Patient on high-dose insulin as well  PLAN:  -Hold all hypoglycemic agents --continue D10 at increased 150 ml/hr to maintain BG >100, since pt doesn't tolerate BG in 80's. -q4h BG checks -Regular diet --Messaged pt's outpatient endocrin provider NP Pasty Arch at [place], waiting to hear back    Essential hypertension --BP grossly elevated 2/2 home BP meds not ordered on admission. --continue home coreg, losartan, aldactone    Seizure disorder ([place]) -Continue home Keppra    History of CVA (cerebrovascular accident) -Continue statin and plavix    COPD (chronic  obstructive pulmonary disease) ([place]) --continue home pulmicort neb -DuoNebs as needed    CAD (coronary artery disease) -No complaints of chest pain -Troponin negative and EKG shows no acute ST-T wave changes  OSA on CPAP -Respiratory consult for CPAP until patient gets her home unit   DVT prophylaxis: Lovenox SQ Code Status: Full code  Family Communication: Daughters updated at bedside today Status is: inpatient Dispo:   The patient is from: home Anticipated d/c is to: home Anticipated d/c date is: Unclear when Patient currently is not medically stable to d/c due to: still needing D10 infusion continuously to keep BG from dropping.   Subjective and Interval History:  Pt felt unwell when BG were in the 80's, and requested q2h BG checks overnight.  D10 increased to 150 ml/hr to maintain BG >100.  No fever, dyspnea, chest pain, abdominal pain, N/V/D, dysuria.     Objective: Vitals:   05/17/19 1553 05/17/19 1957 05/17/19 2339 05/18/19 1525  BP: (!) 193/98  134/87 136/80  Pulse:   85 89  Resp: 18  17 18   Temp: 98.7 F (37.1 C)  97.8 F (36.6 C) 98.6 F (37 C)  TempSrc: Oral  Axillary Oral  SpO2: 97% 97% 99% 98%  Weight:      Height:        Intake/Output Summary (Last 24 hours) at 05/18/2019 1806 Last data filed at 05/18/2019 1358 Gross per 24 hour  Intake 480 ml  Output -  Net 480 ml   Filed Weights   05/16/19 1644 05/16/19 2009  Weight: 108 kg 107.7 kg  Examination:   Constitutional: NAD, AAOx3 HEENT: conjunctivae and lids normal, EOMI CV: RRR no M,R,G. Distal pulses +2.  No cyanosis.   RESP: CTA B/L, normal respiratory effort  GI: +BS, NTND Extremities: No effusions, edema, or tenderness in BLE SKIN: warm, dry and intact Neuro: II - XII grossly intact.  Sensation intact Psych: Anxious mood and affect.    Data Reviewed: I have personally reviewed following labs and imaging studies  CBC: Recent Labs  Lab 05/15/19 1734 05/16/19 1718 05/16/19 1916  05/18/19 0407  WBC 6.4 7.0 6.8 5.9  NEUTROABS  --  4.5  --   --   HGB 13.2 12.0 12.5 12.6  HCT 42.6 37.6 40.0 40.4  MCV 95.5 93.8 95.2 95.3  PLT 304 277 287 768   Basic Metabolic Panel: Recent Labs  Lab 05/15/19 1734 05/16/19 1718 05/16/19 1916 05/17/19 0505 05/18/19 0407  NA 140 137  --  141 140  K 3.9 3.5  --  3.8 3.8  CL 108 104  --  107 105  CO2 26 24  --  28 28  GLUCOSE 248* 171*  --  103* 123*  BUN 21* 18  --  12 17  CREATININE 0.95 0.70 0.71 0.73 1.02*  CALCIUM 9.0 8.7*  --  9.1 8.9  MG  --   --   --   --  2.1   GFR: Estimated Creatinine Clearance: 78.3 mL/min (A) (by C-G formula based on SCr of 1.02 mg/dL (H)). Liver Function Tests: Recent Labs  Lab 05/16/19 1718  AST 19  ALT 28  ALKPHOS 58  BILITOT 0.3  PROT 7.2  ALBUMIN 3.5   Recent Labs  Lab 05/16/19 1718  LIPASE 45   No results for input(s): AMMONIA in the last 168 hours. Coagulation Profile: No results for input(s): INR, PROTIME in the last 168 hours. Cardiac Enzymes: No results for input(s): CKTOTAL, CKMB, CKMBINDEX, TROPONINI in the last 168 hours. BNP (last 3 results) No results for input(s): PROBNP in the last 8760 hours. HbA1C: No results for input(s): HGBA1C in the last 72 hours. CBG: Recent Labs  Lab 05/18/19 0440 05/18/19 0649 05/18/19 0819 05/18/19 1132 05/18/19 1631  GLUCAP 126* 115* 80 96 160*   Lipid Profile: No results for input(s): CHOL, HDL, LDLCALC, TRIG, CHOLHDL, LDLDIRECT in the last 72 hours. Thyroid Function Tests: No results for input(s): TSH, T4TOTAL, FREET4, T3FREE, THYROIDAB in the last 72 hours. Anemia Panel: No results for input(s): VITAMINB12, FOLATE, FERRITIN, TIBC, IRON, RETICCTPCT in the last 72 hours. Sepsis Labs: No results for input(s): PROCALCITON, LATICACIDVEN in the last 168 hours.  Recent Results (from the past 240 hour(s))  Respiratory Panel by RT PCR (Flu A&B, Covid) - Nasopharyngeal Swab     Status: None   Collection Time: 05/16/19  5:22 PM    Specimen: Nasopharyngeal Swab  Result Value Ref Range Status   SARS Coronavirus 2 by RT PCR NEGATIVE NEGATIVE Final    Comment: (NOTE) SARS-CoV-2 target nucleic acids are NOT DETECTED. The SARS-CoV-2 RNA is generally detectable in upper respiratoy specimens during the acute phase of infection. The lowest concentration of SARS-CoV-2 viral copies this assay can detect is 131 copies/mL. A negative result does not preclude SARS-Cov-2 infection and should not be used as the sole basis for treatment or other patient management decisions. A negative result may occur with  improper specimen collection/handling, submission of specimen other than nasopharyngeal swab, presence of viral mutation(s) within the areas targeted by this assay, and inadequate number  of viral copies (<131 copies/mL). A negative result must be combined with clinical observations, patient history, and epidemiological information. The expected result is Negative. Fact Sheet for Patients:  PinkCheek.be Fact Sheet for Healthcare Providers:  GravelBags.it This test is not yet ap proved or cleared by the [place] FDA and  has been authorized for detection and/or diagnosis of SARS-CoV-2 by FDA under an Emergency Use Authorization (EUA). This EUA will remain  in effect (meaning this test can be used) for the duration of the COVID-19 declaration under Section 564(b)(1) of the Act, 21 U.S.C. section 360bbb-3(b)(1), unless the authorization is terminated or revoked sooner.    Influenza A by PCR NEGATIVE NEGATIVE Final   Influenza B by PCR NEGATIVE NEGATIVE Final    Comment: (NOTE) The Xpert Xpress SARS-CoV-2/FLU/RSV assay is intended as an aid in  the diagnosis of influenza from Nasopharyngeal swab specimens and  should not be used as a sole basis for treatment. Nasal washings and  aspirates are unacceptable for Xpert Xpress SARS-CoV-2/FLU/RSV  testing. Fact  Sheet for Patients: PinkCheek.be Fact Sheet for Healthcare Providers: GravelBags.it This test is not yet approved or cleared by the [place] FDA and  has been authorized for detection and/or diagnosis of SARS-CoV-2 by  FDA under an Emergency Use Authorization (EUA). This EUA will remain  in effect (meaning this test can be used) for the duration of the  Covid-19 declaration under Section 564(b)(1) of the Act, 21  U.S.C. section 360bbb-3(b)(1), unless the authorization is  terminated or revoked. Performed at [facility], [address], [place], [place] [address]       Radiology Studies: No results found.   Scheduled Meds: . budesonide  0.5 mg Inhalation Daily  . carvedilol  12.5 mg Oral BID PC  . clopidogrel  75 mg Oral Daily  . cycloSPORINE  2 drop Both Eyes BID  . enoxaparin (LOVENOX) injection  40 mg Subcutaneous Q24H  . ezetimibe  10 mg Oral Daily  . gabapentin  400 mg Oral BID  . gabapentin  800 mg Oral QHS  . levETIRAcetam  500 mg Oral q morning - 10a  . levETIRAcetam  750 mg Oral QHS  . losartan  50 mg Oral Daily  . mirabegron ER  50 mg Oral Daily  . pantoprazole  40 mg Oral Daily  . polyethylene glycol  34 g Oral BID  . simvastatin  40 mg Oral QHS  . spironolactone  25 mg Oral Daily   Continuous Infusions: . dextrose 150 mL/hr at 05/18/19 1654     LOS: 1 day     Enzo Bi, MD [facility] If 7PM-7AM, please contact night-coverage 05/18/2019, 6:06 PM

## 2019-05-19 LAB — BASIC METABOLIC PANEL
Anion gap: 6 (ref 5–15)
BUN: 19 mg/dL (ref 6–20)
CO2: 28 mmol/L (ref 22–32)
Calcium: 8.7 mg/dL — ABNORMAL LOW (ref 8.9–10.3)
Chloride: 106 mmol/L (ref 98–111)
Creatinine, Ser: 0.97 mg/dL (ref 0.44–1.00)
GFR calc Af Amer: >60 mL/min (ref >60)
GFR calc non Af Amer: >60 mL/min (ref >60)
Glucose, Bld: 176 mg/dL — ABNORMAL HIGH (ref 70–99)
Potassium: 3.8 mmol/L (ref 3.5–5.1)
Sodium: 140 mmol/L (ref 135–145)

## 2019-05-19 LAB — GLUCOSE, CAPILLARY
Glucose-Capillary: 160 mg/dL — ABNORMAL HIGH (ref 70–99)
Glucose-Capillary: 170 mg/dL — ABNORMAL HIGH (ref 70–99)
Glucose-Capillary: 193 mg/dL — ABNORMAL HIGH (ref 70–99)
Glucose-Capillary: 204 mg/dL — ABNORMAL HIGH (ref 70–99)
Glucose-Capillary: 233 mg/dL — ABNORMAL HIGH (ref 70–99)
Glucose-Capillary: 233 mg/dL — ABNORMAL HIGH (ref 70–99)
Glucose-Capillary: 234 mg/dL — ABNORMAL HIGH (ref 70–99)

## 2019-05-19 LAB — CBC
HCT: 37.5 % (ref 36.0–46.0)
Hemoglobin: 12 g/dL (ref 12.0–15.0)
MCH: 29.9 pg (ref 26.0–34.0)
MCHC: 32 g/dL (ref 30.0–36.0)
MCV: 93.5 fL (ref 80.0–100.0)
Platelets: 259 10*3/uL (ref 150–400)
RBC: 4.01 MIL/uL (ref 3.87–5.11)
RDW: 13.7 % (ref 11.5–15.5)
WBC: 6 10*3/uL (ref 4.0–10.5)
nRBC: 0 % (ref 0.0–0.2)

## 2019-05-19 LAB — MAGNESIUM: Magnesium: 2.1 mg/dL (ref 1.7–2.4)

## 2019-05-19 MED ORDER — DUPILUMAB 300 MG/2ML ~~LOC~~ SOSY
300.0000 mg | PREFILLED_SYRINGE | SUBCUTANEOUS | Status: DC
  Administered 2019-05-19: 300 mg via SUBCUTANEOUS
  Filled 2019-05-19: qty 2

## 2019-05-19 NOTE — Progress Notes (Addendum)
Pt consistently c/o being light headed and dizzy. She states this is constant nothing makes worse or better. Non-positional. NP, E. Ouma made aware. No new orders at this time. Will continue to monitor.

## 2019-05-19 NOTE — Care Management Important Message (Signed)
Important Message  Patient Details  Name: [name] MRN: [id number] Date of Birth: Sep 04, 1965   Medicare Important Message Given:  Yes     Juliann Pulse A Sharai [place] 05/19/2019, 11:29 AM

## 2019-05-19 NOTE — Progress Notes (Signed)
PROGRESS NOTE    Keyonia Gluth  OFB:[id number] DOB: [birth year] DOA: 05/16/2019 PCP: Hubbard Hartshorn, [name]    Assessment & Plan:   Principal Problem:   Hypoglycemia associated with type 2 diabetes mellitus ([place]) Active Problems:   Essential hypertension   Seizure disorder ([place])   History of CVA (cerebrovascular accident)   COPD (chronic obstructive pulmonary disease) ([place])   CAD (coronary artery disease)   Hypoglycemia    Emely Chrishonda Hesch is a 54 y.o. female with medical history significant for COPD, diabetes, OSA on CPAP, diastolic heart failure and GERD who presents to the emergency room for the second time in 2 days for evaluation of altered mental status.  On her first visit to the emergency room yesterday, blood sugar was in the 30s, she was treated in the emergency room and discharge.  Today she went to a doctor's appointment and was found to be confused and unsteady on her feet and her blood sugar was 35.  EMS reported blood sugar of 35 improving to 90 with 400 mL of D10 to which she responded and became more alert.     Hypoglycemia, improved Insulin-dependent type 2 diabetes mellitus ([place]) -Patient with 2 days of hypoglycemia not responding to initial treatment in the emergency room outpatient management -Etiology of persistent hypoglycemia likely due to weekly diraglutide (last received Sunday) -Patient on high-dose insulin as well  PLAN:  -Hold all hypoglycemic agents --continue D10 gtt, now decreased to 25 ml/hr, maintain BG >100, since pt doesn't tolerate BG in 80's. -q4h BG checks -Regular diet --Messaged pt's outpatient endocrin provider NP Pasty Arch at [place], waiting to hear back    Essential hypertension --BP grossly elevated 2/2 home BP meds not ordered on admission. --continue home coreg, losartan, aldactone    Seizure disorder ([place]) -Continue home Keppra    History of CVA (cerebrovascular accident) -Continue statin and plavix    COPD  (chronic obstructive pulmonary disease) ([place]) --continue home pulmicort neb -DuoNebs as needed    CAD (coronary artery disease) -No complaints of chest pain -Troponin negative and EKG shows no acute ST-T wave changes  OSA on CPAP -Respiratory consult for CPAP until patient gets her home unit   DVT prophylaxis: Lovenox SQ Code Status: Full code  Family Communication:  Status is: inpatient Dispo:   The patient is from: home Anticipated d/c is to: home Anticipated d/c date is: likely 1-2 days Patient currently is not medically stable to d/c due to: still needing D10 infusion but likely can get off by end of today.   Subjective and Interval History:  Pt complained of dizziness overnight, however, BG had been staying up, and D10 gtt decreased.  Pt also complained of headache, but both improved this morning.  No fever, dyspnea, chest pain, abdominal pain, N/V/D, dysuria.   Objective: Vitals:   05/18/19 1525 05/18/19 2008 05/19/19 0015 05/19/19 0805  BP: 136/80  119/78 139/76  Pulse: 89  91 86  Resp: 18  18 16   Temp: 98.6 F (37 C)  (!) 97.4 F (36.3 C) (!) 97.5 F (36.4 C)  TempSrc: Oral  Axillary Oral  SpO2: 98% 98% 97% 98%  Weight:      Height:        Intake/Output Summary (Last 24 hours) at 05/19/2019 1638 Last data filed at 05/18/2019 1830 Gross per 24 hour  Intake 120 ml  Output --  Net 120 ml   Filed Weights   05/16/19 1644 05/16/19 2009  Weight: 108 kg 107.7  kg    Examination:   Constitutional: NAD, AAOx3 HEENT: conjunctivae and lids normal, EOMI CV: RRR no M,R,G. Distal pulses +2.  No cyanosis.   RESP: CTA B/L, normal respiratory effort  GI: +BS, NTND Extremities: No effusions, edema, or tenderness in BLE SKIN: warm, dry and intact Neuro: II - XII grossly intact.  Sensation intact   Data Reviewed: I have personally reviewed following labs and imaging studies  CBC: Recent Labs  Lab 05/15/19 1734 05/16/19 1718 05/16/19 1916 05/18/19 0407  05/19/19 0513  WBC 6.4 7.0 6.8 5.9 6.0  NEUTROABS  --  4.5  --   --   --   HGB 13.2 12.0 12.5 12.6 12.0  HCT 42.6 37.6 40.0 40.4 37.5  MCV 95.5 93.8 95.2 95.3 93.5  PLT 304 277 287 290 794   Basic Metabolic Panel: Recent Labs  Lab 05/15/19 1734 05/15/19 1734 05/16/19 1718 05/16/19 1916 05/17/19 0505 05/18/19 0407 05/19/19 0513  NA 140  --  137  --  141 140 140  K 3.9  --  3.5  --  3.8 3.8 3.8  CL 108  --  104  --  107 105 106  CO2 26  --  24  --  28 28 28   GLUCOSE 248*  --  171*  --  103* 123* 176*  BUN 21*  --  18  --  12 17 19   CREATININE 0.95   < > 0.70 0.71 0.73 1.02* 0.97  CALCIUM 9.0  --  8.7*  --  9.1 8.9 8.7*  MG  --   --   --   --   --  2.1 2.1   < > = values in this interval not displayed.   GFR: Estimated Creatinine Clearance: 82.4 mL/min (by C-G formula based on SCr of 0.97 mg/dL). Liver Function Tests: Recent Labs  Lab 05/16/19 1718  AST 19  ALT 28  ALKPHOS 58  BILITOT 0.3  PROT 7.2  ALBUMIN 3.5   Recent Labs  Lab 05/16/19 1718  LIPASE 45   No results for input(s): AMMONIA in the last 168 hours. Coagulation Profile: No results for input(s): INR, PROTIME in the last 168 hours. Cardiac Enzymes: No results for input(s): CKTOTAL, CKMB, CKMBINDEX, TROPONINI in the last 168 hours. BNP (last 3 results) No results for input(s): PROBNP in the last 8760 hours. HbA1C: No results for input(s): HGBA1C in the last 72 hours. CBG: Recent Labs  Lab 05/19/19 0016 05/19/19 0410 05/19/19 0808 05/19/19 1156 05/19/19 1609  GLUCAP 233* 170* 160* 193* 234*   Lipid Profile: No results for input(s): CHOL, HDL, LDLCALC, TRIG, CHOLHDL, LDLDIRECT in the last 72 hours. Thyroid Function Tests: No results for input(s): TSH, T4TOTAL, FREET4, T3FREE, THYROIDAB in the last 72 hours. Anemia Panel: No results for input(s): VITAMINB12, FOLATE, FERRITIN, TIBC, IRON, RETICCTPCT in the last 72 hours. Sepsis Labs: No results for input(s): PROCALCITON, LATICACIDVEN in the  last 168 hours.  Recent Results (from the past 240 hour(s))  Respiratory Panel by RT PCR (Flu A&B, Covid) - Nasopharyngeal Swab     Status: None   Collection Time: 05/16/19  5:22 PM   Specimen: Nasopharyngeal Swab  Result Value Ref Range Status   SARS Coronavirus 2 by RT PCR NEGATIVE NEGATIVE Final    Comment: (NOTE) SARS-CoV-2 target nucleic acids are NOT DETECTED. The SARS-CoV-2 RNA is generally detectable in upper respiratoy specimens during the acute phase of infection. The lowest concentration of SARS-CoV-2 viral copies this assay can detect  is 131 copies/mL. A negative result does not preclude SARS-Cov-2 infection and should not be used as the sole basis for treatment or other patient management decisions. A negative result may occur with  improper specimen collection/handling, submission of specimen other than nasopharyngeal swab, presence of viral mutation(s) within the areas targeted by this assay, and inadequate number of viral copies (<131 copies/mL). A negative result must be combined with clinical observations, patient history, and epidemiological information. The expected result is Negative. Fact Sheet for Patients:  PinkCheek.be Fact Sheet for Healthcare Providers:  GravelBags.it This test is not yet ap proved or cleared by the [place] FDA and  has been authorized for detection and/or diagnosis of SARS-CoV-2 by FDA under an Emergency Use Authorization (EUA). This EUA will remain  in effect (meaning this test can be used) for the duration of the COVID-19 declaration under Section 564(b)(1) of the Act, 21 U.S.C. section 360bbb-3(b)(1), unless the authorization is terminated or revoked sooner.    Influenza A by PCR NEGATIVE NEGATIVE Final   Influenza B by PCR NEGATIVE NEGATIVE Final    Comment: (NOTE) The Xpert Xpress SARS-CoV-2/FLU/RSV assay is intended as an aid in  the diagnosis of influenza from  Nasopharyngeal swab specimens and  should not be used as a sole basis for treatment. Nasal washings and  aspirates are unacceptable for Xpert Xpress SARS-CoV-2/FLU/RSV  testing. Fact Sheet for Patients: PinkCheek.be Fact Sheet for Healthcare Providers: GravelBags.it This test is not yet approved or cleared by the [place] FDA and  has been authorized for detection and/or diagnosis of SARS-CoV-2 by  FDA under an Emergency Use Authorization (EUA). This EUA will remain  in effect (meaning this test can be used) for the duration of the  Covid-19 declaration under Section 564(b)(1) of the Act, 21  U.S.C. section 360bbb-3(b)(1), unless the authorization is  terminated or revoked. Performed at [facility], [address], [place], [place] [address]       Radiology Studies: No results found.   Scheduled Meds: . budesonide  0.5 mg Inhalation Daily  . carvedilol  12.5 mg Oral BID PC  . clopidogrel  75 mg Oral Daily  . cycloSPORINE  2 drop Both Eyes BID  . enoxaparin (LOVENOX) injection  40 mg Subcutaneous Q24H  . ezetimibe  10 mg Oral Daily  . gabapentin  400 mg Oral BID  . gabapentin  800 mg Oral QHS  . levETIRAcetam  500 mg Oral q morning - 10a  . levETIRAcetam  750 mg Oral QHS  . losartan  50 mg Oral Daily  . mirabegron ER  50 mg Oral Daily  . pantoprazole  40 mg Oral Daily  . polyethylene glycol  34 g Oral BID  . simvastatin  40 mg Oral QHS  . spironolactone  25 mg Oral Daily   Continuous Infusions: . dextrose 75 mL/hr at 05/19/19 1246     LOS: 2 days     Enzo Bi, MD [facility] If 7PM-7AM, please contact night-coverage 05/19/2019, 4:38 PM

## 2019-05-19 NOTE — Progress Notes (Signed)
CBG of 233. NP notified rate change of D10 ordered. Rate adjusted accordingly. Will continue to monitor.

## 2019-05-20 LAB — CBC
HCT: 35.8 % — ABNORMAL LOW (ref 36.0–46.0)
Hemoglobin: 11 g/dL — ABNORMAL LOW (ref 12.0–15.0)
MCH: 29.7 pg (ref 26.0–34.0)
MCHC: 30.7 g/dL (ref 30.0–36.0)
MCV: 96.8 fL (ref 80.0–100.0)
Platelets: 239 10*3/uL (ref 150–400)
RBC: 3.7 MIL/uL — ABNORMAL LOW (ref 3.87–5.11)
RDW: 13.8 % (ref 11.5–15.5)
WBC: 6 10*3/uL (ref 4.0–10.5)
nRBC: 0 % (ref 0.0–0.2)

## 2019-05-20 LAB — BASIC METABOLIC PANEL
Anion gap: 6 (ref 5–15)
BUN: 17 mg/dL (ref 6–20)
CO2: 27 mmol/L (ref 22–32)
Calcium: 8.7 mg/dL — ABNORMAL LOW (ref 8.9–10.3)
Chloride: 109 mmol/L (ref 98–111)
Creatinine, Ser: 0.88 mg/dL (ref 0.44–1.00)
GFR calc Af Amer: >60 mL/min (ref >60)
GFR calc non Af Amer: >60 mL/min (ref >60)
Glucose, Bld: 161 mg/dL — ABNORMAL HIGH (ref 70–99)
Potassium: 4.3 mmol/L (ref 3.5–5.1)
Sodium: 142 mmol/L (ref 135–145)

## 2019-05-20 LAB — GLUCOSE, CAPILLARY
Glucose-Capillary: 140 mg/dL — ABNORMAL HIGH (ref 70–99)
Glucose-Capillary: 140 mg/dL — ABNORMAL HIGH (ref 70–99)
Glucose-Capillary: 168 mg/dL — ABNORMAL HIGH (ref 70–99)
Glucose-Capillary: 195 mg/dL — ABNORMAL HIGH (ref 70–99)
Glucose-Capillary: 205 mg/dL — ABNORMAL HIGH (ref 70–99)

## 2019-05-20 LAB — MAGNESIUM: Magnesium: 2.1 mg/dL (ref 1.7–2.4)

## 2019-05-20 MED ORDER — INSULIN ASPART 100 UNIT/ML ~~LOC~~ SOLN
5.0000 [IU] | Freq: Once | SUBCUTANEOUS | Status: AC
  Administered 2019-05-20: 5 [IU] via SUBCUTANEOUS
  Filled 2019-05-20: qty 1

## 2019-05-20 NOTE — Progress Notes (Signed)
PROGRESS NOTE    Shaindel Sweeten  KDT:[id number] DOB: [birth year] DOA: 05/16/2019 PCP: Hubbard Hartshorn, [name]    Assessment & Plan:   Principal Problem:   Hypoglycemia associated with type 2 diabetes mellitus ([place]) Active Problems:   Essential hypertension   Seizure disorder ([place])   History of CVA (cerebrovascular accident)   COPD (chronic obstructive pulmonary disease) ([place])   CAD (coronary artery disease)   Hypoglycemia    Temperance Payeton Germani is a 54 y.o. AA female with medical history significant for COPD, diabetes, OSA on CPAP, diastolic heart failure and GERD who presents to the emergency room for the second time in 2 days for evaluation of altered mental status.  On her first visit to the emergency room yesterday, blood sugar was in the 30s, she was treated in the emergency room and discharge.  Today she went to a doctor's appointment and was found to be confused and unsteady on her feet and her blood sugar was 35.  EMS reported blood sugar of 35 improving to 90 with 400 mL of D10 to which she responded and became more alert.     Hypoglycemia, improved Insulin-dependent type 2 diabetes mellitus ([place]) -Patient with 2 days of hypoglycemia not responding to initial treatment in the emergency room outpatient management -Etiology of persistent hypoglycemia likely due to weekly diraglutide (last received Sunday 4/25) -Patient on high-dose subQ insulin as well  --Has been on D10 gtt from 200 to 25 ml/hr since presentation.  BG finally stopped dropping since yesterday. PLAN:  -Hold all hypoglycemic agents --d/c D10 this morning -q4h BG checks -Regular diet --Messaged pt's outpatient endocrin provider NP Pasty Arch at [place], waiting to hear back    Essential hypertension --BP grossly elevated 2/2 home BP meds not ordered on admission. --continue home coreg, losartan, aldactone    Seizure disorder ([place]) -Continue home Keppra    History of CVA (cerebrovascular  accident) -Continue statin, Zetia and plavix    COPD (chronic obstructive pulmonary disease) ([place]) Asthma --continue home pulmicort neb -DuoNebs as needed --Gave bi-weekly Dupilumab injection yesterday (brought in home-med).    CAD (coronary artery disease) -No complaints of chest pain -Troponin negative and EKG shows no acute ST-T wave changes  OSA on CPAP   DVT prophylaxis: Lovenox SQ Code Status: Full code  Family Communication:  Status is: inpatient Dispo:   The patient is from: home Anticipated d/c is to: home Anticipated d/c date is: likely tomorrow Patient currently is not medically stable to d/c due to: Need to monitor off D10 gtt to ensure no BG drop before discharge.     Subjective and Interval History:  Pt reported feeling better.  Dizziness improved.  No more drop in BG.  No fever, dyspnea, chest pain, abdominal pain, N/V/D, dysuria.  Eating full meals.     Objective: Vitals:   05/19/19 0805 05/19/19 2000 05/19/19 2354 05/20/19 0819  BP: 139/76  112/83 138/86  Pulse: 86  98 84  Resp: 16  18 20   Temp: (!) 97.5 F (36.4 C) 98.5 F (36.9 C) 97.6 F (36.4 C) 98.1 F (36.7 C)  TempSrc: Oral Oral Oral Oral  SpO2: 98% 97% 100% 100%  Weight:      Height:        Intake/Output Summary (Last 24 hours) at 05/20/2019 1423 Last data filed at 05/20/2019 0700 Gross per 24 hour  Intake 7229.95 ml  Output --  Net 7229.95 ml   Filed Weights   05/16/19 1644 05/16/19 2009  Weight: 108 kg 107.7 kg    Examination:   Constitutional: NAD, AAOx3 HEENT: conjunctivae and lids normal, EOMI CV: RRR no M,R,G. Distal pulses +2.  No cyanosis.   RESP: CTA B/L, normal respiratory effort  GI: +BS, NTND Extremities: No effusions, edema, or tenderness in BLE SKIN: warm, dry and intact Neuro: II - XII grossly intact.  Sensation intact   Data Reviewed: I have personally reviewed following labs and imaging studies  CBC: Recent Labs  Lab 05/16/19 1718 05/16/19 1916  05/18/19 0407 05/19/19 0513 05/20/19 0428  WBC 7.0 6.8 5.9 6.0 6.0  NEUTROABS 4.5  --   --   --   --   HGB 12.0 12.5 12.6 12.0 11.0*  HCT 37.6 40.0 40.4 37.5 35.8*  MCV 93.8 95.2 95.3 93.5 96.8  PLT 277 287 290 259 093   Basic Metabolic Panel: Recent Labs  Lab 05/16/19 1718 05/16/19 1718 05/16/19 1916 05/17/19 0505 05/18/19 0407 05/19/19 0513 05/20/19 0428  NA 137  --   --  141 140 140 142  K 3.5  --   --  3.8 3.8 3.8 4.3  CL 104  --   --  107 105 106 109  CO2 24  --   --  28 28 28 27   GLUCOSE 171*  --   --  103* 123* 176* 161*  BUN 18  --   --  12 17 19 17   CREATININE 0.70   < > 0.71 0.73 1.02* 0.97 0.88  CALCIUM 8.7*  --   --  9.1 8.9 8.7* 8.7*  MG  --   --   --   --  2.1 2.1 2.1   < > = values in this interval not displayed.   GFR: Estimated Creatinine Clearance: 90.8 mL/min (by C-G formula based on SCr of 0.88 mg/dL). Liver Function Tests: Recent Labs  Lab 05/16/19 1718  AST 19  ALT 28  ALKPHOS 58  BILITOT 0.3  PROT 7.2  ALBUMIN 3.5   Recent Labs  Lab 05/16/19 1718  LIPASE 45   No results for input(s): AMMONIA in the last 168 hours. Coagulation Profile: No results for input(s): INR, PROTIME in the last 168 hours. Cardiac Enzymes: No results for input(s): CKTOTAL, CKMB, CKMBINDEX, TROPONINI in the last 168 hours. BNP (last 3 results) No results for input(s): PROBNP in the last 8760 hours. HbA1C: No results for input(s): HGBA1C in the last 72 hours. CBG: Recent Labs  Lab 05/19/19 1609 05/19/19 2055 05/19/19 2353 05/20/19 0328 05/20/19 1206  GLUCAP 234* 233* 204* 195* 140*   Lipid Profile: No results for input(s): CHOL, HDL, LDLCALC, TRIG, CHOLHDL, LDLDIRECT in the last 72 hours. Thyroid Function Tests: No results for input(s): TSH, T4TOTAL, FREET4, T3FREE, THYROIDAB in the last 72 hours. Anemia Panel: No results for input(s): VITAMINB12, FOLATE, FERRITIN, TIBC, IRON, RETICCTPCT in the last 72 hours. Sepsis Labs: No results for input(s):  PROCALCITON, LATICACIDVEN in the last 168 hours.  Recent Results (from the past 240 hour(s))  Respiratory Panel by RT PCR (Flu A&B, Covid) - Nasopharyngeal Swab     Status: None   Collection Time: 05/16/19  5:22 PM   Specimen: Nasopharyngeal Swab  Result Value Ref Range Status   SARS Coronavirus 2 by RT PCR NEGATIVE NEGATIVE Final    Comment: (NOTE) SARS-CoV-2 target nucleic acids are NOT DETECTED. The SARS-CoV-2 RNA is generally detectable in upper respiratoy specimens during the acute phase of infection. The lowest concentration of SARS-CoV-2 viral copies this assay  can detect is 131 copies/mL. A negative result does not preclude SARS-Cov-2 infection and should not be used as the sole basis for treatment or other patient management decisions. A negative result may occur with  improper specimen collection/handling, submission of specimen other than nasopharyngeal swab, presence of viral mutation(s) within the areas targeted by this assay, and inadequate number of viral copies (<131 copies/mL). A negative result must be combined with clinical observations, patient history, and epidemiological information. The expected result is Negative. Fact Sheet for Patients:  PinkCheek.be Fact Sheet for Healthcare Providers:  GravelBags.it This test is not yet ap proved or cleared by the [place] FDA and  has been authorized for detection and/or diagnosis of SARS-CoV-2 by FDA under an Emergency Use Authorization (EUA). This EUA will remain  in effect (meaning this test can be used) for the duration of the COVID-19 declaration under Section 564(b)(1) of the Act, 21 U.S.C. section 360bbb-3(b)(1), unless the authorization is terminated or revoked sooner.    Influenza A by PCR NEGATIVE NEGATIVE Final   Influenza B by PCR NEGATIVE NEGATIVE Final    Comment: (NOTE) The Xpert Xpress SARS-CoV-2/FLU/RSV assay is intended as an aid in  the  diagnosis of influenza from Nasopharyngeal swab specimens and  should not be used as a sole basis for treatment. Nasal washings and  aspirates are unacceptable for Xpert Xpress SARS-CoV-2/FLU/RSV  testing. Fact Sheet for Patients: PinkCheek.be Fact Sheet for Healthcare Providers: GravelBags.it This test is not yet approved or cleared by the [place] FDA and  has been authorized for detection and/or diagnosis of SARS-CoV-2 by  FDA under an Emergency Use Authorization (EUA). This EUA will remain  in effect (meaning this test can be used) for the duration of the  Covid-19 declaration under Section 564(b)(1) of the Act, 21  U.S.C. section 360bbb-3(b)(1), unless the authorization is  terminated or revoked. Performed at [facility], [address], [place], [place] [address]       Radiology Studies: No results found.   Scheduled Meds: . budesonide  0.5 mg Inhalation Daily  . carvedilol  12.5 mg Oral BID PC  . clopidogrel  75 mg Oral Daily  . cycloSPORINE  2 drop Both Eyes BID  . dupilumab  300 mg Subcutaneous Q14 Days  . enoxaparin (LOVENOX) injection  40 mg Subcutaneous Q24H  . ezetimibe  10 mg Oral Daily  . gabapentin  400 mg Oral BID  . gabapentin  800 mg Oral QHS  . levETIRAcetam  500 mg Oral q morning - 10a  . levETIRAcetam  750 mg Oral QHS  . losartan  50 mg Oral Daily  . mirabegron ER  50 mg Oral Daily  . pantoprazole  40 mg Oral Daily  . polyethylene glycol  34 g Oral BID  . simvastatin  40 mg Oral QHS  . spironolactone  25 mg Oral Daily   Continuous Infusions:    LOS: 3 days     Enzo Bi, MD [facility] If 7PM-7AM, please contact night-coverage 05/20/2019, 2:23 PM

## 2019-05-21 LAB — BASIC METABOLIC PANEL
Anion gap: 5 (ref 5–15)
BUN: 20 mg/dL (ref 6–20)
CO2: 28 mmol/L (ref 22–32)
Calcium: 8.7 mg/dL — ABNORMAL LOW (ref 8.9–10.3)
Chloride: 109 mmol/L (ref 98–111)
Creatinine, Ser: 0.83 mg/dL (ref 0.44–1.00)
GFR calc Af Amer: >60 mL/min (ref >60)
GFR calc non Af Amer: >60 mL/min (ref >60)
Glucose, Bld: 164 mg/dL — ABNORMAL HIGH (ref 70–99)
Potassium: 4 mmol/L (ref 3.5–5.1)
Sodium: 142 mmol/L (ref 135–145)

## 2019-05-21 LAB — MAGNESIUM: Magnesium: 2.1 mg/dL (ref 1.7–2.4)

## 2019-05-21 LAB — CBC
HCT: 34.6 % — ABNORMAL LOW (ref 36.0–46.0)
Hemoglobin: 10.8 g/dL — ABNORMAL LOW (ref 12.0–15.0)
MCH: 30 pg (ref 26.0–34.0)
MCHC: 31.2 g/dL (ref 30.0–36.0)
MCV: 96.1 fL (ref 80.0–100.0)
Platelets: 221 10*3/uL (ref 150–400)
RBC: 3.6 MIL/uL — ABNORMAL LOW (ref 3.87–5.11)
RDW: 13.9 % (ref 11.5–15.5)
WBC: 6.3 10*3/uL (ref 4.0–10.5)
nRBC: 0 % (ref 0.0–0.2)

## 2019-05-21 LAB — GLUCOSE, CAPILLARY
Glucose-Capillary: 147 mg/dL — ABNORMAL HIGH (ref 70–99)
Glucose-Capillary: 183 mg/dL — ABNORMAL HIGH (ref 70–99)
Glucose-Capillary: 207 mg/dL — ABNORMAL HIGH (ref 70–99)

## 2019-05-21 MED ORDER — INSULIN GLARGINE (2 UNIT DIAL) 300 UNIT/ML ~~LOC~~ SOPN: PEN_INJECTOR | SUBCUTANEOUS | 1 refills | Status: DC

## 2019-05-21 MED ORDER — INSULIN ASPART 100 UNIT/ML ~~LOC~~ SOLN
20.0000 [IU] | Freq: Three times a day (TID) | SUBCUTANEOUS | Status: DC
  Administered 2019-05-21: 10 [IU] via SUBCUTANEOUS
  Filled 2019-05-21: qty 1

## 2019-05-21 MED ORDER — INSULIN ASPART 100 UNIT/ML ~~LOC~~ SOLN: 10.0000 [IU] | Freq: Three times a day (TID) | SUBCUTANEOUS | Status: DC

## 2019-05-21 MED ORDER — POTASSIUM CHLORIDE CRYS ER 20 MEQ PO TBCR: EXTENDED_RELEASE_TABLET | ORAL | 5 refills | Status: DC

## 2019-05-21 NOTE — Progress Notes (Signed)
Pt d/c to home via family. IV removed intact. VSS. Education completed. All belongings sent with pt. All questions answered.

## 2019-05-21 NOTE — TOC Transition Note (Signed)
Transition of Care [facility]) - CM/SW Discharge Note   Patient Details  Name: [name] MRN: [id number] Date of Birth: [birth year]  [facility] CM/SW Contact:  Boris Sharper, LCSW Phone Number: 05/21/2019, 12:02 PM   Clinical Narrative:    Pt medially stable for discharge per MD. CSW contacted pt and notified her of PT recommendations, she was agreeable to HHPT and stated that she did not have a preference in facilities. CSW contacted Tillie Rung at [facility] and they were able to accept. CSW contacted [facility] for pt's DME needs. CSW will deliver 3n1 to pt's room.    Final next level of care: Home w Home Health Services Barriers to Discharge: No Barriers Identified   Patient Goals and CMS Choice   CMS Medicare.gov Compare Post Acute Care list provided to:: Patient Choice offered to / list presented to : Patient  Discharge Placement                Patient to be transferred to facility by: daughter Name of family member notified: [name] Patient and family notified of of transfer: 05/21/19  Discharge Plan and Services                DME Arranged: Bedside commode DME Agency: [employer] Date DME Agency Contacted: 05/21/19 Time DME Agency Contacted: [id number] Representative spoke with at DME Agency: [place]: PT [place]: [facility] [place] Date [place]: 05/21/19 Time [place]: [place] Representative spoke with at [place]: [place] ([place]) Interventions     Readmission Risk Interventions No flowsheet data found.

## 2019-05-21 NOTE — Discharge Summary (Signed)
Physician Discharge Summary   [name]  female DOB: 1965/09/08  YQM:[id number]  PCP: Hubbard Hartshorn, [name]  Admit date: 05/16/2019 Discharge date: 05/21/2019  Admitted From: home Disposition:  Home Daughter updated on the phone prior to discharge. Home Health: Yes CODE STATUS: Full code  Discharge Instructions    Discharge instructions   Complete by: As directed    Please do not take your weekly Dulaglutide and insulin glargine (long-acting daily insulin) until you follow up with your endocrinology provider.  For now, just take your short-acting meal-time, and keep a log of your blood sugar readings.   Dr. Enzo Bi - -   Increase activity slowly   Complete by: As directed        Hospital Course:  For full details, please see H&P, progress notes, consult notes and ancillary notes.  Briefly,  [name] a 54 y.o.AA femalewith medical history significant forCOPD, diabetes,OSA on CPAP,diastolic heart failure and GERD who presented to the emergency room for the second time in 2 days for evaluation of altered mental status. On her first visit to the emergency room the day prior, blood sugar was in the 30s, she was treated in the emergency room and discharge. On the day of presentation, she went to a doctor's appointment and was found to be confused and unsteady on her feet and her blood sugar was 35. EMS reported blood sugar of 35 improving to 90 with 400 mL of D10 to which she responded and became more alert.    Hypoglycemia, resolved Insulin-dependent type 2 diabetes mellitus ([place]) Patient with 2 days of hypoglycemia not responding to initial treatment in the emergency room and outpatient management.  Pt was ordered D10 gtt@200  ml/hr on admission, and infusion rate adjusted according to BG readings.  Pt's PTA home regimen was weekly diraglutide (last received Sunday 4/25) as well as daily high-dose subQ insulin both long-acting and meal-time coverage.   All insulin were held on presentation, however, however, pt required D10 infusion until 05/20/19 to keep from BG dropping even while eating every meal.  Most likely etiology for this persistent hypoglycemia was pt's weekly diraglutide, which finally resolved 6 days after pt received her last dose of diraglutide.  On discharge, weekly diraglutide and long-acting were held, and pt was advised to keep records of her BG readings and follow up with her outpatient endocrin provider for further adjustment of her insulin regimen.  Essential hypertension Initially, BP grossly elevated 2/2 home BP meds not ordered on admission.  BP more controlled with continuation of home coreg, losartan, aldactone.  Seizure disorder ([place]) Continued home Keppra  History of CVA (cerebrovascular accident) Continued statin, Zetia and plavix  COPD (chronic obstructive pulmonary disease) (HCC) Asthma continued home pulmicort neb, and DuoNebs as needed.  Gave bi-weekly Dupilumab injection on 05/20/19 (brought in home-med).  CAD (coronary artery disease) No complaints of chest pain.  Troponin negative and EKG shows no acute ST-T wave changes  OSA on CPAP   Discharge Diagnoses:  Principal Problem:   Hypoglycemia associated with type 2 diabetes mellitus ([place]) Active Problems:   Essential hypertension   Seizure disorder (HCC)   History of CVA (cerebrovascular accident)   COPD (chronic obstructive pulmonary disease) (HCC)   CAD (coronary artery disease)   Hypoglycemia    Discharge Instructions:  Allergies as of 05/21/2019      Reactions   Codeine Swelling, Other (See Comments)   Swelling and burning of mouth (inside)   Lactose Intolerance (  gi) Nausea And Vomiting      Medication List    STOP taking these medications   Dulaglutide 1.5 MG/0.5ML Sopn   oxyCODONE-acetaminophen 5-325 MG tablet Commonly known as: Percocet     TAKE these medications   albuterol 108 (90 Base) MCG/ACT  inhaler Commonly known as: ProAir HFA INHALE 2 PUFFS BY MOUTH EVERY 6 HOURS AS NEEDED FOR WHEEZING OR SHORTNESS OF BREATH   albuterol 1.25 MG/3ML nebulizer solution Commonly known as: ACCUNEB Inhale into the lungs. Use 3 mls by nebulization every 6 hours as needed for wheezing   budesonide 0.5 MG/2ML nebulizer solution Commonly known as: PULMICORT Inhale 0.5 mg into the lungs daily. Take 2 mls by nebulizations once daily   CALCIUM-VITAMIN D3 PO D3 500- calcium 630 mg Take 1 capsule by mouth once daily   carvedilol 12.5 MG tablet Commonly known as: COREG Take 1 tablet (12.5 mg total) by mouth 2 (two) times daily after a meal.   clopidogrel 75 MG tablet Commonly known as: PLAVIX Take 1 tablet (75 mg total) by mouth daily.   cycloSPORINE 0.05 % ophthalmic emulsion Commonly known as: RESTASIS Place 2 drops into both eyes 2 (two) times daily.   DULoxetine 20 MG capsule Commonly known as: Cymbalta Take 2 capsules q am and 1 capsule q pm   Dupixent 300 MG/2ML Sopn Generic drug: Dupilumab Inject 300 mg into the skin every 14 (fourteen) days.   EPINEPHrine 0.3 mg/0.3 mL Soaj injection Commonly known as: EPI-PEN Inject 0.3 mg into the muscle as needed for anaphylaxis.   ezetimibe 10 MG tablet Commonly known as: Zetia Take 1 tablet (10 mg total) by mouth daily.   fluticasone 50 MCG/ACT nasal spray Commonly known as: FLONASE Place 2 sprays into both nostrils daily.   gabapentin 400 MG capsule Commonly known as: NEURONTIN 400 mg qAM, 400 mg qPM, 800 mg qhs   insulin glargine (2 Unit Dial) 300 UNIT/ML Solostar Pen Commonly known as: TOUJEO MAX Hold your long-acting until followup with your outpatient endocrinology provider. What changed:   how much to take  how to take this  when to take this  additional instructions   levETIRAcetam 500 MG tablet Commonly known as: KEPPRA Take 500 mg by mouth 2 (two) times daily. 1 TAB IN THE MORNING AND 1.5 TABS AT NIGHT    losartan 50 MG tablet Commonly known as: COZAAR Take 50 mg by mouth daily.   mirabegron ER 50 MG Tb24 tablet Commonly known as: Myrbetriq Take 1 tablet (50 mg total) by mouth daily.   nitroGLYCERIN 0.4 MG SL tablet Commonly known as: NITROSTAT Place 1 tablet (0.4 mg total) under the tongue every 5 (five) minutes as needed for chest pain.   NovoLOG FlexPen 100 UNIT/ML FlexPen Generic drug: insulin aspart Inject 18-50 Units into the skin 3 (three) times daily with meals.   ondansetron 4 MG disintegrating tablet Commonly known as: Zofran ODT Take 1 tablet (4 mg total) by mouth every 8 (eight) hours as needed for nausea or vomiting.   pantoprazole 40 MG tablet Commonly known as: PROTONIX Take 1 tablet (40 mg total) by mouth daily.   polyethylene glycol 17 g packet Commonly known as: MIRALAX / GLYCOLAX Take 17 g by mouth daily as needed for mild constipation.   potassium chloride SA 20 MEQ tablet Commonly known as: Klor-Con M20 You have not needed potassium while in the hospital.  Hold until outpatient followup. What changed:   how much to take  how to take  this  when to take this  additional instructions   simvastatin 40 MG tablet Commonly known as: ZOCOR Take 40 mg by mouth daily.   spironolactone 25 MG tablet Commonly known as: ALDACTONE Take 1 tablet by mouth daily.   terconazole 0.4 % vaginal cream Commonly known as: TERAZOL 7 Place 1 applicator vaginally at bedtime as needed (irritation).   Wixela Inhub 100-50 MCG/DOSE Aepb Generic drug: Fluticasone-Salmeterol Inhale 1 puff into the lungs 2 (two) times daily.       Follow-up Information    Hubbard Hartshorn, [name]. Schedule an appointment as soon as possible for a visit in 1 week(s).   Specialty: Family Medicine Contact information: [address] [place] [address] [phone]        your outpatient endocrin provider. Schedule an appointment as soon as possible for a visit in 1  week(s).           Allergies  Allergen Reactions  . Codeine Swelling and Other (See Comments)    Swelling and burning of mouth (inside)  . Lactose Intolerance (Gi) Nausea And Vomiting     The results of significant diagnostics from this hospitalization (including imaging, microbiology, ancillary and laboratory) are listed below for reference.   Consultations:   Procedures/Studies: DG Chest 2 View  Result Date: 05/15/2019 CLINICAL DATA:  Hypoglycemia. EXAM: CHEST - 2 VIEW COMPARISON:  Chest radiograph 02/27/2019 FINDINGS: Stable cardiomediastinal contours. The lungs are clear. No pneumothorax or pleural effusion. No acute finding in the visualized skeleton. IMPRESSION: No acute cardiopulmonary finding. Electronically Signed   By: Audie Pinto M.D.   On: 05/15/2019 18:09   DG Chest 2 View  Result Date: 04/27/2019 CLINICAL DATA:  Chest pain EXAM: CHEST - 2 VIEW COMPARISON:  November 07, 2018 FINDINGS: Lungs are clear. Heart size and pulmonary vascularity are normal. No adenopathy. No pneumothorax. No bone lesions. IMPRESSION: Lungs clear.  Cardiac silhouette within normal limits. Electronically Signed   By: Lowella Grip III M.D.   On: 04/27/2019 09:46   CARDIAC CATHETERIZATION  Result Date: 02/20/3084  LV end diastolic pressure is normal.  Normal left ventricular function of 60%  Normal right sided pressures no evidence of pulmonary hypertension  Normal coronaries  Conclusion Normal right-sided heart cath Wedge mean of 10 PA mean of 22 Normal left ventricular function of 60% Normal coronaries   DG Knee Complete 4 Views Left  Result Date: 04/27/2019 CLINICAL DATA:  Patient reports she had a syncopal episode on Saturday and fell onto left knee. Patient reports pain and bruising to left knee since. Also reports difficulty bearing weight. EXAM: LEFT KNEE - COMPLETE 4+ VIEW COMPARISON:  None. FINDINGS: No fracture or bone lesion. Knee joint is normally spaced and aligned. No  arthropathic changes. No joint effusion. Mild subcutaneous soft tissue edema is noted along the anterolateral knee. IMPRESSION: No fracture or joint abnormality Electronically Signed   By: Lajean Manes M.D.   On: 04/27/2019 12:28   DG Humerus Left  Result Date: 05/15/2019 CLINICAL DATA:  Left arm pain after possible fall EXAM: LEFT HUMERUS - 2+ VIEW COMPARISON:  None. FINDINGS: There is no evidence of fracture or other focal bone lesions. Soft tissues are unremarkable. IMPRESSION: Neg negative radiographs of the left humerus. Electronically Signed   By: Audie Pinto M.D.   On: 05/15/2019 19:12      Labs: BNP (last 3 results) Recent Labs    04/12/19 1306  BNP 57.8   Basic Metabolic Panel: Recent Labs  Lab 05/17/19 0505 05/18/19 0407 05/19/19 0513 05/20/19 0428 05/21/19 0505  NA 141 140 140 142 142  K 3.8 3.8 3.8 4.3 4.0  CL 107 105 106 109 109  CO2 28 28 28 27 28   GLUCOSE 103* 123* 176* 161* 164*  BUN 12 17 19 17 20   CREATININE 0.73 1.02* 0.97 0.88 0.83  CALCIUM 9.1 8.9 8.7* 8.7* 8.7*  MG  --  2.1 2.1 2.1 2.1   Liver Function Tests: Recent Labs  Lab 05/16/19 1718  AST 19  ALT 28  ALKPHOS 58  BILITOT 0.3  PROT 7.2  ALBUMIN 3.5   Recent Labs  Lab 05/16/19 1718  LIPASE 45   No results for input(s): AMMONIA in the last 168 hours. CBC: Recent Labs  Lab 05/16/19 1718 05/16/19 1718 05/16/19 1916 05/18/19 0407 05/19/19 0513 05/20/19 0428 05/21/19 0505  WBC 7.0   < > 6.8 5.9 6.0 6.0 6.3  NEUTROABS 4.5  --   --   --   --   --   --   HGB 12.0   < > 12.5 12.6 12.0 11.0* 10.8*  HCT 37.6   < > 40.0 40.4 37.5 35.8* 34.6*  MCV 93.8   < > 95.2 95.3 93.5 96.8 96.1  PLT 277   < > 287 290 259 239 221   < > = values in this interval not displayed.   Cardiac Enzymes: No results for input(s): CKTOTAL, CKMB, CKMBINDEX, TROPONINI in the last 168 hours. BNP: Invalid input(s): POCBNP CBG: Recent Labs  Lab 05/20/19 1610 05/20/19 2037 05/20/19 2348 05/21/19 0351  05/21/19 0809  GLUCAP 168* 205* 140* 147* 183*   D-Dimer No results for input(s): DDIMER in the last 72 hours. Hgb A1c No results for input(s): HGBA1C in the last 72 hours. Lipid Profile No results for input(s): CHOL, HDL, LDLCALC, TRIG, CHOLHDL, LDLDIRECT in the last 72 hours. Thyroid function studies No results for input(s): TSH, T4TOTAL, T3FREE, THYROIDAB in the last 72 hours.  Invalid input(s): FREET3 Anemia work up No results for input(s): VITAMINB12, FOLATE, FERRITIN, TIBC, IRON, RETICCTPCT in the last 72 hours. Urinalysis    Component Value Date/Time   COLORURINE STRAW (A) 05/16/2019 1912   APPEARANCEUR CLEAR (A) 05/16/2019 1912   LABSPEC 1.005 05/16/2019 1912   PHURINE 6.0 05/16/2019 1912   GLUCOSEU >=500 (A) 05/16/2019 1912   HGBUR NEGATIVE 05/16/2019 1912   HGBUR negative 09/30/2006 1400   BILIRUBINUR NEGATIVE 05/16/2019 1912   BILIRUBINUR neg 11/11/2016 1214   KETONESUR NEGATIVE 05/16/2019 1912   PROTEINUR NEGATIVE 05/16/2019 1912   UROBILINOGEN 0.2 11/11/2016 1214   UROBILINOGEN 0.2 06/18/2014 0028   NITRITE POSITIVE (A) 05/16/2019 1912   LEUKOCYTESUR SMALL (A) 05/16/2019 1912   Sepsis Labs Invalid input(s): PROCALCITONIN,  WBC,  LACTICIDVEN Microbiology Recent Results (from the past 240 hour(s))  Respiratory Panel by RT PCR (Flu A&B, Covid) - Nasopharyngeal Swab     Status: None   Collection Time: 05/16/19  5:22 PM   Specimen: Nasopharyngeal Swab  Result Value Ref Range Status   SARS Coronavirus 2 by RT PCR NEGATIVE NEGATIVE Final    Comment: (NOTE) SARS-CoV-2 target nucleic acids are NOT DETECTED. The SARS-CoV-2 RNA is generally detectable in upper respiratoy specimens during the acute phase of infection. The lowest concentration of SARS-CoV-2 viral copies this assay can detect is 131 copies/mL. A negative result does not preclude SARS-Cov-2 infection and should not be used as the sole basis for treatment or other patient management decisions. A  negative result may occur with  improper specimen collection/handling, submission of specimen other than nasopharyngeal swab, presence of viral mutation(s) within the areas targeted by this assay, and inadequate number of viral copies (<131 copies/mL). A negative result must be combined with clinical observations, patient history, and epidemiological information. The expected result is Negative. Fact Sheet for Patients:  PinkCheek.be Fact Sheet for Healthcare Providers:  GravelBags.it This test is not yet ap proved or cleared by the [place] FDA and  has been authorized for detection and/or diagnosis of SARS-CoV-2 by FDA under an Emergency Use Authorization (EUA). This EUA will remain  in effect (meaning this test can be used) for the duration of the COVID-19 declaration under Section 564(b)(1) of the Act, 21 U.S.C. section 360bbb-3(b)(1), unless the authorization is terminated or revoked sooner.    Influenza A by PCR NEGATIVE NEGATIVE Final   Influenza B by PCR NEGATIVE NEGATIVE Final    Comment: (NOTE) The Xpert Xpress SARS-CoV-2/FLU/RSV assay is intended as an aid in  the diagnosis of influenza from Nasopharyngeal swab specimens and  should not be used as a sole basis for treatment. Nasal washings and  aspirates are unacceptable for Xpert Xpress SARS-CoV-2/FLU/RSV  testing. Fact Sheet for Patients: PinkCheek.be Fact Sheet for Healthcare Providers: GravelBags.it This test is not yet approved or cleared by the [place] FDA and  has been authorized for detection and/or diagnosis of SARS-CoV-2 by  FDA under an Emergency Use Authorization (EUA). This EUA will remain  in effect (meaning this test can be used) for the duration of the  Covid-19 declaration under Section 564(b)(1) of the Act, 21  U.S.C. section 360bbb-3(b)(1), unless the authorization is   terminated or revoked. Performed at [facility] [address] [place], [place] [address]      Total time spend on discharging this patient, including the last patient exam, discussing the hospital stay, instructions for ongoing care as it relates to all pertinent caregivers, as well as preparing the medical discharge records, prescriptions, and/or referrals as applicable, is 40 minutes.    Enzo Bi, MD  [facility] 05/21/2019, 8:50 AM  If 7PM-7AM, please contact night-coverage

## 2019-05-21 NOTE — Progress Notes (Signed)
Physical Therapy Evaluation Patient Details Name: [name] MRN: [id number] DOB: Mar 17, 1965 Today's Date: 05/21/2019   History of Present Illness  Per MD note:[name] is a 54 y.o. female with medical history significant for COPD, diabetes, OSA on CPAP, diastolic heart failure and GERD who presents to the emergency room for the second time in 2 days for evaluation of altered mental status.  On her first visit to the emergency room yesterday, blood sugar was in the 30s, she was treated in the emergency room and discharge.  Today she went to a doctor's appointment and was found to be confused and unsteady on her feet and her blood sugar was 35.  EMS reported blood sugar of 35 improving to 90 with 400 mL of D10 to which she responded and became more alert.  Patient denied any recent illness, cough, shortness of breath fever or chills, she denied chest pain, nausea vomiting, abdominal pain or change in bowel habit, headache or visual disturbance.  She also denies any recent changes or addition to her medication  Clinical Impression  Patient agrees to PT evaluation. Patient has [facility] strength on RUE and RLE with strength deficits in LUE and LLE due to previous impairments. Patient performs all mobility with MI using small based quad cane for ambulation and transfers sit to stand. She ambulate 20 feet with decreased gait speed and small based quad cane. She would benefit from having a 3 and 1 bedside commode for home use. She has family at home to help her and would benefit from HHPT to assess her home living environment for additional PT needs. She will be DC from PT in hospital .     Follow Up Recommendations Home health PT    Equipment Recommendations  Other (comment)(needs bedside commode)    Recommendations for Other Services       Precautions / Restrictions Restrictions Weight Bearing Restrictions: No      Mobility  Bed Mobility Overal bed mobility: Modified  Independent                Transfers Overall transfer level: Modified independent Equipment used: Quad cane             General transfer comment: VC for safety  Ambulation/Gait Ambulation/Gait assistance: Modified independent (Device/Increase time) Gait Distance (Feet): 20 Feet Assistive device: Quad cane Gait Pattern/deviations: Step-to pattern Gait velocity: (decreasaed gait speed)      Stairs            Wheelchair Mobility    Modified Rankin (Stroke Patients Only)       Balance Overall balance assessment: Mild deficits observed, not formally tested                                           Pertinent Vitals/Pain Pain Assessment: No/denies pain    Home Living Family/patient expects to be discharged to:: Private residence Living Arrangements: Children             Home Equipment: Kasandra Knudsen - quad Additional Comments: (needs bedside comode)    Prior Function Level of Independence: Independent with assistive device(s)               Hand Dominance   Dominant Hand: Right    Extremity/Trunk Assessment   Upper Extremity Assessment Upper Extremity Assessment: Overall WFL for tasks assessed(LUE impaired from prior cva)    Lower  Extremity Assessment Lower Extremity Assessment: Overall WFL for tasks assessed       Communication   Communication: No difficulties  Cognition Arousal/Alertness: Awake/alert Behavior During Therapy: WFL for tasks assessed/performed Overall Cognitive Status: Within Functional Limits for tasks assessed                                        General Comments      Exercises     Assessment/Plan    PT Assessment All further PT needs can be met in the next venue of care  PT Problem List Decreased strength;Decreased mobility       PT Treatment Interventions      PT Goals (Current goals can be found in the Care Plan section)  Acute Rehab PT Goals Patient Stated Goal: to  go hoome PT Goal Formulation: All assessment and education complete, DC therapy    Frequency     Barriers to discharge        Co-evaluation               AM-PAC PT "6 Clicks" Mobility  Outcome Measure Help needed turning from your back to your side while in a flat bed without using bedrails?: None Help needed moving from lying on your back to sitting on the side of a flat bed without using bedrails?: None Help needed moving to and from a bed to a chair (including a wheelchair)?: None Help needed standing up from a chair using your arms (e.g., wheelchair or bedside chair)?: None Help needed to walk in hospital room?: None Help needed climbing 3-5 steps with a railing? : A Little 6 Click Score: 23    End of Session Equipment Utilized During Treatment: Gait belt Activity Tolerance: Patient tolerated treatment well Patient left: in bed;with bed alarm set Nurse Communication: Mobility status PT Visit Diagnosis: Difficulty in walking, not elsewhere classified (R26.2)    Time: 4627-0350 PT Time Calculation (min) (ACUTE ONLY): 20 min   Charges:   PT Evaluation $PT Eval Low Complexity: 1 Low PT Treatments $Gait Training: 8-22 mins         Janisha Bueso S, PT DPt 05/21/2019, 10:16 AM

## 2019-05-22 ENCOUNTER — Telehealth: Payer: Self-pay

## 2019-05-22 LAB — GLUCOSE, CAPILLARY: Glucose-Capillary: 93 mg/dL (ref 70–99)

## 2019-05-22 NOTE — Telephone Encounter (Signed)
Transition Care Management Follow-up Telephone Call  Date of discharge and from where: 05/21/19 [facility]  How have you been since you were released from the hospital? Pt states she is still feeling very weak and almost fell yesterday but using her cane. Pt also still having pain and taking gabapentin as prescribed but having a hard time with pain in her arm.   Any questions or concerns? Yes  pt states [facility] has not contacted her yet for home health services for nursing and physical therapy. Advised if they have not contacted her by end of day today to notify our office or social worker.   Items Reviewed:  Did the pt receive and understand the discharge instructions provided? Yes   Medications obtained and verified? Yes   Any new allergies since your discharge? No  Dietary orders reviewed? Yes  Do you have support at home? Yes   Functional Questionnaire: (I = Independent and D = Dependent) ADLs: I with assistance  Bathing/Dressing- D  Meal Prep- D  Eating- I  Maintaining continence- I  Transferring/Ambulation- I with assistance  Managing Meds- I  Follow up appointments reviewed:   PCP Hospital f/u appt confirmed? Yes  Scheduled to see Verline Lema PAC on 06/01/19.  [facility] f/u appt confirmed? Yes  Scheduled to see Dr. Allen Norris on 05/30/19.  Are transportation arrangements needed? No   If their condition worsens, is the pt aware to call PCP or go to the Emergency Dept.? Yes  Was the patient provided with contact information for the PCP's office or ED? Yes  Was to pt encouraged to call back with questions or concerns? Yes

## 2019-05-23 ENCOUNTER — Other Ambulatory Visit: Payer: Self-pay | Admitting: Family Medicine

## 2019-05-23 DIAGNOSIS — E1142 Type 2 diabetes mellitus with diabetic polyneuropathy: Secondary | ICD-10-CM

## 2019-05-23 DIAGNOSIS — G4733 Obstructive sleep apnea (adult) (pediatric): Secondary | ICD-10-CM | POA: Diagnosis not present

## 2019-05-23 DIAGNOSIS — M791 Myalgia, unspecified site: Secondary | ICD-10-CM

## 2019-05-23 DIAGNOSIS — J449 Chronic obstructive pulmonary disease, unspecified: Secondary | ICD-10-CM | POA: Diagnosis not present

## 2019-05-23 NOTE — Telephone Encounter (Signed)
Spoke with patient daughter and advise her that medication is already and the pharmacy and ready for pick up.

## 2019-05-23 NOTE — Telephone Encounter (Signed)
Medication Refill - Medication: DULoxetine (CYMBALTA) 20 MG capsule     Preferred Pharmacy (with phone number or street name):  [name]:  [phone] Fax:  [phone]      Agent: Please be advised that RX refills may take up to 3 business days. We ask that you follow-up with your pharmacy.

## 2019-05-24 ENCOUNTER — Ambulatory Visit: Payer: Self-pay

## 2019-05-24 ENCOUNTER — Telehealth: Payer: Self-pay

## 2019-05-24 DIAGNOSIS — E1165 Type 2 diabetes mellitus with hyperglycemia: Secondary | ICD-10-CM | POA: Diagnosis not present

## 2019-05-24 DIAGNOSIS — Z794 Long term (current) use of insulin: Secondary | ICD-10-CM | POA: Diagnosis not present

## 2019-05-24 DIAGNOSIS — E669 Obesity, unspecified: Secondary | ICD-10-CM | POA: Diagnosis not present

## 2019-05-24 DIAGNOSIS — E782 Mixed hyperlipidemia: Secondary | ICD-10-CM | POA: Diagnosis not present

## 2019-05-24 DIAGNOSIS — E11649 Type 2 diabetes mellitus with hypoglycemia without coma: Secondary | ICD-10-CM | POA: Diagnosis not present

## 2019-05-24 DIAGNOSIS — I10 Essential (primary) hypertension: Secondary | ICD-10-CM | POA: Diagnosis not present

## 2019-05-24 DIAGNOSIS — E1142 Type 2 diabetes mellitus with diabetic polyneuropathy: Secondary | ICD-10-CM | POA: Diagnosis not present

## 2019-05-24 NOTE — Chronic Care Management (AMB) (Signed)
  Chronic Care Management   Outreach Note  05/24/2019 Name: [name] MRN: [id number] DOB: [birth year]  Primary Care Provider: Hubbard Hartshorn, [name] Reason for referral : Chronic Care Management    Ms. Douglas was previously engaged with the chronic care management team.  A new referral was submitted on 02/21/19 to assist with chronic care management and care coordination.  The team has made several outreach attempts since that time. Her primary care provider will be notified of our unsuccessful attempts to reestablish and maintain contact. The care management team will gladly outreach at any time in the future if Ms. Kalmar is interested in receiving assistance.   Follow Up Plan:  The care management team will gladly follow up with Ms. Gradilla after the primary care provider has a conversation with her regarding recommendation for care management engagement and subsequent re-referral for care management services.   [facility] Care Management [phone]

## 2019-05-26 DIAGNOSIS — I110 Hypertensive heart disease with heart failure: Secondary | ICD-10-CM | POA: Diagnosis not present

## 2019-05-26 DIAGNOSIS — I69354 Hemiplegia and hemiparesis following cerebral infarction affecting left non-dominant side: Secondary | ICD-10-CM | POA: Diagnosis not present

## 2019-05-26 DIAGNOSIS — I2510 Atherosclerotic heart disease of native coronary artery without angina pectoris: Secondary | ICD-10-CM | POA: Diagnosis not present

## 2019-05-26 DIAGNOSIS — H548 Legal blindness, as defined in USA: Secondary | ICD-10-CM | POA: Diagnosis not present

## 2019-05-26 DIAGNOSIS — E1140 Type 2 diabetes mellitus with diabetic neuropathy, unspecified: Secondary | ICD-10-CM | POA: Diagnosis not present

## 2019-05-26 DIAGNOSIS — I5032 Chronic diastolic (congestive) heart failure: Secondary | ICD-10-CM | POA: Diagnosis not present

## 2019-05-26 DIAGNOSIS — M1990 Unspecified osteoarthritis, unspecified site: Secondary | ICD-10-CM | POA: Diagnosis not present

## 2019-05-26 DIAGNOSIS — E11319 Type 2 diabetes mellitus with unspecified diabetic retinopathy without macular edema: Secondary | ICD-10-CM | POA: Diagnosis not present

## 2019-05-26 DIAGNOSIS — J449 Chronic obstructive pulmonary disease, unspecified: Secondary | ICD-10-CM | POA: Diagnosis not present

## 2019-05-30 ENCOUNTER — Other Ambulatory Visit: Payer: Self-pay

## 2019-05-30 ENCOUNTER — Emergency Department
Admission: EM | Admit: 2019-05-30 | Discharge: 2019-05-30 | Disposition: A | Discharge status: Home / Self Care | Payer: Medicare HMO | Attending: Emergency Medicine | Admitting: Emergency Medicine

## 2019-05-30 ENCOUNTER — Ambulatory Visit: Payer: Medicare HMO | Admitting: Gastroenterology

## 2019-05-30 ENCOUNTER — Ambulatory Visit: Payer: Self-pay | Admitting: *Deleted

## 2019-05-30 ENCOUNTER — Encounter: Payer: Self-pay | Admitting: Gastroenterology

## 2019-05-30 DIAGNOSIS — E119 Type 2 diabetes mellitus without complications: Secondary | ICD-10-CM | POA: Diagnosis not present

## 2019-05-30 DIAGNOSIS — Z87891 Personal history of nicotine dependence: Secondary | ICD-10-CM | POA: Diagnosis not present

## 2019-05-30 DIAGNOSIS — E1140 Type 2 diabetes mellitus with diabetic neuropathy, unspecified: Secondary | ICD-10-CM | POA: Diagnosis not present

## 2019-05-30 DIAGNOSIS — Z8673 Personal history of transient ischemic attack (TIA), and cerebral infarction without residual deficits: Secondary | ICD-10-CM | POA: Diagnosis not present

## 2019-05-30 DIAGNOSIS — M1990 Unspecified osteoarthritis, unspecified site: Secondary | ICD-10-CM | POA: Diagnosis not present

## 2019-05-30 DIAGNOSIS — H548 Legal blindness, as defined in USA: Secondary | ICD-10-CM | POA: Diagnosis not present

## 2019-05-30 DIAGNOSIS — I5032 Chronic diastolic (congestive) heart failure: Secondary | ICD-10-CM | POA: Diagnosis not present

## 2019-05-30 DIAGNOSIS — I10 Essential (primary) hypertension: Secondary | ICD-10-CM | POA: Insufficient documentation

## 2019-05-30 DIAGNOSIS — E11319 Type 2 diabetes mellitus with unspecified diabetic retinopathy without macular edema: Secondary | ICD-10-CM | POA: Diagnosis not present

## 2019-05-30 DIAGNOSIS — I110 Hypertensive heart disease with heart failure: Secondary | ICD-10-CM | POA: Diagnosis not present

## 2019-05-30 DIAGNOSIS — Z794 Long term (current) use of insulin: Secondary | ICD-10-CM | POA: Diagnosis not present

## 2019-05-30 DIAGNOSIS — I2510 Atherosclerotic heart disease of native coronary artery without angina pectoris: Secondary | ICD-10-CM | POA: Diagnosis not present

## 2019-05-30 DIAGNOSIS — I69354 Hemiplegia and hemiparesis following cerebral infarction affecting left non-dominant side: Secondary | ICD-10-CM | POA: Diagnosis not present

## 2019-05-30 DIAGNOSIS — J449 Chronic obstructive pulmonary disease, unspecified: Secondary | ICD-10-CM | POA: Insufficient documentation

## 2019-05-30 LAB — CBC
HCT: 38.1 % (ref 36.0–46.0)
Hemoglobin: 12.4 g/dL (ref 12.0–15.0)
MCH: 29.8 pg (ref 26.0–34.0)
MCHC: 32.5 g/dL (ref 30.0–36.0)
MCV: 91.6 fL (ref 80.0–100.0)
Platelets: 283 10*3/uL (ref 150–400)
RBC: 4.16 MIL/uL (ref 3.87–5.11)
RDW: 13.7 % (ref 11.5–15.5)
WBC: 6.2 10*3/uL (ref 4.0–10.5)
nRBC: 0 % (ref 0.0–0.2)

## 2019-05-30 LAB — BASIC METABOLIC PANEL
Anion gap: 8 (ref 5–15)
BUN: 18 mg/dL (ref 6–20)
CO2: 27 mmol/L (ref 22–32)
Calcium: 9.2 mg/dL (ref 8.9–10.3)
Chloride: 107 mmol/L (ref 98–111)
Creatinine, Ser: 0.83 mg/dL (ref 0.44–1.00)
GFR calc Af Amer: >60 mL/min (ref >60)
GFR calc non Af Amer: >60 mL/min (ref >60)
Glucose, Bld: 128 mg/dL — ABNORMAL HIGH (ref 70–99)
Potassium: 3.5 mmol/L (ref 3.5–5.1)
Sodium: 142 mmol/L (ref 135–145)

## 2019-05-30 LAB — TROPONIN I (HIGH SENSITIVITY): Troponin I (High Sensitivity): 5 ng/L (ref <18)

## 2019-05-30 MED ORDER — SODIUM CHLORIDE 0.9% FLUSH: 3.0000 mL | Freq: Once | INTRAVENOUS | Status: DC

## 2019-05-30 MED ORDER — LOSARTAN POTASSIUM 100 MG PO TABS
100.0000 mg | ORAL_TABLET | Freq: Every day | ORAL | 11 refills | Status: DC
Start: 2019-05-30 — End: 2019-06-01

## 2019-05-30 MED ORDER — LOSARTAN POTASSIUM 50 MG PO TABS
50.0000 mg | ORAL_TABLET | Freq: Once | ORAL | Status: AC
  Administered 2019-05-30: 50 mg via ORAL
  Filled 2019-05-30: qty 1

## 2019-05-30 MED ORDER — CLONIDINE HCL 0.1 MG PO TABS: 0.1000 mg | ORAL_TABLET | Freq: Once | ORAL | Status: DC

## 2019-05-30 NOTE — ED Provider Notes (Signed)
[facility] Emergency Department Provider Note       Time seen: ----------------------------------------- 4:33 PM on 05/30/2019 -----------------------------------------   I have reviewed the triage vital signs and the nursing notes.  HISTORY   Chief Complaint Hypertension    HPI [name] is a 54 y.o. female with a history of anxiety, depression, asthma, COPD, diabetes, hyperlipidemia, hypertension, CVA who presents to the ED for hypertension.  Patient reports headache, dizziness and bilateral leg pain.  She was encouraged to come here by Occupational Therapy.  Denies any chest pain.  Discomfort is 9 out of 10.  Past Medical History:  Diagnosis Date  . Anxiety and depression 09/13/2006   Qualifier: Diagnosis of  By: Hassell Done [name], Tori Milks    . Arthritis    joint pain   . Asthma   . COPD (chronic obstructive pulmonary disease) (HCC)    chronic bronchitis   . Depression   . Diabetes mellitus    since age 37; type 2 IDDM  . Diabetic neuropathy, painful ([place])    FEET AND HANDS  . Diabetic retinopathy ([place])   . Diastolic dysfunction    a.  Echo 11/17: EF 60-65%, mild LVH, no RWMA, Gr1DD, mild AI, dilated aortic root measuring 38 mm, mildly dilated ascending aorta  . Dilated aortic root ([place])    58m by echo 11/2015  . Dizziness    secondary to diabetes and hypertension   . Epilepsy idiopathic petit mal ([place]    last seizure 2012;controlled w/ topomax  . GERD (gastroesophageal reflux disease)   . Glaucoma    NOT ON ANY EYE DROPS   . Headache(784.0)    migraines   . Heart murmur    born with   . History of stress test    a. 11/17: Normal perfusion, EF 53%, normal study  . Hx of blood clots    hematomas removed from left side of brain from 13moto [id number]   . Hyperlipidemia   . Hypertension   . Legally blind    left eye   . MIGRAINE HEADACHE 09/14/2006   Qualifier: Diagnosis of  By: MarHassell DoneP, NykTori Milks . Myocardial infarction  ([place]CC[place]  a.  Patient reported history of without objective documentation  . Nephrolithiasis    frequent urination , urination at nite  PT SEEN IN ER 09/22/11 FOR BACK AND RT SIDED PAIN--HAS KNOWN STONE RT URETER AND UA IN ER SHOWED UTI  . Pain 09/23/11   LOWER BACK AND RIGHT SIDE--PT HAS RIGHT URETERAL STONE  . Pneumonia    hx of 2009  . Pyelonephritis   . Seizures (HCC[place] . Sleep apnea    [facility] ; mild  . Stress incontinence   . [facility]  last [facility]  RESIDUAL LEFT LEG WEAKNESS--NO OTHER RESIDUAL PROBLEMS    Patient Active Problem List   Diagnosis Date Noted  . Hypoglycemia 05/17/2019  . Hypoglycemia associated with type 2 diabetes mellitus (HCC[place]igeon [place]4/27/2021  . Chest pain 04/27/2019  . Type 2 diabetes mellitus without complication, with long-term current use of insulin (HCCT[place]4/08/2019  . COPD (chronic obstructive pulmonary disease) (HCCMar[place]4/08/2019  . CAD (coronary artery disease) 04/27/2019  . H/O: stroke with residual effects   . Myalgia 04/19/2019  . 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 Non-intractable vomiting 03/01/2019  . History of CVA (cerebrovascular accident) 07/08/2018  . History of seizure 07/08/2018  . Diabetic  polyneuropathy associated with type 2 diabetes mellitus ([place]) 03/02/2018  . Obesity (BMI 30-39.9) 03/02/2018  . Vitamin D deficiency 01/24/2018  . Vulvovaginal candidiasis 01/24/2018  . Hypokalemia 06/08/2017  . Overactive bladder 05/18/2017  . Asthma 06/22/2016  . Dilated aortic root ([place])   . Sacroiliac dysfunction 10/09/2014  . Chronic low back pain 09/03/2014  . CVA, old, hemiparesis ([place]) 09/03/2014  . OSA (obstructive sleep apnea) 05/15/2014  . Restrictive airway disease 05/15/2014  . Colitis 02/22/2014  . Type 2 diabetes, uncontrolled, with renal manifestation ([place]) 01/21/2014  . Chronic pain syndrome 01/21/2014  . Headache disorder 09/07/2013  . Diabetic neuropathy ([place]) 05/23/2013  . Mixed urge and  stress incontinence 08/25/2011  . Ureteral stricture, right 08/25/2011  . Pyelonephritis 08/22/2011  . Nephrolithiasis 08/22/2011  . Incisional hernia 05/10/2011  . Morbid obesity ([place]) 05/10/2011  . Tachycardia 05/08/2011  . BLINDNESS, [place], [place] DEFINITION 09/14/2006  . Hyperlipemia 09/13/2006  . Essential hypertension 09/13/2006  . MYOCARDIAL INFARCTION, HX OF 09/13/2006  . GERD 09/13/2006  . Seizure disorder ([place]) 09/13/2006    Past Surgical History:  Procedure Laterality Date  . BRAIN HEMATOMA EVACUATION     five procedures total, first procedure when 38 months old, last at 54 years of age.  . CYSTOSCOPY W/ RETROGRADES  09/24/2011   Procedure: CYSTOSCOPY WITH RETROGRADE PYELOGRAM;  Surgeon: Malka So, MD;  Location: [facility];  Service: Urology;  Laterality: Right;  . CYSTOSCOPY WITH URETEROSCOPY  09/24/2011   Procedure: CYSTOSCOPY WITH URETEROSCOPY;  Surgeon: Malka So, MD;  Location: [facility];  Service: Urology;  Laterality: Right;  Balloon dilation right ureter   . CYSTOSCOPY/RETROGRADE/URETEROSCOPY  08/24/2011   Procedure: CYSTOSCOPY/RETROGRADE/URETEROSCOPY;  Surgeon: Molli Hazard, MD;  Location: [facility];  Service: Urology;  Laterality: Right;  Cysto, Right retrograde Pyelogram, right stent placement.   Fatima Blank HERNIA REPAIR  05/05/2011   Procedure: LAPAROSCOPIC INCISIONAL HERNIA;  Surgeon: Rolm Bookbinder, MD;  Location: [facility];  Service: General;  Laterality: N/A;  . kidney stone removal    . MASS EXCISION  05/05/2011   Procedure: EXCISION MASS;  Surgeon: Rolm Bookbinder, MD;  Location: [facility];  Service: General;  Laterality: Right;  . PCNL    . RETINAL DETACHMENT SURGERY Left 1990  . RIGHT/LEFT HEART CATH AND CORONARY ANGIOGRAPHY N/A 04/28/2019   Procedure: RIGHT/LEFT HEART CATH AND CORONARY ANGIOGRAPHY;  Surgeon: Yolonda Kida, MD;  Location: [facility];  Service: Cardiovascular;  Laterality: N/A;  . SHUNT REMOVAL     shunt inserted at age 54  removed at age 89   . VAGINAL HYSTERECTOMY  1996    Allergies Codeine and Lactose intolerance (gi)  Social History Social History   Tobacco Use  . Smoking status: Former Smoker    Packs/day: 1.00    Years: 15.00    Pack years: 15.00    Types: Cigarettes    Quit date: 01/20/1987    Years since quitting: 32.3  . Smokeless tobacco: Never Used  . Tobacco comment: quit more than 20 years  Substance Use Topics  . Alcohol use: Never    Alcohol/week: 0.0 standard drinks  . Drug use: No    Comment: hx of marijuana use no longer uses     Review of Systems Constitutional: Negative for fever. Cardiovascular: Negative for chest pain. Respiratory: Negative for shortness of breath. Gastrointestinal: Negative for abdominal pain, vomiting and diarrhea. Musculoskeletal: Negative for back pain.  Positive for leg pain Skin: Negative for rash. Neurological: Positive  for headache, dizziness  All systems negative/normal/unremarkable except as stated in the HPI  ____________________________________________   PHYSICAL EXAM:  VITAL SIGNS: ED Triage Vitals  Enc Vitals Group     BP 05/30/19 1509 (!) 188/114     Pulse Rate 05/30/19 1509 94     Resp 05/30/19 1509 18     Temp 05/30/19 1509 98.8 F (37.1 C)     Temp Source 05/30/19 1509 Oral     SpO2 05/30/19 1509 95 %     Weight 05/30/19 1510 223 lb (101.2 kg)     Height 05/30/19 1510 5' 6"  (1.676 m)     Head Circumference --      Peak Flow --      Pain Score 05/30/19 1534 9     Pain Loc --      Pain Edu? --      Excl. in [place]? --     Constitutional: Alert and oriented. Well appearing and in no distress. Eyes: Conjunctivae are normal. Normal extraocular movements. Cardiovascular: Normal rate, regular rhythm. No murmurs, rubs, or gallops. Respiratory: Normal respiratory effort without tachypnea nor retractions. Breath sounds are clear and equal bilaterally. No wheezes/rales/rhonchi. Gastrointestinal: Soft and nontender. Normal bowel  sounds Musculoskeletal: Nontender with normal range of motion in extremities. No lower extremity tenderness nor edema. Neurologic:  Normal speech and language. No gross focal neurologic deficits are appreciated.  Skin:  Skin is warm, dry and intact. No rash noted. Psychiatric: Mood and affect are normal. Speech and behavior are normal.  ____________________________________________  EKG: Interpreted by me.  Sinus rhythm with rate of 87 bpm, left axis deviation, normal QT  ____________________________________________  ED COURSE:  As part of my medical decision making, I reviewed the following data within the [place] History obtained from family if available, nursing notes, old chart and ekg, as well as notes from prior ED visits. Patient presented for hypertension, we will assess with labs as indicated at this time.   Procedures  Charletta Voight was evaluated in Emergency Department on 05/30/2019 for the symptoms described in the history of present illness. She was evaluated in the context of the global COVID-19 pandemic, which necessitated consideration that the patient might be at risk for infection with the SARS-CoV-2 virus that causes COVID-19. Institutional protocols and algorithms that pertain to the evaluation of patients at risk for COVID-19 are in a state of rapid change based on information released by regulatory bodies including the CDC and federal and state organizations. These policies and algorithms were followed during the patient's care in the ED.  ____________________________________________   LABS (pertinent positives/negatives)  Labs Reviewed  BASIC METABOLIC PANEL - Abnormal; Notable for the following components:      Result Value   Glucose, Bld 128 (*)    All other components within normal limits  CBC  POC URINE PREG, ED  TROPONIN I (HIGH SENSITIVITY)   ___________________________________________   DIFFERENTIAL DIAGNOSIS   Essential  hypertension, dizziness, chronic pain, dehydration, electrolyte abnormality, renal failure, MI  FINAL ASSESSMENT AND PLAN  Hypertension   Plan: The patient had presented for elevated blood pressure. Patient's labs were reassuring.  She does not have any signs of endorgan dysfunction at this time.  I will increase her Cozaar from 50-100.  I will advise close outpatient follow-up with her doctor.   Laurence Aly, MD    Note: This note was generated in part or whole with voice recognition software. Voice recognition is usually quite  accurate but there are transcription errors that can and very often do occur. I apologize for any typographical errors that were not detected and corrected.     Earleen Newport, MD 05/30/19 [phone]

## 2019-05-30 NOTE — ED Notes (Signed)
md aware of bp and ok for discharge

## 2019-05-30 NOTE — Telephone Encounter (Signed)
Elevated BP- L arm 181/114 R arm 172/115  2:05 Lightheaded Per protocol patient needs to go back to ED  Reason for Disposition . [9] Systolic BP  >= 539 OR Diastolic >= 672 AND [8] cardiac or neurologic symptoms (e.g., chest pain, difficulty breathing, unsteady gait, blurred vision)  Answer Assessment - Initial Assessment Questions 1. BLOOD PRESSURE: "What is the blood pressure?" "Did you take at least two measurements 5 minutes apart?"     181/114, 172/115 2. ONSET: "When did you take your blood pressure?"     Today- 2:05 3. HOW: "How did you obtain the blood pressure?" (e.g., visiting nurse, automatic home BP monitor)     Automatic cuff 4. HISTORY: "Do you have a history of high blood pressure?"     yes 5. MEDICATIONS: "Are you taking any medications for blood pressure?" "Have you missed any doses recently?"     Yes- no missed doses 6. OTHER SYMPTOMS: "Do you have any symptoms?" (e.g., headache, chest pain, blurred vision, difficulty breathing, weakness)     lightheaded 7. PREGNANCY: "Is there any chance you are pregnant?" "When was your last menstrual period?"     n/a  Protocols used: HIGH BLOOD PRESSURE-A-AH

## 2019-05-30 NOTE — ED Triage Notes (Signed)
Pt arrives POV from home after being told to come to the ED by occupational therapist due to HTN. Pt reports headache, dizziness and bilateral leg pain. Pt appears to be in NAD and is A&Ox4. Reports no chest pain.

## 2019-05-31 NOTE — Telephone Encounter (Signed)
Patient went to ER at [facility] and they increased her Losartan. They picked up script today. Appointment 5/13 at 2:20 pm here in office

## 2019-06-01 ENCOUNTER — Encounter: Payer: Self-pay | Admitting: Family Medicine

## 2019-06-01 ENCOUNTER — Ambulatory Visit (INDEPENDENT_AMBULATORY_CARE_PROVIDER_SITE_OTHER): Payer: Medicare HMO | Admitting: Family Medicine

## 2019-06-01 ENCOUNTER — Other Ambulatory Visit: Payer: Self-pay

## 2019-06-01 VITALS — BP 140/88 | HR 83 | Temp 98.1°F | Resp 14 | Ht 66.0 in | Wt 240.5 lb

## 2019-06-01 DIAGNOSIS — E11649 Type 2 diabetes mellitus with hypoglycemia without coma: Secondary | ICD-10-CM

## 2019-06-01 DIAGNOSIS — Z09 Encounter for follow-up examination after completed treatment for conditions other than malignant neoplasm: Secondary | ICD-10-CM | POA: Diagnosis not present

## 2019-06-01 DIAGNOSIS — I10 Essential (primary) hypertension: Secondary | ICD-10-CM | POA: Diagnosis not present

## 2019-06-01 MED ORDER — POLYETHYLENE GLYCOL 3350 17 G PO PACK: 17.0000 g | PACK | Freq: Every day | ORAL | 3 refills | Status: DC | PRN

## 2019-06-01 MED ORDER — LOSARTAN POTASSIUM 100 MG PO TABS
100.0000 mg | ORAL_TABLET | Freq: Every day | ORAL | 3 refills | Status: DC
Start: 2019-06-01 — End: 2021-01-10

## 2019-06-01 NOTE — Progress Notes (Signed)
Patient ID: [name], female    DOB: 1965/07/05, 54 y.o.   MRN: [id number]  PCP: Delsa Grana, [name]  Chief Complaint  Patient presents with  . Hospitalization Follow-up  . Hypertension  . Diabetes    already saw Endo    Subjective:   [name] is a 54 y.o. female, presents to clinic with CC of the following:  HPI  Pt presents for hospital f/up/transition of care visit after admission from 05/16/2019-05/21/2019 for hypoglycemia  Here for hospital follow up/transition of care.  Admit date: 05/16/2019 Discharge date: 05/21/2019 Transition of care was initiated previously by [name] on 05/22/19 and med changes, diagnosis, specialist follow ups and pts symptoms and condition were all reviewed.  Pt was admitted for T2DM with hypoglycemia New medications started per hospitalization - hold on trulicity and toujeo until f/up with endocrinology Labs due today are - none - per endo Pt feels good, blood sugars are much better -   insulin glargine (2 Unit Dial) 300 UNIT/ML Solostar Pen Commonly known as: TOUJEO MAX Hold your long-acting until followup with your outpatient endocrinology provider. What changed:   how much to take  how to take this  when to take this  additional instructions    Pt also presents for f/up of HTN and ER visit  Went to the ER 05/30/2019 for hypertension - was doing home PT and BP was elevated to 174/115 R arm 172/115 right arm 181/114 left arm Pt presented to the hospital with BP 188/114 No signs of end organ damage, EKG was done, labs were unremarkable, losartan dose was increased for 50  To 100 mg daily.  Today BP is 140/88 She feels good, no SE with taking the higher dose of losartan - she has only taken for 2-3 days.   Patient Active Problem List   Diagnosis Date Noted  . Hypoglycemia 05/17/2019  . Hypoglycemia associated with type 2 diabetes mellitus ([place]) 05/16/2019  . Chest pain 04/27/2019  . Type 2 diabetes  mellitus without complication, with long-term current use of insulin ([place]) 04/27/2019  . COPD (chronic obstructive pulmonary disease) ([place]) 04/27/2019  . CAD (coronary artery disease) 04/27/2019  . H/O: stroke with residual effects   . Myalgia 04/19/2019  . 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 Non-intractable vomiting 03/01/2019  . History of CVA (cerebrovascular accident) 07/08/2018  . History of seizure 07/08/2018  . Diabetic polyneuropathy associated with type 2 diabetes mellitus ([place]) 03/02/2018  . Obesity (BMI 30-39.9) 03/02/2018  . Vitamin D deficiency 01/24/2018  . Vulvovaginal candidiasis 01/24/2018  . Hypokalemia 06/08/2017  . Overactive bladder 05/18/2017  . Asthma 06/22/2016  . Dilated aortic root ([place])   . Sacroiliac dysfunction 10/09/2014  . Chronic low back pain 09/03/2014  . CVA, old, hemiparesis ([place]) 09/03/2014  . OSA (obstructive sleep apnea) 05/15/2014  . Restrictive airway disease 05/15/2014  . Colitis 02/22/2014  . Type 2 diabetes, uncontrolled, with renal manifestation ([place]) 01/21/2014  . Chronic pain syndrome 01/21/2014  . Headache disorder 09/07/2013  . Diabetic neuropathy ([place]) 05/23/2013  . Mixed urge and stress incontinence 08/25/2011  . Ureteral stricture, right 08/25/2011  . Pyelonephritis 08/22/2011  . Nephrolithiasis 08/22/2011  . Incisional hernia 05/10/2011  . Morbid obesity ([place]) 05/10/2011  . Tachycardia 05/08/2011  . BLINDNESS, [place], [place] DEFINITION 09/14/2006  . Hyperlipemia 09/13/2006  . Essential hypertension 09/13/2006  . MYOCARDIAL INFARCTION, HX OF 09/13/2006  . GERD 09/13/2006  . Seizure disorder ([place]) 09/13/2006  Current Outpatient Medications:  .  albuterol (ACCUNEB) 1.25 MG/3ML nebulizer solution, Inhale into the lungs. Use 3 mls by nebulization every 6 hours as needed for wheezing, Disp: , Rfl:  .  albuterol (PROAIR HFA) 108 (90 Base) MCG/ACT inhaler, INHALE 2 PUFFS BY MOUTH EVERY 6 HOURS AS NEEDED FOR WHEEZING OR  SHORTNESS OF BREATH, Disp: 3 Inhaler, Rfl: 3 .  budesonide (PULMICORT) 0.5 MG/2ML nebulizer solution, Inhale 0.5 mg into the lungs daily. Take 2 mls by nebulizations once daily , Disp: , Rfl:  .  Calcium Carbonate-Vitamin D (CALCIUM-VITAMIN D3 PO), D3 500- calcium 630 mg Take 1 capsule by mouth once daily, Disp: , Rfl:  .  carvedilol (COREG) 12.5 MG tablet, Take 1 tablet (12.5 mg total) by mouth 2 (two) times daily after a meal., Disp: 60 tablet, Rfl: 2 .  clopidogrel (PLAVIX) 75 MG tablet, Take 1 tablet (75 mg total) by mouth daily., Disp: 90 tablet, Rfl: 0 .  cycloSPORINE (RESTASIS) 0.05 % ophthalmic emulsion, Place 2 drops into both eyes 2 (two) times daily., Disp: , Rfl:  .  Dulaglutide (TRULICITY) 1.5 EQ/6.8TM SOPN, Inject 1.5 mg into the skin once a week., Disp: , Rfl:  .  DULoxetine (CYMBALTA) 20 MG capsule, Take 2 capsules q am and 1 capsule q pm, Disp: 90 capsule, Rfl: 2 .  DUPIXENT 300 MG/2ML SOPN, Inject 300 mg into the skin every 14 (fourteen) days. , Disp: , Rfl:  .  EPINEPHrine 0.3 mg/0.3 mL IJ SOAJ injection, Inject 0.3 mg into the muscle as needed for anaphylaxis. , Disp: , Rfl:  .  ezetimibe (ZETIA) 10 MG tablet, Take 1 tablet (10 mg total) by mouth daily., Disp: 90 tablet, Rfl: 3 .  fluticasone (FLONASE) 50 MCG/ACT nasal spray, Place 2 sprays into both nostrils daily., Disp: 16 g, Rfl: 3 .  Fluticasone-Salmeterol (WIXELA INHUB) 100-50 MCG/DOSE AEPB, Inhale 1 puff into the lungs 2 (two) times daily., Disp: , Rfl:  .  gabapentin (NEURONTIN) 400 MG capsule, 400 mg qAM, 400 mg qPM, 800 mg qhs, Disp: 120 capsule, Rfl: 2 .  insulin aspart (NOVOLOG FLEXPEN) 100 UNIT/ML FlexPen, Inject into the skin 3 (three) times daily with meals. If blood sugar is under 150 hold, sliding scale per endo, Disp: , Rfl:  .  insulin glargine, 2 Unit Dial, (TOUJEO MAX) 300 UNIT/ML Solostar Pen, Hold your long-acting until followup with your outpatient endocrinology provider. (Patient taking differently: Inject  80 Units into the skin daily. ), Disp: 15 mL, Rfl: 1 .  levETIRAcetam (KEPPRA) 500 MG tablet, Take 500 mg by mouth 2 (two) times daily. 1 TAB IN THE MORNING AND .5 TABS AT NIGHT, Disp: , Rfl:  .  losartan (COZAAR) 100 MG tablet, Take 1 tablet (100 mg total) by mouth daily., Disp: 30 tablet, Rfl: 11 .  mirabegron ER (MYRBETRIQ) 50 MG TB24 tablet, Take 1 tablet (50 mg total) by mouth daily., Disp: 90 tablet, Rfl: 1 .  ondansetron (ZOFRAN ODT) 4 MG disintegrating tablet, Take 1 tablet (4 mg total) by mouth every 8 (eight) hours as needed for nausea or vomiting., Disp: 20 tablet, Rfl: 1 .  pantoprazole (PROTONIX) 40 MG tablet, Take 1 tablet (40 mg total) by mouth daily., Disp: 90 tablet, Rfl: 0 .  polyethylene glycol (MIRALAX / GLYCOLAX) packet, Take 17 g by mouth daily as needed for mild constipation., Disp: 100 each, Rfl: 3 .  potassium chloride SA (KLOR-CON M20) 20 MEQ tablet, You have not needed potassium while in the hospital.  Hold until outpatient followup., Disp: 60 tablet, Rfl: 5 .  simvastatin (ZOCOR) 40 MG tablet, Take 40 mg by mouth daily., Disp: , Rfl:  .  spironolactone (ALDACTONE) 25 MG tablet, Take 1 tablet by mouth daily., Disp: , Rfl:  .  terconazole (TERAZOL 7) 0.4 % vaginal cream, Place 1 applicator vaginally at bedtime as needed (irritation)., Disp: , Rfl:  .  nitroGLYCERIN (NITROSTAT) 0.4 MG SL tablet, Place 1 tablet (0.4 mg total) under the tongue every 5 (five) minutes as needed for chest pain., Disp: 90 tablet, Rfl: 12   Allergies  Allergen Reactions  . Codeine Swelling and Other (See Comments)    Swelling and burning of mouth (inside)  . Lactose Intolerance (Gi) Nausea And Vomiting     Family History  Problem Relation Age of Onset  . Uterine cancer Mother   . Hypertension Mother   . Cancer Mother   . Brain cancer Maternal Grandmother   . Hypertension Maternal Grandmother   . Birth defects Daughter   . Hypertension Daughter   . Cirrhosis Maternal Grandfather   .  ADD / ADHD Maternal Grandfather   . Birth defects Maternal Grandfather   . Diabetes Maternal Grandfather   . Anesthesia problems Neg Hx   . Colon cancer Neg Hx   . Esophageal cancer Neg Hx   . Pancreatic cancer Neg Hx      Social History   Socioeconomic History  . Marital status: Widowed    Spouse name: Not on file  . Number of children: 2  . Years of education: Not on file  . Highest education level: Not on file  Occupational History  . Occupation: disabled  Tobacco Use  . Smoking status: Former Smoker    Packs/day: 1.00    Years: 15.00    Pack years: 15.00    Types: Cigarettes    Quit date: 01/20/1987    Years since quitting: 32.3  . Smokeless tobacco: Never Used  . Tobacco comment: quit more than 20 years  Substance and Sexual Activity  . Alcohol use: Never    Alcohol/week: 0.0 standard drinks  . Drug use: No    Comment: hx of marijuana use no longer uses   . Sexual activity: Yes    Partners: Male    Birth control/protection: Surgical  Other Topics Concern  . Not on file  Social History Narrative  . Not on file   Social Determinants of Health   Financial Resource Strain: Low Risk   . Difficulty of Paying Living Expenses: Not very hard  Food Insecurity: No Food Insecurity  . Worried About Charity fundraiser in the Last Year: Never true  . Ran Out of Food in the Last Year: Never true  Transportation Needs: No Transportation Needs  . Lack of Transportation (Medical): No  . Lack of Transportation (Non-Medical): No  Physical Activity: Insufficiently Active  . Days of Exercise per Week: 2 days  . Minutes of Exercise per Session: 20 min  Stress: No Stress Concern Present  . Feeling of Stress : Only a little  Social Connections: Slightly Isolated  . Frequency of Communication with Friends and Family: More than three times a week  . Frequency of Social Gatherings with Friends and Family: More than three times a week  . Attends Religious Services: More than 4  times per year  . Active Member of Clubs or Organizations: Yes  . Attends Archivist Meetings: More than 4 times per year  . Marital  Status: Widowed  Intimate Partner Violence: Not At Risk  . Fear of Current or Ex-Partner: No  . Emotionally Abused: No  . Physically Abused: No  . Sexually Abused: No    Chart Review Today: I personally reviewed active problem list, medication list, allergies, family history, social history, health maintenance, notes from last encounter, lab results, imaging with the patient/caregiver today.   Review of Systems 10 Systems reviewed and are negative for acute change except as noted in the HPI.     Objective:   Vitals:   06/01/19 1443  BP: 140/88  Pulse: 83  Resp: 14  Temp: 98.1 F (36.7 C)  SpO2: 99%  Weight: 240 lb 8 oz (109.1 kg)  Height: 5' 6"  (1.676 m)    Body mass index is 38.82 kg/m.  Physical Exam Constitutional:      General: She is not in acute distress.    Appearance: She is obese. She is not toxic-appearing or diaphoretic.  Cardiovascular:     Rate and Rhythm: Normal rate and regular rhythm.     Pulses: Normal pulses.     Heart sounds: Normal heart sounds.  Pulmonary:     Effort: Pulmonary effort is normal.     Breath sounds: Normal breath sounds.  Abdominal:     General: Bowel sounds are normal.     Palpations: Abdomen is soft.  Neurological:     Mental Status: She is alert. Mental status is at baseline.  Psychiatric:        Mood and Affect: Mood normal.        Behavior: Behavior normal.      Results for orders placed or performed during the hospital encounter of 33/29/51  Basic metabolic panel  Result Value Ref Range   Sodium 142 135 - 145 mmol/L   Potassium 3.5 3.5 - 5.1 mmol/L   Chloride 107 98 - 111 mmol/L   CO2 27 22 - 32 mmol/L   Glucose, Bld 128 (H) 70 - 99 mg/dL   BUN 18 6 - 20 mg/dL   Creatinine, Ser 0.83 0.44 - 1.00 mg/dL   Calcium 9.2 8.9 - 10.3 mg/dL   GFR calc non Af Amer >60 >60 mL/min    GFR calc Af Amer >60 >60 mL/min   Anion gap 8 5 - 15  CBC  Result Value Ref Range   WBC 6.2 4.0 - 10.5 K/uL   RBC 4.16 3.87 - 5.11 MIL/uL   Hemoglobin 12.4 12.0 - 15.0 g/dL   HCT 38.1 36.0 - 46.0 %   MCV 91.6 80.0 - 100.0 fL   MCH 29.8 26.0 - 34.0 pg   MCHC 32.5 30.0 - 36.0 g/dL   RDW 13.7 11.5 - 15.5 %   Platelets 283 150 - 400 K/uL   nRBC 0.0 0.0 - 0.2 %  Troponin I (High Sensitivity)  Result Value Ref Range   Troponin I (High Sensitivity) 5 <18 ng/L        Assessment & Plan:      ICD-10-CM   1. Essential hypertension  I10    continue losartan 100 mg daily (take 2 tabs of losartan 50) refill at 100 mg daily dose sent in, f/up in 2 weeks to monitor BP control  Spent a significant amount of time today discussing with the patient hypertensive urgency versus emergency and associated symptoms which would be concerning that I would want her to present to the ER for.  She was given handouts with the listed and annotated symptoms.  I explained to her that while she is doing physical therapy is stressed or in pain she may likely experience higher blood pressure readings and as long as they are not causing any symptoms and her blood pressure returns to normal within 1 to 2 hours, she does not have to present to the ER.  With history of stroke patient understands that she is at higher risk for an event if her blood pressure suddenly spikes very high, so we will continue the higher dose of losartan and continue to monitor with a goal blood pressure readings 120/60 to < 150/90. Instructions of how to propertly check BP at home were printed and reviewed today She was given a BP log to record readings and f/up in about 2 weeks - when dose change will reflex its effectiveness.   2. Hypoglycemia associated with type 2 diabetes mellitus ([place])  E11.649    reviewed meds, endocrine visits, hospitalization and med changes, No low sugars since d/c from hospital  Per endocrinology:   trulicity  restarted and meds adjusted:  Drema Halon, NP - 05/24/2019 11:45 AM EDT  Formatting of this note might be different from the original. Restart Trulicity 1.5 mg weekly on Sunday  Restart Toujeo 80 units each evening  Continue Novolog sliding scale only: Add 2 units of NovoLog if your sugar is 150-200 Add 4 units of NovoLog if your sugar is 201-250 Add 6 units of NovoLog if your sugar is 251-300 Add 8 units of NovoLog if your sugar is 301-350 Add 10 units of NovoLog if your sugar is 351-400 Add 12 units of NovoLog if your sugar is over 401  Check blood sugar fasting each morning, before lunch and before dinner Please bring meter to all clinic visits  Return to clinic in 6 weeks    3. Encounter for examination following treatment at hospital  Z09 ER visit and admission visits, plus other encounters and transition of care phone call all reviewed today, reviewed with the patient as well, all questions asked and answered        Delsa Grana, [name] 06/01/19 2:56 PM

## 2019-06-01 NOTE — Patient Instructions (Addendum)
Blood Pressure Record Sheet  Blood pressure log Date: _______________________  a.m. _____________________(1st reading) _____________________(2nd reading)  p.m. _____________________(1st reading) _____________________(2nd reading) Date: _______________________  a.m. _____________________(1st reading) _____________________(2nd reading)  p.m. _____________________(1st reading) _____________________(2nd reading) Date: _______________________  a.m. _____________________(1st reading) _____________________(2nd reading)  p.m. _____________________(1st reading) _____________________(2nd reading) Date: _______________________  a.m. _____________________(1st reading) _____________________(2nd reading)  p.m. _____________________(1st reading) _____________________(2nd reading) Date: _______________________  a.m. _____________________(1st reading) _____________________(2nd reading)  p.m. _____________________(1st reading) _____________________(2nd reading) Date: _______________________  a.m. _____________________(1st reading) _____________________(2nd reading)  p.m. _____________________(1st reading) _____________________(2nd reading) Date: _______________________  a.m. _____________________(1st reading) _____________________(2nd reading)  p.m. _____________________(1st reading) _____________________(2nd reading) Date: _______________________  a.m. _____________________(1st reading) _____________________(2nd reading)  p.m. _____________________(1st reading) _____________________(2nd reading) Date: _______________________  a.m. _____________________(1st reading) _____________________(2nd reading)  p.m. _____________________(1st reading) _____________________(2nd reading) Date: _______________________  a.m. _____________________(1st reading) _____________________(2nd reading)  p.m. _____________________(1st reading) _____________________(2nd reading)      How to Take  Your Blood Pressure You can take your blood pressure at home with a machine. You may need to check your blood pressure at home:  To check if you have high blood pressure (hypertension).  To check your blood pressure over time.  To make sure your blood pressure medicine is working. Supplies needed: You will need a blood pressure machine, or monitor. You can buy one at a drugstore or online. When choosing one:  Choose one with an arm cuff.  Choose one that wraps around your upper arm. Only one finger should fit between your arm and the cuff.  Do not choose one that measures your blood pressure from your wrist or finger. Your doctor can suggest a monitor. How to prepare Avoid these things for 30 minutes before checking your blood pressure:  Drinking caffeine.  Drinking alcohol.  Eating.  Smoking.  Exercising. Five minutes before checking your blood pressure:  Pee.  Sit in a dining chair. Avoid sitting in a soft couch or armchair.  Be quiet. Do not talk. How to take your blood pressure Follow the instructions that came with your machine. If you have a digital blood pressure monitor, these may be the instructions: 1. Sit up straight. 2. Place your feet on the floor. Do not cross your ankles or legs. 3. Rest your left arm at the level of your heart. You may rest it on a table, desk, or chair. 4. Pull up your shirt sleeve. 5. Wrap the blood pressure cuff around the upper part of your left arm. The cuff should be 1 inch (2.5 cm) above your elbow. It is best to wrap the cuff around bare skin. 6. Fit the cuff snugly around your arm. You should be able to place only one finger between the cuff and your arm. 7. Put the cord inside the groove of your elbow. 8. Press the power button. 9. Sit quietly while the cuff fills with air and loses air. 10. Write down the numbers on the screen. 11. Wait 2-3 minutes and then repeat steps 1-10. What do the numbers mean? Two numbers make up  your blood pressure. The first number is called systolic pressure. The second is called diastolic pressure. An example of a blood pressure reading is "120 over 80" (or 120/80). If you are an adult and do not have a medical condition, use this guide to find out if your blood pressure is normal: Normal  First number: below 120.  Second number: below 80. Elevated  First number: 120-129.  Second number: below 80. Hypertension stage 1  First  number: 130-139.  Second number: 80-89. Hypertension stage 2  First number: 140 or above.  Second number: 40 or above. Your blood pressure is above normal even if only the top or bottom number is above normal. Follow these instructions at home:  Check your blood pressure as often as your doctor tells you to.  Take your monitor to your next doctor's appointment. Your doctor will: ? Make sure you are using it correctly. ? Make sure it is working right.  Make sure you understand what your blood pressure numbers should be.  Tell your doctor if your medicines are causing side effects. Contact a doctor if:  Your blood pressure keeps being high. Get help right away if:  Your first blood pressure number is higher than 180.  Your second blood pressure number is higher than 120. This information is not intended to replace advice given to you by your health care provider. Make sure you discuss any questions you have with your health care provider. Document Revised: 12/18/2016 Document Reviewed: 06/14/2015 Elsevier Patient Education  2020 [employer].

## 2019-06-02 DIAGNOSIS — I5032 Chronic diastolic (congestive) heart failure: Secondary | ICD-10-CM | POA: Diagnosis not present

## 2019-06-02 DIAGNOSIS — H548 Legal blindness, as defined in USA: Secondary | ICD-10-CM | POA: Diagnosis not present

## 2019-06-02 DIAGNOSIS — E11319 Type 2 diabetes mellitus with unspecified diabetic retinopathy without macular edema: Secondary | ICD-10-CM | POA: Diagnosis not present

## 2019-06-02 DIAGNOSIS — I110 Hypertensive heart disease with heart failure: Secondary | ICD-10-CM | POA: Diagnosis not present

## 2019-06-02 DIAGNOSIS — J449 Chronic obstructive pulmonary disease, unspecified: Secondary | ICD-10-CM | POA: Diagnosis not present

## 2019-06-02 DIAGNOSIS — I69354 Hemiplegia and hemiparesis following cerebral infarction affecting left non-dominant side: Secondary | ICD-10-CM | POA: Diagnosis not present

## 2019-06-02 DIAGNOSIS — I2510 Atherosclerotic heart disease of native coronary artery without angina pectoris: Secondary | ICD-10-CM | POA: Diagnosis not present

## 2019-06-02 DIAGNOSIS — E1140 Type 2 diabetes mellitus with diabetic neuropathy, unspecified: Secondary | ICD-10-CM | POA: Diagnosis not present

## 2019-06-02 DIAGNOSIS — M1990 Unspecified osteoarthritis, unspecified site: Secondary | ICD-10-CM | POA: Diagnosis not present

## 2019-06-04 DIAGNOSIS — J449 Chronic obstructive pulmonary disease, unspecified: Secondary | ICD-10-CM | POA: Diagnosis not present

## 2019-06-07 DIAGNOSIS — H548 Legal blindness, as defined in USA: Secondary | ICD-10-CM | POA: Diagnosis not present

## 2019-06-07 DIAGNOSIS — J449 Chronic obstructive pulmonary disease, unspecified: Secondary | ICD-10-CM | POA: Diagnosis not present

## 2019-06-07 DIAGNOSIS — E1140 Type 2 diabetes mellitus with diabetic neuropathy, unspecified: Secondary | ICD-10-CM | POA: Diagnosis not present

## 2019-06-07 DIAGNOSIS — I69354 Hemiplegia and hemiparesis following cerebral infarction affecting left non-dominant side: Secondary | ICD-10-CM | POA: Diagnosis not present

## 2019-06-07 DIAGNOSIS — E11319 Type 2 diabetes mellitus with unspecified diabetic retinopathy without macular edema: Secondary | ICD-10-CM | POA: Diagnosis not present

## 2019-06-07 DIAGNOSIS — I110 Hypertensive heart disease with heart failure: Secondary | ICD-10-CM | POA: Diagnosis not present

## 2019-06-07 DIAGNOSIS — I5032 Chronic diastolic (congestive) heart failure: Secondary | ICD-10-CM | POA: Diagnosis not present

## 2019-06-07 DIAGNOSIS — I2510 Atherosclerotic heart disease of native coronary artery without angina pectoris: Secondary | ICD-10-CM | POA: Diagnosis not present

## 2019-06-07 DIAGNOSIS — M1990 Unspecified osteoarthritis, unspecified site: Secondary | ICD-10-CM | POA: Diagnosis not present

## 2019-06-09 ENCOUNTER — Telehealth (INDEPENDENT_AMBULATORY_CARE_PROVIDER_SITE_OTHER): Payer: Medicare HMO | Admitting: Family Medicine

## 2019-06-09 ENCOUNTER — Encounter: Payer: Self-pay | Admitting: Student in an Organized Health Care Education/Training Program

## 2019-06-09 DIAGNOSIS — Z91199 Patient's noncompliance with other medical treatment and regimen due to unspecified reason: Secondary | ICD-10-CM

## 2019-06-09 DIAGNOSIS — Z5329 Procedure and treatment not carried out because of patient's decision for other reasons: Secondary | ICD-10-CM

## 2019-06-09 NOTE — Progress Notes (Signed)
Name: [name]   MRN: [id number]    DOB: 10-29-1965   Date:06/09/2019       Progress Note  Subjective:  No show for appt after multiple attempts to contact pt from office Chief Complaint  No chief complaint on file.   I connected with  [name]  on 06/09/19 at  3:20 PM EDT by a video enabled telemedicine application and verified that I am speaking with the correct person using two identifiers.  I discussed the limitations of evaluation and management by telemedicine and the availability of in person appointments. The patient expressed understanding and agreed to proceed. Staff also discussed with the patient that there may be a patient responsible charge related to this service. Patient Location:  Provider Location:  Additional Individuals present:   HPI   Pt presents for f/up on BP after losartan was increased from 50 to 100 two weeks ago.    Patient Active Problem List   Diagnosis Date Noted  . Hypoglycemia 05/17/2019  . Hypoglycemia associated with type 2 diabetes mellitus ([place]) 05/16/2019  . Chest pain 04/27/2019  . Type 2 diabetes mellitus without complication, with long-term current use of insulin ([place]) 04/27/2019  . COPD (chronic obstructive pulmonary disease) ([place]) 04/27/2019  . CAD (coronary artery disease) 04/27/2019  . H/O: stroke with residual effects   . Myalgia 04/19/2019  . 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 Non-intractable vomiting 03/01/2019  . History of CVA (cerebrovascular accident) 07/08/2018  . History of seizure 07/08/2018  . Diabetic polyneuropathy associated with type 2 diabetes mellitus ([place]) 03/02/2018  . Obesity (BMI 30-39.9) 03/02/2018  . Vitamin D deficiency 01/24/2018  . Vulvovaginal candidiasis 01/24/2018  . Hypokalemia 06/08/2017  . Overactive bladder 05/18/2017  . Asthma 06/22/2016  . Dilated aortic root ([place])   . Sacroiliac dysfunction 10/09/2014  . Chronic low back pain 09/03/2014  . CVA, old, hemiparesis  ([place]) 09/03/2014  . OSA (obstructive sleep apnea) 05/15/2014  . Restrictive airway disease 05/15/2014  . Colitis 02/22/2014  . Type 2 diabetes, uncontrolled, with renal manifestation ([place]) 01/21/2014  . Chronic pain syndrome 01/21/2014  . Headache disorder 09/07/2013  . Diabetic neuropathy ([place]) 05/23/2013  . Mixed urge and stress incontinence 08/25/2011  . Ureteral stricture, right 08/25/2011  . Pyelonephritis 08/22/2011  . Nephrolithiasis 08/22/2011  . Incisional hernia 05/10/2011  . Morbid obesity ([place]) 05/10/2011  . Tachycardia 05/08/2011  . BLINDNESS, [place], [place] DEFINITION 09/14/2006  . Hyperlipemia 09/13/2006  . Essential hypertension 09/13/2006  . MYOCARDIAL INFARCTION, HX OF 09/13/2006  . GERD 09/13/2006  . Seizure disorder ([place]) 09/13/2006    Social History   Tobacco Use  . Smoking status: Former Smoker    Packs/day: 1.00    Years: 15.00    Pack years: 15.00    Types: Cigarettes    Quit date: 01/20/1987    Years since quitting: 32.4  . Smokeless tobacco: Never Used  . Tobacco comment: quit more than 20 years  Substance Use Topics  . Alcohol use: Never    Alcohol/week: 0.0 standard drinks     Current Outpatient Medications:  .  albuterol (ACCUNEB) 1.25 MG/3ML nebulizer solution, Inhale into the lungs. Use 3 mls by nebulization every 6 hours as needed for wheezing, Disp: , Rfl:  .  albuterol (PROAIR HFA) 108 (90 Base) MCG/ACT inhaler, INHALE 2 PUFFS BY MOUTH EVERY 6 HOURS AS NEEDED FOR WHEEZING OR SHORTNESS OF BREATH, Disp: 3 Inhaler, Rfl: 3 .  budesonide (PULMICORT) 0.5 MG/2ML nebulizer solution, Inhale  0.5 mg into the lungs daily. Take 2 mls by nebulizations once daily , Disp: , Rfl:  .  Calcium Carbonate-Vitamin D (CALCIUM-VITAMIN D3 PO), D3 500- calcium 630 mg Take 1 capsule by mouth once daily, Disp: , Rfl:  .  carvedilol (COREG) 12.5 MG tablet, Take 1 tablet (12.5 mg total) by mouth 2 (two) times daily after a meal., Disp: 60 tablet, Rfl: 2 .  clopidogrel  (PLAVIX) 75 MG tablet, Take 1 tablet (75 mg total) by mouth daily., Disp: 90 tablet, Rfl: 0 .  cycloSPORINE (RESTASIS) 0.05 % ophthalmic emulsion, Place 2 drops into both eyes 2 (two) times daily., Disp: , Rfl:  .  Dulaglutide (TRULICITY) 1.5 UV/2.5DG SOPN, Inject 1.5 mg into the skin once a week., Disp: , Rfl:  .  DULoxetine (CYMBALTA) 20 MG capsule, Take 2 capsules q am and 1 capsule q pm, Disp: 90 capsule, Rfl: 2 .  DUPIXENT 300 MG/2ML SOPN, Inject 300 mg into the skin every 14 (fourteen) days. , Disp: , Rfl:  .  EPINEPHrine 0.3 mg/0.3 mL IJ SOAJ injection, Inject 0.3 mg into the muscle as needed for anaphylaxis. , Disp: , Rfl:  .  ezetimibe (ZETIA) 10 MG tablet, Take 1 tablet (10 mg total) by mouth daily., Disp: 90 tablet, Rfl: 3 .  fluticasone (FLONASE) 50 MCG/ACT nasal spray, Place 2 sprays into both nostrils daily., Disp: 16 g, Rfl: 3 .  Fluticasone-Salmeterol (WIXELA INHUB) 100-50 MCG/DOSE AEPB, Inhale 1 puff into the lungs 2 (two) times daily., Disp: , Rfl:  .  gabapentin (NEURONTIN) 400 MG capsule, 400 mg qAM, 400 mg qPM, 800 mg qhs, Disp: 120 capsule, Rfl: 2 .  insulin glargine, 2 Unit Dial, (TOUJEO MAX) 300 UNIT/ML Solostar Pen, Hold your long-acting until followup with your outpatient endocrinology provider. (Patient taking differently: Inject 80 Units into the skin daily. ), Disp: 15 mL, Rfl: 1 .  levETIRAcetam (KEPPRA) 500 MG tablet, Take 500 mg by mouth 2 (two) times daily. 1 TAB IN THE MORNING AND .5 TABS AT NIGHT, Disp: , Rfl:  .  losartan (COZAAR) 100 MG tablet, Take 1 tablet (100 mg total) by mouth daily., Disp: 90 tablet, Rfl: 3 .  mirabegron ER (MYRBETRIQ) 50 MG TB24 tablet, Take 1 tablet (50 mg total) by mouth daily., Disp: 90 tablet, Rfl: 1 .  nitroGLYCERIN (NITROSTAT) 0.4 MG SL tablet, Place 1 tablet (0.4 mg total) under the tongue every 5 (five) minutes as needed for chest pain., Disp: 90 tablet, Rfl: 12 .  ondansetron (ZOFRAN ODT) 4 MG disintegrating tablet, Take 1 tablet (4  mg total) by mouth every 8 (eight) hours as needed for nausea or vomiting., Disp: 20 tablet, Rfl: 1 .  pantoprazole (PROTONIX) 40 MG tablet, Take 1 tablet (40 mg total) by mouth daily., Disp: 90 tablet, Rfl: 0 .  polyethylene glycol (MIRALAX / GLYCOLAX) 17 g packet, Take 17 g by mouth daily as needed for mild constipation., Disp: 100 each, Rfl: 3 .  potassium chloride SA (KLOR-CON M20) 20 MEQ tablet, You have not needed potassium while in the hospital.  Hold until outpatient followup., Disp: 60 tablet, Rfl: 5 .  simvastatin (ZOCOR) 40 MG tablet, Take 40 mg by mouth daily., Disp: , Rfl:  .  spironolactone (ALDACTONE) 25 MG tablet, Take 1 tablet by mouth daily., Disp: , Rfl:  .  terconazole (TERAZOL 7) 0.4 % vaginal cream, Place 1 applicator vaginally at bedtime as needed (irritation)., Disp: , Rfl:   Allergies  Allergen Reactions  .  Codeine Swelling and Other (See Comments)    Swelling and burning of mouth (inside)  . Lactose Intolerance (Gi) Nausea And Vomiting      Review of Systems    Objective:   Virtual encounter, vitals limited, only able to obtain the following There were no vitals filed for this visit. There is no height or weight on file to calculate BMI. Nursing Note and Vital Signs reviewed.  Physical Exam  PE limited by telephone encounter  No results found for this or any previous visit (from the past 72 hour(s)).  Assessment and Plan:   No diagnosis found.   -Red flags and when to present for emergency care or RTC including fever >101.55F, chest pain, shortness of breath, new/worsening/un-resolving symptoms, reviewed with patient at time of visit. Follow up and care instructions discussed and provided in AVS. - I discussed the assessment and treatment plan with the patient. The patient was provided an opportunity to ask questions and all were answered. The patient agreed with the plan and demonstrated an understanding of the instructions.  I provided  minutes of  non-face-to-face time during this encounter.  Delsa Grana, [name] 06/09/19 3:30 PM

## 2019-06-14 DIAGNOSIS — E1140 Type 2 diabetes mellitus with diabetic neuropathy, unspecified: Secondary | ICD-10-CM | POA: Diagnosis not present

## 2019-06-14 DIAGNOSIS — I110 Hypertensive heart disease with heart failure: Secondary | ICD-10-CM | POA: Diagnosis not present

## 2019-06-14 DIAGNOSIS — M1990 Unspecified osteoarthritis, unspecified site: Secondary | ICD-10-CM | POA: Diagnosis not present

## 2019-06-14 DIAGNOSIS — J449 Chronic obstructive pulmonary disease, unspecified: Secondary | ICD-10-CM | POA: Diagnosis not present

## 2019-06-14 DIAGNOSIS — E11319 Type 2 diabetes mellitus with unspecified diabetic retinopathy without macular edema: Secondary | ICD-10-CM | POA: Diagnosis not present

## 2019-06-14 DIAGNOSIS — I5032 Chronic diastolic (congestive) heart failure: Secondary | ICD-10-CM | POA: Diagnosis not present

## 2019-06-14 DIAGNOSIS — I2510 Atherosclerotic heart disease of native coronary artery without angina pectoris: Secondary | ICD-10-CM | POA: Diagnosis not present

## 2019-06-14 DIAGNOSIS — H548 Legal blindness, as defined in USA: Secondary | ICD-10-CM | POA: Diagnosis not present

## 2019-06-14 DIAGNOSIS — I69354 Hemiplegia and hemiparesis following cerebral infarction affecting left non-dominant side: Secondary | ICD-10-CM | POA: Diagnosis not present

## 2019-06-22 DIAGNOSIS — E1140 Type 2 diabetes mellitus with diabetic neuropathy, unspecified: Secondary | ICD-10-CM | POA: Diagnosis not present

## 2019-06-22 DIAGNOSIS — H548 Legal blindness, as defined in USA: Secondary | ICD-10-CM | POA: Diagnosis not present

## 2019-06-22 DIAGNOSIS — I110 Hypertensive heart disease with heart failure: Secondary | ICD-10-CM | POA: Diagnosis not present

## 2019-06-22 DIAGNOSIS — E11319 Type 2 diabetes mellitus with unspecified diabetic retinopathy without macular edema: Secondary | ICD-10-CM | POA: Diagnosis not present

## 2019-06-22 DIAGNOSIS — J449 Chronic obstructive pulmonary disease, unspecified: Secondary | ICD-10-CM | POA: Diagnosis not present

## 2019-06-22 DIAGNOSIS — I5032 Chronic diastolic (congestive) heart failure: Secondary | ICD-10-CM | POA: Diagnosis not present

## 2019-06-22 DIAGNOSIS — M1990 Unspecified osteoarthritis, unspecified site: Secondary | ICD-10-CM | POA: Diagnosis not present

## 2019-06-22 DIAGNOSIS — I2510 Atherosclerotic heart disease of native coronary artery without angina pectoris: Secondary | ICD-10-CM | POA: Diagnosis not present

## 2019-06-22 DIAGNOSIS — I69354 Hemiplegia and hemiparesis following cerebral infarction affecting left non-dominant side: Secondary | ICD-10-CM | POA: Diagnosis not present

## 2019-06-23 ENCOUNTER — Encounter: Payer: Self-pay | Admitting: Internal Medicine

## 2019-06-26 ENCOUNTER — Other Ambulatory Visit: Payer: Self-pay

## 2019-06-27 DIAGNOSIS — J4550 Severe persistent asthma, uncomplicated: Secondary | ICD-10-CM | POA: Diagnosis not present

## 2019-07-05 DIAGNOSIS — I5032 Chronic diastolic (congestive) heart failure: Secondary | ICD-10-CM | POA: Diagnosis not present

## 2019-07-05 DIAGNOSIS — M1990 Unspecified osteoarthritis, unspecified site: Secondary | ICD-10-CM | POA: Diagnosis not present

## 2019-07-05 DIAGNOSIS — E1140 Type 2 diabetes mellitus with diabetic neuropathy, unspecified: Secondary | ICD-10-CM | POA: Diagnosis not present

## 2019-07-05 DIAGNOSIS — I110 Hypertensive heart disease with heart failure: Secondary | ICD-10-CM | POA: Diagnosis not present

## 2019-07-05 DIAGNOSIS — E11319 Type 2 diabetes mellitus with unspecified diabetic retinopathy without macular edema: Secondary | ICD-10-CM | POA: Diagnosis not present

## 2019-07-05 DIAGNOSIS — I69354 Hemiplegia and hemiparesis following cerebral infarction affecting left non-dominant side: Secondary | ICD-10-CM | POA: Diagnosis not present

## 2019-07-05 DIAGNOSIS — I2510 Atherosclerotic heart disease of native coronary artery without angina pectoris: Secondary | ICD-10-CM | POA: Diagnosis not present

## 2019-07-05 DIAGNOSIS — J449 Chronic obstructive pulmonary disease, unspecified: Secondary | ICD-10-CM | POA: Diagnosis not present

## 2019-07-05 DIAGNOSIS — H548 Legal blindness, as defined in USA: Secondary | ICD-10-CM | POA: Diagnosis not present

## 2019-07-06 DIAGNOSIS — R0600 Dyspnea, unspecified: Secondary | ICD-10-CM | POA: Diagnosis not present

## 2019-07-06 DIAGNOSIS — Z01818 Encounter for other preprocedural examination: Secondary | ICD-10-CM | POA: Diagnosis not present

## 2019-07-13 ENCOUNTER — Other Ambulatory Visit: Payer: Self-pay | Admitting: Student in an Organized Health Care Education/Training Program

## 2019-07-13 DIAGNOSIS — M791 Myalgia, unspecified site: Secondary | ICD-10-CM

## 2019-07-13 DIAGNOSIS — E1142 Type 2 diabetes mellitus with diabetic polyneuropathy: Secondary | ICD-10-CM

## 2019-07-18 ENCOUNTER — Telehealth: Payer: Self-pay | Admitting: Family Medicine

## 2019-07-18 NOTE — Telephone Encounter (Signed)
Discussed with patient new pharmacy and all med has already been sent there

## 2019-07-18 NOTE — Telephone Encounter (Signed)
Pt has called saying that she just came from her pharmacy ([facility]) and they have told her she needs to contact her dr. Penelope Galas there is a problem with her medication. Pt states that the pharmacy has told her that the dr needs to rewrite all of her scripts, any medication she is on,w She wants them all resent to [facility]. After futher discussion she said to resend them all to [facility]. Upon looking at the chart I informed pt that a nurse would reach out to her for further info to make sure we were doing exactly what was requested, [phone]

## 2019-07-19 ENCOUNTER — Encounter: Payer: Medicare HMO | Admitting: Student in an Organized Health Care Education/Training Program

## 2019-08-04 DIAGNOSIS — J449 Chronic obstructive pulmonary disease, unspecified: Secondary | ICD-10-CM | POA: Diagnosis not present

## 2019-08-04 DIAGNOSIS — J4550 Severe persistent asthma, uncomplicated: Secondary | ICD-10-CM | POA: Diagnosis not present

## 2019-09-05 ENCOUNTER — Ambulatory Visit: Payer: Medicare HMO | Admitting: Internal Medicine

## 2019-09-13 NOTE — Progress Notes (Signed)
Patient ID: [name], female    DOB: [birth year], 54 y.o.   MRN: [id number]  PCP: Towanda Malkin, MD  Chief Complaint  Patient presents with  . Follow-up    Endocrine has recently discharged the patient from their care     Subjective:   [name] is a 54 y.o. female, presents to clinic with CC of the following:  Chief Complaint  Patient presents with  . Follow-up    Endocrine has recently discharged the patient from their care     HPI:  Patient is a 54 year old female patient of Delsa Grana Last follow-up with her was Jun 01, 2019 after a couple emergency room visits for hypertension and diabetes. That note was reviewed. She follows up today and notes that endocrine has recently discharged her from their care. All in all feeling good She just had an eye care appointment earlier today, and notes they want to send her to a glaucoma specialist.  COPD, asthma, OSA She last followed up with pulmonary on 07/06/2019, with pulmonary function test done at that time.  Results were as follows: [name] is a 54 y.o. female reports that she quit smoking about 31 years ago. Her smoking use included cigarettes. She quit after 9.00 years of use. She has never used smokeless tobacco.  SPIROMETRY: FVC was 1.97 liters, 74% of predicted/Post 1.80, 67%, -9% Change FEV1 was 0.99, 45% of predicted/Post 1.24, 57%, 26% Change FEV1 ratio was 50/Post 69 FEF 25-75% liters per second was 25% of predicted/Post 40%, 61% Change *Patient took Personal Albuterol Inhaler for Post Spirometry  LUNG VOLUMES: TLC was 71% of predicted RV was 90% of predicted  DIFFUSION CAPACITY: DLCO was 56% of predicted DLCO/VA was 116% of predicted  FLOW VOLUME LOOP: Mildly restricted FVL shape  Impression Moderate obstructive ventilatory defect with decreased dlco at 56% consistent with Asthma and COPD overalp  At that visit, the following was  noted:  07/06/19- patient reports feeling much imprved. , we have received Dupixent and administered 5 doses only but patient has noted significant improvement already. She has been using nebulizer and is on pulmicort/albuterol combo with signficant improvement . This was a 26% improvement in FEV1 on PFT.    She has continued Dupixent injections, having received the last two on  08/04/2019 and 09/08/2019. Her next follow-up with pulmonary is in September.  She notes she changed insurances, and is hoping she can continue the [place] with the change of insurances.   Pt was admitted for T2DM with hypoglycemia Followed by endocrinology to date Assessment/plan from last visit in May was as follows:  Diabetes -  - We discussed diabetes, its complications, and goals of care. Patient was advised of the importance of diet and behavioral changes to improve glycemic control.  - Diabetes is moderately controlled with recent severe hypoglycemia of unknown etiology I will adjust medication regimen as follows to prevent hypoglycemia:  - Restart Trulicity 1.5 mg weekly on Sunday - Restart Toujeo 80 units each evening - Continue Novolog sliding scale: Add 2 units of NovoLog if your sugar is 150-200 Add 4 units of NovoLog if your sugar is 201-250 Add 6 units of NovoLog if your sugar is 251-300 Add 8 units of NovoLog if your sugar is 301-350 Add 10 units of NovoLog if your sugar is 351-400 Add 12 units of NovoLog if your sugar is over 401  Patient was advised of the importance of regular  monitoring of blood sugars.  - Instructed to monitor blood sugars 4 times daily, fasting in the morning, before lunch, before dinner and again before bed. - Reminded to bring blood sugar log and/or meter to every visit  Obesity -  Patient was advised of the importance of a low carbohydrate diet. Patient was advised of the importance of achieving and maintaining a healthy body weight. She is encouraged to lose weight and is  advised to follow a low carbohydrate diet with intake of lean proteins, supplemental vegetables and low-fat food choices. The importance of portion control is emphasized. Patient is encouraged to participate in regular exercise activity as tolerated, striving for sustained physical activity 4-5 times weekly.  Hypertension -  Moderate blood pressure control continue carvedilol and losartan Hyperlipidemia - LDL at goal. No myalgias. Continue simvastatin, zetia Neuropathy -  Patient reports longstanding left-sided weakness for greater than 30 years with bilateral foot pain and numbness. She is taking gabapentin 300 mg 3 times daily with some improvement of her pain. Continue gabapentin. Will monitor  Return to clinic in 6 weeks for follow up (has not followed up after that may visit)  She missed her last appointment in June, noted it was canceled two days prior. She got a letter in the mail noting she was being discharged from the practice.  Medication regimen-Trulicity, Toujeo max and novolog insulin  BS's at home when gets up  - about 190 on average No low readings, no below 100, none in 300's. Highest - 212 Had eye exam today, no retinopathy concerns.  On 05/24/2019, A1c was 7.8 Lab Results  Component Value Date   HGBA1C 8.9 (H) 04/28/2019   HGBA1C 12.6 (H) 01/24/2018   HGBA1C 10.6 (A) 07/05/2017   Lab Results  Component Value Date   MICROALBUR 3.2 10/12/2018   LDLCALC 92 04/28/2019   CREATININE 0.83 05/30/2019   Wt Readings from Last 3 Encounters:  09/14/19 240 lb 12.8 oz (109.2 kg)  06/01/19 240 lb 8 oz (109.1 kg)  05/30/19 223 lb (101.2 kg)    HTN  Medication regimen-losartan 100 mg daily (increased from 50 mg at ER visit 05/30/2019), Coreg, spironolactone Taking medication regularly Does check blood pressures at home, running good - 140/50 last one checked BP Readings from Last 3 Encounters:  09/14/19 (!) 144/84  06/01/19 140/88  05/30/19 (!) 189/124   Denies chest  pain, palpitations, shortness of breath, increased lower extremity swelling, increased headaches, vision changes  History of CVA/neuropathy/seizure hx:  Continues on Plavix Last saw neurology 09/22/2018, with the following A/P from that visit:  HISTORY OF CVA - Stable.  - Patient with history of multiple strokes, last stroke in 2013. Denies recent stroke-like symptoms. Patient has been seen by cardiology since her last visit.  - Continue Plavix 75 mg daily for secondary stroke prevention.  - Continue working with endocrinology for diabetes management.  Patient states no new stroke like symptoms. Taking plavix 75 mg no side effects. Seen a cardiologist. Taking Keppra 500 mg no side effects HISTORY OF SEIZURES - Stable.  - Patient with long history of lifetime seizures. Denies recent seizure-like activity.  - Will repeat EEG within the next 3-6 months.  Patient states no new seizures. Last seizure was when she has done the stress test. Has had a EEG.  DIABETIC NEUROPATHY - New. - Patient with history of diabetic neuropathy. - Increase gabapentin to 400 mg three times a day.  - May consider EMG (electromyography) of lower extremity at next  appointment.  Patient states left side feels numbness and tingling from left shoulder to left foot. No falls. Taking gabapentin 400 mg 3 times day no side effects   GERD:  Continues on a PPI.  She does note the PPI is helpful.  HLD:   Medication regimen-simvastatin, Zetia  LDL goal is less than 70, slightly above goal   Lab Results  Component Value Date   CHOL 155 04/28/2019   HDL 43 04/28/2019   LDLCALC 92 04/28/2019   TRIG 102 04/28/2019   CHOLHDL 3.6 04/28/2019   Denies increased myalgias  Patient Active Problem List   Diagnosis Date Noted  . Hypoglycemia 05/17/2019  . Hypoglycemia associated with type 2 diabetes mellitus ([place]) 05/16/2019  . Chest pain 04/27/2019  . Type 2 diabetes mellitus without complication, with long-term  current use of insulin ([place]) 04/27/2019  . COPD (chronic obstructive pulmonary disease) ([place]) 04/27/2019  . CAD (coronary artery disease) 04/27/2019  . H/O: stroke with residual effects   . Myalgia 04/19/2019  . 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 Non-intractable vomiting 03/01/2019  . History of CVA (cerebrovascular accident) 07/08/2018  . History of seizure 07/08/2018  . Diabetic polyneuropathy associated with type 2 diabetes mellitus ([place]) 03/02/2018  . Obesity (BMI 30-39.9) 03/02/2018  . Vitamin D deficiency 01/24/2018  . Vulvovaginal candidiasis 01/24/2018  . Hypokalemia 06/08/2017  . Overactive bladder 05/18/2017  . Asthma 06/22/2016  . Dilated aortic root ([place])   . Sacroiliac dysfunction 10/09/2014  . Chronic low back pain 09/03/2014  . CVA, old, hemiparesis ([place]) 09/03/2014  . OSA (obstructive sleep apnea) 05/15/2014  . Restrictive airway disease 05/15/2014  . Colitis 02/22/2014  . Type 2 diabetes, uncontrolled, with renal manifestation ([place]) 01/21/2014  . Chronic pain syndrome 01/21/2014  . Headache disorder 09/07/2013  . Diabetic neuropathy ([place]) 05/23/2013  . Mixed urge and stress incontinence 08/25/2011  . Ureteral stricture, right 08/25/2011  . Pyelonephritis 08/22/2011  . Nephrolithiasis 08/22/2011  . Incisional hernia 05/10/2011  . Morbid obesity ([place]) 05/10/2011  . Tachycardia 05/08/2011  . BLINDNESS, [place] DEFINITION 09/14/2006  . Hyperlipemia 09/13/2006  . Essential hypertension 09/13/2006  . MYOCARDIAL INFARCTION, HX OF 09/13/2006  . GERD 09/13/2006  . Seizure disorder ([place]) 09/13/2006      Current Outpatient Medications:  .  albuterol (ACCUNEB) 1.25 MG/3ML nebulizer solution, Inhale into the lungs. Use 3 mls by nebulization every 6 hours as needed for wheezing, Disp: , Rfl:  .  albuterol (PROAIR HFA) 108 (90 Base) MCG/ACT inhaler, INHALE 2 PUFFS BY MOUTH EVERY 6 HOURS AS NEEDED FOR WHEEZING OR SHORTNESS OF BREATH, Disp: 3 Inhaler, Rfl: 3 .   budesonide (PULMICORT) 0.5 MG/2ML nebulizer solution, Inhale 0.5 mg into the lungs daily. Take 2 mls by nebulizations once daily , Disp: , Rfl:  .  Calcium Carbonate-Vitamin D (CALCIUM-VITAMIN D3 PO), D3 500- calcium 630 mg Take 1 capsule by mouth once daily, Disp: , Rfl:  .  carvedilol (COREG) 12.5 MG tablet, Take 1 tablet (12.5 mg total) by mouth 2 (two) times daily after a meal., Disp: 60 tablet, Rfl: 2 .  clopidogrel (PLAVIX) 75 MG tablet, Take 1 tablet (75 mg total) by mouth daily., Disp: 90 tablet, Rfl: 0 .  cycloSPORINE (RESTASIS) 0.05 % ophthalmic emulsion, Place 2 drops into both eyes 2 (two) times daily., Disp: , Rfl:  .  Dulaglutide (TRULICITY) 1.5 TT/0.1XB SOPN, Inject 1.5 mg into the skin once a week., Disp: , Rfl:  .  DULoxetine (CYMBALTA) 20 MG capsule, Take  2 capsules q am and 1 capsule q pm, Disp: 90 capsule, Rfl: 2 .  DUPIXENT 300 MG/2ML SOPN, Inject 300 mg into the skin every 14 (fourteen) days. , Disp: , Rfl:  .  ezetimibe (ZETIA) 10 MG tablet, Take 1 tablet (10 mg total) by mouth daily., Disp: 90 tablet, Rfl: 3 .  fluticasone (FLONASE) 50 MCG/ACT nasal spray, Place 2 sprays into both nostrils daily., Disp: 16 g, Rfl: 3 .  Fluticasone-Salmeterol (WIXELA INHUB) 100-50 MCG/DOSE AEPB, Inhale 1 puff into the lungs 2 (two) times daily., Disp: , Rfl:  .  gabapentin (NEURONTIN) 400 MG capsule, 400 mg qAM, 400 mg qPM, 800 mg qhs, Disp: 120 capsule, Rfl: 2 .  insulin glargine, 2 Unit Dial, (TOUJEO MAX) 300 UNIT/ML Solostar Pen, Hold your long-acting until followup with your outpatient endocrinology provider. (Patient taking differently: Inject 80 Units into the skin daily. ), Disp: 15 mL, Rfl: 1 .  levETIRAcetam (KEPPRA) 500 MG tablet, Take 500 mg by mouth 2 (two) times daily. 1 TAB IN THE MORNING AND .5 TABS AT NIGHT, Disp: , Rfl:  .  losartan (COZAAR) 100 MG tablet, Take 1 tablet (100 mg total) by mouth daily., Disp: 90 tablet, Rfl: 3 .  mirabegron ER (MYRBETRIQ) 50 MG TB24 tablet, Take 1  tablet (50 mg total) by mouth daily., Disp: 90 tablet, Rfl: 1 .  ondansetron (ZOFRAN ODT) 4 MG disintegrating tablet, Take 1 tablet (4 mg total) by mouth every 8 (eight) hours as needed for nausea or vomiting., Disp: 20 tablet, Rfl: 1 .  pantoprazole (PROTONIX) 40 MG tablet, Take 1 tablet (40 mg total) by mouth daily., Disp: 90 tablet, Rfl: 0 .  potassium chloride SA (KLOR-CON M20) 20 MEQ tablet, You have not needed potassium while in the hospital.  Hold until outpatient followup., Disp: 60 tablet, Rfl: 5 .  simvastatin (ZOCOR) 40 MG tablet, Take 40 mg by mouth daily., Disp: , Rfl:  .  spironolactone (ALDACTONE) 25 MG tablet, Take 1 tablet by mouth daily., Disp: , Rfl:  .  terconazole (TERAZOL 7) 0.4 % vaginal cream, Place 1 applicator vaginally at bedtime as needed (irritation)., Disp: , Rfl:  .  EPINEPHrine 0.3 mg/0.3 mL IJ SOAJ injection, Inject 0.3 mg into the muscle as needed for anaphylaxis.  (Patient not taking: Reported on 09/14/2019), Disp: , Rfl:  .  nitroGLYCERIN (NITROSTAT) 0.4 MG SL tablet, Place 1 tablet (0.4 mg total) under the tongue every 5 (five) minutes as needed for chest pain. (Patient not taking: Reported on 09/14/2019), Disp: 90 tablet, Rfl: 12 .  polyethylene glycol (MIRALAX / GLYCOLAX) 17 g packet, Take 17 g by mouth daily as needed for mild constipation., Disp: 100 each, Rfl: 3   Allergies  Allergen Reactions  . Codeine Swelling and Other (See Comments)    Swelling and burning of mouth (inside)  . Lactose Intolerance (Gi) Nausea And Vomiting     Past Surgical History:  Procedure Laterality Date  . BRAIN HEMATOMA EVACUATION     five procedures total, first procedure when 34 months old, last at 54 years of age.  . CYSTOSCOPY W/ RETROGRADES  09/24/2011   Procedure: CYSTOSCOPY WITH RETROGRADE PYELOGRAM;  Surgeon: Malka So, MD;  Location: [facility];  Service: Urology;  Laterality: Right;  . CYSTOSCOPY WITH URETEROSCOPY  09/24/2011   Procedure: CYSTOSCOPY WITH URETEROSCOPY;   Surgeon: Malka So, MD;  Location: [facility];  Service: Urology;  Laterality: Right;  Balloon dilation right ureter   . CYSTOSCOPY/RETROGRADE/URETEROSCOPY  08/24/2011   Procedure: CYSTOSCOPY/RETROGRADE/URETEROSCOPY;  Surgeon: Molli Hazard, MD;  Location: [facility];  Service: Urology;  Laterality: Right;  Cysto, Right retrograde Pyelogram, right stent placement.   Fatima Blank HERNIA REPAIR  05/05/2011   Procedure: LAPAROSCOPIC INCISIONAL HERNIA;  Surgeon: Rolm Bookbinder, MD;  Location: [facility];  Service: General;  Laterality: N/A;  . kidney stone removal    . MASS EXCISION  05/05/2011   Procedure: EXCISION MASS;  Surgeon: Rolm Bookbinder, MD;  Location: [facility];  Service: General;  Laterality: Right;  . PCNL    . RETINAL DETACHMENT SURGERY Left 1990  . RIGHT/LEFT HEART CATH AND CORONARY ANGIOGRAPHY N/A 04/28/2019   Procedure: RIGHT/LEFT HEART CATH AND CORONARY ANGIOGRAPHY;  Surgeon: Yolonda Kida, MD;  Location: [facility];  Service: Cardiovascular;  Laterality: N/A;  . SHUNT REMOVAL     shunt inserted at age 89 removed at age 62   . VAGINAL HYSTERECTOMY  1996     Family History  Problem Relation Age of Onset  . Uterine cancer Mother   . Hypertension Mother   . Cancer Mother   . Brain cancer Maternal Grandmother   . Hypertension Maternal Grandmother   . Birth defects Daughter   . Hypertension Daughter   . Cirrhosis Maternal Grandfather   . ADD / ADHD Maternal Grandfather   . Birth defects Maternal Grandfather   . Diabetes Maternal Grandfather   . Anesthesia problems Neg Hx   . Colon cancer Neg Hx   . Esophageal cancer Neg Hx   . Pancreatic cancer Neg Hx      Social History   Tobacco Use  . Smoking status: Former Smoker    Packs/day: 1.00    Years: 15.00    Pack years: 15.00    Types: Cigarettes    Quit date: 01/20/1987    Years since quitting: 32.6  . Smokeless tobacco: Never Used  . Tobacco comment: quit more than 20 years  Substance Use Topics  .  Alcohol use: Never    Alcohol/week: 0.0 standard drinks    With staff assistance, above reviewed with the patient today.  ROS: As per HPI, otherwise no specific complaints on a limited and focused system review   No results found for this or any previous visit (from the past 72 hour(s)).   PHQ2/9: Depression screen [facility] 2/9 09/14/2019 06/01/2019 05/05/2019 04/18/2019 03/01/2019  Decreased Interest 0 0 0 0 0  Down, Depressed, Hopeless 0 0 0 0 0  PHQ - 2 Score 0 0 0 0 0  Altered sleeping - 0 1 1 1   Tired, decreased energy - 0 0 1 1  Change in appetite - 0 0 0 0  Feeling bad or failure about yourself  - 0 0 0 0  Trouble concentrating - 0 0 0 0  Moving slowly or fidgety/restless - 0 0 0 0  Suicidal thoughts - 0 0 0 0  PHQ-9 Score - 0 1 2 2   Difficult doing work/chores - Not difficult at all Not difficult at all Somewhat difficult Not difficult at all  Some recent data might be hidden   PHQ-2/9 Result is neg  Fall Risk: Fall Risk  09/14/2019 06/01/2019 05/05/2019 04/18/2019 03/01/2019  Falls in the past year? 1 1 1 1  0  Number falls in past yr: 1 1 0 1 1  Comment 3 - - - -  Injury with Fall? 0 0 0 0 0  Risk for fall due to : History of fall(s)  Impaired balance/gait;History of fall(s);Impaired mobility - - -  Risk for fall due to: Comment - - - - -  Follow up Falls evaluation completed - - - -      Objective:   Vitals:   09/14/19 1303  BP: (!) 144/84  Pulse: 96  Resp: 16  Temp: 98.4 F (36.9 C)  TempSrc: Oral  SpO2: 99%  Weight: 240 lb 12.8 oz (109.2 kg)  Height: 5' 5"  (1.651 m)    Body mass index is 40.07 kg/m. Rechecked by myself was 150/82 with a large adult cuff on the left Physical Exam   NAD, masked HEENT - [place]/AT, sclera anicteric, pupils dilated bilaterally after her recent eye exam, EOMI, conj - non-inj'ed, pharynx clear Neck - supple, no adenopathy, no TM, carotids 2+ and = without bruits bilat Car - RRR without m/g/r Pulm- RR and effort normal at rest, CTA  without wheeze or rales Abd - soft, obese, NT diffusely, no epigastric tenderness, ND,  Back - no CVA tenderness Ext - no LE edema, no active joints Neuro/psychiatric - affect was not flat, appropriate with conversation  Alert and oriented  Sensation intact to light touch in all extremities, strength adequate in testing extremities with a subtle weakness in the left upper extremity which is not new, and has improved some over time.  Speech normal   Results for orders placed or performed during the hospital encounter of 17/79/39  Basic metabolic panel  Result Value Ref Range   Sodium 142 135 - 145 mmol/L   Potassium 3.5 3.5 - 5.1 mmol/L   Chloride 107 98 - 111 mmol/L   CO2 27 22 - 32 mmol/L   Glucose, Bld 128 (H) 70 - 99 mg/dL   BUN 18 6 - 20 mg/dL   Creatinine, Ser 0.83 0.44 - 1.00 mg/dL   Calcium 9.2 8.9 - 10.3 mg/dL   GFR calc non Af Amer >60 >60 mL/min   GFR calc Af Amer >60 >60 mL/min   Anion gap 8 5 - 15  CBC  Result Value Ref Range   WBC 6.2 4.0 - 10.5 K/uL   RBC 4.16 3.87 - 5.11 MIL/uL   Hemoglobin 12.4 12.0 - 15.0 g/dL   HCT 38.1 36 - 46 %   MCV 91.6 80.0 - 100.0 fL   MCH 29.8 26.0 - 34.0 pg   MCHC 32.5 30.0 - 36.0 g/dL   RDW 13.7 11.5 - 15.5 %   Platelets 283 150 - 400 K/uL   nRBC 0.0 0.0 - 0.2 %  Troponin I (High Sensitivity)  Result Value Ref Range   Troponin I (High Sensitivity) 5 <18 ng/L   Last labs reviewed A1C today 9.4 (increased again)    Assessment & Plan:    1. Encounter for screening mammogram for malignant neoplasm of breast Patient due for a mammogram screening ordered. - MM 3D SCREEN BREAST BILATERAL; Future  2. Essential hypertension Blood pressure remains borderline high in the systolic number, with her noting her blood pressures have been much better on recent checks at home. Hesitate increasing her blood pressure medicine further presently, and will continue to monitor. Last BMP in May was good.  (Except for a slightly high blood  sugar)  3. Diabetic polyneuropathy associated with type 2 diabetes mellitus (HCC) Her blood sugars are not well controlled, and do feel endocrinology input is needed.  Anticipates she was discharged from the prior practice with compliance concerns.  We will put a referral through the endocrine again  to see if we can get her into another endocrinologist. She will continue her current regimen, as there are concerns with trying to increase insulin doses with potential compliance concerns, she did just have an ER visit with a low blood sugar noted. Feel best to continue a maintenance insulin dose and then have a adjustment with a mealtime dose when needed with a sliding scale, and also continue the Trulicity product which she takes once weekly.  She could not tolerate Metformin in the past due to GI side effects. Lifestyle modifications continue to be emphasized. 4. Mixed hyperlipidemia Her LDL is slightly below goal, although she is on the max dose of simvastatin, and also takes the Zetia product now. We will continue to monitor.  5. Morbid obesity ([place]) Weight has been relatively stable in the recent past.  6. Chronic obstructive pulmonary disease, unspecified COPD type ([place]) Seeing pulmonary, and notes that Dupixent product has been helpful for her.    7. CVA, old, hemiparesis ([place]) Has been stable, remains on Plavix 8. Seizure disorder ([place]) Has been followed by neurology, with incision of a follow-up EEG noted previously. She does not have a planned follow-up visit on the epic, and asked that she contact them to inquire about a follow-up visit.  9. Gastroesophageal reflux disease without esophagitis Has been stable on the PPI.   It is hoped we can get another endocrinologist to help with her management, and another referral was placed today to try to do so. Tentatively is scheduled to follow-up again here in 3 to 4 months time, follow-up sooner as needed.     Towanda Malkin, MD 09/14/19 1:27 PM

## 2019-09-14 ENCOUNTER — Encounter: Payer: Self-pay | Admitting: Internal Medicine

## 2019-09-14 ENCOUNTER — Ambulatory Visit (INDEPENDENT_AMBULATORY_CARE_PROVIDER_SITE_OTHER): Payer: Medicare Other | Admitting: Internal Medicine

## 2019-09-14 ENCOUNTER — Other Ambulatory Visit: Payer: Self-pay

## 2019-09-14 VITALS — BP 144/84 | HR 96 | Temp 98.4°F | Resp 16 | Ht 65.0 in | Wt 240.8 lb

## 2019-09-14 DIAGNOSIS — E782 Mixed hyperlipidemia: Secondary | ICD-10-CM | POA: Diagnosis not present

## 2019-09-14 DIAGNOSIS — I10 Essential (primary) hypertension: Secondary | ICD-10-CM

## 2019-09-14 DIAGNOSIS — Z1231 Encounter for screening mammogram for malignant neoplasm of breast: Secondary | ICD-10-CM

## 2019-09-14 DIAGNOSIS — K219 Gastro-esophageal reflux disease without esophagitis: Secondary | ICD-10-CM

## 2019-09-14 DIAGNOSIS — G40909 Epilepsy, unspecified, not intractable, without status epilepticus: Secondary | ICD-10-CM

## 2019-09-14 DIAGNOSIS — E1142 Type 2 diabetes mellitus with diabetic polyneuropathy: Secondary | ICD-10-CM

## 2019-09-14 DIAGNOSIS — I69359 Hemiplegia and hemiparesis following cerebral infarction affecting unspecified side: Secondary | ICD-10-CM

## 2019-09-14 DIAGNOSIS — J449 Chronic obstructive pulmonary disease, unspecified: Secondary | ICD-10-CM

## 2019-09-14 LAB — POCT GLYCOSYLATED HEMOGLOBIN (HGB A1C): Hemoglobin A1C: 9.4 % — AB (ref 4.0–5.6)

## 2019-09-14 NOTE — Patient Instructions (Signed)
Type 2 Diabetes Mellitus, Self Care, Adult When you have type 2 diabetes (type 2 diabetes mellitus), you must make sure your blood sugar (glucose) stays in a healthy range. You can do this with:  Nutrition.  Exercise.  Lifestyle changes.  Medicines or insulin, if needed.  Support from your doctors and others. How to stay aware of blood sugar   Check your blood sugar level every day, as often as told.  Have your A1c (hemoglobin A1c) level checked two or more times a year. Have it checked more often if your doctor tells you to. Your doctor will set personal treatment goals for you. Generally, you should have these blood sugar levels:  Before meals (preprandial): 80-130 mg/dL (4.4-7.2 mmol/L).  After meals (postprandial): below 180 mg/dL (10 mmol/L).  A1c level: less than 7%. How to manage high and low blood sugar Signs of high blood sugar High blood sugar is called hyperglycemia. Know the signs of high blood sugar. Signs may include:  Feeling: ? Thirsty. ? Hungry. ? Very tired.  Needing to pee (urinate) more than usual.  Blurry vision. Signs of low blood sugar Low blood sugar is called hypoglycemia. This is when blood sugar is at or below 70 mg/dL (3.9 mmol/L). Signs may include:  Feeling: ? Hungry. ? Worried or nervous (anxious). ? Sweaty and clammy. ? Confused. ? Dizzy. ? Sleepy. ? Sick to your stomach (nauseous).  Having: ? A fast heartbeat. ? A headache. ? A change in your vision. ? Jerky movements that you cannot control (seizure). ? Tingling or no feeling (numbness) around your mouth, lips, or tongue.  Having trouble with: ? Moving (coordination). ? Sleeping. ? Passing out (fainting). ? Getting upset easily (irritability). Treating low blood sugar To treat low blood sugar, eat or drink something sugary right away. If you can think clearly and swallow safely, follow the 15:15 rule:  Take 15 grams of a fast-acting carb (carbohydrate). Talk with your  doctor about how much you should take.  Some fast-acting carbs are: ? Sugar tablets (glucose pills). Take 3-4 pills. ? 6-8 pieces of hard candy. ? 4-6 oz (120-150 mL) of fruit juice. ? 4-6 oz (120-150 mL) of regular (not diet) soda. ? 1 Tbsp (15 mL) honey or sugar.  Check your blood sugar 15 minutes after you take the carb.  If your blood sugar is still at or below 70 mg/dL (3.9 mmol/L), take 15 grams of a carb again.  If your blood sugar does not go above 70 mg/dL (3.9 mmol/L) after 3 tries, get help right away.  After your blood sugar goes back to normal, eat a meal or a snack within 1 hour. Treating very low blood sugar If your blood sugar is at or below 54 mg/dL (3 mmol/L), you have very low blood sugar (severe hypoglycemia). This is an emergency. Do not wait to see if the symptoms will go away. Get medical help right away. Call your local emergency services (911 in the [place] If you have very low blood sugar and you cannot eat or drink, you may need a glucagon shot (injection). A family member or friend should learn how to check your blood sugar and how to give you a glucagon shot. Ask your doctor if you need to have a glucagon shot kit at home. Follow these instructions at home: Medicine  Take insulin and diabetes medicines as told.  If your doctor says you should take more or less insulin and medicines, do this exactly as told.  Do not run out of insulin or medicines. Having diabetes can raise your risk for other long-term conditions. These include heart disease and kidney disease. Your doctor may prescribe medicines to help you not have these problems. Food   Make healthy food choices. These include: ? Chicken, fish, egg whites, and beans. ? Oats, whole wheat, bulgur, brown rice, quinoa, and millet. ? Fresh fruits and vegetables. ? Low-fat dairy products. ? Nuts, avocado, olive oil, and canola oil.  Meet with a food specialist (dietitian). He or she can help you make an  eating plan that is right for you.  Follow instructions from your doctor about what you cannot eat or drink.  Drink enough fluid to keep your pee (urine) pale yellow.  Keep track of carbs that you eat. Do this by reading food labels and learning food serving sizes.  Follow your sick day plan when you cannot eat or drink normally. Make this plan with your doctor so it is ready to use. Activity  Exercise 3 or more times a week.  Do not go more than 2 days without exercising.  Talk with your doctor before you start a new exercise. Your doctor may need to tell you to change: ? How much insulin or medicines you take. ? How much food you eat. Lifestyle  Do not use any tobacco products. These include cigarettes, chewing tobacco, and e-cigarettes. If you need help quitting, ask your doctor.  Ask your doctor how much alcohol is safe for you.  Learn to deal with stress. If you need help with this, ask your doctor. Body care   Stay up to date with your shots (immunizations).  Have your eyes and feet checked by a doctor as often as told.  Check your skin and feet every day. Check for cuts, bruises, redness, blisters, or sores.  Brush your teeth and gums two times a day. Floss one or more times a day.  Go to the dentist one or more times every 6 months.  Stay at a healthy weight. General instructions  Take over-the-counter and prescription medicines only as told by your doctor.  Share your diabetes care plan with: ? Your work or school. ? People you live with.  Carry a card or wear jewelry that says you have diabetes.  Keep all follow-up visits as told by your doctor. This is important. Questions to ask your doctor  Do I need to meet with a diabetes educator?  Where can I find a support group for people with diabetes? Where to find more information To learn more about diabetes, visit:  American Diabetes Association: www.diabetes.org  American Association of Diabetes  Educators: www.diabeteseducator.org Summary  When you have type 2 diabetes, you must make sure your blood sugar (glucose) stays in a healthy range.  Check your blood sugar every day, as often as told.  Having diabetes can raise your risk for other conditions. Your doctor may prescribe medicines to help you not have these problems.  Keep all follow-up visits as told by your doctor. This is important. This information is not intended to replace advice given to you by your health care provider. Make sure you discuss any questions you have with your health care provider. Document Revised: 06/28/2017 Document Reviewed: 02/08/2015 Elsevier Patient Education  2020 [employer]   Diabetes Mellitus and Nutrition, Adult When you have diabetes (diabetes mellitus), it is very important to have healthy eating habits because your blood sugar (glucose) levels are greatly affected by what you   eat and drink. Eating healthy foods in the appropriate amounts, at about the same times every day, can help you:  Control your blood glucose.  Lower your risk of heart disease.  Improve your blood pressure.  Reach or maintain a healthy weight. Every person with diabetes is different, and each person has different needs for a meal plan. Your health care provider may recommend that you work with a diet and nutrition specialist (dietitian) to make a meal plan that is best for you. Your meal plan may vary depending on factors such as:  The calories you need.  The medicines you take.  Your weight.  Your blood glucose, blood pressure, and cholesterol levels.  Your activity level.  Other health conditions you have, such as heart or kidney disease. How do carbohydrates affect me? Carbohydrates, also called carbs, affect your blood glucose level more than any other type of food. Eating carbs naturally raises the amount of glucose in your blood. Carb counting is a method for keeping track of how many carbs you  eat. Counting carbs is important to keep your blood glucose at a healthy level, especially if you use insulin or take certain oral diabetes medicines. It is important to know how many carbs you can safely have in each meal. This is different for every person. Your dietitian can help you calculate how many carbs you should have at each meal and for each snack. Foods that contain carbs include:  Bread, cereal, rice, pasta, and crackers.  Potatoes and corn.  Peas, beans, and lentils.  Milk and yogurt.  Fruit and juice.  Desserts, such as cakes, cookies, ice cream, and candy. How does alcohol affect me? Alcohol can cause a sudden decrease in blood glucose (hypoglycemia), especially if you use insulin or take certain oral diabetes medicines. Hypoglycemia can be a life-threatening condition. Symptoms of hypoglycemia (sleepiness, dizziness, and confusion) are similar to symptoms of having too much alcohol. If your health care provider says that alcohol is safe for you, follow these guidelines:  Limit alcohol intake to no more than 1 drink per day for nonpregnant women and 2 drinks per day for men. One drink equals 12 oz of beer, 5 oz of wine, or 1 oz of hard liquor.  Do not drink on an empty stomach.  Keep yourself hydrated with water, diet soda, or unsweetened iced tea.  Keep in mind that regular soda, juice, and other mixers may contain a lot of sugar and must be counted as carbs. What are tips for following this plan?  Reading food labels  Start by checking the serving size on the "Nutrition Facts" label of packaged foods and drinks. The amount of calories, carbs, fats, and other nutrients listed on the label is based on one serving of the item. Many items contain more than one serving per package.  Check the total grams (g) of carbs in one serving. You can calculate the number of servings of carbs in one serving by dividing the total carbs by 15. For example, if a food has 30 g of total  carbs, it would be equal to 2 servings of carbs.  Check the number of grams (g) of saturated and trans fats in one serving. Choose foods that have low or no amount of these fats.  Check the number of milligrams (mg) of salt (sodium) in one serving. Most people should limit total sodium intake to less than 2,300 mg per day.  Always check the nutrition information of foods labeled   as "low-fat" or "nonfat". These foods may be higher in added sugar or refined carbs and should be avoided.  Talk to your dietitian to identify your daily goals for nutrients listed on the label. Shopping  Avoid buying canned, premade, or processed foods. These foods tend to be high in fat, sodium, and added sugar.  Shop around the outside edge of the grocery store. This includes fresh fruits and vegetables, bulk grains, fresh meats, and fresh dairy. Cooking  Use low-heat cooking methods, such as baking, instead of high-heat cooking methods like deep frying.  Cook using healthy oils, such as olive, canola, or sunflower oil.  Avoid cooking with butter, cream, or high-fat meats. Meal planning  Eat meals and snacks regularly, preferably at the same times every day. Avoid going long periods of time without eating.  Eat foods high in fiber, such as fresh fruits, vegetables, beans, and whole grains. Talk to your dietitian about how many servings of carbs you can eat at each meal.  Eat 4-6 ounces (oz) of lean protein each day, such as lean meat, chicken, fish, eggs, or tofu. One oz of lean protein is equal to: ? 1 oz of meat, chicken, or fish. ? 1 egg. ?  cup of tofu.  Eat some foods each day that contain healthy fats, such as avocado, nuts, seeds, and fish. Lifestyle  Check your blood glucose regularly.  Exercise regularly as told by your health care provider. This may include: ? 150 minutes of moderate-intensity or vigorous-intensity exercise each week. This could be brisk walking, biking, or water  aerobics. ? Stretching and doing strength exercises, such as yoga or weightlifting, at least 2 times a week.  Take medicines as told by your health care provider.  Do not use any products that contain nicotine or tobacco, such as cigarettes and e-cigarettes. If you need help quitting, ask your health care provider.  Work with a counselor or diabetes educator to identify strategies to manage stress and any emotional and social challenges. Questions to ask a health care provider  Do I need to meet with a diabetes educator?  Do I need to meet with a dietitian?  What number can I call if I have questions?  When are the best times to check my blood glucose? Where to find more information:  American Diabetes Association: diabetes.org  Academy of Nutrition and Dietetics: www.eatright.org  National Institute of Diabetes and Digestive and Kidney Diseases (NIH): www.niddk.nih.gov Summary  A healthy meal plan will help you control your blood glucose and maintain a healthy lifestyle.  Working with a diet and nutrition specialist (dietitian) can help you make a meal plan that is best for you.  Keep in mind that carbohydrates (carbs) and alcohol have immediate effects on your blood glucose levels. It is important to count carbs and to use alcohol carefully. This information is not intended to replace advice given to you by your health care provider. Make sure you discuss any questions you have with your health care provider. Document Revised: 12/18/2016 Document Reviewed: 02/10/2016 Elsevier Patient Education  2020 [employer].   

## 2019-09-14 NOTE — Addendum Note (Signed)
Addended by: Lennie Muckle on: 09/14/2019 02:25 PM   Modules accepted: Orders

## 2019-09-18 ENCOUNTER — Encounter: Payer: Self-pay | Admitting: Internal Medicine

## 2019-09-19 ENCOUNTER — Ambulatory Visit: Payer: Medicare HMO | Admitting: Endocrinology

## 2019-09-20 NOTE — Progress Notes (Signed)
Patient ID: [name], female   DOB: May 24, 1965, 54 y.o.   MRN: [id number]          Reason for Appointment: Follow-up for Type 2 Diabetes   History of Present Illness:          Date of diagnosis of type 2 diabetes mellitus:   1989      Background history:   She thinks she was initially treated with diet alone and subsequently given metformin With metformin she had nausea and diarrhea but she does not know if she was treated with any other medications until she was started on insulin in 2003.  Also was on glipizide in 2015 Most likely she was on Levemir or Lantus insulin for quite some time She was then switched to Toujeo in 2016 and although she was initially prescribed 90 units twice a day this was subsequently lowered  Her A1c has been occasionally between 7.2 and 7.5 but has been consistently over 9% since 2018  Recent history:   INSULIN regimen is:80 U Toujeo hs, Novolog 2-12 units before meals based on blood sugar      Non-insulin hypoglycemic drugs the patient is taking are: Trulicity 1.5 mg weekly  Her A1c is most recently 9.4%, previous range 8.9-12.6  Current management, blood sugar patterns and problems identified:  She has comes back now about over 2 years after her initial consultation in 2019  She was started on Trulicity at that time and she is now taking 1.5 mg weekly  She thinks that earlier this year she was admitted to the hospital with low blood sugars and her insulin doses were reduced  Currently she is taking NOVOLOG only based on blood sugar readings if it is over 150  Appears not to be checking her blood sugars much except in the morning and sometimes before lunch  Her weight has gone up at least 15 pounds since her last visit  She is taking her Toujeo regularly  Does not think she has gone off her diet and avoid drinks with sugar  FASTING blood sugars appear to be persistently high  Also if she has checked any readings before bedtime  they are also significantly high  She thinks she takes her bedtime Toujeo consistently  She says she walks 15 minutes in the mornings with a moderate pace         Side effects from medications have been:metformin  Compliance with the medical regimen:  Hypoglycemia:    Glucose monitoring:  done <1 times a day         Glucometer: One Touch Verio.       Blood Glucose readings by time of day and averages from meter download:   PRE-MEAL Fasting Lunch Dinner  overnight Overall  Glucose range:  165-269   180  150, 345   Mean/median:  212     250   POST-MEAL PC Breakfast PC Lunch  at bedtime  Glucose range:  281-302  271, 355  314-318  Mean/median:    316     Self-care: The diet thas the patient has been following is: tries to limit .     Meal times are: 6 7 7  AM, 1-2 PM and 5-6 PM                Dietician visit, most recent: 9/18               Exercise:  Minimal, does have exercise bike at home  Weight history:  Previous range 219-230  Wt Readings from Last 3 Encounters:  09/21/19 244 lb (110.7 kg)  09/14/19 240 lb 12.8 oz (109.2 kg)  06/01/19 240 lb 8 oz (109.1 kg)    Glycemic control:    Lab Results  Component Value Date   HGBA1C 9.4 (A) 09/14/2019   HGBA1C 8.9 (H) 04/28/2019   HGBA1C 12.6 (H) 01/24/2018   Lab Results  Component Value Date   MICROALBUR 20.3 (H) 09/21/2019   LDLCALC 92 04/28/2019   CREATININE 0.82 09/21/2019   Lab Results  Component Value Date   MICRALBCREAT 21.8 09/21/2019    Lab Results  Component Value Date   FRUCTOSAMINE CANCELED 07/26/2015   FRUCTOSAMINE 264 07/26/2015      Allergies as of 09/21/2019      Reactions   Codeine Swelling, Other (See Comments)   Swelling and burning of mouth (inside)   Lactose Intolerance (gi) Nausea And Vomiting      Medication List       Accurate as of September 21, 2019  1:56 PM. If you have any questions, ask your nurse or doctor.        albuterol 108 (90 Base) MCG/ACT inhaler Commonly  known as: ProAir HFA INHALE 2 PUFFS BY MOUTH EVERY 6 HOURS AS NEEDED FOR WHEEZING OR SHORTNESS OF BREATH   albuterol 1.25 MG/3ML nebulizer solution Commonly known as: ACCUNEB Inhale into the lungs. Use 3 mls by nebulization every 6 hours as needed for wheezing   budesonide 0.5 MG/2ML nebulizer solution Commonly known as: PULMICORT Inhale 0.5 mg into the lungs daily. Take 2 mls by nebulizations once daily   CALCIUM-VITAMIN D3 PO D3 500- calcium 630 mg Take 1 capsule by mouth once daily   carvedilol 12.5 MG tablet Commonly known as: COREG Take 1 tablet (12.5 mg total) by mouth 2 (two) times daily after a meal.   clopidogrel 75 MG tablet Commonly known as: PLAVIX Take 1 tablet (75 mg total) by mouth daily.   cycloSPORINE 0.05 % ophthalmic emulsion Commonly known as: RESTASIS Place 2 drops into both eyes 2 (two) times daily.   dapagliflozin propanediol 5 MG Tabs tablet Commonly known as: Farxiga Take 1 tablet (5 mg total) by mouth daily. Started by: Elayne Snare, MD   DULoxetine 20 MG capsule Commonly known as: Cymbalta Take 2 capsules q am and 1 capsule q pm   Dupixent 300 MG/2ML Sopn Generic drug: Dupilumab Inject 300 mg into the skin every 14 (fourteen) days.   EPINEPHrine 0.3 mg/0.3 mL Soaj injection Commonly known as: EPI-PEN Inject 0.3 mg into the muscle as needed for anaphylaxis.   ezetimibe 10 MG tablet Commonly known as: Zetia Take 1 tablet (10 mg total) by mouth daily.   fluticasone 50 MCG/ACT nasal spray Commonly known as: FLONASE Place 2 sprays into both nostrils daily.   gabapentin 400 MG capsule Commonly known as: NEURONTIN 400 mg qAM, 400 mg qPM, 800 mg qhs   insulin glargine (2 Unit Dial) 300 UNIT/ML Solostar Pen Commonly known as: TOUJEO MAX Hold your long-acting until followup with your outpatient endocrinology provider. What changed:   how much to take  how to take this  when to take this  additional instructions   levETIRAcetam 500 MG  tablet Commonly known as: KEPPRA Take 500 mg by mouth 2 (two) times daily. 1 TAB IN THE MORNING AND .5 TABS AT NIGHT   losartan 100 MG tablet Commonly known as: COZAAR Take 1 tablet (100 mg total) by mouth daily.  mirabegron ER 50 MG Tb24 tablet Commonly known as: Myrbetriq Take 1 tablet (50 mg total) by mouth daily.   nitroGLYCERIN 0.4 MG SL tablet Commonly known as: NITROSTAT Place 1 tablet (0.4 mg total) under the tongue every 5 (five) minutes as needed for chest pain.   ondansetron 4 MG disintegrating tablet Commonly known as: Zofran ODT Take 1 tablet (4 mg total) by mouth every 8 (eight) hours as needed for nausea or vomiting.   pantoprazole 40 MG tablet Commonly known as: PROTONIX Take 1 tablet (40 mg total) by mouth daily.   polyethylene glycol 17 g packet Commonly known as: MIRALAX / GLYCOLAX Take 17 g by mouth daily as needed for mild constipation.   potassium chloride SA 20 MEQ tablet Commonly known as: Klor-Con M20 You have not needed potassium while in the hospital.  Hold until outpatient followup.   simvastatin 40 MG tablet Commonly known as: ZOCOR Take 40 mg by mouth daily.   spironolactone 25 MG tablet Commonly known as: ALDACTONE Take 1 tablet by mouth daily.   terconazole 0.4 % vaginal cream Commonly known as: TERAZOL 7 Place 1 applicator vaginally at bedtime as needed (irritation).   Trulicity 1.5 YF/1.1MY Sopn Generic drug: Dulaglutide Inject 1.5 mg into the skin once a week.   Wixela Inhub 100-50 MCG/DOSE Aepb Generic drug: Fluticasone-Salmeterol Inhale 1 puff into the lungs 2 (two) times daily.       Allergies:  Allergies  Allergen Reactions  . Codeine Swelling and Other (See Comments)    Swelling and burning of mouth (inside)  . Lactose Intolerance (Gi) Nausea And Vomiting    Past Medical History:  Diagnosis Date  . Anxiety and depression 09/13/2006   Qualifier: Diagnosis of  By: Hassell Done [name], Tori Milks    . Arthritis    joint  pain   . Asthma   . COPD (chronic obstructive pulmonary disease) (HCC)    chronic bronchitis   . Depression   . Diabetes mellitus    since age 15; type 2 IDDM  . Diabetic neuropathy, painful ([place])    FEET AND HANDS  . Diabetic retinopathy ([place])   . Diastolic dysfunction    a.  Echo 11/17: EF 60-65%, mild LVH, no RWMA, Gr1DD, mild AI, dilated aortic root measuring 38 mm, mildly dilated ascending aorta  . Dilated aortic root ([place])    37m by echo 11/2015  . Dizziness    secondary to diabetes and hypertension   . Epilepsy idiopathic petit mal ([place]    last seizure 2012;controlled w/ topomax  . GERD (gastroesophageal reflux disease)   . Glaucoma    NOT ON ANY EYE DROPS   . Headache(784.0)    migraines   . Heart murmur    born with   . History of stress test    a. 11/17: Normal perfusion, EF 53%, normal study  . Hx of blood clots    hematomas removed from left side of brain from 124moto [id number]   . Hyperlipidemia   . Hypertension   . Legally blind    left eye   . MIGRAINE HEADACHE 09/14/2006   Qualifier: Diagnosis of  By: MarHassell DoneP, NykTori Milks . Myocardial infarction ([facility]  a.  Patient reported history of without objective documentation  . Nephrolithiasis    frequent urination , urination at nite  PT SEEN IN ER 09/22/11 FOR BACK AND RT SIDED PAIN--HAS KNOWN STONE RT URETER AND UA IN ER SHOWED UTI  .  Pain 09/23/11   LOWER BACK AND RIGHT SIDE--PT HAS RIGHT URETERAL STONE  . Pneumonia    hx of 2009  . Pyelonephritis   . Seizures ([place])   . Sleep apnea    [facility] not [facility] ; mild  . Stress incontinence   . Stroke [facility])    last 2003  RESIDUAL LEFT LEG WEAKNESS--NO OTHER RESIDUAL PROBLEMS    Past Surgical History:  Procedure Laterality Date  . BRAIN HEMATOMA EVACUATION     five procedures total, first procedure when 69 months old, last at 54 years of age.  . CYSTOSCOPY W/ RETROGRADES  09/24/2011   Procedure: CYSTOSCOPY WITH RETROGRADE  PYELOGRAM;  Surgeon: Malka So, MD;  Location: [facility];  Service: Urology;  Laterality: Right;  . CYSTOSCOPY WITH URETEROSCOPY  09/24/2011   Procedure: CYSTOSCOPY WITH URETEROSCOPY;  Surgeon: Malka So, MD;  Location: [facility];  Service: Urology;  Laterality: Right;  Balloon dilation right ureter   . CYSTOSCOPY/RETROGRADE/URETEROSCOPY  08/24/2011   Procedure: CYSTOSCOPY/RETROGRADE/URETEROSCOPY;  Surgeon: Molli Hazard, MD;  Location: [facility];  Service: Urology;  Laterality: Right;  Cysto, Right retrograde Pyelogram, right stent placement.   Fatima Blank HERNIA REPAIR  05/05/2011   Procedure: LAPAROSCOPIC INCISIONAL HERNIA;  Surgeon: Rolm Bookbinder, MD;  Location: [place];  Service: General;  Laterality: N/A;  . kidney stone removal    . MASS EXCISION  05/05/2011   Procedure: EXCISION MASS;  Surgeon: Rolm Bookbinder, MD;  Location: [place];  Service: General;  Laterality: Right;  . PCNL    . RETINAL DETACHMENT SURGERY Left 1990  . RIGHT/LEFT HEART CATH AND CORONARY ANGIOGRAPHY N/A 04/28/2019   Procedure: RIGHT/LEFT HEART CATH AND CORONARY ANGIOGRAPHY;  Surgeon: Yolonda Kida, MD;  Location: [place] [facility];  Service: Cardiovascular;  Laterality: N/A;  . SHUNT REMOVAL     shunt inserted at age 76 removed at age 85   . VAGINAL HYSTERECTOMY  1996    Family History  Problem Relation Age of Onset  . Uterine cancer Mother   . Hypertension Mother   . Cancer Mother   . Brain cancer Maternal Grandmother   . Hypertension Maternal Grandmother   . Birth defects Daughter   . Hypertension Daughter   . Cirrhosis Maternal Grandfather   . ADD / ADHD Maternal Grandfather   . Birth defects Maternal Grandfather   . Diabetes Maternal Grandfather   . Anesthesia problems Neg Hx   . Colon cancer Neg Hx   . Esophageal cancer Neg Hx   . Pancreatic cancer Neg Hx     Social History:  reports that she quit smoking about 32 years ago. Her smoking use included cigarettes. She has a 15.00 pack-year  smoking history. She has never used smokeless tobacco. She reports that she does not drink alcohol and does not use drugs.   Review of Systems  Constitutional: Positive for weight gain.  Eyes: Negative for blurred vision.  Respiratory:       On treatment for COPD  Gastrointestinal: Negative for constipation and diarrhea.  Endocrine: Negative for polydipsia.  Genitourinary:       No recent yeast infections  Musculoskeletal: Negative for joint pain.  Neurological: Negative for numbness and tingling.     Lipid history: Is being treated with simvastatin 40 mg daily    Lab Results  Component Value Date   CHOL 155 04/28/2019   HDL 43 04/28/2019   LDLCALC 92 04/28/2019   TRIG 102 04/28/2019  CHOLHDL 3.6 04/28/2019           Hypertension: Taking Coreg, Losartan  BP Readings from Last 3 Encounters:  09/21/19 (!) 174/104  09/14/19 (!) 144/84  06/01/19 140/88   Hypokalemia: She has been given 40 mEq of potassium along with 25 mg spironolactone for treatment by PCP  Lab Results  Component Value Date   K 3.6 09/21/2019    Most recent eye exam was in 8/21  Most recent foot exam: 7/85  Diabetic complications: History of neuropathy, microalbuminuria and background retinopathy  She will occasionally take prednisone for her asthma, none for the last 2 months  LABS:  Office Visit on 09/21/2019  Component Date Value Ref Range Status  . Color, Urine 09/21/2019 YELLOW  Yellow;Lt. Yellow;Straw;Dark Yellow;Amber;Green;Red;Brown Final  . APPearance 09/21/2019 Sl Cloudy* Clear;Turbid;Slightly Cloudy;Cloudy Final  . Specific Gravity, Urine 09/21/2019 1.020  1.000 - 1.030 Final  . pH 09/21/2019 6.0  5.0 - 8.0 Final  . Total Protein, Urine 09/21/2019 TRACE* Negative Final  . Urine Glucose 09/21/2019 NEGATIVE  Negative Final  . Ketones, ur 09/21/2019 NEGATIVE  Negative Final  . Bilirubin Urine 09/21/2019 NEGATIVE  Negative Final  . Hgb urine dipstick 09/21/2019 SMALL* Negative Final   . Urobilinogen, UA 09/21/2019 0.2  0.0 - 1.0 Final  . Leukocytes,Ua 09/21/2019 TRACE* Negative Final  . Nitrite 09/21/2019 POSITIVE* Negative Final  . WBC, UA 09/21/2019 11-20/hpf* 0-2/hpf Final  . RBC / HPF 09/21/2019 3-6/hpf* 0-2/hpf Final  . Squamous Epithelial / LPF 09/21/2019 Rare(0-4/hpf)  Rare(0-4/hpf) Final  . Bacteria, UA 09/21/2019 2+* None Final  . Microalb, Ur 09/21/2019 20.3* 0.0 - 1.9 mg/dL Final  . Creatinine,U 09/21/2019 93.1  mg/dL Final  . Microalb Creat Ratio 09/21/2019 21.8  0.0 - 30.0 mg/g Final  . Sodium 09/21/2019 140  135 - 145 mEq/L Final  . Potassium 09/21/2019 3.6  3.5 - 5.1 mEq/L Final  . Chloride 09/21/2019 103  96 - 112 mEq/L Final  . CO2 09/21/2019 28  19 - 32 mEq/L Final  . Glucose, Bld 09/21/2019 91  70 - 99 mg/dL Final  . BUN 09/21/2019 11  6 - 23 mg/dL Final  . Creatinine, Ser 09/21/2019 0.82  0.40 - 1.20 mg/dL Final  . Total Bilirubin 09/21/2019 0.3  0.2 - 1.2 mg/dL Final  . Alkaline Phosphatase 09/21/2019 91  39 - 117 U/L Final  . AST 09/21/2019 12  0 - 37 U/L Final  . ALT 09/21/2019 14  0 - 35 U/L Final  . Total Protein 09/21/2019 8.1  6.0 - 8.3 g/dL Final  . Albumin 09/21/2019 4.5  3.5 - 5.2 g/dL Final  . GFR 09/21/2019 72.38  >60.00 mL/min Final  . Calcium 09/21/2019 9.7  8.4 - 10.5 mg/dL Final    Physical Examination:  BP (!) 174/104 (BP Location: Left Arm, Patient Position: Sitting, Cuff Size: Large)   Pulse 89   Ht 5' 5"  (1.651 m)   Wt 244 lb (110.7 kg)   LMP  (LMP Unknown)   SpO2 96%   BMI 40.60 kg/m       ASSESSMENT:  Diabetes type 2, uncontrolled, insulin-dependent  See history of present illness for detailed discussion of current diabetes management, blood sugar patterns and problems identified     Her A1c is last 9.4  She is on relatively large doses of basal insulin, minimal bolus insulin and Trulicity Has not had any significant improvement of her control, any weight loss or insulin requirement with Trulicity that  was started  2 years ago However she did experience hypoglycemia in April this was transient and not clear why  Also is a good candidate for an insulin pump because of her large insulin requirement, alternatively will benefit from U-500 insulin  History of microvascular disease and also CAD  Hypertension which is poorly controlled currently although not consistently Possible that she has hyperaldosteronism because of history of hypokalemia   PLAN:    She is a good candidate for an SGLT2 drug like [place] Discussed action of SGLT 2 drugs on lowering glucose by decreasing kidney absorption of glucose, benefits of weight loss and lower blood pressure, possible side effects including candidiasis and dosage regimen  Prescription for 5 mg Farxiga given along with co-pay card and patient information She can stop TRULICITY since it does not appear to be benefiting her  She will need to increase her Toujeo progressively and will go up to 90 units for now  Given her titration sheet to adjust the dose by 4 units every 3 days until morning sugars are below 130  She will take an average of 20 units of NovoLog at mealtime regardless of Premeal blood sugar based on home meal size and carbohydrate content, may take up to 25 minutes  Consider follow-up nutrition consultation again  More consistent POSTPRANDIAL blood sugar monitoring  Needs urine microalbumin checked  Also benefits of insulin pump usage and showed her the Omnipod insulin pump to consider.  Will verify her insurance benefits and use this if modality instead of multiple injections, especially to reduce insulin requirement and better postprandial control  Consider freestyle libre system on the next visit  We will repeat potassium level today and if getting low will consider aldosterone/renin ratio especially blood pressure is not improved with Farxiga  Follow-up in 4 weeks with repeat BMP and fructosamine  Patient Instructions   NoVOLOG 15-25 U BEFORE MEALS  Check blood sugars on waking up 3-4  days a week  Also check blood sugars about 2 hours after meals and do this after different meals by rotation  Recommended blood sugar levels on waking up are 90-130 and about 2 hours after meal is 130-160  Please bring your blood sugar monitor to each visit, thank you  Do not renew Trulicity   Total visit time including counseling = 40 minutes   Elayne Snare 09/21/2019, 1:56 PM   Note: This office note was prepared with Dragon voice recognition system technology. Any transcriptional errors that result from this process are unintentional.

## 2019-09-21 ENCOUNTER — Other Ambulatory Visit: Payer: Self-pay

## 2019-09-21 ENCOUNTER — Ambulatory Visit (INDEPENDENT_AMBULATORY_CARE_PROVIDER_SITE_OTHER): Payer: Medicare Other | Admitting: Endocrinology

## 2019-09-21 ENCOUNTER — Encounter: Payer: Self-pay | Admitting: Endocrinology

## 2019-09-21 VITALS — BP 174/104 | HR 89 | Ht 65.0 in | Wt 244.0 lb

## 2019-09-21 DIAGNOSIS — R8271 Bacteriuria: Secondary | ICD-10-CM

## 2019-09-21 DIAGNOSIS — E1165 Type 2 diabetes mellitus with hyperglycemia: Secondary | ICD-10-CM

## 2019-09-21 DIAGNOSIS — Z794 Long term (current) use of insulin: Secondary | ICD-10-CM

## 2019-09-21 DIAGNOSIS — I10 Essential (primary) hypertension: Secondary | ICD-10-CM | POA: Diagnosis not present

## 2019-09-21 DIAGNOSIS — E876 Hypokalemia: Secondary | ICD-10-CM | POA: Diagnosis not present

## 2019-09-21 DIAGNOSIS — R8281 Pyuria: Secondary | ICD-10-CM

## 2019-09-21 LAB — URINALYSIS, ROUTINE W REFLEX MICROSCOPIC
Bilirubin Urine: NEGATIVE
Ketones, ur: NEGATIVE
Nitrite: POSITIVE — AB
Specific Gravity, Urine: 1.02 (ref 1.000–1.030)
Urine Glucose: NEGATIVE
Urobilinogen, UA: 0.2 (ref 0.0–1.0)
pH: 6 (ref 5.0–8.0)

## 2019-09-21 LAB — MICROALBUMIN / CREATININE URINE RATIO
Creatinine,U: 93.1 mg/dL
Microalb Creat Ratio: 21.8 mg/g (ref 0.0–30.0)
Microalb, Ur: 20.3 mg/dL — ABNORMAL HIGH (ref 0.0–1.9)

## 2019-09-21 LAB — COMPREHENSIVE METABOLIC PANEL
ALT: 14 U/L (ref 0–35)
AST: 12 U/L (ref 0–37)
Albumin: 4.5 g/dL (ref 3.5–5.2)
Alkaline Phosphatase: 91 U/L (ref 39–117)
BUN: 11 mg/dL (ref 6–23)
CO2: 28 mEq/L (ref 19–32)
Calcium: 9.7 mg/dL (ref 8.4–10.5)
Chloride: 103 mEq/L (ref 96–112)
Creatinine, Ser: 0.82 mg/dL (ref 0.40–1.20)
GFR: 72.38 mL/min (ref >60.00)
Glucose, Bld: 91 mg/dL (ref 70–99)
Potassium: 3.6 mEq/L (ref 3.5–5.1)
Sodium: 140 mEq/L (ref 135–145)
Total Bilirubin: 0.3 mg/dL (ref 0.2–1.2)
Total Protein: 8.1 g/dL (ref 6.0–8.3)

## 2019-09-21 MED ORDER — DAPAGLIFLOZIN PROPANEDIOL 5 MG PO TABS: 5.0000 mg | ORAL_TABLET | Freq: Every day | ORAL | 3 refills | Status: DC

## 2019-09-21 NOTE — Patient Instructions (Addendum)
NoVOLOG 15-25 U BEFORE MEALS  Check blood sugars on waking up 3-4  days a week  Also check blood sugars about 2 hours after meals and do this after different meals by rotation  Recommended blood sugar levels on waking up are 90-130 and about 2 hours after meal is 130-160  Please bring your blood sugar monitor to each visit, thank you  Do not renew Trulicity

## 2019-09-26 ENCOUNTER — Telehealth: Payer: Self-pay | Admitting: Endocrinology

## 2019-09-26 ENCOUNTER — Telehealth: Payer: Self-pay

## 2019-09-26 ENCOUNTER — Other Ambulatory Visit: Payer: Self-pay | Admitting: Internal Medicine

## 2019-09-26 DIAGNOSIS — N3000 Acute cystitis without hematuria: Secondary | ICD-10-CM

## 2019-09-26 LAB — URINE CULTURE

## 2019-09-26 MED ORDER — NITROFURANTOIN MONOHYD MACRO 100 MG PO CAPS: 100.0000 mg | ORAL_CAPSULE | Freq: Two times a day (BID) | ORAL | 0 refills | Status: AC

## 2019-09-26 NOTE — Progress Notes (Signed)
The urine culture from 09/21/2019 ordered through endocrinology returned positive for gram-negative rods. It was sensitive to nitrofurantoin, with a prescription for this medicine prescribed.

## 2019-09-26 NOTE — Telephone Encounter (Signed)
Please advise 

## 2019-09-26 NOTE — Telephone Encounter (Signed)
The amount of lactose in each tablet is minuscule and will not cause allergic reactions.  She does not have a true allergy but lactose intolerance.  We have no other options to help with her diabetes control

## 2019-09-26 NOTE — Telephone Encounter (Signed)
Patient called asking if Dr Dwyane Dee was aware that she had a Lactose allergy? She said there is Lactate in [place] and she doesn't think she can take it. Patient would like to be called at [phone]

## 2019-09-26 NOTE — Telephone Encounter (Signed)
Copied from [place] [phone]. Topic: General - Other >> Sep 26, 2019  3:18 PM Hinda Lenis D wrote: PT has question about two medications, she doesn't remember the name / pleae advise / nitrofurantoin, macrocrystal-monohydrate, (MACROBID) 100 MG capsule [[id number]]

## 2019-09-27 NOTE — Telephone Encounter (Signed)
Patient called to check on message from yesterday - I gave her Dr Ronnie Derby response to her question. Patient acknowledged and indicated understanding.

## 2019-09-30 ENCOUNTER — Other Ambulatory Visit: Payer: Self-pay | Admitting: Internal Medicine

## 2019-09-30 DIAGNOSIS — I10 Essential (primary) hypertension: Secondary | ICD-10-CM

## 2019-10-09 ENCOUNTER — Other Ambulatory Visit: Payer: Self-pay | Admitting: Internal Medicine

## 2019-10-09 DIAGNOSIS — G894 Chronic pain syndrome: Secondary | ICD-10-CM

## 2019-10-09 DIAGNOSIS — K219 Gastro-esophageal reflux disease without esophagitis: Secondary | ICD-10-CM

## 2019-10-09 DIAGNOSIS — I252 Old myocardial infarction: Secondary | ICD-10-CM

## 2019-10-09 DIAGNOSIS — M545 Low back pain, unspecified: Secondary | ICD-10-CM

## 2019-10-09 NOTE — Telephone Encounter (Signed)
Pt is no longer using [facility]. Pt would like refills on baclofen 10 mg pt takes 3 times a day, albuterol inhaler, plavix 75 mg, pantoprazole 40 mg. This medications is not on pt med list pt needs a refill on cimetidine 300  Mg twice a day. [facility] [address] [facility] [place]

## 2019-10-10 MED ORDER — CLOPIDOGREL BISULFATE 75 MG PO TABS: 75.0000 mg | ORAL_TABLET | Freq: Every day | ORAL | 0 refills | Status: AC

## 2019-10-10 MED ORDER — BACLOFEN 10 MG PO TABS: 10.0000 mg | ORAL_TABLET | Freq: Three times a day (TID) | ORAL | 6 refills | Status: DC

## 2019-10-10 MED ORDER — PANTOPRAZOLE SODIUM 40 MG PO TBEC: 40.0000 mg | DELAYED_RELEASE_TABLET | Freq: Every day | ORAL | 0 refills | Status: DC

## 2019-10-13 ENCOUNTER — Other Ambulatory Visit: Payer: Self-pay | Admitting: Endocrinology

## 2019-10-13 DIAGNOSIS — IMO0002 Reserved for concepts with insufficient information to code with codable children: Secondary | ICD-10-CM

## 2019-10-13 DIAGNOSIS — E1129 Type 2 diabetes mellitus with other diabetic kidney complication: Secondary | ICD-10-CM

## 2019-10-13 MED ORDER — INSULIN GLARGINE (2 UNIT DIAL) 300 UNIT/ML ~~LOC~~ SOPN: 90.0000 [IU] | PEN_INJECTOR | SUBCUTANEOUS | 1 refills | Status: DC

## 2019-10-16 ENCOUNTER — Telehealth: Payer: Self-pay | Admitting: Endocrinology

## 2019-10-16 NOTE — Telephone Encounter (Signed)
Patient called stating she needs the insulin that goes with the Omnipod - she was not sure the name of it, but she said it was not the toujeo.  [id number] [place]  [phone] Fax:  [phone]

## 2019-10-18 NOTE — Telephone Encounter (Signed)
We will prescribe the insulin when she comes in for training for the pump

## 2019-10-18 NOTE — Telephone Encounter (Signed)
Patient has been notified and will bring the pump to her appointment

## 2019-10-18 NOTE — Telephone Encounter (Signed)
Patient called back. She has received her [place] (pods and receiver in mail), but she needs the insulin to put in the pods.  There is no information for what insulin she needs.  Please revie, advise, and send RX to    [id number] [place]  [phone] Fax:  [phone]

## 2019-10-18 NOTE — Telephone Encounter (Signed)
Patient requests a call when it is determined which insulin she is supposed to be using - [phone]

## 2019-10-20 NOTE — Telephone Encounter (Signed)
She will need NovoLog bottle call in to bring with her for the pump start.  Directions will be used up to 50 units daily in the pump.  Please confirm that she has been scheduled for the pump training

## 2019-10-23 ENCOUNTER — Ambulatory Visit
Payer: Medicare Other | Attending: Student in an Organized Health Care Education/Training Program | Admitting: Student in an Organized Health Care Education/Training Program

## 2019-10-23 ENCOUNTER — Other Ambulatory Visit: Payer: Self-pay

## 2019-10-23 DIAGNOSIS — E1142 Type 2 diabetes mellitus with diabetic polyneuropathy: Secondary | ICD-10-CM

## 2019-10-23 DIAGNOSIS — G890 Central pain syndrome: Secondary | ICD-10-CM

## 2019-10-23 NOTE — Progress Notes (Signed)
I attempted to call the patient however no response. Voicemail left instructing patient to call front desk office at [phone] to reschedule appointment. -Dr Lateef  

## 2019-10-24 ENCOUNTER — Telehealth: Payer: Self-pay

## 2019-10-24 NOTE — Telephone Encounter (Signed)
No answer, left message to return  Call to review her pain and medications

## 2019-10-24 NOTE — Telephone Encounter (Signed)
If she is going to start the insulin pump next week this has to be coordinated with the [place] [facility] [place].  I will need to see her 2 days in a row starting the day after pump instructions by Mickel Baas.  She can be reached at [phone].  Otherwise cancel next week's appointment

## 2019-10-24 NOTE — Telephone Encounter (Signed)
Patient is aware and will contact OmniPod educator and contact us back to schedule with Dr. Dwyane Dee for follow up for 2 days

## 2019-10-25 ENCOUNTER — Other Ambulatory Visit: Payer: Self-pay

## 2019-10-25 ENCOUNTER — Encounter: Payer: Self-pay | Admitting: Student in an Organized Health Care Education/Training Program

## 2019-10-25 ENCOUNTER — Ambulatory Visit
Payer: Medicare Other | Attending: Student in an Organized Health Care Education/Training Program | Admitting: Student in an Organized Health Care Education/Training Program

## 2019-10-25 DIAGNOSIS — M791 Myalgia, unspecified site: Secondary | ICD-10-CM | POA: Diagnosis not present

## 2019-10-25 DIAGNOSIS — E1142 Type 2 diabetes mellitus with diabetic polyneuropathy: Secondary | ICD-10-CM

## 2019-10-25 DIAGNOSIS — G894 Chronic pain syndrome: Secondary | ICD-10-CM

## 2019-10-25 DIAGNOSIS — G890 Central pain syndrome: Secondary | ICD-10-CM

## 2019-10-25 MED ORDER — GABAPENTIN 600 MG PO TABS: ORAL_TABLET | ORAL | 2 refills | Status: DC

## 2019-10-25 NOTE — Progress Notes (Signed)
Patient: [name]  Service Category: E/M  Provider: Gillis Santa, MD  DOB: 12/25/1965  DOS: 10/25/2019  Location: Office  MRN: [id number]  Setting: Ambulatory outpatient  Referring Provider: Towanda Malkin*  Type: Established Patient  Specialty: Interventional Pain Management  PCP: Towanda Malkin, MD  Location: Home  Delivery: TeleHealth     Virtual Encounter - Pain Management PROVIDER NOTE: Information contained herein reflects review and annotations entered in association with encounter. Interpretation of such information and data should be left to medically-trained personnel. Information provided to patient can be located elsewhere in the medical record under "Patient Instructions". Document created using STT-dictation technology, any transcriptional errors that may result from process are unintentional.    Contact & Pharmacy Preferred: [phone] Home: [phone] (home) Mobile: There is no such number on file (mobile). E-mail: [email or web address]  [id number] [id number]: [phone]: [phone] Pre-screening  [name] offered "in-person" vs "virtual" encounter. She indicated preferring virtual for this encounter.   Reason COVID-19*  Social distancing based on CDC and AMA recommendations.   I contacted Karen Evenern 10/25/2019 via video conference.      I clearly identified myself as BiGillis SantaMD. I verified that I was speaking with the correct person using two identifiers (Name: LaKayin Ketteringand date of birth: 9/Apr 25, 1965  Consent I sought verbal advanced consent from Karen Eveneror virtual visit interactions. I informed [name] possible security and privacy concerns, risks, and limitations associated with providing "not-in-person" medical evaluation and management services. I also informed [name] the availability  of "in-person" appointments. Finally, I informed her that there would be a charge for the virtual visit and that she could be  personally, fully or partially, financially responsible for it. [name] understanding and agreed to proceed.   Historic Elements   [name] a [age].o. year old, female patient evaluated today after our last contact on 07/19/2019. [name] a past medical history of Anxiety and depression (09/13/2006), Arthritis, Asthma, COPD (chronic obstructive pulmonary disease) (HCL[place] Depression, Diabetes mellitus, Diabetic neuropathy, painful (HCFrew[place] Diabetic retinopathy (HCM[place] Diastolic dysfunction, Dilated aortic root (HCImbler Dizziness, Epilepsy idiopathic petit mal (HCHur[place] GERD (gastroesophageal reflux disease), Glaucoma, Headache(784.0), Heart murmur, History of stress test, blood clots, Hyperlipidemia, Hypertension, Legally blind, MIGRAINE HEADACHE (09/14/2006), Myocardial infarction ([facility] Nephrolithiasis, Pain (09/23/11), Pneumonia, Pyelonephritis, Seizures (HCEt[place] Sleep apnea, Stress incontinence, and Stroke (HCDu [place] She also  has a past surgical history that includes kidney stone removal; Brain hematoma evacuation; Shunt removal; Retinal detachment surgery (Left, 1990); Vaginal hysterectomy (1996); Incisional hernia repair (05/05/2011); Mass excision (05/05/2011); PCNL; Cystoscopy/retrograde/ureteroscopy (08/24/2011); Cystoscopy with ureteroscopy (09/24/2011); Cystoscopy w/ retrogrades (09/24/2011); and RIGHT/LEFT HEART CATH AND CORONARY ANGIOGRAPHY (N/A, 04/28/2019). [name] a current medication list which includes the following prescription(s): albuterol, albuterol, baclofen, budesonide, calcium carbonate-vitamin d, carvedilol, clopidogrel, cyclosporine, dapagliflozin propanediol, trulicity, duloxetine, dupixent, epinephrine, ezetimibe, fluticasone, fluticasone-salmeterol, gabapentin, insulin glargine (2 unit dial), levetiracetam, losartan, mirabegron er,  ondansetron, pantoprazole, polyethylene glycol, potassium chloride sa, simvastatin, spironolactone, and terconazole. She  reports that she quit smoking about 32 years ago. Her smoking use included cigarettes. She has a 15.00 pack-year smoking history. She has never used smokeless tobacco. She reports that she does not drink alcohol and does not use drugs. Ms. MoTofts allergic to codeine and lactose intolerance (gi).   HPI  Today, she is  being contacted for worsening of previously known (established) problem  [name] is a 54 year old female who endorses worsening pain in her left leg.  Patient has a complex medical history that includes multiple strokes with residual deficits.  Her last stroke was in 2013.  She is on Plavix for this and is high risk to be off anticoagulation.  For this reason, she is not a candidate for any interventional spine procedures given [place] anticoagulation guidelines.  She also has a history of seizures and diabetic neuropathy.  Her gabapentin was increased on 9/24 to 400 mg in the a.m., 400 mg in the afternoon and 800 mg at night.  She states that she is not finding much pain relief at the higher dose.  I instructed her to increase to 600 mg in the morning, 600 mg in the afternoon and 1200 mg at night.  Prescription sent as below.  Laboratory Chemistry Profile   Renal Lab Results  Component Value Date   BUN 11 09/21/2019   CREATININE 0.82 09/21/2019   LABCREA 48 18/29/9371   BCR NOT APPLICABLE 69/67/8938   GFR 72.38 09/21/2019   GFRAA >60 05/30/2019   GFRNONAA >60 05/30/2019     Hepatic Lab Results  Component Value Date   AST 12 09/21/2019   ALT 14 09/21/2019   ALBUMIN 4.5 09/21/2019   ALKPHOS 91 09/21/2019   LIPASE 45 05/16/2019     Electrolytes Lab Results  Component Value Date   NA 140 09/21/2019   K 3.6 09/21/2019   CL 103 09/21/2019   CALCIUM 9.7 09/21/2019   MG 2.1 05/21/2019     Bone Lab Results  Component Value Date   VD25OH 26 (L)  01/24/2018     Inflammation (CRP: Acute Phase) (ESR: Chronic Phase) Lab Results  Component Value Date   CRP 2.5 11/14/2018   ESRSEDRATE 29 11/14/2018   LATICACIDVEN 0.9 02/22/2014       Note: Above Lab results reviewed.  Imaging  DG Humerus Left CLINICAL DATA:  Left arm pain after possible fall  EXAM: LEFT HUMERUS - 2+ VIEW  COMPARISON:  None.  FINDINGS: There is no evidence of fracture or other focal bone lesions. Soft tissues are unremarkable.  IMPRESSION: Neg negative radiographs of the left humerus.  Electronically Signed   By: Audie Pinto M.D.   On: 05/15/2019 19:12 DG Chest 2 View CLINICAL DATA:  Hypoglycemia.  EXAM: CHEST - 2 VIEW  COMPARISON:  Chest radiograph 02/27/2019  FINDINGS: Stable cardiomediastinal contours. The lungs are clear. No pneumothorax or pleural effusion. No acute finding in the visualized skeleton.  IMPRESSION: No acute cardiopulmonary finding.  Electronically Signed   By: Audie Pinto M.D.   On: 05/15/2019 18:09  Assessment  The primary encounter diagnosis was Diabetic polyneuropathy associated with type 2 diabetes mellitus ([place]). Diagnoses of Central pain syndrome, Myalgia, and Chronic pain syndrome were also pertinent to this visit.  Plan of Care   [name] has a current medication list which includes the following long-term medication(s): albuterol, albuterol, budesonide, calcium carbonate-vitamin d, carvedilol, duloxetine, ezetimibe, fluticasone, fluticasone-salmeterol, gabapentin, insulin glargine (2 unit dial), levetiracetam, losartan, mirabegron er, pantoprazole, potassium chloride sa, and spironolactone.  Pharmacotherapy (Medications Ordered): Meds ordered this encounter  Medications  . gabapentin (NEURONTIN) 600 MG tablet    Sig: 600 mg in the morning, 600 mg in the afternoon, 1200 mg nightly.    Dispense:  120 tablet    Refill:  2    Follow-up plan:  Return if symptoms worsen or  fail to improve.   Recent Visits Date Type Provider Dept  10/23/19 Telemedicine Gillis Santa, MD [facility]  Showing recent visits within past 90 days and meeting all other requirements Today's Visits Date Type Provider Dept  10/25/19 Telemedicine Gillis Santa, MD [facility]  Showing today's visits and meeting all other requirements Future Appointments No visits were found meeting these conditions. Showing future appointments within next 90 days and meeting all other requirements  I discussed the assessment and treatment plan with the patient. The patient was provided an opportunity to ask questions and all were answered. The patient agreed with the plan and demonstrated an understanding of the instructions.  Patient advised to call back or seek an in-person evaluation if the symptoms or condition worsens.  Duration of encounter: 20 minutes.  Note by: Gillis Santa, MD Date: 10/25/2019; Time: 3:19 PM

## 2019-11-01 ENCOUNTER — Ambulatory Visit: Payer: Medicare Other | Admitting: Endocrinology

## 2019-11-13 ENCOUNTER — Telehealth: Payer: Self-pay | Admitting: Endocrinology

## 2019-11-13 NOTE — Telephone Encounter (Signed)
Patient was scheduled this morning with Irven [place] (omnipod) and then scheduled with Dr Dwyane Dee for tomorrow and Wednesday, patient forgot about her appointment for this morning and so I cancelled her appointments with Dr Dwyane Dee and we will need to give her a call to reschedule when Vaughan Basta gets back because there are no openings on Kumar's schedule for the next available that Mickel Baas would have.   Please contact patient to schedule this when you return. Ph# [phone]

## 2019-11-14 ENCOUNTER — Ambulatory Visit: Payer: Medicare Other | Admitting: Endocrinology

## 2019-11-15 ENCOUNTER — Ambulatory Visit: Payer: Medicare Other | Admitting: Endocrinology

## 2019-11-20 ENCOUNTER — Other Ambulatory Visit: Payer: Self-pay | Admitting: *Deleted

## 2019-11-20 MED ORDER — DAPAGLIFLOZIN PROPANEDIOL 5 MG PO TABS: 5.0000 mg | ORAL_TABLET | Freq: Every day | ORAL | 0 refills | Status: DC

## 2019-11-23 NOTE — Telephone Encounter (Signed)
Patient called re: Status of patient's prior message

## 2019-11-28 NOTE — Telephone Encounter (Signed)
Pt. Scheduled for pump start and with Dr. Dwyane Dee for Tue-THurs

## 2019-12-04 ENCOUNTER — Encounter: Payer: Medicare Other | Admitting: Nutrition

## 2019-12-05 ENCOUNTER — Telehealth: Payer: Self-pay

## 2019-12-05 ENCOUNTER — Ambulatory Visit: Payer: Medicare Other | Admitting: Endocrinology

## 2019-12-05 NOTE — Telephone Encounter (Signed)
A nurse from [facility] called and reported critical values on patient. She said that she preformed a ABI and she has sever Peripheral Vascular Disease in her right leg 1.9 and her left leg is normal. She has an appointment on 12/19/2019. Nurse said that she told the patient she needs to be seen right away. Called patient to have her schedule appointment.

## 2019-12-06 ENCOUNTER — Ambulatory Visit: Payer: Medicare Other | Admitting: Endocrinology

## 2019-12-12 ENCOUNTER — Other Ambulatory Visit: Payer: Self-pay

## 2019-12-12 ENCOUNTER — Ambulatory Visit: Payer: Medicare Other

## 2019-12-12 ENCOUNTER — Encounter: Payer: Self-pay | Admitting: Family Medicine

## 2019-12-12 ENCOUNTER — Ambulatory Visit (INDEPENDENT_AMBULATORY_CARE_PROVIDER_SITE_OTHER): Payer: Medicare Other | Admitting: Family Medicine

## 2019-12-12 VITALS — BP 136/84 | HR 95 | Temp 98.1°F | Resp 16 | Ht 65.0 in | Wt 251.5 lb

## 2019-12-12 DIAGNOSIS — Z794 Long term (current) use of insulin: Secondary | ICD-10-CM

## 2019-12-12 DIAGNOSIS — I739 Peripheral vascular disease, unspecified: Secondary | ICD-10-CM | POA: Diagnosis not present

## 2019-12-12 DIAGNOSIS — E1165 Type 2 diabetes mellitus with hyperglycemia: Secondary | ICD-10-CM | POA: Diagnosis not present

## 2019-12-12 DIAGNOSIS — M79606 Pain in leg, unspecified: Secondary | ICD-10-CM | POA: Diagnosis not present

## 2019-12-12 NOTE — Patient Instructions (Signed)
Peripheral Vascular Disease  Peripheral vascular disease (PVD) is a disease of the blood vessels that are not part of your heart and brain. A simple term for PVD is poor circulation. In most cases, PVD narrows the blood vessels that carry blood from your heart to the rest of your body. This can reduce the supply of blood to your arms, legs, and internal organs, like your stomach or kidneys. However, PVD most often affects a person's lower legs and feet. Without treatment, PVD tends to get worse. PVD can also lead to acute ischemic limb. This is when an arm or leg suddenly cannot get enough blood. This is a medical emergency. Follow these instructions at home: Lifestyle  Do not use any products that contain nicotine or tobacco, such as cigarettes and e-cigarettes. If you need help quitting, ask your doctor.  Lose weight if you are overweight. Or, stay at a healthy weight as told by your doctor.  Eat a diet that is low in fat and cholesterol. If you need help, ask your doctor.  Exercise regularly. Ask your doctor for activities that are right for you. General instructions  Take over-the-counter and prescription medicines only as told by your doctor.  Take good care of your feet: ? Wear comfortable shoes that fit well. ? Check your feet often for any cuts or sores.  Keep all follow-up visits as told by your doctor This is important. Contact a doctor if:  You have cramps in your legs when you walk.  You have leg pain when you are at rest.  You have coldness in a leg or foot.  Your skin changes.  You are unable to get or have an erection (erectile dysfunction).  You have cuts or sores on your feet that do not heal. Get help right away if:  Your arm or leg turns cold, numb, and blue.  Your arms or legs become red, warm, swollen, painful, or numb.  You have chest pain.  You have trouble breathing.  You suddenly have weakness in your face, arm, or leg.  You become very  confused or you cannot speak.  You suddenly have a very bad headache.  You suddenly cannot see. Summary  Peripheral vascular disease (PVD) is a disease of the blood vessels.  A simple term for PVD is poor circulation. Without treatment, PVD tends to get worse.  Treatment may include exercise, low fat and low cholesterol diet, and quitting smoking. This information is not intended to replace advice given to you by your health care provider. Make sure you discuss any questions you have with your health care provider. Document Revised: 12/18/2016 Document Reviewed: 02/13/2016 Elsevier Patient Education  2020 [employer].

## 2019-12-12 NOTE — Progress Notes (Signed)
Patient ID: [name], female    DOB: [birth year], 54 y.o.   MRN: [id number]  PCP: Towanda Malkin, MD  Chief Complaint  Patient presents with  . Leg Pain    right leg pain, per nurse that came out check for PAD    Subjective:   [name] is a 54 y.o. female, presents to clinic with CC of the following:  HPI  Here for leg pain and abnormal ABI testing done with home nurse visit Message received on 11/16:A nurse from [facility] [facility] called and reported critical values on patient. She said that she preformed a ABI and she has sever Peripheral Vascular Disease in her right leg 1.9 and her left leg is normal. She has an appointment on 12/19/2019. Nurse said that she told the patient she needs to be seen right away. Called patient to have her schedule appointment.  Appt today Pt note acute on chronic leg pain, much worse with any activity, can barely walk a few feet, she has many years hx of low back pain with sciatica - currently managed by pain management  Pt denies any change to appearance of LE's no change to skin color, hair, muscle mass/atrophy, no wounds ulcers, pallor, numbness or weakness.  When she walks pain occurs below the knee and down, no pain above.  Does improve a little with rest, she also has pain to left low back that radiates down left leg with walking    Patient Active Problem List   Diagnosis Date Noted  . Hypoglycemia 05/17/2019  . Hypoglycemia associated with type 2 diabetes mellitus ([place]) 05/16/2019  . Chest pain 04/27/2019  . Type 2 diabetes mellitus without complication, with long-term current use of insulin ([place]) 04/27/2019  . COPD (chronic obstructive pulmonary disease) ([place]) 04/27/2019  . CAD (coronary artery disease) 04/27/2019  . H/O: stroke with residual effects   . Myalgia 04/19/2019  . 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 Non-intractable vomiting 03/01/2019  . History of CVA (cerebrovascular accident) 07/08/2018  .  History of seizure 07/08/2018  . Diabetic polyneuropathy associated with type 2 diabetes mellitus ([place]) 03/02/2018  . Obesity (BMI 30-39.9) 03/02/2018  . Vitamin D deficiency 01/24/2018  . Vulvovaginal candidiasis 01/24/2018  . Hypokalemia 06/08/2017  . Overactive bladder 05/18/2017  . Asthma 06/22/2016  . Dilated aortic root ([place])   . Sacroiliac dysfunction 10/09/2014  . Chronic low back pain 09/03/2014  . CVA, old, hemiparesis ([place]) 09/03/2014  . OSA (obstructive sleep apnea) 05/15/2014  . Restrictive airway disease 05/15/2014  . Colitis 02/22/2014  . Type 2 diabetes, uncontrolled, with renal manifestation ([place]) 01/21/2014  . Chronic pain syndrome 01/21/2014  . Headache disorder 09/07/2013  . Diabetic neuropathy ([place]) 05/23/2013  . Mixed urge and stress incontinence 08/25/2011  . Ureteral stricture, right 08/25/2011  . Pyelonephritis 08/22/2011  . Nephrolithiasis 08/22/2011  . Incisional hernia 05/10/2011  . Morbid obesity ([place]) 05/10/2011  . Tachycardia 05/08/2011  . BLINDNESS, [place] DEFINITION 09/14/2006  . Hyperlipemia 09/13/2006  . Essential hypertension 09/13/2006  . MYOCARDIAL INFARCTION, HX OF 09/13/2006  . GERD 09/13/2006  . Seizure disorder ([place]) 09/13/2006      Current Outpatient Medications:  .  albuterol (ACCUNEB) 1.25 MG/3ML nebulizer solution, Inhale into the lungs. Use 3 mls by nebulization every 6 hours as needed for wheezing, Disp: , Rfl:  .  albuterol (PROAIR HFA) 108 (90 Base) MCG/ACT inhaler, INHALE 2 PUFFS BY MOUTH EVERY 6 HOURS AS NEEDED FOR WHEEZING OR SHORTNESS OF  BREATH, Disp: 3 Inhaler, Rfl: 3 .  baclofen (LIORESAL) 10 MG tablet, Take 1 tablet (10 mg total) by mouth 3 (three) times daily., Disp: 90 tablet, Rfl: 6 .  budesonide (PULMICORT) 0.5 MG/2ML nebulizer solution, Inhale 0.5 mg into the lungs daily. Take 2 mls by nebulizations once daily , Disp: , Rfl:  .  Calcium Carbonate-Vitamin D (CALCIUM-VITAMIN D3 PO), D3 500- calcium 630 mg Take 1  capsule by mouth once daily, Disp: , Rfl:  .  clopidogrel (PLAVIX) 75 MG tablet, Take 1 tablet (75 mg total) by mouth daily., Disp: 90 tablet, Rfl: 0 .  cycloSPORINE (RESTASIS) 0.05 % ophthalmic emulsion, Place 2 drops into both eyes 2 (two) times daily., Disp: , Rfl:  .  dapagliflozin propanediol (FARXIGA) 5 MG TABS tablet, Take 1 tablet (5 mg total) by mouth daily., Disp: 90 tablet, Rfl: 0 .  Dulaglutide (TRULICITY) 1.5 LP/3.7TK SOPN, Inject 1.5 mg into the skin once a week., Disp: , Rfl:  .  DULoxetine (CYMBALTA) 20 MG capsule, Take 2 capsules q am and 1 capsule q pm, Disp: 90 capsule, Rfl: 2 .  EPINEPHrine 0.3 mg/0.3 mL IJ SOAJ injection, Inject 0.3 mg into the muscle as needed for anaphylaxis. , Disp: , Rfl:  .  ezetimibe (ZETIA) 10 MG tablet, Take 1 tablet (10 mg total) by mouth daily., Disp: 90 tablet, Rfl: 3 .  fluticasone (FLONASE) 50 MCG/ACT nasal spray, Place 2 sprays into both nostrils daily., Disp: 16 g, Rfl: 3 .  Fluticasone-Salmeterol (WIXELA INHUB) 100-50 MCG/DOSE AEPB, Inhale 1 puff into the lungs 2 (two) times daily., Disp: , Rfl:  .  gabapentin (NEURONTIN) 600 MG tablet, 600 mg in the morning, 600 mg in the afternoon, 1200 mg nightly., Disp: 120 tablet, Rfl: 2 .  insulin glargine, 2 Unit Dial, (TOUJEO MAX) 300 UNIT/ML Solostar Pen, Inject 90 Units into the skin every morning., Disp: 30 mL, Rfl: 1 .  levETIRAcetam (KEPPRA) 500 MG tablet, Take 500 mg by mouth 2 (two) times daily. 1 TAB IN THE MORNING AND .5 TABS AT NIGHT, Disp: , Rfl:  .  losartan (COZAAR) 100 MG tablet, Take 1 tablet (100 mg total) by mouth daily., Disp: 90 tablet, Rfl: 3 .  mirabegron ER (MYRBETRIQ) 50 MG TB24 tablet, Take 1 tablet (50 mg total) by mouth daily., Disp: 90 tablet, Rfl: 1 .  ondansetron (ZOFRAN ODT) 4 MG disintegrating tablet, Take 1 tablet (4 mg total) by mouth every 8 (eight) hours as needed for nausea or vomiting., Disp: 20 tablet, Rfl: 1 .  pantoprazole (PROTONIX) 40 MG tablet, Take 1 tablet (40  mg total) by mouth daily., Disp: 90 tablet, Rfl: 0 .  polyethylene glycol (MIRALAX / GLYCOLAX) 17 g packet, Take 17 g by mouth daily as needed for mild constipation., Disp: 100 each, Rfl: 3 .  potassium chloride SA (KLOR-CON M20) 20 MEQ tablet, You have not needed potassium while in the hospital.  Hold until outpatient followup., Disp: 60 tablet, Rfl: 5 .  simvastatin (ZOCOR) 40 MG tablet, Take 40 mg by mouth daily., Disp: , Rfl:  .  spironolactone (ALDACTONE) 25 MG tablet, Take 1 tablet by mouth daily., Disp: , Rfl:  .  terconazole (TERAZOL 7) 0.4 % vaginal cream, Place 1 applicator vaginally at bedtime as needed (irritation)., Disp: , Rfl:  .  carvedilol (COREG) 12.5 MG tablet, TAKE 1 TABLET (12.5 MG TOTAL) BY MOUTH 2 (TWO) TIMES DAILY AFTER A MEAL., Disp: 180 tablet, Rfl: 1 .  DUPIXENT 300 MG/2ML SOPN,  Inject 300 mg into the skin every 14 (fourteen) days.  (Patient not taking: Reported on 12/12/2019), Disp: , Rfl:    Allergies  Allergen Reactions  . Codeine Swelling and Other (See Comments)    Swelling and burning of mouth (inside)  . Lactose Intolerance (Gi) Nausea And Vomiting     Social History   Tobacco Use  . Smoking status: Former Smoker    Packs/day: 1.00    Years: 15.00    Pack years: 15.00    Types: Cigarettes    Quit date: 01/20/1987    Years since quitting: 32.9  . Smokeless tobacco: Never Used  . Tobacco comment: quit more than 20 years  Vaping Use  . Vaping Use: Never used  Substance Use Topics  . Alcohol use: Never    Alcohol/week: 0.0 standard drinks  . Drug use: No    Comment: hx of marijuana use no longer uses       Chart Review Today: I personally reviewed active problem list, medication list, allergies, family history, social history, health maintenance, notes from last encounter, lab results, imaging with the patient/caregiver today.   Review of Systems 10 Systems reviewed and are negative for acute change except as noted in the HPI.     Objective:    Vitals:   12/12/19 1026  BP: 136/84  Pulse: 95  Resp: 16  Temp: 98.1 F (36.7 C)  TempSrc: Oral  SpO2: 95%  Weight: 251 lb 8 oz (114.1 kg)  Height: 5' 5"  (1.651 m)    Body mass index is 41.85 kg/m.  Physical Exam Vitals and nursing note reviewed.  Constitutional:      General: She is not in acute distress.    Appearance: She is obese. She is not ill-appearing, toxic-appearing or diaphoretic.  HENT:     Head: Normocephalic and atraumatic.  Eyes:     Conjunctiva/sclera: Conjunctivae normal.  Cardiovascular:     Rate and Rhythm: Normal rate and regular rhythm.  Pulmonary:     Effort: Pulmonary effort is normal. No respiratory distress.  Musculoskeletal:     Right lower leg: No edema.     Left lower leg: No edema.  Feet:     Right foot:     Skin integrity: No ulcer, blister, skin breakdown, erythema, callus or dry skin.  Skin:    General: Skin is warm and dry.     Capillary Refill: Capillary refill takes 2 to 3 seconds.     Coloration: Skin is not pale.  Neurological:     Mental Status: She is alert. Mental status is at baseline.     Gait: Gait abnormal (antalgic).     Comments: Walks with cane  Psychiatric:        Mood and Affect: Mood normal.        Behavior: Behavior normal.           Assessment & Plan:     ICD-10-CM   1. PAD (peripheral artery disease) ([place])  I73.9 US ARTERIAL ABI (SCREENING LOWER EXTREMITY)   home nurse testing reported critical ABI to right leg on 11/16, 1.9, pt presents 7 d later for f/up OV In office today the appearance of bilateral lower extremities and her feet are symmetrical, warm to the touch, symmetrical capillary refill about 1 to 3 seconds bilaterally, no wounds or ulcerations, no mottling, rubor, cyanosis, pallor. She denies any gradual onset of atrophy, thinning of hair or skin  She likely has peripheral artery disease because she has  atherosclerotic disease elsewhere, my plan is to repeat ABIs today and get f/up pending  results  We have tried for the last 30 minutes to arrange a stat ABI today to help her with transportation arrangements -Bonnita Nasuti was able to make an appointment for this afternoon however transportation that is currently here and will take her home cannot bring her back and her daughter reports she is too busy to bring her back today or tomorrow. She will need to do outpatient imaging based on her transportation limitations, which is scheduling 3 d ahead of appt   2. Pain of lower extremity, unspecified laterality  M79.606 US ARTERIAL ABI (SCREENING LOWER EXTREMITY)   may be neurogenic, plan is to repeat ABI ASAP, currently I do not have physical exam findings concerning for acute ischemic limb    Pt is already on plavix and statin  Reviewed concerning signs and sx that she should call 911 for emergent eval - pt verbalized understanding  Her ABI is now scheduled for Monday Will likely get her to vascular if ABI is positive   Delsa Grana, [name] 12/12/19 10:35 AM

## 2019-12-18 ENCOUNTER — Encounter: Payer: Self-pay | Admitting: Family Medicine

## 2019-12-18 ENCOUNTER — Other Ambulatory Visit: Payer: Self-pay

## 2019-12-18 ENCOUNTER — Ambulatory Visit
Admission: RE | Admit: 2019-12-18 | Discharge: 2019-12-18 | Disposition: A | Discharge status: Home / Self Care | Payer: Medicare Other | Source: Ambulatory Visit | Attending: Family Medicine | Admitting: Family Medicine

## 2019-12-18 DIAGNOSIS — R6889 Other general symptoms and signs: Secondary | ICD-10-CM | POA: Insufficient documentation

## 2019-12-18 DIAGNOSIS — I739 Peripheral vascular disease, unspecified: Secondary | ICD-10-CM | POA: Insufficient documentation

## 2019-12-18 DIAGNOSIS — M79606 Pain in leg, unspecified: Secondary | ICD-10-CM | POA: Diagnosis present

## 2019-12-19 ENCOUNTER — Ambulatory Visit: Payer: Medicare Other | Admitting: Endocrinology

## 2019-12-19 ENCOUNTER — Ambulatory Visit: Payer: Medicare Other | Admitting: Internal Medicine

## 2019-12-19 NOTE — Addendum Note (Signed)
Addended by: Delsa Grana on: 12/19/2019 12:57 PM   Modules accepted: Orders

## 2019-12-20 ENCOUNTER — Ambulatory Visit: Payer: Medicare Other | Admitting: Endocrinology

## 2020-01-01 ENCOUNTER — Encounter: Payer: Medicare Other | Admitting: Nutrition

## 2020-01-02 ENCOUNTER — Ambulatory Visit: Payer: Medicare Other | Admitting: Endocrinology

## 2020-01-03 ENCOUNTER — Ambulatory Visit: Payer: Medicare Other | Admitting: Endocrinology

## 2020-01-04 ENCOUNTER — Ambulatory Visit: Payer: Medicare Other | Admitting: Endocrinology

## 2020-01-11 DIAGNOSIS — M79604 Pain in right leg: Secondary | ICD-10-CM | POA: Insufficient documentation

## 2020-01-11 DIAGNOSIS — R200 Anesthesia of skin: Secondary | ICD-10-CM | POA: Insufficient documentation

## 2020-01-11 DIAGNOSIS — R202 Paresthesia of skin: Secondary | ICD-10-CM | POA: Insufficient documentation

## 2020-01-22 ENCOUNTER — Other Ambulatory Visit (INDEPENDENT_AMBULATORY_CARE_PROVIDER_SITE_OTHER): Payer: Self-pay | Admitting: Vascular Surgery

## 2020-01-22 DIAGNOSIS — I739 Peripheral vascular disease, unspecified: Secondary | ICD-10-CM

## 2020-01-24 DIAGNOSIS — M79606 Pain in leg, unspecified: Secondary | ICD-10-CM | POA: Insufficient documentation

## 2020-01-24 DIAGNOSIS — I739 Peripheral vascular disease, unspecified: Secondary | ICD-10-CM | POA: Insufficient documentation

## 2020-01-24 NOTE — Progress Notes (Signed)
MRN : [id number]  [name] is a 55 y.o. (August 08, 1965) female who presents with chief complaint of No chief complaint on file. [name]  History of Present Illness:   The patient is seen for evaluation of painful lower extremities the right leg more affected than the left substantially so. Patient notes the pain is variable and not always associated with activity.  The pain is somewhat consistent day to day occurring on most days. The patient notes the pain also occurs with standing and routinely seems worse as the day wears on. The pain has been progressive over the past several years. The patient states these symptoms are causing  a profound negative impact on quality of life and daily activities. She was recently seen by a visiting nurse who performed a vascular test, reportedly this demonstrated severe atherosclerotic occlusive disease.  There is a multiyear history of back problems and DJD of the lumbar and sacral spine associated with sciatic symptoms and chronic leg pain.   The patient denies rest pain or dangling of an extremity off the side of the bed during the night for relief. No open wounds or sores at this time. No history of DVT or phlebitis. No prior interventions or surgeries.    No outpatient medications have been marked as taking for the 01/25/20 encounter (Appointment) with Delana Meyer, Dolores Lory, MD.    Past Medical History:  Diagnosis Date  . Anxiety and depression 09/13/2006   Qualifier: Diagnosis of  By: Hassell Done [name], Tori Milks    . Arthritis    joint pain   . Asthma   . COPD (chronic obstructive pulmonary disease) (HCC)    chronic bronchitis   . Depression   . Diabetes mellitus    since age 33; type 2 IDDM  . Diabetic neuropathy, painful ([place])    FEET AND HANDS  . Diabetic retinopathy ([place])   . Diastolic dysfunction    a.  Echo 11/17: EF 60-65%, mild LVH, no RWMA, Gr1DD, mild AI, dilated aortic root measuring 38 mm, mildly dilated ascending aorta  . Dilated  aortic root ([place])    34m by echo 11/2015  . Dizziness    secondary to diabetes and hypertension   . Epilepsy idiopathic [place]    last seizure 2012;controlled w/ topomax  . GERD (gastroesophageal reflux disease)   . Glaucoma    NOT ON ANY EYE DROPS   . Headache(784.0)    migraines   . Heart murmur    born with   . History of stress test    a. 11/17: Normal perfusion, EF 53%, normal study  . Hx of blood clots    hematomas removed from left side of brain from [id number]   . Hyperlipidemia   . Hypertension   . Legally blind    left eye   . MIGRAINE HEADACHE 09/14/2006   Qualifier: Diagnosis of  By: MarHassell DoneP, NykTori Milks . Myocardial infarction ([place]  a.  Patient reported history of without objective documentation  . Nephrolithiasis    frequent urination , urination at nite  PT SEEN IN ER 09/22/11 FOR BACK AND RT SIDED PAIN--HAS KNOWN STONE RT URETER AND UA IN ER SHOWED UTI  . Pain 09/23/11   LOWER BACK AND RIGHT SIDE--PT HAS RIGHT URETERAL STONE  . Pneumonia    hx of 2009  . Pyelonephritis   . Seizures ([place] . Sleep apnea    [facility] ; mild  . Stress incontinence   . Stroke [facility])    last 2003  RESIDUAL LEFT LEG WEAKNESS--NO OTHER RESIDUAL PROBLEMS    Past Surgical History:  Procedure Laterality Date  . BRAIN HEMATOMA EVACUATION     five procedures total, first procedure when 25 months old, last at 55 years of age.  . CYSTOSCOPY W/ RETROGRADES  09/24/2011   Procedure: CYSTOSCOPY WITH RETROGRADE PYELOGRAM;  Surgeon: Malka So, MD;  Location: [facility];  Service: Urology;  Laterality: Right;  . CYSTOSCOPY WITH URETEROSCOPY  09/24/2011   Procedure: CYSTOSCOPY WITH URETEROSCOPY;  Surgeon: Malka So, MD;  Location: [facility];  Service: Urology;  Laterality: Right;  Balloon dilation right ureter   . CYSTOSCOPY/RETROGRADE/URETEROSCOPY  08/24/2011   Procedure: CYSTOSCOPY/RETROGRADE/URETEROSCOPY;  Surgeon: Molli Hazard,  MD;  Location: [facility];  Service: Urology;  Laterality: Right;  Cysto, Right retrograde Pyelogram, right stent placement.   Fatima Blank HERNIA REPAIR  05/05/2011   Procedure: LAPAROSCOPIC INCISIONAL HERNIA;  Surgeon: Rolm Bookbinder, MD;  Location: [facility];  Service: General;  Laterality: N/A;  . kidney stone removal    . MASS EXCISION  05/05/2011   Procedure: EXCISION MASS;  Surgeon: Rolm Bookbinder, MD;  Location: [place];  Service: General;  Laterality: Right;  . PCNL    . RETINAL DETACHMENT SURGERY Left 1990  . RIGHT/LEFT HEART CATH AND CORONARY ANGIOGRAPHY N/A 04/28/2019   Procedure: RIGHT/LEFT HEART CATH AND CORONARY ANGIOGRAPHY;  Surgeon: Yolonda Kida, MD;  Location: [facility];  Service: Cardiovascular;  Laterality: N/A;  . SHUNT REMOVAL     shunt inserted at age 74 removed at age 53   . VAGINAL HYSTERECTOMY  1996    Social History Social History   Tobacco Use  . Smoking status: Former Smoker    Packs/day: 1.00    Years: 15.00    Pack years: 15.00    Types: Cigarettes    Quit date: 01/20/1987    Years since quitting: 33.0  . Smokeless tobacco: Never Used  . Tobacco comment: quit more than 20 years  Vaping Use  . Vaping Use: Never used  Substance Use Topics  . Alcohol use: Never    Alcohol/week: 0.0 standard drinks  . Drug use: No    Comment: hx of marijuana use no longer uses     Family History Family History  Problem Relation Age of Onset  . Uterine cancer Mother   . Hypertension Mother   . Cancer Mother   . Brain cancer Maternal Grandmother   . Hypertension Maternal Grandmother   . Birth defects Daughter   . Hypertension Daughter   . Cirrhosis Maternal Grandfather   . ADD / ADHD Maternal Grandfather   . Birth defects Maternal Grandfather   . Diabetes Maternal Grandfather   . Anesthesia problems Neg Hx   . Colon cancer Neg Hx   . Esophageal cancer Neg Hx   . Pancreatic cancer Neg Hx   No family history of bleeding/clotting disorders, porphyria  or autoimmune disease   Allergies  Allergen Reactions  . Codeine Swelling and Other (See Comments)    Swelling and burning of mouth (inside)  . Lactose Intolerance (Gi) Nausea And Vomiting     REVIEW OF SYSTEMS (Negative unless checked)  Constitutional: [] Weight loss  [] Fever  [] Chills Cardiac: [] Chest pain   [] Chest pressure   [] Palpitations   [] Shortness of breath when laying flat   [] Shortness of breath with exertion. Vascular:  [  x]Pain in legs with walking   [x] Pain in legs at rest  [] History of DVT   [] Phlebitis   [] Swelling in legs   [] Varicose veins   [] Non-healing ulcers Pulmonary:   [] Uses home oxygen   [] Productive cough   [] Hemoptysis   [] Wheeze  [x] COPD   [] Asthma Neurologic:  [] Dizziness   [] Seizures   [] History of stroke   [] History of TIA  [] Aphasia   [] Vissual changes   [] Weakness or numbness in arm   [] Weakness or numbness in leg Musculoskeletal:   [] Joint swelling   [x] Joint pain   [x] Low back pain Hematologic:  [] Easy bruising  [] Easy bleeding   [] Hypercoagulable state   [] Anemic Gastrointestinal:  [] Diarrhea   [] Vomiting  [] Gastroesophageal reflux/heartburn   [] Difficulty swallowing. Genitourinary:  [] Chronic kidney disease   [] Difficult urination  [] Frequent urination   [] Blood in urine Skin:  [] Rashes   [] Ulcers  Psychological:  [] History of anxiety   []  History of major depression.  Physical Examination  There were no vitals filed for this visit. There is no height or weight on file to calculate BMI. Gen: WD/WN, NAD Head: [place]/AT, No temporalis wasting.  Ear/Nose/Throat: Hearing grossly intact, nares w/o erythema or drainage, poor dentition Eyes: PER, EOMI, sclera nonicteric.  Neck: Supple, no masses.  No bruit or JVD.  Pulmonary:  Good air movement, clear to auscultation bilaterally, no use of accessory muscles.  Cardiac: RRR, normal S1, S2, no Murmurs. Vascular:  Vessel Right Left  Radial Palpable Palpable  Carotid Palpable Palpable  PT Palpable Palpable   DP Palpable Palpable  Gastrointestinal: soft, non-distended. No guarding/no peritoneal signs.  Musculoskeletal: M/S 5/5 throughout.  No deformity or atrophy.  Neurologic: CN 2-12 intact. Pain and light touch intact in extremities.  Symmetrical.  Speech is fluent. Motor exam as listed above. Psychiatric: Judgment intact, Mood & affect appropriate for pt's clinical situation. Dermatologic: No rashes or ulcers noted.  No changes consistent with cellulitis.   CBC Lab Results  Component Value Date   WBC 6.2 05/30/2019   HGB 12.4 05/30/2019   HCT 38.1 05/30/2019   MCV 91.6 05/30/2019   PLT 283 05/30/2019    BMET    Component Value Date/Time   NA 140 09/21/2019 1038   NA 138 05/08/2015 1106   K 3.6 09/21/2019 1038   CL 103 09/21/2019 1038   CO2 28 09/21/2019 1038   GLUCOSE 91 09/21/2019 1038   BUN 11 09/21/2019 1038   BUN 14 05/08/2015 1106   CREATININE 0.82 09/21/2019 1038   CREATININE 0.82 11/28/2018 0000   CALCIUM 9.7 09/21/2019 1038   GFRNONAA >60 05/30/2019 1543   GFRNONAA 81 11/28/2018 0000   GFRAA >60 05/30/2019 1543   GFRAA 94 11/28/2018 0000   CrCl cannot be calculated (Patient's most recent lab result is older than the maximum 21 days allowed.).  COAG Lab Results  Component Value Date   INR 1.0 04/27/2019   INR 1.14 02/23/2014    Radiology No results found.   Assessment/Plan 1. Pain of lower extremity, unspecified laterality Recommend:  I do not find evidence of life style limiting vascular disease. The patient specifically denies life style limitation.  Previous noninvasive studies including ABI's of the legs do not identify critical vascular problems.  The patient should continue walking and begin a more formal exercise program. The patient should continue his antiplatelet therapy and aggressive treatment of the lipid abnormalities.  The patient should begin wearing graduated compression socks 15-20 mmHg strength to control her mild  edema.  Patient will follow-up with me on a PRN basis   2. PAD (peripheral artery disease) (HCC) Recommend:  I do not find evidence of life style limiting vascular disease. The patient specifically denies life style limitation.  Previous noninvasive studies including ABI's of the legs do not identify critical vascular problems.  The patient should continue walking and begin a more formal exercise program. The patient should continue his antiplatelet therapy and aggressive treatment of the lipid abnormalities.  The patient should begin wearing graduated compression socks 15-20 mmHg strength to control her mild edema.  Patient will follow-up with me on a PRN basis   3. Mixed hyperlipidemia Continue statin as ordered and reviewed, no changes at this time   4. Type 2 diabetes mellitus without complication, with long-term current use of insulin (HCC) Continue hypoglycemic medications as already ordered, these medications have been reviewed and there are no changes at this time.  Hgb A1C to be monitored as already arranged by primary service   5. Coronary artery disease involving native heart with angina pectoris, unspecified vessel or lesion type [facility]) Continue cardiac and antihypertensive medications as already ordered and reviewed, no changes at this time.  Continue statin as ordered and reviewed, no changes at this time  Nitrates PRN for chest pain    Hortencia Pilar, MD  01/24/2020 4:48 PM

## 2020-01-25 ENCOUNTER — Other Ambulatory Visit: Payer: Self-pay

## 2020-01-25 ENCOUNTER — Other Ambulatory Visit (INDEPENDENT_AMBULATORY_CARE_PROVIDER_SITE_OTHER): Payer: Self-pay | Admitting: Vascular Surgery

## 2020-01-25 ENCOUNTER — Ambulatory Visit (INDEPENDENT_AMBULATORY_CARE_PROVIDER_SITE_OTHER): Payer: Medicare Other

## 2020-01-25 ENCOUNTER — Ambulatory Visit (INDEPENDENT_AMBULATORY_CARE_PROVIDER_SITE_OTHER): Payer: Medicare Other | Admitting: Vascular Surgery

## 2020-01-25 ENCOUNTER — Encounter (INDEPENDENT_AMBULATORY_CARE_PROVIDER_SITE_OTHER): Payer: Self-pay | Admitting: Vascular Surgery

## 2020-01-25 VITALS — BP 163/100 | HR 83 | Ht 65.0 in | Wt 240.0 lb

## 2020-01-25 DIAGNOSIS — E782 Mixed hyperlipidemia: Secondary | ICD-10-CM

## 2020-01-25 DIAGNOSIS — Z794 Long term (current) use of insulin: Secondary | ICD-10-CM

## 2020-01-25 DIAGNOSIS — I739 Peripheral vascular disease, unspecified: Secondary | ICD-10-CM | POA: Diagnosis not present

## 2020-01-25 DIAGNOSIS — I25119 Atherosclerotic heart disease of native coronary artery with unspecified angina pectoris: Secondary | ICD-10-CM

## 2020-01-25 DIAGNOSIS — M79606 Pain in leg, unspecified: Secondary | ICD-10-CM

## 2020-01-25 DIAGNOSIS — E119 Type 2 diabetes mellitus without complications: Secondary | ICD-10-CM

## 2020-01-29 ENCOUNTER — Other Ambulatory Visit: Payer: Self-pay

## 2020-01-29 ENCOUNTER — Other Ambulatory Visit: Payer: Self-pay | Admitting: Endocrinology

## 2020-01-29 ENCOUNTER — Encounter: Payer: Medicare Other | Attending: Endocrinology | Admitting: Nutrition

## 2020-01-29 DIAGNOSIS — E1165 Type 2 diabetes mellitus with hyperglycemia: Secondary | ICD-10-CM

## 2020-01-29 DIAGNOSIS — E1129 Type 2 diabetes mellitus with other diabetic kidney complication: Secondary | ICD-10-CM | POA: Insufficient documentation

## 2020-01-30 ENCOUNTER — Encounter: Payer: Medicare Other | Admitting: Nutrition

## 2020-01-30 ENCOUNTER — Encounter: Payer: Self-pay | Admitting: Endocrinology

## 2020-01-30 ENCOUNTER — Ambulatory Visit (INDEPENDENT_AMBULATORY_CARE_PROVIDER_SITE_OTHER): Payer: Medicare Other | Admitting: Endocrinology

## 2020-01-30 VITALS — BP 140/90 | HR 75 | Ht 65.0 in | Wt 254.2 lb

## 2020-01-30 DIAGNOSIS — Z794 Long term (current) use of insulin: Secondary | ICD-10-CM | POA: Diagnosis not present

## 2020-01-30 DIAGNOSIS — E1165 Type 2 diabetes mellitus with hyperglycemia: Secondary | ICD-10-CM | POA: Diagnosis not present

## 2020-01-30 DIAGNOSIS — IMO0002 Reserved for concepts with insufficient information to code with codable children: Secondary | ICD-10-CM

## 2020-01-30 DIAGNOSIS — E1129 Type 2 diabetes mellitus with other diabetic kidney complication: Secondary | ICD-10-CM

## 2020-01-30 LAB — COMPREHENSIVE METABOLIC PANEL
ALT: 16 U/L (ref 0–35)
AST: 15 U/L (ref 0–37)
Albumin: 4 g/dL (ref 3.5–5.2)
Alkaline Phosphatase: 71 U/L (ref 39–117)
BUN: 14 mg/dL (ref 6–23)
CO2: 32 mEq/L (ref 19–32)
Calcium: 9.4 mg/dL (ref 8.4–10.5)
Chloride: 104 mEq/L (ref 96–112)
Creatinine, Ser: 0.78 mg/dL (ref 0.40–1.20)
GFR: 85.55 mL/min (ref >60.00)
Glucose, Bld: 91 mg/dL (ref 70–99)
Potassium: 3.8 mEq/L (ref 3.5–5.1)
Sodium: 138 mEq/L (ref 135–145)
Total Bilirubin: 0.4 mg/dL (ref 0.2–1.2)
Total Protein: 7.4 g/dL (ref 6.0–8.3)

## 2020-01-30 LAB — HEMOGLOBIN A1C: Hgb A1c MFr Bld: 9.1 % — ABNORMAL HIGH (ref 4.6–6.5)

## 2020-01-30 NOTE — Progress Notes (Signed)
Patient was trained on the OmniPod insulin pump. We discussed how pump therapy works and settings were put in by the patient:  Basal rate: 2.0u/hr, I/C 1 (pt. Will take 15u for smaller meals, and 20 for larger meals.  She will put in the insulin amount under the carb portion of the bolus calculator.  She re demonstrated this correctly X4. She filled a pod with Humalog insulin, and attached it to her upper right abdomen without difficulty.  She had taken 80u of  Levemir last night at 9PM.  She has not taken any Novolog for the last 6 months, saying she went into the hospital with a low blood sugar and they reduced her insulin dose by 1/2 and told to stop the Novolog.  The pump was put in a basal reduction mode of 100% for 8 hours.  She was shown how to respond to the alert when it happens at 9PM tonight.  She reported good understanding of this. She is wanting a [employer] and the orders were signed by Dr. Dwyane Dee and faxed to [facility].  She downloaded the app to her phone to be able to view the readings when she gets the sensors.   She was given a resource guide to review tonight and, I will see her tomorrow to finish training. She had no final questions for me.

## 2020-01-30 NOTE — Patient Instructions (Signed)
Read over all of the resource guide tonight Call OmniPod help line if questions.

## 2020-01-30 NOTE — Progress Notes (Signed)
Patient ID: [name], female   DOB: 12/17/1965, 55 y.o.   MRN: [id number]          Reason for Appointment: Follow-up for Type 2 Diabetes   History of Present Illness:          Date of diagnosis of type 2 diabetes mellitus:   1989      Background history:   She thinks she was initially treated with diet alone and subsequently given metformin With metformin she had nausea and diarrhea but she does not know if she was treated with any other medications until she was started on insulin in 2003.  Also was on glipizide in 2015 Most likely she was on Levemir or Lantus insulin for quite some time She was then switched to Toujeo in 2016 and although she was initially prescribed 90 units twice a day this was subsequently lowered  Her A1c has been occasionally between 7.2 and 7.5 but has been consistently over 9% since 2018  Recent history:   INSULIN regimen previously: 80 U Toujeo hs, Novolog 20-25 units before meals based on blood sugar      OMNIPOD PUMP settings: Basal rate 2 units/h Carbohydrate coverage 1:1 ratio, correction 1: 40 and target 120-140  Non-insulin hypoglycemic drugs the patient is taking are: Trulicity 1.5 mg weekly, Farxiga 5 mg daily  Her A1c is most recently 9.4% as of 8/21, previous range 8.9-12.6  Current management, blood sugar patterns and problems identified:  She did not come back for follow-up when she was started on Farxiga on her last visit  She was told to leave off Trulicity at that time because of lack of benefit  However she is still taking this.  She started the OMNIPOD pump yesterday although her basal rate was put on hold till 9 PM because of her taking Toujeo the night before  She is using the Accu-Chek to check her sugars blood sugars at home earlier this month were significantly high at least on 1/5 through 1/7 but near normal and down to 74 before starting the pump yesterday  Blood sugar history since yesterday:  Blood sugar at  dinnertime 168, after dinner 186; blood sugar this morning fasting 86 and after breakfast 101  Although she was supposed to put in 15 units based on 15 carbs at dinnertime yesterday she was not sure if she delivered insulin and apparently delivered a total of about 35 units about 1 and 1-1/2 hours after dinner  Did not check blood sugar during the night   she did have Glucerna this morning and took 5 units bolus for this       Side effects from medications have been:metformin caused GI side effects  Glucose monitoring:  done <1 times a day         Glucometer: One Touch Verio.       Blood Glucose readings by time of day and averages from meter download as above Previous readings:   PRE-MEAL Fasting Lunch Dinner  overnight Overall  Glucose range:  165-269   180  150, 345   Mean/median:  212     250   POST-MEAL PC Breakfast PC Lunch  at bedtime  Glucose range:  281-302  271, 355  314-318  Mean/median:    316    Meal times are: 6 7 7  AM, 1-2 PM and 5-6 PM                Dietician visit, most recent: 9/18 CDE visit:  01/2020 for pump start  Weight history: Previous range 219-230  Wt Readings from Last 3 Encounters:  01/30/20 254 lb 3.2 oz (115.3 kg)  01/25/20 240 lb (108.9 kg)  12/12/19 251 lb 8 oz (114.1 kg)    Glycemic control:    Lab Results  Component Value Date   HGBA1C 9.4 (A) 09/14/2019   HGBA1C 8.9 (H) 04/28/2019   HGBA1C 12.6 (H) 01/24/2018   Lab Results  Component Value Date   MICROALBUR 20.3 (H) 09/21/2019   LDLCALC 92 04/28/2019   CREATININE 0.82 09/21/2019   Lab Results  Component Value Date   MICRALBCREAT 21.8 09/21/2019    Lab Results  Component Value Date   FRUCTOSAMINE CANCELED 07/26/2015   FRUCTOSAMINE 264 07/26/2015      Allergies as of 01/30/2020      Reactions   Codeine Swelling, Other (See Comments)   Swelling and burning of mouth (inside)   Lactose Intolerance (gi) Nausea And Vomiting      Medication List       Accurate as of  January 30, 2020 12:00 PM. If you have any questions, ask your nurse or doctor.        Accu-Chek Softclix Lancets lancets   albuterol 108 (90 Base) MCG/ACT inhaler Commonly known as: ProAir HFA INHALE 2 PUFFS BY MOUTH EVERY 6 HOURS AS NEEDED FOR WHEEZING OR SHORTNESS OF BREATH   albuterol 1.25 MG/3ML nebulizer solution Commonly known as: ACCUNEB Inhale into the lungs. Use 3 mls by nebulization every 6 hours as needed for wheezing   baclofen 10 MG tablet Commonly known as: LIORESAL Take 1 tablet (10 mg total) by mouth 3 (three) times daily.   budesonide 0.5 MG/2ML nebulizer solution Commonly known as: PULMICORT Inhale 0.5 mg into the lungs daily. Take 2 mls by nebulizations once daily   CALCIUM-VITAMIN D3 PO D3 500- calcium 630 mg Take 1 capsule by mouth once daily   carvedilol 12.5 MG tablet Commonly known as: COREG TAKE 1 TABLET (12.5 MG TOTAL) BY MOUTH 2 (TWO) TIMES DAILY AFTER A MEAL.   clopidogrel 75 MG tablet Commonly known as: PLAVIX Take 1 tablet (75 mg total) by mouth daily.   cycloSPORINE 0.05 % ophthalmic emulsion Commonly known as: RESTASIS Place 2 drops into both eyes 2 (two) times daily.   dapagliflozin propanediol 5 MG Tabs tablet Commonly known as: Farxiga Take 1 tablet (5 mg total) by mouth daily.   DULoxetine 20 MG capsule Commonly known as: Cymbalta Take 2 capsules q am and 1 capsule q pm   Dupixent 300 MG/2ML Sopn Generic drug: Dupilumab Inject 300 mg into the skin every 14 (fourteen) days.   EPINEPHrine 0.3 mg/0.3 mL Soaj injection Commonly known as: EPI-PEN Inject 0.3 mg into the muscle as needed for anaphylaxis.   ezetimibe 10 MG tablet Commonly known as: Zetia Take 1 tablet (10 mg total) by mouth daily.   fluticasone 50 MCG/ACT nasal spray Commonly known as: FLONASE Place 2 sprays into both nostrils daily.   Fluticasone-Salmeterol 100-50 MCG/DOSE Aepb Commonly known as: ADVAIR Inhale 1 puff into the lungs 2 (two) times daily.    gabapentin 600 MG tablet Commonly known as: Neurontin 600 mg in the morning, 600 mg in the afternoon, 1200 mg nightly.   insulin glargine (2 Unit Dial) 300 UNIT/ML Solostar Pen Commonly known as: TOUJEO MAX Inject 90 Units into the skin every morning.   levETIRAcetam 500 MG tablet Commonly known as: KEPPRA Take 500 mg by mouth 2 (two) times daily. 1  TAB IN THE MORNING AND .5 TABS AT NIGHT   losartan 100 MG tablet Commonly known as: COZAAR Take 1 tablet (100 mg total) by mouth daily.   mirabegron ER 50 MG Tb24 tablet Commonly known as: Myrbetriq Take 1 tablet (50 mg total) by mouth daily.   nitroGLYCERIN 0.4 MG SL tablet Commonly known as: NITROSTAT   OmniPod Dash System Kit by Does not apply route.   ondansetron 4 MG disintegrating tablet Commonly known as: Zofran ODT Take 1 tablet (4 mg total) by mouth every 8 (eight) hours as needed for nausea or vomiting.   pantoprazole 40 MG tablet Commonly known as: PROTONIX Take 1 tablet (40 mg total) by mouth daily.   polyethylene glycol 17 g packet Commonly known as: MIRALAX / GLYCOLAX Take 17 g by mouth daily as needed for mild constipation.   potassium chloride SA 20 MEQ tablet Commonly known as: Klor-Con M20 You have not needed potassium while in the hospital.  Hold until outpatient followup.   Precision QID Test test strip Generic drug: glucose blood Use 1 each (1 strip total) 4 (four) times daily Use as instructed.   simvastatin 40 MG tablet Commonly known as: ZOCOR Take 40 mg by mouth daily.   spironolactone 25 MG tablet Commonly known as: ALDACTONE Take 1 tablet by mouth daily.   terconazole 0.4 % vaginal cream Commonly known as: TERAZOL 7 Place 1 applicator vaginally at bedtime as needed (irritation).   Trulicity 1.5 IR/4.4RX Sopn Generic drug: Dulaglutide Inject 1.5 mg into the skin once a week.       Allergies:  Allergies  Allergen Reactions  . Codeine Swelling and Other (See Comments)     Swelling and burning of mouth (inside)  . Lactose Intolerance (Gi) Nausea And Vomiting    Past Medical History:  Diagnosis Date  . Anxiety and depression 09/13/2006   Qualifier: Diagnosis of  By: Hassell Done [name], Tori Milks    . Arthritis    joint pain   . Asthma   . COPD (chronic obstructive pulmonary disease) (HCC)    chronic bronchitis   . Depression   . Diabetes mellitus    since age 66; type 2 IDDM  . Diabetic neuropathy, painful ([place])    FEET AND HANDS  . Diabetic retinopathy ([place])   . Diastolic dysfunction    a.  Echo 11/17: EF 60-65%, mild LVH, no RWMA, Gr1DD, mild AI, dilated aortic root measuring 38 mm, mildly dilated ascending aorta  . Dilated aortic root ([place])    54m by echo 11/2015  . Dizziness    secondary to diabetes and hypertension   . Epilepsy idiopathic petit mal ([facility]    last seizure 2012;controlled w/ topomax  . GERD (gastroesophageal reflux disease)   . Glaucoma    NOT ON ANY EYE DROPS   . Headache(784.0)    migraines   . Heart murmur    born with   . History of stress test    a. 11/17: Normal perfusion, EF 53%, normal study  . Hx of blood clots    hematomas removed from left side of brain from [id number]   . Hyperlipidemia   . Hypertension   . Legally blind    left eye   . MIGRAINE HEADACHE 09/14/2006   Qualifier: Diagnosis of  By: MarHassell DoneP, NykTori Milks . [facility] infarction ([facility]  a.  Patient reported history of without objective documentation  . Nephrolithiasis    frequent urination ,  urination at nite  PT SEEN IN ER 09/22/11 FOR BACK AND RT SIDED PAIN--HAS KNOWN STONE RT URETER AND UA IN ER SHOWED UTI  . Pain 09/23/11   LOWER BACK AND RIGHT SIDE--PT HAS RIGHT URETERAL STONE  . Pneumonia    hx of 2009  . Pyelonephritis   . Seizures ([place])   . Sleep apnea    [facility] not [facility] ; mild  . Stress incontinence   . Stroke [facility])    last 2003  RESIDUAL LEFT LEG WEAKNESS--NO OTHER RESIDUAL PROBLEMS    Past  Surgical History:  Procedure Laterality Date  . BRAIN HEMATOMA EVACUATION     five procedures total, first procedure when 56 months old, last at 55 years of age.  . CYSTOSCOPY W/ RETROGRADES  09/24/2011   Procedure: CYSTOSCOPY WITH RETROGRADE PYELOGRAM;  Surgeon: Malka So, MD;  Location: [facility];  Service: Urology;  Laterality: Right;  . CYSTOSCOPY WITH URETEROSCOPY  09/24/2011   Procedure: CYSTOSCOPY WITH URETEROSCOPY;  Surgeon: Malka So, MD;  Location: [facility];  Service: Urology;  Laterality: Right;  Balloon dilation right ureter   . CYSTOSCOPY/RETROGRADE/URETEROSCOPY  08/24/2011   Procedure: CYSTOSCOPY/RETROGRADE/URETEROSCOPY;  Surgeon: Molli Hazard, MD;  Location: [facility];  Service: Urology;  Laterality: Right;  Cysto, Right retrograde Pyelogram, right stent placement.   Fatima Blank HERNIA REPAIR  05/05/2011   Procedure: LAPAROSCOPIC INCISIONAL HERNIA;  Surgeon: Rolm Bookbinder, MD;  Location: [place];  Service: General;  Laterality: N/A;  . kidney stone removal    . MASS EXCISION  05/05/2011   Procedure: EXCISION MASS;  Surgeon: Rolm Bookbinder, MD;  Location: [place];  Service: General;  Laterality: Right;  . PCNL    . RETINAL DETACHMENT SURGERY Left 1990  . RIGHT/LEFT HEART CATH AND CORONARY ANGIOGRAPHY N/A 04/28/2019   Procedure: RIGHT/LEFT HEART CATH AND CORONARY ANGIOGRAPHY;  Surgeon: Yolonda Kida, MD;  Location: [place] [facility];  Service: Cardiovascular;  Laterality: N/A;  . SHUNT REMOVAL     shunt inserted at age 55 removed at age 23   . VAGINAL HYSTERECTOMY  1996    Family History  Problem Relation Age of Onset  . Uterine cancer Mother   . Hypertension Mother   . Cancer Mother   . Brain cancer Maternal Grandmother   . Hypertension Maternal Grandmother   . Birth defects Daughter   . Hypertension Daughter   . Cirrhosis Maternal Grandfather   . ADD / ADHD Maternal Grandfather   . Birth defects Maternal Grandfather   . Diabetes Maternal Grandfather   .  Anesthesia problems Neg Hx   . Colon cancer Neg Hx   . Esophageal cancer Neg Hx   . Pancreatic cancer Neg Hx     Social History:  reports that she quit smoking about 33 years ago. Her smoking use included cigarettes. She has a 15.00 pack-year smoking history. She has never used smokeless tobacco. She reports that she does not drink alcohol and does not use drugs.   Review of Systems   Lipid history: Is being treated with simvastatin 40 mg daily    Lab Results  Component Value Date   CHOL 155 04/28/2019   HDL 43 04/28/2019   LDLCALC 92 04/28/2019   TRIG 102 04/28/2019   CHOLHDL 3.6 04/28/2019           Hypertension: Taking Coreg, Losartan  BP Readings from Last 3 Encounters:  01/30/20 140/90  01/25/20 (!) 163/100  12/12/19 136/84  Hypokalemia: She has been given 40 mEq of potassium along with 25 mg spironolactone for treatment by PCP  Lab Results  Component Value Date   K 3.6 09/21/2019    Most recent eye exam was in 8/21  Most recent foot exam: 6/76  Diabetic complications: History of neuropathy, microalbuminuria and background retinopathy  She will occasionally take prednisone for her asthma, none for the last 2 months  LABS:  No visits with results within 1 Week(s) from this visit.  Latest known visit with results is:  Office Visit on 09/21/2019  Component Date Value Ref Range Status  . Color, Urine 09/21/2019 YELLOW  Yellow;Lt. Yellow;Straw;Dark Yellow;Amber;Green;Red;Brown Final  . APPearance 09/21/2019 Sl Cloudy* Clear;Turbid;Slightly Cloudy;Cloudy Final  . Specific Gravity, Urine 09/21/2019 1.020  1.000 - 1.030 Final  . pH 09/21/2019 6.0  5.0 - 8.0 Final  . Total Protein, Urine 09/21/2019 TRACE* Negative Final  . Urine Glucose 09/21/2019 NEGATIVE  Negative Final  . Ketones, ur 09/21/2019 NEGATIVE  Negative Final  . Bilirubin Urine 09/21/2019 NEGATIVE  Negative Final  . Hgb urine dipstick 09/21/2019 SMALL* Negative Final  . Urobilinogen, UA  09/21/2019 0.2  0.0 - 1.0 Final  . Leukocytes,Ua 09/21/2019 TRACE* Negative Final  . Nitrite 09/21/2019 POSITIVE* Negative Final  . WBC, UA 09/21/2019 11-20/hpf* 0-2/hpf Final  . RBC / HPF 09/21/2019 3-6/hpf* 0-2/hpf Final  . Squamous Epithelial / LPF 09/21/2019 Rare(0-4/hpf)  Rare(0-4/hpf) Final  . Bacteria, UA 09/21/2019 2+* None Final  . Microalb, Ur 09/21/2019 20.3* 0.0 - 1.9 mg/dL Final  . Creatinine,U 09/21/2019 93.1  mg/dL Final  . Microalb Creat Ratio 09/21/2019 21.8  0.0 - 30.0 mg/g Final  . Sodium 09/21/2019 140  135 - 145 mEq/L Final  . Potassium 09/21/2019 3.6  3.5 - 5.1 mEq/L Final  . Chloride 09/21/2019 103  96 - 112 mEq/L Final  . CO2 09/21/2019 28  19 - 32 mEq/L Final  . Glucose, Bld 09/21/2019 91  70 - 99 mg/dL Final  . BUN 09/21/2019 11  6 - 23 mg/dL Final  . Creatinine, Ser 09/21/2019 0.82  0.40 - 1.20 mg/dL Final  . Total Bilirubin 09/21/2019 0.3  0.2 - 1.2 mg/dL Final  . Alkaline Phosphatase 09/21/2019 91  39 - 117 U/L Final  . AST 09/21/2019 12  0 - 37 U/L Final  . ALT 09/21/2019 14  0 - 35 U/L Final  . Total Protein 09/21/2019 8.1  6.0 - 8.3 g/dL Final  . Albumin 09/21/2019 4.5  3.5 - 5.2 g/dL Final  . GFR 09/21/2019 72.38  >60.00 mL/min Final  . Calcium 09/21/2019 9.7  8.4 - 10.5 mg/dL Final  . Urine Culture, Routine 09/21/2019 Final report*  Final  . Organism ID, Bacteria 09/21/2019 Comment*  Final   Comment: Escherichia coli, identified by an automated biochemical system. Greater than 100,000 colony forming units per mL Cefazolin <=4 ug/mL Cefazolin with an MIC <=16 predicts susceptibility to the oral agents cefaclor, cefdinir, cefpodoxime, cefprozil, cefuroxime, cephalexin, and loracarbef when used for therapy of uncomplicated urinary tract infections due to E. coli, Klebsiella pneumoniae, and Proteus mirabilis. BIOTYPE I   . ORGANISM ID, BACTERIA 09/21/2019 Escherichia coli*  Final   Comment: Multi-Drug Resistant Organism Greater than 100,000 colony  forming units per mL Cefazolin with an MIC <=16 predicts susceptibility to the oral agents cefaclor, cefdinir, cefpodoxime, cefprozil, cefuroxime, cephalexin, and loracarbef when used for therapy of uncomplicated urinary tract infections due to E. coli, Klebsiella pneumoniae, and Proteus mirabilis. BIOTYPE II   .  Antimicrobial Susceptibility 09/21/2019 Comment   Final   Comment:       ** S = Susceptible; I = Intermediate; R = Resistant **                    P = Positive; N = Negative             MICS are expressed in micrograms per mL    Antibiotic                 RSLT#1    RSLT#2    RSLT#3    RSLT#4 Amoxicillin/Clavulanic Acid    S         S Ampicillin                     R         R Cefazolin                                S Cefepime                       S         S Ceftriaxone                    S         S Cefuroxime                     S         S Ciprofloxacin                  S         S Ertapenem                      S         S Gentamicin                     S         S Imipenem                       S         S Levofloxacin                   S         S Meropenem                      S         S Nitrofurantoin                 S         S Piperacillin/Tazobactam        S         S Tetracycline                   R         R Tobramycin                     S         S Trimethoprim/Sulfa             R         R     Physical Examination:  BP 140/90  Pulse 75   Ht 5' 5"  (1.651 m)   Wt 254 lb 3.2 oz (115.3 kg)   LMP  (LMP Unknown)   SpO2 96%   BMI 42.30 kg/m       ASSESSMENT:  Diabetes type 2, uncontrolled, insulin-dependent  See history of present illness for detailed discussion of current diabetes management, blood sugar patterns and problems identified     Her A1c is last 9.4  She is on insulin pump since yesterday Her insulin pump was downloaded and blood sugar history as well as bolus history reviewed from the pump directly also  Compared to poor  control when she was taking 80 units of Toujeo along with variable doses of NovoLog her blood sugar since yesterday have been excellent and 86 this morning However she bolused excessive amount of insulin after dinner by mistake; this did not cause any hypoglycemia Also she has been started on Farxiga since her last visit but not clear if this has helped her sugars as yet  Hypertension is poorly controlled, needs to be followed up with her PCP and cardiologist  PLAN:   She is going to continue her settings including basal rate of 2 units/h unchanged for now She was reminded to bolus only before eating and not in between unless blood sugars are significantly high Explained to her how to review her bolus history and information available from her PDM  She will adjust her bolus up or down 5 units based on what she is eating with average dose of 15 units for now To call if blood sugars start going up Check labs for renal function and continue Farxiga Check A1c today for baseline level For now we will continue Trulicity but not clear if this has helped and may consider stopping this  There are no Patient Instructions on file for this visit.    Elayne Snare 01/30/2020, 12:00 PM   Note: This office note was prepared with Dragon voice recognition system technology. Any transcriptional errors that result from this process are unintentional.

## 2020-01-31 ENCOUNTER — Telehealth: Payer: Self-pay | Admitting: Endocrinology

## 2020-01-31 ENCOUNTER — Other Ambulatory Visit: Payer: Self-pay | Admitting: Endocrinology

## 2020-01-31 ENCOUNTER — Telehealth: Payer: Self-pay | Admitting: Nutrition

## 2020-01-31 ENCOUNTER — Ambulatory Visit (INDEPENDENT_AMBULATORY_CARE_PROVIDER_SITE_OTHER): Payer: Medicare Other | Admitting: Endocrinology

## 2020-01-31 ENCOUNTER — Encounter: Payer: Medicare Other | Admitting: Nutrition

## 2020-01-31 ENCOUNTER — Encounter: Payer: Self-pay | Admitting: Endocrinology

## 2020-01-31 ENCOUNTER — Other Ambulatory Visit: Payer: Self-pay

## 2020-01-31 VITALS — BP 128/90 | HR 77 | Ht 65.0 in

## 2020-01-31 DIAGNOSIS — E1165 Type 2 diabetes mellitus with hyperglycemia: Secondary | ICD-10-CM

## 2020-01-31 DIAGNOSIS — I10 Essential (primary) hypertension: Secondary | ICD-10-CM

## 2020-01-31 DIAGNOSIS — Z794 Long term (current) use of insulin: Secondary | ICD-10-CM

## 2020-01-31 DIAGNOSIS — IMO0002 Reserved for concepts with insufficient information to code with codable children: Secondary | ICD-10-CM

## 2020-01-31 DIAGNOSIS — E1129 Type 2 diabetes mellitus with other diabetic kidney complication: Secondary | ICD-10-CM

## 2020-01-31 LAB — FRUCTOSAMINE: Fructosamine: 298 umol/L — ABNORMAL HIGH (ref 0–285)

## 2020-01-31 MED ORDER — DAPAGLIFLOZIN PROPANEDIOL 10 MG PO TABS: 10.0000 mg | ORAL_TABLET | Freq: Every day | ORAL | 2 refills | Status: DC

## 2020-01-31 MED ORDER — INSULIN LISPRO 100 UNIT/ML ~~LOC~~ SOLN: SUBCUTANEOUS | 1 refills | Status: DC

## 2020-01-31 NOTE — Progress Notes (Signed)
Patient ID: [name], female   DOB: 08/23/1965, 55 y.o.   MRN: [id number]          Reason for Appointment: Follow-up for Type 2 Diabetes   History of Present Illness:          Date of diagnosis of type 2 diabetes mellitus:   1989      Background history:   She thinks she was initially treated with diet alone and subsequently given metformin With metformin she had nausea and diarrhea but she does not know if she was treated with any other medications until she was started on insulin in 2003.  Also was on glipizide in 2015 Most likely she was on Levemir or Lantus insulin for quite some time She was then switched to Toujeo in 2016 and although she was initially prescribed 90 units twice a day this was subsequently lowered  Her A1c has been occasionally between 7.2 and 7.5 but has been consistently over 9% since 2018  Recent history:   INSULIN regimen previously: 80 U Toujeo hs, Novolog 20-25 units before meals based on blood sugar      OMNIPOD PUMP settings: Basal rate 2 units/h Carbohydrate coverage 1:1 ratio, correction 1: 40 and target 120-140  Non-insulin hypoglycemic drugs the patient is taking are: Trulicity 1.5 mg weekly, Farxiga 5 mg daily  Her A1c is 9.1 and previous range 8.9-12.6  Current management, blood sugar patterns and problems identified:  She is currently using an Accu-Chek meter for monitoring and all of her readings are not in the pump  She started the OMNIPOD pump 2 days ago  Her blood sugars are practically normal since her visit yesterday without any changes being made  She admits that when she was taking injectable insulin she would miss her doses especially NovoLog  Lowest reading was 75 this morning on waking up but she was asymptomatic and also did not wake up during the night for checking her sugar or any hypoglycemic symptoms  Highest blood sugar was 146 at dinnertime although prior to that she had a Glucerna drink and had taken a total  of about 19 units of insulin  After eating a small bowl of spaghetti with [place] meat she had a glucose of 92 using 15 units bolus  Again she appears to be sometimes not able to figure out when her bolus has gone through and did the bolus twice  Surprisingly her blood sugar did not go down; yesterday with Glucerna she had good readings even with only a 5-minute bolus  Today after eating cereal and having some juice her blood sugar was 127 after breakfast  Renal function checked yesterday normal with starting Farxiga       Side effects from medications have been:metformin caused GI side effects  Glucose monitoring:  done <1 times a day         Glucometer: One Touch Verio.       Blood Glucose readings by time of day and averages from meter download as above   Meal times are: 6 7 7  AM, 1-2 PM and 5-6 PM                Dietician visit, most recent: 9/18 CDE visit: 01/2020 for pump start  Weight history: Previous range 219-230  Wt Readings from Last 3 Encounters:  01/30/20 254 lb 3.2 oz (115.3 kg)  01/25/20 240 lb (108.9 kg)  12/12/19 251 lb 8 oz (114.1 kg)    Glycemic control:  Lab Results  Component Value Date   HGBA1C 9.1 (H) 01/30/2020   HGBA1C 9.4 (A) 09/14/2019   HGBA1C 8.9 (H) 04/28/2019   Lab Results  Component Value Date   MICROALBUR 20.3 (H) 09/21/2019   LDLCALC 92 04/28/2019   CREATININE 0.78 01/30/2020   Lab Results  Component Value Date   MICRALBCREAT 21.8 09/21/2019    Lab Results  Component Value Date   FRUCTOSAMINE 298 (H) 01/30/2020   FRUCTOSAMINE CANCELED 07/26/2015   FRUCTOSAMINE 264 07/26/2015      Allergies as of 01/31/2020      Reactions   Codeine Swelling, Other (See Comments)   Swelling and burning of mouth (inside)   Lactose Intolerance (gi) Nausea And Vomiting      Medication List       Accurate as of January 31, 2020 10:18 AM. If you have any questions, ask your nurse or doctor.        Accu-Chek Softclix Lancets lancets    albuterol 108 (90 Base) MCG/ACT inhaler Commonly known as: ProAir HFA INHALE 2 PUFFS BY MOUTH EVERY 6 HOURS AS NEEDED FOR WHEEZING OR SHORTNESS OF BREATH   albuterol 1.25 MG/3ML nebulizer solution Commonly known as: ACCUNEB Inhale into the lungs. Use 3 mls by nebulization every 6 hours as needed for wheezing   baclofen 10 MG tablet Commonly known as: LIORESAL Take 1 tablet (10 mg total) by mouth 3 (three) times daily.   budesonide 0.5 MG/2ML nebulizer solution Commonly known as: PULMICORT Inhale 0.5 mg into the lungs daily. Take 2 mls by nebulizations once daily   CALCIUM-VITAMIN D3 PO D3 500- calcium 630 mg Take 1 capsule by mouth once daily   carvedilol 12.5 MG tablet Commonly known as: COREG TAKE 1 TABLET (12.5 MG TOTAL) BY MOUTH 2 (TWO) TIMES DAILY AFTER A MEAL.   clopidogrel 75 MG tablet Commonly known as: PLAVIX Take 1 tablet (75 mg total) by mouth daily.   cycloSPORINE 0.05 % ophthalmic emulsion Commonly known as: RESTASIS Place 2 drops into both eyes 2 (two) times daily.   dapagliflozin propanediol 5 MG Tabs tablet Commonly known as: Farxiga Take 1 tablet (5 mg total) by mouth daily.   DULoxetine 20 MG capsule Commonly known as: Cymbalta Take 2 capsules q am and 1 capsule q pm   Dupixent 300 MG/2ML Sopn Generic drug: Dupilumab Inject 300 mg into the skin every 14 (fourteen) days.   EPINEPHrine 0.3 mg/0.3 mL Soaj injection Commonly known as: EPI-PEN Inject 0.3 mg into the muscle as needed for anaphylaxis.   ezetimibe 10 MG tablet Commonly known as: Zetia Take 1 tablet (10 mg total) by mouth daily.   fluticasone 50 MCG/ACT nasal spray Commonly known as: FLONASE Place 2 sprays into both nostrils daily.   Fluticasone-Salmeterol 100-50 MCG/DOSE Aepb Commonly known as: ADVAIR Inhale 1 puff into the lungs 2 (two) times daily.   gabapentin 600 MG tablet Commonly known as: Neurontin 600 mg in the morning, 600 mg in the afternoon, 1200 mg nightly.    insulin glargine (2 Unit Dial) 300 UNIT/ML Solostar Pen Commonly known as: TOUJEO MAX Inject 90 Units into the skin every morning.   levETIRAcetam 500 MG tablet Commonly known as: KEPPRA Take 500 mg by mouth 2 (two) times daily. 1 TAB IN THE MORNING AND .5 TABS AT NIGHT   losartan 100 MG tablet Commonly known as: COZAAR Take 1 tablet (100 mg total) by mouth daily.   mirabegron ER 50 MG Tb24 tablet Commonly known as: Myrbetriq Take  1 tablet (50 mg total) by mouth daily.   nitroGLYCERIN 0.4 MG SL tablet Commonly known as: NITROSTAT   OmniPod Dash System Kit by Does not apply route.   ondansetron 4 MG disintegrating tablet Commonly known as: Zofran ODT Take 1 tablet (4 mg total) by mouth every 8 (eight) hours as needed for nausea or vomiting.   pantoprazole 40 MG tablet Commonly known as: PROTONIX Take 1 tablet (40 mg total) by mouth daily.   polyethylene glycol 17 g packet Commonly known as: MIRALAX / GLYCOLAX Take 17 g by mouth daily as needed for mild constipation.   potassium chloride SA 20 MEQ tablet Commonly known as: Klor-Con M20 You have not needed potassium while in the hospital.  Hold until outpatient followup.   Precision QID Test test strip Generic drug: glucose blood Use 1 each (1 strip total) 4 (four) times daily Use as instructed.   simvastatin 40 MG tablet Commonly known as: ZOCOR Take 40 mg by mouth daily.   spironolactone 25 MG tablet Commonly known as: ALDACTONE Take 1 tablet by mouth daily.   terconazole 0.4 % vaginal cream Commonly known as: TERAZOL 7 Place 1 applicator vaginally at bedtime as needed (irritation).   Trulicity 1.5 CB/4.4HQ Sopn Generic drug: Dulaglutide Inject 1.5 mg into the skin once a week.       Allergies:  Allergies  Allergen Reactions  . Codeine Swelling and Other (See Comments)    Swelling and burning of mouth (inside)  . Lactose Intolerance (Gi) Nausea And Vomiting    Past Medical History:  Diagnosis  Date  . Anxiety and depression 09/13/2006   Qualifier: Diagnosis of  By: Hassell Done [name], Tori Milks    . Arthritis    joint pain   . Asthma   . COPD (chronic obstructive pulmonary disease) (HCC)    chronic bronchitis   . Depression   . Diabetes mellitus    since age 2; type 2 IDDM  . Diabetic neuropathy, painful ([place])    FEET AND HANDS  . Diabetic retinopathy ([place])   . Diastolic dysfunction    a.  Echo 11/17: EF 60-65%, mild LVH, no RWMA, Gr1DD, mild AI, dilated aortic root measuring 38 mm, mildly dilated ascending aorta  . Dilated aortic root ([place])    74m by echo 11/2015  . Dizziness    secondary to diabetes and hypertension   . Epilepsy idiopathic petit mal ([place]    last seizure 2012;controlled w/ topomax  . GERD (gastroesophageal reflux disease)   . Glaucoma    NOT ON ANY EYE DROPS   . Headache(784.0)    migraines   . Heart murmur    born with   . History of stress test    a. 11/17: Normal perfusion, EF 53%, normal study  . Hx of blood clots    hematomas removed from left side of brain from [id number] [id number]   . Hyperlipidemia   . Hypertension   . Legally blind    left eye   . MIGRAINE HEADACHE 09/14/2006   Qualifier: Diagnosis of  By: MarHassell DoneP, NykTori Milks . Myocardial infarction ([facility]  a.  Patient reported history of without objective documentation  . Nephrolithiasis    frequent urination , urination at nite  PT SEEN IN ER 09/22/11 FOR BACK AND RT SIDED PAIN--HAS KNOWN STONE RT URETER AND UA IN ER SHOWED UTI  . Pain 09/23/11   LOWER BACK AND RIGHT SIDE--PT HAS RIGHT URETERAL STONE  .  Pneumonia    hx of 2009  . Pyelonephritis   . Seizures ([place])   . Sleep apnea    [facility] ; mild  . Stress incontinence   . Stroke [facility])    last 2003  RESIDUAL LEFT LEG WEAKNESS--NO OTHER RESIDUAL PROBLEMS    Past Surgical History:  Procedure Laterality Date  . BRAIN HEMATOMA EVACUATION     five procedures total, first procedure when 60  months old, last at 55 years of age.  . CYSTOSCOPY W/ RETROGRADES  09/24/2011   Procedure: CYSTOSCOPY WITH RETROGRADE PYELOGRAM;  Surgeon: Malka So, MD;  Location: [facility];  Service: Urology;  Laterality: Right;  . CYSTOSCOPY WITH URETEROSCOPY  09/24/2011   Procedure: CYSTOSCOPY WITH URETEROSCOPY;  Surgeon: Malka So, MD;  Location: [facility];  Service: Urology;  Laterality: Right;  Balloon dilation right ureter   . CYSTOSCOPY/RETROGRADE/URETEROSCOPY  08/24/2011   Procedure: CYSTOSCOPY/RETROGRADE/URETEROSCOPY;  Surgeon: Molli Hazard, MD;  Location: [facility];  Service: Urology;  Laterality: Right;  Cysto, Right retrograde Pyelogram, right stent placement.   Fatima Blank HERNIA REPAIR  05/05/2011   Procedure: LAPAROSCOPIC INCISIONAL HERNIA;  Surgeon: Rolm Bookbinder, MD;  Location: [place];  Service: General;  Laterality: N/A;  . kidney stone removal    . MASS EXCISION  05/05/2011   Procedure: EXCISION MASS;  Surgeon: Rolm Bookbinder, MD;  Location: [place];  Service: General;  Laterality: Right;  . PCNL    . RETINAL DETACHMENT SURGERY Left 1990  . RIGHT/LEFT HEART CATH AND CORONARY ANGIOGRAPHY N/A 04/28/2019   Procedure: RIGHT/LEFT HEART CATH AND CORONARY ANGIOGRAPHY;  Surgeon: Yolonda Kida, MD;  Location: [place] [facility];  Service: Cardiovascular;  Laterality: N/A;  . SHUNT REMOVAL     shunt inserted at age 55 removed at age 62   . VAGINAL HYSTERECTOMY  1996    Family History  Problem Relation Age of Onset  . Uterine cancer Mother   . Hypertension Mother   . Cancer Mother   . Brain cancer Maternal Grandmother   . Hypertension Maternal Grandmother   . Birth defects Daughter   . Hypertension Daughter   . Cirrhosis Maternal Grandfather   . ADD / ADHD Maternal Grandfather   . Birth defects Maternal Grandfather   . Diabetes Maternal Grandfather   . Anesthesia problems Neg Hx   . Colon cancer Neg Hx   . Esophageal cancer Neg Hx   . Pancreatic cancer Neg Hx     Social  History:  reports that she quit smoking about 33 years ago. Her smoking use included cigarettes. She has a 15.00 pack-year smoking history. She has never used smokeless tobacco. She reports that she does not drink alcohol and does not use drugs.   Review of Systems   Lipid history: Is being treated with simvastatin 40 mg daily    Lab Results  Component Value Date   CHOL 155 04/28/2019   HDL 43 04/28/2019   LDLCALC 92 04/28/2019   TRIG 102 04/28/2019   CHOLHDL 3.6 04/28/2019           Hypertension: Taking Coreg, Losartan 100 mg  BP Readings from Last 3 Encounters:  01/31/20 128/90  01/30/20 140/90  01/25/20 (!) 163/100   Hypokalemia: She has been given 40 mEq of potassium along with 25 mg spironolactone for treatment by PCP  Lab Results  Component Value Date   K 3.8 01/30/2020    Most recent eye exam was  in 8/21  Most recent foot exam: 0/03  Diabetic complications: History of neuropathy, microalbuminuria and background retinopathy  She will occasionally take prednisone for her asthma  LABS:  Office Visit on 01/30/2020  Component Date Value Ref Range Status  . Sodium 01/30/2020 138  135 - 145 mEq/L Final  . Potassium 01/30/2020 3.8  3.5 - 5.1 mEq/L Final  . Chloride 01/30/2020 104  96 - 112 mEq/L Final  . CO2 01/30/2020 32  19 - 32 mEq/L Final  . Glucose, Bld 01/30/2020 91  70 - 99 mg/dL Final  . BUN 01/30/2020 14  6 - 23 mg/dL Final  . Creatinine, Ser 01/30/2020 0.78  0.40 - 1.20 mg/dL Final  . Total Bilirubin 01/30/2020 0.4  0.2 - 1.2 mg/dL Final  . Alkaline Phosphatase 01/30/2020 71  39 - 117 U/L Final  . AST 01/30/2020 15  0 - 37 U/L Final  . ALT 01/30/2020 16  0 - 35 U/L Final  . Total Protein 01/30/2020 7.4  6.0 - 8.3 g/dL Final  . Albumin 01/30/2020 4.0  3.5 - 5.2 g/dL Final  . GFR 01/30/2020 85.55  >60.00 mL/min Final   Calculated using the CKD-EPI Creatinine Equation (2021)  . Calcium 01/30/2020 9.4  8.4 - 10.5 mg/dL Final  . Hgb A1c MFr Bld  01/30/2020 9.1* 4.6 - 6.5 % Final   Glycemic Control Guidelines for People with Diabetes:Non Diabetic:  <6%Goal of Therapy: <7%Additional Action Suggested:  >8%   . Fructosamine 01/30/2020 298* 0 - 285 umol/L Final   Comment: Published reference interval for apparently healthy subjects between age 25 and 70 is 94 - 285 umol/L and in a poorly controlled diabetic population is 228 - 563 umol/L with a mean of 396 umol/L.     Physical Examination:  BP 128/90   Pulse 77   Ht 5' 5"  (1.651 m)   LMP  (LMP Unknown)   SpO2 98%   BMI 42.30 kg/m       ASSESSMENT:  Diabetes type 2, uncontrolled, insulin-dependent  See history of present illness for detailed discussion of current diabetes management, blood sugar patterns and problems identified     Her A1c is 9.1  She is on insulin pump since 2 days ago using Humalog and her OmniPod pump Her insulin pump was downloaded and blood sugar history reviewed from her meter day-to-day management was discussed with her as well as current blood sugar patterns and target  Her blood sugars are surprisingly practically normal and she is using nearly half of what her basal insulin requirement needs to be previously Blood sugars have been as low as 75 fasting this morning and only above 100 once yesterday   PLAN:   Basal rate 1.8 12 AM-6 AM and then 1.9 Maximum bolus will be 15 units but for smaller meals she can take 10 to 12 units She will discuss and review the bolus technique as she is sometimes doubling up on her boluses by mistake Can probably get by with 5 to 8 units bolus for Glucerna Advised her to avoid Glucerna except as a meal to reduce calorie intake  She can stop Trulicity when finished as this has not apparently helped Increase Farxiga to 10 mg  Follow-up in 1 month  Patient Instructions  Stop Trulicity Farxiga 70WU daily     Elayne Snare 01/31/2020, 10:18 AM   Note: This office note was prepared with Dragon voice  recognition system technology. Any transcriptional errors that result from this process are  unintentional.

## 2020-01-31 NOTE — Telephone Encounter (Signed)
Patient called re: Dr. Dwyane Dee was supposed to send a RX for the following: RX for Humalog in the vial for [facility]od [id number] [place]  [phone] Fax:  [phone]    The only RX patient received after visit was FIran

## 2020-01-31 NOTE — Progress Notes (Signed)
Patient did a pod change with little help from me.  She did require help in making the basal changes per Dr. Ronnie Derby orders.  We reviewed all other topics on her checklist and she was given a pump packet with information on sick day guidelines, high and low blood sugar protocols and emergency kit supplies needed and infusion site selections.   We reviewed each handout and she had no final questions.  She signed the checklist as understanding all topics and had no final questions.

## 2020-01-31 NOTE — Progress Notes (Signed)
We reviewed what was covered yesterday.  History shows patient had given supper bolus X3 last night.  She was not sure she gave this.  She was shown on the PDM home screen and history screen how to tell if she had given the bolus, and she reported good understanding of this.  We reviewed sick days, how to use the temp basal, high blood sugar protocols, and reviewed again how to bolus.  She reported good understanding of all of the above.  She will brink a pod in tomorrow and we will do a pod change, because she wants me to observe her at this.  No changes were made to pump settings.    She had no final questions.

## 2020-01-31 NOTE — Patient Instructions (Signed)
Continue to read manual and resource guide

## 2020-01-31 NOTE — Patient Instructions (Signed)
Continue to read resource manual, pump manuel, and handout given Call

## 2020-01-31 NOTE — Patient Instructions (Signed)
Stop Trulicity Farxiga 55YO daily

## 2020-01-31 NOTE — Telephone Encounter (Signed)
Patient reports that [facility] can not send her her Libre 2s.  [facility]  This is Libre 2 sensors 1Q 14 days .   She does not need the reader.  Thank you

## 2020-02-01 ENCOUNTER — Ambulatory Visit: Payer: Medicare Other | Admitting: Endocrinology

## 2020-02-09 ENCOUNTER — Other Ambulatory Visit: Payer: Self-pay | Admitting: Student in an Organized Health Care Education/Training Program

## 2020-02-09 ENCOUNTER — Telehealth: Payer: Self-pay | Admitting: Endocrinology

## 2020-02-09 ENCOUNTER — Other Ambulatory Visit: Payer: Self-pay | Admitting: *Deleted

## 2020-02-09 ENCOUNTER — Telehealth: Payer: Self-pay | Admitting: Nutrition

## 2020-02-09 ENCOUNTER — Other Ambulatory Visit: Payer: Self-pay | Admitting: Endocrinology

## 2020-02-09 DIAGNOSIS — IMO0002 Reserved for concepts with insufficient information to code with codable children: Secondary | ICD-10-CM

## 2020-02-09 DIAGNOSIS — E1129 Type 2 diabetes mellitus with other diabetic kidney complication: Secondary | ICD-10-CM

## 2020-02-09 MED ORDER — INSULIN LISPRO 100 UNIT/ML ~~LOC~~ SOLN: SUBCUTANEOUS | 0 refills | Status: DC

## 2020-02-09 NOTE — Telephone Encounter (Signed)
Patient called and wanted you to know that her Omnipod is not lasting 3 days and she is running out of pods.  Is requesting a call back at [phone] to assist with this situation.

## 2020-02-09 NOTE — Telephone Encounter (Signed)
Rx update and resent to pharmacy per patient request.

## 2020-02-09 NOTE — Telephone Encounter (Signed)
Patient called and requested that her RX for Humalog be resent to pharmacy for a 90 day supply with updated dosing instructions.  Pharmacy is [facility] in [place]  Call back # [phone]

## 2020-02-12 ENCOUNTER — Telehealth: Payer: Self-pay | Admitting: *Deleted

## 2020-02-12 NOTE — Telephone Encounter (Signed)
Pt called stating she has run out of her Omnipod medication and has no way to get to [facility] for samples because she doesn't have any transportation. Pt states the prescription needs to be rewritten so that insurance will pay for it. Pharmacy says she has been taking too much but pt states Vaughan Basta told her to fill to 200 (max) but prescription says 150 so it needs to be rewritten.   [id number] [place]  [phone] Fax:  [phone]

## 2020-02-13 ENCOUNTER — Encounter: Payer: Medicare Other | Admitting: Nutrition

## 2020-02-13 ENCOUNTER — Encounter: Payer: Self-pay | Admitting: Student in an Organized Health Care Education/Training Program

## 2020-02-13 NOTE — Telephone Encounter (Signed)
I spoke with patient and she says she can not get her supplies until the 27th, but they gave her a bottle in advance.  The pharmacy was called and it is written correctly for 90u/day, and she will get 9 vials for 3 months starting on 02/15/20.  Pt. Notified of this.

## 2020-02-13 NOTE — Telephone Encounter (Signed)
Thanks for clarifying this

## 2020-02-14 ENCOUNTER — Telehealth: Payer: Self-pay | Admitting: Endocrinology

## 2020-02-14 ENCOUNTER — Other Ambulatory Visit: Payer: Self-pay

## 2020-02-14 ENCOUNTER — Other Ambulatory Visit: Payer: Self-pay | Admitting: *Deleted

## 2020-02-14 ENCOUNTER — Encounter: Payer: Medicare Other | Admitting: Nutrition

## 2020-02-14 DIAGNOSIS — E1129 Type 2 diabetes mellitus with other diabetic kidney complication: Secondary | ICD-10-CM

## 2020-02-14 DIAGNOSIS — IMO0002 Reserved for concepts with insufficient information to code with codable children: Secondary | ICD-10-CM

## 2020-02-14 MED ORDER — FREESTYLE LIBRE 2 SENSOR MISC: 1 refills | Status: DC

## 2020-02-14 MED ORDER — GABAPENTIN 600 MG PO TABS: ORAL_TABLET | ORAL | 2 refills | Status: DC

## 2020-02-14 NOTE — Telephone Encounter (Signed)
Pump download indicates mostly higher sugars in the afternoons, will increase basal rate from 12 PM-6 PM up to 2.1, please call

## 2020-02-14 NOTE — Telephone Encounter (Signed)
Patient talked through making the changes to her basal rate per DR. Kumar's note.

## 2020-02-14 NOTE — Telephone Encounter (Signed)
Requested Prescriptions   Signed Prescriptions Disp Refills  . gabapentin (NEURONTIN) 600 MG tablet 120 tablet 2    Sig: 600 mg in the morning, 600 mg in the afternoon, 1200 mg nightly.    Authorizing Provider: Gillis Santa

## 2020-02-14 NOTE — Progress Notes (Signed)
I have sent in the prescription for the libre 2 14 day sensors to [facility].

## 2020-02-14 NOTE — Progress Notes (Signed)
See previous note.  I called pharmacy and told her that she is on 90u/day.  They are sending her what she will need for 3 months.  Can you please order her Freestyle Libre 2 sensors 1 q 14 days to [facility].  I just contacted them and they are able to send these to her.  Thank you

## 2020-02-15 ENCOUNTER — Telehealth: Payer: Self-pay | Admitting: Endocrinology

## 2020-02-15 ENCOUNTER — Other Ambulatory Visit: Payer: Self-pay | Admitting: *Deleted

## 2020-02-15 MED ORDER — INSULIN LISPRO 100 UNIT/ML ~~LOC~~ SOLN: SUBCUTANEOUS | 1 refills | Status: DC

## 2020-02-15 NOTE — Patient Instructions (Signed)
Review handouts given and Call Omni Pod help line if questions about the pump.

## 2020-02-15 NOTE — Telephone Encounter (Signed)
Patient called and stated [facility] [address] [facility] her Hum[facility]og and they said she could not get it until 1/30.  Patient states that she will run out 1/28 when she needs to do another POD.  She states 1 POD lasts her 2 days.  Discussed with Suanne Marker who updated patient's prescription for the pharmacy.  Called patient and informed her of changes made to prescription.  Patient to call for further questions.  Antonieta Iba, RD, LDN, CDCES

## 2020-02-15 NOTE — Telephone Encounter (Signed)
Spoke with pt and she is still having issues with [facility] not [facility] for her Humalog 150 Unit ML. [facility] for 100 units but pt says correct unit is 150. Pt also states [facility] Reader called [facility] to try and straighten things out yesterday and thought it had been changed but it is still not showing that update and pt needs medication.   [id number] [place]:  [phone] Fax:  [phone]

## 2020-02-15 NOTE — Telephone Encounter (Signed)
New Rx has been sent to use up to 150 units in her pump

## 2020-02-15 NOTE — Progress Notes (Signed)
We reviewed all topics on the checklist as well as high blood sugar protocols, sick days and how to make changes to the pump settings.  Handouts were given on above topics. Patient reported good understanding of these topics, but said she is not able to get her Elenor Legato sensors.  Insurance company was called to see where they would accept libre orders and it was [facility].  Note to Dr. Dwyane Dee to order these from there.  PDM was downloaded and put on Dr. Ronnie Derby desk.  She reports that she is bolusing for all meals and snacks and does a correction dose at HS if blood sugar is high.  She reports liking this pump very much and feeling more comfortable with this every day.  She had no more questions or concerns for me at this time

## 2020-02-19 ENCOUNTER — Other Ambulatory Visit: Payer: Self-pay | Admitting: *Deleted

## 2020-02-19 MED ORDER — FREESTYLE LIBRE 2 SENSOR MISC: 1 refills | Status: DC

## 2020-02-21 NOTE — Telephone Encounter (Signed)
Message left on machine to call her because reader is not covered.  When I called her back, she reports that she has everything fixed with the pharmacy.  She no longer needed to talk to me.

## 2020-02-25 ENCOUNTER — Other Ambulatory Visit: Payer: Self-pay | Admitting: Student in an Organized Health Care Education/Training Program

## 2020-02-25 ENCOUNTER — Other Ambulatory Visit: Payer: Self-pay | Admitting: Endocrinology

## 2020-02-25 DIAGNOSIS — M791 Myalgia, unspecified site: Secondary | ICD-10-CM

## 2020-02-25 DIAGNOSIS — E1142 Type 2 diabetes mellitus with diabetic polyneuropathy: Secondary | ICD-10-CM

## 2020-02-27 ENCOUNTER — Ambulatory Visit (INDEPENDENT_AMBULATORY_CARE_PROVIDER_SITE_OTHER): Payer: Medicare Other

## 2020-02-27 DIAGNOSIS — Z0000 Encounter for general adult medical examination without abnormal findings: Secondary | ICD-10-CM

## 2020-02-27 DIAGNOSIS — Z1211 Encounter for screening for malignant neoplasm of colon: Secondary | ICD-10-CM

## 2020-02-27 NOTE — Patient Instructions (Signed)
[name] , Thank you for taking time to come for your Medicare Wellness Visit. I appreciate your ongoing commitment to your health goals. Please review the following plan we discussed and let me know if I can assist you in the future.   Screening recommendations/referrals: Colonoscopy: Cologuard ordered today; please complete and return.  Mammogram: done 08/13/15. Please call [phone] to schedule your mammogram.  Bone Density: due at age 55 Recommended yearly ophthalmology/optometry visit for glaucoma screening and checkup Recommended yearly dental visit for hygiene and checkup  Vaccinations: Influenza vaccine: done 12/20/19 Pneumococcal vaccine: done 04/29/19 Tdap vaccine: done 06/23/18 Shingles vaccine: Shingrix discussed. Please contact your pharmacy for coverage information.  Covid-19: done 11/29/19 & 12/20/19  Conditions/risks identified: Recommend increasing physical activity as tolerated.   Next appointment: Follow up in one year for your annual wellness visit.   Preventive Care 40-64 Years, Female Preventive care refers to lifestyle choices and visits with your health care provider that can promote health and wellness. What does preventive care include?  A yearly physical exam. This is also called an annual well check.  Dental exams once or twice a year.  Routine eye exams. Ask your health care provider how often you should have your eyes checked.  Personal lifestyle choices, including:  Daily care of your teeth and gums.  Regular physical activity.  Eating a healthy diet.  Avoiding tobacco and drug use.  Limiting alcohol use.  Practicing safe sex.  Taking low-dose aspirin daily starting at age 60.  Taking vitamin and mineral supplements as recommended by your health care provider. What happens during an annual well check? The services and screenings done by your health care provider during your annual well check will depend on your age, overall health,  lifestyle risk factors, and family history of disease. Counseling  Your health care provider may ask you questions about your:  Alcohol use.  Tobacco use.  Drug use.  Emotional well-being.  Home and relationship well-being.  Sexual activity.  Eating habits.  Work and work Statistician.  Method of birth control.  Menstrual cycle.  Pregnancy history. Screening  You may have the following tests or measurements:  Height, weight, and BMI.  Blood pressure.  Lipid and cholesterol levels. These may be checked every 5 years, or more frequently if you are over 28 years old.  Skin check.  Lung cancer screening. You may have this screening every year starting at age 72 if you have a 30-pack-year history of smoking and currently smoke or have quit within the past 15 years.  Fecal occult blood test (FOBT) of the stool. You may have this test every year starting at age 48.  Flexible sigmoidoscopy or colonoscopy. You may have a sigmoidoscopy every 5 years or a colonoscopy every 10 years starting at age 11.  Hepatitis C blood test.  Hepatitis B blood test.  Sexually transmitted disease (STD) testing.  Diabetes screening. This is done by checking your blood sugar (glucose) after you have not eaten for a while (fasting). You may have this done every 1-3 years.  Mammogram. This may be done every 1-2 years. Talk to your health care provider about when you should start having regular mammograms. This may depend on whether you have a family history of breast cancer.  BRCA-related cancer screening. This may be done if you have a family history of breast, ovarian, tubal, or peritoneal cancers.  Pelvic exam and Pap test. This may be done every 3 years starting at age 28. Starting  at age 94, this may be done every 5 years if you have a Pap test in combination with an HPV test.  Bone density scan. This is done to screen for osteoporosis. You may have this scan if you are at high risk for  osteoporosis. Discuss your test results, treatment options, and if necessary, the need for more tests with your health care provider. Vaccines  Your health care provider may recommend certain vaccines, such as:  Influenza vaccine. This is recommended every year.  Tetanus, diphtheria, and acellular pertussis (Tdap, Td) vaccine. You may need a Td booster every 10 years.  Zoster vaccine. You may need this after age 22.  Pneumococcal 13-valent conjugate (PCV13) vaccine. You may need this if you have certain conditions and were not previously vaccinated.  Pneumococcal polysaccharide (PPSV23) vaccine. You may need one or two doses if you smoke cigarettes or if you have certain conditions. Talk to your health care provider about which screenings and vaccines you need and how often you need them. This information is not intended to replace advice given to you by your health care provider. Make sure you discuss any questions you have with your health care provider. Document Released: 02/01/2015 Document Revised: 09/25/2015 Document Reviewed: 11/06/2014 Elsevier Interactive Patient Education  2017 [employer] Prevention in the Home Falls can cause injuries. They can happen to people of all ages. There are many things you can do to make your home safe and to help prevent falls. What can I do on the outside of my home?  Regularly fix the edges of walkways and driveways and fix any cracks.  Remove anything that might make you trip as you walk through a door, such as a raised step or threshold.  Trim any bushes or trees on the path to your home.  Use bright outdoor lighting.  Clear any walking paths of anything that might make someone trip, such as rocks or tools.  Regularly check to see if handrails are loose or broken. Make sure that both sides of any steps have handrails.  Any raised decks and porches should have guardrails on the edges.  Have any leaves, snow, or ice cleared  regularly.  Use sand or salt on walking paths during winter.  Clean up any spills in your garage right away. This includes oil or grease spills. What can I do in the bathroom?  Use night lights.  Install grab bars by the toilet and in the tub and shower. Do not use towel bars as grab bars.  Use non-skid mats or decals in the tub or shower.  If you need to sit down in the shower, use a plastic, non-slip stool.  Keep the floor dry. Clean up any water that spills on the floor as soon as it happens.  Remove soap buildup in the tub or shower regularly.  Attach bath mats securely with double-sided non-slip rug tape.  Do not have throw rugs and other things on the floor that can make you trip. What can I do in the bedroom?  Use night lights.  Make sure that you have a light by your bed that is easy to reach.  Do not use any sheets or blankets that are too big for your bed. They should not hang down onto the floor.  Have a firm chair that has side arms. You can use this for support while you get dressed.  Do not have throw rugs and other things on the floor  that can make you trip. What can I do in the kitchen?  Clean up any spills right away.  Avoid walking on wet floors.  Keep items that you use a lot in easy-to-reach places.  If you need to reach something above you, use a strong step stool that has a grab bar.  Keep electrical cords out of the way.  Do not use floor polish or wax that makes floors slippery. If you must use wax, use non-skid floor wax.  Do not have throw rugs and other things on the floor that can make you trip. What can I do with my stairs?  Do not leave any items on the stairs.  Make sure that there are handrails on both sides of the stairs and use them. Fix handrails that are broken or loose. Make sure that handrails are as long as the stairways.  Check any carpeting to make sure that it is firmly attached to the stairs. Fix any carpet that is loose  or worn.  Avoid having throw rugs at the top or bottom of the stairs. If you do have throw rugs, attach them to the floor with carpet tape.  Make sure that you have a light switch at the top of the stairs and the bottom of the stairs. If you do not have them, ask someone to add them for you. What else can I do to help prevent falls?  Wear shoes that:  Do not have high heels.  Have rubber bottoms.  Are comfortable and fit you well.  Are closed at the toe. Do not wear sandals.  If you use a stepladder:  Make sure that it is fully opened. Do not climb a closed stepladder.  Make sure that both sides of the stepladder are locked into place.  Ask someone to hold it for you, if possible.  Clearly mark and make sure that you can see:  Any grab bars or handrails.  First and last steps.  Where the edge of each step is.  Use tools that help you move around (mobility aids) if they are needed. These include:  Canes.  Walkers.  Scooters.  Crutches.  Turn on the lights when you go into a dark area. Replace any light bulbs as soon as they burn out.  Set up your furniture so you have a clear path. Avoid moving your furniture around.  If any of your floors are uneven, fix them.  If there are any pets around you, be aware of where they are.  Review your medicines with your doctor. Some medicines can make you feel dizzy. This can increase your chance of falling. Ask your doctor what other things that you can do to help prevent falls. This information is not intended to replace advice given to you by your health care provider. Make sure you discuss any questions you have with your health care provider. Document Released: 11/01/2008 Document Revised: 06/13/2015 Document Reviewed: 02/09/2014 Elsevier Interactive Patient Education  2017 [employer].

## 2020-02-27 NOTE — Progress Notes (Signed)
Subjective:   [name] is a 55 y.o. female who presents for Medicare Annual (Subsequent) preventive examination.  Virtual Visit via Telephone Note  I connected with  [name] on 02/27/20 at 11:20 AM EST by telephone and verified that I am speaking with the correct person using two identifiers.  Location: Patient: home Provider: [place] Persons participating in the virtual visit: [place]   I discussed the limitations, risks, security and privacy concerns of performing an evaluation and management service by telephone and the availability of in person appointments. The patient expressed understanding and agreed to proceed.  Interactive audio and video telecommunications were attempted between this nurse and patient, however failed, due to patient having technical difficulties OR patient did not have access to video capability.  We continued and completed visit with audio only.  Some vital signs may be absent or patient reported.   [name], LPN    Review of Systems     Cardiac Risk Factors include: advanced age (>51mn, >>4women);diabetes mellitus;dyslipidemia;obesity (BMI >30kg/m2);sedentary lifestyle;hypertension     Objective:    There were no vitals filed for this visit. There is no height or weight on file to calculate BMI.  Advanced Directives 02/27/2020 05/30/2019 05/16/2019 05/16/2019 05/15/2019 04/27/2019 04/27/2019  Does Patient Have a Medical Advance Directive? Yes Yes Yes - No Yes Yes  Type of [place] Healthcare Power of [facility] Does patient want to make changes to medical advance directive? - - No - Patient declined No - Patient declined - No - Guardian declined -  Copy of HHarrisonin Chart? Yes - validated most recent copy scanned in chart (See row information) - No  - copy requested - - No - copy requested -  Would patient like information on creating a medical advance directive? - - - - - - -  Pre-existing out of facility DNR order (yellow form or pink MOST form) - - - - - - -    Current Medications (verified) Outpatient Encounter Medications as of 02/27/2020  Medication Sig  . Accu-Chek Softclix Lancets lancets   . albuterol (PROAIR HFA) 108 (90 Base) MCG/ACT inhaler INHALE 2 PUFFS BY MOUTH EVERY 6 HOURS AS NEEDED FOR WHEEZING OR SHORTNESS OF BREATH  . baclofen (LIORESAL) 10 MG tablet Take 1 tablet (10 mg total) by mouth 3 (three) times daily.  . Calcium Carbonate-Vitamin D (CALCIUM-VITAMIN D3 PO) D3 500- calcium 630 mg Take 1 capsule by mouth once daily  . clopidogrel (PLAVIX) 75 MG tablet Take 1 tablet (75 mg total) by mouth daily.  . Continuous Blood Gluc Sensor (FREESTYLE LIBRE 2 SENSOR) MISC Use 1 sensor every 14 days  . cycloSPORINE (RESTASIS) 0.05 % ophthalmic emulsion Place 2 drops into both eyes 2 (two) times daily.  . dapagliflozin propanediol (FARXIGA) 10 MG TABS tablet Take 1 tablet (10 mg total) by mouth daily before breakfast.  . DULoxetine (CYMBALTA) 20 MG capsule Take 2 capsules q am and 1 capsule q pm  . DUPIXENT 300 MG/2ML SOPN Inject 300 mg into the skin every 14 (fourteen) days.  .Marland KitchenEPINEPHrine 0.3 mg/0.3 mL IJ SOAJ injection Inject 0.3 mg into the muscle as needed for anaphylaxis.   .Marland Kitchenezetimibe (ZETIA) 10 MG tablet Take 1 tablet (10 mg total) by mouth daily.  . fluticasone (FLONASE) 50 MCG/ACT nasal spray Place 2 sprays into  both nostrils daily.  [name] gabapentin (NEURONTIN) 600 MG tablet 600 mg in the morning, 600 mg in the afternoon, 1200 mg nightly.  [name] glucose blood (PRECISION QID TEST) test strip Use 1 each (1 strip total) 4 (four) times daily Use as instructed.  . Insulin Disposable Pump (OMNIPOD DASH SYSTEM) KIT by Does not apply route.  . insulin lispro (HUMALOG) 100 UNIT/ML injection Use up to 150 units/day in insulin pump  .  levETIRAcetam (KEPPRA) 500 MG tablet Take 500 mg by mouth 2 (two) times daily. 1 TAB IN THE MORNING AND .5 TABS AT NIGHT  . losartan (COZAAR) 100 MG tablet Take 1 tablet (100 mg total) by mouth daily.  . mirabegron ER (MYRBETRIQ) 50 MG TB24 tablet Take 1 tablet (50 mg total) by mouth daily.  . nitroGLYCERIN (NITROSTAT) 0.4 MG SL tablet   . pantoprazole (PROTONIX) 40 MG tablet Take 1 tablet (40 mg total) by mouth daily.  . polyethylene glycol (MIRALAX / GLYCOLAX) 17 g packet Take 17 g by mouth daily as needed for mild constipation.  . potassium chloride SA (KLOR-CON M20) 20 MEQ tablet You have not needed potassium while in the hospital.  Hold until outpatient followup.  . simvastatin (ZOCOR) 40 MG tablet Take 40 mg by mouth daily.  [name] spironolactone (ALDACTONE) 25 MG tablet Take 1 tablet by mouth daily.  [place] Ruts INHUB 250-50 MCG/DOSE AEPB Inhale 1 puff into the lungs 2 (two) times daily.  [name] albuterol (ACCUNEB) 1.25 MG/3ML nebulizer solution Inhale into the lungs. Use 3 mls by nebulization every 6 hours as needed for wheezing  . budesonide (PULMICORT) 0.5 MG/2ML nebulizer solution Inhale 0.5 mg into the lungs daily. Take 2 mls by nebulizations once daily   . carvedilol (COREG) 12.5 MG tablet TAKE 1 TABLET (12.5 MG TOTAL) BY MOUTH 2 (TWO) TIMES DAILY AFTER A MEAL.  [name] terconazole (TERAZOL 7) 0.4 % vaginal cream Place 1 applicator vaginally at bedtime as needed (irritation). (Patient not taking: Reported on 02/27/2020)  . [DISCONTINUED] Dulaglutide (TRULICITY) 1.5 RF/1.6BW SOPN Inject 1.5 mg into the skin once a week.  . [DISCONTINUED] FARXIGA 5 MG TABS tablet TAKE 1 TABLET BY MOUTH EVERY DAY  . [DISCONTINUED] Fluticasone-Salmeterol (ADVAIR) 100-50 MCG/DOSE AEPB Inhale 1 puff into the lungs 2 (two) times daily.  . [DISCONTINUED] ondansetron (ZOFRAN ODT) 4 MG disintegrating tablet Take 1 tablet (4 mg total) by mouth every 8 (eight) hours as needed for nausea or vomiting.  . [DISCONTINUED] TOUJEO MAX  SOLOSTAR 300 UNIT/ML Solostar Pen INJECT 90 UNITS INTO THE SKIN EVERY MORNING.   No facility-administered encounter medications on file as of 02/27/2020.    Allergies (verified) Codeine and Lactose intolerance (gi)   History: Past Medical History:  Diagnosis Date  . Anxiety and depression 09/13/2006   Qualifier: Diagnosis of  By: Hassell Done [name], Tori Milks    . Arthritis    joint pain   . Asthma   . COPD (chronic obstructive pulmonary disease) (HCC)    chronic bronchitis   . Depression   . Diabetes mellitus    since age 11; type 2 IDDM  . Diabetic neuropathy, painful ([place])    FEET AND HANDS  . Diabetic retinopathy ([place])   . Diastolic dysfunction    a.  Echo 11/17: EF 60-65%, mild LVH, no RWMA, Gr1DD, mild AI, dilated aortic root measuring 38 mm, mildly dilated ascending aorta  . Dilated aortic root ([place])    29m by echo 11/2015  . Dizziness    secondary to  diabetes and hypertension   . Epilepsy idiopathic petit mal ([place])    last seizure 2012;controlled w/ topomax  . GERD (gastroesophageal reflux disease)   . Glaucoma    NOT ON ANY EYE DROPS   . Headache(784.0)    migraines   . Heart murmur    born with   . History of stress test    a. 11/17: Normal perfusion, EF 53%, normal study  . Hx of blood clots    hematomas removed from left side of brain from 6mo to 89yrold   . Hyperlipidemia   . Hypertension   . Legally blind    left eye   . MIGRAINE HEADACHE 09/14/2006   Qualifier: Diagnosis of  By: MaHassell DoneNP, NyTori Milks  . Myocardial infarction ([facility]   a.  Patient reported history of without objective documentation  . Nephrolithiasis    frequent urination , urination at nite  PT SEEN IN ER 09/22/11 FOR BACK AND RT SIDED PAIN--HAS KNOWN STONE RT URETER AND UA IN ER SHOWED UTI  . Pain 09/23/11   LOWER BACK AND RIGHT SIDE--PT HAS RIGHT URETERAL STONE  . Pneumonia    hx of 2009  . Pyelonephritis   . Seizures ([facility]  . Sleep apnea    [facility]  Cpap ; mild  . Stress incontinence   . [facility]   [facility]  RESIDUAL LEFT LEG WEAKNESS--NO OTHER RESIDUAL PROBLEMS   Past Surgical History:  Procedure Laterality Date  . BRAIN HEMATOMA EVACUATION     five procedures total, first procedure when 899 monthsld, last at [age]ears of age.  . CYSTOSCOPY W/ RETROGRADES  09/24/2011   Procedure: CYSTOSCOPY WITH RETROGRADE PYELOGRAM;  Surgeon: JoMalka SoMD;  Location: [facility];  Service: Urology;  Laterality: Right;  . CYSTOSCOPY WITH URETEROSCOPY  09/24/2011   Procedure: CYSTOSCOPY WITH URETEROSCOPY;  Surgeon: JoMalka SoMD;  Location: [facility];  Service: Urology;  Laterality: Right;  Balloon dilation right ureter   . CYSTOSCOPY/RETROGRADE/URETEROSCOPY  08/24/2011   Procedure: CYSTOSCOPY/RETROGRADE/URETEROSCOPY;  Surgeon: DaMolli HazardMD;  Location: [facility];  Service: Urology;  Laterality: Right;  Cysto, Right retrograde Pyelogram, right stent placement.   . Fatima BlankERNIA REPAIR  05/05/2011   Procedure: LAPAROSCOPIC INCISIONAL HERNIA;  Surgeon: MaRolm BookbinderMD;  [facility] Service: General;  Laterality: N/A;  . kidney stone removal    . MASS EXCISION  05/05/2011   Procedure: EXCISION MASS;  Surgeon: MaRolm BookbinderMD;  [facility]: [facility];  Laterality: Right;  . PCNL    . RETINAL DETACHMENT SURGERY Left 1990  . RIGHT/LEFT HEART CATH AND CORONARY ANGIOGRAPHY N/A 04/28/2019   Procedure: RIGHT/LEFT HEART CATH AND CORONARY ANGIOGRAPHY;  Surgeon: CaYolonda KidaMD;  [facility];  Service: Cardiovascular;  Laterality: N/A;  . SHUNT REMOVAL     shunt inserted at age 35 18emoved at age 55 . VAGINAL HYSTERECTOMY  1996   Family History  Problem Relation Age of Onset  . Uterine cancer Mother   . Hypertension Mother   . Cancer Mother   . Brain cancer Maternal Grandmother   . Hypertension Maternal Grandmother   . Birth defects Daughter   . Hypertension Daughter   . Cirrhosis Maternal  Grandfather   . ADD / ADHD Maternal Grandfather   . Birth defects Maternal Grandfather   . Diabetes Maternal Grandfather   .  Anesthesia problems Neg Hx   . Colon cancer Neg Hx   . Esophageal cancer Neg Hx   . Pancreatic cancer Neg Hx    Social History   Socioeconomic History  . Marital status: Widowed    Spouse name: Not on file  . Number of children: 2  . Years of education: Not on file  . Highest education level: Not on file  Occupational History  . Occupation: disabled  Tobacco Use  . Smoking status: Former Smoker    Packs/day: 1.00    Years: 15.00    Pack years: 15.00    Types: Cigarettes    Quit date: 01/20/1987    Years since quitting: 33.1  . Smokeless tobacco: Never Used  . Tobacco comment: quit more than 20 years  Vaping Use  . Vaping Use: Never used  Substance and Sexual Activity  . Alcohol use: Never    Alcohol/week: 0.0 standard drinks  . Drug use: No    Comment: hx of marijuana use no longer uses   . Sexual activity: Yes    Partners: Male    Birth control/protection: Surgical  Other Topics Concern  . Not on file  Social History Narrative   Pt lives with her daughter   Social Determinants of Health   Financial Resource Strain: Low Risk   . Difficulty of Paying Living Expenses: Not very hard  Food Insecurity: No Food Insecurity  . Worried About Charity fundraiser in the Last Year: Never true  . Ran Out of Food in the Last Year: Never true  Transportation Needs: No Transportation Needs  . Lack of Transportation (Medical): No  . Lack of Transportation (Non-Medical): No  Physical Activity: Insufficiently Active  . Days of Exercise per Week: 2 days  . Minutes of Exercise per Session: 20 min  Stress: No Stress Concern Present  . Feeling of Stress : Not at all  Social Connections: Socially Isolated  . Frequency of Communication with Friends and Family: More than three times a week  . Frequency of Social Gatherings with Friends and Family: More than  three times a week  . Attends Religious Services: Never  . Active Member of Clubs or Organizations: No  . Attends Archivist Meetings: Never  . Marital Status: Widowed    Tobacco Counseling Counseling given: Not Answered Comment: quit more than 20 years   Clinical Intake:  Pre-visit preparation completed: Yes  Pain : No/denies pain     Nutritional Status: BMI > 30  Obese Nutritional Risks: None Diabetes: Yes CBG done?: No Did pt. bring in CBG monitor from home?: No  How often do you need to have someone help you when you read instructions, pamphlets, or other written materials from your doctor or pharmacy?: 1 - Never  Nutrition Risk Assessment:  Has the patient had any N/V/D within the last 2 months?  No  Does the patient have any non-healing wounds?  No  Has the patient had any unintentional weight loss or weight gain?  No   Diabetes:  Is the patient diabetic?  Yes  If diabetic, was a CBG obtained today?  No  Did the patient bring in their glucometer from home?  No  How often do you monitor your CBG's? Several times per day with CGM.   Financial Strains and Diabetes Management:  Are you having any financial strains with the device, your supplies or your medication? No .  Does the patient want to be seen by Chronic Care  Management for management of their diabetes?  Yes  - already enrolled Would the patient like to be referred to a Nutritionist or for Diabetic Management?  Yes  already enrolled  Diabetic Exams:  Diabetic Eye Exam: Completed 09/18/19.   Diabetic Foot Exam: Completed 09/22/18. Pt has been advised about the importance in completing this exam. Pt is scheduled for diabetic foot exam on 02/29/20.    Interpreter Needed?: No  Information entered by :: [name] LPN   Activities of Daily Living In your present state of health, do you have any difficulty performing the following activities: 02/27/2020 12/12/2019  Hearing? N N  Comment declines  hearing aids -  Vision? Y Y  Comment - -  Difficulty concentrating or making decisions? N N  Walking or climbing stairs? Y Y  Dressing or bathing? N Y  Doing errands, shopping? N N  Preparing Food and eating ? N -  Using the Toilet? N -  In the past six months, have you accidently leaked urine? N -  Do you have problems with loss of bowel control? N -  Managing your Medications? N -  Managing your Finances? N -  Housekeeping or managing your Housekeeping? N -  Some recent data might be hidden    Patient Care Team: Delsa Grana, [name] as PCP - General (Family Medicine) Elayne Snare, MD as Consulting Physician (Endocrinology) Schnier, Dolores Lory, MD (Vascular Surgery) Anabel Bene, MD as Referring Physician (Neurology) Yolonda Kida, MD as Consulting Physician (Cardiology) Gillis Santa, MD as Consulting Physician (Pain Medicine)  Indicate any recent Medical Services you may have received from other than [facility] providers in the past year (date may be approximate).     Assessment:   This is a routine wellness examination for [place].  Hearing/Vision screen  Hearing Screening   125Hz  250Hz  500Hz  1000Hz  2000Hz  3000Hz  4000Hz  6000Hz  8000Hz   Right ear:           Left ear:           Comments: Pt denies hearing difficulty  Vision Screening Comments: Annual vision screening done at [facility]. Pt is legally blind in right eye  Dietary issues and exercise activities discussed: Current Exercise Habits: Home exercise routine, Type of exercise: calisthenics, Time (Minutes): 20, Frequency (Times/Week): 2, Weekly Exercise (Minutes/Week): 40, Intensity: Mild, Exercise limited by: orthopedic condition(s);neurologic condition(s);respiratory conditions(s)     Depression Screen PHQ 2/9 Scores 02/27/2020 12/12/2019 09/14/2019 06/01/2019 05/05/2019 04/18/2019 03/01/2019  PHQ - 2 Score 0 2 0 0 0 0 0  PHQ- 9 Score - 4 - 0 1 2 2     Fall Risk Fall Risk  02/27/2020 12/12/2019 09/14/2019  06/01/2019 05/05/2019  Falls in the past year? 0 1 1 1 1   Number falls in past yr: 0 1 1 1  0  Comment - - 3 - -  Injury with Fall? 0 0 0 0 0  Risk for fall due to : Impaired balance/gait;Impaired vision History of fall(s) History of fall(s) Impaired balance/gait;History of fall(s);Impaired mobility -  Risk for fall due to: Comment - - - - -  Follow up Falls prevention discussed Falls evaluation completed Falls evaluation completed - -    FALL RISK PREVENTION PERTAINING TO THE HOME:  Any stairs in or around the home? Yes  If so, are there any without handrails? No  Home free of loose throw rugs in walkways, pet beds, electrical cords, etc? Yes  Adequate lighting in your home to reduce risk of  falls? Yes   ASSISTIVE DEVICES UTILIZED TO PREVENT FALLS:  Life alert? No  Use of a cane, walker or w/c? Yes  Grab bars in the bathroom? Yes  Shower chair or bench in shower? Yes  Elevated toilet seat or a handicapped toilet? Yes   TIMED UP AND GO:  Was the test performed? No . Telephonic visit.   Cognitive Function: Normal cognitive status assessed by direct observation by this Nurse Health Advisor. No abnormalities found.          Immunizations Immunization History  Administered Date(s) Administered  . Influenza Split 10/07/2012  . Influenza Whole 11/20/2006  . Influenza,inj,Quad PF,6+ Mos 10/30/2013, 10/19/2014, 01/24/2018, 10/12/2018  . Influenza-Unspecified 12/20/2019  . PFIZER(Purple Top)SARS-COV-2 Vaccination 11/29/2019, 12/20/2019  . Pneumococcal Conjugate-13 04/29/2015  . Pneumococcal Polysaccharide-23 11/20/2006, 09/25/2011, 04/29/2019  . Tdap 06/23/2018    TDAP status: Up to date  Flu Vaccine status: Up to date  Pneumococcal vaccine status: Up to date  Covid-19 vaccine status: Completed vaccines  Qualifies for Shingles Vaccine? Yes   Zostavax completed No   Shingrix Completed?: No.    Education has been provided regarding the importance of this vaccine. Patient  has been advised to call insurance company to determine out of pocket expense if they have not yet received this vaccine. Advised may also receive vaccine at local pharmacy or Health Dept. Verbalized acceptance and understanding.  Screening Tests Health Maintenance  Topic Date Due  . Hepatitis C Screening  Never done  . MAMMOGRAM  08/12/2017  . FOOT EXAM  09/22/2019  . COVID-19 Vaccine (3 - Booster for Pfizer series) 06/19/2020  . HEMOGLOBIN A1C  07/29/2020  . OPHTHALMOLOGY EXAM  09/13/2020  . PAP-Cervical Cytology Screening  06/23/2021  . PAP SMEAR-Modifier  06/23/2021  . COLONOSCOPY (Pts 45-61yr Insurance coverage will need to be confirmed)  01/20/2027  . TETANUS/TDAP  06/22/2028  . INFLUENZA VACCINE  Completed  . PNEUMOCOCCAL POLYSACCHARIDE VACCINE AGE 53-64 HIGH RISK  Completed  . HIV Screening  Completed    Health Maintenance  Health Maintenance Due  Topic Date Due  . Hepatitis C Screening  Never done  . MAMMOGRAM  08/12/2017  . FOOT EXAM  09/22/2019   Colorectal cancer screening: pt states per GI she cannot undergo colonoscopy due to sleep apnea. Per patient Cologuard had been ordered. Unable to find results from previous cologuard and likely it has been more than 3 years since completed. Cologuard ordered today.   Mammogram status: Completed 08/13/15. Repeat every year. Ordered 09/14/19  Bone density screening status: due at age 89 Lung Cancer Screening: (Low Dose CT Chest recommended if Age 56-80years, 30 pack-year currently smoking OR have quit w/in 15years.) does not qualify.   Additional Screening:  Hepatitis C Screening: does qualify; postponed  Vision Screening: Recommended annual ophthalmology exams for early detection of glaucoma and other disorders of the eye. Is the patient up to date with their annual eye exam?  Yes  Who is the provider or what is the name of the office in which the patient [facility] exams[facility]: Recommended annual dental exams for proper oral hygiene  Community Resource Referral / Chronic Care Management: CRR required this visit?  No   CCM required this visit?  No      Plan:     I have personally reviewed and noted the following in the patient's chart:   . Medical and social history . Use of  alcohol, tobacco or illicit drugs  . Current medications and supplements . Functional ability and status . Nutritional status . Physical activity . Advanced directives . List of other physicians . Hospitalizations, surgeries, and ER visits in previous 12 months . Vitals . Screenings to include cognitive, depression, and falls . Referrals and appointments  In addition, I have reviewed and discussed with patient certain preventive protocols, quality metrics, and best practice recommendations. A written personalized care plan for preventive services as well as general preventive health recommendations were provided to patient.     [name], LPN   0/0/1809   Nurse Notes: pt states she is doing much better with blood sugars and has been able to receive freestyle monitor and insulin pump for DM management.

## 2020-02-29 ENCOUNTER — Encounter: Payer: Self-pay | Admitting: Family Medicine

## 2020-02-29 ENCOUNTER — Ambulatory Visit (INDEPENDENT_AMBULATORY_CARE_PROVIDER_SITE_OTHER): Payer: Medicare Other | Admitting: Endocrinology

## 2020-02-29 ENCOUNTER — Ambulatory Visit: Payer: Medicare Other | Admitting: Endocrinology

## 2020-02-29 ENCOUNTER — Other Ambulatory Visit: Payer: Self-pay

## 2020-02-29 VITALS — BP 140/80 | HR 87 | Ht 65.0 in | Wt 257.8 lb

## 2020-02-29 DIAGNOSIS — Z794 Long term (current) use of insulin: Secondary | ICD-10-CM

## 2020-02-29 DIAGNOSIS — E1165 Type 2 diabetes mellitus with hyperglycemia: Secondary | ICD-10-CM

## 2020-02-29 MED ORDER — TRULICITY 3 MG/0.5ML ~~LOC~~ SOAJ: 3.0000 mg | SUBCUTANEOUS | 2 refills | Status: DC

## 2020-02-29 NOTE — Patient Instructions (Addendum)
Take 2 shots of 1.5 Trulicity weekly  Take 2 Farxiga pills in am then 26m daily  Basal 12am to 6 am will be 2.0  Check blood sugars on waking up    Also check blood sugars about 2 hours after meals and do this after different meals by rotation  Recommended blood sugar levels on waking up are 90-130 and about 2 hours after meal is 130-160  Please bring your blood sugar monitor to each visit, thank you KJoellen Jersey

## 2020-02-29 NOTE — Progress Notes (Signed)
Patient ID: [name], female   DOB: January 25, 1965, 55 y.o.   MRN: [id number]          Reason for Appointment: Follow-up for Type 2 Diabetes   History of Present Illness:          Date of diagnosis of type 2 diabetes mellitus:   1989      Background history:   She thinks she was initially treated with diet alone and subsequently given metformin With metformin she had nausea and diarrhea but she does not know if she was treated with any other medications until she was started on insulin in 2003.  Also was on glipizide in 2015 Most likely she was on Levemir or Lantus insulin for quite some time She was then switched to Toujeo in 2016 and although she was initially prescribed 90 units twice a day this was subsequently lowered  Her A1c has been occasionally between 7.2 and 7.5 but has been consistently over 9% since 2018  Recent history:   INSULIN regimen previously: 80 U Toujeo hs, Novolog 20-25 units before meals based on blood sugar      OMNIPOD PUMP settings: Carbohydrate coverage 1:1 ratio,  Basal settings: 12:00 AM (6 hr)1.8 Units/hr 6:00 AM (6 hr)1.9 Units/hr 12:00 PM (6 hr)2.1 Units/hr 6:00 PM (6 hr)1.9 Units/hr  correction 1: 40 and target 120-140  Non-insulin hypoglycemic drugs the patient is taking are: Trulicity 1.5 mg weekly, Farxiga 5 mg daily  Her A1c is 9.1 and previous range 8.9-12.6  Current management, blood sugar patterns and problems identified:  She is not using an Accu-Chek meter but her freestyle libre recently, most of her readings are in the pump  Unable to download her freestyle Elenor Legato because of her not accessing the login  She started the OMNIPOD pump in early January  Her basal rate was recently increased to two-point 1 in the afternoon   She was having initially very good readings on her starting the pump but now they are progressively higher  She thinks her blood sugars are higher because of not watching her diet consistently  She  also has gained weight  Currently her blood sugars are somewhat better midmorning and late afternoon  However most of her fasting readings generally are in the morning or high as well as some readings later in the evening  She has been regular with taking her Trulicity and [place]   Renal function checked normal with starting Farxiga 5 mg  She has tried to do a little walking outside using her cane        Side effects from medications have been:metformin caused GI side effects  Glucose monitoring:  done 3+ times a day              Blood Glucose readings recently by time of day and 14-day averages from pump download    PRE-MEAL Fasting Lunch Dinner Bedtime Overall  Glucose range:  185-247  159-223  138-223    Mean/median:   188  165  208  177   POST-MEAL PC Breakfast PC Lunch PC Dinner  Glucose range:  175-241   170, 245  Mean/median:       Meal times are: Bfst 7 AM, 1-2 PM and 5-6 PM                Dietician visit, most recent: 9/18 CDE visit: 01/2020 for pump start  Weight history: Previous range 219-230  Wt Readings from Last 3 Encounters:  02/29/20 257  lb 12.8 oz (116.9 kg)  01/30/20 254 lb 3.2 oz (115.3 kg)  01/25/20 240 lb (108.9 kg)    Glycemic control:    Lab Results  Component Value Date   HGBA1C 9.1 (H) 01/30/2020   HGBA1C 9.4 (A) 09/14/2019   HGBA1C 8.9 (H) 04/28/2019   Lab Results  Component Value Date   MICROALBUR 20.3 (H) 09/21/2019   LDLCALC 92 04/28/2019   CREATININE 0.78 01/30/2020   Lab Results  Component Value Date   MICRALBCREAT 21.8 09/21/2019    Lab Results  Component Value Date   FRUCTOSAMINE 298 (H) 01/30/2020   FRUCTOSAMINE CANCELED 07/26/2015   FRUCTOSAMINE 264 07/26/2015      Allergies as of 02/29/2020      Reactions   Codeine Swelling, Other (See Comments)   Swelling and burning of mouth (inside)   Lactose Intolerance (gi) Nausea And Vomiting      Medication List       Accurate as of February 29, 2020  1:39 PM.  If you have any questions, ask your nurse or doctor.        Accu-Chek Softclix Lancets lancets   albuterol 108 (90 Base) MCG/ACT inhaler Commonly known as: ProAir HFA INHALE 2 PUFFS BY MOUTH EVERY 6 HOURS AS NEEDED FOR WHEEZING OR SHORTNESS OF BREATH   albuterol 1.25 MG/3ML nebulizer solution Commonly known as: ACCUNEB Inhale into the lungs. Use 3 mls by nebulization every 6 hours as needed for wheezing   baclofen 10 MG tablet Commonly known as: LIORESAL Take 1 tablet (10 mg total) by mouth 3 (three) times daily.   budesonide 0.5 MG/2ML nebulizer solution Commonly known as: PULMICORT Inhale 0.5 mg into the lungs daily. Take 2 mls by nebulizations once daily   CALCIUM-VITAMIN D3 PO D3 500- calcium 630 mg Take 1 capsule by mouth once daily   carvedilol 12.5 MG tablet Commonly known as: COREG TAKE 1 TABLET (12.5 MG TOTAL) BY MOUTH 2 (TWO) TIMES DAILY AFTER A MEAL.   clopidogrel 75 MG tablet Commonly known as: PLAVIX Take 1 tablet (75 mg total) by mouth daily.   cycloSPORINE 0.05 % ophthalmic emulsion Commonly known as: RESTASIS Place 2 drops into both eyes 2 (two) times daily.   dapagliflozin propanediol 10 MG Tabs tablet Commonly known as: Farxiga Take 1 tablet (10 mg total) by mouth daily before breakfast.   DULoxetine 20 MG capsule Commonly known as: Cymbalta Take 2 capsules q am and 1 capsule q pm   Dupixent 300 MG/2ML Sopn Generic drug: Dupilumab Inject 300 mg into the skin every 14 (fourteen) days.   EPINEPHrine 0.3 mg/0.3 mL Soaj injection Commonly known as: EPI-PEN Inject 0.3 mg into the muscle as needed for anaphylaxis.   ezetimibe 10 MG tablet Commonly known as: Zetia Take 1 tablet (10 mg total) by mouth daily.   fluticasone 50 MCG/ACT nasal spray Commonly known as: FLONASE Place 2 sprays into both nostrils daily.   FreeStyle Libre 2 Sensor Misc Use 1 sensor every 14 days   gabapentin 600 MG tablet Commonly known as: Neurontin 600 mg in the  morning, 600 mg in the afternoon, 1200 mg nightly.   insulin lispro 100 UNIT/ML injection Commonly known as: HumaLOG Use up to 150 units/day in insulin pump   levETIRAcetam 500 MG tablet Commonly known as: KEPPRA Take 500 mg by mouth 2 (two) times daily. 1 TAB IN THE MORNING AND .5 TABS AT NIGHT   losartan 100 MG tablet Commonly known as: COZAAR Take 1 tablet (100  mg total) by mouth daily.   mirabegron ER 50 MG Tb24 tablet Commonly known as: Myrbetriq Take 1 tablet (50 mg total) by mouth daily.   nitroGLYCERIN 0.4 MG SL tablet Commonly known as: NITROSTAT   OmniPod Dash System Kit by Does not apply route.   pantoprazole 40 MG tablet Commonly known as: PROTONIX Take 1 tablet (40 mg total) by mouth daily.   polyethylene glycol 17 g packet Commonly known as: MIRALAX / GLYCOLAX Take 17 g by mouth daily as needed for mild constipation.   potassium chloride SA 20 MEQ tablet Commonly known as: Klor-Con M20 You have not needed potassium while in the hospital.  Hold until outpatient followup.   Precision QID Test test strip Generic drug: glucose blood Use 1 each (1 strip total) 4 (four) times daily Use as instructed.   simvastatin 40 MG tablet Commonly known as: ZOCOR Take 40 mg by mouth daily.   spironolactone 25 MG tablet Commonly known as: ALDACTONE Take 1 tablet by mouth daily.   terconazole 0.4 % vaginal cream Commonly known as: TERAZOL 7 Place 1 applicator vaginally at bedtime as needed (irritation).   Wixela Inhub 250-50 MCG/DOSE Aepb Generic drug: Fluticasone-Salmeterol Inhale 1 puff into the lungs 2 (two) times daily.       Allergies:  Allergies  Allergen Reactions  . Codeine Swelling and Other (See Comments)    Swelling and burning of mouth (inside)  . Lactose Intolerance (Gi) Nausea And Vomiting    Past Medical History:  Diagnosis Date  . Anxiety and depression 09/13/2006   Qualifier: Diagnosis of  By: Hassell Done [name], Tori Milks    . Arthritis     joint pain   . Asthma   . COPD (chronic obstructive pulmonary disease) (HCC)    chronic bronchitis   . Depression   . Diabetes mellitus    since age 26; type 2 IDDM  . Diabetic neuropathy, painful ([place])    FEET AND HANDS  . Diabetic retinopathy ([place])   . Diastolic dysfunction    a.  Echo 11/17: EF 60-65%, mild LVH, no RWMA, Gr1DD, mild AI, dilated aortic root measuring 38 mm, mildly dilated ascending aorta  . Dilated aortic root ([place])    28m by echo 11/2015  . Dizziness    secondary to diabetes and hypertension   . Epilepsy idiopathic petit mal ([facility]    last seizure 2012;controlled w/ topomax  . GERD (gastroesophageal reflux disease)   . Glaucoma    NOT ON ANY EYE DROPS   . Headache(784.0)    migraines   . Heart murmur    born with   . History of stress test    a. 11/17: Normal perfusion, EF 53%, normal study  . Hx of blood clots    hematomas removed from left side of brain from [id number] [id number]   . Hyperlipidemia   . Hypertension   . Legally blind    left eye   . MIGRAINE HEADACHE 09/14/2006   Qualifier: Diagnosis of  By: MarHassell DoneP, NykTori Milks . Myocardial infarction ([facility] [place]  a.  Patient reported history of without objective documentation  . Nephrolithiasis    frequent urination , urination at nite  PT SEEN IN ER 09/22/11 FOR BACK AND RT SIDED PAIN--HAS KNOWN STONE RT URETER AND UA IN ER SHOWED UTI  . Pain 09/23/11   LOWER BACK AND RIGHT SIDE--PT HAS RIGHT URETERAL STONE  . Pneumonia    hx of 2009  .  Pyelonephritis   . Seizures ([place])   . Sleep ap[facility]    [facility] ; mild  . Stress incontinence   . Stroke [facility])    last 2003  RESIDUAL LEFT LEG WEAKNESS--NO OTHER RESIDUAL PROBLEMS    Past Surgical History:  Procedure Laterality Date  . BRAIN HEMATOMA EVACUATION     five procedures total, first procedure when 42 months old, last at 55 years of age.  . CYSTOSCOPY W/ RETROGRADES  09/24/2011   Procedure: CYSTOSCOPY WITH RETROGRADE  PYELOGRAM;  Surgeon: Malka So, MD;  Location: [facility];  Service: Urology;  Laterality: Right;  . CYSTOSCOPY WITH URETEROSCOPY  09/24/2011   Procedure: CYSTOSCOPY WITH URETEROSCOPY;  Surgeon: Malka So, MD;  Location: [facility];  Service: Urology;  Laterality: Right;  Balloon dilation right ureter   . CYSTOSCOPY/RETROGRADE/URETEROSCOPY  08/24/2011   Procedure: CYSTOSCOPY/RETROGRADE/URETEROSCOPY;  Surgeon: Molli Hazard, MD;  Location: [facility];  Service: Urology;  Laterality: Right;  Cysto, Right retrograde Pyelogram, right stent placement.   Fatima Blank HERNIA REPAIR  05/05/2011   Procedure: LAPAROSCOPIC INCISIONAL HERNIA;  Surgeon: Rolm Bookbinder, MD;  Location: [place];  Service: General;  Laterality: N/A;  . kidney stone removal    . MASS EXCISION  05/05/2011   Procedure: EXCISION MASS;  Surgeon: Rolm Bookbinder, MD;  Location: [place];  Service: General;  Laterality: Right;  . PCNL    . RETINAL DETACHMENT SURGERY Left 1990  . RIGHT/LEFT HEART CATH AND CORONARY ANGIOGRAPHY N/A 04/28/2019   Procedure: RIGHT/LEFT HEART CATH AND CORONARY ANGIOGRAPHY;  Surgeon: Yolonda Kida, MD;  Location: [place] [facility];  Service: Cardiovascular;  Laterality: N/A;  . SHUNT REMOVAL     shunt inserted at age 33 removed at age 86   . VAGINAL HYSTERECTOMY  1996    Family History  Problem Relation Age of Onset  . Uterine cancer Mother   . Hypertension Mother   . Cancer Mother   . Brain cancer Maternal Grandmother   . Hypertension Maternal Grandmother   . Birth defects Daughter   . Hypertension Daughter   . Cirrhosis Maternal Grandfather   . ADD / ADHD Maternal Grandfather   . Birth defects Maternal Grandfather   . Diabetes Maternal Grandfather   . Anesthesia problems Neg Hx   . Colon cancer Neg Hx   . Esophageal cancer Neg Hx   . Pancreatic cancer Neg Hx     Social History:  reports that she quit smoking about 33 years ago. Her smoking use included cigarettes. She has a 15.00 pack-year  smoking history. She has never used smokeless tobacco. She reports that she does not drink alcohol and does not use drugs.   Review of Systems   Lipid history: Is being treated with simvastatin 40 mg daily    Lab Results  Component Value Date   CHOL 155 04/28/2019   HDL 43 04/28/2019   LDLCALC 92 04/28/2019   TRIG 102 04/28/2019   CHOLHDL 3.6 04/28/2019           Hypertension: Taking Coreg, Losartan 100 mg  BP Readings from Last 3 Encounters:  02/29/20 140/80  01/31/20 128/90  01/30/20 140/90   Hypokalemia: She has been given 40 mEq of potassium along with 25 mg spironolactone for treatment by PCP  Lab Results  Component Value Date   K 3.8 01/30/2020    Most recent eye exam was in 8/21  Most recent foot exam: 9/20  Diabetic  complications: History of neuropathy, microalbuminuria and background retinopathy  She will occasionally take prednisone for her asthma  LABS:  No visits with results within 1 Week(s) from this visit.  Latest known visit with results is:  Office Visit on 01/30/2020  Component Date Value Ref Range Status  . Sodium 01/30/2020 138  135 - 145 mEq/L Final  . Potassium 01/30/2020 3.8  3.5 - 5.1 mEq/L Final  . Chloride 01/30/2020 104  96 - 112 mEq/L Final  . CO2 01/30/2020 32  19 - 32 mEq/L Final  . Glucose, Bld 01/30/2020 91  70 - 99 mg/dL Final  . BUN 01/30/2020 14  6 - 23 mg/dL Final  . Creatinine, Ser 01/30/2020 0.78  0.40 - 1.20 mg/dL Final  . Total Bilirubin 01/30/2020 0.4  0.2 - 1.2 mg/dL Final  . Alkaline Phosphatase 01/30/2020 71  39 - 117 U/L Final  . AST 01/30/2020 15  0 - 37 U/L Final  . ALT 01/30/2020 16  0 - 35 U/L Final  . Total Protein 01/30/2020 7.4  6.0 - 8.3 g/dL Final  . Albumin 01/30/2020 4.0  3.5 - 5.2 g/dL Final  . GFR 01/30/2020 85.55  >60.00 mL/min Final   Calculated using the CKD-EPI Creatinine Equation (2021)  . Calcium 01/30/2020 9.4  8.4 - 10.5 mg/dL Final  . Hgb A1c MFr Bld 01/30/2020 9.1* 4.6 - 6.5 % Final    Glycemic Control Guidelines for People with Diabetes:Non Diabetic:  <6%Goal of Therapy: <7%Additional Action Suggested:  >8%   . Fructosamine 01/30/2020 298* 0 - 285 umol/L Final   Comment: Published reference interval for apparently healthy subjects between age 37 and 26 is 41 - 285 umol/L and in a poorly controlled diabetic population is 228 - 563 umol/L with a mean of 396 umol/L.     Physical Examination:  BP 140/80 (BP Location: Right Arm, Patient Position: Sitting, Cuff Size: Large)   Pulse 87   Ht 5' 5" (1.651 m)   Wt 257 lb 12.8 oz (116.9 kg)   LMP  (LMP Unknown)   SpO2 97%   BMI 42.90 kg/m       ASSESSMENT:  Diabetes type 2, uncontrolled, insulin-dependent  See history of present illness for detailed discussion of current diabetes management, blood sugar patterns and problems identified     Her A1c is 9.1  She is on insulin pump since 1/22 using Humalog and her OmniPod pump Her insulin pump was downloaded with data from her freestyle Elenor Legato was not available today  Blood sugar averages are about 170-180 throughout the day now with some variability However for some reason her blood sugars were much better yesterday except on waking Also has high fasting readings fairly consistently   PLAN:   Basal rate 2.0 instead of 1.8 from 12 AM-6 AM She thinks she can do the changes herself She will try to cut back on portion calories to help with weight loss She will call patient customer-service to help set up her freestyle libre account and link with this Encouraged her to walk as much as possible  Try Trulicity 3 mg weekly to see if this will help her with more satiety and some weight loss, she will double up on what she has at home and let us know if she has any nausea with this  Increase Farxiga to 10 mg  Follow-up in 6 weeks or so There are no Patient Instructions on file for this visit.    Elayne Snare 02/29/2020, 1:39 PM  Note: This office note was prepared  with Estate agent. Any transcriptional errors that result from this process are unintentional.

## 2020-03-01 ENCOUNTER — Other Ambulatory Visit: Payer: Self-pay | Admitting: Family Medicine

## 2020-03-01 ENCOUNTER — Encounter: Payer: Self-pay | Admitting: Family Medicine

## 2020-03-01 NOTE — Telephone Encounter (Signed)
Medication Refill - Medication: terconazole (TERAZOL 7) 0.4 % vaginal cream     Preferred Pharmacy (with phone number or street name):  [id number] [place]:  [phone] Fax:  [phone]      Agent: Please be advised that RX refills may take up to 3 business days. We ask that you follow-up with your pharmacy.

## 2020-03-01 NOTE — Telephone Encounter (Signed)
}    Notes to clinic: Patient requesting medication that was last filled by a historical provider Review for use and refill   Requested Prescriptions  Pending Prescriptions Disp Refills   terconazole (TERAZOL 7) 0.4 % vaginal cream 45 g 0    Sig: Place 1 applicator vaginally at bedtime as needed (irritation).      Off-Protocol Failed - 03/01/2020 10:30 AM      Failed - Medication not assigned to a protocol, review manually.      Passed - Valid encounter within last 12 months    Recent Outpatient Visits           2 months ago PAD (peripheral artery disease) [facility])   [facility] Delsa Grana, [name]   5 months ago Encounter for screening mammogram for malignant neoplasm of breast   [place], MD   8 months ago No-show for appointment   [facility] Delsa Grana, [name]   9 months ago Essential hypertension   [place], [name]   10 months ago Other chest pain   [place], MD       Future Appointments             In [facility] months [facility], [facility]

## 2020-03-04 ENCOUNTER — Other Ambulatory Visit: Payer: Self-pay

## 2020-03-05 ENCOUNTER — Other Ambulatory Visit: Payer: Self-pay | Admitting: Family Medicine

## 2020-03-05 DIAGNOSIS — E1142 Type 2 diabetes mellitus with diabetic polyneuropathy: Secondary | ICD-10-CM

## 2020-03-05 DIAGNOSIS — M791 Myalgia, unspecified site: Secondary | ICD-10-CM

## 2020-03-05 NOTE — Telephone Encounter (Signed)
   Notes to clinic: review medication for refill  Last filled by Dr. Roxan Hockey     Requested Prescriptions  Pending Prescriptions Disp Refills   DULoxetine (CYMBALTA) 20 MG capsule 90 capsule 2    Sig: Take 2 capsules q am and 1 capsule q pm      Psychiatry: Antidepressants - SNRI Failed - 03/05/2020  9:47 AM      Failed - Last BP in normal range    BP Readings from Last 1 Encounters:  02/29/20 140/80          Passed - Valid encounter within last 6 months    Recent Outpatient Visits           2 months ago PAD (peripheral artery disease) [facility])   [facility] Delsa Grana, [name]   5 months ago Encounter for screening mammogram for malignant neoplasm of breast   [facility] [place], MD   9 months ago No-show for appointment   [facility] Delsa Grana, [name]   9 months ago Essential hypertension   [place], [name]   10 months ago Other chest pain   [place], MD       Future Appointments             In 31 months [facility], [facility]

## 2020-03-05 NOTE — Telephone Encounter (Signed)
Copied from [place] [phone]. Topic: Quick Communication - Rx Refill/Question >> Mar 05, 2020  9:38 AM Yvette Rack wrote: Medication: DULoxetine (CYMBALTA) 20 MG capsule  Has the patient contacted their pharmacy? yes   Preferred Pharmacy (with phone number [facility] street name[id number] [place]: [phone]  Fax: [phone] Agent: Please be advised that RX refills may take up to 3 business days. We ask that you follow-up with your pharmacy.

## 2020-03-06 MED ORDER — DULOXETINE HCL 20 MG PO CPEP: ORAL_CAPSULE | ORAL | 1 refills | Status: DC

## 2020-03-06 MED ORDER — TERCONAZOLE 0.4 % VA CREA: 1.0000 | TOPICAL_CREAM | Freq: Every evening | VAGINAL | 0 refills | Status: DC | PRN

## 2020-03-06 NOTE — Telephone Encounter (Signed)
Patient is calling today for follow up on her medications- she is upset because she has been waiting for 1 week for response. Call to office and they assure patient that her message will get a response today.Patient notified and she will await response today.

## 2020-03-11 ENCOUNTER — Other Ambulatory Visit: Payer: Self-pay | Admitting: Endocrinology

## 2020-03-28 ENCOUNTER — Other Ambulatory Visit: Payer: Self-pay | Admitting: Family Medicine

## 2020-03-28 DIAGNOSIS — M791 Myalgia, unspecified site: Secondary | ICD-10-CM

## 2020-03-28 DIAGNOSIS — E1142 Type 2 diabetes mellitus with diabetic polyneuropathy: Secondary | ICD-10-CM

## 2020-03-31 ENCOUNTER — Other Ambulatory Visit: Payer: Self-pay | Admitting: Family Medicine

## 2020-03-31 DIAGNOSIS — E1142 Type 2 diabetes mellitus with diabetic polyneuropathy: Secondary | ICD-10-CM

## 2020-03-31 DIAGNOSIS — M791 Myalgia, unspecified site: Secondary | ICD-10-CM

## 2020-04-09 ENCOUNTER — Ambulatory Visit (INDEPENDENT_AMBULATORY_CARE_PROVIDER_SITE_OTHER): Payer: Medicare Other | Admitting: Endocrinology

## 2020-04-09 ENCOUNTER — Other Ambulatory Visit: Payer: Self-pay

## 2020-04-09 ENCOUNTER — Encounter: Payer: Self-pay | Admitting: Endocrinology

## 2020-04-09 VITALS — BP 152/88 | HR 82 | Resp 20 | Ht 67.0 in | Wt 258.0 lb

## 2020-04-09 DIAGNOSIS — E782 Mixed hyperlipidemia: Secondary | ICD-10-CM | POA: Diagnosis not present

## 2020-04-09 DIAGNOSIS — Z794 Long term (current) use of insulin: Secondary | ICD-10-CM

## 2020-04-09 DIAGNOSIS — E1165 Type 2 diabetes mellitus with hyperglycemia: Secondary | ICD-10-CM | POA: Diagnosis not present

## 2020-04-09 DIAGNOSIS — E876 Hypokalemia: Secondary | ICD-10-CM | POA: Diagnosis not present

## 2020-04-09 LAB — BASIC METABOLIC PANEL
BUN: 14 mg/dL (ref 6–23)
CO2: 31 mEq/L (ref 19–32)
Calcium: 9.7 mg/dL (ref 8.4–10.5)
Chloride: 102 mEq/L (ref 96–112)
Creatinine, Ser: 0.8 mg/dL (ref 0.40–1.20)
GFR: 82.87 mL/min (ref >60.00)
Glucose, Bld: 200 mg/dL — ABNORMAL HIGH (ref 70–99)
Potassium: 4 mEq/L (ref 3.5–5.1)
Sodium: 139 mEq/L (ref 135–145)

## 2020-04-09 NOTE — Progress Notes (Signed)
Patient ID: [name], female   DOB: April 14, 1965, 55 y.o.   MRN: [id number]          Reason for Appointment: Follow-up for Type 2 Diabetes   History of Present Illness:          Date of diagnosis of type 2 diabetes mellitus:   1989      Background history:   She thinks she was initially treated with diet alone and subsequently given metformin With metformin she had nausea and diarrhea but she does not know if she was treated with any other medications until she was started on insulin in 2003.  Also was on glipizide in 2015 Most likely she was on Levemir or Lantus insulin for quite some time She was then switched to Toujeo in 2016 and although she was initially prescribed 90 units twice a day this was subsequently lowered  Her A1c has been occasionally between 7.2 and 7.5 but has been consistently over 9% since 2018  Recent history:   INSULIN regimen previously: 80 U Toujeo hs, Novolog 20-25 units before meals based on blood sugar      OMNIPOD PUMP settings: Carbohydrate coverage 1:1 ratio,   Basal settings: 12:00 AM (6 hr) 2.1 Units/hr 6:00 AM (6 hr)1.9 Units/hr 12:00 PM (6 hr)2.1 Units/hr 6:00 PM (6 hr)1.9 Units/hr  correction 1: 40 and target 120-140  Non-insulin hypoglycemic drugs the patient is taking are: Trulicity 1.5 mg weekly, Farxiga 5 mg daily  Her A1c is 9.1 and previous range 8.9-12.6  Current management, blood sugar patterns and problems identified:  She has only a few blood sugars after dinner pump  Again again unable to download her freestyle libre because of her not accessing the login  Today her blood sugar is about 35 mg lower on the freestyle libre compared to the fingerstick in the office  Also appears that she is likely missing quite a number of boluses  This is most likely at lunch and she will sometimes eat a sandwich  Only has boluses at suppertime and rarely at breakfast time when she is eating a breakfast bar sometimes  She is  under the impression that she cannot bolus if her blood sugars are near normal for fear of hypoglycemia  Although she has better satiety with TRULICITY 3 mg dosage she has not lost any weight  Also not clear if her blood sugars are better with 10 mg Farxiga instead of 5 mg since her visit in 2/22  Review of her blood sugars from her phone on her freestyle libre show the following  Average for the last 2 weeks = 146 and over 80% within target range  Lowest average sugar at 3 AM = 129 AM HIGHEST at 9 PM = 162  No hypoglycemia documented  Usually blood sugars appear to be coming down overnight and variably high during the day, generally higher late evening also         Side effects from medications have been:metformin caused GI side effects  Glucose monitoring:  done 3+ times a day              Blood Glucose readings recently by time of day and 14-day averages from pump download    PRE-MEAL Fasting Lunch Dinner Bedtime Overall  Glucose range:  160-173  196-245  161-240  186, 247   Mean/median:   210  180   186   Previously:  PRE-MEAL Fasting Lunch Dinner Bedtime Overall  Glucose range:  185-247  409-811  138-223    Mean/median:   188  165  208  177   POST-MEAL PC Breakfast PC Lunch PC Dinner  Glucose range:  175-241   170, 245  Mean/median:       Meal times are: Bfst 7 AM, 1-2 PM and 5-6 PM                Dietician visit, most recent: 9/18 CDE visit: 01/2020 for pump start  Weight history: Previous range 219-230  Wt Readings from Last 3 Encounters:  04/09/20 258 lb (117 kg)  02/29/20 257 lb 12.8 oz (116.9 kg)  01/30/20 254 lb 3.2 oz (115.3 kg)    Glycemic control:    Lab Results  Component Value Date   HGBA1C 9.1 (H) 01/30/2020   HGBA1C 9.4 (A) 09/14/2019   HGBA1C 8.9 (H) 04/28/2019   Lab Results  Component Value Date   MICROALBUR 20.3 (H) 09/21/2019   LDLCALC 92 04/28/2019   CREATININE 0.78 01/30/2020   Lab Results  Component Value Date    MICRALBCREAT 21.8 09/21/2019    Lab Results  Component Value Date   FRUCTOSAMINE 298 (H) 01/30/2020   FRUCTOSAMINE CANCELED 07/26/2015   FRUCTOSAMINE 264 07/26/2015      Allergies as of 04/09/2020      Reactions   Codeine Swelling, Other (See Comments)   Swelling and burning of mouth (inside)   Lactose Intolerance (gi) Nausea And Vomiting      Medication List       Accurate as of April 09, 2020  1:09 PM. If you have any questions, ask your nurse or doctor.        Accu-Chek Softclix Lancets lancets   albuterol 108 (90 Base) MCG/ACT inhaler Commonly known as: ProAir HFA INHALE 2 PUFFS BY MOUTH EVERY 6 HOURS AS NEEDED FOR WHEEZING OR SHORTNESS OF BREATH   albuterol 1.25 MG/3ML nebulizer solution Commonly known as: ACCUNEB Inhale into the lungs. Use 3 mls by nebulization every 6 hours as needed for wheezing   baclofen 10 MG tablet Commonly known as: LIORESAL Take 1 tablet (10 mg total) by mouth 3 (three) times daily.   budesonide 0.5 MG/2ML nebulizer solution Commonly known as: PULMICORT Inhale 0.5 mg into the lungs daily. Take 2 mls by nebulizations once daily   CALCIUM-VITAMIN D3 PO D3 500- calcium 630 mg Take 1 capsule by mouth once daily   carvedilol 12.5 MG tablet Commonly known as: COREG TAKE 1 TABLET (12.5 MG TOTAL) BY MOUTH 2 (TWO) TIMES DAILY AFTER A MEAL.   clopidogrel 75 MG tablet Commonly known as: PLAVIX Take 1 tablet (75 mg total) by mouth daily.   cycloSPORINE 0.05 % ophthalmic emulsion Commonly known as: RESTASIS Place 2 drops into both eyes 2 (two) times daily.   dapagliflozin propanediol 10 MG Tabs tablet Commonly known as: Farxiga Take 1 tablet (10 mg total) by mouth daily before breakfast. What changed: Another medication with the same name was removed. Continue taking this medication, and follow the directions you see here. Changed by: Elayne Snare, MD   DULoxetine 20 MG capsule Commonly known as: CYMBALTA TAKE 2 CAPSULES EVERY MORNING  AND 1 CAPSULE EVERY NIGHT.   Dupixent 300 MG/2ML Sopn Generic drug: Dupilumab Inject 300 mg into the skin every 14 (fourteen) days.   EPINEPHrine 0.3 mg/0.3 mL Soaj injection Commonly known as: EPI-PEN Inject 0.3 mg into the muscle as needed for anaphylaxis.   ezetimibe 10 MG tablet Commonly known as: Zetia Take 1 tablet (10  mg total) by mouth daily.   fluticasone 50 MCG/ACT nasal spray Commonly known as: FLONASE Place 2 sprays into both nostrils daily.   FreeStyle Libre 2 Sensor Misc Use 1 sensor every 14 days   gabapentin 600 MG tablet Commonly known as: Neurontin 600 mg in the morning, 600 mg in the afternoon, 1200 mg nightly.   insulin lispro 100 UNIT/ML injection Commonly known as: HumaLOG Use up to 150 units/day in insulin pump   levETIRAcetam 500 MG tablet Commonly known as: KEPPRA Take 500 mg by mouth 2 (two) times daily. 1 TAB IN THE MORNING AND .5 TABS AT NIGHT   losartan 100 MG tablet Commonly known as: COZAAR Take 1 tablet (100 mg total) by mouth daily.   mirabegron ER 50 MG Tb24 tablet Commonly known as: Myrbetriq Take 1 tablet (50 mg total) by mouth daily.   nitroGLYCERIN 0.4 MG SL tablet Commonly known as: NITROSTAT   OmniPod Dash System Kit by Does not apply route.   pantoprazole 40 MG tablet Commonly known as: PROTONIX Take 1 tablet (40 mg total) by mouth daily.   polyethylene glycol 17 g packet Commonly known as: MIRALAX / GLYCOLAX Take 17 g by mouth daily as needed for mild constipation.   potassium chloride SA 20 MEQ tablet Commonly known as: Klor-Con M20 You have not needed potassium while in the hospital.  Hold until outpatient followup.   Precision QID Test test strip Generic drug: glucose blood Use 1 each (1 strip total) 4 (four) times daily Use as instructed.   simvastatin 40 MG tablet Commonly known as: ZOCOR Take 40 mg by mouth daily.   spironolactone 25 MG tablet Commonly known as: ALDACTONE Take 1 tablet by mouth  daily.   terconazole 0.4 % vaginal cream Commonly known as: TERAZOL 7 Place 1 applicator vaginally at bedtime as needed (irritation).   Trulicity 3 JT/7.0VX Sopn Generic drug: Dulaglutide Inject 3 mg into the skin once a week.   Wixela Inhub 250-50 MCG/DOSE Aepb Generic drug: Fluticasone-Salmeterol Inhale 1 puff into the lungs 2 (two) times daily.       Allergies:  Allergies  Allergen Reactions  . Codeine Swelling and Other (See Comments)    Swelling and burning of mouth (inside)  . Lactose Intolerance (Gi) Nausea And Vomiting    Past Medical History:  Diagnosis Date  . Anxiety and depression 09/13/2006   Qualifier: Diagnosis of  By: Hassell Done [name], Tori Milks    . Arthritis    joint pain   . Asthma   . COPD (chronic obstructive pulmonary disease) (HCC)    chronic bronchitis   . Depression   . Diabetes mellitus    since age 50; type 2 IDDM  . Diabetic neuropathy, painful ([place])    FEET AND HANDS  . Diabetic retinopathy ([place])   . Diastolic dysfunction    a.  Echo 11/17: EF 60-65%, mild LVH, no RWMA, Gr1DD, mild AI, dilated aortic root measuring 38 mm, mildly dilated ascending aorta  . Dilated aortic root ([place])    2m by echo 11/2015  . Dizziness    secondary to diabetes and hypertension   . Epilepsy idiopathic petit mal ([place]    last seizure 2012;controlled w/ topomax  . GERD (gastroesophageal reflux disease)   . Glaucoma    NOT ON ANY EYE DROPS   . Headache(784.0)    migraines   . Heart murmur    born with   . History of stress test    a. 11/17: Normal  perfusion, EF 53%, normal study  . Hx of blood clots    hematomas removed from left side of brain from 37mo to 89yrold   . Hyperlipidemia   . Hypertension   . Legally blind    left eye   . MIGRAINE HEADACHE 09/14/2006   Qualifier: Diagnosis of  By: MaHassell DoneNP, NyTori Milks  . Myocardial infarction ([place] [place]   a.  Patient reported history of without objective documentation  . Nephrolithiasis    frequent urination  , urination at nite  PT SEEN IN ER 09/22/11 FOR BACK AND RT SIDED PAIN--HAS KNOWN STONE RT URETER AND UA IN ER SHOWED UTI  . Pain 09/23/11   LOWER BACK AND RIGHT SIDE--PT HAS RIGHT URETERAL STONE  . Pneumonia    hx of 2009  . Pyelonephritis   . Seizures ([facility]  . Sleep apnea    [facility] ; mild  . Stress incontinence   . [facility]   last 2003  RESIDUAL LEFT LEG WEAKNESS--NO OTHER RESIDUAL PROBLEMS    Past Surgical History:  Procedure Laterality Date  . BRAIN HEMATOMA EVACUATION     five procedures total, first procedure when [age] monthsld, last at [age]ears of age.  . CYSTOSCOPY W/ RETROGRADES  09/24/2011   Procedure: CYSTOSCOPY WITH RETROGRADE PYELOGRAM;  Surgeon: JoMalka SoMD;  Location: [facility];  Service: Urology;  Laterality: Right;  . CYSTOSCOPY WITH URETEROSCOPY  09/24/2011   Procedure: CYSTOSCOPY WITH URETEROSCOPY;  Surgeon: JoMalka SoMD;  Location: [facility];  Service: Urology;  Laterality: Right;  Balloon dilation right ureter   . CYSTOSCOPY/RETROGRADE/URETEROSCOPY  08/24/2011   Procedure: CYSTOSCOPY/RETROGRADE/URETEROSCOPY;  Surgeon: DaMolli HazardMD;  Location: [facility];  Service: Urology;  Laterality: Right;  Cysto, Right retrograde Pyelogram, right stent placement.   . Fatima BlankERNIA REPAIR  05/05/2011   Procedure: LAPAROSCOPIC INCISIONAL HERNIA;  Surgeon: MaRolm BookbinderMD;  [facility] Service: General;  Laterality: N/A;  . kidney stone removal    . MASS EXCISION  05/05/2011   Procedure: EXCISION MASS;  Surgeon: MaRolm BookbinderMD;  [place] [facility] Service: General;  Laterality: Right;  . PCNL    . RETINAL DETACHMENT SURGERY Left 1990  . RIGHT/LEFT HEART CATH AND CORONARY ANGIOGRAPHY N/A 04/28/2019   Procedure: RIGHT/LEFT HEART CATH AND CORONARY ANGIOGRAPHY;  Surgeon: CaYolonda KidaMD;  [facility];  Service: Cardiovascular;  Laterality: N/A;  . SHUNT REMOVAL     shunt inserted at age [age]removed at age 55 . VAGINAL HYSTERECTOMY  1996    Family History  Problem Relation Age of Onset  . Uterine cancer Mother   . Hypertension Mother   . Cancer Mother   . Brain cancer Maternal Grandmother   . Hypertension Maternal Grandmother   . Birth defects Daughter   . Hypertension Daughter   . Cirrhosis Maternal Grandfather   . ADD / ADHD Maternal Grandfather   . Birth defects Maternal Grandfather   . Diabetes Maternal Grandfather   . Anesthesia problems Neg Hx   . Colon cancer Neg Hx   . Esophageal cancer Neg Hx   . Pancreatic cancer Neg Hx     Social History:  reports that she quit smoking about 33 years ago. Her smoking use included cigarettes. She has a 15.00 pack-year smoking history. She has never used smokeless tobacco. She reports that she does not drink  alcohol and does not use drugs.   Review of Systems   Lipid history: Is being treated with simvastatin 40 mg daily    Lab Results  Component Value Date   CHOL 155 04/28/2019   HDL 43 04/28/2019   LDLCALC 92 04/28/2019   TRIG 102 04/28/2019   CHOLHDL 3.6 04/28/2019           Hypertension: Taking Coreg, Losartan 100 mg per cardiologist  BP Readings from Last 3 Encounters:  04/09/20 (!) 152/88  02/29/20 140/80  01/31/20 128/90   Hypokalemia: She has been given 40 mEq of potassium along with 25 mg spironolactone for treatment by PCP  Lab Results  Component Value Date   K 3.8 01/30/2020    Most recent eye exam was in 8/21  Most recent foot exam: 1/60  Diabetic complications: History of neuropathy, microalbuminuria and background retinopathy  She will occasionally take prednisone for her asthma  LABS:  No visits with results within 1 Week(s) from this visit.  Latest known visit with results is:  Office Visit on 01/30/2020  Component Date Value Ref Range Status  . Sodium 01/30/2020 138  135 - 145 mEq/L Final  . Potassium 01/30/2020 3.8  3.5 - 5.1 mEq/L Final  . Chloride 01/30/2020 104  96 -  112 mEq/L Final  . CO2 01/30/2020 32  19 - 32 mEq/L Final  . Glucose, Bld 01/30/2020 91  70 - 99 mg/dL Final  . BUN 01/30/2020 14  6 - 23 mg/dL Final  . Creatinine, Ser 01/30/2020 0.78  0.40 - 1.20 mg/dL Final  . Total Bilirubin 01/30/2020 0.4  0.2 - 1.2 mg/dL Final  . Alkaline Phosphatase 01/30/2020 71  39 - 117 U/L Final  . AST 01/30/2020 15  0 - 37 U/L Final  . ALT 01/30/2020 16  0 - 35 U/L Final  . Total Protein 01/30/2020 7.4  6.0 - 8.3 g/dL Final  . Albumin 01/30/2020 4.0  3.5 - 5.2 g/dL Final  . GFR 01/30/2020 85.55  >60.00 mL/min Final   Calculated using the CKD-EPI Creatinine Equation (2021)  . Calcium 01/30/2020 9.4  8.4 - 10.5 mg/dL Final  . Hgb A1c MFr Bld 01/30/2020 9.1* 4.6 - 6.5 % Final   Glycemic Control Guidelines for People with Diabetes:Non Diabetic:  <6%Goal of Therapy: <7%Additional Action Suggested:  >8%   . Fructosamine 01/30/2020 298* 0 - 285 umol/L Final   Comment: Published reference interval for apparently healthy subjects between age 78 and 38 is 76 - 285 umol/L and in a poorly controlled diabetic population is 228 - 563 umol/L with a mean of 396 umol/L.     Physical Examination:  BP (!) 152/88 (BP Location: Right Arm, Patient Position: Sitting, Cuff Size: Large)   Pulse 82   Resp 20   Ht 5' 7" (1.702 m)   Wt 258 lb (117 kg)   LMP  (LMP Unknown)   SpO2 99%   BMI 40.41 kg/m       ASSESSMENT:  Diabetes type 2, uncontrolled, insulin-dependent  See history of present illness for detailed discussion of current diabetes management, blood sugar patterns and problems identified     Her A1c is last 9.1  She is on insulin pump since 1/22 using Humalog and her OmniPod pump Her insulin pump was downloaded Again data from her freestyle Elenor Legato was not available for download today  Also appears that her freestyle Elenor Legato is reading falsely low today by about 35 mg and she is using the  libre to put in blood sugars in her pump Also is not bolusing when  blood sugars are near normal especially at breakfast and lunch She may have some better readings overnight but likely has high readings at other times especially postprandial Also not appearing to have much improvement with increasing Trulicity and [place]   PLAN:   Discussed need for bolusing consistently with every meal Explained to her today normal spikes after meals and need for a bolus to counteract these No change in Trulicity or [place] She will also try to check her blood sugar with fingerstick to compare with the Elenor Legato We will try to connect her freestyle libre to our online account  Check labs for renal function and potassium today All her basal rates will be increased by 0.2 except at midnight   There are no Patient Instructions on file for this visit.  Total visit time including counseling =30 minutes  Elayne Snare 04/09/2020, 1:09 PM   Note: This office note was prepared with Dragon voice recognition system technology. Any transcriptional errors that result from this process are unintentional.

## 2020-04-09 NOTE — Patient Instructions (Signed)
Bolus for ALL MEALS regardless of sugar

## 2020-04-10 LAB — FRUCTOSAMINE: Fructosamine: 255 umol/L (ref 0–285)

## 2020-04-22 ENCOUNTER — Encounter: Payer: Self-pay | Admitting: Family Medicine

## 2020-04-23 ENCOUNTER — Ambulatory Visit: Payer: Medicare Other | Admitting: Nutrition

## 2020-04-26 ENCOUNTER — Telehealth: Payer: Self-pay | Admitting: Endocrinology

## 2020-04-26 NOTE — Telephone Encounter (Signed)
New message      1. Which medications need to be refilled? (please list name of each medication and dose if known) Insulin Disposable Pump (OMNIPOD DASH SYSTEM) KIT  2. Which pharmacy/location (including street and city if local pharmacy) is medication to be sent to? [id number]

## 2020-04-29 NOTE — Telephone Encounter (Signed)
LOV-04/09/20 Omnipod dash--not on med list. Please advise

## 2020-04-29 NOTE — Telephone Encounter (Signed)
OK to send - she is using it.

## 2020-05-01 ENCOUNTER — Other Ambulatory Visit: Payer: Self-pay

## 2020-05-01 ENCOUNTER — Encounter: Payer: Medicare Other | Attending: Endocrinology | Admitting: Nutrition

## 2020-05-01 DIAGNOSIS — E1129 Type 2 diabetes mellitus with other diabetic kidney complication: Secondary | ICD-10-CM | POA: Diagnosis present

## 2020-05-01 DIAGNOSIS — E1165 Type 2 diabetes mellitus with hyperglycemia: Secondary | ICD-10-CM | POA: Diagnosis present

## 2020-05-01 DIAGNOSIS — IMO0002 Reserved for concepts with insufficient information to code with codable children: Secondary | ICD-10-CM

## 2020-05-07 ENCOUNTER — Other Ambulatory Visit: Payer: Self-pay | Admitting: Endocrinology

## 2020-05-08 ENCOUNTER — Encounter: Payer: Self-pay | Admitting: Student in an Organized Health Care Education/Training Program

## 2020-05-09 ENCOUNTER — Ambulatory Visit
Payer: Medicare Other | Attending: Student in an Organized Health Care Education/Training Program | Admitting: Student in an Organized Health Care Education/Training Program

## 2020-05-09 ENCOUNTER — Other Ambulatory Visit: Payer: Self-pay

## 2020-05-09 ENCOUNTER — Other Ambulatory Visit: Payer: Self-pay | Admitting: Endocrinology

## 2020-05-09 MED ORDER — GABAPENTIN 600 MG PO TABS: ORAL_TABLET | ORAL | 2 refills | Status: DC

## 2020-05-09 NOTE — Progress Notes (Signed)
I attempted to call the patient however no response. Voicemail left instructing patient to call front desk office at [phone] to reschedule appointment if she needs to. Gabapentin Rx sent in.  Requested Prescriptions   Signed Prescriptions Disp Refills  . gabapentin (NEURONTIN) 600 MG tablet 120 tablet 2    Sig: 600 mg in the morning, 600 mg in the afternoon, 1200 mg nightly.     -Dr Holley Raring

## 2020-05-10 ENCOUNTER — Telehealth: Payer: Self-pay | Admitting: Endocrinology

## 2020-05-10 ENCOUNTER — Other Ambulatory Visit: Payer: Self-pay | Admitting: *Deleted

## 2020-05-10 ENCOUNTER — Encounter: Payer: Self-pay | Admitting: *Deleted

## 2020-05-10 MED ORDER — ACCU-CHEK AVIVA PLUS VI STRP: ORAL_STRIP | 1 refills | Status: DC

## 2020-05-10 NOTE — Telephone Encounter (Signed)
Pt is calling regarding a refill on  ACCU-CHEK AVIVA PLUS test strip states pharmacy is not getting it. States that The actual picture of the prescription the pharmacy is not able to see it. Pt states that you can speak with her through her my chart.   [facility]acy is  [id number] [place] [place]

## 2020-05-10 NOTE — Telephone Encounter (Signed)
Rx has been sent, patient has been notified.

## 2020-05-15 NOTE — Patient Instructions (Signed)
Bolus before all meals and snacks eaten, unless you are treating a low blood sugar. Use list of foods eaten with carb amounts when calculating a carb amount.

## 2020-05-15 NOTE — Progress Notes (Signed)
Patient is here today to discuss pump usage.  She admits to not giving insuln for all meals and snacks.  Glooko download shows patient is bolusing 0-3X/day, Explained that when she does not give insulin, blood sugar go so high that she requires more insulin that what was needed if she took this before the meal, causing weight gain and high/low blood sugar readings--causing mood changes and irritability.   She agreed to take bolus for all meals and snacks.   Diet history: 3-4AM: wakes up and does a correction dose, because high from no bolus for HS snacks 8AM: bfast:  Meal shake, which she says she is putting the carbs in for this.  But then drinks cranberry juice 4-6 ounces which she does not bolus for.   Some breakfasts are 2 eggs with 2 pieces of toast:  Only putting in 11 carbs.  Reminded her that this is 30 carbs Coffee with coffee creamer during the day.  Told to put in 5 carbs for this. 12-1PM: burger with L&t.  And french fries.  Putting in 30 carbs for this. Needs to be 60 carbs, or having chips at home:  Need to put in 26 carbs for this.   3PM snack: fruit or chips.  No carbs.  Need 20-30 carbs for this 6PM: meat and veg.  Some veg. Have carbs like corn and peas.  She has not been putting carbs for these.  Told her that it is 15 grams for each 1/2 cup.  Will measure this, to determine if need 15 or closer to 30.   Sheet given with above carb amounts for certain foods that she is eating.   Summary: 1.Not bolusing for all meals and snacks 2. Not putting in correct amounts of carbs for meals eaten

## 2020-05-19 ENCOUNTER — Encounter: Payer: Self-pay | Admitting: Family Medicine

## 2020-05-20 ENCOUNTER — Other Ambulatory Visit: Payer: Self-pay | Admitting: *Deleted

## 2020-05-20 ENCOUNTER — Other Ambulatory Visit: Payer: Self-pay | Admitting: Endocrinology

## 2020-05-20 MED ORDER — ACCU-CHEK GUIDE VI STRP: ORAL_STRIP | 3 refills | Status: DC

## 2020-05-20 MED ORDER — ACCU-CHEK GUIDE W/DEVICE KIT: PACK | 0 refills | Status: DC

## 2020-05-20 MED ORDER — ACCU-CHEK AVIVA PLUS VI STRP: ORAL_STRIP | 1 refills | Status: DC

## 2020-05-20 MED ORDER — ACCU-CHEK SOFTCLIX LANCETS MISC: 1 refills | Status: DC

## 2020-05-20 MED ORDER — ACCU-CHEK AVIVA PLUS W/DEVICE KIT: PACK | 0 refills | Status: DC

## 2020-05-26 ENCOUNTER — Emergency Department: Payer: Medicare Other

## 2020-05-26 ENCOUNTER — Other Ambulatory Visit: Payer: Self-pay

## 2020-05-26 ENCOUNTER — Emergency Department
Admission: EM | Admit: 2020-05-26 | Discharge: 2020-05-26 | Disposition: A | Discharge status: Home / Self Care | Payer: Medicare Other | Attending: Emergency Medicine | Admitting: Emergency Medicine

## 2020-05-26 DIAGNOSIS — J449 Chronic obstructive pulmonary disease, unspecified: Secondary | ICD-10-CM | POA: Diagnosis not present

## 2020-05-26 DIAGNOSIS — Z7902 Long term (current) use of antithrombotics/antiplatelets: Secondary | ICD-10-CM | POA: Diagnosis not present

## 2020-05-26 DIAGNOSIS — Z794 Long term (current) use of insulin: Secondary | ICD-10-CM | POA: Diagnosis not present

## 2020-05-26 DIAGNOSIS — J45909 Unspecified asthma, uncomplicated: Secondary | ICD-10-CM | POA: Insufficient documentation

## 2020-05-26 DIAGNOSIS — E1129 Type 2 diabetes mellitus with other diabetic kidney complication: Secondary | ICD-10-CM | POA: Insufficient documentation

## 2020-05-26 DIAGNOSIS — N10 Acute tubulo-interstitial nephritis: Secondary | ICD-10-CM | POA: Diagnosis not present

## 2020-05-26 DIAGNOSIS — B9689 Other specified bacterial agents as the cause of diseases classified elsewhere: Secondary | ICD-10-CM | POA: Diagnosis not present

## 2020-05-26 DIAGNOSIS — Z7951 Long term (current) use of inhaled steroids: Secondary | ICD-10-CM | POA: Insufficient documentation

## 2020-05-26 DIAGNOSIS — Z87891 Personal history of nicotine dependence: Secondary | ICD-10-CM | POA: Diagnosis not present

## 2020-05-26 DIAGNOSIS — N12 Tubulo-interstitial nephritis, not specified as acute or chronic: Secondary | ICD-10-CM

## 2020-05-26 DIAGNOSIS — R319 Hematuria, unspecified: Secondary | ICD-10-CM | POA: Diagnosis present

## 2020-05-26 DIAGNOSIS — E1142 Type 2 diabetes mellitus with diabetic polyneuropathy: Secondary | ICD-10-CM | POA: Diagnosis not present

## 2020-05-26 DIAGNOSIS — I10 Essential (primary) hypertension: Secondary | ICD-10-CM | POA: Insufficient documentation

## 2020-05-26 DIAGNOSIS — I2510 Atherosclerotic heart disease of native coronary artery without angina pectoris: Secondary | ICD-10-CM | POA: Insufficient documentation

## 2020-05-26 LAB — CBC
HCT: 40.7 % (ref 36.0–46.0)
Hemoglobin: 12.8 g/dL (ref 12.0–15.0)
MCH: 28.4 pg (ref 26.0–34.0)
MCHC: 31.4 g/dL (ref 30.0–36.0)
MCV: 90.2 fL (ref 80.0–100.0)
Platelets: 253 10*3/uL (ref 150–400)
RBC: 4.51 MIL/uL (ref 3.87–5.11)
RDW: 14.4 % (ref 11.5–15.5)
WBC: 7.8 10*3/uL (ref 4.0–10.5)
nRBC: 0 % (ref 0.0–0.2)

## 2020-05-26 LAB — URINALYSIS, COMPLETE (UACMP) WITH MICROSCOPIC
Bilirubin Urine: NEGATIVE
Glucose, UA: >=500 mg/dL — AB
Ketones, ur: NEGATIVE mg/dL
Nitrite: NEGATIVE
Protein, ur: 100 mg/dL — AB
RBC / HPF: >50 RBC/hpf — ABNORMAL HIGH (ref 0–5)
Specific Gravity, Urine: 1.018 (ref 1.005–1.030)
Squamous Epithelial / HPF: NONE SEEN (ref 0–5)
WBC, UA: >50 WBC/hpf — ABNORMAL HIGH (ref 0–5)
pH: 6 (ref 5.0–8.0)

## 2020-05-26 LAB — BASIC METABOLIC PANEL
Anion gap: 12 (ref 5–15)
BUN: 13 mg/dL (ref 6–20)
CO2: 21 mmol/L — ABNORMAL LOW (ref 22–32)
Calcium: 8.9 mg/dL (ref 8.9–10.3)
Chloride: 104 mmol/L (ref 98–111)
Creatinine, Ser: 0.89 mg/dL (ref 0.44–1.00)
GFR, Estimated: >60 mL/min (ref >60)
Glucose, Bld: 258 mg/dL — ABNORMAL HIGH (ref 70–99)
Potassium: 4 mmol/L (ref 3.5–5.1)
Sodium: 137 mmol/L (ref 135–145)

## 2020-05-26 MED ORDER — CIPROFLOXACIN IN D5W 400 MG/200ML IV SOLN
400.0000 mg | Freq: Once | INTRAVENOUS | Status: AC
  Administered 2020-05-26: 400 mg via INTRAVENOUS
  Filled 2020-05-26: qty 200

## 2020-05-26 MED ORDER — MORPHINE SULFATE (PF) 4 MG/ML IV SOLN
4.0000 mg | Freq: Once | INTRAVENOUS | Status: AC
Start: 2020-05-26 — End: 2020-05-26
  Administered 2020-05-26: 4 mg via INTRAVENOUS
  Filled 2020-05-26: qty 1

## 2020-05-26 MED ORDER — CIPROFLOXACIN HCL 500 MG PO TABS: 500.0000 mg | ORAL_TABLET | Freq: Two times a day (BID) | ORAL | 0 refills | Status: AC

## 2020-05-26 MED ORDER — ONDANSETRON HCL 4 MG/2ML IJ SOLN
4.0000 mg | Freq: Once | INTRAMUSCULAR | Status: AC
  Administered 2020-05-26: 4 mg via INTRAVENOUS
  Filled 2020-05-26: qty 2

## 2020-05-26 MED ORDER — PHENAZOPYRIDINE HCL 100 MG PO TABS: 100.0000 mg | ORAL_TABLET | Freq: Three times a day (TID) | ORAL | 0 refills | Status: AC

## 2020-05-26 MED ORDER — SODIUM CHLORIDE 0.9 % IV BOLUS
1000.0000 mL | Freq: Once | INTRAVENOUS | Status: AC
  Administered 2020-05-26: 1000 mL via INTRAVENOUS

## 2020-05-26 MED ORDER — SODIUM CHLORIDE 0.9 % IV SOLN
1.0000 g | Freq: Once | INTRAVENOUS | Status: AC
  Administered 2020-05-26: 1 g via INTRAVENOUS
  Filled 2020-05-26: qty 10

## 2020-05-26 NOTE — ED Notes (Signed)
Pt to toilet with cane

## 2020-05-26 NOTE — ED Triage Notes (Signed)
Pt arrives to ER c/o of low back pain and urinating blood. States dysuria. States feels like when she had a kidney infection/AKI. A&O, in wheelchair.

## 2020-05-26 NOTE — ED Provider Notes (Signed)
[facility] Emergency Department Provider Note  ____________________________________________  Time seen: Approximately 12:44 PM  I have reviewed the triage vital signs and the nursing notes.   HISTORY  Chief Complaint Back Pain and Hematuria    HPI [name] is a 55 y.o. female who presents the emergency department complaining of right flank pain, hematuria.  Patient states that she has a history of nephrolithiasis.  She has had multiple surgeries for kidney stones that became obstructed.  She states that she has had multiple stents placed and has had lithotripsy in the past.  Patient states that she developed flank pain and some mild hematuria yesterday.  Pain and hematuria are increasing today and she presents for evaluation.  She denies any dysuria.  No fevers or chills.  No abdominal pain.  Patient denies any diarrhea or constipation.  Medical history as described below with history of anxiety, asthma, COPD, diabetes, diabetic retinopathy, diabetic neuropathy, mild diastolic dysfunction, hypertension, migraines, MI, seizures, CVA, sleep apnea.         Past Medical History:  Diagnosis Date  . Anxiety and depression 09/13/2006   Qualifier: Diagnosis of  By: Hassell Done [name], Tori Milks    . Arthritis    joint pain   . Asthma   . COPD (chronic obstructive pulmonary disease) (HCC)    chronic bronchitis   . Depression   . Diabetes mellitus    since age 64; type 2 IDDM  . Diabetic neuropathy, painful ([place])    FEET AND HANDS  . Diabetic retinopathy ([place])   . Diastolic dysfunction    a.  Echo 11/17: EF 60-65%, mild LVH, no RWMA, Gr1DD, mild AI, dilated aortic root measuring 38 mm, mildly dilated ascending aorta  . Dilated aortic root ([place])    12m by echo 11/2015  . Dizziness    secondary to diabetes and hypertension   . Epilepsy idiopathic petit mal ([place]    last seizure 2012;controlled w/ topomax  . GERD (gastroesophageal reflux disease)   .  Glaucoma    NOT ON ANY EYE DROPS   . Headache(784.0)    migraines   . Heart murmur    born with   . History of stress test    a. 11/17: Normal perfusion, EF 53%, normal study  . Hx of blood clots    hematomas removed from left side of brain from [id number]   . Hyperlipidemia   . Hypertension   . Legally blind    left eye   . MIGRAINE HEADACHE 09/14/2006   Qualifier: Diagnosis of  By: MarHassell DoneP, NykTori Milks . Myocardial infarction ([place]  a.  Patient reported history of without objective documentation  . Nephrolithiasis    frequent urination , urination at nite  PT SEEN IN ER 09/22/11 FOR BACK AND RT SIDED PAIN--HAS KNOWN STONE RT URETER AND UA IN ER SHOWED UTI  . Pain 09/23/11   LOWER BACK AND RIGHT SIDE--PT HAS RIGHT URETERAL STONE  . Pneumonia    hx of 2009  . Pyelonephritis   . Seizures ([place] . Sleep apnea    [facility] ; mild  . Stress incontinence   . [facility]  [facility]  RESIDUAL LEFT LEG WEAKNESS--NO OTHER RESIDUAL PROBLEMS    Patient Active Problem List   Diagnosis Date Noted  . PAD (peripheral artery disease) ([place]1/05/2020  . Leg pain 01/24/2020  . Leg  pain, bilateral 01/11/2020  . Numbness and tingling 01/11/2020  . Abnormal ankle brachial index (ABI) 12/18/2019  . Hypoglycemia 05/17/2019  . Hypoglycemia associated with type 2 diabetes mellitus ([place]) 05/16/2019  . Chest pain 04/27/2019  . Type 2 diabetes mellitus without complication, with long-term current use of insulin ([place]) 04/27/2019  . COPD (chronic obstructive pulmonary disease) ([place]) 04/27/2019  . CAD (coronary artery disease) 04/27/2019  . H/O: stroke with residual effects   . Myalgia 04/19/2019  . 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 Non-intractable vomiting 03/01/2019  . History of CVA (cerebrovascular accident) 07/08/2018  . History of seizure 07/08/2018  . Diabetic polyneuropathy associated with type 2 diabetes mellitus ([place]) 03/02/2018  .  Obesity (BMI 30-39.9) 03/02/2018  . Vitamin D deficiency 01/24/2018  . Vulvovaginal candidiasis 01/24/2018  . Hypokalemia 06/08/2017  . Overactive bladder 05/18/2017  . Asthma 06/22/2016  . Dilated aortic root ([place])   . Sacroiliac dysfunction 10/09/2014  . Chronic low back pain 09/03/2014  . CVA, old, hemiparesis ([place]) 09/03/2014  . OSA (obstructive sleep apnea) 05/15/2014  . Restrictive airway disease 05/15/2014  . Colitis 02/22/2014  . Type 2 diabetes, uncontrolled, with renal manifestation ([place]) 01/21/2014  . Chronic pain syndrome 01/21/2014  . Headache disorder 09/07/2013  . Diabetic neuropathy ([place]) 05/23/2013  . Mixed urge and stress incontinence 08/25/2011  . Ureteral stricture, right 08/25/2011  . Pyelonephritis 08/22/2011  . Nephrolithiasis 08/22/2011  . Incisional hernia 05/10/2011  . Morbid obesity ([place]) 05/10/2011  . Tachycardia 05/08/2011  . BLINDNESS, [place] DEFINITION 09/14/2006  . Hyperlipemia 09/13/2006  . Essential hypertension 09/13/2006  . MYOCARDIAL INFARCTION, HX OF 09/13/2006  . GERD 09/13/2006  . Seizure disorder ([place]) 09/13/2006    Past Surgical History:  Procedure Laterality Date  . BRAIN HEMATOMA EVACUATION     five procedures total, first procedure when 72 months old, last at 55 years of age.  . CYSTOSCOPY W/ RETROGRADES  09/24/2011   Procedure: CYSTOSCOPY WITH RETROGRADE PYELOGRAM;  Surgeon: Malka So, MD;  Location: [facility];  Service: Urology;  Laterality: Right;  . CYSTOSCOPY WITH URETEROSCOPY  09/24/2011   Procedure: CYSTOSCOPY WITH URETEROSCOPY;  Surgeon: Malka So, MD;  Location: [facility];  Service: Urology;  Laterality: Right;  Balloon dilation right ureter   . CYSTOSCOPY/RETROGRADE/URETEROSCOPY  08/24/2011   Procedure: CYSTOSCOPY/RETROGRADE/URETEROSCOPY;  Surgeon: Molli Hazard, MD;  Location: [facility];  Service: Urology;  Laterality: Right;  Cysto, Right retrograde Pyelogram, right stent placement.   Fatima Blank HERNIA REPAIR   05/05/2011   Procedure: LAPAROSCOPIC INCISIONAL HERNIA;  Surgeon: Rolm Bookbinder, MD;  Location: [place];  Service: General;  Laterality: N/A;  . kidney stone removal    . MASS EXCISION  05/05/2011   Procedure: EXCISION MASS;  Surgeon: Rolm Bookbinder, MD;  Location: [place];  Service: General;  Laterality: Right;  . PCNL    . RETINAL DETACHMENT SURGERY Left 1990  . RIGHT/LEFT HEART CATH AND CORONARY ANGIOGRAPHY N/A 04/28/2019   Procedure: RIGHT/LEFT HEART CATH AND CORONARY ANGIOGRAPHY;  Surgeon: Yolonda Kida, MD;  Location: [place];  Service: Cardiovascular;  Laterality: N/A;  . SHUNT REMOVAL     shunt inserted at age 1 removed at age 78   . VAGINAL HYSTERECTOMY  1996    Prior to Admission medications   Medication Sig Start Date End Date Taking? Authorizing Provider  ciprofloxacin (CIPRO) 500 MG tablet Take 1 tablet (500 mg total) by mouth 2 (two) times daily for 7 days. 05/26/20 06/02/20 Yes  Wade Asebedo, Charline Bills, [name]  phenazopyridine (PYRIDIUM) 100 MG tablet Take 1 tablet (100 mg total) by mouth 3 (three) times daily for 2 days. 05/26/20 05/28/20 Yes Keiry Kowal, Charline Bills, [name]  Accu-Chek Softclix Lancets lancets Use to check blood sugar 4 times per day Dx code E11.9 05/20/20   Elayne Snare, MD  albuterol (ACCUNEB) 1.25 MG/3ML nebulizer solution Inhale into the lungs. Use 3 mls by nebulization every 6 hours as needed for wheezing 01/26/19 01/26/20  [provider]  albuterol (PROAIR HFA) 108 (90 Base) MCG/ACT inhaler INHALE 2 PUFFS BY MOUTH EVERY 6 HOURS AS NEEDED FOR WHEEZING OR SHORTNESS OF BREATH 01/24/18   Hubbard Hartshorn, [name]  baclofen (LIORESAL) 10 MG tablet Take 1 tablet (10 mg total) by mouth 3 (three) times daily. 10/10/19   Towanda Malkin, MD  Blood Glucose Monitoring Suppl (ACCU-CHEK AVIVA PLUS) w/Device KIT Use to check blood sugar 4 times per day, DX code E11.9 05/20/20   Elayne Snare, MD  Blood Glucose Monitoring Suppl (ACCU-CHEK GUIDE) w/Device KIT Use to check  blood sugar 4 times per day Dx code E11.9 05/20/20   Elayne Snare, MD  budesonide (PULMICORT) 0.5 MG/2ML nebulizer solution Inhale 0.5 mg into the lungs daily. Take 2 mls by nebulizations once daily  01/26/19 01/26/20  [provider]  Calcium Carbonate-Vitamin D (CALCIUM-VITAMIN D3 PO) D3 500- calcium 630 mg Take 1 capsule by mouth once daily    [provider]  carvedilol (COREG) 12.5 MG tablet TAKE 1 TABLET (12.5 MG TOTAL) BY MOUTH 2 (TWO) TIMES DAILY AFTER A MEAL. 10/02/19 11/01/19  Towanda Malkin, MD  clopidogrel (PLAVIX) 75 MG tablet Take 1 tablet (75 mg total) by mouth daily. 10/10/19   Towanda Malkin, MD  Continuous Blood Gluc Sensor (FREESTYLE LIBRE 2 SENSOR) MISC Use 1 sensor every 14 days 02/19/20   Elayne Snare, MD  cycloSPORINE (RESTASIS) 0.05 % ophthalmic emulsion Place 2 drops into both eyes 2 (two) times daily.    [provider]  dapagliflozin propanediol (FARXIGA) 10 MG TABS tablet Take 1 tablet (10 mg total) by mouth daily before breakfast. 01/31/20   Elayne Snare, MD  Dulaglutide (TRULICITY) 3 IO/2.7OJ SOPN Inject 3 mg into the skin once a week. 02/29/20   Elayne Snare, MD  DULoxetine (CYMBALTA) 20 MG capsule TAKE 2 CAPSULES EVERY MORNING AND 1 CAPSULE EVERY NIGHT. 04/01/20   Delsa Grana, [name]  DUPIXENT 300 MG/2ML SOPN Inject 300 mg into the skin every 14 (fourteen) days. 04/04/19   [provider]  EPINEPHrine 0.3 mg/0.3 mL IJ SOAJ injection Inject 0.3 mg into the muscle as needed for anaphylaxis.  Patient not taking: Reported on 04/09/2020 03/29/19   [provider]  ezetimibe (ZETIA) 10 MG tablet Take 1 tablet (10 mg total) by mouth daily. 04/18/19   Towanda Malkin, MD  fluticasone (FLONASE) 50 MCG/ACT nasal spray Place 2 sprays into both nostrils daily. 04/18/19   Towanda Malkin, MD  gabapentin (NEURONTIN) 600 MG tablet 600 mg in the morning, 600 mg in the afternoon, 1200 mg nightly. 05/09/20   Gillis Santa, MD   glucose blood (ACCU-CHEK GUIDE) test strip Use as instructed to check blood sugar 4 times per day Dx code E11.9 05/20/20   Elayne Snare, MD  Insulin Disposable Pump (OMNIPOD DASH SYSTEM) KIT by Does not apply route.    [provider]  insulin lispro (HUMALOG) 100 UNIT/ML injection USE UP TO 150 UNITS/DAY IN INSULIN PUMP 05/07/20   Elayne Snare,  MD  levETIRAcetam (KEPPRA) 500 MG tablet Take 500 mg by mouth 2 (two) times daily. 1 TAB IN THE MORNING AND .5 TABS AT NIGHT 07/08/18   [provider]  losartan (COZAAR) 100 MG tablet Take 1 tablet (100 mg total) by mouth daily. 06/01/19   Delsa Grana, [name]  mirabegron ER (MYRBETRIQ) 50 MG TB24 tablet Take 1 tablet (50 mg total) by mouth daily. 04/18/19   Towanda Malkin, MD  nitroGLYCERIN (NITROSTAT) 0.4 MG SL tablet  11/18/19   [provider]  pantoprazole (PROTONIX) 40 MG tablet Take 1 tablet (40 mg total) by mouth daily. 10/10/19   Towanda Malkin, MD  polyethylene glycol (MIRALAX / GLYCOLAX) 17 g packet Take 17 g by mouth daily as needed for mild constipation. 06/01/19   Delsa Grana, [name]  potassium chloride SA (KLOR-CON M20) 20 MEQ tablet You have not needed potassium while in the hospital.  Hold until outpatient followup. 05/21/19   Enzo Bi, MD  simvastatin (ZOCOR) 40 MG tablet Take 40 mg by mouth daily.    [provider]  spironolactone (ALDACTONE) 25 MG tablet Take 1 tablet by mouth daily. 01/23/19   [provider]  terconazole (TERAZOL 7) 0.4 % vaginal cream Place 1 applicator vaginally at bedtime as needed (irritation). 03/06/20   Delsa Grana, [name]  WIXELA INHUB 250-50 MCG/DOSE AEPB Inhale 1 puff into the lungs 2 (two) times daily. 01/22/20   [provider]    Allergies Codeine and Lactose intolerance (gi)  Family History  Problem Relation Age of Onset  . Uterine cancer Mother   . Hypertension Mother   . Cancer Mother   . Brain cancer Maternal Grandmother   . Hypertension  Maternal Grandmother   . Birth defects Daughter   . Hypertension Daughter   . Cirrhosis Maternal Grandfather   . ADD / ADHD Maternal Grandfather   . Birth defects Maternal Grandfather   . Diabetes Maternal Grandfather   . Anesthesia problems Neg Hx   . Colon cancer Neg Hx   . Esophageal cancer Neg Hx   . Pancreatic cancer Neg Hx     Social History Social History   Tobacco Use  . Smoking status: Former Smoker    Packs/day: 1.00    Years: 15.00    Pack years: 15.00    Types: Cigarettes    Quit date: 01/20/1987    Years since quitting: 33.3  . Smokeless tobacco: Never Used  . Tobacco comment: quit more than 20 years  Vaping Use  . Vaping Use: Never used  Substance Use Topics  . Alcohol use: Never    Alcohol/week: 0.0 standard drinks  . Drug use: No    Comment: hx of marijuana use no longer uses      Review of Systems  Constitutional: No fever/chills Eyes: No visual changes. No discharge ENT: No upper respiratory complaints. Cardiovascular: no chest pain. Respiratory: no cough. No SOB. Gastrointestinal: No abdominal pain.  No nausea, no vomiting.  No diarrhea.  No constipation. Genitourinary: Negative for dysuria.  Positive for right flank pain and hematuria Musculoskeletal: Negative for musculoskeletal pain. Skin: Negative for rash, abrasions, lacerations, ecchymosis. Neurological: Negative for headaches, focal weakness or numbness.  10 System ROS otherwise negative.  ____________________________________________   PHYSICAL EXAM:  VITAL SIGNS: ED Triage Vitals  Enc Vitals Group     BP 05/26/20 1118 (!) 178/108     Pulse Rate 05/26/20 1118 91     Resp 05/26/20 1118 16  Temp 05/26/20 1118 98.5 F (36.9 C)     Temp Source 05/26/20 1118 Oral     SpO2 05/26/20 1118 98 %     Weight 05/26/20 1117 251 lb (113.9 kg)     Height 05/26/20 1117 _0  (1.702 m)     Head Circumference --      Peak Flow --      Pain Score 05/26/20 1116 9     Pain Loc --      Pain  Edu? --      Excl. in [place]? --      Constitutional: Alert and oriented. Well appearing and in no acute distress. Eyes: Conjunctivae are normal. PERRL. EOMI. Head: Atraumatic. ENT:      Ears:       Nose: No congestion/rhinnorhea.      Mouth/Throat: Mucous membranes are moist.  Neck: No stridor.    Cardiovascular: Normal rate, regular rhythm. Normal S1 and S2.  Good peripheral circulation. Respiratory: Normal respiratory effort without tachypnea or retractions. Lungs CTAB. Good air entry to the bases with no decreased or absent breath sounds. Gastrointestinal: Bowel sounds 4 quadrants. Soft and nontender to palpation. No guarding or rigidity. No palpable masses. No distention.  Positive for right-sided CVA tenderness. Musculoskeletal: Full range of motion to all extremities. No gross deformities appreciated. Neurologic:  Normal speech and language. No gross focal neurologic deficits are appreciated.  Skin:  Skin is warm, dry and intact. No rash noted. Psychiatric: Mood and affect are normal. Speech and behavior are normal. Patient exhibits appropriate insight and judgement.   ____________________________________________   LABS (all labs ordered are listed, but only abnormal results are displayed)  Labs Reviewed  URINALYSIS, COMPLETE (UACMP) WITH MICROSCOPIC - Abnormal; Notable for the following components:      Result Value   Color, Urine AMBER (*)    APPearance TURBID (*)    Glucose, UA >=500 (*)    Hgb urine dipstick LARGE (*)    Protein, ur 100 (*)    Leukocytes,Ua LARGE (*)    RBC / HPF >50 (*)    WBC, UA >50 (*)    Bacteria, UA MANY (*)    All other components within normal limits  BASIC METABOLIC PANEL - Abnormal; Notable for the following components:   CO2 21 (*)    Glucose, Bld 258 (*)    All other components within normal limits  URINE CULTURE  CBC    ____________________________________________  EKG   ____________________________________________  RADIOLOGY I personally viewed and evaluated these images as part of my medical decision making, as well as reviewing the written report by the radiologist.  ED Provider Interpretation: No evidence of obstructive uropathy.  No evidence of nephrolithiasis in the ureters.  No hydronephrosis.  CT Renal Stone Study  Result Date: 05/26/2020 CLINICAL DATA:  Low back pain, flank pain. EXAM: CT ABDOMEN AND PELVIS WITHOUT CONTRAST TECHNIQUE: Multidetector CT imaging of the abdomen and pelvis was performed following the standard protocol without IV contrast. COMPARISON:  CT abdomen pelvis dated 12/05/2018. FINDINGS: Lower chest: Mild left basilar atelectasis. Hepatobiliary: No focal liver abnormality is seen. No gallstones, gallbladder wall thickening, or biliary dilatation. Pancreas: Unremarkable. No pancreatic ductal dilatation or surrounding inflammatory changes. Spleen: Normal in size without focal abnormality. Adrenals/Urinary Tract: A benign adenoma in the right adrenal gland measures 2.1 cm. Bilateral nonobstructive renal calculi measure up to 5 mm on the right and 2 mm on the left. No hydronephrosis. Calcification adjacent to the left ureter is  likely within a blood vessel (series 5, image 96 and series 2 image 41). Bladder is unremarkable. Stomach/Bowel: Stomach is within normal limits. Appendix appears normal. No evidence of bowel wall thickening, distention, or inflammatory changes. Vascular/Lymphatic: No significant vascular findings are present. No enlarged abdominal or pelvic lymph nodes. Reproductive: Status post hysterectomy. No adnexal masses. Other: No abdominal wall hernia or abnormality. No abdominopelvic ascites. Musculoskeletal: Degenerative changes are seen in the spine. IMPRESSION: Nonobstructive bilateral renal calculi.  No hydronephrosis. Electronically Signed   By: Zerita Boers M.D.    On: 05/26/2020 13:38    ____________________________________________    PROCEDURES  Procedure(s) performed:    Procedures    Medications  ciprofloxacin (CIPRO) IVPB 400 mg (400 mg Intravenous New Bag/Given 05/26/20 1621)  sodium chloride 0.9 % bolus 1,000 mL (0 mLs Intravenous Stopped 05/26/20 1626)  morphine 4 MG/ML injection 4 mg (4 mg Intravenous Given 05/26/20 1316)  ondansetron (ZOFRAN) injection 4 mg (4 mg Intravenous Given 05/26/20 1315)  cefTRIAXone (ROCEPHIN) 1 g in sodium chloride 0.9 % 100 mL IVPB (0 g Intravenous Stopped 05/26/20 1621)     ____________________________________________   INITIAL IMPRESSION / ASSESSMENT AND PLAN / ED COURSE  Pertinent labs & imaging results that were available during my care of the patient were reviewed by me and considered in my medical decision making (see chart for details).  Review of the [place] CSRS was performed in accordance of the [place] prior to dispensing any controlled drugs.           Patient's diagnosis is consistent with pyelonephritis.  Patient presented to emergency department right flank pain and hematuria.  She has a history of nephrolithiasis and has had both stents and lithotripsy in the past.  Differential included nephrolithiasis, pyelonephritis, UTI with nephrolithiasis.  Imaging and labs were obtained.  Patient has stones in bilateral kidneys but there is no evidence of transition through the ureters or evidence of hydronephrosis.  Given the symptoms with CVA tenderness and findings on urine I feel this is more likely pyelonephritis.  Patient is nontoxic-appearing at this time.  We will start IV antibiotics and transition to oral antibiotic at home.  Return precautions discussed with the patient..  Follow-up with primary care as needed.  Patient is given ED precautions to return to the ED for any worsening or new symptoms.     ____________________________________________  FINAL CLINICAL IMPRESSION(S) / ED  DIAGNOSES  Final diagnoses:  Pyelonephritis      NEW MEDICATIONS STARTED DURING THIS VISIT:  ED Discharge Orders         Ordered    ciprofloxacin (CIPRO) 500 MG tablet  2 times daily        05/26/20 1540    phenazopyridine (PYRIDIUM) 100 MG tablet  3 times daily        05/26/20 1540              This chart was dictated using voice recognition software/Dragon. Despite best efforts to proofread, errors can occur which can change the meaning. Any change was purely unintentional.    Darletta Moll, [name] 05/26/20 1635    Naaman Plummer, MD 05/27/20 1818

## 2020-05-26 NOTE — ED Notes (Signed)
Patient at CT scan.

## 2020-05-27 ENCOUNTER — Other Ambulatory Visit: Payer: Self-pay

## 2020-05-27 ENCOUNTER — Encounter: Payer: Self-pay | Admitting: Family Medicine

## 2020-05-27 ENCOUNTER — Telehealth: Payer: Self-pay

## 2020-05-27 DIAGNOSIS — Z794 Long term (current) use of insulin: Secondary | ICD-10-CM

## 2020-05-27 DIAGNOSIS — E1165 Type 2 diabetes mellitus with hyperglycemia: Secondary | ICD-10-CM

## 2020-05-27 MED ORDER — ACCU-CHEK GUIDE VI STRP: ORAL_STRIP | 3 refills | Status: DC

## 2020-05-27 MED ORDER — ACCU-CHEK SOFTCLIX LANCETS MISC: 1 refills | Status: DC

## 2020-05-27 NOTE — Telephone Encounter (Signed)
Copied from [place] [id number]. Topic: General - Other >> May 27, 2020  8:25 AM Celene Kras wrote: Reason for CRM: PT called stating that she was seen in ED on 05/26/20. She states that she was diagnosed with a bladder & kidney infection and is needing to have an ED follow up due to blood in urine. Please advise.

## 2020-05-27 NOTE — Telephone Encounter (Signed)
lvm for pt to call and schedule an appt with Kathrine Haddock [name] for next week

## 2020-05-28 ENCOUNTER — Other Ambulatory Visit: Payer: Self-pay | Admitting: *Deleted

## 2020-05-29 LAB — URINE CULTURE: Culture: >=100000 — AB

## 2020-06-04 ENCOUNTER — Ambulatory Visit (INDEPENDENT_AMBULATORY_CARE_PROVIDER_SITE_OTHER): Payer: Medicare Other | Admitting: Unknown Physician Specialty

## 2020-06-04 ENCOUNTER — Other Ambulatory Visit: Payer: Self-pay

## 2020-06-04 ENCOUNTER — Encounter: Payer: Self-pay | Admitting: Unknown Physician Specialty

## 2020-06-04 VITALS — BP 128/86 | HR 94 | Temp 98.4°F | Resp 18 | Ht 65.0 in | Wt 257.6 lb

## 2020-06-04 DIAGNOSIS — N3001 Acute cystitis with hematuria: Secondary | ICD-10-CM | POA: Diagnosis not present

## 2020-06-04 DIAGNOSIS — N12 Tubulo-interstitial nephritis, not specified as acute or chronic: Secondary | ICD-10-CM | POA: Diagnosis not present

## 2020-06-04 DIAGNOSIS — N200 Calculus of kidney: Secondary | ICD-10-CM

## 2020-06-04 LAB — POCT URINALYSIS DIPSTICK
Bilirubin, UA: NEGATIVE
Glucose, UA: NEGATIVE
Ketones, UA: NEGATIVE
Nitrite, UA: NEGATIVE
Protein, UA: POSITIVE — AB
Spec Grav, UA: 1.015 (ref 1.010–1.025)
Urobilinogen, UA: 0.2 E.U./dL
pH, UA: 5 (ref 5.0–8.0)

## 2020-06-04 NOTE — Progress Notes (Signed)
BP 128/86   Pulse 94   Temp 98.4 F (36.9 C) (Oral)   Resp 18   Ht 5' 5"  (1.651 m)   Wt 257 lb 9.6 oz (116.8 kg)   LMP  (LMP Unknown)   SpO2 97%   BMI 42.87 kg/m    Subjective:    Patient ID: [name], female    DOB: Jun 26, 1965, 55 y.o.   MRN: [id number]  HPI: [name] is a 55 y.o. female  Chief Complaint  Patient presents with  . Follow-up    Kidney stones, blood in urine   Hematuria/Pyelonephritis Pt was in the ER Sunday due to back pain, hematuria, and nausea.  She was diagnosed with pyelonephritis and nephrolith esis.  She is being followed by [facility] for known kidney stones.  No fever.  Back pain is improving.  Pt given Cipro in the ER and urine culture at that time shows sensitivity  Relevant past medical, surgical, family and social history reviewed and updated as indicated. Interim medical history since our last visit reviewed. Allergies and medications reviewed and updated.  Review of Systems  Constitutional: Negative.   Genitourinary: Negative.   Musculoskeletal: Positive for arthralgias and back pain.    Per HPI unless specifically indicated above     Objective:    BP 128/86   Pulse 94   Temp 98.4 F (36.9 C) (Oral)   Resp 18   Ht 5' 5"  (1.651 m)   Wt 257 lb 9.6 oz (116.8 kg)   LMP  (LMP Unknown)   SpO2 97%   BMI 42.87 kg/m   Wt Readings from Last 3 Encounters:  06/04/20 257 lb 9.6 oz (116.8 kg)  05/26/20 251 lb (113.9 kg)  04/09/20 258 lb (117 kg)    Physical Exam Constitutional:      General: She is not in acute distress.    Appearance: Normal appearance. She is well-developed.  HENT:     Head: Normocephalic and atraumatic.  Eyes:     General: Lids are normal. No scleral icterus.       Right eye: No discharge.        Left eye: No discharge.     Conjunctiva/sclera: Conjunctivae normal.  Neck:     Vascular: No carotid bruit or JVD.  Cardiovascular:     Rate and Rhythm: Normal rate and regular rhythm.      Heart sounds: Normal heart sounds.  Pulmonary:     Effort: Pulmonary effort is normal.     Breath sounds: Normal breath sounds.  Abdominal:     Palpations: There is no hepatomegaly or splenomegaly.     Tenderness: There is no abdominal tenderness. There is no right CVA tenderness or left CVA tenderness.  Musculoskeletal:        General: Normal range of motion.     Cervical back: Normal range of motion and neck supple.  Skin:    General: Skin is warm and dry.     Coloration: Skin is not pale.     Findings: No rash.  Neurological:     Mental Status: She is alert and oriented to person, place, and time.  Psychiatric:        Behavior: Behavior normal.        Thought Content: Thought content normal.        Judgment: Judgment normal.     Results for orders placed or performed in visit on 06/04/20  POCT urinalysis dipstick  Result Value Ref Range  Color, UA yellow    Clarity, UA cloudy    Glucose, UA Negative Negative   Bilirubin, UA negative    Ketones, UA negative    Spec Grav, UA 1.015 1.010 - 1.025   Blood, UA large    pH, UA 5.0 5.0 - 8.0   Protein, UA Positive (A) Negative   Urobilinogen, UA 0.2 0.2 or 1.0 E.U./dL   Nitrite, UA negative    Leukocytes, UA Large (3+) (A) Negative   Appearance cloudy    Odor none       Assessment & Plan:   Problem List Items Addressed This Visit      Unprioritized   Nephrolithiasis    Following with Urology next month      Pyelonephritis    Other Visit Diagnoses    Acute cystitis with hematuria    -  Primary   Relevant Orders   POCT urinalysis dipstick (Completed)   Urinalysis, microscopic only       Follow up plan: Return urine culture results.

## 2020-06-04 NOTE — Assessment & Plan Note (Signed)
Following with Urology next month

## 2020-06-05 LAB — URINALYSIS, MICROSCOPIC ONLY: Hyaline Cast: NONE SEEN /LPF

## 2020-06-10 ENCOUNTER — Other Ambulatory Visit (INDEPENDENT_AMBULATORY_CARE_PROVIDER_SITE_OTHER): Payer: Medicare Other

## 2020-06-10 ENCOUNTER — Other Ambulatory Visit: Payer: Self-pay

## 2020-06-10 ENCOUNTER — Other Ambulatory Visit: Payer: Medicare Other

## 2020-06-10 DIAGNOSIS — Z794 Long term (current) use of insulin: Secondary | ICD-10-CM | POA: Diagnosis not present

## 2020-06-10 DIAGNOSIS — E1165 Type 2 diabetes mellitus with hyperglycemia: Secondary | ICD-10-CM | POA: Diagnosis not present

## 2020-06-10 DIAGNOSIS — E782 Mixed hyperlipidemia: Secondary | ICD-10-CM | POA: Diagnosis not present

## 2020-06-10 LAB — HEMOGLOBIN A1C: Hgb A1c MFr Bld: 7.8 % — ABNORMAL HIGH (ref 4.6–6.5)

## 2020-06-11 LAB — LIPID PANEL
Cholesterol: 169 mg/dL (ref 0–200)
HDL: 41.1 mg/dL (ref >39.00)
LDL Cholesterol: 93 mg/dL (ref 0–99)
NonHDL: 127.66
Total CHOL/HDL Ratio: 4
Triglycerides: 172 mg/dL — ABNORMAL HIGH (ref 0.0–149.0)
VLDL: 34.4 mg/dL (ref 0.0–40.0)

## 2020-06-11 LAB — BASIC METABOLIC PANEL
BUN: 17 mg/dL (ref 6–23)
CO2: 25 mEq/L (ref 19–32)
Calcium: 9.3 mg/dL (ref 8.4–10.5)
Chloride: 104 mEq/L (ref 96–112)
Creatinine, Ser: 1.17 mg/dL (ref 0.40–1.20)
GFR: 52.45 mL/min — ABNORMAL LOW (ref >60.00)
Glucose, Bld: 154 mg/dL — ABNORMAL HIGH (ref 70–99)
Potassium: 4.3 mEq/L (ref 3.5–5.1)
Sodium: 140 mEq/L (ref 135–145)

## 2020-06-13 ENCOUNTER — Encounter: Payer: Self-pay | Admitting: Internal Medicine

## 2020-06-13 ENCOUNTER — Inpatient Hospital Stay
Admission: EM | Admit: 2020-06-13 | Discharge: 2020-06-20 | DRG: 389 | Disposition: A | Discharge status: Home Health | Payer: Medicare Other | Attending: Internal Medicine | Admitting: Internal Medicine

## 2020-06-13 ENCOUNTER — Other Ambulatory Visit: Payer: Self-pay

## 2020-06-13 ENCOUNTER — Ambulatory Visit: Payer: Medicare Other | Admitting: Endocrinology

## 2020-06-13 ENCOUNTER — Emergency Department: Payer: Medicare Other

## 2020-06-13 DIAGNOSIS — N3001 Acute cystitis with hematuria: Secondary | ICD-10-CM

## 2020-06-13 DIAGNOSIS — J449 Chronic obstructive pulmonary disease, unspecified: Secondary | ICD-10-CM | POA: Diagnosis present

## 2020-06-13 DIAGNOSIS — E1142 Type 2 diabetes mellitus with diabetic polyneuropathy: Secondary | ICD-10-CM | POA: Diagnosis present

## 2020-06-13 DIAGNOSIS — D3501 Benign neoplasm of right adrenal gland: Secondary | ICD-10-CM | POA: Diagnosis present

## 2020-06-13 DIAGNOSIS — E785 Hyperlipidemia, unspecified: Secondary | ICD-10-CM | POA: Diagnosis present

## 2020-06-13 DIAGNOSIS — R197 Diarrhea, unspecified: Secondary | ICD-10-CM

## 2020-06-13 DIAGNOSIS — R279 Unspecified lack of coordination: Secondary | ICD-10-CM | POA: Diagnosis not present

## 2020-06-13 DIAGNOSIS — I252 Old myocardial infarction: Secondary | ICD-10-CM

## 2020-06-13 DIAGNOSIS — Z87891 Personal history of nicotine dependence: Secondary | ICD-10-CM

## 2020-06-13 DIAGNOSIS — I2510 Atherosclerotic heart disease of native coronary artery without angina pectoris: Secondary | ICD-10-CM | POA: Diagnosis present

## 2020-06-13 DIAGNOSIS — Z808 Family history of malignant neoplasm of other organs or systems: Secondary | ICD-10-CM

## 2020-06-13 DIAGNOSIS — N10 Acute tubulo-interstitial nephritis: Secondary | ICD-10-CM | POA: Diagnosis not present

## 2020-06-13 DIAGNOSIS — E1122 Type 2 diabetes mellitus with diabetic chronic kidney disease: Secondary | ICD-10-CM

## 2020-06-13 DIAGNOSIS — Z9181 History of falling: Secondary | ICD-10-CM | POA: Diagnosis not present

## 2020-06-13 DIAGNOSIS — Z79899 Other long term (current) drug therapy: Secondary | ICD-10-CM

## 2020-06-13 DIAGNOSIS — E1169 Type 2 diabetes mellitus with other specified complication: Secondary | ICD-10-CM | POA: Diagnosis present

## 2020-06-13 DIAGNOSIS — F419 Anxiety disorder, unspecified: Secondary | ICD-10-CM | POA: Diagnosis present

## 2020-06-13 DIAGNOSIS — B9620 Unspecified Escherichia coli [E. coli] as the cause of diseases classified elsewhere: Secondary | ICD-10-CM | POA: Diagnosis present

## 2020-06-13 DIAGNOSIS — K529 Noninfective gastroenteritis and colitis, unspecified: Secondary | ICD-10-CM | POA: Diagnosis present

## 2020-06-13 DIAGNOSIS — M1990 Unspecified osteoarthritis, unspecified site: Secondary | ICD-10-CM | POA: Diagnosis present

## 2020-06-13 DIAGNOSIS — N390 Urinary tract infection, site not specified: Secondary | ICD-10-CM

## 2020-06-13 DIAGNOSIS — I10 Essential (primary) hypertension: Secondary | ICD-10-CM

## 2020-06-13 DIAGNOSIS — R569 Unspecified convulsions: Secondary | ICD-10-CM | POA: Diagnosis present

## 2020-06-13 DIAGNOSIS — I69351 Hemiplegia and hemiparesis following cerebral infarction affecting right dominant side: Secondary | ICD-10-CM

## 2020-06-13 DIAGNOSIS — I160 Hypertensive urgency: Secondary | ICD-10-CM | POA: Diagnosis not present

## 2020-06-13 DIAGNOSIS — K567 Ileus, unspecified: Secondary | ICD-10-CM | POA: Diagnosis present

## 2020-06-13 DIAGNOSIS — Z8673 Personal history of transient ischemic attack (TIA), and cerebral infarction without residual deficits: Secondary | ICD-10-CM | POA: Diagnosis not present

## 2020-06-13 DIAGNOSIS — E11319 Type 2 diabetes mellitus with unspecified diabetic retinopathy without macular edema: Secondary | ICD-10-CM | POA: Diagnosis present

## 2020-06-13 DIAGNOSIS — K56600 Partial intestinal obstruction, unspecified as to cause: Principal | ICD-10-CM | POA: Diagnosis present

## 2020-06-13 DIAGNOSIS — Z9641 Presence of insulin pump (external) (internal): Secondary | ICD-10-CM | POA: Diagnosis present

## 2020-06-13 DIAGNOSIS — I69352 Hemiplegia and hemiparesis following cerebral infarction affecting left dominant side: Secondary | ICD-10-CM | POA: Diagnosis not present

## 2020-06-13 DIAGNOSIS — I25119 Atherosclerotic heart disease of native coronary artery with unspecified angina pectoris: Secondary | ICD-10-CM | POA: Diagnosis not present

## 2020-06-13 DIAGNOSIS — Z7902 Long term (current) use of antithrombotics/antiplatelets: Secondary | ICD-10-CM

## 2020-06-13 DIAGNOSIS — Z8049 Family history of malignant neoplasm of other genital organs: Secondary | ICD-10-CM

## 2020-06-13 DIAGNOSIS — F32A Depression, unspecified: Secondary | ICD-10-CM | POA: Diagnosis present

## 2020-06-13 DIAGNOSIS — K56609 Unspecified intestinal obstruction, unspecified as to partial versus complete obstruction: Secondary | ICD-10-CM | POA: Diagnosis not present

## 2020-06-13 DIAGNOSIS — R1013 Epigastric pain: Secondary | ICD-10-CM | POA: Diagnosis not present

## 2020-06-13 DIAGNOSIS — R1011 Right upper quadrant pain: Secondary | ICD-10-CM | POA: Diagnosis not present

## 2020-06-13 DIAGNOSIS — Z20822 Contact with and (suspected) exposure to covid-19: Secondary | ICD-10-CM | POA: Diagnosis present

## 2020-06-13 DIAGNOSIS — Z6841 Body Mass Index (BMI) 40.0 and over, adult: Secondary | ICD-10-CM

## 2020-06-13 DIAGNOSIS — R109 Unspecified abdominal pain: Secondary | ICD-10-CM

## 2020-06-13 DIAGNOSIS — R531 Weakness: Secondary | ICD-10-CM

## 2020-06-13 DIAGNOSIS — Z885 Allergy status to narcotic agent status: Secondary | ICD-10-CM

## 2020-06-13 DIAGNOSIS — G4733 Obstructive sleep apnea (adult) (pediatric): Secondary | ICD-10-CM | POA: Diagnosis present

## 2020-06-13 DIAGNOSIS — H409 Unspecified glaucoma: Secondary | ICD-10-CM | POA: Diagnosis present

## 2020-06-13 DIAGNOSIS — E66813 Obesity, class 3: Secondary | ICD-10-CM

## 2020-06-13 DIAGNOSIS — R112 Nausea with vomiting, unspecified: Secondary | ICD-10-CM | POA: Diagnosis present

## 2020-06-13 DIAGNOSIS — E739 Lactose intolerance, unspecified: Secondary | ICD-10-CM | POA: Diagnosis present

## 2020-06-13 DIAGNOSIS — H548 Legal blindness, as defined in USA: Secondary | ICD-10-CM | POA: Diagnosis present

## 2020-06-13 DIAGNOSIS — Z833 Family history of diabetes mellitus: Secondary | ICD-10-CM

## 2020-06-13 DIAGNOSIS — G43909 Migraine, unspecified, not intractable, without status migrainosus: Secondary | ICD-10-CM | POA: Diagnosis present

## 2020-06-13 DIAGNOSIS — K219 Gastro-esophageal reflux disease without esophagitis: Secondary | ICD-10-CM | POA: Diagnosis present

## 2020-06-13 DIAGNOSIS — R29898 Other symptoms and signs involving the musculoskeletal system: Secondary | ICD-10-CM

## 2020-06-13 DIAGNOSIS — N200 Calculus of kidney: Secondary | ICD-10-CM | POA: Diagnosis present

## 2020-06-13 DIAGNOSIS — Z8249 Family history of ischemic heart disease and other diseases of the circulatory system: Secondary | ICD-10-CM

## 2020-06-13 DIAGNOSIS — Z794 Long term (current) use of insulin: Secondary | ICD-10-CM

## 2020-06-13 LAB — URINALYSIS, COMPLETE (UACMP) WITH MICROSCOPIC
Bilirubin Urine: NEGATIVE
Glucose, UA: NEGATIVE mg/dL
Ketones, ur: NEGATIVE mg/dL
Nitrite: POSITIVE — AB
Protein, ur: 100 mg/dL — AB
Specific Gravity, Urine: 1.018 (ref 1.005–1.030)
WBC, UA: >50 WBC/hpf — ABNORMAL HIGH (ref 0–5)
pH: 5 (ref 5.0–8.0)

## 2020-06-13 LAB — COMPREHENSIVE METABOLIC PANEL
ALT: 24 U/L (ref 0–44)
AST: 22 U/L (ref 15–41)
Albumin: 4.3 g/dL (ref 3.5–5.0)
Alkaline Phosphatase: 77 U/L (ref 38–126)
Anion gap: 13 (ref 5–15)
BUN: 16 mg/dL (ref 6–20)
CO2: 24 mmol/L (ref 22–32)
Calcium: 10 mg/dL (ref 8.9–10.3)
Chloride: 102 mmol/L (ref 98–111)
Creatinine, Ser: 0.87 mg/dL (ref 0.44–1.00)
GFR, Estimated: >60 mL/min (ref >60)
Glucose, Bld: 150 mg/dL — ABNORMAL HIGH (ref 70–99)
Potassium: 3.9 mmol/L (ref 3.5–5.1)
Sodium: 139 mmol/L (ref 135–145)
Total Bilirubin: 0.6 mg/dL (ref 0.3–1.2)
Total Protein: 8.7 g/dL — ABNORMAL HIGH (ref 6.5–8.1)

## 2020-06-13 LAB — CBC
HCT: 45.1 % (ref 36.0–46.0)
Hemoglobin: 14.7 g/dL (ref 12.0–15.0)
MCH: 28.7 pg (ref 26.0–34.0)
MCHC: 32.6 g/dL (ref 30.0–36.0)
MCV: 87.9 fL (ref 80.0–100.0)
Platelets: 282 10*3/uL (ref 150–400)
RBC: 5.13 MIL/uL — ABNORMAL HIGH (ref 3.87–5.11)
RDW: 14.5 % (ref 11.5–15.5)
WBC: 10.2 10*3/uL (ref 4.0–10.5)
nRBC: 0 % (ref 0.0–0.2)

## 2020-06-13 LAB — LIPASE, BLOOD: Lipase: 46 U/L (ref 11–51)

## 2020-06-13 LAB — RESP PANEL BY RT-PCR (FLU A&B, COVID) ARPGX2
Influenza A by PCR: NEGATIVE
Influenza B by PCR: NEGATIVE
SARS Coronavirus 2 by RT PCR: NEGATIVE

## 2020-06-13 LAB — GLUCOSE, CAPILLARY
Glucose-Capillary: 93 mg/dL (ref 70–99)
Glucose-Capillary: 138 mg/dL — ABNORMAL HIGH (ref 70–99)

## 2020-06-13 MED ORDER — PANTOPRAZOLE SODIUM 40 MG IV SOLR
40.0000 mg | Freq: Two times a day (BID) | INTRAVENOUS | Status: DC
  Administered 2020-06-13 – 2020-06-20 (×15): 40 mg via INTRAVENOUS
  Filled 2020-06-13 (×15): qty 40

## 2020-06-13 MED ORDER — CARVEDILOL 12.5 MG PO TABS
12.5000 mg | ORAL_TABLET | Freq: Two times a day (BID) | ORAL | Status: DC
  Administered 2020-06-14 – 2020-06-20 (×13): 12.5 mg via ORAL
  Filled 2020-06-13 (×14): qty 1

## 2020-06-13 MED ORDER — DULOXETINE HCL 20 MG PO CPEP
20.0000 mg | ORAL_CAPSULE | Freq: Every day | ORAL | Status: DC
  Administered 2020-06-14 – 2020-06-19 (×7): 20 mg via ORAL
  Filled 2020-06-13 (×8): qty 1

## 2020-06-13 MED ORDER — GABAPENTIN 600 MG PO TABS
600.0000 mg | ORAL_TABLET | Freq: Two times a day (BID) | ORAL | Status: DC
  Administered 2020-06-14 – 2020-06-20 (×13): 600 mg via ORAL
  Filled 2020-06-13 (×13): qty 1

## 2020-06-13 MED ORDER — ENOXAPARIN SODIUM 60 MG/0.6ML IJ SOSY
0.5000 mg/kg | PREFILLED_SYRINGE | INTRAMUSCULAR | Status: DC
  Administered 2020-06-13 – 2020-06-19 (×7): 57.5 mg via SUBCUTANEOUS
  Filled 2020-06-13 (×7): qty 0.6

## 2020-06-13 MED ORDER — INSULIN ASPART 100 UNIT/ML IJ SOLN
0.0000 [IU] | Freq: Three times a day (TID) | INTRAMUSCULAR | Status: DC
  Administered 2020-06-14 – 2020-06-15 (×3): 2 [IU] via SUBCUTANEOUS
  Administered 2020-06-15 – 2020-06-18 (×3): 3 [IU] via SUBCUTANEOUS
  Administered 2020-06-18 (×2): 2 [IU] via SUBCUTANEOUS
  Administered 2020-06-19: 3 [IU] via SUBCUTANEOUS
  Administered 2020-06-19: 5 [IU] via SUBCUTANEOUS
  Administered 2020-06-19 – 2020-06-20 (×2): 3 [IU] via SUBCUTANEOUS
  Filled 2020-06-13 (×12): qty 1

## 2020-06-13 MED ORDER — ALBUTEROL SULFATE (2.5 MG/3ML) 0.083% IN NEBU: 2.5000 mg | INHALATION_SOLUTION | Freq: Four times a day (QID) | RESPIRATORY_TRACT | Status: DC | PRN

## 2020-06-13 MED ORDER — PIPERACILLIN-TAZOBACTAM 3.375 G IVPB
3.3750 g | Freq: Three times a day (TID) | INTRAVENOUS | Status: DC
  Administered 2020-06-13 – 2020-06-15 (×5): 3.375 g via INTRAVENOUS
  Filled 2020-06-13 (×5): qty 50

## 2020-06-13 MED ORDER — INSULIN GLARGINE 100 UNIT/ML ~~LOC~~ SOLN
30.0000 [IU] | Freq: Every day | SUBCUTANEOUS | Status: DC
  Administered 2020-06-13 – 2020-06-19 (×7): 30 [IU] via SUBCUTANEOUS
  Filled 2020-06-13 (×9): qty 0.3

## 2020-06-13 MED ORDER — LOSARTAN POTASSIUM 50 MG PO TABS
100.0000 mg | ORAL_TABLET | Freq: Every day | ORAL | Status: DC
  Administered 2020-06-14 – 2020-06-20 (×7): 100 mg via ORAL
  Filled 2020-06-13 (×8): qty 2

## 2020-06-13 MED ORDER — SODIUM CHLORIDE 0.9 % IV SOLN: INTRAVENOUS | Status: DC

## 2020-06-13 MED ORDER — ONDANSETRON HCL 4 MG/2ML IJ SOLN
4.0000 mg | INTRAMUSCULAR | Status: AC
  Administered 2020-06-13: 4 mg via INTRAVENOUS
  Filled 2020-06-13: qty 2

## 2020-06-13 MED ORDER — MORPHINE SULFATE (PF) 2 MG/ML IV SOLN
2.0000 mg | INTRAVENOUS | Status: DC | PRN
  Administered 2020-06-13 – 2020-06-17 (×6): 2 mg via INTRAVENOUS
  Filled 2020-06-13 (×6): qty 1

## 2020-06-13 MED ORDER — SODIUM CHLORIDE 0.9 % IV BOLUS
1000.0000 mL | Freq: Once | INTRAVENOUS | Status: AC
  Administered 2020-06-13: 1000 mL via INTRAVENOUS

## 2020-06-13 MED ORDER — ONDANSETRON HCL 4 MG/2ML IJ SOLN: 4.0000 mg | Freq: Four times a day (QID) | INTRAMUSCULAR | Status: DC | PRN

## 2020-06-13 MED ORDER — ONDANSETRON HCL 4 MG PO TABS: 4.0000 mg | ORAL_TABLET | Freq: Four times a day (QID) | ORAL | Status: DC | PRN

## 2020-06-13 MED ORDER — LEVETIRACETAM 750 MG PO TABS
750.0000 mg | ORAL_TABLET | Freq: Every day | ORAL | Status: DC
  Administered 2020-06-14 – 2020-06-19 (×7): 750 mg via ORAL
  Filled 2020-06-13 (×8): qty 1

## 2020-06-13 MED ORDER — ACETAMINOPHEN 325 MG PO TABS
650.0000 mg | ORAL_TABLET | Freq: Four times a day (QID) | ORAL | Status: DC | PRN
  Administered 2020-06-19: 650 mg via ORAL
  Filled 2020-06-13: qty 2

## 2020-06-13 MED ORDER — GABAPENTIN 600 MG PO TABS
1200.0000 mg | ORAL_TABLET | Freq: Every day | ORAL | Status: DC
  Administered 2020-06-14 – 2020-06-19 (×7): 1200 mg via ORAL
  Filled 2020-06-13 (×7): qty 2

## 2020-06-13 MED ORDER — BUDESONIDE 0.5 MG/2ML IN SUSP
0.5000 mg | Freq: Every day | RESPIRATORY_TRACT | Status: DC
  Administered 2020-06-14 – 2020-06-20 (×7): 0.5 mg via RESPIRATORY_TRACT
  Filled 2020-06-13 (×8): qty 2

## 2020-06-13 MED ORDER — LEVETIRACETAM 500 MG PO TABS
500.0000 mg | ORAL_TABLET | Freq: Every morning | ORAL | Status: DC
  Administered 2020-06-14 – 2020-06-20 (×7): 500 mg via ORAL
  Filled 2020-06-13 (×7): qty 1

## 2020-06-13 MED ORDER — ACETAMINOPHEN 650 MG RE SUPP: 650.0000 mg | Freq: Four times a day (QID) | RECTAL | Status: DC | PRN

## 2020-06-13 MED ORDER — HYDRALAZINE HCL 20 MG/ML IJ SOLN
10.0000 mg | Freq: Four times a day (QID) | INTRAMUSCULAR | Status: DC | PRN
  Administered 2020-06-13 – 2020-06-14 (×2): 10 mg via INTRAVENOUS
  Filled 2020-06-13 (×2): qty 1

## 2020-06-13 MED ORDER — MORPHINE SULFATE (PF) 4 MG/ML IV SOLN
4.0000 mg | Freq: Once | INTRAVENOUS | Status: AC
  Administered 2020-06-13: 4 mg via INTRAVENOUS
  Filled 2020-06-13: qty 1

## 2020-06-13 MED ORDER — FLUTICASONE PROPIONATE 50 MCG/ACT NA SUSP
2.0000 | Freq: Every day | NASAL | Status: DC
  Filled 2020-06-13: qty 16

## 2020-06-13 MED ORDER — DULOXETINE HCL 20 MG PO CPEP
40.0000 mg | ORAL_CAPSULE | Freq: Every morning | ORAL | Status: DC
  Administered 2020-06-14 – 2020-06-20 (×7): 40 mg via ORAL
  Filled 2020-06-13 (×8): qty 2

## 2020-06-13 MED ORDER — CYCLOSPORINE 0.05 % OP EMUL: 2.0000 [drp] | Freq: Two times a day (BID) | OPHTHALMIC | Status: DC

## 2020-06-13 MED ORDER — BACLOFEN 10 MG PO TABS
10.0000 mg | ORAL_TABLET | Freq: Three times a day (TID) | ORAL | Status: DC
  Administered 2020-06-14 – 2020-06-20 (×20): 10 mg via ORAL
  Filled 2020-06-13 (×23): qty 1

## 2020-06-13 MED ORDER — PIPERACILLIN-TAZOBACTAM 3.375 G IVPB 30 MIN
3.3750 g | Freq: Once | INTRAVENOUS | Status: AC
  Administered 2020-06-13: 3.375 g via INTRAVENOUS
  Filled 2020-06-13: qty 50

## 2020-06-13 MED ORDER — MOMETASONE FURO-FORMOTEROL FUM 100-5 MCG/ACT IN AERO
2.0000 | INHALATION_SPRAY | Freq: Two times a day (BID) | RESPIRATORY_TRACT | Status: DC
  Administered 2020-06-13 – 2020-06-20 (×14): 2 via RESPIRATORY_TRACT
  Filled 2020-06-13: qty 8.8

## 2020-06-13 MED ORDER — INSULIN ASPART 100 UNIT/ML IJ SOLN
0.0000 [IU] | Freq: Every day | INTRAMUSCULAR | Status: DC
  Administered 2020-06-18: 2 [IU] via SUBCUTANEOUS
  Administered 2020-06-19: 3 [IU] via SUBCUTANEOUS
  Filled 2020-06-13 (×2): qty 1

## 2020-06-13 MED ORDER — SPIRONOLACTONE 25 MG PO TABS
25.0000 mg | ORAL_TABLET | Freq: Every day | ORAL | Status: DC
  Administered 2020-06-14 – 2020-06-20 (×7): 25 mg via ORAL
  Filled 2020-06-13 (×7): qty 1

## 2020-06-13 NOTE — ED Notes (Signed)
Patient transported to CT 

## 2020-06-13 NOTE — Progress Notes (Signed)
PHARMACY -  BRIEF ANTIBIOTIC NOTE   Pharmacy has received consult(s) for Zosyn from an ED provider.  The patient's profile has been reviewed for ht/wt/allergies/indication/available labs.    One time order(s) placed for Zosyn 3.375 grams IV x 1  Further antibiotics/pharmacy consults should be ordered by admitting physician if indicated.                       Thank you, Dallie Piles 06/13/2020  1:13 PM

## 2020-06-13 NOTE — ED Provider Notes (Signed)
[facility] Emergency Department Provider Note   ____________________________________________   Event Date/Time   First MD Initiated Contact with Patient 06/13/20 1047     (approximate)  I have reviewed the triage vital signs and the nursing notes.   HISTORY  Chief Complaint Abdominal Pain and Back Pain    HPI [name] is a 55 y.o. female history of diabetes, recent UTI, previous hernia surgeries, etc.  Was treated for urinary tract infection a couple weeks ago, reports symptoms of restarted having lower abdominal pain pain burning with urination, also today started having nausea and vomiting and a fairly sharp all over abdominal pain.  She also has had some loose stools and diarrhea as well  No chest pain or shortness of breath.  Reports she completed full course of antibiotic then saw her primary care doctor who called her and said it seems she still has an infection   Pain about 8 out of 10 abdomen.  Associated with vomiting this morning.  Also some slightly increased pain over her right lower pelvis compared to the left.  Reports history of kidney stones as well  Past Medical History:  Diagnosis Date  . Anxiety and depression 09/13/2006   Qualifier: Diagnosis of  By: Hassell Done [name], Tori Milks    . Arthritis    joint pain   . Asthma   . COPD (chronic obstructive pulmonary disease) (HCC)    chronic bronchitis   . Depression   . Diabetes mellitus    since age 38; type 2 IDDM  . Diabetic neuropathy, painful ([place])    FEET AND HANDS  . Diabetic retinopathy ([place])   . Diastolic dysfunction    a.  Echo 11/17: EF 60-65%, mild LVH, no RWMA, Gr1DD, mild AI, dilated aortic root measuring 38 mm, mildly dilated ascending aorta  . Dilated aortic root ([place])    71m by echo 11/2015  . Dizziness    secondary to diabetes and hypertension   . Epilepsy idiopathic petit mal ([place]    last seizure 2012;controlled w/ topomax  . GERD (gastroesophageal  reflux disease)   . Glaucoma    NOT ON ANY EYE DROPS   . Headache(784.0)    migraines   . Heart murmur    born with   . History of stress test    a. 11/17: Normal perfusion, EF 53%, normal study  . Hx of blood clots    hematomas removed from left side of brain from [id number]   . Hyperlipidemia   . Hypertension   . Legally blind    left eye   . MIGRAINE HEADACHE 09/14/2006   Qualifier: Diagnosis of  By: MarHassell DoneP, NykTori Milks . Myocardial infarction ([place]  a.  Patient reported history of without objective documentation  . Nephrolithiasis    frequent urination , urination at nite  PT SEEN IN ER 09/22/11 FOR BACK AND RT SIDED PAIN--HAS KNOWN STONE RT URETER AND UA IN ER SHOWED UTI  . Pain 09/23/11   LOWER BACK AND RIGHT SIDE--PT HAS RIGHT URETERAL STONE  . Pneumonia    hx of 2009  . Pyelonephritis   . Seizures ([facility]CC[facility] . Sleep apnea    [facility] ; mild  . Stress incontinence   . [facility]  [facility]  RESIDUAL LEFT LEG WEAKNESS--NO OTHER RESIDUAL PROBLEMS    Patient Active Problem List   Diagnosis Date  Noted  . PAD (peripheral artery disease) ([place]) 01/24/2020  . Leg pain 01/24/2020  . Leg pain, bilateral 01/11/2020  . Numbness and tingling 01/11/2020  . Abnormal ankle brachial index (ABI) 12/18/2019  . Hypoglycemia 05/17/2019  . Hypoglycemia associated with type 2 diabetes mellitus ([place]) 05/16/2019  . Chest pain 04/27/2019  . Type 2 diabetes mellitus without complication, with long-term current use of insulin ([place]) 04/27/2019  . COPD (chronic obstructive pulmonary disease) ([place]) 04/27/2019  . CAD (coronary artery disease) 04/27/2019  . H/O: stroke with residual effects   . Myalgia 04/19/2019  . 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 Non-intractable vomiting 03/01/2019  . History of CVA (cerebrovascular accident) 07/08/2018  . History of seizure 07/08/2018  . Diabetic polyneuropathy associated with type 2 diabetes mellitus  ([place]) 03/02/2018  . Obesity (BMI 30-39.9) 03/02/2018  . Vitamin D deficiency 01/24/2018  . Vulvovaginal candidiasis 01/24/2018  . Hypokalemia 06/08/2017  . Overactive bladder 05/18/2017  . Asthma 06/22/2016  . Dilated aortic root ([place])   . Sacroiliac dysfunction 10/09/2014  . Chronic low back pain 09/03/2014  . CVA, old, hemiparesis ([place]) 09/03/2014  . OSA (obstructive sleep apnea) 05/15/2014  . Restrictive airway disease 05/15/2014  . Colitis 02/22/2014  . Type 2 diabetes, uncontrolled, with renal manifestation ([place]) 01/21/2014  . Chronic pain syndrome 01/21/2014  . Headache disorder 09/07/2013  . Diabetic neuropathy ([place]) 05/23/2013  . Mixed urge and stress incontinence 08/25/2011  . Ureteral stricture, right 08/25/2011  . Pyelonephritis 08/22/2011  . Nephrolithiasis 08/22/2011  . Incisional hernia 05/10/2011  . Morbid obesity ([place]) 05/10/2011  . Tachycardia 05/08/2011  . BLINDNESS, [place] DEFINITION 09/14/2006  . Hyperlipemia 09/13/2006  . Essential hypertension 09/13/2006  . MYOCARDIAL INFARCTION, HX OF 09/13/2006  . GERD 09/13/2006  . Seizure disorder ([place]) 09/13/2006    Past Surgical History:  Procedure Laterality Date  . BRAIN HEMATOMA EVACUATION     five procedures total, first procedure when 96 months old, last at 55 years of age.  . CYSTOSCOPY W/ RETROGRADES  09/24/2011   Procedure: CYSTOSCOPY WITH RETROGRADE PYELOGRAM;  Surgeon: Malka So, MD;  Location: [facility];  Service: Urology;  Laterality: Right;  . CYSTOSCOPY WITH URETEROSCOPY  09/24/2011   Procedure: CYSTOSCOPY WITH URETEROSCOPY;  Surgeon: Malka So, MD;  Location: [facility];  Service: Urology;  Laterality: Right;  Balloon dilation right ureter   . CYSTOSCOPY/RETROGRADE/URETEROSCOPY  08/24/2011   Procedure: CYSTOSCOPY/RETROGRADE/URETEROSCOPY;  Surgeon: Molli Hazard, MD;  Location: [facility];  Service: Urology;  Laterality: Right;  Cysto, Right retrograde Pyelogram, right stent placement.   Fatima Blank HERNIA REPAIR  05/05/2011   Procedure: LAPAROSCOPIC INCISIONAL HERNIA;  Surgeon: Rolm Bookbinder, MD;  Location: [place];  Service: General;  Laterality: N/A;  . kidney stone removal    . MASS EXCISION  05/05/2011   Procedure: EXCISION MASS;  Surgeon: Rolm Bookbinder, MD;  Location: [place];  Service: General;  Laterality: Right;  . PCNL    . RETINAL DETACHMENT SURGERY Left 1990  . RIGHT/LEFT HEART CATH AND CORONARY ANGIOGRAPHY N/A 04/28/2019   Procedure: RIGHT/LEFT HEART CATH AND CORONARY ANGIOGRAPHY;  Surgeon: Yolonda Kida, MD;  Location: [place] [facility];  Service: Cardiovascular;  Laterality: N/A;  . SHUNT REMOVAL     shunt inserted at age 58 removed at age 51   . VAGINAL HYSTERECTOMY  1996    Prior to Admission medications   Medication Sig Start Date End Date Taking? Authorizing Provider  Accu-Chek Softclix Lancets lancets Use to  check blood sugar 4 times per day Dx code E11.9 Patient not taking: Reported on 06/04/2020 05/27/20   Elayne Snare, MD  albuterol (ACCUNEB) 1.25 MG/3ML nebulizer solution Inhale into the lungs. Use 3 mls by nebulization every 6 hours as needed for wheezing 01/26/19 01/26/20  [provider]  albuterol (PROAIR HFA) 108 (90 Base) MCG/ACT inhaler INHALE 2 PUFFS BY MOUTH EVERY 6 HOURS AS NEEDED FOR WHEEZING OR SHORTNESS OF BREATH 01/24/18   Hubbard Hartshorn, [name]  baclofen (LIORESAL) 10 MG tablet Take 1 tablet (10 mg total) by mouth 3 (three) times daily. 10/10/19   Towanda Malkin, MD  Blood Glucose Monitoring Suppl (ACCU-CHEK AVIVA PLUS) w/Device KIT Use to check blood sugar 4 times per day, DX code E11.9 Patient not taking: Reported on 06/04/2020 05/20/20   Elayne Snare, MD  Blood Glucose Monitoring Suppl (ACCU-CHEK GUIDE) w/Device KIT Use to check blood sugar 4 times per day Dx code E11.9 Patient not taking: Reported on 06/04/2020 05/20/20   Elayne Snare, MD  budesonide (PULMICORT) 0.5 MG/2ML nebulizer solution Inhale 0.5 mg into the lungs daily.  Take 2 mls by nebulizations once daily  01/26/19 01/26/20  [provider]  Calcium Carbonate-Vitamin D (CALCIUM-VITAMIN D3 PO) D3 500- calcium 630 mg Take 1 capsule by mouth once daily    [provider]  carvedilol (COREG) 12.5 MG tablet TAKE 1 TABLET (12.5 MG TOTAL) BY MOUTH 2 (TWO) TIMES DAILY AFTER A MEAL. 10/02/19 11/01/19  Towanda Malkin, MD  clopidogrel (PLAVIX) 75 MG tablet Take 1 tablet (75 mg total) by mouth daily. 10/10/19   Towanda Malkin, MD  Continuous Blood Gluc Sensor (FREESTYLE LIBRE 2 SENSOR) MISC Use 1 sensor every 14 days Patient not taking: Reported on 06/04/2020 02/19/20   Elayne Snare, MD  cycloSPORINE (RESTASIS) 0.05 % ophthalmic emulsion Place 2 drops into both eyes 2 (two) times daily.    [provider]  dapagliflozin propanediol (FARXIGA) 10 MG TABS tablet Take 1 tablet (10 mg total) by mouth daily before breakfast. 01/31/20   Elayne Snare, MD  Dulaglutide (TRULICITY) 3 WJ/1.9JY SOPN Inject 3 mg into the skin once a week. 02/29/20   Elayne Snare, MD  DULoxetine (CYMBALTA) 20 MG capsule TAKE 2 CAPSULES EVERY MORNING AND 1 CAPSULE EVERY NIGHT. 04/01/20   Delsa Grana, [name]  DUPIXENT 300 MG/2ML SOPN Inject 300 mg into the skin every 14 (fourteen) days. 04/04/19   [provider]  EPINEPHrine 0.3 mg/0.3 mL IJ SOAJ injection Inject 0.3 mg into the muscle as needed for anaphylaxis. 03/29/19   [provider]  ezetimibe (ZETIA) 10 MG tablet Take 1 tablet (10 mg total) by mouth daily. 04/18/19   Towanda Malkin, MD  fluticasone (FLONASE) 50 MCG/ACT nasal spray Place 2 sprays into both nostrils daily. 04/18/19   Towanda Malkin, MD  gabapentin (NEURONTIN) 600 MG tablet 600 mg in the morning, 600 mg in the afternoon, 1200 mg nightly. 05/09/20   Gillis Santa, MD  glucose blood (ACCU-CHEK GUIDE) test strip Use as instructed to check blood sugar 4 times per day Dx code E11.9 05/27/20   Elayne Snare, MD  Insulin Disposable Pump  (OMNIPOD DASH SYSTEM) KIT by Does not apply route.    [provider]  insulin lispro (HUMALOG) 100 UNIT/ML injection USE UP TO 150 UNITS/DAY IN INSULIN PUMP 05/07/20   Elayne Snare, MD  levETIRAcetam (KEPPRA) 500 MG tablet Take 500 mg by mouth 2 (two) times daily. 1 TAB IN THE MORNING AND .  5 TABS AT NIGHT 07/08/18   [provider]  losartan (COZAAR) 100 MG tablet Take 1 tablet (100 mg total) by mouth daily. 06/01/19   Delsa Grana, [name]  mirabegron ER (MYRBETRIQ) 50 MG TB24 tablet Take 1 tablet (50 mg total) by mouth daily. 04/18/19   Towanda Malkin, MD  nitroGLYCERIN (NITROSTAT) 0.4 MG SL tablet  11/18/19   [provider]  pantoprazole (PROTONIX) 40 MG tablet Take 1 tablet (40 mg total) by mouth daily. 10/10/19   Towanda Malkin, MD  polyethylene glycol (MIRALAX / GLYCOLAX) 17 g packet Take 17 g by mouth daily as needed for mild constipation. 06/01/19   Delsa Grana, [name]  potassium chloride SA (KLOR-CON M20) 20 MEQ tablet You have not needed potassium while in the hospital.  Hold until outpatient followup. 05/21/19   Enzo Bi, MD  simvastatin (ZOCOR) 40 MG tablet Take 40 mg by mouth daily.    [provider]  spironolactone (ALDACTONE) 25 MG tablet Take 1 tablet by mouth daily. 01/23/19   [provider]  terconazole (TERAZOL 7) 0.4 % vaginal cream Place 1 applicator vaginally at bedtime as needed (irritation). 03/06/20   Delsa Grana, [name]  WIXELA INHUB 250-50 MCG/DOSE AEPB Inhale 1 puff into the lungs 2 (two) times daily. 01/22/20   [provider]    Allergies Codeine and Lactose intolerance (gi)  Family History  Problem Relation Age of Onset  . Uterine cancer Mother   . Hypertension Mother   . Cancer Mother   . Brain cancer Maternal Grandmother   . Hypertension Maternal Grandmother   . Birth defects Daughter   . Hypertension Daughter   . Cirrhosis Maternal Grandfather   . ADD / ADHD Maternal Grandfather   . Birth defects  Maternal Grandfather   . Diabetes Maternal Grandfather   . Anesthesia problems Neg Hx   . Colon cancer Neg Hx   . Esophageal cancer Neg Hx   . Pancreatic cancer Neg Hx     Social History Social History   Tobacco Use  . Smoking status: Former Smoker    Packs/day: 1.00    Years: 15.00    Pack years: 15.00    Types: Cigarettes    Quit date: 01/20/1987    Years since quitting: 33.4  . Smokeless tobacco: Never Used  . Tobacco comment: quit more than 20 years  Vaping Use  . Vaping Use: Never used  Substance Use Topics  . Alcohol use: Never    Alcohol/week: 0.0 standard drinks  . Drug use: No    Comment: hx of marijuana use no longer uses     Review of Systems Constitutional: No fever/chills Eyes: No visual changes. ENT: No sore throat. Cardiovascular: Denies chest pain. Respiratory: Denies shortness of breath. Gastrointestinal: See HPI Genitourinary: See HPI Musculoskeletal: Negative for back pain but it does seem to radiate a little bit of the pain from her lower pelvis towards her lower back bilateral. Skin: Negative for rash. Neurological: Negative for headaches, areas of focal weakness or numbness.    ____________________________________________   PHYSICAL EXAM:  VITAL SIGNS: ED Triage Vitals  Enc Vitals Group     BP 06/13/20 0821 (!) 170/119     Pulse Rate 06/13/20 0821 (!) 103     Resp 06/13/20 0821 18     Temp 06/13/20 0821 98.8 F (37.1 C)     Temp Source 06/13/20 0821 Oral     SpO2 06/13/20 0821 100 %     Weight  06/13/20 0822 251 lb (113.9 kg)     Height 06/13/20 0822 5' 5"  (1.651 m)     Head Circumference --      Peak Flow --      Pain Score 06/13/20 0822 10     Pain Loc --      Pain Edu? --      Excl. in [place]? --     Constitutional: Alert and oriented.  Peers somewhat uncomfortable and in pain hands of her abdomen, appears fatigued but in no acute extremitas Eyes: Conjunctivae are normal. Head: Atraumatic. Nose: No  congestion/rhinnorhea. Mouth/Throat: Mucous membranes are moist. Neck: No stridor.  Cardiovascular: Normal rate, regular rhythm. Grossly normal heart sounds.  Good peripheral circulation. Respiratory: Normal respiratory effort.  No retractions. Lungs CTAB. Gastrointestinal: Soft and moderate tenderness throughout, perhaps more so in the mid to lower abdomen.  Query slight distention.  No obvious or apparent hernias except for a soft reducible umbilical hernia. Musculoskeletal: No lower extremity tenderness nor edema. Neurologic:  Normal speech and language. No gross focal neurologic deficits are appreciated.  Skin:  Skin is warm, dry and intact. No rash noted. Psychiatric: Mood and affect are normal. Speech and behavior are normal.  ____________________________________________   LABS (all labs ordered are listed, but only abnormal results are displayed)  Labs Reviewed  COMPREHENSIVE METABOLIC PANEL - Abnormal; Notable for the following components:      Result Value   Glucose, Bld 150 (*)    Total Protein 8.7 (*)    All other components within normal limits  CBC - Abnormal; Notable for the following components:   RBC 5.13 (*)    All other components within normal limits  URINALYSIS, COMPLETE (UACMP) WITH MICROSCOPIC - Abnormal; Notable for the following components:   Color, Urine YELLOW (*)    APPearance CLOUDY (*)    Hgb urine dipstick MODERATE (*)    Protein, ur 100 (*)    Nitrite POSITIVE (*)    Leukocytes,Ua LARGE (*)    WBC, UA >50 (*)    Bacteria, UA MANY (*)    All other components within normal limits  URINE CULTURE  RESP PANEL BY RT-PCR (FLU A&B, COVID) ARPGX2  LIPASE, BLOOD   ____________________________________________  EKG   ____________________________________________  RADIOLOGY  CT Renal Stone Study  Result Date: 06/13/2020 CLINICAL DATA:  Urinary tract stone. EXAM: CT ABDOMEN AND PELVIS WITHOUT CONTRAST TECHNIQUE: Multidetector CT imaging of the abdomen  and pelvis was performed following the standard protocol without IV contrast. COMPARISON:  05/26/2020 FINDINGS: Lower chest:  Mild atelectasis at the bases Hepatobiliary: No focal liver abnormality.No evidence of biliary obstruction or stone. Pancreas: Unremarkable. Spleen: Unremarkable. Adrenals/Urinary Tract: 2.7 cm low-density right adrenal mass consistent with adenoma. No hydronephrosis or ureteral stone. Multiple renal calculi, numbering at least 7 on the right and solitary on the left. Lower pole renal cortical thinning on the right is attributed to the scar. The largest stone is measured at 6 mm in the right upper pole. Unremarkable bladder. Stomach/Bowel: Dilated and fluid-filled small bowel loops with collapsed ileal loops although a specific transition point is not clearly identified. The colon is relatively decompressed. Distal colonic diverticulosis. No bowel wall thickening. Vascular/Lymphatic: No acute vascular abnormality. No mass or adenopathy. Reproductive:Hysterectomy Other: No ascites or pneumoperitoneum. Musculoskeletal: No acute abnormalities. IMPRESSION: 1. Pattern of partial small bowel obstruction although a discrete transition point is not identified. 2. No hydronephrosis or ureteral calculus. 3. Right more than left nonobstructing renal calculi. Electronically Signed  By: Monte Fantasia M.D.   On: 06/13/2020 12:05    CT imaging reviewed, concerning for possible small bowel obstruction, also noted are renal calculi ____________________________________________   PROCEDURES  Procedure(s) performed: None  Procedures  Critical Care performed: No  ____________________________________________   INITIAL IMPRESSION / ASSESSMENT AND PLAN / ED COURSE  Pertinent labs & imaging results that were available during my care of the patient were reviewed by me and considered in my medical decision making (see chart for details).   Differential diagnosis includes but is not limited to,  abdominal perforation, aortic dissection, cholecystitis, appendicitis, diverticulitis, colitis, esophagitis/gastritis, kidney stone, pyelonephritis, urinary tract infection, aortic aneurysm. All are considered in decision and treatment plan. Based upon the patient's presentation and risk factors, proceed with imaging as well as labs and urinalysis ----------------------------------------- 1:01 PM on 06/13/2020 -----------------------------------------   Reviewed previous urinalysis, appears sensitive to Zosyn.  Will initiate Zosyn here for what appears to be a resistant urinary tract infection.  Patient has received morphine with improvement but now pain is returned, will give additional bolus of morphine.  Patient being rehydrated.  Clinically I do not think she necessarily has a small bowel obstruction, would consider possible ileus, however will admit for further care management serial exams and have consulted general surgery Dr. Dahlia Byes who will also see the patient today for further surgery input on possible SBO.  Discussed with the patient as well as her 3 daughters who she invited to her care, and all are understanding agreeable with plan for admission for further treatment of UTI and possible bowel obstruction      ____________________________________________   FINAL CLINICAL IMPRESSION(S) / ED DIAGNOSES  Final diagnoses:  Lower urinary tract infection, acute  Non-intractable vomiting with nausea, unspecified vomiting type        Note:  This document was prepared using Dragon voice recognition software and may include unintentional dictation errors       Delman Kitten, MD 06/13/20 1302

## 2020-06-13 NOTE — Consult Note (Signed)
Patient ID: [name], female   DOB: 1965/12/02, 55 y.o.   MRN: [id number]  HPI [name] is a 55 y.o. female seen in consultation at the request of Dr. Jacqualine Code for abdominal pain nausea and vomiting as well as diarrhea.  She stated that over the last couple of days started having some abdominal pain is moderate, intermittent without a specific albuterol factors.  She did have nausea and vomiting.  She is having multiple bowel movements.  Had a prior history of open ventral hernia repair by Dr. Donne Hazel.  Has multiple comorbidities including morbid obesity, diabetes, COPD. The scan personally reviewed there is mild elevation of the small bowel and the stomach.  There is no pneumatosis there is no free air.  No definitive transition points.  Decompressed colon.  He receives normal.  CMP and lipase is normal.  Urine anAlysis consistent with UTI  HPI  Past Medical History:  Diagnosis Date  . Anxiety and depression 09/13/2006   Qualifier: Diagnosis of  By: Hassell Done [name], Tori Milks    . Arthritis    joint pain   . Asthma   . COPD (chronic obstructive pulmonary disease) (HCC)    chronic bronchitis   . Depression   . Diabetes mellitus    since age 72; type 2 IDDM  . Diabetic neuropathy, painful ([place])    FEET AND HANDS  . Diabetic retinopathy ([place])   . Diastolic dysfunction    a.  Echo 11/17: EF 60-65%, mild LVH, no RWMA, Gr1DD, mild AI, dilated aortic root measuring 38 mm, mildly dilated ascending aorta  . Dilated aortic root ([place])    38m by echo 11/2015  . Dizziness    secondary to diabetes and hypertension   . Epile[place] idiopathic [place]    last seizure 2012;controlled w/ topomax  . GERD (gastroesophageal reflux disease)   . Glaucoma    NOT ON ANY EYE DROPS   . Headache(784.0)    migraines   . Heart murmur    born with   . History of stress test    a. 11/17: Normal perfusion, EF 53%, normal study  . Hx of blood clots    hematomas removed from left side of  brain from [id number]   . Hyperlipidemia   . Hypertension   . Legally blind    left eye   . MIGRAINE HEADACHE 09/14/2006   Qualifier: Diagnosis of  By: MarHassell DoneP, NykTori Milks . [facility] ([facility]  a.  Patient reported history of without objective documentation  . Nephrolithiasis    frequent urination , urination at nite  PT SEEN IN ER 09/22/11 FOR BACK AND RT SIDED PAIN--HAS KNOWN STONE RT URETER AND UA IN ER SHOWED UTI  . Pain 09/23/11   LOWER BACK AND RIGHT SIDE--PT HAS RIGHT URETERAL STONE  . Pneumonia    hx of 2009  . Pyelonephritis   . Seizures ([place] . Sleep apnea    [facility] ; mild  . Stress incontinence   . [facility]  [facility]  RESIDUAL LEFT LEG WEAKNESS--NO OTHER RESIDUAL PROBLEMS    Past Surgical History:  Procedure Laterality Date  . BRAIN HEMATOMA EVACUATION     five procedures total, first procedure when 10 35 monthsd, last at 4 y34ars of age.  . CYSTOSCOPY W/ RETROGRADES  09/24/2011   Procedure: CYSTOSCOPY WITH RETROGRADE PYELOGRAM;  Surgeon: JohMalka So  MD;  Location: [facility];  Service: Urology;  Laterality: Right;  . CYSTOSCOPY WITH URETEROSCOPY  09/24/2011   Procedure: CYSTOSCOPY WITH URETEROSCOPY;  Surgeon: Malka So, MD;  Location: [facility];  Service: Urology;  Laterality: Right;  Balloon dilation right ureter   . CYSTOSCOPY/RETROGRADE/URETEROSCOPY  08/24/2011   Procedure: CYSTOSCOPY/RETROGRADE/URETEROSCOPY;  Surgeon: Molli Hazard, MD;  Location: [facility];  Service: Urology;  Laterality: Right;  Cysto, Right retrograde Pyelogram, right stent placement.   Fatima Blank HERNIA REPAIR  05/05/2011   Procedure: LAPAROSCOPIC INCISIONAL HERNIA;  Surgeon: Rolm Bookbinder, MD;  Location: [place];  Service: General;  Laterality: N/A;  . kidney stone removal    . MASS EXCISION  05/05/2011   Procedure: EXCISION MASS;  Surgeon: Rolm Bookbinder, MD;  Location: [place];  Service: General;  Laterality: Right;  .  PCNL    . RETINAL DETACHMENT SURGERY Left 1990  . RIGHT/LEFT HEART CATH AND CORONARY ANGIOGRAPHY N/A 04/28/2019   Procedure: RIGHT/LEFT HEART CATH AND CORONARY ANGIOGRAPHY;  Surgeon: Yolonda Kida, MD;  Location: [place] [facility];  Service: Cardiovascular;  Laterality: N/A;  . SHUNT REMOVAL     shunt inserted at age 1 removed at age 61   . VAGINAL HYSTERECTOMY  1996    Family History  Problem Relation Age of Onset  . Uterine cancer Mother   . Hypertension Mother   . Cancer Mother   . Brain cancer Maternal Grandmother   . Hypertension Maternal Grandmother   . Birth defects Daughter   . Hypertension Daughter   . Cirrhosis Maternal Grandfather   . ADD / ADHD Maternal Grandfather   . Birth defects Maternal Grandfather   . Diabetes Maternal Grandfather   . Anesthesia problems Neg Hx   . Colon cancer Neg Hx   . Esophageal cancer Neg Hx   . Pancreatic cancer Neg Hx     Social History Social History   Tobacco Use  . Smoking status: Former Smoker    Packs/day: 1.00    Years: 15.00    Pack years: 15.00    Types: Cigarettes    Quit date: 01/20/1987    Years since quitting: 33.4  . Smokeless tobacco: Never Used  . Tobacco comment: quit more than 20 years  Vaping Use  . Vaping Use: Never used  Substance Use Topics  . Alcohol use: Never    Alcohol/week: 0.0 standard drinks  . Drug use: No    Comment: hx of marijuana use no longer uses     Allergies  Allergen Reactions  . Codeine Swelling and Other (See Comments)    Swelling and burning of mouth (inside)  . Lactose Intolerance (Gi) Nausea And Vomiting    Current Facility-Administered Medications  Medication Dose Route Frequency Provider Last Rate Last Admin  . piperacillin-tazobactam (ZOSYN) IVPB 3.375 g  3.375 g Intravenous Once Dallie Piles, RPH 100 mL/hr at 06/13/20 1331 3.375 g at 06/13/20 1331   Current Outpatient Medications  Medication Sig Dispense Refill  . Accu-Chek Softclix Lancets lancets Use to  check blood sugar 4 times per day Dx code E11.9 (Patient not taking: Reported on 06/04/2020) 400 each 1  . albuterol (ACCUNEB) 1.25 MG/3ML nebulizer solution Inhale into the lungs. Use 3 mls by nebulization every 6 hours as needed for wheezing    . albuterol (PROAIR HFA) 108 (90 Base) MCG/ACT inhaler INHALE 2 PUFFS BY MOUTH EVERY 6 HOURS AS NEEDED FOR WHEEZING OR SHORTNESS OF BREATH 3 Inhaler 3  . baclofen (LIORESAL)  10 MG tablet Take 1 tablet (10 mg total) by mouth 3 (three) times daily. 90 tablet 6  . Blood Glucose Monitoring Suppl (ACCU-CHEK AVIVA PLUS) w/Device KIT Use to check blood sugar 4 times per day, DX code E11.9 (Patient not taking: Reported on 06/04/2020) 1 kit 0  . Blood Glucose Monitoring Suppl (ACCU-CHEK GUIDE) w/Device KIT Use to check blood sugar 4 times per day Dx code E11.9 (Patient not taking: Reported on 06/04/2020) 1 kit 0  . budesonide (PULMICORT) 0.5 MG/2ML nebulizer solution Inhale 0.5 mg into the lungs daily. Take 2 mls by nebulizations once daily     . Calcium Carbonate-Vitamin D (CALCIUM-VITAMIN D3 PO) D3 500- calcium 630 mg Take 1 capsule by mouth once daily    . carvedilol (COREG) 12.5 MG tablet TAKE 1 TABLET (12.5 MG TOTAL) BY MOUTH 2 (TWO) TIMES DAILY AFTER A MEAL. 180 tablet 1  . clopidogrel (PLAVIX) 75 MG tablet Take 1 tablet (75 mg total) by mouth daily. 90 tablet 0  . Continuous Blood Gluc Sensor (FREESTYLE LIBRE 2 SENSOR) MISC Use 1 sensor every 14 days (Patient not taking: Reported on 06/04/2020) 6 each 1  . cycloSPORINE (RESTASIS) 0.05 % ophthalmic emulsion Place 2 drops into both eyes 2 (two) times daily.    . dapagliflozin propanediol (FARXIGA) 10 MG TABS tablet Take 1 tablet (10 mg total) by mouth daily before breakfast. 30 tablet 2  . Dulaglutide (TRULICITY) 3 PO/2.4MP SOPN Inject 3 mg into the skin once a week. 2 mL 2  . DULoxetine (CYMBALTA) 20 MG capsule TAKE 2 CAPSULES EVERY MORNING AND 1 CAPSULE EVERY NIGHT. 270 capsule 1  . DUPIXENT 300 MG/2ML SOPN Inject  300 mg into the skin every 14 (fourteen) days.    [name] EPINEPHrine 0.3 mg/0.3 mL IJ SOAJ injection Inject 0.3 mg into the muscle as needed for anaphylaxis.    [name] ezetimibe (ZETIA) 10 MG tablet Take 1 tablet (10 mg total) by mouth daily. 90 tablet 3  . fluticasone (FLONASE) 50 MCG/ACT nasal spray Place 2 sprays into both nostrils daily. 16 g 3  . gabapentin (NEURONTIN) 600 MG tablet 600 mg in the morning, 600 mg in the afternoon, 1200 mg nightly. 120 tablet 2  . glucose blood (ACCU-CHEK GUIDE) test strip Use as instructed to check blood sugar 4 times per day Dx code E11.9 400 strip 3  . Insulin Disposable Pump (OMNIPOD DASH SYSTEM) KIT by Does not apply route.    . insulin lispro (HUMALOG) 100 UNIT/ML injection USE UP TO 150 UNITS/DAY IN INSULIN PUMP 110 mL 1  . levETIRAcetam (KEPPRA) 500 MG tablet Take 500 mg by mouth 2 (two) times daily. 1 TAB IN THE MORNING AND .5 TABS AT NIGHT    . losartan (COZAAR) 100 MG tablet Take 1 tablet (100 mg total) by mouth daily. 90 tablet 3  . mirabegron ER (MYRBETRIQ) 50 MG TB24 tablet Take 1 tablet (50 mg total) by mouth daily. 90 tablet 1  . nitroGLYCERIN (NITROSTAT) 0.4 MG SL tablet     . pantoprazole (PROTONIX) 40 MG tablet Take 1 tablet (40 mg total) by mouth daily. 90 tablet 0  . polyethylene glycol (MIRALAX / GLYCOLAX) 17 g packet Take 17 g by mouth daily as needed for mild constipation. 100 each 3  . potassium chloride SA (KLOR-CON M20) 20 MEQ tablet You have not needed potassium while in the hospital.  Hold until outpatient followup. 60 tablet 5  . simvastatin (ZOCOR) 40 MG tablet Take 40 mg by  mouth daily.    [name] spironolactone (ALDACTONE) 25 MG tablet Take 1 tablet by mouth daily.    [name] terconazole (TERAZOL 7) 0.4 % vaginal cream Place 1 applicator vaginally at bedtime as needed (irritation). 45 g 0  . WIXELA INHUB 250-50 MCG/DOSE AEPB Inhale 1 puff into the lungs 2 (two) times daily.       Review of Systems Full ROS  was asked and was negative except for  the information on the HPI  Physical Exam Blood pressure (!) 170/119, pulse (!) 103, temperature 98.8 F (37.1 C), temperature source Oral, resp. rate 18, height 5' 5"  (1.651 m), weight 113.9 kg, SpO2 100 %. CONSTITUTIONAL: NAD EYES: Pupils are equal, round,Sclera are non-icteric. EARS, NOSE, MOUTH AND THROAT: wearing a mask.Hearing is intact to voice. LYMPH NODES:  Lymph nodes in the neck are normal. RESPIRATORY:  Lungs are clear. There is normal respiratory effort, with equal breath sounds bilaterally, and without pathologic use of accessory muscles. CARDIOVASCULAR: Heart is regular without murmurs, gallops, or rubs. GI: The abdomen is  soft, midlly distended. There are no palpable masses.  mild diffuse tenderness. There are normal bowel sounds in all quadrants. GU: Rectal deferred.   MUSCULOSKELETAL: Normal muscle strength and tone. No cyanosis or edema.   SKIN: Turgor is good and there are no pathologic skin lesions or ulcers. NEUROLOGIC: Motor and sensation is grossly normal. Cranial nerves are grossly intact. PSYCH:  Oriented to person, place and time. Affect is normal.  Data Reviewed  I have personally reviewed the patient's imaging, laboratory findings and medical records.    Assessment/Plan 55 yo female with abdominal pain, N/V and diarrhea.  She does have UTI. Clinincal picture consistent with enteritis.  She does have a UTI. No  Need for surgical intervention.  At some point time she will need work-up for adrenal adenoma/incidentaloma.  We will continue to follow and get an x-ray in the morning. I Agree w A/bs, IVF and serial abdominal exams. Case d/w Dr. Jacqualine Code in detail.   Caroleen Hamman, MD FACS General Surgeon 06/13/2020, 1:38 PM

## 2020-06-13 NOTE — Consult Note (Signed)
Pharmacy Antibiotic Note  Amariss Detamore is a 55 y.o. female with h/o morbid obesity c/b OSA, DM, HTN, HLD, diaCHF, seizure, stroke, & COPD admitted on 06/13/2020 with intra-abdominal infxn and further evaluation of N/V/Dh.  Pharmacy has been consulted for Zosyn dosing.  Plan: Zosyn 3.375g x1 in ED; then Zosyn 3.375g IV q8h (4 hour infusion). at next dosing interval.  Height: 5' 5"  (165.1 cm) Weight: 113.9 kg (251 lb) IBW/kg (Calculated) : 57  Temp (24hrs), Avg:98.8 F (37.1 C), Min:98.8 F (37.1 C), Max:98.8 F (37.1 C)  Recent Labs  Lab 06/10/20 1101 06/13/20 0819  WBC  --  10.2  CREATININE 1.17 0.87    Estimated Creatinine Clearance: 92 mL/min (by C-G formula based on SCr of 0.87 mg/dL).    Allergies  Allergen Reactions  . Codeine Swelling and Other (See Comments)    Swelling and burning of mouth (inside)  . Lactose Intolerance (Gi) Nausea And Vomiting    Antimicrobials this admission: Zosyn (5/26 >>   Dose adjustments this admission: N/A  Microbiology results: 5/26 UCx: sent/pending  5/26 flu/Cov: negative  Thank you for allowing pharmacy to be a part of this patient's care.  Shanon Brow Jakub Debold 06/13/2020 2:30 PM

## 2020-06-13 NOTE — Progress Notes (Signed)
Pt's home cpap unit is in good working order according to the pt. It does not have frays in the cord.

## 2020-06-13 NOTE — ED Notes (Signed)
Zach RN aware of assigned bed

## 2020-06-13 NOTE — H&P (Addendum)
[facility] History and Physical  [name] XVQ:[id number] DOB: 10/28/1965 DOA: 06/13/2020  Referring physician: ED  PCP: Delsa Grana, [name]   Patient is coming from: Home  Chief Complaint: Nausea, vomiting abdominal pain.  HPI: [name] is a 55 y.o. female with history of COPD, diabetes, hypertension, anxiety, recent UTI treated couple of weeks back with antibiotics including Rocephin and Keflex completed last week, presented to hospital with nausea vomiting and abdominal pain with some loose stools.  Patient also complained of flank pain around 7/ 10 in intensity and burning sensation while urinating with dysuria and frequency..  Patient does have history of kidney stones.  Patient denied any shortness of breath,  fever or chills at this time but has history of reactive airway disease.  She does have some cough with mild phlegm production.  Patient has been complaining of loose watery stools for the last 2 days 5-6 times.  She also had multiple episodes of vomiting this morning around 5-7 times when she thought of calling EMS.  ED Course: In the ED, patient morphine 4 mg twice, Zofran IV, Zosyn 3.375 mg and normal saline 1000 ml bolus and the patient was considered for admission to the hospital.  CT renal study done in the ED showed pattern of partial small bowel obstruction though no transition point was noted.  Patient was then admitted to hospital for further evaluation and treatment.  Review of Systems:  All systems were reviewed and were negative unless otherwise mentioned in the HPI  Past Medical History:  Diagnosis Date  . Anxiety and depression 09/13/2006   Qualifier: Diagnosis of  By: Hassell Done [name], Tori Milks    . Arthritis    joint pain   . Asthma   . COPD (chronic obstructive pulmonary disease) (HCC)    chronic bronchitis   . Depression   . Diabetes mellitus    since age 3; type 2 IDDM  . Diabetic neuropathy, painful ([place])    FEET AND HANDS  .  Diabetic retinopathy ([place])   . Diastolic dysfunction    a.  Echo 11/17: EF 60-65%, mild LVH, no RWMA, Gr1DD, mild AI, dilated aortic root measuring 38 mm, mildly dilated ascending aorta  . Dilated aortic root ([place])    82m by echo 11/2015  . Dizziness    secondary to diabetes and hypertension   . Epilepsy idiopathic petit mal ([place]    last seizure 2012;controlled w/ topomax  . GERD (gastroesophageal reflux disease)   . Glaucoma    NOT ON ANY EYE DROPS   . Headache(784.0)    migraines   . Heart murmur    born with   . History of stress test    a. 11/17: Normal perfusion, EF 53%, normal study  . Hx of blood clots    hematomas removed from left side of brain from [id number]   . Hyperlipidemia   . Hypertension   . Legally blind    left eye   . MIGRAINE HEADACHE 09/14/2006   Qualifier: Diagnosis of  By: MarHassell DoneP, NykTori Milks . Myocardial infarction ([place]  a.  Patient reported history of without objective documentation  . Nephrolithiasis    frequent urination , urination at nite  PT SEEN IN ER 09/22/11 FOR BACK AND RT SIDED PAIN--HAS KNOWN STONE RT URETER AND UA IN ER SHOWED UTI  . Pain 09/23/11   LOWER BACK AND RIGHT SIDE--PT HAS RIGHT URETERAL STONE  . Pneumonia  hx of 2009  . Pyelonephritis   . Seizures ([place])   . Sleep apnea    [facility] not [facility] ; mild  . Stress incontinence   . Stroke [facility])    last 2003  RESIDUAL LEFT LEG WEAKNESS--NO OTHER RESIDUAL PROBLEMS   Past Surgical History:  Procedure Laterality Date  . BRAIN HEMATOMA EVACUATION     five procedures total, first procedure when 63 months old, last at 55 years of age.  . CYSTOSCOPY W/ RETROGRADES  09/24/2011   Procedure: CYSTOSCOPY WITH RETROGRADE PYELOGRAM;  Surgeon: Malka So, MD;  Location: [facility];  Service: Urology;  Laterality: Right;  . CYSTOSCOPY WITH URETEROSCOPY  09/24/2011   Procedure: CYSTOSCOPY WITH URETEROSCOPY;  Surgeon: Malka So, MD;  Location: [facility];   Service: Urology;  Laterality: Right;  Balloon dilation right ureter   . CYSTOSCOPY/RETROGRADE/URETEROSCOPY  08/24/2011   Procedure: CYSTOSCOPY/RETROGRADE/URETEROSCOPY;  Surgeon: Molli Hazard, MD;  Location: [facility];  Service: Urology;  Laterality: Right;  Cysto, Right retrograde Pyelogram, right stent placement.   Fatima Blank HERNIA REPAIR  05/05/2011   Procedure: LAPAROSCOPIC INCISIONAL HERNIA;  Surgeon: Rolm Bookbinder, MD;  Location: [place];  Service: General;  Laterality: N/A;  . kidney stone removal    . MASS EXCISION  05/05/2011   Procedure: EXCISION MASS;  Surgeon: Rolm Bookbinder, MD;  Location: [place];  Service: General;  Laterality: Right;  . PCNL    . RETINAL DETACHMENT SURGERY Left 1990  . RIGHT/LEFT HEART CATH AND CORONARY ANGIOGRAPHY N/A 04/28/2019   Procedure: RIGHT/LEFT HEART CATH AND CORONARY ANGIOGRAPHY;  Surgeon: Yolonda Kida, MD;  Location: [place] [facility];  Service: Cardiovascular;  Laterality: N/A;  . SHUNT REMOVAL     shunt inserted at age 76 removed at age 72   . VAGINAL HYSTERECTOMY  1996    Social History:  reports that she quit smoking about 33 years ago. Her smoking use included cigarettes. She has a 15.00 pack-year smoking history. She has never used smokeless tobacco. She reports that she does not drink alcohol and does not use drugs.  Allergies  Allergen Reactions  . Codeine Swelling and Other (See Comments)    Swelling and burning of mouth (inside)  . Lactose Intolerance (Gi) Nausea And Vomiting    Family History  Problem Relation Age of Onset  . Uterine cancer Mother   . Hypertension Mother   . Cancer Mother   . Brain cancer Maternal Grandmother   . Hypertension Maternal Grandmother   . Birth defects Daughter   . Hypertension Daughter   . Cirrhosis Maternal Grandfather   . ADD / ADHD Maternal Grandfather   . Birth defects Maternal Grandfather   . Diabetes Maternal Grandfather   . Anesthesia problems Neg Hx   . Colon cancer Neg  Hx   . Esophageal cancer Neg Hx   . Pancreatic cancer Neg Hx      Prior to Admission medications   Medication Sig Start Date End Date Taking? Authorizing Provider  Accu-Chek Softclix Lancets lancets Use to check blood sugar 4 times per day Dx code E11.9 Patient not taking: Reported on 06/04/2020 05/27/20   Elayne Snare, MD  albuterol (ACCUNEB) 1.25 MG/3ML nebulizer solution Inhale into the lungs. Use 3 mls by nebulization every 6 hours as needed for wheezing 01/26/19 01/26/20  [provider]  albuterol (PROAIR HFA) 108 (90 Base) MCG/ACT inhaler INHALE 2 PUFFS BY MOUTH EVERY 6 HOURS AS NEEDED FOR WHEEZING OR  SHORTNESS OF BREATH 01/24/18   Hubbard Hartshorn, [name]  baclofen (LIORESAL) 10 MG tablet Take 1 tablet (10 mg total) by mouth 3 (three) times daily. 10/10/19   Towanda Malkin, MD  Blood Glucose Monitoring Suppl (ACCU-CHEK AVIVA PLUS) w/Device KIT Use to check blood sugar 4 times per day, DX code E11.9 Patient not taking: Reported on 06/04/2020 05/20/20   Elayne Snare, MD  Blood Glucose Monitoring Suppl (ACCU-CHEK GUIDE) w/Device KIT Use to check blood sugar 4 times per day Dx code E11.9 Patient not taking: Reported on 06/04/2020 05/20/20   Elayne Snare, MD  budesonide (PULMICORT) 0.5 MG/2ML nebulizer solution Inhale 0.5 mg into the lungs daily. Take 2 mls by nebulizations once daily  01/26/19 01/26/20  [provider]  Calcium Carbonate-Vitamin D (CALCIUM-VITAMIN D3 PO) D3 500- calcium 630 mg Take 1 capsule by mouth once daily    [provider]  carvedilol (COREG) 12.5 MG tablet TAKE 1 TABLET (12.5 MG TOTAL) BY MOUTH 2 (TWO) TIMES DAILY AFTER A MEAL. 10/02/19 11/01/19  Towanda Malkin, MD  clopidogrel (PLAVIX) 75 MG tablet Take 1 tablet (75 mg total) by mouth daily. 10/10/19   Towanda Malkin, MD  Continuous Blood Gluc Sensor (FREESTYLE LIBRE 2 SENSOR) MISC Use 1 sensor every 14 days Patient not taking: Reported on 06/04/2020 02/19/20   Elayne Snare, MD  cycloSPORINE  (RESTASIS) 0.05 % ophthalmic emulsion Place 2 drops into both eyes 2 (two) times daily.    [provider]  dapagliflozin propanediol (FARXIGA) 10 MG TABS tablet Take 1 tablet (10 mg total) by mouth daily before breakfast. 01/31/20   Elayne Snare, MD  Dulaglutide (TRULICITY) 3 PJ/0.9TO SOPN Inject 3 mg into the skin once a week. 02/29/20   Elayne Snare, MD  DULoxetine (CYMBALTA) 20 MG capsule TAKE 2 CAPSULES EVERY MORNING AND 1 CAPSULE EVERY NIGHT. 04/01/20   Delsa Grana, [name]  DUPIXENT 300 MG/2ML SOPN Inject 300 mg into the skin every 14 (fourteen) days. 04/04/19   [provider]  EPINEPHrine 0.3 mg/0.3 mL IJ SOAJ injection Inject 0.3 mg into the muscle as needed for anaphylaxis. 03/29/19   [provider]  ezetimibe (ZETIA) 10 MG tablet Take 1 tablet (10 mg total) by mouth daily. 04/18/19   Towanda Malkin, MD  fluticasone (FLONASE) 50 MCG/ACT nasal spray Place 2 sprays into both nostrils daily. 04/18/19   Towanda Malkin, MD  gabapentin (NEURONTIN) 600 MG tablet 600 mg in the morning, 600 mg in the afternoon, 1200 mg nightly. 05/09/20   Gillis Santa, MD  glucose blood (ACCU-CHEK GUIDE) test strip Use as instructed to check blood sugar 4 times per day Dx code E11.9 05/27/20   Elayne Snare, MD  Insulin Disposable Pump (OMNIPOD DASH SYSTEM) KIT by Does not apply route.    [provider]  insulin lispro (HUMALOG) 100 UNIT/ML injection USE UP TO 150 UNITS/DAY IN INSULIN PUMP 05/07/20   Elayne Snare, MD  levETIRAcetam (KEPPRA) 500 MG tablet Take 500 mg by mouth 2 (two) times daily. 1 TAB IN THE MORNING AND .5 TABS AT NIGHT 07/08/18   [provider]  losartan (COZAAR) 100 MG tablet Take 1 tablet (100 mg total) by mouth daily. 06/01/19   Delsa Grana, [name]  mirabegron ER (MYRBETRIQ) 50 MG TB24 tablet Take 1 tablet (50 mg total) by mouth daily. 04/18/19   Towanda Malkin, MD  nitroGLYCERIN (NITROSTAT) 0.4 MG SL tablet  11/18/19   [provider]  pantoprazole (PROTONIX) 40  MG tablet Take 1 tablet (40 mg total) by mouth daily. 10/10/19   Towanda Malkin, MD  polyethylene glycol (MIRALAX / GLYCOLAX) 17 g packet Take 17 g by mouth daily as needed for mild constipation. 06/01/19   Delsa Grana, [name]  potassium chloride SA (KLOR-CON M20) 20 MEQ tablet You have not needed potassium while in the hospital.  Hold until outpatient followup. 05/21/19   Enzo Bi, MD  simvastatin (ZOCOR) 40 MG tablet Take 40 mg by mouth daily.    [provider]  spironolactone (ALDACTONE) 25 MG tablet Take 1 tablet by mouth daily. 01/23/19   [provider]  terconazole (TERAZOL 7) 0.4 % vaginal cream Place 1 applicator vaginally at bedtime as needed (irritation). 03/06/20   Delsa Grana, [name]  WIXELA INHUB 250-50 MCG/DOSE AEPB Inhale 1 puff into the lungs 2 (two) times daily. 01/22/20   [provider]    Physical Exam: Vitals:   06/13/20 0821 06/13/20 0822  BP: (!) 170/119   Pulse: (!) 103   Resp: 18   Temp: 98.8 F (37.1 C)   TempSrc: Oral   SpO2: 100%   Weight:  113.9 kg  Height:  5' 5"  (1.651 m)   Wt Readings from Last 3 Encounters:  06/13/20 113.9 kg  06/04/20 116.8 kg  05/26/20 113.9 kg   Body mass index is 41.77 kg/m.  General:  Average built, not in obvious distress, morbid obese HENT: Normocephalic, pupils equally reacting to light and accommodation.  No scleral pallor or icterus noted. Oral mucosa is moist.  Chest:  Clear breath sounds.  Diminished breath sounds bilaterally. No crackles or wheezes.  CVS: S1 &S2 heard. No murmur.  Regular rate and rhythm. Abdomen: Soft, right flank and hypochondriac region tenderness, nondistended.  Bowel sounds are heard.  Liver is not palpable, no abdominal mass palpated.  Right costovertebral angle tenderness Extremities: No cyanosis, clubbing or edema.  Peripheral pulses are palpable. Psych: Alert, awake and oriented, normal mood CNS:  No cranial nerve deficits.  Power  equal in all extremities.   No cerebellar signs.   Skin: Warm and dry.  No rashes noted.  Labs on Admission:   CBC: Recent Labs  Lab 06/13/20 0819  WBC 10.2  HGB 14.7  HCT 45.1  MCV 87.9  PLT 800    Basic Metabolic Panel: Recent Labs  Lab 06/10/20 1101 06/13/20 0819  NA 140 139  K 4.3 3.9  CL 104 102  CO2 25 24  GLUCOSE 154* 150*  BUN 17 16  CREATININE 1.17 0.87  CALCIUM 9.3 10.0    Liver Function Tests: Recent Labs  Lab 06/13/20 0819  AST 22  ALT 24  ALKPHOS 77  BILITOT 0.6  PROT 8.7*  ALBUMIN 4.3   Recent Labs  Lab 06/13/20 0819  LIPASE 46   No results for input(s): AMMONIA in the last 168 hours.  Cardiac Enzymes: No results for input(s): CKTOTAL, CKMB, CKMBINDEX, TROPONINI in the last 168 hours.  BNP (last 3 results) No results for input(s): BNP in the last 8760 hours.  ProBNP (last 3 results) No results for input(s): PROBNP in the last 8760 hours.  CBG: No results for input(s): GLUCAP in the last 168 hours.  Lipase     Component Value Date/Time   LIPASE 46 06/13/2020 0819     Urinalysis    Component Value Date/Time   COLORURINE YELLOW (A) 06/13/2020 0819   APPEARANCEUR CLOUDY (A) 06/13/2020 0819   LABSPEC 1.018 06/13/2020 3491  PHURINE 5.0 06/13/2020 0819   GLUCOSEU NEGATIVE 06/13/2020 0819   GLUCOSEU NEGATIVE 09/21/2019 1038   HGBUR MODERATE (A) 06/13/2020 0819   HGBUR negative 09/30/2006 1400   [place] 06/13/2020 0819   BILIRUBINUR negative 06/04/2020 [place] 06/13/2020 0819   PROTEINUR 100 (A) 06/13/2020 0819   UROBILINOGEN 0.2 06/04/2020 1535   UROBILINOGEN 0.2 09/21/2019 1038   NITRITE POSITIVE (A) 06/13/2020 0819   LEUKOCYTESUR LARGE (A) 06/13/2020 0819      Radiological Exams on Admission: CT Renal Stone Study  Result Date: 06/13/2020 CLINICAL DATA:  Urinary tract stone. EXAM: CT ABDOMEN AND PELVIS WITHOUT CONTRAST TECHNIQUE: Multidetector CT imaging of the abdomen and pelvis was  performed following the standard protocol without IV contrast. COMPARISON:  05/26/2020 FINDINGS: Lower chest:  Mild atelectasis at the bases Hepatobiliary: No focal liver abnormality.No evidence of biliary obstruction or stone. Pancreas: Unremarkable. Spleen: Unremarkable. Adrenals/Urinary Tract: 2.7 cm low-density right adrenal mass consistent with adenoma. No hydronephrosis or ureteral stone. Multiple renal calculi, numbering at least 7 on the right and solitary on the left. Lower pole renal cortical thinning on the right is attributed to the scar. The largest stone is measured at 6 mm in the right upper pole. Unremarkable bladder. Stomach/Bowel: Dilated and fluid-filled small bowel loops with collapsed ileal loops although a specific transition point is not clearly identified. The colon is relatively decompressed. Distal colonic diverticulosis. No bowel wall thickening. Vascular/Lymphatic: No acute vascular abnormality. No mass or adenopathy. Reproductive:Hysterectomy Other: No ascites or pneumoperitoneum. Musculoskeletal: No acute abnormalities. IMPRESSION: 1. Pattern of partial small bowel obstruction although a discrete transition point is not identified. 2. No hydronephrosis or ureteral calculus. 3. Right more than left nonobstructing renal calculi. Electronically Signed   By: Monte Fantasia M.D.   On: 06/13/2020 12:05    EKG: No EKG available for review  Assessment/Plan Principal Problem:   Nausea & vomiting Active Problems:   Essential hypertension   GERD   OSA (obstructive sleep apnea)   Diabetic polyneuropathy associated with type 2 diabetes mellitus (HCC)   History of CVA (cerebrovascular accident)   COPD (chronic obstructive pulmonary disease) (HCC)   CAD (coronary artery disease)   Ileus (HCC)  Nausea ,vomiting abdominal pain and diarrhea with possible partial small bowel obstruction..  CT renal study today showed pattern of small bowel obstruction partial with no discrete transition  point.  Likely ileus from current illness.  Continue n.p.o., IV fluids IV antiemetics and supportive care.  General surgery has been consulted.  Check  x-ray in a.m.  Lipase of 46.  Urinalysis is abnormal at this time.  Will check stool for C. difficile.  We will hold Plavix for now.  Continue PPI  Recent UTI with urinary symptoms including flank pain and burning micturition.  Patient does have right costovertebral angle tenderness.  Possibility of pyelonephritis as well.  CT scan shows nephrolithiasis.  Patient had a recent treatment for UTI with Rocephin and Keflex.  Will follow urine cultures this time to rule out resistant bacteria.  Currently has been started on Zosyn from the ED, we will continue with that due to persistent symptoms.  No leukocytosis at this time.  Diabetes mellitus type 2.  We will put the patient on sliding scale insulin, Accu-Cheks, we will closely monitor.  Will hold oral hypoglycemics now. Last known hemoglobin A1c of 7.8, three days back.  Patient takes her insulin pump at home.  We will continue with basal bolus insulin.  She usually  takes 50 to 60 units/day.  Takes  Trulicity and [place] as outpatient.    Obstructive sleep apnea. Patient uses CPAP at home.  Will resume while in the hospital.  history of COPD, asthma, reactive airway disease..  Continue with the bronchodilators.  No acute flare.  Currently appears to be compensated.  Essential hypertension.   on IV hydralazine for now..  Patient takes Coreg and spironolactone and Cozaar at home.  Will resume  Hyperlipidemia.  Hold simvastatin for now.   DVT Prophylaxis: Lovenox subcu  Consultant: Surgery Dr. Dahlia Byes  Code Status: Full code  Microbiology urine cultures  Antibiotics: Zosyn IV  Family Communication:  Patients' condition and plan of care including tests being ordered have been discussed with the patient  who indicate understanding and agree with the plan.   Status is: Inpatient   Remains  inpatient appropriate because:Unsafe d/c plan, IV treatments appropriate due to intensity of illness or inability to take PO and Inpatient level of care appropriate due to severity of illness   Dispo: The patient is from: Home              Anticipated d/c is to: Home             Severity of Illness: The appropriate patient status for this patient is INPATIENT. Inpatient status is judged to be reasonable and necessary in order to provide the required intensity of service to ensure the patient's safety. The patient's presenting symptoms, physical exam findings, and initial radiographic and laboratory data in the context of their chronic comorbidities is felt to place them at high risk for further clinical deterioration. Furthermore, it is not anticipated that the patient will be medically stable for discharge from the hospital within 2 midnights of admission.   Signed, Flora Lipps, MD [facility] 06/13/2020

## 2020-06-13 NOTE — ED Triage Notes (Signed)
Pt comes into the ED via POV c/o abdominal pain, back, and emesis that started this morning at 2:00.  Pt states she has a h/o GERD and takes medication for it, but this feels different.  Pt in NAD with even and unlabored respirations at this time.  Pt admits to burning with urination as well and states she had a bladder and kidney infection over mothers day.  Pt did complete all antibiotics.

## 2020-06-14 ENCOUNTER — Ambulatory Visit: Payer: Medicare Other | Admitting: Endocrinology

## 2020-06-14 ENCOUNTER — Inpatient Hospital Stay: Payer: Medicare Other

## 2020-06-14 DIAGNOSIS — N3001 Acute cystitis with hematuria: Secondary | ICD-10-CM | POA: Diagnosis not present

## 2020-06-14 DIAGNOSIS — R531 Weakness: Secondary | ICD-10-CM

## 2020-06-14 DIAGNOSIS — E785 Hyperlipidemia, unspecified: Secondary | ICD-10-CM

## 2020-06-14 DIAGNOSIS — E1169 Type 2 diabetes mellitus with other specified complication: Secondary | ICD-10-CM | POA: Diagnosis not present

## 2020-06-14 DIAGNOSIS — Z6841 Body Mass Index (BMI) 40.0 and over, adult: Secondary | ICD-10-CM

## 2020-06-14 DIAGNOSIS — I10 Essential (primary) hypertension: Secondary | ICD-10-CM | POA: Diagnosis not present

## 2020-06-14 DIAGNOSIS — K56609 Unspecified intestinal obstruction, unspecified as to partial versus complete obstruction: Secondary | ICD-10-CM

## 2020-06-14 DIAGNOSIS — D3501 Benign neoplasm of right adrenal gland: Secondary | ICD-10-CM

## 2020-06-14 LAB — CBC
HCT: 36.6 % (ref 36.0–46.0)
Hemoglobin: 11.9 g/dL — ABNORMAL LOW (ref 12.0–15.0)
MCH: 28.7 pg (ref 26.0–34.0)
MCHC: 32.5 g/dL (ref 30.0–36.0)
MCV: 88.4 fL (ref 80.0–100.0)
Platelets: 237 10*3/uL (ref 150–400)
RBC: 4.14 MIL/uL (ref 3.87–5.11)
RDW: 14.6 % (ref 11.5–15.5)
WBC: 8.5 10*3/uL (ref 4.0–10.5)
nRBC: 0 % (ref 0.0–0.2)

## 2020-06-14 LAB — HIV ANTIBODY (ROUTINE TESTING W REFLEX): HIV Screen 4th Generation wRfx: NONREACTIVE

## 2020-06-14 LAB — BASIC METABOLIC PANEL
Anion gap: 8 (ref 5–15)
BUN: 10 mg/dL (ref 6–20)
CO2: 22 mmol/L (ref 22–32)
Calcium: 8.2 mg/dL — ABNORMAL LOW (ref 8.9–10.3)
Chloride: 108 mmol/L (ref 98–111)
Creatinine, Ser: 0.86 mg/dL (ref 0.44–1.00)
GFR, Estimated: >60 mL/min (ref >60)
Glucose, Bld: 147 mg/dL — ABNORMAL HIGH (ref 70–99)
Potassium: 3.8 mmol/L (ref 3.5–5.1)
Sodium: 138 mmol/L (ref 135–145)

## 2020-06-14 LAB — PHOSPHORUS: Phosphorus: 3 mg/dL (ref 2.5–4.6)

## 2020-06-14 LAB — GLUCOSE, CAPILLARY
Glucose-Capillary: 141 mg/dL — ABNORMAL HIGH (ref 70–99)
Glucose-Capillary: 120 mg/dL — ABNORMAL HIGH (ref 70–99)
Glucose-Capillary: 150 mg/dL — ABNORMAL HIGH (ref 70–99)
Glucose-Capillary: 154 mg/dL — ABNORMAL HIGH (ref 70–99)

## 2020-06-14 LAB — MAGNESIUM: Magnesium: 1.9 mg/dL (ref 1.7–2.4)

## 2020-06-14 MED ORDER — SODIUM CHLORIDE 0.9 % IV BOLUS
250.0000 mL | Freq: Once | INTRAVENOUS | Status: AC
  Administered 2020-06-14: 250 mL via INTRAVENOUS

## 2020-06-14 NOTE — Progress Notes (Signed)
CC: ileus Subjective: Still having back pain and some abd pain. Hungry No fevers no chills.  KUB personally reviewed showing no evidence of small bowel distention or obstruction WBC normal  Objective: Vital signs in last 24 hours: Temp:  [97.3 F (36.3 C)-98.7 F (37.1 C)] 98 F (36.7 C) (05/27 0949) Pulse Rate:  [87-104] 89 (05/27 0949) Resp:  [16-18] 18 (05/27 0949) BP: (154-191)/(89-110) 191/100 (05/27 0949) SpO2:  [98 %-100 %] 99 % (05/27 1030)    Intake/Output from previous day: 05/26 0701 - 05/27 0700 In: 87.6 [I.V.:37.6; IV Piggyback:50] Out: 60 [Urine:60] Intake/Output this shift: No intake/output data recorded.  Physical exam:  NAD alert Abd: soft, diffuse tenderness, no peritonitis Ext: well perfused and warm   Lab Results: CBC  Recent Labs    06/13/20 0819 06/14/20 0738  WBC 10.2 8.5  HGB 14.7 11.9*  HCT 45.1 36.6  PLT 282 237   BMET Recent Labs    06/13/20 0819 06/14/20 0738  NA 139 138  K 3.9 3.8  CL 102 108  CO2 24 22  GLUCOSE 150* 147*  BUN 16 10  CREATININE 0.87 0.86  CALCIUM 10.0 8.2*   PT/INR No results for input(s): LABPROT, INR in the last 72 hours. ABG No results for input(s): PHART, HCO3 in the last 72 hours.  Invalid input(s): PCO2, PO2  Studies/Results: DG ABD ACUTE 2+V W 1V CHEST  Result Date: 06/14/2020 CLINICAL DATA:  55 year old with nausea and vomiting. EXAM: DG ABDOMEN ACUTE WITH 1 VIEW CHEST COMPARISON:  Chest radiograph 05/15/2019 and abdominal radiographs 10/12/2011 FINDINGS: Prominent interstitial lung markings are similar to the previous chest radiograph. There could be a component of chronic vascular congestion. Heart size is within normal limits and stable. No clear evidence for free air on these AP sitting views. Nonobstructive bowel gas pattern with gas in small and large bowel. No large abdominal calcifications. IMPRESSION: 1. Normal bowel gas pattern. 2. Prominent interstitial and vascular lung markings.  Findings are similar to the previous examination. No acute cardiopulmonary disease. Electronically Signed   By: Markus Daft M.D.   On: 06/14/2020 08:27   CT Renal Stone Study  Result Date: 06/13/2020 CLINICAL DATA:  Urinary tract stone. EXAM: CT ABDOMEN AND PELVIS WITHOUT CONTRAST TECHNIQUE: Multidetector CT imaging of the abdomen and pelvis was performed following the standard protocol without IV contrast. COMPARISON:  05/26/2020 FINDINGS: Lower chest:  Mild atelectasis at the bases Hepatobiliary: No focal liver abnormality.No evidence of biliary obstruction or stone. Pancreas: Unremarkable. Spleen: Unremarkable. Adrenals/Urinary Tract: 2.7 cm low-density right adrenal mass consistent with adenoma. No hydronephrosis or ureteral stone. Multiple renal calculi, numbering at least 7 on the right and solitary on the left. Lower pole renal cortical thinning on the right is attributed to the scar. The largest stone is measured at 6 mm in the right upper pole. Unremarkable bladder. Stomach/Bowel: Dilated and fluid-filled small bowel loops with collapsed ileal loops although a specific transition point is not clearly identified. The colon is relatively decompressed. Distal colonic diverticulosis. No bowel wall thickening. Vascular/Lymphatic: No acute vascular abnormality. No mass or adenopathy. Reproductive:Hysterectomy Other: No ascites or pneumoperitoneum. Musculoskeletal: No acute abnormalities. IMPRESSION: 1. Pattern of partial small bowel obstruction although a discrete transition point is not identified. 2. No hydronephrosis or ureteral calculus. 3. Right more than left nonobstructing renal calculi. Electronically Signed   By: Monte Fantasia M.D.   On: 06/13/2020 12:05    Anti-infectives: Anti-infectives (From admission, onward)   Start  Dose/Rate Route Frequency Ordered Stop   06/13/20 2100  piperacillin-tazobactam (ZOSYN) IVPB 3.375 g        3.375 g 12.5 mL/hr over 240 Minutes Intravenous Every 8  hours 06/13/20 1436     06/13/20 1330  piperacillin-tazobactam (ZOSYN) IVPB 3.375 g        3.375 g 100 mL/hr over 30 Minutes Intravenous  Once 06/13/20 1315 06/13/20 1414      Assessment/Plan:  Abdominal pain nausea and vomiting likely related to enteritis vs pyelonephritis.  No evidence of bowel obstruction.  Patient is hungry and wishes to have a full liquid diet.  Abdominal still with some diffuse tenderness but without any peritonitis. Will advance diet slowly.  No surgical intervention. AWaiting C. Difficile No Surgical issues at this time.  We will be available Note  That I spent at least 35 minutes in this encounter with greater than 50% spent in coordination counseling of her care  Caroleen Hamman, MD, [facility]  06/14/2020

## 2020-06-14 NOTE — Evaluation (Signed)
Physical Therapy Evaluation Patient Details Name: [name] MRN: [id number] DOB: 1965-04-11 Today's Date: 06/14/2020   History of Present Illness  [name] is 7yoF comes [facility] c N?V, ABD pain. CT renal study showing partial SBO likely ileus per MD note. PMH: COPD, DM, HTN, GAD, recent UTI, OSA on CPAP.  Clinical Impression  Pt admitted with above diagnosis. Pt currently with functional limitations due to the deficits listed below (see "PT Problem List"). Upon entry, pt in bed, drowsy, agreeable to participate. The pt pleasant, interactive, and able to provide info regarding prior level of function, both in tolerance and independence. Pt reports since her antihypertensives a few hours ago and subsequent BP drop, she has felt globally weak, had increased RUE weakness, slightly more slurred speech. She denies any paresthesias. She reports this happens when she experienced hypotension. Pt able to move to EOB at supervision level and transfer/AMB short distances with minGuard Assist and QC. Pt reports large subjective increased in Lt hemiweakness and in balance impairment. BP higher once sitting at EOBPatient's performance this date reveals decreased ability, independence, and tolerance in performing all basic mobility required for performance of activities of daily living. Pt requires additional DME, close physical assistance, and cues for safe participation in mobility. Pt will benefit from skilled PT intervention to increase independence and safety with basic mobility in preparation for discharge to the venue listed below.       Follow Up Recommendations Home health PT;Supervision - Intermittent;Supervision for mobility/OOB    Equipment Recommendations  Rolling walker with 5" wheels    Recommendations for Other Services       Precautions / Restrictions Precautions Precautions: Fall Restrictions Weight Bearing Restrictions: No      Mobility  Bed Mobility Overal bed  mobility: Needs Assistance Bed Mobility: Supine to Sit     Supine to sit: Supervision;HOB elevated          Transfers Overall transfer level: Needs assistance Equipment used: Quad cane Transfers: Sit to/from Stand Sit to Stand: Min guard;Supervision         General transfer comment: actually does a pretty good job despite feeling so poorly  Ambulation/Gait Ambulation/Gait assistance: Counsellor (Feet): 25 Feet Assistive device: Quad cane Gait Pattern/deviations: Step-to pattern     General Gait Details: leads with left leg, chronic almost-foot-drop motor patterns and joint changes, poor foot clearance, forefoot strike, lcoked knee with recurvatum in loading. Balance worse than baseline "quite a bit"  Stairs            Wheelchair Mobility    Modified Rankin (Stroke Patients Only)       Balance Overall balance assessment: Needs assistance;History of Falls                                           Pertinent Vitals/Pain Pain Assessment: Faces Faces Pain Scale: Hurts even more Pain Location: bilat flank, low back, RLQ Pain Intervention(s): Monitored during session    Home Living Family/patient expects to be discharged to:: Private residence Living Arrangements: Children;Other relatives Daphene Calamity (DTR); 4 grandchildren) Available Help at Discharge: Family (DTR always works from home) Type of Home: House Home Access: Stairs to enter Entrance Stairs-Rails:  (banisters, but no rails) Technical brewer of Steps: 3 Home Layout: One level Home Equipment: Cane - quad;Bedside commode;Shower seat;Grab bars - tub/shower  Prior Function Level of Independence: Needs assistance   Gait / Transfers Assistance Needed: SBQC household distances; falling typically associated dizziness, legs buckling, has been syncopal twice (Pt reports >5year history of syncope)  ADL's / Homemaking Assistance Needed: needs help with  dressing and hair, can toilet self, feed self;        Hand Dominance        Extremity/Trunk Assessment   Upper Extremity Assessment Upper Extremity Assessment: Generalized weakness;Overall [facility] for tasks assessed (chronic Left hemiweakness worse today since BP dropped)    Lower Extremity Assessment Lower Extremity Assessment: Generalized weakness;Overall [facility] for tasks assessed (chronic Left hemiweakness worse today since BP dropped)       Communication      Cognition Arousal/Alertness: Awake/alert (drowsy) Behavior During Therapy: WFL for tasks assessed/performed Overall Cognitive Status: Within Functional Limits for tasks assessed                                        General Comments      Exercises General Exercises - Lower Extremity Long Arc Quad: AROM;Both;5 reps;Seated   Assessment/Plan    PT Assessment Patient needs continued PT services  PT Problem List Decreased strength;Decreased activity tolerance;Decreased balance;Decreased mobility;Decreased knowledge of precautions       PT Treatment Interventions DME instruction;Balance training;Gait training;Stair training;Functional mobility training;Therapeutic activities;Therapeutic exercise;Patient/family education    PT Goals (Current goals can be found in the Care Plan section)  Acute Rehab PT Goals Patient Stated Goal: regain strength, stop falling at home so much PT Goal Formulation: With patient Time For Goal Achievement: 06/28/20 Potential to Achieve Goals: Good    Frequency Min 2X/week   Barriers to discharge Inaccessible home environment stairs to enter, will need to attempt prior to DC    Co-evaluation               AM-PAC PT "6 Clicks" Mobility  Outcome Measure Help needed turning from your back to your side while in a flat bed without using bedrails?: A Little Help needed moving from lying on your back to sitting on the side of a flat bed without using bedrails?: A  Little Help needed moving to and from a bed to a chair (including a wheelchair)?: A Little Help needed standing up from a chair using your arms (e.g., wheelchair or bedside chair)?: A Lot Help needed to walk in hospital room?: A Lot Help needed climbing 3-5 steps with a railing? : A Lot 6 Click Score: 15    End of Session   Activity Tolerance: Patient tolerated treatment well;Patient limited by fatigue Patient left: in bed;with call bell/phone within reach Nurse Communication: Mobility status (IV infusion complete) PT Visit Diagnosis: Unsteadiness on feet (R26.81);Difficulty in walking, not elsewhere classified (R26.2);Other abnormalities of gait and mobility (R26.89);Other symptoms and signs involving the nervous system (R29.898)    Time: 1350-1425 PT Time Calculation (min) (ACUTE ONLY): 35 min   Charges:   PT Evaluation $PT Eval Low Complexity: 1 Low PT Treatments $Neuromuscular Re-education: 8-22 mins        2:39 PM, 06/14/20 Etta Grandchild, PT, DPT Physical Therapist - [facility]  [phone] ([place])    [place] C 06/14/2020, 2:36 PM

## 2020-06-14 NOTE — Evaluation (Signed)
Occupational Therapy Evaluation Patient Details Name: [name] MRN: [id number] DOB: [birth year] Today's Date: 06/14/2020    History of Present Illness [name] is 41yoF comes [facility] c N?V, ABD pain. CT renal study showing partial SBO likely ileus per MD note. PMH: COPD, DM, HTN, GAD, recent UTI, OSA on CPAP.   Clinical Impression   Pt seen for OT evaluation this date in setting of acute hospitalization d/t SBO. Pt reports being MOD I at baseline for fxl mobility and requiring some family assistance for LB ADLs including dressing. Pt's dtr and god dtrs also assist with IADLs such as running errands. Pt reports that she has had at least 4 falls in last 6 months. Pt presents this date with decreased fxl activity tolerance and abd discomfort superimposed on baseline L side weakness 2/2/ stroke hx, impacting her ability to safely and efficiently perform ADLs/ADL mobility at her baseline. Pt currently requires CGA for ADL transfers, SETUP to MIN A for seated UB ADLs and MOD A for seated LB ADLs. Pt left in chair with chair alarm. All needs met and in reach. RN presenting to administer medications. Will continue to follow acutely and recommend that pt f/u with HHOT services to increase tolerance and strength as it applies to the safe completion of self care.    Follow Up Recommendations  Home health OT    Equipment Recommendations  3 in 1 bedside commode;Tub/shower seat    Recommendations for Other Services       Precautions / Restrictions Precautions Precautions: Fall Restrictions Weight Bearing Restrictions: No      Mobility Bed Mobility Overal bed mobility: Needs Assistance Bed Mobility: Supine to Sit     Supine to sit: Supervision;HOB elevated          Transfers Overall transfer level: Needs assistance Equipment used: Quad cane Transfers: Sit to/from [facility] Sit to Stand: Min guard;Supervision Stand pivot transfers: Min  guard;Supervision       General transfer comment: increased time, effortful, but good control.    Balance Overall balance assessment: Needs assistance;History of Falls   Sitting balance-Leahy Scale: Good Sitting balance - Comments: G static     Standing balance-Leahy Scale: Fair Standing balance comment: requires unilateral UE support (R side)                           ADL either performed or assessed with clinical judgement   ADL Overall ADL's : Needs assistance/impaired                                       General ADL Comments: SETUP to MIN A for seated UB ADLs, MIN A to MOD A for seated LB ADLs (primarily only needs assist for L LE)     Vision Patient Visual Report: No change from baseline       Perception     Praxis      Pertinent Vitals/Pain Pain Assessment: Faces Faces Pain Scale: Hurts little more Pain Location: bilat flank, low back, RLQ Pain Descriptors / Indicators: Grimacing;Discomfort Pain Intervention(s): Monitored during session     Hand Dominance Right   Extremity/Trunk Assessment Upper Extremity Assessment Upper Extremity Assessment: RUE deficits/detail;LUE deficits/detail RUE Deficits / Details: ROM WFL, grip MMT grossly 4/5 LUE Deficits / Details: shld ROM to 2/3 range, grip MMT grossly 4-/5   Lower Extremity  Assessment Lower Extremity Assessment: RLE deficits/detail;LLE deficits/detail LLE Deficits / Details: L side weaker than R and more limited with ROM as it pertains to ADLs such as hip rotation       Communication Communication Communication: No difficulties   Cognition Arousal/Alertness: Awake/alert Behavior During Therapy: WFL for tasks assessed/performed Overall Cognitive Status: Within Functional Limits for tasks assessed                                     General Comments       Exercises General Exercises - Lower Extremity Long Arc Quad: AROM;Both;5 reps;Seated Other  Exercises Other Exercises: OT engages pt in seated LB ADLs with modified technique and pt somewhat familiar from previous bouts of therapy.   Shoulder Instructions      Home Living Family/patient expects to be discharged to:: Private residence Living Arrangements: Children;Other relatives Russ Halo (DTR); 4 grandchildren) Available Help at Discharge: Family (DTR always works from home) Type of Home: House Home Access: Stairs to enter Technical brewer of Steps: [place]: One level     Bathroom Shower/Tub: Occupational psychologist: [place]: [place] - quad;Bedside commode;Shower seat;Grab bars - tub/shower          Prior Functioning/Environment Level of Independence: Needs assistance  Gait / Transfers Assistance Needed: SBQC household distances; falling typically associated dizziness, legs buckling, has been syncopal twice (Pt reports >5year history of syncope) ADL's / Homemaking Assistance Needed: Reports neeing occasional assistance to get dress and bathe from dtr or a god dtr. Has assistance from her dtr or god dtrs for IADLs and errands.   Comments: Has medical transport for appts.        OT Problem List: Decreased strength;Decreased range of motion;Decreased activity tolerance;Impaired balance (sitting and/or standing);Decreased coordination;Impaired UE functional use;Obesity      OT Treatment/Interventions: Self-care/ADL training;DME and/or AE instruction;Therapeutic activities;Therapeutic exercise;Balance training;Neuromuscular education;Patient/family education    OT Goals(Current goals can be found in the care plan section) Acute Rehab OT Goals Patient Stated Goal: regain strength, stop falling at home so much OT Goal Formulation: With patient Time For Goal Achievement: 06/28/20 Potential to Achieve Goals: Good ADL Goals Pt Will Perform Lower Body Dressing: with supervision;sit to/from stand Pt Will Transfer to  Toilet: with supervision;ambulating (with SPC to/from restroom) Pt Will Perform Toileting - Clothing Manipulation and hygiene: with supervision;sit to/from stand  OT Frequency: Min 1X/week   Barriers to D/C:            Co-evaluation              AM-PAC OT "6 Clicks" Daily Activity     Outcome Measure Help from another person eating meals?: None Help from another person taking care of personal grooming?: A Little Help from another person toileting, which includes using toliet, bedpan, or urinal?: A Little Help from another person bathing (including washing, rinsing, drying)?: A Little Help from another person to put on and taking off regular upper body clothing?: A Little Help from another person to put on and taking off regular lower body clothing?: A Little 6 Click Score: 19   End of Session Equipment Utilized During Treatment: Other (comment) (qc) Nurse Communication: Mobility status  Activity Tolerance: Patient tolerated treatment well Patient left: in chair;with call bell/phone within reach;with chair alarm set (OT plugged chair alarm in to nurse station and posey  box, but batteries dead, RN notified.)  OT Visit Diagnosis: Unsteadiness on feet (R26.81);Muscle weakness (generalized) (M62.81)                Time: 1643-5391 OT Time Calculation (min): 23 min Charges:  OT General Charges $OT Visit: 1 Visit OT Evaluation $OT Eval Moderate Complexity: 1 Mod OT Treatments $Self Care/Home Management : 8-22 mins  Gerrianne Scale, MS, OTR/L ascom [phone] 06/14/20, 6:33 PM

## 2020-06-14 NOTE — Progress Notes (Signed)
Patient ID: [name], female   DOB: 11/12/1965, 55 y.o.   MRN: [id number] [facility] PROGRESS NOTE  [name] WIO:[id number] DOB: [birth year] DOA: 06/13/2020 PCP: Karen Grana, [name]  HPI/Subjective: Patient has some abdominal pain.  No nausea or vomiting.  No bowel movement in a few days.  Had diarrhea prior to coming in.  Patient feels weak and wobbly.  After nurse gave IV hydralazine blood pressure dropped down and felt dizzy.  Objective: Vitals:   06/14/20 1115 06/14/20 1251  BP: (!) 161/103 97/61  Pulse: 83 74  Resp:    Temp:    SpO2:  93%    Intake/Output Summary (Last 24 hours) at 06/14/2020 1306 Last data filed at 06/14/2020 0444 Gross per 24 hour  Intake 87.61 ml  Output 60 ml  Net 27.61 ml   Filed Weights   06/13/20 0822  Weight: 113.9 kg    ROS: Review of Systems  Respiratory: Negative for shortness of breath.   Cardiovascular: Negative for chest pain.  Gastrointestinal: Positive for abdominal pain. Negative for nausea and vomiting.  Musculoskeletal: Positive for joint pain.   Exam: Physical Exam HENT:     Head: Normocephalic.     Mouth/Throat:     Pharynx: No oropharyngeal exudate.  Eyes:     General: Lids are normal.     Conjunctiva/sclera: Conjunctivae normal.  Cardiovascular:     Rate and Rhythm: Normal rate and regular rhythm.     Heart sounds: Normal heart sounds, S1 normal and S2 normal.  Pulmonary:     Breath sounds: No decreased breath sounds, wheezing, rhonchi or rales.  Abdominal:     Palpations: Abdomen is soft.     Tenderness: There is generalized abdominal tenderness.  Musculoskeletal:     Right ankle: No swelling.     Left ankle: No swelling.  Skin:    General: Skin is warm.     Findings: No rash.     Comments: Band-Aid over right leg where she fell.  Neurological:     Mental Status: She is alert and oriented to person, place, and time.       Data Reviewed: Basic Metabolic Panel: Recent Labs  Lab  06/10/20 1101 06/13/20 0819 06/14/20 0738  NA 140 139 138  K 4.3 3.9 3.8  CL 104 102 108  CO2 25 24 22   GLUCOSE 154* 150* 147*  BUN 17 16 10   CREATININE 1.17 0.87 0.86  CALCIUM 9.3 10.0 8.2*  MG  --   --  1.9  PHOS  --   --  3.0   Liver Function Tests: Recent Labs  Lab 06/13/20 0819  AST 22  ALT 24  ALKPHOS 77  BILITOT 0.6  PROT 8.7*  ALBUMIN 4.3   Recent Labs  Lab 06/13/20 0819  LIPASE 46   CBC: Recent Labs  Lab 06/13/20 0819 06/14/20 0738  WBC 10.2 8.5  HGB 14.7 11.9*  HCT 45.1 36.6  MCV 87.9 88.4  PLT 282 237    CBG: Recent Labs  Lab 06/13/20 1631 06/13/20 2127 06/14/20 0748 06/14/20 1130  GLUCAP 93 138* 141* 120*    Recent Results (from the past 240 hour(s))  Urine Culture     Status: Abnormal (Preliminary result)   Collection Time: 06/13/20  8:19 AM   Specimen: Urine, Clean Catch  Result Value Ref Range Status   Specimen Description   Final    URINE, CLEAN CATCH Performed at [facility], [address] [place], [place]  [address]    Special Requests   Final    NONE Performed at [facility], [address] [place], [place] [address]    Culture >=100,000 COLONIES/mL GRAM NEGATIVE RODS (A)  Final   Report Status PENDING  Incomplete  Resp Panel by RT-PCR (Flu A&B, Covid) Nasopharyngeal Swab     Status: None   Collection Time: 06/13/20  1:08 PM   Specimen: Nasopharyngeal Swab; Nasopharyngeal(NP) swabs in vial transport medium  Result Value Ref Range Status   SARS Coronavirus 2 by RT PCR NEGATIVE NEGATIVE Final    Comment: (NOTE) SARS-CoV-2 target nucleic acids are NOT DETECTED.  The SARS-CoV-2 RNA is generally detectable in upper respiratory specimens during the acute phase of infection. The lowest concentration of SARS-CoV-2 viral copies this assay can detect is 138 copies/mL. A negative result does not preclude SARS-Cov-2 infection and should not be used as the sole basis for treatment or other patient  management decisions. A negative result may occur with  improper specimen collection/handling, submission of specimen other than nasopharyngeal swab, presence of viral mutation(s) within the areas targeted by this assay, and inadequate number of viral copies(<138 copies/mL). A negative result must be combined with clinical observations, patient history, and epidemiological information. The expected result is Negative.  Fact Sheet for Patients:  EntrepreneurPulse.com.au  Fact Sheet for Healthcare Providers:  IncredibleEmployment.be  This test is no t yet approved or cleared by the [place] FDA and  has been authorized for detection and/or diagnosis of SARS-CoV-2 by FDA under an Emergency Use Authorization (EUA). This EUA will remain  in effect (meaning this test can be used) for the duration of the COVID-19 declaration under Section 564(b)(1) of the Act, 21 U.S.C.section 360bbb-3(b)(1), unless the authorization is terminated  or revoked sooner.       Influenza A by PCR NEGATIVE NEGATIVE Final   Influenza B by PCR NEGATIVE NEGATIVE Final    Comment: (NOTE) The Xpert Xpress SARS-CoV-2/FLU/RSV plus assay is intended as an aid in the diagnosis of influenza from Nasopharyngeal swab specimens and should not be used as a sole basis for treatment. Nasal washings and aspirates are unacceptable for Xpert Xpress SARS-CoV-2/FLU/RSV testing.  Fact Sheet for Patients: EntrepreneurPulse.com.au  Fact Sheet for Healthcare Providers: IncredibleEmployment.be  This test is not yet approved or cleared by the [place] FDA and has been authorized for detection and/or diagnosis of SARS-CoV-2 by FDA under an Emergency Use Authorization (EUA). This EUA will remain in effect (meaning this test can be used) for the duration of the COVID-19 declaration under Section 564(b)(1) of the Act, 21 U.S.C. section 360bbb-3(b)(1),  unless the authorization is terminated or revoked.  Performed at [facility], [address] [place], [place] [address]      Studies: DG ABD ACUTE 2+V W 1V CHEST  Result Date: 06/14/2020 CLINICAL DATA:  55 year old with nausea and vomiting. EXAM: DG ABDOMEN ACUTE WITH 1 VIEW CHEST COMPARISON:  Chest radiograph 05/15/2019 and abdominal radiographs 10/12/2011 FINDINGS: Prominent interstitial lung markings are similar to the previous chest radiograph. There could be a component of chronic vascular congestion. Heart size is within normal limits and stable. No clear evidence for free air on these AP sitting views. Nonobstructive bowel gas pattern with gas in small and large bowel. No large abdominal calcifications. IMPRESSION: 1. Normal bowel gas pattern. 2. Prominent interstitial and vascular lung markings. Findings are similar to the previous examination. No acute cardiopulmonary disease. Electronically Signed   By: Scherrie Gerlach.D.  On: 06/14/2020 08:27   CT Renal Stone Study  Result Date: 06/13/2020 CLINICAL DATA:  Urinary tract stone. EXAM: CT ABDOMEN AND PELVIS WITHOUT CONTRAST TECHNIQUE: Multidetector CT imaging of the abdomen and pelvis was performed following the standard protocol without IV contrast. COMPARISON:  05/26/2020 FINDINGS: Lower chest:  Mild atelectasis at the bases Hepatobiliary: No focal liver abnormality.No evidence of biliary obstruction or stone. Pancreas: Unremarkable. Spleen: Unremarkable. Adrenals/Urinary Tract: 2.7 cm low-density right adrenal mass consistent with adenoma. No hydronephrosis or ureteral stone. Multiple renal calculi, numbering at least 7 on the right and solitary on the left. Lower pole renal cortical thinning on the right is attributed to the scar. The largest stone is measured at 6 mm in the right upper pole. Unremarkable bladder. Stomach/Bowel: Dilated and fluid-filled small bowel loops with collapsed ileal loops although a specific transition  point is not clearly identified. The colon is relatively decompressed. Distal colonic diverticulosis. No bowel wall thickening. Vascular/Lymphatic: No acute vascular abnormality. No mass or adenopathy. Reproductive:Hysterectomy Other: No ascites or pneumoperitoneum. Musculoskeletal: No acute abnormalities. IMPRESSION: 1. Pattern of partial small bowel obstruction although a discrete transition point is not identified. 2. No hydronephrosis or ureteral calculus. 3. Right more than left nonobstructing renal calculi. Electronically Signed   By: Monte Fantasia M.D.   On: 06/13/2020 12:05    Scheduled Meds: . baclofen  10 mg Oral TID  . budesonide  0.5 mg Inhalation Daily  . carvedilol  12.5 mg Oral BID WC  . DULoxetine  20 mg Oral QHS  . DULoxetine  40 mg Oral q morning  . enoxaparin (LOVENOX) injection  0.5 mg/kg Subcutaneous Q24H  . gabapentin  1,200 mg Oral QHS  . gabapentin  600 mg Oral BID  . insulin aspart  0-15 Units Subcutaneous TID WC  . insulin aspart  0-5 Units Subcutaneous QHS  . insulin glargine  30 Units Subcutaneous QHS  . levETIRAcetam  500 mg Oral q morning  . levETIRAcetam  750 mg Oral QHS  . losartan  100 mg Oral Daily  . mometasone-formoterol  2 puff Inhalation BID  . pantoprazole (PROTONIX) IV  40 mg Intravenous Q12H  . spironolactone  25 mg Oral Daily   Continuous Infusions: . sodium chloride 125 mL/hr at 06/13/20 1638  . piperacillin-tazobactam (ZOSYN)  IV 3.375 g (06/14/20 0726)    Assessment/Plan:  1. Partial small bowel obstruction.  General surgery advance diet to full liquid diet.  No nausea or vomiting.  Continue supportive care. 2. Acute cystitis With hematuria.  Urine culture growing E. coli.  Sensitivities pending, empirically on Zosyn for now. 3. Essential hypertension.  Continue oral meds.  Blood pressure was very high and they gave IV hydralazine and blood pressure then went low.  Discontinue IV hydralazine.  Oral medications for now. 4. Type 2 diabetes  mellitus with hyperlipidemia.  Holding simvastatin for right now.  On sliding scale.  On glargine insulin.  Uses her insulin pump at home. 5. Obesity with a BMI of 41.77 and obstructive sleep apnea on CPAP. 6. History of COPD continue inhalers and nebulizers 7. Weakness and falls.  Physical therapy evaluation 8. Nonobstructing kidney stones we will have her follow-up with urology as outpatient. 9. 2.7 cm adrenal adenoma.  Will need follow-up as outpatient.        Code Status:     Code Status Orders  (From admission, onward)         Start     Ordered   06/13/20 1412  Full code  Continuous        06/13/20 1414        Code Status History    Date Active Date Inactive Code Status Order ID Comments User Context   05/16/2019 1907 05/21/2019 1801 Full Code [id number]  Athena Masse, MD ED   04/27/2019 1408 04/29/2019 2021 Full Code [id number]  Ivor Costa, MD ED   02/22/2014 2044 03/01/2014 2044 Full Code [id number]  Berle Mull, MD Inpatient   09/24/2011 1356 09/25/2011 2010 Full Code [id number]  Herrschaft, Gaye Alken, RN Inpatient   08/22/2011 4193 08/26/2011 2128 Full Code [id number]  Donzetta Kohut, RN Inpatient   05/20/2011 1920 05/21/2011 1045 Full Code [id number]  Chauncy Passy, MD ED   Advance Care Planning Activity    Advance Directive Documentation   Flowsheet Row Most Recent Value  Type of Advance Directive Healthcare Power of Attorney  Pre-existing out of facility DNR order (yellow form or pink MOST form) --  "MOST" Form in Place? --     Family Communication: Spoke with the patient's daughter on the phone Disposition Plan: Status is: Inpatient  Dispo: The patient is from: Home              Anticipated d/c is to: Home              Patient currently being treated for partial small bowel obstruction and acute cystitis with hematuria with IV antibiotics   Difficult to place patient.  No.   Consultants:  General surgery  Antibiotics:  Zosyn  Time spent: 28 minutes  [place]

## 2020-06-15 ENCOUNTER — Inpatient Hospital Stay: Payer: Medicare Other

## 2020-06-15 DIAGNOSIS — E1169 Type 2 diabetes mellitus with other specified complication: Secondary | ICD-10-CM | POA: Diagnosis not present

## 2020-06-15 DIAGNOSIS — R279 Unspecified lack of coordination: Secondary | ICD-10-CM

## 2020-06-15 DIAGNOSIS — I10 Essential (primary) hypertension: Secondary | ICD-10-CM | POA: Diagnosis not present

## 2020-06-15 DIAGNOSIS — N3001 Acute cystitis with hematuria: Secondary | ICD-10-CM | POA: Diagnosis not present

## 2020-06-15 DIAGNOSIS — K56609 Unspecified intestinal obstruction, unspecified as to partial versus complete obstruction: Secondary | ICD-10-CM | POA: Diagnosis not present

## 2020-06-15 LAB — GLUCOSE, CAPILLARY
Glucose-Capillary: 146 mg/dL — ABNORMAL HIGH (ref 70–99)
Glucose-Capillary: 146 mg/dL — ABNORMAL HIGH (ref 70–99)
Glucose-Capillary: 135 mg/dL — ABNORMAL HIGH (ref 70–99)

## 2020-06-15 LAB — BASIC METABOLIC PANEL
Anion gap: 8 (ref 5–15)
BUN: 13 mg/dL (ref 6–20)
CO2: 22 mmol/L (ref 22–32)
Calcium: 8.6 mg/dL — ABNORMAL LOW (ref 8.9–10.3)
Chloride: 108 mmol/L (ref 98–111)
Creatinine, Ser: 0.88 mg/dL (ref 0.44–1.00)
GFR, Estimated: >60 mL/min (ref >60)
Glucose, Bld: 171 mg/dL — ABNORMAL HIGH (ref 70–99)
Potassium: 3.8 mmol/L (ref 3.5–5.1)
Sodium: 138 mmol/L (ref 135–145)

## 2020-06-15 LAB — CBC
HCT: 38.8 % (ref 36.0–46.0)
Hemoglobin: 12.4 g/dL (ref 12.0–15.0)
MCH: 28.6 pg (ref 26.0–34.0)
MCHC: 32 g/dL (ref 30.0–36.0)
MCV: 89.6 fL (ref 80.0–100.0)
Platelets: 235 10*3/uL (ref 150–400)
RBC: 4.33 MIL/uL (ref 3.87–5.11)
RDW: 14.9 % (ref 11.5–15.5)
WBC: 6.5 10*3/uL (ref 4.0–10.5)
nRBC: 0 % (ref 0.0–0.2)

## 2020-06-15 LAB — URINE CULTURE: Culture: >=100000 — AB

## 2020-06-15 MED ORDER — CLOPIDOGREL BISULFATE 75 MG PO TABS
75.0000 mg | ORAL_TABLET | Freq: Every day | ORAL | Status: DC
  Administered 2020-06-15 – 2020-06-20 (×6): 75 mg via ORAL
  Filled 2020-06-15 (×6): qty 1

## 2020-06-15 MED ORDER — CEFTRIAXONE SODIUM 1 G IJ SOLR
1.0000 g | INTRAMUSCULAR | Status: AC
  Administered 2020-06-15 – 2020-06-17 (×3): 1 g via INTRAVENOUS
  Filled 2020-06-15 (×3): qty 10

## 2020-06-15 MED ORDER — IPRATROPIUM-ALBUTEROL 0.5-2.5 (3) MG/3ML IN SOLN
3.0000 mL | Freq: Every day | RESPIRATORY_TRACT | Status: DC
  Administered 2020-06-15 – 2020-06-20 (×6): 3 mL via RESPIRATORY_TRACT
  Filled 2020-06-15 (×6): qty 3

## 2020-06-15 MED ORDER — DEXTROSE 50 % IV SOLN
INTRAVENOUS | Status: AC
  Filled 2020-06-15: qty 50

## 2020-06-15 MED ORDER — SIMETHICONE 80 MG PO CHEW
80.0000 mg | CHEWABLE_TABLET | Freq: Four times a day (QID) | ORAL | Status: DC
  Administered 2020-06-15 – 2020-06-20 (×21): 80 mg via ORAL
  Filled 2020-06-15 (×21): qty 1

## 2020-06-15 MED ORDER — CLOPIDOGREL BISULFATE 75 MG PO TABS: 75.0000 mg | ORAL_TABLET | Freq: Every day | ORAL | Status: DC

## 2020-06-15 NOTE — Progress Notes (Addendum)
Patient ID: [name], female   DOB: 01-Jul-1965, 55 y.o.   MRN: [id number]  Called to bedside by nursing staff with increasing right arm weakness.  Patient noticed she was unable to coordinate her spoon with eating lunch today.  Patient also feels a little weak in the right leg.  Patient 4 out of 5 power right arm and right leg.  5 out of 5 left arm and 4 out of 5 left leg.  Patient was admitted with small bowel obstruction we are holding Plavix just in case surgery needed.  Case discussed with neurologist Dr. Quinn Axe.  And activating code stroke.  Stat CT scan of the head ordered. As per neuro, can restart Plavix  Update daughter about stroke work up  Dr Loletha Grayer

## 2020-06-15 NOTE — Progress Notes (Signed)
Chaplain Maggie responded to Code Stroke and reported to patient's room. No family present. Code Stroke was cancelled. Chaplain available for continued support as needed per on call Chaplain.

## 2020-06-15 NOTE — Progress Notes (Signed)
Subjective:  CC: [name] is a 55 y.o. female  Hospital stay day 2,   abdominal pain  HPI: Report of increased pain this am.  She states she has not been able to have a BM since 2-3 days ago.  Pain was initially in RLQ, but not intermittently moves all around.  Also complaining of increasing back pain as well.  Some nausea but overall tolerating full liquid diet  ROS:  General: Denies weight loss, weight gain, fatigue, fevers, chills, and night sweats. Heart: Denies chest pain, palpitations, racing heart, irregular heartbeat, leg pain or swelling, and decreased activity tolerance. Respiratory: Denies breathing difficulty, shortness of breath, wheezing, cough, and sputum. GI: Denies change in appetite, heartburn, nausea, vomiting, constipation, diarrhea, and blood in stool. GU: Denies difficulty urinating, pain with urinating, urgency, frequency, blood in urine.   Objective:   Temp:  [98 F (36.7 C)-98.4 F (36.9 C)] 98 F (36.7 C) (05/28 0727) Pulse Rate:  [74-84] 84 (05/28 0751) Resp:  [16-20] 16 (05/28 0751) BP: (97-161)/(61-103) 149/88 (05/28 0727) SpO2:  [93 %-100 %] 98 % (05/28 0751) Weight:  [115.9 kg] 115.9 kg (05/28 0551)     Height: 5' 5"  (165.1 cm) Weight: 115.9 kg BMI (Calculated): 42.52   Intake/Output this shift:   Intake/Output Summary (Last 24 hours) at 06/15/2020 1042 Last data filed at 06/15/2020 0030 Gross per 24 hour  Intake 1078.55 ml  Output 1300 ml  Net -221.45 ml    Constitutional :  alert, cooperative, appears stated age and no distress  Respiratory:  clear to auscultation bilaterally  Cardiovascular:  regular rate and rhythm  Gastrointestinal: soft, no guarding. TTP in all four quadrants, but less so when distracted with questions.  no rebound, no obvious distention noted.   Skin: Cool and moist.   Psychiatric: Normal affect, non-agitated, not confused       LABS:  CMP Latest Ref Rng & Units 06/15/2020 06/14/2020 06/13/2020  Glucose 70  - 99 mg/dL 171(H) 147(H) 150(H)  BUN 6 - 20 mg/dL 13 10 16   Creatinine 0.44 - 1.00 mg/dL 0.88 0.86 0.87  Sodium 135 - 145 mmol/L 138 138 139  Potassium 3.5 - 5.1 mmol/L 3.8 3.8 3.9  Chloride 98 - 111 mmol/L 108 108 102  CO2 22 - 32 mmol/L 22 22 24   Calcium 8.9 - 10.3 mg/dL 8.6(L) 8.2(L) 10.0  Total Protein 6.5 - 8.1 g/dL - - 8.7(H)  Total Bilirubin 0.3 - 1.2 mg/dL - - 0.6  Alkaline Phos 38 - 126 U/L - - 77  AST 15 - 41 U/L - - 22  ALT 0 - 44 U/L - - 24   CBC Latest Ref Rng & Units 06/15/2020 06/14/2020 06/13/2020  WBC 4.0 - 10.5 K/uL 6.5 8.5 10.2  Hemoglobin 12.0 - 15.0 g/dL 12.4 11.9(L) 14.7  Hematocrit 36.0 - 46.0 % 38.8 36.6 45.1  Platelets 150 - 400 K/uL 235 237 282    RADS: n/a Assessment:   Abdominal pain.  Still no obvious etiology but doubt any acute surgical issue since she is still tolerating full liquid diet and pain is inconsistent.  Recommend staying on full liquid for another day and continue to monitor.  Gas-x as needed for possible colic.

## 2020-06-15 NOTE — Progress Notes (Signed)
Patient complaints of increased weakness to right hand.  Neuro assessment done. MD notified. No acute distress noted.

## 2020-06-15 NOTE — Progress Notes (Signed)
Patient ID: [name], female   DOB: 24-Oct-1965, 55 y.o.   MRN: [id number] [facility] PROGRESS NOTE  [name] NLZ:[id number] DOB: May 24, 1965 DOA: 06/13/2020 PCP: Delsa Grana, [name]  HPI/Subjective: Called to see the patient this morning secondary to 8 out of 10 abdominal pain.  Patient states that she has not passed gas or had a bowel movement as of this morning.  Tolerating liquid diet.  Admitted with small bowel obstruction  Objective: Vitals:   06/15/20 0751 06/15/20 1143  BP:  (!) 148/80  Pulse: 84 72  Resp: 16 18  Temp:  97.9 F (36.6 C)  SpO2: 98% 97%    Intake/Output Summary (Last 24 hours) at 06/15/2020 1237 Last data filed at 06/15/2020 0030 Gross per 24 hour  Intake 1078.55 ml  Output 1300 ml  Net -221.45 ml   Filed Weights   06/13/20 0822 06/15/20 0551  Weight: 113.9 kg 115.9 kg    ROS: Review of Systems  Respiratory: Negative for shortness of breath.   Gastrointestinal: Positive for abdominal pain and constipation. Negative for nausea and vomiting.   Exam: Physical Exam HENT:     Head: Normocephalic.     Mouth/Throat:     Pharynx: No oropharyngeal exudate.  Eyes:     General: Lids are normal.     Conjunctiva/sclera: Conjunctivae normal.  Cardiovascular:     Rate and Rhythm: Normal rate and regular rhythm.     Heart sounds: Normal heart sounds, S1 normal and S2 normal.  Pulmonary:     Breath sounds: Normal breath sounds. No decreased breath sounds, wheezing, rhonchi or rales.  Abdominal:     Palpations: Abdomen is soft.     Tenderness: There is abdominal tenderness in the right lower quadrant and left lower quadrant.  Musculoskeletal:     Right lower leg: No swelling.     Left lower leg: No swelling.  Skin:    General: Skin is warm.     Findings: No rash.  Neurological:     Mental Status: She is alert and oriented to person, place, and time.       Data Reviewed: Basic Metabolic Panel: Recent Labs  Lab  06/10/20 1101 06/13/20 0819 06/14/20 0738 06/15/20 0417  NA 140 139 138 138  K 4.3 3.9 3.8 3.8  CL 104 102 108 108  CO2 25 24 22 22   GLUCOSE 154* 150* 147* 171*  BUN 17 16 10 13   CREATININE 1.17 0.87 0.86 0.88  CALCIUM 9.3 10.0 8.2* 8.6*  MG  --   --  1.9  --   PHOS  --   --  3.0  --    Liver Function Tests: Recent Labs  Lab 06/13/20 0819  AST 22  ALT 24  ALKPHOS 77  BILITOT 0.6  PROT 8.7*  ALBUMIN 4.3   Recent Labs  Lab 06/13/20 0819  LIPASE 46   CBC: Recent Labs  Lab 06/13/20 0819 06/14/20 0738 06/15/20 0417  WBC 10.2 8.5 6.5  HGB 14.7 11.9* 12.4  HCT 45.1 36.6 38.8  MCV 87.9 88.4 89.6  PLT 282 237 235    CBG: Recent Labs  Lab 06/14/20 0748 06/14/20 1130 06/14/20 1608 06/14/20 2150 06/15/20 0741  GLUCAP 141* 120* 150* 154* 146*    Recent Results (from the past 240 hour(s))  Urine Culture     Status: Abnormal   Collection Time: 06/13/20  8:19 AM   Specimen: Urine, Clean Catch  Result Value Ref Range Status   Specimen  Description   Final    URINE, CLEAN CATCH Performed at [facility], [address] [place], [place] [address]    Special Requests   Final    NONE Performed at [facility], [place], [place] [address]    Culture >=100,000 COLONIES/mL ESCHERICHIA COLI (A)  Final   Report Status 06/15/2020 FINAL  Final   Organism ID, Bacteria ESCHERICHIA COLI (A)  Final      Susceptibility   Escherichia coli - MIC*    AMPICILLIN >=32 RESISTANT Resistant     CEFAZOLIN 16 SENSITIVE Sensitive     CEFEPIME <=0.12 SENSITIVE Sensitive     CEFTRIAXONE <=0.25 SENSITIVE Sensitive     CIPROFLOXACIN 1 SENSITIVE Sensitive     GENTAMICIN <=1 SENSITIVE Sensitive     IMIPENEM <=0.25 SENSITIVE Sensitive     NITROFURANTOIN <=16 SENSITIVE Sensitive     TRIMETH/SULFA <=20 SENSITIVE Sensitive     AMPICILLIN/SULBACTAM >=32 RESISTANT Resistant     PIP/TAZO <=4 SENSITIVE Sensitive     * >=100,000 COLONIES/mL ESCHERICHIA  COLI  Resp Panel by RT-PCR (Flu A&B, Covid) Nasopharyngeal Swab     Status: None   Collection Time: 06/13/20  1:08 PM   Specimen: Nasopharyngeal Swab; Nasopharyngeal(NP) swabs in vial transport medium  Result Value Ref Range Status   SARS Coronavirus 2 by RT PCR NEGATIVE NEGATIVE Final    Comment: (NOTE) SARS-CoV-2 target nucleic acids are NOT DETECTED.  The SARS-CoV-2 RNA is generally detectable in upper respiratory specimens during the acute phase of infection. The lowest concentration of SARS-CoV-2 viral copies this assay can detect is 138 copies/mL. A negative result does not preclude SARS-Cov-2 infection and should not be used as the sole basis for treatment or other patient management decisions. A negative result may occur with  improper specimen collection/handling, submission of specimen other than nasopharyngeal swab, presence of viral mutation(s) within the areas targeted by this assay, and inadequate number of viral copies(<138 copies/mL). A negative result must be combined with clinical observations, patient history, and epidemiological information. The expected result is Negative.  Fact Sheet for Patients:  EntrepreneurPulse.com.au  Fact Sheet for Healthcare Providers:  IncredibleEmployment.be  This test is no t yet approved or cleared by the [place] FDA and  has been authorized for detection and/or diagnosis of SARS-CoV-2 by FDA under an Emergency Use Authorization (EUA). This EUA will remain  in effect (meaning this test can be used) for the duration of the COVID-19 declaration under Section 564(b)(1) of the Act, 21 U.S.C.section 360bbb-3(b)(1), unless the authorization is terminated  or revoked sooner.       Influenza A by PCR NEGATIVE NEGATIVE Final   Influenza B by PCR NEGATIVE NEGATIVE Final    Comment: (NOTE) The Xpert Xpress SARS-CoV-2/FLU/RSV plus assay is intended as an aid in the diagnosis of influenza from  Nasopharyngeal swab specimens and should not be used as a sole basis for treatment. Nasal washings and aspirates are unacceptable for Xpert Xpress SARS-CoV-2/FLU/RSV testing.  Fact Sheet for Patients: EntrepreneurPulse.com.au  Fact Sheet for Healthcare Providers: IncredibleEmployment.be  This test is not yet approved or cleared by the [place] FDA and has been authorized for detection and/or diagnosis of SARS-CoV-2 by FDA under an Emergency Use Authorization (EUA). This EUA will remain in effect (meaning this test can be used) for the duration of the COVID-19 declaration under Section 564(b)(1) of the Act, 21 U.S.C. section 360bbb-3(b)(1), unless the authorization is terminated or revoked.  Performed at [facility], [place], [place] [address]      Studies: DG ABD ACUTE 2+V W 1V CHEST  Result Date: 06/14/2020 CLINICAL DATA:  55 year old with nausea and vomiting. EXAM: DG ABDOMEN ACUTE WITH 1 VIEW CHEST COMPARISON:  Chest radiograph 05/15/2019 and abdominal radiographs 10/12/2011 FINDINGS: Prominent interstitial lung markings are similar to the previous chest radiograph. There could be a component of chronic vascular congestion. Heart size is within normal limits and stable. No clear evidence for free air on these AP sitting views. Nonobstructive bowel gas pattern with gas in small and large bowel. No large abdominal calcifications. IMPRESSION: 1. Normal bowel gas pattern. 2. Prominent interstitial and vascular lung markings. Findings are similar to the previous examination. No acute cardiopulmonary disease. Electronically Signed   By: Markus Daft M.D.   On: 06/14/2020 08:27    Scheduled Meds: . baclofen  10 mg Oral TID  . budesonide  0.5 mg Inhalation Daily  . carvedilol  12.5 mg Oral BID WC  . DULoxetine  20 mg Oral QHS  . DULoxetine  40 mg Oral q morning  . enoxaparin (LOVENOX) injection  0.5 mg/kg Subcutaneous  Q24H  . gabapentin  1,200 mg Oral QHS  . gabapentin  600 mg Oral BID  . insulin aspart  0-15 Units Subcutaneous TID WC  . insulin aspart  0-5 Units Subcutaneous QHS  . insulin glargine  30 Units Subcutaneous QHS  . ipratropium-albuterol  3 mL Nebulization Daily  . levETIRAcetam  500 mg Oral q morning  . levETIRAcetam  750 mg Oral QHS  . losartan  100 mg Oral Daily  . mometasone-formoterol  2 puff Inhalation BID  . pantoprazole (PROTONIX) IV  40 mg Intravenous Q12H  . simethicone  80 mg Oral QID  . spironolactone  25 mg Oral Daily   Continuous Infusions: . sodium chloride 75 mL/hr at 06/14/20 1431  . piperacillin-tazobactam (ZOSYN)  IV 3.375 g (06/15/20 0551)    Assessment/Plan:  1. Partial small bowel obstruction.  Still having some abdominal pain.  Continue full liquid diet today.  Still no nausea or vomiting.  Continue supportive care.  Case discussed with general surgery. 2. Acute cystitis with hematuria.  Urine culture growing E. coli.  Switch antibiotics from Zosyn over the Rocephin. 3. Essential hypertension.  Continue oral medications.  Blood pressure was very high then IV hydralazine the blood pressure very low yesterday.  Continue oral medications only. 4. Type 2 diabetes mellitus with hyperlipidemia.  Holding simvastatin for right now.  Continue sliding scale insulin and glargine insulin.  Patient does use an insulin pump at home. 5. Obesity with a BMI of 42.52 and obstructive sleep apnea on CPAP 6. History of COPD.  Continue inhalers and nebulizers 7. Weakness and falls.  Physical therapy evaluation appreciated. 8. Nonobstructing kidney stones.  Will need urology follow-up as outpatient 9. 2.7 cm adrenal adenoma.  Will need follow-up as outpatient        Code Status:     Code Status Orders  (From admission, onward)         Start     Ordered   06/13/20 1412  Full code  Continuous        06/13/20 1414        Code Status History    Date Active Date Inactive  Code Status Order ID Comments User Context   05/16/2019 1907 05/21/2019 1801 Full Code [id number]  Athena Masse, MD ED   04/27/2019 1408 04/29/2019 2021 Full Code  [id number]  Ivor Costa, MD ED   02/22/2014 2044 03/01/2014 2044 Full Code [id number]  Berle Mull, MD Inpatient   09/24/2011 1356 09/25/2011 2010 Full Code [id number]  Roe Coombs, RN Inpatient   08/22/2011 1747 08/26/2011 2128 Full Code [id number]  Donzetta Kohut, RN Inpatient   05/20/2011 1920 05/21/2011 1045 Full Code [id number]  Chauncy Passy, MD ED   Advance Care Planning Activity    Advance Directive Documentation   Flowsheet Row Most Recent Value  Type of Advance Directive Healthcare Power of Attorney  Pre-existing out of facility DNR order (yellow form or pink MOST form) --  "MOST" Form in Place? --     Family Communication: Spoke with family while I was in the room with the patient Disposition Plan: Status is: Inpatient  Dispo: The patient is from: Home              Anticipated d/c is to: Home              Patient currently being treated for partial small bowel obstruction.  Still having symptoms and has not had a bowel movement or passed gas yet.   Difficult to place patient.  No.  Consultants:  Gastroenterology  Antibiotics:  Change antibiotics from Zosyn over to Rocephin  Time spent: 27 minutes  [place]

## 2020-06-16 ENCOUNTER — Inpatient Hospital Stay: Payer: Medicare Other

## 2020-06-16 DIAGNOSIS — R531 Weakness: Secondary | ICD-10-CM | POA: Diagnosis not present

## 2020-06-16 DIAGNOSIS — N3001 Acute cystitis with hematuria: Secondary | ICD-10-CM | POA: Diagnosis not present

## 2020-06-16 DIAGNOSIS — I10 Essential (primary) hypertension: Secondary | ICD-10-CM | POA: Diagnosis not present

## 2020-06-16 DIAGNOSIS — K56609 Unspecified intestinal obstruction, unspecified as to partial versus complete obstruction: Secondary | ICD-10-CM | POA: Diagnosis not present

## 2020-06-16 LAB — GLUCOSE, CAPILLARY
Glucose-Capillary: 125 mg/dL — ABNORMAL HIGH (ref 70–99)
Glucose-Capillary: 145 mg/dL — ABNORMAL HIGH (ref 70–99)
Glucose-Capillary: 108 mg/dL — ABNORMAL HIGH (ref 70–99)
Glucose-Capillary: 167 mg/dL — ABNORMAL HIGH (ref 70–99)

## 2020-06-16 LAB — BASIC METABOLIC PANEL
Anion gap: 5 (ref 5–15)
BUN: 11 mg/dL (ref 6–20)
CO2: 23 mmol/L (ref 22–32)
Calcium: 8.4 mg/dL — ABNORMAL LOW (ref 8.9–10.3)
Chloride: 111 mmol/L (ref 98–111)
Creatinine, Ser: 0.83 mg/dL (ref 0.44–1.00)
GFR, Estimated: >60 mL/min (ref >60)
Glucose, Bld: 135 mg/dL — ABNORMAL HIGH (ref 70–99)
Potassium: 3.9 mmol/L (ref 3.5–5.1)
Sodium: 139 mmol/L (ref 135–145)

## 2020-06-16 MED ORDER — DOCUSATE SODIUM 100 MG PO CAPS
100.0000 mg | ORAL_CAPSULE | Freq: Every day | ORAL | Status: DC | PRN
  Administered 2020-06-20: 100 mg via ORAL
  Filled 2020-06-16: qty 1

## 2020-06-16 MED ORDER — SIMVASTATIN 20 MG PO TABS
20.0000 mg | ORAL_TABLET | Freq: Every day | ORAL | Status: DC
  Administered 2020-06-16 – 2020-06-19 (×4): 20 mg via ORAL
  Filled 2020-06-16 (×5): qty 1

## 2020-06-16 NOTE — Progress Notes (Signed)
Patient ID: [name], female   DOB: [birth year], 55 y.o.   MRN: [id number] [facility] PROGRESS NOTE  [name] ZTI:[id number] DOB: November 08, 1965 DOA: 06/13/2020 PCP: Delsa Grana, [name]  HPI/Subjective: Patient stated she only passed a little bit of gas.  No bowel movements as of yet.  Still having some abdominal pain.  No nausea or vomiting.  Tolerating liquids.  Patient admitted with small bowel obstruction.  Still feels a little bit weaker on the right side.  Has baseline left-sided weakness from strokes.  Objective: Vitals:   06/16/20 0836 06/16/20 1151  BP:  (!) 158/90  Pulse: 85 78  Resp: 16 18  Temp:  98.4 F (36.9 C)  SpO2: 99% 100%    Intake/Output Summary (Last 24 hours) at 06/16/2020 1212 Last data filed at 06/16/2020 1155 Gross per 24 hour  Intake 578.32 ml  Output 2400 ml  Net -1821.68 ml   Filed Weights   06/13/20 0822 06/15/20 0551  Weight: 113.9 kg 115.9 kg    ROS: Review of Systems  Respiratory: Negative for cough and shortness of breath.   Cardiovascular: Negative for chest pain.  Gastrointestinal: Positive for abdominal pain and constipation. Negative for nausea and vomiting.   Exam: Physical Exam HENT:     Head: Normocephalic.     Mouth/Throat:     Pharynx: No oropharyngeal exudate.  Eyes:     General: Lids are normal.     Conjunctiva/sclera: Conjunctivae normal.  Cardiovascular:     Rate and Rhythm: Normal rate and regular rhythm.     Heart sounds: Normal heart sounds, S1 normal and S2 normal.  Pulmonary:     Breath sounds: Normal breath sounds. No decreased breath sounds, wheezing, rhonchi or rales.  Abdominal:     Palpations: Abdomen is soft.     Tenderness: There is abdominal tenderness in the epigastric area.  Musculoskeletal:     Right ankle: No swelling.     Left ankle: No swelling.  Skin:    General: Skin is warm.     Findings: No rash.  Neurological:     Mental Status: She is alert and oriented to person,  place, and time.     Comments: Right-sided 4+ out of 5 power, left arm 5 out of 5 power.  Left leg 4+ out of 5 power       Data Reviewed: Basic Metabolic Panel: Recent Labs  Lab 06/10/20 1101 06/13/20 0819 06/14/20 0738 06/15/20 0417 06/16/20 0634  NA 140 139 138 138 139  K 4.3 3.9 3.8 3.8 3.9  CL 104 102 108 108 111  CO2 25 24 22 22 23   GLUCOSE 154* 150* 147* 171* 135*  BUN 17 16 10 13 11   CREATININE 1.17 0.87 0.86 0.88 0.83  CALCIUM 9.3 10.0 8.2* 8.6* 8.4*  MG  --   --  1.9  --   --   PHOS  --   --  3.0  --   --    Liver Function Tests: Recent Labs  Lab 06/13/20 0819  AST 22  ALT 24  ALKPHOS 77  BILITOT 0.6  PROT 8.7*  ALBUMIN 4.3   Recent Labs  Lab 06/13/20 0819  LIPASE 46   CBC: Recent Labs  Lab 06/13/20 0819 06/14/20 0738 06/15/20 0417  WBC 10.2 8.5 6.5  HGB 14.7 11.9* 12.4  HCT 45.1 36.6 38.8  MCV 87.9 88.4 89.6  PLT 282 237 235    CBG: Recent Labs  Lab 06/15/20 1705 06/15/20  2142 06/16/20 0759 06/16/20 1145 06/16/20 1148  GLUCAP 146* 135* 125* <10* 145*    Recent Results (from the past 240 hour(s))  Urine Culture     Status: Abnormal   Collection Time: 06/13/20  8:19 AM   Specimen: Urine, Clean Catch  Result Value Ref Range Status   Specimen Description   Final    URINE, CLEAN CATCH Performed at [facility], [address], [place], [place] [address]    Special Requests   Final    NONE Performed at [facility], [place], [place] [address]    Culture >=100,000 COLONIES/mL ESCHERICHIA COLI (A)  Final   Report Status 06/15/2020 FINAL  Final   Organism ID, Bacteria ESCHERICHIA COLI (A)  Final      Susceptibility   Escherichia coli - MIC*    AMPICILLIN >=32 RESISTANT Resistant     CEFAZOLIN 16 SENSITIVE Sensitive     CEFEPIME <=0.12 SENSITIVE Sensitive     CEFTRIAXONE <=0.25 SENSITIVE Sensitive     CIPROFLOXACIN 1 SENSITIVE Sensitive     GENTAMICIN <=1 SENSITIVE Sensitive     IMIPENEM  <=0.25 SENSITIVE Sensitive     NITROFURANTOIN <=16 SENSITIVE Sensitive     TRIMETH/SULFA <=20 SENSITIVE Sensitive     AMPICILLIN/SULBACTAM >=32 RESISTANT Resistant     PIP/TAZO <=4 SENSITIVE Sensitive     * >=100,000 COLONIES/mL ESCHERICHIA COLI  Resp Panel by RT-PCR (Flu A&B, Covid) Nasopharyngeal Swab     Status: None   Collection Time: 06/13/20  1:08 PM   Specimen: Nasopharyngeal Swab; Nasopharyngeal(NP) swabs in vial transport medium  Result Value Ref Range Status   SARS Coronavirus 2 by RT PCR NEGATIVE NEGATIVE Final    Comment: (NOTE) SARS-CoV-2 target nucleic acids are NOT DETECTED.  The SARS-CoV-2 RNA is generally detectable in upper respiratory specimens during the acute phase of infection. The lowest concentration of SARS-CoV-2 viral copies this assay can detect is 138 copies/mL. A negative result does not preclude SARS-Cov-2 infection and should not be used as the sole basis for treatment or other patient management decisions. A negative result may occur with  improper specimen collection/handling, submission of specimen other than nasopharyngeal swab, presence of viral mutation(s) within the areas targeted by this assay, and inadequate number of viral copies(<138 copies/mL). A negative result must be combined with clinical observations, patient history, and epidemiological information. The expected result is Negative.  Fact Sheet for Patients:  EntrepreneurPulse.com.au  Fact Sheet for Healthcare Providers:  IncredibleEmployment.be  This test is no t yet approved or cleared by the [place] FDA and  has been authorized for detection and/or diagnosis of SARS-CoV-2 by FDA under an Emergency Use Authorization (EUA). This EUA will remain  in effect (meaning this test can be used) for the duration of the COVID-19 declaration under Section 564(b)(1) of the Act, 21 U.S.C.section 360bbb-3(b)(1), unless the authorization is terminated   or revoked sooner.       Influenza A by PCR NEGATIVE NEGATIVE Final   Influenza B by PCR NEGATIVE NEGATIVE Final    Comment: (NOTE) The Xpert Xpress SARS-CoV-2/FLU/RSV plus assay is intended as an aid in the diagnosis of influenza from Nasopharyngeal swab specimens and should not be used as a sole basis for treatment. Nasal washings and aspirates are unacceptable for Xpert Xpress SARS-CoV-2/FLU/RSV testing.  Fact Sheet for Patients: EntrepreneurPulse.com.au  Fact Sheet for Healthcare Providers: IncredibleEmployment.be  This test is not yet approved or cleared by the [place] FDA and has been  authorized for detection and/or diagnosis of SARS-CoV-2 by FDA under an Emergency Use Authorization (EUA). This EUA will remain in effect (meaning this test can be used) for the duration of the COVID-19 declaration under Section 564(b)(1) of the Act, 21 U.S.C. section 360bbb-3(b)(1), unless the authorization is terminated or revoked.  Performed at [facility], [address] [place], [place] [address]      Studies: CT HEAD WO CONTRAST  Result Date: 06/15/2020 CLINICAL DATA:  Increased right arm weakness. EXAM: CT HEAD WITHOUT CONTRAST TECHNIQUE: Contiguous axial images were obtained from the base of the skull through the vertex without intravenous contrast. COMPARISON:  02/04/2018 FINDINGS: Brain: No evidence of acute infarction, hemorrhage, extra-axial collection, ventriculomegaly, or mass effect. Generalized cerebral atrophy. Periventricular white matter low attenuation likely secondary to microangiopathy. Vascular: No hyperdense vessel. No significant intracranial atherosclerotic disease. Skull: Negative for fracture or focal lesion. Right frontal burr hole. Sinuses/Orbits: Visualized portions of the orbits are unremarkable. Visualized portions of the paranasal sinuses are unremarkable. Visualized portions of the mastoid air cells are  unremarkable. Other: None. IMPRESSION: No acute intracranial pathology. Electronically Signed   By: Kathreen Devoid   On: 06/15/2020 14:59    Scheduled Meds: . baclofen  10 mg Oral TID  . budesonide  0.5 mg Inhalation Daily  . carvedilol  12.5 mg Oral BID WC  . clopidogrel  75 mg Oral Daily  . DULoxetine  20 mg Oral QHS  . DULoxetine  40 mg Oral q morning  . enoxaparin (LOVENOX) injection  0.5 mg/kg Subcutaneous Q24H  . gabapentin  1,200 mg Oral QHS  . gabapentin  600 mg Oral BID  . insulin aspart  0-15 Units Subcutaneous TID WC  . insulin aspart  0-5 Units Subcutaneous QHS  . insulin glargine  30 Units Subcutaneous QHS  . ipratropium-albuterol  3 mL Nebulization Daily  . levETIRAcetam  500 mg Oral q morning  . levETIRAcetam  750 mg Oral QHS  . losartan  100 mg Oral Daily  . mometasone-formoterol  2 puff Inhalation BID  . pantoprazole (PROTONIX) IV  40 mg Intravenous Q12H  . simethicone  80 mg Oral QID  . spironolactone  25 mg Oral Daily   Continuous Infusions: . sodium chloride 75 mL/hr at 06/15/20 1545  . cefTRIAXone (ROCEPHIN)  IV 1 g (06/15/20 1407)    Assessment/Plan:  1. Partial small bowel obstruction.  Still having some abdominal pain.  Passed a little bit of gas but no bowel movements yet.  Continue full liquid diet.  No nausea or vomiting.  Continue supportive care with IV fluids.  General surgery following. 2. Right-sided weakness.  Neurology felt more functional in nature.  CT scan of the head negative for stroke.  I restarted Plavix yesterday. 3. Acute cystitis with hematuria.  Urine culture growing E. coli.  Continue Rocephin. 4. Type 2 diabetes mellitus with hyperlipidemia.  On low-dose Lantus and sliding scale.  Uses insulin pump at home. 5. Essential hypertension.  Continue Coreg and spironolactone. 6. Obesity with a BMI of 42.5 to and obstructive sleep apnea on CPAP 7. History of COPD.  Continue inhalers and nebulizers 8. Weakness and falls.  Appreciate physical  therapy evaluation 9. Nonobstructing kidney stones.  Will need urology follow-up as outpatient 10. 2.7 cm adrenal adenoma.  We will also need follow-up for this as outpatient        Code Status:     Code Status Orders  (From admission, onward)  Start     Ordered   06/13/20 1412  Full code  Continuous        06/13/20 1414        Code Status History    Date Active Date Inactive Code Status Order ID Comments User Context   05/16/2019 1907 05/21/2019 1801 Full Code [id number]  Athena Masse, MD ED   04/27/2019 1408 04/29/2019 2021 Full Code [id number]  Ivor Costa, MD ED   02/22/2014 2044 03/01/2014 2044 Full Code [id number]  Berle Mull, MD Inpatient   09/24/2011 1356 09/25/2011 2010 Full Code [id number]  [place], Gaye Alken, RN Inpatient   08/22/2011 3888 08/26/2011 2128 Full Code [id number]  Donzetta Kohut, RN Inpatient   05/20/2011 1920 05/21/2011 1045 Full Code [id number]  Chauncy Passy, MD ED   Advance Care Planning Activity    Advance Directive Documentation   Flowsheet Row Most Recent Value  Type of Advance Directive Healthcare Power of Attorney  Pre-existing out of facility DNR order (yellow form or pink MOST form) --  "MOST" Form in Place? --     Family Communication: Left message for daughter on the phone Disposition Plan: Status is: Inpatient  Dispo: The patient is from: Home              Anticipated d/c is to: Home              Patient currently still has not had a bowel movement and very little passing gas.  We will have to have things move through when tolerating solid diet before going home.   Difficult to place patient.  No.  Antibiotics:  Rocephin  Time spent: 27 minutes  [place]

## 2020-06-16 NOTE — TOC Initial Note (Signed)
Transition of Care [facility]) - Initial/Assessment Note    Patient Details  Name: [name] MRN: [id number] Date of Birth: 02-13-1965  [facility]) CM/SW Contact:    Candie Chroman, LCSW Phone Number: 06/16/2020, 1:34 PM  Clinical Narrative:  CSW met with patient. No supports at bedside. CSW introduced role and explained that therapy recommendations would be discussed. Patient agreeable to home health services. She said she had services after admission in April 2021. Per chart review she was set up with [facility]. Left message for their representative to see if they would be able to accept referral. Will call around to other agencies if not. Patient agreeable to outpatient therapy if we are unable to set up home health. Patient has a quad cane, shower chair, and bedside commode at home. Notified her of recommendation for a rolling walker but she prefers a rollator. No further concerns. CSW encouraged patient to contact CSW as needed. CSW will continue to follow patient for support and facilitate return home when stable.                Expected Discharge Plan: [place] Barriers to Discharge: Continued Medical Work up   Patient Goals and CMS Choice   CMS Medicare.gov Compare Post Acute Care list provided to:: Patient    Expected Discharge Plan and Services Expected Discharge Plan: [place] Choice: [place] arrangements for the past 2 months: Single Family Home                                      Prior Living Arrangements/Services Living arrangements for the past 2 months: Single Family Home Lives with:: Adult Children,Relatives Patient language and need for interpreter reviewed:: Yes Do you feel safe going back to the place where you live?: Yes      Need for Family Participation in Patient Care: Yes (Comment) Care giver support system in place?: Yes (comment)    Criminal Activity/Legal Involvement Pertinent to Current Situation/Hospitalization: No - Comment as needed  Activities of Daily Living Home Assistive Devices/Equipment: CPAP,Shower chair with back,Bedside commode/3-in-1,Grab bars in shower,Grab bars around toilet,Nebulizer,Eyeglasses ADL Screening (condition at time of admission) Patient's cognitive ability adequate to safely complete daily activities?: Yes Is the patient deaf or have difficulty hearing?: No Does the patient have difficulty seeing, even when wearing glasses/contacts?: Yes (legally blind left eye) Does the patient have difficulty concentrating, remembering, or making decisions?: No Patient able to express need for assistance with ADLs?: Yes Does the patient have difficulty dressing or bathing?: Yes Independently performs ADLs?: Yes (appropriate for developmental age) Does the patient have difficulty walking or climbing stairs?: Yes Weakness of Legs: Both Weakness of Arms/Hands: Left  Permission Sought/Granted Permission sought to share information with : Facility Art therapist granted to share information with : Yes, Verbal Permission Granted     Permission granted to share info w AGENCY: Home Health Agencies        Emotional Assessment Appearance:: Appears stated age Attitude/Demeanor/Rapport: Engaged,Gracious Affect (typically observed): Accepting,Appropriate,Calm,Pleasant Orientation: : Oriented to Self,Oriented to Place,Oriented to  Time,Oriented to Situation Alcohol / Substance Use: Not Applicable Psych Involvement: No (comment)  Admission diagnosis:  Ileus (HCC) [K56.7] Nausea & vomiting [R11.2] Lower urinary tract infection, acute [N39.0] Non-intractable vomiting with nausea, unspecified vomiting type [R11.2] Patient Active Problem List  Diagnosis Date Noted  . SBO (small bowel obstruction) ([place])   . Acute cystitis with hematuria   . Right sided weakness   . Adrenal adenoma, right    . Nausea & vomiting 06/13/2020  . Ileus ([place]) 06/13/2020  . PAD (peripheral artery disease) ([place]) 01/24/2020  . Leg pain 01/24/2020  . Leg pain, bilateral 01/11/2020  . Numbness and tingling 01/11/2020  . Abnormal ankle brachial index (ABI) 12/18/2019  . Hypoglycemia 05/17/2019  . Hypoglycemia associated with type 2 diabetes mellitus ([place]) 05/16/2019  . Chest pain 04/27/2019  . Type 2 diabetes mellitus without complication, with long-term current use of insulin ([place]) 04/27/2019  . COPD (chronic obstructive pulmonary disease) ([place]) 04/27/2019  . CAD (coronary artery disease) 04/27/2019  . H/O: stroke with residual effects   . Myalgia 04/19/2019  . 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 Non-intractable vomiting 03/01/2019  . History of CVA (cerebrovascular accident) 07/08/2018  . History of seizure 07/08/2018  . Diabetic polyneuropathy associated with type 2 diabetes mellitus ([place]) 03/02/2018  . Obesity (BMI 30-39.9) 03/02/2018  . Vitamin D deficiency 01/24/2018  . Vulvovaginal candidiasis 01/24/2018  . Hypokalemia 06/08/2017  . Overactive bladder 05/18/2017  . Asthma 06/22/2016  . Dilated aortic root ([place])   . Sacroiliac dysfunction 10/09/2014  . Chronic low back pain 09/03/2014  . CVA, old, hemiparesis ([place]) 09/03/2014  . OSA (obstructive sleep apnea) 05/15/2014  . Restrictive airway disease 05/15/2014  . Colitis 02/22/2014  . Type 2 diabetes mellitus with hyperlipidemia ([place]) 01/21/2014  . Chronic pain syndrome 01/21/2014  . Headache disorder 09/07/2013  . Diabetic neuropathy ([place]) 05/23/2013  . Mixed urge and stress incontinence 08/25/2011  . Ureteral stricture, right 08/25/2011  . Pyelonephritis 08/22/2011  . Nephrolithiasis 08/22/2011  . Incisional hernia 05/10/2011  . Class 3 severe obesity with serious comorbidity and body mass index (BMI) of 40.0 to 44.9 in adult ([place]) 05/10/2011  . Tachycardia 05/08/2011  . BLINDNESS, [place], [place] DEFINITION 09/14/2006  .  Hyperlipemia 09/13/2006  . Essential hypertension 09/13/2006  . MYOCARDIAL INFARCTION, HX OF 09/13/2006  . GERD 09/13/2006  . Seizure disorder ([place]) 09/13/2006   PCP:  Delsa Grana, [name] Pharmacy:   [id number] [id number]: [phone]: [phone]    Social Determinants of Health (SDOH) Interventions    Readmission Risk Interventions No flowsheet data found.

## 2020-06-16 NOTE — Progress Notes (Signed)
Subjective:  CC: [name] is a 54 y.o. female  Hospital stay day 3,   abdominal pain  HPI: No acute changes, passing more flatus but still no BM.  Tolerating full liquid.  Passing flatus has not improved pain.  ROS:  General: Denies weight loss, weight gain, fatigue, fevers, chills, and night sweats. Heart: Denies chest pain, palpitations, racing heart, irregular heartbeat, leg pain or swelling, and decreased activity tolerance. Respiratory: Denies breathing difficulty, shortness of breath, wheezing, cough, and sputum. GI: Denies change in appetite, heartburn, nausea, vomiting, constipation, diarrhea, and blood in stool. GU: Denies difficulty urinating, pain with urinating, urgency, frequency, blood in urine.   Objective:   Temp:  [97.4 F (36.3 C)-98.4 F (36.9 C)] 98.4 F (36.9 C) (05/29 1151) Pulse Rate:  [72-85] 78 (05/29 1151) Resp:  [16-18] 18 (05/29 1151) BP: (136-175)/(78-109) 158/90 (05/29 1151) SpO2:  [98 %-100 %] 100 % (05/29 1151)     Height: 5' 5"  (165.1 cm) Weight: 115.9 kg BMI (Calculated): 42.52   Intake/Output this shift:   Intake/Output Summary (Last 24 hours) at 06/16/2020 1217 Last data filed at 06/16/2020 1155 Gross per 24 hour  Intake 578.32 ml  Output 2400 ml  Net -1821.68 ml    Constitutional :  alert, cooperative, appears stated age and no distress  Respiratory:  clear to auscultation bilaterally  Cardiovascular:  regular rate and rhythm  Gastrointestinal: soft, no guarding. TTP in LUQ only today.   Skin: Cool and moist.   Psychiatric: Normal affect, non-agitated, not confused       LABS:  CMP Latest Ref Rng & Units 06/16/2020 06/15/2020 06/14/2020  Glucose 70 - 99 mg/dL 135(H) 171(H) 147(H)  BUN 6 - 20 mg/dL 11 13 10   Creatinine 0.44 - 1.00 mg/dL 0.83 0.88 0.86  Sodium 135 - 145 mmol/L 139 138 138  Potassium 3.5 - 5.1 mmol/L 3.9 3.8 3.8  Chloride 98 - 111 mmol/L 111 108 108  CO2 22 - 32 mmol/L 23 22 22   Calcium 8.9 - 10.3 mg/dL  8.4(L) 8.6(L) 8.2(L)  Total Protein 6.5 - 8.1 g/dL - - -  Total Bilirubin 0.3 - 1.2 mg/dL - - -  Alkaline Phos 38 - 126 U/L - - -  AST 15 - 41 U/L - - -  ALT 0 - 44 U/L - - -   CBC Latest Ref Rng & Units 06/15/2020 06/14/2020 06/13/2020  WBC 4.0 - 10.5 K/uL 6.5 8.5 10.2  Hemoglobin 12.0 - 15.0 g/dL 12.4 11.9(L) 14.7  Hematocrit 36.0 - 46.0 % 38.8 36.6 45.1  Platelets 150 - 400 K/uL 235 237 282    RADS: n/a Assessment:   Abdominal pain.  Still no obvious etiology but still doubt any acute surgical issue since she is still tolerating full liquid diet and pain is inconsistent, possibly better on exam today?  Recommend staying on full liquid for another day and continue to monitor.  Colace dailyin case she is dealing with constipation now?  She reports daily BMs prior to hospitalization.

## 2020-06-16 NOTE — Progress Notes (Signed)
Brief Neurology Update  MRI brain reviewed and showed no e/o acute infarct. Favor recrudescence of prior stroke sx vs embellishment on exam. No further inpatient neurologic w/u indicated. Continue plavix.  Neurology to sign off, but please re-engage if additional neurologic concerns arise.  Su Monks, MD [facility] [phone]  If 7pm- 7am, please page neurology on call as listed in [place].

## 2020-06-16 NOTE — Consult Note (Signed)
NEUROLOGY CONSULTATION NOTE   Date of service: Jun 16, 2020 Patient Name: [name] MRN:  [id number] DOB:  28-Sep-1965 Reason for consult: acute on chronic R sided weakness c/f acute stroke _ _ _   _ __   _ __ _ _  __ __   _ __   __ _  History of Present Illness   [name] is a 55 y.o. female with PMH significant for  has a past medical history of Anxiety and depression (09/13/2006), Arthritis, Asthma, COPD (chronic obstructive pulmonary disease) ([place]), Depression, Diabetes mellitus, Diabetic neuropathy, painful ([place]), Diabetic retinopathy ([place]), Diastolic dysfunction, Dilated aortic root ([place]), Dizziness, Epilepsy idiopathic petit mal ([place]), GERD (gastroesophageal reflux disease), Glaucoma, Headache(784.0), Heart murmur, History of stress test, blood clots, Hyperlipidemia, Hypertension, Legally blind, MIGRAINE HEADACHE (09/14/2006), Myocardial infarction ([place]), Nephrolithiasis, Pain (09/23/11), Pneumonia, Pyelonephritis, Seizures ([place]), Sleep apnea, Stress incontinence, and Stroke ([place]). She is followed by Dr. Melrose Nakayama at [facility] for hx stroke and seizures. She was previously on keppra but is no longer taking it. Per Dr. Lannie Fields note she has had multiple strokes in the past with the most recent being in 2013.   She is admitted for SBO and has been managed medically thus far this hospitalization. She is taking plavix for secondary stroke prevention but it has been held since admission in case she may need surgery. This afternoon there was concern on behalf of nursing staff that she had incoordination of her R hand while eating and had worsening R sided weakness. I discussed this w/ Dr. Leslye Peer over the phone and based on our discussion activated a stroke code. When I arrived patient told me that she had been experiencing these sx for variable periods of time (a few days vs one day vs since this AM). Without ability to confirm LKW she was not a tPA candidate. CTH showed NAICP. Her exam was  highly functional with motor strength barely-antigravity with giveway weakness and limb striking bed in each extremity. However when asked to do FNF testing she was able to do that without incoordination and with apparent full strength in each extremity. Due to non-localizing sx favored to be embellished if not entirely functional, and inability to confirm LKW, stroke code was cancelled.  ROS   10 point review of systems was performed and was negative except as described in HPI.  Past History   Past Medical History:  Diagnosis Date  . Anxiety and depression 09/13/2006   Qualifier: Diagnosis of  By: Hassell Done [name], Tori Milks    . Arthritis    joint pain   . Asthma   . COPD (chronic obstructive pulmonary disease) (H[facility])    chronic bronchitis   . Depression   . Diabetes mellitus    since age 73; type 2 IDDM  . Diabetic neuropathy, painful ([place])    FEET AND HANDS  . Diabetic retinopathy ([place])   . Diastolic dysfunction    a.  Echo 11/17: EF 60-65%, mild LVH, no RWMA, Gr1DD, mild AI, dilated aortic root measuring 38 mm, mildly dilated ascending aorta  . Dilated aortic root ([place])    29m by echo 11/2015  . Dizziness    secondary to diabetes and hypertension   . Epilepsy idiopathic petit mal ([place]    last seizure 2012;controlled w/ topomax  . GERD (gastroesophageal reflux disease)   . Glaucoma    NOT ON ANY EYE DROPS   . Headache(784.0)    migraines   . Heart murmur  born with   . History of stress test    a. 11/17: Normal perfusion, EF 53%, normal study  . Hx of blood clots    hematomas removed from left side of brain from 84mo to 53yrold   . Hyperlipidemia   . Hypertension   . Legally blind    left eye   . MIGRAINE HEADACHE 09/14/2006   Qualifier: Diagnosis of  By: MaHassell DoneNP, NyTori Milks  . Myocardial infarction ([place]   a.  Patient reported history of without objective documentation  . Nephrolithiasis    frequent urination , urination at nite  PT SEEN IN ER 09/22/11 FOR BACK  AND RT SIDED PAIN--HAS KNOWN STONE RT URETER AND UA IN ER SHOWED UTI  . Pain 09/23/11   LOWER BACK AND RIGHT SIDE--PT HAS RIGHT URETERAL STONE  . Pneumonia    hx of 2009  . Pyelonephritis   . Seizures ([place]  . Sleep apnea    [facility] ; mild  . Stress incontinence   . [facility]   last [facility]  RESIDUAL LEFT LEG WEAKNESS--NO OTHER RESIDUAL PROBLEMS   Past Surgical History:  Procedure Laterality Date  . BRAIN HEMATOMA EVACUATION     five procedures total, first procedure when [age] monthsld, last at [age]ears of age.  . CYSTOSCOPY W/ RETROGRADES  09/24/2011   Procedure: CYSTOSCOPY WITH RETROGRADE PYELOGRAM;  Surgeon: JoMalka SoMD;  Location: [facility];  Service: Urology;  Laterality: Right;  . CYSTOSCOPY WITH URETEROSCOPY  09/24/2011   Procedure: CYSTOSCOPY WITH URETEROSCOPY;  Surgeon: JoMalka SoMD;  Location: [facility];  Service: Urology;  Laterality: Right;  Balloon dilation right ureter   . CYSTOSCOPY/RETROGRADE/URETEROSCOPY  08/24/2011   Procedure: CYSTOSCOPY/RETROGRADE/URETEROSCOPY;  Surgeon: DaMolli HazardMD;  Location: [facility];  Service: Urology;  Laterality: Right;  Cysto, Right retrograde Pyelogram, right stent placement.   . Fatima BlankERNIA REPAIR  05/05/2011   Procedure: LAPAROSCOPIC INCISIONAL HERNIA;  Surgeon: MaRolm BookbinderMD;  Location: [facility] Service: General;  Laterality: N/A;  . kidney stone removal    . MASS EXCISION  05/05/2011   Procedure: EXCISION MASS;  Surgeon: MaRolm BookbinderMD;  Location: [facility] Service: General;  Laterality: Right;  . PCNL    . RETINAL DETACHMENT SURGERY Left 1990  . RIGHT/LEFT HEART CATH AND CORONARY ANGIOGRAPHY N/A 04/28/2019   Procedure: RIGHT/LEFT HEART CATH AND CORONARY ANGIOGRAPHY;  Surgeon: CaYolonda KidaMD;  [place] [facility];  Service: Cardiovascular;  Laterality: N/A;  . SHUNT REMOVAL     shunt inserted at age [age]emoved at age 55 . VAGINAL HYSTERECTOMY  1996    Family History  Problem Relation Age of Onset  . Uterine cancer Mother   . Hypertension Mother   . Cancer Mother   . Brain cancer Maternal Grandmother   . Hypertension Maternal Grandmother   . Birth defects Daughter   . Hypertension Daughter   . Cirrhosis Maternal Grandfather   . ADD / ADHD Maternal Grandfather   . Birth defects Maternal Grandfather   . Diabetes Maternal Grandfather   . Anesthesia problems Neg Hx   . Colon cancer Neg Hx   . Esophageal cancer Neg Hx   . Pancreatic cancer Neg Hx    Social History   Socioeconomic History  . Marital status: Widowed    Spouse name: Not on file  . Number of children: 2  .  Years of education: Not on file  . Highest education level: Not on file  Occupational History  . Occupation: disabled  Tobacco Use  . Smoking status: Former Smoker    Packs/day: 1.00    Years: 15.00    Pack years: 15.00    Types: Cigarettes    Quit date: 01/20/1987    Years since quitting: 33.4  . Smokeless tobacco: Never Used  . Tobacco comment: quit more than 20 years  Vaping Use  . Vaping Use: Never used  Substance and Sexual Activity  . Alcohol use: Never    Alcohol/week: 0.0 standard drinks  . Drug use: No    Comment: hx of marijuana use no longer uses   . Sexual activity: Yes    Partners: Male    Birth control/protection: Surgical  Other Topics Concern  . Not on file  Social History Narrative   Pt lives with her daughter   Social Determinants of Health   Financial Resource Strain: Low Risk   . Difficulty of Paying Living Expenses: Not very hard  Food Insecurity: No Food Insecurity  . Worried About Charity fundraiser in the Last Year: Never true  . Ran Out of Food in the Last Year: Never true  Transportation Needs: No Transportation Needs  . Lack of Transportation (Medical): No  . Lack of Transportation (Non-Medical): No  Physical Activity: Insufficiently Active  . Days of Exercise per Week: 2 days  . Minutes of Exercise per  Session: 20 min  Stress: No Stress Concern Present  . Feeling of Stress : Not at all  Social Connections: Socially Isolated  . Frequency of Communication with Friends and Family: More than three times a week  . Frequency of Social Gatherings with Friends and Family: More than three times a week  . Attends Religious Services: Never  . Active Member of Clubs or Organizations: No  . Attends Archivist Meetings: Never  . Marital Status: Widowed   Allergies  Allergen Reactions  . Codeine Swelling and Other (See Comments)    Swelling and burning of mouth (inside)  . Lactose Intolerance (Gi) Nausea And Vomiting    Medications   Medications Prior to Admission  Medication Sig Dispense Refill Last Dose  . albuterol (ACCUNEB) 1.25 MG/3ML nebulizer solution Inhale 1 ampule into the lungs every 6 (six) hours as needed for wheezing or shortness of breath.   Unknown at PRN  . albuterol (PROAIR HFA) 108 (90 Base) MCG/ACT inhaler INHALE 2 PUFFS BY MOUTH EVERY 6 HOURS AS NEEDED FOR WHEEZING OR SHORTNESS OF BREATH (Patient taking differently: Inhale 2 puffs into the lungs every 6 (six) hours as needed for wheezing or shortness of breath.) 3 Inhaler 3 Unknown at PRN  . baclofen (LIORESAL) 10 MG tablet Take 1 tablet (10 mg total) by mouth 3 (three) times daily. 90 tablet 6 06/12/2020 at 1900  . budesonide (PULMICORT) 0.5 MG/2ML nebulizer solution Inhale 0.5 mg into the lungs daily.   06/12/2020 at Unknown  . carvedilol (COREG) 12.5 MG tablet Take 12.5 mg by mouth 2 (two) times daily with a meal.   06/12/2020 at 1900  . clopidogrel (PLAVIX) 75 MG tablet Take 1 tablet (75 mg total) by mouth daily. 90 tablet 0 06/12/2020 at 0800  . cycloSPORINE (RESTASIS) 0.05 % ophthalmic emulsion Place 2 drops into both eyes 2 (two) times daily.     . dapagliflozin propanediol (FARXIGA) 10 MG TABS tablet Take 1 tablet (10 mg total) by mouth daily before  breakfast. 30 tablet 2 06/12/2020 at 0800  . Dulaglutide  (TRULICITY) 3 PV/9.4IA SOPN Inject 3 mg into the skin once a week. (Patient taking differently: Inject 3 mg into the skin every Friday.) 2 mL 2 06/07/2020 at 2100  . DULoxetine (CYMBALTA) 20 MG capsule TAKE 2 CAPSULES EVERY MORNING AND 1 CAPSULE EVERY NIGHT. (Patient taking differently: Take 20-40 mg by mouth See admin instructions. Take 2 capsules (72m) by mouth every morning and take 1 capsule (277m by mouth every night) 270 capsule 1 06/12/2020 at 2100  . DUPIXENT 300 MG/2ML SOPN Inject 300 mg into the skin every 14 (fourteen) days.     . Marland KitchenPINEPHrine 0.3 mg/0.3 mL IJ SOAJ injection Inject 0.3 mg into the muscle as needed for anaphylaxis.   Unknown at PRN  . ezetimibe (ZETIA) 10 MG tablet Take 1 tablet (10 mg total) by mouth daily. 90 tablet 3 Unknown at Unknown  . fluticasone (FLONASE) 50 MCG/ACT nasal spray Place 2 sprays into both nostrils daily. 16 g 3   . gabapentin (NEURONTIN) 600 MG tablet 600 mg in the morning, 600 mg in the afternoon, 1200 mg nightly. (Patient taking differently: Take 600-1,200 mg by mouth See admin instructions. Take 1 tablet (60039mby mouth every morning and afternoon and then take 2 tablets (1200m67my mouth every night) 120 tablet 2 06/12/2020 at 2100  . glucose blood (ACCU-CHEK GUIDE) test strip Use as instructed to check blood sugar 4 times per day Dx code E11.9 400 strip 3   . insulin lispro (HUMALOG) 100 UNIT/ML injection USE UP TO 150 UNITS/DAY IN INSULIN PUMP 110 mL 1 As directed at Unknown  . levETIRAcetam (KEPPRA) 500 MG tablet Take 500-750 mg by mouth See admin instructions. Take 1 tablet (500mg25m mouth every morning and take 1 tablets (750mg)53mmouth every night   06/12/2020 at 1900  . losartan (COZAAR) 100 MG tablet Take 1 tablet (100 mg total) by mouth daily. 90 tablet 3 Unknown at Unknown  . mirabegron ER (MYRBETRIQ) 50 MG TB24 tablet Take 1 tablet (50 mg total) by mouth daily. 90 tablet 1 Unknown at Unknown  . nitroGLYCERIN (NITROSTAT) 0.4 MG SL tablet  Place 0.4 mg under the tongue every 5 (five) minutes as needed for chest pain.   Unknown at PRN  . pantoprazole (PROTONIX) 40 MG tablet Take 1 tablet (40 mg total) by mouth daily. 90 tablet 0 06/12/2020 at 0800  . polyethylene glycol (MIRALAX / GLYCOLAX) 17 g packet Take 17 g by mouth daily as needed for mild constipation. 100 each 3 Unknown at PRN  . potassium chloride SA (KLOR-CON M20) 20 MEQ tablet You have not needed potassium while in the hospital.  Hold until outpatient followup. (Patient taking differently: Take 20 mEq by mouth 2 (two) times daily.) 60 tablet 5 06/12/2020 at 1800  . simvastatin (ZOCOR) 20 MG tablet Take 20 mg by mouth daily.   Unknown at Unknown  . spironolactone (ALDACTONE) 25 MG tablet Take 25 mg by mouth daily.   06/12/2020 at Unknown  . terconazole (TERAZOL 7) 0.4 % vaginal cream Place 1 applicator vaginally at bedtime as needed (irritation). 45 g 0 Unknown at PRN  . WIXELA INHUB 250-50 MCG/DOSE AEPB Inhale 1 puff into the lungs 2 (two) times daily.   06/12/2020 at 1900  . Accu-Chek Softclix Lancets lancets Use to check blood sugar 4 times per day Dx code E11.9 (Patient not taking: No sig reported) 400 each 1   . Blood Glucose Monitoring  Suppl (ACCU-CHEK AVIVA PLUS) w/Device KIT Use to check blood sugar 4 times per day, DX code E11.9 (Patient not taking: No sig reported) 1 kit 0   . Blood Glucose Monitoring Suppl (ACCU-CHEK GUIDE) w/Device KIT Use to check blood sugar 4 times per day Dx code E11.9 (Patient not taking: No sig reported) 1 kit 0   . Continuous Blood Gluc Sensor (FREESTYLE LIBRE 2 SENSOR) MISC Use 1 sensor every 14 days (Patient not taking: No sig reported) 6 each 1      Vitals   Vitals:   06/16/20 0600 06/16/20 0758 06/16/20 0836 06/16/20 1151  BP: (!) 153/95 (!) 175/109  (!) 158/90  Pulse: 73 72 85 78  Resp: 16 18 16 18   Temp: 97.7 F (36.5 C) 98 F (36.7 C)  98.4 F (36.9 C)  TempSrc:  Oral    SpO2: 98% 98% 99% 100%  Weight:      Height:          Body mass index is 42.52 kg/m.  Physical Exam   Physical Exam Gen: A&O x4, NAD Resp: CTAB, no w/r/r CV: RRR, no m/g/r; nml S1 and S2. 2+ symmetric peripheral pulses.  Neuro: *MS: A&O x4. Follows multi-step commands.  *Speech: mild dysarthria, no aphasia *CN:    I: Deferred   II,III: PERRLA, VFF by confrontation   III,IV,VI: EOMI w/o nystagmus, no ptosis   V: Sensation intact from V1 to V3 to LT   VII: Eyelid closure was full.  R NLF flattening   VIII: Hearing intact to voice   IX,X: Voice normal, palate elevates symmetrically    XI: SCM/trap 5/5 bilat   XII: Tongue protrudes midline, no atrophy or fasciculations   *Motor:   Normal bulk.  No tremor, rigidity or bradykinesia. Barely anti-gravity in any extremity with each limb falling to bed within seconds. Note discrepancy with FNF testing below. *Sensory: Impaired to LT L side *Coordination:  FNF intact with apparent full strength and without dysmetria or ataxia *Reflexes:  2+ and symmetric throughout without clonus; toes down-going bilat *Gait: deferred  NIHSS  1a Level of Conscious.: 0 1b LOC Questions: 0 1c LOC Commands: 0 2 Best Gaze: 0 3 Visual: 0 4 Facial Palsy: 1 5a Motor Arm - left: 2 5b Motor Arm - Right: 2 6a Motor Leg - Left: 2 6b Motor Leg - Right: 2 7 Limb Ataxia: 0 8 Sensory: 1 9 Best Language: 0 10 Dysarthria: 1 11 Extinct. and Inatten.: 0  TOTAL: 11   Premorbid mRS = 3   Labs   CBC:  Recent Labs  Lab 06/14/20 0738 06/15/20 0417  WBC 8.5 6.5  HGB 11.9* 12.4  HCT 36.6 38.8  MCV 88.4 89.6  PLT 237 102    Basic Metabolic Panel:  Lab Results  Component Value Date   NA 139 06/16/2020   K 3.9 06/16/2020   CO2 23 06/16/2020   GLUCOSE 135 (H) 06/16/2020   BUN 11 06/16/2020   CREATININE 0.83 06/16/2020   CALCIUM 8.4 (L) 06/16/2020   GFRNONAA >60 06/16/2020   GFRAA >60 05/30/2019   Lipid Panel:  Lab Results  Component Value Date   LDLCALC 93 06/10/2020   HgbA1c:  Lab  Results  Component Value Date   HGBA1C 7.8 (H) 06/10/2020   Urine Drug Screen:     Component Value Date/Time   LABOPIA NONE DETECTED 04/27/2019 1352   COCAINSCRNUR NONE DETECTED 04/27/2019 1352   COCAINSCRNUR NEG 09/03/2014 1331   LABBENZ NONE DETECTED  04/27/2019 1352   AMPHETMU NONE DETECTED 04/27/2019 1352   AMPHETMU NEG 09/03/2014 1331   THCU NONE DETECTED 04/27/2019 1352   LABBARB NONE DETECTED 04/27/2019 1352     Impression   Ms. Huettner is a 56 yo woman with hx multiple prior strokes per report most recently in 2013 admitted for SBO who reported worsening incoordination of R hand since at least yesterday. Her exam is highly embellished however small lacunar infarct causing clumsy hand syndrome cannot be ruled out. Sx since yesterday would not be c/w TIA.  Recommendations   - Restart plavix 38m for secondary stroke prevention - MRI without contrast r/o small acute infarct. If MRI is negative, no indication for further stroke w/u - F/u as scheduled with established outpatient neurologist Dr. PMelrose Nakayamaafter discharge  Neurology will follow up on MRI brain. If negative, no further inpatient neurologic w/u indicated. If it shows an acute infarct we will make additional recommendations for stroke w/u at that time. ______________________________________________________________________   Thank you for the opportunity to take part in the care of this patient. If you have any further questions, please contact the neurology consultation attending.  Signed,  CSu Monks MD [facility] [phone] If 7pm- 7am, please page neurology on call as listed in [place]

## 2020-06-17 DIAGNOSIS — R1013 Epigastric pain: Secondary | ICD-10-CM

## 2020-06-17 DIAGNOSIS — R1011 Right upper quadrant pain: Secondary | ICD-10-CM

## 2020-06-17 DIAGNOSIS — K56609 Unspecified intestinal obstruction, unspecified as to partial versus complete obstruction: Secondary | ICD-10-CM | POA: Diagnosis not present

## 2020-06-17 DIAGNOSIS — I10 Essential (primary) hypertension: Secondary | ICD-10-CM | POA: Diagnosis not present

## 2020-06-17 DIAGNOSIS — N3001 Acute cystitis with hematuria: Secondary | ICD-10-CM | POA: Diagnosis not present

## 2020-06-17 DIAGNOSIS — R531 Weakness: Secondary | ICD-10-CM | POA: Diagnosis not present

## 2020-06-17 LAB — BASIC METABOLIC PANEL
Anion gap: 6 (ref 5–15)
BUN: 9 mg/dL (ref 6–20)
CO2: 25 mmol/L (ref 22–32)
Calcium: 8.6 mg/dL — ABNORMAL LOW (ref 8.9–10.3)
Chloride: 109 mmol/L (ref 98–111)
Creatinine, Ser: 0.83 mg/dL (ref 0.44–1.00)
GFR, Estimated: >60 mL/min (ref >60)
Glucose, Bld: 140 mg/dL — ABNORMAL HIGH (ref 70–99)
Potassium: 3.9 mmol/L (ref 3.5–5.1)
Sodium: 140 mmol/L (ref 135–145)

## 2020-06-17 LAB — GLUCOSE, CAPILLARY
Glucose-Capillary: 120 mg/dL — ABNORMAL HIGH (ref 70–99)
Glucose-Capillary: 117 mg/dL — ABNORMAL HIGH (ref 70–99)
Glucose-Capillary: 154 mg/dL — ABNORMAL HIGH (ref 70–99)
Glucose-Capillary: 162 mg/dL — ABNORMAL HIGH (ref 70–99)

## 2020-06-17 MED ORDER — HYDRALAZINE HCL 50 MG PO TABS
50.0000 mg | ORAL_TABLET | Freq: Three times a day (TID) | ORAL | Status: DC | PRN
  Administered 2020-06-17: 50 mg via ORAL
  Filled 2020-06-17: qty 1

## 2020-06-17 MED ORDER — HYDROCHLOROTHIAZIDE 12.5 MG PO CAPS
12.5000 mg | ORAL_CAPSULE | Freq: Every day | ORAL | Status: DC
  Administered 2020-06-17: 12.5 mg via ORAL
  Filled 2020-06-17: qty 1

## 2020-06-17 NOTE — Progress Notes (Signed)
Subjective:  CC: [name] is a 55 y.o. female  Hospital stay day 4,   abdominal pain  HPI: Eating a regular diet at the time of my evaluation.  She is having bowel movements, but still endorses some epigastric and right upper quadrant discomfort.  ROS:  General: Denies weight loss, weight gain, fatigue, fevers, chills, and night sweats. Heart: Denies chest pain, palpitations, racing heart, irregular heartbeat, leg pain or swelling, and decreased activity tolerance. Respiratory: Denies breathing difficulty, shortness of breath, wheezing, cough, and sputum. GI: Denies change in appetite, heartburn, nausea, vomiting, constipation, diarrhea, and blood in stool. GU: Denies difficulty urinating, pain with urinating, urgency, frequency, blood in urine.   Objective:   Temp:  [97.4 F (36.3 C)-98.4 F (36.9 C)] 97.5 F (36.4 C) (05/30 0414) Pulse Rate:  [65-78] 67 (05/30 0833) Resp:  [15-22] 15 (05/30 0833) BP: (157-186)/(84-99) 157/84 (05/30 0414) SpO2:  [97 %-100 %] 98 % (05/30 0833) Weight:  [118.4 kg] 118.4 kg (05/30 0414)     Height: 5' 5"  (165.1 cm) Weight: 118.4 kg BMI (Calculated): 43.43   Intake/Output this shift:   Intake/Output Summary (Last 24 hours) at 06/17/2020 0933 Last data filed at 06/17/2020 0038 Gross per 24 hour  Intake 1355.3 ml  Output 500 ml  Net 855.3 ml    Constitutional :  alert, cooperative, appears stated age and no distress  Respiratory:  clear to auscultation bilaterally  Cardiovascular:  regular rate and rhythm  Gastrointestinal: soft, no guarding. TTP in right upper quadrant only today.   Skin: Cool and moist.   Psychiatric: Normal affect, non-agitated, not confused       LABS:  CMP Latest Ref Rng & Units 06/17/2020 06/16/2020 06/15/2020  Glucose 70 - 99 mg/dL 140(H) 135(H) 171(H)  BUN 6 - 20 mg/dL 9 11 13   Creatinine 0.44 - 1.00 mg/dL 0.83 0.83 0.88  Sodium 135 - 145 mmol/L 140 139 138  Potassium 3.5 - 5.1 mmol/L 3.9 3.9 3.8   Chloride 98 - 111 mmol/L 109 111 108  CO2 22 - 32 mmol/L 25 23 22   Calcium 8.9 - 10.3 mg/dL 8.6(L) 8.4(L) 8.6(L)  Total Protein 6.5 - 8.1 g/dL - - -  Total Bilirubin 0.3 - 1.2 mg/dL - - -  Alkaline Phos 38 - 126 U/L - - -  AST 15 - 41 U/L - - -  ALT 0 - 44 U/L - - -   CBC Latest Ref Rng & Units 06/15/2020 06/14/2020 06/13/2020  WBC 4.0 - 10.5 K/uL 6.5 8.5 10.2  Hemoglobin 12.0 - 15.0 g/dL 12.4 11.9(L) 14.7  Hematocrit 36.0 - 46.0 % 38.8 36.6 45.1  Platelets 150 - 400 K/uL 235 237 282    RADS: n/a Assessment:   Abdominal pain without obvious etiology.  She is currently tolerating a diet and her pain remains somewhat inconsistent.  No surgical issues identified.  General surgery will sign off.

## 2020-06-17 NOTE — Progress Notes (Signed)
Patient ID: [name], female   DOB: May 01, 1965, 55 y.o.   MRN: [id number] [facility] PROGRESS NOTE  [name] YCX:[id number] DOB: January 10, 1966 DOA: 06/13/2020 PCP: Delsa Grana, [name]  HPI/Subjective: Patient had bowel movements last night.  Advance diet this morning.  Ate a hamburger for lunch and feeling a lot of gas.  Her blood pressure was high just now.  Patient feels better than when she came in but just having some gas and elevated blood pressure right now.  Objective: Vitals:   06/17/20 0833 06/17/20 1204  BP:  (!) 181/104  Pulse: 67 77  Resp: 15 18  Temp:  98.7 F (37.1 C)  SpO2: 98% 99%    Intake/Output Summary (Last 24 hours) at 06/17/2020 1336 Last data filed at 06/17/2020 0935 Gross per 24 hour  Intake 1595.3 ml  Output 400 ml  Net 1195.3 ml   Filed Weights   06/13/20 0822 06/15/20 0551 06/17/20 0414  Weight: 113.9 kg 115.9 kg 118.4 kg    ROS: Review of Systems  Respiratory: Negative for shortness of breath.   Cardiovascular: Negative for chest pain.  Gastrointestinal: Positive for abdominal pain. Negative for nausea and vomiting.   Exam: Physical Exam HENT:     Head: Normocephalic.     Mouth/Throat:     Pharynx: No oropharyngeal exudate.  Eyes:     General: Lids are normal.     Conjunctiva/sclera: Conjunctivae normal.  Cardiovascular:     Rate and Rhythm: Normal rate and regular rhythm.     Heart sounds: Normal heart sounds, S1 normal and S2 normal.  Pulmonary:     Breath sounds: Normal breath sounds. No decreased breath sounds, wheezing, rhonchi or rales.  Abdominal:     Palpations: Abdomen is soft.     Tenderness: There is no abdominal tenderness.  Musculoskeletal:     Right ankle: Swelling present.     Left ankle: Swelling present.  Skin:    General: Skin is warm.     Findings: No rash.  Neurological:     Mental Status: She is alert and oriented to person, place, and time.       Data Reviewed: Basic Metabolic  Panel: Recent Labs  Lab 06/13/20 0819 06/14/20 0738 06/15/20 0417 06/16/20 0634 06/17/20 0553  NA 139 138 138 139 140  K 3.9 3.8 3.8 3.9 3.9  CL 102 108 108 111 109  CO2 24 22 22 23 25   GLUCOSE 150* 147* 171* 135* 140*  BUN 16 10 13 11 9   CREATININE 0.87 0.86 0.88 0.83 0.83  CALCIUM 10.0 8.2* 8.6* 8.4* 8.6*  MG  --  1.9  --   --   --   PHOS  --  3.0  --   --   --    Liver Function Tests: Recent Labs  Lab 06/13/20 0819  AST 22  ALT 24  ALKPHOS 77  BILITOT 0.6  PROT 8.7*  ALBUMIN 4.3   Recent Labs  Lab 06/13/20 0819  LIPASE 46   CBC: Recent Labs  Lab 06/13/20 0819 06/14/20 0738 06/15/20 0417  WBC 10.2 8.5 6.5  HGB 14.7 11.9* 12.4  HCT 45.1 36.6 38.8  MCV 87.9 88.4 89.6  PLT 282 237 235    CBG: Recent Labs  Lab 06/16/20 1148 06/16/20 1712 06/16/20 2054 06/17/20 0729 06/17/20 1159  GLUCAP 145* 108* 167* 120* 117*    Recent Results (from the past 240 hour(s))  Urine Culture     Status: Abnormal  Collection Time: 06/13/20  8:19 AM   Specimen: Urine, Clean Catch  Result Value Ref Range Status   Specimen Description   Final    URINE, CLEAN CATCH Performed at [facility], [address] [place], [place] [address]    Special Requests   Final    NONE Performed at [facility], [address], [place] [address]    Culture >=100,000 COLONIES/mL ESCHERICHIA COLI (A)  Final   Report Status 06/15/2020 FINAL  Final   Organism ID, Bacteria ESCHERICHIA COLI (A)  Final      Susceptibility   Escherichia coli - MIC*    AMPICILLIN >=32 RESISTANT Resistant     CEFAZOLIN 16 SENSITIVE Sensitive     CEFEPIME <=0.12 SENSITIVE Sensitive     CEFTRIAXONE <=0.25 SENSITIVE Sensitive     CIPROFLOXACIN 1 SENSITIVE Sensitive     GENTAMICIN <=1 SENSITIVE Sensitive     IMIPENEM <=0.25 SENSITIVE Sensitive     NITROFURANTOIN <=16 SENSITIVE Sensitive     TRIMETH/SULFA <=20 SENSITIVE Sensitive     AMPICILLIN/SULBACTAM >=32 RESISTANT  Resistant     PIP/TAZO <=4 SENSITIVE Sensitive     * >=100,000 COLONIES/mL ESCHERICHIA COLI  Resp Panel by RT-PCR (Flu A&B, Covid) Nasopharyngeal Swab     Status: None   Collection Time: 06/13/20  1:08 PM   Specimen: Nasopharyngeal Swab; Nasopharyngeal(NP) swabs in vial transport medium  Result Value Ref Range Status   SARS Coronavirus 2 by RT PCR NEGATIVE NEGATIVE Final    Comment: (NOTE) SARS-CoV-2 target nucleic acids are NOT DETECTED.  The SARS-CoV-2 RNA is generally detectable in upper respiratory specimens during the acute phase of infection. The lowest concentration of SARS-CoV-2 viral copies this assay can detect is 138 copies/mL. A negative result does not preclude SARS-Cov-2 infection and should not be used as the sole basis for treatment or other patient management decisions. A negative result may occur with  improper specimen collection/handling, submission of specimen other than nasopharyngeal swab, presence of viral mutation(s) within the areas targeted by this assay, and inadequate number of viral copies(<138 copies/mL). A negative result must be combined with clinical observations, patient history, and epidemiological information. The expected result is Negative.  Fact Sheet for Patients:  EntrepreneurPulse.com.au  Fact Sheet for Healthcare Providers:  IncredibleEmployment.be  This test is no t yet approved or cleared by the [place] FDA and  has been authorized for detection and/or diagnosis of SARS-CoV-2 by FDA under an Emergency Use Authorization (EUA). This EUA will remain  in effect (meaning this test can be used) for the duration of the COVID-19 declaration under Section 564(b)(1) of the Act, 21 U.S.C.section 360bbb-3(b)(1), unless the authorization is terminated  or revoked sooner.       Influenza A by PCR NEGATIVE NEGATIVE Final   Influenza B by PCR NEGATIVE NEGATIVE Final    Comment: (NOTE) The Xpert Xpress  SARS-CoV-2/FLU/RSV plus assay is intended as an aid in the diagnosis of influenza from Nasopharyngeal swab specimens and should not be used as a sole basis for treatment. Nasal washings and aspirates are unacceptable for Xpert Xpress SARS-CoV-2/FLU/RSV testing.  Fact Sheet for Patients: EntrepreneurPulse.com.au  Fact Sheet for Healthcare Providers: IncredibleEmployment.be  This test is not yet approved or cleared by the [place] FDA and has been authorized for detection and/or diagnosis of SARS-CoV-2 by FDA under an Emergency Use Authorization (EUA). This EUA will remain in effect (meaning this test can be used) for the duration of the COVID-19 declaration under  Section 564(b)(1) of the Act, 21 U.S.C. section 360bbb-3(b)(1), unless the authorization is terminated or revoked.  Performed at [facility], [address] [place], [place] [address]      Studies: CT HEAD WO CONTRAST  Result Date: 06/15/2020 CLINICAL DATA:  Increased right arm weakness. EXAM: CT HEAD WITHOUT CONTRAST TECHNIQUE: Contiguous axial images were obtained from the base of the skull through the vertex without intravenous contrast. COMPARISON:  02/04/2018 FINDINGS: Brain: No evidence of acute infarction, hemorrhage, extra-axial collection, ventriculomegaly, or mass effect. Generalized cerebral atrophy. Periventricular white matter low attenuation likely secondary to microangiopathy. Vascular: No hyperdense vessel. No significant intracranial atherosclerotic disease. Skull: Negative for fracture or focal lesion. Right frontal burr hole. Sinuses/Orbits: Visualized portions of the orbits are unremarkable. Visualized portions of the paranasal sinuses are unremarkable. Visualized portions of the mastoid air cells are unremarkable. Other: None. IMPRESSION: No acute intracranial pathology. Electronically Signed   By: Kathreen Devoid   On: 06/15/2020 14:59   MR BRAIN WO  CONTRAST  Result Date: 06/16/2020 CLINICAL DATA:  Concern for stroke.  Right hand weakness. EXAM: MRI HEAD WITHOUT CONTRAST TECHNIQUE: Multiplanar, multiecho pulse sequences of the brain and surrounding structures were obtained without intravenous contrast. COMPARISON:  November 28, 2018. FINDINGS: Brain: No acute infarction, hemorrhage, hydrocephalus, extra-axial collection or mass lesion. Mild generalized atrophy. Vascular: Major arterial flow voids are maintained at the skull base. Skull and upper cervical spine: Normal marrow signal. Sinuses/Orbits: Clear sinuses. Bilateral proptosis with prominent retro bulbar fat. Otherwise, unremarkable orbits. Other: No sizable mastoid effusion. IMPRESSION: No evidence of acute intracranial abnormality. Specifically, no acute infarct. Electronically Signed   By: Margaretha Sheffield MD   On: 06/16/2020 17:48    Scheduled Meds: . baclofen  10 mg Oral TID  . budesonide  0.5 mg Inhalation Daily  . carvedilol  12.5 mg Oral BID WC  . clopidogrel  75 mg Oral Daily  . DULoxetine  20 mg Oral QHS  . DULoxetine  40 mg Oral q morning  . enoxaparin (LOVENOX) injection  0.5 mg/kg Subcutaneous Q24H  . gabapentin  1,200 mg Oral QHS  . gabapentin  600 mg Oral BID  . hydrochlorothiazide  12.5 mg Oral Daily  . insulin aspart  0-15 Units Subcutaneous TID WC  . insulin aspart  0-5 Units Subcutaneous QHS  . insulin glargine  30 Units Subcutaneous QHS  . ipratropium-albuterol  3 mL Nebulization Daily  . levETIRAcetam  500 mg Oral q morning  . levETIRAcetam  750 mg Oral QHS  . losartan  100 mg Oral Daily  . mometasone-formoterol  2 puff Inhalation BID  . pantoprazole (PROTONIX) IV  40 mg Intravenous Q12H  . simethicone  80 mg Oral QID  . simvastatin  20 mg Oral q1800  . spironolactone  25 mg Oral Daily   Continuous Infusions: . cefTRIAXone (ROCEPHIN)  IV Stopped (06/16/20 1421)    Assessment/Plan:  1. Partial small bowel obstruction.  Patient still has a little  abdominal pain especially after advance diet today.  Patient had bowel movements overnight. 2. Essential hypertension with elevated blood pressure right now. On Coreg, spironolactone and losartan.  Will add low-dose hydrochlorothiazide. 3. Right-sided weakness.  MRI of the brain negative for acute stroke.  Back on Plavix.  Continue to watch. 4. Acute cystitis with hematuria.  Urine culture growing out E. coli.  Today will be day 5 of Rocephin and antibiotics will be discontinued after this dose. 5. Type 2 diabetes mellitus with hyperlipidemia.  Continue  low-dose Lantus and sliding scale.  She uses insulin pump at home. 6. Obesity with a BMI of 43.43, obstructive sleep apnea on CPAP at night 7. COPD continue inhalers and nebulizers 8. Weakness and falls.  Appreciate physical therapy consultation 9. Nonobstructing kidney stones.  Will need urology follow-up as outpatient 10. 2.7 cm adrenal adenoma.  We will also need outpatient follow-up for this.  We will send off renin and aldosterone and 24-hour urine for metanephrines      Code Status:     Code Status Orders  (From admission, onward)         Start     Ordered   06/13/20 1412  Full code  Continuous        06/13/20 1414        Code Status History    Date Active Date Inactive Code Status Order ID Comments User Context   05/16/2019 1907 05/21/2019 1801 Full Code [id number]  Athena Masse, MD ED   04/27/2019 1408 04/29/2019 2021 Full Code [id number]  Ivor Costa, MD ED   02/22/2014 2044 03/01/2014 2044 Full Code [id number]  Berle Mull, MD Inpatient   09/24/2011 1356 09/25/2011 2010 Full Code [id number]  Herrschaft, Gaye Alken, RN Inpatient   08/22/2011 0649 08/26/2011 2128 Full Code [id number]  Donzetta Kohut, RN Inpatient   05/20/2011 1920 05/21/2011 1045 Full Code [id number]  Chauncy Passy, MD ED   Advance Care Planning Activity    Advance Directive Documentation   Flowsheet Row Most Recent Value  Type of Advance Directive Healthcare Power of  Attorney  Pre-existing out of facility DNR order (yellow form or pink MOST form) --  "MOST" Form in Place? --     Family Communication: Spoke with patient's daughter on the phone Disposition Plan: Status is: Inpatient  Dispo: The patient is from: Home              Anticipated d/c is to: home hopefully 06/18/2020              Patient currently just advance diet to solid food today and feeling uncomfortable after lunch.  Watch another day on solid food.   Difficult to place patient.  No.  Time spent: 28 minutes  [place]

## 2020-06-17 NOTE — Care Management Important Message (Signed)
Important Message  Patient Details  Name: [name] MRN: [id number] Date of Birth: 09/30/1965   Medicare Important Message Given:  Yes     Juliann Pulse A Landy Dunnavant 06/17/2020, 2:44 PM

## 2020-06-18 ENCOUNTER — Ambulatory Visit: Payer: Medicare Other | Admitting: Urology

## 2020-06-18 DIAGNOSIS — I10 Essential (primary) hypertension: Secondary | ICD-10-CM | POA: Diagnosis not present

## 2020-06-18 DIAGNOSIS — R29898 Other symptoms and signs involving the musculoskeletal system: Secondary | ICD-10-CM

## 2020-06-18 DIAGNOSIS — K56609 Unspecified intestinal obstruction, unspecified as to partial versus complete obstruction: Secondary | ICD-10-CM | POA: Diagnosis not present

## 2020-06-18 DIAGNOSIS — N3001 Acute cystitis with hematuria: Secondary | ICD-10-CM | POA: Diagnosis not present

## 2020-06-18 LAB — GLUCOSE, CAPILLARY
Glucose-Capillary: 161 mg/dL — ABNORMAL HIGH (ref 70–99)
Glucose-Capillary: <10 mg/dL — CL (ref 70–99)
Glucose-Capillary: <10 mg/dL — CL (ref 70–99)
Glucose-Capillary: 152 mg/dL — ABNORMAL HIGH (ref 70–99)
Glucose-Capillary: 144 mg/dL — ABNORMAL HIGH (ref 70–99)
Glucose-Capillary: 146 mg/dL — ABNORMAL HIGH (ref 70–99)
Glucose-Capillary: 225 mg/dL — ABNORMAL HIGH (ref 70–99)

## 2020-06-18 LAB — BASIC METABOLIC PANEL
Anion gap: 9 (ref 5–15)
BUN: 15 mg/dL (ref 6–20)
CO2: 25 mmol/L (ref 22–32)
Calcium: 8.9 mg/dL (ref 8.9–10.3)
Chloride: 104 mmol/L (ref 98–111)
Creatinine, Ser: 0.89 mg/dL (ref 0.44–1.00)
GFR, Estimated: >60 mL/min (ref >60)
Glucose, Bld: 182 mg/dL — ABNORMAL HIGH (ref 70–99)
Potassium: 3.8 mmol/L (ref 3.5–5.1)
Sodium: 138 mmol/L (ref 135–145)

## 2020-06-18 MED ORDER — HYDRALAZINE HCL 25 MG PO TABS
25.0000 mg | ORAL_TABLET | Freq: Three times a day (TID) | ORAL | Status: DC
  Administered 2020-06-18 – 2020-06-20 (×4): 25 mg via ORAL
  Filled 2020-06-18 (×3): qty 1

## 2020-06-18 MED ORDER — AMLODIPINE BESYLATE 5 MG PO TABS
5.0000 mg | ORAL_TABLET | Freq: Every day | ORAL | Status: DC
  Administered 2020-06-18: 5 mg via ORAL
  Filled 2020-06-18: qty 1

## 2020-06-18 MED ORDER — HYDROCHLOROTHIAZIDE 25 MG PO TABS
25.0000 mg | ORAL_TABLET | Freq: Every day | ORAL | Status: DC
  Administered 2020-06-18 – 2020-06-20 (×4): 25 mg via ORAL
  Filled 2020-06-18 (×4): qty 1

## 2020-06-18 MED ORDER — FUROSEMIDE 10 MG/ML IJ SOLN
40.0000 mg | Freq: Once | INTRAMUSCULAR | Status: AC
  Administered 2020-06-18: 40 mg via INTRAVENOUS
  Filled 2020-06-18: qty 4

## 2020-06-18 MED ORDER — AMLODIPINE BESYLATE 5 MG PO TABS
5.0000 mg | ORAL_TABLET | Freq: Once | ORAL | Status: AC
  Administered 2020-06-18: 5 mg via ORAL
  Filled 2020-06-18: qty 1

## 2020-06-18 MED ORDER — AMLODIPINE BESYLATE 10 MG PO TABS
10.0000 mg | ORAL_TABLET | Freq: Every day | ORAL | Status: DC
  Administered 2020-06-19 – 2020-06-20 (×2): 10 mg via ORAL
  Filled 2020-06-18 (×2): qty 1

## 2020-06-18 NOTE — Progress Notes (Signed)
Occupational Therapy Treatment Patient Details Name: [name] MRN: [id number] DOB: [birth year] Today's Date: 06/18/2020    History of present illness Camreigh Michie is 29yoF [facility] c N?V, ABD pain. CT renal study showing partial SBO likely ileus per MD note. PMH: COPD, DM, HTN, GAD, recent UTI, OSA on CPAP.   OT comments  Ms. Bazzi did well in OT today, was eager to participate. Mod I with bed mobility, ambulation with QC. Pt also able to engage in grooming in standing, toileting, LB dressing, all without any physical assistance from therapist. Reports her abdominal pain reduced today. Pt's BP 176/101 and she reports slight dizziness with standing but no LOB. Continue OT while Ms. Olds is hospitalized, followed by [facility] post DC, to address falls prevention, balance, safety at home, including recommendations for safety improvements while bathing.     Follow Up Recommendations  Home health OT    Equipment Recommendations  Tub/shower bench;Other (comment) (new QC -- current one is loose/shaky, less stable than ideal)    Recommendations for Other Services      Precautions / Restrictions Precautions Precautions: Fall Restrictions Weight Bearing Restrictions: No       Mobility Bed Mobility Overal bed mobility: Modified Independent Bed Mobility: Supine to Sit     Supine to sit: Modified independent (Device/Increase time)     General bed mobility comments: increased time/effort, no physical assist required    Transfers Overall transfer level: Needs assistance Equipment used: Quad cane Transfers: Sit to/from [facility] Sit to Stand: Supervision Stand pivot transfers: Supervision       General transfer comment: SUPV, pt reports slight dizziness in standing    Balance Overall balance assessment: Needs assistance;History of Falls   Sitting balance-Leahy Scale: Good Sitting balance - Comments: good sitting balance while reaching  beyond BOS     Standing balance-Leahy Scale: Good Standing balance comment: using quad cane in R hand                           ADL either performed or assessed with clinical judgement   ADL Overall ADL's : Needs assistance/impaired Eating/Feeding: Independent   Grooming: Wash/dry face;Oral care;Standing;Modified independent   Upper Body Bathing: Modified independent;Sitting           Lower Body Dressing: Modified independent;Sitting/lateral leans Lower Body Dressing Details (indicate cue type and reason): donning socks Toilet Transfer: Modified Independent;Comfort height toilet   Toileting- Clothing Manipulation and Hygiene: Modified independent         General ADL Comments: Mod I for UB ADLs, toileting     Vision Baseline Vision/History: Legally blind Patient Visual Report: No change from baseline     Perception     Praxis      Cognition Arousal/Alertness: Awake/alert Behavior During Therapy: WFL for tasks assessed/performed Overall Cognitive Status: Within Functional Limits for tasks assessed                                          Exercises Other Exercises Other Exercises: bed mobility, transfers, toileting, grooming, UB bathing, LB dressing, standing balance tolerance   Shoulder Instructions       General Comments      Pertinent Vitals/ Pain       Pain Score: 2  Pain Location: RLQ Pain Intervention(s): Limited activity within patient's tolerance;Monitored during session;Repositioned  Home Living                                          Prior Functioning/Environment              Frequency  Min 1X/week        Progress Toward Goals  OT Goals(current goals can now be found in the care plan section)  Progress towards OT goals: Progressing toward goals  Acute Rehab OT Goals Patient Stated Goal: regain strength, stop falling at home so much OT Goal Formulation: With patient Time For Goal  Achievement: 06/28/20 Potential to Achieve Goals: Good  Plan Discharge plan remains appropriate;Frequency remains appropriate    Co-evaluation                 AM-PAC OT "6 Clicks" Daily Activity     Outcome Measure   Help from another person eating meals?: None Help from another person taking care of personal grooming?: None Help from another person toileting, which includes using toliet, bedpan, or urinal?: None Help from another person bathing (including washing, rinsing, drying)?: A Little Help from another person to put on and taking off regular upper body clothing?: A Little Help from another person to put on and taking off regular lower body clothing?: A Little 6 Click Score: 21    End of Session Equipment Utilized During Treatment: Other (comment) (quad cane)  OT Visit Diagnosis: Unsteadiness on feet (R26.81);Muscle weakness (generalized) (M62.81)   Activity Tolerance Patient tolerated treatment well   Patient Left in chair;with call bell/phone within reach   Nurse Communication Mobility status        Time: 9432-7614 OT Time Calculation (min): 33 min  Charges: OT General Charges $OT Visit: 1 Visit OT Treatments $Self Care/Home Management : 23-37 mins  Josiah Lobo, PhD, MS, OTR/L 06/18/20, 1:58 PM

## 2020-06-18 NOTE — Progress Notes (Signed)
Patient ID: [name], female   DOB: 1965/09/19, 55 y.o.   MRN: [id number] [facility] PROGRESS NOTE  [name] YIA:[id number] DOB: 1965-08-01 DOA: 06/13/2020 PCP: Delsa Grana, [name]  HPI/Subjective: Patient feeling better.  Blood pressure the last 2 days has been very high.  I asked nursing staff to check her blood pressure with a large cuff.  They were still able to get a large cuff but blood pressure still high.  Initially came in with bowel obstruction.  Now on a solid food diet and having bowel movements.  Objective: Vitals:   06/18/20 1453 06/18/20 1642  BP: (!) 162/108 (!) 185/100  Pulse:  81  Resp:  18  Temp:  98.5 F (36.9 C)  SpO2:  98%    Intake/Output Summary (Last 24 hours) at 06/18/2020 1744 Last data filed at 06/18/2020 1506 Gross per 24 hour  Intake 661 ml  Output 1400 ml  Net -739 ml   Filed Weights   06/15/20 0551 06/17/20 0414 06/18/20 0500  Weight: 115.9 kg 118.4 kg 117.7 kg    ROS: Review of Systems  Respiratory: Negative for shortness of breath.   Cardiovascular: Negative for chest pain.  Gastrointestinal: Positive for nausea. Negative for abdominal pain and vomiting.   Exam: Physical Exam HENT:     Head: Normocephalic.     Mouth/Throat:     Pharynx: No oropharyngeal exudate.  Eyes:     General: Lids are normal.     Conjunctiva/sclera: Conjunctivae normal.     Pupils: Pupils are equal, round, and reactive to light.  Cardiovascular:     Rate and Rhythm: Normal rate and regular rhythm.     Heart sounds: Normal heart sounds, S1 normal and S2 normal.  Pulmonary:     Breath sounds: Normal breath sounds. No decreased breath sounds, wheezing, rhonchi or rales.  Abdominal:     Palpations: Abdomen is soft.     Tenderness: There is no abdominal tenderness.  Musculoskeletal:     Right ankle: Swelling present.     Left ankle: Swelling present.  Skin:    General: Skin is warm.     Findings: No rash.  Neurological:      Mental Status: She is alert and oriented to person, place, and time.       Data Reviewed: Basic Metabolic Panel: Recent Labs  Lab 06/14/20 0738 06/15/20 0417 06/16/20 0634 06/17/20 0553 06/18/20 0853  NA 138 138 139 140 138  K 3.8 3.8 3.9 3.9 3.8  CL 108 108 111 109 104  CO2 22 22 23 25 25   GLUCOSE 147* 171* 135* 140* 182*  BUN 10 13 11 9 15   CREATININE 0.86 0.88 0.83 0.83 0.89  CALCIUM 8.2* 8.6* 8.4* 8.6* 8.9  MG 1.9  --   --   --   --   PHOS 3.0  --   --   --   --    Liver Function Tests: Recent Labs  Lab 06/13/20 0819  AST 22  ALT 24  ALKPHOS 77  BILITOT 0.6  PROT 8.7*  ALBUMIN 4.3   Recent Labs  Lab 06/13/20 0819  LIPASE 46  CBC: Recent Labs  Lab 06/13/20 0819 06/14/20 0738 06/15/20 0417  WBC 10.2 8.5 6.5  HGB 14.7 11.9* 12.4  HCT 45.1 36.6 38.8  MCV 87.9 88.4 89.6  PLT 282 237 235    CBG: Recent Labs  Lab 06/17/20 1558 06/17/20 2056 06/18/20 0745 06/18/20 1127 06/18/20 1641  GLUCAP 154* 162*  152* 144* 146*    Recent Results (from the past 240 hour(s))  Urine Culture     Status: Abnormal   Collection Time: 06/13/20  8:19 AM   Specimen: Urine, Clean Catch  Result Value Ref Range Status   Specimen Description   Final    URINE, CLEAN CATCH Performed at [facility], [address] [place], [place] [address]    Special Requests   Final    NONE Performed at [facility], [place], [place] [address]    Culture >=100,000 COLONIES/mL ESCHERICHIA COLI (A)  Final   Report Status 06/15/2020 FINAL  Final   Organism ID, Bacteria ESCHERICHIA COLI (A)  Final      Susceptibility   Escherichia coli - MIC*    AMPICILLIN >=32 RESISTANT Resistant     CEFAZOLIN 16 SENSITIVE Sensitive     CEFEPIME <=0.12 SENSITIVE Sensitive     CEFTRIAXONE <=0.25 SENSITIVE Sensitive     CIPROFLOXACIN 1 SENSITIVE Sensitive     GENTAMICIN <=1 SENSITIVE Sensitive     IMIPENEM <=0.25 SENSITIVE Sensitive     NITROFURANTOIN <=16  SENSITIVE Sensitive     TRIMETH/SULFA <=20 SENSITIVE Sensitive     AMPICILLIN/SULBACTAM >=32 RESISTANT Resistant     PIP/TAZO <=4 SENSITIVE Sensitive     * >=100,000 COLONIES/mL ESCHERICHIA COLI  Resp Panel by RT-PCR (Flu A&B, Covid) Nasopharyngeal Swab     Status: None   Collection Time: 06/13/20  1:08 PM   Specimen: Nasopharyngeal Swab; Nasopharyngeal(NP) swabs in vial transport medium  Result Value Ref Range Status   SARS Coronavirus 2 by RT PCR NEGATIVE NEGATIVE Final    Comment: (NOTE) SARS-CoV-2 target nucleic acids are NOT DETECTED.  The SARS-CoV-2 RNA is generally detectable in upper respiratory specimens during the acute phase of infection. The lowest concentration of SARS-CoV-2 viral copies this assay can detect is 138 copies/mL. A negative result does not preclude SARS-Cov-2 infection and should not be used as the sole basis for treatment or other patient management decisions. A negative result may occur with  improper specimen collection/handling, submission of specimen other than nasopharyngeal swab, presence of viral mutation(s) within the areas targeted by this assay, and inadequate number of viral copies(<138 copies/mL). A negative result must be combined with clinical observations, patient history, and epidemiological information. The expected result is Negative.  Fact Sheet for Patients:  EntrepreneurPulse.com.au  Fact Sheet for Healthcare Providers:  IncredibleEmployment.be  This test is no t yet approved or cleared by the [place] FDA and  has been authorized for detection and/or diagnosis of SARS-CoV-2 by FDA under an Emergency Use Authorization (EUA). This EUA will remain  in effect (meaning this test can be used) for the duration of the COVID-19 declaration under Section 564(b)(1) of the Act, 21 U.S.C.section 360bbb-3(b)(1), unless the authorization is terminated  or revoked sooner.       Influenza A by PCR  NEGATIVE NEGATIVE Final   Influenza B by PCR NEGATIVE NEGATIVE Final    Comment: (NOTE) The Xpert Xpress SARS-CoV-2/FLU/RSV plus assay is intended as an aid in the diagnosis of influenza from Nasopharyngeal swab specimens and should not be used as a sole basis for treatment. Nasal washings and aspirates are unacceptable for Xpert Xpress SARS-CoV-2/FLU/RSV testing.  Fact Sheet for Patients: EntrepreneurPulse.com.au  Fact Sheet for Healthcare Providers: IncredibleEmployment.be  This test is not yet approved or cleared by the [place] FDA and has been authorized for detection and/or diagnosis of SARS-CoV-2 by FDA under an  Emergency Use Authorization (EUA). This EUA will remain in effect (meaning this test can be used) for the duration of the COVID-19 declaration under Section 564(b)(1) of the Act, 21 U.S.C. section 360bbb-3(b)(1), unless the authorization is terminated or revoked.  Performed at [facility], [address] [place],  [address]       Scheduled Meds: . [START ON 06/19/2020] amLODipine  10 mg Oral Daily  . baclofen  10 mg Oral TID  . budesonide  0.5 mg Inhalation Daily  . carvedilol  12.5 mg Oral BID WC  . clopidogrel  75 mg Oral Daily  . DULoxetine  20 mg Oral QHS  . DULoxetine  40 mg Oral q morning  . enoxaparin (LOVENOX) injection  0.5 mg/kg Subcutaneous Q24H  . gabapentin  1,200 mg Oral QHS  . gabapentin  600 mg Oral BID  . hydrALAZINE  25 mg Oral Q8H  . hydrochlorothiazide  25 mg Oral Daily  . insulin aspart  0-15 Units Subcutaneous TID WC  . insulin aspart  0-5 Units Subcutaneous QHS  . insulin glargine  30 Units Subcutaneous QHS  . ipratropium-albuterol  3 mL Nebulization Daily  . levETIRAcetam  500 mg Oral q morning  . levETIRAcetam  750 mg Oral QHS  . losartan  100 mg Oral Daily  . mometasone-formoterol  2 puff Inhalation BID  . pantoprazole (PROTONIX) IV  40 mg Intravenous Q12H  .  simethicone  80 mg Oral QID  . simvastatin  20 mg Oral q1800  . spironolactone  25 mg Oral Daily   Brief history.  55 year old female with history of COPD, diabetes, hypertension anxiety and recent urinary tract infection presented with nausea vomiting abdominal pain and found to have a partial small bowel obstruction and urinary tract infection.  Patient was given IV antibiotics and completed a course for UTI.  Patient was seen by general surgery and now patient on a diet and having bowel movements.  Blood pressure last 2 days has been very high and I have added hydrochlorothiazide yesterday and Norvasc and hydralazine today.  Assessment/Plan:  1. Accelerated hypertension last 2 days.  I have added Norvasc 10 mg hydrochlorothiazide 25 mg and hydralazine.  Patient already on spironolactone, Coreg and losartan.  Last blood pressure 185/100.  We will continue to watch here in the hospital until blood pressure trends a little bit better.  Asked nursing staff to get a large cuff and take on the arm and blood pressure still elevated. 2. Partial small bowel obstruction.  On advance diet.  Having bowel movements.  This has resolved and surgery signed off. 3. Right-sided weakness during the hospital course.  MRI of the brain was negative for acute stroke.  Patient is on Plavix.  Seen urgently by neurology the other day who thought this was more functional in nature. 4. Acute cystitis with hematuria.  Urine culture growing out E. coli.  Finished antibiotics 5 days of Rocephin during the hospital course. 5. Type 2 diabetes mellitus with hyperlipidemia continue low-dose Lantus and sliding scale.  The patient uses insulin pump at home. 6. Obesity with a BMI of 43.18.  Obstructive sleep apnea on CPAP at night 7. COPD continue usual inhalers and nebulizers 8. Weakness and falls.  Continue physical therapy evaluation.  Home health recommended 9. Nonobstructing kidney stones, will need urology follow-up as  outpatient 10. 2.7 cm adrenal adenoma.  Will need outpatient follow-up for this.  Renin and aldosterone level sent this morning.  24-hour urine for metanephrines  will finish up today.        Code Status:     Code Status Orders  (From admission, onward)         Start     Ordered   06/13/20 1412  Full code  Continuous        06/13/20 1414        Code Status History    Date Active Date Inactive Code Status Order ID Comments User Context   05/16/2019 1907 05/21/2019 1801 Full Code [id number]  Athena Masse, MD ED   04/27/2019 1408 04/29/2019 2021 Full Code [id number]  Ivor Costa, MD ED   02/22/2014 2044 03/01/2014 2044 Full Code [id number]  Berle Mull, MD Inpatient   09/24/2011 1356 09/25/2011 2010 Full Code [id number]  Herrschaft, Gaye Alken, RN Inpatient   08/22/2011 0649 08/26/2011 2128 Full Code [id number]  Donzetta Kohut, RN Inpatient   05/20/2011 1920 05/21/2011 1045 Full Code [id number]  Chauncy Passy, MD ED   Advance Care Planning Activity    Advance Directive Documentation   Flowsheet Row Most Recent Value  Type of Advance Directive Healthcare Power of Attorney  Pre-existing out of facility DNR order (yellow form or pink MOST form) --  "MOST" Form in Place? --     Family Communication: Spoke with daughter on the phone Disposition Plan: Status is: Inpatient  Dispo: The patient is from: Home              Anticipated d/c is to: Home once blood pressure comes down a little bit.              Patient currently improved with respect to bowel obstruction but now blood pressure is very elevated   Difficult to place patient.  No.  Time spent: 27 minutes.  [place]  [facility] [employer]

## 2020-06-18 NOTE — Progress Notes (Addendum)
PT Cancellation Note  Patient Details Name: [name] MRN: [id number] DOB: 19-Dec-1965   Cancelled Treatment:    Reason Eval/Treat Not Completed: Medical issues which prohibited therapy (BP elevated outside safe range for exercise per facility protocol. Will continue to follow, resuem treatment once medically ready.) (184/182mHg)    9:54 AM, 06/18/20 AEtta Grandchild PT, DPT Physical Therapist - [facility] [phone]([place]   [place] 06/18/2020, 9:54 AM

## 2020-06-18 NOTE — Progress Notes (Signed)
PT Cancellation Note  Patient Details Name: [name] MRN: [id number] DOB: 07-Dec-1965   Cancelled Treatment:    Reason Eval/Treat Not Completed: Patient not medically ready. Patient continues to have high BP this pm. (176/101). Will continue to monitor and see patient as appropriate.    Reason Helzer 06/18/2020, 1:19 PM

## 2020-06-18 NOTE — TOC Progression Note (Addendum)
Transition of Care [facility]) - Progression Note    Patient Details  Name: Synia Douglass MRN: [id number] Date of Birth: May 29, 1965  [facility] CM/SW Contact  Beverly Sessions, RN Phone Number: 06/18/2020, 3:35 PM  Clinical Narrative:     [facility], [facility], and [facility] can accept patient for PT and OT.   [facility] can provide start of care on Thursday depending on dc day.  Options presented to patient, she would like to use [facility]. Referral made to [place] with Darrick Grinder with [employer] has delivered rollator to room   Expected Discharge Plan: [place] Barriers to Discharge: Continued Medical Work up  Expected Discharge Plan and Services Expected Discharge Plan: [place] Choice: [place] arrangements for the past 2 months: Single Family Home                                       Social Determinants of Health (SDOH) Interventions    Readmission Risk Interventions No flowsheet data found.

## 2020-06-19 DIAGNOSIS — R112 Nausea with vomiting, unspecified: Secondary | ICD-10-CM | POA: Diagnosis not present

## 2020-06-19 LAB — GLUCOSE, CAPILLARY
Glucose-Capillary: 196 mg/dL — ABNORMAL HIGH (ref 70–99)
Glucose-Capillary: 198 mg/dL — ABNORMAL HIGH (ref 70–99)
Glucose-Capillary: 215 mg/dL — ABNORMAL HIGH (ref 70–99)
Glucose-Capillary: 265 mg/dL — ABNORMAL HIGH (ref 70–99)

## 2020-06-19 NOTE — Progress Notes (Signed)
Physical Therapy Treatment Patient Details Name: [name] MRN: [id number] DOB: 1966-01-03 Today's Date: 06/19/2020    History of Present Illness [name] is 62yoF [facility] c N?V, ABD pain. CT renal study showing partial SBO likely ileus per MD note. PMH: COPD, DM, HTN, GAD, recent UTI, OSA on CPAP.    PT Comments    Pt in bed upon entry, agreeable to session. Pt progressing AMB this date, also able to perform stairs training to show capacity to safely enter the home at DC. 4WW received and safe use noted without any needed cues. Will continue to follow.    Follow Up Recommendations  Home health PT;Supervision - Intermittent;Supervision for mobility/OOB     Equipment Recommendations   (Rollator (already received))    Recommendations for Other Services       Precautions / Restrictions Precautions Precautions: Fall Restrictions Weight Bearing Restrictions: No    Mobility  Bed Mobility Overal bed mobility: Modified Independent Bed Mobility: Supine to Sit     Supine to sit: Modified independent (Device/Increase time);HOB elevated          Transfers Overall transfer level: Needs assistance Equipment used: None Transfers: Sit to/from Stand Sit to Stand: Supervision         General transfer comment: given her QC after standing, then her rollator for AMB out of room  Ambulation/Gait Ambulation/Gait assistance: Min guard Gait Distance (Feet): 75 Feet Assistive device: 4-wheeled walker   Gait velocity: increased   General Gait Details: AMB to stairwell, then out and over to corner hallway and back to room.   Stairs Stairs: Yes Stairs assistance: Min guard Stair Management: One rail Left;With cane Number of Stairs: 4     Wheelchair Mobility    Modified Rankin (Stroke Patients Only)       Balance Overall balance assessment: Modified Independent;Mild deficits observed, not formally tested                                           Cognition Arousal/Alertness: Awake/alert Behavior During Therapy: WFL for tasks assessed/performed Overall Cognitive Status: Within Functional Limits for tasks assessed                                        Exercises      General Comments        Pertinent Vitals/Pain Pain Assessment: No/denies pain    Home Living                      Prior Function            PT Goals (current goals can now be found in the care plan section) Acute Rehab PT Goals Patient Stated Goal: regain strength, stop falling at home so much PT Goal Formulation: With patient Time For Goal Achievement: 06/28/20 Potential to Achieve Goals: Good Progress towards PT goals: Progressing toward goals    Frequency    Min 2X/week      PT Plan Current plan remains appropriate    Co-evaluation              AM-PAC PT "6 Clicks" Mobility   Outcome Measure  Help needed turning from your back to your side while in a flat bed without using bedrails?: None Help  needed moving from lying on your back to sitting on the side of a flat bed without using bedrails?: None Help needed moving to and from a bed to a chair (including a wheelchair)?: None Help needed standing up from a chair using your arms (e.g., wheelchair or bedside chair)?: None Help needed to walk in hospital room?: A Little Help needed climbing 3-5 steps with a railing? : A Little 6 Click Score: 22    End of Session Equipment Utilized During Treatment: Gait belt Activity Tolerance: Patient tolerated treatment well;No increased pain Patient left: in bed;with call bell/phone within reach   PT Visit Diagnosis: Unsteadiness on feet (R26.81);Difficulty in walking, not elsewhere classified (R26.2);Other abnormalities of gait and mobility (R26.89);Other symptoms and signs involving the nervous system (R29.898)     Time: 4098-1191 PT Time Calculation (min) (ACUTE ONLY): 9 min  Charges:   $Gait Training: 8-22 mins                     5:46 PM, 06/19/20 Etta Grandchild, PT, DPT Physical Therapist - [facility]  [phone] ([place])    [place] C 06/19/2020, 5:44 PM

## 2020-06-19 NOTE — Progress Notes (Signed)
PROGRESS NOTE    [name]  DDU:[id number] DOB: [birth year] DOA: 06/13/2020 PCP: Delsa Grana, [name]  Brief Narrative:  55 year old female with history of COPD, diabetes, hypertension anxiety and recent urinary tract infection presented with nausea vomiting abdominal pain and found to have a partial small bowel obstruction and urinary tract infection.  Patient was given IV antibiotics and completed a course for UTI.  Patient was seen by general surgery and now patient on a diet and having bowel movements  Has been dealing with very markedly elevated blood pressure since 5/30.  Multimodal antihypertensive regimen was initiated with good result.   Assessment & Plan:   Principal Problem:   Nausea & vomiting Active Problems:   Accelerated hypertension   GERD   Class 3 severe obesity with serious comorbidity and body mass index (BMI) of 40.0 to 44.9 in adult [facility])   Type 2 diabetes mellitus with hyperlipidemia (HCC)   OSA (obstructive sleep apnea)   Diabetic polyneuropathy associated with type 2 diabetes mellitus ([place])   History of CVA (cerebrovascular accident)   COPD (chronic obstructive pulmonary disease) (HCC)   CAD (coronary artery disease)   Ileus (HCC)   SBO (small bowel obstruction) (HCC)   Acute cystitis with hematuria   Right sided weakness   Adrenal adenoma, right   Weakness of right arm  Hypertensive urgency Blood pressure elevated since 5/30 Currently better controlled on extensive multimodal antihypertensive regimen Plan: Continue current regimen of Norvasc 10 mg daily, hydrochlorothiazide 25 mg daily, hydralazine 25 mg every 8 hours, Aldactone, Coreg, losartan  Partial SBO Diet has been advanced and patient is having bowel movements.  Tolerating p.o. intake without issue.  General surgery signed off  Right-sided weakness Stat MRI negative for acute CVA.  Patient on Plavix.  We will continue  Acute cystitis associated with hematuria Urine culture with  E. coli.  Completed 5 days of antibiotics  Type 2 diabetes mellitus with hyperlipidemia On basal bolus regimen and sliding scale and carb modified diet Patient on home insulin pump  Obesity This complicates overall care  Obstructive sleep apnea Home CPAP nightly  COPD Not acutely exacerbated Continue home inhalers and nebulizer regimen  Weakness and falls Appreciate PT follow-up.  Home health recommended  Nonobstructive nephrolithiasis Outpatient urology follow-up  2.7 cm adrenal adenoma Renin/aldosterone ratio is pending.  24-hour metanephrine pending.  Will likely need outpatient follow-up should any of the studies be irregular   DVT prophylaxis: SQ Lovenox Code Status: Full Family Communication: Daughter [phone] on 6/1 Disposition Plan: Status is: Inpatient  Remains inpatient appropriate because:Inpatient level of care appropriate due to severity of illness   Dispo: The patient is from: Home              Anticipated d/c is to: Home              Patient currently is not medically stable to d/c.   Difficult to place patient No  Hypertensive urgency.  Blood pressure control improving over interval.  Possible discharge home 6/2     Level of care: Med-Surg  Consultants:   None  Procedures:  None  Antimicrobials:   None   Subjective: Patient seen and examined.  Sitting comfortably in bed.  No visible distress.  No pain complaints.  Objective: Vitals:   06/18/20 2221 06/19/20 0538 06/19/20 0828 06/19/20 1136  BP: (!) 161/91 111/81 (!) 151/86 132/87  Pulse: 63 80 79 68  Resp: 16  18 16   Temp: 98.2 F (36.8 C) (!)  97.5 F (36.4 C) 97.8 F (36.6 C) 98 F (36.7 C)  TempSrc: Oral  Oral Oral  SpO2: 97% 91% 97% 98%  Weight:      Height:        Intake/Output Summary (Last 24 hours) at 06/19/2020 1608 Last data filed at 06/19/2020 1403 Gross per 24 hour  Intake 480 ml  Output --  Net 480 ml   Filed Weights   06/15/20 0551 06/17/20 0414  06/18/20 0500  Weight: 115.9 kg 118.4 kg 117.7 kg    Examination:  General exam: Appears calm and comfortable  Respiratory system: Clear to auscultation. Respiratory effort normal. Cardiovascular system: S1 & S2 heard, RRR. No JVD, murmurs, rubs, gallops or clicks.  Trace pitting edema bilateral lower extremities Gastrointestinal system: Obese, nontender, nondistended, normal bowel sounds Central nervous system: Alert and oriented. No focal neurological deficits. Extremities: Symmetric 5 x 5 power. Skin: No rashes, lesions or ulcers Psychiatry: Judgement and insight appear normal. Mood & affect appropriate.     Data Reviewed: I have personally reviewed following labs and imaging studies  CBC: Recent Labs  Lab 06/13/20 0819 06/14/20 0738 06/15/20 0417  WBC 10.2 8.5 6.5  HGB 14.7 11.9* 12.4  HCT 45.1 36.6 38.8  MCV 87.9 88.4 89.6  PLT 282 237 678   Basic Metabolic Panel: Recent Labs  Lab 06/14/20 0738 06/15/20 0417 06/16/20 0634 06/17/20 0553 06/18/20 0853  NA 138 138 139 140 138  K 3.8 3.8 3.9 3.9 3.8  CL 108 108 111 109 104  CO2 22 22 23 25 25   GLUCOSE 147* 171* 135* 140* 182*  BUN 10 13 11 9 15   CREATININE 0.86 0.88 0.83 0.83 0.89  CALCIUM 8.2* 8.6* 8.4* 8.6* 8.9  MG 1.9  --   --   --   --   PHOS 3.0  --   --   --   --    GFR: Estimated Creatinine Clearance: 91.7 mL/min (by C-G formula based on SCr of 0.89 mg/dL). Liver Function Tests: Recent Labs  Lab 06/13/20 0819  AST 22  ALT 24  ALKPHOS 77  BILITOT 0.6  PROT 8.7*  ALBUMIN 4.3   Recent Labs  Lab 06/13/20 0819  LIPASE 46   No results for input(s): AMMONIA in the last 168 hours. Coagulation Profile: No results for input(s): INR, PROTIME in the last 168 hours. Cardiac Enzymes: No results for input(s): CKTOTAL, CKMB, CKMBINDEX, TROPONINI in the last 168 hours. BNP (last 3 results) No results for input(s): PROBNP in the last 8760 hours. HbA1C: No results for input(s): HGBA1C in the last 72  hours. CBG: Recent Labs  Lab 06/18/20 1127 06/18/20 1641 06/18/20 2128 06/19/20 0801 06/19/20 1132  GLUCAP 144* 146* 225* 196* 198*   Lipid Profile: No results for input(s): CHOL, HDL, LDLCALC, TRIG, CHOLHDL, LDLDIRECT in the last 72 hours. Thyroid Function Tests: No results for input(s): TSH, T4TOTAL, FREET4, T3FREE, THYROIDAB in the last 72 hours. Anemia Panel: No results for input(s): VITAMINB12, FOLATE, FERRITIN, TIBC, IRON, RETICCTPCT in the last 72 hours. Sepsis Labs: No results for input(s): PROCALCITON, LATICACIDVEN in the last 168 hours.  Recent Results (from the past 240 hour(s))  Urine Culture     Status: Abnormal   Collection Time: 06/13/20  8:19 AM   Specimen: Urine, Clean Catch  Result Value Ref Range Status   Specimen Description   Final    URINE, CLEAN CATCH Performed at [facility], [address], [place], [place] [address]  Special Requests   Final    NONE Performed at [facility], [place], [place] [address]    Culture >=100,000 COLONIES/mL ESCHERICHIA COLI (A)  Final   Report Status 06/15/2020 FINAL  Final   Organism ID, Bacteria ESCHERICHIA COLI (A)  Final      Susceptibility   Escherichia coli - MIC*    AMPICILLIN >=32 RESISTANT Resistant     CEFAZOLIN 16 SENSITIVE Sensitive     CEFEPIME <=0.12 SENSITIVE Sensitive     CEFTRIAXONE <=0.25 SENSITIVE Sensitive     CIPROFLOXACIN 1 SENSITIVE Sensitive     GENTAMICIN <=1 SENSITIVE Sensitive     IMIPENEM <=0.25 SENSITIVE Sensitive     NITROFURANTOIN <=16 SENSITIVE Sensitive     TRIMETH/SULFA <=20 SENSITIVE Sensitive     AMPICILLIN/SULBACTAM >=32 RESISTANT Resistant     PIP/TAZO <=4 SENSITIVE Sensitive     * >=100,000 COLONIES/mL ESCHERICHIA COLI  Resp Panel by RT-PCR (Flu A&B, Covid) Nasopharyngeal Swab     Status: None   Collection Time: 06/13/20  1:08 PM   Specimen: Nasopharyngeal Swab; Nasopharyngeal(NP) swabs in vial transport medium  Result Value Ref Range  Status   SARS Coronavirus 2 by RT PCR NEGATIVE NEGATIVE Final    Comment: (NOTE) SARS-CoV-2 target nucleic acids are NOT DETECTED.  The SARS-CoV-2 RNA is generally detectable in upper respiratory specimens during the acute phase of infection. The lowest concentration of SARS-CoV-2 viral copies this assay can detect is 138 copies/mL. A negative result does not preclude SARS-Cov-2 infection and should not be used as the sole basis for treatment or other patient management decisions. A negative result may occur with  improper specimen collection/handling, submission of specimen other than nasopharyngeal swab, presence of viral mutation(s) within the areas targeted by this assay, and inadequate number of viral copies(<138 copies/mL). A negative result must be combined with clinical observations, patient history, and epidemiological information. The expected result is Negative.  Fact Sheet for Patients:  EntrepreneurPulse.com.au  Fact Sheet for Healthcare Providers:  IncredibleEmployment.be  This test is no t yet approved or cleared by the [place] FDA and  has been authorized for detection and/or diagnosis of SARS-CoV-2 by FDA under an Emergency Use Authorization (EUA). This EUA will remain  in effect (meaning this test can be used) for the duration of the COVID-19 declaration under Section 564(b)(1) of the Act, 21 U.S.C.section 360bbb-3(b)(1), unless the authorization is terminated  or revoked sooner.       Influenza A by PCR NEGATIVE NEGATIVE Final   Influenza B by PCR NEGATIVE NEGATIVE Final    Comment: (NOTE) The Xpert Xpress SARS-CoV-2/FLU/RSV plus assay is intended as an aid in the diagnosis of influenza from Nasopharyngeal swab specimens and should not be used as a sole basis for treatment. Nasal washings and aspirates are unacceptable for Xpert Xpress SARS-CoV-2/FLU/RSV testing.  Fact Sheet for  Patients: EntrepreneurPulse.com.au  Fact Sheet for Healthcare Providers: IncredibleEmployment.be  This test is not yet approved or cleared by the [place] FDA and has been authorized for detection and/or diagnosis of SARS-CoV-2 by FDA under an Emergency Use Authorization (EUA). This EUA will remain in effect (meaning this test can be used) for the duration of the COVID-19 declaration under Section 564(b)(1) of the Act, 21 U.S.C. section 360bbb-3(b)(1), unless the authorization is terminated or revoked.  Performed at [facility], [address], [place], [place] [address]          Radiology Studies: No results found.  Scheduled Meds: . amLODipine  10 mg Oral Daily  . baclofen  10 mg Oral TID  . budesonide  0.5 mg Inhalation Daily  . carvedilol  12.5 mg Oral BID WC  . clopidogrel  75 mg Oral Daily  . DULoxetine  20 mg Oral QHS  . DULoxetine  40 mg Oral q morning  . enoxaparin (LOVENOX) injection  0.5 mg/kg Subcutaneous Q24H  . gabapentin  1,200 mg Oral QHS  . gabapentin  600 mg Oral BID  . hydrALAZINE  25 mg Oral Q8H  . hydrochlorothiazide  25 mg Oral Daily  . insulin aspart  0-15 Units Subcutaneous TID WC  . insulin aspart  0-5 Units Subcutaneous QHS  . insulin glargine  30 Units Subcutaneous QHS  . ipratropium-albuterol  3 mL Nebulization Daily  . levETIRAcetam  500 mg Oral q morning  . levETIRAcetam  750 mg Oral QHS  . losartan  100 mg Oral Daily  . mometasone-formoterol  2 puff Inhalation BID  . pantoprazole (PROTONIX) IV  40 mg Intravenous Q12H  . simethicone  80 mg Oral QID  . simvastatin  20 mg Oral q1800  . spironolactone  25 mg Oral Daily   Continuous Infusions:   LOS: 6 days    Time spent: 25 minutes    Sidney Ace, MD [facility] Pager [phone]  If 7PM-7AM, please contact night-coverage 06/19/2020, 4:08 PM

## 2020-06-20 DIAGNOSIS — R112 Nausea with vomiting, unspecified: Secondary | ICD-10-CM | POA: Diagnosis not present

## 2020-06-20 LAB — GLUCOSE, CAPILLARY
Glucose-Capillary: 178 mg/dL — ABNORMAL HIGH (ref 70–99)
Glucose-Capillary: 199 mg/dL — ABNORMAL HIGH (ref 70–99)

## 2020-06-20 MED ORDER — AMLODIPINE BESYLATE 10 MG PO TABS: 10.0000 mg | ORAL_TABLET | Freq: Every day | ORAL | 0 refills | Status: DC

## 2020-06-20 MED ORDER — HYDROCHLOROTHIAZIDE 25 MG PO TABS: 25.0000 mg | ORAL_TABLET | Freq: Every day | ORAL | 0 refills | Status: DC

## 2020-06-20 MED ORDER — HYDRALAZINE HCL 25 MG PO TABS: 25.0000 mg | ORAL_TABLET | Freq: Three times a day (TID) | ORAL | 0 refills | Status: DC

## 2020-06-20 NOTE — Discharge Summary (Signed)
Physician Discharge Summary  [name] TKW:[id number] DOB: Mar 01, 1965 DOA: 06/13/2020  PCP: Delsa Grana, [name]  Admit date: 06/13/2020 Discharge date: 06/20/2020  Admitted From: Home Disposition: Home with home health  Recommendations for Outpatient Follow-up:  1. Follow up with PCP in 1-2 weeks 2.   Home Health: Yes Equipment/Devices:None  Discharge Condition:Stable CODE STATUS:Full Diet recommendation: Heart Healthy  Brief/Interim Summary:  55 year old female with history of COPD, diabetes, hypertension anxiety and recent urinary tract infection presented with nausea vomiting abdominal pain and found to have a partial small bowel obstruction and urinary tract infection. Patient was given IV antibiotics and completed a course for UTI. Patient was seen by general surgery and now patient on a diet and having bowel movements  Has been dealing with very markedly elevated blood pressure since 5/30.  Multimodal antihypertensive regimen was initiated with good result. At time of discharge BP well controlled.  Patient unfortunately will have a significant pill burden.  I did discuss this with the patient at bedside as well as her daughter over the phone.  Patient will need close follow-up with a primary care physician in order to further titrate and manage her accelerated hypertension.  Discharge Diagnoses:  Principal Problem:   Nausea & vomiting Active Problems:   Accelerated hypertension   GERD   Class 3 severe obesity with serious comorbidity and body mass index (BMI) of 40.0 to 44.9 in adult [facility])   Type 2 diabetes mellitus with hyperlipidemia (HCC)   OSA (obstructive sleep apnea)   Diabetic polyneuropathy associated with type 2 diabetes mellitus ([place])   History of CVA (cerebrovascular accident)   COPD (chronic obstructive pulmonary disease) (HCC)   CAD (coronary artery disease)   Ileus (HCC)   SBO (small bowel obstruction) (HCC)   Acute cystitis with hematuria    Right sided weakness   Adrenal adenoma, right   Weakness of right arm  Hypertensive urgency Blood pressure elevated since 5/30 Currently better controlled on extensive multimodal antihypertensive regimen Plan: Continue current regimen of Norvasc 10 mg daily, hydrochlorothiazide 25 mg daily, hydralazine 25 mg every 8 hours, Aldactone, Coreg, losartan Close outpatient follow-up  Partial SBO Diet has been advanced and patient is having bowel movements.  Tolerating p.o. intake without issue.  General surgery signed off  Right-sided weakness Stat MRI negative for acute CVA.  Patient on Plavix.  We will continue  Acute cystitis associated with hematuria Urine culture with E. coli.  Completed 5 days of antibiotics  Type 2 diabetes mellitus with hyperlipidemia On basal bolus regimen and sliding scale and carb modified diet Patient on home insulin pump Can resume insulin pump on discharge  Obesity This complicates overall care  Obstructive sleep apnea Home CPAP nightly  COPD Not acutely exacerbated Continue home inhalers and nebulizer regimen  Weakness and falls Appreciate PT follow-up.  Home health recommended  Nonobstructive nephrolithiasis Outpatient urology follow-up  2.7 cm adrenal adenoma Renin/aldosterone ratio is pending.  24-hour metanephrine pending.  Will likely need outpatient follow-up should any of the studies be irregular  Discharge Instructions  Discharge Instructions    Diet - low sodium heart healthy   Complete by: As directed    Increase activity slowly   Complete by: As directed      Allergies as of 06/20/2020      Reactions   Codeine Swelling, Other (See Comments)   Swelling and burning of mouth (inside)   Lactose Intolerance (gi) Nausea And Vomiting      Medication List  STOP taking these medications   Accu-Chek Aviva Plus w/Device Kit   Accu-Chek Guide w/Device Kit   Accu-Chek Softclix Lancets lancets   FreeStyle Libre 2  Sensor Misc     TAKE these medications   Accu-Chek Guide test strip Generic drug: glucose blood Use as instructed to check blood sugar 4 times per day Dx code E11.9   albuterol 1.25 MG/3ML nebulizer solution Commonly known as: ACCUNEB Inhale 1 ampule into the lungs every 6 (six) hours as needed for wheezing or shortness of breath. What changed: Another medication with the same name was changed. Make sure you understand how and when to take each.   albuterol 108 (90 Base) MCG/ACT inhaler Commonly known as: ProAir HFA INHALE 2 PUFFS BY MOUTH EVERY 6 HOURS AS NEEDED FOR WHEEZING OR SHORTNESS OF BREATH What changed:   how much to take  how to take this  when to take this  reasons to take this  additional instructions   amLODipine 10 MG tablet Commonly known as: NORVASC Take 1 tablet (10 mg total) by mouth daily. Start taking on: June 21, 2020   baclofen 10 MG tablet Commonly known as: LIORESAL Take 1 tablet (10 mg total) by mouth 3 (three) times daily.   budesonide 0.5 MG/2ML nebulizer solution Commonly known as: PULMICORT Inhale 0.5 mg into the lungs daily.   carvedilol 12.5 MG tablet Commonly known as: COREG Take 12.5 mg by mouth 2 (two) times daily with a meal.   clopidogrel 75 MG tablet Commonly known as: PLAVIX Take 1 tablet (75 mg total) by mouth daily.   cycloSPORINE 0.05 % ophthalmic emulsion Commonly known as: RESTASIS Place 2 drops into both eyes 2 (two) times daily.   dapagliflozin propanediol 10 MG Tabs tablet Commonly known as: Farxiga Take 1 tablet (10 mg total) by mouth daily before breakfast.   DULoxetine 20 MG capsule Commonly known as: CYMBALTA TAKE 2 CAPSULES EVERY MORNING AND 1 CAPSULE EVERY NIGHT. What changed:   how much to take  how to take this  when to take this  additional instructions   Dupixent 300 MG/2ML Sopn Generic drug: Dupilumab Inject 300 mg into the skin every 14 (fourteen) days.   EPINEPHrine 0.3 mg/0.3 mL Soaj  injection Commonly known as: EPI-PEN Inject 0.3 mg into the muscle as needed for anaphylaxis.   ezetimibe 10 MG tablet Commonly known as: Zetia Take 1 tablet (10 mg total) by mouth daily.   fluticasone 50 MCG/ACT nasal spray Commonly known as: FLONASE Place 2 sprays into both nostrils daily.   gabapentin 600 MG tablet Commonly known as: Neurontin 600 mg in the morning, 600 mg in the afternoon, 1200 mg nightly. What changed:   how much to take  how to take this  when to take this  additional instructions   hydrALAZINE 25 MG tablet Commonly known as: APRESOLINE Take 1 tablet (25 mg total) by mouth every 8 (eight) hours.   hydrochlorothiazide 25 MG tablet Commonly known as: HYDRODIURIL Take 1 tablet (25 mg total) by mouth daily. Start taking on: June 21, 2020   insulin lispro 100 UNIT/ML injection Commonly known as: HumaLOG USE UP TO 150 UNITS/DAY IN INSULIN PUMP   levETIRAcetam 500 MG tablet Commonly known as: KEPPRA Take 500-750 mg by mouth See admin instructions. Take 1 tablet (589m) by mouth every morning and take 1 tablets (7575m by mouth every night   losartan 100 MG tablet Commonly known as: COZAAR Take 1 tablet (100 mg total) by mouth daily.  mirabegron ER 50 MG Tb24 tablet Commonly known as: Myrbetriq Take 1 tablet (50 mg total) by mouth daily.   nitroGLYCERIN 0.4 MG SL tablet Commonly known as: NITROSTAT Place 0.4 mg under the tongue every 5 (five) minutes as needed for chest pain.   pantoprazole 40 MG tablet Commonly known as: PROTONIX Take 1 tablet (40 mg total) by mouth daily.   polyethylene glycol 17 g packet Commonly known as: MIRALAX / GLYCOLAX Take 17 g by mouth daily as needed for mild constipation.   potassium chloride SA 20 MEQ tablet Commonly known as: Klor-Con M20 You have not needed potassium while in the hospital.  Hold until outpatient followup. What changed:   how much to take  how to take this  when to take  this  additional instructions   simvastatin 20 MG tablet Commonly known as: ZOCOR Take 20 mg by mouth daily.   spironolactone 25 MG tablet Commonly known as: ALDACTONE Take 25 mg by mouth daily.   terconazole 0.4 % vaginal cream Commonly known as: TERAZOL 7 Place 1 applicator vaginally at bedtime as needed (irritation).   Trulicity 3 OX/7.3ZH Sopn Generic drug: Dulaglutide Inject 3 mg into the skin once a week. What changed: when to take this   Wixela Inhub 250-50 MCG/ACT Aepb Generic drug: fluticasone-salmeterol Inhale 1 puff into the lungs 2 (two) times daily.            Durable Medical Equipment  (From admission, onward)         Start     Ordered   06/18/20 1208  For home use only DME 4 wheeled rolling walker with seat  Once       Question:  Patient needs a walker to treat with the following condition  Answer:  Weakness   06/18/20 1207          Follow-up Information    Pabon, Marjory Lies, MD. Go on 07/10/2020.   Specialty: General Surgery Why: 10:30am appointment Contact information: [address] [place] [address] [phone]        Hollice Espy, MD. Go on 07/02/2020.   Specialty: Urology Why: 9:30am appointment Contact information: [place] [address] [phone]        Delsa Grana, [name]. Go on 07/11/2020.   Specialty: Family Medicine Why: 2pm appointment Contact information: [address] [place] [address] [phone]              Allergies  Allergen Reactions  . Codeine Swelling and Other (See Comments)    Swelling and burning of mouth (inside)  . Lactose Intolerance (Gi) Nausea And Vomiting    Consultations:  Neurology  Gen surgery   Procedures/Studies: CT HEAD WO CONTRAST  Result Date: 06/15/2020 CLINICAL DATA:  Increased right arm weakness. EXAM: CT HEAD WITHOUT CONTRAST TECHNIQUE: Contiguous axial images were obtained from the base of  the skull through the vertex without intravenous contrast. COMPARISON:  02/04/2018 FINDINGS: Brain: No evidence of acute infarction, hemorrhage, extra-axial collection, ventriculomegaly, or mass effect. Generalized cerebral atrophy. Periventricular white matter low attenuation likely secondary to microangiopathy. Vascular: No hyperdense vessel. No significant intracranial atherosclerotic disease. Skull: Negative for fracture or focal lesion. Right frontal burr hole. Sinuses/Orbits: Visualized portions of the orbits are unremarkable. Visualized portions of the paranasal sinuses are unremarkable. Visualized portions of the mastoid air cells are unremarkable. Other: None. IMPRESSION: No acute intracranial pathology. Electronically Signed   By: Kathreen Devoid   On: 06/15/2020  14:59   MR BRAIN WO CONTRAST  Result Date: 06/16/2020 CLINICAL DATA:  Concern for stroke.  Right hand weakness. EXAM: MRI HEAD WITHOUT CONTRAST TECHNIQUE: Multiplanar, multiecho pulse sequences of the brain and surrounding structures were obtained without intravenous contrast. COMPARISON:  November 28, 2018. FINDINGS: Brain: No acute infarction, hemorrhage, hydrocephalus, extra-axial collection or mass lesion. Mild generalized atrophy. Vascular: Major arterial flow voids are maintained at the skull base. Skull and upper cervical spine: Normal marrow signal. Sinuses/Orbits: Clear sinuses. Bilateral proptosis with prominent retro bulbar fat. Otherwise, unremarkable orbits. Other: No sizable mastoid effusion. IMPRESSION: No evidence of acute intracranial abnormality. Specifically, no acute infarct. Electronically Signed   By: Margaretha Sheffield MD   On: 06/16/2020 17:48   DG ABD ACUTE 2+V W 1V CHEST  Result Date: 06/14/2020 CLINICAL DATA:  54 year old with nausea and vomiting. EXAM: DG ABDOMEN ACUTE WITH 1 VIEW CHEST COMPARISON:  Chest radiograph 05/15/2019 and abdominal radiographs 10/12/2011 FINDINGS: Prominent interstitial lung markings are  similar to the previous chest radiograph. There could be a component of chronic vascular congestion. Heart size is within normal limits and stable. No clear evidence for free air on these AP sitting views. Nonobstructive bowel gas pattern with gas in small and large bowel. No large abdominal calcifications. IMPRESSION: 1. Normal bowel gas pattern. 2. Prominent interstitial and vascular lung markings. Findings are similar to the previous examination. No acute cardiopulmonary disease. Electronically Signed   By: Markus Daft M.D.   On: 06/14/2020 08:27   CT Renal Stone Study  Result Date: 06/13/2020 CLINICAL DATA:  Urinary tract stone. EXAM: CT ABDOMEN AND PELVIS WITHOUT CONTRAST TECHNIQUE: Multidetector CT imaging of the abdomen and pelvis was performed following the standard protocol without IV contrast. COMPARISON:  05/26/2020 FINDINGS: Lower chest:  Mild atelectasis at the bases Hepatobiliary: No focal liver abnormality.No evidence of biliary obstruction or stone. Pancreas: Unremarkable. Spleen: Unremarkable. Adrenals/Urinary Tract: 2.7 cm low-density right adrenal mass consistent with adenoma. No hydronephrosis or ureteral stone. Multiple renal calculi, numbering at least 7 on the right and solitary on the left. Lower pole renal cortical thinning on the right is attributed to the scar. The largest stone is measured at 6 mm in the right upper pole. Unremarkable bladder. Stomach/Bowel: Dilated and fluid-filled small bowel loops with collapsed ileal loops although a specific transition point is not clearly identified. The colon is relatively decompressed. Distal colonic diverticulosis. No bowel wall thickening. Vascular/Lymphatic: No acute vascular abnormality. No mass or adenopathy. Reproductive:Hysterectomy Other: No ascites or pneumoperitoneum. Musculoskeletal: No acute abnormalities. IMPRESSION: 1. Pattern of partial small bowel obstruction although a discrete transition point is not identified. 2. No  hydronephrosis or ureteral calculus. 3. Right more than left nonobstructing renal calculi. Electronically Signed   By: Monte Fantasia M.D.   On: 06/13/2020 12:05   CT Renal Stone Study  Result Date: 05/26/2020 CLINICAL DATA:  Low back pain, flank pain. EXAM: CT ABDOMEN AND PELVIS WITHOUT CONTRAST TECHNIQUE: Multidetector CT imaging of the abdomen and pelvis was performed following the standard protocol without IV contrast. COMPARISON:  CT abdomen pelvis dated 12/05/2018. FINDINGS: Lower chest: Mild left basilar atelectasis. Hepatobiliary: No focal liver abnormality is seen. No gallstones, gallbladder wall thickening, or biliary dilatation. Pancreas: Unremarkable. No pancreatic ductal dilatation or surrounding inflammatory changes. Spleen: Normal in size without focal abnormality. Adrenals/Urinary Tract: A benign adenoma in the right adrenal gland measures 2.1 cm. Bilateral nonobstructive renal calculi measure up to 5 mm on the right and 2 mm on the left. No  hydronephrosis. Calcification adjacent to the left ureter is likely within a blood vessel (series 5, image 96 and series 2 image 41). Bladder is unremarkable. Stomach/Bowel: Stomach is within normal limits. Appendix appears normal. No evidence of bowel wall thickening, distention, or inflammatory changes. Vascular/Lymphatic: No significant vascular findings are present. No enlarged abdominal or pelvic lymph nodes. Reproductive: Status post hysterectomy. No adnexal masses. Other: No abdominal wall hernia or abnormality. No abdominopelvic ascites. Musculoskeletal: Degenerative changes are seen in the spine. IMPRESSION: Nonobstructive bilateral renal calculi.  No hydronephrosis. Electronically Signed   By: Zerita Boers M.D.   On: 05/26/2020 13:38    (Echo, Carotid, EGD, Colonoscopy, ERCP)    Subjective: Seen and examined on the day of discharge.  No distress.  Feels well.  Stable for discharge home at this time.  Home health ordered.  Discharge  Exam: Vitals:   06/20/20 0847 06/20/20 1132  BP: 138/79 (!) 148/60  Pulse: 79 75  Resp: 16 15  Temp: 97.6 F (36.4 C) 98 F (36.7 C)  SpO2: 100% 100%   Vitals:   06/20/20 0428 06/20/20 0750 06/20/20 0847 06/20/20 1132  BP: 118/77  138/79 (!) 148/60  Pulse: 76  79 75  Resp: _0 Temp: 98.1 F (36.7 C)  97.6 F (36.4 C) 98 F (36.7 C)  TempSrc:   Oral Oral  SpO2: 100% 99% 100% 100%  Weight: 118.1 kg     Height:        General: Pt is alert, awake, not in acute distress Cardiovascular: RRR, S1/S2 +, no rubs, no gallops Respiratory: CTA bilaterally, no wheezing, no rhonchi Abdominal: Soft, NT, ND, bowel sounds + Extremities: no edema, no cyanosis    The results of significant diagnostics from this hospitalization (including imaging, microbiology, ancillary and laboratory) are listed below for reference.     Microbiology: Recent Results (from the past 240 hour(s))  Urine Culture     Status: Abnormal   Collection Time: 06/13/20  8:19 AM   Specimen: Urine, Clean Catch  Result Value Ref Range Status   Specimen Description   Final    URINE, CLEAN CATCH Performed at [facility], [address] [place], [place] [address]    Special Requests   Final    NONE Performed at [facility], [place], [place] [address]    Culture >=100,000 COLONIES/mL ESCHERICHIA COLI (A)  Final   Report Status 06/15/2020 FINAL  Final   Organism ID, Bacteria ESCHERICHIA COLI (A)  Final      Susceptibility   Escherichia coli - MIC*    AMPICILLIN >=32 RESISTANT Resistant     CEFAZOLIN 16 SENSITIVE Sensitive     CEFEPIME <=0.12 SENSITIVE Sensitive     CEFTRIAXONE <=0.25 SENSITIVE Sensitive     CIPROFLOXACIN 1 SENSITIVE Sensitive     GENTAMICIN <=1 SENSITIVE Sensitive     IMIPENEM <=0.25 SENSITIVE Sensitive     NITROFURANTOIN <=16 SENSITIVE Sensitive     TRIMETH/SULFA <=20 SENSITIVE Sensitive     AMPICILLIN/SULBACTAM >=32 RESISTANT Resistant      PIP/TAZO <=4 SENSITIVE Sensitive     * >=100,000 COLONIES/mL ESCHERICHIA COLI  Resp Panel by RT-PCR (Flu A&B, Covid) Nasopharyngeal Swab     Status: None   Collection Time: 06/13/20  1:08 PM   Specimen: Nasopharyngeal Swab; Nasopharyngeal(NP) swabs in vial transport medium  Result Value Ref Range Status   SARS Coronavirus 2 by RT PCR NEGATIVE NEGATIVE Final    Comment: (NOTE) SARS-CoV-2  target nucleic acids are NOT DETECTED.  The SARS-CoV-2 RNA is generally detectable in upper respiratory specimens during the acute phase of infection. The lowest concentration of SARS-CoV-2 viral copies this assay can detect is 138 copies/mL. A negative result does not preclude SARS-Cov-2 infection and should not be used as the sole basis for treatment or other patient management decisions. A negative result may occur with  improper specimen collection/handling, submission of specimen other than nasopharyngeal swab, presence of viral mutation(s) within the areas targeted by this assay, and inadequate number of viral copies(<138 copies/mL). A negative result must be combined with clinical observations, patient history, and epidemiological information. The expected result is Negative.  Fact Sheet for Patients:  EntrepreneurPulse.com.au  Fact Sheet for Healthcare Providers:  IncredibleEmployment.be  This test is no t yet approved or cleared by the [place] FDA and  has been authorized for detection and/or diagnosis of SARS-CoV-2 by FDA under an Emergency Use Authorization (EUA). This EUA will remain  in effect (meaning this test can be used) for the duration of the COVID-19 declaration under Section 564(b)(1) of the Act, 21 U.S.C.section 360bbb-3(b)(1), unless the authorization is terminated  or revoked sooner.       Influenza A by PCR NEGATIVE NEGATIVE Final   Influenza B by PCR NEGATIVE NEGATIVE Final    Comment: (NOTE) The Xpert Xpress  SARS-CoV-2/FLU/RSV plus assay is intended as an aid in the diagnosis of influenza from Nasopharyngeal swab specimens and should not be used as a sole basis for treatment. Nasal washings and aspirates are unacceptable for Xpert Xpress SARS-CoV-2/FLU/RSV testing.  Fact Sheet for Patients: EntrepreneurPulse.com.au  Fact Sheet for Healthcare Providers: IncredibleEmployment.be  This test is not yet approved or cleared by the [place] FDA and has been authorized for detection and/or diagnosis of SARS-CoV-2 by FDA under an Emergency Use Authorization (EUA). This EUA will remain in effect (meaning this test can be used) for the duration of the COVID-19 declaration under Section 564(b)(1) of the Act, 21 U.S.C. section 360bbb-3(b)(1), unless the authorization is terminated or revoked.  Performed at [facility], [address] [place], [place] [address]      Labs: BNP (last 3 results) No results for input(s): BNP in the last 8760 hours. Basic Metabolic Panel: Recent Labs  Lab 06/14/20 0738 06/15/20 0417 06/16/20 0634 06/17/20 0553 06/18/20 0853  NA 138 138 139 140 138  K 3.8 3.8 3.9 3.9 3.8  CL 108 108 111 109 104  CO2 _0 GLUCOSE 147* 171* 135* 140* 182*  BUN _1 CREATININE 0.86 0.88 0.83 0.83 0.89  CALCIUM 8.2* 8.6* 8.4* 8.6* 8.9  MG 1.9  --   --   --   --   PHOS 3.0  --   --   --   --    Liver Function Tests: No results for input(s): AST, ALT, ALKPHOS, BILITOT, PROT, ALBUMIN in the last 168 hours. No results for input(s): LIPASE, AMYLASE in the last 168 hours. No results for input(s): AMMONIA in the last 168 hours. CBC: Recent Labs  Lab 06/14/20 0738 06/15/20 0417  WBC 8.5 6.5  HGB 11.9* 12.4  HCT 36.6 38.8  MCV 88.4 89.6  PLT 237 235   Cardiac Enzymes: No results for input(s): CKTOTAL, CKMB, CKMBINDEX, TROPONINI in the last 168 hours. BNP: Invalid input(s): POCBNP CBG: Recent Labs   Lab 06/19/20 1132 06/19/20 1631 06/19/20 2223 06/20/20 0745 06/20/20 1132  GLUCAP 198* 215*  265* 178* 199*   D-Dimer No results for input(s): DDIMER in the last 72 hours. Hgb A1c No results for input(s): HGBA1C in the last 72 hours. Lipid Profile No results for input(s): CHOL, HDL, LDLCALC, TRIG, CHOLHDL, LDLDIRECT in the last 72 hours. Thyroid function studies No results for input(s): TSH, T4TOTAL, T3FREE, THYROIDAB in the last 72 hours.  Invalid input(s): FREET3 Anemia work up No results for input(s): VITAMINB12, FOLATE, FERRITIN, TIBC, IRON, RETICCTPCT in the last 72 hours. Urinalysis    Component Value Date/Time   COLORURINE YELLOW (A) 06/13/2020 0819   APPEARANCEUR CLOUDY (A) 06/13/2020 0819   LABSPEC 1.018 06/13/2020 0819   PHURINE 5.0 06/13/2020 0819   GLUCOSEU NEGATIVE 06/13/2020 0819   GLUCOSEU NEGATIVE 09/21/2019 1038   HGBUR MODERATE (A) 06/13/2020 0819   HGBUR negative 09/30/2006 1400   BILIRUBINUR NEGATIVE 06/13/2020 0819   BILIRUBINUR negative 06/04/2020 [place] 06/13/2020 0819   PROTEINUR 100 (A) 06/13/2020 0819   UROBILINOGEN 0.2 06/04/2020 1535   UROBILINOGEN 0.2 09/21/2019 1038   NITRITE POSITIVE (A) 06/13/2020 0819   LEUKOCYTESUR LARGE (A) 06/13/2020 0819   Sepsis Labs Invalid input(s): PROCALCITONIN,  WBC,  LACTICIDVEN Microbiology Recent Results (from the past 240 hour(s))  Urine Culture     Status: Abnormal   Collection Time: 06/13/20  8:19 AM   Specimen: Urine, Clean Catch  Result Value Ref Range Status   Specimen Description   Final    URINE, CLEAN CATCH Performed at [facility], [address] [place], [place] [address]    Special Requests   Final    NONE Performed at [facility], [address] [place], [place] [address]    Culture >=100,000 COLONIES/mL ESCHERICHIA COLI (A)  Final   Report Status 06/15/2020 FINAL  Final   Organism ID, Bacteria ESCHERICHIA COLI (A)  Final       Susceptibility   Escherichia coli - MIC*    AMPICILLIN >=32 RESISTANT Resistant     CEFAZOLIN 16 SENSITIVE Sensitive     CEFEPIME <=0.12 SENSITIVE Sensitive     CEFTRIAXONE <=0.25 SENSITIVE Sensitive     CIPROFLOXACIN 1 SENSITIVE Sensitive     GENTAMICIN <=1 SENSITIVE Sensitive     IMIPENEM <=0.25 SENSITIVE Sensitive     NITROFURANTOIN <=16 SENSITIVE Sensitive     TRIMETH/SULFA <=20 SENSITIVE Sensitive     AMPICILLIN/SULBACTAM >=32 RESISTANT Resistant     PIP/TAZO <=4 SENSITIVE Sensitive     * >=100,000 COLONIES/mL ESCHERICHIA COLI  Resp Panel by RT-PCR (Flu A&B, Covid) Nasopharyngeal Swab     Status: None   Collection Time: 06/13/20  1:08 PM   Specimen: Nasopharyngeal Swab; Nasopharyngeal(NP) swabs in vial transport medium  Result Value Ref Range Status   SARS Coronavirus 2 by RT PCR NEGATIVE NEGATIVE Final    Comment: (NOTE) SARS-CoV-2 target nucleic acids are NOT DETECTED.  The SARS-CoV-2 RNA is generally detectable in upper respiratory specimens during the acute phase of infection. The lowest concentration of SARS-CoV-2 viral copies this assay can detect is 138 copies/mL. A negative result does not preclude SARS-Cov-2 infection and should not be used as the sole basis for treatment or other patient management decisions. A negative result may occur with  improper specimen collection/handling, submission of specimen other than nasopharyngeal swab, presence of viral mutation(s) within the areas targeted by this assay, and inadequate number of viral copies(<138 copies/mL). A negative result must be combined with clinical observations, patient history, and epidemiological information. The expected result is Negative.  Fact Sheet for Patients:  EntrepreneurPulse.com.au  Fact Sheet for Healthcare Providers:  IncredibleEmployment.be  This test is no t yet approved or cleared by the [place] FDA and  has been authorized for detection and/or  diagnosis of SARS-CoV-2 by FDA under an Emergency Use Authorization (EUA). This EUA will remain  in effect (meaning this test can be used) for the duration of the COVID-19 declaration under Section 564(b)(1) of the Act, 21 U.S.C.section 360bbb-3(b)(1), unless the authorization is terminated  or revoked sooner.       Influenza A by PCR NEGATIVE NEGATIVE Final   Influenza B by PCR NEGATIVE NEGATIVE Final    Comment: (NOTE) The Xpert Xpress SARS-CoV-2/FLU/RSV plus assay is intended as an aid in the diagnosis of influenza from Nasopharyngeal swab specimens and should not be used as a sole basis for treatment. Nasal washings and aspirates are unacceptable for Xpert Xpress SARS-CoV-2/FLU/RSV testing.  Fact Sheet for Patients: EntrepreneurPulse.com.au  Fact Sheet for Healthcare Providers: IncredibleEmployment.be  This test is not yet approved or cleared by the [place] FDA and has been authorized for detection and/or diagnosis of SARS-CoV-2 by FDA under an Emergency Use Authorization (EUA). This EUA will remain in effect (meaning this test can be used) for the duration of the COVID-19 declaration under Section 564(b)(1) of the Act, 21 U.S.C. section 360bbb-3(b)(1), unless the authorization is terminated or revoked.  Performed at [facility], [address], ,  [address]      Time coordinating discharge: Over 30 minutes  SIGNED:   Sidney Ace, MD  [facility] 06/20/2020, 4:13 PM Pager   If 7PM-7AM, please contact night-coverage

## 2020-06-20 NOTE — Care Management Important Message (Signed)
Important Message  Patient Details  Name: [name] MRN: [id number] Date of Birth: [birth year]   Medicare Important Message Given:  No  Patient discharged prior to arrival to unit to deliver concurrent Medicare IM.  Initial reviewed with patient by S. Geanie Cooley, Patient Access Associate on 06/14/2020 at 9:41am.     Dannette Barbara 06/20/2020, 1:24 PM

## 2020-06-26 ENCOUNTER — Telehealth: Payer: Self-pay | Admitting: Emergency Medicine

## 2020-06-26 LAB — METANEPHRINES, URINE, 24 HOUR
Metaneph Total, Ur: 31 ug/L
Metanephrines, 24H Ur: 105 ug/24 hr (ref 36–209)
Normetanephrine, 24H Ur: 384 ug/24 hr (ref 131–612)
Normetanephrine, Ur: 113 ug/L
Total Volume: 3400

## 2020-06-26 NOTE — Telephone Encounter (Signed)
FYI  [name] called this morning to inform you that on the day she was to bring the urine specimen to the office she woke up with diarrhea and vomiting. She was sent to the ER and then admitted for a few days  where she stated that she had a bowel blockage and a lump on her kidney. She will see Urology today.

## 2020-06-28 LAB — ALDOSTERONE + RENIN ACTIVITY W/ RATIO
ALDO / PRA Ratio: 4.4 (ref 0.0–30.0)
Aldosterone: 4.1 ng/dL (ref 0.0–30.0)
PRA LC/MS/MS: 0.931 ng/mL/hr (ref 0.167–5.380)

## 2020-07-02 ENCOUNTER — Ambulatory Visit: Payer: Medicare Other | Admitting: Urology

## 2020-07-05 ENCOUNTER — Encounter: Payer: Self-pay | Admitting: Urology

## 2020-07-10 ENCOUNTER — Inpatient Hospital Stay: Payer: Medicare Other | Admitting: Surgery

## 2020-07-11 ENCOUNTER — Encounter: Payer: Self-pay | Admitting: Family Medicine

## 2020-07-11 ENCOUNTER — Inpatient Hospital Stay: Payer: Medicare Other | Admitting: Family Medicine

## 2020-07-15 ENCOUNTER — Other Ambulatory Visit: Payer: Self-pay

## 2020-07-15 ENCOUNTER — Ambulatory Visit (INDEPENDENT_AMBULATORY_CARE_PROVIDER_SITE_OTHER): Payer: Medicare Other | Admitting: Family Medicine

## 2020-07-15 ENCOUNTER — Encounter: Payer: Self-pay | Admitting: Family Medicine

## 2020-07-15 VITALS — BP 132/78 | HR 97 | Temp 98.1°F | Resp 16 | Ht 70.0 in | Wt 257.7 lb

## 2020-07-15 DIAGNOSIS — E1169 Type 2 diabetes mellitus with other specified complication: Secondary | ICD-10-CM

## 2020-07-15 DIAGNOSIS — I10 Essential (primary) hypertension: Secondary | ICD-10-CM

## 2020-07-15 DIAGNOSIS — K56609 Unspecified intestinal obstruction, unspecified as to partial versus complete obstruction: Secondary | ICD-10-CM

## 2020-07-15 DIAGNOSIS — J449 Chronic obstructive pulmonary disease, unspecified: Secondary | ICD-10-CM

## 2020-07-15 DIAGNOSIS — E785 Hyperlipidemia, unspecified: Secondary | ICD-10-CM

## 2020-07-15 DIAGNOSIS — I25119 Atherosclerotic heart disease of native coronary artery with unspecified angina pectoris: Secondary | ICD-10-CM | POA: Diagnosis not present

## 2020-07-15 DIAGNOSIS — D3501 Benign neoplasm of right adrenal gland: Secondary | ICD-10-CM

## 2020-07-15 DIAGNOSIS — N3001 Acute cystitis with hematuria: Secondary | ICD-10-CM

## 2020-07-15 LAB — BASIC METABOLIC PANEL
BUN: 17 mg/dL (ref 7–25)
CO2: 30 mmol/L (ref 20–32)
Calcium: 9 mg/dL (ref 8.6–10.4)
Chloride: 104 mmol/L (ref 98–110)
Creat: 0.88 mg/dL (ref 0.50–1.05)
Glucose, Bld: 233 mg/dL — ABNORMAL HIGH (ref 65–99)
Potassium: 4.4 mmol/L (ref 3.5–5.3)
Sodium: 141 mmol/L (ref 135–146)

## 2020-07-15 NOTE — Assessment & Plan Note (Signed)
Resolved

## 2020-07-15 NOTE — Assessment & Plan Note (Signed)
Partial. Passing BMs. Continue to follow with surgery.

## 2020-07-15 NOTE — Assessment & Plan Note (Signed)
Encouraged to make f/u with Endo.

## 2020-07-15 NOTE — Assessment & Plan Note (Addendum)
Controlled today, no med changes made. Will check BMP. F/u in 3 months.

## 2020-07-15 NOTE — Progress Notes (Signed)
    SUBJECTIVE:   CHIEF COMPLAINT / HPI:   HOSPITAL FOLLOW UP Time since discharge: 25 days Hospital/facility: [facility] 5/26-6/2 Diagnosis: partial SBO, UTI Procedures/tests:  - CT renal - R>L nonobstructing renal calculi, partial SBO, adrenal adenoma (urine mets, aldosterone/renin studies wnl) - CT head, brain MRI - NAICA Consultants: Gen Surg New medications: miralax, amlodipine, HCTZ Discharge instructions:   - f/u with Urology, was placed on chronic abx. Has f/u 7/11. - f/u with Surgery 6/29. Status: stable.  - same. Still with pain, generalized weakness  - has had BM since discharge, usually every 3-4 days. Last 3 days ago. Taking miralax.   Hypertension: - Medications: amlodipine 90m, coreg 12.547m hydralazine 2572mID, HCTZ 66m59mpironolactone 66mg26mompliance: good - Checking BP at home: yes, 170 SBP 3 days ago.  - Denies any SOB, CP LE edema   OBJECTIVE:   BP 132/78   Pulse 97   Temp 98.1 F (36.7 C)   Resp 16   Ht 5' 10"  (1.778 m)   Wt 257 lb 11.2 oz (116.9 kg)   LMP  (LMP Unknown)   SpO2 99%   BMI 36.98 kg/m   Gen: obese, well appearing, in NAD Card: RRR Lungs: CTAB Abd: soft, TTP in RLQ, nondistended.  ASSESSMENT/PLAN:   Accelerated hypertension Controlled today, no med changes made. Will check BMP. F/u in 3 months.  SBO (small bowel obstruction) (HCC) Partial. Passing BMs. Continue to follow with surgery.   Adrenal adenoma, right Lab w/u unrevealing. Continue to follow with urology.  Type 2 diabetes mellitus with hyperlipidemia (HCC) [place] with [place]o.  Acute cystitis with hematuria Resolved.     [name]

## 2020-07-15 NOTE — Patient Instructions (Signed)
It was great to see you!  Our plans for today:  - No medication changes today.  - Continue to keep your appointments with Urology, Surgery. Call for an Endocrinology appointment - Call [facility] to get PT set up. We will try to help with this also. - If you develop severe or worsening abdominal pain, inability to have a bowel movement or pass gas, or have blood in your stool, seek medical attention immediately.  We are checking some labs today, we will release these results to your MyChart.  Take care and seek immediate care sooner if you develop any concerns.   Dr. Ky Barban

## 2020-07-15 NOTE — Assessment & Plan Note (Signed)
Lab w/u unrevealing. Continue to follow with urology.

## 2020-07-17 ENCOUNTER — Inpatient Hospital Stay: Payer: Medicare Other | Admitting: Surgery

## 2020-07-17 ENCOUNTER — Telehealth: Payer: Self-pay | Admitting: *Deleted

## 2020-07-17 ENCOUNTER — Other Ambulatory Visit: Payer: Self-pay | Admitting: Family Medicine

## 2020-07-17 ENCOUNTER — Telehealth: Payer: Self-pay

## 2020-07-17 DIAGNOSIS — J984 Other disorders of lung: Secondary | ICD-10-CM

## 2020-07-17 DIAGNOSIS — I25119 Atherosclerotic heart disease of native coronary artery with unspecified angina pectoris: Secondary | ICD-10-CM

## 2020-07-17 DIAGNOSIS — E1142 Type 2 diabetes mellitus with diabetic polyneuropathy: Secondary | ICD-10-CM

## 2020-07-17 DIAGNOSIS — I69359 Hemiplegia and hemiparesis following cerebral infarction affecting unspecified side: Secondary | ICD-10-CM

## 2020-07-17 DIAGNOSIS — J449 Chronic obstructive pulmonary disease, unspecified: Secondary | ICD-10-CM

## 2020-07-17 DIAGNOSIS — R531 Weakness: Secondary | ICD-10-CM

## 2020-07-17 DIAGNOSIS — K56609 Unspecified intestinal obstruction, unspecified as to partial versus complete obstruction: Secondary | ICD-10-CM

## 2020-07-17 NOTE — Chronic Care Management (AMB) (Signed)
  Chronic Care Management   Note  07/17/2020 Name: [name] MRN: [id number] DOB: 1965-12-20  [name] is a 55 y.o. year old female who is a primary care patient of Delsa Grana, [place]. I reached out to [name] by phone today in response to a referral sent by [name]'s PCP Delsa Grana, [name]     [name] was given information about Chronic Care Management services today including:  CCM service includes personalized support from designated clinical staff supervised by her physician, including individualized plan of care and coordination with other care providers 24/7 contact phone numbers for assistance for urgent and routine care needs. Service will only be billed when office clinical staff spend 20 minutes or more in a month to coordinate care. Only one practitioner may furnish and bill the service in a calendar month. The patient may stop CCM services at any time (effective at the end of the month) by phone call to the office staff. The patient will be responsible for cost sharing (co-pay) of up to 20% of the service fee (after annual deductible is met).  Patient agreed to services and verbal consent obtained.   Follow up plan: Telephone appointment with care management team member scheduled for:07/25/2020  Julian Hy, [place] [facility]  Direct Dial: [phone]

## 2020-07-17 NOTE — Telephone Encounter (Signed)
Copied from [place] [phone]. Topic: Referral - Request for Referral >> Jul 17, 2020 11:12 AM Celene Kras wrote: Has patient seen PCP for this complaint? Yes.   *If NO, is insurance requiring patient see PCP for this issue before PCP can refer them? Referral for which specialty: [place] Preferred provider/office: [facility]// Phone #: [phone]// Fax#: [phone] Reason for referral: Pt states that she has contacted [facility] regarding the referral that was supposed to be placed after Hospital stay. She states that they have no referral for her and is requesting to have this sent over. Please advise.

## 2020-07-25 ENCOUNTER — Ambulatory Visit (INDEPENDENT_AMBULATORY_CARE_PROVIDER_SITE_OTHER): Payer: Medicare Other

## 2020-07-25 DIAGNOSIS — J449 Chronic obstructive pulmonary disease, unspecified: Secondary | ICD-10-CM | POA: Diagnosis not present

## 2020-07-25 DIAGNOSIS — I10 Essential (primary) hypertension: Secondary | ICD-10-CM | POA: Diagnosis not present

## 2020-07-25 DIAGNOSIS — E782 Mixed hyperlipidemia: Secondary | ICD-10-CM | POA: Diagnosis not present

## 2020-07-25 DIAGNOSIS — E1169 Type 2 diabetes mellitus with other specified complication: Secondary | ICD-10-CM

## 2020-07-25 DIAGNOSIS — E785 Hyperlipidemia, unspecified: Secondary | ICD-10-CM

## 2020-07-25 DIAGNOSIS — Z9181 History of falling: Secondary | ICD-10-CM

## 2020-07-25 NOTE — Chronic Care Management (AMB) (Signed)
Chronic Care Management   Initial Visit Note  07/25/2020 Name: [name] MRN: [id number] DOB: 1965/09/19  Primary Care Provider: Delsa Grana, [name] Reason for referral : Chronic Care Management   [name] is a 55 y.o. year old female who is a primary care patient of Delsa Grana, [place]. The CCM team was consulted for assistance with chronic disease management and care coordination.  Review of [name]'s status, including review of consultants reports, relevant labs and test results was conducted today. Collaboration with appropriate care team members was performed as part of the comprehensive evaluation and provision of chronic care management services.     SDOH (Social Determinants of Health) assessments performed: Yes SDOH Interventions    Flowsheet Row Most Recent Value  SDOH Interventions   Food Insecurity Interventions Intervention Not Indicated  Transportation Interventions Intervention Not Indicated, Other (Comment)  [Currently using [employer] via [employer]]        Medications: Outpatient Encounter Medications as of 07/25/2020  Medication Sig Note   albuterol (ACCUNEB) 1.25 MG/3ML nebulizer solution Inhale 1 ampule into the lungs every 6 (six) hours as needed for wheezing or shortness of breath.    albuterol (PROAIR HFA) 108 (90 Base) MCG/ACT inhaler INHALE 2 PUFFS BY MOUTH EVERY 6 HOURS AS NEEDED FOR WHEEZING OR SHORTNESS OF BREATH (Patient taking differently: Inhale 2 puffs into the lungs every 6 (six) hours as needed for wheezing or shortness of breath.)    baclofen (LIORESAL) 10 MG tablet Take 1 tablet (10 mg total) by mouth 3 (three) times daily.    budesonide (PULMICORT) 0.5 MG/2ML nebulizer solution Inhale 0.5 mg into the lungs daily.    carvedilol (COREG) 12.5 MG tablet Take 12.5 mg by mouth 2 (two) times daily with a meal.    cholecalciferol (VITAMIN D3) 25 MCG (1000 UNIT) tablet Take 1,000 Units by mouth daily.    clopidogrel (PLAVIX) 75 MG tablet  Take 1 tablet (75 mg total) by mouth daily.    dapagliflozin propanediol (FARXIGA) 10 MG TABS tablet Take 1 tablet (10 mg total) by mouth daily before breakfast.    DULoxetine (CYMBALTA) 20 MG capsule TAKE 2 CAPSULES EVERY MORNING AND 1 CAPSULE EVERY NIGHT.    EPINEPHrine 0.3 mg/0.3 mL IJ SOAJ injection Inject 0.3 mg into the muscle as needed for anaphylaxis.    ezetimibe (ZETIA) 10 MG tablet Take 1 tablet (10 mg total) by mouth daily.    fluticasone (FLONASE) 50 MCG/ACT nasal spray Place 2 sprays into both nostrils daily.    gabapentin (NEURONTIN) 600 MG tablet 600 mg in the morning, 600 mg in the afternoon, 1200 mg nightly. (Patient taking differently: Take 600-1,200 mg by mouth See admin instructions. Take 1 tablet (615m) by mouth every morning and afternoon and then take 2 tablets (12024m by mouth every night)    glucose blood (ACCU-CHEK GUIDE) test strip Use as instructed to check blood sugar 4 times per day Dx code E11.9    insulin lispro (HUMALOG) 100 UNIT/ML injection USE UP TO 150 UNITS/DAY IN INSULIN PUMP    levETIRAcetam (KEPPRA) 500 MG tablet Take 500-750 mg by mouth See admin instructions. Take 1 tablet (50054mby mouth every morning and take 1 tablets (750m61my mouth every night    losartan (COZAAR) 100 MG tablet Take 1 tablet (100 mg total) by mouth daily.    mirabegron ER (MYRBETRIQ) 50 MG TB24 tablet Take 1 tablet (50 mg total) by mouth daily.    nitroGLYCERIN (NITROSTAT) 0.4 MG SL tablet  Place 0.4 mg under the tongue every 5 (five) minutes as needed for chest pain.    pantoprazole (PROTONIX) 40 MG tablet Take 1 tablet (40 mg total) by mouth daily.    polyethylene glycol (MIRALAX / GLYCOLAX) 17 g packet Take 17 g by mouth daily as needed for mild constipation.    potassium chloride SA (KLOR-CON M20) 20 MEQ tablet You have not needed potassium while in the hospital.  Hold until outpatient followup. (Patient taking differently: Take 20 mEq by mouth 2 (two) times daily.)     simvastatin (ZOCOR) 20 MG tablet Take 20 mg by mouth daily.    spironolactone (ALDACTONE) 25 MG tablet Take 25 mg by mouth daily.    terconazole (TERAZOL 7) 0.4 % vaginal cream Place 1 applicator vaginally at bedtime as needed (irritation).    WIXELA INHUB 250-50 MCG/DOSE AEPB Inhale 1 puff into the lungs 2 (two) times daily.    amLODipine (NORVASC) 10 MG tablet Take 1 tablet (10 mg total) by mouth daily.    cycloSPORINE (RESTASIS) 0.05 % ophthalmic emulsion Place 2 drops into both eyes 2 (two) times daily. 07/25/2020: Reports speaking with local pharmacy. Pending receipt.   Dulaglutide (TRULICITY) 3 OT/7.7NH SOPN Inject 3 mg into the skin once a week. (Patient taking differently: Inject 3 mg into the skin every Friday.)    hydrALAZINE (APRESOLINE) 25 MG tablet Take 1 tablet (25 mg total) by mouth every 8 (eight) hours.    hydrochlorothiazide (HYDRODIURIL) 25 MG tablet Take 1 tablet (25 mg total) by mouth daily.    No facility-administered encounter medications on file as of 07/25/2020.     Objective:  Patient Care Plan: Diabetes Type 2 (Adult)     Problem Identified: Glycemic Management (Diabetes, Type 2)      Long-Range Goal: Glycemic Management Optimized   Start Date: 07/25/2020  Expected End Date: 11/22/2020  Priority: High  Note:   Objective:  Lab Results  Component Value Date   HGBA1C 7.8 (H) 06/10/2020   Lab Results  Component Value Date   CREATININE 0.88 07/15/2020   CREATININE 0.89 06/18/2020   CREATININE 0.83 06/17/2020   No results found for: EGFR   Current Barriers:  Chronic Disease Management support and educational needs related to  Diabetes management.  Case Manager Clinical Goal(s):  Over the next 120 days, patient will demonstrate improved adherence to prescribed treatment plan for diabetes self care/management as evidenced by monitoring of CBG's as instructed, adherence to ADA/ carb modified diet and adherence to prescribed medication regimen.  Interventions:   Collaboration with Delsa Grana, [name] regarding development and update of comprehensive plan of care as evidenced by provider attestation and co-signature Inter-disciplinary care team collaboration (see longitudinal plan of care) Reviewed medications and discussed importance of medication adherence. Reports taking as prescribed. Insulin currently being administered via an Omnipod. Denies current concerns regarding medication management or prescription cost. Discussed importance of monitoring blood glucose levels as instructed. Reviewed s/sx of hypoglycemia and hyperglycemia along with appropriate interventions. Currently utilizing a [employer]. Reports reviewing the device at least 4 times a day. Reports fasting readings have ranged from 120 to 199. Adherence with a diabetic diet has increased. Expressed concerns that she will run out of sensors. Several sensors required removal d/t medical imaging during her last hospitalization. Reports being informed by her insurance provider that her sensors can not be refilled earlier. Will discuss with the Endocrinology team. Discussed need to use a standard glucometer if extra sensors will not be provided.  Discussed DM preventive care exams. Reports completing required exams as recommended. Reports not being followed by a Podiatrist but completes daily foot care as advised and completing foot exams during provider visits. Eye exam is up to date. She will complete the next eye exam in September.   Self-Care Activities/Patient Goals: -Self administer medications as prescribed -Monitor blood glucose levels as instructed and follow recommended interventions -Adhere to a diabetic/modified carb diet. -Complete Endocrinology visit as scheduled. -Contact the clinic or care management team with questions or new concerns as needed   Follow Up Plan:  Will follow up within the next month    Patient Care Plan: Hypertension and Hyperlipidemia     Problem  Identified: Hypertension and Hyperlipidemia      Long-Range Goal: Hypertension and Hyperlipidemia Monitored   Start Date: 07/25/2020  Expected End Date: 11/22/2020  Priority: Medium  Note:   Objective:  Last practice recorded BP readings:  BP Readings from Last 3 Encounters:  07/15/20 132/78  06/20/20 (!) 148/60  06/04/20 128/86   Most recent eGFR/CrCl: No results found for: EGFR  No components found for: CRCL  Lab Results  Component Value Date   CHOL 169 06/10/2020   HDL 41.10 06/10/2020   LDLCALC 93 06/10/2020   TRIG 172.0 (H) 06/10/2020   CHOLHDL 4 06/10/2020    Current Barriers:  Chronic Care Management support and educational needs related to Hypertension and Hyperlipidemia  Case Manager Clinical Goal(s):  Over the next 120 days, patient will demonstrate improved adherence to prescribed treatment plan as evidenced by taking all medications as prescribed, monitoring and recording blood pressure as directed and adhering to heart healthy/DASH diet.   Interventions:  Collaboration with Delsa Grana, [name] regarding development and update of comprehensive plan of care as evidenced by provider attestation and co-signature Inter-disciplinary care team collaboration (see longitudinal plan of care) Evaluation of current treatment plan related to hypertension and hyperlipidemia self management. Reviewed medications and importance of compliance. Reports taking as prescribed. Reports several cardiac medications will require refills within the next few weeks. Medication adjustments were made following her hospitalization. Reports her local pharmacy routed requests to the inpatient/attending providers instead of her primary care provider. She has notified [facility]. Agreed to update the team if additional assistance is needed. Provided verbal information regarding established blood pressure parameters and indications for notifying a provider. Advised to monitor blood pressure as instructed and  record readings.  Reviewed nutritional intake and adherence to a heart healthy/DASH diet. Advised to monitor sodium intake. Advised to read nutrition labels and avoid highly processed foods when possible. Discussed s/sx of heart attack, stroke and worsening symptoms that require immediate medical attention.    Self-Care Activities/Patient Goals Self administers medications as prescribed Attend all scheduled provider appointments Monitor BP and record readings Adhere to a heart healthy/DASH diet Call  provider office or care management team with new concerns or questions if needed    Follow Up Plan:  Will follow up within the next month    Patient Care Plan: COPD (Adult)     Problem Identified: Symptom Exacerbation (COPD)      Long-Range Goal: Symptom Exacerbation Prevented or Minimized   Start Date: 07/25/2020  Expected End Date: 11/22/2020  Priority: High  Note:   Current Barriers:  Chronic Disease Management support and educational needs related to COPD.  Case Manager Clinical Goal(s): Over the next 120 days, patient will not require emergent care or hospitalization for complications r/t COPD exacerbation.  Interventions:  Collaboration with Lucio Edward,  Kristeen Miss, [name] regarding development and update of comprehensive plan of care as evidenced by provider attestation and co-signature Inter-disciplinary care team collaboration (see longitudinal plan of care) Reviewed medications. Reports taking as prescribed. Reports symptoms have been very well controlled with prescribed inhalers and nebulizer. Denies concerns regarding medication management or prescription cost. Provided verbal COPD education regarding self care, management, and exacerbation prevention. Discussed COPD action plan and reinforced importance of daily self assessment. Discussed triggers and importance of avoiding when possible to prevent exacerbation of symptoms. Reports symptoms are usually worsened with extreme heat.  Reports taking needed measures to keep cool. Avoids outdoor activities during the heat of the day. Advised to schedule a provider appointment if symptoms persist for 48 hours without improvement. Discussed infection prevention strategies and measures to reduce risk of respiratory infection . Discussed worsening s/sx that require immediate medical attention   Self-Care Activities/Patient Goals: Self administer medications as prescribed Attend all scheduled provider appointments Follow recommended measures to prevent symptom exacerbation Follow up with the Pulmonology team as scheduled Contact the clinic or care management team with questions and new concerns as needed   Follow Up Plan:  Will follow up within the next month    Patient Care Plan: Fall Risk (Adult)     Problem Identified: Fall Risk      Long-Range Goal: Absence of Fall and Fall-Related Injury   Start Date: 07/25/2020  Expected End Date: 11/22/2020  Priority: High  Note:   Current Barriers:  High Risk for Falls r/t Impaired Gait  Clinical Goal(s):  Over the next 120 days, patient will not experience falls or require hospitalization d/t fall related injuries.  Interventions:  Collaboration with Delsa Grana, [name] regarding development and update of comprehensive plan of care as evidenced by provider attestation and co-signature Inter-disciplinary care team collaboration (see longitudinal plan of care) Reviewed medications and discussed potential side effects of medications such as dizziness, lightheadedness and frequent urination. Provided information regarding safety and fall prevention measures. Advised to use an assistive device with all ambulation. Reports having a Rollator walker and quad cane. Advised to avoid overreaching and attempting to lift weighted objects. Advised to avoid prolonged activity to prevent accidental falls r/t exertion. Discussed plan for Physical Therapy. Referral was submitted during her last  clinic appointment. Pending call back from [place]. Discussed ability to perform self care and tasks in the home. Reports completing self care independently. She requires assistance with tasks in the home and other IADL's. Reports her daughter lives in the home and is available to assist as needed. Denies current need for additional in-home assistance.   Self-Care Deficits/Patient Goals:  Utilize appropriate assistive device with all ambulation Ensure pathways are clear and well lit Use caution when ambulating and change positions slowly Wear secure fitting non skid footwear when ambulating   Follow Up Plan:  Will follow up within the next month      Ms. Haughton was given information about Chronic Care Management services including:  CCM service includes personalized support from designated clinical staff supervised by her physician, including individualized plan of care and coordination with other care providers 24/7 contact phone numbers for assistance for urgent and routine care needs. Service will only be billed when office clinical staff spend 20 minutes or more in a month to coordinate care. Only one practitioner may furnish and bill the service in a calendar month. The patient may stop CCM services at any time (effective at the end of the month) by phone  call to the office staff. The patient will be responsible for cost sharing (co-pay) of up to 20% of the service fee (after annual deductible is met).  Patient agreed to services and verbal consent obtained.     PLAN A member of the care management team will follow up with Ms. Engelbrecht next month.    [name] Care Management [facility] [phone]

## 2020-07-31 NOTE — Patient Instructions (Signed)
Thank you for allowing the Chronic Care Management team to participate in your care. It was a pleasure speaking with you. Please feel free to contact me with questions.  Goals Addressed: Patient Care Plan: Diabetes Type 2 (Adult)     Problem Identified: Glycemic Management (Diabetes, Type 2)      Long-Range Goal: Glycemic Management Optimized   Start Date: 07/25/2020  Expected End Date: 11/22/2020  Priority: High  Note:   Objective:  Lab Results  Component Value Date   HGBA1C 7.8 (H) 06/10/2020   Lab Results  Component Value Date   CREATININE 0.88 07/15/2020   CREATININE 0.89 06/18/2020   CREATININE 0.83 06/17/2020   No results found for: EGFR   Current Barriers:  Chronic Disease Management support and educational needs related to  Diabetes management.  Case Manager Clinical Goal(s):  Over the next 120 days, patient will demonstrate improved adherence to prescribed treatment plan for diabetes self care/management as evidenced by monitoring of CBG's as instructed, adherence to ADA/ carb modified diet and adherence to prescribed medication regimen.  Interventions:  Collaboration with Delsa Grana, [name] regarding development and update of comprehensive plan of care as evidenced by provider attestation and co-signature Inter-disciplinary care team collaboration (see longitudinal plan of care) Reviewed medications and discussed importance of medication adherence. Reports taking as prescribed. Insulin currently being administered via an Omnipod. Denies current concerns regarding medication management or prescription cost. Discussed importance of monitoring blood glucose levels as instructed. Reviewed s/sx of hypoglycemia and hyperglycemia along with appropriate interventions. Currently utilizing a [employer]. Reports reviewing the device at least 4 times a day. Reports fasting readings have ranged from 120 to 199. Adherence with a diabetic diet has increased. Expressed concerns  that she will run out of sensors. Several sensors required removal d/t medical imaging during her last hospitalization. Reports being informed by her insurance provider that her sensors can not be refilled earlier. Will discuss with the Endocrinology team. Discussed need to use a standard glucometer if extra sensors will not be provided. Discussed DM preventive care exams. Reports completing required exams as recommended. Reports not being followed by a Podiatrist but completes daily foot care as advised and completing foot exams during provider visits. Eye exam is up to date. She will complete the next eye exam in September.   Self-Care Activities/Patient Goals: -Self administer medications as prescribed -Monitor blood glucose levels as instructed and follow recommended interventions -Adhere to a diabetic/modified carb diet. -Complete Endocrinology visit as scheduled. -Contact the clinic or care management team with questions or new concerns as needed   Follow Up Plan:  Will follow up within the next month    Patient Care Plan: Hypertension and Hyperlipidemia     Problem Identified: Hypertension and Hyperlipidemia      Long-Range Goal: Hypertension and Hyperlipidemia Monitored   Start Date: 07/25/2020  Expected End Date: 11/22/2020  Priority: Medium  Note:   Objective:  Last practice recorded BP readings:  BP Readings from Last 3 Encounters:  07/15/20 132/78  06/20/20 (!) 148/60  06/04/20 128/86   Most recent eGFR/CrCl: No results found for: EGFR  No components found for: CRCL  Lab Results  Component Value Date   CHOL 169 06/10/2020   HDL 41.10 06/10/2020   LDLCALC 93 06/10/2020   TRIG 172.0 (H) 06/10/2020   CHOLHDL 4 06/10/2020    Current Barriers:  Chronic Care Management support and educational needs related to Hypertension and Hyperlipidemia  Case Manager Clinical Goal(s):  Over the next  120 days, patient will demonstrate improved adherence to prescribed treatment  plan as evidenced by taking all medications as prescribed, monitoring and recording blood pressure as directed and adhering to heart healthy/DASH diet.   Interventions:  Collaboration with Delsa Grana, [name] regarding development and update of comprehensive plan of care as evidenced by provider attestation and co-signature Inter-disciplinary care team collaboration (see longitudinal plan of care) Evaluation of current treatment plan related to hypertension and hyperlipidemia self management. Reviewed medications and importance of compliance. Reports taking as prescribed. Reports several cardiac medications will require refills within the next few weeks. Medication adjustments were made following her hospitalization. Reports her local pharmacy routed requests to the inpatient/attending providers instead of her primary care provider. She has notified [facility]. Agreed to update the team if additional assistance is needed. Provided verbal information regarding established blood pressure parameters and indications for notifying a provider. Advised to monitor blood pressure as instructed and record readings.  Reviewed nutritional intake and adherence to a heart healthy/DASH diet. Advised to monitor sodium intake. Advised to read nutrition labels and avoid highly processed foods when possible. Discussed s/sx of heart attack, stroke and worsening symptoms that require immediate medical attention.    Self-Care Activities/Patient Goals Self administers medications as prescribed Attend all scheduled provider appointments Monitor BP and record readings Adhere to a heart healthy/DASH diet Call  provider office or care management team with new concerns or questions if needed    Follow Up Plan:  Will follow up within the next month    Patient Care Plan: COPD (Adult)     Problem Identified: Symptom Exacerbation (COPD)      Long-Range Goal: Symptom Exacerbation Prevented or Minimized   Start Date:  07/25/2020  Expected End Date: 11/22/2020  Priority: High  Note:   Current Barriers:  Chronic Disease Management support and educational needs related to COPD.  Case Manager Clinical Goal(s): Over the next 120 days, patient will not require emergent care or hospitalization for complications r/t COPD exacerbation.  Interventions:  Collaboration with Delsa Grana, [name] regarding development and update of comprehensive plan of care as evidenced by provider attestation and co-signature Inter-disciplinary care team collaboration (see longitudinal plan of care) Reviewed medications. Reports taking as prescribed. Reports symptoms have been very well controlled with prescribed inhalers and nebulizer. Denies concerns regarding medication management or prescription cost. Provided verbal COPD education regarding self care, management, and exacerbation prevention. Discussed COPD action plan and reinforced importance of daily self assessment. Discussed triggers and importance of avoiding when possible to prevent exacerbation of symptoms. Reports symptoms are usually worsened with extreme heat. Reports taking needed measures to keep cool. Avoids outdoor activities during the heat of the day. Advised to schedule a provider appointment if symptoms persist for 48 hours without improvement. Discussed infection prevention strategies and measures to reduce risk of respiratory infection . Discussed worsening s/sx that require immediate medical attention   Self-Care Activities/Patient Goals: Self administer medications as prescribed Attend all scheduled provider appointments Follow recommended measures to prevent symptom exacerbation Follow up with the Pulmonology team as scheduled Contact the clinic or care management team with questions and new concerns as needed   Follow Up Plan:  Will follow up within the next month    Patient Care Plan: Fall Risk (Adult)     Problem Identified: Fall Risk       Long-Range Goal: Absence of Fall and Fall-Related Injury   Start Date: 07/25/2020  Expected End Date: 11/22/2020  Priority: High  Note:  Current Barriers:  High Risk for Falls r/t Impaired Gait  Clinical Goal(s):  Over the next 120 days, patient will not experience falls or require hospitalization d/t fall related injuries.  Interventions:  Collaboration with Delsa Grana, [name] regarding development and update of comprehensive plan of care as evidenced by provider attestation and co-signature Inter-disciplinary care team collaboration (see longitudinal plan of care) Reviewed medications and discussed potential side effects of medications such as dizziness, lightheadedness and frequent urination. Provided information regarding safety and fall prevention measures. Advised to use an assistive device with all ambulation. Reports having a Rollator walker and quad cane. Advised to avoid overreaching and attempting to lift weighted objects. Advised to avoid prolonged activity to prevent accidental falls r/t exertion. Discussed plan for Physical Therapy. Referral was submitted during her last clinic appointment. Pending call back from [place]. Discussed ability to perform self care and tasks in the home. Reports completing self care independently. She requires assistance with tasks in the home and other IADL's. Reports her daughter lives in the home and is available to assist as needed. Denies current need for additional in-home assistance.   Self-Care Deficits/Patient Goals:  Utilize appropriate assistive device with all ambulation Ensure pathways are clear and well lit Use caution when ambulating and change positions slowly Wear secure fitting non skid footwear when ambulating   Follow Up Plan:  Will follow up within the next month      Ms. Doerner was given information about Chronic Care Management services including:  CCM service includes personalized support from designated  clinical staff supervised by her physician, including individualized plan of care and coordination with other care providers 24/7 contact phone numbers for assistance for urgent and routine care needs. Service will only be billed when office clinical staff spend 20 minutes or more in a month to coordinate care. Only one practitioner may furnish and bill the service in a calendar month. The patient may stop CCM services at any time (effective at the end of the month) by phone call to the office staff. The patient will be responsible for cost sharing (co-pay) of up to 20% of the service fee (after annual deductible is met).  Patient agreed to services and verbal consent obtained.      Ms. Beggs verbalized understanding of the information discussed during the telephonic outreach. Declined need for mailed/printed instructions. A member of the care management team will follow up next month.    [name] Care Management [facility] [phone]

## 2020-08-01 ENCOUNTER — Other Ambulatory Visit: Payer: Self-pay

## 2020-08-01 ENCOUNTER — Ambulatory Visit: Payer: Self-pay

## 2020-08-01 DIAGNOSIS — I10 Essential (primary) hypertension: Secondary | ICD-10-CM

## 2020-08-01 DIAGNOSIS — Z9181 History of falling: Secondary | ICD-10-CM

## 2020-08-01 DIAGNOSIS — E782 Mixed hyperlipidemia: Secondary | ICD-10-CM

## 2020-08-01 NOTE — Chronic Care Management (AMB) (Signed)
Chronic Care Management   CCM RN Visit Note  08/01/2020 Name: [name] MRN: [id number] DOB: 1965-02-04  Subjective: [name] is a 55 y.o. year old female who is a primary care patient of Delsa Grana, [place]. The care management team was consulted for assistance with disease management and care coordination needs.    Engaged with patient by telephone for follow up visit in response to provider referral for case management and/or care coordination services.   Consent to Services:  The patient was given information about Chronic Care Management services, agreed to services, and gave verbal consent prior to initiation of services.  Please see initial visit note for detailed documentation.   Patient agreed to services and verbal consent obtained.   Assessment: Review of patient past medical history, allergies, medications, health status, including review of consultants reports, laboratory and other test data, was performed as part of comprehensive evaluation and provision of chronic care management services.   SDOH (Social Determinants of Health) assessments and interventions performed:    CCM Care Plan  Allergies  Allergen Reactions   Codeine Swelling and Other (See Comments)    Swelling and burning of mouth (inside)   Lactose Intolerance (Gi) Nausea And Vomiting    Outpatient Encounter Medications as of 08/01/2020  Medication Sig Note   albuterol (ACCUNEB) 1.25 MG/3ML nebulizer solution Inhale 1 ampule into the lungs every 6 (six) hours as needed for wheezing or shortness of breath.    albuterol (PROAIR HFA) 108 (90 Base) MCG/ACT inhaler INHALE 2 PUFFS BY MOUTH EVERY 6 HOURS AS NEEDED FOR WHEEZING OR SHORTNESS OF BREATH (Patient taking differently: Inhale 2 puffs into the lungs every 6 (six) hours as needed for wheezing or shortness of breath.)    amLODipine (NORVASC) 10 MG tablet Take 1 tablet (10 mg total) by mouth daily.    baclofen (LIORESAL) 10 MG tablet  Take 1 tablet (10 mg total) by mouth 3 (three) times daily.    budesonide (PULMICORT) 0.5 MG/2ML nebulizer solution Inhale 0.5 mg into the lungs daily.    carvedilol (COREG) 12.5 MG tablet Take 12.5 mg by mouth 2 (two) times daily with a meal.    cholecalciferol (VITAMIN D3) 25 MCG (1000 UNIT) tablet Take 1,000 Units by mouth daily.    clopidogrel (PLAVIX) 75 MG tablet Take 1 tablet (75 mg total) by mouth daily.    cycloSPORINE (RESTASIS) 0.05 % ophthalmic emulsion Place 2 drops into both eyes 2 (two) times daily. 07/25/2020: Reports speaking with local pharmacy. Pending receipt.   dapagliflozin propanediol (FARXIGA) 10 MG TABS tablet Take 1 tablet (10 mg total) by mouth daily before breakfast.    Dulaglutide (TRULICITY) 3 UM/3.5TI SOPN Inject 3 mg into the skin once a week. (Patient taking differently: Inject 3 mg into the skin every Friday.)    DULoxetine (CYMBALTA) 20 MG capsule TAKE 2 CAPSULES EVERY MORNING AND 1 CAPSULE EVERY NIGHT.    EPINEPHrine 0.3 mg/0.3 mL IJ SOAJ injection Inject 0.3 mg into the muscle as needed for anaphylaxis.    ezetimibe (ZETIA) 10 MG tablet Take 1 tablet (10 mg total) by mouth daily.    fluticasone (FLONASE) 50 MCG/ACT nasal spray Place 2 sprays into both nostrils daily.    gabapentin (NEURONTIN) 600 MG tablet 600 mg in the morning, 600 mg in the afternoon, 1200 mg nightly. (Patient taking differently: Take 600-1,200 mg by mouth See admin instructions. Take 1 tablet (611m) by mouth every morning and afternoon and then take 2 tablets (12021m  by mouth every night)    glucose blood (ACCU-CHEK GUIDE) test strip Use as instructed to check blood sugar 4 times per day Dx code E11.9    hydrALAZINE (APRESOLINE) 25 MG tablet Take 1 tablet (25 mg total) by mouth every 8 (eight) hours.    hydrochlorothiazide (HYDRODIURIL) 25 MG tablet Take 1 tablet (25 mg total) by mouth daily.    insulin lispro (HUMALOG) 100 UNIT/ML injection USE UP TO 150 UNITS/DAY IN INSULIN PUMP     levETIRAcetam (KEPPRA) 500 MG tablet Take 500-750 mg by mouth See admin instructions. Take 1 tablet (549m) by mouth every morning and take 1 tablets (7568m by mouth every night    losartan (COZAAR) 100 MG tablet Take 1 tablet (100 mg total) by mouth daily.    mirabegron ER (MYRBETRIQ) 50 MG TB24 tablet Take 1 tablet (50 mg total) by mouth daily.    nitroGLYCERIN (NITROSTAT) 0.4 MG SL tablet Place 0.4 mg under the tongue every 5 (five) minutes as needed for chest pain.    pantoprazole (PROTONIX) 40 MG tablet Take 1 tablet (40 mg total) by mouth daily.    polyethylene glycol (MIRALAX / GLYCOLAX) 17 g packet Take 17 g by mouth daily as needed for mild constipation.    potassium chloride SA (KLOR-CON M20) 20 MEQ tablet You have not needed potassium while in the hospital.  Hold until outpatient followup. (Patient taking differently: Take 20 mEq by mouth 2 (two) times daily.)    simvastatin (ZOCOR) 20 MG tablet Take 20 mg by mouth daily.    spironolactone (ALDACTONE) 25 MG tablet Take 25 mg by mouth daily.    terconazole (TERAZOL 7) 0.4 % vaginal cream Place 1 applicator vaginally at bedtime as needed (irritation).    WIXELA INHUB 250-50 MCG/DOSE AEPB Inhale 1 puff into the lungs 2 (two) times daily.    No facility-administered encounter medications on file as of 08/01/2020.    Patient Active Problem List   Diagnosis Date Noted   Weakness of right arm    SBO (small bowel obstruction) (HCC)    Acute cystitis with hematuria    Right sided weakness    Adrenal adenoma, right    Nausea & vomiting 06/13/2020   Ileus (HCS[place]05/26/2022   PAD (peripheral artery disease) (HCAngwin01/05/2020   Leg pain 01/24/2020   Leg pain, bilateral 01/11/2020   Numbness and tingling 01/11/2020   Abnormal ankle brachial index (ABI) 12/18/2019   Hypoglycemia 05/17/2019   Hypoglycemia associated with type 2 diabetes mellitus (HCMcCutchenville04/27/2021   Chest pain 04/27/2019   Type 2 diabetes mellitus without complication, with  long-term current use of insulin (HCLake [place]04/08/2019   COPD (chronic obstructive pulmonary disease) (HCPle[place]04/08/2019   CAD (coronary artery disease) 04/27/2019   H/O: stroke with residual effects    Myalgia 04/19/2019   Central pain syndrome 04/19/2019   Non-intractable vomiting 03/01/2019   History of CVA (cerebrovascular accident) 07/08/2018   History of seizure 07/08/2018   Diabetic polyneuropathy associated with type 2 diabetes mellitus (HCNellie02/12/2018   Obesity (BMI 30-39.9) 03/02/2018   Vitamin D deficiency 01/24/2018   Vulvovaginal candidiasis 01/24/2018   Hypokalemia 06/08/2017   Overactive bladder 05/18/2017   Asthma 06/22/2016   Dilated aortic root (HCMonroe   Sacroiliac dysfunction 10/09/2014   Chronic low back pain 09/03/2014   CVA, old, hemiparesis (HCSilver Lake08/15/2016   OSA (obstructive sleep apnea) 05/15/2014   Restrictive airway disease 05/15/2014   Colitis 02/22/2014   Type 2 diabetes mellitus with  hyperlipidemia ([place]) 01/21/2014   Chronic pain syndrome 01/21/2014   Headache disorder 09/07/2013   Diabetic neuropathy ([place]) 05/23/2013   Mixed urge and stress incontinence 08/25/2011   Ureteral stricture, right 08/25/2011   Pyelonephritis 08/22/2011   Nephrolithiasis 08/22/2011   Incisional hernia 05/10/2011   Class 3 severe obesity with serious comorbidity and body mass index (BMI) of 40.0 to 44.9 in adult ([place]) 05/10/2011   Tachycardia 05/08/2011   BLINDNESS, LEGAL, [place] DEFINITION 09/14/2006   Hyperlipemia 09/13/2006   Accelerated hypertension 09/13/2006   MYOCARDIAL INFARCTION, HX OF 09/13/2006   GERD 09/13/2006   Seizure disorder ([place]) 09/13/2006    Conditions to be addressed/monitored:HTN, HLD, and Fall Risk/Physical Therapy services  Patient Care Plan: Hypertension and Hyperlipidemia     Problem Identified: Hypertension and Hyperlipidemia      Long-Range Goal: Hypertension and Hyperlipidemia Monitored   Start Date: 07/25/2020  Expected End Date: 11/22/2020   Priority: Medium  Objective:  Last practice recorded BP readings:  BP Readings from Last 3 Encounters:  07/15/20 132/78  06/20/20 (!) 148/60  06/04/20 128/86   Most recent eGFR/CrCl: No results found for: EGFR  No components found for: CRCL  Lab Results  Component Value Date   CHOL 169 06/10/2020   HDL 41.10 06/10/2020   LDLCALC 93 06/10/2020   TRIG 172.0 (H) 06/10/2020   CHOLHDL 4 06/10/2020    Current Barriers:  Chronic Care Management support and educational needs related to Hypertension and Hyperlipidemia  Case Manager Clinical Goal(s):  Over the next 120 days, patient will demonstrate improved adherence to prescribed treatment plan as evidenced by taking all medications as prescribed, monitoring and recording blood pressure as directed and adhering to heart healthy/DASH diet.   Interventions:  Collaboration with Delsa Grana, [name] regarding development and update of comprehensive plan of care as evidenced by provider attestation and co-signature Inter-disciplinary care team collaboration (see longitudinal plan of care) Evaluation of current treatment plan related to hypertension and hyperlipidemia self management. Reviewed medications and importance of compliance. Reports taking as prescribed. Reports several cardiac medications will require refills within the next few weeks. Medication adjustments were made following her hospitalization. Reports her local pharmacy routed requests to the inpatient/attending providers instead of her primary care provider. [facility] notified [facility]. Agreed to update the team if additional assistance is needed. Provided verbal information regarding established blood pressure parameters and indications for notifying a provider. Advised to monitor blood pressure as instructed and record readings.  Reviewed nutritional intake and adherence to a heart healthy/DASH diet. Advised to monitor sodium intake. Advised to read nutrition labels and avoid highly  processed foods when possible. Discussed s/sx of heart attack, stroke and worsening symptoms that require immediate medical attention.  Update 08/01/20: [place] regarding needed refills. Advised to fax refill request to patient's PCP, Delsa Grana.   Self-Care Activities/Patient Goals Self administers medications as prescribed Attend all scheduled provider appointments Monitor BP and record readings Adhere to a heart healthy/DASH diet Call  provider office or care management team with new concerns or questions if needed    Follow Up Plan:  Will follow up within the next month     Patient Care Plan: Fall Risk (Adult)     Problem Identified: Fall Risk      Long-Range Goal: Absence of Fall and Fall-Related Injury   Start Date: 07/25/2020  Expected End Date: 11/22/2020  Priority: High  Current Barriers:  High Risk for Falls r/t Impaired Gait  Clinical Goal(s):  Over the next  120 days, patient will not experience falls or require hospitalization d/t fall related injuries.  Interventions:  Collaboration with Delsa Grana, [name] regarding development and update of comprehensive plan of care as evidenced by provider attestation and co-signature Inter-disciplinary care team collaboration (see longitudinal plan of care) Reviewed medications and discussed potential side effects of medications such as dizziness, lightheadedness and frequent urination. Provided information regarding safety and fall prevention measures. Advised to use an assistive device with all ambulation. Reports having a Rollator walker and quad cane. Advised to avoid overreaching and attempting to lift weighted objects. Advised to avoid prolonged activity to prevent accidental falls r/t exertion. Discussed plan for Physical Therapy. Referral was submitted during her last clinic appointment. Pending call back from [place]. Discussed ability to perform self care and tasks in the home. Reports completing  self care independently. She requires assistance with tasks in the home and other IADL's. Reports her daughter lives in the home and is available to assist as needed. Denies current need for additional in-home assistance. Update 08/01/20: Ms. Feigenbaum reports home health physical therapy has started. Referral was placed during her previous clinic visit. Contacted HH regarding status of referral and to determine if additional clinical documentation was required. Pending call back.   Self-Care Deficits/Patient Goals:  Utilize appropriate assistive device with all ambulation Ensure pathways are clear and well lit Use caution when ambulating and change positions slowly Wear secure fitting non skid footwear when ambulating   Follow Up Plan:  Will follow up within the next month      PLAN A member of the care management team will follow up with Ms. Reif within the next month.    [name] Care Management [facility] [phone]

## 2020-08-05 ENCOUNTER — Telehealth: Payer: Self-pay

## 2020-08-05 ENCOUNTER — Ambulatory Visit: Payer: Self-pay

## 2020-08-05 ENCOUNTER — Inpatient Hospital Stay: Payer: Medicare Other | Admitting: Surgery

## 2020-08-05 DIAGNOSIS — E782 Mixed hyperlipidemia: Secondary | ICD-10-CM | POA: Diagnosis not present

## 2020-08-05 DIAGNOSIS — I10 Essential (primary) hypertension: Secondary | ICD-10-CM

## 2020-08-05 DIAGNOSIS — J449 Chronic obstructive pulmonary disease, unspecified: Secondary | ICD-10-CM | POA: Diagnosis not present

## 2020-08-05 DIAGNOSIS — I25119 Atherosclerotic heart disease of native coronary artery with unspecified angina pectoris: Secondary | ICD-10-CM

## 2020-08-05 DIAGNOSIS — E785 Hyperlipidemia, unspecified: Secondary | ICD-10-CM | POA: Diagnosis not present

## 2020-08-05 DIAGNOSIS — Z9181 History of falling: Secondary | ICD-10-CM

## 2020-08-05 DIAGNOSIS — E1169 Type 2 diabetes mellitus with other specified complication: Secondary | ICD-10-CM

## 2020-08-05 MED ORDER — HYDROCHLOROTHIAZIDE 25 MG PO TABS: 25.0000 mg | ORAL_TABLET | Freq: Every day | ORAL | 2 refills | Status: DC

## 2020-08-05 MED ORDER — HYDRALAZINE HCL 25 MG PO TABS: 25.0000 mg | ORAL_TABLET | Freq: Three times a day (TID) | ORAL | 2 refills | Status: DC

## 2020-08-05 NOTE — Telephone Encounter (Signed)
[facility] had some follow up questions regarding the pts home health orders, please call back.

## 2020-08-05 NOTE — Telephone Encounter (Signed)
Left vm to sara, will try again tomorrow

## 2020-08-05 NOTE — Telephone Encounter (Signed)
Copied from [place] [phone]. Topic: General - Other >> Aug 05, 2020  1:18 PM Tessa Lerner A wrote: Reason for CRM: Patient would like to be contacted regarding the status of their referral - referral # [id number]  Patient has spoken with [facility] and beem unable to further coordinate their personal care and home health services  Please contact to further advise when possible

## 2020-08-06 NOTE — Telephone Encounter (Signed)
Where was her home health referral sent?  Can you please send to [facility]?

## 2020-08-08 ENCOUNTER — Other Ambulatory Visit: Payer: Self-pay

## 2020-08-08 MED ORDER — HYDROCHLOROTHIAZIDE 25 MG PO TABS: 25.0000 mg | ORAL_TABLET | Freq: Every day | ORAL | 2 refills | Status: DC

## 2020-08-08 MED ORDER — HYDRALAZINE HCL 25 MG PO TABS
25.0000 mg | ORAL_TABLET | Freq: Three times a day (TID) | ORAL | 2 refills | Status: DC
Start: 2020-08-08 — End: 2021-03-20

## 2020-08-08 MED ORDER — AMLODIPINE BESYLATE 10 MG PO TABS: 10.0000 mg | ORAL_TABLET | Freq: Every day | ORAL | 2 refills | Status: DC

## 2020-08-09 NOTE — Chronic Care Management (AMB) (Signed)
Chronic Care Management   CCM RN Visit Note   Name: [name] MRN: [id number] DOB: [birth year]  Subjective: [name] is a 55 y.o. year old female who is a primary care patient of Delsa Grana, [place]. The care management team was consulted for assistance with disease management and care coordination needs.    Engaged with patient by telephone for follow up visit in response to provider referral for case management and/or care coordination services.   Consent to Services:  The patient was given information about Chronic Care Management services, agreed to services, and gave verbal consent prior to initiation of services.  Please see initial visit note for detailed documentation.   Assessment: Review of patient past medical history, allergies, medications, health status, including review of consultants reports, laboratory and other test data, was performed as part of comprehensive evaluation and provision of chronic care management services.   SDOH (Social Determinants of Health) assessments and interventions performed:  No  CCM Care Plan  Allergies  Allergen Reactions   Codeine Swelling and Other (See Comments)    Swelling and burning of mouth (inside)   Lactose Intolerance (Gi) Nausea And Vomiting    Outpatient Encounter Medications as of 08/05/2020  Medication Sig Note   albuterol (ACCUNEB) 1.25 MG/3ML nebulizer solution Inhale 1 ampule into the lungs every 6 (six) hours as needed for wheezing or shortness of breath.    albuterol (PROAIR HFA) 108 (90 Base) MCG/ACT inhaler INHALE 2 PUFFS BY MOUTH EVERY 6 HOURS AS NEEDED FOR WHEEZING OR SHORTNESS OF BREATH (Patient taking differently: Inhale 2 puffs into the lungs every 6 (six) hours as needed for wheezing or shortness of breath.)    baclofen (LIORESAL) 10 MG tablet Take 1 tablet (10 mg total) by mouth 3 (three) times daily.    budesonide (PULMICORT) 0.5 MG/2ML nebulizer solution Inhale 0.5 mg into the lungs daily.     carvedilol (COREG) 12.5 MG tablet Take 12.5 mg by mouth 2 (two) times daily with a meal.    cholecalciferol (VITAMIN D3) 25 MCG (1000 UNIT) tablet Take 1,000 Units by mouth daily.    clopidogrel (PLAVIX) 75 MG tablet Take 1 tablet (75 mg total) by mouth daily.    cycloSPORINE (RESTASIS) 0.05 % ophthalmic emulsion Place 2 drops into both eyes 2 (two) times daily. 07/25/2020: Reports speaking with local pharmacy. Pending receipt.   dapagliflozin propanediol (FARXIGA) 10 MG TABS tablet Take 1 tablet (10 mg total) by mouth daily before breakfast.    Dulaglutide (TRULICITY) 3 QQ/5.9DG SOPN Inject 3 mg into the skin once a week. (Patient taking differently: Inject 3 mg into the skin every Friday.)    DULoxetine (CYMBALTA) 20 MG capsule TAKE 2 CAPSULES EVERY MORNING AND 1 CAPSULE EVERY NIGHT.    EPINEPHrine 0.3 mg/0.3 mL IJ SOAJ injection Inject 0.3 mg into the muscle as needed for anaphylaxis.    ezetimibe (ZETIA) 10 MG tablet Take 1 tablet (10 mg total) by mouth daily.    fluticasone (FLONASE) 50 MCG/ACT nasal spray Place 2 sprays into both nostrils daily.    gabapentin (NEURONTIN) 600 MG tablet 600 mg in the morning, 600 mg in the afternoon, 1200 mg nightly. (Patient taking differently: Take 600-1,200 mg by mouth See admin instructions. Take 1 tablet (678m) by mouth every morning and afternoon and then take 2 tablets (12082m by mouth every night)    glucose blood (ACCU-CHEK GUIDE) test strip Use as instructed to check blood sugar 4 times per day Dx code E11.9  insulin lispro (HUMALOG) 100 UNIT/ML injection USE UP TO 150 UNITS/DAY IN INSULIN PUMP    levETIRAcetam (KEPPRA) 500 MG tablet Take 500-750 mg by mouth See admin instructions. Take 1 tablet (534m) by mouth every morning and take 1 tablets (7558m by mouth every night    losartan (COZAAR) 100 MG tablet Take 1 tablet (100 mg total) by mouth daily.    mirabegron ER (MYRBETRIQ) 50 MG TB24 tablet Take 1 tablet (50 mg total) by mouth daily.     nitroGLYCERIN (NITROSTAT) 0.4 MG SL tablet Place 0.4 mg under the tongue every 5 (five) minutes as needed for chest pain.    pantoprazole (PROTONIX) 40 MG tablet Take 1 tablet (40 mg total) by mouth daily.    polyethylene glycol (MIRALAX / GLYCOLAX) 17 g packet Take 17 g by mouth daily as needed for mild constipation.    potassium chloride SA (KLOR-CON M20) 20 MEQ tablet You have not needed potassium while in the hospital.  Hold until outpatient followup. (Patient taking differently: Take 20 mEq by mouth 2 (two) times daily.)    simvastatin (ZOCOR) 20 MG tablet Take 20 mg by mouth daily.    spironolactone (ALDACTONE) 25 MG tablet Take 25 mg by mouth daily.    terconazole (TERAZOL 7) 0.4 % vaginal cream Place 1 applicator vaginally at bedtime as needed (irritation).    WIXELA INHUB 250-50 MCG/DOSE AEPB Inhale 1 puff into the lungs 2 (two) times daily.    [DISCONTINUED] amLODipine (NORVASC) 10 MG tablet Take 1 tablet (10 mg total) by mouth daily.    [DISCONTINUED] hydrALAZINE (APRESOLINE) 25 MG tablet Take 1 tablet (25 mg total) by mouth every 8 (eight) hours.    [DISCONTINUED] hydrochlorothiazide (HYDRODIURIL) 25 MG tablet Take 1 tablet (25 mg total) by mouth daily.    No facility-administered encounter medications on file as of 08/05/2020.    Patient Active Problem List   Diagnosis Date Noted   Weakness of right arm    SBO (small bowel obstruction) (HCC)    Acute cystitis with hematuria    Right sided weakness    Adrenal adenoma, right    Nausea & vomiting 06/13/2020   Ileus (HCGreilickville05/26/2022   PAD (peripheral artery disease) (HCSmith[place]01/05/2020   Leg pain 01/24/2020   Leg pain, bilateral 01/11/2020   Numbness and tingling 01/11/2020   Abnormal ankle brachial index (ABI) 12/18/2019   Hypoglycemia 05/17/2019   Hypoglycemia associated with type 2 diabetes mellitus (HCMooresburg04/27/2021   Chest pain 04/27/2019   Type 2 diabetes mellitus without complication, with long-term current use of insulin  (HCLucas Valley[place]Marin[place]04/08/2019   COPD (chronic obstructive pulmonary disease) (HCCass04/08/2019   CAD (coronary artery disease) 04/27/2019   H/O: stroke with residual effects    Myalgia 04/19/2019   Central pain syndrome 04/19/2019   Non-intractable vomiting 03/01/2019   History of CVA (cerebrovascular accident) 07/08/2018   History of seizure 07/08/2018   Diabetic polyneuropathy associated with type 2 diabetes mellitus (HCSalem02/12/2018   Obesity (BMI 30-39.9) 03/02/2018   Vitamin D deficiency 01/24/2018   Vulvovaginal candidiasis 01/24/2018   Hypokalemia 06/08/2017   Overactive bladder 05/18/2017   Asthma 06/22/2016   Dilated aortic root (HCNipinnawasee   Sacroiliac dysfunction 10/09/2014   Chronic low back pain 09/03/2014   CVA, old, hemiparesis (HCNorth Plainfield08/15/2016   OSA (obstructive sleep apnea) 05/15/2014   Restrictive airway disease 05/15/2014   Colitis 02/22/2014   Type 2 diabetes mellitus with hyperlipidemia (HCSt. Cloud01/03/2014   Chronic pain syndrome 01/21/2014  Headache disorder 09/07/2013   Diabetic neuropathy ([place]) 05/23/2013   Mixed urge and stress incontinence 08/25/2011   Ureteral stricture, right 08/25/2011   Pyelonephritis 08/22/2011   Nephrolithiasis 08/22/2011   Incisional hernia 05/10/2011   Class 3 severe obesity with serious comorbidity and body mass index (BMI) of 40.0 to 44.9 in adult ([place]) 05/10/2011   Tachycardia 05/08/2011   BLINDNESS, LEGAL, [place] DEFINITION 09/14/2006   Hyperlipemia 09/13/2006   Accelerated hypertension 09/13/2006   MYOCARDIAL INFARCTION, HX OF 09/13/2006   GERD 09/13/2006   Seizure disorder ([place]) 09/13/2006    Conditions to be addressed/monitored:HTN, HLD, and Fall Risks  Patient Care Plan: Hypertension and Hyperlipidemia     Problem Identified: Hypertension and Hyperlipidemia      Long-Range Goal: Hypertension and Hyperlipidemia Monitored   Start Date: 07/25/2020  Expected End Date: 11/22/2020  Priority: Medium  Note:   Objective:  Last practice  recorded BP readings:  BP Readings from Last 3 Encounters:  07/15/20 132/78  06/20/20 (!) 148/60  06/04/20 128/86   Most recent eGFR/CrCl: No results found for: EGFR  No components found for: CRCL  Lab Results  Component Value Date   CHOL 169 06/10/2020   HDL 41.10 06/10/2020   LDLCALC 93 06/10/2020   TRIG 172.0 (H) 06/10/2020   CHOLHDL 4 06/10/2020    Current Barriers:  Chronic Care Management support and educational needs related to Hypertension and Hyperlipidemia  Case Manager Clinical Goal(s):  Over the next 120 days, patient will demonstrate improved adherence to prescribed treatment plan as evidenced by taking all medications as prescribed, monitoring and recording blood pressure as directed and adhering to heart healthy/DASH diet.   Interventions:  Collaboration with Delsa Grana, [name] regarding development and update of comprehensive plan of care as evidenced by provider attestation and co-signature Inter-disciplinary care team collaboration (see longitudinal plan of care) Evaluation of current treatment plan related to hypertension and hyperlipidemia self management. Reviewed medications and importance of compliance. Reports taking as prescribed. Reports several cardiac medications will require refills within the next few weeks. Medication adjustments were made following her hospitalization. Reports her local pharmacy routed requests to the inpatient/attending providers instead of her primary care provider. She has notified [facility]. Agreed to update the team if additional assistance is needed. Provided verbal information regarding established blood pressure parameters and indications for notifying a provider. Advised to monitor blood pressure as instructed and record readings.  Reviewed nutritional intake and adherence to a heart healthy/DASH diet. Advised to monitor sodium intake. Advised to read nutrition labels and avoid highly processed foods when possible. Discussed s/sx of  heart attack, stroke and worsening symptoms that require immediate medical attention.  Update 08/05/2020: Received update from [place]. Ms. Hsia will require new orders for amlodipine, hydralazine and hydrochlorothiazide. Per pharmacy technician, the current order was submitted by an inpatient physician. New orders are required if her provider prefers that she remain on the current medications and doses. Message forwarded to PCP.   Self-Care Activities/Patient Goals Self administers medications as prescribed Contact staff if unable to obtain needed refills Attend all scheduled provider appointments Monitor BP and record readings Adhere to a heart healthy/DASH diet Call  provider office or care management team with new concerns or questions if needed    Follow Up Plan:  Will follow up within the next month     Patient Care Plan: Fall Risk (Adult)     Problem Identified: Fall Risk      Long-Range Goal: Absence of Fall and  Fall-Related Injury   Start Date: 07/25/2020  Expected End Date: 11/22/2020  Priority: High  Note:   Current Barriers:  High Risk for Falls r/t Impaired Gait  Clinical Goal(s):  Over the next 120 days, patient will not experience falls or require hospitalization d/t fall related injuries.  Interventions:  Collaboration with Delsa Grana, [name] regarding development and update of comprehensive plan of care as evidenced by provider attestation and co-signature Inter-disciplinary care team collaboration (see longitudinal plan of care) Reviewed medications and discussed potential side effects of medications such as dizziness, lightheadedness and frequent urination. Provided information regarding safety and fall prevention measures. Advised to use an assistive device with all ambulation. Reports having a Rollator walker and quad cane. Advised to avoid overreaching and attempting to lift weighted objects. Advised to avoid prolonged activity to prevent  accidental falls r/t exertion. Discussed plan for Physical Therapy. Referral was submitted during her last clinic appointment. Pending call back from [place]. Discussed ability to perform self care and tasks in the home. Reports completing self care independently. She requires assistance with tasks in the home and other IADL's. Reports her daughter lives in the home and is available to assist as needed. Denies current need for additional in-home assistance. Update 08/05/20: Ms. Macdowell reports receiving a message from staff at [facility]. Pending start of physical therapy services. Advised to contact clinic or update the care management team if services are not initiated d/t agency staffing. Will submit new referral if needed.    Self-Care Deficits/Patient Goals:  Utilize appropriate assistive device with all ambulation Ensure pathways are clear and well lit Use caution when ambulating and change positions slowly Wear secure fitting non skid footwear when ambulating   Follow Up Plan:  Will follow up within the next month       PLAN A member of the care management team will follow up with Ms. Espejo within the next month.   [name] Care Management [facility] [phone]

## 2020-08-12 ENCOUNTER — Other Ambulatory Visit: Payer: Self-pay | Admitting: Family Medicine

## 2020-08-12 ENCOUNTER — Other Ambulatory Visit: Payer: Self-pay

## 2020-08-12 ENCOUNTER — Encounter: Payer: Self-pay | Admitting: Family Medicine

## 2020-08-12 ENCOUNTER — Encounter: Payer: Self-pay | Admitting: Surgery

## 2020-08-12 ENCOUNTER — Ambulatory Visit (INDEPENDENT_AMBULATORY_CARE_PROVIDER_SITE_OTHER): Payer: Medicare Other | Admitting: Surgery

## 2020-08-12 VITALS — BP 165/107 | HR 78 | Temp 98.6°F | Ht 66.0 in | Wt 252.0 lb

## 2020-08-12 DIAGNOSIS — G8929 Other chronic pain: Secondary | ICD-10-CM

## 2020-08-12 DIAGNOSIS — M545 Low back pain, unspecified: Secondary | ICD-10-CM

## 2020-08-12 DIAGNOSIS — D3501 Benign neoplasm of right adrenal gland: Secondary | ICD-10-CM | POA: Diagnosis not present

## 2020-08-12 DIAGNOSIS — G894 Chronic pain syndrome: Secondary | ICD-10-CM

## 2020-08-12 DIAGNOSIS — R140 Abdominal distension (gaseous): Secondary | ICD-10-CM

## 2020-08-12 MED ORDER — BACLOFEN 10 MG PO TABS: 10.0000 mg | ORAL_TABLET | Freq: Three times a day (TID) | ORAL | 0 refills | Status: DC | PRN

## 2020-08-12 NOTE — Patient Instructions (Addendum)
We want you to have some laboratory work done. You may go to [facility] [place] entrance anytime to have this done.   We will have you also get a CT scan done.   You are scheduled for a CT scan on August 12th at 10 am at [facility]. You will need to arrive there by 8:30 am. You will need to have nothing to eat or drink for 4 hours prior.   We will have you follow up here after we get all these results back.

## 2020-08-12 NOTE — Progress Notes (Signed)
Outpatient Surgical Follow Up  08/12/2020  [name] is an 55 y.o. female.   Chief Complaint  Patient presents with   Hospitalization Follow-up    HPI: [name] is a 55 year old female following up after recent hospitalization for partial bowel obstruction.  She responded to medical therapy.  She comes in with persistent chronic abdominal pain that is intermittent colicky type and located in the left upper quadrant.  There is no specific alleviating or relieving factors.  No nausea no vomiting no fevers.  She does endorse constipation improved with MiraLAX. Reviewed the last CT scan personally showing evidence of some dilated loops of small bowel.  There is evidence of small right adrenal adenoma.  No transition point  She does have history of COPD diabetes and hypertension with recurrent UTIs as well.   Past Medical History:  Diagnosis Date   Anxiety and depression 09/13/2006   Qualifier: Diagnosis of  By: Hassell Done [name], [place]     Arthritis    joint pain    Asthma    COPD (chronic obstructive pulmonary disease) ([place])    chronic bronchitis    Depression    Diabetes mellitus    since age 38; type 2 IDDM   Diabetic neuropathy, painful ([place])    FEET AND HANDS   Diabetic retinopathy ([place])    Diastolic dysfunction    a.  Echo 11/17: EF 60-65%, mild LVH, no RWMA, Gr1DD, mild AI, dilated aortic root measuring 38 mm, mildly dilated ascending aorta   Dilated aortic root (HCC)    57m by echo 11/2015   Dizziness    secondary to diabetes and hypertension    Epilepsy idiopathic petit mal (HHarris    last seizure 2012;controlled w/ topomax   GERD (gastroesophageal reflux disease)    Glaucoma    NOT ON ANY EYE DROPS    Headache(784.0)    migraines    Heart murmur    born with    History of stress test    a. 11/17: Normal perfusion, EF 53%, normal study   Hx of blood clots    hematomas removed from left side of brain from 148moto [age]yrld    Hyperlipidemia     Hypertension    Legally blind    left eye    MIGRAINE HEADACHE 09/14/2006   Qualifier: Diagnosis of  By: MarHassell DoneP, Nykedtra     [facility]  [facility].  Patient reported history of without objective documentation   Nephrolithiasis    frequent urination , urination at nite  PT SEEN IN ER 09/22/11 FOR BACK AND RT SIDED PAIN--HAS KNOWN STONE RT URETER AND UA IN ER SHOWED UTI   Pain 09/23/11   LOWER BACK AND RIGHT SIDE--PT HAS RIGHT URETERAL STONE   Pneumonia    hx of 2009   Pyelonephritis    Seizures (HCCIron [place]  Sleep ap[facility]    [facility] ; mild   Stress incontinence    Stroke (HCCUnity  last 2003  RESIDUAL LEFT LEG WEAKNESS--NO OTHER RESIDUAL PROBLEMS    Past Surgical History:  Procedure Laterality Date   BRAIN HEMATOMA EVACUATION     five procedures total, first procedure when 10 33 monthsd, last at 4 y61ars of age.   CYSTOSCOPY W/ RETROGRADES  09/24/2011   Procedure: CYSTOSCOPY WITH RETROGRADE PYELOGRAM;  Surgeon: JohMalka SoD;  [facility];  Service: Urology;  Laterality: Right;   CYSTOSCOPY  WITH URETEROSCOPY  09/24/2011   Procedure: CYSTOSCOPY WITH URETEROSCOPY;  Surgeon: Malka So, MD;  [facility];  Service: Urology;  Laterality: Right;  Balloon dilation right ureter    CYSTOSCOPY/RETROGRADE/URETEROSCOPY  08/24/2011   Procedure: CYSTOSCOPY/RETROGRADE/URETEROSCOPY;  Surgeon: Molli Hazard, MD;  [facility];  Service: Urology;  Laterality: Right;  Cysto, Right retrograde Pyelogram, right stent placement.    INCISIONAL HERNIA REPAIR  05/05/2011   Procedure: LAPAROSCOPIC INCISIONAL HERNIA;  Surgeon: Rolm Bookbinder, MD;  Location: [place];  Service: General;  Laterality: N/A;   kidney stone removal     MASS EXCISION  05/05/2011   Procedure: EXCISION MASS;  Surgeon: Rolm Bookbinder, MD;  Location: [place];  Service: General;  Laterality: Right;   PCNL     RETINAL DETACHMENT SURGERY Left 1990   RIGHT/LEFT HEART CATH AND  CORONARY ANGIOGRAPHY N/A 04/28/2019   Procedure: RIGHT/LEFT HEART CATH AND CORONARY ANGIOGRAPHY;  Surgeon: Yolonda Kida, MD;  Location: [place] [facility];  Service: Cardiovascular;  Laterality: N/A;   SHUNT REMOVAL     shunt inserted at age 71 removed at age 37    [place]    Family History  Problem Relation Age of Onset   Uterine cancer Mother    Hypertension Mother    Cancer Mother    Brain cancer Maternal Grandmother    Hypertension Maternal Grandmother    Birth defects Daughter    Hypertension Daughter    Cirrhosis Maternal Grandfather    ADD / ADHD Maternal Grandfather    Birth defects Maternal Grandfather    Diabetes Maternal Grandfather    Anesthesia problems Neg Hx    Colon cancer Neg Hx    Esophageal cancer Neg Hx    Pancreatic cancer Neg Hx     Social History:  reports that she quit smoking about 33 years ago. Her smoking use included cigarettes. She has a 15.00 pack-year smoking history. She has never used smokeless tobacco. She reports current alcohol use of about 1.0 standard drink of alcohol per week. She reports that she does not use drugs.  Allergies:  Allergies  Allergen Reactions   Codeine Swelling and Other (See Comments)    Swelling and burning of mouth (inside)   Lactose Intolerance (Gi) Nausea And Vomiting    Medications reviewed.    ROS Full ROS performed and is otherwise negative other than what is stated in HPI   BP (!) 165/107   Pulse 78   Temp 98.6 F (37 C)   Ht 5' 6"  (1.676 m)   Wt 252 lb (114.3 kg)   LMP  (LMP Unknown)   SpO2 97%   BMI 40.67 kg/m   Physical Exam Vitals and nursing note reviewed. Exam conducted with a chaperone present.  Constitutional:      Appearance: Normal appearance. She is obese.  Pulmonary:     Effort: Pulmonary effort is normal. No respiratory distress.     Breath sounds: No stridor.  Abdominal:     General: There is no distension.     Palpations: Abdomen is soft. There is no  mass.     Tenderness: There is abdominal tenderness. There is no guarding or rebound.  Musculoskeletal:        General: No swelling or tenderness. Normal range of motion.  Skin:    General: Skin is warm and dry.     Capillary Refill: Capillary refill takes less than 2 seconds.  Neurological:     General:  No focal deficit present.     Mental Status: She is alert and oriented to person, place, and time.  Psychiatric:        Mood and Affect: Mood normal.        Behavior: Behavior normal.        Thought Content: Thought content normal.        Judgment: Judgment normal.       Assessment/Plan: 55 year old female with chronic intermittent abdominal pain and history of partial small bowel obstruction.  She continues to have intermittent pain, like to further interrogate the potential etiology with a CT enterography.  I would like to also recheck basic lab work today.  She does have an adrenal adenoma on the right side that will eventually need functional work-up.  At this time I am more concerned about the potential acute intra-abdominal pathology.  She does not look toxic and does not need surgical intervention at this time  Greater than 50% of the 59mnutes  visit was spent in counseling/coordination of care   DCaroleen Hamman MD FGreenfieldSurgeon

## 2020-08-13 ENCOUNTER — Ambulatory Visit: Payer: Medicare Other | Admitting: Endocrinology

## 2020-08-15 ENCOUNTER — Encounter: Payer: Self-pay | Admitting: Endocrinology

## 2020-08-15 ENCOUNTER — Other Ambulatory Visit: Payer: Self-pay

## 2020-08-15 ENCOUNTER — Ambulatory Visit (INDEPENDENT_AMBULATORY_CARE_PROVIDER_SITE_OTHER): Payer: Medicare Other | Admitting: Endocrinology

## 2020-08-15 VITALS — BP 148/90 | HR 87 | Ht 66.0 in | Wt 252.6 lb

## 2020-08-15 DIAGNOSIS — I10 Essential (primary) hypertension: Secondary | ICD-10-CM

## 2020-08-15 DIAGNOSIS — Z794 Long term (current) use of insulin: Secondary | ICD-10-CM

## 2020-08-15 DIAGNOSIS — E1165 Type 2 diabetes mellitus with hyperglycemia: Secondary | ICD-10-CM

## 2020-08-15 NOTE — Progress Notes (Signed)
Patient ID: [name], female   DOB: [birth year], 55 y.o.   MRN: [id number]          Reason for Appointment: Follow-up for Type 2 Diabetes   History of Present Illness:          Date of diagnosis of type 2 diabetes mellitus:   1989      Background history:   She thinks she was initially treated with diet alone and subsequently given metformin With metformin she had nausea and diarrhea but she does not know if she was treated with any other medications until she was started on insulin in 2003.  Also was on glipizide in 2015 Most likely she was on Levemir or Lantus insulin for quite some time She was then switched to Toujeo in 2016 and although she was initially prescribed 90 units twice a day this was subsequently lowered  Her A1c has been occasionally between 7.2 and 7.5 but has been consistently over 9% since 2018  INSULIN regimen previously: 80 U Toujeo hs, Novolog 20-25 units before meals based on blood sugar     Recent history:     OMNIPOD PUMP settings: Carbohydrate coverage 1:1 ratio,   Basal settings: 12:00 AM 2.1 Units/hr 6:00 PM = 2.25 Units/hr  correction 1: 40 and target 120-140  Non-insulin hypoglycemic drugs the patient is taking are: Trulicity 3.0 mg weekly, Farxiga 10 mg daily  Her A1c is last 7.8, previous range 8.9-12.6  Current management, blood sugar patterns and problems identified: She has used the freestyle libre but has not been able to get a consistent prescription lately  Although she is checking her blood sugars more frequently on her Accu-Chek these are still done erratically  Her main difficulty is not bolusing consistently  Although previously had been explained the need for bolus with every meal she is still afraid of bolusing when the blood sugar is normal  This is mostly causing high readings over 200  FASTING readings are variable but sometimes near normal  Difficult to assess the adequacy of her boluses with current data  For some  reason her insulin pump has 4 different basal rates programmed, she does not think she has been using different settings No hypoglycemia documented Has been taking Trulicity regularly and no nausea with 3 mg dose  Current blood sugar patterns from her meter Blood sugars are mostly higher between 3-6 AM but periodically only higher in the late afternoon and evenings   Side effects from medications have been:metformin caused GI side effects  Glucose monitoring:  done 3+ times a day              Blood Glucose readings recently by time of day and 14-day averages from pump download    PRE-MEAL Mornings Midday Evening Overnight Overall  Glucose range:     106-405  Mean/median: 180 208 203 210 199   Previous data:  PRE-MEAL Fasting Lunch Dinner Bedtime Overall  Glucose range:  160-173  196-245  161-240  186, 247   Mean/median:   210  180   186     Meal times are: Bfst 7 AM, 1-2 PM and 5-6 PM                Dietician visit, most recent: 9/18 CDE visit: 01/2020 for pump start  Weight history: Previous range 219-230  Wt Readings from Last 3 Encounters:  08/15/20 252 lb 9.6 oz (114.6 kg)  08/12/20 252 lb (114.3 kg)  07/15/20 257 lb  11.2 oz (116.9 kg)    Glycemic control:    Lab Results  Component Value Date   HGBA1C 7.8 (H) 06/10/2020   HGBA1C 9.1 (H) 01/30/2020   HGBA1C 9.4 (A) 09/14/2019   Lab Results  Component Value Date   MICROALBUR 20.3 (H) 09/21/2019   LDLCALC 93 06/10/2020   CREATININE 0.88 07/15/2020   Lab Results  Component Value Date   MICRALBCREAT 21.8 09/21/2019    Lab Results  Component Value Date   FRUCTOSAMINE 255 04/09/2020   FRUCTOSAMINE 298 (H) 01/30/2020   FRUCTOSAMINE CANCELED 07/26/2015      Allergies as of 08/15/2020       Reactions   Codeine Swelling, Other (See Comments)   Swelling and burning of mouth (inside)   Lactose Intolerance (gi) Nausea And Vomiting        Medication List        Accurate as of August 15, 2020 12:04  PM. If you have any questions, ask your nurse or doctor.          Accu-Chek Guide test strip Generic drug: glucose blood Use as instructed to check blood sugar 4 times per day Dx code E11.9   albuterol 1.25 MG/3ML nebulizer solution Commonly known as: ACCUNEB Inhale 1 ampule into the lungs every 6 (six) hours as needed for wheezing or shortness of breath. What changed: Another medication with the same name was changed. Make sure you understand how and when to take each.   albuterol 108 (90 Base) MCG/ACT inhaler Commonly known as: ProAir HFA INHALE 2 PUFFS BY MOUTH EVERY 6 HOURS AS NEEDED FOR WHEEZING OR SHORTNESS OF BREATH What changed:  how much to take how to take this when to take this reasons to take this additional instructions   amLODipine 10 MG tablet Commonly known as: NORVASC Take 1 tablet (10 mg total) by mouth daily.   baclofen 10 MG tablet Commonly known as: LIORESAL Take 1 tablet (10 mg total) by mouth 3 (three) times daily as needed for muscle spasms.   budesonide 0.5 MG/2ML nebulizer solution Commonly known as: PULMICORT Inhale 0.5 mg into the lungs daily.   carvedilol 12.5 MG tablet Commonly known as: COREG Take 12.5 mg by mouth 2 (two) times daily with a meal.   cholecalciferol 25 MCG (1000 UNIT) tablet Commonly known as: VITAMIN D3 Take 1,000 Units by mouth daily.   clopidogrel 75 MG tablet Commonly known as: PLAVIX Take 1 tablet (75 mg total) by mouth daily.   cycloSPORINE 0.05 % ophthalmic emulsion Commonly known as: RESTASIS Place 2 drops into both eyes 2 (two) times daily.   dapagliflozin propanediol 10 MG Tabs tablet Commonly known as: Farxiga Take 1 tablet (10 mg total) by mouth daily before breakfast.   DULoxetine 20 MG capsule Commonly known as: CYMBALTA TAKE 2 CAPSULES EVERY MORNING AND 1 CAPSULE EVERY NIGHT.   EPINEPHrine 0.3 mg/0.3 mL Soaj injection Commonly known as: EPI-PEN Inject 0.3 mg into the muscle as needed for  anaphylaxis.   ezetimibe 10 MG tablet Commonly known as: Zetia Take 1 tablet (10 mg total) by mouth daily.   fluticasone 50 MCG/ACT nasal spray Commonly known as: FLONASE Place 2 sprays into both nostrils daily.   gabapentin 600 MG tablet Commonly known as: Neurontin 600 mg in the morning, 600 mg in the afternoon, 1200 mg nightly. What changed:  how much to take how to take this when to take this additional instructions   hydrALAZINE 25 MG tablet Commonly known as: APRESOLINE  Take 1 tablet (25 mg total) by mouth every 8 (eight) hours.   hydrochlorothiazide 25 MG tablet Commonly known as: HYDRODIURIL Take 1 tablet (25 mg total) by mouth daily.   insulin lispro 100 UNIT/ML injection Commonly known as: HumaLOG USE UP TO 150 UNITS/DAY IN INSULIN PUMP   levETIRAcetam 500 MG tablet Commonly known as: KEPPRA Take 500-750 mg by mouth See admin instructions. Take 1 tablet (532m) by mouth every morning and take 1 tablets (7540m by mouth every night   losartan 100 MG tablet Commonly known as: COZAAR Take 1 tablet (100 mg total) by mouth daily.   mirabegron ER 50 MG Tb24 tablet Commonly known as: Myrbetriq Take 1 tablet (50 mg total) by mouth daily.   nitroGLYCERIN 0.4 MG SL tablet Commonly known as: NITROSTAT Place 0.4 mg under the tongue every 5 (five) minutes as needed for chest pain.   pantoprazole 40 MG tablet Commonly known as: PROTONIX Take 1 tablet (40 mg total) by mouth daily.   polyethylene glycol 17 g packet Commonly known as: MIRALAX / GLYCOLAX Take 17 g by mouth daily as needed for mild constipation.   potassium chloride SA 20 MEQ tablet Commonly known as: Klor-Con M20 You have not needed potassium while in the hospital.  Hold until outpatient followup. What changed:  how much to take how to take this when to take this additional instructions   simvastatin 20 MG tablet Commonly known as: ZOCOR Take 20 mg by mouth daily.   spironolactone 25 MG  tablet Commonly known as: ALDACTONE Take 25 mg by mouth daily.   terconazole 0.4 % vaginal cream Commonly known as: TERAZOL 7 Place 1 applicator vaginally at bedtime as needed (irritation).   Trulicity 3 MGYH/0.6CBopn Generic drug: Dulaglutide Inject 3 mg into the skin once a week. What changed: when to take this   Wixela Inhub 250-50 MCG/ACT Aepb Generic drug: fluticasone-salmeterol Inhale 1 puff into the lungs 2 (two) times daily.        Allergies:  Allergies  Allergen Reactions   Codeine Swelling and Other (See Comments)    Swelling and burning of mouth (inside)   Lactose Intolerance (Gi) Nausea And Vomiting    Past Medical History:  Diagnosis Date   Anxiety and depression 09/13/2006   Qualifier: Diagnosis of  By: MaHassell DoneNP, Nykedtra     Arthritis    joint pain    Asthma    COPD (chronic obstructive pulmonary disease) ([facility]   chronic bronchitis    Depression    Diabetes mellitus    since age [age]type 2 IDDM   Diabetic neuropathy, painful ([facility]   FEET AND HANDS   Diabetic retinopathy ([facility]   Diastolic dysfunction    a.  Echo 11/17: EF 60-65%, mild LVH, no RWMA, Gr1DD, mild AI, dilated aortic root measuring 38 mm, mildly dilated ascending aorta   Dilated aortic root (HCC)    3875my echo 11/2015   Dizziness    secondary to diabetes and hypertension    Epilepsy idiopathic petit mal ([facility]  last seizure 2012;controlled w/ topomax   GERD (gastroesophageal reflux disease)    Glaucoma    NOT ON ANY EYE DROPS    Headache(784.0)    migraines    Heart murmur    born with    History of stress test    a. 11/17: Normal perfusion, EF 53%, normal study   Hx of blood clots    hematomas removed from left  side of brain from 49mo to 89yrold    Hyperlipidemia    Hypertension    Legally blind    left eye    MIGRAINE HEADACHE 09/14/2006   Qualifier: Diagnosis of  By: MaHassell DoneNP, Nykedtra     [facility]   a.  Patient reported history of without  objective documentation   Nephrolithiasis    frequent urination , urination at nite  PT SEEN IN ER 09/22/11 FOR BACK AND RT SIDED PAIN--HAS KNOWN STONE RT URETER AND UA IN ER SHOWED UTI   Pain 09/23/11   LOWER BACK AND RIGHT SIDE--PT HAS RIGHT URETERAL STONE   Pneumonia    hx of 2009   Pyelonephritis    Seizures ([facility]   [facility] ap[facility]    [facility] ; mild   Stress incontinence    Stroke ([facility]   last 2003  RESIDUAL LEFT LEG WEAKNESS--NO OTHER RESIDUAL PROBLEMS    Past Surgical History:  Procedure Laterality Date   BRAIN HEMATOMA EVACUATION     five procedures total, first procedure when [age] monthsld, last at [age]ears of age.   CYSTOSCOPY W/ RETROGRADES  09/24/2011   Procedure: CYSTOSCOPY WITH RETROGRADE PYELOGRAM;  Surgeon: JoMalka SoMD;  [facility];  Service: Urology;  Laterality: Right;   CYSTOSCOPY WITH URETEROSCOPY  09/24/2011   Procedure: CYSTOSCOPY WITH URETEROSCOPY;  Surgeon: JoMalka SoMD;  Location: [facility];  Service: Urology;  Laterality: Right;  Balloon dilation right ureter    CYSTOSCOPY/RETROGRADE/URETEROSCOPY  08/24/2011   Procedure: CYSTOSCOPY/RETROGRADE/URETEROSCOPY;  Surgeon: DaMolli HazardMD;  Location: [facility];  Service: Urology;  Laterality: Right;  Cysto, Right retrograde Pyelogram, right stent placement.    INCISIONAL HERNIA REPAIR  05/05/2011   Procedure: LAPAROSCOPIC INCISIONAL HERNIA;  Surgeon: MaRolm BookbinderMD;  [facility]: [facility] Service: General;  Laterality: N/A;   kidney stone removal     MASS EXCISION  05/05/2011   Procedure: EXCISION MASS;  Surgeon: MaRolm BookbinderMD;  [facility] Service: General;  Laterality: Right;   PC[facility]     RETINAL DETACHMENT SURGERY Left 1990   RIGHT/LEFT HEART CATH AND CORONARY ANGIOGRAPHY N/A 04/28/2019   Procedure: RIGHT/LEFT HEART CATH AND CORONARY ANGIOGRAPHY;  Surgeon: CaYolonda KidaMD;  Location: [facility];  Service: Cardiovascular;  Laterality:  N/A;   SHUNT REMOVAL     shunt inserted at age 6 50emoved at age 55  [place]  Family History  Problem Relation Age of Onset   Uterine cancer Mother    Hypertension Mother    Cancer Mother    Brain cancer Maternal Grandmother    Hypertension Maternal Grandmother    Birth defects Daughter    Hypertension Daughter    Cirrhosis Maternal Grandfather    ADD / ADHD Maternal Grandfather    Birth defects Maternal Grandfather    Diabetes Maternal Grandfather    Anesthesia problems Neg Hx    Colon cancer Neg Hx    Esophageal cancer Neg Hx    Pancreatic cancer Neg Hx     Social History:  reports that she quit smoking about 33 years ago. Her smoking use included cigarettes. She has a 15.00 pack-year smoking history. She has never used smokeless tobacco. She reports current alcohol use of about 1.0 standard drink of alcohol per week. She reports that she does not use drugs.   Review  of Systems   Lipid history: Is being treated with simvastatin 40 mg daily    Lab Results  Component Value Date   CHOL 169 06/10/2020   HDL 41.10 06/10/2020   LDLCALC 93 06/10/2020   TRIG 172.0 (H) 06/10/2020   CHOLHDL 4 06/10/2020           Hypertension: Taking Coreg, Losartan 100 mg per cardiologist  Home BP ?  BP Readings from Last 3 Encounters:  08/15/20 (!) 148/90  08/12/20 (!) 165/107  07/15/20 132/78   Hypokalemia: She has been given 40 mEq of potassium along with 25 mg spironolactone for treatment by PCP  Lab Results  Component Value Date   K 4.4 07/15/2020    Most recent eye exam was in 8/21  Most recent foot exam: 6/14  Diabetic complications: History of neuropathy, microalbuminuria and background retinopathy  She will occasionally take prednisone for her asthma  LABS:  No visits with results within 1 Week(s) from this visit.  Latest known visit with results is:  Office Visit on 07/15/2020  Component Date Value Ref Range Status   Glucose, Bld  07/15/2020 233 (A) 65 - 99 mg/dL Final   Comment: .            Fasting reference interval . For someone without known diabetes, a glucose value >125 mg/dL indicates that they may have diabetes and this should be confirmed with a follow-up test. .    BUN 07/15/2020 17  7 - 25 mg/dL Final   Creat 07/15/2020 0.88  0.50 - 1.05 mg/dL Final   Comment: For patients >32 years of age, the reference limit for Creatinine is approximately 13% higher for people identified as African-American. .    BUN/Creatinine Ratio 43/15/4008 NOT APPLICABLE  6 - 22 (calc) Final   Sodium 07/15/2020 141  135 - 146 mmol/L Final   Potassium 07/15/2020 4.4  3.5 - 5.3 mmol/L Final   Chloride 07/15/2020 104  98 - 110 mmol/L Final   CO2 07/15/2020 30  20 - 32 mmol/L Final   Calcium 07/15/2020 9.0  8.6 - 10.4 mg/dL Final    Physical Examination:  BP (!) 148/90   Pulse 87   Ht 5' 6"  (1.676 m)   Wt 252 lb 9.6 oz (114.6 kg)   LMP  (LMP Unknown)   SpO2 94%   BMI 40.77 kg/m       ASSESSMENT:  Diabetes type 2, uncontrolled, insulin-dependent  See history of present illness for detailed discussion of current diabetes management, blood sugar patterns and problems identified     Her A1c is last 7.8, previously 9.1  She is on insulin pump since 1/22 using Humalog with Dash OmniPod pump Also on Farxiga and Trulicity  Her blood sugars were assessed from the Accu-Chek and she is not able to get her freestyle Elenor Legato recently  Although her blood sugars are averaging about 200 at home most of her readings are likely high postprandially but not consistently assessed Does not appear to have high readings at breakfast time consistently although some readings during the night are high  Again her main difficulty is with not bolusing at mealtimes when her blood sugars are normal  Currently using less than 50 units a day, may be benefiting from [place] and Trulicity   PLAN:   Again explained the need for bolusing  consistently with every meal regardless of Premeal blood sugar to control postprandial high sugars which are consistently occurring She is trying to enter carbohydrates and  she will try to do that more often She will try to get started back on the freestyle libre, new prescription has been sent Since her blood sugars are relatively higher in the afternoon she will increase her basal rate up to 2.25 at noon instead of 6 PM We will need to review her freestyle libre patterns and adjust settings further Continue same doses of Trulicity and [place]  Have deleted the multiple basal rates that are in her pump that she is not using  A1c on the next visit  She will check blood pressure regularly at home and follow-up with her cardiologist if not controlled  Patient Instructions  Bolus 5-15 min before each meal regardless of sugar level  Only use basal 4    Nichola Warren 08/15/2020, 12:04 PM   Note: This office note was prepared with Dragon voice recognition system technology. Any transcriptional errors that result from this process are unintentional.

## 2020-08-15 NOTE — Patient Instructions (Signed)
Bolus 5-15 min before each meal regardless of sugar level  Only use basal 4

## 2020-08-20 ENCOUNTER — Ambulatory Visit (INDEPENDENT_AMBULATORY_CARE_PROVIDER_SITE_OTHER): Payer: Medicare Other | Admitting: Family Medicine

## 2020-08-20 ENCOUNTER — Other Ambulatory Visit: Payer: Self-pay

## 2020-08-20 ENCOUNTER — Encounter: Payer: Self-pay | Admitting: Family Medicine

## 2020-08-20 VITALS — BP 134/80 | HR 98 | Temp 98.3°F | Resp 16 | Ht 66.0 in | Wt 252.9 lb

## 2020-08-20 DIAGNOSIS — I69359 Hemiplegia and hemiparesis following cerebral infarction affecting unspecified side: Secondary | ICD-10-CM

## 2020-08-20 DIAGNOSIS — Z87898 Personal history of other specified conditions: Secondary | ICD-10-CM

## 2020-08-20 DIAGNOSIS — N3281 Overactive bladder: Secondary | ICD-10-CM

## 2020-08-20 DIAGNOSIS — G4733 Obstructive sleep apnea (adult) (pediatric): Secondary | ICD-10-CM | POA: Diagnosis not present

## 2020-08-20 DIAGNOSIS — G894 Chronic pain syndrome: Secondary | ICD-10-CM | POA: Diagnosis not present

## 2020-08-20 DIAGNOSIS — G40909 Epilepsy, unspecified, not intractable, without status epilepticus: Secondary | ICD-10-CM

## 2020-08-20 DIAGNOSIS — Z6841 Body Mass Index (BMI) 40.0 and over, adult: Secondary | ICD-10-CM

## 2020-08-20 MED ORDER — MIRABEGRON ER 50 MG PO TB24: 50.0000 mg | ORAL_TABLET | Freq: Every day | ORAL | 1 refills | Status: DC

## 2020-08-20 NOTE — Assessment & Plan Note (Signed)
Doing well on keppra. Continue to follow with neuro.

## 2020-08-20 NOTE — Assessment & Plan Note (Deleted)
Continue to follow with neuro.

## 2020-08-20 NOTE — Assessment & Plan Note (Signed)
Contributing to chronic pain as well. Recommend weight loss through diet and activity as able.

## 2020-08-20 NOTE — Assessment & Plan Note (Signed)
With residual L sided weakness and spasticity. Recommend PT, referral previously generated. Continue to work with Cards, Endo to manage BP, diabetes. On zetia for cholesterol control.

## 2020-08-20 NOTE — Assessment & Plan Note (Signed)
Follows with urology. Refill provided.

## 2020-08-20 NOTE — Assessment & Plan Note (Signed)
Compliant with CPAP, encouraged continued use.

## 2020-08-20 NOTE — Assessment & Plan Note (Signed)
>>  ASSESSMENT AND PLAN FOR SEIZURE (HCC) WRITTEN ON 08/20/2020 11:53 AM BY RUMBALL, ALISON M, DO  Doing well on keppra . Continue to follow with neuro.

## 2020-08-20 NOTE — Assessment & Plan Note (Signed)
Back and leg pain with spasticity since prior stroke, also with diabetic neuropathy. Continue gabapentin, baclofen. Cautioned on taking baclofen only prn given concern for respiratory depression and h/o COPD, OSA. Continue to follow with Pain clinic.

## 2020-08-20 NOTE — Progress Notes (Signed)
    SUBJECTIVE:   CHIEF COMPLAINT / HPI:   Diabetes, Type 2 - Last A1c 7.8 - Medications: farxiga 42HC, trulicity 81m, humalog with pump - follows with Endo  Emphysema/COPD - Medications: albuterol prn, pulmicort - Compliance: using albuterol periodically - Exacerbations in last 6 months: no - follows with Pulm  H/o CVA - with residual L sided weakness, spasticity.  seizure d/o - on keppra, follows with neuro. Last seizure 2013.  Chronic Pain - chronic low back pain - muscle spasms in back and legs - ongoing for years - occurred after stroke - previously saw PT, hasn't heard about HHPT. - on gabapentin, baclofen   OBJECTIVE:   BP 134/80   Pulse 98   Temp 98.3 F (36.8 C) (Oral)   Resp 16   Ht 5' 6"  (1.676 m)   Wt 252 lb 14.4 oz (114.7 kg)   LMP  (LMP Unknown)   SpO2 98%   BMI 40.82 kg/m   Gen: overweight, in NAD MSK: 3/5 strength in LLE, 5/5 in RLE. Ambulates with walker.no appreciable spasticity on exam today.   ASSESSMENT/PLAN:   OSA (obstructive sleep apnea) Compliant with CPAP, encouraged continued use.  CVA, old, hemiparesis With residual L sided weakness and spasticity. Recommend PT, referral previously generated. Continue to work with Cards, Endo to manage BP, diabetes. On zetia for cholesterol control.  Chronic pain syndrome Back and leg pain with spasticity since prior stroke, also with diabetic neuropathy. Continue gabapentin, baclofen. Cautioned on taking baclofen only prn given concern for respiratory depression and h/o COPD, OSA. Continue to follow with Pain clinic.  Class 3 severe obesity with serious comorbidity and body mass index (BMI) of 40.0 to 44.9 in adult ([facility] Contributing to chronic pain as well. Recommend weight loss through diet and activity as able.   Overactive bladder Follows with urology. Refill provided.  [place] ([place] Doing well on keppra. Continue to follow with neuro.    AMyles Gip DO

## 2020-08-26 ENCOUNTER — Other Ambulatory Visit
Admission: RE | Admit: 2020-08-26 | Discharge: 2020-08-26 | Disposition: A | Discharge status: Home / Self Care | Payer: Medicare Other | Source: Ambulatory Visit | Attending: Surgery | Admitting: Surgery

## 2020-08-26 DIAGNOSIS — R140 Abdominal distension (gaseous): Secondary | ICD-10-CM | POA: Insufficient documentation

## 2020-08-26 DIAGNOSIS — D3501 Benign neoplasm of right adrenal gland: Secondary | ICD-10-CM | POA: Diagnosis present

## 2020-08-26 LAB — COMPREHENSIVE METABOLIC PANEL
ALT: 15 U/L (ref 0–44)
AST: 14 U/L — ABNORMAL LOW (ref 15–41)
Albumin: 3.9 g/dL (ref 3.5–5.0)
Alkaline Phosphatase: 71 U/L (ref 38–126)
Anion gap: 7 (ref 5–15)
BUN: 17 mg/dL (ref 6–20)
CO2: 26 mmol/L (ref 22–32)
Calcium: 9.3 mg/dL (ref 8.9–10.3)
Chloride: 106 mmol/L (ref 98–111)
Creatinine, Ser: 0.84 mg/dL (ref 0.44–1.00)
GFR, Estimated: >60 mL/min (ref >60)
Glucose, Bld: 145 mg/dL — ABNORMAL HIGH (ref 70–99)
Potassium: 3.8 mmol/L (ref 3.5–5.1)
Sodium: 139 mmol/L (ref 135–145)
Total Bilirubin: 0.4 mg/dL (ref 0.3–1.2)
Total Protein: 7.7 g/dL (ref 6.5–8.1)

## 2020-08-26 LAB — CBC WITH DIFFERENTIAL/PLATELET
Abs Immature Granulocytes: 0.03 10*3/uL (ref 0.00–0.07)
Basophils Absolute: 0 10*3/uL (ref 0.0–0.1)
Basophils Relative: 0 %
Eosinophils Absolute: 0 10*3/uL (ref 0.0–0.5)
Eosinophils Relative: 1 %
HCT: 40.2 % (ref 36.0–46.0)
Hemoglobin: 13 g/dL (ref 12.0–15.0)
Immature Granulocytes: 0 %
Lymphocytes Relative: 25 %
Lymphs Abs: 1.9 10*3/uL (ref 0.7–4.0)
MCH: 29.8 pg (ref 26.0–34.0)
MCHC: 32.3 g/dL (ref 30.0–36.0)
MCV: 92.2 fL (ref 80.0–100.0)
Monocytes Absolute: 0.6 10*3/uL (ref 0.1–1.0)
Monocytes Relative: 8 %
Neutro Abs: 4.8 10*3/uL (ref 1.7–7.7)
Neutrophils Relative %: 66 %
Platelets: 249 10*3/uL (ref 150–400)
RBC: 4.36 MIL/uL (ref 3.87–5.11)
RDW: 13.9 % (ref 11.5–15.5)
WBC: 7.3 10*3/uL (ref 4.0–10.5)
nRBC: 0 % (ref 0.0–0.2)

## 2020-08-26 NOTE — Telephone Encounter (Signed)
Jeanine with [facility] member services (states she has no ph# ) called to request an alternative RX for Omnipod be sent asap due to [facility] is unable to fill the Omnipod RX early (please see prior notes below-) [place]  was not sure Call transferred to [employer]

## 2020-08-26 NOTE — Telephone Encounter (Signed)
Spoke with Noreene Larsson from [facility] informed her that I spoke with patient this morning and she stated that [facility] was overriding her to get Rx early so there was nothing we needed to do. Noreene Larsson states she will call patient to see whats going on.

## 2020-08-26 NOTE — Patient Instructions (Signed)
Thank you for allowing the Chronic Care Management team to participate in your care.   Goals Addressed: Patient Care Plan: Hypertension and Hyperlipidemia     Problem Identified: Hypertension and Hyperlipidemia      Long-Range Goal: Hypertension and Hyperlipidemia Monitored   Start Date: 07/25/2020  Expected End Date: 11/22/2020  Priority: Medium  Objective:  Last practice recorded BP readings:  BP Readings from Last 3 Encounters:  07/15/20 132/78  06/20/20 (!) 148/60  06/04/20 128/86   Most recent eGFR/CrCl: No results found for: EGFR  No components found for: CRCL  Lab Results  Component Value Date   CHOL 169 06/10/2020   HDL 41.10 06/10/2020   LDLCALC 93 06/10/2020   TRIG 172.0 (H) 06/10/2020   CHOLHDL 4 06/10/2020    Current Barriers:  Chronic Care Management support and educational needs related to Hypertension and Hyperlipidemia  Case Manager Clinical Goal(s):  Over the next 120 days, patient will demonstrate improved adherence to prescribed treatment plan as evidenced by taking all medications as prescribed, monitoring and recording blood pressure as directed and adhering to heart healthy/DASH diet.   Interventions:  Collaboration with Delsa Grana, [name] regarding development and update of comprehensive plan of care as evidenced by provider attestation and co-signature Inter-disciplinary care team collaboration (see longitudinal plan of care) Evaluation of current treatment plan related to hypertension and hyperlipidemia self management. Reviewed medications and importance of compliance. Reports taking as prescribed. Reports several cardiac medications will require refills within the next few weeks. Medication adjustments were made following her hospitalization. Reports her local pharmacy routed requests to the inpatient/attending providers instead of her primary care provider. She has notified [facility]. Agreed to update the team if additional assistance is needed. Provided  verbal information regarding established blood pressure parameters and indications for notifying a provider. Advised to monitor blood pressure as instructed and record readings.  Reviewed nutritional intake and adherence to a heart healthy/DASH diet. Advised to monitor sodium intake. Advised to read nutrition labels and avoid highly processed foods when possible. Discussed s/sx of heart attack, stroke and worsening symptoms that require immediate medical attention.  Update 08/01/20: [place] regarding needed refills. Advised to fax refill request to patient's PCP, Delsa Grana.   Self-Care Activities/Patient Goals Self administers medications as prescribed Attend all scheduled provider appointments Monitor BP and record readings Adhere to a heart healthy/DASH diet Call  provider office or care management team with new concerns or questions if needed    Follow Up Plan:  Will follow up within the next month     Patient Care Plan: Fall Risk (Adult)     Problem Identified: Fall Risk      Long-Range Goal: Absence of Fall and Fall-Related Injury   Start Date: 07/25/2020  Expected End Date: 11/22/2020  Priority: High  Current Barriers:  High Risk for Falls r/t Impaired Gait  Clinical Goal(s):  Over the next 120 days, patient will not experience falls or require hospitalization d/t fall related injuries.  Interventions:  Collaboration with Delsa Grana, [name] regarding development and update of comprehensive plan of care as evidenced by provider attestation and co-signature Inter-disciplinary care team collaboration (see longitudinal plan of care) Reviewed medications and discussed potential side effects of medications such as dizziness, lightheadedness and frequent urination. Provided information regarding safety and fall prevention measures. Advised to use an assistive device with all ambulation. Reports having a Rollator walker and quad cane. Advised to avoid overreaching and  attempting to lift weighted objects. Advised to avoid prolonged  activity to prevent accidental falls r/t exertion. Discussed plan for Physical Therapy. Referral was submitted during her last clinic appointment. Pending call back from [place]. Discussed ability to perform self care and tasks in the home. Reports completing self care independently. She requires assistance with tasks in the home and other IADL's. Reports her daughter lives in the home and is available to assist as needed. Denies current need for additional in-home assistance. Update 08/01/20: Ms. Schaafsma reports home health physical therapy has started. Referral was placed during her previous clinic visit. Contacted HH regarding status of referral and to determine if additional clinical documentation was required. Pending call back.   Self-Care Deficits/Patient Goals:  Utilize appropriate assistive device with all ambulation Ensure pathways are clear and well lit Use caution when ambulating and change positions slowly Wear secure fitting non skid footwear when ambulating   Follow Up Plan:  Will follow up within the next month      Ms. Maestre verbalized understanding of the information discussed. Declined need for mailed/printed instructions. A member of the care management team will follow up within the next month.    [name] Care Management [facility] [phone]

## 2020-08-26 NOTE — Telephone Encounter (Signed)
I called patient she states that it has been overrided for her to get her prescription early.

## 2020-08-26 NOTE — Patient Instructions (Signed)
Thank you for allowing the Chronic Care Management team to participate in your care.   Goals Addressed: Patient Care Plan: Hypertension and Hyperlipidemia     Problem Identified: Hypertension and Hyperlipidemia      Long-Range Goal: Hypertension and Hyperlipidemia Monitored   Start Date: 07/25/2020  Expected End Date: 11/22/2020  Priority: Medium  Note:   Objective:  Last practice recorded BP readings:  BP Readings from Last 3 Encounters:  07/15/20 132/78  06/20/20 (!) 148/60  06/04/20 128/86   Most recent eGFR/CrCl: No results found for: EGFR  No components found for: CRCL  Lab Results  Component Value Date   CHOL 169 06/10/2020   HDL 41.10 06/10/2020   LDLCALC 93 06/10/2020   TRIG 172.0 (H) 06/10/2020   CHOLHDL 4 06/10/2020    Current Barriers:  Chronic Care Management support and educational needs related to Hypertension and Hyperlipidemia  Case Manager Clinical Goal(s):  Over the next 120 days, patient will demonstrate improved adherence to prescribed treatment plan as evidenced by taking all medications as prescribed, monitoring and recording blood pressure as directed and adhering to heart healthy/DASH diet.   Interventions:  Collaboration with Delsa Grana, [name] regarding development and update of comprehensive plan of care as evidenced by provider attestation and co-signature Inter-disciplinary care team collaboration (see longitudinal plan of care) Evaluation of current treatment plan related to hypertension and hyperlipidemia self management. Reviewed medications and importance of compliance. Reports taking as prescribed. Reports several cardiac medications will require refills within the next few weeks. Medication adjustments were made following her hospitalization. Reports her local pharmacy routed requests to the inpatient/attending providers instead of her primary care provider. [facility] notified [facility]. Agreed to update the team if additional assistance is  needed. Provided verbal information regarding established blood pressure parameters and indications for notifying a provider. Advised to monitor blood pressure as instructed and record readings.  Reviewed nutritional intake and adherence to a heart healthy/DASH diet. Advised to monitor sodium intake. Advised to read nutrition labels and avoid highly processed foods when possible. Discussed s/sx of heart attack, stroke and worsening symptoms that require immediate medical attention.  Update 08/05/2020: Received update from [place]. Ms. Booton will require new orders for amlodipine, hydralazine and hydrochlorothiazide. Per pharmacy technician, the current order was submitted by an inpatient physician. New orders are required if her provider prefers that she remain on the current medications and doses. Message forwarded to PCP.   Self-Care Activities/Patient Goals Self administers medications as prescribed Contact staff if unable to obtain needed refills Attend all scheduled provider appointments Monitor BP and record readings Adhere to a heart healthy/DASH diet Call  provider office or care management team with new concerns or questions if needed    Follow Up Plan:  Will follow up within the next month     Patient Care Plan: Fall Risk (Adult)     Problem Identified: Fall Risk      Long-Range Goal: Absence of Fall and Fall-Related Injury   Start Date: 07/25/2020  Expected End Date: 11/22/2020  Priority: High  Note:   Current Barriers:  High Risk for Falls r/t Impaired Gait  Clinical Goal(s):  Over the next 120 days, patient will not experience falls or require hospitalization d/t fall related injuries.  Interventions:  Collaboration with Delsa Grana, [name] regarding development and update of comprehensive plan of care as evidenced by provider attestation and co-signature Inter-disciplinary care team collaboration (see longitudinal plan of care) Reviewed medications  and discussed potential side  effects of medications such as dizziness, lightheadedness and frequent urination. Provided information regarding safety and fall prevention measures. Advised to use an assistive device with all ambulation. Reports having a Rollator walker and quad cane. Advised to avoid overreaching and attempting to lift weighted objects. Advised to avoid prolonged activity to prevent accidental falls r/t exertion. Discussed plan for Physical Therapy. Referral was submitted during her last clinic appointment. Pending call back from [place]. Discussed ability to perform self care and tasks in the home. Reports completing self care independently. She requires assistance with tasks in the home and other IADL's. Reports her daughter lives in the home and is available to assist as needed. Denies current need for additional in-home assistance. Update 08/05/20: Ms. Petralia reports receiving a message from staff at [facility]. Pending start of physical therapy services. Advised to contact clinic or update the care management team if services are not initiated d/t agency staffing. Will submit new referral if needed.    Self-Care Deficits/Patient Goals:  Utilize appropriate assistive device with all ambulation Ensure pathways are clear and well lit Use caution when ambulating and change positions slowly Wear secure fitting non skid footwear when ambulating   Follow Up Plan:  Will follow up within the next month        A member of the care management team will follow up with Ms. Smock within the next month.   [name] Care Management [facility] [phone]

## 2020-08-27 ENCOUNTER — Other Ambulatory Visit: Payer: Self-pay | Admitting: Family Medicine

## 2020-08-27 ENCOUNTER — Other Ambulatory Visit: Payer: Self-pay | Admitting: Endocrinology

## 2020-08-27 DIAGNOSIS — E1142 Type 2 diabetes mellitus with diabetic polyneuropathy: Secondary | ICD-10-CM

## 2020-08-27 DIAGNOSIS — M791 Myalgia, unspecified site: Secondary | ICD-10-CM

## 2020-08-28 ENCOUNTER — Telehealth: Payer: Self-pay | Admitting: Nutrition

## 2020-08-28 DIAGNOSIS — E1165 Type 2 diabetes mellitus with hyperglycemia: Secondary | ICD-10-CM

## 2020-08-28 NOTE — Telephone Encounter (Signed)
Talked with patient.  Told her I have not samples and that she will need to call [employer] back and to get those pods overnighted to her.  She agreed to do this.

## 2020-08-30 ENCOUNTER — Other Ambulatory Visit: Payer: Self-pay

## 2020-08-30 ENCOUNTER — Ambulatory Visit
Admission: RE | Admit: 2020-08-30 | Discharge: 2020-08-30 | Disposition: A | Discharge status: Home / Self Care | Payer: Medicare Other | Source: Ambulatory Visit | Attending: Surgery | Admitting: Surgery

## 2020-08-30 DIAGNOSIS — D3501 Benign neoplasm of right adrenal gland: Secondary | ICD-10-CM | POA: Diagnosis present

## 2020-08-30 DIAGNOSIS — R140 Abdominal distension (gaseous): Secondary | ICD-10-CM | POA: Insufficient documentation

## 2020-08-30 MED ORDER — IOHEXOL 350 MG/ML SOLN
100.0000 mL | Freq: Once | INTRAVENOUS | Status: AC | PRN
  Administered 2020-08-30: 100 mL via INTRAVENOUS

## 2020-09-02 ENCOUNTER — Telehealth: Payer: Self-pay

## 2020-09-02 NOTE — Telephone Encounter (Signed)
Left message for the patient to call back about her CT scan results. Per Dr Dahlia Byes no surprises found. No cancer or anything worry some. She will follow up as schedule and he will discuss in more detail.

## 2020-09-02 NOTE — Telephone Encounter (Signed)
Incoming call from patient.  She has been informed of her CT results and verbalized understanding and reminded of her next follow up appointment with the doctor.

## 2020-09-04 ENCOUNTER — Encounter: Payer: Self-pay | Admitting: Surgery

## 2020-09-04 ENCOUNTER — Ambulatory Visit (INDEPENDENT_AMBULATORY_CARE_PROVIDER_SITE_OTHER): Payer: Medicare Other | Admitting: Surgery

## 2020-09-04 ENCOUNTER — Other Ambulatory Visit: Payer: Self-pay

## 2020-09-04 VITALS — BP 182/110 | HR 89 | Temp 98.6°F | Ht 66.0 in | Wt 251.0 lb

## 2020-09-04 DIAGNOSIS — D3501 Benign neoplasm of right adrenal gland: Secondary | ICD-10-CM

## 2020-09-04 DIAGNOSIS — Z8719 Personal history of other diseases of the digestive system: Secondary | ICD-10-CM

## 2020-09-04 DIAGNOSIS — R140 Abdominal distension (gaseous): Secondary | ICD-10-CM

## 2020-09-04 NOTE — Patient Instructions (Signed)
We will call you about the lab testing we want you to do.  Follow up here in 1 month.  We have sent a referral to [facility]. The will call you for this appointment.

## 2020-09-05 ENCOUNTER — Other Ambulatory Visit: Payer: Self-pay

## 2020-09-05 ENCOUNTER — Telehealth: Payer: Self-pay

## 2020-09-05 DIAGNOSIS — D3501 Benign neoplasm of right adrenal gland: Secondary | ICD-10-CM

## 2020-09-05 MED ORDER — DEXAMETHASONE 1 MG PO TABS: ORAL_TABLET | ORAL | 0 refills | Status: DC

## 2020-09-05 NOTE — Telephone Encounter (Signed)
Spoke with patient about her lab draw instructions. She will pick up her 24 hour urine container from the lab at [facility], but will not have her blood drawn at that time. She will need to take her Dexamethasone 40m tablet at 11 pm the night prior to having her labs drawn. [facility] She will also bring back her completed 24 hour urine container at that time. She is to call back with any questions.

## 2020-09-06 ENCOUNTER — Telehealth: Payer: Self-pay

## 2020-09-06 ENCOUNTER — Other Ambulatory Visit: Payer: Self-pay

## 2020-09-06 DIAGNOSIS — R1084 Generalized abdominal pain: Secondary | ICD-10-CM

## 2020-09-06 DIAGNOSIS — R140 Abdominal distension (gaseous): Secondary | ICD-10-CM

## 2020-09-06 NOTE — Progress Notes (Signed)
Outpatient Surgical Follow Up  09/06/2020  [name] is an 55 y.o. female.   Chief Complaint  Patient presents with   Follow-up    SBO    HPI: [name] is a 55 year old female well-known to me with history of chronic abdominal pain constipation recently admitted for partial small bowel obstruction versus ileus.  I recently ordered a CT enterography that I have personally reviewed showing no evidence of discrete lesions within the small bowel.  She does have chronic diverticulosis but no evidence of active disease.  She does have a small right adrenal adenoma.  This has not been worked up yet.  She reports intermittent epigastric pain without any specific alleviating or aggravating factors.  Currently she reports some constipations and sometimes some loose stools.  This is likely related to more of a functional issue.  She did had a prior open ventral hernia repair several years ago.  She is diabetic has COPD and her BMI is 40.  Past Medical History:  Diagnosis Date   Anxiety and depression 09/13/2006   Qualifier: Diagnosis of  By: Hassell Done [name], [place]     Arthritis    joint pain    Asthma    COPD (chronic obstructive pulmonary disease) ([place])    chronic bronchitis    Depression    Diabetes mellitus    since age 7; type 2 IDDM   Diabetic neuropathy, painful ([place])    FEET AND HANDS   Diabetic retinopathy ([place])    Diastolic dysfunction    a.  Echo 11/17: EF 60-65%, mild LVH, no RWMA, Gr1DD, mild AI, dilated aortic root measuring 38 mm, mildly dilated ascending aorta   Dilated aortic root (H[facility])    32m by echo 11/2015   Dizziness    secondary to diabetes and hypertension    Epilepsy idiopathic petit mal ([place]    last seizure 2012;controlled w/ topomax   GERD (gastroesophageal reflux disease)    Glaucoma    NOT ON ANY EYE DROPS    Headache(784.0)    migraines    Heart murmur    born with    History of stress test    a. 11/17: Normal perfusion, EF 53%, normal  study   Hx of blood clots    hematomas removed from left side of brain from 147moto [id number]    Hyperlipidemia    Hypertension    Legally blind    left eye    MIGRAINE HEADACHE 09/14/2006   Qualifier: Diagnosis of  By: MarHassell DoneP, Nykedtra     [facility]  [facility]  Patient reported history of without objective documentation   Nephrolithiasis    frequent urination , urination at nite  PT SEEN IN ER 09/22/11 FOR BACK AND RT SIDED PAIN--HAS KNOWN STONE RT URETER AND UA IN ER SHOWED UTI   Pain 09/23/11   LOWER BACK AND RIGHT SIDE--PT HAS RIGHT URETERAL STONE   Pneumonia    hx of 2009   Pyelonephritis    [place]  Sleep ap[facility]    [facility] ; mild   Stress incontinence    Stroke ([facility]  last 2003  RESIDUAL LEFT LEG WEAKNESS--NO OTHER RESIDUAL PROBLEMS    Past Surgical History:  Procedure Laterality Date   BRAIN HEMATOMA EVACUATION     five procedures total, first procedure when 10 41 monthsd, last at 4 y64ars of age.   CYSTOSCOPY W/ RETROGRADES  09/24/2011   Procedure: CYSTOSCOPY WITH RETROGRADE PYELOGRAM;  Surgeon: Malka So, MD;  Location: [facility];  Service: Urology;  Laterality: Right;   CYSTOSCOPY WITH URETEROSCOPY  09/24/2011   Procedure: CYSTOSCOPY WITH URETEROSCOPY;  Surgeon: Malka So, MD;  Location: [facility];  Service: Urology;  Laterality: Right;  Balloon dilation right ureter    CYSTOSCOPY/RETROGRADE/URETEROSCOPY  08/24/2011   Procedure: CYSTOSCOPY/RETROGRADE/URETEROSCOPY;  Surgeon: Molli Hazard, MD;  Location: [facility];  Service: Urology;  Laterality: Right;  Cysto, Right retrograde Pyelogram, right stent placement.    INCISIONAL HERNIA REPAIR  05/05/2011   Procedure: LAPAROSCOPIC INCISIONAL HERNIA;  Surgeon: Rolm Bookbinder, MD;  Location: [facility];  Service: General;  Laterality: N/A;   kidney stone removal     MASS EXCISION  05/05/2011   Procedure: EXCISION MASS;  Surgeon: Rolm Bookbinder, MD;  Location: [place];  Service: General;  Laterality: Right;   PCNL     RETINAL DETACHMENT SURGERY Left 1990   RIGHT/LEFT HEART CATH AND CORONARY ANGIOGRAPHY N/A 04/28/2019   Procedure: RIGHT/LEFT HEART CATH AND CORONARY ANGIOGRAPHY;  Surgeon: Yolonda Kida, MD;  Location: [place];  Service: Cardiovascular;  Laterality: N/A;   SHUNT REMOVAL     shunt inserted at age 50 removed at age 58    [place]    Family History  Problem Relation Age of Onset   Uterine cancer Mother    Hypertension Mother    Cancer Mother    Brain cancer Maternal Grandmother    Hypertension Maternal Grandmother    Birth defects Daughter    Hypertension Daughter    Cirrhosis Maternal Grandfather    ADD / ADHD Maternal Grandfather    Birth defects Maternal Grandfather    Diabetes Maternal Grandfather    Anesthesia problems Neg Hx    Colon cancer Neg Hx    Esophageal cancer Neg Hx    Pancreatic cancer Neg Hx     Social History:  reports that she quit smoking about 33 years ago. Her smoking use included cigarettes. She has a 15.00 pack-year smoking history. She has never used smokeless tobacco. She reports current alcohol use of about 1.0 standard drink per week. She reports that she does not use drugs.  Allergies:  Allergies  Allergen Reactions   Codeine Swelling and Other (See Comments)    Swelling and burning of mouth (inside)   Lactose Intolerance (Gi) Nausea And Vomiting    Medications reviewed.    ROS Full ROS performed and is otherwise negative other than what is stated in HPI   BP (!) 182/110   Pulse 89   Temp 98.6 F (37 C)   Ht 5' 6"  (1.676 m)   Wt 251 lb (113.9 kg)   LMP  (LMP Unknown)   SpO2 98%   BMI 40.51 kg/m   Physical Exam     No results found for this or any previous visit (from the past 74 hour(s)). No results found.  Assessment/Plan: [name] is a 55 year old female with history of chronic abdominal functional issues and pain.  At this time CT  enterography did not find any lesions that would warrant surgical intervention.  She does have a right adrenal adenoma that we will start biochemical work-up.  We have ordered appropriate testing to rule out a functional adenoma.  Even though its small up to 15% of incidentaloma's are functional and this definitely warrants further work-up.  Regarding her abdominal pain she will benefit from evaluation  by GI.  She does have a prior relationship with a [facility].  We will make appropriate work-up as I do think that she will benefit from endoscopic evaluation.  We will follow her up once she complete her biochemical work-up for adrenal incidentaloma.  Extensive counseling provided. I do think it may be worth to obtain RUQ U/S to make sure no gallstones are potentially causing pain. She may also required HIDA scan if no other obvious issues are found. Greater than 50% of the 40vminutes  visit was spent in counseling/coordination of care   Caroleen Hamman, MD [facility]

## 2020-09-06 NOTE — Telephone Encounter (Signed)
Per Dr Dahlia Byes wants to get the patient an Ultrasound of her Gallbladder to rule this out due to her abdominal distension and discomfort. The patient is scheduled for and ultrasound at the [place] center on 09/19/20, arrival time is 9:15 am and she is to have nothing to eat or drink after midnight the night prior. She is aware of date, time, and instructions.

## 2020-09-07 ENCOUNTER — Other Ambulatory Visit: Payer: Self-pay

## 2020-09-07 DIAGNOSIS — R42 Dizziness and giddiness: Secondary | ICD-10-CM | POA: Diagnosis present

## 2020-09-07 DIAGNOSIS — Z5321 Procedure and treatment not carried out due to patient leaving prior to being seen by health care provider: Secondary | ICD-10-CM | POA: Diagnosis not present

## 2020-09-07 DIAGNOSIS — R030 Elevated blood-pressure reading, without diagnosis of hypertension: Secondary | ICD-10-CM | POA: Diagnosis not present

## 2020-09-07 LAB — CBC
HCT: 42.2 % (ref 36.0–46.0)
Hemoglobin: 13.8 g/dL (ref 12.0–15.0)
MCH: 29.9 pg (ref 26.0–34.0)
MCHC: 32.7 g/dL (ref 30.0–36.0)
MCV: 91.5 fL (ref 80.0–100.0)
Platelets: 262 10*3/uL (ref 150–400)
RBC: 4.61 MIL/uL (ref 3.87–5.11)
RDW: 14.4 % (ref 11.5–15.5)
WBC: 7.9 10*3/uL (ref 4.0–10.5)
nRBC: 0 % (ref 0.0–0.2)

## 2020-09-07 LAB — BASIC METABOLIC PANEL
Anion gap: 8 (ref 5–15)
BUN: 20 mg/dL (ref 6–20)
CO2: 25 mmol/L (ref 22–32)
Calcium: 9.2 mg/dL (ref 8.9–10.3)
Chloride: 103 mmol/L (ref 98–111)
Creatinine, Ser: 1.03 mg/dL — ABNORMAL HIGH (ref 0.44–1.00)
GFR, Estimated: >60 mL/min (ref >60)
Glucose, Bld: 159 mg/dL — ABNORMAL HIGH (ref 70–99)
Potassium: 3.5 mmol/L (ref 3.5–5.1)
Sodium: 136 mmol/L (ref 135–145)

## 2020-09-07 NOTE — ED Triage Notes (Signed)
Patient reports when she got up this evening she was dizzy and checked her blood pressure and it was elevated.  Patient reports she has been taking her BP meds as prescribed.

## 2020-09-08 ENCOUNTER — Encounter: Payer: Self-pay | Admitting: Family Medicine

## 2020-09-08 ENCOUNTER — Emergency Department
Admission: EM | Admit: 2020-09-08 | Discharge: 2020-09-08 | Disposition: A | Discharge status: Home / Self Care | Payer: Medicare Other | Attending: Emergency Medicine | Admitting: Emergency Medicine

## 2020-09-08 LAB — TROPONIN I (HIGH SENSITIVITY): Troponin I (High Sensitivity): 14 ng/L (ref <18)

## 2020-09-08 NOTE — ED Notes (Signed)
Pt states she is leaving  

## 2020-09-18 ENCOUNTER — Telehealth: Payer: Self-pay | Admitting: Nutrition

## 2020-09-18 MED ORDER — OMNIPOD DASH PODS (GEN 4) MISC: 3 refills | Status: DC

## 2020-09-18 NOTE — Telephone Encounter (Signed)
Called patient. She states she gets her Omnipod supplies from [name]. Sent prescription to them with increase Omnipods

## 2020-09-19 ENCOUNTER — Other Ambulatory Visit: Payer: Self-pay

## 2020-09-19 ENCOUNTER — Ambulatory Visit
Admission: RE | Admit: 2020-09-19 | Discharge: 2020-09-19 | Disposition: A | Discharge status: Home / Self Care | Payer: Medicare Other | Source: Ambulatory Visit | Attending: Surgery | Admitting: Surgery

## 2020-09-19 DIAGNOSIS — R1084 Generalized abdominal pain: Secondary | ICD-10-CM | POA: Insufficient documentation

## 2020-09-19 DIAGNOSIS — R140 Abdominal distension (gaseous): Secondary | ICD-10-CM | POA: Diagnosis present

## 2020-09-24 ENCOUNTER — Telehealth: Payer: Self-pay

## 2020-09-24 NOTE — Telephone Encounter (Signed)
Called and spoke with the patient let her know per Dr Dahlia Byes that her Ultrasound was normal. She will follow up on 10/07/20 in office.

## 2020-09-25 ENCOUNTER — Other Ambulatory Visit: Payer: Medicare Other

## 2020-09-26 ENCOUNTER — Ambulatory Visit (INDEPENDENT_AMBULATORY_CARE_PROVIDER_SITE_OTHER): Payer: Medicare Other

## 2020-09-26 ENCOUNTER — Telehealth: Payer: Self-pay

## 2020-09-26 DIAGNOSIS — Z9181 History of falling: Secondary | ICD-10-CM

## 2020-09-26 DIAGNOSIS — I10 Essential (primary) hypertension: Secondary | ICD-10-CM

## 2020-09-26 DIAGNOSIS — E1169 Type 2 diabetes mellitus with other specified complication: Secondary | ICD-10-CM

## 2020-09-26 DIAGNOSIS — E785 Hyperlipidemia, unspecified: Secondary | ICD-10-CM

## 2020-09-26 NOTE — Patient Instructions (Addendum)
Thank you for allowing the Chronic Care Management team to participate in your care.    Patient Care Plan: Diabetes Type 2 (Adult)     Problem Identified: Glycemic Management (Diabetes, Type 2)      Long-Range Goal: Glycemic Management Optimized   Start Date: 07/25/2020  Expected End Date: 11/22/2020  Priority: High  Note:   Objective:  Lab Results  Component Value Date   HGBA1C 7.8 (H) 06/10/2020   Lab Results  Component Value Date   CREATININE 0.88 07/15/2020   CREATININE 0.89 06/18/2020   CREATININE 0.83 06/17/2020   No results found for: EGFR   Current Barriers:  Chronic Disease Management support and educational needs related to  Diabetes management.  Case Manager Clinical Goal(s):  Over the next 120 days, patient will demonstrate improved adherence to prescribed treatment plan for diabetes self care/management as evidenced by monitoring of CBG's as instructed, adherence to ADA/ carb modified diet and adherence to prescribed medication regimen.  Interventions:  Collaboration with Delsa Grana, [name] regarding development and update of comprehensive plan of care as evidenced by provider attestation and co-signature Inter-disciplinary care team collaboration (see longitudinal plan of care) Reviewed medications and compliance with treatment plan. Reviewed s/sx of hypoglycemia and hyperglycemia along with appropriate treatment interventions. Reports taking medications as prescribed. Reports some elevated readings over 200 within the last week but reports most fasting readings have been within range. She is aware of indications for notifying the Endocrinology team. Attempting to comply with a diabetic diet. Reports intake has decreased due to alternating episodes of diarrhea and constipation. She is scheduled to follow up with Gastroenterology.   Self-Care Activities/Patient Goals: Self administer medications as prescribed Monitor blood glucose levels as instructed and follow  recommended interventions Adhere to a diabetic/modified carb diet. Contact the clinic or care management team with questions or new concerns as needed   Follow Up Plan:  Will follow up within the next month    Patient Care Plan: Hypertension and Hyperlipidemia     Problem Identified: Hypertension and Hyperlipidemia      Long-Range Goal: Hypertension and Hyperlipidemia Monitored   Start Date: 07/25/2020  Expected End Date: 11/22/2020  Priority: Medium  Note:   Objective:   BP Readings from Last 3 Encounters:  09/08/20 (!) 200/92  09/04/20 (!) 182/110  08/20/20 134/80   Lab Results  Component Value Date   CHOL 169 06/10/2020   HDL 41.10 06/10/2020   LDLCALC 93 06/10/2020   TRIG 172.0 (H) 06/10/2020   CHOLHDL 4 06/10/2020    Current Barriers:  Chronic Care Management support and educational needs related to Hypertension and Hyperlipidemia  Case Manager Clinical Goal(s):  Over the next 120 days, patient will demonstrate improved adherence to prescribed treatment plan as evidenced by taking all medications as prescribed, monitoring and recording blood pressure as directed and adhering to heart healthy/DASH diet.   Interventions:  Collaboration with Delsa Grana, [name] regarding development and update of comprehensive plan of care as evidenced by provider attestation and co-signature Inter-disciplinary care team collaboration (see longitudinal plan of care) Evaluation of current treatment plan related to hypertension and hyperlipidemia self management. Reviewed medications and compliance with treatment plan. Reports recent readings have been significantly elevated. Reports adjusting medications as advised by the Cardiology team. Advised to continue monitoring BP and follow up with the Cardiology team for elevated readings outside of established range. Discussed s/sx of heart attack, stroke and worsening symptoms that require immediate medical attention.     Self-Care  Activities/Patient Goals Self administers medications as prescribed Attend all scheduled provider appointments Monitor BP and record readings Adhere to a heart healthy/DASH diet Continue adjusting medications as advised by the Cardiology team Call  provider office or care management team with new concerns or questions if needed    Follow Up Plan:  Will follow up within the next month     Patient Care Plan: Fall Risk (Adult)     Problem Identified: Fall Risk      Long-Range Goal: Absence of Fall and Fall-Related Injury   Start Date: 07/25/2020  Expected End Date: 11/22/2020  Priority: High  Note:   Current Barriers:  High Risk for Falls r/t Impaired Gait  Clinical Goal(s):  Over the next 120 days, patient will not experience falls or require hospitalization d/t fall related injuries.  Interventions:  Collaboration with Delsa Grana, [name] regarding development and update of comprehensive plan of care as evidenced by provider attestation and co-signature Inter-disciplinary care team collaboration (see longitudinal plan of care) Reviewed information regarding safety and fall prevention measures. Advised to continue using an assistive device with all ambulation.  Discussed plan for Physical Therapy. Reports receiving a call from [facility] indicating that a new/updated referral was required to continue services. She is agreeable to service with a different agency if [facility] can not staff her case.  Discussed ability to perform self care and tasks in the home. She continues to perform self care independently but requires assistance with tasks in the home and other IADL's. Her daughter remains available in the home to assist as needed.    Self-Care Deficits/Patient Goals:  Utilize appropriate assistive device with all ambulation Ensure pathways are clear and well lit Use caution when ambulating and change positions slowly Wear secure fitting non skid footwear when  ambulating   Follow Up Plan:  Will follow up within the next month       Ms. Sylvia verbalized understanding of the information discussed during the telephonic outreach. Declined need for mailed/printed instructions. A member of the care management team will follow up within the next month.   [name] Care Management [facility] [phone]

## 2020-09-26 NOTE — Telephone Encounter (Signed)
Error Please Disregard

## 2020-09-26 NOTE — Chronic Care Management (AMB) (Signed)
Chronic Care Management   CCM RN Visit Note  09/26/2020 Name: [name] MRN: [id number] DOB: Jun 12, 1965  Subjective: [name] is a 55 y.o. year old female who is a primary care patient of Delsa Grana, [place]. The care management team was consulted for assistance with disease management and care coordination needs.    Engaged with patient by telephone for follow up visit in response to provider referral for case management and care coordination services.   Consent to Services:  The patient was given information about Chronic Care Management services, agreed to services, and gave verbal consent prior to initiation of services.  Please see initial visit note for detailed documentation.    Assessment: Review of patient past medical history, allergies, medications, health status, including review of consultants reports, laboratory and other test data, was performed as part of comprehensive evaluation and provision of chronic care management services.   SDOH (Social Determinants of Health) assessments and interventions performed: No  CCM Care Plan  Allergies  Allergen Reactions   Codeine Swelling and Other (See Comments)    Swelling and burning of mouth (inside)   Lactose Intolerance (Gi) Nausea And Vomiting    Outpatient Encounter Medications as of 09/26/2020  Medication Sig Note   albuterol (ACCUNEB) 1.25 MG/3ML nebulizer solution Inhale 1 ampule into the lungs every 6 (six) hours as needed for wheezing or shortness of breath.    albuterol (PROAIR HFA) 108 (90 Base) MCG/ACT inhaler INHALE 2 PUFFS BY MOUTH EVERY 6 HOURS AS NEEDED FOR WHEEZING OR SHORTNESS OF BREATH (Patient taking differently: Inhale 2 puffs into the lungs every 6 (six) hours as needed for wheezing or shortness of breath.)    amLODipine (NORVASC) 10 MG tablet Take 1 tablet (10 mg total) by mouth daily.    baclofen (LIORESAL) 10 MG tablet Take 1 tablet (10 mg total) by mouth 3 (three) times daily as  needed for muscle spasms.    budesonide (PULMICORT) 0.5 MG/2ML nebulizer solution Inhale 0.5 mg into the lungs daily.    carvedilol (COREG) 12.5 MG tablet Take 12.5 mg by mouth 2 (two) times daily with a meal.    cholecalciferol (VITAMIN D3) 25 MCG (1000 UNIT) tablet Take 1,000 Units by mouth daily.    clopidogrel (PLAVIX) 75 MG tablet Take 1 tablet (75 mg total) by mouth daily.    cycloSPORINE (RESTASIS) 0.05 % ophthalmic emulsion Place 2 drops into both eyes 2 (two) times daily. 07/25/2020: Reports speaking with local pharmacy. Pending receipt.   dapagliflozin propanediol (FARXIGA) 10 MG TABS tablet Take 1 tablet (10 mg total) by mouth daily before breakfast.    dexamethasone (DECADRON) 1 MG tablet Take at 11 pm the night prior to your blood draw.    Dulaglutide (TRULICITY) 3 QM/0.8QP SOPN Inject 3 mg into the skin once a week. (Patient taking differently: Inject 3 mg into the skin every Friday.)    DULoxetine (CYMBALTA) 20 MG capsule TAKE 2 CAPSULES EVERY MORNING AND 1 CAPSULE EVERY NIGHT.    EPINEPHrine 0.3 mg/0.3 mL IJ SOAJ injection Inject 0.3 mg into the muscle as needed for anaphylaxis.    ezetimibe (ZETIA) 10 MG tablet Take 1 tablet (10 mg total) by mouth daily.    fluticasone (FLONASE) 50 MCG/ACT nasal spray Place 2 sprays into both nostrils daily.    gabapentin (NEURONTIN) 600 MG tablet 600 mg in the morning, 600 mg in the afternoon, 1200 mg nightly. (Patient taking differently: Take 600-1,200 mg by mouth See admin instructions. Take 1  tablet (678m) by mouth every morning and afternoon and then take 2 tablets (12055m by mouth every night)    glucose blood (ACCU-CHEK GUIDE) test strip Use as instructed to check blood sugar 4 times per day Dx code E11.9    hydrALAZINE (APRESOLINE) 25 MG tablet Take 1 tablet (25 mg total) by mouth every 8 (eight) hours.    hydrochlorothiazide (HYDRODIURIL) 25 MG tablet Take 1 tablet (25 mg total) by mouth daily.    Insulin Disposable Pump (OMNIPOD DASH PODS,  GEN 4,) MISC Change pod every 2 days    insulin lispro (HUMALOG) 100 UNIT/ML injection USE UP TO 150 UNITS/DAY IN INSULIN PUMP    levETIRAcetam (KEPPRA) 500 MG tablet Take 500-750 mg by mouth See admin instructions. Take 1 tablet (50046mby mouth every morning and take 1 tablets (750m59my mouth every night    losartan (COZAAR) 100 MG tablet Take 1 tablet (100 mg total) by mouth daily.    mirabegron ER (MYRBETRIQ) 50 MG TB24 tablet Take 1 tablet (50 mg total) by mouth daily.    nitrofurantoin (MACRODANTIN) 50 MG capsule Take 50 mg by mouth at bedtime.    nitroGLYCERIN (NITROSTAT) 0.4 MG SL tablet Place 0.4 mg under the tongue every 5 (five) minutes as needed for chest pain.    pantoprazole (PROTONIX) 40 MG tablet Take 1 tablet (40 mg total) by mouth daily.    polyethylene glycol (MIRALAX / GLYCOLAX) 17 g packet Take 17 g by mouth daily as needed for mild constipation.    potassium chloride SA (KLOR-CON M20) 20 MEQ tablet You have not needed potassium while in the hospital.  Hold until outpatient followup. (Patient taking differently: Take 20 mEq by mouth 2 (two) times daily.)    simvastatin (ZOCOR) 20 MG tablet Take 20 mg by mouth daily.    spironolactone (ALDACTONE) 25 MG tablet Take 25 mg by mouth daily.    terconazole (TERAZOL 7) 0.4 % vaginal cream Place 1 applicator vaginally at bedtime as needed (irritation).    WIXELA INHUB 250-50 MCG/DOSE AEPB Inhale 1 puff into the lungs 2 (two) times daily.    No facility-administered encounter medications on file as of 09/26/2020.    Patient Active Problem List   Diagnosis Date Noted   Weakness of right arm    SBO (small bowel obstruction) (HCC)    Acute cystitis with hematuria    Right sided weakness    Adrenal adenoma, right    Nausea & vomiting 06/13/2020   Ileus (HCC)[place]/26/2022   PAD (peripheral artery disease) (HCC)[place]/05/2020   Leg pain 01/24/2020   Leg pain, bilateral 01/11/2020   Numbness and tingling 01/11/2020   Abnormal ankle  brachial index (ABI) 12/18/2019   Hypoglycemia 05/17/2019   Hypoglycemia associated with type 2 diabetes mellitus (HCC)[place]/27/2021   Chest pain 04/27/2019   Type 2 diabetes mellitus without complication, with long-term current use of insulin (HCC)[place]/08/2019   COPD (chronic obstructive pulmonary disease) (HCC)[place]/08/2019   CAD (coronary artery disease) 04/27/2019   H/O: stroke with residual effects    Myalgia 04/19/2019   Central pain syndrome 04/19/2019   Non-intractable vomiting 03/01/2019   History of CVA (cerebrovascular accident) 07/08/2018   History of seizure 07/08/2018   Diabetic polyneuropathy associated with type 2 diabetes mellitus (HCC)[place]/12/2018   Obesity (BMI 30-39.9) 03/02/2018   Vitamin D deficiency 01/24/2018   Vulvovaginal candidiasis 01/24/2018   Hypokalemia 06/08/2017   Overactive bladder 05/18/2017   Asthma 06/22/2016   Dilated aortic  root ([place])    Sacroiliac dysfunction 10/09/2014   Chronic low back pain 09/03/2014   CVA, old, hemiparesis ([place]) 09/03/2014   OSA (obstructive sleep apnea) 05/15/2014   Restrictive airway disease 05/15/2014   Colitis 02/22/2014   Type 2 diabetes mellitus with hyperlipidemia ([place]) 01/21/2014   Chronic pain syndrome 01/21/2014   Headache disorder 09/07/2013   Diabetic neuropathy ([place]) 05/23/2013   Mixed urge and stress incontinence 08/25/2011   Ureteral stricture, right 08/25/2011   Pyelonephritis 08/22/2011   Nephrolithiasis 08/22/2011   Incisional hernia 05/10/2011   Class 3 severe obesity with serious comorbidity and body mass index (BMI) of 40.0 to 44.9 in adult ([place]) 05/10/2011   Tachycardia 05/08/2011   BLINDNESS, LEGAL, [place] DEFINITION 09/14/2006   Hyperlipemia 09/13/2006   Accelerated hypertension 09/13/2006   MYOCARDIAL INFARCTION, HX OF 09/13/2006   GERD 09/13/2006   Seizure disorder ([place]) 09/13/2006    Conditions to be addressed/monitored: HTN, DMII and Fall Risk  Patient Care Plan: Diabetes Type 2 (Adult)      Problem Identified: Glycemic Management (Diabetes, Type 2)      Long-Range Goal: Glycemic Management Optimized   Start Date: 07/25/2020  Expected End Date: 11/22/2020  Priority: High  Note:   Objective:  Lab Results  Component Value Date   HGBA1C 7.8 (H) 06/10/2020   Lab Results  Component Value Date   CREATININE 0.88 07/15/2020   CREATININE 0.89 06/18/2020   CREATININE 0.83 06/17/2020   No results found for: EGFR   Current Barriers:  Chronic Disease Management support and educational needs related to  Diabetes management.  Case Manager Clinical Goal(s):  Over the next 120 days, patient will demonstrate improved adherence to prescribed treatment plan for diabetes self care/management as evidenced by monitoring of CBG's as instructed, adherence to ADA/ carb modified diet and adherence to prescribed medication regimen.  Interventions:  Collaboration with Delsa Grana, [name] regarding development and update of comprehensive plan of care as evidenced by provider attestation and co-signature Inter-disciplinary care team collaboration (see longitudinal plan of care) Reviewed medications and compliance with treatment plan. Reviewed s/sx of hypoglycemia and hyperglycemia along with appropriate treatment interventions. Reports taking medications as prescribed. Reports some elevated readings over 200 within the last week but reports most fasting readings have been within range. She is aware of indications for notifying the Endocrinology team. Attempting to comply with a diabetic diet. Reports intake has decreased due to alternating episodes of diarrhea and constipation. She is scheduled to follow up with Gastroenterology.   Self-Care Activities/Patient Goals: Self administer medications as prescribed Monitor blood glucose levels as instructed and follow recommended interventions Adhere to a diabetic/modified carb diet. Contact the clinic or care management team with questions or new  concerns as needed   Follow Up Plan:  Will follow up within the next month    Patient Care Plan: Hypertension and Hyperlipidemia     Problem Identified: Hypertension and Hyperlipidemia      Long-Range Goal: Hypertension and Hyperlipidemia Monitored   Start Date: 07/25/2020  Expected End Date: 11/22/2020  Priority: Medium  Note:   Objective:   BP Readings from Last 3 Encounters:  09/08/20 (!) 200/92  09/04/20 (!) 182/110  08/20/20 134/80   Lab Results  Component Value Date   CHOL 169 06/10/2020   HDL 41.10 06/10/2020   LDLCALC 93 06/10/2020   TRIG 172.0 (H) 06/10/2020   CHOLHDL 4 06/10/2020    Current Barriers:  Chronic Care Management support and educational needs related to Hypertension and Hyperlipidemia  Case Manager Clinical Goal(s):  Over the next 120 days, patient will demonstrate improved adherence to prescribed treatment plan as evidenced by taking all medications as prescribed, monitoring and recording blood pressure as directed and adhering to heart healthy/DASH diet.   Interventions:  Collaboration with Delsa Grana, [name] regarding development and update of comprehensive plan of care as evidenced by provider attestation and co-signature Inter-disciplinary care team collaboration (see longitudinal plan of care) Evaluation of current treatment plan related to hypertension and hyperlipidemia self management. Reviewed medications and compliance with treatment plan. Reports recent readings have been significantly elevated. Reports adjusting medications as advised by the Cardiology team. Advised to continue monitoring BP and follow up with the Cardiology team for elevated readings outside of established range. Discussed s/sx of heart attack, stroke and worsening symptoms that require immediate medical attention.     Self-Care Activities/Patient Goals Self administers medications as prescribed Attend all scheduled provider appointments Monitor BP and record  readings Adhere to a heart healthy/DASH diet Continue adjusting medications as advised by the Cardiology team Call  provider office or care management team with new concerns or questions if needed    Follow Up Plan:  Will follow up within the next month     Patient Care Plan: Fall Risk (Adult)     Problem Identified: Fall Risk      Long-Range Goal: Absence of Fall and Fall-Related Injury   Start Date: 07/25/2020  Expected End Date: 11/22/2020  Priority: High  Note:   Current Barriers:  High Risk for Falls r/t Impaired Gait  Clinical Goal(s):  Over the next 120 days, patient will not experience falls or require hospitalization d/t fall related injuries.  Interventions:  Collaboration with Delsa Grana, [name] regarding development and update of comprehensive plan of care as evidenced by provider attestation and co-signature Inter-disciplinary care team collaboration (see longitudinal plan of care) Reviewed information regarding safety and fall prevention measures. Advised to continue using an assistive device with all ambulation.  Discussed plan for Physical Therapy. Reports receiving a call from [facility] indicating that a new/updated referral was required to continue services. She is agreeable to service with a different agency if [facility] can not staff her case.  Discussed ability to perform self care and tasks in the home. She continues to perform self care independently but requires assistance with tasks in the home and other IADL's. Her daughter remains available in the home to assist as needed.    Self-Care Deficits/Patient Goals:  Utilize appropriate assistive device with all ambulation Ensure pathways are clear and well lit Use caution when ambulating and change positions slowly Wear secure fitting non skid footwear when ambulating   Follow Up Plan:  Will follow up within the next month        PLAN A member of the care management team will follow up  within the next month.   [name] Care Management [facility] [phone]

## 2020-09-27 ENCOUNTER — Ambulatory Visit: Payer: Self-pay

## 2020-09-27 ENCOUNTER — Ambulatory Visit: Payer: Medicare Other | Admitting: Endocrinology

## 2020-09-27 DIAGNOSIS — J449 Chronic obstructive pulmonary disease, unspecified: Secondary | ICD-10-CM

## 2020-09-27 DIAGNOSIS — E785 Hyperlipidemia, unspecified: Secondary | ICD-10-CM

## 2020-09-27 DIAGNOSIS — E1169 Type 2 diabetes mellitus with other specified complication: Secondary | ICD-10-CM

## 2020-09-27 DIAGNOSIS — I10 Essential (primary) hypertension: Secondary | ICD-10-CM | POA: Diagnosis not present

## 2020-09-27 DIAGNOSIS — Z9181 History of falling: Secondary | ICD-10-CM

## 2020-09-27 NOTE — Chronic Care Management (AMB) (Signed)
Chronic Care Management   CCM RN Visit Note  09/27/2020 Name: [name] MRN: [id number] DOB: 01/09/1966  Subjective: [name] is a 55 y.o. year old female who is a primary care patient of Delsa Grana, [place]. The care management team was consulted for assistance with disease management and care coordination needs.    Care Coordination for Home Health services was conducted today.  Consent to Services:  The patient was given information about Chronic Care Management services, agreed to services, and gave verbal consent prior to initiation of services.  Please see initial visit note for detailed documentation.    Assessment: Review of patient past medical history, allergies, medications, health status, including review of consultants reports, laboratory and other test data, was performed as part of comprehensive evaluation and provision of chronic care management services.   SDOH (Social Determinants of Health) assessments and interventions performed:  No  CCM Care Plan  Allergies  Allergen Reactions   Codeine Swelling and Other (See Comments)    Swelling and burning of mouth (inside)   Lactose Intolerance (Gi) Nausea And Vomiting    Outpatient Encounter Medications as of 09/27/2020  Medication Sig Note   albuterol (ACCUNEB) 1.25 MG/3ML nebulizer solution Inhale 1 ampule into the lungs every 6 (six) hours as needed for wheezing or shortness of breath.    albuterol (PROAIR HFA) 108 (90 Base) MCG/ACT inhaler INHALE 2 PUFFS BY MOUTH EVERY 6 HOURS AS NEEDED FOR WHEEZING OR SHORTNESS OF BREATH (Patient taking differently: Inhale 2 puffs into the lungs every 6 (six) hours as needed for wheezing or shortness of breath.)    amLODipine (NORVASC) 10 MG tablet Take 1 tablet (10 mg total) by mouth daily.    baclofen (LIORESAL) 10 MG tablet Take 1 tablet (10 mg total) by mouth 3 (three) times daily as needed for muscle spasms.    budesonide (PULMICORT) 0.5 MG/2ML nebulizer  solution Inhale 0.5 mg into the lungs daily.    carvedilol (COREG) 12.5 MG tablet Take 12.5 mg by mouth 2 (two) times daily with a meal.    cholecalciferol (VITAMIN D3) 25 MCG (1000 UNIT) tablet Take 1,000 Units by mouth daily.    clopidogrel (PLAVIX) 75 MG tablet Take 1 tablet (75 mg total) by mouth daily.    cycloSPORINE (RESTASIS) 0.05 % ophthalmic emulsion Place 2 drops into both eyes 2 (two) times daily. 07/25/2020: Reports speaking with local pharmacy. Pending receipt.   dapagliflozin propanediol (FARXIGA) 10 MG TABS tablet Take 1 tablet (10 mg total) by mouth daily before breakfast.    dexamethasone (DECADRON) 1 MG tablet Take at 11 pm the night prior to your blood draw.    Dulaglutide (TRULICITY) 3 HD/6.2IW SOPN Inject 3 mg into the skin once a week. (Patient taking differently: Inject 3 mg into the skin every Friday.)    DULoxetine (CYMBALTA) 20 MG capsule TAKE 2 CAPSULES EVERY MORNING AND 1 CAPSULE EVERY NIGHT.    EPINEPHrine 0.3 mg/0.3 mL IJ SOAJ injection Inject 0.3 mg into the muscle as needed for anaphylaxis.    ezetimibe (ZETIA) 10 MG tablet Take 1 tablet (10 mg total) by mouth daily.    fluticasone (FLONASE) 50 MCG/ACT nasal spray Place 2 sprays into both nostrils daily.    gabapentin (NEURONTIN) 600 MG tablet 600 mg in the morning, 600 mg in the afternoon, 1200 mg nightly. (Patient taking differently: Take 600-1,200 mg by mouth See admin instructions. Take 1 tablet (679m) by mouth every morning and afternoon and then take 2  tablets (1241m) by mouth every night)    glucose blood (ACCU-CHEK GUIDE) test strip Use as instructed to check blood sugar 4 times per day Dx code E11.9    hydrALAZINE (APRESOLINE) 25 MG tablet Take 1 tablet (25 mg total) by mouth every 8 (eight) hours.    hydrochlorothiazide (HYDRODIURIL) 25 MG tablet Take 1 tablet (25 mg total) by mouth daily.    Insulin Disposable Pump (OMNIPOD DASH PODS, GEN 4,) MISC Change pod every 2 days    insulin lispro (HUMALOG) 100  UNIT/ML injection USE UP TO 150 UNITS/DAY IN INSULIN PUMP    levETIRAcetam (KEPPRA) 500 MG tablet Take 500-750 mg by mouth See admin instructions. Take 1 tablet (5066m by mouth every morning and take 1 tablets (7505mby mouth every night    losartan (COZAAR) 100 MG tablet Take 1 tablet (100 mg total) by mouth daily.    mirabegron ER (MYRBETRIQ) 50 MG TB24 tablet Take 1 tablet (50 mg total) by mouth daily.    nitrofurantoin (MACRODANTIN) 50 MG capsule Take 50 mg by mouth at bedtime.    nitroGLYCERIN (NITROSTAT) 0.4 MG SL tablet Place 0.4 mg under the tongue every 5 (five) minutes as needed for chest pain.    pantoprazole (PROTONIX) 40 MG tablet Take 1 tablet (40 mg total) by mouth daily.    polyethylene glycol (MIRALAX / GLYCOLAX) 17 g packet Take 17 g by mouth daily as needed for mild constipation.    potassium chloride SA (KLOR-CON M20) 20 MEQ tablet You have not needed potassium while in the hospital.  Hold until outpatient followup. (Patient taking differently: Take 20 mEq by mouth 2 (two) times daily.)    simvastatin (ZOCOR) 20 MG tablet Take 20 mg by mouth daily.    spironolactone (ALDACTONE) 25 MG tablet Take 25 mg by mouth daily.    terconazole (TERAZOL 7) 0.4 % vaginal cream Place 1 applicator vaginally at bedtime as needed (irritation).    WIXELA INHUB 250-50 MCG/DOSE AEPB Inhale 1 puff into the lungs 2 (two) times daily.    No facility-administered encounter medications on file as of 09/27/2020.    Patient Active Problem List   Diagnosis Date Noted   Weakness of right arm    SBO (small bowel obstruction) (HCC)    Acute cystitis with hematuria    Right sided weakness    Adrenal adenoma, right    Nausea & vomiting 06/13/2020   Ileus ([place]CC[place]5/26/2022   PAD (peripheral artery disease) ([place]CC[place]1/05/2020   Leg pain 01/24/2020   Leg pain, bilateral 01/11/2020   Numbness and tingling 01/11/2020   Abnormal ankle brachial index (ABI) 12/18/2019   Hypoglycemia 05/17/2019   Hypoglycemia  associated with type 2 diabetes mellitus (HCC[place]awk [place]4/27/2021   Chest pain 04/27/2019   Type 2 diabetes mellitus without complication, with long-term current use of insulin ([place]CC[place]te[place]4/08/2019   COPD (chronic obstructive pulmonary disease) (HCCShaker Heights4/08/2019   CAD (coronary artery disease) 04/27/2019   H/O: stroke with residual effects    Myalgia 04/19/2019   Central pain syndrome 04/19/2019   Non-intractable vomiting 03/01/2019   History of CVA (cerebrovascular accident) 07/08/2018   History of seizure 07/08/2018   Diabetic polyneuropathy associated with type 2 diabetes mellitus (HCC[place]2/12/2018   Obesity (BMI 30-39.9) 03/02/2018   Vitamin D deficiency 01/24/2018   Vulvovaginal candidiasis 01/24/2018   Hypokalemia 06/08/2017   Overactive bladder 05/18/2017   Asthma 06/22/2016   Dilated aortic root (HCC)    Sacroiliac dysfunction 10/09/2014   Chronic low  back pain 09/03/2014   CVA, old, hemiparesis ([place]) 09/03/2014   OSA (obstructive sleep apnea) 05/15/2014   Restrictive airway disease 05/15/2014   Colitis 02/22/2014   Type 2 diabetes mellitus with hyperlipidemia ([place]) 01/21/2014   Chronic pain syndrome 01/21/2014   Headache disorder 09/07/2013   Diabetic neuropathy ([place]) 05/23/2013   Mixed urge and stress incontinence 08/25/2011   Ureteral stricture, right 08/25/2011   Pyelonephritis 08/22/2011   Nephrolithiasis 08/22/2011   Incisional hernia 05/10/2011   Class 3 severe obesity with serious comorbidity and body mass index (BMI) of 40.0 to 44.9 in adult [facility]) 05/10/2011   Tachycardia 05/08/2011   BLINDNESS, LEGAL, [place] DEFINITION 09/14/2006   Hyperlipemia 09/13/2006   Accelerated hypertension 09/13/2006   MYOCARDIAL INFARCTION, HX OF 09/13/2006   GERD 09/13/2006   Seizure disorder ([place]) 09/13/2006    Conditions to be addressed/monitored: Fall Risk and Physical Therapy  Patient Care Plan: Fall Risk (Adult)     Problem Identified: Fall Risk      Long-Range Goal: Absence of  Fall and Fall-Related Injury   Start Date: 07/25/2020  Expected End Date: 11/22/2020  Priority: High  Note:   Current Barriers:  High Risk for Falls r/t Impaired Gait in patient with COPD, HTN and Uncontrolled DM.  Clinical Goal(s):  Over the next 120 days, patient will not experience falls or require hospitalization d/t fall related injuries.  Interventions:  Collaboration with Delsa Grana, [name] regarding development and update of comprehensive plan of care as evidenced by provider attestation and co-signature Inter-disciplinary care team collaboration (see longitudinal plan of care) Reviewed information regarding safety and fall prevention measures. Advised to continue using an assistive device with all ambulation.  Reviewed for Physical Therapy and attempted to confirm status of referral with the [facility] Team. Pending return call. Will request referral for a new agency if unable to confirm staffing availability with [facility].     Self-Care Deficits/Patient Goals:  Utilize appropriate assistive device with all ambulation Ensure pathways are clear and well lit Use caution when ambulating and change positions slowly Wear secure fitting non skid footwear when ambulating   Follow Up Plan:  Will follow up regarding [place] PT services within the next week.       PLAN A member of the care management team will follow up within the next week.   [name] [facility] Care Management [facility] [phone]

## 2020-09-29 ENCOUNTER — Other Ambulatory Visit: Payer: Self-pay | Admitting: Endocrinology

## 2020-09-30 ENCOUNTER — Other Ambulatory Visit (INDEPENDENT_AMBULATORY_CARE_PROVIDER_SITE_OTHER): Payer: Medicare Other

## 2020-09-30 ENCOUNTER — Other Ambulatory Visit: Payer: Self-pay

## 2020-09-30 DIAGNOSIS — Z794 Long term (current) use of insulin: Secondary | ICD-10-CM | POA: Diagnosis not present

## 2020-09-30 DIAGNOSIS — E1165 Type 2 diabetes mellitus with hyperglycemia: Secondary | ICD-10-CM

## 2020-09-30 LAB — BASIC METABOLIC PANEL
BUN: 17 mg/dL (ref 6–23)
CO2: 30 mEq/L (ref 19–32)
Calcium: 9.1 mg/dL (ref 8.4–10.5)
Chloride: 104 mEq/L (ref 96–112)
Creatinine, Ser: 0.85 mg/dL (ref 0.40–1.20)
GFR: 76.8 mL/min (ref >60.00)
Glucose, Bld: 234 mg/dL — ABNORMAL HIGH (ref 70–99)
Potassium: 4.3 mEq/L (ref 3.5–5.1)
Sodium: 139 mEq/L (ref 135–145)

## 2020-09-30 LAB — HEMOGLOBIN A1C: Hgb A1c MFr Bld: 8.7 % — ABNORMAL HIGH (ref 4.6–6.5)

## 2020-09-30 NOTE — Patient Instructions (Addendum)
  Patient Care Plan: Fall Risk (Adult)     Problem Identified: Fall Risk      Long-Range Goal: Absence of Fall and Fall-Related Injury   Start Date: 07/25/2020  Expected End Date: 11/22/2020  Priority: High  Note:   Current Barriers:  High Risk for Falls r/t Impaired Gait in patient with COPD, HTN and Uncontrolled DM.  Clinical Goal(s):  Over the next 120 days, patient will not experience falls or require hospitalization d/t fall related injuries.  Interventions:  Collaboration with Delsa Grana, [name] regarding development and update of comprehensive plan of care as evidenced by provider attestation and co-signature Inter-disciplinary care team collaboration (see longitudinal plan of care) Reviewed information regarding safety and fall prevention measures. Advised to continue using an assistive device with all ambulation.  Reviewed for Physical Therapy and attempted to confirm status of referral with the [facility] Team. Pending return call. Will request referral for a new agency if unable to confirm staffing availability with [facility].     Self-Care Deficits/Patient Goals:  Utilize appropriate assistive device with all ambulation Ensure pathways are clear and well lit Use caution when ambulating and change positions slowly Wear secure fitting non skid footwear when ambulating   Follow Up Plan:  Will follow up regarding [place] PT services within the next week.      Care Coordination was conducted to assist with Home Health PT services. A member of the care management team will follow up with Ms. Ciccone within the next week.   [name] [facility] Care Management [facility] [phone]

## 2020-10-02 ENCOUNTER — Ambulatory Visit (INDEPENDENT_AMBULATORY_CARE_PROVIDER_SITE_OTHER): Payer: Medicare Other | Admitting: Endocrinology

## 2020-10-02 ENCOUNTER — Encounter: Payer: Self-pay | Admitting: Family Medicine

## 2020-10-02 ENCOUNTER — Other Ambulatory Visit: Payer: Self-pay

## 2020-10-02 VITALS — BP 190/100 | HR 91 | Ht 66.5 in | Wt 254.8 lb

## 2020-10-02 DIAGNOSIS — E1165 Type 2 diabetes mellitus with hyperglycemia: Secondary | ICD-10-CM

## 2020-10-02 DIAGNOSIS — Z794 Long term (current) use of insulin: Secondary | ICD-10-CM

## 2020-10-02 DIAGNOSIS — I10 Essential (primary) hypertension: Secondary | ICD-10-CM

## 2020-10-02 NOTE — Patient Instructions (Addendum)
Bolus for all meals and snacks before eating  Double hydrallazine and Spironolactone  Call before refilling Trulicity

## 2020-10-02 NOTE — Telephone Encounter (Signed)
I called and spoke to patient and informed her that per Dr Ronnie Derby notes from today's visit. She will double up on the hydralazine and spirolactone until seen by her cardiologist. Patient understands

## 2020-10-02 NOTE — Progress Notes (Signed)
Patient ID: [name], female   DOB: 1965/07/19, 55 y.o.   MRN: [id number]          Reason for Appointment: Follow-up for Type 2 Diabetes   History of Present Illness:          Date of diagnosis of type 2 diabetes mellitus:   1989      Background history:   She thinks she was initially treated with diet alone and subsequently given metformin With metformin she had nausea and diarrhea but she does not know if she was treated with any other medications until she was started on insulin in 2003.  Also was on glipizide in 2015 Most likely she was on Levemir or Lantus insulin for quite some time She was then switched to Toujeo in 2016 and although she was initially prescribed 90 units twice a day this was subsequently lowered  Her A1c has been occasionally between 7.2 and 7.5 but has been consistently over 9% since 2018  INSULIN regimen previously: 80 U Toujeo hs, Novolog 20-25 units before meals based on blood sugar     Recent history:     OMNIPOD PUMP settings: Carbohydrate coverage 1: 16 between 6 AM-6 PM and subsequently 1: 150  Basal settings: 12:00 AM 3.45, 6 AM = 3.3  Sensitivity 1: 50 and target 106 during the day  Non-insulin hypoglycemic drugs the patient is taking are: Trulicity 3.0 mg weekly, Farxiga 10 mg daily  Her A1c is up to 8.7, previous range 8.9-12.6  Current management, blood sugar patterns and problems identified: She has generally much higher blood sugars even with using a higher basal rate which she appears to have done on her own from 12 AM to 6 PM, previously was recommended 2.25 during the day also  However she has been fiddling with her carbohydrate ratios and has very odd numbers entered She has used the freestyle libre but has not been able to either use the sensor consistently or take readings very much with only 35% active time  Also in the last 3 days her blood sugars appear to be mostly running in well over 200 consistently  Previously  also did not appear to have any consistent postprandial spikes  This is despite her taking only sporadic small boluses of about 2-3 units at times  Not counting carbohydrates  She says she has taken her Trulicity and [place] regularly  But her weight has gone up  Download indicates sometimes she is not using the pump consistently  Current blood sugar patterns from her meter Blood sugars are mostly higher between 3-6 AM but periodically only higher in the late afternoon and evenings   Side effects from medications have been:metformin caused GI side effects  Glucose monitoring:  done 3+ times a day              Blood Glucose readings recently from her freestyle libre show an average of 198 and only 35% within target range  Previous data:   PRE-MEAL Mornings Midday Evening Overnight Overall  Glucose range:     106-405  Mean/median: 180 208 203 210 199      Meal times are: Bfst 7 AM, 1-2 PM and 5-6 PM                Dietician visit, most recent: 9/18 CDE visit: 01/2020 for pump start  Weight history: Previous range 219-230  Wt Readings from Last 3 Encounters:  10/02/20 254 lb 12.8 oz (115.6 kg)  09/07/20  251 lb (113.9 kg)  09/04/20 251 lb (113.9 kg)    Glycemic control:    Lab Results  Component Value Date   HGBA1C 8.7 (H) 09/30/2020   HGBA1C 7.8 (H) 06/10/2020   HGBA1C 9.1 (H) 01/30/2020   Lab Results  Component Value Date   MICROALBUR 20.3 (H) 09/21/2019   LDLCALC 93 06/10/2020   CREATININE 0.85 09/30/2020   Lab Results  Component Value Date   MICRALBCREAT 21.8 09/21/2019    Lab Results  Component Value Date   FRUCTOSAMINE 255 04/09/2020   FRUCTOSAMINE 298 (H) 01/30/2020   FRUCTOSAMINE CANCELED 07/26/2015      Allergies as of 10/02/2020       Reactions   Codeine Swelling, Other (See Comments)   Swelling and burning of mouth (inside)   Lactose Intolerance (gi) Nausea And Vomiting        Medication List        Accurate as of October 02, 2020  4:22 PM. If you have any questions, ask your nurse or doctor.          Accu-Chek Guide test strip Generic drug: glucose blood Use as instructed to check blood sugar 4 times per day Dx code E11.9   albuterol 1.25 MG/3ML nebulizer solution Commonly known as: ACCUNEB Inhale 1 ampule into the lungs every 6 (six) hours as needed for wheezing or shortness of breath. What changed: Another medication with the same name was changed. Make sure you understand how and when to take each.   albuterol 108 (90 Base) MCG/ACT inhaler Commonly known as: ProAir HFA INHALE 2 PUFFS BY MOUTH EVERY 6 HOURS AS NEEDED FOR WHEEZING OR SHORTNESS OF BREATH What changed:  how much to take how to take this when to take this reasons to take this additional instructions   amLODipine 10 MG tablet Commonly known as: NORVASC Take 1 tablet (10 mg total) by mouth daily.   baclofen 10 MG tablet Commonly known as: LIORESAL Take 1 tablet (10 mg total) by mouth 3 (three) times daily as needed for muscle spasms.   budesonide 0.5 MG/2ML nebulizer solution Commonly known as: PULMICORT Inhale 0.5 mg into the lungs daily.   carvedilol 12.5 MG tablet Commonly known as: COREG Take 12.5 mg by mouth 2 (two) times daily with a meal.   cholecalciferol 25 MCG (1000 UNIT) tablet Commonly known as: VITAMIN D3 Take 1,000 Units by mouth daily.   clopidogrel 75 MG tablet Commonly known as: PLAVIX Take 1 tablet (75 mg total) by mouth daily.   cycloSPORINE 0.05 % ophthalmic emulsion Commonly known as: RESTASIS Place 2 drops into both eyes 2 (two) times daily.   dexamethasone 1 MG tablet Commonly known as: DECADRON Take at 11 pm the night prior to your blood draw.   DULoxetine 20 MG capsule Commonly known as: CYMBALTA TAKE 2 CAPSULES EVERY MORNING AND 1 CAPSULE EVERY NIGHT.   EPINEPHrine 0.3 mg/0.3 mL Soaj injection Commonly known as: EPI-PEN Inject 0.3 mg into the muscle as needed for anaphylaxis.    ezetimibe 10 MG tablet Commonly known as: Zetia Take 1 tablet (10 mg total) by mouth daily.   Farxiga 10 MG Tabs tablet Generic drug: dapagliflozin propanediol TAKE 1 TABLET BY MOUTH EVERY DAY   fluticasone 50 MCG/ACT nasal spray Commonly known as: FLONASE Place 2 sprays into both nostrils daily.   gabapentin 600 MG tablet Commonly known as: Neurontin 600 mg in the morning, 600 mg in the afternoon, 1200 mg nightly. What changed:  how  much to take how to take this when to take this additional instructions   hydrALAZINE 25 MG tablet Commonly known as: APRESOLINE Take 1 tablet (25 mg total) by mouth every 8 (eight) hours.   hydrochlorothiazide 25 MG tablet Commonly known as: HYDRODIURIL Take 1 tablet (25 mg total) by mouth daily.   insulin lispro 100 UNIT/ML injection Commonly known as: HumaLOG USE UP TO 150 UNITS/DAY IN INSULIN PUMP   levETIRAcetam 500 MG tablet Commonly known as: KEPPRA Take 500-750 mg by mouth See admin instructions. Take 1 tablet (555m) by mouth every morning and take 1 tablets (7560m by mouth every night   losartan 100 MG tablet Commonly known as: COZAAR Take 1 tablet (100 mg total) by mouth daily.   mirabegron ER 50 MG Tb24 tablet Commonly known as: Myrbetriq Take 1 tablet (50 mg total) by mouth daily.   nitrofurantoin 50 MG capsule Commonly known as: MACRODANTIN Take 50 mg by mouth at bedtime.   nitroGLYCERIN 0.4 MG SL tablet Commonly known as: NITROSTAT Place 0.4 mg under the tongue every 5 (five) minutes as needed for chest pain.   Omnipod DASH Pods (Gen 4) Misc Change pod every 2 days   pantoprazole 40 MG tablet Commonly known as: PROTONIX Take 1 tablet (40 mg total) by mouth daily.   polyethylene glycol 17 g packet Commonly known as: MIRALAX / GLYCOLAX Take 17 g by mouth daily as needed for mild constipation.   potassium chloride SA 20 MEQ tablet Commonly known as: Klor-Con M20 You have not needed potassium while in the  hospital.  Hold until outpatient followup. What changed:  how much to take how to take this when to take this additional instructions   simvastatin 20 MG tablet Commonly known as: ZOCOR Take 20 mg by mouth daily.   spironolactone 25 MG tablet Commonly known as: ALDACTONE Take 25 mg by mouth daily.   terconazole 0.4 % vaginal cream Commonly known as: TERAZOL 7 Place 1 applicator vaginally at bedtime as needed (irritation).   Trulicity 3 MGAV/4.0JWopn Generic drug: Dulaglutide Inject 3 mg into the skin once a week. What changed: when to take this   Wixela Inhub 250-50 MCG/ACT Aepb Generic drug: fluticasone-salmeterol Inhale 1 puff into the lungs 2 (two) times daily.        Allergies:  Allergies  Allergen Reactions   Codeine Swelling and Other (See Comments)    Swelling and burning of mouth (inside)   Lactose Intolerance (Gi) Nausea And Vomiting    Past Medical History:  Diagnosis Date   Anxiety and depression 09/13/2006   Qualifier: Diagnosis of  By: MaHassell DoneNP, Nykedtra     Arthritis    joint pain    Asthma    COPD (chronic obstructive pulmonary disease) ([facility]   chronic bronchitis    Depression    Diabetes mellitus    since age 89type 2 IDDM   Diabetic neuropathy, painful ([facility]   FEET AND HANDS   Diabetic retinopathy ([facility]   Diastolic dysfunction    a.  Echo 11/17: EF 60-65%, mild LVH, no RWMA, Gr1DD, mild AI, dilated aortic root measuring 38 mm, mildly dilated ascending aorta   Dilated aortic root (HCC)    3829my echo 11/2015   Dizziness    secondary to diabetes and hypertension    Epilepsy idiopathic petit mal ([facility]  last seizure 2012;controlled w/ topomax   GERD (gastroesophageal reflux disease)    Glaucoma    NOT  ON ANY EYE DROPS    Headache(784.0)    migraines    Heart murmur    born with    History of stress test    a. 11/17: Normal perfusion, EF 53%, normal study   Hx of blood clots    hematomas removed from left side of brain from  45mo to 89yrold    Hyperlipidemia    Hypertension    Legally blind    left eye    MIGRAINE HEADACHE 09/14/2006   Qualifier: Diagnosis of  By: MaHassell DoneNP, Nykedtra     Myocardial infarction ([facility]   a.  Patient reported history of without objective documentation   Nephrolithiasis    frequent urination , urination at nite  PT SEEN IN ER 09/22/11 FOR BACK AND RT SIDED PAIN--HAS KNOWN STONE RT URETER AND UA IN ER SHOWED UTI   Pain 09/23/11   LOWER BACK AND RIGHT SIDE--PT HAS RIGHT URETERAL STONE   Pneumonia    hx of 2009   Pyelonephritis    Seizures ([facility]   [facility] ap[facility]    [facility] ; mild   Stress incontinence    Stroke ([facility] [place]   last 2003  RESIDUAL LEFT LEG WEAKNESS--NO OTHER RESIDUAL PROBLEMS    Past Surgical History:  Procedure Laterality Date   BRAIN HEMATOMA EVACUATION     five procedures total, first procedure when 893 monthsld, last at [age]ears of age.   CYSTOSCOPY W/ RETROGRADES  09/24/2011   Procedure: CYSTOSCOPY WITH RETROGRADE PYELOGRAM;  Surgeon: JoMalka SoMD;  Location: [facility];  Service: Urology;  Laterality: Right;   CYSTOSCOPY WITH URETEROSCOPY  09/24/2011   Procedure: CYSTOSCOPY WITH URETEROSCOPY;  Surgeon: JoMalka SoMD;  Location: [facility];  Service: Urology;  Laterality: Right;  Balloon dilation right ureter    CYSTOSCOPY/RETROGRADE/URETEROSCOPY  08/24/2011   Procedure: CYSTOSCOPY/RETROGRADE/URETEROSCOPY;  Surgeon: DaMolli HazardMD;  Location: [facility];  Service: Urology;  Laterality: Right;  Cysto, Right retrograde Pyelogram, right stent placement.    INCISIONAL HERNIA REPAIR  05/05/2011   Procedure: LAPAROSCOPIC INCISIONAL HERNIA;  Surgeon: MaRolm BookbinderMD;  [facility] Service: General;  Laterality: N/A;   kidney stone removal     MASS EXCISION  05/05/2011   Procedure: EXCISION MASS;  Surgeon: MaRolm BookbinderMD;  [facility] Service: General;  Laterality: Right;   [facility]     RETINAL DETACHMENT  SURGERY Left 1990   RIGHT/LEFT HEART CATH AND CORONARY ANGIOGRAPHY N/A 04/28/2019   Procedure: RIGHT/LEFT HEART CATH AND CORONARY ANGIOGRAPHY;  Surgeon: CaYolonda KidaMD;  Location: [facility];  Service: Cardiovascular;  Laterality: N/A;   SHUNT REMOVAL     [place]unt [place] at age 35 92[place] 55  [place]  Family History  Problem Relation Age of Onset   Uterine cancer Mother    Hypertension Mother    Cancer Mother    Brain cancer Maternal Grandmother    Hypertension Maternal Grandmother    Birth defects Daughter    Hypertension Daughter    Cirrhosis Maternal Grandfather    ADD / ADHD Maternal Grandfather    Birth defects Maternal Grandfather    Diabetes Maternal Grandfather    Anesthesia problems Neg Hx    Colon cancer Neg Hx    Esophageal cancer Neg Hx    Pancreatic cancer Neg Hx     Social History:  reports that she quit smoking about 33 years ago. Her smoking use included cigarettes. She has a 15.00 pack-year smoking history. She has never used smokeless tobacco. She reports current alcohol use of about 1.0 standard drink per week. She reports that she does not use drugs.   Review of Systems   Lipid history: Is being treated with simvastatin 40 mg daily    Lab Results  Component Value Date   CHOL 169 06/10/2020   HDL 41.10 06/10/2020   LDLCALC 93 06/10/2020   TRIG 172.0 (H) 06/10/2020   CHOLHDL 4 06/10/2020           Hypertension: She appears to have severe hypertension which is more recently poorly controlled She says she has been managed by her cardiologist on a regular basis and has been getting medications increased or added  Currently taking Coreg, Losartan 100 mg, hydralazine 25 mg 3 times daily, amlodipine, aldactone 25 mg, HCTZ per cardiologist  Home BP 190/101 last night  She says she has been told to collect 24-hour urine for adrenal hormones but she has not done so She does have an adrenal adenoma Previously in  the hospital her aldosterone level was normal  BP Readings from Last 3 Encounters:  10/02/20 (!) 190/100  09/08/20 (!) 200/92  09/04/20 (!) 182/110   Hypokalemia: She has been given  25 mg spironolactone for history of hypokalemia and no potassium supplements  Lab Results  Component Value Date   K 4.3 09/30/2020    Most recent eye exam was in 8/21  Most recent foot exam: 1/61  Diabetic complications: History of neuropathy, microalbuminuria and background retinopathy  She will occasionally take prednisone for her asthma  LABS:  Lab on 09/30/2020  Component Date Value Ref Range Status   Sodium 09/30/2020 139  135 - 145 mEq/L Final   Potassium 09/30/2020 4.3  3.5 - 5.1 mEq/L Final   Chloride 09/30/2020 104  96 - 112 mEq/L Final   CO2 09/30/2020 30  19 - 32 mEq/L Final   Glucose, Bld 09/30/2020 234 (A) 70 - 99 mg/dL Final   BUN 09/30/2020 17  6 - 23 mg/dL Final   Creatinine, Ser 09/30/2020 0.85  0.40 - 1.20 mg/dL Final   GFR 09/30/2020 76.80  >60.00 mL/min Final   Calculated using the CKD-EPI Creatinine Equation (2021)   Calcium 09/30/2020 9.1  8.4 - 10.5 mg/dL Final   Hgb A1c MFr Bld 09/30/2020 8.7 (A) 4.6 - 6.5 % Final   Glycemic Control Guidelines for People with Diabetes:Non Diabetic:  <6%Goal of Therapy: <7%Additional Action Suggested:  >8%     Physical Examination:  BP (!) 190/100 (Cuff Size: Large)   Pulse 91   Ht 5' 6.5" (1.689 m)   Wt 254 lb 12.8 oz (115.6 kg)   LMP  (LMP Unknown)   SpO2 99%   BMI 40.51 kg/m       ASSESSMENT:  Diabetes type 2, uncontrolled, insulin-dependent  See history of present illness for detailed discussion of current diabetes management, blood sugar patterns and problems identified     Her A1c is 8.7 and higher than usual   She is on insulin pump since 1/22 using Humalog with Dash OmniPod pump Also on Farxiga and Trulicity 3 mg weekly  Her blood sugars were assessed from the freestyle libre but the data is very  incomplete  She has significantly high readings despite now taking 80 units basal However she is getting only 2 to 3 units of bolus insulin  and only sporadically She tries to change her settings on the pump on her own and some of these are inappropriate  Hypertension: She has severe hypertension and is being evaluated elsewhere for secondary hypertension In the meantime she does need more aggressive medications   PLAN:    She was explained her blood sugar patterns and need for using the pump consistently Her carbohydrate coverage will be 1: 5 and she will try to enter the actual carbohydrates for every meal and snack Also if her blood sugars are high in between meals he will take correction doses Correction factor will be 1: 25 with target 120 BASAL rates will be increased to 4 units overnight and 3.7 at 6 AM for now She will let us know when she finishes the 3 mg Trulicity and will go up to 4.5 Stay on Farxiga Consider consultation with diabetes educator Needs to use her freestyle libre consistently with monitoring 4 times a day at least   She will proceed with evaluation of her adrenal hormones as recommended elsewhere  For now she will go up to 50 mg on hydralazine and also 50 mg and spironolactone until seen by cardiologist  Patient Instructions  Bolus for all meals and snacks before eating  Double hydrallazine and Spironolactone  Call before refilling Trulicity   Total visit time including counseling = 30 minutes  Elayne Snare 10/02/2020, 4:22 PM   Note: This office note was prepared with Dragon voice recognition system technology. Any transcriptional errors that result from this process are unintentional.

## 2020-10-03 ENCOUNTER — Other Ambulatory Visit: Payer: Self-pay | Admitting: Endocrinology

## 2020-10-03 DIAGNOSIS — Z794 Long term (current) use of insulin: Secondary | ICD-10-CM

## 2020-10-03 DIAGNOSIS — E1165 Type 2 diabetes mellitus with hyperglycemia: Secondary | ICD-10-CM

## 2020-10-03 LAB — MICROALBUMIN / CREATININE URINE RATIO
Creatinine,U: 128.5 mg/dL
Microalb Creat Ratio: 21.4 mg/g (ref 0.0–30.0)
Microalb, Ur: 27.6 mg/dL — ABNORMAL HIGH (ref 0.0–1.9)

## 2020-10-03 NOTE — Addendum Note (Signed)
Addended by: Kaylyn Lim I on: 10/03/2020 08:55 AM   Modules accepted: Orders

## 2020-10-07 ENCOUNTER — Ambulatory Visit: Payer: Medicare Other | Admitting: Surgery

## 2020-10-08 ENCOUNTER — Telehealth: Payer: Self-pay | Admitting: Endocrinology

## 2020-10-08 NOTE — Telephone Encounter (Signed)
Patient called to advise that it is about time for her to refill her Trulicity and that Dr Dwyane Dee asked her to call so that he could send in a updated RX (new dosage) to [facility] [address] [facility] [place]

## 2020-10-14 ENCOUNTER — Other Ambulatory Visit: Payer: Self-pay | Admitting: Family Medicine

## 2020-10-15 ENCOUNTER — Encounter: Payer: Self-pay | Admitting: Student in an Organized Health Care Education/Training Program

## 2020-10-15 ENCOUNTER — Encounter: Payer: Self-pay | Admitting: Family Medicine

## 2020-10-15 ENCOUNTER — Ambulatory Visit: Payer: Medicare Other | Admitting: Family Medicine

## 2020-10-15 NOTE — Telephone Encounter (Signed)
Requested medication (s) are due for refill today:   Yes  Requested medication (s) are on the active medication list:   Yes  Future visit scheduled:   Yes in 4 wks with [name]   Last ordered: 03/06/2020 45 g,  0 refills for vaginal irritation.   Pt requesting a refill  Returned because no protocol assigned to this medication.   Requested Prescriptions  Pending Prescriptions Disp Refills   terconazole (TERAZOL 7) 0.4 % vaginal cream [Pharmacy Med Name: TERCONAZOLE 0.4% CREAM] 45 g 0    Sig: Place 1 applicator vaginally at bedtime as needed (irritation).     Off-Protocol Failed - 10/14/2020  6:20 PM      Failed - Medication not assigned to a protocol, review manually.      Passed - Valid encounter within last 12 months    Recent Outpatient Visits           1 month ago OSA (obstructive sleep apnea)   [place], DO   3 months ago Accelerated hypertension   [place], DO   4 months ago Acute cystitis with hematuria   [facility] Kathrine Haddock, NP   10 months ago PAD (peripheral artery disease) [facility])   [facility] Delsa Grana, [name]   1 year ago Encounter for screening mammogram for malignant neoplasm of breast   [place], MD       Future Appointments             In 4 weeks Delsa Grana, [name] [facility] [place]   In 4 months  [facility], [facility]

## 2020-10-15 NOTE — Telephone Encounter (Signed)
Pt following up on a call 9/20 about refilling her prescription for Trulicity and dosage change. [facility]. Pt contact: [phone]  Please d/c old insulin order. Pt no longer takes that.

## 2020-10-17 ENCOUNTER — Telehealth: Payer: Self-pay

## 2020-10-17 NOTE — Telephone Encounter (Signed)
Pt has been changed to a virtual for tomorrow Copied from [place] [phone]. Topic: General - Other >> Oct 17, 2020 10:07 AM Leward Quan A wrote: Reason for CRM: Patient called in and scheduled an appointment to be seen for her medication refill. Patient was not understanding why she have to come in the office to be seen since she was seen a month ago also caring for her 56 yr old grandson that is Autistic and does not have anyone to care for him since he is out of school. Patient willing to do the appointment but asking if it can be by phone since she have to find a ride and a sitter. Please call patient to advise Ph#   [phone]

## 2020-10-17 NOTE — Telephone Encounter (Signed)
Yes can be virtual only for particular problem no add ons, please schedule

## 2020-10-18 ENCOUNTER — Telehealth (INDEPENDENT_AMBULATORY_CARE_PROVIDER_SITE_OTHER): Payer: Medicare Other | Admitting: Family Medicine

## 2020-10-18 ENCOUNTER — Encounter: Payer: Self-pay | Admitting: Family Medicine

## 2020-10-18 VITALS — Ht 66.5 in | Wt 254.0 lb

## 2020-10-18 DIAGNOSIS — T3695XA Adverse effect of unspecified systemic antibiotic, initial encounter: Secondary | ICD-10-CM

## 2020-10-18 DIAGNOSIS — E785 Hyperlipidemia, unspecified: Secondary | ICD-10-CM | POA: Diagnosis not present

## 2020-10-18 DIAGNOSIS — I10 Essential (primary) hypertension: Secondary | ICD-10-CM

## 2020-10-18 DIAGNOSIS — E1165 Type 2 diabetes mellitus with hyperglycemia: Secondary | ICD-10-CM

## 2020-10-18 DIAGNOSIS — J449 Chronic obstructive pulmonary disease, unspecified: Secondary | ICD-10-CM

## 2020-10-18 DIAGNOSIS — N760 Acute vaginitis: Secondary | ICD-10-CM

## 2020-10-18 DIAGNOSIS — B373 Candidiasis of vulva and vagina: Secondary | ICD-10-CM

## 2020-10-18 DIAGNOSIS — Z794 Long term (current) use of insulin: Secondary | ICD-10-CM

## 2020-10-18 DIAGNOSIS — B379 Candidiasis, unspecified: Secondary | ICD-10-CM

## 2020-10-18 DIAGNOSIS — E1169 Type 2 diabetes mellitus with other specified complication: Secondary | ICD-10-CM | POA: Diagnosis not present

## 2020-10-18 DIAGNOSIS — B3731 Acute candidiasis of vulva and vagina: Secondary | ICD-10-CM

## 2020-10-18 DIAGNOSIS — Z1231 Encounter for screening mammogram for malignant neoplasm of breast: Secondary | ICD-10-CM

## 2020-10-18 MED ORDER — FLUCONAZOLE 150 MG PO TABS: 150.0000 mg | ORAL_TABLET | ORAL | 1 refills | Status: DC | PRN

## 2020-10-18 MED ORDER — TERCONAZOLE 0.4 % VA CREA: 1.0000 | TOPICAL_CREAM | Freq: Every day | VAGINAL | 2 refills | Status: AC

## 2020-10-18 NOTE — Progress Notes (Signed)
Name: [name]   MRN: [id number]    DOB: 06/08/1965   Date:10/18/2020       Progress Note  Subjective:    Chief Complaint  Chief Complaint  Patient presents with   Follow-up   Medication Refill    I connected with  [name] on 10/18/20 at  1:20 PM EDT by telephone and verified that I am speaking with the correct person using two identifiers.   I discussed the limitations, risks, security and privacy concerns of performing an evaluation and management service by telephone and the availability of in person appointments. Staff also discussed with the patient that there may be a patient responsible charge related to this service.  Patient verbalized understanding and agreed to proceed with encounter. Patient Location: hom Provider Location: [facility] Additional Individuals present: none  HPI Pt presents for refill on terconazole for vulvovaginitis She is prone to yeast infections esp when blood sugars have been high, and they have been high recently She has been on terconazole cream/vaginal suppository for many year (2015) 7 d tx, usually works She is currently having vulvovaginal irritation and itching No abd pain or urinary sx, feels like her recurrent yeast infections     Patient Active Problem List   Diagnosis Date Noted   Weakness of right arm    SBO (small bowel obstruction) ([place])    Acute cystitis with hematuria    Right sided weakness    Adrenal adenoma, right    Nausea & vomiting 06/13/2020   Ileus ([place]) 06/13/2020   PAD (peripheral artery disease) ([address]) 01/24/2020   Leg pain 01/24/2020   Leg pain, bilateral 01/11/2020   Numbness and tingling 01/11/2020   Abnormal ankle brachial index (ABI) 12/18/2019   Hypoglycemia 05/17/2019   Hypoglycemia associated with type 2 diabetes mellitus ([place]) 05/16/2019   Chest pain 04/27/2019   Type 2 diabetes mellitus without complication, with long-term current use of insulin ([place]) 04/27/2019   COPD  (chronic obstructive pulmonary disease) ([place]) 04/27/2019   CAD (coronary artery disease) 04/27/2019   H/O: stroke with residual effects    Myalgia 04/19/2019   Central pain syndrome 04/19/2019   Non-intractable vomiting 03/01/2019   History of CVA (cerebrovascular accident) 07/08/2018   History of seizure 07/08/2018   Diabetic polyneuropathy associated with type 2 diabetes mellitus ([place]) 03/02/2018   Obesity (BMI 30-39.9) 03/02/2018   Vitamin D deficiency 01/24/2018   Vulvovaginal candidiasis 01/24/2018   Hypokalemia 06/08/2017   Overactive bladder 05/18/2017   Asthma 06/22/2016   Dilated aortic root ([place])    Sacroiliac dysfunction 10/09/2014   Chronic low back pain 09/03/2014   CVA, old, hemiparesis ([place]) 09/03/2014   OSA (obstructive sleep apnea) 05/15/2014   Restrictive airway disease 05/15/2014   Colitis 02/22/2014   Type 2 diabetes mellitus with hyperlipidemia ([place]) 01/21/2014   Chronic pain syndrome 01/21/2014   Headache disorder 09/07/2013   Diabetic neuropathy ([place]) 05/23/2013   Mixed urge and stress incontinence 08/25/2011   Ureteral stricture, right 08/25/2011   Pyelonephritis 08/22/2011   Nephrolithiasis 08/22/2011   Incisional hernia 05/10/2011   Class 3 severe obesity with serious comorbidity and body mass index (BMI) of 40.0 to 44.9 in adult ([place]) 05/10/2011   Tachycardia 05/08/2011   BLINDNESS, LEGAL, [place] DEFINITION 09/14/2006   Hyperlipemia 09/13/2006   Accelerated hypertension 09/13/2006   MYOCARDIAL INFARCTION, HX OF 09/13/2006   GERD 09/13/2006   Seizure disorder ([place]) 09/13/2006    Social History   Tobacco Use   Smoking  status: Former    Packs/day: 1.00    Years: 15.00    Pack years: 15.00    Types: Cigarettes    Quit date: 01/20/1987    Years since quitting: 33.7   Smokeless tobacco: Never   Tobacco comments:    quit more than 20 years  Substance Use Topics   Alcohol use: Yes    Alcohol/week: 1.0 standard drink    Types: 1 Glasses of wine  per week     Current Outpatient Medications:    Accu-Chek Softclix Lancets lancets, 4 (four) times daily., Disp: , Rfl:    albuterol (ACCUNEB) 1.25 MG/3ML nebulizer solution, Inhale 1 ampule into the lungs every 6 (six) hours as needed for wheezing or shortness of breath., Disp: , Rfl:    albuterol (PROAIR HFA) 108 (90 Base) MCG/ACT inhaler, INHALE 2 PUFFS BY MOUTH EVERY 6 HOURS AS NEEDED FOR WHEEZING OR SHORTNESS OF BREATH (Patient taking differently: Inhale 2 puffs into the lungs every 6 (six) hours as needed for wheezing or shortness of breath.), Disp: 3 Inhaler, Rfl: 3   amLODipine (NORVASC) 5 MG tablet, Take 5 mg by mouth daily., Disp: , Rfl:    baclofen (LIORESAL) 10 MG tablet, Take 1 tablet (10 mg total) by mouth 3 (three) times daily as needed for muscle spasms., Disp: 90 tablet, Rfl: 0   budesonide (PULMICORT) 0.5 MG/2ML nebulizer solution, Inhale 0.5 mg into the lungs daily., Disp: , Rfl:    carvedilol (COREG) 12.5 MG tablet, Take 12.5 mg by mouth 2 (two) times daily with a meal., Disp: , Rfl:    cholecalciferol (VITAMIN D3) 25 MCG (1000 UNIT) tablet, Take 1,000 Units by mouth daily., Disp: , Rfl:    cloNIDine (CATAPRES) 0.1 MG tablet, Take 0.1 mg by mouth daily., Disp: , Rfl:    clopidogrel (PLAVIX) 75 MG tablet, Take 1 tablet (75 mg total) by mouth daily., Disp: 90 tablet, Rfl: 0   cycloSPORINE (RESTASIS) 0.05 % ophthalmic emulsion, Place 2 drops into both eyes 2 (two) times daily., Disp: , Rfl:    dexamethasone (DECADRON) 1 MG tablet, Take at 11 pm the night prior to your blood draw., Disp: 1 tablet, Rfl: 0   Dulaglutide (TRULICITY) 3 YJ/8.5UD SOPN, Inject 3 mg into the skin once a week. (Patient taking differently: Inject 3 mg into the skin every Friday.), Disp: 2 mL, Rfl: 2   DULoxetine (CYMBALTA) 20 MG capsule, TAKE 2 CAPSULES EVERY MORNING AND 1 CAPSULE EVERY NIGHT., Disp: 270 capsule, Rfl: 1   EPINEPHrine 0.3 mg/0.3 mL IJ SOAJ injection, Inject 0.3 mg into the muscle as needed  for anaphylaxis., Disp: , Rfl:    ezetimibe (ZETIA) 10 MG tablet, Take 1 tablet (10 mg total) by mouth daily., Disp: 90 tablet, Rfl: 3   FARXIGA 10 MG TABS tablet, TAKE 1 TABLET BY MOUTH EVERY DAY, Disp: 90 tablet, Rfl: 1   fluticasone (FLONASE) 50 MCG/ACT nasal spray, Place 2 sprays into both nostrils daily., Disp: 16 g, Rfl: 3   gabapentin (NEURONTIN) 600 MG tablet, 600 mg in the morning, 600 mg in the afternoon, 1200 mg nightly. (Patient taking differently: Take 600-1,200 mg by mouth See admin instructions. Take 1 tablet (67m) by mouth every morning and afternoon and then take 2 tablets (12085m by mouth every night), Disp: 120 tablet, Rfl: 2   glucose blood (ACCU-CHEK GUIDE) test strip, Use as instructed to check blood sugar 4 times per day Dx code E11.9, Disp: 400 strip, Rfl: 3   Insulin Disposable  Pump (OMNIPOD DASH PODS, GEN 4,) MISC, Change pod every 2 days, Disp: 15 each, Rfl: 3   insulin lispro (HUMALOG) 100 UNIT/ML injection, USE UP TO 150 UNITS/DAY IN INSULIN PUMP, Disp: 90 mL, Rfl: 2   levETIRAcetam (KEPPRA) 500 MG tablet, Take 500-750 mg by mouth See admin instructions. Take 1 tablet (5106m) by mouth every morning and take 1 tablets (7532m by mouth every night, Disp: , Rfl:    losartan (COZAAR) 100 MG tablet, Take 1 tablet (100 mg total) by mouth daily., Disp: 90 tablet, Rfl: 3   mirabegron ER (MYRBETRIQ) 50 MG TB24 tablet, Take 1 tablet (50 mg total) by mouth daily., Disp: 90 tablet, Rfl: 1   nitrofurantoin (MACRODANTIN) 50 MG capsule, Take 50 mg by mouth at bedtime., Disp: , Rfl:    nitroGLYCERIN (NITROSTAT) 0.4 MG SL tablet, Place 0.4 mg under the tongue every 5 (five) minutes as needed for chest pain., Disp: , Rfl:    pantoprazole (PROTONIX) 40 MG tablet, Take 1 tablet (40 mg total) by mouth daily., Disp: 90 tablet, Rfl: 0   polyethylene glycol (MIRALAX / GLYCOLAX) 17 g packet, Take 17 g by mouth daily as needed for mild constipation., Disp: 100 each, Rfl: 3   potassium chloride  SA (KLOR-CON M20) 20 MEQ tablet, You have not needed potassium while in the hospital.  Hold until outpatient followup. (Patient taking differently: Take 20 mEq by mouth 2 (two) times daily.), Disp: 60 tablet, Rfl: 5   simvastatin (ZOCOR) 20 MG tablet, Take 20 mg by mouth daily., Disp: , Rfl:    spironolactone (ALDACTONE) 25 MG tablet, Take 25 mg by mouth daily., Disp: , Rfl:    terconazole (TERAZOL 7) 0.4 % vaginal cream, Place 1 applicator vaginally at bedtime as needed (irritation)., Disp: 45 g, Rfl: 0   WIXELA INHUB 250-50 MCG/DOSE AEPB, Inhale 1 puff into the lungs 2 (two) times daily., Disp: , Rfl:    amLODipine (NORVASC) 10 MG tablet, Take 1 tablet (10 mg total) by mouth daily., Disp: 90 tablet, Rfl: 2   hydrALAZINE (APRESOLINE) 25 MG tablet, Take 1 tablet (25 mg total) by mouth every 8 (eight) hours., Disp: 90 tablet, Rfl: 2   hydrochlorothiazide (HYDRODIURIL) 25 MG tablet, Take 1 tablet (25 mg total) by mouth daily., Disp: 90 tablet, Rfl: 2  Allergies  Allergen Reactions   Codeine Swelling and Other (See Comments)    Swelling and burning of mouth (inside)   Lactose Intolerance (Gi) Nausea And Vomiting    Chart Review: I personally reviewed active problem list, medication list, allergies, family history, social history, health maintenance, notes from last encounter, lab results, imaging with the patient/caregiver today.   Review of Systems All other Systems reviewed and are negative for acute change except as noted in the HPI.   Objective:    Virtual encounter, vitals limited, only able to obtain the following Today's Vitals   10/18/20 0939  Weight: 254 lb (115.2 kg)  Height: 5' 6.5" (1.689 m)  PainSc: 0-No pain   Body mass index is 40.38 kg/m. Nursing Note and Vital Signs reviewed.  Physical Exam Phonation clear, pt alert, pleasant PE limited by telephone encounter  No results found for this or any previous visit (from the past 72 hour(s)).  Assessment and Plan:      ICD-10-CM   1. Vulvovaginal candidiasis  B37.3 terconazole (TERAZOL 7) 0.4 % vaginal cream    fluconazole (DIFLUCAN) 150 MG tablet    2. Uncontrolled type 2 diabetes mellitus with  hyperglycemia, with long-term current use of insulin (HCC)  E11.65 terconazole (TERAZOL 7) 0.4 % vaginal cream   Z79.4 fluconazole (DIFLUCAN) 150 MG tablet    3. Uncontrolled hypertension  I10     4. Antibiotic-induced yeast infection  B37.9 terconazole (TERAZOL 7) 0.4 % vaginal cream   T36.95XA fluconazole (DIFLUCAN) 150 MG tablet    5. Recurrent vaginitis  N76.0 terconazole (TERAZOL 7) 0.4 % vaginal cream    6. Breast cancer screening by mammogram  Z12.31 MM Digital Screening     Can use diflucan if suppository does not effectively tx sx, if sx continue pt understands she will need an exam and some testing prior to any other Rx or treatments  - I discussed the assessment and treatment plan with the patient. The patient was provided an opportunity to ask questions and all were answered. The patient agreed with the plan and demonstrated an understanding of the instructions.  - The patient was advised to call back or seek an in-person evaluation if the symptoms worsen or if the condition fails to improve as anticipated.  I provided 10 minutes of non-face-to-face time during this encounter.  Delsa Grana, [name] 10/18/20 1:31 PM

## 2020-10-22 ENCOUNTER — Ambulatory Visit
Payer: Medicare Other | Attending: Student in an Organized Health Care Education/Training Program | Admitting: Student in an Organized Health Care Education/Training Program

## 2020-10-22 ENCOUNTER — Other Ambulatory Visit: Payer: Self-pay

## 2020-10-22 ENCOUNTER — Encounter: Payer: Self-pay | Admitting: Student in an Organized Health Care Education/Training Program

## 2020-10-22 DIAGNOSIS — E1142 Type 2 diabetes mellitus with diabetic polyneuropathy: Secondary | ICD-10-CM

## 2020-10-22 MED ORDER — TRULICITY 4.5 MG/0.5ML ~~LOC~~ SOAJ: 4.5000 mg | SUBCUTANEOUS | 3 refills | Status: DC

## 2020-10-22 MED ORDER — INSULIN LISPRO 100 UNIT/ML IJ SOLN: INTRAMUSCULAR | 3 refills | Status: DC

## 2020-10-22 NOTE — Telephone Encounter (Signed)
Pt needs a refill of Humalog and Trulicity, pt has no medication left, to [facility] in [place], [place]. Pt contact [phone]

## 2020-10-22 NOTE — Progress Notes (Signed)
I attempted to call the patient however no response. Voicemail left instructing patient to call front desk office at [phone] to reschedule appointment.  I recommend patient come in for a face-to-face evaluation. -Dr Holley Raring

## 2020-10-22 NOTE — Telephone Encounter (Signed)
Taken care of on a different encounter.

## 2020-10-23 ENCOUNTER — Other Ambulatory Visit: Payer: Self-pay

## 2020-10-23 ENCOUNTER — Ambulatory Visit
Payer: Medicare Other | Attending: Student in an Organized Health Care Education/Training Program | Admitting: Student in an Organized Health Care Education/Training Program

## 2020-10-23 DIAGNOSIS — M791 Myalgia, unspecified site: Secondary | ICD-10-CM

## 2020-10-23 DIAGNOSIS — G894 Chronic pain syndrome: Secondary | ICD-10-CM | POA: Diagnosis not present

## 2020-10-23 DIAGNOSIS — G890 Central pain syndrome: Secondary | ICD-10-CM | POA: Diagnosis not present

## 2020-10-23 DIAGNOSIS — E1142 Type 2 diabetes mellitus with diabetic polyneuropathy: Secondary | ICD-10-CM | POA: Diagnosis not present

## 2020-10-23 MED ORDER — AMITRIPTYLINE HCL 25 MG PO TABS: ORAL_TABLET | ORAL | 0 refills | Status: DC

## 2020-10-23 NOTE — Progress Notes (Signed)
Patient: [name]  Service Category: E/M  Provider: Gillis Santa, MD  DOB: [birth year]  DOS: 10/23/2020  Location: Office  MRN: [id number]  Setting: Ambulatory outpatient  Referring Provider: Delsa Grana, [name]  Type: Established Patient  Specialty: Interventional Pain Management  PCP: Delsa Grana, [name]  Location: Home  Delivery: TeleHealth     Virtual Encounter - Pain Management PROVIDER NOTE: Information contained herein reflects review and annotations entered in association with encounter. Interpretation of such information and data should be left to medically-trained personnel. Information provided to patient can be located elsewhere in the medical record under "Patient Instructions". Document created using STT-dictation technology, any transcriptional errors that may result from process are unintentional.    Contact & Pharmacy Preferred: [phone] Home: [phone] (home) Mobile: [phone] (mobile) E-mail: [email or web address]  [id number] [id number]: [phone]: [phone]  [facility] [place] [address]: [phone]: [phone]  Pre-screening  [name] offered "in-person" vs "virtual" encounter. She indicated preferring virtual for this encounter.   Reason COVID-19*  Social distancing based on CDC and AMA recommendations.   I contacted Karen Eveneron 10/23/2020 via telephone.      I clearly identified myself as BGillis Santa MD. I verified that I was speaking with the correct person using two identifiers (Name: [name] and date of birth: 9May 25, 1967.  Consent I sought verbal advanced consent from Karen Evenerfor virtual visit interactions. I informed [name] possible security and privacy concerns, risks, and limitations associated with providing "not-in-person" medical evaluation  and management services. I also informed [name] the availability of "in-person" appointments. Finally, I informed her that there would be a charge for the virtual visit and that she could be  personally, fully or partially, financially responsible for it. [name] understanding and agreed to proceed.   Historic Elements   [name] a 55y.o. year old, female patient evaluated today after our last contact on 10/22/2020. Ms. MHanken has a past medical history of Anxiety and depression (09/13/2006), Arthritis, Asthma, COPD (chronic obstructive pulmonary disease) ([place], Depression, Diabetes mellitus, Diabetic neuropathy, painful ([place], Diabetic retinopathy ([place], Diastolic dysfunction, Dilated aortic root ([place], Dizziness, Epilepsy idiopathic petit mal ([place], GERD (gastroesophageal reflux disease), Glaucoma, Headache(784.0), Heart murmur, History of stress test, blood clots, Hyperlipidemia, Hypertension, Legally blind, MIGRAINE HEADACHE (09/14/2006), Myocardial infarction ([place], Nephrolithiasis, Pain (09/23/11), Pneumonia, Pyelonephritis, Seizures ([place], Sleep apnea, Stress incontinence, and Stroke ([place]. She also  has a past surgical history that includes kidney stone removal; Brain hematoma evacuation; Shunt removal; Retinal detachment surgery (Left, 1990); Vaginal hysterectomy (1996); Incisional hernia repair (05/05/2011); Mass excision (05/05/2011); PCNL; Cystoscopy/retrograde/ureteroscopy (08/24/2011); Cystoscopy with ureteroscopy (09/24/2011); Cystoscopy w/ retrogrades (09/24/2011); and RIGHT/LEFT HEART CATH AND CORONARY ANGIOGRAPHY (N/A, 04/28/2019). [name] a current medication list which includes the following prescription(s): amitriptyline, accu-chek softclix lancets, albuterol, albuterol, amlodipine, amlodipine, baclofen, budesonide, carvedilol, cholecalciferol, clonidine, clopidogrel, cyclosporine, dexamethasone, trulicity, duloxetine, epinephrine, ezetimibe, farxiga,  fluconazole, fluticasone, gabapentin, accu-chek guide, hydralazine, hydrochlorothiazide, omnipod dash pods (gen 4), insulin lispro, levetiracetam, losartan, mirabegron er, nitrofurantoin, nitroglycerin, pantoprazole, polyethylene glycol, potassium chloride sa, simvastatin, spironolactone, terconazole, terconazole, and wixela inhub. She  reports that she quit smoking about 33 years ago. Her smoking use included cigarettes. She has a 15.00 pack-year smoking history. She has never used smokeless tobacco. She reports  current alcohol use of about 1.0 standard drink per week. She reports that she does not use drugs. [name] is allergic to codeine and lactose intolerance (gi).   HPI  Today, she is being contacted for medication management.  Patient presents today virtually for increased pain of bilateral lower extremities related to painful diabetic neuropathy, centralized pain due to a history of multiple strokes.  She is currently on 600 mg of gabapentin in the morning, 600 mg of gabapentin in the afternoon and 2100 mg at night.  She is also on Cymbalta managed by her PCP.  We discussed transition to Lyrica versus addition of amitriptyline.  Risks and benefits of both medications were discussed.  Patient states that she has tried Lyrica in the past with limited response although she is open to trying it again.  She also endorses insomnia and trouble sleeping so I suggested that amitriptyline at a low dose may be good to trial first.  I counseled her on the risk of serotonin syndrome with her being on Cymbalta.  I [name] have her start at 25 mg and titrate up to 50 mg with instructions below.  She is instructed to follow-up in 6 weeks face-to-face.  Future considerations could include Qutenza capsaicin treatment for painful diabetic neuropathy.  Patient endorsed understanding.  Continue gabapentin as prescribed   Laboratory Chemistry Profile   Renal Lab Results  Component Value Date   BUN 17 09/30/2020    CREATININE 0.85 09/30/2020   LABCREA 48 42/59/5638   BCR NOT APPLICABLE 75/64/3329   GFR 76.80 09/30/2020   GFRAA >60 05/30/2019   GFRNONAA >60 09/07/2020    Hepatic Lab Results  Component Value Date   AST 14 (L) 08/26/2020   ALT 15 08/26/2020   ALBUMIN 3.9 08/26/2020   ALKPHOS 71 08/26/2020   LIPASE 46 06/13/2020    Electrolytes Lab Results  Component Value Date   NA 139 09/30/2020   K 4.3 09/30/2020   CL 104 09/30/2020   CALCIUM 9.1 09/30/2020   MG 1.9 06/14/2020   PHOS 3.0 06/14/2020    Bone Lab Results  Component Value Date   VD25OH 26 (L) 01/24/2018    Inflammation (CRP: Acute Phase) (ESR: Chronic Phase) Lab Results  Component Value Date   CRP 2.5 11/14/2018   ESRSEDRATE 29 11/14/2018   LATICACIDVEN 0.9 02/22/2014         Note: Above Lab results reviewed.  Imaging  US Abdomen Limited RUQ (LIVER/GB) CLINICAL DATA:  Abdominal pain.  EXAM: ULTRASOUND ABDOMEN LIMITED RIGHT UPPER QUADRANT  COMPARISON:  None.  FINDINGS: Gallbladder:  No gallstones or wall thickening visualized (2.5 mm). No sonographic Murphy sign noted by sonographer.  Common bile duct:  Diameter: 2.5 mm  Liver:  No focal lesion identified. Diffusely increased echogenicity of the liver parenchyma is noted portal vein is patent on color Doppler imaging with normal direction of blood flow towards the liver.  Other: None.  IMPRESSION: Fatty liver.  Electronically Signed   By: Virgina Norfolk M.D.   On: 09/20/2020 01:56  Assessment  The primary encounter diagnosis was Diabetic polyneuropathy associated with type 2 diabetes mellitus ([place]). Diagnoses of Central pain syndrome, Myalgia, and Chronic pain syndrome were also pertinent to this visit.  Plan of Care  Problem-specific:  No problem-specific Assessment & Plan notes found for this encounter.  [name] has a current medication list which includes the following long-term medication(s): amitriptyline,  albuterol, albuterol, amlodipine, amlodipine, budesonide, duloxetine, ezetimibe, fluticasone, gabapentin, hydralazine,  hydrochlorothiazide, insulin lispro, levetiracetam, losartan, mirabegron er, pantoprazole, potassium chloride sa, spironolactone, and wixela inhub.  Pharmacotherapy (Medications Ordered): Meds ordered this encounter  Medications   amitriptyline (ELAVIL) 25 MG tablet    Sig: Take 1 tablet (25 mg total) by mouth at bedtime for 20 days, THEN 2 tablets (50 mg total) at bedtime.    Dispense:  100 tablet    Refill:  0     Follow-up plan:   Return in about 6 weeks (around 12/04/2020) for Medication Management, in person.    Recent Visits Date Type Provider Dept  10/22/20 Office Visit Gillis Santa, MD [facility]  Showing recent visits within past 90 days and meeting all other requirements Today's Visits Date Type Provider Dept  10/23/20 Office Visit Gillis Santa, MD [facility]  Showing today's visits and meeting all other requirements Future Appointments No visits were found meeting these conditions. Showing future appointments within next 90 days and meeting all other requirements I discussed the assessment and treatment plan with the patient. The patient was provided an opportunity to ask questions and all were answered. The patient agreed with the plan and demonstrated an understanding of the instructions.  Patient advised to call back or seek an in-person evaluation if the symptoms or condition worsens.  Duration of encounter: 66mnutes.  Note by: BGillis Santa MD Date: 10/23/2020; Time: 11:13 AM

## 2020-10-29 ENCOUNTER — Ambulatory Visit (INDEPENDENT_AMBULATORY_CARE_PROVIDER_SITE_OTHER): Payer: Medicare Other

## 2020-10-29 DIAGNOSIS — I10 Essential (primary) hypertension: Secondary | ICD-10-CM

## 2020-10-29 NOTE — Patient Instructions (Addendum)
Thank you for allowing the Chronic Care Management team to participate in your care.       Problem Identified: Hypertension and Hyperlipidemia      Long-Range Goal: Hypertension and Hyperlipidemia Monitored   Start Date: 07/25/2020  Expected End Date: 11/22/2020  Priority: Medium  Note:   Objective:   BP Readings from Last 3 Encounters:  09/08/20 (!) 200/92  09/04/20 (!) 182/110  08/20/20 134/80   Lab Results  Component Value Date   CHOL 169 06/10/2020   HDL 41.10 06/10/2020   LDLCALC 93 06/10/2020   TRIG 172.0 (H) 06/10/2020   CHOLHDL 4 06/10/2020    Current Barriers:  Chronic Care Management support and educational needs related to Hypertension and Hyperlipidemia  Case Manager Clinical Goal(s):  Over the next 120 days, patient will demonstrate improved adherence to prescribed treatment plan as evidenced by taking all medications as prescribed, monitoring and recording blood pressure as directed and adhering to heart healthy/DASH diet.   Interventions:  Collaboration with Delsa Grana, [name] regarding development and update of comprehensive plan of care as evidenced by provider attestation and co-signature Inter-disciplinary care team collaboration (see longitudinal plan of care) Evaluation of current treatment plan related to hypertension and hyperlipidemia self management. Reviewed medications and compliance with treatment plan. Reports feeling well at time of call but notes recent BP readings have been significantly elevated. Reports adjusting medications and taking clonidine for readings above 160/90 as advised by the Cardiology team. Notes reading have remained elevated with systolic greater than 800. Diastolic readings have remained elevated in the high 90's and 100's. Thoroughly discussed symptoms. Declines episodes of chest pain, palpitations, dizziness or lightheadedness. Denies headaches or visual changes. Reports family and friends noted facial changes and voiced  concerns regarding stroke. She denies changes in speech or weakness. Spoke with patient's caregiver/daughter Gevena Mart. Thoroughly discussed s/sx of heart attack and stroke and indications for seeking immediate medical attention. Both agreed to monitor symptoms, recheck BP within the next hour and report to the nearest Urgent Care or Emergency Room if readings remain elevated. Agreed to call 911 if [name] experiences s/sx of heart attack or stroke.     Self-Care Activities/Patient Goals Self administers medications as prescribed Attend all scheduled provider appointments Monitor BP and record readings Continue adjusting medications as advised by the Cardiology team and seek medical attention for significantly elevated readings Call 911 if experiencing s/sx of heart attack or stroke     Follow Up Plan:  Will follow up within the next week      Mrs. Lindvall verbalized understanding of the information discussed during the telephonic outreach. Declined need for mailed/printed instructions. A member of the care management team will follow up within the next week.   [name] Care [facility] [phone]

## 2020-10-29 NOTE — Chronic Care Management (AMB) (Signed)
Chronic Care Management   CCM RN Visit Note  10/29/2020 Name: [name] MRN: [id number] DOB: 06-09-1965  Subjective: [name] is a 55 y.o. year old female who is a primary care patient of Delsa Grana, [place]. The care management team was consulted for assistance with disease management and care coordination needs.    Engaged with patient by telephone for follow up visit in response to provider referral for case management and care coordination services.   Consent to Services:  The patient was given information about Chronic Care Management services, agreed to services, and gave verbal consent prior to initiation of services.  Please see initial visit note for detailed documentation.    Assessment: Review of patient past medical history, allergies, medications, health status, including review of consultants reports, laboratory and other test data, was performed as part of comprehensive evaluation and provision of chronic care management services.   SDOH (Social Determinants of Health) assessments and interventions performed:  No  CCM Care Plan  Allergies  Allergen Reactions   Codeine Swelling and Other (See Comments)    Swelling and burning of mouth (inside)   Lactose Intolerance (Gi) Nausea And Vomiting    Outpatient Encounter Medications as of 10/29/2020  Medication Sig Note   Accu-Chek Softclix Lancets lancets 4 (four) times daily.    albuterol (ACCUNEB) 1.25 MG/3ML nebulizer solution Inhale 1 ampule into the lungs every 6 (six) hours as needed for wheezing or shortness of breath.    albuterol (PROAIR HFA) 108 (90 Base) MCG/ACT inhaler INHALE 2 PUFFS BY MOUTH EVERY 6 HOURS AS NEEDED FOR WHEEZING OR SHORTNESS OF BREATH (Patient taking differently: Inhale 2 puffs into the lungs every 6 (six) hours as needed for wheezing or shortness of breath.)    amitriptyline (ELAVIL) 25 MG tablet Take 1 tablet (25 mg total) by mouth at bedtime for 20 days, THEN 2 tablets  (50 mg total) at bedtime.    amLODipine (NORVASC) 10 MG tablet Take 1 tablet (10 mg total) by mouth daily.    amLODipine (NORVASC) 5 MG tablet Take 5 mg by mouth daily.    baclofen (LIORESAL) 10 MG tablet Take 1 tablet (10 mg total) by mouth 3 (three) times daily as needed for muscle spasms.    budesonide (PULMICORT) 0.5 MG/2ML nebulizer solution Inhale 0.5 mg into the lungs daily.    carvedilol (COREG) 12.5 MG tablet Take 12.5 mg by mouth 2 (two) times daily with a meal.    cholecalciferol (VITAMIN D3) 25 MCG (1000 UNIT) tablet Take 1,000 Units by mouth daily.    cloNIDine (CATAPRES) 0.1 MG tablet Take 0.1 mg by mouth daily.    clopidogrel (PLAVIX) 75 MG tablet Take 1 tablet (75 mg total) by mouth daily.    cycloSPORINE (RESTASIS) 0.05 % ophthalmic emulsion Place 2 drops into both eyes 2 (two) times daily. 07/25/2020: Reports speaking with local pharmacy. Pending receipt.   dexamethasone (DECADRON) 1 MG tablet Take at 11 pm the night prior to your blood draw.    Dulaglutide (TRULICITY) 4.5 XV/4.0GQ SOPN Inject 4.5 mg as directed once a week.    DULoxetine (CYMBALTA) 20 MG capsule TAKE 2 CAPSULES EVERY MORNING AND 1 CAPSULE EVERY NIGHT.    EPINEPHrine 0.3 mg/0.3 mL IJ SOAJ injection Inject 0.3 mg into the muscle as needed for anaphylaxis.    ezetimibe (ZETIA) 10 MG tablet Take 1 tablet (10 mg total) by mouth daily.    FARXIGA 10 MG TABS tablet TAKE 1 TABLET BY MOUTH EVERY  DAY    fluconazole (DIFLUCAN) 150 MG tablet Take 1 tablet (150 mg total) by mouth every 3 (three) days as needed (for refractory vaginal itching/yeast infection).    fluticasone (FLONASE) 50 MCG/ACT nasal spray Place 2 sprays into both nostrils daily.    gabapentin (NEURONTIN) 600 MG tablet 600 mg in the morning, 600 mg in the afternoon, 1200 mg nightly. (Patient taking differently: Take 600-1,200 mg by mouth See admin instructions. Take 1 tablet (655m) by mouth every morning and afternoon and then take 2 tablets (12061m by mouth  every night)    glucose blood (ACCU-CHEK GUIDE) test strip Use as instructed to check blood sugar 4 times per day Dx code E11.9    hydrALAZINE (APRESOLINE) 25 MG tablet Take 1 tablet (25 mg total) by mouth every 8 (eight) hours.    hydrochlorothiazide (HYDRODIURIL) 25 MG tablet Take 1 tablet (25 mg total) by mouth daily.    Insulin Disposable Pump (OMNIPOD DASH PODS, GEN 4,) MISC Change pod every 2 days    insulin lispro (HUMALOG) 100 UNIT/ML injection USE UP TO 150 UNITS/DAY IN INSULIN PUMP    levETIRAcetam (KEPPRA) 500 MG tablet Take 500-750 mg by mouth See admin instructions. Take 1 tablet (50032mby mouth every morning and take 1 tablets (750m21my mouth every night    losartan (COZAAR) 100 MG tablet Take 1 tablet (100 mg total) by mouth daily.    mirabegron ER (MYRBETRIQ) 50 MG TB24 tablet Take 1 tablet (50 mg total) by mouth daily.    nitrofurantoin (MACRODANTIN) 50 MG capsule Take 50 mg by mouth at bedtime.    nitroGLYCERIN (NITROSTAT) 0.4 MG SL tablet Place 0.4 mg under the tongue every 5 (five) minutes as needed for chest pain.    pantoprazole (PROTONIX) 40 MG tablet Take 1 tablet (40 mg total) by mouth daily.    polyethylene glycol (MIRALAX / GLYCOLAX) 17 g packet Take 17 g by mouth daily as needed for mild constipation.    potassium chloride SA (KLOR-CON M20) 20 MEQ tablet You have not needed potassium while in the hospital.  Hold until outpatient followup. (Patient taking differently: Take 20 mEq by mouth 2 (two) times daily.)    simvastatin (ZOCOR) 20 MG tablet Take 20 mg by mouth daily.    spironolactone (ALDACTONE) 25 MG tablet Take 25 mg by mouth daily.    terconazole (TERAZOL 7) 0.4 % vaginal cream Place 1 applicator vaginally at bedtime as needed (irritation).    WIXELA INHUB 250-50 MCG/DOSE AEPB Inhale 1 puff into the lungs 2 (two) times daily.    No facility-administered encounter medications on file as of 10/29/2020.    Patient Active Problem List   Diagnosis Date Noted    Weakness of right arm    SBO (small bowel obstruction) (HCC)    Acute cystitis with hematuria    Right sided weakness    Adrenal adenoma, right    Nausea & vomiting 06/13/2020   Ileus (HCC)[place]/26/2022   PAD (peripheral artery disease) (HCC)[place]/05/2020   Leg pain 01/24/2020   Leg pain, bilateral 01/11/2020   Numbness and tingling 01/11/2020   Abnormal ankle brachial index (ABI) 12/18/2019   Hypoglycemia 05/17/2019   Hypoglycemia associated with type 2 diabetes mellitus (HCC)[place]/27/2021   Chest pain 04/27/2019   Type 2 diabetes mellitus without complication, with long-term current use of insulin (HCC)[place]/08/2019   COPD (chronic obstructive pulmonary disease) (HCC)[place]/08/2019   CAD (coronary artery disease) 04/27/2019   H/O: stroke with  residual effects    Myalgia 04/19/2019   Central pain syndrome 04/19/2019   Non-intractable vomiting 03/01/2019   History of CVA (cerebrovascular accident) 07/08/2018   History of seizure 07/08/2018   Diabetic polyneuropathy associated with type 2 diabetes mellitus ([place]) 03/02/2018   Obesity (BMI 30-39.9) 03/02/2018   Vitamin D deficiency 01/24/2018   Vulvovaginal candidiasis 01/24/2018   Hypokalemia 06/08/2017   Overactive bladder 05/18/2017   Asthma 06/22/2016   Dilated aortic root (HCC)    Sacroiliac dysfunction 10/09/2014   Chronic low back pain 09/03/2014   CVA, old, hemiparesis ([place]) 09/03/2014   OSA (obstructive sleep apnea) 05/15/2014   Restrictive airway disease 05/15/2014   Colitis 02/22/2014   Type 2 diabetes mellitus with hyperlipidemia ([place]) 01/21/2014   Chronic pain syndrome 01/21/2014   Headache disorder 09/07/2013   Diabetic neuropathy ([place]) 05/23/2013   Mixed urge and stress incontinence 08/25/2011   Ureteral stricture, right 08/25/2011   Pyelonephritis 08/22/2011   Nephrolithiasis 08/22/2011   Incisional hernia 05/10/2011   Class 3 severe obesity with serious comorbidity and body mass index (BMI) of 40.0 to 44.9 in  adult ([place]) 05/10/2011   Tachycardia 05/08/2011   BLINDNESS, LEGAL, [place] DEFINITION 09/14/2006   Hyperlipemia 09/13/2006   Accelerated hypertension 09/13/2006   MYOCARDIAL INFARCTION, HX OF 09/13/2006   GERD 09/13/2006   Seizure disorder ([place]) 09/13/2006    Conditions to be addressed/monitored:HTN    Problem Identified: Hypertension and Hyperlipidemia      Long-Range Goal: Hypertension and Hyperlipidemia Monitored   Start Date: 07/25/2020  Expected End Date: 11/22/2020  Priority: Medium  Note:   Objective:   BP Readings from Last 3 Encounters:  09/08/20 (!) 200/92  09/04/20 (!) 182/110  08/20/20 134/80   Lab Results  Component Value Date   CHOL 169 06/10/2020   HDL 41.10 06/10/2020   LDLCALC 93 06/10/2020   TRIG 172.0 (H) 06/10/2020   CHOLHDL 4 06/10/2020    Current Barriers:  Chronic Care Management support and educational needs related to Hypertension and Hyperlipidemia  Case Manager Clinical Goal(s):  Over the next 120 days, patient will demonstrate improved adherence to prescribed treatment plan as evidenced by taking all medications as prescribed, monitoring and recording blood pressure as directed and adhering to heart healthy/DASH diet.   Interventions:  Collaboration with Delsa Grana, [name] regarding development and update of comprehensive plan of care as evidenced by provider attestation and co-signature Inter-disciplinary care team collaboration (see longitudinal plan of care) Evaluation of current treatment plan related to hypertension and hyperlipidemia self management. Reviewed medications and compliance with treatment plan. Reports feeling well at time of call but notes recent BP readings have been significantly elevated. Reports adjusting medications and taking clonidine for readings above 160/90 as advised by the Cardiology team. Notes reading have remained elevated with systolic greater than 650. Diastolic readings have remained elevated in the high 90's  and 100's. Thoroughly discussed symptoms. Declines episodes of chest pain, palpitations, dizziness or lightheadedness. Denies headaches or visual changes. Reports family and friends noted facial changes and voiced concerns regarding stroke. She denies changes in speech or weakness. Spoke with patient's caregiver/daughter Gevena Mart. Thoroughly discussed s/sx of heart attack and stroke and indications for seeking immediate medical attention. Both agreed to monitor symptoms, recheck BP within the next hour and report to the nearest Urgent Care or Emergency Room if readings remain elevated. Agreed to call 911 if Mrs. Barcomb experiences s/sx of heart attack or stroke.     Self-Care Activities/Patient Goals Self administers medications as  prescribed Attend all scheduled provider appointments Monitor BP and record readings Continue adjusting medications as advised by the Cardiology team and seek medical attention for significantly elevated readings Call 911 if experiencing s/sx of heart attack or stroke     Follow Up Plan:  Will follow up within the next week     PLAN A member of the care management team will follow up within the next week.   [name] [facility] Care Management [facility] [phone]

## 2020-11-01 ENCOUNTER — Ambulatory Visit: Payer: Self-pay

## 2020-11-01 DIAGNOSIS — Z789 Other specified health status: Secondary | ICD-10-CM

## 2020-11-01 DIAGNOSIS — I10 Essential (primary) hypertension: Secondary | ICD-10-CM

## 2020-11-01 NOTE — Chronic Care Management (AMB) (Signed)
Chronic Care Management   CCM RN Visit Note  11/01/2020 Name: [name] MRN: [id number] DOB: 11-08-1965  Subjective: [name] is a 55 y.o. year old female who is a primary care patient of Delsa Grana, [place]. The care management team was consulted for assistance with disease management and care coordination needs.    Engaged with patient by telephone for follow up visit in response to provider referral for case management and care coordination services.   Consent to Services:  The patient was given information about Chronic Care Management services, agreed to services, and gave verbal consent prior to initiation of services.  Please see initial visit note for detailed documentation.   Assessment: Review of patient past medical history, allergies, medications, health status, including review of consultants reports, laboratory and other test data, was performed as part of comprehensive evaluation and provision of chronic care management services.   SDOH (Social Determinants of Health) assessments and interventions performed:   SDOH Interventions    Flowsheet Row Most Recent Value  SDOH Interventions   Transportation Interventions [id number] Referral       CCM Care Plan  Allergies  Allergen Reactions   Codeine Swelling and Other (See Comments)    Swelling and burning of mouth (inside)   Lactose Intolerance (Gi) Nausea And Vomiting    Outpatient Encounter Medications as of 11/01/2020  Medication Sig Note   Accu-Chek Softclix Lancets lancets 4 (four) times daily.    albuterol (ACCUNEB) 1.25 MG/3ML nebulizer solution Inhale 1 ampule into the lungs every 6 (six) hours as needed for wheezing or shortness of breath.    albuterol (PROAIR HFA) 108 (90 Base) MCG/ACT inhaler INHALE 2 PUFFS BY MOUTH EVERY 6 HOURS AS NEEDED FOR WHEEZING OR SHORTNESS OF BREATH (Patient taking differently: Inhale 2 puffs into the lungs every 6 (six) hours as needed for wheezing or  shortness of breath.)    amitriptyline (ELAVIL) 25 MG tablet Take 1 tablet (25 mg total) by mouth at bedtime for 20 days, THEN 2 tablets (50 mg total) at bedtime.    amLODipine (NORVASC) 10 MG tablet Take 1 tablet (10 mg total) by mouth daily.    amLODipine (NORVASC) 5 MG tablet Take 5 mg by mouth daily.    baclofen (LIORESAL) 10 MG tablet Take 1 tablet (10 mg total) by mouth 3 (three) times daily as needed for muscle spasms.    budesonide (PULMICORT) 0.5 MG/2ML nebulizer solution Inhale 0.5 mg into the lungs daily.    carvedilol (COREG) 12.5 MG tablet Take 12.5 mg by mouth 2 (two) times daily with a meal.    cholecalciferol (VITAMIN D3) 25 MCG (1000 UNIT) tablet Take 1,000 Units by mouth daily.    cloNIDine (CATAPRES) 0.1 MG tablet Take 0.1 mg by mouth daily.    clopidogrel (PLAVIX) 75 MG tablet Take 1 tablet (75 mg total) by mouth daily.    cycloSPORINE (RESTASIS) 0.05 % ophthalmic emulsion Place 2 drops into both eyes 2 (two) times daily. 07/25/2020: Reports speaking with local pharmacy. Pending receipt.   dexamethasone (DECADRON) 1 MG tablet Take at 11 pm the night prior to your blood draw.    Dulaglutide (TRULICITY) 4.5 ME/2.6ST SOPN Inject 4.5 mg as directed once a week.    DULoxetine (CYMBALTA) 20 MG capsule TAKE 2 CAPSULES EVERY MORNING AND 1 CAPSULE EVERY NIGHT.    EPINEPHrine 0.3 mg/0.3 mL IJ SOAJ injection Inject 0.3 mg into the muscle as needed for anaphylaxis.    ezetimibe (ZETIA) 10 MG tablet  Take 1 tablet (10 mg total) by mouth daily.    FARXIGA 10 MG TABS tablet TAKE 1 TABLET BY MOUTH EVERY DAY    fluconazole (DIFLUCAN) 150 MG tablet Take 1 tablet (150 mg total) by mouth every 3 (three) days as needed (for refractory vaginal itching/yeast infection).    fluticasone (FLONASE) 50 MCG/ACT nasal spray Place 2 sprays into both nostrils daily.    gabapentin (NEURONTIN) 600 MG tablet 600 mg in the morning, 600 mg in the afternoon, 1200 mg nightly. (Patient taking differently: Take 600-1,200  mg by mouth See admin instructions. Take 1 tablet (645m) by mouth every morning and afternoon and then take 2 tablets (12026m by mouth every night)    glucose blood (ACCU-CHEK GUIDE) test strip Use as instructed to check blood sugar 4 times per day Dx code E11.9    hydrALAZINE (APRESOLINE) 25 MG tablet Take 1 tablet (25 mg total) by mouth every 8 (eight) hours.    hydrochlorothiazide (HYDRODIURIL) 25 MG tablet Take 1 tablet (25 mg total) by mouth daily.    Insulin Disposable Pump (OMNIPOD DASH PODS, GEN 4,) MISC Change pod every 2 days    insulin lispro (HUMALOG) 100 UNIT/ML injection USE UP TO 150 UNITS/DAY IN INSULIN PUMP    levETIRAcetam (KEPPRA) 500 MG tablet Take 500-750 mg by mouth See admin instructions. Take 1 tablet (50065mby mouth every morning and take 1 tablets (750m36my mouth every night    losartan (COZAAR) 100 MG tablet Take 1 tablet (100 mg total) by mouth daily.    mirabegron ER (MYRBETRIQ) 50 MG TB24 tablet Take 1 tablet (50 mg total) by mouth daily.    nitrofurantoin (MACRODANTIN) 50 MG capsule Take 50 mg by mouth at bedtime.    nitroGLYCERIN (NITROSTAT) 0.4 MG SL tablet Place 0.4 mg under the tongue every 5 (five) minutes as needed for chest pain.    pantoprazole (PROTONIX) 40 MG tablet Take 1 tablet (40 mg total) by mouth daily.    polyethylene glycol (MIRALAX / GLYCOLAX) 17 g packet Take 17 g by mouth daily as needed for mild constipation.    potassium chloride SA (KLOR-CON M20) 20 MEQ tablet You have not needed potassium while in the hospital.  Hold until outpatient followup. (Patient taking differently: Take 20 mEq by mouth 2 (two) times daily.)    simvastatin (ZOCOR) 20 MG tablet Take 20 mg by mouth daily.    spironolactone (ALDACTONE) 25 MG tablet Take 25 mg by mouth daily.    terconazole (TERAZOL 7) 0.4 % vaginal cream Place 1 applicator vaginally at bedtime as needed (irritation).    WIXELA INHUB 250-50 MCG/DOSE AEPB Inhale 1 puff into the lungs 2 (two) times daily.     No facility-administered encounter medications on file as of 11/01/2020.    Patient Active Problem List   Diagnosis Date Noted   Weakness of right arm    SBO (small bowel obstruction) (HCC)[place] Acute cystitis with hematuria    Right sided weakness    Adrenal adenoma, right    Nausea & vomiting 06/13/2020   Ileus (HCC)[place]/26/2022   PAD (peripheral artery disease) (HCC)[place]/05/2020   Leg pain 01/24/2020   Leg pain, bilateral 01/11/2020   Numbness and tingling 01/11/2020   Abnormal ankle brachial index (ABI) 12/18/2019   Hypoglycemia 05/17/2019   Hypoglycemia associated with type 2 diabetes mellitus (HCC)[place]/27/2021   Chest pain 04/27/2019   Type 2 diabetes mellitus without complication, with long-term current use of insulin (  [place]) 04/27/2019   COPD (chronic obstructive pulmonary disease) ([place]) 04/27/2019   CAD (coronary artery disease) 04/27/2019   H/O: stroke with residual effects    Myalgia 04/19/2019   Central pain syndrome 04/19/2019   Non-intractable vomiting 03/01/2019   History of CVA (cerebrovascular accident) 07/08/2018   History of seizure 07/08/2018   Diabetic polyneuropathy associated with type 2 diabetes mellitus ([place]) 03/02/2018   Obesity (BMI 30-39.9) 03/02/2018   Vitamin D deficiency 01/24/2018   Vulvovaginal candidiasis 01/24/2018   Hypokalemia 06/08/2017   Overactive bladder 05/18/2017   Asthma 06/22/2016   Dilated aortic root (HCC)    Sacroiliac dysfunction 10/09/2014   Chronic low back pain 09/03/2014   CVA, old, hemiparesis ([place]) 09/03/2014   OSA (obstructive sleep apnea) 05/15/2014   Restrictive airway disease 05/15/2014   Colitis 02/22/2014   Type 2 diabetes mellitus with hyperlipidemia ([place]) 01/21/2014   Chronic pain syndrome 01/21/2014   Headache disorder 09/07/2013   Diabetic neuropathy ([place]) 05/23/2013   Mixed urge and stress incontinence 08/25/2011   Ureteral stricture, right 08/25/2011   Pyelonephritis 08/22/2011   Nephrolithiasis  08/22/2011   Incisional hernia 05/10/2011   Class 3 severe obesity with serious comorbidity and body mass index (BMI) of 40.0 to 44.9 in adult ([place]) 05/10/2011   Tachycardia 05/08/2011   BLINDNESS, LEGAL, [place] DEFINITION 09/14/2006   Hyperlipemia 09/13/2006   Accelerated hypertension 09/13/2006   MYOCARDIAL INFARCTION, HX OF 09/13/2006   GERD 09/13/2006   Seizure disorder ([place]) 09/13/2006    Conditions to be addressed/monitored:HTN Patient Care Plan: Hypertension and Hyperlipidemia     Problem Identified: Hypertension and Hyperlipidemia      Long-Range Goal: Hypertension and Hyperlipidemia Monitored   Start Date: 07/25/2020  Expected End Date: 11/22/2020  Priority: Medium  Note:   Objective:  BP Readings from Last 3 Encounters:  10/02/20 (!) 190/100  09/08/20 (!) 200/92  09/04/20 (!) 182/110    Current Barriers:  Chronic Care Management support and educational needs related to Hypertension and Hyperlipidemia  Case Manager Clinical Goal(s):  Over the next 120 days, patient will demonstrate improved adherence to prescribed treatment plan as evidenced by taking all medications as prescribed, monitoring and recording blood pressure as directed and adhering to heart healthy/DASH diet.   Interventions:  Collaboration with Delsa Grana, [name] regarding development and update of comprehensive plan of care as evidenced by provider attestation and co-signature Inter-disciplinary care team collaboration (see longitudinal plan of care) Evaluation of current treatment plan related to hypertension and hyperlipidemia self management. Reviewed medications and compliance with treatment plan. Reports feeling well at time of call but notes recent BP readings have been significantly elevated. Reports adjusting medications and taking clonidine for readings above 160/90 as advised by the Cardiology team. Notes reading have remained elevated with systolic greater than 106. Diastolic readings have  remained elevated in the high 90's and 100's. Thoroughly discussed symptoms. Declines episodes of chest pain, palpitations, dizziness or lightheadedness. Denies headaches or visual changes. Reports family and friends noted facial changes and voiced concerns regarding stroke. She denies changes in speech or weakness. Spoke with patient's caregiver/daughter Gevena Mart. Thoroughly discussed s/sx of heart attack and stroke and indications for seeking immediate medical attention. Both agreed to monitor symptoms, recheck BP within the next hour and report to the nearest Urgent Care or Emergency Room if readings remain elevated. Agreed to call 911 if Mrs. Polidore experiences s/sx of heart attack or stroke. Update on 11/01/20: Mrs. Hook was evaluated in the Emergency Department for elevated BP on  10/29/20. Reports completing post ER follow up with the Cardiology team. Reports taking all medications  as prescribed. No medication changes since ER visit however she was advised by the Cardiology team of need to complete follow up with Pulmonology to obtain her CPAP. Reviewed current symptoms. She denies chest pain, palpitations, headaches or dizziness. Denies visual changes.  Thoroughly reviewed importance of monitoring BP closely and indications for seeking immediate medical attention.    Self-Care Activities/Patient Goals Self administers medications as prescribed Attend all scheduled provider appointments Monitor BP and record readings Continue adjusting medications as advised by the Cardiology team and seek medical attention for significantly elevated readings Follow up with the Pulmonology team as advised to obtain CPAP Call 911 if experiencing s/sx of heart attack or stroke     Follow Up Plan:  Will follow up within the next month       PLAN Referral placed for assistance with transportation A member of the care management team will follow up within the next month.   [name]  Health/[facility] Care Management [facility] [phone]

## 2020-11-01 NOTE — Patient Instructions (Addendum)
Thank you for allowing the Chronic Care Management team to participate in your care.    Patient Care Plan: Hypertension and Hyperlipidemia     Problem Identified: Hypertension and Hyperlipidemia      Long-Range Goal: Hypertension and Hyperlipidemia Monitored   Start Date: 07/25/2020  Expected End Date: 11/22/2020  Priority: Medium  Note:   Objective:  BP Readings from Last 3 Encounters:  10/02/20 (!) 190/100  09/08/20 (!) 200/92  09/04/20 (!) 182/110    Current Barriers:  Chronic Care Management support and educational needs related to Hypertension and Hyperlipidemia  Case Manager Clinical Goal(s):  Over the next 120 days, patient will demonstrate improved adherence to prescribed treatment plan as evidenced by taking all medications as prescribed, monitoring and recording blood pressure as directed and adhering to heart healthy/DASH diet.   Interventions:  Collaboration with Delsa Grana, [name] regarding development and update of comprehensive plan of care as evidenced by provider attestation and co-signature Inter-disciplinary care team collaboration (see longitudinal plan of care) Evaluation of current treatment plan related to hypertension and hyperlipidemia self management. Reviewed medications and compliance with treatment plan. Reports feeling well at time of call but notes recent BP readings have been significantly elevated. Reports adjusting medications and taking clonidine for readings above 160/90 as advised by the Cardiology team. Notes reading have remained elevated with systolic greater than 756. Diastolic readings have remained elevated in the high 90's and 100's. Thoroughly discussed symptoms. Declines episodes of chest pain, palpitations, dizziness or lightheadedness. Denies headaches or visual changes. Reports family and friends noted facial changes and voiced concerns regarding stroke. She denies changes in speech or weakness. Spoke with patient's caregiver/daughter  [name]. Thoroughly discussed s/sx of heart attack and stroke and indications for seeking immediate medical attention. Both agreed to monitor symptoms, recheck BP within the next hour and report to the nearest Urgent Care or Emergency Room if readings remain elevated. Agreed to call 911 if [name] experiences s/sx of heart attack or stroke. Update on 11/01/20: [name] was evaluated in the Emergency Department for elevated BP on 10/29/20. Reports completing post ER follow up with the Cardiology team. Reports taking all medications  as prescribed. No medication changes since ER visit however she was advised by the Cardiology team of need to complete follow up with Pulmonology to obtain her CPAP. Reviewed current symptoms. She denies chest pain, palpitations, headaches or dizziness. Denies visual changes.  Thoroughly reviewed importance of monitoring BP closely and indications for seeking immediate medical attention.    Self-Care Activities/Patient Goals Self administers medications as prescribed Attend all scheduled provider appointments Monitor BP and record readings Continue adjusting medications as advised by the Cardiology team and seek medical attention for significantly elevated readings Follow up with the Pulmonology team as advised to obtain CPAP Call 911 if experiencing s/sx of heart attack or stroke     Follow Up Plan:  Will follow up within the next month        [name] verbalized understanding of the information discussed during the telephonic outreach. Declined need for mailed/printed instructions. A member of the care management team will follow up within the next month.   [name] [phone]

## 2020-11-04 ENCOUNTER — Telehealth: Payer: Self-pay | Admitting: *Deleted

## 2020-11-04 NOTE — Telephone Encounter (Signed)
   Telephone encounter was:  Successful.  11/04/2020 Name: [name] MRN: [id number] DOB: September 20, 1965  [name] is a 55 y.o. year old female who is a primary care patient of Delsa Grana, [place] . The community resource team was consulted for assistance with Transportation Needs   Care guide performed the following interventions: Patient provided with information about care guide support team and interviewed to confirm resource needs Follow up call placed to community resources to determine status of patients referral.  Follow Up Plan:  No further follow up planned at this time. The patient has been provided with needed resources. [place], Care Management  [phone] [address] , [place] [address] Email : [email or web address]

## 2020-11-05 ENCOUNTER — Telehealth: Payer: Self-pay | Admitting: Family Medicine

## 2020-11-05 NOTE — Telephone Encounter (Signed)
   [name] DOB: [birth year] MRN: [id number]   RIDER WAIVER AND RELEASE OF LIABILITY  For purposes of improving physical access to our facilities, [facility] is pleased to partner with third parties to provide [name] patients or other authorized individuals the option of convenient, on-demand ground transportation services (the [place]") through use of the technology service that enables users to request on-demand ground transportation from independent third-party providers.  By opting to use and accept these [employer], I, the undersigned, hereby agree on behalf of myself, and on behalf of any minor child using the Government social research officer for whom I am the parent or legal guardian, as follows:  Government social research officer provided to me are provided by independent third-party transportation providers who are not [employer] or employees and who are unaffiliated with [employer]. [facility] is neither a transportation carrier nor a common or public carrier. [facility] has no control over the quality or safety of the transportation that occurs as a result of the [employer]. [facility] cannot guarantee that any third-party transportation provider will complete any arranged transportation service. [facility] makes no representation, warranty, or guarantee regarding the reliability, timeliness, quality, safety, suitability, or availability of any of the Transport Services or that they will be error free. I fully understand that traveling by vehicle involves risks and dangers of serious bodily injury, including permanent disability, paralysis, and death. I agree, on behalf of myself and on behalf of any minor child using the Transport Services for whom I am the parent or legal guardian, that the entire risk arising out of my use of the [employer] remains solely with me, to the maximum extent permitted under applicable law. The [employer] are provided  "as is" and "as available." [facility] disclaims all representations and warranties, express, implied or statutory, not expressly set out in these terms, including the implied warranties of merchantability and fitness for a particular purpose. I hereby waive and release [facility], its agents, employees, officers, directors, representatives, insurers, attorneys, assigns, successors, subsidiaries, and affiliates from any and all past, present, or future claims, demands, liabilities, actions, causes of action, or suits of any kind directly or indirectly arising from acceptance and use of the [employer]. I further waive and release [facility] and its affiliates from all present and future liability and responsibility for any injury or death to persons or damages to property caused by or related to the use of the [employer]. I have read this Waiver and Release of Liability, and I understand the terms used in it and their legal significance. This Waiver is freely and voluntarily given with the understanding that my right (as well as the right of any minor child for whom I am the parent or legal guardian using the [employer]) to legal recourse against [place] in connection with the [employer] is knowingly surrendered in return for use of these services.   I attest that I read the consent document to [name], gave Ms. Delgadillo the opportunity to ask questions and answered the questions asked (if any). I affirm that [name] then provided consent for she's participation in this program.     Legrand Pitts

## 2020-11-07 ENCOUNTER — Telehealth: Payer: Self-pay | Admitting: Endocrinology

## 2020-11-07 NOTE — Telephone Encounter (Signed)
Chart notes sent to [facility].

## 2020-11-07 NOTE — Telephone Encounter (Signed)
[facility] calling in to request recent chart notes for [facility]. Fax back to [phone]

## 2020-11-08 ENCOUNTER — Ambulatory Visit: Payer: Medicare Other | Admitting: Endocrinology

## 2020-11-12 ENCOUNTER — Ambulatory Visit: Payer: Medicare Other | Admitting: Family Medicine

## 2020-11-13 ENCOUNTER — Ambulatory Visit (INDEPENDENT_AMBULATORY_CARE_PROVIDER_SITE_OTHER): Payer: Medicare Other | Admitting: Family Medicine

## 2020-11-13 ENCOUNTER — Encounter: Payer: Self-pay | Admitting: Family Medicine

## 2020-11-13 ENCOUNTER — Other Ambulatory Visit: Payer: Self-pay

## 2020-11-13 VITALS — BP 138/88 | HR 66 | Temp 98.3°F | Resp 16 | Ht 67.0 in | Wt 261.0 lb

## 2020-11-13 DIAGNOSIS — E1142 Type 2 diabetes mellitus with diabetic polyneuropathy: Secondary | ICD-10-CM

## 2020-11-13 DIAGNOSIS — I209 Angina pectoris, unspecified: Secondary | ICD-10-CM

## 2020-11-13 DIAGNOSIS — N390 Urinary tract infection, site not specified: Secondary | ICD-10-CM | POA: Insufficient documentation

## 2020-11-13 DIAGNOSIS — G894 Chronic pain syndrome: Secondary | ICD-10-CM

## 2020-11-13 DIAGNOSIS — Z23 Encounter for immunization: Secondary | ICD-10-CM | POA: Diagnosis not present

## 2020-11-13 DIAGNOSIS — E1169 Type 2 diabetes mellitus with other specified complication: Secondary | ICD-10-CM

## 2020-11-13 DIAGNOSIS — M62838 Other muscle spasm: Secondary | ICD-10-CM

## 2020-11-13 DIAGNOSIS — F330 Major depressive disorder, recurrent, mild: Secondary | ICD-10-CM

## 2020-11-13 DIAGNOSIS — I77810 Thoracic aortic ectasia: Secondary | ICD-10-CM

## 2020-11-13 DIAGNOSIS — E785 Hyperlipidemia, unspecified: Secondary | ICD-10-CM

## 2020-11-13 DIAGNOSIS — F331 Major depressive disorder, recurrent, moderate: Secondary | ICD-10-CM | POA: Insufficient documentation

## 2020-11-13 DIAGNOSIS — K219 Gastro-esophageal reflux disease without esophagitis: Secondary | ICD-10-CM

## 2020-11-13 MED ORDER — EZETIMIBE 10 MG PO TABS: 10.0000 mg | ORAL_TABLET | Freq: Every day | ORAL | 0 refills | Status: DC

## 2020-11-13 MED ORDER — SHINGRIX 50 MCG/0.5ML IM SUSR: 0.5000 mL | Freq: Once | INTRAMUSCULAR | 1 refills | Status: AC

## 2020-11-13 MED ORDER — ROSUVASTATIN CALCIUM 10 MG PO TABS: 10.0000 mg | ORAL_TABLET | Freq: Every day | ORAL | 0 refills | Status: DC

## 2020-11-13 MED ORDER — PANTOPRAZOLE SODIUM 20 MG PO TBEC: 20.0000 mg | DELAYED_RELEASE_TABLET | Freq: Every day | ORAL | 1 refills | Status: DC

## 2020-11-13 MED ORDER — DULOXETINE HCL 60 MG PO CPEP: 60.0000 mg | ORAL_CAPSULE | Freq: Every day | ORAL | 1 refills | Status: DC

## 2020-11-13 MED ORDER — BACLOFEN 10 MG PO TABS: 10.0000 mg | ORAL_TABLET | Freq: Three times a day (TID) | ORAL | 0 refills | Status: DC | PRN

## 2020-11-13 NOTE — Patient Instructions (Signed)
Replace Simvastatin with Rosuvastatin

## 2020-11-13 NOTE — Progress Notes (Signed)
Name: [name]   MRN: [id number]    DOB: 09-Aug-1965   Date:11/13/2020       Progress Note  Subjective  Chief Complaint  Follow Up  HPI  Aorta dilation: she sees Dr. Clayborn Bigness and he prescribes her bp medications, she had an Echo done in 2017 and states going back for a repeat echo soon.   Echo from 2017 : Normal LVF with mild LVH and increased stiffness of heart muscle.  There is mild leakiness of AV and mildly dilated aorta - repeat limited echo in 1 year for dilated aortic root  DMII: under the care of endo, denies polyphagia, polydipsia or polyuria. She has associated neuropathy, obesity, dyslipidemia and HTN. Taking ARB and simvastatin, LDL not at goal, willing to try Crestor   MDD: she has a long history of depression, she is now taking duloxetine 20 mg one in am and 2 at night, asked if she would like to take 60 mg once a day to simplify the regiment and she agreed . She denies suicidal thoughts or ideation. Phq 9 was zero but she told me she has down days and feels tired often  Angina: under the care of Dr. Clayborn Bigness, takes NTG twice a week for chest pain, described as tightness and can last 15-20 minutes not associated with nausea or vomiting, but feels dizzy and has diaphoresis. She is taking simvastatin but willing to try a stronger statin, we will give her Crestor, still on Plavix and Zetia. Dr. Clayborn Bigness is her cardiologist   GERD: she has been out of PPI for months, she has intermittent episodes of indigestion, we will try resuming PPI at lower dose and monitor. Weight is nog going down   Muscle Spasms: she goes to pain clinic and Dr. Holley Raring prescribes her gabapentin but we rx the baclofen for muscle spasms, she denies side effects of medications   Morbid obesity: BMI over 40, she in high dose Trulicity, discussed importance of weight loss   Patient Active Problem List   Diagnosis Date Noted   Recurrent UTI 11/13/2020   Angina pectoris ([place]) 11/13/2020   Major  depressive disorder, recurrent episode, moderate ([place]) 11/13/2020   Weakness of right arm    Adrenal adenoma, right    PAD (peripheral artery disease) ([place]) 01/24/2020   Abnormal ankle brachial index (ABI) 12/18/2019   CAD (coronary artery disease) 04/27/2019   Myalgia 04/19/2019   Diabetic polyneuropathy associated with type 2 diabetes mellitus ([place]) 03/02/2018   Vitamin D deficiency 01/24/2018   Overactive bladder 05/18/2017   Dilated aortic root (HCC)    Sacroiliac dysfunction 10/09/2014   Chronic low back pain 09/03/2014   CVA, old, hemiparesis ([place]) 09/03/2014   OSA (obstructive sleep apnea) 05/15/2014   Type 2 diabetes mellitus with hyperlipidemia ([place]) 01/21/2014   Chronic pain syndrome 01/21/2014   Headache disorder 09/07/2013   Mixed urge and stress incontinence 08/25/2011   Ureteral stricture, right 08/25/2011   Pyelonephritis 08/22/2011   Nephrolithiasis 08/22/2011   Incisional hernia 05/10/2011   Class 3 severe obesity with serious comorbidity and body mass index (BMI) of 40.0 to 44.9 in adult ([place]) 05/10/2011   BLINDNESS, LEGAL, [place] DEFINITION 09/14/2006   Hyperlipemia 09/13/2006   Accelerated hypertension 09/13/2006   GERD 09/13/2006   Seizure disorder ([place]) 09/13/2006    Past Surgical History:  Procedure Laterality Date   BRAIN HEMATOMA EVACUATION     five procedures total, first procedure when 80 months old, last at 55 years of age.  CYSTOSCOPY W/ RETROGRADES  09/24/2011   Procedure: CYSTOSCOPY WITH RETROGRADE PYELOGRAM;  Surgeon: Malka So, MD;  Location: [facility];  Service: Urology;  Laterality: Right;   CYSTOSCOPY WITH URETEROSCOPY  09/24/2011   Procedure: CYSTOSCOPY WITH URETEROSCOPY;  Surgeon: Malka So, MD;  Location: [facility];  Service: Urology;  Laterality: Right;  Balloon dilation right ureter    CYSTOSCOPY/RETROGRADE/URETEROSCOPY  08/24/2011   Procedure: CYSTOSCOPY/RETROGRADE/URETEROSCOPY;  Surgeon: Molli Hazard, MD;  Location: [facility];  Service:  Urology;  Laterality: Right;  Cysto, Right retrograde Pyelogram, right stent placement.    INCISIONAL HERNIA REPAIR  05/05/2011   Procedure: LAPAROSCOPIC INCISIONAL HERNIA;  Surgeon: Rolm Bookbinder, MD;  Location: [facility];  Service: General;  Laterality: N/A;   kidney stone removal     MASS EXCISION  05/05/2011   Procedure: EXCISION MASS;  Surgeon: Rolm Bookbinder, MD;  Location: [facility];  Service: General;  Laterality: Right;   [facility]     RETINAL DETACHMENT SURGERY Left 1990   RIGHT/LEFT HEART CATH AND CORONARY ANGIOGRAPHY N/A 04/28/2019   Procedure: RIGHT/LEFT HEART CATH AND CORONARY ANGIOGRAPHY;  Surgeon: Yolonda Kida, MD;  Location: [facility];  Service: Cardiovascular;  Laterality: N/A;   SHUNT REMOVAL     shunt inserted at age 60 removed at age 73    [place]    Family History  Problem Relation Age of Onset   Uterine cancer Mother    Hypertension Mother    Cancer Mother    Brain cancer Maternal Grandmother    Hypertension Maternal Grandmother    Birth defects Daughter    Hypertension Daughter    Cirrhosis Maternal Grandfather    ADD / ADHD Maternal Grandfather    Birth defects Maternal Grandfather    Diabetes Maternal Grandfather    Anesthesia problems Neg Hx    Colon cancer Neg Hx    Esophageal cancer Neg Hx    Pancreatic cancer Neg Hx     Social History   Tobacco Use   Smoking status: Former    Packs/day: 1.00    Years: 15.00    Pack years: 15.00    Types: Cigarettes    Quit date: 01/20/1987    Years since quitting: 33.8   Smokeless tobacco: Never   Tobacco comments:    quit more than 20 years  Substance Use Topics   Alcohol use: Yes    Alcohol/week: 1.0 standard drink    Types: 1 Glasses of wine per week     Current Outpatient Medications:    Accu-Chek Softclix Lancets lancets, 4 (four) times daily., Disp: , Rfl:    albuterol (ACCUNEB) 1.25 MG/3ML nebulizer solution, Inhale 1 ampule into the lungs every 6 (six) hours as needed  for wheezing or shortness of breath., Disp: , Rfl:    albuterol (PROAIR HFA) 108 (90 Base) MCG/ACT inhaler, INHALE 2 PUFFS BY MOUTH EVERY 6 HOURS AS NEEDED FOR WHEEZING OR SHORTNESS OF BREATH, Disp: 3 Inhaler, Rfl: 3   amitriptyline (ELAVIL) 25 MG tablet, Take 1 tablet (25 mg total) by mouth at bedtime for 20 days, THEN 2 tablets (50 mg total) at bedtime., Disp: 100 tablet, Rfl: 0   budesonide (PULMICORT) 0.5 MG/2ML nebulizer solution, Inhale 0.5 mg into the lungs daily., Disp: , Rfl:    carvedilol (COREG) 12.5 MG tablet, Take 12.5 mg by mouth 2 (two) times daily with a meal., Disp: , Rfl:    cholecalciferol (VITAMIN D3) 25 MCG (1000 UNIT) tablet, Take 1,000  Units by mouth daily., Disp: , Rfl:    cloNIDine (CATAPRES) 0.1 MG tablet, Take 0.1 mg by mouth daily., Disp: , Rfl:    clopidogrel (PLAVIX) 75 MG tablet, Take 1 tablet (75 mg total) by mouth daily., Disp: 90 tablet, Rfl: 0   cycloSPORINE (RESTASIS) 0.05 % ophthalmic emulsion, Place 2 drops into both eyes 2 (two) times daily., Disp: , Rfl:    Dulaglutide (TRULICITY) 4.5 FE/0.7HQ SOPN, Inject 4.5 mg as directed once a week., Disp: 2 mL, Rfl: 3   DULoxetine (CYMBALTA) 60 MG capsule, Take 1 capsule (60 mg total) by mouth daily., Disp: 90 capsule, Rfl: 1   EPINEPHrine 0.3 mg/0.3 mL IJ SOAJ injection, Inject 0.3 mg into the muscle as needed for anaphylaxis., Disp: , Rfl:    FARXIGA 10 MG TABS tablet, TAKE 1 TABLET BY MOUTH EVERY DAY, Disp: 90 tablet, Rfl: 1   fluconazole (DIFLUCAN) 150 MG tablet, Take 1 tablet (150 mg total) by mouth every 3 (three) days as needed (for refractory vaginal itching/yeast infection)., Disp: 2 tablet, Rfl: 1   fluticasone (FLONASE) 50 MCG/ACT nasal spray, Place 2 sprays into both nostrils daily., Disp: 16 g, Rfl: 3   gabapentin (NEURONTIN) 600 MG tablet, 600 mg in the morning, 600 mg in the afternoon, 1200 mg nightly. (Patient taking differently: Take 600-1,200 mg by mouth See admin instructions. Take 1 tablet (652m) by  mouth every morning and afternoon and then take 2 tablets (12059m by mouth every night), Disp: 120 tablet, Rfl: 2   glucose blood (ACCU-CHEK GUIDE) test strip, Use as instructed to check blood sugar 4 times per day Dx code E11.9, Disp: 400 strip, Rfl: 3   Insulin Disposable Pump (OMNIPOD DASH PODS, GEN 4,) MISC, Change pod every 2 days, Disp: 15 each, Rfl: 3   insulin lispro (HUMALOG) 100 UNIT/ML injection, USE UP TO 150 UNITS/DAY IN INSULIN PUMP, Disp: 90 mL, Rfl: 3   levETIRAcetam (KEPPRA) 500 MG tablet, Take 500-750 mg by mouth See admin instructions. Take 1 tablet (50098mby mouth every morning and take 1 tablets (750m48my mouth every night, Disp: , Rfl:    losartan (COZAAR) 100 MG tablet, Take 1 tablet (100 mg total) by mouth daily., Disp: 90 tablet, Rfl: 3   mirabegron ER (MYRBETRIQ) 50 MG TB24 tablet, Take 1 tablet (50 mg total) by mouth daily., Disp: 90 tablet, Rfl: 1   nitrofurantoin (MACRODANTIN) 50 MG capsule, Take 50 mg by mouth at bedtime., Disp: , Rfl:    nitroGLYCERIN (NITROSTAT) 0.4 MG SL tablet, Place 0.4 mg under the tongue every 5 (five) minutes as needed for chest pain., Disp: , Rfl:    polyethylene glycol (MIRALAX / GLYCOLAX) 17 g packet, Take 17 g by mouth daily as needed for mild constipation., Disp: 100 each, Rfl: 3   potassium chloride SA (KLOR-CON M20) 20 MEQ tablet, You have not needed potassium while in the hospital.  Hold until outpatient followup. (Patient taking differently: Take 20 mEq by mouth 2 (two) times daily.), Disp: 60 tablet, Rfl: 5   rosuvastatin (CRESTOR) 10 MG tablet, Take 1 tablet (10 mg total) by mouth daily., Disp: 90 tablet, Rfl: 0   spironolactone (ALDACTONE) 25 MG tablet, Take 25 mg by mouth daily., Disp: , Rfl:    WIXELA INHUB 250-50 MCG/DOSE AEPB, Inhale 1 puff into the lungs 2 (two) times daily., Disp: , Rfl:    Zoster Vaccine Adjuvanted (SHINGRIX) injection, Inject 0.5 mLs into the muscle once for 1 dose., Disp: 0.5 mL, Rfl:  1   amLODipine  (NORVASC) 10 MG tablet, Take 1 tablet (10 mg total) by mouth daily., Disp: 90 tablet, Rfl: 2   baclofen (LIORESAL) 10 MG tablet, Take 1 tablet (10 mg total) by mouth 3 (three) times daily as needed for muscle spasms., Disp: 270 tablet, Rfl: 0   ezetimibe (ZETIA) 10 MG tablet, Take 1 tablet (10 mg total) by mouth daily., Disp: 90 tablet, Rfl: 0   hydrALAZINE (APRESOLINE) 25 MG tablet, Take 1 tablet (25 mg total) by mouth every 8 (eight) hours., Disp: 90 tablet, Rfl: 2   hydrochlorothiazide (HYDRODIURIL) 25 MG tablet, Take 1 tablet (25 mg total) by mouth daily., Disp: 90 tablet, Rfl: 2   pantoprazole (PROTONIX) 20 MG tablet, Take 1 tablet (20 mg total) by mouth daily., Disp: 90 tablet, Rfl: 1  Allergies  Allergen Reactions   Codeine Swelling and Other (See Comments)    Swelling and burning of mouth (inside)   Lactose Intolerance (Gi) Nausea And Vomiting    I personally reviewed active problem list, medication list, allergies, family history, social history, health maintenance with the patient/caregiver today.   ROS  Ten systems reviewed and is negative except as mentioned in HPI   Objective  Vitals:   11/13/20 1006  BP: 138/88  Pulse: 66  Resp: 16  Temp: 98.3 F (36.8 C)  SpO2: 97%  Weight: 261 lb (118.4 kg)  Height: 5' 7"  (1.702 m)    Body mass index is 40.88 kg/m.  Physical Exam  Constitutional: Patient appears well-developed and well-nourished. Obese  No distress.  HEENT: head atraumatic, normocephalic, pupils equal and reactive to light, neck supple Cardiovascular: Normal rate, regular rhythm and normal heart sounds.  No murmur heard. Trace  BLE edema. Pulmonary/Chest: Effort normal and breath sounds normal. No respiratory distress. Abdominal: Soft.  There is no tenderness. Psychiatric: Patient has a normal mood and affect. behavior is normal. Judgment and thought content normal.   Recent Results (from the past 2160 hour(s))  Comprehensive metabolic panel     Status:  Abnormal   Collection Time: 08/26/20 11:39 AM  Result Value Ref Range   Sodium 139 135 - 145 mmol/L   Potassium 3.8 3.5 - 5.1 mmol/L   Chloride 106 98 - 111 mmol/L   CO2 26 22 - 32 mmol/L   Glucose, Bld 145 (H) 70 - 99 mg/dL    Comment: Glucose reference range applies only to samples taken after fasting for at least 8 hours.   BUN 17 6 - 20 mg/dL   Creatinine, Ser 0.84 0.44 - 1.00 mg/dL   Calcium 9.3 8.9 - 10.3 mg/dL   Total Protein 7.7 6.5 - 8.1 g/dL   Albumin 3.9 3.5 - 5.0 g/dL   AST 14 (L) 15 - 41 U/L   ALT 15 0 - 44 U/L   Alkaline Phosphatase 71 38 - 126 U/L   Total Bilirubin 0.4 0.3 - 1.2 mg/dL   GFR, Estimated >60 >60 mL/min    Comment: (NOTE) Calculated using the CKD-EPI Creatinine Equation (2021)    Anion gap 7 5 - 15    Comment: Performed at [facility], [address] [place], [place] [address]  CBC with Differential/Platelet     Status: None   Collection Time: 08/26/20 11:39 AM  Result Value Ref Range   WBC 7.3 4.0 - 10.5 K/uL   RBC 4.36 3.87 - 5.11 MIL/uL   Hemoglobin 13.0 12.0 - 15.0 g/dL   HCT 40.2 36.0 - 46.0 %  MCV 92.2 80.0 - 100.0 fL   MCH 29.8 26.0 - 34.0 pg   MCHC 32.3 30.0 - 36.0 g/dL   RDW 13.9 11.5 - 15.5 %   Platelets 249 150 - 400 K/uL   nRBC 0.0 0.0 - 0.2 %   Neutrophils Relative % 66 %   Neutro Abs 4.8 1.7 - 7.7 K/uL   Lymphocytes Relative 25 %   Lymphs Abs 1.9 0.7 - 4.0 K/uL   Monocytes Relative 8 %   Monocytes Absolute 0.6 0.1 - 1.0 K/uL   Eosinophils Relative 1 %   Eosinophils Absolute 0.0 0.0 - 0.5 K/uL   Basophils Relative 0 %   Basophils Absolute 0.0 0.0 - 0.1 K/uL   Immature Granulocytes 0 %   Abs Immature Granulocytes 0.03 0.00 - 0.07 K/uL    Comment: Performed at [facility], [address] [place], [place] [address]  Basic metabolic panel     Status: Abnormal   Collection Time: 09/07/20  9:17 PM  Result Value Ref Range   Sodium 136 135 - 145 mmol/L   Potassium 3.5 3.5 - 5.1 mmol/L   Chloride 103 98 -  111 mmol/L   CO2 25 22 - 32 mmol/L   Glucose, Bld 159 (H) 70 - 99 mg/dL    Comment: Glucose reference range applies only to samples taken after fasting for at least 8 hours.   BUN 20 6 - 20 mg/dL   Creatinine, Ser 1.03 (H) 0.44 - 1.00 mg/dL   Calcium 9.2 8.9 - 10.3 mg/dL   GFR, Estimated >60 >60 mL/min    Comment: (NOTE) Calculated using the CKD-EPI Creatinine Equation (2021)    Anion gap 8 5 - 15    Comment: Performed at [facility], [address] [place], [place] [address]  CBC     Status: None   Collection Time: 09/07/20  9:17 PM  Result Value Ref Range   WBC 7.9 4.0 - 10.5 K/uL   RBC 4.61 3.87 - 5.11 MIL/uL   Hemoglobin 13.8 12.0 - 15.0 g/dL   HCT 42.2 36.0 - 46.0 %   MCV 91.5 80.0 - 100.0 fL   MCH 29.9 26.0 - 34.0 pg   MCHC 32.7 30.0 - 36.0 g/dL   RDW 14.4 11.5 - 15.5 %   Platelets 262 150 - 400 K/uL   nRBC 0.0 0.0 - 0.2 %    Comment: Performed at [facility], [place], [place] [address]  Troponin I (High Sensitivity)     Status: None   Collection Time: 09/07/20  9:17 PM  Result Value Ref Range   Troponin I (High Sensitivity) 14 <18 ng/L    Comment: (NOTE) Elevated high sensitivity troponin I (hsTnI) values and significant  changes across serial measurements may suggest ACS but many other  chronic and acute conditions are known to elevate hsTnI results.  Refer to the "Links" section for chest pain algorithms and additional  guidance. Performed at [facility], [address] [place], [place] [address]   Basic metabolic panel     Status: Abnormal   Collection Time: 09/30/20  9:03 AM  Result Value Ref Range   Sodium 139 135 - 145 mEq/L   Potassium 4.3 3.5 - 5.1 mEq/L   Chloride 104 96 - 112 mEq/L   CO2 30 19 - 32 mEq/L   Glucose, Bld 234 (H) 70 - 99 mg/dL   BUN 17 6 - 23 mg/dL   Creatinine, Ser 0.85  0.40 - 1.20 mg/dL   GFR 76.80 >60.00 mL/min    Comment: Calculated using the CKD-EPI Creatinine Equation (2021)    Calcium 9.1 8.4 - 10.5 mg/dL  Hemoglobin A1c     Status: Abnormal   Collection Time: 09/30/20  9:03 AM  Result Value Ref Range   Hgb A1c MFr Bld 8.7 (H) 4.6 - 6.5 %    Comment: Glycemic Control Guidelines for People with Diabetes:Non Diabetic:  <6%Goal of Therapy: <7%Additional Action Suggested:  >8%   Microalbumin / creatinine urine ratio     Status: Abnormal   Collection Time: 10/03/20  8:56 AM  Result Value Ref Range   Microalb, Ur 27.6 (H) 0.0 - 1.9 mg/dL   Creatinine,U 128.5 mg/dL   Microalb Creat Ratio 21.4 0.0 - 30.0 mg/g     PHQ2/9: Depression screen [facility] 2/9 11/13/2020 10/18/2020 08/20/2020 07/15/2020 06/04/2020  Decreased Interest 0 0 0 0 0  Down, Depressed, Hopeless 0 0 0 1 0  PHQ - 2 Score 0 0 0 1 0  Altered sleeping 0 0 - 1 0  Tired, decreased energy 0 0 - 1 0  Change in appetite 0 0 - 0 0  Feeling bad or failure about yourself  0 0 - 0 0  Trouble concentrating 0 0 - 0 0  Moving slowly or fidgety/restless 0 0 - 1 0  Suicidal thoughts 0 0 - 0 0  PHQ-9 Score 0 0 - 4 0  Difficult doing work/chores - - - Somewhat difficult Not difficult at all  Some recent data might be hidden    phq 9 is positive   Fall Risk: Fall Risk  11/13/2020 10/18/2020 09/26/2020 08/20/2020 07/15/2020  Falls in the past year? 1 1 1 1 1   Number falls in past yr: 1 1 1 1 1   Comment - - - - -  Injury with Fall? 0 1 1 1 1   Risk for fall due to : History of fall(s);Impaired balance/gait History of fall(s) History of fall(s);Impaired balance/gait;Impaired mobility History of fall(s);Impaired balance/gait;Impaired mobility -  Risk for fall due to: Comment - - - - -  Follow up Falls prevention discussed Falls prevention discussed Falls prevention discussed Falls evaluation completed -      Functional Status Survey: Is the patient deaf or have difficulty hearing?: No Does the patient have difficulty seeing, even when wearing glasses/contacts?: Yes Does the patient have difficulty concentrating,  remembering, or making decisions?: No Does the patient have difficulty walking or climbing stairs?: Yes Does the patient have difficulty dressing or bathing?: Yes Does the patient have difficulty doing errands alone such as visiting a doctor's office or shopping?: No    Assessment & Plan  1. Angina pectoris ([place])  On medical management she states has NTG at home and takes prn   2. Morbid obesity ([place])  Discussed with the patient the risk posed by an increased BMI. Discussed importance of portion control, calorie counting and at least 150 minutes of physical activity weekly. Avoid sweet beverages and drink more water. Eat at least 6 servings of fruit and vegetables daily    3. Need for immunization against influenza  - Flu Vaccine QUAD 6+ mos PF IM (Fluarix Quad PF)  4. Diabetic polyneuropathy associated with type 2 diabetes mellitus (HCC)  - DULoxetine (CYMBALTA) 60 MG capsule; Take 1 capsule (60 mg total) by mouth daily.  Dispense: 90 capsule; Refill: 1  5. Dyslipidemia associated with type 2 diabetes mellitus (HCC)  - ezetimibe (ZETIA) 10  MG tablet; Take 1 tablet (10 mg total) by mouth daily.  Dispense: 90 tablet; Refill: 0 - rosuvastatin (CRESTOR) 10 MG tablet; Take 1 tablet (10 mg total) by mouth daily.  Dispense: 90 tablet; Refill: 0  6. Mild episode of recurrent major depressive disorder (HCC)  - DULoxetine (CYMBALTA) 60 MG capsule; Take 1 capsule (60 mg total) by mouth daily.  Dispense: 90 capsule; Refill: 1  7. Dilated aortic root (HCC)  Under the care of Dr. Clayborn Bigness  8. Need for shingles vaccine  - Zoster Vaccine Adjuvanted [facility]) injection; Inject 0.5 mLs into the muscle once for 1 dose.  Dispense: 0.5 mL; Refill: 1  9. Chronic pain disorder  - baclofen (LIORESAL) 10 MG tablet; Take 1 tablet (10 mg total) by mouth 3 (three) times daily as needed for muscle spasms.  Dispense: 270 tablet; Refill: 0  10. Muscle spasms of both lower extremities  - baclofen  (LIORESAL) 10 MG tablet; Take 1 tablet (10 mg total) by mouth 3 (three) times daily as needed for muscle spasms.  Dispense: 270 tablet; Refill: 0  11. Gastroesophageal reflux disease without esophagitis  - pantoprazole (PROTONIX) 20 MG tablet; Take 1 tablet (20 mg total) by mouth daily.  Dispense: 90 tablet; Refill: 1

## 2020-11-18 DIAGNOSIS — I10 Essential (primary) hypertension: Secondary | ICD-10-CM

## 2020-11-26 ENCOUNTER — Other Ambulatory Visit: Payer: Self-pay

## 2020-11-26 ENCOUNTER — Ambulatory Visit
Payer: Medicare Other | Attending: Student in an Organized Health Care Education/Training Program | Admitting: Student in an Organized Health Care Education/Training Program

## 2020-11-26 ENCOUNTER — Encounter: Payer: Self-pay | Admitting: Student in an Organized Health Care Education/Training Program

## 2020-11-26 VITALS — BP 190/93 | HR 94 | Temp 96.9°F | Resp 18 | Ht 66.0 in | Wt 251.0 lb

## 2020-11-26 DIAGNOSIS — G894 Chronic pain syndrome: Secondary | ICD-10-CM | POA: Diagnosis present

## 2020-11-26 DIAGNOSIS — M791 Myalgia, unspecified site: Secondary | ICD-10-CM | POA: Diagnosis present

## 2020-11-26 DIAGNOSIS — E1142 Type 2 diabetes mellitus with diabetic polyneuropathy: Secondary | ICD-10-CM | POA: Insufficient documentation

## 2020-11-26 DIAGNOSIS — G890 Central pain syndrome: Secondary | ICD-10-CM | POA: Insufficient documentation

## 2020-11-26 MED ORDER — AMITRIPTYLINE HCL 25 MG PO TABS: 50.0000 mg | ORAL_TABLET | Freq: Every day | ORAL | 2 refills | Status: DC

## 2020-11-26 NOTE — Progress Notes (Signed)
PROVIDER NOTE: Information contained herein reflects review and annotations entered in association with encounter. Interpretation of such information and data should be left to medically-trained personnel. Information provided to patient can be located elsewhere in the medical record under "Patient Instructions". Document created using STT-dictation technology, any transcriptional errors that may result from process are unintentional.    Patient: [name]  Service Category: E/M  Provider: Gillis Santa, MD  DOB: 03-26-1965  DOS: 11/26/2020  Specialty: Interventional Pain Management  MRN: [id number]  Setting: Ambulatory outpatient  PCP: Karen Grana, [name]  Type: Established Patient    Referring Provider: Delsa Grana, [name]  Location: Office  Delivery: Face-to-face     HPI  [name], a 55 y.o. year old female, is here today because of her Diabetic polyneuropathy associated with type 2 diabetes mellitus ([place]) [E11.42]. [name] primary complain today is Back Pain and Neck Pain Last encounter: My last encounter with her was on 10/23/2020. Pertinent problems: [name] has Class 3 severe obesity with serious comorbidity and body mass index (BMI) of 40.0 to 44.9 in adult [facility]); Type 2 diabetes mellitus with hyperlipidemia ([place]); Chronic pain syndrome; Diabetic polyneuropathy associated with type 2 diabetes mellitus ([place]); and CAD (coronary artery disease) on their pertinent problem list. Pain Assessment: Severity of Chronic pain is reported as a 9 /10. Location: Neck Mid/denies. Onset: More than a month ago. Quality:  (tension). Timing: Constant. Modifying factor(s): sleeping straight on back. Vitals:  height is _0  (1.676 m) and weight is 251 lb (113.9 kg). Her temperature is 96.9 F (36.1 C) (abnormal). Her blood pressure is 190/93 (abnormal) and her pulse is 94. Her respiration is 18 and oxygen saturation is 100%.   Reason for encounter: medication management.   Ms.  [name] follows up today for face-to-face medication management.  At her last virtual visit with me, we started her on amitriptyline which she is taking 25 mg twice daily.  Instructions were to start at 25 mg nightly and increase to 50 mg nightly.  She is noticing some sedation during the day and I told her to take the amitriptyline as prescribed, 50 mg nightly.  She states that she is noticing a difference in her neuropathic pain.  Today we discussed Qutenza, capsaicin treatment for diabetic painful neuropathy of bilateral feet.  She states that she will think about this further and let us know.  Otherwise I recommend that she continue amitriptyline 50 mg nightly for the next 2 weeks and if no side effects of sedation, dry mouth, blurry vision (no anticholinergic side effects or no symptoms of serotonin syndrome) to increase to 75 mg nightly.  Patient instructed to let us know if she would like to move forward with Qutenza for lower extremity painful neuropathic pain related to diabetes.   ROS  Constitutional: Denies any fever or chills Gastrointestinal: No reported hemesis, hematochezia, vomiting, or acute GI distress Musculoskeletal:  Neck pain, low back pain Neurological:  Burning and tingling of bilateral feet  Medication Review  Accu-Chek Softclix Lancets, DULoxetine, Dulaglutide, EPINEPHrine, Omnipod DASH Pods (Gen 4), albuterol, amLODipine, amitriptyline, baclofen, budesonide, carvedilol, cholecalciferol, cloNIDine, clopidogrel, cycloSPORINE, dapagliflozin propanediol, ezetimibe, fluconazole, fluticasone, fluticasone-salmeterol, gabapentin, glucose blood, hydrALAZINE, hydrochlorothiazide, insulin lispro, levETIRAcetam, losartan, mirabegron ER, nitroGLYCERIN, nitrofurantoin, pantoprazole, polyethylene glycol, potassium chloride SA, rosuvastatin, and spironolactone  History Review  Allergy: [name] is allergic to codeine and lactose intolerance (gi). Drug: [name]  reports no history of drug  use. Alcohol:  reports current alcohol use  of about 1.0 standard drink per week. Tobacco:  reports that she quit smoking about 33 years ago. Her smoking use included cigarettes. She has a 15.00 pack-year smoking history. She has never used smokeless tobacco. Social: [name]  reports that she quit smoking about 33 years ago. Her smoking use included cigarettes. She has a 15.00 pack-year smoking history. She has never used smokeless tobacco. She reports current alcohol use of about 1.0 standard drink per week. She reports that she does not use drugs. Medical:  has a past medical history of Anxiety and depression (09/13/2006), Arthritis, Asthma, COPD (chronic obstructive pulmonary disease) ([place]), Depression, Diabetes mellitus, Diabetic neuropathy, painful ([place]), Diabetic retinopathy ([place]), Diastolic dysfunction, Dilated aortic root ([place]), Dizziness, Epilepsy idiopathic petit mal ([place]), GERD (gastroesophageal reflux disease), Glaucoma, Headache(784.0), Heart murmur, History of stress test, blood clots, Hyperlipidemia, Hypertension, Legally blind, MIGRAINE HEADACHE (09/14/2006), Myocardial infarction [facility]), Nephrolithiasis, Pain (09/23/11), Pneumonia, Pyelonephritis, Seizures ([place]), Sleep apnea, Stress incontinence, and Stroke ([place]). Surgical: [name]  has a past surgical history that includes kidney stone removal; Brain hematoma evacuation; Shunt removal; Retinal detachment surgery (Left, 1990); Vaginal hysterectomy (1996); Incisional hernia repair (05/05/2011); Mass excision (05/05/2011); PCNL; Cystoscopy/retrograde/ureteroscopy (08/24/2011); Cystoscopy with ureteroscopy (09/24/2011); Cystoscopy w/ retrogrades (09/24/2011); and RIGHT/LEFT HEART CATH AND CORONARY ANGIOGRAPHY (N/A, 04/28/2019). Family: family history includes ADD / ADHD in her maternal grandfather; Birth defects in her daughter and maternal grandfather; Brain cancer in her maternal grandmother; Cancer in her mother; Cirrhosis in her maternal grandfather;  Diabetes in her maternal grandfather; Hypertension in her daughter, maternal grandmother, and mother; Uterine cancer in her mother.  Laboratory Chemistry Profile   Renal Lab Results  Component Value Date   BUN 17 09/30/2020   CREATININE 0.85 09/30/2020   LABCREA 48 33/82/5053   BCR NOT APPLICABLE 97/67/3419   GFR 76.80 09/30/2020   GFRAA >60 05/30/2019   GFRNONAA >60 09/07/2020    Hepatic Lab Results  Component Value Date   AST 14 (L) 08/26/2020   ALT 15 08/26/2020   ALBUMIN 3.9 08/26/2020   ALKPHOS 71 08/26/2020   LIPASE 46 06/13/2020    Electrolytes Lab Results  Component Value Date   NA 139 09/30/2020   K 4.3 09/30/2020   CL 104 09/30/2020   CALCIUM 9.1 09/30/2020   MG 1.9 06/14/2020   PHOS 3.0 06/14/2020    Bone Lab Results  Component Value Date   VD25OH 26 (L) 01/24/2018    Inflammation (CRP: Acute Phase) (ESR: Chronic Phase) Lab Results  Component Value Date   CRP 2.5 11/14/2018   ESRSEDRATE 29 11/14/2018   LATICACIDVEN 0.9 02/22/2014         Note: Above Lab results reviewed.  Recent Imaging Review  US Abdomen Limited RUQ (LIVER/GB) CLINICAL DATA:  Abdominal pain.  EXAM: ULTRASOUND ABDOMEN LIMITED RIGHT UPPER QUADRANT  COMPARISON:  None.  FINDINGS: Gallbladder:  No gallstones or wall thickening visualized (2.5 mm). No sonographic Murphy sign noted by sonographer.  Common bile duct:  Diameter: 2.5 mm  Liver:  No focal lesion identified. Diffusely increased echogenicity of the liver parenchyma is noted portal vein is patent on color Doppler imaging with normal direction of blood flow towards the liver.  Other: None.  IMPRESSION: Fatty liver.  Electronically Signed   By: Virgina Norfolk M.D.   On: 09/20/2020 01:56 Note: Reviewed        Physical Exam  General appearance: Well nourished, well developed, and well hydrated. In no apparent acute distress Mental status: Alert, oriented x 3 (  person, place, & time)       Respiratory:  No evidence of acute respiratory distress Eyes: PERLA Vitals: BP (!) 190/93 (Patient Position: Sitting, Cuff Size: Large)   Pulse 94   Temp (!) 96.9 F (36.1 C)   Resp 18   Ht _0  (1.676 m)   Wt 251 lb (113.9 kg)   LMP  (LMP Unknown)   SpO2 100%   BMI 40.51 kg/m  BMI: Estimated body mass index is 40.51 kg/m as calculated from the following:   Height as of this encounter: _1  (1.676 m).   Weight as of this encounter: 251 lb (113.9 kg). Ideal: Ideal body weight: 59.3 kg (130 lb 11.7 oz) Adjusted ideal body weight: 81.1 kg (178 lb 13.4 oz)  Cervical Spine Area Exam  Skin & Axial Inspection: No masses, redness, edema, swelling, or associated skin lesions Alignment: Symmetrical Functional ROM: Pain restricted ROM      Stability: No instability detected Muscle Tone/Strength: Functionally intact. No obvious neuro-muscular anomalies detected. Sensory (Neurological): Musculoskeletal pain pattern  Upper Extremity (UE) Exam    Side: Right upper extremity  Side: Left upper extremity  Skin & Extremity Inspection: Skin color, temperature, and hair growth are WNL. No peripheral edema or cyanosis. No masses, redness, swelling, asymmetry, or associated skin lesions. No contractures.  Skin & Extremity Inspection: Skin color, temperature, and hair growth are WNL. No peripheral edema or cyanosis. No masses, redness, swelling, asymmetry, or associated skin lesions. No contractures.  Functional ROM: Unrestricted ROM          Functional ROM: Unrestricted ROM          Muscle Tone/Strength: Functionally intact. No obvious neuro-muscular anomalies detected.  Muscle Tone/Strength: Functionally intact. No obvious neuro-muscular anomalies detected.  Sensory (Neurological): Neurogenic pain pattern          Sensory (Neurological): Neurogenic pain pattern           Lumbar Spine Area Exam  Skin & Axial Inspection: No masses, redness, or swelling Alignment: Symmetrical Functional ROM: Pain restricted ROM        Stability: No instability detected Muscle Tone/Strength: Functionally intact. No obvious neuro-muscular anomalies detected. Sensory (Neurological): Neurogenic pain pattern  Lower Extremity Exam    Side: Right lower extremity  Side: Left lower extremity  Stability: No instability observed          Stability: No instability observed          Skin & Extremity Inspection: Skin color, temperature, and hair growth are WNL. No peripheral edema or cyanosis. No masses, redness, swelling, asymmetry, or associated skin lesions. No contractures.  Skin & Extremity Inspection: Skin color, temperature, and hair growth are WNL. No peripheral edema or cyanosis. No masses, redness, swelling, asymmetry, or associated skin lesions. No contractures.  Functional ROM: Unrestricted ROM                  Functional ROM: Unrestricted ROM                  Muscle Tone/Strength: Functionally intact. No obvious neuro-muscular anomalies detected.  Muscle Tone/Strength: Functionally intact. No obvious neuro-muscular anomalies detected.  Sensory (Neurological): Neurogenic pain pattern        Sensory (Neurological): Neurogenic pain pattern        DTR: Patellar: deferred today Achilles: deferred today Plantar: deferred today  DTR: Patellar: deferred today Achilles: deferred today Plantar: deferred today  Palpation: No palpable anomalies  Palpation: No palpable anomalies     Assessment  Status Diagnosis  Controlled Controlled Controlled 1. Diabetic polyneuropathy associated with type 2 diabetes mellitus ([place])   2. Central pain syndrome   3. Myalgia   4. Chronic pain syndrome       Plan of Care    [name] has a current medication list which includes the following long-term medication(s): albuterol, albuterol, amlodipine, budesonide, duloxetine, ezetimibe, fluticasone, gabapentin, hydralazine, hydrochlorothiazide, insulin lispro, levetiracetam, losartan, mirabegron er, pantoprazole, potassium  chloride sa, rosuvastatin, spironolactone, wixela inhub, and amitriptyline.  Pharmacotherapy (Medications Ordered): Meds ordered this encounter  Medications   amitriptyline (ELAVIL) 25 MG tablet    Sig: Take 2-3 tablets (50-75 mg total) by mouth at bedtime.    Dispense:  90 tablet    Refill:  2   Consider Qutenza treatment for painful diabetic neuropathy of lower extremity.   Follow-up plan:   Return in about 6 weeks (around 01/07/2021) for Medication Management, in person.    Recent Visits Date Type Provider Dept  10/23/20 Office Visit Karen Santa, MD [facility]  10/22/20 Office Visit Karen Santa, MD [facility]  Showing recent visits within past 90 days and meeting all other requirements Today's Visits Date Type Provider Dept  11/26/20 Office Visit Karen Santa, MD [facility]  Showing today's visits and meeting all other requirements Future Appointments Date Type Provider Dept  01/07/21 Appointment Karen Santa, MD [facility]  Showing future appointments within next 90 days and meeting all other requirements  I discussed the assessment and treatment plan with the patient. The patient was provided an opportunity to ask questions and all were answered. The patient agreed with the plan and demonstrated an understanding of the instructions.  Patient advised to call back or seek an in-person evaluation if the symptoms or condition worsens.  Duration of encounter: 27mnutes.  Note by: BGillis Santa MD Date: 11/26/2020; Time: 1:48 PM

## 2020-11-26 NOTE — Progress Notes (Signed)
Safety precautions to be maintained throughout the outpatient stay will include: orient to surroundings, keep bed in low position, maintain call bell within reach at all times, provide assistance with transfer out of bed and ambulation.  

## 2020-11-29 ENCOUNTER — Ambulatory Visit (INDEPENDENT_AMBULATORY_CARE_PROVIDER_SITE_OTHER): Payer: Medicare Other

## 2020-11-29 DIAGNOSIS — E1165 Type 2 diabetes mellitus with hyperglycemia: Secondary | ICD-10-CM

## 2020-11-29 DIAGNOSIS — Z9181 History of falling: Secondary | ICD-10-CM

## 2020-11-29 DIAGNOSIS — I10 Essential (primary) hypertension: Secondary | ICD-10-CM

## 2020-11-29 NOTE — Patient Instructions (Signed)
Thank you for allowing the Chronic Care Management team to participate in your care.

## 2020-11-29 NOTE — Chronic Care Management (AMB) (Addendum)
Chronic Care Management   CCM RN Visit Note  11/29/2020 Name: [name] MRN: [id number] DOB: January 16, 1966  Subjective: [name] is a 55 y.o. year old female who is a primary care patient of Delsa Grana, [place]. The care management team was consulted for assistance with disease management and care coordination needs.    Engaged with patient by telephone for follow up visit in response to provider referral for case management and care coordination services.   Consent to Services:  The patient was given information about Chronic Care Management services, agreed to services, and gave verbal consent prior to initiation of services.  Please see initial visit note for detailed documentation.    Assessment: Review of patient past medical history, allergies, medications, health status, including review of consultants reports, laboratory and other test data, was performed as part of comprehensive evaluation and provision of chronic care management services.   SDOH (Social Determinants of Health) assessments and interventions performed: No   CCM Care Plan  Allergies  Allergen Reactions   Codeine Swelling and Other (See Comments)    Swelling and burning of mouth (inside)   Lactose Intolerance (Gi) Nausea And Vomiting    Outpatient Encounter Medications as of 11/29/2020  Medication Sig   Accu-Chek Softclix Lancets lancets 4 (four) times daily.   albuterol (ACCUNEB) 1.25 MG/3ML nebulizer solution Inhale 1 ampule into the lungs every 6 (six) hours as needed for wheezing or shortness of breath.   albuterol (PROAIR HFA) 108 (90 Base) MCG/ACT inhaler INHALE 2 PUFFS BY MOUTH EVERY 6 HOURS AS NEEDED FOR WHEEZING OR SHORTNESS OF BREATH   amitriptyline (ELAVIL) 25 MG tablet Take 2-3 tablets (50-75 mg total) by mouth at bedtime.   amLODipine (NORVASC) 10 MG tablet Take 1 tablet (10 mg total) by mouth daily.   baclofen (LIORESAL) 10 MG tablet Take 1 tablet (10 mg total) by mouth 3  (three) times daily as needed for muscle spasms.   budesonide (PULMICORT) 0.5 MG/2ML nebulizer solution Inhale 0.5 mg into the lungs daily.   carvedilol (COREG) 12.5 MG tablet Take 12.5 mg by mouth 2 (two) times daily with a meal.   cholecalciferol (VITAMIN D3) 25 MCG (1000 UNIT) tablet Take 1,000 Units by mouth daily.   cloNIDine (CATAPRES) 0.1 MG tablet Take 0.1 mg by mouth daily.   clopidogrel (PLAVIX) 75 MG tablet Take 1 tablet (75 mg total) by mouth daily.   cycloSPORINE (RESTASIS) 0.05 % ophthalmic emulsion Place 2 drops into both eyes 2 (two) times daily.   Dulaglutide (TRULICITY) 4.5 DU/2.0UR SOPN Inject 4.5 mg as directed once a week.   DULoxetine (CYMBALTA) 60 MG capsule Take 1 capsule (60 mg total) by mouth daily.   EPINEPHrine 0.3 mg/0.3 mL IJ SOAJ injection Inject 0.3 mg into the muscle as needed for anaphylaxis.   ezetimibe (ZETIA) 10 MG tablet Take 1 tablet (10 mg total) by mouth daily.   FARXIGA 10 MG TABS tablet TAKE 1 TABLET BY MOUTH EVERY DAY   fluconazole (DIFLUCAN) 150 MG tablet Take 1 tablet (150 mg total) by mouth every 3 (three) days as needed (for refractory vaginal itching/yeast infection).   fluticasone (FLONASE) 50 MCG/ACT nasal spray Place 2 sprays into both nostrils daily.   gabapentin (NEURONTIN) 600 MG tablet 600 mg in the morning, 600 mg in the afternoon, 1200 mg nightly. (Patient taking differently: Take 600-1,200 mg by mouth See admin instructions. Take 1 tablet (668m) by mouth every morning and afternoon and then take 2 tablets (12036m by  mouth every night)   glucose blood (ACCU-CHEK GUIDE) test strip Use as instructed to check blood sugar 4 times per day Dx code E11.9   hydrALAZINE (APRESOLINE) 25 MG tablet Take 1 tablet (25 mg total) by mouth every 8 (eight) hours.   hydrochlorothiazide (HYDRODIURIL) 25 MG tablet Take 1 tablet (25 mg total) by mouth daily.   Insulin Disposable Pump (OMNIPOD DASH PODS, GEN 4,) MISC Change pod every 2 days   insulin lispro  (HUMALOG) 100 UNIT/ML injection USE UP TO 150 UNITS/DAY IN INSULIN PUMP   levETIRAcetam (KEPPRA) 500 MG tablet Take 500-750 mg by mouth See admin instructions. Take 1 tablet (567m) by mouth every morning and take 1 tablets (7559m by mouth every night   losartan (COZAAR) 100 MG tablet Take 1 tablet (100 mg total) by mouth daily.   mirabegron ER (MYRBETRIQ) 50 MG TB24 tablet Take 1 tablet (50 mg total) by mouth daily.   nitrofurantoin (MACRODANTIN) 50 MG capsule Take 50 mg by mouth at bedtime.   nitroGLYCERIN (NITROSTAT) 0.4 MG SL tablet Place 0.4 mg under the tongue every 5 (five) minutes as needed for chest pain.   pantoprazole (PROTONIX) 20 MG tablet Take 1 tablet (20 mg total) by mouth daily.   polyethylene glycol (MIRALAX / GLYCOLAX) 17 g packet Take 17 g by mouth daily as needed for mild constipation.   potassium chloride SA (KLOR-CON M20) 20 MEQ tablet You have not needed potassium while in the hospital.  Hold until outpatient followup. (Patient taking differently: Take 20 mEq by mouth 2 (two) times daily.)   rosuvastatin (CRESTOR) 10 MG tablet Take 1 tablet (10 mg total) by mouth daily.   spironolactone (ALDACTONE) 25 MG tablet Take 25 mg by mouth daily.   WIXELA INHUB 250-50 MCG/DOSE AEPB Inhale 1 puff into the lungs 2 (two) times daily.   No facility-administered encounter medications on file as of 11/29/2020.    Patient Active Problem List   Diagnosis Date Noted   Recurrent UTI 11/13/2020   Angina pectoris (HCWest Allis10/26/2022   Major depressive disorder, recurrent episode, moderate ([facility]10/26/2022   Weakness of right arm    Adrenal adenoma, right    PAD (peripheral artery disease) ([facility]01/05/2020   Abnormal ankle brachial index (ABI) 12/18/2019   CAD (coronary artery disease) 04/27/2019   Myalgia 04/19/2019   Diabetic polyneuropathy associated with type 2 diabetes mellitus ([facility]02/12/2018   Vitamin D deficiency 01/24/2018   Overactive bladder 05/18/2017   Dilated aortic root  (H[facility])    Sacroiliac dysfunction 10/09/2014   Chronic low back pain 09/03/2014   CVA, old, hemiparesis (HCPine [facility]wn08/15/2016   OSA (obstructive sleep apnea) 05/15/2014   Type 2 diabetes mellitus with hyperlipidemia ([facility]01/03/2014   Chronic pain syndrome 01/21/2014   Headache disorder 09/07/2013   Mixed urge and stress incontinence 08/25/2011   Ureteral stricture, right 08/25/2011   Pyelonephritis 08/22/2011   Nephrolithiasis 08/22/2011   Incisional hernia 05/10/2011   Class 3 severe obesity with serious comorbidity and body mass index (BMI) of 40.0 to 44.9 in adult (HCAndrew[facility]04/21/2013   BLINDNESS, [place]EFIN[place] 09/14/2006   Hyperlipemia 09/13/2006   Accelerated hypertension 09/13/2006   GERD 09/13/2006   Seizure disorder (HCLord[place]08/25/2008    Conditions to be addressed/monitored: HTN, HLD, and DMII Patient Care Plan: RN Care Management Plan of Care     Problem Identified: DM, HTN, HLD, COPD and Fall Risk      Long-Range Goal: Disease Progression Minimized or Managed   Start  Date: 11/29/2020  Expected End Date: 02/27/2021  Priority: High  Note:   Current Barriers:  Chronic Disease Management support and education needs related to HTN, HLD, COPD, DMII, and Fall Risk  RNCM Clinical Goal(s):  Patient will continue to work with RN Care Manager to address care management and care coordination needs related to  HTN, HLD, COPD, DMII, and Fall Risk.  Interventions: 1:1 collaboration with primary care provider regarding development and update of comprehensive plan of care as evidenced by provider attestation and co-signature Inter-disciplinary care team collaboration (see longitudinal plan of care) Evaluation of current treatment plan related to  self management and patient's adherence to plan as established by provider  Hypertension Interventions: Last practice recorded BP readings:  BP Readings from Last 3 Encounters:  11/26/20 (!) 190/93  11/13/20 138/88  10/02/20 (!) 190/100   Most recent eGFR/CrCl: No results found for: EGFR  No components found for: CRCL   Reviewed medications and compliance with treatment plan. Advised to continue taking medications as prescribed. Advised to continue adjusting doses as advised by the Cardiology team. Reviewed information regarding established blood pressure parameters along with indications for notifying a provider. Reports her readings continue to fluctuate. During her last Cardiology visit the team instructed her to complete a sleep study to obtain the needed CPAP. Reports completing the sleep study as advised. Pending follow up call from [place] regarding CPAP. Encouraged to monitor and record readings.  Reviewed compliance with recommended cardiac prudent diet. Encouraged to continue read nutrition labels, monitoring sodium intake and avoid highly processed foods when possible. Reviewed complications of uncontrolled blood pressure.  Reviewed s/sx of heart attack, stroke and worsening symptoms that require immediate medical attention.    Diabetes Interventions: Assessed patient's understanding of A1c goal: <7%  Lab Results  Component Value Date   HGBA1C 8.7 (H) 09/30/2020  Reviewed medications and compliance with treatment plan. Endorses excellent compliance with medications and insulin administration. Reviewed importance of consistent blood glucose monitoring. Reports improvements with blood glucose readings. Continues to utilize her Dexcom continuous monitor.  Reviewed s/sx of hypoglycemia and hyperglycemia along with recommended interventions. Reviewed nutritional intake and importance of complying with a modified carb/diabetic diet. Discussed available Diabetes resources. Declines need for additional nutritional or educational resources. Discussed importance of completing recommended/annual DM preventive exams.   Patient Goals/Self-Care Activities: Patient will self administer medications as prescribed Patient  will attend all scheduled provider appointments Patient will call pharmacy for medication refills Patient will continue to perform ADL's independently Patient will call provider office for new concerns or questions    Follow Up Plan:   Will follow up next month      PLAN A member of the care management team will follow up next month.   [name] Care Management [facility] [phone]

## 2020-12-02 ENCOUNTER — Encounter: Payer: Self-pay | Admitting: Family Medicine

## 2020-12-06 ENCOUNTER — Ambulatory Visit: Payer: Medicare Other | Admitting: Nurse Practitioner

## 2020-12-10 ENCOUNTER — Ambulatory Visit (INDEPENDENT_AMBULATORY_CARE_PROVIDER_SITE_OTHER): Payer: Medicare Other | Admitting: Nurse Practitioner

## 2020-12-10 ENCOUNTER — Other Ambulatory Visit: Payer: Self-pay

## 2020-12-10 ENCOUNTER — Encounter: Payer: Self-pay | Admitting: Nurse Practitioner

## 2020-12-10 VITALS — BP 142/78 | HR 98 | Temp 98.1°F | Resp 16 | Ht 66.0 in | Wt 258.8 lb

## 2020-12-10 DIAGNOSIS — L602 Onychogryphosis: Secondary | ICD-10-CM

## 2020-12-10 NOTE — Progress Notes (Signed)
   BP (!) 142/78   Pulse 98   Temp 98.1 F (36.7 C)   Resp 16   Ht 5' 6"  (1.676 m)   Wt 258 lb 12.8 oz (117.4 kg)   LMP  (LMP Unknown)   SpO2 99%   BMI 41.77 kg/m    Subjective:    Patient ID: [name], female    DOB: 1965-02-20, 55 y.o.   MRN: [id number]  HPI: [name] is a 54 y.o. female, here alone  Chief Complaint  Patient presents with   Referral    Podiatry for toenails   Left great toe nail:  She says her toe nail has been thick, discolored for awhile.  She says that her nail tech started putting acryrlic on it and it broke.  She denies any pain, or drainage. She does have diabetes so she would like to see a podiatrist.   Diabetes: Last A1C was 8.7 on 09/30/20.  She says her blood sugar has been fine.  She says it has been around 120.  She is currently taking Trulicity 4.5 mg weekly, farxiga 10 mg daily, and Humalog.  She sees Dr. Dwyane Dee (Endocrinology). Last seen on 10/02/20.  Relevant past medical, surgical, family and social history reviewed and updated as indicated. Interim medical history since our last visit reviewed. Allergies and medications reviewed and updated.  Review of Systems  Constitutional: Negative for fever or weight change.  Respiratory: Negative for cough and shortness of breath.   Cardiovascular: Negative for chest pain or palpitations.  Gastrointestinal: Negative for abdominal pain, no bowel changes.  Musculoskeletal: Negative for gait problem or joint swelling.  Skin: Negative for rash.  Left toe nail: positive for broken toe nail, discoloration and thickness Neurological: Negative for dizziness or headache.  No other specific complaints in a complete review of systems (except as listed in HPI above).      Objective:    BP (!) 142/78   Pulse 98   Temp 98.1 F (36.7 C)   Resp 16   Ht 5' 6"  (1.676 m)   Wt 258 lb 12.8 oz (117.4 kg)   LMP  (LMP Unknown)   SpO2 99%   BMI 41.77 kg/m   Wt Readings from Last 3  Encounters:  12/10/20 258 lb 12.8 oz (117.4 kg)  11/26/20 251 lb (113.9 kg)  11/13/20 261 lb (118.4 kg)    Physical Exam  Constitutional: Patient appears well-developed and well-nourished. Obese No distress.  HEENT: head atraumatic, normocephalic, pupils equal and reactive to light,  neck supple Cardiovascular: Normal rate, regular rhythm and normal heart sounds.  No murmur heard. No BLE edema. Pulmonary/Chest: Effort normal and breath sounds normal. No respiratory distress. Abdominal: Soft.  There is no tenderness. Left toe nail: white, thick nail, missing top half of nail Psychiatric: Patient has a normal mood and affect. behavior is normal. Judgment and thought content normal.   Results for orders placed or performed in visit on 10/02/20  Microalbumin / creatinine urine ratio  Result Value Ref Range   Microalb, Ur 27.6 (H) 0.0 - 1.9 mg/dL   Creatinine,U 128.5 mg/dL   Microalb Creat Ratio 21.4 0.0 - 30.0 mg/g      Assessment & Plan:   1. Onychauxis  - Ambulatory referral to Podiatry   Follow up plan: Return if symptoms worsen or fail to improve.

## 2020-12-18 ENCOUNTER — Other Ambulatory Visit: Payer: Self-pay | Admitting: Student in an Organized Health Care Education/Training Program

## 2020-12-18 DIAGNOSIS — E1165 Type 2 diabetes mellitus with hyperglycemia: Secondary | ICD-10-CM

## 2020-12-18 DIAGNOSIS — G894 Chronic pain syndrome: Secondary | ICD-10-CM

## 2020-12-18 DIAGNOSIS — E1142 Type 2 diabetes mellitus with diabetic polyneuropathy: Secondary | ICD-10-CM

## 2020-12-18 DIAGNOSIS — G890 Central pain syndrome: Secondary | ICD-10-CM

## 2020-12-18 DIAGNOSIS — Z794 Long term (current) use of insulin: Secondary | ICD-10-CM

## 2020-12-18 DIAGNOSIS — I10 Essential (primary) hypertension: Secondary | ICD-10-CM

## 2020-12-25 ENCOUNTER — Encounter: Payer: Self-pay | Admitting: Surgery

## 2020-12-25 NOTE — Telephone Encounter (Signed)
Opened in error

## 2020-12-31 ENCOUNTER — Other Ambulatory Visit: Payer: Self-pay

## 2020-12-31 ENCOUNTER — Emergency Department: Payer: Medicare Other

## 2020-12-31 ENCOUNTER — Emergency Department
Admission: EM | Admit: 2020-12-31 | Discharge: 2020-12-31 | Disposition: A | Discharge status: Home / Self Care | Payer: Medicare Other | Attending: Emergency Medicine | Admitting: Emergency Medicine

## 2020-12-31 DIAGNOSIS — Z79899 Other long term (current) drug therapy: Secondary | ICD-10-CM | POA: Diagnosis not present

## 2020-12-31 DIAGNOSIS — Z87891 Personal history of nicotine dependence: Secondary | ICD-10-CM | POA: Diagnosis not present

## 2020-12-31 DIAGNOSIS — J449 Chronic obstructive pulmonary disease, unspecified: Secondary | ICD-10-CM | POA: Insufficient documentation

## 2020-12-31 DIAGNOSIS — I2510 Atherosclerotic heart disease of native coronary artery without angina pectoris: Secondary | ICD-10-CM | POA: Insufficient documentation

## 2020-12-31 DIAGNOSIS — J45909 Unspecified asthma, uncomplicated: Secondary | ICD-10-CM | POA: Diagnosis not present

## 2020-12-31 DIAGNOSIS — I5030 Unspecified diastolic (congestive) heart failure: Secondary | ICD-10-CM | POA: Diagnosis not present

## 2020-12-31 DIAGNOSIS — Z7951 Long term (current) use of inhaled steroids: Secondary | ICD-10-CM | POA: Insufficient documentation

## 2020-12-31 DIAGNOSIS — I110 Hypertensive heart disease with heart failure: Secondary | ICD-10-CM | POA: Insufficient documentation

## 2020-12-31 DIAGNOSIS — Z794 Long term (current) use of insulin: Secondary | ICD-10-CM | POA: Insufficient documentation

## 2020-12-31 DIAGNOSIS — R519 Headache, unspecified: Secondary | ICD-10-CM | POA: Diagnosis present

## 2020-12-31 DIAGNOSIS — E1140 Type 2 diabetes mellitus with diabetic neuropathy, unspecified: Secondary | ICD-10-CM | POA: Diagnosis not present

## 2020-12-31 DIAGNOSIS — Z7902 Long term (current) use of antithrombotics/antiplatelets: Secondary | ICD-10-CM | POA: Insufficient documentation

## 2020-12-31 DIAGNOSIS — I10 Essential (primary) hypertension: Secondary | ICD-10-CM

## 2020-12-31 LAB — COMPREHENSIVE METABOLIC PANEL
ALT: 19 U/L (ref 0–44)
AST: 18 U/L (ref 15–41)
Albumin: 3.7 g/dL (ref 3.5–5.0)
Alkaline Phosphatase: 78 U/L (ref 38–126)
Anion gap: 7 (ref 5–15)
BUN: 14 mg/dL (ref 6–20)
CO2: 27 mmol/L (ref 22–32)
Calcium: 9 mg/dL (ref 8.9–10.3)
Chloride: 102 mmol/L (ref 98–111)
Creatinine, Ser: 0.8 mg/dL (ref 0.44–1.00)
GFR, Estimated: >60 mL/min (ref >60)
Glucose, Bld: 190 mg/dL — ABNORMAL HIGH (ref 70–99)
Potassium: 3.9 mmol/L (ref 3.5–5.1)
Sodium: 136 mmol/L (ref 135–145)
Total Bilirubin: 0.7 mg/dL (ref 0.3–1.2)
Total Protein: 7.3 g/dL (ref 6.5–8.1)

## 2020-12-31 LAB — CBC
HCT: 41.2 % (ref 36.0–46.0)
Hemoglobin: 13.1 g/dL (ref 12.0–15.0)
MCH: 29.2 pg (ref 26.0–34.0)
MCHC: 31.8 g/dL (ref 30.0–36.0)
MCV: 91.8 fL (ref 80.0–100.0)
Platelets: 263 10*3/uL (ref 150–400)
RBC: 4.49 MIL/uL (ref 3.87–5.11)
RDW: 14.3 % (ref 11.5–15.5)
WBC: 7.6 10*3/uL (ref 4.0–10.5)
nRBC: 0 % (ref 0.0–0.2)

## 2020-12-31 MED ORDER — BUTALBITAL-APAP-CAFFEINE 50-325-40 MG PO TABS
2.0000 | ORAL_TABLET | Freq: Once | ORAL | Status: AC
  Administered 2020-12-31: 2 via ORAL
  Filled 2020-12-31: qty 2

## 2020-12-31 MED ORDER — CLONIDINE HCL 0.1 MG PO TABS
0.2000 mg | ORAL_TABLET | Freq: Once | ORAL | Status: AC
  Administered 2020-12-31: 0.2 mg via ORAL
  Filled 2020-12-31: qty 2

## 2020-12-31 MED ORDER — BUTALBITAL-APAP-CAFFEINE 50-325-40 MG PO TABS: 1.0000 | ORAL_TABLET | Freq: Four times a day (QID) | ORAL | 0 refills | Status: DC | PRN

## 2020-12-31 MED ORDER — CLONIDINE HCL 0.1 MG PO TABS: 0.1000 mg | ORAL_TABLET | Freq: Two times a day (BID) | ORAL | 0 refills | Status: DC

## 2020-12-31 NOTE — ED Triage Notes (Signed)
Pt to ED From [facility] for HTN, pt with hx HTN, took meds this am.  C/o headache since yesterday

## 2020-12-31 NOTE — Discharge Instructions (Addendum)
Use the Fioricet medication for more severe headaches.  Use 2 pills at a time, up to 4 times per day, to treat headache.  This medication has Tylenol in it, so do not take extra Tylenol with this.  Continue all of your blood pressure medications.  I suspect your blood pressure would benefit from you taking your clonidine twice daily.  This medicine is fairly short acting, and usually does not last the entire day if you only take it once daily.  Follow-up with your PCP.  Return to the ED with any severely worsening headaches, passing out or strokelike symptoms.

## 2020-12-31 NOTE — ED Notes (Signed)
Pt to ED from [facility] for HTN. Pt took BP med this AM. Endorses 9/10 HA and intermittent blurry vision but states she usually has 7/10 HA and intermittent blurry vision. Pt ambulatory with steady gait using cane to hall bathroom.

## 2020-12-31 NOTE — ED Provider Notes (Signed)
[facility] Emergency Department Provider Note ____________________________________________   None    (approximate)  I have reviewed the triage vital signs and the nursing notes.  HISTORY  Chief Complaint Hypertension   HPI [name] is a 55 y.o. femalewho presents to the ED for evaluation of hypertension.   Chart review indicates morbidly obese patient with history of hypertension, stroke and migrainous headaches. Was seen this morning by neurosurgery due to a cervical radiculopathy, and due to hypertension while checking in she was referred to the ED for evaluation.  Patient reports that she frequently has headaches for which she takes Tylenol at home.  She reports having a more severe headache over the past couple days with frontal pressure, photophobia and nausea.  Reports using Tylenol with transient and minimal improvement of her symptoms.  Denies any fevers, neck stiffness, syncope, falls or trauma.  Reports some baseline and residual left-sided weakness from remote strokes, without any worsening.  Denies any gait changes, dropping objects from her hands, drooling.  Reports that she checks her blood pressure at home regularly, often having systolics 353-614 and diastolics 43-154.   Past Medical History:  Diagnosis Date   Anxiety and depression 09/13/2006   Qualifier: Diagnosis of  By: Hassell Done [name], [place]     Arthritis    joint pain    Asthma    COPD (chronic obstructive pulmonary disease) ([place])    chronic bronchitis    Depression    Diabetes mellitus    since age 71; type 2 IDDM   Diabetic neuropathy, painful ([place])    FEET AND HANDS   Diabetic retinopathy ([place])    Diastolic dysfunction    a.  Echo 11/17: EF 60-65%, mild LVH, no RWMA, Gr1DD, mild AI, dilated aortic root measuring 38 mm, mildly dilated ascending aorta   Dilated aortic root (HCC)    54m by echo 11/2015   Dizziness    secondary to diabetes and hypertension     Epilepsy idiopathic petit mal ([place]    last seizure 2012;controlled w/ topomax   GERD (gastroesophageal reflux disease)    Glaucoma    NOT ON ANY EYE DROPS    Headache(784.0)    migraines    Heart murmur    born with    History of stress test    a. 11/17: Normal perfusion, EF 53%, normal study   Hx of blood clots    hematomas removed from left side of brain from [id number]    Hyperlipidemia    Hypertension    Legally blind    left eye    MIGRAINE HEADACHE 09/14/2006   Qualifier: Diagnosis of  By: MarHassell DoneP, Nykedtra     [facility]  [facility].  Patient reported history of without objective documentation   Nephrolithiasis    frequent urination , urination at nite  PT SEEN IN ER 09/22/11 FOR BACK AND RT SIDED PAIN--HAS KNOWN STONE RT URETER AND UA IN ER SHOWED UTI   Pain 09/23/11   LOWER BACK AND RIGHT SIDE--PT HAS RIGHT URETERAL STONE   Pneumonia    hx of 2009   Pyelonephritis    Seizures ([facility]  Sleep ap[facility]    [facility] not [facility] ; mild   Stress incontinence    [place]roke ([place]  last 2003  RESIDUAL LEFT LEG WEAKNESS--NO OTHER RESIDUAL PROBLEMS    Patient Active Problem List   Diagnosis Date Noted  Recurrent UTI 11/13/2020   Angina pectoris ([place]) 11/13/2020   Major depressive disorder, recurrent episode, moderate ([place]) 11/13/2020   Weakness of right arm    Adrenal adenoma, right    PAD (peripheral artery disease) ([place]) 01/24/2020   Abnormal ankle brachial index (ABI) 12/18/2019   CAD (coronary artery disease) 04/27/2019   Myalgia 04/19/2019   Diabetic polyneuropathy associated with type 2 diabetes mellitus ([place]) 03/02/2018   Vitamin D deficiency 01/24/2018   Overactive bladder 05/18/2017   Dilated aortic root (HCC)    Sacroiliac dysfunction 10/09/2014   Chronic low back pain 09/03/2014   CVA, old, hemiparesis ([place]) 09/03/2014   OSA (obstructive sleep apnea) 05/15/2014   Type 2 diabetes mellitus with hyperlipidemia ([place])  01/21/2014   Chronic pain syndrome 01/21/2014   Headache disorder 09/07/2013   Mixed urge and stress incontinence 08/25/2011   Ureteral stricture, right 08/25/2011   Pyelonephritis 08/22/2011   Nephrolithiasis 08/22/2011   Incisional hernia 05/10/2011   Class 3 severe obesity with serious comorbidity and body mass index (BMI) of 40.0 to 44.9 in adult ([place]) 05/10/2011   BLINDNESS, [place], [place] DEFINITION 09/14/2006   Hyperlipemia 09/13/2006   Accelerated hypertension 09/13/2006   GERD 09/13/2006   Seizure disorder ([place]) 09/13/2006    Past Surgical History:  Procedure Laterality Date   BRAIN HEMATOMA EVACUATION     five procedures total, first procedure when 38 months old, last at 55 years of age.   CYSTOSCOPY W/ RETROGRADES  09/24/2011   Procedure: CYSTOSCOPY WITH RETROGRADE PYELOGRAM;  Surgeon: Malka So, MD;  Location: [facility];  Service: Urology;  Laterality: Right;   CYSTOSCOPY WITH URETEROSCOPY  09/24/2011   Procedure: CYSTOSCOPY WITH URETEROSCOPY;  Surgeon: Malka So, MD;  Location: [facility];  Service: Urology;  Laterality: Right;  Balloon dilation right ureter    CYSTOSCOPY/RETROGRADE/URETEROSCOPY  08/24/2011   Procedure: CYSTOSCOPY/RETROGRADE/URETEROSCOPY;  Surgeon: Molli Hazard, MD;  Location: [facility];  Service: Urology;  Laterality: Right;  Cysto, Right retrograde Pyelogram, right stent placement.    INCISIONAL HERNIA REPAIR  05/05/2011   Procedure: LAPAROSCOPIC INCISIONAL HERNIA;  Surgeon: Rolm Bookbinder, MD;  Location: [place];  Service: General;  Laterality: N/A;   kidney stone removal     MASS EXCISION  05/05/2011   Procedure: EXCISION MASS;  Surgeon: Rolm Bookbinder, MD;  Location: [place];  Service: General;  Laterality: Right;   [facility]     RETINAL DETACHMENT SURGERY Left 1990   RIGHT/LEFT HEART CATH AND CORONARY ANGIOGRAPHY N/A 04/28/2019   Procedure: RIGHT/LEFT HEART CATH AND CORONARY ANGIOGRAPHY;  Surgeon: Yolonda Kida, MD;  Location: [place] [facility];   Service: Cardiovascular;  Laterality: N/A;   SHUNT REMOVAL     shunt inserted at age 23 removed at age 18    [place]    Prior to Admission medications   Medication Sig Start Date End Date Taking? Authorizing Provider  butalbital-acetaminophen-caffeine (FIORICET) 50-325-40 MG tablet Take 1-2 tablets by mouth every 6 (six) hours as needed for headache. 12/31/20 12/31/21 Yes Vladimir Crofts, MD  Accu-Chek Softclix Lancets lancets 4 (four) times daily. 08/31/20   [provider]  albuterol (ACCUNEB) 1.25 MG/3ML nebulizer solution Inhale 1 ampule into the lungs every 6 (six) hours as needed for wheezing or shortness of breath.    [provider]  albuterol (PROAIR HFA) 108 (90 Base) MCG/ACT inhaler INHALE 2 PUFFS BY MOUTH EVERY 6 HOURS AS NEEDED FOR WHEEZING OR SHORTNESS OF BREATH 01/24/18   Hubbard Hartshorn,  [name]  amitriptyline (ELAVIL) 25 MG tablet Take 2-3 tablets (50-75 mg total) by mouth at bedtime. 11/26/20 02/24/21  Gillis Santa, MD  amLODipine (NORVASC) 10 MG tablet Take 1 tablet (10 mg total) by mouth daily. 08/08/20 11/26/20  Delsa Grana, [name]  baclofen (LIORESAL) 10 MG tablet Take 1 tablet (10 mg total) by mouth 3 (three) times daily as needed for muscle spasms. 11/13/20   Steele Sizer, MD  budesonide (PULMICORT) 0.5 MG/2ML nebulizer solution Inhale 0.5 mg into the lungs daily.    Ottie Glazier, MD  carvedilol (COREG) 12.5 MG tablet Take 12.5 mg by mouth 2 (two) times daily with a meal.    Callwood, Dwayne D, MD  cholecalciferol (VITAMIN D3) 25 MCG (1000 UNIT) tablet Take 1,000 Units by mouth daily.    [provider]  cloNIDine (CATAPRES) 0.1 MG tablet Take 0.1 mg by mouth daily. 09/10/20   Callwood, Dwayne D, MD  clopidogrel (PLAVIX) 75 MG tablet Take 1 tablet (75 mg total) by mouth daily. 10/10/19   Towanda Malkin, MD  cycloSPORINE (RESTASIS) 0.05 % ophthalmic emulsion Place 2 drops into both eyes 2 (two) times daily.    [provider]  Dulaglutide (TRULICITY) 4.5 TA/5.6PV SOPN Inject 4.5 mg as directed once a week. 10/22/20   Elayne Snare, MD  DULoxetine (CYMBALTA) 60 MG capsule Take 1 capsule (60 mg total) by mouth daily. 11/13/20   Steele Sizer, MD  EPINEPHrine 0.3 mg/0.3 mL IJ SOAJ injection Inject 0.3 mg into the muscle as needed for anaphylaxis. 03/29/19   [provider]  ezetimibe (ZETIA) 10 MG tablet Take 1 tablet (10 mg total) by mouth daily. 11/13/20   Steele Sizer, MD  FARXIGA 10 MG TABS tablet TAKE 1 TABLET BY MOUTH EVERY DAY 09/30/20   Elayne Snare, MD  fluconazole (DIFLUCAN) 150 MG tablet Take 1 tablet (150 mg total) by mouth every 3 (three) days as needed (for refractory vaginal itching/yeast infection). 10/18/20   Delsa Grana, [name]  fluticasone (FLONASE) 50 MCG/ACT nasal spray Place 2 sprays into both nostrils daily. 04/18/19   Towanda Malkin, MD  gabapentin (NEURONTIN) 600 MG tablet 600 mg in the morning, 600 mg in the afternoon, 1200 mg nightly. Patient taking differently: Take 600-1,200 mg by mouth See admin instructions. Take 1 tablet (632m) by mouth every morning and afternoon and then take 2 tablets (1209m by mouth every night 05/09/20   LaGillis SantaMD  glucose blood (ACCU-CHEK GUIDE) test strip Use as instructed to check blood sugar 4 times per day Dx code E11.9 05/27/20   KuElayne SnareMD  hydrALAZINE (APRESOLINE) 25 MG tablet Take 1 tablet (25 mg total) by mouth every 8 (eight) hours. 08/08/20 11/26/20  TaDelsa GranaPA-C  hydrochlorothiazide (HYDRODIURIL) 25 MG tablet Take 1 tablet (25 mg total) by mouth daily. 08/08/20 11/26/20  TaDelsa GranaPA-C  Insulin Disposable Pump (OMNIPOD DASH PODS, GEN 4,) MISC Change pod every 2 days 09/18/20   KuElayne SnareMD  insulin lispro (HUMALOG) 100 UNIT/ML injection USE UP TO 150 UNITS/DAY IN INSULIN PUMP 10/22/20   KuElayne SnareMD  levETIRAcetam (KEPPRA) 500 MG tablet Take 500-750 mg by mouth See admin instructions. Take 1 tablet (50092mby mouth every  morning and take 1 tablets (750m44my mouth every night    [provider]  losartan (COZAAR) 100 MG tablet Take 1 tablet (100 mg total) by mouth daily. 06/01/19   TapiDelsa Grana-C  mirabegron ER (MYRBETRIQ) 50 MG TB24 tablet Take 1  tablet (50 mg total) by mouth daily. 08/20/20   Myles Gip, DO  nitrofurantoin (MACRODANTIN) 50 MG capsule Take 50 mg by mouth at bedtime. 08/27/20   [provider]  nitroGLYCERIN (NITROSTAT) 0.4 MG SL tablet Place 0.4 mg under the tongue every 5 (five) minutes as needed for chest pain.    [provider]  pantoprazole (PROTONIX) 20 MG tablet Take 1 tablet (20 mg total) by mouth daily. 11/13/20   Steele Sizer, MD  polyethylene glycol (MIRALAX / GLYCOLAX) 17 g packet Take 17 g by mouth daily as needed for mild constipation. 06/01/19   Delsa Grana, [name]  potassium chloride SA (KLOR-CON M20) 20 MEQ tablet You have not needed potassium while in the hospital.  Hold until outpatient followup. Patient taking differently: Take 20 mEq by mouth 2 (two) times daily. 05/21/19   Enzo Bi, MD  rosuvastatin (CRESTOR) 10 MG tablet Take 1 tablet (10 mg total) by mouth daily. 11/13/20   Steele Sizer, MD  spironolactone (ALDACTONE) 25 MG tablet Take 25 mg by mouth daily.    [provider]  Grant Ruts INHUB 250-50 MCG/DOSE AEPB Inhale 1 puff into the lungs 2 (two) times daily. 01/22/20   [provider]    Allergies Codeine and Lactose intolerance (gi)  Family History  Problem Relation Age of Onset   Uterine cancer Mother    Hypertension Mother    Cancer Mother    Brain cancer Maternal Grandmother    Hypertension Maternal Grandmother    Birth defects Daughter    Hypertension Daughter    Cirrhosis Maternal Grandfather    ADD / ADHD Maternal Grandfather    Birth defects Maternal Grandfather    Diabetes Maternal Grandfather    Anesthesia problems Neg Hx    Colon cancer Neg Hx    Esophageal cancer Neg Hx    Pancreatic cancer Neg  Hx     Social History Social History   Tobacco Use   Smoking status: Former    Packs/day: 1.00    Years: 15.00    Pack years: 15.00    Types: Cigarettes    Quit date: 01/20/1987    Years since quitting: 33.9   Smokeless tobacco: Never   Tobacco comments:    quit more than 20 years  Vaping Use   Vaping Use: Never used  Substance Use Topics   Alcohol use: Yes    Alcohol/week: 1.0 standard drink    Types: 1 Glasses of wine per week   Drug use: No    Comment: hx of marijuana use no longer uses     Review of Systems  Constitutional: No fever/chills Eyes: No visual changes. ENT: No sore throat. Cardiovascular: Denies chest pain. Respiratory: Denies shortness of breath. Gastrointestinal: No abdominal pain.  No nausea, no vomiting.  No diarrhea.  No constipation. Genitourinary: Negative for dysuria. Musculoskeletal: Negative for back pain. Skin: Negative for rash. Neurological: Negative for  numbness.  Positive for headache and baseline/chronic left-sided weakness.  ____________________________________________   PHYSICAL EXAM:  VITAL SIGNS: Vitals:   12/31/20 1244 12/31/20 1339  BP: (!) 194/117 (!) 192/98  Pulse: (!) 102 91  Resp: 20 20  Temp:    SpO2: 99% 95%     Constitutional: Alert and oriented. Well appearing and in no acute distress. Eyes: Conjunctivae are normal. PERRL. EOMI. Head: Atraumatic. Nose: No congestion/rhinnorhea. Mouth/Throat: Mucous membranes are moist.  Oropharynx non-erythematous. Neck: No stridor. No cervical spine tenderness to palpation. Cardiovascular: Normal rate, regular rhythm. Grossly normal  heart sounds.  Good peripheral circulation. Respiratory: Normal respiratory effort.  No retractions. Lungs CTAB. Gastrointestinal: Soft , nondistended, nontender to palpation. No CVA tenderness. Musculoskeletal: No lower extremity tenderness nor edema.  No joint effusions. No signs of acute trauma. Neurologic:  Normal speech and language.   Left-sided weakness is present, 4/5 strength to both left arm and left leg.  She reports this is at her baseline. Sensation intact throughout. Cranial nerves intact throughout. No right-sided weakness. Skin:  Skin is warm, dry and intact. No rash noted. Psychiatric: Mood and affect are normal. Speech and behavior are normal.  ____________________________________________   LABS (all labs ordered are listed, but only abnormal results are displayed)  Labs Reviewed  COMPREHENSIVE METABOLIC PANEL - Abnormal; Notable for the following components:      Result Value   Glucose, Bld 190 (*)    All other components within normal limits  CBC   ____________________________________________  12 Lead EKG   ____________________________________________  RADIOLOGY  ED MD interpretation:    Official radiology report(s): CT HEAD WO CONTRAST (5MM)  Result Date: 12/31/2020 CLINICAL DATA:  Headache and hypertension. EXAM: CT HEAD WITHOUT CONTRAST TECHNIQUE: Contiguous axial images were obtained from the base of the skull through the vertex without intravenous contrast. COMPARISON:  MRI brain dated Jun 16, 2020. CT head dated Jun 15, 2020. FINDINGS: Brain: No evidence of acute infarction, hemorrhage, hydrocephalus, extra-axial collection or mass lesion/mass effect. Stable mild atrophy. Vascular: No hyperdense vessel or unexpected calcification. Skull: Negative for fracture or focal lesion. Sinuses/Orbits: No acute finding. Other: None. IMPRESSION: 1. No acute intracranial abnormality. Electronically Signed   By: Titus Dubin M.D.   On: 12/31/2020 13:20    ____________________________________________   PROCEDURES and INTERVENTIONS  Procedure(s) performed (including Critical Care):  Procedures  Medications  cloNIDine (CATAPRES) tablet 0.2 mg (0.2 mg Oral Given 12/31/20 1248)  butalbital-acetaminophen-caffeine (FIORICET) 50-325-40 MG per tablet 2 tablet (2 tablets Oral Given 12/31/20 1346)     ____________________________________________   MDM / ED COURSE   55 year old woman presents to the ED with acute on chronic headache, likely migrainous in etiology, without evidence of acute stroke, SAH or other acute pathology and amenable to outpatient management.  She is hypertensive, improving with another dose of clonidine.  We discussed taking her clonidine twice daily, instead of once daily, as this may be helpful for her BP.  Blood work is benign and CT head is reassuring without evidence of CVA or ICH.  She has baseline left-sided deficits without evidence of acute stroke or any superimposed deficits.  Considering her reassuring progression with providing Fioricet and reassuring work-up, anticipate she be suitable for outpatient management.  Clinical Course as of 12/31/20 1442  Tue Dec 31, 2020  1433 Reassessed.  Headache improving. [DS]    Clinical Course User Index [DS] Vladimir Crofts, MD    ____________________________________________   FINAL CLINICAL IMPRESSION(S) / ED DIAGNOSES  Final diagnoses:  Primary hypertension  Bad headache     ED Discharge Orders          Ordered    butalbital-acetaminophen-caffeine (FIORICET) 50-325-40 MG tablet  Every 6 hours PRN        12/31/20 1434             Oluwademilade Mckiver   Note:  This document was prepared using Systems analyst and may include unintentional dictation errors.    Vladimir Crofts, MD 12/31/20 [phone]

## 2020-12-31 NOTE — ED Triage Notes (Signed)
First Nurse Note:  Arrives from [facility].  C?O HTN.  Per report, BP 200/100.  Patient has history of HTN, and meds taken this morning at 0700.  Patient is AAOx3.  Skin warm and Dy .NAD

## 2021-01-01 ENCOUNTER — Other Ambulatory Visit: Payer: Self-pay | Admitting: Neurosurgery

## 2021-01-01 DIAGNOSIS — M5412 Radiculopathy, cervical region: Secondary | ICD-10-CM

## 2021-01-01 DIAGNOSIS — M542 Cervicalgia: Secondary | ICD-10-CM

## 2021-01-03 ENCOUNTER — Telehealth: Payer: Self-pay

## 2021-01-03 ENCOUNTER — Other Ambulatory Visit: Payer: Self-pay

## 2021-01-03 DIAGNOSIS — Z748 Other problems related to care provider dependency: Secondary | ICD-10-CM

## 2021-01-03 NOTE — Telephone Encounter (Signed)
Referral has been placed for transportation needs.

## 2021-01-03 NOTE — Telephone Encounter (Signed)
° °  Telephone encounter was:  Unsuccessful.  01/03/2021 Name: Dekisha Mesmer MRN: [id number] DOB: Aug 25, 1965  Unsuccessful outbound call made today to assist with:  Transportation Needs   Outreach Attempt:  1st Attempt  A HIPAA compliant voice message was left requesting a return call.  Instructed patient to call back at [phone]. Left message on voicemail for patient to return my call regarding transportation for 12/23 appointment.  Attempted to make reservation with [employer] but was told they would need to speak directly with the patient. Left my number to contact and also gave the number for [employer] [phone].  Shyna Duignan, AAS Paralegal, [place] [employer]  [address], [place] [address] ??[email or web address]   ?? [phone]   [email or web address]

## 2021-01-03 NOTE — Telephone Encounter (Signed)
Copied from [place] [id number]. Topic: General - Other >> Jan 03, 2021  1:29 PM [name] wrote: Reason for CRM: pt called in for assistance. Pt says that she need help with setting up transportation for her apt. Pt's apt is on 12/23 at 10:40 with [facility].   Please assist pt further.   [phone]

## 2021-01-06 ENCOUNTER — Other Ambulatory Visit: Payer: Self-pay

## 2021-01-06 ENCOUNTER — Encounter: Payer: Self-pay | Admitting: Endocrinology

## 2021-01-06 ENCOUNTER — Telehealth: Payer: Self-pay

## 2021-01-06 ENCOUNTER — Ambulatory Visit (INDEPENDENT_AMBULATORY_CARE_PROVIDER_SITE_OTHER): Payer: Medicare Other | Admitting: Endocrinology

## 2021-01-06 VITALS — BP 172/100 | HR 94 | Ht 66.0 in | Wt 258.8 lb

## 2021-01-06 DIAGNOSIS — Z794 Long term (current) use of insulin: Secondary | ICD-10-CM | POA: Diagnosis not present

## 2021-01-06 DIAGNOSIS — E1165 Type 2 diabetes mellitus with hyperglycemia: Secondary | ICD-10-CM | POA: Diagnosis not present

## 2021-01-06 LAB — POCT GLYCOSYLATED HEMOGLOBIN (HGB A1C): Hemoglobin A1C: 8.2 % — AB (ref 4.0–5.6)

## 2021-01-06 NOTE — Progress Notes (Signed)
Patient ID: [name], female   DOB: 01/27/1965, 55 y.o.   MRN: [id number]          Reason for Appointment: Follow-up for Type 2 Diabetes   History of Present Illness:          Date of diagnosis of type 2 diabetes mellitus:   1989      Background history:   She thinks she was initially treated with diet alone and subsequently given metformin With metformin she had nausea and diarrhea but she does not know if she was treated with any other medications until she was started on insulin in 2003.  Also was on glipizide in 2015 Most likely she was on Levemir or Lantus insulin for quite some time She was then switched to Toujeo in 2016 and although she was initially prescribed 90 units twice a day this was subsequently lowered  Her A1c has been occasionally between 7.2 and 7.5 but has been consistently over 9% since 2018  INSULIN regimen previously: 80 U Toujeo hs, Novolog 20-25 units before meals based on blood sugar     Recent history:     OMNIPOD PUMP settings:   Carbohydrate coverage 1: 150  Basal settings: 12:00 AM-12 AM = 5.05  Sensitivity 1: 50 and target 140   Non-insulin hypoglycemic drugs the patient is taking are: Trulicity 3.0 mg weekly, Farxiga 10 mg daily  Her A1c is slightly better at 8.2, previous range 7.8 -12.6  Current management, blood sugar patterns and problems identified: She has not been seen in follow-up as scheduled And last visit was over 3 months ago Recently she has not been able to get her sensor to rate on her smart phone and is using a new phone currently, no data is available  She only has some readings in her pump and may not be having all her readings from her Accu-Chek transferred  Although she was told to increase her Basaglar only to 4 units overnight and 3.7 during the day she is now using a basal rate of 5 units at all times  However she is not able to calculate her boluses accurately since she has changed her carbohydrate coverage  ratio to 150 and not clear why  She does not think she is eating consistently but not clear if she may be missing some boluses  Has only rarely checks blood sugars after meals and she also thinks postprandial readings are variable  Generally fasting readings are fairly good but only few readings available No hypoglycemia despite basal rate being 5 units/h TOTAL boluses during the day on an average only 9 units She thinks she can count carbohydrates She says she has taken her Trulicity and [place] regularly  Recent weight is stable  Current blood sugar patterns from her meter Blood sugars are mostly higher between 3-6 AM but periodically only higher in the late afternoon and evenings   Side effects from medications have been:metformin caused GI side effects  Glucose monitoring:  done 3+ times a day              Blood Glucose readings recently from her pump download show the following  Current patterns showing near normal blood sugars before 10 AM and higher at noon with average reading about 180-200 in the evenings between 6 PM-11 PM and mostly high sugars after 3 PM Time in target 50%  PRE-MEAL Morning Midday Evening Bedtime Overall  Glucose range:       Mean/median: 136 184 210  189 184+/-49     Meal times are: Bfst 7 AM, 1-2 PM and 5-6 PM                Dietician visit, most recent: 9/18 CDE visit: 01/2020 for pump start  Weight history: Previous range 219-230  Wt Readings from Last 3 Encounters:  01/06/21 258 lb 12.8 oz (117.4 kg)  12/31/20 261 lb (118.4 kg)  12/10/20 258 lb 12.8 oz (117.4 kg)    Glycemic control:    Lab Results  Component Value Date   HGBA1C 8.7 (H) 09/30/2020   HGBA1C 7.8 (H) 06/10/2020   HGBA1C 9.1 (H) 01/30/2020   Lab Results  Component Value Date   MICROALBUR 27.6 (H) 10/03/2020   LDLCALC 93 06/10/2020   CREATININE 0.80 12/31/2020   Lab Results  Component Value Date   MICRALBCREAT 21.4 10/03/2020    Lab Results  Component Value  Date   FRUCTOSAMINE 255 04/09/2020   FRUCTOSAMINE 298 (H) 01/30/2020   FRUCTOSAMINE CANCELED 07/26/2015      Allergies as of 01/06/2021       Reactions   Codeine Swelling, Other (See Comments)   Swelling and burning of mouth (inside)   Lactose Intolerance (gi) Nausea And Vomiting        Medication List        Accurate as of January 06, 2021  3:31 PM. If you have any questions, ask your nurse or doctor.          Accu-Chek Guide test strip Generic drug: glucose blood Use as instructed to check blood sugar 4 times per day Dx code E11.9   Accu-Chek Softclix Lancets lancets 4 (four) times daily.   albuterol 1.25 MG/3ML nebulizer solution Commonly known as: ACCUNEB Inhale 1 ampule into the lungs every 6 (six) hours as needed for wheezing or shortness of breath.   albuterol 108 (90 Base) MCG/ACT inhaler Commonly known as: ProAir HFA INHALE 2 PUFFS BY MOUTH EVERY 6 HOURS AS NEEDED FOR WHEEZING OR SHORTNESS OF BREATH   amitriptyline 25 MG tablet Commonly known as: ELAVIL Take 2-3 tablets (50-75 mg total) by mouth at bedtime.   amLODipine 10 MG tablet Commonly known as: NORVASC Take 1 tablet (10 mg total) by mouth daily.   amLODipine 10 MG tablet Commonly known as: NORVASC Take 1 tablet by mouth daily.   baclofen 10 MG tablet Commonly known as: LIORESAL Take 1 tablet (10 mg total) by mouth 3 (three) times daily as needed for muscle spasms.   budesonide 0.5 MG/2ML nebulizer solution Commonly known as: PULMICORT Inhale 0.5 mg into the lungs daily.   butalbital-acetaminophen-caffeine 50-325-40 MG tablet Commonly known as: FIORICET Take 1-2 tablets by mouth every 6 (six) hours as needed for headache.   carvedilol 12.5 MG tablet Commonly known as: COREG Take 12.5 mg by mouth 2 (two) times daily with a meal.   cholecalciferol 25 MCG (1000 UNIT) tablet Commonly known as: VITAMIN D3 Take 1,000 Units by mouth daily.   cloNIDine 0.1 MG tablet Commonly known  as: CATAPRES Take 0.1 mg by mouth daily.   cloNIDine 0.1 MG tablet Commonly known as: Catapres Take 1 tablet (0.1 mg total) by mouth 2 (two) times daily.   clopidogrel 75 MG tablet Commonly known as: PLAVIX Take 1 tablet (75 mg total) by mouth daily.   cycloSPORINE 0.05 % ophthalmic emulsion Commonly known as: RESTASIS Place 2 drops into both eyes 2 (two) times daily.   DULoxetine 60 MG capsule Commonly known as: Cymbalta Take  1 capsule (60 mg total) by mouth daily.   EPINEPHrine 0.3 mg/0.3 mL Soaj injection Commonly known as: EPI-PEN Inject 0.3 mg into the muscle as needed for anaphylaxis.   ezetimibe 10 MG tablet Commonly known as: Zetia Take 1 tablet (10 mg total) by mouth daily.   Farxiga 10 MG Tabs tablet Generic drug: dapagliflozin propanediol TAKE 1 TABLET BY MOUTH EVERY DAY   fluconazole 150 MG tablet Commonly known as: DIFLUCAN Take 1 tablet (150 mg total) by mouth every 3 (three) days as needed (for refractory vaginal itching/yeast infection).   fluticasone 50 MCG/ACT nasal spray Commonly known as: FLONASE Place 2 sprays into both nostrils daily.   gabapentin 600 MG tablet Commonly known as: Neurontin 600 mg in the morning, 600 mg in the afternoon, 1200 mg nightly. What changed:  how much to take how to take this when to take this additional instructions   hydrALAZINE 25 MG tablet Commonly known as: APRESOLINE Take 1 tablet (25 mg total) by mouth every 8 (eight) hours.   hydrochlorothiazide 25 MG tablet Commonly known as: HYDRODIURIL Take 1 tablet (25 mg total) by mouth daily.   insulin lispro 100 UNIT/ML injection Commonly known as: HumaLOG USE UP TO 150 UNITS/DAY IN INSULIN PUMP What changed:  how much to take when to take this   levETIRAcetam 500 MG tablet Commonly known as: KEPPRA Take 500-750 mg by mouth See admin instructions. Take 1 tablet (558m) by mouth every morning and take 1 tablets (7578m by mouth every night   losartan 100  MG tablet Commonly known as: COZAAR Take 1 tablet (100 mg total) by mouth daily.   mirabegron ER 50 MG Tb24 tablet Commonly known as: Myrbetriq Take 1 tablet (50 mg total) by mouth daily.   nitrofurantoin 50 MG capsule Commonly known as: MACRODANTIN Take 50 mg by mouth at bedtime.   nitroGLYCERIN 0.4 MG SL tablet Commonly known as: NITROSTAT Place 0.4 mg under the tongue every 5 (five) minutes as needed for chest pain.   Omnipod DASH Pods (Gen 4) Misc Change pod every 2 days   pantoprazole 20 MG tablet Commonly known as: PROTONIX Take 1 tablet (20 mg total) by mouth daily.   polyethylene glycol 17 g packet Commonly known as: MIRALAX / GLYCOLAX Take 17 g by mouth daily as needed for mild constipation.   potassium chloride SA 20 MEQ tablet Commonly known as: Klor-Con M20 You have not needed potassium while in the hospital.  Hold until outpatient followup. What changed:  how much to take how to take this when to take this additional instructions   rosuvastatin 10 MG tablet Commonly known as: Crestor Take 1 tablet (10 mg total) by mouth daily.   spironolactone 25 MG tablet Commonly known as: ALDACTONE Take 25 mg by mouth daily.   Trulicity 4.5 MGEH/2.0NOopn Generic drug: Dulaglutide Inject 4.5 mg as directed once a week.   Wixela Inhub 250-50 MCG/ACT Aepb Generic drug: fluticasone-salmeterol Inhale 1 puff into the lungs 2 (two) times daily.        Allergies:  Allergies  Allergen Reactions   Codeine Swelling and Other (See Comments)    Swelling and burning of mouth (inside)   Lactose Intolerance (Gi) Nausea And Vomiting    Past Medical History:  Diagnosis Date   Anxiety and depression 09/13/2006   Qualifier: Diagnosis of  By: MaHassell DoneNP, Nykedtra     Arthritis    joint pain    Asthma    COPD (chronic obstructive pulmonary  disease) ([place])    chronic bronchitis    Depression    Diabetes mellitus    since age 84; type 2 IDDM   Diabetic neuropathy,  painful ([name])    FEET AND HANDS   Diabetic retinopathy ([place])    Diastolic dysfunction    a.  Echo 11/17: EF 60-65%, mild LVH, no RWMA, Gr1DD, mild AI, dilated aortic root measuring 38 mm, mildly dilated ascending aorta   Dilated aortic root ([facility])    53m by echo 11/2015   Dizziness    secondary to diabetes and hypertension    Epilepsy idiopathic petit mal ([facility]    last seizure 2012;controlled w/ topomax   GERD (gastroesophageal reflux disease)    Glaucoma    NOT ON ANY EYE DROPS    Headache(784.0)    migraines    Heart murmur    born with    History of stress test    a. 11/17: Normal perfusion, EF 53%, normal study   Hx of blood clots    hematomas removed from left side of brain from [id number]    Hyperlipidemia    Hypertension    Legally blind    left eye    MIGRAINE HEADACHE 09/14/2006   Qualifier: Diagnosis of  By: MarHassell DoneP, Nykedtra     [facility]  [facility]  Patient reported history of without objective documentation   Nephrolithiasis    frequent urination , urination at nite  PT SEEN IN ER 09/22/11 FOR BACK AND RT SIDED PAIN--HAS KNOWN STONE RT URETER AND UA IN ER SHOWED UTI   Pain 09/23/11   LOWER BACK AND RIGHT SIDE--PT HAS RIGHT URETERAL STONE   Pneumonia    hx of 2009   Pyelonephritis    Seizures ([facility]  Sleep ap[facility]    [facility] ; mild   Stress incontinence    [place]roke ([facility]  last 2003  RESIDUAL LEFT LEG WEAKNESS--NO OTHER RESIDUAL PROBLEMS    Past Surgical History:  Procedure Laterality Date   BRAIN HEMATOMA EVACUATION     five procedures total, first procedure when 10 94 monthsd, last at 4 y67ars of age.   CYSTOSCOPY W/ RETROGRADES  09/24/2011   Procedure: CYSTOSCOPY WITH RETROGRADE PYELOGRAM;  Surgeon: JohMalka SoD;  Location: [facility];  Service: Urology;  Laterality: Right;   CYSTOSCOPY WITH URETEROSCOPY  09/24/2011   Procedure: CYSTOSCOPY WITH URETEROSCOPY;  Surgeon: JohMalka SoD;  Location: [facility];  Service: Urology;  Laterality: Right;  Balloon dilation right ureter    CYSTOSCOPY/RETROGRADE/URETEROSCOPY  08/24/2011   Procedure: CYSTOSCOPY/RETROGRADE/URETEROSCOPY;  Surgeon: DanMolli HazardD;  Location: [facility];  Service: Urology;  Laterality: Right;  Cysto, Right retrograde Pyelogram, right stent placement.    INCISIONAL HERNIA REPAIR  05/05/2011   Procedure: LAPAROSCOPIC INCISIONAL HERNIA;  Surgeon: MatRolm BookbinderD;  [facility];  Laterality: N/A;   kidney stone removal     MASS EXCISION  05/05/2011   Procedure: EXCISION MASS;  Surgeon: MatRolm BookbinderD;  [facility];  Laterality: Right;   PC[facility]     RETINAL DETACHMENT SURGERY Left 1990   RIGHT/LEFT HEART CATH AND CORONARY ANGIOGRAPHY N/A 04/28/2019   Procedure: RIGHT/LEFT HEART CATH AND CORONARY ANGIOGRAPHY;  Surgeon: CalYolonda KidaD;  [facility];  Service: Cardiovascular;  Laterality: N/A;   SHUNT REMOVAL  shunt inserted at age 21 removed at age 72    [place]    Family History  Problem Relation Age of Onset   Uterine cancer Mother    Hypertension Mother    Cancer Mother    Brain cancer Maternal Grandmother    Hypertension Maternal Grandmother    Birth defects Daughter    Hypertension Daughter    Cirrhosis Maternal Grandfather    ADD / ADHD Maternal Grandfather    Birth defects Maternal Grandfather    Diabetes Maternal Grandfather    Anesthesia problems Neg Hx    Colon cancer Neg Hx    Esophageal cancer Neg Hx    Pancreatic cancer Neg Hx     Social History:  reports that she quit smoking about 33 years ago. Her smoking use included cigarettes. She has a 15.00 pack-year smoking history. She has never used smokeless tobacco. She reports current alcohol use of about 1.0 standard drink per week. She reports that she does not use drugs.   Review of Systems   Lipid history: Is being treated with simvastatin 40 mg  daily    Lab Results  Component Value Date   CHOL 169 06/10/2020   HDL 41.10 06/10/2020   LDLCALC 93 06/10/2020   TRIG 172.0 (H) 06/10/2020   CHOLHDL 4 06/10/2020           Hypertension: She appears to have severe hypertension which is more recently poorly controlled She says she has been managed by her cardiologist on a regular basis a  Currently taking Coreg, Losartan 100 mg, hydralazine 25 mg 3 times daily, amlodipine, aldactone 25 mg, HCTZ per cardiologist  However blood pressure readings are still very labile She does have an adrenal adenoma  Not sure if she has had evaluation for this elsewhere Previously in the hospital her aldosterone level was normal  BP Readings from Last 3 Encounters:  01/06/21 (!) 172/100  12/31/20 (!) 192/98  12/10/20 (!) 142/78   Hypokalemia: She has been given  25 mg spironolactone for history of hypokalemia and no potassium supplements  Lab Results  Component Value Date   K 3.9 12/31/2020    Most recent eye exam was in 8/21  Most recent foot exam: 8/67  Diabetic complications: History of neuropathy, microalbuminuria and background retinopathy  She will occasionally take prednisone for her asthma  LABS:  Admission on 12/31/2020, Discharged on 12/31/2020  Component Date Value Ref Range Status   WBC 12/31/2020 7.6  4.0 - 10.5 K/uL Final   RBC 12/31/2020 4.49  3.87 - 5.11 MIL/uL Final   Hemoglobin 12/31/2020 13.1  12.0 - 15.0 g/dL Final   HCT 12/31/2020 41.2  36.0 - 46.0 % Final   MCV 12/31/2020 91.8  80.0 - 100.0 fL Final   MCH 12/31/2020 29.2  26.0 - 34.0 pg Final   MCHC 12/31/2020 31.8  30.0 - 36.0 g/dL Final   RDW 12/31/2020 14.3  11.5 - 15.5 % Final   Platelets 12/31/2020 263  150 - 400 K/uL Final   nRBC 12/31/2020 0.0  0.0 - 0.2 % Final   Performed at [facility], [place], [place] [address]   Sodium 12/31/2020 136  135 - 145 mmol/L Final   Potassium 12/31/2020 3.9  3.5 - 5.1 mmol/L Final    Chloride 12/31/2020 102  98 - 111 mmol/L Final   CO2 12/31/2020 27  22 - 32 mmol/L Final   Glucose, Bld 12/31/2020 190 (H)  70 -  99 mg/dL Final   Glucose reference range applies only to samples taken after fasting for at least 8 hours.   BUN 12/31/2020 14  6 - 20 mg/dL Final   Creatinine, Ser 12/31/2020 0.80  0.44 - 1.00 mg/dL Final   Calcium 12/31/2020 9.0  8.9 - 10.3 mg/dL Final   Total Protein 12/31/2020 7.3  6.5 - 8.1 g/dL Final   Albumin 12/31/2020 3.7  3.5 - 5.0 g/dL Final   AST 12/31/2020 18  15 - 41 U/L Final   ALT 12/31/2020 19  0 - 44 U/L Final   Alkaline Phosphatase 12/31/2020 78  38 - 126 U/L Final   Total Bilirubin 12/31/2020 0.7  0.3 - 1.2 mg/dL Final   GFR, Estimated 12/31/2020 >60  >60 mL/min Final   Comment: (NOTE) Calculated using the CKD-EPI Creatinine Equation (2021)    Anion gap 12/31/2020 7  5 - 15 Final   Performed at [facility], [place], [place] [address]    Physical Examination:  BP (!) 172/100    Pulse 94    Ht 5' 6"  (1.676 m)    Wt 258 lb 12.8 oz (117.4 kg)    LMP  (LMP Unknown)    SpO2 97%    BMI 41.77 kg/m       ASSESSMENT:  Diabetes type 2, uncontrolled, insulin-dependent  See history of present illness for detailed discussion of current diabetes management, blood sugar patterns and problems identified     Her A1c is slightly better at 8.2 although has been below 8 at times   She is on insulin pump since 1/22 using Humalog with Dash OmniPod pump Also on Farxiga and Trulicity 3 mg weekly  Her blood sugars were assessed from the pump download and very few readings are available for evaluating consistent pattern She is mostly entering her blood sugars when she is taking the mealtime bolus and not otherwise even when blood sugars are high Because of her on her own changing her settings she has a carbohydrate ratio listed at 1: 150, with this her pump is not able to calculate a correction bolus at meal time However with  taking basal of 5 units/h her blood sugars overall better  Her pump settings were reviewed in detail and changed in the office, she likely needs to avoid changing settings on her arm  She has significantly high readings despite now taking 80 units basal However she is getting only 2 to 3 units of bolus insulin and only sporadically She tries to change her settings on the pump on her own and some of these are inappropriate  Hypertension: She has severe hypertension and was being evaluated elsewhere for secondary hypertension Medications have been also adjusted elsewhere Currently her spironolactone dose is only 25 mg   PLAN:    She was shown how to do her boluses including mealtime boluses and correction factor She can empirically calculated boluses for food based on 1: 10 carbohydrate ratio but also enter blood sugar at the same time Make sure she is using her pump consistently Bolus for all meals and snacks Take boluses for correction even when not eating She will change her correction factor to 1: 30 More regular follow-up She will start back on her freestyle libre and bring her data for review on the next visit Continue Trulicity and [place]  Patient Instructions  Change ISF or correction to 30   Total visit time including counseling = 30 minutes  Elayne Snare 01/06/2021,  3:31 PM   Note: This office note was prepared with Estate agent. Any transcriptional errors that result from this process are unintentional.

## 2021-01-06 NOTE — Patient Instructions (Signed)
Change ISF or correction to 30

## 2021-01-06 NOTE — Telephone Encounter (Signed)
° °  Telephone encounter was:  Successful.  01/06/2021 Name: [name] MRN: [id number] DOB: 1965-09-02  [name] is a 55 y.o. year old female who is a primary care patient of Delsa Grana, [place] . The community resource team was consulted for assistance with Transportation Needs   Care guide performed the following interventions: Spoke with Christian at [facility] transportation  patient is scheduled for pickup at 10am 12/23. Spoke with patient to let her know that [facility] transportation will pick her up at 10 am 12/23 and she will call [phone] for pickup after her appointment to be taken home.  Follow Up Plan:  No further follow up planned at this time. The patient has been provided with needed resources.  Janissa Bertram, AAS Paralegal, [employer]  [address], [place] [address] ??[email or web address]   ?? [phone]   [email or web address]

## 2021-01-07 ENCOUNTER — Encounter: Payer: Self-pay | Admitting: Student in an Organized Health Care Education/Training Program

## 2021-01-07 ENCOUNTER — Ambulatory Visit
Payer: Medicare Other | Attending: Student in an Organized Health Care Education/Training Program | Admitting: Student in an Organized Health Care Education/Training Program

## 2021-01-07 DIAGNOSIS — E1142 Type 2 diabetes mellitus with diabetic polyneuropathy: Secondary | ICD-10-CM

## 2021-01-07 DIAGNOSIS — G894 Chronic pain syndrome: Secondary | ICD-10-CM

## 2021-01-07 DIAGNOSIS — G890 Central pain syndrome: Secondary | ICD-10-CM | POA: Diagnosis not present

## 2021-01-07 DIAGNOSIS — M791 Myalgia, unspecified site: Secondary | ICD-10-CM

## 2021-01-07 MED ORDER — GABAPENTIN 600 MG PO TABS: ORAL_TABLET | ORAL | 5 refills | Status: DC

## 2021-01-07 MED ORDER — AMITRIPTYLINE HCL 25 MG PO TABS: 50.0000 mg | ORAL_TABLET | Freq: Every day | ORAL | 5 refills | Status: DC

## 2021-01-07 NOTE — Progress Notes (Signed)
Patient: [name]  Service Category: E/M  Provider: Gillis Santa, MD  DOB: [birth year]  DOS: 01/07/2021  Location: Office  MRN: [id number]  Setting: Ambulatory outpatient  Referring Provider: Delsa Grana, [name]  Type: Established Patient  Specialty: Interventional Pain Management  PCP: Delsa Grana, [name]  Location: Remote location  Delivery: TeleHealth     Virtual Encounter - Pain Management PROVIDER NOTE: Information contained herein reflects review and annotations entered in association with encounter. Interpretation of such information and data should be left to medically-trained personnel. Information provided to patient can be located elsewhere in the medical record under "Patient Instructions". Document created using STT-dictation technology, any transcriptional errors that may result from process are unintentional.    Contact & Pharmacy Preferred: [phone] Home: [phone] (home) Mobile: [phone] (mobile) E-mail: [email or web address]  [id number]: [phone]: [phone]  [facility] [place] [id number]: [phone]: [phone]  Pre-screening  [name] offered "in-person" vs "virtual" encounter. She indicated preferring virtual for this encounter.   Reason COVID-19*   Social distancing based on CDC and AMA recommendations.   I contacted [name] 01/07/2021 via telephone.      I clearly identified myself as BGillis Santa MD. I verified that I was speaking with the correct person using two identifiers (Name: [name] and date of birth: 9Mar 23, 1967.  Consent I sought verbal advanced consent from Karen Evenerfor virtual visit interactions. I informed [name] possible security and privacy concerns, risks, and limitations associated with providing "not-in-person"  medical evaluation and management services. I also informed Ms. MSpakeof the availability of "in-person" appointments. Finally, I informed her that there would be a charge for the virtual visit and that she could be  personally, fully or partially, financially responsible for it. [name] understanding and agreed to proceed.   Historic Elements   Ms. LJamya Starryis a 55y.o. year old, female patient evaluated today after our last contact on 12/18/2020. [name] has a past medical history of Anxiety and depression (09/13/2006), Arthritis, Asthma, COPD (chronic obstructive pulmonary disease) ([place], Depression, Diabetes mellitus, Diabetic neuropathy, painful ([place], Diabetic retinopathy (H[place], Diastolic dysfunction, Dilated aortic root (HCl[place], Dizziness, Epilepsy idiopathic petit mal ([place], GERD (gastroesophageal reflux disease), Glaucoma, Headache(784.0), Heart murmur, History of stress test, blood clots, Hyperlipidemia, Hypertension, Legally blind, MIGRAINE HEADACHE (09/14/2006), Myocardial infarction (HE[place], Nephrolithiasis, Pain (09/23/11), Pneumonia, Pyelonephritis, Seizures ([place], Sleep apnea, Stress incontinence, and Stroke ([place]. She also  has a past surgical history that includes kidney stone removal; Brain hematoma evacuation; Shunt removal; Retinal detachment surgery (Left, 1990); Vaginal hysterectomy (1996); Incisional hernia repair (05/05/2011); Mass excision (05/05/2011); PCNL; Cystoscopy/retrograde/ureteroscopy (08/24/2011); Cystoscopy with ureteroscopy (09/24/2011); Cystoscopy w/ retrogrades (09/24/2011); and RIGHT/LEFT HEART CATH AND CORONARY ANGIOGRAPHY (N/A, 04/28/2019). Ms. MUgartehas a current medication list which includes the following prescription(s): accu-chek softclix lancets, albuterol, albuterol, amlodipine, amlodipine, baclofen, budesonide, butalbital-acetaminophen-caffeine, carvedilol, cholecalciferol, clonidine, clopidogrel, cyclosporine, trulicity, duloxetine, epinephrine,  ezetimibe, farxiga, fluconazole, fluticasone, accu-chek guide, hydralazine, hydrochlorothiazide, omnipod dash pods (gen 4), insulin lispro, levetiracetam, losartan, mirabegron er, nitrofurantoin, nitroglycerin, pantoprazole, polyethylene glycol, potassium chloride sa, rosuvastatin, spironolactone, wixela inhub, amitriptyline, clonidine, and gabapentin. She  reports that she quit smoking about 33 years ago. Her smoking use included cigarettes. She has a 15.00 pack-year smoking history. She has never used smokeless tobacco. She  reports current alcohol use of about 1.0 standard drink per week. She reports that she does not use drugs. [name] is allergic to codeine and lactose intolerance (gi).   HPI  Today, she is being contacted for medication management.  Patient overall is doing well and finding benefit with amitriptyline and gabapentin.  Instructed to continue.  Qutenza therapy still an option for her for painful diabetic polyneuropathy and paresthesias of the feet however the patient wants to hold off at this time.  Should she have worsening paresthesias of her feet we can consider Qutenza treatment.  Laboratory Chemistry Profile   Renal Lab Results  Component Value Date   BUN 14 12/31/2020   CREATININE 0.80 12/31/2020   LABCREA 48 26/33/3545   BCR NOT APPLICABLE 62/56/3893   GFR 76.80 09/30/2020   GFRAA >60 05/30/2019   GFRNONAA >60 12/31/2020    Hepatic Lab Results  Component Value Date   AST 18 12/31/2020   ALT 19 12/31/2020   ALBUMIN 3.7 12/31/2020   ALKPHOS 78 12/31/2020   LIPASE 46 06/13/2020    Electrolytes Lab Results  Component Value Date   NA 136 12/31/2020   K 3.9 12/31/2020   CL 102 12/31/2020   CALCIUM 9.0 12/31/2020   MG 1.9 06/14/2020   PHOS 3.0 06/14/2020    Bone Lab Results  Component Value Date   VD25OH 26 (L) 01/24/2018    Inflammation (CRP: Acute Phase) (ESR: Chronic Phase) Lab Results  Component Value Date   CRP 2.5 11/14/2018   ESRSEDRATE 29  11/14/2018   LATICACIDVEN 0.9 02/22/2014         Note: Above Lab results reviewed.  Imaging  CT HEAD WO CONTRAST (5MM) CLINICAL DATA:  Headache and hypertension.  EXAM: CT HEAD WITHOUT CONTRAST  TECHNIQUE: Contiguous axial images were obtained from the base of the skull through the vertex without intravenous contrast.  COMPARISON:  MRI brain dated Jun 16, 2020. CT head dated Jun 15, 2020.  FINDINGS: Brain: No evidence of acute infarction, hemorrhage, hydrocephalus, extra-axial collection or mass lesion/mass effect. Stable mild atrophy.  Vascular: No hyperdense vessel or unexpected calcification.  Skull: Negative for fracture or focal lesion.  Sinuses/Orbits: No acute finding.  Other: None.  IMPRESSION: 1. No acute intracranial abnormality.  Electronically Signed   By: Titus Dubin CalderonD.   On: 12/31/2020 13:20  Assessment  The primary encounter diagnosis was Diabetic polyneuropathy associated with type 2 diabetes mellitus ([place]). Diagnoses of Central pain syndrome, Myalgia, and Chronic pain syndrome were also pertinent to this visit.  Plan of Care  Problem-specific:  No problem-specific Assessment & Plan notes found for this encounter.  [name] has a current medication list which includes the following long-term medication(s): albuterol, albuterol, amlodipine, amlodipine, budesonide, clonidine, duloxetine, ezetimibe, fluticasone, hydralazine, hydrochlorothiazide, insulin lispro, levetiracetam, losartan, mirabegron er, pantoprazole, potassium chloride sa, rosuvastatin, spironolactone, wixela inhub, amitriptyline, clonidine, and gabapentin.  Pharmacotherapy (Medications Ordered): Meds ordered this encounter  Medications   amitriptyline (ELAVIL) 25 MG tablet    Sig: Take 2-3 tablets (50-75 mg total) by mouth at bedtime.    Dispense:  90 tablet    Refill:  5   gabapentin (NEURONTIN) 600 MG tablet    Sig: 600 mg qAM, 600 mg qPM, 1200 mg QHS     Dispense:  120 tablet    Refill:  5   Orders:  No orders of the defined types were placed in this encounter.  Follow-up plan:   Return in about 6  months (around 07/08/2021) for Medication Management, in person.    Recent Visits Date Type Provider Dept  11/26/20 Office Visit Gillis Santa, MD [facility]  10/23/20 Office Visit Gillis Santa, MD [facility]  10/22/20 Office Visit Gillis Santa, MD [facility]  Showing recent visits within past 90 days and meeting all other requirements Today's Visits Date Type Provider Dept  01/07/21 Office Visit Gillis Santa, MD [facility]  Showing today's visits and meeting all other requirements Future Appointments No visits were found meeting these conditions. Showing future appointments within next 90 days and meeting all other requirements  I discussed the assessment and treatment plan with the patient. The patient was provided an opportunity to ask questions and all were answered. The patient agreed with the plan and demonstrated an understanding of the instructions.  Patient advised to call back or seek an in-person evaluation if the symptoms or condition worsens.  Duration of encounter: 82mnutes.  Note by: BGillis Santa MD Date: 01/07/2021; Time: 2:47 PM

## 2021-01-10 ENCOUNTER — Ambulatory Visit (INDEPENDENT_AMBULATORY_CARE_PROVIDER_SITE_OTHER): Payer: Medicare Other | Admitting: Nurse Practitioner

## 2021-01-10 ENCOUNTER — Encounter: Payer: Self-pay | Admitting: Endocrinology

## 2021-01-10 ENCOUNTER — Encounter: Payer: Self-pay | Admitting: Nurse Practitioner

## 2021-01-10 VITALS — BP 174/100 | HR 96 | Temp 98.3°F | Resp 16 | Ht 66.0 in | Wt 264.2 lb

## 2021-01-10 DIAGNOSIS — E1159 Type 2 diabetes mellitus with other circulatory complications: Secondary | ICD-10-CM | POA: Diagnosis not present

## 2021-01-10 DIAGNOSIS — I152 Hypertension secondary to endocrine disorders: Secondary | ICD-10-CM | POA: Diagnosis not present

## 2021-01-10 DIAGNOSIS — I10 Essential (primary) hypertension: Secondary | ICD-10-CM | POA: Diagnosis not present

## 2021-01-10 MED ORDER — OLMESARTAN MEDOXOMIL-HCTZ 40-25 MG PO TABS: 1.0000 | ORAL_TABLET | Freq: Every day | ORAL | 0 refills | Status: DC

## 2021-01-10 NOTE — Progress Notes (Signed)
BP (!) 182/102    Pulse 96    Temp 98.3 F (36.8 C) (Oral)    Resp 16    Ht 5' 6"  (1.676 m)    Wt 264 lb 3.2 oz (119.8 kg)    LMP  (LMP Unknown)    SpO2 99%    BMI 42.64 kg/m    Subjective:    Patient ID: [name], female    DOB: November 24, 1965, 55 y.o.   MRN: [id number]  HPI: [name] is a 55 y.o. female, here alone  Chief Complaint  Patient presents with   Follow-up   Hypertension   HTN: Today her blood pressure is 182/102.  Recheck was 174/100. She denies any chest pain, shortness of breath, headache, or blurred vision.  She says she feels fine. She says she is currently taking amlodipine 10 mg daily, clonidine 0.1 mg BID, hydrochlorothiazide 25 mg, hydralazine 25 mg, carvedilol 12.5 mg, losartan 100 mg.  She says she had an elevated blood pressure when she went to neurosurgeon on 12/31/20, and it was 230/123.  They called Dr Clayborn Bigness and she was sent to the emergency room. Blood pressure in the emergency room was 194/117 and 192/98. She had a headache was given Fioricet and clonidine. They did not change any of her medications. Last saw Dr. Clayborn Bigness (cardiology) on 10/31/20.  Discussed with patient to follow up with Dr. Holley Raring in the next two weeks. Discussed changing her medication.  Will stop losartan and hydrochlorothiazide and give combo Benicar. Patient is agreeable with plan. Discussed signs and symptoms of hypertensive emergency and when to seek emergency care.    Relevant past medical, surgical, family and social history reviewed and updated as indicated. Interim medical history since our last visit reviewed. Allergies and medications reviewed and updated.  Review of Systems  Constitutional: Negative for fever or weight change.  Respiratory: Negative for cough and shortness of breath.   Cardiovascular: Negative for chest pain or palpitations.  Gastrointestinal: Negative for abdominal pain, no bowel changes.  Musculoskeletal: Positive for gait problem,  uses cane, negative for  joint swelling.  Skin: Negative for rash.  Neurological: Negative for dizziness or headache.  No other specific complaints in a complete review of systems (except as listed in HPI above).      Objective:    BP (!) 182/102    Pulse 96    Temp 98.3 F (36.8 C) (Oral)    Resp 16    Ht 5' 6"  (1.676 m)    Wt 264 lb 3.2 oz (119.8 kg)    LMP  (LMP Unknown)    SpO2 99%    BMI 42.64 kg/m   Wt Readings from Last 3 Encounters:  01/10/21 264 lb 3.2 oz (119.8 kg)  01/06/21 258 lb 12.8 oz (117.4 kg)  12/31/20 261 lb (118.4 kg)    Physical Exam  Constitutional: Patient appears well-developed and well-nourished. Obese No distress.  HEENT: head atraumatic, normocephalic, pupils equal and reactive to light,  neck supple Cardiovascular: Normal rate, regular rhythm and normal heart sounds.  No murmur heard. No BLE edema. Pulmonary/Chest: Effort normal and breath sounds normal. No respiratory distress. Abdominal: Soft.  There is no tenderness. Psychiatric: Patient has a normal mood and affect. behavior is normal. Judgment and thought content normal.   Results for orders placed or performed in visit on 01/06/21  POCT HgB A1C  Result Value Ref Range   Hemoglobin A1C 8.2 (A) 4.0 - 5.6 %   HbA1c  POC (<> result, manual entry)     HbA1c, POC (prediabetic range)     HbA1c, POC (controlled diabetic range)     *Note: Due to a large number of results and/or encounters for the requested time period, some results have not been displayed. A complete set of results can be found in Results Review.      Assessment & Plan:   1. Uncontrolled hypertension -follow-up with Dr. Holley Raring in next two weeks -stop hydrochlorothiazide and losartan - olmesartan-hydrochlorothiazide (BENICAR HCT) 40-25 MG tablet; Take 1 tablet by mouth daily.  Dispense: 30 tablet; Refill: 0  2. Hypertension associated with type 2 diabetes mellitus ([place]) -follow-up with Dr. Holley Raring in next two weeks -stop  hydrochlorothiazide and losartan - olmesartan-hydrochlorothiazide (BENICAR HCT) 40-25 MG tablet; Take 1 tablet by mouth daily.  Dispense: 30 tablet; Refill: 0   Follow up plan:  Close follow up with Dr. Holley Raring

## 2021-01-11 ENCOUNTER — Encounter: Payer: Self-pay | Admitting: Endocrinology

## 2021-01-14 ENCOUNTER — Telehealth: Payer: Self-pay | Admitting: Endocrinology

## 2021-01-14 DIAGNOSIS — E1165 Type 2 diabetes mellitus with hyperglycemia: Secondary | ICD-10-CM

## 2021-01-14 NOTE — Telephone Encounter (Signed)
PT called in regards to the below message sent 01/11/21. The nurse instructed her to call the office for further instructions.  My doctor change the settings on my Omnipod and now its only lasting 24 hours instead of 48 hours which is two days please I need a new subscription sent into [facility] so I can get more  insulin pomps only have to left and they will not last I cant get my next order till  shipped till January 3,2023  BS this morning was 200 PT took weekly insulin over the weekend.  Please call PT at [phone]

## 2021-01-15 MED ORDER — OMNIPOD DASH PODS (GEN 4) MISC: 0 refills | Status: DC

## 2021-01-15 NOTE — Telephone Encounter (Signed)
Per Angelina Sheriff - [facility] [address] [facility] [place]

## 2021-01-15 NOTE — Telephone Encounter (Signed)
Patient calling with regards to her Omnipod refills - she sent a MyChart message on 01/10/21, 01/11/21 and a phone message yesterday and is asking to speak to someone - Dr Dwyane Dee changed her RX and her Omnipod is being used twice as fast now and she needs an updated RX - is there anyone available to speak with her? - priority note in chart

## 2021-01-15 NOTE — Telephone Encounter (Signed)
Spoke with patient over the phone and sent in pods. Documentation is on a different encounter.

## 2021-01-15 NOTE — Telephone Encounter (Signed)
Pods sent in to pharmacy. Per patient she is using 1 pod for 1 and 1/2 days.

## 2021-01-24 ENCOUNTER — Telehealth: Payer: Self-pay

## 2021-01-24 ENCOUNTER — Ambulatory Visit: Payer: Commercial Managed Care - HMO

## 2021-01-24 NOTE — Telephone Encounter (Signed)
°  Care Management   Follow Up Note   01/24/2021 Name: Goldy Calandra MRN: [id number] DOB: Jan 31, 1965   Primary Care Provider: Delsa Grana, [name] Reason for referral : Chronic Care Management   An unsuccessful telephone outreach was attempted today. The patient was referred to the case management team for assistance with care management and care coordination.    Follow Up Plan:  A HIPAA compliant voice message was left today requesting a return call.   [name] [facility] Care Management [facility] [phone]

## 2021-01-27 MED ORDER — OMNIPOD DASH PODS (GEN 4) MISC: 2 refills | Status: DC

## 2021-01-27 NOTE — Telephone Encounter (Signed)
Patient called needing confirmation on which pharmacy new prescription was sent. Please respond via My Chart to patient. The correct pharmacy [facility]  [id number] [place] [place]  [phone] Fax:  [phone]

## 2021-01-27 NOTE — Addendum Note (Signed)
Addended by: Lauralyn Primes on: 01/27/2021 05:31 PM   Modules accepted: Orders

## 2021-01-27 NOTE — Telephone Encounter (Signed)
Rx sent to preferred pharmacy.

## 2021-01-28 MED ORDER — OMNIPOD DASH PODS (GEN 4) MISC: 2 refills | Status: DC

## 2021-01-28 NOTE — Telephone Encounter (Signed)
Patient called stating that the prescription sent to the pharmacy does not equal the quality needed. Pods are only lasting 24 hours or 1 day. Quality needed is 30 day supply. MEDICATION: Insulin Disposable Pump (OMNIPOD DASH PODS, GEN 4,) MISC  PHARMACY:   [id number] [place] [address] [address] [place]:  [phone] Fax:  [phone]     HAS THE PATIENT CONTACTED THEIR PHARMACY?  no  IS THIS A 90 DAY SUPPLY : 30 days  IS PATIENT OUT OF MEDICATION: yes  IF NOT; HOW MUCH IS LEFT:   LAST APPOINTMENT DATE: @12 /19/2022  NEXT APPOINTMENT DATE:@2 /15/2023  DO WE HAVE YOUR PERMISSION TO LEAVE A DETAILED MESSAGE?:  OTHER COMMENTS:    **Let patient know to contact pharmacy at the end of the day to make sure medication is ready. **  ** Please notify patient to allow 48-72 hours to process**  **Encourage patient to contact the pharmacy for refills or [facility]

## 2021-01-28 NOTE — Telephone Encounter (Signed)
Rx updated and sent to preferred pharmacy.

## 2021-01-28 NOTE — Telephone Encounter (Signed)
Rx was updated and sent 01/27/21 Insulin Disposable Pump (OMNIPOD DASH PODS, GEN 4,) MISC 45 each 2 01/27/2021    Sig: Change pod every 2 days   Sent to pharmacy as: Insulin Disposable Pump (OMNIPOD DASH PODS, GEN 4,) Misc   E-Prescribing Status: Receipt confirmed by pharmacy (01/27/2021  5:31 PM EST)

## 2021-01-28 NOTE — Addendum Note (Signed)
Addended by: Lauralyn Primes on: 01/28/2021 04:41 PM   Modules accepted: Orders

## 2021-01-28 NOTE — Telephone Encounter (Signed)
Patient called stating dosage is incorrect and NEW prescription is needed by pharmacy for the [place]. Omnipod should be changed every day NOT every 2 days. Please forward or update dosage and quality on this medication. Patient will be out as of 01/29/2021. Please call patient before sending new prescription at [phone].

## 2021-01-29 ENCOUNTER — Encounter: Payer: Self-pay | Admitting: Endocrinology

## 2021-01-29 DIAGNOSIS — E1165 Type 2 diabetes mellitus with hyperglycemia: Secondary | ICD-10-CM

## 2021-01-30 MED ORDER — FREESTYLE LIBRE 2 SENSOR MISC: 2 refills | Status: DC

## 2021-01-30 NOTE — Telephone Encounter (Signed)
I spoke with patient and she has gotten [facility]. Sent Rx for [place] 2 sensors today

## 2021-01-30 NOTE — Telephone Encounter (Signed)
I spoke with patient. She wants her libre 2 sensors and [facility] because its easier for her to get. Sending in Rx for [place] 2 now.

## 2021-02-01 ENCOUNTER — Other Ambulatory Visit: Payer: Self-pay | Admitting: Nurse Practitioner

## 2021-02-01 ENCOUNTER — Other Ambulatory Visit: Payer: Self-pay | Admitting: Family Medicine

## 2021-02-01 DIAGNOSIS — E785 Hyperlipidemia, unspecified: Secondary | ICD-10-CM

## 2021-02-01 DIAGNOSIS — E1159 Type 2 diabetes mellitus with other circulatory complications: Secondary | ICD-10-CM

## 2021-02-01 DIAGNOSIS — E1169 Type 2 diabetes mellitus with other specified complication: Secondary | ICD-10-CM

## 2021-02-01 DIAGNOSIS — I10 Essential (primary) hypertension: Secondary | ICD-10-CM

## 2021-02-01 NOTE — Telephone Encounter (Signed)
Requested Prescriptions  Pending Prescriptions Disp Refills   olmesartan-hydrochlorothiazide (BENICAR HCT) 40-25 MG tablet [Pharmacy Med Name: OLMESARTAN-HCTZ 40-25 MG TAB] 30 tablet 0    Sig: TAKE 1 TABLET BY MOUTH EVERY DAY     Cardiovascular: ARB + Diuretic Combos Failed - 02/01/2021  1:33 PM      Failed - Last BP in normal range    BP Readings from Last 1 Encounters:  01/10/21 (!) 174/100         Passed - K in normal range and within 180 days    Potassium  Date Value Ref Range Status  12/31/2020 3.9 3.5 - 5.1 mmol/L Final         Passed - Na in normal range and within 180 days    Sodium  Date Value Ref Range Status  12/31/2020 136 135 - 145 mmol/L Final  05/08/2015 138 134 - 144 mmol/L Final         Passed - Cr in normal range and within 180 days    Creat  Date Value Ref Range Status  07/15/2020 0.88 0.50 - 1.05 mg/dL Final    Comment:    For patients >61 years of age, the reference limit for Creatinine is approximately 13% higher for people identified as African-American. .    Creatinine, Ser  Date Value Ref Range Status  12/31/2020 0.80 0.44 - 1.00 mg/dL Final   Creatinine,U  Date Value Ref Range Status  10/03/2020 128.5 mg/dL Final   Creatinine, Urine  Date Value Ref Range Status  01/24/2018 48 20 - 275 mg/dL Final         Passed - Ca in normal range and within 180 days    Calcium  Date Value Ref Range Status  12/31/2020 9.0 8.9 - 10.3 mg/dL Final   Calcium, Ion  Date Value Ref Range Status  12/22/2015 1.22 1.15 - 1.40 mmol/L Final         Passed - Patient is not pregnant      Passed - Valid encounter within last 6 months    Recent Outpatient Visits          3 weeks ago Uncontrolled hypertension   [place], [name]   1 month ago [place], [name]   2 months ago Angina pectoris [facility])   [facility] Steele Sizer, MD   3 months ago  Vulvovaginal candidiasis   [facility] [place], Kristeen Miss, [name]   5 months ago OSA (obstructive sleep apnea)   [facility] Myles Gip, DO      Future Appointments            In 1 week Reece Packer, Myna Hidalgo, [facility], [place]   In 3 weeks  [place]

## 2021-02-02 NOTE — Telephone Encounter (Signed)
Requested Prescriptions  Pending Prescriptions Disp Refills   rosuvastatin (CRESTOR) 10 MG tablet [Pharmacy Med Name: ROSUVASTATIN CALCIUM 10 MG TAB] 90 tablet 0    Sig: TAKE 1 TABLET BY MOUTH EVERY DAY     Cardiovascular:  Antilipid - Statins Failed - 02/01/2021  3:00 PM      Failed - Triglycerides in normal range and within 360 days    Triglycerides  Date Value Ref Range Status  06/10/2020 172.0 (H) 0.0 - 149.0 mg/dL Final    Comment:    Normal:  <150 mg/dLBorderline High:  150 - 199 mg/dL         Passed - Total Cholesterol in normal range and within 360 days    Cholesterol, Total  Date Value Ref Range Status  05/08/2015 167 100 - 199 mg/dL Final   Cholesterol  Date Value Ref Range Status  06/10/2020 169 0 - 200 mg/dL Final    Comment:    ATP III Classification       Desirable:  < 200 mg/dL               Borderline High:  200 - 239 mg/dL          High:  > = 240 mg/dL         Passed - LDL in normal range and within 360 days    LDL Cholesterol (Calc)  Date Value Ref Range Status  10/12/2018 83 mg/dL (calc) Final    Comment:    Reference range: <100 . Desirable range <100 mg/dL for primary prevention;   <70 mg/dL for patients with CHD or diabetic patients  with > or = 2 CHD risk factors. [name] LDL-C is now calculated using the Martin-Hopkins  calculation, which is a validated novel method providing  better accuracy than the Friedewald equation in the  estimation of LDL-C.  Cresenciano Genre et al. Annamaria Helling. 0569;794(80): 2061-2068  (http://education.[facility].com/faq/FAQ164)    LDL Cholesterol  Date Value Ref Range Status  06/10/2020 93 0 - 99 mg/dL Final         Passed - HDL in normal range and within 360 days    HDL  Date Value Ref Range Status  06/10/2020 41.10 >39.00 mg/dL Final  05/08/2015 48 >39 mg/dL Final         Passed - Patient is not pregnant      Passed - Valid encounter within last 12 months    Recent Outpatient Visits          3 weeks ago  Uncontrolled hypertension   [place], [name]   1 month ago [place], [name]   2 months ago Angina pectoris [facility])   [facility] Steele Sizer, MD   3 months ago Vulvovaginal candidiasis   [facility] Delsa Grana, [name]   5 months ago OSA (obstructive sleep apnea)   [facility] Myles Gip, DO      Future Appointments            In 1 week Reece Packer, Myna Hidalgo, [facility], [place]   In 3 weeks  [place]

## 2021-02-03 ENCOUNTER — Other Ambulatory Visit: Payer: Self-pay

## 2021-02-03 ENCOUNTER — Ambulatory Visit
Admission: RE | Admit: 2021-02-03 | Discharge: 2021-02-03 | Disposition: A | Discharge status: Home / Self Care | Payer: Medicare Other | Source: Ambulatory Visit | Attending: Neurosurgery | Admitting: Neurosurgery

## 2021-02-03 DIAGNOSIS — M542 Cervicalgia: Secondary | ICD-10-CM | POA: Diagnosis present

## 2021-02-03 DIAGNOSIS — M5412 Radiculopathy, cervical region: Secondary | ICD-10-CM | POA: Diagnosis present

## 2021-02-03 MED ORDER — INSULIN LISPRO 100 UNIT/ML IJ SOLN: INTRAMUSCULAR | 3 refills | Status: DC

## 2021-02-03 NOTE — Addendum Note (Signed)
Addended by: Cinda Quest on: 02/03/2021 04:27 PM   Modules accepted: Orders

## 2021-02-06 ENCOUNTER — Telehealth: Payer: Self-pay

## 2021-02-06 NOTE — Telephone Encounter (Signed)
°  Care Management   Follow Up Note   02/06/2021 Name: [name] MRN: [id number] DOB: 06-Jan-1966   Primary Care Provider: Delsa Grana, [name] Reason for referral : Chronic Care Management   An unsuccessful telephone outreach was attempted today. The patient was referred to the case management team for assistance with care management and care coordination.    Follow Up Plan:  A HIPAA compliant voice message was left today requesting a return call.   [name] [facility] Care Management [facility] [phone]

## 2021-02-12 ENCOUNTER — Ambulatory Visit (INDEPENDENT_AMBULATORY_CARE_PROVIDER_SITE_OTHER): Payer: Medicare Other

## 2021-02-12 DIAGNOSIS — G894 Chronic pain syndrome: Secondary | ICD-10-CM

## 2021-02-12 DIAGNOSIS — M47812 Spondylosis without myelopathy or radiculopathy, cervical region: Secondary | ICD-10-CM | POA: Insufficient documentation

## 2021-02-12 DIAGNOSIS — E1169 Type 2 diabetes mellitus with other specified complication: Secondary | ICD-10-CM

## 2021-02-12 DIAGNOSIS — I10 Essential (primary) hypertension: Secondary | ICD-10-CM

## 2021-02-12 DIAGNOSIS — Z9181 History of falling: Secondary | ICD-10-CM

## 2021-02-12 DIAGNOSIS — M47816 Spondylosis without myelopathy or radiculopathy, lumbar region: Secondary | ICD-10-CM | POA: Insufficient documentation

## 2021-02-12 NOTE — Chronic Care Management (AMB) (Signed)
Chronic Care Management   CCM RN Visit Note  02/12/2021 Name: [name] MRN: [id number] DOB: 10-07-1965  Subjective: [name] is a 56 y.o. year old female who is a primary care patient of Delsa Grana, [place]. The care management team was consulted for assistance with disease management and care coordination needs.    Engaged with patient by telephone for follow up visit in response to provider referral for case management and care coordination services.   Consent to Services:  The patient was given information about Chronic Care Management services, agreed to services, and gave verbal consent prior to initiation of services.  Please see initial visit note for detailed documentation.    Assessment: Review of patient past medical history, allergies, medications, health status, including review of consultants reports, laboratory and other test data, was performed as part of comprehensive evaluation and provision of chronic care management services.   SDOH (Social Determinants of Health) assessments and interventions performed:  No  CCM Care Plan  Allergies  Allergen Reactions   Codeine Swelling and Other (See Comments)    Swelling and burning of mouth (inside)   Lactose Intolerance (Gi) Nausea And Vomiting    Outpatient Encounter Medications as of 02/12/2021  Medication Sig   Accu-Chek Softclix Lancets lancets 4 (four) times daily.   albuterol (ACCUNEB) 1.25 MG/3ML nebulizer solution Inhale 1 ampule into the lungs every 6 (six) hours as needed for wheezing or shortness of breath.   albuterol (PROAIR HFA) 108 (90 Base) MCG/ACT inhaler INHALE 2 PUFFS BY MOUTH EVERY 6 HOURS AS NEEDED FOR WHEEZING OR SHORTNESS OF BREATH   amitriptyline (ELAVIL) 25 MG tablet Take 2-3 tablets (50-75 mg total) by mouth at bedtime.   amLODipine (NORVASC) 10 MG tablet Take 1 tablet by mouth daily.   baclofen (LIORESAL) 10 MG tablet Take 1 tablet (10 mg total) by mouth 3 (three) times  daily as needed for muscle spasms.   budesonide (PULMICORT) 0.5 MG/2ML nebulizer solution Inhale 0.5 mg into the lungs daily.   butalbital-acetaminophen-caffeine (FIORICET) 50-325-40 MG tablet Take 1-2 tablets by mouth every 6 (six) hours as needed for headache.   carvedilol (COREG) 12.5 MG tablet Take 12.5 mg by mouth 2 (two) times daily with a meal.   cholecalciferol (VITAMIN D3) 25 MCG (1000 UNIT) tablet Take 1,000 Units by mouth daily.   cloNIDine (CATAPRES) 0.1 MG tablet Take 1 tablet (0.1 mg total) by mouth 2 (two) times daily.   clopidogrel (PLAVIX) 75 MG tablet Take 1 tablet (75 mg total) by mouth daily.   Continuous Blood Gluc Sensor (FREESTYLE LIBRE 2 SENSOR) MISC Change every 14 days   cycloSPORINE (RESTASIS) 0.05 % ophthalmic emulsion Place 2 drops into both eyes 2 (two) times daily.   Dulaglutide (TRULICITY) 4.5 NW/2.9FA SOPN Inject 4.5 mg as directed once a week.   DULoxetine (CYMBALTA) 60 MG capsule Take 1 capsule (60 mg total) by mouth daily.   EPINEPHrine 0.3 mg/0.3 mL IJ SOAJ injection Inject 0.3 mg into the muscle as needed for anaphylaxis.   ezetimibe (ZETIA) 10 MG tablet Take 1 tablet (10 mg total) by mouth daily.   FARXIGA 10 MG TABS tablet TAKE 1 TABLET BY MOUTH EVERY DAY   fluconazole (DIFLUCAN) 150 MG tablet Take 1 tablet (150 mg total) by mouth every 3 (three) days as needed (for refractory vaginal itching/yeast infection).   fluticasone (FLONASE) 50 MCG/ACT nasal spray Place 2 sprays into both nostrils daily.   gabapentin (NEURONTIN) 600 MG tablet 600 mg qAM,  600 mg qPM, 1200 mg QHS   glucose blood (ACCU-CHEK GUIDE) test strip Use as instructed to check blood sugar 4 times per day Dx code E11.9   hydrALAZINE (APRESOLINE) 25 MG tablet Take 1 tablet (25 mg total) by mouth every 8 (eight) hours.   Insulin Disposable Pump (OMNIPOD DASH PODS, GEN 4,) MISC Change pod once daily   insulin lispro (HUMALOG) 100 UNIT/ML injection USE UP TO 150 UNITS/DAY IN INSULIN PUMP    levETIRAcetam (KEPPRA) 500 MG tablet Take 500-750 mg by mouth See admin instructions. Take 1 tablet (570m) by mouth every morning and take 1 tablets (7533m by mouth every night   mirabegron ER (MYRBETRIQ) 50 MG TB24 tablet Take 1 tablet (50 mg total) by mouth daily.   nitrofurantoin (MACRODANTIN) 50 MG capsule Take 50 mg by mouth at bedtime.   nitroGLYCERIN (NITROSTAT) 0.4 MG SL tablet Place 0.4 mg under the tongue every 5 (five) minutes as needed for chest pain.   olmesartan-hydrochlorothiazide (BENICAR HCT) 40-25 MG tablet TAKE 1 TABLET BY MOUTH EVERY DAY   pantoprazole (PROTONIX) 20 MG tablet Take 1 tablet (20 mg total) by mouth daily.   polyethylene glycol (MIRALAX / GLYCOLAX) 17 g packet Take 17 g by mouth daily as needed for mild constipation.   potassium chloride SA (KLOR-CON M20) 20 MEQ tablet You have not needed potassium while in the hospital.  Hold until outpatient followup. (Patient taking differently: Take 20 mEq by mouth 2 (two) times daily.)   rosuvastatin (CRESTOR) 10 MG tablet TAKE 1 TABLET BY MOUTH EVERY DAY   spironolactone (ALDACTONE) 25 MG tablet Take 25 mg by mouth daily.   WIXELA INHUB 250-50 MCG/DOSE AEPB Inhale 1 puff into the lungs 2 (two) times daily.   No facility-administered encounter medications on file as of 02/12/2021.    Patient Active Problem List   Diagnosis Date Noted   Recurrent UTI 11/13/2020   Angina pectoris (HCS[place]yler10/26/2022   Major depressive disorder, recurrent episode, moderate (HCBeling[place]10/26/2022   Weakness of right arm    Adrenal adenoma, right    PAD (peripheral artery disease) (HCChical01/05/2020   Abnormal ankle brachial index (ABI) 12/18/2019   CAD (coronary artery disease) 04/27/2019   Myalgia 04/19/2019   Diabetic polyneuropathy associated with type 2 diabetes mellitus (HCMonroe02/12/2018   Vitamin D deficiency 01/24/2018   Overactive bladder 05/18/2017   Dilated aortic root (HCC)    Sacroiliac dysfunction 10/09/2014   Chronic low back pain  09/03/2014   CVA, old, hemiparesis (HCMillington08/15/2016   OSA (obstructive sleep apnea) 05/15/2014   Type 2 diabetes mellitus with hyperlipidemia (HCKimmswick01/03/2014   Chronic pain syndrome 01/21/2014   Headache disorder 09/07/2013   Mixed urge and stress incontinence 08/25/2011   Ureteral stricture, right 08/25/2011   Pyelonephritis 08/22/2011   Nephrolithiasis 08/22/2011   Incisional hernia 05/10/2011   Class 3 severe obesity with serious comorbidity and body mass index (BMI) of 40.0 to 44.9 in adult (HCNaples04/21/2013   BLINDNESS, LEGAL, [place] 09/14/2006   Hyperlipemia 09/13/2006   Accelerated hypertension 09/13/2006   GERD 09/13/2006   Seizure disorder (HCBethany08/25/2008    Patient Care Plan: RN Care Management Plan of Care     Problem Identified: DM, HTN, HLD, COPD and Fall Risk      Long-Range Goal: Disease Progression Minimized or Managed   Start Date: 11/29/2020  Expected End Date: 02/27/2021  Priority: High  Note:   Current Barriers:  Chronic Disease Management support and education needs related  to HTN, HLD, COPD, DMII, and Fall Risk  RNCM Clinical Goal(s):  Patient will continue to work with RN Care Manager to address care management and care coordination needs related to  HTN, HLD, COPD, DMII, and Fall Risk.  Interventions: 1:1 collaboration with primary care provider regarding development and update of comprehensive plan of care as evidenced by provider attestation and co-signature Inter-disciplinary care team collaboration (see longitudinal plan of care) Evaluation of current treatment plan related to  self management and patient's adherence to plan as established by provider  Hypertension Interventions: Reviewed plan for hypertension management. Reports adjusting and taking medications as advised by the Cardiology team. Reviewed BP readings. Continues to note fluctuations with readings. Reports BP was within established range today. Advised to monitor  consistently and record readings. Reviewed indications for notifying a provider.  Reviewed complications of uncontrolled blood pressure.  Reviewed s/sx of heart attack, stroke and worsening symptoms that require immediate medical attention.   Diabetes Interventions: Assessed patient's understanding of A1c goal: <7% Lab Results  Component Value Date   HGBA1C 8.2 (A) 01/06/2021  Reviewed plan for diabetes management. Reports taking medications as prescribed and managing well. Reviewed recent blood glucose readings. Reports improvements with readings. Recalls experiencing one episode of hypoglycemia when level dropped to 82 mg/dl. Reports level increased 139 mg/dl after eating a snack. Advised to continue closely monitoring readings on her Dexcom.  Reviewed appointments. Advised to continue attending follow-ups with the Endocrinology team as scheduled. Her A1C has improved. Currently at 8.2%. Reports being motivated to improve her overall diabetes management.   Fall Risk Interventions: Reviewed safety and fall prevention measures. Reports following recommended safety measures. Continues to use a cane or rollator walker. Discussed changes in mobility due to chronic pain. Denies recent falls but ambulation remains challenging due to pain. Reports being evaluated by the Rheumatologist earlier today and will follow up with the pain management team to discuss options for oral pain medications. Reports receiving injections in the past with little relief.  Discussed plan for physical therapy.  Anticipates starting Physical Therapy with [facility] next week. Reviewed ability to perform ADL's.  Reports activity tolerance has declined d/t pain, but she continues to complete ADL's independently. Reports her daughter remains available to assist as needed. Agreed to keep the care management team updated of functional status.   Patient Goals/Self-Care Activities: Patient will self administer medications  as prescribed Patient will attend all scheduled provider appointments Patient will call pharmacy for medication refills Patient will continue to perform ADL's independently Patient will call provider office for new concerns or questions    Follow Up Plan:   Will follow up next month      PLAN: A member of the care management team will follow up next month.   [name] Care Management [facility] [phone]

## 2021-02-12 NOTE — Patient Instructions (Signed)
Thank you for allowing the Chronic Care Management team to participate in your care. It was great speaking with you today!

## 2021-02-14 ENCOUNTER — Encounter: Payer: Self-pay | Admitting: Nurse Practitioner

## 2021-02-14 ENCOUNTER — Ambulatory Visit (INDEPENDENT_AMBULATORY_CARE_PROVIDER_SITE_OTHER): Payer: Medicare Other | Admitting: Nurse Practitioner

## 2021-02-14 ENCOUNTER — Ambulatory Visit: Payer: Self-pay

## 2021-02-14 ENCOUNTER — Other Ambulatory Visit: Payer: Self-pay

## 2021-02-14 ENCOUNTER — Telehealth: Payer: Self-pay

## 2021-02-14 VITALS — BP 130/80 | HR 97 | Temp 98.4°F | Resp 16 | Ht 66.0 in | Wt 262.8 lb

## 2021-02-14 DIAGNOSIS — I77810 Thoracic aortic ectasia: Secondary | ICD-10-CM | POA: Diagnosis not present

## 2021-02-14 DIAGNOSIS — Z9181 History of falling: Secondary | ICD-10-CM

## 2021-02-14 DIAGNOSIS — E785 Hyperlipidemia, unspecified: Secondary | ICD-10-CM

## 2021-02-14 DIAGNOSIS — E1169 Type 2 diabetes mellitus with other specified complication: Secondary | ICD-10-CM | POA: Diagnosis not present

## 2021-02-14 DIAGNOSIS — E1159 Type 2 diabetes mellitus with other circulatory complications: Secondary | ICD-10-CM | POA: Diagnosis not present

## 2021-02-14 DIAGNOSIS — I209 Angina pectoris, unspecified: Secondary | ICD-10-CM

## 2021-02-14 DIAGNOSIS — F330 Major depressive disorder, recurrent, mild: Secondary | ICD-10-CM

## 2021-02-14 DIAGNOSIS — K219 Gastro-esophageal reflux disease without esophagitis: Secondary | ICD-10-CM

## 2021-02-14 DIAGNOSIS — I152 Hypertension secondary to endocrine disorders: Secondary | ICD-10-CM

## 2021-02-14 DIAGNOSIS — E1142 Type 2 diabetes mellitus with diabetic polyneuropathy: Secondary | ICD-10-CM

## 2021-02-14 MED ORDER — EZETIMIBE 10 MG PO TABS: 10.0000 mg | ORAL_TABLET | Freq: Every day | ORAL | 0 refills | Status: DC

## 2021-02-14 MED ORDER — ROSUVASTATIN CALCIUM 10 MG PO TABS: 10.0000 mg | ORAL_TABLET | Freq: Every day | ORAL | 1 refills | Status: DC

## 2021-02-14 MED ORDER — PANTOPRAZOLE SODIUM 20 MG PO TBEC: 20.0000 mg | DELAYED_RELEASE_TABLET | Freq: Every day | ORAL | 1 refills | Status: DC

## 2021-02-14 MED ORDER — DULOXETINE HCL 60 MG PO CPEP: 60.0000 mg | ORAL_CAPSULE | Freq: Every day | ORAL | 1 refills | Status: DC

## 2021-02-14 NOTE — Telephone Encounter (Signed)
Pt requesting a return call she has a question to ask you about a book that will help her keep track of different things.

## 2021-02-14 NOTE — Progress Notes (Addendum)
BP 130/80    Pulse 97    Temp 98.4 F (36.9 C) (Oral)    Resp 16    Ht 5' 6"  (1.676 m)    Wt 262 lb 12.8 oz (119.2 kg)    LMP  (LMP Unknown)    SpO2 98%    BMI 42.42 kg/m    Subjective:    Patient ID: [name], female    DOB: 1965/08/10, 56 y.o.   MRN: [id number]  HPI: [name] is a 56 y.o. female  Chief Complaint  Patient presents with   Hypertension   Hyperlipidemia   Diabetes    3 month follow up    Aorta dilation/angina/HTN: She was last seen at Dr. Etta Quill office on 01/31/2021. They have been prescribing her blood pressure medications. Her blood pressure today is 130/80 which is an improvement from last visit which was 182/102.  She says her cardiologist is changing up her medication to find the right combination.    Cardiac cath done 2021: LV EDP is normal. Wedge pressures of A-wave 13 V wave of 11 mean of 10 PA pressures of 30 systolic 50 and diastolic mean of 22 RV systolic of 34 diastolic of 6 end-diastolic of 12 RA shows an A-wave of 11 V wave of 5 mean of 6 LV systolic of 536 diastolic of 4 end-diastolic of 10 Ao stock of 144 diastolic of 92 mean of 315 PA sat was 77% Aortic sat was 96% Fick cardiac output was 6.7 PVR of 143 SVR 1379  Echo from 2017 : Normal LVF with mild LVH and increased stiffness of heart muscle.  There is mild leakiness of AV and mildly dilated aorta - repeat limited echo in 1 year for dilated aortic root   Hyperlipidemia:  She is currently taking zetia 10 mg and rosuvastatin 10 mg daily.  Her last LDL was 93.  Will get labs at next visit.   DMII: She is currently under the care of endocrinology for her diabetes.   She was seen by Dr. Dwyane Dee on 01/06/2021.  The plan for her diabetes includes doing mealtime boluses, using a 1:10 carbohydrate ratio, She is starting back on her freestyle libre.  Continuing Trulicity and [place].  Her last A1C was 8.2 on 01/06/21.  She denies any polyuria, polydipsia or polyphagia.  She has  not had any hypoglycemic episodes.   Depression:  She is currently taking cymbalta 60 mg daily. She says she is doing well. Will continue current dose.     Depression screen [facility] 2/9 02/14/2021 02/14/2021 01/10/2021 12/10/2020 11/26/2020  Decreased Interest 0 0 0 0 0  Down, Depressed, Hopeless 0 0 0 0 0  PHQ - 2 Score 0 0 0 0 0  Altered sleeping 0 - 0 0 -  Tired, decreased energy 0 - 0 0 -  Change in appetite 0 - 0 0 -  Feeling bad or failure about yourself  0 - 0 0 -  Trouble concentrating 0 - 0 0 -  Moving slowly or fidgety/restless 0 - 0 0 -  Suicidal thoughts 0 - 0 0 -  PHQ-9 Score 0 - 0 0 -  Difficult doing work/chores Not difficult at all - Not difficult at all Not difficult at all -  Some recent data might be hidden      GERD: She says she is currently taking protonix for acid reflux.  She says she is doing well.    Morbid obesity: Her weight is 262  lbs with a BMI of 42.42.  She has trouble moving around because of her chronic back pain.  She is going to start PT 02/25/21.    Relevant past medical, surgical, family and social history reviewed and updated as indicated. Interim medical history since our last visit reviewed. Allergies and medications reviewed and updated.  Review of Systems  Constitutional: Negative for fever or weight change.  Respiratory: Negative for cough and shortness of breath.   Cardiovascular: Negative for chest pain or palpitations.  Gastrointestinal: Negative for abdominal pain, no bowel changes.  Musculoskeletal: positive for gait problem (uses cane) , positive for back pain, negative for joint swelling.  Skin: Negative for rash.  Neurological: Negative for dizziness or headache.  No other specific complaints in a complete review of systems (except as listed in HPI above).      Objective:    BP 130/80    Pulse 97    Temp 98.4 F (36.9 C) (Oral)    Resp 16    Ht 5' 6"  (1.676 m)    Wt 262 lb 12.8 oz (119.2 kg)    LMP  (LMP Unknown)    SpO2 98%    BMI  42.42 kg/m   Wt Readings from Last 3 Encounters:  02/14/21 262 lb 12.8 oz (119.2 kg)  01/10/21 264 lb 3.2 oz (119.8 kg)  01/06/21 258 lb 12.8 oz (117.4 kg)    Physical Exam  Constitutional: Patient appears well-developed and well-nourished. Obese  No distress.  HEENT: head atraumatic, normocephalic, pupils equal and reactive to light, neck supple Cardiovascular: Normal rate, regular rhythm and normal heart sounds.  No murmur heard. No BLE edema. Pulmonary/Chest: Effort normal and breath sounds normal. No respiratory distress. Abdominal: Soft.  There is no tenderness. Psychiatric: Patient has a normal mood and affect. behavior is normal. Judgment and thought content normal.   Results for orders placed or performed in visit on 01/06/21  POCT HgB A1C  Result Value Ref Range   Hemoglobin A1C 8.2 (A) 4.0 - 5.6 %   HbA1c POC (<> result, manual entry)     HbA1c, POC (prediabetic range)     HbA1c, POC (controlled diabetic range)     *Note: Due to a large number of results and/or encounters for the requested time period, some results have not been displayed. A complete set of results can be found in Results Review.      Assessment & Plan:   1. Dilated aortic root (HCC)  -continue to follow-up with Dr. Clayborn Bigness 2. Hypertension associated with type 2 diabetes mellitus ([place])  -continue to follow treatment plan 3. Angina pectoris ([place])  -continue to follow-up with Dr. Clayborn Bigness 4. Dyslipidemia associated with type 2 diabetes mellitus (HCC)  - rosuvastatin (CRESTOR) 10 MG tablet; Take 1 tablet (10 mg total) by mouth daily.  Dispense: 90 tablet; Refill: 1 - ezetimibe (ZETIA) 10 MG tablet; Take 1 tablet (10 mg total) by mouth daily.  Dispense: 90 tablet; Refill: 0  5. Gastroesophageal reflux disease without esophagitis  - pantoprazole (PROTONIX) 20 MG tablet; Take 1 tablet (20 mg total) by mouth daily.  Dispense: 90 tablet; Refill: 1  6. Mild episode of recurrent major depressive  disorder (HCC)  - DULoxetine (CYMBALTA) 60 MG capsule; Take 1 capsule (60 mg total) by mouth daily.  Dispense: 90 capsule; Refill: 1  7. Diabetic polyneuropathy associated with type 2 diabetes mellitus (HCC)  - DULoxetine (CYMBALTA) 60 MG capsule; Take 1 capsule (60 mg total) by mouth daily.  Dispense: 90 capsule; Refill: 1   Follow up plan: Return in about 6 months (around 08/14/2021) for follow up.

## 2021-02-18 DIAGNOSIS — Z87891 Personal history of nicotine dependence: Secondary | ICD-10-CM | POA: Diagnosis not present

## 2021-02-18 DIAGNOSIS — E785 Hyperlipidemia, unspecified: Secondary | ICD-10-CM | POA: Diagnosis not present

## 2021-02-18 DIAGNOSIS — I10 Essential (primary) hypertension: Secondary | ICD-10-CM

## 2021-02-18 DIAGNOSIS — E1169 Type 2 diabetes mellitus with other specified complication: Secondary | ICD-10-CM | POA: Diagnosis not present

## 2021-02-18 DIAGNOSIS — Z794 Long term (current) use of insulin: Secondary | ICD-10-CM

## 2021-02-21 ENCOUNTER — Other Ambulatory Visit: Payer: Self-pay | Admitting: Family Medicine

## 2021-02-21 DIAGNOSIS — I10 Essential (primary) hypertension: Secondary | ICD-10-CM

## 2021-02-21 DIAGNOSIS — I152 Hypertension secondary to endocrine disorders: Secondary | ICD-10-CM

## 2021-02-21 DIAGNOSIS — E1159 Type 2 diabetes mellitus with other circulatory complications: Secondary | ICD-10-CM

## 2021-02-21 NOTE — Telephone Encounter (Signed)
Requested Prescriptions  Pending Prescriptions Disp Refills   olmesartan-hydrochlorothiazide (BENICAR HCT) 40-25 MG tablet [Pharmacy Med Name: OLMESARTAN-HCTZ 40-25 MG TAB] 30 tablet 0    Sig: TAKE 1 TABLET BY MOUTH EVERY DAY     Cardiovascular: ARB + Diuretic Combos Passed - 02/21/2021  1:48 AM      Passed - K in normal range and within 180 days    Potassium  Date Value Ref Range Status  12/31/2020 3.9 3.5 - 5.1 mmol/L Final         Passed - Na in normal range and within 180 days    Sodium  Date Value Ref Range Status  12/31/2020 136 135 - 145 mmol/L Final  05/08/2015 138 134 - 144 mmol/L Final         Passed - Cr in normal range and within 180 days    Creat  Date Value Ref Range Status  07/15/2020 0.88 0.50 - 1.05 mg/dL Final    Comment:    For patients >20 years of age, the reference limit for Creatinine is approximately 13% higher for people identified as African-American. .    Creatinine, Ser  Date Value Ref Range Status  12/31/2020 0.80 0.44 - 1.00 mg/dL Final   Creatinine,U  Date Value Ref Range Status  10/03/2020 128.5 mg/dL Final   Creatinine, Urine  Date Value Ref Range Status  01/24/2018 48 20 - 275 mg/dL Final         Passed - eGFR is 10 or above and within 180 days    GFR, Est African American  Date Value Ref Range Status  11/28/2018 94 > OR = 60 mL/min/1.17m Final   GFR calc Af Amer  Date Value Ref Range Status  05/30/2019 >60 >60 mL/min Final   GFR, Est Non African American  Date Value Ref Range Status  11/28/2018 81 > OR = 60 mL/min/1.751mFinal   GFR, Estimated  Date Value Ref Range Status  12/31/2020 >60 >60 mL/min Final    Comment:    (NOTE) Calculated using the CKD-EPI Creatinine Equation (2021)    GFR  Date Value Ref Range Status  09/30/2020 76.80 >60.00 mL/min Final    Comment:    Calculated using the CKD-EPI Creatinine Equation (2021)         Passed - Patient is not pregnant      Passed - Last BP in normal range    BP  Readings from Last 1 Encounters:  02/14/21 130/80         Passed - Valid encounter within last 6 months    Recent Outpatient Visits          [facility] week [facility]   1 month ago Uncontrolled hypertension   [facility]   2 months ago [place]   3 months ago [facility]  [facility] SizerMD   4 months ago Vulvovaginal candidiasis   [facility]      Future Appointments            In 6 days  [facility] In [facility] PeReece PackerJuMyna HidalgoFN[facility]

## 2021-02-24 NOTE — Chronic Care Management (AMB) (Signed)
Chronic Care Management   CCM RN Visit Note   Name: [name] MRN: [id number] DOB: [birth year]  Subjective: [name] is a 56 y.o. year old female who is a primary care patient of Delsa Grana, [place]. The care management team was consulted for assistance with disease management and care coordination needs.    Engaged with patient by telephone for follow up visit in response to provider referral for case management and care coordination services.   Consent to Services:  The patient was given information about Chronic Care Management services, agreed to services, and gave verbal consent prior to initiation of services.  Please see initial visit note for detailed documentation.    Assessment: Review of patient past medical history, allergies, medications, health status, including review of consultants reports, laboratory and other test data, was performed as part of comprehensive evaluation and provision of chronic care management services.   SDOH (Social Determinants of Health) assessments and interventions performed: No  CCM Care Plan  Allergies  Allergen Reactions   Codeine Swelling and Other (See Comments)    Swelling and burning of mouth (inside)   Lactose Intolerance (Gi) Nausea And Vomiting    Outpatient Encounter Medications as of 02/14/2021  Medication Sig   albuterol (ACCUNEB) 1.25 MG/3ML nebulizer solution Inhale 1 ampule into the lungs every 6 (six) hours as needed for wheezing or shortness of breath.   albuterol (PROAIR HFA) 108 (90 Base) MCG/ACT inhaler INHALE 2 PUFFS BY MOUTH EVERY 6 HOURS AS NEEDED FOR WHEEZING OR SHORTNESS OF BREATH   amitriptyline (ELAVIL) 25 MG tablet Take 2-3 tablets (50-75 mg total) by mouth at bedtime.   amLODipine (NORVASC) 10 MG tablet Take 1 tablet by mouth daily.   baclofen (LIORESAL) 10 MG tablet Take 1 tablet (10 mg total) by mouth 3 (three) times daily as needed for muscle spasms.   budesonide (PULMICORT) 0.5 MG/2ML  nebulizer solution Inhale 0.5 mg into the lungs daily.   butalbital-acetaminophen-caffeine (FIORICET) 50-325-40 MG tablet Take 1-2 tablets by mouth every 6 (six) hours as needed for headache.   carvedilol (COREG) 12.5 MG tablet Take 12.5 mg by mouth 2 (two) times daily with a meal.   cholecalciferol (VITAMIN D3) 25 MCG (1000 UNIT) tablet Take 1,000 Units by mouth daily.   ciclopirox (PENLAC) 8 % solution Apply topically at bedtime.   cloNIDine (CATAPRES) 0.1 MG tablet Take 1 tablet (0.1 mg total) by mouth 2 (two) times daily.   cloNIDine (CATAPRES) 0.1 MG tablet Take by mouth.   clopidogrel (PLAVIX) 75 MG tablet Take 1 tablet (75 mg total) by mouth daily.   Continuous Blood Gluc Sensor (FREESTYLE LIBRE 2 SENSOR) MISC Change every 14 days   cycloSPORINE (RESTASIS) 0.05 % ophthalmic emulsion Place 2 drops into both eyes 2 (two) times daily.   Dulaglutide (TRULICITY) 4.5 OJ/5.0KX SOPN Inject 4.5 mg as directed once a week.   DULoxetine (CYMBALTA) 60 MG capsule Take 1 capsule (60 mg total) by mouth daily.   EPINEPHrine 0.3 mg/0.3 mL IJ SOAJ injection Inject 0.3 mg into the muscle as needed for anaphylaxis.   ezetimibe (ZETIA) 10 MG tablet Take 1 tablet (10 mg total) by mouth daily.   FARXIGA 10 MG TABS tablet TAKE 1 TABLET BY MOUTH EVERY DAY   fluticasone (FLONASE) 50 MCG/ACT nasal spray Place 2 sprays into both nostrils daily.   gabapentin (NEURONTIN) 600 MG tablet 600 mg qAM, 600 mg qPM, 1200 mg QHS   glucose blood (ACCU-CHEK GUIDE) test strip Use as  instructed to check blood sugar 4 times per day Dx code E11.9   hydrALAZINE (APRESOLINE) 25 MG tablet Take 1 tablet (25 mg total) by mouth every 8 (eight) hours.   hydrALAZINE (APRESOLINE) 50 MG tablet Take 1 tablet by mouth 3 (three) times daily.   hydrochlorothiazide (HYDRODIURIL) 25 MG tablet Take 25 mg by mouth daily.   Insulin Disposable Pump (OMNIPOD DASH PODS, GEN 4,) MISC Change pod once daily   insulin lispro (HUMALOG) 100 UNIT/ML injection  USE UP TO 150 UNITS/DAY IN INSULIN PUMP   levETIRAcetam (KEPPRA) 500 MG tablet Take 500-750 mg by mouth See admin instructions. Take 1 tablet (575m) by mouth every morning and take 1 tablets (7517m by mouth every night   mirabegron ER (MYRBETRIQ) 50 MG TB24 tablet Take 1 tablet (50 mg total) by mouth daily.   nitroGLYCERIN (NITROSTAT) 0.4 MG SL tablet Place 0.4 mg under the tongue every 5 (five) minutes as needed for chest pain.   pantoprazole (PROTONIX) 20 MG tablet Take 1 tablet (20 mg total) by mouth daily.   polyethylene glycol (MIRALAX / GLYCOLAX) 17 g packet Take 17 g by mouth daily as needed for mild constipation.   potassium chloride SA (KLOR-CON M20) 20 MEQ tablet You have not needed potassium while in the hospital.  Hold until outpatient followup. (Patient taking differently: Take 20 mEq by mouth 2 (two) times daily.)   rosuvastatin (CRESTOR) 10 MG tablet Take 1 tablet (10 mg total) by mouth daily.   spironolactone (ALDACTONE) 25 MG tablet Take 25 mg by mouth daily.   terconazole (TERAZOL 7) 0.4 % vaginal cream Place vaginally.   WIXELA INHUB 250-50 MCG/DOSE AEPB Inhale 1 puff into the lungs 2 (two) times daily.   [DISCONTINUED] olmesartan-hydrochlorothiazide (BENICAR HCT) 40-25 MG tablet TAKE 1 TABLET BY MOUTH EVERY DAY   No facility-administered encounter medications on file as of 02/14/2021.    Patient Active Problem List   Diagnosis Date Noted   Cervical spondylosis 02/12/2021   Lumbar facet arthropathy 02/12/2021   Recurrent UTI 11/13/2020   Angina pectoris (HC[place]10/26/2022   Major depressive disorder, recurrent episode, moderate (HCC) 11/13/2020   Weakness of right arm    Adrenal adenoma, right    PAD (peripheral artery disease) (HCAur[place]01/05/2020   Abnormal ankle brachial index (ABI) 12/18/2019   CAD (coronary artery disease) 04/27/2019   Myalgia 04/19/2019   Diabetic polyneuropathy associated with type 2 diabetes mellitus (HCAn[place]02/12/2018   Vitamin D deficiency  01/24/2018   Overactive bladder 05/18/2017   Dilated aortic root (HCC)    Sacroiliac dysfunction 10/09/2014   Chronic low back pain 09/03/2014   CVA, old, hemiparesis (HCS[place]08/15/2016   OSA (obstructive sleep apnea) 05/15/2014   Type 2 diabetes mellitus with hyperlipidemia (HCBe[place]01/03/2014   Chronic pain syndrome 01/21/2014   Headache disorder 09/07/2013   Mixed urge and stress incontinence 08/25/2011   Ureteral stricture, right 08/25/2011   Pyelonephritis 08/22/2011   Nephrolithiasis 08/22/2011   Incisional hernia 05/10/2011   Class 3 severe obesity with serious comorbidity and body mass index (BMI) of 40.0 to 44.9 in adult (HCJones [place]04/21/2013   BLINDNESS, LE[place], [place] 09/14/2006   Hyperlipemia 09/13/2006   Accelerated hypertension 09/13/2006   GERD 09/13/2006   Seizure disorder (HCLin[place]08/25/2008      Care Plan : RN Care Management Plan of Care       Problem: DM, HTN, HLD, COPD and Fall Risk      Long-Range Goal: Disease Progression Minimized or Managed  Start Date: 11/29/2020  Expected End Date: 02/27/2021  Priority: High  Note:   Current Barriers:  Chronic Disease Management support and education needs related to HTN, HLD, COPD, DMII, and Fall Risk  RNCM Clinical Goal(s):  Patient will continue to work with RN Care Manager to address care management and care coordination needs related to  HTN, HLD, COPD, DMII, and Fall Risk.  Interventions: 1:1 collaboration with primary care provider regarding development and update of comprehensive plan of care as evidenced by provider attestation and co-signature Inter-disciplinary care team collaboration (see longitudinal plan of care) Evaluation of current treatment plan related to  self management and patient's adherence to plan as established by provider  Hypertension Interventions: Reviewed plan for hypertension management. Reports adjusting and taking medications as advised by the Cardiology team. Reviewed BP  readings. Continues to note fluctuations with readings. Reports BP was within established range today. Advised to monitor consistently and record readings. Reviewed indications for notifying a provider.  Reviewed complications of uncontrolled blood pressure.  Reviewed s/sx of heart attack, stroke and worsening symptoms that require immediate medical attention.   Diabetes Interventions: Assessed patient's understanding of A1c goal: <7% Lab Results  Component Value Date   HGBA1C 8.2 (A) 01/06/2021  Reviewed plan for diabetes management. Reports taking medications as prescribed and managing well. Reviewed recent blood glucose readings. Reports improvements with readings. Recalls experiencing one episode of hypoglycemia when level dropped to 82 mg/dl. Reports level increased 139 mg/dl after eating a snack. Advised to continue closely monitoring readings on her Dexcom.  Reviewed appointments. Advised to continue attending follow-ups with the Endocrinology team as scheduled. Her A1C has improved. Currently at 8.2%. Reports being motivated to improve her overall diabetes management.   Fall Risk Interventions: Reviewed safety and fall prevention measures. Reports following recommended safety measures. Continues to use a cane or rollator walker. Discussed changes in mobility due to chronic pain. Denies recent falls but ambulation remains challenging due to pain. Reports being evaluated by the Rheumatologist earlier today and will follow up with the pain management team to discuss options for oral pain medications. Reports receiving injections in the past with little relief.  Discussed plan for physical therapy.  Anticipates starting Physical Therapy with [facility] next week. Reviewed ability to perform ADL's.  Reports activity tolerance has declined d/t pain, but she continues to complete ADL's independently. Reports her daughter remains available to assist as needed. Agreed to keep the care  management team updated of functional status. Update on 02/14/21: Patient contacted clinic to request a booklet/Calendar. Will follow up to confirm patient receives the [facility] Management Calendar.   Patient Goals/Self-Care Activities: Patient will self administer medications as prescribed Patient will attend all scheduled provider appointments Patient will call pharmacy for medication refills Patient will continue to perform ADL's independently Patient will call provider office for new concerns or questions    Follow Up Plan:   Will follow up within the next month     PLAN A member of the care management team will follow up within the next month.    [name] Health/THN Care Management [facility] [phone]

## 2021-02-27 ENCOUNTER — Ambulatory Visit: Payer: Medicare Other

## 2021-02-27 ENCOUNTER — Ambulatory Visit (INDEPENDENT_AMBULATORY_CARE_PROVIDER_SITE_OTHER): Payer: Medicare Other

## 2021-02-27 DIAGNOSIS — Z9181 History of falling: Secondary | ICD-10-CM

## 2021-02-27 DIAGNOSIS — E1169 Type 2 diabetes mellitus with other specified complication: Secondary | ICD-10-CM

## 2021-02-27 DIAGNOSIS — I10 Essential (primary) hypertension: Secondary | ICD-10-CM

## 2021-02-27 NOTE — Chronic Care Management (AMB) (Signed)
Chronic Care Management   CCM RN Visit Note  02/27/2021 Name: [name] MRN: [id number] DOB: 11-01-1965  Subjective: [name] is a 56 y.o. year old female who is a primary care patient of Delsa Grana, [place]. The care management team was consulted for assistance with disease management and care coordination needs.    Engaged with patient by telephone for follow up visit in response to provider referral for case management and care coordination services.   Consent to Services:  The patient was given information about Chronic Care Management services, agreed to services, and gave verbal consent prior to initiation of services.  Please see initial visit note for detailed documentation.   Assessment: Review of patient past medical history, allergies, medications, health status, including review of consultants reports, laboratory and other test data, was performed as part of comprehensive evaluation and provision of chronic care management services.   SDOH (Social Determinants of Health) assessments and interventions performed: No  CCM Care Plan  Allergies  Allergen Reactions   Codeine Swelling and Other (See Comments)    Swelling and burning of mouth (inside)   Lactose Intolerance (Gi) Nausea And Vomiting    Outpatient Encounter Medications as of 02/27/2021  Medication Sig   albuterol (ACCUNEB) 1.25 MG/3ML nebulizer solution Inhale 1 ampule into the lungs every 6 (six) hours as needed for wheezing or shortness of breath.   albuterol (PROAIR HFA) 108 (90 Base) MCG/ACT inhaler INHALE 2 PUFFS BY MOUTH EVERY 6 HOURS AS NEEDED FOR WHEEZING OR SHORTNESS OF BREATH   amitriptyline (ELAVIL) 25 MG tablet Take 2-3 tablets (50-75 mg total) by mouth at bedtime.   amLODipine (NORVASC) 10 MG tablet Take 1 tablet by mouth daily.   baclofen (LIORESAL) 10 MG tablet Take 1 tablet (10 mg total) by mouth 3 (three) times daily as needed for muscle spasms.   budesonide (PULMICORT) 0.5  MG/2ML nebulizer solution Inhale 0.5 mg into the lungs daily.   butalbital-acetaminophen-caffeine (FIORICET) 50-325-40 MG tablet Take 1-2 tablets by mouth every 6 (six) hours as needed for headache.   carvedilol (COREG) 12.5 MG tablet Take 12.5 mg by mouth 2 (two) times daily with a meal.   cholecalciferol (VITAMIN D3) 25 MCG (1000 UNIT) tablet Take 1,000 Units by mouth daily.   ciclopirox (PENLAC) 8 % solution Apply topically at bedtime.   cloNIDine (CATAPRES) 0.1 MG tablet Take 1 tablet (0.1 mg total) by mouth 2 (two) times daily.   cloNIDine (CATAPRES) 0.1 MG tablet Take by mouth.   clopidogrel (PLAVIX) 75 MG tablet Take 1 tablet (75 mg total) by mouth daily.   Continuous Blood Gluc Sensor (FREESTYLE LIBRE 2 SENSOR) MISC Change every 14 days   cycloSPORINE (RESTASIS) 0.05 % ophthalmic emulsion Place 2 drops into both eyes 2 (two) times daily.   Dulaglutide (TRULICITY) 4.5 [place]/7.0WC SOPN Inject 4.5 mg as directed once a week.   DULoxetine (CYMBALTA) 60 MG capsule Take 1 capsule (60 mg total) by mouth daily.   EPINEPHrine 0.3 mg/0.3 mL IJ SOAJ injection Inject 0.3 mg into the muscle as needed for anaphylaxis.   ezetimibe (ZETIA) 10 MG tablet Take 1 tablet (10 mg total) by mouth daily.   FARXIGA 10 MG TABS tablet TAKE 1 TABLET BY MOUTH EVERY DAY   fluticasone (FLONASE) 50 MCG/ACT nasal spray Place 2 sprays into both nostrils daily.   gabapentin (NEURONTIN) 600 MG tablet 600 mg qAM, 600 mg qPM, 1200 mg QHS   glucose blood (ACCU-CHEK GUIDE) test strip Use as instructed  to check blood sugar 4 times per day Dx code E11.9   hydrALAZINE (APRESOLINE) 25 MG tablet Take 1 tablet (25 mg total) by mouth every 8 (eight) hours.   hydrALAZINE (APRESOLINE) 50 MG tablet Take 1 tablet by mouth 3 (three) times daily.   hydrochlorothiazide (HYDRODIURIL) 25 MG tablet Take 25 mg by mouth daily.   Insulin Disposable Pump (OMNIPOD DASH PODS, GEN 4,) MISC Change pod once daily   insulin lispro (HUMALOG) 100 UNIT/ML  injection USE UP TO 150 UNITS/DAY IN INSULIN PUMP   levETIRAcetam (KEPPRA) 500 MG tablet Take 500-750 mg by mouth See admin instructions. Take 1 tablet (540m) by mouth every morning and take 1 tablets (7552m by mouth every night   mirabegron ER (MYRBETRIQ) 50 MG TB24 tablet Take 1 tablet (50 mg total) by mouth daily.   nitroGLYCERIN (NITROSTAT) 0.4 MG SL tablet Place 0.4 mg under the tongue every 5 (five) minutes as needed for chest pain.   olmesartan-hydrochlorothiazide (BENICAR HCT) 40-25 MG tablet TAKE 1 TABLET BY MOUTH EVERY DAY   pantoprazole (PROTONIX) 20 MG tablet Take 1 tablet (20 mg total) by mouth daily.   polyethylene glycol (MIRALAX / GLYCOLAX) 17 g packet Take 17 g by mouth daily as needed for mild constipation.   potassium chloride SA (KLOR-CON M20) 20 MEQ tablet You have not needed potassium while in the hospital.  Hold until outpatient followup. (Patient taking differently: Take 20 mEq by mouth 2 (two) times daily.)   rosuvastatin (CRESTOR) 10 MG tablet Take 1 tablet (10 mg total) by mouth daily.   spironolactone (ALDACTONE) 25 MG tablet Take 25 mg by mouth daily.   terconazole (TERAZOL 7) 0.4 % vaginal cream Place vaginally.   WIXELA INHUB 250-50 MCG/DOSE AEPB Inhale 1 puff into the lungs 2 (two) times daily.   No facility-administered encounter medications on file as of 02/27/2021.    Patient Active Problem List   Diagnosis Date Noted   Cervical spondylosis 02/12/2021   Lumbar facet arthropathy 02/12/2021   Recurrent UTI 11/13/2020   Angina pectoris (HCV[place]10/26/2022   Major depressive disorder, recurrent episode, moderate (HCC) 11/13/2020   Weakness of right arm    Adrenal adenoma, right    PAD (peripheral artery disease) (HCBothell East01/05/2020   Abnormal ankle brachial index (ABI) 12/18/2019   CAD (coronary artery disease) 04/27/2019   Myalgia 04/19/2019   Diabetic polyneuropathy associated with type 2 diabetes mellitus (HCLarr[place]02/12/2018   Vitamin D deficiency 01/24/2018    Overactive bladder 05/18/2017   Dilated aortic root (HCC)    Sacroiliac dysfunction 10/09/2014   Chronic low back pain 09/03/2014   CVA, old, hemiparesis (HCStr[place]08/15/2016   OSA (obstructive sleep apnea) 05/15/2014   Type 2 diabetes mellitus with hyperlipidemia (HCVandenberg AFB01/03/2014   Chronic pain syndrome 01/21/2014   Headache disorder 09/07/2013   Mixed urge and stress incontinence 08/25/2011   Ureteral stricture, right 08/25/2011   Pyelonephritis 08/22/2011   Nephrolithiasis 08/22/2011   Incisional hernia 05/10/2011   Class 3 severe obesity with serious comorbidity and body mass index (BMI) of 40.0 to 44.9 in adult (HCCl[place]04/21/2013   [place], [place] 09/14/2006   Hyperlipemia 09/13/2006   Accelerated hypertension 09/13/2006   GERD 09/13/2006   Seizure disorder (HCKetch[place]08/25/2008    Patient Care Plan: RN Care Management Plan of Care     Problem Identified: DM, HTN, HLD, COPD and Fall Risk      Long-Range Goal: Disease Progression Minimized or Managed   Start Date: 11/29/2020  Expected End Date: 02/27/2021  Priority: High  Note:   Current Barriers:  Chronic Disease Management support and education needs related to HTN, HLD, COPD, DMII, and Fall Risk  RNCM Clinical Goal(s):  Patient will continue to work with RN Care Manager to address care management and care coordination needs related to  HTN, HLD, COPD, DMII, and Fall Risk.  Interventions: 1:1 collaboration with primary care provider regarding development and update of comprehensive plan of care as evidenced by provider attestation and co-signature Inter-disciplinary care team collaboration (see longitudinal plan of care) Evaluation of current treatment plan related to  self management and patient's adherence to plan as established by provider  Hypertension Interventions: Reviewed plan for hypertension management. Reports recent BP readings have been within range. Reports excellent compliance with medications. No  new concerns regarding medication management or prescription cost. Advised to continue monitoring. Advised to record readings to identify trends. Reviewed complications r/t uncontrolled blood pressure. Reviewed s/sx of heart attack, stroke and worsening symptoms that require immediate medical attention.   Diabetes Interventions: (Goal on Track) (No Interventions during this outreach) Assessed patient's understanding of A1c goal: <7% Reviewed plan for diabetes management. Reports taking medications as prescribed and managing well. Reviewed recent blood glucose readings. Reports improvements with readings. Recalls experiencing one episode of hypoglycemia when level dropped to 82 mg/dl. Reports level increased 139 mg/dl after eating a snack. Advised to continue closely monitoring readings on her Dexcom.  Reviewed appointments. Advised to continue attending follow-ups with the Endocrinology team as scheduled. Her A1C has improved. Currently at 8.2%. Reports being motivated to improve her overall diabetes management.   Fall Risk Interventions: Reviewed safety and fall prevention measures and plan for physical therapy. Reports completing the first PT session on 02/24/2021. Reports tolerating the session well. Experienced increased neck discomfort afterwards. Reports following all recommended activity restrictions and completing recommended stretches and joint exercises in the home. Continues to use her cane or walker with all ambulation. No changes in ability to perform ADLs and self-care. Encouraged to attend all PT sessions as scheduled. Agreed to contact the care management team if she requires assistance with transportation.  Patient Goals/Self-Care Activities: Patient will self administer medications as prescribed Patient will attend all scheduled provider appointments Patient will call pharmacy for medication refills Patient will continue to perform ADL's independently Patient will call provider  office for new concerns or questions    Follow Up Plan:   Will follow up within the next month     PLAN A member of the care management team will follow up within the next month.   [name] Health/THN Care Management [facility] [phone]

## 2021-03-05 ENCOUNTER — Other Ambulatory Visit: Payer: Medicare Other

## 2021-03-07 ENCOUNTER — Telehealth: Payer: Self-pay

## 2021-03-07 NOTE — Telephone Encounter (Signed)
Called patient for VV on 04/07/2021 She is wanting a refill on Butalbital/Acet 50-325-58m that she received in December for the ED.  She was also order to see PT and is going today for her second appt. Patient would like to discuss plan of care. And get a refill.

## 2021-03-10 ENCOUNTER — Other Ambulatory Visit: Payer: Self-pay

## 2021-03-10 ENCOUNTER — Encounter: Payer: Self-pay | Admitting: Student in an Organized Health Care Education/Training Program

## 2021-03-10 ENCOUNTER — Ambulatory Visit: Payer: Medicare Other | Admitting: Endocrinology

## 2021-03-10 ENCOUNTER — Ambulatory Visit
Payer: Medicare Other | Attending: Student in an Organized Health Care Education/Training Program | Admitting: Student in an Organized Health Care Education/Training Program

## 2021-03-10 DIAGNOSIS — M791 Myalgia, unspecified site: Secondary | ICD-10-CM | POA: Diagnosis not present

## 2021-03-10 DIAGNOSIS — G890 Central pain syndrome: Secondary | ICD-10-CM

## 2021-03-10 DIAGNOSIS — G894 Chronic pain syndrome: Secondary | ICD-10-CM

## 2021-03-10 DIAGNOSIS — E1142 Type 2 diabetes mellitus with diabetic polyneuropathy: Secondary | ICD-10-CM | POA: Diagnosis not present

## 2021-03-10 MED ORDER — BUTALBITAL-APAP-CAFFEINE 50-325-40 MG PO TABS: 1.0000 | ORAL_TABLET | Freq: Four times a day (QID) | ORAL | 5 refills | Status: DC | PRN

## 2021-03-10 NOTE — Progress Notes (Signed)
Patient: [name]  Service Category: E/M  Provider: Gillis Santa, MD  DOB: [birth year]  DOS: 03/10/2021  Location: Office  MRN: [id number]  Setting: Ambulatory outpatient  Referring Provider: Delsa Grana, [name]  Type: Established Patient  Specialty: Interventional Pain Management  PCP: Delsa Grana, [name]  Location: Home  Delivery: TeleHealth     Virtual Encounter - Pain Management PROVIDER NOTE: Information contained herein reflects review and annotations entered in association with encounter. Interpretation of such information and data should be left to medically-trained personnel. Information provided to patient can be located elsewhere in the medical record under "Patient Instructions". Document created using STT-dictation technology, any transcriptional errors that may result from process are unintentional.    Contact & Pharmacy Preferred: [phone] Home: [phone] (home) Mobile: [phone] (mobile) E-mail: [email or web address]  [id number] [id number]: [phone]: [phone]  [facility] [place] [id number]: [phone]: [phone]  Pre-screening  Ms. Mousel offered "in-person" vs "virtual" encounter. She indicated preferring virtual for this encounter.   Reason COVID-19*   Social distancing based on CDC and AMA recommendations.   I contacted LDeirdre Eveneron 03/10/2021 via telephone.      I clearly identified myself as BGillis Santa MD. I verified that I was speaking with the correct person using two identifiers (Name: LBree Heinzelman and date of birth: [birth year].  Consent I sought verbal advanced consent from [name]. I informed Ms. MShutterof possible security and privacy concerns, risks, and limitations associated with providing "not-in-person" medical evaluation  and management services. I also informed Ms. MLaatschof the availability of "in-person" appointments. Finally, I informed her that there would be a charge for the virtual visit and that she could be  personally, fully or partially, financially responsible for it. Ms. MBickingexpressed understanding and agreed to proceed.   Historic Elements   Ms. LKasiya Burckis a 56y.o. year old, female patient evaluated today after our last contact on 01/07/2021. Ms. MLightsey has a past medical history of Anxiety and depression (09/13/2006), Arthritis, Asthma, COPD (chronic obstructive pulmonary disease) ([place], Depression, Diabetes mellitus, Diabetic neuropathy, painful ([place], Diabetic retinopathy ([place], Diastolic dysfunction, Dilated aortic root ([place], Dizziness, Epilepsy idiopathic petit mal ([place], GERD (gastroesophageal reflux disease), Glaucoma, Headache(784.0), Heart murmur, History of stress test, blood clots, Hyperlipidemia, Hypertension, Legally blind, MIGRAINE HEADACHE (09/14/2006), Myocardial infarction ([place], Nephrolithiasis, Pain (09/23/11), Pneumonia, Pyelonephritis, Seizures ([place], Sleep apnea, Stress incontinence, and Stroke ([place]. She also  has a past surgical history that includes kidney stone removal; Brain hematoma evacuation; Shunt removal; Retinal detachment surgery (Left, 1990); Vaginal hysterectomy (1996); Incisional hernia repair (05/05/2011); Mass excision (05/05/2011); PCNL; Cystoscopy/retrograde/ureteroscopy (08/24/2011); Cystoscopy with ureteroscopy (09/24/2011); Cystoscopy w/ retrogrades (09/24/2011); and RIGHT/LEFT HEART CATH AND CORONARY ANGIOGRAPHY (N/A, 04/28/2019). Ms. MCarinohas a current medication list which includes the following prescription(s): albuterol, albuterol, amitriptyline, amlodipine, baclofen, budesonide, carvedilol, cholecalciferol, ciclopirox, clonidine, clonidine, clopidogrel, freestyle libre 2 sensor, cyclosporine, trulicity, duloxetine, epinephrine, ezetimibe, farxiga, fluticasone,  gabapentin, accu-chek guide, hydralazine, hydrochlorothiazide, omnipod dash pods (gen 4), insulin lispro, levetiracetam, mirabegron er, nitroglycerin, olmesartan-hydrochlorothiazide, pantoprazole, polyethylene glycol, potassium chloride sa, rosuvastatin, spironolactone, terconazole, wixela inhub, butalbital-acetaminophen-caffeine, and hydralazine. She  reports that she quit smoking about 34 years ago. Her smoking use included cigarettes. She has a 15.00 pack-year smoking history. She has never used smokeless tobacco. She  reports current alcohol use of about 1.0 standard drink per week. She reports that she does not use drugs. Ms. Bosch is allergic to codeine and lactose intolerance (gi).   HPI  Today, she is being contacted for medication management.  Refill of Fiorcet  Laboratory Chemistry Profile   Renal Lab Results  Component Value Date   BUN 14 12/31/2020   CREATININE 0.80 12/31/2020   LABCREA 48 00/76/2263   BCR NOT APPLICABLE 33/54/5625   GFR 76.80 09/30/2020   GFRAA >60 05/30/2019   GFRNONAA >60 12/31/2020    Hepatic Lab Results  Component Value Date   AST 18 12/31/2020   ALT 19 12/31/2020   ALBUMIN 3.7 12/31/2020   ALKPHOS 78 12/31/2020   LIPASE 46 06/13/2020    Electrolytes Lab Results  Component Value Date   NA 136 12/31/2020   K 3.9 12/31/2020   CL 102 12/31/2020   CALCIUM 9.0 12/31/2020   MG 1.9 06/14/2020   PHOS 3.0 06/14/2020    Bone Lab Results  Component Value Date   VD25OH 26 (L) 01/24/2018    Inflammation (CRP: Acute Phase) (ESR: Chronic Phase) Lab Results  Component Value Date   CRP 2.5 11/14/2018   ESRSEDRATE 29 11/14/2018   LATICACIDVEN 0.9 02/22/2014         Note: Above Lab results reviewed.  Imaging  MR CERVICAL SPINE WO CONTRAST CLINICAL DATA:  Neck pain radiating into the left arm for years. No known injury or prior relevant surgery.  EXAM: MRI CERVICAL SPINE WITHOUT CONTRAST  TECHNIQUE: Multiplanar, multisequence MR imaging of  the cervical spine was performed. No intravenous contrast was administered.  COMPARISON:  None available. Report only from outside radiographs 11/18/2020.  FINDINGS: Alignment: Straightening without focal angulation or listhesis.  Vertebrae: No acute or suspicious osseous findings.  Cord: Normal in signal and caliber.  Posterior Fossa, vertebral arteries, paraspinal tissues: Visualized portions of the posterior fossa appear unremarkable.Bilateral vertebral artery flow voids. No significant paraspinal findings.  Disc levels:  C2-3: Small central disc protrusion and mild left-sided facet hypertrophy. No spinal stenosis or nerve root encroachment.  C3-4: Mild disc bulging and uncinate spurring. No significant spinal stenosis or nerve root encroachment.  C4-5: Small disc protrusion, mild disc bulging and mild bilateral uncinate spurring. No spinal stenosis. Mild left foraminal narrowing.  C5-6: Spondylosis with loss of disc height and posterior osteophytes covering diffusely bulging disc material. There is a left foraminal disc osteophyte complex causing moderate to severe left foraminal narrowing and probable left C6 nerve root encroachment. No cord deformity. The right foramen is mildly narrowed.  C6-7: Mild disc bulging and uncinate spurring. Mild right foraminal narrowing. No cord deformity.  C7-T1: No significant findings.  IMPRESSION: 1. Mild multilevel cervical spondylosis without cord deformity or abnormal cord signal. 2. Left foraminal disc osteophyte complex at C5-6 causes moderate to severe left foraminal narrowing and probable left C6 nerve root encroachment. This may contribute to the patient's symptoms. 3. No other significant spinal stenosis or nerve root encroachment identified.  Electronically Signed   By: Richardean Sale M.D.   On: 02/03/2021 14:24  Assessment  The primary encounter diagnosis was Diabetic polyneuropathy associated with type 2  diabetes mellitus ([place]). Diagnoses of Central pain syndrome, Myalgia, and Chronic pain syndrome were also pertinent to this visit.  Plan of Care  Problem-specific:  No problem-specific Assessment & Plan notes found for this encounter.  Ms. Reily Treloar has a current medication list which includes the following long-term medication(s): albuterol,  albuterol, amitriptyline, amlodipine, budesonide, clonidine, clonidine, duloxetine, ezetimibe, fluticasone, gabapentin, hydralazine, insulin lispro, levetiracetam, mirabegron er, olmesartan-hydrochlorothiazide, pantoprazole, potassium chloride sa, rosuvastatin, spironolactone, wixela inhub, and hydralazine.  Pharmacotherapy (Medications Ordered): Meds ordered this encounter  Medications   butalbital-acetaminophen-caffeine (FIORICET) 50-325-40 MG tablet    Sig: Take 1-2 tablets by mouth every 6 (six) hours as needed for headache.    Dispense:  30 tablet    Refill:  5    Follow-up plan:   Return if symptoms worsen or fail to improve.    Recent Visits Date Type Provider Dept  01/07/21 Office Visit Gillis Santa, MD [facility]  Showing recent visits within past 90 days and meeting all other requirements Today's Visits Date Type Provider Dept  03/10/21 Office Visit Gillis Santa, MD [facility]  Showing today's visits and meeting all other requirements Future Appointments No visits were found meeting these conditions. Showing future appointments within next 90 days and meeting all other requirements  I discussed the assessment and treatment plan with the patient. The patient was provided an opportunity to ask questions and all were answered. The patient agreed with the plan and demonstrated an understanding of the instructions.  Patient advised to call back or seek an in-person evaluation if the symptoms or condition worsens.  Duration of encounter: 96mnutes.  Note by: BGillis Santa MD Date: 03/10/2021; Time:  3:21 PM

## 2021-03-18 ENCOUNTER — Other Ambulatory Visit: Payer: Self-pay | Admitting: Family Medicine

## 2021-03-18 DIAGNOSIS — I10 Essential (primary) hypertension: Secondary | ICD-10-CM

## 2021-03-18 DIAGNOSIS — M62838 Other muscle spasm: Secondary | ICD-10-CM

## 2021-03-18 DIAGNOSIS — G894 Chronic pain syndrome: Secondary | ICD-10-CM

## 2021-03-20 ENCOUNTER — Ambulatory Visit (INDEPENDENT_AMBULATORY_CARE_PROVIDER_SITE_OTHER): Payer: Medicare Other

## 2021-03-20 ENCOUNTER — Other Ambulatory Visit: Payer: Medicare Other

## 2021-03-20 DIAGNOSIS — Z0000 Encounter for general adult medical examination without abnormal findings: Secondary | ICD-10-CM | POA: Diagnosis not present

## 2021-03-20 NOTE — Patient Instructions (Signed)
[name] , Thank you for taking time to come for your Medicare Wellness Visit. I appreciate your ongoing commitment to your health goals. Please review the following plan we discussed and let me know if I can assist you in the future.   Screening recommendations/referrals: Colonoscopy: Cologuard results needed Mammogram: done 08/13/15. Please call [phone] to schedule your mammogram.  Bone Density: due age 56 Recommended yearly ophthalmology/optometry visit for glaucoma screening and checkup Recommended yearly dental visit for hygiene and checkup  Vaccinations: Influenza vaccine: done 11/13/20 Pneumococcal vaccine: done 04/29/19 Tdap vaccine: done 06/23/18 Shingles vaccine: Shingrix discussed. Please contact your pharmacy for coverage information.  Covid-19:  done 11/29/19 & 12/20/19  Conditions/risks identified: Recommend increasing activity as tolerated  Next appointment: Follow up in one year for your annual wellness visit.   Preventive Care 40-64 Years, Female Preventive care refers to lifestyle choices and visits with your health care provider that can promote health and wellness. What does preventive care include? A yearly physical exam. This is also called an annual well check. Dental exams once or twice a year. Routine eye exams. Ask your health care provider how often you should have your eyes checked. Personal lifestyle choices, including: Daily care of your teeth and gums. Regular physical activity. Eating a healthy diet. Avoiding tobacco and drug use. Limiting alcohol use. Practicing safe sex. Taking low-dose aspirin daily starting at age 56. Taking vitamin and mineral supplements as recommended by your health care provider. What happens during an annual well check? The services and screenings done by your health care provider during your annual well check will depend on your age, overall health, lifestyle risk factors, and family history of disease. Counseling   Your health care provider may ask you questions about your: Alcohol use. Tobacco use. Drug use. Emotional well-being. Home and relationship well-being. Sexual activity. Eating habits. Work and work Statistician. Method of birth control. Menstrual cycle. Pregnancy history. Screening  You may have the following tests or measurements: Height, weight, and BMI. Blood pressure. Lipid and cholesterol levels. These may be checked every 5 years, or more frequently if you are over 52 years old. Skin check. Lung cancer screening. You may have this screening every year starting at age 87 if you have a 30-pack-year history of smoking and currently smoke or have quit within the past 15 years. Fecal occult blood test (FOBT) of the stool. You may have this test every year starting at age 3. Flexible sigmoidoscopy or colonoscopy. You may have a sigmoidoscopy every 5 years or a colonoscopy every 10 years starting at age 49. Hepatitis C blood test. Hepatitis B blood test. Sexually transmitted disease (STD) testing. Diabetes screening. This is done by checking your blood sugar (glucose) after you have not eaten for a while (fasting). You may have this done every 1-3 years. Mammogram. This may be done every 1-2 years. Talk to your health care provider about when you should start having regular mammograms. This may depend on whether you have a family history of breast cancer. BRCA-related cancer screening. This may be done if you have a family history of breast, ovarian, tubal, or peritoneal cancers. Pelvic exam and Pap test. This may be done every 3 years starting at age 5. Starting at age 22, this may be done every 5 years if you have a Pap test in combination with an HPV test. Bone density scan. This is done to screen for osteoporosis. You may have this scan if you are at high risk for  osteoporosis. Discuss your test results, treatment options, and if necessary, the need for more tests with your health  care provider. Vaccines  Your health care provider may recommend certain vaccines, such as: Influenza vaccine. This is recommended every year. Tetanus, diphtheria, and acellular pertussis (Tdap, Td) vaccine. You may need a Td booster every 10 years. Zoster vaccine. You may need this after age 48. Pneumococcal 13-valent conjugate (PCV13) vaccine. You may need this if you have certain conditions and were not previously vaccinated. Pneumococcal polysaccharide (PPSV23) vaccine. You may need one or two doses if you smoke cigarettes or if you have certain conditions. Talk to your health care provider about which screenings and vaccines you need and how often you need them. This information is not intended to replace advice given to you by your health care provider. Make sure you discuss any questions you have with your health care provider. Document Released: 02/01/2015 Document Revised: 09/25/2015 Document Reviewed: 11/06/2014 Elsevier Interactive Patient Education  2017 [place] Prevention in the Home Falls can cause injuries. They can happen to people of all ages. There are many things you can do to make your home safe and to help prevent falls. What can I do on the outside of my home? Regularly fix the edges of walkways and driveways and fix any cracks. Remove anything that might make you trip as you walk through a door, such as a raised step or threshold. Trim any bushes or trees on the path to your home. Use bright outdoor lighting. Clear any walking paths of anything that might make someone trip, such as rocks or tools. Regularly check to see if handrails are loose or broken. Make sure that both sides of any steps have handrails. Any raised decks and porches should have guardrails on the edges. Have any leaves, snow, or ice cleared regularly. Use sand or salt on walking paths during winter. Clean up any spills in your garage right away. This includes oil or grease  spills. What can I do in the bathroom? Use night lights. Install grab bars by the toilet and in the tub and shower. Do not use towel bars as grab bars. Use non-skid mats or decals in the tub or shower. If you need to sit down in the shower, use a plastic, non-slip stool. Keep the floor dry. Clean up any water that spills on the floor as soon as it happens. Remove soap buildup in the tub or shower regularly. Attach bath mats securely with double-sided non-slip rug tape. Do not have throw rugs and other things on the floor that can make you trip. What can I do in the bedroom? Use night lights. Make sure that you have a light by your bed that is easy to reach. Do not use any sheets or blankets that are too big for your bed. They should not hang down onto the floor. Have a firm chair that has side arms. You can use this for support while you get dressed. Do not have throw rugs and other things on the floor that can make you trip. What can I do in the kitchen? Clean up any spills right away. Avoid walking on wet floors. Keep items that you use a lot in easy-to-reach places. If you need to reach something above you, use a strong step stool that has a grab bar. Keep electrical cords out of the way. Do not use floor polish or wax that makes floors slippery. If you must use  wax, use non-skid floor wax. Do not have throw rugs and other things on the floor that can make you trip. What can I do with my stairs? Do not leave any items on the stairs. Make sure that there are handrails on both sides of the stairs and use them. Fix handrails that are broken or loose. Make sure that handrails are as long as the stairways. Check any carpeting to make sure that it is firmly attached to the stairs. Fix any carpet that is loose or worn. Avoid having throw rugs at the top or bottom of the stairs. If you do have throw rugs, attach them to the floor with carpet tape. Make sure that you have a light switch at the  top of the stairs and the bottom of the stairs. If you do not have them, ask someone to add them for you. What else can I do to help prevent falls? Wear shoes that: Do not have high heels. Have rubber bottoms. Are comfortable and fit you well. Are closed at the toe. Do not wear sandals. If you use a stepladder: Make sure that it is fully opened. Do not climb a closed stepladder. Make sure that both sides of the stepladder are locked into place. Ask someone to hold it for you, if possible. Clearly mark and make sure that you can see: Any grab bars or handrails. First and last steps. Where the edge of each step is. Use tools that help you move around (mobility aids) if they are needed. These include: Canes. Walkers. Scooters. Crutches. Turn on the lights when you go into a dark area. Replace any light bulbs as soon as they burn out. Set up your furniture so you have a clear path. Avoid moving your furniture around. If any of your floors are uneven, fix them. If there are any pets around you, be aware of where they are. Review your medicines with your doctor. Some medicines can make you feel dizzy. This can increase your chance of falling. Ask your doctor what other things that you can do to help prevent falls. This information is not intended to replace advice given to you by your health care provider. Make sure you discuss any questions you have with your health care provider. Document Released: 11/01/2008 Document Revised: 06/13/2015 Document Reviewed: 02/09/2014 Elsevier Interactive Patient Education  2017 [employer].

## 2021-03-20 NOTE — Progress Notes (Signed)
Subjective:   Margueritte Guthridge is a 56 y.o. female who presents for Medicare Annual (Subsequent) preventive examination.  Virtual Visit via Telephone Note  I connected with  [name] on 03/20/21 at  8:40 AM EST by telephone and verified that I am speaking with the correct person using two identifiers.  Location: Patient: home Provider: [place] Persons participating in the virtual visit: [place]   I discussed the limitations, risks, security and privacy concerns of performing an evaluation and management service by telephone and the availability of in person appointments. The patient expressed understanding and agreed to proceed.  Interactive audio and video telecommunications were attempted between this nurse and patient, however failed, due to patient having technical difficulties OR patient did not have access to video capability.  We continued and completed visit with audio only.  Some vital signs may be absent or patient reported.   [name], LPN   Review of Systems     Cardiac Risk Factors include: advanced age (>68mn, >>85women);diabetes mellitus;dyslipidemia;hypertension;sedentary lifestyle;obesity (BMI >30kg/m2)     Objective:    There were no vitals filed for this visit. There is no height or weight on file to calculate BMI.  Advanced Directives 03/20/2021 11/26/2020 09/07/2020 06/13/2020 06/13/2020 02/27/2020 05/30/2019  Does Patient Have a Medical Advance Directive? Yes Yes Yes Yes Yes Yes Yes  Type of AParamedicof APort Ar[place] will Healthcare Power of [place]of [place]of [place]Living will [place]Living will H[place] Does patient want to make changes to medical advance directive? - - - No - Patient declined - - -  Copy of HE[place]in Chart? Yes - validated most recent copy  scanned in chart (See row information) - - No - copy requested - Yes - validated most recent copy scanned in chart (See row information) -  Would patient like information on creating a medical advance directive? - - - - - - -  Pre-existing out of facility DNR order (yellow form or pink MOST form) - - - - - - -    Current Medications (verified) Outpatient Encounter Medications as of 03/20/2021  Medication Sig   albuterol (ACCUNEB) 1.25 MG/3ML nebulizer solution Inhale 1 ampule into the lungs every 6 (six) hours as needed for wheezing or shortness of breath.   albuterol (PROAIR HFA) 108 (90 Base) MCG/ACT inhaler INHALE 2 PUFFS BY MOUTH EVERY 6 HOURS AS NEEDED FOR WHEEZING OR SHORTNESS OF BREATH   amitriptyline (ELAVIL) 25 MG tablet Take 2-3 tablets (50-75 mg total) by mouth at bedtime.   amLODipine (NORVASC) 10 MG tablet Take 1 tablet by mouth daily.   baclofen (LIORESAL) 10 MG tablet Take 1 tablet (10 mg total) by mouth 3 (three) times daily.   budesonide (PULMICORT) 0.5 MG/2ML nebulizer solution Inhale 0.5 mg into the lungs daily.   butalbital-acetaminophen-caffeine (FIORICET) 50-325-40 MG tablet Take 1-2 tablets by mouth every 6 (six) hours as needed for headache.   carvedilol (COREG) 25 MG tablet Take 25 mg by mouth 2 (two) times daily.   cholecalciferol (VITAMIN D3) 25 MCG (1000 UNIT) tablet Take 1,000 Units by mouth daily.   ciclopirox (PENLAC) 8 % solution Apply topically at bedtime.   cloNIDine (CATAPRES) 0.1 MG tablet Take 1 tablet (0.1 mg total) by mouth 2 (two) times daily.   clopidogrel (PLAVIX) 75 MG tablet Take 1 tablet (75 mg total) by mouth daily.  Continuous Blood Gluc Sensor (FREESTYLE LIBRE 2 SENSOR) MISC Change every 14 days   cycloSPORINE (RESTASIS) 0.05 % ophthalmic emulsion Place 2 drops into both eyes 2 (two) times daily.   Dulaglutide (TRULICITY) 4.5 UE/2.8MK SOPN Inject 4.5 mg as directed once a week.   DULoxetine (CYMBALTA) 60 MG capsule Take 1 capsule (60 mg total) by  mouth daily.   EPINEPHrine 0.3 mg/0.3 mL IJ SOAJ injection Inject 0.3 mg into the muscle as needed for anaphylaxis.   ezetimibe (ZETIA) 10 MG tablet Take 1 tablet (10 mg total) by mouth daily.   FARXIGA 10 MG TABS tablet TAKE 1 TABLET BY MOUTH EVERY DAY   fluticasone (FLONASE) 50 MCG/ACT nasal spray Place 2 sprays into both nostrils daily.   gabapentin (NEURONTIN) 600 MG tablet 600 mg qAM, 600 mg qPM, 1200 mg QHS   glucose blood (ACCU-CHEK GUIDE) test strip Use as instructed to check blood sugar 4 times per day Dx code E11.9   hydrALAZINE (APRESOLINE) 50 MG tablet Take 1 tablet by mouth 3 (three) times daily.   hydrochlorothiazide (HYDRODIURIL) 25 MG tablet Take 25 mg by mouth daily.   Insulin Disposable Pump (OMNIPOD DASH PODS, GEN 4,) MISC Change pod once daily   insulin lispro (HUMALOG) 100 UNIT/ML injection USE UP TO 150 UNITS/DAY IN INSULIN PUMP   levETIRAcetam (KEPPRA) 500 MG tablet Take 500-750 mg by mouth See admin instructions. Take 1 tablet (539m) by mouth every morning and take 1 tablets (7561m by mouth every night   mirabegron ER (MYRBETRIQ) 50 MG TB24 tablet Take 1 tablet (50 mg total) by mouth daily.   nitroGLYCERIN (NITROSTAT) 0.4 MG SL tablet Place 0.4 mg under the tongue every 5 (five) minutes as needed for chest pain.   olmesartan-hydrochlorothiazide (BENICAR HCT) 40-25 MG tablet TAKE 1 TABLET BY MOUTH EVERY DAY   pantoprazole (PROTONIX) 20 MG tablet Take 1 tablet (20 mg total) by mouth daily.   polyethylene glycol (MIRALAX / GLYCOLAX) 17 g packet Take 17 g by mouth daily as needed for mild constipation.   potassium chloride SA (KLOR-CON M20) 20 MEQ tablet You have not needed potassium while in the hospital.  Hold until outpatient followup. (Patient taking differently: Take 20 mEq by mouth 2 (two) times daily.)   rosuvastatin (CRESTOR) 10 MG tablet Take 1 tablet (10 mg total) by mouth daily.   spironolactone (ALDACTONE) 25 MG tablet Take 25 mg by mouth daily.   terconazole  (TERAZOL 7) 0.4 % vaginal cream Place vaginally.   WIXELA INHUB 250-50 MCG/DOSE AEPB Inhale 1 puff into the lungs 2 (two) times daily.   [DISCONTINUED] carvedilol (COREG) 12.5 MG tablet Take 12.5 mg by mouth 2 (two) times daily with a meal.   [DISCONTINUED] cloNIDine (CATAPRES) 0.1 MG tablet Take by mouth.   [DISCONTINUED] hydrALAZINE (APRESOLINE) 25 MG tablet Take 1 tablet (25 mg total) by mouth every 8 (eight) hours.   No facility-administered encounter medications on file as of 03/20/2021.    Allergies (verified) Codeine and Lactose intolerance (gi)   History: Past Medical History:  Diagnosis Date   Anxiety and depression 09/13/2006   Qualifier: Diagnosis of  By: MaHassell DoneNP, Nykedtra     Arthritis    joint pain    Asthma    COPD (chronic obstructive pulmonary disease) ([facility]   chronic bronchitis    Depression    Diabetes mellitus    since age [age]type 2 IDDM   Diabetic neuropathy, painful ([facility]   FEET AND HANDS  Diabetic retinopathy ([place])    Diastolic dysfunction    a.  Echo 11/17: EF 60-65%, mild LVH, no RWMA, Gr1DD, mild AI, dilated aortic root measuring 38 mm, mildly dilated ascending aorta   Dilated aortic root (HCC)    66m by echo 11/2015   Dizziness    secondary to diabetes and hypertension    Epilepsy idiopathic petit mal (HCC)    last seizure 2012;controlled w/ topomax   GERD (gastroesophageal reflux disease)    Glaucoma    NOT ON ANY EYE DROPS    Headache(784.0)    migraines    Heart murmur    born with    History of stress test    a. 11/17: Normal perfusion, EF 53%, normal study   Hx of blood clots    hematomas removed from left side of brain from [id number] [id number]    Hyperlipidemia    Hypertension    Legally blind    left eye    MIGRAINE HEADACHE 09/14/2006   Qualifier: Diagnosis of  By: MarHassell DoneP, Nykedtra     [facility] ([facility]  a.  Patient reported history of without objective documentation   Nephrolithiasis    frequent urination ,  urination at nite  PT SEEN IN ER 09/22/11 FOR BACK AND RT SIDED PAIN--HAS KNOWN STONE RT URETER AND UA IN ER SHOWED UTI   Pain 09/23/11   LOWER BACK AND RIGHT SIDE--PT HAS RIGHT URETERAL STONE   Pneumonia    hx of 2009   Pyelonephritis    Seizures ([facility]  [facility] ap[facility]    [facility] ; mild   Stress incontinence    Stroke ([facility]  last 2003  RESIDUAL LEFT LEG WEAKNESS--NO OTHER RESIDUAL PROBLEMS   Past Surgical History:  Procedure Laterality Date   BRAIN HEMATOMA EVACUATION     five procedures total, first procedure when 10 53 monthsd, last at 4 y13ars of age.   CYSTOSCOPY W/ RETROGRADES  09/24/2011   Procedure: CYSTOSCOPY WITH RETROGRADE PYELOGRAM;  Surgeon: JohMalka SoD;  [facility];  Service: Urology;  Laterality: Right;   CYSTOSCOPY WITH URETEROSCOPY  09/24/2011   Procedure: CYSTOSCOPY WITH URETEROSCOPY;  Surgeon: JohMalka SoD;  [facility];  Service: Urology;  Laterality: Right;  Balloon dilation right ureter    CYSTOSCOPY/RETROGRADE/URETEROSCOPY  08/24/2011   Procedure: CYSTOSCOPY/RETROGRADE/URETEROSCOPY;  Surgeon: DanMolli HazardD;  [facility];  Service: Urology;  Laterality: Right;  Cysto, Right retrograde Pyelogram, right stent placement.    INCISIONAL HERNIA REPAIR  05/05/2011   Procedure: LAPAROSCOPIC INCISIONAL HERNIA;  Surgeon: MatRolm BookbinderD;  [facility];  Laterality: N/A;   kidney stone removal     MASS EXCISION  05/05/2011   Procedure: EXCISION MASS;  Surgeon: MatRolm BookbinderD;  [facility];  Laterality: Right;   [facility]     RETINAL DETACHMENT SURGERY Left 1990   RIGHT/LEFT HEART CATH AND CORONARY ANGIOGRAPHY N/A 04/28/2019   Procedure: RIGHT/LEFT HEART CATH AND CORONARY ANGIOGRAPHY;  Surgeon: CalYolonda KidaD;  [facility];  Service: Cardiovascular;  Laterality: N/A;   SHUNT REMOVAL     shunt inserted at age 38 r18moved at age 21 34  VAGFountainFamily History  Problem Relation Age of Onset   Uterine cancer Mother    Hypertension Mother    Cancer  Mother    Brain cancer Maternal Grandmother    Hypertension Maternal Grandmother    Birth defects Daughter    Hypertension Daughter    Cirrhosis Maternal Grandfather    ADD / ADHD Maternal Grandfather    Birth defects Maternal Grandfather    Diabetes Maternal Grandfather    Anesthesia problems Neg Hx    Colon cancer Neg Hx    Esophageal cancer Neg Hx    Pancreatic cancer Neg Hx    Social History   Socioeconomic History   Marital status: Widowed    Spouse name: Not on file   Number of children: 2   Years of education: Not on file   Highest education level: Not on file  Occupational History   Occupation: disabled  Tobacco Use   Smoking status: Former    Packs/day: 1.00    Years: 15.00    Pack years: 15.00    Types: Cigarettes    Quit date: 01/20/1987    Years since quitting: 34.1   Smokeless tobacco: Never   Tobacco comments:    quit more than 20 years  Vaping Use   Vaping Use: Never used  Substance and Sexual Activity   Alcohol use: Yes    Alcohol/week: 1.0 standard drink    Types: 1 Glasses of wine per week   Drug use: No    Comment: hx of marijuana use no longer uses    Sexual activity: Yes    Partners: Male    Birth control/protection: Surgical  Other Topics Concern   Not on file  Social History Narrative   Pt lives with her daughter   Social Determinants of Health   Financial Resource Strain: Low Risk    Difficulty of Paying Living Expenses: Not very hard  Food Insecurity: No Food Insecurity   Worried About Charity fundraiser in the Last Year: Never true   Ran Out of Food in the Last Year: Never true  Transportation Needs: No Transportation Needs   Lack of Transportation (Medical): No   Lack of Transportation (Non-Medical): No  Physical Activity: Insufficiently Active   Days of Exercise per Week: 2 days   Minutes of  Exercise per Session: 10 min  Stress: No Stress Concern Present   Feeling of Stress : Not at all  Social Connections: Socially Isolated   Frequency of Communication with Friends and Family: More than three times a week   Frequency of Social Gatherings with Friends and Family: More than three times a week   Attends Religious Services: Never   Marine scientist or Organizations: No   Attends Archivist Meetings: Never   Marital Status: Widowed    Tobacco Counseling Counseling given: Not Answered Tobacco comments: quit more than 20 years   Clinical Intake:  Pre-visit preparation completed: Yes  Pain : No/denies pain     Nutritional Risks: None Diabetes: Yes CBG done?: No Did pt. bring in CBG monitor from home?: No  How often do you need to have someone help you when you read instructions, pamphlets, or other written materials from your doctor or pharmacy?: 1 - Never  Nutrition Risk Assessment:  Has the patient had any N/V/D within the last 2 months?  No  Does the patient have any non-healing wounds?  No  Has the patient had any unintentional weight loss or weight gain?  No   Diabetes:  Is the patient diabetic?  Yes  If diabetic, was a CBG obtained today?  No  Did the patient bring in their glucometer from home?  No  How often do you monitor your CBG's? 4-5 times daily.   Financial Strains and Diabetes Management:  Are you having any financial strains with the device, your supplies or your medication? No .  Does the patient want to be seen by Chronic Care Management for management of their diabetes?  No  Would the patient like to be referred to a Nutritionist or for Diabetic Management?  No   Diabetic Exams:  Diabetic Eye Exam: Overdue for diabetic eye exam. Pt has been advised about the importance in completing this exam.   Diabetic Foot Exam: Completed 12/26/20.   Interpreter Needed?: No  Information entered by :: [name] LPN   Activities  of Daily Living In your present state of health, do you have any difficulty performing the following activities: 03/20/2021 02/14/2021  Hearing? N N  Vision? Y Y  Difficulty concentrating or making decisions? N N  Walking or climbing stairs? Y Y  Dressing or bathing? N N  Doing errands, shopping? Tempie Donning  Preparing Food and eating ? N -  Using the Toilet? N -  In the past six months, have you accidently leaked urine? Y -  Comment wears pads for protection -  Do you have problems with loss of bowel control? N -  Managing your Medications? N -  Managing your Finances? N -  Housekeeping or managing your Housekeeping? N -  Some recent data might be hidden    Patient Care Team: Delsa Grana, [name] as PCP - General (Family Medicine) Elayne Snare, MD as Consulting Physician (Endocrinology) Anabel Bene, MD as Referring Physician (Neurology) Yolonda Kida, MD as Consulting Physician (Cardiology) Gillis Santa, MD as Consulting Physician (Pain Medicine) Neldon Labella, RN as Case Manager Ottie Glazier, MD as Consulting Physician (Pulmonary Disease) Samara Deist, DPM as Referring Physician (Podiatry)  Indicate any recent Medical Services you may have received from other than [facility] providers in the past year (date may be approximate).     Assessment:   This is a routine wellness examination for [place].  Hearing/Vision screen Hearing Screening - Comments:: Pt denies hearing difficulty Vision Screening - Comments:: Annual vision screenings done at [facility]; plans to go to [place] issues and exercise activities discussed: Current Exercise Habits: Home exercise routine, Type of exercise: walking, Time (Minutes): 10, Frequency (Times/Week): 2, Weekly Exercise (Minutes/Week): 20, Intensity: Mild, Exercise limited by: orthopedic condition(s);neurologic condition(s)   Goals Addressed   None    Depression Screen PHQ 2/9 Scores 03/20/2021 02/14/2021 02/14/2021 01/10/2021  12/10/2020 11/26/2020 11/13/2020  PHQ - 2 Score 0 0 0 0 0 0 0  PHQ- 9 Score 0 0 - 0 0 - 0    Fall Risk Fall Risk  03/20/2021 02/14/2021 01/10/2021 12/10/2020 11/26/2020  Falls in the past year? 1 1 1 1 1   Number falls in past yr: 1 1 1 1 1   Comment - - - - -  Injury with Fall? 1 1 0 0 0  Risk for fall due to : History of fall(s);Impaired balance/gait;Impaired mobility;Impaired vision History of fall(s);Impaired balance/gait;Impaired mobility;Impaired vision Impaired balance/gait Impaired balance/gait;Impaired mobility -  Risk for fall due to: Comment - - - - -  Follow up Falls prevention discussed Falls evaluation completed Falls prevention discussed - -    FALL RISK PREVENTION PERTAINING TO THE HOME:  Any stairs in or around the home? Yes  If so, are there any without handrails? No  Home free of loose throw rugs in walkways, pet beds, electrical cords, etc? Yes  Adequate lighting in your home to reduce risk of falls? Yes   ASSISTIVE DEVICES UTILIZED TO PREVENT FALLS:  Life alert? No  Use of a cane, walker or w/c? yes Grab bars in the bathroom? Yes  Shower chair or bench in shower? Yes  Elevated toilet seat or a handicapped toilet? No   TIMED UP AND GO:  Was the test performed? No . Telephonic visit.   Cognitive Function: Normal cognitive status assessed by direct observation by this Nurse Health Advisor. No abnormalities found.          Immunizations Immunization History  Administered Date(s) Administered   Influenza Split 10/07/2012   Influenza Whole 11/20/2006   Influenza, Seasonal, Injecte, Preservative Fre 11/12/2006   Influenza,inj,Quad PF,6+ Mos 10/30/2013, 10/19/2014, 01/24/2018, 10/12/2018, 11/13/2020   Influenza-Unspecified 12/20/2019   PFIZER(Purple Top)SARS-COV-2 Vaccination 11/29/2019, 12/20/2019   PPD Test 11/12/2006   Pneumococcal Conjugate-13 04/29/2015   Pneumococcal Polysaccharide-23 11/20/2006, 09/25/2011, 04/29/2019   Tdap 06/23/2018    TDAP  status: Up to date  Flu Vaccine status: Up to date  Pneumococcal vaccine status: Up to date  Covid-19 vaccine status: Completed vaccines  Qualifies for Shingles Vaccine? Yes   Zostavax completed No   Shingrix Completed?: No.    Education has been provided regarding the importance of this vaccine. Patient has been advised to call insurance company to determine out of pocket expense if they have not yet received this vaccine. Advised may also receive vaccine at local pharmacy or Health Dept. Verbalized acceptance and understanding.  Screening Tests Health Maintenance  Topic Date Due   Zoster Vaccines- Shingrix (1 of 2) Never done   Fecal DNA (Cologuard)  Never done   MAMMOGRAM  08/12/2017   COVID-19 Vaccine (3 - Pfizer risk series) 01/17/2020   OPHTHALMOLOGY EXAM  09/13/2020   FOOT EXAM  10/18/2021 (Originally 09/22/2019)   Hepatitis C Screening  10/18/2021 (Originally 09/22/1982)   PAP-Cervical Cytology Screening  06/23/2021   PAP SMEAR-Modifier  06/23/2021   HEMOGLOBIN A1C  07/07/2021   TETANUS/TDAP  06/22/2028   INFLUENZA VACCINE  Completed   HIV Screening  Completed   HPV VACCINES  Aged Out   COLONOSCOPY (Pts 45-82yr Insurance coverage will need to be confirmed)  Discontinued    Health Maintenance  Health Maintenance Due  Topic Date Due   Zoster Vaccines- Shingrix (1 of 2) Never done   Fecal DNA (Cologuard)  Never done   MAMMOGRAM  08/12/2017   COVID-19 Vaccine (3 - Pfizer risk series) 01/17/2020   OPHTHALMOLOGY EXAM  09/13/2020    Colorectal cancer screening: Type of screening: Cologuard. Completed per patient. Repeat every 3 years. Need record for most recently completed.   Mammogram status: Completed 08/13/15. Repeat every year. Ordered 10/18/20  Bone density status: due age 56 Lung Cancer Screening: (Low Dose CT Chest recommended if Age 57-80years, 30 pack-year currently smoking OR have quit w/in 15years.) does not qualify.   Additional Screening:  Hepatitis C  Screening: does qualify; postponed  Vision Screening: Recommended annual ophthalmology exams for early detection of glaucoma and other disorders of the eye. Is the patient up to date with their annual eye exam?  No  Who is the provider or what is the name of the office in which the patient attends annual eye exams? [facility].   Dental Screening: Recommended annual dental exams for proper oral hygiene  Community Resource Referral / Chronic Care Management:  CRR required this visit?  No   CCM required this visit?  No      Plan:     I have personally reviewed and noted the following in the patients chart:   Medical and social history Use of alcohol, tobacco or illicit drugs  Current medications and supplements including opioid prescriptions.  Functional ability and status Nutritional status Physical activity Advanced directives List of other physicians Hospitalizations, surgeries, and ER visits in previous 12 months Vitals Screenings to include cognitive, depression, and falls Referrals and appointments  In addition, I have reviewed and discussed with patient certain preventive protocols, quality metrics, and best practice recommendations. A written personalized care plan for preventive services as well as general preventive health recommendations were provided to patient.     [name], LPN   07/20/5951   Nurse Notes: none

## 2021-03-26 ENCOUNTER — Ambulatory Visit (INDEPENDENT_AMBULATORY_CARE_PROVIDER_SITE_OTHER): Payer: Medicare Other | Admitting: Endocrinology

## 2021-03-26 ENCOUNTER — Encounter: Payer: Self-pay | Admitting: Endocrinology

## 2021-03-26 ENCOUNTER — Other Ambulatory Visit: Payer: Self-pay

## 2021-03-26 VITALS — BP 142/100 | HR 85 | Ht 66.0 in | Wt 265.2 lb

## 2021-03-26 DIAGNOSIS — Z794 Long term (current) use of insulin: Secondary | ICD-10-CM

## 2021-03-26 DIAGNOSIS — E1165 Type 2 diabetes mellitus with hyperglycemia: Secondary | ICD-10-CM | POA: Diagnosis not present

## 2021-03-26 MED ORDER — HUMALOG KWIKPEN 200 UNIT/ML ~~LOC~~ SOPN: PEN_INJECTOR | SUBCUTANEOUS | 2 refills | Status: DC

## 2021-03-26 NOTE — Progress Notes (Signed)
Patient ID: [name], female   DOB: Jul 19, 1965, 56 y.o.   MRN: [id number]          Reason for Appointment: Follow-up for Type 2 Diabetes   History of Present Illness:          Date of diagnosis of type 2 diabetes mellitus:   1989      Background history:   She thinks she was initially treated with diet alone and subsequently given metformin With metformin she had nausea and diarrhea but she does not know if she was treated with any other medications until she was started on insulin in 2003.  Also was on glipizide in 2015 Most likely she was on Levemir or Lantus insulin for quite some time She was then switched to Toujeo in 2016 and although she was initially prescribed 90 units twice a day this was subsequently lowered  Her A1c has been occasionally between 7.2 and 7.5 but has been consistently over 9% since 2018  INSULIN regimen previously: 80 U Toujeo hs, Novolog 20-25 units before meals based on blood sugar     Recent history:     OMNIPOD PUMP settings:   Basal settings: 12:00 AM-12 AM = 5.05, 2 pm 5.6 BOLUS settings: Carbohydrate coverage 1: 1, maximum bolus 6 units  Sensitivity 1: 50 and target 140   Non-insulin hypoglycemic drugs the patient is taking are: Trulicity 3.0 mg weekly, Farxiga 10 mg daily  Her A1c is last better at 8.2, previous range 7.8 -12.6  Current management, blood sugar patterns and problems identified:  She is able to use the freestyle libre now and data was reviewed  However she appears to have somewhat variable readings and mostly high daytime readings now  She apparently is only entering 6 units for all her boluses without changing the dose for various meals  Also not using the CORRECTION bolus at any time and does not know how to enter her blood sugars at the time of boluses  Still continues to use large units of basal bolus per day and usually changing her pod daily She thinks she can count carbohydrates but is not adjusting her  mealtime bolus based on what she is eating She says she has taken her Trulicity and [place] as before Not able to exercise much   Side effects from medications have been:metformin caused GI side effects   CGM data  In general blood sugar data may be coming complete on some days because lack of monitoring late evenings and afternoons HYPERGLYCEMIC spikes are seen periodically midday and afternoon as well as sometimes late at night OVERNIGHT blood sugars are usually starting of on an average about 200 and generally declining till 8-9 AM but only occasionally continued to be high.  No hypoglycemia overnight HIGHEST blood sugars overall are in 4-5 PM but lowest blood sugars from 8-10 AM POSTPRANDIAL readings are variable with occasional significant rise in blood sugars midday with persistent continued hyperglycemia After dinnertime blood sugars are not clearly high but usually blood sugars are higher late at night after 9-10 PM No hypoglycemia   CGM use % of time 74  2-week average/GV 170/27  Time in range 53  % Time Above 180 43+4  % Time above 250   % Time Below 70      PRE-MEAL Fasting Lunch Dinner Bedtime Overall  Glucose range:       Averages: 129 188  207  197    POST-MEAL PC Breakfast PC Lunch PC Dinner  Glucose range:     Averages: 152 201 195     PREVIOUS patterns showing near normal blood sugars before 10 AM and higher at noon with average reading about 180-200 in the evenings between 6 PM-11 PM and mostly high sugars after 3 PM Time in target 50%  PRE-MEAL Morning Midday Evening Bedtime Overall  Glucose range:       Mean/median: 136 184 210 189 184+/-49     Meal times are: Bfst 7 AM, 1-2 PM and 5-6 PM                Dietician visit, most recent: 9/18 CDE visit: 01/2020 for pump start  Weight history: Previous range 219-230  Wt Readings from Last 3 Encounters:  03/26/21 265 lb 3.2 oz (120.3 kg)  02/14/21 262 lb 12.8 oz (119.2 kg)  01/10/21 264 lb 3.2 oz  (119.8 kg)    Glycemic control:    Lab Results  Component Value Date   HGBA1C 8.2 (A) 01/06/2021   HGBA1C 8.7 (H) 09/30/2020   HGBA1C 7.8 (H) 06/10/2020   Lab Results  Component Value Date   MICROALBUR 27.6 (H) 10/03/2020   LDLCALC 93 06/10/2020   CREATININE 0.80 12/31/2020   Lab Results  Component Value Date   MICRALBCREAT 21.4 10/03/2020    Lab Results  Component Value Date   FRUCTOSAMINE 255 04/09/2020   FRUCTOSAMINE 298 (H) 01/30/2020   FRUCTOSAMINE CANCELED 07/26/2015      Allergies as of 03/26/2021       Reactions   Codeine Swelling, Other (See Comments)   Swelling and burning of mouth (inside)   Lactose Intolerance (gi) Nausea And Vomiting        Medication List        Accurate as of March 26, 2021  3:26 PM. If you have any questions, ask your nurse or doctor.          Accu-Chek Guide test strip Generic drug: glucose blood Use as instructed to check blood sugar 4 times per day Dx code E11.9   albuterol 1.25 MG/3ML nebulizer solution Commonly known as: ACCUNEB Inhale 1 ampule into the lungs every 6 (six) hours as needed for wheezing or shortness of breath.   albuterol 108 (90 Base) MCG/ACT inhaler Commonly known as: ProAir HFA INHALE 2 PUFFS BY MOUTH EVERY 6 HOURS AS NEEDED FOR WHEEZING OR SHORTNESS OF BREATH   amitriptyline 25 MG tablet Commonly known as: ELAVIL Take 2-3 tablets (50-75 mg total) by mouth at bedtime.   amLODipine 10 MG tablet Commonly known as: NORVASC Take 1 tablet by mouth daily.   baclofen 10 MG tablet Commonly known as: LIORESAL Take 1 tablet (10 mg total) by mouth 3 (three) times daily.   budesonide 0.5 MG/2ML nebulizer solution Commonly known as: PULMICORT Inhale 0.5 mg into the lungs daily.   butalbital-acetaminophen-caffeine 50-325-40 MG tablet Commonly known as: FIORICET Take 1-2 tablets by mouth every 6 (six) hours as needed for headache.   carvedilol 25 MG tablet Commonly known as: COREG Take 25 mg by  mouth 2 (two) times daily.   cholecalciferol 25 MCG (1000 UNIT) tablet Commonly known as: VITAMIN D3 Take 1,000 Units by mouth daily.   ciclopirox 8 % solution Commonly known as: PENLAC Apply topically at bedtime.   cloNIDine 0.1 MG tablet Commonly known as: Catapres Take 1 tablet (0.1 mg total) by mouth 2 (two) times daily.   clopidogrel 75 MG tablet Commonly known as: PLAVIX Take 1 tablet (75 mg total) by  mouth daily.   cycloSPORINE 0.05 % ophthalmic emulsion Commonly known as: RESTASIS Place 2 drops into both eyes 2 (two) times daily.   DULoxetine 60 MG capsule Commonly known as: Cymbalta Take 1 capsule (60 mg total) by mouth daily.   EPINEPHrine 0.3 mg/0.3 mL Soaj injection Commonly known as: EPI-PEN Inject 0.3 mg into the muscle as needed for anaphylaxis.   ezetimibe 10 MG tablet Commonly known as: Zetia Take 1 tablet (10 mg total) by mouth daily.   Farxiga 10 MG Tabs tablet Generic drug: dapagliflozin propanediol TAKE 1 TABLET BY MOUTH EVERY DAY   fluticasone 50 MCG/ACT nasal spray Commonly known as: FLONASE Place 2 sprays into both nostrils daily.   FreeStyle Libre 2 Sensor Misc Change every 14 days   gabapentin 600 MG tablet Commonly known as: Neurontin 600 mg qAM, 600 mg qPM, 1200 mg QHS   hydrALAZINE 50 MG tablet Commonly known as: APRESOLINE Take 1 tablet by mouth 3 (three) times daily.   hydrochlorothiazide 25 MG tablet Commonly known as: HYDRODIURIL Take 25 mg by mouth daily.   insulin lispro 100 UNIT/ML injection Commonly known as: HumaLOG USE UP TO 150 UNITS/DAY IN INSULIN PUMP   levETIRAcetam 500 MG tablet Commonly known as: KEPPRA Take 500-750 mg by mouth See admin instructions. Take 1 tablet (530m) by mouth every morning and take 1 tablets (7550m by mouth every night   mirabegron ER 50 MG Tb24 tablet Commonly known as: Myrbetriq Take 1 tablet (50 mg total) by mouth daily.   nitroGLYCERIN 0.4 MG SL tablet Commonly known as:  NITROSTAT Place 0.4 mg under the tongue every 5 (five) minutes as needed for chest pain.   olmesartan-hydrochlorothiazide 40-25 MG tablet Commonly known as: BENICAR HCT TAKE 1 TABLET BY MOUTH EVERY DAY   Omnipod DASH Pods (Gen 4) Misc Change pod once daily   pantoprazole 20 MG tablet Commonly known as: PROTONIX Take 1 tablet (20 mg total) by mouth daily.   polyethylene glycol 17 g packet Commonly known as: MIRALAX / GLYCOLAX Take 17 g by mouth daily as needed for mild constipation.   potassium chloride SA 20 MEQ tablet Commonly known as: Klor-Con M20 You have not needed potassium while in the hospital.  Hold until outpatient followup. What changed:  how much to take how to take this when to take this additional instructions   rosuvastatin 10 MG tablet Commonly known as: CRESTOR Take 1 tablet (10 mg total) by mouth daily.   spironolactone 25 MG tablet Commonly known as: ALDACTONE Take 25 mg by mouth daily.   terconazole 0.4 % vaginal cream Commonly known as: TERAZOL 7 Place vaginally.   Trulicity 4.5 MGTM/1.9QQopn Generic drug: Dulaglutide Inject 4.5 mg as directed once a week.   Wixela Inhub 250-50 MCG/ACT Aepb Generic drug: fluticasone-salmeterol Inhale 1 puff into the lungs 2 (two) times daily.        Allergies:  Allergies  Allergen Reactions   Codeine Swelling and Other (See Comments)    Swelling and burning of mouth (inside)   Lactose Intolerance (Gi) Nausea And Vomiting    Past Medical History:  Diagnosis Date   Anxiety and depression 09/13/2006   Qualifier: Diagnosis of  By: MaHassell DoneNP, Nykedtra     Arthritis    joint pain    Asthma    COPD (chronic obstructive pulmonary disease) ([facility]   chronic bronchitis    Depression    Diabetes mellitus    since age [age]type 2 IDDM  Diabetic neuropathy, painful ([place])    FEET AND HANDS   Diabetic retinopathy ([place])    Diastolic dysfunction    a.  Echo 11/17: EF 60-65%, mild LVH, no RWMA, Gr1DD, mild  AI, dilated aortic root measuring 38 mm, mildly dilated ascending aorta   Dilated aortic root ([facility])    47m by echo 11/2015   Dizziness    secondary to diabetes and hypertension    Epilepsy idiopathic petit mal ([facility]    last seizure 2012;controlled w/ topomax   GERD (gastroesophageal reflux disease)    Glaucoma    NOT ON ANY EYE DROPS    Headache(784.0)    migraines    Heart murmur    born with    History of stress test    a. 11/17: Normal perfusion, EF 53%, normal study   Hx of blood clots    hematomas removed from left side of brain from 199moto [id number]    Hyperlipidemia    Hypertension    Legally blind    left eye    MIGRAINE HEADACHE 09/14/2006   Qualifier: Diagnosis of  By: MarHassell DoneP, Nykedtra     [facility]  a.  Patient reported history of without objective documentation   Nephrolithiasis    frequent urination , urination at nite  PT SEEN IN ER 09/22/11 FOR BACK AND RT SIDED PAIN--HAS KNOWN STONE RT URETER AND UA IN ER SHOWED UTI   Pain 09/23/11   LOWER BACK AND RIGHT SIDE--PT HAS RIGHT URETERAL STONE   Pneumonia    hx of 2009   Pyelonephritis    Seizures ([facility]  Sleep ap[facility]    [facility] ; mild   Stress incontinence    Stroke ([facility]  last 2003  RESIDUAL LEFT LEG WEAKNESS--NO OTHER RESIDUAL PROBLEMS    Past Surgical History:  Procedure Laterality Date   BRAIN HEMATOMA EVACUATION     five procedures total, first procedure when [age] monthsd, last at 4 y69ars of age.   CYSTOSCOPY W/ RETROGRADES  09/24/2011   Procedure: CYSTOSCOPY WITH RETROGRADE PYELOGRAM;  Surgeon: JohMalka SoD;  Location: [facility];  Service: Urology;  Laterality: Right;   CYSTOSCOPY WITH URETEROSCOPY  09/24/2011   Procedure: CYSTOSCOPY WITH URETEROSCOPY;  Surgeon: JohMalka SoD;  Location: [facility];  Service: Urology;  Laterality: Right;  Balloon dilation right ureter    CYSTOSCOPY/RETROGRADE/URETEROSCOPY  08/24/2011   Procedure:  CYSTOSCOPY/RETROGRADE/URETEROSCOPY;  Surgeon: DanMolli HazardD;  Location: [facility];  Service: Urology;  Laterality: Right;  Cysto, Right retrograde Pyelogram, right stent placement.    INCISIONAL HERNIA REPAIR  05/05/2011   Procedure: LAPAROSCOPIC INCISIONAL HERNIA;  Surgeon: MatRolm BookbinderD;  [facility];  Laterality: N/A;   kidney stone removal     MASS EXCISION  05/05/2011   Procedure: EXCISION MASS;  Surgeon: MatRolm BookbinderD;  [facility];  Laterality: Right;   [facility]     RETINAL DETACHMENT SURGERY Left 1990   RIGHT/LEFT HEART CATH AND CORONARY ANGIOGRAPHY N/A 04/28/2019   Procedure: RIGHT/LEFT HEART CATH AND CORONARY ANGIOGRAPHY;  Surgeon: CalYolonda KidaD;  [facility];  Service: Cardiovascular;  Laterality: N/A;   SHUNT REMOVAL     shunt inserted at age 61 r68moved [place] 8 77 [place] Family [place]  Problem Relation Age of Onset  Uterine cancer Mother    Hypertension Mother    Cancer Mother    Brain cancer Maternal Grandmother    Hypertension Maternal Grandmother    Birth defects Daughter    Hypertension Daughter    Cirrhosis Maternal Grandfather    ADD / ADHD Maternal Grandfather    Birth defects Maternal Grandfather    Diabetes Maternal Grandfather    Anesthesia problems Neg Hx    Colon cancer Neg Hx    Esophageal cancer Neg Hx    Pancreatic cancer Neg Hx     Social History:  reports that she quit smoking about 34 years ago. Her smoking use included cigarettes. She has a 15.00 pack-year smoking history. She has never used smokeless tobacco. She reports current alcohol use of about 1.0 standard drink per week. She reports that she does not use drugs.   Review of Systems   Lipid history: Is being treated with simvastatin 40 mg daily    Lab Results  Component Value Date   CHOL 169 06/10/2020   HDL 41.10 06/10/2020   LDLCALC 93 06/10/2020   TRIG 172.0 (H)  06/10/2020   CHOLHDL 4 06/10/2020           Hypertension: She appears to have severe hypertension which is more recently poorly controlled She says she has been managed by her cardiologist on a regular basis   Currently taking Coreg, Losartan 100 mg, hydralazine 25 mg 3 times daily, amlodipine, aldactone 25 mg, HCTZ per cardiologist  She does have an adrenal adenoma  Not sure if she has had evaluation for this elsewhere Previously in the hospital her aldosterone level was normal  BP Readings from Last 3 Encounters:  03/26/21 (!) 142/100  02/14/21 130/80  01/10/21 (!) 174/100   Hypokalemia: She has been given  25 mg spironolactone for history of hypokalemia and no potassium supplements  Lab Results  Component Value Date   K 3.9 12/31/2020    Diabetic complications: History of neuropathy, microalbuminuria and background retinopathy  She will occasionally take prednisone for her asthma  LABS:  No visits with results within 1 Week(s) from this visit.  Latest known visit with results is:  Office Visit on 01/06/2021  Component Date Value Ref Range Status   Hemoglobin A1C 01/06/2021 8.2 (A)  4.0 - 5.6 % Final    Physical Examination:  BP (!) 142/100    Pulse 85    Ht 5' 6"  (1.676 m)    Wt 265 lb 3.2 oz (120.3 kg)    LMP  (LMP Unknown)    SpO2 97%    BMI 42.80 kg/m       ASSESSMENT:  Diabetes type 2, uncontrolled, insulin-dependent  See history of present illness for detailed discussion of current diabetes management, blood sugar patterns and problems identified     Her A1c is last better at 8.2 although has been below 8 in the past  She is on insulin pump since 1/22 using Humalog with Dash OmniPod pump Also on Farxiga and Trulicity 3 mg weekly  Her blood sugars were assessed from the freestyle libre which is not available She is still quite insulin resistant requiring about 130 units only and basal insulin Still her blood sugars are on the sensor averaging 175 with  only 53% readings within target  She is not adjusting her mealtime bolus based on what she is eating or her blood sugar level   Hypertension: Still not consistently controlled   PLAN:    She was  again shown by the nurse educator how to do her boluses properly including mealtime boluses and correction for high blood sugars She will need to increase her maximum BOLUS more than 6 units and preferably 15 New BASAL settings: 12 AM = 5.2, 3 AM = 4.8, 9 AM = 5.4, 3 PM = 6.0 SENSITIVITY 1: 30 TARGET blood sugar 120  When she finishes her Humalog she will start using HUMALOG U-200 concentration If she needs help starting this with her pump she will see the nurse educator With this her new basal rates will be 50% of the above as follows 12 AM = 2.6, 3 AM = 2.4, 9 AM = 2.7, 3 PM = 3.0 Sensitivity will go back to 1: 50 BOLUS amounts will be 50% of previous  She will need to make sure she adds her blood sugar when she is having her bolus Adjust bolus amounts based on what she is eating between 6-8 units currently  Continue Trulicity and Farxiga  There are no Patient Instructions on file for this visit.   Total visit time including counseling = 30 minutes  Elayne Snare 03/26/2021, 3:26 PM   Note: This office note was prepared with Dragon voice recognition system technology. Any transcriptional errors that result from this process are unintentional.

## 2021-04-06 ENCOUNTER — Other Ambulatory Visit: Payer: Self-pay | Admitting: Family Medicine

## 2021-04-07 ENCOUNTER — Telehealth: Payer: Self-pay | Admitting: Nutrition

## 2021-04-07 NOTE — Telephone Encounter (Signed)
03/31/21.  Pt. Was talked through making changes to her bolus limit.  This was changed from 6 to 15 per Dr. Ronnie Derby request ?

## 2021-04-08 ENCOUNTER — Other Ambulatory Visit: Payer: Self-pay | Admitting: Family Medicine

## 2021-04-08 DIAGNOSIS — I69359 Hemiplegia and hemiparesis following cerebral infarction affecting unspecified side: Secondary | ICD-10-CM

## 2021-04-08 DIAGNOSIS — M533 Sacrococcygeal disorders, not elsewhere classified: Secondary | ICD-10-CM

## 2021-04-08 DIAGNOSIS — R29898 Other symptoms and signs involving the musculoskeletal system: Secondary | ICD-10-CM

## 2021-04-08 DIAGNOSIS — E1142 Type 2 diabetes mellitus with diabetic polyneuropathy: Secondary | ICD-10-CM

## 2021-04-09 ENCOUNTER — Telehealth: Payer: Self-pay

## 2021-04-09 NOTE — Telephone Encounter (Signed)
Copied from [place] [id number]. Topic: General - Other ?>> Apr 09, 2021  1:24 PM Tessa Lerner A wrote: ?Reason for CRM: Tiffany with [facility] has called to request an extension of patient's physical therapy evaluation  ? ?Clinicians would like to see the patient on 04/11/21 ? ?Please contact further for the ok ?

## 2021-04-09 NOTE — Telephone Encounter (Signed)
Recent referral was submitted by you Dr.Rumball do you approve? ?

## 2021-04-09 NOTE — Telephone Encounter (Signed)
Tiffany with [facility] has been informed, per Dr.Rumball it is ok for further PT evaluation. ?

## 2021-04-11 ENCOUNTER — Ambulatory Visit: Payer: Self-pay

## 2021-04-11 ENCOUNTER — Other Ambulatory Visit: Payer: Self-pay

## 2021-04-11 DIAGNOSIS — E1169 Type 2 diabetes mellitus with other specified complication: Secondary | ICD-10-CM

## 2021-04-11 NOTE — Telephone Encounter (Signed)
? ?  Chief Complaint: elevated B/P reading with PT ?Symptoms: BP with PT was 180/120 - when I called 140/80 ?Frequency: Ongoing ?Pertinent Negatives: Patient denies Lightheadedness, SOB ?Disposition: [] ED /[] Urgent Care (no appt availability in office) / [] Appointment(In office/virtual)/ []  [facility]/ [] Home Care/ [] Refused Recommended Disposition /[]  Mobile Bus/ [x]  Follow-up with PCP ?Additional Notes: PT, [facility] called. Pt had BP of 180/120 when she was there. Also pt was unsure what medications she is supposed to be taking. Unsure if pt should be seen. Pt does need a call back to review medications. ? ? ?Received call from [facility] PT regarding  high BP reading and medication questions. Called pt back. ? ?Reason for Disposition ? [9] Systolic BP  >= 728 OR Diastolic >= 80 AND [2] taking BP medications ? ?Answer Assessment - Initial Assessment Questions ?1. BLOOD PRESSURE: "What is the blood pressure?" "Did you take at least two measurements 5 minutes apart?" ?    180/120 - no ?2. ONSET: "When did you take your blood pressure?" ?    1 hour ago ?3. HOW: "How did you obtain the blood pressure?" (e.g., visiting nurse, automatic home BP monitor) ?    manual ?4. HISTORY: "Do you have a history of high blood pressure?" ?    yes ?5. MEDICATIONS: "Are you taking any medications for blood pressure?" "Have you missed any doses recently?" ?    no ?6. OTHER SYMPTOMS: "Do you have any symptoms?" (e.g., headache, chest pain, blurred vision, difficulty breathing, weakness) ?    no ?7. PREGNANCY: "Is there any chance you are pregnant?" "When was your last menstrual period?" ?    na ? ?Protocols used: Blood Pressure - High-A-AH ? ?

## 2021-04-11 NOTE — Telephone Encounter (Signed)
Copied from [place] [id number]. Topic: Quick Communication - Home Health Verbal Orders >> Apr 11, 2021  3:21 PM Tessa Lerner A wrote: Caller/Agency: [facility] / [facility]  Callback Number:  [phone] Requesting OT/PT/Skilled Nursing/Social Work/Speech Therapy: OT Frequency: Evaluation  Requesting OT/PT/Skilled Nursing/Social Work/Speech Therapy: Social Work  Frequency: Evaluation

## 2021-04-14 MED ORDER — EZETIMIBE 10 MG PO TABS: 10.0000 mg | ORAL_TABLET | Freq: Every day | ORAL | 0 refills | Status: DC

## 2021-04-14 NOTE — Telephone Encounter (Signed)
Verbal given 

## 2021-04-14 NOTE — Addendum Note (Signed)
Addended by: Docia Furl on: 04/14/2021 11:36 AM ? ? Modules accepted: Orders ? ?

## 2021-04-15 ENCOUNTER — Telehealth: Payer: Self-pay | Admitting: Family Medicine

## 2021-04-15 NOTE — Telephone Encounter (Signed)
[facility] Education officer, museum called and stated that no further orders are needed for social work for this pt/  ?

## 2021-04-16 ENCOUNTER — Telehealth: Payer: Self-pay | Admitting: Family Medicine

## 2021-04-16 NOTE — Telephone Encounter (Signed)
Home Health Verbal Orders - Caller/Agency:[facility] ?[place] Number:[phone] ?Requesting OT ?Frequency: 1x1 2x2 ?

## 2021-04-17 NOTE — Telephone Encounter (Signed)
VO given to [facility] ?

## 2021-04-17 NOTE — Telephone Encounter (Signed)
Called [facility] back no answer left vm to call back ?

## 2021-04-22 ENCOUNTER — Emergency Department: Payer: Medicare Other

## 2021-04-22 ENCOUNTER — Encounter: Payer: Self-pay | Admitting: Radiology

## 2021-04-22 ENCOUNTER — Other Ambulatory Visit: Payer: Self-pay

## 2021-04-22 ENCOUNTER — Inpatient Hospital Stay
Admission: EM | Admit: 2021-04-22 | Discharge: 2021-05-01 | DRG: 389 | Disposition: A | Discharge status: Home Health | Payer: Medicare Other | Attending: Family Medicine | Admitting: Family Medicine

## 2021-04-22 DIAGNOSIS — G8929 Other chronic pain: Secondary | ICD-10-CM | POA: Diagnosis present

## 2021-04-22 DIAGNOSIS — I252 Old myocardial infarction: Secondary | ICD-10-CM

## 2021-04-22 DIAGNOSIS — Z7985 Long-term (current) use of injectable non-insulin antidiabetic drugs: Secondary | ICD-10-CM

## 2021-04-22 DIAGNOSIS — H548 Legal blindness, as defined in USA: Secondary | ICD-10-CM | POA: Diagnosis present

## 2021-04-22 DIAGNOSIS — E739 Lactose intolerance, unspecified: Secondary | ICD-10-CM | POA: Diagnosis present

## 2021-04-22 DIAGNOSIS — Z86718 Personal history of other venous thrombosis and embolism: Secondary | ICD-10-CM

## 2021-04-22 DIAGNOSIS — G4733 Obstructive sleep apnea (adult) (pediatric): Secondary | ICD-10-CM | POA: Diagnosis present

## 2021-04-22 DIAGNOSIS — Z9641 Presence of insulin pump (external) (internal): Secondary | ICD-10-CM | POA: Diagnosis present

## 2021-04-22 DIAGNOSIS — D75839 Thrombocytosis, unspecified: Secondary | ICD-10-CM | POA: Diagnosis present

## 2021-04-22 DIAGNOSIS — R569 Unspecified convulsions: Secondary | ICD-10-CM

## 2021-04-22 DIAGNOSIS — F419 Anxiety disorder, unspecified: Secondary | ICD-10-CM | POA: Diagnosis present

## 2021-04-22 DIAGNOSIS — K5900 Constipation, unspecified: Secondary | ICD-10-CM | POA: Diagnosis present

## 2021-04-22 DIAGNOSIS — I110 Hypertensive heart disease with heart failure: Secondary | ICD-10-CM | POA: Diagnosis present

## 2021-04-22 DIAGNOSIS — Z794 Long term (current) use of insulin: Secondary | ICD-10-CM

## 2021-04-22 DIAGNOSIS — I5032 Chronic diastolic (congestive) heart failure: Secondary | ICD-10-CM | POA: Diagnosis present

## 2021-04-22 DIAGNOSIS — F32A Depression, unspecified: Secondary | ICD-10-CM | POA: Diagnosis present

## 2021-04-22 DIAGNOSIS — Z90710 Acquired absence of both cervix and uterus: Secondary | ICD-10-CM

## 2021-04-22 DIAGNOSIS — Z8701 Personal history of pneumonia (recurrent): Secondary | ICD-10-CM

## 2021-04-22 DIAGNOSIS — I77810 Thoracic aortic ectasia: Secondary | ICD-10-CM | POA: Diagnosis present

## 2021-04-22 DIAGNOSIS — I160 Hypertensive urgency: Secondary | ICD-10-CM | POA: Diagnosis present

## 2021-04-22 DIAGNOSIS — Z87891 Personal history of nicotine dependence: Secondary | ICD-10-CM

## 2021-04-22 DIAGNOSIS — H409 Unspecified glaucoma: Secondary | ICD-10-CM | POA: Diagnosis present

## 2021-04-22 DIAGNOSIS — Z7902 Long term (current) use of antithrombotics/antiplatelets: Secondary | ICD-10-CM

## 2021-04-22 DIAGNOSIS — I951 Orthostatic hypotension: Secondary | ICD-10-CM | POA: Diagnosis not present

## 2021-04-22 DIAGNOSIS — K5651 Intestinal adhesions [bands], with partial obstruction: Secondary | ICD-10-CM | POA: Diagnosis not present

## 2021-04-22 DIAGNOSIS — M549 Dorsalgia, unspecified: Secondary | ICD-10-CM | POA: Diagnosis present

## 2021-04-22 DIAGNOSIS — K219 Gastro-esophageal reflux disease without esophagitis: Secondary | ICD-10-CM | POA: Diagnosis present

## 2021-04-22 DIAGNOSIS — G40909 Epilepsy, unspecified, not intractable, without status epilepticus: Secondary | ICD-10-CM

## 2021-04-22 DIAGNOSIS — R1084 Generalized abdominal pain: Secondary | ICD-10-CM

## 2021-04-22 DIAGNOSIS — G40A09 Absence epileptic syndrome, not intractable, without status epilepticus: Secondary | ICD-10-CM

## 2021-04-22 DIAGNOSIS — G40802 Other epilepsy, not intractable, without status epilepticus: Secondary | ICD-10-CM | POA: Diagnosis present

## 2021-04-22 DIAGNOSIS — K56609 Unspecified intestinal obstruction, unspecified as to partial versus complete obstruction: Secondary | ICD-10-CM | POA: Diagnosis present

## 2021-04-22 DIAGNOSIS — Z6841 Body Mass Index (BMI) 40.0 and over, adult: Secondary | ICD-10-CM

## 2021-04-22 DIAGNOSIS — Z79899 Other long term (current) drug therapy: Secondary | ICD-10-CM

## 2021-04-22 DIAGNOSIS — E869 Volume depletion, unspecified: Secondary | ICD-10-CM | POA: Diagnosis present

## 2021-04-22 DIAGNOSIS — Z885 Allergy status to narcotic agent status: Secondary | ICD-10-CM

## 2021-04-22 DIAGNOSIS — E785 Hyperlipidemia, unspecified: Secondary | ICD-10-CM | POA: Diagnosis present

## 2021-04-22 DIAGNOSIS — Z87442 Personal history of urinary calculi: Secondary | ICD-10-CM

## 2021-04-22 DIAGNOSIS — J449 Chronic obstructive pulmonary disease, unspecified: Secondary | ICD-10-CM | POA: Diagnosis present

## 2021-04-22 DIAGNOSIS — M1990 Unspecified osteoarthritis, unspecified site: Secondary | ICD-10-CM | POA: Diagnosis present

## 2021-04-22 DIAGNOSIS — Z9851 Tubal ligation status: Secondary | ICD-10-CM

## 2021-04-22 DIAGNOSIS — E860 Dehydration: Secondary | ICD-10-CM | POA: Diagnosis present

## 2021-04-22 DIAGNOSIS — Z833 Family history of diabetes mellitus: Secondary | ICD-10-CM

## 2021-04-22 DIAGNOSIS — E66813 Obesity, class 3: Secondary | ICD-10-CM

## 2021-04-22 DIAGNOSIS — Z8673 Personal history of transient ischemic attack (TIA), and cerebral infarction without residual deficits: Secondary | ICD-10-CM

## 2021-04-22 DIAGNOSIS — E1140 Type 2 diabetes mellitus with diabetic neuropathy, unspecified: Secondary | ICD-10-CM | POA: Diagnosis present

## 2021-04-22 DIAGNOSIS — E11319 Type 2 diabetes mellitus with unspecified diabetic retinopathy without macular edema: Secondary | ICD-10-CM | POA: Diagnosis present

## 2021-04-22 DIAGNOSIS — Z7951 Long term (current) use of inhaled steroids: Secondary | ICD-10-CM

## 2021-04-22 DIAGNOSIS — E669 Obesity, unspecified: Secondary | ICD-10-CM | POA: Diagnosis present

## 2021-04-22 DIAGNOSIS — Z8249 Family history of ischemic heart disease and other diseases of the circulatory system: Secondary | ICD-10-CM

## 2021-04-22 DIAGNOSIS — N179 Acute kidney failure, unspecified: Secondary | ICD-10-CM | POA: Diagnosis present

## 2021-04-22 LAB — COMPREHENSIVE METABOLIC PANEL
ALT: 26 U/L (ref 0–44)
AST: 22 U/L (ref 15–41)
Albumin: 4.9 g/dL (ref 3.5–5.0)
Alkaline Phosphatase: 97 U/L (ref 38–126)
Anion gap: 16 — ABNORMAL HIGH (ref 5–15)
BUN: 27 mg/dL — ABNORMAL HIGH (ref 6–20)
CO2: 26 mmol/L (ref 22–32)
Calcium: 10.6 mg/dL — ABNORMAL HIGH (ref 8.9–10.3)
Chloride: 95 mmol/L — ABNORMAL LOW (ref 98–111)
Creatinine, Ser: 1.56 mg/dL — ABNORMAL HIGH (ref 0.44–1.00)
GFR, Estimated: 39 mL/min — ABNORMAL LOW (ref >60)
Glucose, Bld: 195 mg/dL — ABNORMAL HIGH (ref 70–99)
Potassium: 4 mmol/L (ref 3.5–5.1)
Sodium: 137 mmol/L (ref 135–145)
Total Bilirubin: 0.6 mg/dL (ref 0.3–1.2)
Total Protein: 10 g/dL — ABNORMAL HIGH (ref 6.5–8.1)

## 2021-04-22 LAB — LIPASE, BLOOD: Lipase: 28 U/L (ref 11–51)

## 2021-04-22 LAB — TYPE AND SCREEN
ABO/RH(D): B POS
Antibody Screen: NEGATIVE

## 2021-04-22 LAB — CBC
HCT: 49.8 % — ABNORMAL HIGH (ref 36.0–46.0)
Hemoglobin: 16 g/dL — ABNORMAL HIGH (ref 12.0–15.0)
MCH: 28.2 pg (ref 26.0–34.0)
MCHC: 32.1 g/dL (ref 30.0–36.0)
MCV: 87.8 fL (ref 80.0–100.0)
Platelets: 405 10*3/uL — ABNORMAL HIGH (ref 150–400)
RBC: 5.67 MIL/uL — ABNORMAL HIGH (ref 3.87–5.11)
RDW: 14.2 % (ref 11.5–15.5)
WBC: 12.5 10*3/uL — ABNORMAL HIGH (ref 4.0–10.5)
nRBC: 0 % (ref 0.0–0.2)

## 2021-04-22 LAB — CBG MONITORING, ED: Glucose-Capillary: 234 mg/dL — ABNORMAL HIGH (ref 70–99)

## 2021-04-22 MED ORDER — PANTOPRAZOLE SODIUM 40 MG IV SOLR
40.0000 mg | Freq: Once | INTRAVENOUS | Status: AC
  Administered 2021-04-22: 40 mg via INTRAVENOUS
  Filled 2021-04-22: qty 10

## 2021-04-22 MED ORDER — MORPHINE SULFATE (PF) 4 MG/ML IV SOLN
4.0000 mg | Freq: Once | INTRAVENOUS | Status: AC
  Administered 2021-04-22: 4 mg via INTRAVENOUS
  Filled 2021-04-22: qty 1

## 2021-04-22 MED ORDER — IOHEXOL 300 MG/ML  SOLN
80.0000 mL | Freq: Once | INTRAMUSCULAR | Status: AC | PRN
  Administered 2021-04-22: 80 mL via INTRAVENOUS

## 2021-04-22 MED ORDER — ONDANSETRON 4 MG PO TBDP
4.0000 mg | ORAL_TABLET | Freq: Once | ORAL | Status: AC | PRN
Start: 2021-04-22 — End: 2021-04-22
  Administered 2021-04-22: 4 mg via ORAL
  Filled 2021-04-22: qty 1

## 2021-04-22 MED ORDER — SODIUM CHLORIDE 0.9 % IV SOLN: INTRAVENOUS | Status: DC

## 2021-04-22 MED ORDER — ONDANSETRON HCL 4 MG/2ML IJ SOLN
4.0000 mg | Freq: Once | INTRAMUSCULAR | Status: AC
  Administered 2021-04-22: 4 mg via INTRAVENOUS
  Filled 2021-04-22: qty 2

## 2021-04-22 MED ORDER — SODIUM CHLORIDE 0.9 % IV BOLUS (SEPSIS)
1000.0000 mL | Freq: Once | INTRAVENOUS | Status: AC
  Administered 2021-04-22: 1000 mL via INTRAVENOUS

## 2021-04-22 MED ORDER — SODIUM CHLORIDE 0.9 % IV BOLUS
500.0000 mL | Freq: Once | INTRAVENOUS | Status: AC
  Administered 2021-04-22: 500 mL via INTRAVENOUS

## 2021-04-22 NOTE — ED Triage Notes (Signed)
Vomiting coffee ground emesis, starting this morning. Apx 8 episodes of vomiting. Unable to tolerate oral intake. Hx of SBO apx 1 yr ago, reports sx feel similar.C/o Right flank pain and cold sensation to throat.  ?

## 2021-04-22 NOTE — ED Provider Notes (Signed)
? ?[facility] ?Provider Note ? ? ? Event Date/Time  ? First MD Initiated Contact with Patient 04/22/21 2304   ?  (approximate) ? ? ?History  ? ?Abdominal Pain ? ? ?HPI ? ?[name] is a 56 y.o. female with history of obesity, CVA on Plavix, hypertension, diabetes, hyperlipidemia, diastolic heart failure, COPD who presents to the emergency department generalized abdominal pain, nausea and vomiting that started today.  States she did have a bowel movement earlier today that was black.  Now she states that she is not able to have bowel movements or pass gas.  She also reports she has vomiting emesis that looks dark.  She does not notice any bright red blood.  She denies history of previous GI bleed.  Told nursing staff this felt like her previous bowel obstructions.  She has had previous hernia repair and BTL.  No fever.  Feels like her abdomen is distended. ? ? ?History provided by patient. ? ? ? ?Past Medical History:  ?Diagnosis Date  ? Anxiety and depression 09/13/2006  ? Qualifier: Diagnosis of  By: Hassell Done [name], Tori Milks    ? Arthritis   ? joint pain   ? Asthma   ? COPD (chronic obstructive pulmonary disease) ([place])   ? chronic bronchitis   ? Depression   ? Diabetes mellitus   ? since age 54; type 2 IDDM  ? Diabetic neuropathy, painful ([place])   ? FEET AND HANDS  ? Diabetic retinopathy ([place])   ? Diastolic dysfunction   ? a.  Echo 11/17: EF 60-65%, mild LVH, no RWMA, Gr1DD, mild AI, dilated aortic root measuring 38 mm, mildly dilated ascending aorta  ? Dilated aortic root ([place])   ? 27m by echo 11/2015  ? Dizziness   ? secondary to diabetes and hypertension   ? Epilepsy idiopathic petit mal ([place]   ? last seizure 2012;controlled w/ topomax  ? GERD (gastroesophageal reflux disease)   ? Glaucoma   ? NOT ON ANY EYE DROPS   ? Headache(784.0)   ? migraines   ? Heart murmur   ? born with   ? History of stress test   ? a. 11/17: Normal perfusion, EF 53%, normal study  ? Hx of blood clots   ?  hematomas removed from left side of brain from 138moto 5y80yrld   ? Hyperlipidemia   ? Hypertension   ? Legally blind   ? left eye   ? MIGRAINE HEADACHE 09/14/2006  ? Qualifier: Diagnosis of  By: MarHassell DoneP, NykTori Milks ? [facility] ([facility] ? a.  Patient reported history of without objective documentation  ? Nephrolithiasis   ? frequent urination , urination at nite  PT SEEN IN ER 09/22/11 FOR BACK AND RT SIDED PAIN--HAS KNOWN STONE RT URETER AND UA IN ER SHOWED UTI  ? Pain 09/23/11  ? LOWER BACK AND RIGHT SIDE--PT HAS RIGHT URETERAL STONE  ? Pneumonia   ? hx of 2009  ? Pyelonephritis   ? Seizures ([facility] ? Sleep ap[facility]   [facility] ; mild  ? Stress incontinence   ? [facility]  RESIDUAL LEFT LEG WEAKNESS--NO OTHER RESIDUAL PROBLEMS  ? ? ?Past Surgical History:  ?Procedure Laterality Date  ? BRAIN HEMATOMA EVACUATION    ? five procedures total, first procedure when [age]nths old, last at 4 y77ars of age.  ? CYSTOSCOPY W/  RETROGRADES  09/24/2011  ? Procedure: CYSTOSCOPY WITH RETROGRADE PYELOGRAM;  Surgeon: Malka So, MD;  [facility];  Service: Urology;  Laterality: Right;  ? CYSTOSCOPY WITH URETEROSCOPY  09/24/2011  ? Procedure: CYSTOSCOPY WITH URETEROSCOPY;  Surgeon: Malka So, MD;  [facility];  Service: Urology;  Laterality: Right;  Balloon dilation right ureter ?  ? CYSTOSCOPY/RETROGRADE/URETEROSCOPY  08/24/2011  ? Procedure: CYSTOSCOPY/RETROGRADE/URETEROSCOPY;  Surgeon: Molli Hazard, MD;  [facility];  Service: Urology;  Laterality: Right;  Cysto, Right retrograde Pyelogram, right stent placement.   ? INCISIONAL HERNIA REPAIR  05/05/2011  ? Procedure: LAPAROSCOPIC INCISIONAL HERNIA;  Surgeon: Rolm Bookbinder, MD;  Location: [place];  Service: General;  Laterality: N/A;  ? kidney stone removal    ? MASS EXCISION  05/05/2011  ? Procedure: EXCISION MASS;  Surgeon: Rolm Bookbinder, MD;  Location: [place];  Service: General;   Laterality: Right;  ? PCNL    ? RETINAL DETACHMENT SURGERY Left 1990  ? RIGHT/LEFT HEART CATH AND CORONARY ANGIOGRAPHY N/A 04/28/2019  ? Procedure: RIGHT/LEFT HEART CATH AND CORONARY ANGIOGRAPHY;  Surgeon: Yolonda Kida, MD;  Location: [place] [facility] LAB;  Service: Cardiovascular;  Laterality: N/A;  ? SHUNT REMOVAL    ? shunt inserted at age 68 removed at age 63   ? VAGINAL HYSTERECTOMY  1996  ? ? ?MEDICATIONS:  ?Prior to Admission medications   ?Medication Sig Start Date End Date Taking? Authorizing Provider  ?albuterol (ACCUNEB) 1.25 MG/3ML nebulizer solution Inhale 1 ampule into the lungs every 6 (six) hours as needed for wheezing or shortness of breath.    [provider]  ?albuterol (PROAIR HFA) 108 (90 Base) MCG/ACT inhaler INHALE 2 PUFFS BY MOUTH EVERY 6 HOURS AS NEEDED FOR WHEEZING OR SHORTNESS OF BREATH 01/24/18   Hubbard Hartshorn, [name]  ?amitriptyline (ELAVIL) 25 MG tablet Take 2-3 tablets (50-75 mg total) by mouth at bedtime. 01/07/21 07/06/21  Gillis Santa, MD  ?amLODipine (NORVASC) 10 MG tablet Take 1 tablet by mouth daily. 12/26/20 12/26/21  [provider]  ?baclofen (LIORESAL) 10 MG tablet Take 1 tablet (10 mg total) by mouth 3 (three) times daily. 03/18/21   Mecum, Erin E, [name]  ?budesonide (PULMICORT) 0.5 MG/2ML nebulizer solution Inhale 0.5 mg into the lungs daily.    Ottie Glazier, MD  ?butalbital-acetaminophen-caffeine [facility]) [phone] MG tablet Take 1-2 tablets by mouth every 6 (six) hours as needed for headache. 03/10/21 03/10/22  Gillis Santa, MD  ?carvedilol (COREG) 25 MG tablet Take 25 mg by mouth 2 (two) times daily. 03/06/21   [provider]  ?cholecalciferol (VITAMIN D3) 25 MCG (1000 UNIT) tablet Take 1,000 Units by mouth daily.    [provider]  ?ciclopirox (PENLAC) 8 % solution Apply topically at bedtime. 02/01/21   [provider]  ?cloNIDine (CATAPRES) 0.1 MG tablet Take 1 tablet (0.1 mg total) by mouth 2 (two) times daily. 12/31/20  12/31/21  Vladimir Crofts, MD  ?clopidogrel (PLAVIX) 75 MG tablet Take 1 tablet (75 mg total) by mouth daily. 10/10/19   Towanda Malkin, MD  ?Continuous Blood Gluc Sensor (FREESTYLE LIBRE 2 SENSOR) MISC Change every 14 days 01/30/21   Elayne Snare, MD  ?cycloSPORINE (RESTASIS) 0.05 % ophthalmic emulsion Place 2 drops into both eyes 2 (two) times daily.    [provider]  ?Dulaglutide (TRULICITY) 4.5 LZ/7.6BH SOPN Inject 4.5 mg as directed once a week. 10/22/20   Elayne Snare, MD  ?DULoxetine (CYMBALTA) 60 MG capsule Take 1 capsule (  60 mg total) by mouth daily. 02/14/21   Bo Merino, [name]  ?EPINEPHrine 0.3 mg/0.3 mL IJ SOAJ injection Inject 0.3 mg into the muscle as needed for anaphylaxis. 03/29/19   [provider]  ?ezetimibe (ZETIA) 10 MG tablet Take 1 tablet (10 mg total) by mouth daily. 04/14/21   Bo Merino, [name]  ?FARXIGA 10 MG TABS tablet TAKE 1 TABLET BY MOUTH EVERY DAY 09/30/20   Elayne Snare, MD  ?fluticasone (FLONASE) 50 MCG/ACT nasal spray Place 2 sprays into both nostrils daily. 04/18/19   Towanda Malkin, MD  ?gabapentin (NEURONTIN) 600 MG tablet 600 mg qAM, 600 mg qPM, 1200 mg QHS 01/07/21   Gillis Santa, MD  ?glucose blood (ACCU-CHEK GUIDE) test strip Use as instructed to check blood sugar 4 times per day Dx code E11.9 05/27/20   Elayne Snare, MD  ?hydrALAZINE (APRESOLINE) 50 MG tablet Take 1 tablet by mouth 3 (three) times daily. 01/31/21   [provider]  ?hydrochlorothiazide (HYDRODIURIL) 25 MG tablet Take 25 mg by mouth daily. 01/15/21   [provider]  ?Insulin Disposable Pump (OMNIPOD DASH PODS, GEN 4,) MISC Change pod once daily 01/28/21   Elayne Snare, MD  ?insulin lispro (HUMALOG KWIKPEN) 200 UNIT/ML KwikPen Inject up to 160 units daily in divided doses as directed 03/26/21   Elayne Snare, MD  ?levETIRAcetam (KEPPRA) 500 MG tablet Take 500-750 mg by mouth See admin instructions. Take 1 tablet (569m) by mouth every morning and take 1? tablets (7559m  by mouth every night    [provider]  ?mirabegron ER (MYRBETRIQ) 50 MG TB24 tablet Take 1 tablet (50 mg total) by mouth daily. 08/20/20   RuMyles GipDO  ?nitroGLYCERIN (NITROSTAT) 0.4 MG SL tabl

## 2021-04-23 ENCOUNTER — Inpatient Hospital Stay: Payer: Medicare Other

## 2021-04-23 ENCOUNTER — Encounter: Payer: Self-pay | Admitting: Family Medicine

## 2021-04-23 DIAGNOSIS — J449 Chronic obstructive pulmonary disease, unspecified: Secondary | ICD-10-CM | POA: Diagnosis present

## 2021-04-23 DIAGNOSIS — E11319 Type 2 diabetes mellitus with unspecified diabetic retinopathy without macular edema: Secondary | ICD-10-CM | POA: Diagnosis present

## 2021-04-23 DIAGNOSIS — Z6841 Body Mass Index (BMI) 40.0 and over, adult: Secondary | ICD-10-CM | POA: Diagnosis not present

## 2021-04-23 DIAGNOSIS — N179 Acute kidney failure, unspecified: Secondary | ICD-10-CM | POA: Diagnosis present

## 2021-04-23 DIAGNOSIS — K5900 Constipation, unspecified: Secondary | ICD-10-CM | POA: Diagnosis present

## 2021-04-23 DIAGNOSIS — G40909 Epilepsy, unspecified, not intractable, without status epilepticus: Secondary | ICD-10-CM

## 2021-04-23 DIAGNOSIS — I5032 Chronic diastolic (congestive) heart failure: Secondary | ICD-10-CM | POA: Diagnosis present

## 2021-04-23 DIAGNOSIS — G4733 Obstructive sleep apnea (adult) (pediatric): Secondary | ICD-10-CM | POA: Diagnosis present

## 2021-04-23 DIAGNOSIS — K56609 Unspecified intestinal obstruction, unspecified as to partial versus complete obstruction: Secondary | ICD-10-CM | POA: Diagnosis present

## 2021-04-23 DIAGNOSIS — F419 Anxiety disorder, unspecified: Secondary | ICD-10-CM | POA: Diagnosis present

## 2021-04-23 DIAGNOSIS — K219 Gastro-esophageal reflux disease without esophagitis: Secondary | ICD-10-CM | POA: Diagnosis present

## 2021-04-23 DIAGNOSIS — I110 Hypertensive heart disease with heart failure: Secondary | ICD-10-CM | POA: Diagnosis present

## 2021-04-23 DIAGNOSIS — E1140 Type 2 diabetes mellitus with diabetic neuropathy, unspecified: Secondary | ICD-10-CM | POA: Diagnosis present

## 2021-04-23 DIAGNOSIS — E785 Hyperlipidemia, unspecified: Secondary | ICD-10-CM | POA: Diagnosis present

## 2021-04-23 DIAGNOSIS — I160 Hypertensive urgency: Secondary | ICD-10-CM

## 2021-04-23 DIAGNOSIS — G8929 Other chronic pain: Secondary | ICD-10-CM | POA: Diagnosis present

## 2021-04-23 DIAGNOSIS — E869 Volume depletion, unspecified: Secondary | ICD-10-CM | POA: Diagnosis present

## 2021-04-23 DIAGNOSIS — K5651 Intestinal adhesions [bands], with partial obstruction: Secondary | ICD-10-CM | POA: Diagnosis present

## 2021-04-23 DIAGNOSIS — F32A Depression, unspecified: Secondary | ICD-10-CM | POA: Diagnosis present

## 2021-04-23 DIAGNOSIS — E669 Obesity, unspecified: Secondary | ICD-10-CM | POA: Diagnosis present

## 2021-04-23 DIAGNOSIS — I77810 Thoracic aortic ectasia: Secondary | ICD-10-CM | POA: Diagnosis present

## 2021-04-23 DIAGNOSIS — D75839 Thrombocytosis, unspecified: Secondary | ICD-10-CM | POA: Diagnosis present

## 2021-04-23 DIAGNOSIS — E860 Dehydration: Secondary | ICD-10-CM | POA: Diagnosis present

## 2021-04-23 DIAGNOSIS — G40802 Other epilepsy, not intractable, without status epilepticus: Secondary | ICD-10-CM | POA: Diagnosis present

## 2021-04-23 DIAGNOSIS — E739 Lactose intolerance, unspecified: Secondary | ICD-10-CM | POA: Diagnosis present

## 2021-04-23 HISTORY — DX: Unspecified intestinal obstruction, unspecified as to partial versus complete obstruction: K56.609

## 2021-04-23 HISTORY — DX: Hypertensive urgency: I16.0

## 2021-04-23 LAB — CBC
HCT: 42.7 % (ref 36.0–46.0)
Hemoglobin: 13.4 g/dL (ref 12.0–15.0)
MCH: 28.5 pg (ref 26.0–34.0)
MCHC: 31.4 g/dL (ref 30.0–36.0)
MCV: 90.7 fL (ref 80.0–100.0)
Platelets: 327 10*3/uL (ref 150–400)
RBC: 4.71 MIL/uL (ref 3.87–5.11)
RDW: 14.4 % (ref 11.5–15.5)
WBC: 12.8 10*3/uL — ABNORMAL HIGH (ref 4.0–10.5)
nRBC: 0 % (ref 0.0–0.2)

## 2021-04-23 LAB — BASIC METABOLIC PANEL
Anion gap: 11 (ref 5–15)
BUN: 27 mg/dL — ABNORMAL HIGH (ref 6–20)
CO2: 30 mmol/L (ref 22–32)
Calcium: 8.6 mg/dL — ABNORMAL LOW (ref 8.9–10.3)
Chloride: 99 mmol/L (ref 98–111)
Creatinine, Ser: 1.49 mg/dL — ABNORMAL HIGH (ref 0.44–1.00)
GFR, Estimated: 41 mL/min — ABNORMAL LOW (ref >60)
Glucose, Bld: 180 mg/dL — ABNORMAL HIGH (ref 70–99)
Potassium: 4 mmol/L (ref 3.5–5.1)
Sodium: 140 mmol/L (ref 135–145)

## 2021-04-23 LAB — PROTIME-INR
INR: 1.1 (ref 0.8–1.2)
Prothrombin Time: 13.7 seconds (ref 11.4–15.2)

## 2021-04-23 MED ORDER — LIDOCAINE VISCOUS HCL 2 % MT SOLN
15.0000 mL | Freq: Once | OROMUCOSAL | Status: AC
  Administered 2021-04-23: 15 mL via OROMUCOSAL
  Filled 2021-04-23: qty 15

## 2021-04-23 MED ORDER — ACETAMINOPHEN 325 MG PO TABS
650.0000 mg | ORAL_TABLET | Freq: Four times a day (QID) | ORAL | Status: DC | PRN
Start: 2021-04-23 — End: 2021-05-01

## 2021-04-23 MED ORDER — BACLOFEN 10 MG PO TABS
10.0000 mg | ORAL_TABLET | Freq: Three times a day (TID) | ORAL | Status: DC
  Administered 2021-04-23 – 2021-05-01 (×25): 10 mg via ORAL
  Filled 2021-04-23 (×25): qty 1

## 2021-04-23 MED ORDER — MIRABEGRON ER 50 MG PO TB24
50.0000 mg | ORAL_TABLET | Freq: Every day | ORAL | Status: DC
  Administered 2021-04-24 – 2021-05-01 (×8): 50 mg via ORAL
  Filled 2021-04-23 (×8): qty 1

## 2021-04-23 MED ORDER — ONDANSETRON HCL 4 MG PO TABS: 4.0000 mg | ORAL_TABLET | Freq: Four times a day (QID) | ORAL | Status: DC | PRN

## 2021-04-23 MED ORDER — HYDROMORPHONE HCL 1 MG/ML IJ SOLN
1.0000 mg | Freq: Once | INTRAMUSCULAR | Status: AC
  Administered 2021-04-23: 1 mg via INTRAVENOUS
  Filled 2021-04-23: qty 1

## 2021-04-23 MED ORDER — FLUTICASONE PROPIONATE 50 MCG/ACT NA SUSP
2.0000 | Freq: Every day | NASAL | Status: DC
  Administered 2021-04-26 – 2021-05-01 (×5): 2 via NASAL
  Filled 2021-04-23: qty 16

## 2021-04-23 MED ORDER — EZETIMIBE 10 MG PO TABS
10.0000 mg | ORAL_TABLET | Freq: Every day | ORAL | Status: DC
Start: 2021-04-23 — End: 2021-05-01
  Administered 2021-04-23 – 2021-05-01 (×9): 10 mg via ORAL
  Filled 2021-04-23 (×9): qty 1

## 2021-04-23 MED ORDER — MORPHINE SULFATE (PF) 2 MG/ML IV SOLN
2.0000 mg | INTRAVENOUS | Status: DC | PRN
  Administered 2021-04-23 – 2021-04-24 (×6): 2 mg via INTRAVENOUS
  Filled 2021-04-23 (×6): qty 1

## 2021-04-23 MED ORDER — ALBUTEROL SULFATE (2.5 MG/3ML) 0.083% IN NEBU: 3.0000 mL | INHALATION_SOLUTION | Freq: Four times a day (QID) | RESPIRATORY_TRACT | Status: DC | PRN

## 2021-04-23 MED ORDER — AMLODIPINE BESYLATE 5 MG PO TABS
10.0000 mg | ORAL_TABLET | Freq: Every day | ORAL | Status: DC
  Administered 2021-04-23 – 2021-05-01 (×8): 10 mg via ORAL
  Filled 2021-04-23 (×9): qty 2

## 2021-04-23 MED ORDER — GABAPENTIN 600 MG PO TABS: 600.0000 mg | ORAL_TABLET | ORAL | Status: DC

## 2021-04-23 MED ORDER — DULOXETINE HCL 60 MG PO CPEP
60.0000 mg | ORAL_CAPSULE | Freq: Every day | ORAL | Status: DC
  Administered 2021-04-24 – 2021-05-01 (×8): 60 mg via ORAL
  Filled 2021-04-23 (×9): qty 1

## 2021-04-23 MED ORDER — LABETALOL HCL 5 MG/ML IV SOLN
20.0000 mg | INTRAVENOUS | Status: DC | PRN
  Administered 2021-04-23: 20 mg via INTRAVENOUS
  Filled 2021-04-23: qty 4

## 2021-04-23 MED ORDER — BUDESONIDE 0.5 MG/2ML IN SUSP: 0.5000 mg | Freq: Every day | RESPIRATORY_TRACT | Status: DC

## 2021-04-23 MED ORDER — ALBUTEROL SULFATE HFA 108 (90 BASE) MCG/ACT IN AERS: 2.0000 | INHALATION_SPRAY | Freq: Four times a day (QID) | RESPIRATORY_TRACT | Status: DC | PRN

## 2021-04-23 MED ORDER — IRBESARTAN 150 MG PO TABS
300.0000 mg | ORAL_TABLET | Freq: Every day | ORAL | Status: DC
Start: 2021-04-23 — End: 2021-05-01
  Administered 2021-04-23 – 2021-05-01 (×7): 300 mg via ORAL
  Filled 2021-04-23 (×10): qty 2

## 2021-04-23 MED ORDER — VITAMIN D 25 MCG (1000 UNIT) PO TABS
1000.0000 [IU] | ORAL_TABLET | Freq: Every day | ORAL | Status: DC
  Administered 2021-04-24 – 2021-05-01 (×8): 1000 [IU] via ORAL
  Filled 2021-04-23 (×8): qty 1

## 2021-04-23 MED ORDER — NITROGLYCERIN 0.4 MG SL SUBL: 0.4000 mg | SUBLINGUAL_TABLET | SUBLINGUAL | Status: DC | PRN

## 2021-04-23 MED ORDER — LEVETIRACETAM 750 MG PO TABS
750.0000 mg | ORAL_TABLET | Freq: Every day | ORAL | Status: DC
Start: 2021-04-23 — End: 2021-05-01
  Administered 2021-04-23 – 2021-04-30 (×8): 750 mg via ORAL
  Filled 2021-04-23 (×8): qty 1

## 2021-04-23 MED ORDER — ROSUVASTATIN CALCIUM 10 MG PO TABS
10.0000 mg | ORAL_TABLET | Freq: Every day | ORAL | Status: DC
  Administered 2021-04-24 – 2021-05-01 (×8): 10 mg via ORAL
  Filled 2021-04-23 (×8): qty 1

## 2021-04-23 MED ORDER — GABAPENTIN 600 MG PO TABS
1200.0000 mg | ORAL_TABLET | Freq: Every day | ORAL | Status: DC
Start: 2021-04-23 — End: 2021-05-01
  Administered 2021-04-23 – 2021-04-30 (×8): 1200 mg via ORAL
  Filled 2021-04-23 (×8): qty 2

## 2021-04-23 MED ORDER — HYDRALAZINE HCL 50 MG PO TABS
50.0000 mg | ORAL_TABLET | Freq: Three times a day (TID) | ORAL | Status: DC
  Administered 2021-04-23 – 2021-05-01 (×22): 50 mg via ORAL
  Filled 2021-04-23 (×22): qty 1

## 2021-04-23 MED ORDER — TRAZODONE HCL 50 MG PO TABS
25.0000 mg | ORAL_TABLET | Freq: Every evening | ORAL | Status: DC | PRN
  Administered 2021-04-28 – 2021-04-29 (×2): 25 mg via ORAL
  Filled 2021-04-23 (×2): qty 1

## 2021-04-23 MED ORDER — LEVETIRACETAM 500 MG PO TABS
500.0000 mg | ORAL_TABLET | Freq: Every day | ORAL | Status: DC
  Administered 2021-04-23 – 2021-05-01 (×9): 500 mg via ORAL
  Filled 2021-04-23 (×9): qty 1

## 2021-04-23 MED ORDER — LEVETIRACETAM 500 MG PO TABS: 500.0000 mg | ORAL_TABLET | ORAL | Status: DC

## 2021-04-23 MED ORDER — SODIUM CHLORIDE 0.9 % IV SOLN: INTRAVENOUS | Status: DC

## 2021-04-23 MED ORDER — MOMETASONE FURO-FORMOTEROL FUM 200-5 MCG/ACT IN AERO
2.0000 | INHALATION_SPRAY | Freq: Two times a day (BID) | RESPIRATORY_TRACT | Status: DC
  Administered 2021-04-23 – 2021-05-01 (×17): 2 via RESPIRATORY_TRACT
  Filled 2021-04-23: qty 8.8

## 2021-04-23 MED ORDER — EPINEPHRINE 0.3 MG/0.3ML IJ SOAJ
0.3000 mg | INTRAMUSCULAR | Status: DC | PRN
  Filled 2021-04-23: qty 0.3

## 2021-04-23 MED ORDER — CICLOPIROX 8 % EX SOLN: Freq: Every day | CUTANEOUS | Status: DC

## 2021-04-23 MED ORDER — ONDANSETRON HCL 4 MG/2ML IJ SOLN
4.0000 mg | Freq: Four times a day (QID) | INTRAMUSCULAR | Status: DC | PRN
Start: 2021-04-23 — End: 2021-05-01
  Administered 2021-04-23 – 2021-04-30 (×3): 4 mg via INTRAVENOUS
  Filled 2021-04-23 (×3): qty 2

## 2021-04-23 MED ORDER — DAPAGLIFLOZIN PROPANEDIOL 10 MG PO TABS
10.0000 mg | ORAL_TABLET | Freq: Every day | ORAL | Status: DC
  Administered 2021-04-23 – 2021-05-01 (×9): 10 mg via ORAL
  Filled 2021-04-23 (×9): qty 1

## 2021-04-23 MED ORDER — ACETAMINOPHEN 650 MG RE SUPP: 650.0000 mg | Freq: Four times a day (QID) | RECTAL | Status: DC | PRN

## 2021-04-23 MED ORDER — OLMESARTAN MEDOXOMIL-HCTZ 40-25 MG PO TABS
1.0000 | ORAL_TABLET | Freq: Every day | ORAL | Status: DC
Start: 2021-04-23 — End: 2021-04-23

## 2021-04-23 MED ORDER — CYCLOSPORINE 0.05 % OP EMUL
2.0000 [drp] | Freq: Two times a day (BID) | OPHTHALMIC | Status: DC
  Administered 2021-04-24 – 2021-05-01 (×13): 2 [drp] via OPHTHALMIC
  Filled 2021-04-23 (×17): qty 30

## 2021-04-23 MED ORDER — CLONIDINE HCL 0.1 MG PO TABS
0.1000 mg | ORAL_TABLET | Freq: Two times a day (BID) | ORAL | Status: DC
  Administered 2021-04-23 – 2021-05-01 (×15): 0.1 mg via ORAL
  Filled 2021-04-23 (×16): qty 1

## 2021-04-23 MED ORDER — POLYETHYLENE GLYCOL 3350 17 G PO PACK
17.0000 g | PACK | Freq: Every day | ORAL | Status: DC | PRN
  Administered 2021-04-27: 17 g via ORAL
  Filled 2021-04-23: qty 1

## 2021-04-23 MED ORDER — POTASSIUM CHLORIDE CRYS ER 20 MEQ PO TBCR
20.0000 meq | EXTENDED_RELEASE_TABLET | Freq: Two times a day (BID) | ORAL | Status: DC
  Administered 2021-04-24 – 2021-05-01 (×15): 20 meq via ORAL
  Filled 2021-04-23 (×15): qty 1

## 2021-04-23 MED ORDER — HYDROCHLOROTHIAZIDE 25 MG PO TABS
25.0000 mg | ORAL_TABLET | Freq: Every day | ORAL | Status: DC
  Administered 2021-04-24 – 2021-05-01 (×6): 25 mg via ORAL
  Filled 2021-04-23 (×8): qty 1

## 2021-04-23 MED ORDER — CARVEDILOL 25 MG PO TABS
25.0000 mg | ORAL_TABLET | Freq: Two times a day (BID) | ORAL | Status: DC
  Administered 2021-04-23 – 2021-05-01 (×15): 25 mg via ORAL
  Filled 2021-04-23 (×16): qty 1

## 2021-04-23 MED ORDER — MAGNESIUM HYDROXIDE 400 MG/5ML PO SUSP
30.0000 mL | Freq: Every day | ORAL | Status: DC | PRN
  Administered 2021-04-29 – 2021-04-30 (×2): 30 mL via ORAL
  Filled 2021-04-23 (×2): qty 30

## 2021-04-23 MED ORDER — GABAPENTIN 600 MG PO TABS
600.0000 mg | ORAL_TABLET | Freq: Two times a day (BID) | ORAL | Status: DC
  Administered 2021-04-23 – 2021-05-01 (×16): 600 mg via ORAL
  Filled 2021-04-23 (×16): qty 1

## 2021-04-23 MED ORDER — ENOXAPARIN SODIUM 60 MG/0.6ML IJ SOSY
0.5000 mg/kg | PREFILLED_SYRINGE | INTRAMUSCULAR | Status: DC
  Administered 2021-04-23 – 2021-05-01 (×9): 57.5 mg via SUBCUTANEOUS
  Filled 2021-04-23 (×10): qty 0.6

## 2021-04-23 MED ORDER — SPIRONOLACTONE 25 MG PO TABS
25.0000 mg | ORAL_TABLET | Freq: Every day | ORAL | Status: DC
  Administered 2021-04-24 – 2021-05-01 (×7): 25 mg via ORAL
  Filled 2021-04-23 (×8): qty 1

## 2021-04-23 MED ORDER — BUTALBITAL-APAP-CAFFEINE 50-325-40 MG PO TABS: 1.0000 | ORAL_TABLET | Freq: Four times a day (QID) | ORAL | Status: DC | PRN

## 2021-04-23 NOTE — Assessment & Plan Note (Addendum)
Continue sliding scale. ?Continue Neurontin for her diabetic neuropathy. ?

## 2021-04-23 NOTE — Assessment & Plan Note (Addendum)
Continue Keppra.

## 2021-04-23 NOTE — Assessment & Plan Note (Addendum)
Continue statin therapy.

## 2021-04-23 NOTE — Progress Notes (Signed)
[facility] Progress Note ? ?Patient: [name]    GGY:[id number]  DOA: 04/22/2021    ?Date of Service: the patient was seen and examined on 04/23/2021 ? ?Brief hospital course: ?56 year old female with past medical history that includes hypertension, diabetes, diastolic heart failure, COPD and previous CVA on Plavix who presented to the emergency room on night of 4/4 with 1 day of abdominal pain along with nausea and vomiting and work-up revealed small bowel obstruction.  Patient had NG tube placed and admitted to the hospitalist service.  General surgery consulted who recommended initial conservative measures. ? ?Assessment and Plan: ?Assessment and Plan: ?SBO (small bowel obstruction) ([place]) ?Stable.  NG tube placed and general surgery following.  Conservative measures.  Follow-up x-ray in the morning. ? ?AKI (acute kidney injury) ([place]) ?- This is prerenal secondary to recurrent nausea and vomiting and volume depletion.  Slowly improving with IV fluids. ? ?Hypertensive urgency ?- We will continue antihypertensives and place her on as needed IV labetalol.  Stable.  Systolic between 035K to 093G. ? ?Chronic diastolic CHF (congestive heart failure) ([place]) ?Echocardiogram in 2017 noted grade 1 diastolic dysfunction.  Patient came in dry and is getting IV fluids.  Monitor input and output.  We will check BNP once she is more euvolemic and closer to discharge. ? ?Seizure disorder ([place]) ?- We will continue Keppra. ? ?Type 2 diabetes mellitus with diabetic neuropathy ([place]) ?- We will place on supplement coverage with NovoLog. ?- We will continue her basal coverage. ?- We will continue her Neurontin for her diabetic neuropathy. ? ?Dyslipidemia ?- We will continue statin therapy. ? ?GERD without esophagitis ?- We will continue her PPI therapy. ? ?Class 3 severe obesity with serious comorbidity and body mass index (BMI) of 40.0 to 44.9 in adult [facility]) ?Patient meets criteria for morbid obesity with BMI greater  than 40 ? ? ? ? ? ? ?Body mass index is 41.64 kg/m?.  ?  ?   ? ?Consultants: ?General surgery ? ?Procedures: ?NG tube placement ? ?Antimicrobials: ?None ? ?Code Status: Full code ? ? ?Subjective: Patient with some mild abdominal discomfort ? ?Objective: ?Noted mildly elevated blood pressures ?Vitals:  ? 04/23/21 0417 04/23/21 0800  ?BP: (!) 153/84 (!) 142/64  ?Pulse: 91 99  ?Resp: 20 18  ?Temp: (!) 97.5 ?F (36.4 ?C) 98.2 ?F (36.8 ?C)  ?SpO2: 91% 93%  ? ? ?Intake/Output Summary (Last 24 hours) at 04/23/2021 1602 ?Last data filed at 04/23/2021 1300 ?Gross per 24 hour  ?Intake --  ?Output 2600 ml  ?Net -2600 ml  ? ?Filed Weights  ? 04/22/21 2019  ?Weight: 117 kg  ? ?Body mass index is 41.64 kg/m?. ? ?Exam: ? ?General: Alert and oriented x3, no acute distress ?HEENT: Normocephalic, atraumatic, NG tube in place, mucous membranes are slightly dry ?Cardiovascular: Regular rate and rhythm, S1-S2 ?Respiratory: Clear to auscultation bilaterally ?Abdomen: Soft, minimal tenderness generalized, mildly distended, few bowel sounds ?Musculoskeletal: No clubbing or cyanosis, trace pitting edema ?Skin: No skin breaks, tears or lesions ?Psychiatry: Appropriate, no evidence of psychoses ?Neurology: No focal deficits ? ?Data Reviewed: ?Creatinine with mild improvement, down to 1.49 ? ?Disposition:  ?Status is: Inpatient ?Remains inpatient appropriate because: Continued small bowel obstruction ?  ? ?Anticipated discharge date: 4/8 ? ?Remaining issues to be resolved so that patient can be discharged: Resolution of small bowel obstruction ? ? ?Family Communication: Unable to leave message for daughter ?DVT Prophylaxis: ? ?  Lovenox ? ? ?Author: ?Annita Brod ,MD ?04/23/2021  4:02 PM ? ?To reach On-call, see care teams to locate the attending and reach out via [email or web address]. ?Between 7PM-7AM, please contact night-coverage ?If you still have difficulty reaching the attending provider, please page the [facility] (Director on Call) for [facility] on amion for assistance. ? ?

## 2021-04-23 NOTE — Assessment & Plan Note (Addendum)
Continue PPI therapy. 

## 2021-04-23 NOTE — Progress Notes (Signed)
PHARMACIST - PHYSICIAN COMMUNICATION ? ?CONCERNING:  Enoxaparin (Lovenox) for DVT Prophylaxis  ? ? ?RECOMMENDATION: ?Patient was prescribed enoxaprin 47m q24 hours for VTE prophylaxis.  ? ?Filed Weights  ? 04/22/21 2019  ?Weight: 117 kg (258 lb)  ? ? ?Body mass index is 41.64 kg/m?. ? ?Estimated Creatinine Clearance: 52.4 mL/min (A) (by C-G formula based on SCr of 1.56 mg/dL (H)). ? ? ?Based on CCambridgepatient is candidate for enoxaparin 0.560mkg TBW SQ every 24 hours based on BMI being >30. ? ?DESCRIPTION: ?Pharmacy has [facility]arin dose [facility] ? ?Patient is now receiving enoxaparin 0.5 mg/kg every 24 hours  ? ?NaRenda RollsPharmD, MBA ?04/23/2021 ?1:51 AM ? ? ?

## 2021-04-23 NOTE — Assessment & Plan Note (Deleted)
-   We will place on supplement coverage with NovoLog. ?- We will continue her basal coverage. ?- We will continue her Zetia and statin therapy. ?- We will continue her Neurontin for her diabetic neuropathy. ?

## 2021-04-23 NOTE — Assessment & Plan Note (Signed)
>>  ASSESSMENT AND PLAN FOR SEIZURE (HCC) WRITTEN ON 04/30/2021  1:17 PM BY VON BOGUS, MD  Continue Keppra .

## 2021-04-23 NOTE — H&P (Signed)
?  ?  ?[place] ? ? ?PATIENT NAME: Francine Hannan   ? ?MR#:  [id number] ? ?DATE OF BIRTH:  26-Oct-1965 ? ?DATE OF ADMISSION:  04/22/2021 ? ?PRIMARY CARE PHYSICIAN: Delsa Grana, [name]  ? ?Patient is coming from: Home ? ?REQUESTING/REFERRING PHYSICIAN: Ward, Delice Bison, DO ?CHIEF COMPLAINT:  ? ?Chief Complaint  ?Patient presents with  ? Abdominal Pain  ? ? ?HISTORY OF PRESENT ILLNESS:  ?Cassity Christian is a 56 y.o. female with medical history significant for multiple medical problems that are mentioned below, including CVA on Plavix, hypertension, diabetes, hyperlipidemia, diastolic heart failure and COPD, who presented to the emergency room with acute onset of abdominal pain and distention with associated nausea and vomiting today.  She admitted to having a black bowel movement earlier today.  Since then she has not been able to have bowel movements or flatus.  She also reported seeing dark emesis.  No bright red bleeding per rectum or bloody vomitus.  She is status post 2 bilateral tubal ligation and hernia repair.  She denies any fever or chills.  No dysuria, oliguria or hematuria or flank pain.  No chest pain or palpitations. ? ?ED Course: When she came to the ER, BP was 152/103 with heart rate of 132 with otherwise normal vital signs.  Labs revealed a BUN of 27 and creatinine of 4.56 with calcium of 10.6 and protein of 10 anion gap of 16.  CBC showed leukocytosis of 12.5 with thrombocytosis and hemoconcentration. ? ?EKG showed tachycardia with rate of 121 with biatrial enlargement LVH with repolarization abnormality ? ?Imaging: Abdominal pelvic CT scan showed the following ?Dilated stomach, proximal and mid small bowel loops with fluid and ?air-fluid levels compatible with mid to distal small bowel ?obstruction. Exact transition or cause not visualized. ?  ?Bilateral nephrolithiasis.  No hydronephrosis. ?  ?Sigmoid diverticulosis. ? ?NG tube was placed and abdominal x-ray showed: ?1. NGT is adequately  intragastric. ?2. Persistent small bowel dilatation in the upper abdomen, maximum ?visible small bowel caliber 4.5 cm. ? ?The patient was given 4 mg of IV morphine sulfate and 4 mg of IV Dilaudid, 4 mg of IV Zofran twice and 40 mg of IV Protonix with 1.5 L bolus of IV normal saline.  She will be admitted to a medical bed for further evaluation and management. ?  ?PAST MEDICAL HISTORY:  ? ?Past Medical History:  ?Diagnosis Date  ? Anxiety and depression 09/13/2006  ? Qualifier: Diagnosis of  By: Hassell Done [name], Tori Milks    ? Arthritis   ? joint pain   ? Asthma   ? COPD (chronic obstructive pulmonary disease) ([place])   ? chronic bronchitis   ? Depression   ? Diabetes mellitus   ? since age 89; type 2 IDDM  ? Diabetic neuropathy, painful ([place])   ? FEET AND HANDS  ? Diabetic retinopathy ([place])   ? Diastolic dysfunction   ? a.  Echo 11/17: EF 60-65%, mild LVH, no RWMA, Gr1DD, mild AI, dilated aortic root measuring 38 mm, mildly dilated ascending aorta  ? Dilated aortic root ([place])   ? 69m by echo 11/2015  ? Dizziness   ? secondary to diabetes and hypertension   ? Epilepsy idiopathic petit mal ([place]   ? last seizure 2012;controlled w/ topomax  ? GERD (gastroesophageal reflux disease)   ? Glaucoma   ? NOT ON ANY EYE DROPS   ? Headache(784.0)   ? migraines   ? Heart murmur   ? born with   ?  History of stress test   ? a. 11/17: Normal perfusion, EF 53%, normal study  ? Hx of blood clots   ? hematomas removed from left side of brain from 79mo to 514yrold   ? Hyperlipidemia   ? Hypertension   ? Legally blind   ? left eye   ? MIGRAINE HEADACHE 09/14/2006  ? Qualifier: Diagnosis of  By: MaHassell DoneNP, NyTori Milks  ? [facility]  ? a.  Patient reported history of without objective documentation  ? Nephrolithiasis   ? frequent urination , urination at nite  PT SEEN IN ER 09/22/11 FOR BACK AND RT SIDED PAIN--HAS KNOWN STONE RT URETER AND UA IN ER SHOWED UTI  ? Pain 09/23/11  ? LOWER BACK AND RIGHT SIDE--PT HAS RIGHT URETERAL STONE   ? Pneumonia   ? hx of 2009  ? Pyelonephritis   ? Seizures ([facility]  ? Sleep apnea   [facility] ; mild  ? Stress incontinence   ? [facility]  ? [facility]  RESIDUAL LEFT LEG WEAKNESS--NO OTHER RESIDUAL PROBLEMS  ? ? ?PAST SURGICAL HISTORY:  ? ?Past Surgical History:  ?Procedure Laterality Date  ? BRAIN HEMATOMA EVACUATION    ? five procedures total, first procedure when [age]onths old, last at [age]ears of age.  ? CYSTOSCOPY W/ RETROGRADES  09/24/2011  ? Procedure: CYSTOSCOPY WITH RETROGRADE PYELOGRAM;  Surgeon: JoMalka SoMD;  [facility];  Service: Urology;  Laterality: Right;  ? CYSTOSCOPY WITH URETEROSCOPY  09/24/2011  ? Procedure: CYSTOSCOPY WITH URETEROSCOPY;  Surgeon: JoMalka SoMD;  [facility];  Service: Urology;  Laterality: Right;  Balloon dilation right ureter ?  ? CYSTOSCOPY/RETROGRADE/URETEROSCOPY  08/24/2011  ? Procedure: CYSTOSCOPY/RETROGRADE/URETEROSCOPY;  Surgeon: DaMolli HazardMD;  [facility];  Service: Urology;  Laterality: Right;  Cysto, Right retrograde Pyelogram, right stent placement.   ? INCISIONAL HERNIA REPAIR  05/05/2011  ? Procedure: LAPAROSCOPIC INCISIONAL HERNIA;  Surgeon: MaRolm BookbinderMD;  [place] Service: General;  Laterality: N/A;  ? kidney stone removal    ? MASS EXCISION  05/05/2011  ? Procedure: EXCISION MASS;  Surgeon: MaRolm BookbinderMD;  [place] Service: General;  Laterality: Right;  ? PCNL    ? RETINAL DETACHMENT SURGERY Left 1990  ? RIGHT/LEFT HEART CATH AND CORONARY ANGIOGRAPHY N/A 04/28/2019  ? Procedure: RIGHT/LEFT HEART CATH AND CORONARY ANGIOGRAPHY;  Surgeon: CaYolonda KidaMD;  [place] [place];  Service: Cardiovascular;  Laterality: N/A;  ? SHUNT REMOVAL    ? shunt inserted at age [age]emoved at age 56 ? VAGINAL HYSTERECTOMY  1996  ? ? ?SOCIAL HISTORY:  ? ?Social History  ? ?Tobacco Use  ? Smoking status: Former  ?  Packs/day: 1.00  ?  Years: 15.00  ?  Pack years:  15.00  ?  Types: Cigarettes  ?  Quit date: 01/20/1987  ?  Years since quitting: 34.2  ? Smokeless tobacco: Never  ? Tobacco comments:  ?  quit more than 20 years  ?Substance Use Topics  ? Alcohol use: Yes  ?  Alcohol/week: 1.0 standard drink  ?  Types: 1 Glasses of wine per week  ? ? ?FAMILY HISTORY:  ? ?Family History  ?Problem Relation Age of Onset  ? Uterine cancer Mother   ? Hypertension Mother   ? Cancer Mother   ? Brain cancer Maternal  Grandmother   ? Hypertension Maternal Grandmother   ? Birth defects Daughter   ? Hypertension Daughter   ? Cirrhosis Maternal Grandfather   ? ADD / ADHD Maternal Grandfather   ? Birth defects Maternal Grandfather   ? Diabetes Maternal Grandfather   ? Anesthesia problems Neg Hx   ? Colon cancer Neg Hx   ? Esophageal cancer Neg Hx   ? Pancreatic cancer Neg Hx   ? ? ?DRUG ALLERGIES:  ? ?Allergies  ?Allergen Reactions  ? Codeine Swelling and Other (See Comments)  ?  Swelling and burning of mouth (inside)  ? Lactose Intolerance (Gi) Nausea And Vomiting  ? ? ?REVIEW OF SYSTEMS:  ? ?ROS ?As per history of present illness. All pertinent systems were reviewed above. Constitutional, HEENT, cardiovascular, respiratory, GI, GU, musculoskeletal, neuro, psychiatric, endocrine, integumentary and hematologic systems were reviewed and are otherwise negative/unremarkable except for positive findings mentioned above in the HPI. ? ? ?MEDICATIONS AT HOME:  ? ?Prior to Admission medications   ?Medication Sig Start Date End Date Taking? Authorizing Provider  ?albuterol (ACCUNEB) 1.25 MG/3ML nebulizer solution Inhale 1 ampule into the lungs every 6 (six) hours as needed for wheezing or shortness of breath.    [provider]  ?albuterol (PROAIR HFA) 108 (90 Base) MCG/ACT inhaler INHALE 2 PUFFS BY MOUTH EVERY 6 HOURS AS NEEDED FOR WHEEZING OR SHORTNESS OF BREATH 01/24/18   Hubbard Hartshorn, [name]  ?amitriptyline (ELAVIL) 25 MG tablet Take 2-3 tablets (50-75 mg total) by mouth at bedtime. 01/07/21  07/06/21  Gillis Santa, MD  ?amLODipine (NORVASC) 10 MG tablet Take 1 tablet by mouth daily. 12/26/20 12/26/21  [provider]  ?baclofen (LIORESAL) 10 MG tablet Take 1 tablet (10 mg total) by mouth 3 (

## 2021-04-23 NOTE — Assessment & Plan Note (Addendum)
Patient meets criteria for morbid obesity with BMI greater than 40. ?

## 2021-04-23 NOTE — Hospital Course (Addendum)
This 56 year old female with past medical history significant for hypertension, diabetes, diastolic heart failure, COPD and previous CVA on Plavix who presented to the emergency room on night of 4/4 with 1 day  of abdominal pain along with nausea and vomiting and work-up revealed small bowel obstruction.  Patient had NG tube placed and admitted to the hospitalist service.  General surgery consulted who recommended initial conservative measures. Over the next few days, bowel obstruction slowly improving.  Patient able to have NG tube removed on evening of 4/7 and started on clear liquids which she is tolerating.  Diet advanced to full liquids at 4/8.  Patient tolerating full liquids and diet advanced to solid food as of this morning.  Patient still complains some abdominal discomfort and nausea although not enough that she will is taking Zofran.  She is still quite constipated. ?Patient was admitted for small bowel obstruction which was managed conservatively and significantly improved.  SBO has resolved.  Patient started on soft bland diet tolerated well.  Patient has developed dizziness on the day of discharge which delayed her discharge date for 1 day.  Patient feels better,  has bowel movement patient is being discharged home. ? ?

## 2021-04-23 NOTE — Progress Notes (Signed)
Mobility Specialist - Progress Note ?  ? 04/23/21 1300  ?Mobility  ?Activity Ambulated with assistance in hallway;Stood at bedside;Dangled on edge of bed  ?Level of Assistance Standby assist, set-up cues, supervision of patient - no hands on  ?Assistive Device Front wheel walker  ?Distance Ambulated (ft) 100 ft  ?Activity Response Tolerated well  ?$Mobility charge 1 Mobility  ? ? ? ?Pre-mobility: 92 HR, 97% SpO2 ?During mobility: 96 HR,92% SpO2 ? ?Pt supine upon arrival using RA with NT and RN present. Pt completes bed mobility to sit EOB with ModI and STS MinA. Pt ambulates 100 ft voicing no complaints but requests to turn to room d/t weakness. Pt left in bed with needs in reach. ? ?Merrily Brittle ?Mobility Specialist ?04/23/21, 1:40 PM ? ? ? ? ?

## 2021-04-23 NOTE — Assessment & Plan Note (Addendum)
Continue antihypertensives. ?

## 2021-04-23 NOTE — Assessment & Plan Note (Addendum)
Echo: in 2017 noted grade 1 diastolic dysfunction.  Patient appears euvolemic.  Continued IV hydration.  Monitor input and output.  Minimally elevated BNP on 4/8 of 276.  Given 1 dose of IV Lasix.  Pressures a little soft after Lasix.  Suspect that is the cause of her dizziness.  Blood pressure is now normal. ?

## 2021-04-23 NOTE — ED Notes (Signed)
Daughter updated with plan of care and pt status per request.  ?

## 2021-04-23 NOTE — Consult Note (Signed)
[facility] ?SURGICAL CONSULTATION NOTE (initial) - cpt: 75170 ? ? ?HISTORY OF PRESENT ILLNESS (HPI):  ?56 y.o. female presented to [facility] ED overnight for evaluation of abdominal pain. Patient reports the acute on set of generalized abdominal pain yesterday. This has been accompanied by nausea and emesis as well. Her last bowel movement was yesterday morning but since has had cessation in flatus. No fever, chills, cough, CP, SOB, or urinary changes. She does have a history of small bowel obstructions and this is similar. Previous surgical history is positive for ventral hernia repair, BTL, and vaginal hysterectomy. She was seen by our practice in August of 2022 in follow up and was doing well. She had GB worked up as well which was normal. She does have a known right adrenal adenoma and was recommended she undergo functional work up at that time, but I do not see any record of this being completed. Work up in the ED yesterday revealed mild leukocytosis to 12.5K with Hgb to 16.0 (most likely hemoconcentration in setting of dehydration),  sCr - 1.56 (now 1.49), and CT Abdomen/Pelvis was concerning for dilation in stomach and proximal small bowel concerning for possible obstruction. She was admitted to the medicine service and had NGT placed. Output recorded at 500 ccs.  ? ?Surgery is consulted by hospitalist physician Dr. Eugenie Norrie, MD in this context for evaluation and management of SBO. ? ?PAST MEDICAL HISTORY (PMH):  ?Past Medical History:  ?Diagnosis Date  ? Anxiety and depression 09/13/2006  ? Qualifier: Diagnosis of  By: Hassell Done [name], Tori Milks    ? Arthritis   ? joint pain   ? Asthma   ? COPD (chronic obstructive pulmonary disease) ([place])   ? chronic bronchitis   ? Depression   ? Diabetes mellitus   ? since age 9; type 2 IDDM  ? Diabetic neuropathy, painful ([place])   ? FEET AND HANDS  ? Diabetic retinopathy ([place])   ? Diastolic dysfunction   ? a.  Echo 11/17: EF 60-65%, mild LVH, no RWMA, Gr1DD, mild  AI, dilated aortic root measuring 38 mm, mildly dilated ascending aorta  ? Dilated aortic root ([place])   ? 38m by echo 11/2015  ? Dizziness   ? secondary to diabetes and hypertension   ? Epilepsy idiopathic petit mal ([place]   ? last seizure 2012;controlled w/ topomax  ? GERD (gastroesophageal reflux disease)   ? Glaucoma   ? NOT ON ANY EYE DROPS   ? Headache(784.0)   ? migraines   ? Heart murmur   ? born with   ? History of stress test   ? a. 11/17: Normal perfusion, EF 53%, normal study  ? Hx of blood clots   ? hematomas removed from left side of brain from [id number]   ? Hyperlipidemia   ? Hypertension   ? Legally blind   ? left eye   ? MIGRAINE HEADACHE 09/14/2006  ? Qualifier: Diagnosis of  By: MarHassell DoneP, NykTori Milks ? [facility] ? a.  Patient reported history of without objective documentation  ? Nephrolithiasis   ? frequent urination , urination at nite  PT SEEN IN ER 09/22/11 FOR BACK AND RT SIDED PAIN--HAS KNOWN STONE RT URETER AND UA IN ER SHOWED UTI  ? Pain 09/23/11  ? LOWER BACK AND RIGHT SIDE--PT HAS RIGHT URETERAL STONE  ? Pneumonia   ? hx of 2009  ? Pyelonephritis   ? Seizures ([facility] ?  Sleep apnea   [facility] ; mild  ? Stress incontinence   ? Stroke [facility])   ? last 2003  RESIDUAL LEFT LEG WEAKNESS--NO OTHER RESIDUAL PROBLEMS  ?  ? ?PAST SURGICAL HISTORY ([place]):  ?Past Surgical History:  ?Procedure Laterality Date  ? BRAIN HEMATOMA EVACUATION    ? five procedures total, first procedure when 34 months old, last at 56 years of age.  ? CYSTOSCOPY W/ RETROGRADES  09/24/2011  ? Procedure: CYSTOSCOPY WITH RETROGRADE PYELOGRAM;  Surgeon: Malka So, MD;  Location: [facility];  Service: Urology;  Laterality: Right;  ? CYSTOSCOPY WITH URETEROSCOPY  09/24/2011  ? Procedure: CYSTOSCOPY WITH URETEROSCOPY;  Surgeon: Malka So, MD;  Location: [facility];  Service: Urology;  Laterality: Right;  Balloon dilation right ureter ?  ? CYSTOSCOPY/RETROGRADE/URETEROSCOPY   08/24/2011  ? Procedure: CYSTOSCOPY/RETROGRADE/URETEROSCOPY;  Surgeon: Molli Hazard, MD;  Location: [facility];  Service: Urology;  Laterality: Right;  Cysto, Right retrograde Pyelogram, right stent placement.   ? INCISIONAL HERNIA REPAIR  05/05/2011  ? Procedure: LAPAROSCOPIC INCISIONAL HERNIA;  Surgeon: Rolm Bookbinder, MD;  Location: [place];  Service: General;  Laterality: N/A;  ? kidney stone removal    ? MASS EXCISION  05/05/2011  ? Procedure: EXCISION MASS;  Surgeon: Rolm Bookbinder, MD;  Location: [place];  Service: General;  Laterality: Right;  ? PCNL    ? RETINAL DETACHMENT SURGERY Left 1990  ? RIGHT/LEFT HEART CATH AND CORONARY ANGIOGRAPHY N/A 04/28/2019  ? Procedure: RIGHT/LEFT HEART CATH AND CORONARY ANGIOGRAPHY;  Surgeon: Yolonda Kida, MD;  Location: [place] [facility];  Service: Cardiovascular;  Laterality: N/A;  ? SHUNT REMOVAL    ? shunt inserted at age 32 removed at age 56   ? VAGINAL HYSTERECTOMY  1996  ?  ? ?MEDICATIONS:  ?Prior to Admission medications   ?Medication Sig Start Date End Date Taking? Authorizing Provider  ?albuterol (ACCUNEB) 1.25 MG/3ML nebulizer solution Inhale 1 ampule into the lungs every 6 (six) hours as needed for wheezing or shortness of breath.    [provider]  ?albuterol (PROAIR HFA) 108 (90 Base) MCG/ACT inhaler INHALE 2 PUFFS BY MOUTH EVERY 6 HOURS AS NEEDED FOR WHEEZING OR SHORTNESS OF BREATH 01/24/18   Hubbard Hartshorn, [name]  ?amitriptyline (ELAVIL) 25 MG tablet Take 2-3 tablets (50-75 mg total) by mouth at bedtime. 01/07/21 07/06/21  Gillis Santa, MD  ?amLODipine (NORVASC) 10 MG tablet Take 1 tablet by mouth daily. 12/26/20 12/26/21  [provider]  ?baclofen (LIORESAL) 10 MG tablet Take 1 tablet (10 mg total) by mouth 3 (three) times daily. 03/18/21   Mecum, Erin E, [name]  ?budesonide (PULMICORT) 0.5 MG/2ML nebulizer solution Inhale 0.5 mg into the lungs daily.    Ottie Glazier, MD  ?butalbital-acetaminophen-caffeine [facility]) [phone] MG  tablet Take 1-2 tablets by mouth every 6 (six) hours as needed for headache. 03/10/21 03/10/22  Gillis Santa, MD  ?carvedilol (COREG) 25 MG tablet Take 25 mg by mouth 2 (two) times daily. 03/06/21   [provider]  ?cholecalciferol (VITAMIN D3) 25 MCG (1000 UNIT) tablet Take 1,000 Units by mouth daily.    [provider]  ?ciclopirox (PENLAC) 8 % solution Apply topically at bedtime. 02/01/21   [provider]  ?cloNIDine (CATAPRES) 0.1 MG tablet Take 1 tablet (0.1 mg total) by mouth 2 (two) times daily. 12/31/20 12/31/21  Vladimir Crofts, MD  ?clopidogrel (PLAVIX) 75 MG tablet Take 1 tablet (75 mg total)  by mouth daily. 10/10/19   Towanda Malkin, MD  ?Continuous Blood Gluc Sensor (FREESTYLE LIBRE 2 SENSOR) MISC Change every 14 days 01/30/21   Elayne Snare, MD  ?cycloSPORINE (RESTASIS) 0.05 % ophthalmic emulsion Place 2 drops into both eyes 2 (two) times daily.    [provider]  ?Dulaglutide (TRULICITY) 4.5 ZO/1.0RU SOPN Inject 4.5 mg as directed once a week. 10/22/20   Elayne Snare, MD  ?DULoxetine (CYMBALTA) 60 MG capsule Take 1 capsule (60 mg total) by mouth daily. 02/14/21   Bo Merino, [name]  ?EPINEPHrine 0.3 mg/0.3 mL IJ SOAJ injection Inject 0.3 mg into the muscle as needed for anaphylaxis. 03/29/19   [provider]  ?ezetimibe (ZETIA) 10 MG tablet Take 1 tablet (10 mg total) by mouth daily. 04/14/21   Bo Merino, [name]  ?FARXIGA 10 MG TABS tablet TAKE 1 TABLET BY MOUTH EVERY DAY 09/30/20   Elayne Snare, MD  ?fluticasone (FLONASE) 50 MCG/ACT nasal spray Place 2 sprays into both nostrils daily. 04/18/19   Towanda Malkin, MD  ?gabapentin (NEURONTIN) 600 MG tablet 600 mg qAM, 600 mg qPM, 1200 mg QHS 01/07/21   Gillis Santa, MD  ?glucose blood (ACCU-CHEK GUIDE) test strip Use as instructed to check blood sugar 4 times per day Dx code E11.9 05/27/20   Elayne Snare, MD  ?hydrALAZINE (APRESOLINE) 50 MG tablet Take 1 tablet by mouth 3 (three) times daily. 01/31/21    [provider]  ?hydrochlorothiazide (HYDRODIURIL) 25 MG tablet Take 25 mg by mouth daily. 01/15/21   [provider]  ?Insulin Disposable Pump (OMNIPOD DASH PODS, GEN 4,) MISC Change pod

## 2021-04-23 NOTE — Assessment & Plan Note (Addendum)
Resolved. Suspect her abdominal discomfort may be more from constipation.   ?She had 2 big bowel movements.  Nausea much improved. ?

## 2021-04-23 NOTE — Assessment & Plan Note (Addendum)
Likely prerenal secondary to nausea and vomiting and volume depletion. ?AKI improved with IV hydration. ? ?

## 2021-04-24 ENCOUNTER — Inpatient Hospital Stay: Payer: Medicare Other

## 2021-04-24 DIAGNOSIS — I5032 Chronic diastolic (congestive) heart failure: Secondary | ICD-10-CM | POA: Diagnosis not present

## 2021-04-24 DIAGNOSIS — Z6841 Body Mass Index (BMI) 40.0 and over, adult: Secondary | ICD-10-CM

## 2021-04-24 DIAGNOSIS — N179 Acute kidney failure, unspecified: Secondary | ICD-10-CM | POA: Diagnosis not present

## 2021-04-24 DIAGNOSIS — K56609 Unspecified intestinal obstruction, unspecified as to partial versus complete obstruction: Secondary | ICD-10-CM | POA: Diagnosis not present

## 2021-04-24 DIAGNOSIS — G4733 Obstructive sleep apnea (adult) (pediatric): Secondary | ICD-10-CM

## 2021-04-24 LAB — BASIC METABOLIC PANEL
Anion gap: 9 (ref 5–15)
BUN: 32 mg/dL — ABNORMAL HIGH (ref 6–20)
CO2: 29 mmol/L (ref 22–32)
Calcium: 8 mg/dL — ABNORMAL LOW (ref 8.9–10.3)
Chloride: 105 mmol/L (ref 98–111)
Creatinine, Ser: 1.53 mg/dL — ABNORMAL HIGH (ref 0.44–1.00)
GFR, Estimated: 40 mL/min — ABNORMAL LOW (ref >60)
Glucose, Bld: 134 mg/dL — ABNORMAL HIGH (ref 70–99)
Potassium: 3.8 mmol/L (ref 3.5–5.1)
Sodium: 143 mmol/L (ref 135–145)

## 2021-04-24 LAB — CBC
HCT: 39.3 % (ref 36.0–46.0)
Hemoglobin: 12 g/dL (ref 12.0–15.0)
MCH: 28.2 pg (ref 26.0–34.0)
MCHC: 30.5 g/dL (ref 30.0–36.0)
MCV: 92.5 fL (ref 80.0–100.0)
Platelets: 295 10*3/uL (ref 150–400)
RBC: 4.25 MIL/uL (ref 3.87–5.11)
RDW: 15 % (ref 11.5–15.5)
WBC: 8.6 10*3/uL (ref 4.0–10.5)
nRBC: 0 % (ref 0.0–0.2)

## 2021-04-24 LAB — MAGNESIUM: Magnesium: 2.4 mg/dL (ref 1.7–2.4)

## 2021-04-24 MED ORDER — MORPHINE SULFATE (PF) 2 MG/ML IV SOLN
2.0000 mg | INTRAVENOUS | Status: DC | PRN
  Administered 2021-04-25 – 2021-04-27 (×5): 2 mg via INTRAVENOUS
  Filled 2021-04-24 (×5): qty 1

## 2021-04-24 MED ORDER — DIATRIZOATE MEGLUMINE & SODIUM 66-10 % PO SOLN
90.0000 mL | Freq: Once | ORAL | Status: AC
  Administered 2021-04-24: 90 mL via NASOGASTRIC

## 2021-04-24 MED ORDER — COVID-19MRNA BIVAL VACC PFIZER 30 MCG/0.3ML IM SUSP
0.3000 mL | Freq: Once | INTRAMUSCULAR | Status: AC
  Administered 2021-04-25: 0.3 mL via INTRAMUSCULAR
  Filled 2021-04-24: qty 0.3

## 2021-04-24 MED ORDER — MORPHINE SULFATE (PF) 2 MG/ML IV SOLN
2.0000 mg | INTRAVENOUS | Status: DC | PRN
  Administered 2021-04-24: 2 mg via INTRAVENOUS
  Filled 2021-04-24: qty 1

## 2021-04-24 NOTE — Progress Notes (Signed)
[facility] ?SURGICAL PROGRESS NOTE (cpt [phone]) ? ?Hospital Day(s): 1.  ? ?Interval History: Patient seen and examined, no acute events or new complaints overnight. Patient reports she feels somewhat better. Still with abdominal discomfort. NGT had to be replaced yesterday evening. No fever, chills, emesis. NGT output recorded at 1000 ccs in last 24 hours. She remains without leukocytosis; WBC 8.6K. Hgb stable at 12.0. Still with AKI; sCr - 1.53; UO - 1.0L. No significant electrolyte derangements. KUB is pending this morning.  ? ?Review of Systems:  ?Constitutional: denies fever, chills  ?HEENT: denies cough or congestion  ?Respiratory: denies any shortness of breath  ?Cardiovascular: denies chest pain or palpitations  ?Gastrointestinal: + abdominal pain (improving some), denied N/V/D ?Genitourinary: denies burning with urination or urinary frequency ?Musculoskeletal: denies pain, decreased motor or sensation ? ?Vital signs in last 24 hours: [min-max] current  ?Temp:  [97.3 ?F (36.3 ?C)-98.2 ?F (36.8 ?C)] 98.1 ?F (36.7 ?C) (04/06 0445) ?Pulse Rate:  [87-99] 87 (04/06 0445) ?Resp:  [16-20] 20 (04/06 0445) ?BP: (112-142)/(57-70) 120/62 (04/06 0445) ?SpO2:  [93 %-100 %] 100 % (04/06 0445)     Height: 5' 6"  (167.6 cm) Weight: 117 kg BMI (Calculated): 41.66  ? ?Intake/Output last 2 shifts:  ?04/05 0701 - 04/06 0700 ?In: 1025.7 [I.V.:925.7; NG/GT:100] ?Out: 2000 [Urine:1000; Emesis/NG output:1000]  ? ?Physical Exam:  ?Constitutional: alert, cooperative and no distress  ?HENT: normocephalic without obvious abnormality  ?Eyes: PERRL, EOM's grossly intact and symmetric  ?Respiratory: breathing non-labored at rest  ?Cardiovascular: regular rate and sinus rhythm  ?Gastrointestinal: Obese, soft, central abdominal soreness, distension is difficult to ascertain given body habitus, no rebound/guarding. She is certainly without peritonitis  ? ? ?Labs:  ? ?  Latest Ref Rng & Units 04/24/2021  ?  5:27 AM 04/23/2021  ?   5:53 AM 04/22/2021  ?  8:29 PM  ?CBC  ?WBC 4.0 - 10.5 K/uL 8.6   12.8   12.5    ?Hemoglobin 12.0 - 15.0 g/dL 12.0   13.4   16.0    ?Hematocrit 36.0 - 46.0 % 39.3   42.7   49.8    ?Platelets 150 - 400 K/uL 295   327   405    ? ? ?  Latest Ref Rng & Units 04/24/2021  ?  5:27 AM 04/23/2021  ?  5:53 AM 04/22/2021  ?  8:29 PM  ?CMP  ?Glucose 70 - 99 mg/dL 134   180   195    ?BUN 6 - 20 mg/dL 32   27   27    ?Creatinine 0.44 - 1.00 mg/dL 1.53   1.49   1.56    ?Sodium 135 - 145 mmol/L 143   140   137    ?Potassium 3.5 - 5.1 mmol/L 3.8   4.0   4.0    ?Chloride 98 - 111 mmol/L 105   99   95    ?CO2 22 - 32 mmol/L 29   30   26     ?Calcium 8.9 - 10.3 mg/dL 8.0   8.6   10.6    ?Total Protein 6.5 - 8.1 g/dL   10.0    ?Total Bilirubin 0.3 - 1.2 mg/dL   0.6    ?Alkaline Phos 38 - 126 U/L   97    ?AST 15 - 41 U/L   22    ?ALT 0 - 44 U/L   26    ? ? ? ?Imaging studies: No new pertinent imaging  studies; KUB pending  ? ? ?Assessment/Plan: (ICD-10's: K56.609) ?56 y.o. female with abdominal pain, nausea, emesis found to likely have small bowel obstruction secondary to adhesive disease in setting of multiple abdominal surgeries. ? ?- Continue NGT decompression to LIS; monitor and record output ?- No emergent surgical intervention. She understands that we will attempt to manage this conservatively. However, should this fail, she may need laparotomy and lysis of adhesions.   ?           - NPO + IVF resuscitation ?- Monitor abdominal examination; on-going bowel function ?- Serial KUBs; pending this morning. If no improvement, may go 24-Hr gastrografin challenge  ?- Pain control prn; antiemetics prn  ?           - Mobilization as tolerated ?           - Further management per primary service; we will follow  ?  ?All of the above findings and recommendations were discussed with the patient, and all of patient's questions were answered to her expressed satisfaction. ? ?-- ?Edison Simon, [name] ?[facility] ?04/24/2021, 7:46  AM ?[phone] ?M-F: 7am - 4pm ? ?

## 2021-04-24 NOTE — Progress Notes (Addendum)
[facility] Progress Note ? ?Patient: [name]    UUE:[id number]  DOA: 04/22/2021    ?Date of Service: the patient was seen and examined on 04/24/2021 ? ?Brief hospital course: ?56 year old female with past medical history that includes hypertension, diabetes, diastolic heart failure, COPD and previous CVA on Plavix who presented to the emergency room on night of 4/4 with 1 day of abdominal pain along with nausea and vomiting and work-up revealed small bowel obstruction.  Patient had NG tube placed and admitted to the hospitalist service.  General surgery consulted who recommended initial conservative measures. ? ?By 4/6, abdominal x-ray notes improvement in presentation and evidence of multiple areas of stool.  Patient still not having any flatus. ? ?Assessment and Plan: ?Assessment and Plan: ?SBO (small bowel obstruction) ([place]) ?Stable.  NG tube placed and general surgery following.  Conservative measures.  X-ray from 4/6 notes improvement with areas of constipation.  No flatus.  For Gastrografin challenge. ? ?AKI (acute kidney injury) ([place]) ?- This is prerenal secondary to recurrent nausea and vomiting and volume depletion.  Some improvement with IV fluids although also with NG drainage, renal function today about the same ? ?Hypertensive urgency ?- We will continue antihypertensives and place her on as needed IV labetalol.  Stable.  Systolic between 915A to 569V. ? ?Chronic diastolic CHF (congestive heart failure) ([place]) ?Echocardiogram in 2017 noted grade 1 diastolic dysfunction.  Patient came in dry and is getting IV fluids.  Monitor input and output.  We will check BNP once she is more euvolemic and closer to discharge. ? ?Seizure disorder ([place]) ?- We will continue Keppra. ? ?Type 2 diabetes mellitus with diabetic neuropathy ([place]) ?- We will place on supplement coverage with NovoLog. ?- We will continue her basal coverage. ?- We will continue her Neurontin for her diabetic  neuropathy. ? ?Dyslipidemia ?- We will continue statin therapy. ? ?GERD without esophagitis ?- We will continue her PPI therapy. ? ?Class 3 severe obesity with serious comorbidity and body mass index (BMI) of 40.0 to 44.9 in adult [facility]) ?Patient meets criteria for morbid obesity with BMI greater than 40 ? ?OSA (obstructive sleep apnea) ?Using home CPAP ? ? ? ? ? ? ?Body mass index is 41.64 kg/m?.  ?  ?   ? ?Consultants: ?General surgery ? ?Procedures: ?NG tube placement ? ?Antimicrobials: ?None ? ?Code Status: Full code ? ? ?Subjective: Patient feels about the same in terms of abdominal discomfort ?Objective: ?Noted mildly elevated blood pressures ?Vitals:  ? 04/24/21 0445 04/24/21 0839  ?BP: 120/62 (!) 143/70  ?Pulse: 87 82  ?Resp: 20 18  ?Temp: 98.1 ?F (36.7 ?C) 98.8 ?F (37.1 ?C)  ?SpO2: 100% 96%  ? ? ?Intake/Output Summary (Last 24 hours) at 04/24/2021 1500 ?Last data filed at 04/24/2021 0615 ?Gross per 24 hour  ?Intake 1025.71 ml  ?Output 1000 ml  ?Net 25.71 ml  ? ?Filed Weights  ? 04/22/21 2019  ?Weight: 117 kg  ? ?Body mass index is 41.64 kg/m?. ? ?Exam: ? ?General: Alert and oriented x3, no acute distress ?HEENT: Normocephalic, atraumatic, NG tube in place, mucous membranes are slightly dry ?Cardiovascular: Regular rate and rhythm, S1-S2 ?Respiratory: Clear to auscultation bilaterally ?Abdomen: Soft, minimal tenderness generalized, mildly distended, scant bowel sounds ?Musculoskeletal: No clubbing or cyanosis, trace pitting edema ?Skin: No skin breaks, tears or lesions ?Psychiatry: Appropriate, no evidence of psychoses ?Neurology: No focal deficits ? ?Data Reviewed: ?Creatinine today at 1.53 with GFR 40.  White count normalized ? ?Disposition:  ?  Status is: Inpatient ?Remains inpatient appropriate because: Continued small bowel obstruction ?  ? ?Anticipated discharge date: 4/8 ? ?Remaining issues to be resolved so that patient can be discharged: Resolution of small bowel obstruction ? ? ?Family Communication: Left  message for daughter  ? ?DVT Prophylaxis: ? ?  Lovenox ? ? ?Author: ?Annita Brod ,MD ?04/24/2021 3:00 PM ? ?To reach On-call, see care teams to locate the attending and reach out via [email or web address]. ?Between 7PM-7AM, please contact night-coverage ?If you still have difficulty reaching the attending provider, please page the [facility] (Director on Call) for [facility] on amion for assistance. ? ?

## 2021-04-24 NOTE — Assessment & Plan Note (Addendum)
Continue home CPAP ?

## 2021-04-24 NOTE — TOC Initial Note (Addendum)
Transition of Care (TOC) - Initial/Assessment Note  ? ? ?Patient Details  ?Name: [name] ?MRN: [id number] ?Date of Birth: 02/17/1965 ? ?Transition of Care (TOC) CM/SW Contact:    ?Laurena Slimmer, RN ?Phone Number: ?04/24/2021, 12:37 PM ? ?Clinical Narrative:                 ?Spoke with patient at bedside ?Patient lives with daughter and son-in-law.  ?Daughter will transport at discharge ?Reports she has active service with [facility]  with PT/OT was last seen the Monday prior to her admission. Prefers to remain active with current service. Confirmed with Corene Cornea from [place] she is active on services with PT/OT/SW. Will need resumption order at discharge.  ? ?Admitted for: SBO ?Admitted from: Home  ?PCP: Verline Lema ?Pharmacy: [facility]. ?Current home health/prior home health/DME:[facility], [place], [place], [place] ? ? ?Expected Discharge Plan: [place] ?Barriers to Discharge: Continued Medical Work up ? ? ?Patient Goals and CMS Choice ?Patient states their goals for this hospitalization and ongoing recovery are:: To get stronger ?  ?Choice offered to / list presented to : Patient ? ?Expected Discharge Plan and Services ?Expected Discharge Plan: [place] ?  ?Discharge Planning Services: CM Consult ?Post Acute Care Choice: Home Health ?Living arrangements for the past 2 months: [place]  ?                ?  ?  ?  ?  ?  ?  ?  ?  ?  ?  ? ?Prior Living Arrangements/Services ?Living arrangements for the past 2 months: [place] ?Lives with:: Adult Children ?Patient language and need for interpreter reviewed:: Yes ?Do you feel safe going back to the place where you live?: Yes      ?Need for Family Participation in Patient Care: No (Comment) ?Care giver support system in place?: Yes (comment) ?Current home services: Home OT, Home PT, DME ?Criminal Activity/Legal Involvement Pertinent to Current Situation/Hospitalization: No - Comment as needed ? ?Activities of  Daily Living ?Home Assistive Devices/Equipment: Cane (specify quad or straight) ?ADL Screening (condition at time of admission) ?Patient's cognitive ability adequate to safely complete daily activities?: Yes ?Is the patient deaf or have difficulty hearing?: No ?Does the patient have difficulty seeing, even when wearing glasses/contacts?: No ?Does the patient have difficulty concentrating, remembering, or making decisions?: No ?Patient able to express need for assistance with ADLs?: Yes ?Does the patient have difficulty dressing or bathing?: No ?Independently performs ADLs?: Yes (appropriate for developmental age) ?Does the patient have difficulty walking or climbing stairs?: Yes ?Weakness of Legs: Both ?Weakness of Arms/Hands: None ? ?Permission Sought/Granted ?  ?  ?   ?   ?   ?   ? ?Emotional Assessment ?Appearance:: Appears stated age ?  ?  ?Orientation: : Oriented to Self, Oriented to Place, Oriented to  Time, Oriented to Situation ?Alcohol / Substance Use: Not Applicable ?Psych Involvement: No (comment) ? ?Admission diagnosis:  SBO (small bowel obstruction) ([place]) [K56.609] ?Generalized abdominal pain [R10.84] ?AKI (acute kidney injury) ([place]) [N17.9] ?Patient Active Problem List  ? Diagnosis Date Noted  ? SBO (small bowel obstruction) ([place]) 04/23/2021  ? Hypertensive urgency 04/23/2021  ? GERD without esophagitis 04/23/2021  ? AKI (acute kidney injury) ([place]) 04/23/2021  ? Type 2 diabetes mellitus with diabetic neuropathy ([place]) 04/23/2021  ? Dyslipidemia 04/23/2021  ? Chronic diastolic CHF (congestive heart failure) ([place]) 04/23/2021  ? Cervical spondylosis 02/12/2021  ?  Lumbar facet arthropathy 02/12/2021  ? Recurrent UTI 11/13/2020  ? Angina pectoris ([place]) 11/13/2020  ? Major depressive disorder, recurrent episode, moderate ([place]) 11/13/2020  ? Weakness of right arm   ? Adrenal adenoma, right   ? PAD (peripheral artery disease) ([place]) 01/24/2020  ? Abnormal ankle brachial index (ABI) 12/18/2019  ? CAD (coronary  artery disease) 04/27/2019  ? Myalgia 04/19/2019  ? Diabetic polyneuropathy associated with type 2 diabetes mellitus ([place]) 03/02/2018  ? Vitamin D deficiency 01/24/2018  ? Overactive bladder 05/18/2017  ? Dilated aortic root ([place])   ? Sacroiliac dysfunction 10/09/2014  ? Chronic low back pain 09/03/2014  ? CVA, old, hemiparesis ([place]) 09/03/2014  ? OSA (obstructive sleep apnea) 05/15/2014  ? Type 2 diabetes mellitus with chronic kidney disease, with long-term current use of insulin ([place]) 01/21/2014  ? Chronic pain syndrome 01/21/2014  ? Headache disorder 09/07/2013  ? Mixed urge and stress incontinence 08/25/2011  ? Ureteral stricture, right 08/25/2011  ? Pyelonephritis 08/22/2011  ? Nephrolithiasis 08/22/2011  ? Incisional hernia 05/10/2011  ? Class 3 severe obesity with serious comorbidity and body mass index (BMI) of 40.0 to 44.9 in adult [facility]) 05/10/2011  ? BLINDNESS, [place], [place] DEFINITION 09/14/2006  ? Hyperlipemia 09/13/2006  ? Accelerated hypertension 09/13/2006  ? GERD 09/13/2006  ? Seizure disorder ([place]) 09/13/2006  ? ?PCP:  Delsa Grana, [name] ?Pharmacy:   ?[id number] [place]?Phone: [phone]: [phone]? ?[facility] [place] [place]?[place] [address]?Phone: [phone]: [phone]? ? ? ? ?Social Determinants of Health (SDOH) Interventions ?  ? ?Readmission Risk Interventions ?   ? View : No data to display.  ?  ?  ?  ? ? ? ?

## 2021-04-24 NOTE — Progress Notes (Signed)
Patient has Home CPAP unit at bedside. Per protocol, unit plugged into red outlet and observed to be in proper working condition, no loose or frayed cords.  ?

## 2021-04-25 ENCOUNTER — Inpatient Hospital Stay: Payer: Medicare Other

## 2021-04-25 DIAGNOSIS — K56609 Unspecified intestinal obstruction, unspecified as to partial versus complete obstruction: Secondary | ICD-10-CM | POA: Diagnosis not present

## 2021-04-25 DIAGNOSIS — N179 Acute kidney failure, unspecified: Secondary | ICD-10-CM | POA: Diagnosis not present

## 2021-04-25 DIAGNOSIS — I5032 Chronic diastolic (congestive) heart failure: Secondary | ICD-10-CM | POA: Diagnosis not present

## 2021-04-25 MED ORDER — PANTOPRAZOLE SODIUM 40 MG IV SOLR
40.0000 mg | INTRAVENOUS | Status: DC
  Administered 2021-04-25 – 2021-05-01 (×7): 40 mg via INTRAVENOUS
  Filled 2021-04-25 (×7): qty 10

## 2021-04-25 NOTE — Progress Notes (Signed)
[facility] ?SURGICAL PROGRESS NOTE (cpt [id number]) ? ?Hospital Day(s): 2.  ? ?Interval History: Patient seen and examined, no acute events or new complaints overnight. Patient reports she feels somewhat better. Still with abdominal discomfort. NGT had to be replaced yesterday evening. No fever, chills, emesis. NGT output recorded at 1000 ccs in last 24 hours. She remains without leukocytosis; WBC 8.6K. Hgb stable at 12.0. Still with AKI; sCr - 1.53; UO - 1.0L. No significant electrolyte derangements. KUB is pending this morning.  ? ?Review of Systems:  ?Constitutional: denies fever, chills  ?HEENT: denies cough or congestion  ?Respiratory: denies any shortness of breath  ?Cardiovascular: denies chest pain or palpitations  ?Gastrointestinal: + abdominal pain (improving some), denied N/V/D ?Genitourinary: denies burning with urination or urinary frequency ?Musculoskeletal: denies pain, decreased motor or sensation ? ?Vital signs in last 24 hours: [min-max] current  ?Temp:  [97.7 ?F (36.5 ?C)-98.9 ?F (37.2 ?C)] 97.7 ?F (36.5 ?C) (04/07 0201) ?Pulse Rate:  [80-85] 85 (04/07 0201) ?Resp:  [18-20] 20 (04/07 0201) ?BP: (124-153)/(68-85) 129/85 (04/07 0201) ?SpO2:  [96 %-100 %] 98 % (04/07 0201)     Height: 5' 6"  (167.6 cm) Weight: 117 kg BMI (Calculated): 41.66  ? ?Intake/Output last 2 shifts:  ?04/06 0701 - 04/07 0700 ?In: -  ?Out: 500 [Emesis/NG output:500]  ? ?Physical Exam:  ?Constitutional: alert, cooperative and no distress  ?HENT: normocephalic without obvious abnormality  ?Eyes: PERRL, EOM's grossly intact and symmetric  ?Respiratory: breathing non-labored at rest  ?Cardiovascular: regular rate and sinus rhythm  ?Gastrointestinal: Obese, soft, central abdominal soreness, distension is difficult to ascertain given body habitus, no rebound/guarding. She is certainly without peritonitis  ? ? ?Labs:  ? ?  Latest Ref Rng & Units 04/24/2021  ?  5:27 AM 04/23/2021  ?  5:53 AM 04/22/2021  ?  8:29 PM  ?CBC  ?WBC 4.0  - 10.5 K/uL 8.6   12.8   12.5    ?Hemoglobin 12.0 - 15.0 g/dL 12.0   13.4   16.0    ?Hematocrit 36.0 - 46.0 % 39.3   42.7   49.8    ?Platelets 150 - 400 K/uL 295   327   405    ? ? ?  Latest Ref Rng & Units 04/24/2021  ?  5:27 AM 04/23/2021  ?  5:53 AM 04/22/2021  ?  8:29 PM  ?CMP  ?Glucose 70 - 99 mg/dL 134   180   195    ?BUN 6 - 20 mg/dL 32   27   27    ?Creatinine 0.44 - 1.00 mg/dL 1.53   1.49   1.56    ?Sodium 135 - 145 mmol/L 143   140   137    ?Potassium 3.5 - 5.1 mmol/L 3.8   4.0   4.0    ?Chloride 98 - 111 mmol/L 105   99   95    ?CO2 22 - 32 mmol/L 29   30   26     ?Calcium 8.9 - 10.3 mg/dL 8.0   8.6   10.6    ?Total Protein 6.5 - 8.1 g/dL   10.0    ?Total Bilirubin 0.3 - 1.2 mg/dL   0.6    ?Alkaline Phos 38 - 126 U/L   97    ?AST 15 - 41 U/L   22    ?ALT 0 - 44 U/L   26    ? ? ? ?Imaging studies:  ? ?KUB w/ gastrografin 8-Hr  delay (04/24/2021) personally reviewed with contrast reaching the ascending colon, and radiologist report reviewed below:  ?IMPRESSION: ?Contrast is now seen in the colon consistent with a partial small ?bowel obstruction. The degree of small-bowel dilatation has improved ?somewhat in the interval from the prior CT. ? ?KUB w/ gastrografin 24-Hr delay (04/25/2021) pending ? ? ? ?Assessment/Plan: (ICD-10's: K56.609) ?56 y.o. female with abdominal pain, nausea, emesis found to likely have small bowel obstruction secondary to adhesive disease in setting of multiple abdominal surgeries. ? ?- She has had evidence of radiographic improvement in her KUB this morning with contrast in the ascending colon; however, she continues to not have any reports of flatus and NGT output still somewhat feculent appearing. I will await 24 hr delay KUB this morning. She understands that she may still need surgical intervention, potentially today.  ? ?           - NPO + IVF resuscitation ?- Monitor abdominal examination; on-going bowel function ?- Serial KUBs; pending this morning. If no improvement, may go 24-Hr  gastrografin challenge  ?- Pain control prn; antiemetics prn  ?           - Mobilization as tolerated ?           - Further management per primary service; we will follow  ?  ?All of the above findings and recommendations were discussed with the patient, and all of patient's questions were answered to her expressed satisfaction. ? ?-- ?Edison Simon, [name] ?[facility] ?04/25/2021, 7:34 AM ?[phone] ?M-F: 7am - 4pm ? ?

## 2021-04-25 NOTE — Care Management Important Message (Signed)
Important Message ? ?Patient Details  ?Name: [name] ?MRN: [id number] ?Date of Birth: [birth year] ? ? ?Medicare Important Message Given:  Yes ? ? ? ? ?Dannette Barbara ?04/25/2021, 12:18 PM ?

## 2021-04-25 NOTE — Progress Notes (Signed)
[facility] Progress Note ? ?Patient: [name]    GEX:[id number]  DOA: 04/22/2021    ?Date of Service: the patient was seen and examined on 04/25/2021 ? ?Brief hospital course: ?56 year old female with past medical history that includes hypertension, diabetes, diastolic heart failure, COPD and previous CVA on Plavix who presented to the emergency room on night of 4/4 with 1 day of abdominal pain along with nausea and vomiting and work-up revealed small bowel obstruction.  Patient had NG tube placed and admitted to the hospitalist service.  General surgery consulted who recommended initial conservative measures. ? ?Subsequent abdominal x-rays note IV contrast has progressed to rectum.  Still noting some distended bowel loops. ? ? ?Assessment and Plan: ?Assessment and Plan: ?SBO (small bowel obstruction) ([place]) ?Stable.  NG tube placed and general surgery following.  Conservative measures.  X-ray from 4/7 notes distended bowel loops, but contrast has progressed to rectum.  Patient still with no flatus.  Trial of NG clamping underway. ? ?AKI (acute kidney injury) ([place]) ?- This is prerenal secondary to recurrent nausea and vomiting and volume depletion.  Some improvement with IV fluids.  Recheck in the morning. ? ?Hypertensive urgency ?- We will continue antihypertensives and place her on as needed IV labetalol.  Stable.  ? ?Chronic diastolic CHF (congestive heart failure) ([place]) ?Echocardiogram in 2017 noted grade 1 diastolic dysfunction.  Patient came in dry and is getting IV fluids.  Monitor input and output.  We will check BNP in the morning. ? ?Seizure disorder ([place]) ?- We will continue Keppra. ? ?Type 2 diabetes mellitus with diabetic neuropathy ([place]) ?- We will place on supplement coverage with NovoLog. ?- We will continue her basal coverage. ?- We will continue her Neurontin for her diabetic neuropathy. ? ?Dyslipidemia ?- We will continue statin therapy. ? ?GERD without esophagitis ?- We will  continue her PPI therapy. ? ?Class 3 severe obesity with serious comorbidity and body mass index (BMI) of 40.0 to 44.9 in adult [facility]) ?Patient meets criteria for morbid obesity with BMI greater than 40 ? ?OSA (obstructive sleep apnea) ?Using home CPAP ? ? ? ? ? ? ?Body mass index is 41.64 kg/m?.  ?  ?   ? ?Consultants: ?General surgery ? ?Procedures: ?NG tube placement ? ?Antimicrobials: ?None ? ?Code Status: Full code ? ? ?Subjective: Patient with continued abdominal distention/discomfort. ?Objective: ?Vitals reviewed ?Vitals:  ? 04/25/21 0201 04/25/21 0834  ?BP: 129/85 134/89  ?Pulse: 85 81  ?Resp: 20 19  ?Temp: 97.7 ?F (36.5 ?C) 98.1 ?F (36.7 ?C)  ?SpO2: 98% 98%  ? ? ?Intake/Output Summary (Last 24 hours) at 04/25/2021 1246 ?Last data filed at 04/25/2021 0102 ?Gross per 24 hour  ?Intake --  ?Output 1100 ml  ?Net -1100 ml  ? ? ?Filed Weights  ? 04/22/21 2019  ?Weight: 117 kg  ? ?Body mass index is 41.64 kg/m?. ? ?Exam: ? ?General: Alert and oriented x3, no acute distress ?HEENT: Normocephalic, atraumatic, NG tube in place, mucous membranes are slightly dry ?Cardiovascular: Regular rate and rhythm, S1-S2 ?Respiratory: Clear to auscultation bilaterally ?Abdomen: Soft, minimal tenderness generalized, mildly distended, few bowel sounds musculoskeletal: No clubbing or cyanosis, trace pitting edema ?Skin: No skin breaks, tears or lesions ?Psychiatry: Appropriate, no evidence of psychoses ?Neurology: No focal deficits ? ?Data Reviewed: ?No labs for today. ? ?Disposition:  ?Status is: Inpatient ?Remains inpatient appropriate because: Continued small bowel obstruction ?  ? ?Anticipated discharge date: 4/9 ? ?Remaining issues to be resolved so that patient can  be discharged: Resolution of small bowel obstruction ? ? ?Family Communication: Left message for daughter  ? ?DVT Prophylaxis: ? ?  Lovenox ? ? ?Author: ?Annita Brod ,MD ?04/25/2021 12:46 PM ? ?To reach On-call, see care teams to locate the attending and reach out via  [email or web address]. ?Between 7PM-7AM, please contact night-coverage ?If you still have difficulty reaching the attending provider, please page the [facility] (Director on Call) for [facility] on amion for assistance. ? ?

## 2021-04-25 NOTE — Progress Notes (Signed)
Mobility Specialist - Progress Note ? ? 04/25/21 1300  ?Mobility  ?Activity Ambulated with assistance in hallway;Transferred to/from BSC;Stood at bedside;Dangled on edge of bed  ?Level of Assistance Standby assist, set-up cues, supervision of patient - no hands on  ?Assistive Device Front wheel walker  ?Distance Ambulated (ft) 100 ft  ?Activity Response Tolerated well  ?$Mobility charge 1 Mobility  ? ? ? ?During mobility: 88 HR, 96% SpO2 ? ?Pt supine upon arrival using RA. Pt completes bed mobility to sit EOB ModI and STS with supervision. Pt ambulates 165f SBA --- voices dizziness and LE weakness, 3 pt initiated breaks before requesting to return to room. [facility] returns [facility]. [facility]. ? ?MMerrily Brittle?Mobility Specialist ?04/25/21, 1:04 PM ? ? ? ?

## 2021-04-26 DIAGNOSIS — N179 Acute kidney failure, unspecified: Secondary | ICD-10-CM | POA: Diagnosis not present

## 2021-04-26 DIAGNOSIS — K56609 Unspecified intestinal obstruction, unspecified as to partial versus complete obstruction: Secondary | ICD-10-CM | POA: Diagnosis not present

## 2021-04-26 DIAGNOSIS — I5032 Chronic diastolic (congestive) heart failure: Secondary | ICD-10-CM | POA: Diagnosis not present

## 2021-04-26 LAB — BASIC METABOLIC PANEL
Anion gap: 8 (ref 5–15)
BUN: 24 mg/dL — ABNORMAL HIGH (ref 6–20)
CO2: 25 mmol/L (ref 22–32)
Calcium: 8.3 mg/dL — ABNORMAL LOW (ref 8.9–10.3)
Chloride: 107 mmol/L (ref 98–111)
Creatinine, Ser: 0.98 mg/dL (ref 0.44–1.00)
GFR, Estimated: >60 mL/min (ref >60)
Glucose, Bld: 152 mg/dL — ABNORMAL HIGH (ref 70–99)
Potassium: 4.5 mmol/L (ref 3.5–5.1)
Sodium: 140 mmol/L (ref 135–145)

## 2021-04-26 LAB — GLUCOSE, CAPILLARY
Glucose-Capillary: 146 mg/dL — ABNORMAL HIGH (ref 70–99)
Glucose-Capillary: 188 mg/dL — ABNORMAL HIGH (ref 70–99)

## 2021-04-26 LAB — BRAIN NATRIURETIC PEPTIDE: B Natriuretic Peptide: 276.7 pg/mL — ABNORMAL HIGH (ref 0.0–100.0)

## 2021-04-26 LAB — HEMOGLOBIN A1C
Hgb A1c MFr Bld: 7.5 % — ABNORMAL HIGH (ref 4.8–5.6)
Mean Plasma Glucose: 168.55 mg/dL

## 2021-04-26 MED ORDER — INSULIN ASPART 100 UNIT/ML IJ SOLN
0.0000 [IU] | Freq: Three times a day (TID) | INTRAMUSCULAR | Status: DC
  Administered 2021-04-26 – 2021-04-27 (×2): 1 [IU] via SUBCUTANEOUS
  Administered 2021-04-27 (×2): 2 [IU] via SUBCUTANEOUS
  Administered 2021-04-28: 1 [IU] via SUBCUTANEOUS
  Administered 2021-04-28: 2 [IU] via SUBCUTANEOUS
  Administered 2021-04-28: 1 [IU] via SUBCUTANEOUS
  Administered 2021-04-29: 3 [IU] via SUBCUTANEOUS
  Administered 2021-04-29 (×2): 2 [IU] via SUBCUTANEOUS
  Administered 2021-04-30: 3 [IU] via SUBCUTANEOUS
  Administered 2021-04-30: 2 [IU] via SUBCUTANEOUS
  Administered 2021-04-30: 5 [IU] via SUBCUTANEOUS
  Administered 2021-05-01: 2 [IU] via SUBCUTANEOUS
  Filled 2021-04-26 (×14): qty 1

## 2021-04-26 MED ORDER — INSULIN ASPART 100 UNIT/ML IJ SOLN
0.0000 [IU] | Freq: Every day | INTRAMUSCULAR | Status: DC
  Administered 2021-04-29: 2 [IU] via SUBCUTANEOUS
  Filled 2021-04-26: qty 1

## 2021-04-26 MED ORDER — FUROSEMIDE 10 MG/ML IJ SOLN
20.0000 mg | Freq: Once | INTRAMUSCULAR | Status: AC
  Administered 2021-04-26: 20 mg via INTRAVENOUS
  Filled 2021-04-26: qty 4

## 2021-04-26 MED ORDER — HYDROCODONE-ACETAMINOPHEN 5-325 MG PO TABS
1.0000 | ORAL_TABLET | Freq: Four times a day (QID) | ORAL | Status: DC | PRN
  Administered 2021-04-26 – 2021-04-30 (×6): 1 via ORAL
  Filled 2021-04-26 (×6): qty 1

## 2021-04-26 NOTE — Progress Notes (Signed)
[facility] Progress Note ? ?Patient: [name]    ZTI:[id number]  DOA: 04/22/2021    ?Date of Service: the patient was seen and examined on 04/26/2021 ? ?Brief hospital course: ?56 year old female with past medical history that includes hypertension, diabetes, diastolic heart failure, COPD and previous CVA on Plavix who presented to the emergency room on night of 4/4 with 1 day of abdominal pain along with nausea and vomiting and work-up revealed small bowel obstruction.  Patient had NG tube placed and admitted to the hospitalist service.  General surgery consulted who recommended initial conservative measures. ? ?Over the next few days, bowel obstruction slowly improving.  Patient able to have NG tube removed on evening of 4/7 and started on clear liquids which she is tolerating.  Diet advanced to full liquids at 4/8. ? ?Assessment and Plan: ?Assessment and Plan: ?SBO (small bowel obstruction) ([place]) ?Stable.  NG tube placed and general surgery following.  Conservative measures.  Resolving.  NG tube removed and started on clears.  Diet advanced to full's. ? ?AKI (acute kidney injury) ([place]) ?- This is prerenal secondary to recurrent nausea and vomiting and volume depletion.  Resolved with IV fluids. ? ?Hypertensive urgency ?- We will continue antihypertensives and place her on as needed IV labetalol.  Stable.  ? ?Chronic diastolic CHF (congestive heart failure) ([place]) ?Echocardiogram in 2017 noted grade 1 diastolic dysfunction.  Patient came in dry and is getting IV fluids.  Monitor input and output.  Minimally elevated BNP on 4/8 of 276.  Given 1 dose of IV Lasix. ? ?Seizure disorder ([place]) ?- We will continue Keppra. ? ?Type 2 diabetes mellitus with diabetic neuropathy ([place]) ?That she is eating but on sensitive sliding scale. ?- We will continue her Neurontin for her diabetic neuropathy. ? ?Dyslipidemia ?- We will continue statin therapy. ? ?GERD without esophagitis ?- We will continue her PPI  therapy. ? ?Class 3 severe obesity with serious comorbidity and body mass index (BMI) of 40.0 to 44.9 in adult [facility]) ?Patient meets criteria for morbid obesity with BMI greater than 40 ? ?OSA (obstructive sleep apnea) ?Using home CPAP ? ? ? ? ? ? ?Body mass index is 41.64 kg/m?.  ?  ?   ? ?Consultants: ?General surgery ? ?Procedures: ?NG tube placement, now removed ? ?Antimicrobials: ?None ? ?Code Status: Full code ? ? ?Subjective: Mild abdominal distention, but better. ?Objective: ?Vitals reviewed ?Vitals:  ? 04/26/21 1143 04/26/21 1513  ?BP: 103/64 117/68  ?Pulse: 67 69  ?Resp:  18  ?Temp:  (!) 97.5 ?F (36.4 ?C)  ?SpO2: 99% 100%  ? ? ?Intake/Output Summary (Last 24 hours) at 04/26/2021 1555 ?Last data filed at 04/26/2021 1500 ?Gross per 24 hour  ?Intake 1140 ml  ?Output 400 ml  ?Net 740 ml  ? ? ?Filed Weights  ? 04/22/21 2019  ?Weight: 117 kg  ? ?Body mass index is 41.64 kg/m?. ? ?Exam: ? ?General: Alert and oriented x3, no acute distress ?HEENT: Normocephalic, atraumatic, NG tube in place, mucous membranes are slightly dry ?Cardiovascular: Regular rate and rhythm, S1-S2 ?Respiratory: Clear to auscultation bilaterally ?Abdomen: Soft, minimal tenderness generalized, mildly distended, few bowel sounds ?Musculoskeletal: No clubbing or cyanosis, trace pitting edema ?Skin: No skin breaks, tears or lesions ?Psychiatry: Appropriate, no evidence of psychoses ?Neurology: No focal deficits ? ?Data Reviewed: ?No labs for today. ? ?Disposition:  ?Status is: Inpatient ?Remains inpatient appropriate because: Continued small bowel obstruction ?  ? ?Anticipated discharge date: 4/9 ? ?Remaining issues to be resolved  so that patient can be discharged: Tolerating solid food. ? ? ?Family Communication: Left message for daughter  ? ?DVT Prophylaxis: ? ?  Lovenox ? ? ?Author: ?Annita Brod ,MD ?04/26/2021 3:55 PM ? ?To reach On-call, see care teams to locate the attending and reach out via [email or web address]. ?Between 7PM-7AM, please contact  night-coverage ?If you still have difficulty reaching the attending provider, please page the [facility] (Director on Call) for [facility] on amion for assistance. ? ?

## 2021-04-26 NOTE — Progress Notes (Signed)
Mobility Specialist - Progress Note ? ? ? 04/26/21 1300  ?Mobility  ?Activity Ambulated with assistance in hallway;Stood at bedside;Dangled on edge of bed  ?Level of Assistance Modified independent, requires aide device or extra time  ?Assistive Device Front wheel walker  ?Distance Ambulated (ft) 160 ft  ?Activity Response Tolerated well  ?$Mobility charge 1 Mobility  ? ? ? ?Pt supine upon arrival using RA. Pt completes bed mobility to sit EOB and STS with ModI. Pt ambulates 167f with supervision --- one pt initiated break d/t voiced 8/10 dizziness, once dizziness resided pt continued ambulation with no complaints. Tolerated session well and much improvement this date. Pt returns to bed with needs in reach. ? ?MMerrily Calderon?Mobility Specialist ?04/26/21, 1:15 PM ? ? ? ? ? ?

## 2021-04-26 NOTE — Plan of Care (Signed)
?  Problem: Elimination: ?Goal: Will not experience complications related to bowel motility ?Outcome: Not Progressing ?  ?Problem: Pain Managment: ?Goal: General experience of comfort will improve ?Outcome: Not Progressing ?  ?Problem: Safety: ?Goal: Ability to remain free from injury will improve ?Outcome: Not Progressing ?  ?

## 2021-04-26 NOTE — Progress Notes (Signed)
Patient ID: [name], female   DOB: April 12, 1965, 56 y.o.   MRN: [id number] ?    SURGICAL PROGRESS NOTE  ? ?Hospital Day(s): 3.  ? ?Interval History: Patient seen and examined, no acute events or new complaints overnight. Patient reports feeling better this morning.  He endorses that the mid abdominal pain is slowly decreasing.  She denies any nausea or vomiting.  She endorses having a bowel movement yesterday.  Patient has chronic back pain that this makes with the abdominal pain.  No pain radiation.  No alleviating or aggravating factors. ? ?Vital signs in last 24 hours: [min-max] current  ?Temp:  [97.5 ?F (36.4 ?C)-98.5 ?F (36.9 ?C)] 98.5 ?F (36.9 ?C) (04/08 2993) ?Pulse Rate:  [67-80] 76 (04/08 0821) ?Resp:  [18-20] 20 (04/08 7169) ?BP: (107-143)/(65-75) 123/65 (04/08 6789) ?SpO2:  [98 %-100 %] 98 % (04/08 0821)     Height: 5' 6"  (167.6 cm) Weight: 117 kg BMI (Calculated): 41.66  ? ?Physical Exam:  ?Constitutional: alert, cooperative and no distress  ?Respiratory: breathing non-labored at rest  ?Cardiovascular: regular rate and sinus rhythm  ?Gastrointestinal: soft, non-tender, and non-distended ? ?Labs:  ? ?  Latest Ref Rng & Units 04/24/2021  ?  5:27 AM 04/23/2021  ?  5:53 AM 04/22/2021  ?  8:29 PM  ?CBC  ?WBC 4.0 - 10.5 K/uL 8.6   12.8   12.5    ?Hemoglobin 12.0 - 15.0 g/dL 12.0   13.4   16.0    ?Hematocrit 36.0 - 46.0 % 39.3   42.7   49.8    ?Platelets 150 - 400 K/uL 295   327   405    ? ? ?  Latest Ref Rng & Units 04/26/2021  ?  4:29 AM 04/24/2021  ?  5:27 AM 04/23/2021  ?  5:53 AM  ?CMP  ?Glucose 70 - 99 mg/dL 152   134   180    ?BUN 6 - 20 mg/dL 24   32   27    ?Creatinine 0.44 - 1.00 mg/dL 0.98   1.53   1.49    ?Sodium 135 - 145 mmol/L 140   143   140    ?Potassium 3.5 - 5.1 mmol/L 4.5   3.8   4.0    ?Chloride 98 - 111 mmol/L 107   105   99    ?CO2 22 - 32 mmol/L 25   29   30     ?Calcium 8.9 - 10.3 mg/dL 8.3   8.0   8.6    ? ? ?Imaging studies: I personally evaluated the images of the abdominal x-ray that  shows oral contrast in the large intestine ? ? ?Assessment/Plan:  ?56 y.o. female with small bowel obstruction, complicated by pertinent comorbidities including history of CVA, hypertension, diabetes, hyperlipidemia, diastolic CHF, COPD. ? ?-Gastrografin reaching large intestine.  There is a good sign that it should resolve without surgical intervention ?-Patient with slowly resolving SBO.  She is passing gas and had bowel movement yesterday.  She tolerated clear liquids and the pain is slowly resolving. ?-I will advance diet to full liquids and keep her on full liquids with slowly advance her to a more solid diet ?-I encouraged the patient to ambulate ?-Patient is mainly complaining of her chronic back pain that is not getting better with the gabapentin.  Will defer management of chronic back pain to hospitalist. ?-May use MiraLAX for constipation ?-We will continue to follow closely. ? ?Gwendolyn Grant, MD ? ? ? ?

## 2021-04-27 ENCOUNTER — Inpatient Hospital Stay: Payer: Medicare Other

## 2021-04-27 DIAGNOSIS — I5032 Chronic diastolic (congestive) heart failure: Secondary | ICD-10-CM | POA: Diagnosis not present

## 2021-04-27 DIAGNOSIS — N179 Acute kidney failure, unspecified: Secondary | ICD-10-CM | POA: Diagnosis not present

## 2021-04-27 DIAGNOSIS — K56609 Unspecified intestinal obstruction, unspecified as to partial versus complete obstruction: Secondary | ICD-10-CM | POA: Diagnosis not present

## 2021-04-27 LAB — GLUCOSE, CAPILLARY
Glucose-Capillary: 152 mg/dL — ABNORMAL HIGH (ref 70–99)
Glucose-Capillary: 144 mg/dL — ABNORMAL HIGH (ref 70–99)
Glucose-Capillary: 156 mg/dL — ABNORMAL HIGH (ref 70–99)
Glucose-Capillary: 179 mg/dL — ABNORMAL HIGH (ref 70–99)

## 2021-04-27 NOTE — Progress Notes (Signed)
Pt refused her hydralazine, coreg, and clonidine tonight due to her Bp dropping today. Morton Amy NP notified via secure chat. ?

## 2021-04-27 NOTE — Plan of Care (Signed)
?  Problem: Elimination: ?Goal: Will not experience complications related to bowel motility ?Outcome: Not Progressing ?  ?Problem: Pain Managment: ?Goal: General experience of comfort will improve ?Outcome: Not Progressing ?  ?Problem: Safety: ?Goal: Ability to remain free from injury will improve ?Outcome: Not Progressing ?  ?

## 2021-04-27 NOTE — Progress Notes (Signed)
[facility] Progress Note ? ?Patient: [name]    XVQ:[id number]  DOA: 04/22/2021    ?Date of Service: the patient was seen and examined on 04/27/2021 ? ?Brief hospital course: ?56 year old female with past medical history that includes hypertension, diabetes, diastolic heart failure, COPD and previous CVA on Plavix who presented to the emergency room on night of 4/4 with 1 day of abdominal pain along with nausea and vomiting and work-up revealed small bowel obstruction.  Patient had NG tube placed and admitted to the hospitalist service.  General surgery consulted who recommended initial conservative measures. ? ?Over the next few days, bowel obstruction slowly improving.  Patient able to have NG tube removed on evening of 4/7 and started on clear liquids which she is tolerating.  Diet advanced to full liquids at 4/8. ? ?Assessment and Plan: ?Assessment and Plan: ?SBO (small bowel obstruction) ([place]) ?Stable.  NG tube placed and general surgery following.  Conservative measures.  Resolving.  NG tube removed and started on clears.  Diet advanced to full's.  This morning, patient complaining of some mild abdominal pain as well as some dizziness.  Repeat x-ray notes resolution of small obstruction and although she has some increased gas, obstruction itself looks to be resolved.  Keep on fall today and advance to solids tomorrow if okay with surgery ? ?AKI (acute kidney injury) ([place]) ?- This is prerenal secondary to recurrent nausea and vomiting and volume depletion.  Resolved with IV fluids. ? ?Hypertensive urgency ?- We will continue antihypertensives and place her on as needed IV labetalol.  Stable.  ? ?Chronic diastolic CHF (congestive heart failure) ([place]) ?Echocardiogram in 2017 noted grade 1 diastolic dysfunction.  Patient came in dry and is getting IV fluids.  Monitor input and output.  Minimally elevated BNP on 4/8 of 276.  Given 1 dose of IV Lasix.  Pressures a little soft after Lasix.  Suspect  that is the cause of her dizziness.  Blood pressures within normal range by lunchtime today. ? ?Seizure disorder ([place]) ?- We will continue Keppra. ? ?Type 2 diabetes mellitus with diabetic neuropathy ([place]) ?That she is eating but on sensitive sliding scale. ?- We will continue her Neurontin for her diabetic neuropathy. ? ?Dyslipidemia ?- We will continue statin therapy. ? ?GERD without esophagitis ?- We will continue her PPI therapy. ? ?Class 3 severe obesity with serious comorbidity and body mass index (BMI) of 40.0 to 44.9 in adult [facility]) ?Patient meets criteria for morbid obesity with BMI greater than 40 ? ?OSA (obstructive sleep apnea) ?Using home CPAP ? ? ? ? ? ? ?Body mass index is 41.64 kg/m?.  ?  ?   ? ?Consultants: ?General surgery ? ?Procedures: ?NG tube placement, now removed ? ?Antimicrobials: ?None ? ?Code Status: Full code ? ? ?Subjective: Mild abdominal distention and discomfort, dizziness ?Objective: ?Vitals reviewed ?Vitals:  ? 04/27/21 0735 04/27/21 1253  ?BP: 103/68 136/66  ?Pulse: 70 67  ?Resp: 18   ?Temp: 98.6 ?F (37 ?C)   ?SpO2: 99%   ? ? ?Intake/Output Summary (Last 24 hours) at 04/27/2021 1351 ?Last data filed at 04/27/2021 1200 ?Gross per 24 hour  ?Intake 720 ml  ?Output 1200 ml  ?Net -480 ml  ? ? ?Filed Weights  ? 04/22/21 2019  ?Weight: 117 kg  ? ?Body mass index is 41.64 kg/m?. ? ?Exam: ? ?General: Alert and oriented x3, no acute distress ?HEENT: Normocephalic, atraumatic, NG tube in place, mucous membranes are slightly dry ?Cardiovascular: Regular rate and rhythm,  S1-S2 ?Respiratory: Clear to auscultation bilaterally ?Abdomen: Soft, minimal tenderness generalized, mildly distended, some bowel sounds ?Musculoskeletal: No clubbing or cyanosis, trace pitting edema ?Skin: No skin breaks, tears or lesions ?Psychiatry: Appropriate, no evidence of psychoses ?Neurology: No focal deficits ? ?Data Reviewed: ?No labs for today. ? ?Disposition:  ?Status is: Inpatient ?Remains inpatient appropriate  because: Continued small bowel obstruction ?  ? ?Anticipated discharge date: 4/10 ? ?Remaining issues to be resolved so that patient can be discharged: Tolerating solid food. ? ? ?Family Communication: Left message for daughter  ? ?DVT Prophylaxis: ? ?  Lovenox ? ? ?Author: ?Annita Brod ,MD ?04/27/2021 1:51 PM ? ?To reach On-call, see care teams to locate the attending and reach out via [email or web address]. ?Between 7PM-7AM, please contact night-coverage ?If you still have difficulty reaching the attending provider, please page the [facility] (Director on Call) for [facility] on amion for assistance. ? ?

## 2021-04-27 NOTE — Progress Notes (Addendum)
Patient ID: [name], female   DOB: [birth year], 56 y.o.   MRN: [id number] ?    SURGICAL PROGRESS NOTE  ? ?Hospital Day(s): 4.  ? ?Interval History: Patient seen and examined, no acute events or new complaints overnight. Patient reports having pain in her shoulders and back and legs.  Upon evaluation patient mainly complaining of chronic back and leg pains.  She denies any abdominal pain. ? ?She does says that she has not passed any gas in the last 24 hours.  She does not feel nausea or abdominal pain. ? ?Vital signs in last 24 hours: [min-max] current  ?Temp:  [97.5 ?F (36.4 ?C)-98.6 ?F (37 ?C)] 98.6 ?F (37 ?C) (04/09 0735) ?Pulse Rate:  [67-74] 70 (04/09 0735) ?Resp:  [18-20] 18 (04/09 0735) ?BP: (97-129)/(64-83) 103/68 (04/09 0735) ?SpO2:  [97 %-100 %] 99 % (04/09 0735)     Height: 5' 6"  (167.6 cm) Weight: 117 kg BMI (Calculated): 41.66  ? ?Physical Exam:  ?Constitutional: alert, cooperative and no distress  ?Respiratory: breathing non-labored at rest  ?Cardiovascular: regular rate and sinus rhythm  ?Gastrointestinal: soft, non-tender, and distended, mildly tympanic ? ?Labs:  ? ?  Latest Ref Rng & Units 04/24/2021  ?  5:27 AM 04/23/2021  ?  5:53 AM 04/22/2021  ?  8:29 PM  ?CBC  ?WBC 4.0 - 10.5 K/uL 8.6   12.8   12.5    ?Hemoglobin 12.0 - 15.0 g/dL 12.0   13.4   16.0    ?Hematocrit 36.0 - 46.0 % 39.3   42.7   49.8    ?Platelets 150 - 400 K/uL 295   327   405    ? ? ?  Latest Ref Rng & Units 04/26/2021  ?  4:29 AM 04/24/2021  ?  5:27 AM 04/23/2021  ?  5:53 AM  ?CMP  ?Glucose 70 - 99 mg/dL 152   134   180    ?BUN 6 - 20 mg/dL 24   32   27    ?Creatinine 0.44 - 1.00 mg/dL 0.98   1.53   1.49    ?Sodium 135 - 145 mmol/L 140   143   140    ?Potassium 3.5 - 5.1 mmol/L 4.5   3.8   4.0    ?Chloride 98 - 111 mmol/L 107   105   99    ?CO2 22 - 32 mmol/L 25   29   30     ?Calcium 8.9 - 10.3 mg/dL 8.3   8.0   8.6    ? ? ?Imaging studies: No new pertinent imaging studies ? ? ?Assessment/Plan:  ?56 y.o. female with small bowel  obstruction, complicated by pertinent comorbidities including history of CVA, hypertension, diabetes, hyperlipidemia, diastolic CHF, COPD. ?  ?-Gastrografin reaching large intestine.  There is a good sign that it should resolve without surgical intervention ?-Today she looks more distended and passing gas.  She is not nauseated and does not have any abdominal pain.  I will order abdominal x-ray for evaluation of small bowel dilation.  If they are increasing small bowel dilation I will put patient n.p.o. for bowel rest. ?-I encouraged the patient to ambulate ?-Patient is mainly complaining of her chronic back pain that is not getting better with the gabapentin.  Will defer management of chronic back pain to hospitalist. ?-May use MiraLAX for constipation ?-We will continue to follow closely. ? ?Gwendolyn Grant, MD ? ?Addendum 4-9/23 at 11:45 am: ? ?Abdominal x-ray reviewed.  There  is no sign of recurrent SBO.  We will continue full liquids and not advance due to patient not passing gas at the moment.  Patient with multiple pain complaints very difficult to associate the pain to her SBO.  Continue MiraLAX. ? ?Herbert Pun, MD, FACS ? ? ? ?

## 2021-04-28 DIAGNOSIS — K5900 Constipation, unspecified: Secondary | ICD-10-CM

## 2021-04-28 DIAGNOSIS — K56609 Unspecified intestinal obstruction, unspecified as to partial versus complete obstruction: Secondary | ICD-10-CM | POA: Diagnosis not present

## 2021-04-28 DIAGNOSIS — I5032 Chronic diastolic (congestive) heart failure: Secondary | ICD-10-CM | POA: Diagnosis not present

## 2021-04-28 LAB — GLUCOSE, CAPILLARY
Glucose-Capillary: 135 mg/dL — ABNORMAL HIGH (ref 70–99)
Glucose-Capillary: 145 mg/dL — ABNORMAL HIGH (ref 70–99)
Glucose-Capillary: 191 mg/dL — ABNORMAL HIGH (ref 70–99)
Glucose-Capillary: 183 mg/dL — ABNORMAL HIGH (ref 70–99)

## 2021-04-28 MED ORDER — BISACODYL 5 MG PO TBEC
5.0000 mg | DELAYED_RELEASE_TABLET | Freq: Once | ORAL | Status: AC
  Administered 2021-04-28: 5 mg via ORAL
  Filled 2021-04-28: qty 1

## 2021-04-28 MED ORDER — POLYETHYLENE GLYCOL 3350 17 G PO PACK
17.0000 g | PACK | Freq: Every day | ORAL | Status: DC
  Administered 2021-04-29 – 2021-05-01 (×3): 17 g via ORAL
  Filled 2021-04-28 (×3): qty 1

## 2021-04-28 NOTE — Progress Notes (Signed)
[facility] ?SURGICAL PROGRESS NOTE (cpt [phone]) ? ?Hospital Day(s): 5.  ? ?Interval History: Patient seen and examined, no acute events or new complaints overnight. Patient reports she is doing well. Still with some mild abdominal soreness but only needed morphine once overnight. Now resolved. She denies fever, chills, nausea, emesis. No new labs this morning. Her KUB yesterday continued to show gas throughout the colon and rectum. Diet has been advanced to full liquids; she denied any flatus but has had small loose BM over weekend.   ? ?Review of Systems:  ?Constitutional: denies fever, chills  ?HEENT: denies cough or congestion  ?Respiratory: denies any shortness of breath  ?Cardiovascular: denies chest pain or palpitations  ?Gastrointestinal: denies abdominal pain, N/V, or diarrhea ?Genitourinary: denies burning with urination or urinary frequency ?Musculoskeletal: denies pain, decreased motor or sensation ? ?Vital signs in last 24 hours: [min-max] current  ?Temp:  [98.3 ?F (36.8 ?C)-98.6 ?F (37 ?C)] 98.3 ?F (36.8 ?C) (04/10 0422) ?Pulse Rate:  [65-81] 81 (04/10 0422) ?Resp:  [18] 18 (04/10 0422) ?BP: (103-136)/(61-94) 120/94 (04/10 0422) ?SpO2:  [96 %-99 %] 96 % (04/10 0422)     Height: 5' 6"  (167.6 cm) Weight: 117 kg BMI (Calculated): 41.66  ? ?Intake/Output last 2 shifts:  ?04/09 0701 - 04/10 0700 ?In: 1140 [P.O.:1140] ?Out: 800 [Urine:800]  ? ?Physical Exam:  ?Constitutional: alert, cooperative and no distress  ?HENT: normocephalic without obvious abnormality  ?Eyes: PERRL, EOM's grossly intact and symmetric  ?Respiratory: breathing non-labored at rest  ?Cardiovascular: regular rate and sinus rhythm  ?Gastrointestinal: Soft, non-tender, some distension and tympany, no rebound/guarding ?Musculoskeletal:  no edema or wounds, motor and sensation grossly intact, NT  ? ? ?Labs:  ? ?  Latest Ref Rng & Units 04/24/2021  ?  5:27 AM 04/23/2021  ?  5:53 AM 04/22/2021  ?  8:29 PM  ?CBC  ?WBC 4.0 - 10.5 K/uL  8.6   12.8   12.5    ?Hemoglobin 12.0 - 15.0 g/dL 12.0   13.4   16.0    ?Hematocrit 36.0 - 46.0 % 39.3   42.7   49.8    ?Platelets 150 - 400 K/uL 295   327   405    ? ? ?  Latest Ref Rng & Units 04/26/2021  ?  4:29 AM 04/24/2021  ?  5:27 AM 04/23/2021  ?  5:53 AM  ?CMP  ?Glucose 70 - 99 mg/dL 152   134   180    ?BUN 6 - 20 mg/dL 24   32   27    ?Creatinine 0.44 - 1.00 mg/dL 0.98   1.53   1.49    ?Sodium 135 - 145 mmol/L 140   143   140    ?Potassium 3.5 - 5.1 mmol/L 4.5   3.8   4.0    ?Chloride 98 - 111 mmol/L 107   105   99    ?CO2 22 - 32 mmol/L 25   29   30     ?Calcium 8.9 - 10.3 mg/dL 8.3   8.0   8.6    ? ? ? ?Imaging studies: No new pertinent imaging studies ? ? ?Assessment/Plan: (ICD-10's: K56.609) ?56 y.o. female with clinically and radiographically improved small bowel obstruction secondary to adhesive disease in setting of multiple abdominal surgeries. ? ? - I think we can trial diet advancement today; placed on Heart Healthy/Carb Modified diet  ? - Wean from IVF  ? - No indication for surgical interventions  ?-  Monitor abdominal examination; on-going bowel function ?- Pain control prn; antiemetics prn  ?          - Mobilization as tolerated ?          - Further management per primary service; we will follow  ? ?All of the above findings and recommendations were discussed with the patient, and the medical team, and all of patient's questions were answered to her expressed satisfaction. ? ?-- ?Edison Simon, [name] ?[facility] ?04/28/2021, 7:32 AM ?[phone] ?M-F: 7am - 4pm ? ?

## 2021-04-28 NOTE — Assessment & Plan Note (Addendum)
Noted on abdominal CT on admission.   ?Now that small bowel obstruction has resolved, Patient still with some significant constipation.  ?Continue Senokot, bowel regimen. ?

## 2021-04-28 NOTE — Progress Notes (Signed)
[facility] Progress Note ? ?Patient: [name]    KZS:[id number]  DOA: 04/22/2021    ?Date of Service: the patient was seen and examined on 04/28/2021 ? ?Brief hospital course: ?56 year old female with past medical history that includes hypertension, diabetes, diastolic heart failure, COPD and previous CVA on Plavix who presented to the emergency room on night of 4/4 with 1 day of abdominal pain along with nausea and vomiting and work-up revealed small bowel obstruction.  Patient had NG tube placed and admitted to the hospitalist service.  General surgery consulted who recommended initial conservative measures. ? ?Over the next few days, bowel obstruction slowly improving.  Patient able to have NG tube removed on evening of 4/7 and started on clear liquids which she is tolerating.  Diet advanced to full liquids at 4/8.  Patient tolerating full liquids and diet advanced to solid food as of this morning.  Patient still complains some abdominal discomfort and nausea although not enough that she will is taking Zofran. ? ?Assessment and Plan: ?Assessment and Plan: ?SBO (small bowel obstruction) ([place]) ?Resolved.  Following NG tube placement, after several days, noted resolution tube removed and started on clear liquids which were advanced to full's and currently on soft heart healthy.  Suspect her abdominal discomfort and nausea may be more from constipation.  We will plan to give her something to help move her bowels.  ? ?Constipation ?Noted on abdominal CT on admission.  Now that small bowel obstruction resolved, we will give her medication to help clean her out ? ?AKI (acute kidney injury) ([place]) ?- This is prerenal secondary to recurrent nausea and vomiting and volume depletion.  Resolved with IV fluids. ? ?Hypertensive urgency ?- We will continue antihypertensives and place her on as needed IV labetalol.  Stable.  ? ?Chronic diastolic CHF (congestive heart failure) ([place]) ?Echocardiogram in 2017 noted  grade 1 diastolic dysfunction.  Patient came in dry and is getting IV fluids.  Monitor input and output.  Minimally elevated BNP on 4/8 of 276.  Given 1 dose of IV Lasix.  Pressures a little soft after Lasix.  Suspect that is the cause of her dizziness.  Blood pressures within normal range by lunchtime today. ? ?Seizure disorder ([place]) ?- We will continue Keppra. ? ?Type 2 diabetes mellitus with diabetic neuropathy ([name]) ?That she is eating but on sensitive sliding scale. ?- We will continue her Neurontin for her diabetic neuropathy. ? ?Dyslipidemia ?- We will continue statin therapy. ? ?GERD without esophagitis ?- We will continue her PPI therapy. ? ?Class 3 severe obesity with serious comorbidity and body mass index (BMI) of 40.0 to 44.9 in adult [facility]) ?Patient meets criteria for morbid obesity with BMI greater than 40 ? ?OSA (obstructive sleep apnea) ?Using home CPAP ? ? ? ? ? ? ?Body mass index is 41.64 kg/m?.  ?  ?   ? ?Consultants: ?General surgery ? ?Procedures: ?NG tube placement, now removed ? ?Antimicrobials: ?None ? ?Code Status: Full code ? ? ?Subjective: Mild abdominal distention and discomfort, dizziness ?Objective: ?Vitals reviewed ?Vitals:  ? 04/28/21 0422 04/28/21 0748  ?BP: (!) 120/94 (!) 155/91  ?Pulse: 81 66  ?Resp: 18 16  ?Temp: 98.3 ?F (36.8 ?C) 98 ?F (36.7 ?C)  ?SpO2: 96% 100%  ? ? ?Intake/Output Summary (Last 24 hours) at 04/28/2021 1503 ?Last data filed at 04/28/2021 1413 ?Gross per 24 hour  ?Intake 1380 ml  ?Output --  ?Net 1380 ml  ? ? ?Filed Weights  ? 04/22/21 2019  ?  Weight: 117 kg  ? ?Body mass index is 41.64 kg/m?. ? ?Exam: ? ?General: Alert and oriented x3, no acute distress ?HEENT: Normocephalic [place], mucous membranes are moist ?Cardiovascular: Regular rate and rhythm, S1-S2 ?Respiratory: Clear to auscultation bilaterally ?Abdomen: Soft, nontender, nondistended, positive bowel sounds ?Musculoskeletal: No clubbing or cyanosis, trace pitting edema ?Skin: No skin breaks, tears or  lesions ?Psychiatry: Appropriate, no evidence of psychoses ?Neurology: No focal deficits ? ?Data Reviewed: ?No labs for today. ? ?Disposition:  ?Status is: Inpatient ?Remains inpatient appropriate because: Tolerating of p.o. ?  ? ?Anticipated discharge date: 4/11 ? ?Remaining issues to be resolved so that patient can be discharged: Treatment of constipation ? ? ?Family Communication: Left message for daughter  ? ?DVT Prophylaxis: ? ?  Lovenox ? ? ?Author: ?Annita Brod ,MD ?04/28/2021 3:03 PM ? ?To reach On-call, see care teams to locate the attending and reach out via [email or web address]. ?Between 7PM-7AM, please contact night-coverage ?If you still have difficulty reaching the attending provider, please page the [facility] (Director on Call) for [facility] on amion for assistance. ? ?

## 2021-04-28 NOTE — Care Management Important Message (Signed)
Important Message ? ?Patient Details  ?Name: [name] ?MRN: [id number] ?Date of Birth: 02-03-1965 ? ? ?Medicare Important Message Given:  Yes ? ? ? ? ?Dannette Barbara ?04/28/2021, 12:06 PM ?

## 2021-04-29 DIAGNOSIS — I951 Orthostatic hypotension: Secondary | ICD-10-CM | POA: Diagnosis not present

## 2021-04-29 DIAGNOSIS — I5032 Chronic diastolic (congestive) heart failure: Secondary | ICD-10-CM | POA: Diagnosis not present

## 2021-04-29 DIAGNOSIS — N179 Acute kidney failure, unspecified: Secondary | ICD-10-CM | POA: Diagnosis not present

## 2021-04-29 DIAGNOSIS — K56609 Unspecified intestinal obstruction, unspecified as to partial versus complete obstruction: Secondary | ICD-10-CM | POA: Diagnosis not present

## 2021-04-29 LAB — GLUCOSE, CAPILLARY
Glucose-Capillary: 199 mg/dL — ABNORMAL HIGH (ref 70–99)
Glucose-Capillary: 212 mg/dL — ABNORMAL HIGH (ref 70–99)
Glucose-Capillary: 186 mg/dL — ABNORMAL HIGH (ref 70–99)
Glucose-Capillary: 227 mg/dL — ABNORMAL HIGH (ref 70–99)

## 2021-04-29 MED ORDER — SODIUM CHLORIDE 0.9 % IV SOLN: INTRAVENOUS | Status: DC

## 2021-04-29 NOTE — Assessment & Plan Note (Addendum)
Nursing attempted to get patient up on afternoon of 4/11 and she complained of dizziness.  Systolic blood pressure 148, but that was laying down.  Patient has not really been up since admission.  She is likely having orthostatic hypotension brought on by prolonged bed rest. Started her on IV fluids  and have them checking orthostatics on her every shift.  Encouraged to increase mobilization. ?

## 2021-04-29 NOTE — Progress Notes (Signed)
[facility] ?SURGICAL PROGRESS NOTE (cpt [id number]) ? ?Hospital Day(s): 6.  ? ?Interval History: Patient seen and examined, no acute events or new complaints overnight. Patient report she is doing okay. No reports of abdominal pain. She does have some early fullness with eating but was able to tolerated omelette and grit without issue this morning. No fever, chills, nausea, emesis. She still denied any flatus or BM, but difficult to determine how reliable she is. She has received daily Miralax and Milk of Magnesia this morning.  ? ?Review of Systems:  ?Constitutional: denies fever, chills  ?HEENT: denies cough or congestion  ?Respiratory: denies any shortness of breath  ?Cardiovascular: denies chest pain or palpitations  ?Gastrointestinal: denies abdominal pain, N/V, or diarrhea ?Genitourinary: denies burning with urination or urinary frequency ?Musculoskeletal: denies pain, decreased motor or sensation ? ?Vital signs in last 24 hours: [min-max] current  ?Temp:  [97.8 ?F (36.6 ?C)-98.4 ?F (36.9 ?C)] 97.8 ?F (36.6 ?C) (04/11 4008) ?Pulse Rate:  [72-80] 80 (04/11 0752) ?Resp:  [16-18] 18 (04/11 0752) ?BP: (101-134)/(60-71) 125/67 (04/11 0752) ?SpO2:  [97 %-99 %] 99 % (04/11 0752)     Height: 5' 6"  (167.6 cm) Weight: 117 kg BMI (Calculated): 41.66  ? ?Intake/Output last 2 shifts:  ?04/10 0701 - 04/11 0700 ?In: 840 [P.O.:840] ?Out: -   ? ?Physical Exam:  ?Constitutional: alert, cooperative and no distress  ?HENT: normocephalic without obvious abnormality  ?Eyes: PERRL, EOM's grossly intact and symmetric  ?Respiratory: breathing non-labored at rest  ?Cardiovascular: regular rate and sinus rhythm  ?Gastrointestinal: Soft, non-tender, some distension and tympany, no rebound/guarding ?Musculoskeletal:  no edema or wounds, motor and sensation grossly intact, NT  ? ? ?Labs:  ? ?  Latest Ref Rng & Units 04/24/2021  ?  5:27 AM 04/23/2021  ?  5:53 AM 04/22/2021  ?  8:29 PM  ?CBC  ?WBC 4.0 - 10.5 K/uL 8.6   12.8   12.5     ?Hemoglobin 12.0 - 15.0 g/dL 12.0   13.4   16.0    ?Hematocrit 36.0 - 46.0 % 39.3   42.7   49.8    ?Platelets 150 - 400 K/uL 295   327   405    ? ? ?  Latest Ref Rng & Units 04/26/2021  ?  4:29 AM 04/24/2021  ?  5:27 AM 04/23/2021  ?  5:53 AM  ?CMP  ?Glucose 70 - 99 mg/dL 152   134   180    ?BUN 6 - 20 mg/dL 24   32   27    ?Creatinine 0.44 - 1.00 mg/dL 0.98   1.53   1.49    ?Sodium 135 - 145 mmol/L 140   143   140    ?Potassium 3.5 - 5.1 mmol/L 4.5   3.8   4.0    ?Chloride 98 - 111 mmol/L 107   105   99    ?CO2 22 - 32 mmol/L 25   29   30     ?Calcium 8.9 - 10.3 mg/dL 8.3   8.0   8.6    ? ? ? ?Imaging studies: No new pertinent imaging studies ? ? ?Assessment/Plan: (ICD-10's: K56.609) ?56 y.o. female with clinically and radiographically improved small bowel obstruction secondary to adhesive disease in setting of multiple abdominal surgeries. ? ? - Continue diet as tolerated  ? - Continue bowel aggressive regimen ? - No indication for surgical interventions  ?- Monitor abdominal examination; on-going bowel function ?- Pain control  prn; antiemetics prn  ?         - Mobilization as tolerated ?         - Further management per primary service ? ? - Not much more to add from surgical perspective; We will follow peripherally for now ? ?All of the above findings and recommendations were discussed with the patient, and the medical team, and all of patient's questions were answered to her expressed satisfaction. ? ?-- ?Edison Simon, [name] ? [facility] ?04/29/2021, 8:34 AM ?[phone] ?M-F: 7am - 4pm ? ?

## 2021-04-29 NOTE — Progress Notes (Addendum)
[facility] Progress Note ? ?Patient: [name]    DJM:[id number]  DOA: 04/22/2021    ?Date of Service: the patient was seen and examined on 04/29/2021 ? ?Brief hospital course: ?56 year old female with past medical history that includes hypertension, diabetes, diastolic heart failure, COPD and previous CVA on Plavix who presented to the emergency room on night of 4/4 with 1 day of abdominal pain along with nausea and vomiting and work-up revealed small bowel obstruction.  Patient had NG tube placed and admitted to the hospitalist service.  General surgery consulted who recommended initial conservative measures. ? ?Over the next few days, bowel obstruction slowly improving.  Patient able to have NG tube removed on evening of 4/7 and started on clear liquids which she is tolerating.  Diet advanced to full liquids at 4/8.  Patient tolerating full liquids and diet advanced to solid food as of this morning.  Patient still complains some abdominal discomfort and nausea although not enough that she will is taking Zofran.  She is still quite constipated. ? ?Addendum: On afternoon of 4/11, nursing attempted to get patient up and she complained of feeling quite dizzy.  Systolic around 222 laying down.   ? ?Assessment and Plan: ?Assessment and Plan: ?Orthostatic hypotension ?Nursing attempted to get patient up on afternoon of 4/11 and she complained of dizziness.  Systolic blood pressure 979, but that was laying down.  Patient has not really been up since admission.  She is likely having orthostatic hypotension brought on by prolonged bed rest.  I have started IV fluids on her and have them checking orthostatics on her every shift.  Need to increase mobilization likely prolonging her discharge by a day or 2. ? ?Constipation ?Noted on abdominal CT on admission.  Now that small bowel obstruction resolved, patient still with some significant constipation.  Attempting to treat. ? ?SBO (small bowel obstruction)  ([place]) ?Resolved.  Following NG tube placement, after several days, noted resolution tube removed and started on clear liquids which were advanced to full's and currently on soft heart healthy.  Suspect her abdominal discomfort may be more from constipation.  Bowels have not yet moved.  Nausea much improved. ? ?AKI (acute kidney injury) ([place]) ?- This is prerenal secondary to recurrent nausea and vomiting and volume depletion.  Resolved with IV fluids. ? ?Hypertensive urgency ?- We will continue antihypertensives and place her on as needed IV labetalol.  Stable.  ? ?Chronic diastolic CHF (congestive heart failure) ([place]) ?Echocardiogram in 2017 noted grade 1 diastolic dysfunction.  Patient came in dry and is getting IV fluids.  Monitor input and output.  Minimally elevated BNP on 4/8 of 276.  Given 1 dose of IV Lasix.  Pressures a little soft after Lasix.  Suspect that is the cause of her dizziness.  Blood pressures within normal range by lunchtime today. ? ?Seizure disorder ([place]) ?- We will continue Keppra. ? ?Type 2 diabetes mellitus with diabetic neuropathy ([place]) ?That she is eating but on sensitive sliding scale. ?- We will continue her Neurontin for her diabetic neuropathy. ? ?Dyslipidemia ?- We will continue statin therapy. ? ?GERD without esophagitis ?- We will continue her PPI therapy. ? ?Class 3 severe obesity with serious comorbidity and body mass index (BMI) of 40.0 to 44.9 in adult [facility]) ?Patient meets criteria for morbid obesity with BMI greater than 40 ? ?OSA (obstructive sleep apnea) ?Using home CPAP ? ? ? ? ? ? ?Body mass index is 41.64 kg/m?.  ?  ?   ? ?  Consultants: ?General surgery ? ?Procedures: ?NG tube placement, now removed ? ?Antimicrobials: ?None ? ?Code Status: Full code ? ? ?Subjective: Mild abdominal distention. ?Objective: ?Vitals reviewed ?Vitals:  ? 04/29/21 1630 04/29/21 1633  ?BP: (!) 142/115 120/84  ?Pulse: 78 86  ?Resp: 20 20  ?Temp:    ?SpO2: 100% 91%  ? ? ?Intake/Output Summary (Last  24 hours) at 04/29/2021 1802 ?Last data filed at 04/29/2021 0900 ?Gross per 24 hour  ?Intake 600 ml  ?Output --  ?Net 600 ml  ? ?Filed Weights  ? 04/22/21 2019  ?Weight: 117 kg  ? ?Body mass index is 41.64 kg/m?. ? ?Exam: ? ?General: Alert and oriented x3, no acute distress ?HEENT: Normocephalic [place], mucous membranes are moist ?Cardiovascular: Regular rate and rhythm, S1-S2 ?Respiratory: Clear to auscultation bilaterally ?Abdomen: Soft, nontender, minimally distended, positive bowel sounds ?Musculoskeletal: No clubbing or cyanosis, trace pitting edema ?Skin: No skin breaks, tears or lesions ?Psychiatry: Appropriate, no evidence of psychoses ?Neurology: No focal deficits ? ?Data Reviewed: ?No labs for today. ? ?Disposition:  ?Status is: Inpatient ?Remains inpatient appropriate because: Tolerating of p.o. ?  ? ?Anticipated discharge date: 4/12 ? ?Remaining issues to be resolved so that patient can be discharged: Treatment of constipation ? ? ?Family Communication: Left message for daughter  ? ?DVT Prophylaxis: ? ?  Lovenox ? ? ?Author: ?Annita Brod ,MD ?04/29/2021 6:02 PM ? ?To reach On-call, see care teams to locate the attending and reach out via [email or web address]. ?Between 7PM-7AM, please contact night-coverage ?If you still have difficulty reaching the attending provider, please page the [facility] (Director on Call) for [facility] on amion for assistance. ? ?

## 2021-04-29 NOTE — Progress Notes (Signed)
Mobility Specialist - Progress Note ? ? 04/29/21 1500  ?Mobility  ?Activity Refused mobility  ? ? ? ?Pt lying in bed upon arrival, reporting lightheadedness and dizziness while supine. Pt politely declines OOB mobility at this time, states she's been limited to [facility] transfers only this date d/t increased dizziness when upright. BP checked 102/86. RN notified. Will attempt session another date.  ? ? ?Kathee Delton ?Mobility Specialist ?04/29/21, 3:07 PM ? ?

## 2021-04-30 DIAGNOSIS — K56609 Unspecified intestinal obstruction, unspecified as to partial versus complete obstruction: Secondary | ICD-10-CM | POA: Diagnosis not present

## 2021-04-30 LAB — GLUCOSE, CAPILLARY
Glucose-Capillary: 202 mg/dL — ABNORMAL HIGH (ref 70–99)
Glucose-Capillary: 287 mg/dL — ABNORMAL HIGH (ref 70–99)
Glucose-Capillary: 190 mg/dL — ABNORMAL HIGH (ref 70–99)
Glucose-Capillary: 197 mg/dL — ABNORMAL HIGH (ref 70–99)

## 2021-04-30 NOTE — Progress Notes (Signed)
Mobility Specialist - Progress Note ? ? ? 04/30/21 1400  ?Mobility  ?Activity Stood at bedside;Dangled on edge of bed;Ambulated with assistance in hallway  ?Level of Assistance Standby assist, set-up cues, supervision of patient - no hands on  ?Assistive Device Front wheel walker  ?Distance Ambulated (ft) 160 ft  ?Activity Response Tolerated well  ?$Mobility charge 1 Mobility  ? ? ?During mobility: 76 HR, 98% SpO2 ? ?Pt supine upon arrival using RA.  Pt completes bed mobility to sit EOB indep and STS with ModI. Ambulates 163f with SBA --- 1 pt initiated break and voices mild dizziness. Tolerates well and returns to bed with needs in reach. ? ?MMerrily Brittle?Mobility Specialist ?04/30/21, 2:24 PM ? ? ? ? ?

## 2021-04-30 NOTE — Progress Notes (Signed)
?Progress Note ? ? ?Patient: [name] QQV:[id number] DOB: 1965/03/08 DOA: 04/22/2021     7 ? ?DOS: the patient was seen and examined on 04/30/2021 ?  ?Brief hospital course: ?This 56 year old female with past medical history significant for hypertension, diabetes, diastolic heart failure, COPD and previous CVA on Plavix who presented to the emergency room on night of 4/4 with 1 day  of abdominal pain along with nausea and vomiting and work-up revealed small bowel obstruction.  Patient had NG tube placed and admitted to the hospitalist service.  General surgery consulted who recommended initial conservative measures. Over the next few days, bowel obstruction slowly improving.  Patient able to have NG tube removed on evening of 4/7 and started on clear liquids which she is tolerating.  Diet advanced to full liquids at 4/8.  Patient tolerating full liquids and diet advanced to solid food as of this morning.  Patient still complains some abdominal discomfort and nausea although not enough that she will is taking Zofran.  She is still quite constipated. ? ?Addendum: On afternoon of 4/11, nursing attempted to get patient up and she complained of feeling quite dizzy.  Systolic around 332 laying down.   ? ? ?Assessment and Plan: ?Orthostatic hypotension ?Nursing attempted to get patient up on afternoon of 4/11 and she complained of dizziness.  Systolic blood pressure 951, but that was laying down.  Patient has not really been up since admission.  She is likely having orthostatic hypotension brought on by prolonged bed rest. Started her on IV fluids  and have them checking orthostatics on her every shift.  Encouraged to increase mobilization. ? ?Constipation ?Noted on abdominal CT on admission.   ?Now that small bowel obstruction has resolved, Patient still with some significant constipation.  ?Continue Senokot, bowel regimen. ? ?SBO (small bowel obstruction) ([place]) ?Resolved. Suspect her abdominal discomfort may be  more from constipation.   ?Bowels have not yet moved.  Nausea much improved. ? ?AKI (acute kidney injury) ([place]) ?Likely prerenal secondary to nausea and vomiting and volume depletion. ?AKI resolved with IV hydration. ? ? ?Hypertensive urgency ?Continue antihypertensives. ? ?Chronic diastolic CHF (congestive heart failure) ([place]) ?Echo: in 2017 noted grade 1 diastolic dysfunction.  Patient appears euvolemic.  Continued IV hydration.  ? Monitor input and output.  Minimally elevated BNP on 4/8 of 276.  Given 1 dose of IV Lasix.  Pressures a little soft after Lasix.  Suspect that is the cause of her dizziness.  Blood pressure is now normal. ? ?Seizure disorder ([place]) ?Continue Keppra. ? ?Type 2 diabetes mellitus with diabetic neuropathy ([place]) ?Continue sliding scale. ?Continue Neurontin for her diabetic neuropathy. ? ?Dyslipidemia ?Continue statin therapy. ? ?GERD without esophagitis ?Continue PPI therapy. ? ?Class 3 severe obesity with serious comorbidity and body mass index (BMI) of 40.0 to 44.9 in adult [facility]) ?Patient meets criteria for morbid obesity with BMI greater than 40. ? ?OSA (obstructive sleep apnea) ?Continue home CPAP ? ? ?Subjective: Patient was seen and examined at bedside.  Overnight events noted.   ?Patient reports feeling much improved,  she had little bowel movement but still feels bloated. ? ?Physical Exam: ?Vitals:  ? 04/30/21 0408 04/30/21 0410 04/30/21 0414 04/30/21 8841  ?BP: 107/69 134/80 (!) 147/84 123/66  ?Pulse: 79 79 79 76  ?Resp: 20   18  ?Temp: 98.5 ?F (36.9 ?C)   97.9 ?F (36.6 ?C)  ?TempSrc:    Oral  ?SpO2: 97%   97%  ?Weight:      ?  Height:      ? ?General exam: Appears comfortable, not in any acute distress.  Deconditioned ?Respiratory system: CTA bilaterally, no wheezing, no crackles, normal respiratory effort. ?Cardiovascular system: S1-S2 heard, regular rate and rhythm, no murmur. ?Gastrointestinal system: Abdomen is soft, mildly tender, non distended, BS+ ?Central nervous system:  Alert, oriented x 3, no focal neurological deficits. ?Extremities: No edema, no cyanosis, no clubbing. ?Psychiatry: Mood, insight, judgment normal ? ? ? ?Data Reviewed: ?I have Reviewed nursing notes, Vitals, and Lab results since pt's last encounter. Pertinent lab results CBC, CMP, x-ray abdomen ?I have ordered test including CBC, CMP ?I have reviewed the last note from general surgery,  ?I have discussed pt's care plan and test results with patient.  ? ?Family Communication: No family at bedside ? ?Disposition: ?Status is: Inpatient ?Remains inpatient appropriate because: Admitted for orthostatic hypotension found to have SBO which is now resolved patient continued to have constipation now requiring laxatives. ?Anticipate discharge home tomorrow 05/01/2021 ? Planned Discharge Destination: Home. ? ? ? ? ?Time spent: 50 minutes ? ?Author: ?Shawna Clamp, MD ?04/30/2021 1:19 PM ? ?For on call review [email or web address].  ?

## 2021-05-01 LAB — CBC
HCT: 37.8 % (ref 36.0–46.0)
Hemoglobin: 11.8 g/dL — ABNORMAL LOW (ref 12.0–15.0)
MCH: 28.6 pg (ref 26.0–34.0)
MCHC: 31.2 g/dL (ref 30.0–36.0)
MCV: 91.7 fL (ref 80.0–100.0)
Platelets: 273 10*3/uL (ref 150–400)
RBC: 4.12 MIL/uL (ref 3.87–5.11)
RDW: 14.2 % (ref 11.5–15.5)
WBC: 7.2 10*3/uL (ref 4.0–10.5)
nRBC: 0 % (ref 0.0–0.2)

## 2021-05-01 LAB — BASIC METABOLIC PANEL
Anion gap: 6 (ref 5–15)
BUN: 27 mg/dL — ABNORMAL HIGH (ref 6–20)
CO2: 23 mmol/L (ref 22–32)
Calcium: 8.6 mg/dL — ABNORMAL LOW (ref 8.9–10.3)
Chloride: 105 mmol/L (ref 98–111)
Creatinine, Ser: 1.22 mg/dL — ABNORMAL HIGH (ref 0.44–1.00)
GFR, Estimated: 52 mL/min — ABNORMAL LOW (ref >60)
Glucose, Bld: 189 mg/dL — ABNORMAL HIGH (ref 70–99)
Potassium: 4.4 mmol/L (ref 3.5–5.1)
Sodium: 134 mmol/L — ABNORMAL LOW (ref 135–145)

## 2021-05-01 LAB — GLUCOSE, CAPILLARY: Glucose-Capillary: 184 mg/dL — ABNORMAL HIGH (ref 70–99)

## 2021-05-01 LAB — MAGNESIUM: Magnesium: 2.2 mg/dL (ref 1.7–2.4)

## 2021-05-01 LAB — PHOSPHORUS: Phosphorus: 3.4 mg/dL (ref 2.5–4.6)

## 2021-05-01 NOTE — Progress Notes (Signed)
Patient discharged to home with all belongings. No changes or additions to home medications. Medications reviewed with patients No complaints or concerns voiced at this time. PIVX1 removed with catheter intact.  ?

## 2021-05-01 NOTE — Plan of Care (Signed)
  Problem: Education: Goal: Knowledge of General Education information will improve Description Including pain rating scale, medication(s)/side effects and non-pharmacologic comfort measures Outcome: Progressing   

## 2021-05-01 NOTE — Discharge Instructions (Signed)
Advised to follow-up with primary care physician in 1 week. ?Small bowel obstruction has resolved.  Patient had 2 big bowel movements. ?Advised to continue current medications as prescribed. ?

## 2021-05-01 NOTE — Plan of Care (Signed)
?  Problem: Education: ?Goal: Knowledge of General Education information will improve ?Description: Including pain rating scale, medication(s)/side effects and non-pharmacologic comfort measures ?05/01/2021 0916 by Evelena Peat, RN ?Outcome: Completed/Met ?05/01/2021 0915 by Evelena Peat, RN ?Outcome: Progressing ?  ?

## 2021-05-01 NOTE — TOC Transition Note (Signed)
Transition of Care (TOC) - CM/SW Discharge Note ? ? ?Patient Details  ?Name: [name] ?MRN: [id number] ?Date of Birth: 1965-01-24 ? ?Transition of Care (TOC) CM/SW Contact:  ?Candie Chroman, LCSW ?Phone Number: ?05/01/2021, 8:36 AM ? ? ?Clinical Narrative: Patient has orders to discharge home today. Left message for [place] representative to notify. No further concerns. CSW signing off.   ? ?Final next level of care: [place] ?Barriers to Discharge: Barriers Resolved ? ? ?Patient Goals and CMS Choice ?Patient states their goals for this hospitalization and ongoing recovery are:: To get stronger ?  ?Choice offered to / list presented to : NA ? ?Discharge Placement ?  ?           ?  ?  ?  ?Patient and family notified of of transfer: 05/01/21 ? ?Discharge Plan and Services ?  ?Discharge Planning Services: CM Consult ?Post Acute Care Choice: Home Health          ?  ?  ?  ?  ?  ?HH Arranged: PT, OT, Social Work ?[place] Agency: [place] ([place]) ?Date HH Agency Contacted: 05/01/21 ?  ?Representative spoke with at [place]: Floydene Flock ? ?Social Determinants of Health (SDOH) Interventions ?  ? ? ?Readmission Risk Interventions ? ?  04/24/2021  ?  3:02 PM  ?Readmission Risk Prevention Plan  ?Transportation Screening Complete  ?[place] or Home Care Consult Complete  ?Palliative Care Screening Not Applicable  ?Medication Review [employer]) Complete  ? ? ? ? ? ?

## 2021-05-01 NOTE — Discharge Summary (Signed)
?Physician Discharge Summary ?  ?Patient: [name] MRN: [id number] DOB: Mar 26, 1965  ?Admit date:     04/22/2021  ?Discharge date: 05/01/21  ?Discharge Physician: Shawna Clamp  ? ?PCP: Delsa Grana, [name]  ? ?Recommendations at discharge:  ?Advised to follow-up with primary care physician in 1 week. ?Small bowel obstruction has resolved.  Patient had 2 big bowel movements. ?Advised to continue current medications as prescribed. ? ?Discharge Diagnoses: ?Active Problems: ?  Orthostatic hypotension ?  SBO (small bowel obstruction) ([place]) ?  Constipation ?  AKI (acute kidney injury) ([place]) ?  Hypertensive urgency ?  Chronic diastolic CHF (congestive heart failure) ([place]) ?  Seizure disorder ([place]) ?  Type 2 diabetes mellitus with diabetic neuropathy (HCC) ?  Dyslipidemia ?  GERD without esophagitis ?  Class 3 severe obesity with serious comorbidity and body mass index (BMI) of 40.0 to 44.9 in adult [facility]) ?  OSA (obstructive sleep apnea) ? ?Resolved Problems: ?  * No resolved hospital problems. * ? ?Hospital Course: ?This 56 year old female with past medical history significant for hypertension, diabetes, diastolic heart failure, COPD and previous CVA on Plavix who presented to the emergency room on night of 4/4 with 1 day  of abdominal pain along with nausea and vomiting and work-up revealed small bowel obstruction.  Patient had NG tube placed and admitted to the hospitalist service.  General surgery consulted who recommended initial conservative measures. Over the next few days, bowel obstruction slowly improving.  Patient able to have NG tube removed on evening of 4/7 and started on clear liquids which she is tolerating.  Diet advanced to full liquids at 4/8.  Patient tolerating full liquids and diet advanced to solid food as of this morning.  Patient still complains some abdominal discomfort and nausea although not enough that she will is taking Zofran.  She is still quite constipated. ?Patient was admitted for  small bowel obstruction which was managed conservatively and significantly improved.  SBO has resolved.  Patient started on soft bland diet tolerated well.  Patient has developed dizziness on the day of discharge which delayed her discharge date for 1 day.  Patient feels better,  has bowel movement patient is being discharged home. ? ? ?Assessment and Plan: ?Orthostatic hypotension ?Nursing attempted to get patient up on afternoon of 4/11 and she complained of dizziness.  Systolic blood pressure 035, but that was laying down.  Patient has not really been up since admission.  She is likely having orthostatic hypotension brought on by prolonged bed rest. Started her on IV fluids  and have them checking orthostatics on her every shift.  Encouraged to increase mobilization. ? ?Constipation ?Noted on abdominal CT on admission.   ?Now that small bowel obstruction has resolved, Patient still with some significant constipation.  ?Continue Senokot, bowel regimen. ? ?SBO (small bowel obstruction) ([place]) ?Resolved. Suspect her abdominal discomfort may be more from constipation.   ?She had 2 big bowel movements.  Nausea much improved. ? ?AKI (acute kidney injury) ([place]) ?Likely prerenal secondary to nausea and vomiting and volume depletion. ?AKI improved with IV hydration. ? ? ?Hypertensive urgency ?Continue antihypertensives. ? ?Chronic diastolic CHF (congestive heart failure) ([place]) ?Echo: in 2017 noted grade 1 diastolic dysfunction.  Patient appears euvolemic.  Continued IV hydration.  Monitor input and output.  Minimally elevated BNP on 4/8 of 276.  Given 1 dose of IV Lasix.  Pressures a little soft after Lasix.  Suspect that is the cause of her dizziness.  Blood pressure is  now normal. ? ?Seizure disorder ([place]) ?Continue Keppra. ? ?Type 2 diabetes mellitus with diabetic neuropathy ([place]) ?Continue sliding scale. ?Continue Neurontin for her diabetic neuropathy. ? ?Dyslipidemia ?Continue statin therapy. ? ?GERD without  esophagitis ?Continue PPI therapy. ? ?Class 3 severe obesity with serious comorbidity and body mass index (BMI) of 40.0 to 44.9 in adult [facility]) ?Patient meets criteria for morbid obesity with BMI greater than 40. ? ?OSA (obstructive sleep apnea) ?Continue home CPAP ? ? ? ?Pain control - [employer] Controlled Substance Reporting System database was reviewed. and patient was instructed, not to drive, operate heavy machinery, perform activities at heights, swimming or participation in water activities or provide baby-sitting services while on Pain, Sleep and Anxiety Medications; until their outpatient Physician has advised to do so again. Also recommended to not to take more than prescribed Pain, Sleep and Anxiety Medications.  ?Consultants: General surgery ?Procedures performed: None ?Disposition: Home ?Diet recommendation:  ?Discharge Diet Orders (From admission, onward)  ? ?  Start     Ordered  ? 05/01/21 0000  Diet - low sodium heart healthy       ? 05/01/21 1054  ? 05/01/21 0000  Diet Carb Modified       ? 05/01/21 1054  ? ?  ?  ? ?  ? ?Cardiac diet ?DISCHARGE MEDICATION: ?Allergies as of 05/01/2021   ? ?   Reactions  ? Codeine Swelling, Other (See Comments)  ? Swelling and burning of mouth (inside)  ? Lactose Intolerance (gi) Nausea And Vomiting  ? ?  ? ?  ?Medication List  ?  ? ?STOP taking these medications   ? ?hydrochlorothiazide 25 MG tablet ?Commonly known as: HYDRODIURIL ?  ?terconazole 0.4 % vaginal cream ?Commonly known as: TERAZOL 7 ?  ? ?  ? ?TAKE these medications   ? ?Accu-Chek Guide test strip ?Generic drug: glucose blood ?Use as instructed to check blood sugar 4 times per day Dx code E11.9 ?  ?albuterol 1.25 MG/3ML nebulizer solution ?Commonly known as: ACCUNEB ?Inhale 1 ampule into the lungs every 6 (six) hours as needed for wheezing or shortness of breath. ?  ?albuterol 108 (90 Base) MCG/ACT inhaler ?Commonly known as: ProAir HFA ?INHALE 2 PUFFS BY MOUTH EVERY 6 HOURS AS NEEDED FOR WHEEZING OR  SHORTNESS OF BREATH ?  ?amitriptyline 25 MG tablet ?Commonly known as: ELAVIL ?Take 2-3 tablets (50-75 mg total) by mouth at bedtime. ?  ?amLODipine 10 MG tablet ?Commonly known as: NORVASC ?Take 1 tablet by mouth daily. ?  ?baclofen 10 MG tablet ?Commonly known as: LIORESAL ?Take 1 tablet (10 mg total) by mouth 3 (three) times daily. ?  ?budesonide 0.5 MG/2ML nebulizer solution ?Commonly known as: PULMICORT ?Inhale 0.5 mg into the lungs daily. ?  ?butalbital-acetaminophen-caffeine 50-325-40 MG tablet ?Commonly known as: FIORICET ?Take 1-2 tablets by mouth every 6 (six) hours as needed for headache. ?  ?carvedilol 25 MG tablet ?Commonly known as: COREG ?Take 25 mg by mouth 2 (two) times daily. ?  ?cholecalciferol 25 MCG (1000 UNIT) tablet ?Commonly known as: VITAMIN D3 ?Take 1,000 Units by mouth daily. ?  ?ciclopirox 8 % solution ?Commonly known as: PENLAC ?Apply topically at bedtime. ?  ?cloNIDine 0.1 MG tablet ?Commonly known as: Catapres ?Take 1 tablet (0.1 mg total) by mouth 2 (two) times daily. ?  ?clopidogrel 75 MG tablet ?Commonly known as: PLAVIX ?Take 1 tablet (75 mg total) by mouth daily. ?  ?cycloSPORINE 0.05 % ophthalmic emulsion ?Commonly known as: RESTASIS ?Place 2 drops  into both eyes 2 (two) times daily. ?  ?DULoxetine 60 MG capsule ?Commonly known as: Cymbalta ?Take 1 capsule (60 mg total) by mouth daily. ?  ?EPINEPHrine 0.3 mg/0.3 mL Soaj injection ?Commonly known as: EPI-PEN ?Inject 0.3 mg into the muscle as needed for anaphylaxis. ?  ?ezetimibe 10 MG tablet ?Commonly known as: Zetia ?Take 1 tablet (10 mg total) by mouth daily. ?  ?Farxiga 10 MG Tabs tablet ?Generic drug: dapagliflozin propanediol ?TAKE 1 TABLET BY MOUTH EVERY DAY ?  ?fluticasone 50 MCG/ACT nasal spray ?Commonly known as: FLONASE ?Place 2 sprays into both nostrils daily. ?  ?FreeStyle Libre 2 Sensor Misc ?Change every 14 days ?  ?gabapentin 600 MG tablet ?Commonly known as: Neurontin ?600 mg qAM, 600 mg qPM, 1200 mg QHS ?   ?HumaLOG KwikPen 200 UNIT/ML KwikPen ?Generic drug: insulin lispro ?Inject up to 160 units daily in divided doses as directed ?  ?hydrALAZINE 50 MG tablet ?Commonly known as: APRESOLINE ?Take 1 tablet by mouth 3 (thre

## 2021-05-02 ENCOUNTER — Other Ambulatory Visit: Payer: Medicare Other

## 2021-05-02 ENCOUNTER — Telehealth: Payer: Self-pay

## 2021-05-02 NOTE — Telephone Encounter (Signed)
Could not leave a message 2:47pm ?

## 2021-05-05 ENCOUNTER — Telehealth: Payer: Self-pay | Admitting: Family Medicine

## 2021-05-05 ENCOUNTER — Telehealth: Payer: Self-pay

## 2021-05-05 NOTE — Telephone Encounter (Signed)
Transition Care Management Follow-up Telephone Call ?Date of discharge and from where: 05/01/21 [facility] ?How have you been since you were released from the hospital? Pt states she has been feeling dizzy ?Any questions or concerns? No ? ?Items Reviewed: ?Did the pt receive and understand the discharge instructions provided? Yes  ?Medications obtained and verified? Yes  ?Other? No  ?Any new allergies since your discharge? No  ?Dietary orders reviewed? Yes ?Do you have support at home? Yes  ? ?Home Care and Equipment/Supplies: ?Were home health services ordered? yes ?If so, what is the name of the agency? [facility]  ?Has the agency set up a time to come to the patient's home? yes ?Were any new equipment or medical supplies ordered?  No ? ? ?Functional Questionnaire: (I = Independent and D = Dependent) ?ADLs: D ? ?Bathing/Dressing- D ? ?Meal Prep- I ? ?Eating- I ? ?Maintaining continence- I ? ?Transferring/Ambulation- I with cane/walker ? ?Managing Meds- I ? ?Follow up appointments reviewed: ? ?PCP Hospital f/u appt confirmed? Yes  Scheduled to see Serafina Royals NP on 05/13/21 @ 1:20.[name] ?Are transportation arrangements needed? Yes  - pt to schedule ?If their condition worsens, is the pt aware to call PCP or go to the Emergency Dept.? Yes ?Was the patient provided with contact information for the PCP's office or ED? Yes ?Was to pt encouraged to call back with questions or concerns? Yes ? ?

## 2021-05-05 NOTE — Telephone Encounter (Signed)
[facility], tiffany, calling to let the dr know pt declined start of care until this week.  They will attempt visit tues. 05/06/21. ? ? ?Cb [phone] option 2 ?

## 2021-05-05 NOTE — Telephone Encounter (Signed)
Copied from [place] [id number]. Topic: General - Other >> May 05, 2021  2:54 PM Tessa Lerner A wrote: Reason for CRM: Barbara Cower with [facility] has called to share that there are no further social work needs for the patient at this time   Please contact further if needed

## 2021-05-06 NOTE — Telephone Encounter (Signed)
Tiffany states she needs a verbal for resumption start of care... ? ?  ?Cb [phone] option 2 ?

## 2021-05-06 NOTE — Telephone Encounter (Signed)
Verbal orders given to [place]. ?

## 2021-05-07 ENCOUNTER — Ambulatory Visit: Payer: Medicare Other | Admitting: Endocrinology

## 2021-05-08 ENCOUNTER — Ambulatory Visit (INDEPENDENT_AMBULATORY_CARE_PROVIDER_SITE_OTHER): Payer: Medicare Other

## 2021-05-08 ENCOUNTER — Telehealth: Payer: Self-pay | Admitting: Family Medicine

## 2021-05-08 DIAGNOSIS — E1169 Type 2 diabetes mellitus with other specified complication: Secondary | ICD-10-CM

## 2021-05-08 DIAGNOSIS — Z9181 History of falling: Secondary | ICD-10-CM

## 2021-05-08 DIAGNOSIS — K56609 Unspecified intestinal obstruction, unspecified as to partial versus complete obstruction: Secondary | ICD-10-CM

## 2021-05-08 NOTE — Telephone Encounter (Signed)
Copied from [place] [id number]. Topic: Quick Communication - Home Health Verbal Orders ?>> May 08, 2021 10:30 AM Leward Quan A wrote: ?Caller/Agency: Gerald Stabs // [facility]  ?Callback Number: [phone] [place] to [name]  ?Requesting OT/PT/Skilled Nursing/Social Work/Speech Therapy: PT  ?Frequency: 2w2, 1w2 ?

## 2021-05-08 NOTE — Chronic Care Management (AMB) (Signed)
?Chronic Care Management  ? ?CCM RN Visit Note ? ?05/08/2021 ?Name: [name] MRN: [id number] DOB: May 19, 1965 ? ?Subjective: ?[name] is a 56 y.o. year old female who is a primary care patient of Delsa Grana, [place]. The care management team was consulted for assistance with disease management and care coordination needs.   ? ?Engaged with patient by telephone for follow up visit in response to provider referral for case management and care coordination services.  ? ?Consent to Services:  ?The patient was given information about Chronic Care Management services, agreed to services, and gave verbal consent prior to initiation of services.  Please see initial visit note for detailed documentation.  ? ?Assessment: Review of patient past medical history, allergies, medications, health status, including review of consultants reports, laboratory and other test data, was performed as part of comprehensive evaluation and provision of chronic care management services.  ? ?SDOH (Social Determinants of Health) assessments and interventions performed: No ? ?CCM Care Plan ? ?Allergies  ?Allergen Reactions  ? Codeine Swelling and Other (See Comments)  ?  Swelling and burning of mouth (inside)  ? Lactose Intolerance (Gi) Nausea And Vomiting  ? ? ?Outpatient Encounter Medications as of 05/08/2021  ?Medication Sig  ? albuterol (ACCUNEB) 1.25 MG/3ML nebulizer solution Inhale 1 ampule into the lungs every 6 (six) hours as needed for wheezing or shortness of breath.  ? albuterol (PROAIR HFA) 108 (90 Base) MCG/ACT inhaler INHALE 2 PUFFS BY MOUTH EVERY 6 HOURS AS NEEDED FOR WHEEZING OR SHORTNESS OF BREATH  ? amitriptyline (ELAVIL) 25 MG tablet Take 2-3 tablets (50-75 mg total) by mouth at bedtime.  ? amLODipine (NORVASC) 10 MG tablet Take 1 tablet by mouth daily.  ? baclofen (LIORESAL) 10 MG tablet Take 1 tablet (10 mg total) by mouth 3 (three) times daily.  ? budesonide (PULMICORT) 0.5 MG/2ML nebulizer solution  Inhale 0.5 mg into the lungs daily.  ? butalbital-acetaminophen-caffeine (FIORICET) 50-325-40 MG tablet Take 1-2 tablets by mouth every 6 (six) hours as needed for headache.  ? carvedilol (COREG) 25 MG tablet Take 25 mg by mouth 2 (two) times daily.  ? cholecalciferol (VITAMIN D3) 25 MCG (1000 UNIT) tablet Take 1,000 Units by mouth daily.  ? ciclopirox (PENLAC) 8 % solution Apply topically at bedtime.  ? cloNIDine (CATAPRES) 0.1 MG tablet Take 1 tablet (0.1 mg total) by mouth 2 (two) times daily.  ? clopidogrel (PLAVIX) 75 MG tablet Take 1 tablet (75 mg total) by mouth daily.  ? Continuous Blood Gluc Sensor (FREESTYLE LIBRE 2 SENSOR) MISC Change every 14 days  ? cycloSPORINE (RESTASIS) 0.05 % ophthalmic emulsion Place 2 drops into both eyes 2 (two) times daily.  ? Dulaglutide (TRULICITY) 4.5 SJ/6.2EZ SOPN Inject 4.5 mg as directed once a week.  ? DULoxetine (CYMBALTA) 60 MG capsule Take 1 capsule (60 mg total) by mouth daily.  ? EPINEPHrine 0.3 mg/0.3 mL IJ SOAJ injection Inject 0.3 mg into the muscle as needed for anaphylaxis.  ? ezetimibe (ZETIA) 10 MG tablet Take 1 tablet (10 mg total) by mouth daily.  ? FARXIGA 10 MG TABS tablet TAKE 1 TABLET BY MOUTH EVERY DAY  ? fluticasone (FLONASE) 50 MCG/ACT nasal spray Place 2 sprays into both nostrils daily.  ? gabapentin (NEURONTIN) 600 MG tablet 600 mg qAM, 600 mg qPM, 1200 mg QHS  ? glucose blood (ACCU-CHEK GUIDE) test strip Use as instructed to check blood sugar 4 times per day Dx code E11.9  ? hydrALAZINE (APRESOLINE) 50 MG tablet  Take 1 tablet by mouth 3 (three) times daily.  ? Insulin Disposable Pump (OMNIPOD DASH PODS, GEN 4,) MISC Change pod once daily  ? insulin lispro (HUMALOG KWIKPEN) 200 UNIT/ML KwikPen Inject up to 160 units daily in divided doses as directed  ? levETIRAcetam (KEPPRA) 500 MG tablet Take 500-750 mg by mouth See admin instructions. Take 1 tablet (516m) by mouth every morning and take 1? tablets (7562m by mouth every night  ? mirabegron ER  (MYRBETRIQ) 50 MG TB24 tablet Take 1 tablet (50 mg total) by mouth daily.  ? nitroGLYCERIN (NITROSTAT) 0.4 MG SL tablet Place 0.4 mg under the tongue every 5 (five) minutes as needed for chest pain.  ? olmesartan-hydrochlorothiazide (BENICAR HCT) 40-25 MG tablet TAKE 1 TABLET BY MOUTH EVERY DAY  ? pantoprazole (PROTONIX) 20 MG tablet Take 1 tablet (20 mg total) by mouth daily.  ? polyethylene glycol (MIRALAX / GLYCOLAX) 17 g packet Take 17 g by mouth daily as needed for mild constipation.  ? potassium chloride SA (KLOR-CON M20) 20 MEQ tablet You have not needed potassium while in the hospital.  Hold until outpatient followup. (Patient taking differently: Take 20 mEq by mouth 2 (two) times daily.)  ? rosuvastatin (CRESTOR) 10 MG tablet Take 1 tablet (10 mg total) by mouth daily.  ? spironolactone (ALDACTONE) 25 MG tablet Take 25 mg by mouth daily.  ? WIXELA INHUB 250-50 MCG/DOSE AEPB Inhale 1 puff into the lungs 2 (two) times daily.  ? ?No facility-administered encounter medications on file as of 05/08/2021.  ? ? ?Patient Active Problem List  ? Diagnosis Date Noted  ? Orthostatic hypotension 04/29/2021  ? Constipation 04/28/2021  ? SBO (small bowel obstruction) (HCHull04/05/2021  ? Hypertensive urgency 04/23/2021  ? GERD without esophagitis 04/23/2021  ? AKI (acute kidney injury) (HCTur[place]04/05/2021  ? Type 2 diabetes mellitus with diabetic neuropathy (HCBuffalo Springs04/05/2021  ? Dyslipidemia 04/23/2021  ? Chronic diastolic CHF (congestive heart failure) (HCAguas Claras04/05/2021  ? Cervical spondylosis 02/12/2021  ? Lumbar facet arthropathy 02/12/2021  ? Recurrent UTI 11/13/2020  ? Angina pectoris (HCGerald10/26/2022  ? Major depressive disorder, recurrent episode, moderate (HCHamilton Square10/26/2022  ? Weakness of right arm   ? Adrenal adenoma, right   ? PAD (peripheral artery disease) (HCSequoia Crest01/05/2020  ? Abnormal ankle brachial index (ABI) 12/18/2019  ? CAD (coronary artery disease) 04/27/2019  ? Myalgia 04/19/2019  ? Diabetic polyneuropathy  associated with type 2 diabetes mellitus (HCWeaubleau02/12/2018  ? Vitamin D deficiency 01/24/2018  ? Overactive bladder 05/18/2017  ? Dilated aortic root (HCNew Village  ? Sacroiliac dysfunction 10/09/2014  ? Chronic low back pain 09/03/2014  ? CVA, old, hemiparesis (HCOvid08/15/2016  ? OSA (obstructive sleep apnea) 05/15/2014  ? Type 2 diabetes mellitus with chronic kidney disease, with long-term current use of insulin (HCAlber[place]01/03/2014  ? Chronic pain syndrome 01/21/2014  ? Headache disorder 09/07/2013  ? Mixed urge and stress incontinence 08/25/2011  ? Ureteral stricture, right 08/25/2011  ? Pyelonephritis 08/22/2011  ? Nephrolithiasis 08/22/2011  ? Incisional hernia 05/10/2011  ? Class 3 severe obesity with serious comorbidity and body mass index (BMI) of 40.0 to 44.9 in adult (HT[facility]04/21/2013  ? BLINDNESS, LEMucarabonesUSCanadaEFINITION 09/14/2006  ? Hyperlipemia 09/13/2006  ? Accelerated hypertension 09/13/2006  ? GERD 09/13/2006  ? Seizure disorder (HCWashington Park08/25/2008  ? ? ? ?PLAN ?A member of the care management team will follow up within the next month. ? ? ?Terriyah Westra,RN ?[place]/[facility] Care Management ?[facility]([phone] ? ? ? ? ? ? ?

## 2021-05-12 NOTE — Telephone Encounter (Signed)
Completed.

## 2021-05-13 ENCOUNTER — Other Ambulatory Visit: Payer: Self-pay

## 2021-05-13 ENCOUNTER — Ambulatory Visit (INDEPENDENT_AMBULATORY_CARE_PROVIDER_SITE_OTHER): Payer: Medicare Other | Admitting: Nurse Practitioner

## 2021-05-13 ENCOUNTER — Encounter: Payer: Self-pay | Admitting: Nurse Practitioner

## 2021-05-13 VITALS — BP 118/74 | HR 100 | Temp 98.7°F | Resp 16 | Ht 66.0 in | Wt 256.6 lb

## 2021-05-13 DIAGNOSIS — N179 Acute kidney failure, unspecified: Secondary | ICD-10-CM | POA: Diagnosis not present

## 2021-05-13 DIAGNOSIS — I951 Orthostatic hypotension: Secondary | ICD-10-CM | POA: Diagnosis not present

## 2021-05-13 DIAGNOSIS — Z09 Encounter for follow-up examination after completed treatment for conditions other than malignant neoplasm: Secondary | ICD-10-CM

## 2021-05-13 DIAGNOSIS — K56609 Unspecified intestinal obstruction, unspecified as to partial versus complete obstruction: Secondary | ICD-10-CM

## 2021-05-13 LAB — COMPLETE METABOLIC PANEL WITH GFR
AG Ratio: 1.3 (calc) (ref 1.0–2.5)
ALT: 19 U/L (ref 6–29)
AST: 13 U/L (ref 10–35)
Albumin: 4 g/dL (ref 3.6–5.1)
Alkaline phosphatase (APISO): 76 U/L (ref 37–153)
BUN/Creatinine Ratio: 17 (calc) (ref 6–22)
BUN: 21 mg/dL (ref 7–25)
CO2: 33 mmol/L — ABNORMAL HIGH (ref 20–32)
Calcium: 9.6 mg/dL (ref 8.6–10.4)
Chloride: 100 mmol/L (ref 98–110)
Creat: 1.26 mg/dL — ABNORMAL HIGH (ref 0.50–1.03)
Globulin: 3.1 g/dL (calc) (ref 1.9–3.7)
Glucose, Bld: 115 mg/dL — ABNORMAL HIGH (ref 65–99)
Potassium: 4 mmol/L (ref 3.5–5.3)
Sodium: 142 mmol/L (ref 135–146)
Total Bilirubin: 0.3 mg/dL (ref 0.2–1.2)
Total Protein: 7.1 g/dL (ref 6.1–8.1)
eGFR: 50 mL/min/{1.73_m2} — ABNORMAL LOW (ref >=60)

## 2021-05-13 NOTE — Progress Notes (Signed)
? ?BP 118/74   Pulse 100   Temp 98.7 ?F (37.1 ?C) (Oral)   Resp 16   Ht 5' 6"  (1.676 m)   Wt 256 lb 9.6 oz (116.4 kg)   LMP  (LMP Unknown)   SpO2 97%   BMI 41.42 kg/m?   ? ?Subjective:  ? ? Patient ID: [name], female    DOB: 21-Mar-1965, 56 y.o.   MRN: [id number] ? ?HPI: ?[name] is a 56 y.o. female ? ?Chief Complaint  ?Patient presents with  ? Hospitalization Follow-up  ? ?Hospital follow up/ small bowel obstruction/AKI/hypotension: Patient was seen in the emergency department at [facility] on April 22, 2021.  She was there for abdominal pain.  She was diagnosed with a small bowel obstruction.  CT abdomen pelvis showed Dilated stomach, proximal and mid small bowel loops with fluid and ?air-fluid levels compatible with mid to distal small bowel ?obstruction. Exact transition or cause not visualized. ?NG tube was placed she was admitted to the hospitalist service.  Her NG tube was removed on April 25, 2021 she was started on a clear liquid diet which she tolerated her diet advanced as tolerated.  Patient had episode of orthostatic hypotension on April 29, 2021.  Hospitalist had patient stop taking hydrochlorothiazide 25 mg daily.  Blood pressure is fine today 118/74. ?She was discharged on 05/01/2021 patient reports she is feeling good she is having routine bowel movements, no abdominal pain, no nausea, no vomiting.  She did have an acute kidney injury due to dehydration, she was treated with IV fluids.  We will get labs today to recheck.  Patient reports that she does have home health, physical and Occupational Therapy.  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 ? ?Relevant past medical, surgical, family and social history reviewed and updated as indicated. Interim medical history since our last visit reviewed. ?Allergies and medications reviewed and updated. ? ?Review of Systems ? ?Constitutional: Negative for fever or weight change.  ?Respiratory: Negative for cough and  shortness of breath.   ?Cardiovascular: Negative for chest pain or palpitations.  ?Gastrointestinal: Negative for abdominal pain, no bowel changes.  ?Musculoskeletal: Negative for gait problem or joint swelling.  ?Skin: Negative for rash.  ?Neurological: Negative for dizziness or headache.  ?No other specific complaints in a complete review of systems (except as listed in HPI above).  ? ?   ?Objective:  ?  ?BP 118/74   Pulse 100   Temp 98.7 ?F (37.1 ?C) (Oral)   Resp 16   Ht 5' 6"  (1.676 m)   Wt 256 lb 9.6 oz (116.4 kg)   LMP  (LMP Unknown)   SpO2 97%   BMI 41.42 kg/m?   ?Wt Readings from Last 3 Encounters:  ?05/13/21 256 lb 9.6 oz (116.4 kg)  ?04/22/21 258 lb (117 kg)  ?03/26/21 265 lb 3.2 oz (120.3 kg)  ?  ?Physical Exam ? ?Constitutional: Patient appears well-developed and well-nourished. Obese  No distress.  ?HEENT: head atraumatic, normocephalic, pupils equal and reactive to light,  neck supple ?Cardiovascular: Normal rate, regular rhythm and normal heart sounds.  No murmur heard. No BLE edema. ?Pulmonary/Chest: Effort normal and breath sounds normal. No respiratory distress. ?Abdominal: Soft.  There is no tenderness. ?Psychiatric: Patient has a normal mood and affect. behavior is normal. Judgment and thought content normal.  ?Results for orders placed or performed during the hospital encounter of 04/22/21  ?Lipase, blood  ?Result Value Ref Range  ? Lipase  28 11 - 51 U/L  ?Comprehensive metabolic panel  ?Result Value Ref Range  ? Sodium 137 135 - 145 mmol/L  ? Potassium 4.0 3.5 - 5.1 mmol/L  ? Chloride 95 (L) 98 - 111 mmol/L  ? CO2 26 22 - 32 mmol/L  ? Glucose, Bld 195 (H) 70 - 99 mg/dL  ? BUN 27 (H) 6 - 20 mg/dL  ? Creatinine, Ser 1.56 (H) 0.44 - 1.00 mg/dL  ? Calcium 10.6 (H) 8.9 - 10.3 mg/dL  ? Total Protein 10.0 (H) 6.5 - 8.1 g/dL  ? Albumin 4.9 3.5 - 5.0 g/dL  ? AST 22 15 - 41 U/L  ? ALT 26 0 - 44 U/L  ? Alkaline Phosphatase 97 38 - 126 U/L  ? Total Bilirubin 0.6 0.3 - 1.2 mg/dL  ? GFR, Estimated  39 (L) >60 mL/min  ? Anion gap 16 (H) 5 - 15  ?CBC  ?Result Value Ref Range  ? WBC 12.5 (H) 4.0 - 10.5 K/uL  ? RBC 5.67 (H) 3.87 - 5.11 MIL/uL  ? Hemoglobin 16.0 (H) 12.0 - 15.0 g/dL  ? HCT 49.8 (H) 36.0 - 46.0 %  ? MCV 87.8 80.0 - 100.0 fL  ? MCH 28.2 26.0 - 34.0 pg  ? MCHC 32.1 30.0 - 36.0 g/dL  ? RDW 14.2 11.5 - 15.5 %  ? Platelets 405 (H) 150 - 400 K/uL  ? nRBC 0.0 0.0 - 0.2 %  ?Basic metabolic panel  ?Result Value Ref Range  ? Sodium 140 135 - 145 mmol/L  ? Potassium 4.0 3.5 - 5.1 mmol/L  ? Chloride 99 98 - 111 mmol/L  ? CO2 30 22 - 32 mmol/L  ? Glucose, Bld 180 (H) 70 - 99 mg/dL  ? BUN 27 (H) 6 - 20 mg/dL  ? Creatinine, Ser 1.49 (H) 0.44 - 1.00 mg/dL  ? Calcium 8.6 (L) 8.9 - 10.3 mg/dL  ? GFR, Estimated 41 (L) >60 mL/min  ? Anion gap 11 5 - 15  ?CBC  ?Result Value Ref Range  ? WBC 12.8 (H) 4.0 - 10.5 K/uL  ? RBC 4.71 3.87 - 5.11 MIL/uL  ? Hemoglobin 13.4 12.0 - 15.0 g/dL  ? HCT 42.7 36.0 - 46.0 %  ? MCV 90.7 80.0 - 100.0 fL  ? MCH 28.5 26.0 - 34.0 pg  ? MCHC 31.4 30.0 - 36.0 g/dL  ? RDW 14.4 11.5 - 15.5 %  ? Platelets 327 150 - 400 K/uL  ? nRBC 0.0 0.0 - 0.2 %  ?Protime-INR  ?Result Value Ref Range  ? Prothrombin Time 13.7 11.4 - 15.2 seconds  ? INR 1.1 0.8 - 1.2  ?CBC  ?Result Value Ref Range  ? WBC 8.6 4.0 - 10.5 K/uL  ? RBC 4.25 3.87 - 5.11 MIL/uL  ? Hemoglobin 12.0 12.0 - 15.0 g/dL  ? HCT 39.3 36.0 - 46.0 %  ? MCV 92.5 80.0 - 100.0 fL  ? MCH 28.2 26.0 - 34.0 pg  ? MCHC 30.5 30.0 - 36.0 g/dL  ? RDW 15.0 11.5 - 15.5 %  ? Platelets 295 150 - 400 K/uL  ? nRBC 0.0 0.0 - 0.2 %  ?Basic metabolic panel  ?Result Value Ref Range  ? Sodium 143 135 - 145 mmol/L  ? Potassium 3.8 3.5 - 5.1 mmol/L  ? Chloride 105 98 - 111 mmol/L  ? CO2 29 22 - 32 mmol/L  ? Glucose, Bld 134 (H) 70 - 99 mg/dL  ? BUN 32 (H) 6 - 20 mg/dL  ?  Creatinine, Ser 1.53 (H) 0.44 - 1.00 mg/dL  ? Calcium 8.0 (L) 8.9 - 10.3 mg/dL  ? GFR, Estimated 40 (L) >60 mL/min  ? Anion gap 9 5 - 15  ?Magnesium  ?Result Value Ref Range  ? Magnesium 2.4 1.7 - 2.4  mg/dL  ?Brain natriuretic peptide  ?Result Value Ref Range  ? B Natriuretic Peptide 276.7 (H) 0.0 - 100.0 pg/mL  ?Basic metabolic panel  ?Result Value Ref Range  ? Sodium 140 135 - 145 mmol/L  ? Potassium 4.5 3.5 - 5.1 mmol/L  ? Chloride 107 98 - 111 mmol/L  ? CO2 25 22 - 32 mmol/L  ? Glucose, Bld 152 (H) 70 - 99 mg/dL  ? BUN 24 (H) 6 - 20 mg/dL  ? Creatinine, Ser 0.98 0.44 - 1.00 mg/dL  ? Calcium 8.3 (L) 8.9 - 10.3 mg/dL  ? GFR, Estimated >60 >60 mL/min  ? Anion gap 8 5 - 15  ?Hemoglobin A1c  ?Result Value Ref Range  ? Hgb A1c MFr Bld 7.5 (H) 4.8 - 5.6 %  ? Mean Plasma Glucose 168.55 mg/dL  ?Glucose, capillary  ?Result Value Ref Range  ? Glucose-Capillary 146 (H) 70 - 99 mg/dL  ?Glucose, capillary  ?Result Value Ref Range  ? Glucose-Capillary 188 (H) 70 - 99 mg/dL  ? Comment 1 Notify RN   ?Glucose, capillary  ?Result Value Ref Range  ? Glucose-Capillary 152 (H) 70 - 99 mg/dL  ?Glucose, capillary  ?Result Value Ref Range  ? Glucose-Capillary 144 (H) 70 - 99 mg/dL  ?Glucose, capillary  ?Result Value Ref Range  ? Glucose-Capillary 156 (H) 70 - 99 mg/dL  ?Glucose, capillary  ?Result Value Ref Range  ? Glucose-Capillary 179 (H) 70 - 99 mg/dL  ?Glucose, capillary  ?Result Value Ref Range  ? Glucose-Capillary 135 (H) 70 - 99 mg/dL  ?Glucose, capillary  ?Result Value Ref Range  ? Glucose-Capillary 145 (H) 70 - 99 mg/dL  ?Glucose, capillary  ?Result Value Ref Range  ? Glucose-Capillary 191 (H) 70 - 99 mg/dL  ?Glucose, capillary  ?Result Value Ref Range  ? Glucose-Capillary 183 (H) 70 - 99 mg/dL  ?Glucose, capillary  ?Result Value Ref Range  ? Glucose-Capillary 199 (H) 70 - 99 mg/dL  ?Glucose, capillary  ?Result Value Ref Range  ? Glucose-Capillary 212 (H) 70 - 99 mg/dL  ?Glucose, capillary  ?Result Value Ref Range  ? Glucose-Capillary 186 (H) 70 - 99 mg/dL  ?Glucose, capillary  ?Result Value Ref Range  ? Glucose-Capillary 227 (H) 70 - 99 mg/dL  ?Glucose, capillary  ?Result Value Ref Range  ? Glucose-Capillary 202 (H) 70 -  99 mg/dL  ?Glucose, capillary  ?Result Value Ref Range  ? Glucose-Capillary 287 (H) 70 - 99 mg/dL  ?Glucose, capillary  ?Result Value Ref Range  ? Glucose-Capillary 190 (H) 70 - 99 mg/dL  ? Comment 1 Notify RN   ? Comment 2 Doc

## 2021-05-14 ENCOUNTER — Telehealth: Payer: Self-pay

## 2021-05-14 ENCOUNTER — Other Ambulatory Visit: Payer: Self-pay

## 2021-05-14 DIAGNOSIS — E1165 Type 2 diabetes mellitus with hyperglycemia: Secondary | ICD-10-CM

## 2021-05-14 MED ORDER — FREESTYLE LIBRE 2 SENSOR MISC: 3 refills | Status: DC

## 2021-05-14 MED ORDER — PEN NEEDLES 32G X 4 MM MISC: 2 refills | Status: DC

## 2021-05-14 NOTE — Telephone Encounter (Signed)
Done

## 2021-05-14 NOTE — Telephone Encounter (Signed)
Copied from [place] [phone]. Topic: Quick Communication - Home Health Verbal Orders ?>> May 13, 2021  5:00 PM Loma Boston wrote: ?Caller/Agency:[facility] ?Callback Number: [phone] ?Requesting OT for 2 wks 2 times ?

## 2021-05-18 ENCOUNTER — Emergency Department: Payer: Medicare Other

## 2021-05-18 ENCOUNTER — Inpatient Hospital Stay: Payer: Medicare Other

## 2021-05-18 ENCOUNTER — Encounter: Payer: Self-pay | Admitting: Emergency Medicine

## 2021-05-18 ENCOUNTER — Other Ambulatory Visit: Payer: Self-pay

## 2021-05-18 ENCOUNTER — Inpatient Hospital Stay
Admission: EM | Admit: 2021-05-18 | Discharge: 2021-05-24 | DRG: 389 | Disposition: A | Discharge status: Home Health | Payer: Medicare Other | Attending: Internal Medicine | Admitting: Internal Medicine

## 2021-05-18 DIAGNOSIS — Z6841 Body Mass Index (BMI) 40.0 and over, adult: Secondary | ICD-10-CM

## 2021-05-18 DIAGNOSIS — E1169 Type 2 diabetes mellitus with other specified complication: Secondary | ICD-10-CM

## 2021-05-18 DIAGNOSIS — J449 Chronic obstructive pulmonary disease, unspecified: Secondary | ICD-10-CM | POA: Diagnosis present

## 2021-05-18 DIAGNOSIS — G4733 Obstructive sleep apnea (adult) (pediatric): Secondary | ICD-10-CM | POA: Diagnosis present

## 2021-05-18 DIAGNOSIS — N393 Stress incontinence (female) (male): Secondary | ICD-10-CM | POA: Diagnosis present

## 2021-05-18 DIAGNOSIS — Z794 Long term (current) use of insulin: Secondary | ICD-10-CM | POA: Diagnosis not present

## 2021-05-18 DIAGNOSIS — I5032 Chronic diastolic (congestive) heart failure: Secondary | ICD-10-CM | POA: Diagnosis present

## 2021-05-18 DIAGNOSIS — Z7984 Long term (current) use of oral hypoglycemic drugs: Secondary | ICD-10-CM

## 2021-05-18 DIAGNOSIS — R109 Unspecified abdominal pain: Secondary | ICD-10-CM | POA: Diagnosis present

## 2021-05-18 DIAGNOSIS — Z86718 Personal history of other venous thrombosis and embolism: Secondary | ICD-10-CM

## 2021-05-18 DIAGNOSIS — Z808 Family history of malignant neoplasm of other organs or systems: Secondary | ICD-10-CM

## 2021-05-18 DIAGNOSIS — F32A Depression, unspecified: Secondary | ICD-10-CM | POA: Diagnosis present

## 2021-05-18 DIAGNOSIS — N181 Chronic kidney disease, stage 1: Secondary | ICD-10-CM | POA: Diagnosis not present

## 2021-05-18 DIAGNOSIS — R569 Unspecified convulsions: Secondary | ICD-10-CM | POA: Diagnosis not present

## 2021-05-18 DIAGNOSIS — I69354 Hemiplegia and hemiparesis following cerebral infarction affecting left non-dominant side: Secondary | ICD-10-CM | POA: Diagnosis not present

## 2021-05-18 DIAGNOSIS — E1122 Type 2 diabetes mellitus with diabetic chronic kidney disease: Secondary | ICD-10-CM | POA: Diagnosis present

## 2021-05-18 DIAGNOSIS — K56600 Partial intestinal obstruction, unspecified as to cause: Secondary | ICD-10-CM | POA: Diagnosis not present

## 2021-05-18 DIAGNOSIS — Z79899 Other long term (current) drug therapy: Secondary | ICD-10-CM

## 2021-05-18 DIAGNOSIS — Z885 Allergy status to narcotic agent status: Secondary | ICD-10-CM

## 2021-05-18 DIAGNOSIS — K56609 Unspecified intestinal obstruction, unspecified as to partial versus complete obstruction: Secondary | ICD-10-CM | POA: Diagnosis present

## 2021-05-18 DIAGNOSIS — G40A09 Absence epileptic syndrome, not intractable, without status epilepticus: Secondary | ICD-10-CM

## 2021-05-18 DIAGNOSIS — E66813 Obesity, class 3: Secondary | ICD-10-CM | POA: Diagnosis present

## 2021-05-18 DIAGNOSIS — E785 Hyperlipidemia, unspecified: Secondary | ICD-10-CM

## 2021-05-18 DIAGNOSIS — K219 Gastro-esophageal reflux disease without esophagitis: Secondary | ICD-10-CM | POA: Diagnosis present

## 2021-05-18 DIAGNOSIS — I10 Essential (primary) hypertension: Secondary | ICD-10-CM | POA: Diagnosis not present

## 2021-05-18 DIAGNOSIS — D3501 Benign neoplasm of right adrenal gland: Secondary | ICD-10-CM | POA: Diagnosis present

## 2021-05-18 DIAGNOSIS — I130 Hypertensive heart and chronic kidney disease with heart failure and stage 1 through stage 4 chronic kidney disease, or unspecified chronic kidney disease: Secondary | ICD-10-CM | POA: Diagnosis present

## 2021-05-18 DIAGNOSIS — E739 Lactose intolerance, unspecified: Secondary | ICD-10-CM | POA: Diagnosis present

## 2021-05-18 DIAGNOSIS — Z8049 Family history of malignant neoplasm of other genital organs: Secondary | ICD-10-CM

## 2021-05-18 DIAGNOSIS — Z8249 Family history of ischemic heart disease and other diseases of the circulatory system: Secondary | ICD-10-CM

## 2021-05-18 DIAGNOSIS — Z7902 Long term (current) use of antithrombotics/antiplatelets: Secondary | ICD-10-CM

## 2021-05-18 DIAGNOSIS — I252 Old myocardial infarction: Secondary | ICD-10-CM | POA: Diagnosis not present

## 2021-05-18 DIAGNOSIS — Z833 Family history of diabetes mellitus: Secondary | ICD-10-CM

## 2021-05-18 DIAGNOSIS — H548 Legal blindness, as defined in USA: Secondary | ICD-10-CM | POA: Diagnosis present

## 2021-05-18 DIAGNOSIS — N1831 Chronic kidney disease, stage 3a: Secondary | ICD-10-CM | POA: Diagnosis present

## 2021-05-18 DIAGNOSIS — E1140 Type 2 diabetes mellitus with diabetic neuropathy, unspecified: Secondary | ICD-10-CM | POA: Diagnosis present

## 2021-05-18 DIAGNOSIS — Z7951 Long term (current) use of inhaled steroids: Secondary | ICD-10-CM

## 2021-05-18 DIAGNOSIS — K567 Ileus, unspecified: Secondary | ICD-10-CM | POA: Diagnosis not present

## 2021-05-18 DIAGNOSIS — E11319 Type 2 diabetes mellitus with unspecified diabetic retinopathy without macular edema: Secondary | ICD-10-CM | POA: Diagnosis present

## 2021-05-18 DIAGNOSIS — Z87891 Personal history of nicotine dependence: Secondary | ICD-10-CM

## 2021-05-18 DIAGNOSIS — Z87442 Personal history of urinary calculi: Secondary | ICD-10-CM

## 2021-05-18 DIAGNOSIS — Z90710 Acquired absence of both cervix and uterus: Secondary | ICD-10-CM

## 2021-05-18 DIAGNOSIS — E11649 Type 2 diabetes mellitus with hypoglycemia without coma: Secondary | ICD-10-CM | POA: Diagnosis not present

## 2021-05-18 HISTORY — DX: Unspecified intestinal obstruction, unspecified as to partial versus complete obstruction: K56.609

## 2021-05-18 LAB — COMPREHENSIVE METABOLIC PANEL
ALT: 20 U/L (ref 0–44)
AST: 19 U/L (ref 15–41)
Albumin: 4.1 g/dL (ref 3.5–5.0)
Alkaline Phosphatase: 89 U/L (ref 38–126)
Anion gap: 12 (ref 5–15)
BUN: 18 mg/dL (ref 6–20)
CO2: 30 mmol/L (ref 22–32)
Calcium: 10 mg/dL (ref 8.9–10.3)
Chloride: 96 mmol/L — ABNORMAL LOW (ref 98–111)
Creatinine, Ser: 1.33 mg/dL — ABNORMAL HIGH (ref 0.44–1.00)
GFR, Estimated: 47 mL/min — ABNORMAL LOW (ref >60)
Glucose, Bld: 169 mg/dL — ABNORMAL HIGH (ref 70–99)
Potassium: 3.5 mmol/L (ref 3.5–5.1)
Sodium: 138 mmol/L (ref 135–145)
Total Bilirubin: 0.6 mg/dL (ref 0.3–1.2)
Total Protein: 8.8 g/dL — ABNORMAL HIGH (ref 6.5–8.1)

## 2021-05-18 LAB — CBC
HCT: 45.3 % (ref 36.0–46.0)
Hemoglobin: 14 g/dL (ref 12.0–15.0)
MCH: 28.3 pg (ref 26.0–34.0)
MCHC: 30.9 g/dL (ref 30.0–36.0)
MCV: 91.5 fL (ref 80.0–100.0)
Platelets: 325 10*3/uL (ref 150–400)
RBC: 4.95 MIL/uL (ref 3.87–5.11)
RDW: 14.2 % (ref 11.5–15.5)
WBC: 8.8 10*3/uL (ref 4.0–10.5)
nRBC: 0 % (ref 0.0–0.2)

## 2021-05-18 LAB — GLUCOSE, CAPILLARY
Glucose-Capillary: 95 mg/dL (ref 70–99)
Glucose-Capillary: 60 mg/dL — ABNORMAL LOW (ref 70–99)
Glucose-Capillary: 87 mg/dL (ref 70–99)
Glucose-Capillary: 128 mg/dL — ABNORMAL HIGH (ref 70–99)
Glucose-Capillary: 175 mg/dL — ABNORMAL HIGH (ref 70–99)

## 2021-05-18 LAB — LIPASE, BLOOD: Lipase: 28 U/L (ref 11–51)

## 2021-05-18 MED ORDER — ONDANSETRON HCL 4 MG/2ML IJ SOLN
4.0000 mg | Freq: Once | INTRAMUSCULAR | Status: AC
  Administered 2021-05-18: 4 mg via INTRAVENOUS
  Filled 2021-05-18: qty 2

## 2021-05-18 MED ORDER — IOHEXOL 300 MG/ML  SOLN
100.0000 mL | Freq: Once | INTRAMUSCULAR | Status: AC | PRN
  Administered 2021-05-18: 100 mL via INTRAVENOUS

## 2021-05-18 MED ORDER — PANTOPRAZOLE SODIUM 40 MG IV SOLR
40.0000 mg | INTRAVENOUS | Status: DC
Start: 2021-05-18 — End: 2021-05-22
  Administered 2021-05-18 – 2021-05-21 (×4): 40 mg via INTRAVENOUS
  Filled 2021-05-18 (×4): qty 10

## 2021-05-18 MED ORDER — KCL IN DEXTROSE-NACL 40-5-0.9 MEQ/L-%-% IV SOLN
INTRAVENOUS | Status: DC
Start: 2021-05-18 — End: 2021-05-19
  Filled 2021-05-18 (×4): qty 1000

## 2021-05-18 MED ORDER — ALBUTEROL SULFATE (2.5 MG/3ML) 0.083% IN NEBU: 3.0000 mL | INHALATION_SOLUTION | Freq: Four times a day (QID) | RESPIRATORY_TRACT | Status: DC | PRN

## 2021-05-18 MED ORDER — ONDANSETRON HCL 4 MG PO TABS: 4.0000 mg | ORAL_TABLET | Freq: Four times a day (QID) | ORAL | Status: DC | PRN

## 2021-05-18 MED ORDER — BUDESONIDE 0.5 MG/2ML IN SUSP: 0.5000 mg | Freq: Every day | RESPIRATORY_TRACT | Status: DC

## 2021-05-18 MED ORDER — SODIUM CHLORIDE 0.9 % IV BOLUS
1000.0000 mL | Freq: Once | INTRAVENOUS | Status: AC
Start: 2021-05-18 — End: 2021-05-18
  Administered 2021-05-18: 1000 mL via INTRAVENOUS

## 2021-05-18 MED ORDER — HYDRALAZINE HCL 20 MG/ML IJ SOLN: 10.0000 mg | Freq: Four times a day (QID) | INTRAMUSCULAR | Status: DC | PRN

## 2021-05-18 MED ORDER — MORPHINE SULFATE (PF) 4 MG/ML IV SOLN
4.0000 mg | Freq: Once | INTRAVENOUS | Status: AC
  Administered 2021-05-18: 4 mg via INTRAVENOUS
  Filled 2021-05-18: qty 1

## 2021-05-18 MED ORDER — ENOXAPARIN SODIUM 60 MG/0.6ML IJ SOSY
0.5000 mg/kg | PREFILLED_SYRINGE | INTRAMUSCULAR | Status: DC
  Administered 2021-05-18 – 2021-05-23 (×6): 57.5 mg via SUBCUTANEOUS
  Filled 2021-05-18 (×6): qty 0.6

## 2021-05-18 MED ORDER — MORPHINE SULFATE (PF) 2 MG/ML IV SOLN
2.0000 mg | INTRAVENOUS | Status: DC | PRN
  Administered 2021-05-18 (×2): 2 mg via INTRAVENOUS
  Filled 2021-05-18 (×2): qty 1

## 2021-05-18 MED ORDER — LEVETIRACETAM IN NACL 500 MG/100ML IV SOLN
500.0000 mg | Freq: Two times a day (BID) | INTRAVENOUS | Status: DC
  Administered 2021-05-18 – 2021-05-21 (×6): 500 mg via INTRAVENOUS
  Filled 2021-05-18 (×7): qty 100

## 2021-05-18 MED ORDER — DEXTROSE 50 % IV SOLN
12.5000 g | INTRAVENOUS | Status: AC
  Administered 2021-05-18: 12.5 g via INTRAVENOUS
  Filled 2021-05-18: qty 50

## 2021-05-18 MED ORDER — ONDANSETRON HCL 4 MG/2ML IJ SOLN
4.0000 mg | Freq: Four times a day (QID) | INTRAMUSCULAR | Status: DC | PRN
  Administered 2021-05-19 – 2021-05-23 (×3): 4 mg via INTRAVENOUS
  Filled 2021-05-18 (×4): qty 2

## 2021-05-18 MED ORDER — MOMETASONE FURO-FORMOTEROL FUM 200-5 MCG/ACT IN AERO
2.0000 | INHALATION_SPRAY | Freq: Two times a day (BID) | RESPIRATORY_TRACT | Status: DC
  Administered 2021-05-19 – 2021-05-24 (×9): 2 via RESPIRATORY_TRACT
  Filled 2021-05-18: qty 8.8

## 2021-05-18 MED ORDER — INSULIN ASPART 100 UNIT/ML IJ SOLN
0.0000 [IU] | INTRAMUSCULAR | Status: DC
  Administered 2021-05-18: 2 [IU] via SUBCUTANEOUS
  Administered 2021-05-18 – 2021-05-19 (×2): 3 [IU] via SUBCUTANEOUS
  Filled 2021-05-18 (×3): qty 1

## 2021-05-18 MED ORDER — MOMETASONE FURO-FORMOTEROL FUM 200-5 MCG/ACT IN AERO
2.0000 | INHALATION_SPRAY | Freq: Two times a day (BID) | RESPIRATORY_TRACT | Status: DC
  Filled 2021-05-18: qty 8.8

## 2021-05-18 MED ORDER — POTASSIUM CHLORIDE IN NACL 40-0.9 MEQ/L-% IV SOLN
INTRAVENOUS | Status: DC
  Filled 2021-05-18: qty 1000

## 2021-05-18 NOTE — ED Provider Notes (Signed)
? ?[facility] ?Provider Note ? ? ? Event Date/Time  ? First MD Initiated Contact with Patient 05/18/21 0840   ?  (approximate) ? ?History  ? ?Chief Complaint: Abdominal Pain ? ?HPI ? ?[name] is a 56 y.o. female with a past medical history of COPD, diabetes, bowel obstruction 3 weeks ago who presents to the emergency department for abdominal pain and bloating.  According to the patient 3 weeks ago she was admitted for a bowel obstruction treated with an NG tube no surgery.  Patient states she recovered had been doing well at home until the last 2 to 3 days where she has been experiencing abdominal discomfort and now abdominal bloating and nausea.  No vomiting.  Last bowel movement was 2 days ago which is atypical per patient.  No urinary symptoms.  Patient states the abdominal pain and bloating feel identical to her past bowel obstruction. ? ?Physical Exam  ? ?Triage Vital Signs: ?ED Triage Vitals  ?Enc Vitals Group  ?   BP 05/18/21 0837 (!) 125/102  ?   Pulse Rate 05/18/21 0837 (!) 124  ?   Resp 05/18/21 0837 18  ?   Temp 05/18/21 0837 98.7 ?F (37.1 ?C)  ?   Temp Source 05/18/21 0837 Oral  ?   SpO2 05/18/21 0837 100 %  ?   Weight 05/18/21 0835 255 lb 11.7 oz (116 kg)  ?   Height 05/18/21 0835 5' 6"  (1.676 m)  ?   Head Circumference --   ?   Peak Flow --   ?   Pain Score 05/18/21 0835 10  ?   Pain Loc --   ?   Pain Edu? --   ?   Excl. in [place]? --   ? ? ?Most recent vital signs: ?Vitals:  ? 05/18/21 0837  ?BP: (!) 125/102  ?Pulse: (!) 124  ?Resp: 18  ?Temp: 98.7 ?F (37.1 ?C)  ?SpO2: 100%  ? ? ?General: Awake, no distress.  ?CV:  Good peripheral perfusion.  Regular rate and rhythm  ?Resp:  Normal effort.  Equal breath sounds bilaterally.  ?Abd:  Soft, moderate distention, moderate diffuse tenderness without focal tenderness identified.  No rebound or guarding. ? ? ?ED Results / Procedures / Treatments  ? ?RADIOLOGY ? ?I personally reviewed the CT images patient does appear to have  distended stomach and small intestine with appears distended and fluid-filled. ?IMPRESSION:  ?1. Fluid-filled dilated stomach is associated with dilated  ?fluid-filled small bowel loops in the pelvis with decompressed  ?distal ileum. A discrete or abrupt small bowel transition zone  ?cannot be identified, but given the distal and terminal ileum is  ?completely decompressed, component of small-bowel obstruction cannot  ?be excluded. Despite the decompressed distal small bowel, the colon  ?is also diffusely fluid-filled and distended throughout from the  ?cecum to the rectum. Fluid in the rectum suggests diarrheal illness  ?and ileus would be a consideration.  ?2. Fluid-filled and distended appendix up to 8 mm diameter although  ?no periappendiceal edema or inflammation. This is likely related to  ?the colonic distension.  ?3. Bilateral nonobstructing renal stones.  ?4. Stable 2 cm right adrenal adenoma. No followup recommended.  ?   ? ? ?MEDICATIONS ORDERED IN ED: ?Medications  ?sodium chloride 0.9 % bolus 1,000 mL (has no administration in time range)  ?morphine (PF) 4 MG/ML injection 4 mg (has no administration in time range)  ?ondansetron (ZOFRAN) injection 4 mg (has no administration in time  range)  ? ? ?IMPRESSION / MDM / ASSESSMENT AND PLAN / ED COURSE  ?I reviewed the triage vital signs and the nursing notes. ? ?Patient presents emergency department for abdominal discomfort abdominal bloating nausea consistent with her past bowel obstructions.  Patient states she has had 2 prior bowel obstructions including one 3 weeks ago.  I reviewed the patient's last discharge summary from 05/01/2021 after she was admitted to the hospital for 9 days with a bowel obstruction that resolved with an NG tube.  We will check labs, obtain CT imaging to evaluate for possible bowel obstruction among other intra-abdominal pathology.  We will treat pain nausea and IV hydrate while awaiting CT results.  Patient agreeable to plan.   Patient states a codeine allergy but specifically states she has had morphine previously with no ill effect. ? ?Patient CT scan is concerning for ileus versus bowel obstruction.  We will place an NG tube and admit to the hospital service.  CBC and CMP showed no concerning findings.  Lipase negative. ? ?FINAL CLINICAL IMPRESSION(S) / ED DIAGNOSES  ? ?Abdominal pain ?Abdominal bloating ?Small bowel obstruction ? ?Note:  This document was prepared using Dragon voice recognition software and may include unintentional dictation errors. ?  ?Harvest Dark, MD ?05/18/21 1515 ? ?

## 2021-05-18 NOTE — Assessment & Plan Note (Signed)
Patient has a history of diabetes mellitus with stage IIIa chronic kidney disease. ?Hold insulin for now since patient is n.p.o. ?Check blood sugars every 4 hours ?

## 2021-05-18 NOTE — Assessment & Plan Note (Signed)
Stable ?Continue CPAP at bedtime ?

## 2021-05-18 NOTE — Assessment & Plan Note (Signed)
Patient presents to the ER for evaluation of severe periumbilical pain associated with nausea and abdominal distention. ?She has a prior history of ventral hernia repair ?Imaging shows fluid-filled dilated stomach associated with dilated ?fluid-filled small bowel loops in the pelvis with decompressed ?distal ileum. A discrete or abrupt small bowel transition zone ?cannot be identified, but given the distal and terminal ileum is ?completely decompressed, component of small-bowel obstruction cannot be excluded. ?Gastric decompression with NG tube ?Keep patient n.p.o. ?Supportive care with IV fluid hydration, IV PPI, antiemetics and pain medication ?We will consult surgery ?

## 2021-05-18 NOTE — Consult Note (Signed)
[facility] ?SURGICAL CONSULTATION NOTE (initial) - cpt: 16109 ? ? ?HISTORY OF PRESENT ILLNESS (HPI):  ?56 y.o. female presented to [facility] ED today for evaluation of abdominal pain. Patient reports this is her third hospitalization for bowel obstruction.  Most recently from April 4 to April 13.  She was seen by general surgery, her small bowel resolved prior to discharge.  She had prior incisional hernia repair 10 years ago.   ?CT scan imaging revealed  IMPRESSION: 1. Fluid-filled dilated stomach is associated with dilated fluid-filled small bowel loops in the pelvis with decompressed distal ileum. A discrete or abrupt small bowel transition zone cannot be identified, but given the distal and terminal ileum is completely decompressed, component of small-bowel obstruction cannot be excluded. Despite the decompressed distal small bowel, the colon is also diffusely fluid-filled and distended throughout from the cecum to the rectum. Fluid in the rectum suggests diarrheal illness and ileus would be a consideration. 2. Fluid-filled and distended appendix up to 8 mm diameter although no periappendiceal edema or inflammation. This is likely related to the colonic distension. ? ?She reports having a 3-day history watery diarrhea prior to admission, this appears to have improved since she got to her room.  She reports she does not pass gas.  Vague periumbilical abdominal pain.  Associated nausea, but no vomiting. ? ?Surgery is consulted by hospitalist physician Dr. Francine Graven in this context for evaluation and management of small bowel obstruction versus ileus. ? ?PAST MEDICAL HISTORY (PMH):  ?Past Medical History:  ?Diagnosis Date  ? Anxiety and depression 09/13/2006  ? Qualifier: Diagnosis of  By: Hassell Done [name], Tori Milks    ? Arthritis   ? joint pain   ? Asthma   ? COPD (chronic obstructive pulmonary disease) ([place])   ? chronic bronchitis   ? Depression   ? Diabetes mellitus   ? since age 61; type 2 IDDM  ? Diabetic  neuropathy, painful ([place])   ? FEET AND HANDS  ? Diabetic retinopathy ([place])   ? Diastolic dysfunction   ? a.  Echo 11/17: EF 60-65%, mild LVH, no RWMA, Gr1DD, mild AI, dilated aortic root measuring 38 mm, mildly dilated ascending aorta  ? Dilated aortic root ([place])   ? 81m by echo 11/2015  ? Dizziness   ? secondary to diabetes and hypertension   ? Epilepsy idiopathic petit mal ([place]   ? last seizure 2012;controlled w/ topomax  ? GERD (gastroesophageal reflux disease)   ? Glaucoma   ? NOT ON ANY EYE DROPS   ? Headache(784.0)   ? migraines   ? Heart murmur   ? born with   ? History of stress test   ? a. 11/17: Normal perfusion, EF 53%, normal study  ? Hx of blood clots   ? hematomas removed from left side of brain from 159moto 5y6yrld   ? Hyperlipidemia   ? Hypertension   ? Legally blind   ? left eye   ? MIGRAINE HEADACHE 09/14/2006  ? Qualifier: Diagnosis of  By: MarHassell DoneP, NykTori Milks ? [facility].  Patient reported history of without objective documentation  ? Nephrolithiasis   ? frequent urination , urination at nite  PT SEEN IN ER 09/22/11 FOR BACK AND RT SIDED PAIN--HAS KNOWN STONE RT URETER AND UA IN ER SHOWED UTI  ? Pain 09/23/11  ? LOWER BACK AND RIGHT SIDE--PT HAS RIGHT URETERAL STONE  ? Pneumonia   ? hx of 2009  ?  Pyelonephritis   ? Seizures ([place])   ? [facility] ap[facility]   [facility] ; mild  ? Stress incontinence   ? Stroke [facility])   ? last 2003  RESIDUAL LEFT LEG WEAKNESS--NO OTHER RESIDUAL PROBLEMS  ?  ? ?PAST SURGICAL HISTORY ([place]):  ?Past Surgical History:  ?Procedure Laterality Date  ? BRAIN HEMATOMA EVACUATION    ? five procedures total, first procedure when 75 months old, last at 56 years of age.  ? CYSTOSCOPY W/ RETROGRADES  09/24/2011  ? Procedure: CYSTOSCOPY WITH RETROGRADE PYELOGRAM;  Surgeon: Malka So, MD;  [facility];  Service: Urology;  Laterality: Right;  ? CYSTOSCOPY WITH URETEROSCOPY  09/24/2011  ? Procedure: CYSTOSCOPY WITH  URETEROSCOPY;  Surgeon: Malka So, MD;  [facility];  Service: Urology;  Laterality: Right;  Balloon dilation right ureter ?  ? CYSTOSCOPY/RETROGRADE/URETEROSCOPY  08/24/2011  ? Procedure: CYSTOSCOPY/RETROGRADE/URETEROSCOPY;  Surgeon: Molli Hazard, MD;  [facility];  Service: Urology;  Laterality: Right;  Cysto, Right retrograde Pyelogram, right stent placement.   ? INCISIONAL HERNIA REPAIR  05/05/2011  ? Procedure: LAPAROSCOPIC INCISIONAL HERNIA;  Surgeon: Rolm Bookbinder, MD;  Location: [place];  Service: General;  Laterality: N/A;  ? kidney stone removal    ? MASS EXCISION  05/05/2011  ? Procedure: EXCISION MASS;  Surgeon: Rolm Bookbinder, MD;  Location: [place];  Service: General;  Laterality: Right;  ? PCNL    ? RETINAL DETACHMENT SURGERY Left 1990  ? RIGHT/LEFT HEART CATH AND CORONARY ANGIOGRAPHY N/A 04/28/2019  ? Procedure: RIGHT/LEFT HEART CATH AND CORONARY ANGIOGRAPHY;  Surgeon: Yolonda Kida, MD;  Location: [place] [facility];  Service: Cardiovascular;  Laterality: N/A;  ? SHUNT REMOVAL    ? shunt inserted at age 71 removed at age 89   ? VAGINAL HYSTERECTOMY  1996  ?  ? ?MEDICATIONS:  ?Prior to Admission medications   ?Medication Sig Start Date End Date Taking? Authorizing Provider  ?albuterol (ACCUNEB) 1.25 MG/3ML nebulizer solution Inhale 1 ampule into the lungs every 6 (six) hours as needed for wheezing or shortness of breath.    [provider]  ?albuterol (PROAIR HFA) 108 (90 Base) MCG/ACT inhaler INHALE 2 PUFFS BY MOUTH EVERY 6 HOURS AS NEEDED FOR WHEEZING OR SHORTNESS OF BREATH 01/24/18   Hubbard Hartshorn, [name]  ?amitriptyline (ELAVIL) 25 MG tablet Take 2-3 tablets (50-75 mg total) by mouth at bedtime. 01/07/21 07/06/21  Gillis Santa, MD  ?amLODipine (NORVASC) 10 MG tablet Take 1 tablet by mouth daily. 12/26/20 12/26/21  [provider]  ?baclofen (LIORESAL) 10 MG tablet Take 1 tablet (10 mg total) by mouth 3 (three) times daily. 03/18/21   Mecum, Erin E, [name]   ?budesonide (PULMICORT) 0.5 MG/2ML nebulizer solution Inhale 0.5 mg into the lungs daily.    Ottie Glazier, MD  ?butalbital-acetaminophen-caffeine [facility]) [phone] MG tablet Take 1-2 tablets by mouth every 6 (six) hours as needed for headache. 03/10/21 03/10/22  Gillis Santa, MD  ?carvedilol (COREG) 25 MG tablet Take 25 mg by mouth 2 (two) times daily. 03/06/21   [provider]  ?cholecalciferol (VITAMIN D3) 25 MCG (1000 UNIT) tablet Take 1,000 Units by mouth daily.    [provider]  ?ciclopirox (PENLAC) 8 % solution Apply topically at bedtime. 02/01/21   [provider]  ?cloNIDine (CATAPRES) 0.1 MG tablet Take 1 tablet (0.1 mg total) by mouth 2 (two) times daily. 12/31/20 12/31/21  Vladimir Crofts, MD  ?clopidogrel (PLAVIX)  75 MG tablet Take 1 tablet (75 mg total) by mouth daily. 10/10/19   Towanda Malkin, MD  ?Continuous Blood Gluc Sensor (FREESTYLE LIBRE 2 SENSOR) MISC Change every 14 days 05/14/21   Elayne Snare, MD  ?cycloSPORINE (RESTASIS) 0.05 % ophthalmic emulsion Place 2 drops into both eyes 2 (two) times daily.    [provider]  ?Dulaglutide (TRULICITY) 4.5 YK/9.9IP SOPN Inject 4.5 mg as directed once a week. 10/22/20   Elayne Snare, MD  ?DULoxetine (CYMBALTA) 60 MG capsule Take 1 capsule (60 mg total) by mouth daily. 02/14/21   Bo Merino, [name]  ?EPINEPHrine 0.3 mg/0.3 mL IJ SOAJ injection Inject 0.3 mg into the muscle as needed for anaphylaxis. 03/29/19   [provider]  ?ezetimibe (ZETIA) 10 MG tablet Take 1 tablet (10 mg total) by mouth daily. 04/14/21   Bo Merino, [name]  ?FARXIGA 10 MG TABS tablet TAKE 1 TABLET BY MOUTH EVERY DAY 09/30/20   Elayne Snare, MD  ?fluticasone (FLONASE) 50 MCG/ACT nasal spray Place 2 sprays into both nostrils daily. 04/18/19   Towanda Malkin, MD  ?gabapentin (NEURONTIN) 600 MG tablet 600 mg qAM, 600 mg qPM, 1200 mg QHS 01/07/21   Gillis Santa, MD  ?hydrALAZINE (APRESOLINE) 50 MG tablet Take 1 tablet by  mouth 3 (three) times daily. 01/31/21   [provider]  ?Insulin Disposable Pump (OMNIPOD DASH PODS, GEN 4,) MISC Change pod once daily 01/28/21   Elayne Snare, MD  ?insulin lispro (HUMALOG KWIKPEN) 200 UNIT/ML Katharine Look

## 2021-05-18 NOTE — Assessment & Plan Note (Signed)
Hold oral antihypertensive medications ?IV hydralazine for systolic blood pressure greater than 153mHg ?

## 2021-05-18 NOTE — ED Notes (Signed)
Peri care performed. Pt experiencing diarrhea past few days, several times per day. Stool is brown.  ?

## 2021-05-18 NOTE — Assessment & Plan Note (Signed)
Stable ?Continue IV PPI ?

## 2021-05-18 NOTE — Assessment & Plan Note (Signed)
Stable ?Place patient on Keppra 500 mg IV every 12 since she is n.p.o. ?Seizure precautions ?

## 2021-05-18 NOTE — H&P (Signed)
?History and Physical  ? ? ?Patient: [name] TFT:[id number] DOB: [birth year] ?DOA: 05/18/2021 ?DOS: the patient was seen and examined on 05/18/2021 ?PCP: Delsa Grana, [name]  ?Patient coming from: Home ? ?Chief Complaint:  ?Chief Complaint  ?Patient presents with  ? Abdominal Pain  ? ?HPI: [name] is a 56 y.o. female with medical history significant for CVA with left-sided hemiparesis, post incisional hernia repair, morbid obesity (BMI 41.28), diabetes mellitus, COPD, chronic diastolic dysfunction CHF, seizures and sleep apnea who presents to the ER for evaluation of abdominal pain which started on the day of admission. ?Patient was discharged from the hospital about 2 weeks ago for similar presentation and states that she had done well post discharge until about 3 days ago when she started having loose watery stools and then severe periumbilical pain on the day of admission which she rates a 7 x 10 in intensity at its worst.  Pain is nonradiating and is associated with nausea but no vomiting.  She continues to have small amounts of loose watery stools but denies having any flatus. ?She denies having any chest pain, no shortness of breath, no fever, no chills, no cough, no dizziness, no lightheadedness, no leg swelling, no blurred vision or any focal deficit. ?Review of Systems: As mentioned in the history of present illness. All other systems reviewed and are negative. ?Past Medical History:  ?Diagnosis Date  ? Anxiety and depression 09/13/2006  ? Qualifier: Diagnosis of  By: Hassell Done [name], Tori Milks    ? Arthritis   ? joint pain   ? Asthma   ? COPD (chronic obstructive pulmonary disease) ([place])   ? chronic bronchitis   ? Depression   ? Diabetes mellitus   ? since age 51; type 2 IDDM  ? Diabetic neuropathy, painful ([place])   ? FEET AND HANDS  ? Diabetic retinopathy ([place])   ? Diastolic dysfunction   ? a.  Echo 11/17: EF 60-65%, mild LVH, no RWMA, Gr1DD, mild AI, dilated aortic root measuring 38 mm,  mildly dilated ascending aorta  ? Dilated aortic root ([place])   ? 49m by echo 11/2015  ? Dizziness   ? secondary to diabetes and hypertension   ? Epilepsy idiopathic petit mal ([place]   ? last seizure 2012;controlled w/ topomax  ? GERD (gastroesophageal reflux disease)   ? Glaucoma   ? NOT ON ANY EYE DROPS   ? Headache(784.0)   ? migraines   ? Heart murmur   ? born with   ? History of stress test   ? a. 11/17: Normal perfusion, EF 53%, normal study  ? Hx of blood clots   ? hematomas removed from left side of brain from 126moto 5y48yrld   ? Hyperlipidemia   ? Hypertension   ? Legally blind   ? left eye   ? MIGRAINE HEADACHE 09/14/2006  ? Qualifier: Diagnosis of  By: MarHassell DoneP, NykTori Milks ? [facility] ? a.  Patient reported history of without objective documentation  ? Nephrolithiasis   ? frequent urination , urination at nite  PT SEEN IN ER 09/22/11 FOR BACK AND RT SIDED PAIN--HAS KNOWN STONE RT URETER AND UA IN ER SHOWED UTI  ? Pain 09/23/11  ? LOWER BACK AND RIGHT SIDE--PT HAS RIGHT URETERAL STONE  ? Pneumonia   ? hx of 2009  ? Pyelonephritis   ? Seizures ([place] ? Sleep apnea   [facility] ; mild  ? Stress incontinence   ? Stroke [facility])   ? last 2003  RESIDUAL LEFT LEG WEAKNESS--NO OTHER RESIDUAL PROBLEMS  ? ?Past Surgical History:  ?Procedure Laterality Date  ? BRAIN HEMATOMA EVACUATION    ? five procedures total, first procedure when 52 months old, last at 56 years of age.  ? CYSTOSCOPY W/ RETROGRADES  09/24/2011  ? Procedure: CYSTOSCOPY WITH RETROGRADE PYELOGRAM;  Surgeon: Malka So, MD;  [facility];  Service: Urology;  Laterality: Right;  ? CYSTOSCOPY WITH URETEROSCOPY  09/24/2011  ? Procedure: CYSTOSCOPY WITH URETEROSCOPY;  Surgeon: Malka So, MD;  [facility];  Service: Urology;  Laterality: Right;  Balloon dilation right ureter ?  ? CYSTOSCOPY/RETROGRADE/URETEROSCOPY  08/24/2011  ? Procedure: CYSTOSCOPY/RETROGRADE/URETEROSCOPY;  Surgeon: Molli Hazard, MD;  [facility];  Service: Urology;  Laterality: Right;  Cysto, Right retrograde Pyelogram, right stent placement.   ? INCISIONAL HERNIA REPAIR  05/05/2011  ? Procedure: LAPAROSCOPIC INCISIONAL HERNIA;  Surgeon: Rolm Bookbinder, MD;  Location: [place];  Service: General;  Laterality: N/A;  ? kidney stone removal    ? MASS EXCISION  05/05/2011  ? Procedure: EXCISION MASS;  Surgeon: Rolm Bookbinder, MD;  Location: [place];  Service: General;  Laterality: Right;  ? PCNL    ? RETINAL DETACHMENT SURGERY Left 1990  ? RIGHT/LEFT HEART CATH AND CORONARY ANGIOGRAPHY N/A 04/28/2019  ? Procedure: RIGHT/LEFT HEART CATH AND CORONARY ANGIOGRAPHY;  Surgeon: Yolonda Kida, MD;  Location: [place] [facility];  Service: Cardiovascular;  Laterality: N/A;  ? SHUNT REMOVAL    ? shunt inserted at age 9 removed at age 53   ? VAGINAL HYSTERECTOMY  1996  ? ?Social History:  reports that she quit smoking about 34 years ago. Her smoking use included cigarettes. She has a 15.00 pack-year smoking history. She has never used smokeless tobacco. She reports current alcohol use of about 1.0 standard drink per week. She reports that she does not use drugs. ? ?Allergies  ?Allergen Reactions  ? Codeine Swelling and Other (See Comments)  ?  Swelling and burning of mouth (inside)  ? Lactose Intolerance ([place]) Nausea And Vomiting  ? ? ?Family History  ?Problem Relation Age of Onset  ? Uterine cancer Mother   ? Hypertension Mother   ? Cancer Mother   ? Brain cancer Maternal Grandmother   ? Hypertension Maternal Grandmother   ? Birth defects Daughter   ? Hypertension Daughter   ? Cirrhosis Maternal Grandfather   ? ADD / ADHD Maternal Grandfather   ? Birth defects Maternal Grandfather   ? Diabetes Maternal Grandfather   ? Anesthesia problems Neg Hx   ? Colon cancer Neg Hx   ? Esophageal cancer Neg Hx   ? Pancreatic cancer Neg Hx   ? ? ?Prior to Admission medications   ?Medication Sig Start Date End Date Taking? Authorizing Provider   ?albuterol (ACCUNEB) 1.25 MG/3ML nebulizer solution Inhale 1 ampule into the lungs every 6 (six) hours as needed for wheezing or shortness of breath.    [provider]  ?albuterol (PROAIR HFA) 108 (90 Base) MCG/ACT inhaler INHALE 2 PUFFS BY MOUTH EVERY 6 HOURS AS NEEDED FOR WHEEZING OR SHORTNESS OF BREATH 01/24/18   Hubbard Hartshorn, [name]  ?amitriptyline (ELAVIL) 25 MG tablet Take 2-3 tablets (50-75 mg total) by mouth at bedtime. 01/07/21 07/06/21  Gillis Santa, MD  ?amLODipine (NORVASC) 10 MG tablet Take 1 tablet by mouth daily. 12/26/20 12/26/21  [provider]  ?baclofen (LIORESAL) 10 MG tablet Take 1 tablet (10 mg total) by mouth 3 (three) times daily. 03/18/21   Mecum, Erin E, [name]  ?budesonide (PULMICORT) 0.5 MG/2ML nebulizer solution Inhale 0.5 mg into the lungs daily.    Ottie Glazier, MD  ?butalbital-acetaminophen-caffeine [facility]) [phone] MG tablet Take 1-2 tablets by mouth every 6 (six) hours as needed for headache. 03/10/21 03/10/22  Gillis Santa, MD  ?carvedilol (COREG) 25 MG tablet Take 25 mg by mouth 2 (two) times daily. 03/06/21   [provider]  ?cholecalciferol (VITAMIN D3) 25 MCG (1000 UNIT) tablet Take 1,000 Units by mouth daily.    [provider]  ?ciclopirox (PENLAC) 8 % solution Apply topically at bedtime. 02/01/21   [provider]  ?cloNIDine (CATAPRES) 0.1 MG tablet Take 1 tablet (0.1 mg total) by mouth 2 (two) times daily. 12/31/20 12/31/21  Vladimir Crofts, MD  ?clopidogrel (PLAVIX) 75 MG tablet Take 1 tablet (75 mg total) by mouth daily. 10/10/19   Towanda Malkin, MD  ?Continuous Blood Gluc Sensor (FREESTYLE LIBRE 2 SENSOR) MISC Change every 14 days 05/14/21   Elayne Snare, MD  ?cycloSPORINE (RESTASIS) 0.05 % ophthalmic emulsion Place 2 drops into both eyes 2 (two) times daily.    [provider]  ?Dulaglutide (TRULICITY) 4.5 EB/5.8XE SOPN Inject 4.5 mg as directed once a week. 10/22/20   Elayne Snare, MD  ?DULoxetine (CYMBALTA) 60  MG capsule Take 1 capsule (60 mg total) by mouth daily. 02/14/21   Bo Merino, [name]  ?EPINEPHrine 0.3 mg/0.3 mL IJ SOAJ injection Inject 0.3 mg into the muscle as needed for anaphylaxis. 03/29/19   Provider

## 2021-05-18 NOTE — Assessment & Plan Note (Signed)
>>  ASSESSMENT AND PLAN FOR SEIZURE (HCC) WRITTEN ON 05/18/2021 12:19 PM BY AGBATA, TOCHUKWU, MD  Stable Place patient on Keppra  500 mg IV every 12 since she is n.p.o. Seizure precautions

## 2021-05-18 NOTE — Progress Notes (Addendum)
1309 ?Pt removed insulin pump. BS 95 ? ?1554 ?Pt BS 60 and NPO. Hospitalist aware. Hypoglycemic protocol placed ?

## 2021-05-18 NOTE — ED Notes (Signed)
Pt to ED for pressure-like abdominal pain LUQ that  wraps around to back and lower abdomen. Has insulin pump to R abdomen. Has gallbladder still. Denies vomiting, does feel nauseous. ?

## 2021-05-18 NOTE — Assessment & Plan Note (Signed)
Stable and not acutely exacerbated ?Hold oral medications for now while patient is n.p.o. ?

## 2021-05-18 NOTE — ED Triage Notes (Signed)
Pt reports abd pain, constipation and nausea. Pt reports was here same sx's recently and was admitted for a bowel obstruction. Pt tearful in triage, states last BM was Friday ?

## 2021-05-18 NOTE — Assessment & Plan Note (Signed)
PMI 41.28 kg/m2 ?Complicates overall prognosis and care ?Lifestyle modification and exercise has been discussed with patient in detail ?

## 2021-05-19 ENCOUNTER — Inpatient Hospital Stay: Payer: Medicare Other

## 2021-05-19 DIAGNOSIS — I10 Essential (primary) hypertension: Secondary | ICD-10-CM

## 2021-05-19 DIAGNOSIS — K219 Gastro-esophageal reflux disease without esophagitis: Secondary | ICD-10-CM

## 2021-05-19 DIAGNOSIS — I5032 Chronic diastolic (congestive) heart failure: Secondary | ICD-10-CM

## 2021-05-19 DIAGNOSIS — G4733 Obstructive sleep apnea (adult) (pediatric): Secondary | ICD-10-CM

## 2021-05-19 DIAGNOSIS — K567 Ileus, unspecified: Secondary | ICD-10-CM | POA: Diagnosis not present

## 2021-05-19 DIAGNOSIS — K56609 Unspecified intestinal obstruction, unspecified as to partial versus complete obstruction: Secondary | ICD-10-CM | POA: Diagnosis not present

## 2021-05-19 LAB — BASIC METABOLIC PANEL
Anion gap: 7 (ref 5–15)
BUN: 13 mg/dL (ref 6–20)
CO2: 27 mmol/L (ref 22–32)
Calcium: 8 mg/dL — ABNORMAL LOW (ref 8.9–10.3)
Chloride: 104 mmol/L (ref 98–111)
Creatinine, Ser: 1.13 mg/dL — ABNORMAL HIGH (ref 0.44–1.00)
GFR, Estimated: 57 mL/min — ABNORMAL LOW (ref >60)
Glucose, Bld: 205 mg/dL — ABNORMAL HIGH (ref 70–99)
Potassium: 3.8 mmol/L (ref 3.5–5.1)
Sodium: 138 mmol/L (ref 135–145)

## 2021-05-19 LAB — CBC
HCT: 37.1 % (ref 36.0–46.0)
Hemoglobin: 11.4 g/dL — ABNORMAL LOW (ref 12.0–15.0)
MCH: 28.5 pg (ref 26.0–34.0)
MCHC: 30.7 g/dL (ref 30.0–36.0)
MCV: 92.8 fL (ref 80.0–100.0)
Platelets: 227 10*3/uL (ref 150–400)
RBC: 4 MIL/uL (ref 3.87–5.11)
RDW: 14.1 % (ref 11.5–15.5)
WBC: 7 10*3/uL (ref 4.0–10.5)
nRBC: 0 % (ref 0.0–0.2)

## 2021-05-19 LAB — GLUCOSE, CAPILLARY
Glucose-Capillary: 195 mg/dL — ABNORMAL HIGH (ref 70–99)
Glucose-Capillary: 189 mg/dL — ABNORMAL HIGH (ref 70–99)
Glucose-Capillary: 134 mg/dL — ABNORMAL HIGH (ref 70–99)
Glucose-Capillary: 111 mg/dL — ABNORMAL HIGH (ref 70–99)

## 2021-05-19 MED ORDER — HYDROMORPHONE HCL 1 MG/ML IJ SOLN
0.5000 mg | INTRAMUSCULAR | Status: DC | PRN
  Administered 2021-05-19 – 2021-05-21 (×5): 0.5 mg via INTRAVENOUS
  Filled 2021-05-19 (×6): qty 1

## 2021-05-19 MED ORDER — SODIUM CHLORIDE 0.9 % IV SOLN: INTRAVENOUS | Status: AC

## 2021-05-19 MED ORDER — INSULIN ASPART 100 UNIT/ML IJ SOLN
0.0000 [IU] | Freq: Three times a day (TID) | INTRAMUSCULAR | Status: DC
  Administered 2021-05-19 – 2021-05-21 (×5): 2 [IU] via SUBCUTANEOUS
  Administered 2021-05-22 – 2021-05-23 (×6): 3 [IU] via SUBCUTANEOUS
  Administered 2021-05-24: 5 [IU] via SUBCUTANEOUS
  Administered 2021-05-24: 3 [IU] via SUBCUTANEOUS
  Filled 2021-05-19 (×14): qty 1

## 2021-05-19 MED ORDER — METOPROLOL TARTRATE 5 MG/5ML IV SOLN
5.0000 mg | Freq: Three times a day (TID) | INTRAVENOUS | Status: DC
  Administered 2021-05-19 – 2021-05-21 (×6): 5 mg via INTRAVENOUS
  Filled 2021-05-19 (×6): qty 5

## 2021-05-19 NOTE — Plan of Care (Signed)

## 2021-05-19 NOTE — Progress Notes (Signed)
Inpatient Diabetes Program Recommendations ? ?AACE/ADA: New Consensus Statement on Inpatient Glycemic Control (2015) ? ?Target Ranges:  Prepandial:   less than 140 mg/dL ?     Peak postprandial:   less than 180 mg/dL (1-2 hours) ?     Critically ill patients:  140 - 180 mg/dL  ? ?Lab Results  ?Component Value Date  ? GLUCAP 189 (H) 05/19/2021  ? HGBA1C 7.5 (H) 04/26/2021  ? ? ?Review of Glycemic Control ? Latest Reference Range & Units 05/18/21 23:27 05/19/21 03:31 05/19/21 07:55  ?Glucose-Capillary 70 - 99 mg/dL 175 (H) 195 (H) 189 (H)  ?(H): Data is abnormally high ?Diabetes history: Type 2 DM ?Outpatient Diabetes medications: Trulicity 4.5 mg qwk, Farxiga 10 mg QD, Humalog per Omnipod ?Current orders for Inpatient glycemic control: Novolog 0-15 units TID ? ?Inpatient Diabetes Program Recommendations:   ? ?Per RN notes patient removed Omnipod. Patient returns from [place].  ?Sees Dr Dwyane Dee with outpatient endocrinology. Anticipate with insulin pump removal patient's glucose trends will increase.  ? ?At this time, consider: ?- Adding Levemir 15 units BID until patient able to reapply insulin pump. ? ? ?Outpatient insulin pump settings are as follows: ? ?Basal insulin  ?0000-1400 5.05 units/hour ?1401-0000 5.60 units/hour ?Total daily basal insulin: 126.7 units/24 hours ? ?Carb Coverage ?1:1 1 unit for every 1 grams of carbohydrates ? ?Insulin Sensitivity ?1:50 1 unit drops blood glucose 50 mg/dl ? ?Target Glucose Goals ?0000-0000 140 mg/dl ? ?Thanks, ?Bronson Curb, MSN, RNC-OB ?Diabetes Coordinator ?[phone] (8a-5p) ? ? ? ? ? ?

## 2021-05-19 NOTE — TOC Progression Note (Signed)
Transition of Care (TOC) - Progression Note  ? ? ?Patient Details  ?Name: [name] ?MRN: [id number] ?Date of Birth: [birth year] ? ?Transition of Care (TOC) CM/SW Contact  ?Conception Oms, RN ?Phone Number: ?05/19/2021, 10:44 AM ? ?Clinical Narrative:    ? ?Patient is followed by [facility], She is followed by chronic case Mgt from [facility] ?TOC to monitor for additional needs ? ?Expected Discharge Plan: [place] ?Barriers to Discharge: Continued Medical Work up ? ?Expected Discharge Plan and Services ?Expected Discharge Plan: [place] ?  ?  ?  ?  ?                ?  ?  ?  ?  ?  ?  ?  ?  ?  ?  ? ? ?Social Determinants of Health (SDOH) Interventions ?  ? ?Readmission Risk Interventions ? ?  04/24/2021  ?  3:02 PM  ?Readmission Risk Prevention Plan  ?Transportation Screening Complete  ?[place] or Home Care Consult Complete  ?Palliative Care Screening Not Applicable  ?Medication Review Press photographer) Complete  ? ? ?

## 2021-05-19 NOTE — Progress Notes (Addendum)
PROGRESS NOTE  [name]    DOB: March 21, 1965, 56 y.o.  JXB:[id number]    Code Status: Full Code   DOA: 05/18/2021   LOS: 1   Brief hospital course  [name] is a 56 y.o. female with a PMH significant for CVA with left-sided hemiparesis, post incisional hernia repair, morbid obesity (BMI 41.28), diabetes mellitus, COPD, chronic diastolic dysfunction CHF, seizures and sleep apnea .  They presented from home to the ED on 05/18/2021 with severe abdominal pain x 1 day and watery Bms x3 days. She was recently discharged form hospital for similar.  In the ED, it was found that they had stable vital signs with exception of tachycardia up to 124.  Significant findings included Na+ 138, K+ 3.5, Cl 96, bicarb 30, glucose 169, BUN 18, Cr 1.3, Ca++ 10, WBC 8.8, hgb 14, platelets 325. CT abdomen/pelvis-  fluid-filled dilated stomach is associated with dilated fluid-filled small bowel loops in the pelvis with decompressed distal ileum. A discrete or abrupt small bowel transition zone cannot be identified, but given the distal and terminal ileum is completely decompressed, component of small-bowel obstruction cannot be excluded. Despite the decompressed distal small bowel, the colon is also diffusely fluid-filled and distended throughout from the cecum to the rectum. Fluid in the rectum suggests diarrheal illness and ileus would be a consideration. Fluid-filled and distended appendix up to 8 mm diameter although no periappendiceal edema or inflammation. This is likely related to the colonic distension Bilateral nonobstructing renal stones. Stable 2 cm right adrenal adenoma. No followup recommended  They were treated with IV fluids, NPO, analgesia, antiemetics, PPI, and home medications. General surgery was consulted.  Patient was admitted to medicine service for further workup and management of abdominal pain as outlined in detail below.  05/19/21 -stable  Assessment & Plan  Principal  Problem:   Small bowel obstruction (HCC) Active Problems:   Seizure (HCC)   OSA (obstructive sleep apnea)   GERD without esophagitis   Essential hypertension, benign   Chronic diastolic CHF (congestive heart failure) (HCC)   Type 2 diabetes mellitus with chronic kidney disease, with long-term current use of insulin (HCC)   Obesity, Class III, BMI 40-49.9 (morbid obesity) (HCC)  Abdominal pain- ileus vs SBO. H/o remote abdominal hernia repair and prior SBOs. - General surgery following, appreciate recs  - NG tube  - serial KUBs - continue IV hydration, NPO - CBC am   Seizure disorder (HCC)- Stable - patient on Keppra 500 mg IV every 12 since she is n.p.o. - Seizure precautions   OSA- Stable - Continue CPAP at bedtime   GERD without esophagitis- Stable - Continue IV PPI   Essential hypertension Hold oral antihypertensive medications - IV metoprolol to avoid reflex tachycardia   Chronic diastolic CHF (congestive heart failure) (HCC) Stable and not acutely exacerbated Hold oral medications for now while patient is n.p.o.   Type 2 diabetes mellitus with chronic kidney disease, with long-term current use of insulin (HCC) Patient has a history of diabetes mellitus with stage IIIa chronic kidney disease. Hold insulin for now since patient is n.p.o. Check blood sugars with meals   Obesity, Class III, BMI 40-49.9 (morbid obesity) (HCC) PMI 41.28 kg/m2 Complicates overall prognosis and care Lifestyle modification and exercise has been discussed with patient in detail  Body mass index is 41.28 kg/m.  VTE ppx: lovenox  Diet:     Diet   Diet NPO time specified   Consultants: General surgery Subjective 05/19/21  Pt reports still feeling bad. Endorses abdominal tenderness and anorexia. She states that she had a watery BM overnight.    Objective   Vitals:   05/18/21 1559 05/18/21 2103 05/19/21 0432 05/19/21 0752  BP: 123/70 126/69 112/67 125/75  Pulse: (!) 107 (!)  104 99 (!) 105  Resp: 16 18 16 18   Temp: 98.4 F (36.9 C) 98.6 F (37 C) 97.6 F (36.4 C) 98.7 F (37.1 C)  TempSrc:  Oral  Oral  SpO2: 99% 94% 93% 98%  Weight:      Height:        Intake/Output Summary (Last 24 hours) at 05/19/2021 0805 Last data filed at 05/19/2021 0500 Gross per 24 hour  Intake 754.95 ml  Output 800 ml  Net -45.05 ml   Filed Weights   05/18/21 0835  Weight: 116 kg     Physical Exam:  General: awake, alert, apparent distress Respiratory: normal respiratory effort. Cardiovascular: quick capillary refill Gastrointestinal: soft, diffusely tender. No guarding or rebound Nervous: A&O x3. no gross focal neurologic deficits, normal speech Extremities: moves all equally, trace edema, normal tone Skin: dry, intact, normal temperature, normal color. No rashes, lesions or ulcers on exposed skin Psychiatry: anxious mood, congruent affect  Labs   I have personally reviewed the following labs and imaging studies CBC    Component Value Date/Time   WBC 7.0 05/19/2021 0444   RBC 4.00 05/19/2021 0444   HGB 11.4 (L) 05/19/2021 0444   HGB 13.1 05/08/2015 1106   HCT 37.1 05/19/2021 0444   HCT 39.5 05/08/2015 1106   PLT 227 05/19/2021 0444   PLT 274 05/08/2015 1106   MCV 92.8 05/19/2021 0444   MCV 91 05/08/2015 1106   MCH 28.5 05/19/2021 0444   MCHC 30.7 05/19/2021 0444   RDW 14.1 05/19/2021 0444   RDW 14.1 05/08/2015 1106   LYMPHSABS 1.9 08/26/2020 1139   LYMPHSABS 1.7 05/08/2015 1106   MONOABS 0.6 08/26/2020 1139   EOSABS 0.0 08/26/2020 1139   EOSABS 0.0 05/08/2015 1106   BASOSABS 0.0 08/26/2020 1139   BASOSABS 0.0 05/08/2015 1106      Latest Ref Rng & Units 05/19/2021    4:44 AM 05/18/2021    9:02 AM 05/13/2021    2:08 PM  BMP  Glucose 70 - 99 mg/dL 161   096   045    BUN 6 - 20 mg/dL 13   18   21     Creatinine 0.44 - 1.00 mg/dL 4.09   8.11   9.14    BUN/Creat Ratio 6 - 22 (calc)   17    Sodium 135 - 145 mmol/L 138   138   142    Potassium 3.5 - 5.1  mmol/L 3.8   3.5   4.0    Chloride 98 - 111 mmol/L 104   96   100    CO2 22 - 32 mmol/L 27   30   33    Calcium 8.9 - 10.3 mg/dL 8.0   78.2   9.6      DG Abdomen 1 View  Result Date: 05/18/2021 CLINICAL DATA:  NG tube placement EXAM: ABDOMEN - 1 VIEW COMPARISON:  Same day CT FINDINGS: Limited radiograph of the lower chest and upper abdomen was obtained for the purposes of enteric tube localization. Enteric tube is seen coursing below the diaphragm with distal tip and side port terminating within the expected location of the gastric body. Air-filled loops of large and small bowel noted within  the included upper abdomen. IMPRESSION: Enteric tube within the gastric body. Electronically Signed   By: Duanne Guess D.O.   On: 05/18/2021 12:34   CT ABDOMEN PELVIS W CONTRAST  Result Date: 05/18/2021 CLINICAL DATA:  Abdominal pain with constipation and nausea. Concern for bowel obstruction. EXAM: CT ABDOMEN AND PELVIS WITH CONTRAST TECHNIQUE: Multidetector CT imaging of the abdomen and pelvis was performed using the standard protocol following bolus administration of intravenous contrast. RADIATION DOSE REDUCTION: This exam was performed according to the departmental dose-optimization program which includes automated exposure control, adjustment of the mA and/or kV according to patient size and/or use of iterative reconstruction technique. CONTRAST:  OMNIPAQUE IOHEXOL 300 MG/ML  SOLN COMPARISON:  04/22/2021 FINDINGS: Lower chest: Unremarkable. Hepatobiliary: No suspicious focal abnormality within the liver parenchyma. There is no evidence for gallstones, gallbladder wall thickening, or pericholecystic fluid. No intrahepatic or extrahepatic biliary dilation. Pancreas: No focal mass lesion. No dilatation of the main duct. No intraparenchymal cyst. No peripancreatic edema. Spleen: No splenomegaly. No focal mass lesion. Adrenals/Urinary Tract: 2 cm right adrenal nodule stable since CT urogram 12/23/2015,  consistent with benign adrenal adenoma. No followup recommended. Left adrenal gland unremarkable. 4 nonobstructing stones are seen in the right kidney measuring up to 5 mm in the lower pole. Punctate nonobstructing stone identified upper pole left kidney. No evidence for hydroureter. The urinary bladder appears normal for the degree of distention. Stomach/Bowel: Stomach is distended with food and fluid. No obstructing mass lesion evident with fluid visible in the nondilated duodenum. Jejunal loops are nondilated small bowel loops in the pelvis are fluid-filled and distended up to 4 cm diameter. While a discrete or abrupt transition zone cannot be identified, the distal and terminal ileum is completely decompressed despite the decompressed distal small bowel, right colon is fluid-filled and mildly distended. Appendix is fluid-filled and distended up to 8 mm diameter although no periappendiceal edema or inflammation. Gas and fluid are seen along the length of the colon with some narrowing the sigmoid segment but gas and stool are seen distal to the sigmoid colon in the rectum down to the level of the anal verge. Vascular/Lymphatic: No abdominal aortic aneurysm. No abdominal aortic atherosclerotic calcification. There is no gastrohepatic or hepatoduodenal ligament lymphadenopathy. No retroperitoneal or mesenteric lymphadenopathy. No pelvic sidewall lymphadenopathy. Reproductive: The uterus is surgically absent. There is no adnexal mass. Other: No intraperitoneal free fluid. Musculoskeletal: No worrisome lytic or sclerotic osseous abnormality. IMPRESSION: 1. Fluid-filled dilated stomach is associated with dilated fluid-filled small bowel loops in the pelvis with decompressed distal ileum. A discrete or abrupt small bowel transition zone cannot be identified, but given the distal and terminal ileum is completely decompressed, component of small-bowel obstruction cannot be excluded. Despite the decompressed distal small  bowel, the colon is also diffusely fluid-filled and distended throughout from the cecum to the rectum. Fluid in the rectum suggests diarrheal illness and ileus would be a consideration. 2. Fluid-filled and distended appendix up to 8 mm diameter although no periappendiceal edema or inflammation. This is likely related to the colonic distension. 3. Bilateral nonobstructing renal stones. 4. Stable 2 cm right adrenal adenoma. No followup recommended. Electronically Signed   By: Kennith Center M.D.   On: 05/18/2021 11:10    Disposition Plan & Communication  Patient status: Inpatient  Admitted From: Home Planned disposition location: Home Anticipated discharge date: 5/3 pending improvement  Family Communication: none     Author: Leeroy Bock, DO [facility] 05/19/2021, 8:05 AM  Available by Epic secure chat 7AM-7PM. If 7PM-7AM, please contact night-coverage.  [facility] contact information found on [email or web address].

## 2021-05-19 NOTE — TOC Initial Note (Addendum)
Transition of Care (TOC) - Initial/Assessment Note  ? ? ?Patient Details  ?Name: [name] ?MRN: [id number] ?Date of Birth: Apr 25, 1965 ? ?Transition of Care (TOC) CM/SW Contact:    ?Candie Chroman, LCSW ?Phone Number: ?05/19/2021, 12:08 PM ? ?Clinical Narrative:  Readmission prevention screen complete. CSW called patient in the room, introduced role, and explained that discharge planning would be discussed. PCP is Delsa Grana, [name] at [facility]. She uses Medical Transportation or her daughter drives her to appointments. [facility] [address]. No issues obtaining medications. She is active with [facility] for PT and OT. Patient has a quad cane, SPC, rollator, BSC, and shower chair at home. No further concerns. CSW encouraged patient to contact CSW as needed. CSW will continue to follow patient for support and facilitate return home when stable. Her daughter will transport her home when discharged.              ? ?Expected Discharge Plan: [place] ?Barriers to Discharge: Continued Medical Work up ? ? ?Patient Goals and CMS Choice ?  ?  ?Choice offered to / list presented to : NA ? ?Expected Discharge Plan and Services ?Expected Discharge Plan: [place] ?  ?  ?Post Acute Care Choice: Resumption of Svcs/PTA Provider ?Living arrangements for the past 2 months: [place] ?                ?  ?  ?  ?  ?  ?HH Arranged: PT, OT ?[place] Agency: [facility] ([place]) ?Date HH Agency Contacted: 05/19/21 ?  ?Representative spoke with at [place]: Floydene Flock ? ?Prior Living Arrangements/Services ?Living arrangements for the past 2 months: [place] ?Lives with:: Adult Children ?Patient language and need for interpreter reviewed:: Yes ?Do you feel safe going back to the place where you live?: Yes      ?Need for Family Participation in Patient Care: Yes (Comment) ?Care giver support system in place?: Yes (comment) ?Current  home services: DME, Home OT, Home PT, Other (comment) (Social work) ?Criminal Activity/Legal Involvement Pertinent to Current Situation/Hospitalization: No - Comment as needed ? ?Activities of Daily Living ?Home Assistive Devices/Equipment: Cane (specify quad or straight) ?ADL Screening (condition at time of admission) ?Patient's cognitive ability adequate to safely complete daily activities?: Yes ?Is the patient deaf or have difficulty hearing?: No ?Does the patient have difficulty seeing, even when wearing glasses/contacts?: Yes ?Does the patient have difficulty concentrating, remembering, or making decisions?: No ?Patient able to express need for assistance with ADLs?: Yes ?Does the patient have difficulty dressing or bathing?: No ?Independently performs ADLs?: Yes (appropriate for developmental age) ?Does the patient have difficulty walking or climbing stairs?: Yes ?Weakness of Legs: Both ?Weakness of Arms/Hands: None ? ?Permission Sought/Granted ?  ?  ?   ?   ?   ?   ? ?Emotional Assessment ?Appearance:: Appears stated age ?Attitude/Demeanor/Rapport: Engaged, Gracious ?Affect (typically observed): Accepting, Appropriate, Calm, Pleasant ?Orientation: : Oriented to Self, Oriented to Place, Oriented to  Time, Oriented to Situation ?Alcohol / Substance Use: Not Applicable ?Psych Involvement: No (comment) ? ?Admission diagnosis:  Small bowel obstruction ([place]) [K56.609] ?Patient Active Problem List  ? Diagnosis Date Noted  ? Small bowel obstruction ([place]) 05/18/2021  ? Orthostatic hypotension 04/29/2021  ? Constipation 04/28/2021  ? SBO (small bowel obstruction) ([place]) 04/23/2021  ? Hypertensive urgency 04/23/2021  ? GERD without esophagitis 04/23/2021  ? AKI (acute kidney  injury) ([place]) 04/23/2021  ? Type 2 diabetes mellitus with diabetic neuropathy ([place]) 04/23/2021  ? Dyslipidemia 04/23/2021  ? Chronic diastolic CHF (congestive heart failure) ([place]) 04/23/2021  ? Cervical spondylosis 02/12/2021  ? Lumbar facet  arthropathy 02/12/2021  ? Recurrent UTI 11/13/2020  ? Angina pectoris ([place]) 11/13/2020  ? Major depressive disorder, recurrent episode, moderate ([place]) 11/13/2020  ? Weakness of right arm   ? Adrenal adenoma, right   ? PAD (peripheral artery disease) ([place]) 01/24/2020  ? Abnormal ankle brachial index (ABI) 12/18/2019  ? CAD (coronary artery disease) 04/27/2019  ? Myalgia 04/19/2019  ? Diabetic polyneuropathy associated with type 2 diabetes mellitus ([place]) 03/02/2018  ? Vitamin D deficiency 01/24/2018  ? Overactive bladder 05/18/2017  ? Dilated aortic root ([place])   ? Sacroiliac dysfunction 10/09/2014  ? Chronic low back pain 09/03/2014  ? CVA, old, hemiparesis ([place]) 09/03/2014  ? OSA (obstructive sleep apnea) 05/15/2014  ? Type 2 diabetes mellitus with chronic kidney disease, with long-term current use of insulin ([place]) 01/21/2014  ? Essential hypertension, benign 01/21/2014  ? Chronic pain syndrome 01/21/2014  ? Headache disorder 09/07/2013  ? Mixed urge and stress incontinence 08/25/2011  ? Ureteral stricture, right 08/25/2011  ? Pyelonephritis 08/22/2011  ? Nephrolithiasis 08/22/2011  ? Incisional hernia 05/10/2011  ? Obesity, Class III, BMI 40-49.9 (morbid obesity) ([place]) 05/10/2011  ? BLINDNESS, [place], [place] DEFINITION 09/14/2006  ? Hyperlipemia 09/13/2006  ? Accelerated hypertension 09/13/2006  ? GERD 09/13/2006  ? Seizure ([place]) 09/13/2006  ? ?PCP:  Delsa Grana, [name] ?Pharmacy:   ?[id number]?Phone: [phone]: [phone]? ?[facility] - [place], [place] - [address] ?[address] ?[place] [address]?Phone: [phone]: [phone]? ? ? ? ?Social Determinants of Health (SDOH) Interventions ?  ? ?Readmission Risk Interventions ? ?  05/19/2021  ? 12:02 PM 04/24/2021  ?  3:02 PM  ?Readmission Risk Prevention Plan  ?Transportation Screening Complete Complete  ?PCP or Specialist Appt within 5-7 Days Complete   ?Home Care  Screening Complete   ?Medication Review (RN CM) Complete   ?[facility] Consult  Complete  ?Palliative Care Screening  Not Applicable  ?Medication Review (Press photographer  Complete  ? ? ? ?

## 2021-05-19 NOTE — Progress Notes (Signed)
[facility] ?SURGICAL PROGRESS NOTE (cpt [phone]) ? ?Hospital Day(s): 1.  ? ?Interval History: Patient seen and examined, no acute events or new complaints overnight. Patient reports she continues to have abdominal distension and relatively generalized abdominal soreness. She denies fever, chills, nausea, emesis. CBC is reassuring. Renal function seems to be at or near her baseline; sCr - 1.13. No significant electrolyte derangements. She has had numerous loose bowel movements since admission. NGT is in place; no output documented; <50 ccs in canister.  ? ?Review of Systems:  ?Constitutional: denies fever, chills  ?HEENT: denies cough or congestion  ?Respiratory: denies any shortness of breath  ?Cardiovascular: denies chest pain or palpitations  ?Gastrointestinal: + abdominal pain, + diarrhea, denied N/V ?Genitourinary: denies burning with urination or urinary frequency ?Musculoskeletal: denies pain, decreased motor or sensation ? ?Vital signs in last 24 hours: [min-max] current  ?Temp:  [97.6 ?F (36.4 ?C)-99.2 ?F (37.3 ?C)] 97.6 ?F (36.4 ?C) (05/01 8127) ?Pulse Rate:  [99-124] 99 (05/01 0432) ?Resp:  [16-18] 16 (05/01 0432) ?BP: (112-126)/(67-102) 112/67 (05/01 0432) ?SpO2:  [93 %-100 %] 93 % (05/01 0432) ?Weight:  [517 kg] 116 kg (04/30 0835)     Height: 5' 6"  (167.6 cm) Weight: 116 kg BMI (Calculated): 41.3  ? ?Intake/Output last 2 shifts:  ?04/30 0701 - 05/01 0700 ?In: 001 [I.V.:555; IV Piggyback:200] ?Out: 800 [Urine:800]  ? ?Physical Exam:  ?Constitutional: alert, cooperative and no distress  ?HENT: normocephalic without obvious abnormality; NGT in place; minimal output ?Eyes: PERRL, EOM's grossly intact and symmetric  ?Respiratory: breathing non-labored at rest  ?Cardiovascular: regular rate and sinus rhythm  ?Gastrointestinal: Abdomen is obese, soft, she remains distended and tympanic, generalized soreness, no rebound/guarding. She is certainly not peritonitic ?Musculoskeletal: no edema or  wounds, motor and sensation grossly intact, NT  ? ? ?Labs:  ? ?  Latest Ref Rng & Units 05/19/2021  ?  4:44 AM 05/18/2021  ?  9:02 AM 05/01/2021  ?  6:21 AM  ?CBC  ?WBC 4.0 - 10.5 K/uL 7.0   8.8   7.2    ?Hemoglobin 12.0 - 15.0 g/dL 11.4   14.0   11.8    ?Hematocrit 36.0 - 46.0 % 37.1   45.3   37.8    ?Platelets 150 - 400 K/uL 227   325   273    ? ? ?  Latest Ref Rng & Units 05/19/2021  ?  4:44 AM 05/18/2021  ?  9:02 AM 05/13/2021  ?  2:08 PM  ?CMP  ?Glucose 70 - 99 mg/dL 205   169   115    ?BUN 6 - 20 mg/dL 13   18   21     ?Creatinine 0.44 - 1.00 mg/dL 1.13   1.33   1.26    ?Sodium 135 - 145 mmol/L 138   138   142    ?Potassium 3.5 - 5.1 mmol/L 3.8   3.5   4.0    ?Chloride 98 - 111 mmol/L 104   96   100    ?CO2 22 - 32 mmol/L 27   30   33    ?Calcium 8.9 - 10.3 mg/dL 8.0   10.0   9.6    ?Total Protein 6.5 - 8.1 g/dL  8.8   7.1    ?Total Bilirubin 0.3 - 1.2 mg/dL  0.6   0.3    ?Alkaline Phos 38 - 126 U/L  89     ?AST 15 - 41 U/L  19  13    ?ALT 0 - 44 U/L  20   19    ? ? ? ?Imaging studies:  ? ?KUB (05/19/2021) personally reviewed which shows some gaseous distension of small bowel , air seen in colon, no gastric distension, and NGT in place, and radiologist report reviewed below:  ?IMPRESSION: ?There is mild dilation of gas-filled small bowel loops suggesting ?ileus. ? ? ?Assessment/Plan: (ICD-10's: K56.7) ?56 y.o. female with abdominal pain, nausea, and diarrhea most likely secondary to ileus vs gastroenteritis. SBO is possible given surgical history but less likely given persistent bowel function/diarrhea and lack of identifiable transition point on imaging  ? ? - Would continue NPO for now + IVF resuscitation ? - Continue NGT decompression; monitor and record output ? - No indication for surgical interventions at this time ? - Monitor abdominal examination; on-going bowel function ? - Pain control prn; antiemetics prn ? - Serial KUBs  ? - Mobilization as tolerated  ? - Further management per primary service; we will  follow   ? ?All of the above findings and recommendations were discussed with the patient, and the medical team, and all of patient's questions were answered to her expressed satisfaction. ? ?-- ?Edison Simon, [name] ?[facility] ?05/19/2021, 7:18 AM ?M-F: 7am - 4pm ? ?

## 2021-05-20 ENCOUNTER — Inpatient Hospital Stay: Payer: Medicare Other

## 2021-05-20 ENCOUNTER — Encounter: Payer: Self-pay | Admitting: Endocrinology

## 2021-05-20 DIAGNOSIS — R569 Unspecified convulsions: Secondary | ICD-10-CM

## 2021-05-20 DIAGNOSIS — E1122 Type 2 diabetes mellitus with diabetic chronic kidney disease: Secondary | ICD-10-CM

## 2021-05-20 DIAGNOSIS — Z794 Long term (current) use of insulin: Secondary | ICD-10-CM

## 2021-05-20 DIAGNOSIS — K219 Gastro-esophageal reflux disease without esophagitis: Secondary | ICD-10-CM | POA: Diagnosis not present

## 2021-05-20 DIAGNOSIS — K56609 Unspecified intestinal obstruction, unspecified as to partial versus complete obstruction: Secondary | ICD-10-CM | POA: Diagnosis not present

## 2021-05-20 DIAGNOSIS — I10 Essential (primary) hypertension: Secondary | ICD-10-CM | POA: Diagnosis not present

## 2021-05-20 DIAGNOSIS — K567 Ileus, unspecified: Secondary | ICD-10-CM | POA: Diagnosis not present

## 2021-05-20 DIAGNOSIS — I5032 Chronic diastolic (congestive) heart failure: Secondary | ICD-10-CM | POA: Diagnosis not present

## 2021-05-20 DIAGNOSIS — N181 Chronic kidney disease, stage 1: Secondary | ICD-10-CM

## 2021-05-20 LAB — CBC
HCT: 35.1 % — ABNORMAL LOW (ref 36.0–46.0)
Hemoglobin: 10.8 g/dL — ABNORMAL LOW (ref 12.0–15.0)
MCH: 28.5 pg (ref 26.0–34.0)
MCHC: 30.8 g/dL (ref 30.0–36.0)
MCV: 92.6 fL (ref 80.0–100.0)
Platelets: 223 10*3/uL (ref 150–400)
RBC: 3.79 MIL/uL — ABNORMAL LOW (ref 3.87–5.11)
RDW: 14 % (ref 11.5–15.5)
WBC: 5.2 10*3/uL (ref 4.0–10.5)
nRBC: 0 % (ref 0.0–0.2)

## 2021-05-20 LAB — BASIC METABOLIC PANEL
Anion gap: 5 (ref 5–15)
BUN: 11 mg/dL (ref 6–20)
CO2: 27 mmol/L (ref 22–32)
Calcium: 8.1 mg/dL — ABNORMAL LOW (ref 8.9–10.3)
Chloride: 108 mmol/L (ref 98–111)
Creatinine, Ser: 0.99 mg/dL (ref 0.44–1.00)
GFR, Estimated: >60 mL/min (ref >60)
Glucose, Bld: 170 mg/dL — ABNORMAL HIGH (ref 70–99)
Potassium: 3.7 mmol/L (ref 3.5–5.1)
Sodium: 140 mmol/L (ref 135–145)

## 2021-05-20 LAB — GLUCOSE, CAPILLARY
Glucose-Capillary: 172 mg/dL — ABNORMAL HIGH (ref 70–99)
Glucose-Capillary: 129 mg/dL — ABNORMAL HIGH (ref 70–99)
Glucose-Capillary: 142 mg/dL — ABNORMAL HIGH (ref 70–99)
Glucose-Capillary: 120 mg/dL — ABNORMAL HIGH (ref 70–99)

## 2021-05-20 MED ORDER — POLYETHYLENE GLYCOL 3350 17 G PO PACK
17.0000 g | PACK | Freq: Every day | ORAL | Status: DC
  Administered 2021-05-20 – 2021-05-24 (×5): 17 g via ORAL
  Filled 2021-05-20 (×5): qty 1

## 2021-05-20 NOTE — Plan of Care (Signed)

## 2021-05-20 NOTE — Progress Notes (Signed)
?PROGRESS NOTE ? ?[name]    DOB: 01-23-1965, 56 y.o.  ?PZW:[id number]  ?  Code Status: Full Code   ?DOA: 05/18/2021   LOS: 2  ? ?Brief hospital course  ?[name] is a 56 y.o. female with a PMH significant for CVA with left-sided hemiparesis, post incisional hernia repair, morbid obesity (BMI 41.28), diabetes mellitus, COPD, chronic diastolic dysfunction CHF, seizures and sleep apnea . ? ?They presented from home to the ED on 05/18/2021 with severe abdominal pain x 1 day and watery Bms x3 days. She was recently discharged form hospital for similar. ? ?In the ED, it was found that they had stable vital signs with exception of tachycardia up to 124.  ?Significant findings included Na+ 138, K+ 3.5, Cl 96, bicarb 30, glucose 169, BUN 18, Cr 1.3, Ca++ 10, WBC 8.8, hgb 14, platelets 325. ?CT abdomen/pelvis-  fluid-filled dilated stomach is associated with dilated fluid-filled small bowel loops in the pelvis with decompressed distal ileum. A discrete or abrupt small bowel transition zone ?cannot be identified, but given the distal and terminal ileum is completely decompressed, component of small-bowel obstruction cannot be excluded. Despite the decompressed distal small bowel, the colon ?is also diffusely fluid-filled and distended throughout from the cecum to the rectum. Fluid in the rectum suggests diarrheal illness and ileus would be a consideration. Fluid-filled and distended appendix up to 8 mm diameter although no periappendiceal edema or inflammation. This is likely related to the colonic distension Bilateral nonobstructing renal stones. Stable 2 cm right adrenal adenoma. No followup recommended ? ?They were treated with IV fluids, NPO, analgesia, antiemetics, PPI, and home medications. General surgery was consulted.  ?Patient was admitted to medicine service for further workup and management of abdominal pain as outlined in detail below. ? ?5/1- NG tube removed spontaneously ? ?05/20/21  -stable, improved ? ?Assessment & Plan  ?Principal Problem: ?  Small bowel obstruction ([place]) ?Active Problems: ?  Seizure ([place]) ?  OSA (obstructive sleep apnea) ?  GERD without esophagitis ?  Essential hypertension, benign ?  Chronic diastolic CHF (congestive heart failure) ([place]) ?  Type 2 diabetes mellitus with chronic kidney disease, with long-term current use of insulin ([place]) ?  Obesity, Class III, BMI 40-49.9 (morbid obesity) ([place]) ? ?Abdominal pain- ileus vs SBO. H/o remote abdominal hernia repair and prior SBOs. Patient states that she feels much better today. NG tube removed spontaneously overnight. KUB today shows resolution of initial distention. ?- General surgery following, appreciate recs ? - serial KUBs ? - advancing diet ? - no further intervention at this time ?- continue IV hydration, titrate off if tolerating diet ?- CBC am ?- analgesia PRN ?- anti-emetics PRN ?- strict I/Os ?  ?Seizure disorder ([place])- Stable ?- patient on Keppra 500 mg IV every 12 since she is n.p.o.. transition to PO when tolerating diet ?- Seizure precautions ?  ?OSA- Stable ?- Continue CPAP at bedtime ?  ?GERD without esophagitis- Stable ?- Continue IV PPI ?  ?Essential hypertension ?Hold oral antihypertensive medications ?- IV metoprolol to avoid reflex tachycardia ?  ?Chronic diastolic CHF (congestive heart failure) ([place]) ?Stable and not acutely exacerbated ?Hold oral medications for now while patient is n.p.o. ?  ?Type 2 diabetes mellitus with chronic kidney disease, with long-term current use of insulin ([place]) ?Patient has a history of diabetes mellitus with stage IIIa chronic kidney disease. ?Hold insulin for now since patient is n.p.o. ?Check blood sugars with meals ?  ?Obesity, Class III, BMI 40-49.9 (  morbid obesity) ([place]) ?PMI 41.28 kg/m2 ?Complicates overall prognosis and care ?Lifestyle modification and exercise has been discussed with patient in detail ? ?Body mass index is 41.28 kg/m?. ? ?VTE ppx: lovenox ? ?Diet:  ?    ?Diet  ? Diet NPO time specified  ? ?Consultants: ?General surgery ?Subjective 05/20/21   ? ?Pt reports feeling much improved. She still has not had BM. Abdominal pain is mild. Denies Nausea.  ?  ?Objective  ? ?Vitals:  ? 05/19/21 1725 05/19/21 2041 05/20/21 0107 05/20/21 0617  ?BP: (!) 140/93 133/69 120/60 (!) 111/59  ?Pulse: 95 81 70 88  ?Resp: 17 19 16 16   ?Temp: 99.5 ?F (37.5 ?C) 99.3 ?F (37.4 ?C)  98.1 ?F (36.7 ?C)  ?TempSrc:  Oral    ?SpO2: 98% 97% 98% 97%  ?Weight:      ?Height:      ? ? ?Intake/Output Summary (Last 24 hours) at 05/20/2021 0753 ?Last data filed at 05/20/2021 7371 ?Gross per 24 hour  ?Intake 1664.67 ml  ?Output 600 ml  ?Net 1064.67 ml  ? ? ?Filed Weights  ? 05/18/21 0835  ?Weight: 116 kg  ?  ? ?Physical Exam:  ?General: awake, alert, apparent distress ?Respiratory: normal respiratory effort. ?Cardiovascular: quick capillary refill ?Gastrointestinal: soft, non-tender to palpation. No guarding or rebound ?Nervous: A&O x3. no gross focal neurologic deficits, normal speech ?Extremities: moves all equally, trace edema, normal tone ?Skin: dry, intact, normal temperature, normal color. No rashes, lesions or ulcers on exposed skin ?Psychiatry: anxious mood, congruent affect ? ?Labs   ?I have personally reviewed the following labs and imaging studies ?CBC ?   ?Component Value Date/Time  ? WBC 5.2 05/20/2021 0325  ? RBC 3.79 (L) 05/20/2021 0325  ? HGB 10.8 (L) 05/20/2021 0325  ? HGB 13.1 05/08/2015 1106  ? HCT 35.1 (L) 05/20/2021 0325  ? HCT 39.5 05/08/2015 1106  ? PLT 223 05/20/2021 0325  ? PLT 274 05/08/2015 1106  ? MCV 92.6 05/20/2021 0325  ? MCV 91 05/08/2015 1106  ? MCH 28.5 05/20/2021 0325  ? MCHC 30.8 05/20/2021 0325  ? RDW 14.0 05/20/2021 0325  ? RDW 14.1 05/08/2015 1106  ? LYMPHSABS 1.9 08/26/2020 1139  ? LYMPHSABS 1.7 05/08/2015 1106  ? MONOABS 0.6 08/26/2020 1139  ? EOSABS 0.0 08/26/2020 1139  ? EOSABS 0.0 05/08/2015 1106  ? BASOSABS 0.0 08/26/2020 1139  ? BASOSABS 0.0 05/08/2015 1106  ? ? ?   Latest Ref Rng & Units 05/20/2021  ?  3:25 AM 05/19/2021  ?  4:44 AM 05/18/2021  ?  9:02 AM  ?BMP  ?Glucose 70 - 99 mg/dL 170   205   169    ?BUN 6 - 20 mg/dL 11   13   18     ?Creatinine 0.44 - 1.00 mg/dL 0.99   1.13   1.33    ?Sodium 135 - 145 mmol/L 140   138   138    ?Potassium 3.5 - 5.1 mmol/L 3.7   3.8   3.5    ?Chloride 98 - 111 mmol/L 108   104   96    ?CO2 22 - 32 mmol/L 27   27   30     ?Calcium 8.9 - 10.3 mg/dL 8.1   8.0   10.0    ? ? ?DG Abdomen 1 View ? ?Result Date: 05/18/2021 ?CLINICAL DATA:  NG tube placement EXAM: ABDOMEN - 1 VIEW COMPARISON:  Same day CT FINDINGS: Limited radiograph of the lower chest and upper  abdomen was obtained for the purposes of enteric tube localization. Enteric tube is seen coursing below the diaphragm with distal tip and side port terminating within the expected location of the gastric body. Air-filled loops of large and small bowel noted within the included upper abdomen. IMPRESSION: Enteric tube within the gastric body. Electronically Signed   By: Davina Poke D.O.   On: 05/18/2021 12:34  ? ?CT ABDOMEN PELVIS W CONTRAST ? ?Result Date: 05/18/2021 ?CLINICAL DATA:  Abdominal pain with constipation and nausea. Concern for bowel obstruction. EXAM: CT ABDOMEN AND PELVIS WITH CONTRAST TECHNIQUE: Multidetector CT imaging of the abdomen and pelvis was performed using the standard protocol following bolus administration of intravenous contrast. RADIATION DOSE REDUCTION: This exam was performed according to the departmental dose-optimization program which includes automated exposure control, adjustment of the mA and/or kV according to patient size and/or use of iterative reconstruction technique. CONTRAST:  115m OMNIPAQUE IOHEXOL 300 MG/ML  SOLN COMPARISON:  04/22/2021 FINDINGS: Lower chest: Unremarkable. Hepatobiliary: No suspicious focal abnormality within the liver parenchyma. There is no evidence for gallstones, gallbladder wall thickening, or pericholecystic fluid. No  intrahepatic or extrahepatic biliary dilation. Pancreas: No focal mass lesion. No dilatation of the main duct. No intraparenchymal cyst. No peripancreatic edema. Spleen: No splenomegaly. No focal mass lesion. Adrenals

## 2021-05-20 NOTE — Progress Notes (Signed)
[facility] ?SURGICAL PROGRESS NOTE (cpt [phone]) ? ?Hospital Day(s): 2.  ? ?Interval History: Patient seen and examined, no acute events or new complaints overnight. Patient reports she is feeling a little better this morning. Abdominal distension certainly improved. She does not appear tender this morning. No fever, chills, nausea, emesis. She remains without leukocytosis; 5.2K. Renal function has returned to normal limits; sCr - 0.99; UO - unmeasured.  No significant electrolyte derangements. KUB with nonobstructive gas pattern. NGT had 600 ccs recorded throughout the first 12 hour yesterday however it seems that this slipped out at some point overnight.  ? ?Review of Systems:  ?Constitutional: denies fever, chills  ?HEENT: denies cough or congestion  ?Respiratory: denies any shortness of breath  ?Cardiovascular: denies chest pain or palpitations  ?Gastrointestinal: denies abdominal pain, N/V ?Genitourinary: denies burning with urination or urinary frequency ?Musculoskeletal: denies pain, decreased motor or sensation ? ?Vital signs in last 24 hours: [min-max] current  ?Temp:  [98.1 ?F (36.7 ?C)-99.5 ?F (37.5 ?C)] 98.1 ?F (36.7 ?C) (05/02 0617) ?Pulse Rate:  [70-105] 88 (05/02 0617) ?Resp:  [16-19] 16 (05/02 0617) ?BP: (111-140)/(59-93) 111/59 (05/02 0617) ?SpO2:  [97 %-98 %] 97 % (05/02 0617)     Height: 5' 6"  (167.6 cm) Weight: 116 kg BMI (Calculated): 41.3  ? ?Intake/Output last 2 shifts:  ?05/01 0701 - 05/02 0700 ?In: 1664.7 [I.V.:1364.7; IV [id number]:388] ?Out: 600 [Emesis/NG output:600]  ? ?Physical Exam:  ?Constitutional: alert, cooperative and no distress  ?HENT: normocephalic without obvious abnormality; NGT in place; minimal output ?Eyes: PERRL, EOM's grossly intact and symmetric  ?Respiratory: breathing non-labored at rest  ?Cardiovascular: regular rate and sinus rhythm  ?Gastrointestinal: Abdomen is obese, soft, distension appears improved (difficult given body habitus), no  rebound/guarding. She is certainly not peritonitic ?Musculoskeletal: no edema or wounds, motor and sensation grossly intact, NT  ? ? ?Labs:  ? ?  Latest Ref Rng & Units 05/20/2021  ?  3:25 AM 05/19/2021  ?  4:44 AM 05/18/2021  ?  9:02 AM  ?CBC  ?WBC 4.0 - 10.5 K/uL 5.2   7.0   8.8    ?Hemoglobin 12.0 - 15.0 g/dL 10.8   11.4   14.0    ?Hematocrit 36.0 - 46.0 % 35.1   37.1   45.3    ?Platelets 150 - 400 K/uL 223   227   325    ? ? ?  Latest Ref Rng & Units 05/20/2021  ?  3:25 AM 05/19/2021  ?  4:44 AM 05/18/2021  ?  9:02 AM  ?CMP  ?Glucose 70 - 99 mg/dL 170   205   169    ?BUN 6 - 20 mg/dL 11   13   18     ?Creatinine 0.44 - 1.00 mg/dL 0.99   1.13   1.33    ?Sodium 135 - 145 mmol/L 140   138   138    ?Potassium 3.5 - 5.1 mmol/L 3.7   3.8   3.5    ?Chloride 98 - 111 mmol/L 108   104   96    ?CO2 22 - 32 mmol/L 27   27   30     ?Calcium 8.9 - 10.3 mg/dL 8.1   8.0   10.0    ?Total Protein 6.5 - 8.1 g/dL   8.8    ?Total Bilirubin 0.3 - 1.2 mg/dL   0.6    ?Alkaline Phos 38 - 126 U/L   89    ?AST 15 - 41  U/L   19    ?ALT 0 - 44 U/L   20    ? ? ? ?Imaging studies:  ? ?KUB (05/19/2021) personally reviewed which shows resolution in previous small bowel distension, air in colon/rectum, NGT removed, and radiologist report reviewed below:  ?IMPRESSION: ?Nonobstructive bowel gas pattern. ? ? ?Assessment/Plan: (ICD-10's: K56.7) ?56 y.o. female with clinically and radiographically resolving ileus. SBO is possible given surgical history but less likely given persistent bowel function/diarrhea and lack of identifiable transition point on imaging  ? ? - Will initiate CLD this morning   ? - Can hold off on replacing NGT ? - No indication for surgical interventions at this time ? - Monitor abdominal examination; on-going bowel function ? - Pain control prn; antiemetics prn ? - Serial KUBs as needed ? - Mobilization as tolerated  ? - Further management per primary service; we will follow   ? ?All of the above findings and recommendations were  discussed with the patient, and the medical team, and all of patient's questions were answered to her expressed satisfaction. ? ?-- ?Edison Simon, [name] ?[facility] ?05/20/2021, 7:52 AM ?M-F: 7am - 4pm ? ?

## 2021-05-21 ENCOUNTER — Other Ambulatory Visit: Payer: Self-pay | Admitting: Physician Assistant

## 2021-05-21 ENCOUNTER — Inpatient Hospital Stay: Payer: Medicare Other

## 2021-05-21 DIAGNOSIS — I10 Essential (primary) hypertension: Secondary | ICD-10-CM | POA: Diagnosis not present

## 2021-05-21 DIAGNOSIS — K567 Ileus, unspecified: Secondary | ICD-10-CM | POA: Diagnosis not present

## 2021-05-21 DIAGNOSIS — I5032 Chronic diastolic (congestive) heart failure: Secondary | ICD-10-CM | POA: Diagnosis not present

## 2021-05-21 DIAGNOSIS — M62838 Other muscle spasm: Secondary | ICD-10-CM

## 2021-05-21 DIAGNOSIS — N1831 Chronic kidney disease, stage 3a: Secondary | ICD-10-CM

## 2021-05-21 DIAGNOSIS — G894 Chronic pain syndrome: Secondary | ICD-10-CM

## 2021-05-21 DIAGNOSIS — K219 Gastro-esophageal reflux disease without esophagitis: Secondary | ICD-10-CM | POA: Diagnosis not present

## 2021-05-21 DIAGNOSIS — K56609 Unspecified intestinal obstruction, unspecified as to partial versus complete obstruction: Secondary | ICD-10-CM | POA: Diagnosis not present

## 2021-05-21 LAB — GLUCOSE, CAPILLARY
Glucose-Capillary: 135 mg/dL — ABNORMAL HIGH (ref 70–99)
Glucose-Capillary: 143 mg/dL — ABNORMAL HIGH (ref 70–99)
Glucose-Capillary: 110 mg/dL — ABNORMAL HIGH (ref 70–99)
Glucose-Capillary: 142 mg/dL — ABNORMAL HIGH (ref 70–99)

## 2021-05-21 MED ORDER — FLUTICASONE PROPIONATE 50 MCG/ACT NA SUSP
2.0000 | Freq: Every day | NASAL | Status: DC
  Administered 2021-05-21 – 2021-05-24 (×4): 2 via NASAL
  Filled 2021-05-21: qty 16

## 2021-05-21 MED ORDER — HYDROCHLOROTHIAZIDE 25 MG PO TABS
25.0000 mg | ORAL_TABLET | Freq: Every day | ORAL | Status: DC
  Filled 2021-05-21 (×4): qty 1

## 2021-05-21 MED ORDER — GABAPENTIN 600 MG PO TABS
600.0000 mg | ORAL_TABLET | Freq: Two times a day (BID) | ORAL | Status: DC
  Administered 2021-05-21 – 2021-05-24 (×7): 600 mg via ORAL
  Filled 2021-05-21 (×7): qty 1

## 2021-05-21 MED ORDER — GABAPENTIN 600 MG PO TABS
1200.0000 mg | ORAL_TABLET | Freq: Every day | ORAL | Status: DC
  Administered 2021-05-21 – 2021-05-23 (×3): 1200 mg via ORAL
  Filled 2021-05-21 (×3): qty 2

## 2021-05-21 MED ORDER — AMITRIPTYLINE HCL 25 MG PO TABS
75.0000 mg | ORAL_TABLET | Freq: Every day | ORAL | Status: DC
  Administered 2021-05-21 – 2021-05-23 (×3): 75 mg via ORAL
  Filled 2021-05-21 (×3): qty 3

## 2021-05-21 MED ORDER — SPIRONOLACTONE 25 MG PO TABS
25.0000 mg | ORAL_TABLET | Freq: Every day | ORAL | Status: DC
  Administered 2021-05-21 – 2021-05-24 (×4): 25 mg via ORAL
  Filled 2021-05-21 (×4): qty 1

## 2021-05-21 MED ORDER — LEVETIRACETAM 500 MG PO TABS
500.0000 mg | ORAL_TABLET | Freq: Every day | ORAL | Status: DC
  Administered 2021-05-21 – 2021-05-24 (×4): 500 mg via ORAL
  Filled 2021-05-21 (×4): qty 1

## 2021-05-21 MED ORDER — OLMESARTAN MEDOXOMIL-HCTZ 40-25 MG PO TABS: 1.0000 | ORAL_TABLET | Freq: Every day | ORAL | Status: DC

## 2021-05-21 MED ORDER — OXYCODONE HCL 5 MG PO TABS
5.0000 mg | ORAL_TABLET | ORAL | Status: DC | PRN
  Administered 2021-05-21 – 2021-05-24 (×7): 5 mg via ORAL
  Filled 2021-05-21 (×7): qty 1

## 2021-05-21 MED ORDER — SORBITOL 70 % SOLN
960.0000 mL | TOPICAL_OIL | Freq: Once | ORAL | Status: AC
  Administered 2021-05-21: 960 mL via RECTAL
  Filled 2021-05-21: qty 473

## 2021-05-21 MED ORDER — IRBESARTAN 150 MG PO TABS
300.0000 mg | ORAL_TABLET | Freq: Every day | ORAL | Status: DC
  Administered 2021-05-21 – 2021-05-24 (×4): 300 mg via ORAL
  Filled 2021-05-21 (×4): qty 2

## 2021-05-21 MED ORDER — CARVEDILOL 25 MG PO TABS
25.0000 mg | ORAL_TABLET | Freq: Two times a day (BID) | ORAL | Status: DC
  Administered 2021-05-21 – 2021-05-24 (×7): 25 mg via ORAL
  Filled 2021-05-21 (×7): qty 1

## 2021-05-21 MED ORDER — DULOXETINE HCL 30 MG PO CPEP
60.0000 mg | ORAL_CAPSULE | Freq: Every day | ORAL | Status: DC
  Administered 2021-05-21 – 2021-05-24 (×4): 60 mg via ORAL
  Filled 2021-05-21 (×4): qty 2

## 2021-05-21 MED ORDER — LEVETIRACETAM 750 MG PO TABS
750.0000 mg | ORAL_TABLET | Freq: Every day | ORAL | Status: DC
  Administered 2021-05-21 – 2021-05-23 (×3): 750 mg via ORAL
  Filled 2021-05-21 (×3): qty 1

## 2021-05-21 MED ORDER — CLOPIDOGREL BISULFATE 75 MG PO TABS
75.0000 mg | ORAL_TABLET | Freq: Every day | ORAL | Status: DC
Start: 2021-05-21 — End: 2021-05-24
  Administered 2021-05-21 – 2021-05-24 (×4): 75 mg via ORAL
  Filled 2021-05-21 (×4): qty 1

## 2021-05-21 MED ORDER — ACETAMINOPHEN 325 MG PO TABS: 650.0000 mg | ORAL_TABLET | Freq: Four times a day (QID) | ORAL | Status: DC | PRN

## 2021-05-21 MED ORDER — BISACODYL 5 MG PO TBEC
5.0000 mg | DELAYED_RELEASE_TABLET | Freq: Every day | ORAL | Status: DC
  Administered 2021-05-21 – 2021-05-24 (×4): 5 mg via ORAL
  Filled 2021-05-21 (×4): qty 1

## 2021-05-21 NOTE — Care Management Important Message (Signed)
Important Message ? ?Patient Details  ?Name: [name] ?MRN: [id number] ?Date of Birth: Dec 06, 1965 ? ? ?Medicare Important Message Given:  Yes ? ? ? ? ?Juliann Pulse A Andi Mahaffy ?05/21/2021, 2:30 PM ?

## 2021-05-21 NOTE — Progress Notes (Signed)
?PROGRESS NOTE ? ?[name]    DOB: 12-17-1965, 56 y.o.  ?[id number]  ?  Code Status: Full Code   ?DOA: 05/18/2021   LOS: 3  ? ?Brief hospital course  ?[name] is a 56 y.o. female with a PMH significant for CVA with left-sided hemiparesis, post incisional hernia repair, morbid obesity (BMI 41.28), diabetes mellitus, COPD, chronic diastolic dysfunction CHF, seizures and sleep apnea . ? ?They presented from home to the ED on 05/18/2021 with severe abdominal pain x 1 day and watery Bms x3 days. She was recently discharged form hospital for similar. ? ?In the ED, it was found that they had stable vital signs with exception of tachycardia up to 124.  ?Significant findings included Na+ 138, K+ 3.5, Cl 96, bicarb 30, glucose 169, BUN 18, Cr 1.3, Ca++ 10, WBC 8.8, hgb 14, platelets 325. ?CT abdomen/pelvis-  fluid-filled dilated stomach is associated with dilated fluid-filled small bowel loops in the pelvis with decompressed distal ileum. A discrete or abrupt small bowel transition zone ?cannot be identified, but given the distal and terminal ileum is completely decompressed, component of small-bowel obstruction cannot be excluded. Despite the decompressed distal small bowel, the colon ?is also diffusely fluid-filled and distended throughout from the cecum to the rectum. Fluid in the rectum suggests diarrheal illness and ileus would be a consideration. Fluid-filled and distended appendix up to 8 mm diameter although no periappendiceal edema or inflammation. This is likely related to the colonic distension Bilateral nonobstructing renal stones. Stable 2 cm right adrenal adenoma. No followup recommended ? ?They were treated with IV fluids, NPO, analgesia, antiemetics, PPI, and home medications. General surgery was consulted.  ?Patient was admitted to medicine service for further workup and management of abdominal pain as outlined in detail below. ? ?5/1- NG tube removed spontaneously ? ?05/21/21  -stable, improved ? ?Assessment & Plan  ?Principal Problem: ?  Small bowel obstruction ([place]) ?Active Problems: ?  Seizure ([place]) ?  OSA (obstructive sleep apnea) ?  GERD without esophagitis ?  Essential hypertension, benign ?  Chronic diastolic CHF (congestive heart failure) ([place]) ?  Type 2 diabetes mellitus with chronic kidney disease, with long-term current use of insulin ([place]) ?  Obesity, Class III, BMI 40-49.9 (morbid obesity) ([place]) ? ?Abdominal pain- ileus vs SBO. Improving. H/o remote abdominal hernia repair and prior SBOs. Patient states that she feels much better today. KUB today shows resolution of initial distention. No BM since Sunday and that was just water, per patient. ?- General surgery following, appreciate recs ? - serial KUBs ? - advancing diet- full liquid today ? - no further intervention at this time ?- discontinue IV hydration ?- CBC am ?- analgesia PRN ?- anti-emetics PRN ?- strict I/Os ?- bowel regimen including SMOG enema today ?  ?Seizure disorder ([place])- Stable ?- transition back to PO home medications ?- Seizure precautions ?  ?OSA- Stable ?- Continue CPAP at bedtime ?  ?GERD without esophagitis- Stable ?- Continue PPI ?  ?Essential hypertension ?- transitioning back to home PO antihypertensives ?  ?Chronic diastolic CHF (congestive heart failure) ([place]) ?Stable and not acutely exacerbated ?- restarted home meds ?  ?Type 2 diabetes mellitus with chronic kidney disease, with long-term current use of insulin ([place]) ?Patient has a history of diabetes mellitus with stage IIIa chronic kidney disease. ?Check blood sugars with meals, sSSI ?  ?Obesity, Class III, BMI 40-49.9 (morbid obesity) ([place]) ?PMI 41.28 kg/m2 ?Complicates overall prognosis and care ?Lifestyle modification and exercise has been  discussed with patient in detail ? ?Body mass index is 41.28 kg/m?. ? ?VTE ppx: lovenox ? ?Diet:  ?   ?Diet  ? Diet clear liquid Room service appropriate? Yes; Fluid consistency: Thin   ? ?Consultants: ?General surgery ?Subjective 05/21/21   ? ?Pt reports feeling much improved. She still has not had BM. She was able to tolerate clear liquids and wants to advance her diet.  ?  ?Objective  ? ?Vitals:  ? 05/20/21 1940 05/21/21 0037 05/21/21 6720 05/21/21 0719  ?BP: (!) 149/80 126/63 (!) 147/81 (!) 143/83  ?Pulse: 81 83 82 82  ?Resp: 20  18 17   ?Temp: 98.4 ?F (36.9 ?C)  97.7 ?F (36.5 ?C) 98.2 ?F (36.8 ?C)  ?TempSrc: Oral  Oral Oral  ?SpO2: 100%  99% 97%  ?Weight:      ?Height:      ? ? ?Intake/Output Summary (Last 24 hours) at 05/21/2021 0810 ?Last data filed at 05/21/2021 0416 ?Gross per 24 hour  ?Intake 640 ml  ?Output 700 ml  ?Net -60 ml  ? ? ?Filed Weights  ? 05/18/21 0835  ?Weight: 116 kg  ?  ? ?Physical Exam:  ?General: awake, alert, apparent distress ?Respiratory: normal respiratory effort. ?Cardiovascular: quick capillary refill ?Gastrointestinal: soft, non-tender to palpation. No guarding or rebound ?Nervous: A&O x3. no gross focal neurologic deficits, normal speech ?Extremities: moves all equally, trace edema, normal tone ?Skin: dry, intact, normal temperature, normal color. No rashes, lesions or ulcers on exposed skin ?Psychiatry: anxious mood, congruent affect ? ?Labs   ?I have personally reviewed the following labs and imaging studies ?CBC ?   ?Component Value Date/Time  ? WBC 5.2 05/20/2021 0325  ? RBC 3.79 (L) 05/20/2021 0325  ? HGB 10.8 (L) 05/20/2021 0325  ? HGB 13.1 05/08/2015 1106  ? HCT 35.1 (L) 05/20/2021 0325  ? HCT 39.5 05/08/2015 1106  ? PLT 223 05/20/2021 0325  ? PLT 274 05/08/2015 1106  ? MCV 92.6 05/20/2021 0325  ? MCV 91 05/08/2015 1106  ? MCH 28.5 05/20/2021 0325  ? MCHC 30.8 05/20/2021 0325  ? RDW 14.0 05/20/2021 0325  ? RDW 14.1 05/08/2015 1106  ? LYMPHSABS 1.9 08/26/2020 1139  ? LYMPHSABS 1.7 05/08/2015 1106  ? MONOABS 0.6 08/26/2020 1139  ? EOSABS 0.0 08/26/2020 1139  ? EOSABS 0.0 05/08/2015 1106  ? BASOSABS 0.0 08/26/2020 1139  ? BASOSABS 0.0 05/08/2015 1106  ? ? ?   Latest Ref Rng & Units 05/20/2021  ?  3:25 AM 05/19/2021  ?  4:44 AM 05/18/2021  ?  9:02 AM  ?BMP  ?Glucose 70 - 99 mg/dL 170   205   169    ?BUN 6 - 20 mg/dL 11   13   18     ?Creatinine 0.44 - 1.00 mg/dL 0.99   1.13   1.33    ?Sodium 135 - 145 mmol/L 140   138   138    ?Potassium 3.5 - 5.1 mmol/L 3.7   3.8   3.5    ?Chloride 98 - 111 mmol/L 108   104   96    ?CO2 22 - 32 mmol/L 27   27   30     ?Calcium 8.9 - 10.3 mg/dL 8.1   8.0   10.0    ? ? ?DG Abd 2 Views ? ?Result Date: 05/20/2021 ?CLINICAL DATA:  Ileus EXAM: ABDOMEN - 2 VIEW COMPARISON:  05/19/2021 FINDINGS: Scattered gas and large and small bowel. Scattered mild stool including rectum. No bowel dilatation  or bowel wall thickening. Lung bases clear. Bones demineralized. IMPRESSION: Nonobstructive bowel gas pattern. Electronically Signed   By: Lavonia Dana M.D.   On: 05/20/2021 08:27  ? ?DG Abd 2 Views ? ?Result Date: 05/19/2021 ?CLINICAL DATA:  Abdominal pain EXAM: ABDOMEN - 2 VIEW COMPARISON:  05/18/2021 FINDINGS: There is mild dilation of small-bowel loops measuring up to 3.6 cm. Gas and stool are present in colon. Tip of enteric tube is seen in the region of body of stomach. Stomach is not distended. Are no abnormal calcifications or mass lesions. Kidneys are partly obscured by bowel contents. Linear densities in the lower lung fields suggest subsegmental atelectasis. IMPRESSION: There is mild dilation of gas-filled small bowel loops suggesting ileus. Electronically Signed   By: Elmer Picker M.D.   On: 05/19/2021 08:53   ? ?Disposition Plan & Communication  ?Patient status: Inpatient  ?Admitted From: Home ?Planned disposition location: Home ?Anticipated discharge date: 5/4 pending improvement ? ?Family Communication: none   ?  ?Author: ?Richarda Osmond, DO ?[facility] ?05/21/2021, 8:10 AM  ? ?Available by Epic secure chat 7AM-7PM. ?If 7PM-7AM, please contact night-coverage.  ?[facility] contact information found on [email or web address]. ? ?

## 2021-05-21 NOTE — Telephone Encounter (Signed)
Requested medication (s) are due for refill today: yes ? ?Requested medication (s) are on the active medication list: yes ? ?Last refill:  03/18/21 #180 0 refills ? ?Future visit scheduled:  yes in 2 months ? ?Notes to clinic:  not delegated per protocol ? ? ?  ?Requested Prescriptions  ?Pending Prescriptions Disp Refills  ? baclofen (LIORESAL) 10 MG tablet [Pharmacy Med Name: BACLOFEN 10 MG TABLET] 180 tablet 0  ?  Sig: TAKE 1 TABLET BY MOUTH THREE TIMES A DAY  ?  ? Analgesics:  Muscle Relaxants - baclofen Passed - 05/21/2021  9:31 AM  ?  ?  Passed - Cr in normal range and within 180 days  ?  Creat  ?Date Value Ref Range Status  ?05/13/2021 1.26 (H) 0.50 - 1.03 mg/dL Final  ? ?Creatinine, Ser  ?Date Value Ref Range Status  ?05/20/2021 0.99 0.44 - 1.00 mg/dL Final  ? ?Creatinine,U  ?Date Value Ref Range Status  ?10/03/2020 128.5 mg/dL Final  ? ?Creatinine, Urine  ?Date Value Ref Range Status  ?01/24/2018 48 20 - 275 mg/dL Final  ?  ?  ?  ?  Passed - eGFR is 30 or above and within 180 days  ?  GFR, Est African American  ?Date Value Ref Range Status  ?11/28/2018 94 > OR = 60 mL/min/1.35m Final  ? ?GFR calc Af Amer  ?Date Value Ref Range Status  ?05/30/2019 >60 >60 mL/min Final  ? ?GFR, Est Non African American  ?Date Value Ref Range Status  ?11/28/2018 81 > OR = 60 mL/min/1.759mFinal  ? ?GFR, Estimated  ?Date Value Ref Range Status  ?05/20/2021 >60 >60 mL/min Final  ?  Comment:  ?  (NOTE) ?Calculated using the CKD-EPI Creatinine Equation (2021) ?  ? ?GFR  ?Date Value Ref Range Status  ?09/30/2020 76.80 >60.00 mL/min Final  ?  Comment:  ?  Calculated using the CKD-EPI Creatinine Equation (2021)  ? ?eGFR  ?Date Value Ref Range Status  ?05/13/2021 50 (L) > OR = 60 mL/min/1.7342minal  ?  Comment:  ?  The eGFR is based on the CKD-EPI 2021 equation. To calculate  ?the new eGFR from a previous Creatinine or Cystatin C ?result, go to https://www.kidney.org/professionals/ ?kdoqi/gfr%5Fcalculator ?  ?  ?  ?  ?  Passed - Valid  encounter within last 6 months  ?  Recent Outpatient Visits   ? ?      ? 1 week ago [facility] discharge follow-up  ? [facility]  ? 3 months ago [place] [facility] [place]  ? 4 months ago Uncontrolled hypertension  ? [facility]  ? 5 months ago Onychauxis  ? [facility]  ? 6 months [facility]  ? ?  ?  ?Future Appointments   ? ?        ? In 2 months [facility] In 10 months  [facility] ?  ? ? ?  ?  ?  ? ?

## 2021-05-21 NOTE — Progress Notes (Signed)
[facility] ?SURGICAL PROGRESS NOTE (cpt [phone]) ? ?Hospital Day(s): 3.  ? ?Interval History: Patient seen and examined, no acute events or new complaints overnight. Patient reports she continues to have crampy central abdominal pain. She denied fever, chills, nausea, emesis. No new labs this morning. KUB pending. She has been on CLD; passing flatus. No BM. ? ?Review of Systems:  ?Constitutional: denies fever, chills  ?HEENT: denies cough or congestion  ?Respiratory: denies any shortness of breath  ?Cardiovascular: denies chest pain or palpitations  ?Gastrointestinal: + abdominal pain, denied N/V ?Genitourinary: denies burning with urination or urinary frequency ?Musculoskeletal: denies pain, decreased motor or sensation ? ?Vital signs in last 24 hours: [min-max] current  ?Temp:  [97.7 ?F (36.5 ?C)-98.4 ?F (36.9 ?C)] 97.7 ?F (36.5 ?C) (05/03 1194) ?Pulse Rate:  [81-87] 82 (05/03 0433) ?Resp:  [18-20] 18 (05/03 0433) ?BP: (116-154)/(63-120) 147/81 (05/03 0433) ?SpO2:  [99 %-100 %] 99 % (05/03 0433)     Height: 5' 6"  (167.6 cm) Weight: 116 kg BMI (Calculated): 41.3  ? ?Intake/Output last 2 shifts:  ?05/02 0701 - 05/03 0700 ?In: 640 [P.O.:540; IV Piggyback:100] ?Out: 700 [Urine:700]  ? ?Physical Exam:  ?Constitutional: alert, cooperative and no distress  ?HENT: normocephalic without obvious abnormality; NGT in place; minimal output ?Eyes: PERRL, EOM's grossly intact and symmetric  ?Respiratory: breathing non-labored at rest  ?Cardiovascular: regular rate and sinus rhythm  ?Gastrointestinal: Abdomen is obese, soft, central abdominal soreness, distension appears improved (difficult given body habitus), no rebound/guarding. She is certainly not peritonitic ?Musculoskeletal: no edema or wounds, motor and sensation grossly intact, NT  ?  ? ? ?Labs:  ? ?  Latest Ref Rng & Units 05/20/2021  ?  3:25 AM 05/19/2021  ?  4:44 AM 05/18/2021  ?  9:02 AM  ?CBC  ?WBC 4.0 - 10.5 K/uL 5.2   7.0   8.8    ?Hemoglobin 12.0 -  15.0 g/dL 10.8   11.4   14.0    ?Hematocrit 36.0 - 46.0 % 35.1   37.1   45.3    ?Platelets 150 - 400 K/uL 223   227   325    ? ? ?  Latest Ref Rng & Units 05/20/2021  ?  3:25 AM 05/19/2021  ?  4:44 AM 05/18/2021  ?  9:02 AM  ?CMP  ?Glucose 70 - 99 mg/dL 170   205   169    ?BUN 6 - 20 mg/dL 11   13   18     ?Creatinine 0.44 - 1.00 mg/dL 0.99   1.13   1.33    ?Sodium 135 - 145 mmol/L 140   138   138    ?Potassium 3.5 - 5.1 mmol/L 3.7   3.8   3.5    ?Chloride 98 - 111 mmol/L 108   104   96    ?CO2 22 - 32 mmol/L 27   27   30     ?Calcium 8.9 - 10.3 mg/dL 8.1   8.0   10.0    ?Total Protein 6.5 - 8.1 g/dL   8.8    ?Total Bilirubin 0.3 - 1.2 mg/dL   0.6    ?Alkaline Phos 38 - 126 U/L   89    ?AST 15 - 41 U/L   19    ?ALT 0 - 44 U/L   20    ? ? ?Imaging studies: KUB pending this morning  ? ? ?Assessment/Plan: (ICD-10's: K56.7) ?56 y.o. female with clinically and radiographically resolving  ileus. SBO is possible given surgical history but less likely given persistent bowel function/diarrhea and lack of identifiable transition point on imaging  ?  ?           - Continue CLD for now. She is pending KUB. If KUB appears stable/improved, we can advance diet.  ?   ?           - No indication for surgical interventions at this time ?           - Monitor abdominal examination; on-going bowel function ?           - Pain control prn; antiemetics prn ? - Continue bowel regimen  ?           - Serial KUBs as needed; pending this AM ?           - Mobilization as tolerated  ?           - Further management per primary service; we will follow   ?  ?All of the above findings and recommendations were discussed with the patient, and the medical team, and all of patient's questions were answered to her expressed satisfaction. ? ?-- ?Edison Simon, [name] ?[facility] ?05/21/2021, 7:23 AM ?M-F: 7am - 4pm ? ?

## 2021-05-22 ENCOUNTER — Ambulatory Visit: Payer: Medicare Other | Admitting: Student in an Organized Health Care Education/Training Program

## 2021-05-22 ENCOUNTER — Telehealth: Payer: Medicare Other

## 2021-05-22 LAB — GLUCOSE, CAPILLARY
Glucose-Capillary: 173 mg/dL — ABNORMAL HIGH (ref 70–99)
Glucose-Capillary: 192 mg/dL — ABNORMAL HIGH (ref 70–99)
Glucose-Capillary: 171 mg/dL — ABNORMAL HIGH (ref 70–99)
Glucose-Capillary: 214 mg/dL — ABNORMAL HIGH (ref 70–99)

## 2021-05-22 MED ORDER — PANTOPRAZOLE SODIUM 40 MG PO TBEC
40.0000 mg | DELAYED_RELEASE_TABLET | Freq: Every day | ORAL | Status: DC
  Administered 2021-05-22 – 2021-05-24 (×3): 40 mg via ORAL
  Filled 2021-05-22 (×3): qty 1

## 2021-05-22 NOTE — Progress Notes (Signed)
?PROGRESS NOTE ? ?[name]    DOB: 1965-04-30, 56 y.o.  ?WJX:[id number]  ?  Code Status: Full Code   ?DOA: 05/18/2021   LOS: 4  ? ?Brief hospital course  ?[name] is a 56 y.o. female with a PMH significant for CVA with left-sided hemiparesis, post incisional hernia repair, morbid obesity (BMI 41.28), diabetes mellitus, COPD, chronic diastolic dysfunction CHF, seizures and sleep apnea . ? ?They presented from home to the ED on 05/18/2021 with severe abdominal pain x 1 day and watery Bms x3 days. She was recently discharged form hospital for similar. ? ?In the ED, it was found that they had stable vital signs with exception of tachycardia up to 124.  ?Significant findings included Na+ 138, K+ 3.5, Cl 96, bicarb 30, glucose 169, BUN 18, Cr 1.3, Ca++ 10, WBC 8.8, hgb 14, platelets 325. ?CT abdomen/pelvis-  fluid-filled dilated stomach is associated with dilated fluid-filled small bowel loops in the pelvis with decompressed distal ileum. A discrete or abrupt small bowel transition zone ?cannot be identified, but given the distal and terminal ileum is completely decompressed, component of small-bowel obstruction cannot be excluded. Despite the decompressed distal small bowel, the colon ?is also diffusely fluid-filled and distended throughout from the cecum to the rectum. Fluid in the rectum suggests diarrheal illness and ileus would be a consideration. Fluid-filled and distended appendix up to 8 mm diameter although no periappendiceal edema or inflammation. This is likely related to the colonic distension Bilateral nonobstructing renal stones. Stable 2 cm right adrenal adenoma. No followup recommended ? ?They were treated with IV fluids, NPO, analgesia, antiemetics, PPI, and home medications. General surgery was consulted.  ?Patient was admitted to medicine service for further workup and management of abdominal pain as outlined in detail below. ? ?5/1- NG tube removed spontaneously ? ?05/22/21  -stable, improved ? ?Assessment & Plan  ?Principal Problem: ?  Small bowel obstruction ([place]) ?Active Problems: ?  Seizure ([place]) ?  OSA (obstructive sleep apnea) ?  GERD without esophagitis ?  Essential hypertension, benign ?  Chronic diastolic CHF (congestive heart failure) ([place]) ?  Type 2 diabetes mellitus with chronic kidney disease, with long-term current use of insulin ([place]) ?  Obesity, Class III, BMI 40-49.9 (morbid obesity) ([place]) ? ?Abdominal pain- ileus vs SBO. Improving. H/o remote abdominal hernia repair and prior SBOs. Patient states that she feels much better today. KUB today shows resolution of initial distention. No BM since Sunday and that was just water, per patient. ?- General surgery following, appreciate recs ? - serial KUBs ? - advancing diet-heart healthy carb modified today ? - no further intervention at this time ?-Off IV hydration ?- CBC am ?- analgesia PRN ?- anti-emetics PRN ?- strict I/Os ?- bowel regimen including SMOG enema today ?  ?Seizure disorder ([place])- Stable ?- transition back to PO home medications ?- Seizure precautions ?  ?OSA- Stable ?- Continue CPAP at bedtime ?  ?GERD without esophagitis- Stable ?- Continue PPI ?  ?Essential hypertension ?- transitioning back to home PO antihypertensives ?  ?Chronic diastolic CHF (congestive heart failure) ([place]) ?Stable and not acutely exacerbated ?- restarted home meds ?  ?Type 2 diabetes mellitus with chronic kidney disease, with long-term current use of insulin ([place]) ?Patient has a history of diabetes mellitus with stage IIIa chronic kidney disease. ?Check blood sugars with meals, sSSI ?  ? ?Morbid obesity Body mass index is 41.28 kg/m?[name] ?Complicates overall care and prognosis.  Recommend lifestyle modifications including physical activity and  diet for weight loss and overall long-term health. ? ? ?VTE ppx: lovenox ? ?Diet:  ?   ?Diet  ? Diet heart healthy/carb modified Room service appropriate? Yes; Fluid consistency: Thin   ? ?Consultants: ?General surgery ?Subjective 05/22/21   ? ?Pt awake sitting up in bed this morning.  She reports overall feeling better.  She has had multiple BMs that are now a mix of watery with some formed stool.  Hope she does well with advancement of diet today ?  ?Objective  ? ?Vitals:  ? 05/21/21 1615 05/21/21 2009 05/22/21 0441 05/22/21 0814  ?BP: (!) 149/91 (!) 160/87 124/71 (!) 141/63  ?Pulse: 79 71 71 72  ?Resp: 15 (!) 21 17 18   ?Temp: 97.7 ?F (36.5 ?C) 98.5 ?F (36.9 ?C) 97.7 ?F (36.5 ?C) 98.1 ?F (36.7 ?C)  ?TempSrc:   Axillary Oral  ?SpO2: 97% 97% 98% 98%  ?Weight:      ?Height:      ? ? ?Intake/Output Summary (Last 24 hours) at 05/22/2021 1413 ?Last data filed at 05/21/2021 1856 ?Gross per 24 hour  ?Intake 480 ml  ?Output --  ?Net 480 ml  ? ?Filed Weights  ? 05/18/21 0835  ?Weight: 116 kg  ?  ? ?Physical Exam:  ?General: awake, alert, apparent distress ?Respiratory: normal respiratory effort. ?Cardiovascular: quick capillary refill, RRR ?Gastrointestinal: Abdomen soft nondistended nontender no guarding or rebound, hypoactive bowel sounds ?Nervous: A&O x3. no gross focal neurologic deficits, normal speech ?Extremities: Moves all extremities, trace peripheral edema in the legs ?Skin: Dry, intact, no rashes seen on visualized skin ?Psychiatry: Normal mood, congruent affect ? ?Labs   ?I have personally reviewed the following labs and imaging studies ?CBC ?   ?Component Value Date/Time  ? WBC 5.2 05/20/2021 0325  ? RBC 3.79 (L) 05/20/2021 0325  ? HGB 10.8 (L) 05/20/2021 0325  ? HGB 13.1 05/08/2015 1106  ? HCT 35.1 (L) 05/20/2021 0325  ? HCT 39.5 05/08/2015 1106  ? PLT 223 05/20/2021 0325  ? PLT 274 05/08/2015 1106  ? MCV 92.6 05/20/2021 0325  ? MCV 91 05/08/2015 1106  ? MCH 28.5 05/20/2021 0325  ? MCHC 30.8 05/20/2021 0325  ? RDW 14.0 05/20/2021 0325  ? RDW 14.1 05/08/2015 1106  ? LYMPHSABS 1.9 08/26/2020 1139  ? LYMPHSABS 1.7 05/08/2015 1106  ? MONOABS 0.6 08/26/2020 1139  ? EOSABS 0.0 08/26/2020 1139  ?  EOSABS 0.0 05/08/2015 1106  ? BASOSABS 0.0 08/26/2020 1139  ? BASOSABS 0.0 05/08/2015 1106  ? ? ?  Latest Ref Rng & Units 05/20/2021  ?  3:25 AM 05/19/2021  ?  4:44 AM 05/18/2021  ?  9:02 AM  ?BMP  ?Glucose 70 - 99 mg/dL 170   205   169    ?BUN 6 - 20 mg/dL 11   13   18     ?Creatinine 0.44 - 1.00 mg/dL 0.99   1.13   1.33    ?Sodium 135 - 145 mmol/L 140   138   138    ?Potassium 3.5 - 5.1 mmol/L 3.7   3.8   3.5    ?Chloride 98 - 111 mmol/L 108   104   96    ?CO2 22 - 32 mmol/L 27   27   30     ?Calcium 8.9 - 10.3 mg/dL 8.1   8.0   10.0    ? ? ?DG Abd 2 Views ? ?Result Date: 05/21/2021 ?CLINICAL DATA:  Abdominal pain.  Evaluate small bowel obstruction. EXAM: ABDOMEN -  2 VIEW COMPARISON:  KUB 05/20/2021, CT abdomen and pelvis 05/18/2021 FINDINGS: Air and stool are seen within the ascending and descending colon. Air is also seen throughout the transverse colon. Air is again seen throughout multiple loops of small bowel measuring up to approximately 3.6 cm. Overall findings are similar to the prior CT which demonstrated areas of dilated bowel and decompressed bowel throughout the small bowel and air and fluid levels throughout the colon compatible with ileus. No portal venous gas or pneumatosis is seen. Evaluation for pneumoperitoneum is limited by supine technique, however none is seen. The lung bases are clear. Moderate bilateral femoroacetabular joint space narrowing. IMPRESSION: Air is seen throughout the small and large bowel with some small bowel loops dilated. This is again compatible with ileus. Note is made of question of partial small bowel obstruction versus ileus on prior 05/18/2021 CT. Electronically Signed   By: Yvonne Kendall M.D.   On: 05/21/2021 10:04   ? ?Disposition Plan & Communication  ?Patient status: Inpatient  ?Admitted From: Home ?Planned disposition location: Home ?Anticipated discharge date: 5/5 pending stable clinical improvement and tolerating diet advancement ? ?Family Communication: none   ?   ?Author: ?Ezekiel Slocumb, DO ?[facility] ?05/22/2021, 2:13 PM  ? ?Available by Epic secure chat 7AM-7PM. ?If 7PM-7AM, please contact night-coverage.  ?[facility] contact information found on [email or web address]. ? ?

## 2021-05-22 NOTE — Progress Notes (Signed)
[facility] ?SURGICAL PROGRESS NOTE (cpt [phone]) ? ?Hospital Day(s): 4.  ? ?Interval History: Patient seen and examined, no acute events or new complaints overnight. Patient reports she is doing better. Abdominal pain resolved. Very mild nausea overnight. No fever, chills, emesis. No new labs or imaging this morning. She is on FLD; tolerating. She has bowel movements recorded.  ? ?Review of Systems:  ?Constitutional: denies fever, chills  ?HEENT: denies cough or congestion  ?Respiratory: denies any shortness of breath  ?Cardiovascular: denies chest pain or palpitations  ?Gastrointestinal: denies abdominal pain, N/V, or diarrhea ? ?Vital signs in last 24 hours: [min-max] current  ?Temp:  [97.7 ?F (36.5 ?C)-98.5 ?F (36.9 ?C)] 97.7 ?F (36.5 ?C) (05/04 0441) ?Pulse Rate:  [71-97] 71 (05/04 0441) ?Resp:  [15-21] 17 (05/04 0441) ?BP: (124-197)/(71-125) 124/71 (05/04 0441) ?SpO2:  [97 %-100 %] 98 % (05/04 0441)     Height: 5' 6"  (167.6 cm) Weight: 116 kg BMI (Calculated): 41.3  ? ?Intake/Output last 2 shifts:  ?05/03 0701 - 05/04 0700 ?In: 720 [P.O.:720] ?Out: -   ? ?Physical Exam:  ?Constitutional: alert, cooperative and no distress  ?HENT: normocephalic without obvious abnormality ?Eyes: PERRL, EOM's grossly intact and symmetric  ?Respiratory: breathing non-labored at rest  ?Cardiovascular: regular rate and sinus rhythm  ?Gastrointestinal: Abdomen is obese, soft, non-tender this morning, she does not appear overtly distended, no rebound/guarding. She is certainly not peritonitic ?Musculoskeletal: no edema or wounds, motor and sensation grossly intact, NT  ? ? ?Labs:  ? ?  Latest Ref Rng & Units 05/20/2021  ?  3:25 AM 05/19/2021  ?  4:44 AM 05/18/2021  ?  9:02 AM  ?CBC  ?WBC 4.0 - 10.5 K/uL 5.2   7.0   8.8    ?Hemoglobin 12.0 - 15.0 g/dL 10.8   11.4   14.0    ?Hematocrit 36.0 - 46.0 % 35.1   37.1   45.3    ?Platelets 150 - 400 K/uL 223   227   325    ? ? ?  Latest Ref Rng & Units 05/20/2021  ?  3:25 AM 05/19/2021   ?  4:44 AM 05/18/2021  ?  9:02 AM  ?CMP  ?Glucose 70 - 99 mg/dL 170   205   169    ?BUN 6 - 20 mg/dL 11   13   18     ?Creatinine 0.44 - 1.00 mg/dL 0.99   1.13   1.33    ?Sodium 135 - 145 mmol/L 140   138   138    ?Potassium 3.5 - 5.1 mmol/L 3.7   3.8   3.5    ?Chloride 98 - 111 mmol/L 108   104   96    ?CO2 22 - 32 mmol/L 27   27   30     ?Calcium 8.9 - 10.3 mg/dL 8.1   8.0   10.0    ?Total Protein 6.5 - 8.1 g/dL   8.8    ?Total Bilirubin 0.3 - 1.2 mg/dL   0.6    ?Alkaline Phos 38 - 126 U/L   89    ?AST 15 - 41 U/L   19    ?ALT 0 - 44 U/L   20    ? ? ? ?Imaging studies: No new pertinent imaging studies ? ? ?Assessment/Plan: (ICD-10's: K56.7) ?56 y.o. female with clinically and radiographically resolving ileus. SBO is possible given surgical history but less likely given persistent bowel function/diarrhea and lack of identifiable transition point  on imaging  ?  ? - Advance to heart healthy/carb modified diet ?           - No indication for surgical interventions at this time ?           - Monitor abdominal examination; on-going bowel function ?           - Pain control prn; antiemetics prn ?           - Continue bowel regimen  ?           - Mobilization as tolerated  ?           - Further management per primary service; we will follow   ?  ?All of the above findings and recommendations were discussed with the patient, and the medical team, and all of patient's questions were answered to her expressed satisfaction. ? ?-- ?Edison Simon, [name] ?[facility] ?05/22/2021, 7:11 AM ?M-F: 7am - 4pm  ?

## 2021-05-22 NOTE — Plan of Care (Signed)
  Problem: Nutrition: Goal: Adequate nutrition will be maintained Outcome: Progressing   Problem: Pain Managment: Goal: General experience of comfort will improve Outcome: Progressing   

## 2021-05-22 NOTE — Plan of Care (Signed)

## 2021-05-23 ENCOUNTER — Other Ambulatory Visit: Payer: Medicare Other

## 2021-05-23 LAB — GLUCOSE, CAPILLARY
Glucose-Capillary: 176 mg/dL — ABNORMAL HIGH (ref 70–99)
Glucose-Capillary: 159 mg/dL — ABNORMAL HIGH (ref 70–99)
Glucose-Capillary: 190 mg/dL — ABNORMAL HIGH (ref 70–99)

## 2021-05-23 LAB — URINALYSIS, ROUTINE W REFLEX MICROSCOPIC
Bilirubin Urine: NEGATIVE
Glucose, UA: NEGATIVE mg/dL
Hgb urine dipstick: NEGATIVE
Ketones, ur: NEGATIVE mg/dL
Nitrite: NEGATIVE
Protein, ur: NEGATIVE mg/dL
Specific Gravity, Urine: 1.013 (ref 1.005–1.030)
pH: 7 (ref 5.0–8.0)

## 2021-05-23 MED ORDER — MECLIZINE HCL 25 MG PO TABS: 25.0000 mg | ORAL_TABLET | Freq: Two times a day (BID) | ORAL | 0 refills | Status: DC | PRN

## 2021-05-23 MED ORDER — ONDANSETRON 8 MG PO TBDP: 8.0000 mg | ORAL_TABLET | Freq: Three times a day (TID) | ORAL | 0 refills | Status: DC | PRN

## 2021-05-23 MED ORDER — MECLIZINE HCL 25 MG PO TABS
25.0000 mg | ORAL_TABLET | Freq: Two times a day (BID) | ORAL | Status: DC | PRN
  Filled 2021-05-23 (×2): qty 1

## 2021-05-23 MED ORDER — BISACODYL 5 MG PO TBEC: 5.0000 mg | DELAYED_RELEASE_TABLET | Freq: Every day | ORAL | 0 refills | Status: DC

## 2021-05-23 MED ORDER — CEPHALEXIN 500 MG PO CAPS
500.0000 mg | ORAL_CAPSULE | Freq: Two times a day (BID) | ORAL | 0 refills | Status: AC
Start: 2021-05-23 — End: 2021-05-28

## 2021-05-23 MED ORDER — SENNOSIDES-DOCUSATE SODIUM 8.6-50 MG PO TABS
1.0000 | ORAL_TABLET | Freq: Two times a day (BID) | ORAL | Status: DC
  Administered 2021-05-23 – 2021-05-24 (×3): 1 via ORAL
  Filled 2021-05-23 (×3): qty 1

## 2021-05-23 MED ORDER — SENNOSIDES-DOCUSATE SODIUM 8.6-50 MG PO TABS: 1.0000 | ORAL_TABLET | Freq: Two times a day (BID) | ORAL | 0 refills | Status: DC

## 2021-05-23 MED ORDER — CEPHALEXIN 500 MG PO CAPS
500.0000 mg | ORAL_CAPSULE | Freq: Two times a day (BID) | ORAL | Status: DC
  Administered 2021-05-23 – 2021-05-24 (×3): 500 mg via ORAL
  Filled 2021-05-23 (×3): qty 1

## 2021-05-23 NOTE — Progress Notes (Addendum)
05/23/2021 ? ?Subjective: ?No acute events overnight.  Patient was advanced to a heart healthy/diabetic diet yesterday.  Patient reports that she had mild nausea after breakfast but otherwise did well after taking Zofran.  Continues to have flatus.  No bowel movement yesterday.  Denies any worsening abdominal pain. ? ?Vital signs: ?Temp:  [98.2 ?F (36.8 ?C)-99.2 ?F (37.3 ?C)] 99.2 ?F (37.3 ?C) (05/05 6301) ?Pulse Rate:  [66-74] 74 (05/05 0856) ?Resp:  [16-18] 16 (05/05 0856) ?BP: (128-148)/(66-87) 148/87 (05/05 0856) ?SpO2:  [95 %-99 %] 95 % (05/05 0856)  ? ?Intake/Output: ?05/04 0701 - 05/05 0700 ?In: 120 [P.O.:120] ?Out: -  ?Last BM Date : 05/21/21 ? ?Physical Exam: ?Constitutional: No acute distress ?Abdomen: Soft, nondistended, currently nontender to palpation. ? ?Labs:  ?No results for input(s): WBC, HGB, HCT, PLT in the last 72 hours. ?No results for input(s): NA, K, CL, CO2, GLUCOSE, BUN, CREATININE, CALCIUM in the last 72 hours. ? ?Invalid input(s): MAGNESIUM ?No results for input(s): LABPROT, INR in the last 72 hours. ? ?Imaging: ?No results found. ? ?Assessment/Plan: ?This is a 56 y.o. female with ileus versus SBO, now resolved. ? ?- Discussed with the patient that given her constipation issues, it is not surprising that there was 1 day without a bowel movement.  I think is appropriate to continue her bowel regimen with Dulcolax and MiraLAX daily and even upon discharge she should continue taking these 2 on a daily basis. ?- Continue her diet today. ?- Should be okay to discharge home later today as long as she is feeling well.  Discussed with her that if she were to come back quickly like she did last month, that being then the third time of similar episodes, we will have to discuss more seriously the need for surgery to evaluate her abdomen to see if there is any true adhesions causing her issues. ?- For now, follow-up with me in about 2 weeks so that we can evaluate her and potentially order a small bowel  follow-through contrast study. ? ? ?I spent 35 minutes dedicated to the care of this patient on the date of this encounter to include pre-visit review of records, face-to-face time with the patient discussing diagnosis and management, and any post-visit coordination of care. ? ?Melvyn Neth, MD ?[facility]  ?

## 2021-05-23 NOTE — Discharge Summary (Addendum)
?Physician Discharge Summary ?  ?Patient: [name] MRN: [id number] DOB: 23-Feb-1965  ?Admit date:     05/18/2021  ?Discharge date: 05/24/2021  ?Discharge Physician: Ezekiel Slocumb  ? ?PCP: Delsa Grana, [name]  ? ?Recommendations at discharge:  ? ?Follow-up with general surgery, Dr. Hampton Abbot in about 2 weeks ?Follow-up with primary care in 1 week ?Repeat BMP, CBC, Mg level in 1 week ?Follow-up on blood pressure.  Clonidine was held on discharge to avoid hypotension.  BPs were controlled with this held during admission, may need to be resumed. ?Follow-up on resolution of UTI.  Discharged on Keflex. ? ?Discharge Diagnoses: ?Principal Problem: ?  Small bowel obstruction ([place]) ?Active Problems: ?  Seizure ([place]) ?  OSA (obstructive sleep apnea) ?  GERD without esophagitis ?  Essential hypertension, benign ?  Chronic diastolic CHF (congestive heart failure) ([place]) ?  Type 2 diabetes mellitus with chronic kidney disease, with long-term current use of insulin ([place]) ?  Obesity, Class III, BMI 40-49.9 (morbid obesity) ([place]) ? ? ?Hospital Course: ?[name] is a 56 y.o. female with a PMH significant for CVA with left-sided hemiparesis, post incisional hernia repair, morbid obesity (BMI 41.28), diabetes mellitus, COPD, chronic diastolic dysfunction CHF, seizures and sleep apnea . ?  ?They presented from home to the ED on 05/18/2021 with severe abdominal pain x 1 day and watery Bms x3 days. She was recently discharged form hospital for similar. ?  ?In the ED, it was found that they had stable vital signs with exception of tachycardia up to 124.  ?Significant findings included Na+ 138, K+ 3.5, Cl 96, bicarb 30, glucose 169, BUN 18, Cr 1.3, Ca++ 10, WBC 8.8, hgb 14, platelets 325. ?CT abdomen/pelvis-  fluid-filled dilated stomach is associated with dilated fluid-filled small bowel loops in the pelvis with decompressed distal ileum. A discrete or abrupt small bowel transition zone ?cannot be identified, but given  the distal and terminal ileum is completely decompressed, component of small-bowel obstruction cannot be excluded. Despite the decompressed distal small bowel, the colon ?is also diffusely fluid-filled and distended throughout from the cecum to the rectum. Fluid in the rectum suggests diarrheal illness and ileus would be a consideration. Fluid-filled and distended appendix up to 8 mm diameter although no periappendiceal edema or inflammation. This is likely related to the colonic distension Bilateral nonobstructing renal stones. Stable 2 cm right adrenal adenoma. No followup recommended ?  ?They were treated with IV fluids, NPO, analgesia, antiemetics, PPI, and home medications. General surgery was consulted.  ?Patient was admitted to medicine service for further workup and management of abdominal pain as outlined in detail below. ?  ?5/1- NG tube removed spontaneously ?  ?05/4>>5/23 - stable, improved.  Passing gas, but no BM overnight.  Cleared by surgery for discharge, tolerating diet. ? ?5/5 PM Addendum --- pt not comfortable discharging without another bowel movement, continue laxative regimen today and overnight, plan for d/c tomorrow AM.  Discharge today cancelled. ? ?5/6 - pt cleared by surgery for discharge today and pt is agreeable.  Wants to try enema/s at home and will have close follow up with surgery for outpatient evaluation. ? ?Assessment and Plan: ?Abdominal pain- due to SBO / ileus. Superimposed on chronic constipation.  Clinically improved with conservative management. ?H/o remote abdominal hernia repair and prior SBOs.  ?Pt reported some mix of liquid and formed stool on Tues but none since.  Is passing gas.  Tolerating diet with some nausea no vomiting.  \ ?- General  surgery consulted,  appreciate recs ?            - cleared for d/c and follow up in 2 weeks for small bowel follow through study ?- Off IV hydration ?- analgesia PRN ?- anti-emetics PRN ?- strict I/Os ?- bowel regimen per orders,  further enemas if needed ?  ?Seizure disorder ([place])- Stable ?- resumed on PO home medications (IV sub'd when NPO) ?- Seizure precautions ?  ?OSA- Stable ?- Continue CPAP at bedtime ?  ?GERD without esophagitis- Stable ?- Continue PPI ?  ?Essential hypertension ?- transitioning back to home PO antihypertensives ?  ?Chronic diastolic CHF (congestive heart failure) ([place]) ?Stable and not acutely exacerbated ?- restarted home meds ?  ?Type 2 diabetes mellitus with chronic kidney disease, with long-term current use of insulin ([place]) ?Patient has a history of diabetes mellitus with stage IIIa chronic kidney disease. ?Check blood sugars with meals, sSSI ?  ?  ?Morbid obesity Body mass index is 41.28 kg/m?[name] ?Complicates overall care and prognosis.  Recommend lifestyle modifications including physical activity and diet for weight loss and overall long-term health. ?  ? ? ?  ? ? ?Consultants: General surgery ?Procedures performed: none  ?Disposition: Home health ?Diet recommendation:  ?Discharge Diet Orders (From admission, onward)  ? ?  Start     Ordered  ? 05/23/21 0000  Diet - low sodium heart healthy       ? 05/23/21 1412  ? ?  ?  ? ?  ? ?Cardiac and Carb modified diet ?DISCHARGE MEDICATION: ?Allergies as of 05/24/2021   ? ?   Reactions  ? Codeine Swelling, Other (See Comments)  ? Swelling and burning of mouth (inside)  ? Lactose Intolerance (gi) Nausea And Vomiting  ? ?  ? ?  ?Medication List  ?  ? ?STOP taking these medications   ? ?cloNIDine 0.1 MG tablet ?Commonly known as: Catapres ?  ? ?  ? ?TAKE these medications   ? ?albuterol 1.25 MG/3ML nebulizer solution ?Commonly known as: ACCUNEB ?Inhale 1 ampule into the lungs every 6 (six) hours as needed for wheezing or shortness of breath. ?  ?albuterol 108 (90 Base) MCG/ACT inhaler ?Commonly known as: ProAir HFA ?INHALE 2 PUFFS BY MOUTH EVERY 6 HOURS AS NEEDED FOR WHEEZING OR SHORTNESS OF BREATH ?  ?amitriptyline 25 MG tablet ?Commonly known as: ELAVIL ?Take 2-3 tablets  (50-75 mg total) by mouth at bedtime. ?  ?baclofen 10 MG tablet ?Commonly known as: LIORESAL ?TAKE 1 TABLET BY MOUTH THREE TIMES A DAY ?  ?bisacodyl 5 MG EC tablet ?Commonly known as: DULCOLAX ?Take 1 tablet (5 mg total) by mouth daily. ?  ?budesonide 0.5 MG/2ML nebulizer solution ?Commonly known as: PULMICORT ?Inhale 0.5 mg into the lungs 2 (two) times daily. ?  ?butalbital-acetaminophen-caffeine 50-325-40 MG tablet ?Commonly known as: FIORICET ?Take 1-2 tablets by mouth every 6 (six) hours as needed for headache. ?  ?carvedilol 25 MG tablet ?Commonly known as: COREG ?Take 25 mg by mouth 2 (two) times daily. ?  ?cephALEXin 500 MG capsule ?Commonly known as: KEFLEX ?Take 1 capsule (500 mg total) by mouth every 12 (twelve) hours for 5 days. ?  ?cholecalciferol 25 MCG (1000 UNIT) tablet ?Commonly known as: VITAMIN D3 ?Take 1,000 Units by mouth daily. ?  ?ciclopirox 8 % solution ?Commonly known as: PENLAC ?Apply topically at bedtime. ?  ?clopidogrel 75 MG tablet ?Commonly known as: PLAVIX ?Take 1 tablet (75 mg total) by mouth daily. ?  ?cycloSPORINE 0.05 %  ophthalmic emulsion ?Commonly known as: RESTASIS ?Place 2 drops into both eyes 2 (two) times daily. ?  ?DULoxetine 60 MG capsule ?Commonly known as: Cymbalta ?Take 1 capsule (60 mg total) by mouth daily. ?  ?EPINEPHrine 0.3 mg/0.3 mL Soaj injection ?Commonly known as: EPI-PEN ?Inject 0.3 mg into the muscle as needed for anaphylaxis. ?  ?ezetimibe 10 MG tablet ?Commonly known as: Zetia ?Take 1 tablet (10 mg total) by mouth daily. ?  ?Farxiga 10 MG Tabs tablet ?Generic drug: dapagliflozin propanediol ?TAKE 1 TABLET BY MOUTH EVERY DAY ?  ?fluticasone 50 MCG/ACT nasal spray ?Commonly known as: FLONASE ?Place 2 sprays into both nostrils daily. ?  ?FreeStyle Libre 2 Sensor Misc ?Change every 14 days ?  ?gabapentin 600 MG tablet ?Commonly known as: Neurontin ?600 mg qAM, 600 mg qPM, 1200 mg QHS ?  ?HumaLOG KwikPen 200 UNIT/ML KwikPen ?Generic drug: insulin lispro ?Inject up  to 160 units daily in divided doses as directed ?  ?hydrALAZINE 50 MG tablet ?Commonly known as: APRESOLINE ?Take 1 tablet by mouth 3 (three) times daily. ?  ?levETIRAcetam 500 MG tablet ?Commonly known as:

## 2021-05-24 ENCOUNTER — Inpatient Hospital Stay: Payer: Medicare Other

## 2021-05-24 LAB — CBC
HCT: 36.3 % (ref 36.0–46.0)
Hemoglobin: 11.3 g/dL — ABNORMAL LOW (ref 12.0–15.0)
MCH: 28.6 pg (ref 26.0–34.0)
MCHC: 31.1 g/dL (ref 30.0–36.0)
MCV: 91.9 fL (ref 80.0–100.0)
Platelets: 287 10*3/uL (ref 150–400)
RBC: 3.95 MIL/uL (ref 3.87–5.11)
RDW: 13.7 % (ref 11.5–15.5)
WBC: 7 10*3/uL (ref 4.0–10.5)
nRBC: 0 % (ref 0.0–0.2)

## 2021-05-24 LAB — COMPREHENSIVE METABOLIC PANEL
ALT: 20 U/L (ref 0–44)
AST: 15 U/L (ref 15–41)
Albumin: 3.4 g/dL — ABNORMAL LOW (ref 3.5–5.0)
Alkaline Phosphatase: 71 U/L (ref 38–126)
Anion gap: 7 (ref 5–15)
BUN: 15 mg/dL (ref 6–20)
CO2: 27 mmol/L (ref 22–32)
Calcium: 9 mg/dL (ref 8.9–10.3)
Chloride: 102 mmol/L (ref 98–111)
Creatinine, Ser: 1.09 mg/dL — ABNORMAL HIGH (ref 0.44–1.00)
GFR, Estimated: 60 mL/min — ABNORMAL LOW (ref >60)
Glucose, Bld: 195 mg/dL — ABNORMAL HIGH (ref 70–99)
Potassium: 4.1 mmol/L (ref 3.5–5.1)
Sodium: 136 mmol/L (ref 135–145)
Total Bilirubin: 0.3 mg/dL (ref 0.3–1.2)
Total Protein: 7.3 g/dL (ref 6.5–8.1)

## 2021-05-24 LAB — GLUCOSE, CAPILLARY
Glucose-Capillary: 192 mg/dL — ABNORMAL HIGH (ref 70–99)
Glucose-Capillary: 208 mg/dL — ABNORMAL HIGH (ref 70–99)
Glucose-Capillary: 198 mg/dL — ABNORMAL HIGH (ref 70–99)

## 2021-05-24 LAB — MAGNESIUM: Magnesium: 2 mg/dL (ref 1.7–2.4)

## 2021-05-24 LAB — PHOSPHORUS: Phosphorus: 3.2 mg/dL (ref 2.5–4.6)

## 2021-05-24 MED ORDER — FLEET ENEMA 7-19 GM/118ML RE ENEM
1.0000 | ENEMA | Freq: Every day | RECTAL | 2 refills | Status: DC | PRN
Start: 2021-05-24 — End: 2022-07-22

## 2021-05-24 NOTE — Progress Notes (Addendum)
CC: SBO ?Subjective: ?[name] is a 56 year old well-known to [name] with history of recurrent small bowel obstructions.  I have seen her in the past Dr. Peyton Najjar has seen her in the past and now Dr. Hampton Abbot was the last 1 to see her.  She does have some intermittent abdominal pain that she points to the middle of the abdominal wall.  She is passing gas and is constipated.  No nausea no vomiting her main complaint today is dizziness.  Please note that I again personally reviewed operative report from the last abdominal operation in 2013 or Dr. Donne Hazel placed a Ventralight 15 cm round mesh with secure strap tacker on Prolene sutures. ?I have seen her in the past and I have performed a CT enterography without any other major abnormalities.  Please note that I have personally review the previous CT scans as well as a KUB that I ordered today.  KUB today shows some air within the small bowel which is not normal there is no evidence of bowel obstruction. ?I repeated lab work today which is unremarkable. ? ?Objective: ?Vital signs in last 24 hours: ?Temp:  [97.9 ?F (36.6 ?C)-98.9 ?F (37.2 ?C)] 97.9 ?F (36.6 ?C) (05/06 0749) ?Pulse Rate:  [65-73] 66 (05/06 0749) ?Resp:  [16-18] 18 (05/06 0749) ?BP: (122-150)/(55-83) 122/72 (05/06 0749) ?SpO2:  [96 %-100 %] 97 % (05/06 0749) ?Last BM Date : 05/21/21 ? ?Intake/Output from previous day: ?No intake/output data recorded. ?Intake/Output this shift: ?Total I/O ?In: 120 [P.O.:120] ?Out: -  ? ?Physical exam: ?Constitutional: alert, cooperative and no distress  ?HENT: normocephalic without obvious abnormality ?Eyes:  EOM's grossly intact and symmetric  ?Respiratory: breathing non-labored at rest  ?Cardiovascular: regular rate and sinus rhythm  ?Gastrointestinal: Abdomen is obese, soft, non-tender this morning, she does not appear overtly distended, no rebound/guarding. She is certainly not peritonitic. Prior laparoscopic scars. She point at epigastric area as tender prio to me coming in  the room ?Musculoskeletal: no edema or wounds, motor and sensation grossly intact, NT  ? ? ?Lab Results: ?CBC  ?Recent Labs  ?  05/24/21 ?1223  ?WBC 7.0  ?HGB 11.3*  ?HCT 36.3  ?PLT 287  ? ?BMET ?Recent Labs  ?  05/24/21 ?1223  ?NA 136  ?K 4.1  ?CL 102  ?CO2 27  ?GLUCOSE 195*  ?BUN 15  ?CREATININE 1.09*  ?CALCIUM 9.0  ? ?PT/INR ?No results for input(s): LABPROT, INR in the last 72 hours. ?ABG ?No results for input(s): PHART, HCO3 in the last 72 hours. ? ?Invalid input(s): PCO2, PO2 ? ?Studies/Results: ?DG ABD ACUTE 2+V W 1V CHEST ? ?Result Date: 05/24/2021 ?CLINICAL DATA:  Follow-up small bowel obstruction. EXAM: DG ABDOMEN ACUTE WITH 1 VIEW CHEST COMPARISON:  05/21/2021 FINDINGS: Nondilated small bowel loops are seen containing air-fluid levels. Air-fluid levels are also seen in the colon. This shows no significant change since prior study. No evidence of free intraperitoneal air. Heart size is normal.  Both lungs are clear. IMPRESSION: Nonspecific, nonobstructive bowel gas pattern. No active cardiopulmonary disease. Electronically Signed   By: Marlaine Hind M.D.   On: 05/24/2021 12:07   ? ?Anti-infectives: ?Anti-infectives (From admission, onward)  ? ? Start     Dose/Rate Route Frequency Ordered Stop  ? 05/23/21 1445  cephALEXin (KEFLEX) capsule 500 mg       ? 500 mg Oral Every 12 hours 05/23/21 1345 05/26/21 0959  ? 05/23/21 0000  cephALEXin (KEFLEX) 500 MG capsule       ? 500 mg Oral  Every 12 hours 05/23/21 1412 05/28/21 2359  ? ?  ? ? ?Assessment/Plan: ?Resolving partial small bowel obstruction.  Given frequent episode of a small bowel obstruction and not good etiology I do think that further outpatient work-up is warranted.  Specifically it might not be a bad idea to obtain MRI enterography as it may provide more information than a gi series..  ? She is tolerating PO and may go home ?We may even entertain potential diagnostic laparoscopy as an outpatient at some point in time once all her medical conditions are  optimized.  At this point there is absolutely no need for emergent surgical intervention.  She is passing gas and she is feeling better.  Please note that I spent 50 minutes in this encounter including coordination of her care, personally reviewing imaging studies, placing orders and performing appropriate documentation ? ? ?Caroleen Hamman, MD, FACS ? ?05/24/2021 ? ? ? ?  ?

## 2021-05-26 ENCOUNTER — Inpatient Hospital Stay: Payer: Medicare Other | Admitting: Family Medicine

## 2021-05-26 ENCOUNTER — Telehealth: Payer: Self-pay

## 2021-05-26 NOTE — Telephone Encounter (Signed)
Transition Care Management Follow-up Telephone Call ?Date of discharge and from where: 05/24/21 [facility] ?How have you been since you were released from the hospital? Pt states she is doing okay ?Any questions or concerns? No ? ?Items Reviewed: ?Did the pt receive and understand the discharge instructions provided? Yes  ?Medications obtained and verified? Yes  ?Other? No  ?Any new allergies since your discharge? No  ?Dietary orders reviewed? Yes ?Do you have support at home? Yes  ? ?Home Care and Equipment/Supplies: ?Were home health services ordered? yes ?If so, what is the name of the agency? [facility]  ?Has the agency set up a time to come to the patient's home? no ?Were any new equipment or medical supplies ordered?  No ? ?Functional Questionnaire: (I = Independent and D = Dependent) ?ADLs: I ? ?Bathing/Dressing- I ? ?Meal Prep- I ? ?Eating- I ? ?Maintaining continence- I ? ?Transferring/Ambulation- I ? ?Managing Meds- I ? ?Follow up appointments reviewed: ? ?PCP Hospital f/u appt confirmed? Yes  Scheduled to see Delsa Grana PAC on 06/02/21 @ 10:40. ?[facility] f/u appt confirmed? Yes  Scheduled to see Dr. Hampton Abbot on 06/06/21. ?Are transportation arrangements needed? No  ?If their condition worsens, is the pt aware to call PCP or go to the Emergency Dept.? Yes ?Was the patient provided with contact information for the PCP's office or ED? Yes ?Was to pt encouraged to call back with questions or concerns? Yes ? ?

## 2021-05-27 ENCOUNTER — Telehealth: Payer: Self-pay | Admitting: Family Medicine

## 2021-05-27 NOTE — Telephone Encounter (Signed)
Copied from [place] [id number]. Topic: Quick Communication - Home Health Verbal Orders ?>> May 27, 2021  3:53 PM Leward Quan A wrote: ?Caller/Agency: Elder Cyphers // [facility]  ?Callback Number:  [phone] ok to LM  ?Requesting OT/PT/Skilled Nursing/Social Work/Speech Therapy: PT  ?Frequency: resume PT  2w2 and 1w1 ?

## 2021-05-27 NOTE — Telephone Encounter (Signed)
Copied from [place]. Topic: General - Other ?>> May 27, 2021  3:57 PM Leward Quan A wrote: ?Reason for CRM: [name] with [name] called in to request verbal orders to resume care on patient and wanted to inform provider that patient BP was 182/102 upon her visit today with no symptoms patient was fine. Just wanted to inform provider of the numbers. Jeani Hawking can be reached at Ph#  [phone] ?

## 2021-05-28 ENCOUNTER — Ambulatory Visit: Payer: Self-pay | Admitting: *Deleted

## 2021-05-28 ENCOUNTER — Telehealth: Payer: Self-pay

## 2021-05-28 NOTE — Telephone Encounter (Signed)
Copied from [place]. Topic: General - Other ?>> May 27, 2021  3:57 PM Leward Quan A wrote: ?Reason for CRM: Elder Cyphers with [facility] called in to request verbal orders to resume care on patient and wanted to inform provider that patient BP was 182/102 upon her visit today with no symptoms patient was fine. Just wanted to inform provider of the numbers. Jeani Hawking can be reached at Ph#  [phone] ? ?Attempted to call patient regarding elevated BP reading- left message to call office ?Attempted to call Elder Cyphers - left message to call office regarding BP reading ?

## 2021-05-28 NOTE — Telephone Encounter (Signed)
Copied from [place] [phone]. Topic: General - Other >> May 28, 2021  2:50 PM Pawlus, Brayton Layman A wrote: Reason for CRM: Pt was advised to call in and leave a message for Delsa Grana with her BP reading, pt already spoke to NT today 5/10. Pt stated it was 151 / 85 FYI

## 2021-05-28 NOTE — Telephone Encounter (Signed)
See triage encounter (call back que) ?

## 2021-05-28 NOTE — Telephone Encounter (Signed)
?  Chief Complaint: elevated BP reading ?Symptoms: none ?Frequency: patient report her numbers in the morning have been high- even in hospital- but they go down after her morning medication ?Pertinent Negatives: Patient denies headache, chest pain, blurred vision, difficulty breathing, weakness ?Disposition: [] ED /[] Urgent Care (no appt availability in office) / [] Appointment(In office/virtual)/ []  [place] [facility]/ [] Home Care/ [] Refused Recommended Disposition /[] [place] [facility]/ [x]  Follow-up with PCP ?Additional Notes: Patient has appointment on Monday- she wants to keep that- patient advised to call with her afternoon reading for provider. Patient will continue to keep daily record of BP- she has been advised ED for high readings with symptoms.   ? ? ?Reason for Disposition ? Systolic BP  >= 832 OR Diastolic >= 919 ? ?Answer Assessment - Initial Assessment Questions ?1. BLOOD PRESSURE: "What is the blood pressure?" "Did you take at least two measurements 5 minutes apart?" ?    186/90 ?2. ONSET: "When did you take your blood pressure?" ?    9:00 ?3. HOW: "How did you obtain the blood pressure?" (e.g., visiting nurse, automatic home BP monitor) ?    Automatic cuff ?4. HISTORY: "Do you have a history of high blood pressure?" ?    yes ?5. MEDICATIONS: "Are you taking any medications for blood pressure?" "Have you missed any doses recently?" ?    Morning meds at 8-9 am- no missed medication, patient reports it comes down in the afternoon-average 140/80 ?6. OTHER SYMPTOMS: "Do you have any symptoms?" (e.g., headache, chest pain, blurred vision, difficulty breathing, weakness) ?    No symptoms- patient states she will have symptoms when her BP is high and she has not been having symptoms ?7. PREGNANCY: "Is there any chance you are pregnant?" "When was your last menstrual period?" ? ?Protocols used: Blood Pressure - High-A-AH ? ?

## 2021-05-29 NOTE — Telephone Encounter (Signed)
completed

## 2021-06-02 ENCOUNTER — Ambulatory Visit (INDEPENDENT_AMBULATORY_CARE_PROVIDER_SITE_OTHER): Payer: Medicare Other | Admitting: Family Medicine

## 2021-06-02 ENCOUNTER — Encounter: Payer: Self-pay | Admitting: Family Medicine

## 2021-06-02 VITALS — BP 128/70 | HR 76 | Resp 16 | Ht 66.0 in | Wt 256.0 lb

## 2021-06-02 DIAGNOSIS — Z794 Long term (current) use of insulin: Secondary | ICD-10-CM

## 2021-06-02 DIAGNOSIS — M25532 Pain in left wrist: Secondary | ICD-10-CM

## 2021-06-02 DIAGNOSIS — Z5181 Encounter for therapeutic drug level monitoring: Secondary | ICD-10-CM

## 2021-06-02 DIAGNOSIS — R42 Dizziness and giddiness: Secondary | ICD-10-CM

## 2021-06-02 DIAGNOSIS — N179 Acute kidney failure, unspecified: Secondary | ICD-10-CM

## 2021-06-02 DIAGNOSIS — N390 Urinary tract infection, site not specified: Secondary | ICD-10-CM

## 2021-06-02 DIAGNOSIS — K56609 Unspecified intestinal obstruction, unspecified as to partial versus complete obstruction: Secondary | ICD-10-CM | POA: Diagnosis not present

## 2021-06-02 DIAGNOSIS — Z09 Encounter for follow-up examination after completed treatment for conditions other than malignant neoplasm: Secondary | ICD-10-CM

## 2021-06-02 DIAGNOSIS — N1831 Chronic kidney disease, stage 3a: Secondary | ICD-10-CM

## 2021-06-02 DIAGNOSIS — I951 Orthostatic hypotension: Secondary | ICD-10-CM

## 2021-06-02 DIAGNOSIS — I69359 Hemiplegia and hemiparesis following cerebral infarction affecting unspecified side: Secondary | ICD-10-CM

## 2021-06-02 DIAGNOSIS — E1122 Type 2 diabetes mellitus with diabetic chronic kidney disease: Secondary | ICD-10-CM

## 2021-06-02 DIAGNOSIS — E785 Hyperlipidemia, unspecified: Secondary | ICD-10-CM

## 2021-06-02 DIAGNOSIS — I5032 Chronic diastolic (congestive) heart failure: Secondary | ICD-10-CM

## 2021-06-02 LAB — BASIC METABOLIC PANEL WITH GFR
BUN/Creatinine Ratio: 16 (calc) (ref 6–22)
BUN: 21 mg/dL (ref 7–25)
CO2: 31 mmol/L (ref 20–32)
Calcium: 9.8 mg/dL (ref 8.6–10.4)
Chloride: 105 mmol/L (ref 98–110)
Creat: 1.28 mg/dL — ABNORMAL HIGH (ref 0.50–1.03)
Glucose, Bld: 63 mg/dL — ABNORMAL LOW (ref 65–99)
Potassium: 5.4 mmol/L — ABNORMAL HIGH (ref 3.5–5.3)
Sodium: 144 mmol/L (ref 135–146)
eGFR: 49 mL/min/{1.73_m2} — ABNORMAL LOW (ref >=60)

## 2021-06-02 LAB — CBC WITH DIFFERENTIAL/PLATELET
Absolute Monocytes: 683 cells/uL (ref 200–950)
Basophils Absolute: 30 cells/uL (ref 0–200)
Basophils Relative: 0.4 %
Eosinophils Absolute: 38 cells/uL (ref 15–500)
Eosinophils Relative: 0.5 %
HCT: 38.6 % (ref 35.0–45.0)
Hemoglobin: 12.4 g/dL (ref 11.7–15.5)
Lymphs Abs: 2453 cells/uL (ref 850–3900)
MCH: 29 pg (ref 27.0–33.0)
MCHC: 32.1 g/dL (ref 32.0–36.0)
MCV: 90.2 fL (ref 80.0–100.0)
MPV: 11.4 fL (ref 7.5–12.5)
Monocytes Relative: 9.1 %
Neutro Abs: 4298 cells/uL (ref 1500–7800)
Neutrophils Relative %: 57.3 %
Platelets: 319 10*3/uL (ref 140–400)
RBC: 4.28 10*6/uL (ref 3.80–5.10)
RDW: 13.4 % (ref 11.0–15.0)
Total Lymphocyte: 32.7 %
WBC: 7.5 10*3/uL (ref 3.8–10.8)

## 2021-06-02 LAB — MAGNESIUM: Magnesium: 2.1 mg/dL (ref 1.5–2.5)

## 2021-06-02 LAB — LIPID PANEL
Cholesterol: 128 mg/dL (ref <200)
HDL: 41 mg/dL — ABNORMAL LOW (ref >=50)
LDL Cholesterol (Calc): 67 mg/dL (calc)
Non-HDL Cholesterol (Calc): 87 mg/dL (calc) (ref <130)
Total CHOL/HDL Ratio: 3.1 (calc) (ref <5.0)
Triglycerides: 116 mg/dL (ref <150)

## 2021-06-02 NOTE — Progress Notes (Signed)
? ?Name: [name]   MRN: [id number]    DOB: Jul 03, 1965   Date:06/02/2021 ? ?     Progress Note ? ?Chief Complaint  ?Patient presents with  ? Hospitalization Follow-up  ? ? ? ?Subjective:  ? ?[name] is a 56 y.o. female, presents to clinic for  ? ?Here for hospital follow up/transition of care. ? ?Admit date: 4/30 ?Discharge date: 05/24/21 ?Transition of care was initiated previously by [facility] on 5/8 and med changes, diagnosis, specialist follow ups and pts symptoms and condition were all reviewed. ? ?Pt was admitted for small bowel obstruction ?New medications started per hospitalization include abx for UTI, zofran for nausea, laxative.  BP meds where changed as well due to hypotension ?Labs due today are cbc bmp and mag ?Pt feels tired, still some pain  ?Having dizzy episodes - seems to last for 30+ min at a time.  Needs refill on meclizine - began while in the hospital, doesn't seem to be related to when she gets up, can occur other times as well. ?She also endorses generalized tiredness/fatigue- needing walker all the time now to avoid falling.  ? ?UTI in hospital - she has been getting UTI's often - on farxiga - managed by Dr. Dwyane Dee. ?She finished keflex and denies any urinary sx.  Unable to give urine sample today. ? ?She has surgical f/up end of week.  She is having loose stools- watery, still on stool softeners. ?No abd distention or increased pain. ? ?Pain management from Dr. Holley Raring ?Doing HH-  physical therapy - tjhey have called about BP - it was high last week, but was low in the hospital.  Last PT visit at home BP was 150's ?BP Readings from Last 3 Encounters:  ?06/02/21 128/70  ?05/24/21 (!) 123/55  ?05/13/21 118/74  ?Today BP is well controlled and at goal.   ?On benicar, hydralazine 50 mg TID, carvedilol and spironolactone  ?Clonidine d/c in hospital  ? ?She complains of severe left wrist pain that began in hospital - sore without injury - she has wrapped in ace wrap.  She  reported this while in the hospital but no imaging was done - they said it could have been from the IV which was placed more proximal on same arm.  She denies redness swelling or any increased stress/recurrent/repetative activity. ? ?Current Outpatient Medications:  ?  albuterol (ACCUNEB) 1.25 MG/3ML nebulizer solution, Inhale 1 ampule into the lungs every 6 (six) hours as needed for wheezing or shortness of breath., Disp: , Rfl:  ?  albuterol (PROAIR HFA) 108 (90 Base) MCG/ACT inhaler, INHALE 2 PUFFS BY MOUTH EVERY 6 HOURS AS NEEDED FOR WHEEZING OR SHORTNESS OF BREATH, Disp: 3 Inhaler, Rfl: 3 ?  amitriptyline (ELAVIL) 25 MG tablet, Take 2-3 tablets (50-75 mg total) by mouth at bedtime., Disp: 90 tablet, Rfl: 5 ?  baclofen (LIORESAL) 10 MG tablet, TAKE 1 TABLET BY MOUTH THREE TIMES A DAY, Disp: 180 tablet, Rfl: 0 ?  bisacodyl (DULCOLAX) 5 MG EC tablet, Take 1 tablet (5 mg total) by mouth daily., Disp: 30 tablet, Rfl: 0 ?  budesonide (PULMICORT) 0.5 MG/2ML nebulizer solution, Inhale 0.5 mg into the lungs 2 (two) times daily., Disp: , Rfl:  ?  butalbital-acetaminophen-caffeine (FIORICET) 50-325-40 MG tablet, Take 1-2 tablets by mouth every 6 (six) hours as needed for headache., Disp: 30 tablet, Rfl: 5 ?  carvedilol (COREG) 25 MG tablet, Take 25 mg by mouth 2 (two) times daily., Disp: , Rfl:  ?  cholecalciferol (VITAMIN D3) 25 MCG (1000 UNIT) tablet, Take 1,000 Units by mouth daily., Disp: , Rfl:  ?  ciclopirox (PENLAC) 8 % solution, Apply topically at bedtime., Disp: , Rfl:  ?  clopidogrel (PLAVIX) 75 MG tablet, Take 1 tablet (75 mg total) by mouth daily., Disp: 90 tablet, Rfl: 0 ?  Continuous Blood Gluc Sensor (FREESTYLE LIBRE 2 SENSOR) MISC, Change every 14 days, Disp: 2 each, Rfl: 3 ?  cycloSPORINE (RESTASIS) 0.05 % ophthalmic emulsion, Place 2 drops into both eyes 2 (two) times daily., Disp: , Rfl:  ?  Dulaglutide (TRULICITY) 4.5 VH/8.4ON SOPN, Inject 4.5 mg as directed once a week., Disp: 2 mL, Rfl: 3 ?   DULoxetine (CYMBALTA) 60 MG capsule, Take 1 capsule (60 mg total) by mouth daily., Disp: 90 capsule, Rfl: 1 ?  EPINEPHrine 0.3 mg/0.3 mL IJ SOAJ injection, Inject 0.3 mg into the muscle as needed for anaphylaxis., Disp: , Rfl:  ?  ezetimibe (ZETIA) 10 MG tablet, Take 1 tablet (10 mg total) by mouth daily., Disp: 90 tablet, Rfl: 0 ?  FARXIGA 10 MG TABS tablet, TAKE 1 TABLET BY MOUTH EVERY DAY, Disp: 90 tablet, Rfl: 1 ?  fluticasone (FLONASE) 50 MCG/ACT nasal spray, Place 2 sprays into both nostrils daily., Disp: 16 g, Rfl: 3 ?  gabapentin (NEURONTIN) 600 MG tablet, 600 mg qAM, 600 mg qPM, 1200 mg QHS, Disp: 120 tablet, Rfl: 5 ?  hydrALAZINE (APRESOLINE) 50 MG tablet, Take 1 tablet by mouth 3 (three) times daily., Disp: , Rfl:  ?  Insulin Disposable Pump (OMNIPOD DASH PODS, GEN 4,) MISC, Change pod once daily, Disp: 30 each, Rfl: 2 ?  insulin lispro (HUMALOG KWIKPEN) 200 UNIT/ML KwikPen, Inject up to 160 units daily in divided doses as directed, Disp: 6 mL, Rfl: 2 ?  Insulin Pen Needle (PEN NEEDLES) 32G X 4 MM MISC, Use with Humalog to fill pump daily, Disp: 100 each, Rfl: 2 ?  levETIRAcetam (KEPPRA) 500 MG tablet, Take 500-750 mg by mouth See admin instructions. Take 1 tablet (568m) by mouth every morning and take 1? tablets (7542m by mouth every night, Disp: , Rfl:  ?  meclizine (ANTIVERT) 25 MG tablet, Take 1 tablet (25 mg total) by mouth 2 (two) times daily as needed for dizziness., Disp: 30 tablet, Rfl: 0 ?  mirabegron ER (MYRBETRIQ) 50 MG TB24 tablet, Take 1 tablet (50 mg total) by mouth daily., Disp: 90 tablet, Rfl: 1 ?  nitroGLYCERIN (NITROSTAT) 0.4 MG SL tablet, Place 0.4 mg under the tongue every 5 (five) minutes as needed for chest pain., Disp: , Rfl:  ?  olmesartan-hydrochlorothiazide (BENICAR HCT) 40-25 MG tablet, TAKE 1 TABLET BY MOUTH EVERY DAY, Disp: 90 tablet, Rfl: 1 ?  ondansetron (ZOFRAN-ODT) 8 MG disintegrating tablet, Take 1 tablet (8 mg total) by mouth every 8 (eight) hours as needed for nausea  or vomiting., Disp: 20 tablet, Rfl: 0 ?  pantoprazole (PROTONIX) 20 MG tablet, Take 1 tablet (20 mg total) by mouth daily., Disp: 90 tablet, Rfl: 1 ?  polyethylene glycol (MIRALAX / GLYCOLAX) 17 g packet, Take 17 g by mouth daily as needed for mild constipation., Disp: 100 each, Rfl: 3 ?  potassium chloride SA (KLOR-CON M20) 20 MEQ tablet, You have not needed potassium while in the hospital.  Hold until outpatient followup. (Patient taking differently: Take 20 mEq by mouth 2 (two) times daily.), Disp: 60 tablet, Rfl: 5 ?  rosuvastatin (CRESTOR) 10 MG tablet, Take 1 tablet (10 mg total) by mouth  daily., Disp: 90 tablet, Rfl: 1 ?  senna-docusate (SENOKOT-S) 8.6-50 MG tablet, Take 1 tablet by mouth 2 (two) times daily., Disp: 60 tablet, Rfl: 0 ?  sodium phosphate (FLEET) 7-19 GM/118ML ENEM, Place 133 mLs (1 enema total) rectally daily as needed for severe constipation., Disp: 133 mL, Rfl: 2 ?  spironolactone (ALDACTONE) 25 MG tablet, Take 25 mg by mouth daily., Disp: , Rfl:  ?  WIXELA INHUB 250-50 MCG/DOSE AEPB, Inhale 1 puff into the lungs 2 (two) times daily., Disp: , Rfl:  ? ?Patient Active Problem List  ? Diagnosis Date Noted  ? Small bowel obstruction ([place]) 05/18/2021  ? Orthostatic hypotension 04/29/2021  ? Constipation 04/28/2021  ? SBO (small bowel obstruction) ([place]) 04/23/2021  ? Hypertensive urgency 04/23/2021  ? GERD without esophagitis 04/23/2021  ? AKI (acute kidney injury) ([place]) 04/23/2021  ? Type 2 diabetes mellitus with diabetic neuropathy ([place]) 04/23/2021  ? Dyslipidemia 04/23/2021  ? Chronic diastolic CHF (congestive heart failure) ([place]) 04/23/2021  ? Cervical spondylosis 02/12/2021  ? Lumbar facet arthropathy 02/12/2021  ? Recurrent UTI 11/13/2020  ? Angina pectoris ([place]) 11/13/2020  ? Major depressive disorder, recurrent episode, moderate ([place]) 11/13/2020  ? Weakness of right arm   ? Adrenal adenoma, right   ? PAD (peripheral artery disease) ([place]) 01/24/2020  ? Abnormal ankle brachial index (ABI)  12/18/2019  ? CAD (coronary artery disease) 04/27/2019  ? Myalgia 04/19/2019  ? Diabetic polyneuropathy associated with type 2 diabetes mellitus ([place]) 03/02/2018  ? Vitamin D deficiency 01/24/2018  ? Overactive blad

## 2021-06-03 ENCOUNTER — Encounter: Payer: Self-pay | Admitting: Family Medicine

## 2021-06-04 ENCOUNTER — Other Ambulatory Visit: Payer: Self-pay | Admitting: Family Medicine

## 2021-06-04 DIAGNOSIS — G894 Chronic pain syndrome: Secondary | ICD-10-CM

## 2021-06-04 DIAGNOSIS — M62838 Other muscle spasm: Secondary | ICD-10-CM

## 2021-06-05 ENCOUNTER — Ambulatory Visit: Payer: Medicare Other | Admitting: Endocrinology

## 2021-06-05 ENCOUNTER — Telehealth: Payer: Self-pay | Admitting: Family Medicine

## 2021-06-05 NOTE — Telephone Encounter (Signed)
Verbal orders given  

## 2021-06-05 NOTE — Telephone Encounter (Signed)
[name] calling with [facility] would like verbal orders for OT   To evaluate and treat.   Please advise

## 2021-06-06 ENCOUNTER — Other Ambulatory Visit: Payer: Self-pay

## 2021-06-06 ENCOUNTER — Encounter: Payer: Self-pay | Admitting: Surgery

## 2021-06-06 ENCOUNTER — Ambulatory Visit (INDEPENDENT_AMBULATORY_CARE_PROVIDER_SITE_OTHER): Payer: Medicare Other | Admitting: Surgery

## 2021-06-06 VITALS — BP 132/82 | HR 74 | Temp 97.5°F | Ht 66.5 in | Wt 256.4 lb

## 2021-06-06 DIAGNOSIS — K5900 Constipation, unspecified: Secondary | ICD-10-CM

## 2021-06-06 DIAGNOSIS — R1084 Generalized abdominal pain: Secondary | ICD-10-CM

## 2021-06-06 DIAGNOSIS — Z8719 Personal history of other diseases of the digestive system: Secondary | ICD-10-CM

## 2021-06-06 NOTE — Patient Instructions (Addendum)
MR scheduled [facility] 06/15/2021 7:30 am . Nothing to eat or drink 4 hours prior. . Please see your follow up appointment listed below.

## 2021-06-06 NOTE — Progress Notes (Signed)
06/06/2021  History of Present Illness: [name] is a 56 y.o. female presenting for follow up of ileus vs SBO.  The patient has been seen in our practice before and also has a history of incisional hernia repair in 2013.  She was recently hospitalized twice last month with possible SBO vs ileus.  I have personally viewed her CT scan images, and there's no specific transition point.  Both times she recovered with conservative measures.  Today, she reports that she's doing well.  She's tolerating a diet and have bowel movements about twice a week.  She uses intermittent Miralax with dulcolax and stool softeners, but does not use all of them at the same time or daily.  Denies any abdominal pain currently.  She reports that as the days go by, she starts feeling more pressure in the mid abdomen, but that this gets better after her bowel movement.  Denies any nausea or vomiting.  Of note, patient reports that she has pain in her left wrist and currently has an ACE bandage in place.  She's worried about tendonitis and has an appointment with Ortho next week.  Past Medical History: Past Medical History:  Diagnosis Date   Anxiety and depression 09/13/2006   Qualifier: Diagnosis of  By: Hassell Done [name], [place]     Arthritis    joint pain    Asthma    COPD (chronic obstructive pulmonary disease) ([place])    chronic bronchitis    Depression    Diabetes mellitus    since age 31; type 2 IDDM   Diabetic neuropathy, painful ([place])    FEET AND HANDS   Diabetic retinopathy ([place])    Diastolic dysfunction    a.  Echo 11/17: EF 60-65%, mild LVH, no RWMA, Gr1DD, mild AI, dilated aortic root measuring 38 mm, mildly dilated ascending aorta   Dilated aortic root (HCC)    71m by echo 11/2015   Dizziness    secondary to diabetes and hypertension    Epilepsy idiopathic petit mal ([place]    last seizure 2012;controlled w/ topomax   GERD (gastroesophageal reflux disease)    Glaucoma    NOT ON ANY EYE DROPS     Headache(784.0)    migraines    Heart murmur    born with    History of stress test    a. 11/17: Normal perfusion, EF 53%, normal study   Hx of blood clots    hematomas removed from left side of brain from [id number]    Hyperlipidemia    Hypertension    Legally blind    left eye    MIGRAINE HEADACHE 09/14/2006   Qualifier: Diagnosis of  By: MarHassell DoneP, Nykedtra     [facility]  [facility].  Patient reported history of without objective documentation   Nephrolithiasis    frequent urination , urination at nite  PT SEEN IN ER 09/22/11 FOR BACK AND RT SIDED PAIN--HAS KNOWN STONE RT URETER AND UA IN ER SHOWED UTI   Pain 09/23/11   LOWER BACK AND RIGHT SIDE--PT HAS RIGHT URETERAL STONE   Pneumonia    hx of 2009   Pyelonephritis    Seizures ([facility]  [facility] ap[facility]    [facility] ; mild   Stress incontinence    Stroke ([facility]  last 2003  RESIDUAL LEFT LEG WEAKNESS--NO OTHER RESIDUAL PROBLEMS     Past Surgical History: Past Surgical  History:  Procedure Laterality Date   BRAIN HEMATOMA EVACUATION     five procedures total, first procedure when 36 months old, last at 56 years of age.   CYSTOSCOPY W/ RETROGRADES  09/24/2011   Procedure: CYSTOSCOPY WITH RETROGRADE PYELOGRAM;  Surgeon: Malka So, MD;  Location: [facility];  Service: Urology;  Laterality: Right;   CYSTOSCOPY WITH URETEROSCOPY  09/24/2011   Procedure: CYSTOSCOPY WITH URETEROSCOPY;  Surgeon: Malka So, MD;  Location: [facility];  Service: Urology;  Laterality: Right;  Balloon dilation right ureter    CYSTOSCOPY/RETROGRADE/URETEROSCOPY  08/24/2011   Procedure: CYSTOSCOPY/RETROGRADE/URETEROSCOPY;  Surgeon: Molli Hazard, MD;  Location: [facility];  Service: Urology;  Laterality: Right;  Cysto, Right retrograde Pyelogram, right stent placement.    INCISIONAL HERNIA REPAIR  05/05/2011   Procedure: LAPAROSCOPIC INCISIONAL HERNIA;  Surgeon: Rolm Bookbinder, MD;  Location: [place];   Service: General;  Laterality: N/A;   kidney stone removal     MASS EXCISION  05/05/2011   Procedure: EXCISION MASS;  Surgeon: Rolm Bookbinder, MD;  Location: [place];  Service: General;  Laterality: Right;   PC[facility]     RETINAL DETACHMENT SURGERY Left 1990   RIGHT/LEFT HEART CATH AND CORONARY ANGIOGRAPHY N/A 04/28/2019   Procedure: RIGHT/LEFT HEART CATH AND CORONARY ANGIOGRAPHY;  Surgeon: Yolonda Kida, MD;  Location: [place] [facility];  Service: Cardiovascular;  Laterality: N/A;   SHUNT REMOVAL     shunt inserted at age 89 removed at age 27    [place] Medications: Prior to Admission medications   Medication Sig Start Date End Date Taking? Authorizing Provider  albuterol (ACCUNEB) 1.25 MG/3ML nebulizer solution Inhale 1 ampule into the lungs every 6 (six) hours as needed for wheezing or shortness of breath.   Yes [provider]  albuterol (PROAIR HFA) 108 (90 Base) MCG/ACT inhaler INHALE 2 PUFFS BY MOUTH EVERY 6 HOURS AS NEEDED FOR WHEEZING OR SHORTNESS OF BREATH 01/24/18  Yes Hubbard Hartshorn, [name]  amitriptyline (ELAVIL) 25 MG tablet Take 2-3 tablets (50-75 mg total) by mouth at bedtime. 01/07/21 07/06/21 Yes Gillis Santa, MD  baclofen (LIORESAL) 10 MG tablet Take 1 tablet (10 mg total) by mouth 3 (three) times daily as needed for muscle spasms. 06/04/21  Yes Delsa Grana, [name]  bisacodyl (DULCOLAX) 5 MG EC tablet Take 1 tablet (5 mg total) by mouth daily. 05/24/21  Yes Nicole Kindred A, DO  budesonide (PULMICORT) 0.5 MG/2ML nebulizer solution Inhale 0.5 mg into the lungs 2 (two) times daily.   Yes Ottie Glazier, MD  butalbital-acetaminophen-caffeine (FIORICET) [phone] MG tablet Take 1-2 tablets by mouth every 6 (six) hours as needed for headache. 03/10/21 03/10/22 Yes Gillis Santa, MD  carvedilol (COREG) 25 MG tablet Take 25 mg by mouth 2 (two) times daily. 03/06/21  Yes [provider]  cholecalciferol (VITAMIN D3) 25 MCG (1000 UNIT) tablet Take  1,000 Units by mouth daily.   Yes [provider]  ciclopirox (PENLAC) 8 % solution Apply topically at bedtime. 02/01/21  Yes [provider]  clopidogrel (PLAVIX) 75 MG tablet Take 1 tablet (75 mg total) by mouth daily. 10/10/19  Yes Towanda Malkin, MD  Continuous Blood Gluc Sensor (FREESTYLE LIBRE 2 SENSOR) MISC Change every 14 days 05/14/21  Yes Elayne Snare, MD  cycloSPORINE (RESTASIS) 0.05 % ophthalmic emulsion Place 2 drops into both eyes 2 (two) times daily.   Yes [provider]  Dulaglutide (TRULICITY) 4.5 IW/9.7LG SOPN Inject 4.5  mg as directed once a week. 10/22/20  Yes Elayne Snare, MD  DULoxetine (CYMBALTA) 60 MG capsule Take 1 capsule (60 mg total) by mouth daily. 02/14/21  Yes Bo Merino, [name]  EPINEPHrine 0.3 mg/0.3 mL IJ SOAJ injection Inject 0.3 mg into the muscle as needed for anaphylaxis. 03/29/19  Yes [provider]  ezetimibe (ZETIA) 10 MG tablet Take 1 tablet (10 mg total) by mouth daily. 04/14/21  Yes Bo Merino, [name]  fluticasone (FLONASE) 50 MCG/ACT nasal spray Place 2 sprays into both nostrils daily. 04/18/19  Yes Towanda Malkin, MD  gabapentin (NEURONTIN) 600 MG tablet 600 mg qAM, 600 mg qPM, 1200 mg QHS 01/07/21  Yes Lateef, Bilal, MD  hydrALAZINE (APRESOLINE) 50 MG tablet Take 1 tablet by mouth 3 (three) times daily. 01/31/21  Yes [provider]  Insulin Disposable Pump (OMNIPOD DASH PODS, GEN 4,) MISC Change pod once daily 01/28/21  Yes Elayne Snare, MD  insulin lispro (HUMALOG KWIKPEN) 200 UNIT/ML KwikPen Inject up to 160 units daily in divided doses as directed 03/26/21  Yes Elayne Snare, MD  Insulin Pen Needle (PEN NEEDLES) 32G X 4 MM MISC Use with Humalog to fill pump daily 05/14/21  Yes Elayne Snare, MD  levETIRAcetam (KEPPRA) 500 MG tablet Take 500-750 mg by mouth See admin instructions. Take 1 tablet (57m) by mouth every morning and take 1 tablets (7546m by mouth every night   Yes [provider]   meclizine (ANTIVERT) 25 MG tablet Take 1 tablet (25 mg total) by mouth 2 (two) times daily as needed for dizziness. 05/23/21  Yes GrNicole Kindred, DO  mirabegron ER (MYRBETRIQ) 50 MG TB24 tablet Take 1 tablet (50 mg total) by mouth daily. 08/20/20  Yes RuMyles GipDO  nitroGLYCERIN (NITROSTAT) 0.4 MG SL tablet Place 0.4 mg under the tongue every 5 (five) minutes as needed for chest pain.   Yes [provider]  olmesartan-hydrochlorothiazide (BENICAR HCT) 40-25 MG tablet TAKE 1 TABLET BY MOUTH EVERY DAY 02/21/21  Yes TaDelsa GranaPA-C  ondansetron (ZOFRAN-ODT) 8 MG disintegrating tablet Take 1 tablet (8 mg total) by mouth every 8 (eight) hours as needed for nausea or vomiting. 05/23/21  Yes GrNicole Kindred, DO  pantoprazole (PROTONIX) 20 MG tablet Take 1 tablet (20 mg total) by mouth daily. 02/14/21  Yes PeBo MerinoFNP  polyethylene glycol (MIRALAX / GLYCOLAX) 17 g packet Take 17 g by mouth daily as needed for mild constipation. 06/01/19  Yes TaDelsa GranaPA-C  potassium chloride SA (KLOR-CON M20) 20 MEQ tablet You have not needed potassium while in the hospital.  Hold until outpatient followup. Patient taking differently: Take 20 mEq by mouth 2 (two) times daily. 05/21/19  Yes LaEnzo BiMD  rosuvastatin (CRESTOR) 10 MG tablet Take 1 tablet (10 mg total) by mouth daily. 02/14/21  Yes PeBo MerinoFNP  senna-docusate (SENOKOT-S) 8.6-50 MG tablet Take 1 tablet by mouth 2 (two) times daily. 05/23/21  Yes GrNicole Kindred, DO  sodium phosphate (FLEET) 7-19 GM/118ML ENEM Place 133 mLs (1 enema total) rectally daily as needed for severe constipation. 05/24/21  Yes GrNicole Kindred, DO  spironolactone (ALDACTONE) 25 MG tablet Take 25 mg by mouth daily.   Yes [provider]  WIGrant RutsNHUB 250-50 MCG/DOSE AEPB Inhale 1 puff into the lungs 2 (two) times daily. 01/22/20  Yes [provider]    Allergies: Allergies  Allergen Reactions   Codeine Swelling and Other (See  Comments)  Swelling and burning of mouth (inside)   Lactose Intolerance (Gi) Nausea And Vomiting    Review of Systems: Review of Systems  Constitutional:  Negative for chills and fever.  Respiratory:  Negative for shortness of breath.   Cardiovascular:  Negative for chest pain.  Gastrointestinal:  Positive for abdominal pain and constipation. Negative for diarrhea, nausea and vomiting.  Skin:  Negative for rash.   Physical Exam BP 132/82   Pulse 74   Temp (!) 97.5 F (36.4 C) (Oral)   Ht 5' 6.5" (1.689 m)   Wt 256 lb 6.4 oz (116.3 kg)   LMP  (LMP Unknown)   SpO2 96%   BMI 40.76 kg/m  CONSTITUTIONAL: No acute distress. HEENT:  Normocephalic, atraumatic, extraocular motion intact. RESPIRATORY:  Lungs are clear, and breath sounds are equal bilaterally. Normal respiratory effort without pathologic use of accessory muscles. CARDIOVASCULAR: Heart is regular without murmurs, gallops, or rubs. GI: The abdomen is soft, obese, non-distended, currently non-tender to palpation.  NEUROLOGIC:  Motor and sensation is grossly normal.  Cranial nerves are grossly intact. PSYCH:  Alert and oriented to person, place and time. Affect is normal.  Labs/Imaging: CT abdomen/pelvis on 04/22/21: IMPRESSION: Dilated stomach, proximal and mid small bowel loops with fluid and air-fluid levels compatible with mid to distal small bowel obstruction. Exact transition or cause not visualized.   Bilateral nephrolithiasis.  No hydronephrosis.   Sigmoid diverticulosis.  CT abdomen/pelvis 05/18/21: IMPRESSION: 1. Fluid-filled dilated stomach is associated with dilated fluid-filled small bowel loops in the pelvis with decompressed distal ileum. A discrete or abrupt small bowel transition zone cannot be identified, but given the distal and terminal ileum is completely decompressed, component of small-bowel obstruction cannot be excluded. Despite the decompressed distal small bowel, the colon is also  diffusely fluid-filled and distended throughout from the cecum to the rectum. Fluid in the rectum suggests diarrheal illness and ileus would be a consideration. 2. Fluid-filled and distended appendix up to 8 mm diameter although no periappendiceal edema or inflammation. This is likely related to the colonic distension. 3. Bilateral nonobstructing renal stones. 4. Stable 2 cm right adrenal adenoma. No followup recommended.  Assessment and Plan: This is a 56 y.o. female with episodes of SBO vs ileus.  --Discussed with the patient again the reasoning for conservative measures during her hospital stay.  It is unclear if her episodes have been from ileus or from SBO.  She does have a history of abdominal surgery in the past, but no clear transition point was identified on CT scan.  She also has chronic constipation issues and at most she has bowel movement twice a week.  In light of this, we did not want to proceed with surgery without knowing that this would truly help her.  She also has different medical comorbidities that do not make her a great surgical candidate for a major open surgery. --As such, discussed with the patient that I would order an MRI enterography to further evaluate for any possible functional vs mechanical issues.  If there's more evidence of kinks/obstruction, then we can discuss more about possible surgery.  If there's no issues on MRI, then she may need GI referral for further evaluation/treatment. --Patient will follow up with me after MRI is done.  I spent 25 minutes dedicated to the care of this patient on the date of this encounter to include pre-visit review of records, face-to-face time with the patient discussing diagnosis and management, and any post-visit coordination of care.  Melvyn Neth, [facility]

## 2021-06-08 ENCOUNTER — Emergency Department: Payer: Medicare Other

## 2021-06-08 ENCOUNTER — Inpatient Hospital Stay
Admission: EM | Admit: 2021-06-08 | Discharge: 2021-06-19 | DRG: 336 | Disposition: A | Discharge status: Home Health | Payer: Medicare Other | Attending: Internal Medicine | Admitting: Internal Medicine

## 2021-06-08 ENCOUNTER — Encounter: Payer: Self-pay | Admitting: Emergency Medicine

## 2021-06-08 DIAGNOSIS — G4733 Obstructive sleep apnea (adult) (pediatric): Secondary | ICD-10-CM | POA: Diagnosis present

## 2021-06-08 DIAGNOSIS — R112 Nausea with vomiting, unspecified: Secondary | ICD-10-CM | POA: Diagnosis not present

## 2021-06-08 DIAGNOSIS — E1165 Type 2 diabetes mellitus with hyperglycemia: Secondary | ICD-10-CM | POA: Diagnosis not present

## 2021-06-08 DIAGNOSIS — K219 Gastro-esophageal reflux disease without esophagitis: Secondary | ICD-10-CM | POA: Diagnosis present

## 2021-06-08 DIAGNOSIS — M1990 Unspecified osteoarthritis, unspecified site: Secondary | ICD-10-CM | POA: Diagnosis present

## 2021-06-08 DIAGNOSIS — K567 Ileus, unspecified: Secondary | ICD-10-CM | POA: Diagnosis not present

## 2021-06-08 DIAGNOSIS — K56600 Partial intestinal obstruction, unspecified as to cause: Secondary | ICD-10-CM | POA: Diagnosis present

## 2021-06-08 DIAGNOSIS — R531 Weakness: Secondary | ICD-10-CM | POA: Diagnosis not present

## 2021-06-08 DIAGNOSIS — D3501 Benign neoplasm of right adrenal gland: Secondary | ICD-10-CM | POA: Diagnosis present

## 2021-06-08 DIAGNOSIS — I129 Hypertensive chronic kidney disease with stage 1 through stage 4 chronic kidney disease, or unspecified chronic kidney disease: Secondary | ICD-10-CM | POA: Diagnosis present

## 2021-06-08 DIAGNOSIS — Z87891 Personal history of nicotine dependence: Secondary | ICD-10-CM

## 2021-06-08 DIAGNOSIS — N200 Calculus of kidney: Secondary | ICD-10-CM | POA: Diagnosis present

## 2021-06-08 DIAGNOSIS — E1169 Type 2 diabetes mellitus with other specified complication: Secondary | ICD-10-CM

## 2021-06-08 DIAGNOSIS — I2510 Atherosclerotic heart disease of native coronary artery without angina pectoris: Secondary | ICD-10-CM | POA: Diagnosis present

## 2021-06-08 DIAGNOSIS — E66813 Obesity, class 3: Secondary | ICD-10-CM | POA: Diagnosis present

## 2021-06-08 DIAGNOSIS — E1122 Type 2 diabetes mellitus with diabetic chronic kidney disease: Secondary | ICD-10-CM | POA: Diagnosis present

## 2021-06-08 DIAGNOSIS — R197 Diarrhea, unspecified: Secondary | ICD-10-CM | POA: Diagnosis present

## 2021-06-08 DIAGNOSIS — Z6841 Body Mass Index (BMI) 40.0 and over, adult: Secondary | ICD-10-CM

## 2021-06-08 DIAGNOSIS — I10 Essential (primary) hypertension: Secondary | ICD-10-CM | POA: Diagnosis present

## 2021-06-08 DIAGNOSIS — K529 Noninfective gastroenteritis and colitis, unspecified: Secondary | ICD-10-CM | POA: Diagnosis present

## 2021-06-08 DIAGNOSIS — J449 Chronic obstructive pulmonary disease, unspecified: Secondary | ICD-10-CM | POA: Diagnosis present

## 2021-06-08 DIAGNOSIS — R569 Unspecified convulsions: Secondary | ICD-10-CM

## 2021-06-08 DIAGNOSIS — Z8249 Family history of ischemic heart disease and other diseases of the circulatory system: Secondary | ICD-10-CM

## 2021-06-08 DIAGNOSIS — Z808 Family history of malignant neoplasm of other organs or systems: Secondary | ICD-10-CM

## 2021-06-08 DIAGNOSIS — N179 Acute kidney failure, unspecified: Secondary | ICD-10-CM | POA: Diagnosis present

## 2021-06-08 DIAGNOSIS — N393 Stress incontinence (female) (male): Secondary | ICD-10-CM | POA: Diagnosis present

## 2021-06-08 DIAGNOSIS — E11319 Type 2 diabetes mellitus with unspecified diabetic retinopathy without macular edema: Secondary | ICD-10-CM | POA: Diagnosis present

## 2021-06-08 DIAGNOSIS — F419 Anxiety disorder, unspecified: Secondary | ICD-10-CM | POA: Diagnosis present

## 2021-06-08 DIAGNOSIS — K5000 Crohn's disease of small intestine without complications: Secondary | ICD-10-CM | POA: Diagnosis present

## 2021-06-08 DIAGNOSIS — N182 Chronic kidney disease, stage 2 (mild): Secondary | ICD-10-CM | POA: Diagnosis present

## 2021-06-08 DIAGNOSIS — Z833 Family history of diabetes mellitus: Secondary | ICD-10-CM

## 2021-06-08 DIAGNOSIS — E875 Hyperkalemia: Secondary | ICD-10-CM

## 2021-06-08 DIAGNOSIS — E11649 Type 2 diabetes mellitus with hypoglycemia without coma: Secondary | ICD-10-CM | POA: Diagnosis present

## 2021-06-08 DIAGNOSIS — Z885 Allergy status to narcotic agent status: Secondary | ICD-10-CM

## 2021-06-08 DIAGNOSIS — Z7951 Long term (current) use of inhaled steroids: Secondary | ICD-10-CM

## 2021-06-08 DIAGNOSIS — Z794 Long term (current) use of insulin: Secondary | ICD-10-CM

## 2021-06-08 DIAGNOSIS — Z9641 Presence of insulin pump (external) (internal): Secondary | ICD-10-CM | POA: Diagnosis present

## 2021-06-08 DIAGNOSIS — Z7902 Long term (current) use of antithrombotics/antiplatelets: Secondary | ICD-10-CM

## 2021-06-08 DIAGNOSIS — Z7985 Long-term (current) use of injectable non-insulin antidiabetic drugs: Secondary | ICD-10-CM

## 2021-06-08 DIAGNOSIS — E785 Hyperlipidemia, unspecified: Secondary | ICD-10-CM | POA: Diagnosis present

## 2021-06-08 DIAGNOSIS — Z79899 Other long term (current) drug therapy: Secondary | ICD-10-CM

## 2021-06-08 DIAGNOSIS — K660 Peritoneal adhesions (postprocedural) (postinfection): Secondary | ICD-10-CM | POA: Diagnosis present

## 2021-06-08 DIAGNOSIS — E739 Lactose intolerance, unspecified: Secondary | ICD-10-CM | POA: Diagnosis present

## 2021-06-08 DIAGNOSIS — G40A09 Absence epileptic syndrome, not intractable, without status epilepticus: Secondary | ICD-10-CM

## 2021-06-08 DIAGNOSIS — H5462 Unqualified visual loss, left eye, normal vision right eye: Secondary | ICD-10-CM | POA: Diagnosis present

## 2021-06-08 DIAGNOSIS — Z8673 Personal history of transient ischemic attack (TIA), and cerebral infarction without residual deficits: Secondary | ICD-10-CM

## 2021-06-08 DIAGNOSIS — F330 Major depressive disorder, recurrent, mild: Secondary | ICD-10-CM | POA: Diagnosis present

## 2021-06-08 DIAGNOSIS — Z8049 Family history of malignant neoplasm of other genital organs: Secondary | ICD-10-CM

## 2021-06-08 DIAGNOSIS — F331 Major depressive disorder, recurrent, moderate: Secondary | ICD-10-CM | POA: Diagnosis present

## 2021-06-08 LAB — COMPREHENSIVE METABOLIC PANEL
ALT: 25 U/L (ref 0–44)
AST: 24 U/L (ref 15–41)
Albumin: 4.6 g/dL (ref 3.5–5.0)
Alkaline Phosphatase: 92 U/L (ref 38–126)
Anion gap: 13 (ref 5–15)
BUN: 22 mg/dL — ABNORMAL HIGH (ref 6–20)
CO2: 28 mmol/L (ref 22–32)
Calcium: 10.7 mg/dL — ABNORMAL HIGH (ref 8.9–10.3)
Chloride: 100 mmol/L (ref 98–111)
Creatinine, Ser: 1.64 mg/dL — ABNORMAL HIGH (ref 0.44–1.00)
GFR, Estimated: 37 mL/min — ABNORMAL LOW (ref >60)
Glucose, Bld: 117 mg/dL — ABNORMAL HIGH (ref 70–99)
Potassium: 4.4 mmol/L (ref 3.5–5.1)
Sodium: 141 mmol/L (ref 135–145)
Total Bilirubin: 0.7 mg/dL (ref 0.3–1.2)
Total Protein: 9.6 g/dL — ABNORMAL HIGH (ref 6.5–8.1)

## 2021-06-08 LAB — CBC WITH DIFFERENTIAL/PLATELET
Abs Immature Granulocytes: 0.07 10*3/uL (ref 0.00–0.07)
Basophils Absolute: 0 10*3/uL (ref 0.0–0.1)
Basophils Relative: 0 %
Eosinophils Absolute: 0 10*3/uL (ref 0.0–0.5)
Eosinophils Relative: 0 %
HCT: 48.9 % — ABNORMAL HIGH (ref 36.0–46.0)
Hemoglobin: 15.1 g/dL — ABNORMAL HIGH (ref 12.0–15.0)
Immature Granulocytes: 1 %
Lymphocytes Relative: 18 %
Lymphs Abs: 2.2 10*3/uL (ref 0.7–4.0)
MCH: 28.4 pg (ref 26.0–34.0)
MCHC: 30.9 g/dL (ref 30.0–36.0)
MCV: 92.1 fL (ref 80.0–100.0)
Monocytes Absolute: 0.9 10*3/uL (ref 0.1–1.0)
Monocytes Relative: 7 %
Neutro Abs: 9.1 10*3/uL — ABNORMAL HIGH (ref 1.7–7.7)
Neutrophils Relative %: 74 %
Platelets: 385 10*3/uL (ref 150–400)
RBC: 5.31 MIL/uL — ABNORMAL HIGH (ref 3.87–5.11)
RDW: 14.2 % (ref 11.5–15.5)
WBC: 12.3 10*3/uL — ABNORMAL HIGH (ref 4.0–10.5)
nRBC: 0 % (ref 0.0–0.2)

## 2021-06-08 LAB — CBG MONITORING, ED: Glucose-Capillary: 95 mg/dL (ref 70–99)

## 2021-06-08 LAB — TROPONIN I (HIGH SENSITIVITY)
Troponin I (High Sensitivity): 7 ng/L (ref <18)
Troponin I (High Sensitivity): 15 ng/L (ref <18)

## 2021-06-08 LAB — MAGNESIUM: Magnesium: 2.5 mg/dL — ABNORMAL HIGH (ref 1.7–2.4)

## 2021-06-08 MED ORDER — MOMETASONE FURO-FORMOTEROL FUM 200-5 MCG/ACT IN AERO
2.0000 | INHALATION_SPRAY | Freq: Two times a day (BID) | RESPIRATORY_TRACT | Status: DC
  Administered 2021-06-09 – 2021-06-10 (×2): 2 via RESPIRATORY_TRACT
  Filled 2021-06-08: qty 8.8

## 2021-06-08 MED ORDER — ONDANSETRON HCL 4 MG/2ML IJ SOLN: 4.0000 mg | Freq: Four times a day (QID) | INTRAMUSCULAR | Status: DC | PRN

## 2021-06-08 MED ORDER — ONDANSETRON HCL 4 MG/2ML IJ SOLN
4.0000 mg | Freq: Once | INTRAMUSCULAR | Status: AC
  Administered 2021-06-08: 4 mg via INTRAVENOUS

## 2021-06-08 MED ORDER — SPIRONOLACTONE 25 MG PO TABS
25.0000 mg | ORAL_TABLET | Freq: Every day | ORAL | Status: DC
  Administered 2021-06-09: 25 mg via ORAL
  Filled 2021-06-08: qty 1

## 2021-06-08 MED ORDER — ALBUTEROL SULFATE (2.5 MG/3ML) 0.083% IN NEBU
2.5000 mL | INHALATION_SOLUTION | Freq: Four times a day (QID) | RESPIRATORY_TRACT | Status: DC | PRN
  Filled 2021-06-08: qty 3

## 2021-06-08 MED ORDER — DULOXETINE HCL 30 MG PO CPEP
60.0000 mg | ORAL_CAPSULE | Freq: Every day | ORAL | Status: DC
  Administered 2021-06-09 – 2021-06-19 (×11): 60 mg via ORAL
  Filled 2021-06-08 (×4): qty 2
  Filled 2021-06-08: qty 1
  Filled 2021-06-08 (×7): qty 2

## 2021-06-08 MED ORDER — PANTOPRAZOLE SODIUM 20 MG PO TBEC
20.0000 mg | DELAYED_RELEASE_TABLET | Freq: Two times a day (BID) | ORAL | Status: DC
Start: 2021-06-09 — End: 2021-06-09
  Administered 2021-06-09: 20 mg via ORAL
  Filled 2021-06-08: qty 1

## 2021-06-08 MED ORDER — SODIUM CHLORIDE 0.9 % IV BOLUS
1000.0000 mL | Freq: Once | INTRAVENOUS | Status: AC
Start: 2021-06-08 — End: 2021-06-09
  Administered 2021-06-08: 1000 mL via INTRAVENOUS

## 2021-06-08 MED ORDER — AMITRIPTYLINE HCL 50 MG PO TABS
50.0000 mg | ORAL_TABLET | Freq: Every day | ORAL | Status: DC
  Administered 2021-06-09: 75 mg via ORAL
  Administered 2021-06-09: 50 mg via ORAL
  Administered 2021-06-10 – 2021-06-11 (×2): 75 mg via ORAL
  Administered 2021-06-12: 50 mg via ORAL
  Administered 2021-06-13 – 2021-06-18 (×6): 75 mg via ORAL
  Filled 2021-06-08 (×4): qty 1.5
  Filled 2021-06-08: qty 1
  Filled 2021-06-08 (×6): qty 1.5

## 2021-06-08 MED ORDER — LEVETIRACETAM 500 MG PO TABS: 500.0000 mg | ORAL_TABLET | ORAL | Status: DC

## 2021-06-08 MED ORDER — ALUM & MAG HYDROXIDE-SIMETH 200-200-20 MG/5ML PO SUSP
30.0000 mL | Freq: Four times a day (QID) | ORAL | Status: DC | PRN
Start: 2021-06-08 — End: 2021-06-09
  Administered 2021-06-09: 30 mL via ORAL
  Filled 2021-06-08: qty 30

## 2021-06-08 MED ORDER — MORPHINE SULFATE (PF) 4 MG/ML IV SOLN
4.0000 mg | INTRAVENOUS | Status: DC | PRN
  Administered 2021-06-09 (×2): 4 mg via INTRAVENOUS
  Filled 2021-06-08 (×2): qty 1

## 2021-06-08 MED ORDER — BACLOFEN 10 MG PO TABS
10.0000 mg | ORAL_TABLET | Freq: Three times a day (TID) | ORAL | Status: DC | PRN
  Administered 2021-06-17: 10 mg via ORAL
  Filled 2021-06-08: qty 1

## 2021-06-08 MED ORDER — ONDANSETRON 4 MG PO TBDP: 4.0000 mg | ORAL_TABLET | Freq: Once | ORAL | Status: DC

## 2021-06-08 MED ORDER — VITAMIN D 25 MCG (1000 UNIT) PO TABS
1000.0000 [IU] | ORAL_TABLET | Freq: Every day | ORAL | Status: DC
  Administered 2021-06-09 – 2021-06-19 (×11): 1000 [IU] via ORAL
  Filled 2021-06-08 (×11): qty 1

## 2021-06-08 MED ORDER — MIRABEGRON ER 50 MG PO TB24
50.0000 mg | ORAL_TABLET | Freq: Every day | ORAL | Status: DC
Start: 2021-06-09 — End: 2021-06-19
  Administered 2021-06-10 – 2021-06-19 (×10): 50 mg via ORAL
  Filled 2021-06-08 (×11): qty 1

## 2021-06-08 MED ORDER — MECLIZINE HCL 25 MG PO TABS
25.0000 mg | ORAL_TABLET | Freq: Two times a day (BID) | ORAL | Status: DC | PRN
  Administered 2021-06-13 – 2021-06-19 (×4): 25 mg via ORAL
  Filled 2021-06-08 (×5): qty 1

## 2021-06-08 MED ORDER — ACETAMINOPHEN 650 MG RE SUPP: 650.0000 mg | Freq: Four times a day (QID) | RECTAL | Status: AC | PRN

## 2021-06-08 MED ORDER — BUDESONIDE 0.5 MG/2ML IN SUSP
0.5000 mg | Freq: Two times a day (BID) | RESPIRATORY_TRACT | Status: DC
  Administered 2021-06-09 – 2021-06-19 (×19): 0.5 mg via RESPIRATORY_TRACT
  Filled 2021-06-08 (×21): qty 2

## 2021-06-08 MED ORDER — IOHEXOL 300 MG/ML  SOLN
75.0000 mL | Freq: Once | INTRAMUSCULAR | Status: AC | PRN
  Administered 2021-06-08: 75 mL via INTRAVENOUS

## 2021-06-08 MED ORDER — EZETIMIBE 10 MG PO TABS
10.0000 mg | ORAL_TABLET | Freq: Every day | ORAL | Status: DC
  Administered 2021-06-09 – 2021-06-19 (×11): 10 mg via ORAL
  Filled 2021-06-08 (×11): qty 1

## 2021-06-08 MED ORDER — HYDROMORPHONE HCL 1 MG/ML IJ SOLN
0.5000 mg | INTRAMUSCULAR | Status: AC | PRN
  Administered 2021-06-09 – 2021-06-10 (×4): 0.5 mg via INTRAVENOUS
  Filled 2021-06-08 (×4): qty 0.5

## 2021-06-08 MED ORDER — DICYCLOMINE HCL 10 MG/5ML PO SOLN: 10.0000 mg | Freq: Two times a day (BID) | ORAL | Status: DC | PRN

## 2021-06-08 MED ORDER — ACETAMINOPHEN 325 MG PO TABS
650.0000 mg | ORAL_TABLET | Freq: Four times a day (QID) | ORAL | Status: AC | PRN
  Administered 2021-06-11: 650 mg via ORAL
  Filled 2021-06-08: qty 2

## 2021-06-08 MED ORDER — ONDANSETRON HCL 4 MG/2ML IJ SOLN
4.0000 mg | Freq: Once | INTRAMUSCULAR | Status: AC
  Administered 2021-06-08: 4 mg via INTRAVENOUS
  Filled 2021-06-08: qty 2

## 2021-06-08 MED ORDER — ONDANSETRON HCL 4 MG PO TABS: 4.0000 mg | ORAL_TABLET | Freq: Four times a day (QID) | ORAL | Status: DC | PRN

## 2021-06-08 MED ORDER — GABAPENTIN 600 MG PO TABS: 600.0000 mg | ORAL_TABLET | ORAL | Status: DC

## 2021-06-08 MED ORDER — LORAZEPAM 2 MG/ML IJ SOLN: 1.0000 mg | INTRAMUSCULAR | Status: DC | PRN

## 2021-06-08 MED ORDER — CARVEDILOL 25 MG PO TABS
25.0000 mg | ORAL_TABLET | Freq: Two times a day (BID) | ORAL | Status: DC
  Administered 2021-06-09 – 2021-06-19 (×22): 25 mg via ORAL
  Filled 2021-06-08 (×22): qty 1

## 2021-06-08 MED ORDER — ROSUVASTATIN CALCIUM 10 MG PO TABS
10.0000 mg | ORAL_TABLET | Freq: Every day | ORAL | Status: DC
  Administered 2021-06-09 – 2021-06-18 (×10): 10 mg via ORAL
  Filled 2021-06-08 (×10): qty 1

## 2021-06-08 MED ORDER — FLEET ENEMA 7-19 GM/118ML RE ENEM: 1.0000 | ENEMA | Freq: Every day | RECTAL | Status: DC | PRN

## 2021-06-08 MED ORDER — HYDRALAZINE HCL 50 MG PO TABS
50.0000 mg | ORAL_TABLET | Freq: Three times a day (TID) | ORAL | Status: DC
  Administered 2021-06-09 – 2021-06-19 (×31): 50 mg via ORAL
  Filled 2021-06-08 (×31): qty 1

## 2021-06-08 MED ORDER — HYDROMORPHONE HCL 1 MG/ML IJ SOLN
0.5000 mg | Freq: Once | INTRAMUSCULAR | Status: AC
  Administered 2021-06-08: 0.5 mg via INTRAVENOUS
  Filled 2021-06-08: qty 0.5

## 2021-06-08 MED ORDER — SODIUM CHLORIDE 0.9 % IV SOLN
INTRAVENOUS | Status: AC
Start: 2021-06-08 — End: 2021-06-09

## 2021-06-08 MED ORDER — CLOPIDOGREL BISULFATE 75 MG PO TABS
75.0000 mg | ORAL_TABLET | Freq: Every day | ORAL | Status: DC
Start: 2021-06-09 — End: 2021-06-09

## 2021-06-08 MED ORDER — BUTALBITAL-APAP-CAFFEINE 50-325-40 MG PO TABS
1.0000 | ORAL_TABLET | Freq: Four times a day (QID) | ORAL | Status: DC | PRN
  Administered 2021-06-11 – 2021-06-15 (×2): 1 via ORAL
  Filled 2021-06-08 (×2): qty 1

## 2021-06-08 MED ORDER — POLYETHYLENE GLYCOL 3350 17 G PO PACK
17.0000 g | PACK | Freq: Every day | ORAL | Status: DC | PRN
  Administered 2021-06-09 – 2021-06-12 (×3): 17 g via ORAL
  Filled 2021-06-08 (×3): qty 1

## 2021-06-08 NOTE — H&P (Addendum)
History and Physical   [name] TUU:[id number] DOB: 1965-06-15 DOA: 06/08/2021  PCP: Delsa Grana, [name]  Patient coming from: Home  I have personally briefly reviewed patient's old medical records in [place].  Chief Concern: Intractable nausea and vomiting  HPI: [name] is a 56 year old female with history of hypertension, hyperlipidemia, multiple hospitalization for small bowel obstructions, history of incisional hernia repair in 2013, anxiety, depression, asthma, history of idiopathic epilepsy/[place] with last seizure being in 2012, controlled with Topamax, GERD, left eye legally blindness, sleep apnea who does not use CPAP at home, stress incontinence, who presents emergency department for chief concerns of generalized weakness, decreased appetite, nausea, vomiting.  Initial vitals in the emergency department showed temperature of 98.5, respiration rate of 33, heart rate of 121, blood pressure 147/105, SPO2 of 100% on room air.  Serum sodium is 141, potassium 4.4, chloride 100, bicarb 28, BUN of 22, serum creatinine of 1.64, nonfasting blood glucose 117, GFR of 37.  Magnesium is 2.5.  WBC 12.3, hemoglobin 15.1, platelets of 385.  CT abdomen pelvis with contrast: Was read as diffusely dilated and fluid-filled stomach and proximal small bowel with gradual transition to more normal caliber terminal ileum.  Fluid-filled proximal colon.  The findings can be seen in setting of underlying infectious or inflammatory enterocolitis.  Mild mesenteric edema.  No free air.  Incomplete duodenal rotation.  Mild bilateral nonobstructing nephrolithiasis.  ED treatment: Dilaudid 0.5 mg IV x2 doses, ondansetron 4 mg IV x2 doses, sodium chloride 1 L bolus.  She is aaox to self, age, current location and current calendar year.  Her abdominal distension started Sunday, on day of presentation.  She states she is not passing gas. She states that the stool is watery for the  last 3-4 days, and it has been brown. She reports that her last good bowel movement was soft and it was Thursday.   She endorses recent antibiotic use for uti, and completed the last dose was Friday, 06/06/2021.  She reports that she vomited 2 or 3 x 1 day of admission. She reports red streaks of blood in her vomitus on day of admission. She denies chest pain, shortness of breath, dysuria, loss of consciousness.  Social history: She lives with her daughter at home. She denies tobacco use, etoh, recreational drug use. She is was a Web designer.   ROS: Constitutional: no weight change, no fever ENT/Mouth: no sore throat, no rhinorrhea Eyes: no eye pain, no vision changes Cardiovascular: no chest pain, no dyspnea,  no edema, no palpitations Respiratory: no cough, no sputum, no wheezing Gastrointestinal: + nausea, + vomiting, no diarrhea, no constipation Genitourinary: no urinary incontinence, no dysuria, no hematuria Musculoskeletal: no arthralgias, no myalgias Skin: no skin lesions, no pruritus, Neuro: + weakness, no loss of consciousness, no syncope Psych: no anxiety, no depression, + decrease appetite Heme/Lymph: no bruising, no bleeding  ED Course: Discussed with emergency medicine provider, patient requiring hospitalization for chief concerns of intractable nausea and vomiting and with concerns for infectious diarrhea.  Assessment/Plan  Principal Problem:   Intractable nausea and vomiting Active Problems:   Seizure (HCC)   OSA (obstructive sleep apnea)   AKI (acute kidney injury) ([place])   GERD without esophagitis   Essential hypertension, benign   Type 2 diabetes mellitus with chronic kidney disease, with long-term current use of insulin (HCC)   Obesity, Class III, BMI 40-49.9 (morbid obesity) ([place])   Dyslipidemia   CVA, old, hemiparesis ([place])  CAD (coronary artery disease)   Ileus (HCC)   Major depressive disorder, recurrent episode, moderate (HCC)   Diarrhea   Assessment and  Plan:  * Intractable nausea and vomiting - Multifactorial including possible small bowel obstruction/ileus in setting of GERD - Symptomatic support with ondansetron 4 mg p.o. every 6 hours.  For nausea, ondansetron IV as 4 mg every 6 hours.  For nausea and vomiting, 5 days ordered  Seizure ([place]) - Resumed home Keppra 500 mg daily, 750 mg nightly; gabapentin 600 mg twice daily, 1200 mg nightly - Fall precautions   AKI (acute kidney injury) ([place]) - Presumed secondary to prerenal in setting of poor p.o. intake in setting of nausea and vomiting and diarrhea - BMP in the a.m.  OSA (obstructive sleep apnea) - CPAP nightly ordered  GERD without esophagitis - Protonix 40 mg IV twice daily  Type 2 diabetes mellitus with chronic kidney disease, with long-term current use of insulin (HCC) - Patient's insulin dosing is reported as insulin lispro, inject 160 units daily in divided doses as directed, Trulicity weekly injection - I have ordered insulin SSI with at bedtime coverage - D50, 1 amp as needed for hypoglycemia, 5 days ordered - Goal inpatient blood glucose levels 140-180  Dyslipidemia - Ezetimibe 10 mg daily, rosuvastatin 10 mg nightly resumed  Diarrhea - Query paradoxical diarrhea - However given recent antibiotic use for UTI, infectious diarrhea cannot be excluded at this time - GI panel, and C. difficile screen order placed  Major depressive disorder, recurrent episode, moderate (HCC) - Resumed home duloxetine 60 mg daily, amitriptyline 70 to 75 mg nightly  Ileus ([place]) - Symptomatic support, previously patient symptoms have resolved with conservative management - I have not consulted general surgery at this time - A.m. team to consult if patient's symptoms do not improve  CAD (coronary artery disease) - Rosuvastatin 10 mg nightly, spironolactone 25 mg daily, carvedilol 25 mg p.o. twice daily have been resumed - I have not resumed Plavix 75 mg daily due to ileus versus small  bowel obstruction - Per med reconciliation patient last dose of Plavix was 06/08/2021  Hyperlipemia - Ezetimibe 10 mg daily resumed  Chart reviewed.   DVT prophylaxis: Heparin 5000 units subcutaneous every 8 hours Code Status: Full code Diet: N.p.o. except for sips with meds and ice chips Family Communication: No Disposition Plan: Pending clinical course Consults called: None at this time Admission status: Telemetry medical, observation  Past Medical History:  Diagnosis Date   Anxiety and depression 09/13/2006   Qualifier: Diagnosis of  By: Hassell Done [name], Nykedtra     Arthritis    joint pain    Asthma    COPD (chronic obstructive pulmonary disease) ([place])    chronic bronchitis    Depression    Diabetes mellitus    since age 75; type 2 IDDM   Diabetic neuropathy, painful ([place])    FEET AND HANDS   Diabetic retinopathy ([place])    Diastolic dysfunction    a.  Echo 11/17: EF 60-65%, mild LVH, no RWMA, Gr1DD, mild AI, dilated aortic root measuring 38 mm, mildly dilated ascending aorta   Dilated aortic root ([place])    61m by echo 11/2015   Dizziness    secondary to diabetes and hypertension    Epilepsy [place]    last seizure 2012;controlled w/ topomax   GERD (gastroesophageal reflux disease)    Glaucoma    NOT ON ANY EYE DROPS    Headache(784.0)  migraines    Heart murmur    born with    History of stress test    a. 11/17: Normal perfusion, EF 53%, normal study   Hx of blood clots    hematomas removed from left side of brain from 37mo to 89yrold    Hyperlipidemia    Hypertension    Legally blind    left eye    MIGRAINE HEADACHE 09/14/2006   Qualifier: Diagnosis of  By: MaHassell DoneNP, Nykedtra     [facility]   a.  Patient reported history of without objective documentation   Nephrolithiasis    frequent urination , urination at nite  PT SEEN IN ER 09/22/11 FOR BACK AND RT SIDED PAIN--HAS KNOWN STONE RT URETER AND UA IN ER SHOWED UTI   Pain  09/23/11   LOWER BACK AND RIGHT SIDE--PT HAS RIGHT URETERAL STONE   Pneumonia    hx of 2009   Pyelonephritis    Seizures ([facility]   Sleep apnea    [facility] not [facility] ; mild   Stress incontinence    Stroke ([facility]   last 2003  RESIDUAL LEFT LEG WEAKNESS--NO OTHER RESIDUAL PROBLEMS   Past Surgical History:  Procedure Laterality Date   BRAIN HEMATOMA EVACUATION     five procedures total, first procedure when 895 monthsld, last at [age]ears of age.   CYSTOSCOPY W/ RETROGRADES  09/24/2011   Procedure: CYSTOSCOPY WITH RETROGRADE PYELOGRAM;  Surgeon: JoMalka SoMD;  Location: [facility];  Service: Urology;  Laterality: Right;   CYSTOSCOPY WITH URETEROSCOPY  09/24/2011   Procedure: CYSTOSCOPY WITH URETEROSCOPY;  Surgeon: JoMalka SoMD;  Location: [facility];  Service: Urology;  Laterality: Right;  Balloon dilation right ureter    CYSTOSCOPY/RETROGRADE/URETEROSCOPY  08/24/2011   Procedure: CYSTOSCOPY/RETROGRADE/URETEROSCOPY;  Surgeon: DaMolli HazardMD;  Location: [facility];  Service: Urology;  Laterality: Right;  Cysto, Right retrograde Pyelogram, right stent placement.    INCISIONAL HERNIA REPAIR  05/05/2011   Procedure: LAPAROSCOPIC INCISIONAL HERNIA;  Surgeon: MaRolm BookbinderMD;  [facility] Service: General;  Laterality: N/A;   kidney stone removal     MASS EXCISION  05/05/2011   Procedure: EXCISION MASS;  Surgeon: MaRolm BookbinderMD;  [facility] Service: General;  Laterality: Right;   [facility]     RETINAL DETACHMENT SURGERY Left 1990   RIGHT/LEFT HEART CATH AND CORONARY ANGIOGRAPHY N/A 04/28/2019   Procedure: RIGHT/LEFT HEART CATH AND CORONARY ANGIOGRAPHY;  Surgeon: CaYolonda KidaMD;  [facility];  Service: Cardiovascular;  Laterality: N/A;   SHUNT REMOVAL     shunt [place] at age [age][place] 56  [place] Social History:  reports that she quit smoking about 34 years ago. Her smoking use included  cigarettes. She has a 15.00 pack-year smoking history. She has never used smokeless tobacco. She reports current alcohol use of about 1.0 standard drink per week. She reports that she does not use drugs.  Allergies  Allergen Reactions   Codeine Swelling and Other (See Comments)    Swelling and burning of mouth (inside)   Lactose Intolerance (Gi) Nausea And Vomiting   Family History  Problem Relation Age of Onset   Uterine cancer Mother    Hypertension Mother    Cancer Mother    Brain cancer Maternal Grandmother    Hypertension Maternal Grandmother  Birth defects Daughter    Hypertension Daughter    Cirrhosis Maternal Grandfather    ADD / ADHD Maternal Grandfather    Birth defects Maternal Grandfather    Diabetes Maternal Grandfather    Anesthesia problems Neg Hx    Colon cancer Neg Hx    Esophageal cancer Neg Hx    Pancreatic cancer Neg Hx    Family history: Family history reviewed and not pertinent  Prior to Admission medications   Medication Sig Start Date End Date Taking? Authorizing Provider  amitriptyline (ELAVIL) 25 MG tablet Take 2-3 tablets (50-75 mg total) by mouth at bedtime. 01/07/21 07/06/21 Yes Gillis Santa, MD  bisacodyl (DULCOLAX) 5 MG EC tablet Take 1 tablet (5 mg total) by mouth daily. 05/24/21  Yes Nicole Kindred A, DO  budesonide (PULMICORT) 0.5 MG/2ML nebulizer solution Inhale 0.5 mg into the lungs 2 (two) times daily.   Yes Ottie Glazier, MD  carvedilol (COREG) 25 MG tablet Take 25 mg by mouth 2 (two) times daily. 03/06/21  Yes [provider]  cholecalciferol (VITAMIN D3) 25 MCG (1000 UNIT) tablet Take 1,000 Units by mouth daily.   Yes [provider]  ciclopirox (PENLAC) 8 % solution Apply topically at bedtime. 02/01/21  Yes [provider]  clopidogrel (PLAVIX) 75 MG tablet Take 1 tablet (75 mg total) by mouth daily. 10/10/19  Yes Towanda Malkin, MD  Continuous Blood Gluc Sensor (FREESTYLE LIBRE 2 SENSOR) MISC Change  every 14 days 05/14/21  Yes Elayne Snare, MD  cycloSPORINE (RESTASIS) 0.05 % ophthalmic emulsion Place 2 drops into both eyes 2 (two) times daily.   Yes [provider]  Dulaglutide (TRULICITY) 4.5 QB/1.6XI SOPN Inject 4.5 mg as directed once a week. 10/22/20  Yes Elayne Snare, MD  DULoxetine (CYMBALTA) 60 MG capsule Take 1 capsule (60 mg total) by mouth daily. 02/14/21  Yes Bo Merino, [name]  ezetimibe (ZETIA) 10 MG tablet Take 1 tablet (10 mg total) by mouth daily. 04/14/21  Yes Bo Merino, [name]  fluticasone (FLONASE) 50 MCG/ACT nasal spray Place 2 sprays into both nostrils daily. 04/18/19  Yes Towanda Malkin, MD  gabapentin (NEURONTIN) 600 MG tablet 600 mg qAM, 600 mg qPM, 1200 mg QHS 01/07/21  Yes Lateef, Bilal, MD  hydrALAZINE (APRESOLINE) 50 MG tablet Take 1 tablet by mouth 3 (three) times daily. 01/31/21  Yes [provider]  Insulin Disposable Pump (OMNIPOD DASH PODS, GEN 4,) MISC Change pod once daily 01/28/21  Yes Elayne Snare, MD  insulin lispro (HUMALOG KWIKPEN) 200 UNIT/ML KwikPen Inject up to 160 units daily in divided doses as directed 03/26/21  Yes Elayne Snare, MD  Insulin Pen Needle (PEN NEEDLES) 32G X 4 MM MISC Use with Humalog to fill pump daily 05/14/21  Yes Elayne Snare, MD  levETIRAcetam (KEPPRA) 500 MG tablet Take 500-750 mg by mouth See admin instructions. Take 1 tablet (593m) by mouth every morning and take 1 tablets (7572m by mouth every night   Yes [provider]  mirabegron ER (MYRBETRIQ) 50 MG TB24 tablet Take 1 tablet (50 mg total) by mouth daily. 08/20/20  Yes RuMyles GipDO  olmesartan-hydrochlorothiazide (BENICAR HCT) 40-25 MG tablet TAKE 1 TABLET BY MOUTH EVERY DAY 02/21/21  Yes TaDelsa GranaPA-C  pantoprazole (PROTONIX) 20 MG tablet Take 1 tablet (20 mg total) by mouth daily. 02/14/21  Yes PeBo MerinoFNP  potassium chloride SA (KLOR-CON M20) 20 MEQ tablet You have not needed potassium while in the hospital.  Hold until  outpatient followup. Patient taking differently: Take 20 mEq by mouth 2 (two) times daily. 05/21/19  Yes Enzo Bi, MD  rosuvastatin (CRESTOR) 10 MG tablet Take 1 tablet (10 mg total) by mouth daily. 02/14/21  Yes Bo Merino, [name]  senna-docusate (SENOKOT-S) 8.6-50 MG tablet Take 1 tablet by mouth 2 (two) times daily. 05/23/21  Yes Nicole Kindred A, DO  spironolactone (ALDACTONE) 25 MG tablet Take 25 mg by mouth daily.   Yes [provider]  Grant Ruts INHUB 250-50 MCG/DOSE AEPB Inhale 1 puff into the lungs 2 (two) times daily. 01/22/20  Yes [provider]  albuterol (ACCUNEB) 1.25 MG/3ML nebulizer solution Inhale 1 ampule into the lungs every 6 (six) hours as needed for wheezing or shortness of breath.    [provider]  albuterol (PROAIR HFA) 108 (90 Base) MCG/ACT inhaler INHALE 2 PUFFS BY MOUTH EVERY 6 HOURS AS NEEDED FOR WHEEZING OR SHORTNESS OF BREATH 01/24/18   Hubbard Hartshorn, [name]  baclofen (LIORESAL) 10 MG tablet Take 1 tablet (10 mg total) by mouth 3 (three) times daily as needed for muscle spasms. 06/04/21   Delsa Grana, [name]  butalbital-acetaminophen-caffeine (FIORICET) [phone] MG tablet Take 1-2 tablets by mouth every 6 (six) hours as needed for headache. 03/10/21 03/10/22  Gillis Santa, MD  EPINEPHrine 0.3 mg/0.3 mL IJ SOAJ injection Inject 0.3 mg into the muscle as needed for anaphylaxis. 03/29/19   [provider]  meclizine (ANTIVERT) 25 MG tablet Take 1 tablet (25 mg total) by mouth 2 (two) times daily as needed for dizziness. 05/23/21   Ezekiel Slocumb, DO  nitroGLYCERIN (NITROSTAT) 0.4 MG SL tablet Place 0.4 mg under the tongue every 5 (five) minutes as needed for chest pain.    [provider]  ondansetron (ZOFRAN-ODT) 8 MG disintegrating tablet Take 1 tablet (8 mg total) by mouth every 8 (eight) hours as needed for nausea or vomiting. 05/23/21   Nicole Kindred A, DO  polyethylene glycol (MIRALAX / GLYCOLAX) 17 g packet Take 17 g by mouth daily  as needed for mild constipation. 06/01/19   Delsa Grana, [name]  sodium phosphate (FLEET) 7-19 GM/118ML ENEM Place 133 mLs (1 enema total) rectally daily as needed for severe constipation. 05/24/21   Ezekiel Slocumb, DO   Physical Exam: Vitals:   06/08/21 2200 06/08/21 2230 06/09/21 0000 06/09/21 0130  BP: (!) 159/96 (!) 160/101 (!) 156/95 (!) 150/99  Pulse: (!) 111 (!) 111 (!) 109 (!) 106  Resp: (!) 26 (!) 24 (!) 24 15  Temp:      TempSrc:      SpO2: 97% 100% 100% 100%  Weight:      Height:       Constitutional: appears older than chronological age, NAD, calm, comfortable Eyes: PERRL, lids and conjunctivae normal ENMT: Mucous membranes are moist. Posterior pharynx clear of any exudate or lesions. Age-appropriate dentition. Hearing appropriate Neck: normal, supple, no masses, no thyromegaly Respiratory: clear to auscultation bilaterally, no wheezing, no crackles. Normal respiratory effort. No accessory muscle use.  Cardiovascular: Regular rate and rhythm, no murmurs / rubs / gallops. No extremity edema. 2+ pedal pulses. No carotid bruits.  Abdomen: Morbidly obese abdomen with pannus, no tenderness, no masses palpated, no hepatosplenomegaly. Bowel sounds positive.  Musculoskeletal: no clubbing / cyanosis. No joint deformity upper and lower extremities. Good ROM, no contractures, no atrophy. Normal muscle tone.  Skin: no rashes, lesions, ulcers. No induration Neurologic: Sensation intact. Strength 5/5 in all 4.  Psychiatric:  Normal judgment and insight. Alert and oriented x 3. Normal mood.   EKG: independently reviewed, showing sinus tachycardia with rate of 133, QTc 431  Chest x-ray on Admission: I personally reviewed and I agree with radiologist reading as below.  CT ABDOMEN PELVIS W CONTRAST  Result Date: 06/08/2021 CLINICAL DATA:  Bowel obstruction suspected EXAM: CT ABDOMEN AND PELVIS WITH CONTRAST TECHNIQUE: Multidetector CT imaging of the abdomen and pelvis was performed using the  standard protocol following bolus administration of intravenous contrast. RADIATION DOSE REDUCTION: This exam was performed according to the departmental dose-optimization program which includes automated exposure control, adjustment of the mA and/or kV according to patient size and/or use of iterative reconstruction technique. CONTRAST:  24m OMNIPAQUE IOHEXOL 300 MG/ML  SOLN COMPARISON:  None Available. FINDINGS: Lower chest: Bibasilar ground-glass opacity may relate to atelectasis. The distal esophagus is fluid-filled and the gastroesophageal junction is patulous which, together, suggest changes of gastroesophageal reflux. Cardiac size within normal limits. Hepatobiliary: No focal liver abnormality is seen. No gallstones, gallbladder wall thickening, or biliary dilatation. Pancreas: Unremarkable Spleen: Unremarkable Adrenals/Urinary Tract: Stable 2.7 cm right adrenal nodule since remote prior examination of 05/12/2011 compatible with an adrenal adenoma. Left adrenal gland is unremarkable. The kidneys are normal in size and position. Multiple nonobstructing calculi are seen within the right kidney measuring up to 6 mm within the upper pole. No hydronephrosis. No ureteral calculi. The bladder is unremarkable. Stomach/Bowel: There is again noted incomplete duodenal rotation with the duodenum crossing midline, however, the fourth portion of the duodenum does not elevate to the level of the pylorus. Similar to multiple prior examinations, the stomach and proximal and mid small bowel is diffusely fluid-filled and dilated without injury discrete point of transition noted. The caliber of small bowel gradually transitions to a normal, decompressed caliber in the region the terminal ileum. Additionally, the proximal colon appears fluid filled. Together, the findings can be seen in the setting of an underlying infectious or inflammatory enterocolitis. No evidence of obstruction or focal inflammation. Trace mesenteric edema  and ascites noted. Vascular/Lymphatic: No significant vascular findings are present. No enlarged abdominal or pelvic lymph nodes. Reproductive: Status post hysterectomy. No adnexal masses. Other: No abdominal wall hernia. Musculoskeletal: No acute bone abnormality. IMPRESSION: Diffusely dilated and fluid-filled stomach and proximal small bowel with gradual transition to more normal caliber terminal ileum. Additional fluid-filled proximal colon. The findings can be seen in the setting of an underlying infectious or inflammatory enterocolitis appears similar to numerous prior examinations. Mild mesenteric edema. No free air. Incomplete duodenal rotation. Fluid within the distal esophagus likely related to gastroesophageal reflux. Stable 2.7 cm right adrenal adenoma. Mild bilateral nonobstructing nephrolithiasis. Electronically Signed   By: AFidela SalisburyM.D.   On: 06/08/2021 22:22   DG Chest Port 1 View  Result Date: 06/08/2021 CLINICAL DATA:  Weakness and decreased appetite, hypoglycemia on presentation. EXAM: PORTABLE CHEST 1 VIEW COMPARISON:  PA chest 05/24/2021 FINDINGS: There is mild cardiomegaly with normal caliber of the central vessels, stable mediastinum with mild aortic tortuosity and atherosclerosis. The lungs hypoinflated with elevated right hemidiaphragm and limited view of the lower zones particularly on the right. There are perihilar atelectatic bands without visible pneumonia. No pleural effusion is seen. There is thoracic spondylosis and slight dextroscoliosis. IMPRESSION: 1. Low-inspiration study. There is limited visualization of the lower zones but no focal infiltrate in the visualized lungs. 2. Cardiomegaly without findings of CHF. 3. Aortic atherosclerosis. Electronically Signed   By: KNinfa LindenD.  On: 06/08/2021 21:19    Labs on Admission: I have personally reviewed following labs  CBC: Recent Labs  Lab 06/02/21 1129 06/08/21 2013  WBC 7.5 12.3*  NEUTROABS 4,298 9.1*  HGB  12.4 15.1*  HCT 38.6 48.9*  MCV 90.2 92.1  PLT 319 712   Basic Metabolic Panel: Recent Labs  Lab 06/02/21 1129 06/08/21 2013  NA 144 141  K 5.4* 4.4  CL 105 100  CO2 31 28  GLUCOSE 63* 117*  BUN 21 22*  CREATININE 1.28* 1.64*  CALCIUM 9.8 10.7*  MG 2.1 2.5*   GFR: Estimated Creatinine Clearance: 50 mL/min (A) (by C-G formula based on SCr of 1.64 mg/dL (H)).  Liver Function Tests: Recent Labs  Lab 06/08/21 2013  AST 24  ALT 25  ALKPHOS 92  BILITOT 0.7  PROT 9.6*  ALBUMIN 4.6   CBG: Recent Labs  Lab 06/08/21 2007  GLUCAP 95   Urine analysis:    Component Value Date/Time   COLORURINE YELLOW (A) 05/23/2021 1028   APPEARANCEUR CLOUDY (A) 05/23/2021 1028   LABSPEC 1.013 05/23/2021 1028   PHURINE 7.0 05/23/2021 1028   GLUCOSEU NEGATIVE 05/23/2021 1028   GLUCOSEU NEGATIVE 09/21/2019 1038   HGBUR NEGATIVE 05/23/2021 1028   HGBUR negative 09/30/2006 1400   BILIRUBINUR NEGATIVE 05/23/2021 1028   BILIRUBINUR negative 06/04/2020 [place] 05/23/2021 1028   PROTEINUR NEGATIVE 05/23/2021 1028   UROBILINOGEN 0.2 06/04/2020 1535   UROBILINOGEN 0.2 09/21/2019 1038   NITRITE NEGATIVE 05/23/2021 1028   LEUKOCYTESUR LARGE (A) 05/23/2021 1028   Dr. Tobie Poet [facility]  If 7PM-7AM, please contact overnight-coverage provider If 7AM-7PM, please contact day coverage provider [email or web address]  06/09/2021, 2:14 AM

## 2021-06-08 NOTE — Hospital Course (Addendum)
[name] is a 56 year old female with history of hypertension, hyperlipidemia, multiple hospitalization for small bowel obstructions, history of incisional hernia repair in 2013, anxiety, depression, asthma, history of idiopathic epilepsy/[place] with last seizure being in 2012, controlled with Topamax, GERD, left eye legally blindness, sleep apnea who does not use CPAP at home, stress incontinence, who presents emergency department for chief concerns of generalized weakness, decreased appetite, nausea, vomiting.  Initial vitals in the emergency department showed temperature of 98.5, respiration rate of 33, heart rate of 121, blood pressure 147/105, SPO2 of 100% on room air.  Serum sodium is 141, potassium 4.4, chloride 100, bicarb 28, BUN of 22, serum creatinine of 1.64, nonfasting blood glucose 117, GFR of 37.  Magnesium is 2.5.  WBC 12.3, hemoglobin 15.1, platelets of 385.  CT abdomen pelvis with contrast: Was read as diffusely dilated and fluid-filled stomach and proximal small bowel with gradual transition to more normal caliber terminal ileum.  Fluid-filled proximal colon.  The findings can be seen in setting of underlying infectious or inflammatory enterocolitis.  Mild mesenteric edema.  No free air.  Incomplete duodenal rotation.  Mild bilateral nonobstructing nephrolithiasis.  ED treatment: Dilaudid 0.5 mg IV x2 doses, ondansetron 4 mg IV x2 doses, sodium chloride 1 L bolus.  5/22: Patient continued to have abdominal pain with nausea and vomiting.  Compazine was also added with Zofran. Patient was recently seen by general surgery and MR with enterography was ordered and scheduled for 5/28. Worsening leukocytosis. NG tube was placed with significant drainage. General surgery was also consulted for concern of another SBO. She will be most likely going to the OR for exploratory laparotomy as this is her third admission for similar reasons in the past 74-month  5/23: Symptoms  are improving with NG tube, significant drainage of above 2 L in the past 24-hour.  Continue to have some diarrhea.  General surgery is planning exploratory laparotomy on 06/12/2021. PICC line to be placed today followed by starting TPN. Renal functions improving, creatinine at 1.05 today. Leukocytosis resolved, all cell lines decreased so there must be some dilutional effect. C. difficile and GI pathogen panel negative.

## 2021-06-08 NOTE — ED Triage Notes (Addendum)
Pt presents via EMS with complaints of hypoglycemia CBG with EMS was 85. Endorses weakness, decreased appetite, N/V, and "not feeling well". She notes that her CBG is typically around 140. Insulin device was removed by EMS and left at her house.   CBG in triage 95.

## 2021-06-08 NOTE — ED Provider Notes (Signed)
[facility] Provider Note    Event Date/Time   First MD Initiated Contact with Patient 06/08/21 2031     (approximate)   History   Weakness   HPI  [name] is a 56 y.o. female who comes in with EMS due to concerns of low blood sugar.  Patient endorsing weakness decreased appetite nausea and vomiting just not feeling well.  Patient's sugar was 95 in triage.  Patient had an insulin device that was removed by EMS and left at her house.  She reports that she has had these episodes previously and required admission and they told her that she would maybe have to have surgery on her abdomen but they have been trying to avoid it.  She reports that she has had some diarrhea and has not had any stool samples.  She denies any chest pain, shortness of breath.      Physical Exam   Triage Vital Signs: ED Triage Vitals  Enc Vitals Group     BP 06/08/21 2010 (!) 143/105     Pulse Rate 06/08/21 2010 (!) 132     Resp 06/08/21 2010 20     Temp 06/08/21 2010 98.5 F (36.9 C)     Temp Source 06/08/21 2010 Oral     SpO2 06/08/21 2005 98 %     Weight 06/08/21 2009 256 lb (116.1 kg)     Height 06/08/21 2009 5' 6.5" (1.689 m)     Head Circumference --      Peak Flow --      Pain Score --      Pain Loc --      Pain Edu? --      Excl. in Pink? --     Most recent vital signs: Vitals:   06/08/21 2005 06/08/21 2010  BP:  (!) 143/105  Pulse:  (!) 132  Resp:  20  Temp:  98.5 F (36.9 C)  SpO2: 98% 96%     General: Awake, no distress.  CV:  Good peripheral perfusion.  Tachycardic Resp:  Normal effort.  Abd:  Distended abdomen, tender Other:  No leg swelling   ED Results / Procedures / Treatments   Labs (all labs ordered are listed, but only abnormal results are displayed) Labs Reviewed  CBC WITH DIFFERENTIAL/PLATELET - Abnormal; Notable for the following components:      Result Value   WBC 12.3 (*)    RBC 5.31 (*)    Hemoglobin 15.1 (*)     HCT 48.9 (*)    Neutro Abs 9.1 (*)    All other components within normal limits  COMPREHENSIVE METABOLIC PANEL  CBG MONITORING, ED  TROPONIN I (HIGH SENSITIVITY)     EKG  My interpretation of EKG:  Sinus tachycardia rate of 133 without any ST elevation or T wave inversions, normal intervals  RADIOLOGY I have reviewed the CT personally and she has a lot of dilated loops of bowels  Diffusely dilated and fluid-filled stomach and proximal small bowel with gradual transition to more normal caliber terminal ileum. Additional fluid-filled proximal colon. The findings can be seen in the setting of an underlying infectious or inflammatory enterocolitis appears similar to numerous prior examinations. Mild mesenteric edema. No free air.   Incomplete duodenal rotation.   Fluid within the distal esophagus likely related to gastroesophageal reflux.   Stable 2.7 cm right adrenal adenoma.   Mild bilateral nonobstructing nephrolithiasis.    PROCEDURES:  Critical Care performed: No  .  1-3 Lead EKG Interpretation Performed by: Vanessa Juda, MD Authorized by: Vanessa Peapack and [place], MD     Interpretation: abnormal     ECG rate:  110   ECG rate assessment: tachycardic     Rhythm: sinus tachycardia     Ectopy: none     Conduction: normal     MEDICATIONS ORDERED IN ED: Medications  butalbital-acetaminophen-caffeine (FIORICET) 50-325-40 MG per tablet 1-2 tablet (has no administration in time range)  rosuvastatin (CRESTOR) tablet 10 mg (has no administration in time range)  acetaminophen (TYLENOL) tablet 650 mg (has no administration in time range)    Or  acetaminophen (TYLENOL) suppository 650 mg (has no administration in time range)  ondansetron (ZOFRAN) tablet 4 mg (has no administration in time range)    Or  ondansetron (ZOFRAN) injection 4 mg (has no administration in time range)  0.9 %  sodium chloride infusion (has no administration in time range)  LORazepam (ATIVAN) injection 1 mg  (has no administration in time range)  morphine (PF) 4 MG/ML injection 4 mg (has no administration in time range)  HYDROmorphone (DILAUDID) injection 0.5 mg (has no administration in time range)  ondansetron (ZOFRAN) injection 4 mg (4 mg Intravenous Given 06/08/21 2021)  sodium chloride 0.9 % bolus 1,000 mL (1,000 mLs Intravenous New Bag/Given 06/08/21 2052)  HYDROmorphone (DILAUDID) injection 0.5 mg (0.5 mg Intravenous Given 06/08/21 2052)  ondansetron (ZOFRAN) injection 4 mg (4 mg Intravenous Given 06/08/21 2052)  iohexol (OMNIPAQUE) 300 MG/ML solution 75 mL (75 mLs Intravenous Contrast Given 06/08/21 2145)  HYDROmorphone (DILAUDID) injection 0.5 mg (0.5 mg Intravenous Given 06/08/21 2236)     IMPRESSION / MDM / ASSESSMENT AND PLAN / ED COURSE  I reviewed the triage vital signs and the nursing notes.   Patient comes in with similar symptoms when she had her prior abdominal issues. This concerning for acute life-threatening pathology such as differential including obstruction, perforation, colitis.  Will obtain CT imaging and labs to evaluate  CT scan concerning for enterocolitis and she does report some diarrhea has not ever had stool studies ordered therefore will order them.    CBC stable hemoglobin troponin negative CMP slightly elevated creatinine getting fluids  Given continued symptoms will discuss with hospitalist for admission  The patient is on the cardiac monitor to evaluate for evidence of arrhythmia and/or significant heart rate changes.      FINAL CLINICAL IMPRESSION(S) / ED DIAGNOSES   Final diagnoses:  AKI (acute kidney injury) ([place])  Colitis     Rx / DC Orders   ED Discharge Orders     None        Note:  This document was prepared using Dragon voice recognition software and may include unintentional dictation errors.   Vanessa Bryant, MD 06/08/21 [phone]

## 2021-06-08 NOTE — ED Notes (Signed)
Pt is diaphoretic in triage and tachycardic. Endorses extreme nausea and vomits in triage - EKG and labs obtained IV Zofran administered.

## 2021-06-09 ENCOUNTER — Observation Stay: Payer: Medicare Other

## 2021-06-09 ENCOUNTER — Inpatient Hospital Stay: Payer: Medicare Other

## 2021-06-09 ENCOUNTER — Other Ambulatory Visit: Payer: Self-pay

## 2021-06-09 ENCOUNTER — Other Ambulatory Visit: Payer: Self-pay | Admitting: Surgery

## 2021-06-09 DIAGNOSIS — E875 Hyperkalemia: Secondary | ICD-10-CM

## 2021-06-09 DIAGNOSIS — K219 Gastro-esophageal reflux disease without esophagitis: Secondary | ICD-10-CM | POA: Diagnosis present

## 2021-06-09 DIAGNOSIS — R197 Diarrhea, unspecified: Secondary | ICD-10-CM | POA: Diagnosis not present

## 2021-06-09 DIAGNOSIS — R531 Weakness: Secondary | ICD-10-CM | POA: Diagnosis present

## 2021-06-09 DIAGNOSIS — E11319 Type 2 diabetes mellitus with unspecified diabetic retinopathy without macular edema: Secondary | ICD-10-CM | POA: Diagnosis present

## 2021-06-09 DIAGNOSIS — E1122 Type 2 diabetes mellitus with diabetic chronic kidney disease: Secondary | ICD-10-CM | POA: Diagnosis present

## 2021-06-09 DIAGNOSIS — K56609 Unspecified intestinal obstruction, unspecified as to partial versus complete obstruction: Secondary | ICD-10-CM | POA: Diagnosis not present

## 2021-06-09 DIAGNOSIS — R112 Nausea with vomiting, unspecified: Secondary | ICD-10-CM

## 2021-06-09 DIAGNOSIS — J449 Chronic obstructive pulmonary disease, unspecified: Secondary | ICD-10-CM | POA: Diagnosis present

## 2021-06-09 DIAGNOSIS — H5462 Unqualified visual loss, left eye, normal vision right eye: Secondary | ICD-10-CM | POA: Diagnosis present

## 2021-06-09 DIAGNOSIS — E1165 Type 2 diabetes mellitus with hyperglycemia: Secondary | ICD-10-CM | POA: Diagnosis not present

## 2021-06-09 DIAGNOSIS — N179 Acute kidney failure, unspecified: Secondary | ICD-10-CM | POA: Diagnosis present

## 2021-06-09 DIAGNOSIS — K56600 Partial intestinal obstruction, unspecified as to cause: Secondary | ICD-10-CM | POA: Diagnosis present

## 2021-06-09 DIAGNOSIS — G40A09 Absence epileptic syndrome, not intractable, without status epilepticus: Secondary | ICD-10-CM | POA: Diagnosis present

## 2021-06-09 DIAGNOSIS — M1990 Unspecified osteoarthritis, unspecified site: Secondary | ICD-10-CM | POA: Diagnosis present

## 2021-06-09 DIAGNOSIS — I129 Hypertensive chronic kidney disease with stage 1 through stage 4 chronic kidney disease, or unspecified chronic kidney disease: Secondary | ICD-10-CM | POA: Diagnosis present

## 2021-06-09 DIAGNOSIS — F331 Major depressive disorder, recurrent, moderate: Secondary | ICD-10-CM | POA: Diagnosis present

## 2021-06-09 DIAGNOSIS — R140 Abdominal distension (gaseous): Secondary | ICD-10-CM

## 2021-06-09 DIAGNOSIS — G4733 Obstructive sleep apnea (adult) (pediatric): Secondary | ICD-10-CM | POA: Diagnosis present

## 2021-06-09 DIAGNOSIS — K5000 Crohn's disease of small intestine without complications: Secondary | ICD-10-CM | POA: Diagnosis present

## 2021-06-09 DIAGNOSIS — K567 Ileus, unspecified: Secondary | ICD-10-CM | POA: Diagnosis present

## 2021-06-09 DIAGNOSIS — Z87891 Personal history of nicotine dependence: Secondary | ICD-10-CM | POA: Diagnosis not present

## 2021-06-09 DIAGNOSIS — E785 Hyperlipidemia, unspecified: Secondary | ICD-10-CM | POA: Diagnosis present

## 2021-06-09 DIAGNOSIS — K529 Noninfective gastroenteritis and colitis, unspecified: Secondary | ICD-10-CM | POA: Diagnosis present

## 2021-06-09 DIAGNOSIS — E11649 Type 2 diabetes mellitus with hypoglycemia without coma: Secondary | ICD-10-CM | POA: Diagnosis present

## 2021-06-09 DIAGNOSIS — I2510 Atherosclerotic heart disease of native coronary artery without angina pectoris: Secondary | ICD-10-CM | POA: Diagnosis present

## 2021-06-09 DIAGNOSIS — N182 Chronic kidney disease, stage 2 (mild): Secondary | ICD-10-CM | POA: Diagnosis present

## 2021-06-09 DIAGNOSIS — Z6841 Body Mass Index (BMI) 40.0 and over, adult: Secondary | ICD-10-CM | POA: Diagnosis not present

## 2021-06-09 DIAGNOSIS — Z885 Allergy status to narcotic agent status: Secondary | ICD-10-CM | POA: Diagnosis not present

## 2021-06-09 LAB — GASTROINTESTINAL PANEL BY PCR, STOOL (REPLACES STOOL CULTURE)

## 2021-06-09 LAB — BASIC METABOLIC PANEL
Anion gap: 9 (ref 5–15)
BUN: 27 mg/dL — ABNORMAL HIGH (ref 6–20)
CO2: 28 mmol/L (ref 22–32)
Calcium: 8.9 mg/dL (ref 8.9–10.3)
Chloride: 103 mmol/L (ref 98–111)
Creatinine, Ser: 1.47 mg/dL — ABNORMAL HIGH (ref 0.44–1.00)
GFR, Estimated: 42 mL/min — ABNORMAL LOW (ref >60)
Glucose, Bld: 226 mg/dL — ABNORMAL HIGH (ref 70–99)
Potassium: 5.8 mmol/L — ABNORMAL HIGH (ref 3.5–5.1)
Sodium: 140 mmol/L (ref 135–145)

## 2021-06-09 LAB — URINALYSIS, ROUTINE W REFLEX MICROSCOPIC
Bilirubin Urine: NEGATIVE
Glucose, UA: NEGATIVE mg/dL
Hgb urine dipstick: NEGATIVE
Ketones, ur: NEGATIVE mg/dL
Nitrite: NEGATIVE
Protein, ur: 30 mg/dL — AB
Specific Gravity, Urine: >1.046 — ABNORMAL HIGH (ref 1.005–1.030)
pH: 5 (ref 5.0–8.0)

## 2021-06-09 LAB — GLUCOSE, CAPILLARY
Glucose-Capillary: 139 mg/dL — ABNORMAL HIGH (ref 70–99)
Glucose-Capillary: 156 mg/dL — ABNORMAL HIGH (ref 70–99)
Glucose-Capillary: 166 mg/dL — ABNORMAL HIGH (ref 70–99)
Glucose-Capillary: 183 mg/dL — ABNORMAL HIGH (ref 70–99)

## 2021-06-09 LAB — CBC
HCT: 42.9 % (ref 36.0–46.0)
Hemoglobin: 13.1 g/dL (ref 12.0–15.0)
MCH: 28.7 pg (ref 26.0–34.0)
MCHC: 30.5 g/dL (ref 30.0–36.0)
MCV: 93.9 fL (ref 80.0–100.0)
Platelets: 357 10*3/uL (ref 150–400)
RBC: 4.57 MIL/uL (ref 3.87–5.11)
RDW: 14.4 % (ref 11.5–15.5)
WBC: 16.5 10*3/uL — ABNORMAL HIGH (ref 4.0–10.5)
nRBC: 0 % (ref 0.0–0.2)

## 2021-06-09 LAB — C DIFFICILE QUICK SCREEN W PCR REFLEX
C Diff antigen: NEGATIVE
C Diff interpretation: NOT DETECTED
C Diff toxin: NEGATIVE

## 2021-06-09 LAB — POTASSIUM
Potassium: 5.6 mmol/L — ABNORMAL HIGH (ref 3.5–5.1)
Potassium: 5 mmol/L (ref 3.5–5.1)

## 2021-06-09 LAB — CBG MONITORING, ED
Glucose-Capillary: 202 mg/dL — ABNORMAL HIGH (ref 70–99)
Glucose-Capillary: 222 mg/dL — ABNORMAL HIGH (ref 70–99)
Glucose-Capillary: 190 mg/dL — ABNORMAL HIGH (ref 70–99)

## 2021-06-09 MED ORDER — INSULIN ASPART 100 UNIT/ML IJ SOLN
0.0000 [IU] | Freq: Every day | INTRAMUSCULAR | Status: DC
  Administered 2021-06-09: 2 [IU] via SUBCUTANEOUS
  Filled 2021-06-09: qty 1

## 2021-06-09 MED ORDER — LEVETIRACETAM IN NACL 500 MG/100ML IV SOLN: 500.0000 mg | Freq: Every day | INTRAVENOUS | Status: DC

## 2021-06-09 MED ORDER — INSULIN ASPART 100 UNIT/ML IJ SOLN
0.0000 [IU] | Freq: Three times a day (TID) | INTRAMUSCULAR | Status: DC
  Administered 2021-06-09: 4 [IU] via SUBCUTANEOUS
  Administered 2021-06-09: 7 [IU] via SUBCUTANEOUS
  Filled 2021-06-09 (×2): qty 1

## 2021-06-09 MED ORDER — INSULIN ASPART 100 UNIT/ML IJ SOLN
0.0000 [IU] | INTRAMUSCULAR | Status: DC
  Administered 2021-06-09: 4 [IU] via SUBCUTANEOUS
  Administered 2021-06-09: 3 [IU] via SUBCUTANEOUS
  Administered 2021-06-09 – 2021-06-10 (×3): 4 [IU] via SUBCUTANEOUS
  Administered 2021-06-10: 3 [IU] via SUBCUTANEOUS
  Filled 2021-06-09 (×6): qty 1

## 2021-06-09 MED ORDER — HYDROMORPHONE HCL 1 MG/ML IJ SOLN
1.0000 mg | Freq: Once | INTRAMUSCULAR | Status: AC
  Administered 2021-06-09: 1 mg via INTRAVENOUS
  Filled 2021-06-09: qty 1

## 2021-06-09 MED ORDER — INSULIN GLARGINE-YFGN 100 UNIT/ML ~~LOC~~ SOLN
5.0000 [IU] | Freq: Two times a day (BID) | SUBCUTANEOUS | Status: DC
  Administered 2021-06-09 – 2021-06-12 (×7): 5 [IU] via SUBCUTANEOUS
  Filled 2021-06-09 (×7): qty 0.05

## 2021-06-09 MED ORDER — ONDANSETRON HCL 4 MG/2ML IJ SOLN
4.0000 mg | Freq: Four times a day (QID) | INTRAMUSCULAR | Status: AC | PRN
  Administered 2021-06-09: 4 mg via INTRAVENOUS
  Filled 2021-06-09: qty 2

## 2021-06-09 MED ORDER — LEVETIRACETAM 750 MG PO TABS
750.0000 mg | ORAL_TABLET | Freq: Every day | ORAL | Status: DC
  Administered 2021-06-09 – 2021-06-18 (×10): 750 mg via ORAL
  Filled 2021-06-09 (×10): qty 1

## 2021-06-09 MED ORDER — GABAPENTIN 400 MG PO CAPS
1200.0000 mg | ORAL_CAPSULE | Freq: Every day | ORAL | Status: DC
  Administered 2021-06-10 – 2021-06-18 (×9): 1200 mg via ORAL
  Filled 2021-06-09 (×9): qty 3

## 2021-06-09 MED ORDER — LEVETIRACETAM 500 MG PO TABS
500.0000 mg | ORAL_TABLET | Freq: Every day | ORAL | Status: DC
  Administered 2021-06-09 – 2021-06-19 (×11): 500 mg via ORAL
  Filled 2021-06-09 (×11): qty 1

## 2021-06-09 MED ORDER — ONDANSETRON HCL 4 MG PO TABS: 4.0000 mg | ORAL_TABLET | Freq: Four times a day (QID) | ORAL | Status: AC | PRN

## 2021-06-09 MED ORDER — DEXTROSE 50 % IV SOLN: 1.0000 | INTRAVENOUS | Status: AC | PRN

## 2021-06-09 MED ORDER — GABAPENTIN 300 MG PO CAPS
600.0000 mg | ORAL_CAPSULE | Freq: Two times a day (BID) | ORAL | Status: DC
  Administered 2021-06-09 – 2021-06-19 (×20): 600 mg via ORAL
  Filled 2021-06-09 (×20): qty 2

## 2021-06-09 MED ORDER — HEPARIN SODIUM (PORCINE) 5000 UNIT/ML IJ SOLN
5000.0000 [IU] | Freq: Three times a day (TID) | INTRAMUSCULAR | Status: DC
  Administered 2021-06-09 – 2021-06-10 (×5): 5000 [IU] via SUBCUTANEOUS
  Filled 2021-06-09 (×5): qty 1

## 2021-06-09 MED ORDER — SODIUM CHLORIDE 0.9 % IV SOLN: 750.0000 mg | Freq: Every day | INTRAVENOUS | Status: DC

## 2021-06-09 MED ORDER — SODIUM CHLORIDE 0.9 % IV BOLUS
500.0000 mL | Freq: Once | INTRAVENOUS | Status: AC
  Administered 2021-06-09: 500 mL via INTRAVENOUS

## 2021-06-09 MED ORDER — PROCHLORPERAZINE EDISYLATE 10 MG/2ML IJ SOLN
10.0000 mg | Freq: Four times a day (QID) | INTRAMUSCULAR | Status: DC | PRN
  Administered 2021-06-09 – 2021-06-15 (×2): 10 mg via INTRAVENOUS
  Filled 2021-06-09 (×2): qty 2

## 2021-06-09 MED ORDER — PANTOPRAZOLE SODIUM 40 MG IV SOLR
40.0000 mg | Freq: Two times a day (BID) | INTRAVENOUS | Status: AC
  Administered 2021-06-09 – 2021-06-12 (×8): 40 mg via INTRAVENOUS
  Filled 2021-06-09 (×8): qty 10

## 2021-06-09 NOTE — Consult Note (Signed)
[facility] SURGICAL CONSULTATION NOTE (initial) - cpt: 93716   HISTORY OF PRESENT ILLNESS (HPI):  56 y.o. female presented to [facility] ED yesterday for evaluation of weakness. Patient is well known to our service secondary to two admission in April for possible SBO vs ileus. Both times, she responded well to conservative measures with NGT decompression. Previous images did not reveal any clear transition points,. But she does have risk factors for SBO given previous surgeries. She was seen as an outpatient on 05/19 by Dr Hampton Abbot and MRI Enterography was ordered. Unfortunately, she reports that she started to feel bad over the weekend. She reports generalized weakness, decreased appetite, nausea, emesis, and abdominal distension. No fever, chills, cough, CP, SOB, or urinary changes. She continues to have diarrhea, which has been consistent with her previous presentations as well. Again, previous surgical history is positive for ventral hernia repair, BTL, and vaginal hysterectomy. Work up in the ED revealed leukocytosis to 12.3K and Hgb to 15.1 (suspect this is hemoconcentration), AKI with sCr - 1.64 (baseline 1.0 - 1.3), hypermagnesemia to 2.5. CT Abdomen/Pelvis was again obtained which shows gastric and proximal small bowel dilation without clear nor abrupt transition point concerning for SBO vs ileus. She was admitted to the medicine service. NGT was placed this morning. I did see her at bedside at time of confirmation KUB. NGT was in distal esophagus and at 45 cm in the left nare. I did advance this with RN to 60 cm and confirmed with ausculation.   Surgery is consulted by hospitalist physician Dr. Lorella Nimrod, MD in this context for evaluation and management of possible SBPO vs Ileus.   PAST MEDICAL HISTORY (PMH):  Past Medical History:  Diagnosis Date   Anxiety and depression 09/13/2006   Qualifier: Diagnosis of  By: Hassell Done [name], Nykedtra     Arthritis    joint pain    Asthma     COPD (chronic obstructive pulmonary disease) ([place])    chronic bronchitis    Depression    Diabetes mellitus    since age 73; type 2 IDDM   Diabetic neuropathy, painful ([place])    FEET AND HANDS   Diabetic retinopathy ([place])    Diastolic dysfunction    a.  Echo 11/17: EF 60-65%, mild LVH, no RWMA, Gr1DD, mild AI, dilated aortic root measuring 38 mm, mildly dilated ascending aorta   Dilated aortic root (HCC)    16m by echo 11/2015   Dizziness    secondary to diabetes and hypertension    Epilepsy idiopathic petit mal ([place]    last seizure 2012;controlled w/ topomax   GERD (gastroesophageal reflux disease)    Glaucoma    NOT ON ANY EYE DROPS    Headache(784.0)    migraines    Heart murmur    born with    History of stress test    a. 11/17: Normal perfusion, EF 53%, normal study   Hx of blood clots    hematomas removed from left side of brain from [id number]    Hyperlipidemia    Hypertension    Legally blind    left eye    MIGRAINE HEADACHE 09/14/2006   Qualifier: Diagnosis of  By: MarHassell DoneP, Nykedtra     [facility] ([facility]  [facility].  Patient reported history of without objective documentation   Nephrolithiasis    frequent urination , urination at nite  PT SEEN IN ER 09/22/11 FOR BACK AND RT SIDED PAIN--HAS KNOWN STONE  RT URETER AND UA IN ER SHOWED UTI   Pain 09/23/11   LOWER BACK AND RIGHT SIDE--PT HAS RIGHT URETERAL STONE   Pneumonia    hx of 2009   Pyelonephritis    Seizures ([place])    [facility] ap[facility]    [facility];does not [facility] ; mild   Stress incontinence    Stroke ([place])    last 2003  RESIDUAL LEFT LEG WEAKNESS--NO OTHER RESIDUAL PROBLEMS     PAST SURGICAL HISTORY ([place]):  Past Surgical History:  Procedure Laterality Date   BRAIN HEMATOMA EVACUATION     five procedures total, first procedure when 50 months old, last at 56 years of age.   CYSTOSCOPY W/ RETROGRADES  09/24/2011   Procedure: CYSTOSCOPY WITH RETROGRADE PYELOGRAM;  Surgeon: Malka So, MD;  Location: [facility];  Service: Urology;  Laterality: Right;   CYSTOSCOPY WITH URETEROSCOPY  09/24/2011   Procedure: CYSTOSCOPY WITH URETEROSCOPY;  Surgeon: Malka So, MD;  Location: [facility];  Service: Urology;  Laterality: Right;  Balloon dilation right ureter    CYSTOSCOPY/RETROGRADE/URETEROSCOPY  08/24/2011   Procedure: CYSTOSCOPY/RETROGRADE/URETEROSCOPY;  Surgeon: Molli Hazard, MD;  Location: [facility];  Service: Urology;  Laterality: Right;  Cysto, Right retrograde Pyelogram, right stent placement.    INCISIONAL HERNIA REPAIR  05/05/2011   Procedure: LAPAROSCOPIC INCISIONAL HERNIA;  Surgeon: Rolm Bookbinder, MD;  Location: [place];  Service: General;  Laterality: N/A;   kidney stone removal     MASS EXCISION  05/05/2011   Procedure: EXCISION MASS;  Surgeon: Rolm Bookbinder, MD;  Location: [place];  Service: General;  Laterality: Right;   PC[facility]     RETINAL DETACHMENT SURGERY Left 1990   RIGHT/LEFT HEART CATH AND CORONARY ANGIOGRAPHY N/A 04/28/2019   Procedure: RIGHT/LEFT HEART CATH AND CORONARY ANGIOGRAPHY;  Surgeon: Yolonda Kida, MD;  Location: [place] [facility];  Service: Cardiovascular;  Laterality: N/A;   SHUNT REMOVAL     shunt inserted at age 89 removed at age 38    Causey:  Prior to Admission medications   Medication Sig Start Date End Date Taking? Authorizing Provider  amitriptyline (ELAVIL) 25 MG tablet Take 2-3 tablets (50-75 mg total) by mouth at bedtime. 01/07/21 07/06/21 Yes Gillis Santa, MD  bisacodyl (DULCOLAX) 5 MG EC tablet Take 1 tablet (5 mg total) by mouth daily. 05/24/21  Yes Nicole Kindred A, DO  budesonide (PULMICORT) 0.5 MG/2ML nebulizer solution Inhale 0.5 mg into the lungs 2 (two) times daily.   Yes Ottie Glazier, MD  carvedilol (COREG) 25 MG tablet Take 25 mg by mouth 2 (two) times daily. 03/06/21  Yes [provider]  cholecalciferol (VITAMIN D3) 25 MCG (1000 UNIT) tablet Take 1,000 Units by mouth  daily.   Yes [provider]  ciclopirox (PENLAC) 8 % solution Apply topically at bedtime. 02/01/21  Yes [provider]  clopidogrel (PLAVIX) 75 MG tablet Take 1 tablet (75 mg total) by mouth daily. 10/10/19  Yes Towanda Malkin, MD  Continuous Blood Gluc Sensor (FREESTYLE LIBRE 2 SENSOR) MISC Change every 14 days 05/14/21  Yes Elayne Snare, MD  cycloSPORINE (RESTASIS) 0.05 % ophthalmic emulsion Place 2 drops into both eyes 2 (two) times daily.   Yes [provider]  Dulaglutide (TRULICITY) 4.5 BV/6.9IH SOPN Inject 4.5 mg as directed once a week. 10/22/20  Yes Elayne Snare, MD  DULoxetine (CYMBALTA) 60 MG capsule Take 1 capsule (60 mg total) by  mouth daily. 02/14/21  Yes Bo Merino, [name]  ezetimibe (ZETIA) 10 MG tablet Take 1 tablet (10 mg total) by mouth daily. 04/14/21  Yes Bo Merino, [name]  fluticasone (FLONASE) 50 MCG/ACT nasal spray Place 2 sprays into both nostrils daily. 04/18/19  Yes Towanda Malkin, MD  gabapentin (NEURONTIN) 600 MG tablet 600 mg qAM, 600 mg qPM, 1200 mg QHS 01/07/21  Yes Lateef, Bilal, MD  hydrALAZINE (APRESOLINE) 50 MG tablet Take 1 tablet by mouth 3 (three) times daily. 01/31/21  Yes [provider]  Insulin Disposable Pump (OMNIPOD DASH PODS, GEN 4,) MISC Change pod once daily 01/28/21  Yes Elayne Snare, MD  insulin lispro (HUMALOG KWIKPEN) 200 UNIT/ML KwikPen Inject up to 160 units daily in divided doses as directed 03/26/21  Yes Elayne Snare, MD  Insulin Pen Needle (PEN NEEDLES) 32G X 4 MM MISC Use with Humalog to fill pump daily 05/14/21  Yes Elayne Snare, MD  levETIRAcetam (KEPPRA) 500 MG tablet Take 500-750 mg by mouth See admin instructions. Take 1 tablet (522m) by mouth every morning and take 1 tablets (7547m by mouth every night   Yes [provider]  mirabegron ER (MYRBETRIQ) 50 MG TB24 tablet Take 1 tablet (50 mg total) by mouth daily. 08/20/20  Yes RuMyles GipDO  olmesartan-hydrochlorothiazide  (BENICAR HCT) 40-25 MG tablet TAKE 1 TABLET BY MOUTH EVERY DAY 02/21/21  Yes TaDelsa GranaPA-C  pantoprazole (PROTONIX) 20 MG tablet Take 1 tablet (20 mg total) by mouth daily. 02/14/21  Yes PeBo MerinoFNP  potassium chloride SA (KLOR-CON M20) 20 MEQ tablet You have not needed potassium while in the hospital.  Hold until outpatient followup. Patient taking differently: Take 20 mEq by mouth 2 (two) times daily. 05/21/19  Yes LaEnzo BiMD  rosuvastatin (CRESTOR) 10 MG tablet Take 1 tablet (10 mg total) by mouth daily. 02/14/21  Yes PeBo MerinoFNP  senna-docusate (SENOKOT-S) 8.6-50 MG tablet Take 1 tablet by mouth 2 (two) times daily. 05/23/21  Yes GrNicole Kindred, DO  spironolactone (ALDACTONE) 25 MG tablet Take 25 mg by mouth daily.   Yes [provider]  WIGrant RutsNHUB 250-50 MCG/DOSE AEPB Inhale 1 puff into the lungs 2 (two) times daily. 01/22/20  Yes [provider]  albuterol (ACCUNEB) 1.25 MG/3ML nebulizer solution Inhale 1 ampule into the lungs every 6 (six) hours as needed for wheezing or shortness of breath.    [provider]  albuterol (PROAIR HFA) 108 (90 Base) MCG/ACT inhaler INHALE 2 PUFFS BY MOUTH EVERY 6 HOURS AS NEEDED FOR WHEEZING OR SHORTNESS OF BREATH 01/24/18   BoHubbard HartshornFNP  baclofen (LIORESAL) 10 MG tablet Take 1 tablet (10 mg total) by mouth 3 (three) times daily as needed for muscle spasms. 06/04/21   TaDelsa GranaPA-C  butalbital-acetaminophen-caffeine (FIORICET) [phone] tablet Take 1-2 tablets by mouth every 6 (six) hours as needed for headache. 03/10/21 03/10/22  LaGillis SantaMD  EPINEPHrine 0.3 mg/0.3 mL IJ SOAJ injection Inject 0.3 mg into the muscle as needed for anaphylaxis. 03/29/19   [provider]  meclizine (ANTIVERT) 25 MG tablet Take 1 tablet (25 mg total) by mouth 2 (two) times daily as needed for dizziness. 05/23/21   GrEzekiel SlocumbDO  nitroGLYCERIN (NITROSTAT) 0.4 MG SL tablet Place 0.4 mg under the tongue every  5 (five) minutes as needed for chest pain.    [provider]  ondansetron (ZOFRAN-ODT) 8 MG disintegrating tablet Take 1  tablet (8 mg total) by mouth every 8 (eight) hours as needed for nausea or vomiting. 05/23/21   Nicole Kindred A, DO  polyethylene glycol (MIRALAX / GLYCOLAX) 17 g packet Take 17 g by mouth daily as needed for mild constipation. 06/01/19   Delsa Grana, [name]  sodium phosphate (FLEET) 7-19 GM/118ML ENEM Place 133 mLs (1 enema total) rectally daily as needed for severe constipation. 05/24/21   Ezekiel Slocumb, DO     ALLERGIES:  Allergies  Allergen Reactions   Codeine Swelling and Other (See Comments)    Swelling and burning of mouth (inside)   Lactose Intolerance (Gi) Nausea And Vomiting     SOCIAL HISTORY:  Social History   Socioeconomic History   Marital status: Widowed    Spouse name: Not on file   Number of children: 2   Years of education: Not on file   Highest education level: Not on file  Occupational History   Occupation: disabled  Tobacco Use   Smoking status: Former    Packs/day: 1.00    Years: 15.00    Pack years: 15.00    Types: Cigarettes    Quit date: 01/20/1987    Years since quitting: 34.4   Smokeless tobacco: Never   Tobacco comments:    quit more than 20 years  Vaping Use   Vaping Use: Never used  Substance and Sexual Activity   Alcohol use: Yes    Alcohol/week: 1.0 standard drink    Types: 1 Glasses of wine per week   Drug use: No    Comment: hx of marijuana use no longer uses    Sexual activity: Yes    Partners: Male    Birth control/protection: Surgical  Other Topics Concern   Not on file  Social History Narrative   Pt lives with her daughter   Social Determinants of Health   Financial Resource Strain: Low Risk    Difficulty of Paying Living Expenses: Not very hard  Food Insecurity: No Food Insecurity   Worried About Charity fundraiser in the Last Year: Never true   Ran Out of Food in the Last Year: Never true   Transportation Needs: No Transportation Needs   Lack of Transportation (Medical): No   Lack of Transportation (Non-Medical): No  Physical Activity: Insufficiently Active   Days of Exercise per Week: 2 days   Minutes of Exercise per Session: 10 min  Stress: No Stress Concern Present   Feeling of Stress : Not at all  Social Connections: Socially Isolated   Frequency of Communication with Friends and Family: More than three times a week   Frequency of Social Gatherings with Friends and Family: More than three times a week   Attends Religious Services: Never   Marine scientist or Organizations: No   Attends Archivist Meetings: Never   Marital Status: Widowed  Human resources officer Violence: Not At Risk   Fear of Current or Ex-Partner: No   Emotionally Abused: No   Physically Abused: No   Sexually Abused: No     FAMILY HISTORY:  Family History  Problem Relation Age of Onset   Uterine cancer Mother    Hypertension Mother    Cancer Mother    Brain cancer Maternal Grandmother    Hypertension Maternal Grandmother    Birth defects Daughter    Hypertension Daughter    Cirrhosis Maternal Grandfather    ADD / ADHD Maternal Grandfather    Birth defects Maternal Grandfather  Diabetes Maternal Grandfather    Anesthesia problems Neg Hx    Colon cancer Neg Hx    Esophageal cancer Neg Hx    Pancreatic cancer Neg Hx       REVIEW OF SYSTEMS:  Review of Systems  Constitutional:  Positive for malaise/fatigue. Negative for chills and fever.  Respiratory:  Negative for cough and shortness of breath.   Cardiovascular:  Negative for chest pain and palpitations.  Gastrointestinal:  Positive for abdominal pain, diarrhea, nausea and vomiting. Negative for blood in stool and constipation.  Genitourinary:  Negative for dysuria and urgency.  Neurological:  Positive for weakness. Negative for dizziness.  All other systems reviewed and are negative.  VITAL SIGNS:  Temp:  [98.5 F  (36.9 C)] 98.5 F (36.9 C) (05/21 2010) Pulse Rate:  [96-132] 96 (05/22 0849) Resp:  [15-37] 16 (05/22 0849) BP: (132-172)/(91-115) 172/101 (05/22 0843) SpO2:  [93 %-100 %] 93 % (05/22 0849) Weight:  [116.1 kg] 116.1 kg (05/21 2009)     Height: 5' 6.5" (168.9 cm) Weight: 116.1 kg BMI (Calculated): 40.71   INTAKE/OUTPUT:  05/21 0701 - 05/22 0700 In: 1000 [IV Piggyback:1000] Out: -   PHYSICAL EXAM:  Physical Exam Vitals and nursing note reviewed. Exam conducted with a chaperone present.  Constitutional:      General: She is not in acute distress.    Appearance: Normal appearance. She is not ill-appearing.  HENT:     Head: Normocephalic and atraumatic.     Nose:     Comments: NGT in left nare; advanced with RN assistance  Eyes:     General: No scleral icterus.    Conjunctiva/sclera: Conjunctivae normal.  Cardiovascular:     Rate and Rhythm: Normal rate.     Pulses: Normal pulses.     Heart sounds: No murmur heard. Pulmonary:     Effort: Pulmonary effort is normal. No respiratory distress.  Abdominal:     General: Abdomen is protuberant. There is distension.     Palpations: Abdomen is soft.     Tenderness: There is no abdominal tenderness. There is no guarding or rebound.     Comments: Abdomen is obese, certainly distended and tympanic, she does not appearing overtly tender, certainly no guarding or rebound. No evidence of peritonitis   Genitourinary:    Comments: Deferred Musculoskeletal:     Right lower leg: No edema.     Left lower leg: No edema.  Skin:    General: Skin is warm and dry.     Findings: No erythema or rash.  Neurological:     General: No focal deficit present.     Mental Status: She is alert and oriented to person, place, and time.  Psychiatric:        Mood and Affect: Mood normal.        Behavior: Behavior normal.     Labs:     Latest Ref Rng & Units 06/09/2021    4:45 AM 06/08/2021    8:13 PM 06/02/2021   11:29 AM  CBC  WBC 4.0 - 10.5 K/uL  16.5   12.3   7.5    Hemoglobin 12.0 - 15.0 g/dL 13.1   15.1   12.4    Hematocrit 36.0 - 46.0 % 42.9   48.9   38.6    Platelets 150 - 400 K/uL 357   385   319        Latest Ref Rng & Units 06/09/2021    5:25 AM 06/09/2021  4:45 AM 06/08/2021    8:13 PM  CMP  Glucose 70 - 99 mg/dL  226   117    BUN 6 - 20 mg/dL  27   22    Creatinine 0.44 - 1.00 mg/dL  1.47   1.64    Sodium 135 - 145 mmol/L  140   141    Potassium 3.5 - 5.1 mmol/L 5.6   5.8   4.4    Chloride 98 - 111 mmol/L  103   100    CO2 22 - 32 mmol/L  28   28    Calcium 8.9 - 10.3 mg/dL  8.9   10.7    Total Protein 6.5 - 8.1 g/dL   9.6    Total Bilirubin 0.3 - 1.2 mg/dL   0.7    Alkaline Phos 38 - 126 U/L   92    AST 15 - 41 U/L   24    ALT 0 - 44 U/L   25       Imaging studies:   CT Abdomen/Pelvis (06/08/2021) personally reviewed which again shows gastric and small bowel dilation without clear nor abrupt transition, and radiologist report reviewed below:  IMPRESSION: Diffusely dilated and fluid-filled stomach and proximal small bowel with gradual transition to more normal caliber terminal ileum. Additional fluid-filled proximal colon. The findings can be seen in the setting of an underlying infectious or inflammatory enterocolitis appears similar to numerous prior examinations. Mild mesenteric edema. No free air.   Incomplete duodenal rotation.   Fluid within the distal esophagus likely related to gastroesophageal reflux.   Stable 2.7 cm right adrenal adenoma.   Mild bilateral nonobstructing nephrolithiasis.   Assessment/Plan: (ICD-10's: K60.609) 56 y.o. female with now her third episode of abdominal distension, nausea, emesis, and diarrhea in the last 2 months concerning for possible recurrent SBO vs Ileus.   - Appreciate medicine admission  - Unfortunately, this is now her third presentation in the last 2 months for the same. Her presentation has never been "clear cut" for obstruction given last of abrupt  transition on imaging and her continued bowel function/diarrhea. However, given her frequent recurrence, I do think she will need explored this admission. Timing of this is still unclear given OR schedule and availability. She is non-toxic and there is no evidence of peritonitis, bowel compromise, etc to warrant emergent intervention. Patient understands and is agreeable  - For now, recommend NGT decompression. I did see her at bedside at time of confirmation KUB. NGT was in distal esophagus and at 45 cm in the left nare. I did advance this with RN to 60 cm and confirmed with ausculation.   - NPO + IVF support  - Monitor abdominal examination; on-going bowel function  - Serial KUBs as needed  - Pain control prn; antiemetics prn   - Mobilization as tolerated  - Further management per primary service; we will follow   All of the above findings and recommendations were discussed with the patient, and all of patient's questions were answered to her expressed satisfaction.  Thank you for the opportunity to participate in this patient's care.   -- Edison Simon, [name] [facility] 06/09/2021, 9:24 AM M-F: 7am - 4pm

## 2021-06-09 NOTE — Assessment & Plan Note (Signed)
-   Resumed home Keppra 500 mg daily, 750 mg nightly; gabapentin 600 mg twice daily, 1200 mg nightly - Fall precautions

## 2021-06-09 NOTE — Plan of Care (Signed)
  Problem: Education: Goal: Knowledge of General Education information will improve Description: Including pain rating scale, medication(s)/side effects and non-pharmacologic comfort measures 06/09/2021 1503 by Evelena Peat, RN Outcome: Progressing 06/09/2021 1455 by Evelena Peat, RN Outcome: Progressing   Problem: Health Behavior/Discharge Planning: Goal: Ability to manage health-related needs will improve 06/09/2021 1503 by Evelena Peat, RN Outcome: Progressing 06/09/2021 1455 by Evelena Peat, RN Outcome: Progressing   Problem: Clinical Measurements: Goal: Ability to maintain clinical measurements within normal limits will improve 06/09/2021 1503 by Evelena Peat, RN Outcome: Progressing 06/09/2021 1455 by Evelena Peat, RN Outcome: Progressing Goal: Will remain free from infection 06/09/2021 1503 by Evelena Peat, RN Outcome: Progressing 06/09/2021 1455 by Evelena Peat, RN Outcome: Progressing Goal: Diagnostic test results will improve 06/09/2021 1503 by Evelena Peat, RN Outcome: Progressing 06/09/2021 1455 by Evelena Peat, RN Outcome: Progressing Goal: Respiratory complications will improve 06/09/2021 1503 by Evelena Peat, RN Outcome: Progressing 06/09/2021 1455 by Evelena Peat, RN Outcome: Progressing Goal: Cardiovascular complication will be avoided 06/09/2021 1503 by Evelena Peat, RN Outcome: Progressing 06/09/2021 1455 by Evelena Peat, RN Outcome: Progressing

## 2021-06-09 NOTE — ED Notes (Signed)
Patient placed on hospital bed for comfort °

## 2021-06-09 NOTE — Assessment & Plan Note (Addendum)
Some improvement to creatinine, still not at baseline which is around 1. - Presumed secondary to prerenal in setting of poor p.o. intake in setting of nausea and vomiting and diarrhea - Monitor renal function -Avoid nephrotoxins.

## 2021-06-09 NOTE — Progress Notes (Addendum)
       CROSS COVER NOTE  NAME: [name] MRN: [id number] DOB : 1965/08/11    Date of Service   06/09/21  HPI/Events of Note   Secure chat received from nursing reporting K of 5.8.  Interventions   Plan: Repeat K, lab commented that hemolysis might be impacting result EKG- peaked t-waves improved from prior Repeat K was 5.6, 522m NS bolus ordered       KAlesia Banda MHA, [name]-BC Nurse Practitioner [facility] HospitalPager ([phone]

## 2021-06-09 NOTE — Assessment & Plan Note (Signed)
Estimated body mass index is 40.7 kg/m as calculated from the following:   Height as of this encounter: 5' 6.5" (1.689 m).   Weight as of this encounter: 116.1 kg.  This will complicate overall prognosis.

## 2021-06-09 NOTE — Assessment & Plan Note (Addendum)
Blood pressure mildly elevated. -Continue home dose of carvedilol and hydralazine. -Hold spironolactone due to hyperkalemia -Holding home Benicar for AKI

## 2021-06-09 NOTE — Progress Notes (Signed)
Progress Note   Patient: [name] FXT:[id number] DOB: May 26, 1965 DOA: 06/08/2021     0 DOS: the patient was seen and examined on 06/09/2021   Brief hospital course: Ms. Tyannah Sane is a 56 year old female with history of hypertension, hyperlipidemia, multiple hospitalization for small bowel obstructions, history of incisional hernia repair in 2013, anxiety, depression, asthma, history of idiopathic epilepsy/petit mall with last seizure being in 2012, controlled with Topamax, GERD, left eye legally blindness, sleep apnea who does not use CPAP at home, stress incontinence, who presents emergency department for chief concerns of generalized weakness, decreased appetite, nausea, vomiting.  Initial vitals in the emergency department showed temperature of 98.5, respiration rate of 33, heart rate of 121, blood pressure 147/105, SPO2 of 100% on room air.  Serum sodium is 141, potassium 4.4, chloride 100, bicarb 28, BUN of 22, serum creatinine of 1.64, nonfasting blood glucose 117, GFR of 37.  Magnesium is 2.5.  WBC 12.3, hemoglobin 15.1, platelets of 385.  CT abdomen pelvis with contrast: Was read as diffusely dilated and fluid-filled stomach and proximal small bowel with gradual transition to more normal caliber terminal ileum.  Fluid-filled proximal colon.  The findings can be seen in setting of underlying infectious or inflammatory enterocolitis.  Mild mesenteric edema.  No free air.  Incomplete duodenal rotation.  Mild bilateral nonobstructing nephrolithiasis.  ED treatment: Dilaudid 0.5 mg IV x2 doses, ondansetron 4 mg IV x2 doses, sodium chloride 1 L bolus.  5/22: Patient continued to have abdominal pain with nausea and vomiting.  Compazine was also added with Zofran. Patient was recently seen by general surgery and MR with enterography was ordered and scheduled for 5/28. Worsening leukocytosis. NG tube was placed with significant drainage. General surgery was also consulted for  concern of another SBO. She will be most likely going to the OR for exploratory laparotomy as this is her third admission for similar reasons in the past 17-month    Assessment and Plan: * Intractable nausea and vomiting - Multifactorial including possible small bowel obstruction/ileus in setting of GERD. -General surgery consult as she is a known patient to them. -Place NG tube on intermittent suctioning -Add Compazine with Zofran - Continue Zofran -Continue with pain management -Patient will be going to the OR with general surgery for exploratory laparotomy, no definite  schedule at this time  Hyperkalemia Potassium at 5.8 and 5.6 on repeat. Patient was given 500 cc normal saline by overnight provider. -Recheck potassium-came back at 5 -Discontinue spironolactone -Continue to monitor  [place] - Resumed home Keppra 500 mg daily, 750 mg nightly; gabapentin 600 mg twice daily, 1200 mg nightly - Fall precautions   [place]) [place] Some improvement to creatinine, still not at baseline which is around 1. - Presumed secondary to prerenal in setting of poor p.o. intake in setting of nausea and vomiting and diarrhea - Monitor renal function -Avoid nephrotoxins.  OSA (obstructive sleep apnea) - CPAP nightly ordered  Essential hypertension, benign Blood pressure mildly elevated. -Continue home dose of carvedilol and hydralazine. -Hold spironolactone due to hyperkalemia -Holding home Benicar for AKI  GERD without esophagitis - Protonix 40 mg IV twice daily  Type 2 diabetes mellitus with chronic kidney disease, with long-term current use of insulin (HCC) CBG elevated - Patient's insulin dosing is reported as insulin lispro, inject 160 units daily in divided doses as directed, Trulicity weekly injection -  SSI with at bedtime coverage -Add Semglee 5 units twice daily   Obesity, Class  III, BMI 40-49.9 (morbid obesity) ([place]) Estimated body mass index is  40.7 kg/m as calculated from the following:   Height as of this encounter: 5' 6.5" (1.689 m).   Weight as of this encounter: 116.1 kg.  This will complicate overall prognosis.  CAD (coronary artery disease) - Rosuvastatin 10 mg nightly, spironolactone 25 mg daily, carvedilol 25 mg p.o. twice daily have been resumed - I have not resumed Plavix 75 mg daily due to ileus versus small bowel obstruction - Per med reconciliation patient last dose of Plavix was 06/08/2021  Major depressive disorder, recurrent episode, moderate (HCC) - Resumed home duloxetine 60 mg daily, amitriptyline 70 to 75 mg nightly  Diarrhea - Query paradoxical diarrhea - However given recent antibiotic use for UTI, infectious diarrhea cannot be excluded at this time - GI panel, and C. difficile screen order placed  Hyperlipemia-resolved as of 06/09/2021 - Ezetimibe 10 mg daily resumed  Dyslipidemia - Ezetimibe 10 mg daily, rosuvastatin 10 mg nightly resumed  Ileus (HCC)-resolved as of 06/09/2021 - Symptomatic support, previously patient symptoms have resolved with conservative management - I have not consulted general surgery at this time - A.m. team to consult if patient's symptoms do not improve   Subjective: Patient was seen after NG tube placement and was feeling much improved.  Significant amount of dark brown secretions.  Continued to have 9 out of 10 abdominal pain and abdominal distention.  Physical Exam: Vitals:   06/09/21 1130 06/09/21 1200 06/09/21 1230 06/09/21 1320  BP: (!) 185/94 (!) 162/103 (!) 156/88 (!) 160/92  Pulse: 100 (!) 101 100 99  Resp: (!) 24 (!) 23 (!) 23 18  Temp: 98.4 F (36.9 C)  98.4 F (36.9 C) 97.9 F (36.6 C)  TempSrc: Oral  Oral Oral  SpO2: 94% 92% 94% 98%  Weight:      Height:       General.  Morbidly obese lady, in no acute distress. Pulmonary.  Lungs clear bilaterally, normal respiratory effort. CV.  Regular rate and rhythm, no JVD, rub or murmur. Abdomen.   Diffusely tender and distended abdomen, BS positive. CNS.  Alert and oriented .  No focal neurologic deficit. Extremities.  No edema, no cyanosis, pulses intact and symmetrical. Psychiatry.  Judgment and insight appears normal.  Data Reviewed: Prior notes, labs and images reviewed  Family Communication: Discussed with patient  Disposition: Status is: Inpatient Remains inpatient appropriate because: Severity of illness   Planned Discharge Destination: Home  DVT prophylaxis.  Subcu heparin Time spent: 50 minutes  This record has been created using Systems analyst. Errors have been sought and corrected,but may not always be located. Such creation errors do not reflect on the standard of care.  Author: Lorella Nimrod, MD 06/09/2021 1:24 PM  For on call review [email or web address].

## 2021-06-09 NOTE — Assessment & Plan Note (Addendum)
CBG elevated - Patient's insulin dosing is reported as insulin lispro, inject 160 units daily in divided doses as directed, Trulicity weekly injection -  SSI with at bedtime coverage -Add Semglee 5 units twice daily

## 2021-06-09 NOTE — Assessment & Plan Note (Signed)
-   Rosuvastatin 10 mg nightly, spironolactone 25 mg daily, carvedilol 25 mg p.o. twice daily have been resumed - I have not resumed Plavix 75 mg daily due to ileus versus small bowel obstruction - Per med reconciliation patient last dose of Plavix was 06/08/2021

## 2021-06-09 NOTE — Assessment & Plan Note (Addendum)
-   Multifactorial including possible small bowel obstruction/ileus in setting of GERD. -General surgery consult as she is a known patient to them. -Place NG tube on intermittent suctioning -Add Compazine with Zofran - Continue Zofran -Continue with pain management -Patient will be going to the OR with general surgery for exploratory laparotomy, no definite  schedule at this time

## 2021-06-09 NOTE — Assessment & Plan Note (Signed)
-   Protonix 40 mg IV twice daily

## 2021-06-09 NOTE — Assessment & Plan Note (Addendum)
Potassium at 5.8 and 5.6 on repeat. Patient was given 500 cc normal saline by overnight provider. -Recheck potassium-came back at 5 -Discontinue spironolactone -Continue to monitor

## 2021-06-09 NOTE — Assessment & Plan Note (Signed)
-   Ezetimibe 10 mg daily, rosuvastatin 10 mg nightly resumed

## 2021-06-09 NOTE — Assessment & Plan Note (Signed)
-   Query paradoxical diarrhea - However given recent antibiotic use for UTI, infectious diarrhea cannot be excluded at this time - GI panel, and C. difficile screen order placed

## 2021-06-09 NOTE — ED Notes (Signed)
Dr. Reesa Chew at bedside assessing patient and updating pt on plan of care.

## 2021-06-09 NOTE — Assessment & Plan Note (Signed)
-   Ezetimibe 10 mg daily resumed

## 2021-06-09 NOTE — Assessment & Plan Note (Signed)
-   Resumed home duloxetine 60 mg daily, amitriptyline 70 to 75 mg nightly

## 2021-06-09 NOTE — Assessment & Plan Note (Signed)
>>  ASSESSMENT AND PLAN FOR SEIZURE (HCC) WRITTEN ON 06/09/2021  1:56 AM BY COX, AMY N, DO  - Resumed home Keppra  500 mg daily, 750 mg nightly; gabapentin  600 mg twice daily, 1200 mg nightly - Fall precautions

## 2021-06-09 NOTE — Progress Notes (Addendum)
Inpatient Diabetes Program Recommendations  AACE/ADA: New Consensus Statement on Inpatient Glycemic Control (2015)  Target Ranges:  Prepandial:   less than 140 mg/dL      Peak postprandial:   less than 180 mg/dL (1-2 hours)      Critically ill patients:  140 - 180 mg/dL    Latest Reference Range & Units 04/26/21 16:18  Hemoglobin A1C 4.8 - 5.6 % 7.5 (H)  (H): Data is abnormally high  Latest Reference Range & Units 06/08/21 20:07 06/09/21 02:27 06/09/21 07:17  Glucose-Capillary 70 - 99 mg/dL 95 202 (H)  2 units Novolog  222 (H)  7 units Novolog   (H): Data is abnormally high    Admit with: Intractable nausea and vomiting  History: DM, CVA  Home DM Meds: Insulin Pump (Omnipod) with Humalog Insulin        Trulicity 4.5 mg Qweek        Farxiga 10 mg daily        Freestyle Libre 2 CGM  Current Orders: Novolog Resistant Correction Scale/ SSI (0-20 units) TID AC + HS   MD- Met w/ pt down in the ED.  Pt told me her Insulin Pump was removed by EMS and left at home.  Sees Dr. Dwyane Dee for Insulin pump management.  See below for most recent Insulin Pump settings.  At some point, patient may likely require basal insulin in hospital.  Gets about 127 units of a basal rate on her pump every 24 hours.    While NPO, please consider increasing the frequency of the Novolog SSI to Q4 hours   Addendum 12pm: Discussed w/ pt last visit with ENDO Dr. Dwyane Dee.  Pt told me she has not switched the Insulin in her pump to the U-200 Humalog since she still has U-100 Humalog at home.  Reminded pt to review the pump basal rate changes she needs to make when she does start the new U-200 Humalog at home--Urged pt to call her ENDO for assistance with making these changes if needed.  Pt stated understanding--Uses her pump independently at home.  Discussed with pt that since she does not have her pump with her, that the hospital team will replace the insulin likely with basal + bolus while she is off her pump and  that she should resume her pump when she goes home--Pt stated understanding and is agreeable.    Endocrinologist: Dr. Dwyane Dee with [facility] Last seen 03/26/2021 Switch to U-200 Humalog in the pump Basal rates and Sensitivity adjusted Pump Settings:  12am- 5.2 units/hr 3am- 4.8 units/hr 9am- 5.4 units/hr 3pm- 6 units/hr Carb Ratio= 1 unit for every 1 gram Carbohydrates Sensitivity= 1 unit for every 30 mg/dl > Target CBG Target CBG 120 When pt switches to U-200 Humalog, she should reduce her settings to the following:  12am- 2.6 units/hr 3am- 2.4 units/hr 9am- 2.7 units/hr 3pm- 3 units/hr AND reduce Sensitivity to 1:50    --Will follow patient during hospitalization--  Wyn Quaker RN, MSN, CDE Diabetes Coordinator Inpatient Glycemic Control Team Team Pager: [phone] (8a-5p)

## 2021-06-09 NOTE — Assessment & Plan Note (Signed)
-   CPAP nightly ordered 

## 2021-06-09 NOTE — Plan of Care (Signed)

## 2021-06-09 NOTE — Assessment & Plan Note (Signed)
>>  ASSESSMENT AND PLAN FOR MAJOR DEPRESSIVE DISORDER, RECURRENT EPISODE, MODERATE (HCC) WRITTEN ON 06/09/2021  1:56 AM BY COX, AMY N, DO  - Resumed home duloxetine  60 mg daily, amitriptyline  70 to 75 mg nightly

## 2021-06-09 NOTE — ED Notes (Signed)
[facility] notified of persistent abdominal and lower back pain 10/10 unrelieved by dilaudid and morphine.

## 2021-06-09 NOTE — Assessment & Plan Note (Signed)
-   Symptomatic support, previously patient symptoms have resolved with conservative management - I have not consulted general surgery at this time - A.m. team to consult if patient's symptoms do not improve

## 2021-06-10 ENCOUNTER — Inpatient Hospital Stay: Payer: Medicare Other

## 2021-06-10 ENCOUNTER — Inpatient Hospital Stay: Payer: Self-pay

## 2021-06-10 DIAGNOSIS — K56609 Unspecified intestinal obstruction, unspecified as to partial versus complete obstruction: Secondary | ICD-10-CM

## 2021-06-10 LAB — GLUCOSE, CAPILLARY
Glucose-Capillary: 112 mg/dL — ABNORMAL HIGH (ref 70–99)
Glucose-Capillary: 116 mg/dL — ABNORMAL HIGH (ref 70–99)
Glucose-Capillary: 124 mg/dL — ABNORMAL HIGH (ref 70–99)
Glucose-Capillary: 130 mg/dL — ABNORMAL HIGH (ref 70–99)
Glucose-Capillary: 164 mg/dL — ABNORMAL HIGH (ref 70–99)
Glucose-Capillary: 166 mg/dL — ABNORMAL HIGH (ref 70–99)
Glucose-Capillary: 171 mg/dL — ABNORMAL HIGH (ref 70–99)
Glucose-Capillary: 224 mg/dL — ABNORMAL HIGH (ref 70–99)

## 2021-06-10 LAB — BASIC METABOLIC PANEL
Anion gap: 8 (ref 5–15)
BUN: 25 mg/dL — ABNORMAL HIGH (ref 6–20)
CO2: 26 mmol/L (ref 22–32)
Calcium: 8 mg/dL — ABNORMAL LOW (ref 8.9–10.3)
Chloride: 108 mmol/L (ref 98–111)
Creatinine, Ser: 1.05 mg/dL — ABNORMAL HIGH (ref 0.44–1.00)
GFR, Estimated: >60 mL/min (ref >60)
Glucose, Bld: 189 mg/dL — ABNORMAL HIGH (ref 70–99)
Potassium: 4.2 mmol/L (ref 3.5–5.1)
Sodium: 142 mmol/L (ref 135–145)

## 2021-06-10 LAB — CBC
HCT: 32.7 % — ABNORMAL LOW (ref 36.0–46.0)
Hemoglobin: 10.1 g/dL — ABNORMAL LOW (ref 12.0–15.0)
MCH: 28.9 pg (ref 26.0–34.0)
MCHC: 30.9 g/dL (ref 30.0–36.0)
MCV: 93.7 fL (ref 80.0–100.0)
Platelets: 268 10*3/uL (ref 150–400)
RBC: 3.49 MIL/uL — ABNORMAL LOW (ref 3.87–5.11)
RDW: 14.8 % (ref 11.5–15.5)
WBC: 10.4 10*3/uL (ref 4.0–10.5)
nRBC: 0 % (ref 0.0–0.2)

## 2021-06-10 LAB — PHOSPHORUS: Phosphorus: 2.1 mg/dL — ABNORMAL LOW (ref 2.5–4.6)

## 2021-06-10 LAB — MAGNESIUM: Magnesium: 2.1 mg/dL (ref 1.7–2.4)

## 2021-06-10 MED ORDER — SODIUM CHLORIDE 0.9% FLUSH
10.0000 mL | Freq: Two times a day (BID) | INTRAVENOUS | Status: DC
  Administered 2021-06-10 – 2021-06-11 (×3): 10 mL
  Administered 2021-06-11: 40 mL
  Administered 2021-06-12: 10 mL
  Administered 2021-06-12: 40 mL
  Administered 2021-06-13 – 2021-06-19 (×13): 10 mL

## 2021-06-10 MED ORDER — HYDROMORPHONE HCL 1 MG/ML IJ SOLN
0.5000 mg | INTRAMUSCULAR | Status: DC | PRN
  Administered 2021-06-10 – 2021-06-12 (×6): 0.5 mg via INTRAVENOUS
  Filled 2021-06-10 (×6): qty 0.5

## 2021-06-10 MED ORDER — ENOXAPARIN SODIUM 60 MG/0.6ML IJ SOSY
0.5000 mg/kg | PREFILLED_SYRINGE | INTRAMUSCULAR | Status: DC
  Administered 2021-06-10: 57.5 mg via SUBCUTANEOUS
  Filled 2021-06-10 (×2): qty 0.6

## 2021-06-10 MED ORDER — INSULIN ASPART 100 UNIT/ML IJ SOLN
0.0000 [IU] | INTRAMUSCULAR | Status: DC
  Administered 2021-06-10 – 2021-06-11 (×3): 4 [IU] via SUBCUTANEOUS
  Administered 2021-06-11: 3 [IU] via SUBCUTANEOUS
  Administered 2021-06-11 (×3): 7 [IU] via SUBCUTANEOUS
  Administered 2021-06-11: 4 [IU] via SUBCUTANEOUS
  Administered 2021-06-12 (×2): 7 [IU] via SUBCUTANEOUS
  Administered 2021-06-12: 15 [IU] via SUBCUTANEOUS
  Administered 2021-06-12: 7 [IU] via SUBCUTANEOUS
  Administered 2021-06-12 – 2021-06-13 (×3): 11 [IU] via SUBCUTANEOUS
  Administered 2021-06-13 (×2): 7 [IU] via SUBCUTANEOUS
  Administered 2021-06-13: 11 [IU] via SUBCUTANEOUS
  Administered 2021-06-13: 7 [IU] via SUBCUTANEOUS
  Administered 2021-06-14: 15 [IU] via SUBCUTANEOUS
  Administered 2021-06-14: 7 [IU] via SUBCUTANEOUS
  Administered 2021-06-14: 11 [IU] via SUBCUTANEOUS
  Administered 2021-06-14: 15 [IU] via SUBCUTANEOUS
  Administered 2021-06-14 – 2021-06-15 (×5): 11 [IU] via SUBCUTANEOUS
  Administered 2021-06-15: 7 [IU] via SUBCUTANEOUS
  Administered 2021-06-16: 11 [IU] via SUBCUTANEOUS
  Administered 2021-06-16: 7 [IU] via SUBCUTANEOUS
  Administered 2021-06-16 (×2): 15 [IU] via SUBCUTANEOUS
  Administered 2021-06-16 (×2): 11 [IU] via SUBCUTANEOUS
  Administered 2021-06-17 (×2): 15 [IU] via SUBCUTANEOUS
  Administered 2021-06-17: 11 [IU] via SUBCUTANEOUS
  Administered 2021-06-17 (×2): 15 [IU] via SUBCUTANEOUS
  Administered 2021-06-17: 11 [IU] via SUBCUTANEOUS
  Administered 2021-06-17: 15 [IU] via SUBCUTANEOUS
  Administered 2021-06-18: 7 [IU] via SUBCUTANEOUS
  Administered 2021-06-18 (×4): 11 [IU] via SUBCUTANEOUS
  Administered 2021-06-19 (×2): 7 [IU] via SUBCUTANEOUS
  Administered 2021-06-19: 11 [IU] via SUBCUTANEOUS
  Filled 2021-06-10 (×49): qty 1

## 2021-06-10 MED ORDER — SODIUM PHOSPHATES 45 MMOLE/15ML IV SOLN
15.0000 mmol | Freq: Once | INTRAVENOUS | Status: AC
  Administered 2021-06-10: 15 mmol via INTRAVENOUS
  Filled 2021-06-10: qty 5

## 2021-06-10 MED ORDER — SODIUM CHLORIDE 0.9% FLUSH
10.0000 mL | INTRAVENOUS | Status: DC | PRN
  Administered 2021-06-13: 10 mL

## 2021-06-10 MED ORDER — CHLORHEXIDINE GLUCONATE CLOTH 2 % EX PADS
6.0000 | MEDICATED_PAD | Freq: Every day | CUTANEOUS | Status: DC
  Administered 2021-06-10 – 2021-06-19 (×10): 6 via TOPICAL

## 2021-06-10 MED ORDER — TRAVASOL 10 % IV SOLN
INTRAVENOUS | Status: AC
  Filled 2021-06-10: qty 594

## 2021-06-10 NOTE — Assessment & Plan Note (Signed)
Still having some loose bowel movements.  C. difficile and GI pathogen panel negative - Query paradoxical diarrhea - However given recent antibiotic use for UTI, infectious diarrhea cannot be excluded at this time

## 2021-06-10 NOTE — Progress Notes (Signed)
Progress Note   Patient: [name] NHA:[id number] DOB: 29-Mar-1965 DOA: 06/08/2021     1 DOS: the patient was seen and examined on 06/10/2021   Brief hospital course: [name] is a 56 year old female with history of hypertension, hyperlipidemia, multiple hospitalization for small bowel obstructions, history of incisional hernia repair in 2013, anxiety, depression, asthma, history of idiopathic epilepsy/petit mall with last seizure being in 2012, controlled with Topamax, GERD, left eye legally blindness, sleep apnea who does not use CPAP at home, stress incontinence, who presents emergency department for chief concerns of generalized weakness, decreased appetite, nausea, vomiting.  Initial vitals in the emergency department showed temperature of 98.5, respiration rate of 33, heart rate of 121, blood pressure 147/105, SPO2 of 100% on room air.  Serum sodium is 141, potassium 4.4, chloride 100, bicarb 28, BUN of 22, serum creatinine of 1.64, nonfasting blood glucose 117, GFR of 37.  Magnesium is 2.5.  WBC 12.3, hemoglobin 15.1, platelets of 385.  CT abdomen pelvis with contrast: Was read as diffusely dilated and fluid-filled stomach and proximal small bowel with gradual transition to more normal caliber terminal ileum.  Fluid-filled proximal colon.  The findings can be seen in setting of underlying infectious or inflammatory enterocolitis.  Mild mesenteric edema.  No free air.  Incomplete duodenal rotation.  Mild bilateral nonobstructing nephrolithiasis.  ED treatment: Dilaudid 0.5 mg IV x2 doses, ondansetron 4 mg IV x2 doses, sodium chloride 1 L bolus.  5/22: Patient continued to have abdominal pain with nausea and vomiting.  Compazine was also added with Zofran. Patient was recently seen by general surgery and MR with enterography was ordered and scheduled for 5/28. Worsening leukocytosis. NG tube was placed with significant drainage. General surgery was also consulted for  concern of another SBO. She will be most likely going to the OR for exploratory laparotomy as this is her third admission for similar reasons in the past 103-month  5/23: Symptoms are improving with NG tube, significant drainage of above 2 L in the past 24-hour.  Continue to have some diarrhea.  General surgery is planning exploratory laparotomy on 06/12/2021. PICC line to be placed today followed by starting TPN. Renal functions improving, creatinine at 1.05 today. Leukocytosis resolved, all cell lines decreased so there must be some dilutional effect. C. difficile and GI pathogen panel negative.   Assessment and Plan: * Intractable nausea and vomiting Improved with NG tube placement.  Significant NG tube discharge. - Multifactorial including possible small bowel obstruction/ileus in setting of GERD. -General surgery consult as she is a known patient to them, due to recurrent admissions for similar reasons and history of multiple abdominal surgeries patient will be going to the OR on 06/12/2021. PICC line was ordered by general surgery and patient will be started on TPN today. -Continue Compazine with Zofran - Continue Zofran -Continue with pain management -Continue TPN  Hyperkalemia Resolved with IV fluid and discontinuation of spironolactone. -Continue to monitor  Seizure (HCC) - Resumed home Keppra 500 mg daily, 750 mg nightly; gabapentin 600 mg twice daily, 1200 mg nightly - Fall precautions   AK[place]) [place] Continue to improve, creatinine at 1.05 today, which is close to baseline of 1 - Presumed secondary to prerenal in setting of poor p.o. intake in setting of nausea and vomiting and diarrhea - Monitor renal function -Avoid nephrotoxins.  OSA (obstructive sleep apnea) - CPAP nightly ordered  Essential hypertension, benign Blood pressure mildly elevated. -Continue home dose of carvedilol and  hydralazine. -Hold spironolactone due to hyperkalemia -Holding  home Benicar for AKI-can be resumed if needed  GERD without esophagitis - Protonix 40 mg IV twice daily  Type 2 diabetes mellitus with chronic kidney disease, with long-term current use of insulin (HCC) CBG elevated - Patient's insulin dosing is reported as insulin lispro, inject 160 units daily in divided doses as directed, Trulicity weekly injection -  SSI with at bedtime coverage -Add Semglee 5 units twice daily   Obesity, Class III, BMI 40-49.9 (morbid obesity) ([place]) Estimated body mass index is 40.7 kg/m as calculated from the following:   Height as of this encounter: 5' 6.5" (1.689 m).   Weight as of this encounter: 116.1 kg.  This will complicate overall prognosis.  CAD (coronary artery disease) - Rosuvastatin 10 mg nightly, spironolactone 25 mg daily, carvedilol 25 mg p.o. twice daily have been resumed - I have not resumed Plavix 75 mg daily due to ileus versus small bowel obstruction - Per med reconciliation patient last dose of Plavix was 06/08/2021  Major depressive disorder, recurrent episode, moderate (HCC) - Resumed home duloxetine 60 mg daily, amitriptyline 70 to 75 mg nightly  Diarrhea Still having some loose bowel movements.  C. difficile and GI pathogen panel negative - Query paradoxical diarrhea - However given recent antibiotic use for UTI, infectious diarrhea cannot be excluded at this time   Hyperlipemia-resolved as of 06/09/2021 - Ezetimibe 10 mg daily resumed  Dyslipidemia - Ezetimibe 10 mg daily, rosuvastatin 10 mg nightly resumed  Ileus (HCC)-resolved as of 06/09/2021 - Symptomatic support, previously patient symptoms have resolved with conservative management - I have not consulted general surgery at this time - A.m. team to consult if patient's symptoms do not improve     Subjective: Patient was feeling little improved today.  Denies any more nausea, vomiting or pain.  Still having loose bowel movements, had just 1 since this  morning.  Physical Exam: Vitals:   06/09/21 1320 06/09/21 1928 06/10/21 0426 06/10/21 0853  BP: (!) 160/92 (!) 110/93 137/61 (!) 147/72  Pulse: 99 98 86 90  Resp: 18 20 20 18   Temp: 97.9 F (36.6 C) 98.9 F (37.2 C) 98.3 F (36.8 C) 98.4 F (36.9 C)  TempSrc: Oral     SpO2: 98% 95% 94% 96%  Weight:      Height:       General.  Morbidly obese lady, in no acute distress. Pulmonary.  Lungs clear bilaterally, normal respiratory effort. CV.  Regular rate and rhythm, no JVD, rub or murmur. Abdomen.  Soft, nontender, nondistended, BS positive. CNS.  Alert and oriented .  No focal neurologic deficit. Extremities.  No edema, no cyanosis, pulses intact and symmetrical. Psychiatry.  Judgment and insight appears normal.  Data Reviewed: Prior notes, labs and images reviewed.  Family Communication: Discussed with patient  Disposition: Status is: Inpatient Remains inpatient appropriate because: Severity of illness   Planned Discharge Destination: Home  DVT prophylaxis.  Lovenox Time spent: 45 minutes  This record has been created using Systems analyst. Errors have been sought and corrected,but may not always be located. Such creation errors do not reflect on the standard of care.  Author: Lorella Nimrod, MD 06/10/2021 3:05 PM  For on call review [email or web address].

## 2021-06-10 NOTE — Clinical Social Work Note (Signed)
  Transition of Care [facility]) Screening Note   Patient Details  Name: [name] Date of Birth: February 04, 1965   [facility] of Care [facility]) CM/SW Contact:    Eileen Stanford, LCSW Phone [place] 06/10/2021, 2:43 PM    Transition of Care Department [facility]) has reviewed patient and no TOC needs have been identified at this time. We will continue to monitor patient advancement through interdisciplinary progression rounds. If new patient transition needs arise, please place a TOC consult.

## 2021-06-10 NOTE — Progress Notes (Signed)
Mobility Specialist - Progress Note   06/10/21 1500  Mobility  Activity Contraindicated/medical hold     Pt currently in sterile procedure for PICC line placement. Will attempt another date/time.    Kathee Delton Mobility Specialist 06/10/21, 3:02 PM

## 2021-06-10 NOTE — Progress Notes (Signed)
Peripherally Inserted Central Catheter Placement  The IV Nurse has discussed with the patient and/or persons authorized to consent for the patient, the purpose of this procedure and the potential benefits and risks involved with this procedure.  The benefits include less needle sticks, lab draws from the catheter, and the patient may be discharged home with the catheter. Risks include, but not limited to, infection, bleeding, blood clot (thrombus formation), and puncture of an artery; nerve damage and irregular heartbeat and possibility to perform a PICC exchange if needed/ordered by physician.  Alternatives to this procedure were also discussed.  Bard Power PICC patient education guide, fact sheet on infection prevention and patient information card has been provided to patient /or left at bedside.    PICC Placement Documentation  PICC Double Lumen 06/10/21 Right Brachial 46 cm 0 cm (Active)  Indication for Insertion or Continuance of Line Administration of hyperosmolar/irritating solutions (i.e. TPN, Vancomycin, etc.) 06/10/21 1453  Exposed Catheter (cm) 0 cm 06/10/21 1453  Site Assessment Clean, Dry, Intact 06/10/21 1453  Lumen #1 Status Flushed;Saline locked;Blood return noted 06/10/21 1453  Lumen #2 Status Flushed;Saline locked;Blood return noted 06/10/21 1453  Dressing Type Transparent;Securing device 06/10/21 1453  Dressing Status Antimicrobial disc in place 06/10/21 1453  Dressing Intervention New dressing;Other (Comment) 06/10/21 1453  Dressing Change Due 06/17/21 06/10/21 1453       Synthia Innocent 06/10/2021, 2:56 PM

## 2021-06-10 NOTE — Plan of Care (Signed)
  Problem: Clinical Measurements: Goal: Ability to maintain clinical measurements within normal limits will improve Outcome: Progressing Goal: Will remain free from infection Outcome: Progressing Goal: Diagnostic test results will improve Outcome: Progressing Goal: Respiratory complications will improve Outcome: Progressing Goal: Cardiovascular complication will be avoided Outcome: Progressing   Problem: Pain Managment: Goal: General experience of comfort will improve Outcome: Progressing   Pt is involved in and agrees with the plan of care. V/S stable. Reports abdominal pain; Dilaudid IV given 1x. NGT in place to low intermittent suction with greenish output.

## 2021-06-10 NOTE — Plan of Care (Signed)

## 2021-06-10 NOTE — Progress Notes (Signed)
Initial Nutrition Assessment  DOCUMENTATION CODES:   Morbid obesity  INTERVENTION:   -TPN management per pharmacy  NUTRITION DIAGNOSIS:   Inadequate oral intake related to altered GI function as evidenced by NPO status.  GOAL:   Patient will meet greater than or equal to 90% of their needs  MONITOR:   Diet advancement  REASON FOR ASSESSMENT:   Consult New TPN/TNA  ASSESSMENT:   PTwith history of hypertension, hyperlipidemia, multiple hospitalization for small bowel obstructions, history of incisional hernia repair in 2013, anxiety, depression, asthma, history of idiopathic epilepsy/petit mall with last seizure being in 2012, controlled with Topamax, GERD, left eye legally blindness, sleep apnea who does not use CPAP at home, stress incontinence, who presents for chief concerns of generalized weakness, decreased appetite, nausea, vomiting.  Pt admitted with intractable nausea ad vomiting secondary to SBO.   5/22- NGT placed for decompression  Reviewed I/O's: -820 ml x 24 hours and +180 ml since admission  UOP: 300 ml x 24 hours  NGT output: 2000 ml x 24 hours  Per general surgery notes, plan to place PICC and start TPN today. Plan for surgical intervention on Thursday (06/12/21).   Spoke with pt at bedside, who reports ongoing difficulty with SBOs since November 2022. She shares that she has been hospitalized for SBO 3 times since January 2023. Pt reports that her oral intake is very inconsistent and often feels bloated, which causes her early satiety. Pt shares that she is often most comfortable drinking liquids. Her last meal was on Sunday afternoon (06/08/21) PTA (PB&J sandwich and orange); she shares this initially stayed down, but she vomited while in the ED and was concerned about the red color of vomit (suspects it may have been from red kool aid she drank earlier). Pt also endorses progressive weakness; due to history of multiple strokes, pt often needs help with ADLs,  but become concerned when she felt too weak to walk.   Noted pt with leukonychia on nails, which she attributes to recent removal of gel nail polish. Pt also with vertical ridges on nails.    Pt denies any wt loss. She reports her UBW is around 250#. Reviewed wt hx; pt has experienced a 3.5% wt loss over the past 3 months, which is not significant for time frame.   Medications reviewed and include vitamin D3.  Pt understanding of NPO status. She was able to verbalize plan for PICC and TPN. Discussed how pt would receive nutrition as well as possibility for diet advancement.   Lab Results  Component Value Date   HGBA1C 7.5 (H) 04/26/2021   PTA DM medications are 4.5 mg trulicity weekly and insulin lispro via insulin pump.   Labs reviewed:CBGS: 130-183 (inpatient orders for glycemic control are 0-20 units insulin aspart every 4 hours, 0-5 units insulin aspart daily at bedtime, and 5 units insulin glargine-yfgn BID).    NUTRITION - FOCUSED PHYSICAL EXAM:  Flowsheet Row Most Recent Value  Orbital Region No depletion  Upper Arm Region No depletion  Thoracic and Lumbar Region No depletion  Buccal Region No depletion  Temple Region No depletion  Clavicle Bone Region No depletion  Clavicle and Acromion Bone Region No depletion  Scapular Bone Region No depletion  Dorsal Hand No depletion  Patellar Region No depletion  Anterior Thigh Region No depletion  Posterior Calf Region No depletion  Edema (RD Assessment) Mild  Hair Reviewed  Eyes Reviewed  Mouth Reviewed  Skin Reviewed  Nails Reviewed  Diet Order:   Diet Order             Diet NPO time specified Except for: Sips with Meds, Ice Chips  Diet effective midnight                   EDUCATION NEEDS:   Education needs have been addressed  Skin:  Skin Assessment: Reviewed RN Assessment  Last BM:  Unknown  Height:   Ht Readings from Last 1 Encounters:  06/08/21 5' 6.5" (1.689 m)    Weight:   Wt Readings  from Last 1 Encounters:  06/08/21 116.1 kg    Ideal Body Weight:  62.5 kg  BMI:  Body mass index is 40.7 kg/m.  Estimated Nutritional Needs:   Kcal:  2150-2350  Protein:  110-125 grams  Fluid:  > 2 L    Loistine Chance, RD, LDN, [place] Registered Dietitian II Certified Diabetes Care and Education Specialist Please refer to [facility] for RD and/or RD on-call/weekend/after hours pager

## 2021-06-10 NOTE — Progress Notes (Addendum)
[facility] SURGICAL PROGRESS NOTE (cpt [phone])  Hospital Day(s): 1.   Interval History: Patient seen and examined, no acute events or new complaints overnight. Patient reports she still has some abdominal pain and distension. No fever, chills, nausea, emesis. Leukocytosis now resolved; 10.4K. Renal function improving; sCr - 1.05; UO - 300 ccs + unmeasured. No significant electrolyte derangements this morning. NGT with 2000 ccs out in last 24 hours. She continues to have diarrhea.  Review of Systems:  Constitutional: denies fever, chills  HEENT: denies cough or congestion  Respiratory: denies any shortness of breath  Cardiovascular: denies chest pain or palpitations  Gastrointestinal: + Abdominal pain, + distension, denied N/V Genitourinary: denies burning with urination or urinary frequency Musculoskeletal: denies pain, decreased motor or sensation  Vital signs in last 24 hours: [min-max] current  Temp:  [97.9 F (36.6 C)-98.9 F (37.2 C)] 98.3 F (36.8 C) (05/23 0426) Pulse Rate:  [86-103] 86 (05/23 0426) Resp:  [16-26] 20 (05/23 0426) BP: (110-185)/(61-108) 137/61 (05/23 0426) SpO2:  [92 %-100 %] 94 % (05/23 0426)     Height: 5' 6.5" (168.9 cm) Weight: 116.1 kg BMI (Calculated): 40.71   Intake/Output last 2 shifts:  05/22 0701 - 05/23 0700 In: 1480.1 [I.V.:920.1; NG/GT:60; IV Piggyback:500] Out: 2300 [Urine:300; Emesis/NG output:2000]   Physical Exam:  Constitutional: alert, cooperative and no distress  HENT: normocephalic without obvious abnormality; NGT in place Eyes: PERRL, EOM's grossly intact and symmetric  Respiratory: breathing non-labored at rest  Cardiovascular: regular rate and sinus rhythm  Gastrointestinal: Soft, still distended and tympanic, no significant localized tenderness, no rebound/guarding  Musculoskeletal: no edema or wounds, motor and sensation grossly intact, NT    Labs:     Latest Ref Rng & Units 06/10/2021    4:13 AM 06/09/2021     4:45 AM 06/08/2021    8:13 PM  CBC  WBC 4.0 - 10.5 K/uL 10.4   16.5   12.3    Hemoglobin 12.0 - 15.0 g/dL 10.1   13.1   15.1    Hematocrit 36.0 - 46.0 % 32.7   42.9   48.9    Platelets 150 - 400 K/uL 268   357   385        Latest Ref Rng & Units 06/10/2021    4:13 AM 06/09/2021    9:26 AM 06/09/2021    5:25 AM  CMP  Glucose 70 - 99 mg/dL 189      BUN 6 - 20 mg/dL 25      Creatinine 0.44 - 1.00 mg/dL 1.05      Sodium 135 - 145 mmol/L 142      Potassium 3.5 - 5.1 mmol/L 4.2   5.0   5.6    Chloride 98 - 111 mmol/L 108      CO2 22 - 32 mmol/L 26      Calcium 8.9 - 10.3 mg/dL 8.0        Imaging studies: No new pertinent imaging studies   Assessment/Plan: (ICD-10's: K69.609) 56 y.o. female with now her third episode of abdominal distension, nausea, emesis, and diarrhea in the last 2 months concerning for possible recurrent SBO vs Ileus.              - Plan or surgical intervention on Thursday (05/25) with Dr Hampton Abbot.   - Plan for PICC and TPN today             - Continue NGT decompression; monitor and record output             -  NPO; Will initiate TPN given her likely poor nutritional status in the last month and anticipated prolonged PO period            - Monitor abdominal examination; on-going bowel function            - Serial KUBs as needed            - Pain control prn; antiemetics prn             - Mobilization as tolerated            - Further management per primary service; we will follow    All of the above findings and recommendations were discussed with the patient, and all of patient's questions were answered to her expressed satisfaction.  -- Edison Simon, [name] [facility] 06/10/2021, 7:14 AM M-F: 7am - 4pm

## 2021-06-10 NOTE — Progress Notes (Signed)
PHARMACY - TOTAL PARENTERAL NUTRITION CONSULT NOTE   Indication: Small bowel obstruction  Patient Measurements: Height: 5' 6.5" (168.9 cm) Weight: 116.1 kg (256 lb) IBW/kg (Calculated) : 60.45 TPN AdjBW (KG): 74.4 Body mass index is 40.7 kg/m. Usual Weight:    Assessment: Pt admitted with intractable nausea ad vomiting secondary to SBO,  hx multiple hospitalization for small bowel obstructions  Glucose / Insulin: BG 139-190   SSI resistant q4h,  Glargine 5 units BID (Prior to start of TPN per last 24 hrs: 30 units SSI, 10 units glargine) -on insulin pump PTA  Electrolytes: Phos 2.1   K 4.2  Na 142 Will replete with Sodium Phosphate 15 mmol IV x1 Renal: Scr 1.05 Hepatic: AST 24/ALT 25 Intake / Output; -820 ml x 24 hours and +180 ml since admission MIVF: none GI Imaging: GI Surgeries / Procedures: place PICC and start TPN today. Plan for surgical intervention on Thursday (06/12/21).   Central access: PICC to be placed 5/23 TPN start date: 5/23  Nutritional Goals: Goal TPN rate is 90 mL/hr (provides 118.8 g of protein and 2224 kcals per day),  fluids 2160 ml/24 hrs  RD Assessment: Estimated Needs Total Energy Estimated Needs: 2150-2350 Total Protein Estimated Needs: 110-125 grams Total Fluid Estimated Needs: > 2 L  Current Nutrition:  NPO  Plan:  Start TPN at half-rate of 45m/hr at 1800 -Amino Acids 10%- 55 g/L -Dextrose 15% -SMOFlipid 20%- 30 g/L  -Electrolytes in TPN: Na 549m/L, K 5075mL, Ca 5mE5m, Mg 5mEq92m and Phos 15mmo72m Cl:Ac 1:1 -Phos 2.1  Will replete with Sodium Phosphate 15 mmol IV x1 -Add standard MVI and trace elements to TPN -continue Resistant q4h SSI and adjust as needed, also currently on insulin glargine 5 units BID  -Monitor TPN labs for 1st 3 days after initiation then on Mon/Thurs  Minas Bonser A 06/10/2021,1:00 PM

## 2021-06-10 NOTE — Assessment & Plan Note (Signed)
Improved with NG tube placement.  Significant NG tube discharge. - Multifactorial including possible small bowel obstruction/ileus in setting of GERD. -General surgery consult as she is a known patient to them, due to recurrent admissions for similar reasons and history of multiple abdominal surgeries patient will be going to the OR on 06/12/2021. PICC line was ordered by general surgery and patient will be started on TPN today. -Continue Compazine with Zofran - Continue Zofran -Continue with pain management -Continue TPN

## 2021-06-10 NOTE — Assessment & Plan Note (Signed)
Blood pressure mildly elevated. -Continue home dose of carvedilol and hydralazine. -Hold spironolactone due to hyperkalemia -Holding home Benicar for AKI-can be resumed if needed

## 2021-06-10 NOTE — Assessment & Plan Note (Signed)
Continue to improve, creatinine at 1.05 today, which is close to baseline of 1 - Presumed secondary to prerenal in setting of poor p.o. intake in setting of nausea and vomiting and diarrhea - Monitor renal function -Avoid nephrotoxins.

## 2021-06-10 NOTE — Assessment & Plan Note (Signed)
Resolved with IV fluid and discontinuation of spironolactone. -Continue to monitor

## 2021-06-11 DIAGNOSIS — R112 Nausea with vomiting, unspecified: Secondary | ICD-10-CM | POA: Diagnosis not present

## 2021-06-11 DIAGNOSIS — K56609 Unspecified intestinal obstruction, unspecified as to partial versus complete obstruction: Secondary | ICD-10-CM | POA: Diagnosis not present

## 2021-06-11 LAB — COMPREHENSIVE METABOLIC PANEL
ALT: 14 U/L (ref 0–44)
AST: 12 U/L — ABNORMAL LOW (ref 15–41)
Albumin: 3 g/dL — ABNORMAL LOW (ref 3.5–5.0)
Alkaline Phosphatase: 53 U/L (ref 38–126)
Anion gap: 5 (ref 5–15)
BUN: 18 mg/dL (ref 6–20)
CO2: 28 mmol/L (ref 22–32)
Calcium: 8.3 mg/dL — ABNORMAL LOW (ref 8.9–10.3)
Chloride: 109 mmol/L (ref 98–111)
Creatinine, Ser: 0.93 mg/dL (ref 0.44–1.00)
GFR, Estimated: >60 mL/min (ref >60)
Glucose, Bld: 215 mg/dL — ABNORMAL HIGH (ref 70–99)
Potassium: 4 mmol/L (ref 3.5–5.1)
Sodium: 142 mmol/L (ref 135–145)
Total Bilirubin: 0.5 mg/dL (ref 0.3–1.2)
Total Protein: 6.5 g/dL (ref 6.5–8.1)

## 2021-06-11 LAB — PHOSPHORUS: Phosphorus: 2.8 mg/dL (ref 2.5–4.6)

## 2021-06-11 LAB — GLUCOSE, CAPILLARY
Glucose-Capillary: 144 mg/dL — ABNORMAL HIGH (ref 70–99)
Glucose-Capillary: 157 mg/dL — ABNORMAL HIGH (ref 70–99)
Glucose-Capillary: 174 mg/dL — ABNORMAL HIGH (ref 70–99)
Glucose-Capillary: 196 mg/dL — ABNORMAL HIGH (ref 70–99)
Glucose-Capillary: 205 mg/dL — ABNORMAL HIGH (ref 70–99)
Glucose-Capillary: 210 mg/dL — ABNORMAL HIGH (ref 70–99)

## 2021-06-11 LAB — TRIGLYCERIDES: Triglycerides: 132 mg/dL (ref <150)

## 2021-06-11 LAB — MAGNESIUM: Magnesium: 2.3 mg/dL (ref 1.7–2.4)

## 2021-06-11 MED ORDER — CEFOTETAN DISODIUM 2 G IJ SOLR
2.0000 g | INTRAMUSCULAR | Status: AC
  Administered 2021-06-12: 2 g via INTRAVENOUS

## 2021-06-11 MED ORDER — TRAVASOL 10 % IV SOLN
INTRAVENOUS | Status: AC
  Filled 2021-06-11: qty 1188

## 2021-06-11 NOTE — Plan of Care (Signed)

## 2021-06-11 NOTE — Progress Notes (Signed)
Progress Note   Patient: [name] NTI:[id number] DOB: [birth year] DOA: 06/08/2021     2 DOS: the patient was seen and examined on 06/11/2021   Brief hospital course: [name] is a 56 year old female with history of hypertension, hyperlipidemia, multiple hospitalization for small bowel obstructions, history of incisional hernia repair in 2013, anxiety, depression, asthma, history of idiopathic epilepsy/petit mall with last seizure being in 2012, controlled with Topamax, GERD, left eye legally blindness, sleep apnea who does not use CPAP at home, stress incontinence, who presents emergency department for chief concerns of generalized weakness, decreased appetite, nausea, vomiting.   Initial vitals in the emergency department showed temperature of 98.5, respiration rate of 33, heart rate of 121, blood pressure 147/105, SPO2 of 100% on room air.  Serum sodium is 141, potassium 4.4, chloride 100, bicarb 28, BUN of 22, serum creatinine of 1.64, nonfasting blood glucose 117, GFR of 37.  Magnesium is 2.5.  WBC 12.3, hemoglobin 15.1, platelets of 385.   CT abdomen pelvis with contrast: Was read as diffusely dilated and fluid-filled stomach and proximal small bowel with gradual transition to more normal caliber terminal ileum.  Fluid-filled proximal colon.  The findings can be seen in setting of underlying infectious or inflammatory enterocolitis.  Mild mesenteric edema.  No free air.  Incomplete duodenal rotation.  Mild bilateral nonobstructing nephrolithiasis.  ED treatment: Dilaudid 0.5 mg IV x2 doses, ondansetron 4 mg IV x2 doses, sodium chloride 1 L bolus.   5/22: Patient continued to have abdominal pain with nausea and vomiting.  Compazine was also added with Zofran. Patient was recently seen by general surgery and MR with enterography was ordered and scheduled for 5/28. Worsening leukocytosis. NG tube was placed with significant drainage. General surgery was also consulted  for concern of another SBO. She will be most likely going to the OR for exploratory laparotomy as this is her third admission for similar reasons in the past 36-month   5/23: Symptoms are improving with NG tube, significant drainage of above 2 L in the past 24-hour.  Continue to have some diarrhea.  General surgery is planning exploratory laparotomy on 06/12/2021. PICC line to be placed today followed by starting TPN. Renal functions improving, creatinine at 1.05 today. Leukocytosis resolved, all cell lines decreased so there must be some dilutional effect. C. difficile and GI pathogen panel negative.   Assessment and Plan: * Intractable nausea and vomiting SBO Improved with NG tube placement.  Significant NG tube discharge. - Multifactorial including possible small bowel obstruction/ileus in setting of GERD. -General surgery consult as she is a known patient to them, due to recurrent admissions for similar reasons and history of multiple abdominal surgeries patient will be going to the OR on 06/12/2021. PICC line was ordered by general surgery and patient will be started on TPN 5/24. -Continue Compazine with Zofran - Continue Zofran -Continue with pain management -Continue TPN  Hyperkalemia Resolved with IV fluid and discontinuation of spironolactone. -Continue to monitor  Seizure (HCC) - Resumed home Keppra 500 mg daily, 750 mg nightly; gabapentin 600 mg twice daily, 1200 mg nightly - Fall precautions  AKI (acute kidney injury) ([place] Resolved. Presumed secondary to prerenal in setting of poor p.o. intake in setting of nausea and vomiting and diarrhea - Monitor renal function -Avoid nephrotoxins.  OSA (obstructive sleep apnea) - CPAP nightly ordered  Essential hypertension, benign Blood pressure wnl -Continue home dose of carvedilol and hydralazine. -Hold spironolactone due to hyperkalemia -Holding home Benicar for AKI-can  be resumed if needed  GERD without esophagitis -  Protonix 40 mg IV twice daily  Type 2 diabetes mellitus with chronic kidney disease, with long-term current use of insulin (HCC) CBG elevated - Patient's insulin dosing is reported as insulin lispro, inject 160 units daily in divided doses as directed, Trulicity weekly injection -  SSI with at bedtime coverage -Added Semglee 5 units twice daily  Obesity, Class III, BMI 40-49.9 (morbid obesity) ([place]) Estimated body mass index is 40.7 kg/m as calculated from the following:   Height as of this encounter: 5' 6.5" (1.689 m).   Weight as of this encounter: 116.1 kg. This will complicate overall prognosis.  CAD (coronary artery disease) - Rosuvastatin 10 mg nightly, spironolactone 25 mg daily, carvedilol 25 mg p.o. twice daily have been resumed - I have not resumed Plavix 75 mg daily due to ileus versus small bowel obstruction - Per med reconciliation patient last dose of Plavix was 06/08/2021  Major depressive disorder, recurrent episode, moderate (HCC) - Resumed home duloxetine 60 mg daily, amitriptyline 70 to 75 mg nightly  Diarrhea Still having some loose bowel movements.  C. difficile and GI pathogen panel negative - Query paradoxical diarrhea - However given recent antibiotic use for UTI, infectious diarrhea cannot be excluded at this time  Dyslipidemia - Ezetimibe 10 mg daily, rosuvastatin 10 mg nightly resumed     Subjective: Patient was feeling little improved today.  Denies any more nausea, vomiting or pain.  Still having loose bowel movements. Mild abd discomfort  Physical Exam: Vitals:   06/10/21 2005 06/11/21 0331 06/11/21 0726 06/11/21 0900  BP: 128/81 115/65  130/79  Pulse: 85 88  79  Resp: 20 20  18   Temp: 98.1 F (36.7 C) 97.8 F (36.6 C)  98.6 F (37 C)  TempSrc: Oral Oral  Oral  SpO2: 98% 99% 96% 97%  Weight:      Height:       General.  Morbidly obese lady, in no acute distress. Pulmonary.  Lungs clear bilaterally, normal respiratory effort. CV.   Regular rate and rhythm, no JVD, rub or murmur. Abdomen.  Soft, nontender, mild distention, BS positive. CNS.  Alert and oriented .  No focal neurologic deficit. Extremities.  No edema, no cyanosis, pulses intact and symmetrical. Psychiatry.  Judgment and insight appears normal.  Data Reviewed: Prior notes, labs and images reviewed.  Family Communication: Discussed with patient  Disposition: Status is: Inpatient Remains inpatient appropriate because: Severity of illness   Planned Discharge Destination: Home  DVT prophylaxis.  Lovenox Time spent: 45 minutes   Author: Desma Maxim, MD 06/11/2021 2:28 PM  For on call review [email or web address].

## 2021-06-11 NOTE — Progress Notes (Signed)
Mobility Specialist - Progress Note     06/11/21 1500  Mobility  Activity Ambulated with assistance in hallway;Stood at bedside;Dangled on edge of bed  Level of Assistance Standby assist, set-up cues, supervision of patient - no hands on  Assistive Device Front wheel walker  Distance Ambulated (ft) 160 ft  Activity Response Tolerated well  $Mobility charge 1 Mobility    Pre-mobility: HR, BP, SpO2 During mobility: HR, BP, SpO2 Post-mobility: HR, BP, SPO2  Pt long sitting upon arrival using RA. Completes bed mobility ModI and STS with supervision. Ambulates 1 lap Supervision voicing no complaints and returns to bed with needs in reach and bed alarm set.  Merrily Brittle Mobility Specialist 06/11/21, 3:24 PM

## 2021-06-11 NOTE — Progress Notes (Signed)
[facility] SURGICAL PROGRESS NOTE (cpt [phone])  Hospital Day(s): 2.   Interval History: Patient seen and examined, no acute events or new complaints overnight. Patient reports she continues to feel distended but some subjective improvement. No localized tenderness. No fever, chills, nausea, emesis. Renal function improving; sCr - 0.93; UO - unmeasured. No significant electrolyte derangements this morning. NGT with 1620 ccs out in last 24 hours. She continues to have diarrhea. TPN initiated yesterday (05/23).   Review of Systems:  Constitutional: denies fever, chills  HEENT: denies cough or congestion  Respiratory: denies any shortness of breath  Cardiovascular: denies chest pain or palpitations  Gastrointestinal: + distension, denied N/V Genitourinary: denies burning with urination or urinary frequency Musculoskeletal: denies pain, decreased motor or sensation  Vital signs in last 24 hours: [min-max] current  Temp:  [97.8 F (36.6 C)-98.4 F (36.9 C)] 97.8 F (36.6 C) (05/24 0331) Pulse Rate:  [85-90] 88 (05/24 0331) Resp:  [18-20] 20 (05/24 0331) BP: (115-147)/(65-81) 115/65 (05/24 0331) SpO2:  [96 %-99 %] 99 % (05/24 0331)     Height: 5' 6.5" (168.9 cm) Weight: 116.1 kg BMI (Calculated): 40.71   Intake/Output last 2 shifts:  05/23 0701 - 05/24 0700 In: 0  Out: 1620 [Emesis/NG output:1620]   Physical Exam:  Constitutional: alert, cooperative and no distress  HENT: normocephalic without obvious abnormality; NGT in place (output very clear) Eyes: PERRL, EOM's grossly intact and symmetric  Respiratory: breathing non-labored at rest  Cardiovascular: regular rate and sinus rhythm  Gastrointestinal: Soft, still distended and tympanic, no significant localized tenderness, no rebound/guarding  Musculoskeletal: no edema or wounds, motor and sensation grossly intact, NT    Labs:     Latest Ref Rng & Units 06/10/2021    4:13 AM 06/09/2021    4:45 AM 06/08/2021     8:13 PM  CBC  WBC 4.0 - 10.5 K/uL 10.4   16.5   12.3    Hemoglobin 12.0 - 15.0 g/dL 10.1   13.1   15.1    Hematocrit 36.0 - 46.0 % 32.7   42.9   48.9    Platelets 150 - 400 K/uL 268   357   385        Latest Ref Rng & Units 06/11/2021    3:55 AM 06/10/2021    4:13 AM 06/09/2021    9:26 AM  CMP  Glucose 70 - 99 mg/dL 215   189     BUN 6 - 20 mg/dL 18   25     Creatinine 0.44 - 1.00 mg/dL 0.93   1.05     Sodium 135 - 145 mmol/L 142   142     Potassium 3.5 - 5.1 mmol/L 4.0   4.2   5.0    Chloride 98 - 111 mmol/L 109   108     CO2 22 - 32 mmol/L 28   26     Calcium 8.9 - 10.3 mg/dL 8.3   8.0     Total Protein 6.5 - 8.1 g/dL 6.5      Total Bilirubin 0.3 - 1.2 mg/dL 0.5      Alkaline Phos 38 - 126 U/L 53      AST 15 - 41 U/L 12      ALT 0 - 44 U/L 14        Imaging studies: No new pertinent imaging studies   Assessment/Plan: (ICD-10's: K56.609) 56 y.o. female with now her third episode of abdominal distension, nausea, emesis,  and diarrhea in the last 2 months concerning for possible recurrent SBO vs Ileus.              - Plan for surgical intervention on Thursday (05/25) with Dr Hampton Abbot.   - All risks, benefits, and alternatives to above procedure(s) were discussed with the patient, all of her questions were answered to her expressed satisfaction, patient expresses she wishes to proceed, and informed consent was obtained.             - Continue NGT decompression; monitor and record output             - NPO + TPN; advance to goal; electrolytes normal this morning             - Monitor abdominal examination; on-going bowel function            - Serial KUBs as needed            - Pain control prn; antiemetics prn             - Mobilization as tolerated            - Further management per primary service; we will follow    All of the above findings and recommendations were discussed with the patient, and all of patient's questions were answered to her expressed  satisfaction.  -- Edison Simon, [name] [facility] 06/11/2021, 7:22 AM M-F: 7am - 4pm

## 2021-06-11 NOTE — Progress Notes (Signed)
PHARMACY - TOTAL PARENTERAL NUTRITION CONSULT NOTE   Indication: Small bowel obstruction  Patient Measurements: Height: 5' 6.5" (168.9 cm) Weight: 116.1 kg (256 lb) IBW/kg (Calculated) : 60.45 TPN AdjBW (KG): 74.4 Body mass index is 40.7 kg/m. Usual Weight:    Assessment: Pt admitted with intractable nausea ad vomiting secondary to SBO,  hx multiple hospitalization for small bowel obstructions  Glucose / Insulin: BG 112-224 SSI resistant q4h,  Glargine 5 units BID (Insulin per last 24 hrs: 18 units SSI, 10 units glargine) -was on insulin pump PTA  Electrolytes: WNL Will replete with Sodium Phosphate 15 mmol IV x1 Renal: Scr 1.05 Hepatic: AST 12/ALT 14 Intake / Output; TG: 132 MIVF: none GI Imaging: GI Surgeries / Procedures: place PICC and start TPN today 5/23. Plan for surgical intervention on Thursday (06/12/21).   Central access: PICC to be placed 5/23 TPN start date: 5/23  Nutritional Goals: Goal TPN rate is 90 mL/hr (provides 118.8 g of protein and 2224 kcals per day),  fluids 2160 ml/24 hrs  RD Assessment: Estimated Needs Total Energy Estimated Needs: 2150-2350 Total Protein Estimated Needs: 110-125 grams Total Fluid Estimated Needs: > 2 L  Current Nutrition:  NPO  Plan:  Increase TPN to full rate of 90 mL/hr at 1800 -Amino Acids 10%- 55 g/L -Dextrose 15% -SMOFlipid 20%- 30 g/L  -Electrolytes in TPN: Na 56mq/L, K 586m/L, Ca 9m31mL, Mg 9mE83m, and Phos 19mm8m. Cl:Ac 1:1 -Add standard MVI and trace elements to TPN  -continue Resistant q4h SSI and adjust as needed, also currently on insulin glargine 5 units BID  -Monitor TPN labs for 1st 3 days after initiation then on Mon/Thurs  [name] A 06/11/2021,3:11 PM

## 2021-06-11 NOTE — TOC Initial Note (Signed)
Transition of Care [facility]) - Initial/Assessment Note    Patient Details  Name: [name] MRN: [id number] Date of Birth: 14-May-1965  Transition of Care [facility]) CM/SW Contact:    Candie Chroman, LCSW Phone Number: 06/11/2021, 11:45 AM  Clinical Narrative:   This CSW completed readmission prevention screen on 5/1:  "Readmission prevention screen complete. CSW called patient in the room, introduced role, and explained that discharge planning would be discussed. PCP is Delsa Grana, [name] at [facility]. She uses Medical Transportation or her daughter drives her to appointments. [facility] [address] No issues obtaining medications. She is active with [facility] for PT and OT. Patient has a quad cane, SPC, rollator, BSC, and shower chair at home. No further concerns. CSW encouraged patient to contact CSW as needed. CSW will continue to follow patient for support and facilitate return home when stable. Her daughter will transport her home when discharged."  She is currently only getting PT through [facility].                Expected Discharge Plan: [place] Barriers to Discharge: Continued Medical Work up   Patient Goals and CMS Choice     Choice offered to / list presented to : NA  Expected Discharge Plan and Services Expected Discharge Plan: [place] Acute Care Choice: Resumption of Svcs/PTA Provider Living arrangements for the past 2 months: [place]: PT [place]: [place] ([place]) Date [place]: 06/11/21   Representative spoke with at [place]: Floydene Flock  Prior Living Arrangements/Services Living arrangements for the past 2 months: [place]   Patient language and need for interpreter reviewed:: Yes Do you feel safe going back to the place where you live?: Yes      Need for Family  Participation in Patient Care: Yes (Comment)   Current home services: DME, Home PT Criminal Activity/Legal Involvement Pertinent to Current Situation/Hospitalization: No - Comment as needed  Activities of Daily Living Home Assistive Devices/Equipment: Cane (specify quad or straight) ADL Screening (condition at time of admission) Patient's cognitive ability adequate to safely complete daily activities?: Yes Is the patient deaf or have difficulty hearing?: No Does the patient have difficulty seeing, even when wearing glasses/contacts?: Yes Does the patient have difficulty concentrating, remembering, or making decisions?: No Patient able to express need for assistance with ADLs?: Yes Does the patient have difficulty dressing or bathing?: Yes Independently performs ADLs?: Yes (appropriate for developmental age) Does the patient have difficulty walking or climbing stairs?: Yes Weakness of Legs: Both Weakness of Arms/Hands: None  Permission Sought/Granted Permission sought to share information with : Facility Sport and exercise psychologist                Emotional Assessment Appearance:: Appears stated age     Orientation: : Oriented to Self, Oriented to Place, Oriented to  Time, Oriented to Situation Alcohol / Substance Use: Not Applicable Psych Involvement: No (comment)  Admission diagnosis:  Colitis [K52.9] AKI (acute kidney injury) ([place]) [N17.9] Intractable nausea and vomiting [R11.2] Patient Active Problem List   Diagnosis Date Noted   Diarrhea 06/09/2021   Hyperkalemia 06/09/2021   Intractable nausea and vomiting 06/08/2021   Small bowel obstruction ([place]) 05/18/2021  Orthostatic hypotension 04/29/2021   Constipation 04/28/2021   SBO (small bowel obstruction) ([place]) 04/23/2021   Hypertensive urgency 04/23/2021   GERD without esophagitis 04/23/2021   AKI (acute kidney injury) ([place]) 04/23/2021   Dyslipidemia 04/23/2021   Chronic diastolic CHF (congestive heart failure) ([place])  04/23/2021   Cervical spondylosis 02/12/2021   Lumbar facet arthropathy 02/12/2021   Major depressive disorder, recurrent episode, moderate ([place]) 11/13/2020   Adrenal adenoma, right    PAD (peripheral artery disease) ([place]) 01/24/2020   Abnormal ankle brachial index (ABI) 12/18/2019   CAD (coronary artery disease) 04/27/2019   Diabetic polyneuropathy associated with type 2 diabetes mellitus ([place]) 03/02/2018   Vitamin D deficiency 01/24/2018   Overactive bladder 05/18/2017   Dilated aortic root ([place])    Sacroiliac dysfunction 10/09/2014   Chronic low back pain 09/03/2014   CVA, old, hemiparesis ([place]) 09/03/2014   OSA (obstructive sleep apnea) 05/15/2014   Type 2 diabetes mellitus with chronic kidney disease, with long-term current use of insulin ([place]) 01/21/2014   Essential hypertension, benign 01/21/2014   Chronic pain syndrome 01/21/2014   Headache disorder 09/07/2013   Mixed urge and stress incontinence 08/25/2011   Ureteral stricture, right 08/25/2011   Nephrolithiasis 08/22/2011   Incisional hernia 05/10/2011   Obesity, Class III, BMI 40-49.9 (morbid obesity) ([place]) 05/10/2011   BLINDNESS, LEGAL, [place] DEFINITION 09/14/2006   Seizure ([place]) 09/13/2006   PCP:  Delsa Grana, [name] [facility]   [id number] [address] [place]Phone: [phone]: [phone]  [facility] [place]Phone: [phone]: [phone]    Social Determinants of Health (SDOH) Interventions    Readmission Risk Interventions    06/11/2021   11:43 AM 05/19/2021   12:02 PM 04/24/2021    3:02 PM  Readmission Risk Prevention Plan  Transportation Screening Complete Complete Complete  PCP or Specialist Appt within 5-7 Days  Complete   Home Care Screening  Complete   Medication Review (RN CM)  Complete   HRI or HDawes  Complete  Palliative Care Screening   Not  Applicable  Medication Review (RN Care Manager) Complete  Complete  PCP or Specialist appointment within 3-5 days of discharge Complete    HRI or [place] Terre HauteComplete    SW Recovery Care/Counseling Consult Complete    [place] Applicable

## 2021-06-12 ENCOUNTER — Other Ambulatory Visit: Payer: Self-pay | Admitting: Surgery

## 2021-06-12 ENCOUNTER — Other Ambulatory Visit: Payer: Self-pay

## 2021-06-12 ENCOUNTER — Encounter: Payer: Self-pay | Admitting: Internal Medicine

## 2021-06-12 ENCOUNTER — Encounter
Admission: EM | Disposition: A | Discharge status: Home Health | Payer: Self-pay | Source: Home / Self Care | Attending: Obstetrics and Gynecology

## 2021-06-12 ENCOUNTER — Inpatient Hospital Stay: Payer: Medicare Other | Admitting: Anesthesiology

## 2021-06-12 DIAGNOSIS — R140 Abdominal distension (gaseous): Secondary | ICD-10-CM | POA: Diagnosis not present

## 2021-06-12 DIAGNOSIS — R112 Nausea with vomiting, unspecified: Secondary | ICD-10-CM | POA: Diagnosis not present

## 2021-06-12 HISTORY — PX: LAPAROTOMY: SHX154

## 2021-06-12 LAB — GLUCOSE, CAPILLARY
Glucose-Capillary: 232 mg/dL — ABNORMAL HIGH (ref 70–99)
Glucose-Capillary: 234 mg/dL — ABNORMAL HIGH (ref 70–99)
Glucose-Capillary: 235 mg/dL — ABNORMAL HIGH (ref 70–99)
Glucose-Capillary: 235 mg/dL — ABNORMAL HIGH (ref 70–99)
Glucose-Capillary: 257 mg/dL — ABNORMAL HIGH (ref 70–99)
Glucose-Capillary: 264 mg/dL — ABNORMAL HIGH (ref 70–99)
Glucose-Capillary: 315 mg/dL — ABNORMAL HIGH (ref 70–99)

## 2021-06-12 LAB — COMPREHENSIVE METABOLIC PANEL
ALT: 13 U/L (ref 0–44)
AST: 12 U/L — ABNORMAL LOW (ref 15–41)
Albumin: 3 g/dL — ABNORMAL LOW (ref 3.5–5.0)
Alkaline Phosphatase: 50 U/L (ref 38–126)
Anion gap: 7 (ref 5–15)
BUN: 15 mg/dL (ref 6–20)
CO2: 25 mmol/L (ref 22–32)
Calcium: 8.4 mg/dL — ABNORMAL LOW (ref 8.9–10.3)
Chloride: 107 mmol/L (ref 98–111)
Creatinine, Ser: 0.91 mg/dL (ref 0.44–1.00)
GFR, Estimated: >60 mL/min (ref >60)
Glucose, Bld: 237 mg/dL — ABNORMAL HIGH (ref 70–99)
Potassium: 3.9 mmol/L (ref 3.5–5.1)
Sodium: 139 mmol/L (ref 135–145)
Total Bilirubin: 0.2 mg/dL — ABNORMAL LOW (ref 0.3–1.2)
Total Protein: 6.5 g/dL (ref 6.5–8.1)

## 2021-06-12 LAB — CBC
HCT: 31 % — ABNORMAL LOW (ref 36.0–46.0)
Hemoglobin: 9.5 g/dL — ABNORMAL LOW (ref 12.0–15.0)
MCH: 28.5 pg (ref 26.0–34.0)
MCHC: 30.6 g/dL (ref 30.0–36.0)
MCV: 93.1 fL (ref 80.0–100.0)
Platelets: 222 10*3/uL (ref 150–400)
RBC: 3.33 MIL/uL — ABNORMAL LOW (ref 3.87–5.11)
RDW: 14 % (ref 11.5–15.5)
WBC: 6.9 10*3/uL (ref 4.0–10.5)
nRBC: 0 % (ref 0.0–0.2)

## 2021-06-12 LAB — PHOSPHORUS: Phosphorus: 3.2 mg/dL (ref 2.5–4.6)

## 2021-06-12 LAB — SURGICAL PCR SCREEN
MRSA, PCR: NEGATIVE
Staphylococcus aureus: NEGATIVE

## 2021-06-12 LAB — TRIGLYCERIDES: Triglycerides: 107 mg/dL (ref <150)

## 2021-06-12 LAB — MAGNESIUM: Magnesium: 2.1 mg/dL (ref 1.7–2.4)

## 2021-06-12 SURGERY — LAPAROTOMY, EXPLORATORY
Anesthesia: General | Site: Abdomen

## 2021-06-12 MED ORDER — LIDOCAINE HCL (CARDIAC) PF 100 MG/5ML IV SOSY
PREFILLED_SYRINGE | INTRAVENOUS | Status: DC | PRN
  Administered 2021-06-12: 60 mg via INTRAVENOUS

## 2021-06-12 MED ORDER — PROPOFOL 10 MG/ML IV BOLUS
INTRAVENOUS | Status: AC
  Filled 2021-06-12: qty 20

## 2021-06-12 MED ORDER — SODIUM CHLORIDE 0.9 % IV SOLN: INTRAVENOUS | Status: DC | PRN

## 2021-06-12 MED ORDER — FENTANYL CITRATE (PF) 100 MCG/2ML IJ SOLN
INTRAMUSCULAR | Status: AC
  Filled 2021-06-12: qty 2

## 2021-06-12 MED ORDER — ACETAMINOPHEN 10 MG/ML IV SOLN
INTRAVENOUS | Status: AC
  Filled 2021-06-12: qty 100

## 2021-06-12 MED ORDER — BUPIVACAINE LIPOSOME 1.3 % IJ SUSP
INTRAMUSCULAR | Status: AC
  Filled 2021-06-12: qty 20

## 2021-06-12 MED ORDER — HYDROMORPHONE HCL 1 MG/ML IJ SOLN
0.5000 mg | INTRAMUSCULAR | Status: DC | PRN
  Administered 2021-06-12 – 2021-06-13 (×3): 0.5 mg via INTRAVENOUS
  Filled 2021-06-12 (×3): qty 0.5

## 2021-06-12 MED ORDER — SUCCINYLCHOLINE CHLORIDE 200 MG/10ML IV SOSY
PREFILLED_SYRINGE | INTRAVENOUS | Status: DC | PRN
  Administered 2021-06-12: 200 mg via INTRAVENOUS

## 2021-06-12 MED ORDER — ROCURONIUM BROMIDE 10 MG/ML (PF) SYRINGE
PREFILLED_SYRINGE | INTRAVENOUS | Status: AC
  Filled 2021-06-12: qty 10

## 2021-06-12 MED ORDER — INSULIN GLARGINE-YFGN 100 UNIT/ML ~~LOC~~ SOLN
10.0000 [IU] | Freq: Two times a day (BID) | SUBCUTANEOUS | Status: DC
  Administered 2021-06-12 – 2021-06-13 (×2): 10 [IU] via SUBCUTANEOUS
  Filled 2021-06-12 (×3): qty 0.1

## 2021-06-12 MED ORDER — PANTOPRAZOLE SODIUM 40 MG IV SOLR
40.0000 mg | INTRAVENOUS | Status: DC
  Administered 2021-06-13 – 2021-06-18 (×6): 40 mg via INTRAVENOUS
  Filled 2021-06-12 (×6): qty 10

## 2021-06-12 MED ORDER — SUGAMMADEX SODIUM 200 MG/2ML IV SOLN
INTRAVENOUS | Status: DC | PRN
  Administered 2021-06-12: 250 mg via INTRAVENOUS

## 2021-06-12 MED ORDER — PROPOFOL 10 MG/ML IV BOLUS
INTRAVENOUS | Status: DC | PRN
  Administered 2021-06-12: 100 mg via INTRAVENOUS

## 2021-06-12 MED ORDER — PHENYLEPHRINE HCL-NACL 20-0.9 MG/250ML-% IV SOLN
INTRAVENOUS | Status: AC
  Filled 2021-06-12: qty 250

## 2021-06-12 MED ORDER — PHENYLEPHRINE 80 MCG/ML (10ML) SYRINGE FOR IV PUSH (FOR BLOOD PRESSURE SUPPORT)
PREFILLED_SYRINGE | INTRAVENOUS | Status: AC
  Filled 2021-06-12: qty 10

## 2021-06-12 MED ORDER — KETAMINE HCL 10 MG/ML IJ SOLN
INTRAMUSCULAR | Status: DC | PRN
Start: 2021-06-12 — End: 2021-06-12
  Administered 2021-06-12: 20 mg via INTRAVENOUS
  Administered 2021-06-12: 30 mg via INTRAVENOUS

## 2021-06-12 MED ORDER — 0.9 % SODIUM CHLORIDE (POUR BTL) OPTIME
TOPICAL | Status: DC | PRN
  Administered 2021-06-12: 1000 mL

## 2021-06-12 MED ORDER — SODIUM CHLORIDE FLUSH 0.9 % IV SOLN
INTRAVENOUS | Status: AC
  Filled 2021-06-12: qty 10

## 2021-06-12 MED ORDER — SODIUM CHLORIDE (PF) 0.9 % IJ SOLN
INTRAMUSCULAR | Status: DC | PRN
  Administered 2021-06-12: 60 mL

## 2021-06-12 MED ORDER — CHLORHEXIDINE GLUCONATE 0.12 % MT SOLN
15.0000 mL | Freq: Two times a day (BID) | OROMUCOSAL | Status: DC
  Administered 2021-06-12 – 2021-06-19 (×14): 15 mL via OROMUCOSAL
  Filled 2021-06-12 (×15): qty 15

## 2021-06-12 MED ORDER — ROCURONIUM BROMIDE 100 MG/10ML IV SOLN
INTRAVENOUS | Status: DC | PRN
  Administered 2021-06-12: 10 mg via INTRAVENOUS
  Administered 2021-06-12: 30 mg via INTRAVENOUS
  Administered 2021-06-12: 60 mg via INTRAVENOUS

## 2021-06-12 MED ORDER — BUPIVACAINE-EPINEPHRINE (PF) 0.25% -1:200000 IJ SOLN
INTRAMUSCULAR | Status: AC
  Filled 2021-06-12: qty 30

## 2021-06-12 MED ORDER — KETOROLAC TROMETHAMINE 30 MG/ML IJ SOLN
INTRAMUSCULAR | Status: AC
  Filled 2021-06-12: qty 1

## 2021-06-12 MED ORDER — KETOROLAC TROMETHAMINE 30 MG/ML IJ SOLN
INTRAMUSCULAR | Status: DC | PRN
  Administered 2021-06-12: 30 mg via INTRAVENOUS

## 2021-06-12 MED ORDER — SEPRAFILM FOR OPTIME
ORAL_FILM | TOPICAL | Status: DC | PRN
  Administered 2021-06-12: 4 via TOPICAL

## 2021-06-12 MED ORDER — LACTATED RINGERS IV SOLN: INTRAVENOUS | Status: DC

## 2021-06-12 MED ORDER — FENTANYL CITRATE (PF) 100 MCG/2ML IJ SOLN
INTRAMUSCULAR | Status: DC | PRN
  Administered 2021-06-12 (×2): 50 ug via INTRAVENOUS

## 2021-06-12 MED ORDER — SUCCINYLCHOLINE CHLORIDE 200 MG/10ML IV SOSY
PREFILLED_SYRINGE | INTRAVENOUS | Status: AC
  Filled 2021-06-12: qty 10

## 2021-06-12 MED ORDER — ONDANSETRON HCL 4 MG/2ML IJ SOLN
INTRAMUSCULAR | Status: AC
  Filled 2021-06-12: qty 2

## 2021-06-12 MED ORDER — ACETAMINOPHEN 10 MG/ML IV SOLN
INTRAVENOUS | Status: DC | PRN
  Administered 2021-06-12: 1000 mg via INTRAVENOUS

## 2021-06-12 MED ORDER — TRAVASOL 10 % IV SOLN
INTRAVENOUS | Status: AC
  Filled 2021-06-12: qty 1188

## 2021-06-12 MED ORDER — PHENYLEPHRINE HCL-NACL 20-0.9 MG/250ML-% IV SOLN
INTRAVENOUS | Status: DC | PRN
  Administered 2021-06-12: 20 ug/min via INTRAVENOUS

## 2021-06-12 MED ORDER — PHENYLEPHRINE HCL (PRESSORS) 10 MG/ML IV SOLN
INTRAVENOUS | Status: DC | PRN
  Administered 2021-06-12: 160 ug via INTRAVENOUS
  Administered 2021-06-12 (×2): 80 ug via INTRAVENOUS
  Administered 2021-06-12: 240 ug via INTRAVENOUS

## 2021-06-12 MED ORDER — ORAL CARE MOUTH RINSE
15.0000 mL | Freq: Two times a day (BID) | OROMUCOSAL | Status: DC
  Administered 2021-06-12 – 2021-06-16 (×8): 15 mL via OROMUCOSAL

## 2021-06-12 MED ORDER — ENOXAPARIN SODIUM 60 MG/0.6ML IJ SOSY
0.5000 mg/kg | PREFILLED_SYRINGE | INTRAMUSCULAR | Status: DC
  Administered 2021-06-13 – 2021-06-16 (×3): 57.5 mg via SUBCUTANEOUS
  Filled 2021-06-12 (×4): qty 0.6

## 2021-06-12 MED ORDER — FENTANYL CITRATE (PF) 100 MCG/2ML IJ SOLN
25.0000 ug | INTRAMUSCULAR | Status: DC | PRN
  Administered 2021-06-12 (×2): 25 ug via INTRAVENOUS

## 2021-06-12 MED ORDER — ACETAMINOPHEN 10 MG/ML IV SOLN: 1000.0000 mg | Freq: Once | INTRAVENOUS | Status: DC | PRN

## 2021-06-12 MED ORDER — MIDAZOLAM HCL 2 MG/2ML IJ SOLN
INTRAMUSCULAR | Status: DC | PRN
  Administered 2021-06-12 (×2): 1 mg via INTRAVENOUS

## 2021-06-12 MED ORDER — MIDAZOLAM HCL 2 MG/2ML IJ SOLN
INTRAMUSCULAR | Status: AC
  Filled 2021-06-12: qty 2

## 2021-06-12 MED ORDER — ALBUMIN HUMAN 5 % IV SOLN: INTRAVENOUS | Status: DC | PRN

## 2021-06-12 MED ORDER — OXYCODONE HCL 5 MG PO TABS: 5.0000 mg | ORAL_TABLET | Freq: Once | ORAL | Status: DC | PRN

## 2021-06-12 MED ORDER — OXYCODONE HCL 5 MG/5ML PO SOLN: 5.0000 mg | Freq: Once | ORAL | Status: DC | PRN

## 2021-06-12 MED ORDER — SODIUM CHLORIDE 0.9 % IV SOLN
INTRAVENOUS | Status: AC
  Filled 2021-06-12: qty 2

## 2021-06-12 MED ORDER — DEXAMETHASONE SODIUM PHOSPHATE 10 MG/ML IJ SOLN
INTRAMUSCULAR | Status: AC
  Filled 2021-06-12: qty 1

## 2021-06-12 MED ORDER — SUGAMMADEX SODIUM 500 MG/5ML IV SOLN
INTRAVENOUS | Status: AC
  Filled 2021-06-12: qty 5

## 2021-06-12 MED ORDER — ALBUMIN HUMAN 5 % IV SOLN
INTRAVENOUS | Status: AC
  Filled 2021-06-12: qty 500

## 2021-06-12 MED ORDER — KETOROLAC TROMETHAMINE 30 MG/ML IJ SOLN
30.0000 mg | Freq: Four times a day (QID) | INTRAMUSCULAR | Status: AC
  Administered 2021-06-12 – 2021-06-17 (×19): 30 mg via INTRAVENOUS
  Filled 2021-06-12 (×19): qty 1

## 2021-06-12 MED ORDER — ONDANSETRON HCL 4 MG/2ML IJ SOLN: 4.0000 mg | Freq: Once | INTRAMUSCULAR | Status: DC | PRN

## 2021-06-12 MED ORDER — LIDOCAINE HCL (PF) 2 % IJ SOLN
INTRAMUSCULAR | Status: AC
  Filled 2021-06-12: qty 5

## 2021-06-12 MED ORDER — ONDANSETRON HCL 4 MG/2ML IJ SOLN
INTRAMUSCULAR | Status: DC | PRN
  Administered 2021-06-12: 4 mg via INTRAVENOUS

## 2021-06-12 MED ORDER — KETAMINE HCL 50 MG/5ML IJ SOSY
PREFILLED_SYRINGE | INTRAMUSCULAR | Status: AC
  Filled 2021-06-12: qty 5

## 2021-06-12 SURGICAL SUPPLY — 34 items
BINDER ABDOMINAL 12 ML 46-62 (SOFTGOODS) ×1 IMPLANT
CHLORAPREP W/TINT 26 (MISCELLANEOUS) ×2 IMPLANT
DRAPE LAPAROTOMY 100X77 ABD (DRAPES) ×2 IMPLANT
DRSG OPSITE POSTOP 4X12 (GAUZE/BANDAGES/DRESSINGS) ×1 IMPLANT
DRSG TEGADERM 4X10 (GAUZE/BANDAGES/DRESSINGS) IMPLANT
ELECT BLADE 6.5 EXT (BLADE) ×2 IMPLANT
ELECT CAUTERY BLADE TIP 2.5 (TIP) ×2
ELECT REM PT RETURN 9FT ADLT (ELECTROSURGICAL) ×2
ELECTRODE CAUTERY BLDE TIP 2.5 (TIP) ×1 IMPLANT
ELECTRODE REM PT RTRN 9FT ADLT (ELECTROSURGICAL) ×1 IMPLANT
GAUZE 4X4 16PLY ~~LOC~~+RFID DBL (SPONGE) ×2 IMPLANT
GAUZE SPONGE 4X4 12PLY STRL (GAUZE/BANDAGES/DRESSINGS) ×2 IMPLANT
GLOVE SURG SYN 7.0 (GLOVE) ×4 IMPLANT
GLOVE SURG SYN 7.0 PF PI (GLOVE) ×2 IMPLANT
GLOVE SURG SYN 7.5  E (GLOVE) ×4
GLOVE SURG SYN 7.5 E (GLOVE) ×2 IMPLANT
GLOVE SURG SYN 7.5 PF PI (GLOVE) ×2 IMPLANT
GOWN STRL REUS W/ TWL LRG LVL3 (GOWN DISPOSABLE) ×4 IMPLANT
GOWN STRL REUS W/TWL LRG LVL3 (GOWN DISPOSABLE) ×8
LABEL OR SOLS (LABEL) ×2 IMPLANT
LIGASURE IMPACT 36 18CM CVD LR (INSTRUMENTS) ×1 IMPLANT
MANIFOLD NEPTUNE II (INSTRUMENTS) ×2 IMPLANT
NEEDLE HYPO 22GX1.5 SAFETY (NEEDLE) ×2 IMPLANT
NS IRRIG 1000ML POUR BTL (IV SOLUTION) ×2 IMPLANT
PACK BASIN MAJOR ARMC (MISCELLANEOUS) ×2 IMPLANT
SEPRAFILM PROCEDURAL PACK 3X5 (MISCELLANEOUS) ×2 IMPLANT
SPONGE T-LAP 18X18 ~~LOC~~+RFID (SPONGE) ×5 IMPLANT
STAPLER SKIN PROX 35W (STAPLE) ×2 IMPLANT
SUT PDS AB 1 CT1 27 (SUTURE) ×2 IMPLANT
SUT SILK 3-0 (SUTURE) ×2 IMPLANT
SUT VIC AB 3-0 SH 27 (SUTURE) ×2
SUT VIC AB 3-0 SH 27X BRD (SUTURE) ×1 IMPLANT
SYR 10ML LL (SYRINGE) ×2 IMPLANT
TRAY FOLEY MTR SLVR 16FR STAT (SET/KITS/TRAYS/PACK) ×2 IMPLANT

## 2021-06-12 NOTE — Plan of Care (Signed)

## 2021-06-12 NOTE — Progress Notes (Signed)
Mobility Specialist - Progress Note    06/12/21 1100  Mobility  Activity Ambulated independently in hallway;Stood at bedside;Dangled on edge of bed  Level of Assistance Standby assist, set-up cues, supervision of patient - no hands on  Assistive Device Front wheel walker  Distance Ambulated (ft) 160 ft  Activity Response Tolerated well  $Mobility charge 1 Mobility    Pt long sitting upon arrival using RA. Completes bed mobility indep + extra time and STS supervision. Ambulates 1 lap with supervision voicing no complaints and is left in recliner with needs in reach, chair alarm set. RN notified.  Merrily Brittle Mobility Specialist 06/12/21, 11:14 AM

## 2021-06-12 NOTE — Transfer of Care (Signed)
Immediate Anesthesia Transfer of Care Note  Patient: [name]  Procedure(s) Performed: EXPLORATORY LAPAROTOMY, LYSIS OF ADHESIONS (Abdomen)  Patient Location: PACU  Anesthesia Type:General  Level of Consciousness: awake, alert  and oriented  Airway & Oxygen Therapy: Patient Spontanous Breathing and Patient connected to face mask oxygen  Post-op Assessment: Report given to RN and Post -op Vital signs reviewed and stable  Post vital signs: Reviewed and stable  Last Vitals:  Vitals Value Taken Time  BP 120/68 06/12/21 1824  Temp 36.4 C 06/12/21 1824  Pulse 86 06/12/21 1829  Resp 18 06/12/21 1829  SpO2 98 % 06/12/21 1829    Last Pain:  Vitals:   06/12/21 1824  TempSrc:   PainSc: 0-No pain      Patients Stated Pain Goal: 0 (27/67/01 1003)  Complications: No notable events documented.

## 2021-06-12 NOTE — Progress Notes (Addendum)
Nutrition Follow-up  DOCUMENTATION CODES:   Morbid obesity  INTERVENTION:   -TPN management per pharmacy  NUTRITION DIAGNOSIS:   Inadequate oral intake related to altered GI function as evidenced by NPO status.  Ongoing  GOAL:   Patient will meet greater than or equal to 90% of their needs  Met with TPN  MONITOR:   Diet advancement  REASON FOR ASSESSMENT:   Consult New TPN/TNA  ASSESSMENT:   PTwith history of hypertension, hyperlipidemia, multiple hospitalization for small bowel obstructions, history of incisional hernia repair in 2013, anxiety, depression, asthma, history of idiopathic epilepsy/petit mall with last seizure being in 2012, controlled with Topamax, GERD, left eye legally blindness, sleep apnea who does not use CPAP at home, stress incontinence, who presents for chief concerns of generalized weakness, decreased appetite, nausea, vomiting.  5/22- NGT placed for decompression 5/23- PICC placed, TPN initiated  Reviewed I/O's: +1.6 L x 24 hours and +145 ml since admission  UOP: 0 ml x24 hours  NGT output: 850 ml x 24 hours   Per general surgery notes, NGT output is decreased and pt having flatus. Plan for ex lap with possible LOA or bowel resection today.   Pt remains on TPN, which has been advanced to goal rate of 90 ml/hr, providing 2225 kcals and 119 grams protein, which meets 100% of estimated nutritional needs.   Medications reviewed and include vitamin D3 and keppra.   Labs reviewed: K, Mg, and Phos WDL. CBGS: 157-235 (inpatient orders for glycemic control are 0-20 units insulin aspart every 4 hours and 5 units insulin glargine-yfgn daily).    Diet Order:   Diet Order             Diet NPO time specified Except for: Sips with Meds, Ice Chips  Diet effective midnight                   EDUCATION NEEDS:   Education needs have been addressed  Skin:  Skin Assessment: Reviewed RN Assessment  Last BM:  Unknown  Height:   Ht Readings  from Last 1 Encounters:  06/08/21 5' 6.5" (1.689 m)    Weight:   Wt Readings from Last 1 Encounters:  06/08/21 116.1 kg    Ideal Body Weight:  62.5 kg  BMI:  Body mass index is 40.7 kg/m.  Estimated Nutritional Needs:   Kcal:  2150-2350  Protein:  110-125 grams  Fluid:  > 2 L    Loistine Chance, RD, LDN, [place] Registered Dietitian II Certified Diabetes Care and Education Specialist Please refer to [facility] for RD and/or RD on-call/weekend/after hours pager

## 2021-06-12 NOTE — Op Note (Signed)
Procedure Date:  06/12/2021  Pre-operative Diagnosis:  Possible small bowel obstruction  Post-operative Diagnosis: Possible inflammatory bowel disease  Procedure:  Exploratory Laparotomy, lysis of adhesions  Surgeon:  Melvyn Neth, MD  Anesthesia:  General endotracheal  Estimated Blood Loss:  50 ml  Specimens:  None  Complications:  None  Findings:  The patient had adhesions of the omentum to the anterior abdominal wall particularly around the prior mesh.  When running the small bowel, the patient was noted to have creeping fat in the terminal ileum, which raises suspicion for Crohn's disease.  There were no areas of significant ileitis, and no areas of stricture.     Indications for Procedure:  This is a 56 y.o. female who presents with recurrent abdominal pain, with 3 admissions in about 6-8 weeks.  CT scans have shown concern for small bowel obstruction vs ileus.  Given her repeated episodes, I discussed with the patient the role for exploratory laparotomy to identify a possible source of obstruction.  The risks of bleeding, abscess or infection, injury to surrounding structures, and need for further procedures were all discussed with the patient and was willing to proceed.  Description of Procedure: The patient was correctly identified in the preoperative area and brought into the operating room.  The patient was placed supine with VTE prophylaxis in place.  Appropriate time-outs were performed.  Anesthesia was induced and the patient was intubated.  Foley catheter was placed.  Appropriate antibiotics were infused.  The abdomen was prepped and draped in a sterile fashion.  A midline incision was made and electrocautery was used to dissect down the subcutaneous tissue to the fascia.  The fascia with the underlying mesh from her hernia repair was incised and extended superiorly and inferiorly.  The patient had a significant amount of adhesions of the omentum to the anterior  abdominal wall, particularly around the area of the patient's mesh.  These were taken down meticulously using combination of cautery and LigaSure.  There was no injury to bowel in doing this.  After adhesions were lysed, the bowel was explored.  The transverse colon was distended with air.  The small bowel was identified and run from the Ligament of Treitz to the ileocecal valve.  There was no significant adhesions between the bowel loops.  However, approaching distally in the terminal ileum, the bowel was noted to have creeping fat of the mesentery, with thickened mesentery, which raises suspicion for Crohn's disease.  No significant bowel thickening or ileitis was identified, and the appendix was not inflamed either.  The colon was then evaluated and no areas of narrowing or inflammation were found.  After that, the patient's NG tube was replaced and palpation of the stomach revealed good position.  Small bowel contents were milked back to the stomach by running the bowel backwards from terminal ileum back to Ligament of Treitz.  The abdominal cavity was irrigated with warm saline, and Seprafilm was placed in all quadrants.  60 ml of Exparel solution mixed with 0.5% bupivacaine with epi was infiltrated over the peritoneum, fascia, and subcutaneous tissue.  The fascia was then closed incorporating the mesh again using #1 PDS sutures.  The midline wound was irrigated and closed in layers using 3-0 Vicryl and staples for the skin.  The wound was cleaned and dressed with Honeycomb dressing.  The patient was emerged from anesthesia and extubated and brought to the recovery room for further management.  The patient tolerated the procedure well and all counts  were correct at the end of the case.   Melvyn Neth, MD

## 2021-06-12 NOTE — Care Management Important Message (Signed)
Important Message  Patient Details  Name: [name] MRN: [id number] Date of Birth: 1965-03-12   Medicare Important Message Given:  Yes     Dannette Barbara 06/12/2021, 11:34 AM

## 2021-06-12 NOTE — Anesthesia Postprocedure Evaluation (Signed)
Anesthesia Post Note  Patient: [name]  Procedure(s) Performed: EXPLORATORY LAPAROTOMY, LYSIS OF ADHESIONS (Abdomen)  Patient location during evaluation: PACU Anesthesia Type: General Level of consciousness: awake and alert Pain management: pain level controlled Vital Signs Assessment: post-procedure vital signs reviewed and stable Respiratory status: spontaneous breathing, nonlabored ventilation, respiratory function stable and patient connected to nasal cannula oxygen Cardiovascular status: blood pressure returned to baseline and stable Postop Assessment: no apparent nausea or vomiting Anesthetic complications: no   No notable events documented.   Last Vitals:  Vitals:   06/12/21 1846 06/12/21 1900  BP: 125/68 127/74  Pulse: 85 83  Resp: 19 (!) 25  Temp:    SpO2: 93% 91%    Last Pain:  Vitals:   06/12/21 1900  TempSrc:   PainSc: [place]

## 2021-06-12 NOTE — Anesthesia Preprocedure Evaluation (Signed)
Anesthesia Evaluation  Patient identified by MRN, date of birth, ID band Patient awake    Reviewed: Allergy & Precautions, NPO status , Patient's Chart, lab work & pertinent test results  History of Anesthesia Complications Negative for: history of anesthetic complications  Airway Mallampati: III  TM Distance: >3 FB Neck ROM: Full    Dental no notable dental hx. (+) Teeth Intact   Pulmonary sleep apnea , COPD, Patient abstained from smoking.Not current smoker, former smoker,    Pulmonary exam normal breath sounds clear to auscultation       Cardiovascular Exercise Tolerance: Good METShypertension, + CAD, + Past MI, + Peripheral Vascular Disease and +CHF  (-) dysrhythmias  Rhythm:Regular Rate:Normal - Systolic murmurs Study Conclusions   - Left ventricle: The cavity size was normal. Wall thickness was  increased in a pattern of mild LVH. Systolic function was normal.  The estimated ejection fraction was in the range of 60% to 65%.  Wall motion was normal; there were no regional wall motion  abnormalities. Doppler parameters are consistent with abnormal  left ventricular relaxation (grade 1 diastolic dysfunction).  - Aortic valve: There was mild regurgitation.  - Aorta: Aortic root dimension: 38 mm (ED).  - Ascending aorta: The ascending aorta was mildly dilated.    Neuro/Psych  Headaches, Seizures -, Well Controlled,  PSYCHIATRIC DISORDERS Anxiety Depression Residual left sided weakness  Neuromuscular disease CVA, Residual Symptoms    GI/Hepatic GERD  Medicated and Controlled,(+)     (-) substance abuse  ,   Endo/Other  diabetes, Insulin DependentMorbid obesity  Renal/GU CRFRenal disease     Musculoskeletal  (+) Arthritis ,   Abdominal (+) + obese,   Peds  Hematology   Anesthesia Other Findings Past Medical History: 09/13/2006: Anxiety and depression     Comment:  Qualifier: Diagnosis of  By: Hassell Done  [name], Tori Milks   No date: Arthritis     Comment:  joint pain  No date: Asthma No date: COPD (chronic obstructive pulmonary disease) (HCC)     Comment:  chronic bronchitis  No date: Depression No date: Diabetes mellitus     Comment:  since age 35; type 2 IDDM No date: Diabetic neuropathy, painful ([place])     Comment:  FEET AND HANDS No date: Diabetic retinopathy ([place]) No date: Diastolic dysfunction     Comment:  a.  Echo 11/17: EF 60-65%, mild LVH, no RWMA, Gr1DD,               mild AI, dilated aortic root measuring 38 mm, mildly               dilated ascending aorta No date: Dilated aortic root (HCC)     Comment:  3m by echo 11/2015 No date: Dizziness     Comment:  secondary to diabetes and hypertension  No date: Epilepsy idiopathic petit mal ([place]     Comment:  last seizure 2012;controlled w/ topomax No date: GERD (gastroesophageal reflux disease) No date: Glaucoma     Comment:  NOT ON ANY EYE DROPS  No date: Headache(784.0)     Comment:  migraines  No date: Heart murmur     Comment:  born with  No date: History of stress test     Comment:  a. 11/17: Normal perfusion, EF 53%, normal study No date: Hx of blood clots     Comment:  hematomas removed from left side of brain from 114moto  56yr old  No date: Hyperlipidemia No date: Hypertension No date: Legally blind     Comment:  left eye  09/14/2006: MIGRAINE HEADACHE     Comment:  Qualifier: Diagnosis of  By: MHassell DoneFNP, [place]   No date: Myocardial infarction ([place]     Comment:  a.  Patient reported history of without objective               documentation No date: Nephrolithiasis     Comment:  frequent urination , urination at nite  PT SEEN IN ER               09/22/11 FOR BACK AND RT SIDED PAIN--HAS KNOWN STONE RT               URETER AND UA IN ER SHOWED UTI 09/23/11: Pain     Comment:  LOWER BACK AND RIGHT SIDE--PT HAS RIGHT URETERAL STONE No date: Pneumonia     Comment:  hx of 2009 No date:  Pyelonephritis No date: Seizures ([place] No date: Sleep apnea     Comment[facility] ; mild No date: Stress incontinence No date: Stroke ([facility]     Comment:  last 2003  RESIDUAL LEFT LEG WEAKNESS--NO OTHER RESIDUAL              PROBLEMS  Reproductive/Obstetrics                             Anesthesia Physical Anesthesia Plan  ASA: 3  Anesthesia Plan: General   Post-op Pain Management: Ofirmev IV (intra-op)*   Induction: Intravenous and Rapid sequence  PONV Risk Score and Plan: 4 or greater and Ondansetron, Dexamethasone and Midazolam  Airway Management Planned: Oral ETT  Additional Equipment: None  Intra-op Plan:   Post-operative Plan: Extubation in OR  Informed Consent: I have reviewed the patients History and Physical, chart, labs and discussed the procedure including the risks, benefits and alternatives for the proposed anesthesia with the patient or authorized representative who has indicated his/her understanding and acceptance.     Dental advisory given  Plan Discussed with: CRNA and Surgeon  Anesthesia Plan Comments: (Discussed risks of anesthesia with patient, including PONV, sore throat, lip/dental/eye damage. Rare risks discussed as well, such as cardiorespiratory and neurological sequelae, and allergic reactions. Discussed the role of CRNA in patient's perioperative care. Patient understands.)        Anesthesia Quick Evaluation

## 2021-06-12 NOTE — Progress Notes (Signed)
Progress Note   Patient: [name] FAO:[id number] DOB: 06/10/1965 DOA: 06/08/2021     3 DOS: the patient was seen and examined on 06/12/2021   Brief hospital course: [name] is a 56 year old female with history of hypertension, hyperlipidemia, multiple hospitalization for small bowel obstructions, history of incisional hernia repair in 2013, anxiety, depression, asthma, history of idiopathic epilepsy/petit mall with last seizure being in 2012, controlled with Topamax, GERD, left eye legally blindness, sleep apnea who does not use CPAP at home, stress incontinence, who presents emergency department for chief concerns of generalized weakness, decreased appetite, nausea, vomiting.   5/22: Patient continued to have abdominal pain with nausea and vomiting.  Compazine was also added with Zofran. Patient was recently seen by general surgery and MR with enterography was ordered and scheduled for 5/28. Worsening leukocytosis. NG tube was placed with significant drainage. General surgery was also consulted for concern of another SBO. She will be most likely going to the OR for exploratory laparotomy as this is her third admission for similar reasons in the past 13-month   5/23: Symptoms are improving with NG tube, significant drainage of above 2 L in the past 24-hour.  Continue to have some diarrhea.  General surgery is planning exploratory laparotomy on 06/12/2021. PICC line to be placed today followed by starting TPN. Renal functions improving, creatinine at 1.05 today. Leukocytosis resolved, all cell lines decreased so there must be some dilutional effect. C. difficile and GI pathogen panel negative.   Assessment and Plan: * Intractable nausea and vomiting SBO Improved with NG tube placement.  Significant NG tube output persists. - Multifactorial including possible small bowel obstruction/ileus in setting of GERD. -General surgery consult as she is a known patient to them,  due to recurrent admissions for similar reasons and history of multiple abdominal surgeries patient will be going to the OR on 06/12/2021. PICC line was ordered by general surgery and patient started TPN 5/24. Labs this morning wnl -Continue Compazine with Zofran -Continue with pain management -Continue TPN  Hyperkalemia Resolved with IV fluid and discontinuation of spironolactone. -Continue to monitor  Seizure disorder (HCC) - Resumed home Keppra 500 mg daily, 750 mg nightly; gabapentin 600 mg twice daily, 1200 mg nightly - Fall precautions  AKI (acute kidney injury) ([place] Resolved. Presumed secondary to prerenal in setting of poor p.o. intake in setting of nausea and vomiting and diarrhea - Monitor renal function -Avoid nephrotoxins.  OSA (obstructive sleep apnea) - CPAP nightly ordered  Essential hypertension, benign Blood pressure wnl -Continue home dose of carvedilol and hydralazine. -Hold spironolactone due to hyperkalemia -Holding home Benicar for AKI-can be resumed if needed  GERD without esophagitis - Protonix 40 mg IV daily  Type 2 diabetes mellitus with chronic kidney disease, with long-term current use of insulin (HCC) CBG elevated - Patient's insulin dosing is reported as insulin lispro, inject 160 units daily in divided doses as directed, Trulicity weekly injection -  SSI with at bedtime coverage -Added Semglee 5 units twice daily, will increase today to 10  Obesity, Class III, BMI 40-49.9 (morbid obesity) ([place] Estimated body mass index is 40.7 kg/m as calculated from the following:   Height as of this encounter: 5' 6.5" (1.689 m).   Weight as of this encounter: 116.1 kg. This will complicate overall prognosis.  CAD (coronary artery disease) - Rosuvastatin 10 mg nightly, spironolactone 25 mg daily, carvedilol 25 mg p.o. twice daily have been resumed - I have not resumed Plavix 75 mg daily  due to ileus versus small bowel obstruction - Per med reconciliation  patient last dose of Plavix was 06/08/2021  Major depressive disorder, recurrent episode, moderate (HCC) - Resumed home duloxetine 60 mg daily, amitriptyline 70 to 75 mg nightly  Diarrhea Resolved. C diff and gi pathogen panel neg  Dyslipidemia - Ezetimibe 10 mg daily, rosuvastatin 10 mg nightly resumed     Subjective: Patient was feeling little improved today.  Denies any more nausea, vomiting or pain. Mild abd discomfort  Physical Exam: Vitals:   06/11/21 2349 06/12/21 0409 06/12/21 0726 06/12/21 0747  BP: (!) 146/63 114/86  (!) 147/72  Pulse: 81 79 81 78  Resp:  18 16 18   Temp:  97.6 F (36.4 C)  98 F (36.7 C)  TempSrc:  Oral    SpO2: 100% 96% 95% 98%  Weight:      Height:       General.  Morbidly obese lady, in no acute distress. Pulmonary.  Lungs clear bilaterally, normal respiratory effort. CV.  Regular rate and rhythm, no JVD, rub or murmur. Abdomen.  Soft, nontender, mild distention, BS positive. CNS.  Alert and oriented .  No focal neurologic deficit. Extremities.  No edema, no cyanosis, pulses intact and symmetrical. Psychiatry.  Judgment and insight appears normal.  Data Reviewed: Prior notes, labs and images reviewed.  Family Communication: Discussed with patient  Disposition: Status is: Inpatient Remains inpatient appropriate because: Severity of illness   Planned Discharge Destination: Home  DVT prophylaxis.  Lovenox Time spent: 30 minutes   Author: Desma Maxim, MD 06/12/2021 11:30 AM  For on call review [email or web address].

## 2021-06-12 NOTE — Progress Notes (Signed)
PHARMACY - TOTAL PARENTERAL NUTRITION CONSULT NOTE   Indication: Small bowel obstruction  Patient Measurements: Height: 5' 6.5" (168.9 cm) Weight: 116.1 kg (256 lb) IBW/kg (Calculated) : 60.45 TPN AdjBW (KG): 74.4 Body mass index is 40.7 kg/m. Usual Weight:    Assessment: Pt admitted with intractable nausea ad vomiting secondary to SBO,  hx multiple hospitalization for small bowel obstructions  Glucose / Insulin: BG 144-235 SSI resistant q4h,  Glargine 5 units BID (Insulin per last 24 hrs: 32 units SSI, 10 units glargine) -was on insulin pump PTA  Electrolytes: WNL Renal: Scr 0.91 Hepatic: AST 12/ALT 13 Intake / Output; TG: 107 MIVF: none GI Imaging: GI Surgeries / Procedures: Plan for surgical intervention on Thursday (06/12/21).   Central access: PICC placed 5/23 TPN start date: 5/23  Nutritional Goals: Goal TPN rate is 90 mL/hr (provides 118.8 g of protein and 2224 kcals per day),  fluids 2160 ml/24 hrs  RD Assessment: Estimated Needs Total Energy Estimated Needs: 2150-2350 Total Protein Estimated Needs: 110-125 grams Total Fluid Estimated Needs: > 2 L  Current Nutrition:  NPO  Plan:  -Electrolytes in TPN: Na 73mq/L, K 57m/L, Ca 40m74mL, Mg 40mE27m, and Phos 140mm76m. Cl:Ac 1:1 -Add standard MVI and trace elements to TPN  -Continue TPN at full rate 90ml/7mcontinue Resistant q4h SSI. -Recommend increasing insulin glargine 5 bid to 5u am and 10u pm -Monitor TPN labs for 1st 3 days after initiation then on Mon/Thurs  [employer]2023,7:35 AM

## 2021-06-12 NOTE — Anesthesia Procedure Notes (Signed)
Procedure Name: Intubation Date/Time: 06/12/2021 3:32 PM Performed by: Debe Coder, CRNA Pre-anesthesia Checklist: Patient identified, Emergency Drugs available, Suction available and Patient being monitored Patient Re-evaluated:Patient Re-evaluated prior to induction Oxygen Delivery Method: Circle system utilized Preoxygenation: Pre-oxygenation with 100% oxygen Induction Type: IV induction Ventilation: Mask ventilation without difficulty Laryngoscope Size: McGraph and 4 Grade View: Grade II Tube type: Oral Tube size: 6.5 mm Number of attempts: 1 Airway Equipment and Method: Stylet and Oral airway Placement Confirmation: ETT inserted through vocal cords under direct vision, positive ETCO2 and breath sounds checked- equal and bilateral Secured at: 21 cm Tube secured with: Tape Dental Injury: Teeth and Oropharynx as per pre-operative assessment

## 2021-06-12 NOTE — Progress Notes (Signed)
06/12/2021  Subjective: No acute events.  Patient's NG output is decreased and she reports having flatus.  However, she still has soreness/tenderness in the mid abdomen.   Vital signs: Temp:  [97.6 F (36.4 C)-98.7 F (37.1 C)] 98 F (36.7 C) (05/25 0747) Pulse Rate:  [78-82] 78 (05/25 0747) Resp:  [16-20] 18 (05/25 0747) BP: (114-190)/(63-99) 147/72 (05/25 0747) SpO2:  [95 %-100 %] 98 % (05/25 0747)   Intake/Output: 05/24 0701 - 05/25 0700 In: 1117 [P.O.:240; I.V.:2135; NG/GT:60] Out: 850 [Emesis/NG output:850] Last BM Date : 06/09/21  Physical Exam: Constitutional: No acute distress Abdomen:  soft, obese, somewhat distended, with mild tenderness in mid to left abdomen.  No peritonitis.  NG in place with clear contents.  Labs:  Recent Labs    06/10/21 0413 06/12/21 0412  WBC 10.4 6.9  HGB 10.1* 9.5*  HCT 32.7* 31.0*  PLT 268 222   Recent Labs    06/11/21 0355 06/12/21 0412  NA 142 139  K 4.0 3.9  CL 109 107  CO2 28 25  GLUCOSE 215* 237*  BUN 18 15  CREATININE 0.93 0.91  CALCIUM 8.3* 8.4*   No results for input(s): LABPROT, INR in the last 72 hours.  Imaging: No results found.  Assessment/Plan: This is a 56 y.o. female with sbo presentation now multiple times in the past 6-8 weeks.   --Discussed patient that we would continue the plan for exploratory laparotomy today, with possible lysis of adhesions or possible bowel resection.  Discussed again the surgery at length and she had no further questions. --Continue NG tube to suction, TPN for nutrition.  Anticipate likely post-operative ileus.    --Agree with holding DVT prophylaxis today given her surgery. --Continue GI prophylaxis with PPI.   I spent 35 minutes dedicated to the care of this patient on the date of this encounter to include pre-visit review of records, face-to-face time with the patient discussing diagnosis and management, and any post-visit coordination of care.  Melvyn Neth,  [facility]

## 2021-06-13 ENCOUNTER — Encounter: Payer: Self-pay | Admitting: Surgery

## 2021-06-13 DIAGNOSIS — R112 Nausea with vomiting, unspecified: Secondary | ICD-10-CM | POA: Diagnosis not present

## 2021-06-13 LAB — CBC
HCT: 33 % — ABNORMAL LOW (ref 36.0–46.0)
Hemoglobin: 10.1 g/dL — ABNORMAL LOW (ref 12.0–15.0)
MCH: 28.5 pg (ref 26.0–34.0)
MCHC: 30.6 g/dL (ref 30.0–36.0)
MCV: 93.2 fL (ref 80.0–100.0)
Platelets: 242 10*3/uL (ref 150–400)
RBC: 3.54 MIL/uL — ABNORMAL LOW (ref 3.87–5.11)
RDW: 14.1 % (ref 11.5–15.5)
WBC: 10 10*3/uL (ref 4.0–10.5)
nRBC: 0.2 % (ref 0.0–0.2)

## 2021-06-13 LAB — BASIC METABOLIC PANEL
Anion gap: 5 (ref 5–15)
BUN: 26 mg/dL — ABNORMAL HIGH (ref 6–20)
CO2: 24 mmol/L (ref 22–32)
Calcium: 8.5 mg/dL — ABNORMAL LOW (ref 8.9–10.3)
Chloride: 108 mmol/L (ref 98–111)
Creatinine, Ser: 1.08 mg/dL — ABNORMAL HIGH (ref 0.44–1.00)
GFR, Estimated: >60 mL/min (ref >60)
Glucose, Bld: 281 mg/dL — ABNORMAL HIGH (ref 70–99)
Potassium: 5 mmol/L (ref 3.5–5.1)
Sodium: 137 mmol/L (ref 135–145)

## 2021-06-13 LAB — GLUCOSE, CAPILLARY
Glucose-Capillary: 219 mg/dL — ABNORMAL HIGH (ref 70–99)
Glucose-Capillary: 225 mg/dL — ABNORMAL HIGH (ref 70–99)
Glucose-Capillary: 250 mg/dL — ABNORMAL HIGH (ref 70–99)
Glucose-Capillary: 263 mg/dL — ABNORMAL HIGH (ref 70–99)
Glucose-Capillary: 271 mg/dL — ABNORMAL HIGH (ref 70–99)
Glucose-Capillary: 280 mg/dL — ABNORMAL HIGH (ref 70–99)

## 2021-06-13 MED ORDER — INSULIN GLARGINE-YFGN 100 UNIT/ML ~~LOC~~ SOLN
15.0000 [IU] | Freq: Two times a day (BID) | SUBCUTANEOUS | Status: DC
  Administered 2021-06-13 – 2021-06-15 (×4): 15 [IU] via SUBCUTANEOUS
  Filled 2021-06-13 (×5): qty 0.15

## 2021-06-13 MED ORDER — POLYETHYLENE GLYCOL 3350 17 G PO PACK
17.0000 g | PACK | Freq: Two times a day (BID) | ORAL | Status: DC
  Administered 2021-06-13 – 2021-06-19 (×9): 17 g via ORAL
  Filled 2021-06-13 (×12): qty 1

## 2021-06-13 MED ORDER — CLOPIDOGREL BISULFATE 75 MG PO TABS
75.0000 mg | ORAL_TABLET | Freq: Every day | ORAL | Status: DC
  Administered 2021-06-14 – 2021-06-19 (×6): 75 mg via ORAL
  Filled 2021-06-13 (×6): qty 1

## 2021-06-13 MED ORDER — TRAVASOL 10 % IV SOLN
INTRAVENOUS | Status: AC
  Filled 2021-06-13: qty 1188

## 2021-06-13 MED ORDER — DOCUSATE SODIUM 100 MG PO CAPS
100.0000 mg | ORAL_CAPSULE | Freq: Two times a day (BID) | ORAL | Status: DC
  Administered 2021-06-13 – 2021-06-19 (×12): 100 mg via ORAL
  Filled 2021-06-13 (×13): qty 1

## 2021-06-13 MED ORDER — HYDROMORPHONE HCL 1 MG/ML IJ SOLN
1.0000 mg | INTRAMUSCULAR | Status: DC | PRN
  Administered 2021-06-13 – 2021-06-18 (×15): 1 mg via INTRAVENOUS
  Filled 2021-06-13 (×15): qty 1

## 2021-06-13 MED ORDER — FLEET ENEMA 7-19 GM/118ML RE ENEM
1.0000 | ENEMA | Freq: Every day | RECTAL | Status: AC
  Administered 2021-06-13 – 2021-06-15 (×2): 1 via RECTAL

## 2021-06-13 NOTE — Evaluation (Signed)
Physical Therapy Evaluation Patient Details Name: [name] MRN: [id number] DOB: [birth year] Today's Date: 06/13/2021  History of Present Illness  [name] is a 56 year old female with history of hypertension, hyperlipidemia, multiple hospitalization for small bowel obstructions, history of incisional hernia repair in 2013, anxiety, depression, asthma, history of idiopathic epilepsy/petit mall with last seizure being in 2012, controlled with Topamax, GERD, left eye legally blindness, sleep apnea who does not use CPAP at home, stress incontinence, who presents emergency department for chief concerns of generalized weakness, decreased appetite, nausea, vomiting.   Clinical Impression  Pt admitted with above diagnosis. Pt received upright in bed agreeable to PT services. Pt reports 8/10 pain from incision and L side of her body (normal from hx of CVA's affecting L side).  At baseline pt reports she is mod-I with household ambulation with QC and mod-I with short community distances with her rollator. Has 24/7 family assist at home from daughter and SIL. To date pt is supervision for bed mobility with bed features and increased time. Education and min multimodal cuing provided for log roll technique to assist pt in pain management from ex lap procedure. Pt performing well post education and with cues and able to stand to RW with supervision and safe hand placement. Pt ambulating in room ~18' supervision with safe and correct use of RW ambulating at a slowed gait velcity but consistent step through cadence with decreased step lengths bilat. Pt able to return to EOB with VC's for safe hand placement and return to supine in bed with log roll technique with all needs in reach. Pt demonstrating decreased tolerance for ambulation after ex lap procedure due to pain but is overall moving safely. PT to rec d/c home with [facility] PT services at this time. Pt currently with functional limitations due to the  deficits listed below (see PT Problem List). Pt will benefit from skilled PT to increase their independence and safety with mobility to allow discharge to the venue listed below.          Recommendations for follow up therapy are one component of a multi-disciplinary discharge planning process, led by the attending physician.  Recommendations may be updated based on patient status, additional functional criteria and insurance authorization.  Follow Up Recommendations Home health PT    Assistance Recommended at Discharge Intermittent Supervision/Assistance  Patient can return home with the following  A little help with walking and/or transfers;A little help with bathing/dressing/bathroom;Assist for transportation;Help with stairs or ramp for entrance    Equipment Recommendations None recommended by PT  Recommendations for Other Services       Functional Status Assessment Patient has had a recent decline in their functional status and demonstrates the ability to make significant improvements in function in a reasonable and predictable amount of time.     Precautions / Restrictions Precautions Precautions: Fall Restrictions Weight Bearing Restrictions: No      Mobility  Bed Mobility Overal bed mobility: Needs Assistance Bed Mobility: Supine to Sit, Sit to Supine     Supine to sit: Supervision, HOB elevated Sit to supine: Supervision, HOB elevated   General bed mobility comments: increased time with bed features Patient Response: Cooperative  Transfers                        Ambulation/Gait Ambulation/Gait assistance: Supervision Gait Distance (Feet): 18 Feet Assistive device: Rolling walker (2 wheels) Gait Pattern/deviations: Step-through pattern, Decreased step length - right, Decreased step length -  left       General Gait Details: slow but reciprocal step through pattern with RW.  Stairs            Wheelchair Mobility    Modified Rankin (Stroke  Patients Only)       Balance Overall balance assessment: Needs assistance Sitting-balance support: Bilateral upper extremity supported, Feet supported Sitting balance-Leahy Scale: Fair     Standing balance support: No upper extremity supported Standing balance-Leahy Scale: Fair Standing balance comment: Able to stand without UE support on RW                             Pertinent Vitals/Pain Pain Assessment Pain Assessment: 0-10 Pain Score: 8  Pain Location: abdominal incisions from ex lap Pain Descriptors / Indicators: Dull, Grimacing, Discomfort Pain Intervention(s): Limited activity within patient's tolerance, Monitored during session, Premedicated before session, Repositioned    Home Living Family/patient expects to be discharged to:: Private residence Living Arrangements: Children;Other relatives Available Help at Discharge: Family Type of Home: House Home Access: Stairs to enter Entrance Stairs-Rails:  (banisters but no rails) Entrance Stairs-Number of Steps: 3   Home Layout: One level Home Equipment: Cane - quad;Cane - single point;Rollator (4 wheels);BSC/3in1;Shower seat;Grab bars - tub/shower      Prior Function Prior Level of Function : Independent/Modified Independent             Mobility Comments: relies on QC and rollator for mobility ADLs Comments: Has family 24/7 for intermittent assist. DOes not elaborate on which ADL's     Hand Dominance        Extremity/Trunk Assessment   Upper Extremity Assessment Upper Extremity Assessment: Overall WFL for tasks assessed    Lower Extremity Assessment Lower Extremity Assessment: Generalized weakness    Cervical / Trunk Assessment Cervical / Trunk Assessment: Normal  Communication   Communication: No difficulties  Cognition Arousal/Alertness: Awake/alert Behavior During Therapy: WFL for tasks assessed/performed Overall Cognitive Status: Within Functional Limits for tasks assessed                                  General Comments: Pleasant and cooperative. Follows all commands.        General Comments      Exercises Other Exercises Other Exercises: Role of PT in acute setting, d/c recs, log roll tehcnique for pain control with bed mobility s/p ex lap.   Assessment/Plan    PT Assessment Patient needs continued PT services  PT Problem List Decreased strength;Decreased mobility;Obesity;Decreased activity tolerance;Pain       PT Treatment Interventions DME instruction;Therapeutic exercise;Gait training;Balance training;Stair training;Neuromuscular re-education;Functional mobility training;Therapeutic activities;Patient/family education    PT Goals (Current goals can be found in the Care Plan section)  Acute Rehab PT Goals Patient Stated Goal: to go home PT Goal Formulation: With patient Time For Goal Achievement: 06/27/21 Potential to Achieve Goals: Good    Frequency Min 2X/week     Co-evaluation               AM-PAC PT "6 Clicks" Mobility  Outcome Measure Help needed turning from your back to your side while in a flat bed without using bedrails?: A Little Help needed moving from lying on your back to sitting on the side of a flat bed without using bedrails?: A Lot Help needed moving to and from a bed to a chair (including a wheelchair)?:  A Little Help needed standing up from a chair using your arms (e.g., wheelchair or bedside chair)?: A Little Help needed to walk in hospital room?: A Little Help needed climbing 3-5 steps with a railing? : A Lot 6 Click Score: 16    End of Session Equipment Utilized During Treatment: Gait belt Activity Tolerance: Patient tolerated treatment well Patient left: in bed;with call bell/phone within reach;with bed alarm set;with SCD's reapplied Nurse Communication: Mobility status PT Visit Diagnosis: Other abnormalities of gait and mobility (R26.89);Muscle weakness (generalized) (M62.81)    Time:  9030-0923 PT Time Calculation (min) (ACUTE ONLY): 25 min   Charges:   PT Evaluation $PT Eval Moderate Complexity: 1 Mod PT Treatments $Therapeutic Activity: 8-22 mins       Tkeya Stencil M. Fairly IV, PT, DPT Physical Therapist- [facility]  06/13/2021, 3:11 PM

## 2021-06-13 NOTE — Progress Notes (Signed)
Inpatient Diabetes Program Recommendations  AACE/ADA: New Consensus Statement on Inpatient Glycemic Control (2015)  Target Ranges:  Prepandial:   less than 140 mg/dL      Peak postprandial:   less than 180 mg/dL (1-2 hours)      Critically ill patients:  140 - 180 mg/dL    Latest Reference Range & Units 06/12/21 07:45 06/12/21 11:46 06/12/21 13:50 06/12/21 18:25 06/12/21 21:04 06/12/21 23:48 06/13/21 04:02 06/13/21 08:47  Glucose-Capillary 70 - 99 mg/dL 235 (H) 232 (H) 234 (H) 264 (H) 315 (H) 257 (H) 225 (H) 280 (H)   Admit with: Intractable nausea and vomiting  History: DM, CVA  Home DM Meds: Insulin Pump (Omnipod) with Humalog Insulin        Trulicity 4.5 mg Qweek        Farxiga 10 mg daily        Freestyle Libre 2 CGM  Current Orders: Novolog Resistant Correction Scale/ SSI (0-20 units) Q4 hours         Semglee 10 units bid   Pt receives about 127 units of a basal rate on her pump every 24 hours at home. Pt also on TPN    Endocrinologist: Dr. Dwyane Dee with [facility] Last seen 03/26/2021 Switch to U-200 Humalog in the pump Basal rates and Sensitivity adjusted Pump Settings:  12am- 5.2 units/hr 3am- 4.8 units/hr 9am- 5.4 units/hr 3pm- 6 units/hr Carb Ratio= 1 unit for every 1 gram Carbohydrates Sensitivity= 1 unit for every 30 mg/dl > Target CBG Target CBG 120 When pt switches to U-200 Humalog, she should reduce her settings to the following:  12am- 2.6 units/hr 3am- 2.4 units/hr 9am- 2.7 units/hr 3pm- 3 units/hr AND reduce Sensitivity to 1:50  MD note: Pt received a total of 65 units of Novolog in the last 24 hours.  -   Increase Semglee to 30 units bid  --Will follow patient during hospitalization--  Tama Headings RN, MSN, BC-ADM Inpatient Diabetes Coordinator Team Pager [phone] (8a-5p)

## 2021-06-13 NOTE — Progress Notes (Signed)
PHARMACY - TOTAL PARENTERAL NUTRITION CONSULT NOTE   Indication: Small bowel obstruction  Patient Measurements: Height: 5' 6.5" (168.9 cm) Weight: 116.1 kg (256 lb) IBW/kg (Calculated) : 60.45 TPN AdjBW (KG): 74.4 Body mass index is 40.7 kg/m. Usual Weight:    Assessment: Pt admitted with intractable nausea ad vomiting secondary to SBO,  hx multiple hospitalization for small bowel obstructions  Glucose / Insulin: BG 225-281 SSI resistant q4h,  Glargine 10 units BID (Insulin per last 24 hrs: 47 units SSI, 15 units glargine) -was on insulin pump PTA  Electrolytes: WNL Renal: Scr 1.08 Hepatic: AST 12/ALT 13 Intake / Output; -77.6 TG: 107 MIVF: none GI Imaging: GI Surgeries / Procedures: Plan for surgical intervention on Thursday (06/12/21).   Central access: PICC placed 5/23 TPN start date: 5/23  Nutritional Goals: Goal TPN rate is 90 mL/hr (provides 118.8 g of protein and 2224 kcals per day),  fluids 2160 ml/24 hrs  RD Assessment: Estimated Needs Total Energy Estimated Needs: 2150-2350 Total Protein Estimated Needs: 110-125 grams Total Fluid Estimated Needs: > 2 L  Current Nutrition:  NPO  Plan:  - K is on the upper limit of normal and increased from 3.9 to 5.0. Will cut potassium in half in TPN.   -Electrolytes in TPN: Na 52mq/L, K 2435m/L, Ca 35m29mL, Mg 35mE44m, and Phos 135mm8m. Cl:Ac 1:1 -Add standard MVI and trace elements to TPN  -Continue TPN at full rate 90ml/44mcontinue Resistant q4h SSI and insulin glargine 10u bid -Monitor TPN labs for 1st 3 days after initiation then on Mon/Thurs  [employer]2023,7:41 AM

## 2021-06-13 NOTE — Progress Notes (Addendum)
Progress Note   Patient: [name] ASN:[id number] DOB: March 09, 1965 DOA: 06/08/2021     4 DOS: the patient was seen and examined on 06/13/2021   Brief hospital course: [name] is a 56 year old female with history of hypertension, hyperlipidemia, multiple hospitalization for small bowel obstructions, history of incisional hernia repair in 2013, anxiety, depression, asthma, history of idiopathic epilepsy/[place] with last seizure being in 2012, controlled with Topamax, GERD, left eye legally blindness, sleep apnea who does not use CPAP at home, stress incontinence, who presents emergency department for chief concerns of generalized weakness, decreased appetite, nausea, vomiting.   5/22: Patient continued to have abdominal pain with nausea and vomiting.  Compazine was also added with Zofran. Patient was recently seen by general surgery and MR with enterography was ordered and scheduled for 5/28. Worsening leukocytosis. NG tube was placed with significant drainage. General surgery was also consulted for concern of another SBO. She will be most likely going to the OR for exploratory laparotomy as this is her third admission for similar reasons in the past 36-month   5/23: Symptoms are improving with NG tube, significant drainage of above 2 L in the past 24-hour.  Continue to have some diarrhea.  General surgery is planning exploratory laparotomy on 06/12/2021. PICC line to be placed today followed by starting TPN. Renal functions improving, creatinine at 1.05 today. Leukocytosis resolved, all cell lines decreased so there must be some dilutional effect. C. difficile and GI pathogen panel negative.   Assessment and Plan: Nausea and vomiting SBO N/v improved with NG tube placement.  NG tube output persists. Several hospitalizations for this. Ex lab 5/25 did not find adhesion or other source of mechanical SBO. Creeping fat at terminal ileum is suspicious for crohn's disease. -  maintain ng tube, mgmt per surgery - continue tpn - GI consulted for IBD w/u - PT consult  Hyperkalemia Resolved with IV fluid and discontinuation of spironolactone. -Continue to monitor  Seizure disorder ([place] - continue home Keppra 500 mg daily, 750 mg nightly; gabapentin 600 mg twice daily, 1200 mg nightly - Fall precautions  AKI (acute kidney injury) ([place] Resolved. Presumed secondary to prerenal in setting of poor p.o. intake in setting of nausea and vomiting and diarrhea  OSA (obstructive sleep apnea) - CPAP nightly ordered  Essential hypertension, benign Blood pressure wnl -Continue home dose of carvedilol and hydralazine. -Hold spironolactone due to hyperkalemia -Holding home Benicar for AKI-can be resumed if needed  GERD without esophagitis - Protonix 40 mg IV daily  Type 2 diabetes mellitus with chronic kidney disease, with long-term current use of insulin (HCC) CBG elevated. Patient's insulin dosing is reported as insulin lispro, inject 160 units daily in divided doses as directed, Trulicity weekly injection -  SSI q4 - increase semglee to 15 bid  Obesity, Class III, BMI 40-49.9 (morbid obesity) ([place] Estimated body mass index is 40.7 kg/m as calculated from the following:   Height as of this encounter: 5' 6.5" (1.689 m).   Weight as of this encounter: 116.1 kg. This will complicate overall prognosis.  CAD (coronary artery disease) - Rosuvastatin 10 mg nightly, spironolactone 25 mg daily, carvedilol 25 mg p.o. twice daily have been resumed - gen surg ok w/ resuming plavix tomorrow, will order  Major depressive disorder, recurrent episode, moderate (HCC) - cont home duloxetine 60 mg daily, amitriptyline 70 to 75 mg nightly  Diarrhea Resolved. C diff and gi pathogen panel neg  Dyslipidemia - Ezetimibe 10 mg daily, rosuvastatin 10  mg nightly resumed    Subjective: Patient was feeling little improved today.  Denies any more nausea, vomiting or pain. Mild  abd discomfort after surgery yesterday  Physical Exam: Vitals:   06/13/21 0423 06/13/21 0423 06/13/21 0745 06/13/21 0851  BP:  134/73  134/75  Pulse:  79  82  Resp: 17   16  Temp: 98 F (36.7 C)   98.2 F (36.8 C)  TempSrc: Oral     SpO2:  99% 98% 98%  Weight:      Height:       General.  Morbidly obese lady, in no acute distress. Pulmonary.  Lungs clear bilaterally, normal respiratory effort. CV.  Regular rate and rhythm, no JVD, rub or murmur. Abdomen.  Soft, mild tenderness, mild distention, BS positive. Midline incision dressed. C/d/i CNS.  Alert and oriented .  No focal neurologic deficit. Extremities.  No edema, no cyanosis, pulses intact and symmetrical. Psychiatry.  Judgment and insight appears normal.  Data Reviewed: Prior notes, labs and images reviewed.  Family Communication: Discussed with patient  Disposition: Status is: Inpatient Remains inpatient appropriate because: Severity of illness   Planned Discharge Destination: Home  DVT prophylaxis.  Lovenox Time spent: 30 minutes   Author: Desma Maxim, MD 06/13/2021 12:36 PM  For on call review [email or web address].

## 2021-06-13 NOTE — Progress Notes (Signed)
Mobility Specialist - Progress Note     06/13/21 1100  Mobility  Activity Ambulated with assistance in room;Stood at bedside;Dangled on edge of bed  Level of Assistance Minimal assist, patient does 75% or more  Assistive Device Front wheel walker  Distance Ambulated (ft) 20 ft  Activity Response Tolerated well  $Mobility charge 1 Mobility    Pt long sitting upon arrival using RA. Completes bed mobility MinA and STS ModI from elevated bed height. Pt ambulates in room ModI and returns to bed with ModI for bed mobility to lay supine. Pt is left with bed alarm set and needs in reach.  Merrily Brittle Mobility Specialist 06/13/21, 12:02 PM

## 2021-06-13 NOTE — Consult Note (Addendum)
GI Inpatient Consult Note  Reason for Consult: Concern for inflammatory bowel disease.   Attending Requesting Consult: Dr. Ailene Rud [facility]   History of Present Illness: [name] is a 56 y.o. female seen for evaluation of possible Crohn's IBD at the request of Dr. Si Raider. Patient has a PMH of T2DM WITH DIABETIC NEUROPATHY, RETINOPATHY,  HTN, HLD, CAD, CVA, epilepsy last seizure 2012 controlled with Topamax, OSA, Obesity, COPD, Depression, GERD, multiple small bowel obstructions, history of incisional hernia repair (2013).  Patient has been hospitalized 04/22/21, 05/18/2021, and 06/08/2021 for SBO/ileus.  Patient presented to the [facility] ED on 06/08/21 for chief complaint of generalized weakness, decreased appetite, nausea and vomiting. Upon presentation to the ED vital signs were the 98.5, 121, 147/105, respiration 33, SPO2 100% on room air. Labs were significant for WBC 12.3, hemoglobin 15.1, platelets 385, BUN 22, creatinine 1.64, nonfasting GI glucose 117, GFR 37. Stool studies negative for C diff. Imaging studies revealed CT of the abdomen and pelvis with contrast with diffusely dilated fluid-filled stomach proximal small bowel with gradual transition to more normal caliber terminal ileum.  Fluid-filled proximal colon.  She had abdominal pain with nausea vomiting and NG tube was placed with significant drainage.  General surgery was consulted with concern for another SBO.  TPN was placed for nutrition support.T he patient underwent exploratory laparoscopy and lysis of adhesions yesterday for several recent admissions for ileus/SBO.  The general surgeon did not see mechanical obstruction but did see small bowel creeping fat at the terminal ileum concerning for Crohn's disease.  Change in bowel habits/Constipation: The patient reports normal bowel habits until this year. She first developed constipation to the point where she had to perform digital disimpaction.  She started regular doses of   MiraLAX- one capful in a large water bottle and she takes a drink daily.   She will have spells of diarrhea described as passage of multiple small loose or soft stools. Some urgency and rare incontinence.  She has had more diarrhea now than constipation.  Patient has loose stools and soft stools x2-3 per day.  She will have nocturnal awakening with loose stools 3-4 times a night and this happens maybe twice a week.  She has noted no blood or melena.  She has had periods of generalized abdominal pain and hard abdomen with one episode of vomiting prior to this admission. No consistent early satiety.She has been a diabetic for many years and is on an insulin pump. She is easy to get hypoglycemia.  Chart review shows prior CT A/P in 2020 for constipation and abd pain with moderate stool burden.  Hx of GERD x many years and does not take  PPI as outpt. Her heartburn is frequent and uncontrolled prior to admission. Her daily heartburn is gone on IV Protonix and she wishes to remain on a PPI at discharge. No dysphagia.   She underwent exploratory laparotomy yesterday with lysis of adhesions.  Intraoperatively, she was found to have creeping fat in the terminal ileum concerning for possible IBD. Today, she reports that she is doing pretty well.  She is tolerating the NG tube and output is to be expected. Her Hgb is stable.  She is receiving TPN for nutrition since Mon. Albumin 3.0. Glucose 281.  Patient reports incisional pain is manageable.  She has been passing flatus, no bowel movement. No N/v. No fever/chills/ No wt loss. No skin rashes,or mouth ulcers, or eye pain.   Last Colonoscopy: none Last  Endoscopy: none FH: Neg for IBD. Negative for colon cancer, colon polyps. Negative for Inflammatory diseases. Uncle with pancreatic cancer.   Past Medical History:  Past Medical History:  Diagnosis Date   Anxiety and depression 09/13/2006   Qualifier: Diagnosis of  By: Hassell Done [name], [place]     Arthritis    joint  pain    Asthma    COPD (chronic obstructive pulmonary disease) ([place])    chronic bronchitis    Depression    Diabetes mellitus    since age 89; type 2 IDDM   Diabetic neuropathy, painful ([place])    FEET AND HANDS   Diabetic retinopathy ([place])    Diastolic dysfunction    a.  Echo 11/17: EF 60-65%, mild LVH, no RWMA, Gr1DD, mild AI, dilated aortic root measuring 38 mm, mildly dilated ascending aorta   Dilated aortic root (HCC)    20m by echo 11/2015   Dizziness    secondary to diabetes and hypertension    Epilepsy idiopathic petit mal ([place]    last seizure 2012;controlled w/ topomax   GERD (gastroesophageal reflux disease)    Glaucoma    NOT ON ANY EYE DROPS    Headache(784.0)    migraines    Heart murmur    born with    History of stress test    a. 11/17: Normal perfusion, EF 53%, normal study   Hx of blood clots    hematomas removed from left side of brain from [id number]    Hyperlipidemia    Hypertension    Legally blind    left eye    MIGRAINE HEADACHE 09/14/2006   Qualifier: Diagnosis of  By: MarHassell DoneP, Nykedtra     [facility]  a.  Patient reported history of without objective documentation   Nephrolithiasis    frequent urination , urination at nite  PT SEEN IN ER 09/22/11 FOR BACK AND RT SIDED PAIN--HAS KNOWN STONE RT URETER AND UA IN ER SHOWED UTI   Pain 09/23/11   LOWER BACK AND RIGHT SIDE--PT HAS RIGHT URETERAL STONE   Pneumonia    hx of 2009   Pyelonephritis    Seizures ([place]  Sleep apnea    [facility] ; mild   Stress incontinence    Stroke ([place]CC[place]  last 2003  RESIDUAL LEFT LEG WEAKNESS--NO OTHER RESIDUAL PROBLEMS    Problem List: Patient Active Problem List   Diagnosis Date Noted   Diarrhea 06/09/2021   Hyperkalemia 06/09/2021   Intractable nausea and vomiting 06/08/2021   Small bowel obstruction (HCC[place]4/30/2023   Orthostatic hypotension 04/29/2021   Constipation 04/28/2021   SBO (small  bowel obstruction) (HCC[place]4/05/2021   Hypertensive urgency 04/23/2021   GERD without esophagitis 04/23/2021   AKI (acute kidney injury) ([place]CC[place]len[place]4/05/2021   Dyslipidemia 04/23/2021   Chronic diastolic CHF (congestive heart failure) (HCCNorth [place]4/05/2021   Cervical spondylosis 02/12/2021   Lumbar facet arthropathy 02/12/2021   Major depressive disorder, recurrent episode, moderate (HCC[place]0/26/2022   Adrenal adenoma, right    PAD (peripheral artery disease) (HCC[place]1/05/2020   Abnormal ankle brachial index (ABI) 12/18/2019   CAD (coronary artery disease) 04/27/2019   Diabetic polyneuropathy associated with type 2 diabetes mellitus (HCCCla[place]2/12/2018   Vitamin D deficiency 01/24/2018   Overactive bladder 05/18/2017   Dilated aortic root (HCC)    Sacroiliac dysfunction 10/09/2014   Chronic low back pain 09/03/2014   CVA, old,  hemiparesis ([place]) 09/03/2014   OSA (obstructive sleep apnea) 05/15/2014   Type 2 diabetes mellitus with chronic kidney disease, with long-term current use of insulin ([place]) 01/21/2014   Essential hypertension, benign 01/21/2014   Chronic pain syndrome 01/21/2014   Headache disorder 09/07/2013   Mixed urge and stress incontinence 08/25/2011   Ureteral stricture, right 08/25/2011   Nephrolithiasis 08/22/2011   Incisional hernia 05/10/2011   Obesity, Class III, BMI 40-49.9 (morbid obesity) ([place]) 05/10/2011   BLINDNESS, LEGAL, [place] DEFINITION 09/14/2006   Seizure ([place]) 09/13/2006    Past Surgical History: Past Surgical History:  Procedure Laterality Date   BRAIN HEMATOMA EVACUATION     five procedures total, first procedure when 70 months old, last at 56 years of age.   CYSTOSCOPY W/ RETROGRADES  09/24/2011   Procedure: CYSTOSCOPY WITH RETROGRADE PYELOGRAM;  Surgeon: Malka So, MD;  Location: [facility];  Service: Urology;  Laterality: Right;   CYSTOSCOPY WITH URETEROSCOPY  09/24/2011   Procedure: CYSTOSCOPY WITH URETEROSCOPY;  Surgeon: Malka So, MD;  Location: [facility];   Service: Urology;  Laterality: Right;  Balloon dilation right ureter    CYSTOSCOPY/RETROGRADE/URETEROSCOPY  08/24/2011   Procedure: CYSTOSCOPY/RETROGRADE/URETEROSCOPY;  Surgeon: Molli Hazard, MD;  Location: [facility];  Service: Urology;  Laterality: Right;  Cysto, Right retrograde Pyelogram, right stent placement.    INCISIONAL HERNIA REPAIR  05/05/2011   Procedure: LAPAROSCOPIC INCISIONAL HERNIA;  Surgeon: Rolm Bookbinder, MD;  Location: [facility];  Service: General;  Laterality: N/A;   kidney stone removal     LAPAROTOMY N/A 06/12/2021   Procedure: EXPLORATORY LAPAROTOMY, LYSIS OF ADHESIONS;  Surgeon: Olean Ree, MD;  Location: [facility];  Service: General;  Laterality: N/A;   MASS EXCISION  05/05/2011   Procedure: EXCISION MASS;  Surgeon: Rolm Bookbinder, MD;  Location: [place];  Service: General;  Laterality: Right;   PCNL     RETINAL DETACHMENT SURGERY Left 1990   RIGHT/LEFT HEART CATH AND CORONARY ANGIOGRAPHY N/A 04/28/2019   Procedure: RIGHT/LEFT HEART CATH AND CORONARY ANGIOGRAPHY;  Surgeon: Yolonda Kida, MD;  Location: [place] [facility];  Service: Cardiovascular;  Laterality: N/A;   SHUNT REMOVAL     shunt inserted at age 21 removed at age 35    Trooper    Allergies: Allergies  Allergen Reactions   Codeine Swelling and Other (See Comments)    Swelling and burning of mouth (inside)   Lactose Intolerance (Gi) Nausea And Vomiting    Home Medications: Medications Prior to Admission  Medication Sig Dispense Refill Last Dose   amitriptyline (ELAVIL) 25 MG tablet Take 2-3 tablets (50-75 mg total) by mouth at bedtime. 90 tablet 5 06/07/2021   bisacodyl (DULCOLAX) 5 MG EC tablet Take 1 tablet (5 mg total) by mouth daily. 30 tablet 0 06/08/2021   budesonide (PULMICORT) 0.5 MG/2ML nebulizer solution Inhale 0.5 mg into the lungs 2 (two) times daily.   06/08/2021   carvedilol (COREG) 25 MG tablet Take 25 mg by mouth 2 (two) times daily.   06/08/2021    cholecalciferol (VITAMIN D3) 25 MCG (1000 UNIT) tablet Take 1,000 Units by mouth daily.   06/08/2021   ciclopirox (PENLAC) 8 % solution Apply topically at bedtime.   06/07/2021   clopidogrel (PLAVIX) 75 MG tablet Take 1 tablet (75 mg total) by mouth daily. 90 tablet 0 06/08/2021   Continuous Blood Gluc Sensor (FREESTYLE LIBRE 2 SENSOR) MISC Change every 14 days 2 each 3 Past Week   cycloSPORINE (RESTASIS) 0.05 %  ophthalmic emulsion Place 2 drops into both eyes 2 (two) times daily.   06/08/2021   Dulaglutide (TRULICITY) 4.5 DJ/4.9FW SOPN Inject 4.5 mg as directed once a week. 2 mL 3 Past Week   DULoxetine (CYMBALTA) 60 MG capsule Take 1 capsule (60 mg total) by mouth daily. 90 capsule 1 06/08/2021   ezetimibe (ZETIA) 10 MG tablet Take 1 tablet (10 mg total) by mouth daily. 90 tablet 0 06/08/2021   fluticasone (FLONASE) 50 MCG/ACT nasal spray Place 2 sprays into both nostrils daily. 16 g 3 06/08/2021   gabapentin (NEURONTIN) 600 MG tablet 600 mg qAM, 600 mg qPM, 1200 mg QHS 120 tablet 5 06/08/2021   hydrALAZINE (APRESOLINE) 50 MG tablet Take 1 tablet by mouth 3 (three) times daily.   06/08/2021   Insulin Disposable Pump (OMNIPOD DASH PODS, GEN 4,) MISC Change pod once daily 30 each 2 06/08/2021   insulin lispro (HUMALOG KWIKPEN) 200 UNIT/ML KwikPen Inject up to 160 units daily in divided doses as directed 6 mL 2 06/08/2021   Insulin Pen Needle (PEN NEEDLES) 32G X 4 MM MISC Use with Humalog to fill pump daily 100 each 2 06/08/2021   levETIRAcetam (KEPPRA) 500 MG tablet Take 500-750 mg by mouth See admin instructions. Take 1 tablet (547m) by mouth every morning and take 1 tablets (7592m by mouth every night   06/08/2021   mirabegron ER (MYRBETRIQ) 50 MG TB24 tablet Take 1 tablet (50 mg total) by mouth daily. 90 tablet 1 06/08/2021   olmesartan-hydrochlorothiazide (BENICAR HCT) 40-25 MG tablet TAKE 1 TABLET BY MOUTH EVERY DAY 90 tablet 1 06/08/2021   pantoprazole (PROTONIX) 20 MG tablet Take 1 tablet (20 mg total)  by mouth daily. 90 tablet 1 06/08/2021   potassium chloride SA (KLOR-CON M20) 20 MEQ tablet You have not needed potassium while in the hospital.  Hold until outpatient followup. (Patient taking differently: Take 20 mEq by mouth 2 (two) times daily.) 60 tablet 5 06/08/2021   rosuvastatin (CRESTOR) 10 MG tablet Take 1 tablet (10 mg total) by mouth daily. 90 tablet 1 06/08/2021   senna-docusate (SENOKOT-S) 8.6-50 MG tablet Take 1 tablet by mouth 2 (two) times daily. 60 tablet 0 06/08/2021   spironolactone (ALDACTONE) 25 MG tablet Take 25 mg by mouth daily.   06/08/2021   WIXELA INHUB 250-50 MCG/DOSE AEPB Inhale 1 puff into the lungs 2 (two) times daily.   06/08/2021   albuterol (ACCUNEB) 1.25 MG/3ML nebulizer solution Inhale 1 ampule into the lungs every 6 (six) hours as needed for wheezing or shortness of breath.   prn at prn   albuterol (PROAIR HFA) 108 (90 Base) MCG/ACT inhaler INHALE 2 PUFFS BY MOUTH EVERY 6 HOURS AS NEEDED FOR WHEEZING OR SHORTNESS OF BREATH 3 Inhaler 3 prn at prn   baclofen (LIORESAL) 10 MG tablet Take 1 tablet (10 mg total) by mouth 3 (three) times daily as needed for muscle spasms. 270 tablet 1 prn at prn   butalbital-acetaminophen-caffeine (FIORICET) 50-325-40 MG tablet Take 1-2 tablets by mouth every 6 (six) hours as needed for headache. 30 tablet 5 prn at prn   EPINEPHrine 0.3 mg/0.3 mL IJ SOAJ injection Inject 0.3 mg into the muscle as needed for anaphylaxis.   prn at prn   meclizine (ANTIVERT) 25 MG tablet Take 1 tablet (25 mg total) by mouth 2 (two) times daily as needed for dizziness. 30 tablet 0 prn at prn   nitroGLYCERIN (NITROSTAT) 0.4 MG SL tablet Place 0.4 mg under the tongue every 5 (  five) minutes as needed for chest pain.   prn at prn   ondansetron (ZOFRAN-ODT) 8 MG disintegrating tablet Take 1 tablet (8 mg total) by mouth every 8 (eight) hours as needed for nausea or vomiting. 20 tablet 0 prn at prn   polyethylene glycol (MIRALAX / GLYCOLAX) 17 g packet Take 17 g by mouth  daily as needed for mild constipation. 100 each 3 prn at prn   sodium phosphate (FLEET) 7-19 GM/118ML ENEM Place 133 mLs (1 enema total) rectally daily as needed for severe constipation. 133 mL 2 prn at prn   Home medication reconciliation was completed with the patient.   Scheduled Inpatient Medications:    amitriptyline  50-75 mg Oral QHS   budesonide  0.5 mg Inhalation BID   carvedilol  25 mg Oral BID   chlorhexidine  15 mL Mouth Rinse BID   Chlorhexidine Gluconate Cloth  6 each Topical Daily   cholecalciferol  1,000 Units Oral Daily   DULoxetine  60 mg Oral Daily   enoxaparin (LOVENOX) injection  0.5 mg/kg Subcutaneous Q24H   ezetimibe  10 mg Oral Daily   gabapentin  600 mg Oral BID   And   gabapentin  1,200 mg Oral QHS   hydrALAZINE  50 mg Oral TID   insulin aspart  0-20 Units Subcutaneous Q4H   insulin glargine-yfgn  10 Units Subcutaneous BID   ketorolac  30 mg Intravenous Q6H   levETIRAcetam  500 mg Oral Daily   And   levETIRAcetam  750 mg Oral QHS   mouth rinse  15 mL Mouth Rinse q12n4p   mirabegron ER  50 mg Oral Daily   pantoprazole (PROTONIX) IV  40 mg Intravenous Q24H   rosuvastatin  10 mg Oral QHS   sodium chloride flush  10-40 mL Intracatheter Q12H    Continuous Inpatient Infusions:    TPN ADULT (ION) 90 mL/hr at 06/13/21 8546   TPN ADULT (ION)      PRN Inpatient Medications:  albuterol, baclofen, butalbital-acetaminophen-caffeine, dextrose, HYDROmorphone (DILAUDID) injection, LORazepam, meclizine, ondansetron **OR** ondansetron (ZOFRAN) IV, polyethylene glycol, prochlorperazine, sodium chloride flush, sodium phosphate  Family History: family history includes ADD / ADHD in her maternal grandfather; Birth defects in her daughter and maternal grandfather; Brain cancer in her maternal grandmother; Cancer in her mother; Cirrhosis in her maternal grandfather; Diabetes in her maternal grandfather; Hypertension in her daughter, maternal grandmother, and mother;  Uterine cancer in her mother.  The patient's family history is negative for inflammatory bowel disorders, GI malignancy, or solid organ transplantation.  Social History:   reports that she quit smoking about 34 years ago. Her smoking use included cigarettes. She has a 15.00 pack-year smoking history. She has never used smokeless tobacco. She reports current alcohol use of about 1.0 standard drink per week. She reports that she does not use drugs. The patient denies ETOH, tobacco, or drug use.   Review of Systems: Constitutional: No fever/chills.  Eyes: No changes in vision. ENT: No oral lesions, sore throat.  GI: see HPI.  Heme/Lymph: No easy bruising.  CV: No chest pain.  GU: No hematuria.  Integumentary: No rashes.  Neuro: No headaches.  Psych: No depression/anxiety.  Endocrine: No heat/cold intolerance.  Allergic/Immunologic: No urticaria.  Resp: No cough, SOB.  Musculoskeletal: No joint swelling, having intermittent left hand pain since before April     Physical Examination: BP 134/73 (BP Location: Left Arm)   Pulse 79   Temp 98 F (36.7 C) (Oral)   Resp  17   Ht 5' 6.5" (1.689 m)   Wt 116.1 kg   LMP  (LMP Unknown)   SpO2 98%   BMI 40.70 kg/m  Gen: NAD, alert and oriented x 4 HEENT: EOMI, Neck: supple, no JVD or thyromegaly Chest: CTA bilaterally, no wheezes, crackles, or other adventitious sounds CV: RRR, no m/g/c/r Abd: soft, expected post op tenderness, ND, Midline surgery incision well approximated, with dressing dry and intact, Abdomen binder in place, a few BS noted LLQ, reports passing flatus. No guarding, rigidity, or rebound tenderness. NGT intact and at low intermittent suction,  TPN infusing 90 mL/hr.  Ext: no edema Skin: no rash or lesions noted Lymph: no LAD  Data: Lab Results  Component Value Date   WBC 10.0 06/13/2021   HGB 10.1 (L) 06/13/2021   HCT 33.0 (L) 06/13/2021   MCV 93.2 06/13/2021   PLT 242 06/13/2021   Recent Labs  Lab 06/10/21 0413  06/12/21 0412 06/13/21 0420  HGB 10.1* 9.5* 10.1*   Lab Results  Component Value Date   NA 137 06/13/2021   K 5.0 06/13/2021   CL 108 06/13/2021   CO2 24 06/13/2021   BUN 26 (H) 06/13/2021   CREATININE 1.08 (H) 06/13/2021   Lab Results  Component Value Date   ALT 13 06/12/2021   AST 12 (L) 06/12/2021   ALKPHOS 50 06/12/2021   BILITOT 0.2 (L) 06/12/2021   No results for input(s): APTT, INR, PTT in the last 168 hours. Assessment/Plan: Ms. Jaroszewski is a 56 y.o. female with history of T2DM WITH DIABETIC NEUROPATHY, RETINOPATHY,  HTN, HLD, CAD, CVA, epilepsy last seizure 2012 controlled with Topamax, OSA, Obesity, COPD, Depression, GERD, history of incisional hernia repair (2013), hysterectomy.   # Recurrent ileus: She underwent exploratory laparotomy yesterday with lysis of adhesions.  Intraoperatively, she was found to have creeping fat in the terminal ileum concerning for possible IBD.  Previous CT Abd/pelvis reviewed dating back to 2013. CTE reviewed 08/2020. DDX for recurrent ileus/ ?pSBO to include functional, secondary to constipation, dysmotility from diabetes, consider gastroparesis, no evidence of intestinal obstruction, history of previous gastric surgeries to consider occasions which were treated, and IBD. Her H&P does not support classic IBD diagnosis.    Today, she reports that she is doing pretty well.  She is tolerating the NG tube and output is to be expected. Her Hgb is stable.  She is receiving TPN for nutrition since Mon. Albumin 3.0. Glucose 281.  Patient reports incisional pain is manageable.  She has been passing flatus, no bowel movement. No N/v. No fever/chills/ No wt loss. No skin rashes, or mouth ulcers, or eye pain.   # Chronic constipation:  Inadequate doses of Miralax. Past imaging has shown moderate stool burden with abd pain and N/V. Risk for motility issues with long standing DM. Some early satiety.  .  # GERD- chronic without dysphagia  # Anemia: Baseline  Hgb 12-13 range   Recommendations: Begin will begin work-up for Crohn's disease with fecal calprotectin, sed rate and CRP although this may be artificially elevated secondary to recent surgery.  Add iron panel and ferritin to characterize newer onset of anemia - Colonoscopy with biopsies as an outpatient - Diagnostic EGD as outpatient  to evaluate for reflux esophagitis, Barrett's esophagus, gastritis. - Continue pantoprazole 40 mg BID as outpatient.    Thank you for the consult. Please call with questions or concerns.  Denice Paradise, [facility] [phone]

## 2021-06-13 NOTE — Progress Notes (Signed)
06/13/2021  Subjective: Patient is 1 Day Post-Op s/p exploratory laparotomy with lysis of adhesions. Intraoperatively she was found to have creeping fat in the terminal ileum, concerning for possible IBD.  Today, patient reports incisional pain.  Denies any flatus or nausea.  NG tube in place, with gastric contents in canister, about 750 output total.  WBC stable at 10, Hgb stable at 10.1.  Cr mildly elevated but seems around her baseline looking at other labs.    Vital signs: Temp:  [97 F (36.1 C)-98.4 F (36.9 C)] 98.2 F (36.8 C) (05/26 0851) Pulse Rate:  [75-87] 82 (05/26 0851) Resp:  [16-25] 16 (05/26 0851) BP: (107-169)/(49-80) 134/75 (05/26 0851) SpO2:  [91 %-100 %] 98 % (05/26 0851)   Intake/Output: 05/25 0701 - 05/26 0700 In: 2472.4 [I.V.:2122.4; IV Piggyback:350] Out: 6283 [Urine:1750; Emesis/NG output:750; Blood:50] Last BM Date : 06/09/21  Physical Exam: Constitutional:  No acute distress. Abdomen:  soft, obese, non-distended, appropriately tender to palpation.  Midline incision is clean, dry, intact, with only some older blood stain on dressing.  NG tube is in place, with gastric contents.  Labs:  Recent Labs    06/12/21 0412 06/13/21 0420  WBC 6.9 10.0  HGB 9.5* 10.1*  HCT 31.0* 33.0*  PLT 222 242   Recent Labs    06/12/21 0412 06/13/21 0655  NA 139 137  K 3.9 5.0  CL 107 108  CO2 25 24  GLUCOSE 237* 281*  BUN 15 26*  CREATININE 0.91 1.08*  CALCIUM 8.4* 8.5*   No results for input(s): LABPROT, INR in the last 72 hours.  Imaging: No results found.  Assessment/Plan: This is a 56 y.o. female s/p exploratory laparotomy with lysis of adhesions.  --Discussed with the patient the findings in the OR and showed her the picture taken intra-operatively which shows the creeping fat in the terminal ileum.  There were no significant adhesions with the small bowel and no area where there could have been an obstruction.   --Discussed yesterday with Dr. Si Raider.   Recommend GI consult for evaluation of possible IBD as the etiology of the patient's abdominal pain and ongoing issues. --For now, awaiting return of bowel function.  Continue NPO, NG tube to suction, TPN for nutrition. --Continue abdominal binder to help with pain and tension on the sutures. --D/C foley catheter   Melvyn Neth, [facility]

## 2021-06-13 NOTE — Plan of Care (Signed)
  Problem: Clinical Measurements: Goal: Ability to maintain clinical measurements within normal limits will improve Outcome: Progressing Goal: Cardiovascular complication will be avoided Outcome: Progressing   Problem: Activity: Goal: Risk for activity intolerance will decrease Outcome: Progressing   Problem: Coping: Goal: Level of anxiety will decrease Outcome: Progressing   Problem: Safety: Goal: Ability to remain free from injury will improve Outcome: Progressing

## 2021-06-14 DIAGNOSIS — R112 Nausea with vomiting, unspecified: Secondary | ICD-10-CM | POA: Diagnosis not present

## 2021-06-14 LAB — BASIC METABOLIC PANEL
Anion gap: 6 (ref 5–15)
BUN: 28 mg/dL — ABNORMAL HIGH (ref 6–20)
CO2: 25 mmol/L (ref 22–32)
Calcium: 8.6 mg/dL — ABNORMAL LOW (ref 8.9–10.3)
Chloride: 107 mmol/L (ref 98–111)
Creatinine, Ser: 0.97 mg/dL (ref 0.44–1.00)
GFR, Estimated: >60 mL/min (ref >60)
Glucose, Bld: 280 mg/dL — ABNORMAL HIGH (ref 70–99)
Potassium: 4.3 mmol/L (ref 3.5–5.1)
Sodium: 138 mmol/L (ref 135–145)

## 2021-06-14 LAB — MAGNESIUM: Magnesium: 2.2 mg/dL (ref 1.7–2.4)

## 2021-06-14 LAB — CBC
HCT: 31.6 % — ABNORMAL LOW (ref 36.0–46.0)
Hemoglobin: 9.8 g/dL — ABNORMAL LOW (ref 12.0–15.0)
MCH: 29.1 pg (ref 26.0–34.0)
MCHC: 31 g/dL (ref 30.0–36.0)
MCV: 93.8 fL (ref 80.0–100.0)
Platelets: 237 10*3/uL (ref 150–400)
RBC: 3.37 MIL/uL — ABNORMAL LOW (ref 3.87–5.11)
RDW: 14.6 % (ref 11.5–15.5)
WBC: 10.5 10*3/uL (ref 4.0–10.5)
nRBC: 0 % (ref 0.0–0.2)

## 2021-06-14 LAB — GLUCOSE, CAPILLARY
Glucose-Capillary: 248 mg/dL — ABNORMAL HIGH (ref 70–99)
Glucose-Capillary: 293 mg/dL — ABNORMAL HIGH (ref 70–99)
Glucose-Capillary: 303 mg/dL — ABNORMAL HIGH (ref 70–99)
Glucose-Capillary: 305 mg/dL — ABNORMAL HIGH (ref 70–99)
Glucose-Capillary: 272 mg/dL — ABNORMAL HIGH (ref 70–99)

## 2021-06-14 LAB — FERRITIN: Ferritin: 106 ng/mL (ref 11–307)

## 2021-06-14 LAB — C-REACTIVE PROTEIN: CRP: 16.7 mg/dL — ABNORMAL HIGH (ref <1.0)

## 2021-06-14 LAB — IRON AND TIBC
Iron: 12 ug/dL — ABNORMAL LOW (ref 28–170)
Saturation Ratios: 6 % — ABNORMAL LOW (ref 10.4–31.8)
TIBC: 214 ug/dL — ABNORMAL LOW (ref 250–450)
UIBC: 202 ug/dL

## 2021-06-14 LAB — SEDIMENTATION RATE: Sed Rate: 65 mm/hr — ABNORMAL HIGH (ref 0–30)

## 2021-06-14 MED ORDER — TRAVASOL 10 % IV SOLN
INTRAVENOUS | Status: AC
  Filled 2021-06-14: qty 1188

## 2021-06-14 MED ORDER — HYDROCHLOROTHIAZIDE 25 MG PO TABS
25.0000 mg | ORAL_TABLET | Freq: Every day | ORAL | Status: DC
  Administered 2021-06-14 – 2021-06-19 (×6): 25 mg via ORAL
  Filled 2021-06-14 (×6): qty 1

## 2021-06-14 MED ORDER — SORBITOL 70 % SOLN
30.0000 mL | Freq: Every day | Status: DC | PRN
  Administered 2021-06-14: 30 mL via ORAL
  Filled 2021-06-14 (×2): qty 30

## 2021-06-14 MED ORDER — IRBESARTAN 150 MG PO TABS
300.0000 mg | ORAL_TABLET | Freq: Every day | ORAL | Status: DC
Start: 2021-06-14 — End: 2021-06-19
  Administered 2021-06-14 – 2021-06-19 (×6): 300 mg via ORAL
  Filled 2021-06-14 (×6): qty 2

## 2021-06-14 MED ORDER — OLMESARTAN MEDOXOMIL-HCTZ 40-25 MG PO TABS: 1.0000 | ORAL_TABLET | Freq: Every day | ORAL | Status: DC

## 2021-06-14 NOTE — Progress Notes (Signed)
Subjective:  CC: [name] is a 56 y.o. female  Hospital stay day 5, 2 Days Post-Op  s/p exploratory laparotomy with lysis of adhesions. Intraoperatively she was found to have creeping fat in the terminal ileum, concerning for possible IBD.  HPI: No issues overnight.  States she is passing flatus.  Tolerating sips with meds and ice chips.   ROS:  General: Denies weight loss, weight gain, fatigue, fevers, chills, and night sweats. Heart: Denies chest pain, palpitations, racing heart, irregular heartbeat, leg pain or swelling, and decreased activity tolerance. Respiratory: Denies breathing difficulty, shortness of breath, wheezing, cough, and sputum. GI: Denies change in appetite, heartburn, nausea, vomiting, constipation, diarrhea, and blood in stool. GU: Denies difficulty urinating, pain with urinating, urgency, frequency, blood in urine.   Objective:   Temp:  [98.4 F (36.9 C)-98.5 F (36.9 C)] 98.4 F (36.9 C) (05/27 0314) Pulse Rate:  [80-90] 85 (05/27 0314) Resp:  [18-20] 18 (05/27 0314) BP: (137-152)/(75-81) 152/75 (05/27 0314) SpO2:  [96 %-98 %] 97 % (05/27 0804) Weight:  [121.8 kg] 121.8 kg (05/27 0400)     Height: 5' 6.5" (168.9 cm) Weight: 121.8 kg BMI (Calculated): 42.7   Intake/Output this shift:   Intake/Output Summary (Last 24 hours) at 06/14/2021 1025 Last data filed at 06/14/2021 0530 Gross per 24 hour  Intake 2023 ml  Output 1500 ml  Net 523 ml    Constitutional :  alert, cooperative, appears stated age, and no distress  Respiratory:  clear to auscultation bilaterally  Cardiovascular:  regular rate and rhythm  Gastrointestinal: Soft, TTP around incision, non-distended .   Skin: Cool and moist. Staple dressing c/d/i  Psychiatric: Normal affect, non-agitated, not confused       LABS:     Latest Ref Rng & Units 06/14/2021    3:28 AM 06/13/2021    6:55 AM 06/12/2021    4:12 AM  CMP  Glucose 70 - 99 mg/dL 280   281   237    BUN 6 - 20 mg/dL 28    26   15     Creatinine 0.44 - 1.00 mg/dL 0.97   1.08   0.91    Sodium 135 - 145 mmol/L 138   137   139    Potassium 3.5 - 5.1 mmol/L 4.3   5.0   3.9    Chloride 98 - 111 mmol/L 107   108   107    CO2 22 - 32 mmol/L 25   24   25     Calcium 8.9 - 10.3 mg/dL 8.6   8.5   8.4    Total Protein 6.5 - 8.1 g/dL   6.5    Total Bilirubin 0.3 - 1.2 mg/dL   0.2    Alkaline Phos 38 - 126 U/L   50    AST 15 - 41 U/L   12    ALT 0 - 44 U/L   13        Latest Ref Rng & Units 06/14/2021    3:28 AM 06/13/2021    4:20 AM 06/12/2021    4:12 AM  CBC  WBC 4.0 - 10.5 K/uL 10.5   10.0   6.9    Hemoglobin 12.0 - 15.0 g/dL 9.8   10.1   9.5    Hematocrit 36.0 - 46.0 % 31.6   33.0   31.0    Platelets 150 - 400 K/uL 237   242   222      RADS:  N/a Assessment:   S/p  s/p exploratory laparotomy with lysis of adhesions. Intraoperatively she was found to have creeping fat in the terminal ileum, concerning for possible IBD.  Feeling well, passing flatus.  Bilious output in NG but thinner, with ice chip consumption.  Will clamp for the am and check residual to see if any issues.  May consider advancing to clears with NG in later today if residual minimal.  labs/images/medications/previous chart entries reviewed personally and relevant changes/updates noted above.

## 2021-06-14 NOTE — Progress Notes (Signed)
Progress Note   Patient: [name] PYP:[id number] DOB: 1965/11/24 DOA: 06/08/2021     5 DOS: the patient was seen and examined on 06/14/2021   Brief hospital course: [name] is a 56 year old female with history of hypertension, hyperlipidemia, multiple hospitalization for small bowel obstructions, history of incisional hernia repair in 2013, anxiety, depression, asthma, history of idiopathic epilepsy/petit mall with last seizure being in 2012, controlled with Topamax, GERD, left eye legally blindness, sleep apnea who does not use CPAP at home, stress incontinence, who presents emergency department for chief concerns of generalized weakness, decreased appetite, nausea, vomiting.   5/22: Patient continued to have abdominal pain with nausea and vomiting.  Compazine was also added with Zofran. Patient was recently seen by general surgery and MR with enterography was ordered and scheduled for 5/28. Worsening leukocytosis. NG tube was placed with significant drainage. General surgery was also consulted for concern of another SBO. She will be most likely going to the OR for exploratory laparotomy as this is her third admission for similar reasons in the past 72-month   5/23: Symptoms are improving with NG tube, significant drainage of above 2 L in the past 24-hour.  Continue to have some diarrhea.  General surgery is planning exploratory laparotomy on 06/12/2021. PICC line to be placed today followed by starting TPN. Renal functions improving, creatinine at 1.05 today. Leukocytosis resolved, all cell lines decreased so there must be some dilutional effect. C. difficile and GI pathogen panel negative.   Assessment and Plan: Nausea and vomiting SBO N/v improved with NG tube placement.  NG tube output persists. Several hospitalizations for this. Ex lab 5/25 did not find adhesion or other source of mechanical SBO. Creeping fat at terminal ileum is suspicious for crohn's disease. -  maintain ng tube, mgmt per surgery. Clamped currently - continue tpn - GI consulted for IBD w/u. Will need outpt g/u f/u for luminal evaluation - aggressive treatment of constipation - PT consulted, advises HH PT  Hyperkalemia Resolved with IV fluid and discontinuation of spironolactone. -Continue to monitor  Seizure disorder ([place] - continue home Keppra 500 mg daily, 750 mg nightly; gabapentin 600 mg twice daily, 1200 mg nightly - Fall precautions  AKI (acute kidney injury) ([place] Resolved. Presumed secondary to prerenal in setting of poor p.o. intake in setting of nausea and vomiting and diarrhea  OSA (obstructive sleep apnea) - CPAP nightly ordered  Essential hypertension, benign Blood pressure mildly elevated today -Continue home dose of carvedilol and hydralazine. -Hold spironolactone due to hyperkalemia -resume home benicar  GERD without esophagitis - Protonix 40 mg IV daily  Type 2 diabetes mellitus with chronic kidney disease, with long-term current use of insulin (HCC) CBG elevated. Patient's insulin dosing is reported as insulin lispro, inject 160 units daily in divided doses as directed, Trulicity weekly injection -  SSI q4 - increased semglee to 15 bid - pharmacy to add insulin to TPN  Obesity, Class III, BMI 40-49.9 (morbid obesity) ([place] Estimated body mass index is 40.7 kg/m as calculated from the following:   Height as of this encounter: 5' 6.5" (1.689 m).   Weight as of this encounter: 116.1 kg. This will complicate overall prognosis.  CAD (coronary artery disease) - Rosuvastatin 10 mg nightly, spironolactone 25 mg daily, carvedilol 25 mg p.o. twice daily have been resumed - gen surg ok w/ resuming plavix tomorrow, will order  Major depressive disorder, recurrent episode, moderate (HCC) - cont home duloxetine 60 mg daily, amitriptyline 70 to 75  mg nightly  Diarrhea Resolved. C diff and gi pathogen panel neg  Dyslipidemia - Ezetimibe 10 mg daily,  rosuvastatin 10 mg nightly resumed    Subjective: Patient was feeling little improved today.  Denies any more nausea, vomiting or pain. Mild abd discomfort   Physical Exam: Vitals:   06/13/21 2042 06/14/21 0314 06/14/21 0400 06/14/21 0804  BP:  (!) 152/75    Pulse:  85    Resp:  18    Temp:  98.4 F (36.9 C)    TempSrc:      SpO2: 96% 97%  97%  Weight:   121.8 kg   Height:       General.  Morbidly obese lady, in no acute distress. Pulmonary.  Lungs clear bilaterally, normal respiratory effort. CV.  Regular rate and rhythm, no JVD, rub or murmur. Abdomen.  Soft, mild tenderness, mild distention, BS positive. Midline incision dressed. C/d/i CNS.  Alert and oriented .  No focal neurologic deficit. Extremities.  No edema, no cyanosis, pulses intact and symmetrical. Psychiatry.  Judgment and insight appears normal.  Data Reviewed: Prior notes, labs and images reviewed.  Family Communication: Discussed with patient  Disposition: Status is: Inpatient Remains inpatient appropriate because: Severity of illness   Planned Discharge Destination: Home w/ HH PT (PT has evaluated)  DVT prophylaxis.  Lovenox Time spent: 30 minutes   Author: Desma Maxim, MD 06/14/2021 12:54 PM  For on call review [email or web address].

## 2021-06-14 NOTE — Progress Notes (Signed)
PHARMACY - TOTAL PARENTERAL NUTRITION CONSULT NOTE   Indication: Small bowel obstruction  Patient Measurements: Height: 5' 6.5" (168.9 cm) Weight: 121.8 kg (268 lb 8.3 oz) IBW/kg (Calculated) : 60.45 TPN AdjBW (KG): 74.4 Body mass index is 42.69 kg/m. Usual Weight:    Assessment: Pt admitted with intractable nausea ad vomiting secondary to SBO,  hx multiple hospitalization for small bowel obstructions  Glucose / Insulin: BG 225-281 SSI resistant q4h,  Glargine 15 units BID (Insulin per last 24 hrs: 54 units SSI, 15 units glargine) -was on insulin pump PTA  Electrolytes: WNL Renal: Scr<1(at baseline) Hepatic: AST 12/ALT 13 Intake / Output; +352m TG: 107 MIVF: none GI Imaging: GI Surgeries / Procedures:  Exploratory laparatomy with lysis of adhesions 06/12/21.   Central access: PICC placed 5/23 TPN start date: 5/23  Nutritional Goals: Goal TPN rate is 90 mL/hr (provides 118.8 g of protein and 2224 kcals per day),  fluids 2160 ml/24 hrs  RD Assessment: Estimated Needs Total Energy Estimated Needs: 2150-2350 Total Protein Estimated Needs: 110-125 grams Total Fluid Estimated Needs: > 2 L  Current Nutrition:  NPO  Plan:  -Continue TPN at full rate 998mhr -Electrolytes in TPN: Na 5059mL, K 48m77m, Ca 5mEq48m Mg 5mEq/31mand Phos 15mmol34mCl:Ac 1:1 -Add standard MVI and trace elements to TPN -Will add 10 units of insulin regular to TPN bag and continue Resistant q4h SSI and insulin glargine 15u bid -Monitor TPN labs for 1st 3 days after initiation then on Mon/Thurs, as appropriate  [name] A [name] 06/14/2021,8:36 AM

## 2021-06-14 NOTE — TOC Progression Note (Signed)
Transition of Care [facility]) - Progression Note    Patient Details  Name: [name] MRN: [id number] Date of Birth: 1965-12-20  Transition of [facility]) CM/SW Contact  Izola Price, RN Phone Number: 06/14/2021, 4:57 PM  Clinical Narrative:  5/27: Left message for Floydene Flock with [facility] to check on resumption of [facility] services on discharge. PT recommends [place] on DC. NMS today. Simmie Davies RN CM      Expected Discharge Plan: [place] Barriers to Discharge: Continued Medical Work up  Expected Discharge Plan and Services Expected Discharge Plan: [place] Choice: Resumption of Svcs/PTA Provider Living arrangements for the past 2 months: [place]: PT [place]: [place] ([place]) Date [place]: 06/11/21   Representative spoke with at [place]: Floydene Flock   Social Determinants of Health ([place]) Interventions    Readmission Risk Interventions    06/11/2021   11:43 AM 05/19/2021   12:02 PM 04/24/2021    3:02 PM  Readmission Risk Prevention Plan  Transportation Screening Complete Complete Complete  PCP or Specialist Appt within 5-7 Days  Complete   Home Care Screening  Complete   Medication Review (RN CM)  Complete   HRI or Home Care Consult   Complete  Palliative Care Screening   Not Applicable  Medication Review (RN Care Manager) Complete  Complete  PCP or Specialist appointment within 3-5 days of discharge Complete    HRI or [place] Complete    SW Recovery Care/Counseling Consult Complete    [place] Not Applicable

## 2021-06-14 NOTE — Plan of Care (Signed)

## 2021-06-15 ENCOUNTER — Ambulatory Visit: Payer: Medicare Other

## 2021-06-15 ENCOUNTER — Ambulatory Visit
Admission: RE | Admit: 2021-06-15 | Discharge: 2021-06-15 | Disposition: A | Discharge status: Home / Self Care | Payer: Medicare Other | Source: Ambulatory Visit | Attending: Surgery | Admitting: Surgery

## 2021-06-15 DIAGNOSIS — R112 Nausea with vomiting, unspecified: Secondary | ICD-10-CM | POA: Diagnosis not present

## 2021-06-15 LAB — BASIC METABOLIC PANEL
Anion gap: 5 (ref 5–15)
BUN: 25 mg/dL — ABNORMAL HIGH (ref 6–20)
CO2: 27 mmol/L (ref 22–32)
Calcium: 8.6 mg/dL — ABNORMAL LOW (ref 8.9–10.3)
Chloride: 108 mmol/L (ref 98–111)
Creatinine, Ser: 0.85 mg/dL (ref 0.44–1.00)
GFR, Estimated: >60 mL/min (ref >60)
Glucose, Bld: 269 mg/dL — ABNORMAL HIGH (ref 70–99)
Potassium: 4.1 mmol/L (ref 3.5–5.1)
Sodium: 140 mmol/L (ref 135–145)

## 2021-06-15 LAB — GLUCOSE, CAPILLARY
Glucose-Capillary: 249 mg/dL — ABNORMAL HIGH (ref 70–99)
Glucose-Capillary: 259 mg/dL — ABNORMAL HIGH (ref 70–99)
Glucose-Capillary: 265 mg/dL — ABNORMAL HIGH (ref 70–99)
Glucose-Capillary: 280 mg/dL — ABNORMAL HIGH (ref 70–99)
Glucose-Capillary: 281 mg/dL — ABNORMAL HIGH (ref 70–99)

## 2021-06-15 LAB — CBC
HCT: 31.2 % — ABNORMAL LOW (ref 36.0–46.0)
Hemoglobin: 9.7 g/dL — ABNORMAL LOW (ref 12.0–15.0)
MCH: 28.6 pg (ref 26.0–34.0)
MCHC: 31.1 g/dL (ref 30.0–36.0)
MCV: 92 fL (ref 80.0–100.0)
Platelets: 232 10*3/uL (ref 150–400)
RBC: 3.39 MIL/uL — ABNORMAL LOW (ref 3.87–5.11)
RDW: 14.2 % (ref 11.5–15.5)
WBC: 9.3 10*3/uL (ref 4.0–10.5)
nRBC: 0 % (ref 0.0–0.2)

## 2021-06-15 LAB — PHOSPHORUS: Phosphorus: 4 mg/dL (ref 2.5–4.6)

## 2021-06-15 LAB — MAGNESIUM: Magnesium: 2.1 mg/dL (ref 1.7–2.4)

## 2021-06-15 MED ORDER — TRAVASOL 10 % IV SOLN
INTRAVENOUS | Status: AC
  Filled 2021-06-15: qty 1188

## 2021-06-15 MED ORDER — INSULIN GLARGINE-YFGN 100 UNIT/ML ~~LOC~~ SOLN
20.0000 [IU] | Freq: Two times a day (BID) | SUBCUTANEOUS | Status: DC
  Administered 2021-06-15: 20 [IU] via SUBCUTANEOUS
  Filled 2021-06-15 (×2): qty 0.2

## 2021-06-15 NOTE — Progress Notes (Signed)
Subjective:  CC: [name] is a 56 y.o. female  Hospital stay day 6, 3 Days Post-Op  s/p exploratory laparotomy with lysis of adhesions. Intraoperatively she was found to have creeping fat in the terminal ileum, concerning for possible IBD.  HPI: Started to develop pain and nausea with tube clamped overnight.  ROS:  General: Denies weight loss, weight gain, fatigue, fevers, chills, and night sweats. Heart: Denies chest pain, palpitations, racing heart, irregular heartbeat, leg pain or swelling, and decreased activity tolerance. Respiratory: Denies breathing difficulty, shortness of breath, wheezing, cough, and sputum. GI: Denies change in appetite, heartburn, nausea, vomiting, constipation, diarrhea, and blood in stool. GU: Denies difficulty urinating, pain with urinating, urgency, frequency, blood in urine.   Objective:   Temp:  [97.8 F (36.6 C)-98.4 F (36.9 C)] 97.9 F (36.6 C) (05/28 0804) Pulse Rate:  [81-89] 89 (05/28 0804) Resp:  [18-20] 18 (05/28 0804) BP: (163-175)/(88-99) 163/96 (05/28 0804) SpO2:  [97 %-99 %] 98 % (05/28 0832) Weight:  [120 kg] 120 kg (05/28 0500)     Height: 5' 6.5" (168.9 cm) Weight: 120 kg BMI (Calculated): 42.07   Intake/Output this shift:   Intake/Output Summary (Last 24 hours) at 06/15/2021 1035 Last data filed at 06/15/2021 0900 Gross per 24 hour  Intake 2445.95 ml  Output 2200 ml  Net 245.95 ml    Constitutional :  alert, cooperative, appears stated age, and no distress  Respiratory:  clear to auscultation bilaterally  Cardiovascular:  regular rate and rhythm  Gastrointestinal: Soft, TTP around incision, increased distention .   Skin: Cool and moist. Staple dressing c/d/i  Psychiatric: Normal affect, non-agitated, not confused       LABS:     Latest Ref Rng & Units 06/15/2021    5:22 AM 06/14/2021    3:28 AM 06/13/2021    6:55 AM  CMP  Glucose 70 - 99 mg/dL 269   280   281    BUN 6 - 20 mg/dL 25   28   26     Creatinine  0.44 - 1.00 mg/dL 0.85   0.97   1.08    Sodium 135 - 145 mmol/L 140   138   137    Potassium 3.5 - 5.1 mmol/L 4.1   4.3   5.0    Chloride 98 - 111 mmol/L 108   107   108    CO2 22 - 32 mmol/L 27   25   24     Calcium 8.9 - 10.3 mg/dL 8.6   8.6   8.5        Latest Ref Rng & Units 06/15/2021    5:22 AM 06/14/2021    3:28 AM 06/13/2021    4:20 AM  CBC  WBC 4.0 - 10.5 K/uL 9.3   10.5   10.0    Hemoglobin 12.0 - 15.0 g/dL 9.7   9.8   10.1    Hematocrit 36.0 - 46.0 % 31.2   31.6   33.0    Platelets 150 - 400 K/uL 232   237   242      RADS: N/a Assessment:   S/p  s/p exploratory laparotomy with lysis of adhesions. Intraoperatively she was found to have creeping fat in the terminal ileum, concerning for possible IBD.  NG placed back on suction this am, with about 362m of output, relief in discomfort.  Will go back to ice chips, and LIS with NG.  Serial abdominal exams  labs/images/medications/previous chart entries reviewed  personally and relevant changes/updates noted above.

## 2021-06-15 NOTE — Progress Notes (Signed)
PHARMACY - TOTAL PARENTERAL NUTRITION CONSULT NOTE   Indication: Small bowel obstruction  Patient Measurements: Height: 5' 6.5" (168.9 cm) Weight: 120 kg (264 lb 8.8 oz) IBW/kg (Calculated) : 60.45 TPN AdjBW (KG): 74.4 Body mass index is 42.06 kg/m. Usual Weight:    Assessment: Pt admitted with intractable nausea ad vomiting secondary to SBO,  hx multiple hospitalization for small bowel obstructions  Glucose / Insulin: BG 249-305 SSI resistant q4h,  Glargine 15 units BID (Insulin per last 24 hrs: 59 units SSI, 15 units glargine) -was on insulin pump PTA  Electrolytes: WNL Renal: Scr<1(at baseline) Hepatic: AST 12/ALT 13 Intake / Output; +210m TG: 107 MIVF: none GI Imaging: GI Surgeries / Procedures:  Exploratory laparatomy with lysis of adhesions 06/12/21.   Central access: PICC placed 5/23 TPN start date: 5/23  Nutritional Goals: Goal TPN rate is 90 mL/hr (provides 118.8 g of protein and 2224 kcals per day),  fluids 2160 ml/24 hrs  RD Assessment: Estimated Needs Total Energy Estimated Needs: 2150-2350 Total Protein Estimated Needs: 110-125 grams Total Fluid Estimated Needs: > 2 L  Current Nutrition:  NPO  Plan:  -Continue TPN at full rate 981mhr -Electrolytes in TPN: Na 5097mL, K 53m77m, Ca 5mEq51m Mg 5mEq/24mand Phos 15mmol75mCl:Ac 1:1 -Add standard MVI and trace elements to TPN -Will increase insulin in bag to 20 units and continue Resistant q4h SSI and insulin glargine 15u bid -Monitor TPN labs for 1st 3 days after initiation then on Mon/Thurs, as appropriate  [name] A [name] 06/15/2021,9:38 AM

## 2021-06-15 NOTE — Progress Notes (Signed)
Progress Note   Patient: [name] XBL:[id number] DOB: 1965-10-08 DOA: 06/08/2021     6 DOS: the patient was seen and examined on 06/15/2021   Brief hospital course: [name] is a 56 year old female with history of hypertension, hyperlipidemia, multiple hospitalization for small bowel obstructions, history of incisional hernia repair in 2013, anxiety, depression, asthma, history of idiopathic epilepsy/petit mall with last seizure being in 2012, controlled with Topamax, GERD, left eye legally blindness, sleep apnea who does not use CPAP at home, stress incontinence, who presents emergency department for chief concerns of generalized weakness, decreased appetite, nausea, vomiting.   5/22: Patient continued to have abdominal pain with nausea and vomiting.  Compazine was also added with Zofran. Patient was recently seen by general surgery and MR with enterography was ordered and scheduled for 5/28. Worsening leukocytosis. NG tube was placed with significant drainage. General surgery was also consulted for concern of another SBO. She will be most likely going to the OR for exploratory laparotomy as this is her third admission for similar reasons in the past 16-month   5/23: Symptoms are improving with NG tube, significant drainage of above 2 L in the past 24-hour.  Continue to have some diarrhea.  General surgery is planning exploratory laparotomy on 06/12/2021. PICC line to be placed today followed by starting TPN. Renal functions improving, creatinine at 1.05 today. Leukocytosis resolved, all cell lines decreased so there must be some dilutional effect. C. difficile and GI pathogen panel negative.   Assessment and Plan: Nausea and vomiting SBO N/v improved with NG tube placement.  NG tube output persists. Several hospitalizations for this. Ex lab 5/25 did not find adhesion or other source of mechanical SBO. Creeping fat at terminal ileum is suspicious for crohn's disease.  Attempt at clamping ng tube unsuccessful yesterday, placed back on LIS - maintain ng tube, mgmt per surgery.  - continue tpn - GI consulted for IBD w/u. Will need outpt g/u f/u for luminal evaluation - treat constipation - PT consulted, advises HH PT  Hyperkalemia Resolved with IV fluid and discontinuation of spironolactone. -Continue to monitor  Seizure disorder (HCC) - continue home Keppra 500 mg daily, 750 mg nightly; gabapentin 600 mg twice daily, 1200 mg nightly - Fall precautions  AKI (acute kidney injury) ([place] Resolved. Presumed secondary to prerenal in setting of poor p.o. intake in setting of nausea and vomiting and diarrhea  OSA (obstructive sleep apnea) - CPAP nightly ordered  Essential hypertension, benign Blood pressure mildly elevated today -Continue home dose of carvedilol and hydralazine. -Hold spironolactone due to hyperkalemia -resumed home benicar  GERD without esophagitis - Protonix 40 mg IV daily  Type 2 diabetes mellitus with chronic kidney disease, with long-term current use of insulin (HCC) CBG elevated. Patient's insulin dosing is reported as insulin lispro, inject 160 units daily in divided doses as directed, Trulicity weekly injection -  SSI q4 - increase semglee to 20 bid - pharmacy now adding insulin to TPN  Obesity, Class III, BMI 40-49.9 (morbid obesity) ([place] Estimated body mass index is 40.7 kg/m as calculated from the following:   Height as of this encounter: 5' 6.5" (1.689 m).   Weight as of this encounter: 116.1 kg. This will complicate overall prognosis.  CAD (coronary artery disease) - Rosuvastatin 10 mg nightly, carvedilol 25 mg p.o. twice daily have been resumed - plavix resumed  Major depressive disorder, recurrent episode, moderate (HCC) - cont home duloxetine 60 mg daily, amitriptyline 70 to 75 mg nightly  Diarrhea Resolved. C diff and gi pathogen panel neg  Dyslipidemia - Ezetimibe 10 mg daily, rosuvastatin 10 mg  nightly resumed    Subjective: Patient was feeling little improved today.  Denies any more nausea, vomiting or pain. Mild abd discomfort improved with hooking ng tube back to suction  Physical Exam: Vitals:   06/15/21 0420 06/15/21 0500 06/15/21 0804 06/15/21 0832  BP: (!) 164/88  (!) 163/96   Pulse: 85  89   Resp: 20  18   Temp: 98.4 F (36.9 C)  97.9 F (36.6 C)   TempSrc: Oral  Oral   SpO2: 99%  98% 98%  Weight:  120 kg    Height:       General.  Morbidly obese lady, in no acute distress. Pulmonary.  Lungs clear bilaterally, normal respiratory effort. CV.  Regular rate and rhythm, no JVD, rub or murmur. Abdomen.  Soft, no tenderness, mod distention, BS positive. Midline incision dressed. C/d/i CNS.  Alert and oriented .  No focal neurologic deficit. Extremities.  No edema, no cyanosis, pulses intact and symmetrical. Psychiatry.  Judgment and insight appears normal.  Data Reviewed: Prior notes, labs and images reviewed.  Family Communication: Discussed with patient  Disposition: Status is: Inpatient Remains inpatient appropriate because: Severity of illness   Planned Discharge Destination: Home w/ HH PT (PT has evaluated)  DVT prophylaxis.  Lovenox Time spent: 30 minutes   Author: Desma Maxim, MD 06/15/2021 12:53 PM  For on call review [email or web address].

## 2021-06-15 NOTE — TOC Progression Note (Signed)
Transition of Care [facility]) - Progression Note    Patient Details  Name: [name] MRN: [id number] Date of Birth: [birth year]  [facility] of [facility]) CM/SW Contact  Izola Price, RN Phone Number: 06/15/2021, 5:04 PM  Clinical Narrative: 5/28: Per Corene Cornea at [facility], they cannot take patient back unless needing PT only. Update Provider, and PT putting in her for another evaluation in am for [facility] needs. NG will be out so note likely to need RN. Simmie Davies RN CM       Expected Discharge Plan: [place] Barriers to Discharge: Continued Medical Work up  Expected Discharge Plan and Services Expected Discharge Plan: [place] Choice: Resumption of Svcs/PTA Provider Living arrangements for the past 2 months: [place]: PT [place]: [place] ([place]) Date [place]: 06/11/21   Representative spoke with at [place]: Floydene Flock   Social Determinants of Health ([place]) Interventions    Readmission Risk Interventions    06/11/2021   11:43 AM 05/19/2021   12:02 PM 04/24/2021    3:02 PM  Readmission Risk Prevention Plan  Transportation Screening Complete Complete Complete  PCP or Specialist Appt within 5-7 Days  Complete   Home Care Screening  Complete   Medication Review (RN CM)  Complete   HRI or Home Care Consult   Complete  Palliative Care Screening   Not Applicable  Medication Review (RN Care Manager) Complete  Complete  PCP or Specialist appointment within 3-5 days of discharge Complete    HRI or [place] Complete    SW Recovery Care/Counseling Consult Complete    [address] Not Applicable

## 2021-06-16 DIAGNOSIS — R112 Nausea with vomiting, unspecified: Secondary | ICD-10-CM | POA: Diagnosis not present

## 2021-06-16 LAB — COMPREHENSIVE METABOLIC PANEL
ALT: 16 U/L (ref 0–44)
AST: 14 U/L — ABNORMAL LOW (ref 15–41)
Albumin: 3 g/dL — ABNORMAL LOW (ref 3.5–5.0)
Alkaline Phosphatase: 60 U/L (ref 38–126)
Anion gap: 9 (ref 5–15)
BUN: 23 mg/dL — ABNORMAL HIGH (ref 6–20)
CO2: 27 mmol/L (ref 22–32)
Calcium: 8.7 mg/dL — ABNORMAL LOW (ref 8.9–10.3)
Chloride: 103 mmol/L (ref 98–111)
Creatinine, Ser: 0.79 mg/dL (ref 0.44–1.00)
GFR, Estimated: >60 mL/min (ref >60)
Glucose, Bld: 269 mg/dL — ABNORMAL HIGH (ref 70–99)
Potassium: 3.8 mmol/L (ref 3.5–5.1)
Sodium: 139 mmol/L (ref 135–145)
Total Bilirubin: 0.3 mg/dL (ref 0.3–1.2)
Total Protein: 6.8 g/dL (ref 6.5–8.1)

## 2021-06-16 LAB — MAGNESIUM: Magnesium: 2 mg/dL (ref 1.7–2.4)

## 2021-06-16 LAB — CBC
HCT: 31.2 % — ABNORMAL LOW (ref 36.0–46.0)
Hemoglobin: 9.8 g/dL — ABNORMAL LOW (ref 12.0–15.0)
MCH: 28.6 pg (ref 26.0–34.0)
MCHC: 31.4 g/dL (ref 30.0–36.0)
MCV: 91 fL (ref 80.0–100.0)
Platelets: 253 10*3/uL (ref 150–400)
RBC: 3.43 MIL/uL — ABNORMAL LOW (ref 3.87–5.11)
RDW: 13.8 % (ref 11.5–15.5)
WBC: 8.8 10*3/uL (ref 4.0–10.5)
nRBC: 0 % (ref 0.0–0.2)

## 2021-06-16 LAB — GLUCOSE, CAPILLARY
Glucose-Capillary: 264 mg/dL — ABNORMAL HIGH (ref 70–99)
Glucose-Capillary: 240 mg/dL — ABNORMAL HIGH (ref 70–99)
Glucose-Capillary: 299 mg/dL — ABNORMAL HIGH (ref 70–99)
Glucose-Capillary: 304 mg/dL — ABNORMAL HIGH (ref 70–99)
Glucose-Capillary: 316 mg/dL — ABNORMAL HIGH (ref 70–99)
Glucose-Capillary: 291 mg/dL — ABNORMAL HIGH (ref 70–99)

## 2021-06-16 LAB — PHOSPHORUS: Phosphorus: 4.1 mg/dL (ref 2.5–4.6)

## 2021-06-16 LAB — TRIGLYCERIDES: Triglycerides: 126 mg/dL (ref <150)

## 2021-06-16 MED ORDER — ENOXAPARIN SODIUM 60 MG/0.6ML IJ SOSY
0.5000 mg/kg | PREFILLED_SYRINGE | INTRAMUSCULAR | Status: DC
  Administered 2021-06-17 – 2021-06-19 (×3): 60 mg via SUBCUTANEOUS
  Filled 2021-06-16 (×3): qty 0.6

## 2021-06-16 MED ORDER — INSULIN GLARGINE-YFGN 100 UNIT/ML ~~LOC~~ SOLN
25.0000 [IU] | Freq: Two times a day (BID) | SUBCUTANEOUS | Status: DC
  Administered 2021-06-16: 25 [IU] via SUBCUTANEOUS
  Filled 2021-06-16 (×2): qty 0.25

## 2021-06-16 MED ORDER — TRAVASOL 10 % IV SOLN
INTRAVENOUS | Status: AC
  Filled 2021-06-16: qty 1188

## 2021-06-16 MED ORDER — INSULIN GLARGINE-YFGN 100 UNIT/ML ~~LOC~~ SOLN
30.0000 [IU] | Freq: Two times a day (BID) | SUBCUTANEOUS | Status: DC
Start: 2021-06-16 — End: 2021-06-17
  Administered 2021-06-16: 30 [IU] via SUBCUTANEOUS
  Filled 2021-06-16 (×2): qty 0.3

## 2021-06-16 NOTE — Progress Notes (Signed)
Progress Note   Patient: [name] YWV:[id number] DOB: Dec 12, 1965 DOA: 06/08/2021     7 DOS: the patient was seen and examined on 06/16/2021   Brief hospital course: [name] is a 56 year old female with history of hypertension, hyperlipidemia, multiple hospitalization for small bowel obstructions, history of incisional hernia repair in 2013, anxiety, depression, asthma, history of idiopathic epilepsy/petit mall with last seizure being in 2012, controlled with Topamax, GERD, left eye legally blindness, sleep apnea who does not use CPAP at home, stress incontinence, who presents emergency department for chief concerns of generalized weakness, decreased appetite, nausea, vomiting.   5/22: Patient continued to have abdominal pain with nausea and vomiting.  Compazine was also added with Zofran. Patient was recently seen by general surgery and MR with enterography was ordered and scheduled for 5/28. Worsening leukocytosis. NG tube was placed with significant drainage. General surgery was also consulted for concern of another SBO. She will be most likely going to the OR for exploratory laparotomy as this is her third admission for similar reasons in the past 27-month   5/23: Symptoms are improving with NG tube, significant drainage of above 2 L in the past 24-hour.  Continue to have some diarrhea.  General surgery is planning exploratory laparotomy on 06/12/2021. PICC line to be placed today followed by starting TPN. Renal functions improving, creatinine at 1.05 today. Leukocytosis resolved, all cell lines decreased so there must be some dilutional effect. C. difficile and GI pathogen panel negative.   Assessment and Plan: Nausea and vomiting SBO N/v improved with NG tube placement.  Ng tube removed by surgery this morning. Had bm x2 overnight, soft.  Several hospitalizations for this. Ex lab 5/25 did not find adhesion or other source of mechanical SBO. Creeping fat at terminal  ileum is suspicious for crohn's disease.  - clears, advance as tolerated - continue tpn - GI consulted for IBD w/u. Will need outpt g/u f/u for luminal evaluation - treat constipation - PT consulted, advises HH PT  Hyperkalemia Resolved with IV fluid and discontinuation of spironolactone. -Continue to monitor  Seizure disorder (HCC) - continue home Keppra 500 mg daily, 750 mg nightly; gabapentin 600 mg twice daily, 1200 mg nightly - Fall precautions  AKI (acute kidney injury) ([place] Resolved. Presumed secondary to prerenal in setting of poor p.o. intake in setting of nausea and vomiting and diarrhea  OSA (obstructive sleep apnea) - CPAP nightly ordered  Essential hypertension, benign Blood pressure mildly elevated today -Continue home dose of carvedilol and hydralazine. -Hold spironolactone due to hyperkalemia -resumed home benicar  GERD without esophagitis - Protonix 40 mg IV daily  Type 2 diabetes mellitus with chronic kidney disease, with long-term current use of insulin (HCC) CBG elevated. Patient's insulin dosing is reported as insulin lispro, inject 160 units daily in divided doses as directed, Trulicity weekly injection -  SSI q4 - increase semglee to 30 bid - pharmacy now adding insulin to TPN  Obesity, Class III, BMI 40-49.9 (morbid obesity) ([place] Estimated body mass index is 40.7 kg/m as calculated from the following:   Height as of this encounter: 5' 6.5" (1.689 m).   Weight as of this encounter: 116.1 kg. This will complicate overall prognosis.  CAD (coronary artery disease) - Rosuvastatin 10 mg nightly, carvedilol 25 mg p.o. twice daily have been resumed - plavix resumed  Major depressive disorder, recurrent episode, moderate (HCC) - cont home duloxetine 60 mg daily, amitriptyline 70 to 75 mg nightly  Diarrhea Resolved. C diff  and gi pathogen panel neg  Dyslipidemia - Ezetimibe 10 mg daily, rosuvastatin 10 mg nightly resumed    Subjective: trying  some jello. No pain. 2 bms overnight, soft  Physical Exam: Vitals:   06/16/21 0400 06/16/21 0436 06/16/21 0736 06/16/21 0745  BP: (!) 183/101   (!) 165/100  Pulse: 96  93 93  Resp: 20  20 18   Temp: 97.9 F (36.6 C)   98.1 F (36.7 C)  TempSrc:    Oral  SpO2: 97%  96% 96%  Weight:  120 kg    Height:       General.  Morbidly obese lady, in no acute distress. Pulmonary.  Lungs clear bilaterally, normal respiratory effort. CV.  Regular rate and rhythm, no JVD, rub or murmur. Abdomen.  Soft, no tenderness, mod distention, BS positive. Midline incision dressed. C/d/i CNS.  Alert and oriented .  No focal neurologic deficit. Extremities.  No edema, no cyanosis, pulses intact and symmetrical. Psychiatry.  Judgment and insight appears normal.  Data Reviewed: Prior notes, labs and images reviewed.  Family Communication: Discussed with patient  Disposition: Status is: Inpatient Remains inpatient appropriate because: Severity of illness   Planned Discharge Destination: Home w/ HH PT (PT has evaluated)  DVT prophylaxis.  Lovenox Time spent: 30 minutes   Author: Desma Maxim, MD 06/16/2021 1:21 PM  For on call review [email or web address].

## 2021-06-16 NOTE — Progress Notes (Signed)
Subjective:  CC: [name] is a 56 y.o. female  Hospital stay day 7, 4 Days Post-Op  s/p exploratory laparotomy with lysis of adhesions. Intraoperatively she was found to have creeping fat in the terminal ileum, concerning for possible IBD.  HPI: Reports that NGT has been clamped since yesterday, she's had no need to resume suction, and reports two soft BM, and flatus.  She denies distention and abdominal pain.  More concerned with her BP variation.   ROS:  General: Denies weight loss, weight gain, fatigue, fevers, chills, and night sweats. Heart: Denies chest pain, palpitations, racing heart, irregular heartbeat, leg pain or swelling, and decreased activity tolerance. Respiratory: Denies breathing difficulty, shortness of breath, wheezing, cough, and sputum. GI: Denies change in appetite, heartburn, nausea, vomiting, constipation, diarrhea, and blood in stool. GU: Denies difficulty urinating, pain with urinating, urgency, frequency, blood in urine.   Objective:   Temp:  [97.9 F (36.6 C)-98.3 F (36.8 C)] 98.1 F (36.7 C) (05/29 0745) Pulse Rate:  [80-96] 93 (05/29 0745) Resp:  [18-20] 18 (05/29 0745) BP: (165-192)/(91-101) 165/100 (05/29 0745) SpO2:  [96 %-100 %] 96 % (05/29 0745) Weight:  [120 kg] 120 kg (05/29 0436)     Height: 5' 6.5" (168.9 cm) Weight: 120 kg BMI (Calculated): 42.07   Intake/Output this shift:   Intake/Output Summary (Last 24 hours) at 06/16/2021 1133 Last data filed at 06/16/2021 6720 Gross per 24 hour  Intake 1632.19 ml  Output 100 ml  Net 1532.19 ml   No stools recorded and NGT output 100 mL.   Constitutional :  alert, cooperative, appears stated age, and no distress  Respiratory:  clear to auscultation bilaterally  Cardiovascular:  regular rate and rhythm  Gastrointestinal: Soft no objective tenderness. Honeycomb dressing intact.     Skin: Cool and moist. Staple dressing c/d/i  Psychiatric: Normal affect, non-agitated, not confused        LABS:     Latest Ref Rng & Units 06/16/2021    4:17 AM 06/15/2021    5:22 AM 06/14/2021    3:28 AM  CMP  Glucose 70 - 99 mg/dL 269   269   280    BUN 6 - 20 mg/dL 23   25   28     Creatinine 0.44 - 1.00 mg/dL 0.79   0.85   0.97    Sodium 135 - 145 mmol/L 139   140   138    Potassium 3.5 - 5.1 mmol/L 3.8   4.1   4.3    Chloride 98 - 111 mmol/L 103   108   107    CO2 22 - 32 mmol/L 27   27   25     Calcium 8.9 - 10.3 mg/dL 8.7   8.6   8.6    Total Protein 6.5 - 8.1 g/dL 6.8      Total Bilirubin 0.3 - 1.2 mg/dL 0.3      Alkaline Phos 38 - 126 U/L 60      AST 15 - 41 U/L 14      ALT 0 - 44 U/L 16          Latest Ref Rng & Units 06/16/2021    4:17 AM 06/15/2021    5:22 AM 06/14/2021    3:28 AM  CBC  WBC 4.0 - 10.5 K/uL 8.8   9.3   10.5    Hemoglobin 12.0 - 15.0 g/dL 9.8   9.7   9.8    Hematocrit 36.0 -  46.0 % 31.2   31.2   31.6    Platelets 150 - 400 K/uL 253   232   237      RADS: N/a Assessment:   S/p  s/p exploratory laparotomy with lysis of adhesions. Intraoperatively she was found to have creeping fat in the terminal ileum, concerning for possible IBD.  NG clamped since yesterday per pt.  Remove NGT and initiate sips of CLD.   labs/images/medications/previous chart entries reviewed personally and relevant changes/updates noted above.

## 2021-06-16 NOTE — Progress Notes (Signed)
Patient is requesting a heating pad for her low back. This is what helps her at home. Order received from Dr Si Raider for a heating pad

## 2021-06-16 NOTE — Progress Notes (Signed)
PHARMACY - TOTAL PARENTERAL NUTRITION CONSULT NOTE   Indication: Small bowel obstruction  Patient Measurements: Height: 5' 6.5" (168.9 cm) Weight: 120 kg (264 lb 8.8 oz) IBW/kg (Calculated) : 60.45 TPN AdjBW (KG): 74.4 Body mass index is 42.06 kg/m. Usual Weight:    Assessment: Pt admitted with intractable nausea ad vomiting secondary to SBO,  hx multiple hospitalization for small bowel obstructions  Glucose / Insulin: BG 240-299 -on SSI resistant q4h,  Glargine 20 units BID,  insulin in TPN (Insulin per last 24 hrs: 62 units SSI, 35 units glargine -insulin in TPN bag increased from 10 units to 20 units on 5/28 1800 -was on insulin pump PTA  Electrolytes: WNL Renal: Scr<1(at baseline) Hepatic: AST 14//ALT 16 Intake / Output;  TG: 126 MIVF: none GI Imaging: GI Surgeries / Procedures:  Exploratory laparatomy with lysis of adhesions 06/12/21.   Central access: PICC placed 5/23 TPN start date: 5/23  Nutritional Goals: Goal TPN rate is 90 mL/hr (provides 118.8 g of protein and 2224 kcals per day),  fluids 2160 ml/24 hrs  RD Assessment: Estimated Needs Total Energy Estimated Needs: 2150-2350 Total Protein Estimated Needs: 110-125 grams Total Fluid Estimated Needs: > 2 L  Current Nutrition:  NPO  Plan:  -Continue TPN at full rate 51m/hr -Electrolytes in TPN: Na 550m/L, K 2566mL, Ca 5mE28m, Mg 5mEq66m and Phos 15mmo72m Cl:Ac 1:1 -Add standard MVI and trace elements to TPN -Will continue insulin in TPN bag at 20 units and continue Resistant q4h SSI  -will increase insulin glargine from 20 units bid to 25 units BID -Monitor TPN labs on Mon/Thurs, as appropriate.   Naquita Nappier A 06/16/2021,9:31 AM

## 2021-06-16 NOTE — Progress Notes (Signed)
Physical Therapy Treatment Patient Details Name: [name] MRN: [id number] DOB: 07/09/1965 Today's Date: 06/16/2021   History of Present Illness Ms. Rehmat Murtagh is a 56 year old female with history of hypertension, hyperlipidemia, multiple hospitalization for small bowel obstructions, history of incisional hernia repair in 2013, anxiety, depression, asthma, history of idiopathic epilepsy/petit mall with last seizure being in 2012, controlled with Topamax, GERD, left eye legally blindness, sleep apnea who does not use CPAP at home, stress incontinence, who presents emergency department for chief concerns of generalized weakness, decreased appetite, nausea, vomiting.    PT Comments    Pt seen for PT tx with pt received in bed & agreeable. Pt with documented elevated diastolic BP so PT checked vitals & due to ongoing elevated HR further mobility deferred at this time. After sitting at EOB pt voluntarily elects to transfer to standing & takes side steps to L along EOB to sit closer to [facility] with pt doing so without physical assistance. Pt returned to bed & all needs met. Will attempt to see pt tomorrow to progress mobility.  BP checked in LUE Semi fowler in bed: 168/105 mmHg MAP 120 Sitting EOB: 181/101 mmHg MAP 122     Recommendations for follow up therapy are one component of a multi-disciplinary discharge planning process, led by the attending physician.  Recommendations may be updated based on patient status, additional functional criteria and insurance authorization.  Follow Up Recommendations  Home health PT     Assistance Recommended at Discharge Intermittent Supervision/Assistance  Patient can return home with the following A little help with walking and/or transfers;A little help with bathing/dressing/bathroom;Assist for transportation;Help with stairs or ramp for entrance   Equipment Recommendations  None recommended by PT    Recommendations for Other Services        Precautions / Restrictions Precautions Precautions: Fall Restrictions Weight Bearing Restrictions: No     Mobility  Bed Mobility Overal bed mobility: Needs Assistance Bed Mobility: Supine to Sit, Sit to Supine     Supine to sit: Modified independent (Device/Increase time), HOB elevated Sit to supine: Modified independent (Device/Increase time), HOB elevated   General bed mobility comments: use of bed rails PRN    Transfers Overall transfer level: Modified independent Equipment used: None               General transfer comment: STS from EOB with UE support on bed rails    Ambulation/Gait                   Stairs             Wheelchair Mobility    Modified Rankin (Stroke Patients Only)       Balance Overall balance assessment: Needs assistance Sitting-balance support: Bilateral upper extremity supported, Feet supported Sitting balance-Leahy Scale: Good     Standing balance support: No upper extremity supported Standing balance-Leahy Scale: Fair                              Cognition Arousal/Alertness: Awake/alert Behavior During Therapy: WFL for tasks assessed/performed Overall Cognitive Status: Within Functional Limits for tasks assessed                                 General Comments: Pleasant lady        Exercises      General Comments  Pertinent Vitals/Pain Pain Assessment Pain Assessment: No/denies pain    Home Living                          Prior Function            PT Goals (current goals can now be found in the care plan section) Acute Rehab PT Goals Patient Stated Goal: to go home PT Goal Formulation: With patient Time For Goal Achievement: 06/27/21 Potential to Achieve Goals: Good Progress towards PT goals: PT to reassess next treatment    Frequency    Min 2X/week      PT Plan Current plan remains appropriate    Co-evaluation               AM-PAC PT "6 Clicks" Mobility   Outcome Measure  Help needed turning from your back to your side while in a flat bed without using bedrails?: None Help needed moving from lying on your back to sitting on the side of a flat bed without using bedrails?: A Little Help needed moving to and from a bed to a chair (including a wheelchair)?: A Little Help needed standing up from a chair using your arms (e.g., wheelchair or bedside chair)?: None Help needed to walk in hospital room?: A Little Help needed climbing 3-5 steps with a railing? : A Little 6 Click Score: 20    End of Session   Activity Tolerance: Treatment limited secondary to medical complications (Comment) Patient left: in bed;with call bell/phone within reach;with bed alarm set Nurse Communication:  (MD & nurse notified of elevated BP) PT Visit Diagnosis: Other abnormalities of gait and mobility (R26.89);Muscle weakness (generalized) (M62.81)     Time: 9798-9211 PT Time Calculation (min) (ACUTE ONLY): 12 min  Charges:  $Therapeutic Activity: 8-22 mins                     [name], PT, DPT 06/16/21, 2:20 PM    [name] 06/16/2021, 2:18 PM

## 2021-06-16 NOTE — Care Management Important Message (Signed)
Important Message  Patient Details  Name: [name] MRN: [id number] Date of Birth: Nov 02, 1965   Medicare Important Message Given:  Yes     Dannette Barbara 06/16/2021, 10:52 AM

## 2021-06-17 DIAGNOSIS — R112 Nausea with vomiting, unspecified: Secondary | ICD-10-CM | POA: Diagnosis not present

## 2021-06-17 LAB — MAGNESIUM: Magnesium: 2.1 mg/dL (ref 1.7–2.4)

## 2021-06-17 LAB — BASIC METABOLIC PANEL
Anion gap: 4 — ABNORMAL LOW (ref 5–15)
BUN: 29 mg/dL — ABNORMAL HIGH (ref 6–20)
CO2: 27 mmol/L (ref 22–32)
Calcium: 8.8 mg/dL — ABNORMAL LOW (ref 8.9–10.3)
Chloride: 104 mmol/L (ref 98–111)
Creatinine, Ser: 0.96 mg/dL (ref 0.44–1.00)
GFR, Estimated: >60 mL/min (ref >60)
Glucose, Bld: 345 mg/dL — ABNORMAL HIGH (ref 70–99)
Potassium: 4.1 mmol/L (ref 3.5–5.1)
Sodium: 135 mmol/L (ref 135–145)

## 2021-06-17 LAB — GLUCOSE, CAPILLARY
Glucose-Capillary: 274 mg/dL — ABNORMAL HIGH (ref 70–99)
Glucose-Capillary: 292 mg/dL — ABNORMAL HIGH (ref 70–99)
Glucose-Capillary: 301 mg/dL — ABNORMAL HIGH (ref 70–99)
Glucose-Capillary: 323 mg/dL — ABNORMAL HIGH (ref 70–99)
Glucose-Capillary: 328 mg/dL — ABNORMAL HIGH (ref 70–99)
Glucose-Capillary: 331 mg/dL — ABNORMAL HIGH (ref 70–99)
Glucose-Capillary: 332 mg/dL — ABNORMAL HIGH (ref 70–99)

## 2021-06-17 LAB — PHOSPHORUS: Phosphorus: 4 mg/dL (ref 2.5–4.6)

## 2021-06-17 MED ORDER — TRAVASOL 10 % IV SOLN
INTRAVENOUS | Status: AC
  Filled 2021-06-17: qty 1188

## 2021-06-17 MED ORDER — SPIRONOLACTONE 25 MG PO TABS
25.0000 mg | ORAL_TABLET | Freq: Every day | ORAL | Status: DC
  Administered 2021-06-17 – 2021-06-19 (×3): 25 mg via ORAL
  Filled 2021-06-17 (×3): qty 1

## 2021-06-17 MED ORDER — ENSURE MAX PROTEIN PO LIQD
11.0000 [oz_av] | Freq: Two times a day (BID) | ORAL | Status: DC
  Administered 2021-06-18 – 2021-06-19 (×4): 11 [oz_av] via ORAL
  Filled 2021-06-17: qty 330

## 2021-06-17 MED ORDER — INSULIN GLARGINE-YFGN 100 UNIT/ML ~~LOC~~ SOLN
40.0000 [IU] | Freq: Two times a day (BID) | SUBCUTANEOUS | Status: DC
  Administered 2021-06-17 – 2021-06-19 (×5): 40 [IU] via SUBCUTANEOUS
  Filled 2021-06-17 (×6): qty 0.4

## 2021-06-17 MED ORDER — ADULT MULTIVITAMIN W/MINERALS CH
1.0000 | ORAL_TABLET | Freq: Every day | ORAL | Status: DC
  Administered 2021-06-18 – 2021-06-19 (×2): 1 via ORAL
  Filled 2021-06-17 (×2): qty 1

## 2021-06-17 NOTE — Progress Notes (Signed)
Physical Therapy Treatment Patient Details Name: [name] MRN: [id number] DOB: 06-Dec-1965 Today's Date: 06/17/2021   History of Present Illness [name] is a 56 year old female with history of hypertension, hyperlipidemia, multiple hospitalization for small bowel obstructions, history of incisional hernia repair in 2013, anxiety, depression, asthma, history of idiopathic epilepsy/petit mall with last seizure being in 2012, controlled with Topamax, GERD, left eye legally blindness, sleep apnea who does not use CPAP at home, stress incontinence, who presents emergency department for chief concerns of generalized weakness, decreased appetite, nausea, vomiting.    PT Comments    Pt was in good spirits t/o the session reports continued pain but overall did not at all seem limited functionally with either abdominal(surgical) or chronic low back pain.  She was able to circumambulate the nurses station with safe and consistent effort.  Pt states she has been having HHPT and hopes to resume this when she is medically ready for d/c, this PT concurs.   Will continue to promote mobility per pt tolerance.  Recommendations for follow up therapy are one component of a multi-disciplinary discharge planning process, led by the attending physician.  Recommendations may be updated based on patient status, additional functional criteria and insurance authorization.  Follow Up Recommendations  Home health PT     Assistance Recommended at Discharge Intermittent Supervision/Assistance  Patient can return home with the following A little help with walking and/or transfers;A little help with bathing/dressing/bathroom;Assist for transportation;Help with stairs or ramp for entrance   Equipment Recommendations  None recommended by PT    Recommendations for Other Services       Precautions / Restrictions Precautions Precautions: Fall Restrictions Weight Bearing Restrictions: No      Mobility  Bed Mobility               General bed mobility comments: seated in recliner pre/post sessoin    Transfers Overall transfer level: Modified independent Equipment used: Rolling walker (2 wheels)               General transfer comment: able to rise to standing without assist, light reliance on walker    Ambulation/Gait Ambulation/Gait assistance: Supervision Gait Distance (Feet): 200 Feet Assistive device: Rolling walker (2 wheels)         General Gait Details: Pt was able to do prolonged bout of ambulation with consistent cadence and did not have excessive reliance on the walker and did not show any overt pain or hesitation t/o the effort.  Vitals stable and appropriate t/o the effort.   Stairs             Wheelchair Mobility    Modified Rankin (Stroke Patients Only)       Balance Overall balance assessment: Needs assistance Sitting-balance support: Feet supported, No upper extremity supported Sitting balance-Leahy Scale: Good     Standing balance support: Bilateral upper extremity supported Standing balance-Leahy Scale: Good Standing balance comment: appropriate walker reliance, not LOBs t/o the session                            Cognition Arousal/Alertness: Awake/alert Behavior During Therapy: WFL for tasks assessed/performed Overall Cognitive Status: Within Functional Limits for tasks assessed                                          Exercises  General Comments        Pertinent Vitals/Pain Pain Assessment Pain Assessment: 0-10 Pain Score: 9  Pain Location: reports severe L low back/hip/thigh that does not appear to limit her functionally    Home Living                          Prior Function            PT Goals (current goals can now be found in the care plan section) Progress towards PT goals: Progressing toward goals    Frequency    Min 2X/week      PT Plan  Current plan remains appropriate    Co-evaluation              AM-PAC PT "6 Clicks" Mobility   Outcome Measure  Help needed turning from your back to your side while in a flat bed without using bedrails?: None Help needed moving from lying on your back to sitting on the side of a flat bed without using bedrails?: None Help needed moving to and from a bed to a chair (including a wheelchair)?: A Little Help needed standing up from a chair using your arms (e.g., wheelchair or bedside chair)?: A Little Help needed to walk in hospital room?: A Little Help needed climbing 3-5 steps with a railing? : A Little 6 Click Score: 20    End of Session Equipment Utilized During Treatment: Gait belt Activity Tolerance: Treatment limited secondary to medical complications (Comment) Patient left: in bed;with call bell/phone within reach;with bed alarm set   PT Visit Diagnosis: Other abnormalities of gait and mobility (R26.89);Muscle weakness (generalized) (M62.81)     Time: 2751-7001 PT Time Calculation (min) (ACUTE ONLY): 15 min  Charges:  $Gait Training: 8-22 mins                     Kreg Shropshire, DPT 06/17/2021, 12:25 PM

## 2021-06-17 NOTE — Progress Notes (Signed)
[facility] Hospital Day(s): 8.   Post op day(s): 5 Days Post-Op.   Interval History:  Patient seen and examined No acute events or new complaints overnight.  Patient reports she had issues late yesterday around 1800 with pain and needd dilaudid but this is better this morning Abdominal soreness; most of her pain is in her back and legs No fever, chills, nausea, emesis Renal function is normal; sCr - 0.96; UO - unmeasured No electrolyte derangements  NGT removed yesterday (05/29) She has been started on CLD; tolerating; + bowel function  Working with therapies; recommending HHPT  Vital signs in last 24 hours: [min-max] current  Temp:  [97.9 F (36.6 C)-98.3 F (36.8 C)] 98.3 F (36.8 C) (05/30 0408) Pulse Rate:  [84-99] 84 (05/30 0408) Resp:  [18-20] 20 (05/30 0408) BP: (150-165)/(87-100) 150/89 (05/30 0408) SpO2:  [94 %-96 %] 95 % (05/30 0408)     Height: 5' 6.5" (168.9 cm) Weight: 120 kg BMI (Calculated): 42.07   Intake/Output last 2 shifts:  05/29 0701 - 05/30 0700 In: 2588.5 [P.O.:480; I.V.:2108.5] Out: -    Physical Exam:  Constitutional: alert, cooperative and no distress  Respiratory: breathing non-labored at rest  Cardiovascular: regular rate and sinus rhythm  Gastrointestinal: Soft, expected incisional soreness, non-distended, no rebound/guarding. Abdominal binder present Integumentary: Laparotomy is CDI with staples; honeycomb in place still; no drainage  Labs:     Latest Ref Rng & Units 06/16/2021    4:17 AM 06/15/2021    5:22 AM 06/14/2021    3:28 AM  CBC  WBC 4.0 - 10.5 K/uL 8.8   9.3   10.5    Hemoglobin 12.0 - 15.0 g/dL 9.8   9.7   9.8    Hematocrit 36.0 - 46.0 % 31.2   31.2   31.6    Platelets 150 - 400 K/uL 253   232   237        Latest Ref Rng & Units 06/17/2021    3:49 AM 06/16/2021    4:17 AM 06/15/2021    5:22 AM  CMP  Glucose 70 - 99 mg/dL 345   269   269    BUN 6 - 20 mg/dL 29   23   25      Creatinine 0.44 - 1.00 mg/dL 0.96   0.79   0.85    Sodium 135 - 145 mmol/L 135   139   140    Potassium 3.5 - 5.1 mmol/L 4.1   3.8   4.1    Chloride 98 - 111 mmol/L 104   103   108    CO2 22 - 32 mmol/L 27   27   27     Calcium 8.9 - 10.3 mg/dL 8.8   8.7   8.6    Total Protein 6.5 - 8.1 g/dL  6.8     Total Bilirubin 0.3 - 1.2 mg/dL  0.3     Alkaline Phos 38 - 126 U/L  60     AST 15 - 41 U/L  14     ALT 0 - 44 U/L  16        Imaging studies: No new pertinent imaging studies   Assessment/Plan: 56 y.o. female 5 Days Post-Op s/p exploratory laparotomy and lysis of adhesions for SBO incidentally found to have creeping fat intra-operatively concerning for possible IBD   - Will advance to full liquid diet for lunch  - Continue TPN; continue full rate today. If  she tolerates FLD today/tomorrow morning, we can begin weaning  - Monitor abdominal examination; on-going bowel function  - Continue abdominal binder   - Pain control prn; antiemetic prn - Mobilization encouraged ; worked with therapies; recommending HHPT  - Further management per primary service; we will follow    All of the above findings and recommendations were discussed with the patient, and the medical team, and all of patient's questions were answered to her expressed satisfaction.  -- Edison Simon, [name] [facility] 06/17/2021, 7:28 AM M-F: 7am - 4pm

## 2021-06-17 NOTE — Progress Notes (Signed)
Nutrition Follow Up Note   DOCUMENTATION CODES:   Morbid obesity  INTERVENTION:   TPN per pharmacy- currently at goal rate (provides 2224kcal/day and 119g/day protein)  Add Ensure Max protein supplement BID, each supplement provides 150kcal and 30g of protein.  Add MVI po daily   NUTRITION DIAGNOSIS:   Inadequate oral intake related to altered GI function as evidenced by NPO status.  GOAL:   Patient will meet greater than or equal to 90% of their needs -met with TPN   MONITOR:   PO intake, Supplement acceptance, Diet advancement, Labs, Weight trends, Skin, I & O's, TPN   ASSESSMENT:   56 y/o female with h/o seizures, DM, HTN, anxiety, MDD, HLD, OSA, CAD, GERD, brain hematoma, CVA, CHF, MI and incisional hernia s/p mesh repair 2013 who is admitted with SBO now s/p ex lap with LOA 5/25.  Pt tolerating TPN at goal rate. Pt with hyperglycemia; insulin increased in TPN for tonight. Triglycerides wnl. Refeed labs stable. NGT removed 5/29 and pt initiated on a clear liquid diet. Pt has been advanced to full liquids today. Pt having bowel movements today and is tolerating oral diet. RD will add supplements and MVI to help pt meet his estimated needs. Plan is to start weaning TPN tomorrow if oral intake is adequate. Per chart, pt is up ~8lbs since admission.   Medications reviewed and include: vitamin D3, plavix, plavix, colace, lovenox, insulin, protonix, miralax, aldactone   Labs reviewed: K 4.1 wnl, BUN 29(H), P 4.0 wnl, Mg 2.1 wnl Triglycerides 126- 5/29 Hgb 9.8(L), Hct 31.2(L)  Diet Order:   Diet Order             Diet full liquid Room service appropriate? Yes; Fluid consistency: Thin  Diet effective 1000                  EDUCATION NEEDS:   Education needs have been addressed  Skin:  Skin Assessment: Reviewed RN Assessment (Incision abdomen)  Last BM:  5/30- type 6  Height:   Ht Readings from Last 1 Encounters:  06/08/21 5' 6.5" (1.689 m)    Weight:   Wt  Readings from Last 1 Encounters:  06/16/21 120 kg    Ideal Body Weight:  62.5 kg  BMI:  Body mass index is 42.06 kg/m.  Estimated Nutritional Needs:   Kcal:  2200-2500kcal/day  Protein:  110-125 grams  Fluid:  1.8-2.1L/day  Koleen Distance MS, RD, LDN Please refer to [facility] for RD and/or RD on-call/weekend/after hours pager

## 2021-06-17 NOTE — Progress Notes (Signed)
Progress Note   Patient: [name] WJX:[id number] DOB: [birth year] DOA: 06/08/2021     8 DOS: the patient was seen and examined on 06/17/2021   Brief hospital course: Ms. Tashanda Fuhrer is a 56 year old female with history of hypertension, hyperlipidemia, multiple hospitalization for small bowel obstructions, history of incisional hernia repair in 2013, anxiety, depression, asthma, history of idiopathic epilepsy/petit mall with last seizure being in 2012, controlled with Topamax, GERD, left eye legally blindness, sleep apnea who does not use CPAP at home, stress incontinence, who presents emergency department for chief concerns of generalized weakness, decreased appetite, nausea, vomiting.   Assessment and Plan: Nausea and vomiting SBO Required NG tube for several days. Ng tube removed by surgery 5/29. Has since been having BMs and is tolerating advancement of diet.  Several recent hospitalizations for this problem. Ex lab 5/25 did not find adhesion or other source of mechanical SBO. Creeping fat at terminal ileum is suspicious for crohn's disease.  - adat - continue tpn, may be able to begin weaning tomorrow - GI consulted for IBD w/u. They advise outpatient f/u for endoluminal eval.   - treat constipation - PT consulted, advises HH PT  Hyperkalemia Resolved with IV fluid and discontinuation of spironolactone. -Continue to monitor  Seizure disorder ([place]) - continue home Keppra 500 mg daily, 750 mg nightly; gabapentin 600 mg twice daily, 1200 mg nightly - Fall precautions  AKI (acute kidney injury) ([place]) Resolved. Presumed secondary to prerenal in setting of poor p.o. intake in setting of nausea and vomiting and diarrhea  OSA (obstructive sleep apnea) - CPAP nightly ordered  Essential hypertension, benign Blood pressure mildly elevated today -Continue home dose of carvedilol and hydralazine. - resume home spiro, monitor k -resumed home benicar  GERD without  esophagitis - Protonix 40 mg IV daily  Type 2 diabetes mellitus with chronic kidney disease, with long-term current use of insulin (HCC) CBG elevated. Patient's insulin dosing is reported as insulin lispro, inject 160 units daily in divided doses as directed, Trulicity weekly injection -  SSI q4 - increase semglee to 30 bid - pharmacy now adding insulin to TPN  Obesity, Class III, BMI 40-49.9 (morbid obesity) ([place]) Estimated body mass index is 40.7 kg/m as calculated from the following:   Height as of this encounter: 5' 6.5" (1.689 m).   Weight as of this encounter: 116.1 kg. This will complicate overall prognosis.  CAD (coronary artery disease) - Rosuvastatin 10 mg nightly, carvedilol 25 mg p.o. twice daily have been resumed - plavix resumed  Major depressive disorder, recurrent episode, moderate (HCC) - cont home duloxetine 60 mg daily, amitriptyline 70 to 75 mg nightly  Diarrhea Resolved. C diff and gi pathogen panel neg  Dyslipidemia - Ezetimibe 10 mg daily, rosuvastatin 10 mg nightly resumed    Subjective: bm overnight, soft. Tolerating diet. Mild abd pain.  Physical Exam: Vitals:   06/16/21 2030 06/17/21 0408 06/17/21 0733 06/17/21 0743  BP:  (!) 150/89  (!) 157/85  Pulse:  84 86 83  Resp:  20 16   Temp:  98.3 F (36.8 C)  97.9 F (36.6 C)  TempSrc:  Oral    SpO2: 94% 95% 95% 97%  Weight:      Height:       General.  Morbidly obese lady, in no acute distress. Pulmonary.  Lungs clear bilaterally, normal respiratory effort. CV.  Regular rate and rhythm, no JVD, rub or murmur. Abdomen.  Soft, no tenderness, mod distention, BS positive. Midline incision  dressed. C/d/i CNS.  Alert and oriented .  No focal neurologic deficit. Extremities.  No edema, no cyanosis, pulses intact and symmetrical. Psychiatry.  Judgment and insight appears normal.  Data Reviewed: Prior notes, labs and images reviewed.  Family Communication: Discussed with  patient  Disposition: Status is: Inpatient Remains inpatient appropriate because: Severity of illness   Planned Discharge Destination: Home w/ HH PT (PT has evaluated)  DVT prophylaxis.  Lovenox Time spent: 30 minutes   Author: Desma Maxim, MD 06/17/2021 12:38 PM  For on call review [email or web address].

## 2021-06-17 NOTE — Progress Notes (Signed)
PHARMACY - TOTAL PARENTERAL NUTRITION CONSULT NOTE   Indication: Small bowel obstruction  Patient Measurements: Height: 5' 6.5" (168.9 cm) Weight: 120 kg (264 lb 8.8 oz) IBW/kg (Calculated) : 60.45 TPN AdjBW (KG): 74.4 Body mass index is 42.06 kg/m. Usual Weight:    Assessment: Pt admitted with intractable nausea ad vomiting secondary to SBO,  hx multiple hospitalization for small bowel obstructions  Glucose / Insulin: BG 332-345 -on SSI resistant q4h,  Glargine 30 units BID,  insulin in TPN (Insulin per last 24 hrs: 62 units SSI, 35 units glargine -insulin in TPN bag increased from 10 units to 20 units on 5/28 1800 -was on insulin pump PTA  Electrolytes: WNL Renal: Scr<1(at baseline) Hepatic: AST 14//ALT 16 Intake / Output;  TG: 126 MIVF: none GI Imaging: GI Surgeries / Procedures:  Exploratory laparatomy with lysis of adhesions 06/12/21.   Central access: PICC placed 5/23 TPN start date: 5/23   Nutritional Goals: Goal TPN rate is 90 mL/hr (provides 118.8 g of protein and 2224 kcals per day),  fluids 2160 ml/24 hrs  RD Assessment: Estimated Needs Total Energy Estimated Needs: 2150-2350 Total Protein Estimated Needs: 110-125 grams Total Fluid Estimated Needs: > 2 L  Current Nutrition:  Clear liquids  Plan:  -Continue TPN at full rate 72m/hr -Electrolytes in TPN: Na 548m/L, K 2551mL, Ca 5mE17m, Mg 5mEq60m and Phos 15mmo31m Cl:Ac 1:1 -Add standard MVI and trace elements to TPN -Will continue insulin in TPN bag at 20 units and continue Resistant q4h SSI  -Recommend increase insulin glargine from 30 units bid to 40 units bid -Monitor TPN labs on Mon/Thurs, as appropriate.   AndersDarrick Penna2023,7:39 AM

## 2021-06-18 ENCOUNTER — Ambulatory Visit: Payer: Medicare Other | Admitting: Surgery

## 2021-06-18 DIAGNOSIS — R112 Nausea with vomiting, unspecified: Secondary | ICD-10-CM | POA: Diagnosis not present

## 2021-06-18 LAB — GLUCOSE, CAPILLARY
Glucose-Capillary: 237 mg/dL — ABNORMAL HIGH (ref 70–99)
Glucose-Capillary: 246 mg/dL — ABNORMAL HIGH (ref 70–99)
Glucose-Capillary: 274 mg/dL — ABNORMAL HIGH (ref 70–99)
Glucose-Capillary: 279 mg/dL — ABNORMAL HIGH (ref 70–99)
Glucose-Capillary: 281 mg/dL — ABNORMAL HIGH (ref 70–99)
Glucose-Capillary: 296 mg/dL — ABNORMAL HIGH (ref 70–99)

## 2021-06-18 MED ORDER — TRAVASOL 10 % IV SOLN
INTRAVENOUS | Status: DC
  Filled 2021-06-18: qty 594

## 2021-06-18 MED ORDER — HYDROMORPHONE HCL 1 MG/ML IJ SOLN
1.0000 mg | Freq: Four times a day (QID) | INTRAMUSCULAR | Status: DC | PRN
  Administered 2021-06-18: 1 mg via INTRAVENOUS
  Filled 2021-06-18: qty 1

## 2021-06-18 MED ORDER — PANTOPRAZOLE SODIUM 40 MG PO TBEC
40.0000 mg | DELAYED_RELEASE_TABLET | Freq: Every day | ORAL | Status: DC
  Administered 2021-06-18 – 2021-06-19 (×2): 40 mg via ORAL
  Filled 2021-06-18 (×2): qty 1

## 2021-06-18 MED ORDER — OXYCODONE-ACETAMINOPHEN 5-325 MG PO TABS
1.0000 | ORAL_TABLET | Freq: Four times a day (QID) | ORAL | Status: DC | PRN
  Administered 2021-06-18 – 2021-06-19 (×4): 2 via ORAL
  Filled 2021-06-18 (×4): qty 2

## 2021-06-18 NOTE — Progress Notes (Signed)
PHARMACY - TOTAL PARENTERAL NUTRITION CONSULT NOTE   Indication: Small bowel obstruction  Patient Measurements: Height: 5' 6.5" (168.9 cm) Weight: 122.5 kg (270 lb 1 oz) IBW/kg (Calculated) : 60.45 TPN AdjBW (KG): 74.4 Body mass index is 42.94 kg/m. Usual Weight:    Assessment: Pt admitted with intractable nausea ad vomiting secondary to SBO,  hx multiple hospitalization for small bowel obstructions  Glucose / Insulin: BG 332-345>270-280s -on SSI resistant q4h,  Glargine 40 units BID,  insulin in TPN (Insulin per last 24 hrs: 67 units SSI, 80  units glargine -insulin in TPN bag increased from 20 units to 30 units on 5/31 1800 -was on insulin pump PTA  Electrolytes: WNL Renal: Scr<1(at baseline) Hepatic: AST 14//ALT 16 Intake / Output; net +5.9L TG: 126 MIVF: none GI Imaging: GI Surgeries / Procedures:  Exploratory laparatomy with lysis of adhesions 06/12/21.   Central access: PICC placed 5/23 TPN start date: 5/23   Nutritional Goals: Goal TPN rate is 90 mL/hr (provides 118.8 g of protein and 2224 kcals per day),  fluids 2160 ml/24 hrs  RD Assessment: Estimated Needs Total Energy Estimated Needs: 2200-2500kcal/day Total Protein Estimated Needs: 110-125 grams Total Fluid Estimated Needs: 1.8-2.1L/day  Current Nutrition:  Clear liquids  Plan:  Next BMP tomorrow 6/01; last lytes were stable WNL. Per d/w surgical team will decrease by 50% TPN rate today . Discuss plan to wean TPN further tomorrow. (net +5.9L) - Reduce TPN to 27m/hr -Electrolytes in TPN: Na 564m/L, K 2521mL, Ca 5mE41m, Mg 5mEq25m and Phos 15mmo16m Cl:Ac 1:1 -Add standard MVI and trace elements to TPN -Will continue insulin in TPN bag to 20 units and continue Resistant q4h SSI  -Insulin glargine now 40 units bid -Monitor TPN labs on Mon/Thurs, as appropriate.   [name] 06/18/2021,7:32 AM

## 2021-06-18 NOTE — TOC Progression Note (Signed)
Transition of Care [facility]) - Progression Note    Patient Details  Name: [name] MRN: [id number] Date of Birth: Apr 04, 1965  [facility] CM/SW Contact  Beverly Sessions, RN Phone Number: 06/18/2021, 2:48 PM  Clinical Narrative:    Soft Diet today.  Plan to taper TPN.  Will need resumption orders for home health PT and OT at discharge.  MD notified.    Expected Discharge Plan: [place] Barriers to Discharge: Continued Medical Work up  Expected Discharge Plan and Services Expected Discharge Plan: [place] Choice: Resumption of Svcs/PTA Provider Living arrangements for the past 2 months: [place]: PT [place]: [place] ([place]) Date [place]: 06/11/21   Representative spoke with at [place]: Floydene Flock   Social Determinants of Health (SDOH) Interventions    Readmission Risk Interventions    06/11/2021   11:43 AM 05/19/2021   12:02 PM 04/24/2021    3:02 PM  Readmission Risk Prevention Plan  Transportation Screening Complete Complete Complete  PCP or Specialist Appt within 5-7 Days  Complete   Home Care Screening  Complete   Medication Review (RN CM)  Complete   HRI or Home Care Consult   Complete  Palliative Care Screening   Not Applicable  Medication Review (RN Care Manager) Complete  Complete  PCP or Specialist appointment within 3-5 days of discharge Complete    HRI or [place] Complete    SW Recovery Care/Counseling Consult Complete    [place] Not Applicable

## 2021-06-18 NOTE — Progress Notes (Addendum)
[place] Hospital Day(s): 9.   Post op day(s): 6 Days Post-Op.   Interval History:  Patient seen and examined No acute events or new complaints overnight.  Patient reports she is doing well; some abdominal soreness No fever, chills, nausea, emesis No new labs this morning  Advanced to full liquid diet on 05/30; tolerating; + bowel function (5 BM recorded) On TPN; 90 ml/hr  Working with therapies; recommending HHPT  Vital signs in last 24 hours: [min-max] current  Temp:  [97.6 F (36.4 C)-98.2 F (36.8 C)] 98 F (36.7 C) (05/31 0434) Pulse Rate:  [78-86] 80 (05/31 0434) Resp:  [16-18] 18 (05/31 0434) BP: (129-157)/(62-85) 129/75 (05/31 0434) SpO2:  [95 %-100 %] 97 % (05/31 0434) Weight:  [122.5 kg] 122.5 kg (05/31 0421)     Height: 5' 6.5" (168.9 cm) Weight: 122.5 kg BMI (Calculated): 42.94   Intake/Output last 2 shifts:  05/30 0701 - 05/31 0700 In: 1630.6 [P.O.:500; I.V.:1130.6] Out: -    Physical Exam:  Constitutional: alert, cooperative and no distress  Respiratory: breathing non-labored at rest  Cardiovascular: regular rate and sinus rhythm  Gastrointestinal: Soft, expected incisional soreness, non-distended, no rebound/guarding. Abdominal binder present Integumentary: Laparotomy is CDI with staples; honeycomb in place still; no drainage  Labs:     Latest Ref Rng & Units 06/16/2021    4:17 AM 06/15/2021    5:22 AM 06/14/2021    3:28 AM  CBC  WBC 4.0 - 10.5 K/uL 8.8   9.3   10.5    Hemoglobin 12.0 - 15.0 g/dL 9.8   9.7   9.8    Hematocrit 36.0 - 46.0 % 31.2   31.2   31.6    Platelets 150 - 400 K/uL 253   232   237        Latest Ref Rng & Units 06/17/2021    3:49 AM 06/16/2021    4:17 AM 06/15/2021    5:22 AM  CMP  Glucose 70 - 99 mg/dL 345   269   269    BUN 6 - 20 mg/dL 29   23   25     Creatinine 0.44 - 1.00 mg/dL 0.96   0.79   0.85    Sodium 135 - 145 mmol/L 135   139   140    Potassium 3.5 - 5.1 mmol/L 4.1   3.8    4.1    Chloride 98 - 111 mmol/L 104   103   108    CO2 22 - 32 mmol/L 27   27   27     Calcium 8.9 - 10.3 mg/dL 8.8   8.7   8.6    Total Protein 6.5 - 8.1 g/dL  6.8     Total Bilirubin 0.3 - 1.2 mg/dL  0.3     Alkaline Phos 38 - 126 U/L  60     AST 15 - 41 U/L  14     ALT 0 - 44 U/L  16        Imaging studies: No new pertinent imaging studies   Assessment/Plan: 56 y.o. female 6 Days Post-Op s/p exploratory laparotomy and lysis of adhesions for SBO incidentally found to have creeping fat intra-operatively concerning for possible IBD   - Will advance to soft diet  - Continue TPN; Okay to wean to 1/2 rate today  - Monitor abdominal examination; on-going bowel function  - Continue abdominal binder   - Pain control prn; antiemetic  prn - Mobilization encouraged ; worked with therapies; recommending HHPT  - Further management per primary service; we will follow     - Discharge Planning; Pending advancement of diet and weaning from TPN; potentially home within next 48 hours   All of the above findings and recommendations were discussed with the patient, and the medical team, and all of patient's questions were answered to her expressed satisfaction.  -- Edison Simon, [name] [facility] 06/18/2021, 7:24 AM M-F: 7am - 4pm

## 2021-06-18 NOTE — Progress Notes (Signed)
[place] at [place] NAME: [name]    MR#:  [id number]  DATE OF BIRTH:  1965-05-13  SUBJECTIVE:   patient seen ambulating in the hallways with mobility therapist. She did eat soft diet today. So far doing well. Wants to try oral pain meds as well.   VITALS:  Blood pressure (!) 150/81, pulse 79, temperature 98.1 F (36.7 C), resp. rate 18, height 5' 6.5" (1.689 m), weight 122.5 kg, SpO2 99 %.  PHYSICAL EXAMINATION:   GENERAL:  56 y.o.-year-old patient lying in the bed with no acute distress. Morbidly obese LUNGS: Normal breath sounds bilaterally, no wheezing, rales, rhonchi.  CARDIOVASCULAR: S1, S2 normal. No murmurs, rubs, or gallops.  ABDOMEN: Soft, nontender, abdominal binder present. Surgery changes present EXTREMITIES: No  edema b/l.    NEUROLOGIC: nonfocal  patient is alert and awake   LABORATORY PANEL:  CBC Recent Labs  Lab 06/16/21 0417  WBC 8.8  HGB 9.8*  HCT 31.2*  PLT 253    Chemistries  Recent Labs  Lab 06/16/21 0417 06/17/21 0349  NA 139 135  K 3.8 4.1  CL 103 104  CO2 27 27  GLUCOSE 269* 345*  BUN 23* 29*  CREATININE 0.79 0.96  CALCIUM 8.7* 8.8*  MG 2.0 2.1  AST 14*  --   ALT 16  --   ALKPHOS 60  --   BILITOT 0.3  --    Assessment and Plan [name] is a 56 year old female with history of hypertension, hyperlipidemia, multiple hospitalization for small bowel obstructions, history of incisional hernia repair in 2013, anxiety, depression, asthma, history of idiopathic epilepsy/petit mall with last seizure being in 2012, controlled with Topamax, GERD, left eye legally blindness, sleep apnea who does not use CPAP at home, stress incontinence, who presents emergency department for chief concerns of generalized weakness, decreased appetite, nausea, vomiting.   Nausea and vomiting SBO Required NG tube for several days. Ng tube removed by surgery 5/29. Has since been having BMs and is tolerating  advancement of diet.  Several recent hospitalizations for this problem. Ex lab 5/25 did not find adhesion or other source of mechanical SBO. Creeping fat at terminal ileum is suspicious for crohn's disease.  - adat - continue tpn,  weaning started since pt able to tolerate po - GI consulted for IBD w/u. They advise outpatient f/u for endoluminal eval.   - treat constipation - PT consulted, advises HH PT   Hyperkalemia Resolved with IV fluid and discontinuation of spironolactone. -Continue to monitor   Seizure disorder ([place]) - continue home Keppra , gabapentin - Fall precautions   AKI (acute kidney injury) ([place]) Resolved. Presumed secondary to prerenal in setting of poor p.o. intake in setting of nausea and vomiting and diarrhea   OSA (obstructive sleep apnea) - CPAP nightly ordered   Essential hypertension, benign Blood pressure mildly elevated today -Continue home dose of carvedilol and hydralazine. - resume home spironolactone, benicar   GERD without esophagitis - Protonix    Type 2 diabetes mellitus with chronic kidney disease, with long-term current use of insulin ([place]) - pharmacy now adding insulin to TPN -SSI   Obesity, Class III, BMI 40-49.9 (morbid obesity) ([place]) Estimated body mass index is 40.7 kg/m as calculated from the following: This will complicate overall prognosis.   CAD (coronary artery disease) - Rosuvastatin, carvedilol  - plavix resumed   Major depressive disorder, recurrent episode, moderate (HCC) - cont home duloxetine , amitriptyline  Diarrhea Resolved. C diff and gi pathogen panel neg   Dyslipidemia - Ezetimibe, rosuvastatin       Procedures: Family communication :none today Consults :Surgery CODE STATUS: full DVT Prophylaxis :enoxaparin Level of care: Med-Surg Status is: Inpatient Remains inpatient appropriate because: patient improving tolerating PO diet. Discharge likely 1 to 2 days    TOTAL TIME TAKING CARE OF THIS  PATIENT: 35 minutes.  >50% time spent on counselling and coordination of care  Note: This dictation was prepared with Dragon dictation along with smaller phrase technology. Any transcriptional errors that result from this process are unintentional.  Fritzi Mandes M.D    [facility]   CC: Primary care physician; Delsa Grana, [name]

## 2021-06-19 DIAGNOSIS — R112 Nausea with vomiting, unspecified: Secondary | ICD-10-CM | POA: Diagnosis not present

## 2021-06-19 LAB — PHOSPHORUS: Phosphorus: 3.9 mg/dL (ref 2.5–4.6)

## 2021-06-19 LAB — COMPREHENSIVE METABOLIC PANEL
ALT: 182 U/L — ABNORMAL HIGH (ref 0–44)
AST: 138 U/L — ABNORMAL HIGH (ref 15–41)
Albumin: 3.1 g/dL — ABNORMAL LOW (ref 3.5–5.0)
Alkaline Phosphatase: 142 U/L — ABNORMAL HIGH (ref 38–126)
Anion gap: 5 (ref 5–15)
BUN: 23 mg/dL — ABNORMAL HIGH (ref 6–20)
CO2: 28 mmol/L (ref 22–32)
Calcium: 8.9 mg/dL (ref 8.9–10.3)
Chloride: 102 mmol/L (ref 98–111)
Creatinine, Ser: 0.8 mg/dL (ref 0.44–1.00)
GFR, Estimated: >60 mL/min (ref >60)
Glucose, Bld: 265 mg/dL — ABNORMAL HIGH (ref 70–99)
Potassium: 4.3 mmol/L (ref 3.5–5.1)
Sodium: 135 mmol/L (ref 135–145)
Total Bilirubin: 0.5 mg/dL (ref 0.3–1.2)
Total Protein: 7.2 g/dL (ref 6.5–8.1)

## 2021-06-19 LAB — GLUCOSE, CAPILLARY
Glucose-Capillary: 262 mg/dL — ABNORMAL HIGH (ref 70–99)
Glucose-Capillary: 214 mg/dL — ABNORMAL HIGH (ref 70–99)
Glucose-Capillary: 208 mg/dL — ABNORMAL HIGH (ref 70–99)

## 2021-06-19 LAB — TRIGLYCERIDES: Triglycerides: 71 mg/dL (ref <150)

## 2021-06-19 LAB — MAGNESIUM: Magnesium: 1.9 mg/dL (ref 1.7–2.4)

## 2021-06-19 MED ORDER — OXYCODONE-ACETAMINOPHEN 5-325 MG PO TABS: 1.0000 | ORAL_TABLET | Freq: Four times a day (QID) | ORAL | 0 refills | Status: DC | PRN

## 2021-06-19 MED ORDER — PANTOPRAZOLE SODIUM 40 MG PO TBEC: 40.0000 mg | DELAYED_RELEASE_TABLET | Freq: Every day | ORAL | 3 refills | Status: DC

## 2021-06-19 MED ORDER — ALUM & MAG HYDROXIDE-SIMETH 200-200-20 MG/5ML PO SUSP
30.0000 mL | ORAL | Status: DC | PRN
  Administered 2021-06-19: 30 mL via ORAL
  Filled 2021-06-19: qty 30

## 2021-06-19 NOTE — Progress Notes (Signed)
Physical Therapy Treatment Patient Details Name: [name] MRN: [id number] DOB: [birth year] Today's Date: 06/19/2021   History of Present Illness [name] is a 56 year old female with history of hypertension, hyperlipidemia, multiple hospitalization for small bowel obstructions, history of incisional hernia repair in 2013, anxiety, depression, asthma, history of idiopathic epilepsy/petit mall with last seizure being in 2012, controlled with Topamax, GERD, left eye legally blindness, sleep apnea who does not use CPAP at home, stress incontinence, who presents emergency department for chief concerns of generalized weakness, decreased appetite, nausea, vomiting.    PT Comments    Attempted to see pt for PT tx but pt reports she's already walked & requests PT come back. Pt returned later & pt agreeable. Pt is able to complete bed mobility & transfers with mod I & ambulate to stairwell & back with RW & mod I. Pt negotiates top step x 2 times with CGA & B rails. Pt reports she doesn't like to negotiate stairs & doesn't have rails at home but instead holds to her children's hands/they provide support. Pt is eager to d/c home & feel she'll be okay with their assistance. Pt encouraged to stay mobile vs sedentary upon return home.    Recommendations for follow up therapy are one component of a multi-disciplinary discharge planning process, led by the attending physician.  Recommendations may be updated based on patient status, additional functional criteria and insurance authorization.  Follow Up Recommendations  Home health PT     Assistance Recommended at Discharge Intermittent Supervision/Assistance  Patient can return home with the following A little help with bathing/dressing/bathroom;Assist for transportation;Help with stairs or ramp for entrance;Assistance with cooking/housework   Equipment Recommendations  None recommended by PT    Recommendations for Other Services        Precautions / Restrictions Precautions Precautions: Fall Restrictions Weight Bearing Restrictions: No     Mobility  Bed Mobility Overal bed mobility: Modified Independent Bed Mobility: Supine to Sit, Sit to Supine     Supine to sit: Modified independent (Device/Increase time), HOB elevated Sit to supine: Modified independent (Device/Increase time), HOB elevated        Transfers Overall transfer level: Modified independent Equipment used: Rolling walker (2 wheels)               General transfer comment: STS from EOB & BSC without assistance    Ambulation/Gait Ambulation/Gait assistance: Modified independent (Device/Increase time) Gait Distance (Feet): 100 Feet (+ 100 ft) Assistive device: Rolling walker (2 wheels) Gait Pattern/deviations: Decreased step length - right, Decreased step length - left, Step-through pattern, Decreased stride length Gait velocity: slightly decreased         Stairs Stairs: Yes Stairs assistance: Min assist Stair Management: Two rails Number of Stairs: 2 (1 step x twice) General stair comments: step-to pattern   Wheelchair Mobility    Modified Rankin (Stroke Patients Only)       Balance Overall balance assessment: Needs assistance Sitting-balance support: Feet supported, No upper extremity supported Sitting balance-Leahy Scale: Good     Standing balance support: During functional activity, Bilateral upper extremity supported Standing balance-Leahy Scale: Good Standing balance comment: Pt with continent void on BSC at end of session.                            Cognition Arousal/Alertness: Awake/alert Behavior During Therapy: WFL for tasks assessed/performed Overall Cognitive Status: Within Functional Limits for tasks assessed  Exercises      General Comments        Pertinent Vitals/Pain Pain Assessment Pain Assessment: No/denies pain     Home Living                          Prior Function            PT Goals (current goals can now be found in the care plan section) Acute Rehab PT Goals Patient Stated Goal: to go home PT Goal Formulation: With patient Time For Goal Achievement: 06/27/21 Potential to Achieve Goals: Good Progress towards PT goals: Progressing toward goals    Frequency    Min 2X/week      PT Plan Current plan remains appropriate    Co-evaluation              AM-PAC PT "6 Clicks" Mobility   Outcome Measure  Help needed turning from your back to your side while in a flat bed without using bedrails?: None Help needed moving from lying on your back to sitting on the side of a flat bed without using bedrails?: None Help needed moving to and from a bed to a chair (including a wheelchair)?: A Little Help needed standing up from a chair using your arms (e.g., wheelchair or bedside chair)?: None Help needed to walk in hospital room?: None Help needed climbing 3-5 steps with a railing? : A Little 6 Click Score: 22    End of Session   Activity Tolerance: Patient tolerated treatment well Patient left: in bed;with call bell/phone within reach   PT Visit Diagnosis: Other abnormalities of gait and mobility (R26.89);Muscle weakness (generalized) (M62.81)     Time: 5110-2111 PT Time Calculation (min) (ACUTE ONLY): 8 min  Charges:  $Therapeutic Activity: 8-22 mins                     [name], PT, DPT 06/19/21, 11:43 AM    [name] 06/19/2021, 11:41 AM

## 2021-06-19 NOTE — Progress Notes (Signed)
[place] [facility] Day(s): 10.   Post op day(s): 7 Days Post-Op.   Interval History:  Patient seen and examined No acute events or new complaints overnight.  Patient reports she is doing well; most of her complaints are back pain No fever, chills, nausea, emesis Labs are overall reassuring Advanced to soft diet; tolerating; + bowel function (none recorded overnight) On TPN; 45 ml/hr  Working with therapies; recommending HHPT  Vital signs in last 24 hours: [min-max] current  Temp:  [97.7 F (36.5 C)-98.3 F (36.8 C)] 97.7 F (36.5 C) (05/31 1932) Pulse Rate:  [78-92] 78 (06/01 0410) Resp:  [17-18] 17 (06/01 0410) BP: (129-169)/(72-96) 169/96 (06/01 0410) SpO2:  [96 %-100 %] 100 % (06/01 0410) Weight:  [125.8 kg] 125.8 kg (06/01 0500)     Height: 5' 6.5" (168.9 cm) Weight: 125.8 kg BMI (Calculated): 44.1   Intake/Output last 2 shifts:  05/31 0701 - 06/01 0700 In: 2063.6 [P.O.:840; I.V.:1223.6] Out: -    Physical Exam:  Constitutional: alert, cooperative and no distress  Respiratory: breathing non-labored at rest  Cardiovascular: regular rate and sinus rhythm  Gastrointestinal: Soft, expected incisional soreness, non-distended, no rebound/guarding. Abdominal binder present Integumentary: Laparotomy is CDI with staples; honeycomb removed; no drainage; no erythema   Labs:     Latest Ref Rng & Units 06/16/2021    4:17 AM 06/15/2021    5:22 AM 06/14/2021    3:28 AM  CBC  WBC 4.0 - 10.5 K/uL 8.8   9.3   10.5    Hemoglobin 12.0 - 15.0 g/dL 9.8   9.7   9.8    Hematocrit 36.0 - 46.0 % 31.2   31.2   31.6    Platelets 150 - 400 K/uL 253   232   237        Latest Ref Rng & Units 06/19/2021    4:17 AM 06/17/2021    3:49 AM 06/16/2021    4:17 AM  CMP  Glucose 70 - 99 mg/dL 265   345   269    BUN 6 - 20 mg/dL 23   29   23     Creatinine 0.44 - 1.00 mg/dL 0.80   0.96   0.79    Sodium 135 - 145 mmol/L 135   135   139    Potassium 3.5 - 5.1  mmol/L 4.3   4.1   3.8    Chloride 98 - 111 mmol/L 102   104   103    CO2 22 - 32 mmol/L 28   27   27     Calcium 8.9 - 10.3 mg/dL 8.9   8.8   8.7    Total Protein 6.5 - 8.1 g/dL 7.2    6.8    Total Bilirubin 0.3 - 1.2 mg/dL 0.5    0.3    Alkaline Phos 38 - 126 U/L 142    60    AST 15 - 41 U/L 138    14    ALT 0 - 44 U/L 182    16       Imaging studies: No new pertinent imaging studies   Assessment/Plan: 56 y.o. female 7 Days Post-Op s/p exploratory laparotomy and lysis of adhesions for SBO incidentally found to have creeping fat intra-operatively concerning for possible IBD   - Okay to continue soft diet  - Discontinue TPN today  - Monitor abdominal examination; on-going bowel function  - Continue abdominal binder at home  -  Pain control prn; antiemetic prn - Mobilization encouraged ; worked with therapies; recommending HHPT  - Further management per primary service; we will follow     - Discharge Planning; Okay for discharge home this afternoon, continue abdominal binder for home. I updated DC instructions. She can follow up in ~1 week for staple removal  All of the above findings and recommendations were discussed with the patient, and the medical team, and all of patient's questions were answered to her expressed satisfaction.  -- Edison Simon, [name] [facility] 06/19/2021, 7:11 AM M-F: 7am - 4pm

## 2021-06-19 NOTE — Discharge Instructions (Signed)
In addition to included general post-operative instructions,  Diet: Resume home diet.   Activity: No heavy lifting >20 pounds (children, pets, laundry, garbage) or strenuous activity for 6 weeks, but light activity and walking are encouraged. Do not drive or drink alcohol if taking narcotic pain medications or having pain that might distract from driving. Continue abdominal binder.   Wound care: You may shower/get incision wet with soapy water and pat dry (do not rub incisions), but no baths or submerging incision underwater until follow-up.   Medications: Resume all home medications. For mild to moderate pain: acetaminophen (Tylenol) or ibuprofen/naproxen (if no kidney disease). Combining Tylenol with alcohol can substantially increase your risk of causing liver disease. Narcotic pain medications, if prescribed, can be used for severe pain, though may cause nausea, constipation, and drowsiness. Do not combine Tylenol and Percocet (or similar) within a 6 hour period as Percocet (and similar) contain(s) Tylenol. If you do not need the narcotic pain medication, you do not need to fill the prescription.  Call office [phone] / [phone]) at any time if any questions, worsening pain, fevers/chills, bleeding, drainage from incision site, or other concerns.

## 2021-06-19 NOTE — Progress Notes (Signed)
Mobility Specialist - Progress Note    06/19/21 1100  Mobility  Activity Ambulated independently in hallway;Stood at bedside;Dangled on edge of bed  Level of Assistance Independent  Assistive Device Front wheel walker  Distance Ambulated (ft) 160 ft  Activity Response Tolerated well  $Mobility charge 1 Mobility    Pt sitting EOB upon arrival using RA. Completes STS indep and ambulates 1 lap indep voicing no complaints --- no rest breaks and denies pain. Pt is left EOB with needs in reach.  Merrily Brittle Mobility Specialist 06/19/21, 11:39 AM

## 2021-06-19 NOTE — Discharge Summary (Signed)
Physician Discharge Summary   Patient: [name] MRN: [id number] DOB: 12-Jun-1965  Admit date:     06/08/2021  Discharge date: 06/19/21  Discharge Physician: Fritzi Mandes   PCP: Delsa Grana, [name]   Recommendations at discharge:   follow-up with Dr. Hampton Abbot in 1 to 2 follow-up with your primary care physician in 1 to 2 week follow-up G.I. Dr. Virgina Jock in 1 to 2 week Discharge Diagnoses: small bowel obstruction-- improved   Hospital Course:  [name] is a 56 year old female with history of hypertension, hyperlipidemia, multiple hospitalization for small bowel obstructions, history of incisional hernia repair in 2013, anxiety, depression, asthma, history of idiopathic epilepsy/[place] with last seizure being in 2012, controlled with Topamax, GERD, left eye legally blindness, sleep apnea who does not use CPAP at home, stress incontinence, who presents emergency department for chief concerns of generalized weakness, decreased appetite, nausea, vomiting.     Nausea and vomiting SBO Required NG tube for several days. Ng tube removed by surgery 5/29. Has since been having BMs and is tolerating advancement of diet.  Several recent hospitalizations for this problem. Ex lab 5/25 did not find adhesion or other source of mechanical SBO. Creeping fat at terminal ileum is suspicious for crohn's disease.  - adat -recieved tpn,  weaned off since pt able to tolerate po - GI consulted for IBD w/u. They advise outpatient f/u for endoluminal eval.   - PT consulted, advises [facility] PT -- patient tolerating regular diet. Discontinue TPN. Okay from surgery standpoint to discharge   Seizure disorder [place]) - continue home Keppra , gabapentin - Fall precautions   AKI (acute kidney injury) ([name]) Resolved. Presumed secondary to prerenal in setting of poor p.o. intake in setting of nausea and vomiting and diarrhea -- creatinine 0.8   OSA (obstructive sleep apnea) - CPAP nightly ordered    Essential hypertension, benign Blood pressure mildly elevated today -Continue home dose of carvedilol ,hydralazine, spironolactone, benicar   GERD without esophagitis - Protonix    Type 2 diabetes mellitus with chronic kidney disease, with long-term current use of insulin (HCC) -SSI -- resume home meds at discharge   Obesity, Class III, BMI 40-49.9 (morbid obesity) ([place]) Estimated body mass index is 40.7 kg/m as calculated from the following:  CAD (coronary artery disease) - Rosuvastatin, carvedilol  - plavix resumed   Major depressive disorder, recurrent episode, moderate (HCC) - cont home duloxetine , amitriptyline    Diarrhea Resolved. C diff and gi pathogen panel neg   Dyslipidemia - Ezetimibe, rosuvastatin   overall appears at baseline. Ambulating with physical therapy. Will discharged to home with outpatient follow-up surgery PCP and G.I.      Consultants: surgery, G.I. Disposition: Home health Diet recommendation:  Discharge Diet Orders (From admission, onward)     Start     Ordered   06/19/21 0000  Diet - low sodium heart healthy        06/19/21 1135           Cardiac and Carb modified diet DISCHARGE MEDICATION: Allergies as of 06/19/2021       Reactions   Codeine Swelling, Other (See Comments)   Swelling and burning of mouth (inside)   Lactose Intolerance (gi) Nausea And Vomiting        Medication List     TAKE these medications    albuterol 1.25 MG/3ML nebulizer solution Commonly known as: ACCUNEB Inhale 1 ampule into the lungs every 6 (six) hours as needed for wheezing or shortness of  breath.   albuterol 108 (90 Base) MCG/ACT inhaler Commonly known as: ProAir HFA INHALE 2 PUFFS BY MOUTH EVERY 6 HOURS AS NEEDED FOR WHEEZING OR SHORTNESS OF BREATH   amitriptyline 25 MG tablet Commonly known as: ELAVIL Take 2-3 tablets (50-75 mg total) by mouth at bedtime.   baclofen 10 MG tablet Commonly known as: LIORESAL Take 1 tablet (10 mg  total) by mouth 3 (three) times daily as needed for muscle spasms.   bisacodyl 5 MG EC tablet Commonly known as: DULCOLAX Take 1 tablet (5 mg total) by mouth daily.   budesonide 0.5 MG/2ML nebulizer solution Commonly known as: PULMICORT Inhale 0.5 mg into the lungs 2 (two) times daily.   butalbital-acetaminophen-caffeine 50-325-40 MG tablet Commonly known as: FIORICET Take 1-2 tablets by mouth every 6 (six) hours as needed for headache.   carvedilol 25 MG tablet Commonly known as: COREG Take 25 mg by mouth 2 (two) times daily.   cholecalciferol 25 MCG (1000 UNIT) tablet Commonly known as: VITAMIN D3 Take 1,000 Units by mouth daily.   ciclopirox 8 % solution Commonly known as: PENLAC Apply topically at bedtime.   clopidogrel 75 MG tablet Commonly known as: PLAVIX Take 1 tablet (75 mg total) by mouth daily.   cycloSPORINE 0.05 % ophthalmic emulsion Commonly known as: RESTASIS Place 2 drops into both eyes 2 (two) times daily.   DULoxetine 60 MG capsule Commonly known as: Cymbalta Take 1 capsule (60 mg total) by mouth daily.   EPINEPHrine 0.3 mg/0.3 mL Soaj injection Commonly known as: EPI-PEN Inject 0.3 mg into the muscle as needed for anaphylaxis.   ezetimibe 10 MG tablet Commonly known as: Zetia Take 1 tablet (10 mg total) by mouth daily.   fluticasone 50 MCG/ACT nasal spray Commonly known as: FLONASE Place 2 sprays into both nostrils daily.   FreeStyle Libre 2 Sensor Misc Change every 14 days   gabapentin 600 MG tablet Commonly known as: Neurontin 600 mg qAM, 600 mg qPM, 1200 mg QHS   HumaLOG KwikPen 200 UNIT/ML KwikPen Generic drug: insulin lispro Inject up to 160 units daily in divided doses as directed   hydrALAZINE 50 MG tablet Commonly known as: APRESOLINE Take 1 tablet by mouth 3 (three) times daily.   levETIRAcetam 500 MG tablet Commonly known as: KEPPRA Take 500-750 mg by mouth See admin instructions. Take 1 tablet (586m) by mouth every  morning and take 1 tablets (7556m by mouth every night   meclizine 25 MG tablet Commonly known as: ANTIVERT Take 1 tablet (25 mg total) by mouth 2 (two) times daily as needed for dizziness.   mirabegron ER 50 MG Tb24 tablet Commonly known as: Myrbetriq Take 1 tablet (50 mg total) by mouth daily.   nitroGLYCERIN 0.4 MG SL tablet Commonly known as: NITROSTAT Place 0.4 mg under the tongue every 5 (five) minutes as needed for chest pain.   olmesartan-hydrochlorothiazide 40-25 MG tablet Commonly known as: BENICAR HCT TAKE 1 TABLET BY MOUTH EVERY DAY   Omnipod DASH Pods (Gen 4) Misc Change pod once daily   ondansetron 8 MG disintegrating tablet Commonly known as: ZOFRAN-ODT Take 1 tablet (8 mg total) by mouth every 8 (eight) hours as needed for nausea or vomiting.   oxyCODONE-acetaminophen 5-325 MG tablet Commonly known as: PERCOCET/ROXICET Take 1 tablet by mouth every 6 (six) hours as needed for moderate pain or severe pain.   pantoprazole 40 MG tablet Commonly known as: PROTONIX Take 1 tablet (40 mg total) by mouth daily. Start taking on:  June 20, 2021 What changed:  medication strength how much to take   Pen Needles 32G X 4 MM Misc Use with Humalog to fill pump daily   polyethylene glycol 17 g packet Commonly known as: MIRALAX / GLYCOLAX Take 17 g by mouth daily as needed for mild constipation.   potassium chloride SA 20 MEQ tablet Commonly known as: Klor-Con M20 You have not needed potassium while in the hospital.  Hold until outpatient followup. What changed:  how much to take how to take this when to take this additional instructions   rosuvastatin 10 MG tablet Commonly known as: CRESTOR Take 1 tablet (10 mg total) by mouth daily.   senna-docusate 8.6-50 MG tablet Commonly known as: Senokot-S Take 1 tablet by mouth 2 (two) times daily.   sodium phosphate 7-19 GM/118ML Enem Place 133 mLs (1 enema total) rectally daily as needed for severe constipation.    spironolactone 25 MG tablet Commonly known as: ALDACTONE Take 25 mg by mouth daily.   Trulicity 4.5 WU/9.8JX Sopn Generic drug: Dulaglutide Inject 4.5 mg as directed once a week.   Wixela Inhub 250-50 MCG/ACT Aepb Generic drug: fluticasone-salmeterol Inhale 1 puff into the lungs 2 (two) times daily.        Follow-up Information     Piscoya, Jacqulyn Bath, MD. Schedule an appointment as soon as possible for a visit in 1 week(s).   Specialty: General Surgery Why: s/p laparotomy, has staples....okay to see Johnna Acosta information: [address] [place] [address] [phone]         Delsa Grana, [name]. Schedule an appointment as soon as possible for a visit in 1 week(s).   Specialty: Family Medicine Contact information: [address] [place] [address] [phone]         Annamaria Helling, DO. Schedule an appointment as soon as possible for a visit in 2 week(s).   Specialty: Gastroenterology Why: hospital f/u Contact information: [place] [address] [phone]                Discharge Exam: Danley Danker Weights   06/16/21 0436 06/18/21 0421 06/19/21 0500  Weight: 120 kg 122.5 kg 125.8 kg     Condition at discharge: fair  The results of significant diagnostics from this hospitalization (including imaging, microbiology, ancillary and laboratory) are listed below for reference.   Imaging Studies: DG Abd 1 View  Result Date: 06/09/2021 CLINICAL DATA:  NG tube advanced. EXAM: ABDOMEN - 1 VIEW COMPARISON:  06/09/2021 FINDINGS: Since the previous study, the enteric tube has been advanced with tip now projected over the left upper quadrant consistent with location in the upper stomach. IMPRESSION: Enteric tube is advanced with tip now projecting in the region of the upper stomach. Electronically Signed   By: Lucienne Capers M.D.   On: 06/09/2021 20:06   DG Abdomen 1 View  Result  Date: 06/09/2021 CLINICAL DATA:  Post NG tube placement. EXAM: ABDOMEN - 1 VIEW COMPARISON:  CT abdomen pelvis-06/08/2021; chest radiograph-06/08/2021 FINDINGS: Enteric tube tip projects over the expected location of the distal esophagus. Moderate gaseous distension of several loops of large and small bowel as better demonstrated on abdominal CT performed day prior. Limited visualization the lower thorax suggest bibasilar heterogeneous opacities, left greater right, incompletely evaluated. No acute osseous abnormalities.  Stigmata of dish thoracic spine. IMPRESSION: 1. Enteric tube tip projects over the distal esophagus. Advancement at least 15 cm is advised. 2. Moderate gaseous distention of multiple  loops of large and small bowel as better demonstrated on abdominal CT performed day prior. Electronically Signed   By: Sandi Mariscal M.D.   On: 06/09/2021 10:06   CT ABDOMEN PELVIS W CONTRAST  Result Date: 06/08/2021 CLINICAL DATA:  Bowel obstruction suspected EXAM: CT ABDOMEN AND PELVIS WITH CONTRAST TECHNIQUE: Multidetector CT imaging of the abdomen and pelvis was performed using the standard protocol following bolus administration of intravenous contrast. RADIATION DOSE REDUCTION: This exam was performed according to the departmental dose-optimization program which includes automated exposure control, adjustment of the mA and/or kV according to patient size and/or use of iterative reconstruction technique. CONTRAST:  36m OMNIPAQUE IOHEXOL 300 MG/ML  SOLN COMPARISON:  None Available. FINDINGS: Lower chest: Bibasilar ground-glass opacity may relate to atelectasis. The distal esophagus is fluid-filled and the gastroesophageal junction is patulous which, together, suggest changes of gastroesophageal reflux. Cardiac size within normal limits. Hepatobiliary: No focal liver abnormality is seen. No gallstones, gallbladder wall thickening, or biliary dilatation. Pancreas: Unremarkable Spleen: Unremarkable Adrenals/Urinary  Tract: Stable 2.7 cm right adrenal nodule since remote prior examination of 05/12/2011 compatible with an adrenal adenoma. Left adrenal gland is unremarkable. The kidneys are normal in size and position. Multiple nonobstructing calculi are seen within the right kidney measuring up to 6 mm within the upper pole. No hydronephrosis. No ureteral calculi. The bladder is unremarkable. Stomach/Bowel: There is again noted incomplete duodenal rotation with the duodenum crossing midline, however, the fourth portion of the duodenum does not elevate to the level of the pylorus. Similar to multiple prior examinations, the stomach and proximal and mid small bowel is diffusely fluid-filled and dilated without injury discrete point of transition noted. The caliber of small bowel gradually transitions to a normal, decompressed caliber in the region the terminal ileum. Additionally, the proximal colon appears fluid filled. Together, the findings can be seen in the setting of an underlying infectious or inflammatory enterocolitis. No evidence of obstruction or focal inflammation. Trace mesenteric edema and ascites noted. Vascular/Lymphatic: No significant vascular findings are present. No enlarged abdominal or pelvic lymph nodes. Reproductive: Status post hysterectomy. No adnexal masses. Other: No abdominal wall hernia. Musculoskeletal: No acute bone abnormality. IMPRESSION: Diffusely dilated and fluid-filled stomach and proximal small bowel with gradual transition to more normal caliber terminal ileum. Additional fluid-filled proximal colon. The findings can be seen in the setting of an underlying infectious or inflammatory enterocolitis appears similar to numerous prior examinations. Mild mesenteric edema. No free air. Incomplete duodenal rotation. Fluid within the distal esophagus likely related to gastroesophageal reflux. Stable 2.7 cm right adrenal adenoma. Mild bilateral nonobstructing nephrolithiasis. Electronically Signed    By: AFidela SalisburyM.D.   On: 06/08/2021 22:22   DG Chest Port 1 View  Result Date: 06/10/2021 CLINICAL DATA:  PICC placement EXAM: PORTABLE CHEST 1 VIEW COMPARISON:  06/08/2021 FINDINGS: A right PICC catheter has been placed with tip projecting over the mid SVC region. No pneumothorax. An enteric tube is in place with tip off the field of view but below the left hemidiaphragm. Shallow inspiration with atelectasis or infiltration in the lung bases. Mild cardiac enlargement. No vascular congestion or edema. IMPRESSION: Appliances appear in satisfactory radiographic position. Shallow inspiration with infiltration or atelectasis in the lung bases. Electronically Signed   By: WLucienne CapersM.D.   On: 06/10/2021 15:21   DG Chest Port 1 View  Result Date: 06/08/2021 CLINICAL DATA:  Weakness and decreased appetite, hypoglycemia on presentation. EXAM: PORTABLE CHEST 1 VIEW COMPARISON:  PA chest 05/24/2021  FINDINGS: There is mild cardiomegaly with normal caliber of the central vessels, stable mediastinum with mild aortic tortuosity and atherosclerosis. The lungs hypoinflated with elevated right hemidiaphragm and limited view of the lower zones particularly on the right. There are perihilar atelectatic bands without visible pneumonia. No pleural effusion is seen. There is thoracic spondylosis and slight dextroscoliosis. IMPRESSION: 1. Low-inspiration study. There is limited visualization of the lower zones but no focal infiltrate in the visualized lungs. 2. Cardiomegaly without findings of CHF. 3. Aortic atherosclerosis. Electronically Signed   By: Telford Nab M.D.   On: 06/08/2021 21:19   DG Abd 2 Views  Result Date: 05/21/2021 CLINICAL DATA:  Abdominal pain.  Evaluate small bowel obstruction. EXAM: ABDOMEN - 2 VIEW COMPARISON:  KUB 05/20/2021, CT abdomen and pelvis 05/18/2021 FINDINGS: Air and stool are seen within the ascending and descending colon. Air is also seen throughout the transverse colon. Air is  again seen throughout multiple loops of small bowel measuring up to approximately 3.6 cm. Overall findings are similar to the prior CT which demonstrated areas of dilated bowel and decompressed bowel throughout the small bowel and air and fluid levels throughout the colon compatible with ileus. No portal venous gas or pneumatosis is seen. Evaluation for pneumoperitoneum is limited by supine technique, however none is seen. The lung bases are clear. Moderate bilateral femoroacetabular joint space narrowing. IMPRESSION: Air is seen throughout the small and large bowel with some small bowel loops dilated. This is again compatible with ileus. Note is made of question of partial small bowel obstruction versus ileus on prior 05/18/2021 CT. Electronically Signed   By: Yvonne Kendall M.D.   On: 05/21/2021 10:04   DG ABD ACUTE 2+V W 1V CHEST  Result Date: 05/24/2021 CLINICAL DATA:  Follow-up small bowel obstruction. EXAM: DG ABDOMEN ACUTE WITH 1 VIEW CHEST COMPARISON:  05/21/2021 FINDINGS: Nondilated small bowel loops are seen containing air-fluid levels. Air-fluid levels are also seen in the colon. This shows no significant change since prior study. No evidence of free intraperitoneal air. Heart size is normal.  Both lungs are clear. IMPRESSION: Nonspecific, nonobstructive bowel gas pattern. No active cardiopulmonary disease. Electronically Signed   By: Marlaine Hind M.D.   On: 05/24/2021 12:07   [name] EKG SITE RITE  Result Date: 06/10/2021 If Site Rite image not attached, placement could not be confirmed due to current cardiac rhythm.   Microbiology: Results for orders placed or performed during the hospital encounter of 06/08/21  Gastrointestinal Panel by PCR , Stool     Status: None   Collection Time: 06/08/21 10:43 PM   Specimen: Stool  Result Value Ref Range Status   Campylobacter species NOT DETECTED NOT DETECTED Final   Plesimonas shigelloides NOT DETECTED NOT DETECTED Final   Salmonella species NOT  DETECTED NOT DETECTED Final   Yersinia enterocolitica NOT DETECTED NOT DETECTED Final   Vibrio species NOT DETECTED NOT DETECTED Final   Vibrio cholerae NOT DETECTED NOT DETECTED Final   Enteroaggregative E coli (EAEC) NOT DETECTED NOT DETECTED Final   Enteropathogenic E coli (EPEC) NOT DETECTED NOT DETECTED Final   Enterotoxigenic E coli (ETEC) NOT DETECTED NOT DETECTED Final   Shiga like toxin producing E coli (STEC) NOT DETECTED NOT DETECTED Final   Shigella/Enteroinvasive E coli (EIEC) NOT DETECTED NOT DETECTED Final   Cryptosporidium NOT DETECTED NOT DETECTED Final   Cyclospora cayetanensis NOT DETECTED NOT DETECTED Final   Entamoeba histolytica NOT DETECTED NOT DETECTED Final   Giardia lamblia NOT DETECTED NOT  DETECTED Final   Adenovirus F40/41 NOT DETECTED NOT DETECTED Final   Astrovirus NOT DETECTED NOT DETECTED Final   Norovirus GI/GII NOT DETECTED NOT DETECTED Final   Rotavirus A NOT DETECTED NOT DETECTED Final   Sapovirus (I, II, IV, and V) NOT DETECTED NOT DETECTED Final    Comment: Performed at [facility], [address], [place], [place] [address]  C Difficile Quick Screen w PCR reflex     Status: None   Collection Time: 06/08/21 10:44 PM   Specimen: STOOL  Result Value Ref Range Status   C Diff antigen NEGATIVE NEGATIVE Final   C Diff toxin NEGATIVE NEGATIVE Final   C Diff interpretation No C. difficile detected.  Final    Comment: Performed at [facility], [address], [place], [place] [address]  Surgical pcr screen     Status: None   Collection Time: 06/12/21  4:09 AM   Specimen: Nasal Mucosa; Nasal Swab  Result Value Ref Range Status   MRSA, PCR NEGATIVE NEGATIVE Final   Staphylococcus aureus NEGATIVE NEGATIVE Final    Comment: (NOTE) The Xpert SA Assay (FDA approved for NASAL specimens in patients 38 years of age and older), is one component of a comprehensive surveillance program. It is not intended to diagnose infection nor to guide  or monitor treatment. Performed at [facility] [address] [place], [place]  [address]    *Note: Due to a large number of results and/or encounters for the requested time period, some results have not been displayed. A complete set of results can be found in Results Review.    Labs: CBC: Recent Labs  Lab 06/13/21 0420 06/14/21 0328 06/15/21 0522 06/16/21 0417  WBC 10.0 10.5 9.3 8.8  HGB 10.1* 9.8* 9.7* 9.8*  HCT 33.0* 31.6* 31.2* 31.2*  MCV 93.2 93.8 92.0 91.0  PLT 242 237 232 967   Basic Metabolic Panel: Recent Labs  Lab 06/14/21 0328 06/15/21 0522 06/16/21 0417 06/17/21 0349 06/19/21 0417  NA 138 140 139 135 135  K 4.3 4.1 3.8 4.1 4.3  CL 107 108 103 104 102  CO2 25 27 27 27 28   GLUCOSE 280* 269* 269* 345* 265*  BUN 28* 25* 23* 29* 23*  CREATININE 0.97 0.85 0.79 0.96 0.80  CALCIUM 8.6* 8.6* 8.7* 8.8* 8.9  MG 2.2 2.1 2.0 2.1 1.9  PHOS  --  4.0 4.1 4.0 3.9   Liver Function Tests: Recent Labs  Lab 06/16/21 0417 06/19/21 0417  AST 14* 138*  ALT 16 182*  ALKPHOS 60 142*  BILITOT 0.3 0.5  PROT 6.8 7.2  ALBUMIN 3.0* 3.1*   CBG: Recent Labs  Lab 06/18/21 1935 06/18/21 2358 06/19/21 0359 06/19/21 0742 06/19/21 1139  GLUCAP 237* 246* 262* 214* 208*    Discharge time spent: greater than 30 minutes.  Signed: Fritzi Mandes, MD [facility] 06/19/2021

## 2021-06-20 ENCOUNTER — Telehealth: Payer: Self-pay | Admitting: Family Medicine

## 2021-06-20 ENCOUNTER — Telehealth: Payer: Self-pay

## 2021-06-20 NOTE — Telephone Encounter (Signed)
Copied from [place] [id number]. Topic: General - Other >> Jun 20, 2021 11:26 AM McGill, Nelva Bush wrote: Reason for [id number] from [facility] stated that she received a referral for home health services needs ok to start on Monday, 06/05.

## 2021-06-20 NOTE — Telephone Encounter (Signed)
Transition Care Management Unsuccessful Follow-up Telephone Call  Date of discharge and from where:  06/19/21 [facility]  Attempts:  1st Attempt  Reason for unsuccessful TCM follow-up call:  Left voice message

## 2021-06-20 NOTE — Telephone Encounter (Signed)
Completed.

## 2021-06-23 ENCOUNTER — Ambulatory Visit (INDEPENDENT_AMBULATORY_CARE_PROVIDER_SITE_OTHER): Payer: Medicare Other | Admitting: Family Medicine

## 2021-06-23 ENCOUNTER — Telehealth: Payer: Self-pay

## 2021-06-23 ENCOUNTER — Emergency Department: Payer: Medicare Other

## 2021-06-23 ENCOUNTER — Encounter: Payer: Self-pay | Admitting: Family Medicine

## 2021-06-23 ENCOUNTER — Telehealth: Payer: Self-pay | Admitting: Family Medicine

## 2021-06-23 ENCOUNTER — Emergency Department
Admission: EM | Admit: 2021-06-23 | Discharge: 2021-06-23 | Disposition: A | Discharge status: Home / Self Care | Payer: Medicare Other | Attending: Emergency Medicine | Admitting: Emergency Medicine

## 2021-06-23 VITALS — BP 144/78 | HR 82 | Temp 98.1°F | Resp 16 | Ht 66.5 in | Wt 239.6 lb

## 2021-06-23 DIAGNOSIS — N179 Acute kidney failure, unspecified: Secondary | ICD-10-CM | POA: Diagnosis not present

## 2021-06-23 DIAGNOSIS — E876 Hypokalemia: Secondary | ICD-10-CM

## 2021-06-23 DIAGNOSIS — Z794 Long term (current) use of insulin: Secondary | ICD-10-CM | POA: Insufficient documentation

## 2021-06-23 DIAGNOSIS — E11649 Type 2 diabetes mellitus with hypoglycemia without coma: Secondary | ICD-10-CM | POA: Diagnosis not present

## 2021-06-23 DIAGNOSIS — R531 Weakness: Secondary | ICD-10-CM | POA: Diagnosis present

## 2021-06-23 DIAGNOSIS — I509 Heart failure, unspecified: Secondary | ICD-10-CM | POA: Diagnosis not present

## 2021-06-23 DIAGNOSIS — Z09 Encounter for follow-up examination after completed treatment for conditions other than malignant neoplasm: Secondary | ICD-10-CM | POA: Diagnosis not present

## 2021-06-23 DIAGNOSIS — R4182 Altered mental status, unspecified: Secondary | ICD-10-CM

## 2021-06-23 DIAGNOSIS — N390 Urinary tract infection, site not specified: Secondary | ICD-10-CM

## 2021-06-23 DIAGNOSIS — R7401 Elevation of levels of liver transaminase levels: Secondary | ICD-10-CM

## 2021-06-23 DIAGNOSIS — E162 Hypoglycemia, unspecified: Secondary | ICD-10-CM

## 2021-06-23 DIAGNOSIS — K56609 Unspecified intestinal obstruction, unspecified as to partial versus complete obstruction: Secondary | ICD-10-CM

## 2021-06-23 DIAGNOSIS — D649 Anemia, unspecified: Secondary | ICD-10-CM

## 2021-06-23 LAB — CBC WITH DIFFERENTIAL/PLATELET
Abs Immature Granulocytes: 0.07 10*3/uL (ref 0.00–0.07)
Basophils Absolute: 0 10*3/uL (ref 0.0–0.1)
Basophils Relative: 0 %
Eosinophils Absolute: 0 10*3/uL (ref 0.0–0.5)
Eosinophils Relative: 0 %
HCT: 34.4 % — ABNORMAL LOW (ref 36.0–46.0)
Hemoglobin: 10.6 g/dL — ABNORMAL LOW (ref 12.0–15.0)
Immature Granulocytes: 1 %
Lymphocytes Relative: 21 %
Lymphs Abs: 2.1 10*3/uL (ref 0.7–4.0)
MCH: 28.5 pg (ref 26.0–34.0)
MCHC: 30.8 g/dL (ref 30.0–36.0)
MCV: 92.5 fL (ref 80.0–100.0)
Monocytes Absolute: 0.7 10*3/uL (ref 0.1–1.0)
Monocytes Relative: 7 %
Neutro Abs: 7 10*3/uL (ref 1.7–7.7)
Neutrophils Relative %: 71 %
Platelets: 410 10*3/uL — ABNORMAL HIGH (ref 150–400)
RBC: 3.72 MIL/uL — ABNORMAL LOW (ref 3.87–5.11)
RDW: 14.6 % (ref 11.5–15.5)
WBC: 9.8 10*3/uL (ref 4.0–10.5)
nRBC: 0 % (ref 0.0–0.2)

## 2021-06-23 LAB — COMPREHENSIVE METABOLIC PANEL
ALT: 95 U/L — ABNORMAL HIGH (ref 0–44)
AST: 18 U/L (ref 15–41)
Albumin: 3.4 g/dL — ABNORMAL LOW (ref 3.5–5.0)
Alkaline Phosphatase: 130 U/L — ABNORMAL HIGH (ref 38–126)
Anion gap: 6 (ref 5–15)
BUN: 17 mg/dL (ref 6–20)
CO2: 29 mmol/L (ref 22–32)
Calcium: 8.9 mg/dL (ref 8.9–10.3)
Chloride: 103 mmol/L (ref 98–111)
Creatinine, Ser: 0.88 mg/dL (ref 0.44–1.00)
GFR, Estimated: >60 mL/min (ref >60)
Glucose, Bld: 33 mg/dL — CL (ref 70–99)
Potassium: 3.7 mmol/L (ref 3.5–5.1)
Sodium: 138 mmol/L (ref 135–145)
Total Bilirubin: 0.5 mg/dL (ref 0.3–1.2)
Total Protein: 7.3 g/dL (ref 6.5–8.1)

## 2021-06-23 LAB — LIPASE, BLOOD: Lipase: 34 U/L (ref 11–51)

## 2021-06-23 LAB — CBG MONITORING, ED
Glucose-Capillary: 51 mg/dL — ABNORMAL LOW (ref 70–99)
Glucose-Capillary: 78 mg/dL (ref 70–99)
Glucose-Capillary: 70 mg/dL (ref 70–99)
Glucose-Capillary: 85 mg/dL (ref 70–99)

## 2021-06-23 LAB — LACTIC ACID, PLASMA: Lactic Acid, Venous: 1.2 mmol/L (ref 0.5–1.9)

## 2021-06-23 LAB — GLUCOSE, POCT (MANUAL RESULT ENTRY)
POC Glucose: 46 mg/dl — AB (ref 70–99)
POC Glucose: 62 mg/dl — AB (ref 70–99)

## 2021-06-23 MED ORDER — GLUCOSE-VITAMIN C 4-6 GM-MG PO CHEW: 1.0000 | CHEWABLE_TABLET | Freq: Once | ORAL | Status: DC

## 2021-06-23 MED ORDER — SODIUM CHLORIDE 0.9 % IV BOLUS
1000.0000 mL | Freq: Once | INTRAVENOUS | Status: AC
  Administered 2021-06-23: 1000 mL via INTRAVENOUS

## 2021-06-23 MED ORDER — HYDRALAZINE HCL 50 MG PO TABS
50.0000 mg | ORAL_TABLET | Freq: Three times a day (TID) | ORAL | Status: DC
  Administered 2021-06-23: 50 mg via ORAL
  Filled 2021-06-23: qty 1

## 2021-06-23 MED ORDER — DEXTROSE 50 % IV SOLN
1.0000 | Freq: Once | INTRAVENOUS | Status: AC
  Administered 2021-06-23: 50 mL via INTRAVENOUS
  Filled 2021-06-23: qty 50

## 2021-06-23 NOTE — ED Provider Notes (Signed)
[facility] Provider Note    Event Date/Time   First MD Initiated Contact with Patient 06/23/21 1519     (approximate)   History   Chief Complaint: Altered Mental Status   HPI  Jacari Kirsten is a 56 y.o. female with a history of heart failure, seizure disorder, insulin-dependent diabetes, GERD who went to her doctor's office today for follow-up, had generalized weakness and was found to have a blood sugar of about 40.  They gave orange juice, but blood pressure after initial improving remained at about 40 so she was sent to the ED.  She also had an episode of syncope in the clinic.  Patient reports that she has been compliant with her medications.  She reports she had an exploratory abdominal surgery a few months ago and reports poor oral intake since then.  No vomiting or diarrhea.  No fever.  No chest pain or shortness of breath or cough or dysuria.  Outside records from primary care visit today reviewed which note the patient was recently admitted on May 21, discharged on June 1, had symptoms of small bowel obstruction.  She had an exploratory laparotomy during hospitalization and underwent lysis of adhesions.  There were findings suggestive of IBD so she was referred to GI.     Physical Exam   Triage Vital Signs: ED Triage Vitals [06/23/21 1526]  Enc Vitals Group     BP 139/83     Pulse Rate (!) 58     Resp 16     Temp      Temp Source Oral     SpO2 100 %     Weight      Height      Head Circumference      Peak Flow      Pain Score 0     Pain Loc      Pain Edu?      Excl. in [place]?     Most recent vital signs: Vitals:   06/23/21 1800 06/23/21 1830  BP: (!) 186/102 (!) 190/103  Pulse: 65 63  Resp: 20 19  SpO2: 99% 100%    General: Awake, no distress.  Low energy CV:  Good peripheral perfusion.  Regular rate and rhythm Resp:  Normal effort.  Clear to auscultation bilateral Abd:  No distention.  Soft and nontender Other:  No  lower extremity edema.  Moist oral mucosa.   ED Results / Procedures / Treatments   Labs (all labs ordered are listed, but only abnormal results are displayed) Labs Reviewed  COMPREHENSIVE METABOLIC PANEL - Abnormal; Notable for the following components:      Result Value   Glucose, Bld 33 (*)    Albumin 3.4 (*)    ALT 95 (*)    Alkaline Phosphatase 130 (*)    All other components within normal limits  CBC WITH DIFFERENTIAL/PLATELET - Abnormal; Notable for the following components:   RBC 3.72 (*)    Hemoglobin 10.6 (*)    HCT 34.4 (*)    Platelets 410 (*)    All other components within normal limits  CBG MONITORING, ED - Abnormal; Notable for the following components:   Glucose-Capillary 51 (*)    All other components within normal limits  CULTURE, BLOOD (ROUTINE X 2)  CULTURE, BLOOD (ROUTINE X 2)  LIPASE, BLOOD  LACTIC ACID, PLASMA  LACTIC ACID, PLASMA  URINALYSIS, ROUTINE W REFLEX MICROSCOPIC  CBG MONITORING, ED  CBG MONITORING, ED  CBG  MONITORING, ED     EKG Interpreted by me Sinus bradycardia rate of 55, normal axis and intervals.  Normal QRS ST segments and T waves.   RADIOLOGY Chest x-ray viewed and interpreted by me, appears unremarkable.  Radiology report reviewed.   PROCEDURES:  .Critical Care Performed by: Carrie Mew, MD Authorized by: Carrie Mew, MD   Critical care provider statement:    Critical care time (minutes):  35   Critical care time was exclusive of:  Separately billable procedures and treating other patients   Critical care was necessary to treat or prevent imminent or life-threatening deterioration of the following conditions:  Endocrine crisis and CNS failure or compromise   Critical care was time spent personally by me on the following activities:  Development of treatment plan with patient or surrogate, discussions with consultants, evaluation of patient's response to treatment, examination of patient, obtaining history  from patient or surrogate, ordering and performing treatments and interventions, ordering and review of laboratory studies, ordering and review of radiographic studies, pulse oximetry, re-evaluation of patient's condition and review of old [place] ED: Medications  hydrALAZINE (APRESOLINE) tablet 50 mg (has no administration in time range)  dextrose 50 % solution 50 mL (50 mLs Intravenous Given 06/23/21 1540)  sodium chloride 0.9 % bolus 1,000 mL (0 mLs Intravenous Stopped 06/23/21 1854)     IMPRESSION / MDM / [place] / ED COURSE  I reviewed the triage vital signs and the nursing notes.                              Differential diagnosis includes, but is not limited to, dehydration, insulin overuse, electrolyte abnormality, metabolic acidosis, UTI, pneumonia  Patient's presentation is most consistent with acute presentation with potential threat to life or bodily function.  Patient presents with persistent hypoglycemia in primary care clinic.  Blood sugar again was about 40 on arrival to the ED so she was given IV D50.  Will monitor blood sugar, check labs chest x-ray urinalysis.  IV fluids for hydration.  ----------------------------------------- 7:15 PM on 06/23/2021 ----------------------------------------- Labs unremarkable.  Chest x-ray unremarkable.  Vitals remained stable.  Blood sugar remained stable in the ED, patient tolerating oral intake.  We will have her decrease her insulin dose and follow-up with endocrinology.  Blood pressure severely elevated on last couple of checks, I think this is because she is overdue for hydralazine which I have ordered.       FINAL CLINICAL IMPRESSION(S) / ED DIAGNOSES   Final diagnoses:  Type 2 diabetes mellitus with hypoglycemia without coma, with long-term current use of insulin ([place])     Rx / DC Orders   ED Discharge Orders     None        Note:  This document was prepared using Dragon voice  recognition software and may include unintentional dictation errors.   Carrie Mew, MD 06/23/21 [id number]

## 2021-06-23 NOTE — Addendum Note (Signed)
Addended by: Docia Furl on: 06/23/2021 03:47 PM   Modules accepted: Orders

## 2021-06-23 NOTE — Progress Notes (Addendum)
Name: [name]   MRN: [id number]    DOB: [birth year]   Date:06/23/2021       Progress Note  Chief Complaint  Patient presents with   Hospitalization Follow-up    Dx with AKI, pt states still feels nauseous, still has the same pain     Subjective:   [name] is a 56 y.o. female, presents to clinic for HFU  Here for hospital follow up/transition of care.  Admit date: 06/08/2021 Discharge date: 06/19/2021 Transition of care was initiated previously by [name] on 06/20/2021 and med changes, diagnosis, specialist follow ups and pts symptoms and condition were all reviewed.  Pt was admitted for intractable N/V, abdominal bloating and concern for SBO - exlap was neg for SBO, adhesions were lysed and other findings were concerning for IBD - GI consulted and pt to f/up with GI  New medications started per hospitalization include - pt cannot say Labs due today are  Pt feels in pain, - abd pain- pain all over, comes and goes, stool has firmed up, had BM since leaving hospital abd feels better after BM No vomting No nausea Eating ok now - was on TPN On insulin pump/CGM - doesn't know insulin dose or last sugar  Discharge summary  Nausea and vomiting SBO Required NG tube for several days. Ng tube removed by surgery 5/29. Has since been having BMs and is tolerating advancement of diet.  Several recent hospitalizations for this problem. Ex lab 5/25 did not find adhesion or other source of mechanical SBO. Creeping fat at terminal ileum is suspicious for crohn's disease.  - adat -recieved tpn,  weaned off since pt able to tolerate po - GI consulted for IBD w/u. They advise outpatient f/u for endoluminal eval.   - PT consulted, advises [facility] PT -- patient tolerating regular diet. Discontinue TPN. Okay from surgery standpoint to discharge  PT has surgical f/up in 3 days Does not yet have GI f/up set up for IBD w/up    Current Outpatient Medications:    albuterol  (ACCUNEB) 1.25 MG/3ML nebulizer solution, Inhale 1 ampule into the lungs every 6 (six) hours as needed for wheezing or shortness of breath., Disp: , Rfl:    albuterol (PROAIR HFA) 108 (90 Base) MCG/ACT inhaler, INHALE 2 PUFFS BY MOUTH EVERY 6 HOURS AS NEEDED FOR WHEEZING OR SHORTNESS OF BREATH, Disp: 3 Inhaler, Rfl: 3   amitriptyline (ELAVIL) 25 MG tablet, Take 2-3 tablets (50-75 mg total) by mouth at bedtime., Disp: 90 tablet, Rfl: 5   baclofen (LIORESAL) 10 MG tablet, Take 1 tablet (10 mg total) by mouth 3 (three) times daily as needed for muscle spasms., Disp: 270 tablet, Rfl: 1   bisacodyl (DULCOLAX) 5 MG EC tablet, Take 1 tablet (5 mg total) by mouth daily., Disp: 30 tablet, Rfl: 0   budesonide (PULMICORT) 0.5 MG/2ML nebulizer solution, Inhale 0.5 mg into the lungs 2 (two) times daily., Disp: , Rfl:    butalbital-acetaminophen-caffeine (FIORICET) 50-325-40 MG tablet, Take 1-2 tablets by mouth every 6 (six) hours as needed for headache., Disp: 30 tablet, Rfl: 5   carvedilol (COREG) 25 MG tablet, Take 25 mg by mouth 2 (two) times daily., Disp: , Rfl:    cholecalciferol (VITAMIN D3) 25 MCG (1000 UNIT) tablet, Take 1,000 Units by mouth daily., Disp: , Rfl:    ciclopirox (PENLAC) 8 % solution, Apply topically at bedtime., Disp: , Rfl:    clopidogrel (PLAVIX) 75 MG tablet, Take 1 tablet (75 mg  total) by mouth daily., Disp: 90 tablet, Rfl: 0   Continuous Blood Gluc Sensor (FREESTYLE LIBRE 2 SENSOR) MISC, Change every 14 days, Disp: 2 each, Rfl: 3   cycloSPORINE (RESTASIS) 0.05 % ophthalmic emulsion, Place 2 drops into both eyes 2 (two) times daily., Disp: , Rfl:    Dulaglutide (TRULICITY) 4.5 WS/5.6CL SOPN, Inject 4.5 mg as directed once a week., Disp: 2 mL, Rfl: 3   DULoxetine (CYMBALTA) 60 MG capsule, Take 1 capsule (60 mg total) by mouth daily., Disp: 90 capsule, Rfl: 1   EPINEPHrine 0.3 mg/0.3 mL IJ SOAJ injection, Inject 0.3 mg into the muscle as needed for anaphylaxis., Disp: , Rfl:    ezetimibe  (ZETIA) 10 MG tablet, Take 1 tablet (10 mg total) by mouth daily., Disp: 90 tablet, Rfl: 0   fluticasone (FLONASE) 50 MCG/ACT nasal spray, Place 2 sprays into both nostrils daily., Disp: 16 g, Rfl: 3   gabapentin (NEURONTIN) 600 MG tablet, 600 mg qAM, 600 mg qPM, 1200 mg QHS, Disp: 120 tablet, Rfl: 5   hydrALAZINE (APRESOLINE) 50 MG tablet, Take 1 tablet by mouth 3 (three) times daily., Disp: , Rfl:    Insulin Disposable Pump (OMNIPOD DASH PODS, GEN 4,) MISC, Change pod once daily, Disp: 30 each, Rfl: 2   insulin lispro (HUMALOG KWIKPEN) 200 UNIT/ML KwikPen, Inject up to 160 units daily in divided doses as directed, Disp: 6 mL, Rfl: 2   Insulin Pen Needle (PEN NEEDLES) 32G X 4 MM MISC, Use with Humalog to fill pump daily, Disp: 100 each, Rfl: 2   levETIRAcetam (KEPPRA) 500 MG tablet, Take 500-750 mg by mouth See admin instructions. Take 1 tablet (529m) by mouth every morning and take 1 tablets (7540m by mouth every night, Disp: , Rfl:    meclizine (ANTIVERT) 25 MG tablet, Take 1 tablet (25 mg total) by mouth 2 (two) times daily as needed for dizziness., Disp: 30 tablet, Rfl: 0   mirabegron ER (MYRBETRIQ) 50 MG TB24 tablet, Take 1 tablet (50 mg total) by mouth daily., Disp: 90 tablet, Rfl: 1   nitroGLYCERIN (NITROSTAT) 0.4 MG SL tablet, Place 0.4 mg under the tongue every 5 (five) minutes as needed for chest pain., Disp: , Rfl:    olmesartan-hydrochlorothiazide (BENICAR HCT) 40-25 MG tablet, TAKE 1 TABLET BY MOUTH EVERY DAY, Disp: 90 tablet, Rfl: 1   ondansetron (ZOFRAN-ODT) 8 MG disintegrating tablet, Take 1 tablet (8 mg total) by mouth every 8 (eight) hours as needed for nausea or vomiting., Disp: 20 tablet, Rfl: 0   oxyCODONE-acetaminophen (PERCOCET/ROXICET) 5-325 MG tablet, Take 1 tablet by mouth every 6 (six) hours as needed for moderate pain or severe pain., Disp: 20 tablet, Rfl: 0   pantoprazole (PROTONIX) 40 MG tablet, Take 1 tablet (40 mg total) by mouth daily., Disp: 30 tablet, Rfl: 3    polyethylene glycol (MIRALAX / GLYCOLAX) 17 g packet, Take 17 g by mouth daily as needed for mild constipation., Disp: 100 each, Rfl: 3   potassium chloride SA (KLOR-CON M20) 20 MEQ tablet, You have not needed potassium while in the hospital.  Hold until outpatient followup. (Patient taking differently: Take 20 mEq by mouth 2 (two) times daily.), Disp: 60 tablet, Rfl: 5   rosuvastatin (CRESTOR) 10 MG tablet, Take 1 tablet (10 mg total) by mouth daily., Disp: 90 tablet, Rfl: 1   senna-docusate (SENOKOT-S) 8.6-50 MG tablet, Take 1 tablet by mouth 2 (two) times daily., Disp: 60 tablet, Rfl: 0   sodium phosphate (FLEET) 7-19 GM/118ML ENEM,  Place 133 mLs (1 enema total) rectally daily as needed for severe constipation., Disp: 133 mL, Rfl: 2   spironolactone (ALDACTONE) 25 MG tablet, Take 25 mg by mouth daily., Disp: , Rfl:    WIXELA INHUB 250-50 MCG/DOSE AEPB, Inhale 1 puff into the lungs 2 (two) times daily., Disp: , Rfl:   Patient Active Problem List   Diagnosis Date Noted   Diarrhea 06/09/2021   Hyperkalemia 06/09/2021   Intractable nausea and vomiting 06/08/2021   Small bowel obstruction ([place]) 05/18/2021   Orthostatic hypotension 04/29/2021   Constipation 04/28/2021   SBO (small bowel obstruction) ([place]) 04/23/2021   Hypertensive urgency 04/23/2021   GERD without esophagitis 04/23/2021   AKI (acute kidney injury) ([place]) 04/23/2021   Dyslipidemia 04/23/2021   Chronic diastolic CHF (congestive heart failure) ([place]) 04/23/2021   Cervical spondylosis 02/12/2021   Lumbar facet arthropathy 02/12/2021   Major depressive disorder, recurrent episode, moderate ([place]) 11/13/2020   Adrenal adenoma, right    PAD (peripheral artery disease) ([place]) 01/24/2020   Abnormal ankle brachial index (ABI) 12/18/2019   CAD (coronary artery disease) 04/27/2019   Diabetic polyneuropathy associated with type 2 diabetes mellitus ([place]) 03/02/2018   Vitamin D deficiency 01/24/2018   Overactive bladder 05/18/2017    Dilated aortic root (HCC)    Sacroiliac dysfunction 10/09/2014   Chronic low back pain 09/03/2014   CVA, old, hemiparesis ([place]) 09/03/2014   OSA (obstructive sleep apnea) 05/15/2014   Type 2 diabetes mellitus with chronic kidney disease, with long-term current use of insulin ([place]) 01/21/2014   Essential hypertension, benign 01/21/2014   Chronic pain syndrome 01/21/2014   Headache disorder 09/07/2013   Mixed urge and stress incontinence 08/25/2011   Ureteral stricture, right 08/25/2011   Nephrolithiasis 08/22/2011   Incisional hernia 05/10/2011   Obesity, Class III, BMI 40-49.9 (morbid obesity) ([place]) 05/10/2011   BLINDNESS, LEGAL, [place] DEFINITION 09/14/2006   Seizure ([place]) 09/13/2006    Past Surgical History:  Procedure Laterality Date   BRAIN HEMATOMA EVACUATION     five procedures total, first procedure when 60 months old, last at 56 years of age.   CYSTOSCOPY W/ RETROGRADES  09/24/2011   Procedure: CYSTOSCOPY WITH RETROGRADE PYELOGRAM;  Surgeon: Malka So, MD;  Location: [facility];  Service: Urology;  Laterality: Right;   CYSTOSCOPY WITH URETEROSCOPY  09/24/2011   Procedure: CYSTOSCOPY WITH URETEROSCOPY;  Surgeon: Malka So, MD;  Location: [facility];  Service: Urology;  Laterality: Right;  Balloon dilation right ureter    CYSTOSCOPY/RETROGRADE/URETEROSCOPY  08/24/2011   Procedure: CYSTOSCOPY/RETROGRADE/URETEROSCOPY;  Surgeon: Molli Hazard, MD;  Location: [facility];  Service: Urology;  Laterality: Right;  Cysto, Right retrograde Pyelogram, right stent placement.    INCISIONAL HERNIA REPAIR  05/05/2011   Procedure: LAPAROSCOPIC INCISIONAL HERNIA;  Surgeon: Rolm Bookbinder, MD;  Location: [place];  Service: General;  Laterality: N/A;   kidney stone removal     LAPAROTOMY N/A 06/12/2021   Procedure: EXPLORATORY LAPAROTOMY, LYSIS OF ADHESIONS;  Surgeon: Olean Ree, MD;  Location: [facility];  Service: General;  Laterality: N/A;   MASS EXCISION  05/05/2011   Procedure: EXCISION MASS;   Surgeon: Rolm Bookbinder, MD;  Location: [place];  Service: General;  Laterality: Right;   [facility]     RETINAL DETACHMENT SURGERY Left 1990   RIGHT/LEFT HEART CATH AND CORONARY ANGIOGRAPHY N/A 04/28/2019   Procedure: RIGHT/LEFT HEART CATH AND CORONARY ANGIOGRAPHY;  Surgeon: Yolonda Kida, MD;  Location: [place] [facility];  Service: Cardiovascular;  Laterality: N/A;  SHUNT REMOVAL     shunt inserted at age 89 removed at age 67    [place]    Family History  Problem Relation Age of Onset   Uterine cancer Mother    Hypertension Mother    Cancer Mother    Brain cancer Maternal Grandmother    Hypertension Maternal Grandmother    Birth defects Daughter    Hypertension Daughter    Cirrhosis Maternal Grandfather    ADD / ADHD Maternal Grandfather    Birth defects Maternal Grandfather    Diabetes Maternal Grandfather    Anesthesia problems Neg Hx    Colon cancer Neg Hx    Esophageal cancer Neg Hx    Pancreatic cancer Neg Hx     Social History   Tobacco Use   Smoking status: Former    Packs/day: 1.00    Years: 15.00    Pack years: 15.00    Types: Cigarettes    Quit date: 01/20/1987    Years since quitting: 34.4   Smokeless tobacco: Never   Tobacco comments:    quit more than 20 years  Vaping Use   Vaping Use: Never used  Substance Use Topics   Alcohol use: Yes    Alcohol/week: 1.0 standard drink    Types: 1 Glasses of wine per week   Drug use: No    Comment: hx of marijuana use no longer uses      Allergies  Allergen Reactions   Codeine Swelling and Other (See Comments)    Swelling and burning of mouth (inside)   Lactose Intolerance (Gi) Nausea And Vomiting    Health Maintenance  Topic Date Due   Zoster Vaccines- Shingrix (1 of 2) Never done   Fecal DNA (Cologuard)  Never done   MAMMOGRAM  08/12/2017   OPHTHALMOLOGY EXAM  09/13/2020   COVID-19 Vaccine (4 - Booster for Pfizer series) 06/20/2021   PAP-Cervical Cytology Screening  06/23/2021    PAP SMEAR-Modifier  06/23/2021   FOOT EXAM  10/18/2021 (Originally 09/22/2019)   Hepatitis C Screening  10/18/2021 (Originally 09/22/1982)   INFLUENZA VACCINE  08/19/2021   HEMOGLOBIN A1C  10/26/2021   TETANUS/TDAP  06/22/2028   HIV Screening  Completed   HPV VACCINES  Aged Out   COLONOSCOPY (Pts 45-38yr Insurance coverage will need to be confirmed)  Discontinued    Chart Review Today: I personally reviewed active problem list, medication list, allergies, family history, social history, health maintenance, notes from last encounter, lab results, imaging with the patient/caregiver today.   Review of Systems  Unable to perform ROS: Mental status change (pt somnulent, sweaty, not answering questions)    Objective:   Vitals:   06/23/21 1344 06/23/21 1350  BP: (!) 148/80 (!) 144/78  Pulse: 82   Resp: 16   Temp: 98.1 F (36.7 C)   TempSrc: Oral   SpO2: 95%   Weight: 239 lb 9.6 oz (108.7 kg)   Height: 5' 6.5" (1.689 m)     Body mass index is 38.09 kg/m.  Physical Exam Vitals and nursing note reviewed.  Constitutional:      Appearance: She is morbidly obese. She is ill-appearing and diaphoretic. She is not toxic-appearing.     Comments: Pt sweaty, sleepy, responds to voice, not answering questions appropriately   HENT:     Head: Normocephalic and atraumatic.     Right Ear: External ear normal.     Left Ear: External ear normal.  Eyes:     General:  Right eye: No discharge.        Left eye: No discharge.     Conjunctiva/sclera: Conjunctivae normal.  Cardiovascular:     Rate and Rhythm: Normal rate and regular rhythm.     Pulses: Normal pulses.     Heart sounds: Normal heart sounds.  Pulmonary:     Effort: Pulmonary effort is normal.     Breath sounds: Normal breath sounds.  Abdominal:     Tenderness: There is abdominal tenderness. There is no guarding or rebound.  Skin:    Coloration: Skin is not jaundiced or pale.     Findings: No bruising or erythema.      Comments: Midline abd incision intact, clean, diffuse diaphoresis - some sweating near incision, no bandage or dressing, under abd binder, some mild erythema near staples No dehiscense  No purulent discharge, no induration  Neurological:     Mental Status: She is lethargic.     Motor: Weakness present.     Comments: Oriented to person, cannot tell me date of where she is          Assessment & Plan:   Problem List Items Addressed This Visit       Genitourinary   AKI (acute kidney injury) ([place])   Relevant Orders   COMPLETE METABOLIC PANEL WITH GFR   Other Visit Diagnoses     Altered mental status, unspecified altered mental status type    -  Primary   Oriented to person only unable to answer questions, slurred, confused, sleepy and diaphoretic   Hypoglycemia       46 at 227, patient given juice in clinic and crackers rechecked and only improved slightly, still altered and diaphoretic, insulin pump on arm, pt to ED    Relevant Orders   POCT Glucose (CBG) (Completed)   POCT Glucose (CBG) (Completed)   Encounter for examination following treatment at hospital       Hospital follow-up was done with thorough review of hospital notes, discharge summary, labs, med list   Relevant Orders   CBC with Differential/Platelet   COMPLETE METABOLIC PANEL WITH GFR   Lipase   Urinalysis w microscopic + reflex cultur   Anemia, unspecified type       Patient's baseline H&H seems to go up and down likely from hemodilution when she is inpatient and on fluids   Relevant Orders   CBC with Differential/Platelet   Transaminitis       Last labs done in the hospital showed new transaminitis -I read through the latter progress note prior to discharge summary did not see anything noted   Relevant Orders   COMPLETE METABOLIC PANEL WITH GFR   Lipase   Hypokalemia       Patient on potassium supplement which was held during hospitalization for high potassium, last labs showed low potassium, recheck and  adjust dose   Relevant Orders   COMPLETE METABOLIC PANEL WITH GFR   Recurrent UTI       Screen urine   Relevant Orders   Urinalysis w microscopic + reflex cultur       PT with AMS - diaphoresis, slurred speech, oriented to person, found to be hypoglycemic Attempted to improve with apple juice and crackers CBG was 46 improved only to 62 with apple juice, she still is not A&O - sent out via EMS for AMS hypoglycemia    No follow-ups on file.   Delsa Grana, [name] 06/23/21 2:11 PM

## 2021-06-23 NOTE — ED Triage Notes (Signed)
[facility] reports pt coming from doctors office for altered mental status. Pt CBG at office was 54m/dl. They gave OJ and came up to 661mdl. EMS rechecked and CBG was 4543ml. Doctors office also states pt passed out for a second.

## 2021-06-23 NOTE — Patient Instructions (Signed)
Continue to hold you potassium until you get your lab results and I contact you with what dose to take

## 2021-06-23 NOTE — Discharge Instructions (Signed)
Your lab tests in the ED today were all reassuring.  It appears that your blood sugar was low today due to decreased food intake while taking the same insulin amounts.  The insulin needs to be balanced to the amount of food you eat to avoid blood sugar levels that are too high or too low.  Decrease your HumaLog KwikPen dosing to half-as-much until you can follow up with your endocrinologist.  It is much less dangerous for your blood sugar to run higher than normal, so we want to make sure you don't have any more episodes of low blood sugar.

## 2021-06-23 NOTE — Telephone Encounter (Signed)
Pt's hospital follow up appt completed 06/23/21

## 2021-06-23 NOTE — Progress Notes (Signed)
I called and spoke with the patient's daughter today shortly before 3 PM and explained to her that her blood sugar was low and she was being taken to [facility] emergency department via EMS. Patient's daughter Mrs. Marvel Plan states that she was not sure if her mother's blood sugar monitors are accurate and she states the alarms have been going off a lot, this morning she had home health PT come by the house and then afterwards she has been sleepy all morning.  She did not know she had a follow-up appointment today until transportation called and she got up and came.  She has not been checking her blood sugar with fingersticks or glucometers.  I explained freestyle libre 2 meter to Ms. Marvel Plan -explained that the alarm will go off if it disconnects from the phone or if blood sugars are low but all abnormal highs or lows need to be confirmed and verified with a fingerstick capillary blood glucose.  Delsa Grana, [name] 06/23/21 1600

## 2021-06-23 NOTE — Telephone Encounter (Signed)
Erika From [employer] is asking if you would still like to for her to get PT/OT as needed  Call back Arabi for confirmation. [phone]

## 2021-06-23 NOTE — ED Notes (Signed)
Pt given 8oz of OJ while attempting IV access.

## 2021-06-23 NOTE — ED Notes (Signed)
Pt daughter called and updated on pt status, advised will call back with further updates.

## 2021-06-24 NOTE — Telephone Encounter (Signed)
Spoke to [place] and relayed [employer].

## 2021-06-26 ENCOUNTER — Encounter: Payer: Medicare Other | Admitting: Physician Assistant

## 2021-06-27 NOTE — Telephone Encounter (Signed)
No answer left vm to call back office.

## 2021-06-27 NOTE — Telephone Encounter (Signed)
Copied from [place] [id number]. Topic: Quick Communication - Home Health Verbal Orders >> Jun 27, 2021  1:26 PM Rudene Anda wrote: Caller/Agency: [facility]  Callback Number:  [phone] Requesting: PT Frequency: 1x1, 2x4, 1x4

## 2021-06-28 LAB — CULTURE, BLOOD (ROUTINE X 2): Culture: NO GROWTH

## 2021-06-30 ENCOUNTER — Telehealth: Payer: Self-pay | Admitting: Endocrinology

## 2021-06-30 DIAGNOSIS — E1165 Type 2 diabetes mellitus with hyperglycemia: Secondary | ICD-10-CM

## 2021-06-30 NOTE — Telephone Encounter (Signed)
Called pt to inform her based on [name] message and pt stated she will call to make an appointment if needed. Pt stated BP today was 148/84.

## 2021-06-30 NOTE — Telephone Encounter (Signed)
Spoke to [employer] from [employer] gave VO from [place].  Elder Cyphers stated pt BP was elevated on 06/24/21, readings were 194/106.

## 2021-06-30 NOTE — Telephone Encounter (Signed)
Per patient call the Rx for her Omnipod needs to be updated to reflect that she uses per pod per day not one pod per every two days.  The patients pharmacy of choice is [facility] [place], [place] [address]

## 2021-07-01 ENCOUNTER — Encounter: Payer: Medicare Other | Admitting: Student in an Organized Health Care Education/Training Program

## 2021-07-01 ENCOUNTER — Ambulatory Visit (INDEPENDENT_AMBULATORY_CARE_PROVIDER_SITE_OTHER): Payer: Medicare Other | Admitting: Family Medicine

## 2021-07-01 ENCOUNTER — Telehealth: Payer: Self-pay | Admitting: Family Medicine

## 2021-07-01 ENCOUNTER — Encounter: Payer: Self-pay | Admitting: Family Medicine

## 2021-07-01 VITALS — BP 132/70 | HR 90 | Temp 98.5°F | Resp 16 | Ht 66.5 in | Wt 248.3 lb

## 2021-07-01 DIAGNOSIS — Z09 Encounter for follow-up examination after completed treatment for conditions other than malignant neoplasm: Secondary | ICD-10-CM

## 2021-07-01 DIAGNOSIS — Z794 Long term (current) use of insulin: Secondary | ICD-10-CM | POA: Diagnosis not present

## 2021-07-01 DIAGNOSIS — E119 Type 2 diabetes mellitus without complications: Secondary | ICD-10-CM | POA: Diagnosis not present

## 2021-07-01 DIAGNOSIS — E162 Hypoglycemia, unspecified: Secondary | ICD-10-CM

## 2021-07-01 MED ORDER — GVOKE HYPOPEN 2-PACK 1 MG/0.2ML ~~LOC~~ SOAJ: 1.0000 mg | Freq: Once | SUBCUTANEOUS | 2 refills | Status: DC | PRN

## 2021-07-01 MED ORDER — OMNIPOD DASH PODS (GEN 4) MISC: 2 refills | Status: DC

## 2021-07-01 NOTE — Progress Notes (Signed)
Patient ID: [name], female    DOB: [birth year], 56 y.o.   MRN: [id number]  PCP: Delsa Grana, [name]  Chief Complaint  Patient presents with   Hospitalization Follow-up    Pt arrived at the hospital and they checked blood sugar again it was 80. Pt was sent home. Pt states has been dealing with back and stomach pain.    Subjective:   [name] is a 56 y.o. female, presents to clinic with CC of the following:  HPI  F/up for hypoglycemia  89 this am, sugars went up to 145 Still having low blood sugar episodes - reviewed libre meter charts - over the past several days she is having hypoglycemia most often (several episodes) between midnight and 9 am Omnipod is on arm and managed by Kumar-endocrinology On GLP-1 and not on basal insulin  She's been checking CBGs more with Elenor Legato shows she is low and sometimes the cbg is lower and sometimes higher   ER visit - pt's blood sugars were in the 50s and then her lab draw showed in the 30s she was given dextrose and glucagon and after roughly 5 hours her blood sugar came up to the 80s and she went home with her daughter  She has not followed up with endocrinology  She fills up her OmniPod once a day but she cannot tell me about the settings      Patient Active Problem List   Diagnosis Date Noted   Diarrhea 06/09/2021   Hyperkalemia 06/09/2021   Intractable nausea and vomiting 06/08/2021   Small bowel obstruction ([place]) 05/18/2021   Orthostatic hypotension 04/29/2021   Constipation 04/28/2021   SBO (small bowel obstruction) ([place]) 04/23/2021   Hypertensive urgency 04/23/2021   GERD without esophagitis 04/23/2021   AKI (acute kidney injury) ([place]) 04/23/2021   Dyslipidemia 04/23/2021   Chronic diastolic CHF (congestive heart failure) ([place]) 04/23/2021   Cervical spondylosis 02/12/2021   Lumbar facet arthropathy 02/12/2021   Major depressive disorder, recurrent episode, moderate ([place]) 11/13/2020   Adrenal adenoma,  right    PAD (peripheral artery disease) ([place]) 01/24/2020   Abnormal ankle brachial index (ABI) 12/18/2019   CAD (coronary artery disease) 04/27/2019   Diabetic polyneuropathy associated with type 2 diabetes mellitus ([place]) 03/02/2018   Vitamin D deficiency 01/24/2018   Overactive bladder 05/18/2017   Dilated aortic root ([place])    Sacroiliac dysfunction 10/09/2014   Chronic low back pain 09/03/2014   CVA, old, hemiparesis ([place]) 09/03/2014   OSA (obstructive sleep apnea) 05/15/2014   Type 2 diabetes mellitus with chronic kidney disease, with long-term current use of insulin ([place]) 01/21/2014   Essential hypertension, benign 01/21/2014   Chronic pain syndrome 01/21/2014   Headache disorder 09/07/2013   Mixed urge and stress incontinence 08/25/2011   Ureteral stricture, right 08/25/2011   Nephrolithiasis 08/22/2011   Incisional hernia 05/10/2011   Obesity, Class III, BMI 40-49.9 (morbid obesity) ([place]) 05/10/2011   BLINDNESS, LEGAL, [place] DEFINITION 09/14/2006   Seizure ([place]) 09/13/2006      Current Outpatient Medications:    albuterol (ACCUNEB) 1.25 MG/3ML nebulizer solution, Inhale 1 ampule into the lungs every 6 (six) hours as needed for wheezing or shortness of breath., Disp: , Rfl:    albuterol (PROAIR HFA) 108 (90 Base) MCG/ACT inhaler, INHALE 2 PUFFS BY MOUTH EVERY 6 HOURS AS NEEDED FOR WHEEZING OR SHORTNESS OF BREATH, Disp: 3 Inhaler, Rfl: 3   amitriptyline (ELAVIL) 25 MG tablet, Take 2-3 tablets (50-75 mg total) by mouth  at bedtime., Disp: 90 tablet, Rfl: 5   baclofen (LIORESAL) 10 MG tablet, Take 1 tablet (10 mg total) by mouth 3 (three) times daily as needed for muscle spasms., Disp: 270 tablet, Rfl: 1   bisacodyl (DULCOLAX) 5 MG EC tablet, Take 1 tablet (5 mg total) by mouth daily., Disp: 30 tablet, Rfl: 0   budesonide (PULMICORT) 0.5 MG/2ML nebulizer solution, Inhale 0.5 mg into the lungs 2 (two) times daily., Disp: , Rfl:    butalbital-acetaminophen-caffeine (FIORICET)  50-325-40 MG tablet, Take 1-2 tablets by mouth every 6 (six) hours as needed for headache., Disp: 30 tablet, Rfl: 5   carvedilol (COREG) 25 MG tablet, Take 25 mg by mouth 2 (two) times daily., Disp: , Rfl:    cholecalciferol (VITAMIN D3) 25 MCG (1000 UNIT) tablet, Take 1,000 Units by mouth daily., Disp: , Rfl:    ciclopirox (PENLAC) 8 % solution, Apply topically at bedtime., Disp: , Rfl:    clopidogrel (PLAVIX) 75 MG tablet, Take 1 tablet (75 mg total) by mouth daily., Disp: 90 tablet, Rfl: 0   Continuous Blood Gluc Sensor (FREESTYLE LIBRE 2 SENSOR) MISC, Change every 14 days, Disp: 2 each, Rfl: 3   cycloSPORINE (RESTASIS) 0.05 % ophthalmic emulsion, Place 2 drops into both eyes 2 (two) times daily., Disp: , Rfl:    Dulaglutide (TRULICITY) 4.5 ZJ/6.9CV SOPN, Inject 4.5 mg as directed once a week., Disp: 2 mL, Rfl: 3   DULoxetine (CYMBALTA) 60 MG capsule, Take 1 capsule (60 mg total) by mouth daily., Disp: 90 capsule, Rfl: 1   EPINEPHrine 0.3 mg/0.3 mL IJ SOAJ injection, Inject 0.3 mg into the muscle as needed for anaphylaxis., Disp: , Rfl:    ezetimibe (ZETIA) 10 MG tablet, Take 1 tablet (10 mg total) by mouth daily., Disp: 90 tablet, Rfl: 0   fluticasone (FLONASE) 50 MCG/ACT nasal spray, Place 2 sprays into both nostrils daily., Disp: 16 g, Rfl: 3   gabapentin (NEURONTIN) 600 MG tablet, 600 mg qAM, 600 mg qPM, 1200 mg QHS, Disp: 120 tablet, Rfl: 5   hydrALAZINE (APRESOLINE) 50 MG tablet, Take 1 tablet by mouth 3 (three) times daily., Disp: , Rfl:    Insulin Disposable Pump (OMNIPOD DASH PODS, GEN 4,) MISC, Change pod once daily, Disp: 30 each, Rfl: 2   insulin lispro (HUMALOG KWIKPEN) 200 UNIT/ML KwikPen, Inject up to 160 units daily in divided doses as directed, Disp: 6 mL, Rfl: 2   Insulin Pen Needle (PEN NEEDLES) 32G X 4 MM MISC, Use with Humalog to fill pump daily, Disp: 100 each, Rfl: 2   levETIRAcetam (KEPPRA) 500 MG tablet, Take 500-750 mg by mouth See admin instructions. Take 1 tablet  (514m) by mouth every morning and take 1 tablets (7551m by mouth every night, Disp: , Rfl:    meclizine (ANTIVERT) 25 MG tablet, Take 1 tablet (25 mg total) by mouth 2 (two) times daily as needed for dizziness., Disp: 30 tablet, Rfl: 0   mirabegron ER (MYRBETRIQ) 50 MG TB24 tablet, Take 1 tablet (50 mg total) by mouth daily., Disp: 90 tablet, Rfl: 1   nitroGLYCERIN (NITROSTAT) 0.4 MG SL tablet, Place 0.4 mg under the tongue every 5 (five) minutes as needed for chest pain., Disp: , Rfl:    olmesartan-hydrochlorothiazide (BENICAR HCT) 40-25 MG tablet, TAKE 1 TABLET BY MOUTH EVERY DAY, Disp: 90 tablet, Rfl: 1   ondansetron (ZOFRAN-ODT) 8 MG disintegrating tablet, Take 1 tablet (8 mg total) by mouth every 8 (eight) hours as needed for nausea or vomiting.,  Disp: 20 tablet, Rfl: 0   oxyCODONE-acetaminophen (PERCOCET/ROXICET) 5-325 MG tablet, Take 1 tablet by mouth every 6 (six) hours as needed for moderate pain or severe pain., Disp: 20 tablet, Rfl: 0   pantoprazole (PROTONIX) 40 MG tablet, Take 1 tablet (40 mg total) by mouth daily., Disp: 30 tablet, Rfl: 3   polyethylene glycol (MIRALAX / GLYCOLAX) 17 g packet, Take 17 g by mouth daily as needed for mild constipation., Disp: 100 each, Rfl: 3   potassium chloride SA (KLOR-CON M20) 20 MEQ tablet, You have not needed potassium while in the hospital.  Hold until outpatient followup. (Patient taking differently: Take 20 mEq by mouth 2 (two) times daily.), Disp: 60 tablet, Rfl: 5   rosuvastatin (CRESTOR) 10 MG tablet, Take 1 tablet (10 mg total) by mouth daily., Disp: 90 tablet, Rfl: 1   senna-docusate (SENOKOT-S) 8.6-50 MG tablet, Take 1 tablet by mouth 2 (two) times daily., Disp: 60 tablet, Rfl: 0   sodium phosphate (FLEET) 7-19 GM/118ML ENEM, Place 133 mLs (1 enema total) rectally daily as needed for severe constipation., Disp: 133 mL, Rfl: 2   spironolactone (ALDACTONE) 25 MG tablet, Take 25 mg by mouth daily., Disp: , Rfl:    WIXELA INHUB 250-50 MCG/DOSE  AEPB, Inhale 1 puff into the lungs 2 (two) times daily., Disp: , Rfl:    Allergies  Allergen Reactions   Codeine Swelling and Other (See Comments)    Swelling and burning of mouth (inside)   Lactose Intolerance (Gi) Nausea And Vomiting     Social History   Tobacco Use   Smoking status: Former    Packs/day: 1.00    Years: 15.00    Total pack years: 15.00    Types: Cigarettes    Quit date: 01/20/1987    Years since quitting: 34.4   Smokeless tobacco: Never   Tobacco comments:    quit more than 20 years  Vaping Use   Vaping Use: Never used  Substance Use Topics   Alcohol use: Yes    Alcohol/week: 1.0 standard drink of alcohol    Types: 1 Glasses of wine per week   Drug use: No    Comment: hx of marijuana use no longer uses       Chart Review Today: I personally reviewed active problem list, medication list, allergies, family history, social history, health maintenance, notes from last encounter, lab results, imaging with the patient/caregiver today.   Review of Systems  Constitutional: Negative.   HENT: Negative.    Eyes: Negative.   Respiratory: Negative.    Cardiovascular: Negative.   Gastrointestinal: Negative.   Endocrine: Negative.   Genitourinary: Negative.   Musculoskeletal: Negative.   Skin: Negative.   Allergic/Immunologic: Negative.   Neurological: Negative.   Hematological: Negative.   Psychiatric/Behavioral: Negative.    All other systems reviewed and are negative.      Objective:   Vitals:   07/01/21 1010  BP: 132/70  Pulse: 90  Resp: 16  Temp: 98.5 F (36.9 C)  TempSrc: Oral  SpO2: 96%  Weight: 248 lb 4.8 oz (112.6 kg)  Height: 5' 6.5" (1.689 m)    Body mass index is 39.48 kg/m.  Physical Exam Vitals reviewed.  Constitutional:      General: She is not in acute distress.    Appearance: She is obese. She is not ill-appearing, toxic-appearing or diaphoretic.  HENT:     Head: Normocephalic and atraumatic.     Right Ear: External  ear normal.  Left Ear: External ear normal.  Cardiovascular:     Pulses: Normal pulses.     Heart sounds: Normal heart sounds.  Pulmonary:     Effort: Pulmonary effort is normal.     Breath sounds: Normal breath sounds.  Abdominal:     General: Bowel sounds are normal.     Palpations: Abdomen is soft.  Neurological:     Mental Status: She is alert. Mental status is at baseline.  Psychiatric:        Behavior: Behavior normal.      Results for orders placed or performed during the hospital encounter of 06/23/21  Culture, blood (routine x 2)   Specimen: BLOOD  Result Value Ref Range   Specimen Description BLOOD BLOOD RIGHT ARM    Special Requests      BOTTLES DRAWN AEROBIC AND ANAEROBIC Blood Culture results may not be optimal due to an excessive volume of blood received in culture bottles   Culture      NO GROWTH 5 DAYS Performed at [facility], [address] [place], [place] [address]    Report Status 06/28/2021 FINAL   Comprehensive metabolic panel  Result Value Ref Range   Sodium 138 135 - 145 mmol/L   Potassium 3.7 3.5 - 5.1 mmol/L   Chloride 103 98 - 111 mmol/L   CO2 29 22 - 32 mmol/L   Glucose, Bld 33 (LL) 70 - 99 mg/dL   BUN 17 6 - 20 mg/dL   Creatinine, Ser 0.88 0.44 - 1.00 mg/dL   Calcium 8.9 8.9 - 10.3 mg/dL   Total Protein 7.3 6.5 - 8.1 g/dL   Albumin 3.4 (L) 3.5 - 5.0 g/dL   AST 18 15 - 41 U/L   ALT 95 (H) 0 - 44 U/L   Alkaline Phosphatase 130 (H) 38 - 126 U/L   Total Bilirubin 0.5 0.3 - 1.2 mg/dL   GFR, Estimated >60 >60 mL/min   Anion gap 6 5 - 15  Lipase, blood  Result Value Ref Range   Lipase 34 11 - 51 U/L  Lactic acid, plasma  Result Value Ref Range   Lactic Acid, Venous 1.2 0.5 - 1.9 mmol/L  CBC with Differential  Result Value Ref Range   WBC 9.8 4.0 - 10.5 K/uL   RBC 3.72 (L) 3.87 - 5.11 MIL/uL   Hemoglobin 10.6 (L) 12.0 - 15.0 g/dL   HCT 34.4 (L) 36.0 - 46.0 %   MCV 92.5 80.0 - 100.0 fL   MCH 28.5 26.0 - 34.0 pg   MCHC  30.8 30.0 - 36.0 g/dL   RDW 14.6 11.5 - 15.5 %   Platelets 410 (H) 150 - 400 K/uL   nRBC 0.0 0.0 - 0.2 %   Neutrophils Relative % 71 %   Neutro Abs 7.0 1.7 - 7.7 K/uL   Lymphocytes Relative 21 %   Lymphs Abs 2.1 0.7 - 4.0 K/uL   Monocytes Relative 7 %   Monocytes Absolute 0.7 0.1 - 1.0 K/uL   Eosinophils Relative 0 %   Eosinophils Absolute 0.0 0.0 - 0.5 K/uL   Basophils Relative 0 %   Basophils Absolute 0.0 0.0 - 0.1 K/uL   Immature Granulocytes 1 %   Abs Immature Granulocytes 0.07 0.00 - 0.07 K/uL  CBG monitoring, ED  Result Value Ref Range   Glucose-Capillary 51 (L) 70 - 99 mg/dL  CBG monitoring, ED  Result Value Ref Range   Glucose-Capillary 78 70 - 99 mg/dL  CBG monitoring, ED  Result Value Ref Range   Glucose-Capillary 70 70 - 99 mg/dL  POC CBG, ED  Result Value Ref Range   Glucose-Capillary 85 70 - 99 mg/dL   *Note: Due to a large number of results and/or encounters for the requested time period, some results have not been displayed. A complete set of results can be found in Results Review.       Assessment & Plan:   1. Insulin dependent type 2 diabetes mellitus ([place]) Managed by endocrinology Strongly encouraged her to call and get her follow-up appointment release call to inquire about her OmniPod settings to decrease her insulin doses so that she does not have hypoglycemia in the evenings and prior to eating breakfast in the morning - GVOKE HYPOPEN 2-PACK 1 MG/0.2ML SOAJ; Inject 1 mg into the skin once as needed for up to 1 dose (for hypoglycemia secondary to insulin).  Dispense: 0.4 mL; Refill: 2  2. Hypoglycemia  - GVOKE HYPOPEN 2-PACK 1 MG/0.2ML SOAJ; Inject 1 mg into the skin once as needed for up to 1 dose (for hypoglycemia secondary to insulin).  Dispense: 0.4 mL; Refill: 2  3. Encounter for examination following treatment at hospital ER encounter, notes and lab results reviewed     Delsa Grana, [name] 07/01/21 10:28 AM

## 2021-07-01 NOTE — Telephone Encounter (Signed)
Rx sent to pharmacy   

## 2021-07-01 NOTE — Telephone Encounter (Signed)
Copied from [place] [id number]. Topic: Quick Communication - Home Health Verbal Orders >> Jul 01, 2021 11:58 AM Everette C wrote: Caller/Agency: Sherlynn Stalls / [place] Number:  [phone]  Sherlynn Stalls has called to share that the patient has been evaluated today 07/01/21 but will not be needing OT services at this time  Sherlynn Stalls was directed, by the patient, to call and request that the patient be contacted to schedule an appointment for their left wrist though   Please contact further when possible

## 2021-07-01 NOTE — Patient Instructions (Addendum)
Follow up with Karen Snare, MD (Endocrinology) in 2 days (06/25/2021)  Call right away and make sure they help you with the omnipod settings so that you do not have low blood sugar episodesh  Preventing Hypoglycemia Hypoglycemia occurs when the level of sugar (glucose) in the blood is too low. Hypoglycemia can happen in people who do or do not have diabetes (diabetes mellitus). It can develop quickly, and it can be a medical emergency. For most people with diabetes, a blood glucose level below 70 mg/dL (3.9 mmol/L) is considered hypoglycemia. Glucose is a type of sugar that provides the body's main source of energy. Certain hormones (insulin and glucagon) control the level of glucose in the blood. Insulin lowers blood glucose, and glucagon increases blood glucose. Hypoglycemia can result from having too much insulin in the bloodstream, or from not eating enough food that contains glucose. Your risk for hypoglycemia is higher: If you take insulin or diabetes medicines to help lower your blood glucose or to help your body make more insulin. If you skip or delay a meal or snack. If you are ill. During and after exercise. You can prevent hypoglycemia by working with your health care provider to adjust your meal plan as needed and by taking other precautions. How can hypoglycemia affect me? Mild symptoms Mild hypoglycemia may not cause any symptoms. If you do have symptoms, they may include: Hunger. Sweating and feeling clammy. Dizziness or feeling light-headed. Sleepiness or restless sleep. Nausea. Increased heart rate. Headache. Blurry vision. Mood changes, including irritability or anxiety. Tingling or numbness around the mouth, lips, or tongue. If mild hypoglycemia is not recognized and treated, it can quickly become moderate or severe hypoglycemia. Moderate symptoms Moderate hypoglycemia can cause: Confusion and poor judgment. Behavior changes. Weakness. Irregular heartbeat. A change  in coordination. Severe symptoms Severe hypoglycemia is a medical emergency. It can cause: Fainting. Seizures. Loss of consciousness (coma). Death. What nutrition changes can be made? Work with your health care provider or dietitian to make a healthy meal plan that is right for you. Follow your meal plan carefully. Eat meals at regular times. If recommended by your health care provider, have snacks between meals. Donot skip or delay meals or snacks. You can be at risk for hypoglycemia if you are not getting enough carbohydrates. What lifestyle changes can be made?  Work closely with your health care provider to manage your blood glucose. Make sure you know: Your goal blood glucose levels. How and when to check your blood glucose. The symptoms of hypoglycemia. It is important to treat hypoglycemia right away to keep it from becoming severe. Do not drink alcohol on an empty stomach. When you are ill, check your blood glucose more often than usual. Make a sick day plan in advance with your health care provider. Follow this plan whenever you cannot eat or drink normally. Always check your blood glucose before, during, and after exercise. How is this treated? This condition can often be treated by immediately eating or drinking something that contains sugar with 15 grams of fast-acting carbohydrate, such as: 4 oz (120 mL) of fruit juice. 4 oz (120 mL) of regular soda (not diet soda). Several pieces of hard candy. Check food labels to find out how many pieces to eat for 15 grams. 1 Tbsp (15 mL) of sugar or honey. 4 glucose tablets. 1 tube of glucose gel. Treating hypoglycemia if you have diabetes If you are alert and able to swallow safely, follow the 15:15 rule: Take  15 grams of a fast-acting carbohydrate. Talk with your health care provider about how much you should take. Fast-acting options include: Glucose tablets (take 4 tablets). Several pieces of hard candy. Check food labels to  find out how many pieces to eat for 15 grams. 4 oz (120 mL) of fruit juice. 4 oz (120 mL) of regular soda (not diet soda). 1 Tbsp (15 mL) of sugar or honey. 1 tube of glucose gel. Check your blood glucose 15 minutes after you take the carbohydrate. If the repeat blood glucose level is still at or below 70 mg/dL (3.9 mmol/L), take 15 grams of a carbohydrate again. If your blood glucose level does not increase above 70 mg/dL (3.9 mmol/L) after 3 tries, seek emergency medical care. After your blood glucose level returns to normal, eat a meal or a snack within 1 hour. Treating severe hypoglycemia Severe hypoglycemia is when your blood glucose level is below 54 mg/dL (3 mmol/L). Severe hypoglycemia is a medical emergency. Get medical help right away. If you have severe hypoglycemia and you cannot eat or drink, you may need glucagon. A family member or close friend should learn how to check your blood glucose and how to give you glucagon. Ask your health care provider if you need to have an emergency glucagon kit available. Severe hypoglycemia may need to be treated in a hospital. The treatment may include getting glucose through an IV. You may also need treatment for the cause of your hypoglycemia. Where to find more information American Diabetes Association: www.diabetes.[employer] of Diabetes and Digestive and Kidney Diseases: [employer] Association of Diabetes Care & Education Specialists: www.diabeteseducator.org Contact a health care provider if: You have problems keeping your blood glucose in your target range. You have frequent episodes of hypoglycemia. Get help right away if: You continue to have hypoglycemia symptoms after eating or drinking something containing glucose. Your blood glucose level is below 54 mg/dL (3 mmol/L). You faint. You have a seizure. These symptoms may represent a serious problem that is an emergency. Do not wait to see if the symptoms will go  away. Get medical help right away. Call your local emergency services (911 in the [place] Do not drive yourself to the hospital. Summary Know the symptoms of hypoglycemia and when you are at risk for it, such as during exercise or when you are sick. Check your blood glucose often when you are at risk for hypoglycemia. Hypoglycemia can develop quickly, and it can be dangerous if it is not treated right away. If you have a history of severe hypoglycemia, make sure your family or a close friend knows how to use your glucagon kit. Make sure you know how to treat hypoglycemia. Keep a fast-acting carbohydrate option available when you may be at risk for hypoglycemia. This information is not intended to replace advice given to you by your health care provider. Make sure you discuss any questions you have with your health care provider. Document Revised: 12/07/2019 Document Reviewed: 12/07/2019 Elsevier Patient Education  [place] blood sugar before each meal 3 times a day  Take 2 units of NovoLog if your sugar is 150-200 Take 4 units of NovoLog if your sugar is 201-250 Take 6 units of NovoLog if your sugar is 251-300 Take 8 units of NovoLog if your sugar is 301-350 Take 10 units of NovoLog if your sugar is 351-400 Take 12 units of NovoLog if your sugar is over 401

## 2021-07-02 NOTE — Telephone Encounter (Signed)
Lvm for pt to call and schedule an appt for her wrist if its still needed

## 2021-07-03 ENCOUNTER — Ambulatory Visit (INDEPENDENT_AMBULATORY_CARE_PROVIDER_SITE_OTHER): Payer: Medicare Other | Admitting: Physician Assistant

## 2021-07-03 ENCOUNTER — Encounter: Payer: Self-pay | Admitting: Physician Assistant

## 2021-07-03 ENCOUNTER — Encounter: Payer: Medicare Other | Admitting: Student in an Organized Health Care Education/Training Program

## 2021-07-03 VITALS — BP 120/65 | HR 74 | Temp 98.0°F | Ht 66.5 in | Wt 257.6 lb

## 2021-07-03 DIAGNOSIS — R1084 Generalized abdominal pain: Secondary | ICD-10-CM

## 2021-07-03 DIAGNOSIS — Z09 Encounter for follow-up examination after completed treatment for conditions other than malignant neoplasm: Secondary | ICD-10-CM

## 2021-07-03 DIAGNOSIS — Z8719 Personal history of other diseases of the digestive system: Secondary | ICD-10-CM

## 2021-07-03 NOTE — Progress Notes (Signed)
[facility] POST-OP OFFICE VISIT  07/03/2021  HPI: [name] is a 56 y.o. female 21 days s/p exploratory laparotomy and lysis of adhesions for SBO incidentally found to have creeping fat intra-operatively concerning for possible IBD with Dr Hampton Abbot  She is overall doing okay She reports her biggest issue remains fatigue Reporting a decreased appetite but notes she had good meals, high in protein, yesterday Abdomen is sore, but this is improving daily No fever, chills, nausea, or emesis reported Having bowel function; + flatus; stools are looser (no diarrhea) Incision is healing well; she is anxious to get staples out Wearing abdominal binder No other complaints   Vital signs: BP 120/65   Pulse 74   Temp 98 F (36.7 C)   Ht 5' 6.5" (1.689 m)   Wt 257 lb 9.6 oz (116.8 kg)   LMP  (LMP Unknown)   SpO2 97%   BMI 40.95 kg/m    Physical Exam: Constitutional: Well appearing female, NAD Abdomen: Soft, incisional soreness with staple removal otherwise non-tender, non-distended, no rebound/guarding. Abdominal binder present Skin: Laparotomy is CDI with staples, this is well healed, no erythema or drainage   Assessment/Plan: This is a 56 y.o. female 21 days s/p exploratory laparotomy and lysis of adhesions for SBO incidentally found to have creeping fat intra-operatively concerning for possible IBD   - Pain control prn  - Encouraged her that some level of fatigue given all she has been through is expected. Continue high protein diet, nutritional supplementation, and to be as active as possible.   - Staples removed; wound healing well. Steri-strips placed. Reviewed wound care recommendations  - Reviewed lifting restrictions; 6 weeks total; continue abdominal binder as needed  - She will rtc in 2-3 weeks for recheck  -- Edison Simon, [name] [facility] 07/03/2021, 2:13 PM M-F: 7am - 4pm

## 2021-07-03 NOTE — Patient Instructions (Addendum)
You may use the abdominal binder as much as you like. Eat a high protein diet to help with healing.  You may shower with the steri strips, just do not scrub at them. They will come off in 1-2 weeks Follow up here in 2 weeks.  GENERAL POST-OPERATIVE PATIENT INSTRUCTIONS   WOUND CARE INSTRUCTIONS: Try to keep the wound dry and avoid ointments on the wound unless directed to do so.  If the wound becomes bright red and painful or starts to drain infected material that is not clear, please contact your physician immediately.  If the wound is mildly pink and has a thick firm ridge underneath it, this is normal, and is referred to as a healing ridge.  This will resolve over the next 4-6 weeks.  BATHING: You may shower if you have been informed of this by your surgeon. However, Please do not submerge in a tub, hot tub, or pool until incisions are completely sealed or have been told by your surgeon that you may do so.  DIET:  You may eat any foods that you can tolerate.  It is a good idea to eat a high fiber diet and take in plenty of fluids to prevent constipation.  If you do become constipated you may want to take a mild laxative or take ducolax tablets on a daily basis until your bowel habits are regular.  Constipation can be very uncomfortable, along with straining, after recent surgery.  ACTIVITY:  You are encouraged to cough and deep breath or use your incentive spirometer if you were given one, every 15-30 minutes when awake.  This will help prevent respiratory complications and low grade fevers post-operatively if you had a general anesthetic.  You may want to hug a pillow when coughing and sneezing to add additional support to the surgical area, if you had abdominal or chest surgery, which will decrease pain during these times.  You are encouraged to walk and engage in light activity for the next two weeks.  You should not lift more than 20 pounds for 6 weeks total after surgery as it could put you at  increased risk for complications.  Twenty pounds is roughly equivalent to a plastic bag of groceries. At that time- Listen to your body when lifting, if you have pain when lifting, stop and then try again in a few days. Soreness after doing exercises or activities of daily living is normal as you get back in to your normal routine.  MEDICATIONS:  Try to take narcotic medications and anti-inflammatory medications, such as tylenol, ibuprofen, naprosyn, etc., with food.  This will minimize stomach upset from the medication.  Should you develop nausea and vomiting from the pain medication, or develop a rash, please discontinue the medication and contact your physician.  You should not drive, make important decisions, or operate machinery when taking narcotic pain medication.  SUNBLOCK Use sun block to incision area over the next year if this area will be exposed to sun. This helps decrease scarring and will allow you avoid a permanent darkened area over your incision.  QUESTIONS:  Please feel free to call our office if you have any questions, and we will be glad to assist you. [phone]

## 2021-07-07 ENCOUNTER — Encounter: Payer: Self-pay | Admitting: Family Medicine

## 2021-07-07 ENCOUNTER — Emergency Department: Payer: Medicare Other

## 2021-07-07 ENCOUNTER — Inpatient Hospital Stay
Admission: EM | Admit: 2021-07-07 | Discharge: 2021-07-09 | DRG: 920 | Disposition: A | Discharge status: Home / Self Care | Payer: Medicare Other | Attending: Obstetrics and Gynecology | Admitting: Obstetrics and Gynecology

## 2021-07-07 ENCOUNTER — Observation Stay: Payer: Medicare Other

## 2021-07-07 ENCOUNTER — Encounter: Payer: Self-pay | Admitting: Emergency Medicine

## 2021-07-07 ENCOUNTER — Other Ambulatory Visit: Payer: Self-pay

## 2021-07-07 DIAGNOSIS — R55 Syncope and collapse: Secondary | ICD-10-CM

## 2021-07-07 DIAGNOSIS — K219 Gastro-esophageal reflux disease without esophagitis: Secondary | ICD-10-CM | POA: Diagnosis present

## 2021-07-07 DIAGNOSIS — E1140 Type 2 diabetes mellitus with diabetic neuropathy, unspecified: Secondary | ICD-10-CM | POA: Diagnosis present

## 2021-07-07 DIAGNOSIS — N3000 Acute cystitis without hematuria: Secondary | ICD-10-CM | POA: Diagnosis present

## 2021-07-07 DIAGNOSIS — I69354 Hemiplegia and hemiparesis following cerebral infarction affecting left non-dominant side: Secondary | ICD-10-CM

## 2021-07-07 DIAGNOSIS — Z7985 Long-term (current) use of injectable non-insulin antidiabetic drugs: Secondary | ICD-10-CM

## 2021-07-07 DIAGNOSIS — Z8673 Personal history of transient ischemic attack (TIA), and cerebral infarction without residual deficits: Secondary | ICD-10-CM

## 2021-07-07 DIAGNOSIS — Z87440 Personal history of urinary (tract) infections: Secondary | ICD-10-CM

## 2021-07-07 DIAGNOSIS — I739 Peripheral vascular disease, unspecified: Secondary | ICD-10-CM | POA: Diagnosis present

## 2021-07-07 DIAGNOSIS — E1165 Type 2 diabetes mellitus with hyperglycemia: Secondary | ICD-10-CM | POA: Diagnosis not present

## 2021-07-07 DIAGNOSIS — I2510 Atherosclerotic heart disease of native coronary artery without angina pectoris: Secondary | ICD-10-CM | POA: Diagnosis present

## 2021-07-07 DIAGNOSIS — Z79899 Other long term (current) drug therapy: Secondary | ICD-10-CM

## 2021-07-07 DIAGNOSIS — M1990 Unspecified osteoarthritis, unspecified site: Secondary | ICD-10-CM | POA: Diagnosis present

## 2021-07-07 DIAGNOSIS — Z833 Family history of diabetes mellitus: Secondary | ICD-10-CM

## 2021-07-07 DIAGNOSIS — E1151 Type 2 diabetes mellitus with diabetic peripheral angiopathy without gangrene: Secondary | ICD-10-CM | POA: Diagnosis present

## 2021-07-07 DIAGNOSIS — N182 Chronic kidney disease, stage 2 (mild): Secondary | ICD-10-CM | POA: Diagnosis present

## 2021-07-07 DIAGNOSIS — I129 Hypertensive chronic kidney disease with stage 1 through stage 4 chronic kidney disease, or unspecified chronic kidney disease: Secondary | ICD-10-CM | POA: Diagnosis present

## 2021-07-07 DIAGNOSIS — E785 Hyperlipidemia, unspecified: Secondary | ICD-10-CM | POA: Diagnosis present

## 2021-07-07 DIAGNOSIS — Z6841 Body Mass Index (BMI) 40.0 and over, adult: Secondary | ICD-10-CM

## 2021-07-07 DIAGNOSIS — Z90710 Acquired absence of both cervix and uterus: Secondary | ICD-10-CM

## 2021-07-07 DIAGNOSIS — B9620 Unspecified Escherichia coli [E. coli] as the cause of diseases classified elsewhere: Secondary | ICD-10-CM | POA: Diagnosis present

## 2021-07-07 DIAGNOSIS — Z87891 Personal history of nicotine dependence: Secondary | ICD-10-CM

## 2021-07-07 DIAGNOSIS — E66813 Obesity, class 3: Secondary | ICD-10-CM | POA: Diagnosis present

## 2021-07-07 DIAGNOSIS — I69359 Hemiplegia and hemiparesis following cerebral infarction affecting unspecified side: Secondary | ICD-10-CM

## 2021-07-07 DIAGNOSIS — Z7951 Long term (current) use of inhaled steroids: Secondary | ICD-10-CM

## 2021-07-07 DIAGNOSIS — G4733 Obstructive sleep apnea (adult) (pediatric): Secondary | ICD-10-CM | POA: Diagnosis present

## 2021-07-07 DIAGNOSIS — R569 Unspecified convulsions: Secondary | ICD-10-CM

## 2021-07-07 DIAGNOSIS — Z91011 Allergy to milk products: Secondary | ICD-10-CM

## 2021-07-07 DIAGNOSIS — Z8249 Family history of ischemic heart disease and other diseases of the circulatory system: Secondary | ICD-10-CM

## 2021-07-07 DIAGNOSIS — K56609 Unspecified intestinal obstruction, unspecified as to partial versus complete obstruction: Secondary | ICD-10-CM

## 2021-07-07 DIAGNOSIS — G40A09 Absence epileptic syndrome, not intractable, without status epilepticus: Secondary | ICD-10-CM

## 2021-07-07 DIAGNOSIS — I10 Essential (primary) hypertension: Secondary | ICD-10-CM | POA: Diagnosis present

## 2021-07-07 DIAGNOSIS — T85614A Breakdown (mechanical) of insulin pump, initial encounter: Secondary | ICD-10-CM | POA: Diagnosis not present

## 2021-07-07 DIAGNOSIS — H548 Legal blindness, as defined in USA: Secondary | ICD-10-CM | POA: Diagnosis present

## 2021-07-07 DIAGNOSIS — E162 Hypoglycemia, unspecified: Secondary | ICD-10-CM | POA: Diagnosis not present

## 2021-07-07 DIAGNOSIS — F330 Major depressive disorder, recurrent, mild: Secondary | ICD-10-CM | POA: Diagnosis present

## 2021-07-07 DIAGNOSIS — Z9641 Presence of insulin pump (external) (internal): Secondary | ICD-10-CM | POA: Diagnosis present

## 2021-07-07 DIAGNOSIS — Z7902 Long term (current) use of antithrombotics/antiplatelets: Secondary | ICD-10-CM

## 2021-07-07 DIAGNOSIS — Y742 Prosthetic and other implants, materials and accessory general hospital and personal-use devices associated with adverse incidents: Secondary | ICD-10-CM | POA: Diagnosis present

## 2021-07-07 DIAGNOSIS — E11319 Type 2 diabetes mellitus with unspecified diabetic retinopathy without macular edema: Secondary | ICD-10-CM | POA: Diagnosis present

## 2021-07-07 DIAGNOSIS — E1169 Type 2 diabetes mellitus with other specified complication: Secondary | ICD-10-CM

## 2021-07-07 DIAGNOSIS — E1122 Type 2 diabetes mellitus with diabetic chronic kidney disease: Secondary | ICD-10-CM | POA: Diagnosis present

## 2021-07-07 DIAGNOSIS — I252 Old myocardial infarction: Secondary | ICD-10-CM

## 2021-07-07 DIAGNOSIS — J449 Chronic obstructive pulmonary disease, unspecified: Secondary | ICD-10-CM | POA: Diagnosis present

## 2021-07-07 DIAGNOSIS — F331 Major depressive disorder, recurrent, moderate: Secondary | ICD-10-CM | POA: Diagnosis present

## 2021-07-07 DIAGNOSIS — N390 Urinary tract infection, site not specified: Secondary | ICD-10-CM | POA: Diagnosis present

## 2021-07-07 DIAGNOSIS — E11649 Type 2 diabetes mellitus with hypoglycemia without coma: Secondary | ICD-10-CM | POA: Diagnosis present

## 2021-07-07 DIAGNOSIS — Z794 Long term (current) use of insulin: Secondary | ICD-10-CM

## 2021-07-07 DIAGNOSIS — Z885 Allergy status to narcotic agent status: Secondary | ICD-10-CM

## 2021-07-07 HISTORY — DX: Syncope and collapse: R55

## 2021-07-07 LAB — CBC
HCT: 36.4 % (ref 36.0–46.0)
Hemoglobin: 10.9 g/dL — ABNORMAL LOW (ref 12.0–15.0)
MCH: 28.5 pg (ref 26.0–34.0)
MCHC: 29.9 g/dL — ABNORMAL LOW (ref 30.0–36.0)
MCV: 95.3 fL (ref 80.0–100.0)
Platelets: 264 10*3/uL (ref 150–400)
RBC: 3.82 MIL/uL — ABNORMAL LOW (ref 3.87–5.11)
RDW: 15.2 % (ref 11.5–15.5)
WBC: 7.1 10*3/uL (ref 4.0–10.5)
nRBC: 0 % (ref 0.0–0.2)

## 2021-07-07 LAB — COMPREHENSIVE METABOLIC PANEL
ALT: 18 U/L (ref 0–44)
AST: 11 U/L — ABNORMAL LOW (ref 15–41)
Albumin: 3.4 g/dL — ABNORMAL LOW (ref 3.5–5.0)
Alkaline Phosphatase: 96 U/L (ref 38–126)
Anion gap: 4 — ABNORMAL LOW (ref 5–15)
BUN: 17 mg/dL (ref 6–20)
CO2: 29 mmol/L (ref 22–32)
Calcium: 8.7 mg/dL — ABNORMAL LOW (ref 8.9–10.3)
Chloride: 108 mmol/L (ref 98–111)
Creatinine, Ser: 0.89 mg/dL (ref 0.44–1.00)
GFR, Estimated: >60 mL/min (ref >60)
Glucose, Bld: 54 mg/dL — ABNORMAL LOW (ref 70–99)
Potassium: 3.5 mmol/L (ref 3.5–5.1)
Sodium: 141 mmol/L (ref 135–145)
Total Bilirubin: 0.6 mg/dL (ref 0.3–1.2)
Total Protein: 7.2 g/dL (ref 6.5–8.1)

## 2021-07-07 LAB — URINALYSIS, ROUTINE W REFLEX MICROSCOPIC
Bilirubin Urine: NEGATIVE
Glucose, UA: >=500 mg/dL — AB
Ketones, ur: NEGATIVE mg/dL
Nitrite: POSITIVE — AB
Protein, ur: NEGATIVE mg/dL
Specific Gravity, Urine: 1.02 (ref 1.005–1.030)
WBC, UA: >50 WBC/hpf — ABNORMAL HIGH (ref 0–5)
pH: 5 (ref 5.0–8.0)

## 2021-07-07 LAB — CBG MONITORING, ED
Glucose-Capillary: 48 mg/dL — ABNORMAL LOW (ref 70–99)
Glucose-Capillary: 66 mg/dL — ABNORMAL LOW (ref 70–99)
Glucose-Capillary: 69 mg/dL — ABNORMAL LOW (ref 70–99)
Glucose-Capillary: 70 mg/dL (ref 70–99)
Glucose-Capillary: 64 mg/dL — ABNORMAL LOW (ref 70–99)
Glucose-Capillary: 108 mg/dL — ABNORMAL HIGH (ref 70–99)
Glucose-Capillary: 152 mg/dL — ABNORMAL HIGH (ref 70–99)
Glucose-Capillary: 159 mg/dL — ABNORMAL HIGH (ref 70–99)
Glucose-Capillary: 181 mg/dL — ABNORMAL HIGH (ref 70–99)

## 2021-07-07 LAB — LIPASE, BLOOD: Lipase: 37 U/L (ref 11–51)

## 2021-07-07 LAB — TSH: TSH: 0.772 u[IU]/mL (ref 0.350–4.500)

## 2021-07-07 LAB — PHOSPHORUS: Phosphorus: 4.7 mg/dL — ABNORMAL HIGH (ref 2.5–4.6)

## 2021-07-07 LAB — MAGNESIUM: Magnesium: 2.4 mg/dL (ref 1.7–2.4)

## 2021-07-07 LAB — TROPONIN I (HIGH SENSITIVITY): Troponin I (High Sensitivity): 8 ng/L (ref <18)

## 2021-07-07 LAB — BRAIN NATRIURETIC PEPTIDE: B Natriuretic Peptide: 17.1 pg/mL (ref 0.0–100.0)

## 2021-07-07 MED ORDER — PANTOPRAZOLE SODIUM 40 MG PO TBEC
40.0000 mg | DELAYED_RELEASE_TABLET | Freq: Every day | ORAL | Status: DC
  Administered 2021-07-07 – 2021-07-09 (×3): 40 mg via ORAL
  Filled 2021-07-07 (×3): qty 1

## 2021-07-07 MED ORDER — NITROGLYCERIN 0.4 MG SL SUBL
0.4000 mg | SUBLINGUAL_TABLET | SUBLINGUAL | Status: DC | PRN
  Administered 2021-07-08: 0.4 mg via SUBLINGUAL
  Filled 2021-07-07: qty 1

## 2021-07-07 MED ORDER — AMITRIPTYLINE HCL 25 MG PO TABS
50.0000 mg | ORAL_TABLET | Freq: Every day | ORAL | Status: DC
  Administered 2021-07-07 – 2021-07-08 (×2): 50 mg via ORAL
  Filled 2021-07-07: qty 2
  Filled 2021-07-07: qty 1

## 2021-07-07 MED ORDER — HYDROCHLOROTHIAZIDE 25 MG PO TABS
25.0000 mg | ORAL_TABLET | Freq: Every day | ORAL | Status: DC
Start: 2021-07-08 — End: 2021-07-09
  Administered 2021-07-08 – 2021-07-09 (×2): 25 mg via ORAL
  Filled 2021-07-07 (×2): qty 1

## 2021-07-07 MED ORDER — ACETAMINOPHEN 650 MG RE SUPP: 650.0000 mg | Freq: Four times a day (QID) | RECTAL | Status: DC | PRN

## 2021-07-07 MED ORDER — INSULIN ASPART 100 UNIT/ML IJ SOLN
0.0000 [IU] | Freq: Three times a day (TID) | INTRAMUSCULAR | Status: DC
  Administered 2021-07-08: 3 [IU] via SUBCUTANEOUS
  Administered 2021-07-08: 5 [IU] via SUBCUTANEOUS
  Administered 2021-07-08: 3 [IU] via SUBCUTANEOUS
  Administered 2021-07-09: 5 [IU] via SUBCUTANEOUS
  Filled 2021-07-07 (×4): qty 1

## 2021-07-07 MED ORDER — LEVETIRACETAM 500 MG PO TABS
500.0000 mg | ORAL_TABLET | Freq: Every morning | ORAL | Status: DC
  Administered 2021-07-08 – 2021-07-09 (×2): 500 mg via ORAL
  Filled 2021-07-07 (×2): qty 1

## 2021-07-07 MED ORDER — CLOPIDOGREL BISULFATE 75 MG PO TABS
75.0000 mg | ORAL_TABLET | Freq: Every day | ORAL | Status: DC
  Administered 2021-07-07 – 2021-07-09 (×3): 75 mg via ORAL
  Filled 2021-07-07 (×3): qty 1

## 2021-07-07 MED ORDER — OXYCODONE-ACETAMINOPHEN 5-325 MG PO TABS
1.0000 | ORAL_TABLET | Freq: Four times a day (QID) | ORAL | Status: DC | PRN
  Administered 2021-07-08 (×3): 1 via ORAL
  Filled 2021-07-07 (×3): qty 1

## 2021-07-07 MED ORDER — POLYETHYLENE GLYCOL 3350 17 G PO PACK: 17.0000 g | PACK | Freq: Two times a day (BID) | ORAL | Status: DC | PRN

## 2021-07-07 MED ORDER — GABAPENTIN 600 MG PO TABS
1200.0000 mg | ORAL_TABLET | Freq: Every day | ORAL | Status: DC
  Administered 2021-07-07 – 2021-07-08 (×2): 1200 mg via ORAL
  Filled 2021-07-07 (×2): qty 2

## 2021-07-07 MED ORDER — ONDANSETRON HCL 4 MG/2ML IJ SOLN
4.0000 mg | Freq: Once | INTRAMUSCULAR | Status: AC
  Administered 2021-07-07: 4 mg via INTRAVENOUS
  Filled 2021-07-07: qty 2

## 2021-07-07 MED ORDER — ONDANSETRON HCL 4 MG PO TABS: 4.0000 mg | ORAL_TABLET | Freq: Four times a day (QID) | ORAL | Status: DC | PRN

## 2021-07-07 MED ORDER — ONDANSETRON HCL 4 MG/2ML IJ SOLN: 4.0000 mg | Freq: Four times a day (QID) | INTRAMUSCULAR | Status: DC | PRN

## 2021-07-07 MED ORDER — INSULIN ASPART 100 UNIT/ML IJ SOLN: 0.0000 [IU] | Freq: Every day | INTRAMUSCULAR | Status: DC

## 2021-07-07 MED ORDER — IOHEXOL 300 MG/ML  SOLN
100.0000 mL | Freq: Once | INTRAMUSCULAR | Status: AC | PRN
  Administered 2021-07-07: 100 mL via INTRAVENOUS

## 2021-07-07 MED ORDER — DULOXETINE HCL 30 MG PO CPEP
60.0000 mg | ORAL_CAPSULE | Freq: Every day | ORAL | Status: DC
  Administered 2021-07-08 – 2021-07-09 (×2): 60 mg via ORAL
  Filled 2021-07-07: qty 1
  Filled 2021-07-07: qty 2

## 2021-07-07 MED ORDER — GABAPENTIN 600 MG PO TABS
600.0000 mg | ORAL_TABLET | Freq: Two times a day (BID) | ORAL | Status: DC
  Administered 2021-07-08 – 2021-07-09 (×3): 600 mg via ORAL
  Filled 2021-07-07 (×3): qty 1

## 2021-07-07 MED ORDER — SODIUM CHLORIDE 0.9 % IV SOLN
1.0000 g | Freq: Once | INTRAVENOUS | Status: AC
  Administered 2021-07-07: 1 g via INTRAVENOUS
  Filled 2021-07-07: qty 10

## 2021-07-07 MED ORDER — OLMESARTAN MEDOXOMIL-HCTZ 40-25 MG PO TABS: 1.0000 | ORAL_TABLET | Freq: Every day | ORAL | Status: DC

## 2021-07-07 MED ORDER — MIRABEGRON ER 50 MG PO TB24
50.0000 mg | ORAL_TABLET | Freq: Every day | ORAL | Status: DC
  Administered 2021-07-07 – 2021-07-09 (×3): 50 mg via ORAL
  Filled 2021-07-07 (×3): qty 1

## 2021-07-07 MED ORDER — DEXTROSE-NACL 10-0.45 % IV SOLN
INTRAVENOUS | Status: DC
  Filled 2021-07-07 (×6): qty 1000

## 2021-07-07 MED ORDER — FLUTICASONE PROPIONATE 50 MCG/ACT NA SUSP
2.0000 | Freq: Every day | NASAL | Status: DC
  Administered 2021-07-07 – 2021-07-09 (×3): 2 via NASAL
  Filled 2021-07-07: qty 16

## 2021-07-07 MED ORDER — BUTALBITAL-APAP-CAFFEINE 50-325-40 MG PO TABS: 1.0000 | ORAL_TABLET | Freq: Four times a day (QID) | ORAL | Status: DC | PRN

## 2021-07-07 MED ORDER — CYCLOSPORINE 0.05 % OP EMUL
2.0000 [drp] | Freq: Two times a day (BID) | OPHTHALMIC | Status: DC
  Administered 2021-07-07 – 2021-07-09 (×4): 2 [drp] via OPHTHALMIC
  Filled 2021-07-07 (×4): qty 30

## 2021-07-07 MED ORDER — DEXTROSE 10 % IV SOLN: INTRAVENOUS | Status: DC

## 2021-07-07 MED ORDER — DEXTROSE 50 % IV SOLN
INTRAVENOUS | Status: AC
  Filled 2021-07-07: qty 50

## 2021-07-07 MED ORDER — ROSUVASTATIN CALCIUM 10 MG PO TABS
10.0000 mg | ORAL_TABLET | Freq: Every day | ORAL | Status: DC
  Administered 2021-07-08 – 2021-07-09 (×2): 10 mg via ORAL
  Filled 2021-07-07 (×3): qty 1

## 2021-07-07 MED ORDER — LORAZEPAM 2 MG/ML IJ SOLN
2.0000 mg | INTRAMUSCULAR | Status: DC | PRN
Start: 2021-07-07 — End: 2021-07-09

## 2021-07-07 MED ORDER — CARVEDILOL 25 MG PO TABS
25.0000 mg | ORAL_TABLET | Freq: Two times a day (BID) | ORAL | Status: DC
  Administered 2021-07-07 – 2021-07-09 (×4): 25 mg via ORAL
  Filled 2021-07-07: qty 4
  Filled 2021-07-07 (×3): qty 1

## 2021-07-07 MED ORDER — IRBESARTAN 150 MG PO TABS
300.0000 mg | ORAL_TABLET | Freq: Every day | ORAL | Status: DC
  Administered 2021-07-08 – 2021-07-09 (×2): 300 mg via ORAL
  Filled 2021-07-07 (×2): qty 2

## 2021-07-07 MED ORDER — METOCLOPRAMIDE HCL 5 MG/ML IJ SOLN
5.0000 mg | Freq: Four times a day (QID) | INTRAMUSCULAR | Status: DC | PRN
Start: 2021-07-07 — End: 2021-07-09

## 2021-07-07 MED ORDER — ENOXAPARIN SODIUM 40 MG/0.4ML IJ SOSY
40.0000 mg | PREFILLED_SYRINGE | Freq: Every day | INTRAMUSCULAR | Status: DC
  Administered 2021-07-08: 40 mg via SUBCUTANEOUS
  Filled 2021-07-07: qty 0.4

## 2021-07-07 MED ORDER — SODIUM CHLORIDE 0.9 % IV SOLN
1.0000 g | INTRAVENOUS | Status: DC
  Administered 2021-07-08 – 2021-07-09 (×2): 1 g via INTRAVENOUS
  Filled 2021-07-07: qty 1
  Filled 2021-07-07: qty 10

## 2021-07-07 MED ORDER — SENNOSIDES-DOCUSATE SODIUM 8.6-50 MG PO TABS: 1.0000 | ORAL_TABLET | Freq: Every evening | ORAL | Status: DC | PRN

## 2021-07-07 MED ORDER — HYDRALAZINE HCL 20 MG/ML IJ SOLN: 5.0000 mg | Freq: Four times a day (QID) | INTRAMUSCULAR | Status: DC | PRN

## 2021-07-07 MED ORDER — HYDRALAZINE HCL 50 MG PO TABS
50.0000 mg | ORAL_TABLET | Freq: Three times a day (TID) | ORAL | Status: DC
  Administered 2021-07-07 – 2021-07-09 (×5): 50 mg via ORAL
  Filled 2021-07-07 (×5): qty 1

## 2021-07-07 MED ORDER — MOMETASONE FURO-FORMOTEROL FUM 200-5 MCG/ACT IN AERO
2.0000 | INHALATION_SPRAY | Freq: Two times a day (BID) | RESPIRATORY_TRACT | Status: DC
  Administered 2021-07-07 – 2021-07-09 (×4): 2 via RESPIRATORY_TRACT
  Filled 2021-07-07: qty 8.8

## 2021-07-07 MED ORDER — LORAZEPAM 0.5 MG PO TABS: 0.5000 mg | ORAL_TABLET | Freq: Four times a day (QID) | ORAL | Status: DC | PRN

## 2021-07-07 MED ORDER — ACETAMINOPHEN 325 MG PO TABS: 650.0000 mg | ORAL_TABLET | Freq: Four times a day (QID) | ORAL | Status: DC | PRN

## 2021-07-07 MED ORDER — CICLOPIROX 8 % EX SOLN: Freq: Every day | CUTANEOUS | Status: DC

## 2021-07-07 MED ORDER — EZETIMIBE 10 MG PO TABS
10.0000 mg | ORAL_TABLET | Freq: Every day | ORAL | Status: DC
  Administered 2021-07-08 – 2021-07-09 (×2): 10 mg via ORAL
  Filled 2021-07-07 (×2): qty 1

## 2021-07-07 MED ORDER — SPIRONOLACTONE 25 MG PO TABS
25.0000 mg | ORAL_TABLET | Freq: Every day | ORAL | Status: DC
  Administered 2021-07-07 – 2021-07-09 (×3): 25 mg via ORAL
  Filled 2021-07-07 (×3): qty 1

## 2021-07-07 MED ORDER — MECLIZINE HCL 25 MG PO TABS
25.0000 mg | ORAL_TABLET | Freq: Two times a day (BID) | ORAL | Status: DC | PRN
  Administered 2021-07-08: 25 mg via ORAL
  Filled 2021-07-07: qty 1

## 2021-07-07 MED ORDER — LEVETIRACETAM 250 MG PO TABS: 250.0000 mg | ORAL_TABLET | ORAL | Status: DC

## 2021-07-07 MED ORDER — GABAPENTIN 600 MG PO TABS: 600.0000 mg | ORAL_TABLET | ORAL | Status: DC

## 2021-07-07 MED ORDER — FLEET ENEMA 7-19 GM/118ML RE ENEM: 1.0000 | ENEMA | Freq: Every day | RECTAL | Status: DC | PRN

## 2021-07-07 MED ORDER — LEVETIRACETAM 750 MG PO TABS
750.0000 mg | ORAL_TABLET | Freq: Every day | ORAL | Status: DC
  Administered 2021-07-07 – 2021-07-08 (×2): 750 mg via ORAL
  Filled 2021-07-07 (×2): qty 1

## 2021-07-07 NOTE — Assessment & Plan Note (Addendum)
Patient reports history of recurrent UTIs.  She had finished oral antibiotics after prior admission about 1 month ago.  Presented with dysuria and UA grossly positive for infection --Continue Rocephin --Follow urine culture

## 2021-07-07 NOTE — H&P (Signed)
History and Physical   [name] WUJ:[id number] DOB: 12-18-1965 DOA: 07/07/2021  PCP: Delsa Grana, [name]  Outpatient Specialists: Dr. Hampton Abbot Patient coming from: Home via EMS  I have personally briefly reviewed patient's old medical records in [place].  Chief Concern: Hypoglycemia and syncope  HPI: [name] is a 56 year old female with history of insulin-dependent diabetes mellitus, history of small bowel obstruction secondary to adhesions, episodes of hypoglycemia, hypertension, GERD, hyperlipidemia, constipation, history of CVA with hemiparesis, obstructive sleep apnea, morbid obesity, blindness, history of seizures, OSA, who presents emergency department for chief concerns of hypoglycemia.  Initial vitals in the emergency department showed temperature 98.5, respiration rate of 16, heart rate 62, blood pressure 135/90, SPO2 100% on room air.  Serum sodium 141, potassium 3.5, chloride 108, bicarb 29, BUN is 17, serum creatinine 0.89, GFR 60, nonfasting blood glucose 54, WBC 7.1, hemoglobin 10.9, platelets of 264.  ED treatment: Dextrose 2 A, Zofran 4 mg IV, ceftriaxone 1 g IV, dextrose gtt.  At bedside patient is awake alert and oriented to his self, age, she knows she is in the hospital.  She provided the full HPI.  Patient reports that since Saturday she has been having episodes of hypoglycemia.  Of note, her sugar was 75, she had a banana pudding and recheck the blood glucose 45 minutes later and it was in the 50s.  She then had macaroni and cheese, bread, a popsicle which did help her sugar minimally.  She reports she had her usual chest discomfort that was relieved with nitroglycerin on Saturday as she was having episodes of hypoglycemia.  The episodes of hypoglycemia continued into Sunday.  She was found to have passed out Monday morning.  Patient and family did not know how long she was out.  She reports that over the last few weeks, her insulin pump  has not been working appropriately.  She is called the manufacturer and they keep resending her different components however it did not 6-lead malfunction.  In addition she endorses dysuria for 2 days.  She reports she is not sexually active.  Social history: She lives at home with her daughter and grandchildren.  She denies tobacco, EtOH, recreational drug use.  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  ROS: Constitutional: no weight change, no fever ENT/Mouth: no sore throat, no rhinorrhea Eyes: no eye pain, no vision changes Cardiovascular: no chest pain, no dyspnea,  no edema, no palpitations Respiratory: no cough, no sputum, no wheezing Gastrointestinal: no nausea, no vomiting, no diarrhea, no constipation Genitourinary: no urinary incontinence, no dysuria, no hematuria Musculoskeletal: no arthralgias, no myalgias Skin: no skin lesions, no pruritus, Neuro: + weakness, no loss of consciousness, no syncope Psych: no anxiety, no depression, + decrease appetite Heme/Lymph: no bruising, no bleeding  ED Course: Discussed with emergency medicine provider, patient requiring hospitalization for chief concerns of hypoglycemia.  Assessment/Plan  Principal Problem:   Hypoglycemia Active Problems:   Syncope   Seizure (HCC)   OSA (obstructive sleep apnea)   Essential hypertension, benign   Type 2 diabetes mellitus with chronic kidney disease, with long-term current use of insulin (HCC)   Obesity, Class III, BMI 40-49.9 (morbid obesity) (HCC)   CAD (coronary artery disease)   Major depressive disorder, recurrent episode, moderate (HCC)   Dyslipidemia   BLINDNESS, LEGAL, [place] DEFINITION   CVA, old, hemiparesis ([place])   PAD (peripheral artery disease) (HCC)   UTI (urinary tract infection)   Assessment and Plan:  *  Hypoglycemia - Etiology work-up in progress at this time, differentials include user error/understanding of insulin pump versus acute cystitis - Check TSH,  phosphorus, magnesium on admission - Check portable chest x-ray - Continue dextrose gtt. - CBG monitoring every 2 hours for 10 hours and then transition to 3 times daily AC for 07/08/2021 - Insulin SSI with at bedtime coverage ordered - Goal inpatient blood glucose level is 140-180 - Admit to progressive cardiac unit, observation  Syncope - Etiology work-up in progress, suspect secondary to hypoglycemia - However as patient endorses chest pain on 07/05/2021, I have checked a high sensitive troponin, BNP - Given history of seizure, I have also ordered an EEG - CT of the head without contrast has been ordered  Seizure ([place]) - Resumed home Keppra 500 mg every morning and 750 mg nightly - Ativan 2 mg IV as needed for seizure, 2 doses ordered, with instructions to administer as appropriate and then let provider know - EEG ordered - Seizure precautions  OSA (obstructive sleep apnea) - In-house CPAP machine has been ordered, patient declined stating that she brought her own and she will use her own - Home CPAP nightly  Essential hypertension, benign - Carvedilol 25 mg p.o. twice daily, hydralazine 50 mg 3 times daily, Benicar 40-25 mg daily, spironolactone 25 mg daily resumed - Hydralazine 5 mg IV every 6 hours as needed for SBP greater than 170  Type 2 diabetes mellitus with chronic kidney disease, with long-term current use of insulin ([place]) - Patient is on insulin pump at home, this has been discontinued at this time - Diabetic educator has been consulted and she states the patient that she is working with a Dr. Dwyane Dee who will come by to see her and address patient's nonfunctioning insulin pump - I have not resumed home Trulicity - Insulin SSI with at bedtime coverage ordered - Goal inpatient blood glucose level is 140-180  Obesity, Class III, BMI 40-49.9 (morbid obesity) ([place]) - Patient has BMI of 41.56 and multiple comorbidities including hypertension, insulin-dependent diabetes  mellitus, hyperlipidemia, history of CVA, history of seizures, OSA. This complicates overall care and prognosis.  Dyslipidemia - Rosuvastatin 10 mg nightly resumed  UTI (urinary tract infection) - Present on admission, status post ceftriaxone 1 g IV per EDP - UA was positive for nitrates and leukocytes - I resumed ceftriaxone 1 g IV daily for 07/08/2021, 4 additional doses ordered - Urine cultures in process  AM team to complete med reconciliation  Chart reviewed.   DVT prophylaxis: Enoxaparin Code Status: Full code Diet: Heart healthy/carb modified Family Communication: A family call to update has been offered. Patient declined stating that family knows she is here and has been updated already. Disposition Plan: Pending clinical course, insulin pump issue resolved Consults called: Diabetic educator Admission status: Progressive cardiac, observation  Past Medical History:  Diagnosis Date   Anxiety and depression 09/13/2006   Qualifier: Diagnosis of  By: Hassell Done [name], Nykedtra     Arthritis    joint pain    Asthma    COPD (chronic obstructive pulmonary disease) ([place])    chronic bronchitis    Depression    Diabetes mellitus    since age 18; type 2 IDDM   Diabetic neuropathy, painful ([place])    FEET AND HANDS   Diabetic retinopathy ([place])    Diastolic dysfunction    a.  Echo 11/17: EF 60-65%, mild LVH, no RWMA, Gr1DD, mild AI, dilated aortic root measuring 38 mm, mildly dilated ascending aorta  Dilated aortic root (HCC)    3m by echo 11/2015   Dizziness    secondary to diabetes and hypertension    Epilepsy idiopathic petit mal (HCC)    last seizure 2012;controlled w/ topomax   GERD (gastroesophageal reflux disease)    Glaucoma    NOT ON ANY EYE DROPS    Headache(784.0)    migraines    Heart murmur    born with    History of stress test    a. 11/17: Normal perfusion, EF 53%, normal study   Hx of blood clots    hematomas removed from left side of brain from 160moto  [id number]    Hyperlipidemia    Hypertension    Legally blind    left eye    MIGRAINE HEADACHE 09/14/2006   Qualifier: Diagnosis of  By: MarHassell DoneP, Nykedtra     [facility]  Patient reported history of without objective documentation   Nephrolithiasis    frequent urination , urination at nite  PT SEEN IN ER 09/22/11 FOR BACK AND RT SIDED PAIN--HAS KNOWN STONE RT URETER AND UA IN ER SHOWED UTI   Pain 09/23/11   LOWER BACK AND RIGHT SIDE--PT HAS RIGHT URETERAL STONE   Pneumonia    hx of 2009   Pyelonephritis    Seizures ([place]  Sleep apnea    [facility] not [facility] ; mild   Stress incontinence    [place]roke ([place]  last 2003  RESIDUAL LEFT LEG WEAKNESS--NO OTHER RESIDUAL PROBLEMS   Past Surgical History:  Procedure Laterality Date   BRAIN HEMATOMA EVACUATION     five procedures total, first procedure when 10 45 monthsd, last at 4 y50ars of age.   CYSTOSCOPY W/ RETROGRADES  09/24/2011   Procedure: CYSTOSCOPY WITH RETROGRADE PYELOGRAM;  Surgeon: JohMalka SoD;  [facility];  Service: Urology;  Laterality: Right;   CYSTOSCOPY WITH URETEROSCOPY  09/24/2011   Procedure: CYSTOSCOPY WITH URETEROSCOPY;  Surgeon: JohMalka SoD;  [facility];  Service: Urology;  Laterality: Right;  Balloon dilation right ureter    CYSTOSCOPY/RETROGRADE/URETEROSCOPY  08/24/2011   Procedure: CYSTOSCOPY/RETROGRADE/URETEROSCOPY;  Surgeon: DanMolli HazardD;  [facility];  Service: Urology;  Laterality: Right;  Cysto, Right retrograde Pyelogram, right stent placement.    INCISIONAL HERNIA REPAIR  05/05/2011   Procedure: LAPAROSCOPIC INCISIONAL HERNIA;  Surgeon: MatRolm BookbinderD;  [facility];  Laterality: N/A;   kidney stone removal     LAPAROTOMY N/A 06/12/2021   Procedure: EXPLORATORY LAPAROTOMY, LYSIS OF ADHESIONS;  Surgeon: PisOlean ReeD;  [facility];  Service: General;  Laterality: N/A;   MASS EXCISION   05/05/2011   Procedure: EXCISION MASS;  Surgeon: MatRolm BookbinderD;  [facility];  Laterality: Right;   PC[facility]     RETINAL DETACHMENT SURGERY Left 1990   RIGHT/LEFT HEART CATH AND CORONARY ANGIOGRAPHY N/A 04/28/2019   Procedure: RIGHT/LEFT HEART CATH AND CORONARY ANGIOGRAPHY;  Surgeon: CalYolonda KidaD;  [facility];  Service: Cardiovascular;  Laterality: N/A;   SHUNT REMOVAL     shunt inserted at age 60 r63[place] age [age] [place]  reports that she quit smoking about 34 years ago. Her smoking use included cigarettes. She has a 15.00 pack-year smoking history. She has never used smokeless tobacco.  She reports current alcohol use of about 1.0 standard drink of alcohol per week. She reports that she does not use drugs.  Allergies  Allergen Reactions   Codeine Swelling and Other (See Comments)    Swelling and burning of mouth (inside)   Lactose Intolerance (Gi) Nausea And Vomiting   Family History  Problem Relation Age of Onset   Uterine cancer Mother    Hypertension Mother    Cancer Mother    Brain cancer Maternal Grandmother    Hypertension Maternal Grandmother    Birth defects Daughter    Hypertension Daughter    Cirrhosis Maternal Grandfather    ADD / ADHD Maternal Grandfather    Birth defects Maternal Grandfather    Diabetes Maternal Grandfather    Anesthesia problems Neg Hx    Colon cancer Neg Hx    Esophageal cancer Neg Hx    Pancreatic cancer Neg Hx    Family history: Family history reviewed and pertinent diabetes mellitus in maternal grandmother.  Prior to Admission medications   Medication Sig Start Date End Date Taking? Authorizing Provider  albuterol (ACCUNEB) 1.25 MG/3ML nebulizer solution Inhale 1 ampule into the lungs every 6 (six) hours as needed for wheezing or shortness of breath.    [provider]  albuterol (PROAIR HFA) 108 (90 Base) MCG/ACT inhaler INHALE 2 PUFFS BY  MOUTH EVERY 6 HOURS AS NEEDED FOR WHEEZING OR SHORTNESS OF BREATH 01/24/18   Hubbard Hartshorn, [name]  amitriptyline (ELAVIL) 25 MG tablet Take 2-3 tablets (50-75 mg total) by mouth at bedtime. 01/07/21 07/06/21  Gillis Santa, MD  baclofen (LIORESAL) 10 MG tablet Take 1 tablet (10 mg total) by mouth 3 (three) times daily as needed for muscle spasms. 06/04/21   Delsa Grana, [name]  bisacodyl (DULCOLAX) 5 MG EC tablet Take 1 tablet (5 mg total) by mouth daily. 05/24/21   Nicole Kindred A, DO  budesonide (PULMICORT) 0.5 MG/2ML nebulizer solution Inhale 0.5 mg into the lungs 2 (two) times daily.    Ottie Glazier, MD  butalbital-acetaminophen-caffeine [facility]) [phone] MG tablet Take 1-2 tablets by mouth every 6 (six) hours as needed for headache. 03/10/21 03/10/22  Gillis Santa, MD  carvedilol (COREG) 25 MG tablet Take 25 mg by mouth 2 (two) times daily. 03/06/21   [provider]  cholecalciferol (VITAMIN D3) 25 MCG (1000 UNIT) tablet Take 1,000 Units by mouth daily.    [provider]  ciclopirox (PENLAC) 8 % solution Apply topically at bedtime. 02/01/21   [provider]  clopidogrel (PLAVIX) 75 MG tablet Take 1 tablet (75 mg total) by mouth daily. 10/10/19   Towanda Malkin, MD  Continuous Blood Gluc Sensor (FREESTYLE LIBRE 2 SENSOR) MISC Change every 14 days 05/14/21   Elayne Snare, MD  cycloSPORINE (RESTASIS) 0.05 % ophthalmic emulsion Place 2 drops into both eyes 2 (two) times daily.    [provider]  Dulaglutide (TRULICITY) 4.5 EE/1.0OF SOPN Inject 4.5 mg as directed once a week. 10/22/20   Elayne Snare, MD  DULoxetine (CYMBALTA) 60 MG capsule Take 1 capsule (60 mg total) by mouth daily. 02/14/21   Bo Merino, [name]  EPINEPHrine 0.3 mg/0.3 mL IJ SOAJ injection Inject 0.3 mg into the muscle as needed for anaphylaxis. 03/29/19   [provider]  ezetimibe (ZETIA) 10 MG tablet Take 1 tablet (10 mg total) by mouth daily. 04/14/21   Bo Merino, [name]   fluticasone (FLONASE) 50 MCG/ACT nasal spray Place 2 sprays into both nostrils daily. 04/18/19  Towanda Malkin, MD  gabapentin (NEURONTIN) 600 MG tablet 600 mg qAM, 600 mg qPM, 1200 mg QHS 01/07/21   Lateef, Bilal, MD  GVOKE HYPOPEN 2-PACK 1 MG/0.2ML SOAJ Inject 1 mg into the skin once as needed for up to 1 dose (for hypoglycemia secondary to insulin). 07/01/21   Delsa Grana, [name]  hydrALAZINE (APRESOLINE) 50 MG tablet Take 1 tablet by mouth 3 (three) times daily. 01/31/21   [provider]  Insulin Disposable Pump (OMNIPOD DASH PODS, GEN 4,) MISC Change pod once daily 07/01/21   Elayne Snare, MD  insulin lispro (HUMALOG KWIKPEN) 200 UNIT/ML KwikPen Inject up to 160 units daily in divided doses as directed 03/26/21   Elayne Snare, MD  Insulin Pen Needle (PEN NEEDLES) 32G X 4 MM MISC Use with Humalog to fill pump daily 05/14/21   Elayne Snare, MD  levETIRAcetam (KEPPRA) 500 MG tablet Take 500-750 mg by mouth See admin instructions. Take 1 tablet (536m) by mouth every morning and take 1 tablets (7556m by mouth every night    [provider]  meclizine (ANTIVERT) 25 MG tablet Take 1 tablet (25 mg total) by mouth 2 (two) times daily as needed for dizziness. 05/23/21   GrEzekiel SlocumbDO  mirabegron ER (MYRBETRIQ) 50 MG TB24 tablet Take 1 tablet (50 mg total) by mouth daily. 08/20/20   RuMyles GipDO  nitroGLYCERIN (NITROSTAT) 0.4 MG SL tablet Place 0.4 mg under the tongue every 5 (five) minutes as needed for chest pain.    [provider]  olmesartan-hydrochlorothiazide (BENICAR HCT) 40-25 MG tablet TAKE 1 TABLET BY MOUTH EVERY DAY 02/21/21   TaDelsa GranaPA-C  ondansetron (ZOFRAN-ODT) 8 MG disintegrating tablet Take 1 tablet (8 mg total) by mouth every 8 (eight) hours as needed for nausea or vomiting. 05/23/21   GrEzekiel SlocumbDO  oxyCODONE-acetaminophen (PERCOCET/ROXICET) 5-325 MG tablet Take 1 tablet by mouth every 6 (six) hours as needed for moderate pain or  severe pain. 06/19/21   PaFritzi MandesMD  pantoprazole (PROTONIX) 40 MG tablet Take 1 tablet (40 mg total) by mouth daily. 06/20/21   PaFritzi MandesMD  polyethylene glycol (MIRALAX / GLYCOLAX) 17 g packet Take 17 g by mouth daily as needed for mild constipation. 06/01/19   TaDelsa GranaPA-C  potassium chloride SA (KLOR-CON M20) 20 MEQ tablet You have not needed potassium while in the hospital.  Hold until outpatient followup. Patient taking differently: Take 20 mEq by mouth 2 (two) times daily. 05/21/19   LaEnzo BiMD  rosuvastatin (CRESTOR) 10 MG tablet Take 1 tablet (10 mg total) by mouth daily. 02/14/21   PeBo MerinoFNP  senna-docusate (SENOKOT-S) 8.6-50 MG tablet Take 1 tablet by mouth 2 (two) times daily. 05/23/21   GrEzekiel SlocumbDO  sodium phosphate (FLEET) 7-19 GM/118ML ENEM Place 133 mLs (1 enema total) rectally daily as needed for severe constipation. 05/24/21   GrEzekiel SlocumbDO  spironolactone (ALDACTONE) 25 MG tablet Take 25 mg by mouth daily.    [provider]  WIGrant RutsNHUB 250-50 MCG/DOSE AEPB Inhale 1 puff into the lungs 2 (two) times daily. 01/22/20   [provider]   Physical Exam: Vitals:   07/07/21 1300 07/07/21 1530 07/07/21 1615 07/07/21 1812  BP: (!) 176/92 (!) 187/96  (!) 160/82  Pulse: 66 66 68 68  Resp:  18  17  Temp:      TempSrc:      SpO2: 100% 100% 100% 100%  Weight:      Height:       Constitutional: appears older than chronological age, NAD, calm, comfortable Eyes: PERRL, lids and conjunctivae normal ENMT: Mucous membranes are moist. Posterior pharynx clear of any exudate or lesions. Age-appropriate dentition. Hearing appropriate Neck: normal, supple, no masses, no thyromegaly Respiratory: clear to auscultation bilaterally, no wheezing, no crackles. Normal respiratory effort. No accessory muscle use.  Cardiovascular: Regular rate and rhythm, no murmurs / rubs / gallops. No extremity edema. 2+ pedal pulses. No carotid bruits.  Abdomen:  Morbidly obese abdomen, no tenderness, no masses palpated, no hepatosplenomegaly. Bowel sounds positive.  Musculoskeletal: no clubbing / cyanosis. No joint deformity upper and lower extremities. Good ROM, no contractures, no atrophy. Normal muscle tone.  Skin: no rashes, lesions, ulcers. No induration Neurologic: Sensation intact. Strength 5/5 in all 4.  Psychiatric: Normal judgment and insight. Alert and oriented x 3. Normal mood.   EKG: independently reviewed, showing sinus rhythm with rate of 61, QTc 467  Chest x-ray on Admission: I personally reviewed and I agree with radiologist reading as below.  CT HEAD WO CONTRAST (5MM)  Result Date: 07/07/2021 CLINICAL DATA:  Syncope/presyncope, cerebrovascular cause suspected EXAM: CT HEAD WITHOUT CONTRAST TECHNIQUE: Contiguous axial images were obtained from the base of the skull through the vertex without intravenous contrast. RADIATION DOSE REDUCTION: This exam was performed according to the departmental dose-optimization program which includes automated exposure control, adjustment of the mA and/or kV according to patient size and/or use of iterative reconstruction technique. COMPARISON:  12/31/2020 FINDINGS: Brain: There is no acute intracranial hemorrhage, mass effect, or edema. Gray-white differentiation is preserved. There is no extra-axial fluid collection. Ventricles and sulci are stable in size and configuration. Vascular: No hyperdense vessel or unexpected calcification. Skull: Calvarium is unremarkable. Sinuses/Orbits: No acute finding. Other: None. IMPRESSION: No acute intracranial abnormality. Electronically Signed   By: Macy Mis M.D.   On: 07/07/2021 17:36   DG Chest Port 1 View  Result Date: 07/07/2021 CLINICAL DATA:  Normal mediastinum and cardiac silhouette. Normal pulmonary vasculature. No evidence of effusion, infiltrate, or pneumothorax. No acute bony abnormality. EXAM: PORTABLE CHEST 1 VIEW COMPARISON:  None Available. FINDINGS:  Normal cardiac silhouette. There is mild central venous congestion. Improvement in the pulmonary edema pattern seen on prior. No pneumothorax. No pleural fluid. IMPRESSION: Improvement in pulmonary edema pattern seen on prior. Mild venous congestion remains. Electronically Signed   By: Suzy Bouchard M.D.   On: 07/07/2021 16:42   CT ABDOMEN PELVIS W CONTRAST  Result Date: 07/07/2021 CLINICAL DATA:  Lower abdominal pain and UTI. Concern for small bowel obstruction and pyelonephritis. EXAM: CT ABDOMEN AND PELVIS WITH CONTRAST TECHNIQUE: Multidetector CT imaging of the abdomen and pelvis was performed using the standard protocol following bolus administration of intravenous contrast. RADIATION DOSE REDUCTION: This exam was performed according to the departmental dose-optimization program which includes automated exposure control, adjustment of the mA and/or kV according to patient size and/or use of iterative reconstruction technique. CONTRAST:  133m OMNIPAQUE IOHEXOL 300 MG/ML  SOLN COMPARISON:  Multiple priors including most recent CT Jun 08, 2021 FINDINGS: Lower chest: Scarring versus atelectasis in the bilateral lung base. Patulous esophagus. Hepatobiliary: No suspicious hepatic lesion. Gallbladder is unremarkable. No biliary ductal dilation. Pancreas: No pancreatic ductal dilation or evidence of acute inflammation. Spleen: Normal in size without focal abnormality. Adrenals/Urinary Tract: 2.4 cm right adrenal adenoma is stable dating back to May 12, 2011 consistent with a benign finding requiring no independent imaging follow-up. Left  adrenal gland appears normal. No hydronephrosis. Bilateral nonobstructive renal stones measure up to 5 mm. No obstructive ureteral or bladder calculi identified. Right-greater-than-left renal cortical scarring. Kidneys demonstrate symmetric enhancement and excretion of contrast material. Urinary bladder is unremarkable for degree of distension. Stomach/Bowel: No radiopaque  enteric contrast material was administered. Tiny hiatal hernia otherwise the stomach is unremarkable for degree of distension. No pathologic dilation of small or large bowel. The appendix and terminal ileum appear normal. Moderate volume of formed stool in the proximal colon. Left-sided colonic diverticulosis without findings of acute diverticulitis. Vascular/Lymphatic: Normal caliber abdominal aorta. No pathologically enlarged abdominal or pelvic lymph nodes. Reproductive: Status post hysterectomy. No adnexal masses. Other: No significant abdominopelvic free fluid. Postsurgical change in the anterior abdominal wall. No pneumoperitoneum. Musculoskeletal: No acute osseous abnormality. Multilevel degenerative changes spine. IMPRESSION: 1. No acute abnormality in the abdomen or pelvis. 2. Bilateral nonobstructive renal stones measure up to 5 mm. 3. Left-sided colonic diverticulosis without findings of acute diverticulitis. 4. Benign right adrenal adenoma overall stable dating back to May 12, 2011 consistent with a benign finding which requires no independent imaging follow-up. Electronically Signed   By: Dahlia Bailiff M.D.   On: 07/07/2021 14:55    Labs on Admission: I have personally reviewed following labs  CBC: Recent Labs  Lab 07/07/21 1005  WBC 7.1  HGB 10.9*  HCT 36.4  MCV 95.3  PLT 128   Basic Metabolic Panel: Recent Labs  Lab 07/07/21 1005  NA 141  K 3.5  CL 108  CO2 29  GLUCOSE 54*  BUN 17  CREATININE 0.89  CALCIUM 8.7*  MG 2.4  PHOS 4.7*   GFR: Estimated Creatinine Clearance: 91.7 mL/min (by C-G formula based on SCr of 0.89 mg/dL).  Liver Function Tests: Recent Labs  Lab 07/07/21 1005  AST 11*  ALT 18  ALKPHOS 96  BILITOT 0.6  PROT 7.2  ALBUMIN 3.4*   Recent Labs  Lab 07/07/21 1005  LIPASE 37   CBG: Recent Labs  Lab 07/07/21 1111 07/07/21 1249 07/07/21 1403 07/07/21 1509 07/07/21 1730  GLUCAP 69* 70 64* 108* 152*   Urine analysis:    Component  Value Date/Time   COLORURINE YELLOW (A) 07/07/2021 1005   APPEARANCEUR CLOUDY (A) 07/07/2021 1005   LABSPEC 1.020 07/07/2021 1005   PHURINE 5.0 07/07/2021 1005   GLUCOSEU >=500 (A) 07/07/2021 1005   GLUCOSEU NEGATIVE 09/21/2019 1038   HGBUR SMALL (A) 07/07/2021 1005   HGBUR negative 09/30/2006 1400   BILIRUBINUR NEGATIVE 07/07/2021 1005   BILIRUBINUR negative 06/04/2020 1535   KETONESUR NEGATIVE 07/07/2021 1005   PROTEINUR NEGATIVE 07/07/2021 1005   UROBILINOGEN 0.2 06/04/2020 1535   UROBILINOGEN 0.2 09/21/2019 1038   NITRITE POSITIVE (A) 07/07/2021 1005   LEUKOCYTESUR MODERATE (A) 07/07/2021 1005   CRITICAL CARE Performed by: Briant Cedar Rj Pedrosa  Total critical care time: 35 minutes  Critical care time was exclusive of separately billable procedures and treating other patients.  Critical care was necessary to treat or prevent imminent or life-threatening deterioration.  Endocrine crisis.  Metabolic failure.  Critical care was time spent personally by me on the following activities: development of treatment plan with patient and/or surrogate as well as nursing, discussions with consultants, evaluation of patient's response to treatment, examination of patient, obtaining history from patient or surrogate, ordering and performing treatments and interventions, ordering and review of laboratory studies, ordering and review of radiographic studies, pulse oximetry and re-evaluation of patient's condition.  Dr. Tobie Poet [facility]  If  7PM-7AM, please contact overnight-coverage provider If 7AM-7PM, please contact day coverage provider [email or web address]  07/07/2021, 6:30 PM

## 2021-07-07 NOTE — Assessment & Plan Note (Addendum)
Appears due to insulin pump settings not being updated when patient transitioned from U- 102 U-200 insulin.  Diabetes coordinator reached out to Dr. Dwyane Dee, patient's endocrinologist, settings have been updated per his instructions. Hypoglycemia resolved with dextrose fluids. -- Patient off dextrose fluids --Closely monitor CBGs --Resume D5 or D10 if recurrent hypoglycemia -- Admit to telemetry med floor --Appreciate diabetes coordinator assistance --Close follow-up with endocrinologist Dr. Dwyane Dee after discharge

## 2021-07-07 NOTE — ED Provider Notes (Signed)
[facility] Provider Note    Event Date/Time   First MD Initiated Contact with Patient 07/07/21 1039     (approximate)   History   Hypoglycemia   HPI  [name] is a 56 y.o. female who presents to the ED for evaluation of Hypoglycemia   I reviewed PCP visit from 6 days ago where she followed up after an ED visit for hypoglycemia.  She is on an insulin pump.  Apparently her CGM charts show hypoglycemia nearly every day between midnight and 9 AM.  She was given a prescription for glucagon.  Patient returns to the ED with hypoglycemia after being found down.  Initial glucose apparently in the 20s, improved with D50.  Insulin pump was on and active, removed by the time she arrives.  At the time I see her, she is sitting up in bed and eating, reports feeling better but still somewhat dizzy and has glucose in the 50s.  She is getting more D50 as we talked.  She reports that she does not know what happened.  No abdominal pain, no emesis.  Physical Exam   Triage Vital Signs: ED Triage Vitals  Enc Vitals Group     BP 07/07/21 0952 135/90     Pulse Rate 07/07/21 0952 62     Resp 07/07/21 0952 16     Temp 07/07/21 0952 98.5 F (36.9 C)     Temp Source 07/07/21 0952 Oral     SpO2 07/07/21 0952 100 %     Weight 07/07/21 0953 257 lb 8 oz (116.8 kg)     Height 07/07/21 0953 5' 6"  (1.676 m)     Head Circumference --      Peak Flow --      Pain Score 07/07/21 0952 9     Pain Loc --      Pain Edu? --      Excl. in [place]? --     Most recent vital signs: Vitals:   07/07/21 1200 07/07/21 1300  BP: (!) 168/99 (!) 176/92  Pulse: 65 66  Resp: 17   Temp:    SpO2: 97% 100%    General: Awake, no distress.  Obese.  Sitting up in bed and conversational. CV:  Good peripheral perfusion.  Resp:  Normal effort.  Abd:  No distention.  Suprapubic tenderness without peritoneal features. MSK:  No deformity noted.  Neuro:  No focal deficits  appreciated. Other:     ED Results / Procedures / Treatments   Labs (all labs ordered are listed, but only abnormal results are displayed) Labs Reviewed  COMPREHENSIVE METABOLIC PANEL - Abnormal; Notable for the following components:      Result Value   Glucose, Bld 54 (*)    Calcium 8.7 (*)    Albumin 3.4 (*)    AST 11 (*)    Anion gap 4 (*)    All other components within normal limits  CBC - Abnormal; Notable for the following components:   RBC 3.82 (*)    Hemoglobin 10.9 (*)    MCHC 29.9 (*)    All other components within normal limits  URINALYSIS, ROUTINE W REFLEX MICROSCOPIC - Abnormal; Notable for the following components:   Color, Urine YELLOW (*)    APPearance CLOUDY (*)    Glucose, UA >=500 (*)    Hgb urine dipstick SMALL (*)    Nitrite POSITIVE (*)    Leukocytes,Ua MODERATE (*)    WBC, UA >50 (*)  Bacteria, UA MANY (*)    All other components within normal limits  CBG MONITORING, ED - Abnormal; Notable for the following components:   Glucose-Capillary 66 (*)    All other components within normal limits  CBG MONITORING, ED - Abnormal; Notable for the following components:   Glucose-Capillary 48 (*)    All other components within normal limits  CBG MONITORING, ED - Abnormal; Notable for the following components:   Glucose-Capillary 69 (*)    All other components within normal limits  CBG MONITORING, ED - Abnormal; Notable for the following components:   Glucose-Capillary 64 (*)    All other components within normal limits  CBG MONITORING, ED - Abnormal; Notable for the following components:   Glucose-Capillary 108 (*)    All other components within normal limits  URINE CULTURE  LIPASE, BLOOD  CBG MONITORING, ED    EKG Sinus rhythm, rate of 61 bpm.  Normal axis and intervals.  No evidence of acute ischemia.  RADIOLOGY CT abdomen/pelvis interpreted by me without evidence of acute intra-abdominal pathology.  Official radiology report(s): CT ABDOMEN  PELVIS W CONTRAST  Result Date: 07/07/2021 CLINICAL DATA:  Lower abdominal pain and UTI. Concern for small bowel obstruction and pyelonephritis. EXAM: CT ABDOMEN AND PELVIS WITH CONTRAST TECHNIQUE: Multidetector CT imaging of the abdomen and pelvis was performed using the standard protocol following bolus administration of intravenous contrast. RADIATION DOSE REDUCTION: This exam was performed according to the departmental dose-optimization program which includes automated exposure control, adjustment of the mA and/or kV according to patient size and/or use of iterative reconstruction technique. CONTRAST:  159m OMNIPAQUE IOHEXOL 300 MG/ML  SOLN COMPARISON:  Multiple priors including most recent CT Jun 08, 2021 FINDINGS: Lower chest: Scarring versus atelectasis in the bilateral lung base. Patulous esophagus. Hepatobiliary: No suspicious hepatic lesion. Gallbladder is unremarkable. No biliary ductal dilation. Pancreas: No pancreatic ductal dilation or evidence of acute inflammation. Spleen: Normal in size without focal abnormality. Adrenals/Urinary Tract: 2.4 cm right adrenal adenoma is stable dating back to May 12, 2011 consistent with a benign finding requiring no independent imaging follow-up. Left adrenal gland appears normal. No hydronephrosis. Bilateral nonobstructive renal stones measure up to 5 mm. No obstructive ureteral or bladder calculi identified. Right-greater-than-left renal cortical scarring. Kidneys demonstrate symmetric enhancement and excretion of contrast material. Urinary bladder is unremarkable for degree of distension. Stomach/Bowel: No radiopaque enteric contrast material was administered. Tiny hiatal hernia otherwise the stomach is unremarkable for degree of distension. No pathologic dilation of small or large bowel. The appendix and terminal ileum appear normal. Moderate volume of formed stool in the proximal colon. Left-sided colonic diverticulosis without findings of acute  diverticulitis. Vascular/Lymphatic: Normal caliber abdominal aorta. No pathologically enlarged abdominal or pelvic lymph nodes. Reproductive: Status post hysterectomy. No adnexal masses. Other: No significant abdominopelvic free fluid. Postsurgical change in the anterior abdominal wall. No pneumoperitoneum. Musculoskeletal: No acute osseous abnormality. Multilevel degenerative changes spine. IMPRESSION: 1. No acute abnormality in the abdomen or pelvis. 2. Bilateral nonobstructive renal stones measure up to 5 mm. 3. Left-sided colonic diverticulosis without findings of acute diverticulitis. 4. Benign right adrenal adenoma overall stable dating back to May 12, 2011 consistent with a benign finding which requires no independent imaging follow-up. Electronically Signed   By: JDahlia BailiffM.D.   On: 07/07/2021 14:55    PROCEDURES and INTERVENTIONS:  .1-3 Lead EKG Interpretation  Performed by: SVladimir Crofts MD Authorized by: SVladimir Crofts MD     Interpretation: normal  ECG rate:  64   ECG rate assessment: normal     Rhythm: sinus rhythm     Ectopy: none     Conduction: normal   .Critical Care  Performed by: Vladimir Crofts, MD Authorized by: Vladimir Crofts, MD   Critical care provider statement:    Critical care time (minutes):  30   Critical care time was exclusive of:  Separately billable procedures and treating other patients   Critical care was necessary to treat or prevent imminent or life-threatening deterioration of the following conditions:  Endocrine crisis and metabolic crisis   Critical care was time spent personally by me on the following activities:  Development of treatment plan with patient or surrogate, discussions with consultants, evaluation of patient's response to treatment, examination of patient, ordering and review of laboratory studies, ordering and review of radiographic studies, ordering and performing treatments and interventions, pulse oximetry, re-evaluation of  patient's condition and review of old charts   Medications  dextrose 10 % and 0.45 % NaCl infusion ( Intravenous Rate/Dose Change 07/07/21 1408)  dextrose 10 % infusion (0 mLs Intravenous Stopped 07/07/21 1357)  dextrose 50 % solution (  Given 07/07/21 1048)  cefTRIAXone (ROCEPHIN) 1 g in sodium chloride 0.9 % 100 mL IVPB (0 g Intravenous Stopped 07/07/21 1256)  ondansetron (ZOFRAN) injection 4 mg (4 mg Intravenous Given 07/07/21 1204)  iohexol (OMNIPAQUE) 300 MG/ML solution 100 mL (100 mLs Intravenous Contrast Given 07/07/21 1435)  dextrose 50 % solution (  Given 07/07/21 1408)     IMPRESSION / MDM / [place] / ED COURSE  I reviewed the triage vital signs and the nursing notes.  Differential diagnosis includes, but is not limited to, pulm dysfunction, intentional overdose, sepsis, cystitis  {Patient presents with symptoms of an acute illness or injury that is potentially life-threatening.  Patient presents with recurrent hypoglycemia likely due to acute cystitis requiring dextrose infusion and admission.  Vitals are reassuring.  She has recurrent hypoglycemia requiring multiple boluses of D50 and a D10 infusion.  Blood work otherwise with reassuring metabolic panel and CBC.  No leukocytosis or sepsis.  Urine with infectious features and with her suprapubic tenderness I am concerned about cystitis.  We will send for culture and start on Rocephin.  We will consult with medicine for admission.  Clinical Course as of 07/07/21 1531  Mon Jul 07, 2021  1124 Reassessed.  Sitting up in bed and conversational.  Glucose improving.  Tolerating p.o. [DS]  7793 Delays in obtaining CT scan due to IV access. [DS]    Clinical Course User Index [DS] Vladimir Crofts, MD     FINAL CLINICAL IMPRESSION(S) / ED DIAGNOSES   Final diagnoses:  Hypoglycemia  Acute cystitis without hematuria     Rx / DC Orders   ED Discharge Orders     None        Note:  This document was prepared using  Dragon voice recognition software and may include unintentional dictation errors.   Vladimir Crofts, MD 07/07/21 1531

## 2021-07-07 NOTE — Assessment & Plan Note (Addendum)
--  Continue home Coreg, spironolactone, hydralazine - IV hydralazine as needed

## 2021-07-07 NOTE — Assessment & Plan Note (Signed)
Continue Crestor and Zetia. 

## 2021-07-07 NOTE — Assessment & Plan Note (Signed)
Patient uses insulin pump at home, held at time of admission. --Appreciate diabetes coordinator communicating with patient's endocrinologist Dr. Dwyane Dee -- Insulin pump settings have been updated for U-200 -- Pump should be resumed and CBGs closely monitored prior to discharge -- Trulicity held --Sliding scale NovoLog - Adjust insulin for goal 140-180 while inpatient

## 2021-07-07 NOTE — ED Notes (Signed)
Patient provided with meal tray and orange juice.

## 2021-07-07 NOTE — ED Notes (Signed)
Pt has an insulin pump that she is  not able to control, has had low cbg's for few weeks. She called EMS this am and cbg 28, given 1amp D10 and went up to 128, then dropped down to 70 and gave oral glucose. Last CBG 132, pt started having symptoms after gastric repair surgery 4 weeks ago.

## 2021-07-07 NOTE — ED Notes (Signed)
Patient provided with crackers and peanut butter.

## 2021-07-07 NOTE — ED Notes (Signed)
Patient transported to CT 

## 2021-07-07 NOTE — Inpatient Diabetes Management (Addendum)
Inpatient Diabetes Program Recommendations  AACE/ADA: New Consensus Statement on Inpatient Glycemic Control   Target Ranges:  Prepandial:   less than 140 mg/dL      Peak postprandial:   less than 180 mg/dL (1-2 hours)      Critically ill patients:  140 - 180 mg/dL    Latest Reference Range & Units 07/07/21 09:54 07/07/21 10:27  Glucose-Capillary 70 - 99 mg/dL 66 (L) 48 (L)    Latest Reference Range & Units 07/07/21 10:05  Glucose 70 - 99 mg/dL 54 (L)   Review of Glycemic Control  Diabetes history: DM2 Outpatient Diabetes medications: Insulin Pump with Humalog, Trulicity 4.5 mg Qweek Current orders for Inpatient glycemic control: None in ED with hypoglycemia  Inpatient Diabetes Program Recommendations:    Insulin: Since insulin pump removed, will need to order CBGs, Novolog 0-15 units Q4H, and will likely need basal insulin as well once hypoglycemia is completely resolved.  NOTE: Noted patient in ED with hypoglycemia. Per notes in chart, patient's insulin pump was removed by RN at 9:50 today.  In reviewing chart, noted patient was inpatient from 06/08/21-06/19/21 and inpatient diabetes coordinator spoke with patient on 06/09/21. Per note on 06/09/21 by. Denny Levy, RN, Diabetes Coordinator, patient sees Dr. Dwyane Dee (Endocrinologist) for DM control. Patient last seen Dr. Dwyane Dee on 03/26/21 and per office note, patient was using an insulin pump and was advised to Switch to U-200 Humalog in the pump Basal rates and Sensitivity adjusted Pump Settings:  12am- 5.2 units/hr 3am- 4.8 units/hr 9am- 5.4 units/hr 3pm- 6 units/hr Carb Ratio= 1 unit for every 1 gram Carbohydrates Sensitivity= 1 unit for every 30 mg/dl > Target CBG Target CBG 120 When pt switches to U-200 Humalog, she should reduce her settings to the following:  12am- 2.6 units/hr 3am- 2.4 units/hr 9am- 2.7 units/hr 3pm- 3 units/hr AND reduce Sensitivity to 1:50  Patient had reported to diabetes coordinator on 06/09/21 that she was  still using Humalog U-100 insulin since she still had Humalog U-100 insulin at home. She planned to start using Humalog U-200 once she ran out of U-100 insulin. Patient was to decrease insulin pump settings once she was using Humalog U-200 insulin. Patient was advised to call Endocrinologist for assistance with making changes if needed. Will plan to follow up with patient when appropriate.  Addendum 07/07/21@12 :35-Spoke to patient at bedside regarding DM control. Patient states that she takes Insulin Pump with Humalog and Trulicity for DM control. Patient states that she is using Humalog U-200 insulin in her insulin pump. She states that she has been using the Humalog U-200 insulin for about 2 months. Informed her that it was noted on 06/09/21 by diabetes coordinator that she had been using Humalog U-100 insulin in her insulin pump at that time. Patient states that was incorrect and she was using Humalog U-200 insulin in her insulin pump at that time. Inquired if any insulin pump changes were made when she switched to Humalog U-200 insulin and she states that the nurse at Dr. Ronnie Derby office helped her make the changes in her pump when she switched insulins. She does not have the personal data device (PDA) that works with her OmniPod insulin pump here at the hospital so not able to confirm pump settings. Question if the changes were made to the settings or not, which could be the cause of her recurrent hypoglycemia over the past several weeks. Patient is using FreeStyle Libre CGM and she is also doing finger sticks and going by  finger sticks since more accurate. Patient states her glucose has been running low since she was discharged on 06/19/21 when she resumed her insulin pump and she had to come to the hospital ED on 06/23/21 due to hypoglycemia.  Reviewed FreeStyle Libre app on her phone and noted glucose has been running low; ranged from 54-75 mg/dl on 07/06/21 and she was having to eat lots more carbs to keep  glucose from going lower. Asked patient to have her daughter bring her PDA so it could be reviewed to determine what her current settings are on her insulin pump.  Also, informed patient I would reach out to Dr. Dwyane Dee to discuss recurrent hypoglycemia.   I spoke with Dr. Dwyane Dee over the phone and he would like me to have patient to get family to bring the PDA so I can review the settings and then send him a secure chat tomorrow once I know the settings for sure for his recommendation. Will plan to follow up with patient tomorrow to review PDA device.  Thanks, Barnie Alderman, RN, MSN, [place] Diabetes Coordinator Inpatient Diabetes Program [phone] (Team Pager from 8am to [place])

## 2021-07-07 NOTE — Hospital Course (Signed)
[name] is a 56 year old female with history of insulin-dependent diabetes mellitus, history of small bowel obstruction secondary to adhesions, episodes of hypoglycemia, hypertension, GERD, hyperlipidemia, constipation, history of CVA with hemiparesis, obstructive sleep apnea, morbid obesity, blindness, history of seizures, OSA, who presents emergency department for chief concerns of hypoglycemia.  Initial vitals in the emergency department showed temperature 98.5, respiration rate of 16, heart rate 62, blood pressure 135/90, SPO2 100% on room air.  Serum sodium 141, potassium 3.5, chloride 108, bicarb 29, BUN is 17, serum creatinine 0.89, GFR 60, nonfasting blood glucose 54, WBC 7.1, hemoglobin 10.9, platelets of 264.  ED treatment: Dextrose 2 A, Zofran 4 mg IV, ceftriaxone 1 g IV, dextrose gtt.

## 2021-07-07 NOTE — Addendum Note (Signed)
Addended by: Delsa Grana on: 07/07/2021 05:45 PM   Modules accepted: Level of Service

## 2021-07-07 NOTE — Assessment & Plan Note (Addendum)
Body mass index is 41.56 kg/m. and multiple comorbidities including hypertension, insulin-dependent diabetes mellitus, hyperlipidemia, history of CVA, history of seizures, OSA. This complicates overall care and prognosis.  Complicates overall care and prognosis.  Recommend lifestyle modifications including physical activity and diet for weight loss and overall long-term health.

## 2021-07-07 NOTE — Assessment & Plan Note (Signed)
>>  ASSESSMENT AND PLAN FOR SEIZURE (HCC) WRITTEN ON 07/08/2021  2:05 PM BY GRIFFITH, KELLY A, DO  --Continue home Keppra  - Ativan  2 mg IV as needed for seizure, 2 doses ordered, with instructions to administer as appropriate and then let provider know - EEG canceled as results would not change management. If patient being found down was due to seizure, it was most likely induced by hypoglycemia - Seizure precautions -- Outpatient neurology follow-up

## 2021-07-07 NOTE — ED Triage Notes (Signed)
Has insulin pump that reads her blood sugar.  Says they found her unresponsive and ems found low blood sugar.  Taking off the insulin pump now.  EMS gave dextrose

## 2021-07-07 NOTE — Assessment & Plan Note (Addendum)
-   In-house CPAP machine has been ordered, patient declined stating that she brought her own and she will use her own - Home CPAP nightly

## 2021-07-07 NOTE — Assessment & Plan Note (Addendum)
--  Continue home Keppra - Ativan 2 mg IV as needed for seizure, 2 doses ordered, with instructions to administer as appropriate and then let provider know - EEG canceled as results would not change management. If patient being found down was due to seizure, it was most likely induced by hypoglycemia - Seizure precautions -- Outpatient neurology follow-up

## 2021-07-07 NOTE — Assessment & Plan Note (Signed)
Most likely due to hypoglycemia.  It is possible hypoglycemia led to patient having a seizure and she was found in a postictal state.  She reports compliance with her Keppra. Noncontrast head CT negative. -- Monitor on telemetry -- Manage hypoglycemia as outlined -- Fall precautions -- Orthostatic vitals

## 2021-07-08 DIAGNOSIS — M1990 Unspecified osteoarthritis, unspecified site: Secondary | ICD-10-CM | POA: Diagnosis present

## 2021-07-08 DIAGNOSIS — I129 Hypertensive chronic kidney disease with stage 1 through stage 4 chronic kidney disease, or unspecified chronic kidney disease: Secondary | ICD-10-CM | POA: Diagnosis present

## 2021-07-08 DIAGNOSIS — G40A09 Absence epileptic syndrome, not intractable, without status epilepticus: Secondary | ICD-10-CM | POA: Diagnosis present

## 2021-07-08 DIAGNOSIS — E785 Hyperlipidemia, unspecified: Secondary | ICD-10-CM | POA: Diagnosis present

## 2021-07-08 DIAGNOSIS — G4733 Obstructive sleep apnea (adult) (pediatric): Secondary | ICD-10-CM | POA: Diagnosis present

## 2021-07-08 DIAGNOSIS — T85614A Breakdown (mechanical) of insulin pump, initial encounter: Secondary | ICD-10-CM | POA: Diagnosis present

## 2021-07-08 DIAGNOSIS — Y742 Prosthetic and other implants, materials and accessory general hospital and personal-use devices associated with adverse incidents: Secondary | ICD-10-CM | POA: Diagnosis present

## 2021-07-08 DIAGNOSIS — I252 Old myocardial infarction: Secondary | ICD-10-CM | POA: Diagnosis not present

## 2021-07-08 DIAGNOSIS — B9620 Unspecified Escherichia coli [E. coli] as the cause of diseases classified elsewhere: Secondary | ICD-10-CM | POA: Diagnosis present

## 2021-07-08 DIAGNOSIS — N182 Chronic kidney disease, stage 2 (mild): Secondary | ICD-10-CM | POA: Diagnosis present

## 2021-07-08 DIAGNOSIS — K219 Gastro-esophageal reflux disease without esophagitis: Secondary | ICD-10-CM | POA: Diagnosis present

## 2021-07-08 DIAGNOSIS — E1151 Type 2 diabetes mellitus with diabetic peripheral angiopathy without gangrene: Secondary | ICD-10-CM | POA: Diagnosis present

## 2021-07-08 DIAGNOSIS — E162 Hypoglycemia, unspecified: Secondary | ICD-10-CM | POA: Diagnosis present

## 2021-07-08 DIAGNOSIS — E1165 Type 2 diabetes mellitus with hyperglycemia: Secondary | ICD-10-CM | POA: Diagnosis not present

## 2021-07-08 DIAGNOSIS — F331 Major depressive disorder, recurrent, moderate: Secondary | ICD-10-CM | POA: Diagnosis present

## 2021-07-08 DIAGNOSIS — E11649 Type 2 diabetes mellitus with hypoglycemia without coma: Secondary | ICD-10-CM | POA: Diagnosis present

## 2021-07-08 DIAGNOSIS — E1122 Type 2 diabetes mellitus with diabetic chronic kidney disease: Secondary | ICD-10-CM | POA: Diagnosis present

## 2021-07-08 DIAGNOSIS — Z6841 Body Mass Index (BMI) 40.0 and over, adult: Secondary | ICD-10-CM | POA: Diagnosis not present

## 2021-07-08 DIAGNOSIS — N3000 Acute cystitis without hematuria: Secondary | ICD-10-CM | POA: Diagnosis present

## 2021-07-08 DIAGNOSIS — H548 Legal blindness, as defined in USA: Secondary | ICD-10-CM | POA: Diagnosis present

## 2021-07-08 DIAGNOSIS — E11319 Type 2 diabetes mellitus with unspecified diabetic retinopathy without macular edema: Secondary | ICD-10-CM | POA: Diagnosis present

## 2021-07-08 DIAGNOSIS — I2510 Atherosclerotic heart disease of native coronary artery without angina pectoris: Secondary | ICD-10-CM | POA: Diagnosis present

## 2021-07-08 DIAGNOSIS — Z90710 Acquired absence of both cervix and uterus: Secondary | ICD-10-CM | POA: Diagnosis not present

## 2021-07-08 DIAGNOSIS — E1140 Type 2 diabetes mellitus with diabetic neuropathy, unspecified: Secondary | ICD-10-CM | POA: Diagnosis present

## 2021-07-08 DIAGNOSIS — J449 Chronic obstructive pulmonary disease, unspecified: Secondary | ICD-10-CM | POA: Diagnosis present

## 2021-07-08 LAB — CBC
HCT: 35.6 % — ABNORMAL LOW (ref 36.0–46.0)
Hemoglobin: 11 g/dL — ABNORMAL LOW (ref 12.0–15.0)
MCH: 29.4 pg (ref 26.0–34.0)
MCHC: 30.9 g/dL (ref 30.0–36.0)
MCV: 95.2 fL (ref 80.0–100.0)
Platelets: 252 10*3/uL (ref 150–400)
RBC: 3.74 MIL/uL — ABNORMAL LOW (ref 3.87–5.11)
RDW: 15.3 % (ref 11.5–15.5)
WBC: 10.5 10*3/uL (ref 4.0–10.5)
nRBC: 0 % (ref 0.0–0.2)

## 2021-07-08 LAB — CBG MONITORING, ED
Glucose-Capillary: 260 mg/dL — ABNORMAL HIGH (ref 70–99)
Glucose-Capillary: 245 mg/dL — ABNORMAL HIGH (ref 70–99)
Glucose-Capillary: 196 mg/dL — ABNORMAL HIGH (ref 70–99)

## 2021-07-08 LAB — BASIC METABOLIC PANEL
Anion gap: 8 (ref 5–15)
BUN: 15 mg/dL (ref 6–20)
CO2: 26 mmol/L (ref 22–32)
Calcium: 8.6 mg/dL — ABNORMAL LOW (ref 8.9–10.3)
Chloride: 106 mmol/L (ref 98–111)
Creatinine, Ser: 0.96 mg/dL (ref 0.44–1.00)
GFR, Estimated: >60 mL/min (ref >60)
Glucose, Bld: 282 mg/dL — ABNORMAL HIGH (ref 70–99)
Potassium: 4.2 mmol/L (ref 3.5–5.1)
Sodium: 140 mmol/L (ref 135–145)

## 2021-07-08 LAB — TROPONIN I (HIGH SENSITIVITY)
Troponin I (High Sensitivity): 11 ng/L (ref <18)
Troponin I (High Sensitivity): 15 ng/L (ref <18)

## 2021-07-08 LAB — HIV ANTIBODY (ROUTINE TESTING W REFLEX): HIV Screen 4th Generation wRfx: NONREACTIVE

## 2021-07-08 LAB — GLUCOSE, CAPILLARY
Glucose-Capillary: 187 mg/dL — ABNORMAL HIGH (ref 70–99)
Glucose-Capillary: 186 mg/dL — ABNORMAL HIGH (ref 70–99)

## 2021-07-08 LAB — MAGNESIUM: Magnesium: 2.2 mg/dL (ref 1.7–2.4)

## 2021-07-08 NOTE — Assessment & Plan Note (Signed)
Stable.  Continue Plavix, Crestor, Zetia

## 2021-07-08 NOTE — Inpatient Diabetes Management (Signed)
Inpatient Diabetes Program Recommendations  AACE/ADA: New Consensus Statement on Inpatient Glycemic Control (2015)  Target Ranges:  Prepandial:   less than 140 mg/dL      Peak postprandial:   less than 180 mg/dL (1-2 hours)      Critically ill patients:  140 - 180 mg/dL    Latest Reference Range & Units 07/07/21 12:49 07/07/21 14:03 07/07/21 15:09 07/07/21 17:30 07/07/21 20:18 07/07/21 22:39 07/08/21 01:37 07/08/21 07:50  Glucose-Capillary 70 - 99 mg/dL 70 64 (L) 108 (H) 152 (H) 159 (H) 181 (H) 260 (H) 245 (H)    Latest Reference Range & Units 07/07/21 09:54 07/07/21 10:27 07/07/21 11:11  Glucose-Capillary 70 - 99 mg/dL 66 (L) 48 (L) 69 (L)   Review of Glycemic Control Diabetes history: DM2 Outpatient Diabetes medications: OmniPod Insulin Pump with Humalog Y-482 insulin, Trulicity 4.5 mg Qweek Current orders for Inpatient glycemic control: Novolog 0-15 units TID with meals, Novolog 0-5 units QHS  Inpatient Diabetes Program Recommendations:    Insulin: While inpatient, please consider ordering Semglee 10 units BID if glucose is consistently over 180 mg/dl with Novolog correction.   NOTE: Spoke with patient at bedside. Patient states she feels worse today than yesterday. She did have her family bring her personal data device that works with her OmniPod insulin pump. Reviewed patients insulin pump settings and they are currently set at:   Basal rates:  12A 9.7 units/hour 6A 9.3 units/hour 2P 9.5 units per hour Total basal per day: 227.6 units/24 hours  I:Carb Ratio 1:1 gram (1 unit per 1 gram of carb) Insulin Sensitivity Factor 1:50 mg/dl  Patient again reports she is certain she has been using the Humalog U-200 insulin in the pump (states comes in an insulin pen and she draws the insulin out with a syringe to fill up her pod).   Based on office note on 03/26/21 by Dr. Dwyane Dee (Endocrinologist), once patient switched to Humalog U-200 insulin, patient was to change  basal rates to:  12A  2.6 units/hour 3A 2.4 units/hour 9A 2.7 units/hour 3P 3.0 units/hour  Insulin sensitivity 1:50 mg/dl.    Therefore, the frequent hypoglycemia she has been experiencing is due to her switching to Humalog U-200 insulin and not decreasing basal rates. Communicated via secure chat message to Dr. Dwyane Dee (included Dr. Arbutus Ped) and Dr. Dwyane Dee has advised to decrease insulin pump settings to  Basal rates: 12A 2.6 units/hour 3A 2.4 units/hour 9A 2.7 units/hour 3P 3.0 units/hour  Insulin Sensitivity 1:60 mg/dl  Addendum 07/08/21@12 :40- Spoke with patient again at bedside. Informed patient of Dr. Ronnie Derby recommendations and asked that patient go ahead and call Dr. Ronnie Derby office today to get an appointment for follow up. Assisted patient to make the changes to insulin pump settings which are now  Basal rates: 12A 2.6 units/hour 3A 2.4 units/hour 9A 2.7 units/hour 3P 3.0 units/hour Total Basal: 65.4 units/24 hours  Insulin Sensitivity 1:60 mg/dl  Did not make any change to Insulin to Carb Ratio which is 1:1 gram currently. Asked patient to be sure not to make any other changes with her insulin pump settings unless told to do so by Dr. Dwyane Dee after follow up.  Encouraged patient to be sure to get appointment set up for follow up and to keep the appointment she makes (canceled appointment in April).  Patient does not currently have any Humalog U-200 insulin with her at the hospital. Asked patient to have family bring a Humalog U-200 insulin pen so she can use if attending provider decides  to resume insulin pump while inpatient or just prior to discharge. Patient verbalized understanding of information and has no questions at this time.  Thanks, Barnie Alderman, RN, MSN, [place] Diabetes Coordinator Inpatient Diabetes Program [phone] (Team Pager from 8am to [place])

## 2021-07-08 NOTE — Assessment & Plan Note (Signed)
Chronic, stable without active chest pain

## 2021-07-08 NOTE — Progress Notes (Signed)
       CROSS COVER NOTE  NAME: [name] MRN: [id number] DOB : [birth year]    Date of Service   07/08/21  HPI/Events of Note   Tonight [name] reports 7-8/10 (R) sided chest pain that is sharp. The pain is not reproducible on exam and has been ongoing for years per patient. She reports she has a history of a MI and sees Dr Clayborn Bigness outpatient. She has this pain 1-2 times per month and she normally takes nitroglycerin at home.  Interventions   Plan: EKG Troponin  Continue PRN nitroglycerin and oxycodone as ordered      This document was prepared using Dragon voice recognition software and may include unintentional dictation errors.  Neomia Glass DNP, MHA, [name]-BC Nurse Practitioner [facility] : [facility] Pager [phone]

## 2021-07-08 NOTE — Assessment & Plan Note (Signed)
>>  ASSESSMENT AND PLAN FOR MAJOR DEPRESSIVE DISORDER, RECURRENT EPISODE, MODERATE (HCC) WRITTEN ON 07/08/2021  2:03 PM BY GRIFFITH, KELLY A, DO  Continue home Elavil , Cymbalta

## 2021-07-08 NOTE — Progress Notes (Signed)
Admission profile updated. ?

## 2021-07-08 NOTE — Assessment & Plan Note (Signed)
Continue home Elavil, Cymbalta

## 2021-07-08 NOTE — Assessment & Plan Note (Signed)
Continue Plavix, Crestor and Zetia

## 2021-07-08 NOTE — Progress Notes (Signed)
Progress Note   Patient: [name] HBZ:[id number] DOB: [birth year] DOA: 07/07/2021     0 DOS: the patient was seen and examined on 07/08/2021   Brief hospital course: [name] is a 56 year old female with history of insulin-dependent diabetes mellitus with insulin pump, history of SBO secondary to adhesions, episodes of hypoglycemia, hypertension, GERD, hyperlipidemia, constipation, history of CVA with hemiparesis, OSA, morbid obesity, blindness, history of seizures, who presented to the ED on 07/07/2021 with hypoglycemia with initial glucose 48 (non-fasting), recurring episodes x 2 days.  Family found patient passed out on 6/19 AM for unknown duration of time.  Pt reported having issues with insulin pump.  Pt also reported recent dysuria.  Admitted to the hospital on dextrose infusion and started on IV Rocephin for UTI pending cultures, also for syncope evaluation.  Assessment and Plan: * Hypoglycemia Appears due to insulin pump settings not being updated when patient transitioned from U- 102 U-200 insulin.  Diabetes coordinator reached out to Dr. Dwyane Dee, patient's endocrinologist, settings have been updated per his instructions. Hypoglycemia resolved with dextrose fluids. -- Patient off dextrose fluids --Closely monitor CBGs --Resume D5 or D10 if recurrent hypoglycemia -- Admit to telemetry med floor --Appreciate diabetes coordinator assistance --Close follow-up with endocrinologist Dr. Dwyane Dee after discharge   Syncope Most likely due to hypoglycemia.  It is possible hypoglycemia led to patient having a seizure and she was found in a postictal state.  She reports compliance with her Keppra. Noncontrast head CT negative. -- Monitor on telemetry -- Manage hypoglycemia as outlined -- Fall precautions -- Orthostatic vitals  Seizure ([place]) --Continue home Keppra - Ativan 2 mg IV as needed for seizure, 2 doses ordered, with instructions to administer as appropriate and  then let provider know - EEG canceled as results would not change management. If patient being found down was due to seizure, it was most likely induced by hypoglycemia - Seizure precautions -- Outpatient neurology follow-up  OSA (obstructive sleep apnea) - In-house CPAP machine has been ordered, patient declined stating that she brought her own and she will use her own - Home CPAP nightly  Essential hypertension, benign --Continue home Coreg, spironolactone, hydralazine - IV hydralazine as needed  Type 2 diabetes mellitus with chronic kidney disease, with long-term current use of insulin ([place]) Patient uses insulin pump at home, held at time of admission. --Appreciate diabetes coordinator communicating with patient's endocrinologist Dr. Dwyane Dee -- Insulin pump settings have been updated for U-200 -- Pump should be resumed and CBGs closely monitored prior to discharge -- Trulicity held --Sliding scale NovoLog - Adjust insulin for goal 140-180 while inpatient  Obesity, Class III, BMI 40-49.9 (morbid obesity) ([place]) Body mass index is 41.56 kg/m. and multiple comorbidities including hypertension, insulin-dependent diabetes mellitus, hyperlipidemia, history of CVA, history of seizures, OSA. This complicates overall care and prognosis.  Complicates overall care and prognosis.  Recommend lifestyle modifications including physical activity and diet for weight loss and overall long-term health.   CAD (coronary artery disease) Chronic, stable without active chest pain  Major depressive disorder, recurrent episode, moderate (HCC) Continue home Elavil, Cymbalta  Dyslipidemia Continue Crestor and Zetia  UTI (urinary tract infection) Patient reports history of recurrent UTIs.  She had finished oral antibiotics after prior admission about 1 month ago.  Presented with dysuria and UA grossly positive for infection --Continue Rocephin --Follow urine culture  PAD (peripheral artery disease)  (HCC) Continue Plavix, Crestor and Zetia  CVA, old, hemiparesis ([place]) Stable.  Continue Plavix, Crestor, Zetia  BLINDNESS, LEGAL, [place] DEFINITION .        Subjective: Patient seen in the ED holding for bed today.  She reports feeling better.  She says she cannot really tell if she feels like she had a seizure or not.  She does report recent dysuria, finished oral antibiotics for UTI only about a month ago.  Says she is prone to recurrent UTIs.  Says she has been waking up in cold sweats at night recently otherwise no acute complaints or issues.  Physical Exam: Vitals:   07/08/21 0630 07/08/21 0700 07/08/21 1249 07/08/21 1300  BP: (!) 136/94 (!) 139/91 119/82 113/75  Pulse: 93 93 86 81  Resp: (!) 23 (!) 24 20 20   Temp:   98.3 F (36.8 C) 99.3 F (37.4 C)  TempSrc:   Oral Oral  SpO2: 96% 94% 96% 95%  Weight:      Height:       General exam: awake, alert, no acute distress, morbidly obese HEENT: moist mucus membranes, hearing grossly normal  Respiratory system: CTAB diminished due to body habitus, no wheezes, rales or rhonchi, normal respiratory effort. Cardiovascular system: normal S1/S2, RRR, unable to visualize JVD, no peripheral edema.   Gastrointestinal system: soft, nontender abdomen Central nervous system: A&O x3. no gross focal neurologic deficits, normal speech Extremities: no edema, normal tone Skin: dry, intact, normal temperature Psychiatry: normal mood, congruent affect, judgement and insight appear normal   Data Reviewed:  Notable labs:    Family Communication: Patient's significant other on her cell phone by video during my encounter  Disposition:  Status is: Inpatient Remains inpatient appropriate because: Remains on IV antibiotics pending urine culture and requires further close monitoring for recurrent hypoglycemia.       Planned Discharge Destination: Home    Time spent: 40 minutes  Author: Ezekiel Slocumb, DO 07/08/2021 2:08 PM  For on  call review [email or web address].

## 2021-07-09 ENCOUNTER — Telehealth: Payer: Self-pay | Admitting: Endocrinology

## 2021-07-09 DIAGNOSIS — E162 Hypoglycemia, unspecified: Secondary | ICD-10-CM | POA: Diagnosis not present

## 2021-07-09 LAB — URINE CULTURE: Culture: >=100000 — AB

## 2021-07-09 LAB — GLUCOSE, CAPILLARY: Glucose-Capillary: 215 mg/dL — ABNORMAL HIGH (ref 70–99)

## 2021-07-09 MED ORDER — CEFDINIR 300 MG PO CAPS: 300.0000 mg | ORAL_CAPSULE | Freq: Two times a day (BID) | ORAL | 0 refills | Status: DC

## 2021-07-09 NOTE — Discharge Summary (Addendum)
[name] ASN:[id number] DOB: [birth year] DOA: 07/07/2021  PCP: Delsa Grana, [name]  Admit date: 07/07/2021 Discharge date: 07/09/2021  Time spent: 35 minutes  Recommendations for Outpatient Follow-up:  Close endocrinology f/u     Discharge Diagnoses:  Principal Problem:   Hypoglycemia Active Problems:   Syncope   Seizure ([place])   OSA (obstructive sleep apnea)   Essential hypertension, benign   Type 2 diabetes mellitus with chronic kidney disease, with long-term current use of insulin (HCC)   Obesity, Class III, BMI 40-49.9 (morbid obesity) ([place])   CAD (coronary artery disease)   Major depressive disorder, recurrent episode, moderate (HCC)   Dyslipidemia   BLINDNESS, LEGAL, [place] DEFINITION   CVA, old, hemiparesis ([place])   PAD (peripheral artery disease) ([employer])   UTI (urinary tract infection)   Discharge Condition: stable  Diet recommendation: heart healthy carb modified  Filed Weights   07/07/21 0953  Weight: 116.8 kg    History of present illness:  From admission h and p Patient reports that since Saturday she has been having episodes of hypoglycemia.  Of note, her sugar was 75, she had a banana pudding and recheck the blood glucose 45 minutes later and it was in the 50s.  She then had macaroni and cheese, bread, a popsicle which did help her sugar minimally.  She reports she had her usual chest discomfort that was relieved with nitroglycerin on Saturday as she was having episodes of hypoglycemia.  The episodes of hypoglycemia continued into Sunday.   She was found to have passed out Monday morning.  Patient and family did not know how long she was out.   She reports that over the last few weeks, her insulin pump has not been working appropriately.  She is called the manufacturer and they keep resending her different components however it did not 6-lead malfunction.   In addition she endorses dysuria for 2 days.  She reports she is not sexually active.  Hospital  Course:  Patient presented with symptomatic hypoglycemia. This was found to be secondary to not adjusting insulin pump settings when she was transitioned to concentrated insulin. Patient was evaluated by our diabetes educator who in consultation with patient's outpatient endocrinologist Dr. Dwyane Dee adjusted the settings on her insulin pump. She remained euglycemic overnight and morning of discharge off fluids with glucose. She will have close f/u with endocrinology. She presented with syncope that appears to be 2/2 her profound hypoglycemia. Mentation normalized with normoglycemia. Patient also found to have e coli uti sensitive to cephalosporins, will d/c to complete 7 day course cefdinir.  Procedures: none   Consultations: Diabetes educator  Discharge Exam: Vitals:   07/09/21 0015 07/09/21 0518  BP:  109/66  Pulse:  70  Resp:  16  Temp:  (!) 97.5 F (36.4 C)  SpO2: 95% 97%    General: NAD Cardiovascular: RRR Respiratory: CTAB Abdomen: soft, healing midline incision  Discharge Instructions   Discharge Instructions     Diet - low sodium heart healthy   Complete by: As directed    Increase activity slowly   Complete by: As directed       Allergies as of 07/09/2021       Reactions   Codeine Swelling, Other (See Comments)   Swelling and burning of mouth (inside)   Lactose Intolerance (gi) Nausea And Vomiting        Medication List     TAKE these medications    albuterol 1.25 MG/3ML nebulizer solution Commonly known as: ACCUNEB  Inhale 1 ampule into the lungs every 6 (six) hours as needed for wheezing or shortness of breath.   albuterol 108 (90 Base) MCG/ACT inhaler Commonly known as: ProAir HFA INHALE 2 PUFFS BY MOUTH EVERY 6 HOURS AS NEEDED FOR WHEEZING OR SHORTNESS OF BREATH   amitriptyline 25 MG tablet Commonly known as: ELAVIL Take 2-3 tablets (50-75 mg total) by mouth at bedtime.   baclofen 10 MG tablet Commonly known as: LIORESAL Take 1 tablet (10 mg  total) by mouth 3 (three) times daily as needed for muscle spasms.   bisacodyl 5 MG EC tablet Commonly known as: DULCOLAX Take 1 tablet (5 mg total) by mouth daily.   budesonide 0.5 MG/2ML nebulizer solution Commonly known as: PULMICORT Inhale 0.5 mg into the lungs 2 (two) times daily.   butalbital-acetaminophen-caffeine 50-325-40 MG tablet Commonly known as: FIORICET Take 1-2 tablets by mouth every 6 (six) hours as needed for headache.   carvedilol 25 MG tablet Commonly known as: COREG Take 25 mg by mouth 2 (two) times daily.   cefdinir 300 MG capsule Commonly known as: OMNICEF Take 1 capsule (300 mg total) by mouth 2 (two) times daily. Start taking on: July 10, 2021   cholecalciferol 25 MCG (1000 UNIT) tablet Commonly known as: VITAMIN D3 Take 1,000 Units by mouth daily.   ciclopirox 8 % solution Commonly known as: PENLAC Apply topically at bedtime.   clopidogrel 75 MG tablet Commonly known as: PLAVIX Take 1 tablet (75 mg total) by mouth daily.   cycloSPORINE 0.05 % ophthalmic emulsion Commonly known as: RESTASIS Place 2 drops into both eyes 2 (two) times daily.   DULoxetine 60 MG capsule Commonly known as: Cymbalta Take 1 capsule (60 mg total) by mouth daily.   EPINEPHrine 0.3 mg/0.3 mL Soaj injection Commonly known as: EPI-PEN Inject 0.3 mg into the muscle as needed for anaphylaxis.   ezetimibe 10 MG tablet Commonly known as: Zetia Take 1 tablet (10 mg total) by mouth daily.   fluticasone 50 MCG/ACT nasal spray Commonly known as: FLONASE Place 2 sprays into both nostrils daily.   FreeStyle Libre 2 Sensor Misc Change every 14 days   gabapentin 600 MG tablet Commonly known as: Neurontin 600 mg qAM, 600 mg qPM, 1200 mg QHS   Gvoke HypoPen 2-Pack 1 MG/0.2ML Soaj Generic drug: Glucagon Inject 1 mg into the skin once as needed for up to 1 dose (for hypoglycemia secondary to insulin).   HumaLOG KwikPen 200 UNIT/ML KwikPen Generic drug: insulin  lispro Inject up to 160 units daily in divided doses as directed   hydrALAZINE 50 MG tablet Commonly known as: APRESOLINE Take 1 tablet by mouth 3 (three) times daily.   levETIRAcetam 500 MG tablet Commonly known as: KEPPRA Take 500-750 mg by mouth See admin instructions. Take 1 tablet (530m) by mouth every morning and take 1 tablets (7523m by mouth every night   meclizine 25 MG tablet Commonly known as: ANTIVERT Take 1 tablet (25 mg total) by mouth 2 (two) times daily as needed for dizziness.   mirabegron ER 50 MG Tb24 tablet Commonly known as: Myrbetriq Take 1 tablet (50 mg total) by mouth daily.   nitroGLYCERIN 0.4 MG SL tablet Commonly known as: NITROSTAT Place 0.4 mg under the tongue every 5 (five) minutes as needed for chest pain.   olmesartan-hydrochlorothiazide 40-25 MG tablet Commonly known as: BENICAR HCT TAKE 1 TABLET BY MOUTH EVERY DAY   Omnipod DASH Pods (Gen 4) Misc Change pod once daily   ondansetron  8 MG disintegrating tablet Commonly known as: ZOFRAN-ODT Take 1 tablet (8 mg total) by mouth every 8 (eight) hours as needed for nausea or vomiting.   oxyCODONE-acetaminophen 5-325 MG tablet Commonly known as: PERCOCET/ROXICET Take 1 tablet by mouth every 6 (six) hours as needed for moderate pain or severe pain.   pantoprazole 40 MG tablet Commonly known as: PROTONIX Take 1 tablet (40 mg total) by mouth daily.   Pen Needles 32G X 4 MM Misc Use with Humalog to fill pump daily   polyethylene glycol 17 g packet Commonly known as: MIRALAX / GLYCOLAX Take 17 g by mouth daily as needed for mild constipation.   potassium chloride SA 20 MEQ tablet Commonly known as: Klor-Con M20 You have not needed potassium while in the hospital.  Hold until outpatient followup. What changed:  how much to take how to take this when to take this additional instructions   rosuvastatin 10 MG tablet Commonly known as: CRESTOR Take 1 tablet (10 mg total) by mouth daily.    senna-docusate 8.6-50 MG tablet Commonly known as: Senokot-S Take 1 tablet by mouth 2 (two) times daily.   sodium phosphate 7-19 GM/118ML Enem Place 133 mLs (1 enema total) rectally daily as needed for severe constipation.   spironolactone 25 MG tablet Commonly known as: ALDACTONE Take 25 mg by mouth daily.   Trulicity 4.5 SN/0.5LZ Sopn Generic drug: Dulaglutide Inject 4.5 mg as directed once a week.   Wixela Inhub 250-50 MCG/ACT Aepb Generic drug: fluticasone-salmeterol Inhale 1 puff into the lungs 2 (two) times daily.       Allergies  Allergen Reactions   Codeine Swelling and Other (See Comments)    Swelling and burning of mouth (inside)   Lactose Intolerance (Gi) Nausea And Vomiting    Follow-up Information     Elayne Snare, MD Follow up.   Specialty: Endocrinology Contact information: [place] [address] [phone]                  The results of significant diagnostics from this hospitalization (including imaging, microbiology, ancillary and laboratory) are listed below for reference.    Significant Diagnostic Studies: CT HEAD WO CONTRAST (5MM)  Result Date: 07/07/2021 CLINICAL DATA:  Syncope/presyncope, cerebrovascular cause suspected EXAM: CT HEAD WITHOUT CONTRAST TECHNIQUE: Contiguous axial images were obtained from the base of the skull through the vertex without intravenous contrast. RADIATION DOSE REDUCTION: This exam was performed according to the departmental dose-optimization program which includes automated exposure control, adjustment of the mA and/or kV according to patient size and/or use of iterative reconstruction technique. COMPARISON:  12/31/2020 FINDINGS: Brain: There is no acute intracranial hemorrhage, mass effect, or edema. Gray-white differentiation is preserved. There is no extra-axial fluid collection. Ventricles and sulci are stable in size and configuration. Vascular: No hyperdense vessel or unexpected  calcification. Skull: Calvarium is unremarkable. Sinuses/Orbits: No acute finding. Other: None. IMPRESSION: No acute intracranial abnormality. Electronically Signed   By: Macy Mis M.D.   On: 07/07/2021 17:36   DG Chest Port 1 View  Result Date: 07/07/2021 CLINICAL DATA:  Normal mediastinum and cardiac silhouette. Normal pulmonary vasculature. No evidence of effusion, infiltrate, or pneumothorax. No acute bony abnormality. EXAM: PORTABLE CHEST 1 VIEW COMPARISON:  None Available. FINDINGS: Normal cardiac silhouette. There is mild central venous congestion. Improvement in the pulmonary edema pattern seen on prior. No pneumothorax. No pleural fluid. IMPRESSION: Improvement in pulmonary edema pattern seen on prior. Mild venous congestion remains. Electronically Signed  By: Suzy Bouchard M.D.   On: 07/07/2021 16:42   CT ABDOMEN PELVIS W CONTRAST  Result Date: 07/07/2021 CLINICAL DATA:  Lower abdominal pain and UTI. Concern for small bowel obstruction and pyelonephritis. EXAM: CT ABDOMEN AND PELVIS WITH CONTRAST TECHNIQUE: Multidetector CT imaging of the abdomen and pelvis was performed using the standard protocol following bolus administration of intravenous contrast. RADIATION DOSE REDUCTION: This exam was performed according to the departmental dose-optimization program which includes automated exposure control, adjustment of the mA and/or kV according to patient size and/or use of iterative reconstruction technique. CONTRAST:  163m OMNIPAQUE IOHEXOL 300 MG/ML  SOLN COMPARISON:  Multiple priors including most recent CT Jun 08, 2021 FINDINGS: Lower chest: Scarring versus atelectasis in the bilateral lung base. Patulous esophagus. Hepatobiliary: No suspicious hepatic lesion. Gallbladder is unremarkable. No biliary ductal dilation. Pancreas: No pancreatic ductal dilation or evidence of acute inflammation. Spleen: Normal in size without focal abnormality. Adrenals/Urinary Tract: 2.4 cm right adrenal  adenoma is stable dating back to May 12, 2011 consistent with a benign finding requiring no independent imaging follow-up. Left adrenal gland appears normal. No hydronephrosis. Bilateral nonobstructive renal stones measure up to 5 mm. No obstructive ureteral or bladder calculi identified. Right-greater-than-left renal cortical scarring. Kidneys demonstrate symmetric enhancement and excretion of contrast material. Urinary bladder is unremarkable for degree of distension. Stomach/Bowel: No radiopaque enteric contrast material was administered. Tiny hiatal hernia otherwise the stomach is unremarkable for degree of distension. No pathologic dilation of small or large bowel. The appendix and terminal ileum appear normal. Moderate volume of formed stool in the proximal colon. Left-sided colonic diverticulosis without findings of acute diverticulitis. Vascular/Lymphatic: Normal caliber abdominal aorta. No pathologically enlarged abdominal or pelvic lymph nodes. Reproductive: Status post hysterectomy. No adnexal masses. Other: No significant abdominopelvic free fluid. Postsurgical change in the anterior abdominal wall. No pneumoperitoneum. Musculoskeletal: No acute osseous abnormality. Multilevel degenerative changes spine. IMPRESSION: 1. No acute abnormality in the abdomen or pelvis. 2. Bilateral nonobstructive renal stones measure up to 5 mm. 3. Left-sided colonic diverticulosis without findings of acute diverticulitis. 4. Benign right adrenal adenoma overall stable dating back to May 12, 2011 consistent with a benign finding which requires no independent imaging follow-up. Electronically Signed   By: JDahlia BailiffM.D.   On: 07/07/2021 14:55   DG Chest Portable 1 View  Result Date: 06/23/2021 CLINICAL DATA:  syncope, hypoglycemia, weakness EXAM: PORTABLE CHEST 1 VIEW COMPARISON:  Radiograph dated Jun 10, 2021 FINDINGS: The cardiomediastinal silhouette is unchanged and enlarged in contour. No pleural effusion. No  pneumothorax. Low lung volumes. Perihilar vascular fullness with mild diffuse interstitial prominence. Visualized abdomen is unremarkable. IMPRESSION: Low lung volumes. Favored pulmonary edema. Atypical infection could present similarly. Electronically Signed   By: SValentino SaxonM.D.   On: 06/23/2021 15:57   DG Chest Port 1 View  Result Date: 06/10/2021 CLINICAL DATA:  PICC placement EXAM: PORTABLE CHEST 1 VIEW COMPARISON:  06/08/2021 FINDINGS: A right PICC catheter has been placed with tip projecting over the mid SVC region. No pneumothorax. An enteric tube is in place with tip off the field of view but below the left hemidiaphragm. Shallow inspiration with atelectasis or infiltration in the lung bases. Mild cardiac enlargement. No vascular congestion or edema. IMPRESSION: Appliances appear in satisfactory radiographic position. Shallow inspiration with infiltration or atelectasis in the lung bases. Electronically Signed   By: WLucienne CapersM.D.   On: 06/10/2021 15:21   [place]KG SITE RITE  Result Date: 06/10/2021 If Site  Rite image not attached, placement could not be confirmed due to current cardiac rhythm.  DG Abd 1 View  Result Date: 06/09/2021 CLINICAL DATA:  NG tube advanced. EXAM: ABDOMEN - 1 VIEW COMPARISON:  06/09/2021 FINDINGS: Since the previous study, the enteric tube has been advanced with tip now projected over the left upper quadrant consistent with location in the upper stomach. IMPRESSION: Enteric tube is advanced with tip now projecting in the region of the upper stomach. Electronically Signed   By: Lucienne Capers M.D.   On: 06/09/2021 20:06    Microbiology: Recent Results (from the past 240 hour(s))  Urine Culture     Status: Abnormal   Collection Time: 07/07/21 10:05 AM   Specimen: Urine, Random  Result Value Ref Range Status   Specimen Description   Final    URINE, RANDOM Performed at [facility], [address], [place], [place] [address]    Special  Requests   Final    NONE Performed at [facility], [address] [place], [place] [address]    Culture >=100,000 COLONIES/mL ESCHERICHIA COLI (A)  Final   Report Status 07/09/2021 FINAL  Final   Organism ID, Bacteria ESCHERICHIA COLI (A)  Final      Susceptibility   Escherichia coli - MIC*    AMPICILLIN >=32 RESISTANT Resistant     CEFAZOLIN <=4 SENSITIVE Sensitive     CEFEPIME <=0.12 SENSITIVE Sensitive     CEFTRIAXONE <=0.25 SENSITIVE Sensitive     CIPROFLOXACIN 1 RESISTANT Resistant     GENTAMICIN <=1 SENSITIVE Sensitive     IMIPENEM <=0.25 SENSITIVE Sensitive     NITROFURANTOIN 64 INTERMEDIATE Intermediate     TRIMETH/SULFA <=20 SENSITIVE Sensitive     AMPICILLIN/SULBACTAM 16 INTERMEDIATE Intermediate     PIP/TAZO <=4 SENSITIVE Sensitive     * >=100,000 COLONIES/mL ESCHERICHIA COLI     Labs: Basic Metabolic Panel: Recent Labs  Lab 07/07/21 1005 07/08/21 0459  NA 141 140  K 3.5 4.2  CL 108 106  CO2 29 26  GLUCOSE 54* 282*  BUN 17 15  CREATININE 0.89 0.96  CALCIUM 8.7* 8.6*  MG 2.4 2.2  PHOS 4.7*  --    Liver Function Tests: Recent Labs  Lab 07/07/21 1005  AST 11*  ALT 18  ALKPHOS 96  BILITOT 0.6  PROT 7.2  ALBUMIN 3.4*   Recent Labs  Lab 07/07/21 1005  LIPASE 37   No results for input(s): "AMMONIA" in the last 168 hours. CBC: Recent Labs  Lab 07/07/21 1005 07/08/21 0459  WBC 7.1 10.5  HGB 10.9* 11.0*  HCT 36.4 35.6*  MCV 95.3 95.2  PLT 264 252   Cardiac Enzymes: No results for input(s): "CKTOTAL", "CKMB", "CKMBINDEX", "TROPONINI" in the last 168 hours. BNP: BNP (last 3 results) Recent Labs    04/26/21 0429 07/07/21 1005  BNP 276.7* 17.1    ProBNP (last 3 results) No results for input(s): "PROBNP" in the last 8760 hours.  CBG: Recent Labs  Lab 07/08/21 0750 07/08/21 1142 07/08/21 1621 07/08/21 1940 07/09/21 0812  GLUCAP 245* 196* 187* 186* 215*       Signed:  Desma Maxim MD.  [facility] 07/09/2021, 10:44 AM

## 2021-07-09 NOTE — Plan of Care (Signed)
  Problem: Education: Goal: Ability to describe self-care measures that may prevent or decrease complications (Diabetes Survival Skills Education) will improve Outcome: Progressing   Problem: Coping: Goal: Ability to adjust to condition or change in health will improve Outcome: Progressing   Problem: Fluid Volume: Goal: Ability to maintain a balanced intake and output will improve Outcome: Progressing   Problem: Health Behavior/Discharge Planning: Goal: Ability to identify and utilize available resources and services will improve Outcome: Progressing Goal: Ability to manage health-related needs will improve Outcome: Progressing   Problem: Metabolic: Goal: Ability to maintain appropriate glucose levels will improve Outcome: Progressing   Problem: Nutritional: Goal: Maintenance of adequate nutrition will improve Outcome: Progressing Goal: Progress toward achieving an optimal weight will improve Outcome: Progressing   Problem: Skin Integrity: Goal: Risk for impaired skin integrity will decrease Outcome: Progressing   Problem: Education: Goal: Knowledge of General Education information will improve Description: Including pain rating scale, medication(s)/side effects and non-pharmacologic comfort measures Outcome: Progressing   Problem: Health Behavior/Discharge Planning: Goal: Ability to manage health-related needs will improve Outcome: Progressing   Problem: Clinical Measurements: Goal: Ability to maintain clinical measurements within normal limits will improve Outcome: Progressing Goal: Will remain free from infection Outcome: Progressing Goal: Diagnostic test results will improve Outcome: Progressing   Problem: Activity: Goal: Risk for activity intolerance will decrease Outcome: Progressing   Problem: Nutrition: Goal: Adequate nutrition will be maintained Outcome: Progressing   Problem: Coping: Goal: Level of anxiety will decrease Outcome: Progressing    Problem: Pain Managment: Goal: General experience of comfort will improve Outcome: Progressing   Problem: Safety: Goal: Ability to remain free from injury will improve Outcome: Progressing   Problem: Skin Integrity: Goal: Risk for impaired skin integrity will decrease Outcome: Progressing

## 2021-07-09 NOTE — Progress Notes (Signed)
Pt d/c to home via daughter. Ivs removed intact. All belongings sent with pt. All questions answered.

## 2021-07-09 NOTE — Inpatient Diabetes Management (Signed)
Inpatient Diabetes Program Recommendations  AACE/ADA: New Consensus Statement on Inpatient Glycemic Control   Target Ranges:  Prepandial:   less than 140 mg/dL      Peak postprandial:   less than 180 mg/dL (1-2 hours)      Critically ill patients:  140 - 180 mg/dL    Latest Reference Range & Units 07/08/21 07:50 07/08/21 11:42 07/08/21 16:21 07/08/21 19:40 07/09/21 08:12  Glucose-Capillary 70 - 99 mg/dL 245 (H) 196 (H) 187 (H) 186 (H) 215 (H)  (H): Data is abnormally high  Review of Glycemic Control  Diabetes history: DM2 Outpatient Diabetes medications: OmniPod Insulin Pump with Humalog K-935 insulin, Trulicity 4.5 mg Qweek Current orders for Inpatient glycemic control: Novolog 0-15 units TID with meals, Novolog 0-5 units QHS   Inpatient Diabetes Program Recommendations:    Insulin: Please consider ordering Semglee 5 units Q24H.  Outpatient: Insulin Pump settings were adjusted on 07/08/21 (per Dr. Ronnie Derby recommendations). Patient will need to follow up with Dr. Dwyane Dee asap after discharge. Have asked patient to call and make appointment.  NOTE:  Patient uses an Insulin pump with Humalog U-200 insulin (recently switched from Humalog U-100 insulin). Insulin pump settings were not decreased when patient switched to Humalog U-200 insulin which is the cause of recurrent hypoglycemia. Communicated with Dr. Dwyane Dee (patient's Endocrinologist) and asked to assist patient to decrease basal rates and insulin sensitivity (done on 07/08/21). Insulin pump settings are: Basal rates: 12A 2.6 units/hour 3A 2.4 units/hour 9A 2.7 units/hour 3P 3.0 units/hour Total Basal: 65.4 units/24 hours   Insulin Sensitivity 1:60 mg/dl Insulin:Carb Ratio 1:1 gram (1 unit per 1 gram of carb)  Thanks, Barnie Alderman, RN, MSN, [place] Diabetes Coordinator Inpatient Diabetes Program [phone] (Team Pager from 8am to [place])

## 2021-07-09 NOTE — TOC Progression Note (Signed)
Transition of Care [facility]) - Progression Note    Patient Details  Name: [name] MRN: [id number] Date of Birth: 12/14/1965  Transition of Care [facility]) CM/SW [place], LCSW Phone Number: 07/09/2021, 11:29 AM  Clinical Narrative:   Pt is active with [place] with [facility] notified of pt dc today. Taxi voucher provided to [employer].         Expected Discharge Plan and Services           Expected Discharge Date: 07/09/21                                     Social Determinants of Health (SDOH) Interventions    Readmission Risk Interventions    06/11/2021   11:43 AM 05/19/2021   12:02 PM 04/24/2021    3:02 PM  Readmission Risk Prevention Plan  Transportation Screening Complete Complete Complete  PCP or Specialist Appt within 5-7 Days  Complete   Home Care Screening  Complete   Medication Review (RN CM)  Complete   HRI or Home Care Consult   Complete  Palliative Care Screening   Not Applicable  Medication Review ([place]) Complete  Complete  PCP or Specialist appointment within 3-5 days of discharge Complete    HRI or [place] Complete    SW Recovery Care/Counseling Consult Complete    [place] Not Applicable

## 2021-07-10 ENCOUNTER — Telehealth: Payer: Self-pay

## 2021-07-10 NOTE — Telephone Encounter (Signed)
Transition Care Management Unsuccessful Follow-up Telephone Call  Date of discharge and from where:  [facility] on 07/09/21.  Attempts:  1st Attempt  Reason for unsuccessful TCM follow-up call:  No answer/busy. Received voice prompt that member is unavailable. Unable to leave a voice message.   [name] Care Management [facility] [phone]

## 2021-07-11 ENCOUNTER — Telehealth: Payer: Self-pay | Admitting: Family Medicine

## 2021-07-14 ENCOUNTER — Encounter: Payer: Self-pay | Admitting: Endocrinology

## 2021-07-14 ENCOUNTER — Ambulatory Visit (INDEPENDENT_AMBULATORY_CARE_PROVIDER_SITE_OTHER): Payer: Medicare Other | Admitting: Endocrinology

## 2021-07-14 VITALS — BP 132/90 | HR 76 | Ht 66.5 in | Wt 254.0 lb

## 2021-07-14 DIAGNOSIS — I10 Essential (primary) hypertension: Secondary | ICD-10-CM

## 2021-07-14 DIAGNOSIS — Z794 Long term (current) use of insulin: Secondary | ICD-10-CM

## 2021-07-14 DIAGNOSIS — E1165 Type 2 diabetes mellitus with hyperglycemia: Secondary | ICD-10-CM

## 2021-07-14 MED ORDER — DEXCOM G6 TRANSMITTER MISC: 1.0000 | Freq: Once | 1 refills | Status: AC

## 2021-07-14 MED ORDER — DEXCOM G6 SENSOR MISC: 3 refills | Status: DC

## 2021-07-14 MED ORDER — OMNIPOD 5 DEXG7G6 INTRO GEN 5 KIT: 1.0000 | PACK | Freq: Once | 0 refills | Status: AC

## 2021-07-15 ENCOUNTER — Telehealth: Payer: Self-pay

## 2021-07-17 ENCOUNTER — Encounter: Payer: Self-pay | Admitting: Endocrinology

## 2021-07-18 ENCOUNTER — Encounter: Payer: Self-pay | Admitting: Surgery

## 2021-07-18 ENCOUNTER — Ambulatory Visit (INDEPENDENT_AMBULATORY_CARE_PROVIDER_SITE_OTHER): Payer: Medicare Other | Admitting: Surgery

## 2021-07-18 VITALS — BP 162/97 | HR 84 | Temp 98.6°F | Ht 66.5 in | Wt 253.0 lb

## 2021-07-18 DIAGNOSIS — Z09 Encounter for follow-up examination after completed treatment for conditions other than malignant neoplasm: Secondary | ICD-10-CM

## 2021-07-18 DIAGNOSIS — Z8719 Personal history of other diseases of the digestive system: Secondary | ICD-10-CM

## 2021-07-18 NOTE — Progress Notes (Signed)
07/18/2021  HPI: [name] is a 56 y.o. female s/p exploratory laparotomy with mild lysis of adhesions for recurrent small bowel obstruction episodes.  Intraoperatively, she was found to have creeping fat in the distal ileum which raises suspicion for potential IBD.  There was no significant adhesions that could be causing mechanical bowel obstruction.  She has a follow-up appointment with gastroenterology next month to consider colonoscopy.  They, the patient reported she has been improving slowly.  Reports some discomfort still in the midline incision but this has been improving.  Incision is healing well.  She was recently hospitalized due to hypoglycemia and now her endocrinologist is changing the glucose control device that she has.  Vital signs: BP (!) 162/97   Pulse 84   Temp 98.6 F (37 C)   Ht 5' 6.5" (1.689 m)   Wt 253 lb (114.8 kg)   LMP  (LMP Unknown)   SpO2 97%   BMI 40.22 kg/m    Physical Exam: Constitutional: No acute distress Abdomen: Soft, obese, nondistended, appropriately sore to palpation.  Midline incision is healing well and is clean, dry, intact without any evidence of hernia.  Assessment/Plan: This is a 56 y.o. female s/p exploratory laparotomy with mild lysis of adhesions.  - Discussed with the patient again the findings from her surgery.  I think is still be useful to follow-up with gastroenterology and potentially have a colonoscopy done to further evaluate the reason for the creeping fat.  At this point, no further surgical needs.  The patient continues to improve appropriately. - Follow-up with [name] as needed.   Melvyn Neth, [facility]

## 2021-07-18 NOTE — Patient Instructions (Signed)
   Follow-up with our office as needed.  Please call and ask to speak with a nurse if you develop questions or concerns.  

## 2021-07-19 ENCOUNTER — Other Ambulatory Visit: Payer: Self-pay | Admitting: Endocrinology

## 2021-07-26 LAB — HEMOGLOBIN A1C: Hemoglobin A1C: 8

## 2021-07-29 ENCOUNTER — Telehealth: Payer: Self-pay

## 2021-07-29 NOTE — Telephone Encounter (Signed)
Received paper from Access Nurse from 07/26/21 that blood sugar was running at 414 and patient has insulin pump. It does state call was transferred to you and Im just confirmiing because I do not see not in chart.

## 2021-07-30 ENCOUNTER — Other Ambulatory Visit: Payer: Self-pay

## 2021-07-30 ENCOUNTER — Other Ambulatory Visit: Payer: Self-pay | Admitting: Gastroenterology

## 2021-07-30 DIAGNOSIS — K529 Noninfective gastroenteritis and colitis, unspecified: Secondary | ICD-10-CM

## 2021-07-30 DIAGNOSIS — R1084 Generalized abdominal pain: Secondary | ICD-10-CM

## 2021-07-30 MED ORDER — DEXCOM G6 RECEIVER DEVI: 0 refills | Status: DC

## 2021-07-30 NOTE — Telephone Encounter (Signed)
Noted  

## 2021-08-01 ENCOUNTER — Other Ambulatory Visit: Payer: Self-pay | Admitting: Endocrinology

## 2021-08-01 ENCOUNTER — Ambulatory Visit: Payer: Self-pay | Admitting: *Deleted

## 2021-08-01 ENCOUNTER — Other Ambulatory Visit: Payer: Self-pay | Admitting: Nurse Practitioner

## 2021-08-01 ENCOUNTER — Other Ambulatory Visit: Payer: Self-pay | Admitting: Family Medicine

## 2021-08-01 DIAGNOSIS — E1169 Type 2 diabetes mellitus with other specified complication: Secondary | ICD-10-CM

## 2021-08-01 DIAGNOSIS — E1159 Type 2 diabetes mellitus with other circulatory complications: Secondary | ICD-10-CM

## 2021-08-01 DIAGNOSIS — I10 Essential (primary) hypertension: Secondary | ICD-10-CM

## 2021-08-01 NOTE — Telephone Encounter (Signed)
Lvm asking pt to return call to schedule appt. I however do see that she already have a 45mfu appt with JSerafina Royalsfor the end of the month.

## 2021-08-01 NOTE — Telephone Encounter (Signed)
Summary: Medication interaction   Danae Chen, nurse w/ [facility] states patient was prescribed amitriptyline (ELAVIL) 25 MG tablet  and DULoxetine (CYMBALTA) 60 MG capsule on 6-30 by [place]   Caller states pharmacy has concerns of a possible interaction between the two medications   Please assist further      Left message for Danae Chen, will need to pass on to Delsa Grana, [place] for her to discuss with Doroteo Bradford.  Answer Assessment - Initial Assessment Questions 1. REASON FOR CALL or QUESTION: "What is your reason for calling today?" or "How can I best help you?" or "What question do you have that I can help answer?"     Question of if both these meds can be taken together.  Protocols used: Information Only Call - No Triage-A-AH

## 2021-08-01 NOTE — Telephone Encounter (Signed)
Requested Prescriptions  Pending Prescriptions Disp Refills  . rosuvastatin (CRESTOR) 10 MG tablet [Pharmacy Med Name: ROSUVASTATIN CALCIUM 10 MG TAB] 90 tablet 1    Sig: TAKE 1 TABLET BY MOUTH EVERY DAY     Cardiovascular:  Antilipid - Statins 2 Failed - 08/01/2021 10:03 AM      Failed - Lipid Panel in normal range within the last 12 months    Cholesterol, Total  Date Value Ref Range Status  05/08/2015 167 100 - 199 mg/dL Final   Cholesterol  Date Value Ref Range Status  06/02/2021 128 <200 mg/dL Final   LDL Cholesterol (Calc)  Date Value Ref Range Status  06/02/2021 67 mg/dL (calc) Final    Comment:    Reference range: <100 . Desirable range <100 mg/dL for primary prevention;   <70 mg/dL for patients with CHD or diabetic patients  with > or = 2 CHD risk factors. [name] LDL-C is now calculated using the Martin-Hopkins  calculation, which is a validated novel method providing  better accuracy than the Friedewald equation in the  estimation of LDL-C.  Cresenciano Genre et al. Annamaria Helling. 7619;509(32): 2061-2068  (http://education.[facility].com/faq/FAQ164)    HDL  Date Value Ref Range Status  06/02/2021 41 (L) > OR = 50 mg/dL Final  05/08/2015 48 >39 mg/dL Final   Triglycerides  Date Value Ref Range Status  06/19/2021 71 <150 mg/dL Final    Comment:    Performed at [facility], [address] [place], [place] [address]         Passed - Cr in normal range and within 360 days    Creat  Date Value Ref Range Status  06/02/2021 1.28 (H) 0.50 - 1.03 mg/dL Final   Creatinine, Ser  Date Value Ref Range Status  07/08/2021 0.96 0.44 - 1.00 mg/dL Final   Creatinine,U  Date Value Ref Range Status  10/03/2020 128.5 mg/dL Final   Creatinine, Urine  Date Value Ref Range Status  01/24/2018 48 20 - 275 mg/dL Final         Passed - Patient is not pregnant      Passed - Valid encounter within last 12 months    Recent Outpatient Visits          1 month ago Insulin  dependent type 2 diabetes mellitus [facility])   [facility] Delsa Grana, [name]   1 month ago Altered mental status, unspecified altered mental status type   [name] Delsa Grana, [name]   2 months ago Encounter for examination following treatment at hospital   [facility] Delsa Grana, [name]   2 months ago Hospital discharge follow-up   [place], [name]   5 months ago Dilated aortic root [facility])   [facility] Bo Merino, [name]      Future Appointments            In 1 week Reece Packer, Myna Hidalgo, [facility], [place]   In 7 months  [place]

## 2021-08-07 ENCOUNTER — Ambulatory Visit
Admission: RE | Admit: 2021-08-07 | Discharge: 2021-08-07 | Disposition: A | Discharge status: Home / Self Care | Payer: Medicare Other | Source: Ambulatory Visit | Attending: Gastroenterology | Admitting: Gastroenterology

## 2021-08-07 ENCOUNTER — Other Ambulatory Visit: Payer: Self-pay | Admitting: Nurse Practitioner

## 2021-08-07 DIAGNOSIS — K529 Noninfective gastroenteritis and colitis, unspecified: Secondary | ICD-10-CM | POA: Insufficient documentation

## 2021-08-07 DIAGNOSIS — R1084 Generalized abdominal pain: Secondary | ICD-10-CM | POA: Insufficient documentation

## 2021-08-07 DIAGNOSIS — E1169 Type 2 diabetes mellitus with other specified complication: Secondary | ICD-10-CM

## 2021-08-07 MED ORDER — IOHEXOL 300 MG/ML  SOLN
100.0000 mL | Freq: Once | INTRAMUSCULAR | Status: AC | PRN
  Administered 2021-08-07: 100 mL via INTRAVENOUS

## 2021-08-08 NOTE — Telephone Encounter (Signed)
Requested Prescriptions  Pending Prescriptions Disp Refills  . ezetimibe (ZETIA) 10 MG tablet [Pharmacy Med Name: EZETIMIBE 10 MG TABLET] 90 tablet 0    Sig: TAKE 1 TABLET BY MOUTH EVERY DAY     Cardiovascular:  Antilipid - Sterol Transport Inhibitors Failed - 08/07/2021  2:04 PM      Failed - AST in normal range and within 360 days    AST  Date Value Ref Range Status  07/07/2021 11 (L) 15 - 41 U/L Final         Failed - Lipid Panel in normal range within the last 12 months    Cholesterol, Total  Date Value Ref Range Status  05/08/2015 167 100 - 199 mg/dL Final   Cholesterol  Date Value Ref Range Status  06/02/2021 128 <200 mg/dL Final   LDL Cholesterol (Calc)  Date Value Ref Range Status  06/02/2021 67 mg/dL (calc) Final    Comment:    Reference range: <100 . Desirable range <100 mg/dL for primary prevention;   <70 mg/dL for patients with CHD or diabetic patients  with > or = 2 CHD risk factors. [name] LDL-C is now calculated using the Martin-Hopkins  calculation, which is a validated novel method providing  better accuracy than the Friedewald equation in the  estimation of LDL-C.  Cresenciano Genre et al. Annamaria Helling. 9201;007(12): 2061-2068  (http://education.[facility].com/faq/FAQ164)    HDL  Date Value Ref Range Status  06/02/2021 41 (L) > OR = 50 mg/dL Final  05/08/2015 48 >39 mg/dL Final   Triglycerides  Date Value Ref Range Status  06/19/2021 71 <150 mg/dL Final    Comment:    Performed at [facility], [address] [place], [place] [address]         Passed - ALT in normal range and within 360 days    ALT  Date Value Ref Range Status  07/07/2021 18 0 - 44 U/L Final         Passed - Patient is not pregnant      Passed - Valid encounter within last 12 months    Recent Outpatient Visits          1 month ago Insulin dependent type 2 diabetes mellitus [facility])   [facility] [place], Kristeen Miss, [name]   1 month ago Altered mental status,  unspecified altered mental status type   [facility] Delsa Grana, [name]   2 months ago Encounter for examination following treatment at hospital   [facility] Delsa Grana, [name]   2 months ago Hospital discharge follow-up   [place], [name]   5 months ago Dilated aortic root [facility])   [facility] Bo Merino, [name]      Future Appointments            In 6 days Reece Packer, Myna Hidalgo, [facility], [place]   In 7 months  [facility], [facility]

## 2021-08-12 ENCOUNTER — Ambulatory Visit
Payer: Medicare Other | Attending: Student in an Organized Health Care Education/Training Program | Admitting: Student in an Organized Health Care Education/Training Program

## 2021-08-13 ENCOUNTER — Encounter: Payer: Medicare Other | Attending: Endocrinology | Admitting: Nutrition

## 2021-08-13 ENCOUNTER — Encounter: Payer: Self-pay | Admitting: Surgery

## 2021-08-13 NOTE — Progress Notes (Unsigned)
   LMP  (LMP Unknown)    Subjective:    Patient ID: [name], female    DOB: Aug 21, 1965, 56 y.o.   MRN: [id number]  HPI: [name] is a 56 y.o. female  No chief complaint on file.   Relevant past medical, surgical, family and social history reviewed and updated as indicated. Interim medical history since our last visit reviewed. Allergies and medications reviewed and updated.  Review of Systems  Per HPI unless specifically indicated above     Objective:    LMP  (LMP Unknown)   Wt Readings from Last 3 Encounters:  07/18/21 253 lb (114.8 kg)  07/14/21 254 lb (115.2 kg)  07/07/21 257 lb 8 oz (116.8 kg)    Physical Exam  Results for orders placed or performed in visit on 08/12/21  Hemoglobin A1c  Result Value Ref Range   Hemoglobin A1C 8    *Note: Due to a large number of results and/or encounters for the requested time period, some results have not been displayed. A complete set of results can be found in Results Review.      Assessment & Plan:   Problem List Items Addressed This Visit   None    Follow up plan: No follow-ups on file.

## 2021-08-14 ENCOUNTER — Encounter: Payer: Self-pay | Admitting: Nurse Practitioner

## 2021-08-14 ENCOUNTER — Telehealth: Payer: Self-pay | Admitting: Dietician

## 2021-08-14 ENCOUNTER — Other Ambulatory Visit: Payer: Self-pay

## 2021-08-14 ENCOUNTER — Ambulatory Visit (INDEPENDENT_AMBULATORY_CARE_PROVIDER_SITE_OTHER): Payer: Medicare Other | Admitting: Nurse Practitioner

## 2021-08-14 VITALS — BP 128/82 | HR 84 | Temp 98.7°F | Resp 16 | Ht 66.5 in | Wt 251.5 lb

## 2021-08-14 DIAGNOSIS — I25119 Atherosclerotic heart disease of native coronary artery with unspecified angina pectoris: Secondary | ICD-10-CM

## 2021-08-14 DIAGNOSIS — I5032 Chronic diastolic (congestive) heart failure: Secondary | ICD-10-CM | POA: Diagnosis not present

## 2021-08-14 DIAGNOSIS — E1169 Type 2 diabetes mellitus with other specified complication: Secondary | ICD-10-CM

## 2021-08-14 DIAGNOSIS — I77810 Thoracic aortic ectasia: Secondary | ICD-10-CM | POA: Diagnosis not present

## 2021-08-14 DIAGNOSIS — E66813 Obesity, class 3: Secondary | ICD-10-CM

## 2021-08-14 DIAGNOSIS — I10 Essential (primary) hypertension: Secondary | ICD-10-CM

## 2021-08-14 DIAGNOSIS — E1142 Type 2 diabetes mellitus with diabetic polyneuropathy: Secondary | ICD-10-CM

## 2021-08-14 DIAGNOSIS — K219 Gastro-esophageal reflux disease without esophagitis: Secondary | ICD-10-CM

## 2021-08-14 DIAGNOSIS — E785 Hyperlipidemia, unspecified: Secondary | ICD-10-CM

## 2021-08-14 DIAGNOSIS — F330 Major depressive disorder, recurrent, mild: Secondary | ICD-10-CM

## 2021-08-14 DIAGNOSIS — I209 Angina pectoris, unspecified: Secondary | ICD-10-CM

## 2021-08-14 MED ORDER — DULOXETINE HCL 60 MG PO CPEP: 60.0000 mg | ORAL_CAPSULE | Freq: Every day | ORAL | 3 refills | Status: DC

## 2021-08-14 MED ORDER — ROSUVASTATIN CALCIUM 10 MG PO TABS: 10.0000 mg | ORAL_TABLET | Freq: Every day | ORAL | 3 refills | Status: DC

## 2021-08-14 MED ORDER — EZETIMIBE 10 MG PO TABS: 10.0000 mg | ORAL_TABLET | Freq: Every day | ORAL | 3 refills | Status: DC

## 2021-08-14 NOTE — Assessment & Plan Note (Signed)
Patient reports that she is taking pantoprazole 40 mg daily.  Patient states she has been doing much better.

## 2021-08-14 NOTE — Assessment & Plan Note (Signed)
She denies any chest pain and is doing well at this time.

## 2021-08-14 NOTE — Assessment & Plan Note (Signed)
Patient's weight is 251 pounds with a BMI of 39.99.  Patient is working on eating healthy.  Patient finds it difficult to exercise.  Increase physical activity as tolerated.

## 2021-08-14 NOTE — Assessment & Plan Note (Signed)
Continue following cardiology recommendations.  Continue taking Coreg, Farxiga, olmesartan-HCTZ and spironolactone.  Patient was prescribed Lasix 20 mg daily as needed for swelling but she has not started yet.  Patient states she is going to go pick it up.

## 2021-08-14 NOTE — Assessment & Plan Note (Signed)
No changes.  Dr. Clayborn Bigness is her cardiologist.

## 2021-08-14 NOTE — Telephone Encounter (Signed)
Returned patient call.   Patient had tried to apply the Dexcom G6 at home and was unable to figure out how to put the Transmitter into the Sensor.  She uses the receiver.  The sensor needs to be disabled and she was unable to figure this out over the phone.   Instructed patient to call Dexcom tech support for assistance and she should explain to them the circumstances as they may provide her with a new sensor.   Explained how the transmitter fits into the sensor and that she needs to have this applied to her body before insertion.   She states that she has an appointment with her PCP and is hoping that they will be able to help her with this.  Patient to call for further questions.  Antonieta Iba, RD, LDN, CDCES

## 2021-08-14 NOTE — Assessment & Plan Note (Signed)
Continue taking hydralazine 50 mg 3 times a day, olmesartan-hydrochlorothiazide 40-25 mg daily, carvedilol 25 mg 2 times a day and spironolactone 25 mg daily.  Blood pressure at goal today at 128/82.  We will continue with current treatment plan.

## 2021-08-14 NOTE — Assessment & Plan Note (Signed)
Continue taking rosuvastatin 10 mg daily and Zetia 10 mg daily.

## 2021-08-14 NOTE — Assessment & Plan Note (Signed)
Depression screening positive today she is currently taking Cymbalta 60 mg daily.  Patient states she is actually feeling pretty good.  I would like to continue taking the Cymbalta 60 mg.  No changes in care plan

## 2021-08-14 NOTE — Assessment & Plan Note (Signed)
Continue following cardiology recommendations.  Continue taking Coreg, Farxiga, olmesartan-HCTZ and spironolactone.  Patient was prescribed Lasix 20 mg daily as needed for swelling but she has not started yet.  Patient states she is going to go pick it up.  She is also currently taking Zetia 10 mg and rosuvastatin 10 mg daily.

## 2021-08-14 NOTE — Assessment & Plan Note (Signed)
Patient is under the care of Dr. Dwyane Dee.  Patient last saw on 07/14/2021.  Her last A1c was 8 on 07/26/2021.  She is currently using an insulin pump and is also on Farxiga and Trulicity 3 mg weekly.  We will continue with current treatment plan.  Assisted patient today with applying Dexcom.

## 2021-08-18 ENCOUNTER — Ambulatory Visit: Payer: Self-pay | Admitting: *Deleted

## 2021-08-18 NOTE — Telephone Encounter (Signed)
  Chief Complaint: Bilateral flank pain.  GI dr ordered CT Scan of abd and it also showed 8 kidney stones on right side.   Instructed to call her PCP. Symptoms: bilateral flank pain Frequency: Using Percocet for pain Pertinent Negatives: Patient denies blood in urine.   She has had kidney stones before Disposition: [] ED /[] Urgent Care (no appt availability in office) / [x] Appointment(In office/virtual)/ []  [place] [facility]/ [] Home Care/ [] Refused Recommended Disposition /[] [place] [facility]/ []  Follow-up with PCP Additional Notes: Virtual visit scheduled with Serafina Royals, [name] for 08/28/2021 at pt request due to limited transportation.     She requests to be called because her internet connection does not always support video.  Her number is [phone].

## 2021-08-18 NOTE — Telephone Encounter (Signed)
Reason for Disposition  MODERATE pain (e.g., interferes with normal activities or awakens from sleep)  Answer Assessment - Initial Assessment Questions 1. LOCATION: "Where does it hurt?" (e.g., left, right)     I had a CT scan done of my stomach by my GI dr.   Deborha Payment found 8 kidney stones and to call my PCP.    The CT scan showed kidney stones on right side.   I've had kidney stones before.  I'm taking pain medication 2. ONSET: "When did the pain start?"     CT scan done last Thur. Stones found. 3. SEVERITY: "How bad is the pain?" (e.g., Scale 1-10; mild, moderate, or severe)   - MILD (1-3): doesn't interfere with normal activities    - MODERATE (4-7): interferes with normal activities or awakens from sleep    - SEVERE (8-10): excruciating pain and patient unable to do normal activities (stays in bed)       Mild now with pain medication 4. PATTERN: "Does the pain come and go, or is it constant?"      Comes and goes  5. CAUSE: "What do you think is causing the pain?"     Kidney Stones 6. OTHER SYMPTOMS:  "Do you have any other symptoms?" (e.g., fever, abdomen pain, vomiting, leg weakness, burning with urination, blood in urine)     My whole lower back over kidneys 7. PREGNANCY:  "Is there any chance you are pregnant?" "When was your last menstrual period?"     Not asked  Protocols used: Flank Pain-A-AH

## 2021-08-19 ENCOUNTER — Encounter: Payer: Self-pay | Admitting: Nutrition

## 2021-08-26 ENCOUNTER — Telehealth: Payer: Self-pay | Admitting: Nutrition

## 2021-08-26 NOTE — Progress Notes (Signed)
Patient's phone would not support the app.  We will need to order reader and sensors for this.  Note to Alycia for this

## 2021-08-26 NOTE — Patient Instructions (Signed)
Call when you get Dexcoms for training

## 2021-08-26 NOTE — Telephone Encounter (Signed)
Tried several times to get patient on cell/home number.  Message says it is not working.  Called daughter and left message to call me with mother's number, or have mother call me.  Telephone number given.  Patient called me back, saying she got assistance in setting up reader and using the Dexcom, and did not need assistance with this.

## 2021-08-27 ENCOUNTER — Ambulatory Visit: Payer: Self-pay

## 2021-08-27 NOTE — Chronic Care Management (AMB) (Signed)
   08/27/2021  [name] [birth year] [id number]   Documentation encounter created to complete case transition. The care management team will continue to follow for care coordination.  [place] [facility] [phone]

## 2021-08-28 ENCOUNTER — Telehealth (INDEPENDENT_AMBULATORY_CARE_PROVIDER_SITE_OTHER): Payer: Medicare Other | Admitting: Nurse Practitioner

## 2021-08-28 NOTE — Progress Notes (Signed)
Unable to get ahold of patient

## 2021-08-29 ENCOUNTER — Ambulatory Visit: Payer: Self-pay

## 2021-08-29 ENCOUNTER — Telehealth: Payer: Self-pay

## 2021-08-29 ENCOUNTER — Encounter: Admission: RE | Payer: Self-pay | Source: Home / Self Care

## 2021-08-29 ENCOUNTER — Ambulatory Visit: Admission: RE | Admit: 2021-08-29 | Payer: Medicare Other | Source: Home / Self Care | Admitting: Gastroenterology

## 2021-08-29 SURGERY — COLONOSCOPY
Anesthesia: General

## 2021-08-29 NOTE — Telephone Encounter (Signed)
Patient called in states that she was supposed to have a colonoscopy done and was placed on a clear liquid diet. States that her blood sugar dropped. Wants to know what to do about insulin when she has to do the clear liquid diet. Please advise.

## 2021-09-01 ENCOUNTER — Ambulatory Visit: Payer: Self-pay

## 2021-09-01 NOTE — Patient Outreach (Signed)
 SABRA

## 2021-09-02 ENCOUNTER — Telehealth: Payer: Self-pay | Admitting: Nurse Practitioner

## 2021-09-02 ENCOUNTER — Ambulatory Visit: Payer: Self-pay

## 2021-09-02 NOTE — Telephone Encounter (Unsigned)
Copied from [place] [phone]. Topic: General - Other >> Sep 02, 2021  3:44 PM Cyndi Bender wrote: Reason for CRM: NP [place] [facility] requested to speak with [facility] Cb# [phone]

## 2021-09-08 ENCOUNTER — Telehealth: Payer: Self-pay | Admitting: Nurse Practitioner

## 2021-09-08 NOTE — Telephone Encounter (Signed)
Copied from [place] [phone]. Topic: General - Other >> Sep 08, 2021 10:33 AM [name] wrote: Patient called and said [name] was supposed to contact her gastrologist to get approval for her to be admitted to the hospital for her colonoscopy. Please follow up with patient.

## 2021-09-09 ENCOUNTER — Ambulatory Visit: Payer: Self-pay

## 2021-09-12 ENCOUNTER — Ambulatory Visit: Payer: Self-pay

## 2021-09-17 LAB — HM DIABETES EYE EXAM

## 2021-09-25 ENCOUNTER — Ambulatory Visit: Payer: Self-pay

## 2021-09-25 NOTE — Telephone Encounter (Signed)
Patient calling back checking on the status of message below. Please advise

## 2021-09-26 ENCOUNTER — Ambulatory Visit: Payer: Self-pay

## 2021-10-02 ENCOUNTER — Ambulatory Visit: Payer: Medicare Other | Admitting: Endocrinology

## 2021-10-07 NOTE — Telephone Encounter (Signed)
Pt would like a call back concerning her having colonoscopy.

## 2021-10-09 ENCOUNTER — Ambulatory Visit: Payer: Self-pay

## 2021-10-09 NOTE — Telephone Encounter (Signed)
BS 234 at fasting this am  VSS asymptomatic  Chief Complaint: fasting am BS 234 Symptoms: none Frequency: was this am - pt stated that 234 is within normal range Pertinent Negatives: Patient denies any sx Disposition: [] ED /[] Urgent Care (no appt availability in office) / [] Appointment(In office/virtual)/ []  [place] [facility]/ [x] Home Care/ [] Refused Recommended Disposition /[] [place] [facility]/ []  Follow-up with PCP Additional Notes: Connie LPN from [place] called to report BS because it is outside the parameters. No action needed. Advised to call back if having: fever, frequent urination, difficulty breathing, dizziness, weakness, vomiting)      Reason for Disposition . Health Information question, no triage required and triager able to answer question  Answer Assessment - Initial Assessment Questions 1. BLOOD GLUCOSE: "What is your blood glucose level?"      234 fasting am  2. ONSET: "When did you check the blood glucose?"     This 3. USUAL RANGE: "What is your glucose level usually?" (e.g., usual fasting morning value, usual evening value)     200's  5. TYPE 1 or 2:  "Do you know what type of diabetes you have?"  (e.g., Type 1, Type 2, Gestational; doesn't know)      Type 2  6. INSULIN: "Do you take insulin?" "What type of insulin(s) do you use? What is the mode of delivery? (syringe, pen; injection or pump)?"      Yes Humalog and Trulicity 7. DIABETES PILLS: "Do you take any pills for your diabetes?" If Yes, ask: "Have you missed taking any pills recently?"     no 8. OTHER SYMPTOMS: "Do you have any symptoms?" (e.g., fever, frequent urination, difficulty breathing, dizziness, weakness, vomiting)     none  Answer Assessment - Initial Assessment Questions 1. REASON FOR CALL or QUESTION: "What is your reason for calling today?" or "How can I best help you?" or "What question do you have that I can help answer?"     Fasting am BS 234  Protocols used: Diabetes -  High Blood Sugar-A-AH, Information Only Call - No Triage-A-AH

## 2021-10-13 ENCOUNTER — Telehealth: Payer: Self-pay | Admitting: Nurse Practitioner

## 2021-10-13 ENCOUNTER — Other Ambulatory Visit: Payer: Self-pay | Admitting: Nurse Practitioner

## 2021-10-13 DIAGNOSIS — Z1211 Encounter for screening for malignant neoplasm of colon: Secondary | ICD-10-CM

## 2021-10-13 NOTE — Telephone Encounter (Signed)
Please put in a referral

## 2021-10-13 NOTE — Telephone Encounter (Addendum)
The patient stated she called the [facility] office at [employer] for her colonoscopy and was advised that they do not have a referral for her.  Pt is requesting a call back when it's resent to schedule.   Please advise.

## 2021-10-13 NOTE — Telephone Encounter (Unsigned)
Copied from [name] [phone]. Topic: Referral - Question >> Oct 13, 2021  9:58 AM [name] wrote: Patient called to get an update on her referral for a colonoscopy. Please advise.

## 2021-10-14 NOTE — Telephone Encounter (Signed)
Patient notified

## 2021-10-14 NOTE — Telephone Encounter (Signed)
Copied from [place] [phone]. Topic: Referral - Question >> Oct 13, 2021  9:58 AM [name] wrote: Patient called to get an update on her referral for a colonoscopy. Please advise. >> Oct 14, 2021 11:40 AM Chapman Fitch wrote: [place] does not do colonoscopy and pt needs new referral sent to [facility] / they need her records from her surgery/ please advise / fax# [phone]

## 2021-10-15 ENCOUNTER — Emergency Department (HOSPITAL_COMMUNITY): Payer: Medicare Other

## 2021-10-15 ENCOUNTER — Inpatient Hospital Stay (HOSPITAL_COMMUNITY)
Admission: EM | Admit: 2021-10-15 | Discharge: 2021-10-21 | DRG: 683 | Disposition: A | Discharge status: Skilled Nursing Facility | Payer: Medicare Other | Attending: Family Medicine | Admitting: Family Medicine

## 2021-10-15 ENCOUNTER — Encounter (HOSPITAL_COMMUNITY): Payer: Self-pay | Admitting: Emergency Medicine

## 2021-10-15 ENCOUNTER — Ambulatory Visit: Payer: Self-pay | Admitting: *Deleted

## 2021-10-15 DIAGNOSIS — E1140 Type 2 diabetes mellitus with diabetic neuropathy, unspecified: Secondary | ICD-10-CM | POA: Diagnosis present

## 2021-10-15 DIAGNOSIS — E785 Hyperlipidemia, unspecified: Secondary | ICD-10-CM | POA: Diagnosis present

## 2021-10-15 DIAGNOSIS — E11319 Type 2 diabetes mellitus with unspecified diabetic retinopathy without macular edema: Secondary | ICD-10-CM | POA: Diagnosis present

## 2021-10-15 DIAGNOSIS — J4489 Other specified chronic obstructive pulmonary disease: Secondary | ICD-10-CM | POA: Diagnosis present

## 2021-10-15 DIAGNOSIS — Z7902 Long term (current) use of antithrombotics/antiplatelets: Secondary | ICD-10-CM

## 2021-10-15 DIAGNOSIS — Z79899 Other long term (current) drug therapy: Secondary | ICD-10-CM

## 2021-10-15 DIAGNOSIS — I77810 Thoracic aortic ectasia: Secondary | ICD-10-CM | POA: Diagnosis present

## 2021-10-15 DIAGNOSIS — Z86718 Personal history of other venous thrombosis and embolism: Secondary | ICD-10-CM

## 2021-10-15 DIAGNOSIS — M542 Cervicalgia: Secondary | ICD-10-CM | POA: Diagnosis present

## 2021-10-15 DIAGNOSIS — G40A09 Absence epileptic syndrome, not intractable, without status epilepticus: Secondary | ICD-10-CM | POA: Diagnosis present

## 2021-10-15 DIAGNOSIS — I5032 Chronic diastolic (congestive) heart failure: Secondary | ICD-10-CM | POA: Diagnosis present

## 2021-10-15 DIAGNOSIS — R739 Hyperglycemia, unspecified: Secondary | ICD-10-CM

## 2021-10-15 DIAGNOSIS — I959 Hypotension, unspecified: Secondary | ICD-10-CM | POA: Diagnosis present

## 2021-10-15 DIAGNOSIS — Z8049 Family history of malignant neoplasm of other genital organs: Secondary | ICD-10-CM

## 2021-10-15 DIAGNOSIS — I110 Hypertensive heart disease with heart failure: Secondary | ICD-10-CM | POA: Diagnosis present

## 2021-10-15 DIAGNOSIS — K219 Gastro-esophageal reflux disease without esophagitis: Secondary | ICD-10-CM | POA: Diagnosis present

## 2021-10-15 DIAGNOSIS — N393 Stress incontinence (female) (male): Secondary | ICD-10-CM | POA: Diagnosis present

## 2021-10-15 DIAGNOSIS — Z91138 Patient's unintentional underdosing of medication regimen for other reason: Secondary | ICD-10-CM

## 2021-10-15 DIAGNOSIS — H409 Unspecified glaucoma: Secondary | ICD-10-CM | POA: Diagnosis present

## 2021-10-15 DIAGNOSIS — I252 Old myocardial infarction: Secondary | ICD-10-CM

## 2021-10-15 DIAGNOSIS — Z794 Long term (current) use of insulin: Secondary | ICD-10-CM

## 2021-10-15 DIAGNOSIS — F32A Depression, unspecified: Secondary | ICD-10-CM | POA: Diagnosis present

## 2021-10-15 DIAGNOSIS — T383X6A Underdosing of insulin and oral hypoglycemic [antidiabetic] drugs, initial encounter: Secondary | ICD-10-CM | POA: Diagnosis present

## 2021-10-15 DIAGNOSIS — R197 Diarrhea, unspecified: Secondary | ICD-10-CM | POA: Diagnosis present

## 2021-10-15 DIAGNOSIS — H548 Legal blindness, as defined in USA: Secondary | ICD-10-CM | POA: Diagnosis present

## 2021-10-15 DIAGNOSIS — S0081XA Abrasion of other part of head, initial encounter: Secondary | ICD-10-CM | POA: Diagnosis present

## 2021-10-15 DIAGNOSIS — R140 Abdominal distension (gaseous): Secondary | ICD-10-CM | POA: Diagnosis not present

## 2021-10-15 DIAGNOSIS — N179 Acute kidney failure, unspecified: Principal | ICD-10-CM | POA: Diagnosis present

## 2021-10-15 DIAGNOSIS — W19XXXA Unspecified fall, initial encounter: Secondary | ICD-10-CM | POA: Diagnosis present

## 2021-10-15 DIAGNOSIS — F419 Anxiety disorder, unspecified: Secondary | ICD-10-CM | POA: Diagnosis present

## 2021-10-15 DIAGNOSIS — Z808 Family history of malignant neoplasm of other organs or systems: Secondary | ICD-10-CM

## 2021-10-15 DIAGNOSIS — Z87442 Personal history of urinary calculi: Secondary | ICD-10-CM

## 2021-10-15 DIAGNOSIS — R55 Syncope and collapse: Secondary | ICD-10-CM | POA: Diagnosis present

## 2021-10-15 DIAGNOSIS — I69344 Monoplegia of lower limb following cerebral infarction affecting left non-dominant side: Secondary | ICD-10-CM

## 2021-10-15 DIAGNOSIS — Z8249 Family history of ischemic heart disease and other diseases of the circulatory system: Secondary | ICD-10-CM

## 2021-10-15 DIAGNOSIS — E860 Dehydration: Secondary | ICD-10-CM | POA: Diagnosis present

## 2021-10-15 DIAGNOSIS — M25532 Pain in left wrist: Secondary | ICD-10-CM | POA: Diagnosis present

## 2021-10-15 DIAGNOSIS — E1165 Type 2 diabetes mellitus with hyperglycemia: Secondary | ICD-10-CM | POA: Diagnosis present

## 2021-10-15 DIAGNOSIS — Z833 Family history of diabetes mellitus: Secondary | ICD-10-CM

## 2021-10-15 DIAGNOSIS — R569 Unspecified convulsions: Secondary | ICD-10-CM | POA: Diagnosis not present

## 2021-10-15 DIAGNOSIS — Z87891 Personal history of nicotine dependence: Secondary | ICD-10-CM

## 2021-10-15 LAB — CBC
HCT: 46.7 % — ABNORMAL HIGH (ref 36.0–46.0)
Hemoglobin: 15.3 g/dL — ABNORMAL HIGH (ref 12.0–15.0)
MCH: 28.6 pg (ref 26.0–34.0)
MCHC: 32.8 g/dL (ref 30.0–36.0)
MCV: 87.3 fL (ref 80.0–100.0)
Platelets: 303 10*3/uL (ref 150–400)
RBC: 5.35 MIL/uL — ABNORMAL HIGH (ref 3.87–5.11)
RDW: 13.4 % (ref 11.5–15.5)
WBC: 11.1 10*3/uL — ABNORMAL HIGH (ref 4.0–10.5)
nRBC: 0 % (ref 0.0–0.2)

## 2021-10-15 LAB — URINALYSIS, ROUTINE W REFLEX MICROSCOPIC
Bilirubin Urine: NEGATIVE
Glucose, UA: >=500 mg/dL — AB
Ketones, ur: NEGATIVE mg/dL
Nitrite: NEGATIVE
Protein, ur: 30 mg/dL — AB
Specific Gravity, Urine: 1.021 (ref 1.005–1.030)
WBC, UA: >50 WBC/hpf — ABNORMAL HIGH (ref 0–5)
pH: 5 (ref 5.0–8.0)

## 2021-10-15 LAB — BETA-HYDROXYBUTYRIC ACID: Beta-Hydroxybutyric Acid: 0.42 mmol/L — ABNORMAL HIGH (ref 0.05–0.27)

## 2021-10-15 LAB — CBG MONITORING, ED
Glucose-Capillary: 405 mg/dL — ABNORMAL HIGH (ref 70–99)
Glucose-Capillary: 375 mg/dL — ABNORMAL HIGH (ref 70–99)
Glucose-Capillary: 410 mg/dL — ABNORMAL HIGH (ref 70–99)

## 2021-10-15 LAB — COMPREHENSIVE METABOLIC PANEL
ALT: 15 U/L (ref 0–44)
AST: 21 U/L (ref 15–41)
Albumin: 4 g/dL (ref 3.5–5.0)
Alkaline Phosphatase: 82 U/L (ref 38–126)
Anion gap: 17 — ABNORMAL HIGH (ref 5–15)
BUN: 36 mg/dL — ABNORMAL HIGH (ref 6–20)
CO2: 25 mmol/L (ref 22–32)
Calcium: 10 mg/dL (ref 8.9–10.3)
Chloride: 90 mmol/L — ABNORMAL LOW (ref 98–111)
Creatinine, Ser: 2.96 mg/dL — ABNORMAL HIGH (ref 0.44–1.00)
GFR, Estimated: 18 mL/min — ABNORMAL LOW (ref >60)
Glucose, Bld: 407 mg/dL — ABNORMAL HIGH (ref 70–99)
Potassium: 3.9 mmol/L (ref 3.5–5.1)
Sodium: 132 mmol/L — ABNORMAL LOW (ref 135–145)
Total Bilirubin: 0.8 mg/dL (ref 0.3–1.2)
Total Protein: 8.2 g/dL — ABNORMAL HIGH (ref 6.5–8.1)

## 2021-10-15 LAB — HEMOGLOBIN A1C
Hgb A1c MFr Bld: 13.7 % — ABNORMAL HIGH (ref 4.8–5.6)
Mean Plasma Glucose: 346.49 mg/dL

## 2021-10-15 LAB — I-STAT BETA HCG BLOOD, ED (MC, WL, AP ONLY): I-stat hCG, quantitative: <5 m[IU]/mL (ref <5)

## 2021-10-15 LAB — OSMOLALITY: Osmolality: 307 mOsm/kg — ABNORMAL HIGH (ref 275–295)

## 2021-10-15 LAB — SODIUM, URINE, RANDOM: Sodium, Ur: 14 mmol/L

## 2021-10-15 LAB — CREATININE, URINE, RANDOM: Creatinine, Urine: 189 mg/dL

## 2021-10-15 MED ORDER — LEVETIRACETAM 500 MG PO TABS
500.0000 mg | ORAL_TABLET | Freq: Every day | ORAL | Status: DC
  Administered 2021-10-16 – 2021-10-21 (×6): 500 mg via ORAL
  Filled 2021-10-15 (×8): qty 1

## 2021-10-15 MED ORDER — HEPARIN SODIUM (PORCINE) 5000 UNIT/ML IJ SOLN
5000.0000 [IU] | Freq: Three times a day (TID) | INTRAMUSCULAR | Status: DC
  Administered 2021-10-16 – 2021-10-21 (×16): 5000 [IU] via SUBCUTANEOUS
  Filled 2021-10-15 (×16): qty 1

## 2021-10-15 MED ORDER — LACTATED RINGERS IV SOLN: INTRAVENOUS | Status: AC

## 2021-10-15 MED ORDER — SODIUM CHLORIDE 0.9% FLUSH: 3.0000 mL | INTRAVENOUS | Status: DC | PRN

## 2021-10-15 MED ORDER — ONDANSETRON HCL 4 MG PO TABS
4.0000 mg | ORAL_TABLET | Freq: Four times a day (QID) | ORAL | Status: DC | PRN
  Administered 2021-10-16 – 2021-10-18 (×4): 4 mg via ORAL
  Filled 2021-10-15 (×5): qty 1

## 2021-10-15 MED ORDER — INSULIN ASPART 100 UNIT/ML IJ SOLN
0.0000 [IU] | Freq: Three times a day (TID) | INTRAMUSCULAR | Status: DC
  Administered 2021-10-16 (×2): 20 [IU] via SUBCUTANEOUS
  Administered 2021-10-16: 7 [IU] via SUBCUTANEOUS
  Administered 2021-10-17: 11 [IU] via SUBCUTANEOUS
  Administered 2021-10-17 (×2): 15 [IU] via SUBCUTANEOUS
  Administered 2021-10-18: 11 [IU] via SUBCUTANEOUS
  Administered 2021-10-18: 15 [IU] via SUBCUTANEOUS
  Administered 2021-10-18 – 2021-10-20 (×7): 7 [IU] via SUBCUTANEOUS
  Administered 2021-10-21 (×2): 11 [IU] via SUBCUTANEOUS

## 2021-10-15 MED ORDER — ACETAMINOPHEN 650 MG RE SUPP: 650.0000 mg | Freq: Four times a day (QID) | RECTAL | Status: DC | PRN

## 2021-10-15 MED ORDER — ONDANSETRON HCL 4 MG/2ML IJ SOLN
4.0000 mg | Freq: Once | INTRAMUSCULAR | Status: AC
  Administered 2021-10-15: 4 mg via INTRAVENOUS
  Filled 2021-10-15: qty 2

## 2021-10-15 MED ORDER — LEVETIRACETAM 750 MG PO TABS
750.0000 mg | ORAL_TABLET | Freq: Every evening | ORAL | Status: DC
  Administered 2021-10-15 – 2021-10-20 (×6): 750 mg via ORAL
  Filled 2021-10-15 (×8): qty 1

## 2021-10-15 MED ORDER — LACTATED RINGERS IV BOLUS
1000.0000 mL | Freq: Once | INTRAVENOUS | Status: AC
  Administered 2021-10-15: 1000 mL via INTRAVENOUS

## 2021-10-15 MED ORDER — SODIUM CHLORIDE 0.9 % IV SOLN: 250.0000 mL | INTRAVENOUS | Status: DC | PRN

## 2021-10-15 MED ORDER — INSULIN ASPART 100 UNIT/ML IJ SOLN
0.0000 [IU] | Freq: Every day | INTRAMUSCULAR | Status: DC
  Administered 2021-10-16: 5 [IU] via SUBCUTANEOUS
  Administered 2021-10-17: 4 [IU] via SUBCUTANEOUS
  Administered 2021-10-18 – 2021-10-20 (×3): 2 [IU] via SUBCUTANEOUS

## 2021-10-15 MED ORDER — INSULIN ASPART 100 UNIT/ML IJ SOLN
7.0000 [IU] | Freq: Once | INTRAMUSCULAR | Status: AC
  Administered 2021-10-15: 7 [IU] via INTRAVENOUS

## 2021-10-15 MED ORDER — ACETAMINOPHEN 325 MG PO TABS
650.0000 mg | ORAL_TABLET | Freq: Four times a day (QID) | ORAL | Status: DC | PRN
  Administered 2021-10-15 – 2021-10-21 (×6): 650 mg via ORAL
  Filled 2021-10-15 (×6): qty 2

## 2021-10-15 MED ORDER — MORPHINE SULFATE (PF) 4 MG/ML IV SOLN
4.0000 mg | Freq: Once | INTRAVENOUS | Status: AC
  Administered 2021-10-15: 4 mg via INTRAVENOUS
  Filled 2021-10-15: qty 1

## 2021-10-15 MED ORDER — ONDANSETRON HCL 4 MG/2ML IJ SOLN: 4.0000 mg | Freq: Four times a day (QID) | INTRAMUSCULAR | Status: DC | PRN

## 2021-10-15 MED ORDER — LACTATED RINGERS IV SOLN: INTRAVENOUS | Status: DC

## 2021-10-15 MED ORDER — KETOROLAC TROMETHAMINE 15 MG/ML IJ SOLN
15.0000 mg | Freq: Once | INTRAMUSCULAR | Status: DC
  Filled 2021-10-15: qty 1

## 2021-10-15 MED ORDER — SODIUM CHLORIDE 0.9% FLUSH
3.0000 mL | Freq: Two times a day (BID) | INTRAVENOUS | Status: DC
  Administered 2021-10-16 – 2021-10-21 (×8): 3 mL via INTRAVENOUS

## 2021-10-15 NOTE — H&P (Signed)
Emergency room make sure that                                                                             [facility] H&P    Patient Demographics:    [name], is a 56 y.o. female  MRN: [id number]  DOB - [birth year]  Admit Date - 10/15/2021  Referring MD/NP/PA: Dr Regenia Skeeter  Outpatient Primary MD for the patient is Bo Merino, [name]  Patient coming from: home   Chief complaint-fall    HPI:    [name]  is a 56 y.o. female, with medical history of hypertension, prior stroke with left-sided deficits, seizures on medication, COPD, diabetes mellitus type 2, aortic root dilatation, diastolic dysfunction, left eye blindness came to ED after fall.  Patient takes Plavix at home, patient states that this morning her blood sugar was 160 which is low for her.  Patient states that she got up to eat something and then fell forward hitting her face on the floor.  She is not sure what she feels that she may have passed out.  Denies tripping on anything.  Denies biting her tongue or having urinary incontinence. In the ED, she had head CT without any acute hemorrhage, cervical spine showed no fracture.  Lumbar imaging was negative.  Wrist imaging was negative for acute fracture.  Lab work showed acute kidney injury with elevated blood glucose.  Patient takes Benicar HCT at home and says that she was having diarrhea last Friday and felt bloated.  Had very poor p.o. intake over the past few days.  On Tuesday patient says that she was bloated so she did not eat much and was drinking fluids.  This morning patient was feeling very weak.  As per patient she did not urinate at all yesterday.  Denies nausea vomiting Denies chest pain Denies shortness of breath Lab work in the ED showed creatinine of 2.96, her baseline creatinine 0.96.  CBG was elevated 407.  CT renal stone study showed small bilateral nonobstructing calculi in the kidneys, no other acute abnormality.    Review of systems:    In addition  to the HPI above,   All other systems reviewed and are negative.    Past History of the following :    Past Medical History:  Diagnosis Date   Anxiety and depression 09/13/2006   Qualifier: Diagnosis of  By: Hassell Done [name], Nykedtra     Arthritis    joint pain    Asthma    COPD (chronic obstructive pulmonary disease) ([place])    chronic bronchitis    Depression    Diabetes mellitus    since age 18; type 2 IDDM   Diabetic neuropathy, painful ([place])    FEET AND HANDS   Diabetic retinopathy ([place])    Diastolic dysfunction    a.  Echo 11/17: EF 60-65%, mild LVH, no RWMA, Gr1DD, mild AI, dilated aortic root measuring 38 mm, mildly dilated ascending aorta   Dilated aortic root (HCC)    66m by echo 11/2015   Dizziness    secondary to diabetes and hypertension    Epilepsy idiopathic petit mal ([place]    last seizure 2012;controlled w/ topomax  GERD (gastroesophageal reflux disease)    Glaucoma    NOT ON ANY EYE DROPS    Headache(784.0)    migraines    Heart murmur    born with    History of stress test    a. 11/17: Normal perfusion, EF 53%, normal study   Hx of blood clots    hematomas removed from left side of brain from 90mo to 89yrold    Hyperlipidemia    Hypertension    Legally blind    left eye    MIGRAINE HEADACHE 09/14/2006   Qualifier: Diagnosis of  By: MaHassell DoneNP, Nykedtra     [facility]   a.  Patient reported history of without objective documentation   Nephrolithiasis    frequent urination , urination at nite  PT SEEN IN ER 09/22/11 FOR BACK AND RT SIDED PAIN--HAS KNOWN STONE RT URETER AND UA IN ER SHOWED UTI   Pain 09/23/11   LOWER BACK AND RIGHT SIDE--PT HAS RIGHT URETERAL STONE   Pneumonia    hx of 2009   Pyelonephritis    Seizures ([facility]   [facility] ap[facility]    [facility] ; mild   Stress incontinence    Stroke ([facility]   last 2003  RESIDUAL LEFT LEG WEAKNESS--NO OTHER RESIDUAL PROBLEMS      Past Surgical  History:  Procedure Laterality Date   BRAIN HEMATOMA EVACUATION     five procedures total, first procedure when [age] monthsld, last at [age]ears of age.   CYSTOSCOPY W/ RETROGRADES  09/24/2011   Procedure: CYSTOSCOPY WITH RETROGRADE PYELOGRAM;  Surgeon: JoMalka SoMD;  [facility];  Service: Urology;  Laterality: Right;   CYSTOSCOPY WITH URETEROSCOPY  09/24/2011   Procedure: CYSTOSCOPY WITH URETEROSCOPY;  Surgeon: JoMalka SoMD;  [facility];  Service: Urology;  Laterality: Right;  Balloon dilation right ureter    CYSTOSCOPY/RETROGRADE/URETEROSCOPY  08/24/2011   Procedure: CYSTOSCOPY/RETROGRADE/URETEROSCOPY;  Surgeon: DaMolli HazardMD;  [facility]: [facility];  Service: Urology;  Laterality: Right;  Cysto, Right retrograde Pyelogram, right stent placement.    INCISIONAL HERNIA REPAIR  05/05/2011   Procedure: LAPAROSCOPIC INCISIONAL HERNIA;  Surgeon: MaRolm BookbinderMD;  [facility]: [facility];  Laterality: N/A;   kidney stone removal     LAPAROTOMY N/A 06/12/2021   Procedure: EXPLORATORY LAPAROTOMY, LYSIS OF ADHESIONS;  Surgeon: PiOlean ReeMD;  Location: [facility];  Service: General;  Laterality: N/A;   MASS EXCISION  05/05/2011   Procedure: EXCISION MASS;  Surgeon: MaRolm BookbinderMD;  [facility];  Laterality: Right;   [facility]     RETINAL DETACHMENT SURGERY Left 1990   RIGHT/LEFT HEART CATH AND CORONARY ANGIOGRAPHY N/A 04/28/2019   Procedure: RIGHT/LEFT HEART CATH AND CORONARY ANGIOGRAPHY;  Surgeon: CaYolonda KidaMD;  [facility];  Service: Cardiovascular;  Laterality: N/A;   SHUNT REMOVAL     shunt [place] at age 59 78[place] age 56  [place]    Social History:      Social History   Tobacco Use   Smoking status: Former    Packs/day: 1.00    Years: 15.00    Total pack years: 15.00    Types: Cigarettes    Quit date: 01/20/1987    Years since quitting: 34.7   Smokeless tobacco: Never    Tobacco comments:  quit more than 20 years  Substance Use Topics   Alcohol use: Yes    Alcohol/week: 1.0 standard drink of alcohol    Types: 1 Glasses of wine per week       Family History :     Family History  Problem Relation Age of Onset   Uterine cancer Mother    Hypertension Mother    Cancer Mother    Brain cancer Maternal Grandmother    Hypertension Maternal Grandmother    Birth defects Daughter    Hypertension Daughter    Cirrhosis Maternal Grandfather    ADD / ADHD Maternal Grandfather    Birth defects Maternal Grandfather    Diabetes Maternal Grandfather    Anesthesia problems Neg Hx    Colon cancer Neg Hx    Esophageal cancer Neg Hx    Pancreatic cancer Neg Hx       Home Medications:   Prior to Admission medications   Medication Sig Start Date End Date Taking? Authorizing Provider  albuterol (ACCUNEB) 1.25 MG/3ML nebulizer solution Inhale 1 ampule into the lungs every 6 (six) hours as needed for wheezing or shortness of breath.    [provider]  albuterol (PROAIR HFA) 108 (90 Base) MCG/ACT inhaler INHALE 2 PUFFS BY MOUTH EVERY 6 HOURS AS NEEDED FOR WHEEZING OR SHORTNESS OF BREATH 01/24/18   Hubbard Hartshorn, [name]  amitriptyline (ELAVIL) 25 MG tablet Take 2-3 tablets (50-75 mg total) by mouth at bedtime. 01/07/21 07/06/21  Gillis Santa, MD  baclofen (LIORESAL) 10 MG tablet Take 1 tablet (10 mg total) by mouth 3 (three) times daily as needed for muscle spasms. 06/04/21   Delsa Grana, [name]  bisacodyl (DULCOLAX) 5 MG EC tablet Take 1 tablet (5 mg total) by mouth daily. 05/24/21   Nicole Kindred A, DO  budesonide (PULMICORT) 0.5 MG/2ML nebulizer solution Inhale 0.5 mg into the lungs 2 (two) times daily.    Ottie Glazier, MD  butalbital-acetaminophen-caffeine [facility]) [phone] MG tablet Take 1-2 tablets by mouth every 6 (six) hours as needed for headache. 03/10/21 03/10/22  Gillis Santa, MD  carvedilol (COREG) 25 MG tablet Take 25 mg by mouth 2 (two) times  daily. 03/06/21   [provider]  cholecalciferol (VITAMIN D3) 25 MCG (1000 UNIT) tablet Take 1,000 Units by mouth daily.    [provider]  ciclopirox (PENLAC) 8 % solution Apply topically at bedtime. 02/01/21   [provider]  clopidogrel (PLAVIX) 75 MG tablet Take 1 tablet (75 mg total) by mouth daily. 10/10/19   Towanda Malkin, MD  cycloSPORINE (RESTASIS) 0.05 % ophthalmic emulsion Place 2 drops into both eyes 2 (two) times daily.    [provider]  Dulaglutide (TRULICITY) 4.5 FK/8.1EX SOPN Inject 4.5 mg as directed once a week. 10/22/20   Elayne Snare, MD  DULoxetine (CYMBALTA) 60 MG capsule Take 1 capsule (60 mg total) by mouth daily. 08/14/21   Bo Merino, [name]  EPINEPHrine 0.3 mg/0.3 mL IJ SOAJ injection Inject 0.3 mg into the muscle as needed for anaphylaxis. 03/29/19   [provider]  ezetimibe (ZETIA) 10 MG tablet Take 1 tablet (10 mg total) by mouth daily. 08/14/21   Bo Merino, [name]  fluticasone (FLONASE) 50 MCG/ACT nasal spray Place 2 sprays into both nostrils daily. 04/18/19   Towanda Malkin, MD  gabapentin (NEURONTIN) 600 MG tablet 600 mg qAM, 600 mg qPM, 1200 mg QHS 01/07/21   Gillis Santa, MD  GVOKE HYPOPEN 2-PACK 1 MG/0.2ML SOAJ Inject  1 mg into the skin once as needed for up to 1 dose (for hypoglycemia secondary to insulin). 07/01/21   Delsa Grana, [name]  HUMALOG KWIKPEN 200 UNIT/ML KwikPen INJECT UP TO 160 UNITS DAILY IN DIVIDED DOSES AS DIRECTED 08/01/21   Elayne Snare, MD  hydrALAZINE (APRESOLINE) 50 MG tablet Take 1 tablet by mouth 3 (three) times daily. 01/31/21   [provider]  Insulin Disposable Pump (OMNIPOD DASH PODS, GEN 4,) MISC Change pod once daily 07/01/21   Elayne Snare, MD  Insulin Pen Needle (PEN NEEDLES) 32G X 4 MM MISC Use with Humalog to fill pump daily Patient not taking: Reported on 08/14/2021 05/14/21   Elayne Snare, MD  levETIRAcetam (KEPPRA) 500 MG tablet Take 500-750 mg by mouth See  admin instructions. Take 1 tablet (518m) by mouth every morning and take 1 tablets (7577m by mouth every night    [provider]  meclizine (ANTIVERT) 25 MG tablet Take 1 tablet (25 mg total) by mouth 2 (two) times daily as needed for dizziness. 05/23/21   GrEzekiel SlocumbDO  mirabegron ER (MYRBETRIQ) 50 MG TB24 tablet Take 1 tablet (50 mg total) by mouth daily. 08/20/20   RuMyles GipDO  nitroGLYCERIN (NITROSTAT) 0.4 MG SL tablet Place 0.4 mg under the tongue every 5 (five) minutes as needed for chest pain.    [provider]  olmesartan-hydrochlorothiazide (BENICAR HCT) 40-25 MG tablet TAKE 1 TABLET BY MOUTH EVERY DAY 08/01/21   TaDelsa GranaPA-C  ondansetron (ZOFRAN-ODT) 8 MG disintegrating tablet Take 1 tablet (8 mg total) by mouth every 8 (eight) hours as needed for nausea or vomiting. 05/23/21   GrEzekiel SlocumbDO  oxyCODONE-acetaminophen (PERCOCET/ROXICET) 5-325 MG tablet Take 1 tablet by mouth every 6 (six) hours as needed for moderate pain or severe pain. 06/19/21   PaFritzi MandesMD  pantoprazole (PROTONIX) 40 MG tablet Take 1 tablet (40 mg total) by mouth daily. 06/20/21   PaFritzi MandesMD  polyethylene glycol (MIRALAX / GLYCOLAX) 17 g packet Take 17 g by mouth daily as needed for mild constipation. 06/01/19   TaDelsa GranaPA-C  potassium chloride SA (KLOR-CON M20) 20 MEQ tablet You have not needed potassium while in the hospital.  Hold until outpatient followup. Patient taking differently: Take 20 mEq by mouth 2 (two) times daily. 05/21/19   LaEnzo BiMD  rosuvastatin (CRESTOR) 10 MG tablet Take 1 tablet (10 mg total) by mouth daily. 08/14/21   PeBo MerinoFNP  senna-docusate (SENOKOT-S) 8.6-50 MG tablet Take 1 tablet by mouth 2 (two) times daily. 05/23/21   GrEzekiel SlocumbDO  sodium phosphate (FLEET) 7-19 GM/118ML ENEM Place 133 mLs (1 enema total) rectally daily as needed for severe constipation. 05/24/21   GrEzekiel SlocumbDO  spironolactone (ALDACTONE) 25 MG  tablet Take 25 mg by mouth daily.    [provider]  WIGrant RutsNHUB 250-50 MCG/DOSE AEPB Inhale 1 puff into the lungs 2 (two) times daily. 01/22/20   [provider]     Allergies:     Allergies  Allergen Reactions   Codeine Swelling and Other (See Comments)    Swelling and burning of mouth (inside)   Lactose Intolerance (Gi) Nausea And Vomiting     Physical Exam:   Vitals  Blood pressure 102/63, pulse 95, temperature 98.1 F (36.7 C), temperature source Oral, resp. rate 20, SpO2 91 %.  1.  General: Appears in no acute distress  2. Psychiatric: Alert, oriented x3,  intact insight and judgment  3. Neurologic: Cranial nerves II through XII grossly intact, moving all extremities, no focal deficit noted  4. HEENMT:  Atraumatic normocephalic, extraocular muscles intact  5. Respiratory : Clear to auscultation bilaterally  6. Cardiovascular : S1-S2, regular, no murmur auscultated  7. Gastrointestinal:  Abdomen is soft, distended, nontender to palpation, no organomegaly, midline scar noted below umbilicus  8. Skin:  No rash noted      Data Review:    CBC Recent Labs  Lab 10/15/21 1213  WBC 11.1*  HGB 15.3*  HCT 46.7*  PLT 303  MCV 87.3  MCH 28.6  MCHC 32.8  RDW 13.4   ------------------------------------------------------------------------------------------------------------------  Results for orders placed or performed during the hospital encounter of 10/15/21 (from the past 48 hour(s))  CBC     Status: Abnormal   Collection Time: 10/15/21 12:13 PM  Result Value Ref Range   WBC 11.1 (H) 4.0 - 10.5 K/uL   RBC 5.35 (H) 3.87 - 5.11 MIL/uL   Hemoglobin 15.3 (H) 12.0 - 15.0 g/dL   HCT 46.7 (H) 36.0 - 46.0 %   MCV 87.3 80.0 - 100.0 fL   MCH 28.6 26.0 - 34.0 pg   MCHC 32.8 30.0 - 36.0 g/dL   RDW 13.4 11.5 - 15.5 %   Platelets 303 150 - 400 K/uL   nRBC 0.0 0.0 - 0.2 %    Comment: Performed at [facility], [place] [address],  [place], [place] [address]  Comprehensive metabolic panel     Status: Abnormal   Collection Time: 10/15/21 12:13 PM  Result Value Ref Range   Sodium 132 (L) 135 - 145 mmol/L   Potassium 3.9 3.5 - 5.1 mmol/L   Chloride 90 (L) 98 - 111 mmol/L   CO2 25 22 - 32 mmol/L   Glucose, Bld 407 (H) 70 - 99 mg/dL    Comment: Glucose reference range applies only to samples taken after fasting for at least 8 hours.   BUN 36 (H) 6 - 20 mg/dL   Creatinine, Ser 2.96 (H) 0.44 - 1.00 mg/dL   Calcium 10.0 8.9 - 10.3 mg/dL   Total Protein 8.2 (H) 6.5 - 8.1 g/dL   Albumin 4.0 3.5 - 5.0 g/dL   AST 21 15 - 41 U/L   ALT 15 0 - 44 U/L   Alkaline Phosphatase 82 38 - 126 U/L   Total Bilirubin 0.8 0.3 - 1.2 mg/dL   GFR, Estimated 18 (L) >60 mL/min    Comment: (NOTE) Calculated using the CKD-EPI Creatinine Equation (2021)    Anion gap 17 (H) 5 - 15    Comment: Performed at [facility], [place] [address] [place], [place] [address]  Beta-hydroxybutyric acid     Status: Abnormal   Collection Time: 10/15/21 12:13 PM  Result Value Ref Range   Beta-Hydroxybutyric Acid 0.42 (H) 0.05 - 0.27 mmol/L    Comment: Performed at [facility] [address] [place], [place] [address]  POC CBG, ED     Status: Abnormal   Collection Time: 10/15/21 12:15 PM  Result Value Ref Range   Glucose-Capillary 405 (H) 70 - 99 mg/dL    Comment: Glucose reference range applies only to samples taken after fasting for at least 8 hours.  I-Stat beta hCG blood, ED ([facility], [facility], [facility] only)     Status: None   Collection Time: 10/15/21 12:27 PM  Result Value Ref Range   I-stat hCG, quantitative <5.0 <5  mIU/mL   Comment 3            Comment:   GEST. AGE      CONC.  (mIU/mL)   <=1 WEEK        5 - 50     2 WEEKS       50 - 500     3 WEEKS       100 - 10,000     4 WEEKS     1,000 - 30,000        FEMALE AND NON-PREGNANT FEMALE:     LESS THAN 5 mIU/mL   CBG monitoring, ED     Status: Abnormal   Collection Time: 10/15/21  4:15 PM  Result Value  Ref Range   Glucose-Capillary 375 (H) 70 - 99 mg/dL    Comment: Glucose reference range applies only to samples taken after fasting for at least 8 hours.   *Note: Due to a large number of results and/or encounters for the requested time period, some results have not been displayed. A complete set of results can be found in Results Review.    Chemistries  Recent Labs  Lab 10/15/21 1213  NA 132*  K 3.9  CL 90*  CO2 25  GLUCOSE 407*  BUN 36*  CREATININE 2.96*  CALCIUM 10.0  AST 21  ALT 15  ALKPHOS 82  BILITOT 0.8   ------------------------------------------------------------------------------------------------------------------  ------------------------------------------------------------------------------------------------------------------ GFR: CrCl cannot be calculated (Unknown ideal weight.). Liver Function Tests: Recent Labs  Lab 10/15/21 1213  AST 21  ALT 15  ALKPHOS 82  BILITOT 0.8  PROT 8.2*  ALBUMIN 4.0   No results for input(s): "LIPASE", "AMYLASE" in the last 168 hours. No results for input(s): "AMMONIA" in the last 168 hours. Coagulation Profile: No results for input(s): "INR", "PROTIME" in the last 168 hours. Cardiac Enzymes: No results for input(s): "CKTOTAL", "CKMB", "CKMBINDEX", "TROPONINI" in the last 168 hours. BNP (last 3 results) No results for input(s): "PROBNP" in the last 8760 hours. HbA1C: No results for input(s): "HGBA1C" in the last 72 hours. CBG: Recent Labs  Lab 10/15/21 1215 10/15/21 1615  GLUCAP 405* 375*   Lipid Profile: No results for input(s): "CHOL", "HDL", "LDLCALC", "TRIG", "CHOLHDL", "LDLDIRECT" in the last 72 hours. Thyroid Function Tests: No results for input(s): "TSH", "T4TOTAL", "FREET4", "T3FREE", "THYROIDAB" in the last 72 hours. Anemia Panel: No results for input(s): "VITAMINB12", "FOLATE", "FERRITIN", "TIBC", "IRON", "RETICCTPCT" in the last 72  hours.  --------------------------------------------------------------------------------------------------------------- Urine analysis:    Component Value Date/Time   COLORURINE YELLOW (A) 07/07/2021 1005   APPEARANCEUR CLOUDY (A) 07/07/2021 1005   LABSPEC 1.020 07/07/2021 1005   PHURINE 5.0 07/07/2021 1005   GLUCOSEU >=500 (A) 07/07/2021 1005   GLUCOSEU NEGATIVE 09/21/2019 1038   HGBUR SMALL (A) 07/07/2021 1005   HGBUR negative 09/30/2006 1400   BILIRUBINUR NEGATIVE 07/07/2021 1005   BILIRUBINUR negative 06/04/2020 1535   KETONESUR NEGATIVE 07/07/2021 1005   PROTEINUR NEGATIVE 07/07/2021 1005   UROBILINOGEN 0.2 06/04/2020 1535   UROBILINOGEN 0.2 09/21/2019 1038   NITRITE POSITIVE (A) 07/07/2021 1005   LEUKOCYTESUR MODERATE (A) 07/07/2021 1005      Imaging Results:    CT Renal Stone Study  Result Date: 10/15/2021 CLINICAL DATA:  New acute kidney injury, history of kidney stones, fell today with head injury, takes Plavix, history diabetes mellitus, flank pain EXAM: CT ABDOMEN AND PELVIS WITHOUT CONTRAST TECHNIQUE: Multidetector CT imaging of the abdomen and pelvis was performed following the standard protocol without IV contrast.  RADIATION DOSE REDUCTION: This exam was performed according to the departmental dose-optimization program which includes automated exposure control, adjustment of the mA and/or kV according to patient size and/or use of iterative reconstruction technique. COMPARISON:  08/07/2021 FINDINGS: Lower chest: Minimal dependent atelectasis at lung bases Hepatobiliary: Gallbladder and liver normal appearance Pancreas: Normal appearance Spleen: Normal appearance Adrenals/Urinary Tract: LEFT adrenal gland normal. RIGHT adrenal mass 2.7 x 1.8 cm, average attenuation 9 HU consistent with lipid rich adenoma; no follow-up imaging recommended. Small BILATERAL nonobstructing renal calculi. No renal masses, hydronephrosis, hydroureter, or ureteral calcification. Bladder  unremarkable. Stomach/Bowel: Sigmoid diverticulosis without evidence of diverticulitis. Scattered stool throughout colon. Normal appendix. Stomach and bowel loops otherwise normal appearance. Vascular/Lymphatic: Aorta normal caliber.  No adenopathy. Reproductive: Uterus surgically absent.  Normal appearing ovaries. Other: No free air or free fluid. Ventral surgical scar at midline. No hernia. Musculoskeletal: Wire versus tubing fragment superficial to RIGHT latissimus unchanged. No fractures. IMPRESSION: Small BILATERAL nonobstructing renal calculi. Sigmoid diverticulosis without evidence of diverticulitis. Lipid rich RIGHT adrenal adenoma 2.7 x 1.8 cm; no follow-up imaging recommended. No acute intra-abdominal or intrapelvic abnormalities. Electronically Signed   By: Lavonia Dana M.D.   On: 10/15/2021 17:11   DG Wrist Complete Left  Result Date: 10/15/2021 CLINICAL DATA:  Trauma, fall, pain EXAM: LEFT WRIST - COMPLETE 3+ VIEW COMPARISON:  None Available. FINDINGS: No fracture or dislocation is seen. There are no opaque foreign bodies. IMPRESSION: No fracture or dislocation is seen in left wrist. Electronically Signed   By: Elmer Picker M.D.   On: 10/15/2021 14:05   CT Head Wo Contrast  Result Date: 10/15/2021 CLINICAL DATA:  Provided history: Head trauma, intracranial venous injury suspected. Neck trauma, midline tenderness. EXAM: CT HEAD WITHOUT CONTRAST CT CERVICAL SPINE WITHOUT CONTRAST TECHNIQUE: Multidetector CT imaging of the head and cervical spine was performed following the standard protocol without intravenous contrast. Multiplanar CT image reconstructions of the cervical spine were also generated. RADIATION DOSE REDUCTION: This exam was performed according to the departmental dose-optimization program which includes automated exposure control, adjustment of the mA and/or kV according to patient size and/or use of iterative reconstruction technique. COMPARISON:  Head CT 07/07/2021. Cervical  spine MRI 02/03/2021. FINDINGS: CT HEAD FINDINGS Brain: Mild generalized parenchymal atrophy. Prominent dural calcifications. There is no acute intracranial hemorrhage. No demarcated cortical infarct. No extra-axial fluid collection. No evidence of an intracranial mass. No midline shift. Vascular: No hyperdense vessel. Atherosclerotic calcifications. Skull: No fracture or aggressive osseous lesion. Redemonstrated small well-corticated defect within the right parietal calvarium, which may reflect sequela of a prior craniotomy. Sinuses/Orbits: No orbital mass or acute orbital finding. Left greater than right proptosis. No significant paranasal sinus disease at the imaged levels. CT CERVICAL SPINE FINDINGS Alignment: Straightening of the expected cervical lordosis. No significant spondylolisthesis. Skull base and vertebrae: The basion-dental and atlanto-dental intervals are maintained.No evidence of acute fracture to the cervical spine. 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 Soft tissues and spinal canal: No prevertebral fluid or swelling. No visible canal hematoma. Disc levels: Cervical spondylosis with multilevel disc space narrowing, disc bulges/central disc protrusions and uncovertebral hypertrophy. Disc space narrowing is greatest at C4-C5, C5-C6 and C6-C7 (moderate at these levels). Multilevel spinal canal narrowing. Most notably at C5-C6, a disc protrusion contributes to at least moderate spinal canal stenosis. Bilateral bony neural foraminal narrowing also present at this level. Ventral osteophytes at C5-C6 and C6-C7. Upper chest: No consolidation within the imaged lung apices. No visible pneumothorax. IMPRESSION:  CT head: 1. No evidence of acute intracranial abnormality. 2. Mild generalized parenchymal atrophy. 3. Left greater than right proptosis. CT cervical spine: 1. No evidence of acute fracture to the cervical spine. 2. Nonspecific straightening of the expected cervical lordosis. 3.  Cervical spondylosis. Most notably at C5-C6, a disc protrusion contributes to at least moderate spinal canal stenosis. Electronically Signed   By: Kellie Simmering D.O.   On: 10/15/2021 13:20   CT Cervical Spine Wo Contrast  Result Date: 10/15/2021 CLINICAL DATA:  Provided history: Head trauma, intracranial venous injury suspected. Neck trauma, midline tenderness. EXAM: CT HEAD WITHOUT CONTRAST CT CERVICAL SPINE WITHOUT CONTRAST TECHNIQUE: Multidetector CT imaging of the head and cervical spine was performed following the standard protocol without intravenous contrast. Multiplanar CT image reconstructions of the cervical spine were also generated. RADIATION DOSE REDUCTION: This exam was performed according to the departmental dose-optimization program which includes automated exposure control, adjustment of the mA and/or kV according to patient size and/or use of iterative reconstruction technique. COMPARISON:  Head CT 07/07/2021. Cervical spine MRI 02/03/2021. FINDINGS: CT HEAD FINDINGS Brain: Mild generalized parenchymal atrophy. Prominent dural calcifications. There is no acute intracranial hemorrhage. No demarcated cortical infarct. No extra-axial fluid collection. No evidence of an intracranial mass. No midline shift. Vascular: No hyperdense vessel. Atherosclerotic calcifications. Skull: No fracture or aggressive osseous lesion. Redemonstrated small well-corticated defect within the right parietal calvarium, which may reflect sequela of a prior craniotomy. Sinuses/Orbits: No orbital mass or acute orbital finding. Left greater than right proptosis. No significant paranasal sinus disease at the imaged levels. CT CERVICAL SPINE FINDINGS Alignment: Straightening of the expected cervical lordosis. No significant spondylolisthesis. Skull base and vertebrae: The basion-dental and atlanto-dental intervals are maintained.No evidence of acute fracture to the cervical spine. 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 Soft tissues and spinal canal: No prevertebral fluid or swelling. No visible canal hematoma. Disc levels: Cervical spondylosis with multilevel disc space narrowing, disc bulges/central disc protrusions and uncovertebral hypertrophy. Disc space narrowing is greatest at C4-C5, C5-C6 and C6-C7 (moderate at these levels). Multilevel spinal canal narrowing. Most notably at C5-C6, a disc protrusion contributes to at least moderate spinal canal stenosis. Bilateral bony neural foraminal narrowing also present at this level. Ventral osteophytes at C5-C6 and C6-C7. Upper chest: No consolidation within the imaged lung apices. No visible pneumothorax. IMPRESSION: CT head: 1. No evidence of acute intracranial abnormality. 2. Mild generalized parenchymal atrophy. 3. Left greater than right proptosis. CT cervical spine: 1. No evidence of acute fracture to the cervical spine. 2. Nonspecific straightening of the expected cervical lordosis. 3. Cervical spondylosis. Most notably at C5-C6, a disc protrusion contributes to at least moderate spinal canal stenosis. Electronically Signed   By: Kellie Simmering D.O.   On: 10/15/2021 13:20   CT Lumbar Spine Wo Contrast  Result Date: 10/15/2021 CLINICAL DATA:  Fall, back injury EXAM: CT LUMBAR SPINE WITHOUT CONTRAST TECHNIQUE: Multidetector CT imaging of the lumbar spine was performed without intravenous contrast administration. Multiplanar CT image reconstructions were also generated. RADIATION DOSE REDUCTION: This exam was performed according to the departmental dose-optimization program which includes automated exposure control, adjustment of the mA and/or kV according to patient size and/or use of iterative reconstruction technique. COMPARISON:  None Available. FINDINGS: Segmentation: 5 lumbar vertebra Alignment: Negative for fracture Vertebrae:  Negative for fracture or mass Paraspinal and other soft tissues: Negative for paraspinous mass or adenopathy. 10 mm right lower pole  calculus. Tiny left lower pole calculus. No  hydronephrosis. Disc levels: L1-2: Disc degeneration with prominent posterior osteophyte causing mild spinal stenosis. Mild facet degeneration L2-3: Small calcified disc protrusion on the right. No significant stenosis L3-4: Mild disc and facet degeneration.  Negative for stenosis. L4-5: Mild disc bulging and facet degeneration. Negative for stenosis L5-S1: Mild facet degeneration.  Negative for stenosis IMPRESSION: Negative for lumbar fracture. Electronically Signed   By: Franchot Gallo M.D.   On: 10/15/2021 13:10    My personal review of EKG: Rhythm NSR, no ST changes   Assessment & Plan:    Principal Problem:   Acute kidney injury ([place])   Acute kidney injury-likely from dehydration, poor p.o. intake.  Patient also taking Benicar HCT at home.  She has not made urine in the past 24 hours as per patient.?  Oliguric AKI.  Will insert Foley catheter, check urine sodium and urine creatinine.  Patient has received IV fluid boluses in the ED.  We will continue LR at 100 ml/h.  Monitor strict intake and output.  Follow renal function in a.m. Syncope-patient likely had syncope in the setting of acute kidney injury/dehydration as above.  Will check orthostatic vital signs every 4 hours x3.  Continue monitoring on telemetry. History of seizure-patient is on Keppra 500 mg in the morning and 750 mg at bedtime.  We will continue with Keppra at home dose.  Due to high likelihood of seizure, will obtain EEG. Status post fall-secondary to seizure versus syncope.  All the imaging studies are negative. History of stroke-stable, no neurological deficit.  Continue Plavix. Diabetes mellitus type 2-patient says that her blood sugar usually runs between 203 100, if it falls less than 200 then she starts having hypoglycemic symptoms.  Will start sliding scale insulin with NovoLog, resistant sliding scale.  Check CBG before every meal and at bedtime. History of hypertension-we  will hold Coreg, Benicar HCT, Aldactone as patient is hypotensive at this time. Hyperlipidemia-we will hold Crestor at this time    DVT Prophylaxis-   heparin  AM Labs Ordered, also please review Full Orders  Family Communication: Admission, patients condition and plan of care including tests being ordered have been discussed with the patient who indicate understanding and agree with the plan and Code Status.  Code Status:  full code  Admission status: Inpatient :The appropriate admission status for this patient is INPATIENT. Inpatient status is judged to be reasonable and necessary in order to provide the required intensity of service to ensure the patient's safety. The patient's presenting symptoms, physical exam findings, and initial radiographic and laboratory data in the context of their chronic comorbidities is felt to place them at high risk for further clinical deterioration. Furthermore, it is not anticipated that the patient will be medically stable for discharge from the hospital within 2 midnights of admission. The following factors support the admission status of inpatient.          Time spent in minutes : 60 min   Susi Goslin S Cleston Lautner M.D

## 2021-10-15 NOTE — ED Notes (Signed)
Patient transported to X-ray 

## 2021-10-15 NOTE — Telephone Encounter (Signed)
FYI

## 2021-10-15 NOTE — ED Provider Notes (Signed)
Care transferred to me.  Patient's CT renal stone study reviewed/interpreted by myself.  No hydronephrosis or stone.  Patient tells me she has been urinating less over the last few days and having trouble with bowel movements.  Will admit as per prior plan with Dr. Maryan Rued for AKI.  Has been started on IV fluids.  Dr. Darrick Meigs will admit.   Sherwood Gambler, MD 10/15/21 1736

## 2021-10-15 NOTE — Inpatient Diabetes Management (Addendum)
Inpatient Diabetes Program Recommendations  AACE/ADA: New Consensus Statement on Inpatient Glycemic Control (2015)  Target Ranges:  Prepandial:   less than 140 mg/dL      Peak postprandial:   less than 180 mg/dL (1-2 hours)      Critically ill patients:  140 - 180 mg/dL   Lab Results  Component Value Date   GLUCAP 405 (H) 10/15/2021   HGBA1C 8 07/26/2021    Review of Glycemic Control  Latest Reference Range & Units 10/15/21 12:15  Glucose-Capillary 70 - 99 mg/dL 405 (H)   Diabetes history: DM 2 Outpatient Diabetes medications: per pt report only taking Humalog SSI and Trulicity 4.5 mg every week, Farxiga 10 mg Daily Current orders for Inpatient glycemic control:  Being evaluated in ED  Per Endocrinology notes pt should be on Omnipod insulin pump for basal insulin and Humalog SSI for bolus due to pump under bolusing glucose and carbs. Last Endocrinology visit on 6/26  Spoke with pt at bedside. She currently has a Dexcom CGM placed on left abd and was placed there on 9/25. The Dexcom is scheduled to be changed every 10 days. Pt reports feeling hypoglycemia below 200. Pt also states she will pass out at 150 mg/dl. Discussed importance of glucose control and the need to lower her normal range down. Pt reports a history of previous strokes.   Pt was hospitalized in June for hypoglycemia at [facility] settings changed on inulin pump and she followed up with Endocrinologist within a week after discharge. Pt misunderstood directions from her Endocrinologist and completely stopped her insulin pump and was only using a Humalog SSI, Trulicity, and Farxiga medications at home.  Inpatient Diabetes Program Recommendations:    -  Semglee 40 units -  Novolog 0-9 units tid + hs  Thanks,  Tama Headings RN, MSN, BC-ADM Inpatient Diabetes Coordinator Team Pager [phone] (8a-5p)

## 2021-10-15 NOTE — ED Triage Notes (Signed)
[name] for hyperglycemia and a fall today with head injury, takes plavix, history of diabetes, has not taken insulin today, patient states her CBG was 160 this morning so she got up to get something sweet. Stroke in 2012 in left sided deficits, history of seizures last seizure 5 months ago.

## 2021-10-15 NOTE — ED Provider Triage Note (Signed)
Emergency Medicine Provider Triage Evaluation Note  [name] , a 56 y.o. female  was evaluated in triage.  Pt complains of hyperglycemia and a fall with injury.  Patient states her blood glucose was 160 this morning and she got up to get something sweet to eat.  She states that if her blood glucose gets below 200 she feels lightheaded.  She has not taken insulin today.  When she tried to get something to eat she fell, hitting her face.  She complains of neck pain, headache, and lumbar spine pain secondary to the fall.  She does have a history of back pain but states this is a new pain.  Patient does have a laceration above the left eye.  Patient takes Plavix.  History of stroke 2012 with left-sided deficits, history of seizures with last seizure being 5 months ago.  Review of Systems  Positive: As above Negative: As above  Physical Exam  BP (!) 82/63   Pulse 100   Temp 97.8 F (36.6 C) (Oral)   Resp 16   LMP  (LMP Unknown)   SpO2 96%  Gen:   Awake, no distress   Resp:  Normal effort  MSK:   Moves extremities without difficulty  Other:    Medical Decision Making  Medically screening exam initiated at 11:40 AM.  Appropriate orders placed.  [name] was informed that the remainder of the evaluation will be completed by another provider, this initial triage assessment does not replace that evaluation, and the importance of remaining in the ED until their evaluation is complete.     Dorothyann Peng, [name] 10/15/21 1142

## 2021-10-15 NOTE — ED Provider Notes (Signed)
[facility] EMERGENCY DEPARTMENT Provider Note   CSN: [id number] Arrival date & time: 10/15/21  1123     History  Chief Complaint  Patient presents with   [name] is a 56 y.o. female.  Patient is a 56 year old female with a history of hypertension, prior stroke with left-sided deficits, seizures on medication, COPD, diabetes, aortic root dilation, diastolic dysfunction and left-sided blindness who is presenting today after a fall as a level 2 trauma.  Patient does take Plavix and reports when she got up this morning she had checked her blood sugar and it was 160.  She reports anytime it drops below 200 she feels a bit shaky.  She had gotten up to go get something to eat when she fell forward hitting her face on the floor.  She does remember falling and just thinks her legs gave out.  She cannot recall tripping on anything.  After falling she is unsure if she had any loss of consciousness.  She denies biting her tongue or having urinary incontinence.  Since the fall she has had a headache, facial pain, neck pain, back pain and left wrist pain.  She also reports a little bit of blurry vision in her good eye on the right which she states usually occurs when her blood sugar is elevated.  She has not had any nausea, vomiting, diarrhea or fever recently.  She denies any issues with her legs.  The history is provided by the patient.  Fall       Home Medications Prior to Admission medications   Medication Sig Start Date End Date Taking? Authorizing Provider  albuterol (ACCUNEB) 1.25 MG/3ML nebulizer solution Inhale 1 ampule into the lungs every 6 (six) hours as needed for wheezing or shortness of breath.    [provider]  albuterol (PROAIR HFA) 108 (90 Base) MCG/ACT inhaler INHALE 2 PUFFS BY MOUTH EVERY 6 HOURS AS NEEDED FOR WHEEZING OR SHORTNESS OF BREATH 01/24/18   Hubbard Hartshorn, [name]  amitriptyline (ELAVIL) 25 MG tablet Take 2-3 tablets  (50-75 mg total) by mouth at bedtime. 01/07/21 07/06/21  Gillis Santa, MD  baclofen (LIORESAL) 10 MG tablet Take 1 tablet (10 mg total) by mouth 3 (three) times daily as needed for muscle spasms. 06/04/21   Delsa Grana, [name]  bisacodyl (DULCOLAX) 5 MG EC tablet Take 1 tablet (5 mg total) by mouth daily. 05/24/21   Nicole Kindred A, DO  budesonide (PULMICORT) 0.5 MG/2ML nebulizer solution Inhale 0.5 mg into the lungs 2 (two) times daily.    Ottie Glazier, MD  butalbital-acetaminophen-caffeine [facility]) [id number] MG tablet Take 1-2 tablets by mouth every 6 (six) hours as needed for headache. 03/10/21 03/10/22  Gillis Santa, MD  carvedilol (COREG) 25 MG tablet Take 25 mg by mouth 2 (two) times daily. 03/06/21   [provider]  cholecalciferol (VITAMIN D3) 25 MCG (1000 UNIT) tablet Take 1,000 Units by mouth daily.    [provider]  ciclopirox (PENLAC) 8 % solution Apply topically at bedtime. 02/01/21   [provider]  clopidogrel (PLAVIX) 75 MG tablet Take 1 tablet (75 mg total) by mouth daily. 10/10/19   Towanda Malkin, MD  cycloSPORINE (RESTASIS) 0.05 % ophthalmic emulsion Place 2 drops into both eyes 2 (two) times daily.    [provider]  Dulaglutide (TRULICITY) 4.5 KY/7.0WC SOPN Inject 4.5 mg as directed once a week. 10/22/20   Elayne Snare, MD  DULoxetine (CYMBALTA) 60 MG capsule  Take 1 capsule (60 mg total) by mouth daily. 08/14/21   Bo Merino, [name]  EPINEPHrine 0.3 mg/0.3 mL IJ SOAJ injection Inject 0.3 mg into the muscle as needed for anaphylaxis. 03/29/19   [provider]  ezetimibe (ZETIA) 10 MG tablet Take 1 tablet (10 mg total) by mouth daily. 08/14/21   Bo Merino, [name]  fluticasone (FLONASE) 50 MCG/ACT nasal spray Place 2 sprays into both nostrils daily. 04/18/19   Towanda Malkin, MD  gabapentin (NEURONTIN) 600 MG tablet 600 mg qAM, 600 mg qPM, 1200 mg QHS 01/07/21   Lateef, Bilal, MD  GVOKE HYPOPEN 2-PACK 1 MG/0.2ML  SOAJ Inject 1 mg into the skin once as needed for up to 1 dose (for hypoglycemia secondary to insulin). 07/01/21   Delsa Grana, [name]  HUMALOG KWIKPEN 200 UNIT/ML KwikPen INJECT UP TO 160 UNITS DAILY IN DIVIDED DOSES AS DIRECTED 08/01/21   Elayne Snare, MD  hydrALAZINE (APRESOLINE) 50 MG tablet Take 1 tablet by mouth 3 (three) times daily. 01/31/21   [provider]  Insulin Disposable Pump (OMNIPOD DASH PODS, GEN 4,) MISC Change pod once daily 07/01/21   Elayne Snare, MD  Insulin Pen Needle (PEN NEEDLES) 32G X 4 MM MISC Use with Humalog to fill pump daily Patient not taking: Reported on 08/14/2021 05/14/21   Elayne Snare, MD  levETIRAcetam (KEPPRA) 500 MG tablet Take 500-750 mg by mouth See admin instructions. Take 1 tablet (515m) by mouth every morning and take 1 tablets (7564m by mouth every night    [provider]  meclizine (ANTIVERT) 25 MG tablet Take 1 tablet (25 mg total) by mouth 2 (two) times daily as needed for dizziness. 05/23/21   GrEzekiel SlocumbDO  mirabegron ER (MYRBETRIQ) 50 MG TB24 tablet Take 1 tablet (50 mg total) by mouth daily. 08/20/20   RuMyles GipDO  nitroGLYCERIN (NITROSTAT) 0.4 MG SL tablet Place 0.4 mg under the tongue every 5 (five) minutes as needed for chest pain.    [provider]  olmesartan-hydrochlorothiazide (BENICAR HCT) 40-25 MG tablet TAKE 1 TABLET BY MOUTH EVERY DAY 08/01/21   TaDelsa GranaPA-C  ondansetron (ZOFRAN-ODT) 8 MG disintegrating tablet Take 1 tablet (8 mg total) by mouth every 8 (eight) hours as needed for nausea or vomiting. 05/23/21   GrEzekiel SlocumbDO  oxyCODONE-acetaminophen (PERCOCET/ROXICET) 5-325 MG tablet Take 1 tablet by mouth every 6 (six) hours as needed for moderate pain or severe pain. 06/19/21   PaFritzi MandesMD  pantoprazole (PROTONIX) 40 MG tablet Take 1 tablet (40 mg total) by mouth daily. 06/20/21   PaFritzi MandesMD  polyethylene glycol (MIRALAX / GLYCOLAX) 17 g packet Take 17 g by mouth daily as needed for  mild constipation. 06/01/19   TaDelsa GranaPA-C  potassium chloride SA (KLOR-CON M20) 20 MEQ tablet You have not needed potassium while in the hospital.  Hold until outpatient followup. Patient taking differently: Take 20 mEq by mouth 2 (two) times daily. 05/21/19   LaEnzo BiMD  rosuvastatin (CRESTOR) 10 MG tablet Take 1 tablet (10 mg total) by mouth daily. 08/14/21   PeBo MerinoFNP  senna-docusate (SENOKOT-S) 8.6-50 MG tablet Take 1 tablet by mouth 2 (two) times daily. 05/23/21   GrEzekiel SlocumbDO  sodium phosphate (FLEET) 7-19 GM/118ML ENEM Place 133 mLs (1 enema total) rectally daily as needed for severe constipation. 05/24/21   GrEzekiel SlocumbDO  spironolactone (ALDACTONE) 25 MG tablet Take 25 mg by  mouth daily.    [provider]  Grant Ruts INHUB 250-50 MCG/DOSE AEPB Inhale 1 puff into the lungs 2 (two) times daily. 01/22/20   [provider]      Allergies    Codeine and Lactose intolerance (gi)    Review of Systems   Review of Systems  Physical Exam Updated Vital Signs BP (!) 149/109 (BP Location: Left Arm)   Pulse (!) 101   Temp 98.1 F (36.7 C) (Oral)   Resp 20   LMP  (LMP Unknown)   SpO2 94%  Physical Exam Vitals and nursing note reviewed.  Constitutional:      General: She is not in acute distress.    Appearance: She is well-developed.  HENT:     Head: Normocephalic and atraumatic.   Eyes:     Pupils: Pupils are equal, round, and reactive to light.  Cardiovascular:     Rate and Rhythm: Normal rate and regular rhythm.     Heart sounds: Normal heart sounds. No murmur heard.    No friction rub.  Pulmonary:     Effort: Pulmonary effort is normal.     Breath sounds: Normal breath sounds. No wheezing or rales.  Abdominal:     General: Bowel sounds are normal. There is no distension.     Palpations: Abdomen is soft.     Tenderness: There is no abdominal tenderness. There is no guarding or rebound.  Musculoskeletal:        General: Tenderness  present. Normal range of motion.     Left wrist: Tenderness and bony tenderness present. No snuff box tenderness. Normal range of motion. Normal pulse.     Cervical back: Tenderness present.     Comments: No edema  Skin:    General: Skin is warm and dry.     Findings: No rash.  Neurological:     Mental Status: She is alert and oriented to person, place, and time.     Cranial Nerves: No cranial nerve deficit.     Comments: Decreased sensation in the left upper and lower extremity.  Motor is intact  Psychiatric:        Behavior: Behavior normal.     ED Results / Procedures / Treatments   Labs (all labs ordered are listed, but only abnormal results are displayed) Labs Reviewed  CBC - Abnormal; Notable for the following components:      Result Value   WBC 11.1 (*)    RBC 5.35 (*)    Hemoglobin 15.3 (*)    HCT 46.7 (*)    All other components within normal limits  COMPREHENSIVE METABOLIC PANEL - Abnormal; Notable for the following components:   Sodium 132 (*)    Chloride 90 (*)    Glucose, Bld 407 (*)    BUN 36 (*)    Creatinine, Ser 2.96 (*)    Total Protein 8.2 (*)    GFR, Estimated 18 (*)    Anion gap 17 (*)    All other components within normal limits  BETA-HYDROXYBUTYRIC ACID - Abnormal; Notable for the following components:   Beta-Hydroxybutyric Acid 0.42 (*)    All other components within normal limits  CBG MONITORING, ED - Abnormal; Notable for the following components:   Glucose-Capillary 405 (*)    All other components within normal limits  URINALYSIS, ROUTINE W REFLEX MICROSCOPIC  OSMOLALITY  CBG MONITORING, ED  I-STAT VENOUS BLOOD GAS, ED  I-STAT BETA HCG BLOOD, ED ([facility], [facility], [facility] ONLY)  CBG  MONITORING, ED    EKG EKG Interpretation  Date/Time:  Wednesday October 15 2021 11:30:29 EDT Ventricular Rate:  103 PR Interval:  152 QRS Duration: 78 QT Interval:  368 QTC Calculation: 482 R Axis:   -2 Text Interpretation: Sinus tachycardia Otherwise normal ECG  When compared with ECG of 07-Jul-2021 22:46, PREVIOUS ECG IS PRESENT Confirmed by Blanchie Dessert [phone]) on 10/15/2021 12:34:25 PM  Radiology DG Wrist Complete Left  Result Date: 10/15/2021 CLINICAL DATA:  Trauma, fall, pain EXAM: LEFT WRIST - COMPLETE 3+ VIEW COMPARISON:  None Available. FINDINGS: No fracture or dislocation is seen. There are no opaque foreign bodies. IMPRESSION: No fracture or dislocation is seen in left wrist. Electronically Signed   By: Elmer Picker M.D.   On: 10/15/2021 14:05   CT Head Wo Contrast  Result Date: 10/15/2021 CLINICAL DATA:  Provided history: Head trauma, intracranial venous injury suspected. Neck trauma, midline tenderness. EXAM: CT HEAD WITHOUT CONTRAST CT CERVICAL SPINE WITHOUT CONTRAST TECHNIQUE: Multidetector CT imaging of the head and cervical spine was performed following the standard protocol without intravenous contrast. Multiplanar CT image reconstructions of the cervical spine were also generated. RADIATION DOSE REDUCTION: This exam was performed according to the departmental dose-optimization program which includes automated exposure control, adjustment of the mA and/or kV according to patient size and/or use of iterative reconstruction technique. COMPARISON:  Head CT 07/07/2021. Cervical spine MRI 02/03/2021. FINDINGS: CT HEAD FINDINGS Brain: Mild generalized parenchymal atrophy. Prominent dural calcifications. There is no acute intracranial hemorrhage. No demarcated cortical infarct. No extra-axial fluid collection. No evidence of an intracranial mass. No midline shift. Vascular: No hyperdense vessel. Atherosclerotic calcifications. Skull: No fracture or aggressive osseous lesion. Redemonstrated small well-corticated defect within the right parietal calvarium, which may reflect sequela of a prior craniotomy. Sinuses/Orbits: No orbital mass or acute orbital finding. Left greater than right proptosis. No significant paranasal sinus disease at the  imaged levels. CT CERVICAL SPINE FINDINGS Alignment: Straightening of the expected cervical lordosis. No significant spondylolisthesis. Skull base and vertebrae: The basion-dental and atlanto-dental intervals are maintained.No evidence of acute fracture to the cervical spine. 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 Soft tissues and spinal canal: No prevertebral fluid or swelling. No visible canal hematoma. Disc levels: Cervical spondylosis with multilevel disc space narrowing, disc bulges/central disc protrusions and uncovertebral hypertrophy. Disc space narrowing is greatest at C4-C5, C5-C6 and C6-C7 (moderate at these levels). Multilevel spinal canal narrowing. Most notably at C5-C6, a disc protrusion contributes to at least moderate spinal canal stenosis. Bilateral bony neural foraminal narrowing also present at this level. Ventral osteophytes at C5-C6 and C6-C7. Upper chest: No consolidation within the imaged lung apices. No visible pneumothorax. IMPRESSION: CT head: 1. No evidence of acute intracranial abnormality. 2. Mild generalized parenchymal atrophy. 3. Left greater than right proptosis. CT cervical spine: 1. No evidence of acute fracture to the cervical spine. 2. Nonspecific straightening of the expected cervical lordosis. 3. Cervical spondylosis. Most notably at C5-C6, a disc protrusion contributes to at least moderate spinal canal stenosis. Electronically Signed   By: Kellie Simmering D.O.   On: 10/15/2021 13:20   CT Cervical Spine Wo Contrast  Result Date: 10/15/2021 CLINICAL DATA:  Provided history: Head trauma, intracranial venous injury suspected. Neck trauma, midline tenderness. EXAM: CT HEAD WITHOUT CONTRAST CT CERVICAL SPINE WITHOUT CONTRAST TECHNIQUE: Multidetector CT imaging of the head and cervical spine was performed following the standard protocol without intravenous contrast. Multiplanar CT image reconstructions of the cervical spine were also  generated. RADIATION DOSE  REDUCTION: This exam was performed according to the departmental dose-optimization program which includes automated exposure control, adjustment of the mA and/or kV according to patient size and/or use of iterative reconstruction technique. COMPARISON:  Head CT 07/07/2021. Cervical spine MRI 02/03/2021. FINDINGS: CT HEAD FINDINGS Brain: Mild generalized parenchymal atrophy. Prominent dural calcifications. There is no acute intracranial hemorrhage. No demarcated cortical infarct. No extra-axial fluid collection. No evidence of an intracranial mass. No midline shift. Vascular: No hyperdense vessel. Atherosclerotic calcifications. Skull: No fracture or aggressive osseous lesion. Redemonstrated small well-corticated defect within the right parietal calvarium, which may reflect sequela of a prior craniotomy. Sinuses/Orbits: No orbital mass or acute orbital finding. Left greater than right proptosis. No significant paranasal sinus disease at the imaged levels. CT CERVICAL SPINE FINDINGS Alignment: Straightening of the expected cervical lordosis. No significant spondylolisthesis. Skull base and vertebrae: The basion-dental and atlanto-dental intervals are maintained.No evidence of acute fracture to the cervical spine. 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 Soft tissues and spinal canal: No prevertebral fluid or swelling. No visible canal hematoma. Disc levels: Cervical spondylosis with multilevel disc space narrowing, disc bulges/central disc protrusions and uncovertebral hypertrophy. Disc space narrowing is greatest at C4-C5, C5-C6 and C6-C7 (moderate at these levels). Multilevel spinal canal narrowing. Most notably at C5-C6, a disc protrusion contributes to at least moderate spinal canal stenosis. Bilateral bony neural foraminal narrowing also present at this level. Ventral osteophytes at C5-C6 and C6-C7. Upper chest: No consolidation within the imaged lung apices. No visible pneumothorax. IMPRESSION:  CT head: 1. No evidence of acute intracranial abnormality. 2. Mild generalized parenchymal atrophy. 3. Left greater than right proptosis. CT cervical spine: 1. No evidence of acute fracture to the cervical spine. 2. Nonspecific straightening of the expected cervical lordosis. 3. Cervical spondylosis. Most notably at C5-C6, a disc protrusion contributes to at least moderate spinal canal stenosis. Electronically Signed   By: Kellie Simmering D.O.   On: 10/15/2021 13:20   CT Lumbar Spine Wo Contrast  Result Date: 10/15/2021 CLINICAL DATA:  Fall, back injury EXAM: CT LUMBAR SPINE WITHOUT CONTRAST TECHNIQUE: Multidetector CT imaging of the lumbar spine was performed without intravenous contrast administration. Multiplanar CT image reconstructions were also generated. RADIATION DOSE REDUCTION: This exam was performed according to the departmental dose-optimization program which includes automated exposure control, adjustment of the mA and/or kV according to patient size and/or use of iterative reconstruction technique. COMPARISON:  None Available. FINDINGS: Segmentation: 5 lumbar vertebra Alignment: Negative for fracture Vertebrae:  Negative for fracture or mass Paraspinal and other soft tissues: Negative for paraspinous mass or adenopathy. 10 mm right lower pole calculus. Tiny left lower pole calculus. No hydronephrosis. Disc levels: L1-2: Disc degeneration with prominent posterior osteophyte causing mild spinal stenosis. Mild facet degeneration L2-3: Small calcified disc protrusion on the right. No significant stenosis L3-4: Mild disc and facet degeneration.  Negative for stenosis. L4-5: Mild disc bulging and facet degeneration. Negative for stenosis L5-S1: Mild facet degeneration.  Negative for stenosis IMPRESSION: Negative for lumbar fracture. Electronically Signed   By: Franchot Gallo M.D.   On: 10/15/2021 13:10    Procedures Procedures    Medications Ordered in ED Medications  lactated ringers bolus 1,000 mL  (0 mLs Intravenous Stopped 10/15/21 1414)  morphine (PF) 4 MG/ML injection 4 mg (4 mg Intravenous Given 10/15/21 1413)  ondansetron (ZOFRAN) injection 4 mg (4 mg Intravenous Given 10/15/21 1412)  insulin aspart (novoLOG) injection 7 Units (7 Units Intravenous Given 10/15/21  1520)    ED Course/ Medical Decision Making/ A&P                           Medical Decision Making Amount and/or Complexity of Data Reviewed External Data Reviewed: notes. Labs: ordered. Decision-making details documented in ED Course. Radiology: ordered and independent interpretation performed. Decision-making details documented in ED Course. ECG/medicine tests: ordered and independent interpretation performed. Decision-making details documented in ED Course.  Risk Prescription drug management.   Pt with multiple medical problems and comorbidities and presenting today with a complaint that caries a high risk for morbidity and mortality.  Today as a level 2 trauma after a fall and facial injury on Plavix.  Patient is awake and alert and at her baseline.  She is hyperglycemic today with a sugar of 400 but has not had her insulin this morning.  She is complaining of headache, neck pain, back pain and left wrist pain.  Unclear what caused her fall.  She feels like her legs gave out but does have a history of seizure disorder is unsure if she had a seizure.  She has no tongue biting or incontinence.  She had no postictal phase reported by EMS.  She has been awake and alert with [name] and appropriate.  Patient's headache was not improved after Tylenol so she was given morphine.  I independently interpreted patient's labs and EKG.  EKG showed sinus tachycardia with no other acute findings.  CBC with minimal leukocytosis of 11 and hemoglobin of 16, CMP today with new AKI with creatinine of 2.96 from her baseline of 0.8 with a blood sugar of 407 and anion gap of 17.  Unclear the cause of patient's new AKI as she reports she still eating and  drinking normally.  She has not changed any medication and denies any NSAID use.  Patient does have a history of renal stones which she reports recently she has not been following with because of issues with transportation to see urology.  She is not complaining of specific stone pain but concern for possible obstruction.  She is denying any retention type symptoms.  Beta hydroxybutyrate mildly elevated 0.42.  I have independently visualized and interpreted pt's images today.  Head CT today without acute intracranial hemorrhage, cervical spine without fracture.  Lumbar imaging is negative.  Wrist imaging is also negative for acute fracture.  Given patient's new AKI she was hydrated with a liter of fluid we will recheck blood sugar but feel patient needs admission for further hydration and evaluation of AKI.  We will do renal stone study to ensure no obstructing renal stones.  After 1L pt's sugar on her monitor is 386.  She was given 7units of IV insulin.          Final Clinical Impression(s) / ED Diagnoses Final diagnoses:  Fall, initial encounter  Facial abrasion, initial encounter  AKI (acute kidney injury) ([place])  Hyperglycemia    Rx / DC Orders ED Discharge Orders     None         Blanchie Dessert, MD 10/15/21 1548

## 2021-10-15 NOTE — Telephone Encounter (Addendum)
0945: Called pt back, left VM to call back.  Chief Complaint: BS 168 Symptoms: States low for her, fell this AM, hit head and nose with lacerations. Frequency: This AM Pertinent Negatives: Patient denies  Disposition: [x] ED /[] Urgent Care (no appt availability in office) / [] Appointment(In office/virtual)/ []  [place] [facility]/ [] Home Care/ [] Refused Recommended Disposition /[] [place] [facility]/ []  Follow-up with PCP Additional Notes: Pt states she cannot get to ED until daughter returns home "I don't have a key to lock my door." Offered EMS, "I have to lock my door." States daughter will be home in 15 minutes. Pt placed on CB to ensure going to ED. Care advise per protocol provided, verbalizes understanding. Reason for Disposition  [1] Low blood sugar symptoms persist > 30 minutes AND [2] using low blood sugar Care Advice  Answer Assessment - Initial Assessment Questions 1. SYMPTOMS: "What symptoms are you concerned about?"     BS low at 168 2. ONSET:  "When did the symptoms start?"     This AM 3. BLOOD GLUCOSE: "What is your blood glucose level?"      168 4. USUAL RANGE: "What is your blood glucose level usually?" (e.g., usual fasting morning value, usual evening value)     Over 200 5. TYPE 1 or 2:  "Do you know what type of diabetes you have?"  (e.g., Type 1, Type 2, Gestational; doesn't know)       6. INSULIN: "Do you take insulin?" "What type of insulin(s) do you use? What is the mode of delivery? (syringe, pen; injection or pump) "When did you last give yourself an insulin dose?" (i.e., time or hours/minutes ago) "How much did you give?" (i.e., how many units)      7. DIABETES PILLS: "Do you take any pills for your diabetes?" If Yes, ask: "What is the name of the medicine(s) that you take for high blood sugar?"      8. OTHER SYMPTOMS: "Do you have any symptoms?" (e.g., fever, frequent urination, difficulty breathing, vomiting)     Fell 9. LOW BLOOD GLUCOSE TREATMENT:  "What have you done so far to treat the low blood glucose level?"      10. FOOD: "When did you last eat or drink?"       6-7 am this morning 11. ALONE: "Are you alone right now or is someone with you?"        Alone  Protocols used: Diabetes - Low Blood Sugar-A-AH

## 2021-10-16 ENCOUNTER — Inpatient Hospital Stay (HOSPITAL_COMMUNITY): Payer: Medicare Other

## 2021-10-16 DIAGNOSIS — S0081XA Abrasion of other part of head, initial encounter: Secondary | ICD-10-CM

## 2021-10-16 DIAGNOSIS — R569 Unspecified convulsions: Secondary | ICD-10-CM | POA: Diagnosis not present

## 2021-10-16 DIAGNOSIS — N179 Acute kidney failure, unspecified: Secondary | ICD-10-CM | POA: Diagnosis not present

## 2021-10-16 DIAGNOSIS — R55 Syncope and collapse: Secondary | ICD-10-CM | POA: Diagnosis not present

## 2021-10-16 DIAGNOSIS — W19XXXA Unspecified fall, initial encounter: Secondary | ICD-10-CM | POA: Diagnosis not present

## 2021-10-16 DIAGNOSIS — R739 Hyperglycemia, unspecified: Secondary | ICD-10-CM | POA: Diagnosis not present

## 2021-10-16 LAB — COMPREHENSIVE METABOLIC PANEL
ALT: 16 U/L (ref 0–44)
AST: 17 U/L (ref 15–41)
Albumin: 3.5 g/dL (ref 3.5–5.0)
Alkaline Phosphatase: 77 U/L (ref 38–126)
Anion gap: 13 (ref 5–15)
BUN: 31 mg/dL — ABNORMAL HIGH (ref 6–20)
CO2: 28 mmol/L (ref 22–32)
Calcium: 9.4 mg/dL (ref 8.9–10.3)
Chloride: 93 mmol/L — ABNORMAL LOW (ref 98–111)
Creatinine, Ser: 1.93 mg/dL — ABNORMAL HIGH (ref 0.44–1.00)
GFR, Estimated: 30 mL/min — ABNORMAL LOW (ref >60)
Glucose, Bld: 425 mg/dL — ABNORMAL HIGH (ref 70–99)
Potassium: 3.7 mmol/L (ref 3.5–5.1)
Sodium: 134 mmol/L — ABNORMAL LOW (ref 135–145)
Total Bilirubin: 0.7 mg/dL (ref 0.3–1.2)
Total Protein: 7.2 g/dL (ref 6.5–8.1)

## 2021-10-16 LAB — GLUCOSE, CAPILLARY
Glucose-Capillary: 212 mg/dL — ABNORMAL HIGH (ref 70–99)
Glucose-Capillary: 401 mg/dL — ABNORMAL HIGH (ref 70–99)
Glucose-Capillary: 417 mg/dL — ABNORMAL HIGH (ref 70–99)
Glucose-Capillary: 409 mg/dL — ABNORMAL HIGH (ref 70–99)

## 2021-10-16 LAB — CBC
HCT: 42.8 % (ref 36.0–46.0)
Hemoglobin: 13.7 g/dL (ref 12.0–15.0)
MCH: 28.6 pg (ref 26.0–34.0)
MCHC: 32 g/dL (ref 30.0–36.0)
MCV: 89.4 fL (ref 80.0–100.0)
Platelets: 250 10*3/uL (ref 150–400)
RBC: 4.79 MIL/uL (ref 3.87–5.11)
RDW: 13.7 % (ref 11.5–15.5)
WBC: 7.7 10*3/uL (ref 4.0–10.5)
nRBC: 0 % (ref 0.0–0.2)

## 2021-10-16 LAB — CBG MONITORING, ED
Glucose-Capillary: 436 mg/dL — ABNORMAL HIGH (ref 70–99)
Glucose-Capillary: 361 mg/dL — ABNORMAL HIGH (ref 70–99)

## 2021-10-16 MED ORDER — EZETIMIBE 10 MG PO TABS
10.0000 mg | ORAL_TABLET | Freq: Every day | ORAL | Status: DC
  Administered 2021-10-16 – 2021-10-21 (×6): 10 mg via ORAL
  Filled 2021-10-16 (×6): qty 1

## 2021-10-16 MED ORDER — INSULIN ASPART 100 UNIT/ML IJ SOLN
6.0000 [IU] | Freq: Once | INTRAMUSCULAR | Status: AC
  Administered 2021-10-16: 6 [IU] via SUBCUTANEOUS

## 2021-10-16 MED ORDER — ROSUVASTATIN CALCIUM 5 MG PO TABS
10.0000 mg | ORAL_TABLET | Freq: Every day | ORAL | Status: DC
  Administered 2021-10-16 – 2021-10-21 (×6): 10 mg via ORAL
  Filled 2021-10-16 (×6): qty 2

## 2021-10-16 MED ORDER — GABAPENTIN 600 MG PO TABS
600.0000 mg | ORAL_TABLET | Freq: Once | ORAL | Status: AC
  Administered 2021-10-16: 600 mg via ORAL
  Filled 2021-10-16: qty 1

## 2021-10-16 MED ORDER — SIMETHICONE 80 MG PO CHEW
80.0000 mg | CHEWABLE_TABLET | Freq: Four times a day (QID) | ORAL | Status: AC | PRN
  Administered 2021-10-16 – 2021-10-17 (×2): 80 mg via ORAL
  Filled 2021-10-16 (×2): qty 1

## 2021-10-16 MED ORDER — AMITRIPTYLINE HCL 50 MG PO TABS
50.0000 mg | ORAL_TABLET | Freq: Every day | ORAL | Status: AC
  Administered 2021-10-16: 50 mg via ORAL
  Filled 2021-10-16: qty 1

## 2021-10-16 MED ORDER — INSULIN GLARGINE-YFGN 100 UNIT/ML ~~LOC~~ SOLN
15.0000 [IU] | Freq: Every day | SUBCUTANEOUS | Status: DC
  Administered 2021-10-16: 15 [IU] via SUBCUTANEOUS
  Filled 2021-10-16 (×2): qty 0.15

## 2021-10-16 NOTE — ED Notes (Signed)
Dr. Darrick Meigs at bedside; aware of hyperglycemia

## 2021-10-16 NOTE — Progress Notes (Signed)
EEG complete - results pending 

## 2021-10-16 NOTE — ED Notes (Signed)
Breakfast Ordered 

## 2021-10-16 NOTE — ED Notes (Signed)
EEG at bedside.

## 2021-10-16 NOTE — Procedures (Signed)
Patient Name: [name]  MRN: [id number]  Epilepsy Attending: Lora Havens  Referring Physician/Provider: Oswald Hillock, MD  Date: 10/16/2021 Duration: 27.31 mins  Patient history: 56 year old female with history of seizures presented with syncope.  EEG to evaluate for seizure.  Level of alertness: Awake, asleep  AEDs during EEG study: LEV  Technical aspects: This EEG study was done with scalp electrodes positioned according to the 10-20 International system of electrode placement. Electrical activity was reviewed with band pass filter of 1-70Hz , sensitivity of 7 uV/mm, display speed of 22m/sec with a 60Hz  notched filter applied as appropriate. EEG data were recorded continuously and digitally stored.  Video monitoring was available and reviewed as appropriate.  Description: The posterior dominant rhythm consists of 8 Hz activity of moderate voltage (25-35 uV) seen predominantly in posterior head regions, symmetric and reactive to eye opening and eye closing. Sleep was characterized by vertex waves, sleep spindles (12 to 14 Hz), maximal frontocentral region. Hyperventilation and photic stimulation were not performed.     IMPRESSION: This study is within normal limits. No seizures or epileptiform discharges were seen throughout the recording.  A normal interictal EEG does not exclude nor support the diagnosis of epilepsy.  Crickett Abbett OBarbra Sarks

## 2021-10-16 NOTE — Progress Notes (Signed)
[facility]  PROGRESS NOTE  Yocelin Vanlue HQP:[id number] DOB: Jan 23, 1965 DOA: 10/15/2021 PCP: Bo Merino, [name]   Brief HPI:   56 y.o. female, with medical history of hypertension, prior stroke with left-sided deficits, seizures on medication, COPD, diabetes mellitus type 2, aortic root dilatation, diastolic dysfunction, left eye blindness came to ED after fall.  Patient was admitted with acute kidney injury, dehydration, syncope, possible seizure.    Subjective   Patient seen and examined, renal function slowly improving with IV fluids.  Feels more alert.   Assessment/Plan:     Acute kidney injury -Secondary to poor p.o. intake, diarrhea; creatinine peaked at 2.93 -Patient is also on Lasix 20 mg daily along with HCTZ 50 mg daily; both medications currently on hold -Renal function is improving, today creatinine is down to 1.93 -Continue LR at 100 ml/h -CT renal stone study was negative for hydronephrosis, hydroureter; only showed small nonobstructing renal stones bilaterally   Syncope /fall -Patient fell likely from syncope in the setting of dehydration as above -Orthostatic vital signs ordered, currently pending -Continue monitoring on telemetry  History of seizure -Patient is on Keppra at home -There was concern for seizure, EEG obtained-result is negative for epileptiform discharges -Continue Keppra  History of stroke -Continue Plavix  Diabetes mellitus type 2 -CBG is elevated; patient says her usual blood sugar runs around 200-300 -She becomes symptomatic with hypoglycemia if blood sugar less than 200 -Started on resistant sliding scale with NovoLog -We will add 15 units of Semglee today -Closely monitor patient's blood glucose in the hospital  History of hypertension -Coreg, Benicar HCT, Aldactone currently on hold due to hypotension  Hyperlipidemia -Restart Crestor, Zetia    Medications     ezetimibe  10 mg Oral Daily   heparin  5,000  Units Subcutaneous Q8H   insulin aspart  0-20 Units Subcutaneous TID WC   insulin aspart  0-5 Units Subcutaneous QHS   insulin glargine-yfgn  15 Units Subcutaneous Daily   levETIRAcetam  500 mg Oral QPC breakfast   levETIRAcetam  750 mg Oral QPM   rosuvastatin  10 mg Oral Daily   sodium chloride flush  3 mL Intravenous Q12H     Data Reviewed:   CBG:  Recent Labs  Lab 10/15/21 1215 10/15/21 1615 10/15/21 2225 10/16/21 0808 10/16/21 1101  GLUCAP 405* 375* 410* 436* 361*    SpO2: 96 %    Vitals:   10/16/21 0800 10/16/21 0815 10/16/21 0842 10/16/21 1100  BP: 116/74   125/84  Pulse: 87 98  97  Resp: (!) 24 14  18   Temp:   98.1 F (36.7 C)   TempSrc:   Oral   SpO2: 96% 95%  96%      Data Reviewed:  Basic Metabolic Panel: Recent Labs  Lab 10/15/21 1213 10/16/21 0828  NA 132* 134*  K 3.9 3.7  CL 90* 93*  CO2 25 28  GLUCOSE 407* 425*  BUN 36* 31*  CREATININE 2.96* 1.93*  CALCIUM 10.0 9.4    CBC: Recent Labs  Lab 10/15/21 1213 10/16/21 0828  WBC 11.1* 7.7  HGB 15.3* 13.7  HCT 46.7* 42.8  MCV 87.3 89.4  PLT 303 250    LFT Recent Labs  Lab 10/15/21 1213 10/16/21 0828  AST 21 17  ALT 15 16  ALKPHOS 82 77  BILITOT 0.8 0.7  PROT 8.2* 7.2  ALBUMIN 4.0 3.5     Antibiotics: Anti-infectives (From admission, onward)    None  DVT prophylaxis: Heparin  Code Status: Full code  Family Communication: No family at bedside   CONSULTS    Objective    Physical Examination:   General-appears in no acute distress Heart-S1-S2, regular, no murmur auscultated Lungs-clear to auscultation bilaterally, no wheezing or crackles auscultated Abdomen-soft, nontender, no organomegaly Extremities-no edema in the lower extremities Neuro-alert, oriented x3, no focal deficit noted  Status is: Inpatient:           Oswald Hillock   [facility] If 7PM-7AM, please contact night-coverage at [email or web address], Office   [phone]   10/16/2021, 1:10 PM  LOS: 1 day

## 2021-10-16 NOTE — Inpatient Diabetes Management (Signed)
Inpatient Diabetes Program Recommendations  AACE/ADA: New Consensus Statement on Inpatient Glycemic Control (2015)  Target Ranges:  Prepandial:   less than 140 mg/dL      Peak postprandial:   less than 180 mg/dL (1-2 hours)      Critically ill patients:  140 - 180 mg/dL   Lab Results  Component Value Date   GLUCAP 410 (H) 10/15/2021   HGBA1C 13.7 (H) 10/15/2021    Review of Glycemic Control  Latest Reference Range & Units 10/15/21 12:15  Glucose-Capillary 70 - 99 mg/dL 405 (H)   Diabetes history: DM 2 Outpatient Diabetes medications: per pt report only taking Humalog SSI and Trulicity 4.5 mg every week, Farxiga 10 mg Daily Current orders for Inpatient glycemic control:  Novolog 0-20 units tid + hs  Pt feels hypoglycemic under 200 mg/dl Glucose trends in 400 range.   Inpatient Diabetes Program Recommendations:    -  Semglee 40 units   Thanks,  Tama Headings RN, MSN, BC-ADM Inpatient Diabetes Coordinator Team Pager [phone] (8a-5p)

## 2021-10-16 NOTE — ED Notes (Signed)
Checked patient cgb it was 436 notified RN of blood sugar sat patient up in the bed to eat her breakfast patient has call bell in reach

## 2021-10-17 DIAGNOSIS — S0081XA Abrasion of other part of head, initial encounter: Secondary | ICD-10-CM | POA: Diagnosis not present

## 2021-10-17 DIAGNOSIS — R739 Hyperglycemia, unspecified: Secondary | ICD-10-CM | POA: Diagnosis not present

## 2021-10-17 DIAGNOSIS — W19XXXA Unspecified fall, initial encounter: Secondary | ICD-10-CM | POA: Diagnosis not present

## 2021-10-17 DIAGNOSIS — N179 Acute kidney failure, unspecified: Secondary | ICD-10-CM | POA: Diagnosis not present

## 2021-10-17 LAB — BASIC METABOLIC PANEL
Anion gap: 16 — ABNORMAL HIGH (ref 5–15)
BUN: 29 mg/dL — ABNORMAL HIGH (ref 6–20)
CO2: 19 mmol/L — ABNORMAL LOW (ref 22–32)
Calcium: 8.8 mg/dL — ABNORMAL LOW (ref 8.9–10.3)
Chloride: 97 mmol/L — ABNORMAL LOW (ref 98–111)
Creatinine, Ser: 1.46 mg/dL — ABNORMAL HIGH (ref 0.44–1.00)
GFR, Estimated: 42 mL/min — ABNORMAL LOW (ref >60)
Glucose, Bld: 344 mg/dL — ABNORMAL HIGH (ref 70–99)
Potassium: 4 mmol/L (ref 3.5–5.1)
Sodium: 132 mmol/L — ABNORMAL LOW (ref 135–145)

## 2021-10-17 LAB — GLUCOSE, CAPILLARY
Glucose-Capillary: 311 mg/dL — ABNORMAL HIGH (ref 70–99)
Glucose-Capillary: 337 mg/dL — ABNORMAL HIGH (ref 70–99)
Glucose-Capillary: 348 mg/dL — ABNORMAL HIGH (ref 70–99)
Glucose-Capillary: 299 mg/dL — ABNORMAL HIGH (ref 70–99)
Glucose-Capillary: 314 mg/dL — ABNORMAL HIGH (ref 70–99)
Glucose-Capillary: 332 mg/dL — ABNORMAL HIGH (ref 70–99)

## 2021-10-17 MED ORDER — LACTATED RINGERS IV SOLN: INTRAVENOUS | Status: DC

## 2021-10-17 MED ORDER — INSULIN GLARGINE-YFGN 100 UNIT/ML ~~LOC~~ SOLN
20.0000 [IU] | Freq: Two times a day (BID) | SUBCUTANEOUS | Status: DC
  Administered 2021-10-17 – 2021-10-21 (×8): 20 [IU] via SUBCUTANEOUS
  Filled 2021-10-17 (×10): qty 0.2

## 2021-10-17 MED ORDER — CHLORHEXIDINE GLUCONATE CLOTH 2 % EX PADS
6.0000 | MEDICATED_PAD | Freq: Every day | CUTANEOUS | Status: DC
  Administered 2021-10-17 – 2021-10-19 (×3): 6 via TOPICAL

## 2021-10-17 MED ORDER — INSULIN GLARGINE-YFGN 100 UNIT/ML ~~LOC~~ SOLN
20.0000 [IU] | Freq: Every day | SUBCUTANEOUS | Status: DC
  Administered 2021-10-17: 20 [IU] via SUBCUTANEOUS
  Filled 2021-10-17: qty 0.2

## 2021-10-17 MED ORDER — LACTATED RINGERS IV SOLN: INTRAVENOUS | Status: AC

## 2021-10-17 NOTE — Inpatient Diabetes Management (Signed)
Inpatient Diabetes Program Recommendations  AACE/ADA: New Consensus Statement on Inpatient Glycemic Control (2015)  Target Ranges:  Prepandial:   less than 140 mg/dL      Peak postprandial:   less than 180 mg/dL (1-2 hours)      Critically ill patients:  140 - 180 mg/dL   Lab Results  Component Value Date   GLUCAP 337 (H) 10/17/2021   HGBA1C 13.7 (H) 10/15/2021    Review of Glycemic Control  Latest Reference Range & Units 10/16/21 08:08 10/16/21 11:01 10/16/21 16:17 10/16/21 21:31 10/16/21 21:35 10/16/21 23:07 10/17/21 01:11 10/17/21 07:48  Glucose-Capillary 70 - 99 mg/dL 436 (H) 361 (H) 212 (H) 417 (H) 401 (H) 409 (H) 311 (H) 337 (H)    Diabetes history: DM 2 Outpatient Diabetes medications: per pt report only taking Humalog SSI and Trulicity 4.5 mg every week, Farxiga 10 mg Daily Current orders for Inpatient glycemic control:  Novolog 0-20 units tid + hs Semglee 20 units Daily  Pt feels hypoglycemic under 200 mg/dl Glucose trends in 300-400 range.   Inpatient Diabetes Program Recommendations:     Pt received a total of 68 units of Novolog in a 24 hour period -  Increase Semglee to 40 units   Thanks,  Tama Headings RN, MSN, BC-ADM Inpatient Diabetes Coordinator Team Pager [phone] (8a-5p)

## 2021-10-17 NOTE — Care Management Important Message (Signed)
Important Message  Patient Details  Name: [name] MRN: [id number] Date of Birth: 1965-09-24   Medicare Important Message Given:  Yes     Hannah Beat 10/17/2021, 3:40 PM

## 2021-10-17 NOTE — Evaluation (Signed)
Physical Therapy Evaluation Patient Details Name: [name] MRN: [id number] DOB: [birth year] Today's Date: 10/17/2021  History of Present Illness  Pt is a 56 y/o female admitted secondary to fall. Found to have AKI. PMH includes DM, CVA with L deficits, L eye blindness, seizures, and COPD.  Clinical Impression  Pt admitted secondary to problem above with deficits below. Reports feeling very weak and mildly dizzy throughout. Requiring min A to stand and transfer to chair this session. Pt reports she does not feel she will be able to do necessary tasks at home and reports her daughter is concerned about her returning home. Recommending SNF level therapies at d/c to address current deficits. Will continue to follow acutely.        Recommendations for follow up therapy are one component of a multi-disciplinary discharge planning process, led by the attending physician.  Recommendations may be updated based on patient status, additional functional criteria and insurance authorization.  Follow Up Recommendations Skilled nursing-short term rehab (<3 hours/day) Can patient physically be transported by private vehicle: Yes    Assistance Recommended at Discharge Intermittent Supervision/Assistance  Patient can return home with the following  A little help with walking and/or transfers;A little help with bathing/dressing/bathroom;Assistance with cooking/housework;Help with stairs or ramp for entrance;Assist for transportation    Equipment Recommendations None recommended by PT  Recommendations for Other Services       Functional Status Assessment Patient has had a recent decline in their functional status and demonstrates the ability to make significant improvements in function in a reasonable and predictable amount of time.     Precautions / Restrictions Precautions Precautions: Fall Precaution Comments: 3 falls within the last couple of months. Restrictions Weight Bearing  Restrictions: No      Mobility  Bed Mobility Overal bed mobility: Needs Assistance Bed Mobility: Supine to Sit     Supine to sit: Min assist     General bed mobility comments: Min A for trunk elevation to come to sitting.    Transfers Overall transfer level: Needs assistance Equipment used: Rolling walker (2 wheels) Transfers: Sit to/from Stand, Bed to chair/wheelchair/BSC Sit to Stand: Min assist Stand pivot transfers: Min assist         General transfer comment: Requiring assist for lift assist and steadying to stand and transfer to chair. Pt reports feeling very weak and slightly dizzy, so further mobility deferred.    Ambulation/Gait                  Stairs            Wheelchair Mobility    Modified Rankin (Stroke Patients Only)       Balance Overall balance assessment: Needs assistance Sitting-balance support: No upper extremity supported, Feet supported Sitting balance-Leahy Scale: Fair     Standing balance support: Bilateral upper extremity supported Standing balance-Leahy Scale: Poor Standing balance comment: Reliant on UE and external support                             Pertinent Vitals/Pain Pain Assessment Pain Assessment: No/denies pain    Home Living Family/patient expects to be discharged to:: Private residence Living Arrangements: Children Available Help at Discharge: Family Type of Home: House Home Access: Stairs to enter   Technical brewer of Steps: 3   Home Layout: One level Home Equipment: Cane - quad;Cane - single point;Rollator (4 wheels);BSC/3in1;Shower seat;Grab bars - tub/shower  Prior Function Prior Level of Function : Needs assist             Mobility Comments: relies on QC and rollator for mobility ADLs Comments: Family assists as needed with bathing/dressing.     Hand Dominance        Extremity/Trunk Assessment   Upper Extremity Assessment Upper Extremity Assessment:  Generalized weakness (LUE weakness at baseline secondary to previous CVA)    Lower Extremity Assessment Lower Extremity Assessment: Generalized weakness;LLE deficits/detail LLE Deficits / Details: LLE weakness at baseline secondary to CVA    Cervical / Trunk Assessment Cervical / Trunk Assessment: Normal  Communication   Communication: No difficulties  Cognition Arousal/Alertness: Awake/alert Behavior During Therapy: WFL for tasks assessed/performed Overall Cognitive Status: No family/caregiver present to determine baseline cognitive functioning                                          General Comments General comments (skin integrity, edema, etc.): Pt reports she does not feel comfortable going home and is fearful she will fall again.    Exercises     Assessment/Plan    PT Assessment Patient needs continued PT services  PT Problem List Decreased strength;Decreased activity tolerance;Decreased balance;Decreased mobility;Decreased knowledge of precautions;Decreased safety awareness       PT Treatment Interventions DME instruction;Gait training;Therapeutic activities;Functional mobility training;Therapeutic exercise;Balance training;Patient/family education    PT Goals (Current goals can be found in the Care Plan section)  Acute Rehab PT Goals Patient Stated Goal: to get stronger and stop falling PT Goal Formulation: With patient Time For Goal Achievement: 10/31/21 Potential to Achieve Goals: Good    Frequency Min 2X/week     Co-evaluation               AM-PAC PT "6 Clicks" Mobility  Outcome Measure Help needed turning from your back to your side while in a flat bed without using bedrails?: A Little Help needed moving from lying on your back to sitting on the side of a flat bed without using bedrails?: A Little Help needed moving to and from a bed to a chair (including a wheelchair)?: A Little Help needed standing up from a chair using your arms  (e.g., wheelchair or bedside chair)?: A Little Help needed to walk in hospital room?: A Lot Help needed climbing 3-5 steps with a railing? : Total 6 Click Score: 15    End of Session Equipment Utilized During Treatment: Gait belt Activity Tolerance: Patient limited by fatigue Patient left: in chair;with call bell/phone within reach Nurse Communication: Mobility status PT Visit Diagnosis: Unsteadiness on feet (R26.81);Muscle weakness (generalized) (M62.81)    Time: 4496-7591 PT Time Calculation (min) (ACUTE ONLY): 23 min   Charges:   PT Evaluation $PT Eval Moderate Complexity: 1 Mod PT Treatments $Therapeutic Activity: 8-22 mins        Lou Miner, DPT  Acute Rehabilitation Services  Office: [phone]   Rudean Hitt 10/17/2021, 10:51 AM

## 2021-10-17 NOTE — Progress Notes (Signed)
[facility]  PROGRESS NOTE  [name] EXH:[id number] DOB: [birth year] DOA: 10/15/2021 PCP: Bo Merino, [name]   Brief HPI:   56 y.o. female, with medical history of hypertension, prior stroke with left-sided deficits, seizures on medication, COPD, diabetes mellitus type 2, aortic root dilatation, diastolic dysfunction, left eye blindness came to ED after fall.  Patient was admitted with acute kidney injury, dehydration, syncope, possible seizure.    Subjective    Patient seen and examined, denies any complaints.  Renal function slowly improving.   Assessment/Plan:     Acute kidney injury -Secondary to poor p.o. intake, diarrhea; creatinine peaked at 2.93 -Patient is also on Lasix 20 mg daily along with HCTZ 50 mg daily; both medications currently on hold -Renal function is improving, today creatinine is down to 1.46 -Continue LR at 75 ML per hour -CT renal stone study was negative for hydronephrosis, hydroureter; only showed small nonobstructing renal stones bilaterally   Syncope /fall -Patient fell likely from syncope in the setting of dehydration as above -Orthostatic vital signs were negative -Continue monitoring on telemetry -We will obtain PT eval  History of seizure -Patient is on Keppra at home -There was concern for seizure, EEG obtained-result is negative for epileptiform discharges -Continue Keppra  History of stroke -Continue Plavix  Diabetes mellitus type 2 -CBG is elevated; patient says her usual blood sugar runs around 200-300 -She becomes symptomatic with hypoglycemia if blood sugar less than 200 -Started on resistant sliding scale with NovoLog -CBG still elevated above 300s -We will increase Semglee to 20 units subcu twice daily -Closely monitor patient's blood glucose in the hospital  History of hypertension -Coreg, Benicar HCT, Aldactone currently on hold due to hypotension  Hyperlipidemia -Restart Crestor,  Zetia    Medications     ezetimibe  10 mg Oral Daily   heparin  5,000 Units Subcutaneous Q8H   insulin aspart  0-20 Units Subcutaneous TID WC   insulin aspart  0-5 Units Subcutaneous QHS   insulin glargine-yfgn  20 Units Subcutaneous Daily   levETIRAcetam  500 mg Oral QPC breakfast   levETIRAcetam  750 mg Oral QPM   rosuvastatin  10 mg Oral Daily   sodium chloride flush  3 mL Intravenous Q12H     Data Reviewed:   CBG:  Recent Labs  Lab 10/16/21 2131 10/16/21 2135 10/16/21 2307 10/17/21 0111 10/17/21 0748  GLUCAP 417* 401* 409* 311* 337*    SpO2: 99 %    Vitals:   10/17/21 0502 10/17/21 0546 10/17/21 0625 10/17/21 0750  BP:   137/77 (!) 136/91  Pulse:  84 88 93  Resp: (!) 21 19 19 14   Temp:   98.2 F (36.8 C) 98.1 F (36.7 C)  TempSrc:   Oral Oral  SpO2: 93%  94% 99%      Data Reviewed:  Basic Metabolic Panel: Recent Labs  Lab 10/15/21 1213 10/16/21 0828 10/17/21 0302  NA 132* 134* 132*  K 3.9 3.7 4.0  CL 90* 93* 97*  CO2 25 28 19*  GLUCOSE 407* 425* 344*  BUN 36* 31* 29*  CREATININE 2.96* 1.93* 1.46*  CALCIUM 10.0 9.4 8.8*    CBC: Recent Labs  Lab 10/15/21 1213 10/16/21 0828  WBC 11.1* 7.7  HGB 15.3* 13.7  HCT 46.7* 42.8  MCV 87.3 89.4  PLT 303 250    LFT Recent Labs  Lab 10/15/21 1213 10/16/21 0828  AST 21 17  ALT 15 16  ALKPHOS 82 77  BILITOT 0.8  0.7  PROT 8.2* 7.2  ALBUMIN 4.0 3.5     Antibiotics: Anti-infectives (From admission, onward)    None        DVT prophylaxis: Heparin  Code Status: Full code  Family Communication: No family at bedside   CONSULTS    Objective    Physical Examination:  General-appears in no acute distress Heart-S1-S2, regular, no murmur auscultated Lungs-clear to auscultation bilaterally, no wheezing or crackles auscultated Abdomen-soft, nontender, no organomegaly Extremities-trace edema in the lower extremities Neuro-alert, oriented x3, no focal deficit  noted   Status is: Inpatient:           Oswald Hillock   [facility] If 7PM-7AM, please contact night-coverage at [email or web address], Office  [phone]   10/17/2021, 8:35 AM  LOS: 2 days

## 2021-10-18 ENCOUNTER — Inpatient Hospital Stay (HOSPITAL_COMMUNITY): Payer: Medicare Other

## 2021-10-18 DIAGNOSIS — R739 Hyperglycemia, unspecified: Secondary | ICD-10-CM | POA: Diagnosis not present

## 2021-10-18 DIAGNOSIS — N179 Acute kidney failure, unspecified: Secondary | ICD-10-CM | POA: Diagnosis not present

## 2021-10-18 DIAGNOSIS — W19XXXA Unspecified fall, initial encounter: Secondary | ICD-10-CM | POA: Diagnosis not present

## 2021-10-18 DIAGNOSIS — S0081XA Abrasion of other part of head, initial encounter: Secondary | ICD-10-CM | POA: Diagnosis not present

## 2021-10-18 LAB — GLUCOSE, CAPILLARY
Glucose-Capillary: 195 mg/dL — ABNORMAL HIGH (ref 70–99)
Glucose-Capillary: 209 mg/dL — ABNORMAL HIGH (ref 70–99)
Glucose-Capillary: 231 mg/dL — ABNORMAL HIGH (ref 70–99)
Glucose-Capillary: 244 mg/dL — ABNORMAL HIGH (ref 70–99)
Glucose-Capillary: 276 mg/dL — ABNORMAL HIGH (ref 70–99)
Glucose-Capillary: 327 mg/dL — ABNORMAL HIGH (ref 70–99)

## 2021-10-18 LAB — BASIC METABOLIC PANEL
Anion gap: 14 (ref 5–15)
BUN: 18 mg/dL (ref 6–20)
CO2: 28 mmol/L (ref 22–32)
Calcium: 9.8 mg/dL (ref 8.9–10.3)
Chloride: 94 mmol/L — ABNORMAL LOW (ref 98–111)
Creatinine, Ser: 1.2 mg/dL — ABNORMAL HIGH (ref 0.44–1.00)
GFR, Estimated: 53 mL/min — ABNORMAL LOW (ref >60)
Glucose, Bld: 300 mg/dL — ABNORMAL HIGH (ref 70–99)
Potassium: 3.5 mmol/L (ref 3.5–5.1)
Sodium: 136 mmol/L (ref 135–145)

## 2021-10-18 LAB — TROPONIN I (HIGH SENSITIVITY)
Troponin I (High Sensitivity): 9 ng/L (ref <18)
Troponin I (High Sensitivity): 9 ng/L (ref <18)

## 2021-10-18 LAB — MAGNESIUM: Magnesium: 2.1 mg/dL (ref 1.7–2.4)

## 2021-10-18 MED ORDER — HYDRALAZINE HCL 20 MG/ML IJ SOLN
10.0000 mg | Freq: Four times a day (QID) | INTRAMUSCULAR | Status: DC | PRN
  Administered 2021-10-19: 10 mg via INTRAVENOUS
  Filled 2021-10-18: qty 1

## 2021-10-18 MED ORDER — SODIUM CHLORIDE 0.9 % IV SOLN
12.5000 mg | Freq: Once | INTRAVENOUS | Status: AC
  Administered 2021-10-18: 12.5 mg via INTRAVENOUS
  Filled 2021-10-18: qty 0.5
  Filled 2021-10-18: qty 12.5

## 2021-10-18 MED ORDER — PANTOPRAZOLE SODIUM 40 MG PO TBEC
40.0000 mg | DELAYED_RELEASE_TABLET | Freq: Every day | ORAL | Status: DC
  Administered 2021-10-18 – 2021-10-21 (×4): 40 mg via ORAL
  Filled 2021-10-18 (×4): qty 1

## 2021-10-18 MED ORDER — POTASSIUM CHLORIDE IN NACL 20-0.9 MEQ/L-% IV SOLN: INTRAVENOUS | Status: DC

## 2021-10-18 MED ORDER — LACTATED RINGERS IV SOLN: INTRAVENOUS | Status: DC

## 2021-10-18 MED ORDER — POTASSIUM CHLORIDE IN NACL 20-0.9 MEQ/L-% IV SOLN
INTRAVENOUS | Status: AC
  Filled 2021-10-18 (×2): qty 1000

## 2021-10-18 MED ORDER — SIMETHICONE 80 MG PO CHEW
160.0000 mg | CHEWABLE_TABLET | Freq: Three times a day (TID) | ORAL | Status: AC
  Administered 2021-10-18 – 2021-10-20 (×7): 160 mg via ORAL
  Filled 2021-10-18 (×8): qty 2

## 2021-10-18 MED ORDER — SIMETHICONE 80 MG PO CHEW
80.0000 mg | CHEWABLE_TABLET | Freq: Four times a day (QID) | ORAL | Status: DC | PRN
  Administered 2021-10-18 – 2021-10-20 (×3): 80 mg via ORAL
  Filled 2021-10-18 (×3): qty 1

## 2021-10-18 NOTE — Progress Notes (Signed)
Pt is not feeling well at the moment and does not want to wear cpap. RT spoke to nurse, due to Pt BP being elevated and having a slightly blurred speech.

## 2021-10-18 NOTE — Plan of Care (Signed)
Problem: Education: Goal: Ability to describe self-care measures that may prevent or decrease complications (Diabetes Survival Skills Education) will improve 10/18/2021 1137 by Bridgette Habermann, RN Outcome: Progressing 10/18/2021 1136 by Bridgette Habermann, RN Outcome: Progressing Goal: Individualized Educational Video(s) 10/18/2021 1137 by Bridgette Habermann, RN Outcome: Progressing 10/18/2021 1136 by Bridgette Habermann, RN Outcome: Progressing   Problem: Coping: Goal: Ability to adjust to condition or change in health will improve 10/18/2021 1137 by Bridgette Habermann, RN Outcome: Progressing 10/18/2021 1136 by Bridgette Habermann, RN Outcome: Progressing   Problem: Fluid Volume: Goal: Ability to maintain a balanced intake and output will improve 10/18/2021 1137 by Bridgette Habermann, RN Outcome: Progressing 10/18/2021 1136 by Bridgette Habermann, RN Outcome: Progressing   Problem: Health Behavior/Discharge Planning: Goal: Ability to identify and utilize available resources and services will improve 10/18/2021 1137 by Bridgette Habermann, RN Outcome: Progressing 10/18/2021 1136 by Bridgette Habermann, RN Outcome: Progressing Goal: Ability to manage health-related needs will improve 10/18/2021 1137 by Bridgette Habermann, RN Outcome: Progressing 10/18/2021 1136 by Bridgette Habermann, RN Outcome: Progressing   Problem: Nutritional: Goal: Progress toward achieving an optimal weight will improve 10/18/2021 1137 by Bridgette Habermann, RN Outcome: Progressing 10/18/2021 1136 by Bridgette Habermann, RN Outcome: Progressing   Problem: Skin Integrity: Goal: Risk for impaired skin integrity will decrease 10/18/2021 1137 by Bridgette Habermann, RN Outcome: Progressing 10/18/2021 1136 by Bridgette Habermann, RN Outcome: Progressing   Problem: Tissue Perfusion: Goal: Adequacy of tissue perfusion will improve 10/18/2021 1137 by Bridgette Habermann, RN Outcome: Progressing 10/18/2021 1136 by Bridgette Habermann, RN Outcome:  Progressing   Problem: Education: Goal: Knowledge of General Education information will improve Description: Including pain rating scale, medication(s)/side effects and non-pharmacologic comfort measures 10/18/2021 1137 by Bridgette Habermann, RN Outcome: Progressing 10/18/2021 1136 by Bridgette Habermann, RN Outcome: Progressing   Problem: Health Behavior/Discharge Planning: Goal: Ability to manage health-related needs will improve 10/18/2021 1137 by Bridgette Habermann, RN Outcome: Progressing 10/18/2021 1136 by Bridgette Habermann, RN Outcome: Progressing   Problem: Clinical Measurements: Goal: Ability to maintain clinical measurements within normal limits will improve 10/18/2021 1137 by Bridgette Habermann, RN Outcome: Progressing 10/18/2021 1136 by Bridgette Habermann, RN Outcome: Progressing Goal: Will remain free from infection 10/18/2021 1137 by Bridgette Habermann, RN Outcome: Progressing 10/18/2021 1136 by Bridgette Habermann, RN Outcome: Progressing Goal: Diagnostic test results will improve 10/18/2021 1137 by Bridgette Habermann, RN Outcome: Progressing 10/18/2021 1136 by Bridgette Habermann, RN Outcome: Progressing Goal: Respiratory complications will improve 10/18/2021 1137 by Bridgette Habermann, RN Outcome: Progressing 10/18/2021 1136 by Bridgette Habermann, RN Outcome: Progressing Goal: Cardiovascular complication will be avoided 10/18/2021 1137 by Bridgette Habermann, RN Outcome: Progressing 10/18/2021 1136 by Bridgette Habermann, RN Outcome: Progressing   Problem: Activity: Goal: Risk for activity intolerance will decrease 10/18/2021 1137 by Bridgette Habermann, RN Outcome: Progressing 10/18/2021 1136 by Bridgette Habermann, RN Outcome: Progressing   Problem: Nutrition: Goal: Adequate nutrition will be maintained 10/18/2021 1137 by Bridgette Habermann, RN Outcome: Progressing 10/18/2021 1136 by Bridgette Habermann, RN Outcome: Progressing   Problem: Coping: Goal: Level of anxiety will decrease 10/18/2021 1137  by Bridgette Habermann, RN Outcome: Progressing 10/18/2021 1136 by Bridgette Habermann, RN Outcome: Progressing   Problem: Elimination: Goal: Will not experience complications related to bowel motility 10/18/2021 1137 by Bridgette Habermann, RN Outcome: Progressing 10/18/2021 1136 by Bridgette Habermann, RN  Outcome: Progressing Goal: Will not experience complications related to urinary retention Outcome: Progressing   Problem: Pain Managment: Goal: General experience of comfort will improve 10/18/2021 1137 by Bridgette Habermann, RN Outcome: Progressing 10/18/2021 1136 by Bridgette Habermann, RN Outcome: Progressing   Problem: Safety: Goal: Ability to remain free from injury will improve 10/18/2021 1137 by Bridgette Habermann, RN Outcome: Progressing 10/18/2021 1136 by Bridgette Habermann, RN Outcome: Progressing   Problem: Skin Integrity: Goal: Risk for impaired skin integrity will decrease 10/18/2021 1137 by Bridgette Habermann, RN Outcome: Progressing 10/18/2021 1136 by Bridgette Habermann, RN Outcome: Progressing

## 2021-10-18 NOTE — Progress Notes (Signed)
      PATIENT: [name]  OHY:[id number]  DOB: [birth year]    Patient complained of abdominal tightness and pressure that went up into her chest.  Did have non sustained v  tach run  [facility] EKG Troponin Stat mag level  Kathlene Cote NP  [facility]

## 2021-10-18 NOTE — Plan of Care (Signed)
  Problem: Education: Goal: Ability to describe self-care measures that may prevent or decrease complications (Diabetes Survival Skills Education) will improve Outcome: Progressing   Problem: Coping: Goal: Ability to adjust to condition or change in health will improve Outcome: Progressing   Problem: Nutritional: Goal: Maintenance of adequate nutrition will improve Outcome: Progressing   Problem: Education: Goal: Knowledge of General Education information will improve Description: Including pain rating scale, medication(s)/side effects and non-pharmacologic comfort measures Outcome: Progressing   Problem: Health Behavior/Discharge Planning: Goal: Ability to manage health-related needs will improve Outcome: Progressing   Problem: Nutrition: Goal: Adequate nutrition will be maintained Outcome: Progressing

## 2021-10-18 NOTE — Progress Notes (Addendum)
[facility]  PROGRESS NOTE  [name] AST:[id number] DOB: 09/14/1965 DOA: 10/15/2021 PCP: Bo Merino, [name]   Brief HPI:   56 y.o. female, with medical history of hypertension, prior stroke with left-sided deficits, seizures on medication, COPD, diabetes mellitus type 2, aortic root dilatation, diastolic dysfunction, left eye blindness came to ED after fall.  Patient was admitted with acute kidney injury, dehydration, syncope, possible seizure.    Subjective   Patient seen and examined, last night complaint of abdominal tightness and chest pressure.  EKG was unremarkable.  Troponin negative.  Today patient complains of abdominal distention and tightness.  PT saw patient and recommended skilled nursing facility for rehab.   Assessment/Plan:     Acute kidney injury -Secondary to poor p.o. intake, diarrhea; creatinine peaked at 2.93 -Patient is also on Lasix 20 mg daily along with HCTZ 50 mg daily; both medications currently on hold -Renal function is improving, today creatinine is down to 1.20 -Continue LR at 75 ML per hour -CT renal stone study was negative for hydronephrosis, hydroureter; only showed small nonobstructing renal stones bilaterally   Syncope /fall -Patient fell likely from syncope in the setting of dehydration as above -Orthostatic vital signs were negative -Continue monitoring on telemetry -PT evaluation obtained; plan for skilled nursing facility for rehab  History of seizure -Patient is on Keppra at home -There was concern for seizure, EEG obtained-result is negative for epileptiform discharges -Continue Keppra  History of stroke -Continue Plavix  Abdominal distention/GERD -Start Protonix 40 mg p.o. daily -We will also start simethicone for gas/abdominal distention -Ambulate patient -Check KUB  Diabetes mellitus type 2 -CBG is elevated; patient says her usual blood sugar runs around 200-300 -She becomes symptomatic with  hypoglycemia if blood sugar less than 200 -Started on resistant sliding scale with NovoLog -CBG is now around 300 -Improved after changing Semglee to 20 units subcu twice daily   History of hypertension -Coreg, Benicar HCT, Aldactone currently on hold due to hypotension  Hyperlipidemia -Restart Crestor, Zetia    Medications     Chlorhexidine Gluconate Cloth  6 each Topical Daily   ezetimibe  10 mg Oral Daily   heparin  5,000 Units Subcutaneous Q8H   insulin aspart  0-20 Units Subcutaneous TID WC   insulin aspart  0-5 Units Subcutaneous QHS   insulin glargine-yfgn  20 Units Subcutaneous BID   levETIRAcetam  500 mg Oral QPC breakfast   levETIRAcetam  750 mg Oral QPM   rosuvastatin  10 mg Oral Daily   sodium chloride flush  3 mL Intravenous Q12H     Data Reviewed:   CBG:  Recent Labs  Lab 10/17/21 1111 10/17/21 1709 10/17/21 2024 10/17/21 2200 10/18/21 0744  GLUCAP 348* 299* 314* 332* 327*    SpO2: 94 %    Vitals:   10/17/21 2346 10/17/21 2357 10/18/21 0450 10/18/21 0742  BP:  135/82 (!) 144/91 (!) 152/89  Pulse: 86 86 90 92  Resp: (!) 26 19 17 20   Temp:  98 F (36.7 C) 98.2 F (36.8 C) 98 F (36.7 C)  TempSrc:    Oral  SpO2:  100% 93% 94%      Data Reviewed:  Basic Metabolic Panel: Recent Labs  Lab 10/15/21 1213 10/16/21 0828 10/17/21 0302 10/18/21 0329  NA 132* 134* 132* 136  K 3.9 3.7 4.0 3.5  CL 90* 93* 97* 94*  CO2 25 28 19* 28  GLUCOSE 407* 425* 344* 300*  BUN 36* 31* 29* 18  CREATININE 2.96* 1.93* 1.46* 1.20*  CALCIUM 10.0 9.4 8.8* 9.8  MG  --   --   --  2.1    CBC: Recent Labs  Lab 10/15/21 1213 10/16/21 0828  WBC 11.1* 7.7  HGB 15.3* 13.7  HCT 46.7* 42.8  MCV 87.3 89.4  PLT 303 250    LFT Recent Labs  Lab 10/15/21 1213 10/16/21 0828  AST 21 17  ALT 15 16  ALKPHOS 82 77  BILITOT 0.8 0.7  PROT 8.2* 7.2  ALBUMIN 4.0 3.5     Antibiotics: Anti-infectives (From admission, onward)    None        DVT  prophylaxis: Heparin  Code Status: Full code  Family Communication: No family at bedside   CONSULTS    Objective    Physical Examination:  General-appears in no acute distress Heart-S1-S2, regular, no murmur auscultated Lungs-clear to auscultation bilaterally, no wheezing or crackles auscultated Abdomen-soft, mild tenderness to palpation, generalized, distended, no organomegaly Extremities-no edema in the lower extremities Neuro-alert, oriented x3, no focal deficit noted  Status is: Inpatient:           Oswald Hillock   [facility] If 7PM-7AM, please contact night-coverage at [email or web address], Office  [phone]   10/18/2021, 9:31 AM  LOS: 3 days

## 2021-10-19 DIAGNOSIS — W19XXXA Unspecified fall, initial encounter: Secondary | ICD-10-CM | POA: Diagnosis not present

## 2021-10-19 DIAGNOSIS — N179 Acute kidney failure, unspecified: Secondary | ICD-10-CM | POA: Diagnosis not present

## 2021-10-19 DIAGNOSIS — S0081XA Abrasion of other part of head, initial encounter: Secondary | ICD-10-CM | POA: Diagnosis not present

## 2021-10-19 DIAGNOSIS — R739 Hyperglycemia, unspecified: Secondary | ICD-10-CM | POA: Diagnosis not present

## 2021-10-19 LAB — BASIC METABOLIC PANEL
Anion gap: 8 (ref 5–15)
BUN: 11 mg/dL (ref 6–20)
CO2: 25 mmol/L (ref 22–32)
Calcium: 8.9 mg/dL (ref 8.9–10.3)
Chloride: 103 mmol/L (ref 98–111)
Creatinine, Ser: 1.19 mg/dL — ABNORMAL HIGH (ref 0.44–1.00)
GFR, Estimated: 53 mL/min — ABNORMAL LOW (ref >60)
Glucose, Bld: 215 mg/dL — ABNORMAL HIGH (ref 70–99)
Potassium: 3.8 mmol/L (ref 3.5–5.1)
Sodium: 136 mmol/L (ref 135–145)

## 2021-10-19 LAB — GLUCOSE, CAPILLARY
Glucose-Capillary: 225 mg/dL — ABNORMAL HIGH (ref 70–99)
Glucose-Capillary: 246 mg/dL — ABNORMAL HIGH (ref 70–99)
Glucose-Capillary: 248 mg/dL — ABNORMAL HIGH (ref 70–99)
Glucose-Capillary: 201 mg/dL — ABNORMAL HIGH (ref 70–99)

## 2021-10-19 MED ORDER — PANTOPRAZOLE SODIUM 40 MG PO TBEC
40.0000 mg | DELAYED_RELEASE_TABLET | Freq: Once | ORAL | Status: AC
  Administered 2021-10-19: 40 mg via ORAL
  Filled 2021-10-19: qty 1

## 2021-10-19 MED ORDER — FUROSEMIDE 20 MG PO TABS
20.0000 mg | ORAL_TABLET | Freq: Every day | ORAL | Status: DC
  Administered 2021-10-19 – 2021-10-21 (×3): 20 mg via ORAL
  Filled 2021-10-19 (×3): qty 1

## 2021-10-19 MED ORDER — PROCHLORPERAZINE MALEATE 10 MG PO TABS
10.0000 mg | ORAL_TABLET | Freq: Once | ORAL | Status: AC
  Administered 2021-10-19: 10 mg via ORAL
  Filled 2021-10-19: qty 1

## 2021-10-19 MED ORDER — AMITRIPTYLINE HCL 50 MG PO TABS
25.0000 mg | ORAL_TABLET | Freq: Every day | ORAL | Status: AC
  Filled 2021-10-19: qty 1

## 2021-10-19 MED ORDER — OXYCODONE-ACETAMINOPHEN 5-325 MG PO TABS
1.0000 | ORAL_TABLET | Freq: Once | ORAL | Status: AC
  Administered 2021-10-19: 1 via ORAL
  Filled 2021-10-19: qty 1

## 2021-10-19 MED ORDER — GABAPENTIN 600 MG PO TABS
600.0000 mg | ORAL_TABLET | Freq: Once | ORAL | Status: DC
  Filled 2021-10-19: qty 1

## 2021-10-19 MED ORDER — CARVEDILOL 25 MG PO TABS
25.0000 mg | ORAL_TABLET | Freq: Two times a day (BID) | ORAL | Status: DC
  Administered 2021-10-19 – 2021-10-21 (×5): 25 mg via ORAL
  Filled 2021-10-19 (×5): qty 1

## 2021-10-19 MED ORDER — OXYCODONE HCL 5 MG PO TABS
5.0000 mg | ORAL_TABLET | Freq: Four times a day (QID) | ORAL | Status: DC | PRN
  Administered 2021-10-19: 5 mg via ORAL
  Filled 2021-10-19: qty 1

## 2021-10-19 NOTE — Plan of Care (Signed)
  Problem: Education: Goal: Ability to describe self-care measures that may prevent or decrease complications (Diabetes Survival Skills Education) will improve Outcome: Progressing   Problem: Coping: Goal: Ability to adjust to condition or change in health will improve Outcome: Progressing   Problem: Health Behavior/Discharge Planning: Goal: Ability to manage health-related needs will improve Outcome: Progressing   Problem: Metabolic: Goal: Ability to maintain appropriate glucose levels will improve Outcome: Progressing   Problem: Nutritional: Goal: Maintenance of adequate nutrition will improve Outcome: Progressing Goal: Progress toward achieving an optimal weight will improve Outcome: Progressing   Problem: Tissue Perfusion: Goal: Adequacy of tissue perfusion will improve Outcome: Progressing   Problem: Education: Goal: Knowledge of General Education information will improve Description: Including pain rating scale, medication(s)/side effects and non-pharmacologic comfort measures Outcome: Progressing

## 2021-10-19 NOTE — NC FL2 (Signed)
[facility] LEVEL OF CARE SCREENING TOOL     IDENTIFICATION  Patient Name: [name] Birthdate: 1965/07/03 Sex: female Admission Date (Current Location): 10/15/2021  [facility] and [place] Number:  Herbalist and Address:  [facility], [place] [address], [place], [place] [address]      Provider Number: [id number]  Attending Physician Name and Address:  Oswald Hillock, MD  Relative Name and Phone Number:  [name] [phone]    Current Level of Care: Hospital Recommended Level of Care: [place] Prior Approval Number:    Date Approved/Denied:   PASRR Number: [id number]  Discharge Plan: SNF    Current Diagnoses: Patient Active Problem List   Diagnosis Date Noted   Acute kidney injury ([place]) 10/15/2021   Mild episode of recurrent major depressive disorder ([place]) 08/14/2021   Hypoglycemia 07/07/2021   UTI (urinary tract infection) 07/07/2021   Syncope 07/07/2021   Diarrhea 06/09/2021   Hyperkalemia 06/09/2021   Intractable nausea and vomiting 06/08/2021   Small bowel obstruction ([place]) 05/18/2021   Orthostatic hypotension 04/29/2021   Constipation 04/28/2021   SBO (small bowel obstruction) ([place]) 04/23/2021   Hypertensive urgency 04/23/2021   GERD without esophagitis 04/23/2021   AKI (acute kidney injury) ([place]) 04/23/2021   Dyslipidemia 04/23/2021   Chronic diastolic CHF (congestive heart failure) ([place]) 04/23/2021   Cervical spondylosis 02/12/2021   Lumbar facet arthropathy 02/12/2021   Angina pectoris ([place]) 11/13/2020   Major depressive disorder, recurrent episode, moderate ([place]) 11/13/2020   Adrenal adenoma, right    PAD (peripheral artery disease) ([place]) 01/24/2020   Abnormal ankle brachial index (ABI) 12/18/2019   CAD (coronary artery disease) 04/27/2019   Diabetic polyneuropathy associated with type 2 diabetes mellitus ([place]) 03/02/2018   Vitamin D deficiency 01/24/2018   Overactive bladder  05/18/2017   Dilated aortic root (HCC)    Sacroiliac dysfunction 10/09/2014   Chronic low back pain 09/03/2014   CVA, old, hemiparesis ([place]) 09/03/2014   OSA (obstructive sleep apnea) 05/15/2014   Dyslipidemia associated with type 2 diabetes mellitus ([place]) 01/21/2014   Essential hypertension, benign 01/21/2014   Chronic pain syndrome 01/21/2014   Headache disorder 09/07/2013   Mixed urge and stress incontinence 08/25/2011   Ureteral stricture, right 08/25/2011   Nephrolithiasis 08/22/2011   Incisional hernia 05/10/2011   Obesity, Class III, BMI 40-49.9 (morbid obesity) ([place]) 05/10/2011   BLINDNESS, LEGAL, [place] DEFINITION 09/14/2006   Seizure ([place]) 09/13/2006    Orientation RESPIRATION BLADDER Height & Weight     Self, Time, Situation, Place  Normal Continent, Indwelling catheter Weight:   Height:     BEHAVIORAL SYMPTOMS/MOOD NEUROLOGICAL BOWEL NUTRITION STATUS      Continent Diet (See DC summary)  AMBULATORY STATUS COMMUNICATION OF NEEDS Skin   Limited Assist Verbally Skin abrasions (Arm and Face abrasions)                       Personal Care Assistance Level of Assistance  Bathing, Feeding, Dressing Bathing Assistance: Limited assistance Feeding assistance: Independent Dressing Assistance: Limited assistance     Functional Limitations Info  Sight, Hearing, Speech Sight Info: Impaired Hearing Info: Adequate Speech Info: Adequate    SPECIAL CARE FACTORS FREQUENCY  PT (By licensed PT), OT (By licensed OT)     PT Frequency: 5x week OT Frequency: 5x week            Contractures Contractures Info: Not present    Additional Factors Info  Code  Status, Allergies, Insulin Sliding Scale Code Status Info: Full Allergies Info: Codeine, Lactose Intolerance   Insulin Sliding Scale Info: See DC summary       Current Medications (10/19/2021):  This is the current hospital active medication list Current Facility-Administered Medications  Medication Dose Route  Frequency Provider Last Rate Last Admin   0.9 %  sodium chloride infusion  250 mL Intravenous PRN Oswald Hillock, MD       acetaminophen (TYLENOL) tablet 650 mg  650 mg Oral Q6H PRN Oswald Hillock, MD   650 mg at 10/18/21 2117   Or   acetaminophen (TYLENOL) suppository 650 mg  650 mg Rectal Q6H PRN Oswald Hillock, MD       carvedilol (COREG) tablet 25 mg  25 mg Oral BID Oswald Hillock, MD   25 mg at 10/19/21 1019   Chlorhexidine Gluconate Cloth 2 % PADS 6 each  6 each Topical Daily Oswald Hillock, MD   6 each at 10/19/21 1023   ezetimibe (ZETIA) tablet 10 mg  10 mg Oral Daily Oswald Hillock, MD   10 mg at 10/19/21 1020   furosemide (LASIX) tablet 20 mg  20 mg Oral Daily Oswald Hillock, MD   20 mg at 10/19/21 1019   heparin injection 5,000 Units  5,000 Units Subcutaneous Q8H Oswald Hillock, MD   5,000 Units at 10/19/21 0558   hydrALAZINE (APRESOLINE) injection 10 mg  10 mg Intravenous Q6H PRN Adefeso, Oladapo, DO   10 mg at 10/19/21 0001   insulin aspart (novoLOG) injection 0-20 Units  0-20 Units Subcutaneous TID WC Oswald Hillock, MD   7 Units at 10/19/21 1158   insulin aspart (novoLOG) injection 0-5 Units  0-5 Units Subcutaneous QHS Oswald Hillock, MD   2 Units at 10/18/21 2129   insulin glargine-yfgn (SEMGLEE) injection 20 Units  20 Units Subcutaneous BID Oswald Hillock, MD   20 Units at 10/19/21 1020   levETIRAcetam (KEPPRA) tablet 500 mg  500 mg Oral QPC breakfast Oswald Hillock, MD   500 mg at 10/19/21 1019   levETIRAcetam (KEPPRA) tablet 750 mg  750 mg Oral QPM Oswald Hillock, MD   750 mg at 10/18/21 1831   ondansetron (ZOFRAN) tablet 4 mg  4 mg Oral Q6H PRN Oswald Hillock, MD   4 mg at 10/18/21 2118   Or   ondansetron (ZOFRAN) injection 4 mg  4 mg Intravenous Q6H PRN Oswald Hillock, MD       pantoprazole (PROTONIX) EC tablet 40 mg  40 mg Oral Q1200 Oswald Hillock, MD   40 mg at 10/19/21 1158   rosuvastatin (CRESTOR) tablet 10 mg  10 mg Oral Daily Oswald Hillock, MD   10 mg at 10/19/21 1018   simethicone  (MYLICON) chewable tablet 160 mg  160 mg Oral TID Oswald Hillock, MD   160 mg at 10/19/21 1019   simethicone (MYLICON) chewable tablet 80 mg  80 mg Oral QID PRN Sharion Settler, NP   80 mg at 10/18/21 0854   sodium chloride flush (NS) 0.9 % injection 3 mL  3 mL Intravenous Q12H Oswald Hillock, MD   3 mL at 10/18/21 2132   sodium chloride flush (NS) 0.9 % injection 3 mL  3 mL Intravenous PRN Oswald Hillock, MD         Discharge Medications: Please see discharge summary for a list of discharge medications.  Relevant Imaging Results:  Relevant Lab Results:   Additional Information SS# [place], [place]

## 2021-10-19 NOTE — TOC Initial Note (Signed)
Transition of Care [facility]) - Initial/Assessment Note    Patient Details  Name: [name] MRN: [id number] Date of Birth: 09-11-1965  [facility]) CM/SW Contact:    Coralee Pesa, [place] Phone Number: 10/19/2021, 12:43 PM  Clinical Narrative:                  CSW spoke with pt to discuss SNF recommendation. Pt states she is agreeable and that her daughter is her HCPOA. Pt asks CSW to call dtr for further information. CSW spoke with [place], who is also in agreement. They have no preference but dtr would prefer to be in [place], advised of [email or web address]. Pt lives with dtr, and can return at discharge. Dtr states pt may have Medicaid as well, advised her to let the facility of choice know, as Medicaid will pick up any copays. Dtr states she would like EMS transport at discharge, as she is 9 months pregnant and unable to assist. TOC will continue to follow for DC needs. Expected Discharge Plan: Skilled Nursing Facility Barriers to Discharge: Continued Medical Work up, Ship broker, SNF Pending bed offer   Patient Goals and CMS Choice Patient states their goals for this hospitalization and ongoing recovery are:: Pt would like to get stronger. CMS Medicare.gov Compare Post Acute Care list provided to:: Patient Choice offered to / list presented to : Patient, Adult Children  Expected Discharge Plan and Services Expected Discharge Plan: [place] Acute Care Choice: [place] Living arrangements for the past 2 months: Single Family Home                                      Prior Living Arrangements/Services Living arrangements for the past 2 months: Single Family Home Lives with:: Adult Children Patient language and need for interpreter reviewed:: Yes Do you feel safe going back to the place where you live?: Yes      Need for Family Participation in Patient Care: Yes (Comment) Care giver support system in  place?: Yes (comment)   Criminal Activity/Legal Involvement Pertinent to Current Situation/Hospitalization: No - Comment as needed  Activities of Daily Living Home Assistive Devices/Equipment: Walker (specify type), CPAP, Cane (specify quad or straight) ADL Screening (condition at time of admission) Patient's cognitive ability adequate to safely complete daily activities?: Yes Is the patient deaf or have difficulty hearing?: No Does the patient have difficulty seeing, even when wearing glasses/contacts?: Yes Does the patient have difficulty concentrating, remembering, or making decisions?: Yes Patient able to express need for assistance with ADLs?: Yes Does the patient have difficulty dressing or bathing?: Yes Independently performs ADLs?: No Communication: Independent Dressing (OT): Needs assistance Grooming: Needs assistance Does the patient have difficulty walking or climbing stairs?: Yes Weakness of Legs: Both Weakness of Arms/Hands: Both  Permission Sought/Granted Permission sought to share information with : Family Supports Permission granted to share information with : Yes, Verbal Permission Granted  Share Information with NAME: [name]     Permission granted to share info w Relationship: Daughter  Permission granted to share info w Contact Information: [phone]  Emotional Assessment Appearance:: Appears stated age Attitude/Demeanor/Rapport: Engaged Affect (typically observed): Appropriate Orientation: : Oriented to Self, Oriented to Place, Oriented to  Time, Oriented to Situation Alcohol / Substance Use: Not Applicable Psych Involvement: No (comment)  Admission diagnosis:  Hyperglycemia [R73.9] AKI (acute kidney injury) ([place]) [N17.9]  Acute kidney injury ([place]) [N17.9] Fall, initial encounter [W19.XXXA] Facial abrasion, initial encounter [S00.81XA] Patient Active Problem List   Diagnosis Date Noted   Acute kidney injury ([place]) 10/15/2021   Mild episode of  recurrent major depressive disorder ([place]) 08/14/2021   Hypoglycemia 07/07/2021   UTI (urinary tract infection) 07/07/2021   Syncope 07/07/2021   Diarrhea 06/09/2021   Hyperkalemia 06/09/2021   Intractable nausea and vomiting 06/08/2021   Small bowel obstruction (HCC) 05/18/2021   Orthostatic hypotension 04/29/2021   Constipation 04/28/2021   SBO (small bowel obstruction) ([place]) 04/23/2021   Hypertensive urgency 04/23/2021   GERD without esophagitis 04/23/2021   AKI (acute kidney injury) ([place]) 04/23/2021   Dyslipidemia 04/23/2021   Chronic diastolic CHF (congestive heart failure) ([place]) 04/23/2021   Cervical spondylosis 02/12/2021   Lumbar facet arthropathy 02/12/2021   Angina pectoris ([place]) 11/13/2020   Major depressive disorder, recurrent episode, moderate ([place]) 11/13/2020   Adrenal adenoma, right    PAD (peripheral artery disease) ([place]) 01/24/2020   Abnormal ankle brachial index (ABI) 12/18/2019   CAD (coronary artery disease) 04/27/2019   Diabetic polyneuropathy associated with type 2 diabetes mellitus ([place]) 03/02/2018   Vitamin D deficiency 01/24/2018   Overactive bladder 05/18/2017   Dilated aortic root (HCC)    Sacroiliac dysfunction 10/09/2014   Chronic low back pain 09/03/2014   CVA, old, hemiparesis ([place]) 09/03/2014   OSA (obstructive sleep apnea) 05/15/2014   Dyslipidemia associated with type 2 diabetes mellitus ([place]) 01/21/2014   Essential hypertension, benign 01/21/2014   Chronic pain syndrome 01/21/2014   Headache disorder 09/07/2013   Mixed urge and stress incontinence 08/25/2011   Ureteral stricture, right 08/25/2011   Nephrolithiasis 08/22/2011   Incisional hernia 05/10/2011   Obesity, Class III, BMI 40-49.9 (morbid obesity) ([place]) 05/10/2011   BLINDNESS, LEGAL, [place] DEFINITION 09/14/2006   Seizure ([place]) 09/13/2006   PCP:  Bo Merino, [name] Pharmacy:   [id number]:  [phone]: [phone]  [facility] [place]Phone: [phone]: [phone] [facility], [place](New Address) - [place] [address] [address] [place]Phone: [phone]: [phone]    Social Determinants of Health (SDOH) Interventions    Readmission Risk Interventions    06/11/2021   11:43 AM 05/19/2021   12:02 PM 04/24/2021    3:02 PM  Readmission Risk Prevention Plan  Transportation Screening Complete Complete Complete  PCP or Specialist Appt within 5-7 Days  Complete   Home Care Screening  Complete   Medication Review (RN CM)  Complete   HRI or HArco  Complete  Palliative Care Screening   Not Applicable  Medication Review (RN Care Manager) Complete  Complete  PCP or Specialist appointment within 3-5 days of discharge Complete    HRI or HAguilitaComplete    SW Recovery Care/Counseling Consult Complete    [place] Applicable

## 2021-10-19 NOTE — Progress Notes (Signed)
Patient c/o headache and has elevated BP. Hydralazine ordered and given for high BP. Blood sugar checked and was 195. Patient is currently alerted and oriented and her blood pressure has decreased. Per patient, her headache is better

## 2021-10-19 NOTE — Progress Notes (Signed)
Pt stated she doesn't want to wear CPAP for the night.  

## 2021-10-19 NOTE — Progress Notes (Signed)
[facility]  PROGRESS NOTE  [name] KWI:[id number] DOB: [birth year] DOA: 10/15/2021 PCP: Bo Merino, [name]   Brief HPI:   56 y.o. female, with medical history of hypertension, prior stroke with left-sided deficits, seizures on medication, COPD, diabetes mellitus type 2, aortic root dilatation, diastolic dysfunction, left eye blindness came to ED after fall.  Patient was admitted with acute kidney injury, dehydration, syncope, possible seizure.    Subjective   Patient seen and examined, bloating is improved after starting simethicone.  Blood pressure was evaluated last night.   Assessment/Plan:     Acute kidney injury -Secondary to poor p.o. intake, diarrhea; creatinine peaked at 2.93 -Patient is also on Lasix 20 mg daily along with HCTZ 50 mg daily; both medications currently on hold -Renal function is improving, today creatinine is down to 1.19, likely at baseline -Patient is eating and drinking okay, will hold further IV fluids -CT renal stone study was negative for hydronephrosis, hydroureter; only showed small nonobstructing renal stones bilaterally   Syncope /fall -Patient fell likely from syncope in the setting of dehydration as above -Orthostatic vital signs were negative -Continue monitoring on telemetry -PT evaluation obtained; plan for skilled nursing facility for rehab  History of seizure -Patient is on Keppra at home -There was concern for seizure, EEG obtained-result is negative for epileptiform discharges -Continue Keppra  History of stroke -Continue Plavix  Abdominal distention/GERD -Start Protonix 40 mg p.o. daily -We will also start simethicone for gas/abdominal distention -Ambulate patient -Check KUB  Diabetes mellitus type 2 -CBG is elevated; patient says her usual blood sugar runs around 200-300 -She becomes symptomatic with hypoglycemia if blood sugar less than 200 -Started on resistant sliding scale with NovoLog -CBG is now  between 200 and 300s; which is patient's baseline -Continue Semglee  20 units subcu twice daily   History of hypertension -Coreg, Benicar HCT, Aldactone were held due to hypotension -Blood pressure is now elevated -We will slowly restart home medications, will start with Coreg 25 mg p.o. twice daily -Continue hydralazine 10 mg IV 6 hours as needed for BP greater than 170 -We will start low-dose Lasix 20 mg daily which patient was taking at home  Hyperlipidemia -Restart Crestor, Zetia    Medications     Chlorhexidine Gluconate Cloth  6 each Topical Daily   ezetimibe  10 mg Oral Daily   heparin  5,000 Units Subcutaneous Q8H   insulin aspart  0-20 Units Subcutaneous TID WC   insulin aspart  0-5 Units Subcutaneous QHS   insulin glargine-yfgn  20 Units Subcutaneous BID   levETIRAcetam  500 mg Oral QPC breakfast   levETIRAcetam  750 mg Oral QPM   pantoprazole  40 mg Oral Q1200   rosuvastatin  10 mg Oral Daily   simethicone  160 mg Oral TID   sodium chloride flush  3 mL Intravenous Q12H     Data Reviewed:   CBG:  Recent Labs  Lab 10/18/21 1611 10/18/21 1940 10/18/21 2127 10/18/21 2357 10/19/21 0804  GLUCAP 276* 231* 209* 195* 225*    SpO2: 94 %    Vitals:   10/19/21 0016 10/19/21 0027 10/19/21 0350 10/19/21 0802  BP: (!) 172/99 (!) 157/98  (!) 161/81  Pulse:    93  Resp:   (!) 22 18  Temp:    97.7 F (36.5 C)  TempSrc:    Oral  SpO2:   96% 94%      Data Reviewed:  Basic Metabolic Panel: Recent Labs  Lab 10/15/21 1213 10/16/21 0828 10/17/21 0302 10/18/21 0329 10/19/21 0427  NA 132* 134* 132* 136 136  K 3.9 3.7 4.0 3.5 3.8  CL 90* 93* 97* 94* 103  CO2 25 28 19* 28 25  GLUCOSE 407* 425* 344* 300* 215*  BUN 36* 31* 29* 18 11  CREATININE 2.96* 1.93* 1.46* 1.20* 1.19*  CALCIUM 10.0 9.4 8.8* 9.8 8.9  MG  --   --   --  2.1  --     CBC: Recent Labs  Lab 10/15/21 1213 10/16/21 0828  WBC 11.1* 7.7  HGB 15.3* 13.7  HCT 46.7* 42.8  MCV 87.3 89.4   PLT 303 250    LFT Recent Labs  Lab 10/15/21 1213 10/16/21 0828  AST 21 17  ALT 15 16  ALKPHOS 82 77  BILITOT 0.8 0.7  PROT 8.2* 7.2  ALBUMIN 4.0 3.5     Antibiotics: Anti-infectives (From admission, onward)    None        DVT prophylaxis: Heparin  Code Status: Full code  Family Communication: No family at bedside   CONSULTS    Objective    Physical Examination:  General-appears in no acute distress Heart-S1-S2, regular, no murmur auscultated Lungs-clear to auscultation bilaterally, no wheezing or crackles auscultated Abdomen-soft, nontender, no organomegaly Extremities-no edema in the lower extremities Neuro-alert, oriented x3, no focal deficit noted  Status is: Inpatient:           Oswald Hillock   [facility] If 7PM-7AM, please contact night-coverage at [email or web address], Office  [phone]   10/19/2021, 9:13 AM  LOS: 4 days

## 2021-10-20 DIAGNOSIS — S0081XA Abrasion of other part of head, initial encounter: Secondary | ICD-10-CM | POA: Diagnosis not present

## 2021-10-20 DIAGNOSIS — N179 Acute kidney failure, unspecified: Secondary | ICD-10-CM | POA: Diagnosis not present

## 2021-10-20 DIAGNOSIS — W19XXXA Unspecified fall, initial encounter: Secondary | ICD-10-CM | POA: Diagnosis not present

## 2021-10-20 DIAGNOSIS — R739 Hyperglycemia, unspecified: Secondary | ICD-10-CM | POA: Diagnosis not present

## 2021-10-20 LAB — BASIC METABOLIC PANEL
Anion gap: 6 (ref 5–15)
BUN: 9 mg/dL (ref 6–20)
CO2: 25 mmol/L (ref 22–32)
Calcium: 8.8 mg/dL — ABNORMAL LOW (ref 8.9–10.3)
Chloride: 105 mmol/L (ref 98–111)
Creatinine, Ser: 1.21 mg/dL — ABNORMAL HIGH (ref 0.44–1.00)
GFR, Estimated: 52 mL/min — ABNORMAL LOW (ref >60)
Glucose, Bld: 249 mg/dL — ABNORMAL HIGH (ref 70–99)
Potassium: 3.7 mmol/L (ref 3.5–5.1)
Sodium: 136 mmol/L (ref 135–145)

## 2021-10-20 LAB — GLUCOSE, CAPILLARY
Glucose-Capillary: 248 mg/dL — ABNORMAL HIGH (ref 70–99)
Glucose-Capillary: 244 mg/dL — ABNORMAL HIGH (ref 70–99)
Glucose-Capillary: 214 mg/dL — ABNORMAL HIGH (ref 70–99)
Glucose-Capillary: 204 mg/dL — ABNORMAL HIGH (ref 70–99)

## 2021-10-20 NOTE — Progress Notes (Signed)
Physical Therapy Treatment Patient Details Name: [name] MRN: [id number] DOB: 1965-10-20 Today's Date: 10/20/2021   History of Present Illness Pt is a 56 y/o female admitted secondary to fall. Found to have AKI. PMH includes DM, CVA with L deficits, L eye blindness, seizures, and COPD.    PT Comments    Patient with increased activity tolerance this session. No dizziness reported with activity. Patient ambulated in room with rolling walker with occasional safety cues to avoid running into objects on the left. Min A provided for standing for steadying assistance. The patient is concerned that she cannot manage at home at this time. Recommend SNF at discharge for ongoing PT to maximize independence and decrease caregiver burden.    Recommendations for follow up therapy are one component of a multi-disciplinary discharge planning process, led by the attending physician.  Recommendations may be updated based on patient status, additional functional criteria and insurance authorization.  Follow Up Recommendations  Skilled nursing-short term rehab (<3 hours/day) Can patient physically be transported by private vehicle: Yes   Assistance Recommended at Discharge Intermittent Supervision/Assistance  Patient can return home with the following A little help with walking and/or transfers;A little help with bathing/dressing/bathroom;Assistance with cooking/housework;Help with stairs or ramp for entrance;Assist for transportation   Equipment Recommendations  None recommended by PT    Recommendations for Other Services       Precautions / Restrictions Precautions Precautions: Fall Restrictions Weight Bearing Restrictions: No     Mobility  Bed Mobility               General bed mobility comments: not assessed as patient sitting up on arrival and post session    Transfers Overall transfer level: Needs assistance Equipment used: Rolling walker (2 wheels) Transfers: Sit  to/from Stand, Bed to chair/wheelchair/BSC Sit to Stand: Min assist           General transfer comment: steadying assistance provided. verbal cues for hand placement for safety    Ambulation/Gait Ambulation/Gait assistance: Min guard Gait Distance (Feet): 60 Feet Assistive device: Rolling walker (2 wheels) Gait Pattern/deviations: Step-to pattern, Step-through pattern, Decreased stride length Gait velocity: decreased     General Gait Details: step to initially progressing to step through gait with increased gait distance. occasional cues for safety to avoid running into objects on the left sude. Min guard provided with walking for safety. heart rate remained in the 80's with walking   Stairs             Wheelchair Mobility    Modified Rankin (Stroke Patients Only)       Balance Overall balance assessment: Needs assistance Sitting-balance support: No upper extremity supported, Feet supported Sitting balance-Leahy Scale: Fair     Standing balance support: Bilateral upper extremity supported Standing balance-Leahy Scale: Fair Standing balance comment: no external support required                            Cognition Arousal/Alertness: Awake/alert Behavior During Therapy: WFL for tasks assessed/performed Overall Cognitive Status: No family/caregiver present to determine baseline cognitive functioning                                 General Comments: patient able to follow single step commands consistently. cooperative throughout session        Exercises General Exercises - Lower Extremity Ankle Circles/Pumps: AAROM, Strengthening, Both, 5 reps,  Seated Long Arc Quad: AAROM, Strengthening, Both, 10 reps, Seated Other Exercises Other Exercises: cues for exercise technique for strengthening. encouraged seated LE exercises    General Comments        Pertinent Vitals/Pain Pain Assessment Pain Assessment: No/denies pain    Home  Living                          Prior Function            PT Goals (current goals can now be found in the care plan section) Acute Rehab PT Goals Patient Stated Goal: to get to rehab PT Goal Formulation: With patient Time For Goal Achievement: 10/31/21 Potential to Achieve Goals: Good Progress towards PT goals: Progressing toward goals    Frequency    Min 2X/week      PT Plan Current plan remains appropriate    Co-evaluation              AM-PAC PT "6 Clicks" Mobility   Outcome Measure  Help needed turning from your back to your side while in a flat bed without using bedrails?: A Little Help needed moving from lying on your back to sitting on the side of a flat bed without using bedrails?: A Little Help needed moving to and from a bed to a chair (including a wheelchair)?: A Little Help needed standing up from a chair using your arms (e.g., wheelchair or bedside chair)?: A Little Help needed to walk in hospital room?: A Little Help needed climbing 3-5 steps with a railing? : Total 6 Click Score: 16    End of Session Equipment Utilized During Treatment: Gait belt Activity Tolerance: Patient tolerated treatment well Patient left: in chair;with call bell/phone within reach   PT Visit Diagnosis: Unsteadiness on feet (R26.81);Muscle weakness (generalized) (M62.81)     Time: 3300-7622 PT Time Calculation (min) (ACUTE ONLY): 14 min  Charges:  $Therapeutic Activity: 8-22 mins                     Minna Merritts, PT, MPT    Percell Locus 10/20/2021, 1:51 PM

## 2021-10-20 NOTE — TOC Progression Note (Addendum)
Transition of Care [facility]) - Progression Note    Patient Details  Name: [name] MRN: [id number] Date of Birth: 1965/05/02  Transition of Care [facility]) CM/SW Contact  Joanne Chars, LCSW Phone Number: 10/20/2021, 1:34 PM  Clinical Narrative:   Bed offers provided to pt along with choice document.  She asked for response from [facility]--reached out to [place], [place] accept pt insurance.  Also provided list of ALF facilities which pt had requested.  Pt will review options.  Also reports family members are looking for a place in [facility] will get the name and let CSW know so referral can be sent.   1445: pt accepts offer at [facility].  [facility] notified and can accept pt tomorrow.   Auth request submitted in [place].  Expected Discharge Plan: Skilled Nursing Facility Barriers to Discharge: Continued Medical Work up, Ship broker, SNF Pending bed offer  Expected Discharge Plan and Services Expected Discharge Plan: [place] Choice: [place] arrangements for the past 2 months: Single Family Home                                       Social Determinants of Health (SDOH) Interventions    Readmission Risk Interventions    06/11/2021   11:43 AM 05/19/2021   12:02 PM 04/24/2021    3:02 PM  Readmission Risk Prevention Plan  Transportation Screening Complete Complete Complete  PCP or Specialist Appt within 5-7 Days  Complete   Home Care Screening  Complete   Medication Review (RN CM)  Complete   HRI or Home Care Consult   Complete  Palliative Care Screening   Not Applicable  Medication Review (RN Care Manager) Complete  Complete  PCP or Specialist appointment within 3-5 days of discharge Complete    HRI or [place] Complete    SW Recovery Care/Counseling Consult Complete    Seth Ward Not  Applicable

## 2021-10-20 NOTE — Inpatient Diabetes Management (Signed)
Inpatient Diabetes Program Recommendations  AACE/ADA: New Consensus Statement on Inpatient Glycemic Control (2015)  Target Ranges:  Prepandial:   less than 140 mg/dL      [facility] postprandial:   less than 180 mg/dL (1-2 hours)      Critically ill patients:  140 - 180 mg/dL   Lab Results  Component Value Date   GLUCAP 248 (H) 10/20/2021   HGBA1C 13.7 (H) 10/15/2021    Review of Glycemic Control  Latest Reference Range & Units 10/18/21 07:44 10/18/21 11:47 10/18/21 16:11 10/18/21 19:40 10/18/21 21:27 10/18/21 23:57 10/19/21 08:04 10/19/21 11:16 10/19/21 16:29 10/19/21 21:10 10/20/21 07:53  Glucose-Capillary 70 - 99 mg/dL 327 (H) 244 (H) 276 (H) 231 (H) 209 (H) 195 (H) 225 (H) 246 (H) 248 (H) 201 (H) 248 (H)   Diabetes history: DM 2 Outpatient Diabetes medications: per pt report only taking Humalog SSI and Trulicity 4.5 mg every week, Farxiga 10 mg Daily Current orders for Inpatient glycemic control:  Novolog 0-20 units tid + hs Semglee 20 units bid  Pt feels hypoglycemic under 200 mg/dl Glucose trends in 300-400 range.   Inpatient Diabetes Program Recommendations:    -  Increase Semglee to 25 units bid   Thanks,  Tama Headings RN, MSN, BC-ADM Inpatient Diabetes Coordinator Team Pager [phone] (8a-5p)

## 2021-10-20 NOTE — Progress Notes (Addendum)
[facility]  PROGRESS NOTE  [name] UMP:[id number] DOB: 1965-07-02 DOA: 10/15/2021 PCP: Bo Merino, [name]   Brief HPI:   56 y.o. female, with medical history of hypertension, prior stroke with left-sided deficits, seizures on medication, COPD, diabetes mellitus type 2, aortic root dilatation, diastolic dysfunction, left eye blindness came to ED after fall.  Patient was admitted with acute kidney injury, dehydration, syncope, possible seizure.    Subjective   Patient seen and examined, had BM this morning.  Abdominal bloating has significantly improved.   Assessment/Plan:     Acute kidney injury -Secondary to poor p.o. intake, diarrhea; creatinine peaked at 2.93 -Patient is also on Lasix 20 mg daily along with HCTZ 50 mg daily; both medications currently on hold -Renal function is improving, today creatinine is down to 1.21, likely at baseline -Patient is eating and drinking okay, IV fluids are on hold -CT renal stone study was negative for hydronephrosis, hydroureter; only showed small nonobstructing renal stones bilaterally   Syncope /fall -Patient fell likely from syncope in the setting of dehydration as above -Orthostatic vital signs were negative -Continue monitoring on telemetry -PT evaluation obtained; plan for skilled nursing facility for rehab  History of seizure -Patient is on Keppra at home -There was concern for seizure, EEG obtained-result is negative for epileptiform discharges -Continue Keppra  History of stroke -Continue Plavix  Abdominal distention/GERD -Improved with Protonix, simethicone -Continue Protonix 40 mg p.o. daily  Diabetes mellitus type 2 -CBG is elevated; patient says her usual blood sugar runs around 200-300 -She becomes symptomatic with hypoglycemia if blood sugar less than 200 -Started on resistant sliding scale with NovoLog -CBG is now between 200 and 300s; which is patient's baseline -Continue Semglee  20 units  subcu twice daily   History of hypertension -Coreg, Benicar HCT, Aldactone were held due to hypotension -Blood pressure is now elevated -We will slowly restart home medications, will start with Coreg 25 mg p.o. twice daily -Continue hydralazine 10 mg IV 6 hours as needed for BP greater than 170 -We will start low-dose Lasix 20 mg daily which patient was taking at home  Hyperlipidemia -Restart Crestor, Zetia    Medications     amitriptyline  25 mg Oral QHS   carvedilol  25 mg Oral BID   Chlorhexidine Gluconate Cloth  6 each Topical Daily   ezetimibe  10 mg Oral Daily   furosemide  20 mg Oral Daily   gabapentin  600 mg Oral Once   heparin  5,000 Units Subcutaneous Q8H   insulin aspart  0-20 Units Subcutaneous TID WC   insulin aspart  0-5 Units Subcutaneous QHS   insulin glargine-yfgn  20 Units Subcutaneous BID   levETIRAcetam  500 mg Oral QPC breakfast   levETIRAcetam  750 mg Oral QPM   pantoprazole  40 mg Oral Q1200   rosuvastatin  10 mg Oral Daily   simethicone  160 mg Oral TID   sodium chloride flush  3 mL Intravenous Q12H     Data Reviewed:   CBG:  Recent Labs  Lab 10/19/21 1116 10/19/21 1629 10/19/21 2110 10/20/21 0753 10/20/21 1158  GLUCAP 246* 248* 201* 248* 244*    SpO2: 100 %    Vitals:   10/19/21 0802 10/19/21 1920 10/20/21 0343 10/20/21 1435  BP: (!) 161/81 (!) 132/90 (!) 141/88 (!) 145/81  Pulse: 93   82  Resp: 18 16 20 17   Temp: 97.7 F (36.5 C) 98.5 F (36.9 C) 98.1 F (36.7 C)  98.4 F (36.9 C)  TempSrc: Oral Oral    SpO2: 94% 95% 95% 100%      Data Reviewed:  Basic Metabolic Panel: Recent Labs  Lab 10/16/21 0828 10/17/21 0302 10/18/21 0329 10/19/21 0427 10/20/21 1102  NA 134* 132* 136 136 136  K 3.7 4.0 3.5 3.8 3.7  CL 93* 97* 94* 103 105  CO2 28 19* 28 25 25   GLUCOSE 425* 344* 300* 215* 249*  BUN 31* 29* 18 11 9   CREATININE 1.93* 1.46* 1.20* 1.19* 1.21*  CALCIUM 9.4 8.8* 9.8 8.9 8.8*  MG  --   --  2.1  --   --      CBC: Recent Labs  Lab 10/15/21 1213 10/16/21 0828  WBC 11.1* 7.7  HGB 15.3* 13.7  HCT 46.7* 42.8  MCV 87.3 89.4  PLT 303 250    LFT Recent Labs  Lab 10/15/21 1213 10/16/21 0828  AST 21 17  ALT 15 16  ALKPHOS 82 77  BILITOT 0.8 0.7  PROT 8.2* 7.2  ALBUMIN 4.0 3.5     Antibiotics: Anti-infectives (From admission, onward)    None        DVT prophylaxis: Heparin  Code Status: Full code  Family Communication: No family at bedside   CONSULTS    Objective    Physical Examination:  General-appears in no acute distress Heart-S1-S2, regular, no murmur auscultated Lungs-clear to auscultation bilaterally, no wheezing or crackles auscultated Abdomen-soft, nontender, no organomegaly Extremities-no edema in the lower extremities Neuro-alert, oriented x3, no focal deficit noted  Status is: Inpatient:           Oswald Hillock   [facility] If 7PM-7AM, please contact night-coverage at [email or web address], Office  [phone]   10/20/2021, 2:53 PM  LOS: 5 days

## 2021-10-20 NOTE — Plan of Care (Signed)

## 2021-10-20 NOTE — Care Management Important Message (Signed)
Important Message  Patient Details  Name: [name] MRN: [id number] Date of Birth: 21-Feb-1965   Medicare Important Message Given:  Yes     Hannah Beat 10/20/2021, 12:38 PM

## 2021-10-21 ENCOUNTER — Other Ambulatory Visit: Payer: Self-pay

## 2021-10-21 LAB — GLUCOSE, CAPILLARY
Glucose-Capillary: 253 mg/dL — ABNORMAL HIGH (ref 70–99)
Glucose-Capillary: 263 mg/dL — ABNORMAL HIGH (ref 70–99)

## 2021-10-21 MED ORDER — INSULIN ASPART 100 UNIT/ML IJ SOLN: 0.0000 [IU] | Freq: Three times a day (TID) | INTRAMUSCULAR | 11 refills | Status: DC

## 2021-10-21 MED ORDER — SENNOSIDES-DOCUSATE SODIUM 8.6-50 MG PO TABS: 1.0000 | ORAL_TABLET | Freq: Every evening | ORAL | 0 refills | Status: DC | PRN

## 2021-10-21 MED ORDER — LOPERAMIDE HCL 2 MG PO CAPS
2.0000 mg | ORAL_CAPSULE | ORAL | Status: DC | PRN
  Administered 2021-10-21: 2 mg via ORAL
  Filled 2021-10-21: qty 1

## 2021-10-21 MED ORDER — POLYETHYLENE GLYCOL 3350 17 G PO PACK: 17.0000 g | PACK | Freq: Every day | ORAL | 3 refills | Status: DC | PRN

## 2021-10-21 MED ORDER — OXYCODONE HCL 5 MG PO TABS: 5.0000 mg | ORAL_TABLET | Freq: Four times a day (QID) | ORAL | 0 refills | Status: DC | PRN

## 2021-10-21 MED ORDER — OLMESARTAN MEDOXOMIL 40 MG PO TABS: 40.0000 mg | ORAL_TABLET | Freq: Every day | ORAL | 11 refills | Status: DC

## 2021-10-21 MED ORDER — INSULIN GLARGINE 100 UNIT/ML SOLOSTAR PEN: 20.0000 [IU] | PEN_INJECTOR | Freq: Two times a day (BID) | SUBCUTANEOUS | 11 refills | Status: DC

## 2021-10-21 MED ORDER — BUTALBITAL-APAP-CAFFEINE 50-325-40 MG PO TABS: 1.0000 | ORAL_TABLET | Freq: Four times a day (QID) | ORAL | 0 refills | Status: DC | PRN

## 2021-10-21 MED ORDER — BISACODYL 5 MG PO TBEC: 5.0000 mg | DELAYED_RELEASE_TABLET | Freq: Every day | ORAL | 0 refills | Status: DC | PRN

## 2021-10-21 NOTE — Inpatient Diabetes Management (Signed)
Inpatient Diabetes Program Recommendations  AACE/ADA: New Consensus Statement on Inpatient Glycemic Control (2015)  Target Ranges:  Prepandial:   less than 140 mg/dL      [facility] postprandial:   less than 180 mg/dL (1-2 hours)      Critically ill patients:  140 - 180 mg/dL   Lab Results  Component Value Date   GLUCAP 253 (H) 10/21/2021   HGBA1C 13.7 (H) 10/15/2021    Review of Glycemic Control  Latest Reference Range & Units 10/20/21 07:53 10/20/21 11:58 10/20/21 16:15 10/20/21 20:07 10/21/21 07:36  Glucose-Capillary 70 - 99 mg/dL 248 (H) 244 (H) 214 (H) 204 (H) 253 (H)    Diabetes history: DM 2 Outpatient Diabetes medications: per pt report only taking Humalog SSI and Trulicity 4.5 mg every week, Farxiga 10 mg Daily Current orders for Inpatient glycemic control:  Novolog 0-20 units tid + hs Semglee 20 units bid  Pt feels hypoglycemic under 200 mg/dl Glucose trends in 300-400 range.   Inpatient Diabetes Program Recommendations:    -  Increase Semglee to 25 units bid   Thanks,  Tama Headings RN, MSN, BC-ADM Inpatient Diabetes Coordinator Team Pager [phone] (8a-5p)

## 2021-10-21 NOTE — TOC Progression Note (Addendum)
Transition of Care [facility]) - Progression Note    Patient Details  Name: [name] MRN: [id number] Date of Birth: May 11, 1965  Transition of Care [facility]) CM/SW Contact  Joanne Chars, LCSW Phone Number: 10/21/2021, 8:45 AM  Clinical Narrative:  Auth approved in [place]: [id number], [id number], 3 days: 10/3-10/5.  MD informed.   1300: CSW discussed transport with pt.  Pt reports no family able to take her.  Discussed with [facility] supervisor Denyse Amass, advised to refer to [facility] transport--spoke with [place], he is in a meeting, cannot transport.  Advised can refer to [facility] but pt would need to pay, discussed with pt who initially said yes, estimate from [facility] for ride to [place] was $150, pt cannot afford this.  Daughter contacted by phone, discussed, and she will get her husband to come pick pt up.  RN advised.  Expected Discharge Plan: Skilled Nursing Facility Barriers to Discharge: Continued Medical Work up, Ship broker, SNF Pending bed offer  Expected Discharge Plan and Services Expected Discharge Plan: [place] Choice: [place] arrangements for the past 2 months: Single Family Home                                       Social Determinants of Health (SDOH) Interventions    Readmission Risk Interventions    06/11/2021   11:43 AM 05/19/2021   12:02 PM 04/24/2021    3:02 PM  Readmission Risk Prevention Plan  Transportation Screening Complete Complete Complete  PCP or Specialist Appt within 5-7 Days  Complete   Home Care Screening  Complete   Medication Review (RN CM)  Complete   HRI or Home Care Consult   Complete  Palliative Care Screening   Not Applicable  Medication Review (RN Care Manager) Complete  Complete  PCP or Specialist appointment within 3-5 days of discharge Complete    HRI or [place] Complete    SW Recovery Care/Counseling Consult Complete    [place] Not Applicable

## 2021-10-21 NOTE — Plan of Care (Signed)
  Problem: Education: Goal: Ability to describe self-care measures that may prevent or decrease complications (Diabetes Survival Skills Education) will improve Outcome: Adequate for Discharge Goal: Individualized Educational Video(s) Outcome: Adequate for Discharge   Problem: Coping: Goal: Ability to adjust to condition or change in health will improve Outcome: Adequate for Discharge   Problem: Fluid Volume: Goal: Ability to maintain a balanced intake and output will improve Outcome: Adequate for Discharge   Problem: Health Behavior/Discharge Planning: Goal: Ability to identify and utilize available resources and services will improve Outcome: Adequate for Discharge Goal: Ability to manage health-related needs will improve Outcome: Adequate for Discharge   Problem: Metabolic: Goal: Ability to maintain appropriate glucose levels will improve Outcome: Adequate for Discharge   Problem: Nutritional: Goal: Maintenance of adequate nutrition will improve Outcome: Adequate for Discharge Goal: Progress toward achieving an optimal weight will improve Outcome: Adequate for Discharge   Problem: Skin Integrity: Goal: Risk for impaired skin integrity will decrease Outcome: Adequate for Discharge   Problem: Tissue Perfusion: Goal: Adequacy of tissue perfusion will improve Outcome: Adequate for Discharge   Problem: Education: Goal: Knowledge of General Education information will improve Description: Including pain rating scale, medication(s)/side effects and non-pharmacologic comfort measures Outcome: Adequate for Discharge   Problem: Health Behavior/Discharge Planning: Goal: Ability to manage health-related needs will improve Outcome: Adequate for Discharge   Problem: Clinical Measurements: Goal: Ability to maintain clinical measurements within normal limits will improve Outcome: Adequate for Discharge Goal: Will remain free from infection Outcome: Adequate for Discharge Goal:  Diagnostic test results will improve Outcome: Adequate for Discharge Goal: Respiratory complications will improve Outcome: Adequate for Discharge Goal: Cardiovascular complication will be avoided Outcome: Adequate for Discharge   Problem: Activity: Goal: Risk for activity intolerance will decrease Outcome: Adequate for Discharge   Problem: Nutrition: Goal: Adequate nutrition will be maintained Outcome: Adequate for Discharge   Problem: Coping: Goal: Level of anxiety will decrease Outcome: Adequate for Discharge   Problem: Elimination: Goal: Will not experience complications related to bowel motility Outcome: Adequate for Discharge Goal: Will not experience complications related to urinary retention Outcome: Adequate for Discharge   Problem: Pain Managment: Goal: General experience of comfort will improve Outcome: Adequate for Discharge   Problem: Safety: Goal: Ability to remain free from injury will improve Outcome: Adequate for Discharge   Problem: Skin Integrity: Goal: Risk for impaired skin integrity will decrease Outcome: Adequate for Discharge   Problem: Acute Rehab PT Goals(only PT should resolve) Goal: Pt Will Go Supine/Side To Sit Outcome: Adequate for Discharge Goal: Pt Will Go Sit To Supine/Side Outcome: Adequate for Discharge Goal: Patient Will Transfer Sit To/From Stand Outcome: Adequate for Discharge Goal: Pt Will Ambulate Outcome: Adequate for Discharge

## 2021-10-21 NOTE — Progress Notes (Signed)
Pt refused to wear CPAP tonight.

## 2021-10-21 NOTE — TOC Transition Note (Signed)
Transition of Care [facility]) - CM/SW Discharge Note   Patient Details  Name: Krista Godsil MRN: [id number] Date of Birth: November 09, 1965  [facility] CM/SW Contact:  Joanne Chars, LCSW Phone Number: 10/21/2021, 1:20 PM   Clinical Narrative:   Pt discharging to [facility].  RN call report to [phone].      Final next level of care: Skilled Nursing Facility Barriers to Discharge: Barriers Resolved   Patient Goals and CMS Choice Patient states their goals for this hospitalization and ongoing recovery are:: Pt would like to get stronger. CMS Medicare.gov Compare Post Acute Care list provided to:: Patient Choice offered to / list presented to : Patient, Adult Children  Discharge Placement              Patient chooses bed at:  ([facility]) Patient to be transferred to facility by: son in law Name of family member notified: daughter Charlann Lange Patient and family notified of of transfer: 10/21/21  Discharge Plan and Services     Post Acute Care Choice: [place]                               Social Determinants of Health ([place]) Interventions     Readmission Risk Interventions    06/11/2021   11:43 AM 05/19/2021   12:02 PM 04/24/2021    3:02 PM  Readmission Risk Prevention Plan  Transportation Screening Complete Complete Complete  PCP or Specialist Appt within 5-7 Days  Complete   Home Care Screening  Complete   Medication Review (RN CM)  Complete   HRI or Home Care Consult   Complete  Palliative Care Screening   Not Applicable  Medication Review (RN Care Manager) Complete  Complete  PCP or Specialist appointment within 3-5 days of discharge Complete    HRI or [place] Complete    SW Recovery Care/Counseling Consult Complete    [place] Not Applicable

## 2021-10-21 NOTE — Plan of Care (Signed)

## 2021-10-21 NOTE — Discharge Summary (Signed)
Physician Discharge Summary   Patient: [name] MRN: [id number] DOB: October 11, 1965  Admit date:     10/15/2021  Discharge date: 10/21/21  Discharge Physician: Oswald Hillock   PCP: Bo Merino, [name]   Recommendations at discharge:   Patient will be  discharged to nursing facility  Discharge Diagnoses: Principal Problem:   Acute kidney injury [facility])  Resolved Problems:   * No resolved hospital problems. *  Hospital Course: 56 y.o. female, with medical history of hypertension, prior stroke with left-sided deficits, seizures on medication, COPD, diabetes mellitus type 2, aortic root dilatation, diastolic dysfunction, left eye blindness came to ED after fall.  Patient was admitted with acute kidney injury, dehydration, syncope, possible seizure.    Assessment and Plan:  Acute kidney injury -Secondary to poor p.o. intake, diarrhea; creatinine peaked at 2.93 -Patient is also on Lasix 20 mg daily along with HCTZ 50 mg daily; both medications currently on hold -Renal function is improving, today creatinine is down to 1.21, likely at baseline -Patient is eating and drinking okay, IV fluids are on hold -CT renal stone study was negative for hydronephrosis, hydroureter; only showed small nonobstructing renal stones bilaterally     Syncope /fall -Patient fell likely from syncope in the setting of dehydration as above -Orthostatic vital signs were negative -Continue monitoring on telemetry -PT evaluation obtained; plan for skilled nursing facility for rehab   History of seizure -Patient is on Keppra at home -There was concern for seizure, EEG obtained-result is negative for epileptiform discharges -Continue Keppra   History of stroke -Continue Plavix   Abdominal distention/GERD -Improved with Protonix, simethicone -Continue Protonix 40 mg p.o. daily   Diabetes mellitus type 2 -CBG is elevated; patient says her usual blood sugar runs around 200-300 -She becomes  symptomatic with hypoglycemia if blood sugar less than 200 -Started on resistant sliding scale with NovoLog -CBG is now between 200 and 300s; which is patient's baseline -Continue sliding scale insulin with NovoLog -Continue Lantus 20 subcu twice daily -Discontinue insulin pump     History of hypertension -Coreg, Benicar HCT,  were held due to hypotension -Blood pressure is now elevated -We will slowly restart home medications, will start with Coreg 25 mg p.o. twice daily -Continue hydralazine 10 mg IV 6 hours as needed for BP greater than 170 -We will start low-dose Lasix 20 mg daily which patient was taking at home -We will discontinue hydrochlorothiazide, hydralazine -Continue Coreg, olmesartan and Lasix   Hyperlipidemia -Restart Crestor, Zetia  History of asthma -Continue albuterol nebulizer as needed -Continue Wixela inhaler twice a day       Consultants:  Procedures performed:  Disposition: Skilled nursing facility Diet recommendation:  Discharge Diet Orders (From admission, onward)     Start     Ordered   10/21/21 0000  Diet - low sodium heart healthy        10/21/21 1215           Cardiac and Carb modified diet DISCHARGE MEDICATION: Allergies as of 10/21/2021       Reactions   Codeine Swelling, Other (See Comments)   Swelling and burning of mouth (inside)   Lactose Intolerance (gi) Nausea And Vomiting        Medication List     STOP taking these medications    budesonide 0.5 MG/2ML nebulizer solution Commonly known as: PULMICORT   hydrALAZINE 50 MG tablet Commonly known as: APRESOLINE   hydrochlorothiazide 50 MG tablet Commonly known as: HYDRODIURIL  nitroGLYCERIN 0.4 MG SL tablet Commonly known as: NITROSTAT   olmesartan-hydrochlorothiazide 40-25 MG tablet Commonly known as: BENICAR HCT   Omnipod DASH Pods (Gen 4) Misc   oxyCODONE-acetaminophen 5-325 MG tablet Commonly known as: PERCOCET/ROXICET   Pen Needles 32G X 4 MM Misc    potassium chloride SA 20 MEQ tablet Commonly known as: Klor-Con M20       TAKE these medications    albuterol 1.25 MG/3ML nebulizer solution Commonly known as: ACCUNEB Inhale 1 ampule into the lungs every 6 (six) hours as needed for wheezing or shortness of breath. What changed: Another medication with the same name was changed. Make sure you understand how and when to take each.   albuterol 108 (90 Base) MCG/ACT inhaler Commonly known as: ProAir HFA INHALE 2 PUFFS BY MOUTH EVERY 6 HOURS AS NEEDED FOR WHEEZING OR SHORTNESS OF BREATH What changed:  how much to take how to take this when to take this reasons to take this   amitriptyline 25 MG tablet Commonly known as: ELAVIL Take 2-3 tablets (50-75 mg total) by mouth at bedtime. What changed: how much to take   baclofen 10 MG tablet Commonly known as: LIORESAL Take 1 tablet (10 mg total) by mouth 3 (three) times daily as needed for muscle spasms. What changed: when to take this   bisacodyl 5 MG EC tablet Commonly known as: DULCOLAX Take 1 tablet (5 mg total) by mouth daily as needed for moderate constipation. What changed:  when to take this reasons to take this   butalbital-acetaminophen-caffeine 50-325-40 MG tablet Commonly known as: FIORICET Take 1-2 tablets by mouth every 6 (six) hours as needed for headache.   carvedilol 25 MG tablet Commonly known as: COREG Take 25 mg by mouth 2 (two) times daily.   cholecalciferol 25 MCG (1000 UNIT) tablet Commonly known as: VITAMIN D3 Take 1,000 Units by mouth daily.   ciclopirox 8 % solution Commonly known as: PENLAC Apply 1 Application topically at bedtime.   cloNIDine 0.1 MG tablet Commonly known as: CATAPRES Take 0.1 mg by mouth daily as needed (For blood pressure overm160/90).   clopidogrel 75 MG tablet Commonly known as: PLAVIX Take 1 tablet (75 mg total) by mouth daily.   cycloSPORINE 0.05 % ophthalmic emulsion Commonly known as: RESTASIS Place 2 drops  into both eyes 2 (two) times daily.   DULoxetine 60 MG capsule Commonly known as: Cymbalta Take 1 capsule (60 mg total) by mouth daily.   EPINEPHrine 0.3 mg/0.3 mL Soaj injection Commonly known as: EPI-PEN Inject 0.3 mg into the muscle as needed for anaphylaxis.   ezetimibe 10 MG tablet Commonly known as: ZETIA Take 1 tablet (10 mg total) by mouth daily.   fluticasone 50 MCG/ACT nasal spray Commonly known as: FLONASE Place 2 sprays into both nostrils daily.   furosemide 20 MG tablet Commonly known as: LASIX Take 20 mg by mouth.   gabapentin 600 MG tablet Commonly known as: Neurontin 600 mg qAM, 600 mg qPM, 1200 mg QHS What changed:  how much to take how to take this when to take this additional instructions   Gvoke HypoPen 2-Pack 1 MG/0.2ML Soaj Generic drug: Glucagon Inject 1 mg into the skin once as needed for up to 1 dose (for hypoglycemia secondary to insulin).   HumaLOG KwikPen 200 UNIT/ML KwikPen Generic drug: insulin lispro INJECT UP TO 160 UNITS DAILY IN DIVIDED DOSES AS DIRECTED What changed: See the new instructions.   insulin aspart 100 UNIT/ML injection Commonly known as:  novoLOG Inject 0-20 Units into the skin 3 (three) times daily with meals. Sliding scale insulin Less than 70 initiate hypoglycemia protocol 70-120  0 units 121-150 3 units 151-200 4 units 201-250 7 units 251-300 11 units 301-350 15 units 351-400  20 units Greater than 400 call MD   insulin glargine 100 UNIT/ML Solostar Pen Commonly known as: LANTUS Inject 20 Units into the skin 2 (two) times daily.   levETIRAcetam 500 MG tablet Commonly known as: KEPPRA Take 500-750 mg by mouth See admin instructions. Take 1 tablet (557m) by mouth every morning and take 1 tablets by mouth (7532m by mouth every night   meclizine 25 MG tablet Commonly known as: ANTIVERT Take 1 tablet (25 mg total) by mouth 2 (two) times daily as needed for dizziness.   mirabegron ER 50 MG Tb24 tablet Commonly  known as: Myrbetriq Take 1 tablet (50 mg total) by mouth daily.   olmesartan 40 MG tablet Commonly known as: BENICAR Take 1 tablet (40 mg total) by mouth daily.   ondansetron 8 MG disintegrating tablet Commonly known as: ZOFRAN-ODT Take 1 tablet (8 mg total) by mouth every 8 (eight) hours as needed for nausea or vomiting.   oxyCODONE 5 MG immediate release tablet Commonly known as: Oxy IR/ROXICODONE Take 1 tablet (5 mg total) by mouth every 6 (six) hours as needed for breakthrough pain or severe pain.   pantoprazole 40 MG tablet Commonly known as: PROTONIX Take 1 tablet (40 mg total) by mouth daily.   polyethylene glycol 17 g packet Commonly known as: MIRALAX / GLYCOLAX Take 17 g by mouth daily as needed for mild constipation.   rosuvastatin 10 MG tablet Commonly known as: CRESTOR Take 1 tablet (10 mg total) by mouth daily.   senna-docusate 8.6-50 MG tablet Commonly known as: Senokot-S Take 1 tablet by mouth at bedtime as needed for mild constipation. What changed:  when to take this reasons to take this   sodium phosphate 7-19 GM/118ML Enem Place 133 mLs (1 enema total) rectally daily as needed for severe constipation.   Trulicity 4.5 MGUD/1.4HFopn Generic drug: Dulaglutide Inject 4.5 mg as directed once a week.   Wixela Inhub 250-50 MCG/ACT Aepb Generic drug: fluticasone-salmeterol Inhale 1 puff into the lungs 2 (two) times daily.        Discharge Exam: There were no vitals filed for this visit. General-appears in no acute distress Heart-S1-S2, regular, no murmur auscultated Lungs-clear to auscultation bilaterally, no wheezing or crackles auscultated Abdomen-soft, nontender, no organomegaly Extremities-no edema in the lower extremities Neuro-alert, oriented x3, no focal deficit noted  Condition at discharge: good  The results of significant diagnostics from this hospitalization (including imaging, microbiology, ancillary and laboratory) are listed below  for reference.   Imaging Studies: DG Abd 1 View  Result Date: 10/18/2021 CLINICAL DATA:  Abdominal distension. EXAM: ABDOMEN - 1 VIEW COMPARISON:  CT, 10/15/2021. FINDINGS: Normal bowel gas pattern. Specifically, no bowel dilation to suggest obstruction. Soft tissues poorly defined. Intrarenal stones noted on the recent prior CT are not visualized radiographically. No acute skeletal abnormality. IMPRESSION: 1. No acute findings.  No evidence of bowel obstruction. Electronically Signed   By: DaLajean Manes.D.   On: 10/18/2021 12:06   EEG adult  Result Date: 10/16/2021 YaLora HavensMD     10/16/2021  8:29 AM Patient Name: LaJosefita WeissmannRN: [id number] Attending: PrLora Havenseferring Physician/Provider: LaOswald HillockMD Date: 10/16/2021 Duration: 27.31 mins Patient history: [age]ear old female with  history of seizures presented with syncope.  EEG to evaluate for seizure. Level of alertness: Awake, asleep AEDs during EEG study: LEV Technical aspects: This EEG study was done with scalp electrodes positioned according to the 10-20 International system of electrode placement. Electrical activity was reviewed with band pass filter of 1-70Hz , sensitivity of 7 uV/mm, display speed of 61m/sec with a 60Hz  notched filter applied as appropriate. EEG data were recorded continuously and digitally stored.  Video monitoring was available and reviewed as appropriate. Description: The posterior dominant rhythm consists of 8 Hz activity of moderate voltage (25-35 uV) seen predominantly in posterior head regions, symmetric and reactive to eye opening and eye closing. Sleep was characterized by vertex waves, sleep spindles (12 to 14 Hz), maximal frontocentral region. Hyperventilation and photic stimulation were not performed.   IMPRESSION: This study is within normal limits. No seizures or epileptiform discharges were seen throughout the recording. A normal interictal EEG does not exclude nor support  the diagnosis of epilepsy. PLora Havens  CT Renal Stone Study  Result Date: 10/15/2021 CLINICAL DATA:  New acute kidney injury, history of kidney stones, fell today with head injury, takes Plavix, history diabetes mellitus, flank pain EXAM: CT ABDOMEN AND PELVIS WITHOUT CONTRAST TECHNIQUE: Multidetector CT imaging of the abdomen and pelvis was performed following the standard protocol without IV contrast. RADIATION DOSE REDUCTION: This exam was performed according to the departmental dose-optimization program which includes automated exposure control, adjustment of the mA and/or kV according to patient size and/or use of iterative reconstruction technique. COMPARISON:  08/07/2021 FINDINGS: Lower chest: Minimal dependent atelectasis at lung bases Hepatobiliary: Gallbladder and liver normal appearance Pancreas: Normal appearance Spleen: Normal appearance Adrenals/Urinary Tract: LEFT adrenal gland normal. RIGHT adrenal mass 2.7 x 1.8 cm, average attenuation 9 HU consistent with lipid rich adenoma; no follow-up imaging recommended. Small BILATERAL nonobstructing renal calculi. No renal masses, hydronephrosis, hydroureter, or ureteral calcification. Bladder unremarkable. Stomach/Bowel: Sigmoid diverticulosis without evidence of diverticulitis. Scattered stool throughout colon. Normal appendix. Stomach and bowel loops otherwise normal appearance. Vascular/Lymphatic: Aorta normal caliber.  No adenopathy. Reproductive: Uterus surgically absent.  Normal appearing ovaries. Other: No free air or free fluid. Ventral surgical scar at midline. No hernia. Musculoskeletal: Wire versus tubing fragment superficial to RIGHT latissimus unchanged. No fractures. IMPRESSION: Small BILATERAL nonobstructing renal calculi. Sigmoid diverticulosis without evidence of diverticulitis. Lipid rich RIGHT adrenal adenoma 2.7 x 1.8 cm; no follow-up imaging recommended. No acute intra-abdominal or intrapelvic abnormalities. Electronically  Signed   By: MLavonia DanaM.D.   On: 10/15/2021 17:11   DG Wrist Complete Left  Result Date: 10/15/2021 CLINICAL DATA:  Trauma, fall, pain EXAM: LEFT WRIST - COMPLETE 3+ VIEW COMPARISON:  None Available. FINDINGS: No fracture or dislocation is seen. There are no opaque foreign bodies. IMPRESSION: No fracture or dislocation is seen in left wrist. Electronically Signed   By: PElmer PickerM.D.   On: 10/15/2021 14:05   CT Head Wo Contrast  Result Date: 10/15/2021 CLINICAL DATA:  Provided history: Head trauma, intracranial venous injury suspected. Neck trauma, midline tenderness. EXAM: CT HEAD WITHOUT CONTRAST CT CERVICAL SPINE WITHOUT CONTRAST TECHNIQUE: Multidetector CT imaging of the head and cervical spine was performed following the standard protocol without intravenous contrast. Multiplanar CT image reconstructions of the cervical spine were also generated. RADIATION DOSE REDUCTION: This exam was performed according to the departmental dose-optimization program which includes automated exposure control, adjustment of the mA and/or kV according to patient size and/or use of iterative reconstruction technique.  COMPARISON:  Head CT 07/07/2021. Cervical spine MRI 02/03/2021. FINDINGS: CT HEAD FINDINGS Brain: Mild generalized parenchymal atrophy. Prominent dural calcifications. There is no acute intracranial hemorrhage. No demarcated cortical infarct. No extra-axial fluid collection. No evidence of an intracranial mass. No midline shift. Vascular: No hyperdense vessel. Atherosclerotic calcifications. Skull: No fracture or aggressive osseous lesion. Redemonstrated small well-corticated defect within the right parietal calvarium, which may reflect sequela of a prior craniotomy. Sinuses/Orbits: No orbital mass or acute orbital finding. Left greater than right proptosis. No significant paranasal sinus disease at the imaged levels. CT CERVICAL SPINE FINDINGS Alignment: Straightening of the expected cervical  lordosis. No significant spondylolisthesis. Skull base and vertebrae: The basion-dental and atlanto-dental intervals are maintained.No evidence of acute fracture to the cervical spine. 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 Soft tissues and spinal canal: No prevertebral fluid or swelling. No visible canal hematoma. Disc levels: Cervical spondylosis with multilevel disc space narrowing, disc bulges/central disc protrusions and uncovertebral hypertrophy. Disc space narrowing is greatest at C4-C5, C5-C6 and C6-C7 (moderate at these levels). Multilevel spinal canal narrowing. Most notably at C5-C6, a disc protrusion contributes to at least moderate spinal canal stenosis. Bilateral bony neural foraminal narrowing also present at this level. Ventral osteophytes at C5-C6 and C6-C7. Upper chest: No consolidation within the imaged lung apices. No visible pneumothorax. IMPRESSION: CT head: 1. No evidence of acute intracranial abnormality. 2. Mild generalized parenchymal atrophy. 3. Left greater than right proptosis. CT cervical spine: 1. No evidence of acute fracture to the cervical spine. 2. Nonspecific straightening of the expected cervical lordosis. 3. Cervical spondylosis. Most notably at C5-C6, a disc protrusion contributes to at least moderate spinal canal stenosis. Electronically Signed   By: Kellie Simmering D.O.   On: 10/15/2021 13:20   CT Cervical Spine Wo Contrast  Result Date: 10/15/2021 CLINICAL DATA:  Provided history: Head trauma, intracranial venous injury suspected. Neck trauma, midline tenderness. EXAM: CT HEAD WITHOUT CONTRAST CT CERVICAL SPINE WITHOUT CONTRAST TECHNIQUE: Multidetector CT imaging of the head and cervical spine was performed following the standard protocol without intravenous contrast. Multiplanar CT image reconstructions of the cervical spine were also generated. RADIATION DOSE REDUCTION: This exam was performed according to the departmental dose-optimization program  which includes automated exposure control, adjustment of the mA and/or kV according to patient size and/or use of iterative reconstruction technique. COMPARISON:  Head CT 07/07/2021. Cervical spine MRI 02/03/2021. FINDINGS: CT HEAD FINDINGS Brain: Mild generalized parenchymal atrophy. Prominent dural calcifications. There is no acute intracranial hemorrhage. No demarcated cortical infarct. No extra-axial fluid collection. No evidence of an intracranial mass. No midline shift. Vascular: No hyperdense vessel. Atherosclerotic calcifications. Skull: No fracture or aggressive osseous lesion. Redemonstrated small well-corticated defect within the right parietal calvarium, which may reflect sequela of a prior craniotomy. Sinuses/Orbits: No orbital mass or acute orbital finding. Left greater than right proptosis. No significant paranasal sinus disease at the imaged levels. CT CERVICAL SPINE FINDINGS Alignment: Straightening of the expected cervical lordosis. No significant spondylolisthesis. Skull base and vertebrae: The basion-dental and atlanto-dental intervals are maintained.No evidence of acute fracture to the cervical spine. 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 Soft tissues and spinal canal: No prevertebral fluid or swelling. No visible canal hematoma. Disc levels: Cervical spondylosis with multilevel disc space narrowing, disc bulges/central disc protrusions and uncovertebral hypertrophy. Disc space narrowing is greatest at C4-C5, C5-C6 and C6-C7 (moderate at these levels). Multilevel spinal canal narrowing. Most notably at C5-C6, a disc protrusion contributes to  at least moderate spinal canal stenosis. Bilateral bony neural foraminal narrowing also present at this level. Ventral osteophytes at C5-C6 and C6-C7. Upper chest: No consolidation within the imaged lung apices. No visible pneumothorax. IMPRESSION: CT head: 1. No evidence of acute intracranial abnormality. 2. Mild generalized parenchymal  atrophy. 3. Left greater than right proptosis. CT cervical spine: 1. No evidence of acute fracture to the cervical spine. 2. Nonspecific straightening of the expected cervical lordosis. 3. Cervical spondylosis. Most notably at C5-C6, a disc protrusion contributes to at least moderate spinal canal stenosis. Electronically Signed   By: Kellie Simmering D.O.   On: 10/15/2021 13:20   CT Lumbar Spine Wo Contrast  Result Date: 10/15/2021 CLINICAL DATA:  Fall, back injury EXAM: CT LUMBAR SPINE WITHOUT CONTRAST TECHNIQUE: Multidetector CT imaging of the lumbar spine was performed without intravenous contrast administration. Multiplanar CT image reconstructions were also generated. RADIATION DOSE REDUCTION: This exam was performed according to the departmental dose-optimization program which includes automated exposure control, adjustment of the mA and/or kV according to patient size and/or use of iterative reconstruction technique. COMPARISON:  None Available. FINDINGS: Segmentation: 5 lumbar vertebra Alignment: Negative for fracture Vertebrae:  Negative for fracture or mass Paraspinal and other soft tissues: Negative for paraspinous mass or adenopathy. 10 mm right lower pole calculus. Tiny left lower pole calculus. No hydronephrosis. Disc levels: L1-2: Disc degeneration with prominent posterior osteophyte causing mild spinal stenosis. Mild facet degeneration L2-3: Small calcified disc protrusion on the right. No significant stenosis L3-4: Mild disc and facet degeneration.  Negative for stenosis. L4-5: Mild disc bulging and facet degeneration. Negative for stenosis L5-S1: Mild facet degeneration.  Negative for stenosis IMPRESSION: Negative for lumbar fracture. Electronically Signed   By: Franchot Gallo M.D.   On: 10/15/2021 13:10    Microbiology: Results for orders placed or performed during the hospital encounter of 07/07/21  Urine Culture     Status: Abnormal   Collection Time: 07/07/21 10:05 AM   Specimen: Urine,  Random  Result Value Ref Range Status   Specimen Description   Final    URINE, RANDOM Performed at [facility], [address], [place], [place] [address]    Special Requests   Final    NONE Performed at [facility], [address] [place], [place] [address]    Culture >=100,000 COLONIES/mL ESCHERICHIA COLI (A)  Final   Report Status 07/09/2021 FINAL  Final   Organism ID, Bacteria ESCHERICHIA COLI (A)  Final      Susceptibility   Escherichia coli - MIC*    AMPICILLIN >=32 RESISTANT Resistant     CEFAZOLIN <=4 SENSITIVE Sensitive     CEFEPIME <=0.12 SENSITIVE Sensitive     CEFTRIAXONE <=0.25 SENSITIVE Sensitive     CIPROFLOXACIN 1 RESISTANT Resistant     GENTAMICIN <=1 SENSITIVE Sensitive     IMIPENEM <=0.25 SENSITIVE Sensitive     NITROFURANTOIN 64 INTERMEDIATE Intermediate     TRIMETH/SULFA <=20 SENSITIVE Sensitive     AMPICILLIN/SULBACTAM 16 INTERMEDIATE Intermediate     PIP/TAZO <=4 SENSITIVE Sensitive     * >=100,000 COLONIES/mL ESCHERICHIA COLI   *Note: Due to a large number of results and/or encounters for the requested time period, some results have not been displayed. A complete set of results can be found in Results Review.    Labs: CBC: Recent Labs  Lab 10/15/21 1213 10/16/21 0828  WBC 11.1* 7.7  HGB 15.3* 13.7  HCT 46.7* 42.8  MCV 87.3 89.4  PLT 303  146   Basic Metabolic Panel: Recent Labs  Lab 10/16/21 0828 10/17/21 0302 10/18/21 0329 10/19/21 0427 10/20/21 1102  NA 134* 132* 136 136 136  K 3.7 4.0 3.5 3.8 3.7  CL 93* 97* 94* 103 105  CO2 28 19* 28 25 25   GLUCOSE 425* 344* 300* 215* 249*  BUN 31* 29* 18 11 9   CREATININE 1.93* 1.46* 1.20* 1.19* 1.21*  CALCIUM 9.4 8.8* 9.8 8.9 8.8*  MG  --   --  2.1  --   --    Liver Function Tests: Recent Labs  Lab 10/15/21 1213 10/16/21 0828  AST 21 17  ALT 15 16  ALKPHOS 82 77  BILITOT 0.8 0.7  PROT 8.2* 7.2  ALBUMIN 4.0 3.5   CBG: Recent Labs  Lab 10/20/21 1158  10/20/21 1615 10/20/21 2007 10/21/21 0736 10/21/21 1117  GLUCAP 244* 214* 204* 253* 263*    Discharge time spent: greater than 30 minutes.  Signed: Oswald Hillock, MD [facility] 10/21/2021

## 2021-10-23 ENCOUNTER — Other Ambulatory Visit: Payer: Self-pay | Admitting: Family Medicine

## 2021-10-23 DIAGNOSIS — M62838 Other muscle spasm: Secondary | ICD-10-CM

## 2021-10-23 DIAGNOSIS — G894 Chronic pain syndrome: Secondary | ICD-10-CM

## 2021-10-27 ENCOUNTER — Other Ambulatory Visit: Payer: Self-pay | Admitting: Endocrinology

## 2021-11-07 ENCOUNTER — Telehealth: Payer: Self-pay | Admitting: Nurse Practitioner

## 2021-11-07 NOTE — Telephone Encounter (Signed)
Tiffany calling from [place] is calling to report not able to go out until Monday 11/10/21 for PT & OT  Cb- [phone] Option 2

## 2021-11-07 NOTE — Telephone Encounter (Signed)
Per [facility] notified it is ok to go out to see patient for PT and OT

## 2021-11-11 ENCOUNTER — Telehealth: Payer: Self-pay | Admitting: Nurse Practitioner

## 2021-11-11 NOTE — Telephone Encounter (Signed)
Copied from [place] [id number]. Topic: General - Other >> Nov 11, 2021 11:18 AM [name] wrote: [place] Verbal Orders - Caller/Agency: [place] Number: [phone] Requesting OT/PT/Skilled Nursing/Social Work/Speech Therapy: PT Frequency: 2w3, 1w5 Gerald Stabs noticed a level 2 drug interaction with carvedilol (COREG) 25 MG tablet, cloNIDine (CATAPRES) 0.1 MG tablet and WIXELA INHUB 250-50 MCG/DOSE AEPB

## 2021-11-11 NOTE — Telephone Encounter (Signed)
FYI

## 2021-11-12 ENCOUNTER — Other Ambulatory Visit: Payer: Self-pay | Admitting: Nurse Practitioner

## 2021-11-12 ENCOUNTER — Telehealth: Payer: Self-pay | Admitting: Nurse Practitioner

## 2021-11-12 NOTE — Telephone Encounter (Signed)
Appt scheduled with [name] for 10.30.2023

## 2021-11-12 NOTE — Telephone Encounter (Signed)
Schedule her appointment with Kristeen Miss for follow up medication and referrals after hospital visit

## 2021-11-12 NOTE — Telephone Encounter (Signed)
Copied from [facility] [id number]. Topic: Referral - Request for Referral >> Nov 12, 2021  8:55 AM Everette C wrote: Has patient seen PCP for this complaint? No. *If NO, is insurance requiring patient see PCP for this issue before PCP can refer them? Referral for which specialty: Nephrology  Preferred provider/office: Patient has no preference but would like to be seen in [place] at a facility associated with [facility]  Reason for referral: kidney concerns

## 2021-11-12 NOTE — Telephone Encounter (Signed)
[name] at [facility] notified

## 2021-11-12 NOTE — Telephone Encounter (Signed)
Please advise 

## 2021-11-17 ENCOUNTER — Ambulatory Visit (INDEPENDENT_AMBULATORY_CARE_PROVIDER_SITE_OTHER): Payer: Medicare Other | Admitting: Family Medicine

## 2021-11-17 ENCOUNTER — Encounter (INDEPENDENT_AMBULATORY_CARE_PROVIDER_SITE_OTHER): Payer: Self-pay

## 2021-11-17 ENCOUNTER — Encounter: Payer: Self-pay | Admitting: Family Medicine

## 2021-11-17 VITALS — BP 132/80 | HR 84 | Temp 98.3°F | Resp 16 | Ht 66.5 in | Wt 230.6 lb

## 2021-11-17 DIAGNOSIS — E1165 Type 2 diabetes mellitus with hyperglycemia: Secondary | ICD-10-CM | POA: Diagnosis not present

## 2021-11-17 DIAGNOSIS — Z09 Encounter for follow-up examination after completed treatment for conditions other than malignant neoplasm: Secondary | ICD-10-CM | POA: Diagnosis not present

## 2021-11-17 DIAGNOSIS — E119 Type 2 diabetes mellitus without complications: Secondary | ICD-10-CM | POA: Insufficient documentation

## 2021-11-17 DIAGNOSIS — Z794 Long term (current) use of insulin: Secondary | ICD-10-CM

## 2021-11-17 DIAGNOSIS — N179 Acute kidney failure, unspecified: Secondary | ICD-10-CM | POA: Diagnosis not present

## 2021-11-17 DIAGNOSIS — I10 Essential (primary) hypertension: Secondary | ICD-10-CM

## 2021-11-17 DIAGNOSIS — E162 Hypoglycemia, unspecified: Secondary | ICD-10-CM

## 2021-11-17 MED ORDER — BLOOD GLUCOSE TEST VI STRP: ORAL_STRIP | 5 refills | Status: DC

## 2021-11-17 MED ORDER — LANCETS MISC: 11 refills | Status: DC

## 2021-11-17 MED ORDER — TRULICITY 4.5 MG/0.5ML ~~LOC~~ SOAJ: 4.5000 mg | SUBCUTANEOUS | 1 refills | Status: DC

## 2021-11-17 MED ORDER — GVOKE HYPOPEN 2-PACK 1 MG/0.2ML ~~LOC~~ SOAJ: 1.0000 mg | Freq: Once | SUBCUTANEOUS | 0 refills | Status: DC | PRN

## 2021-11-17 MED ORDER — BLOOD GLUCOSE MONITOR KIT: PACK | 0 refills | Status: DC

## 2021-11-17 NOTE — Progress Notes (Signed)
Name: [name]   MRN: [id number]    DOB: [birth year]   Date:11/17/2021       Progress Note  Chief Complaint  Patient presents with   Hospitalization Follow-up    Pt needs a referral for Nephrologist. Dx at hospital Kidney failure     Subjective:   [name] is a 56 y.o. female, presents to clinic for hospital f/up  Pt admitted 10/15/2021-10/21/2021 for AKI secondary to N/V/D and lasix use, had fall with head abrasion and confusion, then d/c to rehab facility ([facility])  She was there until 11/06/2021 then d/c to home - at home she has no working glucometer right now  In hospital GFR was 18, improved to 50's when d/c Lab Results  Component Value Date   HGBA1C 13.7 (H) 10/15/2021   Taken off insulin pump and dexcom - due to see endo  (has been advised to see them several times with hospitalizations for hypoglycemia she reports not seeing them for 1 year- she needs to call since dexcom and insulin pump d/c, A1C high, severely uncontrolled - pt notes transportation issues preventing endo OV D/C orders for insulin- lantus 20 units BID and resistant DM SSI - Pt reports only giving lantus 20 units once daily and 7-12 units daily- no more than 12 units total, Eating 3 x a day, No meter/glucose readings  She has lost weight Wt Readings from Last 5 Encounters:  11/17/21 230 lb 9.6 oz (104.6 kg)  08/14/21 251 lb 8 oz (114.1 kg)  07/18/21 253 lb (114.8 kg)  07/14/21 254 lb (115.2 kg)  07/07/21 257 lb 8 oz (116.8 kg)   BMI Readings from Last 5 Encounters:  11/17/21 36.66 kg/m  08/14/21 39.99 kg/m  07/18/21 40.22 kg/m  07/14/21 40.38 kg/m  07/07/21 41.56 kg/m   Pt has no records with her from [facility] - HH is coming out to her  She was seeing Almyra Free as PCP but just switched back to me - I have not been getting any of her info or charts since this change was done in the past 2-3 d - no CC to me with any recent hx/hospitalizations/HH orders etc    Current  Outpatient Medications:    albuterol (ACCUNEB) 1.25 MG/3ML nebulizer solution, Inhale 1 ampule into the lungs every 6 (six) hours as needed for wheezing or shortness of breath., Disp: , Rfl:    albuterol (PROAIR HFA) 108 (90 Base) MCG/ACT inhaler, INHALE 2 PUFFS BY MOUTH EVERY 6 HOURS AS NEEDED FOR WHEEZING OR SHORTNESS OF BREATH (Patient taking differently: Inhale 1 puff into the lungs every 4 (four) hours as needed for shortness of breath. INHALE 2 PUFFS BY MOUTH EVERY 6 HOURS AS NEEDED FOR WHEEZING OR SHORTNESS OF BREATH), Disp: 3 Inhaler, Rfl: 3   baclofen (LIORESAL) 10 MG tablet, TAKE 1 TABLET BY MOUTH THREE TIMES A DAY AS NEEDED FOR MUSCLE SPASMS, Disp: 270 tablet, Rfl: 1   bisacodyl (DULCOLAX) 5 MG EC tablet, Take 1 tablet (5 mg total) by mouth daily as needed for moderate constipation., Disp: 30 tablet, Rfl: 0   butalbital-acetaminophen-caffeine (FIORICET) 50-325-40 MG tablet, Take 1-2 tablets by mouth every 6 (six) hours as needed for headache., Disp: 10 tablet, Rfl: 0   carvedilol (COREG) 25 MG tablet, Take 25 mg by mouth 2 (two) times daily., Disp: , Rfl:    cholecalciferol (VITAMIN D3) 25 MCG (1000 UNIT) tablet, Take 1,000 Units by mouth daily., Disp: , Rfl:    ciclopirox (PENLAC) 8 %  solution, Apply 1 Application topically at bedtime., Disp: , Rfl:    cloNIDine (CATAPRES) 0.1 MG tablet, Take 0.1 mg by mouth daily as needed (For blood pressure overm160/90)., Disp: , Rfl:    clopidogrel (PLAVIX) 75 MG tablet, Take 1 tablet (75 mg total) by mouth daily., Disp: 90 tablet, Rfl: 0   Continuous Blood Gluc Receiver (DEXCOM G6 RECEIVER) DEVI, USE TO CHECK BLOOD SUGAR DAILY, Disp: 1 each, Rfl: 0   cycloSPORINE (RESTASIS) 0.05 % ophthalmic emulsion, Place 2 drops into both eyes 2 (two) times daily., Disp: , Rfl:    Dulaglutide (TRULICITY) 4.5 VP/7.1GG SOPN, Inject 4.5 mg as directed once a week., Disp: 2 mL, Rfl: 3   DULoxetine (CYMBALTA) 60 MG capsule, Take 1 capsule (60 mg total) by mouth daily.,  Disp: 90 capsule, Rfl: 3   EPINEPHrine 0.3 mg/0.3 mL IJ SOAJ injection, Inject 0.3 mg into the muscle as needed for anaphylaxis., Disp: , Rfl:    ezetimibe (ZETIA) 10 MG tablet, Take 1 tablet (10 mg total) by mouth daily., Disp: 90 tablet, Rfl: 3   fluticasone (FLONASE) 50 MCG/ACT nasal spray, Place 2 sprays into both nostrils daily., Disp: 16 g, Rfl: 3   furosemide (LASIX) 20 MG tablet, Take 20 mg by mouth., Disp: , Rfl:    gabapentin (NEURONTIN) 600 MG tablet, 600 mg qAM, 600 mg qPM, 1200 mg QHS (Patient taking differently: Take 600-1,200 mg by mouth See admin instructions. Take 1 tablet by mouth in the morning and then take 2 tablets by mouth at bedtime), Disp: 120 tablet, Rfl: 5   GVOKE HYPOPEN 2-PACK 1 MG/0.2ML SOAJ, Inject 1 mg into the skin once as needed for up to 1 dose (for hypoglycemia secondary to insulin)., Disp: 0.4 mL, Rfl: 2   HUMALOG KWIKPEN 200 UNIT/ML KwikPen, INJECT UP TO 160 UNITS DAILY IN DIVIDED DOSES AS DIRECTED (Patient taking differently: Inject 40-100 Units into the skin in the morning, at noon, and at bedtime. Per sliding scale), Disp: 18 mL, Rfl: 2   insulin aspart (NOVOLOG) 100 UNIT/ML injection, Inject 0-20 Units into the skin 3 (three) times daily with meals. Sliding scale insulin Less than 70 initiate hypoglycemia protocol 70-120  0 units 121-150 3 units 151-200 4 units 201-250 7 units 251-300 11 units 301-350 15 units 351-400  20 units Greater than 400 call MD, Disp: 10 mL, Rfl: 11   insulin glargine (LANTUS) 100 UNIT/ML Solostar Pen, Inject 20 Units into the skin 2 (two) times daily., Disp: 15 mL, Rfl: 11   levETIRAcetam (KEPPRA) 500 MG tablet, Take 500-750 mg by mouth See admin instructions. Take 1 tablet (540m) by mouth every morning and take 1 tablets by mouth (7549m by mouth every night, Disp: , Rfl:    meclizine (ANTIVERT) 25 MG tablet, Take 1 tablet (25 mg total) by mouth 2 (two) times daily as needed for dizziness., Disp: 30 tablet, Rfl: 0   mirabegron ER  (MYRBETRIQ) 50 MG TB24 tablet, Take 1 tablet (50 mg total) by mouth daily., Disp: 90 tablet, Rfl: 1   olmesartan (BENICAR) 40 MG tablet, Take 1 tablet (40 mg total) by mouth daily., Disp: 30 tablet, Rfl: 11   ondansetron (ZOFRAN-ODT) 8 MG disintegrating tablet, Take 1 tablet (8 mg total) by mouth every 8 (eight) hours as needed for nausea or vomiting., Disp: 20 tablet, Rfl: 0   oxyCODONE (OXY IR/ROXICODONE) 5 MG immediate release tablet, Take 1 tablet (5 mg total) by mouth every 6 (six) hours as needed for breakthrough pain or  severe pain., Disp: 10 tablet, Rfl: 0   pantoprazole (PROTONIX) 40 MG tablet, Take 1 tablet (40 mg total) by mouth daily., Disp: 30 tablet, Rfl: 3   polyethylene glycol (MIRALAX / GLYCOLAX) 17 g packet, Take 17 g by mouth daily as needed for mild constipation., Disp: 100 each, Rfl: 3   rosuvastatin (CRESTOR) 10 MG tablet, Take 1 tablet (10 mg total) by mouth daily., Disp: 90 tablet, Rfl: 3   senna-docusate (SENOKOT-S) 8.6-50 MG tablet, Take 1 tablet by mouth at bedtime as needed for mild constipation., Disp: 60 tablet, Rfl: 0   sodium phosphate (FLEET) 7-19 GM/118ML ENEM, Place 133 mLs (1 enema total) rectally daily as needed for severe constipation., Disp: 133 mL, Rfl: 2   WIXELA INHUB 250-50 MCG/DOSE AEPB, Inhale 1 puff into the lungs 2 (two) times daily., Disp: , Rfl:    amitriptyline (ELAVIL) 25 MG tablet, Take 2-3 tablets (50-75 mg total) by mouth at bedtime. (Patient taking differently: Take 75 mg by mouth at bedtime.), Disp: 90 tablet, Rfl: 5  Patient Active Problem List   Diagnosis Date Noted   Acute kidney injury ([place]) 10/15/2021   Mild episode of recurrent major depressive disorder ([place]) 08/14/2021   Hypoglycemia 07/07/2021   UTI (urinary tract infection) 07/07/2021   Syncope 07/07/2021   Diarrhea 06/09/2021   Hyperkalemia 06/09/2021   Intractable nausea and vomiting 06/08/2021   Small bowel obstruction ([place]) 05/18/2021   Orthostatic hypotension 04/29/2021    Constipation 04/28/2021   SBO (small bowel obstruction) ([place]) 04/23/2021   Hypertensive urgency 04/23/2021   GERD without esophagitis 04/23/2021   AKI (acute kidney injury) ([place]) 04/23/2021   Dyslipidemia 04/23/2021   Chronic diastolic CHF (congestive heart failure) ([place]) 04/23/2021   Cervical spondylosis 02/12/2021   Lumbar facet arthropathy 02/12/2021   Angina pectoris ([place]) 11/13/2020   Major depressive disorder, recurrent episode, moderate ([place]) 11/13/2020   Adrenal adenoma, right    PAD (peripheral artery disease) ([place]) 01/24/2020   Abnormal ankle brachial index (ABI) 12/18/2019   CAD (coronary artery disease) 04/27/2019   Diabetic polyneuropathy associated with type 2 diabetes mellitus ([place]) 03/02/2018   Vitamin D deficiency 01/24/2018   Overactive bladder 05/18/2017   Dilated aortic root (HCC)    Sacroiliac dysfunction 10/09/2014   Chronic low back pain 09/03/2014   CVA, old, hemiparesis ([place]) 09/03/2014   OSA (obstructive sleep apnea) 05/15/2014   Dyslipidemia associated with type 2 diabetes mellitus ([place]) 01/21/2014   Essential hypertension, benign 01/21/2014   Chronic pain syndrome 01/21/2014   Headache disorder 09/07/2013   Mixed urge and stress incontinence 08/25/2011   Ureteral stricture, right 08/25/2011   Nephrolithiasis 08/22/2011   Incisional hernia 05/10/2011   Obesity, Class III, BMI 40-49.9 (morbid obesity) ([place]) 05/10/2011   BLINDNESS, LEGAL, [place] DEFINITION 09/14/2006   Seizure ([place]) 09/13/2006    Past Surgical History:  Procedure Laterality Date   BRAIN HEMATOMA EVACUATION     five procedures total, first procedure when 40 months old, last at 56 years of age.   CYSTOSCOPY W/ RETROGRADES  09/24/2011   Procedure: CYSTOSCOPY WITH RETROGRADE PYELOGRAM;  Surgeon: Malka So, MD;  Location: [facility];  Service: Urology;  Laterality: Right;   CYSTOSCOPY WITH URETEROSCOPY  09/24/2011   Procedure: CYSTOSCOPY WITH URETEROSCOPY;  Surgeon: Malka So, MD;  Location:  [facility];  Service: Urology;  Laterality: Right;  Balloon dilation right ureter    CYSTOSCOPY/RETROGRADE/URETEROSCOPY  08/24/2011   Procedure: CYSTOSCOPY/RETROGRADE/URETEROSCOPY;  Surgeon: Molli Hazard, MD;  Location: [facility];  Service: Urology;  Laterality: Right;  Cysto, Right retrograde Pyelogram, right stent placement.    INCISIONAL HERNIA REPAIR  05/05/2011   Procedure: LAPAROSCOPIC INCISIONAL HERNIA;  Surgeon: Rolm Bookbinder, MD;  Location: [place];  Service: General;  Laterality: N/A;   kidney stone removal     LAPAROTOMY N/A 06/12/2021   Procedure: EXPLORATORY LAPAROTOMY, LYSIS OF ADHESIONS;  Surgeon: Olean Ree, MD;  Location: [facility];  Service: General;  Laterality: N/A;   MASS EXCISION  05/05/2011   Procedure: EXCISION MASS;  Surgeon: Rolm Bookbinder, MD;  Location: [place];  Service: General;  Laterality: Right;   [facility]     RETINAL DETACHMENT SURGERY Left 1990   RIGHT/LEFT HEART CATH AND CORONARY ANGIOGRAPHY N/A 04/28/2019   Procedure: RIGHT/LEFT HEART CATH AND CORONARY ANGIOGRAPHY;  Surgeon: Yolonda Kida, MD;  Location: [facility];  Service: Cardiovascular;  Laterality: N/A;   SHUNT REMOVAL     shunt inserted at age 76 removed at age 14    [place]    Family History  Problem Relation Age of Onset   Uterine cancer Mother    Hypertension Mother    Cancer Mother    Brain cancer Maternal Grandmother    Hypertension Maternal Grandmother    Birth defects Daughter    Hypertension Daughter    Cirrhosis Maternal Grandfather    ADD / ADHD Maternal Grandfather    Birth defects Maternal Grandfather    Diabetes Maternal Grandfather    Anesthesia problems Neg Hx    Colon cancer Neg Hx    Esophageal cancer Neg Hx    Pancreatic cancer Neg Hx     Social History   Tobacco Use   Smoking status: Former    Packs/day: 1.00    Years: 15.00    Total pack years: 15.00    Types: Cigarettes    Quit date: 01/20/1987    Years since quitting: 34.8    Smokeless tobacco: Never   Tobacco comments:    quit more than 20 years  Vaping Use   Vaping Use: Never used  Substance Use Topics   Alcohol use: Yes    Alcohol/week: 1.0 standard drink of alcohol    Types: 1 Glasses of wine per week   Drug use: No    Comment: hx of marijuana use no longer uses      Allergies  Allergen Reactions   Codeine Swelling and Other (See Comments)    Swelling and burning of mouth (inside)   Lactose Intolerance (Gi) Nausea And Vomiting    Health Maintenance  Topic Date Due   Hepatitis C Screening  Never done   Zoster Vaccines- Shingrix (1 of 2) Never done   MAMMOGRAM  08/12/2017   FOOT EXAM  09/22/2019   PAP-Cervical Cytology Screening  06/23/2021   PAP SMEAR-Modifier  06/23/2021   Diabetic kidney evaluation - Urine ACR  10/03/2021   Fecal DNA (Cologuard)  11/17/2021 (Originally 09/21/2009)   COVID-19 Vaccine (4 - Pfizer risk series) 12/03/2021 (Originally 06/20/2021)   Medicare Annual Wellness (AWV)  03/21/2022   HEMOGLOBIN A1C  04/15/2022   OPHTHALMOLOGY EXAM  09/18/2022   Diabetic kidney evaluation - GFR measurement  10/21/2022   TETANUS/TDAP  06/22/2028   INFLUENZA VACCINE  Completed   HIV Screening  Completed   HPV VACCINES  Aged Out   COLONOSCOPY (Pts 45-12yr Insurance coverage will need to be confirmed)  Discontinued    Chart Review Today: I have reviewed the patient's medical history in  detail and updated the computerized patient record. I   Review of Systems  Constitutional: Negative.   HENT: Negative.    Eyes: Negative.   Respiratory: Negative.    Cardiovascular: Negative.   Gastrointestinal: Negative.   Endocrine: Negative.   Genitourinary: Negative.   Musculoskeletal: Negative.   Skin: Negative.   Allergic/Immunologic: Negative.   Neurological: Negative.   Hematological: Negative.   Psychiatric/Behavioral: Negative.    All other systems reviewed and are negative.    Objective:   Vitals:   11/17/21 1051  BP: 132/80   Pulse: 84  Resp: 16  Temp: 98.3 F (36.8 C)  TempSrc: Oral  SpO2: 94%  Weight: 230 lb 9.6 oz (104.6 kg)  Height: 5' 6.5" (1.689 m)    Body mass index is 36.66 kg/m.  Physical Exam Vitals and nursing note reviewed.  Constitutional:      General: She is not in acute distress.    Appearance: She is obese. She is not ill-appearing, toxic-appearing or diaphoretic.  HENT:     Right Ear: External ear normal.     Left Ear: External ear normal.     Mouth/Throat:     Mouth: Mucous membranes are moist.     Pharynx: Oropharynx is clear. No oropharyngeal exudate or posterior oropharyngeal erythema.  Eyes:     General: No scleral icterus.       Right eye: No discharge.        Left eye: No discharge.     Conjunctiva/sclera: Conjunctivae normal.  Cardiovascular:     Rate and Rhythm: Normal rate and regular rhythm.     Pulses: Normal pulses.     Heart sounds: Normal heart sounds.  Pulmonary:     Effort: Pulmonary effort is normal.     Breath sounds: Normal breath sounds.  Skin:    Coloration: Skin is not jaundiced or pale.     Findings: No bruising.  Neurological:     Mental Status: She is alert. Mental status is at baseline.     Gait: Gait abnormal.  Psychiatric:        Mood and Affect: Mood normal.        Behavior: Behavior normal.         Assessment & Plan:     ICD-10-CM   1. Encounter for examination following treatment at hospital  Z09 CBC with Differential/Platelet    COMPLETE METABOLIC PANEL WITH GFR    Microalbumin / creatinine urine ratio    VITAMIN D 25 Hydroxy (Vit-D Deficiency, Fractures)   hospital records and labs reviewed thoroughly today, rehab facility no records available or with pt    2. Essential hypertension, benign  I10    BP adequately controlled today with multiple med changes since admission    3. AKI (acute kidney injury) ([place])  Z76.7 COMPLETE METABOLIC PANEL WITH GFR   recheck labs, per hospital labs renal function improved to near her  baseline - labs today with urine microalb/cr    4. Uncontrolled type 2 diabetes mellitus with hyperglycemia (HCC)  E11.65 blood glucose meter kit and supplies KIT    COMPLETE METABOLIC PANEL WITH GFR    Microalbumin / creatinine urine ratio    Glucose Blood (BLOOD GLUCOSE TEST STRIPS) STRP    Lancets MISC   uncontrolled, poor compliance, multiple barriers - including transportation, [facility] RN or CCM RN/pharmacy consult would be helpful     5. Insulin dependent type 2 diabetes mellitus (HCC)  E11.9 blood glucose meter kit and supplies KIT  F29.2 COMPLETE METABOLIC PANEL WITH GFR    Microalbumin / creatinine urine ratio    Glucose Blood (BLOOD GLUCOSE TEST STRIPS) STRP    Lancets MISC   see above    6. Hypocalcemia  K46.28 COMPLETE METABOLIC PANEL WITH GFR    VITAMIN D 25 Hydroxy (Vit-D Deficiency, Fractures)   rechecking labs        Hospital f/up:  interval hx and plan in bold: Assessment and Plan:   Acute kidney injury - unable to see rehab labs, repeat labs today, will see if pt need nephro referral Prior GFR baseline was >60, last in hospital was 52-53 -Secondary to poor p.o. intake, diarrhea; creatinine peaked at 2.93 -Patient is also on Lasix 20 mg daily along with HCTZ 50 mg daily; both medications currently on hold -Renal function is improving, today creatinine is down to 1.21, likely at baseline -Patient is eating and drinking okay, IV fluids are on hold -CT renal stone study was negative for hydronephrosis, hydroureter; only showed small nonobstructing renal stones bilaterally     Syncope /fall - no problems since d/c to home, [facility] is seeing pt -Patient fell likely from syncope in the setting of dehydration as above -Orthostatic vital signs were negative -Continue monitoring on telemetry -PT evaluation obtained; plan for skilled nursing facility for rehab   History of seizure - per neuro -Patient is on Keppra at home -There was concern for seizure, EEG obtained-result is  negative for epileptiform discharges -Continue Keppra   History of stroke - no change -Continue Plavix   Abdominal distention/GERD - she has f/up with GI and procedure -Improved with Protonix, simethicone -Continue Protonix 40 mg p.o. daily   Diabetes mellitus type 2 - uncontrolled, multiple complications - encouraged pt to focus on getting endo f/up visit For now basal insulin get up to the BID dosing once she has glucometer, then add on SSI per instructions - all reviewed with pt during visit, I explained that if she can work on checking sugars at home and get some readings I can help adjust insulin while she is working on getting appt and transportation to endo in [place] CCM consult would be helpful - nurse for education, pharmacists for assisting with dose adjustments, SW to help with transportation and resources  DM - per endo, last OV June 2023 reviewed today, On trulicity but may be out of meds/refills, farxiga was previously stopped due to recurrent UTI's, insulin pt not very articulate about how much she is giving herself, I have doubts that she is doing even a basal and SSI dose daily - very hesitant to say what doses she is doing and when Trulicity refilled today, farxiga will not restart with hx, she has rx with refills for novolog and lantus Recent pertinent labs: Lab Results  Component Value Date   HGBA1C 13.7 (H) 10/15/2021   HGBA1C 8 07/26/2021   HGBA1C 7.5 (H) 04/26/2021   Lab Results  Component Value Date   MICROALBUR 27.6 (H) 10/03/2020   LDLCALC 67 06/02/2021   CREATININE 1.21 (H) 10/20/2021   Standard of care and health maintenance: Urine Microalbumin:  ordered today Foot exam:  due DM eye exam:  utd ACEI/ARB:  yes - resumed olmesartan upon d/c from hospital Statin:  yes - crestor    -CBG is elevated; patient says her usual blood sugar runs around 200-300 -She becomes symptomatic with hypoglycemia if blood sugar less than 200 -Started on resistant  sliding scale with NovoLog -CBG is now between 200  and 300s; which is patient's baseline -Continue sliding scale insulin with NovoLog -Continue Lantus 20 subcu twice daily -Discontinue insulin pump     Hypertension/CHF: BP Readings from Last 3 Encounters:  11/17/21 132/80  10/21/21 127/89  08/14/21 128/82  Currently taking coreg 25 mg bid, olmesartan 40 mg, lasix 20 mg, clonidine prn  Rechecking renal function, BP adequate today, no near syncope, pt appears euvolemic -Coreg, Benicar HCT,  were held due to hypotension -Blood pressure is now elevated -We will slowly restart home medications, will start with Coreg 25 mg p.o. twice daily -Continue hydralazine 10 mg IV 6 hours as needed for BP greater than 170 -We will start low-dose Lasix 20 mg daily which patient was taking at home -We will discontinue hydrochlorothiazide, hydralazine -Continue Coreg, olmesartan and Lasix   Hyperlipidemia -Restart Crestor, Zetia   History of asthma -Continue albuterol nebulizer as needed -Continue Wixela inhaler twice a day  Return for 2 week telephone insulin/glucometer f/up .   Delsa Grana, [name] 11/17/21 11:15 AM

## 2021-11-17 NOTE — Patient Instructions (Signed)
Call and get your endocrinologist appointment They need to know you're off the insulin pump and dexcom and that your A1C was 14+ and sugars are200-400+

## 2021-11-18 LAB — VITAMIN D 25 HYDROXY (VIT D DEFICIENCY, FRACTURES): Vit D, 25-Hydroxy: 33 ng/mL (ref 30–100)

## 2021-11-18 LAB — COMPLETE METABOLIC PANEL WITH GFR
AG Ratio: 1.4 (calc) (ref 1.0–2.5)
ALT: 10 U/L (ref 6–29)
AST: 10 U/L (ref 10–35)
Albumin: 4.1 g/dL (ref 3.6–5.1)
Alkaline phosphatase (APISO): 74 U/L (ref 37–153)
BUN: 11 mg/dL (ref 7–25)
CO2: 25 mmol/L (ref 20–32)
Calcium: 9.2 mg/dL (ref 8.6–10.4)
Chloride: 100 mmol/L (ref 98–110)
Creat: 1.02 mg/dL (ref 0.50–1.03)
Globulin: 3 g/dL (calc) (ref 1.9–3.7)
Glucose, Bld: 351 mg/dL — ABNORMAL HIGH (ref 65–99)
Potassium: 4.2 mmol/L (ref 3.5–5.3)
Sodium: 138 mmol/L (ref 135–146)
Total Bilirubin: 0.4 mg/dL (ref 0.2–1.2)
Total Protein: 7.1 g/dL (ref 6.1–8.1)
eGFR: 64 mL/min/{1.73_m2} (ref >=60)

## 2021-11-18 LAB — CBC WITH DIFFERENTIAL/PLATELET
Absolute Monocytes: 319 cells/uL (ref 200–950)
Basophils Absolute: 20 cells/uL (ref 0–200)
Basophils Relative: 0.4 %
Eosinophils Absolute: 10 cells/uL — ABNORMAL LOW (ref 15–500)
Eosinophils Relative: 0.2 %
HCT: 39.2 % (ref 35.0–45.0)
Hemoglobin: 12.6 g/dL (ref 11.7–15.5)
Lymphs Abs: 1235 cells/uL (ref 850–3900)
MCH: 28.8 pg (ref 27.0–33.0)
MCHC: 32.1 g/dL (ref 32.0–36.0)
MCV: 89.5 fL (ref 80.0–100.0)
MPV: 11.2 fL (ref 7.5–12.5)
Monocytes Relative: 6.5 %
Neutro Abs: 3317 cells/uL (ref 1500–7800)
Neutrophils Relative %: 67.7 %
Platelets: 282 10*3/uL (ref 140–400)
RBC: 4.38 10*6/uL (ref 3.80–5.10)
RDW: 13.7 % (ref 11.0–15.0)
Total Lymphocyte: 25.2 %
WBC: 4.9 10*3/uL (ref 3.8–10.8)

## 2021-11-18 LAB — MICROALBUMIN / CREATININE URINE RATIO
Creatinine, Urine: 25 mg/dL (ref 20–275)
Microalb Creat Ratio: 36 mcg/mg creat — ABNORMAL HIGH (ref <30)
Microalb, Ur: 0.9 mg/dL

## 2021-11-24 NOTE — Telephone Encounter (Unsigned)
Copied from [employer] [id number]. Topic: General - Inquiry >> Nov 24, 2021  9:45 AM [name] wrote: Reason for CRM: Pt called talking about a letter that had mentioned a "payee'.  She said she is working with [employer] and does not need a payee any longer.  She needs a letter faxed to them saying she does not need a payee.  She is capable of taking care of her finances.  FAX # [phone]

## 2021-11-25 ENCOUNTER — Other Ambulatory Visit: Payer: Self-pay | Admitting: Endocrinology

## 2021-11-25 ENCOUNTER — Other Ambulatory Visit: Payer: Self-pay | Admitting: Student in an Organized Health Care Education/Training Program

## 2021-11-25 DIAGNOSIS — G890 Central pain syndrome: Secondary | ICD-10-CM

## 2021-11-25 DIAGNOSIS — E1142 Type 2 diabetes mellitus with diabetic polyneuropathy: Secondary | ICD-10-CM

## 2021-11-25 DIAGNOSIS — E1165 Type 2 diabetes mellitus with hyperglycemia: Secondary | ICD-10-CM

## 2021-11-25 DIAGNOSIS — G894 Chronic pain syndrome: Secondary | ICD-10-CM

## 2021-11-26 NOTE — Telephone Encounter (Signed)
Patient reports that she is no longer on the insulin pump.  And does not want to go back to a pump.  Reports she was on the pump and her dexcom read 168, but she had passed out, and when the paramedics came,  her blood sugar was 43, and dexcom read 146.   She reports leaving the rehab center and is at home and currently on Lantus 20u once daily and Novolog via sliding scale 0-20u ac meals.  She reports blood sugars "high all the time".  I asked her to check the discharge papers because script is written for lantus 20u BID.   She reports that it is written that she takes Lantus 20u twice daily.  She has not been doing that for the last week.  She will start twice daily today.

## 2021-11-27 ENCOUNTER — Other Ambulatory Visit: Payer: Self-pay

## 2021-11-27 DIAGNOSIS — E1165 Type 2 diabetes mellitus with hyperglycemia: Secondary | ICD-10-CM

## 2021-11-27 MED ORDER — INSULIN LISPRO 100 UNIT/ML IJ SOLN: INTRAMUSCULAR | 3 refills | Status: DC

## 2021-11-27 MED ORDER — INSULIN GLARGINE 100 UNIT/ML SOLOSTAR PEN: 20.0000 [IU] | PEN_INJECTOR | Freq: Two times a day (BID) | SUBCUTANEOUS | 11 refills | Status: DC

## 2021-11-27 MED ORDER — INSULIN LISPRO 100 UNIT/ML IJ SOLN: INTRAMUSCULAR | Status: DC

## 2021-11-28 ENCOUNTER — Telehealth: Payer: Self-pay

## 2021-11-28 NOTE — Telephone Encounter (Signed)
Patient states she needs PA for lantus

## 2021-12-01 ENCOUNTER — Telehealth (INDEPENDENT_AMBULATORY_CARE_PROVIDER_SITE_OTHER): Payer: Medicare Other | Admitting: Family Medicine

## 2021-12-01 ENCOUNTER — Other Ambulatory Visit (HOSPITAL_COMMUNITY): Payer: Self-pay

## 2021-12-01 DIAGNOSIS — E119 Type 2 diabetes mellitus without complications: Secondary | ICD-10-CM

## 2021-12-01 DIAGNOSIS — E1165 Type 2 diabetes mellitus with hyperglycemia: Secondary | ICD-10-CM

## 2021-12-01 DIAGNOSIS — J069 Acute upper respiratory infection, unspecified: Secondary | ICD-10-CM

## 2021-12-01 DIAGNOSIS — R059 Cough, unspecified: Secondary | ICD-10-CM

## 2021-12-01 DIAGNOSIS — Z794 Long term (current) use of insulin: Secondary | ICD-10-CM

## 2021-12-01 MED ORDER — TRULICITY 4.5 MG/0.5ML ~~LOC~~ SOAJ: 4.5000 mg | SUBCUTANEOUS | 1 refills | Status: DC

## 2021-12-01 MED ORDER — INSULIN GLARGINE 100 UNIT/ML SOLOSTAR PEN: 20.0000 [IU] | PEN_INJECTOR | Freq: Two times a day (BID) | SUBCUTANEOUS | 1 refills | Status: DC

## 2021-12-01 MED ORDER — COVID-19 AT HOME ANTIGEN TEST VI KIT: PACK | 1 refills | Status: DC

## 2021-12-01 NOTE — Progress Notes (Signed)
Name: [name]   MRN: [id number]    DOB: 09/16/1965   Date:12/01/2021       Progress Note  Subjective:    I connected with  [name]  on 12/01/21 at 10:40 AM EST by a video enabled telemedicine application and verified that I am speaking with the correct person using two identifiers.  I discussed the limitations of evaluation and management by telemedicine and the availability of in person appointments. The patient expressed understanding and agreed to proceed. Staff also discussed with the patient that there may be a patient responsible charge related to this service. Patient Location: home Provider Location: [facility] clinic  Additional Individuals present: none  Chief Complaint  Patient presents with   Follow-up   Diabetes    Pt states blood sugars have been up and down.    [name] is a 56 y.o. female, presents for virtual visit for routine follow up on the conditions listed above.  Here for diabetes f/up  She had multiple changes with last hospitalization -  Sugar this am was 388  9am, fasting was 345 this am She is doing 20 units lantus BID and mealtimes sliding scale  Last OV (about 2 weeks ago) she was only doing one basal dose daily and maybe only one mealtime dose - she didn't have a meter She now has a meter and states she is doing basal insulin BID and mealtime 3x a day Sugars still over 300 She has an appt with endocrinology in about 3 weeks   Diabetes mellitus type 2 - uncontrolled, multiple complications - encouraged pt to focus on getting endo f/up visit For now basal insulin get up to the BID dosing once she has glucometer, then add on SSI per instructions - all reviewed with pt during visit, I explained that if she can work on checking sugars at home and get some readings I can help adjust insulin while she is working on getting appt and transportation to endo in [place] consult would be helpful - nurse for education,  pharmacists for assisting with dose adjustments, SW to help with transportation and resources   DM - per endo, last OV June 2023 reviewed today, On trulicity but may be out of meds/refills, farxiga was previously stopped due to recurrent UTI's, insulin pt not very articulate about how much she is giving herself, I have doubts that she is doing even a basal and SSI dose daily - very hesitant to say what doses she is doing and when Trulicity refilled today, farxiga will not restart with hx, she has rx with refills for novolog and lantus Recent pertinent labs: Recent Labs       Lab Results  Component Value Date    HGBA1C 13.7 (H) 10/15/2021    HGBA1C 8 07/26/2021    HGBA1C 7.5 (H) 04/26/2021      Recent Labs       Lab Results  Component Value Date    MICROALBUR 27.6 (H) 10/03/2020    [place] 67 06/02/2021    CREATININE 1.21 (H) 10/20/2021      Standard of care and health maintenance: Urine Microalbumin:  ordered today Foot exam:  due DM eye exam:  utd ACEI/ARB:  yes - resumed olmesartan upon d/c from hospital Statin:  yes - crestor -CBG is elevated; patient says her usual blood sugar runs around 200-300 -She becomes symptomatic with hypoglycemia if blood sugar less than 200 -Started on resistant sliding scale with NovoLog -CBG  is now between 200 and 300s; which is patient's baseline -Continue sliding scale insulin with NovoLog -Continue Lantus 20 subcu twice daily -Discontinue insulin pump    Patient Active Problem List   Diagnosis Date Noted   Uncontrolled type 2 diabetes mellitus with hyperglycemia ([place]) 11/17/2021   Insulin dependent type 2 diabetes mellitus ([place]) 11/17/2021   Acute kidney injury ([place]) 10/15/2021   Mild episode of recurrent major depressive disorder ([place]) 08/14/2021   Hypoglycemia 07/07/2021   UTI (urinary tract infection) 07/07/2021   Syncope 07/07/2021   Diarrhea 06/09/2021   Hyperkalemia 06/09/2021   Intractable nausea and vomiting 06/08/2021    Small bowel obstruction ([place]) 05/18/2021   Orthostatic hypotension 04/29/2021   Constipation 04/28/2021   SBO (small bowel obstruction) ([place]) 04/23/2021   Hypertensive urgency 04/23/2021   GERD without esophagitis 04/23/2021   AKI (acute kidney injury) ([place]) 04/23/2021   Dyslipidemia 04/23/2021   Chronic diastolic CHF (congestive heart failure) ([place]) 04/23/2021   Cervical spondylosis 02/12/2021   Lumbar facet arthropathy 02/12/2021   Angina pectoris ([place]) 11/13/2020   Major depressive disorder, recurrent episode, moderate ([place]) 11/13/2020   Adrenal adenoma, right    PAD (peripheral artery disease) ([place]) 01/24/2020   Abnormal ankle brachial index (ABI) 12/18/2019   CAD (coronary artery disease) 04/27/2019   Diabetic polyneuropathy associated with type 2 diabetes mellitus ([place]) 03/02/2018   Vitamin D deficiency 01/24/2018   Overactive bladder 05/18/2017   Dilated aortic root (HCC)    Sacroiliac dysfunction 10/09/2014   Chronic low back pain 09/03/2014   CVA, old, hemiparesis ([place]) 09/03/2014   OSA (obstructive sleep apnea) 05/15/2014   Dyslipidemia associated with type 2 diabetes mellitus ([place]) 01/21/2014   Essential hypertension, benign 01/21/2014   Chronic pain syndrome 01/21/2014   Headache disorder 09/07/2013   Mixed urge and stress incontinence 08/25/2011   Ureteral stricture, right 08/25/2011   Nephrolithiasis 08/22/2011   Incisional hernia 05/10/2011   Obesity, Class III, BMI 40-49.9 (morbid obesity) ([place]) 05/10/2011   BLINDNESS, LEGAL, [place] DEFINITION 09/14/2006   Seizure ([place]) 09/13/2006    Current Outpatient Medications:    baclofen (LIORESAL) 10 MG tablet, TAKE 1 TABLET BY MOUTH THREE TIMES A DAY AS NEEDED FOR MUSCLE SPASMS, Disp: 270 tablet, Rfl: 1   bisacodyl (DULCOLAX) 5 MG EC tablet, Take 1 tablet (5 mg total) by mouth daily as needed for moderate constipation., Disp: 30 tablet, Rfl: 0   blood glucose meter kit and supplies KIT, Dispense based on patient and  insurance preference. Use up to four times daily as directed. (FOR E11.65 Z79.4)., Disp: 1 each, Rfl: 0   butalbital-acetaminophen-caffeine (FIORICET) 50-325-40 MG tablet, Take 1-2 tablets by mouth every 6 (six) hours as needed for headache., Disp: 10 tablet, Rfl: 0   carvedilol (COREG) 25 MG tablet, Take 25 mg by mouth 2 (two) times daily., Disp: , Rfl:    cholecalciferol (VITAMIN D3) 25 MCG (1000 UNIT) tablet, Take 1,000 Units by mouth daily., Disp: , Rfl:    ciclopirox (PENLAC) 8 % solution, Apply 1 Application topically at bedtime., Disp: , Rfl:    cloNIDine (CATAPRES) 0.1 MG tablet, Take 0.1 mg by mouth daily as needed (For blood pressure overm160/90)., Disp: , Rfl:    clopidogrel (PLAVIX) 75 MG tablet, Take 1 tablet (75 mg total) by mouth daily., Disp: 90 tablet, Rfl: 0   cycloSPORINE (RESTASIS) 0.05 % ophthalmic emulsion, Place 2 drops into both eyes 2 (two) times daily., Disp: , Rfl:    Dulaglutide (TRULICITY) 4.5 IW/5.8KD SOPN, Inject 4.5  mg as directed once a week., Disp: 2 mL, Rfl: 1   DULoxetine (CYMBALTA) 60 MG capsule, Take 1 capsule (60 mg total) by mouth daily., Disp: 90 capsule, Rfl: 3   EPINEPHrine 0.3 mg/0.3 mL IJ SOAJ injection, Inject 0.3 mg into the muscle as needed for anaphylaxis., Disp: , Rfl:    ezetimibe (ZETIA) 10 MG tablet, Take 1 tablet (10 mg total) by mouth daily., Disp: 90 tablet, Rfl: 3   furosemide (LASIX) 20 MG tablet, Take 20 mg by mouth., Disp: , Rfl:    gabapentin (NEURONTIN) 600 MG tablet, 600 mg qAM, 600 mg qPM, 1200 mg QHS (Patient taking differently: Take 600-1,200 mg by mouth See admin instructions. Take 1 tablet by mouth in the morning and then take 2 tablets by mouth at bedtime), Disp: 120 tablet, Rfl: 5   Glucose Blood (BLOOD GLUCOSE TEST STRIPS) STRP, Use as directed to monitor blood sugar up to 4x a day FOR E11.65 Z79.4, Disp: 300 strip, Rfl: 5   GVOKE HYPOPEN 2-PACK 1 MG/0.2ML SOAJ, Inject 1 mg into the skin once as needed for up to 1 dose  (hypoglycemia). Only for severe low blood sugar not responding to drinking juice/glucose tablets, when CBG is <70, Disp: 0.4 mL, Rfl: 0   insulin aspart (NOVOLOG) 100 UNIT/ML injection, Inject 0-20 Units into the skin 3 (three) times daily with meals. Sliding scale insulin Less than 70 initiate hypoglycemia protocol 70-120  0 units 121-150 3 units 151-200 4 units 201-250 7 units 251-300 11 units 301-350 15 units 351-400  20 units Greater than 400 call MD, Disp: 10 mL, Rfl: 11   insulin glargine (LANTUS) 100 UNIT/ML Solostar Pen, Inject 20 Units into the skin 2 (two) times daily., Disp: 15 mL, Rfl: 11   insulin lispro (HUMALOG) 100 UNIT/ML injection, Max daily dose 160 units, Disp: 20 mL, Rfl: 3   Lancets MISC, Use as directed to monitor blood sugar up to 4x a day FOR E11.65 Z79.4, Disp: 200 each, Rfl: 11   levETIRAcetam (KEPPRA) 500 MG tablet, Take 500-750 mg by mouth See admin instructions. Take 1 tablet (585m) by mouth every morning and take 1 tablets by mouth (7542m by mouth every night, Disp: , Rfl:    mirabegron ER (MYRBETRIQ) 50 MG TB24 tablet, Take 1 tablet (50 mg total) by mouth daily., Disp: 90 tablet, Rfl: 1   olmesartan (BENICAR) 40 MG tablet, Take 1 tablet (40 mg total) by mouth daily., Disp: 30 tablet, Rfl: 11   oxyCODONE (OXY IR/ROXICODONE) 5 MG immediate release tablet, Take 1 tablet (5 mg total) by mouth every 6 (six) hours as needed for breakthrough pain or severe pain., Disp: 10 tablet, Rfl: 0   pantoprazole (PROTONIX) 40 MG tablet, Take 1 tablet (40 mg total) by mouth daily., Disp: 30 tablet, Rfl: 3   polyethylene glycol (MIRALAX / GLYCOLAX) 17 g packet, Take 17 g by mouth daily as needed for mild constipation., Disp: 100 each, Rfl: 3   rosuvastatin (CRESTOR) 10 MG tablet, Take 1 tablet (10 mg total) by mouth daily., Disp: 90 tablet, Rfl: 3   senna-docusate (SENOKOT-S) 8.6-50 MG tablet, Take 1 tablet by mouth at bedtime as needed for mild constipation., Disp: 60 tablet, Rfl: 0    sodium phosphate (FLEET) 7-19 GM/118ML ENEM, Place 133 mLs (1 enema total) rectally daily as needed for severe constipation., Disp: 133 mL, Rfl: 2   WIXELA INHUB 250-50 MCG/DOSE AEPB, Inhale 1 puff into the lungs 2 (two) times daily., Disp: , Rfl:  amitriptyline (ELAVIL) 25 MG tablet, Take 2-3 tablets (50-75 mg total) by mouth at bedtime. (Patient taking differently: Take 75 mg by mouth at bedtime.), Disp: 90 tablet, Rfl: 5 Allergies  Allergen Reactions   Codeine Swelling and Other (See Comments)    Swelling and burning of mouth (inside)   [place] [Dapagliflozin] Other (See Comments)    Recurrent UTI with farxiga   Lactose Intolerance (Gi) Nausea And Vomiting    Past Surgical History:  Procedure Laterality Date   BRAIN HEMATOMA EVACUATION     five procedures total, first procedure when 27 months old, last at 56 years of age.   CYSTOSCOPY W/ RETROGRADES  09/24/2011   Procedure: CYSTOSCOPY WITH RETROGRADE PYELOGRAM;  Surgeon: Malka So, MD;  Location: [facility];  Service: Urology;  Laterality: Right;   CYSTOSCOPY WITH URETEROSCOPY  09/24/2011   Procedure: CYSTOSCOPY WITH URETEROSCOPY;  Surgeon: Malka So, MD;  Location: [facility];  Service: Urology;  Laterality: Right;  Balloon dilation right ureter    CYSTOSCOPY/RETROGRADE/URETEROSCOPY  08/24/2011   Procedure: CYSTOSCOPY/RETROGRADE/URETEROSCOPY;  Surgeon: Molli Hazard, MD;  Location: [facility];  Service: Urology;  Laterality: Right;  Cysto, Right retrograde Pyelogram, right stent placement.    INCISIONAL HERNIA REPAIR  05/05/2011   Procedure: LAPAROSCOPIC INCISIONAL HERNIA;  Surgeon: Rolm Bookbinder, MD;  Location: [place];  Service: General;  Laterality: N/A;   kidney stone removal     LAPAROTOMY N/A 06/12/2021   Procedure: EXPLORATORY LAPAROTOMY, LYSIS OF ADHESIONS;  Surgeon: Olean Ree, MD;  Location: [facility];  Service: General;  Laterality: N/A;   MASS EXCISION  05/05/2011   Procedure: EXCISION MASS;  Surgeon: Rolm Bookbinder, MD;   Location: [place];  Service: General;  Laterality: Right;   [facility]     RETINAL DETACHMENT SURGERY Left 1990   RIGHT/LEFT HEART CATH AND CORONARY ANGIOGRAPHY N/A 04/28/2019   Procedure: RIGHT/LEFT HEART CATH AND CORONARY ANGIOGRAPHY;  Surgeon: Yolonda Kida, MD;  Location: [facility];  Service: Cardiovascular;  Laterality: N/A;   SHUNT REMOVAL     shunt inserted at age 23 removed at age 79    [place]   Family History  Problem Relation Age of Onset   Uterine cancer Mother    Hypertension Mother    Cancer Mother    Brain cancer Maternal Grandmother    Hypertension Maternal Grandmother    Birth defects Daughter    Hypertension Daughter    Cirrhosis Maternal Grandfather    ADD / ADHD Maternal Grandfather    Birth defects Maternal Grandfather    Diabetes Maternal Grandfather    Anesthesia problems Neg Hx    Colon cancer Neg Hx    Esophageal cancer Neg Hx    Pancreatic cancer Neg Hx    Social History   Socioeconomic History   Marital status: Widowed    Spouse name: Not on file   Number of children: 2   Years of education: Not on file   Highest education level: Not on file  Occupational History   Occupation: disabled  Tobacco Use   Smoking status: Former    Packs/day: 1.00    Years: 15.00    Total pack years: 15.00    Types: Cigarettes    Quit date: 01/20/1987    Years since quitting: 34.8   Smokeless tobacco: Never   Tobacco comments:    quit more than 20 years  Vaping Use   Vaping Use: Never used  Substance and Sexual Activity  Alcohol use: Yes    Alcohol/week: 1.0 standard drink of alcohol    Types: 1 Glasses of wine per week   Drug use: No    Comment: hx of marijuana use no longer uses    Sexual activity: Yes    Partners: Male    Birth control/protection: Surgical  Other Topics Concern   Not on file  Social History Narrative   Pt lives with her daughter   Social Determinants of Health   Financial Resource Strain: Low Risk   (03/20/2021)   Overall Financial Resource Strain (CARDIA)    Difficulty of Paying Living Expenses: Not very hard  Food Insecurity: No Food Insecurity (03/20/2021)   Hunger Vital Sign    Worried About Running Out of Food in the Last Year: Never true    Ran Out of Food in the Last Year: Never true  Transportation Needs: No Transportation Needs (03/20/2021)   PRAPARE - Hydrologist (Medical): No    Lack of Transportation (Non-Medical): No  Physical Activity: Insufficiently Active (03/20/2021)   Exercise Vital Sign    Days of Exercise per Week: 2 days    Minutes of Exercise per Session: 10 min  Stress: No Stress Concern Present (03/20/2021)   [place]    Feeling of Stress : Not at all  Social Connections: Socially Isolated (03/20/2021)   Social Connection and Isolation Panel [NHANES]    Frequency of Communication with Friends and Family: More than three times a week    Frequency of Social Gatherings with Friends and Family: More than three times a week    Attends Religious Services: Never    Marine scientist or Organizations: No    Attends Archivist Meetings: Never    Marital Status: Widowed  Intimate Partner Violence: Not At Risk (03/20/2021)   Humiliation, Afraid, Rape, and Kick questionnaire    Fear of Current or Ex-Partner: No    Emotionally Abused: No    Physically Abused: No    Sexually Abused: No    Chart Review Today: I personally reviewed active problem list, medication list, allergies, family history, social history, health maintenance, notes from last encounter, lab results, imaging with the patient/caregiver today.   Review of Systems  Constitutional: Negative.   HENT: Negative.    Eyes: Negative.   Respiratory: Negative.    Cardiovascular: Negative.   Gastrointestinal: Negative.   Endocrine: Negative.   Genitourinary: Negative.   Musculoskeletal: Negative.    Skin: Negative.   Allergic/Immunologic: Negative.   Neurological: Negative.   Hematological: Negative.   Psychiatric/Behavioral: Negative.    All other systems reviewed and are negative.     Objective:    Virtual encounter, vitals limited, only able to obtain the following There were no vitals filed for this visit. There is no height or weight on file to calculate BMI. Nursing Note and Vital Signs reviewed.  Physical Exam Vitals reviewed.  Neck:     Trachea: Phonation normal.  Neurological:     Mental Status: She is alert.     PE limited by telephone encounter  No results found. However, due to the size of the patient record, not all encounters were searched. Please check Results Review for a complete set of results.  PHQ2/9:    12/01/2021   10:04 AM 11/17/2021   10:50 AM 08/14/2021   11:13 AM 07/01/2021   10:09 AM 06/23/2021    1:44 PM  Depression screen PHQ 2/9  Decreased Interest 0 0 2 0 0  Down, Depressed, Hopeless 0 0 1 0 0  PHQ - 2 Score 0 0 3 0 0  Altered sleeping 0 0 2 0 0  Tired, decreased energy 0 0 1 0 0  Change in appetite 0 0 2 0 0  Feeling bad or failure about yourself  0 0 1 0 0  Trouble concentrating 0 0 0 0 0  Moving slowly or fidgety/restless 0 0 2 0 0  Suicidal thoughts 0 0 0 0 0  PHQ-9 Score 0 0 11 0 0  Difficult doing work/chores Not difficult at all Not difficult at all Somewhat difficult Not difficult at all Not difficult at all   PHQ-2/9 Result is neg, reviewed today  Fall Risk:    12/01/2021   10:04 AM 11/17/2021   10:49 AM 08/14/2021   11:13 AM 07/01/2021   10:09 AM 06/23/2021    1:43 PM  Fall Risk   Falls in the past year? _0 Number falls in past yr: _1 Injury with Fall? 1 1 0 1 0  Risk for fall due to : Impaired balance/gait;History of fall(s) History of fall(s);Impaired balance/gait;Impaired mobility History of fall(s) Impaired balance/gait;Impaired mobility Impaired balance/gait  Follow up Falls prevention  discussed;Education provided;Falls evaluation completed Falls prevention discussed;Education provided;Falls evaluation completed Falls evaluation completed Education provided;Falls prevention discussed Education provided;Falls prevention discussed     Assessment and Plan:     ICD-10-CM   1. Uncontrolled type 2 diabetes mellitus with hyperglycemia (HCC)  E11.65 Dulaglutide (TRULICITY) 4.5 MV/3.6PQ SOPN    insulin glargine (LANTUS) 100 UNIT/ML Solostar Pen   pt instructed to increase basal insulin dose to 30 BID and then increase doses by 2-5 units every 3 d if fasting sugars still over 200, f/up endocrinology    2. Insulin dependent type 2 diabetes mellitus (HCC)  E11.9 Dulaglutide (TRULICITY) 4.5 AE/4.9PN SOPN   Z79.4 insulin glargine (LANTUS) 100 UNIT/ML Solostar Pen   sugars still over 300, titrating basal and mealtime insulin    3. Upper respiratory tract infection, unspecified type  J06.9 COVID-19 At Home Antigen Test KIT   nasal congestion and drainage started last night, can do her nasal sprays, add antihistamines    4. Cough, unspecified type  R05.9 COVID-19 At Home Antigen Test KIT   can do home covid test, mucinex, push fluids, control allergies with antihistamines and nasal sprays, no fever, no CP or SOB      I discussed the assessment and treatment plan with the patient. The patient was provided an opportunity to ask questions and all were answered. The patient agreed with the plan and demonstrated an understanding of the instructions.  The patient was advised to call back or seek an in-person evaluation if the symptoms worsen or if the condition fails to improve as anticipated.  I provided 25 minutes of non-face-to-face time during this encounter.  Delsa Grana, [name] 12/01/21 11:16 AM

## 2021-12-03 NOTE — Telephone Encounter (Unsigned)
Copied from [place] [id number]. Topic: Quick Communication - Home Health Verbal Orders >> Nov 20, 2021  4:36 PM [place] C wrote: Caller/Agency: Sherlynn Stalls / Jobie Quaker Number: [phone]  Requesting OT/PT/Skilled Nursing/Social Work/Speech Therapy: OT  evaluation complete and the patient does not need services >> Nov 20, 2021  4:48 PM [place] R wrote: Not a patient at [facility].

## 2021-12-15 ENCOUNTER — Telehealth (INDEPENDENT_AMBULATORY_CARE_PROVIDER_SITE_OTHER): Payer: Medicare Other | Admitting: Family Medicine

## 2021-12-15 DIAGNOSIS — Z794 Long term (current) use of insulin: Secondary | ICD-10-CM

## 2021-12-15 DIAGNOSIS — Z1159 Encounter for screening for other viral diseases: Secondary | ICD-10-CM

## 2021-12-15 DIAGNOSIS — Z1231 Encounter for screening mammogram for malignant neoplasm of breast: Secondary | ICD-10-CM

## 2021-12-15 DIAGNOSIS — E1165 Type 2 diabetes mellitus with hyperglycemia: Secondary | ICD-10-CM | POA: Diagnosis not present

## 2021-12-15 DIAGNOSIS — Z5181 Encounter for therapeutic drug level monitoring: Secondary | ICD-10-CM | POA: Diagnosis not present

## 2021-12-15 DIAGNOSIS — Z1211 Encounter for screening for malignant neoplasm of colon: Secondary | ICD-10-CM

## 2021-12-15 DIAGNOSIS — E119 Type 2 diabetes mellitus without complications: Secondary | ICD-10-CM

## 2021-12-15 MED ORDER — INSULIN GLARGINE 100 UNIT/ML SOLOSTAR PEN: 30.0000 [IU] | PEN_INJECTOR | Freq: Two times a day (BID) | SUBCUTANEOUS | 1 refills | Status: DC

## 2021-12-15 NOTE — Progress Notes (Signed)
Name: [name]   MRN: [id number]    DOB: 02-10-1965   Date:12/15/2021       Progress Note  Subjective:    Chief Complaint  Chief Complaint  Patient presents with   Follow-up   Diabetes    Sugars still running high    I connected with  [name] on 12/15/21 at  2:00 PM EST by telephone and verified that I am speaking with the correct person using two identifiers.   I discussed the limitations, risks, security and privacy concerns of performing an evaluation and management service by telephone and the availability of in person appointments. Staff also discussed with the patient that there may be a patient responsible charge related to this service.  Patient verbalized understanding and agreed to proceed with encounter. Patient Location: home Provider Location: [facility] clinic Additional Individuals present: none  HPI  F/up on blood sugars - uncontrolled after d/c of insulin pump - est with endo but difficulty with transportation has limited her ability to f/up with them  Fasting blood sugar this am 256, pt is currently doing lantus 30 units BID, and still doing the mealtime insulin sliding scale per last Rx/hospitalization (Sliding scale insulin Less than 70 initiate hypoglycemia protocol 70-120 0 units 121-150 3 units 151-200 4 units 201-250 7 units 251-300 11 units 301-350 15 units 351-400 20 units Greater than 400 call MD ) Lowest sugar she has seen in several weeks is 200 She has f/up with her endocrinologist on 12/7/22023 She is still doing sliding scale novolog, trulicity, checking her sugars manually with glucometer        Patient Active Problem List   Diagnosis Date Noted   Uncontrolled type 2 diabetes mellitus with hyperglycemia ([place]) 11/17/2021   Insulin dependent type 2 diabetes mellitus ([place]) 11/17/2021   Acute kidney injury ([place]) 10/15/2021   Mild episode of recurrent major depressive disorder ([place]) 08/14/2021   Hypoglycemia 07/07/2021   UTI  (urinary tract infection) 07/07/2021   Syncope 07/07/2021   Diarrhea 06/09/2021   Hyperkalemia 06/09/2021   Intractable nausea and vomiting 06/08/2021   Small bowel obstruction ([place]) 05/18/2021   Orthostatic hypotension 04/29/2021   Constipation 04/28/2021   SBO (small bowel obstruction) ([place]) 04/23/2021   Hypertensive urgency 04/23/2021   GERD without esophagitis 04/23/2021   AKI (acute kidney injury) ([place]) 04/23/2021   Dyslipidemia 04/23/2021   Chronic diastolic CHF (congestive heart failure) ([place]) 04/23/2021   Cervical spondylosis 02/12/2021   Lumbar facet arthropathy 02/12/2021   Angina pectoris ([place]) 11/13/2020   Major depressive disorder, recurrent episode, moderate ([place]) 11/13/2020   Adrenal adenoma, right    PAD (peripheral artery disease) ([place]) 01/24/2020   Abnormal ankle brachial index (ABI) 12/18/2019   CAD (coronary artery disease) 04/27/2019   Diabetic polyneuropathy associated with type 2 diabetes mellitus ([place]) 03/02/2018   Vitamin D deficiency 01/24/2018   Overactive bladder 05/18/2017   Dilated aortic root ([place])    Sacroiliac dysfunction 10/09/2014   Chronic low back pain 09/03/2014   CVA, old, hemiparesis ([place]) 09/03/2014   OSA (obstructive sleep apnea) 05/15/2014   Dyslipidemia associated with type 2 diabetes mellitus ([place]) 01/21/2014   Essential hypertension, benign 01/21/2014   Chronic pain syndrome 01/21/2014   Headache disorder 09/07/2013   Mixed urge and stress incontinence 08/25/2011   Ureteral stricture, right 08/25/2011   Nephrolithiasis 08/22/2011   Incisional hernia 05/10/2011   Obesity, Class III, BMI 40-49.9 (morbid obesity) ([place]) 05/10/2011   BLINDNESS, [place], [place] DEFINITION 09/14/2006  Seizure ([place]) 09/13/2006    Social History   Tobacco Use   Smoking status: Former    Packs/day: 1.00    Years: 15.00    Total pack years: 15.00    Types: Cigarettes    Quit date: 01/20/1987    Years since quitting: 34.9   Smokeless tobacco: Never    Tobacco comments:    quit more than 20 years  Substance Use Topics   Alcohol use: Yes    Alcohol/week: 1.0 standard drink of alcohol    Types: 1 Glasses of wine per week     Current Outpatient Medications:    baclofen (LIORESAL) 10 MG tablet, TAKE 1 TABLET BY MOUTH THREE TIMES A DAY AS NEEDED FOR MUSCLE SPASMS, Disp: 270 tablet, Rfl: 1   bisacodyl (DULCOLAX) 5 MG EC tablet, Take 1 tablet (5 mg total) by mouth daily as needed for moderate constipation., Disp: 30 tablet, Rfl: 0   blood glucose meter kit and supplies KIT, Dispense based on patient and insurance preference. Use up to four times daily as directed. (FOR E11.65 Z79.4)., Disp: 1 each, Rfl: 0   butalbital-acetaminophen-caffeine (FIORICET) 50-325-40 MG tablet, Take 1-2 tablets by mouth every 6 (six) hours as needed for headache., Disp: 10 tablet, Rfl: 0   carvedilol (COREG) 25 MG tablet, Take 25 mg by mouth 2 (two) times daily., Disp: , Rfl:    cholecalciferol (VITAMIN D3) 25 MCG (1000 UNIT) tablet, Take 1,000 Units by mouth daily., Disp: , Rfl:    ciclopirox (PENLAC) 8 % solution, Apply 1 Application topically at bedtime., Disp: , Rfl:    cloNIDine (CATAPRES) 0.1 MG tablet, Take 0.1 mg by mouth daily as needed (For blood pressure overm160/90)., Disp: , Rfl:    clopidogrel (PLAVIX) 75 MG tablet, Take 1 tablet (75 mg total) by mouth daily., Disp: 90 tablet, Rfl: 0   COVID-19 At Home Antigen Test KIT, Use as directed., Disp: 2 each, Rfl: 1   cycloSPORINE (RESTASIS) 0.05 % ophthalmic emulsion, Place 2 drops into both eyes 2 (two) times daily., Disp: , Rfl:    Dulaglutide (TRULICITY) 4.5 VP/7.1GG SOPN, Inject 4.5 mg as directed once a week., Disp: 6 mL, Rfl: 1   DULoxetine (CYMBALTA) 60 MG capsule, Take 1 capsule (60 mg total) by mouth daily., Disp: 90 capsule, Rfl: 3   EPINEPHrine 0.3 mg/0.3 mL IJ SOAJ injection, Inject 0.3 mg into the muscle as needed for anaphylaxis., Disp: , Rfl:    ezetimibe (ZETIA) 10 MG tablet, Take 1 tablet (10 mg  total) by mouth daily., Disp: 90 tablet, Rfl: 3   furosemide (LASIX) 20 MG tablet, Take 20 mg by mouth., Disp: , Rfl:    gabapentin (NEURONTIN) 600 MG tablet, 600 mg qAM, 600 mg qPM, 1200 mg QHS (Patient taking differently: Take 600-1,200 mg by mouth See admin instructions. Take 1 tablet by mouth in the morning and then take 2 tablets by mouth at bedtime), Disp: 120 tablet, Rfl: 5   Glucose Blood (BLOOD GLUCOSE TEST STRIPS) STRP, Use as directed to monitor blood sugar up to 4x a day FOR E11.65 Z79.4, Disp: 300 strip, Rfl: 5   GVOKE HYPOPEN 2-PACK 1 MG/0.2ML SOAJ, Inject 1 mg into the skin once as needed for up to 1 dose (hypoglycemia). Only for severe low blood sugar not responding to drinking juice/glucose tablets, when CBG is <70, Disp: 0.4 mL, Rfl: 0   insulin aspart (NOVOLOG) 100 UNIT/ML injection, Inject 0-20 Units into the skin 3 (three) times daily with meals. Sliding  scale insulin Less than 70 initiate hypoglycemia protocol 70-120  0 units 121-150 3 units 151-200 4 units 201-250 7 units 251-300 11 units 301-350 15 units 351-400  20 units Greater than 400 call MD, Disp: 10 mL, Rfl: 11   insulin glargine (LANTUS) 100 UNIT/ML Solostar Pen, Inject 20-40 Units into the skin 2 (two) times daily., Disp: 15 mL, Rfl: 1   insulin lispro (HUMALOG) 100 UNIT/ML injection, Max daily dose 160 units, Disp: 20 mL, Rfl: 3   Lancets MISC, Use as directed to monitor blood sugar up to 4x a day FOR E11.65 Z79.4, Disp: 200 each, Rfl: 11   levETIRAcetam (KEPPRA) 500 MG tablet, Take 500-750 mg by mouth See admin instructions. Take 1 tablet (552m) by mouth every morning and take 1 tablets by mouth (7564m by mouth every night, Disp: , Rfl:    mirabegron ER (MYRBETRIQ) 50 MG TB24 tablet, Take 1 tablet (50 mg total) by mouth daily., Disp: 90 tablet, Rfl: 1   olmesartan (BENICAR) 40 MG tablet, Take 1 tablet (40 mg total) by mouth daily., Disp: 30 tablet, Rfl: 11   oxyCODONE (OXY IR/ROXICODONE) 5 MG immediate release  tablet, Take 1 tablet (5 mg total) by mouth every 6 (six) hours as needed for breakthrough pain or severe pain., Disp: 10 tablet, Rfl: 0   pantoprazole (PROTONIX) 40 MG tablet, Take 1 tablet (40 mg total) by mouth daily., Disp: 30 tablet, Rfl: 3   polyethylene glycol (MIRALAX / GLYCOLAX) 17 g packet, Take 17 g by mouth daily as needed for mild constipation., Disp: 100 each, Rfl: 3   rosuvastatin (CRESTOR) 10 MG tablet, Take 1 tablet (10 mg total) by mouth daily., Disp: 90 tablet, Rfl: 3   senna-docusate (SENOKOT-S) 8.6-50 MG tablet, Take 1 tablet by mouth at bedtime as needed for mild constipation., Disp: 60 tablet, Rfl: 0   sodium phosphate (FLEET) 7-19 GM/118ML ENEM, Place 133 mLs (1 enema total) rectally daily as needed for severe constipation., Disp: 133 mL, Rfl: 2   WIXELA INHUB 250-50 MCG/DOSE AEPB, Inhale 1 puff into the lungs 2 (two) times daily., Disp: , Rfl:    amitriptyline (ELAVIL) 25 MG tablet, Take 2-3 tablets (50-75 mg total) by mouth at bedtime. (Patient taking differently: Take 75 mg by mouth at bedtime.), Disp: 90 tablet, Rfl: 5  Allergies  Allergen Reactions   Codeine Swelling and Other (See Comments)    Swelling and burning of mouth (inside)   FaIranDapagliflozin] Other (See Comments)    Recurrent UTI with farxiga   Lactose Intolerance (Gi) Nausea And Vomiting    Chart Review: I personally reviewed active problem list, medication list, allergies, family history, social history, health maintenance, notes from last encounter, lab results, imaging with the patient/caregiver today.   Review of Systems  Constitutional: Negative.   HENT: Negative.    Eyes: Negative.   Respiratory: Negative.    Cardiovascular: Negative.   Gastrointestinal: Negative.   Endocrine: Negative.   Genitourinary: Negative.   Musculoskeletal: Negative.   Skin: Negative.   Allergic/Immunologic: Negative.   Neurological: Negative.   Hematological: Negative.   Psychiatric/Behavioral: Negative.     All other systems reviewed and are negative.    Objective:    Virtual encounter, vitals limited, only able to obtain the following There were no vitals filed for this visit. There is no height or weight on file to calculate BMI. Nursing Note and Vital Signs reviewed.  Physical Exam Vitals and nursing note reviewed.  Neck:  Trachea: Phonation normal.  Psychiatric:        Mood and Affect: Mood normal.     PE limited by telephone encounter  No results found. However, due to the size of the patient record, not all encounters were searched. Please check Results Review for a complete set of results.  Assessment and Plan:    Pt with uncontrolled IDDM presents for f/up on insulin titration - last OV was 2 weeks ago Plan at that time: pt instructed to increase basal insulin dose to 30 BID and then increase doses by 2-5 units every 3 d if fasting sugars still over 200, f/up endocrinology   Pt reports she did increase to 30 units BID but did not follow additional instructions and continues to have CBG >200  Reviewed again how to titrate up her basal insulin - recommended currently increasing QHS dose to 32-35 and monitor for a few days and see if fasting CBG can get below 200 - 100-150 would be better  She has f/up with endo in 1 week    1. Uncontrolled type 2 diabetes mellitus with hyperglycemia ([place]) As above - needs to have endo resume management at upcoming appt - multiple admissions for hypoglycemia, then insulin pump/omnipod d/c with last hospitalization May need CCM/pharm D to help assist with pt education and insulin titration - pt has not followed med recommendations with several f/up visits - though overall reported 200 lowest CBG is better than 300-500+ Lantus refilled with dose changes  - insulin glargine (LANTUS) 100 UNIT/ML Solostar Pen; Inject 30-50 Units into the skin 2 (two) times daily.  Dispense: 30 mL; Refill: 1  2. Insulin dependent type 2 diabetes  mellitus ([place]) See above - insulin glargine (LANTUS) 100 UNIT/ML Solostar Pen; Inject 30-50 Units into the skin 2 (two) times daily.  Dispense: 30 mL; Refill: 1  3. Encounter for screening mammogram for malignant neoplasm of breast - MM 3D SCREEN BREAST BILATERAL; Future  4. Encounter for medication monitoring - as above  - I discussed the assessment and treatment plan with the patient. The patient was provided an opportunity to ask questions and all were answered. The patient agreed with the plan and demonstrated an understanding of the instructions.  - The patient was advised to call back or seek an in-person evaluation if the symptoms worsen or if the condition fails to improve as anticipated.  I provided 20+ minutes of non-face-to-face time during this encounter.  Delsa Grana, [name] 12/15/21 2:19 PM

## 2021-12-17 ENCOUNTER — Encounter: Payer: Self-pay | Admitting: Family Medicine

## 2021-12-17 ENCOUNTER — Other Ambulatory Visit: Payer: Self-pay | Admitting: Physician Assistant

## 2021-12-17 DIAGNOSIS — R200 Anesthesia of skin: Secondary | ICD-10-CM

## 2021-12-17 DIAGNOSIS — R42 Dizziness and giddiness: Secondary | ICD-10-CM

## 2021-12-17 DIAGNOSIS — I639 Cerebral infarction, unspecified: Secondary | ICD-10-CM

## 2021-12-19 ENCOUNTER — Ambulatory Visit: Payer: Medicare Other

## 2021-12-21 ENCOUNTER — Other Ambulatory Visit: Payer: Self-pay | Admitting: Family Medicine

## 2021-12-21 DIAGNOSIS — I10 Essential (primary) hypertension: Secondary | ICD-10-CM

## 2021-12-21 DIAGNOSIS — E1159 Type 2 diabetes mellitus with other circulatory complications: Secondary | ICD-10-CM

## 2021-12-23 NOTE — Progress Notes (Incomplete)
Name: [name]   MRN: [id number]    DOB: 06-29-1965   Date:12/15/2021       Progress Note  Subjective:    Chief Complaint  Chief Complaint  Patient presents with  . Follow-up  . Diabetes    Sugars still running high    I connected with  [name] on 12/15/21 at  2:00 PM EST by telephone and verified that I am speaking with the correct person using two identifiers.   I discussed the limitations, risks, security and privacy concerns of performing an evaluation and management service by telephone and the availability of in person appointments. Staff also discussed with the patient that there may be a patient responsible charge related to this service.  Patient verbalized understanding and agreed to proceed with encounter. Patient Location: home Provider Location: [facility] clinic Additional Individuals present: none  HPI  F/up on blood sugars - uncontrolled after d/c of insulin pump - est with endo but difficulty with transportation has limited her ability to f/up with them  Fasting blood sugar this am 256, pt is currently doing lantus 30 units BID, and still doing the mealtime insulin sliding scale (Sliding scale insulin Less than 70 initiate hypoglycemia protocol 70-120 0 units 121-150 3 units 151-200 4 units 201-250 7 units 251-300 11 units 301-350 15 units 351-400 20 units Greater than 400 call MD ) Lowest sugar she has seen in several weeks is 200 She has f/up with her endocrinologist on 12/7/22023 She is still doing sliding scale novolog, trulicity, checking her sugars manually with glucometer        Patient Active Problem List   Diagnosis Date Noted  . Uncontrolled type 2 diabetes mellitus with hyperglycemia ([place]) 11/17/2021  . Insulin dependent type 2 diabetes mellitus ([place]) 11/17/2021  . Acute kidney injury ([place]) 10/15/2021  . Mild episode of recurrent major depressive disorder ([place]) 08/14/2021  . Hypoglycemia 07/07/2021  . UTI (urinary tract  infection) 07/07/2021  . Syncope 07/07/2021  . Diarrhea 06/09/2021  . Hyperkalemia 06/09/2021  . Intractable nausea and vomiting 06/08/2021  . Small bowel obstruction ([place]) 05/18/2021  . Orthostatic hypotension 04/29/2021  . Constipation 04/28/2021  . SBO (small bowel obstruction) ([place]) 04/23/2021  . Hypertensive urgency 04/23/2021  . GERD without esophagitis 04/23/2021  . AKI (acute kidney injury) ([place]) 04/23/2021  . Dyslipidemia 04/23/2021  . Chronic diastolic CHF (congestive heart failure) ([place]) 04/23/2021  . Cervical spondylosis 02/12/2021  . Lumbar facet arthropathy 02/12/2021  . Angina pectoris ([place]) 11/13/2020  . Major depressive disorder, recurrent episode, moderate ([place]) 11/13/2020  . Adrenal adenoma, right   . PAD (peripheral artery disease) ([place]) 01/24/2020  . Abnormal ankle brachial index (ABI) 12/18/2019  . CAD (coronary artery disease) 04/27/2019  . Diabetic polyneuropathy associated with type 2 diabetes mellitus ([place]) 03/02/2018  . Vitamin D deficiency 01/24/2018  . Overactive bladder 05/18/2017  . Dilated aortic root ([place])   . Sacroiliac dysfunction 10/09/2014  . Chronic low back pain 09/03/2014  . CVA, old, hemiparesis ([place]) 09/03/2014  . OSA (obstructive sleep apnea) 05/15/2014  . Dyslipidemia associated with type 2 diabetes mellitus ([place]) 01/21/2014  . Essential hypertension, benign 01/21/2014  . Chronic pain syndrome 01/21/2014  . Headache disorder 09/07/2013  . Mixed urge and stress incontinence 08/25/2011  . Ureteral stricture, right 08/25/2011  . Nephrolithiasis 08/22/2011  . Incisional hernia 05/10/2011  . Obesity, Class III, BMI 40-49.9 (morbid obesity) ([place]) 05/10/2011  . BLINDNESS, [place], [place] DEFINITION 09/14/2006  . Seizure ([place]) 09/13/2006  Social History   Tobacco Use  . Smoking status: Former    Packs/day: 1.00    Years: 15.00    Total pack years: 15.00    Types: Cigarettes    Quit date: 01/20/1987    Years since quitting: 34.9  .  Smokeless tobacco: Never  . Tobacco comments:    quit more than 20 years  Substance Use Topics  . Alcohol use: Yes    Alcohol/week: 1.0 standard drink of alcohol    Types: 1 Glasses of wine per week     Current Outpatient Medications:  .  baclofen (LIORESAL) 10 MG tablet, TAKE 1 TABLET BY MOUTH THREE TIMES A DAY AS NEEDED FOR MUSCLE SPASMS, Disp: 270 tablet, Rfl: 1 .  bisacodyl (DULCOLAX) 5 MG EC tablet, Take 1 tablet (5 mg total) by mouth daily as needed for moderate constipation., Disp: 30 tablet, Rfl: 0 .  blood glucose meter kit and supplies KIT, Dispense based on patient and insurance preference. Use up to four times daily as directed. (FOR E11.65 Z79.4)., Disp: 1 each, Rfl: 0 .  butalbital-acetaminophen-caffeine (FIORICET) 50-325-40 MG tablet, Take 1-2 tablets by mouth every 6 (six) hours as needed for headache., Disp: 10 tablet, Rfl: 0 .  carvedilol (COREG) 25 MG tablet, Take 25 mg by mouth 2 (two) times daily., Disp: , Rfl:  .  cholecalciferol (VITAMIN D3) 25 MCG (1000 UNIT) tablet, Take 1,000 Units by mouth daily., Disp: , Rfl:  .  ciclopirox (PENLAC) 8 % solution, Apply 1 Application topically at bedtime., Disp: , Rfl:  .  cloNIDine (CATAPRES) 0.1 MG tablet, Take 0.1 mg by mouth daily as needed (For blood pressure overm160/90)., Disp: , Rfl:  .  clopidogrel (PLAVIX) 75 MG tablet, Take 1 tablet (75 mg total) by mouth daily., Disp: 90 tablet, Rfl: 0 .  COVID-19 At Home Antigen Test KIT, Use as directed., Disp: 2 each, Rfl: 1 .  cycloSPORINE (RESTASIS) 0.05 % ophthalmic emulsion, Place 2 drops into both eyes 2 (two) times daily., Disp: , Rfl:  .  Dulaglutide (TRULICITY) 4.5 MW/4.1LK SOPN, Inject 4.5 mg as directed once a week., Disp: 6 mL, Rfl: 1 .  DULoxetine (CYMBALTA) 60 MG capsule, Take 1 capsule (60 mg total) by mouth daily., Disp: 90 capsule, Rfl: 3 .  EPINEPHrine 0.3 mg/0.3 mL IJ SOAJ injection, Inject 0.3 mg into the muscle as needed for anaphylaxis., Disp: , Rfl:  .   ezetimibe (ZETIA) 10 MG tablet, Take 1 tablet (10 mg total) by mouth daily., Disp: 90 tablet, Rfl: 3 .  furosemide (LASIX) 20 MG tablet, Take 20 mg by mouth., Disp: , Rfl:  .  gabapentin (NEURONTIN) 600 MG tablet, 600 mg qAM, 600 mg qPM, 1200 mg QHS (Patient taking differently: Take 600-1,200 mg by mouth See admin instructions. Take 1 tablet by mouth in the morning and then take 2 tablets by mouth at bedtime), Disp: 120 tablet, Rfl: 5 .  Glucose Blood (BLOOD GLUCOSE TEST STRIPS) STRP, Use as directed to monitor blood sugar up to 4x a day FOR E11.65 Z79.4, Disp: 300 strip, Rfl: 5 .  GVOKE HYPOPEN 2-PACK 1 MG/0.2ML SOAJ, Inject 1 mg into the skin once as needed for up to 1 dose (hypoglycemia). Only for severe low blood sugar not responding to drinking juice/glucose tablets, when CBG is <70, Disp: 0.4 mL, Rfl: 0 .  insulin aspart (NOVOLOG) 100 UNIT/ML injection, Inject 0-20 Units into the skin 3 (three) times daily with meals. Sliding scale insulin Less than 70 initiate  hypoglycemia protocol 70-120  0 units 121-150 3 units 151-200 4 units 201-250 7 units 251-300 11 units 301-350 15 units 351-400  20 units Greater than 400 call MD, Disp: 10 mL, Rfl: 11 .  insulin glargine (LANTUS) 100 UNIT/ML Solostar Pen, Inject 20-40 Units into the skin 2 (two) times daily., Disp: 15 mL, Rfl: 1 .  insulin lispro (HUMALOG) 100 UNIT/ML injection, Max daily dose 160 units, Disp: 20 mL, Rfl: 3 .  Lancets MISC, Use as directed to monitor blood sugar up to 4x a day FOR E11.65 Z79.4, Disp: 200 each, Rfl: 11 .  levETIRAcetam (KEPPRA) 500 MG tablet, Take 500-750 mg by mouth See admin instructions. Take 1 tablet (559m) by mouth every morning and take 1 tablets by mouth (7534m by mouth every night, Disp: , Rfl:  .  mirabegron ER (MYRBETRIQ) 50 MG TB24 tablet, Take 1 tablet (50 mg total) by mouth daily., Disp: 90 tablet, Rfl: 1 .  olmesartan (BENICAR) 40 MG tablet, Take 1 tablet (40 mg total) by mouth daily., Disp: 30 tablet, Rfl:  11 .  oxyCODONE (OXY IR/ROXICODONE) 5 MG immediate release tablet, Take 1 tablet (5 mg total) by mouth every 6 (six) hours as needed for breakthrough pain or severe pain., Disp: 10 tablet, Rfl: 0 .  pantoprazole (PROTONIX) 40 MG tablet, Take 1 tablet (40 mg total) by mouth daily., Disp: 30 tablet, Rfl: 3 .  polyethylene glycol (MIRALAX / GLYCOLAX) 17 g packet, Take 17 g by mouth daily as needed for mild constipation., Disp: 100 each, Rfl: 3 .  rosuvastatin (CRESTOR) 10 MG tablet, Take 1 tablet (10 mg total) by mouth daily., Disp: 90 tablet, Rfl: 3 .  senna-docusate (SENOKOT-S) 8.6-50 MG tablet, Take 1 tablet by mouth at bedtime as needed for mild constipation., Disp: 60 tablet, Rfl: 0 .  sodium phosphate (FLEET) 7-19 GM/118ML ENEM, Place 133 mLs (1 enema total) rectally daily as needed for severe constipation., Disp: 133 mL, Rfl: 2 .  WIXELA INHUB 250-50 MCG/DOSE AEPB, Inhale 1 puff into the lungs 2 (two) times daily., Disp: , Rfl:  .  amitriptyline (ELAVIL) 25 MG tablet, Take 2-3 tablets (50-75 mg total) by mouth at bedtime. (Patient taking differently: Take 75 mg by mouth at bedtime.), Disp: 90 tablet, Rfl: 5  Allergies  Allergen Reactions  . Codeine Swelling and Other (See Comments)    Swelling and burning of mouth (inside)  . FaWilder GladeDapagliflozin] Other (See Comments)    Recurrent UTI with farxiga  . Lactose Intolerance (Gi) Nausea And Vomiting    Chart Review: I personally reviewed active problem list, medication list, allergies, family history, social history, health maintenance, notes from last encounter, lab results, imaging with the patient/caregiver today.   Review of Systems   Objective:    Virtual encounter, vitals limited, only able to obtain the following There were no vitals filed for this visit. There is no height or weight on file to calculate BMI. Nursing Note and Vital Signs reviewed.  Physical Exam  PE limited by telephone encounter  No results found.  However, due to the size of the patient record, not all encounters were searched. Please check Results Review for a complete set of results.  Assessment and Plan:     ICD-10-CM   1. Uncontrolled type 2 diabetes mellitus with hyperglycemia (HCC)  E11.65     2. Insulin dependent type 2 diabetes mellitus ([place] E11.9    Z79.4     3. Need for hepatitis C screening test  Z11.59     4. Screening for colon cancer  Z12.11     5. Encounter for screening mammogram for malignant neoplasm of breast  Z12.31       -Red flags and when to present for emergency care or RTC including but not limited to new/worsening/un-resolving symptoms, *** reviewed with patient at time of visit. Follow up and care instructions discussed and provided in AVS. - I discussed the assessment and treatment plan with the patient. The patient was provided an opportunity to ask questions and all were answered. The patient agreed with the plan and demonstrated an understanding of the instructions.  - The patient was advised to call back or seek an in-person evaluation if the symptoms worsen or if the condition fails to improve as anticipated.  I provided *** minutes of non-face-to-face time during this encounter.  Delsa Grana, [name] 12/15/21 2:19 PM

## 2021-12-25 ENCOUNTER — Ambulatory Visit: Admission: RE | Admit: 2021-12-25 | Payer: Medicare Other | Source: Ambulatory Visit

## 2021-12-25 ENCOUNTER — Ambulatory Visit: Payer: Medicare Other | Admitting: Endocrinology

## 2021-12-29 ENCOUNTER — Encounter: Payer: Self-pay | Admitting: Family Medicine

## 2022-01-08 ENCOUNTER — Other Ambulatory Visit: Payer: Self-pay | Admitting: Student in an Organized Health Care Education/Training Program

## 2022-01-18 ENCOUNTER — Other Ambulatory Visit: Payer: Self-pay | Admitting: Student in an Organized Health Care Education/Training Program

## 2022-01-18 DIAGNOSIS — G890 Central pain syndrome: Secondary | ICD-10-CM

## 2022-01-18 DIAGNOSIS — G894 Chronic pain syndrome: Secondary | ICD-10-CM

## 2022-01-18 DIAGNOSIS — E1142 Type 2 diabetes mellitus with diabetic polyneuropathy: Secondary | ICD-10-CM

## 2022-01-20 ENCOUNTER — Ambulatory Visit: Payer: Medicare Other | Admitting: Family Medicine

## 2022-01-23 ENCOUNTER — Encounter: Payer: Self-pay | Admitting: Family Medicine

## 2022-01-23 ENCOUNTER — Ambulatory Visit (INDEPENDENT_AMBULATORY_CARE_PROVIDER_SITE_OTHER): Payer: Medicare Other | Admitting: Family Medicine

## 2022-01-23 VITALS — BP 134/72 | HR 97 | Temp 98.1°F | Resp 18 | Ht 66.5 in | Wt 228.5 lb

## 2022-01-23 DIAGNOSIS — R350 Frequency of micturition: Secondary | ICD-10-CM | POA: Diagnosis not present

## 2022-01-23 DIAGNOSIS — R8290 Unspecified abnormal findings in urine: Secondary | ICD-10-CM | POA: Diagnosis not present

## 2022-01-23 DIAGNOSIS — E119 Type 2 diabetes mellitus without complications: Secondary | ICD-10-CM

## 2022-01-23 DIAGNOSIS — Z1159 Encounter for screening for other viral diseases: Secondary | ICD-10-CM

## 2022-01-23 DIAGNOSIS — I77810 Thoracic aortic ectasia: Secondary | ICD-10-CM

## 2022-01-23 DIAGNOSIS — R1084 Generalized abdominal pain: Secondary | ICD-10-CM | POA: Diagnosis not present

## 2022-01-23 DIAGNOSIS — K529 Noninfective gastroenteritis and colitis, unspecified: Secondary | ICD-10-CM

## 2022-01-23 DIAGNOSIS — I5032 Chronic diastolic (congestive) heart failure: Secondary | ICD-10-CM | POA: Diagnosis not present

## 2022-01-23 DIAGNOSIS — R112 Nausea with vomiting, unspecified: Secondary | ICD-10-CM | POA: Diagnosis not present

## 2022-01-23 DIAGNOSIS — Z0289 Encounter for other administrative examinations: Secondary | ICD-10-CM

## 2022-01-23 DIAGNOSIS — F330 Major depressive disorder, recurrent, mild: Secondary | ICD-10-CM

## 2022-01-23 DIAGNOSIS — H548 Legal blindness, as defined in USA: Secondary | ICD-10-CM

## 2022-01-23 DIAGNOSIS — G4733 Obstructive sleep apnea (adult) (pediatric): Secondary | ICD-10-CM

## 2022-01-23 DIAGNOSIS — K219 Gastro-esophageal reflux disease without esophagitis: Secondary | ICD-10-CM | POA: Diagnosis not present

## 2022-01-23 DIAGNOSIS — R569 Unspecified convulsions: Secondary | ICD-10-CM

## 2022-01-23 DIAGNOSIS — E1165 Type 2 diabetes mellitus with hyperglycemia: Secondary | ICD-10-CM

## 2022-01-23 DIAGNOSIS — Z794 Long term (current) use of insulin: Secondary | ICD-10-CM

## 2022-01-23 DIAGNOSIS — I69354 Hemiplegia and hemiparesis following cerebral infarction affecting left non-dominant side: Secondary | ICD-10-CM | POA: Diagnosis not present

## 2022-01-23 NOTE — Progress Notes (Unsigned)
Name: [name]   MRN: [id number]    DOB: 08-15-1965   Date:01/23/2022       Progress Note  Chief Complaint  Patient presents with   Follow-up   Diabetes     Subjective:   [name] is a 57 y.o. female, presents to clinic for f/up on DM  Uncontrolled A1C- IDDM, she still has not f/up with endocrinology due to transportation issues [facility] - 01/15/2022 A1C with [facility] RN POC A1C 12.8 Recent pertinent labs: Lab Results  Component Value Date   HGBA1C 13.7 (H) 10/15/2021   HGBA1C 8 07/26/2021   HGBA1C 7.5 (H) 04/26/2021   Losing weight, thirsty, urinary frequency She has increased basal insulin to 30 units BID and she is still doing SSI mealtime She states she feels bad if glucose goes under 200, the lowest she has seen lately is 180-190 she reports  Urine frequency- continued  urine is cloudy and brown sediment/brown specks in it  Going to skilled care for upcoming colonoscopy - she says she is here for PCP to help do that but she doesn't have forms wit her.      Current Outpatient Medications:    baclofen (LIORESAL) 10 MG tablet, TAKE 1 TABLET BY MOUTH THREE TIMES A DAY AS NEEDED FOR MUSCLE SPASMS, Disp: 270 tablet, Rfl: 1   bisacodyl (DULCOLAX) 5 MG EC tablet, Take 1 tablet (5 mg total) by mouth daily as needed for moderate constipation., Disp: 30 tablet, Rfl: 0   blood glucose meter kit and supplies KIT, Dispense based on patient and insurance preference. Use up to four times daily as directed. (FOR E11.65 Z79.4)., Disp: 1 each, Rfl: 0   butalbital-acetaminophen-caffeine (FIORICET) 50-325-40 MG tablet, Take 1-2 tablets by mouth every 6 (six) hours as needed for headache., Disp: 10 tablet, Rfl: 0   carvedilol (COREG) 25 MG tablet, Take 25 mg by mouth 2 (two) times daily., Disp: , Rfl:    cholecalciferol (VITAMIN D3) 25 MCG (1000 UNIT) tablet, Take 1,000 Units by mouth daily., Disp: , Rfl:    ciclopirox (PENLAC) 8 % solution, Apply 1 Application  topically at bedtime., Disp: , Rfl:    cloNIDine (CATAPRES) 0.1 MG tablet, Take 0.1 mg by mouth daily as needed (For blood pressure overm160/90)., Disp: , Rfl:    clopidogrel (PLAVIX) 75 MG tablet, Take 1 tablet (75 mg total) by mouth daily., Disp: 90 tablet, Rfl: 0   COVID-19 At Home Antigen Test KIT, Use as directed., Disp: 2 each, Rfl: 1   cycloSPORINE (RESTASIS) 0.05 % ophthalmic emulsion, Place 2 drops into both eyes 2 (two) times daily., Disp: , Rfl:    Dulaglutide (TRULICITY) 4.5 HY/0.7PX SOPN, Inject 4.5 mg as directed once a week., Disp: 6 mL, Rfl: 1   DULoxetine (CYMBALTA) 60 MG capsule, Take 1 capsule (60 mg total) by mouth daily., Disp: 90 capsule, Rfl: 3   EPINEPHrine 0.3 mg/0.3 mL IJ SOAJ injection, Inject 0.3 mg into the muscle as needed for anaphylaxis., Disp: , Rfl:    ezetimibe (ZETIA) 10 MG tablet, Take 1 tablet (10 mg total) by mouth daily., Disp: 90 tablet, Rfl: 3   furosemide (LASIX) 20 MG tablet, Take 20 mg by mouth., Disp: , Rfl:    gabapentin (NEURONTIN) 600 MG tablet, 600 mg qAM, 600 mg qPM, 1200 mg QHS (Patient taking differently: Take 600-1,200 mg by mouth See admin instructions. Take 1 tablet by mouth in the morning and then take 2 tablets by mouth at bedtime),  Disp: 120 tablet, Rfl: 5   Glucose Blood (BLOOD GLUCOSE TEST STRIPS) STRP, Use as directed to monitor blood sugar up to 4x a day FOR E11.65 Z79.4, Disp: 300 strip, Rfl: 5   GVOKE HYPOPEN 2-PACK 1 MG/0.2ML SOAJ, Inject 1 mg into the skin once as needed for up to 1 dose (hypoglycemia). Only for severe low blood sugar not responding to drinking juice/glucose tablets, when CBG is <70, Disp: 0.4 mL, Rfl: 0   insulin aspart (NOVOLOG) 100 UNIT/ML injection, Inject 0-20 Units into the skin 3 (three) times daily with meals. Sliding scale insulin Less than 70 initiate hypoglycemia protocol 70-120  0 units 121-150 3 units 151-200 4 units 201-250 7 units 251-300 11 units 301-350 15 units 351-400  20 units Greater than 400 call  MD, Disp: 10 mL, Rfl: 11   insulin glargine (LANTUS) 100 UNIT/ML Solostar Pen, Inject 30-50 Units into the skin 2 (two) times daily., Disp: 30 mL, Rfl: 1   insulin lispro (HUMALOG) 100 UNIT/ML injection, Max daily dose 160 units, Disp: 20 mL, Rfl: 3   Lancets MISC, Use as directed to monitor blood sugar up to 4x a day FOR E11.65 Z79.4, Disp: 200 each, Rfl: 11   levETIRAcetam (KEPPRA) 500 MG tablet, Take 500-750 mg by mouth See admin instructions. Take 1 tablet ('500mg'$ ) by mouth every morning and take 1 tablets by mouth ('750mg'$ ) by mouth every night, Disp: , Rfl:    mirabegron ER (MYRBETRIQ) 50 MG TB24 tablet, Take 1 tablet (50 mg total) by mouth daily., Disp: 90 tablet, Rfl: 1   olmesartan (BENICAR) 40 MG tablet, Take 1 tablet (40 mg total) by mouth daily., Disp: 30 tablet, Rfl: 11   oxyCODONE (OXY IR/ROXICODONE) 5 MG immediate release tablet, Take 1 tablet (5 mg total) by mouth every 6 (six) hours as needed for breakthrough pain or severe pain., Disp: 10 tablet, Rfl: 0   pantoprazole (PROTONIX) 40 MG tablet, Take 1 tablet (40 mg total) by mouth daily., Disp: 30 tablet, Rfl: 3   polyethylene glycol (MIRALAX / GLYCOLAX) 17 g packet, Take 17 g by mouth daily as needed for mild constipation., Disp: 100 each, Rfl: 3   rosuvastatin (CRESTOR) 10 MG tablet, Take 1 tablet (10 mg total) by mouth daily., Disp: 90 tablet, Rfl: 3   senna-docusate (SENOKOT-S) 8.6-50 MG tablet, Take 1 tablet by mouth at bedtime as needed for mild constipation., Disp: 60 tablet, Rfl: 0   sodium phosphate (FLEET) 7-19 GM/118ML ENEM, Place 133 mLs (1 enema total) rectally daily as needed for severe constipation., Disp: 133 mL, Rfl: 2   WIXELA INHUB 250-50 MCG/DOSE AEPB, Inhale 1 puff into the lungs 2 (two) times daily., Disp: , Rfl:    amitriptyline (ELAVIL) 25 MG tablet, Take 2-3 tablets (50-75 mg total) by mouth at bedtime. (Patient taking differently: Take 75 mg by mouth at bedtime.), Disp: 90 tablet, Rfl: 5  Patient Active Problem  List   Diagnosis Date Noted   Uncontrolled type 2 diabetes mellitus with hyperglycemia ([place]) 11/17/2021   Insulin dependent type 2 diabetes mellitus ([place]) 11/17/2021   Acute kidney injury ([place]) 10/15/2021   Mild episode of recurrent major depressive disorder ([place]) 08/14/2021   Hypoglycemia 07/07/2021   UTI (urinary tract infection) 07/07/2021   Syncope 07/07/2021   Diarrhea 06/09/2021   Hyperkalemia 06/09/2021   Intractable nausea and vomiting 06/08/2021   Small bowel obstruction ([place]) 05/18/2021   Orthostatic hypotension 04/29/2021   Constipation 04/28/2021   SBO (small bowel obstruction) ([place]) 04/23/2021  Hypertensive urgency 04/23/2021   GERD without esophagitis 04/23/2021   AKI (acute kidney injury) ([place]) 04/23/2021   Dyslipidemia 04/23/2021   Chronic diastolic CHF (congestive heart failure) ([place]) 04/23/2021   Cervical spondylosis 02/12/2021   Lumbar facet arthropathy 02/12/2021   Angina pectoris ([place]) 11/13/2020   Major depressive disorder, recurrent episode, moderate ([place]) 11/13/2020   Adrenal adenoma, right    PAD (peripheral artery disease) ([place]) 01/24/2020   Abnormal ankle brachial index (ABI) 12/18/2019   CAD (coronary artery disease) 04/27/2019   Diabetic polyneuropathy associated with type 2 diabetes mellitus ([place]) 03/02/2018   Vitamin D deficiency 01/24/2018   Overactive bladder 05/18/2017   Dilated aortic root (HCC)    Sacroiliac dysfunction 10/09/2014   Chronic low back pain 09/03/2014   CVA, old, hemiparesis ([place]) 09/03/2014   OSA (obstructive sleep apnea) 05/15/2014   Dyslipidemia associated with type 2 diabetes mellitus ([place]) 01/21/2014   Essential hypertension, benign 01/21/2014   Chronic pain syndrome 01/21/2014   Headache disorder 09/07/2013   Mixed urge and stress incontinence 08/25/2011   Ureteral stricture, right 08/25/2011   Nephrolithiasis 08/22/2011   Incisional hernia 05/10/2011   Obesity, Class III, BMI 40-49.9 (morbid obesity) ([place])  05/10/2011   BLINDNESS, LEGAL, [place] DEFINITION 09/14/2006   Seizure ([place]) 09/13/2006    Past Surgical History:  Procedure Laterality Date   BRAIN HEMATOMA EVACUATION     five procedures total, first procedure when 21 months old, last at 57 years of age.   CYSTOSCOPY W/ RETROGRADES  09/24/2011   Procedure: CYSTOSCOPY WITH RETROGRADE PYELOGRAM;  Surgeon: Malka So, MD;  Location: [facility];  Service: Urology;  Laterality: Right;   CYSTOSCOPY WITH URETEROSCOPY  09/24/2011   Procedure: CYSTOSCOPY WITH URETEROSCOPY;  Surgeon: Malka So, MD;  Location: [facility];  Service: Urology;  Laterality: Right;  Balloon dilation right ureter    CYSTOSCOPY/RETROGRADE/URETEROSCOPY  08/24/2011   Procedure: CYSTOSCOPY/RETROGRADE/URETEROSCOPY;  Surgeon: Molli Hazard, MD;  Location: [facility];  Service: Urology;  Laterality: Right;  Cysto, Right retrograde Pyelogram, right stent placement.    INCISIONAL HERNIA REPAIR  05/05/2011   Procedure: LAPAROSCOPIC INCISIONAL HERNIA;  Surgeon: Rolm Bookbinder, MD;  Location: [facility];  Service: General;  Laterality: N/A;   kidney stone removal     LAPAROTOMY N/A 06/12/2021   Procedure: EXPLORATORY LAPAROTOMY, LYSIS OF ADHESIONS;  Surgeon: Olean Ree, MD;  Location: [facility];  Service: General;  Laterality: N/A;   MASS EXCISION  05/05/2011   Procedure: EXCISION MASS;  Surgeon: Rolm Bookbinder, MD;  Location: [facility];  Service: General;  Laterality: Right;   PCNL     RETINAL DETACHMENT SURGERY Left 1990   RIGHT/LEFT HEART CATH AND CORONARY ANGIOGRAPHY N/A 04/28/2019   Procedure: RIGHT/LEFT HEART CATH AND CORONARY ANGIOGRAPHY;  Surgeon: Yolonda Kida, MD;  Location: [facility];  Service: Cardiovascular;  Laterality: N/A;   SHUNT REMOVAL     shunt inserted at age 46 removed at age 81    [place]    Family History  Problem Relation Age of Onset   Uterine cancer Mother    Hypertension Mother    Cancer Mother    Brain cancer Maternal  Grandmother    Hypertension Maternal Grandmother    Birth defects Daughter    Hypertension Daughter    Cirrhosis Maternal Grandfather    ADD / ADHD Maternal Grandfather    Birth defects Maternal Grandfather    Diabetes Maternal Grandfather    Anesthesia problems Neg Hx  Colon cancer Neg Hx    Esophageal cancer Neg Hx    Pancreatic cancer Neg Hx     Social History   Tobacco Use   Smoking status: Former    Packs/day: 1.00    Years: 15.00    Total pack years: 15.00    Types: Cigarettes    Quit date: 01/20/1987    Years since quitting: 35.0   Smokeless tobacco: Never   Tobacco comments:    quit more than 20 years  Vaping Use   Vaping Use: Never used  Substance Use Topics   Alcohol use: Yes    Alcohol/week: 1.0 standard drink of alcohol    Types: 1 Glasses of wine per week   Drug use: No    Comment: hx of marijuana use no longer uses      Allergies  Allergen Reactions   Codeine Swelling and Other (See Comments)    Swelling and burning of mouth (inside)   [place] [Dapagliflozin] Other (See Comments)    Recurrent UTI with farxiga   Lactose Intolerance (Gi) Nausea And Vomiting    Health Maintenance  Topic Date Due   Hepatitis C Screening  Never done   MAMMOGRAM  08/12/2017   FOOT EXAM  09/22/2019   PAP-Cervical Cytology Screening  06/23/2021   PAP SMEAR-Modifier  06/23/2021   COVID-19 Vaccine (4 - 2023-24 season) 09/19/2021   Medicare Annual Wellness (AWV)  03/21/2022   Zoster Vaccines- Shingrix (1 of 2) 03/17/2022 (Originally 09/22/1983)   Fecal DNA (Cologuard)  12/16/2022 (Originally 09/21/2009)   HEMOGLOBIN A1C  04/15/2022   OPHTHALMOLOGY EXAM  09/18/2022   Diabetic kidney evaluation - eGFR measurement  11/18/2022   Diabetic kidney evaluation - Urine ACR  11/18/2022   DTaP/Tdap/Td (2 - Td or Tdap) 06/22/2028   INFLUENZA VACCINE  Completed   HIV Screening  Completed   HPV VACCINES  Aged Out   COLONOSCOPY (Pts 45-65yr Insurance coverage will need to be  confirmed)  Discontinued    Chart Review Today: I personally reviewed active problem list, medication list, allergies, family history, social history, health maintenance, notes from last encounter, lab results, imaging with the patient/caregiver today.   Review of Systems  Constitutional: Negative.   HENT: Negative.    Eyes: Negative.   Respiratory: Negative.    Cardiovascular: Negative.   Gastrointestinal: Negative.   Endocrine: Negative.   Genitourinary: Negative.   Musculoskeletal: Negative.   Skin: Negative.   Allergic/Immunologic: Negative.   Neurological: Negative.   Hematological: Negative.   Psychiatric/Behavioral: Negative.    All other systems reviewed and are negative.    Objective:   Vitals:   01/23/22 1119  BP: 134/72  Pulse: 97  Resp: 18  Temp: 98.1 F (36.7 C)  TempSrc: Oral  SpO2: 98%  Weight: 228 lb 8 oz (103.6 kg)  Height: 5' 6.5" (1.689 m)    Body mass index is 36.33 kg/m.  Physical Exam Vitals and nursing note reviewed.  Constitutional:      General: She is not in acute distress.    Appearance: Normal appearance. She is well-developed. She is not ill-appearing, toxic-appearing or diaphoretic.     Interventions: Face mask in place.  HENT:     Head: Normocephalic and atraumatic.     Right Ear: External ear normal.     Left Ear: External ear normal.  Eyes:     General: Lids are normal. No scleral icterus.       Right eye: No discharge.  Left eye: No discharge.     Conjunctiva/sclera: Conjunctivae normal.  Neck:     Trachea: Phonation normal. No tracheal deviation.  Cardiovascular:     Rate and Rhythm: Normal rate and regular rhythm.     Pulses: Normal pulses.          Radial pulses are 2+ on the right side and 2+ on the left side.       Posterior tibial pulses are 2+ on the right side and 2+ on the left side.     Heart sounds: Normal heart sounds. No murmur heard.    No friction rub. No gallop.  Pulmonary:     Effort:  Pulmonary effort is normal. No respiratory distress.     Breath sounds: Normal breath sounds. No stridor. No wheezing, rhonchi or rales.  Chest:     Chest wall: No tenderness.  Abdominal:     General: Bowel sounds are normal. There is no distension.     Palpations: Abdomen is soft.  Musculoskeletal:     Right lower leg: No edema.     Left lower leg: No edema.  Skin:    General: Skin is warm and dry.     Coloration: Skin is not jaundiced or pale.     Findings: No rash.  Neurological:     Mental Status: She is alert.     Motor: No abnormal muscle tone.     Gait: Gait normal.     Comments: Left hemiplegia, using rolling walker  Psychiatric:        Mood and Affect: Mood normal.        Speech: Speech normal.        Behavior: Behavior normal.         Assessment & Plan:   Problem List Items Addressed This Visit       Cardiovascular and Mediastinum   Chronic diastolic CHF (congestive heart failure) (HCC) (Chronic)    She appears euvolemic      Relevant Orders   COMPLETE METABOLIC PANEL WITH GFR (Completed)   Dilated aortic root (HCC)     Respiratory   OSA (obstructive sleep apnea)    She endorses compliance with CPAP        Digestive   GERD without esophagitis    On PPI, ongoing GI sx, she is pending procedure with GI      Intractable nausea and vomiting    I would inquire of GI and endocrinology if pt should decrease GLP-1 use with all these severe GI issues occurring pt states after dose was increased Her DM is uncontrolled, but the sx have sig neg impact on life, multiple hospitalizations, surgery, GI consult - I do not know if anyone has addressed this.  Advised pt to bring it up to Dr. Virgina Jock (GI) and to f/up with endo      Relevant Orders   CBC with Differential/Platelet (Completed)   COMPLETE METABOLIC PANEL WITH GFR (Completed)     Endocrine   Uncontrolled type 2 diabetes mellitus with hyperglycemia (HCC) - Primary    Severely uncontrolled since insulin  pump was removed - it was malfunctioning, causing hypoglycemia, she had multiple hospitalizations for this last year and only followed up with endocrinology once.  DM:   Pt managing DM with endo - however not seen for quite a while due to transportation difficulties/barriers - Dr. Dwyane Dee On SSI per endo - humalog Lantus - should be titrating to goal fasting CBG's - pt has not followed recommendations  for titration for months She reports currently doing 30 units lantus BID Also on trulicity 4.5 mg dose - done by endo previously, last refill sent in by me when she did not go to endo appt, but I am concerned this could be causing GI sx Endorses weight loss, polyuria, polydipsia, continued chronic neuropathy Denies: hypoglycemia Recent pertinent labs: Lab Results  Component Value Date   HGBA1C 13.7 (H) 10/15/2021   HGBA1C 8 07/26/2021   HGBA1C 7.5 (H) 04/26/2021   HGBA1C 8.2 (A) 01/06/2021   HGBA1C 8.7 (H) 09/30/2020    Lab Results  Component Value Date   MICROALBUR 0.9 11/17/2021   LDLCALC 67 06/02/2021   CREATININE 0.95 01/23/2022   Standard of care and health maintenance: Urine Microalbumin:  done today Foot exam:  done today         Relevant Orders   COMPLETE METABOLIC PANEL WITH GFR (Completed)   Hemoglobin A1c (Completed)   Microalbumin / creatinine urine ratio   Insulin dependent type 2 diabetes mellitus ([place])    Still has not f/up with endo due to transportation issues Uncontrolled - A1C 13.7, repeat today She did titrate basal insulin a little but still not at goal, doing 30 units BID + SSI  Encouraged her to titrate more to goals      Relevant Orders   COMPLETE METABOLIC PANEL WITH GFR (Completed)   Hemoglobin A1c (Completed)   Microalbumin / creatinine urine ratio     Nervous and Auditory   Hemiparesis affecting left side as late effect of cerebrovascular accident (CVA) (HCC)    Stable, on plavix, ASA held, on statin and zetia        Other   BLINDNESS,  LEGAL, [place] DEFINITION    Left eye legally blind      Seizure [facility])    Per neurology      Mild episode of recurrent major depressive disorder ([place])   Other Visit Diagnoses     Urinary frequency       may be from [place] with hyperglycemia and severly uncontrolled DM - will check for proteinuria, UTI   Relevant Orders   Microalbumin / creatinine urine ratio   Urine Culture   Urinalysis, Routine w reflex microscopic   Abnormal urine sediment       brown sediment in urine with frequency, its also cloudy - urine testing today   Relevant Orders   Microalbumin / creatinine urine ratio   Urine Culture   Urinalysis, Routine w reflex microscopic   Generalized abdominal pain       working with GI - consider decreasing GLP-1 dose (per endo management) ask GI about it   Relevant Orders   CBC with Differential/Platelet (Completed)   COMPLETE METABOLIC PANEL WITH GFR (Completed)   Hepatitis C antibody   HIV Antibody (routine testing w rflx)   Encounter for completion of form with patient       pt needs to get the FL2 forms and do a f/up appt for their completion - virtual ok - using most dx from this encounter, but care level needs review to complete   Encounter for hepatitis C screening test for low risk patient       Relevant Orders   Hepatitis C antibody        Return for will need telephone visit for completing FL2 with pt .   Delsa Grana, [name] 01/23/22 11:23 AM

## 2022-01-25 ENCOUNTER — Encounter: Payer: Self-pay | Admitting: Family Medicine

## 2022-01-25 NOTE — Assessment & Plan Note (Signed)
She endorses compliance with CPAP

## 2022-01-25 NOTE — Assessment & Plan Note (Signed)
>>  ASSESSMENT AND PLAN FOR SEIZURE (HCC) WRITTEN ON 01/25/2022 12:16 PM BY Chrisie Jankovich, [name]  Per neurology

## 2022-01-25 NOTE — Assessment & Plan Note (Signed)
I would inquire of GI and endocrinology if pt should decrease GLP-1 use with all these severe GI issues occurring pt states after dose was increased Her DM is uncontrolled, but the sx have sig neg impact on life, multiple hospitalizations, surgery, GI consult - I do not know if anyone has addressed this.  Advised pt to bring it up to Dr. Virgina Jock (GI) and to f/up with endo

## 2022-01-25 NOTE — Assessment & Plan Note (Signed)
She appears euvolemic

## 2022-01-25 NOTE — Assessment & Plan Note (Signed)
Still has not f/up with endo due to transportation issues Uncontrolled - A1C 13.7, repeat today She did titrate basal insulin a little but still not at goal, doing 30 units BID + SSI  Encouraged her to titrate more to goals

## 2022-01-25 NOTE — Assessment & Plan Note (Signed)
Per neurology 

## 2022-01-25 NOTE — Assessment & Plan Note (Signed)
On PPI, ongoing GI sx, she is pending procedure with GI

## 2022-01-25 NOTE — Assessment & Plan Note (Signed)
Left eye legally blind

## 2022-01-25 NOTE — Assessment & Plan Note (Signed)
Severely uncontrolled since insulin pump was removed - it was malfunctioning, causing hypoglycemia, she had multiple hospitalizations for this last year and only followed up with endocrinology once.  DM:   Pt managing DM with endo - however not seen for quite a while due to transportation difficulties/barriers - Dr. Dwyane Dee On SSI per endo - humalog Lantus - should be titrating to goal fasting CBG's - pt has not followed recommendations for titration for months She reports currently doing 30 units lantus BID Also on trulicity 4.5 mg dose - done by endo previously, last refill sent in by me when she did not go to endo appt, but I am concerned this could be causing GI sx Endorses weight loss, polyuria, polydipsia, continued chronic neuropathy Denies: hypoglycemia Recent pertinent labs: Lab Results  Component Value Date   HGBA1C 13.7 (H) 10/15/2021   HGBA1C 8 07/26/2021   HGBA1C 7.5 (H) 04/26/2021   HGBA1C 8.2 (A) 01/06/2021   HGBA1C 8.7 (H) 09/30/2020     Lab Results  Component Value Date   MICROALBUR 0.9 11/17/2021   LDLCALC 67 06/02/2021   CREATININE 0.95 01/23/2022   Standard of care and health maintenance: Urine Microalbumin:  done today Foot exam:  done today

## 2022-01-25 NOTE — Assessment & Plan Note (Signed)
>>  ASSESSMENT AND PLAN FOR UNCONTROLLED TYPE 2 DIABETES MELLITUS WITH HYPERGLYCEMIA (HCC) WRITTEN ON 01/25/2022 12:14 PM BY Abdinasir Spadafore, [name]  Severely uncontrolled since insulin  pump was removed - it was malfunctioning, causing hypoglycemia, she had multiple hospitalizations for this last year and only followed up with endocrinology once.  DM:   Pt managing DM with endo - however not seen for quite a while due to transportation difficulties/barriers - Dr. Von On SSI per endo - humalog  Lantus  - should be titrating to goal fasting CBG's - pt has not followed recommendations for titration for months She reports currently doing 30 units lantus  BID Also on trulicity  4.5 mg dose - done by endo previously, last refill sent in by me when she did not go to endo appt, but I am concerned this could be causing GI sx Endorses weight loss, polyuria, polydipsia, continued chronic neuropathy Denies: hypoglycemia Recent pertinent labs: Lab Results  Component Value Date   HGBA1C 13.7 (H) 10/15/2021   HGBA1C 8 07/26/2021   HGBA1C 7.5 (H) 04/26/2021   HGBA1C 8.2 (A) 01/06/2021   HGBA1C 8.7 (H) 09/30/2020     Lab Results  Component Value Date   MICROALBUR 0.9 11/17/2021   LDLCALC 67 06/02/2021   CREATININE 0.95 01/23/2022   Standard of care and health maintenance: Urine Microalbumin:  done today Foot exam:  done today

## 2022-01-25 NOTE — Assessment & Plan Note (Signed)
Stable, on plavix, ASA held, on statin and zetia

## 2022-01-25 NOTE — Patient Instructions (Signed)
Please ask Dr. Dwyane Dee and Dr. Virgina Jock about Trulicity dose and your GI symptoms - if you remember the GI symptoms being related to the last trulicity dose increase I think that would be very important to discuss. If you stop trulicity or change to another GLP-1 I would recommend a significant dose decrease - and that would make your already uncontrolled diabetes much worse - so you need to work closely with your endocrinologist  Please ask Dr. Virgina Jock if trulicity needs to be stopped before any of your upcoming surgeries  Please increase you basal insulin like we discussed

## 2022-01-26 LAB — COMPLETE METABOLIC PANEL WITH GFR
AG Ratio: 1.4 (calc) (ref 1.0–2.5)
ALT: 15 U/L (ref 6–29)
AST: 9 U/L — ABNORMAL LOW (ref 10–35)
Albumin: 4.6 g/dL (ref 3.6–5.1)
Alkaline phosphatase (APISO): 105 U/L (ref 37–153)
BUN: 20 mg/dL (ref 7–25)
CO2: 25 mmol/L (ref 20–32)
Calcium: 9.7 mg/dL (ref 8.6–10.4)
Chloride: 96 mmol/L — ABNORMAL LOW (ref 98–110)
Creat: 0.95 mg/dL (ref 0.50–1.03)
Globulin: 3.4 g/dL (calc) (ref 1.9–3.7)
Glucose, Bld: 403 mg/dL — ABNORMAL HIGH (ref 65–99)
Potassium: 4.6 mmol/L (ref 3.5–5.3)
Sodium: 135 mmol/L (ref 135–146)
Total Bilirubin: 0.4 mg/dL (ref 0.2–1.2)
Total Protein: 8 g/dL (ref 6.1–8.1)
eGFR: 70 mL/min/{1.73_m2} (ref >=60)

## 2022-01-26 LAB — CBC WITH DIFFERENTIAL/PLATELET
Absolute Monocytes: 577 cells/uL (ref 200–950)
Basophils Absolute: 22 cells/uL (ref 0–200)
Basophils Relative: 0.3 %
Eosinophils Absolute: 30 cells/uL (ref 15–500)
Eosinophils Relative: 0.4 %
HCT: 40.9 % (ref 35.0–45.0)
Hemoglobin: 13.5 g/dL (ref 11.7–15.5)
Lymphs Abs: 1672 cells/uL (ref 850–3900)
MCH: 29.7 pg (ref 27.0–33.0)
MCHC: 33 g/dL (ref 32.0–36.0)
MCV: 89.9 fL (ref 80.0–100.0)
MPV: 12.4 fL (ref 7.5–12.5)
Monocytes Relative: 7.8 %
Neutro Abs: 5099 cells/uL (ref 1500–7800)
Neutrophils Relative %: 68.9 %
Platelets: 269 10*3/uL (ref 140–400)
RBC: 4.55 10*6/uL (ref 3.80–5.10)
RDW: 13 % (ref 11.0–15.0)
Total Lymphocyte: 22.6 %
WBC: 7.4 10*3/uL (ref 3.8–10.8)

## 2022-01-26 LAB — HEPATITIS C ANTIBODY: Hepatitis C Ab: NONREACTIVE

## 2022-01-26 LAB — HEMOGLOBIN A1C
Hgb A1c MFr Bld: 12 % of total Hgb — ABNORMAL HIGH (ref <5.7)
Mean Plasma Glucose: 298 mg/dL
eAG (mmol/L): 16.5 mmol/L

## 2022-01-26 LAB — HIV ANTIBODY (ROUTINE TESTING W REFLEX): HIV 1&2 Ab, 4th Generation: NONREACTIVE

## 2022-02-04 DIAGNOSIS — I10 Essential (primary) hypertension: Secondary | ICD-10-CM | POA: Diagnosis not present

## 2022-02-04 DIAGNOSIS — G4733 Obstructive sleep apnea (adult) (pediatric): Secondary | ICD-10-CM | POA: Diagnosis not present

## 2022-02-04 DIAGNOSIS — J449 Chronic obstructive pulmonary disease, unspecified: Secondary | ICD-10-CM | POA: Diagnosis not present

## 2022-02-04 DIAGNOSIS — Z8673 Personal history of transient ischemic attack (TIA), and cerebral infarction without residual deficits: Secondary | ICD-10-CM | POA: Diagnosis not present

## 2022-02-12 ENCOUNTER — Encounter: Payer: Self-pay | Admitting: Student in an Organized Health Care Education/Training Program

## 2022-02-12 ENCOUNTER — Ambulatory Visit
Payer: 59 | Attending: Student in an Organized Health Care Education/Training Program | Admitting: Student in an Organized Health Care Education/Training Program

## 2022-02-12 VITALS — BP 166/99 | HR 92 | Temp 97.6°F | Resp 18 | Ht 66.0 in | Wt 222.0 lb

## 2022-02-12 DIAGNOSIS — G890 Central pain syndrome: Secondary | ICD-10-CM

## 2022-02-12 DIAGNOSIS — E1140 Type 2 diabetes mellitus with diabetic neuropathy, unspecified: Secondary | ICD-10-CM | POA: Diagnosis not present

## 2022-02-12 DIAGNOSIS — E1142 Type 2 diabetes mellitus with diabetic polyneuropathy: Secondary | ICD-10-CM | POA: Diagnosis not present

## 2022-02-12 DIAGNOSIS — G894 Chronic pain syndrome: Secondary | ICD-10-CM | POA: Diagnosis not present

## 2022-02-12 MED ORDER — GABAPENTIN 600 MG PO TABS: ORAL_TABLET | ORAL | 5 refills | Status: DC

## 2022-02-12 MED ORDER — AMITRIPTYLINE HCL 25 MG PO TABS: 50.0000 mg | ORAL_TABLET | Freq: Every day | ORAL | 5 refills | Status: DC

## 2022-02-12 NOTE — Progress Notes (Signed)
PROVIDER NOTE: Information contained herein reflects review and annotations entered in association with encounter. Interpretation of such information and data should be left to medically-trained personnel. Information provided to patient can be located elsewhere in the medical record under "Patient Instructions". Document created using STT-dictation technology, any transcriptional errors that may result from process are unintentional.    Patient: [name]  Service Category: E/M  Provider: Gillis Santa, MD  DOB: 21-Nov-1965  DOS: 02/12/2022  Specialty: Interventional Pain Management  MRN: [id number]  Setting: Ambulatory outpatient  PCP: Delsa Grana, [name]  Type: Established Patient    Referring Provider: Delsa Grana, [name]  Location: Office  Delivery: Face-to-face     HPI  [name], a 57 y.o. year old female, is here today because of her Chronic painful diabetic neuropathy ([place]) [E11.40]. Ms. Zentz primary complain today is Back Pain (low) and Arm Pain (bilateral) Last encounter: My last encounter with her was on 03/10/21 Pertinent problems: Ms. Mchatton has Dyslipidemia associated with type 2 diabetes mellitus ([place]); Chronic pain syndrome; Diabetic polyneuropathy associated with type 2 diabetes mellitus ([place]); and CAD (coronary artery disease) on their pertinent problem list. Pain Assessment: Severity of Chronic pain is reported as a 8 /10. Location: Back Lower/radiates down left leg to foot in the entire leg. Onset: More than a month ago. Quality: Sharp. Timing: Intermittent. Modifying factor(s):  [name] Vitals:  height is '5\' 6"'$  (1.676 m) and weight is 222 lb (100.7 kg). Her temperature is 97.6 F (36.4 C). Her blood pressure is 166/99 (abnormal) and her pulse is 92. Her respiration is 18 and oxygen saturation is 100%.   Reason for encounter: medication management.   Ms. Gaber follows up today for face-to-face medication management.  Since her last visit with me she was  admitted to the hospital after a syncopal fall in the setting of dehydration.  She had an EEG done at the hospital given her history of seizures which was negative.  She also has a history of stroke and continues on Plavix.  She complains of paresthesias of her left foot related to type 2 diabetes.  We discussed Qutenza treatment for this.   ROS  Constitutional: Denies any fever or chills Gastrointestinal: No reported hemesis, hematochezia, vomiting, or acute GI distress Musculoskeletal:  Neck pain, low back pain Neurological:  Burning and tingling of bilateral feet left greater than right  Medication Review  BLOOD GLUCOSE TEST STRIPS, COVID-19 At Home Antigen Test, DULoxetine, Dulaglutide, EPINEPHrine, Glucagon, Lancets, amitriptyline, baclofen, bisacodyl, blood glucose meter kit and supplies, butalbital-acetaminophen-caffeine, carvedilol, cholecalciferol, ciclopirox, cloNIDine, clopidogrel, cycloSPORINE, ezetimibe, fluticasone-salmeterol, furosemide, gabapentin, insulin aspart, insulin glargine, insulin lispro, levETIRAcetam, mirabegron ER, olmesartan, pantoprazole, polyethylene glycol, rosuvastatin, senna-docusate, and sodium phosphate  History Review  Allergy: Ms. Shampine is allergic to codeine, farxiga [dapagliflozin], and lactose intolerance (gi). Drug: Ms. Andon  reports no history of drug use. Alcohol:  reports current alcohol use of about 1.0 standard drink of alcohol per week. Tobacco:  reports that she quit smoking about 35 years ago. Her smoking use included cigarettes. She has a 15.00 pack-year smoking history. She has never used smokeless tobacco. Social: Ms. Farler  reports that she quit smoking about 35 years ago. Her smoking use included cigarettes. She has a 15.00 pack-year smoking history. She has never used smokeless tobacco. She reports current alcohol use of about 1.0 standard drink of alcohol per week. She reports that she does not use drugs. Medical:  has a past medical  history of Anxiety and  depression (09/13/2006), Arthritis, Asthma, COPD (chronic obstructive pulmonary disease) ([place]), Depression, Diabetes mellitus, Diabetic neuropathy, painful ([place]), Diabetic retinopathy ([place]), Diastolic dysfunction, Dilated aortic root ([place]), Dizziness, Epilepsy idiopathic petit mal ([place]), GERD (gastroesophageal reflux disease), Glaucoma, Headache(784.0), Heart murmur, History of stress test, blood clots, Hyperlipidemia, Hypertension, Legally blind, MIGRAINE HEADACHE (09/14/2006), Myocardial infarction [facility]), Nephrolithiasis, Pain (09/23/2011), Pneumonia, Pyelonephritis, SBO (small bowel obstruction) ([place]) (04/23/2021), Seizures ([place]), Sleep apnea, Small bowel obstruction ([place]) (05/18/2021), Stress incontinence, and Stroke ([place]). Surgical: Ms. Pevey  has a past surgical history that includes kidney stone removal; Brain hematoma evacuation; Shunt removal; Retinal detachment surgery (Left, 1990); Vaginal hysterectomy (1996); Incisional hernia repair (05/05/2011); Mass excision (05/05/2011); PCNL; Cystoscopy/retrograde/ureteroscopy (08/24/2011); Cystoscopy with ureteroscopy (09/24/2011); Cystoscopy w/ retrogrades (09/24/2011); RIGHT/LEFT HEART CATH AND CORONARY ANGIOGRAPHY (N/A, 04/28/2019); and laparotomy (N/A, 06/12/2021). Family: family history includes ADD / ADHD in her maternal grandfather; Birth defects in her daughter and maternal grandfather; Brain cancer in her maternal grandmother; Cancer in her mother; Cirrhosis in her maternal grandfather; Diabetes in her maternal grandfather; Hypertension in her daughter, maternal grandmother, and mother; Uterine cancer in her mother.  Laboratory Chemistry Profile   Renal Lab Results  Component Value Date   BUN 20 01/23/2022   CREATININE 0.95 01/23/2022   LABCREA 25 11/17/2021   BCR SEE NOTE: 01/23/2022   GFR 76.80 09/30/2020   GFRAA >60 05/30/2019   GFRNONAA 52 (L) 10/20/2021    Hepatic Lab Results  Component Value Date   AST 9 (L) 01/23/2022    ALT 15 01/23/2022   ALBUMIN 3.5 10/16/2021   ALKPHOS 77 10/16/2021   LIPASE 37 07/07/2021    Electrolytes Lab Results  Component Value Date   NA 135 01/23/2022   K 4.6 01/23/2022   CL 96 (L) 01/23/2022   CALCIUM 9.7 01/23/2022   MG 2.1 10/18/2021   PHOS 4.7 (H) 07/07/2021    Bone Lab Results  Component Value Date   VD25OH 33 11/17/2021    Inflammation (CRP: Acute Phase) (ESR: Chronic Phase) Lab Results  Component Value Date   CRP 16.7 (H) 06/14/2021   ESRSEDRATE 65 (H) 06/14/2021   LATICACIDVEN 1.2 06/23/2021         Note: Above Lab results reviewed.  Recent Imaging Review  DG Abd 1 View CLINICAL DATA:  Abdominal distension.  EXAM: ABDOMEN - 1 VIEW  COMPARISON:  CT, 10/15/2021.  FINDINGS: Normal bowel gas pattern. Specifically, no bowel dilation to suggest obstruction.  Soft tissues poorly defined. Intrarenal stones noted on the recent prior CT are not visualized radiographically.  No acute skeletal abnormality.  IMPRESSION: 1. No acute findings.  No evidence of bowel obstruction.  Electronically Signed   By: Lajean Manes M.D.   On: 10/18/2021 12:06 Note: Reviewed        Physical Exam  General appearance: Well nourished, well developed, and well hydrated. In no apparent acute distress Mental status: Alert, oriented x 3 (person, place, & time)       Respiratory: No evidence of acute respiratory distress Eyes: PERLA Vitals: BP (!) 166/99 Comment: Dr Holley Raring notified.  Instructed to f/u with PCP  Pulse 92   Temp 97.6 F (36.4 C)   Resp 18   Ht '5\' 6"'$  (1.676 m)   Wt 222 lb (100.7 kg)   LMP  (LMP Unknown)   SpO2 100%   BMI 35.83 kg/m  BMI: Estimated body mass index is 35.83 kg/m as calculated from the following:   Height as of this encounter: '5\' 6"'$  (1.676 m).  Weight as of this encounter: 222 lb (100.7 kg). Ideal: Ideal body weight: 59.3 kg (130 lb 11.7 oz) Adjusted ideal body weight: 75.9 kg (167 lb 3.8 oz)  Cervical Spine Area Exam  Skin  & Axial Inspection: No masses, redness, edema, swelling, or associated skin lesions Alignment: Symmetrical Functional ROM: Pain restricted ROM      Stability: No instability detected Muscle Tone/Strength: Functionally intact. No obvious neuro-muscular anomalies detected. Sensory (Neurological): Musculoskeletal pain pattern  Upper Extremity (UE) Exam    Side: Right upper extremity  Side: Left upper extremity  Skin & Extremity Inspection: Skin color, temperature, and hair growth are WNL. No peripheral edema or cyanosis. No masses, redness, swelling, asymmetry, or associated skin lesions. No contractures.  Skin & Extremity Inspection: Skin color, temperature, and hair growth are WNL. No peripheral edema or cyanosis. No masses, redness, swelling, asymmetry, or associated skin lesions. No contractures.  Functional ROM: Unrestricted ROM          Functional ROM: Unrestricted ROM          Muscle Tone/Strength: Functionally intact. No obvious neuro-muscular anomalies detected.  Muscle Tone/Strength: Functionally intact. No obvious neuro-muscular anomalies detected.  Sensory (Neurological): Neurogenic pain pattern          Sensory (Neurological): Neurogenic pain pattern           Lumbar Spine Area Exam  Skin & Axial Inspection: No masses, redness, or swelling Alignment: Symmetrical Functional ROM: Pain restricted ROM       Stability: No instability detected Muscle Tone/Strength: Functionally intact. No obvious neuro-muscular anomalies detected. Sensory (Neurological): Neurogenic pain pattern  Lower Extremity Exam    Side: Right lower extremity  Side: Left lower extremity  Stability: No instability observed          Stability: No instability observed          Skin & Extremity Inspection: Skin color, temperature, and hair growth are WNL. No peripheral edema or cyanosis. No masses, redness, swelling, asymmetry, or associated skin lesions. No contractures.  Skin & Extremity Inspection: Skin color,  temperature, and hair growth are WNL. No peripheral edema or cyanosis. No masses, redness, swelling, asymmetry, or associated skin lesions. No contractures.  Functional ROM: Unrestricted ROM                  Functional ROM: Unrestricted ROM                  Muscle Tone/Strength: Functionally intact. No obvious neuro-muscular anomalies detected.  Muscle Tone/Strength: Functionally intact. No obvious neuro-muscular anomalies detected.  Sensory (Neurological): Neurogenic pain pattern        Sensory (Neurological): Neurogenic pain pattern        DTR: Patellar: deferred today Achilles: deferred today Plantar: deferred today  DTR: Patellar: deferred today Achilles: deferred today Plantar: deferred today  Palpation: No palpable anomalies  Palpation: No palpable anomalies     Assessment   Status Diagnosis  Controlled Controlled Controlled 1. Chronic painful diabetic neuropathy ([place])   2. Chronic pain syndrome   3. Central pain syndrome   4. Diabetic polyneuropathy associated with type 2 diabetes mellitus ([place])       Plan of Care    [name] has a current medication list which includes the following long-term medication(s): duloxetine, ezetimibe, insulin aspart, insulin glargine, insulin lispro, levetiracetam, mirabegron er, olmesartan, pantoprazole, rosuvastatin, wixela inhub, amitriptyline, and gabapentin.  Pharmacotherapy (Medications Ordered): Meds ordered this encounter  Medications   amitriptyline (ELAVIL) 25 MG tablet  Sig: Take 2-3 tablets (50-75 mg total) by mouth at bedtime.    Dispense:  90 tablet    Refill:  5   gabapentin (NEURONTIN) 600 MG tablet    Sig: 600 mg qAM, 600 mg qPM, 1200 mg QHS    Dispense:  120 tablet    Refill:  5   Orders Placed This Encounter  Procedures   NEUROLYSIS    Please order Qutenza patches from pharmacy    Standing Status:   Future    Standing Expiration Date:   05/14/2022    Order Specific Question:   Where will this  procedure be performed?    Answer:   [facility]      Follow-up plan:   Return in about 4 weeks (around 03/12/2022) for Qutenza left foot.    Recent Visits No visits were found meeting these conditions. Showing recent visits within past 90 days and meeting all other requirements Today's Visits Date Type Provider Dept  02/12/22 Office Visit Gillis Santa, MD [facility]  Showing today's visits and meeting all other requirements Future Appointments No visits were found meeting these conditions. Showing future appointments within next 90 days and meeting all other requirements  I discussed the assessment and treatment plan with the patient. The patient was provided an opportunity to ask questions and all were answered. The patient agreed with the plan and demonstrated an understanding of the instructions.  Patient advised to call back or seek an in-person evaluation if the symptoms or condition worsens.  Duration of encounter: 80mnutes.  Note by: BGillis Santa MD Date: 02/12/2022; Time: 1:20 PM

## 2022-02-12 NOTE — Patient Instructions (Signed)
Capsaicin Patches What is this medication? CAPSAICIN (cap SAY sin) relieves minor pain in your muscles and joints. It works by making your skin feel warm or cool, which blocks pain signals going to the brain. This medicine may be used for other purposes; ask your health care provider or pharmacist if you have questions. COMMON BRAND NAME(S): Qutenza What should I tell my care team before I take this medication? They need to know if you have any of these conditions: Broken or irritated skin High blood pressure History of heart attack or stroke An unusual or allergic reaction to capsaicin, hot peppers, other medications, foods, dyes, or preservatives Pregnant or trying to get pregnant Breast-feeding How should I use this medication? This medication is for external use only. It is applied by your care team in a hospital or clinic setting. Talk to your care team about the use of this medication in children. Special care may be needed. Overdosage: If you think you have taken too much of this medicine contact a poison control center or emergency room at once. NOTE: This medicine is only for you. Do not share this medicine with others. What if I miss a dose? This does not apply. What may interact with this medication? Interactions are not expected. Do not use any other skin products on the affected area without asking your care team. This list may not describe all possible interactions. Give your health care provider a list of all the medicines, herbs, non-prescription drugs, or dietary supplements you use. Also tell them if you smoke, drink alcohol, or use illegal drugs. Some items may interact with your medicine. What should I watch for while using this medication? Your condition will be monitored carefully while you are receiving this medication. Your blood pressure may go up during the procedure. Do not touch the medication patch during treatment. This medication causes red, burning skin. You  may need pain medication for during and after the procedure. This medication can make you more sensitive to heat for a few days after treatment. Be careful in hot showers or baths. Keep out of the sun. Exercise may make the treated skin feel hotter. Tell your care team if your symptoms do not start to get better or if they get worse. What side effects may I notice from receiving this medication? Side effects that you should report to your care team as soon as possible: Allergic reactions--skin rash, itching, hives, swelling of the face, lips, tongue, or throat Side effects that usually do not require medical attention (report these to your care team if they continue or are bothersome): Mild skin irritation, redness, or dryness This list may not describe all possible side effects. Call your doctor for medical advice about side effects. You may report side effects to FDA at 1-800-FDA-1088. Where should I keep my medication? This medication is given in a hospital or clinic. It will not be stored at home. NOTE: This sheet is a summary. It may not cover all possible information. If you have questions about this medicine, talk to your doctor, pharmacist, or health care provider.  2023 Elsevier/Gold Standard (2020-09-04 00:00:00)

## 2022-02-12 NOTE — Progress Notes (Signed)
Safety precautions to be maintained throughout the outpatient stay will include: orient to surroundings, keep bed in low position, maintain call bell within reach at all times, provide assistance with transfer out of bed and ambulation.

## 2022-02-14 ENCOUNTER — Other Ambulatory Visit: Payer: Self-pay | Admitting: Family Medicine

## 2022-02-14 DIAGNOSIS — E1165 Type 2 diabetes mellitus with hyperglycemia: Secondary | ICD-10-CM

## 2022-02-14 DIAGNOSIS — E119 Type 2 diabetes mellitus without complications: Secondary | ICD-10-CM

## 2022-02-14 DIAGNOSIS — E162 Hypoglycemia, unspecified: Secondary | ICD-10-CM

## 2022-02-16 ENCOUNTER — Telehealth: Payer: Self-pay | Admitting: Family Medicine

## 2022-02-16 DIAGNOSIS — E119 Type 2 diabetes mellitus without complications: Secondary | ICD-10-CM

## 2022-02-16 DIAGNOSIS — E1165 Type 2 diabetes mellitus with hyperglycemia: Secondary | ICD-10-CM

## 2022-02-16 NOTE — Telephone Encounter (Signed)
Called pt to inform her per Junie Panning- she will need to contact Endocrinologist office. Pt stated back to me Kristeen Miss has been prescribing it which Erin see endo office has been managing it. She also mentioned pharmacy has been telling her she does not have refills on all medication that was sent last year worth 1 year supply which I called pharmacy and they stated she does have remaining refills, unsure the communication between her and pharmacy staff.   Called pt back to inform her refills will be ready for her only zetia is too soon to fill. She will need to contact endo office to get her diabetes medications.  Pt verbalized understanding.

## 2022-02-16 NOTE — Telephone Encounter (Signed)
Medication Refill - Medication: refill on insulin DC/// Humalog valves  She uses Humalog quick Pen   She needs a refill on these.  She said she needs orders sent in for everything she gets from [facility]. She said they say she has no refills on her medications.  Has the patient contacted their pharmacy? Yes.   (Agent: If no, request that the patient contact the pharmacy for the refill. If patient does not wish to contact the pharmacy document the reason why and proceed with request.) (Agent: If yes, when and what did the pharmacy advise?)  Preferred Pharmacy (with phone number or street name): [facility] [address] Has the patient been seen for an appointment in the last year OR does the patient have an upcoming appointment? Yes.    Agent: Please be advised that RX refills may take up to 3 business days. We ask that you follow-up with your pharmacy.

## 2022-02-18 ENCOUNTER — Other Ambulatory Visit: Payer: Self-pay | Admitting: Endocrinology

## 2022-02-18 ENCOUNTER — Other Ambulatory Visit: Payer: Self-pay | Admitting: Nurse Practitioner

## 2022-02-18 ENCOUNTER — Telehealth: Payer: Self-pay | Admitting: Endocrinology

## 2022-02-18 ENCOUNTER — Other Ambulatory Visit: Payer: Self-pay | Admitting: Family Medicine

## 2022-02-18 DIAGNOSIS — E1165 Type 2 diabetes mellitus with hyperglycemia: Secondary | ICD-10-CM

## 2022-02-18 DIAGNOSIS — E1159 Type 2 diabetes mellitus with other circulatory complications: Secondary | ICD-10-CM

## 2022-02-18 DIAGNOSIS — I10 Essential (primary) hypertension: Secondary | ICD-10-CM

## 2022-02-18 DIAGNOSIS — K219 Gastro-esophageal reflux disease without esophagitis: Secondary | ICD-10-CM

## 2022-02-18 NOTE — Telephone Encounter (Signed)
Done

## 2022-02-18 NOTE — Telephone Encounter (Signed)
Patient is calling saying that she had contacted her pharmacy,   [id number] [place](Ph: [phone]   And requested a refill on her Humalog.  Patient states that they got her prescription wrong.  Patient states that she uses the Humalog Quik Pen and not the Humalog vials and would like the correct prescription sent in

## 2022-02-19 NOTE — Telephone Encounter (Signed)
Requested medication (s) are due for refill today: -  Requested medication (s) are on the active medication list: yes  Last refill:  06/19/21 #30 3 RF  Future visit scheduled: no  Notes to clinic:  last note advised med was dc'd at discharge bit the last reorder was from hospital discharge   Requested Prescriptions  Pending Prescriptions Disp Refills   pantoprazole ([place]) 20 MG tablet [Pharmacy Med Name: PANTOPRAZOLE SOD DR 20 MG TAB] 90 tablet 1    Sig: TAKE [place]     Gastroenterology: Proton Pump Inhibitors Passed - 02/18/2022  6:55 PM      Passed - Valid encounter within last 12 months    Recent Outpatient Visits           3 weeks ago Uncontrolled type 2 diabetes mellitus with hyperglycemia [facility])   [facility] Delsa Grana, [name]   2 months ago Uncontrolled type 2 diabetes mellitus with hyperglycemia [facility])   [facility] Delsa Grana, [name]   2 months ago Uncontrolled type 2 diabetes mellitus with hyperglycemia [facility])   [facility] Delsa Grana, [name]   3 months ago Encounter for examination following treatment at [facility] Delsa Grana, [name]   5 months ago Erroneous encounter - disregard   [facility] Bo Merino, [name]       Future Appointments             In 3 weeks Elayne Snare, MD [facility]   In 1 month  [facility], [facility]

## 2022-02-25 ENCOUNTER — Encounter: Payer: Self-pay | Admitting: Student in an Organized Health Care Education/Training Program

## 2022-02-25 ENCOUNTER — Ambulatory Visit
Payer: 59 | Attending: Student in an Organized Health Care Education/Training Program | Admitting: Student in an Organized Health Care Education/Training Program

## 2022-02-25 VITALS — BP 183/110 | HR 87 | Temp 97.7°F | Resp 16 | Ht 66.5 in | Wt 222.0 lb

## 2022-02-25 DIAGNOSIS — E1142 Type 2 diabetes mellitus with diabetic polyneuropathy: Secondary | ICD-10-CM | POA: Diagnosis not present

## 2022-02-25 DIAGNOSIS — G894 Chronic pain syndrome: Secondary | ICD-10-CM | POA: Insufficient documentation

## 2022-02-25 DIAGNOSIS — E1140 Type 2 diabetes mellitus with diabetic neuropathy, unspecified: Secondary | ICD-10-CM | POA: Diagnosis not present

## 2022-02-25 MED ORDER — CAPSAICIN-CLEANSING GEL 8 % EX KIT
2.0000 | PACK | Freq: Once | CUTANEOUS | Status: AC
  Administered 2022-02-25: 2 via TOPICAL

## 2022-02-25 NOTE — Progress Notes (Signed)
PROVIDER NOTE: Interpretation of information contained herein should be left to medically-trained personnel. Specific patient instructions are provided elsewhere under "Patient Instructions" section of medical record. This document was created in part using STT-dictation technology, any transcriptional errors that may result from this process are unintentional.  Patient: [name] Type: Established DOB: 04-04-1965 MRN: [id number] PCP: Delsa Grana, [name]  Service: Procedure DOS: 02/25/2022 Setting: Ambulatory Location: Ambulatory outpatient facility Delivery: Face-to-face Provider: Gillis Santa, MD Specialty: Interventional Pain Management Specialty designation: 09 Location: Outpatient facility Ref. Prov.: Delsa Grana, [name]       Health-care intervention ([facility] )    Interventional Treatment:          Procedure: Qutenza Neurolysis #1  Laterality:  Left Area treated: Feet Imaging Guidance: None Anesthesia/analgesia/anxiolysis/sedation: None required Medication (Left): Qutenza patches Date: 02/25/2022 Performed by: Gillis Santa, MD 1. Chronic pain syndrome   2. Diabetic polyneuropathy associated with type 2 diabetes mellitus ([place])   3. Chronic painful diabetic neuropathy (HCC)    NAS-11 Pain score:   Pre-procedure: 8 /10   Post-procedure: 8 /10     Position / Prep / Materials:  Position: Supine  Materials: Qutenza Kit  Pre-op H&P Assessment:  [name] is a 57 y.o. (year old), female patient, seen today for interventional treatment. She  has a past surgical history that includes kidney stone removal; Brain hematoma evacuation; Shunt removal; Retinal detachment surgery (Left, 1990); Vaginal hysterectomy (1996); Incisional hernia repair (05/05/2011); Mass excision (05/05/2011); PCNL; Cystoscopy/retrograde/ureteroscopy (08/24/2011); Cystoscopy with ureteroscopy (09/24/2011); Cystoscopy w/ retrogrades (09/24/2011); RIGHT/LEFT HEART CATH AND CORONARY  ANGIOGRAPHY (N/A, 04/28/2019); and laparotomy (N/A, 06/12/2021). [name] has a current medication list which includes the following prescription(s): amitriptyline, baclofen, bisacodyl, blood glucose meter kit and supplies, butalbital-acetaminophen-caffeine, carvedilol, cholecalciferol, ciclopirox, clonidine, clopidogrel, cyclosporine, trulicity, duloxetine, epinephrine, ezetimibe, furosemide, gabapentin, insulin lispro, lancets, levetiracetam, mirabegron er, olmesartan, pantoprazole, polyethylene glycol, rosuvastatin, senna-docusate, sodium phosphate, wixela inhub, covid-19 at home antigen test, gvoke hypopen 2-pack, blood glucose test strips, insulin aspart, and insulin glargine, and the following Facility-Administered Medications: capsaicin topical system. Her primarily concern today is the No chief complaint on file.  Initial Vital Signs:  Pulse/HCG Rate: 87  Temp: 97.7 F (36.5 C) Resp: 16 BP: (!) 183/110 (takes BP med) SpO2: 100 %  BMI: Estimated body mass index is 35.29 kg/m as calculated from the following:   Height as of this encounter: 5' 6.5" (1.689 m).   Weight as of this encounter: 222 lb (100.7 kg).  Risk Assessment: Allergies: Reviewed. She is allergic to codeine, farxiga [dapagliflozin], and lactose intolerance (gi).  Allergy Precautions: None required Coagulopathies: Reviewed. None identified.  Blood-thinner therapy: None at this time Active Infection(s): Reviewed. None identified. [name] is afebrile  Site Confirmation: [name] was asked to confirm the procedure and laterality before marking the site Procedure checklist: Completed Consent: Before the procedure and under the influence of no sedative(s), amnesic(s), or anxiolytics, the patient was informed of the treatment options, risks and possible complications. To fulfill our ethical and legal obligations, as recommended by the American Medical Association's Code of Ethics, I have informed the patient of my clinical  impression; the nature and purpose of the treatment or procedure; the risks, benefits, and possible complications of the intervention; the alternatives, including doing nothing; the risk(s) and benefit(s) of the alternative treatment(s) or procedure(s); and the risk(s) and benefit(s) of doing nothing. The patient was provided information about the general risks and possible complications associated with the procedure. These may  include, but are not limited to: failure to achieve desired goals, infection, bleeding, organ or nerve damage, allergic reactions, paralysis, and death. In addition, the patient was informed of those risks and complications associated to the procedure, such as failure to decrease pain; infection; bleeding; organ or nerve damage with subsequent damage to sensory, motor, and/or autonomic systems, resulting in permanent pain, numbness, and/or weakness of one or several areas of the body; allergic reactions; (i.e.: anaphylactic reaction); and/or death. Furthermore, the patient was informed of those risks and complications associated with the medications. These include, but are not limited to: allergic reactions (i.e.: anaphylactic or anaphylactoid reaction(s)); adrenal axis suppression; blood sugar elevation that in diabetics may result in ketoacidosis or comma; water retention that in patients with history of congestive heart failure may result in shortness of breath, pulmonary edema, and decompensation with resultant heart failure; weight gain; swelling or edema; medication-induced neural toxicity; particulate matter embolism and blood vessel occlusion with resultant organ, and/or nervous system infarction; and/or aseptic necrosis of one or more joints. Finally, the patient was informed that Medicine is not an exact science; therefore, there is also the possibility of unforeseen or unpredictable risks and/or possible complications that may result in a catastrophic outcome. The patient  indicated having understood very clearly. We have given the patient no guarantees and we have made no promises. Enough time was given to the patient to ask questions, all of which were answered to the patient's satisfaction. Ms. Kinkade has indicated that she wanted to continue with the procedure. Attestation: I, the ordering provider, attest that I have discussed with the patient the benefits, risks, side-effects, alternatives, likelihood of achieving goals, and potential problems during recovery for the procedure that I have provided informed consent. Date  Time: 02/25/2022 12:31 PM  Pre-Procedure Preparation:  Monitoring: As per clinic protocol. Respiration, ETCO2, SpO2, BP, heart rate and rhythm monitor placed and checked for adequate function Safety Precautions: Patient was assessed for positional comfort and pressure points before starting the procedure. Time-out: I initiated and conducted the "Time-out" before starting the procedure, as per protocol. The patient was asked to participate by confirming the accuracy of the "Time Out" information. Verification of the correct person, site, and procedure were performed and confirmed by me, the nursing staff, and the patient. "Time-out" conducted as per Joint Commission's Universal Protocol (UP.01.01.01). Time: 1300  Description/Narrative of Procedure:          Region: Distal lower extremity Target Area: Sensory peripheral nerves affected by diabetic peripheral neuropathy Site: left foot Approach: Percutaneous  No./Series: Not applicable  Type: Percutaneous  Purpose: Therapeutic   Description of the Procedure: Protocol guidelines were followed. The patient was assisted into a comfortable position.  Informed consent was obtained in the patient monitored in the usual manner.  All questions were answered prior to the procedure.  They Qutenza patches were applied to the affected area and then covered with the wrap.  The Patient was kept under  observation until the treatment was completed.  The patches were removed and the treated area was inspected.  Vitals:   02/25/22 1242  BP: (!) 183/110  Pulse: 87  Resp: 16  Temp: 97.7 F (36.5 C)  SpO2: 100%  Weight: 222 lb (100.7 kg)  Height: 5' 6.5" (1.689 m)     Start Time: 1305 hrs. End Time:   hrs. Imaging Guidance:          Type of Imaging Technique: None used Indication(s): N/A Exposure Time: No patient exposure  Contrast: None used. Fluoroscopic Guidance: N/A Ultrasound Guidance: N/A Interpretation: N/A  Post-operative Assessment:  Post-procedure Vital Signs:  Pulse/HCG Rate: 87  Temp: 97.7 F (36.5 C) Resp: 16 BP: (!) 183/110 (takes BP med) SpO2: 100 %  EBL: None  Complications: No immediate post-treatment complications observed by team, or reported by patient.  Note: The patient tolerated the entire procedure well. A repeat set of vitals were taken after the procedure and the patient was kept under observation following institutional policy, for this type of procedure. Post-procedural neurological assessment was performed, showing return to baseline, prior to discharge. The patient was provided with post-procedure discharge instructions, including a section on how to identify potential problems. Should any problems arise concerning this procedure, the patient was given instructions to immediately contact us, at any time, without hesitation. In any case, we plan to contact the patient by telephone for a follow-up status report regarding this interventional procedure.  Comments:  No additional relevant information.  Plan of Care (POC)  Orders:  No orders of the defined types were placed in this encounter.   Medications ordered for procedure: Meds ordered this encounter  Medications   capsaicin topical system 8 % patch 2 patch   Medications administered: [facility] "Cherrie Distance" had no medications administered during this visit.  See the medical record  for exact dosing, route, and time of administration.  Follow-up plan:   Return in about 10 weeks (around 05/06/2022) for Post Procedure Evaluation, in person.     Recent Visits Date Type Provider Dept  02/12/22 Office Visit Gillis Santa, MD [facility]  Showing recent visits within past 90 days and meeting all other requirements Today's Visits Date Type Provider Dept  02/25/22 Procedure visit Gillis Santa, MD [facility]  Showing today's visits and meeting all other requirements Future Appointments No visits were found meeting these conditions. Showing future appointments within next 90 days and meeting all other requirements  Disposition: Discharge home  Discharge (Date  Time): 02/25/2022;   hrs.   Primary Care Physician: Delsa Grana, [name] Location: [facility] Facility Note by: Gillis Santa, MD Date: 02/25/2022; Time: 1:43 PM  Disclaimer:  Medicine is not an exact science. The only guarantee in medicine is that nothing is guaranteed. It is important to note that the decision to proceed with this intervention was based on the information collected from the patient. The Data and conclusions were drawn from the patient's questionnaire, the interview, and the physical examination. Because the information was provided in large part by the patient, it cannot be guaranteed that it has not been purposely or unconsciously manipulated. Every effort has been made to obtain as much relevant data as possible for this evaluation. It is important to note that the conclusions that lead to this procedure are derived in large part from the available data. Always take into account that the treatment will also be dependent on availability of resources and existing treatment guidelines, considered by other Pain Management Practitioners as being common knowledge and practice, at the time of the intervention. For Medico-Legal purposes, it is also important to point out  that variation in procedural techniques and pharmacological choices are the acceptable norm. The indications, contraindications, technique, and results of the above procedure should only be interpreted and judged by a Board-Certified Interventional Pain Specialist with extensive familiarity and expertise in the same exact procedure and technique.

## 2022-02-25 NOTE — Progress Notes (Signed)
Safety precautions to be maintained throughout the outpatient stay will include: orient to surroundings, keep bed in low position, maintain call bell within reach at all times, provide assistance with transfer out of bed and ambulation.  

## 2022-02-25 NOTE — Patient Instructions (Signed)

## 2022-02-26 ENCOUNTER — Telehealth: Payer: Self-pay

## 2022-02-26 DIAGNOSIS — G4733 Obstructive sleep apnea (adult) (pediatric): Secondary | ICD-10-CM | POA: Diagnosis not present

## 2022-02-26 NOTE — Telephone Encounter (Signed)
Post procedure follow up.  Patient states she is doing good.  

## 2022-03-05 DIAGNOSIS — G4733 Obstructive sleep apnea (adult) (pediatric): Secondary | ICD-10-CM | POA: Diagnosis not present

## 2022-03-05 DIAGNOSIS — I10 Essential (primary) hypertension: Secondary | ICD-10-CM | POA: Diagnosis not present

## 2022-03-05 DIAGNOSIS — Z8673 Personal history of transient ischemic attack (TIA), and cerebral infarction without residual deficits: Secondary | ICD-10-CM | POA: Diagnosis not present

## 2022-03-05 DIAGNOSIS — J449 Chronic obstructive pulmonary disease, unspecified: Secondary | ICD-10-CM | POA: Diagnosis not present

## 2022-03-13 ENCOUNTER — Ambulatory Visit (INDEPENDENT_AMBULATORY_CARE_PROVIDER_SITE_OTHER): Payer: 59 | Admitting: Endocrinology

## 2022-03-13 ENCOUNTER — Encounter: Payer: Self-pay | Admitting: Endocrinology

## 2022-03-13 VITALS — BP 150/90 | HR 82 | Ht 66.5 in | Wt 235.0 lb

## 2022-03-13 DIAGNOSIS — I10 Essential (primary) hypertension: Secondary | ICD-10-CM | POA: Diagnosis not present

## 2022-03-13 DIAGNOSIS — E1165 Type 2 diabetes mellitus with hyperglycemia: Secondary | ICD-10-CM

## 2022-03-13 DIAGNOSIS — Z794 Long term (current) use of insulin: Secondary | ICD-10-CM

## 2022-03-13 MED ORDER — OMNIPOD 5 DEXG7G6 PODS GEN 5 MISC: 1.0000 | 3 refills | Status: DC

## 2022-03-13 MED ORDER — DEXCOM G6 SENSOR MISC: 3 refills | Status: DC

## 2022-03-13 MED ORDER — DEXCOM G6 TRANSMITTER MISC: 1.0000 | Freq: Once | 1 refills | Status: AC

## 2022-03-13 MED ORDER — OMNIPOD 5 DEXG7G6 INTRO GEN 5 KIT: 1.0000 | PACK | Freq: Once | 0 refills | Status: AC

## 2022-03-13 MED ORDER — TOUJEO MAX SOLOSTAR 300 UNIT/ML ~~LOC~~ SOPN: PEN_INJECTOR | SUBCUTANEOUS | 3 refills | Status: DC

## 2022-03-13 NOTE — Progress Notes (Signed)
Patient ID: [name], female   DOB: [birth year], 57 y.o.   MRN: [id number]          Reason for Appointment: Follow-up for Type 2 Diabetes   History of Present Illness:          Date of diagnosis of type 2 diabetes mellitus:   1989      Background history:   She thinks she was initially treated with diet alone and subsequently given metformin With metformin she had nausea and diarrhea but she does not know if she was treated with any other medications until she was started on insulin in 2003.  Also was on glipizide in 2015 Most likely she was on Levemir or Lantus insulin for quite some time She was then switched to Toujeo in 2016 and although she was initially prescribed 90 units twice a day this was subsequently lowered  Her A1c has been occasionally between 7.2 and 7.5 but has been consistently over 9% since 2018   Recent history:     PREVIOUS OMNIPOD PUMP settings with Humalog U-200:  Basal settings: 12 AM = 2.6, 3 AM = 2.4, 9 AM = 2.7, 3 PM = 3.0 Sensitivity 1: 60 and target 140  Max bolus 6 units Active insulin time 6 hours  INSULIN regimen currently: LANTUS 31 twice daily, NovoLog 3-15 units before meals based on blood sugar      Non-insulin hypoglycemic drugs the patient is taking are: Trulicity 99991111 mg weekly, Farxiga 10 mg daily  Her A1c is last 12%  Current management, blood sugar patterns and problems identified:  She has not been seen in the office since 06/2021 She was in the hospital in September again and her insulin pump was discontinued She was also told not to use the Dexcom although for some reason it may have been reading was falsely low at that time She states that if she has blood sugars get below 200 she will feel weak and may even have seizures if they are lower She is only using a so-called sliding scale insulin at mealtimes based on her blood sugar ranging from 3-15 units Last evening even though her blood sugar was 295 before dinner she  did not eat meal and still took 11 units Also is taking Lantus twice a day No HYPOGLYCEMIA recently With her monitoring on Accu-Chek only blood sugars are only 24% within target Most of her blood sugars are above target and only occasionally below 180 in the mornings or midday She says she has taken her Trulicity regularly Although [place]ade was reportedly stopped last year she thinks she is still taking it She is not doing any exercise   Side effects from medications have been:metformin caused GI side effects   CGM data PREVIOUSLY  In general blood sugar data may be coming complete on some days because lack of monitoring late evenings and afternoons HYPERGLYCEMIC spikes are seen periodically midday and afternoon as well as sometimes late at night OVERNIGHT blood sugars are usually starting of on an average about 200 and generally declining till 8-9 AM but only occasionally continued to be high.  No hypoglycemia overnight HIGHEST blood sugars overall are in 4-5 PM but lowest blood sugars from 8-10 AM POSTPRANDIAL readings are variable with occasional significant rise in blood sugars midday with persistent continued hyperglycemia After dinnertime blood sugars are not clearly high but usually blood sugars are higher late at night after 9-10 PM No hypoglycemia    CGM use % of time  74  2-week average/GV 170/27  Time in range 53  % Time Above 180 43+4  % Time above 250   % Time Below 70      PRE-MEAL Fasting Lunch Dinner Bedtime Overall  Glucose range:       Averages: 129 188  207  197    POST-MEAL PC Breakfast PC Lunch PC Dinner  Glucose range:       Meal times are: Bfst 7-8 AM, 1-2 PM and 6 PM                Dietician visit, most recent: 9/18 CDE visit: 01/2020 for pump start  Weight history: Previous range 219-230  Wt Readings from Last 3 Encounters:  03/13/22 235 lb (106.6 kg)  02/25/22 222 lb (100.7 kg)  02/12/22 222 lb (100.7 kg)    Glycemic control:    Lab  Results  Component Value Date   HGBA1C 12.0 (H) 01/23/2022   HGBA1C 13.7 (H) 10/15/2021   HGBA1C 8 07/26/2021   Lab Results  Component Value Date   MICROALBUR 0.9 11/17/2021   LDLCALC 67 06/02/2021   CREATININE 0.95 01/23/2022   Lab Results  Component Value Date   MICRALBCREAT 36 (H) 11/17/2021    Lab Results  Component Value Date   FRUCTOSAMINE 255 04/09/2020   FRUCTOSAMINE 298 (H) 01/30/2020   FRUCTOSAMINE CANCELED 07/26/2015      Allergies as of 03/13/2022       Reactions   Codeine Swelling, Other (See Comments)   Swelling and burning of mouth (inside)   [place] [dapagliflozin] Other (See Comments)   Recurrent UTI with farxiga   Lactose Intolerance (gi) Nausea And Vomiting        Medication List        Accurate as of March 13, 2022  9:35 AM. If you have any questions, ask your nurse or doctor.          STOP taking these medications    insulin aspart 100 UNIT/ML injection Commonly known as: novoLOG Stopped by: Elayne Snare, MD   insulin glargine 100 UNIT/ML Solostar Pen Commonly known as: LANTUS Replaced by: Toujeo Max SoloStar 300 UNIT/ML Solostar Pen Stopped by: Elayne Snare, MD       TAKE these medications    amitriptyline 25 MG tablet Commonly known as: ELAVIL Take 2-3 tablets (50-75 mg total) by mouth at bedtime.   baclofen 10 MG tablet Commonly known as: LIORESAL TAKE 1 TABLET BY MOUTH THREE TIMES A DAY AS NEEDED FOR MUSCLE SPASMS   bisacodyl 5 MG EC tablet Commonly known as: DULCOLAX Take 1 tablet (5 mg total) by mouth daily as needed for moderate constipation.   blood glucose meter kit and supplies Kit Dispense based on patient and insurance preference. Use up to four times daily as directed. (FOR E11.65 Z79.4).   BLOOD GLUCOSE TEST STRIPS Strp Use as directed to monitor blood sugar up to 4x a day FOR E11.65 Z79.4   butalbital-acetaminophen-caffeine 50-325-40 MG tablet Commonly known as: FIORICET Take 1-2 tablets by mouth  every 6 (six) hours as needed for headache.   carvedilol 25 MG tablet Commonly known as: COREG Take 25 mg by mouth 2 (two) times daily.   cholecalciferol 25 MCG (1000 UNIT) tablet Commonly known as: VITAMIN D3 Take 1,000 Units by mouth daily.   ciclopirox 8 % solution Commonly known as: PENLAC Apply 1 Application topically at bedtime.   cloNIDine 0.1 MG tablet Commonly known as: CATAPRES Take 0.1 mg by mouth daily  as needed (For blood pressure overm160/90).   clopidogrel 75 MG tablet Commonly known as: PLAVIX Take 1 tablet (75 mg total) by mouth daily.   COVID-19 At Home Antigen Test Kit Use as directed.   cycloSPORINE 0.05 % ophthalmic emulsion Commonly known as: RESTASIS Place 2 drops into both eyes 2 (two) times daily.   Dexcom G6 Sensor Misc Use to monitor blood sugar, change after 10 days Started by: Elayne Snare, MD   Dexcom G6 Transmitter Misc 1 Device by Does not apply route once for 1 dose. Started by: Elayne Snare, MD   DULoxetine 60 MG capsule Commonly known as: Cymbalta Take 1 capsule (60 mg total) by mouth daily.   EPINEPHrine 0.3 mg/0.3 mL Soaj injection Commonly known as: EPI-PEN Inject 0.3 mg into the muscle as needed for anaphylaxis.   ezetimibe 10 MG tablet Commonly known as: ZETIA Take 1 tablet (10 mg total) by mouth daily.   furosemide 20 MG tablet Commonly known as: LASIX Take 20 mg by mouth.   gabapentin 600 MG tablet Commonly known as: Neurontin 600 mg qAM, 600 mg qPM, 1200 mg QHS   Gvoke HypoPen 2-Pack 1 MG/0.2ML Soaj Generic drug: Glucagon Inject 1 mg into the skin once as needed for up to 1 dose (hypoglycemia). Only for severe low blood sugar not responding to drinking juice/glucose tablets, when CBG is <70   insulin lispro 100 UNIT/ML KwikPen Commonly known as: HumaLOG KwikPen Max daily dose 160 units   Lancets Misc Use as directed to monitor blood sugar up to 4x a day FOR E11.65 Z79.4   levETIRAcetam 500 MG tablet Commonly  known as: KEPPRA Take 500-750 mg by mouth See admin instructions. Take 1 tablet ('500mg'$ ) by mouth every morning and take 1 tablets by mouth ('750mg'$ ) by mouth every night   mirabegron ER 50 MG Tb24 tablet Commonly known as: Myrbetriq Take 1 tablet (50 mg total) by mouth daily.   olmesartan 40 MG tablet Commonly known as: BENICAR Take 1 tablet (40 mg total) by mouth daily.   pantoprazole 40 MG tablet Commonly known as: PROTONIX Take 1 tablet (40 mg total) by mouth daily.   polyethylene glycol 17 g packet Commonly known as: MIRALAX / GLYCOLAX Take 17 g by mouth daily as needed for mild constipation.   rosuvastatin 10 MG tablet Commonly known as: CRESTOR Take 1 tablet (10 mg total) by mouth daily.   senna-docusate 8.6-50 MG tablet Commonly known as: Senokot-S Take 1 tablet by mouth at bedtime as needed for mild constipation.   sodium phosphate 7-19 GM/118ML Enem Place 133 mLs (1 enema total) rectally daily as needed for severe constipation.   Toujeo Max SoloStar 300 UNIT/ML Solostar Pen Generic drug: insulin glargine (2 Unit Dial) 70 units in am Replaces: insulin glargine 100 UNIT/ML Solostar Pen Started by: Elayne Snare, MD   Trulicity 4.5 0000000 Sopn Generic drug: Dulaglutide Inject 4.5 mg as directed once a week.   Wixela Inhub 250-50 MCG/ACT Aepb Generic drug: fluticasone-salmeterol Inhale 1 puff into the lungs 2 (two) times daily.        Allergies:  Allergies  Allergen Reactions   Codeine Swelling and Other (See Comments)    Swelling and burning of mouth (inside)   [place] [Dapagliflozin] Other (See Comments)    Recurrent UTI with farxiga   Lactose Intolerance (Gi) Nausea And Vomiting    Past Medical History:  Diagnosis Date   Anxiety and depression 09/13/2006   Qualifier: Diagnosis of  By: Hassell Done [name], Tori Milks  Arthritis    joint pain    Asthma    COPD (chronic obstructive pulmonary disease) (HCC)    chronic bronchitis    Depression    Diabetes  mellitus    since age 32; type 2 IDDM   Diabetic neuropathy, painful ([place])    FEET AND HANDS   Diabetic retinopathy ([place])    Diastolic dysfunction    a.  Echo 11/17: EF 60-65%, mild LVH, no RWMA, Gr1DD, mild AI, dilated aortic root measuring 38 mm, mildly dilated ascending aorta   Dilated aortic root (HCC)    71m by echo 11/2015   Dizziness    secondary to diabetes and hypertension    Epilepsy idiopathic petit mal ([facility]    last seizure 2012;controlled w/ topomax   GERD (gastroesophageal reflux disease)    Glaucoma    NOT ON ANY EYE DROPS    Headache(784.0)    migraines    Heart murmur    born with    History of stress test    a. 11/17: Normal perfusion, EF 53%, normal study   Hx of blood clots    hematomas removed from left side of brain from [id number]    Hyperlipidemia    Hypertension    Legally blind    left eye    MIGRAINE HEADACHE 09/14/2006   Qualifier: Diagnosis of  By: MarHassell DoneP, [place]     [facility]  Patient reported history of without objective documentation   Nephrolithiasis    frequent urination , urination at nite  PT SEEN IN ER 09/22/11 FOR BACK AND RT SIDED PAIN--HAS KNOWN STONE RT URETER AND UA IN ER SHOWED UTI   Pain 09/23/2011   LOWER BACK AND RIGHT SIDE--PT HAS RIGHT URETERAL STONE   Pneumonia    hx of 2009   Pyelonephritis    SBO (small bowel obstruction) ([facility] [place]/05/2021   Seizures ([facility]  [facility] ap[facility] ; mild   Small bowel obstruction ([facility]30[place]2023   Stress incontinence    Stroke ([facility]  last 2003  RESIDUAL LEFT LEG WEAKNESS--NO OTHER RESIDUAL PROBLEMS    Past Surgical History:  Procedure Laterality Date   BRAIN HEMATOMA EVACUATION     five procedures total, first procedure when 10 88 monthsd, last at 4 y70ars of age.   CYSTOSCOPY W/ RETROGRADES  09/24/2011   Procedure: CYSTOSCOPY WITH RETROGRADE PYELOGRAM;  Surgeon: JohMalka SoD;  [facility]: [facility];   Service: Urology;  Laterality: Right;   CYSTOSCOPY WITH URETEROSCOPY  09/24/2011   Procedure: CYSTOSCOPY WITH URETEROSCOPY;  Surgeon: JohMalka SoD;  Location: [facility];  Service: Urology;  Laterality: Right;  Balloon dilation right ureter    CYSTOSCOPY/RETROGRADE/URETEROSCOPY  08/24/2011   Procedure: CYSTOSCOPY/RETROGRADE/URETEROSCOPY;  Surgeon: DanMolli HazardD;  Location: [facility];  Service: Urology;  Laterality: Right;  Cysto, Right retrograde Pyelogram, right stent placement.    INCISIONAL HERNIA REPAIR  05/05/2011   Procedure: LAPAROSCOPIC INCISIONAL HERNIA;  Surgeon: MatRolm BookbinderD;  [facility];  Laterality: N/A;   kidney stone removal     LAPAROTOMY N/A 06/12/2021   Procedure: EXPLORATORY LAPAROTOMY, LYSIS OF ADHESIONS;  Surgeon: PisOlean ReeD;  Location: [facility];  Service: General;  Laterality: N/A;   MASS EXCISION  05/05/2011   Procedure: EXCISION MASS;  Surgeon: MatRolm BookbinderD;  [facility];  Laterality: Right;   [facility]     RETINAL DETACHMENT SURGERY Left 1990   RIGHT/LEFT HEART CATH AND CORONARY ANGIOGRAPHY N/A 04/28/2019   Procedure: RIGHT/LEFT HEART CATH AND CORONARY ANGIOGRAPHY;  Surgeon: Yolonda Kida, MD;  Location: [facility];  Service: Cardiovascular;  Laterality: N/A;   SHUNT REMOVAL     shunt inserted at age 62 removed at age 89    [place]    Family History  Problem Relation Age of Onset   Uterine cancer Mother    Hypertension Mother    Cancer Mother    Brain cancer Maternal Grandmother    Hypertension Maternal Grandmother    Birth defects Daughter    Hypertension Daughter    Cirrhosis Maternal Grandfather    ADD / ADHD Maternal Grandfather    Birth defects Maternal Grandfather    Diabetes Maternal Grandfather    Anesthesia problems Neg Hx    Colon cancer Neg Hx    Esophageal cancer Neg Hx    Pancreatic cancer Neg Hx     Social History:  reports that she quit  smoking about 35 years ago. Her smoking use included cigarettes. She has a 15.00 pack-year smoking history. She has never used smokeless tobacco. She reports current alcohol use of about 1.0 standard drink of alcohol per week. She reports that she does not use drugs.   Review of Systems   Lipid history: Is being treated with simvastatin 40 mg daily    Lab Results  Component Value Date   CHOL 128 06/02/2021   HDL 41 (L) 06/02/2021   LDLCALC 67 06/02/2021   TRIG 71 06/19/2021   CHOLHDL 3.1 06/02/2021           Hypertension: She has had severe hypertension  This has been managed by her cardiologist on a regular basis   She does have an adrenal adenoma  Not sure if she has had evaluation for this elsewhere Previously in the hospital her aldosterone level was normal  BP Readings from Last 3 Encounters:  03/13/22 (!) 150/90  02/25/22 (!) 183/110  02/12/22 (!) 166/99   Hypokalemia: She has been given  25 mg spironolactone for history of hypokalemia and no potassium supplements  Lab Results  Component Value Date   K 4.6 01/23/2022    Diabetic complications: History of neuropathy, microalbuminuria and background retinopathy  She will occasionally take prednisone for her asthma  LABS:    Physical Examination:  BP (!) 150/90 (BP Location: Left Arm, Patient Position: Sitting, Cuff Size: Normal)   Pulse 82   Ht 5' 6.5" (1.689 m)   Wt 235 lb (106.6 kg)   LMP  (LMP Unknown)   SpO2 97%   BMI 37.36 kg/m       ASSESSMENT:  Diabetes type 2, uncontrolled, insulin-dependent  See history of present illness for detailed discussion of current diabetes management, blood sugar patterns and problems identified     Her A1c is last 12%  She is on insulin with Lantus and Humalog Continues to be on Trulicity 4.5 mg weekly  Her blood sugars on her Accu-Chek monitoring are significantly high and averaging overall 203 She refuses to consider bringing her blood sugars below 200 as  she thinks she has symptoms with this Her discharge has been mildly increased but not fluctuating as much and generally averaging close to 200 with stable readings day and night Surprisingly not getting much postprandial hyperglycemia even with not getting boluses as discussed above May  be benefiting from Trulicity also, generally eating small portions  She is not adjusting her mealtime coverage based on what she is eating but only on her blood sugar level   Hypertension: Generally not controlled, to follow-up with her cardiologist  Renal function has been normal as of last month  PLAN:    Start HUMALOG coverage consistently for both meal coverage as well as correction doses as follows  Humalog take between 4 to 6 units to cover the food, 4 units for small meals and 6 units for full meals Additionally take Humalog if blood sugars are more than 150 as follows 150-200: Take 4 units 200-250: Take 6 units Over 300: Take 10 units Discussed that more likely she will need higher doses to cover her meals but we will see what her patterns are  Take 70 units Toujeo insulin in the morning and adjust every 3 days to keep blood sugars between 180-220 For now she can use an adjustment factor of 4 units    Discussed need to be getting the OmniPod 5 pump and will see if this can be approved  In the meantime start back on Dexcom sensor for monitoring This will help also watch her blood sugars closer including overnight Follow-up in 1 month  Need to see if she is taking [place] which is not on her record  Patient Instructions  Humalog take between 4 to 6 units to cover the food, 4 units for small meals and 6 units for full meals Additionally take Humalog if blood sugars are more than 150 as follows 150-200: Take 4 units 200-250: Take 6 units Over 300: Take 10 units  Take Toujeo insulin in the morning and adjust every 3 days to keep blood sugars between 180-220     Elayne Snare 03/13/2022,  9:35 AM   Note: This office note was prepared with Dragon voice recognition system technology. Any transcriptional errors that result from this process are unintentional.

## 2022-03-13 NOTE — Patient Instructions (Signed)
Humalog take between 4 to 6 units to cover the food, 4 units for small meals and 6 units for full meals Additionally take Humalog if blood sugars are more than 150 as follows 150-200: Take 4 units 200-250: Take 6 units Over 300: Take 10 units  Take Toujeo insulin in the morning and adjust every 3 days to keep blood sugars between 180-220

## 2022-03-16 ENCOUNTER — Other Ambulatory Visit: Payer: Self-pay | Admitting: Endocrinology

## 2022-03-17 ENCOUNTER — Telehealth: Payer: Self-pay

## 2022-03-17 ENCOUNTER — Telehealth: Payer: Self-pay | Admitting: Endocrinology

## 2022-03-17 NOTE — Telephone Encounter (Signed)
Med change request from Lantus: Alternative Requested:THE PRESCRIBED MEDICATION IS NOT COVERED BY INSURANCE. PLEASE CONSIDER CHANGING TO ONE OF THE SUGGESTED COVERED ALTERNATIVES.   All Pharmacy Suggested Alternatives:  insulin glargine (LANTUS SOLOSTAR) 100 UNIT/ML Solostar Pen insulin glargine, 1 Unit Dial, (TOUJEO SOLOSTAR) 300 UNIT/ML Solostar Pen insulin degludec (TRESIBA FLEXTOUCH) 100 UNIT/ML FlexTouch Pen insulin detemir (LEVEMIR FLEXTOUCH) 100 UNIT/ML FlexPen

## 2022-03-17 NOTE — Telephone Encounter (Signed)
Prescription for [name] has been sent on 2/23, please check the status of this.  Also give her the phone number for [place] to call to check on the status of her OmniPod pump

## 2022-03-20 NOTE — Telephone Encounter (Signed)
Pt states that her Toujeo was delivered. Also provided pt with the phone number to [facility]

## 2022-03-23 ENCOUNTER — Other Ambulatory Visit: Payer: Self-pay | Admitting: Endocrinology

## 2022-03-23 DIAGNOSIS — E1165 Type 2 diabetes mellitus with hyperglycemia: Secondary | ICD-10-CM

## 2022-03-24 ENCOUNTER — Ambulatory Visit: Payer: Medicare Other

## 2022-03-26 ENCOUNTER — Ambulatory Visit (INDEPENDENT_AMBULATORY_CARE_PROVIDER_SITE_OTHER): Payer: 59

## 2022-03-26 VITALS — Ht 66.0 in | Wt 230.0 lb

## 2022-03-26 DIAGNOSIS — Z0000 Encounter for general adult medical examination without abnormal findings: Secondary | ICD-10-CM

## 2022-03-26 DIAGNOSIS — Z748 Other problems related to care provider dependency: Secondary | ICD-10-CM

## 2022-03-26 NOTE — Progress Notes (Signed)
I connected with  [name] on 03/26/22 by a audio enabled telemedicine application and verified that I am speaking with the correct person using two identifiers.  Patient Location: Home  Provider Location: Office/Clinic  I discussed the limitations of evaluation and management by telemedicine. The patient expressed understanding and agreed to proceed.  Subjective:   [name] is a 57 y.o. female who presents for Medicare Annual (Subsequent) preventive examination.  Review of Systems    Cardiac Risk Factors include: advanced age (>67mn, >>40women);diabetes mellitus;dyslipidemia;hypertension;obesity (BMI >30kg/m2);sedentary lifestyle     Objective:    Today's Vitals   03/26/22 0855  Weight: 230 lb (104.3 kg)  Height: '5\' 6"'$  (1.676 m)   Body mass index is 37.12 kg/m.     03/26/2022    9:17 AM 02/12/2022    1:01 PM 10/16/2021    3:00 PM 07/08/2021   12:00 PM 07/07/2021    9:56 AM 06/12/2021    1:58 PM 06/09/2021    1:23 PM  Advanced Directives  Does Patient Have a Medical Advance Directive? Yes No No Yes Yes Yes   Type of APhysicist, medicalof [place] will   Does patient want to make changes to medical advance directive?    No - Patient declined  No - Patient declined   Copy of [place]?    Yes - validated most recent copy scanned in chart (See row information)  No - copy requested   Would patient like information on creating a medical advance directive?  No - Patient declined Yes (Inpatient - patient requests chaplain consult to create a medical advance directive) No - Patient declined  No - Patient declined No - Patient declined    Current Medications (verified) Outpatient Encounter Medications as of 03/26/2022  Medication Sig   amitriptyline (ELAVIL) 25 MG tablet Take 2-3 tablets (50-75 mg total) by mouth at bedtime.   baclofen (LIORESAL) 10 MG tablet TAKE 1 TABLET BY  MOUTH THREE TIMES A DAY AS NEEDED FOR MUSCLE SPASMS   BD PEN NEEDLE NANO 2ND GEN 32G X 4 MM MISC USE WITH HUMALOG TO FILL PUMP DAILY   bisacodyl (DULCOLAX) 5 MG EC tablet Take 1 tablet (5 mg total) by mouth daily as needed for moderate constipation.   blood glucose meter kit and supplies KIT Dispense based on patient and insurance preference. Use up to four times daily as directed. (FOR E11.65 Z79.4).   butalbital-acetaminophen-caffeine (FIORICET) 50-325-40 MG tablet Take 1-2 tablets by mouth every 6 (six) hours as needed for headache.   carvedilol (COREG) 25 MG tablet Take 25 mg by mouth 2 (two) times daily.   cholecalciferol (VITAMIN D3) 25 MCG (1000 UNIT) tablet Take 1,000 Units by mouth daily.   ciclopirox (PENLAC) 8 % solution Apply 1 Application topically at bedtime.   cloNIDine (CATAPRES) 0.1 MG tablet Take 0.1 mg by mouth daily as needed (For blood pressure overm160/90).   clopidogrel (PLAVIX) 75 MG tablet Take 1 tablet (75 mg total) by mouth daily.   Continuous Blood Gluc Sensor (DEXCOM G6 SENSOR) MISC Use to monitor blood sugar, change after 10 days   COVID-19 At Home Antigen Test KIT Use as directed.   cycloSPORINE (RESTASIS) 0.05 % ophthalmic emulsion Place 2 drops into both eyes 2 (two) times daily.   Dulaglutide (TRULICITY) 4.5 M0000000SOPN Inject 4.5 mg as directed once a week.   DULoxetine (CYMBALTA) 60 MG capsule  Take 1 capsule (60 mg total) by mouth daily.   EPINEPHrine 0.3 mg/0.3 mL IJ SOAJ injection Inject 0.3 mg into the muscle as needed for anaphylaxis.   ezetimibe (ZETIA) 10 MG tablet Take 1 tablet (10 mg total) by mouth daily.   furosemide (LASIX) 20 MG tablet Take 20 mg by mouth.   gabapentin (NEURONTIN) 600 MG tablet 600 mg qAM, 600 mg qPM, 1200 mg QHS   Glucagon (GVOKE HYPOPEN 2-PACK) 1 MG/0.2ML SOAJ Inject 1 mg into the skin once as needed for up to 1 dose (hypoglycemia). Only for severe low blood sugar not responding to drinking juice/glucose tablets, when CBG is <70    Glucose Blood (BLOOD GLUCOSE TEST STRIPS) STRP Use as directed to monitor blood sugar up to 4x a day FOR E11.65 Z79.4   Insulin Disposable Pump (OMNIPOD 5 G6 PODS, GEN 5,) MISC 1 Device by Does not apply route every 3 (three) days.   insulin glargine, 2 Unit Dial, (TOUJEO MAX SOLOSTAR) 300 UNIT/ML Solostar Pen 70 units in am   insulin lispro (HUMALOG KWIKPEN) 100 UNIT/ML KwikPen Max daily dose 160 units   Lancets MISC Use as directed to monitor blood sugar up to 4x a day FOR E11.65 Z79.4   levETIRAcetam (KEPPRA) 500 MG tablet Take 500-750 mg by mouth See admin instructions. Take 1 tablet ('500mg'$ ) by mouth every morning and take 1 tablets by mouth ('750mg'$ ) by mouth every night   mirabegron ER (MYRBETRIQ) 50 MG TB24 tablet Take 1 tablet (50 mg total) by mouth daily.   olmesartan (BENICAR) 40 MG tablet Take 1 tablet (40 mg total) by mouth daily.   pantoprazole (PROTONIX) 40 MG tablet Take 1 tablet (40 mg total) by mouth daily.   polyethylene glycol (MIRALAX / GLYCOLAX) 17 g packet Take 17 g by mouth daily as needed for mild constipation.   rosuvastatin (CRESTOR) 10 MG tablet Take 1 tablet (10 mg total) by mouth daily.   senna-docusate (SENOKOT-S) 8.6-50 MG tablet Take 1 tablet by mouth at bedtime as needed for mild constipation.   sodium phosphate (FLEET) 7-19 GM/118ML ENEM Place 133 mLs (1 enema total) rectally daily as needed for severe constipation.   WIXELA INHUB 250-50 MCG/DOSE AEPB Inhale 1 puff into the lungs 2 (two) times daily.   No facility-administered encounter medications on file as of 03/26/2022.    Allergies (verified) Codeine, Farxiga [dapagliflozin], and Lactose intolerance (gi)   History: Past Medical History:  Diagnosis Date   Anxiety and depression 09/13/2006   Qualifier: Diagnosis of  By: Hassell Done [name], Nykedtra     Arthritis    joint pain    Asthma    COPD (chronic obstructive pulmonary disease) ([place])    chronic bronchitis    Depression    Diabetes mellitus    since  age 76; type 2 IDDM   Diabetic neuropathy, painful ([place])    FEET AND HANDS   Diabetic retinopathy ([place])    Diastolic dysfunction    a.  Echo 11/17: EF 60-65%, mild LVH, no RWMA, Gr1DD, mild AI, dilated aortic root measuring 38 mm, mildly dilated ascending aorta   Dilated aortic root (HCC)    53m by echo 11/2015   Dizziness    secondary to diabetes and hypertension    Epilepsy idiopathic petit mal ([place]    last seizure 2012;controlled w/ topomax   GERD (gastroesophageal reflux disease)    Glaucoma    NOT ON ANY EYE DROPS    Headache(784.0)    migraines  Heart murmur    born with    History of stress test    a. 11/17: Normal perfusion, EF 53%, normal study   Hx of blood clots    hematomas removed from left side of brain from 47mo to 89yrold    Hyperlipidemia    Hypertension    Legally blind    left eye    MIGRAINE HEADACHE 09/14/2006   Qualifier: Diagnosis of  By: MaHassell DoneNP, Nykedtra     [facility]   a.  Patient reported history of without objective documentation   Nephrolithiasis    frequent urination , urination at nite  PT SEEN IN ER 09/22/11 FOR BACK AND RT SIDED PAIN--HAS KNOWN STONE RT URETER AND UA IN ER SHOWED UTI   Pain 09/23/2011   LOWER BACK AND RIGHT SIDE--PT HAS RIGHT URETERAL STONE   Pneumonia    hx of 2009   Pyelonephritis    SBO (small bowel obstruction) ([place]04/05/2021   Seizures ([facility]   [facility] ap[facility]    [facility] ; mild   Small bowel obstruction ([place]04/30/2023   Stress incontinence    Stroke ([place]   last 2003  RESIDUAL LEFT LEG WEAKNESS--NO OTHER RESIDUAL PROBLEMS   Past Surgical History:  Procedure Laterality Date   BRAIN HEMATOMA EVACUATION     five procedures total, first procedure when [age] monthsld, last at [age]ears of age.   CYSTOSCOPY W/ RETROGRADES  09/24/2011   Procedure: CYSTOSCOPY WITH RETROGRADE PYELOGRAM;  Surgeon: JoMalka SoMD;  [facility];  Service: Urology;   Laterality: Right;   CYSTOSCOPY WITH URETEROSCOPY  09/24/2011   Procedure: CYSTOSCOPY WITH URETEROSCOPY;  Surgeon: JoMalka SoMD;  [facility];  Service: Urology;  Laterality: Right;  Balloon dilation right ureter    CYSTOSCOPY/RETROGRADE/URETEROSCOPY  08/24/2011   Procedure: CYSTOSCOPY/RETROGRADE/URETEROSCOPY;  Surgeon: DaMolli HazardMD;  [facility];  Service: Urology;  Laterality: Right;  Cysto, Right retrograde Pyelogram, right stent placement.    INCISIONAL HERNIA REPAIR  05/05/2011   Procedure: LAPAROSCOPIC INCISIONAL HERNIA;  Surgeon: MaRolm BookbinderMD;  [facility] Service: General;  Laterality: N/A;   kidney stone removal     LAPAROTOMY N/A 06/12/2021   Procedure: EXPLORATORY LAPAROTOMY, LYSIS OF ADHESIONS;  Surgeon: PiOlean ReeMD;  [facility];  Service: General;  Laterality: N/A;   MASS EXCISION  05/05/2011   Procedure: EXCISION MASS;  Surgeon: MaRolm BookbinderMD;  [facility] Service: General;  Laterality: Right;   PCNL     RETINAL DETACHMENT SURGERY Left 1990   RIGHT/LEFT HEART CATH AND CORONARY ANGIOGRAPHY N/A 04/28/2019   Procedure: RIGHT/LEFT HEART CATH AND CORONARY ANGIOGRAPHY;  Surgeon: CaYolonda KidaMD;  [facility];  Service: Cardiovascular;  Laterality: N/A;   SHUNT REMOVAL     shunt inserted at age 70 30emoved at age 57  [place] Family History  Problem Relation Age of Onset   Uterine cancer Mother    Hypertension Mother    Cancer Mother    Brain cancer Maternal Grandmother    Hypertension Maternal Grandmother    Birth defects Daughter    Hypertension Daughter    Cirrhosis Maternal Grandfather    ADD / ADHD Maternal Grandfather    Birth defects Maternal Grandfather    Diabetes Maternal Grandfather    Anesthesia problems Neg Hx  Colon cancer Neg Hx    Esophageal cancer Neg Hx    Pancreatic cancer Neg Hx    Social History   Socioeconomic History   Marital status:  Widowed    Spouse name: Not on file   Number of children: 2   Years of education: Not on file   Highest education level: Not on file  Occupational History   Occupation: disabled  Tobacco Use   Smoking status: Former    Packs/day: 1.00    Years: 15.00    Total pack years: 15.00    Types: Cigarettes    Quit date: 01/20/1987    Years since quitting: 35.2   Smokeless tobacco: Never   Tobacco comments:    quit more than 20 years  Vaping Use   Vaping Use: Never used  Substance and Sexual Activity   Alcohol use: Yes    Alcohol/week: 1.0 standard drink of alcohol    Types: 1 Glasses of wine per week   Drug use: No    Comment: hx of marijuana use no longer uses    Sexual activity: Yes    Partners: Male    Birth control/protection: Surgical  Other Topics Concern   Not on file  Social History Narrative   Pt lives with her daughter   Social Determinants of Health   Financial Resource Strain: Low Risk  (03/26/2022)   Overall Financial Resource Strain (CARDIA)    Difficulty of Paying Living Expenses: Not hard at all  Food Insecurity: No Food Insecurity (03/26/2022)   Hunger Vital Sign    Worried About Running Out of Food in the Last Year: Never true    [place] in the Last Year: Never true  Transportation Needs: No Transportation Needs (03/26/2022)   PRAPARE - Hydrologist (Medical): No    Lack of Transportation (Non-Medical): No  Physical Activity: Inactive (03/26/2022)   Exercise Vital Sign    Days of Exercise per Week: 0 days    Minutes of Exercise per Session: 0 min  Stress: No Stress Concern Present (03/26/2022)   [place]    Feeling of Stress : Not at all  Social Connections: Unknown (03/26/2022)   Social Connection and Isolation Panel [NHANES]    Frequency of Communication with Friends and Family: More than three times a week    Frequency of Social Gatherings with Friends and  Family: More than three times a week    Attends Religious Services: Not on file    Active Member of Clubs or Organizations: No    Attends Archivist Meetings: Never    Marital Status: Widowed    Tobacco Counseling Counseling given: Not Answered Tobacco comments: quit more than 20 years   Clinical Intake:  Pre-visit preparation completed: Yes  Pain : No/denies pain     BMI - recorded: 37.12 Nutritional Status: BMI > 30  Obese Nutritional Risks: None Diabetes: Yes CBG done?: Yes (pt took at home 202 this am) CBG resulted in Enter/ Edit results?: No Did pt. bring in CBG monitor from home?: No  How often do you need to have someone help you when you read instructions, pamphlets, or other written materials from your doctor or pharmacy?: 1 - Never  Diabetic?yes  Interpreter Needed?: No  Information entered by :: B.Kally Cadden,LPN   Activities of Daily Living    03/26/2022    9:14 AM 01/23/2022   11:11 AM  In  your present state of health, do you have any difficulty performing the following activities:  Hearing? 1 0  Vision? 1 0  Difficulty concentrating or making decisions? 0 0  Walking or climbing stairs? 1 1  Dressing or bathing? 0 0  Doing errands, shopping? 1 0  Preparing Food and eating ? N   Using the Toilet? N   In the past six months, have you accidently leaked urine? N   Do you have problems with loss of bowel control? N   Managing your Medications? N   Managing your Finances? N   Housekeeping or managing your Housekeeping? N     Patient Care Team: Delsa Grana, [name] as PCP - General (Family Medicine) Elayne Snare, MD as Consulting Physician (Endocrinology) Anabel Bene, MD as Referring Physician (Neurology) Yolonda Kida, MD as Consulting Physician (Cardiology) Gillis Santa, MD as Consulting Physician (Pain Medicine) Ottie Glazier, MD as Consulting Physician (Pulmonary Disease) Samara Deist, DPM as Referring Physician  (Podiatry)  Indicate any recent Medical Services you may have received from other than [facility] providers in the past year (date may be approximate).     Assessment:   This is a routine wellness examination for [place].  Hearing/Vision screen Hearing Screening - Comments:: Hearing adequate Vision Screening - Comments:: Legally blind in left eye; does not see well in rt Needs new eye provider  Dietary issues and exercise activities discussed: Exercise limited by: cardiac condition(s);neurologic condition(s)   Goals Addressed               This Visit's Progress     My blood sugars have been in the 300s and 400s recently (pt-stated)   Not on track     Current Barriers:  Knowledge Deficits related to basic Diabetes pathophysiology and self care/management  Nurse Case Manager Clinical Goal(s):  Over the next 14 days, patient will demonstrate improved adherence to prescribed treatment plan for diabetes self care/management as evidenced by checking blood glucose levels as prescribed, adhering to ADA/low carb diet, daily exercise, and medication adherence-goal completed 09/12/2018 Over the next 60 days, patient will continue to demonstrate improved adherence to prescribed treatment plan for diabetes self care/management as evidenced by checking blood glucose levels as prescribed, adhering to ADA/low carb diet, daily exercise, and medication adherence  Interventions:  Reviewed blood sugar log Discussed importance of carb counting with patient and daughter Strongly encouraged patient to exercise/walk daily and discussed how this can lower blood sugars Assessed for receipt of previously provided DM education via mail Reviewed scheduled/upcoming provider appointments including: 08/12/2018 PCP, 08/15/2018 urology Advised patient, providing education and rationale, to check cbg per [facility] recommendations and call endocrinology office if blood sugars do not respond to hyperglycemia  treatment  Patient Self Care Activities:  Self administers medications as prescribed Attends all scheduled provider appointments Checks blood sugars as prescribed and utilize hyper and hypoglycemia protocol as needed Adhere to ADA diet as discussed   Please see past updates related to this goal by clicking on the "Past Updates" button in the selected goal          Depression Screen    03/26/2022    9:05 AM 02/12/2022    1:00 PM 01/23/2022   11:11 AM 12/15/2021    1:39 PM 12/01/2021   10:04 AM 11/17/2021   10:50 AM 08/14/2021   11:13 AM  PHQ 2/9 Scores  PHQ - 2 Score 0 0 0 0 0 0 3  PHQ- 9 Score   0  0 0 0 11    Fall Risk    03/26/2022    8:59 AM 02/12/2022    1:00 PM 01/23/2022   11:10 AM 12/15/2021    1:39 PM 12/01/2021   10:04 AM  Fall Risk   Falls in the past year? 1 0 '1 1 1  '$ Comment end of september      Number falls in past yr: 0  '1 1 1  '$ Injury with Fall? '1  1 1 1  '$ Risk for fall due to : History of fall(s);Impaired vision  Impaired balance/gait;Impaired mobility;History of fall(s) Impaired balance/gait Impaired balance/gait;History of fall(s)  Follow up Education provided;Falls prevention discussed  Falls prevention discussed;Education provided;Falls evaluation completed Falls prevention discussed;Education provided;Falls evaluation completed Falls prevention discussed;Education provided;Falls evaluation completed    FALL RISK PREVENTION PERTAINING TO THE HOME:  Any stairs in or around the home? Yes 2 stepsoutside If so, are there any without handrails? No  Home free of loose throw rugs in walkways, pet beds, electrical cords, etc? Yes  Adequate lighting in your home to reduce risk of falls? Yes   ASSISTIVE DEVICES UTILIZED TO PREVENT FALLS:  Life alert? No  Use of a cane, walker or w/c? Yes rollater when outside home otherwise 4-prong cane Grab bars in the bathroom? Yes portable grip bar Shower chair or bench in shower? Yes  Elevated toilet seat or a handicapped  toilet? Yes    Cognitive Function:        03/26/2022    9:15 AM  6CIT Screen  What Year? 0 points  What month? 0 points  What time? 0 points  Count back from 20 0 points  Months in reverse 0 points  Repeat phrase 2 points  Total Score 2 points    Immunizations Immunization History  Administered Date(s) Administered   Influenza Split 10/07/2012   Influenza Whole 11/20/2006   Influenza, Seasonal, Injecte, Preservative Fre 11/12/2006   Influenza,inj,Quad PF,6+ Mos 10/30/2013, 10/19/2014, 01/24/2018, 10/12/2018, 11/13/2020   Influenza-Unspecified 12/20/2019, 11/02/2021   PFIZER(Purple Top)SARS-COV-2 Vaccination 11/29/2019, 12/20/2019   PPD Test 11/12/2006   Pfizer Covid-19 Vaccine Bivalent Booster 19yr & up 04/25/2021   Pneumococcal Conjugate-13 04/29/2015   Pneumococcal Polysaccharide-23 11/20/2006, 09/25/2011, 04/29/2019   Tdap 06/23/2018    TDAP status: Up to date  Flu Vaccine status: Up to date  Pneumococcal vaccine status: Up to date  Covid-19 vaccine status: Completed vaccines  Qualifies for Shingles Vaccine? Yes   Zostavax completed no    Screening Tests Health Maintenance  Topic Date Due   Zoster Vaccines- Shingrix (1 of 2) Never done   MAMMOGRAM  08/12/2017   PAP-Cervical Cytology Screening  06/23/2021   PAP SMEAR-Modifier  06/23/2021   COVID-19 Vaccine (4 - 2023-24 season) 09/19/2021   HEMOGLOBIN A1C  07/24/2022   OPHTHALMOLOGY EXAM  09/18/2022   Diabetic kidney evaluation - Urine ACR  11/18/2022   Diabetic kidney evaluation - eGFR measurement  01/24/2023   FOOT EXAM  01/24/2023   Medicare Annual Wellness (AWV)  03/26/2023   COLONOSCOPY (Pts 45-473yrInsurance coverage will need to be confirmed)  01/20/2027   DTaP/Tdap/Td (2 - Td or Tdap) 06/22/2028   INFLUENZA VACCINE  Completed   Hepatitis C Screening  Completed   HIV Screening  Completed   HPV VACCINES  Aged Out    Health Maintenance  Health Maintenance Due  Topic Date Due   Zoster  Vaccines- Shingrix (1 of 2) Never done   MAMMOGRAM  08/12/2017   PAP-Cervical Cytology Screening  06/23/2021   PAP SMEAR-Modifier  06/23/2021   COVID-19 Vaccine (4 - 2023-24 season) 09/19/2021    Colorectal cancer screening: Type of screening: Colonoscopy. Completed yes. Repeat every 10 years  Mammogram status: Completed yes. Repeat every year   Lung Cancer Screening: (Low Dose CT Chest recommended if Age 48-80 years, 30 pack-year currently smoking OR have quit w/in 15years.) does not qualify.   Lung Cancer Screening Referral: no  Additional Screening:  Hepatitis C Screening: does not qualify; Completed yes  Vision Screening: Recommended annual ophthalmology exams for early detection of glaucoma and other disorders of the eye. Is the patient up to date with their annual eye exam?  No  Who is the provider or what is the name of the office in which the patient attends annual eye exams? Needs new provider..will make appt for Dr Truman Hayward at [facility] in [place] If pt is not established with a provider, would they like to be referred to a provider to establish care? No . Will call Dr Truman Hayward  Dental Screening: Recommended annual dental exams for proper oral hygiene  Community Resource Referral / Chronic Care Management: CRR required this visit?  No   CCM required this visit?  Yes for assistance w/transportation to medical appts     Plan:     I have personally reviewed and noted the following in the patient's chart:   Medical and social history Use of alcohol, tobacco or illicit drugs  Current medications and supplements including opioid prescriptions. Patient is not currently taking opioid prescriptions. Functional ability and status Nutritional status Physical activity Advanced directives List of other physicians Hospitalizations, surgeries, and ER visits in previous 12 months Vitals Screenings to include cognitive, depression, and falls Referrals and appointments  In  addition, I have reviewed and discussed with patient certain preventive protocols, quality metrics, and best practice recommendations. A written personalized care plan for preventive services as well as general preventive health recommendations were provided to patient.     Roger Shelter, LPN   [id number]   Nurse Notes: pt states she is doing good at this time. Pt lives with her daughter and Alyse Low independent in her ADLs. Pt does not drive anymore as sight is not good. She relys on her daughter and takes [employer] Po[employer] for transportation. Pt has no concerns or questions at this visit.   Referral to CCM for transportation made:pt is legally blind in lft eye and poor sight in rt eye.

## 2022-03-26 NOTE — Patient Instructions (Signed)
[name] , Thank you for taking time to come for your Medicare Wellness Visit. I appreciate your ongoing commitment to your health goals. Please review the following plan we discussed and let me know if I can assist you in the future.   These are the goals we discussed:  Goals       "I would like my own place" completed (pt-stated)      Current Barriers:  Financial constraints Housing barriers  Clinical Social Work Clinical Goal(s):  Over the next 90 days, client will work with SW to address concerns related to available affordable housing options  Interventions: Patient interviewed and appropriate assessments performed Confirmed with patient that information about affordable housing resources mailed to her home were received Confirmed that patient will be remaining with family at this time and has postponed plans to move Advised patient to save housing resources provided for use in the future if needed Advised patient to call this Education officer, museum if assistance is needed in the future  Patient Self Care Activities:  Performs ADL's independently Performs IADL's independently Calls provider office for new concerns or questions  Please see past updates related to this goal by clicking on the "Past Updates" button in the selected goal        My blood sugars have been in the 300s and 400s recently (pt-stated)      Current Barriers:  Knowledge Deficits related to basic Diabetes pathophysiology and self care/management  Nurse Case Manager Clinical Goal(s):  Over the next 14 days, patient will demonstrate improved adherence to prescribed treatment plan for diabetes self care/management as evidenced by checking blood glucose levels as prescribed, adhering to ADA/low carb diet, daily exercise, and medication adherence-goal completed 09/12/2018 Over the next 60 days, patient will continue to demonstrate improved adherence to prescribed treatment plan for diabetes self care/management as  evidenced by checking blood glucose levels as prescribed, adhering to ADA/low carb diet, daily exercise, and medication adherence  Interventions:  Reviewed blood sugar log Discussed importance of carb counting with patient and daughter Strongly encouraged patient to exercise/walk daily and discussed how this can lower blood sugars Assessed for receipt of previously provided DM education via mail Reviewed scheduled/upcoming provider appointments including: 08/12/2018 PCP, 08/15/2018 urology Advised patient, providing education and rationale, to check cbg per [facility] recommendations and call endocrinology office if blood sugars do not respond to hyperglycemia treatment  Patient Self Care Activities:  Self administers medications as prescribed Attends all scheduled provider appointments Checks blood sugars as prescribed and utilize hyper and hypoglycemia protocol as needed Adhere to ADA diet as discussed   Please see past updates related to this goal by clicking on the "Past Updates" button in the selected goal         Weight (lb) < 220 lb (99.8 kg)      Starting 05/07/2016 I will start going to a diabetes counselor to help lose extra weight.        This is a list of the screening recommended for you and due dates:  Health Maintenance  Topic Date Due   Zoster (Shingles) Vaccine (1 of 2) Never done   Mammogram  08/12/2017   Pap Smear  06/23/2021   Pap Smear  06/23/2021   COVID-19 Vaccine (4 - 2023-24 season) 09/19/2021   Hemoglobin A1C  07/24/2022   Eye exam for diabetics  09/18/2022   Yearly kidney health urinalysis for diabetes  11/18/2022   Yearly kidney function blood test for diabetes  01/24/2023  Complete foot exam   01/24/2023   Medicare Annual Wellness Visit  03/26/2023   Colon Cancer Screening  01/20/2027   DTaP/Tdap/Td vaccine (2 - Td or Tdap) 06/22/2028   Flu Shot  Completed   Hepatitis C Screening: USPSTF Recommendation to screen - Ages 18-79 yo.  Completed    HIV Screening  Completed   HPV Vaccine  Aged Out    Advanced directives: yes  Conditions/risks identified: falls risk  Next appointment: Follow up in one year for your annual wellness visit. 04/01/2023'@8'$ :45am telephone  Preventive Care 40-64 Years, Female Preventive care refers to lifestyle choices and visits with your health care provider that can promote health and wellness. What does preventive care include? A yearly physical exam. This is also called an annual well check. Dental exams once or twice a year. Routine eye exams. Ask your health care provider how often you should have your eyes checked. Personal lifestyle choices, including: Daily care of your teeth and gums. Regular physical activity. Eating a healthy diet. Avoiding tobacco and drug use. Limiting alcohol use. Practicing safe sex. Taking low-dose aspirin daily starting at age 56. Taking vitamin and mineral supplements as recommended by your health care provider. What happens during an annual well check? The services and screenings done by your health care provider during your annual well check will depend on your age, overall health, lifestyle risk factors, and family history of disease. Counseling  Your health care provider may ask you questions about your: Alcohol use. Tobacco use. Drug use. Emotional well-being. Home and relationship well-being. Sexual activity. Eating habits. Work and work Statistician. Method of birth control. Menstrual cycle. Pregnancy history. Screening  You may have the following tests or measurements: Height, weight, and BMI. Blood pressure. Lipid and cholesterol levels. These may be checked every 5 years, or more frequently if you are over 50 years old. Skin check. Lung cancer screening. You may have this screening every year starting at age 70 if you have a 30-pack-year history of smoking and currently smoke or have quit within the past 15 years. Fecal occult blood test  (FOBT) of the stool. You may have this test every year starting at age 71. Flexible sigmoidoscopy or colonoscopy. You may have a sigmoidoscopy every 5 years or a colonoscopy every 10 years starting at age 31. Hepatitis C blood test. Hepatitis B blood test. Sexually transmitted disease (STD) testing. Diabetes screening. This is done by checking your blood sugar (glucose) after you have not eaten for a while (fasting). You may have this done every 1-3 years. Mammogram. This may be done every 1-2 years. Talk to your health care provider about when you should start having regular mammograms. This may depend on whether you have a family history of breast cancer. BRCA-related cancer screening. This may be done if you have a family history of breast, ovarian, tubal, or peritoneal cancers. Pelvic exam and Pap test. This may be done every 3 years starting at age 14. Starting at age 39, this may be done every 5 years if you have a Pap test in combination with an HPV test. Bone density scan. This is done to screen for osteoporosis. You may have this scan if you are at high risk for osteoporosis. Discuss your test results, treatment options, and if necessary, the need for more tests with your health care provider. Vaccines  Your health care provider may recommend certain vaccines, such as: Influenza vaccine. This is recommended every year. Tetanus, diphtheria, and acellular pertussis (Tdap, Td)  vaccine. You may need a Td booster every 10 years. Zoster vaccine. You may need this after age 56. Pneumococcal 13-valent conjugate (PCV13) vaccine. You may need this if you have certain conditions and were not previously vaccinated. Pneumococcal polysaccharide (PPSV23) vaccine. You may need one or two doses if you smoke cigarettes or if you have certain conditions. Talk to your health care provider about which screenings and vaccines you need and how often you need them. This information is not intended to replace  advice given to you by your health care provider. Make sure you discuss any questions you have with your health care provider. Document Released: 02/01/2015 Document Revised: 09/25/2015 Document Reviewed: 11/06/2014 Elsevier Interactive Patient Education  2017 [place] Prevention in the Home Falls can cause injuries. They can happen to people of all ages. There are many things you can do to make your home safe and to help prevent falls. What can I do on the outside of my home? Regularly fix the edges of walkways and driveways and fix any cracks. Remove anything that might make you trip as you walk through a door, such as a raised step or threshold. Trim any bushes or trees on the path to your home. Use bright outdoor lighting. Clear any walking paths of anything that might make someone trip, such as rocks or tools. Regularly check to see if handrails are loose or broken. Make sure that both sides of any steps have handrails. Any raised decks and porches should have guardrails on the edges. Have any leaves, snow, or ice cleared regularly. Use sand or salt on walking paths during winter. Clean up any spills in your garage right away. This includes oil or grease spills. What can I do in the bathroom? Use night lights. Install grab bars by the toilet and in the tub and shower. Do not use towel bars as grab bars. Use non-skid mats or decals in the tub or shower. If you need to sit down in the shower, use a plastic, non-slip stool. Keep the floor dry. Clean up any water that spills on the floor as soon as it happens. Remove soap buildup in the tub or shower regularly. Attach bath mats securely with double-sided non-slip rug tape. Do not have throw rugs and other things on the floor that can make you trip. What can I do in the bedroom? Use night lights. Make sure that you have a light by your bed that is easy to reach. Do not use any sheets or blankets that are too big for your  bed. They should not hang down onto the floor. Have a firm chair that has side arms. You can use this for support while you get dressed. Do not have throw rugs and other things on the floor that can make you trip. What can I do in the kitchen? Clean up any spills right away. Avoid walking on wet floors. Keep items that you use a lot in easy-to-reach places. If you need to reach something above you, use a strong step stool that has a grab bar. Keep electrical cords out of the way. Do not use floor polish or wax that makes floors slippery. If you must use wax, use non-skid floor wax. Do not have throw rugs and other things on the floor that can make you trip. What can I do with my stairs? Do not leave any items on the stairs. Make sure that there are handrails on both sides of  the stairs and use them. Fix handrails that are broken or loose. Make sure that handrails are as long as the stairways. Check any carpeting to make sure that it is firmly attached to the stairs. Fix any carpet that is loose or worn. Avoid having throw rugs at the top or bottom of the stairs. If you do have throw rugs, attach them to the floor with carpet tape. Make sure that you have a light switch at the top of the stairs and the bottom of the stairs. If you do not have them, ask someone to add them for you. What else can I do to help prevent falls? Wear shoes that: Do not have high heels. Have rubber bottoms. Are comfortable and fit you well. Are closed at the toe. Do not wear sandals. If you use a stepladder: Make sure that it is fully opened. Do not climb a closed stepladder. Make sure that both sides of the stepladder are locked into place. Ask someone to hold it for you, if possible. Clearly mark and make sure that you can see: Any grab bars or handrails. First and last steps. Where the edge of each step is. Use tools that help you move around (mobility aids) if they are needed. These  include: Canes. Walkers. Scooters. Crutches. Turn on the lights when you go into a dark area. Replace any light bulbs as soon as they burn out. Set up your furniture so you have a clear path. Avoid moving your furniture around. If any of your floors are uneven, fix them. If there are any pets around you, be aware of where they are. Review your medicines with your doctor. Some medicines can make you feel dizzy. This can increase your chance of falling. Ask your doctor what other things that you can do to help prevent falls. This information is not intended to replace advice given to you by your health care provider. Make sure you discuss any questions you have with your health care provider. Document Released: 11/01/2008 Document Revised: 06/13/2015 Document Reviewed: 02/09/2014 Elsevier Interactive Patient Education  2017 [employer].

## 2022-03-27 ENCOUNTER — Telehealth: Payer: Self-pay | Admitting: *Deleted

## 2022-03-27 NOTE — Progress Notes (Unsigned)
  Care Coordination  Outreach Note  03/27/2022 Name: [name] MRN: [id number] DOB: Apr 15, 1965   Care Coordination Outreach Attempts: unsuccessful outreach to schedule in response to a referral   Follow Up Plan:  Additional outreach attempts will be made   Encounter Outcome:  No Answer  Julian Hy, [place] Direct Dial: [phone]

## 2022-03-31 NOTE — Progress Notes (Signed)
  Care Coordination   Note   03/31/2022 Name: [name] MRN: [id number] DOB: 13-Feb-1965  [name] is a 57 y.o. year old female who sees Delsa Grana, [place] for primary care. I reached out to [name] by phone today in response to a referral   Follow up plan:  Telephone appointment with care coordination team member scheduled for:  04/03/2022  Encounter Outcome:  Pt. Scheduled  Julian Hy, [place] Direct Dial: [phone]

## 2022-04-02 ENCOUNTER — Telehealth: Payer: Self-pay | Admitting: *Deleted

## 2022-04-02 NOTE — Progress Notes (Signed)
  Care Coordination Note  04/02/2022 Name: Yolando Gillum MRN: [id number] DOB: 04-Aug-1965  Koren Plyler is a 57 y.o. year old female who is a primary care patient of Laurell Roof and is actively engaged with the care management team. I reached out to [name] by phone today to assist with re-scheduling an initial visit with the BSW  Follow up plan: Telephone appointment with care management team member scheduled for:04/06/22  [place]: [phone]

## 2022-04-03 ENCOUNTER — Encounter: Payer: 59 | Admitting: *Deleted

## 2022-04-06 ENCOUNTER — Ambulatory Visit: Payer: Self-pay

## 2022-04-06 NOTE — Patient Instructions (Signed)
Visit Information  Thank you for taking time to visit with me today. Please don't hesitate to contact me if I can be of assistance to you.   Following are the goals we discussed today:   Goals Addressed             This Visit's Progress    Transportation assistance       Care Coordination Interventions: [employer] is contacted and confirmed that patient is active through [place] her insurance and would need to apply for [employer] for additional rides outside of the allotted number of rides allowed [phone]). Application to [employer] for ADA accessibility rides to be mailed to patient          Our next appointment is by telephone on 05/07/22 at 10:30am  Please call the care guide team at [phone] if you need to cancel or reschedule your appointment.   If you are experiencing a Mental Health or [place] or need someone to talk to, please call 911  Patient verbalizes understanding of instructions and care plan provided today and agrees to view in [employer]. Active MyChart status and patient understanding of how to access instructions and care plan via MyChart confirmed with patient.     Telephone follow up appointment with care management team member scheduled for: 05/07/22 a 10:30 am  Lindie Spruce, [place] Worker [facility] Care Management  [phone]

## 2022-04-06 NOTE — Patient Outreach (Signed)
  Care Coordination   Initial Visit Note   04/06/2022 Name: Keicha Pantaleo MRN: [id number] DOB: 25-Feb-1965  Cathaleen Freye is a 57 y.o. year old female who sees Delsa Grana, [place] for primary care. I spoke with  [name] by phone today.  What matters to the patients health and wellness today?  Patient is requesting assistance with additional transportation to medical appointments once her number of rides end with [facility] transportation benefit.    Goals Addressed             This Visit's Progress    Transportation assistance       Care Coordination Interventions: [employer] is contacted and confirmed that patient is active through [place] her insurance and would need to apply for [employer] for additional rides outside of the allotted number of rides allowed [phone]). Application to [employer] for ADA accessibility rides to be mailed to patient          SDOH assessments and interventions completed:  Yes  SDOH Interventions Today    Flowsheet Row Most Recent Value  SDOH Interventions   Food Insecurity Interventions Intervention Not Indicated  [Patient rec's monthly subsidy from [place] Interventions Intervention Not Indicated  [Patient lives with daughter and is on the waiting list for [facility] housing]  Transportation Interventions Other (Comment)  [Patient states that she does not have Medicaid that covers transportation.]        Care Coordination Interventions:  Yes, provided  Interventions Today    Flowsheet Row Most Recent Value  Chronic Disease   Chronic disease during today's visit Diabetes, Congestive Heart Failure (CHF), Other  [Patient is legally blind]  General Interventions   General Interventions Discussed/Reviewed General Interventions Discussed, Airline pilot options discussed [facility] and [employer])]       Follow up plan: Follow up call scheduled for 05/07/22 at 10:30am    Encounter Outcome:  Pt.  Visit Completed

## 2022-04-11 ENCOUNTER — Other Ambulatory Visit: Payer: Self-pay | Admitting: Physician Assistant

## 2022-04-11 DIAGNOSIS — E1165 Type 2 diabetes mellitus with hyperglycemia: Secondary | ICD-10-CM

## 2022-04-11 DIAGNOSIS — E162 Hypoglycemia, unspecified: Secondary | ICD-10-CM

## 2022-04-11 DIAGNOSIS — E119 Type 2 diabetes mellitus without complications: Secondary | ICD-10-CM

## 2022-04-13 NOTE — Telephone Encounter (Signed)
Requested medications are due for refill today.  Provider to determine  Requested medications are on the active medications list.  yes  Last refill. 02/17/2022  Future visit scheduled.   no  Notes to clinic.  Refill not assigned a protocol.    Requested Prescriptions  Pending Prescriptions Disp Refills   GVOKE HYPOPEN 2-PACK 1 MG/0.2ML [place] [Pharmacy Med Name: GVOKE HYPOPEN 2-PK 1 MG/0.2 ML]      Sig: Inject 1 mg into the skin once as needed for up to 1 dose (hypoglycemia). Only for severe low blood sugar not responding to drinking juice/glucose tablets, when CBG is <70     Off-Protocol Failed - 04/11/2022  9:50 AM      Failed - Medication not assigned to a protocol, review manually.      Passed - Valid encounter within last 12 months    Recent Outpatient Visits           2 months ago Uncontrolled type 2 diabetes mellitus with hyperglycemia [facility])   [facility] Delsa Grana, [name]   3 months ago Uncontrolled type 2 diabetes mellitus with hyperglycemia [facility])   [facility] Delsa Grana, [name]   4 months ago Uncontrolled type 2 diabetes mellitus with hyperglycemia [facility])   [facility] Delsa Grana, [name]   4 months ago Encounter for examination following treatment at [facility] Delsa Grana, [name]   7 months ago Erroneous encounter - disregard   [facility] Bo Merino, [name]

## 2022-04-14 ENCOUNTER — Other Ambulatory Visit (INDEPENDENT_AMBULATORY_CARE_PROVIDER_SITE_OTHER): Payer: 59

## 2022-04-14 DIAGNOSIS — E1165 Type 2 diabetes mellitus with hyperglycemia: Secondary | ICD-10-CM

## 2022-04-14 DIAGNOSIS — Z794 Long term (current) use of insulin: Secondary | ICD-10-CM

## 2022-04-14 LAB — BASIC METABOLIC PANEL
BUN: 16 mg/dL (ref 6–23)
CO2: 28 mEq/L (ref 19–32)
Calcium: 9.8 mg/dL (ref 8.4–10.5)
Chloride: 102 mEq/L (ref 96–112)
Creatinine, Ser: 0.86 mg/dL (ref 0.40–1.20)
GFR: 74.92 mL/min (ref >60.00)
Glucose, Bld: 176 mg/dL — ABNORMAL HIGH (ref 70–99)
Potassium: 4.1 mEq/L (ref 3.5–5.1)
Sodium: 138 mEq/L (ref 135–145)

## 2022-04-15 ENCOUNTER — Telehealth: Payer: Self-pay | Admitting: Endocrinology

## 2022-04-15 NOTE — Telephone Encounter (Signed)
Patient is calling for lab results from yesterday's lab draw.

## 2022-04-15 NOTE — Telephone Encounter (Signed)
Notified pt have appt tomorrow w/ Dr Dwyane Dee and can discuss the lab results.

## 2022-04-16 ENCOUNTER — Encounter: Payer: Self-pay | Admitting: Endocrinology

## 2022-04-16 ENCOUNTER — Ambulatory Visit (INDEPENDENT_AMBULATORY_CARE_PROVIDER_SITE_OTHER): Payer: 59 | Admitting: Endocrinology

## 2022-04-16 VITALS — BP 124/80 | HR 76 | Ht 66.0 in | Wt 236.0 lb

## 2022-04-16 DIAGNOSIS — E1165 Type 2 diabetes mellitus with hyperglycemia: Secondary | ICD-10-CM | POA: Diagnosis not present

## 2022-04-16 DIAGNOSIS — Z794 Long term (current) use of insulin: Secondary | ICD-10-CM | POA: Diagnosis not present

## 2022-04-16 LAB — POCT GLYCOSYLATED HEMOGLOBIN (HGB A1C): Hemoglobin A1C: 9.8 % — AB (ref 4.0–5.6)

## 2022-04-16 LAB — MICROALBUMIN / CREATININE URINE RATIO
Creatinine,U: 125.4 mg/dL
Microalb Creat Ratio: 2.9 mg/g (ref 0.0–30.0)
Microalb, Ur: 3.6 mg/dL — ABNORMAL HIGH (ref 0.0–1.9)

## 2022-04-16 MED ORDER — DEXCOM G7 RECEIVER DEVI: 0 refills | Status: DC

## 2022-04-16 MED ORDER — DEXCOM G7 SENSOR MISC: 1.0000 | 3 refills | Status: DC

## 2022-04-16 NOTE — Progress Notes (Signed)
Patient ID: [name], female   DOB: 1965/03/03, 57 y.o.   MRN: [id number]          Reason for Appointment: Follow-up for Type 2 Diabetes   History of Present Illness:          Date of diagnosis of type 2 diabetes mellitus:   1989      Background history:   She thinks she was initially treated with diet alone and subsequently given metformin With metformin she had nausea and diarrhea but she does not know if she was treated with any other medications until she was started on insulin in 2003.  Also was on glipizide in 2015 Most likely she was on Levemir or Lantus insulin for quite some time She was then switched to Toujeo in 2016 and although she was initially prescribed 90 units twice a day this was subsequently lowered  Her A1c has been occasionally between 7.2 and 7.5 but has been consistently over 9% since 2018  PREVIOUS OMNIPOD PUMP settings with Humalog U-200:  Basal settings: 12 AM = 2.6, 3 AM = 2.4, 9 AM = 2.7, 3 PM = 3.0 Sensitivity 1: 60 and target 140  Max bolus 6 units Active insulin time 6 hours  Recent history:    INSULIN regimen currently: Toujeo 70 units daily in a.m.  Humalog: 4 to 6 units to cover the food, 4 units for small meals and 6 units for full meals Additionally take Humalog if blood sugars are more than 150 as follows 150-200: Take 4 units 200-250: Take 6 units Over 300: Take 10 units  Non-insulin hypoglycemic drugs the patient is taking are: Trulicity 99991111 mg weekly  Her A1c is 9.8 vs 12%  Current management, blood sugar patterns and problems identified:  She has not been able to get the Dexcom sensor that was prescribed and not clear if this is approved or not at the [facility] Also an insulin pump prescription was sent to the mail order pharmacy but she has not received this Also she does not want to go back on insulin pump because of fear of hypoglycemia Again she says she wants her blood sugar to be no less than 150 Her blood  sugars are averaging about 200 on her monitor recently Currently using Accu-Chek which could not be downloaded Blood sugars are variable from day-to-day and although she is waking up with FASTING readings between 160 and 220 blood sugars may be sometimes lower early morning around 4 AM Blood sugars appear to be generally higher after supper at night and as high as 270 and mostly over 200 Blood sugars okay they may be a little low as 124 before dinner She says her daughter is fixing her meals and may not always be getting healthy diet The [place] was reportedly stopped last year possibly during hospitalization She is not doing any exercise   Side effects from medications have been:metformin caused GI side effects  Blood sugar readings from Accu-Chek as above, 30-day average 204  CGM data PREVIOUSLY   CGM use % of time 74  2-week average/GV 170/27  Time in range 53  % Time Above 180 43+4  % Time above 250   % Time Below 70      PRE-MEAL Fasting Lunch Dinner Bedtime Overall  Glucose range:       Averages: 129 188  207  197    POST-MEAL PC Breakfast PC Lunch PC Dinner  Glucose range:  Meal times are: Breakfast around 7-8 AM, 1-2 PM and 6 PM                Dietician visit, most recent: 9/18 CDE visit: 01/2020 for pump start  Weight history: Previous range 219-230  Wt Readings from Last 3 Encounters:  04/16/22 236 lb (107 kg)  03/26/22 230 lb (104.3 kg)  03/13/22 235 lb (106.6 kg)    Glycemic control:    Lab Results  Component Value Date   HGBA1C 9.8 (A) 04/16/2022   HGBA1C 12.0 (H) 01/23/2022   HGBA1C 13.7 (H) 10/15/2021   Lab Results  Component Value Date   MICROALBUR 0.9 11/17/2021   LDLCALC 67 06/02/2021   CREATININE 0.86 04/14/2022   Lab Results  Component Value Date   MICRALBCREAT 36 (H) 11/17/2021    Lab Results  Component Value Date   FRUCTOSAMINE 255 04/09/2020   FRUCTOSAMINE 298 (H) 01/30/2020   FRUCTOSAMINE CANCELED 07/26/2015       Allergies as of 04/16/2022       Reactions   Codeine Swelling, Other (See Comments)   Swelling and burning of mouth (inside)   [place] [dapagliflozin] Other (See Comments)   Recurrent UTI with farxiga   Lactose Intolerance (gi) Nausea And Vomiting        Medication List        Accurate as of April 16, 2022 11:52 AM. If you have any questions, ask your nurse or doctor.          amitriptyline 25 MG tablet Commonly known as: ELAVIL Take 2-3 tablets (50-75 mg total) by mouth at bedtime.   baclofen 10 MG tablet Commonly known as: LIORESAL TAKE 1 TABLET BY MOUTH THREE TIMES A DAY AS NEEDED FOR MUSCLE SPASMS   BD Pen Needle Nano 2nd Gen 32G X 4 MM Misc Generic drug: Insulin Pen Needle USE WITH HUMALOG TO FILL PUMP DAILY   bisacodyl 5 MG EC tablet Commonly known as: DULCOLAX Take 1 tablet (5 mg total) by mouth daily as needed for moderate constipation.   blood glucose meter kit and supplies Kit Dispense based on patient and insurance preference. Use up to four times daily as directed. (FOR E11.65 Z79.4).   BLOOD GLUCOSE TEST STRIPS Strp Use as directed to monitor blood sugar up to 4x a day FOR E11.65 Z79.4   butalbital-acetaminophen-caffeine 50-325-40 MG tablet Commonly known as: FIORICET Take 1-2 tablets by mouth every 6 (six) hours as needed for headache.   carvedilol 25 MG tablet Commonly known as: COREG Take 25 mg by mouth 2 (two) times daily.   cholecalciferol 25 MCG (1000 UNIT) tablet Commonly known as: VITAMIN D3 Take 1,000 Units by mouth daily.   ciclopirox 8 % solution Commonly known as: PENLAC Apply 1 Application topically at bedtime.   cloNIDine 0.1 MG tablet Commonly known as: CATAPRES Take 0.1 mg by mouth daily as needed (For blood pressure overm160/90).   clopidogrel 75 MG tablet Commonly known as: PLAVIX Take 1 tablet (75 mg total) by mouth daily.   COVID-19 At Home Antigen Test Kit Use as directed.   cycloSPORINE 0.05 %  ophthalmic emulsion Commonly known as: RESTASIS Place 2 drops into both eyes 2 (two) times daily.   Dexcom G6 Sensor Misc Use to monitor blood sugar, change after 10 days   DULoxetine 60 MG capsule Commonly known as: Cymbalta Take 1 capsule (60 mg total) by mouth daily.   EPINEPHrine 0.3 mg/0.3 mL Soaj injection Commonly known as: EPI-PEN Inject 0.3 mg  into the muscle as needed for anaphylaxis.   ezetimibe 10 MG tablet Commonly known as: ZETIA Take 1 tablet (10 mg total) by mouth daily.   furosemide 20 MG tablet Commonly known as: LASIX Take 20 mg by mouth.   gabapentin 600 MG tablet Commonly known as: Neurontin 600 mg qAM, 600 mg qPM, 1200 mg QHS   Gvoke HypoPen 2-Pack 1 MG/0.2ML Soaj Generic drug: Glucagon Inject 1 mg into the skin once as needed for up to 1 dose (hypoglycemia). Only for severe low blood sugar not responding to drinking juice/glucose tablets, when CBG is <70   insulin lispro 100 UNIT/ML KwikPen Commonly known as: HumaLOG KwikPen Max daily dose 160 units   Lancets Misc Use as directed to monitor blood sugar up to 4x a day FOR E11.65 Z79.4   levETIRAcetam 500 MG tablet Commonly known as: KEPPRA Take 500-750 mg by mouth See admin instructions. Take 1 tablet (500mg ) by mouth every morning and take 1 tablets by mouth (750mg ) by mouth every night   mirabegron ER 50 MG Tb24 tablet Commonly known as: Myrbetriq Take 1 tablet (50 mg total) by mouth daily.   olmesartan 40 MG tablet Commonly known as: BENICAR Take 1 tablet (40 mg total) by mouth daily.   Omnipod 5 G6 Pods (Gen 5) Misc 1 Device by Does not apply route every 3 (three) days.   pantoprazole 40 MG tablet Commonly known as: PROTONIX Take 1 tablet (40 mg total) by mouth daily.   polyethylene glycol 17 g packet Commonly known as: MIRALAX / GLYCOLAX Take 17 g by mouth daily as needed for mild constipation.   rosuvastatin 10 MG tablet Commonly known as: CRESTOR Take 1 tablet (10 mg total)  by mouth daily.   senna-docusate 8.6-50 MG tablet Commonly known as: Senokot-S Take 1 tablet by mouth at bedtime as needed for mild constipation.   sodium phosphate 7-19 GM/118ML Enem Place 133 mLs (1 enema total) rectally daily as needed for severe constipation.   Toujeo Max SoloStar 300 UNIT/ML Solostar Pen Generic drug: insulin glargine (2 Unit Dial) 70 units in am   Trulicity 4.5 0000000 Sopn Generic drug: Dulaglutide Inject 4.5 mg as directed once a week.   Wixela Inhub 250-50 MCG/ACT Aepb Generic drug: fluticasone-salmeterol Inhale 1 puff into the lungs 2 (two) times daily.        Allergies:  Allergies  Allergen Reactions   Codeine Swelling and Other (See Comments)    Swelling and burning of mouth (inside)   [place] [Dapagliflozin] Other (See Comments)    Recurrent UTI with farxiga   Lactose Intolerance (Gi) Nausea And Vomiting    Past Medical History:  Diagnosis Date   Anxiety and depression 09/13/2006   Qualifier: Diagnosis of  By: Hassell Done [name], Nykedtra     Arthritis    joint pain    Asthma    COPD (chronic obstructive pulmonary disease) ([place])    chronic bronchitis    Depression    Diabetes mellitus    since age 34; type 2 IDDM   Diabetic neuropathy, painful ([place])    FEET AND HANDS   Diabetic retinopathy ([place])    Diastolic dysfunction    a.  Echo 11/17: EF 60-65%, mild LVH, no RWMA, Gr1DD, mild AI, dilated aortic root measuring 38 mm, mildly dilated ascending aorta   Dilated aortic root ([facility])    73mm by echo 11/2015   Dizziness    secondary to diabetes and hypertension    Epilepsy idiopathic petit mal ([place])  last seizure 2012;controlled w/ topomax   GERD (gastroesophageal reflux disease)    Glaucoma    NOT ON ANY EYE DROPS    Headache(784.0)    migraines    Heart murmur    born with    History of stress test    a. 11/17: Normal perfusion, EF 53%, normal study   Hx of blood clots    hematomas removed from left side of brain from 75mos to  57yrs old    Hyperlipidemia    Hypertension    Legally blind    left eye    MIGRAINE HEADACHE 09/14/2006   Qualifier: Diagnosis of  By: Hassell Done [name], Nykedtra     Myocardial infarction [facility])    a.  Patient reported history of without objective documentation   Nephrolithiasis    frequent urination , urination at nite  PT SEEN IN ER 09/22/11 FOR BACK AND RT SIDED PAIN--HAS KNOWN STONE RT URETER AND UA IN ER SHOWED UTI   Pain 09/23/2011   LOWER BACK AND RIGHT SIDE--PT HAS RIGHT URETERAL STONE   Pneumonia    hx of 2009   Pyelonephritis    SBO (small bowel obstruction) ([place]) 04/23/2021   Seizures ([place])    Sleep apnea    [facility];does not use Cpap ; mild   Small bowel obstruction ([place]) 05/18/2021   Stress incontinence    Stroke ([place])    last 2003  RESIDUAL LEFT LEG WEAKNESS--NO OTHER RESIDUAL PROBLEMS    Past Surgical History:  Procedure Laterality Date   BRAIN HEMATOMA EVACUATION     five procedures total, first procedure when 80 months old, last at 57 years of age.   CYSTOSCOPY W/ RETROGRADES  09/24/2011   Procedure: CYSTOSCOPY WITH RETROGRADE PYELOGRAM;  Surgeon: Malka So, MD;  Location: [facility];  Service: Urology;  Laterality: Right;   CYSTOSCOPY WITH URETEROSCOPY  09/24/2011   Procedure: CYSTOSCOPY WITH URETEROSCOPY;  Surgeon: Malka So, MD;  Location: [facility];  Service: Urology;  Laterality: Right;  Balloon dilation right ureter    CYSTOSCOPY/RETROGRADE/URETEROSCOPY  08/24/2011   Procedure: CYSTOSCOPY/RETROGRADE/URETEROSCOPY;  Surgeon: Molli Hazard, MD;  Location: [facility];  Service: Urology;  Laterality: Right;  Cysto, Right retrograde Pyelogram, right stent placement.    INCISIONAL HERNIA REPAIR  05/05/2011   Procedure: LAPAROSCOPIC INCISIONAL HERNIA;  Surgeon: Rolm Bookbinder, MD;  Location: [place];  Service: General;  Laterality: N/A;   kidney stone removal     LAPAROTOMY N/A 06/12/2021   Procedure: EXPLORATORY LAPAROTOMY, LYSIS OF ADHESIONS;   Surgeon: Olean Ree, MD;  Location: [facility];  Service: General;  Laterality: N/A;   MASS EXCISION  05/05/2011   Procedure: EXCISION MASS;  Surgeon: Rolm Bookbinder, MD;  Location: [place];  Service: General;  Laterality: Right;   PCNL     RETINAL DETACHMENT SURGERY Left 1990   RIGHT/LEFT HEART CATH AND CORONARY ANGIOGRAPHY N/A 04/28/2019   Procedure: RIGHT/LEFT HEART CATH AND CORONARY ANGIOGRAPHY;  Surgeon: Yolonda Kida, MD;  Location: [facility];  Service: Cardiovascular;  Laterality: N/A;   SHUNT REMOVAL     shunt inserted at age 4 removed at age 74    [place]    Family History  Problem Relation Age of Onset   Uterine cancer Mother    Hypertension Mother    Cancer Mother    Brain cancer Maternal Grandmother    Hypertension Maternal Grandmother    Birth defects Daughter    Hypertension  Daughter    Cirrhosis Maternal Grandfather    ADD / ADHD Maternal Grandfather    Birth defects Maternal Grandfather    Diabetes Maternal Grandfather    Anesthesia problems Neg Hx    Colon cancer Neg Hx    Esophageal cancer Neg Hx    Pancreatic cancer Neg Hx     Social History:  reports that she quit smoking about 35 years ago. Her smoking use included cigarettes. She has a 15.00 pack-year smoking history. She has never used smokeless tobacco. She reports current alcohol use of about 1.0 standard drink of alcohol per week. She reports that she does not use drugs.   Review of Systems   Lipid history: Is being treated with simvastatin 40 mg daily    Lab Results  Component Value Date   CHOL 128 06/02/2021   HDL 41 (L) 06/02/2021   LDLCALC 67 06/02/2021   TRIG 71 06/19/2021   CHOLHDL 3.1 06/02/2021           Hypertension: She has had severe hypertension  This has been managed by her cardiologist on a regular basis   She does have an adrenal adenoma  Not sure if she has had evaluation for this elsewhere Previously in the hospital her aldosterone  level was normal  BP Readings from Last 3 Encounters:  04/16/22 124/80  03/13/22 (!) 150/90  02/25/22 (!) 183/110   Hypokalemia: She has been given  25 mg spironolactone for history of hypokalemia and no potassium supplements  Lab Results  Component Value Date   K 4.1 04/14/2022    Diabetic complications: History of neuropathy, microalbuminuria and background retinopathy  She will occasionally take prednisone for her asthma  LABS:    Physical Examination:  BP 124/80 (BP Location: Left Arm, Patient Position: Sitting, Cuff Size: Large)   Pulse 76   Ht 5\' 6"  (1.676 m)   Wt 236 lb (107 kg)   LMP  (LMP Unknown)   SpO2 99%   BMI 38.09 kg/m       ASSESSMENT:  Diabetes type 2, uncontrolled, insulin-dependent  See history of present illness for detailed discussion of current diabetes management, blood sugar patterns and problems identified     Her A1c is 9.8 compared to 12%  She is on insulin with TOUJEO and Humalog Continues to be on Trulicity 4.5 mg weekly  Her blood sugars on her Accu-Chek monitoring are still about the same as on her last visit She is reluctant to take enough insulin to bring her sugars to goal Again because of fear of hypoglycemia she will not take enough mealtime coverage especially at dinnertime  She is again not adjusting her mealtime coverage based on what she is eating but mostly on her blood sugar level before the meal and checking several times a day  Hypertension: Appears better now    PLAN:    She will continue her regimen unchanged unless blood sugars start getting higher She will take at least 2 units more than the sliding scale that she is following at dinnertime for the Humalog Consider restarting Farxiga to improve with control, renal and cardiac benefits and weight loss  Prescription for Dexcom sent to [place] and she will follow-up on this  There are no Patient Instructions on file for this visit.     Elayne Snare 04/16/2022, 11:52 AM   Note: This office note was prepared with Dragon voice recognition system technology. Any transcriptional errors that result from this process are unintentional.

## 2022-04-16 NOTE — Patient Instructions (Signed)
Take 2 more Humalog at spper

## 2022-04-20 DIAGNOSIS — Z8673 Personal history of transient ischemic attack (TIA), and cerebral infarction without residual deficits: Secondary | ICD-10-CM | POA: Diagnosis not present

## 2022-04-20 DIAGNOSIS — I10 Essential (primary) hypertension: Secondary | ICD-10-CM | POA: Diagnosis not present

## 2022-04-20 DIAGNOSIS — J449 Chronic obstructive pulmonary disease, unspecified: Secondary | ICD-10-CM | POA: Diagnosis not present

## 2022-04-20 DIAGNOSIS — G4733 Obstructive sleep apnea (adult) (pediatric): Secondary | ICD-10-CM | POA: Diagnosis not present

## 2022-04-23 ENCOUNTER — Telehealth: Payer: Self-pay

## 2022-04-23 NOTE — Telephone Encounter (Signed)
Pt contacted and advised [facility] pharmacy to process rx. Pt states she is not comfortable getting back on the Dexcom CGM. She is worried about being hospitalized again. Se would prefer to continue how she is checking her sugars at this time.

## 2022-04-23 NOTE — Telephone Encounter (Signed)
Requesting pt contact pharmacy for Dexcom rx.

## 2022-05-06 ENCOUNTER — Ambulatory Visit: Payer: 59 | Admitting: Student in an Organized Health Care Education/Training Program

## 2022-05-07 ENCOUNTER — Ambulatory Visit: Payer: Self-pay

## 2022-05-07 NOTE — Patient Instructions (Signed)
Visit Information  Thank you for taking time to visit with me today. Please don't hesitate to contact me if I can be of assistance to you.   Following are the goals we discussed today:   Goals Addressed               This Visit's Progress     COMPLETED: "I dont drive and my daughter works every day" (pt-stated)        Current Barriers:  Legally blind and unable to receive drivers license  Nurse Case Manager Clinical Goal(s): Over the next 7 days, patient will engage with [facility] SW to discuss transportation resources for [employer]  Interventions:  Assessed for completion of previously provided [employer] application Referral to [employer] SW  Please see past updates related to this goal by clicking on the "Past Updates" button in the selected goal   Care Coordination Interventions: Patient has decided not to follow up with [employer] for [place] application and she will be moving to [place] over the summer. Patient will continue to use transportation provided by her insurance.         If you are experiencing a Mental Health or Behavioral Health Crisis or need someone to talk to, please call 911  Patient verbalizes understanding of instructions and care plan provided today and agrees to view in MyChart. Active MyChart status and patient understanding of how to access instructions and care plan via MyChart confirmed with patient.     No further follow up required:    Lysle Morales, BSW Social Worker [facility] Care Management  [phone]

## 2022-05-07 NOTE — Patient Outreach (Signed)
  Care Coordination   Follow Up Visit Note   05/07/2022 Name: Sherese Heyward MRN: [id number] DOB: 07-20-1965  Miche Loughridge is a 57 y.o. year old female who sees [name], [place] for primary care. I spoke with  [name] by phone today.  What matters to the patients health and wellness today?  Patient will be moving to [place] this summer and will not need [employer] program for transportation.    Goals Addressed               This Visit's Progress     COMPLETED: "I dont drive and my daughter works every day" (pt-stated)        Current Barriers:  Legally blind and unable to receive drivers license  Nurse Case Manager Clinical Goal(s): Over the next 7 days, patient will engage with [facility] SW to discuss transportation resources for [employer]  Interventions:  Assessed for completion of previously provided [employer] application Referral to [employer] SW  Please see past updates related to this goal by clicking on the "Past Updates" button in the selected goal   Care Coordination Interventions: Patient has decided not to follow up with [employer] for [place] application and she will be moving to [place] over the summer. Patient will continue to use transportation provided by her insurance.         SDOH assessments and interventions completed:  No     Care Coordination Interventions:  No, not indicated   Follow up plan: No further intervention required.   Encounter Outcome:  Pt. Visit Completed   Lysle Morales, BSW Social Worker [facility] Care Management  [phone]

## 2022-05-21 ENCOUNTER — Encounter: Payer: Self-pay | Admitting: Endocrinology

## 2022-05-21 ENCOUNTER — Encounter: Payer: Self-pay | Admitting: Student in an Organized Health Care Education/Training Program

## 2022-05-21 DIAGNOSIS — E1165 Type 2 diabetes mellitus with hyperglycemia: Secondary | ICD-10-CM

## 2022-05-22 ENCOUNTER — Other Ambulatory Visit: Payer: Self-pay | Admitting: Family Medicine

## 2022-05-22 ENCOUNTER — Other Ambulatory Visit: Payer: Self-pay | Admitting: Physician Assistant

## 2022-05-22 ENCOUNTER — Other Ambulatory Visit: Payer: Self-pay | Admitting: *Deleted

## 2022-05-22 DIAGNOSIS — E119 Type 2 diabetes mellitus without complications: Secondary | ICD-10-CM

## 2022-05-22 DIAGNOSIS — E1165 Type 2 diabetes mellitus with hyperglycemia: Secondary | ICD-10-CM

## 2022-05-22 DIAGNOSIS — E162 Hypoglycemia, unspecified: Secondary | ICD-10-CM

## 2022-05-22 MED ORDER — INSULIN LISPRO (1 UNIT DIAL) 100 UNIT/ML (KWIKPEN)
PEN_INJECTOR | SUBCUTANEOUS | 2 refills | Status: DC
Start: 2022-05-22 — End: 2022-06-24

## 2022-05-22 MED ORDER — TOUJEO MAX SOLOSTAR 300 UNIT/ML ~~LOC~~ SOPN
PEN_INJECTOR | SUBCUTANEOUS | 3 refills | Status: DC
Start: 2022-05-22 — End: 2022-06-30

## 2022-05-22 NOTE — Telephone Encounter (Signed)
Requested medication (s) are due for refill today - provider review   0.44ml Requested medication (s) are on the active medication list -yes  Future visit scheduled -no  Last refill: 02/17/22  Notes to clinic: off protocol- provider review   Requested Prescriptions  Pending Prescriptions Disp Refills   GVOKE HYPOPEN 2-PACK 1 MG/0.2ML SOAJ [Pharmacy Med Name: GVOKE HYPOPEN 2-PK 1 MG/0.2 ML]      Sig: Inject 1 mg into the skin once as needed for up to 1 dose (hypoglycemia). Only for severe low blood sugar not responding to drinking juice/glucose tablets, when CBG is <70     Off-Protocol Failed - 05/22/2022  6:51 AM      Failed - Medication not assigned to a protocol, review manually.      Passed - Valid encounter within last 12 months    Recent Outpatient Visits           3 months ago Uncontrolled type 2 diabetes mellitus with hyperglycemia [facility])   [facility] [name], [name]   5 months ago Uncontrolled type 2 diabetes mellitus with hyperglycemia [facility])   [facility] [name], [name]   5 months ago Uncontrolled type 2 diabetes mellitus with hyperglycemia [facility])   [facility] [name], [name]   6 months ago Encounter for examination following treatment at hospital   [facility] [name], [name]   8 months ago Erroneous encounter - disregard   [facility] Berniece Salines, [name]                 Requested Prescriptions  Pending Prescriptions Disp Refills   GVOKE HYPOPEN 2-PACK 1 MG/0.2ML SOAJ [Pharmacy Med Name: GVOKE HYPOPEN 2-PK 1 MG/0.2 ML]      Sig: Inject 1 mg into the skin once as needed for up to 1 dose (hypoglycemia). Only for severe low blood sugar not responding to drinking juice/glucose tablets, when CBG is <70     Off-Protocol Failed - 05/22/2022  6:51 AM      Failed - Medication not assigned to a protocol, review manually.       Passed - Valid encounter within last 12 months    Recent Outpatient Visits           3 months ago Uncontrolled type 2 diabetes mellitus with hyperglycemia [facility])   [facility] [name], [name]   5 months ago Uncontrolled type 2 diabetes mellitus with hyperglycemia [facility])   [facility] [name], [name]   5 months ago Uncontrolled type 2 diabetes mellitus with hyperglycemia [facility])   [facility] [name], [name]   6 months ago Encounter for examination following treatment at hospital   [facility] [name], [name]   8 months ago Erroneous encounter - disregard   [facility] Berniece Salines, [name]

## 2022-05-25 MED ORDER — BUTALBITAL-APAP-CAFFEINE 50-325-40 MG PO TABS: 1.0000 | ORAL_TABLET | Freq: Four times a day (QID) | ORAL | 0 refills | Status: DC | PRN

## 2022-05-26 ENCOUNTER — Other Ambulatory Visit: Payer: Self-pay | Admitting: Student in an Organized Health Care Education/Training Program

## 2022-05-26 MED ORDER — BUTALBITAL-APAP-CAFFEINE 50-325-40 MG PO TABS: 1.0000 | ORAL_TABLET | Freq: Four times a day (QID) | ORAL | 0 refills | Status: DC | PRN

## 2022-05-27 ENCOUNTER — Other Ambulatory Visit: Payer: Self-pay | Admitting: Physician Assistant

## 2022-05-27 DIAGNOSIS — I639 Cerebral infarction, unspecified: Secondary | ICD-10-CM | POA: Diagnosis not present

## 2022-05-27 DIAGNOSIS — R200 Anesthesia of skin: Secondary | ICD-10-CM

## 2022-05-27 DIAGNOSIS — R531 Weakness: Secondary | ICD-10-CM

## 2022-05-27 DIAGNOSIS — R202 Paresthesia of skin: Secondary | ICD-10-CM | POA: Diagnosis not present

## 2022-05-27 DIAGNOSIS — R569 Unspecified convulsions: Secondary | ICD-10-CM | POA: Diagnosis not present

## 2022-06-03 DIAGNOSIS — I10 Essential (primary) hypertension: Secondary | ICD-10-CM | POA: Diagnosis not present

## 2022-06-03 DIAGNOSIS — J449 Chronic obstructive pulmonary disease, unspecified: Secondary | ICD-10-CM | POA: Diagnosis not present

## 2022-06-03 DIAGNOSIS — Z8673 Personal history of transient ischemic attack (TIA), and cerebral infarction without residual deficits: Secondary | ICD-10-CM | POA: Diagnosis not present

## 2022-06-03 DIAGNOSIS — G4733 Obstructive sleep apnea (adult) (pediatric): Secondary | ICD-10-CM | POA: Diagnosis not present

## 2022-06-10 ENCOUNTER — Telehealth: Payer: Self-pay | Admitting: Student in an Organized Health Care Education/Training Program

## 2022-06-10 ENCOUNTER — Ambulatory Visit
Admission: RE | Admit: 2022-06-10 | Discharge: 2022-06-10 | Disposition: A | Discharge status: Home / Self Care | Payer: 59 | Source: Ambulatory Visit | Attending: Physician Assistant | Admitting: Physician Assistant

## 2022-06-10 DIAGNOSIS — R531 Weakness: Secondary | ICD-10-CM | POA: Insufficient documentation

## 2022-06-10 DIAGNOSIS — R202 Paresthesia of skin: Secondary | ICD-10-CM | POA: Insufficient documentation

## 2022-06-10 DIAGNOSIS — I639 Cerebral infarction, unspecified: Secondary | ICD-10-CM | POA: Diagnosis not present

## 2022-06-10 DIAGNOSIS — I6389 Other cerebral infarction: Secondary | ICD-10-CM | POA: Diagnosis not present

## 2022-06-10 DIAGNOSIS — I63233 Cerebral infarction due to unspecified occlusion or stenosis of bilateral carotid arteries: Secondary | ICD-10-CM | POA: Diagnosis not present

## 2022-06-10 DIAGNOSIS — R200 Anesthesia of skin: Secondary | ICD-10-CM | POA: Insufficient documentation

## 2022-06-10 DIAGNOSIS — H0520 Unspecified exophthalmos: Secondary | ICD-10-CM | POA: Diagnosis not present

## 2022-06-10 DIAGNOSIS — G319 Degenerative disease of nervous system, unspecified: Secondary | ICD-10-CM | POA: Diagnosis not present

## 2022-06-10 NOTE — Telephone Encounter (Signed)
PT stated that Gabapentin isn't helping with the pain. PT stated that she had spoken with the doctor about Gabapentin before. Please give patient a call. TY

## 2022-06-11 NOTE — Telephone Encounter (Signed)
Patient is currently taking Cymbalta 60 mg for depression.  She has taken Lyrica in the past and felt like it did help her.  It was a long time ago and she does not remember the dose.  She has never tried the alpha lipoic acid.

## 2022-06-14 ENCOUNTER — Other Ambulatory Visit: Payer: Self-pay | Admitting: Endocrinology

## 2022-06-16 ENCOUNTER — Other Ambulatory Visit: Payer: Self-pay

## 2022-06-16 ENCOUNTER — Emergency Department: Payer: 59

## 2022-06-16 ENCOUNTER — Emergency Department
Admission: EM | Admit: 2022-06-16 | Discharge: 2022-06-16 | Disposition: A | Discharge status: Home / Self Care | Payer: 59 | Attending: Emergency Medicine | Admitting: Emergency Medicine

## 2022-06-16 DIAGNOSIS — E860 Dehydration: Secondary | ICD-10-CM | POA: Diagnosis not present

## 2022-06-16 DIAGNOSIS — E119 Type 2 diabetes mellitus without complications: Secondary | ICD-10-CM | POA: Diagnosis not present

## 2022-06-16 DIAGNOSIS — R42 Dizziness and giddiness: Secondary | ICD-10-CM | POA: Insufficient documentation

## 2022-06-16 DIAGNOSIS — R4182 Altered mental status, unspecified: Secondary | ICD-10-CM | POA: Diagnosis not present

## 2022-06-16 DIAGNOSIS — J449 Chronic obstructive pulmonary disease, unspecified: Secondary | ICD-10-CM | POA: Insufficient documentation

## 2022-06-16 DIAGNOSIS — I509 Heart failure, unspecified: Secondary | ICD-10-CM | POA: Insufficient documentation

## 2022-06-16 DIAGNOSIS — R29818 Other symptoms and signs involving the nervous system: Secondary | ICD-10-CM | POA: Diagnosis not present

## 2022-06-16 LAB — BLOOD GAS, VENOUS
Acid-Base Excess: 0.6 mmol/L (ref 0.0–2.0)
Bicarbonate: 26.6 mmol/L (ref 20.0–28.0)
O2 Saturation: 66.6 %
Patient temperature: 37
pCO2, Ven: 47 mmHg (ref 44–60)
pH, Ven: 7.36 (ref 7.25–7.43)
pO2, Ven: 41 mmHg (ref 32–45)

## 2022-06-16 LAB — COMPREHENSIVE METABOLIC PANEL
ALT: 18 U/L (ref 0–44)
AST: 14 U/L — ABNORMAL LOW (ref 15–41)
Albumin: 3.4 g/dL — ABNORMAL LOW (ref 3.5–5.0)
Alkaline Phosphatase: 82 U/L (ref 38–126)
Anion gap: 7 (ref 5–15)
BUN: 26 mg/dL — ABNORMAL HIGH (ref 6–20)
CO2: 26 mmol/L (ref 22–32)
Calcium: 9.1 mg/dL (ref 8.9–10.3)
Chloride: 103 mmol/L (ref 98–111)
Creatinine, Ser: 1.14 mg/dL — ABNORMAL HIGH (ref 0.44–1.00)
GFR, Estimated: 56 mL/min — ABNORMAL LOW (ref >60)
Glucose, Bld: 323 mg/dL — ABNORMAL HIGH (ref 70–99)
Potassium: 4.4 mmol/L (ref 3.5–5.1)
Sodium: 136 mmol/L (ref 135–145)
Total Bilirubin: 0.3 mg/dL (ref 0.3–1.2)
Total Protein: 6.9 g/dL (ref 6.5–8.1)

## 2022-06-16 LAB — CBC
HCT: 39.7 % (ref 36.0–46.0)
Hemoglobin: 12.3 g/dL (ref 12.0–15.0)
MCH: 28.9 pg (ref 26.0–34.0)
MCHC: 31 g/dL (ref 30.0–36.0)
MCV: 93.4 fL (ref 80.0–100.0)
Platelets: 248 10*3/uL (ref 150–400)
RBC: 4.25 MIL/uL (ref 3.87–5.11)
RDW: 13.7 % (ref 11.5–15.5)
WBC: 5.4 10*3/uL (ref 4.0–10.5)
nRBC: 0 % (ref 0.0–0.2)

## 2022-06-16 LAB — BETA-HYDROXYBUTYRIC ACID: Beta-Hydroxybutyric Acid: 0.27 mmol/L (ref 0.05–0.27)

## 2022-06-16 LAB — DIFFERENTIAL
Abs Immature Granulocytes: 0.01 10*3/uL (ref 0.00–0.07)
Basophils Absolute: 0 10*3/uL (ref 0.0–0.1)
Basophils Relative: 0 %
Eosinophils Absolute: 0 10*3/uL (ref 0.0–0.5)
Eosinophils Relative: 1 %
Immature Granulocytes: 0 %
Lymphocytes Relative: 29 %
Lymphs Abs: 1.6 10*3/uL (ref 0.7–4.0)
Monocytes Absolute: 0.4 10*3/uL (ref 0.1–1.0)
Monocytes Relative: 7 %
Neutro Abs: 3.4 10*3/uL (ref 1.7–7.7)
Neutrophils Relative %: 63 %

## 2022-06-16 LAB — PROTIME-INR
INR: 1 (ref 0.8–1.2)
Prothrombin Time: 13.1 seconds (ref 11.4–15.2)

## 2022-06-16 LAB — TROPONIN I (HIGH SENSITIVITY)
Troponin I (High Sensitivity): 8 ng/L (ref <18)
Troponin I (High Sensitivity): 6 ng/L (ref <18)

## 2022-06-16 LAB — ETHANOL: Alcohol, Ethyl (B): <10 mg/dL (ref <10)

## 2022-06-16 LAB — CBG MONITORING, ED: Glucose-Capillary: 273 mg/dL — ABNORMAL HIGH (ref 70–99)

## 2022-06-16 LAB — APTT: aPTT: 26 seconds (ref 24–36)

## 2022-06-16 MED ORDER — SODIUM CHLORIDE 0.9 % IV BOLUS
1000.0000 mL | Freq: Once | INTRAVENOUS | Status: AC
  Administered 2022-06-16: 1000 mL via INTRAVENOUS

## 2022-06-16 MED ORDER — MECLIZINE HCL 25 MG PO TABS
25.0000 mg | ORAL_TABLET | Freq: Once | ORAL | Status: AC
  Administered 2022-06-16: 25 mg via ORAL
  Filled 2022-06-16: qty 1

## 2022-06-16 MED ORDER — MECLIZINE HCL 25 MG PO TABS: 25.0000 mg | ORAL_TABLET | Freq: Three times a day (TID) | ORAL | 0 refills | Status: AC | PRN

## 2022-06-16 NOTE — ED Notes (Signed)
Pt in CT.

## 2022-06-16 NOTE — ED Notes (Signed)
Introduced self to pt. Pt is somnolent but rouses to voice and answers questions appropriately. Respirations unlabored. NSR on monitor. Pt states she woke up feeling dizzy and lethargic this morning. Went to bed at 10pm last night.

## 2022-06-16 NOTE — ED Provider Notes (Signed)
[facility] Provider Note    Event Date/Time   First MD Initiated Contact with Patient 06/16/22 [phone]     (approximate)   History   Dizziness   HPI  [name] is a 57 y.o. female with history of T2DM, prior CVA, seizures, CHF, COPD presenting to the emergency department for evaluation of dizziness.  Patient reports that she felt fine when she went to bed last night around 10 PM.  This morning, she woke up and felt dizzy described both as lightheadedness and a spinning sensation.  She took an Pharmacist, community to get to her podiatry appointment, but when she got out she had worsening dizziness and felt like she was going to pass out leading her to present to the ER.  She does feel like her speech is slurred.  States that she has had multiple prior episodes that she attributes to both high and low glucose levels.  Reports that her blood glucose is normally around 200-300.      Physical Exam   Triage Vital Signs: ED Triage Vitals  Enc Vitals Group     BP 06/16/22 0753 93/62     Pulse Rate 06/16/22 0753 72     Resp 06/16/22 0753 20     Temp 06/16/22 0757 97.7 F (36.5 C)     Temp src --      SpO2 06/16/22 0753 95 %     Weight 06/16/22 0754 230 lb (104.3 kg)     Height 06/16/22 0754 5\' 6"  (1.676 m)     Head Circumference --      Peak Flow --      Pain Score 06/16/22 0753 4     Pain Loc --      Pain Edu? --      Excl. in GC? --     Most recent vital signs: Vitals:   06/16/22 1222 06/16/22 1225  BP: 96/62 98/63  Pulse: 71 69  Resp: 18 15  Temp:    SpO2: 95% 97%     General: Awake, interactive  CV:  Regular rate, good peripheral perfusion.  Resp:  Lungs clear, unlabored respirations.  Abd:  Soft, nondistended.  Neuro:  Keenly aware, correctly answers month and age, able to blink eyes and squeeze hands, normal horizontal extraocular movements, no visual field loss, normal facial symmetry, no arm or leg motor drift, no limb ataxia, normal  sensation, no aphasia, mild dysarthria noted, no inattention   ED Results / Procedures / Treatments   Labs (all labs ordered are listed, but only abnormal results are displayed) Labs Reviewed  COMPREHENSIVE METABOLIC PANEL - Abnormal; Notable for the following components:      Result Value   Glucose, Bld 323 (*)    BUN 26 (*)    Creatinine, Ser 1.14 (*)    Albumin 3.4 (*)    AST 14 (*)    GFR, Estimated 56 (*)    All other components within normal limits  CBG MONITORING, ED - Abnormal; Notable for the following components:   Glucose-Capillary 273 (*)    All other components within normal limits  PROTIME-INR  APTT  CBC  DIFFERENTIAL  ETHANOL  BETA-HYDROXYBUTYRIC ACID  BLOOD GAS, VENOUS  TROPONIN I (HIGH SENSITIVITY)  TROPONIN I (HIGH SENSITIVITY)     EKG EKG independently reviewed interpreted by myself (ER attending) demonstrates:    RADIOLOGY Imaging independently reviewed and interpreted by myself demonstrates:    PROCEDURES:  Critical Care performed: No  Procedures   MEDICATIONS ORDERED IN ED: Medications  sodium chloride 0.9 % bolus 1,000 mL (0 mLs Intravenous Stopped 06/16/22 0959)  meclizine (ANTIVERT) tablet 25 mg (25 mg Oral Given 06/16/22 1026)     IMPRESSION / MDM / ASSESSMENT AND PLAN / ED COURSE  I reviewed the triage vital signs and the nursing notes.  Differential diagnosis includes, but is not limited to, DKA, anemia, electrolyte abnormality, vertigo, intracranial bleed, stroke  Patient's presentation is most consistent with acute presentation with potential threat to life or bodily function.  56 year old female presenting to the emergency department for evaluation of dizziness with associated dysarthria.  Hyperglycemic on presentation, but blood work not reflective of DKA. She does have an mild acute kidney injury with a creatinine of 1.14 up from 0.86.  She does have a history of CHF, but clinically appears dry.  Does also have borderline  hypotension here, will trial fluid resuscitation and watch blood pressure.  On reevaluation, blood pressure is improved.  Patient was able to ambulate with steady gait, but did report ongoing dizziness both at rest and with movement.  Case was reviewed with Dr. Iver Nestle with neurology.  She reviewed the patient's recent MRI.  She did not feel that patient needed repeat imaging at this time and did feel that she could be discharged with strict return precautions and outpatient follow-up.  Discussed with patient who is comfortable with this plan.  Strict return precautions provided.  Patient discharged in stable condition.     FINAL CLINICAL IMPRESSION(S) / ED DIAGNOSES   Final diagnoses:  Dizziness  Dehydration     Rx / DC Orders   ED Discharge Orders          Ordered    meclizine (ANTIVERT) 25 MG tablet  3 times daily PRN        06/16/22 1229             Note:  This document was prepared using Dragon voice recognition software and may include unintentional dictation errors.   Trinna Post, MD 06/16/22 [phone]

## 2022-06-16 NOTE — Discharge Instructions (Signed)
You were seen in the emergency department today for evaluation of your dizziness.  Your lab work and CT were overall reassuring.  Your kidney function was slightly worse than prior which I suspect was because you were slightly dehydrated.  Your blood pressure was initially low, but did improve with fluids.  Please keep a log of your blood pressure medication and discuss with your primary care doctor.  If your blood pressure remains low, they may wish to adjust your medication.  I suspect your dizziness may be related to something called vertigo.  I have sent a prescription for meclizine to your pharmacy that you can take as needed.  Follow-up with your primary care doctor within the next few days for reevaluation of your symptoms.  Return to the ER for any new or worsening symptoms.

## 2022-06-16 NOTE — ED Notes (Signed)
Pt ambulated to toilet using cane. Did not experience increased dizziness. States feels "off balance" more than usual but no unilateral weakness noted. Pt resting comfortably in bed now.

## 2022-06-16 NOTE — ED Triage Notes (Signed)
Pt to ED from [facility] for dizziness and generalized weakness. States got out of uber at 7 am today and felt like was going to pass out. Reports woke up with dizziness. Went to bed at 2200 last night.  Reports feels like her speech is slurred.  Equal grip and strength. Face symmetrical.  C/o right leg and right foot pain.  Also reports blood sugar has been elevated and does not feel good when blood sugar is elevated.

## 2022-06-17 ENCOUNTER — Telehealth: Payer: Self-pay

## 2022-06-17 NOTE — Telephone Encounter (Signed)
Patient notified to try Alpha Lipoic acid 600 mg-1200 mg daily per Dr Cherylann Ratel.

## 2022-06-19 ENCOUNTER — Encounter: Payer: Self-pay | Admitting: Endocrinology

## 2022-06-22 ENCOUNTER — Encounter: Payer: Self-pay | Admitting: Family Medicine

## 2022-06-23 ENCOUNTER — Telehealth: Payer: Self-pay

## 2022-06-23 NOTE — Telephone Encounter (Signed)
Transition Care Management Follow-up Telephone Call Date of discharge and from where: 5/28 [place]  How have you been since you were released from the hospital? Patient expressed her concerns about how she was treated at [facility] ED and requested Patient experience number and declined the rest of the call  Any questions or concerns? Yes  Items Reviewed: Did the pt receive and understand the discharge instructions provided?  Medications obtained and verified?  Other?  Any new allergies since your discharge? Dietary orders reviewed?  Do you have support at home?   Follow up appointments reviewed:  PCP Hospital f/u appt confirmed?  Scheduled to see  on  @ . Specialist Hospital f/u appt confirmed?  Scheduled to see  on  @ . Are transportation arrangements needed?  If their condition worsens, is the pt aware to call PCP or go to the Emergency Dept.?  Was the patient provided with contact information for the PCP's office or ED?  Was to pt encouraged to call back with questions or concerns?

## 2022-06-24 ENCOUNTER — Other Ambulatory Visit: Payer: Self-pay | Admitting: Endocrinology

## 2022-06-24 DIAGNOSIS — E1165 Type 2 diabetes mellitus with hyperglycemia: Secondary | ICD-10-CM

## 2022-06-24 MED ORDER — INSULIN LISPRO (1 UNIT DIAL) 100 UNIT/ML (KWIKPEN): PEN_INJECTOR | SUBCUTANEOUS | 2 refills | Status: DC

## 2022-06-28 ENCOUNTER — Encounter: Payer: Self-pay | Admitting: Family Medicine

## 2022-06-29 ENCOUNTER — Ambulatory Visit (INDEPENDENT_AMBULATORY_CARE_PROVIDER_SITE_OTHER): Payer: 59 | Admitting: Family Medicine

## 2022-06-29 ENCOUNTER — Encounter: Payer: Self-pay | Admitting: Family Medicine

## 2022-06-29 VITALS — BP 128/76 | HR 94 | Temp 98.0°F | Resp 16 | Ht 66.5 in | Wt 240.7 lb

## 2022-06-29 DIAGNOSIS — Z7985 Long-term (current) use of injectable non-insulin antidiabetic drugs: Secondary | ICD-10-CM | POA: Diagnosis not present

## 2022-06-29 DIAGNOSIS — Z09 Encounter for follow-up examination after completed treatment for conditions other than malignant neoplasm: Secondary | ICD-10-CM | POA: Diagnosis not present

## 2022-06-29 DIAGNOSIS — Z794 Long term (current) use of insulin: Secondary | ICD-10-CM

## 2022-06-29 DIAGNOSIS — N179 Acute kidney failure, unspecified: Secondary | ICD-10-CM | POA: Diagnosis not present

## 2022-06-29 DIAGNOSIS — E1165 Type 2 diabetes mellitus with hyperglycemia: Secondary | ICD-10-CM

## 2022-06-29 MED ORDER — TRULICITY 4.5 MG/0.5ML ~~LOC~~ SOAJ: 4.5000 mg | SUBCUTANEOUS | 0 refills | Status: DC

## 2022-06-29 NOTE — Patient Instructions (Addendum)
eGFR (estimated Glomerular filtering rate) Most adults normal is over 60 Chronic kidney disease (CKD) stage 3 is 30-59 CKD stage 4 is 15-29 CKD stage 5 (end stage - failure) is <15   Chronic Kidney Disease, Adult Chronic kidney disease (CKD) occurs when the kidneys are slowly and permanently damaged over a long period of time. The kidneys are a pair of organs that do many important jobs in the body, including: Removing waste and extra fluid from the blood to make urine. Making hormones that maintain the amount of fluid in tissues and blood vessels. Maintaining the right amount of fluids and chemicals in the body. A small amount of kidney damage may not cause problems, but a large amount of damage may make it hard or impossible for the kidneys to work right. Steps must be taken to slow kidney damage or to stop it from getting worse. If steps are not taken, the kidneys may stop working permanently (end-stage renal disease, or ESRD). Most of the time, CKD does not go away, but it can often be controlled. People who have CKD are usually able to live full lives. What are the causes? The most common causes of this condition are diabetes and high blood pressure (hypertension). Other causes include: Cardiovascular diseases. These affect the heart and blood vessels. Kidney diseases. These include: Glomerulonephritis, or inflammation of the tiny filters in the kidneys. Interstitial nephritis. This is swelling of the small tubes of the kidneys and of the surrounding structures. Polycystic kidney disease, in which clusters of fluid-filled sacs form within the kidneys. Renal vascular disease. This includes disorders that affect the arteries and veins of the kidneys. Diseases that affect the body's defense system (immune system). A problem with urine flow. This may be caused by: Kidney stones. Cancer. An enlarged prostate, in males. A kidney infection or urinary tract infection (UTI) that keeps coming  back. Vasculitis. This is swelling or inflammation of the blood vessels. What increases the risk? Your chances of having kidney disease increase with age. The following factors may make you more likely to develop this condition: A family history of kidney disease or kidney failure. Kidney failure means the kidneys can no longer work right. Certain genetic diseases. Taking medicines often that are damaging to the kidneys. Being around or being in contact with toxic substances. Obesity. A history of tobacco use. What are the signs or symptoms? Symptoms of this condition include: Feeling very tired (lethargic) and having less energy. Swelling, or edema, of the face, legs, ankles, or feet. Nausea or vomiting, or loss of appetite. Confusion or trouble concentrating. Muscle twitches and cramps, especially in the legs. Dry, itchy skin. A metallic taste in the mouth. Producing less urine, or producing more urine (especially at night). Shortness of breath. Trouble sleeping. CKD may also result in not having enough red blood cells or hemoglobin in the blood (anemia) or having weak bones (bone disease). Symptoms develop slowly and may not be obvious until the kidney damage becomes severe. It is possible to have kidney disease for years without having symptoms. How is this diagnosed? This condition may be diagnosed based on: Blood tests. Urine tests. Imaging tests, such as an ultrasound or a CT scan. A kidney biopsy. This involves removing a sample of kidney tissue to be looked at under a microscope. Results from these tests will help to determine how serious the CKD is. How is this treated? There is no cure for most cases of this condition, but treatment usually relieves symptoms  and prevents or slows the worsening of the disease. Treatment may include: Diet changes, which may require you to avoid alcohol and foods that are high in salt, potassium, phosphorous, and protein. Medicines. These  may: Lower blood pressure. Control blood sugar (glucose). Relieve anemia. Relieve swelling. Protect your bones. Improve the balance of salts and minerals in your blood (electrolytes). Dialysis, which is a type of treatment that removes toxic waste from the body. It may be needed if you have kidney failure. Managing any other conditions that are causing your CKD or making it worse. Follow these instructions at home: Medicines Take over-the-counter and prescription medicines only as told by your health care provider. The amount of some medicines that you take may need to be changed. Do not take any new medicines unless approved by your health care provider. Many medicines can make kidney damage worse. Do not take any vitamin and mineral supplements unless approved by your health care provider. Many nutritional supplements can make kidney damage worse. Lifestyle  Do not use any products that contain nicotine or tobacco, such as cigarettes, e-cigarettes, and chewing tobacco. If you need help quitting, ask your health care provider. If you drink alcohol: Limit how much you use to: 0-1 drink a day for women who are not pregnant. 0-2 drinks a day for men. Know how much alcohol is in your drink. In the [place] one drink equals one 12 oz bottle of beer (355 mL), one 5 oz glass of wine (148 mL), or one 1 oz glass of hard liquor (44 mL). Maintain a healthy weight. If you need help, ask your health care provider. General instructions  Follow instructions from your health care provider about eating or drinking restrictions, including any prescribed diet. Track your blood pressure at home. Report changes in your blood pressure as told. If you are being treated for diabetes, track your blood glucose levels as told. Start or continue an exercise plan. Exercise at least 30 minutes a day, 5 days a week. Keep your immunizations up to date as told. Keep all follow-up visits. This is important. Where to  find more information American Association of Kidney Patients: [employer]: www.kidney.org American [employer]: FightingMatch.com.ee Life Options: www.lifeoptions.org Kidney School: www.kidneyschool.org Contact a health care provider if: Your symptoms get worse. You develop new symptoms. Get help right away if: You develop symptoms of ESRD. These include: Headaches. Numbness in your hands or feet. Easy bruising. Frequent hiccups. Chest pain. Shortness of breath. Lack of menstrual periods, in women. You have a fever. You are producing less urine than usual. You have pain or bleeding when you urinate or when you have a bowel movement. These symptoms may represent a serious problem that is an emergency. Do not wait to see if the symptoms will go away. Get medical help right away. Call your local emergency services (911 in the [place] Do not drive yourself to the hospital. Summary Chronic kidney disease (CKD) occurs when the kidneys become damaged slowly over a long period of time. The most common causes of this condition are diabetes and high blood pressure (hypertension). There is no cure for most cases of CKD, but treatment usually relieves symptoms and prevents or slows the worsening of the disease. Treatment may include a combination of lifestyle changes, medicines, and dialysis. This information is not intended to replace advice given to you by your health care provider. Make sure you discuss any questions you have with your health care provider. Document Revised: 04/12/2019  Document Reviewed: 04/12/2019 Elsevier Patient Education  2024 [employer].

## 2022-06-29 NOTE — Progress Notes (Signed)
Patient ID: [name], female    DOB: 1965/09/30, 57 y.o.   MRN: [id number]  PCP: [name], [name]  Chief Complaint  Patient presents with   Kidney Function    Would like to discuss kidney function    Subjective:   [name] is a 57 y.o. female, presents to clinic with CC of the following:  HPI  Pt with ER f/up concern that we recheck renal function today, she is worried about her renal function, she notes on 5/25 she was walking at [facility] and got lighheaded, she almost passed out, she was taken to the ED for eval. There she states she had "acute kidney failure" and a systolic BP of "30 or something" but she doesn't understand why they didn't admitted her.  I reviewed her ED visit and prior hospitalization last year sept -  ER visit 06/16/2022 eGFR 56 , BUN/sCR 26/1.14, glucose 323 ED-admit 10/15/2021 eGFR 18, BUN/sCR 36/2.96, glucose 407, +AG Reviewed labs today and showed pt the difference  Lab Results  Component Value Date   EGFR 70 01/23/2022   EGFR 64 11/17/2021   EGFR 49 (L) 06/02/2021   EGFR 50 (L) 05/13/2021   Renal function recent labs:  Lab Results  Component Value Date   CREATININE 1.14 (H) 06/16/2022   BUN 26 (H) 06/16/2022   NA 136 06/16/2022   K 4.4 06/16/2022   CL 103 06/16/2022   CO2 26 06/16/2022   Uncontrolled DM - IDDM, managed by endocrinology  Feeling excessively thirsty, urinating a lot, she's had some lightheaded episode This am sugars 199, 215 after breakfast  Out trulicity for over a month due to supply        Patient Active Problem List   Diagnosis Date Noted   Uncontrolled type 2 diabetes mellitus with hyperglycemia (HCC) 11/17/2021   Insulin dependent type 2 diabetes mellitus (HCC) 11/17/2021   Acute kidney injury (HCC) 10/15/2021   Mild episode of recurrent major depressive disorder (HCC) 08/14/2021   Hypoglycemia 07/07/2021   UTI (urinary tract infection) 07/07/2021   Syncope 07/07/2021    Diarrhea 06/09/2021   Hyperkalemia 06/09/2021   Intractable nausea and vomiting 06/08/2021   Orthostatic hypotension 04/29/2021   Constipation 04/28/2021   Hypertensive urgency 04/23/2021   GERD without esophagitis 04/23/2021   AKI (acute kidney injury) (HCC) 04/23/2021   Dyslipidemia 04/23/2021   Chronic diastolic CHF (congestive heart failure) (HCC) 04/23/2021   Cervical spondylosis 02/12/2021   Lumbar facet arthropathy 02/12/2021   Major depressive disorder, recurrent episode, moderate (HCC) 11/13/2020   Adrenal adenoma, right    PAD (peripheral artery disease) (HCC) 01/24/2020   Abnormal ankle brachial index (ABI) 12/18/2019   CAD (coronary artery disease) 04/27/2019   Diabetic polyneuropathy associated with type 2 diabetes mellitus (HCC) 03/02/2018   Vitamin D deficiency 01/24/2018   Overactive bladder 05/18/2017   Dilated aortic root (HCC)    Sacroiliac dysfunction 10/09/2014   Chronic low back pain 09/03/2014   Hemiparesis affecting left side as late effect of cerebrovascular accident (CVA) (HCC) 09/03/2014   OSA (obstructive sleep apnea) 05/15/2014   Dyslipidemia associated with type 2 diabetes mellitus (HCC) 01/21/2014   Essential hypertension, benign 01/21/2014   Chronic pain syndrome 01/21/2014   Headache disorder 09/07/2013   Mixed urge and stress incontinence 08/25/2011   Ureteral stricture, right 08/25/2011   Nephrolithiasis 08/22/2011   Incisional hernia 05/10/2011   BLINDNESS, LEGAL, [place] DEFINITION 09/14/2006   Seizure (HCC) 09/13/2006  Current Outpatient Medications:    amitriptyline (ELAVIL) 25 MG tablet, Take 2-3 tablets (50-75 mg total) by mouth at bedtime., Disp: 90 tablet, Rfl: 5   baclofen (LIORESAL) 10 MG tablet, TAKE 1 TABLET BY MOUTH THREE TIMES A DAY AS NEEDED FOR MUSCLE SPASMS, Disp: 270 tablet, Rfl: 1   BD PEN NEEDLE NANO 2ND GEN 32G X 4 MM MISC, USE WITH HUMALOG TO FILL PUMP DAILY, Disp: 100 each, Rfl: 2   bisacodyl (DULCOLAX) 5 MG EC  tablet, Take 1 tablet (5 mg total) by mouth daily as needed for moderate constipation., Disp: 30 tablet, Rfl: 0   blood glucose meter kit and supplies KIT, Dispense based on patient and insurance preference. Use up to four times daily as directed. (FOR E11.65 Z79.4)., Disp: 1 each, Rfl: 0   butalbital-acetaminophen-caffeine (FIORICET) 50-325-40 MG tablet, Take 1-2 tablets by mouth every 6 (six) hours as needed for headache., Disp: 10 tablet, Rfl: 0   carvedilol (COREG) 25 MG tablet, Take 25 mg by mouth 2 (two) times daily., Disp: , Rfl:    cholecalciferol (VITAMIN D3) 25 MCG (1000 UNIT) tablet, Take 1,000 Units by mouth daily., Disp: , Rfl:    ciclopirox (PENLAC) 8 % solution, Apply 1 Application topically at bedtime., Disp: , Rfl:    cloNIDine (CATAPRES) 0.1 MG tablet, Take 0.1 mg by mouth daily as needed (For blood pressure overm160/90)., Disp: , Rfl:    clopidogrel (PLAVIX) 75 MG tablet, Take 1 tablet (75 mg total) by mouth daily., Disp: 90 tablet, Rfl: 0   Continuous Blood Gluc Receiver (DEXCOM G7 RECEIVER) DEVI, To display CGM data, Disp: 1 each, Rfl: 0   Continuous Blood Gluc Sensor (DEXCOM G6 SENSOR) MISC, Use to monitor blood sugar, change after 10 days, Disp: 3 each, Rfl: 3   cycloSPORINE (RESTASIS) 0.05 % ophthalmic emulsion, Place 2 drops into both eyes 2 (two) times daily., Disp: , Rfl:    Dulaglutide (TRULICITY) 4.5 MG/0.5ML SOPN, Inject 4.5 mg as directed once a week., Disp: 6 mL, Rfl: 1   DULoxetine (CYMBALTA) 60 MG capsule, Take 1 capsule (60 mg total) by mouth daily., Disp: 90 capsule, Rfl: 3   EPINEPHrine 0.3 mg/0.3 mL IJ SOAJ injection, Inject 0.3 mg into the muscle as needed for anaphylaxis., Disp: , Rfl:    ezetimibe (ZETIA) 10 MG tablet, Take 1 tablet (10 mg total) by mouth daily., Disp: 90 tablet, Rfl: 3   furosemide (LASIX) 20 MG tablet, Take 20 mg by mouth., Disp: , Rfl:    gabapentin (NEURONTIN) 600 MG tablet, 600 mg qAM, 600 mg qPM, 1200 mg QHS, Disp: 120 tablet, Rfl: 5    Glucagon (GVOKE HYPOPEN 2-PACK) 1 MG/0.2ML SOAJ, INJECT 1 MG INTO THE SKIN ONCE AS NEEDED FOR UP TO 1 DOSE (HYPOGLYCEMIA). ONLY FOR SEVERE LOW BLOOD SUGAR NOT RESPONDING TO DRINKING JUICE/GLUCOSE TABLETS, WHEN CBG IS <70, Disp: 0.4 mL, Rfl: 2   Glucose Blood (BLOOD GLUCOSE TEST STRIPS) STRP, Use as directed to monitor blood sugar up to 4x a day FOR E11.65 Z79.4, Disp: 300 strip, Rfl: 5   insulin glargine, 2 Unit Dial, (TOUJEO MAX SOLOSTAR) 300 UNIT/ML Solostar Pen, 70 units in am, Disp: 9 mL, Rfl: 3   insulin lispro (HUMALOG KWIKPEN) 100 UNIT/ML KwikPen, Inject up to 30 units before each meal, Disp: 15 mL, Rfl: 2   Lancets MISC, Use as directed to monitor blood sugar up to 4x a day FOR E11.65 Z79.4, Disp: 200 each, Rfl: 11   levETIRAcetam (KEPPRA) 500 MG  tablet, Take 500-750 mg by mouth See admin instructions. Take 1 tablet (500mg ) by mouth every morning and take 1 tablets by mouth (750mg ) by mouth every night, Disp: , Rfl:    meclizine (ANTIVERT) 25 MG tablet, Take 1 tablet (25 mg total) by mouth 3 (three) times daily as needed for dizziness., Disp: 30 tablet, Rfl: 0   mirabegron ER (MYRBETRIQ) 50 MG TB24 tablet, Take 1 tablet (50 mg total) by mouth daily., Disp: 90 tablet, Rfl: 1   olmesartan (BENICAR) 40 MG tablet, Take 1 tablet (40 mg total) by mouth daily., Disp: 30 tablet, Rfl: 11   pantoprazole (PROTONIX) 40 MG tablet, Take 1 tablet (40 mg total) by mouth daily., Disp: 30 tablet, Rfl: 3   polyethylene glycol (MIRALAX / GLYCOLAX) 17 g packet, Take 17 g by mouth daily as needed for mild constipation., Disp: 100 each, Rfl: 3   rosuvastatin (CRESTOR) 10 MG tablet, Take 1 tablet (10 mg total) by mouth daily., Disp: 90 tablet, Rfl: 3   senna-docusate (SENOKOT-S) 8.6-50 MG tablet, Take 1 tablet by mouth at bedtime as needed for mild constipation., Disp: 60 tablet, Rfl: 0   sodium phosphate (FLEET) 7-19 GM/118ML ENEM, Place 133 mLs (1 enema total) rectally daily as needed for severe constipation., Disp:  133 mL, Rfl: 2   WIXELA INHUB 250-50 MCG/DOSE AEPB, Inhale 1 puff into the lungs 2 (two) times daily., Disp: , Rfl:    Continuous Blood Gluc Sensor (DEXCOM G7 SENSOR) MISC, 1 Device by Does not apply route as directed. Change sensor every 10 days, Disp: 3 each, Rfl: 3   Insulin Disposable Pump (OMNIPOD 5 G6 PODS, GEN 5,) MISC, 1 Device by Does not apply route every 3 (three) days. (Patient not taking: Reported on 04/16/2022), Disp: 2 each, Rfl: 3   Allergies  Allergen Reactions   Codeine Swelling and Other (See Comments)    Swelling and burning of mouth (inside)   [place] [Dapagliflozin] Other (See Comments)    Recurrent UTI with farxiga   Lactose Intolerance (Gi) Nausea And Vomiting     Social History   Tobacco Use   Smoking status: Former    Packs/day: 1.00    Years: 15.00    Additional pack years: 0.00    Total pack years: 15.00    Types: Cigarettes    Quit date: 01/20/1987    Years since quitting: 35.4   Smokeless tobacco: Never   Tobacco comments:    quit more than 20 years  Vaping Use   Vaping Use: Never used  Substance Use Topics   Alcohol use: Yes    Alcohol/week: 1.0 standard drink of alcohol    Types: 1 Glasses of wine per week   Drug use: No    Comment: hx of marijuana use no longer uses       Chart Review Today: I personally reviewed active problem list, medication list, allergies, family history, social history, health maintenance, notes from last encounter, lab results, imaging with the patient/caregiver today.   Review of Systems  Constitutional: Negative.   HENT: Negative.    Eyes: Negative.   Respiratory: Negative.    Cardiovascular: Negative.   Gastrointestinal: Negative.   Endocrine: Negative.   Genitourinary: Negative.   Musculoskeletal: Negative.   Skin: Negative.   Allergic/Immunologic: Negative.   Neurological: Negative.   Hematological: Negative.   Psychiatric/Behavioral: Negative.    All other systems reviewed and are negative.       Objective:   Vitals:   06/29/22 1402  BP: 128/76  Pulse: 94  Resp: 16  Temp: 98 F (36.7 C)  SpO2: 92%  Weight: 240 lb 11.2 oz (109.2 kg)  Height: 5' 6.5" (1.689 m)    Body mass index is 38.27 kg/m.  Physical Exam Vitals and nursing note reviewed.  Constitutional:      General: She is not in acute distress.    Appearance: Normal appearance. She is well-developed. She is not ill-appearing, toxic-appearing or diaphoretic.     Interventions: Face mask in place.  HENT:     Head: Normocephalic and atraumatic.     Right Ear: External ear normal.     Left Ear: External ear normal.  Eyes:     General: Lids are normal. No scleral icterus.       Right eye: No discharge.        Left eye: No discharge.     Conjunctiva/sclera: Conjunctivae normal.  Neck:     Trachea: Phonation normal. No tracheal deviation.  Cardiovascular:     Rate and Rhythm: Normal rate and regular rhythm.     Pulses: Normal pulses.          Radial pulses are 2+ on the right side and 2+ on the left side.       Posterior tibial pulses are 2+ on the right side and 2+ on the left side.     Heart sounds: Normal heart sounds. No murmur heard.    No friction rub. No gallop.  Pulmonary:     Effort: Pulmonary effort is normal. No respiratory distress.     Breath sounds: Normal breath sounds. No stridor. No wheezing, rhonchi or rales.  Chest:     Chest wall: No tenderness.  Abdominal:     General: Bowel sounds are normal. There is no distension.     Palpations: Abdomen is soft.  Musculoskeletal:     Right lower leg: No edema.     Left lower leg: No edema.  Skin:    General: Skin is warm and dry.     Coloration: Skin is not jaundiced or pale.     Findings: No rash.  Neurological:     Mental Status: She is alert.     Motor: No abnormal muscle tone.     Gait: Gait normal.     Comments: Left hemiplegia improved since last OV, using cane  Psychiatric:        Mood and Affect: Mood normal.        Speech: Speech  normal.        Behavior: Behavior normal.      Results for orders placed or performed during the hospital encounter of 06/16/22  Protime-INR  Result Value Ref Range   Prothrombin Time 13.1 11.4 - 15.2 seconds   INR 1.0 0.8 - 1.2  APTT  Result Value Ref Range   aPTT 26 24 - 36 seconds  CBC  Result Value Ref Range   WBC 5.4 4.0 - 10.5 K/uL   RBC 4.25 3.87 - 5.11 MIL/uL   Hemoglobin 12.3 12.0 - 15.0 g/dL   HCT 16.1 09.6 - 04.5 %   MCV 93.4 80.0 - 100.0 fL   MCH 28.9 26.0 - 34.0 pg   MCHC 31.0 30.0 - 36.0 g/dL   RDW 40.9 81.1 - 91.4 %   Platelets 248 150 - 400 K/uL   nRBC 0.0 0.0 - 0.2 %  Differential  Result Value Ref Range   Neutrophils Relative % 63 %   Neutro Abs  3.4 1.7 - 7.7 K/uL   Lymphocytes Relative 29 %   Lymphs Abs 1.6 0.7 - 4.0 K/uL   Monocytes Relative 7 %   Monocytes Absolute 0.4 0.1 - 1.0 K/uL   Eosinophils Relative 1 %   Eosinophils Absolute 0.0 0.0 - 0.5 K/uL   Basophils Relative 0 %   Basophils Absolute 0.0 0.0 - 0.1 K/uL   Immature Granulocytes 0 %   Abs Immature Granulocytes 0.01 0.00 - 0.07 K/uL  Comprehensive metabolic panel  Result Value Ref Range   Sodium 136 135 - 145 mmol/L   Potassium 4.4 3.5 - 5.1 mmol/L   Chloride 103 98 - 111 mmol/L   CO2 26 22 - 32 mmol/L   Glucose, Bld 323 (H) 70 - 99 mg/dL   BUN 26 (H) 6 - 20 mg/dL   Creatinine, Ser 1.91 (H) 0.44 - 1.00 mg/dL   Calcium 9.1 8.9 - 47.8 mg/dL   Total Protein 6.9 6.5 - 8.1 g/dL   Albumin 3.4 (L) 3.5 - 5.0 g/dL   AST 14 (L) 15 - 41 U/L   ALT 18 0 - 44 U/L   Alkaline Phosphatase 82 38 - 126 U/L   Total Bilirubin 0.3 0.3 - 1.2 mg/dL   GFR, Estimated 56 (L) >60 mL/min   Anion gap 7 5 - 15  Ethanol  Result Value Ref Range   Alcohol, Ethyl (B) <10 <10 mg/dL  Beta-hydroxybutyric acid  Result Value Ref Range   Beta-Hydroxybutyric Acid 0.27 0.05 - 0.27 mmol/L  Blood gas, venous  Result Value Ref Range   pH, Ven 7.36 7.25 - 7.43   pCO2, Ven 47 44 - 60 mmHg   pO2, Ven 41 32 - 45 mmHg    Bicarbonate 26.6 20.0 - 28.0 mmol/L   Acid-Base Excess 0.6 0.0 - 2.0 mmol/L   O2 Saturation 66.6 %   Patient temperature 37.0    Collection site VEIN   CBG monitoring, ED  Result Value Ref Range   Glucose-Capillary 273 (H) 70 - 99 mg/dL  Troponin I (High Sensitivity)  Result Value Ref Range   Troponin I (High Sensitivity) 8 <18 ng/L  Troponin I (High Sensitivity)  Result Value Ref Range   Troponin I (High Sensitivity) 6 <18 ng/L   *Note: Due to a large number of results and/or encounters for the requested time period, some results have not been displayed. A complete set of results can be found in Results Review.       Assessment & Plan:   1. AKI (acute kidney injury) (HCC) Pt concerned about renal function with recent ED visit  Latest Reference Range & Units 11/17/21 11:50 01/23/22 11:57 04/14/22 08:39 06/16/22 08:46  Creatinine 0.44 - 1.00 mg/dL 2.95 6.21 3.08 6.57 (H)   Pt baseline sCr around .90-1.00, slightly increase with ED labs eGFR baseline has been 64-74, down to 56   Of note pt has increased urine volume and frequency which is more consistent with autodiuresing and not AKI ED noted "mild AKI' and pt clinically improved with IV bolus in ED and recheck of VS and ambulation will recheck labs - COMPLETE METABOLIC PANEL WITH GFR  2. Uncontrolled type 2 diabetes mellitus with hyperglycemia (HCC) She needs to f/up with endo Lab Results  Component Value Date   HGBA1C 9.8 (A) 04/16/2022  On basal insulin - she will not increase dose because she feels symptomatic/hypoglycemic if fasting CBG are <150 She has polyuria, polydipsia Out of trulicity so I sent it to a  different pharmacy  - Dulaglutide (TRULICITY) 4.5 MG/0.5ML SOPN; Inject 4.5 mg as directed once a week.  Dispense: 6 mL; Refill: 0 - COMPLETE METABOLIC PANEL WITH GFR - Microalbumin / creatinine urine ratio  3. Encounter for examination following treatment at hospital Reviewed ED encounter and labs throroughly  with the pt today - COMPLETE METABOLIC PANEL WITH GFR ED MDM: 57 year old female presenting to the emergency department for evaluation of dizziness with associated dysarthria.  Hyperglycemic on presentation, but blood work not reflective of DKA. She does have an mild acute kidney injury with a creatinine of 1.14 up from 0.86.  She does have a history of CHF, but clinically appears dry.  Does also have borderline hypotension here, will trial fluid resuscitation and watch blood pressure.   On reevaluation, blood pressure is improved.  Patient was able to ambulate with steady gait, but did report ongoing dizziness both at rest and with movement.  Case was reviewed with Dr. Iver Nestle with neurology.  She reviewed the patient's recent MRI.  She did not feel that patient needed repeat imaging at this time and did feel that she could be discharged with strict return precautions and outpatient follow-up.  Discussed with patient who is comfortable with this plan.  Strict return precautions provided.  Patient discharged in stable condition Pt BP is better today, no recurrent dizzy episodes, CBGs still high     [name], [name] 06/29/22 2:17 PM

## 2022-06-30 ENCOUNTER — Other Ambulatory Visit: Payer: Self-pay

## 2022-06-30 ENCOUNTER — Encounter: Payer: Self-pay | Admitting: Student in an Organized Health Care Education/Training Program

## 2022-06-30 DIAGNOSIS — E1165 Type 2 diabetes mellitus with hyperglycemia: Secondary | ICD-10-CM

## 2022-06-30 MED ORDER — TOUJEO MAX SOLOSTAR 300 UNIT/ML ~~LOC~~ SOPN
PEN_INJECTOR | SUBCUTANEOUS | 3 refills | Status: DC
Start: 2022-06-30 — End: 2022-07-21

## 2022-06-30 MED ORDER — INSULIN LISPRO (1 UNIT DIAL) 100 UNIT/ML (KWIKPEN)
PEN_INJECTOR | SUBCUTANEOUS | 2 refills | Status: DC
Start: 2022-06-30 — End: 2022-07-21

## 2022-07-01 ENCOUNTER — Other Ambulatory Visit: Payer: Self-pay | Admitting: *Deleted

## 2022-07-01 DIAGNOSIS — G894 Chronic pain syndrome: Secondary | ICD-10-CM

## 2022-07-01 DIAGNOSIS — G890 Central pain syndrome: Secondary | ICD-10-CM

## 2022-07-01 DIAGNOSIS — E1142 Type 2 diabetes mellitus with diabetic polyneuropathy: Secondary | ICD-10-CM

## 2022-07-01 MED ORDER — BUTALBITAL-APAP-CAFFEINE 50-325-40 MG PO TABS: 1.0000 | ORAL_TABLET | Freq: Four times a day (QID) | ORAL | 0 refills | Status: DC | PRN

## 2022-07-01 MED ORDER — GABAPENTIN 600 MG PO TABS
ORAL_TABLET | ORAL | 5 refills | Status: DC
Start: 2022-07-01 — End: 2022-09-08

## 2022-07-01 MED ORDER — AMITRIPTYLINE HCL 25 MG PO TABS
50.0000 mg | ORAL_TABLET | Freq: Every day | ORAL | 5 refills | Status: DC
Start: 2022-07-01 — End: 2023-02-18

## 2022-07-01 NOTE — Telephone Encounter (Signed)
Rx request sent to Dr. Lateef 

## 2022-07-05 ENCOUNTER — Encounter: Payer: Self-pay | Admitting: Family Medicine

## 2022-07-07 DIAGNOSIS — B351 Tinea unguium: Secondary | ICD-10-CM | POA: Diagnosis not present

## 2022-07-07 DIAGNOSIS — Z794 Long term (current) use of insulin: Secondary | ICD-10-CM | POA: Diagnosis not present

## 2022-07-07 DIAGNOSIS — E1165 Type 2 diabetes mellitus with hyperglycemia: Secondary | ICD-10-CM | POA: Diagnosis not present

## 2022-07-07 DIAGNOSIS — M79675 Pain in left toe(s): Secondary | ICD-10-CM | POA: Diagnosis not present

## 2022-07-07 DIAGNOSIS — M79674 Pain in right toe(s): Secondary | ICD-10-CM | POA: Diagnosis not present

## 2022-07-13 ENCOUNTER — Other Ambulatory Visit: Payer: 59

## 2022-07-17 ENCOUNTER — Ambulatory Visit: Payer: 59 | Admitting: Endocrinology

## 2022-07-18 NOTE — Progress Notes (Signed)
Alcohol and drug screening for evaluation of possible etiologies of undifferentiated altered mental status

## 2022-07-19 ENCOUNTER — Other Ambulatory Visit: Payer: Self-pay

## 2022-07-19 ENCOUNTER — Observation Stay
Admission: EM | Admit: 2022-07-19 | Discharge: 2022-07-22 | Disposition: A | Discharge status: Home / Self Care | Payer: 59 | Attending: Internal Medicine | Admitting: Internal Medicine

## 2022-07-19 ENCOUNTER — Emergency Department
Admission: EM | Admit: 2022-07-19 | Discharge: 2022-07-19 | Disposition: A | Discharge status: Home / Self Care | Payer: 59 | Source: Home / Self Care | Attending: Emergency Medicine | Admitting: Emergency Medicine

## 2022-07-19 ENCOUNTER — Emergency Department: Payer: 59

## 2022-07-19 DIAGNOSIS — Z87891 Personal history of nicotine dependence: Secondary | ICD-10-CM | POA: Diagnosis not present

## 2022-07-19 DIAGNOSIS — I5032 Chronic diastolic (congestive) heart failure: Secondary | ICD-10-CM | POA: Diagnosis not present

## 2022-07-19 DIAGNOSIS — K219 Gastro-esophageal reflux disease without esophagitis: Secondary | ICD-10-CM | POA: Diagnosis present

## 2022-07-19 DIAGNOSIS — N390 Urinary tract infection, site not specified: Secondary | ICD-10-CM

## 2022-07-19 DIAGNOSIS — E1122 Type 2 diabetes mellitus with diabetic chronic kidney disease: Secondary | ICD-10-CM | POA: Insufficient documentation

## 2022-07-19 DIAGNOSIS — I130 Hypertensive heart and chronic kidney disease with heart failure and stage 1 through stage 4 chronic kidney disease, or unspecified chronic kidney disease: Secondary | ICD-10-CM | POA: Diagnosis not present

## 2022-07-19 DIAGNOSIS — I110 Hypertensive heart disease with heart failure: Secondary | ICD-10-CM | POA: Insufficient documentation

## 2022-07-19 DIAGNOSIS — J449 Chronic obstructive pulmonary disease, unspecified: Secondary | ICD-10-CM | POA: Insufficient documentation

## 2022-07-19 DIAGNOSIS — N3000 Acute cystitis without hematuria: Secondary | ICD-10-CM | POA: Diagnosis not present

## 2022-07-19 DIAGNOSIS — R569 Unspecified convulsions: Secondary | ICD-10-CM

## 2022-07-19 DIAGNOSIS — I509 Heart failure, unspecified: Secondary | ICD-10-CM | POA: Insufficient documentation

## 2022-07-19 DIAGNOSIS — Z794 Long term (current) use of insulin: Secondary | ICD-10-CM | POA: Diagnosis not present

## 2022-07-19 DIAGNOSIS — G40A09 Absence epileptic syndrome, not intractable, without status epilepticus: Secondary | ICD-10-CM

## 2022-07-19 DIAGNOSIS — R42 Dizziness and giddiness: Secondary | ICD-10-CM

## 2022-07-19 DIAGNOSIS — R404 Transient alteration of awareness: Secondary | ICD-10-CM | POA: Diagnosis not present

## 2022-07-19 DIAGNOSIS — E860 Dehydration: Secondary | ICD-10-CM | POA: Insufficient documentation

## 2022-07-19 DIAGNOSIS — I951 Orthostatic hypotension: Secondary | ICD-10-CM | POA: Diagnosis present

## 2022-07-19 DIAGNOSIS — I10 Essential (primary) hypertension: Secondary | ICD-10-CM | POA: Diagnosis present

## 2022-07-19 DIAGNOSIS — Z79899 Other long term (current) drug therapy: Secondary | ICD-10-CM | POA: Diagnosis not present

## 2022-07-19 DIAGNOSIS — R531 Weakness: Secondary | ICD-10-CM | POA: Diagnosis not present

## 2022-07-19 DIAGNOSIS — R739 Hyperglycemia, unspecified: Secondary | ICD-10-CM

## 2022-07-19 DIAGNOSIS — E1165 Type 2 diabetes mellitus with hyperglycemia: Secondary | ICD-10-CM | POA: Insufficient documentation

## 2022-07-19 DIAGNOSIS — R0902 Hypoxemia: Secondary | ICD-10-CM | POA: Diagnosis not present

## 2022-07-19 DIAGNOSIS — I69354 Hemiplegia and hemiparesis following cerebral infarction affecting left non-dominant side: Secondary | ICD-10-CM

## 2022-07-19 DIAGNOSIS — J45909 Unspecified asthma, uncomplicated: Secondary | ICD-10-CM | POA: Diagnosis not present

## 2022-07-19 DIAGNOSIS — Z743 Need for continuous supervision: Secondary | ICD-10-CM | POA: Diagnosis not present

## 2022-07-19 DIAGNOSIS — R6889 Other general symptoms and signs: Secondary | ICD-10-CM | POA: Diagnosis not present

## 2022-07-19 DIAGNOSIS — N189 Chronic kidney disease, unspecified: Secondary | ICD-10-CM | POA: Insufficient documentation

## 2022-07-19 DIAGNOSIS — E785 Hyperlipidemia, unspecified: Secondary | ICD-10-CM | POA: Diagnosis not present

## 2022-07-19 LAB — BLOOD GAS, VENOUS
Acid-Base Excess: 2 mmol/L (ref 0.0–2.0)
Bicarbonate: 27.3 mmol/L (ref 20.0–28.0)
O2 Saturation: 62.2 %
Patient temperature: 37
pCO2, Ven: 44 mmHg (ref 44–60)
pH, Ven: 7.4 (ref 7.25–7.43)
pO2, Ven: 33 mmHg (ref 32–45)

## 2022-07-19 LAB — CBC WITH DIFFERENTIAL/PLATELET
Abs Immature Granulocytes: 0.02 10*3/uL (ref 0.00–0.07)
Basophils Absolute: 0 10*3/uL (ref 0.0–0.1)
Basophils Relative: 0 %
Eosinophils Absolute: 0 10*3/uL (ref 0.0–0.5)
Eosinophils Relative: 0 %
HCT: 40.6 % (ref 36.0–46.0)
Hemoglobin: 12.4 g/dL (ref 12.0–15.0)
Immature Granulocytes: 0 %
Lymphocytes Relative: 27 %
Lymphs Abs: 1.9 10*3/uL (ref 0.7–4.0)
MCH: 28.6 pg (ref 26.0–34.0)
MCHC: 30.5 g/dL (ref 30.0–36.0)
MCV: 93.8 fL (ref 80.0–100.0)
Monocytes Absolute: 0.7 10*3/uL (ref 0.1–1.0)
Monocytes Relative: 10 %
Neutro Abs: 4.4 10*3/uL (ref 1.7–7.7)
Neutrophils Relative %: 63 %
Platelets: 236 10*3/uL (ref 150–400)
RBC: 4.33 MIL/uL (ref 3.87–5.11)
RDW: 13.4 % (ref 11.5–15.5)
WBC: 7.1 10*3/uL (ref 4.0–10.5)
nRBC: 0 % (ref 0.0–0.2)

## 2022-07-19 LAB — CBC
HCT: 40.5 % (ref 36.0–46.0)
Hemoglobin: 12.7 g/dL (ref 12.0–15.0)
MCH: 29.1 pg (ref 26.0–34.0)
MCHC: 31.4 g/dL (ref 30.0–36.0)
MCV: 92.7 fL (ref 80.0–100.0)
Platelets: 249 10*3/uL (ref 150–400)
RBC: 4.37 MIL/uL (ref 3.87–5.11)
RDW: 13.4 % (ref 11.5–15.5)
WBC: 6.5 10*3/uL (ref 4.0–10.5)
nRBC: 0 % (ref 0.0–0.2)

## 2022-07-19 LAB — COMPREHENSIVE METABOLIC PANEL
ALT: 17 U/L (ref 0–44)
AST: 20 U/L (ref 15–41)
Albumin: 3.5 g/dL (ref 3.5–5.0)
Alkaline Phosphatase: 82 U/L (ref 38–126)
Anion gap: 12 (ref 5–15)
BUN: 17 mg/dL (ref 6–20)
CO2: 20 mmol/L — ABNORMAL LOW (ref 22–32)
Calcium: 8.9 mg/dL (ref 8.9–10.3)
Chloride: 103 mmol/L (ref 98–111)
Creatinine, Ser: 1.25 mg/dL — ABNORMAL HIGH (ref 0.44–1.00)
GFR, Estimated: 50 mL/min — ABNORMAL LOW (ref >60)
Glucose, Bld: 307 mg/dL — ABNORMAL HIGH (ref 70–99)
Potassium: 4.3 mmol/L (ref 3.5–5.1)
Sodium: 135 mmol/L (ref 135–145)
Total Bilirubin: 0.6 mg/dL (ref 0.3–1.2)
Total Protein: 6.6 g/dL (ref 6.5–8.1)

## 2022-07-19 LAB — TROPONIN I (HIGH SENSITIVITY): Troponin I (High Sensitivity): 7 ng/L (ref <18)

## 2022-07-19 LAB — BASIC METABOLIC PANEL
Anion gap: 10 (ref 5–15)
BUN: 13 mg/dL (ref 6–20)
CO2: 23 mmol/L (ref 22–32)
Calcium: 8.7 mg/dL — ABNORMAL LOW (ref 8.9–10.3)
Chloride: 104 mmol/L (ref 98–111)
Creatinine, Ser: 1.12 mg/dL — ABNORMAL HIGH (ref 0.44–1.00)
GFR, Estimated: 57 mL/min — ABNORMAL LOW (ref >60)
Glucose, Bld: 328 mg/dL — ABNORMAL HIGH (ref 70–99)
Potassium: 4 mmol/L (ref 3.5–5.1)
Sodium: 137 mmol/L (ref 135–145)

## 2022-07-19 LAB — URINALYSIS, ROUTINE W REFLEX MICROSCOPIC
Bilirubin Urine: NEGATIVE
Glucose, UA: >=500 mg/dL — AB
Hgb urine dipstick: NEGATIVE
Ketones, ur: NEGATIVE mg/dL
Nitrite: NEGATIVE
Protein, ur: NEGATIVE mg/dL
Specific Gravity, Urine: 1.012 (ref 1.005–1.030)
pH: 5 (ref 5.0–8.0)

## 2022-07-19 LAB — BETA-HYDROXYBUTYRIC ACID: Beta-Hydroxybutyric Acid: 0.12 mmol/L (ref 0.05–0.27)

## 2022-07-19 LAB — CBG MONITORING, ED
Glucose-Capillary: 295 mg/dL — ABNORMAL HIGH (ref 70–99)
Glucose-Capillary: 284 mg/dL — ABNORMAL HIGH (ref 70–99)
Glucose-Capillary: 334 mg/dL — ABNORMAL HIGH (ref 70–99)

## 2022-07-19 MED ORDER — CIPROFLOXACIN HCL 500 MG PO TABS: 500.0000 mg | ORAL_TABLET | Freq: Two times a day (BID) | ORAL | 0 refills | Status: DC

## 2022-07-19 MED ORDER — CEFDINIR 300 MG PO CAPS: 300.0000 mg | ORAL_CAPSULE | Freq: Two times a day (BID) | ORAL | 0 refills | Status: DC

## 2022-07-19 MED ORDER — SODIUM CHLORIDE 0.9 % IV BOLUS
500.0000 mL | Freq: Once | INTRAVENOUS | Status: AC
  Administered 2022-07-19: 500 mL via INTRAVENOUS

## 2022-07-19 MED ORDER — INSULIN ASPART 100 UNIT/ML IJ SOLN: 10.0000 [IU] | Freq: Once | INTRAMUSCULAR | Status: DC

## 2022-07-19 MED ORDER — SODIUM CHLORIDE 0.9 % IV SOLN
1.0000 g | Freq: Once | INTRAVENOUS | Status: AC
  Administered 2022-07-19: 1 g via INTRAVENOUS
  Filled 2022-07-19: qty 10

## 2022-07-19 MED ORDER — INSULIN ASPART 100 UNIT/ML IJ SOLN
8.0000 [IU] | Freq: Once | INTRAMUSCULAR | Status: AC
  Administered 2022-07-19: 8 [IU] via INTRAVENOUS
  Filled 2022-07-19: qty 1

## 2022-07-19 NOTE — ED Provider Notes (Signed)
[facility] Provider Note    Event Date/Time   First MD Initiated Contact with Patient 07/19/22 1418     (approximate)  History   Chief Complaint: Hypotension and Hyperglycemia  HPI  [name] is a 57 y.o. female with a past medical history of diabetes, COPD, CHF, hypertension, hyperlipidemia, seizure disorder, presents to the emergency department for generalized weakness.  According to the patient her blood sugars have been running high and she has been feeling more weak so she called EMS.  EMS states her blood pressure upon arrival was low patient was given 500 cc of normal saline and her blood pressure improved from 79 systolic to 95 systolic.  Upon arrival to the emergency department currently at 106 systolic.  Patient states a history of CHF so she is often times fluid restricted also states a history of CKD, states she knew her blood sugar was up but she has not been drinking many fluids due to her CHF.  Denies any increased urination over baseline.  Denies any dysuria.  No recent vomiting or diarrhea.  No chest pain abdominal pain fever cough or congestion.  Physical Exam   Triage Vital Signs: ED Triage Vitals [07/19/22 1419]  Enc Vitals Group     BP 106/63     Pulse Rate 71     Resp 18     Temp      Temp src      SpO2 94 %     Weight 240 lb 4.8 oz (109 kg)     Height 5\' 6"  (1.676 m)     Head Circumference      Peak Flow      Pain Score      Pain Loc      Pain Edu?      Excl. in GC?     Most recent vital signs: Vitals:   07/19/22 1419  BP: 106/63  Pulse: 71  Resp: 18  SpO2: 94%    General: Awake, no distress.  CV:  Good peripheral perfusion.  Regular rate and rhythm  Resp:  Normal effort.  Equal breath sounds bilaterally.  Abd:  No distention.  Soft, nontender.  No rebound or guarding.   ED Results / Procedures / Treatments   EKG  EKG viewed and interpreted by myself shows a sinus rhythm at 75 bpm with a narrow QRS,  normal axis, normal intervals, no concerning ST changes.  MEDICATIONS ORDERED IN ED: Medications  sodium chloride 0.9 % bolus 500 mL (has no administration in time range)     IMPRESSION / MDM / ASSESSMENT AND PLAN / ED COURSE  I reviewed the triage vital signs and the nursing notes.  Patient's presentation is most consistent with acute presentation with potential threat to life or bodily function.  Patient presents to the emergency department with concerns for high blood sugar and initial low blood pressure and weakness.  Patient states she is feeling somewhat better after receiving 500 cc of normal saline by EMS.  Blood pressure upon arrival per EMS was 77 systolic currently 106 systolic in the emergency department.  We will check labs including a VBG chemistry and beta hydroxybutyric acid.  We will IV hydrate with 500 cc of additional fluid while awaiting lab results.  Will obtain a urine sample and continue to closely monitor.  Patient agreeable to plan of care.  Patient's labs have resulted showing a reassuring VBG with a pH of 7.4.  Patient's chemistry does  show hyperglycemia with a blood glucose of 328 with a reassuringly normal anion gap.  We will dose 8 units of insulin in addition to 500 cc of normal saline.  CBC reassuring.  Beta hydroxybutyric acid reassuring.  We will continue to closely monitor.  Lungs the patient's blood sugar comes down and she is feeling better patient could potentially be discharged home.  Urinalysis pending to rule out urinary tract infection.  Patient care signed out to oncoming provider.  FINAL CLINICAL IMPRESSION(S) / ED DIAGNOSES   Hyperglycemia Dehydration    Note:  This document was prepared using Dragon voice recognition software and may include unintentional dictation errors.   Minna Antis, MD 07/19/22 [phone]

## 2022-07-19 NOTE — ED Provider Notes (Signed)
[facility] Provider Note    Event Date/Time   First MD Initiated Contact with Patient 07/19/22 2128     (approximate)   History   Chief Complaint Dizziness  HPI  [name] is a 57 y.o. female with past medical history of hypertension, hyperlipidemia, diabetes, COPD, CHF, and seizures presents to the ED for dizziness.  Patient reports that she has been feeling dizzy with elevated blood sugars for the past couple of days.  She was seen in the ED earlier today for the symptoms, had workup that was remarkable for low blood pressure that responded to IV fluids, also found to have UTI.  She was given a dose of IV Rocephin and discharged home, but states as soon as she got home she had significant difficulty walking.  She states that whenever she stood up she felt like she was going to pass out and so decided to call EMS again.  She continues to endorse dysuria but denies any fevers, abdominal pain, flank pain, nausea, or vomiting.  She states she has been compliant with her medications for diabetes.     Physical Exam   Triage Vital Signs: ED Triage Vitals  Enc Vitals Group     BP      Pulse      Resp      Temp      Temp src      SpO2      Weight      Height      Head Circumference      Peak Flow      Pain Score      Pain Loc      Pain Edu?      Excl. in GC?     Most recent vital signs: Vitals:   07/19/22 2141 07/19/22 2148  BP: 118/63   Pulse: 69   Resp: 17   Temp: 97.6 F (36.4 C)   SpO2: 100% 99%    Constitutional: Alert and oriented. Eyes: Conjunctivae are normal. Head: Atraumatic. Nose: No congestion/rhinnorhea. Mouth/Throat: Mucous membranes are moist.  Cardiovascular: Normal rate, regular rhythm. Grossly normal heart sounds.  2+ radial pulses bilaterally. Respiratory: Normal respiratory effort.  No retractions. Lungs CTAB. Gastrointestinal: Soft and nontender. No distention. Musculoskeletal: No lower extremity  tenderness nor edema.  Neurologic:  Normal speech and language. No gross focal neurologic deficits are appreciated.    ED Results / Procedures / Treatments   Labs (all labs ordered are listed, but only abnormal results are displayed) Labs Reviewed  COMPREHENSIVE METABOLIC PANEL - Abnormal; Notable for the following components:      Result Value   CO2 20 (*)    Glucose, Bld 307 (*)    Creatinine, Ser 1.25 (*)    GFR, Estimated 50 (*)    All other components within normal limits  CBG MONITORING, ED - Abnormal; Notable for the following components:   Glucose-Capillary 334 (*)    All other components within normal limits  URINE CULTURE  CBC WITH DIFFERENTIAL/PLATELET  URINALYSIS, ROUTINE W REFLEX MICROSCOPIC  TROPONIN I (HIGH SENSITIVITY)  TROPONIN I (HIGH SENSITIVITY)     EKG  ED ECG REPORT I, Chesley Noon, the attending physician, personally viewed and interpreted this ECG.   Date: 07/19/2022  EKG Time: 21:43  Rate: 69  Rhythm: normal sinus rhythm  Axis: Normal  Intervals:none  ST&T Change: None  PROCEDURES:  Critical Care performed: No  Procedures   MEDICATIONS ORDERED IN ED: Medications  sodium chloride 0.9 % bolus 500 mL (500 mLs Intravenous Bolus 07/19/22 2236)     IMPRESSION / MDM / ASSESSMENT AND PLAN / ED COURSE  I reviewed the triage vital signs and the nursing notes.                              57 y.o. female with past medical history of hypertension, hyperlipidemia, diabetes, COPD, CHF, and seizures who presents to the ED complaining of dizziness and lightheadedness since returning home from the hospital earlier today.  Patient's presentation is most consistent with acute presentation with potential threat to life or bodily function.  Differential diagnosis includes, but is not limited to, dehydration, orthostatic hypotension, hyperglycemia, DKA, AKI, electrolyte abnormality, anemia, UTI.  Patient well-appearing and in no acute distress,  vital signs are unremarkable.  EKG shows no evidence of arrhythmia or ischemia and she has no focal neurologic deficits on exam.  Labs are reassuring and similar to earlier today with hyperglycemia but no evidence of DKA.  She has no acute electrolyte abnormality or AKI, no significant anemia or leukocytosis noted.  Urinalysis from earlier today reviewed and does appear concerning for infection, no culture data available and will attempt to recollect urinalysis to send for culture.  She received IV Rocephin earlier which should cover for today.  Patient was given additional IV fluids, but continues to feel lightheaded and dizzy with any attempted ambulation.  Case discussed with hospitalist for admission for ongoing IV fluid hydration and treatment of UTI.      FINAL CLINICAL IMPRESSION(S) / ED DIAGNOSES   Final diagnoses:  Acute cystitis without hematuria  Lightheadedness  Hyperglycemia     Rx / DC Orders   ED Discharge Orders     None        Note:  This document was prepared using Dragon voice recognition software and may include unintentional dictation errors.   Chesley Noon, MD 07/19/22 [phone]

## 2022-07-19 NOTE — H&P (Signed)
History and Physical    Patient: [name] UEA:[id number] DOB: 11-Oct-1965 DOA: 07/19/2022 DOS: the patient was seen and examined on 07/20/2022 PCP: [name], [name]  Patient coming from: Home  Chief Complaint: No chief complaint on file.  HPI: [name] is a 57 y.o. female with medical history significant of Past medical history type 2 diabetes, COPD, prior CVA (left sided deficits), CKD, hypertension, diastolic CHF, hyperlipidemia, seizures,  aortic root dilation, left eye blindness who arrived via EMS from home for generalized weakness and hyperglycemia found to be hypotensive by EMS.    EMS gave her 500 cc bolus for initial BP of 79/49 and a CBG of 367.  She has been having dysuria and foul-smelling odor from her urine for a while now.  She was seen in the emergency department earlier today and diagnosed with a urinary tract infection after receiving ceftriaxone and fluids patient went home.  After to returning home, she just did not feel right.  She was walking to kitchen to get her dinner and her granddaughter told her that she looked like she could not walk.  States that she almost fell.  Her blood sugar was greater than 300 at that time.  Endorses lightheadedness and reports if her blood sugar drops lower than 150.  Her last A1c was greater than 10.    BP as low as 81/52 and was at once tachypneic at previous ED visit however now her vital signs are normal including her blood pressure at 118/63.  CBC normal.  CMP with serum creatinine 1.25, glucose 307, bicarb 20 which is similar to her labs earlier today.  BHB 0.12.  pH 7.4.  Urinalysis with glucosuria and evidence of acute cystitis.  Urine culture obtained.  EKG showing sinus rhythm.  Chest x-ray unremarkable.  ED provider consulted the hospitalist team for admission evaluation.   Review of Systems: As mentioned in the history of present illness. All other systems reviewed and are negative. Past Medical History:   Diagnosis Date   Anxiety and depression 09/13/2006   Qualifier: Diagnosis of  By: Daphine Deutscher [name], [place]     Arthritis    joint pain    Asthma    COPD (chronic obstructive pulmonary disease) (HCC)    chronic bronchitis    Depression    Diabetes mellitus    since age 73; type 2 IDDM   Diabetic neuropathy, painful (HCC)    FEET AND HANDS   Diabetic retinopathy (HCC)    Diastolic dysfunction    a.  Echo 11/17: EF 60-65%, mild LVH, no RWMA, Gr1DD, mild AI, dilated aortic root measuring 38 mm, mildly dilated ascending aorta   Dilated aortic root (HCC)    38mm by echo 11/2015   Dizziness    secondary to diabetes and hypertension    Epilepsy idiopathic petit mal (HCC)    last seizure 2012;controlled w/ topomax   GERD (gastroesophageal reflux disease)    Glaucoma    NOT ON ANY EYE DROPS    Headache(784.0)    migraines    Heart murmur    born with    History of stress test    a. 11/17: Normal perfusion, EF 53%, normal study   Hx of blood clots    hematomas removed from left side of brain from 11mos to 57yrs old    Hyperlipidemia    Hypertension    Legally blind    left eye    MIGRAINE HEADACHE 09/14/2006   Qualifier: Diagnosis of  By: Daphine Deutscher [name], [place]kedtra     Myocardial infarction [facility])    a.  Patient reported history of without objective documentation   Nephrolithiasis    frequent urination , urination at nite  PT SEEN IN ER 09/22/11 FOR BACK AND RT SIDED PAIN--HAS KNOWN STONE RT URETER AND UA IN ER SHOWED UTI   Pain 09/23/2011   LOWER BACK AND RIGHT SIDE--PT HAS RIGHT URETERAL STONE   Pneumonia    hx of 2009   Pyelonephritis    SBO (small bowel obstruction) (HCC) 04/23/2021   Seizures (HCC)    Sleep apnea    [facility];does not use Cpap ; mild   Small bowel obstruction (HCC) 05/18/2021   Stress incontinence    Stroke (HCC)    last 2003  RESIDUAL LEFT LEG WEAKNESS--NO OTHER RESIDUAL PROBLEMS   Past Surgical History:  Procedure Laterality Date    BRAIN HEMATOMA EVACUATION     five procedures total, first procedure when 48 months old, last at 57 years of age.   CYSTOSCOPY W/ RETROGRADES  09/24/2011   Procedure: CYSTOSCOPY WITH RETROGRADE PYELOGRAM;  Surgeon: Anner Crete, MD;  Location: [facility];  Service: Urology;  Laterality: Right;   CYSTOSCOPY WITH URETEROSCOPY  09/24/2011   Procedure: CYSTOSCOPY WITH URETEROSCOPY;  Surgeon: Anner Crete, MD;  Location: [facility];  Service: Urology;  Laterality: Right;  Balloon dilation right ureter    CYSTOSCOPY/RETROGRADE/URETEROSCOPY  08/24/2011   Procedure: CYSTOSCOPY/RETROGRADE/URETEROSCOPY;  Surgeon: Milford Cage, MD;  Location: [facility];  Service: Urology;  Laterality: Right;  Cysto, Right retrograde Pyelogram, right stent placement.    INCISIONAL HERNIA REPAIR  05/05/2011   Procedure: LAPAROSCOPIC INCISIONAL HERNIA;  Surgeon: Emelia Loron, MD;  Location: [facility];  Service: General;  Laterality: N/A;   kidney stone removal     LAPAROTOMY N/A 06/12/2021   Procedure: EXPLORATORY LAPAROTOMY, LYSIS OF ADHESIONS;  Surgeon: Henrene Dodge, MD;  Location: [facility] ORS;  Service: General;  Laterality: N/A;   MASS EXCISION  05/05/2011   Procedure: EXCISION MASS;  Surgeon: Emelia Loron, MD;  Location: [facility] OR;  Service: General;  Laterality: Right;   [facility]     RETINAL DETACHMENT SURGERY Left 1990   RIGHT/LEFT HEART CATH AND CORONARY ANGIOGRAPHY N/A 04/28/2019   Procedure: RIGHT/LEFT HEART CATH AND CORONARY ANGIOGRAPHY;  Surgeon: Alwyn Pea, MD;  Location: [facility] INVASIVE CV LAB;  Service: Cardiovascular;  Laterality: N/A;   SHUNT REMOVAL     shunt inserted at age 47 removed at age 89    VAGINAL HYSTERECTOMY  24   Social History:  reports that she quit smoking about 35 years ago. Her smoking use included cigarettes. She has a 15.00 pack-year smoking history. She has never used smokeless tobacco. She reports current alcohol use of about 1.0 standard drink of alcohol per week. She reports that she does not  use drugs.  Allergies  Allergen Reactions   Codeine Swelling and Other (See Comments)    Swelling and burning of mouth (inside)   [place] [Dapagliflozin] Other (See Comments)    Recurrent UTI with farxiga   Lactose Intolerance (Gi) Nausea And Vomiting    Family History  Problem Relation Age of Onset   Uterine cancer Mother    Hypertension Mother    Cancer Mother    Brain cancer Maternal Grandmother    Hypertension Maternal Grandmother    Birth defects Daughter    Hypertension Daughter    Cirrhosis Maternal Grandfather    ADD / ADHD  Maternal Grandfather    Birth defects Maternal Grandfather    Diabetes Maternal Grandfather    Anesthesia problems Neg Hx    Colon cancer Neg Hx    Esophageal cancer Neg Hx    Pancreatic cancer Neg Hx     Prior to Admission medications   Medication Sig Start Date End Date Taking? Authorizing Provider  amitriptyline (ELAVIL) 25 MG tablet Take 2-3 tablets (50-75 mg total) by mouth at bedtime. 07/01/22 12/28/22  Edward Jolly, MD  baclofen (LIORESAL) 10 MG tablet TAKE 1 TABLET BY MOUTH THREE TIMES A DAY AS NEEDED FOR MUSCLE SPASMS 10/26/21   Berniece Salines, [name]  BD PEN NEEDLE NANO 2ND GEN 32G X 4 MM MISC USE WITH HUMALOG TO FILL PUMP DAILY 03/23/22   Reather Littler, MD  bisacodyl (DULCOLAX) 5 MG EC tablet Take 1 tablet (5 mg total) by mouth daily as needed for moderate constipation. 10/21/21   Meredeth Ide, MD  blood glucose meter kit and supplies KIT Dispense based on patient and insurance preference. Use up to four times daily as directed. (FOR E11.65 Z79.4). 11/17/21   [name], [name]  butalbital-acetaminophen-caffeine (FIORICET) [phone] MG tablet Take 1-2 tablets by mouth every 6 (six) hours as needed for headache. 07/01/22 07/01/23  Edward Jolly, MD  carvedilol (COREG) 25 MG tablet Take 25 mg by mouth 2 (two) times daily. 03/06/21   [provider]  cholecalciferol (VITAMIN D3) 25 MCG (1000 UNIT) tablet Take 1,000 Units by mouth daily.     [provider]  ciclopirox (PENLAC) 8 % solution Apply 1 Application topically at bedtime. 02/01/21   [provider]  ciprofloxacin (CIPRO) 500 MG tablet Take 1 tablet (500 mg total) by mouth 2 (two) times daily. 07/19/22   Jene Every, MD  cloNIDine (CATAPRES) 0.1 MG tablet Take 0.1 mg by mouth daily as needed (For blood pressure overm160/90).    [provider]  clopidogrel (PLAVIX) 75 MG tablet Take 1 tablet (75 mg total) by mouth daily. 10/10/19   Jamelle Haring, MD  cycloSPORINE (RESTASIS) 0.05 % ophthalmic emulsion Place 2 drops into both eyes 2 (two) times daily.    [provider]  Dulaglutide (TRULICITY) 4.5 MG/0.5ML SOPN Inject 4.5 mg as directed once a week. 06/29/22   [name], [name]  DULoxetine (CYMBALTA) 60 MG capsule Take 1 capsule (60 mg total) by mouth daily. 08/14/21   Berniece Salines, [name]  EPINEPHrine 0.3 mg/0.3 mL IJ SOAJ injection Inject 0.3 mg into the muscle as needed for anaphylaxis. 03/29/19   [provider]  ezetimibe (ZETIA) 10 MG tablet Take 1 tablet (10 mg total) by mouth daily. 08/14/21   Berniece Salines, [name]  furosemide (LASIX) 20 MG tablet Take 20 mg by mouth.    [provider]  gabapentin (NEURONTIN) 600 MG tablet 600 mg qAM, 600 mg qPM, 1200 mg QHS 07/01/22   Edward Jolly, MD  Glucagon (GVOKE HYPOPEN 2-PACK) 1 MG/0.2ML SOAJ INJECT 1 MG INTO THE SKIN ONCE AS NEEDED FOR UP TO 1 DOSE (HYPOGLYCEMIA). ONLY FOR SEVERE LOW BLOOD SUGAR NOT RESPONDING TO DRINKING JUICE/GLUCOSE TABLETS, WHEN CBG IS <70 05/22/22   [name], [name]  Glucose Blood (BLOOD GLUCOSE TEST STRIPS) STRP Use as directed to monitor blood sugar up to 4x a day FOR E11.65 Z79.4 11/17/21   [name], [name]  insulin glargine, 2 Unit Dial, (TOUJEO MAX SOLOSTAR) 300 UNIT/ML Solostar Pen 70 units in am 06/30/22   Reather Littler, MD  insulin  lispro (HUMALOG KWIKPEN) 100 UNIT/ML KwikPen Inject up to 30 units before each meal 06/30/22   Reather Littler, MD   Lancets MISC Use as directed to monitor blood sugar up to 4x a day FOR E11.65 Z79.4 11/17/21   [name], [name]  levETIRAcetam (KEPPRA) 500 MG tablet Take 500-750 mg by mouth See admin instructions. Take 1 tablet (500mg ) by mouth every morning and take 1 tablets by mouth (750mg ) by mouth every night    [provider]  meclizine (ANTIVERT) 25 MG tablet Take 1 tablet (25 mg total) by mouth 3 (three) times daily as needed for dizziness. 06/16/22   Trinna Post, MD  mirabegron ER (MYRBETRIQ) 50 MG TB24 tablet Take 1 tablet (50 mg total) by mouth daily. 08/20/20   Caro Laroche, DO  olmesartan (BENICAR) 40 MG tablet Take 1 tablet (40 mg total) by mouth daily. 10/21/21 10/21/22  Meredeth Ide, MD  pantoprazole (PROTONIX) 40 MG tablet Take 1 tablet (40 mg total) by mouth daily. 06/20/21   Enedina Finner, MD  polyethylene glycol (MIRALAX / GLYCOLAX) 17 g packet Take 17 g by mouth daily as needed for mild constipation. 10/21/21   Meredeth Ide, MD  rosuvastatin (CRESTOR) 10 MG tablet Take 1 tablet (10 mg total) by mouth daily. 08/14/21   Berniece Salines, [name]  senna-docusate (SENOKOT-S) 8.6-50 MG tablet Take 1 tablet by mouth at bedtime as needed for mild constipation. 10/21/21   Meredeth Ide, MD  sodium phosphate (FLEET) 7-19 GM/118ML ENEM Place 133 mLs (1 enema total) rectally daily as needed for severe constipation. 05/24/21   Pennie Banter, DO  WIXELA INHUB 250-50 MCG/DOSE AEPB Inhale 1 puff into the lungs 2 (two) times daily. 01/22/20   [provider]    Physical Exam: Vitals:   07/19/22 2141 07/19/22 2148  BP: 118/63   Pulse: 69   Resp: 17   Temp: 97.6 F (36.4 C)   TempSrc: Oral   SpO2: 100% 99%   GEN:     alert, cooperative and no distress    HENT:  mucus membranes moist, oropharyngeal without lesions or erythema,  nares patent, no nasal discharge  RESP:  clear to auscultation bilaterally, no increased work of breathing  CVS:   regular rate and rhythm,  distal pulses intact    ABD:  soft, generalized tenderness, bowel sounds present; no palpable masses, lower midline surgical scar EXT:   atraumatic, no edema, able to lift extremities  NEURO:  alert, oriented, speech normal, good EOM, no facial droop,  sensation grossly intact, strength 5/5 right UE and LE, 4/5 LUE and LLE, known left eye blindness Skin:   warm and dry, no rash Psych: Normal affect, appropriate speech and behavior   Data Reviewed: Relevant notes from primary care and specialist visits, past discharge summaries as available in EHR, including Care Everywhere. Prior diagnostic testing as pertinent to current admission diagnoses Updated medications and problem lists for reconciliation ED course, including vitals, labs, imaging, treatment and response to treatment Triage notes, nursing and pharmacy notes and ED provider's notes Notable results as noted in HPI  Assessment and Plan: Principal Problem:   UTI (urinary tract infection) Active Problems:   Orthostatic hypotension   Seizure (HCC)   Essential hypertension, benign   GERD without esophagitis   Chronic diastolic CHF (congestive heart failure) (HCC)   Dyslipidemia   Hemiparesis affecting left side as late effect of cerebrovascular accident (CVA) (HCC)   Uncontrolled type 2 diabetes mellitus with  hyperglycemia (HCC)  Urinary tract infection Urinalysis for this morning consistent with UTI. No leukocytosis or fever  -Place in observation, med/tele -S/P Ceftriaxone given at initial ED visit today, continue for now  Presyncope EKG with NSR without acute ST or T wave changes. Troponin negative. Pt with history of orthostatic hypotension in April 2023. Stopped hydrochlorothiazide at that admission -Orthostatic vitals q8h   Hypertension -Hold home Coreg, olmesartan, Lasix, in the setting of hypotension  Hyperglycemia with Insulin-dependent type 2 diabetes Blood sugar on admission 307.  Has sliding scale and 70 units of long-acting at  home. -Resistant sliding scale insulin  -Semglee 50u -Given 500 cc bolus    History of seizures Reports having a shunt as a child that was eventually removed  -Continue home Keppra 500 mg a.m. and 750 mg PM   History of CVA Patient with left-sided deficits   -Continue Plavix, Crestor  Hyperlipidemia  PAD  CAD -Continue home Crestor and Zetia  GERD -Continue Protonix   History of asthma -Albuterol nebs as needed  -Wixela inhaler held for now   Chronic pain -Continue home amitriptyline, gabapentin, Cymbalta -Hold home baclofen   Advance Care Planning:   Code Status: Full Code   Consults: Diabetes coordinator   Family Communication: None  Severity of Illness: The appropriate patient status for this patient is OBSERVATION. Observation status is judged to be reasonable and necessary in order to provide the required intensity of service to ensure the patient's safety. The patient's presenting symptoms, physical exam findings, and initial radiographic and laboratory data in the context of their medical condition is felt to place them at decreased risk for further clinical deterioration. Furthermore, it is anticipated that the patient will be medically stable for discharge from the hospital within 2 midnights of admission.   Author: Katha Cabal, DO 07/20/2022 12:55 AM  For on call review [email or web address].

## 2022-07-19 NOTE — ED Triage Notes (Addendum)
Pt arrives from via EMS w/ c/o hyperglycemia and hypotension. Initial BP 79/49, given NS and up to 95/52. CBG 367. Pt reporting light headed/dizziness.   Hx kidney issues, htn, CHF  EKG unremark

## 2022-07-19 NOTE — H&P (Incomplete)
History and Physical    Patient: [name] ZOX:[id number] DOB: 07-Nov-1965 DOA: 07/19/2022 DOS: the patient was seen and examined on 07/19/2022 PCP: [name], [name]  Patient coming from: Home  Chief Complaint: No chief complaint on file.  HPI: [name] is a 57 y.o. female with medical history significant of Past medical history type 2 diabetes, COPD, prior CVA (left sided deficits), CKD, hypertension, diastolic CHF, hyperlipidemia, seizures,  aortic root dilation, left eye blindness who arrived via EMS from home for generalized weakness and hyperglycemia found to be hypotensive by EMS.   EMS gave her 500 cc bolus for initial BP of 79/49 and a CBG of 367.  Endorses lightheadedness    Review of Systems: As mentioned in the history of present illness. All other systems reviewed and are negative. Past Medical History:  Diagnosis Date  . Anxiety and depression 09/13/2006   Qualifier: Diagnosis of  By: Daphine Deutscher [name], Zena Amos    . Arthritis    joint pain   . Asthma   . COPD (chronic obstructive pulmonary disease) (HCC)    chronic bronchitis   . Depression   . Diabetes mellitus    since age 1; type 2 IDDM  . Diabetic neuropathy, painful (HCC)    FEET AND HANDS  . Diabetic retinopathy (HCC)   . Diastolic dysfunction    a.  Echo 11/17: EF 60-65%, mild LVH, no RWMA, Gr1DD, mild AI, dilated aortic root measuring 38 mm, mildly dilated ascending aorta  . Dilated aortic root (HCC)    38mm by echo 11/2015  . Dizziness    secondary to diabetes and hypertension   . Epilepsy idiopathic petit mal (HCC)    last seizure 2012;controlled w/ topomax  . GERD (gastroesophageal reflux disease)   . Glaucoma    NOT ON ANY EYE DROPS   . Headache(784.0)    migraines   . Heart murmur    born with   . History of stress test    a. 11/17: Normal perfusion, EF 53%, normal study  . Hx of blood clots    hematomas removed from left side of brain from 11mos to 57yrs old   .  Hyperlipidemia   . Hypertension   . Legally blind    left eye   . MIGRAINE HEADACHE 09/14/2006   Qualifier: Diagnosis of  By: Daphine Deutscher [name], Zena Amos    . Myocardial infarction (HCC)    a.  Patient reported history of without objective documentation  . Nephrolithiasis    frequent urination , urination at nite  PT SEEN IN ER 09/22/11 FOR BACK AND RT SIDED PAIN--HAS KNOWN STONE RT URETER AND UA IN ER SHOWED UTI  . Pain 09/23/2011   LOWER BACK AND RIGHT SIDE--PT HAS RIGHT URETERAL STONE  . Pneumonia    hx of 2009  . Pyelonephritis   . SBO (small bowel obstruction) (HCC) 04/23/2021  . Seizures (HCC)   . Sleep apnea    [facility];does not use Cpap ; mild  . Small bowel obstruction (HCC) 05/18/2021  . Stress incontinence   . Stroke [facility])    last 2003  RESIDUAL LEFT LEG WEAKNESS--NO OTHER RESIDUAL PROBLEMS   Past Surgical History:  Procedure Laterality Date  . BRAIN HEMATOMA EVACUATION     five procedures total, first procedure when 62 months old, last at 57 years of age.  . CYSTOSCOPY W/ RETROGRADES  09/24/2011   Procedure: CYSTOSCOPY WITH RETROGRADE PYELOGRAM;  Surgeon: Anner Crete, MD;  Location: [facility];  Service: Urology;  Laterality: Right;  . CYSTOSCOPY WITH URETEROSCOPY  09/24/2011   Procedure: CYSTOSCOPY WITH URETEROSCOPY;  Surgeon: Anner Crete, MD;  Location: [facility];  Service: Urology;  Laterality: Right;  Balloon dilation right ureter   . CYSTOSCOPY/RETROGRADE/URETEROSCOPY  08/24/2011   Procedure: CYSTOSCOPY/RETROGRADE/URETEROSCOPY;  Surgeon: Milford Cage, MD;  Location: [facility];  Service: Urology;  Laterality: Right;  Cysto, Right retrograde Pyelogram, right stent placement.   Sherald Hess HERNIA REPAIR  05/05/2011   Procedure: LAPAROSCOPIC INCISIONAL HERNIA;  Surgeon: Emelia Loron, MD;  Location: [facility];  Service: General;  Laterality: N/A;  . kidney stone removal    . LAPAROTOMY N/A 06/12/2021   Procedure: EXPLORATORY LAPAROTOMY, LYSIS OF ADHESIONS;   Surgeon: Henrene Dodge, MD;  Location: [facility] ORS;  Service: General;  Laterality: N/A;  . MASS EXCISION  05/05/2011   Procedure: EXCISION MASS;  Surgeon: Emelia Loron, MD;  Location: [facility] OR;  Service: General;  Laterality: Right;  . PCNL    . RETINAL DETACHMENT SURGERY Left 1990  . RIGHT/LEFT HEART CATH AND CORONARY ANGIOGRAPHY N/A 04/28/2019   Procedure: RIGHT/LEFT HEART CATH AND CORONARY ANGIOGRAPHY;  Surgeon: Alwyn Pea, MD;  Location: [facility] INVASIVE CV LAB;  Service: Cardiovascular;  Laterality: N/A;  . SHUNT REMOVAL     shunt inserted at age 36 removed at age 23   . VAGINAL HYSTERECTOMY  1996   Social History:  reports that she quit smoking about 35 years ago. Her smoking use included cigarettes. She has a 15.00 pack-year smoking history. She has never used smokeless tobacco. She reports current alcohol use of about 1.0 standard drink of alcohol per week. She reports that she does not use drugs.  Allergies  Allergen Reactions  . Codeine Swelling and Other (See Comments)    Swelling and burning of mouth (inside)  . Marcelline Deist [Dapagliflozin] Other (See Comments)    Recurrent UTI with farxiga  . Lactose Intolerance (Gi) Nausea And Vomiting    Family History  Problem Relation Age of Onset  . Uterine cancer Mother   . Hypertension Mother   . Cancer Mother   . Brain cancer Maternal Grandmother   . Hypertension Maternal Grandmother   . Birth defects Daughter   . Hypertension Daughter   . Cirrhosis Maternal Grandfather   . ADD / ADHD Maternal Grandfather   . Birth defects Maternal Grandfather   . Diabetes Maternal Grandfather   . Anesthesia problems Neg Hx   . Colon cancer Neg Hx   . Esophageal cancer Neg Hx   . Pancreatic cancer Neg Hx     Prior to Admission medications   Medication Sig Start Date End Date Taking? Authorizing Provider  amitriptyline (ELAVIL) 25 MG tablet Take 2-3 tablets (50-75 mg total) by mouth at bedtime. 07/01/22 12/28/22  Edward Jolly, MD  baclofen  (LIORESAL) 10 MG tablet TAKE 1 TABLET BY MOUTH THREE TIMES A DAY AS NEEDED FOR MUSCLE SPASMS 10/26/21   Berniece Salines, [name]  BD PEN NEEDLE NANO 2ND GEN 32G X 4 MM MISC USE WITH HUMALOG TO FILL PUMP DAILY 03/23/22   Reather Littler, MD  bisacodyl (DULCOLAX) 5 MG EC tablet Take 1 tablet (5 mg total) by mouth daily as needed for moderate constipation. 10/21/21   Meredeth Ide, MD  blood glucose meter kit and supplies KIT Dispense based on patient and insurance preference. Use up to four times daily as directed. (FOR E11.65 Z79.4). 11/17/21   [name], [name]  butalbital-acetaminophen-caffeine (  FIORICET) 50-325-40 MG tablet Take 1-2 tablets by mouth every 6 (six) hours as needed for headache. 07/01/22 07/01/23  Edward Jolly, MD  carvedilol (COREG) 25 MG tablet Take 25 mg by mouth 2 (two) times daily. 03/06/21   [provider]  cholecalciferol (VITAMIN D3) 25 MCG (1000 UNIT) tablet Take 1,000 Units by mouth daily.    [provider]  ciclopirox (PENLAC) 8 % solution Apply 1 Application topically at bedtime. 02/01/21   [provider]  ciprofloxacin (CIPRO) 500 MG tablet Take 1 tablet (500 mg total) by mouth 2 (two) times daily. 07/19/22   Jene Every, MD  cloNIDine (CATAPRES) 0.1 MG tablet Take 0.1 mg by mouth daily as needed (For blood pressure overm160/90).    [provider]  clopidogrel (PLAVIX) 75 MG tablet Take 1 tablet (75 mg total) by mouth daily. 10/10/19   Jamelle Haring, MD  cycloSPORINE (RESTASIS) 0.05 % ophthalmic emulsion Place 2 drops into both eyes 2 (two) times daily.    [provider]  Dulaglutide (TRULICITY) 4.5 MG/0.5ML SOPN Inject 4.5 mg as directed once a week. 06/29/22   [name], [name]  DULoxetine (CYMBALTA) 60 MG capsule Take 1 capsule (60 mg total) by mouth daily. 08/14/21   Berniece Salines, [name]  EPINEPHrine 0.3 mg/0.3 mL IJ SOAJ injection Inject 0.3 mg into the muscle as needed for anaphylaxis. 03/29/19   [provider]   ezetimibe (ZETIA) 10 MG tablet Take 1 tablet (10 mg total) by mouth daily. 08/14/21   Berniece Salines, [name]  furosemide (LASIX) 20 MG tablet Take 20 mg by mouth.    [provider]  gabapentin (NEURONTIN) 600 MG tablet 600 mg qAM, 600 mg qPM, 1200 mg QHS 07/01/22   Edward Jolly, MD  Glucagon (GVOKE HYPOPEN 2-PACK) 1 MG/0.2ML SOAJ INJECT 1 MG INTO THE SKIN ONCE AS NEEDED FOR UP TO 1 DOSE (HYPOGLYCEMIA). ONLY FOR SEVERE LOW BLOOD SUGAR NOT RESPONDING TO DRINKING JUICE/GLUCOSE TABLETS, WHEN CBG IS <70 05/22/22   [name], [name]  Glucose Blood (BLOOD GLUCOSE TEST STRIPS) STRP Use as directed to monitor blood sugar up to 4x a day FOR E11.65 Z79.4 11/17/21   [name], [name]  insulin glargine, 2 Unit Dial, (TOUJEO MAX SOLOSTAR) 300 UNIT/ML Solostar Pen 70 units in am 06/30/22   Reather Littler, MD  insulin lispro (HUMALOG KWIKPEN) 100 UNIT/ML KwikPen Inject up to 30 units before each meal 06/30/22   Reather Littler, MD  Lancets MISC Use as directed to monitor blood sugar up to 4x a day FOR E11.65 Z79.4 11/17/21   [name], [name]  levETIRAcetam (KEPPRA) 500 MG tablet Take 500-750 mg by mouth See admin instructions. Take 1 tablet (500mg ) by mouth every morning and take 1 tablets by mouth (750mg ) by mouth every night    [provider]  meclizine (ANTIVERT) 25 MG tablet Take 1 tablet (25 mg total) by mouth 3 (three) times daily as needed for dizziness. 06/16/22   Trinna Post, MD  mirabegron ER (MYRBETRIQ) 50 MG TB24 tablet Take 1 tablet (50 mg total) by mouth daily. 08/20/20   Caro Laroche, DO  olmesartan (BENICAR) 40 MG tablet Take 1 tablet (40 mg total) by mouth daily. 10/21/21 10/21/22  Meredeth Ide, MD  pantoprazole (PROTONIX) 40 MG tablet Take 1 tablet (40 mg total) by mouth daily. 06/20/21   Enedina Finner, MD  polyethylene glycol (MIRALAX / GLYCOLAX) 17 g packet Take 17 g by mouth daily as needed for mild constipation.  10/21/21   Meredeth Ide, MD  rosuvastatin (CRESTOR) 10 MG tablet Take 1  tablet (10 mg total) by mouth daily. 08/14/21   Berniece Salines, [name]  senna-docusate (SENOKOT-S) 8.6-50 MG tablet Take 1 tablet by mouth at bedtime as needed for mild constipation. 10/21/21   Meredeth Ide, MD  sodium phosphate (FLEET) 7-19 GM/118ML ENEM Place 133 mLs (1 enema total) rectally daily as needed for severe constipation. 05/24/21   Pennie Banter, DO  WIXELA INHUB 250-50 MCG/DOSE AEPB Inhale 1 puff into the lungs 2 (two) times daily. 01/22/20   [provider]    Physical Exam: Vitals:   07/19/22 2141 07/19/22 2148  BP: 118/63   Pulse: 69   Resp: 17   Temp: 97.6 F (36.4 C)   TempSrc: Oral   SpO2: 100% 99%   GEN:     alert, cooperative and no distress ***   HENT:  mucus membranes moist, oropharyngeal without lesions or erythema ***,  nares patent, no nasal discharge *** EYES:   pupils equal and reactive, EOM intact NECK:  supple, normal ROM, no lymphadenopathy *** RESP:  clear to auscultation bilaterally, no increased work of breathing *** CVS:   regular rate and rhythm, no murmur, distal pulses intact  *** ABD:  soft, non-tender; bowel sounds present; no palpable masses,  *** GU:  normal female/female *** EXT:   normal ROM, atraumatic, no edema *** NEURO:  normal without focal findings,  speech normal, alert and oriented  *** Skin:   warm and dry, no rash***, normal*** skin turgor Psych: Normal affect, appropriate speech and behavior   Data Reviewed: Relevant notes from primary care and specialist visits, past discharge summaries as available in EHR, including Care Everywhere. Prior diagnostic testing as pertinent to current admission diagnoses Updated medications and problem lists for reconciliation ED course, including vitals, labs, imaging, treatment and response to treatment Triage notes, nursing and pharmacy notes and ED provider's notes Notable results as noted in HPI  Assessment and Plan: Principal Problem:   UTI (urinary tract infection) Active  Problems:   Orthostatic hypotension   Seizure (HCC)   OSA (obstructive sleep apnea)   Essential hypertension, benign   GERD without esophagitis   Chronic diastolic CHF (congestive heart failure) (HCC)   CAD (coronary artery disease)   Major depressive disorder, recurrent episode, moderate (HCC)   Dyslipidemia   Hemiparesis affecting left side as late effect of cerebrovascular accident (CVA) (HCC)   Dilated aortic root (HCC)   Overactive bladder   PAD (peripheral artery disease) (HCC)   Uncontrolled type 2 diabetes mellitus with hyperglycemia (HCC)   Insulin dependent type 2 diabetes mellitus (HCC)  Urinary tract infection Urinalysis for this morning consistent with UTI. No leukocytosis or fever  -Place in observation, med/tele -S/P Ceftriaxone given at initial ED visit today  Presyncope EKG with NSR without acute ST or T wave changes. Troponin negative. Pt with history of orthostatic hypotension in April 2023. Stopped hydrochlorothiazide at that admission -Orthostatic vitals q8h   Hypertension -Hold home Coreg, olmesartan, Lasix, spironolactone***in the setting of hypotension  Hyperglycemia with Insulin-dependent type 2 diabetes Blood sugar on admission 307. -Resistant sliding scale insulin *** -Given 500 cc bolus    History of seizures -Continue home Keppra 500 mg a.m. and 750 mg PM ***  History of CVA Patient with no left-sided deficits.   -Continue Plavix, Crestor***  Hyperlipidemia -Continue home Crestor and Zetia, ***  GERD -Continue Protonix ***  History of asthma -Albuterol  nebs as needed  -Wixela inhaler held for now    Advance Care Planning:   Code Status: Prior ***  Consults: Diabetes coordinator   Family Communication: ***  Severity of Illness: The appropriate patient status for this patient is OBSERVATION. Observation status is judged to be reasonable and necessary in order to provide the required intensity of service to ensure the patient's  safety. The patient's presenting symptoms, physical exam findings, and initial radiographic and laboratory data in the context of their medical condition is felt to place them at decreased risk for further clinical deterioration. Furthermore, it is anticipated that the patient will be medically stable for discharge from the hospital within 2 midnights of admission.   Author: Katha Cabal, DO 07/19/2022 11:24 PM  For on call review [email or web address].

## 2022-07-19 NOTE — ED Provider Notes (Signed)
Lab work reviewed, urinalysis consistent with UTI.  Patient's blood pressure is improved, she is afebrile, normal white blood cell count, no tachycardia.  Review of blood pressures in the past demonstrates that she frequently has low blood pressures.  Treated with dose of IV Rocephin here, she is well-appearing and in no acute distress and appropriate for discharge with outpatient follow-up, return precautions discussed   Jene Every, MD 07/19/22 1805

## 2022-07-20 ENCOUNTER — Encounter: Payer: Self-pay | Admitting: Family Medicine

## 2022-07-20 DIAGNOSIS — J45909 Unspecified asthma, uncomplicated: Secondary | ICD-10-CM | POA: Diagnosis not present

## 2022-07-20 DIAGNOSIS — I10 Essential (primary) hypertension: Secondary | ICD-10-CM

## 2022-07-20 DIAGNOSIS — I130 Hypertensive heart and chronic kidney disease with heart failure and stage 1 through stage 4 chronic kidney disease, or unspecified chronic kidney disease: Secondary | ICD-10-CM | POA: Diagnosis not present

## 2022-07-20 DIAGNOSIS — Z8673 Personal history of transient ischemic attack (TIA), and cerebral infarction without residual deficits: Secondary | ICD-10-CM | POA: Diagnosis not present

## 2022-07-20 DIAGNOSIS — N3000 Acute cystitis without hematuria: Secondary | ICD-10-CM | POA: Diagnosis not present

## 2022-07-20 DIAGNOSIS — I951 Orthostatic hypotension: Secondary | ICD-10-CM | POA: Diagnosis not present

## 2022-07-20 DIAGNOSIS — N189 Chronic kidney disease, unspecified: Secondary | ICD-10-CM | POA: Diagnosis not present

## 2022-07-20 DIAGNOSIS — R739 Hyperglycemia, unspecified: Secondary | ICD-10-CM

## 2022-07-20 DIAGNOSIS — E1122 Type 2 diabetes mellitus with diabetic chronic kidney disease: Secondary | ICD-10-CM | POA: Diagnosis not present

## 2022-07-20 DIAGNOSIS — J449 Chronic obstructive pulmonary disease, unspecified: Secondary | ICD-10-CM | POA: Diagnosis not present

## 2022-07-20 DIAGNOSIS — E1169 Type 2 diabetes mellitus with other specified complication: Secondary | ICD-10-CM | POA: Diagnosis not present

## 2022-07-20 DIAGNOSIS — I69354 Hemiplegia and hemiparesis following cerebral infarction affecting left non-dominant side: Secondary | ICD-10-CM | POA: Diagnosis not present

## 2022-07-20 DIAGNOSIS — Z794 Long term (current) use of insulin: Secondary | ICD-10-CM | POA: Diagnosis not present

## 2022-07-20 DIAGNOSIS — E1165 Type 2 diabetes mellitus with hyperglycemia: Secondary | ICD-10-CM | POA: Diagnosis not present

## 2022-07-20 DIAGNOSIS — E860 Dehydration: Secondary | ICD-10-CM | POA: Diagnosis not present

## 2022-07-20 DIAGNOSIS — G4733 Obstructive sleep apnea (adult) (pediatric): Secondary | ICD-10-CM | POA: Diagnosis not present

## 2022-07-20 DIAGNOSIS — Z79899 Other long term (current) drug therapy: Secondary | ICD-10-CM | POA: Diagnosis not present

## 2022-07-20 DIAGNOSIS — I5032 Chronic diastolic (congestive) heart failure: Secondary | ICD-10-CM | POA: Diagnosis not present

## 2022-07-20 DIAGNOSIS — Z87891 Personal history of nicotine dependence: Secondary | ICD-10-CM | POA: Diagnosis not present

## 2022-07-20 LAB — COMPREHENSIVE METABOLIC PANEL
ALT: 19 U/L (ref 0–44)
AST: 18 U/L (ref 15–41)
Albumin: 3.1 g/dL — ABNORMAL LOW (ref 3.5–5.0)
Alkaline Phosphatase: 83 U/L (ref 38–126)
Anion gap: 9 (ref 5–15)
BUN: 21 mg/dL — ABNORMAL HIGH (ref 6–20)
CO2: 24 mmol/L (ref 22–32)
Calcium: 8.7 mg/dL — ABNORMAL LOW (ref 8.9–10.3)
Chloride: 106 mmol/L (ref 98–111)
Creatinine, Ser: 0.99 mg/dL (ref 0.44–1.00)
GFR, Estimated: >60 mL/min (ref >60)
Glucose, Bld: 253 mg/dL — ABNORMAL HIGH (ref 70–99)
Potassium: 3.8 mmol/L (ref 3.5–5.1)
Sodium: 139 mmol/L (ref 135–145)
Total Bilirubin: 0.2 mg/dL — ABNORMAL LOW (ref 0.3–1.2)
Total Protein: 6.3 g/dL — ABNORMAL LOW (ref 6.5–8.1)

## 2022-07-20 LAB — CBC
HCT: 39.1 % (ref 36.0–46.0)
Hemoglobin: 12.1 g/dL (ref 12.0–15.0)
MCH: 29 pg (ref 26.0–34.0)
MCHC: 30.9 g/dL (ref 30.0–36.0)
MCV: 93.8 fL (ref 80.0–100.0)
Platelets: 227 10*3/uL (ref 150–400)
RBC: 4.17 MIL/uL (ref 3.87–5.11)
RDW: 13.7 % (ref 11.5–15.5)
WBC: 6.7 10*3/uL (ref 4.0–10.5)
nRBC: 0 % (ref 0.0–0.2)

## 2022-07-20 LAB — GLUCOSE, CAPILLARY
Glucose-Capillary: 184 mg/dL — ABNORMAL HIGH (ref 70–99)
Glucose-Capillary: 252 mg/dL — ABNORMAL HIGH (ref 70–99)
Glucose-Capillary: 230 mg/dL — ABNORMAL HIGH (ref 70–99)
Glucose-Capillary: 200 mg/dL — ABNORMAL HIGH (ref 70–99)
Glucose-Capillary: 177 mg/dL — ABNORMAL HIGH (ref 70–99)
Glucose-Capillary: 172 mg/dL — ABNORMAL HIGH (ref 70–99)

## 2022-07-20 LAB — TROPONIN I (HIGH SENSITIVITY): Troponin I (High Sensitivity): 9 ng/L (ref <18)

## 2022-07-20 MED ORDER — ALBUTEROL SULFATE (2.5 MG/3ML) 0.083% IN NEBU: 2.5000 mg | INHALATION_SOLUTION | Freq: Four times a day (QID) | RESPIRATORY_TRACT | Status: DC | PRN

## 2022-07-20 MED ORDER — INSULIN GLARGINE-YFGN 100 UNIT/ML ~~LOC~~ SOLN
50.0000 [IU] | Freq: Every day | SUBCUTANEOUS | Status: DC
  Administered 2022-07-20 – 2022-07-21 (×3): 50 [IU] via SUBCUTANEOUS
  Filled 2022-07-20 (×4): qty 0.5

## 2022-07-20 MED ORDER — ACETAMINOPHEN 325 MG PO TABS: 650.0000 mg | ORAL_TABLET | Freq: Four times a day (QID) | ORAL | Status: DC | PRN

## 2022-07-20 MED ORDER — POLYETHYLENE GLYCOL 3350 17 G PO PACK
17.0000 g | PACK | Freq: Every day | ORAL | Status: DC
  Administered 2022-07-20 – 2022-07-22 (×3): 17 g via ORAL
  Filled 2022-07-20 (×3): qty 1

## 2022-07-20 MED ORDER — ALBUTEROL SULFATE (2.5 MG/3ML) 0.083% IN NEBU
2.5000 mg | INHALATION_SOLUTION | Freq: Four times a day (QID) | RESPIRATORY_TRACT | Status: DC
  Administered 2022-07-20 (×2): 2.5 mg via RESPIRATORY_TRACT
  Filled 2022-07-20 (×2): qty 3

## 2022-07-20 MED ORDER — LEVETIRACETAM 500 MG PO TABS
500.0000 mg | ORAL_TABLET | Freq: Every day | ORAL | Status: DC
  Administered 2022-07-20 – 2022-07-22 (×3): 500 mg via ORAL
  Filled 2022-07-20 (×3): qty 1

## 2022-07-20 MED ORDER — ONDANSETRON HCL 4 MG PO TABS: 4.0000 mg | ORAL_TABLET | Freq: Four times a day (QID) | ORAL | Status: DC | PRN

## 2022-07-20 MED ORDER — ROSUVASTATIN CALCIUM 10 MG PO TABS
10.0000 mg | ORAL_TABLET | Freq: Every day | ORAL | Status: DC
  Administered 2022-07-20 – 2022-07-22 (×3): 10 mg via ORAL
  Filled 2022-07-20 (×3): qty 1

## 2022-07-20 MED ORDER — MIRABEGRON ER 50 MG PO TB24
50.0000 mg | ORAL_TABLET | Freq: Every day | ORAL | Status: DC
  Administered 2022-07-20 – 2022-07-22 (×3): 50 mg via ORAL
  Filled 2022-07-20 (×3): qty 1

## 2022-07-20 MED ORDER — GABAPENTIN 300 MG PO CAPS
1200.0000 mg | ORAL_CAPSULE | Freq: Every day | ORAL | Status: DC
  Administered 2022-07-20 – 2022-07-21 (×2): 1200 mg via ORAL
  Filled 2022-07-20 (×2): qty 4

## 2022-07-20 MED ORDER — GABAPENTIN 600 MG PO TABS: 600.0000 mg | ORAL_TABLET | Freq: Three times a day (TID) | ORAL | Status: DC

## 2022-07-20 MED ORDER — ONDANSETRON HCL 4 MG/2ML IJ SOLN: 4.0000 mg | Freq: Four times a day (QID) | INTRAMUSCULAR | Status: DC | PRN

## 2022-07-20 MED ORDER — AMITRIPTYLINE HCL 25 MG PO TABS
75.0000 mg | ORAL_TABLET | Freq: Every day | ORAL | Status: DC
  Administered 2022-07-20 – 2022-07-21 (×2): 75 mg via ORAL
  Filled 2022-07-20 (×2): qty 3

## 2022-07-20 MED ORDER — INSULIN ASPART 100 UNIT/ML IJ SOLN
0.0000 [IU] | Freq: Three times a day (TID) | INTRAMUSCULAR | Status: DC
  Administered 2022-07-20 (×2): 4 [IU] via SUBCUTANEOUS
  Administered 2022-07-20: 7 [IU] via SUBCUTANEOUS
  Administered 2022-07-21: 4 [IU] via SUBCUTANEOUS
  Administered 2022-07-21: 7 [IU] via SUBCUTANEOUS
  Administered 2022-07-21: 3 [IU] via SUBCUTANEOUS
  Administered 2022-07-22: 7 [IU] via SUBCUTANEOUS
  Filled 2022-07-20 (×7): qty 1

## 2022-07-20 MED ORDER — SODIUM CHLORIDE 0.9 % IV BOLUS
500.0000 mL | Freq: Once | INTRAVENOUS | Status: AC
  Administered 2022-07-20: 500 mL via INTRAVENOUS

## 2022-07-20 MED ORDER — MORPHINE SULFATE (PF) 2 MG/ML IV SOLN
1.0000 mg | INTRAVENOUS | Status: DC | PRN
  Administered 2022-07-20: 1 mg via INTRAVENOUS
  Filled 2022-07-20: qty 1

## 2022-07-20 MED ORDER — GABAPENTIN 300 MG PO CAPS
600.0000 mg | ORAL_CAPSULE | Freq: Two times a day (BID) | ORAL | Status: DC
  Administered 2022-07-20 – 2022-07-22 (×6): 600 mg via ORAL
  Filled 2022-07-20 (×6): qty 2

## 2022-07-20 MED ORDER — CARVEDILOL 6.25 MG PO TABS
12.5000 mg | ORAL_TABLET | Freq: Two times a day (BID) | ORAL | Status: DC
  Administered 2022-07-20 – 2022-07-22 (×5): 12.5 mg via ORAL
  Filled 2022-07-20 (×5): qty 2

## 2022-07-20 MED ORDER — OXYCODONE-ACETAMINOPHEN 5-325 MG PO TABS
1.0000 | ORAL_TABLET | Freq: Four times a day (QID) | ORAL | Status: DC | PRN
  Administered 2022-07-20: 1 via ORAL
  Filled 2022-07-20: qty 1

## 2022-07-20 MED ORDER — DULOXETINE HCL 30 MG PO CPEP
60.0000 mg | ORAL_CAPSULE | Freq: Every day | ORAL | Status: DC
  Administered 2022-07-20 – 2022-07-22 (×3): 60 mg via ORAL
  Filled 2022-07-20 (×3): qty 2

## 2022-07-20 MED ORDER — POLYETHYLENE GLYCOL 3350 17 G PO PACK: 17.0000 g | PACK | Freq: Every day | ORAL | Status: DC | PRN

## 2022-07-20 MED ORDER — ACETAMINOPHEN 650 MG RE SUPP: 650.0000 mg | Freq: Four times a day (QID) | RECTAL | Status: DC | PRN

## 2022-07-20 MED ORDER — EZETIMIBE 10 MG PO TABS
10.0000 mg | ORAL_TABLET | Freq: Every day | ORAL | Status: DC
  Administered 2022-07-20 – 2022-07-22 (×3): 10 mg via ORAL
  Filled 2022-07-20 (×3): qty 1

## 2022-07-20 MED ORDER — DOCUSATE SODIUM 100 MG PO CAPS
200.0000 mg | ORAL_CAPSULE | Freq: Two times a day (BID) | ORAL | Status: DC
  Administered 2022-07-20 – 2022-07-22 (×5): 200 mg via ORAL
  Filled 2022-07-20 (×5): qty 2

## 2022-07-20 MED ORDER — LEVETIRACETAM 750 MG PO TABS
750.0000 mg | ORAL_TABLET | Freq: Every day | ORAL | Status: DC
  Administered 2022-07-20 – 2022-07-21 (×2): 750 mg via ORAL
  Filled 2022-07-20 (×3): qty 1

## 2022-07-20 MED ORDER — BISACODYL 5 MG PO TBEC
10.0000 mg | DELAYED_RELEASE_TABLET | Freq: Every day | ORAL | Status: DC | PRN
  Administered 2022-07-21: 10 mg via ORAL
  Filled 2022-07-20: qty 2

## 2022-07-20 MED ORDER — MECLIZINE HCL 25 MG PO TABS
25.0000 mg | ORAL_TABLET | Freq: Two times a day (BID) | ORAL | Status: DC | PRN
  Administered 2022-07-20 – 2022-07-22 (×4): 25 mg via ORAL
  Filled 2022-07-20 (×6): qty 1

## 2022-07-20 MED ORDER — CLOPIDOGREL BISULFATE 75 MG PO TABS
75.0000 mg | ORAL_TABLET | Freq: Every day | ORAL | Status: DC
  Administered 2022-07-20 – 2022-07-22 (×3): 75 mg via ORAL
  Filled 2022-07-20 (×3): qty 1

## 2022-07-20 MED ORDER — PANTOPRAZOLE SODIUM 40 MG PO TBEC
40.0000 mg | DELAYED_RELEASE_TABLET | Freq: Every day | ORAL | Status: DC
  Administered 2022-07-20 – 2022-07-22 (×3): 40 mg via ORAL
  Filled 2022-07-20 (×3): qty 1

## 2022-07-20 MED ORDER — ENOXAPARIN SODIUM 60 MG/0.6ML IJ SOSY
55.0000 mg | PREFILLED_SYRINGE | Freq: Every day | INTRAMUSCULAR | Status: DC
  Administered 2022-07-21: 55 mg via SUBCUTANEOUS
  Filled 2022-07-20: qty 0.6

## 2022-07-20 MED ORDER — SODIUM CHLORIDE 0.9 % IV SOLN
1.0000 g | INTRAVENOUS | Status: DC
  Administered 2022-07-20 – 2022-07-21 (×2): 1 g via INTRAVENOUS
  Filled 2022-07-20 (×3): qty 10

## 2022-07-20 MED ORDER — AMITRIPTYLINE HCL 50 MG PO TABS: 50.0000 mg | ORAL_TABLET | Freq: Every day | ORAL | Status: DC

## 2022-07-20 NOTE — Plan of Care (Signed)
  Problem: Education: Goal: Knowledge of General Education information will improve Description: Including pain rating scale, medication(s)/side effects and non-pharmacologic comfort measures Outcome: Completed/Met   Problem: Activity: Goal: Risk for activity intolerance will decrease Outcome: Progressing   Problem: Nutrition: Goal: Adequate nutrition will be maintained Outcome: Completed/Met   Problem: Coping: Goal: Level of anxiety will decrease Outcome: Progressing   Problem: Elimination: Goal: Will not experience complications related to bowel motility Outcome: Progressing   Problem: Pain Managment: Goal: General experience of comfort will improve Outcome: Completed/Met   Problem: Skin Integrity: Goal: Risk for impaired skin integrity will decrease Outcome: Completed/Met   Problem: Activity: Goal: Risk for activity intolerance will decrease Outcome: Progressing   Problem: Nutrition: Goal: Adequate nutrition will be maintained Outcome: Completed/Met   Problem: Coping: Goal: Level of anxiety will decrease Outcome: Progressing   Problem: Coping: Goal: Level of anxiety will decrease Outcome: Progressing   Problem: Elimination: Goal: Will not experience complications related to bowel motility Outcome: Progressing   Problem: Pain Managment: Goal: General experience of comfort will improve Outcome: Completed/Met   Problem: Safety: Goal: Ability to remain free from injury will improve Outcome: Progressing   Problem: Skin Integrity: Goal: Risk for impaired skin integrity will decrease Outcome: Completed/Met

## 2022-07-20 NOTE — Evaluation (Signed)
Physical Therapy Evaluation Patient Details Name: Zaliah Feldhaus MRN: [id number] DOB: Jun 01, 1965 Today's Date: 07/20/2022  History of Present Illness  57 y.o. female arrived via EMS from home for generalized weakness, dizziness, and hyperglycemia found to be hypotensive by EMS. Admitted for UTI. PMH: COPD, DM, HTN, GAD, recent UTI, OSA on CPAP, CVA (L side deficits), CKD, diastolic CHF, HLD, seizures, L eye blindness.   Clinical Impression  Pt A&Ox4, reported chronic back pain as 6/10. Per pt at baseline she is ambulatory with rollator for community ambulation and uses a quad cane for in home, modI for ADLs/some IADLs. Family assists with transportation and cleaning as needed. She was able to perform sit <> stand with RW with CGA, and ambulated ~142ft CGA-supervision. No LOB noted. Pt did report that her BLE still feel weak compared to baseline but not currently interested in follow up therapy until they move homes. PT to sign off, no acute PT needs.         Assistance Recommended at Discharge PRN  If plan is discharge home, recommend the following:  Can travel by private vehicle  Assist for transportation;Help with stairs or ramp for entrance        Equipment Recommendations None recommended by PT  Recommendations for Other Services       Functional Status Assessment       Precautions / Restrictions Precautions Precautions: Fall Restrictions Weight Bearing Restrictions: No      Mobility  Bed Mobility                    Transfers Overall transfer level: Needs assistance Equipment used: Rolling walker (2 wheels) Transfers: Sit to/from Stand Sit to Stand: Min guard                Ambulation/Gait Ambulation/Gait assistance: Min guard, Supervision Gait Distance (Feet): 120 Feet Assistive device: Rolling walker (2 wheels)         General Gait Details: no LOB, WFLs  Stairs            Wheelchair Mobility     Tilt Bed    Modified Rankin  (Stroke Patients Only)       Balance Overall balance assessment: Needs assistance Sitting-balance support: Feet supported, Single extremity supported, No upper extremity supported Sitting balance-Leahy Scale: Good     Standing balance support: Bilateral upper extremity supported, During functional activity, Reliant on assistive device for balance Standing balance-Leahy Scale: Fair                               Pertinent Vitals/Pain Pain Assessment Pain Assessment: Faces Pain Score: 6  Pain Location: chronic BLE Pain Descriptors / Indicators: Aching Pain Intervention(s): Limited activity within patient's tolerance, Monitored during session, Repositioned    Home Living Family/patient expects to be discharged to:: Private residence Living Arrangements: Children (lives wiht dtr) Available Help at Discharge: Family Type of Home: House Home Access: Stairs to enter Entrance Stairs-Rails: None Entrance Stairs-Number of Steps: 1 +1   Home Layout: One level Home Equipment: Architectural technologist (4 wheels);BSC/3in1;Shower seat;Grab bars - tub/shower;Cane - single point      Prior Function               Mobility Comments: QC for in home, rollator for community ambulation ADLs Comments: PRN assist from granddaughter for dressing; tries not to ask for help with showering. stated she does her own cooking and cleans her  own room     Hand Dominance        Extremity/Trunk Assessment   Upper Extremity Assessment Upper Extremity Assessment: Defer to OT evaluation RUE Deficits / Details: grossly 4+/5 LUE Deficits / Details: grossly 4-/5, decr sensation 2/2 neuropathy and CVA LUE Sensation: history of peripheral neuropathy;decreased light touch    Lower Extremity Assessment Lower Extremity Assessment: Generalized weakness       Communication      Cognition Arousal/Alertness: Awake/alert Behavior During Therapy: WFL for tasks assessed/performed Overall  Cognitive Status: Within Functional Limits for tasks assessed                                          General Comments      Exercises     Assessment/Plan    PT Assessment Patient does not need any further PT services  PT Problem List         PT Treatment Interventions      PT Goals (Current goals can be found in the Care Plan section)       Frequency       Co-evaluation               AM-PAC PT "6 Clicks" Mobility  Outcome Measure Help needed turning from your back to your side while in a flat bed without using bedrails?: None Help needed moving from lying on your back to sitting on the side of a flat bed without using bedrails?: None Help needed moving to and from a bed to a chair (including a wheelchair)?: None Help needed standing up from a chair using your arms (e.g., wheelchair or bedside chair)?: None Help needed to walk in hospital room?: None Help needed climbing 3-5 steps with a railing? : A Little 6 Click Score: 23    End of Session Equipment Utilized During Treatment: Gait belt Activity Tolerance: Patient tolerated treatment well Patient left: in chair;with call bell/phone within reach;with chair alarm set Nurse Communication: Mobility status PT Visit Diagnosis: Other abnormalities of gait and mobility (R26.89);Difficulty in walking, not elsewhere classified (R26.2)    Time: 1610-9604 PT Time Calculation (min) (ACUTE ONLY): 16 min   Charges:   PT Evaluation $PT Eval Low Complexity: 1 Low PT Treatments $Therapeutic Activity: 8-22 mins PT General Charges $$ ACUTE PT VISIT: 1 Visit         Olga Coaster PT, DPT 2:59 PM,07/20/22

## 2022-07-20 NOTE — Progress Notes (Signed)
PROGRESS NOTE    [name]  RUE:[id number] DOB: [birth year] DOA: 07/19/2022 PCP: [name], [name]   Assessment & Plan:   Principal Problem:   UTI (urinary tract infection) Active Problems:   Orthostatic hypotension   Seizure (HCC)   Essential hypertension, benign   GERD without esophagitis   Chronic diastolic CHF (congestive heart failure) (HCC)   Dyslipidemia   Lightheadedness   Hemiparesis affecting left side as late effect of cerebrovascular accident (CVA) (HCC)   Uncontrolled type 2 diabetes mellitus with hyperglycemia (HCC)   Hyperglycemia  Assessment and Plan: UTI: UA was positive. Urine cx is pending. Continue on IV ceftriaxone   Presyncope: etiology unclear, possibly secondary to above infection. Orthostatic vitals are neg. D/c hydrochlorothiazide. Troponins neg x 2    HTN: restart home dose of coreg. Holding olmesartan, lasix    DM2: likely poorly controlled. Continue on glargine, SSI w/ accuchecks    Hx of seizures: continue on home dose of keppra    Hx of CVA: w/ left sided deficits. Continue on plavix, statin    HLD: continue on statin, zetia.  Hx of CAD: holding home dose of coreg, olmesartan. Continue on statin   Hx of PAD: continue on home dose of plavix, statin    GERD: continue on PPI    Hx of asthma: unknown stage and/or severity. Bronchodilators prn    Chronic pain: continue on home dose of gabapentin, amitriptyline, cymbalta. Holding home dose of baclofen   Obesity: BMI 39.1. Complicates overall care & prognosis      DVT prophylaxis: lovenox  Code Status: full  Family Communication: Disposition Plan: likely d/c back home   Level of care: Telemetry Medical Status is: Observation The patient remains OBS appropriate and will d/c before 2 midnights.     Consultants:    Procedures:   Antimicrobials: rocephin    Subjective: Pt c/o malaise  Objective: Vitals:   07/20/22 0131 07/20/22 0533 07/20/22 0710 07/20/22 0727   BP: 120/75 135/79  (!) 153/82  Pulse: 63 77  82  Resp: 18 18  19   Temp: (!) 97.5 F (36.4 C) 98.1 F (36.7 C)  98.2 F (36.8 C)  TempSrc: Oral Oral  Oral  SpO2: 99% 97% 98% 97%  Weight: 110 kg     Height: 5\' 6"  (1.676 m)      No intake or output data in the 24 hours ending 07/20/22 0802 Filed Weights   07/20/22 0131  Weight: 110 kg    Examination:  General exam: Appears calm and comfortable  Respiratory system: Clear to auscultation. Respiratory effort normal. Cardiovascular system: S1 & S2 +. No rubs, gallops or clicks. Gastrointestinal system: Abdomen is obese, soft and nontender. Normal bowel sounds heard. Central nervous system: Alert and oriented. Moves all extremities  Psychiatry: Judgement and insight appear normal. Appropriate mood and affect     Data Reviewed: I have personally reviewed following labs and imaging studies  CBC: Recent Labs  Lab 07/19/22 1423 07/19/22 2204 07/20/22 0605  WBC 6.5 7.1 6.7  NEUTROABS  --  4.4  --   HGB 12.7 12.4 12.1  HCT 40.5 40.6 39.1  MCV 92.7 93.8 93.8  PLT 249 236 227   Basic Metabolic Panel: Recent Labs  Lab 07/19/22 1423 07/19/22 2204 07/20/22 0605  NA 137 135 139  K 4.0 4.3 3.8  CL 104 103 106  CO2 23 20* 24  GLUCOSE 328* 307* 253*  BUN 13 17 21*  CREATININE 1.12* 1.25* 0.99  CALCIUM 8.7* 8.9 8.7*   GFR: Estimated Creatinine Clearance: 78.8 mL/min (by C-G formula based on SCr of 0.99 mg/dL). Liver Function Tests: Recent Labs  Lab 07/19/22 2204 07/20/22 0605  AST 20 18  ALT 17 19  ALKPHOS 82 83  BILITOT 0.6 0.2*  PROT 6.6 6.3*  ALBUMIN 3.5 3.1*   No results for input(s): "LIPASE", "AMYLASE" in the last 168 hours. No results for input(s): "AMMONIA" in the last 168 hours. Coagulation Profile: No results for input(s): "INR", "PROTIME" in the last 168 hours. Cardiac Enzymes: No results for input(s): "CKTOTAL", "CKMB", "CKMBINDEX", "TROPONINI" in the last 168 hours. BNP (last 3 results) No  results for input(s): "PROBNP" in the last 8760 hours. HbA1C: No results for input(s): "HGBA1C" in the last 72 hours. CBG: Recent Labs  Lab 07/19/22 1511 07/19/22 2138 07/20/22 0137 07/20/22 0527 07/20/22 0727  GLUCAP 284* 334* 184* 252* 230*   Lipid Profile: No results for input(s): "CHOL", "HDL", "LDLCALC", "TRIG", "CHOLHDL", "LDLDIRECT" in the last 72 hours. Thyroid Function Tests: No results for input(s): "TSH", "T4TOTAL", "FREET4", "T3FREE", "THYROIDAB" in the last 72 hours. Anemia Panel: No results for input(s): "VITAMINB12", "FOLATE", "FERRITIN", "TIBC", "IRON", "RETICCTPCT" in the last 72 hours. Sepsis Labs: No results for input(s): "PROCALCITON", "LATICACIDVEN" in the last 168 hours.  No results found for this or any previous visit (from the past 240 hour(s)).       Radiology Studies: DG Chest Port 1 View  Result Date: 07/19/2022 CLINICAL DATA:  Weakness EXAM: PORTABLE CHEST 1 VIEW COMPARISON:  Chest x-ray July 07, 2021 FINDINGS: The cardiomediastinal silhouette is unchanged in contour. Of note, overlying hair partially obscures visualization of biapical lungs. No focal pulmonary opacity. No pleural effusion or pneumothorax. The visualized upper abdomen is unremarkable. No acute osseous abnormality. IMPRESSION: No acute cardiopulmonary abnormality. Electronically Signed   By: Jacob Moores M.D.   On: 07/19/2022 15:12        Scheduled Meds:  amitriptyline  50-75 mg Oral QHS   clopidogrel  75 mg Oral Daily   DULoxetine  60 mg Oral Daily   [START ON 07/21/2022] enoxaparin (LOVENOX) injection  55 mg Subcutaneous QHS   ezetimibe  10 mg Oral Daily   gabapentin  600 mg Oral BID   And   gabapentin  1,200 mg Oral QHS   insulin aspart  0-20 Units Subcutaneous TID WC   insulin glargine-yfgn  50 Units Subcutaneous QHS   levETIRAcetam  500 mg Oral Daily   levETIRAcetam  750 mg Oral QHS   mirabegron ER  50 mg Oral Daily   pantoprazole  40 mg Oral Daily   rosuvastatin   10 mg Oral Daily   Continuous Infusions:  cefTRIAXone (ROCEPHIN)  IV       LOS: 0 days    Time spent: 35 mins    Charise Killian, MD [facility] Pager [phone]  If 7PM-7AM, please contact night-coverage [email or web address] 07/20/2022, 8:02 AM

## 2022-07-20 NOTE — Inpatient Diabetes Management (Addendum)
Inpatient Diabetes Program Recommendations  AACE/ADA: New Consensus Statement on Inpatient Glycemic Control (2015)  Target Ranges:  Prepandial:   less than 140 mg/dL      Peak postprandial:   less than 180 mg/dL (1-2 hours)      Critically ill patients:  140 - 180 mg/dL     Latest Reference Range & Units 07/19/22 14:29 07/19/22 15:11 07/19/22 21:38 07/20/22 01:37 07/20/22 05:27 07/20/22 07:27  Glucose-Capillary 70 - 99 mg/dL 213 (H) 086 (H)  8 units Novolog 334 (H) 184 (H)  50 units Semglee @0212  252 (H) 230 (H)  (H): Data is abnormally high   Admit with: UTI/ Presyncope/ Hyperglycemia  History: DM, COPD, CVA, CKD, CHF  Home DM Meds: Trulicity 4.5 mg Qweek (NOT taking due to supply issue)        Toujeo 70 units QAM        Humalog Up to 30 units TID with meals  Current Orders: Novolog Resistant Correction Scale/ SSI (0-20 units) TID AC      Semglee 50 units at bedtime    MD- Note Semglee started at 2am.  Home dose is 70 units Daily.  Pt is also supposed to be taking Rapid-acting insulin with meals at home.  Please consider:  1. Increase Semglee to 55 at bedtime  2. Start Novolog 4 units TID with meals for meal coverage HOLD if pt NPO HOLD if pt eats <50% meals     PCP: [name], PA with [facility]  ENDO: Dr. Lucianne Muss with [facility]--Last seen 04/16/2022 A1c at that visit was 9.8% Rx for Dexcom Sent to Pharmacy Was told to take the following: Toujeo 70 units daily in a.m. Humalog: 4 to 6 units to cover the food, 4 units for small meals and 6 units for full meals Additionally take Humalog if blood sugars are more than 150 as follows 150-200: Take 4 units 200-250: Take 6 units Over 300: Take 10 units   A1c Pending   Addendum 11:15am--Met w/ pt at bedside.  Verified home medication regimen for diabetes: Trulicity 4.5 mg Qweek (on back order--last dose was 5 months ago) Toujeo 70 units Daily Humalog: 4 to 6 units to cover the food, 4 units for small meals  and 6 units for full meals Additionally take Humalog if blood sugars are more than 150 as follows 150-200: Take 4 units 200-250: Take 6 units Over 300: Take 10 units  Pt told me she recently switched all her Rxs to [facility] from [facility].  Has all insulins and CBG meter equipment at home.  Lives with daughter and sees ENDO on regular basis.  Told me Dr. Lucianne Muss with [facility] is retiring in August 2024 and she plans to switch to one of the other providers in the office at [facility].  We talked about her Last A1c of 9.8% in March--pt told me this A1c is actually the best A1c she has ever had--Last A1c per pt report was 13-14%.  Pt told me when her CBGs are in the 150 range, she is "totally out of it and can't function"--stated to me a good AM CBG is 180-200 for her.  Stated to me her CBGs have been 300-500 at home the last few weeks and she expects the A1c we have drawn in the hospital to be elevated higher than 9%.  Told me she has all her insulin and Accu Check CBG meter supplies at home.  Pt also stated to me that the Dexcom CGM "don't work for me"  and the "Omnipod insulin pump don't work for me".  Prefers her Accu check CBG meter system.  Discussed with pt she likely "feels out of it" when her CBGs are 150 or less b/c her body is used to her CBGs running higher--Discussed with pt that lowering her CBGs and keeping them around 150 would be healthier and she would likely adjust to these new normal CBG ranges.  We talked about the importance of good CBG control to prevent further organ damage.  I encouraged pt to continue to take her meds as prescribed and to follow up with ENDO on regular basis.  Pt appreciative of visit and did not  have any further questions for me at this time.   --Will follow patient during hospitalization--  Ambrose Finland RN, MSN, CDCES Diabetes Coordinator Inpatient Glycemic Control Team Team Pager: [phone] (8a-5p)

## 2022-07-20 NOTE — Evaluation (Signed)
Occupational Therapy Evaluation Patient Details Name: [name] MRN: [id number] DOB: January 09, 1966 Today's Date: 07/20/2022   History of Present Illness 57 y.o. female arrived via EMS from home for generalized weakness, dizziness, and hyperglycemia found to be hypotensive by EMS. Admitted for UTI. PMH: COPD, DM, HTN, GAD, recent UTI, OSA on CPAP, CVA (L side deficits), CKD, diastolic CHF, HLD, seizures, L eye blindness.   Clinical Impression   Pt was seen for OT evaluation this date. Prior to hospital admission, pt was using a rollator for mobility, using shower chair, and denies falls at baseline. She lives with her daughter in a [facility], [address]. Pt presents to acute OT demonstrating impaired ADL performance and functional mobility 2/2 decreased strength, hx weakness/sensation (See OT problem list for additional functional deficits). Pt currently requires CGA-MIN A for ADL transfers and MOD A for LB ADL tasks. Pt instructed in home/routines modifications, AE/DME, and falls prevention to optimize safety. Pt would benefit from skilled OT services to address noted impairments and functional limitations (see below for any additional details) in order to maximize safety and independence while minimizing falls risk and caregiver burden.     Recommendations for follow up therapy are one component of a multi-disciplinary discharge planning process, led by the attending physician.  Recommendations may be updated based on patient status, additional functional criteria and insurance authorization.   Assistance Recommended at Discharge Intermittent Supervision/Assistance  Patient can return home with the following A little help with walking and/or transfers;A little help with bathing/dressing/bathroom;Assist for transportation;Assistance with cooking/housework;Help with stairs or ramp for entrance    Functional Status Assessment  Patient has had a recent decline in their functional status and demonstrates  the ability to make significant improvements in function in a reasonable and predictable amount of time.  Equipment Recommendations  None recommended by OT    Recommendations for Other Services       Precautions / Restrictions Precautions Precautions: Fall Restrictions Weight Bearing Restrictions: No      Mobility Bed Mobility Overal bed mobility: Needs Assistance Bed Mobility: Supine to Sit     Supine to sit: Supervision          Transfers Overall transfer level: Needs assistance Equipment used: Rolling walker (2 wheels) Transfers: Sit to/from Stand Sit to Stand: Min guard, Min assist           General transfer comment: VC for hand placement and pt needed some momentum to rise      Balance Overall balance assessment: Needs assistance Sitting-balance support: Feet supported, Single extremity supported, No upper extremity supported Sitting balance-Leahy Scale: Fair     Standing balance support: Bilateral upper extremity supported, During functional activity, Reliant on assistive device for balance Standing balance-Leahy Scale: Fair                             ADL either performed or assessed with clinical judgement   ADL                                         General ADL Comments: Pt currently requires MIN A for LB ADL tasks, CGA-MIN A for ADL transfers,     Vision         Perception     Praxis      Pertinent Vitals/Pain Pain Assessment Pain Assessment: 0-10 Pain Score:  8  Pain Location: chronic BLE Pain Descriptors / Indicators: Aching Pain Intervention(s): Limited activity within patient's tolerance, Monitored during session, Repositioned     Hand Dominance     Extremity/Trunk Assessment Upper Extremity Assessment Upper Extremity Assessment: RUE deficits/detail;LUE deficits/detail RUE Deficits / Details: grossly 4+/5 LUE Deficits / Details: grossly 4-/5, decr sensation 2/2 neuropathy and CVA LUE Sensation:  history of peripheral neuropathy;decreased light touch   Lower Extremity Assessment Lower Extremity Assessment: Defer to PT evaluation       Communication     Cognition Arousal/Alertness: Awake/alert Behavior During Therapy: WFL for tasks assessed/performed Overall Cognitive Status: Within Functional Limits for tasks assessed                                       General Comments       Exercises Other Exercises Other Exercises: Pt educated in AE for LB dressing, falls prevention, and home/routines for ADL   Shoulder Instructions      Home Living                                          Prior Functioning/Environment                          OT Problem List: Decreased strength;Decreased activity tolerance;Impaired balance (sitting and/or standing);Decreased knowledge of use of DME or AE;Pain      OT Treatment/Interventions: Self-care/ADL training;Therapeutic exercise;Therapeutic activities;DME and/or AE instruction;Patient/family education;Balance training    OT Goals(Current goals can be found in the care plan section) Acute Rehab OT Goals Patient Stated Goal: get better and go to daughter's home OT Goal Formulation: With patient Time For Goal Achievement: 08/03/22 Potential to Achieve Goals: Good ADL Goals Pt Will Perform Lower Body Dressing: with modified independence;sit to/from stand Pt Will Transfer to Toilet: with modified independence;ambulating Additional ADL Goal #1: Pt will verbalize plan to implement at least 2 learned falls prevention strategies to maximize safety.  OT Frequency: Min 2X/week    Co-evaluation              AM-PAC OT "6 Clicks" Daily Activity     Outcome Measure Help from another person eating meals?: None Help from another person taking care of personal grooming?: None Help from another person toileting, which includes using toliet, bedpan, or urinal?: A Little Help from another person  bathing (including washing, rinsing, drying)?: A Little Help from another person to put on and taking off regular upper body clothing?: None Help from another person to put on and taking off regular lower body clothing?: A Little 6 Click Score: 21   End of Session Equipment Utilized During Treatment: Gait belt;Rolling walker (2 wheels) Nurse Communication: Mobility status  Activity Tolerance: Patient tolerated treatment well Patient left: in chair;with call bell/phone within reach;with chair alarm set  OT Visit Diagnosis: Other abnormalities of gait and mobility (R26.89);Muscle weakness (generalized) (M62.81)                Time: 1610-9604 OT Time Calculation (min): 43 min Charges:  OT General Charges $OT Visit: 1 Visit OT Evaluation $OT Eval Low Complexity: 1 Low OT Treatments $Self Care/Home Management : 23-37 mins  Arman Filter., MPH, MS, OTR/L ascom [phone] 07/20/22, 1:25 PM

## 2022-07-20 NOTE — Progress Notes (Signed)
PHARMACIST - PHYSICIAN COMMUNICATION  CONCERNING:  Enoxaparin (Lovenox) for DVT Prophylaxis    RECOMMENDATION: Patient was prescribed enoxaprin 40mg  q24 hours for VTE prophylaxis.   There were no vitals filed for this visit.  There is no height or weight on file to calculate BMI.  Estimated Creatinine Clearance: 62.1 mL/min (A) (by C-G formula based on SCr of 1.25 mg/dL (H)).   Based on [facility] policy patient is candidate for enoxaparin 0.5mg /kg TBW SQ every 24 hours based on BMI being >30.  DESCRIPTION: Pharmacy has adjusted enoxaparin dose per [facility] policy.  Patient is now receiving enoxaparin 0.5 mg/kg every 24 hours   Otelia Sergeant, PharmD, [facility] 07/20/2022 1:00 AM

## 2022-07-21 ENCOUNTER — Telehealth: Payer: Self-pay

## 2022-07-21 ENCOUNTER — Ambulatory Visit: Payer: 59 | Admitting: Endocrinology

## 2022-07-21 DIAGNOSIS — J45909 Unspecified asthma, uncomplicated: Secondary | ICD-10-CM | POA: Diagnosis not present

## 2022-07-21 DIAGNOSIS — E1165 Type 2 diabetes mellitus with hyperglycemia: Secondary | ICD-10-CM

## 2022-07-21 DIAGNOSIS — Z79899 Other long term (current) drug therapy: Secondary | ICD-10-CM | POA: Diagnosis not present

## 2022-07-21 DIAGNOSIS — N3000 Acute cystitis without hematuria: Secondary | ICD-10-CM | POA: Diagnosis not present

## 2022-07-21 DIAGNOSIS — J449 Chronic obstructive pulmonary disease, unspecified: Secondary | ICD-10-CM | POA: Diagnosis not present

## 2022-07-21 DIAGNOSIS — N189 Chronic kidney disease, unspecified: Secondary | ICD-10-CM | POA: Diagnosis not present

## 2022-07-21 DIAGNOSIS — I130 Hypertensive heart and chronic kidney disease with heart failure and stage 1 through stage 4 chronic kidney disease, or unspecified chronic kidney disease: Secondary | ICD-10-CM | POA: Diagnosis not present

## 2022-07-21 DIAGNOSIS — E1169 Type 2 diabetes mellitus with other specified complication: Secondary | ICD-10-CM | POA: Diagnosis not present

## 2022-07-21 DIAGNOSIS — I10 Essential (primary) hypertension: Secondary | ICD-10-CM | POA: Diagnosis not present

## 2022-07-21 DIAGNOSIS — I951 Orthostatic hypotension: Secondary | ICD-10-CM | POA: Diagnosis not present

## 2022-07-21 DIAGNOSIS — Z87891 Personal history of nicotine dependence: Secondary | ICD-10-CM | POA: Diagnosis not present

## 2022-07-21 DIAGNOSIS — E860 Dehydration: Secondary | ICD-10-CM | POA: Diagnosis not present

## 2022-07-21 DIAGNOSIS — E1122 Type 2 diabetes mellitus with diabetic chronic kidney disease: Secondary | ICD-10-CM | POA: Diagnosis not present

## 2022-07-21 DIAGNOSIS — Z794 Long term (current) use of insulin: Secondary | ICD-10-CM | POA: Diagnosis not present

## 2022-07-21 DIAGNOSIS — I5032 Chronic diastolic (congestive) heart failure: Secondary | ICD-10-CM | POA: Diagnosis not present

## 2022-07-21 DIAGNOSIS — I69354 Hemiplegia and hemiparesis following cerebral infarction affecting left non-dominant side: Secondary | ICD-10-CM | POA: Diagnosis not present

## 2022-07-21 LAB — CBC
HCT: 40.1 % (ref 36.0–46.0)
Hemoglobin: 12.5 g/dL (ref 12.0–15.0)
MCH: 29.1 pg (ref 26.0–34.0)
MCHC: 31.2 g/dL (ref 30.0–36.0)
MCV: 93.3 fL (ref 80.0–100.0)
Platelets: 247 K/uL (ref 150–400)
RBC: 4.3 MIL/uL (ref 3.87–5.11)
RDW: 13.7 % (ref 11.5–15.5)
WBC: 5.7 K/uL (ref 4.0–10.5)
nRBC: 0 % (ref 0.0–0.2)

## 2022-07-21 LAB — PROCALCITONIN: Procalcitonin: 0.15 ng/mL

## 2022-07-21 LAB — BASIC METABOLIC PANEL WITH GFR
Anion gap: 9 (ref 5–15)
BUN: 20 mg/dL (ref 6–20)
CO2: 27 mmol/L (ref 22–32)
Calcium: 8.5 mg/dL — ABNORMAL LOW (ref 8.9–10.3)
Chloride: 102 mmol/L (ref 98–111)
Creatinine, Ser: 0.94 mg/dL (ref 0.44–1.00)
GFR, Estimated: >60 mL/min (ref >60)
Glucose, Bld: 218 mg/dL — ABNORMAL HIGH (ref 70–99)
Potassium: 4.1 mmol/L (ref 3.5–5.1)
Sodium: 138 mmol/L (ref 135–145)

## 2022-07-21 LAB — HEMOGLOBIN A1C
Hgb A1c MFr Bld: 9.7 % — ABNORMAL HIGH (ref 4.8–5.6)
Mean Plasma Glucose: 232 mg/dL

## 2022-07-21 LAB — GLUCOSE, CAPILLARY
Glucose-Capillary: 131 mg/dL — ABNORMAL HIGH (ref 70–99)
Glucose-Capillary: 197 mg/dL — ABNORMAL HIGH (ref 70–99)
Glucose-Capillary: 249 mg/dL — ABNORMAL HIGH (ref 70–99)
Glucose-Capillary: 255 mg/dL — ABNORMAL HIGH (ref 70–99)

## 2022-07-21 LAB — URINE CULTURE: Culture: >=100000 — AB

## 2022-07-21 MED ORDER — INSULIN LISPRO (1 UNIT DIAL) 100 UNIT/ML (KWIKPEN)
PEN_INJECTOR | SUBCUTANEOUS | 2 refills | Status: DC
Start: 2022-07-21 — End: 2022-08-11

## 2022-07-21 MED ORDER — TOUJEO MAX SOLOSTAR 300 UNIT/ML ~~LOC~~ SOPN
PEN_INJECTOR | SUBCUTANEOUS | 3 refills | Status: DC
Start: 2022-07-21 — End: 2022-08-11

## 2022-07-21 NOTE — Telephone Encounter (Signed)
[name], CMA  ?

## 2022-07-21 NOTE — Progress Notes (Signed)
PROGRESS NOTE   HPI was taken from Dr. Rachael Darby:  [name] is a 57 y.o. female with medical history significant of Past medical history type 2 diabetes, COPD, prior CVA (left sided deficits), CKD, hypertension, diastolic CHF, hyperlipidemia, seizures,  aortic root dilation, left eye blindness who arrived via EMS from home for generalized weakness and hyperglycemia found to be hypotensive by EMS.     EMS gave her 500 cc bolus for initial BP of 79/49 and a CBG of 367.  She has been having dysuria and foul-smelling odor from her urine for a while now.  She was seen in the emergency department earlier today and diagnosed with a urinary tract infection after receiving ceftriaxone and fluids patient went home.  After to returning home, she just did not feel right.  She was walking to kitchen to get her dinner and her granddaughter told her that she looked like she could not walk.  States that she almost fell.  Her blood sugar was greater than 300 at that time.  Endorses lightheadedness and reports if her blood sugar drops lower than 150.  Her last A1c was greater than 10.     BP as low as 81/52 and was at once tachypneic at previous ED visit however now her vital signs are normal including her blood pressure at 118/63.  CBC normal.  CMP with serum creatinine 1.25, glucose 307, bicarb 20 which is similar to her labs earlier today.  BHB 0.12.  pH 7.4.  Urinalysis with glucosuria and evidence of acute cystitis.  Urine culture obtained.  EKG showing sinus rhythm.  Chest x-ray unremarkable.  ED provider consulted the hospitalist team for admission evaluation.   [name]  ZOX:[id number] DOB: October 31, 1965 DOA: 07/19/2022 PCP: [name], [name]   Assessment & Plan:   Principal Problem:   UTI (urinary tract infection) Active Problems:   Orthostatic hypotension   Seizure (HCC)   Essential hypertension, benign   GERD without esophagitis   Chronic diastolic CHF (congestive heart failure)  (HCC)   Dyslipidemia   Lightheadedness   Hemiparesis affecting left side as late effect of cerebrovascular accident (CVA) (HCC)   Uncontrolled type 2 diabetes mellitus with hyperglycemia (HCC)   Hyperglycemia  Assessment and Plan: UTI: UA was positive. Urine cx is growing gram neg rods, sens pending. Continue on IV rocephin    Presyncope: etiology unclear, possibly secondary to above infection. Orthostatic vitals are neg. D/c hydrochlorothiazide. Troponins neg x 2    HTN: continue on home dose of coreg. Holding olmesartan, lasix    DM2: poorly controlled, HbA1c 9.7. Continue on glargine, SSI w/ accuchecks    Hx of seizures: continue on home dose of keppra    Hx of CVA: w/ left sided deficits. Continue on statin, plavix    HLD: continue on zetia, statin   Hx of CAD: continue on coreg, statin. Holding olmesartan    Hx of PAD: continue on statin, plavix    GERD: continue on PPI    Hx of asthma: unknown stage and/or severity. Bronchodilators prn    Chronic pain: continue on home dose of duloxetine, amitriptyline, gabapentin. Holding home dose of baclofen   Obesity: BMI 39.1.Complicates overall care & prognosis   OSA: CPAP qhs   DVT prophylaxis: lovenox  Code Status: full  Family Communication: discussed pt's care w/ pt's daughter, Aundria Rud, and answered her questions  Disposition Plan: likely d/c back home   Level of care: Telemetry Medical Status is: Observation The patient remains  OBS appropriate and will d/c before 2 midnights. Requiring IV abxs and waiting on finalized urine cx      Consultants:    Procedures:   Antimicrobials: rocephin    Subjective: Pt c/o malaise   Objective: Vitals:   07/20/22 1609 07/20/22 2024 07/21/22 0016 07/21/22 0508  BP: (!) 147/91 130/77 115/74 (!) 143/82  Pulse: 71 70 76 73  Resp: 18 18 18 17   Temp: 98 F (36.7 C) 98.2 F (36.8 C) 98 F (36.7 C)   TempSrc:  Oral Oral   SpO2: 98% 100% 95% 93%  Weight:      Height:         Intake/Output Summary (Last 24 hours) at 07/21/2022 0747 Last data filed at 07/20/2022 1907 Gross per 24 hour  Intake 1240 ml  Output --  Net 1240 ml   Filed Weights   07/20/22 0131  Weight: 110 kg    Examination:  General exam: Appears calm & comfortable  Respiratory system: clear breath sounds  Cardiovascular system: S1/S2+. No rubs or clicks  Gastrointestinal system: abd is soft, NT, obese & hypoactive bowel sounds  Central nervous system: alert & oriented. Moves all extremities  Psychiatry: judgement and insight appears normal. Appropriate mood and affect     Data Reviewed: I have personally reviewed following labs and imaging studies  CBC: Recent Labs  Lab 07/19/22 1423 07/19/22 2204 07/20/22 0605 07/21/22 0349  WBC 6.5 7.1 6.7 5.7  NEUTROABS  --  4.4  --   --   HGB 12.7 12.4 12.1 12.5  HCT 40.5 40.6 39.1 40.1  MCV 92.7 93.8 93.8 93.3  PLT 249 236 227 247   Basic Metabolic Panel: Recent Labs  Lab 07/19/22 1423 07/19/22 2204 07/20/22 0605 07/21/22 0349  NA 137 135 139 138  K 4.0 4.3 3.8 4.1  CL 104 103 106 102  CO2 23 20* 24 27  GLUCOSE 328* 307* 253* 218*  BUN 13 17 21* 20  CREATININE 1.12* 1.25* 0.99 0.94  CALCIUM 8.7* 8.9 8.7* 8.5*   GFR: Estimated Creatinine Clearance: 83 mL/min (by C-G formula based on SCr of 0.94 mg/dL). Liver Function Tests: Recent Labs  Lab 07/19/22 2204 07/20/22 0605  AST 20 18  ALT 17 19  ALKPHOS 82 83  BILITOT 0.6 0.2*  PROT 6.6 6.3*  ALBUMIN 3.5 3.1*   No results for input(s): "LIPASE", "AMYLASE" in the last 168 hours. No results for input(s): "AMMONIA" in the last 168 hours. Coagulation Profile: No results for input(s): "INR", "PROTIME" in the last 168 hours. Cardiac Enzymes: No results for input(s): "CKTOTAL", "CKMB", "CKMBINDEX", "TROPONINI" in the last 168 hours. BNP (last 3 results) No results for input(s): "PROBNP" in the last 8760 hours. HbA1C: Recent Labs    07/20/22 0605  HGBA1C 9.7*    CBG: Recent Labs  Lab 07/20/22 0527 07/20/22 0727 07/20/22 1215 07/20/22 1611 07/20/22 2023  GLUCAP 252* 230* 200* 177* 172*   Lipid Profile: No results for input(s): "CHOL", "HDL", "LDLCALC", "TRIG", "CHOLHDL", "LDLDIRECT" in the last 72 hours. Thyroid Function Tests: No results for input(s): "TSH", "T4TOTAL", "FREET4", "T3FREE", "THYROIDAB" in the last 72 hours. Anemia Panel: No results for input(s): "VITAMINB12", "FOLATE", "FERRITIN", "TIBC", "IRON", "RETICCTPCT" in the last 72 hours. Sepsis Labs: Recent Labs  Lab 07/21/22 0349  PROCALCITON 0.15    No results found for this or any previous visit (from the past 240 hour(s)).       Radiology Studies: DG Chest Port 1 View  Result Date:  07/19/2022 CLINICAL DATA:  Weakness EXAM: PORTABLE CHEST 1 VIEW COMPARISON:  Chest x-ray July 07, 2021 FINDINGS: The cardiomediastinal silhouette is unchanged in contour. Of note, overlying hair partially obscures visualization of biapical lungs. No focal pulmonary opacity. No pleural effusion or pneumothorax. The visualized upper abdomen is unremarkable. No acute osseous abnormality. IMPRESSION: No acute cardiopulmonary abnormality. Electronically Signed   By: Jacob Moores M.D.   On: 07/19/2022 15:12        Scheduled Meds:  amitriptyline  75 mg Oral QHS   carvedilol  12.5 mg Oral BID WC   clopidogrel  75 mg Oral Daily   docusate sodium  200 mg Oral BID   DULoxetine  60 mg Oral Daily   enoxaparin (LOVENOX) injection  55 mg Subcutaneous QHS   ezetimibe  10 mg Oral Daily   gabapentin  600 mg Oral BID   And   gabapentin  1,200 mg Oral QHS   insulin aspart  0-20 Units Subcutaneous TID WC   insulin glargine-yfgn  50 Units Subcutaneous QHS   levETIRAcetam  500 mg Oral Daily   levETIRAcetam  750 mg Oral QHS   mirabegron ER  50 mg Oral Daily   pantoprazole  40 mg Oral Daily   polyethylene glycol  17 g Oral Daily   rosuvastatin  10 mg Oral Daily   Continuous Infusions:   cefTRIAXone (ROCEPHIN)  IV 1 g (07/20/22 1656)     LOS: 0 days    Time spent: 35 mins    Charise Killian, MD [facility] Pager [phone]  If 7PM-7AM, please contact night-coverage [email or web address] 07/21/2022, 7:47 AM

## 2022-07-21 NOTE — Progress Notes (Signed)
OT Cancellation Note  Patient Details Name: [name] MRN: [id number] DOB: 06/29/1965   Cancelled Treatment:    Reason Eval/Treat Not Completed: Fatigue/lethargy limiting ability to participate;Patient declined, no reason specified. Pt politely declining 2/2 fatigue and citing her blood sugars as the reason for the fatigue. Will re-attempt at later date/time as able.   Arman Filter., MPH, MS, OTR/L ascom [phone] 07/21/22, 3:46 PM

## 2022-07-21 NOTE — TOC CM/SW Note (Signed)
Transition of Care [facility]) - Inpatient Brief Assessment   Patient Details  Name: [name] MRN: [id number] Date of Birth: 1965/05/04  Transition of Care [facility]) CM/SW Contact:    Garret Reddish, RN Phone Number: 07/21/2022, 10:30 AM   Clinical Narrative:  Chart reviewed.  Noted that patient was admitted for UTI.  Patient is currently on IV Ceftriaxone.  Urine culture currently pending.    Noted no PT follow up needed on discharge. No TOC needs noted at this time.  TOC will continue to follow for discharge needs.    Transition of Care Asessment: Insurance and Status: Insurance coverage has been reviewed Patient has primary care physician: Yes Home environment has been reviewed: Reviwed Prior level of function:: Able to ambulate with rollator Prior/Current Home Services: No current home services Social Determinants of Health Reivew: SDOH reviewed no interventions necessary Readmission risk has been reviewed: Yes Transition of care needs: no transition of care needs at this time

## 2022-07-22 ENCOUNTER — Encounter: Payer: Self-pay | Admitting: Internal Medicine

## 2022-07-22 DIAGNOSIS — I5032 Chronic diastolic (congestive) heart failure: Secondary | ICD-10-CM | POA: Diagnosis not present

## 2022-07-22 DIAGNOSIS — I951 Orthostatic hypotension: Secondary | ICD-10-CM | POA: Diagnosis not present

## 2022-07-22 DIAGNOSIS — N3000 Acute cystitis without hematuria: Secondary | ICD-10-CM | POA: Diagnosis not present

## 2022-07-22 DIAGNOSIS — I69354 Hemiplegia and hemiparesis following cerebral infarction affecting left non-dominant side: Secondary | ICD-10-CM | POA: Diagnosis not present

## 2022-07-22 DIAGNOSIS — Z79899 Other long term (current) drug therapy: Secondary | ICD-10-CM | POA: Diagnosis not present

## 2022-07-22 DIAGNOSIS — J45909 Unspecified asthma, uncomplicated: Secondary | ICD-10-CM | POA: Diagnosis not present

## 2022-07-22 DIAGNOSIS — E860 Dehydration: Secondary | ICD-10-CM | POA: Diagnosis not present

## 2022-07-22 DIAGNOSIS — J449 Chronic obstructive pulmonary disease, unspecified: Secondary | ICD-10-CM | POA: Diagnosis not present

## 2022-07-22 DIAGNOSIS — I130 Hypertensive heart and chronic kidney disease with heart failure and stage 1 through stage 4 chronic kidney disease, or unspecified chronic kidney disease: Secondary | ICD-10-CM | POA: Diagnosis not present

## 2022-07-22 DIAGNOSIS — E1165 Type 2 diabetes mellitus with hyperglycemia: Secondary | ICD-10-CM | POA: Diagnosis not present

## 2022-07-22 DIAGNOSIS — Z87891 Personal history of nicotine dependence: Secondary | ICD-10-CM | POA: Diagnosis not present

## 2022-07-22 DIAGNOSIS — E1122 Type 2 diabetes mellitus with diabetic chronic kidney disease: Secondary | ICD-10-CM | POA: Diagnosis not present

## 2022-07-22 DIAGNOSIS — Z794 Long term (current) use of insulin: Secondary | ICD-10-CM | POA: Diagnosis not present

## 2022-07-22 DIAGNOSIS — N189 Chronic kidney disease, unspecified: Secondary | ICD-10-CM | POA: Diagnosis not present

## 2022-07-22 LAB — CBC
HCT: 39.6 % (ref 36.0–46.0)
Hemoglobin: 12.2 g/dL (ref 12.0–15.0)
MCH: 28.8 pg (ref 26.0–34.0)
MCHC: 30.8 g/dL (ref 30.0–36.0)
MCV: 93.4 fL (ref 80.0–100.0)
Platelets: 227 10*3/uL (ref 150–400)
RBC: 4.24 MIL/uL (ref 3.87–5.11)
RDW: 13.6 % (ref 11.5–15.5)
WBC: 5.7 10*3/uL (ref 4.0–10.5)
nRBC: 0 % (ref 0.0–0.2)

## 2022-07-22 LAB — URINE CULTURE

## 2022-07-22 LAB — BASIC METABOLIC PANEL
Anion gap: 6 (ref 5–15)
BUN: 20 mg/dL (ref 6–20)
CO2: 25 mmol/L (ref 22–32)
Calcium: 8.5 mg/dL — ABNORMAL LOW (ref 8.9–10.3)
Chloride: 105 mmol/L (ref 98–111)
Creatinine, Ser: 0.85 mg/dL (ref 0.44–1.00)
GFR, Estimated: >60 mL/min (ref >60)
Glucose, Bld: 246 mg/dL — ABNORMAL HIGH (ref 70–99)
Potassium: 4.3 mmol/L (ref 3.5–5.1)
Sodium: 136 mmol/L (ref 135–145)

## 2022-07-22 LAB — GLUCOSE, CAPILLARY
Glucose-Capillary: 225 mg/dL — ABNORMAL HIGH (ref 70–99)
Glucose-Capillary: 177 mg/dL — ABNORMAL HIGH (ref 70–99)
Glucose-Capillary: 205 mg/dL — ABNORMAL HIGH (ref 70–99)

## 2022-07-22 MED ORDER — INSULIN ASPART 100 UNIT/ML IJ SOLN
0.0000 [IU] | Freq: Three times a day (TID) | INTRAMUSCULAR | Status: DC
  Administered 2022-07-22: 3 [IU] via SUBCUTANEOUS
  Administered 2022-07-22: 5 [IU] via SUBCUTANEOUS
  Filled 2022-07-22 (×2): qty 1

## 2022-07-22 MED ORDER — INSULIN ASPART 100 UNIT/ML IJ SOLN: 0.0000 [IU] | Freq: Three times a day (TID) | INTRAMUSCULAR | Status: DC

## 2022-07-22 MED ORDER — CEPHALEXIN 500 MG PO CAPS: 500.0000 mg | ORAL_CAPSULE | Freq: Three times a day (TID) | ORAL | 0 refills | Status: AC

## 2022-07-22 MED ORDER — AMLODIPINE BESYLATE 10 MG PO TABS
10.0000 mg | ORAL_TABLET | Freq: Every day | ORAL | Status: DC
  Administered 2022-07-22: 10 mg via ORAL
  Filled 2022-07-22: qty 1

## 2022-07-22 MED ORDER — INSULIN ASPART 100 UNIT/ML IJ SOLN
3.0000 [IU] | Freq: Three times a day (TID) | INTRAMUSCULAR | Status: DC
  Administered 2022-07-22: 3 [IU] via SUBCUTANEOUS
  Filled 2022-07-22: qty 1

## 2022-07-22 MED ORDER — BISACODYL 10 MG RE SUPP
10.0000 mg | Freq: Once | RECTAL | Status: AC
  Administered 2022-07-22: 10 mg via RECTAL
  Filled 2022-07-22: qty 1

## 2022-07-22 MED ORDER — INSULIN GLARGINE-YFGN 100 UNIT/ML ~~LOC~~ SOLN
55.0000 [IU] | Freq: Every day | SUBCUTANEOUS | Status: DC
  Filled 2022-07-22: qty 0.55

## 2022-07-22 MED ORDER — SORBITOL 70 % SOLN
960.0000 mL | TOPICAL_OIL | Freq: Once | ORAL | Status: AC
  Administered 2022-07-22: 960 mL via RECTAL
  Filled 2022-07-22: qty 240

## 2022-07-22 MED ORDER — IRBESARTAN 150 MG PO TABS
300.0000 mg | ORAL_TABLET | Freq: Every day | ORAL | Status: DC
  Administered 2022-07-22: 300 mg via ORAL
  Filled 2022-07-22: qty 2

## 2022-07-22 MED ORDER — CEPHALEXIN 500 MG PO CAPS
500.0000 mg | ORAL_CAPSULE | Freq: Three times a day (TID) | ORAL | Status: DC
  Administered 2022-07-22: 500 mg via ORAL
  Filled 2022-07-22: qty 1

## 2022-07-22 NOTE — Inpatient Diabetes Management (Signed)
Inpatient Diabetes Program Recommendations  AACE/ADA: New Consensus Statement on Inpatient Glycemic Control (2015)  Target Ranges:  Prepandial:   less than 140 mg/dL      Peak postprandial:   less than 180 mg/dL (1-2 hours)      Critically ill patients:  140 - 180 mg/dL   Lab Results  Component Value Date   GLUCAP 225 (H) 07/22/2022   HGBA1C 9.7 (H) 07/20/2022    Review of Glycemic Control  Latest Reference Range & Units 07/21/22 07:56 07/21/22 11:39 07/21/22 16:00 07/21/22 19:46 07/22/22 07:31  Glucose-Capillary 70 - 99 mg/dL 191 (H) 478 (H) 295 (H) 255 (H) 225 (H)  (H): Data is abnormally high  Diabetes history: DM2 Outpatient Diabetes medications: Trulicity 4.5 mg weekly (not taking), Toujeo 70 units QAM, Humalog up to 30 units TID Current orders for Inpatient glycemic control: Semglee 50 units QHS, Novolog 0-20 units TID  Inpatient Diabetes Program Recommendations:    -Semglee 55 units at bedtime -Novolog 3 units TID with meals if she consumes at least 50% -Decrease Novolog correction to 0-15 units TID  Will continue to follow while inpatient.  Thank you, Dulce Sellar, MSN, CDCES Diabetes Coordinator Inpatient Diabetes Program [phone] (team pager from 8a-5p)

## 2022-07-22 NOTE — Progress Notes (Signed)
Patient being discharged home. All belongings sent with patient. Discharge education and teaching provided. IV removed.

## 2022-07-22 NOTE — Discharge Summary (Signed)
Physician Discharge Summary   Patient: [name] MRN: [id number] DOB: 22-Mar-1965  Admit date:     07/19/2022  Discharge date: 07/22/22  Discharge Physician: Lurene Shadow   PCP: [name], [name]   Recommendations at discharge:   Follow-up with PCP in 1 to 2 weeks  Discharge Diagnoses: Principal Problem:   UTI (urinary tract infection) Active Problems:   Orthostatic hypotension   Seizure (HCC)   Essential hypertension, benign   GERD without esophagitis   Chronic diastolic CHF (congestive heart failure) (HCC)   Dyslipidemia   Lightheadedness   Hemiparesis affecting left side as late effect of cerebrovascular accident (CVA) (HCC)   Uncontrolled type 2 diabetes mellitus with hyperglycemia (HCC)   Hyperglycemia  Resolved Problems:   * No resolved hospital problems. [facility] Hospital Course:  [name] is a 57 y.o. female with medical history significant of Past medical history type 2 diabetes, COPD, prior CVA (left sided deficits), CKD, hypertension, diastolic CHF, hyperlipidemia, seizures,  aortic root dilation, left eye blindness, history of UTIs, who presented to the hospital because of general weakness, low blood pressure and hyperglycemia.  She has a dysuria and foul-smelling urine.  Reportedly, initial blood pressure was 79/49 when EMS picked her up.  She was given fluid bolus of 500 mL prior to coming to the ED.   Patient requested a prescription be sent to [facility] mail service because they deliver "the next day"  Assessment and Plan:  Acute E. coli UTI: Urine culture showed E. coli.  She will be discharged on Keflex for 4 days to complete 7-day course of treatment.   Presyncope: etiology unclear, possibly secondary to above infection. Orthostatic vitals are neg. Troponins neg x 2    Hypertension: Resume home antihypertensives at discharge   Type II DM with hyperglycemia: poorly controlled, HbA1c 9.7.  Continue Toujeo and Trulicity at discharge.      Hx of seizures: continue on home dose of keppra      Constipation: She was given Dulcolax suppository and smog enema and she was able to move her bowels prior to discharge    Other comorbidities include obesity, OSA on CPAP, asthma, chronic pain, GERD, hyperlipidemia, CAD, PAD, history of stroke, hyperlipidemia      Her condition has improved and she is deemed stable for discharge today. She was concerned that oral antibiotics had not worked in the past and was wondering whether she needed to use IV antibiotics for 7 days.  I explained that previous antibiotics probably did not work in the past because they might not have been effective against isolated bacteria based on culture sensitivity report.  I showed her a urine culture report from 11/27/2010 at [facility] health care which showed E. coli that was resistant to ampicillin and Bactrim.       Consultants: None Procedures performed: None  Disposition: Home Diet recommendation:  Discharge Diet Orders (From admission, onward)     Start     Ordered   07/22/22 0000  Diet - low sodium heart healthy        07/22/22 1749           Cardiac and Carb modified diet DISCHARGE MEDICATION: Allergies as of 07/22/2022       Reactions   Codeine Swelling, Other (See Comments)   Swelling and burning of mouth (inside)   [place] [dapagliflozin] Other (See Comments)   Recurrent UTI with farxiga   Lactose Intolerance (gi) Nausea And Vomiting  Medication List     STOP taking these medications    baclofen 10 MG tablet Commonly known as: LIORESAL   bisacodyl 5 MG EC tablet Commonly known as: DULCOLAX   ciprofloxacin 500 MG tablet Commonly known as: Cipro   sodium phosphate 7-19 GM/118ML Enem       TAKE these medications    albuterol 108 (90 Base) MCG/ACT inhaler Commonly known as: VENTOLIN HFA Inhale 1-2 puffs into the lungs every 6 (six) hours as needed.   amitriptyline 25 MG tablet Commonly known as: ELAVIL Take  2-3 tablets (50-75 mg total) by mouth at bedtime.   amLODipine 10 MG tablet Commonly known as: NORVASC Take 10 mg by mouth daily.   BD Pen Needle Nano 2nd Gen 32G X 4 MM Misc Generic drug: Insulin Pen Needle USE WITH HUMALOG TO FILL PUMP DAILY   blood glucose meter kit and supplies Kit Dispense based on patient and insurance preference. Use up to four times daily as directed. (FOR E11.65 Z79.4).   BLOOD GLUCOSE TEST STRIPS Strp Use as directed to monitor blood sugar up to 4x a day FOR E11.65 Z79.4   butalbital-acetaminophen-caffeine 50-325-40 MG tablet Commonly known as: FIORICET Take 1-2 tablets by mouth every 6 (six) hours as needed for headache.   carvedilol 25 MG tablet Commonly known as: COREG Take 25 mg by mouth 2 (two) times daily.   cephALEXin 500 MG capsule Commonly known as: KEFLEX Take 1 capsule (500 mg total) by mouth 3 (three) times daily for 4 days.   cholecalciferol 25 MCG (1000 UNIT) tablet Commonly known as: VITAMIN D3 Take 1,000 Units by mouth daily.   cloNIDine 0.1 MG tablet Commonly known as: CATAPRES Take 0.1 mg by mouth daily as needed (For blood pressure overm160/90).   clopidogrel 75 MG tablet Commonly known as: PLAVIX Take 1 tablet (75 mg total) by mouth daily.   cycloSPORINE 0.05 % ophthalmic emulsion Commonly known as: RESTASIS Place 2 drops into both eyes 2 (two) times daily.   DULoxetine 60 MG capsule Commonly known as: Cymbalta Take 1 capsule (60 mg total) by mouth daily.   EPINEPHrine 0.3 mg/0.3 mL Soaj injection Commonly known as: EPI-PEN Inject 0.3 mg into the muscle as needed for anaphylaxis.   ezetimibe 10 MG tablet Commonly known as: ZETIA Take 1 tablet (10 mg total) by mouth daily.   furosemide 20 MG tablet Commonly known as: LASIX Take 20 mg by mouth daily.   gabapentin 600 MG tablet Commonly known as: Neurontin 600 mg qAM, 600 mg qPM, 1200 mg QHS   Gvoke HypoPen 2-Pack 1 MG/0.2ML Soaj Generic drug:  Glucagon INJECT 1 MG INTO THE SKIN ONCE AS NEEDED FOR UP TO 1 DOSE (HYPOGLYCEMIA). ONLY FOR SEVERE LOW BLOOD SUGAR NOT RESPONDING TO DRINKING JUICE/GLUCOSE TABLETS, WHEN CBG IS <70   hydrALAZINE 50 MG tablet Commonly known as: APRESOLINE Take 1 tablet by mouth 3 (three) times daily.   insulin lispro 100 UNIT/ML KwikPen Commonly known as: HumaLOG KwikPen Inject up to 30 units before each meal   Lancets Misc Use as directed to monitor blood sugar up to 4x a day FOR E11.65 Z79.4   levETIRAcetam 500 MG tablet Commonly known as: KEPPRA Take 500-750 mg by mouth See admin instructions. Take 1 tablet (500mg ) by mouth every morning and take 1 tablets by mouth (750mg ) by mouth every night   meclizine 25 MG tablet Commonly known as: ANTIVERT Take 1 tablet (25 mg total) by mouth 3 (three) times daily as needed  for dizziness.   mirabegron ER 50 MG Tb24 tablet Commonly known as: Myrbetriq Take 1 tablet (50 mg total) by mouth daily.   olmesartan 40 MG tablet Commonly known as: BENICAR Take 1 tablet (40 mg total) by mouth daily.   pantoprazole 40 MG tablet Commonly known as: PROTONIX Take 1 tablet (40 mg total) by mouth daily.   polyethylene glycol 17 g packet Commonly known as: MIRALAX / GLYCOLAX Take 17 g by mouth daily as needed for mild constipation.   rosuvastatin 10 MG tablet Commonly known as: CRESTOR Take 1 tablet (10 mg total) by mouth daily.   senna-docusate 8.6-50 MG tablet Commonly known as: Senokot-S Take 1 tablet by mouth at bedtime as needed for mild constipation.   Toujeo Max SoloStar 300 UNIT/ML Solostar Pen Generic drug: insulin glargine (2 Unit Dial) 70 units in am   Trulicity 4.5 MG/0.5ML Sopn Generic drug: Dulaglutide Inject 4.5 mg as directed once a week.   Wixela Inhub 250-50 MCG/ACT Aepb Generic drug: fluticasone-salmeterol Inhale 1 puff into the lungs 2 (two) times daily.        Discharge Exam: Filed Weights   07/20/22 0131  Weight: 110 kg    GEN: NAD SKIN: Warm and dry EYES: No pallor or icterus ENT: MMM CV: RRR PULM: CTA B ABD: soft, obese, NT, +BS CNS: AAO x 3, non focal EXT: No edema or tenderness   Condition at discharge: good  The results of significant diagnostics from this hospitalization (including imaging, microbiology, ancillary and laboratory) are listed below for reference.   Imaging Studies: DG Chest Port 1 View  Result Date: 07/19/2022 CLINICAL DATA:  Weakness EXAM: PORTABLE CHEST 1 VIEW COMPARISON:  Chest x-ray July 07, 2021 FINDINGS: The cardiomediastinal silhouette is unchanged in contour. Of note, overlying hair partially obscures visualization of biapical lungs. No focal pulmonary opacity. No pleural effusion or pneumothorax. The visualized upper abdomen is unremarkable. No acute osseous abnormality. IMPRESSION: No acute cardiopulmonary abnormality. Electronically Signed   By: Jacob Moores M.D.   On: 07/19/2022 15:12    Microbiology: Results for orders placed or performed during the hospital encounter of 07/19/22  Urine Culture     Status: Abnormal   Collection Time: 07/19/22  2:23 PM   Specimen: Urine, Clean Catch  Result Value Ref Range Status   Specimen Description   Final    URINE, CLEAN CATCH Performed at [facility], [address] [place], [place] [address]    Special Requests   Final    NONE Performed at [facility], [address] [place], [place] [address]    Culture >=100,000 COLONIES/mL ESCHERICHIA COLI (A)  Final   Report Status 07/22/2022 FINAL  Final   Organism ID, Bacteria ESCHERICHIA COLI (A)  Final      Susceptibility   Escherichia coli - MIC*    AMPICILLIN <=2 SENSITIVE Sensitive     CEFAZOLIN <=4 SENSITIVE Sensitive     CEFEPIME <=0.12 SENSITIVE Sensitive     CEFTRIAXONE <=0.25 SENSITIVE Sensitive     CIPROFLOXACIN 0.5 INTERMEDIATE Intermediate     GENTAMICIN <=1 SENSITIVE Sensitive     IMIPENEM <=0.25 SENSITIVE Sensitive      NITROFURANTOIN <=16 SENSITIVE Sensitive     TRIMETH/SULFA <=20 SENSITIVE Sensitive     AMPICILLIN/SULBACTAM <=2 SENSITIVE Sensitive     PIP/TAZO <=4 SENSITIVE Sensitive     * >=100,000 COLONIES/mL ESCHERICHIA COLI   *Note: Due to a large number of results and/or encounters for the requested time period, some  results have not been displayed. A complete set of results can be found in Results Review.    Labs: CBC: Recent Labs  Lab 07/19/22 1423 07/19/22 2204 07/20/22 0605 07/21/22 0349 07/22/22 0532  WBC 6.5 7.1 6.7 5.7 5.7  NEUTROABS  --  4.4  --   --   --   HGB 12.7 12.4 12.1 12.5 12.2  HCT 40.5 40.6 39.1 40.1 39.6  MCV 92.7 93.8 93.8 93.3 93.4  PLT 249 236 227 247 227   Basic Metabolic Panel: Recent Labs  Lab 07/19/22 1423 07/19/22 2204 07/20/22 0605 07/21/22 0349 07/22/22 0532  NA 137 135 139 138 136  K 4.0 4.3 3.8 4.1 4.3  CL 104 103 106 102 105  CO2 23 20* 24 27 25   GLUCOSE 328* 307* 253* 218* 246*  BUN 13 17 21* 20 20  CREATININE 1.12* 1.25* 0.99 0.94 0.85  CALCIUM 8.7* 8.9 8.7* 8.5* 8.5*   Liver Function Tests: Recent Labs  Lab 07/19/22 2204 07/20/22 0605  AST 20 18  ALT 17 19  ALKPHOS 82 83  BILITOT 0.6 0.2*  PROT 6.6 6.3*  ALBUMIN 3.5 3.1*   CBG: Recent Labs  Lab 07/21/22 1600 07/21/22 1946 07/22/22 0731 07/22/22 1154 07/22/22 1615  GLUCAP 249* 255* 225* 177* 205*    Discharge time spent: greater than 30 minutes.  Signed: Lurene Shadow, MD [facility] 07/22/2022

## 2022-07-22 NOTE — Progress Notes (Addendum)
Occupational Therapy Treatment Patient Details Name: [name] MRN: [id number] DOB: 09-Jan-1966 Today's Date: 07/22/2022   History of present illness 57 y.o. female arrived via EMS from home for generalized weakness, dizziness, and hyperglycemia found to be hypotensive by EMS. Admitted for UTI. PMH: COPD, DM, HTN, GAD, recent UTI, OSA on CPAP, CVA (L side deficits), CKD, diastolic CHF, HLD, seizures, L eye blindness.   OT comments  Chart reviewed, pt greeted in chair, agreeable to OT tx session. Tx session targeted task oriented training via ADL tasks in order to improve safe performance of functional tacks. Improvements noted in functional mobility, requiring supervision for amb approx 75', grooming and peri care with supervision-SET UP. Pt is making progress towards goals. OT will continue to follow acutely.    Recommendations for follow up therapy are one component of a multi-disciplinary discharge planning process, led by the attending physician.  Recommendations may be updated based on patient status, additional functional criteria and insurance authorization.    Assistance Recommended at Discharge Intermittent Supervision/Assistance  Patient can return home with the following  A little help with walking and/or transfers;A little help with bathing/dressing/bathroom;Assist for transportation;Assistance with cooking/housework;Help with stairs or ramp for entrance   Equipment Recommendations  None recommended by OT    Recommendations for Other Services      Precautions / Restrictions Precautions Precautions: Fall Restrictions Weight Bearing Restrictions: No       Mobility Bed Mobility Overal bed mobility: Needs Assistance Bed Mobility: Supine to Sit     Supine to sit: Modified independent (Device/Increase time)     General bed mobility comments: increased time    Transfers Overall transfer level: Needs assistance Equipment used: Rolling walker (2  wheels) Transfers: Sit to/from Stand Sit to Stand: Supervision                 Balance Overall balance assessment: Needs assistance Sitting-balance support: Feet supported, Single extremity supported, No upper extremity supported Sitting balance-Leahy Scale: Good     Standing balance support: Bilateral upper extremity supported, During functional activity, Reliant on assistive device for balance Standing balance-Leahy Scale: Fair                             ADL either performed or assessed with clinical judgement   ADL Overall ADL's : Needs assistance/impaired Eating/Feeding: Set up;Sitting   Grooming: Wash/dry hands;Wash/dry face;Sitting;Set up       Lower Body Bathing: Supervison/ safety;Sitting/lateral leans;Set up           Toilet Transfer: Supervision/safety;Rolling walker (2 wheels);Ambulation   Toileting- Clothing Manipulation and Hygiene: Supervision/safety;Set up;Sitting/lateral lean       Functional mobility during ADLs: Supervision/safety;Rolling walker (2 wheels) (approx 75')      Extremity/Trunk Assessment              Vision       Perception     Praxis      Cognition Arousal/Alertness: Awake/alert Behavior During Therapy: WFL for tasks assessed/performed Overall Cognitive Status: Within Functional Limits for tasks assessed                                          Exercises      Shoulder Instructions       General Comments vss throughout    Pertinent Vitals/ Pain  Pain Assessment Pain Assessment: No/denies pain  Home Living                                          Prior Functioning/Environment              Frequency  Min 2X/week        Progress Toward Goals  OT Goals(current goals can now be found in the care plan section)  Progress towards OT goals: Progressing toward goals     Plan Discharge plan remains appropriate    Co-evaluation                  AM-PAC OT "6 Clicks" Daily Activity     Outcome Measure   Help from another person eating meals?: None Help from another person taking care of personal grooming?: None Help from another person toileting, which includes using toliet, bedpan, or urinal?: None Help from another person bathing (including washing, rinsing, drying)?: A Little Help from another person to put on and taking off regular upper body clothing?: None Help from another person to put on and taking off regular lower body clothing?: A Little 6 Click Score: 22    End of Session Equipment Utilized During Treatment: Rolling walker (2 wheels)  OT Visit Diagnosis: Other abnormalities of gait and mobility (R26.89);Muscle weakness (generalized) (M62.81)   Activity Tolerance Patient tolerated treatment well   Patient Left in chair;with call bell/phone within reach   Nurse Communication Mobility status        Time: 6045-4098 OT Time Calculation (min): 15 min  Charges: OT General Charges $OT Visit: 1 Visit OT Treatments $Self Care/Home Management : 8-22 mins  Oleta Mouse, OTD OTR/L  07/22/22, 11:56 AM

## 2022-07-22 NOTE — TOC Progression Note (Signed)
Transition of Care [facility]) - Progression Note    Patient Details  Name: [name] MRN: [id number] Date of Birth: November 24, 1965  Transition of Care [facility] CM/SW Contact  Garret Reddish, RN Phone Number: 07/22/2022, 10:11 AM  Clinical Narrative:   Chart reviewed.  Noted that patient is being treated for UTI.  Noted urine culture showed gram ned rods.  Patient currently on IV Rocephin. Patient with Presyncope noted etiology unclear noted may be due to infection.    TOC will continue to follow for discharge needs.           Expected Discharge Plan and Services                                               Social Determinants of Health (SDOH) Interventions SDOH Screenings   Food Insecurity: No Food Insecurity (07/20/2022)  Recent Concern: Food Insecurity - Food Insecurity Present (06/28/2022)  Housing: Patient Declined (07/20/2022)  Transportation Needs: No Transportation Needs (07/20/2022)  Utilities: Not At Risk (07/20/2022)  Alcohol Screen: Low Risk  (03/26/2022)  Depression (PHQ2-9): Low Risk  (06/29/2022)  Financial Resource Strain: Medium Risk (06/28/2022)  Physical Activity: Insufficiently Active (06/28/2022)  Social Connections: Moderately Integrated (06/28/2022)  Stress: No Stress Concern Present (06/28/2022)  Tobacco Use: Medium Risk (07/20/2022)    Readmission Risk Interventions    06/11/2021   11:43 AM 05/19/2021   12:02 PM 04/24/2021    3:02 PM  Readmission Risk Prevention Plan  Transportation Screening Complete Complete Complete  PCP or Specialist Appt within 5-7 Days  Complete   Home Care Screening  Complete   Medication Review (RN CM)  Complete   HRI or Home Care Consult   Complete  Palliative Care Screening   Not Applicable  Medication Review (RN Care Manager) Complete  Complete  PCP or Specialist appointment within 3-5 days of discharge Complete    HRI or Home Care Consult Complete    SW Recovery Care/Counseling Consult Complete    Palliative Care  Screening Not Applicable    Skilled Nursing Facility Not Applicable

## 2022-08-03 ENCOUNTER — Observation Stay: Payer: 59

## 2022-08-03 ENCOUNTER — Encounter: Payer: Self-pay | Admitting: Student in an Organized Health Care Education/Training Program

## 2022-08-03 ENCOUNTER — Other Ambulatory Visit: Payer: Self-pay

## 2022-08-03 ENCOUNTER — Inpatient Hospital Stay
Admission: EM | Admit: 2022-08-03 | Discharge: 2022-08-11 | DRG: 683 | Disposition: A | Discharge status: Home Health | Payer: 59 | Attending: Internal Medicine | Admitting: Internal Medicine

## 2022-08-03 DIAGNOSIS — E1165 Type 2 diabetes mellitus with hyperglycemia: Secondary | ICD-10-CM | POA: Diagnosis present

## 2022-08-03 DIAGNOSIS — K429 Umbilical hernia without obstruction or gangrene: Secondary | ICD-10-CM | POA: Diagnosis not present

## 2022-08-03 DIAGNOSIS — E1143 Type 2 diabetes mellitus with diabetic autonomic (poly)neuropathy: Secondary | ICD-10-CM | POA: Diagnosis not present

## 2022-08-03 DIAGNOSIS — Z91011 Allergy to milk products: Secondary | ICD-10-CM

## 2022-08-03 DIAGNOSIS — Z8719 Personal history of other diseases of the digestive system: Secondary | ICD-10-CM

## 2022-08-03 DIAGNOSIS — F32A Depression, unspecified: Secondary | ICD-10-CM | POA: Diagnosis present

## 2022-08-03 DIAGNOSIS — I2510 Atherosclerotic heart disease of native coronary artery without angina pectoris: Secondary | ICD-10-CM | POA: Diagnosis not present

## 2022-08-03 DIAGNOSIS — I69354 Hemiplegia and hemiparesis following cerebral infarction affecting left non-dominant side: Secondary | ICD-10-CM | POA: Diagnosis not present

## 2022-08-03 DIAGNOSIS — G40A09 Absence epileptic syndrome, not intractable, without status epilepticus: Secondary | ICD-10-CM | POA: Diagnosis present

## 2022-08-03 DIAGNOSIS — R42 Dizziness and giddiness: Secondary | ICD-10-CM

## 2022-08-03 DIAGNOSIS — I5032 Chronic diastolic (congestive) heart failure: Secondary | ICD-10-CM | POA: Diagnosis not present

## 2022-08-03 DIAGNOSIS — K5730 Diverticulosis of large intestine without perforation or abscess without bleeding: Secondary | ICD-10-CM | POA: Diagnosis not present

## 2022-08-03 DIAGNOSIS — Z91138 Patient's unintentional underdosing of medication regimen for other reason: Secondary | ICD-10-CM

## 2022-08-03 DIAGNOSIS — R0689 Other abnormalities of breathing: Secondary | ICD-10-CM | POA: Diagnosis not present

## 2022-08-03 DIAGNOSIS — Z79899 Other long term (current) drug therapy: Secondary | ICD-10-CM

## 2022-08-03 DIAGNOSIS — I130 Hypertensive heart and chronic kidney disease with heart failure and stage 1 through stage 4 chronic kidney disease, or unspecified chronic kidney disease: Secondary | ICD-10-CM | POA: Diagnosis present

## 2022-08-03 DIAGNOSIS — Z87891 Personal history of nicotine dependence: Secondary | ICD-10-CM

## 2022-08-03 DIAGNOSIS — Z8049 Family history of malignant neoplasm of other genital organs: Secondary | ICD-10-CM

## 2022-08-03 DIAGNOSIS — Z7902 Long term (current) use of antithrombotics/antiplatelets: Secondary | ICD-10-CM

## 2022-08-03 DIAGNOSIS — Z86718 Personal history of other venous thrombosis and embolism: Secondary | ICD-10-CM

## 2022-08-03 DIAGNOSIS — M549 Dorsalgia, unspecified: Secondary | ICD-10-CM | POA: Diagnosis not present

## 2022-08-03 DIAGNOSIS — Z808 Family history of malignant neoplasm of other organs or systems: Secondary | ICD-10-CM

## 2022-08-03 DIAGNOSIS — Z833 Family history of diabetes mellitus: Secondary | ICD-10-CM

## 2022-08-03 DIAGNOSIS — Z8249 Family history of ischemic heart disease and other diseases of the circulatory system: Secondary | ICD-10-CM

## 2022-08-03 DIAGNOSIS — E860 Dehydration: Secondary | ICD-10-CM | POA: Diagnosis not present

## 2022-08-03 DIAGNOSIS — E1140 Type 2 diabetes mellitus with diabetic neuropathy, unspecified: Secondary | ICD-10-CM | POA: Diagnosis present

## 2022-08-03 DIAGNOSIS — I252 Old myocardial infarction: Secondary | ICD-10-CM

## 2022-08-03 DIAGNOSIS — K56609 Unspecified intestinal obstruction, unspecified as to partial versus complete obstruction: Secondary | ICD-10-CM | POA: Diagnosis not present

## 2022-08-03 DIAGNOSIS — I10 Essential (primary) hypertension: Secondary | ICD-10-CM | POA: Diagnosis present

## 2022-08-03 DIAGNOSIS — Z8701 Personal history of pneumonia (recurrent): Secondary | ICD-10-CM

## 2022-08-03 DIAGNOSIS — K567 Ileus, unspecified: Secondary | ICD-10-CM | POA: Diagnosis not present

## 2022-08-03 DIAGNOSIS — E785 Hyperlipidemia, unspecified: Secondary | ICD-10-CM | POA: Diagnosis not present

## 2022-08-03 DIAGNOSIS — Z794 Long term (current) use of insulin: Secondary | ICD-10-CM

## 2022-08-03 DIAGNOSIS — E119 Type 2 diabetes mellitus without complications: Secondary | ICD-10-CM

## 2022-08-03 DIAGNOSIS — N390 Urinary tract infection, site not specified: Secondary | ICD-10-CM | POA: Diagnosis not present

## 2022-08-03 DIAGNOSIS — N179 Acute kidney failure, unspecified: Secondary | ICD-10-CM | POA: Diagnosis not present

## 2022-08-03 DIAGNOSIS — Z90710 Acquired absence of both cervix and uterus: Secondary | ICD-10-CM

## 2022-08-03 DIAGNOSIS — Z888 Allergy status to other drugs, medicaments and biological substances status: Secondary | ICD-10-CM

## 2022-08-03 DIAGNOSIS — J4489 Other specified chronic obstructive pulmonary disease: Secondary | ICD-10-CM | POA: Diagnosis not present

## 2022-08-03 DIAGNOSIS — K219 Gastro-esophageal reflux disease without esophagitis: Secondary | ICD-10-CM | POA: Diagnosis not present

## 2022-08-03 DIAGNOSIS — T361X6A Underdosing of cephalosporins and other beta-lactam antibiotics, initial encounter: Secondary | ICD-10-CM | POA: Diagnosis present

## 2022-08-03 DIAGNOSIS — E11319 Type 2 diabetes mellitus with unspecified diabetic retinopathy without macular edema: Secondary | ICD-10-CM | POA: Diagnosis not present

## 2022-08-03 DIAGNOSIS — N3000 Acute cystitis without hematuria: Secondary | ICD-10-CM

## 2022-08-03 DIAGNOSIS — E1122 Type 2 diabetes mellitus with diabetic chronic kidney disease: Secondary | ICD-10-CM | POA: Diagnosis not present

## 2022-08-03 DIAGNOSIS — K3184 Gastroparesis: Secondary | ICD-10-CM | POA: Diagnosis not present

## 2022-08-03 DIAGNOSIS — E1169 Type 2 diabetes mellitus with other specified complication: Secondary | ICD-10-CM | POA: Diagnosis present

## 2022-08-03 DIAGNOSIS — Z6841 Body Mass Index (BMI) 40.0 and over, adult: Secondary | ICD-10-CM

## 2022-08-03 DIAGNOSIS — R739 Hyperglycemia, unspecified: Secondary | ICD-10-CM | POA: Diagnosis not present

## 2022-08-03 DIAGNOSIS — Z7951 Long term (current) use of inhaled steroids: Secondary | ICD-10-CM

## 2022-08-03 DIAGNOSIS — H5440 Blindness, one eye, unspecified eye: Secondary | ICD-10-CM | POA: Diagnosis present

## 2022-08-03 DIAGNOSIS — E861 Hypovolemia: Secondary | ICD-10-CM | POA: Diagnosis present

## 2022-08-03 DIAGNOSIS — W19XXXA Unspecified fall, initial encounter: Secondary | ICD-10-CM | POA: Diagnosis not present

## 2022-08-03 DIAGNOSIS — K5900 Constipation, unspecified: Secondary | ICD-10-CM | POA: Diagnosis not present

## 2022-08-03 DIAGNOSIS — E872 Acidosis, unspecified: Secondary | ICD-10-CM | POA: Diagnosis not present

## 2022-08-03 DIAGNOSIS — Z885 Allergy status to narcotic agent status: Secondary | ICD-10-CM

## 2022-08-03 DIAGNOSIS — Z743 Need for continuous supervision: Secondary | ICD-10-CM | POA: Diagnosis not present

## 2022-08-03 DIAGNOSIS — R140 Abdominal distension (gaseous): Secondary | ICD-10-CM | POA: Insufficient documentation

## 2022-08-03 DIAGNOSIS — N200 Calculus of kidney: Secondary | ICD-10-CM | POA: Diagnosis not present

## 2022-08-03 DIAGNOSIS — R300 Dysuria: Secondary | ICD-10-CM | POA: Diagnosis present

## 2022-08-03 DIAGNOSIS — R109 Unspecified abdominal pain: Secondary | ICD-10-CM | POA: Diagnosis not present

## 2022-08-03 DIAGNOSIS — N182 Chronic kidney disease, stage 2 (mild): Secondary | ICD-10-CM | POA: Diagnosis present

## 2022-08-03 DIAGNOSIS — Z87442 Personal history of urinary calculi: Secondary | ICD-10-CM

## 2022-08-03 DIAGNOSIS — F419 Anxiety disorder, unspecified: Secondary | ICD-10-CM | POA: Diagnosis present

## 2022-08-03 DIAGNOSIS — D3501 Benign neoplasm of right adrenal gland: Secondary | ICD-10-CM | POA: Diagnosis not present

## 2022-08-03 DIAGNOSIS — Z87440 Personal history of urinary (tract) infections: Secondary | ICD-10-CM

## 2022-08-03 DIAGNOSIS — E66813 Obesity, class 3: Secondary | ICD-10-CM | POA: Diagnosis present

## 2022-08-03 LAB — URINALYSIS, ROUTINE W REFLEX MICROSCOPIC
Bilirubin Urine: NEGATIVE
Glucose, UA: >=500 mg/dL — AB
Hgb urine dipstick: NEGATIVE
Ketones, ur: NEGATIVE mg/dL
Nitrite: NEGATIVE
Protein, ur: NEGATIVE mg/dL
Specific Gravity, Urine: 1.011 (ref 1.005–1.030)
pH: 5 (ref 5.0–8.0)

## 2022-08-03 LAB — CBC WITH DIFFERENTIAL/PLATELET
Abs Immature Granulocytes: 0.03 10*3/uL (ref 0.00–0.07)
Basophils Absolute: 0 10*3/uL (ref 0.0–0.1)
Basophils Relative: 0 %
Eosinophils Absolute: 0 10*3/uL (ref 0.0–0.5)
Eosinophils Relative: 0 %
HCT: 42.3 % (ref 36.0–46.0)
Hemoglobin: 13.5 g/dL (ref 12.0–15.0)
Immature Granulocytes: 0 %
Lymphocytes Relative: 24 %
Lymphs Abs: 1.7 10*3/uL (ref 0.7–4.0)
MCH: 28.8 pg (ref 26.0–34.0)
MCHC: 31.9 g/dL (ref 30.0–36.0)
MCV: 90.4 fL (ref 80.0–100.0)
Monocytes Absolute: 0.5 10*3/uL (ref 0.1–1.0)
Monocytes Relative: 7 %
Neutro Abs: 4.8 10*3/uL (ref 1.7–7.7)
Neutrophils Relative %: 69 %
Platelets: 265 10*3/uL (ref 150–400)
RBC: 4.68 MIL/uL (ref 3.87–5.11)
RDW: 12.8 % (ref 11.5–15.5)
WBC: 7.1 10*3/uL (ref 4.0–10.5)
nRBC: 0 % (ref 0.0–0.2)

## 2022-08-03 LAB — LACTIC ACID, PLASMA
Lactic Acid, Venous: 2.6 mmol/L (ref 0.5–1.9)
Lactic Acid, Venous: 1.6 mmol/L (ref 0.5–1.9)

## 2022-08-03 LAB — BLOOD GAS, VENOUS
Acid-Base Excess: 0.7 mmol/L (ref 0.0–2.0)
Bicarbonate: 25.4 mmol/L (ref 20.0–28.0)
O2 Saturation: 66.5 %
Patient temperature: 37
pCO2, Ven: 40 mmHg — ABNORMAL LOW (ref 44–60)
pH, Ven: 7.41 (ref 7.25–7.43)
pO2, Ven: 40 mmHg (ref 32–45)

## 2022-08-03 LAB — COMPREHENSIVE METABOLIC PANEL
ALT: 27 U/L (ref 0–44)
AST: 16 U/L (ref 15–41)
Albumin: 3.8 g/dL (ref 3.5–5.0)
Alkaline Phosphatase: 99 U/L (ref 38–126)
Anion gap: 10 (ref 5–15)
BUN: 15 mg/dL (ref 6–20)
CO2: 22 mmol/L (ref 22–32)
Calcium: 8.8 mg/dL — ABNORMAL LOW (ref 8.9–10.3)
Chloride: 101 mmol/L (ref 98–111)
Creatinine, Ser: 0.95 mg/dL (ref 0.44–1.00)
GFR, Estimated: >60 mL/min (ref >60)
Glucose, Bld: 435 mg/dL — ABNORMAL HIGH (ref 70–99)
Potassium: 3.8 mmol/L (ref 3.5–5.1)
Sodium: 133 mmol/L — ABNORMAL LOW (ref 135–145)
Total Bilirubin: 0.5 mg/dL (ref 0.3–1.2)
Total Protein: 7.7 g/dL (ref 6.5–8.1)

## 2022-08-03 LAB — GLUCOSE, CAPILLARY: Glucose-Capillary: 347 mg/dL — ABNORMAL HIGH (ref 70–99)

## 2022-08-03 LAB — BETA-HYDROXYBUTYRIC ACID: Beta-Hydroxybutyric Acid: 0.19 mmol/L (ref 0.05–0.27)

## 2022-08-03 LAB — CBG MONITORING, ED
Glucose-Capillary: 398 mg/dL — ABNORMAL HIGH (ref 70–99)
Glucose-Capillary: 216 mg/dL — ABNORMAL HIGH (ref 70–99)

## 2022-08-03 MED ORDER — GABAPENTIN 300 MG PO CAPS
600.0000 mg | ORAL_CAPSULE | Freq: Two times a day (BID) | ORAL | Status: DC
  Administered 2022-08-04 – 2022-08-11 (×14): 600 mg via ORAL
  Filled 2022-08-03 (×15): qty 2

## 2022-08-03 MED ORDER — ACETAMINOPHEN 325 MG PO TABS
650.0000 mg | ORAL_TABLET | Freq: Four times a day (QID) | ORAL | Status: DC | PRN
  Administered 2022-08-04 – 2022-08-08 (×3): 650 mg via ORAL
  Filled 2022-08-03 (×3): qty 2

## 2022-08-03 MED ORDER — FLUTICASONE FUROATE-VILANTEROL 100-25 MCG/ACT IN AEPB
1.0000 | INHALATION_SPRAY | Freq: Every day | RESPIRATORY_TRACT | Status: DC
  Administered 2022-08-04 – 2022-08-11 (×8): 1 via RESPIRATORY_TRACT
  Filled 2022-08-03: qty 28

## 2022-08-03 MED ORDER — IRBESARTAN 150 MG PO TABS
300.0000 mg | ORAL_TABLET | Freq: Every day | ORAL | Status: DC
  Administered 2022-08-03 – 2022-08-06 (×4): 300 mg via ORAL
  Filled 2022-08-03 (×5): qty 2

## 2022-08-03 MED ORDER — CARVEDILOL 25 MG PO TABS
25.0000 mg | ORAL_TABLET | Freq: Two times a day (BID) | ORAL | Status: DC
  Administered 2022-08-04 – 2022-08-11 (×15): 25 mg via ORAL
  Filled 2022-08-03 (×15): qty 1

## 2022-08-03 MED ORDER — SULFAMETHOXAZOLE-TRIMETHOPRIM 800-160 MG PO TABS: 1.0000 | ORAL_TABLET | Freq: Two times a day (BID) | ORAL | 0 refills | Status: DC

## 2022-08-03 MED ORDER — INSULIN ASPART 100 UNIT/ML IJ SOLN
0.0000 [IU] | Freq: Three times a day (TID) | INTRAMUSCULAR | Status: DC
  Administered 2022-08-04 (×2): 11 [IU] via SUBCUTANEOUS
  Administered 2022-08-04: 3 [IU] via SUBCUTANEOUS
  Administered 2022-08-05 (×2): 11 [IU] via SUBCUTANEOUS
  Administered 2022-08-05 – 2022-08-06 (×2): 3 [IU] via SUBCUTANEOUS
  Administered 2022-08-06: 15 [IU] via SUBCUTANEOUS
  Administered 2022-08-07: 5 [IU] via SUBCUTANEOUS
  Administered 2022-08-07: 3 [IU] via SUBCUTANEOUS
  Administered 2022-08-08: 2 [IU] via SUBCUTANEOUS
  Administered 2022-08-08: 3 [IU] via SUBCUTANEOUS
  Administered 2022-08-09: 5 [IU] via SUBCUTANEOUS
  Administered 2022-08-10 (×2): 3 [IU] via SUBCUTANEOUS
  Administered 2022-08-11: 5 [IU] via SUBCUTANEOUS
  Filled 2022-08-03 (×18): qty 1

## 2022-08-03 MED ORDER — MIRABEGRON ER 50 MG PO TB24
50.0000 mg | ORAL_TABLET | Freq: Every day | ORAL | Status: DC
  Administered 2022-08-04 – 2022-08-07 (×3): 50 mg via ORAL
  Filled 2022-08-03 (×5): qty 1

## 2022-08-03 MED ORDER — SODIUM CHLORIDE 0.9% FLUSH
3.0000 mL | Freq: Two times a day (BID) | INTRAVENOUS | Status: DC
  Administered 2022-08-03 – 2022-08-11 (×15): 3 mL via INTRAVENOUS

## 2022-08-03 MED ORDER — AMITRIPTYLINE HCL 50 MG PO TABS
50.0000 mg | ORAL_TABLET | Freq: Every day | ORAL | Status: DC
  Administered 2022-08-03 – 2022-08-10 (×8): 50 mg via ORAL
  Filled 2022-08-03 (×9): qty 1

## 2022-08-03 MED ORDER — DULOXETINE HCL 30 MG PO CPEP
60.0000 mg | ORAL_CAPSULE | Freq: Every day | ORAL | Status: DC
  Administered 2022-08-04 – 2022-08-11 (×8): 60 mg via ORAL
  Filled 2022-08-03 (×8): qty 2

## 2022-08-03 MED ORDER — CYCLOSPORINE 0.05 % OP EMUL
1.0000 [drp] | Freq: Two times a day (BID) | OPHTHALMIC | Status: DC
  Administered 2022-08-04 – 2022-08-11 (×15): 1 [drp] via OPHTHALMIC
  Filled 2022-08-03 (×16): qty 30

## 2022-08-03 MED ORDER — POLYETHYLENE GLYCOL 3350 17 G PO PACK
17.0000 g | PACK | Freq: Every day | ORAL | Status: DC
  Administered 2022-08-03 – 2022-08-04 (×2): 17 g via ORAL
  Filled 2022-08-03 (×2): qty 1

## 2022-08-03 MED ORDER — PANTOPRAZOLE SODIUM 20 MG PO TBEC
20.0000 mg | DELAYED_RELEASE_TABLET | Freq: Every day | ORAL | Status: DC
  Administered 2022-08-04 – 2022-08-11 (×8): 20 mg via ORAL
  Filled 2022-08-03 (×8): qty 1

## 2022-08-03 MED ORDER — INSULIN ASPART 100 UNIT/ML IJ SOLN
30.0000 [IU] | Freq: Three times a day (TID) | INTRAMUSCULAR | Status: DC
  Administered 2022-08-04 (×2): 30 [IU] via SUBCUTANEOUS
  Filled 2022-08-03 (×2): qty 1

## 2022-08-03 MED ORDER — INSULIN ASPART 100 UNIT/ML IJ SOLN
0.0000 [IU] | Freq: Every day | INTRAMUSCULAR | Status: DC
  Administered 2022-08-03: 4 [IU] via SUBCUTANEOUS
  Administered 2022-08-10: 2 [IU] via SUBCUTANEOUS
  Filled 2022-08-03 (×2): qty 1

## 2022-08-03 MED ORDER — BUTALBITAL-APAP-CAFFEINE 50-325-40 MG PO TABS: 1.0000 | ORAL_TABLET | ORAL | Status: DC | PRN

## 2022-08-03 MED ORDER — ENOXAPARIN SODIUM 60 MG/0.6ML IJ SOSY
0.5000 mg/kg | PREFILLED_SYRINGE | INTRAMUSCULAR | Status: DC
  Administered 2022-08-04 – 2022-08-10 (×7): 57.5 mg via SUBCUTANEOUS
  Filled 2022-08-03 (×7): qty 0.6

## 2022-08-03 MED ORDER — ROSUVASTATIN CALCIUM 10 MG PO TABS
10.0000 mg | ORAL_TABLET | Freq: Every day | ORAL | Status: DC
  Administered 2022-08-03 – 2022-08-11 (×9): 10 mg via ORAL
  Filled 2022-08-03 (×10): qty 1

## 2022-08-03 MED ORDER — SULFAMETHOXAZOLE-TRIMETHOPRIM 800-160 MG PO TABS
1.0000 | ORAL_TABLET | Freq: Once | ORAL | Status: AC
  Administered 2022-08-03: 1 via ORAL
  Filled 2022-08-03: qty 1

## 2022-08-03 MED ORDER — GABAPENTIN 400 MG PO CAPS
1200.0000 mg | ORAL_CAPSULE | Freq: Every day | ORAL | Status: DC
  Administered 2022-08-03 – 2022-08-10 (×8): 1200 mg via ORAL
  Filled 2022-08-03 (×8): qty 3

## 2022-08-03 MED ORDER — LACTATED RINGERS IV SOLN
INTRAVENOUS | Status: DC
Start: 2022-08-03 — End: 2022-08-03

## 2022-08-03 MED ORDER — SODIUM CHLORIDE 0.9 % IV SOLN
1.0000 g | INTRAVENOUS | Status: DC
  Administered 2022-08-04 – 2022-08-07 (×5): 1 g via INTRAVENOUS
  Filled 2022-08-03 (×6): qty 10

## 2022-08-03 MED ORDER — CLOPIDOGREL BISULFATE 75 MG PO TABS
75.0000 mg | ORAL_TABLET | Freq: Every day | ORAL | Status: DC
  Administered 2022-08-04 – 2022-08-11 (×8): 75 mg via ORAL
  Filled 2022-08-03 (×8): qty 1

## 2022-08-03 MED ORDER — GABAPENTIN 300 MG PO CAPS
600.0000 mg | ORAL_CAPSULE | Freq: Three times a day (TID) | ORAL | Status: DC
  Filled 2022-08-03: qty 2

## 2022-08-03 MED ORDER — INSULIN ASPART 100 UNIT/ML IJ SOLN
5.0000 [IU] | Freq: Once | INTRAMUSCULAR | Status: AC
  Administered 2022-08-03: 5 [IU] via INTRAVENOUS
  Filled 2022-08-03: qty 1

## 2022-08-03 MED ORDER — SODIUM CHLORIDE 0.9 % IV BOLUS
1000.0000 mL | Freq: Once | INTRAVENOUS | Status: AC
  Administered 2022-08-03: 1000 mL via INTRAVENOUS

## 2022-08-03 MED ORDER — LACTATED RINGERS IV SOLN: INTRAVENOUS | Status: DC

## 2022-08-03 MED ORDER — LEVETIRACETAM 750 MG PO TABS
750.0000 mg | ORAL_TABLET | Freq: Every day | ORAL | Status: DC
  Administered 2022-08-04 – 2022-08-10 (×7): 750 mg via ORAL
  Filled 2022-08-03 (×8): qty 1

## 2022-08-03 MED ORDER — INSULIN GLARGINE-YFGN 100 UNIT/ML ~~LOC~~ SOLN
70.0000 [IU] | Freq: Every day | SUBCUTANEOUS | Status: DC
  Administered 2022-08-04 – 2022-08-08 (×5): 70 [IU] via SUBCUTANEOUS
  Filled 2022-08-03 (×6): qty 0.7

## 2022-08-03 MED ORDER — LEVETIRACETAM 500 MG PO TABS
500.0000 mg | ORAL_TABLET | Freq: Every day | ORAL | Status: DC
  Administered 2022-08-04 – 2022-08-11 (×8): 500 mg via ORAL
  Filled 2022-08-03 (×9): qty 1

## 2022-08-03 MED ORDER — AMLODIPINE BESYLATE 10 MG PO TABS
10.0000 mg | ORAL_TABLET | Freq: Every day | ORAL | Status: DC
  Administered 2022-08-04 – 2022-08-11 (×8): 10 mg via ORAL
  Filled 2022-08-03 (×8): qty 1

## 2022-08-03 MED ORDER — FUROSEMIDE 20 MG PO TABS
20.0000 mg | ORAL_TABLET | Freq: Every day | ORAL | Status: DC
  Administered 2022-08-04 – 2022-08-11 (×8): 20 mg via ORAL
  Filled 2022-08-03 (×8): qty 1

## 2022-08-03 MED ORDER — HYDRALAZINE HCL 50 MG PO TABS
50.0000 mg | ORAL_TABLET | Freq: Three times a day (TID) | ORAL | Status: DC
  Administered 2022-08-03 – 2022-08-11 (×21): 50 mg via ORAL
  Filled 2022-08-03 (×22): qty 1

## 2022-08-03 MED ORDER — POLYETHYLENE GLYCOL 3350 17 G PO PACK: 17.0000 g | PACK | Freq: Every day | ORAL | Status: DC | PRN

## 2022-08-03 MED ORDER — ACETAMINOPHEN 650 MG RE SUPP: 650.0000 mg | Freq: Four times a day (QID) | RECTAL | Status: DC | PRN

## 2022-08-03 MED ORDER — ALBUTEROL SULFATE (2.5 MG/3ML) 0.083% IN NEBU: 3.0000 mL | INHALATION_SOLUTION | Freq: Four times a day (QID) | RESPIRATORY_TRACT | Status: DC | PRN

## 2022-08-03 NOTE — ED Notes (Signed)
Dr Rosalia Hammers aware of Lactic Acid result. No new orders at this time.

## 2022-08-03 NOTE — Assessment & Plan Note (Signed)
This is a chronic complaint of the patient when patient has hyperglycemia.  At this time I suspect patient was dehydrated at the time of her evaluation at home due to hyperglycemia.  Patient has received IV fluids, hyperglycemia has been better controlled in the hospital.  I will check orthostatic vitals

## 2022-08-03 NOTE — ED Provider Notes (Signed)
[facility] Provider Note    Event Date/Time   First MD Initiated Contact with Patient 08/03/22 1402     (approximate)   History   Hyperglycemia   HPI  [name] is a 57 y.o. female presenting to the emergency department for evaluation of weakness.  Patient states that over the last several days she has felt generally weak which she reports is common when her blood sugar is high.  Her blood sugar usually runs in the 200s, but this morning she noticed it was 462.  She reports she has been compliant with her insulin. She was recently admitted at the beginning of this month for hyperglycemia with associated UTI.  She reports she completed her oral antibiotics.  States that her urine is no longer cloudy, but does report some ongoing dysuria.  Denies chest pain, shortness of breath.      Physical Exam   Triage Vital Signs: ED Triage Vitals  Encounter Vitals Group     BP 08/03/22 1359 (!) 125/90     Systolic BP Percentile --      Diastolic BP Percentile --      Pulse Rate 08/03/22 1359 90     Resp 08/03/22 1359 18     Temp 08/03/22 1418 98.4 F (36.9 C)     Temp Source 08/03/22 1418 Oral     SpO2 08/03/22 1418 97 %     Weight --      Height --      Head Circumference --      Peak Flow --      Pain Score --      Pain Loc --      Pain Education --      Exclude from Growth Chart --     Most recent vital signs: Vitals:   08/03/22 1400 08/03/22 1418  BP: (!) 142/87   Pulse: 91   Resp: 13   Temp:  98.4 F (36.9 C)  SpO2:  97%     General: Awake, interactive  CV:  Regular rate, good peripheral perfusion.  Resp:  Lungs clear, unlabored respirations.  Abd:  Soft, nondistended, no focal tenderness Neuro:  Symmetric facial movement, fluid speech, 5 out of 5 strength in the bilateral upper and lower extremities with intact sensation and normal coordination  ED Results / Procedures / Treatments   Labs (all labs ordered are listed, but  only abnormal results are displayed) Labs Reviewed  COMPREHENSIVE METABOLIC PANEL - Abnormal; Notable for the following components:      Result Value   Sodium 133 (*)    Glucose, Bld 435 (*)    Calcium 8.8 (*)    All other components within normal limits  BLOOD GAS, VENOUS - Abnormal; Notable for the following components:   pCO2, Ven 40 (*)    All other components within normal limits  URINALYSIS, ROUTINE W REFLEX MICROSCOPIC - Abnormal; Notable for the following components:   Color, Urine STRAW (*)    APPearance CLEAR (*)    Glucose, UA >=500 (*)    Leukocytes,Ua MODERATE (*)    Bacteria, UA RARE (*)    All other components within normal limits  LACTIC ACID, PLASMA - Abnormal; Notable for the following components:   Lactic Acid, Venous 2.6 (*)    All other components within normal limits  CBG MONITORING, ED - Abnormal; Notable for the following components:   Glucose-Capillary 398 (*)    All other components within normal  limits  URINE CULTURE  CBC WITH DIFFERENTIAL/PLATELET  BETA-HYDROXYBUTYRIC ACID  LACTIC ACID, PLASMA     EKG EKG independently reviewed interpreted by myself (ER attending) demonstrates:  EKGs demonstrates sinus rhythm at a rate of 91, PR 174, QRS 79, QTc 468  RADIOLOGY Imaging independently reviewed and interpreted by myself demonstrates:    PROCEDURES:  Critical Care performed: No  Procedures   MEDICATIONS ORDERED IN ED: Medications  sodium chloride 0.9 % bolus 1,000 mL (has no administration in time range)  sodium chloride 0.9 % bolus 1,000 mL (1,000 mLs Intravenous New Bag/Given 08/03/22 1450)  insulin aspart (novoLOG) injection 5 Units (5 Units Intravenous Given 08/03/22 1451)  sulfamethoxazole-trimethoprim (BACTRIM DS) 800-160 MG per tablet 1 tablet (1 tablet Oral Given 08/03/22 1614)     IMPRESSION / MDM / ASSESSMENT AND PLAN / ED COURSE  I reviewed the triage vital signs and the nursing notes.  Differential diagnosis includes, but is  not limited to, DKA, hyperglycemia without DKA, anemia, electrolyte abnormality  Patient's presentation is most consistent with acute presentation with potential threat to life or bodily function.  57 year old female presenting with weakness and hyperglycemia.  Initial lab work with normal white blood cell count, elevated blood glucose 35, but normal bicarb and anion gap, not consistent with DKA.  VBG with normal pH.  Lactate is elevated at 2.6.  Patient was ordered for fluid resuscitation and some additional insulin.  Her urinalysis is slightly concerning for infection with moderate leukocyte Estrace, rare bacteria, 11-20 white blood cells, but this is overall improved prior.  Do think it is reasonable to treat with empiric oral antibiotics with Bactrim and send a urine culture.  Signed out to oncoming provider pending reevaluation after fluids and insulin.  If glucose is improved and patient is tolerating p.o., do think she may be stable for discharge.    FINAL CLINICAL IMPRESSION(S) / ED DIAGNOSES   Final diagnoses:  Hyperglycemia     Rx / DC Orders   ED Discharge Orders          Ordered    sulfamethoxazole-trimethoprim (BACTRIM DS) 800-160 MG tablet  2 times daily        08/03/22 1543             Note:  This document was prepared using Dragon voice recognition software and may include unintentional dictation errors.   Trinna Post, MD 08/03/22 [id number]

## 2022-08-03 NOTE — Assessment & Plan Note (Signed)
I intend to continue patient's Lantus as well as insulin sliding scale.  I will put in a routine request in the system for endocrinology consult for further help in controlling this patient's hyperglycemia

## 2022-08-03 NOTE — ED Triage Notes (Signed)
Pt bib ems with reports of hyperglycemia. Daughter reports pt with confusion, lethargy. CBG 462. Pt endorses NVD over the last few days.  140/70 HR 90 NSR 97% RA

## 2022-08-03 NOTE — Assessment & Plan Note (Addendum)
This has to be interpreted in the context of patient's urine culture from July 19, 2022 which grew E. coli with intermediate sensitivity to ciprofloxacin.  At the time of patient's discharge on July 22, 2022.  Patient was advised to stop taking ciprofloxacin and be treated with Keflex.  Unfortunately patient took ciprofloxacin for approximately 7 days and subsequent to that only started taking Keflex.  Unfortunately patient has failed this treatment and now has ongoing symptoms of dysuria.  With associated leukocytes in the urine.  As well as mild lactic acidosis.  Additional factors for failure in this patient would include her uncontrolled diabetes mellitus with hyperglycemia.  Regardless for thoroughness I will do an ultrasound study of the kidney ureter bladder.  Lactic acidosis has resolved with IV fluids.  Not felt to represent sepsis at this time and may be due to hyperglycemia/DM itself.[name] Please folow up urine culture sent from ER. Rx with ceftraxone. S.p PO bactrim in ER X 1 Ceftriaxone 1 g IV daily

## 2022-08-03 NOTE — H&P (Signed)
History and Physical    Patient: [name] WUJ:[id number] DOB: 12/13/1965 DOA: 08/03/2022 DOS: the patient was seen and examined on 08/03/2022 PCP: [name], [name]  Patient coming from: Home  Chief Complaint:  Chief Complaint  Patient presents with   Hyperglycemia   HPI: [name] is a 57 y.o. female with medical history significant of discharge from [facility] on July 22, 2022 after treatment for urinary tract infection.  Review of patient culture data in the system shows patient was cultured for urine on July 19, 2022.  It grew E. coli and it was not sensitive to ciprofloxacin.  Patient indicates that when patient reached home on July 22, 2022.  There was a prescription of ciprofloxacin at home.  She also had a prescription of Keflex, which is corroborated by the discharge summary from the discharging doctor.  However I am not sure how the patient got a prescription for ciprofloxacin.  Patient advises that she first finished the course of ciprofloxacin, and then 4 days ago started taking her Keflex, that she finished yesterday.  Patient claims that since the time of discharge she has had persistent sensation of dysuria.  Since she finally finished her antibiotic therapy yesterday, and she was having persistent dysuria, patient had plan to come to the ER.  Patient denies any fevers chills or rigors.  Patient has chronic occasional vomiting, no change in that symptoms.  Patient reports that she has had worsening hyperglycemia over the last 24 hours.  Patient has chronic complaint of lightheadedness/presyncope when her hyperglycemia worsens.  Earlier this afternoon, patient reports a glucose of close to 500 that was measured at home.  This was associated with generalized weakness and lightheadedness/dizziness per patient.  This prompted the patient to come to the ER.  At this time patient continues to report weakness and light headedness. No vertigo. No new focal weakness  (patient has chronic lower extremity weakness from prior strokes).  ER course is notable for finding of lactic acidosis, hyperglycemia, improved with hydration. Medical eval is sought. Patient received bactrim in ER.  Review of Systems: As mentioned in the history of present illness. All other systems reviewed and are negative. Past Medical History:  Diagnosis Date   Anxiety and depression 09/13/2006   Qualifier: Diagnosis of  By: Daphine Deutscher [name], [place]     Arthritis    joint pain    Asthma    COPD (chronic obstructive pulmonary disease) (HCC)    chronic bronchitis    Depression    Diabetes mellitus    since age 26; type 2 IDDM   Diabetic neuropathy, painful (HCC)    FEET AND HANDS   Diabetic retinopathy (HCC)    Diastolic dysfunction    a.  Echo 11/17: EF 60-65%, mild LVH, no RWMA, Gr1DD, mild AI, dilated aortic root measuring 38 mm, mildly dilated ascending aorta   Dilated aortic root (HCC)    38mm by echo 11/2015   Dizziness    secondary to diabetes and hypertension    Epilepsy idiopathic petit mal (HCC)    last seizure 2012;controlled w/ topomax   GERD (gastroesophageal reflux disease)    Glaucoma    NOT ON ANY EYE DROPS    Headache(784.0)    migraines    Heart murmur    born with    History of stress test    a. 11/17: Normal perfusion, EF 53%, normal study   Hx of blood clots    hematomas removed from left side  of brain from 11mos to 57yrs old    Hyperlipidemia    Hypertension    Legally blind    left eye    MIGRAINE HEADACHE 09/14/2006   Qualifier: Diagnosis of  By: Daphine Deutscher [name], Nykedtra     Myocardial infarction [facility])    a.  Patient reported history of without objective documentation   Nephrolithiasis    frequent urination , urination at nite  PT SEEN IN ER 09/22/11 FOR BACK AND RT SIDED PAIN--HAS KNOWN STONE RT URETER AND UA IN ER SHOWED UTI   Pain 09/23/2011   LOWER BACK AND RIGHT SIDE--PT HAS RIGHT URETERAL STONE   Pneumonia    hx of 2009   Pyelonephritis     SBO (small bowel obstruction) (HCC) 04/23/2021   Seizures (HCC)    Sleep apnea    [facility];does not use Cpap ; mild   Small bowel obstruction (HCC) 05/18/2021   Stress incontinence    Stroke (HCC)    last 2003  RESIDUAL LEFT LEG WEAKNESS--NO OTHER RESIDUAL PROBLEMS   Past Surgical History:  Procedure Laterality Date   BRAIN HEMATOMA EVACUATION     five procedures total, first procedure when 72 months old, last at 57 years of age.   CYSTOSCOPY W/ RETROGRADES  09/24/2011   Procedure: CYSTOSCOPY WITH RETROGRADE PYELOGRAM;  Surgeon: Anner Crete, MD;  Location: [facility];  Service: Urology;  Laterality: Right;   CYSTOSCOPY WITH URETEROSCOPY  09/24/2011   Procedure: CYSTOSCOPY WITH URETEROSCOPY;  Surgeon: Anner Crete, MD;  Location: [facility];  Service: Urology;  Laterality: Right;  Balloon dilation right ureter    CYSTOSCOPY/RETROGRADE/URETEROSCOPY  08/24/2011   Procedure: CYSTOSCOPY/RETROGRADE/URETEROSCOPY;  Surgeon: Milford Cage, MD;  Location: [facility];  Service: Urology;  Laterality: Right;  Cysto, Right retrograde Pyelogram, right stent placement.    INCISIONAL HERNIA REPAIR  05/05/2011   Procedure: LAPAROSCOPIC INCISIONAL HERNIA;  Surgeon: Emelia Loron, MD;  Location: [facility] OR;  Service: General;  Laterality: N/A;   kidney stone removal     LAPAROTOMY N/A 06/12/2021   Procedure: EXPLORATORY LAPAROTOMY, LYSIS OF ADHESIONS;  Surgeon: Henrene Dodge, MD;  Location: [facility] ORS;  Service: General;  Laterality: N/A;   MASS EXCISION  05/05/2011   Procedure: EXCISION MASS;  Surgeon: Emelia Loron, MD;  [facility] OR;  Service: General;  Laterality: Right;   [facility]     RETINAL DETACHMENT SURGERY Left 1990   RIGHT/LEFT HEART CATH AND CORONARY ANGIOGRAPHY N/A 04/28/2019   Procedure: RIGHT/LEFT HEART CATH AND CORONARY ANGIOGRAPHY;  Surgeon: Alwyn Pea, MD;  Location: [facility] INVASIVE CV LAB;  Service: Cardiovascular;  Laterality: N/A;   SHUNT REMOVAL     shunt inserted at  age 24 removed at age 19    VAGINAL HYSTERECTOMY  34   Social History:  reports that she quit smoking about 35 years ago. Her smoking use included cigarettes. She started smoking about 50 years ago. She has a 15 pack-year smoking history. She has never used smokeless tobacco. She reports current alcohol use of about 1.0 standard drink of alcohol per week. She reports that she does not use drugs.  Allergies  Allergen Reactions   Codeine Swelling and Other (See Comments)    Swelling and burning of mouth (inside)   [place] [Dapagliflozin] Other (See Comments)    Recurrent UTI with farxiga   Lactose Intolerance (Gi) Nausea And Vomiting    Family History  Problem Relation Age of Onset   Uterine cancer Mother  Hypertension Mother    Cancer Mother    Brain cancer Maternal Grandmother    Hypertension Maternal Grandmother    Birth defects Daughter    Hypertension Daughter    Cirrhosis Maternal Grandfather    ADD / ADHD Maternal Grandfather    Birth defects Maternal Grandfather    Diabetes Maternal Grandfather    Anesthesia problems Neg Hx    Colon cancer Neg Hx    Esophageal cancer Neg Hx    Pancreatic cancer Neg Hx     Prior to Admission medications   Medication Sig Start Date End Date Taking? Authorizing Provider  sulfamethoxazole-trimethoprim (BACTRIM DS) 800-160 MG tablet Take 1 tablet by mouth 2 (two) times daily for 5 days. 08/03/22 08/08/22 Yes Pilar Jarvis, MD  albuterol (VENTOLIN HFA) 108 (90 Base) MCG/ACT inhaler Inhale 1-2 puffs into the lungs every 6 (six) hours as needed. 06/30/22 09/28/22  [provider]  amitriptyline (ELAVIL) 25 MG tablet Take 2-3 tablets (50-75 mg total) by mouth at bedtime. 07/01/22 12/28/22  Edward Jolly, MD  amLODipine (NORVASC) 10 MG tablet Take 10 mg by mouth daily. 06/01/22   [provider]  BD PEN NEEDLE NANO 2ND GEN 32G X 4 MM MISC USE WITH HUMALOG TO FILL PUMP DAILY 03/23/22   Reather Littler, MD  blood glucose meter kit and supplies  KIT Dispense based on patient and insurance preference. Use up to four times daily as directed. (FOR E11.65 Z79.4). 11/17/21   [name], [name]  butalbital-acetaminophen-caffeine (FIORICET) 704 041 7025 MG tablet Take 1-2 tablets by mouth every 6 (six) hours as needed for headache. 07/01/22 07/01/23  Edward Jolly, MD  carvedilol (COREG) 25 MG tablet Take 25 mg by mouth 2 (two) times daily. 03/06/21   [provider]  cholecalciferol (VITAMIN D3) 25 MCG (1000 UNIT) tablet Take 1,000 Units by mouth daily.    [provider]  cloNIDine (CATAPRES) 0.1 MG tablet Take 0.1 mg by mouth daily as needed (For blood pressure overm160/90).    [provider]  clopidogrel (PLAVIX) 75 MG tablet Take 1 tablet (75 mg total) by mouth daily. 10/10/19   Jamelle Haring, MD  cycloSPORINE (RESTASIS) 0.05 % ophthalmic emulsion Place 2 drops into both eyes 2 (two) times daily.    [provider]  Dulaglutide (TRULICITY) 4.5 MG/0.5ML SOPN Inject 4.5 mg as directed once a week. Patient not taking: Reported on 07/19/2022 06/29/22   [name], [name]  DULoxetine (CYMBALTA) 60 MG capsule Take 1 capsule (60 mg total) by mouth daily. 08/14/21   Berniece Salines, [name]  EPINEPHrine 0.3 mg/0.3 mL IJ SOAJ injection Inject 0.3 mg into the muscle as needed for anaphylaxis. 03/29/19   [provider]  ezetimibe (ZETIA) 10 MG tablet Take 1 tablet (10 mg total) by mouth daily. 08/14/21   Berniece Salines, [name]  furosemide (LASIX) 20 MG tablet Take 20 mg by mouth daily.    [provider]  gabapentin (NEURONTIN) 600 MG tablet 600 mg qAM, 600 mg qPM, 1200 mg QHS 07/01/22   Edward Jolly, MD  Glucagon (GVOKE HYPOPEN 2-PACK) 1 MG/0.2ML SOAJ INJECT 1 MG INTO THE SKIN ONCE AS NEEDED FOR UP TO 1 DOSE (HYPOGLYCEMIA). ONLY FOR SEVERE LOW BLOOD SUGAR NOT RESPONDING TO DRINKING JUICE/GLUCOSE TABLETS, WHEN CBG IS <70 05/22/22   [name], [name]  Glucose Blood (BLOOD GLUCOSE TEST STRIPS) STRP Use as  directed to monitor blood sugar up to 4x a day FOR E11.65 Z79.4 11/17/21   [name], [name]  hydrALAZINE (APRESOLINE) 50 MG tablet Take 1 tablet by mouth 3 (three) times daily. 05/22/22   [provider]  insulin glargine, 2 Unit Dial, (TOUJEO MAX SOLOSTAR) 300 UNIT/ML Solostar Pen 70 units in am 07/21/22   Reather Littler, MD  insulin lispro (HUMALOG KWIKPEN) 100 UNIT/ML KwikPen Inject up to 30 units before each meal 07/21/22   Reather Littler, MD  Lancets MISC Use as directed to monitor blood sugar up to 4x a day FOR E11.65 Z79.4 11/17/21   [name], [name]  levETIRAcetam (KEPPRA) 500 MG tablet Take 500-750 mg by mouth See admin instructions. Take 1 tablet (500mg ) by mouth every morning and take 1 tablets by mouth (750mg ) by mouth every night    [provider]  meclizine (ANTIVERT) 25 MG tablet Take 1 tablet (25 mg total) by mouth 3 (three) times daily as needed for dizziness. 06/16/22   Trinna Post, MD  mirabegron ER (MYRBETRIQ) 50 MG TB24 tablet Take 1 tablet (50 mg total) by mouth daily. 08/20/20   Caro Laroche, DO  olmesartan (BENICAR) 40 MG tablet Take 1 tablet (40 mg total) by mouth daily. 10/21/21 10/21/22  Meredeth Ide, MD  pantoprazole (PROTONIX) 40 MG tablet Take 1 tablet (40 mg total) by mouth daily. 06/20/21   Enedina Finner, MD  polyethylene glycol (MIRALAX / GLYCOLAX) 17 g packet Take 17 g by mouth daily as needed for mild constipation. 10/21/21   Meredeth Ide, MD  rosuvastatin (CRESTOR) 10 MG tablet Take 1 tablet (10 mg total) by mouth daily. 08/14/21   Berniece Salines, [name]  senna-docusate (SENOKOT-S) 8.6-50 MG tablet Take 1 tablet by mouth at bedtime as needed for mild constipation. 10/21/21   Meredeth Ide, MD  WIXELA INHUB 250-50 MCG/DOSE AEPB Inhale 1 puff into the lungs 2 (two) times daily. 01/22/20   [provider]    Physical Exam: Vitals:   08/03/22 1730 08/03/22 1833 08/03/22 1930 08/03/22 1945  BP: (!) 146/101     Pulse: 88   87  Resp: (!) 24  (!) 22   Temp:   98.1 F (36.7 C)    TempSrc:      SpO2: 96%      Genreal - patient has obvious strabismus, does not seem to make eye contact. Does not appear to be distressed. Respiratory exam: Bilateral intravesicular Cardiovascular exam S1-S2 normal Abdomen all quadrant soft nontender Extremities warm without edema, antigravity strength in all 4 extremities, although patient reports left lower extremity weakness that is chronic.    Data Reviewed:  Labs on Admission:  Results for orders placed or performed during the hospital encounter of 08/03/22 (from the past 24 hour(s))  CBG monitoring, ED     Status: Abnormal   Collection Time: 08/03/22  2:04 PM  Result Value Ref Range   Glucose-Capillary 398 (H) 70 - 99 mg/dL  Comprehensive metabolic panel     Status: Abnormal   Collection Time: 08/03/22  2:06 PM  Result Value Ref Range   Sodium 133 (L) 135 - 145 mmol/L   Potassium 3.8 3.5 - 5.1 mmol/L   Chloride 101 98 - 111 mmol/L   CO2 22 22 - 32 mmol/L   Glucose, Bld 435 (H) 70 - 99 mg/dL   BUN 15 6 - 20 mg/dL   Creatinine, Ser 2.95 0.44 - 1.00 mg/dL   Calcium 8.8 (L) 8.9 - 10.3 mg/dL   Total Protein 7.7 6.5 - 8.1 g/dL   Albumin 3.8 3.5 - 5.0 g/dL  AST 16 15 - 41 U/L   ALT 27 0 - 44 U/L   Alkaline Phosphatase 99 38 - 126 U/L   Total Bilirubin 0.5 0.3 - 1.2 mg/dL   GFR, Estimated >57 >84 mL/min   Anion gap 10 5 - 15  CBC with Differential     Status: None   Collection Time: 08/03/22  2:06 PM  Result Value Ref Range   WBC 7.1 4.0 - 10.5 K/uL   RBC 4.68 3.87 - 5.11 MIL/uL   Hemoglobin 13.5 12.0 - 15.0 g/dL   HCT 69.6 29.5 - 28.4 %   MCV 90.4 80.0 - 100.0 fL   MCH 28.8 26.0 - 34.0 pg   MCHC 31.9 30.0 - 36.0 g/dL   RDW 13.2 44.0 - 10.2 %   Platelets 265 150 - 400 K/uL   nRBC 0.0 0.0 - 0.2 %   Neutrophils Relative % 69 %   Neutro Abs 4.8 1.7 - 7.7 K/uL   Lymphocytes Relative 24 %   Lymphs Abs 1.7 0.7 - 4.0 K/uL   Monocytes Relative 7 %   Monocytes Absolute 0.5 0.1 - 1.0 K/uL    Eosinophils Relative 0 %   Eosinophils Absolute 0.0 0.0 - 0.5 K/uL   Basophils Relative 0 %   Basophils Absolute 0.0 0.0 - 0.1 K/uL   Immature Granulocytes 0 %   Abs Immature Granulocytes 0.03 0.00 - 0.07 K/uL  Blood gas, venous     Status: Abnormal   Collection Time: 08/03/22  2:06 PM  Result Value Ref Range   pH, Ven 7.41 7.25 - 7.43   pCO2, Ven 40 (L) 44 - 60 mmHg   pO2, Ven 40 32 - 45 mmHg   Bicarbonate 25.4 20.0 - 28.0 mmol/L   Acid-Base Excess 0.7 0.0 - 2.0 mmol/L   O2 Saturation 66.5 %   Patient temperature 37.0    Collection site VENOUS   Urinalysis, Routine w reflex microscopic -Urine, Clean Catch     Status: Abnormal   Collection Time: 08/03/22  2:06 PM  Result Value Ref Range   Color, Urine STRAW (A) YELLOW   APPearance CLEAR (A) CLEAR   Specific Gravity, Urine 1.011 1.005 - 1.030   pH 5.0 5.0 - 8.0   Glucose, UA >=500 (A) NEGATIVE mg/dL   Hgb urine dipstick NEGATIVE NEGATIVE   Bilirubin Urine NEGATIVE NEGATIVE   Ketones, ur NEGATIVE NEGATIVE mg/dL   Protein, ur NEGATIVE NEGATIVE mg/dL   Nitrite NEGATIVE NEGATIVE   Leukocytes,Ua MODERATE (A) NEGATIVE   RBC / HPF 0-5 0 - 5 RBC/hpf   WBC, UA 11-20 0 - 5 WBC/hpf   Bacteria, UA RARE (A) NONE SEEN   Squamous Epithelial / HPF 0-5 0 - 5 /HPF   Mucus PRESENT    Hyaline Casts, UA PRESENT   Lactic acid, plasma     Status: Abnormal   Collection Time: 08/03/22  2:06 PM  Result Value Ref Range   Lactic Acid, Venous 2.6 (HH) 0.5 - 1.9 mmol/L  Beta-hydroxybutyric acid     Status: None   Collection Time: 08/03/22  2:06 PM  Result Value Ref Range   Beta-Hydroxybutyric Acid 0.19 0.05 - 0.27 mmol/L  CBG monitoring, ED     Status: Abnormal   Collection Time: 08/03/22  5:33 PM  Result Value Ref Range   Glucose-Capillary 216 (H) 70 - 99 mg/dL  Lactic acid, plasma     Status: None   Collection Time: 08/03/22  5:35 PM  Result Value Ref  Range   Lactic Acid, Venous 1.6 0.5 - 1.9 mmol/L   *Note: Due to a large number of results  and/or encounters for the requested time period, some results have not been displayed. A complete set of results can be found in Results Review.   Basic Metabolic Panel: Recent Labs  Lab 08/03/22 1406  NA 133*  K 3.8  CL 101  CO2 22  GLUCOSE 435*  BUN 15  CREATININE 0.95  CALCIUM 8.8*   Liver Function Tests: Recent Labs  Lab 08/03/22 1406  AST 16  ALT 27  ALKPHOS 99  BILITOT 0.5  PROT 7.7  ALBUMIN 3.8   No results for input(s): "LIPASE", "AMYLASE" in the last 168 hours. No results for input(s): "AMMONIA" in the last 168 hours. CBC: Recent Labs  Lab 08/03/22 1406  WBC 7.1  NEUTROABS 4.8  HGB 13.5  HCT 42.3  MCV 90.4  PLT 265   Cardiac Enzymes: No results for input(s): "CKTOTAL", "CKMB", "CKMBINDEX", "TROPONINIHS" in the last 168 hours.  BNP (last 3 results) No results for input(s): "PROBNP" in the last 8760 hours. CBG: Recent Labs  Lab 08/03/22 1404 08/03/22 1733  GLUCAP 398* 216*    Radiological Exams on Admission:  No results found.  EKG: Independently reviewed. NSR   Assessment and Plan: * UTI (urinary tract infection) This has to be interpreted in the context of patient's urine culture from July 19, 2022 which grew E. coli with intermediate sensitivity to ciprofloxacin.  At the time of patient's discharge on July 22, 2022.  Patient was advised to stop taking ciprofloxacin and be treated with Keflex.  Unfortunately patient took ciprofloxacin for approximately 7 days and subsequent to that only started taking Keflex.  Unfortunately patient has failed this treatment and now has ongoing symptoms of dysuria.  With associated leukocytes in the urine.  As well as mild lactic acidosis.  Additional factors for failure in this patient would include her uncontrolled diabetes mellitus with hyperglycemia.  Regardless for thoroughness I will do an ultrasound study of the kidney ureter bladder.  Lactic acidosis has resolved with IV fluids.  Not felt to represent sepsis  at this time and may be due to hyperglycemia/DM itself.[name] Please folow up urine culture sent from ER. Rx with ceftraxone. S.p PO bactrim in ER X 1  Uncontrolled type 2 diabetes mellitus with hyperglycemia (HCC) I intend to continue patient's Lantus as well as insulin sliding scale.  I will put in a routine request in the system for endocrinology consult for further help in controlling this patient's hyperglycemia  Lightheadedness This is a chronic complaint of the patient when patient has hyperglycemia.  At this time I suspect patient was dehydrated at the time of her evaluation at home due to hyperglycemia.  Patient has received IV fluids, hyperglycemia has been better controlled in the hospital.  I will check orthostatic vitals      Advance Care Planning:   Code Status: Full Code   Consults: endocrine as above.  Family Communication: per patient. Daughter has been in touch with patient.  Severity of Illness: The appropriate patient status for this patient is OBSERVATION. Observation status is judged to be reasonable and necessary in order to provide the required intensity of service to ensure the patient's safety. The patient's presenting symptoms, physical exam findings, and initial radiographic and laboratory data in the context of their medical condition is felt to place them at decreased risk for further clinical deterioration. Furthermore, it is anticipated that the  patient will be medically stable for discharge from the hospital within 2 midnights of admission.   Author: Nolberto Hanlon, MD 08/03/2022 7:54 PM  For on call review [email or web address].

## 2022-08-03 NOTE — Assessment & Plan Note (Signed)
>>  ASSESSMENT AND PLAN FOR UNCONTROLLED TYPE 2 DIABETES MELLITUS WITH HYPERGLYCEMIA (HCC) WRITTEN ON 08/03/2022  8:08 PM BY GOEL, HERSH, MD  I intend to continue patient's Lantus  as well as insulin  sliding scale.  I will put in a routine request in the system for endocrinology consult for further help in controlling this patient's hyperglycemia

## 2022-08-03 NOTE — Discharge Instructions (Addendum)
We switched antibiotics given that there is some signs of residual urine infection.  Take these antibiotics for the full course as prescribed.  Talk to your doctor about blood pressure control.  Thank you for choosing [name] for your health care today!  Please see your primary doctor this week for a follow up appointment.   If you have any new, worsening, or unexpected symptoms call your doctor right away or come back to the emergency department for reevaluation.  It was my pleasure to care for you today.

## 2022-08-03 NOTE — Progress Notes (Signed)
PHARMACIST - PHYSICIAN COMMUNICATION  CONCERNING:  Enoxaparin (Lovenox) for DVT Prophylaxis    RECOMMENDATION: Patient was prescribed enoxaprin 40mg  q24 hours for VTE prophylaxis.   Filed Weights   08/03/22 2050  Weight: 113.7 kg (250 lb 11.2 oz)    Body mass index is 40.46 kg/m.  Estimated Creatinine Clearance: 83.6 mL/min (by C-G formula based on SCr of 0.95 mg/dL).   Based on [facility] policy patient is candidate for enoxaparin 0.5mg /kg TBW SQ every 24 hours based on BMI being >30.  DESCRIPTION: Pharmacy has adjusted enoxaparin dose per [facility] policy.  Patient is now receiving enoxaparin 0.5 mg/kg every 24 hours    Elliot Gurney, PharmD, BCPS Clinical Pharmacist  08/03/2022 9:29 PM

## 2022-08-03 NOTE — ED Provider Notes (Addendum)
Signout received.  Blood sugar improved with treatment.  Recent UTI with still some residual evidence of UTI, switched antibiotics Bactrim prescription sent with first dose in the emergency department.  Repeat lactic is pending, plan will be for discharge if downtrending, patient stable.  Lactic normalized. Glucose much improved.  Pt stable.  Very upset when discussing discharge planning. Says UTI is failing oral antibiotics. W evidence of UTI +Lactic (though normalized) will consult w hospitalist regarding benefit of admission. Pt stable.    Pilar Jarvis, MD 08/03/22 1759    Pilar Jarvis, MD 08/03/22 [phone]

## 2022-08-04 ENCOUNTER — Ambulatory Visit: Payer: 59 | Admitting: Family Medicine

## 2022-08-04 ENCOUNTER — Encounter: Payer: Self-pay | Admitting: Internal Medicine

## 2022-08-04 ENCOUNTER — Encounter: Payer: Self-pay | Admitting: Student in an Organized Health Care Education/Training Program

## 2022-08-04 LAB — BASIC METABOLIC PANEL
Anion gap: 12 (ref 5–15)
BUN: 17 mg/dL (ref 6–20)
CO2: 19 mmol/L — ABNORMAL LOW (ref 22–32)
Calcium: 9.2 mg/dL (ref 8.9–10.3)
Chloride: 104 mmol/L (ref 98–111)
Creatinine, Ser: 0.89 mg/dL (ref 0.44–1.00)
GFR, Estimated: >60 mL/min (ref >60)
Glucose, Bld: 374 mg/dL — ABNORMAL HIGH (ref 70–99)
Potassium: 4.9 mmol/L (ref 3.5–5.1)
Sodium: 135 mmol/L (ref 135–145)

## 2022-08-04 LAB — CBC
HCT: 39.5 % (ref 36.0–46.0)
Hemoglobin: 13.2 g/dL (ref 12.0–15.0)
MCH: 28.9 pg (ref 26.0–34.0)
MCHC: 33.4 g/dL (ref 30.0–36.0)
MCV: 86.4 fL (ref 80.0–100.0)
Platelets: 215 10*3/uL (ref 150–400)
RBC: 4.57 MIL/uL (ref 3.87–5.11)
RDW: 13 % (ref 11.5–15.5)
WBC: 7.8 10*3/uL (ref 4.0–10.5)
nRBC: 0 % (ref 0.0–0.2)

## 2022-08-04 LAB — PROTIME-INR
INR: 1.1 (ref 0.8–1.2)
Prothrombin Time: 13.9 seconds (ref 11.4–15.2)

## 2022-08-04 LAB — GLUCOSE, CAPILLARY
Glucose-Capillary: 343 mg/dL — ABNORMAL HIGH (ref 70–99)
Glucose-Capillary: 317 mg/dL — ABNORMAL HIGH (ref 70–99)
Glucose-Capillary: 198 mg/dL — ABNORMAL HIGH (ref 70–99)
Glucose-Capillary: 162 mg/dL — ABNORMAL HIGH (ref 70–99)

## 2022-08-04 LAB — APTT: aPTT: 27 seconds (ref 24–36)

## 2022-08-04 MED ORDER — IBUPROFEN 400 MG PO TABS
400.0000 mg | ORAL_TABLET | Freq: Three times a day (TID) | ORAL | Status: DC | PRN
  Administered 2022-08-05 – 2022-08-08 (×8): 400 mg via ORAL
  Filled 2022-08-04 (×8): qty 1

## 2022-08-04 MED ORDER — IBUPROFEN 400 MG PO TABS
400.0000 mg | ORAL_TABLET | Freq: Four times a day (QID) | ORAL | Status: DC | PRN
  Administered 2022-08-04: 400 mg via ORAL
  Filled 2022-08-04: qty 1

## 2022-08-04 MED ORDER — INSULIN ASPART 100 UNIT/ML IJ SOLN
35.0000 [IU] | Freq: Three times a day (TID) | INTRAMUSCULAR | Status: DC
  Administered 2022-08-04 – 2022-08-08 (×11): 35 [IU] via SUBCUTANEOUS
  Filled 2022-08-04 (×12): qty 1

## 2022-08-04 NOTE — TOC Initial Note (Addendum)
Transition of Care [facility]) - Initial/Assessment Note    Patient Details  Name: [name] MRN: [id number] Date of Birth: 11-29-1965  Transition of Care [facility]) CM/SW Contact:    Margarito Liner, LCSW Phone Number: 08/04/2022, 2:54 PM  Clinical Narrative:  CSW met with patient. She had her daughter on facetime. CSW introduce role and explained that therapy recommendations would be discussed. They are agreeable to home health services. She last worked with [facility] in 2023. CSW asked liaison to review for PT and OT. If unable to find home health, patient said appointments would use up her transportation through [facility] so she would not be able to do that. CSW encouraged her to call her PCP 1-2 weeks after discharge if unable to find home health. DME recommendation for RW. Patient has a rollator at home that she said she got about 4 years ago. CSW asked [employer] to confirm she's not eligible for a RW. Patient also has a quad cane at home but said her daughter bought that for her. No further concerns. CSW encouraged patient to contact CSW as needed. CSW will continue to follow patient for support and facilitate return home once stable. Either son-in-law will pick her up at discharge or daughter will send an Benedetto Goad to pick her up.        3:29 pm: [facility], [facility], and [facility] declined referral. Iantha Fallen accepted for PT and OT.  Expected Discharge Plan: Home w Home Health Services Barriers to Discharge: Continued Medical Work up   Patient Goals and CMS Choice            Expected Discharge Plan and Services     Post Acute Care Choice: Durable Medical Equipment, Home Health Living arrangements for the past 2 months: Single Family Home                                      Prior Living Arrangements/Services Living arrangements for the past 2 months: Single Family Home Lives with:: Adult Children, Relatives Patient language and need for interpreter reviewed:: Yes Do you feel safe  going back to the place where you live?: Yes      Need for Family Participation in Patient Care: Yes (Comment) Care giver support system in place?: Yes (comment) Current home services: DME Criminal Activity/Legal Involvement Pertinent to Current Situation/Hospitalization: No - Comment as needed  Activities of Daily Living Home Assistive Devices/Equipment: CBG Meter, C-collar, CPAP ADL Screening (condition at time of admission) Patient's cognitive ability adequate to safely complete daily activities?: Yes Is the patient deaf or have difficulty hearing?: No Does the patient have difficulty seeing, even when wearing glasses/contacts?: No Does the patient have difficulty concentrating, remembering, or making decisions?: No Patient able to express need for assistance with ADLs?: Yes Does the patient have difficulty dressing or bathing?: No Independently performs ADLs?: Yes (appropriate for developmental age) Does the patient have difficulty walking or climbing stairs?: Yes Weakness of Legs: Both Weakness of Arms/Hands: Both  Permission Sought/Granted Permission sought to share information with : Facility Medical sales representative, Family Supports Permission granted to share information with : Yes, Verbal Permission Granted  Share Information with NAME: Cora Daniels  Permission granted to share info w AGENCY: Home Health Agencies  Permission granted to share info w Relationship: Daughter  Permission granted to share info w Contact Information: [phone]  Emotional Assessment Appearance:: Appears stated age Attitude/Demeanor/Rapport: Engaged, Gracious Affect (typically  observed): Accepting, Appropriate, Calm, Pleasant Orientation: : Oriented to Self, Oriented to Place, Oriented to  Time, Oriented to Situation Alcohol / Substance Use: Not Applicable Psych Involvement: No (comment)  Admission diagnosis:  Lower urinary tract infectious disease [N39.0] UTI (urinary tract infection)  [N39.0] Hyperglycemia [R73.9] Patient Active Problem List   Diagnosis Date Noted   Hyperglycemia 07/20/2022   Uncontrolled type 2 diabetes mellitus with hyperglycemia (HCC) 11/17/2021   Insulin dependent type 2 diabetes mellitus (HCC) 11/17/2021   Acute kidney injury (HCC) 10/15/2021   Mild episode of recurrent major depressive disorder (HCC) 08/14/2021   Hypoglycemia 07/07/2021   UTI (urinary tract infection) 07/07/2021   Syncope 07/07/2021   Diarrhea 06/09/2021   Hyperkalemia 06/09/2021   Intractable nausea and vomiting 06/08/2021   Orthostatic hypotension 04/29/2021   Constipation 04/28/2021   Hypertensive urgency 04/23/2021   GERD without esophagitis 04/23/2021   AKI (acute kidney injury) (HCC) 04/23/2021   Dyslipidemia 04/23/2021   Chronic diastolic CHF (congestive heart failure) (HCC) 04/23/2021   Cervical spondylosis 02/12/2021   Lumbar facet arthropathy 02/12/2021   Major depressive disorder, recurrent episode, moderate (HCC) 11/13/2020   Adrenal adenoma, right    PAD (peripheral artery disease) (HCC) 01/24/2020   Abnormal ankle brachial index (ABI) 12/18/2019   CAD (coronary artery disease) 04/27/2019   Diabetic polyneuropathy associated with type 2 diabetes mellitus (HCC) 03/02/2018   Vitamin D deficiency 01/24/2018   Overactive bladder 05/18/2017   Dilated aortic root (HCC)    Sacroiliac dysfunction 10/09/2014   Chronic low back pain 09/03/2014   Hemiparesis affecting left side as late effect of cerebrovascular accident (CVA) (HCC) 09/03/2014   OSA (obstructive sleep apnea) 05/15/2014   Dyslipidemia associated with type 2 diabetes mellitus (HCC) 01/21/2014   Essential hypertension, benign 01/21/2014   Chronic pain syndrome 01/21/2014   Headache disorder 09/07/2013   Mixed urge and stress incontinence 08/25/2011   Ureteral stricture, right 08/25/2011   Nephrolithiasis 08/22/2011   Incisional hernia 05/10/2011   Lightheadedness 10/12/2006   BLINDNESS, LEGAL, [place]  DEFINITION 09/14/2006   Seizure (HCC) 09/13/2006   PCP:  [name], [name] Pharmacy:   [facility]) - [place], [place] - [address] [place] [address] [place] [address] Phone: [phone] Fax: [phone]     Social Determinants of Health (SDOH) Social History: SDOH Screenings   Food Insecurity: No Food Insecurity (08/03/2022)  Recent Concern: Food Insecurity - Food Insecurity Present (06/28/2022)  Housing: Patient Declined (08/03/2022)  Transportation Needs: No Transportation Needs (08/03/2022)  Utilities: Not At Risk (08/03/2022)  Alcohol Screen: Low Risk  (03/26/2022)  Depression (PHQ2-9): Low Risk  (06/29/2022)  Financial Resource Strain: Medium Risk (06/28/2022)  Physical Activity: Insufficiently Active (06/28/2022)  Social Connections: Moderately Integrated (06/28/2022)  Stress: No Stress Concern Present (06/28/2022)  Tobacco Use: Medium Risk (08/04/2022)   SDOH Interventions:     Readmission Risk Interventions    06/11/2021   11:43 AM 05/19/2021   12:02 PM 04/24/2021    3:02 PM  Readmission Risk Prevention Plan  Transportation Screening Complete Complete Complete  PCP or Specialist Appt within 5-7 Days  Complete   Home Care Screening  Complete   Medication Review (RN CM)  Complete   HRI or Home Care Consult   Complete  Social Work Consult for Recovery Care Planning/Counseling   --  Palliative Care Screening   Not Applicable  Medication Review Oceanographer) Complete  Complete  PCP or Specialist appointment within 3-5 days of discharge  Complete    HRI or Home Care Consult Complete    SW Recovery Care/Counseling Consult Complete    Palliative Care Screening Not Applicable    Skilled Nursing Facility Not Applicable

## 2022-08-04 NOTE — Evaluation (Addendum)
Physical Therapy Evaluation Patient Details Name: [name] MRN: [id number] DOB: [birth year] Today's Date: 08/04/2022  History of Present Illness  Pt is a 57 y/o female for a UTI and generalized weakness. PMH includes COPD, DM, HTN, GAD, UTI, OSA on CPAP, CVA, CKS, diastolic CHF, HLD, seizures, L eye blindness. She was recently d/c on 7/3 for a UTI.  Clinical Impression   Pt presents in bed, currently 8/10 pain in abdominal and low back region. She currently lives in a 1 story home with her daughter and grandchildren with 1 step to enter with no rail. PTA she used a quad cane at home and a rollator in the community, but had begun to use the rollator more at home as she started to feel weaker. Her daughter and grandchildren have been able to assist her as needed.   Orthostatics were assessed due to continued complaints of dizziness/lightheadedness, but results were unremarkable. Pt was able to tolerate standing ~3-4 min and ambulate ~63ft in room with CGA and RW for safety. She was limited by dizziness/ lightheadedness throughout today's session. Pt would benefit from continued skilled PT to address current weakness and decreased activity tolerance.       Assistance Recommended at Discharge PRN  If plan is discharge home, recommend the following:  Can travel by private vehicle  Assist for transportation;Help with stairs or ramp for entrance;A little help with bathing/dressing/bathroom;Assistance with cooking/housework;A little help with walking and/or transfers;Direct supervision/assist for medications management        Equipment Recommendations Rolling walker (2 wheels)  Recommendations for Other Services       Functional Status Assessment Patient has had a recent decline in their functional status and demonstrates the ability to make significant improvements in function in a reasonable and predictable amount of time.     Precautions / Restrictions Precautions Precautions:  Fall Precaution Comments: Continued complaints of lightheadedness and dizziness with mobiltiy Restrictions Weight Bearing Restrictions: No      Mobility  Bed Mobility Overal bed mobility: Needs Assistance Bed Mobility: Supine to Sit     Supine to sit: Modified independent (Device/Increase time)     General bed mobility comments: increased time    Transfers Overall transfer level: Needs assistance Equipment used: Rolling walker (2 wheels) Transfers: Sit to/from Stand Sit to Stand: Supervision                Ambulation/Gait Ambulation/Gait assistance: Min guard Gait Distance (Feet): 25 Feet Assistive device: Rolling walker (2 wheels)         General Gait Details: slowed gait speed, lightheaded/dizziness during ambulation  Stairs            Wheelchair Mobility     Tilt Bed    Modified [place] (Stroke Patients Only)       Balance Overall balance assessment: Needs assistance Sitting-balance support: Feet supported, No upper extremity supported Sitting balance-Leahy Scale: Good     Standing balance support: Bilateral upper extremity supported, During functional activity, Reliant on assistive device for balance Standing balance-Leahy Scale: Poor Standing balance comment: Current dizziness/ lightheadedness limiting standing balance                             Pertinent Vitals/Pain Pain Assessment Pain Assessment: 0-10 Pain Score: 8  Pain Location: abdominal/ low back region Pain Descriptors / Indicators: Discomfort, Constant Pain Intervention(s): Monitored during session, Repositioned (MD came in room during tx, Pt discussed medications for pain with  them briefly)    Home Living Family/patient expects to be discharged to:: Private residence Living Arrangements: Other (Comment);Children (Daughter) Available Help at Discharge: Family Type of Home: House Home Access: Stairs to enter Entrance Stairs-Rails: None Entrance Stairs-Number  of Steps: 1   Home Layout: One level Home Equipment: Architectural technologist (4 wheels)      Prior Function Prior Level of Function : Needs assist       Physical Assist : Mobility (physical) Mobility (physical): Gait;Stairs;Transfers   Mobility Comments: Quad cane for in home, rollator for community. Has been occasionally using rollator at home due to onset of weakness ADLs Comments: PRN assist from grandchildren/daughter, has been taking sponge baths     Hand Dominance        Extremity/Trunk Assessment   Upper Extremity Assessment Upper Extremity Assessment: Generalized weakness    Lower Extremity Assessment Lower Extremity Assessment: Generalized weakness       Communication   Communication: No difficulties  Cognition Arousal/Alertness: Awake/alert Behavior During Therapy: WFL for tasks assessed/performed Overall Cognitive Status: Within Functional Limits for tasks assessed                                          General Comments General comments (skin integrity, edema, etc.): Lightheaded/dizziness noted throughout treatment    Exercises     Assessment/Plan    PT Assessment Patient needs continued PT services  PT Problem List Decreased strength;Decreased range of motion;Decreased activity tolerance;Decreased balance;Decreased mobility;Pain;Decreased knowledge of use of DME       PT Treatment Interventions DME instruction;Gait training;Stair training;Functional mobility training;Therapeutic activities;Therapeutic exercise;Patient/family education;Balance training    PT Goals (Current goals can be found in the Care Plan section)  Acute Rehab PT Goals Patient Stated Goal: Return home PT Goal Formulation: With patient Time For Goal Achievement: 08/06/22 Potential to Achieve Goals: Good (Pending control of glucose levels)    Frequency Min 1X/week     Co-evaluation               AM-PAC PT "6 Clicks" Mobility  Outcome Measure Help  needed turning from your back to your side while in a flat bed without using bedrails?: None Help needed moving from lying on your back to sitting on the side of a flat bed without using bedrails?: A Little Help needed moving to and from a bed to a chair (including a wheelchair)?: A Little Help needed standing up from a chair using your arms (e.g., wheelchair or bedside chair)?: A Little Help needed to walk in hospital room?: A Little Help needed climbing 3-5 steps with a railing? : A Little 6 Click Score: 19    End of Session Equipment Utilized During Treatment: Gait belt Activity Tolerance: Patient tolerated treatment well;Other (comment);Patient limited by fatigue (Pt limited by dizziness/ lightheadedness) Patient left: in chair;with call bell/phone within reach;with chair alarm set   PT Visit Diagnosis: Dizziness and giddiness (R42);Difficulty in walking, not elsewhere classified (R26.2);Muscle weakness (generalized) (M62.81);Unsteadiness on feet (R26.81)    Time: 8295-6213 PT Time Calculation (min) (ACUTE ONLY): 27 min   Charges:     PT Treatments $Therapeutic Activity: 23-37 mins PT General Charges $$ ACUTE PT VISIT: 1 Visit         Naesha Buckalew, PT, SPT 11:48 AM,08/04/22

## 2022-08-04 NOTE — Progress Notes (Signed)
[facility]  - [place] [facility]   PATIENT NAME: [name]    MR#:  [id number]  DATE OF BIRTH:  Aug 13, 1965  SUBJECTIVE:  complains of weakness and lightheadedness earlier. Work with PT. Had some back pain asking for opiates. Not on any chronic pain meds at home. No family at bedside. No fever.    VITALS:  Blood pressure (!) 170/92, pulse 92, temperature 99 F (37.2 C), temperature source Oral, resp. rate 18, height 5\' 6"  (1.676 m), weight 107.5 kg, SpO2 98%.  PHYSICAL EXAMINATION:   GENERAL:  57 y.o.-year-old patient with no acute distress. Morbidly obese LUNGS: Normal breath sounds bilaterally, no wheezing CARDIOVASCULAR: S1, S2 normal. No murmur   ABDOMEN: Soft, nontender, nondistended. Bowel sounds present.  EXTREMITIES: +  edema b/l.    NEUROLOGIC: nonfocal  patient is alert and awake SKIN: No obvious rash, lesion, or ulcer.   LABORATORY PANEL:  CBC Recent Labs  Lab 08/04/22 0641  WBC 7.8  HGB 13.2  HCT 39.5  PLT 215    Chemistries  Recent Labs  Lab 08/03/22 1406 08/04/22 0641  NA 133* 135  K 3.8 4.9  CL 101 104  CO2 22 19*  GLUCOSE 435* 374*  BUN 15 17  CREATININE 0.95 0.89  CALCIUM 8.8* 9.2  AST 16  --   ALT 27  --   ALKPHOS 99  --   BILITOT 0.5  --    Cardiac Enzymes No results for input(s): "TROPONINI" in the last 168 hours. RADIOLOGY:  US RENAL  Result Date: 08/03/2022 CLINICAL DATA:  [id number] UTI (urinary tract infection) [id number] EXAM: RENAL / URINARY TRACT ULTRASOUND COMPLETE COMPARISON:  CT renal 10/15/2021 FINDINGS: Right Kidney: Renal measurements: 10.5 x 5 x 4.9 cm = volume: 135 mL. Echogenicity within normal limits. No mass or hydronephrosis visualized. Left Kidney: Renal measurements: 12 x 7.8 x 5.2 cm = volume: 251 mL. Echogenicity within normal limits. No mass or hydronephrosis visualized. Urinary bladder: Appears normal for degree of bladder distention. Other: None. IMPRESSION: Unremarkable renal ultrasound.  Electronically Signed   By: Tish Frederickson M.D.   On: 08/03/2022 20:47    Assessment and Plan   [name] is a 57 y.o. female with medical history significant of Past medical history type 2 diabetes, COPD, prior CVA (left sided deficits), CKD, hypertension, diastolic CHF, hyperlipidemia, seizures,  aortic root dilation, left eye blindness who arrived via EMS from home for generalized weakness and hyperglycemia. Patient was recently admitted and of July June and just finished course of Keflex. Urine looks abnormal started on IV Rocephin in the ER.  Recurrent UTI -- patient just finished course in June 2024. Had E. coli. Finished course of Keflex -- now on IV Rocephin. Follow-up cultures and change to oral accordingly -- patient advised to hydrate appropriately  Type II diabetes, uncontrolled, hyperglycemia -- patient on long-acting and short-acting insulin. I've adjusted the dosage. -- Patient advised lifestyle with diet compliance and exercise  Generalized weakness -- seen by PT OT recommends home health will arrange.  Hypertension -- continue home meds  Patient has multiple other medical problems which remains stable  If continues to remain stable discharge on PO antibiotics tomorrow.    Procedures: Family communication :none Consults :none CODE STATUS: full DVT Prophylaxis :lovenox Level of care: Med-Surg Status is: Observation The patient remains OBS appropriate and will d/c before 2 midnights.    TOTAL TIME TAKING CARE OF THIS PATIENT: 35 minutes.  >50% time spent on  counselling and coordination of care  Note: This dictation was prepared with Dragon dictation along with smaller phrase technology. Any transcriptional errors that result from this process are unintentional.  Enedina Finner M.D    [facility]   CC: Primary care physician; [name], [name]

## 2022-08-04 NOTE — Evaluation (Signed)
Occupational Therapy Evaluation Patient Details Name: [name] MRN: [id number] DOB: 1965-12-14 Today's Date: 08/04/2022   History of Present Illness Pt is a 57 y/o female who presented to the ED with UTI and generalized weakness. PMH includes COPD, DM, HTN, GAD, UTI, OSA on CPAP, CVA, CKS, diastolic CHF, HLD, seizures, L eye blindness. She was recently d/c on 7/3 for a UTI.   Clinical Impression   [name] was seen for OT evaluation this date. Prior to hospital admission, pt required some assistance from family members for BADL management including bathing and dressing. She reports using a BSC for most toileting needs and a QC for household mobility. Pt lives adult daughter and grandchildren. Pt presents to acute OT demonstrating impaired ADL performance and functional mobility 2/2 generalized weakness, decreased activity tolerance, increased pain with mobility, and decreased safety awareness (See OT problem list for additional functional deficits). Pt currently requires SUPERVISION for safety with toilet transfers/toileting and MIN A for exertional LB ADL management.  Pt would benefit from skilled OT services to address noted impairments and functional limitations (see below for any additional details) in order to maximize safety and independence while minimizing falls risk and caregiver burden. Anticipate the need for follow up OT services in the home setting upon acute hospital DC.       Recommendations for follow up therapy are one component of a multi-disciplinary discharge planning process, led by the attending physician.  Recommendations may be updated based on patient status, additional functional criteria and insurance authorization.   Assistance Recommended at Discharge Intermittent Supervision/Assistance  Patient can return home with the following A little help with walking and/or transfers;A little help with bathing/dressing/bathroom;Assist for transportation;Assistance with  cooking/housework;Help with stairs or ramp for entrance    Functional Status Assessment  Patient has had a recent decline in their functional status and demonstrates the ability to make significant improvements in function in a reasonable and predictable amount of time.  Equipment Recommendations  None recommended by OT    Recommendations for Other Services       Precautions / Restrictions Precautions Precautions: Fall Precaution Comments: Continued complaints of lightheadedness and dizziness with mobiltiy Restrictions Weight Bearing Restrictions: No      Mobility Bed Mobility Overal bed mobility: Needs Assistance Bed Mobility: Sit to Supine       Sit to supine: Supervision   General bed mobility comments: increased time    Transfers Overall transfer level: Needs assistance Equipment used: Rolling walker (2 wheels) Transfers: Sit to/from Stand Sit to Stand: Supervision                  Balance Overall balance assessment: Needs assistance Sitting-balance support: Feet supported, No upper extremity supported Sitting balance-Leahy Scale: Good Sitting balance - Comments: steady static sitting, reaching outside BOS.   Standing balance support: Reliant on assistive device for balance, During functional activity Standing balance-Leahy Scale: Fair Standing balance comment: steady with BUE support on RW during toilet transfer.                           ADL either performed or assessed with clinical judgement   ADL Overall ADL's : Needs assistance/impaired     Grooming: Standing;Wash/dry hands;Supervision/safety                   Toilet Transfer: Supervision/safety;Rolling walker (2 wheels);Ambulation   Toileting- Clothing Manipulation and Hygiene: Supervision/safety;Set up;Sitting/lateral lean  Functional mobility during ADLs: Supervision/safety;Rolling walker (2 wheels) General ADL Comments: Anticipate MIN A for more exertional ADL  management including LB bathing and dressing. SUPERVISION For safety with RW during tiolet transfer and functional mobility in room. Fatigues quickly. Noted decreased safety awareness. Discussed falls prevention strategies including use of safe footwear for mobility and monitoring environment for falls hazards. Would benefit from further reinforcement.     Vision Patient Visual Report: No change from baseline       Perception     Praxis      Pertinent Vitals/Pain Pain Assessment Pain Assessment: 0-10 Pain Score: 8  Pain Location: abdominal/ low back region Pain Descriptors / Indicators: Discomfort, Constant, Sore Pain Intervention(s): Limited activity within patient's tolerance, Monitored during session, Patient requesting pain meds-RN notified     Hand Dominance Right   Extremity/Trunk Assessment Upper Extremity Assessment Upper Extremity Assessment: Generalized weakness RUE Deficits / Details: grossly 4+/5 LUE Deficits / Details: grossly 4-/5, decr sensation 2/2 neuropathy and CVA LUE Sensation: history of peripheral neuropathy;decreased light touch   Lower Extremity Assessment Lower Extremity Assessment: Generalized weakness       Communication Communication Communication: No difficulties   Cognition Arousal/Alertness: Awake/alert Behavior During Therapy: WFL for tasks assessed/performed Overall Cognitive Status: Within Functional Limits for tasks assessed                                       General Comments       Exercises Other Exercises Other Exercises: Pt educated on role of OT in acute setting, safety, falls prevention strategies, with extensive time taken to discuss DC recs including importance of HH therapy services to maximize safety and minimize risk of functional decline and re-admission.   Shoulder Instructions      Home Living Family/patient expects to be discharged to:: Private residence Living Arrangements: Other  (Comment);Children (Daughter, SIL, grandchildren) Available Help at Discharge: Family;Available PRN/intermittently Type of Home: House Home Access: Stairs to enter [employer] of Steps: 1 Entrance Stairs-Rails: None Home Layout: One level     Bathroom Shower/Tub: Chief Strategy Officer: Standard     Home Equipment: Architectural technologist (4 wheels);BSC/3in1          Prior Functioning/Environment Prior Level of Function : Needs assist       Physical Assist : Mobility (physical);ADLs (physical) Mobility (physical): Gait;Stairs;Transfers   Mobility Comments: Quad cane for in home, rollator for community. Has been occasionally using rollator at home due to onset of weakness ADLs Comments: PRN assist from grandchildren/daughter, has been taking sponge baths, keeps BSC in room for most toileting needs.        OT Problem List: Decreased strength;Decreased activity tolerance;Impaired balance (sitting and/or standing);Decreased knowledge of use of DME or AE;Pain;Decreased safety awareness      OT Treatment/Interventions: Self-care/ADL training;Therapeutic exercise;Therapeutic activities;DME and/or AE instruction;Patient/family education;Balance training    OT Goals(Current goals can be found in the care plan section) Acute Rehab OT Goals Patient Stated Goal: To be able to live independently OT Goal Formulation: With patient Time For Goal Achievement: 08/18/22 Potential to Achieve Goals: Good  OT Frequency: Min 1X/week    Co-evaluation              AM-PAC OT "6 Clicks" Daily Activity     Outcome Measure Help from another person eating meals?: None Help from another person taking care of personal grooming?: A  Little Help from another person toileting, which includes using toliet, bedpan, or urinal?: A Little Help from another person bathing (including washing, rinsing, drying)?: A Little Help from another person to put on and taking off regular upper  body clothing?: A Little Help from another person to put on and taking off regular lower body clothing?: A Little 6 Click Score: 19   End of Session Equipment Utilized During Treatment: Rolling walker (2 wheels) Nurse Communication: Patient requests pain meds  Activity Tolerance: Patient tolerated treatment well Patient left: in bed;with call bell/phone within reach;with bed alarm set  OT Visit Diagnosis: Other abnormalities of gait and mobility (R26.89);Muscle weakness (generalized) (M62.81)                Time: 6578-4696 OT Time Calculation (min): 23 min Charges:  OT General Charges $OT Visit: 1 Visit OT Evaluation $OT Eval Low Complexity: 1 Low OT Treatments $Self Care/Home Management : 8-22 mins  Rockney Ghee, M.S., OTR/L 08/04/22, 3:21 PM

## 2022-08-04 NOTE — Care Management Obs Status (Signed)
MEDICARE OBSERVATION STATUS NOTIFICATION   Patient Details  Name: [name] MRN: [id number] Date of Birth: January 30, 1965   Medicare Observation Status Notification Given:  Yes    Margarito Liner, LCSW 08/04/2022, 2:52 PM

## 2022-08-05 DIAGNOSIS — N390 Urinary tract infection, site not specified: Secondary | ICD-10-CM | POA: Diagnosis not present

## 2022-08-05 DIAGNOSIS — R739 Hyperglycemia, unspecified: Secondary | ICD-10-CM | POA: Diagnosis not present

## 2022-08-05 LAB — URINE CULTURE

## 2022-08-05 LAB — GLUCOSE, CAPILLARY
Glucose-Capillary: 316 mg/dL — ABNORMAL HIGH (ref 70–99)
Glucose-Capillary: 323 mg/dL — ABNORMAL HIGH (ref 70–99)
Glucose-Capillary: 165 mg/dL — ABNORMAL HIGH (ref 70–99)
Glucose-Capillary: 141 mg/dL — ABNORMAL HIGH (ref 70–99)

## 2022-08-05 MED ORDER — FAMOTIDINE 20 MG PO TABS
20.0000 mg | ORAL_TABLET | Freq: Every day | ORAL | Status: AC
  Administered 2022-08-05 – 2022-08-07 (×3): 20 mg via ORAL
  Filled 2022-08-05 (×3): qty 1

## 2022-08-05 MED ORDER — SENNA 8.6 MG PO TABS
1.0000 | ORAL_TABLET | Freq: Every day | ORAL | Status: DC
  Administered 2022-08-05 – 2022-08-10 (×6): 8.6 mg via ORAL
  Filled 2022-08-05 (×6): qty 1

## 2022-08-05 MED ORDER — POLYETHYLENE GLYCOL 3350 17 G PO PACK
17.0000 g | PACK | Freq: Two times a day (BID) | ORAL | Status: DC
  Administered 2022-08-05 – 2022-08-09 (×6): 17 g via ORAL
  Filled 2022-08-05 (×11): qty 1

## 2022-08-05 MED ORDER — SORBITOL 70 % SOLN
960.0000 mL | TOPICAL_OIL | Freq: Once | ORAL | Status: AC | PRN
  Administered 2022-08-05: 960 mL via RECTAL
  Filled 2022-08-05: qty 240

## 2022-08-05 MED ORDER — ONDANSETRON HCL 4 MG/2ML IJ SOLN
4.0000 mg | Freq: Four times a day (QID) | INTRAMUSCULAR | Status: DC | PRN
  Administered 2022-08-05 – 2022-08-09 (×6): 4 mg via INTRAVENOUS
  Filled 2022-08-05 (×7): qty 2

## 2022-08-05 NOTE — TOC Progression Note (Signed)
Transition of Care [facility]) - Progression Note    Patient Details  Name: [name] MRN: [id number] Date of Birth: 1965-07-02  Transition of Care [facility]) CM/SW Contact  Margarito Liner, LCSW Phone Number: 08/05/2022, 9:55 AM  Clinical Narrative:   CSW notified patient that Iantha Fallen had accepted her home health referral and that RW was ordered through [employer] this morning.  Expected Discharge Plan: Home w Home Health Services Barriers to Discharge: Continued Medical Work up  Expected Discharge Plan and Services     Post Acute Care Choice: Durable Medical Equipment, Home Health Living arrangements for the past 2 months: Single Family Home                                       Social Determinants of Health (SDOH) Interventions SDOH Screenings   Food Insecurity: No Food Insecurity (08/03/2022)  Recent Concern: Food Insecurity - Food Insecurity Present (06/28/2022)  Housing: Patient Declined (08/03/2022)  Transportation Needs: No Transportation Needs (08/03/2022)  Utilities: Not At Risk (08/03/2022)  Alcohol Screen: Low Risk  (03/26/2022)  Depression (PHQ2-9): Low Risk  (06/29/2022)  Financial Resource Strain: Medium Risk (06/28/2022)  Physical Activity: Insufficiently Active (06/28/2022)  Social Connections: Moderately Integrated (06/28/2022)  Stress: No Stress Concern Present (06/28/2022)  Tobacco Use: Medium Risk (08/04/2022)    Readmission Risk Interventions    06/11/2021   11:43 AM 05/19/2021   12:02 PM 04/24/2021    3:02 PM  Readmission Risk Prevention Plan  Transportation Screening Complete Complete Complete  PCP or Specialist Appt within 5-7 Days  Complete   Home Care Screening  Complete   Medication Review (RN CM)  Complete   HRI or Home Care Consult   Complete  Social Work Consult for Recovery Care Planning/Counseling   --  Palliative Care Screening   Not Applicable  Medication Review Oceanographer) Complete  Complete  PCP or Specialist appointment within 3-5 days  of discharge Complete    HRI or Home Care Consult Complete    SW Recovery Care/Counseling Consult Complete    Palliative Care Screening Not Applicable    Skilled Nursing Facility Not Applicable

## 2022-08-05 NOTE — Inpatient Diabetes Management (Signed)
Inpatient Diabetes Program Recommendations  AACE/ADA: New Consensus Statement on Inpatient Glycemic Control (2015)  Target Ranges:  Prepandial:   less than 140 mg/dL      Peak postprandial:   less than 180 mg/dL (1-2 hours)      Critically ill patients:  140 - 180 mg/dL   Lab Results  Component Value Date   GLUCAP 316 (H) 08/05/2022   HGBA1C 9.7 (H) 07/20/2022    Review of Glycemic Control  Latest Reference Range & Units 08/04/22 08:24 08/04/22 10:49 08/04/22 16:30 08/04/22 21:25 08/05/22 07:48  Glucose-Capillary 70 - 99 mg/dL 161 (H)  Novolog 41 units 317 (H)  Novolog 41 units 198 (H)  Novolog 38 units  162 (H) 316 (H)  Novolog 46 units   (H): Data is abnormally high  Diabetes history: DM2 Outpatient Diabetes medications:  Toujeo 70 units at bedtime Novolog up to 30 units TID Trulicity?  Current orders for Inpatient glycemic control:  Semglee 70 units at bedtime Novolog 0-15 units TID and 0-5 units at bedtime Novolog 35 units TID with meals  Inpatient Diabetes Program Recommendations:    She has received a total of 166 units of rapid insulin in the last 24 hrs.    Please consider increasing Semglee to 75 units at bedtime  Spoke with patient at bedside.  She did not eat prior to fasting glucose of 316 mg/dL.  She states her blood glucose has been elevated in the mornings > 300 mg/dL at home.  She does not drink beverages with sugar.  She checks her glucose at least 4 x a day.  She is current with her PCP.  See's Endocrinology-Dr. Lucianne Muss.  Next visit is scheduled in August.    Will continue to follow while inpatient.  Thank you, Dulce Sellar, MSN, CDCES Diabetes Coordinator Inpatient Diabetes Program [phone] (team pager from 8a-5p)

## 2022-08-05 NOTE — Progress Notes (Signed)
PROGRESS NOTE  [name]    DOB: June 27, 1965, 57 y.o.  NGE:[id number]    Code Status: Full Code   DOA: 08/03/2022   LOS: 0   Brief hospital course  [name] is a 57 y.o. female with a PMH significant for DM, COPD, prior CVA (left sided deficits), CKD, hypertension, diastolic CHF, hyperlipidemia, seizures, aortic root dilation, left eye blindness. Of note, recently discharged from [name] on July 22, 2022 after treatment for urinary tract infection.  Review of patient culture data in the system shows patient was cultured for urine on July 19, 2022.  It grew E. coli and it was not sensitive to ciprofloxacin. She states that she completed this antibiotic at home prior to this admission  They presented from home to the ED on 08/03/2022 with hyperglycemia and generalized weakness x 1 days. Also endorses dysuria since last admission.  In the ED, it was found that they had stable vitals.  Significant findings included lactic acidosis, hyperglycemia, improved with hydration.  They were initially treated with analgesia, home medications, antibiotic, and insulin as well as IVF.   Patient was admitted to medicine service for further workup and management of hyperglycemia and UTI as outlined in detail below.  08/05/22 -improving  Assessment & Plan  Principal Problem:   UTI (urinary tract infection) Active Problems:   Lightheadedness   Uncontrolled type 2 diabetes mellitus with hyperglycemia (HCC)  Recurrent UTI- UxCx this admission resulted in multiple species so attempting to recollect. Has been on CTX for >24 hours so may not get a proper sample to guide deescalation.    Resistant Type II diabetes- patient on high doses long-acting and short-acting insulin.  - continue increasingly higher insulin dose as well as oral agents for better diabetes control - diet education needed -- Patient advised lifestyle with diet compliance and exercise   Generalized weakness --  seen by PT OT recommends home health will arrange.   Hypertension -- continue home meds   Patient has multiple other medical problems which remains stable   Body mass index is 39.07 kg/m.  VTE ppx: SCDs Start: 08/03/22 2029  Diet:     Diet   Diet Carb Modified Fluid consistency: Thin; Room service appropriate? Yes   Consultants: None   Subjective 08/05/22    Pt reports feeling unwell today. Describes nausea and overall malaise type symptoms.    Objective   Vitals:   08/04/22 2250 08/05/22 0353 08/05/22 0500 08/05/22 0746  BP:  114/72  113/77  Pulse: 81 62  94  Resp:  16  16  Temp:  98 F (36.7 C)  98.4 F (36.9 C)  TempSrc:    Oral  SpO2: 96% 94%  96%  Weight:   109.8 kg   Height:        Intake/Output Summary (Last 24 hours) at 08/05/2022 0801 Last data filed at 08/04/2022 1938 Gross per 24 hour  Intake 740 ml  Output --  Net 740 ml   Filed Weights   08/04/22 0933 08/04/22 0937 08/05/22 0500  Weight: 107.7 kg 107.5 kg 109.8 kg    Physical Exam:  General: awake, alert, NAD HEENT: atraumatic, clear conjunctiva, anicteric sclera, MMM, hearing grossly normal Respiratory: normal respiratory effort. Cardiovascular: quick capillary refill Nervous: A&O x3. no gross focal neurologic deficits, normal speech Extremities: moves all equally, no edema, normal tone Skin: dry, intact, normal temperature, normal color. No rashes, lesions or ulcers on exposed skin Psychiatry: normal mood, congruent affect  Labs   I have personally reviewed the following labs and imaging studies CBC    Component Value Date/Time   WBC 7.8 08/04/2022 0641   RBC 4.57 08/04/2022 0641   HGB 13.2 08/04/2022 0641   HGB 13.1 05/08/2015 1106   HCT 39.5 08/04/2022 0641   HCT 39.5 05/08/2015 1106   PLT 215 08/04/2022 0641   PLT 274 05/08/2015 1106   MCV 86.4 08/04/2022 0641   MCV 91 05/08/2015 1106   MCH 28.9 08/04/2022 0641   MCHC 33.4 08/04/2022 0641   RDW 13.0 08/04/2022 0641   RDW  14.1 05/08/2015 1106   LYMPHSABS 1.7 08/03/2022 1406   LYMPHSABS 1.7 05/08/2015 1106   MONOABS 0.5 08/03/2022 1406   EOSABS 0.0 08/03/2022 1406   EOSABS 0.0 05/08/2015 1106   BASOSABS 0.0 08/03/2022 1406   BASOSABS 0.0 05/08/2015 1106      Latest Ref Rng & Units 08/04/2022    6:41 AM 08/03/2022    2:06 PM 07/22/2022    5:32 AM  BMP  Glucose 70 - 99 mg/dL 098  119  147   BUN 6 - 20 mg/dL 17  15  20    Creatinine 0.44 - 1.00 mg/dL 8.29  5.62  1.30   Sodium 135 - 145 mmol/L 135  133  136   Potassium 3.5 - 5.1 mmol/L 4.9  3.8  4.3   Chloride 98 - 111 mmol/L 104  101  105   CO2 22 - 32 mmol/L 19  22  25    Calcium 8.9 - 10.3 mg/dL 9.2  8.8  8.5     US RENAL  Result Date: 08/03/2022 CLINICAL DATA:  [id number] UTI (urinary tract infection) [id number] EXAM: RENAL / URINARY TRACT ULTRASOUND COMPLETE COMPARISON:  CT renal 10/15/2021 FINDINGS: Right Kidney: Renal measurements: 10.5 x 5 x 4.9 cm = volume: 135 mL. Echogenicity within normal limits. No mass or hydronephrosis visualized. Left Kidney: Renal measurements: 12 x 7.8 x 5.2 cm = volume: 251 mL. Echogenicity within normal limits. No mass or hydronephrosis visualized. Urinary bladder: Appears normal for degree of bladder distention. Other: None. IMPRESSION: Unremarkable renal ultrasound. Electronically Signed   By: Tish Frederickson M.D.   On: 08/03/2022 20:47    Disposition Plan & Communication  Patient status: Observation  Admitted From: Home Planned disposition location: Home health Anticipated discharge date: 7/18 pending clinical improvement and further uxcx results  Family Communication: none at bedside    Author: Leeroy Bock, DO [facility] 08/05/2022, 8:01 AM   Available by Epic secure chat 7AM-7PM. If 7PM-7AM, please contact night-coverage.  [facility] contact information found on [email or web address].

## 2022-08-05 NOTE — Progress Notes (Addendum)
Physical Therapy Treatment Patient Details Name: [name] MRN: [id number] DOB: 1965-03-30 Today's Date: 08/05/2022   History of Present Illness Pt is a 57 y/o female who presented to the ED with UTI and generalized weakness. PMH includes COPD, DM, HTN, GAD, UTI, OSA on CPAP, CVA, CKS, diastolic CHF, HLD, seizures, L eye blindness. She was recently d/c on 7/3 for a UTI.    PT Comments  Pt presents laying in bed with nursing in the room, new onset of nausea since last PT visit. Pt was able to perform supine> sit and remain at EOB with supervision for safety. Sit>stand transfers with RW ranged from CGA- minA throughout treatment. Pt was able to ambulate ~48ft today with RW and CGA, requiring 1 rest break ~89ft. Pt able to participate, but generalized weakness and dizziness/nausea continues to limit tolerance to activity. Pt would benefit from continued skilled therapy to address current functional limitations.     Assistance Recommended at Discharge PRN  If plan is discharge home, recommend the following:  Can travel by private vehicle    Assist for transportation;Help with stairs or ramp for entrance;A little help with bathing/dressing/bathroom;Assistance with cooking/housework;A little help with walking and/or transfers;Direct supervision/assist for medications management      Equipment Recommendations  Rolling walker (2 wheels)    Recommendations for Other Services       Precautions / Restrictions Precautions Precautions: Fall Precaution Comments: Lightheaded/dizziness still present, new onset nausea Restrictions Weight Bearing Restrictions: No     Mobility  Bed Mobility Overal bed mobility: Needs Assistance Bed Mobility: Sit to Supine     Supine to sit: Modified independent (Device/Increase time)     General bed mobility comments: increased time    Transfers Overall transfer level: Needs assistance Equipment used: Rolling walker (2 wheels) Transfers: Sit  to/from Stand Sit to Stand: Min assist, Min guard           General transfer comment: Supervision due to recent complaints of dizziness, lightheadedness, and nausea.    Ambulation/Gait Ambulation/Gait assistance: Min guard Gait Distance (Feet): 50 Feet Assistive device: Rolling walker (2 wheels)         General Gait Details: slowed gait speed, required 1 seated rest break ~67ft   Stairs             Wheelchair Mobility     Tilt Bed    Modified Rankin (Stroke Patients Only)       Balance Overall balance assessment: Needs assistance Sitting-balance support: Bilateral upper extremity supported, Feet supported Sitting balance-Leahy Scale: Good     Standing balance support: Reliant on assistive device for balance, During functional activity Standing balance-Leahy Scale: Fair                              Cognition Arousal/Alertness: Awake/alert Behavior During Therapy: WFL for tasks assessed/performed Overall Cognitive Status: Within Functional Limits for tasks assessed                                          Exercises General Exercises - Lower Extremity Long Arc Quad: AAROM, Both, 10 reps, Seated Heel Raises: AAROM, Both, 20 reps, Seated    General Comments General comments (skin integrity, edema, etc.): lightheaded, dizzy, and nauseous throughout treatment      Pertinent Vitals/Pain Pain Assessment Pain Assessment: Faces Faces Pain Scale: Hurts  little more Pain Location: abdominal/ low back region Pain Descriptors / Indicators: Discomfort, Constant, Sore Pain Intervention(s): Monitored during session, Repositioned    Home Living                          Prior Function            PT Goals (current goals can now be found in the care plan section) Acute Rehab PT Goals Patient Stated Goal: Return home PT Goal Formulation: With patient Time For Goal Achievement: 08/06/22 Potential to Achieve Goals:  Good Progress towards PT goals: Progressing toward goals    Frequency    Min 1X/week      PT Plan Current plan remains appropriate    Co-evaluation              AM-PAC PT "6 Clicks" Mobility   Outcome Measure  Help needed turning from your back to your side while in a flat bed without using bedrails?: None Help needed moving from lying on your back to sitting on the side of a flat bed without using bedrails?: A Little Help needed moving to and from a bed to a chair (including a wheelchair)?: A Little Help needed standing up from a chair using your arms (e.g., wheelchair or bedside chair)?: A Little Help needed to walk in hospital room?: A Little Help needed climbing 3-5 steps with a railing? : A Little 6 Click Score: 19    End of Session Equipment Utilized During Treatment: Gait belt Activity Tolerance: Patient tolerated treatment well;Other (comment);Patient limited by fatigue (pt limited by dizziness/lightheadedness/nausea) Patient left: in chair;with call bell/phone within reach;with chair alarm set   PT Visit Diagnosis: Dizziness and giddiness (R42);Difficulty in walking, not elsewhere classified (R26.2);Muscle weakness (generalized) (M62.81);Unsteadiness on feet (R26.81)     Time: 6433-2951 PT Time Calculation (min) (ACUTE ONLY): 17 min  Charges:    $Therapeutic Activity: 8-22 mins PT General Charges $$ ACUTE PT VISIT: 1 Visit        Braylyn Eye, PT, SPT 10:21 AM,08/05/22

## 2022-08-06 ENCOUNTER — Encounter: Payer: 59 | Admitting: Student in an Organized Health Care Education/Training Program

## 2022-08-06 ENCOUNTER — Telehealth: Payer: Self-pay | Admitting: Family Medicine

## 2022-08-06 DIAGNOSIS — K3184 Gastroparesis: Secondary | ICD-10-CM | POA: Diagnosis present

## 2022-08-06 DIAGNOSIS — Z6841 Body Mass Index (BMI) 40.0 and over, adult: Secondary | ICD-10-CM | POA: Diagnosis not present

## 2022-08-06 DIAGNOSIS — N179 Acute kidney failure, unspecified: Secondary | ICD-10-CM | POA: Diagnosis present

## 2022-08-06 DIAGNOSIS — E785 Hyperlipidemia, unspecified: Secondary | ICD-10-CM | POA: Diagnosis present

## 2022-08-06 DIAGNOSIS — E1122 Type 2 diabetes mellitus with diabetic chronic kidney disease: Secondary | ICD-10-CM | POA: Diagnosis present

## 2022-08-06 DIAGNOSIS — I5032 Chronic diastolic (congestive) heart failure: Secondary | ICD-10-CM | POA: Diagnosis present

## 2022-08-06 DIAGNOSIS — G40A09 Absence epileptic syndrome, not intractable, without status epilepticus: Secondary | ICD-10-CM | POA: Diagnosis present

## 2022-08-06 DIAGNOSIS — K219 Gastro-esophageal reflux disease without esophagitis: Secondary | ICD-10-CM | POA: Diagnosis present

## 2022-08-06 DIAGNOSIS — J4489 Other specified chronic obstructive pulmonary disease: Secondary | ICD-10-CM | POA: Diagnosis present

## 2022-08-06 DIAGNOSIS — F32A Depression, unspecified: Secondary | ICD-10-CM | POA: Diagnosis present

## 2022-08-06 DIAGNOSIS — R140 Abdominal distension (gaseous): Secondary | ICD-10-CM | POA: Diagnosis not present

## 2022-08-06 DIAGNOSIS — E860 Dehydration: Secondary | ICD-10-CM | POA: Diagnosis present

## 2022-08-06 DIAGNOSIS — Z794 Long term (current) use of insulin: Secondary | ICD-10-CM | POA: Diagnosis not present

## 2022-08-06 DIAGNOSIS — F419 Anxiety disorder, unspecified: Secondary | ICD-10-CM | POA: Diagnosis present

## 2022-08-06 DIAGNOSIS — R739 Hyperglycemia, unspecified: Secondary | ICD-10-CM | POA: Diagnosis not present

## 2022-08-06 DIAGNOSIS — E1165 Type 2 diabetes mellitus with hyperglycemia: Secondary | ICD-10-CM | POA: Diagnosis present

## 2022-08-06 DIAGNOSIS — N390 Urinary tract infection, site not specified: Secondary | ICD-10-CM | POA: Diagnosis present

## 2022-08-06 DIAGNOSIS — I69354 Hemiplegia and hemiparesis following cerebral infarction affecting left non-dominant side: Secondary | ICD-10-CM | POA: Diagnosis not present

## 2022-08-06 DIAGNOSIS — E1140 Type 2 diabetes mellitus with diabetic neuropathy, unspecified: Secondary | ICD-10-CM | POA: Diagnosis present

## 2022-08-06 DIAGNOSIS — N3000 Acute cystitis without hematuria: Secondary | ICD-10-CM | POA: Diagnosis not present

## 2022-08-06 DIAGNOSIS — E872 Acidosis, unspecified: Secondary | ICD-10-CM | POA: Diagnosis present

## 2022-08-06 DIAGNOSIS — E1169 Type 2 diabetes mellitus with other specified complication: Secondary | ICD-10-CM | POA: Diagnosis present

## 2022-08-06 DIAGNOSIS — K567 Ileus, unspecified: Secondary | ICD-10-CM | POA: Diagnosis present

## 2022-08-06 DIAGNOSIS — E11319 Type 2 diabetes mellitus with unspecified diabetic retinopathy without macular edema: Secondary | ICD-10-CM | POA: Diagnosis present

## 2022-08-06 DIAGNOSIS — I130 Hypertensive heart and chronic kidney disease with heart failure and stage 1 through stage 4 chronic kidney disease, or unspecified chronic kidney disease: Secondary | ICD-10-CM | POA: Diagnosis present

## 2022-08-06 DIAGNOSIS — K56609 Unspecified intestinal obstruction, unspecified as to partial versus complete obstruction: Secondary | ICD-10-CM | POA: Diagnosis not present

## 2022-08-06 DIAGNOSIS — I2510 Atherosclerotic heart disease of native coronary artery without angina pectoris: Secondary | ICD-10-CM | POA: Diagnosis present

## 2022-08-06 DIAGNOSIS — E1143 Type 2 diabetes mellitus with diabetic autonomic (poly)neuropathy: Secondary | ICD-10-CM | POA: Diagnosis present

## 2022-08-06 LAB — BASIC METABOLIC PANEL
Anion gap: 9 (ref 5–15)
BUN: 42 mg/dL — ABNORMAL HIGH (ref 6–20)
CO2: 22 mmol/L (ref 22–32)
Calcium: 8.9 mg/dL (ref 8.9–10.3)
Chloride: 100 mmol/L (ref 98–111)
Creatinine, Ser: 1.86 mg/dL — ABNORMAL HIGH (ref 0.44–1.00)
GFR, Estimated: 31 mL/min — ABNORMAL LOW (ref >60)
Glucose, Bld: 167 mg/dL — ABNORMAL HIGH (ref 70–99)
Potassium: 4.5 mmol/L (ref 3.5–5.1)
Sodium: 131 mmol/L — ABNORMAL LOW (ref 135–145)

## 2022-08-06 LAB — URINALYSIS, W/ REFLEX TO CULTURE (INFECTION SUSPECTED)
Bilirubin Urine: NEGATIVE
Glucose, UA: NEGATIVE mg/dL
Hgb urine dipstick: NEGATIVE
Ketones, ur: NEGATIVE mg/dL
Nitrite: NEGATIVE
Protein, ur: NEGATIVE mg/dL
RBC / HPF: >50 RBC/hpf (ref 0–5)
Specific Gravity, Urine: 1.015 (ref 1.005–1.030)
pH: 5 (ref 5.0–8.0)

## 2022-08-06 LAB — GLUCOSE, CAPILLARY
Glucose-Capillary: 119 mg/dL — ABNORMAL HIGH (ref 70–99)
Glucose-Capillary: 133 mg/dL — ABNORMAL HIGH (ref 70–99)
Glucose-Capillary: 153 mg/dL — ABNORMAL HIGH (ref 70–99)
Glucose-Capillary: 199 mg/dL — ABNORMAL HIGH (ref 70–99)
Glucose-Capillary: 235 mg/dL — ABNORMAL HIGH (ref 70–99)
Glucose-Capillary: 357 mg/dL — ABNORMAL HIGH (ref 70–99)

## 2022-08-06 MED ORDER — ENSURE MAX PROTEIN PO LIQD
11.0000 [oz_av] | Freq: Two times a day (BID) | ORAL | Status: DC
  Administered 2022-08-06 – 2022-08-11 (×7): 11 [oz_av] via ORAL
  Filled 2022-08-06: qty 330

## 2022-08-06 MED ORDER — SODIUM CHLORIDE 0.9 % IV SOLN: INTRAVENOUS | Status: DC

## 2022-08-06 MED ORDER — ADULT MULTIVITAMIN W/MINERALS CH
1.0000 | ORAL_TABLET | Freq: Every day | ORAL | Status: DC
  Administered 2022-08-07 – 2022-08-11 (×5): 1 via ORAL
  Filled 2022-08-06 (×5): qty 1

## 2022-08-06 NOTE — Telephone Encounter (Signed)
Relayed to [employer]

## 2022-08-06 NOTE — Progress Notes (Signed)
PROGRESS NOTE  [name]    DOB: [birth year], 57 y.o.  VWU:[id number]    Code Status: Full Code   DOA: 08/03/2022   LOS: 0   Brief hospital course  [name] is a 57 y.o. female with a PMH significant for DM, COPD, prior CVA (left sided deficits), CKD, hypertension, diastolic CHF, hyperlipidemia, seizures, aortic root dilation, left eye blindness. Of note, recently discharged from [facility] on July 22, 2022 after treatment for urinary tract infection.  Review of patient culture data in the system shows patient was cultured for urine on July 19, 2022.  It grew E. coli and it was not sensitive to ciprofloxacin. She states that she completed this antibiotic at home prior to this admission  They presented from home to the ED on 08/03/2022 with hyperglycemia and generalized weakness x 1 days. Also endorses dysuria since last admission.  In the ED, it was found that they had stable vitals.  Significant findings included lactic acidosis, hyperglycemia, improved with hydration.  They were initially treated with analgesia, home medications, antibiotic, and insulin as well as IVF.   Patient was admitted to medicine service for further workup and management of hyperglycemia and UTI as outlined in detail below.  08/06/22 -improving  Assessment & Plan  Principal Problem:   UTI (urinary tract infection) Active Problems:   Lightheadedness   Uncontrolled type 2 diabetes mellitus with hyperglycemia (HCC)   Lower urinary tract infectious disease  [name]. Has been on CTX for >48 hours so may not get a proper sample to guide deescalation. Symptoms have been improving  AKI  Oliguria- unable to send for new urinalysis yet due to low urine output and now has AKI. Likely in relative hypovolemic state. Cr 1.86 from 0.89 yesterday. - gentle IV fluid hydration - I/O cath - BMP monitor am - avoid  nephrotoxic agents   Resistant Type II diabetes- patient on high doses long-acting and short-acting insulin. Glucose is better controlled but patient thinks that she has worsened symptoms if her glucose goes below 150 and declines any better diabetes control. - continue daily long acting, meal coverage, and sliding scale insulin - diet education needed -- Patient advised lifestyle with diet compliance and exercise   Generalized weakness -- seen by PT OT recommends home health. will arrange.   Hypertension -- continue home meds. Holding losartan for new AKI   Patient has multiple other medical problems which remains stable   Body mass index is 39.75 kg/m.  VTE ppx: SCDs Start: 08/03/22 2029  Diet:     Diet   Diet Carb Modified Fluid consistency: Thin; Room service appropriate? Yes   Consultants: None   Subjective 08/06/22    Pt reports feeling improved today. She is concerned that her blood sugars are dangerously low in the 150-160 range and that she states she gets seizures when her glucose becomes that low. Denies any current seizure activity.    Objective   Vitals:   08/05/22 1543 08/05/22 1934 08/06/22 0437 08/06/22 0500  BP: 115/76 114/76 102/65   Pulse: 88 86 81   Resp: 16 16 20    Temp: 98.1 F (36.7 C) 98.2 F (36.8 C) 98.7 F (37.1 C)   TempSrc: Oral     SpO2: 94% 96% 93%   Weight:    111.7 kg  Height:        Intake/Output Summary (Last 24 hours) at 08/06/2022 0751  Last data filed at 08/05/2022 1936 Gross per 24 hour  Intake 120 ml  Output 1 ml  Net 119 ml   Filed Weights   08/04/22 0937 08/05/22 0500 08/06/22 0500  Weight: 107.5 kg 109.8 kg 111.7 kg    Physical Exam:  General: awake, alert, NAD HEENT: atraumatic, clear conjunctiva, anicteric sclera, MMM, hearing grossly normal Respiratory: normal respiratory effort. Cardiovascular: quick capillary refill Nervous: A&O x3. no gross focal neurologic deficits, normal speech Extremities: moves all  equally, no edema, normal tone Skin: dry, intact, normal temperature, normal color. No rashes, lesions or ulcers on exposed skin Psychiatry: normal mood, congruent affect  Labs   I have personally reviewed the following labs and imaging studies CBC    Component Value Date/Time   WBC 7.8 08/04/2022 0641   RBC 4.57 08/04/2022 0641   HGB 13.2 08/04/2022 0641   HGB 13.1 05/08/2015 1106   HCT 39.5 08/04/2022 0641   HCT 39.5 05/08/2015 1106   PLT 215 08/04/2022 0641   PLT 274 05/08/2015 1106   MCV 86.4 08/04/2022 0641   MCV 91 05/08/2015 1106   MCH 28.9 08/04/2022 0641   MCHC 33.4 08/04/2022 0641   RDW 13.0 08/04/2022 0641   RDW 14.1 05/08/2015 1106   LYMPHSABS 1.7 08/03/2022 1406   LYMPHSABS 1.7 05/08/2015 1106   MONOABS 0.5 08/03/2022 1406   EOSABS 0.0 08/03/2022 1406   EOSABS 0.0 05/08/2015 1106   BASOSABS 0.0 08/03/2022 1406   BASOSABS 0.0 05/08/2015 1106      Latest Ref Rng & Units 08/06/2022    5:22 AM 08/04/2022    6:41 AM 08/03/2022    2:06 PM  BMP  Glucose 70 - 99 mg/dL 784  696  295   BUN 6 - 20 mg/dL 42  17  15   Creatinine 0.44 - 1.00 mg/dL 2.84  1.32  4.40   Sodium 135 - 145 mmol/L 131  135  133   Potassium 3.5 - 5.1 mmol/L 4.5  4.9  3.8   Chloride 98 - 111 mmol/L 100  104  101   CO2 22 - 32 mmol/L 22  19  22    Calcium 8.9 - 10.3 mg/dL 8.9  9.2  8.8     No results found.  Disposition Plan & Communication  Patient status: Observation  Admitted From: Home Planned disposition location: Home health Anticipated discharge date: 7/19 pending clinical improvement and further uxcx results  Family Communication: none at bedside    Author: Leeroy Bock, DO [facility] 08/06/2022, 7:51 AM   Available by Epic secure chat 7AM-7PM. If 7PM-7AM, please contact night-coverage.  [facility] contact information found on [email or web address].

## 2022-08-06 NOTE — Telephone Encounter (Signed)
Copied from CRM [id number]. Topic: General - Other >> Aug 06, 2022 10:38 AM Epimenio Foot F wrote: Reason for CRM: Coralee North with [facility] is calling in because pt is in the hospital and she wants to verify if Sheliah Mends will sign off on pt's home health orders. Please follow up with Coralee North. Coralee North says it is okay to leave a voicemail.

## 2022-08-06 NOTE — Plan of Care (Signed)
Nutrition Education Note   RD consulted for nutrition education regarding diabetes.   57 y/o female with h/o seizures, DM, HTN, anxiety, MDD, HLD, OSA, CAD, GERD, brain hematoma, CVA, CHF, MI, incisional hernia s/p mesh repair 2013 and SBO now s/p ex lap with LOA 5/25 and who is now admitted with UTI.   Pt is well known to this RD from a recent previous admission where patient required temporary TPN. Pt reports that she has been doing well at home. Pt reports that she has completely healed up from surgery. Pt reports that her appetite and oral intake has been good. Pt reports that her children have got her on a schedule of eating 3 solid meals a day with 10am, 2pm and 8pm snacks. Pt does report that sometimes, she will sleep through a meal or a snack. Of note, pt is lactose intolerant. Pt reports that she has been eating a lot of "zero sugar" foods at home. Pt reports that she has been exercising with arm weights and she is able to walk around the house with her cain.    Lab Results  Component Value Date   HGBA1C 9.7 (H) 07/20/2022    RD provided "Nutrition and Type II Diabetes" handout from the Academy of [employer]etics. Discussed different food groups and their effects on blood sugar, emphasizing carbohydrate-containing foods. Provided list of carbohydrates and recommended serving sizes of common foods. RD discussed artifical sweeteners with patient and their effects on blood sugar.   Discussed importance of controlled and consistent carbohydrate intake throughout the day. Provided examples of ways to balance meals/snacks and encouraged intake of high-fiber, whole grain complex carbohydrates. Teach back method used.  Expect fair compliance.  Body mass index is 39.75 kg/m. Pt meets criteria for obesity based on current BMI.  Current diet order is CHO modified, patient is consuming approximately 100% of meals at this time. Labs and medications reviewed.   Will add Ensure Max protein  supplement BID per patient's request.   No further nutrition interventions warranted at this time. RD contact information provided. If additional nutrition issues arise, please re-consult RD.  Betsey Holiday MS, RD, LDN Please refer to [facility] for RD and/or RD on-call/weekend/after hours pager

## 2022-08-07 DIAGNOSIS — N179 Acute kidney failure, unspecified: Secondary | ICD-10-CM | POA: Diagnosis not present

## 2022-08-07 DIAGNOSIS — N390 Urinary tract infection, site not specified: Secondary | ICD-10-CM | POA: Diagnosis not present

## 2022-08-07 DIAGNOSIS — R739 Hyperglycemia, unspecified: Secondary | ICD-10-CM | POA: Diagnosis not present

## 2022-08-07 LAB — GLUCOSE, CAPILLARY
Glucose-Capillary: 279 mg/dL — ABNORMAL HIGH (ref 70–99)
Glucose-Capillary: 189 mg/dL — ABNORMAL HIGH (ref 70–99)
Glucose-Capillary: 225 mg/dL — ABNORMAL HIGH (ref 70–99)
Glucose-Capillary: 169 mg/dL — ABNORMAL HIGH (ref 70–99)

## 2022-08-07 LAB — BASIC METABOLIC PANEL
Anion gap: 10 (ref 5–15)
BUN: 45 mg/dL — ABNORMAL HIGH (ref 6–20)
CO2: 25 mmol/L (ref 22–32)
Calcium: 9.1 mg/dL (ref 8.9–10.3)
Chloride: 99 mmol/L (ref 98–111)
Creatinine, Ser: 1.48 mg/dL — ABNORMAL HIGH (ref 0.44–1.00)
GFR, Estimated: 41 mL/min — ABNORMAL LOW (ref >60)
Glucose, Bld: 267 mg/dL — ABNORMAL HIGH (ref 70–99)
Potassium: 4.4 mmol/L (ref 3.5–5.1)
Sodium: 134 mmol/L — ABNORMAL LOW (ref 135–145)

## 2022-08-07 MED ORDER — SORBITOL 70 % SOLN
960.0000 mL | TOPICAL_OIL | Freq: Every day | ORAL | Status: DC | PRN
  Administered 2022-08-07: 960 mL via RECTAL
  Filled 2022-08-07 (×2): qty 240

## 2022-08-07 MED ORDER — ALUM & MAG HYDROXIDE-SIMETH 200-200-20 MG/5ML PO SUSP
30.0000 mL | Freq: Four times a day (QID) | ORAL | Status: DC | PRN
  Administered 2022-08-07: 30 mL via ORAL

## 2022-08-07 MED ORDER — CALCIUM CARBONATE ANTACID 500 MG PO CHEW: 1.0000 | CHEWABLE_TABLET | Freq: Two times a day (BID) | ORAL | Status: DC | PRN

## 2022-08-07 NOTE — Progress Notes (Signed)
Occupational Therapy Treatment Patient Details Name: [name] MRN: [id number] DOB: 16-Dec-1965 Today's Date: 08/07/2022   History of present illness Pt is a 57 y/o female who presented to the ED with UTI and generalized weakness. PMH includes COPD, DM, HTN, GAD, UTI, OSA on CPAP, CVA, CKS, diastolic CHF, HLD, seizures, L eye blindness. She was recently d/c on 7/3 for a UTI.   OT comments  Pt seen for OT tx. Pt endorses feeling constipated and having recently taken medication for it. Pt agreeable to session focused on ADL tasks. Mod indep with bed mobility. Pt completed UB and LB bathing with setup and supv in sitting, CGA in standing with no overt LOB noted. Pt tolerated well. Pt educated in falls prevention and activity pacing as well as AE to promote independence with ADL tasks when having a "bad day" at home when she would typically require assist from family. Pt progressing towards goals. Continues to benefit.    Recommendations for follow up therapy are one component of a multi-disciplinary discharge planning process, led by the attending physician.  Recommendations may be updated based on patient status, additional functional criteria and insurance authorization.    Assistance Recommended at Discharge Intermittent Supervision/Assistance  Patient can return home with the following  A little help with walking and/or transfers;A little help with bathing/dressing/bathroom;Assist for transportation;Assistance with cooking/housework;Help with stairs or ramp for entrance   Equipment Recommendations  None recommended by OT    Recommendations for Other Services      Precautions / Restrictions Precautions Precautions: Fall Restrictions Weight Bearing Restrictions: No       Mobility Bed Mobility Overal bed mobility: Needs Assistance Bed Mobility: Supine to Sit, Sit to Supine     Supine to sit: Modified independent (Device/Increase time) Sit to supine: Modified independent  (Device/Increase time)        Transfers Overall transfer level: Needs assistance Equipment used: None Transfers: Sit to/from Stand Sit to Stand: Min guard           General transfer comment: repeated STS EOB for bathing     Balance Overall balance assessment: Needs assistance Sitting-balance support: No upper extremity supported, Feet supported Sitting balance-Leahy Scale: Good Sitting balance - Comments: good static and fair+ dynamic sitting balance   Standing balance support: No upper extremity supported, During functional activity Standing balance-Leahy Scale: Fair Standing balance comment: fair static standing balance during perineal bathing with CGA                           ADL either performed or assessed with clinical judgement   ADL Overall ADL's : Needs assistance/impaired     Grooming: Sitting;Set up;Supervision/safety   Upper Body Bathing: Sitting;Set up;Supervision/ safety   Lower Body Bathing: Sitting/lateral leans;Sit to/from stand;Set up;Supervison/ safety;Min guard Lower Body Bathing Details (indicate cue type and reason): CGA in standing otherwise seated with supervision Upper Body Dressing : Sitting;Set up                          Extremity/Trunk Assessment              Vision       Perception     Praxis      Cognition Arousal/Alertness: Awake/alert Behavior During Therapy: WFL for tasks assessed/performed Overall Cognitive Status: Within Functional Limits for tasks assessed  Exercises Other Exercises Other Exercises: Pt educated in falls prevention, activity pacing    Shoulder Instructions       General Comments      Pertinent Vitals/ Pain       Pain Assessment Pain Assessment: 0-10 Pain Score: 7  Pain Location: LBP Pain Descriptors / Indicators: Discomfort, Constant, Sore Pain Intervention(s): Limited activity within patient's tolerance,  Monitored during session, Premedicated before session, Repositioned  Home Living                                          Prior Functioning/Environment              Frequency  Min 1X/week        Progress Toward Goals  OT Goals(current goals can now be found in the care plan section)  Progress towards OT goals: Progressing toward goals  Acute Rehab OT Goals Patient Stated Goal: be able to live independently OT Goal Formulation: With patient Time For Goal Achievement: 08/18/22 Potential to Achieve Goals: Good  Plan Discharge plan remains appropriate;Frequency remains appropriate    Co-evaluation                 AM-PAC OT "6 Clicks" Daily Activity     Outcome Measure   Help from another person eating meals?: None Help from another person taking care of personal grooming?: None Help from another person toileting, which includes using toliet, bedpan, or urinal?: A Little Help from another person bathing (including washing, rinsing, drying)?: A Little Help from another person to put on and taking off regular upper body clothing?: None Help from another person to put on and taking off regular lower body clothing?: A Little 6 Click Score: 21    End of Session    OT Visit Diagnosis: Other abnormalities of gait and mobility (R26.89);Muscle weakness (generalized) (M62.81)   Activity Tolerance Patient tolerated treatment well   Patient Left in bed;with call bell/phone within reach   Nurse Communication          Time: 1040-1110 OT Time Calculation (min): 30 min  Charges: OT General Charges $OT Visit: 1 Visit OT Treatments $Self Care/Home Management : 23-37 mins  Arman Filter., MPH, MS, OTR/L ascom [phone] 08/07/22, 11:12 AM

## 2022-08-07 NOTE — TOC Progression Note (Signed)
Transition of Care [facility]) - Progression Note    Patient Details  Name: [name] MRN: [id number] Date of Birth: 1965/07/23  [facility] of [facility]) CM/SW Contact  Truddie Hidden, RN Phone Number: 08/07/2022, 1:11 PM  Clinical Narrative:    Case reviewed. No new TOC needs assessed.    Expected Discharge Plan: Home w Home Health Services Barriers to Discharge: Continued Medical Work up  Expected Discharge Plan and Services     Post Acute Care Choice: Durable Medical Equipment, Home Health Living arrangements for the past 2 months: Single Family Home                                       Social Determinants of Health (SDOH) Interventions SDOH Screenings   Food Insecurity: No Food Insecurity (08/03/2022)  Recent Concern: Food Insecurity - Food Insecurity Present (06/28/2022)  Housing: Patient Declined (08/03/2022)  Transportation Needs: No Transportation Needs (08/03/2022)  Utilities: Not At Risk (08/03/2022)  Alcohol Screen: Low Risk  (03/26/2022)  Depression (PHQ2-9): Low Risk  (06/29/2022)  Financial Resource Strain: Medium Risk (06/28/2022)  Physical Activity: Insufficiently Active (06/28/2022)  Social Connections: Moderately Integrated (06/28/2022)  Stress: No Stress Concern Present (06/28/2022)  Tobacco Use: Medium Risk (08/04/2022)    Readmission Risk Interventions    06/11/2021   11:43 AM 05/19/2021   12:02 PM 04/24/2021    3:02 PM  Readmission Risk Prevention Plan  Transportation Screening Complete Complete Complete  PCP or Specialist Appt within 5-7 Days  Complete   Home Care Screening  Complete   Medication Review (RN CM)  Complete   HRI or Home Care Consult   Complete  Social Work Consult for Recovery Care Planning/Counseling   --  Palliative Care Screening   Not Applicable  Medication Review [employer]) Complete  Complete  PCP or Specialist appointment within 3-5 days of discharge Complete    HRI or Home Care Consult Complete    SW Recovery  Care/Counseling Consult Complete    Palliative Care Screening Not Applicable    Skilled Nursing Facility Not Applicable

## 2022-08-07 NOTE — Progress Notes (Signed)
Physical Therapy Treatment Patient Details Name: [name] MRN: [id number] DOB: 08-14-1965 Today's Date: 08/07/2022   History of Present Illness Pt is a 57 y/o female who presented to the ED with UTI and generalized weakness. PMH includes COPD, DM, HTN, GAD, UTI, OSA on CPAP, CVA, CKS, diastolic CHF, HLD, seizures, L eye blindness. She was recently d/c on 7/3 for a UTI.    PT Comments  Pt received in bed, agreed to PT. Pt verbalized concerns of difficulty voiding and not having had a BM in 7 days. Pt also with hx of multiple SBO, Nursing and MD notified. Pt educated on benefits of frequent mobilization throughout the day to help facilitate concerns. Poor po intake due to distended abdomen. Pt however did tolerate short gait distance of 30' x 2 with RW. Seated rest break due to c/o dizziness and LE"s becoming shaky/unsteady requiring chair to be brought to pt by nursing. BP 98/70, HR 81 after several minutes at rest. Pt states when EMS arrived to transport to ED, she experienced a syncopal episode as well. Will continue to monitor vitals and increase mobility as tolerated.     Assistance Recommended at Discharge PRN  If plan is discharge home, recommend the following:  Can travel by private vehicle    Assist for transportation;Help with stairs or ramp for entrance;A little help with bathing/dressing/bathroom;Assistance with cooking/housework;A little help with walking and/or transfers;Direct supervision/assist for medications management      Equipment Recommendations  Rolling walker (2 wheels)    Recommendations for Other Services       Precautions / Restrictions Precautions Precautions: Fall Precaution Comments: Lightheaded/dizziness still present Restrictions Weight Bearing Restrictions: No     Mobility  Bed Mobility Overal bed mobility: Needs Assistance Bed Mobility: Supine to Sit, Sit to Supine     Supine to sit: Modified independent (Device/Increase time) Sit to  supine: Modified independent (Device/Increase time)   General bed mobility comments: increased time    Transfers Overall transfer level: Needs assistance Equipment used: None Transfers: Sit to/from Stand Sit to Stand: Supervision                Ambulation/Gait Ambulation/Gait assistance: Min guard Gait Distance (Feet): 30 Feet Assistive device: Rolling walker (2 wheels) Gait Pattern/deviations: Step-through pattern, Antalgic Gait velocity: decreased     General Gait Details:  (Pt became lightheaded after 72ft with LE's shaking requiring a chair to be brought for seated rest break)   Stairs             Wheelchair Mobility     Tilt Bed    Modified Rankin (Stroke Patients Only)       Balance Overall balance assessment: Needs assistance Sitting-balance support: No upper extremity supported, Feet supported Sitting balance-Leahy Scale: Good Sitting balance - Comments: good static and fair+ dynamic sitting balance   Standing balance support: During functional activity, Bilateral upper extremity supported, Reliant on assistive device for balance Standing balance-Leahy Scale: Fair Standing balance comment: ! occasion of unsteadiness, balance regained with MinA                            Cognition Arousal/Alertness: Awake/alert Behavior During Therapy: WFL for tasks assessed/performed Overall Cognitive Status: Within Functional Limits for tasks assessed                                 General Comments: Pleasant  and motivated        Exercises General Exercises - Lower Extremity Ankle Circles/Pumps: AROM, Both, 10 reps, Seated Long Arc Quad: AAROM, Both, 10 reps, Seated Hip Flexion/Marching: AROM, Both, 10 reps, Seated Other Exercises Other Exercises: Increased time spent assessing BP and resolving c/o dizziness prior to mobilizing. Educated on benefits of mobility to assist with current constipation and hx of SBO(s).     General Comments General comments (skin integrity, edema, etc.): Pt educated on role of PT, Safety awareness during changes in position and LE strengthening exercises.      Pertinent Vitals/Pain Pain Assessment Pain Assessment: No/denies pain    Home Living                          Prior Function            PT Goals (current goals can now be found in the care plan section) Acute Rehab PT Goals Patient Stated Goal: Return home Progress towards PT goals: Progressing toward goals    Frequency    Min 1X/week      PT Plan Current plan remains appropriate    Co-evaluation              AM-PAC PT "6 Clicks" Mobility   Outcome Measure  Help needed turning from your back to your side while in a flat bed without using bedrails?: None Help needed moving from lying on your back to sitting on the side of a flat bed without using bedrails?: A Little Help needed moving to and from a bed to a chair (including a wheelchair)?: A Little Help needed standing up from a chair using your arms (e.g., wheelchair or bedside chair)?: A Little Help needed to walk in hospital room?: A Little Help needed climbing 3-5 steps with a railing? : A Little 6 Click Score: 19    End of Session Equipment Utilized During Treatment: Gait belt Activity Tolerance: Patient tolerated treatment well;Other (comment) (Limited due to lightheadedness in standing) Patient left: in chair;with call bell/phone within reach;with chair alarm set Nurse Communication: Mobility status (dizziness) PT Visit Diagnosis: Dizziness and giddiness (R42);Difficulty in walking, not elsewhere classified (R26.2);Muscle weakness (generalized) (M62.81);Unsteadiness on feet (R26.81)     Time: 4034-7425 PT Time Calculation (min) (ACUTE ONLY): 50 min  Charges:    $Gait Training: 8-22 mins $Therapeutic Exercise: 8-22 mins $Therapeutic Activity: 8-22 mins PT General Charges $$ ACUTE PT VISIT: 1 Visit                     Zadie Cleverly, PTA  Jannet Askew 08/07/2022, 4:14 PM

## 2022-08-07 NOTE — Progress Notes (Addendum)
PROGRESS NOTE  [name]    DOB: 08-Dec-1965, 57 y.o.  ZOX:[id number]    Code Status: Full Code   DOA: 08/03/2022   LOS: 1   Brief hospital course  [name] is a 57 y.o. female with a PMH significant for DM, COPD, prior CVA (left sided deficits), CKD, hypertension, diastolic CHF, hyperlipidemia, seizures, aortic root dilation, left eye blindness. Of note, recently discharged from [facility] on July 22, 2022 after treatment for urinary tract infection.  Review of patient culture data in the system shows patient was cultured for urine on July 19, 2022.  It grew E. coli and it was not sensitive to ciprofloxacin. She states that she completed this antibiotic at home prior to this admission  They presented from home to the ED on 08/03/2022 with hyperglycemia and generalized weakness x 1 days. Also endorses dysuria since last admission.  In the ED, it was found that they had stable vitals.  Significant findings included lactic acidosis, hyperglycemia, improved with hydration. Urinalysis positive for >500 glucose, moderate leukocytes, rare bacteria. Urine culture was collected.   They were initially treated with analgesia, home medications, antibiotic, and insulin as well as IVF.   Patient was admitted to medicine service for further workup and management of hyperglycemia and UTI as outlined in detail below.  7/15-7/19: patient continues to remain on IV Abx due to continue dysuria and unfortunately her UxCx was multi-species so unable to make clear de-escalation of Abx. Her urine was recollected on 7/18 to repeat UxCx.  Additionally, patient had decreased UOP with an AKI (Cr increased from 0.89>1.86) so was started on IVF.   08/07/22 -Cr improved on IVF from 1.86>1.48. only had one void overnight and bladder scan 57mL this am. Will continue IV fluids and recheck in the am.   Assessment & Plan  Principal Problem:   UTI (urinary tract infection) Active Problems:   AKI (acute  kidney injury) (HCC)   Lightheadedness   Uncontrolled type 2 diabetes mellitus with hyperglycemia (HCC)   Lower urinary tract infectious disease  Recurrent UTI- UxCx this admission resulted in multiple species so attempting to recollect. Had been on CTX for >48 hours prior to re-collection so may not get a proper sample to guide deescalation. Symptoms have been improving but continues to endorse dysuria - continue IV Abx and de-escalate when clinically able or cultures resulted  AKI  Oliguria- low urine output and now has AKI. Likely in relative hypovolemic state. Cr from 0.89>1.86>1.48. improving since IVF initiated. Bladder scan this am 58mL. Question if this could be overmedicated reaction with chronic mirabegron- will hold for now - gentle IV fluid hydration - I/O cath completed 7/18 for repeat UxCx - BMP monitor am - avoid nephrotoxic agents, holding losartan - strict I/O - holding mirabegron   Resistant Type II diabetes- patient on high doses long-acting and short-acting insulin. Glucose is better controlled but patient thinks that she has worsened symptoms if her glucose goes below 150 and declines any better diabetes control. - continue daily long acting, meal coverage, and sliding scale insulin - diet education continues -- Patient advised lifestyle with diet compliance and exercise - continue statin   Generalized weakness -- seen by PT OT recommends home health. will arrange.   Hypertension -- continue home meds- clonidine, carvedilol, amlodipine, lasix, hydralazine. Holding losartan for new AKI and BP has remained well controlled   Seizure disorder - continue keppra, amitriptyline, gabapentin  Depression/anxiety - continue home meds- duloxetine  Body mass index is 39.5 kg/m.  VTE ppx: SCDs Start: 08/03/22 2029, lovenox  Diet:     Diet   Diet Carb Modified Fluid consistency: Thin; Room service appropriate? Yes   Consultants: None   Subjective 08/07/22     Pt reports feeling alright today. Endorses only 1 void since her I&O cath yesterday and had burning sensation. Had some nausea this morning treated with zofran. Denies abdominal pain.    Objective   Vitals:   08/06/22 1447 08/06/22 2027 08/07/22 0450 08/07/22 0455  BP: 111/67 107/66 104/73   Pulse: 80 86 90   Resp: 18 15 17    Temp: 98.1 F (36.7 C) 98.4 F (36.9 C) 98.6 F (37 C)   TempSrc: Oral Oral Oral   SpO2: 97% 97% 96%   Weight:    111 kg  Height:        Intake/Output Summary (Last 24 hours) at 08/07/2022 0748 Last data filed at 08/07/2022 0316 Gross per 24 hour  Intake 2554.65 ml  Output 1550 ml  Net 1004.65 ml   Filed Weights   08/05/22 0500 08/06/22 0500 08/07/22 0455  Weight: 109.8 kg 111.7 kg 111 kg    Physical Exam:  General: awake, alert, NAD HEENT: atraumatic, clear conjunctiva, anicteric sclera, MMM, hearing grossly normal Respiratory: normal respiratory effort. Cardiovascular: quick capillary refill Nervous: A&O x3. no gross focal neurologic deficits, normal speech Extremities: moves all equally, no edema, normal tone Skin: dry, intact, normal temperature, normal color. No rashes, lesions or ulcers on exposed skin Psychiatry: normal mood, congruent affect  Labs   I have personally reviewed the following labs and imaging studies CBC    Component Value Date/Time   WBC 7.8 08/04/2022 0641   RBC 4.57 08/04/2022 0641   HGB 13.2 08/04/2022 0641   HGB 13.1 05/08/2015 1106   HCT 39.5 08/04/2022 0641   HCT 39.5 05/08/2015 1106   PLT 215 08/04/2022 0641   PLT 274 05/08/2015 1106   MCV 86.4 08/04/2022 0641   MCV 91 05/08/2015 1106   MCH 28.9 08/04/2022 0641   MCHC 33.4 08/04/2022 0641   RDW 13.0 08/04/2022 0641   RDW 14.1 05/08/2015 1106   LYMPHSABS 1.7 08/03/2022 1406   LYMPHSABS 1.7 05/08/2015 1106   MONOABS 0.5 08/03/2022 1406   EOSABS 0.0 08/03/2022 1406   EOSABS 0.0 05/08/2015 1106   BASOSABS 0.0 08/03/2022 1406   BASOSABS 0.0 05/08/2015  1106      Latest Ref Rng & Units 08/07/2022    6:27 AM 08/06/2022    5:22 AM 08/04/2022    6:41 AM  BMP  Glucose 70 - 99 mg/dL 161  096  045   BUN 6 - 20 mg/dL 45  42  17   Creatinine 0.44 - 1.00 mg/dL 4.09  8.11  9.14   Sodium 135 - 145 mmol/L 134  131  135   Potassium 3.5 - 5.1 mmol/L 4.4  4.5  4.9   Chloride 98 - 111 mmol/L 99  100  104   CO2 22 - 32 mmol/L 25  22  19    Calcium 8.9 - 10.3 mg/dL 9.1  8.9  9.2     No results found.  Disposition Plan & Communication  Patient status: Inpatient  Admitted From: Home Planned disposition location: Home health Anticipated discharge date: 7/20 pending clinical improvement and further uxcx results  Family Communication: none at bedside    Author: Leeroy Bock, DO [facility] 08/07/2022, 7:48 AM   Available by  Epic secure chat 7AM-7PM. If 7PM-7AM, please contact night-coverage.  [facility] contact information found on [email or web address].

## 2022-08-07 NOTE — Plan of Care (Signed)

## 2022-08-08 ENCOUNTER — Inpatient Hospital Stay: Payer: 59

## 2022-08-08 DIAGNOSIS — R140 Abdominal distension (gaseous): Secondary | ICD-10-CM | POA: Insufficient documentation

## 2022-08-08 DIAGNOSIS — K56609 Unspecified intestinal obstruction, unspecified as to partial versus complete obstruction: Secondary | ICD-10-CM | POA: Diagnosis not present

## 2022-08-08 DIAGNOSIS — K567 Ileus, unspecified: Secondary | ICD-10-CM | POA: Diagnosis not present

## 2022-08-08 DIAGNOSIS — N3000 Acute cystitis without hematuria: Secondary | ICD-10-CM | POA: Diagnosis not present

## 2022-08-08 LAB — GLUCOSE, CAPILLARY
Glucose-Capillary: 162 mg/dL — ABNORMAL HIGH (ref 70–99)
Glucose-Capillary: 120 mg/dL — ABNORMAL HIGH (ref 70–99)
Glucose-Capillary: 149 mg/dL — ABNORMAL HIGH (ref 70–99)
Glucose-Capillary: 119 mg/dL — ABNORMAL HIGH (ref 70–99)

## 2022-08-08 LAB — BASIC METABOLIC PANEL
Anion gap: 6 (ref 5–15)
BUN: 35 mg/dL — ABNORMAL HIGH (ref 6–20)
CO2: 25 mmol/L (ref 22–32)
Calcium: 9 mg/dL (ref 8.9–10.3)
Chloride: 105 mmol/L (ref 98–111)
Creatinine, Ser: 1.15 mg/dL — ABNORMAL HIGH (ref 0.44–1.00)
GFR, Estimated: 56 mL/min — ABNORMAL LOW (ref >60)
Glucose, Bld: 174 mg/dL — ABNORMAL HIGH (ref 70–99)
Potassium: 4.4 mmol/L (ref 3.5–5.1)
Sodium: 136 mmol/L (ref 135–145)

## 2022-08-08 LAB — URINE CULTURE: Culture: 80000 — AB

## 2022-08-08 LAB — CBC
HCT: 38.7 % (ref 36.0–46.0)
Hemoglobin: 12.4 g/dL (ref 12.0–15.0)
MCH: 29.1 pg (ref 26.0–34.0)
MCHC: 32 g/dL (ref 30.0–36.0)
MCV: 90.8 fL (ref 80.0–100.0)
Platelets: 261 10*3/uL (ref 150–400)
RBC: 4.26 MIL/uL (ref 3.87–5.11)
RDW: 13.9 % (ref 11.5–15.5)
WBC: 7.2 10*3/uL (ref 4.0–10.5)
nRBC: 0 % (ref 0.0–0.2)

## 2022-08-08 MED ORDER — SODIUM CHLORIDE 0.9 % IV SOLN: INTRAVENOUS | Status: AC

## 2022-08-08 MED ORDER — SODIUM CHLORIDE 0.9 % IV SOLN: INTRAVENOUS | Status: DC

## 2022-08-08 MED ORDER — SODIUM CHLORIDE 0.9 % IV SOLN: 12.5000 mg | Freq: Four times a day (QID) | INTRAVENOUS | Status: DC | PRN

## 2022-08-08 MED ORDER — IOHEXOL 350 MG/ML SOLN
100.0000 mL | Freq: Once | INTRAVENOUS | Status: AC | PRN
  Administered 2022-08-08: 100 mL via INTRAVENOUS

## 2022-08-08 MED ORDER — SODIUM CHLORIDE 0.9 % IV SOLN
1.0000 g | INTRAVENOUS | Status: DC
  Administered 2022-08-08 – 2022-08-09 (×2): 1 g via INTRAVENOUS
  Filled 2022-08-08 (×2): qty 10

## 2022-08-08 NOTE — Assessment & Plan Note (Signed)
Home amlodipine 10 mg daily, carvedilol 25 mg p.o. twice daily, hydralazine 50 mg every 8 hours, furosemide 20 mg daily

## 2022-08-08 NOTE — Assessment & Plan Note (Addendum)
Patient has had prior history of small bowel obstruction With patient endorsing constipation/abdominal discomfort similar to prior episodes of SBO Patient is status post enema on 08/07/2022, with moderate bowel movement yesterday Patient states she still feels that her abdomen is distended and uncomfortable like she has an SBO One-time KUB ordered, completed and pending read at the time of this dictation Message and signout with cross coverage provider, if KUB is not definitive, can consider a CT abdomen and pelvis with contrast Sodium chloride 100 mL/h, 1 day ordered

## 2022-08-08 NOTE — Assessment & Plan Note (Addendum)
Home long-acting insulin Semglee 70 units subcutaneous daily Insulin SSI with at bedtime coverage ordered

## 2022-08-08 NOTE — Assessment & Plan Note (Addendum)
Ceftriaxone IV 1 g daily, to complete 7-day course

## 2022-08-08 NOTE — Progress Notes (Addendum)
Addendum: KUB resulted for possible ileus versus early small bowel obstruction. - Diet order discontinued - N.p.o. except for sips and meds with nursing order to advance as tolerated to clear liquid - CT abdomen pelvis with contrast ordered - Continue sodium chloride at 125 liters per hour, 1 day ordered - Conservative manage meant at this time as patient denies being nauseous, general surgery not yet consulted.  No reported vomiting - As needed ondansetron 4 mg IV every 6 hours as needed for nausea and vomiting; Phenergan 12.5 mg IV every 6 hours as needed for refractory nausea and vomiting, 3 doses ordered   PROGRESS NOTE    [name]  QVZ:[id number] DOB: 27-Oct-1965 DOA: 08/03/2022 PCP: [name], [name]   No notes on file  Brief Narrative:   [name] is a 57 year old female with history of SBO, coronary artery disease, morbid obesity, GERD, heart failure preserved ejection fraction, uncontrolled diabetes mellitus, insulin-dependent diabetes mellitus, who presents emergency department for chief concerns of hyperglycemia.  08/03/2022: Admitted for UTI, uncontrolled diabetes mellitus  08/06/2022: Patient had acute kidney injury.  Patient was treated with gentle IV hydration, in and out, avoiding nephrotoxic agents.  7/19: Patient had a moderate to large bowel movement enema.  08/08/2022: Improving renal function, not back to baseline.  Continue gentle IV hydration.  Patient endorsing feeling of abdominal discomfort/abdominal distention consistent with history of SBO. KUB ordered and completed, pending read by radiology.  Messaged with cross coverage provider if KUB is not definitive, consider CT abdomen and pelvis to rule out small bowel obstruction  Assessment & Plan:   Principal Problem:   UTI (urinary tract infection) Active Problems:   Obesity, Class III, BMI 40-49.9 (morbid obesity) (HCC)   AKI (acute kidney injury) (HCC)   Essential hypertension, benign    GERD without esophagitis   Chronic diastolic CHF (congestive heart failure) (HCC)   Dyslipidemia associated with type 2 diabetes mellitus (HCC)   CAD (coronary artery disease)   Lightheadedness   Hemiparesis affecting left side as late effect of cerebrovascular accident (CVA) (HCC)   Uncontrolled type 2 diabetes mellitus with hyperglycemia (HCC)   Insulin dependent type 2 diabetes mellitus (HCC)   Lower urinary tract infectious disease   Abdominal distension   Assessment and Plan:  * UTI (urinary tract infection) This has to be interpreted in the context of patient's urine culture from July 19, 2022 which grew E. coli with intermediate sensitivity to ciprofloxacin.  At the time of patient's discharge on July 22, 2022.  Patient was advised to stop taking ciprofloxacin and be treated with Keflex.  Unfortunately patient took ciprofloxacin for approximately 7 days and subsequent to that only started taking Keflex.  Unfortunately patient has failed this treatment and now has ongoing symptoms of dysuria.  With associated leukocytes in the urine.  As well as mild lactic acidosis.  Additional factors for failure in this patient would include her uncontrolled diabetes mellitus with hyperglycemia.  Regardless for thoroughness I will do an ultrasound study of the kidney ureter bladder.  Lactic acidosis has resolved with IV fluids.  Not felt to represent sepsis at this time and may be due to hyperglycemia/DM itself.[name] Please folow up urine culture sent from ER. Rx with ceftraxone. S.p PO bactrim in ER X 1 Ceftriaxone 1 g IV daily  Obesity, Class III, BMI 40-49.9 (morbid obesity) (HCC)  Patient has BMI of 41. This complicates overall care and prognosis.   AKI (acute kidney injury) (HCC) Improving, repeat creatinine  was 1.15/eGFR 56 Patient's baseline sCr is 0.85-0.99/eGFR > 60 Continue sodium chloride infusion at 100 mL/h, for 1 more day Recheck BMP in the a.m.  Essential hypertension, benign Home  amlodipine 10 mg daily, carvedilol 25 mg p.o. twice daily, hydralazine 50 mg every 8 hours, furosemide 20 mg daily  Dyslipidemia associated with type 2 diabetes mellitus (HCC) Rosuvastatin 10 mg daily  CAD (coronary artery disease) Plavix, rosuvastatin, antihypertensive medication control, continue diabetes mellitus management  Abdominal distension Patient has had prior history of small bowel obstruction With patient endorsing constipation/abdominal discomfort similar to prior episodes of SBO Patient is status post enema on 08/07/2022, with moderate bowel movement yesterday Patient states she still feels that her abdomen is distended and uncomfortable like she has an SBO One-time KUB ordered, completed and pending read at the time of this dictation Message and signout with cross coverage provider, if KUB is not definitive, can consider a CT abdomen and pelvis with contrast Sodium chloride 100 mL/h, 1 day ordered  Lower urinary tract infectious disease Ceftriaxone IV 1 g daily, to complete 7-day course  Insulin dependent type 2 diabetes mellitus (HCC) Home long-acting insulin Semglee 70 units subcutaneous daily Insulin SSI with at bedtime coverage ordered  Uncontrolled type 2 diabetes mellitus with hyperglycemia (HCC) I intend to continue patient's Lantus as well as insulin sliding scale.  I will put in a routine request in the system for endocrinology consult for further help in controlling this patient's hyperglycemia  Hemiparesis affecting left side as late effect of cerebrovascular accident (CVA) (HCC) Home Plavix 75 mg daily, rosuvastatin 10 mg daily, antihypertensive medication, optimize diabetes mellitus management  Lightheadedness This is a chronic complaint of the patient when patient has hyperglycemia.  At this time I suspect patient was dehydrated at the time of her evaluation at home due to hyperglycemia.  Patient has received IV fluids, hyperglycemia has been better  controlled in the hospital.  I will check orthostatic vitals  DVT prophylaxis: Enoxaparin Code Status: Full code Family Communication: no Disposition Plan: Pending clinical course Level of care: Med-Surg  Consultants:  None at this time  Procedures:  None at this time  Antimicrobials: Ceftriaxone  Subjective:  Patient endorsing abdominal distention, discomfort similar with history of prior SBO.  Patient states she had large bowel movement yesterday however still feels distended.  Objective: Vitals:   08/08/22 0500 08/08/22 0800 08/08/22 1339 08/08/22 1930  BP:  (!) 145/68 137/88 (!) 149/84  Pulse:  79 79 79  Resp:  (!) 24 19 20   Temp:  97.7 F (36.5 C) 98.3 F (36.8 C) 98.4 F (36.9 C)  TempSrc:  Oral Oral Oral  SpO2:  100% 97% 96%  Weight: 115.5 kg     Height:        Intake/Output Summary (Last 24 hours) at 08/08/2022 2035 Last data filed at 08/08/2022 1300 Gross per 24 hour  Intake 1943.84 ml  Output 1700 ml  Net 243.84 ml   Filed Weights   08/06/22 0500 08/07/22 0455 08/08/22 0500  Weight: 111.7 kg 111 kg 115.5 kg   Physical examination:  General exam: Appears calm and comfortable  Respiratory system: Clear to auscultation. Respiratory effort normal. Cardiovascular system: S1 & S2 heard, RRR. No JVD, murmurs, rubs, gallops or clicks. No pedal edema. Gastrointestinal system: Obese abdomen with mild to moderate distention, soft and nontender. No organomegaly or masses felt. Normal bowel sounds heard. Central nervous system: Alert and oriented. No focal neurological deficits. Extremities: Symmetric 5 x 5  power. Skin: No rashes, lesions or ulcers Psychiatry: Judgement and insight appear normal. Mood & affect appropriate.   Data Reviewed: I have personally reviewed following labs and imaging studies  CBC: Recent Labs  Lab 08/03/22 1406 08/04/22 0641 08/08/22 0857  WBC 7.1 7.8 7.2  NEUTROABS 4.8  --   --   HGB 13.5 13.2 12.4  HCT 42.3 39.5 38.7  MCV  90.4 86.4 90.8  PLT 265 215 261   Basic Metabolic Panel: Recent Labs  Lab 08/03/22 1406 08/04/22 0641 08/06/22 0522 08/07/22 0627 08/08/22 0857  NA 133* 135 131* 134* 136  K 3.8 4.9 4.5 4.4 4.4  CL 101 104 100 99 105  CO2 22 19* 22 25 25   GLUCOSE 435* 374* 167* 267* 174*  BUN 15 17 42* 45* 35*  CREATININE 0.95 0.89 1.86* 1.48* 1.15*  CALCIUM 8.8* 9.2 8.9 9.1 9.0   GFR: Estimated Creatinine Clearance: 69.7 mL/min (A) (by C-G formula based on SCr of 1.15 mg/dL (H)).  Liver Function Tests: Recent Labs  Lab 08/03/22 1406  AST 16  ALT 27  ALKPHOS 99  BILITOT 0.5  PROT 7.7  ALBUMIN 3.8   Coagulation Profile: Recent Labs  Lab 08/04/22 0656  INR 1.1   CBG: Recent Labs  Lab 08/07/22 1643 08/07/22 2103 08/08/22 0800 08/08/22 1218 08/08/22 1623  GLUCAP 225* 169* 162* 120* 149*   Sepsis Labs: Recent Labs  Lab 08/03/22 1406 08/03/22 1735  LATICACIDVEN 2.6* 1.6   Recent Results (from the past 240 hour(s))  Urine Culture (for pregnant, neutropenic or urologic patients or patients with an indwelling urinary catheter)     Status: Abnormal   Collection Time: 08/03/22  2:09 PM   Specimen: Urine, Random  Result Value Ref Range Status   Specimen Description   Final    URINE, RANDOM Performed at [facility], [address], [place], [place] [address]    Special Requests   Final    NONE Performed at [facility], [address] [place], [place] [address]    Culture MULTIPLE SPECIES PRESENT, SUGGEST RECOLLECTION (A)  Final   Report Status 08/05/2022 FINAL  Final  Urine Culture     Status: None (Preliminary result)   Collection Time: 08/06/22 11:33 AM   Specimen: Urine, Random  Result Value Ref Range Status   Specimen Description   Final    URINE, RANDOM Performed at [facility], [address] [place], [place] [address]    Special Requests   Final    NONE Reflexed from [id number] Performed at [facility], [address] [place], [place] [address]    Culture   Final    CULTURE REINCUBATED FOR BETTER GROWTH Performed at [facility], [address], [place], [place] [address]    Report Status PENDING  Incomplete    Radiology Studies: Not indicated at this time  Scheduled Meds:  amitriptyline  50 mg Oral QHS   amLODipine  10 mg Oral Daily   carvedilol  25 mg Oral BID WC   clopidogrel  75 mg Oral Daily   cycloSPORINE  1 drop Both Eyes BID   DULoxetine  60 mg Oral Daily   enoxaparin (LOVENOX) injection  0.5 mg/kg Subcutaneous Q24H   fluticasone furoate-vilanterol  1 puff Inhalation Daily   furosemide  20 mg Oral Daily   gabapentin  1,200 mg Oral QHS   gabapentin  600 mg Oral BID   hydrALAZINE  50 mg Oral Q8H  insulin aspart  0-15 Units Subcutaneous TID WC   insulin aspart  0-5 Units Subcutaneous QHS   insulin aspart  35 Units Subcutaneous TID WC   insulin glargine-yfgn  70 Units Subcutaneous Daily   levETIRAcetam  500 mg Oral Daily   levETIRAcetam  750 mg Oral QHS   multivitamin with minerals  1 tablet Oral Daily   pantoprazole  20 mg Oral Daily   polyethylene glycol  17 g Oral BID   Ensure Max Protein  11 oz Oral BID   rosuvastatin  10 mg Oral Daily   senna  1 tablet Oral Daily   sodium chloride flush  3 mL Intravenous Q12H   Continuous Infusions:  sodium chloride     [START ON 08/09/2022] cefTRIAXone (ROCEPHIN)  IV      LOS: 2 days   Time spent: 50 mins  Dr. Sedalia Muta [facility]  If 7PM-7AM, please contact night-coverage  08/08/2022, 8:35 PM

## 2022-08-08 NOTE — Assessment & Plan Note (Addendum)
Plavix, rosuvastatin, antihypertensive medication control, continue diabetes mellitus management

## 2022-08-08 NOTE — Assessment & Plan Note (Signed)
Patient has BMI of 41. This complicates overall care and prognosis.

## 2022-08-08 NOTE — Assessment & Plan Note (Signed)
Rosuvastatin 10 mg daily

## 2022-08-08 NOTE — Assessment & Plan Note (Addendum)
Home Plavix 75 mg daily, rosuvastatin 10 mg daily, antihypertensive medication, optimize diabetes mellitus management

## 2022-08-08 NOTE — Assessment & Plan Note (Addendum)
Improving, repeat creatinine was 1.15/eGFR 56 Patient's baseline sCr is 0.85-0.99/eGFR > 60 Continue sodium chloride infusion at 100 mL/h, for 1 more day Recheck BMP in the a.m.

## 2022-08-09 DIAGNOSIS — N3000 Acute cystitis without hematuria: Secondary | ICD-10-CM | POA: Diagnosis not present

## 2022-08-09 LAB — BASIC METABOLIC PANEL
Anion gap: 6 (ref 5–15)
BUN: 24 mg/dL — ABNORMAL HIGH (ref 6–20)
CO2: 21 mmol/L — ABNORMAL LOW (ref 22–32)
Calcium: 8.6 mg/dL — ABNORMAL LOW (ref 8.9–10.3)
Chloride: 107 mmol/L (ref 98–111)
Creatinine, Ser: 0.86 mg/dL (ref 0.44–1.00)
GFR, Estimated: >60 mL/min (ref >60)
Glucose, Bld: 114 mg/dL — ABNORMAL HIGH (ref 70–99)
Potassium: 4.6 mmol/L (ref 3.5–5.1)
Sodium: 134 mmol/L — ABNORMAL LOW (ref 135–145)

## 2022-08-09 LAB — CBC
HCT: 37.3 % (ref 36.0–46.0)
Hemoglobin: 11.8 g/dL — ABNORMAL LOW (ref 12.0–15.0)
MCH: 29.4 pg (ref 26.0–34.0)
MCHC: 31.6 g/dL (ref 30.0–36.0)
MCV: 92.8 fL (ref 80.0–100.0)
Platelets: 214 10*3/uL (ref 150–400)
RBC: 4.02 MIL/uL (ref 3.87–5.11)
RDW: 14 % (ref 11.5–15.5)
WBC: 7.5 10*3/uL (ref 4.0–10.5)
nRBC: 0 % (ref 0.0–0.2)

## 2022-08-09 LAB — GLUCOSE, CAPILLARY
Glucose-Capillary: 92 mg/dL (ref 70–99)
Glucose-Capillary: 97 mg/dL (ref 70–99)
Glucose-Capillary: 201 mg/dL — ABNORMAL HIGH (ref 70–99)
Glucose-Capillary: 214 mg/dL — ABNORMAL HIGH (ref 70–99)
Glucose-Capillary: 170 mg/dL — ABNORMAL HIGH (ref 70–99)

## 2022-08-09 MED ORDER — INSULIN ASPART 100 UNIT/ML IJ SOLN
10.0000 [IU] | Freq: Three times a day (TID) | INTRAMUSCULAR | Status: DC
  Administered 2022-08-09 (×2): 10 [IU] via SUBCUTANEOUS
  Filled 2022-08-09 (×3): qty 1

## 2022-08-09 MED ORDER — INSULIN GLARGINE-YFGN 100 UNIT/ML ~~LOC~~ SOLN
20.0000 [IU] | Freq: Every day | SUBCUTANEOUS | Status: DC
  Administered 2022-08-10 – 2022-08-11 (×2): 20 [IU] via SUBCUTANEOUS
  Filled 2022-08-09 (×2): qty 0.2

## 2022-08-09 NOTE — Progress Notes (Signed)
Pt had 1 large watery BM today. Pt still felt distended, accompanied with nausea. MD was notified. MD was also informed of the pt's blood sugar since she was NPO and requires high insulin and CBG is dropping slowly. Insulin dose has been changed, diet was ordered and diabetic coordinator has been consulted.

## 2022-08-09 NOTE — Progress Notes (Signed)
PROGRESS NOTE    [name]  LOV:[id number] DOB: Jan 08, 1966 DOA: 08/03/2022 PCP: [name], [name]   No notes on file  Brief Narrative:   [name] is a 57 year old female with history of SBO, coronary artery disease, morbid obesity, GERD, heart failure preserved ejection fraction, uncontrolled diabetes mellitus, insulin-dependent diabetes mellitus, who presents emergency department for chief concerns of hyperglycemia.  08/03/2022: Admitted for UTI, uncontrolled diabetes mellitus  08/06/2022: Patient had acute kidney injury.  Patient was treated with gentle IV hydration, in and out, avoiding nephrotoxic agents.  7/19: Patient had a moderate to large bowel movement enema.  08/08/2022: Improving renal function, not back to baseline.  Continue gentle IV hydration.  Patient endorsing feeling of abdominal discomfort/abdominal distention consistent with history of SBO. KUB ordered and completed, pending read by radiology.  Messaged with cross coverage provider if KUB is not definitive, consider CT abdomen and pelvis to rule out small bowel obstruction  08/09/2022: The patient was seen and examined at bedside.  Endorses feeling weak due to being NPO.  Reports 2 watery stools today.  No significant nausea.  Started heart healthy carb modified diet.  Normoglycemic this morning.  Home insulin dose reduced to avoid hypoglycemia.  Assessment & Plan:   Principal Problem:   UTI (urinary tract infection) Active Problems:   Obesity, Class III, BMI 40-49.9 (morbid obesity) (HCC)   AKI (acute kidney injury) (HCC)   Essential hypertension, benign   GERD without esophagitis   Chronic diastolic CHF (congestive heart failure) (HCC)   Dyslipidemia associated with type 2 diabetes mellitus (HCC)   CAD (coronary artery disease)   Lightheadedness   Hemiparesis affecting left side as late effect of cerebrovascular accident (CVA) (HCC)   Uncontrolled type 2 diabetes mellitus with  hyperglycemia (HCC)   Insulin dependent type 2 diabetes mellitus (HCC)   Lower urinary tract infectious disease   Abdominal distension   Assessment and Plan: UTI (urinary tract infection) This has to be interpreted in the context of patient's urine culture from July 19, 2022 which grew E. coli with intermediate sensitivity to ciprofloxacin.  At the time of patient's discharge on July 22, 2022.  Patient was advised to stop taking ciprofloxacin and be treated with Keflex.  Unfortunately patient took ciprofloxacin for approximately 7 days and subsequent to that only started taking Keflex.  Unfortunately patient has failed this treatment and now has ongoing symptoms of dysuria.  With associated leukocytes in the urine.  As well as mild lactic acidosis.  Additional factors for failure in this patient would include her uncontrolled diabetes mellitus with hyperglycemia.  Regardless for thoroughness I will do an ultrasound study of the kidney ureter bladder.  Lactic acidosis has resolved with IV fluids.  Not felt to represent sepsis at this time and may be due to hyperglycemia/DM itself.[name] Please folow up urine culture sent from ER. Rx with ceftraxone. S.p PO bactrim in ER X 1 Ceftriaxone 1 g IV daily  Concern for ileus seen on CT scan done on 08/08/2022 Had a couple of bowel movements that were watery Started with solid diet Continue to monitor abdominal pain  Obesity, Class III, BMI 40-49.9 (morbid obesity) (HCC)  Patient has BMI of 41. This complicates overall care and prognosis.   AKI (acute kidney injury) (HCC) Improving, repeat creatinine was 1.15/eGFR 56 Patient's baseline sCr is 0.85-0.99/eGFR > 60 Continue sodium chloride infusion at 100 mL/h, for 1 more day Recheck BMP in the a.m.  Essential hypertension, benign Home  amlodipine 10 mg daily, carvedilol 25 mg p.o. twice daily, hydralazine 50 mg every 8 hours, furosemide 20 mg daily  Dyslipidemia associated with type 2 diabetes mellitus  (HCC) Rosuvastatin 10 mg daily  CAD (coronary artery disease) Plavix, rosuvastatin, antihypertensive medication control, continue diabetes mellitus management  Abdominal distension Patient has had prior history of small bowel obstruction With patient endorsing constipation/abdominal discomfort similar to prior episodes of SBO Patient is status post enema on 08/07/2022, with moderate bowel movement yesterday Patient states she still feels that her abdomen is distended and uncomfortable like she has an SBO One-time KUB ordered, completed and pending read at the time of this dictation Message and signout with cross coverage provider, if KUB is not definitive, can consider a CT abdomen and pelvis with contrast Sodium chloride 100 mL/h, 1 day ordered  Lower urinary tract infectious disease Ceftriaxone IV 1 g daily, to complete 7-day course  Insulin dependent type 2 diabetes mellitus (HCC) Home long-acting insulin Semglee 70 units subcutaneous daily Insulin SSI with at bedtime coverage ordered  Uncontrolled type 2 diabetes mellitus with hyperglycemia (HCC) Reduce insulin doses to avoid hypoglycemia Hemoglobin today 8A.  Today I will A1c.  9.7 on 07/20/22  Hemiparesis affecting left side as late effect of cerebrovascular accident (CVA) (HCC) Home Plavix 75 mg daily, rosuvastatin 10 mg daily, antihypertensive medication, optimize diabetes mellitus management  Lightheadedness This is a chronic complaint of the patient when patient has hyperglycemia.  At this time I suspect patient was dehydrated at the time of her evaluation at home due to hyperglycemia.  Patient has received IV fluids, hyperglycemia has been better controlled in the hospital.  I will check orthostatic vitals  Physical debility PT OT assessment Fall precautions.  Severe morbid obesity BMI 41 Recommend weight loss outpatient with regular physical activity and healthy diet.          DVT prophylaxis: Enoxaparin subcu  daily Code Status: Full code Family Communication: no Disposition Plan: Pending clinical course Level of care: Med-Surg  Consultants:  None at this time  Procedures:  None at this time  Antimicrobials: Ceftriaxone  Subjective:  Patient endorsing abdominal distention, discomfort similar with history of prior SBO.  Patient states she had large bowel movement yesterday however still feels distended.  Objective: Vitals:   08/08/22 1339 08/08/22 1930 08/09/22 0320 08/09/22 0751  BP: 137/88 (!) 149/84 112/66 128/79  Pulse: 79 79 78 76  Resp: 19 20 20 18   Temp: 98.3 F (36.8 C) 98.4 F (36.9 C) 98.4 F (36.9 C) 98 F (36.7 C)  TempSrc: Oral Oral Oral   SpO2: 97% 96% 96% 96%  Weight:   116 kg   Height:        Intake/Output Summary (Last 24 hours) at 08/09/2022 1251 Last data filed at 08/09/2022 1045 Gross per 24 hour  Intake 2166.41 ml  Output 1000 ml  Net 1166.41 ml   Filed Weights   08/07/22 0455 08/08/22 0500 08/09/22 0320  Weight: 111 kg 115.5 kg 116 kg   Physical examination:  General exam: Well-developed well-nourished in no acute distress. Respiratory system: Clear to auscultation. Respiratory effort normal. Cardiovascular system: Regular rate and rhythm no rubs or gallops. Gastrointestinal system: Obese with bowel sounds present.   Central nervous system: Alert and oriented. No focal neurological deficits. Extremities: Symmetric 5 x 5 power. Skin: No rashes, lesions or ulcers Psychiatry: Mood is appropriate for condition and setting.  Data Reviewed: I have personally reviewed following labs and imaging studies  CBC:  Recent Labs  Lab 08/03/22 1406 08/04/22 0641 08/08/22 0857 08/09/22 0534  WBC 7.1 7.8 7.2 7.5  NEUTROABS 4.8  --   --   --   HGB 13.5 13.2 12.4 11.8*  HCT 42.3 39.5 38.7 37.3  MCV 90.4 86.4 90.8 92.8  PLT 265 215 261 214   Basic Metabolic Panel: Recent Labs  Lab 08/04/22 0641 08/06/22 0522 08/07/22 0627 08/08/22 0857  08/09/22 0534  NA 135 131* 134* 136 134*  K 4.9 4.5 4.4 4.4 4.6  CL 104 100 99 105 107  CO2 19* 22 25 25  21*  GLUCOSE 374* 167* 267* 174* 114*  BUN 17 42* 45* 35* 24*  CREATININE 0.89 1.86* 1.48* 1.15* 0.86  CALCIUM 9.2 8.9 9.1 9.0 8.6*   GFR: Estimated Creatinine Clearance: 93.4 mL/min (by C-G formula based on SCr of 0.86 mg/dL).  Liver Function Tests: Recent Labs  Lab 08/03/22 1406  AST 16  ALT 27  ALKPHOS 99  BILITOT 0.5  PROT 7.7  ALBUMIN 3.8   Coagulation Profile: Recent Labs  Lab 08/04/22 0656  INR 1.1   CBG: Recent Labs  Lab 08/08/22 1623 08/08/22 2102 08/09/22 0750 08/09/22 1028 08/09/22 1201  GLUCAP 149* 119* 92 97 201*   Sepsis Labs: Recent Labs  Lab 08/03/22 1406 08/03/22 1735  LATICACIDVEN 2.6* 1.6   Recent Results (from the past 240 hour(s))  Urine Culture (for pregnant, neutropenic or urologic patients or patients with an indwelling urinary catheter)     Status: Abnormal   Collection Time: 08/03/22  2:09 PM   Specimen: Urine, Random  Result Value Ref Range Status   Specimen Description   Final    URINE, RANDOM Performed at [facility], [address], [place], [place] [address]    Special Requests   Final    NONE Performed at [facility], [address], [place], [place] [address]    Culture MULTIPLE SPECIES PRESENT, SUGGEST RECOLLECTION (A)  Final   Report Status 08/05/2022 FINAL  Final  Urine Culture     Status: Abnormal   Collection Time: 08/06/22 11:33 AM   Specimen: Urine, Random  Result Value Ref Range Status   Specimen Description   Final    URINE, RANDOM Performed at [facility], [address] [place], [place] [address]    Special Requests   Final    NONE Reflexed from [id number] Performed at [facility], [address] [place], [place] [address]    Culture 80,000 COLONIES/mL YEAST (A)  Final   Report Status 08/08/2022 FINAL  Final    Radiology Studies: Not indicated at this  time  Scheduled Meds:  amitriptyline  50 mg Oral QHS   amLODipine  10 mg Oral Daily   carvedilol  25 mg Oral BID WC   clopidogrel  75 mg Oral Daily   cycloSPORINE  1 drop Both Eyes BID   DULoxetine  60 mg Oral Daily   enoxaparin (LOVENOX) injection  0.5 mg/kg Subcutaneous Q24H   fluticasone furoate-vilanterol  1 puff Inhalation Daily   furosemide  20 mg Oral Daily   gabapentin  1,200 mg Oral QHS   gabapentin  600 mg Oral BID   hydrALAZINE  50 mg Oral Q8H   insulin aspart  0-15 Units Subcutaneous TID WC   insulin aspart  0-5 Units Subcutaneous QHS   insulin aspart  10 Units Subcutaneous TID WC   [START ON 08/10/2022] insulin glargine-yfgn  20 Units Subcutaneous Daily   levETIRAcetam  500 mg Oral Daily   levETIRAcetam  750 mg Oral QHS   multivitamin with minerals  1 tablet Oral Daily   pantoprazole  20 mg Oral Daily   polyethylene glycol  17 g Oral BID   Ensure Max Protein  11 oz Oral BID   rosuvastatin  10 mg Oral Daily   senna  1 tablet Oral Daily   sodium chloride flush  3 mL Intravenous Q12H   Continuous Infusions:  sodium chloride 30 mL/hr at 08/09/22 1045   cefTRIAXone (ROCEPHIN)  IV Stopped (08/08/22 2116)   promethazine (PHENERGAN) injection (IM or IVPB)      LOS: 3 days   Time spent: 50 mins  Dr. Sedalia Muta [facility]  If 7PM-7AM, please contact night-coverage  08/09/2022, 12:51 PM

## 2022-08-09 NOTE — Progress Notes (Signed)
Mobility Specialist - Progress Note     08/09/22 1347  Mobility  Activity Ambulated with assistance in hallway  Level of Assistance Standby assist, set-up cues, supervision of patient - no hands on  Assistive Device Front wheel walker  Distance Ambulated (ft) 80 ft  Range of Motion/Exercises Active  Activity Response Tolerated well  Mobility Referral Yes  $Mobility charge 1 Mobility  Mobility Specialist Start Time (ACUTE ONLY) 1330  Mobility Specialist Stop Time (ACUTE ONLY) 1347  Mobility Specialist Time Calculation (min) (ACUTE ONLY) 17 min   Pt resting in bed on RA upon entry. Pt STS and ambulates to hallway around NS with RW SBA. Pt returned to bed and left with needs in reach.   Johnathan Hausen Mobility Specialist 08/09/22, 1:48 PM

## 2022-08-10 DIAGNOSIS — N3000 Acute cystitis without hematuria: Secondary | ICD-10-CM | POA: Diagnosis not present

## 2022-08-10 LAB — GLUCOSE, CAPILLARY
Glucose-Capillary: 154 mg/dL — ABNORMAL HIGH (ref 70–99)
Glucose-Capillary: 195 mg/dL — ABNORMAL HIGH (ref 70–99)
Glucose-Capillary: 170 mg/dL — ABNORMAL HIGH (ref 70–99)
Glucose-Capillary: 217 mg/dL — ABNORMAL HIGH (ref 70–99)

## 2022-08-10 LAB — CREATININE, SERUM
Creatinine, Ser: 0.88 mg/dL (ref 0.44–1.00)
GFR, Estimated: >60 mL/min (ref >60)

## 2022-08-10 MED ORDER — ONDANSETRON 4 MG PO TBDP
8.0000 mg | ORAL_TABLET | Freq: Three times a day (TID) | ORAL | Status: DC | PRN
  Administered 2022-08-10: 8 mg via ORAL
  Filled 2022-08-10: qty 2

## 2022-08-10 MED ORDER — INSULIN ASPART 100 UNIT/ML IJ SOLN
5.0000 [IU] | Freq: Three times a day (TID) | INTRAMUSCULAR | Status: DC
  Administered 2022-08-10 – 2022-08-11 (×4): 5 [IU] via SUBCUTANEOUS
  Filled 2022-08-10 (×4): qty 1

## 2022-08-10 NOTE — Progress Notes (Addendum)
Physical Therapy Treatment Patient Details Name: [name] MRN: [id number] DOB: September 15, 1965 Today's Date: 08/10/2022   History of Present Illness Pt is a 57 y/o female who presented to the ED with UTI and generalized weakness. PMH includes COPD, DM, HTN, GAD, UTI, OSA on CPAP, CVA, CKS, diastolic CHF, HLD, seizures, L eye blindness. She was recently d/c on 7/3 for a UTI.    PT Comments  Pt presents laying in bed, no complaints of pain. She notes that she is feeling better today. Pt able to perform supine>sit ModI and sit<> stand with supervision for safety. She was able to walk ~161ft with 2-3 self selected standing rest breaks, RW, and CGA. Pt overall tolerated treatment well and did not experience any lightheadedness/ dizziness/ or nausea throughout treatment. Pt would benefit from continued skilled therapy to maximize functional independence.     Assistance Recommended at Discharge PRN  If plan is discharge home, recommend the following:  Can travel by private vehicle    Assist for transportation;Help with stairs or ramp for entrance;A little help with bathing/dressing/bathroom;Assistance with cooking/housework;A little help with walking and/or transfers;Direct supervision/assist for medications management      Equipment Recommendations  Rolling walker (2 wheels)    Recommendations for Other Services       Precautions / Restrictions Precautions Precautions: Fall Precaution Comments: Lightheaded/ dizziness frequency has decreased since last visit. Restrictions Weight Bearing Restrictions: No     Mobility  Bed Mobility Overal bed mobility: Needs Assistance Bed Mobility: Supine to Sit     Supine to sit: Modified independent (Device/Increase time)     General bed mobility comments: increased time    Transfers Overall transfer level: Needs assistance Equipment used: Rolling walker (2 wheels) Transfers: Sit to/from Stand Sit to Stand: Supervision                 Ambulation/Gait Ambulation/Gait assistance: Min guard Gait Distance (Feet): 180 Feet Assistive device: Rolling walker (2 wheels) Gait Pattern/deviations: Decreased stride length Gait velocity: slowed     General Gait Details: 2-3 self selected standing rest breaks   Stairs             Wheelchair Mobility     Tilt Bed    Modified Rankin (Stroke Patients Only)       Balance Overall balance assessment: No apparent balance deficits (not formally assessed) Sitting-balance support: No upper extremity supported, Feet unsupported Sitting balance-Leahy Scale: Good     Standing balance support: Bilateral upper extremity supported, During functional activity, Reliant on assistive device for balance Standing balance-Leahy Scale: Fair                              Cognition Arousal/Alertness: Awake/alert Behavior During Therapy: WFL for tasks assessed/performed Overall Cognitive Status: Within Functional Limits for tasks assessed                                          Exercises General Exercises - Lower Extremity Long Arc Quad: Both, 20 reps, Strengthening, Seated Hip Flexion/Marching: AROM, Both, 10 reps, Seated    General Comments General comments (skin integrity, edema, etc.): No lightheaded/ dizziness/ nausea noted throughout treatment, only had some this morning when waking up      Pertinent Vitals/Pain Pain Assessment Pain Assessment: No/denies pain    Home Living  Prior Function            PT Goals (current goals can now be found in the care plan section) Acute Rehab PT Goals Patient Stated Goal: Return home PT Goal Formulation: With patient Time For Goal Achievement: 08/17/22 Potential to Achieve Goals: Good Progress towards PT goals: Progressing toward goals    Frequency    Min 1X/week      PT Plan Current plan remains appropriate    Co-evaluation               AM-PAC PT "6 Clicks" Mobility   Outcome Measure  Help needed turning from your back to your side while in a flat bed without using bedrails?: None Help needed moving from lying on your back to sitting on the side of a flat bed without using bedrails?: None Help needed moving to and from a bed to a chair (including a wheelchair)?: A Little Help needed standing up from a chair using your arms (e.g., wheelchair or bedside chair)?: A Little Help needed to walk in hospital room?: A Little Help needed climbing 3-5 steps with a railing? : A Little 6 Click Score: 20    End of Session Equipment Utilized During Treatment: Gait belt Activity Tolerance: Patient tolerated treatment well Patient left: in chair;with call bell/phone within reach;with chair alarm set   PT Visit Diagnosis: Dizziness and giddiness (R42);Difficulty in walking, not elsewhere classified (R26.2);Muscle weakness (generalized) (M62.81);Unsteadiness on feet (R26.81)     Time: 1040-1055 PT Time Calculation (min) (ACUTE ONLY): 15 min  Charges:    $Therapeutic Activity: 8-22 mins PT General Charges $$ ACUTE PT VISIT: 1 Visit                     Payton Prinsen, PT, SPT 11:19 AM,08/10/22

## 2022-08-10 NOTE — Inpatient Diabetes Management (Signed)
Inpatient Diabetes Program Recommendations  AACE/ADA: New Consensus Statement on Inpatient Glycemic Control (2015)  Target Ranges:  Prepandial:   less than 140 mg/dL      Peak postprandial:   less than 180 mg/dL (1-2 hours)      Critically ill patients:  140 - 180 mg/dL   Lab Results  Component Value Date   GLUCAP 154 (H) 08/10/2022   HGBA1C 9.7 (H) 07/20/2022    Review of Glycemic Control  Latest Reference Range & Units 08/09/22 07:50 08/09/22 10:28 08/09/22 12:01 08/09/22 16:37 08/09/22 21:02 08/10/22 07:59  Glucose-Capillary 70 - 99 mg/dL 92 97 102 (H) 725 (H) 366 (H) 154 (H)   Diabetes history: DM 2 Outpatient Diabetes medications:  Toujeo 70 units at bedtime Novolog up to 30 units TID Trulicity?  Current orders for Inpatient glycemic control:  Novolog 0-15 units tid with meals and HS Novolog 5 units tid with meals Semglee 20 units daily  Inpatient Diabetes Program Recommendations:    Note insulin needs have reduced significantly.  Agree with current orders.  Will follow.   Thanks,  Beryl Meager, RN, BC-ADM Inpatient Diabetes Coordinator Pager [phone]  (8a-5p)

## 2022-08-10 NOTE — TOC Progression Note (Signed)
Transition of Care [facility]) - Progression Note    Patient Details  Name: Daveigh Batty MRN: [id number] Date of Birth: January 28, 1965  [facility] of [facility]) CM/SW Contact  Margarito Liner, LCSW Phone Number: 08/10/2022, 12:28 PM  Clinical Narrative:  CSW will continue to follow for discharge needs.  Expected Discharge Plan: Home w Home Health Services Barriers to Discharge: Continued Medical Work up  Expected Discharge Plan and Services     Post Acute Care Choice: Durable Medical Equipment, Home Health Living arrangements for the past 2 months: Single Family Home                                       Social Determinants of Health (SDOH) Interventions SDOH Screenings   Food Insecurity: No Food Insecurity (08/03/2022)  Recent Concern: Food Insecurity - Food Insecurity Present (06/28/2022)  Housing: Patient Declined (08/03/2022)  Transportation Needs: No Transportation Needs (08/03/2022)  Utilities: Not At Risk (08/03/2022)  Alcohol Screen: Low Risk  (03/26/2022)  Depression (PHQ2-9): Low Risk  (06/29/2022)  Financial Resource Strain: Medium Risk (06/28/2022)  Physical Activity: Insufficiently Active (06/28/2022)  Social Connections: Moderately Integrated (06/28/2022)  Stress: No Stress Concern Present (06/28/2022)  Tobacco Use: Medium Risk (08/04/2022)    Readmission Risk Interventions    06/11/2021   11:43 AM 05/19/2021   12:02 PM 04/24/2021    3:02 PM  Readmission Risk Prevention Plan  Transportation Screening Complete Complete Complete  PCP or Specialist Appt within 5-7 Days  Complete   Home Care Screening  Complete   Medication Review (RN CM)  Complete   HRI or Home Care Consult   Complete  Social Work Consult for Recovery Care Planning/Counseling   --  Palliative Care Screening   Not Applicable  Medication Review Oceanographer) Complete  Complete  PCP or Specialist appointment within 3-5 days of discharge Complete    HRI or Home Care Consult Complete    SW Recovery  Care/Counseling Consult Complete    Palliative Care Screening Not Applicable    Skilled Nursing Facility Not Applicable

## 2022-08-10 NOTE — Care Management Important Message (Signed)
Important Message  Patient Details  Name: [name] MRN: [id number] Date of Birth: 07/10/1965   Medicare Important Message Given:  Yes     Johnell Comings 08/10/2022, 11:59 AM

## 2022-08-10 NOTE — Progress Notes (Signed)
OT Cancellation Note  Patient Details Name: [name] MRN: [id number] DOB: 10/03/1965   Cancelled Treatment:    Reason Eval/Treat Not Completed: Patient declined, no reason specified. Upon attempts x2 pt declining participation with therapy despite encouragement. Will re-attempt at later date/time as appropriate.   Arman Filter., MPH, MS, OTR/L ascom [phone] 08/10/22, 2:17 PM

## 2022-08-10 NOTE — Progress Notes (Addendum)
PROGRESS NOTE    [name]  ZOX:[id number] DOB: 1965-06-19 DOA: 08/03/2022 PCP: [name], [name]   No notes on file  Brief Narrative:   [name] is a 57 year old female with history of SBO, coronary artery disease, morbid obesity, GERD, heart failure preserved ejection fraction, uncontrolled diabetes mellitus, insulin-dependent diabetes mellitus, who presents emergency department for chief concerns of hyperglycemia.  08/03/2022: Admitted for UTI, uncontrolled diabetes mellitus  08/06/2022: Patient had acute kidney injury.  Patient was treated with gentle IV hydration, in and out, avoiding nephrotoxic agents.  7/19: Patient had a moderate to large bowel movement enema.  08/08/2022: Improving renal function, not back to baseline.  Continue gentle IV hydration.  Patient endorsing feeling of abdominal discomfort/abdominal distention consistent with history of SBO. KUB ordered and completed, pending read by radiology.  Messaged with cross coverage provider if KUB is not definitive, consider CT abdomen and pelvis to rule out small bowel obstruction  08/09/2022: The patient was seen and examined at bedside.  Endorses feeling weak due to being NPO.  Reports 2 watery stools today.  No significant nausea.  Started heart healthy carb modified diet.  Normoglycemic this morning.  Home insulin dose reduced to avoid hypoglycemia.  08/10/22: Endorses feeling nauseous after eating.  Advised to eat small meals throughout the day due to concern for possible gastroparesis.  Assessment & Plan:   Principal Problem:   UTI (urinary tract infection) Active Problems:   Obesity, Class III, BMI 40-49.9 (morbid obesity) (HCC)   AKI (acute kidney injury) (HCC)   Essential hypertension, benign   GERD without esophagitis   Chronic diastolic CHF (congestive heart failure) (HCC)   Dyslipidemia associated with type 2 diabetes mellitus (HCC)   CAD (coronary artery disease)   Lightheadedness    Hemiparesis affecting left side as late effect of cerebrovascular accident (CVA) (HCC)   Uncontrolled type 2 diabetes mellitus with hyperglycemia (HCC)   Insulin dependent type 2 diabetes mellitus (HCC)   Lower urinary tract infectious disease   Abdominal distension   Assessment and Plan: UTI (urinary tract infection) This has to be interpreted in the context of patient's urine culture from July 19, 2022 which grew E. coli with intermediate sensitivity to ciprofloxacin.  At the time of patient's discharge on July 22, 2022.  Patient was advised to stop taking ciprofloxacin and be treated with Keflex.  Unfortunately patient took ciprofloxacin for approximately 7 days and subsequent to that only started taking Keflex.  Unfortunately patient has failed this treatment and now has ongoing symptoms of dysuria.  With associated leukocytes in the urine.  As well as mild lactic acidosis.  Additional factors for failure in this patient would include her uncontrolled diabetes mellitus with hyperglycemia.  Regardless for thoroughness I will do an ultrasound study of the kidney ureter bladder.  Lactic acidosis has resolved with IV fluids.  Not felt to represent sepsis at this time and may be due to hyperglycemia/DM itself.[name] Please folow up urine culture sent from ER. Rx with ceftraxone. S.p PO bactrim in ER X 1 Ceftriaxone 1 g IV daily, DC'd on 08/10/2022.  Concern for ileus seen on CT scan done on 08/08/2022 Possible gastroparesis Had a couple of bowel movements that were watery Started with solid diet Continue to monitor abdominal pain Small meals throughout the day Mobilize as tolerated  Obesity, Class III, BMI 40-49.9 (morbid obesity) (HCC)  Patient has BMI of 41. This complicates overall care and prognosis.   AKI (acute kidney injury) (  HCC) Improving, repeat creatinine was 1.15/eGFR 56 Patient's baseline sCr is 0.85-0.99/eGFR > 60 Continue sodium chloride infusion at 100 mL/h, for 1 more day Recheck  BMP in the a.m.  Essential hypertension, benign Home amlodipine 10 mg daily, carvedilol 25 mg p.o. twice daily, hydralazine 50 mg every 8 hours, furosemide 20 mg daily  Dyslipidemia associated with type 2 diabetes mellitus (HCC) Rosuvastatin 10 mg daily  CAD (coronary artery disease) Plavix, rosuvastatin, antihypertensive medication control, continue diabetes mellitus management  Abdominal distension Patient has had prior history of small bowel obstruction With patient endorsing constipation/abdominal discomfort similar to prior episodes of SBO Patient is status post enema on 08/07/2022, with moderate bowel movement yesterday Patient states she still feels that her abdomen is distended and uncomfortable like she has an SBO One-time KUB ordered, completed and pending read at the time of this dictation Message and signout with cross coverage provider, if KUB is not definitive, can consider a CT abdomen and pelvis with contrast Sodium chloride 100 mL/h, 1 day ordered  Lower urinary tract infectious disease Ceftriaxone IV 1 g daily, to complete 7-day course  Insulin dependent type 2 diabetes mellitus (HCC) Home long-acting insulin Semglee 70 units subcutaneous daily Insulin SSI with at bedtime coverage ordered  Uncontrolled type 2 diabetes mellitus with hyperglycemia (HCC) Reduce insulin doses to avoid hypoglycemia Hemoglobin today 8A.  Today I will A1c.  9.7 on 07/20/22  Hemiparesis affecting left side as late effect of cerebrovascular accident (CVA) (HCC) Home Plavix 75 mg daily, rosuvastatin 10 mg daily, antihypertensive medication, optimize diabetes mellitus management  Lightheadedness This is a chronic complaint of the patient when patient has hyperglycemia.  At this time I suspect patient was dehydrated at the time of her evaluation at home due to hyperglycemia.  Patient has received IV fluids, hyperglycemia has been better controlled in the hospital.  I will check orthostatic  vitals  Physical debility PT OT assessment Fall precautions.  Severe morbid obesity BMI 41 Recommend weight loss outpatient with regular physical activity and healthy diet.          DVT prophylaxis: Enoxaparin subcu daily Code Status: Full code Family Communication: no Disposition Plan: Pending clinical course Level of care: Med-Surg  Consultants:  None at this time  Procedures:  None at this time  Antimicrobials: Ceftriaxone  Subjective:  Patient endorsing abdominal distention, discomfort similar with history of prior SBO.  Patient states she had large bowel movement yesterday however still feels distended.  Objective: Vitals:   08/09/22 1937 08/10/22 0325 08/10/22 0421 08/10/22 0907  BP: (!) 157/80 121/64  136/78  Pulse: 74 82  77  Resp: 20 20  18   Temp: 98.3 F (36.8 C) 98.5 F (36.9 C)  98 F (36.7 C)  TempSrc: Oral Oral    SpO2: 96% 94%  99%  Weight:   116 kg   Height:        Intake/Output Summary (Last 24 hours) at 08/10/2022 1526 Last data filed at 08/10/2022 1253 Gross per 24 hour  Intake 570.5 ml  Output 1900 ml  Net -1329.5 ml   Filed Weights   08/08/22 0500 08/09/22 0320 08/10/22 0421  Weight: 115.5 kg 116 kg 116 kg   Physical examination:  General exam: Well-developed well-nourished in no acute distress. Respiratory system: Clear to auscultation with no wheezes or rales. Cardiovascular system: Regular rate and rhythm no rubs or gallops. Gastrointestinal system: Obese with bowel sounds present.   Central nervous system: Alert and oriented. No focal neurological deficits.  Extremities: Symmetric 5 x 5 power. Skin: No rashes, lesions or ulcers Psychiatry: Mood is appropriate for condition and setting.  Data Reviewed: I have personally reviewed following labs and imaging studies  CBC: Recent Labs  Lab 08/04/22 0641 08/08/22 0857 08/09/22 0534  WBC 7.8 7.2 7.5  HGB 13.2 12.4 11.8*  HCT 39.5 38.7 37.3  MCV 86.4 90.8 92.8  PLT  215 261 214   Basic Metabolic Panel: Recent Labs  Lab 08/04/22 0641 08/06/22 0522 08/07/22 0627 08/08/22 0857 08/09/22 0534 08/10/22 0459  NA 135 131* 134* 136 134*  --   K 4.9 4.5 4.4 4.4 4.6  --   CL 104 100 99 105 107  --   CO2 19* 22 25 25  21*  --   GLUCOSE 374* 167* 267* 174* 114*  --   BUN 17 42* 45* 35* 24*  --   CREATININE 0.89 1.86* 1.48* 1.15* 0.86 0.88  CALCIUM 9.2 8.9 9.1 9.0 8.6*  --    GFR: Estimated Creatinine Clearance: 91.3 mL/min (by C-G formula based on SCr of 0.88 mg/dL).  Liver Function Tests: No results for input(s): "AST", "ALT", "ALKPHOS", "BILITOT", "PROT", "ALBUMIN" in the last 168 hours.  Coagulation Profile: Recent Labs  Lab 08/04/22 0656  INR 1.1   CBG: Recent Labs  Lab 08/09/22 1201 08/09/22 1637 08/09/22 2102 08/10/22 0759 08/10/22 1155  GLUCAP 201* 214* 170* 154* 195*   Sepsis Labs: Recent Labs  Lab 08/03/22 1735  LATICACIDVEN 1.6   Recent Results (from the past 240 hour(s))  Urine Culture (for pregnant, neutropenic or urologic patients or patients with an indwelling urinary catheter)     Status: Abnormal   Collection Time: 08/03/22  2:09 PM   Specimen: Urine, Random  Result Value Ref Range Status   Specimen Description   Final    URINE, RANDOM Performed at [facility], [address] [place], [place] [address]    Special Requests   Final    NONE Performed at [facility], [address] [place], [place] [address]    Culture MULTIPLE SPECIES PRESENT, SUGGEST RECOLLECTION (A)  Final   Report Status 08/05/2022 FINAL  Final  Urine Culture     Status: Abnormal   Collection Time: 08/06/22 11:33 AM   Specimen: Urine, Random  Result Value Ref Range Status   Specimen Description   Final    URINE, RANDOM Performed at [facility], [address] [place], [place] [address]    Special Requests   Final    NONE Reflexed from [phone] Performed at [facility], [address]  [place], [place] [address]    Culture 80,000 COLONIES/mL YEAST (A)  Final   Report Status 08/08/2022 FINAL  Final    Radiology Studies: Not indicated at this time  Scheduled Meds:  amitriptyline  50 mg Oral QHS   amLODipine  10 mg Oral Daily   carvedilol  25 mg Oral BID WC   clopidogrel  75 mg Oral Daily   cycloSPORINE  1 drop Both Eyes BID   DULoxetine  60 mg Oral Daily   enoxaparin (LOVENOX) injection  0.5 mg/kg Subcutaneous Q24H   fluticasone furoate-vilanterol  1 puff Inhalation Daily   furosemide  20 mg Oral Daily   gabapentin  1,200 mg Oral QHS   gabapentin  600 mg Oral BID   hydrALAZINE  50 mg Oral Q8H   insulin aspart  0-15 Units Subcutaneous TID WC   insulin aspart  0-5 Units Subcutaneous  QHS   insulin aspart  5 Units Subcutaneous TID WC   insulin glargine-yfgn  20 Units Subcutaneous Daily   levETIRAcetam  500 mg Oral Daily   levETIRAcetam  750 mg Oral QHS   multivitamin with minerals  1 tablet Oral Daily   pantoprazole  20 mg Oral Daily   polyethylene glycol  17 g Oral BID   Ensure Max Protein  11 oz Oral BID   rosuvastatin  10 mg Oral Daily   senna  1 tablet Oral Daily   sodium chloride flush  3 mL Intravenous Q12H   Continuous Infusions:  cefTRIAXone (ROCEPHIN)  IV Stopped (08/09/22 2243)   promethazine (PHENERGAN) injection (IM or IVPB)      LOS: 4 days   Time spent: 50 mins  Dr. Sedalia Muta [facility]  If 7PM-7AM, please contact night-coverage  08/10/2022, 3:26 PM

## 2022-08-11 DIAGNOSIS — N3000 Acute cystitis without hematuria: Secondary | ICD-10-CM | POA: Diagnosis not present

## 2022-08-11 LAB — GLUCOSE, CAPILLARY
Glucose-Capillary: 201 mg/dL — ABNORMAL HIGH (ref 70–99)
Glucose-Capillary: 204 mg/dL — ABNORMAL HIGH (ref 70–99)

## 2022-08-11 MED ORDER — TOUJEO MAX SOLOSTAR 300 UNIT/ML ~~LOC~~ SOPN
PEN_INJECTOR | SUBCUTANEOUS | 0 refills | Status: DC
Start: 2022-08-11 — End: 2022-09-07

## 2022-08-11 MED ORDER — INSULIN LISPRO (1 UNIT DIAL) 100 UNIT/ML (KWIKPEN)
PEN_INJECTOR | SUBCUTANEOUS | 0 refills | Status: DC
Start: 2022-08-11 — End: 2022-09-07

## 2022-08-11 NOTE — Progress Notes (Signed)
Mobility Specialist - Progress Note   08/11/22 1029  Mobility  Activity Ambulated with assistance in room  Level of Assistance Standby assist, set-up cues, supervision of patient - no hands on  Assistive Device None  Distance Ambulated (ft) 24 ft  Activity Response Tolerated well  $Mobility charge 1 Mobility  Mobility Specialist Start Time (ACUTE ONLY) 1020  Mobility Specialist Stop Time (ACUTE ONLY) 1027  Mobility Specialist Time Calculation (min) (ACUTE ONLY) 7 min   Pt sitting up in bed upon entry, utilizing RA. Pt agreeable to OOB amb however opted to stay within the room while awaiting d/c. Pt transferred to the EOB and STS indep. Pt amb to the door and back x2 MinG without AD-- occasionally reaching out to hold onto objects.  Pt left sitting up in bed with needs within reach.   Zetta Bills Mobility Specialist 08/11/22 10:33 AM

## 2022-08-11 NOTE — TOC Transition Note (Signed)
Transition of Care [facility]) - CM/SW Discharge Note   Patient Details  Name: [name] MRN: [id number] Date of Birth: [birth year]  [facility] of [facility]) CM/SW Contact:  Margarito Liner, LCSW Phone Number: 08/11/2022, 11:35 AM   Clinical Narrative:  Patient has orders to discharge home today. [name] liaison is aware. No further concerns. CSW signing off.   Final next level of care: Home w Home Health Services Barriers to Discharge: Barriers Resolved   Patient Goals and CMS Choice   Choice offered to / list presented to : Patient  Discharge Placement                  Patient to be transferred to facility by: Son-in-law will pick her up or daughter will arrange [facility], per conversation with daughter on 7/16.   Patient and family notified of of transfer: 08/11/22  Discharge Plan and Services Additional resources added to the After Visit Summary for       Post Acute Care Choice: Durable Medical Equipment, Home Health          DME Arranged: Dan Humphreys rolling DME Agency: [employer] Date DME Agency Contacted: 08/05/22   Representative spoke with at DME Agency: Yvone Neu HH Arranged: PT, OT [facility] Agency: [facility] Date [facility] Agency Contacted: 08/11/22   Representative spoke with at [facility] Agency: Chucky May  Social Determinants of Health (SDOH) Interventions SDOH Screenings   Food Insecurity: No Food Insecurity (08/03/2022)  Recent Concern: Food Insecurity - Food Insecurity Present (06/28/2022)  Housing: Patient Declined (08/03/2022)  Transportation Needs: No Transportation Needs (08/03/2022)  Utilities: Not At Risk (08/03/2022)  Alcohol Screen: Low Risk  (03/26/2022)  Depression (PHQ2-9): Low Risk  (06/29/2022)  Financial Resource Strain: Medium Risk (06/28/2022)  Physical Activity: Insufficiently Active (06/28/2022)  Social Connections: Moderately Integrated (06/28/2022)  Stress: No Stress Concern Present (06/28/2022)  Tobacco Use: Medium Risk (08/04/2022)      Readmission Risk Interventions    06/11/2021   11:43 AM 05/19/2021   12:02 PM 04/24/2021    3:02 PM  Readmission Risk Prevention Plan  Transportation Screening Complete Complete Complete  PCP or Specialist Appt within 5-7 Days  Complete   Home Care Screening  Complete   Medication Review (RN CM)  Complete   HRI or Home Care Consult   Complete  Social Work Consult for Recovery Care Planning/Counseling   --  Palliative Care Screening   Not Applicable  Medication Review [facility]) Complete  Complete  PCP or Specialist appointment within 3-5 days of discharge Complete    HRI or Home Care Consult Complete    SW Recovery Care/Counseling Consult Complete    Palliative Care Screening Not Applicable    Skilled Nursing Facility Not Applicable

## 2022-08-11 NOTE — Plan of Care (Signed)

## 2022-08-11 NOTE — Progress Notes (Signed)
Occupational Therapy Treatment Patient Details Name: [name] MRN: [id number] DOB: May 28, 1965 Today's Date: 08/11/2022   History of present illness Pt is a 57 y/o female who presented to the ED with UTI and generalized weakness. PMH includes COPD, DM, HTN, GAD, UTI, OSA on CPAP, CVA, CKS, diastolic CHF, HLD, seizures, L eye blindness. She was recently d/c on 7/3 for a UTI.   OT comments  Pt seen for brief OT tx. Pt mod indep with mobility in room, ADL, and pulling together her belongings. Pt has met all OT goals. Will d/c from acute OT services.    Recommendations for follow up therapy are one component of a multi-disciplinary discharge planning process, led by the attending physician.  Recommendations may be updated based on patient status, additional functional criteria and insurance authorization.    Assistance Recommended at Discharge Intermittent Supervision/Assistance  Patient can return home with the following  A little help with walking and/or transfers;A little help with bathing/dressing/bathroom;Assist for transportation;Assistance with cooking/housework;Help with stairs or ramp for entrance   Equipment Recommendations  None recommended by OT    Recommendations for Other Services      Precautions / Restrictions Precautions Precautions: Fall Restrictions Weight Bearing Restrictions: No       Mobility Bed Mobility Overal bed mobility: Modified Independent                  Transfers Overall transfer level: Modified independent                       Balance Overall balance assessment: Mild deficits observed, not formally tested                                         ADL either performed or assessed with clinical judgement   ADL Overall ADL's : Modified independent                                       General ADL Comments: Pt mod indep wtih dressing    Extremity/Trunk Assessment               Vision       Perception     Praxis      Cognition Arousal/Alertness: Awake/alert Behavior During Therapy: WFL for tasks assessed/performed Overall Cognitive Status: Within Functional Limits for tasks assessed                                          Exercises      Shoulder Instructions       General Comments      Pertinent Vitals/ Pain       Pain Assessment Pain Assessment: No/denies pain  Home Living                                          Prior Functioning/Environment              Frequency           Progress Toward Goals  OT Goals(current goals can now be found in the care plan section)  Progress towards OT goals: Goals met/education completed, patient discharged from OT  Acute Rehab OT Goals Patient Stated Goal: live independently OT Goal Formulation: All assessment and education complete, DC therapy  Plan All goals met and education completed, patient discharged from OT services    Co-evaluation                 AM-PAC OT "6 Clicks" Daily Activity     Outcome Measure   Help from another person eating meals?: None Help from another person taking care of personal grooming?: None Help from another person toileting, which includes using toliet, bedpan, or urinal?: None Help from another person bathing (including washing, rinsing, drying)?: None Help from another person to put on and taking off regular upper body clothing?: None Help from another person to put on and taking off regular lower body clothing?: None 6 Click Score: 24    End of Session    OT Visit Diagnosis: Other abnormalities of gait and mobility (R26.89);Muscle weakness (generalized) (M62.81)   Activity Tolerance Patient tolerated treatment well   Patient Left in bed;with call bell/phone within reach   Nurse Communication          Time: 1610-9604 OT Time Calculation (min): 8 min  Charges: OT General Charges $OT Visit: 1  Visit OT Treatments $Self Care/Home Management : 8-22 mins  Arman Filter., MPH, MS, OTR/L ascom [phone] 08/11/22, 11:19 AM

## 2022-08-11 NOTE — Discharge Summary (Signed)
Discharge Summary  [name] ZOX:[id number] DOB: [birth year]  PCP: [name], [name]  Admit date: 08/03/2022 Discharge date: 08/11/2022  Time spent: 35 minutes   Recommendations for Outpatient Follow-up:  Follow up with your PCP   Discharge Diagnoses:  Active Hospital Problems   Diagnosis Date Noted   UTI (urinary tract infection) 07/07/2021   Obesity, Class III, BMI 40-49.9 (morbid obesity) (HCC) 05/10/2011    Priority: Low   AKI (acute kidney injury) (HCC) 04/23/2021    Priority: 3.   Essential hypertension, benign 01/21/2014    Priority: 5.   Chronic diastolic CHF (congestive heart failure) (HCC) 04/23/2021    Priority: 6.   GERD without esophagitis 04/23/2021    Priority: 6.   Dyslipidemia associated with type 2 diabetes mellitus (HCC) 01/21/2014    Priority: 7.   CAD (coronary artery disease) 04/27/2019    Priority: 9.   Abdominal distension 08/08/2022   Lower urinary tract infectious disease 08/05/2022   Uncontrolled type 2 diabetes mellitus with hyperglycemia (HCC) 11/17/2021   Insulin dependent type 2 diabetes mellitus (HCC) 11/17/2021   Hemiparesis affecting left side as late effect of cerebrovascular accident (CVA) (HCC) 09/03/2014   Lightheadedness 10/12/2006    Resolved Hospital Problems  No resolved problems to display.    Discharge Condition: Stable   Diet recommendation: Resume previous diet   Vitals:   08/10/22 1942 08/11/22 0419  BP: (!) 154/79 125/82  Pulse: 81 80  Resp: 18 18  Temp: 98.4 F (36.9 C) 98.2 F (36.8 C)  SpO2: 97% 95%    History of present illness:  [name] is a 57 year old female with history of previous abdominal surgery, previous SBO, obesity, coronary artery disease, GERD, heart failure preserved ejection fraction, uncontrolled type II diabetes mellitus, insulin-dependent type II diabetes mellitus, who presents emergency department for chief concerns of hyperglycemia.  Workup revealed ileus.  Was later  able to have bowel movements and was tolerating a diet with small meals throughout the day rather than 3 big meals.  Had some loose stools in the last 24 hours and laxatives were held off.  Afebrile with no leukocytosis.  08/11/2022: The patient was seen and examined at bedside.  There were no acute events overnight.  There were no new complaints.  Hospital Course:  Principal Problem:   UTI (urinary tract infection) Active Problems:   Obesity, Class III, BMI 40-49.9 (morbid obesity) (HCC)   AKI (acute kidney injury) (HCC)   Essential hypertension, benign   GERD without esophagitis   Chronic diastolic CHF (congestive heart failure) (HCC)   Dyslipidemia associated with type 2 diabetes mellitus (HCC)   CAD (coronary artery disease)   Lightheadedness   Hemiparesis affecting left side as late effect of cerebrovascular accident (CVA) (HCC)   Uncontrolled type 2 diabetes mellitus with hyperglycemia (HCC)   Insulin dependent type 2 diabetes mellitus (HCC)   Lower urinary tract infectious disease   Abdominal distension  UTI (urinary tract infection), ruled out This has to be interpreted in the context of patient's urine culture from July 19, 2022 which grew E. coli with intermediate sensitivity to ciprofloxacin.  At the time of patient's discharge on July 22, 2022.  Patient was advised to stop taking ciprofloxacin and be treated with Keflex.  Unfortunately patient took ciprofloxacin for approximately 7 days and subsequent to that only started taking Keflex.  Unfortunately patient has failed this treatment and now has ongoing symptoms of dysuria.  With associated leukocytes in the urine.  As well  as mild lactic acidosis.  Additional factors for failure in this patient would include her uncontrolled diabetes mellitus with hyperglycemia.  Regardless for thoroughness I will do an ultrasound study of the kidney ureter bladder.  Lactic acidosis has resolved with IV fluids.  Not felt to represent sepsis at this  time and may be due to hyperglycemia/DM itself.[name] Please folow up urine culture sent from ER. Rx with ceftraxone. S.p PO bactrim in ER X 1 Ceftriaxone 1 g IV daily, DC'd on 08/10/2022.   Resolved: Concern for ileus seen on CT scan done on 08/08/2022 Possible gastroparesis Had a couple of loose bowel movements, laxatives were held. Small meals throughout the day and avoiding dehydration. Mobilize as tolerated   Resolved AKI (acute kidney injury) (HCC) Improving, repeat creatinine was 1.15/eGFR 56 Patient's baseline sCr is 0.85-0.99/eGFR > 60 Continue sodium chloride infusion at 100 mL/h, for 1 more day Recheck BMP in the a.m.   Essential hypertension, benign Home amlodipine 10 mg daily, carvedilol 25 mg p.o. twice daily, hydralazine 50 mg every 8 hours, furosemide 20 mg daily   Dyslipidemia associated with type 2 diabetes mellitus (HCC) Rosuvastatin 10 mg daily   CAD (coronary artery disease) Plavix, rosuvastatin, antihypertensive medication control, continue diabetes mellitus management   Uncontrolled type 2 diabetes mellitus with hyperglycemia (HCC) Reduce insulin doses to avoid hypoglycemia Hemoglobin today 8A.  Today I will A1c.  9.7 on 07/20/22   Hemiparesis affecting left side as late effect of cerebrovascular accident (CVA) (HCC) Home Plavix 75 mg daily, rosuvastatin 10 mg daily, antihypertensive medication, optimize diabetes mellitus management   Physical debility PT OT assessment Fall precautions.    Code Status: Full code  Consultants:  None at this time   Procedures:  None at this time   Antimicrobials: Ceftriaxone     Discharge Exam: BP 125/82 (BP Location: Right Arm)   Pulse 80   Temp 98.2 F (36.8 C) (Oral)   Resp 18   Ht 5\' 6"  (1.676 m)   Wt 112.2 kg   LMP  (LMP Unknown)   SpO2 95%   BMI 39.92 kg/m  General: 57 y.o. year-old female well developed well nourished in no acute distress.  Alert and oriented x3. Cardiovascular: Regular rate and rhythm  with no rubs or gallops.  No thyromegaly or JVD noted.   Respiratory: Clear to auscultation with no wheezes or rales. Good inspiratory effort. Abdomen: Soft nontender nondistended with normal bowel sounds x4 quadrants. Musculoskeletal: No lower extremity edema. 2/4 pulses in all 4 extremities. Skin: No ulcerative lesions noted or rashes, Psychiatry: Mood is appropriate for condition and setting  Discharge Instructions You were cared for by a hospitalist during your hospital stay. If you have any questions about your discharge medications or the care you received while you were in the hospital after you are discharged, you can call the unit and asked to speak with the hospitalist on call if the hospitalist that took care of you is not available. Once you are discharged, your primary care physician will handle any further medical issues. Please note that NO REFILLS for any discharge medications will be authorized once you are discharged, as it is imperative that you return to your primary care physician (or establish a relationship with a primary care physician if you do not have one) for your aftercare needs so that they can reassess your need for medications and monitor your lab values.   Allergies as of 08/11/2022       Reactions  Codeine Swelling, Other (See Comments)   Swelling and burning of mouth (inside)   [place] [dapagliflozin] Other (See Comments)   Recurrent UTI with farxiga   Lactose Intolerance (gi) Nausea And Vomiting        Medication List     STOP taking these medications    cloNIDine 0.1 MG tablet Commonly known as: CATAPRES   mirabegron ER 50 MG Tb24 tablet Commonly known as: Myrbetriq   olmesartan 40 MG tablet Commonly known as: BENICAR   Trulicity 4.5 MG/0.5ML Sopn Generic drug: Dulaglutide       TAKE these medications    albuterol 1.25 MG/3ML nebulizer solution Commonly known as: ACCUNEB Take 1 ampule by nebulization 2 (two) times daily.    albuterol 108 (90 Base) MCG/ACT inhaler Commonly known as: VENTOLIN HFA Inhale 1-2 puffs into the lungs every 6 (six) hours as needed.   amitriptyline 25 MG tablet Commonly known as: ELAVIL Take 2-3 tablets (50-75 mg total) by mouth at bedtime.   amLODipine 10 MG tablet Commonly known as: NORVASC Take 10 mg by mouth daily.   blood glucose meter kit and supplies Kit Dispense based on patient and insurance preference. Use up to four times daily as directed. (FOR E11.65 Z79.4).   BLOOD GLUCOSE TEST STRIPS Strp Use as directed to monitor blood sugar up to 4x a day FOR E11.65 Z79.4   butalbital-acetaminophen-caffeine 50-325-40 MG tablet Commonly known as: FIORICET Take 1-2 tablets by mouth every 6 (six) hours as needed for headache.   carvedilol 25 MG tablet Commonly known as: COREG Take 25 mg by mouth 2 (two) times daily.   cholecalciferol 25 MCG (1000 UNIT) tablet Commonly known as: VITAMIN D3 Take 1,000 Units by mouth daily.   clopidogrel 75 MG tablet Commonly known as: PLAVIX Take 1 tablet (75 mg total) by mouth daily.   cycloSPORINE 0.05 % ophthalmic emulsion Commonly known as: RESTASIS Place 2 drops into both eyes 2 (two) times daily.   DULoxetine 60 MG capsule Commonly known as: Cymbalta Take 1 capsule (60 mg total) by mouth daily.   EPINEPHrine 0.3 mg/0.3 mL Soaj injection Commonly known as: EPI-PEN Inject 0.3 mg into the muscle as needed for anaphylaxis.   ezetimibe 10 MG tablet Commonly known as: ZETIA Take 1 tablet (10 mg total) by mouth daily.   furosemide 20 MG tablet Commonly known as: LASIX Take 20 mg by mouth daily.   gabapentin 600 MG tablet Commonly known as: Neurontin 600 mg qAM, 600 mg qPM, 1200 mg QHS   Gvoke HypoPen 2-Pack 1 MG/0.2ML Soaj Generic drug: Glucagon INJECT 1 MG INTO THE SKIN ONCE AS NEEDED FOR UP TO 1 DOSE (HYPOGLYCEMIA). ONLY FOR SEVERE LOW BLOOD SUGAR NOT RESPONDING TO DRINKING JUICE/GLUCOSE TABLETS, WHEN CBG IS <70    hydrALAZINE 50 MG tablet Commonly known as: APRESOLINE Take 1 tablet by mouth 3 (three) times daily.   insulin lispro 100 UNIT/ML KwikPen Commonly known as: HumaLOG KwikPen Inject up to 7 units before each meal What changed: additional instructions   Lancets Misc Use as directed to monitor blood sugar up to 4x a day FOR E11.65 Z79.4   levETIRAcetam 500 MG tablet Commonly known as: KEPPRA Take 500-750 mg by mouth See admin instructions. Take 1 tablet (500mg ) by mouth every morning and take 1 tablets by mouth (750mg ) by mouth every night   meclizine 25 MG tablet Commonly known as: ANTIVERT Take 1 tablet (25 mg total) by mouth 3 (three) times daily as needed for dizziness.  pantoprazole 40 MG tablet Commonly known as: PROTONIX Take 1 tablet (40 mg total) by mouth daily.   polyethylene glycol 17 g packet Commonly known as: MIRALAX / GLYCOLAX Take 17 g by mouth daily as needed for mild constipation.   rosuvastatin 10 MG tablet Commonly known as: CRESTOR Take 1 tablet (10 mg total) by mouth daily.   senna-docusate 8.6-50 MG tablet Commonly known as: Senokot-S Take 1 tablet by mouth at bedtime as needed for mild constipation.   Toujeo Max SoloStar 300 UNIT/ML Solostar Pen Generic drug: insulin glargine (2 Unit Dial) 20 units in am What changed: additional instructions   Wixela Inhub 250-50 MCG/DOSE Aepb Generic drug: fluticasone-salmeterol Inhale 1 puff into the lungs 2 (two) times daily.               Durable Medical Equipment  (From admission, onward)           Start     Ordered   08/04/22 1626  For home use only DME Walker rolling  Once       Question Answer Comment  Walker: With 5 Inch Wheels   Patient needs a walker to treat with the following condition UTI (urinary tract infection)      08/04/22 1626           Allergies  Allergen Reactions   Codeine Swelling and Other (See Comments)    Swelling and burning of mouth (inside)   [place]  [Dapagliflozin] Other (See Comments)    Recurrent UTI with farxiga   Lactose Intolerance (Gi) Nausea And Vomiting    Follow-up Information     [name], [name] Follow up on 08/14/2022.   Specialty: Family Medicine Why: Go at  2:00pm. Contact information: [address] [place] [address] [phone]         [facility] Follow up.   Why: They will follow up with you for your home health therapy needs.                 The results of significant diagnostics from this hospitalization (including imaging, microbiology, ancillary and laboratory) are listed below for reference.    Significant Diagnostic Studies: CT ABDOMEN PELVIS W CONTRAST  Result Date: 08/08/2022 CLINICAL DATA:  Concern for bowel obstruction. EXAM: CT ABDOMEN AND PELVIS WITH CONTRAST TECHNIQUE: Multidetector CT imaging of the abdomen and pelvis was performed using the standard protocol following bolus administration of intravenous contrast. RADIATION DOSE REDUCTION: This exam was performed according to the departmental dose-optimization program which includes automated exposure control, adjustment of the mA and/or kV according to patient size and/or use of iterative reconstruction technique. CONTRAST:  OMNIPAQUE IOHEXOL 350 MG/ML SOLN COMPARISON:  CT abdomen pelvis dated 10/15/2021. FINDINGS: Lower chest: Trace bilateral pleural effusions with minimal bibasilar subpleural atelectasis. No intra-abdominal free air or free fluid. Hepatobiliary: The liver is unremarkable. No biliary dilatation. The gallbladder is unremarkable. Pancreas: Unremarkable. No pancreatic ductal dilatation or surrounding inflammatory changes. Spleen: Normal in size without focal abnormality. Adrenals/Urinary Tract: The left adrenal gland is unremarkable. Stable 2.5 cm right adrenal adenoma. Small nonobstructing bilateral renal calculi measure up to 6 mm in the inferior pole of the right kidney. There is no  hydronephrosis on either side. There is symmetric enhancement and excretion of contrast by both kidneys. The visualized ureters and urinary bladder appear unremarkable. Stomach/Bowel: There is sigmoid diverticulosis without active inflammatory changes. Multiple mildly dilated loops of proximal small bowel measure up to 4.5 cm no discrete transition  noted. Findings favored to represent an ileus. Developing obstruction is not excluded. Small-bowel series may provide better evaluation. The appendix is normal. Vascular/Lymphatic: The abdominal aorta and IVC are unremarkable. No portal venous gas. There is no adenopathy. Reproductive: Hysterectomy.  No adnexal masses. Other: Midline vertical anterior pelvic wall incisional scar. Small fat containing umbilical hernia. Musculoskeletal: Degenerative changes of the spine. No acute osseous pathology. IMPRESSION: 1. Mildly dilated proximal small bowel favored to represent an ileus. Developing obstruction is not excluded. Small-bowel series may provide better evaluation. 2. Sigmoid diverticulosis. 3. Nonobstructing bilateral renal calculi. No hydronephrosis. Electronically Signed   By: Elgie Collard M.D.   On: 08/08/2022 21:55   DG Abd 1 View  Result Date: 08/08/2022 CLINICAL DATA:  Abdominal pain and constipation, initial encounter EXAM: ABDOMEN - 1 VIEW COMPARISON:  10/18/2021 FINDINGS: Scattered large and small bowel gas is noted. A few mildly dilated loops of small bowel are noted in the left mid abdomen. This may represent a partial small bowel obstruction or ileus. CT may be helpful for further evaluation. No free air is noted. No bony abnormality is seen. IMPRESSION: Mildly dilated loops of small bowel on the left suspicious for ileus or early small bowel obstruction. CT may be helpful as clinically indicated. Electronically Signed   By: Alcide Clever M.D.   On: 08/08/2022 20:24   US RENAL  Result Date: 08/03/2022 CLINICAL DATA:  [id number] UTI (urinary tract  infection) [id number] EXAM: RENAL / URINARY TRACT ULTRASOUND COMPLETE COMPARISON:  CT renal 10/15/2021 FINDINGS: Right Kidney: Renal measurements: 10.5 x 5 x 4.9 cm = volume: 135 mL. Echogenicity within normal limits. No mass or hydronephrosis visualized. Left Kidney: Renal measurements: 12 x 7.8 x 5.2 cm = volume: 251 mL. Echogenicity within normal limits. No mass or hydronephrosis visualized. Urinary bladder: Appears normal for degree of bladder distention. Other: None. IMPRESSION: Unremarkable renal ultrasound. Electronically Signed   By: Tish Frederickson M.D.   On: 08/03/2022 20:47   DG Chest Port 1 View  Result Date: 07/19/2022 CLINICAL DATA:  Weakness EXAM: PORTABLE CHEST 1 VIEW COMPARISON:  Chest x-ray July 07, 2021 FINDINGS: The cardiomediastinal silhouette is unchanged in contour. Of note, overlying hair partially obscures visualization of biapical lungs. No focal pulmonary opacity. No pleural effusion or pneumothorax. The visualized upper abdomen is unremarkable. No acute osseous abnormality. IMPRESSION: No acute cardiopulmonary abnormality. Electronically Signed   By: Jacob Moores M.D.   On: 07/19/2022 15:12    Microbiology: Recent Results (from the past 240 hour(s))  Urine Culture (for pregnant, neutropenic or urologic patients or patients with an indwelling urinary catheter)     Status: Abnormal   Collection Time: 08/03/22  2:09 PM   Specimen: Urine, Random  Result Value Ref Range Status   Specimen Description   Final    URINE, RANDOM Performed at [facility], [address] [place], [place] [address]    Special Requests   Final    NONE Performed at [facility], [address] [place], [place] [address]    Culture MULTIPLE SPECIES PRESENT, SUGGEST RECOLLECTION (A)  Final   Report Status 08/05/2022 FINAL  Final  Urine Culture     Status: Abnormal   Collection Time: 08/06/22 11:33 AM   Specimen: Urine, Random  Result Value Ref Range Status   Specimen  Description   Final    URINE, RANDOM Performed at [facility], [address] [place], [place] [address]    Special Requests   Final  NONE Reflexed from [id number] Performed at [facility], [address] [place], [place] [address]    Culture 80,000 COLONIES/mL YEAST (A)  Final   Report Status 08/08/2022 FINAL  Final     Labs: Basic Metabolic Panel: Recent Labs  Lab 08/06/22 0522 08/07/22 0627 08/08/22 0857 08/09/22 0534 08/10/22 0459  NA 131* 134* 136 134*  --   K 4.5 4.4 4.4 4.6  --   CL 100 99 105 107  --   CO2 22 25 25  21*  --   GLUCOSE 167* 267* 174* 114*  --   BUN 42* 45* 35* 24*  --   CREATININE 1.86* 1.48* 1.15* 0.86 0.88  CALCIUM 8.9 9.1 9.0 8.6*  --    Liver Function Tests: No results for input(s): "AST", "ALT", "ALKPHOS", "BILITOT", "PROT", "ALBUMIN" in the last 168 hours. No results for input(s): "LIPASE", "AMYLASE" in the last 168 hours. No results for input(s): "AMMONIA" in the last 168 hours. CBC: Recent Labs  Lab 08/08/22 0857 08/09/22 0534  WBC 7.2 7.5  HGB 12.4 11.8*  HCT 38.7 37.3  MCV 90.8 92.8  PLT 261 214   Cardiac Enzymes: No results for input(s): "CKTOTAL", "CKMB", "CKMBINDEX", "TROPONINI" in the last 168 hours. BNP: BNP (last 3 results) No results for input(s): "BNP" in the last 8760 hours.  ProBNP (last 3 results) No results for input(s): "PROBNP" in the last 8760 hours.  CBG: Recent Labs  Lab 08/10/22 1155 08/10/22 1723 08/10/22 2137 08/11/22 0824 08/11/22 1155  GLUCAP 195* 170* 217* 201* 204*       Signed:  Darlin Drop, MD [facility] 08/11/2022, 4:15 PM

## 2022-08-11 NOTE — Plan of Care (Signed)
  Problem: Education: Goal: Ability to describe self-care measures that may prevent or decrease complications (Diabetes Survival Skills Education) will improve Outcome: Progressing   Problem: Coping: Goal: Ability to adjust to condition or change in health will improve Outcome: Progressing   Problem: Health Behavior/Discharge Planning: Goal: Ability to manage health-related needs will improve Outcome: Progressing   Problem: Nutritional: Goal: Maintenance of adequate nutrition will improve Outcome: Progressing   Problem: Skin Integrity: Goal: Risk for impaired skin integrity will decrease Outcome: Progressing   Problem: Tissue Perfusion: Goal: Adequacy of tissue perfusion will improve Outcome: Progressing   Problem: Clinical Measurements: Goal: Ability to maintain clinical measurements within normal limits will improve Outcome: Progressing Goal: Will remain free from infection Outcome: Progressing Goal: Respiratory complications will improve Outcome: Progressing Goal: Cardiovascular complication will be avoided Outcome: Progressing   Problem: Activity: Goal: Risk for activity intolerance will decrease Outcome: Progressing   Problem: Elimination: Goal: Will not experience complications related to bowel motility Outcome: Progressing Goal: Will not experience complications related to urinary retention Outcome: Progressing   Problem: Pain Managment: Goal: General experience of comfort will improve Outcome: Progressing

## 2022-08-12 ENCOUNTER — Telehealth: Payer: Self-pay | Admitting: *Deleted

## 2022-08-12 NOTE — Transitions of Care (Post Inpatient/ED Visit) (Signed)
   08/12/2022  Name: [name] MRN: [id number] DOB: [birth year]  Today's TOC FU Call Status: Today's TOC FU Call Status:: Unsuccessul Call (1st Attempt) Unsuccessful Call (1st Attempt) Date: 08/12/22  Attempted to reach the patient regarding the most recent Inpatient/ED visit.  Follow Up Plan: Additional outreach attempts will be made to reach the patient to complete the Transitions of Care (Post Inpatient/ED visit) call.   Gean Maidens BSN RN [facility] [phone]

## 2022-08-13 ENCOUNTER — Telehealth: Payer: Self-pay | Admitting: *Deleted

## 2022-08-13 ENCOUNTER — Telehealth: Payer: Self-pay | Admitting: Family Medicine

## 2022-08-13 NOTE — Telephone Encounter (Signed)
Called pt stated we can't send discharge summary without a release since it was not from our office and she would have to have a specific fax # to where she would need it faxed to.

## 2022-08-13 NOTE — Transitions of Care (Post Inpatient/ED Visit) (Signed)
08/13/2022  Name: [name] MRN: [id number] DOB: [birth year]  Today's TOC FU Call Status: Today's TOC FU Call Status:: Successful TOC FU Call Competed TOC FU Call Complete Date: 08/13/22  Transition Care Management Follow-up Telephone Call Date of Discharge: 08/11/22 Discharge Facility: [facility]) Type of Discharge: Inpatient Admission Primary Inpatient Discharge Diagnosis:: hyperglycemia How have you been since you were released from the hospital?: Better Any questions or concerns?: Yes Patient Questions/Concerns:: patient wanted to know why the hospitalist stopped Trulicity Patient Questions/Concerns Addressed: Other:, Notified Provider of Patient Questions/Concerns (Patient will discuss with PCP at visit [id number])  Items Reviewed: Did you receive and understand the discharge instructions provided?: Yes Medications obtained,verified, and reconciled?: Yes (Medications Reviewed) Any new allergies since your discharge?: No Dietary orders reviewed?: No Do you have support at home?: Yes People in Home: child(ren), adult Name of Support/Comfort Primary Source: [place]  Medications Reviewed Today: Medications Reviewed Today     Reviewed by Luella Cook, RN (Case Manager) on 08/13/22 at 1158  Med List Status: <None>   Medication Order Taking? Sig Documenting Provider Last Dose Status Informant  albuterol (ACCUNEB) 1.25 MG/3ML nebulizer solution [id number] Yes Take 1 ampule by nebulization 2 (two) times daily. [provider] Taking Active   albuterol (VENTOLIN HFA) 108 (90 Base) MCG/ACT inhaler [id number] Yes Inhale 1-2 puffs into the lungs every 6 (six) hours as needed. [provider] Taking Active Multiple Informants, Self, Pharmacy Records  amitriptyline (ELAVIL) 25 MG tablet [id number] Yes Take 2-3 tablets (50-75 mg total) by mouth at bedtime. Edward Jolly, MD Taking Active Multiple Informants, Self, Pharmacy Records   amLODipine (NORVASC) 10 MG tablet [id number] Yes Take 10 mg by mouth daily. [provider] Taking Active Multiple Informants, Self, Pharmacy Records  blood glucose meter kit and supplies KIT [id number] Yes Dispense based on patient and insurance preference. Use up to four times daily as directed. (FOR E11.65 Z79.4). [name], [name] Taking Active Multiple Informants, Self, Pharmacy Records  butalbital-acetaminophen-caffeine [facility]) [phone] MG tablet [id number] Yes Take 1-2 tablets by mouth every 6 (six) hours as needed for headache. Edward Jolly, MD Taking Active Multiple Informants, Self, Pharmacy Records  carvedilol (COREG) 25 MG tablet [id number] Yes Take 25 mg by mouth 2 (two) times daily. [provider] Taking Active Multiple Informants, Self, Pharmacy Records  cholecalciferol (VITAMIN D3) 25 MCG (1000 UNIT) tablet [id number] Yes Take 1,000 Units by mouth daily. [provider] Taking Active Multiple Informants, Self, Pharmacy Records  clopidogrel (PLAVIX) 75 MG tablet [id number] Yes Take 1 tablet (75 mg total) by mouth daily. Jamelle Haring, MD Taking Active Multiple Informants, Self, Pharmacy Records  cycloSPORINE (RESTASIS) 0.05 % ophthalmic emulsion [id number] Yes Place 2 drops into both eyes 2 (two) times daily. [provider] Taking Active Multiple Informants, Self, Pharmacy Records           Med Note Dollene Primrose   Wed Nov 13, 2020 10:05 AM)    DULoxetine (CYMBALTA) 60 MG capsule [id number] Yes Take 1 capsule (60 mg total) by mouth daily. Berniece Salines, [name] Taking Active Multiple Informants, Self, Pharmacy Records  EPINEPHrine 0.3 mg/0.3 mL IJ SOAJ injection [id number] Yes Inject 0.3 mg into the muscle as needed for anaphylaxis. [provider] Taking Active Multiple Informants, Self, Pharmacy Records  ezetimibe (ZETIA) 10 MG tablet [id number] Yes Take 1 tablet (10 mg total) by mouth daily. Berniece Salines, [name] Taking Active  Multiple Informants, Self, Pharmacy Records  furosemide (LASIX) 20  MG tablet [id number] Yes Take 20 mg by mouth daily. [provider] Taking Active Multiple Informants, Self, Pharmacy Records  gabapentin (NEURONTIN) 600 MG tablet [id number] Yes 600 mg qAM, 600 mg qPM, 1200 mg QHS Edward Jolly, MD Taking Active Multiple Informants, Self, Pharmacy Records  Glucagon (GVOKE HYPOPEN 2-PACK) 1 MG/0.2ML SOAJ [id number] Yes INJECT 1 MG INTO THE SKIN ONCE AS NEEDED FOR UP TO 1 DOSE (HYPOGLYCEMIA). ONLY FOR SEVERE LOW BLOOD SUGAR NOT RESPONDING TO DRINKING JUICE/GLUCOSE TABLETS, WHEN CBG IS <70 [name], [name] Taking Active Multiple Informants, Self, Pharmacy Records  Glucose Blood (BLOOD GLUCOSE TEST STRIPS) STRP [id number] Yes Use as directed to monitor blood sugar up to 4x a day FOR E11.65 Z79.4 [name], [name] Taking Active Multiple Informants, Self, Pharmacy Records  hydrALAZINE (APRESOLINE) 50 MG tablet [id number] Yes Take 1 tablet by mouth 3 (three) times daily. [provider] Taking Active Multiple Informants, Self, Pharmacy Records  insulin glargine, 2 Unit Dial, (TOUJEO MAX SOLOSTAR) 300 UNIT/ML Solostar Pen [id number] Yes 20 units in am Darlin Drop, DO Taking Active   insulin lispro (HUMALOG KWIKPEN) 100 UNIT/ML KwikPen [id number] Yes Inject up to 7 units before each meal Darlin Drop, DO Taking Active   Lancets MISC [id number] Yes Use as directed to monitor blood sugar up to 4x a day FOR E11.65 Z79.4 [name], [name] Taking Active Multiple Informants, Self, Pharmacy Records  levETIRAcetam (KEPPRA) 500 MG tablet [id number] Yes Take 500-750 mg by mouth See admin instructions. Take 1 tablet (500mg ) by mouth every morning and take 1 tablets by mouth (750mg ) by mouth every night [provider] Taking Active Multiple Informants, Self, Pharmacy Records  meclizine (ANTIVERT) 25 MG tablet [id number] Yes Take 1 tablet (25 mg total) by mouth 3 (three) times daily as needed for  dizziness. Trinna Post, MD Taking Active Multiple Informants, Self, Pharmacy Records  pantoprazole (PROTONIX) 40 MG tablet [id number] Yes Take 1 tablet (40 mg total) by mouth daily. Enedina Finner, MD Taking Active Multiple Informants, Self, Pharmacy Records  polyethylene glycol (MIRALAX / GLYCOLAX) 17 g packet [id number] Yes Take 17 g by mouth daily as needed for mild constipation. Meredeth Ide, MD Taking Active Multiple Informants, Self, Pharmacy Records  rosuvastatin (CRESTOR) 10 MG tablet [id number] Yes Take 1 tablet (10 mg total) by mouth daily. Berniece Salines, [name] Taking Active Multiple Informants, Self, Pharmacy Records  senna-docusate (SENOKOT-S) 8.6-50 MG tablet [id number] Yes Take 1 tablet by mouth at bedtime as needed for mild constipation. Meredeth Ide, MD Taking Active Multiple Informants, Self, Pharmacy Records  [facility] INHUB 250-50 MCG/DOSE AEPB [id number] Yes Inhale 1 puff into the lungs 2 (two) times daily. [provider] Taking Active Multiple Informants, Self, Pharmacy Records  Med List Note Melford Aase 03/11/21 6967): 12-06-18 UDS Oxy/Acet approved through 01-19-20 Mr 03-10-2021            Home Care and Equipment/Supplies: Were Home Health Services Ordered?: Yes Name of Home Health Agency:: [facility] Has Agency set up a time to come to your home?: Yes First Home Health Visit Date: 08/13/22 Any new equipment or medical supplies ordered?: Yes Name of Medical supply agency?: [facility] Were you able to get the equipment/medical supplies?: Yes Do you have any questions related to the use of the equipment/supplies?: No  Functional Questionnaire: Do you need assistance with bathing/showering or dressing?: No Do you need assistance with meal preparation?: Yes Do you need assistance with eating?: No Do you have difficulty maintaining continence:  No Do you need assistance with getting out of bed/getting out of a chair/moving?: No Do you have difficulty managing or taking  your medications?: No  Follow up appointments reviewed: PCP Follow-up appointment confirmed?: Yes Date of PCP follow-up appointment?: 08/19/22 Follow-up Provider: [name] PA Specialist Hospital Follow-up appointment confirmed?: NA Do you need transportation to your follow-up appointment?: Yes Transportation Need Intervention Addressed By:: Transportation Arranged (Patient rescheduled appt so she could arrange [facility] transportation) Do you understand care options if your condition(s) worsen?: Yes-patient verbalized understanding  SDOH Interventions Today    Flowsheet Row Most Recent Value  SDOH Interventions   Food Insecurity Interventions Intervention Not Indicated  [RN explained how to call for the [facility] meal plan since recently discharged]  Transportation Interventions Intervention Not Indicated  [patient contacted [facility] transportation]      Interventions Today    Flowsheet Row Most Recent Value  General Interventions   General Interventions Discussed/Reviewed General Interventions Discussed, General Interventions Reviewed, Doctor Visits, Referral to Nurse, Communication with  [Referred to [facility] regarding  mangement of Diabetes . Appt for 08/18/2022 2:30]  Doctor Visits Discussed/Reviewed Doctor Visits Discussed, Doctor Visits Reviewed, PCP  [communicated with PCP regarding discontinued Trulicity.]  Pharmacy Interventions   Pharmacy Dicussed/Reviewed Pharmacy Topics Discussed, Pharmacy Topics Reviewed      TOC Interventions Today    Flowsheet Row Most Recent Value  TOC Interventions   TOC Interventions Discussed/Reviewed TOC Interventions Discussed, TOC Interventions Reviewed      Gean Maidens BSN RN [facility] Care Management [phone]

## 2022-08-13 NOTE — Telephone Encounter (Signed)
Copied from CRM [id number]. Topic: General - Other >> Aug 13, 2022  1:48 PM Santiya F wrote: Reason for CRM: Pt is calling in because per pt [employer] has a meal plan program that pt qualifies for for 14 days of meals. Pt says she needs the office to send a discharge summary from 08/11/22 over to [facility] so she can get the meals.

## 2022-08-14 ENCOUNTER — Inpatient Hospital Stay: Payer: 59 | Admitting: Family Medicine

## 2022-08-14 ENCOUNTER — Telehealth: Payer: Self-pay | Admitting: Family Medicine

## 2022-08-14 DIAGNOSIS — E1165 Type 2 diabetes mellitus with hyperglycemia: Secondary | ICD-10-CM | POA: Diagnosis not present

## 2022-08-14 DIAGNOSIS — N390 Urinary tract infection, site not specified: Secondary | ICD-10-CM | POA: Diagnosis not present

## 2022-08-14 DIAGNOSIS — J449 Chronic obstructive pulmonary disease, unspecified: Secondary | ICD-10-CM | POA: Diagnosis not present

## 2022-08-14 DIAGNOSIS — E1140 Type 2 diabetes mellitus with diabetic neuropathy, unspecified: Secondary | ICD-10-CM | POA: Diagnosis not present

## 2022-08-14 DIAGNOSIS — I519 Heart disease, unspecified: Secondary | ICD-10-CM | POA: Diagnosis not present

## 2022-08-14 DIAGNOSIS — I69354 Hemiplegia and hemiparesis following cerebral infarction affecting left non-dominant side: Secondary | ICD-10-CM | POA: Diagnosis not present

## 2022-08-14 DIAGNOSIS — H5442A5 Blindness left eye category 5, normal vision right eye: Secondary | ICD-10-CM | POA: Diagnosis not present

## 2022-08-14 DIAGNOSIS — I119 Hypertensive heart disease without heart failure: Secondary | ICD-10-CM | POA: Diagnosis not present

## 2022-08-14 DIAGNOSIS — Z794 Long term (current) use of insulin: Secondary | ICD-10-CM | POA: Diagnosis not present

## 2022-08-14 NOTE — Telephone Encounter (Unsigned)
Copied from CRM [id number]. Topic: Quick Communication - Home Health Verbal Orders >> Aug 14, 2022  1:49 PM Everette C wrote: Caller/Agency: Cristela Felt  Callback Number: [phone] Requesting OT/PT/Skilled Nursing/Social Work/Speech Therapy: PT  Frequency: 2w4 1w4

## 2022-08-17 ENCOUNTER — Telehealth: Payer: Self-pay | Admitting: Family Medicine

## 2022-08-17 DIAGNOSIS — Z794 Long term (current) use of insulin: Secondary | ICD-10-CM | POA: Diagnosis not present

## 2022-08-17 DIAGNOSIS — E1165 Type 2 diabetes mellitus with hyperglycemia: Secondary | ICD-10-CM | POA: Diagnosis not present

## 2022-08-17 DIAGNOSIS — H5442A5 Blindness left eye category 5, normal vision right eye: Secondary | ICD-10-CM | POA: Diagnosis not present

## 2022-08-17 DIAGNOSIS — I69354 Hemiplegia and hemiparesis following cerebral infarction affecting left non-dominant side: Secondary | ICD-10-CM | POA: Diagnosis not present

## 2022-08-17 DIAGNOSIS — I519 Heart disease, unspecified: Secondary | ICD-10-CM | POA: Diagnosis not present

## 2022-08-17 DIAGNOSIS — N390 Urinary tract infection, site not specified: Secondary | ICD-10-CM | POA: Diagnosis not present

## 2022-08-17 DIAGNOSIS — J449 Chronic obstructive pulmonary disease, unspecified: Secondary | ICD-10-CM | POA: Diagnosis not present

## 2022-08-17 DIAGNOSIS — E1140 Type 2 diabetes mellitus with diabetic neuropathy, unspecified: Secondary | ICD-10-CM | POA: Diagnosis not present

## 2022-08-17 DIAGNOSIS — I119 Hypertensive heart disease without heart failure: Secondary | ICD-10-CM | POA: Diagnosis not present

## 2022-08-17 NOTE — Telephone Encounter (Unsigned)
Copied from CRM [id number]. Topic: Quick Communication - Home Health Verbal Orders >> Aug 17, 2022 12:50 PM Everette C wrote: Caller/Agency: Vonzell Schlatter / [facility]  Callback Number: [phone] Requesting OT/PT/Skilled Nursing/Social Work/Speech Therapy: OT Frequency: 2w6

## 2022-08-17 NOTE — Telephone Encounter (Signed)
Home Health Verbal Orders - Caller/Agency: Morrie Sheldon from [place] Callback Number: [phone] Requesting OT Frequency: Evaluation

## 2022-08-18 ENCOUNTER — Ambulatory Visit: Payer: Self-pay | Admitting: *Deleted

## 2022-08-18 NOTE — Patient Outreach (Signed)
  Care Coordination   Follow Up Visit Note   08/19/2022 Name: [name] MRN: [id number] DOB: 10/26/1965  [name] is a 57 y.o. year old female who sees [name], [place] for primary care. I spoke with  [name] by phone today.  What matters to the patients health and wellness today?  Keep blood sugars and blood pressure down    Goals Addressed             This Visit's Progress    Management of chronic medical conditions (DM, HTN, CHF)       Interventions Today    Flowsheet Row Most Recent Value  Chronic Disease   Chronic disease during today's visit Diabetes, Congestive Heart Failure (CHF), Hypertension (HTN)  General Interventions   General Interventions Discussed/Reviewed General Interventions Reviewed, Doctor Visits, [facility], [place], Annual Eye Exam  [Uses [facility] transportation for MD appointments.  Active with [facility] for PT/OT]  Labs Hgb A1c every 3 months  [A1C 9.7, goal less than 7]  Doctor Visits Discussed/Reviewed Doctor Visits Reviewed, PCP, Specialist  [PCP 8/6, PFT 8/15, Endocrine 8/19, Pulmonary 8/27]  PCP/Specialist Visits Compliance with follow-up visit  Exercise Interventions   Exercise Discussed/Reviewed Weight Managment  Weight Management Weight maintenance  [Report weight has been stable, today was 230 pounds]  Education Interventions   Education Provided Provided Education  Provided Verbal Education On Blood Sugar Monitoring, Eye Care, Nutrition, Medication, When to see the doctor, Other  [Clonidine, Olmesartan, and trulicity were all stopped at discharge, state she has continued to take, fearing BP and blood sugars would be too high.  State they have been stable.  BP 120/80 and blood sugar range 140-180]              SDOH assessments and interventions completed:  Yes  SDOH Interventions Today    Flowsheet Row Most Recent Value  SDOH Interventions   Housing Interventions Intervention Not Indicated   Utilities Interventions Intervention Not Indicated        Care Coordination Interventions:  Yes, provided   Follow up plan: Follow up call scheduled for 8/20    Encounter Outcome:  Pt. Visit Completed   Kemper Durie, RN, MSN, [facility] Care Management Care Management Coordinator [phone]

## 2022-08-19 ENCOUNTER — Ambulatory Visit: Payer: 59 | Admitting: Family Medicine

## 2022-08-19 NOTE — Patient Instructions (Signed)
Visit Information  Thank you for taking time to visit with me today. Please don't hesitate to contact me if I can be of assistance to you before our next scheduled telephone appointment.  Following are the goals we discussed today:  Notify MD that you have not stopped medications. Continue monitoring BP and blood sugars daily.    Our next appointment is by telephone on 8/20  Please call the care guide team at [phone] if you need to cancel or reschedule your appointment.   Please call the Suicide and Crisis Lifeline: 988 call the [place] National Suicide Prevention Lifeline: 959-685-1425 or TTY: (810) 577-0287 TTY 906-490-2470) to talk to a trained counselor call 1-800-273-TALK (toll free, 24 hour hotline) call 911 if you are experiencing a Mental Health or Behavioral Health Crisis or need someone to talk to.  Patient verbalizes understanding of instructions and care plan provided today and agrees to view in MyChart. Active MyChart status and patient understanding of how to access instructions and care plan via MyChart confirmed with patient.     The patient has been provided with contact information for the care management team and has been advised to call with any health related questions or concerns.   Kemper Durie [facility] Care Management Care Management Coordinator [phone]

## 2022-08-20 DIAGNOSIS — H5442A5 Blindness left eye category 5, normal vision right eye: Secondary | ICD-10-CM | POA: Diagnosis not present

## 2022-08-20 DIAGNOSIS — N390 Urinary tract infection, site not specified: Secondary | ICD-10-CM | POA: Diagnosis not present

## 2022-08-20 DIAGNOSIS — E1165 Type 2 diabetes mellitus with hyperglycemia: Secondary | ICD-10-CM | POA: Diagnosis not present

## 2022-08-20 DIAGNOSIS — J449 Chronic obstructive pulmonary disease, unspecified: Secondary | ICD-10-CM | POA: Diagnosis not present

## 2022-08-20 DIAGNOSIS — E1140 Type 2 diabetes mellitus with diabetic neuropathy, unspecified: Secondary | ICD-10-CM | POA: Diagnosis not present

## 2022-08-20 DIAGNOSIS — I119 Hypertensive heart disease without heart failure: Secondary | ICD-10-CM | POA: Diagnosis not present

## 2022-08-20 DIAGNOSIS — I519 Heart disease, unspecified: Secondary | ICD-10-CM | POA: Diagnosis not present

## 2022-08-20 DIAGNOSIS — Z794 Long term (current) use of insulin: Secondary | ICD-10-CM | POA: Diagnosis not present

## 2022-08-20 DIAGNOSIS — I69354 Hemiplegia and hemiparesis following cerebral infarction affecting left non-dominant side: Secondary | ICD-10-CM | POA: Diagnosis not present

## 2022-08-20 NOTE — Telephone Encounter (Signed)
VO given to [employer]

## 2022-08-20 NOTE — Telephone Encounter (Signed)
No answer from [facility] left detailed vm ok for VO

## 2022-08-24 DIAGNOSIS — J449 Chronic obstructive pulmonary disease, unspecified: Secondary | ICD-10-CM | POA: Diagnosis not present

## 2022-08-24 DIAGNOSIS — E1165 Type 2 diabetes mellitus with hyperglycemia: Secondary | ICD-10-CM | POA: Diagnosis not present

## 2022-08-24 DIAGNOSIS — I69354 Hemiplegia and hemiparesis following cerebral infarction affecting left non-dominant side: Secondary | ICD-10-CM | POA: Diagnosis not present

## 2022-08-24 DIAGNOSIS — I519 Heart disease, unspecified: Secondary | ICD-10-CM | POA: Diagnosis not present

## 2022-08-24 DIAGNOSIS — N390 Urinary tract infection, site not specified: Secondary | ICD-10-CM | POA: Diagnosis not present

## 2022-08-24 DIAGNOSIS — H5442A5 Blindness left eye category 5, normal vision right eye: Secondary | ICD-10-CM | POA: Diagnosis not present

## 2022-08-24 DIAGNOSIS — I119 Hypertensive heart disease without heart failure: Secondary | ICD-10-CM | POA: Diagnosis not present

## 2022-08-24 DIAGNOSIS — E1140 Type 2 diabetes mellitus with diabetic neuropathy, unspecified: Secondary | ICD-10-CM | POA: Diagnosis not present

## 2022-08-24 DIAGNOSIS — Z794 Long term (current) use of insulin: Secondary | ICD-10-CM | POA: Diagnosis not present

## 2022-08-25 ENCOUNTER — Encounter: Payer: Self-pay | Admitting: Nurse Practitioner

## 2022-08-25 ENCOUNTER — Ambulatory Visit: Payer: 59 | Admitting: Nurse Practitioner

## 2022-08-25 ENCOUNTER — Other Ambulatory Visit: Payer: Self-pay

## 2022-08-25 VITALS — BP 128/84 | HR 77 | Temp 97.9°F | Resp 16 | Ht 66.0 in | Wt 241.2 lb

## 2022-08-25 DIAGNOSIS — N3281 Overactive bladder: Secondary | ICD-10-CM

## 2022-08-25 DIAGNOSIS — N3000 Acute cystitis without hematuria: Secondary | ICD-10-CM

## 2022-08-25 DIAGNOSIS — E1169 Type 2 diabetes mellitus with other specified complication: Secondary | ICD-10-CM | POA: Diagnosis not present

## 2022-08-25 DIAGNOSIS — E1142 Type 2 diabetes mellitus with diabetic polyneuropathy: Secondary | ICD-10-CM | POA: Diagnosis not present

## 2022-08-25 DIAGNOSIS — Z7984 Long term (current) use of oral hypoglycemic drugs: Secondary | ICD-10-CM | POA: Diagnosis not present

## 2022-08-25 DIAGNOSIS — N179 Acute kidney failure, unspecified: Secondary | ICD-10-CM

## 2022-08-25 DIAGNOSIS — Z09 Encounter for follow-up examination after completed treatment for conditions other than malignant neoplasm: Secondary | ICD-10-CM

## 2022-08-25 DIAGNOSIS — E785 Hyperlipidemia, unspecified: Secondary | ICD-10-CM

## 2022-08-25 DIAGNOSIS — K567 Ileus, unspecified: Secondary | ICD-10-CM | POA: Diagnosis not present

## 2022-08-25 DIAGNOSIS — F330 Major depressive disorder, recurrent, mild: Secondary | ICD-10-CM

## 2022-08-25 MED ORDER — MIRABEGRON ER 50 MG PO TB24
50.0000 mg | ORAL_TABLET | Freq: Every day | ORAL | 3 refills | Status: DC
Start: 2022-08-25 — End: 2023-05-26

## 2022-08-25 MED ORDER — DULOXETINE HCL 60 MG PO CPEP
60.0000 mg | ORAL_CAPSULE | Freq: Every day | ORAL | 3 refills | Status: DC
Start: 2022-08-25 — End: 2022-10-26

## 2022-08-25 MED ORDER — EZETIMIBE 10 MG PO TABS
10.0000 mg | ORAL_TABLET | Freq: Every day | ORAL | 3 refills | Status: DC
Start: 2022-08-25 — End: 2023-05-26

## 2022-08-25 MED ORDER — ROSUVASTATIN CALCIUM 10 MG PO TABS
10.0000 mg | ORAL_TABLET | Freq: Every day | ORAL | 3 refills | Status: DC
Start: 2022-08-25 — End: 2022-09-11

## 2022-08-25 NOTE — Assessment & Plan Note (Signed)
improved

## 2022-08-25 NOTE — Assessment & Plan Note (Signed)
Refill sent to new pharmacy

## 2022-08-25 NOTE — Assessment & Plan Note (Signed)
Completed antibiotics while in hospital, reports she is doing well

## 2022-08-25 NOTE — Progress Notes (Signed)
BP 128/84   Pulse 77   Temp 97.9 F (36.6 C) (Oral)   Resp 16   Ht 5\' 6"  (1.676 m)   Wt 241 lb 3.2 oz (109.4 kg)   LMP  (LMP Unknown)   SpO2 98%   BMI 38.93 kg/m    Subjective:    Patient ID: [name], female    DOB: [birth year], 57 y.o.   MRN: [id number]  HPI: [name] is a 57 y.o. female  Chief Complaint  Patient presents with   Hospitalization Follow-up   Hospital follow up/acute cystitis/hyperglycemia Admitted on : 08/03/2022 at [facility] Discharged on: 08/11/2022 Medication reconciliation completed.  Medication changes: patient did stop taking medications as directed. Patient would like to restart her trulicity. Discussed with her PCP and advised due to her chronic abdominal issues she needs to follow up with endocrinology regarding restarting. Her last A1C was 9.7. she is currently taking Toujeo 20 units am and Humalog sliding scale, no longer on pump.  She reports that her blood sugar has been running 170s in morning.   She needs to talk to her endocrinologist. She last saw him on 04/16/2022.  She has an appointment 09/07/2022 with new endocrinologist.    She is currently getting pt and ot.    STOP taking these medications     cloNIDine 0.1 MG tablet Commonly known as: CATAPRES    mirabegron ER 50 MG Tb24 tablet Commonly known as: Myrbetriq    olmesartan 40 MG tablet Commonly known as: BENICAR    Trulicity 4.5 MG/0.5ML Sopn Generic drug: Dulaglutide     TAKE these medications     albuterol 1.25 MG/3ML nebulizer solution Commonly known as: ACCUNEB Take 1 ampule by nebulization 2 (two) times daily.    albuterol 108 (90 Base) MCG/ACT inhaler Commonly known as: VENTOLIN HFA Inhale 1-2 puffs into the lungs every 6 (six) hours as needed.    amitriptyline 25 MG tablet Commonly known as: ELAVIL Take 2-3 tablets (50-75 mg total) by mouth at bedtime.    amLODipine 10 MG tablet Commonly known as: NORVASC Take 10 mg by mouth daily.    blood  glucose meter kit and supplies Kit Dispense based on patient and insurance preference. Use up to four times daily as directed. (FOR E11.65 Z79.4).    BLOOD GLUCOSE TEST STRIPS Strp Use as directed to monitor blood sugar up to 4x a day FOR E11.65 Z79.4    butalbital-acetaminophen-caffeine 50-325-40 MG tablet Commonly known as: FIORICET Take 1-2 tablets by mouth every 6 (six) hours as needed for headache.    carvedilol 25 MG tablet Commonly known as: COREG Take 25 mg by mouth 2 (two) times daily.    cholecalciferol 25 MCG (1000 UNIT) tablet Commonly known as: VITAMIN D3 Take 1,000 Units by mouth daily.    clopidogrel 75 MG tablet Commonly known as: PLAVIX Take 1 tablet (75 mg total) by mouth daily.    cycloSPORINE 0.05 % ophthalmic emulsion Commonly known as: RESTASIS Place 2 drops into both eyes 2 (two) times daily.    DULoxetine 60 MG capsule Commonly known as: Cymbalta Take 1 capsule (60 mg total) by mouth daily.    EPINEPHrine 0.3 mg/0.3 mL Soaj injection Commonly known as: EPI-PEN Inject 0.3 mg into the muscle as needed for anaphylaxis.    ezetimibe 10 MG tablet Commonly known as: ZETIA Take 1 tablet (10 mg total) by mouth daily.    furosemide 20 MG tablet Commonly known as: LASIX Take 20 mg  by mouth daily.    gabapentin 600 MG tablet Commonly known as: Neurontin 600 mg qAM, 600 mg qPM, 1200 mg QHS    Gvoke HypoPen 2-Pack 1 MG/0.2ML Soaj Generic drug: Glucagon INJECT 1 MG INTO THE SKIN ONCE AS NEEDED FOR UP TO 1 DOSE (HYPOGLYCEMIA). ONLY FOR SEVERE LOW BLOOD SUGAR NOT RESPONDING TO DRINKING JUICE/GLUCOSE TABLETS, WHEN CBG IS <70    hydrALAZINE 50 MG tablet Commonly known as: APRESOLINE Take 1 tablet by mouth 3 (three) times daily.    insulin lispro 100 UNIT/ML KwikPen Commonly known as: HumaLOG KwikPen Inject up to 7 units before each meal What changed: additional instructions    Lancets Misc Use as directed to monitor blood sugar up to 4x a day FOR  E11.65 Z79.4    levETIRAcetam 500 MG tablet Commonly known as: KEPPRA Take 500-750 mg by mouth See admin instructions. Take 1 tablet (500mg ) by mouth every morning and take 1 tablets by mouth (750mg ) by mouth every night    meclizine 25 MG tablet Commonly known as: ANTIVERT Take 1 tablet (25 mg total) by mouth 3 (three) times daily as needed for dizziness.    pantoprazole 40 MG tablet Commonly known as: PROTONIX Take 1 tablet (40 mg total) by mouth daily.    polyethylene glycol 17 g packet Commonly known as: MIRALAX / GLYCOLAX Take 17 g by mouth daily as needed for mild constipation.    rosuvastatin 10 MG tablet Commonly known as: CRESTOR Take 1 tablet (10 mg total) by mouth daily.    senna-docusate 8.6-50 MG tablet Commonly known as: Senokot-S Take 1 tablet by mouth at bedtime as needed for mild constipation.    Toujeo Max SoloStar 300 UNIT/ML Solostar Pen Generic drug: insulin glargine (2 Unit Dial) 20 units in am What changed: additional instructions    Wixela Inhub 250-50 MCG/DOSE Aepb Generic drug: fluticasone-salmeterol Inhale 1 puff into the lungs 2 (two) times daily.        Latest Ref Rng & Units 08/09/2022    5:34 AM 08/08/2022    8:57 AM 08/04/2022    6:41 AM  CBC  WBC 4.0 - 10.5 K/uL 7.5  7.2  7.8   Hemoglobin 12.0 - 15.0 g/dL 16.1  09.6  04.5   Hematocrit 36.0 - 46.0 % 37.3  38.7  39.5   Platelets 150 - 400 K/uL 214  261  215        Latest Ref Rng & Units 08/10/2022    4:59 AM 08/09/2022    5:34 AM 08/08/2022    8:57 AM  CMP  Glucose 70 - 99 mg/dL  409  811   BUN 6 - 20 mg/dL  24  35   Creatinine 9.14 - 1.00 mg/dL 7.82  9.56  2.13   Sodium 135 - 145 mmol/L  134  136   Potassium 3.5 - 5.1 mmol/L  4.6  4.4   Chloride 98 - 111 mmol/L  107  105   CO2 22 - 32 mmol/L  21  25   Calcium 8.9 - 10.3 mg/dL  8.6  9.0     Notes from hospitalist: UTI (urinary tract infection), ruled out This has to be interpreted in the context of patient's urine culture from  July 19, 2022 which grew E. coli with intermediate sensitivity to ciprofloxacin.  At the time of patient's discharge on July 22, 2022.  Patient was advised to stop taking ciprofloxacin and be treated with Keflex.  Unfortunately patient took ciprofloxacin for approximately  7 days and subsequent to that only started taking Keflex.  Unfortunately patient has failed this treatment and now has ongoing symptoms of dysuria.  With associated leukocytes in the urine.  As well as mild lactic acidosis.  Additional factors for failure in this patient would include her uncontrolled diabetes mellitus with hyperglycemia.  Regardless for thoroughness I will do an ultrasound study of the kidney ureter bladder.  Lactic acidosis has resolved with IV fluids.  Not felt to represent sepsis at this time and may be due to hyperglycemia/DM itself.[name] Please folow up urine culture sent from ER. Rx with ceftraxone. S.p PO bactrim in ER X 1 Ceftriaxone 1 g IV daily, DC'd on 08/10/2022.   Resolved: Concern for ileus seen on CT scan done on 08/08/2022 Possible gastroparesis Had a couple of loose bowel movements, laxatives were held. Small meals throughout the day and avoiding dehydration. Mobilize as tolerated   Resolved AKI (acute kidney injury) (HCC) Improving, repeat creatinine was 1.15/eGFR 56 Patient's baseline sCr is 0.85-0.99/eGFR > 60 Continue sodium chloride infusion at 100 mL/h, for 1 more day Recheck BMP in the a.m.   Essential hypertension, benign Home amlodipine 10 mg daily, carvedilol 25 mg p.o. twice daily, hydralazine 50 mg every 8 hours, furosemide 20 mg daily   Dyslipidemia associated with type 2 diabetes mellitus (HCC) Rosuvastatin 10 mg daily   CAD (coronary artery disease) Plavix, rosuvastatin, antihypertensive medication control, continue diabetes mellitus management   Uncontrolled type 2 diabetes mellitus with hyperglycemia (HCC) Reduce insulin doses to avoid hypoglycemia Hemoglobin today 8A.  Today I  will A1c.  9.7 on 07/20/22   Hemiparesis affecting left side as late effect of cerebrovascular accident (CVA) (HCC) Home Plavix 75 mg daily, rosuvastatin 10 mg daily, antihypertensive medication, optimize diabetes mellitus management   Physical debility PT OT assessment Fall precautions.  Relevant past medical, surgical, family and social history reviewed and updated as indicated. Interim medical history since our last visit reviewed. Allergies and medications reviewed and updated.  Review of Systems  Constitutional: Negative for fever or weight change.  Respiratory: Negative for cough and shortness of breath.   Cardiovascular: Negative for chest pain or palpitations.  Gastrointestinal: Negative for abdominal pain, no bowel changes.  Musculoskeletal: Negative for gait problem or joint swelling.  Skin: Negative for rash.  Neurological: Negative for dizziness or headache.  No other specific complaints in a complete review of systems (except as listed in HPI above).      Objective:    BP 128/84   Pulse 77   Temp 97.9 F (36.6 C) (Oral)   Resp 16   Ht 5\' 6"  (1.676 m)   Wt 241 lb 3.2 oz (109.4 kg)   LMP  (LMP Unknown)   SpO2 98%   BMI 38.93 kg/m   Wt Readings from Last 3 Encounters:  08/25/22 241 lb 3.2 oz (109.4 kg)  08/11/22 247 lb 5.7 oz (112.2 kg)  07/20/22 242 lb 8.1 oz (110 kg)    Physical Exam  Constitutional: Patient appears well-developed and well-nourished. Obese  No distress.  HEENT: head atraumatic, normocephalic, pupils equal and reactive to light, neck supple, throat within normal limits Cardiovascular: Normal rate, regular rhythm and normal heart sounds.  No murmur heard. No BLE edema. Pulmonary/Chest: Effort normal and breath sounds normal. No respiratory distress. Abdominal: Soft.  There is no tenderness. Psychiatric: Patient has a normal mood and affect. behavior is normal. Judgment and thought content normal.   Results for orders placed or performed  during the hospital encounter of 08/03/22  Urine Culture (for pregnant, neutropenic or urologic patients or patients with an indwelling urinary catheter)   Specimen: Urine, Random  Result Value Ref Range   Specimen Description      URINE, RANDOM Performed at [facility], [address] [place], [place] [address]    Special Requests      NONE Performed at [facility], [address] [place], [place] [address]    Culture MULTIPLE SPECIES PRESENT, SUGGEST RECOLLECTION (A)    Report Status 08/05/2022 FINAL   Urine Culture   Specimen: Urine, Random  Result Value Ref Range   Specimen Description      URINE, RANDOM Performed at [facility], [address] [place], [place] [address]    Special Requests      NONE Reflexed from [id number] Performed at [facility], [address] [place], [place] [address]    Culture 80,000 COLONIES/mL YEAST (A)    Report Status 08/08/2022 FINAL   Comprehensive metabolic panel  Result Value Ref Range   Sodium 133 (L) 135 - 145 mmol/L   Potassium 3.8 3.5 - 5.1 mmol/L   Chloride 101 98 - 111 mmol/L   CO2 22 22 - 32 mmol/L   Glucose, Bld 435 (H) 70 - 99 mg/dL   BUN 15 6 - 20 mg/dL   Creatinine, Ser 1.09 0.44 - 1.00 mg/dL   Calcium 8.8 (L) 8.9 - 10.3 mg/dL   Total Protein 7.7 6.5 - 8.1 g/dL   Albumin 3.8 3.5 - 5.0 g/dL   AST 16 15 - 41 U/L   ALT 27 0 - 44 U/L   Alkaline Phosphatase 99 38 - 126 U/L   Total Bilirubin 0.5 0.3 - 1.2 mg/dL   GFR, Estimated >32 >35 mL/min   Anion gap 10 5 - 15  CBC with Differential  Result Value Ref Range   WBC 7.1 4.0 - 10.5 K/uL   RBC 4.68 3.87 - 5.11 MIL/uL   Hemoglobin 13.5 12.0 - 15.0 g/dL   HCT 57.3 22.0 - 25.4 %   MCV 90.4 80.0 - 100.0 fL   MCH 28.8 26.0 - 34.0 pg   MCHC 31.9 30.0 - 36.0 g/dL   RDW 27.0 62.3 - 76.2 %   Platelets 265 150 - 400 K/uL   nRBC 0.0 0.0 - 0.2 %   Neutrophils Relative % 69 %   Neutro Abs 4.8 1.7 - 7.7 K/uL   Lymphocytes Relative 24 %    Lymphs Abs 1.7 0.7 - 4.0 K/uL   Monocytes Relative 7 %   Monocytes Absolute 0.5 0.1 - 1.0 K/uL   Eosinophils Relative 0 %   Eosinophils Absolute 0.0 0.0 - 0.5 K/uL   Basophils Relative 0 %   Basophils Absolute 0.0 0.0 - 0.1 K/uL   Immature Granulocytes 0 %   Abs Immature Granulocytes 0.03 0.00 - 0.07 K/uL  Blood gas, venous  Result Value Ref Range   pH, Ven 7.41 7.25 - 7.43   pCO2, Ven 40 (L) 44 - 60 mmHg   pO2, Ven 40 32 - 45 mmHg   Bicarbonate 25.4 20.0 - 28.0 mmol/L   Acid-Base Excess 0.7 0.0 - 2.0 mmol/L   O2 Saturation 66.5 %   Patient temperature 37.0    Collection site VENOUS   Urinalysis, Routine w reflex microscopic -Urine, Clean Catch  Result Value Ref Range   Color, Urine STRAW (A) YELLOW   APPearance CLEAR (A) CLEAR  Specific Gravity, Urine 1.011 1.005 - 1.030   pH 5.0 5.0 - 8.0   Glucose, UA >=500 (A) NEGATIVE mg/dL   Hgb urine dipstick NEGATIVE NEGATIVE   Bilirubin Urine NEGATIVE NEGATIVE   Ketones, ur NEGATIVE NEGATIVE mg/dL   Protein, ur NEGATIVE NEGATIVE mg/dL   Nitrite NEGATIVE NEGATIVE   Leukocytes,Ua MODERATE (A) NEGATIVE   RBC / HPF 0-5 0 - 5 RBC/hpf   WBC, UA 11-20 0 - 5 WBC/hpf   Bacteria, UA RARE (A) NONE SEEN   Squamous Epithelial / HPF 0-5 0 - 5 /HPF   Mucus PRESENT    Hyaline Casts, UA PRESENT   Lactic acid, plasma  Result Value Ref Range   Lactic Acid, Venous 2.6 (HH) 0.5 - 1.9 mmol/L  Lactic acid, plasma  Result Value Ref Range   Lactic Acid, Venous 1.6 0.5 - 1.9 mmol/L  Beta-hydroxybutyric acid  Result Value Ref Range   Beta-Hydroxybutyric Acid 0.19 0.05 - 0.27 mmol/L  Basic metabolic panel  Result Value Ref Range   Sodium 135 135 - 145 mmol/L   Potassium 4.9 3.5 - 5.1 mmol/L   Chloride 104 98 - 111 mmol/L   CO2 19 (L) 22 - 32 mmol/L   Glucose, Bld 374 (H) 70 - 99 mg/dL   BUN 17 6 - 20 mg/dL   Creatinine, Ser 8.29 0.44 - 1.00 mg/dL   Calcium 9.2 8.9 - 56.2 mg/dL   GFR, Estimated >13 >08 mL/min   Anion gap 12 5 - 15  CBC   Result Value Ref Range   WBC 7.8 4.0 - 10.5 K/uL   RBC 4.57 3.87 - 5.11 MIL/uL   Hemoglobin 13.2 12.0 - 15.0 g/dL   HCT 65.7 84.6 - 96.2 %   MCV 86.4 80.0 - 100.0 fL   MCH 28.9 26.0 - 34.0 pg   MCHC 33.4 30.0 - 36.0 g/dL   RDW 95.2 84.1 - 32.4 %   Platelets 215 150 - 400 K/uL   nRBC 0.0 0.0 - 0.2 %  Glucose, capillary  Result Value Ref Range   Glucose-Capillary 347 (H) 70 - 99 mg/dL  Protime-INR  Result Value Ref Range   Prothrombin Time 13.9 11.4 - 15.2 seconds   INR 1.1 0.8 - 1.2  APTT  Result Value Ref Range   aPTT 27 24 - 36 seconds  Glucose, capillary  Result Value Ref Range   Glucose-Capillary 343 (H) 70 - 99 mg/dL  Glucose, capillary  Result Value Ref Range   Glucose-Capillary 317 (H) 70 - 99 mg/dL  Glucose, capillary  Result Value Ref Range   Glucose-Capillary 198 (H) 70 - 99 mg/dL  Glucose, capillary  Result Value Ref Range   Glucose-Capillary 162 (H) 70 - 99 mg/dL  Glucose, capillary  Result Value Ref Range   Glucose-Capillary 316 (H) 70 - 99 mg/dL   Comment 1 Notify RN    Comment 2 Document in Chart   Glucose, capillary  Result Value Ref Range   Glucose-Capillary 323 (H) 70 - 99 mg/dL   Comment 1 Notify RN    Comment 2 Document in Chart   Urinalysis, w/ Reflex to Culture (Infection Suspected) -Urine, Clean Catch  Result Value Ref Range   Specimen Source URINE, RANDOM    Color, Urine YELLOW (A) YELLOW   APPearance CLOUDY (A) CLEAR   Specific Gravity, Urine 1.015 1.005 - 1.030   pH 5.0 5.0 - 8.0   Glucose, UA NEGATIVE NEGATIVE mg/dL   Hgb urine dipstick NEGATIVE NEGATIVE  Bilirubin Urine NEGATIVE NEGATIVE   Ketones, ur NEGATIVE NEGATIVE mg/dL   Protein, ur NEGATIVE NEGATIVE mg/dL   Nitrite NEGATIVE NEGATIVE   Leukocytes,Ua LARGE (A) NEGATIVE   RBC / HPF >50 0 - 5 RBC/hpf   WBC, UA 21-50 0 - 5 WBC/hpf   Bacteria, UA RARE (A) NONE SEEN   Squamous Epithelial / HPF 0-5 0 - 5 /HPF   Mucus PRESENT    Budding Yeast PRESENT    Hyphae Yeast PRESENT     Hyaline Casts, UA PRESENT   Glucose, capillary  Result Value Ref Range   Glucose-Capillary 165 (H) 70 - 99 mg/dL   Comment 1 Notify RN    Comment 2 Document in Chart   Basic metabolic panel  Result Value Ref Range   Sodium 131 (L) 135 - 145 mmol/L   Potassium 4.5 3.5 - 5.1 mmol/L   Chloride 100 98 - 111 mmol/L   CO2 22 22 - 32 mmol/L   Glucose, Bld 167 (H) 70 - 99 mg/dL   BUN 42 (H) 6 - 20 mg/dL   Creatinine, Ser 4.09 (H) 0.44 - 1.00 mg/dL   Calcium 8.9 8.9 - 81.1 mg/dL   GFR, Estimated 31 (L) >60 mL/min   Anion gap 9 5 - 15  Glucose, capillary  Result Value Ref Range   Glucose-Capillary 141 (H) 70 - 99 mg/dL  Glucose, capillary  Result Value Ref Range   Glucose-Capillary 119 (H) 70 - 99 mg/dL  Glucose, capillary  Result Value Ref Range   Glucose-Capillary 133 (H) 70 - 99 mg/dL  Glucose, capillary  Result Value Ref Range   Glucose-Capillary 199 (H) 70 - 99 mg/dL  Glucose, capillary  Result Value Ref Range   Glucose-Capillary 235 (H) 70 - 99 mg/dL  Basic metabolic panel  Result Value Ref Range   Sodium 134 (L) 135 - 145 mmol/L   Potassium 4.4 3.5 - 5.1 mmol/L   Chloride 99 98 - 111 mmol/L   CO2 25 22 - 32 mmol/L   Glucose, Bld 267 (H) 70 - 99 mg/dL   BUN 45 (H) 6 - 20 mg/dL   Creatinine, Ser 9.14 (H) 0.44 - 1.00 mg/dL   Calcium 9.1 8.9 - 78.2 mg/dL   GFR, Estimated 41 (L) >60 mL/min   Anion gap 10 5 - 15  Glucose, capillary  Result Value Ref Range   Glucose-Capillary 357 (H) 70 - 99 mg/dL  Glucose, capillary  Result Value Ref Range   Glucose-Capillary 153 (H) 70 - 99 mg/dL  Glucose, capillary  Result Value Ref Range   Glucose-Capillary 279 (H) 70 - 99 mg/dL   Comment 1 Notify RN    Comment 2 Document in Chart   Glucose, capillary  Result Value Ref Range   Glucose-Capillary 189 (H) 70 - 99 mg/dL   Comment 1 Notify RN    Comment 2 Document in Chart   Glucose, capillary  Result Value Ref Range   Glucose-Capillary 225 (H) 70 - 99 mg/dL   Comment 1 Notify  RN    Comment 2 Document in Chart   Glucose, capillary  Result Value Ref Range   Glucose-Capillary 169 (H) 70 - 99 mg/dL  Glucose, capillary  Result Value Ref Range   Glucose-Capillary 162 (H) 70 - 99 mg/dL  Basic metabolic panel  Result Value Ref Range   Sodium 136 135 - 145 mmol/L   Potassium 4.4 3.5 - 5.1 mmol/L   Chloride 105 98 - 111 mmol/L   CO2  25 22 - 32 mmol/L   Glucose, Bld 174 (H) 70 - 99 mg/dL   BUN 35 (H) 6 - 20 mg/dL   Creatinine, Ser 4.09 (H) 0.44 - 1.00 mg/dL   Calcium 9.0 8.9 - 81.1 mg/dL   GFR, Estimated 56 (L) >60 mL/min   Anion gap 6 5 - 15  CBC  Result Value Ref Range   WBC 7.2 4.0 - 10.5 K/uL   RBC 4.26 3.87 - 5.11 MIL/uL   Hemoglobin 12.4 12.0 - 15.0 g/dL   HCT 91.4 78.2 - 95.6 %   MCV 90.8 80.0 - 100.0 fL   MCH 29.1 26.0 - 34.0 pg   MCHC 32.0 30.0 - 36.0 g/dL   RDW 21.3 08.6 - 57.8 %   Platelets 261 150 - 400 K/uL   nRBC 0.0 0.0 - 0.2 %  Glucose, capillary  Result Value Ref Range   Glucose-Capillary 120 (H) 70 - 99 mg/dL  Glucose, capillary  Result Value Ref Range   Glucose-Capillary 149 (H) 70 - 99 mg/dL  Basic metabolic panel  Result Value Ref Range   Sodium 134 (L) 135 - 145 mmol/L   Potassium 4.6 3.5 - 5.1 mmol/L   Chloride 107 98 - 111 mmol/L   CO2 21 (L) 22 - 32 mmol/L   Glucose, Bld 114 (H) 70 - 99 mg/dL   BUN 24 (H) 6 - 20 mg/dL   Creatinine, Ser 4.69 0.44 - 1.00 mg/dL   Calcium 8.6 (L) 8.9 - 10.3 mg/dL   GFR, Estimated >62 >95 mL/min   Anion gap 6 5 - 15  CBC  Result Value Ref Range   WBC 7.5 4.0 - 10.5 K/uL   RBC 4.02 3.87 - 5.11 MIL/uL   Hemoglobin 11.8 (L) 12.0 - 15.0 g/dL   HCT 28.4 13.2 - 44.0 %   MCV 92.8 80.0 - 100.0 fL   MCH 29.4 26.0 - 34.0 pg   MCHC 31.6 30.0 - 36.0 g/dL   RDW 10.2 72.5 - 36.6 %   Platelets 214 150 - 400 K/uL   nRBC 0.0 0.0 - 0.2 %  Glucose, capillary  Result Value Ref Range   Glucose-Capillary 119 (H) 70 - 99 mg/dL  Glucose, capillary  Result Value Ref Range   Glucose-Capillary 92 70 - 99  mg/dL  Glucose, capillary  Result Value Ref Range   Glucose-Capillary 97 70 - 99 mg/dL  Glucose, capillary  Result Value Ref Range   Glucose-Capillary 201 (H) 70 - 99 mg/dL  Glucose, capillary  Result Value Ref Range   Glucose-Capillary 214 (H) 70 - 99 mg/dL  Creatinine, serum  Result Value Ref Range   Creatinine, Ser 0.88 0.44 - 1.00 mg/dL   GFR, Estimated >44 >03 mL/min  Glucose, capillary  Result Value Ref Range   Glucose-Capillary 170 (H) 70 - 99 mg/dL  Glucose, capillary  Result Value Ref Range   Glucose-Capillary 154 (H) 70 - 99 mg/dL  Glucose, capillary  Result Value Ref Range   Glucose-Capillary 195 (H) 70 - 99 mg/dL  Glucose, capillary  Result Value Ref Range   Glucose-Capillary 170 (H) 70 - 99 mg/dL  Glucose, capillary  Result Value Ref Range   Glucose-Capillary 217 (H) 70 - 99 mg/dL   Comment 1 Notify RN   Glucose, capillary  Result Value Ref Range   Glucose-Capillary 201 (H) 70 - 99 mg/dL  Glucose, capillary  Result Value Ref Range   Glucose-Capillary 204 (H) 70 - 99 mg/dL  CBG monitoring, ED  Result Value Ref Range   Glucose-Capillary 398 (H) 70 - 99 mg/dL  CBG monitoring, ED  Result Value Ref Range   Glucose-Capillary 216 (H) 70 - 99 mg/dL   *Note: Due to a large number of results and/or encounters for the requested time period, some results have not been displayed. A complete set of results can be found in Results Review.      Assessment & Plan:   Problem List Items Addressed This Visit       Endocrine   Dyslipidemia associated with type 2 diabetes mellitus (HCC)    Refill sent to new pharmacy      Relevant Medications   rosuvastatin (CRESTOR) 10 MG tablet   ezetimibe (ZETIA) 10 MG tablet   Diabetic polyneuropathy associated with type 2 diabetes mellitus (HCC)    Refill sent to new pharmacy      Relevant Medications   rosuvastatin (CRESTOR) 10 MG tablet   DULoxetine (CYMBALTA) 60 MG capsule     Genitourinary   Overactive bladder     Refill sent to new pharmacy      Relevant Medications   mirabegron ER (MYRBETRIQ) 50 MG TB24 tablet   AKI (acute kidney injury) (HCC)    improved      UTI (urinary tract infection)    Completed antibiotics while in hospital, reports she is doing well        Other   Mild episode of recurrent major depressive disorder (HCC)    Refill sent to new pharmacy      Relevant Medications   DULoxetine (CYMBALTA) 60 MG capsule   Other Visit Diagnoses     Hospital discharge follow-up    -  Primary   doing well, has f/u with endo on 8/19,  is currently doing ot/pt   [facility])       doing well, stopped trulicity, will wait to discuss with endo regarding if she should restart       She needed her medications sent to a different pharmacy.    Follow up plan: Return if symptoms worsen or fail to improve.

## 2022-08-26 DIAGNOSIS — I69354 Hemiplegia and hemiparesis following cerebral infarction affecting left non-dominant side: Secondary | ICD-10-CM | POA: Diagnosis not present

## 2022-08-26 DIAGNOSIS — E1140 Type 2 diabetes mellitus with diabetic neuropathy, unspecified: Secondary | ICD-10-CM | POA: Diagnosis not present

## 2022-08-26 DIAGNOSIS — J449 Chronic obstructive pulmonary disease, unspecified: Secondary | ICD-10-CM | POA: Diagnosis not present

## 2022-08-26 DIAGNOSIS — N390 Urinary tract infection, site not specified: Secondary | ICD-10-CM | POA: Diagnosis not present

## 2022-08-26 DIAGNOSIS — I119 Hypertensive heart disease without heart failure: Secondary | ICD-10-CM | POA: Diagnosis not present

## 2022-08-26 DIAGNOSIS — Z794 Long term (current) use of insulin: Secondary | ICD-10-CM | POA: Diagnosis not present

## 2022-08-26 DIAGNOSIS — E1165 Type 2 diabetes mellitus with hyperglycemia: Secondary | ICD-10-CM | POA: Diagnosis not present

## 2022-08-26 DIAGNOSIS — I519 Heart disease, unspecified: Secondary | ICD-10-CM | POA: Diagnosis not present

## 2022-08-26 DIAGNOSIS — H5442A5 Blindness left eye category 5, normal vision right eye: Secondary | ICD-10-CM | POA: Diagnosis not present

## 2022-08-31 ENCOUNTER — Ambulatory Visit: Payer: Self-pay | Admitting: *Deleted

## 2022-08-31 DIAGNOSIS — I119 Hypertensive heart disease without heart failure: Secondary | ICD-10-CM | POA: Diagnosis not present

## 2022-08-31 DIAGNOSIS — J449 Chronic obstructive pulmonary disease, unspecified: Secondary | ICD-10-CM | POA: Diagnosis not present

## 2022-08-31 DIAGNOSIS — I519 Heart disease, unspecified: Secondary | ICD-10-CM | POA: Diagnosis not present

## 2022-08-31 DIAGNOSIS — Z794 Long term (current) use of insulin: Secondary | ICD-10-CM | POA: Diagnosis not present

## 2022-08-31 DIAGNOSIS — E1165 Type 2 diabetes mellitus with hyperglycemia: Secondary | ICD-10-CM | POA: Diagnosis not present

## 2022-08-31 DIAGNOSIS — N390 Urinary tract infection, site not specified: Secondary | ICD-10-CM | POA: Diagnosis not present

## 2022-08-31 DIAGNOSIS — E1140 Type 2 diabetes mellitus with diabetic neuropathy, unspecified: Secondary | ICD-10-CM | POA: Diagnosis not present

## 2022-08-31 DIAGNOSIS — H5442A5 Blindness left eye category 5, normal vision right eye: Secondary | ICD-10-CM | POA: Diagnosis not present

## 2022-08-31 DIAGNOSIS — I69354 Hemiplegia and hemiparesis following cerebral infarction affecting left non-dominant side: Secondary | ICD-10-CM | POA: Diagnosis not present

## 2022-08-31 NOTE — Telephone Encounter (Signed)
  Chief Complaint: Bilateral hip pain Symptoms: 8/10 pain both hips, radiates down both legs. Frequency: 2 days ago Pertinent Negatives: Patient denies redness, swelling, no fall, injury. Disposition: [] ED /[] Urgent Care (no appt availability in office) / [x] Appointment(In office/virtual)/ []  [place] [facility]/ [] Home Care/ [] Refused Recommended Disposition /[] [place] [facility]/ []  Follow-up with PCP Additional Notes: Pt states "Different type of pain then the neuropathy, they went up on my Gabapentin but this is different." Can't make appt today due to transportation. Appt secured for tomorrow. Advised ED for worsening symptoms. Pt verbalizes understanding.  Reason for Disposition  [1] SEVERE pain (e.g., excruciating, unable to do any normal activities) AND [2] not improved after 2 hours of pain medicine  Answer Assessment - Initial Assessment Questions 1. LOCATION and RADIATION: "Where is the pain located?"      Both hips 2. QUALITY: "What does the pain feel like?"  (e.g., sharp, dull, aching, burning)     Sharp pain 3. SEVERITY: "How bad is the pain?" "What does it keep you from doing?"   (Scale 1-10; or mild, moderate, severe)   -  MILD (1-3): doesn't interfere with normal activities    -  MODERATE (4-7): interferes with normal activities (e.g., work or school) or awakens from sleep, limping    -  SEVERE (8-10): excruciating pain, unable to do any normal activities, unable to walk     8/10 4. ONSET: "When did the pain start?" "Does it come and go, or is it there all the time?"     2 days ago 5. WORK OR EXERCISE: "Has there been any recent work or exercise that involved this part of the body?"      No 6. CAUSE: "What do you think is causing the hip pain?"      Unsure 7. AGGRAVATING FACTORS: "What makes the hip pain worse?" (e.g., walking, climbing stairs, running)      8. OTHER SYMPTOMS: "Do you have any other symptoms?" (e.g., back pain, pain shooting down leg,  fever,  rash)     Yes, both legs  Protocols used: Hip Pain-A-AH

## 2022-09-01 ENCOUNTER — Ambulatory Visit (INDEPENDENT_AMBULATORY_CARE_PROVIDER_SITE_OTHER): Payer: 59 | Admitting: Internal Medicine

## 2022-09-01 ENCOUNTER — Encounter: Payer: Self-pay | Admitting: Internal Medicine

## 2022-09-01 VITALS — BP 116/80 | HR 84 | Temp 98.0°F | Resp 18 | Ht 66.0 in | Wt 240.5 lb

## 2022-09-01 DIAGNOSIS — J449 Chronic obstructive pulmonary disease, unspecified: Secondary | ICD-10-CM | POA: Diagnosis not present

## 2022-09-01 DIAGNOSIS — M25552 Pain in left hip: Secondary | ICD-10-CM | POA: Diagnosis not present

## 2022-09-01 DIAGNOSIS — M25551 Pain in right hip: Secondary | ICD-10-CM

## 2022-09-01 DIAGNOSIS — Z8673 Personal history of transient ischemic attack (TIA), and cerebral infarction without residual deficits: Secondary | ICD-10-CM | POA: Diagnosis not present

## 2022-09-01 DIAGNOSIS — G4733 Obstructive sleep apnea (adult) (pediatric): Secondary | ICD-10-CM | POA: Diagnosis not present

## 2022-09-01 DIAGNOSIS — I10 Essential (primary) hypertension: Secondary | ICD-10-CM | POA: Diagnosis not present

## 2022-09-01 MED ORDER — TIZANIDINE HCL 4 MG PO TABS
4.0000 mg | ORAL_TABLET | Freq: Every evening | ORAL | 0 refills | Status: DC | PRN
Start: 2022-09-01 — End: 2023-01-26

## 2022-09-01 NOTE — Progress Notes (Signed)
Acute Office Visit  Subjective:     Patient ID: [name], female    DOB: 08-Apr-1965, 57 y.o.   MRN: [id number]  Chief Complaint  Patient presents with   Hip Pain    bilateral    HPI Patient is in today for bilateral hip pain.  Patient is new to me.  She states she has chronic bilateral sciatica and diabetic neuropathy but since she got out of the hospital last month she has been having severe bilateral hip pain.  Pain is worse in the left but is present in the right.  Appears to be worse with walking but also prolonged sitting.  Pain will radiate from hip joint to the medial side of her knee, sometimes down into her left foot.  She does have a pain management provider who recommended she increase her gabapentin. She is currently working with a physical therapist and occupational therapist.  She does walk with a walker.  She is currently living with her daughter.  HIP PAIN Duration: weeks Involved hip: bilateral  Mechanism of injury: unknown Location: medial Onset: gradual  Quality: pressure-like and pulling Frequency: constant Radiation: yes Aggravating factors: weight bearing, walking, and prolonged sitting   Alleviating factors: nothing  Status: worse Treatments attempted: rest, APAP, and physical therapy   Relief with NSAIDs?: No NSAIDs Taken, contraindicated Weakness with weight bearing: yes Weakness with walking: yes Paresthesias / decreased sensation: no Swelling: no Redness:no Fevers: no   Review of Systems  Constitutional:  Negative for chills and fever.  Musculoskeletal:  Positive for joint pain.  Skin:  Negative for itching and rash.        Objective:    BP 116/80   Pulse 84   Temp 98 F (36.7 C)   Resp 18   Ht 5\' 6"  (1.676 m)   Wt 240 lb 8 oz (109.1 kg)   LMP  (LMP Unknown)   SpO2 98%   BMI 38.82 kg/m  BP Readings from Last 3 Encounters:  09/01/22 116/80  08/25/22 128/84  08/11/22 125/82   Wt Readings from Last 3 Encounters:   09/01/22 240 lb 8 oz (109.1 kg)  08/25/22 241 lb 3.2 oz (109.4 kg)  08/11/22 247 lb 5.7 oz (112.2 kg)      Physical Exam Constitutional:      Appearance: Normal appearance.  HENT:     Head: Normocephalic and atraumatic.  Eyes:     Conjunctiva/sclera: Conjunctivae normal.  Cardiovascular:     Rate and Rhythm: Normal rate and regular rhythm.  Pulmonary:     Effort: Pulmonary effort is normal.     Breath sounds: Normal breath sounds.  Musculoskeletal:     Right hip: Tenderness present. No bony tenderness or crepitus. Normal range of motion.     Left hip: Tenderness present. No bony tenderness or crepitus. Normal range of motion.     Comments: Pain located at the bilateral greater trochanter with good ROM  Skin:    General: Skin is warm and dry.  Neurological:     General: No focal deficit present.     Mental Status: She is alert. Mental status is at baseline.  Psychiatric:        Mood and Affect: Mood normal.        Behavior: Behavior normal.     No results found for any visits on 09/01/22.      Assessment & Plan:   1. Bilateral hip pain: Will obtain x-rays to evaluate the joint space,  however I do think her physical exam is more consistent with strain of the hip flexors versus trochanteric bursitis.  For now I will send a muscle relaxer to her pharmacy and she will continue to work with her physical therapist. I would not recommend anti-inflammatories, she is currently on Plavix. She also was recently hospitalized for uncontrolled diabetes so I do not recommend steroids at this time either.  I think for now conservative management with gentle stretching and muscle relaxer is the best course, recommend she follow-up with her PCP and once her sugars are better controlled could consider steroid injection.  - tiZANidine (ZANAFLEX) 4 MG tablet; Take 1 tablet (4 mg total) by mouth at bedtime as needed.  Dispense: 30 tablet; Refill: 0 - DG Hip Unilat W OR W/O Pelvis 2-3 Views Left;  Future - DG Hip Unilat W OR W/O Pelvis 2-3 Views Right; Future  Return if symptoms worsen or fail to improve.  Margarita Mail, DO

## 2022-09-02 DIAGNOSIS — N390 Urinary tract infection, site not specified: Secondary | ICD-10-CM | POA: Diagnosis not present

## 2022-09-02 DIAGNOSIS — H5442A5 Blindness left eye category 5, normal vision right eye: Secondary | ICD-10-CM | POA: Diagnosis not present

## 2022-09-02 DIAGNOSIS — E1140 Type 2 diabetes mellitus with diabetic neuropathy, unspecified: Secondary | ICD-10-CM | POA: Diagnosis not present

## 2022-09-02 DIAGNOSIS — I519 Heart disease, unspecified: Secondary | ICD-10-CM | POA: Diagnosis not present

## 2022-09-02 DIAGNOSIS — E1165 Type 2 diabetes mellitus with hyperglycemia: Secondary | ICD-10-CM | POA: Diagnosis not present

## 2022-09-02 DIAGNOSIS — Z794 Long term (current) use of insulin: Secondary | ICD-10-CM | POA: Diagnosis not present

## 2022-09-02 DIAGNOSIS — I119 Hypertensive heart disease without heart failure: Secondary | ICD-10-CM | POA: Diagnosis not present

## 2022-09-02 DIAGNOSIS — I69354 Hemiplegia and hemiparesis following cerebral infarction affecting left non-dominant side: Secondary | ICD-10-CM | POA: Diagnosis not present

## 2022-09-02 DIAGNOSIS — J449 Chronic obstructive pulmonary disease, unspecified: Secondary | ICD-10-CM | POA: Diagnosis not present

## 2022-09-03 DIAGNOSIS — I10 Essential (primary) hypertension: Secondary | ICD-10-CM | POA: Diagnosis not present

## 2022-09-03 DIAGNOSIS — J449 Chronic obstructive pulmonary disease, unspecified: Secondary | ICD-10-CM | POA: Diagnosis not present

## 2022-09-03 DIAGNOSIS — G4733 Obstructive sleep apnea (adult) (pediatric): Secondary | ICD-10-CM | POA: Diagnosis not present

## 2022-09-03 DIAGNOSIS — Z8673 Personal history of transient ischemic attack (TIA), and cerebral infarction without residual deficits: Secondary | ICD-10-CM | POA: Diagnosis not present

## 2022-09-04 ENCOUNTER — Other Ambulatory Visit: Payer: Self-pay | Admitting: Family Medicine

## 2022-09-04 DIAGNOSIS — E1165 Type 2 diabetes mellitus with hyperglycemia: Secondary | ICD-10-CM

## 2022-09-05 ENCOUNTER — Encounter: Payer: Self-pay | Admitting: Endocrinology

## 2022-09-07 ENCOUNTER — Telehealth: Payer: Self-pay

## 2022-09-07 ENCOUNTER — Encounter: Payer: Self-pay | Admitting: "Endocrinology

## 2022-09-07 ENCOUNTER — Ambulatory Visit (INDEPENDENT_AMBULATORY_CARE_PROVIDER_SITE_OTHER): Payer: 59 | Admitting: "Endocrinology

## 2022-09-07 ENCOUNTER — Other Ambulatory Visit: Payer: Self-pay

## 2022-09-07 ENCOUNTER — Telehealth: Payer: Self-pay | Admitting: "Endocrinology

## 2022-09-07 VITALS — BP 115/80 | HR 75 | Ht 66.0 in | Wt 247.0 lb

## 2022-09-07 DIAGNOSIS — E1165 Type 2 diabetes mellitus with hyperglycemia: Secondary | ICD-10-CM | POA: Diagnosis not present

## 2022-09-07 DIAGNOSIS — I519 Heart disease, unspecified: Secondary | ICD-10-CM | POA: Diagnosis not present

## 2022-09-07 DIAGNOSIS — I69354 Hemiplegia and hemiparesis following cerebral infarction affecting left non-dominant side: Secondary | ICD-10-CM | POA: Diagnosis not present

## 2022-09-07 DIAGNOSIS — Z7984 Long term (current) use of oral hypoglycemic drugs: Secondary | ICD-10-CM | POA: Diagnosis not present

## 2022-09-07 DIAGNOSIS — I119 Hypertensive heart disease without heart failure: Secondary | ICD-10-CM | POA: Diagnosis not present

## 2022-09-07 DIAGNOSIS — E782 Mixed hyperlipidemia: Secondary | ICD-10-CM | POA: Diagnosis not present

## 2022-09-07 DIAGNOSIS — Z794 Long term (current) use of insulin: Secondary | ICD-10-CM

## 2022-09-07 DIAGNOSIS — J449 Chronic obstructive pulmonary disease, unspecified: Secondary | ICD-10-CM | POA: Diagnosis not present

## 2022-09-07 DIAGNOSIS — N390 Urinary tract infection, site not specified: Secondary | ICD-10-CM | POA: Diagnosis not present

## 2022-09-07 DIAGNOSIS — E1140 Type 2 diabetes mellitus with diabetic neuropathy, unspecified: Secondary | ICD-10-CM | POA: Diagnosis not present

## 2022-09-07 DIAGNOSIS — H5442A5 Blindness left eye category 5, normal vision right eye: Secondary | ICD-10-CM | POA: Diagnosis not present

## 2022-09-07 LAB — GLUCOSE, POCT (MANUAL RESULT ENTRY): POC Glucose: 297 mg/dl — AB (ref 70–99)

## 2022-09-07 MED ORDER — LANCETS MISC. MISC: 1.0000 | Freq: Three times a day (TID) | 3 refills | Status: AC

## 2022-09-07 MED ORDER — TOUJEO MAX SOLOSTAR 300 UNIT/ML ~~LOC~~ SOPN
PEN_INJECTOR | SUBCUTANEOUS | 0 refills | Status: DC
Start: 2022-09-07 — End: 2022-09-16

## 2022-09-07 MED ORDER — INSULIN LISPRO (1 UNIT DIAL) 100 UNIT/ML (KWIKPEN)
PEN_INJECTOR | SUBCUTANEOUS | 0 refills | Status: DC
Start: 2022-09-07 — End: 2022-09-14

## 2022-09-07 MED ORDER — FREESTYLE LIBRE 3 SENSOR MISC: 1.0000 | 0 refills | Status: DC

## 2022-09-07 MED ORDER — DEXCOM G7 SENSOR MISC: 1.0000 | 0 refills | Status: DC

## 2022-09-07 MED ORDER — BLOOD GLUCOSE TEST VI STRP: 1.0000 | ORAL_STRIP | Freq: Three times a day (TID) | 3 refills | Status: DC

## 2022-09-07 MED ORDER — DEXCOM G7 RECEIVER DEVI: 1.0000 | 0 refills | Status: DC

## 2022-09-07 MED ORDER — BLOOD GLUCOSE MONITORING SUPPL DEVI: 1.0000 | Freq: Three times a day (TID) | 0 refills | Status: DC

## 2022-09-07 MED ORDER — LANCET DEVICE MISC: 1.0000 | Freq: Three times a day (TID) | 0 refills | Status: DC

## 2022-09-07 NOTE — Telephone Encounter (Signed)
Patient asking to advising which devise she needs to be using Freestyle Libre 3 or Dexcom 7 Please call patient

## 2022-09-07 NOTE — Progress Notes (Signed)
Outpatient Endocrinology Note Karen Coronado, MD  09/07/22   [name] 09/09/1965 [id number]  Referring Provider: [name], [name] Primary Care Provider: [name], [name] Reason for consultation: Subjective   Assessment & Plan  Diagnoses and all orders for this visit:  Uncontrolled type 2 diabetes mellitus with hyperglycemia, with long-term current use of insulin (HCC) -     POCT Glucose (CBG) -     insulin lispro (HUMALOG KWIKPEN) 100 UNIT/ML KwikPen; Inject up to 7 units before each meal -     insulin glargine, 2 Unit Dial, (TOUJEO MAX SOLOSTAR) 300 UNIT/ML Solostar Pen; 20 units in am  Long-term insulin use (HCC)  Long term (current) use of oral hypoglycemic drugs  Mixed hypercholesterolemia and hypertriglyceridemia  Other orders -     Blood Glucose Monitoring Suppl DEVI; 1 each by Does not apply route in the morning, at noon, and at bedtime. May substitute to any manufacturer covered by patient's insurance. -     Glucose Blood (BLOOD GLUCOSE TEST STRIPS) STRP; 1 each by In Vitro route in the morning, at noon, and at bedtime. May substitute to any manufacturer covered by patient's insurance. -     Lancet Device MISC; 1 each by Does not apply route in the morning, at noon, and at bedtime. May substitute to any manufacturer covered by patient's insurance. -     Lancets Misc. MISC; 1 each by Does not apply route in the morning, at noon, and at bedtime. May substitute to any manufacturer covered by patient's insurance. -     Continuous Glucose Sensor (DEXCOM G7 SENSOR) MISC; 1 Device by Does not apply route continuous. -     Continuous Glucose Receiver (DEXCOM G7 RECEIVER) DEVI; 1 Device by Does not apply route continuous.    Diabetes Type II complicated by CAD, stroke  Lab Results  Component Value Date   GFR 74.92 04/14/2022   Hba1c goal less than 7, current Hba1c is  Lab Results  Component Value Date   HGBA1C 9.7 (H) 07/20/2022   Will recommend the  following: INSULIN regimen currently: Toujeo 20 units daily in a.m.  Humalog: 4 to 6 units to cover the food, 4 units for small meals and 6 units for full meals Additionally take Humalog if blood sugars are more than 150 as follows 150-200: Take 4 units 200-250: Take 6 units Over 300: Take 10 units  Stopped Trulicity 4.5 mg weekly  Toujeo dose changed from 70 to 20 units recently during hospital visit and pt was stopped on Trulicity 4.5 mg GI weekly due to diarrhea and bloating  Put CGM sample and have pt back this week for dose adjustment   No known contraindications/side effects to any of above medications   -Last LD and Tg are as follows: Lab Results  Component Value Date   LDLCALC 67 06/02/2021    Lab Results  Component Value Date   TRIG 71 06/19/2021   -On rosuvastatin 10 mg every day and zetia 10 gm every day  -Follow low fat diet and exercise   -Blood pressure goal <140/90 - Microalbumin/creatinine goal is < 30 -Last MA/Cr is as follows: Lab Results  Component Value Date   MICROALBUR 3.6 (H) 04/16/2022   -not on ACE/ARB  -diet changes including salt restriction -limit eating outside -counseled BP targets per standards of diabetes care -uncontrolled blood pressure can lead to retinopathy, nephropathy and cardiovascular and atherosclerotic heart disease  Reviewed and counseled on: -A1C target -Blood sugar targets -  Complications of uncontrolled diabetes  -Checking blood sugar before meals and bedtime and bring log next visit -All medications with mechanism of action and side effects -Hypoglycemia management: rule of 15's, Glucagon Emergency Kit and medical alert ID -low-carb low-fat plate-method diet -At least 20 minutes of physical activity per day -Annual dilated retinal eye exam and foot exam -compliance and follow up needs -follow up as scheduled or earlier if problem gets worse  Call if blood sugar is less than 70 or consistently above 250    Take a  15 gm snack of carbohydrate at bedtime before you go to sleep if your blood sugar is less than 100.    If you are going to fast after midnight for a test or procedure, ask your physician for instructions on how to reduce/decrease your insulin dose.    Call if blood sugar is less than 70 or consistently above 250  -Treating a low sugar by rule of 15  (15 gms of sugar every 15 min until sugar is more than 70) If you feel your sugar is low, test your sugar to be sure If your sugar is low (less than 70), then take 15 grams of a fast acting Carbohydrate (3-4 glucose tablets or glucose gel or 4 ounces of juice or regular soda) Recheck your sugar 15 min after treating low to make sure it is more than 70 If sugar is still less than 70, treat again with 15 grams of carbohydrate          Don't drive the hour of hypoglycemia  If unconscious/unable to eat or drink by mouth, use glucagon injection or nasal spray baqsimi and call 911. Can repeat again in 15 min if still unconscious.  Return in about 4 days (around 09/11/2022) for at 8:40 pm.   I have reviewed current medications, nurse's notes, allergies, vital signs, past medical and surgical history, family medical history, and social history for this encounter. Counseled patient on symptoms, examination findings, lab findings, imaging results, treatment decisions and monitoring and prognosis. The patient understood the recommendations and agrees with the treatment plan. All questions regarding treatment plan were fully answered.  Karen [place], MD  09/07/22    History of Present Illness [name] is a 57 y.o. year old female who presents for evaluation of Type II diabetes mellitus.  [name] was first diagnosed in 1989.   Diabetes education +  Home diabetes regimen: INSULIN regimen currently: Toujeo 20 units daily in a.m.  Humalog: 4 to 6 units to cover the food, 4 units for small meals and 6 units for full  meals Additionally take Humalog if blood sugars are more than 150 as follows 150-200: Take 4 units 200-250: Take 6 units Over 300: Take 10 units  Stopped Trulicity 4.5 mg weekly  Background history:   She thinks she was initially treated with diet alone and subsequently given metformin With metformin she had nausea and diarrhea but she does not know if she was treated with any other medications until she was started on insulin in 2003.  Also was on glipizide in 2015 Most likely she was on Levemir or Lantus insulin for quite some time She was then switched to Toujeo in 2016 and although she was initially prescribed 90 units twice a day this was subsequently lowered  Her A1c has been occasionally between 7.2 and 7.5 but has been consistently over 9% since 2018  PREVIOUS OMNIPOD PUMP settings with Humalog U-200:  Basal settings: 12  AM = 2.6, 3 AM = 2.4, 9 AM = 2.7, 3 PM = 3.0 Sensitivity 1: 60 and target 140  Max bolus 6 units Active insulin time 6 hours  COMPLICATIONS +  MI/Stroke -  retinopathy -  neuropathy -  nephropathy   BLOOD SUGAR DATA Did not bring meter Per recall, 125-380 Checks 4 times a day  Physical Exam  BP 115/80   Pulse 75   Ht 5\' 6"  (1.676 m)   Wt 247 lb (112 kg)   LMP  (LMP Unknown)   SpO2 98%   BMI 39.87 kg/m    Constitutional: well developed, well nourished Head: normocephalic, atraumatic Eyes: sclera anicteric, no redness Neck: supple Lungs: normal respiratory effort Neurology: alert and oriented Skin: dry, no appreciable rashes Musculoskeletal: no appreciable defects Psychiatric: normal mood and affect Diabetic Foot Exam - Simple   No data filed      Current Medications Patient's Medications  New Prescriptions   BLOOD GLUCOSE MONITORING SUPPL DEVI    1 each by Does not apply route in the morning, at noon, and at bedtime. May substitute to any manufacturer covered by patient's insurance.   CONTINUOUS GLUCOSE RECEIVER (DEXCOM G7  RECEIVER) DEVI    1 Device by Does not apply route continuous.   CONTINUOUS GLUCOSE SENSOR (DEXCOM G7 SENSOR) MISC    1 Device by Does not apply route continuous.   GLUCOSE BLOOD (BLOOD GLUCOSE TEST STRIPS) STRP    1 each by In Vitro route in the morning, at noon, and at bedtime. May substitute to any manufacturer covered by patient's insurance.   LANCET DEVICE MISC    1 each by Does not apply route in the morning, at noon, and at bedtime. May substitute to any manufacturer covered by patient's insurance.   LANCETS MISC. MISC    1 each by Does not apply route in the morning, at noon, and at bedtime. May substitute to any manufacturer covered by patient's insurance.  Previous Medications   ALBUTEROL (ACCUNEB) 1.25 MG/3ML NEBULIZER SOLUTION    Take 1 ampule by nebulization 2 (two) times daily.   ALBUTEROL (VENTOLIN HFA) 108 (90 BASE) MCG/ACT INHALER    Inhale 1-2 puffs into the lungs every 6 (six) hours as needed.   AMITRIPTYLINE (ELAVIL) 25 MG TABLET    Take 2-3 tablets (50-75 mg total) by mouth at bedtime.   AMLODIPINE (NORVASC) 10 MG TABLET    Take 10 mg by mouth daily.   BLOOD GLUCOSE METER KIT AND SUPPLIES KIT    Dispense based on patient and insurance preference. Use up to four times daily as directed. (FOR E11.65 Z79.4).   BUTALBITAL-ACETAMINOPHEN-CAFFEINE (FIORICET) 50-325-40 MG TABLET    Take 1-2 tablets by mouth every 6 (six) hours as needed for headache.   CARVEDILOL (COREG) 25 MG TABLET    Take 25 mg by mouth 2 (two) times daily.   CHOLECALCIFEROL (VITAMIN D3) 25 MCG (1000 UNIT) TABLET    Take 1,000 Units by mouth daily.   CLOPIDOGREL (PLAVIX) 75 MG TABLET    Take 1 tablet (75 mg total) by mouth daily.   CYCLOSPORINE (RESTASIS) 0.05 % OPHTHALMIC EMULSION    Place 2 drops into both eyes 2 (two) times daily.   DULOXETINE (CYMBALTA) 60 MG CAPSULE    Take 1 capsule (60 mg total) by mouth daily.   EPINEPHRINE 0.3 MG/0.3 ML IJ SOAJ INJECTION    Inject 0.3 mg into the muscle as needed for  anaphylaxis.   EZETIMIBE (ZETIA) 10 MG  TABLET    Take 1 tablet (10 mg total) by mouth daily.   FUROSEMIDE (LASIX) 20 MG TABLET    Take 20 mg by mouth daily.   GABAPENTIN (NEURONTIN) 600 MG TABLET    600 mg qAM, 600 mg qPM, 1200 mg QHS   GLUCAGON (GVOKE HYPOPEN 2-PACK) 1 MG/0.2ML SOAJ    INJECT 1 MG INTO THE SKIN ONCE AS NEEDED FOR UP TO 1 DOSE (HYPOGLYCEMIA). ONLY FOR SEVERE LOW BLOOD SUGAR NOT RESPONDING TO DRINKING JUICE/GLUCOSE TABLETS, WHEN CBG IS <70   GLUCOSE BLOOD (BLOOD GLUCOSE TEST STRIPS) STRP    Use as directed to monitor blood sugar up to 4x a day FOR E11.65 Z79.4   HYDRALAZINE (APRESOLINE) 50 MG TABLET    Take 1 tablet by mouth 3 (three) times daily.   LANCETS MISC    Use as directed to monitor blood sugar up to 4x a day FOR E11.65 Z79.4   LEVETIRACETAM (KEPPRA) 500 MG TABLET    Take 500-750 mg by mouth See admin instructions. Take 1 tablet (500mg ) by mouth every morning and take 1 tablets by mouth (750mg ) by mouth every night   MECLIZINE (ANTIVERT) 25 MG TABLET    Take 1 tablet (25 mg total) by mouth 3 (three) times daily as needed for dizziness.   MIRABEGRON ER (MYRBETRIQ) 50 MG TB24 TABLET    Take 1 tablet (50 mg total) by mouth daily.   PANTOPRAZOLE (PROTONIX) 40 MG TABLET    Take 1 tablet (40 mg total) by mouth daily.   POLYETHYLENE GLYCOL (MIRALAX / GLYCOLAX) 17 G PACKET    Take 17 g by mouth daily as needed for mild constipation.   ROSUVASTATIN (CRESTOR) 10 MG TABLET    Take 1 tablet (10 mg total) by mouth daily.   SENNA-DOCUSATE (SENOKOT-S) 8.6-50 MG TABLET    Take 1 tablet by mouth at bedtime as needed for mild constipation.   TIZANIDINE (ZANAFLEX) 4 MG TABLET    Take 1 tablet (4 mg total) by mouth at bedtime as needed.   WIXELA INHUB 250-50 MCG/DOSE AEPB    Inhale 1 puff into the lungs 2 (two) times daily.  Modified Medications   Modified Medication Previous Medication   INSULIN GLARGINE, 2 UNIT DIAL, (TOUJEO MAX SOLOSTAR) 300 UNIT/ML SOLOSTAR PEN insulin glargine, 2 Unit  Dial, (TOUJEO MAX SOLOSTAR) 300 UNIT/ML Solostar Pen      20 units in am    20 units in am   INSULIN LISPRO (HUMALOG KWIKPEN) 100 UNIT/ML KWIKPEN insulin lispro (HUMALOG KWIKPEN) 100 UNIT/ML KwikPen      Inject up to 7 units before each meal    Inject up to 7 units before each meal  Discontinued Medications   No medications on file    Allergies Allergies  Allergen Reactions   Codeine Swelling and Other (See Comments)    Swelling and burning of mouth (inside)   [place] [Dapagliflozin] Other (See Comments)    Recurrent UTI with farxiga   Lactose Intolerance (Gi) Nausea And Vomiting    Past Medical History Past Medical History:  Diagnosis Date   Anxiety and depression 09/13/2006   Qualifier: Diagnosis of  By: Daphine Deutscher [name], [place]     Arthritis    joint pain    Asthma    COPD (chronic obstructive pulmonary disease) (HCC)    chronic bronchitis    Depression    Diabetes mellitus    since age 27; type 2 IDDM   Diabetic neuropathy, painful (HCC)  FEET AND HANDS   Diabetic retinopathy (HCC)    Diastolic dysfunction    a.  Echo 11/17: EF 60-65%, mild LVH, no RWMA, Gr1DD, mild AI, dilated aortic root measuring 38 mm, mildly dilated ascending aorta   Dilated aortic root (HCC)    38mm by echo 11/2015   Dizziness    secondary to diabetes and hypertension    Epilepsy idiopathic petit mal (HCC)    last seizure 2012;controlled w/ topomax   GERD (gastroesophageal reflux disease)    Glaucoma    NOT ON ANY EYE DROPS    Headache(784.0)    migraines    Heart murmur    born with    History of stress test    a. 11/17: Normal perfusion, EF 53%, normal study   Hx of blood clots    hematomas removed from left side of brain from 11mos to 57yrs old    Hyperlipidemia    Hypertension    Legally blind    left eye    MIGRAINE HEADACHE 09/14/2006   Qualifier: Diagnosis of  By: Daphine Deutscher [name], [place]ked[place]     Myocardial infarction [facility])    a.  Patient reported history of without objective  documentation   Nephrolithiasis    frequent urination , urination at nite  PT SEEN IN ER 09/22/11 FOR BACK AND RT SIDED PAIN--HAS KNOWN STONE RT URETER AND UA IN ER SHOWED UTI   Pain 09/23/2011   LOWER BACK AND RIGHT SIDE--PT HAS RIGHT URETERAL STONE   Pneumonia    hx of 2009   Pyelonephritis    SBO (small bowel obstruction) (HCC) 04/23/2021   Seizures (HCC)    Sleep apnea    [facility];does not use Cpap ; mild   Small bowel obstruction (HCC) 05/18/2021   Stress incontinence    Stroke (HCC)    last 2003  RESIDUAL LEFT LEG WEAKNESS--NO OTHER RESIDUAL PROBLEMS    Past Surgical History Past Surgical History:  Procedure Laterality Date   BRAIN HEMATOMA EVACUATION     five procedures total, first procedure when 67 months old, last at 57 years of age.   CYSTOSCOPY W/ RETROGRADES  09/24/2011   Procedure: CYSTOSCOPY WITH RETROGRADE PYELOGRAM;  Surgeon: Anner Crete, MD;  Location: [facility];  Service: Urology;  Laterality: Right;   CYSTOSCOPY WITH URETEROSCOPY  09/24/2011   Procedure: CYSTOSCOPY WITH URETEROSCOPY;  Surgeon: Anner Crete, MD;  Location: [facility];  Service: Urology;  Laterality: Right;  Balloon dilation right ureter    CYSTOSCOPY/RETROGRADE/URETEROSCOPY  08/24/2011   Procedure: CYSTOSCOPY/RETROGRADE/URETEROSCOPY;  Surgeon: Milford Cage, MD;  Location: [facility];  Service: Urology;  Laterality: Right;  Cysto, Right retrograde Pyelogram, right stent placement.    INCISIONAL HERNIA REPAIR  05/05/2011   Procedure: LAPAROSCOPIC INCISIONAL HERNIA;  Surgeon: Emelia Loron, MD;  Location: [facility];  Service: General;  Laterality: N/A;   kidney stone removal     LAPAROTOMY N/A 06/12/2021   Procedure: EXPLORATORY LAPAROTOMY, LYSIS OF ADHESIONS;  Surgeon: Henrene Dodge, MD;  Location: [facility];  Service: General;  Laterality: N/A;   MASS EXCISION  05/05/2011   Procedure: EXCISION MASS;  Surgeon: Emelia Loron, MD;  Location: [facility] OR;  Service: General;  Laterality: Right;    [facility]     RETINAL DETACHMENT SURGERY Left 1990   RIGHT/LEFT HEART CATH AND CORONARY ANGIOGRAPHY N/A 04/28/2019   Procedure: RIGHT/LEFT HEART CATH AND CORONARY ANGIOGRAPHY;  Surgeon: Alwyn Pea, MD;  Location: [facility];  Service:  Cardiovascular;  Laterality: N/A;   SHUNT REMOVAL     shunt inserted at age 71 removed at age 58    VAGINAL HYSTERECTOMY  55    Family History family history includes ADD / ADHD in her maternal grandfather; Birth defects in her daughter and maternal grandfather; Brain cancer in her maternal grandmother; Cancer in her mother; Cirrhosis in her maternal grandfather; Diabetes in her maternal grandfather; Hypertension in her daughter, maternal grandmother, and mother; Uterine cancer in her mother.  Social History Social History   Socioeconomic History   Marital status: Widowed    Spouse name: Not on file   Number of children: 2   Years of education: Not on file   Highest education level: 12th grade  Occupational History   Occupation: disabled  Tobacco Use   Smoking status: Former    Current packs/day: 0.00    Average packs/day: 1 pack/day for 15.0 years (15.0 ttl pk-yrs)    Types: Cigarettes    Start date: 01/20/1972    Quit date: 01/20/1987    Years since quitting: 35.6   Smokeless tobacco: Never   Tobacco comments:    quit more than 20 years  Vaping Use   Vaping status: Never Used  Substance and Sexual Activity   Alcohol use: Yes    Alcohol/week: 1.0 standard drink of alcohol    Types: 1 Glasses of wine per week   Drug use: No    Comment: hx of marijuana use no longer uses    Sexual activity: Yes    Partners: Male    Birth control/protection: Surgical  Other Topics Concern   Not on file  Social History Narrative   Pt lives with her daughter   Social Determinants of Health   Financial Resource Strain: Medium Risk (06/28/2022)   Overall Financial Resource Strain (CARDIA)    Difficulty of Paying Living Expenses: Somewhat hard   Food Insecurity: No Food Insecurity (08/13/2022)   Hunger Vital Sign    Worried About Running Out of Food in the Last Year: Never true    Ran Out of Food in the Last Year: Never true  Recent Concern: Food Insecurity - Food Insecurity Present (06/28/2022)   Hunger Vital Sign    Worried About Running Out of Food in the Last Year: Often true    Ran Out of Food in the Last Year: Often true  Transportation Needs: No Transportation Needs (08/13/2022)   PRAPARE - Administrator, Civil Service (Medical): No    Lack of Transportation (Non-Medical): No  Physical Activity: Insufficiently Active (06/28/2022)   Exercise Vital Sign    Days of Exercise per Week: 1 day    Minutes of Exercise per Session: 10 min  Stress: No Stress Concern Present (06/28/2022)   [employer] of Occupational Health - Occupational Stress Questionnaire    Feeling of Stress : Not at all  Social Connections: Moderately Integrated (06/28/2022)   Social Connection and Isolation Panel [NHANES]    Frequency of Communication with Friends and Family: Twice a week    Frequency of Social Gatherings with Friends and Family: More than three times a week    Attends Religious Services: 1 to 4 times per year    Active Member of [employer] or Organizations: Yes    Attends Banker Meetings: Never    Marital Status: Widowed  Intimate Partner Violence: Not At Risk (08/03/2022)   Humiliation, Afraid, Rape, and Kick questionnaire    Fear of Current or Ex-Partner:  No    Emotionally Abused: No    Physically Abused: No    Sexually Abused: No    Lab Results  Component Value Date   HGBA1C 9.7 (H) 07/20/2022   HGBA1C 9.8 (A) 04/16/2022   HGBA1C 12.0 (H) 01/23/2022   Lab Results  Component Value Date   CHOL 128 06/02/2021   Lab Results  Component Value Date   HDL 41 (L) 06/02/2021   Lab Results  Component Value Date   LDLCALC 67 06/02/2021   Lab Results  Component Value Date   TRIG 71 06/19/2021   Lab Results   Component Value Date   CHOLHDL 3.1 06/02/2021   Lab Results  Component Value Date   CREATININE 0.88 08/10/2022   Lab Results  Component Value Date   GFR 74.92 04/14/2022   Lab Results  Component Value Date   MICROALBUR 3.6 (H) 04/16/2022      Component Value Date/Time   NA 134 (L) 08/09/2022 0534   NA 138 05/08/2015 1106   K 4.6 08/09/2022 0534   CL 107 08/09/2022 0534   CO2 21 (L) 08/09/2022 0534   GLUCOSE 114 (H) 08/09/2022 0534   BUN 24 (H) 08/09/2022 0534   BUN 14 05/08/2015 1106   CREATININE 0.88 08/10/2022 0459   CREATININE 0.95 01/23/2022 1157   CALCIUM 8.6 (L) 08/09/2022 0534   PROT 7.7 08/03/2022 1406   PROT 7.7 05/08/2015 1106   ALBUMIN 3.8 08/03/2022 1406   ALBUMIN 4.1 05/08/2015 1106   AST 16 08/03/2022 1406   ALT 27 08/03/2022 1406   ALKPHOS 99 08/03/2022 1406   BILITOT 0.5 08/03/2022 1406   BILITOT 0.3 05/08/2015 1106   GFRNONAA >60 08/10/2022 0459   GFRNONAA 81 11/28/2018 0000   GFRAA >60 05/30/2019 1543   GFRAA 94 11/28/2018 0000      Latest Ref Rng & Units 08/10/2022    4:59 AM 08/09/2022    5:34 AM 08/08/2022    8:57 AM  BMP  Glucose 70 - 99 mg/dL  161  096   BUN 6 - 20 mg/dL  24  35   Creatinine 0.45 - 1.00 mg/dL 4.09  8.11  9.14   Sodium 135 - 145 mmol/L  134  136   Potassium 3.5 - 5.1 mmol/L  4.6  4.4   Chloride 98 - 111 mmol/L  107  105   CO2 22 - 32 mmol/L  21  25   Calcium 8.9 - 10.3 mg/dL  8.6  9.0        Component Value Date/Time   WBC 7.5 08/09/2022 0534   RBC 4.02 08/09/2022 0534   HGB 11.8 (L) 08/09/2022 0534   HGB 13.1 05/08/2015 1106   HCT 37.3 08/09/2022 0534   HCT 39.5 05/08/2015 1106   PLT 214 08/09/2022 0534   PLT 274 05/08/2015 1106   MCV 92.8 08/09/2022 0534   MCV 91 05/08/2015 1106   MCH 29.4 08/09/2022 0534   MCHC 31.6 08/09/2022 0534   RDW 14.0 08/09/2022 0534   RDW 14.1 05/08/2015 1106   LYMPHSABS 1.7 08/03/2022 1406   LYMPHSABS 1.7 05/08/2015 1106   MONOABS 0.5 08/03/2022 1406   EOSABS 0.0 08/03/2022  1406   EOSABS 0.0 05/08/2015 1106   BASOSABS 0.0 08/03/2022 1406   BASOSABS 0.0 05/08/2015 1106     Parts of this note may have been dictated using voice recognition software. There may be variances in spelling and vocabulary which are unintentional. Not all errors are proofread. Please notify  the [facility] Parkin if any discrepancies are noted or if the meaning of any statement is not clear.

## 2022-09-07 NOTE — Telephone Encounter (Signed)
Medication Samples have been provided to the patient.  Drug name: Freestyle Libre 3 Senso       Strength: 3        Qty: 1  LOT: [id number]  Exp.Date: 01/19/23  Dosing instructions: change sensor every 14 days   The patient has been instructed regarding the correct time, dose, and frequency of taking this medication, including desired effects and most common side effects.   [name] 10:56 AM 09/07/2022

## 2022-09-07 NOTE — Telephone Encounter (Signed)
Refills sent

## 2022-09-08 ENCOUNTER — Encounter: Payer: Self-pay | Admitting: Student in an Organized Health Care Education/Training Program

## 2022-09-08 ENCOUNTER — Ambulatory Visit
Payer: 59 | Attending: Student in an Organized Health Care Education/Training Program | Admitting: Student in an Organized Health Care Education/Training Program

## 2022-09-08 ENCOUNTER — Ambulatory Visit: Payer: Self-pay | Admitting: *Deleted

## 2022-09-08 VITALS — BP 139/81 | HR 73 | Temp 97.3°F | Resp 17 | Ht 66.0 in | Wt 247.0 lb

## 2022-09-08 DIAGNOSIS — M791 Myalgia, unspecified site: Secondary | ICD-10-CM | POA: Insufficient documentation

## 2022-09-08 DIAGNOSIS — G890 Central pain syndrome: Secondary | ICD-10-CM

## 2022-09-08 DIAGNOSIS — E1140 Type 2 diabetes mellitus with diabetic neuropathy, unspecified: Secondary | ICD-10-CM

## 2022-09-08 DIAGNOSIS — Z794 Long term (current) use of insulin: Secondary | ICD-10-CM | POA: Diagnosis not present

## 2022-09-08 DIAGNOSIS — E1142 Type 2 diabetes mellitus with diabetic polyneuropathy: Secondary | ICD-10-CM | POA: Diagnosis not present

## 2022-09-08 DIAGNOSIS — G43E01 Chronic migraine with aura, not intractable, with status migrainosus: Secondary | ICD-10-CM | POA: Diagnosis not present

## 2022-09-08 DIAGNOSIS — G894 Chronic pain syndrome: Secondary | ICD-10-CM

## 2022-09-08 MED ORDER — GABAPENTIN 600 MG PO TABS
ORAL_TABLET | ORAL | 5 refills | Status: DC
Start: 2022-09-08 — End: 2023-02-18

## 2022-09-08 MED ORDER — BUPRENORPHINE 7.5 MCG/HR TD PTWK: 1.0000 | MEDICATED_PATCH | TRANSDERMAL | 1 refills | Status: DC

## 2022-09-08 MED ORDER — UBRELVY 50 MG PO TABS
ORAL_TABLET | ORAL | 1 refills | Status: DC
Start: 2022-09-08 — End: 2022-10-27

## 2022-09-08 NOTE — Progress Notes (Signed)
Safety precautions to be maintained throughout the outpatient stay will include: orient to surroundings, keep bed in low position, maintain call bell within reach at all times, provide assistance with transfer out of bed and ambulation.  

## 2022-09-08 NOTE — Progress Notes (Signed)
PROVIDER NOTE: Information contained herein reflects review and annotations entered in association with encounter. Interpretation of such information and data should be left to medically-trained personnel. Information provided to patient can be located elsewhere in the medical record under "Patient Instructions". Document created using STT-dictation technology, any transcriptional errors that may result from process are unintentional.    Patient: [name]  Service Category: E/M  Provider: Edward Jolly, MD  DOB: 27-Dec-1965  DOS: 09/08/2022  Referring Provider: Sander Radon  MRN: [id number]  Specialty: Interventional Pain Management  PCP: [name], [name]  Type: Established Patient  Setting: Ambulatory outpatient    Location: Office  Delivery: Face-to-face     HPI  [name], a 57 y.o. year old female, is here today because of her Chronic pain syndrome [G89.4]. [name] primary complain today is Back Pain  Pertinent problems: [name] has Obesity, Class III, BMI 40-49.9 (morbid obesity) (HCC); Dyslipidemia associated with type 2 diabetes mellitus (HCC); Chronic pain syndrome; Diabetic polyneuropathy associated with type 2 diabetes mellitus (HCC); and CAD (coronary artery disease) on their pertinent problem list. Pain Assessment: Severity of Chronic pain is reported as a 8 /10. Location: Back Lower/down legs bilat to feet and toes. Onset: More than a month ago. Quality: Constant, Sharp, Shooting. Timing: Constant. Modifying factor(s): nothing, changing positions. Vitals:  height is 5\' 6"  (1.676 m) and weight is 247 lb (112 kg). Her temperature is 97.3 F (36.3 C) (abnormal). Her blood pressure is 139/81 and her pulse is 73. Her respiration is 17 and oxygen saturation is 100%.  BMI: Estimated body mass index is 39.87 kg/m as calculated from the following:   Height as of this encounter: 5\' 6"  (1.676 m).   Weight as of this encounter: 247 lb (112 kg). Last  encounter: 02/12/2022. Last procedure: 02/25/2022.  Reason for encounter:   [name] presents today with increased lower back pain as well as burning and tingling of bilateral feet.  Since her last visit with me, she was admitted to the hospital and end of July for acute cystitis and hyperglycemia.  Her last A1c was 9.7. She is on Plavix for history of multiple strokes.  She did find benefit with Qutenza treatment for painful diabetic neuropathy that was done in February 2024.  We discussed repeating that. She is on high-dose gabapentin which she does find benefit from.  She has tried Lyrica in the past and rotation to pregabalin in the future may be helpful. We discussed buprenorphine for chronic pain management.  She would like to trial the Butrans patch. She continues to struggle with migraines.  NSAIDs are contraindicated although she has tried them in the past and they were not helpful.  She has tried acetaminophen in the past which was not helpful.  She is also tried Imitrex in the past with limited response.  She was previously on Fioricet with limited response.  Pharmacotherapy Assessment  Analgesic: Butrans patch at 7.5 mcg an hour  Monitoring: [name] PMP: PDMP reviewed during this encounter.       Pharmacotherapy: No side-effects or adverse reactions reported. Compliance: No problems identified. Effectiveness: Clinically acceptable.  Nonah Mattes, RN  09/08/2022  1:04 PM  Sign when Signing Visit Safety precautions to be maintained throughout the outpatient stay will include: orient to surroundings, keep bed in low position, maintain call bell within reach at all times, provide assistance with transfer out of bed and ambulation.   No results found for: "CBDTHCR" No results found  for: "D8THCCBX" No results found for: "D9THCCBX"  UDS:  Summary  Date Value Ref Range Status  12/06/2018 Note  Final    Comment:     ==================================================================== Compliance Drug Analysis, Ur ==================================================================== Test                             Result       Flag       Units Drug Present and Declared for Prescription Verification   Oxycodone                      62           EXPECTED   ng/mg creat   Oxymorphone                    73           EXPECTED   ng/mg creat   Noroxycodone                   322          EXPECTED   ng/mg creat   Noroxymorphone                 43           EXPECTED   ng/mg creat    Sources of oxycodone are scheduled prescription medications.    Oxymorphone, noroxycodone, and noroxymorphone are expected    metabolites of oxycodone. Oxymorphone is also available as a    scheduled prescription medication.   Gabapentin                     PRESENT      EXPECTED   Levetiracetam                  PRESENT      EXPECTED   Baclofen                       PRESENT      EXPECTED   Acetaminophen                  PRESENT      EXPECTED Drug Present not Declared for Prescription Verification   Morphine                       227          UNEXPECTED ng/mg creat    Potential sources of morphine include administration of codeine or    morphine, use of heroin, or ingestion of poppy seeds. Drug Absent but Declared for Prescription Verification   Butalbital                     Not Detected UNEXPECTED   Duloxetine                     Not Detected UNEXPECTED   Prochlorperazine               Not Detected UNEXPECTED ==================================================================== Test                      Result    Flag   Units      Ref Range   Creatinine              157  mg/dL      >=16 ==================================================================== Declared Medications:  The flagging and interpretation on this report are based on the  following declared medications.  Unexpected results may arise from  inaccuracies  in the declared medications.  **Note: The testing scope of this panel includes these medications:  Baclofen  Butalbital (Fioricet)  Duloxetine (Cymbalta)  Gabapentin (Neurontin)  Levetiracetam (Keppra)  Oxycodone (Percocet)  Prochlorperazine (Compazine)  **Note: The testing scope of this panel does not include small to  moderate amounts of these reported medications:  Acetaminophen (Fioricet)  Acetaminophen (Percocet)  **Note: The testing scope of this panel does not include the  following reported medications:  Albuterol (Ventolin HFA)  Amlodipine  Caffeine (Fioricet)  Calcium  Cimetidine (Tagamet)  Clopidogrel (Plavix)  Cyclosporine (Restasis)  Dulaglutide  Ezetimibe (Zetia)  Fluorometholone  Fluticasone (Advair)  Indeterminate Medication  Insulin (NovoLog)  Losartan (Cozaar)  Mirabegron (Myrbetriq)  Ondansetron (Zofran)  Pantoprazole (Protonix)  Polyethylene Glycol (MiraLAX)  Potassium (Klor-Con)  Prednisone (Deltasone)  Salmeterol (Advair)  Simvastatin (Zocor)  Sucralfate (Carafate)  Sulfamethoxazole (Bactrim)  Terconazole  Trimethoprim (Bactrim)  Vitamin D ==================================================================== For clinical consultation, please call [phone]. ====================================================================       ROS  Constitutional:  Migraines Gastrointestinal: No reported hemesis, hematochezia, vomiting, or acute GI distress Musculoskeletal:  Low back pain Neurological:  Bilateral feet paresthesias  Medication Review  BLOOD GLUCOSE TEST STRIPS, Blood Glucose Monitoring Suppl, DULoxetine, Dexcom G7 Receiver, Dexcom G7 Sensor, EPINEPHrine, FreeStyle Libre 3 Sensor, Glucagon, Lancet Device, Lancets, Lancets Misc., Ubrogepant, albuterol, amLODipine, amitriptyline, blood glucose meter kit and supplies, buprenorphine, butalbital-acetaminophen-caffeine, carvedilol, cholecalciferol, clopidogrel, cycloSPORINE, ezetimibe,  fluticasone-salmeterol, furosemide, gabapentin, hydrALAZINE, insulin glargine (2 Unit Dial), insulin lispro, levETIRAcetam, meclizine, mirabegron ER, pantoprazole, polyethylene glycol, rosuvastatin, senna-docusate, and tiZANidine  History Review  Allergy: Ms. Hux is allergic to codeine, farxiga [dapagliflozin], and lactose intolerance (gi). Drug: Ms. Dumire  reports no history of drug use. Alcohol:  reports current alcohol use of about 1.0 standard drink of alcohol per week. Tobacco:  reports that she quit smoking about 35 years ago. Her smoking use included cigarettes. She started smoking about 50 years ago. She has a 15 pack-year smoking history. She has never used smokeless tobacco. Social: Ms. Bailor  reports that she quit smoking about 35 years ago. Her smoking use included cigarettes. She started smoking about 50 years ago. She has a 15 pack-year smoking history. She has never used smokeless tobacco. She reports current alcohol use of about 1.0 standard drink of alcohol per week. She reports that she does not use drugs. Medical:  has a past medical history of Anxiety and depression (09/13/2006), Arthritis, Asthma, COPD (chronic obstructive pulmonary disease) (HCC), Depression, Diabetes mellitus, Diabetic neuropathy, painful (HCC), Diabetic retinopathy (HCC), Diastolic dysfunction, Dilated aortic root (HCC), Dizziness, Epilepsy idiopathic petit mal (HCC), GERD (gastroesophageal reflux disease), Glaucoma, Headache(784.0), Heart murmur, History of stress test, blood clots, Hyperlipidemia, Hypertension, Legally blind, MIGRAINE HEADACHE (09/14/2006), Myocardial infarction [facility]), Nephrolithiasis, Pain (09/23/2011), Pneumonia, Pyelonephritis, SBO (small bowel obstruction) (HCC) (04/23/2021), Seizures (HCC), Sleep apnea, Small bowel obstruction (HCC) (05/18/2021), Stress incontinence, and Stroke (HCC). Surgical: Ms. Battistelli  has a past surgical history that includes kidney stone removal; Brain hematoma  evacuation; Shunt removal; Retinal detachment surgery (Left, 1990); Vaginal hysterectomy (1996); Incisional hernia repair (05/05/2011); Mass excision (05/05/2011); PCNL; Cystoscopy/retrograde/ureteroscopy (08/24/2011); Cystoscopy with ureteroscopy (09/24/2011); Cystoscopy w/ retrogrades (09/24/2011); RIGHT/LEFT HEART CATH AND CORONARY ANGIOGRAPHY (N/A, 04/28/2019); and laparotomy (N/A, 06/12/2021). Family: family history includes ADD / ADHD in her maternal grandfather; Birth defects  in her daughter and maternal grandfather; Brain cancer in her maternal grandmother; Cancer in her mother; Cirrhosis in her maternal grandfather; Diabetes in her maternal grandfather; Hypertension in her daughter, maternal grandmother, and mother; Uterine cancer in her mother.  Laboratory Chemistry Profile   Renal Lab Results  Component Value Date   BUN 24 (H) 08/09/2022   CREATININE 0.88 08/10/2022   LABCREA 25 11/17/2021   BCR SEE NOTE: 01/23/2022   GFR 74.92 04/14/2022   GFRAA >60 05/30/2019   GFRNONAA >60 08/10/2022    Hepatic Lab Results  Component Value Date   AST 16 08/03/2022   ALT 27 08/03/2022   ALBUMIN 3.8 08/03/2022   ALKPHOS 99 08/03/2022   LIPASE 37 07/07/2021    Electrolytes Lab Results  Component Value Date   NA 134 (L) 08/09/2022   K 4.6 08/09/2022   CL 107 08/09/2022   CALCIUM 8.6 (L) 08/09/2022   MG 2.1 10/18/2021   PHOS 4.7 (H) 07/07/2021    Bone Lab Results  Component Value Date   VD25OH 33 11/17/2021    Inflammation (CRP: Acute Phase) (ESR: Chronic Phase) Lab Results  Component Value Date   CRP 16.7 (H) 06/14/2021   ESRSEDRATE 65 (H) 06/14/2021   LATICACIDVEN 1.6 08/03/2022         Note: Above Lab results reviewed.  Recent Imaging Review  CT ABDOMEN PELVIS W CONTRAST CLINICAL DATA:  Concern for bowel obstruction.  EXAM: CT ABDOMEN AND PELVIS WITH CONTRAST  TECHNIQUE: Multidetector CT imaging of the abdomen and pelvis was performed using the standard protocol following  bolus administration of intravenous contrast.  RADIATION DOSE REDUCTION: This exam was performed according to the departmental dose-optimization program which includes automated exposure control, adjustment of the mA and/or kV according to patient size and/or use of iterative reconstruction technique.  CONTRAST:  OMNIPAQUE IOHEXOL 350 MG/ML SOLN  COMPARISON:  CT abdomen pelvis dated 10/15/2021.  FINDINGS: Lower chest: Trace bilateral pleural effusions with minimal bibasilar subpleural atelectasis.  No intra-abdominal free air or free fluid.  Hepatobiliary: The liver is unremarkable. No biliary dilatation. The gallbladder is unremarkable.  Pancreas: Unremarkable. No pancreatic ductal dilatation or surrounding inflammatory changes.  Spleen: Normal in size without focal abnormality.  Adrenals/Urinary Tract: The left adrenal gland is unremarkable. Stable 2.5 cm right adrenal adenoma. Small nonobstructing bilateral renal calculi measure up to 6 mm in the inferior pole of the right kidney. There is no hydronephrosis on either side. There is symmetric enhancement and excretion of contrast by both kidneys. The visualized ureters and urinary bladder appear unremarkable.  Stomach/Bowel: There is sigmoid diverticulosis without active inflammatory changes. Multiple mildly dilated loops of proximal small bowel measure up to 4.5 cm no discrete transition noted. Findings favored to represent an ileus. Developing obstruction is not excluded. Small-bowel series may provide better evaluation. The appendix is normal.  Vascular/Lymphatic: The abdominal aorta and IVC are unremarkable. No portal venous gas. There is no adenopathy.  Reproductive: Hysterectomy.  No adnexal masses.  Other: Midline vertical anterior pelvic wall incisional scar. Small fat containing umbilical hernia.  Musculoskeletal: Degenerative changes of the spine. No acute osseous pathology.  IMPRESSION: 1. Mildly  dilated proximal small bowel favored to represent an ileus. Developing obstruction is not excluded. Small-bowel series may provide better evaluation. 2. Sigmoid diverticulosis. 3. Nonobstructing bilateral renal calculi. No hydronephrosis.  Electronically Signed   By: Elgie Collard M.D.   On: 08/08/2022 21:55 DG Abd 1 View CLINICAL DATA:  Abdominal pain and constipation, initial encounter  EXAM: ABDOMEN - 1 VIEW  COMPARISON:  10/18/2021  FINDINGS: Scattered large and small bowel gas is noted. A few mildly dilated loops of small bowel are noted in the left mid abdomen. This may represent a partial small bowel obstruction or ileus. CT may be helpful for further evaluation. No free air is noted. No bony abnormality is seen.  IMPRESSION: Mildly dilated loops of small bowel on the left suspicious for ileus or early small bowel obstruction. CT may be helpful as clinically indicated.  Electronically Signed   By: Alcide Clever M.D.   On: 08/08/2022 20:24 Note: Reviewed        Physical Exam  General appearance: Well nourished, well developed, and well hydrated. In no apparent acute distress Mental status: Alert, oriented x 3 (person, place, & time)       Respiratory: No evidence of acute respiratory distress Eyes: PERLA Vitals: BP 139/81   Pulse 73   Temp (!) 97.3 F (36.3 C)   Resp 17   Ht 5\' 6"  (1.676 m)   Wt 247 lb (112 kg)   LMP  (LMP Unknown)   SpO2 100%   BMI 39.87 kg/m  BMI: Estimated body mass index is 39.87 kg/m as calculated from the following:   Height as of this encounter: 5\' 6"  (1.676 m).   Weight as of this encounter: 247 lb (112 kg). Ideal: Ideal body weight: 59.3 kg (130 lb 11.7 oz) Adjusted ideal body weight: 80.4 kg (177 lb 3.8 oz)  Assessment   Diagnosis  1. Chronic pain syndrome   2. Diabetic polyneuropathy associated with type 2 diabetes mellitus (HCC)   3. Chronic painful diabetic neuropathy (HCC)   4. Central pain syndrome   5. Myalgia    6. Chronic migraine with aura and with status migrainosus, not intractable        Plan of Care  1. Chronic pain syndrome - gabapentin (NEURONTIN) 600 MG tablet; 600 mg qAM, 600 mg qPM, 1200 mg QHS  Dispense: 120 tablet; Refill: 5  2. Diabetic polyneuropathy associated with type 2 diabetes mellitus (HCC) - gabapentin (NEURONTIN) 600 MG tablet; 600 mg qAM, 600 mg qPM, 1200 mg QHS  Dispense: 120 tablet; Refill: 5  3. Chronic painful diabetic neuropathy (HCC) - NEUROLYSIS; Future  4. Central pain syndrome - gabapentin (NEURONTIN) 600 MG tablet; 600 mg qAM, 600 mg qPM, 1200 mg QHS  Dispense: 120 tablet; Refill: 5  5. Myalgia  6. Chronic migraine with aura and with status migrainosus, not intractable - Ubrogepant (UBRELVY) 50 MG TABS; 50 mg or 100 mg taken orally with or without food. If needed, a second dose may be taken at least 2 hours after the initial dose. The maximum dose in a 24-hour period is 200 mg  Dispense: 60 tablet; Refill: 1    [name] has a current medication list which includes the following long-term medication(s): amitriptyline, duloxetine, ezetimibe, toujeo max solostar, insulin lispro, levetiracetam, mirabegron er, pantoprazole, rosuvastatin, wixela inhub, and gabapentin.  Pharmacotherapy (Medications Ordered): Meds ordered this encounter  Medications   buprenorphine (BUTRANS) 7.5 MCG/HR    Sig: Place 1 patch onto the skin once a week.    Dispense:  4 patch    Refill:  1    Chronic Pain: STOP Act (Not applicable) Fill 1 day early if closed on refill date. Avoid benzodiazepines within 8 hours of opioids   gabapentin (NEURONTIN) 600 MG tablet    Sig: 600 mg qAM, 600 mg qPM, 1200 mg  QHS    Dispense:  120 tablet    Refill:  5   Ubrogepant (UBRELVY) 50 MG TABS    Sig: 50 mg or 100 mg taken orally with or without food. If needed, a second dose may be taken at least 2 hours after the initial dose. The maximum dose in a 24-hour period is 200 mg     Dispense:  60 tablet    Refill:  1   Orders:  Orders Placed This Encounter  Procedures   NEUROLYSIS    Please order Qutenza patches from pharmacy    Standing Status:   Future    Standing Expiration Date:   12/09/2022    Order Specific Question:   Where will this procedure be performed?    Answer:   [facility]   Follow-up plan:   Return in about 8 weeks (around 11/03/2022) for Medication Management, in person.      Recent Visits No visits were found meeting these conditions. Showing recent visits within past 90 days and meeting all other requirements Today's Visits Date Type Provider Dept  09/08/22 Office Visit Edward Jolly, MD [facility]  Showing today's visits and meeting all other requirements Future Appointments No visits were found meeting these conditions. Showing future appointments within next 90 days and meeting all other requirements  I discussed the assessment and treatment plan with the patient. The patient was provided an opportunity to ask questions and all were answered. The patient agreed with the plan and demonstrated an understanding of the instructions.  Patient advised to call back or seek an in-person evaluation if the symptoms or condition worsens.  Duration of encounter: .  Total time on encounter, as per AMA guidelines included both the face-to-face and non-face-to-face time personally spent by the physician and/or other qualified health care professional(s) on the day of the encounter (includes time in activities that require the physician or other qualified health care professional and does not include time in activities normally performed by clinical staff). Physician's time may include the following activities when performed: Preparing to see the patient (e.g., pre-charting review of records, searching for previously ordered imaging, lab work, and nerve conduction tests) Review of prior analgesic  pharmacotherapies. Reviewing PMP Interpreting ordered tests (e.g., lab work, imaging, nerve conduction tests) Performing post-procedure evaluations, including interpretation of diagnostic procedures Obtaining and/or reviewing separately obtained history Performing a medically appropriate examination and/or evaluation Counseling and educating the patient/family/caregiver Ordering medications, tests, or procedures Referring and communicating with other health care professionals (when not separately reported) Documenting clinical information in the electronic or other health record Independently interpreting results (not separately reported) and communicating results to the patient/ family/caregiver Care coordination (not separately reported)  Note by: Edward Jolly, MD Date: 09/08/2022; Time: 1:46 PM

## 2022-09-08 NOTE — Patient Outreach (Signed)
  Care Coordination   Follow Up Visit Note   09/08/2022 Name: [name] MRN: [id number] DOB: Apr 09, 1965  [name] is a 57 y.o. year old female who sees [name], [place] for primary care. I spoke with  [name] by phone today.  What matters to the patients health and wellness today?  Get xray to determine cause of hip pain    Goals Addressed             This Visit's Progress    Management of chronic medical conditions (DM, HTN, CHF)   On track      Interventions Today    Flowsheet Row Most Recent Value  Chronic Disease   Chronic disease during today's visit Hypertension (HTN), Diabetes, Other  [hip pain]  General Interventions   General Interventions Discussed/Reviewed General Interventions Reviewed, Doctor Visits, Durable Medical Equipment (DME)  Eather Colas of need for x-ray to assess hip pain, will secure transportation and have this done]  Doctor Visits Discussed/Reviewed Doctor Visits Reviewed, Specialist  [Endocrinology 8/23, PFT & Pulmonary 8/27]  Durable Medical Equipment (DME) Glucomoter  [now has Freestyle libre 3]  PCP/Specialist Visits Compliance with follow-up visit  Education Interventions   Education Provided Provided Education  Provided Verbal Education On Blood Sugar Monitoring, Medication, When to see the doctor, Other  [Report BP is "good" and glucose today 150]              SDOH assessments and interventions completed:  No     Care Coordination Interventions:  Yes, provided   Follow up plan: Follow up call scheduled for 9/24    Encounter Outcome:  Pt. Visit Completed   Kemper Durie, RN, MSN, [facility] Care Management Care Management Coordinator [phone]

## 2022-09-09 ENCOUNTER — Other Ambulatory Visit: Payer: Self-pay | Admitting: Family Medicine

## 2022-09-09 ENCOUNTER — Other Ambulatory Visit: Payer: Self-pay | Admitting: Student in an Organized Health Care Education/Training Program

## 2022-09-09 ENCOUNTER — Other Ambulatory Visit: Payer: Self-pay | Admitting: "Endocrinology

## 2022-09-09 ENCOUNTER — Other Ambulatory Visit: Payer: Self-pay | Admitting: Internal Medicine

## 2022-09-09 DIAGNOSIS — I519 Heart disease, unspecified: Secondary | ICD-10-CM | POA: Diagnosis not present

## 2022-09-09 DIAGNOSIS — E1165 Type 2 diabetes mellitus with hyperglycemia: Secondary | ICD-10-CM | POA: Diagnosis not present

## 2022-09-09 DIAGNOSIS — G890 Central pain syndrome: Secondary | ICD-10-CM

## 2022-09-09 DIAGNOSIS — F330 Major depressive disorder, recurrent, mild: Secondary | ICD-10-CM

## 2022-09-09 DIAGNOSIS — J449 Chronic obstructive pulmonary disease, unspecified: Secondary | ICD-10-CM | POA: Diagnosis not present

## 2022-09-09 DIAGNOSIS — E119 Type 2 diabetes mellitus without complications: Secondary | ICD-10-CM

## 2022-09-09 DIAGNOSIS — G894 Chronic pain syndrome: Secondary | ICD-10-CM

## 2022-09-09 DIAGNOSIS — N390 Urinary tract infection, site not specified: Secondary | ICD-10-CM | POA: Diagnosis not present

## 2022-09-09 DIAGNOSIS — I119 Hypertensive heart disease without heart failure: Secondary | ICD-10-CM | POA: Diagnosis not present

## 2022-09-09 DIAGNOSIS — I252 Old myocardial infarction: Secondary | ICD-10-CM

## 2022-09-09 DIAGNOSIS — E1169 Type 2 diabetes mellitus with other specified complication: Secondary | ICD-10-CM

## 2022-09-09 DIAGNOSIS — E1142 Type 2 diabetes mellitus with diabetic polyneuropathy: Secondary | ICD-10-CM

## 2022-09-09 DIAGNOSIS — E1140 Type 2 diabetes mellitus with diabetic neuropathy, unspecified: Secondary | ICD-10-CM | POA: Diagnosis not present

## 2022-09-09 DIAGNOSIS — I69354 Hemiplegia and hemiparesis following cerebral infarction affecting left non-dominant side: Secondary | ICD-10-CM | POA: Diagnosis not present

## 2022-09-09 DIAGNOSIS — Z794 Long term (current) use of insulin: Secondary | ICD-10-CM | POA: Diagnosis not present

## 2022-09-09 DIAGNOSIS — M25551 Pain in right hip: Secondary | ICD-10-CM

## 2022-09-09 DIAGNOSIS — H5442A5 Blindness left eye category 5, normal vision right eye: Secondary | ICD-10-CM | POA: Diagnosis not present

## 2022-09-09 NOTE — Telephone Encounter (Signed)
Called and advised pt that she will need to keep using the libra , pt said that is what her insurance will pay for. She will follow up at her appt.

## 2022-09-10 DIAGNOSIS — I519 Heart disease, unspecified: Secondary | ICD-10-CM | POA: Diagnosis not present

## 2022-09-10 DIAGNOSIS — H5442A5 Blindness left eye category 5, normal vision right eye: Secondary | ICD-10-CM | POA: Diagnosis not present

## 2022-09-10 DIAGNOSIS — N390 Urinary tract infection, site not specified: Secondary | ICD-10-CM | POA: Diagnosis not present

## 2022-09-10 DIAGNOSIS — E1165 Type 2 diabetes mellitus with hyperglycemia: Secondary | ICD-10-CM | POA: Diagnosis not present

## 2022-09-10 DIAGNOSIS — J449 Chronic obstructive pulmonary disease, unspecified: Secondary | ICD-10-CM | POA: Diagnosis not present

## 2022-09-10 DIAGNOSIS — Z794 Long term (current) use of insulin: Secondary | ICD-10-CM | POA: Diagnosis not present

## 2022-09-10 DIAGNOSIS — E1140 Type 2 diabetes mellitus with diabetic neuropathy, unspecified: Secondary | ICD-10-CM | POA: Diagnosis not present

## 2022-09-10 DIAGNOSIS — I119 Hypertensive heart disease without heart failure: Secondary | ICD-10-CM | POA: Diagnosis not present

## 2022-09-10 DIAGNOSIS — I69354 Hemiplegia and hemiparesis following cerebral infarction affecting left non-dominant side: Secondary | ICD-10-CM | POA: Diagnosis not present

## 2022-09-10 MED ORDER — LANCET DEVICE MISC: 1.0000 | Freq: Three times a day (TID) | 0 refills | Status: DC

## 2022-09-10 MED ORDER — ALBUTEROL SULFATE 1.25 MG/3ML IN NEBU: 1.0000 | INHALATION_SOLUTION | Freq: Two times a day (BID) | RESPIRATORY_TRACT | 0 refills | Status: DC

## 2022-09-10 MED ORDER — FUROSEMIDE 20 MG PO TABS: 20.0000 mg | ORAL_TABLET | Freq: Every day | ORAL | 0 refills | Status: DC

## 2022-09-10 MED ORDER — ALBUTEROL SULFATE HFA 108 (90 BASE) MCG/ACT IN AERS: 1.0000 | INHALATION_SPRAY | Freq: Four times a day (QID) | RESPIRATORY_TRACT | 0 refills | Status: DC | PRN

## 2022-09-10 MED ORDER — BLOOD GLUCOSE MONITORING SUPPL DEVI: 1.0000 | Freq: Three times a day (TID) | 0 refills | Status: DC

## 2022-09-10 MED ORDER — AMLODIPINE BESYLATE 10 MG PO TABS: 10.0000 mg | ORAL_TABLET | Freq: Every day | ORAL | 0 refills | Status: DC

## 2022-09-10 MED ORDER — EPINEPHRINE 0.3 MG/0.3ML IJ SOAJ: 0.3000 mg | INTRAMUSCULAR | 0 refills | Status: DC | PRN

## 2022-09-10 MED ORDER — BLOOD GLUCOSE MONITOR KIT
PACK | 0 refills | Status: DC
Start: 2022-09-10 — End: 2022-10-05

## 2022-09-10 NOTE — Telephone Encounter (Signed)
Per patient she said that we do not prescribe any of these RX other providers outside of our office does. Per [place] send her this message and attach Della Goo in it and was asked to put it needs to be marked as denied/ other provider.

## 2022-09-11 ENCOUNTER — Encounter: Payer: Self-pay | Admitting: "Endocrinology

## 2022-09-11 ENCOUNTER — Encounter: Payer: Self-pay | Admitting: Student in an Organized Health Care Education/Training Program

## 2022-09-11 ENCOUNTER — Ambulatory Visit (INDEPENDENT_AMBULATORY_CARE_PROVIDER_SITE_OTHER): Payer: 59 | Admitting: "Endocrinology

## 2022-09-11 VITALS — BP 120/65 | HR 61 | Ht 66.0 in | Wt 243.6 lb

## 2022-09-11 DIAGNOSIS — E7800 Pure hypercholesterolemia, unspecified: Secondary | ICD-10-CM

## 2022-09-11 DIAGNOSIS — I519 Heart disease, unspecified: Secondary | ICD-10-CM | POA: Diagnosis not present

## 2022-09-11 DIAGNOSIS — E1140 Type 2 diabetes mellitus with diabetic neuropathy, unspecified: Secondary | ICD-10-CM | POA: Diagnosis not present

## 2022-09-11 DIAGNOSIS — H5442A5 Blindness left eye category 5, normal vision right eye: Secondary | ICD-10-CM | POA: Diagnosis not present

## 2022-09-11 DIAGNOSIS — Z794 Long term (current) use of insulin: Secondary | ICD-10-CM

## 2022-09-11 DIAGNOSIS — I69354 Hemiplegia and hemiparesis following cerebral infarction affecting left non-dominant side: Secondary | ICD-10-CM | POA: Diagnosis not present

## 2022-09-11 DIAGNOSIS — E1165 Type 2 diabetes mellitus with hyperglycemia: Secondary | ICD-10-CM | POA: Diagnosis not present

## 2022-09-11 DIAGNOSIS — N390 Urinary tract infection, site not specified: Secondary | ICD-10-CM | POA: Diagnosis not present

## 2022-09-11 DIAGNOSIS — I119 Hypertensive heart disease without heart failure: Secondary | ICD-10-CM | POA: Diagnosis not present

## 2022-09-11 DIAGNOSIS — J449 Chronic obstructive pulmonary disease, unspecified: Secondary | ICD-10-CM | POA: Diagnosis not present

## 2022-09-11 MED ORDER — ROSUVASTATIN CALCIUM 40 MG PO TABS
40.0000 mg | ORAL_TABLET | Freq: Every day | ORAL | 1 refills | Status: DC
Start: 2022-09-11 — End: 2022-11-19

## 2022-09-11 NOTE — Patient Instructions (Signed)
Toujeo 22 units daily in a.m. Humalog: 8 units for breakfast, 9 units for lunch and 8 units for dinner, 15 min before meals   _____________   Goals of DM therapy:  Morning Fasting blood sugar: 80-140  Blood sugar before meals: 80-140 Bed time blood sugar: 100-150  A1C <7%, limited only by hypoglycemia  1.Diabetes medications and their side effects discussed, including hypoglycemia    2. Check blood glucose:  a) Always check blood sugars before driving. Please see below (under hypoglycemia) on how to manage b) Check a minimum of 3 times/day or more as needed when having symptoms of hypoglycemia.   c) Try to check blood glucose before sleeping/in the middle of the night to ensure that it is remaining stable and not dropping less than 100 d) Check blood glucose more often if sick  3. Diet: a) 3 meals per day schedule b: Restrict carbs to 60-70 grams (4 servings) per meal c) Colorful vegetables - 3 servings a day, and low sugar fruit 2 servings/day Plate control method: 1/4 plate protein, 1/4 starch, 1/2 green, yellow, or red vegetables d) Avoid carbohydrate snacks unless hypoglycemic episode, or increased physical activity  4. Regular exercise as tolerated, preferably 3 or more hours a week  5. Hypoglycemia: a)  Do not drive or operate machinery without first testing blood glucose to assure it is over 90 mg%, or if dizzy, lightheaded, not feeling normal, etc, or  if foot or leg is numb or weak. b)  If blood glucose less than 70, take four 5gm Glucose tabs or 15-30 gm Glucose gel.  Repeat every 15 min as needed until blood sugar is >100 mg/dl. If hypoglycemia persists then call 911.   6. Sick day management: a) Check blood glucose more often b) Continue usual therapy if blood sugars are elevated.   7. Contact the doctor immediately if blood glucose is frequently <60 mg/dl, or an episode of severe hypoglycemia occurs (where someone had to give you glucose/  glucagon or if you  passed out from a low blood glucose), or if blood glucose is persistently >350 mg/dl, for further management  8. A change in level of physical activity or exercise and a change in diet may also affect your blood sugar. Check blood sugars more often and call if needed.  Instructions: 1. Bring glucose meter, blood glucose records on every visit for review 2. Continue to follow up with primary care physician and other providers for medical care 3. Yearly eye  and foot exam 4. Please get blood work done prior to the next appointment

## 2022-09-11 NOTE — Progress Notes (Signed)
Outpatient Endocrinology Note Karen Roland, MD  09/11/22   [name] [birth year] [id number]  Referring Provider: [name], [name] Primary Care Provider: [name], [name] Reason for consultation: Subjective   Assessment & Plan  Diagnoses and all orders for this visit:  Uncontrolled type 2 diabetes mellitus with hyperglycemia, with long-term current use of insulin (HCC) -     rosuvastatin (CRESTOR) 40 MG tablet; Take 1 tablet (40 mg total) by mouth daily.  Long-term insulin use (HCC)  Pure hypercholesterolemia     Diabetes Type II complicated by CAD, stroke  Lab Results  Component Value Date   GFR 74.92 04/14/2022   Hba1c goal less than 7, current Hba1c is  Lab Results  Component Value Date   HGBA1C 9.7 (H) 07/20/2022   Will recommend the following: Toujeo 22 units daily in a.m. Humalog: 8 units for breakfast, 9 units for lunch and 8 units for dinner, 15 min before meals  Libre 3 is covered, Dexcom not covered  Stopped Trulicity 4.5 mg weekly  Toujeo dose changed from 70 to 20 units recently during hospital visit and pt was stopped on Trulicity 4.5 mg GI weekly due to diarrhea and bloating   No known contraindications/side effects to any of above medications  -Last LD and Tg are as follows: Lab Results  Component Value Date   LDLCALC 67 06/02/2021    Lab Results  Component Value Date   TRIG 71 06/19/2021   -On rosuvastatin 10 mg every day,  zetia 10 mg every day  -start rosuvastatin 40 mg every day on 09/11/22, (if tolerated, patient will stop zetia 10 mg every day)  -Follow low fat diet and exercise   -Blood pressure goal <140/90 - Microalbumin/creatinine goal is < 30 -Last MA/Cr is as follows: Lab Results  Component Value Date   MICROALBUR 3.6 (H) 04/16/2022   -not on ACE/ARB  -diet changes including salt restriction -limit eating outside -counseled BP targets per standards of diabetes care -uncontrolled blood pressure can  lead to retinopathy, nephropathy and cardiovascular and atherosclerotic heart disease  Reviewed and counseled on: -A1C target -Blood sugar targets -Complications of uncontrolled diabetes  -Checking blood sugar before meals and bedtime and bring log next visit -All medications with mechanism of action and side effects -Hypoglycemia management: rule of 15's, Glucagon Emergency Kit and medical alert ID -low-carb low-fat plate-method diet -At least 20 minutes of physical activity per day -Annual dilated retinal eye exam and foot exam -compliance and follow up needs -follow up as scheduled or earlier if problem gets worse  Call if blood sugar is less than 70 or consistently above 250    Take a 15 gm snack of carbohydrate at bedtime before you go to sleep if your blood sugar is less than 100.    If you are going to fast after midnight for a test or procedure, ask your physician for instructions on how to reduce/decrease your insulin dose.    Call if blood sugar is less than 70 or consistently above 250  -Treating a low sugar by rule of 15  (15 gms of sugar every 15 min until sugar is more than 70) If you feel your sugar is low, test your sugar to be sure If your sugar is low (less than 70), then take 15 grams of a fast acting Carbohydrate (3-4 glucose tablets or glucose gel or 4 ounces of juice or regular soda) Recheck your sugar 15 min after treating low to make sure it  is more than 70 If sugar is still less than 70, treat again with 15 grams of carbohydrate          Don't drive the hour of hypoglycemia  If unconscious/unable to eat or drink by mouth, use glucagon injection or nasal spray baqsimi and call 911. Can repeat again in 15 min if still unconscious.  Return in about 7 weeks (around 10/27/2022).   I have reviewed current medications, nurse's notes, allergies, vital signs, past medical and surgical history, family medical history, and social history for this encounter. Counseled  patient on symptoms, examination findings, lab findings, imaging results, treatment decisions and monitoring and prognosis. The patient understood the recommendations and agrees with the treatment plan. All questions regarding treatment plan were fully answered.  Karen Edcouch, MD  09/11/22    History of Present Illness [name] is a 57 y.o. year old female who presents for follow up of Type II diabetes mellitus.  [name] was first diagnosed in 1989.   Diabetes education +  Home diabetes regimen: Toujeo 20 units daily in a.m. Humalog: 7 units 15 min before meals   Stopped Trulicity 4.5 mg weekly  Background history:   She thinks she was initially treated with diet alone and subsequently given metformin With metformin she had nausea and diarrhea but she does not know if she was treated with any other medications until she was started on insulin in 2003.  Also was on glipizide in 2015 Most likely she was on Levemir or Lantus insulin for quite some time She was then switched to Toujeo in 2016 and although she was initially prescribed 90 units twice a day this was subsequently lowered  Her A1c has been occasionally between 7.2 and 7.5 but has been consistently over 9% since 2018  PREVIOUS OMNIPOD PUMP settings with Humalog U-200:  Basal settings: 12 AM = 2.6, 3 AM = 2.4, 9 AM = 2.7, 3 PM = 3.0 Sensitivity 1: 60 and target 140  Max bolus 6 units Active insulin time 6 hours  COMPLICATIONS +  MI/Stroke -  retinopathy -  neuropathy -  nephropathy  BLOOD SUGAR DATA        CGM interpretation: At today's visit, we reviewed her CGM downloads. The full report is scanned in the media. Reviewing the CGM trends, BG are elevated across the day,  mainly around lunch and dinner, has one time low at night (slept early, missed night snack)   Physical Exam  BP 120/65   Pulse 61   Ht 5\' 6"  (1.676 m)   Wt 243 lb 9.6 oz (110.5 kg)   LMP  (LMP Unknown)   SpO2  97%   BMI 39.32 kg/m    Constitutional: well developed, well nourished Head: normocephalic, atraumatic Eyes: sclera anicteric, no redness Neck: supple Lungs: normal respiratory effort Neurology: alert and oriented Skin: dry, no appreciable rashes Musculoskeletal: no appreciable defects Psychiatric: normal mood and affect Diabetic Foot Exam - Simple   No data filed      Current Medications Patient's Medications  New Prescriptions   No medications on file  Previous Medications   ALBUTEROL (ACCUNEB) 1.25 MG/3ML NEBULIZER SOLUTION    Take 3 mLs (1.25 mg total) by nebulization 2 (two) times daily.   ALBUTEROL (VENTOLIN HFA) 108 (90 BASE) MCG/ACT INHALER    Inhale 1-2 puffs into the lungs every 6 (six) hours as needed.   AMITRIPTYLINE (ELAVIL) 25 MG TABLET    Take 2-3 tablets (50-75 mg total)  by mouth at bedtime.   AMLODIPINE (NORVASC) 10 MG TABLET    Take 1 tablet (10 mg total) by mouth daily.   BLOOD GLUCOSE METER KIT AND SUPPLIES KIT    Dispense based on patient and insurance preference. Use up to four times daily as directed. (FOR E11.65 Z79.4).   BLOOD GLUCOSE MONITORING SUPPL DEVI    1 each by Does not apply route in the morning, at noon, and at bedtime. May substitute to any manufacturer covered by patient's insurance.   BUPRENORPHINE (BUTRANS) 7.5 MCG/HR    Place 1 patch onto the skin once a week.   BUTALBITAL-ACETAMINOPHEN-CAFFEINE (FIORICET) 50-325-40 MG TABLET    Take 1-2 tablets by mouth every 6 (six) hours as needed for headache.   CARVEDILOL (COREG) 25 MG TABLET    Take 25 mg by mouth 2 (two) times daily.   CHOLECALCIFEROL (VITAMIN D3) 25 MCG (1000 UNIT) TABLET    Take 1,000 Units by mouth daily.   CLOPIDOGREL (PLAVIX) 75 MG TABLET    Take 1 tablet (75 mg total) by mouth daily.   CONTINUOUS GLUCOSE RECEIVER (DEXCOM G7 RECEIVER) DEVI    1 Device by Does not apply route continuous.   CONTINUOUS GLUCOSE SENSOR (DEXCOM G7 SENSOR) MISC    1 Device by Does not apply route  continuous.   CONTINUOUS GLUCOSE SENSOR (FREESTYLE LIBRE 3 SENSOR) MISC    1 Device by Does not apply route continuous.   CYCLOSPORINE (RESTASIS) 0.05 % OPHTHALMIC EMULSION    Place 2 drops into both eyes 2 (two) times daily.   DULOXETINE (CYMBALTA) 60 MG CAPSULE    Take 1 capsule (60 mg total) by mouth daily.   EPINEPHRINE 0.3 MG/0.3 ML IJ SOAJ INJECTION    Inject 0.3 mg into the muscle as needed for anaphylaxis.   EZETIMIBE (ZETIA) 10 MG TABLET    Take 1 tablet (10 mg total) by mouth daily.   FUROSEMIDE (LASIX) 20 MG TABLET    Take 1 tablet (20 mg total) by mouth daily.   GABAPENTIN (NEURONTIN) 600 MG TABLET    600 mg qAM, 600 mg qPM, 1200 mg QHS   GLUCAGON (GVOKE HYPOPEN 2-PACK) 1 MG/0.2ML SOAJ    INJECT 1 MG INTO THE SKIN ONCE AS NEEDED FOR UP TO 1 DOSE (HYPOGLYCEMIA). ONLY FOR SEVERE LOW BLOOD SUGAR NOT RESPONDING TO DRINKING JUICE/GLUCOSE TABLETS, WHEN CBG IS <70   GLUCOSE BLOOD (BLOOD GLUCOSE TEST STRIPS) STRP    Use as directed to monitor blood sugar up to 4x a day FOR E11.65 Z79.4   GLUCOSE BLOOD (BLOOD GLUCOSE TEST STRIPS) STRP    1 each by In Vitro route in the morning, at noon, and at bedtime. May substitute to any manufacturer covered by patient's insurance.   HYDRALAZINE (APRESOLINE) 50 MG TABLET    Take 1 tablet by mouth 3 (three) times daily.   INSULIN GLARGINE, 2 UNIT DIAL, (TOUJEO MAX SOLOSTAR) 300 UNIT/ML SOLOSTAR PEN    20 units in am   INSULIN LISPRO (HUMALOG KWIKPEN) 100 UNIT/ML KWIKPEN    Inject up to 7 units before each meal   LANCET DEVICE MISC    1 each by Does not apply route in the morning, at noon, and at bedtime. May substitute to any manufacturer covered by patient's insurance.   LANCETS MISC    Use as directed to monitor blood sugar up to 4x a day FOR E11.65 Z79.4   LANCETS MISC. MISC    1 each by Does not apply route  in the morning, at noon, and at bedtime. May substitute to any manufacturer covered by patient's insurance.   LEVETIRACETAM (KEPPRA) 500 MG TABLET     Take 500-750 mg by mouth See admin instructions. Take 1 tablet (500mg ) by mouth every morning and take 1 tablets by mouth (750mg ) by mouth every night   MECLIZINE (ANTIVERT) 25 MG TABLET    Take 1 tablet (25 mg total) by mouth 3 (three) times daily as needed for dizziness.   MIRABEGRON ER (MYRBETRIQ) 50 MG TB24 TABLET    Take 1 tablet (50 mg total) by mouth daily.   PANTOPRAZOLE (PROTONIX) 40 MG TABLET    Take 1 tablet (40 mg total) by mouth daily.   POLYETHYLENE GLYCOL (MIRALAX / GLYCOLAX) 17 G PACKET    Take 17 g by mouth daily as needed for mild constipation.   SENNA-DOCUSATE (SENOKOT-S) 8.6-50 MG TABLET    Take 1 tablet by mouth at bedtime as needed for mild constipation.   TIZANIDINE (ZANAFLEX) 4 MG TABLET    Take 1 tablet (4 mg total) by mouth at bedtime as needed.   UBROGEPANT (UBRELVY) 50 MG TABS    50 mg or 100 mg taken orally with or without food. If needed, a second dose may be taken at least 2 hours after the initial dose. The maximum dose in a 24-hour period is 200 mg   WIXELA INHUB 250-50 MCG/DOSE AEPB    Inhale 1 puff into the lungs 2 (two) times daily.  Modified Medications   Modified Medication Previous Medication   ROSUVASTATIN (CRESTOR) 40 MG TABLET rosuvastatin (CRESTOR) 10 MG tablet      Take 1 tablet (40 mg total) by mouth daily.    Take 1 tablet (10 mg total) by mouth daily.  Discontinued Medications   No medications on file    Allergies Allergies  Allergen Reactions   Codeine Swelling and Other (See Comments)    Swelling and burning of mouth (inside)   [place] [Dapagliflozin] Other (See Comments)    Recurrent UTI with farxiga   Lactose Intolerance (Gi) Nausea And Vomiting    Past Medical History Past Medical History:  Diagnosis Date   Anxiety and depression 09/13/2006   Qualifier: Diagnosis of  By: Daphine Deutscher [name], [place]     Arthritis    joint pain    Asthma    COPD (chronic obstructive pulmonary disease) (HCC)    chronic bronchitis    Depression     Diabetes mellitus    since age 73; type 2 IDDM   Diabetic neuropathy, painful (HCC)    FEET AND HANDS   Diabetic retinopathy (HCC)    Diastolic dysfunction    a.  Echo 11/17: EF 60-65%, mild LVH, no RWMA, Gr1DD, mild AI, dilated aortic root measuring 38 mm, mildly dilated ascending aorta   Dilated aortic root (HCC)    38mm by echo 11/2015   Dizziness    secondary to diabetes and hypertension    Epilepsy idiopathic petit mal (HCC)    last seizure 2012;controlled w/ topomax   GERD (gastroesophageal reflux disease)    Glaucoma    NOT ON ANY EYE DROPS    Headache(784.0)    migraines    Heart murmur    born with    History of stress test    a. 11/17: Normal perfusion, EF 53%, normal study   Hx of blood clots    hematomas removed from left side of brain from 11mos to 58yrs old  Hyperlipidemia    Hypertension    Legally blind    left eye    MIGRAINE HEADACHE 09/14/2006   Qualifier: Diagnosis of  By: Daphine Deutscher [name], [place]     Myocardial infarction [facility])    a.  Patient reported history of without objective documentation   Nephrolithiasis    frequent urination , urination at nite  PT SEEN IN ER 09/22/11 FOR BACK AND RT SIDED PAIN--HAS KNOWN STONE RT URETER AND UA IN ER SHOWED UTI   Pain 09/23/2011   LOWER BACK AND RIGHT SIDE--PT HAS RIGHT URETERAL STONE   Pneumonia    hx of 2009   Pyelonephritis    SBO (small bowel obstruction) (HCC) 04/23/2021   Seizures (HCC)    Sleep apnea    [facility];does not use Cpap ; mild   Small bowel obstruction (HCC) 05/18/2021   Stress incontinence    Stroke (HCC)    last 2003  RESIDUAL LEFT LEG WEAKNESS--NO OTHER RESIDUAL PROBLEMS    Past Surgical History Past Surgical History:  Procedure Laterality Date   BRAIN HEMATOMA EVACUATION     five procedures total, first procedure when 47 months old, last at 57 years of age.   CYSTOSCOPY W/ RETROGRADES  09/24/2011   Procedure: CYSTOSCOPY WITH RETROGRADE PYELOGRAM;  Surgeon: Anner Crete, MD;  Location: [facility];  Service: Urology;  Laterality: Right;   CYSTOSCOPY WITH URETEROSCOPY  09/24/2011   Procedure: CYSTOSCOPY WITH URETEROSCOPY;  Surgeon: Anner Crete, MD;  Location: [facility];  Service: Urology;  Laterality: Right;  Balloon dilation right ureter    CYSTOSCOPY/RETROGRADE/URETEROSCOPY  08/24/2011   Procedure: CYSTOSCOPY/RETROGRADE/URETEROSCOPY;  Surgeon: Milford Cage, MD;  Location: [facility];  Service: Urology;  Laterality: Right;  Cysto, Right retrograde Pyelogram, right stent placement.    INCISIONAL HERNIA REPAIR  05/05/2011   Procedure: LAPAROSCOPIC INCISIONAL HERNIA;  Surgeon: Emelia Loron, MD;  Location: [facility] OR;  Service: General;  Laterality: N/A;   kidney stone removal     LAPAROTOMY N/A 06/12/2021   Procedure: EXPLORATORY LAPAROTOMY, LYSIS OF ADHESIONS;  Surgeon: Henrene Dodge, MD;  Location: [facility];  Service: General;  Laterality: N/A;   MASS EXCISION  05/05/2011   Procedure: EXCISION MASS;  Surgeon: Emelia Loron, MD;  [facility];  Service: General;  Laterality: Right;   [facility]     RETINAL DETACHMENT SURGERY Left 1990   RIGHT/LEFT HEART CATH AND CORONARY ANGIOGRAPHY N/A 04/28/2019   Procedure: RIGHT/LEFT HEART CATH AND CORONARY ANGIOGRAPHY;  Surgeon: Alwyn Pea, MD;  Location: [facility];  Service: Cardiovascular;  Laterality: N/A;   SHUNT REMOVAL     shunt inserted at age 40 removed at age 48    VAGINAL HYSTERECTOMY  23    Family History family history includes ADD / ADHD in her maternal grandfather; Birth defects in her daughter and maternal grandfather; Brain cancer in her maternal grandmother; Cancer in her mother; Cirrhosis in her maternal grandfather; Diabetes in her maternal grandfather; Hypertension in her daughter, maternal grandmother, and mother; Uterine cancer in her mother.  Social History Social History   Socioeconomic History   Marital status: Widowed    Spouse name: Not on file   Number of children: 2    Years of education: Not on file   Highest education level: 12th grade  Occupational History   Occupation: disabled  Tobacco Use   Smoking status: Former    Current packs/day: 0.00    Average packs/day: 1 pack/day for 15.0  years (15.0 ttl pk-yrs)    Types: Cigarettes    Start date: 01/20/1972    Quit date: 01/20/1987    Years since quitting: 35.6   Smokeless tobacco: Never   Tobacco comments:    quit more than 20 years  Vaping Use   Vaping status: Never Used  Substance and Sexual Activity   Alcohol use: Yes    Alcohol/week: 1.0 standard drink of alcohol    Types: 1 Glasses of wine per week   Drug use: No    Comment: hx of marijuana use no longer uses    Sexual activity: Yes    Partners: Male    Birth control/protection: Surgical  Other Topics Concern   Not on file  Social History Narrative   Pt lives with her daughter   Social Determinants of Health   Financial Resource Strain: Medium Risk (06/28/2022)   Overall Financial Resource Strain (CARDIA)    Difficulty of Paying Living Expenses: Somewhat hard  Food Insecurity: No Food Insecurity (08/13/2022)   Hunger Vital Sign    Worried About Running Out of Food in the Last Year: Never true    Ran Out of Food in the Last Year: Never true  Recent Concern: Food Insecurity - Food Insecurity Present (06/28/2022)   Hunger Vital Sign    Worried About Running Out of Food in the Last Year: Often true    Ran Out of Food in the Last Year: Often true  Transportation Needs: No Transportation Needs (08/13/2022)   PRAPARE - Administrator, Civil Service (Medical): No    Lack of Transportation (Non-Medical): No  Physical Activity: Insufficiently Active (06/28/2022)   Exercise Vital Sign    Days of Exercise per Week: 1 day    Minutes of Exercise per Session: 10 min  Stress: No Stress Concern Present (06/28/2022)   [employer] of Occupational Health - Occupational Stress Questionnaire    Feeling of Stress : Not at all  Social  Connections: Moderately Integrated (06/28/2022)   Social Connection and Isolation Panel [NHANES]    Frequency of Communication with Friends and Family: Twice a week    Frequency of Social Gatherings with Friends and Family: More than three times a week    Attends Religious Services: 1 to 4 times per year    Active Member of [employer] or Organizations: Yes    Attends Banker Meetings: Never    Marital Status: Widowed  Intimate Partner Violence: Not At Risk (08/03/2022)   Humiliation, Afraid, Rape, and Kick questionnaire    Fear of Current or Ex-Partner: No    Emotionally Abused: No    Physically Abused: No    Sexually Abused: No    Lab Results  Component Value Date   HGBA1C 9.7 (H) 07/20/2022   HGBA1C 9.8 (A) 04/16/2022   HGBA1C 12.0 (H) 01/23/2022   Lab Results  Component Value Date   CHOL 128 06/02/2021   Lab Results  Component Value Date   HDL 41 (L) 06/02/2021   Lab Results  Component Value Date   LDLCALC 67 06/02/2021   Lab Results  Component Value Date   TRIG 71 06/19/2021   Lab Results  Component Value Date   CHOLHDL 3.1 06/02/2021   Lab Results  Component Value Date   CREATININE 0.88 08/10/2022   Lab Results  Component Value Date   GFR 74.92 04/14/2022   Lab Results  Component Value Date   MICROALBUR 3.6 (H) 04/16/2022  Component Value Date/Time   NA 134 (L) 08/09/2022 0534   NA 138 05/08/2015 1106   K 4.6 08/09/2022 0534   CL 107 08/09/2022 0534   CO2 21 (L) 08/09/2022 0534   GLUCOSE 114 (H) 08/09/2022 0534   BUN 24 (H) 08/09/2022 0534   BUN 14 05/08/2015 1106   CREATININE 0.88 08/10/2022 0459   CREATININE 0.95 01/23/2022 1157   CALCIUM 8.6 (L) 08/09/2022 0534   PROT 7.7 08/03/2022 1406   PROT 7.7 05/08/2015 1106   ALBUMIN 3.8 08/03/2022 1406   ALBUMIN 4.1 05/08/2015 1106   AST 16 08/03/2022 1406   ALT 27 08/03/2022 1406   ALKPHOS 99 08/03/2022 1406   BILITOT 0.5 08/03/2022 1406   BILITOT 0.3 05/08/2015 1106   GFRNONAA >60  08/10/2022 0459   GFRNONAA 81 11/28/2018 0000   GFRAA >60 05/30/2019 1543   GFRAA 94 11/28/2018 0000      Latest Ref Rng & Units 08/10/2022    4:59 AM 08/09/2022    5:34 AM 08/08/2022    8:57 AM  BMP  Glucose 70 - 99 mg/dL  604  540   BUN 6 - 20 mg/dL  24  35   Creatinine 9.81 - 1.00 mg/dL 1.91  4.78  2.95   Sodium 135 - 145 mmol/L  134  136   Potassium 3.5 - 5.1 mmol/L  4.6  4.4   Chloride 98 - 111 mmol/L  107  105   CO2 22 - 32 mmol/L  21  25   Calcium 8.9 - 10.3 mg/dL  8.6  9.0        Component Value Date/Time   WBC 7.5 08/09/2022 0534   RBC 4.02 08/09/2022 0534   HGB 11.8 (L) 08/09/2022 0534   HGB 13.1 05/08/2015 1106   HCT 37.3 08/09/2022 0534   HCT 39.5 05/08/2015 1106   PLT 214 08/09/2022 0534   PLT 274 05/08/2015 1106   MCV 92.8 08/09/2022 0534   MCV 91 05/08/2015 1106   MCH 29.4 08/09/2022 0534   MCHC 31.6 08/09/2022 0534   RDW 14.0 08/09/2022 0534   RDW 14.1 05/08/2015 1106   LYMPHSABS 1.7 08/03/2022 1406   LYMPHSABS 1.7 05/08/2015 1106   MONOABS 0.5 08/03/2022 1406   EOSABS 0.0 08/03/2022 1406   EOSABS 0.0 05/08/2015 1106   BASOSABS 0.0 08/03/2022 1406   BASOSABS 0.0 05/08/2015 1106     Parts of this note may have been dictated using voice recognition software. There may be variances in spelling and vocabulary which are unintentional. Not all errors are proofread. Please notify the Thereasa Parkin if any discrepancies are noted or if the meaning of any statement is not clear.

## 2022-09-13 ENCOUNTER — Encounter: Payer: Self-pay | Admitting: "Endocrinology

## 2022-09-14 ENCOUNTER — Telehealth: Payer: Self-pay | Admitting: *Deleted

## 2022-09-14 ENCOUNTER — Other Ambulatory Visit: Payer: Self-pay

## 2022-09-14 ENCOUNTER — Encounter: Payer: Self-pay | Admitting: "Endocrinology

## 2022-09-14 ENCOUNTER — Telehealth: Payer: Self-pay | Admitting: "Endocrinology

## 2022-09-14 ENCOUNTER — Encounter: Payer: Self-pay | Admitting: Student in an Organized Health Care Education/Training Program

## 2022-09-14 DIAGNOSIS — E1165 Type 2 diabetes mellitus with hyperglycemia: Secondary | ICD-10-CM

## 2022-09-14 DIAGNOSIS — I69354 Hemiplegia and hemiparesis following cerebral infarction affecting left non-dominant side: Secondary | ICD-10-CM | POA: Diagnosis not present

## 2022-09-14 DIAGNOSIS — I119 Hypertensive heart disease without heart failure: Secondary | ICD-10-CM | POA: Diagnosis not present

## 2022-09-14 DIAGNOSIS — J449 Chronic obstructive pulmonary disease, unspecified: Secondary | ICD-10-CM | POA: Diagnosis not present

## 2022-09-14 DIAGNOSIS — N390 Urinary tract infection, site not specified: Secondary | ICD-10-CM | POA: Diagnosis not present

## 2022-09-14 DIAGNOSIS — E1140 Type 2 diabetes mellitus with diabetic neuropathy, unspecified: Secondary | ICD-10-CM | POA: Diagnosis not present

## 2022-09-14 DIAGNOSIS — Z794 Long term (current) use of insulin: Secondary | ICD-10-CM | POA: Diagnosis not present

## 2022-09-14 DIAGNOSIS — H5442A5 Blindness left eye category 5, normal vision right eye: Secondary | ICD-10-CM | POA: Diagnosis not present

## 2022-09-14 DIAGNOSIS — I519 Heart disease, unspecified: Secondary | ICD-10-CM | POA: Diagnosis not present

## 2022-09-14 MED ORDER — INSULIN LISPRO (1 UNIT DIAL) 100 UNIT/ML (KWIKPEN)
PEN_INJECTOR | SUBCUTANEOUS | 0 refills | Status: DC
Start: 2022-09-14 — End: 2022-09-16

## 2022-09-14 MED ORDER — FREESTYLE LIBRE 3 SENSOR MISC
1.0000 | 0 refills | Status: AC
Start: 2022-09-14 — End: ?

## 2022-09-14 NOTE — Addendum Note (Signed)
Addended by: Berniece Salines on: 09/14/2022 08:27 AM   Modules accepted: Level of Service

## 2022-09-14 NOTE — Telephone Encounter (Signed)
MEDICATION: 1)FreeStyle Libre 3 Sensor Continuous Glucose Sensor (FREESTYLE LIBRE 3 SENSOR) MISC  2) Toujeo Max SoloStar insulin glargine, 2 Unit Dial, (TOUJEO MAX SOLOSTAR) 300 UNIT/ML Solostar Pen  3) insulin lispro insulin lispro (HUMALOG KWIKPEN) 100 UNIT/ML KwikPen  PHARMACY:  [facility] [address] [place] [address]   HAS THE PATIENT CONTACTED THEIR PHARMACY?  Yes  IS THIS A 90 DAY SUPPLY : Yes  IS PATIENT OUT OF MEDICATION: No  IF NOT; HOW MUCH IS LEFT: Not sure  LAST APPOINTMENT DATE: @8 /23/2024  NEXT APPOINTMENT DATE:@10 /11/2022  DO WE HAVE YOUR PERMISSION TO LEAVE A DETAILED MESSAGE?: Yes  OTHER COMMENTS:    **Let patient know to contact pharmacy at the end of the day to make sure medication is ready. **  ** Please notify patient to allow 48-72 hours to process**  **Encourage patient to contact the pharmacy for refills or they can request refills through [facility]**

## 2022-09-15 DIAGNOSIS — G4733 Obstructive sleep apnea (adult) (pediatric): Secondary | ICD-10-CM | POA: Diagnosis not present

## 2022-09-16 ENCOUNTER — Telehealth: Payer: Self-pay

## 2022-09-16 DIAGNOSIS — I119 Hypertensive heart disease without heart failure: Secondary | ICD-10-CM | POA: Diagnosis not present

## 2022-09-16 DIAGNOSIS — E1165 Type 2 diabetes mellitus with hyperglycemia: Secondary | ICD-10-CM | POA: Diagnosis not present

## 2022-09-16 DIAGNOSIS — I69354 Hemiplegia and hemiparesis following cerebral infarction affecting left non-dominant side: Secondary | ICD-10-CM | POA: Diagnosis not present

## 2022-09-16 DIAGNOSIS — H5442A5 Blindness left eye category 5, normal vision right eye: Secondary | ICD-10-CM | POA: Diagnosis not present

## 2022-09-16 DIAGNOSIS — E1140 Type 2 diabetes mellitus with diabetic neuropathy, unspecified: Secondary | ICD-10-CM | POA: Diagnosis not present

## 2022-09-16 DIAGNOSIS — N390 Urinary tract infection, site not specified: Secondary | ICD-10-CM | POA: Diagnosis not present

## 2022-09-16 DIAGNOSIS — J449 Chronic obstructive pulmonary disease, unspecified: Secondary | ICD-10-CM | POA: Diagnosis not present

## 2022-09-16 DIAGNOSIS — Z794 Long term (current) use of insulin: Secondary | ICD-10-CM | POA: Diagnosis not present

## 2022-09-16 DIAGNOSIS — I519 Heart disease, unspecified: Secondary | ICD-10-CM | POA: Diagnosis not present

## 2022-09-16 MED ORDER — FREESTYLE LIBRE 3 SENSOR MISC: 1.0000 | 3 refills | Status: DC

## 2022-09-16 MED ORDER — INSULIN LISPRO (1 UNIT DIAL) 100 UNIT/ML (KWIKPEN)
PEN_INJECTOR | SUBCUTANEOUS | 1 refills | Status: DC
Start: 2022-09-16 — End: 2022-09-25

## 2022-09-16 MED ORDER — TOUJEO MAX SOLOSTAR 300 UNIT/ML ~~LOC~~ SOPN
PEN_INJECTOR | SUBCUTANEOUS | 1 refills | Status: DC
Start: 2022-09-16 — End: 2022-09-25

## 2022-09-16 NOTE — Telephone Encounter (Signed)
I left [name] a voicemail and let her know that I put Dr Nicholos Johns instructions and recommendations in her Mychart for her to review

## 2022-09-16 NOTE — Telephone Encounter (Signed)
[name] is at the home of [name] today and upon assessment her Blood Sugars are running in the 200;s and 300's using Freestyle Libre [name] adjusted her medications on Friday at her office visit , please advise , report is printed for [name]

## 2022-09-16 NOTE — Telephone Encounter (Signed)
done

## 2022-09-16 NOTE — Addendum Note (Signed)
Addended by: Lisabeth Pick on: 09/16/2022 09:06 AM   Modules accepted: Orders

## 2022-09-18 ENCOUNTER — Telehealth: Payer: Self-pay | Admitting: Family Medicine

## 2022-09-18 NOTE — Telephone Encounter (Signed)
Robin OTA calling from [facility] is calling to report that the patient had a missed visit Blood sugars over 300 awaiting a call back. Agent will discuss with NT Please advise [phone]- [phone]

## 2022-09-23 ENCOUNTER — Telehealth: Payer: Self-pay | Admitting: Family Medicine

## 2022-09-23 ENCOUNTER — Other Ambulatory Visit: Payer: Self-pay

## 2022-09-23 ENCOUNTER — Telehealth: Payer: Self-pay

## 2022-09-23 DIAGNOSIS — J449 Chronic obstructive pulmonary disease, unspecified: Secondary | ICD-10-CM | POA: Diagnosis not present

## 2022-09-23 DIAGNOSIS — E1165 Type 2 diabetes mellitus with hyperglycemia: Secondary | ICD-10-CM | POA: Diagnosis not present

## 2022-09-23 DIAGNOSIS — Z794 Long term (current) use of insulin: Secondary | ICD-10-CM | POA: Diagnosis not present

## 2022-09-23 DIAGNOSIS — E1140 Type 2 diabetes mellitus with diabetic neuropathy, unspecified: Secondary | ICD-10-CM | POA: Diagnosis not present

## 2022-09-23 DIAGNOSIS — I69354 Hemiplegia and hemiparesis following cerebral infarction affecting left non-dominant side: Secondary | ICD-10-CM | POA: Diagnosis not present

## 2022-09-23 DIAGNOSIS — I119 Hypertensive heart disease without heart failure: Secondary | ICD-10-CM | POA: Diagnosis not present

## 2022-09-23 DIAGNOSIS — N390 Urinary tract infection, site not specified: Secondary | ICD-10-CM | POA: Diagnosis not present

## 2022-09-23 DIAGNOSIS — I519 Heart disease, unspecified: Secondary | ICD-10-CM | POA: Diagnosis not present

## 2022-09-23 DIAGNOSIS — H5442A5 Blindness left eye category 5, normal vision right eye: Secondary | ICD-10-CM | POA: Diagnosis not present

## 2022-09-23 NOTE — Telephone Encounter (Signed)
I spoke to [facility] and advised her to increase the Humalog to 10 units prior to meals

## 2022-09-23 NOTE — Telephone Encounter (Signed)
Zella Ball from [facility] called to report the patient's blood sugar   Best contact: [phone]   Fasting BS: 198 After meal: 230

## 2022-09-23 NOTE — Telephone Encounter (Signed)
Robin from [facility] called informing Endo of [name] blood sugar this morning , fasting it was 198 and after her meal it did go up to 230 , please advise of any changes on Dr Shellia Cleverly behalf , thanks

## 2022-09-23 NOTE — Telephone Encounter (Signed)
[name] states per there protocol they need to let PCP know as well. She did inform Endo.

## 2022-09-24 ENCOUNTER — Telehealth: Payer: Self-pay | Admitting: Student in an Organized Health Care Education/Training Program

## 2022-09-24 DIAGNOSIS — I69354 Hemiplegia and hemiparesis following cerebral infarction affecting left non-dominant side: Secondary | ICD-10-CM | POA: Diagnosis not present

## 2022-09-24 DIAGNOSIS — Z794 Long term (current) use of insulin: Secondary | ICD-10-CM | POA: Diagnosis not present

## 2022-09-24 DIAGNOSIS — I119 Hypertensive heart disease without heart failure: Secondary | ICD-10-CM | POA: Diagnosis not present

## 2022-09-24 DIAGNOSIS — H5442A5 Blindness left eye category 5, normal vision right eye: Secondary | ICD-10-CM | POA: Diagnosis not present

## 2022-09-24 DIAGNOSIS — N390 Urinary tract infection, site not specified: Secondary | ICD-10-CM | POA: Diagnosis not present

## 2022-09-24 DIAGNOSIS — E1140 Type 2 diabetes mellitus with diabetic neuropathy, unspecified: Secondary | ICD-10-CM | POA: Diagnosis not present

## 2022-09-24 DIAGNOSIS — J449 Chronic obstructive pulmonary disease, unspecified: Secondary | ICD-10-CM | POA: Diagnosis not present

## 2022-09-24 DIAGNOSIS — E1165 Type 2 diabetes mellitus with hyperglycemia: Secondary | ICD-10-CM | POA: Diagnosis not present

## 2022-09-24 DIAGNOSIS — I519 Heart disease, unspecified: Secondary | ICD-10-CM | POA: Diagnosis not present

## 2022-09-24 NOTE — Telephone Encounter (Signed)
Spoke with patient and she states that she just put the patch on and would give it a few more days and let us know how it is doing.

## 2022-09-24 NOTE — Telephone Encounter (Signed)
[name] from [facility] called to report pain parameters for patient [name]. Reports 8/10 pain score. In left hip down into leg. Please call patient

## 2022-09-25 ENCOUNTER — Other Ambulatory Visit: Payer: Self-pay

## 2022-09-25 DIAGNOSIS — Z794 Long term (current) use of insulin: Secondary | ICD-10-CM

## 2022-09-25 MED ORDER — INSULIN LISPRO (1 UNIT DIAL) 100 UNIT/ML (KWIKPEN)
PEN_INJECTOR | SUBCUTANEOUS | 1 refills | Status: DC
Start: 2022-09-25 — End: 2022-12-22

## 2022-09-25 MED ORDER — FREESTYLE LIBRE 3 SENSOR MISC
1.0000 | 3 refills | Status: AC
Start: 2022-09-25 — End: ?

## 2022-09-25 MED ORDER — TOUJEO MAX SOLOSTAR 300 UNIT/ML ~~LOC~~ SOPN: PEN_INJECTOR | SUBCUTANEOUS | 1 refills | Status: DC

## 2022-09-29 DIAGNOSIS — Z794 Long term (current) use of insulin: Secondary | ICD-10-CM | POA: Diagnosis not present

## 2022-09-29 DIAGNOSIS — I69354 Hemiplegia and hemiparesis following cerebral infarction affecting left non-dominant side: Secondary | ICD-10-CM | POA: Diagnosis not present

## 2022-09-29 DIAGNOSIS — E1140 Type 2 diabetes mellitus with diabetic neuropathy, unspecified: Secondary | ICD-10-CM | POA: Diagnosis not present

## 2022-09-29 DIAGNOSIS — J449 Chronic obstructive pulmonary disease, unspecified: Secondary | ICD-10-CM | POA: Diagnosis not present

## 2022-09-29 DIAGNOSIS — H5442A5 Blindness left eye category 5, normal vision right eye: Secondary | ICD-10-CM | POA: Diagnosis not present

## 2022-09-29 DIAGNOSIS — N390 Urinary tract infection, site not specified: Secondary | ICD-10-CM | POA: Diagnosis not present

## 2022-09-29 DIAGNOSIS — E1165 Type 2 diabetes mellitus with hyperglycemia: Secondary | ICD-10-CM | POA: Diagnosis not present

## 2022-09-29 DIAGNOSIS — I119 Hypertensive heart disease without heart failure: Secondary | ICD-10-CM | POA: Diagnosis not present

## 2022-09-29 DIAGNOSIS — I519 Heart disease, unspecified: Secondary | ICD-10-CM | POA: Diagnosis not present

## 2022-09-30 ENCOUNTER — Other Ambulatory Visit: Payer: Self-pay | Admitting: Nurse Practitioner

## 2022-09-30 DIAGNOSIS — E1165 Type 2 diabetes mellitus with hyperglycemia: Secondary | ICD-10-CM | POA: Diagnosis not present

## 2022-09-30 DIAGNOSIS — I119 Hypertensive heart disease without heart failure: Secondary | ICD-10-CM | POA: Diagnosis not present

## 2022-09-30 DIAGNOSIS — H5442A5 Blindness left eye category 5, normal vision right eye: Secondary | ICD-10-CM | POA: Diagnosis not present

## 2022-09-30 DIAGNOSIS — E1140 Type 2 diabetes mellitus with diabetic neuropathy, unspecified: Secondary | ICD-10-CM | POA: Diagnosis not present

## 2022-09-30 DIAGNOSIS — J449 Chronic obstructive pulmonary disease, unspecified: Secondary | ICD-10-CM | POA: Diagnosis not present

## 2022-09-30 DIAGNOSIS — N390 Urinary tract infection, site not specified: Secondary | ICD-10-CM | POA: Diagnosis not present

## 2022-09-30 DIAGNOSIS — I519 Heart disease, unspecified: Secondary | ICD-10-CM | POA: Diagnosis not present

## 2022-09-30 DIAGNOSIS — Z794 Long term (current) use of insulin: Secondary | ICD-10-CM | POA: Diagnosis not present

## 2022-09-30 DIAGNOSIS — I69354 Hemiplegia and hemiparesis following cerebral infarction affecting left non-dominant side: Secondary | ICD-10-CM | POA: Diagnosis not present

## 2022-10-01 NOTE — Telephone Encounter (Signed)
Requested Prescriptions  Pending Prescriptions Disp Refills   furosemide (LASIX) 20 MG tablet [Pharmacy Med Name: Furosemide 20 MG Oral Tablet] 90 tablet 1    Sig: TAKE 1 TABLET BY MOUTH DAILY     Cardiovascular:  Diuretics - Loop Failed - 09/30/2022  5:11 AM      Failed - Ca in normal range and within 180 days    Calcium  Date Value Ref Range Status  08/09/2022 8.6 (L) 8.9 - 10.3 mg/dL Final   Calcium, Ion  Date Value Ref Range Status  12/22/2015 1.22 1.15 - 1.40 mmol/L Final         Failed - Na in normal range and within 180 days    Sodium  Date Value Ref Range Status  08/09/2022 134 (L) 135 - 145 mmol/L Final  05/08/2015 138 134 - 144 mmol/L Final         Failed - Mg Level in normal range and within 180 days    Magnesium  Date Value Ref Range Status  10/18/2021 2.1 1.7 - 2.4 mg/dL Final    Comment:    Performed at [facility], [address] [place], [place] [address]         Passed - K in normal range and within 180 days    Potassium  Date Value Ref Range Status  08/09/2022 4.6 3.5 - 5.1 mmol/L Final         Passed - Cr in normal range and within 180 days    Creat  Date Value Ref Range Status  01/23/2022 0.95 0.50 - 1.03 mg/dL Final   Creatinine, Ser  Date Value Ref Range Status  08/10/2022 0.88 0.44 - 1.00 mg/dL Final   Creatinine,U  Date Value Ref Range Status  04/16/2022 125.4 mg/dL Final   Creatinine, Urine  Date Value Ref Range Status  11/17/2021 25 20 - 275 mg/dL Final         Passed - Cl in normal range and within 180 days    Chloride  Date Value Ref Range Status  08/09/2022 107 98 - 111 mmol/L Final         Passed - Last BP in normal range    BP Readings from Last 1 Encounters:  09/11/22 120/65         Passed - Valid encounter within last 6 months    Recent Outpatient Visits           1 month ago Bilateral hip pain   [facility] Margarita Mail, DO   1 month ago Acute cystitis without hematuria    [facility] Berniece Salines, [name]   3 months ago AKI (acute kidney injury) [facility])   [place] [facility] [name], [name]   8 months ago Uncontrolled type 2 diabetes mellitus with hyperglycemia [facility])   [facility] [name], [name]   9 months ago Uncontrolled type 2 diabetes mellitus with hyperglycemia [facility])   [place] [facility] Angelica Chessman, Sheliah Mends, [name]               amLODipine (NORVASC) 10 MG tablet [Pharmacy Med Name: amLODIPine Besylate 10 MG Oral Tablet] 90 tablet 1    Sig: TAKE 1 TABLET BY MOUTH DAILY     Cardiovascular: Calcium Channel Blockers 2 Passed - 09/30/2022  5:11 AM      Passed - Last BP in normal range    BP Readings from Last 1  Encounters:  09/11/22 120/65         Passed - Last Heart Rate in normal range    Pulse Readings from Last 1 Encounters:  09/11/22 61         Passed - Valid encounter within last 6 months    Recent Outpatient Visits           1 month ago Bilateral hip pain   [facility] Margarita Mail, DO   1 month ago Acute cystitis without hematuria   [facility] Berniece Salines, [name]   3 months ago AKI (acute kidney injury) [facility])   [facility] [name], [name]   8 months ago Uncontrolled type 2 diabetes mellitus with hyperglycemia [facility])   [place] [facility] [name], [name]   9 months ago Uncontrolled type 2 diabetes mellitus with hyperglycemia [facility])   [facility] [name], [place]

## 2022-10-02 DIAGNOSIS — E1165 Type 2 diabetes mellitus with hyperglycemia: Secondary | ICD-10-CM | POA: Diagnosis not present

## 2022-10-02 DIAGNOSIS — I10 Essential (primary) hypertension: Secondary | ICD-10-CM | POA: Diagnosis not present

## 2022-10-02 DIAGNOSIS — I119 Hypertensive heart disease without heart failure: Secondary | ICD-10-CM | POA: Diagnosis not present

## 2022-10-02 DIAGNOSIS — I519 Heart disease, unspecified: Secondary | ICD-10-CM | POA: Diagnosis not present

## 2022-10-02 DIAGNOSIS — Z794 Long term (current) use of insulin: Secondary | ICD-10-CM | POA: Diagnosis not present

## 2022-10-02 DIAGNOSIS — N390 Urinary tract infection, site not specified: Secondary | ICD-10-CM | POA: Diagnosis not present

## 2022-10-02 DIAGNOSIS — I69354 Hemiplegia and hemiparesis following cerebral infarction affecting left non-dominant side: Secondary | ICD-10-CM | POA: Diagnosis not present

## 2022-10-02 DIAGNOSIS — G4733 Obstructive sleep apnea (adult) (pediatric): Secondary | ICD-10-CM | POA: Diagnosis not present

## 2022-10-02 DIAGNOSIS — E1140 Type 2 diabetes mellitus with diabetic neuropathy, unspecified: Secondary | ICD-10-CM | POA: Diagnosis not present

## 2022-10-02 DIAGNOSIS — H5442A5 Blindness left eye category 5, normal vision right eye: Secondary | ICD-10-CM | POA: Diagnosis not present

## 2022-10-02 DIAGNOSIS — J449 Chronic obstructive pulmonary disease, unspecified: Secondary | ICD-10-CM | POA: Diagnosis not present

## 2022-10-02 DIAGNOSIS — Z8673 Personal history of transient ischemic attack (TIA), and cerebral infarction without residual deficits: Secondary | ICD-10-CM | POA: Diagnosis not present

## 2022-10-05 ENCOUNTER — Other Ambulatory Visit: Payer: Self-pay | Admitting: Student in an Organized Health Care Education/Training Program

## 2022-10-05 ENCOUNTER — Other Ambulatory Visit: Payer: Self-pay | Admitting: Family Medicine

## 2022-10-05 ENCOUNTER — Other Ambulatory Visit: Payer: Self-pay | Admitting: Nurse Practitioner

## 2022-10-05 ENCOUNTER — Other Ambulatory Visit: Payer: Self-pay | Admitting: "Endocrinology

## 2022-10-05 ENCOUNTER — Encounter: Payer: Self-pay | Admitting: "Endocrinology

## 2022-10-05 DIAGNOSIS — E119 Type 2 diabetes mellitus without complications: Secondary | ICD-10-CM

## 2022-10-05 DIAGNOSIS — E1165 Type 2 diabetes mellitus with hyperglycemia: Secondary | ICD-10-CM

## 2022-10-05 MED ORDER — DEXCOM G7 SENSOR MISC: 1.0000 | 0 refills | Status: DC

## 2022-10-05 MED ORDER — LANCET DEVICE MISC: 1.0000 | Freq: Three times a day (TID) | 0 refills | Status: AC

## 2022-10-05 MED ORDER — BLOOD GLUCOSE MONITORING SUPPL DEVI: 1.0000 | Freq: Three times a day (TID) | 0 refills | Status: DC

## 2022-10-07 ENCOUNTER — Other Ambulatory Visit: Payer: Self-pay

## 2022-10-07 ENCOUNTER — Ambulatory Visit: Payer: 59 | Admitting: Student in an Organized Health Care Education/Training Program

## 2022-10-07 MED ORDER — BLOOD GLUCOSE TEST VI STRP: 1.0000 | ORAL_STRIP | Freq: Three times a day (TID) | 3 refills | Status: DC

## 2022-10-09 ENCOUNTER — Encounter: Payer: Self-pay | Admitting: Nurse Practitioner

## 2022-10-09 ENCOUNTER — Encounter: Payer: Self-pay | Admitting: Family Medicine

## 2022-10-09 NOTE — Telephone Encounter (Signed)
2 different encounters created.

## 2022-10-13 ENCOUNTER — Ambulatory Visit: Payer: Self-pay | Admitting: *Deleted

## 2022-10-13 ENCOUNTER — Encounter: Payer: Self-pay | Admitting: Nurse Practitioner

## 2022-10-13 MED ORDER — ALBUTEROL SULFATE 1.25 MG/3ML IN NEBU: 1.0000 | INHALATION_SOLUTION | Freq: Two times a day (BID) | RESPIRATORY_TRACT | 0 refills | Status: DC

## 2022-10-13 MED ORDER — ALBUTEROL SULFATE HFA 108 (90 BASE) MCG/ACT IN AERS: 1.0000 | INHALATION_SPRAY | Freq: Four times a day (QID) | RESPIRATORY_TRACT | 0 refills | Status: DC | PRN

## 2022-10-13 MED ORDER — BLOOD GLUCOSE MONITOR KIT
PACK | 0 refills | Status: DC
Start: 2022-10-13 — End: 2023-03-15

## 2022-10-13 NOTE — Patient Outreach (Signed)
Care Coordination   Follow Up Visit Note   10/15/2022 Name: [name] MRN: [id number] DOB: 15-Jul-1965  [name] is a 57 y.o. year old female who sees [name], [place] for primary care. I spoke with  [name] by phone today.  What matters to the patients health and wellness today?  Patient state she has been having nausea, no vomiting.  She called PCP office to request medication, advised her that she didn't specify what medication she was requesting.  Also advised that she will likely need at least a virtual visit for diagnosis prior to prescribing medication.  She verbalized understanding and state she will call to schedule.  Denies any urgent concerns, encouraged to contact this care manager with questions.      Goals Addressed             This Visit's Progress    Management of chronic medical conditions (DM, HTN, CHF)        Interventions Today    Flowsheet Row Most Recent Value  Chronic Disease   Chronic disease during today's visit Diabetes, Other  [nausea/abdominal discomfort, hip pain is better]  General Interventions   General Interventions Discussed/Reviewed General Interventions Reviewed, Labs, Vaccines, Durable Medical Equipment (DME), Doctor Visits  Labs Hgb A1c every 3 months  [9.7]  Vaccines Flu  Doctor Visits Discussed/Reviewed Doctor Visits Reviewed, PCP  [pain appt 9/30, endocrine 10/11]  Durable Medical Equipment (DME) BP Cuff, Glucomoter  PCP/Specialist Visits Compliance with follow-up visit  Education Interventions   Education Provided Provided Education  Provided Verbal Education On Nutrition, Labs, Blood Sugar Monitoring, Medication, When to see the doctor  [taking pain medication as prescribed as well as pain patch. blood sugars range 150-260.]  Labs Reviewed Hgb A1c              SDOH assessments and interventions completed:  No     Care Coordination Interventions:  Yes, provided   Follow up plan: Follow  up call scheduled for 10/17    Encounter Outcome:  Patient Visit Completed   Kemper Durie, RN, MSN, [facility] Care Management Care Management Coordinator [phone]

## 2022-10-16 ENCOUNTER — Encounter: Payer: Self-pay | Admitting: Student in an Organized Health Care Education/Training Program

## 2022-10-19 ENCOUNTER — Ambulatory Visit: Payer: 59 | Admitting: Student in an Organized Health Care Education/Training Program

## 2022-10-19 ENCOUNTER — Other Ambulatory Visit: Payer: Self-pay

## 2022-10-19 DIAGNOSIS — E1165 Type 2 diabetes mellitus with hyperglycemia: Secondary | ICD-10-CM

## 2022-10-19 MED ORDER — FREESTYLE LIBRE 3 PLUS SENSOR MISC
1.0000 | 3 refills | Status: DC
Start: 2022-10-19 — End: 2022-12-30

## 2022-10-25 ENCOUNTER — Other Ambulatory Visit: Payer: Self-pay | Admitting: Student in an Organized Health Care Education/Training Program

## 2022-10-25 ENCOUNTER — Other Ambulatory Visit: Payer: Self-pay | Admitting: Nurse Practitioner

## 2022-10-25 ENCOUNTER — Other Ambulatory Visit: Payer: Self-pay | Admitting: Family Medicine

## 2022-10-25 DIAGNOSIS — E1169 Type 2 diabetes mellitus with other specified complication: Secondary | ICD-10-CM

## 2022-10-25 DIAGNOSIS — E1142 Type 2 diabetes mellitus with diabetic polyneuropathy: Secondary | ICD-10-CM

## 2022-10-25 DIAGNOSIS — F330 Major depressive disorder, recurrent, mild: Secondary | ICD-10-CM

## 2022-10-26 NOTE — Telephone Encounter (Signed)
Requested Prescriptions  Pending Prescriptions Disp Refills   DULoxetine (CYMBALTA) 60 MG capsule [Pharmacy Med Name: DULOXETINE HCL DR 60 MG CAP] 90 capsule 0    Sig: TAKE 1 CAPSULE BY MOUTH EVERY DAY     Psychiatry: Antidepressants - SNRI - duloxetine Passed - 10/25/2022  5:08 PM      Passed - Cr in normal range and within 360 days    Creat  Date Value Ref Range Status  01/23/2022 0.95 0.50 - 1.03 mg/dL Final   Creatinine, Ser  Date Value Ref Range Status  08/10/2022 0.88 0.44 - 1.00 mg/dL Final   Creatinine,U  Date Value Ref Range Status  04/16/2022 125.4 mg/dL Final   Creatinine, Urine  Date Value Ref Range Status  11/17/2021 25 20 - 275 mg/dL Final         Passed - eGFR is 30 or above and within 360 days    GFR, Est African American  Date Value Ref Range Status  11/28/2018 94 > OR = 60 mL/min/1.62m2 Final   GFR calc Af Amer  Date Value Ref Range Status  05/30/2019 >60 >60 mL/min Final   GFR, Est Non African American  Date Value Ref Range Status  11/28/2018 81 > OR = 60 mL/min/1.80m2 Final   GFR, Estimated  Date Value Ref Range Status  08/10/2022 >60 >60 mL/min Final    Comment:    (NOTE) Calculated using the CKD-EPI Creatinine Equation (2021) Performed at [facility], [address] [place], [place] [address]    GFR  Date Value Ref Range Status  04/14/2022 74.92 >60.00 mL/min Final    Comment:    Calculated using the CKD-EPI Creatinine Equation (2021)   eGFR  Date Value Ref Range Status  01/23/2022 70 > OR = 60 mL/min/1.51m2 Final         Passed - Completed PHQ-2 or PHQ-9 in the last 360 days      Passed - Last BP in normal range    BP Readings from Last 1 Encounters:  09/11/22 120/65         Passed - Valid encounter within last 6 months    Recent Outpatient Visits           1 month ago Bilateral hip pain   [facility] Mail, DO   2 months ago Acute cystitis without hematuria   [facility] Berniece Salines, [name]   3 months ago AKI (acute kidney injury) [facility])   [place] [name], [name]   9 months ago Uncontrolled type 2 diabetes mellitus with hyperglycemia [facility])   [facility] [name], [name]   10 months ago Uncontrolled type 2 diabetes mellitus [facility])   [place] [facility] [name], [name]               ezetimibe (ZETIA) 10 MG tablet [Pharmacy Med Name: EZETIMIBE 10 MG TABLET] 90 tablet 3    Sig: TAKE 1 TABLET BY MOUTH EVERY DAY     Cardiovascular:  Antilipid - Sterol Transport Inhibitors Failed - 10/25/2022  5:08 PM      Failed - Lipid Panel in normal range within the last 12 months    Cholesterol, Total  Date Value Ref Range Status  05/08/2015 167 100 - 199 mg/dL Final   Cholesterol  Date Value Ref Range Status  06/02/2021 128 <200 mg/dL Final   LDL Cholesterol (Calc)  Date Value Ref Range Status  06/02/2021 67 mg/dL (calc) Final    Comment:    Reference range: <100 . Desirable range <100 mg/dL for primary prevention;   <70 mg/dL for patients with CHD or diabetic patients  with > or = 2 CHD risk factors. [name] LDL-C is now calculated using the Martin-Hopkins  calculation, which is a validated novel method providing  better accuracy than the Friedewald equation in the  estimation of LDL-C.  Horald Pollen et al. Lenox Ahr. 1601;093(23): 2061-2068  (http://education.[employer].com/faq/FAQ164)    HDL  Date Value Ref Range Status  06/02/2021 41 (L) > OR = 50 mg/dL Final  55/73/2202 48 >54 mg/dL Final   Triglycerides  Date Value Ref Range Status  06/19/2021 71 <150 mg/dL Final    Comment:    Performed at [facility], [address] [place], [place] [address]         Passed - AST in normal range and within 360 days    AST  Date Value Ref Range Status  08/03/2022 16 15 - 41 U/L Final         Passed - ALT in normal  range and within 360 days    ALT  Date Value Ref Range Status  08/03/2022 27 0 - 44 U/L Final         Passed - Patient is not pregnant      Passed - Valid encounter within last 12 months    Recent Outpatient Visits           1 month ago Bilateral hip pain   [facility] Margarita Mail, DO   2 months ago Acute cystitis without hematuria   [facility] Della Goo F, [name]   3 months ago AKI (acute kidney injury) [facility])   [place] [facility] [name], [name]   9 months ago Uncontrolled type 2 diabetes mellitus with hyperglycemia [name])   [place] [facility] [name], [name]   10 months ago Uncontrolled type 2 diabetes mellitus with hyperglycemia [facility])   [facility] [name], [place]

## 2022-10-26 NOTE — Telephone Encounter (Signed)
Rx 08/25/22 #90 3rF Requested Prescriptions  Pending Prescriptions Disp Refills   ezetimibe (ZETIA) 10 MG tablet [Pharmacy Med Name: EZETIMIBE 10 MG TABLET] 90 tablet 3    Sig: TAKE 1 TABLET BY MOUTH EVERY DAY     Cardiovascular:  Antilipid - Sterol Transport Inhibitors Failed - 10/25/2022  5:08 PM      Failed - Lipid Panel in normal range within the last 12 months    Cholesterol, Total  Date Value Ref Range Status  05/08/2015 167 100 - 199 mg/dL Final   Cholesterol  Date Value Ref Range Status  06/02/2021 128 <200 mg/dL Final   LDL Cholesterol (Calc)  Date Value Ref Range Status  06/02/2021 67 mg/dL (calc) Final    Comment:    Reference range: <100 . Desirable range <100 mg/dL for primary prevention;   <70 mg/dL for patients with CHD or diabetic patients  with > or = 2 CHD risk factors. [name] LDL-C is now calculated using the Martin-Hopkins  calculation, which is a validated novel method providing  better accuracy than the Friedewald equation in the  estimation of LDL-C.  Horald Pollen et al. Lenox Ahr. 1610;960(45): 2061-2068  (http://education.[facility].com/faq/FAQ164)    HDL  Date Value Ref Range Status  06/02/2021 41 (L) > OR = 50 mg/dL Final  40/98/1191 48 >47 mg/dL Final   Triglycerides  Date Value Ref Range Status  06/19/2021 71 <150 mg/dL Final    Comment:    Performed at [facility], [address] [place], [place] [address]         Passed - AST in normal range and within 360 days    AST  Date Value Ref Range Status  08/03/2022 16 15 - 41 U/L Final         Passed - ALT in normal range and within 360 days    ALT  Date Value Ref Range Status  08/03/2022 27 0 - 44 U/L Final         Passed - Patient is not pregnant      Passed - Valid encounter within last 12 months    Recent Outpatient Visits           1 month ago Bilateral hip pain   [facility] Margarita Mail, DO   2 months ago Acute cystitis without  hematuria   [facility] Della Goo F, [name]   3 months ago AKI (acute kidney injury) [facility])   [place] [facility] [name], [name]   9 months ago Uncontrolled type 2 diabetes mellitus with hyperglycemia [facility])   [place] [facility] [name], [name]   10 months ago Uncontrolled type 2 diabetes mellitus with hyperglycemia [facility])    [facility], Sheliah Mends, [place]              Signed Prescriptions Disp Refills   DULoxetine (CYMBALTA) 60 MG capsule 90 capsule 0    Sig: TAKE 1 CAPSULE BY MOUTH EVERY DAY     Psychiatry: Antidepressants - SNRI - duloxetine Passed - 10/25/2022  5:08 PM      Passed - Cr in normal range and within 360 days    Creat  Date Value Ref Range Status  01/23/2022 0.95 0.50 - 1.03 mg/dL Final   Creatinine, Ser  Date Value Ref Range Status  08/10/2022 0.88 0.44 - 1.00 mg/dL Final   Creatinine,U  Date Value Ref Range Status  04/16/2022 125.4 mg/dL Final  Creatinine, Urine  Date Value Ref Range Status  11/17/2021 25 20 - 275 mg/dL Final         Passed - eGFR is 30 or above and within 360 days    GFR, Est African American  Date Value Ref Range Status  11/28/2018 94 > OR = 60 mL/min/1.7m2 Final   GFR calc Af Amer  Date Value Ref Range Status  05/30/2019 >60 >60 mL/min Final   GFR, Est Non African American  Date Value Ref Range Status  11/28/2018 81 > OR = 60 mL/min/1.44m2 Final   GFR, Estimated  Date Value Ref Range Status  08/10/2022 >60 >60 mL/min Final    Comment:    (NOTE) Calculated using the CKD-EPI Creatinine Equation (2021) Performed at [facility], [address] [place], [place] [address]    GFR  Date Value Ref Range Status  04/14/2022 74.92 >60.00 mL/min Final    Comment:    Calculated using the CKD-EPI Creatinine Equation (2021)   eGFR  Date Value Ref Range Status  01/23/2022 70 > OR = 60 mL/min/1.65m2 Final          Passed - Completed PHQ-2 or PHQ-9 in the last 360 days      Passed - Last BP in normal range    BP Readings from Last 1 Encounters:  09/11/22 120/65         Passed - Valid encounter within last 6 months    Recent Outpatient Visits           1 month ago Bilateral hip pain   [facility] Margarita Mail, DO   2 months ago Acute cystitis without hematuria   [facility] Berniece Salines, [name]   3 months ago AKI (acute kidney injury) [facility])   [facility] [name], [name]   9 months ago Uncontrolled type 2 diabetes mellitus with hyperglycemia [facility])   [place] [name], [name]   10 months ago Uncontrolled type 2 diabetes mellitus with hyperglycemia [facility])   [facility] [name], [place]

## 2022-10-27 ENCOUNTER — Ambulatory Visit
Payer: 59 | Attending: Student in an Organized Health Care Education/Training Program | Admitting: Student in an Organized Health Care Education/Training Program

## 2022-10-27 ENCOUNTER — Encounter: Payer: Self-pay | Admitting: Student in an Organized Health Care Education/Training Program

## 2022-10-27 ENCOUNTER — Encounter: Payer: 59 | Admitting: Student in an Organized Health Care Education/Training Program

## 2022-10-27 VITALS — BP 134/81 | HR 77 | Temp 97.3°F | Ht 66.0 in | Wt 240.0 lb

## 2022-10-27 DIAGNOSIS — G894 Chronic pain syndrome: Secondary | ICD-10-CM

## 2022-10-27 DIAGNOSIS — G890 Central pain syndrome: Secondary | ICD-10-CM | POA: Diagnosis not present

## 2022-10-27 DIAGNOSIS — G43E01 Chronic migraine with aura, not intractable, with status migrainosus: Secondary | ICD-10-CM

## 2022-10-27 DIAGNOSIS — E1142 Type 2 diabetes mellitus with diabetic polyneuropathy: Secondary | ICD-10-CM | POA: Diagnosis not present

## 2022-10-27 DIAGNOSIS — Z794 Long term (current) use of insulin: Secondary | ICD-10-CM

## 2022-10-27 DIAGNOSIS — E1140 Type 2 diabetes mellitus with diabetic neuropathy, unspecified: Secondary | ICD-10-CM

## 2022-10-27 MED ORDER — BUPRENORPHINE 7.5 MCG/HR TD PTWK: 1.0000 | MEDICATED_PATCH | TRANSDERMAL | 2 refills | Status: DC

## 2022-10-27 MED ORDER — UBRELVY 50 MG PO TABS
ORAL_TABLET | ORAL | 2 refills | Status: DC
Start: 2022-10-27 — End: 2023-02-18

## 2022-10-27 NOTE — Progress Notes (Signed)
Safety precautions to be maintained throughout the outpatient stay will include: orient to surroundings, keep bed in low position, maintain call bell within reach at all times, provide assistance with transfer out of bed and ambulation.   Patient did not bring medication with her today. Instructed to always bring even if box is empty. Patient has patch on left chest.

## 2022-10-27 NOTE — Progress Notes (Signed)
PROVIDER NOTE: Information contained herein reflects review and annotations entered in association with encounter. Interpretation of such information and data should be left to medically-trained personnel. Information provided to patient can be located elsewhere in the medical record under "Patient Instructions". Document created using STT-dictation technology, any transcriptional errors that may result from process are unintentional.    Patient: [name]  Service Category: E/M  Provider: Edward Jolly, MD  DOB: [birth year]  DOS: 10/27/2022  Referring Provider: Sander Radon  MRN: [id number]  Specialty: Interventional Pain Management  PCP: [name], [name]  Type: Established Patient  Setting: Ambulatory outpatient    Location: Office  Delivery: Face-to-face     HPI  [name], a 57 y.o. year old female, is here today because of her Chronic pain syndrome [G89.4]. [name] primary complain today is Back Pain  Pertinent problems: [name] has Obesity, Class III, BMI 40-49.9 (morbid obesity) (HCC); Dyslipidemia associated with type 2 diabetes mellitus (HCC); Chronic pain syndrome; Diabetic polyneuropathy associated with type 2 diabetes mellitus (HCC); and CAD (coronary artery disease) on their pertinent problem list. Pain Assessment: Severity of Chronic pain is reported as a 8 /10. Location: Back Lower/radiates down side of left leg to foot. Onset: More than a month ago. Quality: Constant, Sharp, Shooting. Timing: Constant. Modifying factor(s): warm hoter with green alcohol, medications. Vitals:  height is 5\' 6"  (1.676 m) and weight is 240 lb (108.9 kg). Her temperature is 97.3 F (36.3 C) (abnormal). Her blood pressure is 134/81 and her pulse is 77.  BMI: Estimated body mass index is 38.74 kg/m as calculated from the following:   Height as of this encounter: 5\' 6"  (1.676 m).   Weight as of this encounter: 240 lb (108.9 kg). Last encounter: 09/08/2022. Last  procedure: 02/25/2022.  Reason for encounter: medication management.   Patient endorses significant pain relief and states that she is sleeping much better and waking up rested after initiation of Butrans.  No side effects noted.  She is pleased with the pain relief that she is experiencing. She also notes benefit with Ubrelvy in regards to her migraines.  She states that she has less than 15 migraine days per month and when she does take her Bernita Raisin it is helpful.  Pharmacotherapy Assessment  Analgesic: Butrans patch 7.5 mcg an hour  Monitoring: [place] PMP: PDMP reviewed during this encounter.       Pharmacotherapy: No side-effects or adverse reactions reported. Compliance: No problems identified. Effectiveness: Clinically acceptable.  Karen Pies, RN  10/27/2022 12:54 PM  Sign when Signing Visit Safety precautions to be maintained throughout the outpatient stay will include: orient to surroundings, keep bed in low position, maintain call bell within reach at all times, provide assistance with transfer out of bed and ambulation.   Patient did not bring medication with her today. Instructed to always bring even if box is empty. Patient has patch on left chest.  No results found for: "CBDTHCR" No results found for: "D8THCCBX" No results found for: "D9THCCBX"  UDS:  Summary  Date Value Ref Range Status  12/06/2018 Note  Final    Comment:    ==================================================================== Compliance Drug Analysis, Ur ==================================================================== Test                             Result       Flag       Units Drug Present and Declared for Prescription Verification  Oxycodone                      62           EXPECTED   ng/mg creat   Oxymorphone                    73           EXPECTED   ng/mg creat   Noroxycodone                   322          EXPECTED   ng/mg creat   Noroxymorphone                 43           EXPECTED    ng/mg creat    Sources of oxycodone are scheduled prescription medications.    Oxymorphone, noroxycodone, and noroxymorphone are expected    metabolites of oxycodone. Oxymorphone is also available as a    scheduled prescription medication.   Gabapentin                     PRESENT      EXPECTED   Levetiracetam                  PRESENT      EXPECTED   Baclofen                       PRESENT      EXPECTED   Acetaminophen                  PRESENT      EXPECTED Drug Present not Declared for Prescription Verification   Morphine                       227          UNEXPECTED ng/mg creat    Potential sources of morphine include administration of codeine or    morphine, use of heroin, or ingestion of poppy seeds. Drug Absent but Declared for Prescription Verification   Butalbital                     Not Detected UNEXPECTED   Duloxetine                     Not Detected UNEXPECTED   Prochlorperazine               Not Detected UNEXPECTED ==================================================================== Test                      Result    Flag   Units      Ref Range   Creatinine              157              mg/dL      >=78 ==================================================================== Declared Medications:  The flagging and interpretation on this report are based on the  following declared medications.  Unexpected results may arise from  inaccuracies in the declared medications.  **Note: The testing scope of this panel includes these medications:  Baclofen  Butalbital (Fioricet)  Duloxetine (Cymbalta)  Gabapentin (Neurontin)  Levetiracetam (Keppra)  Oxycodone (Percocet)  Prochlorperazine (Compazine)  **Note: The testing scope of this panel does not include small to  moderate amounts of these reported medications:  Acetaminophen (Fioricet)  Acetaminophen (Percocet)  **Note: The testing scope of this panel does not include the  following reported medications:  Albuterol (Ventolin  HFA)  Amlodipine  Caffeine (Fioricet)  Calcium  Cimetidine (Tagamet)  Clopidogrel (Plavix)  Cyclosporine (Restasis)  Dulaglutide  Ezetimibe (Zetia)  Fluorometholone  Fluticasone (Advair)  Indeterminate Medication  Insulin (NovoLog)  Losartan (Cozaar)  Mirabegron (Myrbetriq)  Ondansetron (Zofran)  Pantoprazole (Protonix)  Polyethylene Glycol (MiraLAX)  Potassium (Klor-Con)  Prednisone (Deltasone)  Salmeterol (Advair)  Simvastatin (Zocor)  Sucralfate (Carafate)  Sulfamethoxazole (Bactrim)  Terconazole  Trimethoprim (Bactrim)  Vitamin D ==================================================================== For clinical consultation, please call [phone]. ====================================================================       ROS  Constitutional: Denies any fever or chills Gastrointestinal: No reported hemesis, hematochezia, vomiting, or acute GI distress Musculoskeletal: Denies any acute onset joint swelling, redness, loss of ROM, or weakness Neurological: No reported episodes of acute onset apraxia, aphasia, dysarthria, agnosia, amnesia, paralysis, loss of coordination, or loss of consciousness  Medication Review  BLOOD GLUCOSE TEST STRIPS, Blood Glucose Monitoring Suppl, DULoxetine, Dexcom G7 Receiver, Dexcom G7 Sensor, EPINEPHrine, FreeStyle Libre 3 Plus Sensor, FreeStyle Libre 3 Sensor, Glucagon, Lancet Device, Lancets, Ubrogepant, albuterol, amLODipine, amitriptyline, blood glucose meter kit and supplies, buprenorphine, butalbital-acetaminophen-caffeine, carvedilol, cholecalciferol, clopidogrel, cycloSPORINE, ezetimibe, fluticasone-salmeterol, furosemide, gabapentin, hydrALAZINE, insulin glargine (2 Unit Dial), insulin lispro, levETIRAcetam, meclizine, mirabegron ER, pantoprazole, polyethylene glycol, rosuvastatin, senna-docusate, and tiZANidine  History Review  Allergy: Ms. Alvino is allergic to codeine, farxiga [dapagliflozin], and lactose intolerance  (gi). Drug: Ms. Radovic  reports no history of drug use. Alcohol:  reports current alcohol use of about 1.0 standard drink of alcohol per week. Tobacco:  reports that she quit smoking about 35 years ago. Her smoking use included cigarettes. She started smoking about 50 years ago. She has a 15 pack-year smoking history. She has never used smokeless tobacco. Social: Ms. Lineback  reports that she quit smoking about 35 years ago. Her smoking use included cigarettes. She started smoking about 50 years ago. She has a 15 pack-year smoking history. She has never used smokeless tobacco. She reports current alcohol use of about 1.0 standard drink of alcohol per week. She reports that she does not use drugs. Medical:  has a past medical history of Anxiety and depression (09/13/2006), Arthritis, Asthma, COPD (chronic obstructive pulmonary disease) (HCC), Depression, Diabetes mellitus, Diabetic neuropathy, painful (HCC), Diabetic retinopathy (HCC), Diastolic dysfunction, Dilated aortic root (HCC), Dizziness, Epilepsy idiopathic petit mal (HCC), GERD (gastroesophageal reflux disease), Glaucoma, Headache(784.0), Heart murmur, History of stress test, blood clots, Hyperlipidemia, Hypertension, Legally blind, MIGRAINE HEADACHE (09/14/2006), Myocardial infarction [facility]), Nephrolithiasis, Pain (09/23/2011), Pneumonia, Pyelonephritis, SBO (small bowel obstruction) (HCC) (04/23/2021), Seizures (HCC), Sleep apnea, Small bowel obstruction (HCC) (05/18/2021), Stress incontinence, and Stroke (HCC). Surgical: Ms. Lerma  has a past surgical history that includes kidney stone removal; Brain hematoma evacuation; Shunt removal; Retinal detachment surgery (Left, 1990); Vaginal hysterectomy (1996); Incisional hernia repair (05/05/2011); Mass excision (05/05/2011); PCNL; Cystoscopy/retrograde/ureteroscopy (08/24/2011); Cystoscopy with ureteroscopy (09/24/2011); Cystoscopy w/ retrogrades (09/24/2011); RIGHT/LEFT HEART CATH AND CORONARY ANGIOGRAPHY (N/A,  04/28/2019); and laparotomy (N/A, 06/12/2021). Family: family history includes ADD / ADHD in her maternal grandfather; Birth defects in her daughter and maternal grandfather; Brain cancer in her maternal grandmother; Cancer in her mother; Cirrhosis in her maternal grandfather; Diabetes in her maternal grandfather; Hypertension in her daughter, maternal grandmother, and mother; Uterine cancer in her mother.  Laboratory Chemistry Profile   Renal Lab Results  Component Value Date  BUN 24 (H) 08/09/2022   CREATININE 0.88 08/10/2022   LABCREA 25 11/17/2021   BCR SEE NOTE: 01/23/2022   GFR 74.92 04/14/2022   GFRAA >60 05/30/2019   GFRNONAA >60 08/10/2022    Hepatic Lab Results  Component Value Date   AST 16 08/03/2022   ALT 27 08/03/2022   ALBUMIN 3.8 08/03/2022   ALKPHOS 99 08/03/2022   LIPASE 37 07/07/2021    Electrolytes Lab Results  Component Value Date   NA 134 (L) 08/09/2022   K 4.6 08/09/2022   CL 107 08/09/2022   CALCIUM 8.6 (L) 08/09/2022   MG 2.1 10/18/2021   PHOS 4.7 (H) 07/07/2021    Bone Lab Results  Component Value Date   VD25OH 33 11/17/2021    Inflammation (CRP: Acute Phase) (ESR: Chronic Phase) Lab Results  Component Value Date   CRP 16.7 (H) 06/14/2021   ESRSEDRATE 65 (H) 06/14/2021   LATICACIDVEN 1.6 08/03/2022         Note: Above Lab results reviewed.  Recent Imaging Review  CT ABDOMEN PELVIS W CONTRAST CLINICAL DATA:  Concern for bowel obstruction.  EXAM: CT ABDOMEN AND PELVIS WITH CONTRAST  TECHNIQUE: Multidetector CT imaging of the abdomen and pelvis was performed using the standard protocol following bolus administration of intravenous contrast.  RADIATION DOSE REDUCTION: This exam was performed according to the departmental dose-optimization program which includes automated exposure control, adjustment of the mA and/or kV according to patient size and/or use of iterative reconstruction technique.  CONTRAST:  OMNIPAQUE IOHEXOL  350 MG/ML SOLN  COMPARISON:  CT abdomen pelvis dated 10/15/2021.  FINDINGS: Lower chest: Trace bilateral pleural effusions with minimal bibasilar subpleural atelectasis.  No intra-abdominal free air or free fluid.  Hepatobiliary: The liver is unremarkable. No biliary dilatation. The gallbladder is unremarkable.  Pancreas: Unremarkable. No pancreatic ductal dilatation or surrounding inflammatory changes.  Spleen: Normal in size without focal abnormality.  Adrenals/Urinary Tract: The left adrenal gland is unremarkable. Stable 2.5 cm right adrenal adenoma. Small nonobstructing bilateral renal calculi measure up to 6 mm in the inferior pole of the right kidney. There is no hydronephrosis on either side. There is symmetric enhancement and excretion of contrast by both kidneys. The visualized ureters and urinary bladder appear unremarkable.  Stomach/Bowel: There is sigmoid diverticulosis without active inflammatory changes. Multiple mildly dilated loops of proximal small bowel measure up to 4.5 cm no discrete transition noted. Findings favored to represent an ileus. Developing obstruction is not excluded. Small-bowel series may provide better evaluation. The appendix is normal.  Vascular/Lymphatic: The abdominal aorta and IVC are unremarkable. No portal venous gas. There is no adenopathy.  Reproductive: Hysterectomy.  No adnexal masses.  Other: Midline vertical anterior pelvic wall incisional scar. Small fat containing umbilical hernia.  Musculoskeletal: Degenerative changes of the spine. No acute osseous pathology.  IMPRESSION: 1. Mildly dilated proximal small bowel favored to represent an ileus. Developing obstruction is not excluded. Small-bowel series may provide better evaluation. 2. Sigmoid diverticulosis. 3. Nonobstructing bilateral renal calculi. No hydronephrosis.  Electronically Signed   By: Elgie Collard M.D.   On: 08/08/2022 21:55 DG Abd 1 View CLINICAL  DATA:  Abdominal pain and constipation, initial encounter  EXAM: ABDOMEN - 1 VIEW  COMPARISON:  10/18/2021  FINDINGS: Scattered large and small bowel gas is noted. A few mildly dilated loops of small bowel are noted in the left mid abdomen. This may represent a partial small bowel obstruction or ileus. CT may be helpful for further evaluation. No free  air is noted. No bony abnormality is seen.  IMPRESSION: Mildly dilated loops of small bowel on the left suspicious for ileus or early small bowel obstruction. CT may be helpful as clinically indicated.  Electronically Signed   By: Alcide Clever M.D.   On: 08/08/2022 20:24 Note: Reviewed        Physical Exam  General appearance: Well nourished, well developed, and well hydrated. In no apparent acute distress Mental status: Alert, oriented x 3 (person, place, & time)       Respiratory: No evidence of acute respiratory distress Eyes: PERLA Vitals: BP 134/81   Pulse 77   Temp (!) 97.3 F (36.3 C)   Ht 5\' 6"  (1.676 m)   Wt 240 lb (108.9 kg)   LMP  (LMP Unknown)   BMI 38.74 kg/m  BMI: Estimated body mass index is 38.74 kg/m as calculated from the following:   Height as of this encounter: 5\' 6"  (1.676 m).   Weight as of this encounter: 240 lb (108.9 kg). Ideal: Ideal body weight: 59.3 kg (130 lb 11.7 oz) Adjusted ideal body weight: 79.1 kg (174 lb 7 oz)  Assessment   Diagnosis Status  1. Chronic pain syndrome   2. Chronic migraine with aura and with status migrainosus, not intractable   3. Diabetic polyneuropathy associated with type 2 diabetes mellitus (HCC)   4. Chronic painful diabetic neuropathy (HCC)   5. Central pain syndrome    Controlled Controlled Controlled     Plan of Care  Problem-specific:  No problem-specific Assessment & Plan notes found for this encounter.  Ms. Keiaira Shalom has a current medication list which includes the following long-term medication(s): albuterol, albuterol,  amitriptyline, amlodipine, duloxetine, ezetimibe, furosemide, gabapentin, toujeo max solostar, insulin lispro, levetiracetam, mirabegron er, pantoprazole, rosuvastatin, and wixela inhub.  Pharmacotherapy (Medications Ordered): Meds ordered this encounter  Medications   buprenorphine (BUTRANS) 7.5 MCG/HR    Sig: Place 1 patch onto the skin once a week.    Dispense:  4 patch    Refill:  2    Chronic Pain: STOP Act (Not applicable) Fill 1 day early if closed on refill date. Avoid benzodiazepines within 8 hours of opioids   Ubrogepant (UBRELVY) 50 MG TABS    Sig: 50 mg or 100 mg taken orally with or without food. If needed, a second dose may be taken at least 2 hours after the initial dose. The maximum dose in a 24-hour period is 200 mg    Dispense:  60 tablet    Refill:  2   Orders:  No orders of the defined types were placed in this encounter.  Follow-up plan:   Return in about 17 weeks (around 02/23/2023) for MM, F2F.      Recent Visits Date Type Provider Dept  09/08/22 Office Visit Edward Jolly, MD [facility]  Showing recent visits within past 90 days and meeting all other requirements Today's Visits Date Type Provider Dept  10/27/22 Office Visit Edward Jolly, MD [facility]  Showing today's visits and meeting all other requirements Future Appointments Date Type Provider Dept  11/30/22 Appointment Edward Jolly, MD [facility]  Showing future appointments within next 90 days and meeting all other requirements  I discussed the assessment and treatment plan with the patient. The patient was provided an opportunity to ask questions and all were answered. The patient agreed with the plan and demonstrated an understanding of the instructions.  Patient advised to call back or seek  an in-person evaluation if the symptoms or condition worsens.  Duration of encounter: .  Total time on encounter, as per AMA guidelines included both the  face-to-face and non-face-to-face time personally spent by the physician and/or other qualified health care professional(s) on the day of the encounter (includes time in activities that require the physician or other qualified health care professional and does not include time in activities normally performed by clinical staff). Physician's time may include the following activities when performed: Preparing to see the patient (e.g., pre-charting review of records, searching for previously ordered imaging, lab work, and nerve conduction tests) Review of prior analgesic pharmacotherapies. Reviewing PMP Interpreting ordered tests (e.g., lab work, imaging, nerve conduction tests) Performing post-procedure evaluations, including interpretation of diagnostic procedures Obtaining and/or reviewing separately obtained history Performing a medically appropriate examination and/or evaluation Counseling and educating the patient/family/caregiver Ordering medications, tests, or procedures Referring and communicating with other health care professionals (when not separately reported) Documenting clinical information in the electronic or other health record Independently interpreting results (not separately reported) and communicating results to the patient/ family/caregiver Care coordination (not separately reported)  Note by: Edward Jolly, MD Date: 10/27/2022; Time: 1:48 PM

## 2022-10-30 ENCOUNTER — Ambulatory Visit: Payer: 59 | Admitting: "Endocrinology

## 2022-11-01 ENCOUNTER — Other Ambulatory Visit: Payer: Self-pay | Admitting: Obstetrics and Gynecology

## 2022-11-01 DIAGNOSIS — J449 Chronic obstructive pulmonary disease, unspecified: Secondary | ICD-10-CM | POA: Diagnosis not present

## 2022-11-01 DIAGNOSIS — G4733 Obstructive sleep apnea (adult) (pediatric): Secondary | ICD-10-CM | POA: Diagnosis not present

## 2022-11-01 DIAGNOSIS — I10 Essential (primary) hypertension: Secondary | ICD-10-CM | POA: Diagnosis not present

## 2022-11-01 DIAGNOSIS — Z8673 Personal history of transient ischemic attack (TIA), and cerebral infarction without residual deficits: Secondary | ICD-10-CM | POA: Diagnosis not present

## 2022-11-02 ENCOUNTER — Encounter: Payer: Self-pay | Admitting: Family Medicine

## 2022-11-02 MED ORDER — VITAMIN D 25 MCG (1000 UNIT) PO TABS: 1000.0000 [IU] | ORAL_TABLET | Freq: Every day | ORAL | 1 refills | Status: AC

## 2022-11-05 ENCOUNTER — Ambulatory Visit: Payer: Self-pay | Admitting: *Deleted

## 2022-11-05 NOTE — Patient Outreach (Signed)
Care Coordination   Follow Up Visit Note   11/05/2022 Name: [name] MRN: [id number] DOB: Jul 03, 1965  [name] is a 57 y.o. year old female who sees [name], [place] for primary care. I spoke with  [name] by phone today.  What matters to the patients health and wellness today?  Report she is doing well, denies any further nausea as she complained of last outreach.  Working now to obtain [employer] and transportation resources.     Goals Addressed             This Visit's Progress    Management of chronic medical conditions (DM, HTN, CHF)        Interventions Today    Flowsheet Row Most Recent Value  Chronic Disease   Chronic disease during today's visit Diabetes, Congestive Heart Failure (CHF), Atrial Fibrillation (AFib)  General Interventions   General Interventions Discussed/Reviewed General Interventions Reviewed, Doctor Visits, Vaccines, Communication with  Vaccines COVID-19, Flu  Doctor Visits Discussed/Reviewed Doctor Visits Reviewed, Specialist  [neurology on 11/11]  PCP/Specialist Visits Compliance with follow-up visit  Communication with Pharmacists, Social Work  [Contacted [facility] to follow up on patient's report of needing refills, none needed at this time.  Collaborated with SW regarding transportation resources and Medicaid qualifications]  Exercise Interventions   Exercise Discussed/Reviewed Weight Managment  Weight Management Weight maintenance  Education Interventions   Education Provided Provided Education  Provided Verbal Education On Nutrition, Blood Sugar Monitoring, Medication, When to see the doctor, [facility], [employer] that per SW, Medicaid limit for single person younger than 53 is $1730.  Advised to apply for Medicaid again, also advised to look into transportation resources provided by SW]                SDOH assessments and interventions completed:  No     Care Coordination  Interventions:  Yes, provided   Follow up plan: Follow up call scheduled for 11/12    Encounter Outcome:  Patient Visit Completed   Kemper Durie RN, MSN, CCM [place]  [facility], [facility] RN Care Coordinator Direct Dial: [phone] / Main [phone] Fax [phone] Email: [email or web address] Website: [email or web address]

## 2022-11-19 ENCOUNTER — Other Ambulatory Visit: Payer: Self-pay | Admitting: "Endocrinology

## 2022-11-19 DIAGNOSIS — E1165 Type 2 diabetes mellitus with hyperglycemia: Secondary | ICD-10-CM

## 2022-11-25 ENCOUNTER — Encounter: Payer: Self-pay | Admitting: Family Medicine

## 2022-11-30 ENCOUNTER — Ambulatory Visit: Payer: 59 | Admitting: Student in an Organized Health Care Education/Training Program

## 2022-11-30 ENCOUNTER — Ambulatory Visit: Payer: 59 | Admitting: "Endocrinology

## 2022-12-01 ENCOUNTER — Ambulatory Visit: Payer: Self-pay | Admitting: *Deleted

## 2022-12-01 NOTE — Patient Outreach (Signed)
  Care Coordination   Follow Up Visit Note   12/01/2022 Name: Rachelle Morr MRN: [id number] DOB: [birth year]  Annas Sadoski is a 57 y.o. year old female who sees [name], [place] for primary care. I spoke with  [name] by phone today.  What matters to the patients health and wellness today?  Working to get in touch with her case worker to apply for full medicaid benefits.  She will be losing some of her transportation resources with Medicare in January, trying to be proactive.     Goals Addressed             This Visit's Progress    Management of chronic medical conditions (DM, HTN, CHF)   On track         Interventions Today    Flowsheet Row Most Recent Value  Chronic Disease   Chronic disease during today's visit Diabetes, Hypertension (HTN), Congestive Heart Failure (CHF)  General Interventions   General Interventions Discussed/Reviewed General Interventions Reviewed, Doctor Visits, Labs, Vaccines, Durable Medical Equipment (DME)  Labs Hgb A1c every 3 months  [due for updated A1C, last one in July 9.7]  Vaccines Flu  Doctor Visits Discussed/Reviewed Doctor Visits Reviewed, Specialist, PCP  [facility] endocrinology appt due to transportation, rescheduled for 12/12, cardiology 12/2]  Durable Medical Equipment (DME) BP Cuff, Other  [has new BP monitor and scale for daily monitoring]  PCP/Specialist Visits Compliance with follow-up visit  Exercise Interventions   Exercise Discussed/Reviewed Weight Managment  Weight Management Weight maintenance  [weight today 236 pounds]  Education Interventions   Education Provided Provided Education  Provided Verbal Education On Blood Sugar Monitoring, Medication, When to see the doctor, Labs, Applications, [employer] she has been trying to contact her DSS worker to apply for full Medicaid benefits, no success.  RNCM will collaborate with SW team on other options.  she continues to work on  alternate transportation options]  Labs Reviewed Hgb A1c              SDOH assessments and interventions completed:  No     Care Coordination Interventions:  Yes, provided   Follow up plan: Follow up call scheduled for 12/16    Encounter Outcome:  Patient Visit Completed   Kemper Durie RN, MSN, CCM [place]  [facility], [facility] RN Care Coordinator Direct Dial: [phone] / Main [phone] Fax [phone] Email: [email or web address] Website: [email or web address]

## 2022-12-07 ENCOUNTER — Telehealth: Payer: Self-pay

## 2022-12-07 NOTE — Patient Outreach (Signed)
Attempted to contact patient regarding A1C, MM. Left voicemail for patient to return my call at [phone].  Nicholes Rough, CMA Care Guide [employer]

## 2022-12-09 DIAGNOSIS — G4733 Obstructive sleep apnea (adult) (pediatric): Secondary | ICD-10-CM | POA: Diagnosis not present

## 2022-12-09 DIAGNOSIS — I10 Essential (primary) hypertension: Secondary | ICD-10-CM | POA: Diagnosis not present

## 2022-12-09 DIAGNOSIS — Z8673 Personal history of transient ischemic attack (TIA), and cerebral infarction without residual deficits: Secondary | ICD-10-CM | POA: Diagnosis not present

## 2022-12-09 DIAGNOSIS — J449 Chronic obstructive pulmonary disease, unspecified: Secondary | ICD-10-CM | POA: Diagnosis not present

## 2022-12-13 ENCOUNTER — Other Ambulatory Visit: Payer: Self-pay | Admitting: Internal Medicine

## 2022-12-13 DIAGNOSIS — M25551 Pain in right hip: Secondary | ICD-10-CM

## 2022-12-15 NOTE — Telephone Encounter (Signed)
Requested medication (s) are due for refill today - unsure  Requested medication (s) are on the active medication list -yes  Future visit scheduled -no  Last refill: 09/01/22 #30  Notes to clinic: non delegated Rx  Requested Prescriptions  Pending Prescriptions Disp Refills   tiZANidine (ZANAFLEX) 4 MG tablet [Pharmacy Med Name: TIZANIDINE HCL 4 MG TABLET] 30 tablet 0    Sig: TAKE 1 TABLET (4 MG TOTAL) BY MOUTH EVERY DAY AT BEDTIME AS NEEDED     Not Delegated - Cardiovascular:  Alpha-2 Agonists - tizanidine Failed - 12/13/2022 12:23 PM      Failed - This refill cannot be delegated      Passed - Valid encounter within last 6 months    Recent Outpatient Visits           3 months ago Bilateral hip pain   [facility] Margarita Mail, DO   3 months ago Acute cystitis without hematuria   [facility] Della Goo F, [name]   5 months ago AKI (acute kidney injury) [facility])   [place] [facility] [name], [name]   10 months ago Uncontrolled type 2 diabetes mellitus with hyperglycemia [facility])   [place] [facility] [name], [name]   1 year ago Uncontrolled type 2 diabetes mellitus with hyperglycemia [facility])   [place] [facility] [name], [name]       Future Appointments             In 2 weeks Altamese [place], MD [facility] Endocrinology               Requested Prescriptions  Pending Prescriptions Disp Refills   tiZANidine (ZANAFLEX) 4 MG tablet [Pharmacy Med Name: TIZANIDINE HCL 4 MG TABLET] 30 tablet 0    Sig: TAKE 1 TABLET (4 MG TOTAL) BY MOUTH EVERY DAY AT BEDTIME AS NEEDED     Not Delegated - Cardiovascular:  Alpha-2 Agonists - tizanidine Failed - 12/13/2022 12:23 PM      Failed - This refill cannot be delegated      Passed - Valid encounter within last 6 months    Recent Outpatient Visits           3 months ago Bilateral hip pain   [facility] Margarita Mail, DO   3 months ago Acute cystitis without hematuria   [facility] Della Goo F, [name]   5 months ago AKI (acute kidney injury) [facility])   [place] [facility] [name], [name]   10 months ago Uncontrolled type 2 diabetes mellitus with hyperglycemia [facility])   [facility] [name], [name]   1 year ago Uncontrolled type 2 diabetes mellitus with hyperglycemia [facility])    [facility] [name], [name]       Future Appointments             In 2 weeks Altamese [place], MD [facility] Endocrinology

## 2022-12-22 ENCOUNTER — Other Ambulatory Visit: Payer: Self-pay | Admitting: "Endocrinology

## 2022-12-22 DIAGNOSIS — E1165 Type 2 diabetes mellitus with hyperglycemia: Secondary | ICD-10-CM

## 2022-12-30 ENCOUNTER — Other Ambulatory Visit: Payer: Self-pay

## 2022-12-30 ENCOUNTER — Other Ambulatory Visit: Payer: Self-pay | Admitting: Nurse Practitioner

## 2022-12-30 ENCOUNTER — Other Ambulatory Visit: Payer: Self-pay | Admitting: Internal Medicine

## 2022-12-30 ENCOUNTER — Other Ambulatory Visit: Payer: Self-pay | Admitting: Family Medicine

## 2022-12-30 ENCOUNTER — Other Ambulatory Visit: Payer: Self-pay | Admitting: Student in an Organized Health Care Education/Training Program

## 2022-12-30 ENCOUNTER — Encounter: Payer: Self-pay | Admitting: "Endocrinology

## 2022-12-30 DIAGNOSIS — E1142 Type 2 diabetes mellitus with diabetic polyneuropathy: Secondary | ICD-10-CM

## 2022-12-30 DIAGNOSIS — F330 Major depressive disorder, recurrent, mild: Secondary | ICD-10-CM

## 2022-12-30 DIAGNOSIS — E1165 Type 2 diabetes mellitus with hyperglycemia: Secondary | ICD-10-CM

## 2022-12-30 DIAGNOSIS — E119 Type 2 diabetes mellitus without complications: Secondary | ICD-10-CM

## 2022-12-30 DIAGNOSIS — G894 Chronic pain syndrome: Secondary | ICD-10-CM

## 2022-12-30 DIAGNOSIS — G890 Central pain syndrome: Secondary | ICD-10-CM

## 2022-12-30 DIAGNOSIS — M25552 Pain in left hip: Secondary | ICD-10-CM

## 2022-12-30 MED ORDER — FREESTYLE LIBRE 3 PLUS SENSOR MISC: 1.0000 | 3 refills | Status: DC

## 2022-12-31 ENCOUNTER — Telehealth (INDEPENDENT_AMBULATORY_CARE_PROVIDER_SITE_OTHER): Payer: 59 | Admitting: "Endocrinology

## 2022-12-31 ENCOUNTER — Encounter: Payer: Self-pay | Admitting: "Endocrinology

## 2022-12-31 DIAGNOSIS — E7800 Pure hypercholesterolemia, unspecified: Secondary | ICD-10-CM

## 2022-12-31 DIAGNOSIS — E1165 Type 2 diabetes mellitus with hyperglycemia: Secondary | ICD-10-CM | POA: Diagnosis not present

## 2022-12-31 DIAGNOSIS — Z794 Long term (current) use of insulin: Secondary | ICD-10-CM

## 2022-12-31 MED ORDER — TRULICITY 0.75 MG/0.5ML ~~LOC~~ SOAJ: 0.7500 mg | SUBCUTANEOUS | 0 refills | Status: DC

## 2022-12-31 MED ORDER — EPINEPHRINE 0.3 MG/0.3ML IJ SOAJ: 0.3000 mg | INTRAMUSCULAR | 0 refills | Status: DC | PRN

## 2022-12-31 MED ORDER — ALBUTEROL SULFATE HFA 108 (90 BASE) MCG/ACT IN AERS: 1.0000 | INHALATION_SPRAY | Freq: Four times a day (QID) | RESPIRATORY_TRACT | 0 refills | Status: DC | PRN

## 2022-12-31 MED ORDER — ALBUTEROL SULFATE 1.25 MG/3ML IN NEBU: 1.0000 | INHALATION_SOLUTION | Freq: Two times a day (BID) | RESPIRATORY_TRACT | 0 refills | Status: DC

## 2022-12-31 NOTE — Progress Notes (Signed)
The patient reports they are currently: [place]. This was a video visit with the patient on the date of service. I spent an additional 10 minutes on pre- and post-visit activities on the date of service.   The patient was physically located in [place] or a state in which I am permitted to provide care. The patient and/or parent/guardian understood that s/he may incur co-pays and cost sharing, and agreed to the telemedicine visit. The visit was reasonable and appropriate under the circumstances given the patient's presentation at the time.  The patient and/or parent/guardian has been advised of the potential risks and limitations of this mode of treatment (including, but not limited to, the absence of in-person examination) and has agreed to be treated using telemedicine. The patient's/patient's family's questions regarding telemedicine have been answered.   The patient and/or parent/guardian has also been advised to contact their provider's office for worsening conditions, and seek emergency medical treatment and/or call 911 if the patient deems either necessary.    Outpatient Endocrinology Note Altamese La [place], MD  12/31/22   [name] [facility] [place] 28-Jun-1965 [id number]  Referring Provider: [name], [name] Primary Care Provider: [name], [name] Reason for consultation: Subjective   Assessment & Plan  Diagnoses and all orders for this visit:  Uncontrolled type 2 diabetes mellitus with hyperglycemia, with long-term current use of insulin (HCC)  Long-term insulin use (HCC)  Pure hypercholesterolemia  Other orders -     Dulaglutide (TRULICITY) 0.75 MG/0.5ML SOAJ; Inject 0.75 mg into the skin once a week.    Diabetes Type II complicated by CAD, stroke  Lab Results  Component Value Date   GFR 74.92 04/14/2022   Hba1c goal less than 7, current Hba1c is  Lab Results  Component Value Date   HGBA1C 9.7 (H) 07/20/2022   Will recommend the following: Toujeo 22 units daily  in a.m. Humalog: 8 units for breakfast, 12 units for lunch and 8 units for dinner, 15 min before meals  Libre 3 is covered, Dexcom not covered Resume Trulicity 0.75mg /week, stopped 4.5 mg due to diarrhea and bloating, will avoid high doses   Stopped Trulicity 4.5 mg weekly  Toujeo dose changed from 70 to 20 units recently during hospital visit and pt was stopped on Trulicity 4.5 mg weekly due to diarrhea and bloating   No known contraindications/side effects to any of above medications No history of MEN syndrome/medullary thyroid cancer/pancreatitis or pancreatic cancer in self or family  -Last LD and Tg are as follows: Lab Results  Component Value Date   LDLCALC 67 06/02/2021    Lab Results  Component Value Date   TRIG 71 06/19/2021   -On rosuvastatin 10 mg every day,  zetia 10 mg every day  -start rosuvastatin 40 mg every day on 09/11/22, (if tolerated, patient will stop zetia 10 mg every day)  -Follow low fat diet and exercise   -Blood pressure goal <140/90 - Microalbumin/creatinine goal is < 30 -Last MA/Cr is as follows: Lab Results  Component Value Date   MICROALBUR 3.6 (H) 04/16/2022   -not on ACE/ARB  -diet changes including salt restriction -limit eating outside -counseled BP targets per standards of diabetes care -uncontrolled blood pressure can lead to retinopathy, nephropathy and cardiovascular and atherosclerotic heart disease  Reviewed and counseled on: -A1C target -Blood sugar targets -Complications of uncontrolled diabetes  -Checking blood sugar before meals and bedtime and bring log next visit -All medications with mechanism of action and side effects -Hypoglycemia management: rule of 15's, Glucagon  Emergency Kit and medical alert ID -low-carb low-fat plate-method diet -At least 20 minutes of physical activity per day -Annual dilated retinal eye exam and foot exam -compliance and follow up needs -follow up as scheduled or earlier if problem gets  worse  Call if blood sugar is less than 70 or consistently above 250    Take a 15 gm snack of carbohydrate at bedtime before you go to sleep if your blood sugar is less than 100.    If you are going to fast after midnight for a test or procedure, ask your physician for instructions on how to reduce/decrease your insulin dose.    Call if blood sugar is less than 70 or consistently above 250  -Treating a low sugar by rule of 15  (15 gms of sugar every 15 min until sugar is more than 70) If you feel your sugar is low, test your sugar to be sure If your sugar is low (less than 70), then take 15 grams of a fast acting Carbohydrate (3-4 glucose tablets or glucose gel or 4 ounces of juice or regular soda) Recheck your sugar 15 min after treating low to make sure it is more than 70 If sugar is still less than 70, treat again with 15 grams of carbohydrate          Don't drive the hour of hypoglycemia  If unconscious/unable to eat or drink by mouth, use glucagon injection or nasal spray baqsimi and call 911. Can repeat again in 15 min if still unconscious.  Return in about 4 weeks (around 01/28/2023).   I have reviewed current medications, nurse's notes, allergies, vital signs, past medical and surgical history, family medical history, and social history for this encounter. Counseled patient on symptoms, examination findings, lab findings, imaging results, treatment decisions and monitoring and prognosis. The patient understood the recommendations and agrees with the treatment plan. All questions regarding treatment plan were fully answered.  Altamese Parks, MD  12/31/22   History of Present Illness Marilu Kathry Hilgeman is a 57 y.o. year old female who presents for follow up of Type II diabetes mellitus.  Siearra Lighter was first diagnosed in 1989.   Diabetes education +  Home diabetes regimen: Toujeo 22 units daily in a.m. Humalog: 8 units for breakfast, 12 units for lunch and 8 units  for dinner, 15 min before meals   Stopped Trulicity 4.5 mg weekly  Background history:   She thinks she was initially treated with diet alone and subsequently given metformin With metformin she had nausea and diarrhea but she does not know if she was treated with any other medications until she was started on insulin in 2003.  Also was on glipizide in 2015 Most likely she was on Levemir or Lantus insulin for quite some time She was then switched to Toujeo in 2016 and although she was initially prescribed 90 units twice a day this was subsequently lowered  Her A1c has been occasionally between 7.2 and 7.5 but has been consistently over 9% since 2018  PREVIOUS OMNIPOD PUMP settings with Humalog U-200:  Basal settings: 12 AM = 2.6, 3 AM = 2.4, 9 AM = 2.7, 3 PM = 3.0 Sensitivity 1: 60 and target 140  Max bolus 6 units Active insulin time 6 hours  COMPLICATIONS +  MI/Stroke -  retinopathy -  neuropathy -  nephropathy  BLOOD SUGAR DATA Unable to [employer] data  Physical Exam  LMP  (LMP Unknown)    Constitutional:  well developed, well nourished Head: normocephalic, atraumatic Eyes: sclera anicteric, no redness Neck: supple Lungs: normal respiratory effort Neurology: alert and oriented Skin: dry, no appreciable rashes Musculoskeletal: no appreciable defects Psychiatric: normal mood and affect Diabetic Foot Exam - Simple   No data filed      Current Medications Patient's Medications  New Prescriptions   DULAGLUTIDE (TRULICITY) 0.75 MG/0.5ML SOAJ    Inject 0.75 mg into the skin once a week.  Previous Medications   ALBUTEROL (ACCUNEB) 1.25 MG/3ML NEBULIZER SOLUTION    Take 3 mLs (1.25 mg total) by nebulization 2 (two) times daily.   ALBUTEROL (VENTOLIN HFA) 108 (90 BASE) MCG/ACT INHALER    Inhale 1-2 puffs into the lungs every 6 (six) hours as needed.   AMITRIPTYLINE (ELAVIL) 25 MG TABLET    Take 2-3 tablets (50-75 mg total) by mouth at bedtime.   AMLODIPINE (NORVASC)  10 MG TABLET    TAKE 1 TABLET BY MOUTH DAILY   BLOOD GLUCOSE METER KIT AND SUPPLIES KIT    Dispense based on patient and insurance preference. Use up to four times daily as directed. (FOR E11.65 Z79.4).   BLOOD GLUCOSE MONITORING SUPPL DEVI    1 each by Does not apply route in the morning, at noon, and at bedtime. May substitute to any manufacturer covered by patient's insurance.   BUPRENORPHINE (BUTRANS) 7.5 MCG/HR    Place 1 patch onto the skin once a week.   BUTALBITAL-ACETAMINOPHEN-CAFFEINE (FIORICET) 50-325-40 MG TABLET    Take 1-2 tablets by mouth every 6 (six) hours as needed for headache.   CARVEDILOL (COREG) 25 MG TABLET    Take 25 mg by mouth 2 (two) times daily.   CHOLECALCIFEROL (VITAMIN D3) 25 MCG (1000 UNIT) TABLET    Take 1 tablet (1,000 Units total) by mouth daily.   CLOPIDOGREL (PLAVIX) 75 MG TABLET    Take 1 tablet (75 mg total) by mouth daily.   CONTINUOUS GLUCOSE RECEIVER (DEXCOM G7 RECEIVER) DEVI    1 Device by Does not apply route continuous.   CONTINUOUS GLUCOSE SENSOR (DEXCOM G7 SENSOR) MISC    1 Device by Does not apply route continuous.   CONTINUOUS GLUCOSE SENSOR (FREESTYLE LIBRE 3 PLUS SENSOR) MISC    Inject 1 Device into the skin continuous. Change every 15 days   CONTINUOUS GLUCOSE SENSOR (FREESTYLE LIBRE 3 SENSOR) MISC    1 Device by Does not apply route continuous.   CYCLOSPORINE (RESTASIS) 0.05 % OPHTHALMIC EMULSION    Place 2 drops into both eyes 2 (two) times daily.   DULOXETINE (CYMBALTA) 60 MG CAPSULE    TAKE 1 CAPSULE BY MOUTH EVERY DAY   EPINEPHRINE 0.3 MG/0.3 ML IJ SOAJ INJECTION    Inject 0.3 mg into the muscle as needed for anaphylaxis.   EZETIMIBE (ZETIA) 10 MG TABLET    Take 1 tablet (10 mg total) by mouth daily.   FUROSEMIDE (LASIX) 20 MG TABLET    TAKE 1 TABLET BY MOUTH DAILY   GABAPENTIN (NEURONTIN) 600 MG TABLET    600 mg qAM, 600 mg qPM, 1200 mg QHS   GLUCAGON (GVOKE HYPOPEN 2-PACK) 1 MG/0.2ML SOAJ    INJECT 1 MG INTO THE SKIN ONCE AS NEEDED FOR UP  TO 1 DOSE (HYPOGLYCEMIA). ONLY FOR SEVERE LOW BLOOD SUGAR NOT RESPONDING TO DRINKING JUICE/GLUCOSE TABLETS, WHEN CBG IS <70   GLUCOSE BLOOD (BLOOD GLUCOSE TEST STRIPS) STRP    Use as directed to monitor blood sugar up to 4x a day FOR E11.65  Z79.4   GLUCOSE BLOOD (BLOOD GLUCOSE TEST STRIPS) STRP    1 each by In Vitro route in the morning, at noon, and at bedtime. Needing Accu Check Guide Strips   HYDRALAZINE (APRESOLINE) 50 MG TABLET    Take 1 tablet by mouth 3 (three) times daily.   INSULIN GLARGINE, 2 UNIT DIAL, (TOUJEO MAX SOLOSTAR) 300 UNIT/ML SOLOSTAR PEN    20 units in am   INSULIN LISPRO (HUMALOG KWIKPEN) 100 UNIT/ML KWIKPEN    INJECT SUBCUTANEOUSLY UP TO 7  UNITS BEFORE EACH MEAL   LANCETS MISC    Use as directed to monitor blood sugar up to 4x a day FOR E11.65 Z79.4   LEVETIRACETAM (KEPPRA) 500 MG TABLET    Take 500-750 mg by mouth See admin instructions. Take 1 tablet (500mg ) by mouth every morning and take 1 tablets by mouth (750mg ) by mouth every night   MECLIZINE (ANTIVERT) 25 MG TABLET    Take 1 tablet (25 mg total) by mouth 3 (three) times daily as needed for dizziness.   MIRABEGRON ER (MYRBETRIQ) 50 MG TB24 TABLET    Take 1 tablet (50 mg total) by mouth daily.   PANTOPRAZOLE (PROTONIX) 40 MG TABLET    Take 1 tablet (40 mg total) by mouth daily.   POLYETHYLENE GLYCOL (MIRALAX / GLYCOLAX) 17 G PACKET    Take 17 g by mouth daily as needed for mild constipation.   ROSUVASTATIN (CRESTOR) 40 MG TABLET    TAKE 1 TABLET BY MOUTH DAILY   SENNA-DOCUSATE (SENOKOT-S) 8.6-50 MG TABLET    Take 1 tablet by mouth at bedtime as needed for mild constipation.   TIZANIDINE (ZANAFLEX) 4 MG TABLET    Take 1 tablet (4 mg total) by mouth at bedtime as needed.   UBROGEPANT (UBRELVY) 50 MG TABS    50 mg or 100 mg taken orally with or without food. If needed, a second dose may be taken at least 2 hours after the initial dose. The maximum dose in a 24-hour period is 200 mg   WIXELA INHUB 250-50 MCG/DOSE AEPB     Inhale 1 puff into the lungs 2 (two) times daily.  Modified Medications   No medications on file  Discontinued Medications   No medications on file    Allergies Allergies  Allergen Reactions   Codeine Swelling and Other (See Comments)    Swelling and burning of mouth (inside)   [place] [Dapagliflozin] Other (See Comments)    Recurrent UTI with farxiga   Lactose Intolerance (Gi) Nausea And Vomiting    Past Medical History Past Medical History:  Diagnosis Date   Anxiety and depression 09/13/2006   Qualifier: Diagnosis of  By: Daphine Deutscher [name], [place]     Arthritis    joint pain    Asthma    COPD (chronic obstructive pulmonary disease) (HCC)    chronic bronchitis    Depression    Diabetes mellitus    since age 59; type 2 IDDM   Diabetic neuropathy, painful (HCC)    FEET AND HANDS   Diabetic retinopathy (HCC)    Diastolic dysfunction    a.  Echo 11/17: EF 60-65%, mild LVH, no RWMA, Gr1DD, mild AI, dilated aortic root measuring 38 mm, mildly dilated ascending aorta   Dilated aortic root (HCC)    38mm by echo 11/2015   Dizziness    secondary to diabetes and hypertension    Epilepsy idiopathic petit mal (HCC)    last seizure 2012;controlled w/ topomax  GERD (gastroesophageal reflux disease)    Glaucoma    NOT ON ANY EYE DROPS    Headache(784.0)    migraines    Heart murmur    born with    History of stress test    a. 11/17: Normal perfusion, EF 53%, normal study   Hx of blood clots    hematomas removed from left side of brain from 11mos to 57yrs old    Hyperlipidemia    Hypertension    Legally blind    left eye    MIGRAINE HEADACHE 09/14/2006   Qualifier: Diagnosis of  By: Daphine Deutscher [name], Nykedtra     Myocardial infarction [facility])    a.  Patient reported history of without objective documentation   Nephrolithiasis    frequent urination , urination at nite  PT SEEN IN ER 09/22/11 FOR BACK AND RT SIDED PAIN--HAS KNOWN STONE RT URETER AND UA IN ER SHOWED UTI   Pain 09/23/2011    LOWER BACK AND RIGHT SIDE--PT HAS RIGHT URETERAL STONE   Pneumonia    hx of 2009   Pyelonephritis    SBO (small bowel obstruction) (HCC) 04/23/2021   Seizures (HCC)    Sleep apnea    [facility];does not use Cpap ; mild   Small bowel obstruction (HCC) 05/18/2021   Stress incontinence    Stroke (HCC)    last 2003  RESIDUAL LEFT LEG WEAKNESS--NO OTHER RESIDUAL PROBLEMS    Past Surgical History Past Surgical History:  Procedure Laterality Date   BRAIN HEMATOMA EVACUATION     five procedures total, first procedure when 20 months old, last at 57 years of age.   CYSTOSCOPY W/ RETROGRADES  09/24/2011   Procedure: CYSTOSCOPY WITH RETROGRADE PYELOGRAM;  Surgeon: Anner Crete, MD;  Location: [facility];  Service: Urology;  Laterality: Right;   CYSTOSCOPY WITH URETEROSCOPY  09/24/2011   Procedure: CYSTOSCOPY WITH URETEROSCOPY;  Surgeon: Anner Crete, MD;  Location: [facility];  Service: Urology;  Laterality: Right;  Balloon dilation right ureter    CYSTOSCOPY/RETROGRADE/URETEROSCOPY  08/24/2011   Procedure: CYSTOSCOPY/RETROGRADE/URETEROSCOPY;  Surgeon: Milford Cage, MD;  Location: [facility];  Service: Urology;  Laterality: Right;  Cysto, Right retrograde Pyelogram, right stent placement.    INCISIONAL HERNIA REPAIR  05/05/2011   Procedure: LAPAROSCOPIC INCISIONAL HERNIA;  Surgeon: Emelia Loron, MD;  Location: [facility] OR;  Service: General;  Laterality: N/A;   kidney stone removal     LAPAROTOMY N/A 06/12/2021   Procedure: EXPLORATORY LAPAROTOMY, LYSIS OF ADHESIONS;  Surgeon: Henrene Dodge, MD;  Location: [facility] ORS;  Service: General;  Laterality: N/A;   MASS EXCISION  05/05/2011   Procedure: EXCISION MASS;  Surgeon: Emelia Loron, MD;  Location: [facility] OR;  Service: General;  Laterality: Right;   [facility]     RETINAL DETACHMENT SURGERY Left 1990   RIGHT/LEFT HEART CATH AND CORONARY ANGIOGRAPHY N/A 04/28/2019   Procedure: RIGHT/LEFT HEART CATH AND CORONARY ANGIOGRAPHY;  Surgeon: Alwyn Pea, MD;  Location: [facility] INVASIVE CV LAB;  Service: Cardiovascular;  Laterality: N/A;   SHUNT REMOVAL     shunt inserted at age 45 removed at age 28    VAGINAL HYSTERECTOMY  51    Family History family history includes ADD / ADHD in her maternal grandfather; Birth defects in her daughter and maternal grandfather; Brain cancer in her maternal grandmother; Cancer in her mother; Cirrhosis in her maternal grandfather; Diabetes in her maternal grandfather; Hypertension in her daughter, maternal grandmother, and mother; Uterine cancer  in her mother.  Social History Social History   Socioeconomic History   Marital status: Widowed    Spouse name: Not on file   Number of children: 2   Years of education: Not on file   Highest education level: 12th grade  Occupational History   Occupation: disabled  Tobacco Use   Smoking status: Former    Current packs/day: 0.00    Average packs/day: 1 pack/day for 15.0 years (15.0 ttl pk-yrs)    Types: Cigarettes    Start date: 01/20/1972    Quit date: 01/20/1987    Years since quitting: 35.9   Smokeless tobacco: Never   Tobacco comments:    quit more than 20 years  Vaping Use   Vaping status: Never Used  Substance and Sexual Activity   Alcohol use: Yes    Alcohol/week: 1.0 standard drink of alcohol    Types: 1 Glasses of wine per week   Drug use: No    Comment: hx of marijuana use no longer uses    Sexual activity: Yes    Partners: Male    Birth control/protection: Surgical  Other Topics Concern   Not on file  Social History Narrative   Pt lives with her daughter   Social Drivers of Health   Financial Resource Strain: Medium Risk (06/28/2022)   Overall Financial Resource Strain (CARDIA)    Difficulty of Paying Living Expenses: Somewhat hard  Food Insecurity: No Food Insecurity (08/13/2022)   Hunger Vital Sign    Worried About Running Out of Food in the Last Year: Never true    Ran Out of Food in the Last Year: Never true  Recent  Concern: Food Insecurity - Food Insecurity Present (06/28/2022)   Hunger Vital Sign    Worried About Running Out of Food in the Last Year: Often true    Ran Out of Food in the Last Year: Often true  Transportation Needs: No Transportation Needs (08/13/2022)   PRAPARE - Administrator, Civil Service (Medical): No    Lack of Transportation (Non-Medical): No  Physical Activity: Insufficiently Active (06/28/2022)   Exercise Vital Sign    Days of Exercise per Week: 1 day    Minutes of Exercise per Session: 10 min  Stress: No Stress Concern Present (06/28/2022)   [employer] of Occupational Health - Occupational Stress Questionnaire    Feeling of Stress : Not at all  Social Connections: Moderately Integrated (06/28/2022)   Social Connection and Isolation Panel [NHANES]    Frequency of Communication with Friends and Family: Twice a week    Frequency of Social Gatherings with Friends and Family: More than three times a week    Attends Religious Services: 1 to 4 times per year    Active Member of [employer] or Organizations: Yes    Attends Banker Meetings: Never    Marital Status: Widowed  Intimate Partner Violence: Not At Risk (08/03/2022)   Humiliation, Afraid, Rape, and Kick questionnaire    Fear of Current or Ex-Partner: No    Emotionally Abused: No    Physically Abused: No    Sexually Abused: No    Lab Results  Component Value Date   HGBA1C 9.7 (H) 07/20/2022   HGBA1C 9.8 (A) 04/16/2022   HGBA1C 12.0 (H) 01/23/2022   Lab Results  Component Value Date   CHOL 128 06/02/2021   Lab Results  Component Value Date   HDL 41 (L) 06/02/2021   Lab Results  Component Value Date  LDLCALC 67 06/02/2021   Lab Results  Component Value Date   TRIG 71 06/19/2021   Lab Results  Component Value Date   CHOLHDL 3.1 06/02/2021   Lab Results  Component Value Date   CREATININE 0.88 08/10/2022   Lab Results  Component Value Date   GFR 74.92 04/14/2022   Lab Results   Component Value Date   MICROALBUR 3.6 (H) 04/16/2022      Component Value Date/Time   NA 134 (L) 08/09/2022 0534   NA 138 05/08/2015 1106   K 4.6 08/09/2022 0534   CL 107 08/09/2022 0534   CO2 21 (L) 08/09/2022 0534   GLUCOSE 114 (H) 08/09/2022 0534   BUN 24 (H) 08/09/2022 0534   BUN 14 05/08/2015 1106   CREATININE 0.88 08/10/2022 0459   CREATININE 0.95 01/23/2022 1157   CALCIUM 8.6 (L) 08/09/2022 0534   PROT 7.7 08/03/2022 1406   PROT 7.7 05/08/2015 1106   ALBUMIN 3.8 08/03/2022 1406   ALBUMIN 4.1 05/08/2015 1106   AST 16 08/03/2022 1406   ALT 27 08/03/2022 1406   ALKPHOS 99 08/03/2022 1406   BILITOT 0.5 08/03/2022 1406   BILITOT 0.3 05/08/2015 1106   GFRNONAA >60 08/10/2022 0459   GFRNONAA 81 11/28/2018 0000   GFRAA >60 05/30/2019 1543   GFRAA 94 11/28/2018 0000      Latest Ref Rng & Units 08/10/2022    4:59 AM 08/09/2022    5:34 AM 08/08/2022    8:57 AM  BMP  Glucose 70 - 99 mg/dL  161  096   BUN 6 - 20 mg/dL  24  35   Creatinine 0.45 - 1.00 mg/dL 4.09  8.11  9.14   Sodium 135 - 145 mmol/L  134  136   Potassium 3.5 - 5.1 mmol/L  4.6  4.4   Chloride 98 - 111 mmol/L  107  105   CO2 22 - 32 mmol/L  21  25   Calcium 8.9 - 10.3 mg/dL  8.6  9.0        Component Value Date/Time   WBC 7.5 08/09/2022 0534   RBC 4.02 08/09/2022 0534   HGB 11.8 (L) 08/09/2022 0534   HGB 13.1 05/08/2015 1106   HCT 37.3 08/09/2022 0534   HCT 39.5 05/08/2015 1106   PLT 214 08/09/2022 0534   PLT 274 05/08/2015 1106   MCV 92.8 08/09/2022 0534   MCV 91 05/08/2015 1106   MCH 29.4 08/09/2022 0534   MCHC 31.6 08/09/2022 0534   RDW 14.0 08/09/2022 0534   RDW 14.1 05/08/2015 1106   LYMPHSABS 1.7 08/03/2022 1406   LYMPHSABS 1.7 05/08/2015 1106   MONOABS 0.5 08/03/2022 1406   EOSABS 0.0 08/03/2022 1406   EOSABS 0.0 05/08/2015 1106   BASOSABS 0.0 08/03/2022 1406   BASOSABS 0.0 05/08/2015 1106     Parts of this note may have been dictated using voice recognition software. There may be  variances in spelling and vocabulary which are unintentional. Not all errors are proofread. Please notify the Thereasa Parkin if any discrepancies are noted or if the meaning of any statement is not clear.

## 2023-01-03 ENCOUNTER — Encounter: Payer: Self-pay | Admitting: Student in an Organized Health Care Education/Training Program

## 2023-01-04 ENCOUNTER — Ambulatory Visit: Payer: Self-pay | Admitting: *Deleted

## 2023-01-04 ENCOUNTER — Other Ambulatory Visit: Payer: Self-pay

## 2023-01-04 NOTE — Patient Outreach (Signed)
  Care Coordination   Follow Up Visit Note   01/04/2023 Name: [name] MRN: [id number] DOB: July 19, 1965  [name] is a 57 y.o. year old female who sees [name], [place] for primary care. I spoke with  [name] by phone today.  What matters to the patients health and wellness today?  Report she was having issues with her Toujeo pens, reported to endocrinology, they have reordered and she is waiting for delivery.  Denies any urgent concerns, encouraged to contact this care manager with questions.      Goals Addressed             This Visit's Progress    Management of chronic medical conditions (DM, HTN, CHF)   On track         Interventions Today    Flowsheet Row Most Recent Value  Chronic Disease   Chronic disease during today's visit Diabetes, Hypertension (HTN), Congestive Heart Failure (CHF)  General Interventions   General Interventions Discussed/Reviewed General Interventions Reviewed, Doctor Visits, Labs, Vaccines, Durable Medical Equipment (DME)  Labs Hgb A1c every 3 months  [due for updated A1C, last one in July 9.7]  Vaccines Flu  Doctor Visits Discussed/Reviewed Doctor Visits Reviewed, Specialist, PCP  [facility] endocrinology appt due to transportation, rescheduled for 12/12, cardiology 12/2]  Durable Medical Equipment (DME) BP Cuff, Other  [has new BP monitor and scale for daily monitoring]  PCP/Specialist Visits Compliance with follow-up visit  Exercise Interventions   Exercise Discussed/Reviewed Weight Managment  Weight Management Weight maintenance  [weight today 236 pounds]  Education Interventions   Education Provided Provided Education  Provided Verbal Education On Blood Sugar Monitoring, Medication, When to see the doctor, Labs, Applications, [employer] she has been trying to contact her DSS worker to apply for full Medicaid benefits, no success.  RNCM will collaborate with SW team on other options.  she  continues to work on alternate transportation options]  Labs Reviewed Hgb A1c              SDOH assessments and interventions completed:  No     Care Coordination Interventions:  Yes, provided   Follow up plan: Follow up call scheduled for 2/3    Encounter Outcome:  Patient Visit Completed   Rodney Langton, RN, MSN, CCM [place]  [facility], [facility] RN Care Coordinator Direct Dial: [phone] / Main [phone] Fax [phone] Email: [email or web address]: [email or web address]

## 2023-01-06 ENCOUNTER — Encounter: Payer: Self-pay | Admitting: "Endocrinology

## 2023-01-08 ENCOUNTER — Encounter: Payer: Self-pay | Admitting: "Endocrinology

## 2023-01-08 DIAGNOSIS — I10 Essential (primary) hypertension: Secondary | ICD-10-CM | POA: Diagnosis not present

## 2023-01-08 DIAGNOSIS — G4733 Obstructive sleep apnea (adult) (pediatric): Secondary | ICD-10-CM | POA: Diagnosis not present

## 2023-01-08 DIAGNOSIS — Z8673 Personal history of transient ischemic attack (TIA), and cerebral infarction without residual deficits: Secondary | ICD-10-CM | POA: Diagnosis not present

## 2023-01-08 DIAGNOSIS — J449 Chronic obstructive pulmonary disease, unspecified: Secondary | ICD-10-CM | POA: Diagnosis not present

## 2023-01-09 ENCOUNTER — Other Ambulatory Visit: Payer: Self-pay

## 2023-01-09 DIAGNOSIS — Z794 Long term (current) use of insulin: Secondary | ICD-10-CM

## 2023-01-09 DIAGNOSIS — E1165 Type 2 diabetes mellitus with hyperglycemia: Secondary | ICD-10-CM

## 2023-01-09 MED ORDER — FREESTYLE LIBRE 3 PLUS SENSOR MISC: 1.0000 | 3 refills | Status: DC

## 2023-01-09 MED ORDER — TOUJEO MAX SOLOSTAR 300 UNIT/ML ~~LOC~~ SOPN: PEN_INJECTOR | SUBCUTANEOUS | 1 refills | Status: DC

## 2023-01-11 ENCOUNTER — Encounter: Payer: Self-pay | Admitting: "Endocrinology

## 2023-01-11 ENCOUNTER — Telehealth (INDEPENDENT_AMBULATORY_CARE_PROVIDER_SITE_OTHER): Payer: 59 | Admitting: "Endocrinology

## 2023-01-11 DIAGNOSIS — Z794 Long term (current) use of insulin: Secondary | ICD-10-CM

## 2023-01-11 DIAGNOSIS — E7800 Pure hypercholesterolemia, unspecified: Secondary | ICD-10-CM | POA: Diagnosis not present

## 2023-01-11 DIAGNOSIS — Z7985 Long-term (current) use of injectable non-insulin antidiabetic drugs: Secondary | ICD-10-CM | POA: Diagnosis not present

## 2023-01-11 DIAGNOSIS — E1165 Type 2 diabetes mellitus with hyperglycemia: Secondary | ICD-10-CM

## 2023-01-11 MED ORDER — FREESTYLE LIBRE 3 PLUS SENSOR MISC: 1.0000 | 3 refills | Status: DC

## 2023-01-11 MED ORDER — TRULICITY 1.5 MG/0.5ML ~~LOC~~ SOAJ: 1.5000 mg | SUBCUTANEOUS | 0 refills | Status: DC

## 2023-01-11 NOTE — Progress Notes (Signed)
The patient reports they are currently: [name]. This was a video visit for 8-9 min with the patient on the date of service. I spent an additional 10-15 minutes on pre- and post-visit activities on the date of service.   The patient was physically located in [place] or a state in which I am permitted to provide care. The patient and/or parent/guardian understood that s/he may incur co-pays and cost sharing, and agreed to the telemedicine visit. The visit was reasonable and appropriate under the circumstances given the patient's presentation at the time.  The patient and/or parent/guardian has been advised of the potential risks and limitations of this mode of treatment (including, but not limited to, the absence of in-person examination) and has agreed to be treated using telemedicine. The patient's/patient's family's questions regarding telemedicine have been answered.   The patient and/or parent/guardian has also been advised to contact their provider's office for worsening conditions, and seek emergency medical treatment and/or call 911 if the patient deems either necessary.    Outpatient Endocrinology Note Altamese Octa, MD  01/11/23   [name] 1965/12/23 [id number]  Referring Provider: [name], [name] Primary Care Provider: [name], [name] Reason for consultation: Subjective   Assessment & Plan  Diagnoses and all orders for this visit:  Uncontrolled type 2 diabetes mellitus with hyperglycemia, with long-term current use of insulin (HCC) -     Continuous Glucose Sensor (FREESTYLE LIBRE 3 PLUS SENSOR) MISC; Inject 1 Device into the skin continuous. Change every 14 days -     Dulaglutide (TRULICITY) 1.5 MG/0.5ML SOAJ; Inject 1.5 mg into the skin once a week.  Long-term (current) use of injectable non-insulin antidiabetic drugs  Long-term insulin use (HCC)  Pure hypercholesterolemia   Diabetes Type II complicated by CAD, stroke  Lab Results  Component  Value Date   GFR 74.92 04/14/2022   Hba1c goal less than 7, current Hba1c is  Lab Results  Component Value Date   HGBA1C 9.7 (H) 07/20/2022   Will recommend the following: Toujeo 26 units daily in a.m. Humalog: 8 units for breakfast, 12 units for lunch and 8 units for dinner, 15 min before meals  Correction scale: Use in addition to your meal time/short acting insulin based on blood sugars as follows:  151 - 190: 1 unit 191 - 230: 2 units 231 - 270: 3 units 271 - 310: 4 units 311 - 350: 5 units 351 - 390: 6 units 391 - 430: 7 units  Trulicity 1.5mg /week Libre 3 is covered, Dexcom not covered  Stopped Trulicity 4.5 mg weekly due to diarrhea and bloating, will avoid high doses  Toujeo dose changed from 70 to 20 units recently during hospital visit and pt was stopped on Trulicity 4.5 mg weekly due to diarrhea and bloating   No known contraindications/side effects to any of above medications No history of MEN syndrome/medullary thyroid cancer/pancreatitis or pancreatic cancer in self or family Has Glucagon on file   -Last LD and Tg are as follows: Lab Results  Component Value Date   LDLCALC 67 06/02/2021    Lab Results  Component Value Date   TRIG 71 06/19/2021   -On rosuvastatin 40 mg every day, zetia 10 mg every day  -Follow low fat diet and exercise   -Blood pressure goal <140/90 - Microalbumin/creatinine goal is < 30 -Last MA/Cr is as follows: Lab Results  Component Value Date   MICROALBUR 3.6 (H) 04/16/2022   -not on ACE/ARB  -diet changes including salt restriction -  limit eating outside -counseled BP targets per standards of diabetes care -uncontrolled blood pressure can lead to retinopathy, nephropathy and cardiovascular and atherosclerotic heart disease  Reviewed and counseled on: -A1C target -Blood sugar targets -Complications of uncontrolled diabetes  -Checking blood sugar before meals and bedtime and bring log next visit -All medications with  mechanism of action and side effects -Hypoglycemia management: rule of 15's, Glucagon Emergency Kit and medical alert ID -low-carb low-fat plate-method diet -At least 20 minutes of physical activity per day -Annual dilated retinal eye exam and foot exam -compliance and follow up needs -follow up as scheduled or earlier if problem gets worse  Call if blood sugar is less than 70 or consistently above 250    Take a 15 gm snack of carbohydrate at bedtime before you go to sleep if your blood sugar is less than 100.    If you are going to fast after midnight for a test or procedure, ask your physician for instructions on how to reduce/decrease your insulin dose.    Call if blood sugar is less than 70 or consistently above 250  -Treating a low sugar by rule of 15  (15 gms of sugar every 15 min until sugar is more than 70) If you feel your sugar is low, test your sugar to be sure If your sugar is low (less than 70), then take 15 grams of a fast acting Carbohydrate (3-4 glucose tablets or glucose gel or 4 ounces of juice or regular soda) Recheck your sugar 15 min after treating low to make sure it is more than 70 If sugar is still less than 70, treat again with 15 grams of carbohydrate          Don't drive the hour of hypoglycemia  If unconscious/unable to eat or drink by mouth, use glucagon injection or nasal spray baqsimi and call 911. Can repeat again in 15 min if still unconscious.  Return in about 4 weeks (around 02/08/2023).   I have reviewed current medications, nurse's notes, allergies, vital signs, past medical and surgical history, family medical history, and social history for this encounter. Counseled patient on symptoms, examination findings, lab findings, imaging results, treatment decisions and monitoring and prognosis. The patient understood the recommendations and agrees with the treatment plan. All questions regarding treatment plan were fully answered.  Altamese Lawnton, MD   01/11/23   History of Present Illness [name] is a 57 y.o. year old female who presents for follow up of Type II diabetes mellitus.  [name] was first diagnosed in 1989.   Diabetes education +  Home diabetes regimen: Toujeo 22 units daily in a.m. Humalog: 8 units for breakfast, 12 units for lunch and 8 units for dinner, 15 min before meals  Trulicity 0.75 mg weekly  Background history:   She thinks she was initially treated with diet alone and subsequently given metformin With metformin she had nausea and diarrhea but she does not know if she was treated with any other medications until she was started on insulin in 2003.  Also was on glipizide in 2015 Most likely she was on Levemir or Lantus insulin for quite some time She was then switched to Toujeo in 2016 and although she was initially prescribed 90 units twice a day this was subsequently lowered  Her A1c has been occasionally between 7.2 and 7.5 but has been consistently over 9% since 2018  PREVIOUS OMNIPOD PUMP settings with Humalog U-200:  Basal settings: 12 AM =  2.6, 3 AM = 2.4, 9 AM = 2.7, 3 PM = 3.0 Sensitivity 1: 60 and target 140  Max bolus 6 units Active insulin time 6 hours  COMPLICATIONS +  MI/Stroke -  retinopathy -  neuropathy -  nephropathy  BLOOD SUGAR DATA Checks tidac and at bedtime  194-400s  Physical Exam  LMP  (LMP Unknown)    Constitutional: well developed, well nourished Head: normocephalic, atraumatic Eyes: sclera anicteric, no redness Neck: supple Lungs: normal respiratory effort Neurology: alert and oriented Skin: dry, no appreciable rashes Musculoskeletal: no appreciable defects Psychiatric: normal mood and affect Diabetic Foot Exam - Simple   No data filed      Current Medications Patient's Medications  New Prescriptions   DULAGLUTIDE (TRULICITY) 1.5 MG/0.5ML SOAJ    Inject 1.5 mg into the skin once a week.  Previous Medications   ALBUTEROL  (ACCUNEB) 1.25 MG/3ML NEBULIZER SOLUTION    Take 3 mLs (1.25 mg total) by nebulization 2 (two) times daily.   ALBUTEROL (VENTOLIN HFA) 108 (90 BASE) MCG/ACT INHALER    Inhale 1-2 puffs into the lungs every 6 (six) hours as needed.   AMITRIPTYLINE (ELAVIL) 25 MG TABLET    Take 2-3 tablets (50-75 mg total) by mouth at bedtime.   AMLODIPINE (NORVASC) 10 MG TABLET    TAKE 1 TABLET BY MOUTH DAILY   BLOOD GLUCOSE METER KIT AND SUPPLIES KIT    Dispense based on patient and insurance preference. Use up to four times daily as directed. (FOR E11.65 Z79.4).   BLOOD GLUCOSE MONITORING SUPPL DEVI    1 each by Does not apply route in the morning, at noon, and at bedtime. May substitute to any manufacturer covered by patient's insurance.   BUPRENORPHINE (BUTRANS) 7.5 MCG/HR    Place 1 patch onto the skin once a week.   BUTALBITAL-ACETAMINOPHEN-CAFFEINE (FIORICET) 50-325-40 MG TABLET    Take 1-2 tablets by mouth every 6 (six) hours as needed for headache.   CARVEDILOL (COREG) 25 MG TABLET    Take 25 mg by mouth 2 (two) times daily.   CHOLECALCIFEROL (VITAMIN D3) 25 MCG (1000 UNIT) TABLET    Take 1 tablet (1,000 Units total) by mouth daily.   CLOPIDOGREL (PLAVIX) 75 MG TABLET    Take 1 tablet (75 mg total) by mouth daily.   CONTINUOUS GLUCOSE RECEIVER (DEXCOM G7 RECEIVER) DEVI    1 Device by Does not apply route continuous.   CONTINUOUS GLUCOSE SENSOR (DEXCOM G7 SENSOR) MISC    1 Device by Does not apply route continuous.   CONTINUOUS GLUCOSE SENSOR (FREESTYLE LIBRE 3 SENSOR) MISC    1 Device by Does not apply route continuous.   CYCLOSPORINE (RESTASIS) 0.05 % OPHTHALMIC EMULSION    Place 2 drops into both eyes 2 (two) times daily.   DULOXETINE (CYMBALTA) 60 MG CAPSULE    TAKE 1 CAPSULE BY MOUTH EVERY DAY   EPINEPHRINE 0.3 MG/0.3 ML IJ SOAJ INJECTION    Inject 0.3 mg into the muscle as needed for anaphylaxis.   EZETIMIBE (ZETIA) 10 MG TABLET    Take 1 tablet (10 mg total) by mouth daily.   FUROSEMIDE (LASIX) 20 MG  TABLET    TAKE 1 TABLET BY MOUTH DAILY   GABAPENTIN (NEURONTIN) 600 MG TABLET    600 mg qAM, 600 mg qPM, 1200 mg QHS   GLUCAGON (GVOKE HYPOPEN 2-PACK) 1 MG/0.2ML SOAJ    INJECT 1 MG INTO THE SKIN ONCE AS NEEDED FOR UP TO 1 DOSE (HYPOGLYCEMIA). ONLY  FOR SEVERE LOW BLOOD SUGAR NOT RESPONDING TO DRINKING JUICE/GLUCOSE TABLETS, WHEN CBG IS <70   GLUCOSE BLOOD (BLOOD GLUCOSE TEST STRIPS) STRP    Use as directed to monitor blood sugar up to 4x a day FOR E11.65 Z79.4   GLUCOSE BLOOD (BLOOD GLUCOSE TEST STRIPS) STRP    1 each by In Vitro route in the morning, at noon, and at bedtime. Needing Accu Check Guide Strips   HYDRALAZINE (APRESOLINE) 50 MG TABLET    Take 1 tablet by mouth 3 (three) times daily.   INSULIN GLARGINE, 2 UNIT DIAL, (TOUJEO MAX SOLOSTAR) 300 UNIT/ML SOLOSTAR PEN    20 units in am   INSULIN LISPRO (HUMALOG KWIKPEN) 100 UNIT/ML KWIKPEN    INJECT SUBCUTANEOUSLY UP TO 7  UNITS BEFORE EACH MEAL   LANCETS MISC    Use as directed to monitor blood sugar up to 4x a day FOR E11.65 Z79.4   LEVETIRACETAM (KEPPRA) 500 MG TABLET    Take 500-750 mg by mouth See admin instructions. Take 1 tablet (500mg ) by mouth every morning and take 1 tablets by mouth (750mg ) by mouth every night   MECLIZINE (ANTIVERT) 25 MG TABLET    Take 1 tablet (25 mg total) by mouth 3 (three) times daily as needed for dizziness.   MIRABEGRON ER (MYRBETRIQ) 50 MG TB24 TABLET    Take 1 tablet (50 mg total) by mouth daily.   PANTOPRAZOLE (PROTONIX) 40 MG TABLET    Take 1 tablet (40 mg total) by mouth daily.   POLYETHYLENE GLYCOL (MIRALAX / GLYCOLAX) 17 G PACKET    Take 17 g by mouth daily as needed for mild constipation.   ROSUVASTATIN (CRESTOR) 40 MG TABLET    TAKE 1 TABLET BY MOUTH DAILY   SENNA-DOCUSATE (SENOKOT-S) 8.6-50 MG TABLET    Take 1 tablet by mouth at bedtime as needed for mild constipation.   TIZANIDINE (ZANAFLEX) 4 MG TABLET    Take 1 tablet (4 mg total) by mouth at bedtime as needed.   UBROGEPANT (UBRELVY) 50 MG TABS     50 mg or 100 mg taken orally with or without food. If needed, a second dose may be taken at least 2 hours after the initial dose. The maximum dose in a 24-hour period is 200 mg   WIXELA INHUB 250-50 MCG/DOSE AEPB    Inhale 1 puff into the lungs 2 (two) times daily.  Modified Medications   Modified Medication Previous Medication   CONTINUOUS GLUCOSE SENSOR (FREESTYLE LIBRE 3 PLUS SENSOR) MISC Continuous Glucose Sensor (FREESTYLE LIBRE 3 PLUS SENSOR) MISC      Inject 1 Device into the skin continuous. Change every 14 days    Inject 1 Device into the skin continuous. Change every 15 days  Discontinued Medications   DULAGLUTIDE (TRULICITY) 0.75 MG/0.5ML SOAJ    Inject 0.75 mg into the skin once a week.    Allergies Allergies  Allergen Reactions   Codeine Swelling and Other (See Comments)    Swelling and burning of mouth (inside)   [place] [Dapagliflozin] Other (See Comments)    Recurrent UTI with farxiga   Lactose Intolerance (Gi) Nausea And Vomiting    Past Medical History Past Medical History:  Diagnosis Date   Anxiety and depression 09/13/2006   Qualifier: Diagnosis of  By: Daphine Deutscher [name], [place]     Arthritis    joint pain    Asthma    COPD (chronic obstructive pulmonary disease) (HCC)    chronic bronchitis    Depression  Diabetes mellitus    since age 6; type 2 IDDM   Diabetic neuropathy, painful (HCC)    FEET AND HANDS   Diabetic retinopathy (HCC)    Diastolic dysfunction    a.  Echo 11/17: EF 60-65%, mild LVH, no RWMA, Gr1DD, mild AI, dilated aortic root measuring 38 mm, mildly dilated ascending aorta   Dilated aortic root (HCC)    38mm by echo 11/2015   Dizziness    secondary to diabetes and hypertension    Epilepsy idiopathic petit mal (HCC)    last seizure 2012;controlled w/ topomax   GERD (gastroesophageal reflux disease)    Glaucoma    NOT ON ANY EYE DROPS    Headache(784.0)    migraines    Heart murmur    born with    History of stress test    a. 11/17:  Normal perfusion, EF 53%, normal study   Hx of blood clots    hematomas removed from left side of brain from 11mos to 57yrs old    Hyperlipidemia    Hypertension    Legally blind    left eye    MIGRAINE HEADACHE 09/14/2006   Qualifier: Diagnosis of  By: Daphine Deutscher [name], [place]     Myocardial infarction [facility])    a.  Patient reported history of without objective documentation   Nephrolithiasis    frequent urination , urination at nite  PT SEEN IN ER 09/22/11 FOR BACK AND RT SIDED PAIN--HAS KNOWN STONE RT URETER AND UA IN ER SHOWED UTI   Pain 09/23/2011   LOWER BACK AND RIGHT SIDE--PT HAS RIGHT URETERAL STONE   Pneumonia    hx of 2009   Pyelonephritis    SBO (small bowel obstruction) (HCC) 04/23/2021   Seizures (HCC)    Sleep apnea    [facility];does not use Cpap ; mild   Small bowel obstruction (HCC) 05/18/2021   Stress incontinence    Stroke (HCC)    last 2003  RESIDUAL LEFT LEG WEAKNESS--NO OTHER RESIDUAL PROBLEMS    Past Surgical History Past Surgical History:  Procedure Laterality Date   BRAIN HEMATOMA EVACUATION     five procedures total, first procedure when 18 months old, last at 57 years of age.   CYSTOSCOPY W/ RETROGRADES  09/24/2011   Procedure: CYSTOSCOPY WITH RETROGRADE PYELOGRAM;  Surgeon: Anner Crete, MD;  Location: [facility];  Service: Urology;  Laterality: Right;   CYSTOSCOPY WITH URETEROSCOPY  09/24/2011   Procedure: CYSTOSCOPY WITH URETEROSCOPY;  Surgeon: Anner Crete, MD;  Location: [facility];  Service: Urology;  Laterality: Right;  Balloon dilation right ureter    CYSTOSCOPY/RETROGRADE/URETEROSCOPY  08/24/2011   Procedure: CYSTOSCOPY/RETROGRADE/URETEROSCOPY;  Surgeon: Milford Cage, MD;  Location: [facility];  Service: Urology;  Laterality: Right;  Cysto, Right retrograde Pyelogram, right stent placement.    INCISIONAL HERNIA REPAIR  05/05/2011   Procedure: LAPAROSCOPIC INCISIONAL HERNIA;  Surgeon: Emelia Loron, MD;  Location: [facility] OR;  Service:  General;  Laterality: N/A;   kidney stone removal     LAPAROTOMY N/A 06/12/2021   Procedure: EXPLORATORY LAPAROTOMY, LYSIS OF ADHESIONS;  Surgeon: Henrene Dodge, MD;  Location: [facility] OR[facility];  Service: General;  Laterality: N/A;   MASS EXCISION  05/05/2011   Procedure: EXCISION MASS;  Surgeon: Emelia Loron, MD;  Location: [facility] OR;  Service: General;  Laterality: Right;   PCNL     RETINAL DETACHMENT SURGERY Left 1990   RIGHT/LEFT HEART CATH AND CORONARY ANGIOGRAPHY N/A 04/28/2019   Procedure:  RIGHT/LEFT HEART CATH AND CORONARY ANGIOGRAPHY;  Surgeon: Alwyn Pea, MD;  Location: [facility] INVASIVE CV LAB;  Service: Cardiovascular;  Laterality: N/A;   SHUNT REMOVAL     shunt inserted at age 4 removed at age 50    VAGINAL HYSTERECTOMY  47    Family History family history includes ADD / ADHD in her maternal grandfather; Birth defects in her daughter and maternal grandfather; Brain cancer in her maternal grandmother; Cancer in her mother; Cirrhosis in her maternal grandfather; Diabetes in her maternal grandfather; Hypertension in her daughter, maternal grandmother, and mother; Uterine cancer in her mother.  Social History Social History   Socioeconomic History   Marital status: Widowed    Spouse name: Not on file   Number of children: 2   Years of education: Not on file   Highest education level: 12th grade  Occupational History   Occupation: disabled  Tobacco Use   Smoking status: Former    Current packs/day: 0.00    Average packs/day: 1 pack/day for 15.0 years (15.0 ttl pk-yrs)    Types: Cigarettes    Start date: 01/20/1972    Quit date: 01/20/1987    Years since quitting: 36.0   Smokeless tobacco: Never   Tobacco comments:    quit more than 20 years  Vaping Use   Vaping status: Never Used  Substance and Sexual Activity   Alcohol use: Yes    Alcohol/week: 1.0 standard drink of alcohol    Types: 1 Glasses of wine per week   Drug use: No    Comment: hx of marijuana use no longer  uses    Sexual activity: Yes    Partners: Male    Birth control/protection: Surgical  Other Topics Concern   Not on file  Social History Narrative   Pt lives with her daughter   Social Drivers of Health   Financial Resource Strain: Medium Risk (06/28/2022)   Overall Financial Resource Strain (CARDIA)    Difficulty of Paying Living Expenses: Somewhat hard  Food Insecurity: No Food Insecurity (08/13/2022)   Hunger Vital Sign    Worried About Running Out of Food in the Last Year: Never true    Ran Out of Food in the Last Year: Never true  Recent Concern: Food Insecurity - Food Insecurity Present (06/28/2022)   Hunger Vital Sign    Worried About Running Out of Food in the Last Year: Often true    Ran Out of Food in the Last Year: Often true  Transportation Needs: No Transportation Needs (08/13/2022)   PRAPARE - Administrator, Civil Service (Medical): No    Lack of Transportation (Non-Medical): No  Physical Activity: Insufficiently Active (06/28/2022)   Exercise Vital Sign    Days of Exercise per Week: 1 day    Minutes of Exercise per Session: 10 min  Stress: No Stress Concern Present (06/28/2022)   [employer] of Occupational Health - Occupational Stress Questionnaire    Feeling of Stress : Not at all  Social Connections: Moderately Integrated (06/28/2022)   Social Connection and Isolation Panel [NHANES]    Frequency of Communication with Friends and Family: Twice a week    Frequency of Social Gatherings with Friends and Family: More than three times a week    Attends Religious Services: 1 to 4 times per year    Active Member of [employer] or Organizations: Yes    Attends Banker Meetings: Never    Marital Status: Widowed  Intimate Partner Violence:  Not At Risk (08/03/2022)   Humiliation, Afraid, Rape, and Kick questionnaire    Fear of Current or Ex-Partner: No    Emotionally Abused: No    Physically Abused: No    Sexually Abused: No    Lab Results   Component Value Date   HGBA1C 9.7 (H) 07/20/2022   HGBA1C 9.8 (A) 04/16/2022   HGBA1C 12.0 (H) 01/23/2022   Lab Results  Component Value Date   CHOL 128 06/02/2021   Lab Results  Component Value Date   HDL 41 (L) 06/02/2021   Lab Results  Component Value Date   LDLCALC 67 06/02/2021   Lab Results  Component Value Date   TRIG 71 06/19/2021   Lab Results  Component Value Date   CHOLHDL 3.1 06/02/2021   Lab Results  Component Value Date   CREATININE 0.88 08/10/2022   Lab Results  Component Value Date   GFR 74.92 04/14/2022   Lab Results  Component Value Date   MICROALBUR 3.6 (H) 04/16/2022      Component Value Date/Time   NA 134 (L) 08/09/2022 0534   NA 138 05/08/2015 1106   K 4.6 08/09/2022 0534   CL 107 08/09/2022 0534   CO2 21 (L) 08/09/2022 0534   GLUCOSE 114 (H) 08/09/2022 0534   BUN 24 (H) 08/09/2022 0534   BUN 14 05/08/2015 1106   CREATININE 0.88 08/10/2022 0459   CREATININE 0.95 01/23/2022 1157   CALCIUM 8.6 (L) 08/09/2022 0534   PROT 7.7 08/03/2022 1406   PROT 7.7 05/08/2015 1106   ALBUMIN 3.8 08/03/2022 1406   ALBUMIN 4.1 05/08/2015 1106   AST 16 08/03/2022 1406   ALT 27 08/03/2022 1406   ALKPHOS 99 08/03/2022 1406   BILITOT 0.5 08/03/2022 1406   BILITOT 0.3 05/08/2015 1106   GFRNONAA >60 08/10/2022 0459   GFRNONAA 81 11/28/2018 0000   GFRAA >60 05/30/2019 1543   GFRAA 94 11/28/2018 0000      Latest Ref Rng & Units 08/10/2022    4:59 AM 08/09/2022    5:34 AM 08/08/2022    8:57 AM  BMP  Glucose 70 - 99 mg/dL  425  956   BUN 6 - 20 mg/dL  24  35   Creatinine 3.87 - 1.00 mg/dL 5.64  3.32  9.51   Sodium 135 - 145 mmol/L  134  136   Potassium 3.5 - 5.1 mmol/L  4.6  4.4   Chloride 98 - 111 mmol/L  107  105   CO2 22 - 32 mmol/L  21  25   Calcium 8.9 - 10.3 mg/dL  8.6  9.0        Component Value Date/Time   WBC 7.5 08/09/2022 0534   RBC 4.02 08/09/2022 0534   HGB 11.8 (L) 08/09/2022 0534   HGB 13.1 05/08/2015 1106   HCT 37.3  08/09/2022 0534   HCT 39.5 05/08/2015 1106   PLT 214 08/09/2022 0534   PLT 274 05/08/2015 1106   MCV 92.8 08/09/2022 0534   MCV 91 05/08/2015 1106   MCH 29.4 08/09/2022 0534   MCHC 31.6 08/09/2022 0534   RDW 14.0 08/09/2022 0534   RDW 14.1 05/08/2015 1106   LYMPHSABS 1.7 08/03/2022 1406   LYMPHSABS 1.7 05/08/2015 1106   MONOABS 0.5 08/03/2022 1406   EOSABS 0.0 08/03/2022 1406   EOSABS 0.0 05/08/2015 1106   BASOSABS 0.0 08/03/2022 1406   BASOSABS 0.0 05/08/2015 1106     Parts of this note may have been dictated using voice  recognition software. There may be variances in spelling and vocabulary which are unintentional. Not all errors are proofread. Please notify the Thereasa Parkin if any discrepancies are noted or if the meaning of any statement is not clear.

## 2023-01-11 NOTE — Telephone Encounter (Signed)
Pt was seen for a my chart visit

## 2023-01-11 NOTE — Patient Instructions (Addendum)
Toujeo 26 units daily in a.m.  Humalog: 8 units for breakfast, 12 units for lunch and 8 units for dinner, 15 min before meals  Correction scale: Use in addition to your meal time Humalog insulin based on blood sugars as follows: 151 - 190: 1 unit 191 - 230: 2 units 231 - 270: 3 units 271 - 310: 4 units 311 - 350: 5 units 351 - 390: 6 units 391 - 430: 7 units   Resume Trulicity 1.5mg /week

## 2023-01-14 ENCOUNTER — Other Ambulatory Visit: Payer: Self-pay

## 2023-01-14 DIAGNOSIS — E1165 Type 2 diabetes mellitus with hyperglycemia: Secondary | ICD-10-CM

## 2023-01-14 MED ORDER — FREESTYLE LIBRE 3 PLUS SENSOR MISC: 1.0000 | 3 refills | Status: DC

## 2023-01-20 DIAGNOSIS — G4700 Insomnia, unspecified: Secondary | ICD-10-CM | POA: Insufficient documentation

## 2023-01-20 DIAGNOSIS — H547 Unspecified visual loss: Secondary | ICD-10-CM | POA: Insufficient documentation

## 2023-01-26 DIAGNOSIS — R569 Unspecified convulsions: Secondary | ICD-10-CM | POA: Diagnosis not present

## 2023-01-26 DIAGNOSIS — I639 Cerebral infarction, unspecified: Secondary | ICD-10-CM | POA: Diagnosis not present

## 2023-01-26 DIAGNOSIS — M79604 Pain in right leg: Secondary | ICD-10-CM | POA: Diagnosis not present

## 2023-01-26 DIAGNOSIS — I69354 Hemiplegia and hemiparesis following cerebral infarction affecting left non-dominant side: Secondary | ICD-10-CM | POA: Diagnosis not present

## 2023-01-26 DIAGNOSIS — R531 Weakness: Secondary | ICD-10-CM | POA: Diagnosis not present

## 2023-01-26 DIAGNOSIS — I10 Essential (primary) hypertension: Secondary | ICD-10-CM | POA: Diagnosis not present

## 2023-01-26 DIAGNOSIS — E782 Mixed hyperlipidemia: Secondary | ICD-10-CM | POA: Diagnosis not present

## 2023-01-26 DIAGNOSIS — E1165 Type 2 diabetes mellitus with hyperglycemia: Secondary | ICD-10-CM | POA: Diagnosis not present

## 2023-01-26 DIAGNOSIS — I6389 Other cerebral infarction: Secondary | ICD-10-CM | POA: Diagnosis not present

## 2023-01-26 DIAGNOSIS — R200 Anesthesia of skin: Secondary | ICD-10-CM | POA: Diagnosis not present

## 2023-01-26 DIAGNOSIS — R202 Paresthesia of skin: Secondary | ICD-10-CM | POA: Diagnosis not present

## 2023-01-26 DIAGNOSIS — E1149 Type 2 diabetes mellitus with other diabetic neurological complication: Secondary | ICD-10-CM | POA: Diagnosis not present

## 2023-01-26 DIAGNOSIS — Z794 Long term (current) use of insulin: Secondary | ICD-10-CM | POA: Diagnosis not present

## 2023-01-26 DIAGNOSIS — M79605 Pain in left leg: Secondary | ICD-10-CM | POA: Diagnosis not present

## 2023-01-26 DIAGNOSIS — G629 Polyneuropathy, unspecified: Secondary | ICD-10-CM | POA: Diagnosis not present

## 2023-01-26 DIAGNOSIS — E1142 Type 2 diabetes mellitus with diabetic polyneuropathy: Secondary | ICD-10-CM | POA: Diagnosis not present

## 2023-01-26 DIAGNOSIS — Z8673 Personal history of transient ischemic attack (TIA), and cerebral infarction without residual deficits: Secondary | ICD-10-CM | POA: Diagnosis not present

## 2023-01-30 ENCOUNTER — Other Ambulatory Visit: Payer: Self-pay | Admitting: "Endocrinology

## 2023-01-30 DIAGNOSIS — E1165 Type 2 diabetes mellitus with hyperglycemia: Secondary | ICD-10-CM

## 2023-02-04 ENCOUNTER — Ambulatory Visit (INDEPENDENT_AMBULATORY_CARE_PROVIDER_SITE_OTHER): Payer: 59 | Admitting: "Endocrinology

## 2023-02-04 ENCOUNTER — Encounter: Payer: Self-pay | Admitting: "Endocrinology

## 2023-02-04 VITALS — BP 130/70 | HR 79 | Ht 66.0 in | Wt 246.2 lb

## 2023-02-04 DIAGNOSIS — E1165 Type 2 diabetes mellitus with hyperglycemia: Secondary | ICD-10-CM

## 2023-02-04 DIAGNOSIS — Z794 Long term (current) use of insulin: Secondary | ICD-10-CM | POA: Diagnosis not present

## 2023-02-04 LAB — POCT GLYCOSYLATED HEMOGLOBIN (HGB A1C): Hemoglobin A1C: 11 % — AB (ref 4.0–5.6)

## 2023-02-04 MED ORDER — TRULICITY 3 MG/0.5ML ~~LOC~~ SOAJ: 3.0000 mg | SUBCUTANEOUS | 1 refills | Status: DC

## 2023-02-04 NOTE — Progress Notes (Signed)
Outpatient Endocrinology Note Karen Sabetha, MD  02/04/23   [name] [facility] [birth year] [id number]  Referring Provider: [name], [name] Primary Care Provider: [name], [name] Reason for consultation: Subjective   Assessment & Plan  Diagnoses and all orders for this visit:  Uncontrolled type 2 diabetes mellitus with hyperglycemia, with long-term current use of insulin (HCC) -     POCT glycosylated hemoglobin (Hb A1C) -     Microalbumin / creatinine urine ratio -     Lipid panel -     Comprehensive metabolic panel  Other orders -     Dulaglutide (TRULICITY) 3 MG/0.5ML SOAJ; Inject 3 mg as directed once a week.   Diabetes Type II complicated by CAD, stroke  Lab Results  Component Value Date   GFR 74.92 04/14/2022   Hba1c goal less than 7, current Hba1c is  Lab Results  Component Value Date   HGBA1C 11.0 (A) 02/04/2023   Will recommend the following: Toujeo 32 units daily in a.m. Humalog: 12 units for breakfast, lunch and dinner, 15 min before meals : Add 2 units if sugar is >200, add 4 units if sugar is >300 and add 6 untis if sugar >400) Trulicity 3 mg/week (patient is well tolerating and wants dose escalation)  Libre 3 is covered, Dexcom not covered  Stopped Trulicity 4.5 mg weekly due to diarrhea and bloating, will avoid high doses  Toujeo dose changed from 70 to 20 units recently during hospital visit and pt was stopped on Trulicity 4.5 mg weekly due to diarrhea and bloating   No known contraindications/side effects to any of above medications No history of MEN syndrome/medullary thyroid cancer/pancreatitis or pancreatic cancer in self or family Has Glucagon on file   -Last LD and Tg are as follows: Lab Results  Component Value Date   LDLCALC 67 06/02/2021    Lab Results  Component Value Date   TRIG 71 06/19/2021   -On rosuvastatin 40 mg every day, zetia 10 mg every day  -Follow low fat diet and exercise   -Blood pressure goal <140/90 -  Microalbumin/creatinine goal is < 30 -Last MA/Cr is as follows: Lab Results  Component Value Date   MICROALBUR 3.6 (H) 04/16/2022   -not on ACE/ARB  -diet changes including salt restriction -limit eating outside -counseled BP targets per standards of diabetes care -uncontrolled blood pressure can lead to retinopathy, nephropathy and cardiovascular and atherosclerotic heart disease  Reviewed and counseled on: -A1C target -Blood sugar targets -Complications of uncontrolled diabetes  -Checking blood sugar before meals and bedtime and bring log next visit -All medications with mechanism of action and side effects -Hypoglycemia management: rule of 15's, Glucagon Emergency Kit and medical alert ID -low-carb low-fat plate-method diet -At least 20 minutes of physical activity per day -Annual dilated retinal eye exam and foot exam -compliance and follow up needs -follow up as scheduled or earlier if problem gets worse  Call if blood sugar is less than 70 or consistently above 250    Take a 15 gm snack of carbohydrate at bedtime before you go to sleep if your blood sugar is less than 100.    If you are going to fast after midnight for a test or procedure, ask your physician for instructions on how to reduce/decrease your insulin dose.    Call if blood sugar is less than 70 or consistently above 250  -Treating a low sugar by rule of 15  (15 gms of sugar every 15 min until  sugar is more than 70) If you feel your sugar is low, test your sugar to be sure If your sugar is low (less than 70), then take 15 grams of a fast acting Carbohydrate (3-4 glucose tablets or glucose gel or 4 ounces of juice or regular soda) Recheck your sugar 15 min after treating low to make sure it is more than 70 If sugar is still less than 70, treat again with 15 grams of carbohydrate          Don't drive the hour of hypoglycemia  If unconscious/unable to eat or drink by mouth, use glucagon injection or nasal spray  baqsimi and call 911. Can repeat again in 15 min if still unconscious.  Return in about 1 week (around 02/11/2023) for visit and 8 am labs before next visit.   I have reviewed current medications, nurse's notes, allergies, vital signs, past medical and surgical history, family medical history, and social history for this encounter. Counseled patient on symptoms, examination findings, lab findings, imaging results, treatment decisions and monitoring and prognosis. The patient understood the recommendations and agrees with the treatment plan. All questions regarding treatment plan were fully answered.  Karen Monroe, MD  02/04/23   History of Present Illness [name] is a 58 y.o. year old female who presents for follow up of Type II diabetes mellitus.  Shakera Nicolich was first diagnosed in 1989.   Diabetes education +  Home diabetes regimen: Toujeo 26 units daily in a.m. Humalog: 8-10 units for breakfast, lunch, and dinner Trulicity 1.5 mg weekly  Background history:   She thinks she was initially treated with diet alone and subsequently given metformin With metformin she had nausea and diarrhea but she does not know if she was treated with any other medications until she was started on insulin in 2003.  Also was on glipizide in 2015 Most likely she was on Levemir or Lantus insulin for quite some time She was then switched to Toujeo in 2016 and although she was initially prescribed 90 units twice a day this was subsequently lowered  Her A1c has been occasionally between 7.2 and 7.5 but has been consistently over 9% since 2018  PREVIOUS OMNIPOD PUMP settings with Humalog U-200:  Basal settings: 12 AM = 2.6, 3 AM = 2.4, 9 AM = 2.7, 3 PM = 3.0 Sensitivity 1: 60 and target 140  Max bolus 6 units Active insulin time 6 hours  COMPLICATIONS +  MI/Stroke -  retinopathy -  neuropathy -  nephropathy  BLOOD SUGAR DATA  CGM interpretation: At today's visit, we  reviewed her CGM downloads. The full report is scanned in the media. Reviewing the CGM trends, BG are elevated across the day.   Physical Exam  BP 130/70   Pulse 79   Ht 5\' 6"  (1.676 m)   Wt 246 lb 3.2 oz (111.7 kg)   LMP  (LMP Unknown)   SpO2 93%   BMI 39.74 kg/m    Constitutional: well developed, well nourished Head: normocephalic, atraumatic Eyes: sclera anicteric, no redness Neck: supple Lungs: normal respiratory effort Neurology: alert and oriented Skin: dry, no appreciable rashes Musculoskeletal: no appreciable defects Psychiatric: normal mood and affect Diabetic Foot Exam - Simple   No data filed      Current Medications Patient's Medications  New Prescriptions   DULAGLUTIDE (TRULICITY) 3 MG/0.5ML SOAJ    Inject 3 mg as directed once a week.  Previous Medications   ALBUTEROL (ACCUNEB) 1.25 MG/3ML NEBULIZER SOLUTION  Take 3 mLs (1.25 mg total) by nebulization 2 (two) times daily.   ALBUTEROL (VENTOLIN HFA) 108 (90 BASE) MCG/ACT INHALER    Inhale 1-2 puffs into the lungs every 6 (six) hours as needed.   AMITRIPTYLINE (ELAVIL) 25 MG TABLET    Take 2-3 tablets (50-75 mg total) by mouth at bedtime.   AMLODIPINE (NORVASC) 10 MG TABLET    TAKE 1 TABLET BY MOUTH DAILY   BLOOD GLUCOSE METER KIT AND SUPPLIES KIT    Dispense based on patient and insurance preference. Use up to four times daily as directed. (FOR E11.65 Z79.4).   BLOOD GLUCOSE MONITORING SUPPL DEVI    1 each by Does not apply route in the morning, at noon, and at bedtime. May substitute to any manufacturer covered by patient's insurance.   BUPRENORPHINE (BUTRANS) 7.5 MCG/HR    Place 1 patch onto the skin once a week.   BUTALBITAL-ACETAMINOPHEN-CAFFEINE (FIORICET) 50-325-40 MG TABLET    Take 1-2 tablets by mouth every 6 (six) hours as needed for headache.   CARVEDILOL (COREG) 25 MG TABLET    Take 25 mg by mouth 2 (two) times daily.   CHOLECALCIFEROL (VITAMIN D3) 25 MCG (1000 UNIT) TABLET    Take 1 tablet (1,000  Units total) by mouth daily.   CLOPIDOGREL (PLAVIX) 75 MG TABLET    Take 1 tablet (75 mg total) by mouth daily.   CONTINUOUS GLUCOSE RECEIVER (DEXCOM G7 RECEIVER) DEVI    1 Device by Does not apply route continuous.   CONTINUOUS GLUCOSE SENSOR (DEXCOM G7 SENSOR) MISC    1 Device by Does not apply route continuous.   CONTINUOUS GLUCOSE SENSOR (FREESTYLE LIBRE 3 PLUS SENSOR) MISC    Inject 1 Device into the skin continuous. Change every 14 days   CONTINUOUS GLUCOSE SENSOR (FREESTYLE LIBRE 3 SENSOR) MISC    1 Device by Does not apply route continuous.   CYCLOSPORINE (RESTASIS) 0.05 % OPHTHALMIC EMULSION    Place 2 drops into both eyes 2 (two) times daily.   DULOXETINE (CYMBALTA) 60 MG CAPSULE    TAKE 1 CAPSULE BY MOUTH EVERY DAY   EPINEPHRINE 0.3 MG/0.3 ML IJ SOAJ INJECTION    Inject 0.3 mg into the muscle as needed for anaphylaxis.   EZETIMIBE (ZETIA) 10 MG TABLET    Take 1 tablet (10 mg total) by mouth daily.   FUROSEMIDE (LASIX) 20 MG TABLET    TAKE 1 TABLET BY MOUTH DAILY   GABAPENTIN (NEURONTIN) 600 MG TABLET    600 mg qAM, 600 mg qPM, 1200 mg QHS   GLUCAGON (GVOKE HYPOPEN 2-PACK) 1 MG/0.2ML SOAJ    INJECT 1 MG INTO THE SKIN ONCE AS NEEDED FOR UP TO 1 DOSE (HYPOGLYCEMIA). ONLY FOR SEVERE LOW BLOOD SUGAR NOT RESPONDING TO DRINKING JUICE/GLUCOSE TABLETS, WHEN CBG IS <70   GLUCOSE BLOOD (BLOOD GLUCOSE TEST STRIPS) STRP    Use as directed to monitor blood sugar up to 4x a day FOR E11.65 Z79.4   GLUCOSE BLOOD (BLOOD GLUCOSE TEST STRIPS) STRP    1 each by In Vitro route in the morning, at noon, and at bedtime. Needing Accu Check Guide Strips   HYDRALAZINE (APRESOLINE) 50 MG TABLET    Take 1 tablet by mouth 3 (three) times daily.   INSULIN GLARGINE, 2 UNIT DIAL, (TOUJEO MAX SOLOSTAR) 300 UNIT/ML SOLOSTAR PEN    20 units in am   INSULIN LISPRO (HUMALOG KWIKPEN) 100 UNIT/ML KWIKPEN    INJECT SUBCUTANEOUSLY UP TO 7  UNITS BEFORE  EACH MEAL   LANCETS MISC    Use as directed to monitor blood sugar up to 4x a  day FOR E11.65 Z79.4   LEVETIRACETAM (KEPPRA) 500 MG TABLET    Take 500-750 mg by mouth See admin instructions. Take 1 tablet (500mg ) by mouth every morning and take 1 tablets by mouth (750mg ) by mouth every night   MECLIZINE (ANTIVERT) 25 MG TABLET    Take 1 tablet (25 mg total) by mouth 3 (three) times daily as needed for dizziness.   MIRABEGRON ER (MYRBETRIQ) 50 MG TB24 TABLET    Take 1 tablet (50 mg total) by mouth daily.   PANTOPRAZOLE (PROTONIX) 40 MG TABLET    Take 1 tablet (40 mg total) by mouth daily.   POLYETHYLENE GLYCOL (MIRALAX / GLYCOLAX) 17 G PACKET    Take 17 g by mouth daily as needed for mild constipation.   ROSUVASTATIN (CRESTOR) 40 MG TABLET    TAKE 1 TABLET BY MOUTH DAILY   SENNA-DOCUSATE (SENOKOT-S) 8.6-50 MG TABLET    Take 1 tablet by mouth at bedtime as needed for mild constipation.   TIZANIDINE (ZANAFLEX) 4 MG TABLET    TAKE 1 TABLET (4 MG TOTAL) BY MOUTH EVERY DAY AT BEDTIME AS NEEDED   UBROGEPANT (UBRELVY) 50 MG TABS    50 mg or 100 mg taken orally with or without food. If needed, a second dose may be taken at least 2 hours after the initial dose. The maximum dose in a 24-hour period is 200 mg   WIXELA INHUB 250-50 MCG/DOSE AEPB    Inhale 1 puff into the lungs 2 (two) times daily.  Modified Medications   No medications on file  Discontinued Medications   DULAGLUTIDE (TRULICITY) 1.5 MG/0.5ML SOAJ    INJECT THE CONTENTS OF ONE PEN  SUBCUTANEOUSLY WEEKLY AS  DIRECTED    Allergies Allergies  Allergen Reactions   Codeine Swelling and Other (See Comments)    Swelling and burning of mouth (inside)   [place] [Dapagliflozin] Other (See Comments)    Recurrent UTI with farxiga   Lactose Intolerance (Gi) Nausea And Vomiting    Past Medical History Past Medical History:  Diagnosis Date   Anxiety and depression 09/13/2006   Qualifier: Diagnosis of  By: Daphine Deutscher [name], [place]     Arthritis    joint pain    Asthma    COPD (chronic obstructive pulmonary disease) (HCC)     chronic bronchitis    Depression    Diabetes mellitus    since age 14; type 2 IDDM   Diabetic neuropathy, painful (HCC)    FEET AND HANDS   Diabetic retinopathy (HCC)    Diastolic dysfunction    a.  Echo 11/17: EF 60-65%, mild LVH, no RWMA, Gr1DD, mild AI, dilated aortic root measuring 38 mm, mildly dilated ascending aorta   Dilated aortic root (HCC)    38mm by echo 11/2015   Dizziness    secondary to diabetes and hypertension    Epilepsy idiopathic petit mal (HCC)    last seizure 2012;controlled w/ topomax   GERD (gastroesophageal reflux disease)    Glaucoma    NOT ON ANY EYE DROPS    Headache(784.0)    migraines    Heart murmur    born with    History of stress test    a. 11/17: Normal perfusion, EF 53%, normal study   Hx of blood clots    hematomas removed from left side of brain from 11mos to 58yrs old  Hyperlipidemia    Hypertension    Legally blind    left eye    MIGRAINE HEADACHE 09/14/2006   Qualifier: Diagnosis of  By: Daphine Deutscher [name], [place]     Myocardial infarction [facility])    a.  Patient reported history of without objective documentation   Nephrolithiasis    frequent urination , urination at nite  PT SEEN IN ER 09/22/11 FOR BACK AND RT SIDED PAIN--HAS KNOWN STONE RT URETER AND UA IN ER SHOWED UTI   Pain 09/23/2011   LOWER BACK AND RIGHT SIDE--PT HAS RIGHT URETERAL STONE   Pneumonia    hx of 2009   Pyelonephritis    SBO (small bowel obstruction) (HCC) 04/23/2021   Seizures (HCC)    Sleep apnea    [facility];does not use Cpap ; mild   Small bowel obstruction (HCC) 05/18/2021   Stress incontinence    Stroke (HCC)    last 2003  RESIDUAL LEFT LEG WEAKNESS--NO OTHER RESIDUAL PROBLEMS    Past Surgical History Past Surgical History:  Procedure Laterality Date   BRAIN HEMATOMA EVACUATION     five procedures total, first procedure when 4 months old, last at 58 years of age.   CYSTOSCOPY W/ RETROGRADES  09/24/2011   Procedure: CYSTOSCOPY WITH  RETROGRADE PYELOGRAM;  Surgeon: Anner Crete, MD;  Location: [facility];  Service: Urology;  Laterality: Right;   CYSTOSCOPY WITH URETEROSCOPY  09/24/2011   Procedure: CYSTOSCOPY WITH URETEROSCOPY;  Surgeon: Anner Crete, MD;  Location: [facility];  Service: Urology;  Laterality: Right;  Balloon dilation right ureter    CYSTOSCOPY/RETROGRADE/URETEROSCOPY  08/24/2011   Procedure: CYSTOSCOPY/RETROGRADE/URETEROSCOPY;  Surgeon: Milford Cage, MD;  Location: [facility];  Service: Urology;  Laterality: Right;  Cysto, Right retrograde Pyelogram, right stent placement.    INCISIONAL HERNIA REPAIR  05/05/2011   Procedure: LAPAROSCOPIC INCISIONAL HERNIA;  Surgeon: Emelia Loron, MD;  Location: [facility] OR;  Service: General;  Laterality: N/A;   kidney stone removal     LAPAROTOMY N/A 06/12/2021   Procedure: EXPLORATORY LAPAROTOMY, LYSIS OF ADHESIONS;  Surgeon: Henrene Dodge, MD;  Location: [facility] ORS;  Service: General;  Laterality: N/A;   MASS EXCISION  05/05/2011   Procedure: EXCISION MASS;  Surgeon: Emelia Loron, MD;  Location: [facility] OR;  Service: General;  Laterality: Right;   PCNL     RETINAL DETACHMENT SURGERY Left 1990   RIGHT/LEFT HEART CATH AND CORONARY ANGIOGRAPHY N/A 04/28/2019   Procedure: RIGHT/LEFT HEART CATH AND CORONARY ANGIOGRAPHY;  Surgeon: Alwyn Pea, MD;  Location: [facility] INVASIVE CV LAB;  Service: Cardiovascular;  Laterality: N/A;   SHUNT REMOVAL     shunt inserted at age 76 removed at age 60    VAGINAL HYSTERECTOMY  35    Family History family history includes ADD / ADHD in her maternal grandfather; Birth defects in her daughter and maternal grandfather; Brain cancer in her maternal grandmother; Cancer in her mother; Cirrhosis in her maternal grandfather; Diabetes in her maternal grandfather; Hypertension in her daughter, maternal grandmother, and mother; Uterine cancer in her mother.  Social History Social History   Socioeconomic History   Marital status: Widowed    Spouse name:  Not on file   Number of children: 2   Years of education: Not on file   Highest education level: 12th grade  Occupational History   Occupation: disabled  Tobacco Use   Smoking status: Former    Current packs/day: 0.00    Average packs/day: 1 pack/day for 15.0  years (15.0 ttl pk-yrs)    Types: Cigarettes    Start date: 01/20/1972    Quit date: 01/20/1987    Years since quitting: 36.0   Smokeless tobacco: Never   Tobacco comments:    quit more than 20 years  Vaping Use   Vaping status: Never Used  Substance and Sexual Activity   Alcohol use: Yes    Alcohol/week: 1.0 standard drink of alcohol    Types: 1 Glasses of wine per week   Drug use: No    Comment: hx of marijuana use no longer uses    Sexual activity: Yes    Partners: Male    Birth control/protection: Surgical  Other Topics Concern   Not on file  Social History Narrative   Pt lives with her daughter   Social Drivers of Health   Financial Resource Strain: Medium Risk (06/28/2022)   Overall Financial Resource Strain (CARDIA)    Difficulty of Paying Living Expenses: Somewhat hard  Food Insecurity: No Food Insecurity (08/13/2022)   Hunger Vital Sign    Worried About Running Out of Food in the Last Year: Never true    Ran Out of Food in the Last Year: Never true  Recent Concern: Food Insecurity - Food Insecurity Present (06/28/2022)   Hunger Vital Sign    Worried About Running Out of Food in the Last Year: Often true    Ran Out of Food in the Last Year: Often true  Transportation Needs: No Transportation Needs (08/13/2022)   PRAPARE - Administrator, Civil Service (Medical): No    Lack of Transportation (Non-Medical): No  Physical Activity: Insufficiently Active (06/28/2022)   Exercise Vital Sign    Days of Exercise per Week: 1 day    Minutes of Exercise per Session: 10 min  Stress: No Stress Concern Present (06/28/2022)   [employer] of Occupational Health - Occupational Stress Questionnaire    Feeling of  Stress : Not at all  Social Connections: Moderately Integrated (06/28/2022)   Social Connection and Isolation Panel [NHANES]    Frequency of Communication with Friends and Family: Twice a week    Frequency of Social Gatherings with Friends and Family: More than three times a week    Attends Religious Services: 1 to 4 times per year    Active Member of [employer] or Organizations: Yes    Attends Banker Meetings: Never    Marital Status: Widowed  Intimate Partner Violence: Not At Risk (08/03/2022)   Humiliation, Afraid, Rape, and Kick questionnaire    Fear of Current or Ex-Partner: No    Emotionally Abused: No    Physically Abused: No    Sexually Abused: No    Lab Results  Component Value Date   HGBA1C 11.0 (A) 02/04/2023   HGBA1C 9.7 (H) 07/20/2022   HGBA1C 9.8 (A) 04/16/2022   Lab Results  Component Value Date   CHOL 128 06/02/2021   Lab Results  Component Value Date   HDL 41 (L) 06/02/2021   Lab Results  Component Value Date   LDLCALC 67 06/02/2021   Lab Results  Component Value Date   TRIG 71 06/19/2021   Lab Results  Component Value Date   CHOLHDL 3.1 06/02/2021   Lab Results  Component Value Date   CREATININE 0.88 08/10/2022   Lab Results  Component Value Date   GFR 74.92 04/14/2022   Lab Results  Component Value Date   MICROALBUR 3.6 (H) 04/16/2022  Component Value Date/Time   NA 134 (L) 08/09/2022 0534   NA 138 05/08/2015 1106   K 4.6 08/09/2022 0534   CL 107 08/09/2022 0534   CO2 21 (L) 08/09/2022 0534   GLUCOSE 114 (H) 08/09/2022 0534   BUN 24 (H) 08/09/2022 0534   BUN 14 05/08/2015 1106   CREATININE 0.88 08/10/2022 0459   CREATININE 0.95 01/23/2022 1157   CALCIUM 8.6 (L) 08/09/2022 0534   PROT 7.7 08/03/2022 1406   PROT 7.7 05/08/2015 1106   ALBUMIN 3.8 08/03/2022 1406   ALBUMIN 4.1 05/08/2015 1106   AST 16 08/03/2022 1406   ALT 27 08/03/2022 1406   ALKPHOS 99 08/03/2022 1406   BILITOT 0.5 08/03/2022 1406   BILITOT 0.3  05/08/2015 1106   GFRNONAA >60 08/10/2022 0459   GFRNONAA 81 11/28/2018 0000   GFRAA >60 05/30/2019 1543   GFRAA 94 11/28/2018 0000      Latest Ref Rng & Units 08/10/2022    4:59 AM 08/09/2022    5:34 AM 08/08/2022    8:57 AM  BMP  Glucose 70 - 99 mg/dL  191  478   BUN 6 - 20 mg/dL  24  35   Creatinine 2.95 - 1.00 mg/dL 6.21  3.08  6.57   Sodium 135 - 145 mmol/L  134  136   Potassium 3.5 - 5.1 mmol/L  4.6  4.4   Chloride 98 - 111 mmol/L  107  105   CO2 22 - 32 mmol/L  21  25   Calcium 8.9 - 10.3 mg/dL  8.6  9.0        Component Value Date/Time   WBC 7.5 08/09/2022 0534   RBC 4.02 08/09/2022 0534   HGB 11.8 (L) 08/09/2022 0534   HGB 13.1 05/08/2015 1106   HCT 37.3 08/09/2022 0534   HCT 39.5 05/08/2015 1106   PLT 214 08/09/2022 0534   PLT 274 05/08/2015 1106   MCV 92.8 08/09/2022 0534   MCV 91 05/08/2015 1106   MCH 29.4 08/09/2022 0534   MCHC 31.6 08/09/2022 0534   RDW 14.0 08/09/2022 0534   RDW 14.1 05/08/2015 1106   LYMPHSABS 1.7 08/03/2022 1406   LYMPHSABS 1.7 05/08/2015 1106   MONOABS 0.5 08/03/2022 1406   EOSABS 0.0 08/03/2022 1406   EOSABS 0.0 05/08/2015 1106   BASOSABS 0.0 08/03/2022 1406   BASOSABS 0.0 05/08/2015 1106     Parts of this note may have been dictated using voice recognition software. There may be variances in spelling and vocabulary which are unintentional. Not all errors are proofread. Please notify the Thereasa Parkin if any discrepancies are noted or if the meaning of any statement is not clear.

## 2023-02-04 NOTE — Patient Instructions (Signed)
Will recommend the following: Toujeo 32 units daily in a.m. Humalog: 12 units for breakfast, lunch and dinner, 15 min before meals  Add 2 units if sugar is >200, add 4 units if sugar is >300 and add 6 untis if sugar >400) Trulicity 3 mg/week

## 2023-02-05 ENCOUNTER — Other Ambulatory Visit: Payer: Self-pay

## 2023-02-05 ENCOUNTER — Encounter: Payer: Self-pay | Admitting: "Endocrinology

## 2023-02-05 ENCOUNTER — Telehealth: Payer: Self-pay

## 2023-02-05 DIAGNOSIS — E1165 Type 2 diabetes mellitus with hyperglycemia: Secondary | ICD-10-CM

## 2023-02-05 NOTE — Telephone Encounter (Signed)
Pt wants to know if she can have her labs the same day of her appt because of transportation issues. Please advise

## 2023-02-05 NOTE — Progress Notes (Signed)
Care Guide Pharmacy Note  02/05/2023 Name: [name] MRN: [id number] DOB: 09-03-1965  Referred By: [name], [name] Reason for referral: Care Coordination (TNM Diabetes. )   [name] is a 58 y.o. year old female who is a primary care patient of [name], [place].  [name] was referred to the pharmacist for assistance related to: DMII  An unsuccessful telephone outreach was attempted today to contact the patient who was referred to the pharmacy team for assistance with diabetes. Additional attempts will be made to contact the patient.  [facility]  [facility], [facility] Health Care Management Assistant Direct Dial: [phone]  Fax: [phone]

## 2023-02-08 ENCOUNTER — Telehealth: Payer: Self-pay

## 2023-02-08 DIAGNOSIS — Z8673 Personal history of transient ischemic attack (TIA), and cerebral infarction without residual deficits: Secondary | ICD-10-CM | POA: Diagnosis not present

## 2023-02-08 DIAGNOSIS — I10 Essential (primary) hypertension: Secondary | ICD-10-CM | POA: Diagnosis not present

## 2023-02-08 DIAGNOSIS — G4733 Obstructive sleep apnea (adult) (pediatric): Secondary | ICD-10-CM | POA: Diagnosis not present

## 2023-02-08 DIAGNOSIS — J449 Chronic obstructive pulmonary disease, unspecified: Secondary | ICD-10-CM | POA: Diagnosis not present

## 2023-02-08 NOTE — Progress Notes (Signed)
Care Guide Pharmacy Note  02/08/2023 Name: [name] MRN: [id number] DOB: [birth year]  Referred By: [name], [name] Reason for referral: Care Coordination (TNM Diabetes. )   [name] is a 58 y.o. year old female who is a primary care patient of [name], [place].  [name] was referred to the pharmacist for assistance related to: DMII  Successful contact was made with the patient to discuss pharmacy services.  Patient declines engagement at this time. Contact information was provided to the patient should they wish to reach out for assistance at a later time.  [facility]  [facility], [facility] Health Care Management Assistant Direct Dial: [phone]  Fax: [phone]

## 2023-02-09 ENCOUNTER — Other Ambulatory Visit: Payer: 59

## 2023-02-11 ENCOUNTER — Ambulatory Visit: Payer: 59 | Admitting: "Endocrinology

## 2023-02-16 ENCOUNTER — Encounter: Payer: Self-pay | Admitting: "Endocrinology

## 2023-02-16 ENCOUNTER — Ambulatory Visit (INDEPENDENT_AMBULATORY_CARE_PROVIDER_SITE_OTHER): Payer: 59 | Admitting: "Endocrinology

## 2023-02-16 VITALS — BP 106/68 | HR 93 | Ht 66.0 in | Wt 248.0 lb

## 2023-02-16 DIAGNOSIS — Z794 Long term (current) use of insulin: Secondary | ICD-10-CM | POA: Diagnosis not present

## 2023-02-16 DIAGNOSIS — E1165 Type 2 diabetes mellitus with hyperglycemia: Secondary | ICD-10-CM | POA: Diagnosis not present

## 2023-02-16 DIAGNOSIS — E7800 Pure hypercholesterolemia, unspecified: Secondary | ICD-10-CM | POA: Diagnosis not present

## 2023-02-16 DIAGNOSIS — Z7985 Long-term (current) use of injectable non-insulin antidiabetic drugs: Secondary | ICD-10-CM | POA: Diagnosis not present

## 2023-02-16 NOTE — Progress Notes (Signed)
Outpatient Endocrinology Note Altamese [place], MD  02/16/23   [name] [facility] November 23, 1965 [id number]  Referring Provider: [name], [name] Primary Care Provider: [name], [name] Reason for consultation: Subjective   Assessment & Plan  Diagnoses and all orders for this visit:  Uncontrolled type 2 diabetes mellitus with hyperglycemia, with long-term current use of insulin (HCC) -     Microalbumin / creatinine urine ratio -     Lipid panel -     Comprehensive metabolic panel -     C-peptide  Long-term (current) use of injectable non-insulin antidiabetic drugs  Long-term insulin use (HCC)  Pure hypercholesterolemia    Diabetes Type II complicated by CAD, stroke  Lab Results  Component Value Date   GFR 74.92 04/14/2022   Hba1c goal less than 7, current Hba1c is  Lab Results  Component Value Date   HGBA1C 11.0 (A) 02/04/2023   Will recommend the following: Toujeo 33 units daily in a.m. Humalog: 9 for breakfast, 13 for lunch, and 9 for dinner (13 units if BG>200, otherwise it is 9 units)  Trulicity 3 mg/week (patient is well tolerating and wants dose escalation)  Libre 3 is covered, Dexcom not covered  Stopped Trulicity 4.5 mg weekly due to diarrhea and bloating, will avoid high doses  Toujeo dose changed from 70 to 20 units recently during hospital visit and pt was stopped on Trulicity 4.5 mg weekly due to diarrhea and bloating   No known contraindications/side effects to any of above medications No history of MEN syndrome/medullary thyroid cancer/pancreatitis or pancreatic cancer in self or family Has Glucagon on file   -Last LD and Tg are as follows: Lab Results  Component Value Date   LDLCALC 67 06/02/2021    Lab Results  Component Value Date   TRIG 71 06/19/2021   -On rosuvastatin 40 mg every day, zetia 10 mg every day  -Follow low fat diet and exercise   -Blood pressure goal <140/90 - Microalbumin/creatinine goal is < 30 -Last MA/Cr is  as follows: Lab Results  Component Value Date   MICROALBUR 3.6 (H) 04/16/2022   -not on ACE/ARB  -diet changes including salt restriction -limit eating outside -counseled BP targets per standards of diabetes care -uncontrolled blood pressure can lead to retinopathy, nephropathy and cardiovascular and atherosclerotic heart disease  Reviewed and counseled on: -A1C target -Blood sugar targets -Complications of uncontrolled diabetes  -Checking blood sugar before meals and bedtime and bring log next visit -All medications with mechanism of action and side effects -Hypoglycemia management: rule of 15's, Glucagon Emergency Kit and medical alert ID -low-carb low-fat plate-method diet -At least 20 minutes of physical activity per day -Annual dilated retinal eye exam and foot exam -compliance and follow up needs -follow up as scheduled or earlier if problem gets worse  Call if blood sugar is less than 70 or consistently above 250    Take a 15 gm snack of carbohydrate at bedtime before you go to sleep if your blood sugar is less than 100.    If you are going to fast after midnight for a test or procedure, ask your physician for instructions on how to reduce/decrease your insulin dose.    Call if blood sugar is less than 70 or consistently above 250  -Treating a low sugar by rule of 15  (15 gms of sugar every 15 min until sugar is more than 70) If you feel your sugar is low, test your sugar to be sure If your  sugar is low (less than 70), then take 15 grams of a fast acting Carbohydrate (3-4 glucose tablets or glucose gel or 4 ounces of juice or regular soda) Recheck your sugar 15 min after treating low to make sure it is more than 70 If sugar is still less than 70, treat again with 15 grams of carbohydrate          Don't drive the hour of hypoglycemia  If unconscious/unable to eat or drink by mouth, use glucagon injection or nasal spray baqsimi and call 911. Can repeat again in 15 min if  still unconscious.  Return in about 4 weeks (around 03/16/2023) for visit, labs today.   I have reviewed current medications, nurse's notes, allergies, vital signs, past medical and surgical history, family medical history, and social history for this encounter. Counseled patient on symptoms, examination findings, lab findings, imaging results, treatment decisions and monitoring and prognosis. The patient understood the recommendations and agrees with the treatment plan. All questions regarding treatment plan were fully answered.  Altamese Deercroft, MD  02/16/23   History of Present Illness [name] is a 58 y.o. year old female who presents for follow up of Type II diabetes mellitus.  Shanesha Bednarz was first diagnosed in 1989.   Diabetes education +  Home diabetes regimen: Toujeo 32 units daily in a.m. Humalog: 8 for breakfast, 12 for lunch, and 8 for dinner (12 units if BG>200, otherwise it is 8 units)  Trulicity 3 mg weekly  Background history:   She thinks she was initially treated with diet alone and subsequently given metformin With metformin she had nausea and diarrhea but she does not know if she was treated with any other medications until she was started on insulin in 2003.  Also was on glipizide in 2015 Most likely she was on Levemir or Lantus insulin for quite some time She was then switched to Toujeo in 2016 and although she was initially prescribed 90 units twice a day this was subsequently lowered  Her A1c has been occasionally between 7.2 and 7.5 but has been consistently over 9% since 2018  PREVIOUS OMNIPOD PUMP settings with Humalog U-200:  Basal settings: 12 AM = 2.6, 3 AM = 2.4, 9 AM = 2.7, 3 PM = 3.0 Sensitivity 1: 60 and target 140  Max bolus 6 units Active insulin time 6 hours  COMPLICATIONS +  MI/Stroke -  retinopathy -  neuropathy -  nephropathy  BLOOD SUGAR DATA  CGM interpretation: At today's visit, we reviewed her CGM downloads.  The full report is scanned in the media. Reviewing the CGM trends, BG are mildly elevated after break fast and lunch.   Physical Exam  BP 106/68   Pulse 93   Ht 5\' 6"  (1.676 m)   Wt 248 lb (112.5 kg)   LMP  (LMP Unknown)   SpO2 95%   BMI 40.03 kg/m    Constitutional: well developed, well nourished Head: normocephalic, atraumatic Eyes: sclera anicteric, no redness Neck: supple Lungs: normal respiratory effort Neurology: alert and oriented Skin: dry, no appreciable rashes Musculoskeletal: no appreciable defects Psychiatric: normal mood and affect Diabetic Foot Exam - Simple   No data filed      Current Medications Patient's Medications  New Prescriptions   No medications on file  Previous Medications   ALBUTEROL (ACCUNEB) 1.25 MG/3ML NEBULIZER SOLUTION    Take 3 mLs (1.25 mg total) by nebulization 2 (two) times daily.   ALBUTEROL (VENTOLIN HFA) 108 (90 BASE)  MCG/ACT INHALER    Inhale 1-2 puffs into the lungs every 6 (six) hours as needed.   AMITRIPTYLINE (ELAVIL) 25 MG TABLET    Take 2-3 tablets (50-75 mg total) by mouth at bedtime.   AMLODIPINE (NORVASC) 10 MG TABLET    TAKE 1 TABLET BY MOUTH DAILY   BLOOD GLUCOSE METER KIT AND SUPPLIES KIT    Dispense based on patient and insurance preference. Use up to four times daily as directed. (FOR E11.65 Z79.4).   BLOOD GLUCOSE MONITORING SUPPL DEVI    1 each by Does not apply route in the morning, at noon, and at bedtime. May substitute to any manufacturer covered by patient's insurance.   BUPRENORPHINE (BUTRANS) 7.5 MCG/HR    Place 1 patch onto the skin once a week.   BUTALBITAL-ACETAMINOPHEN-CAFFEINE (FIORICET) 50-325-40 MG TABLET    Take 1-2 tablets by mouth every 6 (six) hours as needed for headache.   CARVEDILOL (COREG) 25 MG TABLET    Take 25 mg by mouth 2 (two) times daily.   CHOLECALCIFEROL (VITAMIN D3) 25 MCG (1000 UNIT) TABLET    Take 1 tablet (1,000 Units total) by mouth daily.   CLOPIDOGREL (PLAVIX) 75 MG TABLET    Take 1  tablet (75 mg total) by mouth daily.   CONTINUOUS GLUCOSE RECEIVER (DEXCOM G7 RECEIVER) DEVI    1 Device by Does not apply route continuous.   CONTINUOUS GLUCOSE SENSOR (DEXCOM G7 SENSOR) MISC    1 Device by Does not apply route continuous.   CONTINUOUS GLUCOSE SENSOR (FREESTYLE LIBRE 3 PLUS SENSOR) MISC    Inject 1 Device into the skin continuous. Change every 14 days   CONTINUOUS GLUCOSE SENSOR (FREESTYLE LIBRE 3 SENSOR) MISC    1 Device by Does not apply route continuous.   CYCLOSPORINE (RESTASIS) 0.05 % OPHTHALMIC EMULSION    Place 2 drops into both eyes 2 (two) times daily.   DULAGLUTIDE (TRULICITY) 3 MG/0.5ML SOAJ    Inject 3 mg as directed once a week.   DULOXETINE (CYMBALTA) 60 MG CAPSULE    TAKE 1 CAPSULE BY MOUTH EVERY DAY   EPINEPHRINE 0.3 MG/0.3 ML IJ SOAJ INJECTION    Inject 0.3 mg into the muscle as needed for anaphylaxis.   EZETIMIBE (ZETIA) 10 MG TABLET    Take 1 tablet (10 mg total) by mouth daily.   FUROSEMIDE (LASIX) 20 MG TABLET    TAKE 1 TABLET BY MOUTH DAILY   GABAPENTIN (NEURONTIN) 600 MG TABLET    600 mg qAM, 600 mg qPM, 1200 mg QHS   GLUCAGON (GVOKE HYPOPEN 2-PACK) 1 MG/0.2ML SOAJ    INJECT 1 MG INTO THE SKIN ONCE AS NEEDED FOR UP TO 1 DOSE (HYPOGLYCEMIA). ONLY FOR SEVERE LOW BLOOD SUGAR NOT RESPONDING TO DRINKING JUICE/GLUCOSE TABLETS, WHEN CBG IS <70   GLUCOSE BLOOD (BLOOD GLUCOSE TEST STRIPS) STRP    Use as directed to monitor blood sugar up to 4x a day FOR E11.65 Z79.4   GLUCOSE BLOOD (BLOOD GLUCOSE TEST STRIPS) STRP    1 each by In Vitro route in the morning, at noon, and at bedtime. Needing Accu Check Guide Strips   HYDRALAZINE (APRESOLINE) 50 MG TABLET    Take 1 tablet by mouth 3 (three) times daily.   INSULIN GLARGINE, 2 UNIT DIAL, (TOUJEO MAX SOLOSTAR) 300 UNIT/ML SOLOSTAR PEN    20 units in am   INSULIN LISPRO (HUMALOG KWIKPEN) 100 UNIT/ML KWIKPEN    INJECT SUBCUTANEOUSLY UP TO 7  UNITS BEFORE EACH MEAL  LANCETS MISC    Use as directed to monitor blood sugar up  to 4x a day FOR E11.65 Z79.4   LEVETIRACETAM (KEPPRA) 500 MG TABLET    Take 500-750 mg by mouth See admin instructions. Take 1 tablet (500mg ) by mouth every morning and take 1 tablets by mouth (750mg ) by mouth every night   MECLIZINE (ANTIVERT) 25 MG TABLET    Take 1 tablet (25 mg total) by mouth 3 (three) times daily as needed for dizziness.   MIRABEGRON ER (MYRBETRIQ) 50 MG TB24 TABLET    Take 1 tablet (50 mg total) by mouth daily.   PANTOPRAZOLE (PROTONIX) 40 MG TABLET    Take 1 tablet (40 mg total) by mouth daily.   POLYETHYLENE GLYCOL (MIRALAX / GLYCOLAX) 17 G PACKET    Take 17 g by mouth daily as needed for mild constipation.   ROSUVASTATIN (CRESTOR) 40 MG TABLET    TAKE 1 TABLET BY MOUTH DAILY   SENNA-DOCUSATE (SENOKOT-S) 8.6-50 MG TABLET    Take 1 tablet by mouth at bedtime as needed for mild constipation.   TIZANIDINE (ZANAFLEX) 4 MG TABLET    TAKE 1 TABLET (4 MG TOTAL) BY MOUTH EVERY DAY AT BEDTIME AS NEEDED   UBROGEPANT (UBRELVY) 50 MG TABS    50 mg or 100 mg taken orally with or without food. If needed, a second dose may be taken at least 2 hours after the initial dose. The maximum dose in a 24-hour period is 200 mg   WIXELA INHUB 250-50 MCG/DOSE AEPB    Inhale 1 puff into the lungs 2 (two) times daily.  Modified Medications   No medications on file  Discontinued Medications   No medications on file    Allergies Allergies  Allergen Reactions   Codeine Swelling and Other (See Comments)    Swelling and burning of mouth (inside)   [place] [Dapagliflozin] Other (See Comments)    Recurrent UTI with farxiga   Lactose Intolerance (Gi) Nausea And Vomiting    Past Medical History Past Medical History:  Diagnosis Date   Anxiety and depression 09/13/2006   Qualifier: Diagnosis of  By: Daphine Deutscher [name], [place]     Arthritis    joint pain    Asthma    COPD (chronic obstructive pulmonary disease) (HCC)    chronic bronchitis    Depression    Diabetes mellitus    since age 33; type 2  IDDM   Diabetic neuropathy, painful (HCC)    FEET AND HANDS   Diabetic retinopathy (HCC)    Diastolic dysfunction    a.  Echo 11/17: EF 60-65%, mild LVH, no RWMA, Gr1DD, mild AI, dilated aortic root measuring 38 mm, mildly dilated ascending aorta   Dilated aortic root (HCC)    38mm by echo 11/2015   Dizziness    secondary to diabetes and hypertension    Epilepsy idiopathic petit mal (HCC)    last seizure 2012;controlled w/ topomax   GERD (gastroesophageal reflux disease)    Glaucoma    NOT ON ANY EYE DROPS    Headache(784.0)    migraines    Heart murmur    born with    History of stress test    a. 11/17: Normal perfusion, EF 53%, normal study   Hx of blood clots    hematomas removed from left side of brain from 11mos to 58yrs old    Hyperlipidemia    Hypertension    Legally blind    left eye  MIGRAINE HEADACHE 09/14/2006   Qualifier: Diagnosis of  By: Daphine Deutscher [name], Nykedtra     Myocardial infarction [facility])    a.  Patient reported history of without objective documentation   Nephrolithiasis    frequent urination , urination at nite  PT SEEN IN ER 09/22/11 FOR BACK AND RT SIDED PAIN--HAS KNOWN STONE RT URETER AND UA IN ER SHOWED UTI   Pain 09/23/2011   LOWER BACK AND RIGHT SIDE--PT HAS RIGHT URETERAL STONE   Pneumonia    hx of 2009   Pyelonephritis    SBO (small bowel obstruction) (HCC) 04/23/2021   Seizures (HCC)    Sleep apnea    [facility];does not use Cpap ; mild   Small bowel obstruction (HCC) 05/18/2021   Stress incontinence    Stroke (HCC)    last 2003  RESIDUAL LEFT LEG WEAKNESS--NO OTHER RESIDUAL PROBLEMS    Past Surgical History Past Surgical History:  Procedure Laterality Date   BRAIN HEMATOMA EVACUATION     five procedures total, first procedure when 5 months old, last at 58 years of age.   CYSTOSCOPY W/ RETROGRADES  09/24/2011   Procedure: CYSTOSCOPY WITH RETROGRADE PYELOGRAM;  Surgeon: Anner Crete, MD;  Location: [facility];  Service:  Urology;  Laterality: Right;   CYSTOSCOPY WITH URETEROSCOPY  09/24/2011   Procedure: CYSTOSCOPY WITH URETEROSCOPY;  Surgeon: Anner Crete, MD;  Location: [facility];  Service: Urology;  Laterality: Right;  Balloon dilation right ureter    CYSTOSCOPY/RETROGRADE/URETEROSCOPY  08/24/2011   Procedure: CYSTOSCOPY/RETROGRADE/URETEROSCOPY;  Surgeon: Milford Cage, MD;  Location: [facility];  Service: Urology;  Laterality: Right;  Cysto, Right retrograde Pyelogram, right stent placement.    INCISIONAL HERNIA REPAIR  05/05/2011   Procedure: LAPAROSCOPIC INCISIONAL HERNIA;  Surgeon: Emelia Loron, MD;  Location: [name] OR;  Service: General;  Laterality: N/A;   kidney stone removal     LAPAROTOMY N/A 06/12/2021   Procedure: EXPLORATORY LAPAROTOMY, LYSIS OF ADHESIONS;  Surgeon: Henrene Dodge, MD;  Location: [facility] ORS;  Service: General;  Laterality: N/A;   MASS EXCISION  05/05/2011   Procedure: EXCISION MASS;  Surgeon: Emelia Loron, MD;  Location[facility] OR;  Service: General;  Laterality: Right;   [facility]     RETINAL DETACHMENT SURGERY Left 1990   RIGHT/LEFT HEART CATH AND CORONARY ANGIOGRAPHY N/A 04/28/2019   Procedure: RIGHT/LEFT HEART CATH AND CORONARY ANGIOGRAPHY;  Surgeon: Alwyn Pea, MD;  Location: [facility] INVASIVE CV LAB;  Service: Cardiovascular;  Laterality: N/A;   SHUNT REMOVAL     shunt inserted at age 54 removed at age 89    VAGINAL HYSTERECTOMY  56    Family History family history includes ADD / ADHD in her maternal grandfather; Birth defects in her daughter and maternal grandfather; Brain cancer in her maternal grandmother; Cancer in her mother; Cirrhosis in her maternal grandfather; Diabetes in her maternal grandfather; Hypertension in her daughter, maternal grandmother, and mother; Uterine cancer in her mother.  Social History Social History   Socioeconomic History   Marital status: Widowed    Spouse name: Not on file   Number of children: 2   Years of education: Not on file    Highest education level: 12th grade  Occupational History   Occupation: disabled  Tobacco Use   Smoking status: Former    Current packs/day: 0.00    Average packs/day: 1 pack/day for 15.0 years (15.0 ttl pk-yrs)    Types: Cigarettes    Start date: 01/20/1972  Quit date: 01/20/1987    Years since quitting: 36.0   Smokeless tobacco: Never   Tobacco comments:    quit more than 20 years  Vaping Use   Vaping status: Never Used  Substance and Sexual Activity   Alcohol use: Yes    Alcohol/week: 1.0 standard drink of alcohol    Types: 1 Glasses of wine per week   Drug use: No    Comment: hx of marijuana use no longer uses    Sexual activity: Yes    Partners: Male    Birth control/protection: Surgical  Other Topics Concern   Not on file  Social History Narrative   Pt lives with her daughter   Social Drivers of Health   Financial Resource Strain: Medium Risk (06/28/2022)   Overall Financial Resource Strain (CARDIA)    Difficulty of Paying Living Expenses: Somewhat hard  Food Insecurity: No Food Insecurity (08/13/2022)   Hunger Vital Sign    Worried About Running Out of Food in the Last Year: Never true    Ran Out of Food in the Last Year: Never true  Recent Concern: Food Insecurity - Food Insecurity Present (06/28/2022)   Hunger Vital Sign    Worried About Running Out of Food in the Last Year: Often true    Ran Out of Food in the Last Year: Often true  Transportation Needs: No Transportation Needs (08/13/2022)   PRAPARE - Administrator, Civil Service (Medical): No    Lack of Transportation (Non-Medical): No  Physical Activity: Insufficiently Active (06/28/2022)   Exercise Vital Sign    Days of Exercise per Week: 1 day    Minutes of Exercise per Session: 10 min  Stress: No Stress Concern Present (06/28/2022)   [employer] of Occupational Health - Occupational Stress Questionnaire    Feeling of Stress : Not at all  Social Connections: Moderately Integrated (06/28/2022)    Social Connection and Isolation Panel [NHANES]    Frequency of Communication with Friends and Family: Twice a week    Frequency of Social Gatherings with Friends and Family: More than three times a week    Attends Religious Services: 1 to 4 times per year    Active Member of [employer] or Organizations: Yes    Attends Banker Meetings: Never    Marital Status: Widowed  Intimate Partner Violence: Not At Risk (08/03/2022)   Humiliation, Afraid, Rape, and Kick questionnaire    Fear of Current or Ex-Partner: No    Emotionally Abused: No    Physically Abused: No    Sexually Abused: No    Lab Results  Component Value Date   HGBA1C 11.0 (A) 02/04/2023   HGBA1C 9.7 (H) 07/20/2022   HGBA1C 9.8 (A) 04/16/2022   Lab Results  Component Value Date   CHOL 128 06/02/2021   Lab Results  Component Value Date   HDL 41 (L) 06/02/2021   Lab Results  Component Value Date   LDLCALC 67 06/02/2021   Lab Results  Component Value Date   TRIG 71 06/19/2021   Lab Results  Component Value Date   CHOLHDL 3.1 06/02/2021   Lab Results  Component Value Date   CREATININE 0.88 08/10/2022   Lab Results  Component Value Date   GFR 74.92 04/14/2022   Lab Results  Component Value Date   MICROALBUR 3.6 (H) 04/16/2022      Component Value Date/Time   NA 134 (L) 08/09/2022 0534   NA 138 05/08/2015 1106  K 4.6 08/09/2022 0534   CL 107 08/09/2022 0534   CO2 21 (L) 08/09/2022 0534   GLUCOSE 114 (H) 08/09/2022 0534   BUN 24 (H) 08/09/2022 0534   BUN 14 05/08/2015 1106   CREATININE 0.88 08/10/2022 0459   CREATININE 0.95 01/23/2022 1157   CALCIUM 8.6 (L) 08/09/2022 0534   PROT 7.7 08/03/2022 1406   PROT 7.7 05/08/2015 1106   ALBUMIN 3.8 08/03/2022 1406   ALBUMIN 4.1 05/08/2015 1106   AST 16 08/03/2022 1406   ALT 27 08/03/2022 1406   ALKPHOS 99 08/03/2022 1406   BILITOT 0.5 08/03/2022 1406   BILITOT 0.3 05/08/2015 1106   GFRNONAA >60 08/10/2022 0459   GFRNONAA 81 11/28/2018 0000    GFRAA >60 05/30/2019 1543   GFRAA 94 11/28/2018 0000      Latest Ref Rng & Units 08/10/2022    4:59 AM 08/09/2022    5:34 AM 08/08/2022    8:57 AM  BMP  Glucose 70 - 99 mg/dL  161  096   BUN 6 - 20 mg/dL  24  35   Creatinine 0.45 - 1.00 mg/dL 4.09  8.11  9.14   Sodium 135 - 145 mmol/L  134  136   Potassium 3.5 - 5.1 mmol/L  4.6  4.4   Chloride 98 - 111 mmol/L  107  105   CO2 22 - 32 mmol/L  21  25   Calcium 8.9 - 10.3 mg/dL  8.6  9.0        Component Value Date/Time   WBC 7.5 08/09/2022 0534   RBC 4.02 08/09/2022 0534   HGB 11.8 (L) 08/09/2022 0534   HGB 13.1 05/08/2015 1106   HCT 37.3 08/09/2022 0534   HCT 39.5 05/08/2015 1106   PLT 214 08/09/2022 0534   PLT 274 05/08/2015 1106   MCV 92.8 08/09/2022 0534   MCV 91 05/08/2015 1106   MCH 29.4 08/09/2022 0534   MCHC 31.6 08/09/2022 0534   RDW 14.0 08/09/2022 0534   RDW 14.1 05/08/2015 1106   LYMPHSABS 1.7 08/03/2022 1406   LYMPHSABS 1.7 05/08/2015 1106   MONOABS 0.5 08/03/2022 1406   EOSABS 0.0 08/03/2022 1406   EOSABS 0.0 05/08/2015 1106   BASOSABS 0.0 08/03/2022 1406   BASOSABS 0.0 05/08/2015 1106     Parts of this note may have been dictated using voice recognition software. There may be variances in spelling and vocabulary which are unintentional. Not all errors are proofread. Please notify the Thereasa Parkin if any discrepancies are noted or if the meaning of any statement is not clear.

## 2023-02-16 NOTE — Patient Instructions (Signed)
Will recommend the following: Toujeo 33 units daily in a.m. Humalog: 9 for breakfast, 13 for lunch, and 9 for dinner (13 units if BG>200, otherwise it is 9 units)  Trulicity 3 mg/week (patient is well tolerating and wants dose escalation)

## 2023-02-17 ENCOUNTER — Encounter: Payer: Self-pay | Admitting: "Endocrinology

## 2023-02-17 LAB — C-PEPTIDE: C-Peptide: 3.61 ng/mL (ref 0.80–3.85)

## 2023-02-17 LAB — COMPREHENSIVE METABOLIC PANEL
AG Ratio: 1.4 (calc) (ref 1.0–2.5)
ALT: 10 U/L (ref 6–29)
AST: 8 U/L — ABNORMAL LOW (ref 10–35)
Albumin: 4.1 g/dL (ref 3.6–5.1)
Alkaline phosphatase (APISO): 92 U/L (ref 37–153)
BUN/Creatinine Ratio: 24 (calc) — ABNORMAL HIGH (ref 6–22)
BUN: 32 mg/dL — ABNORMAL HIGH (ref 7–25)
CO2: 30 mmol/L (ref 20–32)
Calcium: 9.4 mg/dL (ref 8.6–10.4)
Chloride: 97 mmol/L — ABNORMAL LOW (ref 98–110)
Creat: 1.31 mg/dL — ABNORMAL HIGH (ref 0.50–1.03)
Globulin: 3 g/dL (ref 1.9–3.7)
Glucose, Bld: 214 mg/dL — ABNORMAL HIGH (ref 65–99)
Potassium: 4.1 mmol/L (ref 3.5–5.3)
Sodium: 137 mmol/L (ref 135–146)
Total Bilirubin: 0.3 mg/dL (ref 0.2–1.2)
Total Protein: 7.1 g/dL (ref 6.1–8.1)

## 2023-02-17 LAB — LIPID PANEL
Cholesterol: 129 mg/dL (ref <200)
HDL: 45 mg/dL — ABNORMAL LOW (ref >=50)
LDL Cholesterol (Calc): 60 mg/dL
Non-HDL Cholesterol (Calc): 84 mg/dL (ref <130)
Total CHOL/HDL Ratio: 2.9 (calc) (ref <5.0)
Triglycerides: 166 mg/dL — ABNORMAL HIGH (ref <150)

## 2023-02-18 ENCOUNTER — Encounter: Payer: Self-pay | Admitting: Student in an Organized Health Care Education/Training Program

## 2023-02-18 ENCOUNTER — Ambulatory Visit
Payer: 59 | Attending: Student in an Organized Health Care Education/Training Program | Admitting: Student in an Organized Health Care Education/Training Program

## 2023-02-18 DIAGNOSIS — G890 Central pain syndrome: Secondary | ICD-10-CM

## 2023-02-18 DIAGNOSIS — G43E01 Chronic migraine with aura, not intractable, with status migrainosus: Secondary | ICD-10-CM

## 2023-02-18 DIAGNOSIS — M25552 Pain in left hip: Secondary | ICD-10-CM | POA: Diagnosis not present

## 2023-02-18 DIAGNOSIS — E1142 Type 2 diabetes mellitus with diabetic polyneuropathy: Secondary | ICD-10-CM | POA: Diagnosis not present

## 2023-02-18 DIAGNOSIS — M25551 Pain in right hip: Secondary | ICD-10-CM | POA: Diagnosis not present

## 2023-02-18 DIAGNOSIS — G894 Chronic pain syndrome: Secondary | ICD-10-CM | POA: Diagnosis not present

## 2023-02-18 MED ORDER — UBRELVY 50 MG PO TABS: ORAL_TABLET | ORAL | 2 refills | Status: AC

## 2023-02-18 MED ORDER — AMITRIPTYLINE HCL 25 MG PO TABS: 50.0000 mg | ORAL_TABLET | Freq: Every day | ORAL | 5 refills | Status: DC

## 2023-02-18 MED ORDER — TIZANIDINE HCL 4 MG PO TABS: 4.0000 mg | ORAL_TABLET | Freq: Three times a day (TID) | ORAL | 2 refills | Status: DC | PRN

## 2023-02-18 MED ORDER — BUPRENORPHINE 7.5 MCG/HR TD PTWK: 1.0000 | MEDICATED_PATCH | TRANSDERMAL | 2 refills | Status: DC

## 2023-02-18 MED ORDER — GABAPENTIN 600 MG PO TABS: ORAL_TABLET | ORAL | 5 refills | Status: DC

## 2023-02-18 NOTE — Progress Notes (Signed)
PROVIDER NOTE: Information contained herein reflects review and annotations entered in association with encounter. Interpretation of such information and data should be left to medically-trained personnel. Information provided to patient can be located elsewhere in the medical record under "Patient Instructions". Document created using STT-dictation technology, any transcriptional errors that may result from process are unintentional.    Patient: [name]  Service Category: E/M  Provider: Edward Jolly, MD  DOB: 1965-04-16  DOS: 02/18/2023  Referring Provider: Sander Radon  MRN: [id number]  Specialty: Interventional Pain Management  PCP: [name], [name]  Type: Established Patient  Setting: Ambulatory outpatient    Location: Office  Delivery: Face-to-face     HPI  [name], a 58 y.o. year old female, is here today because of her No primary diagnosis found.. Ms. Nolt's primary complain today is Back Pain (Lumbar mid to left side )  Pertinent problems: Ms. Montrose has Obesity, Class III, BMI 40-49.9 (morbid obesity) (HCC); Dyslipidemia associated with type 2 diabetes mellitus (HCC); Chronic pain syndrome; Diabetic polyneuropathy associated with type 2 diabetes mellitus (HCC); and CAD (coronary artery disease) on their pertinent problem list. Pain Assessment: Severity of Chronic pain is reported as a 8 /10. Location: Back Lower, Left/sometimes down the left leg. Onset: More than a month ago. Quality: Discomfort, Sharp, Constant, Throbbing, Other (Comment) (throbbing, ice cold pain when the pain hits the foot). Timing: Constant. Modifying factor(s): positioning with pillows, medications. Vitals:  height is 5\' 6"  (1.676 m) and weight is 242 lb (109.8 kg). Her temporal temperature is 97.9 F (36.6 C). Her blood pressure is 139/85 and her pulse is 80. Her respiration is 16 and oxygen saturation is 99%.  BMI: Estimated body mass index is 39.06 kg/m as calculated from the  following:   Height as of this encounter: 5\' 6"  (1.676 m).   Weight as of this encounter: 242 lb (109.8 kg). Last encounter: 09/08/2022. Last procedure: 02/25/2022.  Reason for encounter: medication management.   No change in medical history since last visit.  Patient's pain is at baseline.  Patient continues multimodal pain regimen as prescribed.  States that it provides pain relief and improvement in functional status.   Pharmacotherapy Assessment  Analgesic: Butrans patch 7.5 mcg an hour  Monitoring: [place] PMP: PDMP reviewed during this encounter.       Pharmacotherapy: No side-effects or adverse reactions reported. Compliance: No problems identified. Effectiveness: Clinically acceptable.  Vernie Ammons, RN  02/18/2023  1:33 PM  Sign when Signing Visit Nursing Pain Medication Assessment:  Safety precautions to be maintained throughout the outpatient stay will include: orient to surroundings, keep bed in low position, maintain call bell within reach at all times, provide assistance with transfer out of bed and ambulation.  Medication Inspection Compliance: Ms. Avis did not comply with our request to bring her pills to be counted. She was reminded that bringing the medication bottles, even when empty, is a requirement.  Medication: None brought in. Pill/Patch Count: None available to be counted. Bottle Appearance: No container available. Did not bring bottle(s) to appointment. Filled Date: N/A Last Medication intake:   will replace this coming Saturday,  reports that she has one left   No results found for: "CBDTHCR" No results found for: "D8THCCBX" No results found for: "D9THCCBX"  UDS:  Summary  Date Value Ref Range Status  12/06/2018 Note  Final    Comment:    ==================================================================== Compliance Drug Analysis, Ur ==================================================================== Test  Result        Flag       Units Drug Present and Declared for Prescription Verification   Oxycodone                      62           EXPECTED   ng/mg creat   Oxymorphone                    73           EXPECTED   ng/mg creat   Noroxycodone                   322          EXPECTED   ng/mg creat   Noroxymorphone                 43           EXPECTED   ng/mg creat    Sources of oxycodone are scheduled prescription medications.    Oxymorphone, noroxycodone, and noroxymorphone are expected    metabolites of oxycodone. Oxymorphone is also available as a    scheduled prescription medication.   Gabapentin                     PRESENT      EXPECTED   Levetiracetam                  PRESENT      EXPECTED   Baclofen                       PRESENT      EXPECTED   Acetaminophen                  PRESENT      EXPECTED Drug Present not Declared for Prescription Verification   Morphine                       227          UNEXPECTED ng/mg creat    Potential sources of morphine include administration of codeine or    morphine, use of heroin, or ingestion of poppy seeds. Drug Absent but Declared for Prescription Verification   Butalbital                     Not Detected UNEXPECTED   Duloxetine                     Not Detected UNEXPECTED   Prochlorperazine               Not Detected UNEXPECTED ==================================================================== Test                      Result    Flag   Units      Ref Range   Creatinine              157              mg/dL      >=16 ==================================================================== Declared Medications:  The flagging and interpretation on this report are based on the  following declared medications.  Unexpected results may arise from  inaccuracies in the declared medications.  **Note: The testing scope of this panel includes these medications:  Baclofen  Butalbital (Fioricet)  Duloxetine (Cymbalta)  Gabapentin (  Neurontin)  Levetiracetam (Keppra)   Oxycodone (Percocet)  Prochlorperazine (Compazine)  **Note: The testing scope of this panel does not include small to  moderate amounts of these reported medications:  Acetaminophen (Fioricet)  Acetaminophen (Percocet)  **Note: The testing scope of this panel does not include the  following reported medications:  Albuterol (Ventolin HFA)  Amlodipine  Caffeine (Fioricet)  Calcium  Cimetidine (Tagamet)  Clopidogrel (Plavix)  Cyclosporine (Restasis)  Dulaglutide  Ezetimibe (Zetia)  Fluorometholone  Fluticasone (Advair)  Indeterminate Medication  Insulin (NovoLog)  Losartan (Cozaar)  Mirabegron (Myrbetriq)  Ondansetron (Zofran)  Pantoprazole (Protonix)  Polyethylene Glycol (MiraLAX)  Potassium (Klor-Con)  Prednisone (Deltasone)  Salmeterol (Advair)  Simvastatin (Zocor)  Sucralfate (Carafate)  Sulfamethoxazole (Bactrim)  Terconazole  Trimethoprim (Bactrim)  Vitamin D ==================================================================== For clinical consultation, please call [phone]. ====================================================================       ROS  Constitutional: Denies any fever or chills Gastrointestinal: No reported hemesis, hematochezia, vomiting, or acute GI distress Musculoskeletal: Denies any acute onset joint swelling, redness, loss of ROM, or weakness Neurological: No reported episodes of acute onset apraxia, aphasia, dysarthria, agnosia, amnesia, paralysis, loss of coordination, or loss of consciousness  Medication Review  BLOOD GLUCOSE TEST STRIPS, Blood Glucose Monitoring Suppl, DULoxetine, Dexcom G7 Receiver, Dexcom G7 Sensor, Dulaglutide, EPINEPHrine, FreeStyle Libre 3 Plus Sensor, FreeStyle Libre 3 Sensor, Glucagon, Lancets, Ubrogepant, albuterol, amLODipine, amitriptyline, blood glucose meter kit and supplies, buprenorphine, butalbital-acetaminophen-caffeine, carvedilol, cholecalciferol, clopidogrel, cycloSPORINE, ezetimibe,  fluticasone-salmeterol, furosemide, gabapentin, hydrALAZINE, insulin glargine (2 Unit Dial), insulin lispro, levETIRAcetam, meclizine, mirabegron ER, pantoprazole, polyethylene glycol, rosuvastatin, senna-docusate, and tiZANidine  History Review  Allergy: Ms. Huffstetler is allergic to codeine, farxiga [dapagliflozin], and lactose intolerance (gi). Drug: Ms. Kabler  reports no history of drug use. Alcohol:  reports current alcohol use of about 1.0 standard drink of alcohol per week. Tobacco:  reports that she quit smoking about 36 years ago. Her smoking use included cigarettes. She started smoking about 51 years ago. She has a 15 pack-year smoking history. She has never used smokeless tobacco. Social: Ms. Leeth  reports that she quit smoking about 36 years ago. Her smoking use included cigarettes. She started smoking about 51 years ago. She has a 15 pack-year smoking history. She has never used smokeless tobacco. She reports current alcohol use of about 1.0 standard drink of alcohol per week. She reports that she does not use drugs. Medical:  has a past medical history of Anxiety and depression (09/13/2006), Arthritis, Asthma, COPD (chronic obstructive pulmonary disease) (HCC), Depression, Diabetes mellitus, Diabetic neuropathy, painful (HCC), Diabetic retinopathy (HCC), Diastolic dysfunction, Dilated aortic root (HCC), Dizziness, Epilepsy idiopathic petit mal (HCC), GERD (gastroesophageal reflux disease), Glaucoma, Headache(784.0), Heart murmur, History of stress test, blood clots, Hyperlipidemia, Hypertension, Legally blind, MIGRAINE HEADACHE (09/14/2006), Myocardial infarction [facility]), Nephrolithiasis, Pain (09/23/2011), Pneumonia, Pyelonephritis, SBO (small bowel obstruction) (HCC) (04/23/2021), Seizures (HCC), Sleep apnea, Small bowel obstruction (HCC) (05/18/2021), Stress incontinence, and Stroke (HCC). Surgical: Ms. Posch  has a past surgical history that includes kidney stone removal; Brain hematoma  evacuation; Shunt removal; Retinal detachment surgery (Left, 1990); Vaginal hysterectomy (1996); Incisional hernia repair (05/05/2011); Mass excision (05/05/2011); PCNL; Cystoscopy/retrograde/ureteroscopy (08/24/2011); Cystoscopy with ureteroscopy (09/24/2011); Cystoscopy w/ retrogrades (09/24/2011); RIGHT/LEFT HEART CATH AND CORONARY ANGIOGRAPHY (N/A, 04/28/2019); and laparotomy (N/A, 06/12/2021). Family: family history includes ADD / ADHD in her maternal grandfather; Birth defects in her daughter and maternal grandfather; Brain cancer in her maternal grandmother; Cancer in her mother; Cirrhosis in her maternal grandfather; Diabetes in her maternal grandfather; Hypertension in her daughter, maternal  grandmother, and mother; Uterine cancer in her mother.  Laboratory Chemistry Profile   Renal Lab Results  Component Value Date   BUN 32 (H) 02/16/2023   CREATININE 1.31 (H) 02/16/2023   LABCREA 25 11/17/2021   BCR 24 (H) 02/16/2023   GFR 74.92 04/14/2022   GFRAA >60 05/30/2019   GFRNONAA >60 08/10/2022    Hepatic Lab Results  Component Value Date   AST 8 (L) 02/16/2023   ALT 10 02/16/2023   ALBUMIN 3.8 08/03/2022   ALKPHOS 99 08/03/2022   LIPASE 37 07/07/2021    Electrolytes Lab Results  Component Value Date   NA 137 02/16/2023   K 4.1 02/16/2023   CL 97 (L) 02/16/2023   CALCIUM 9.4 02/16/2023   MG 2.1 10/18/2021   PHOS 4.7 (H) 07/07/2021    Bone Lab Results  Component Value Date   VD25OH 33 11/17/2021    Inflammation (CRP: Acute Phase) (ESR: Chronic Phase) Lab Results  Component Value Date   CRP 16.7 (H) 06/14/2021   ESRSEDRATE 65 (H) 06/14/2021   LATICACIDVEN 1.6 08/03/2022         Note: Above Lab results reviewed.  Recent Imaging Review  CT ABDOMEN PELVIS W CONTRAST CLINICAL DATA:  Concern for bowel obstruction.  EXAM: CT ABDOMEN AND PELVIS WITH CONTRAST  TECHNIQUE: Multidetector CT imaging of the abdomen and pelvis was performed using the standard protocol following  bolus administration of intravenous contrast.  RADIATION DOSE REDUCTION: This exam was performed according to the departmental dose-optimization program which includes automated exposure control, adjustment of the mA and/or kV according to patient size and/or use of iterative reconstruction technique.  CONTRAST:  OMNIPAQUE IOHEXOL 350 MG/ML SOLN  COMPARISON:  CT abdomen pelvis dated 10/15/2021.  FINDINGS: Lower chest: Trace bilateral pleural effusions with minimal bibasilar subpleural atelectasis.  No intra-abdominal free air or free fluid.  Hepatobiliary: The liver is unremarkable. No biliary dilatation. The gallbladder is unremarkable.  Pancreas: Unremarkable. No pancreatic ductal dilatation or surrounding inflammatory changes.  Spleen: Normal in size without focal abnormality.  Adrenals/Urinary Tract: The left adrenal gland is unremarkable. Stable 2.5 cm right adrenal adenoma. Small nonobstructing bilateral renal calculi measure up to 6 mm in the inferior pole of the right kidney. There is no hydronephrosis on either side. There is symmetric enhancement and excretion of contrast by both kidneys. The visualized ureters and urinary bladder appear unremarkable.  Stomach/Bowel: There is sigmoid diverticulosis without active inflammatory changes. Multiple mildly dilated loops of proximal small bowel measure up to 4.5 cm no discrete transition noted. Findings favored to represent an ileus. Developing obstruction is not excluded. Small-bowel series may provide better evaluation. The appendix is normal.  Vascular/Lymphatic: The abdominal aorta and IVC are unremarkable. No portal venous gas. There is no adenopathy.  Reproductive: Hysterectomy.  No adnexal masses.  Other: Midline vertical anterior pelvic wall incisional scar. Small fat containing umbilical hernia.  Musculoskeletal: Degenerative changes of the spine. No acute osseous pathology.  IMPRESSION: 1. Mildly  dilated proximal small bowel favored to represent an ileus. Developing obstruction is not excluded. Small-bowel series may provide better evaluation. 2. Sigmoid diverticulosis. 3. Nonobstructing bilateral renal calculi. No hydronephrosis.  Electronically Signed   By: Elgie Collard M.D.   On: 08/08/2022 21:55 DG Abd 1 View CLINICAL DATA:  Abdominal pain and constipation, initial encounter  EXAM: ABDOMEN - 1 VIEW  COMPARISON:  10/18/2021  FINDINGS: Scattered large and small bowel gas is noted. A few mildly dilated loops of small bowel are noted  in the left mid abdomen. This may represent a partial small bowel obstruction or ileus. CT may be helpful for further evaluation. No free air is noted. No bony abnormality is seen.  IMPRESSION: Mildly dilated loops of small bowel on the left suspicious for ileus or early small bowel obstruction. CT may be helpful as clinically indicated.  Electronically Signed   By: Alcide Clever M.D.   On: 08/08/2022 20:24 Note: Reviewed        Physical Exam  General appearance: Well nourished, well developed, and well hydrated. In no apparent acute distress Mental status: Alert, oriented x 3 (person, place, & time)       Respiratory: No evidence of acute respiratory distress Eyes: PERLA Vitals: BP 139/85 (BP Location: Right Arm, Patient Position: Sitting, Cuff Size: Normal) Comment (Cuff Size): forearm  Pulse 80   Temp 97.9 F (36.6 C) (Temporal)   Resp 16   Ht 5\' 6"  (1.676 m)   Wt 242 lb (109.8 kg)   LMP  (LMP Unknown)   SpO2 99%   BMI 39.06 kg/m  BMI: Estimated body mass index is 39.06 kg/m as calculated from the following:   Height as of this encounter: 5\' 6"  (1.676 m).   Weight as of this encounter: 242 lb (109.8 kg). Ideal: Ideal body weight: 59.3 kg (130 lb 11.7 oz) Adjusted ideal body weight: 79.5 kg (175 lb 3.8 oz)  Assessment   Diagnosis Status  1. Chronic pain syndrome   2. Diabetic polyneuropathy associated with type 2  diabetes mellitus (HCC)   3. Central pain syndrome   4. Bilateral hip pain   5. Chronic migraine with aura and with status migrainosus, not intractable    Controlled Controlled Controlled     Plan of Care  Problem-specific:  No problem-specific Assessment & Plan notes found for this encounter.  Ms. Nela Bascom has a current medication list which includes the following long-term medication(s): albuterol, albuterol, amlodipine, duloxetine, ezetimibe, furosemide, toujeo max solostar, insulin lispro, levetiracetam, mirabegron er, pantoprazole, rosuvastatin, wixela inhub, amitriptyline, and gabapentin.  Pharmacotherapy (Medications Ordered): Meds ordered this encounter  Medications   buprenorphine (BUTRANS) 7.5 MCG/HR    Sig: Place 1 patch onto the skin once a week.    Dispense:  4 patch    Refill:  2    Chronic Pain: STOP Act (Not applicable) Fill 1 day early if closed on refill date. Avoid benzodiazepines within 8 hours of opioids   gabapentin (NEURONTIN) 600 MG tablet    Sig: 600 mg qAM, 600 mg qPM, 1200 mg QHS    Dispense:  120 tablet    Refill:  5   amitriptyline (ELAVIL) 25 MG tablet    Sig: Take 2-3 tablets (50-75 mg total) by mouth at bedtime.    Dispense:  90 tablet    Refill:  5   tiZANidine (ZANAFLEX) 4 MG tablet    Sig: Take 1 tablet (4 mg total) by mouth every 8 (eight) hours as needed for muscle spasms.    Dispense:  90 tablet    Refill:  2   Ubrogepant (UBRELVY) 50 MG TABS    Sig: 50 mg or 100 mg taken orally with or without food. If needed, a second dose may be taken at least 2 hours after the initial dose. The maximum dose in a 24-hour period is 200 mg    Dispense:  60 tablet    Refill:  2   Orders:  No orders of the defined types were  placed in this encounter.  Follow-up plan:   Return in about 3 months (around 05/19/2023) for MM, F2F.      Recent Visits No visits were found meeting these conditions. Showing recent visits within past 90 days  and meeting all other requirements Today's Visits Date Type Provider Dept  02/18/23 Office Visit Edward Jolly, MD [facility]  Showing today's visits and meeting all other requirements Future Appointments No visits were found meeting these conditions. Showing future appointments within next 90 days and meeting all other requirements  I discussed the assessment and treatment plan with the patient. The patient was provided an opportunity to ask questions and all were answered. The patient agreed with the plan and demonstrated an understanding of the instructions.  Patient advised to call back or seek an in-person evaluation if the symptoms or condition worsens.  Duration of encounter: .  Total time on encounter, as per AMA guidelines included both the face-to-face and non-face-to-face time personally spent by the physician and/or other qualified health care professional(s) on the day of the encounter (includes time in activities that require the physician or other qualified health care professional and does not include time in activities normally performed by clinical staff). Physician's time may include the following activities when performed: Preparing to see the patient (e.g., pre-charting review of records, searching for previously ordered imaging, lab work, and nerve conduction tests) Review of prior analgesic pharmacotherapies. Reviewing PMP Interpreting ordered tests (e.g., lab work, imaging, nerve conduction tests) Performing post-procedure evaluations, including interpretation of diagnostic procedures Obtaining and/or reviewing separately obtained history Performing a medically appropriate examination and/or evaluation Counseling and educating the patient/family/caregiver Ordering medications, tests, or procedures Referring and communicating with other health care professionals (when not separately reported) Documenting clinical information in the electronic or  other health record Independently interpreting results (not separately reported) and communicating results to the patient/ family/caregiver Care coordination (not separately reported)  Note by: Edward Jolly, MD Date: 02/18/2023; Time: 2:02 PM

## 2023-02-18 NOTE — Progress Notes (Signed)
Nursing Pain Medication Assessment:  Safety precautions to be maintained throughout the outpatient stay will include: orient to surroundings, keep bed in low position, maintain call bell within reach at all times, provide assistance with transfer out of bed and ambulation.  Medication Inspection Compliance: Ms. Matzen did not comply with our request to bring her pills to be counted. She was reminded that bringing the medication bottles, even when empty, is a requirement.  Medication: None brought in. Pill/Patch Count: None available to be counted. Bottle Appearance: No container available. Did not bring bottle(s) to appointment. Filled Date: N/A Last Medication intake:   will replace this coming Saturday,  reports that she has one left

## 2023-02-18 NOTE — Patient Instructions (Signed)
______________________________________________________________________    Preparing for your procedure  Appointments: If you think you may not be able to keep your appointment, call 24-48 hours in advance to cancel. We need time to make it available to others.  Procedure visits are for procedures only. During your procedure appointment there will be: NO Prescription Refills*. NO medication changes or discussions*. NO discussion of disability issues*. NO unrelated pain problem evaluations*. NO evaluations to order other pain procedures*. *These will be addressed at a separate and distinct evaluation encounter on the provider's evaluation schedule and not during procedure days.  Instructions: Food intake: Avoid eating anything solid for at least 8 hours prior to your procedure. Clear liquid intake: You may take clear liquids such as water up to 2 hours prior to your procedure. (No carbonated drinks. No soda.) Transportation: Unless otherwise stated by your physician, bring a driver. (Driver cannot be a Market researcher, Pharmacist, community, or any other form of public transportation.) Morning Medicines: Except for blood thinners, take all of your other morning medications with a sip of water. Make sure to take your heart and blood pressure medicines. If your blood pressure's lower number is above 100, the case will be rescheduled. Blood thinners: Make sure to stop your blood thinners as instructed.  If you take a blood thinner, but were not instructed to stop it, call our office [phone] and ask to talk to a nurse. Not stopping a blood thinner prior to certain procedures could lead to serious complications. Diabetics on insulin: Notify the staff so that you can be scheduled 1st case in the morning. If your diabetes requires high dose insulin, take only  of your normal insulin dose the morning of the procedure and notify the staff that you have done so. Preventing infections: Shower with an antibacterial soap the  morning of your procedure.  Build-up your immune system: Take 1000 mg of Vitamin C with every meal (3 times a day) the day prior to your procedure. Antibiotics: Inform the nursing staff if you are taking any antibiotics or if you have any conditions that may require antibiotics prior to procedures. (Example: recent joint implants)   Pregnancy: If you are pregnant make sure to notify the nursing staff. Not doing so may result in injury to the fetus, including death.  Sickness: If you have a cold, fever, or any active infections, call and cancel or reschedule your procedure. Receiving steroids while having an infection may result in complications. Arrival: You must be in the facility at least 30 minutes prior to your scheduled procedure. Tardiness: Your scheduled time is also the cutoff time. If you do not arrive at least 15 minutes prior to your procedure, you will be rescheduled.  Children: Do not bring any children with you. Make arrangements to keep them home. Dress appropriately: There is always a possibility that your clothing may get soiled. Avoid long dresses. Valuables: Do not bring any jewelry or valuables.  Reasons to call and reschedule or cancel your procedure: (Following these recommendations will minimize the risk of a serious complication.) Surgeries: Avoid having procedures within 2 weeks of any surgery. (Avoid for 2 weeks before or after any surgery). Flu Shots: Avoid having procedures within 2 weeks of a flu shots or . (Avoid for 2 weeks before or after immunizations). Barium: Avoid having a procedure within 7-10 days after having had a radiological study involving the use of radiological contrast. (Myelograms, Barium swallow or enema study). Heart attacks: Avoid any elective procedures or surgeries for the  initial 6 months after a "Myocardial Infarction" (Heart Attack). Blood thinners: It is imperative that you stop these medications before procedures. Let us know if you if you take  any blood thinner.  Infection: Avoid procedures during or within two weeks of an infection (including chest colds or gastrointestinal problems). Symptoms associated with infections include: Localized redness, fever, chills, night sweats or profuse sweating, burning sensation when voiding, cough, congestion, stuffiness, runny nose, sore throat, diarrhea, nausea, vomiting, cold or Flu symptoms, recent or current infections. It is specially important if the infection is over the area that we intend to treat. Heart and lung problems: Symptoms that may suggest an active cardiopulmonary problem include: cough, chest pain, breathing difficulties or shortness of breath, dizziness, ankle swelling, uncontrolled high or unusually low blood pressure, and/or palpitations. If you are experiencing any of these symptoms, cancel your procedure and contact your primary care physician for an evaluation.  Remember:  Regular Business hours are:  Monday to Thursday 8:00 AM to 4:00 PM  Provider's Schedule: Karen Metz, [name]:  Procedure days: Tuesday and Thursday 7:30 AM to 4:00 PM  Karen Jolly, [name]:  Procedure days: Monday and Wednesday 7:30 AM to 4:00 PM Last  Updated: 12/29/2022 ______________________________________________________________________

## 2023-02-22 ENCOUNTER — Ambulatory Visit: Payer: Self-pay | Admitting: *Deleted

## 2023-02-22 NOTE — Patient Outreach (Signed)
  Care Coordination   Follow Up Visit Note   02/22/2023 Name: [name] MRN: [id number] DOB: [birth year]  [name] is a 58 y.o. year old female who sees [name], [place] for primary care. I spoke with  [name] by phone today.  What matters to the patients health and wellness today?  Patient aware that her A1C remains elevated.  She has recently started seeing specialist, hoping this will help decrease A1C and better control DM.     Goals Addressed             This Visit's Progress    Management of chronic medical conditions (DM, HTN, CHF)   On track    Interventions Today    Flowsheet Row Most Recent Value  Chronic Disease   Chronic disease during today's visit Diabetes, Hypertension (HTN)  General Interventions   General Interventions Discussed/Reviewed General Interventions Reviewed, Labs, Doctor Visits, Communication with  Labs Hgb A1c every 3 months  [A1C increased to 11]  Doctor Visits Discussed/Reviewed Doctor Visits Reviewed, Annual Wellness Visits, PCP, Specialist  [Reviewed upcoming:  AWV on 3/13.  She does not have PCP scheduled, aware of need. Will call to schedule 4 week endocrinology follow up]  PCP/Specialist Visits Compliance with follow-up visit  [Seen by endocrinology on 1/30]  Communication with PCP/Specialists  [facility] sent to PCP inquiring about next appointment, will have office call patient directly]  Exercise Interventions   Exercise Discussed/Reviewed Physical Activity  Physical Activity Discussed/Reviewed Physical Activity Reviewed  [Discussed how increase exercise can help decrease blood sugars and A1c]  Education Interventions   Education Provided Provided Education  Provided Verbal Education On Nutrition, Labs, Blood Sugar Monitoring, When to see the doctor, Medication  [Meds reviewed, will continue daily glucose monitoring, range 114-217 over the past couple weeks]  Labs Reviewed Hgb A1c  [Aware of goal of A1C less  than 7]  Nutrition Interventions   Nutrition Discussed/Reviewed Carbohydrate meal planning, Adding fruits and vegetables, Nutrition Reviewed, Decreasing sugar intake  [Re-educated on proper diabetes diet, confirms she has started decreasing sugar/starch intake]  Pharmacy Interventions   Pharmacy Dicussed/Reviewed Affording Medications, Pharmacy Topics Reviewed  [Confirms she has all medications, cost is not an issue at this time]              SDOH assessments and interventions completed:  No     Care Coordination Interventions:  Yes, provided   Follow up plan: Follow up call scheduled for 3/3    Encounter Outcome:  Patient Visit Completed   Rodney Langton, RN, MSN, CCM [place]  [facility], [facility] RN Care Coordinator Direct Dial: [phone] / Main [phone] Fax [phone] Email: [email or web address] Website: [email or web address]

## 2023-02-22 NOTE — Patient Instructions (Signed)
Visit Information  Thank you for taking time to visit with me today. Please don't hesitate to contact me if I can be of assistance to you before our next scheduled telephone appointment.  Following are the goals we discussed today:  Monitor blood sugars daily. Call Endocrinology office to schedule 4 week follow up. Call PCP office to inquire about next office visit.  Continue following diabetic diet.   Our next appointment is by telephone on 3/3  Please call the care guide team at [phone] if you need to cancel or reschedule your appointment.   Please call the Suicide and Crisis Lifeline: 988 call the [place] National Suicide Prevention Lifeline: 401 753 1323 or TTY: (720)013-4040 TTY (330)497-3118) to talk to a trained counselor call 1-800-273-TALK (toll free, 24 hour hotline) call 911 if you are experiencing a Mental Health or Behavioral Health Crisis or need someone to talk to.  Patient verbalizes understanding of instructions and care plan provided today and agrees to view in MyChart. Active MyChart status and patient understanding of how to access instructions and care plan via MyChart confirmed with patient.     The patient has been provided with contact information for the care management team and has been advised to call with any health related questions or concerns.   Rodney Langton, RN, MSN, CCM [facility], [facility] RN Care Coordinator Direct Dial: [phone] / Main [phone] Fax [phone] Email: [email or web address]: [email or web address]

## 2023-03-10 ENCOUNTER — Telehealth: Payer: Self-pay

## 2023-03-10 DIAGNOSIS — Z8673 Personal history of transient ischemic attack (TIA), and cerebral infarction without residual deficits: Secondary | ICD-10-CM | POA: Diagnosis not present

## 2023-03-10 DIAGNOSIS — J449 Chronic obstructive pulmonary disease, unspecified: Secondary | ICD-10-CM | POA: Diagnosis not present

## 2023-03-10 DIAGNOSIS — I10 Essential (primary) hypertension: Secondary | ICD-10-CM | POA: Diagnosis not present

## 2023-03-10 DIAGNOSIS — G4733 Obstructive sleep apnea (adult) (pediatric): Secondary | ICD-10-CM | POA: Diagnosis not present

## 2023-03-10 NOTE — Telephone Encounter (Signed)
 Patient was identified as falling into the [place] Measure - Diabetes.   Patient was: Appointment scheduled with primary care provider in the next 30 days.

## 2023-03-15 ENCOUNTER — Ambulatory Visit (INDEPENDENT_AMBULATORY_CARE_PROVIDER_SITE_OTHER): Payer: 59 | Admitting: Family Medicine

## 2023-03-15 ENCOUNTER — Encounter: Payer: Self-pay | Admitting: Family Medicine

## 2023-03-15 VITALS — BP 118/78 | HR 90 | Resp 16 | Ht 66.0 in | Wt 245.0 lb

## 2023-03-15 DIAGNOSIS — E1165 Type 2 diabetes mellitus with hyperglycemia: Secondary | ICD-10-CM

## 2023-03-15 DIAGNOSIS — I5032 Chronic diastolic (congestive) heart failure: Secondary | ICD-10-CM

## 2023-03-15 DIAGNOSIS — E1169 Type 2 diabetes mellitus with other specified complication: Secondary | ICD-10-CM

## 2023-03-15 DIAGNOSIS — R569 Unspecified convulsions: Secondary | ICD-10-CM | POA: Diagnosis not present

## 2023-03-15 DIAGNOSIS — G894 Chronic pain syndrome: Secondary | ICD-10-CM | POA: Diagnosis not present

## 2023-03-15 DIAGNOSIS — I69354 Hemiplegia and hemiparesis following cerebral infarction affecting left non-dominant side: Secondary | ICD-10-CM

## 2023-03-15 DIAGNOSIS — N179 Acute kidney failure, unspecified: Secondary | ICD-10-CM | POA: Diagnosis not present

## 2023-03-15 DIAGNOSIS — Z6839 Body mass index (BMI) 39.0-39.9, adult: Secondary | ICD-10-CM

## 2023-03-15 DIAGNOSIS — K219 Gastro-esophageal reflux disease without esophagitis: Secondary | ICD-10-CM

## 2023-03-15 DIAGNOSIS — E66812 Obesity, class 2: Secondary | ICD-10-CM | POA: Insufficient documentation

## 2023-03-15 DIAGNOSIS — F331 Major depressive disorder, recurrent, moderate: Secondary | ICD-10-CM

## 2023-03-15 DIAGNOSIS — I10 Essential (primary) hypertension: Secondary | ICD-10-CM

## 2023-03-15 MED ORDER — PANTOPRAZOLE SODIUM 40 MG PO TBEC: 40.0000 mg | DELAYED_RELEASE_TABLET | Freq: Every day | ORAL | 1 refills | Status: DC | PRN

## 2023-03-15 NOTE — Progress Notes (Unsigned)
 Name: [name]   MRN: [id number]    DOB: 11/26/1965   Date:03/15/2023       Progress Note  Chief Complaint  Patient presents with  . Diabetes     Subjective:   [name] is a 58 y.o. female, presents to clinic for routine follow up on chronic conditions  Endocrinology - restarted her on truliticy, toujeo 20 units once daily and mealtime insulin SS Using freestyle libre 3  Lab Results  Component Value Date   HGBA1C 11.0 (A) 02/04/2023   Cardiology managing heart/BP meds He has refilled all meds per pt - Dr. Juliann Pares  olmesartan-hydrochlorothiazide (BENICAR HCT) 40-25 mg tablet, amlodipine, carvedilol, spironolactone, furosemide 20 daily, hydralazine  (multiple meds on care everywhere chart OV notes and chart summary - duplicate olmesartan?  Will need to contact cardiology or pharmacy to clarify her current meds   On crestor 10 and zetia for HLD with hx of stroke Lab Results  Component Value Date   CHOL 129 02/16/2023   HDL 45 (L) 02/16/2023   LDLCALC 60 02/16/2023   TRIG 166 (H) 02/16/2023   CHOLHDL 2.9 02/16/2023        Current Outpatient Medications:  .  albuterol (ACCUNEB) 1.25 MG/3ML nebulizer solution, Take 3 mLs (1.25 mg total) by nebulization 2 (two) times daily., Disp: 75 mL, Rfl: 0 .  albuterol (VENTOLIN HFA) 108 (90 Base) MCG/ACT inhaler, Inhale 1-2 puffs into the lungs every 6 (six) hours as needed., Disp: 18 g, Rfl: 0 .  amitriptyline (ELAVIL) 25 MG tablet, Take 2-3 tablets (50-75 mg total) by mouth at bedtime., Disp: 90 tablet, Rfl: 5 .  amLODipine (NORVASC) 10 MG tablet, TAKE 1 TABLET BY MOUTH DAILY, Disp: 90 tablet, Rfl: 1 .  buprenorphine (BUTRANS) 7.5 MCG/HR, Place 1 patch onto the skin once a week., Disp: 4 patch, Rfl: 2 .  butalbital-acetaminophen-caffeine (FIORICET) 50-325-40 MG tablet, Take 1-2 tablets by mouth every 6 (six) hours as needed for headache., Disp: 10 tablet, Rfl: 0 .  carvedilol (COREG) 25 MG tablet, Take 25 mg  by mouth 2 (two) times daily., Disp: , Rfl:  .  cholecalciferol (VITAMIN D3) 25 MCG (1000 UNIT) tablet, Take 1 tablet (1,000 Units total) by mouth daily., Disp: 90 tablet, Rfl: 1 .  clopidogrel (PLAVIX) 75 MG tablet, Take 1 tablet (75 mg total) by mouth daily., Disp: 90 tablet, Rfl: 0 .  Continuous Glucose Sensor (FREESTYLE LIBRE 3 PLUS SENSOR) MISC, Inject 1 Device into the skin continuous. Change every 14 days, Disp: 6 each, Rfl: 3 .  Continuous Glucose Sensor (FREESTYLE LIBRE 3 SENSOR) MISC, 1 Device by Does not apply route continuous., Disp: 6 each, Rfl: 3 .  cycloSPORINE (RESTASIS) 0.05 % ophthalmic emulsion, Place 2 drops into both eyes 2 (two) times daily., Disp: , Rfl:  .  Dulaglutide (TRULICITY) 3 MG/0.5ML SOAJ, Inject 3 mg as directed once a week., Disp: 6 mL, Rfl: 1 .  DULoxetine (CYMBALTA) 60 MG capsule, TAKE 1 CAPSULE BY MOUTH EVERY DAY, Disp: 90 capsule, Rfl: 0 .  EPINEPHrine 0.3 mg/0.3 mL IJ SOAJ injection, Inject 0.3 mg into the muscle as needed for anaphylaxis., Disp: 1 each, Rfl: 0 .  ezetimibe (ZETIA) 10 MG tablet, Take 1 tablet (10 mg total) by mouth daily., Disp: 90 tablet, Rfl: 3 .  furosemide (LASIX) 20 MG tablet, TAKE 1 TABLET BY MOUTH DAILY, Disp: 90 tablet, Rfl: 1 .  gabapentin (NEURONTIN) 600 MG tablet, 600 mg qAM, 600 mg qPM, 1200 mg  QHS, Disp: 120 tablet, Rfl: 5 .  Glucagon (GVOKE HYPOPEN 2-PACK) 1 MG/0.2ML SOAJ, INJECT 1 MG INTO THE SKIN ONCE AS NEEDED FOR UP TO 1 DOSE (HYPOGLYCEMIA). ONLY FOR SEVERE LOW BLOOD SUGAR NOT RESPONDING TO DRINKING JUICE/GLUCOSE TABLETS, WHEN CBG IS <70, Disp: 0.4 mL, Rfl: 2 .  hydrALAZINE (APRESOLINE) 50 MG tablet, Take 1 tablet by mouth 3 (three) times daily., Disp: , Rfl:  .  insulin glargine, 2 Unit Dial, (TOUJEO MAX SOLOSTAR) 300 UNIT/ML Solostar Pen, 20 units in am (Patient taking differently: 33 Units at bedtime. 20 units in am), Disp: 15 mL, Rfl: 1 .  insulin lispro (HUMALOG KWIKPEN) 100 UNIT/ML KwikPen, INJECT SUBCUTANEOUSLY UP TO 7   UNITS BEFORE EACH MEAL (Patient taking differently: 9 Units 3 (three) times daily. 9u in the a.m.  13u lunchtime 9 u at bedtime.  Sliding scale), Disp: 30 mL, Rfl: 2 .  levETIRAcetam (KEPPRA) 500 MG tablet, Take 500-750 mg by mouth See admin instructions. Take 1 tablet (500mg ) by mouth every morning and take 1 tablets by mouth (750mg ) by mouth every night, Disp: , Rfl:  .  meclizine (ANTIVERT) 25 MG tablet, Take 1 tablet (25 mg total) by mouth 3 (three) times daily as needed for dizziness., Disp: 30 tablet, Rfl: 0 .  mirabegron ER (MYRBETRIQ) 50 MG TB24 tablet, Take 1 tablet (50 mg total) by mouth daily., Disp: 90 tablet, Rfl: 3 .  olmesartan-hydrochlorothiazide (BENICAR HCT) 40-25 MG tablet, Take 1 tablet by mouth daily., Disp: , Rfl:  .  polyethylene glycol (MIRALAX / GLYCOLAX) 17 g packet, Take 17 g by mouth daily as needed for mild constipation., Disp: 100 each, Rfl: 3 .  rosuvastatin (CRESTOR) 40 MG tablet, TAKE 1 TABLET BY MOUTH DAILY, Disp: 100 tablet, Rfl: 2 .  senna-docusate (SENOKOT-S) 8.6-50 MG tablet, Take 1 tablet by mouth at bedtime as needed for mild constipation., Disp: 60 tablet, Rfl: 0 .  spironolactone (ALDACTONE) 25 MG tablet, Take 25 mg by mouth daily., Disp: , Rfl:  .  tiZANidine (ZANAFLEX) 4 MG tablet, Take 1 tablet (4 mg total) by mouth every 8 (eight) hours as needed for muscle spasms., Disp: 90 tablet, Rfl: 2 .  Ubrogepant (UBRELVY) 50 MG TABS, 50 mg or 100 mg taken orally with or without food. If needed, a second dose may be taken at least 2 hours after the initial dose. The maximum dose in a 24-hour period is 200 mg, Disp: 60 tablet, Rfl: 2 .  WIXELA INHUB 250-50 MCG/DOSE AEPB, Inhale 1 puff into the lungs 2 (two) times daily., Disp: , Rfl:  .  pantoprazole (PROTONIX) 40 MG tablet, Take 1 tablet (40 mg total) by mouth daily as needed., Disp: 100 tablet, Rfl: 1  Patient Active Problem List   Diagnosis Date Noted  . Abdominal distension 08/08/2022  . Lower urinary tract  infectious disease 08/05/2022  . Hyperglycemia 07/20/2022  . Uncontrolled type 2 diabetes mellitus with hyperglycemia (HCC) 11/17/2021  . Insulin dependent type 2 diabetes mellitus (HCC) 11/17/2021  . Acute kidney injury (HCC) 10/15/2021  . Mild episode of recurrent major depressive disorder (HCC) 08/14/2021  . Hypoglycemia 07/07/2021  . UTI (urinary tract infection) 07/07/2021  . Syncope 07/07/2021  . Diarrhea 06/09/2021  . Hyperkalemia 06/09/2021  . Intractable nausea and vomiting 06/08/2021  . Orthostatic hypotension 04/29/2021  . Constipation 04/28/2021  . Hypertensive urgency 04/23/2021  . GERD without esophagitis 04/23/2021  . AKI (acute kidney injury) (HCC) 04/23/2021  . Dyslipidemia 04/23/2021  . Chronic diastolic  CHF (congestive heart failure) (HCC) 04/23/2021  . Cervical spondylosis 02/12/2021  . Lumbar facet arthropathy 02/12/2021  . Major depressive disorder, recurrent episode, moderate (HCC) 11/13/2020  . Adrenal adenoma, right   . PAD (peripheral artery disease) (HCC) 01/24/2020  . Abnormal ankle brachial index (ABI) 12/18/2019  . CAD (coronary artery disease) 04/27/2019  . Diabetic polyneuropathy associated with type 2 diabetes mellitus (HCC) 03/02/2018  . Vitamin D deficiency 01/24/2018  . Overactive bladder 05/18/2017  . Dilated aortic root (HCC)   . Sacroiliac dysfunction 10/09/2014  . Chronic low back pain 09/03/2014  . Hemiparesis affecting left side as late effect of cerebrovascular accident (CVA) (HCC) 09/03/2014  . OSA (obstructive sleep apnea) 05/15/2014  . Dyslipidemia associated with type 2 diabetes mellitus (HCC) 01/21/2014  . Essential hypertension, benign 01/21/2014  . Chronic pain syndrome 01/21/2014  . Headache disorder 09/07/2013  . Mixed urge and stress incontinence 08/25/2011  . Ureteral stricture, right 08/25/2011  . Nephrolithiasis 08/22/2011  . Incisional hernia 05/10/2011  . Obesity, Class III, BMI 40-49.9 (morbid obesity) (HCC)  05/10/2011  . Lightheadedness 10/12/2006  . BLINDNESS, LEGAL, [place] DEFINITION 09/14/2006  . Seizure (HCC) 09/13/2006    Past Surgical History:  Procedure Laterality Date  . BRAIN HEMATOMA EVACUATION     five procedures total, first procedure when 50 months old, last at 58 years of age.  . CYSTOSCOPY W/ RETROGRADES  09/24/2011   Procedure: CYSTOSCOPY WITH RETROGRADE PYELOGRAM;  Surgeon: Anner Crete, MD;  Location: [facility];  Service: Urology;  Laterality: Right;  . CYSTOSCOPY WITH URETEROSCOPY  09/24/2011   Procedure: CYSTOSCOPY WITH URETEROSCOPY;  Surgeon: Anner Crete, MD;  Location: [facility];  Service: Urology;  Laterality: Right;  Balloon dilation right ureter   . CYSTOSCOPY/RETROGRADE/URETEROSCOPY  08/24/2011   Procedure: CYSTOSCOPY/RETROGRADE/URETEROSCOPY;  Surgeon: Milford Cage, MD;  Location: [facility];  Service: Urology;  Laterality: Right;  Cysto, Right retrograde Pyelogram, right stent placement.   Sherald Hess HERNIA REPAIR  05/05/2011   Procedure: LAPAROSCOPIC INCISIONAL HERNIA;  Surgeon: Emelia Loron, MD;  Location: [facility];  Service: General;  Laterality: N/A;  . kidney stone removal    . LAPAROTOMY N/A 06/12/2021   Procedure: EXPLORATORY LAPAROTOMY, LYSIS OF ADHESIONS;  Surgeon: Henrene Dodge, MD;  Location: [facility];  Service: General;  Laterality: N/A;  . MASS EXCISION  05/05/2011   Procedure: EXCISION MASS;  Surgeon: Emelia Loron, MD;  Location: [facility] OR;  Service: General;  Laterality: Right;  . PCNL    . RETINAL DETACHMENT SURGERY Left 1990  . RIGHT/LEFT HEART CATH AND CORONARY ANGIOGRAPHY N/A 04/28/2019   Procedure: RIGHT/LEFT HEART CATH AND CORONARY ANGIOGRAPHY;  Surgeon: Alwyn Pea, MD;  Location: [facility] INVASIVE CV LAB;  Service: Cardiovascular;  Laterality: N/A;  . SHUNT REMOVAL     shunt inserted at age 61 removed at age 54   . VAGINAL HYSTERECTOMY  1996    Family History  Problem Relation Age of Onset  . Uterine cancer Mother   . Hypertension Mother   .  Cancer Mother   . Brain cancer Maternal Grandmother   . Hypertension Maternal Grandmother   . Birth defects Daughter   . Hypertension Daughter   . Cirrhosis Maternal Grandfather   . ADD / ADHD Maternal Grandfather   . Birth defects Maternal Grandfather   . Diabetes Maternal Grandfather   . Anesthesia problems Neg Hx   . Colon cancer Neg Hx   . Esophageal cancer Neg Hx   . Pancreatic cancer Neg Hx  Social History   Tobacco Use  . Smoking status: Former    Current packs/day: 0.00    Average packs/day: 1 pack/day for 15.0 years (15.0 ttl pk-yrs)    Types: Cigarettes    Start date: 01/20/1972    Quit date: 01/20/1987    Years since quitting: 36.1  . Smokeless tobacco: Never  . Tobacco comments:    quit more than 20 years  Vaping Use  . Vaping status: Never Used  Substance Use Topics  . Alcohol use: Yes    Alcohol/week: 1.0 standard drink of alcohol    Types: 1 Glasses of wine per week  . Drug use: No    Comment: hx of marijuana use no longer uses      Allergies  Allergen Reactions  . Codeine Swelling and Other (See Comments)    Swelling and burning of mouth (inside)  . Marcelline Deist [Dapagliflozin] Other (See Comments)    Recurrent UTI with farxiga  . Lactose Intolerance (Gi) Nausea And Vomiting    Health Maintenance  Topic Date Due  . MAMMOGRAM  08/12/2017  . Cervical Cancer Screening (Pap smear)  06/23/2021  . Cervical Cancer Screening (HPV/Pap Cotest)  06/23/2021  . Medicare Annual Wellness (AWV)  03/26/2023  . OPHTHALMOLOGY EXAM  03/15/2023 (Originally 09/18/2022)  . COVID-19 Vaccine (4 - 2024-25 season) 03/31/2023 (Originally 09/20/2022)  . INFLUENZA VACCINE  04/19/2023 (Originally 08/20/2022)  . Zoster Vaccines- Shingrix (1 of 2) 06/12/2023 (Originally 09/22/2014)  . Diabetic kidney evaluation - Urine ACR  04/16/2023  . HEMOGLOBIN A1C  08/04/2023  . FOOT EXAM  12/13/2023  . Diabetic kidney evaluation - eGFR measurement  02/16/2024  . Colonoscopy  01/20/2027  .  DTaP/Tdap/Td (2 - Td or Tdap) 06/22/2028  . Pneumococcal Vaccine 94-64 Years old (3 of 3 - PPSV23 or PCV20) 09/21/2029  . Hepatitis C Screening  Completed  . HIV Screening  Completed  . HPV VACCINES  Aged Out    Chart Review Today: ***  Review of Systems   Objective:   Vitals:   03/15/23 1454  BP: 118/78  Pulse: 90  Resp: 16  SpO2: 98%  Weight: 245 lb (111.1 kg)  Height: 5\' 6"  (1.676 m)    Body mass index is 39.54 kg/m.  Physical Exam   Functional Status Survey:   Results for orders placed or performed in visit on 02/16/23  Lipid panel   Collection Time: 02/16/23  9:28 AM  Result Value Ref Range   Cholesterol 129 <200 mg/dL   HDL 45 (L) > OR = 50 mg/dL   Triglycerides 161 (H) <150 mg/dL   LDL Cholesterol (Calc) 60 mg/dL (calc)   Total CHOL/HDL Ratio 2.9 <5.0 (calc)   Non-HDL Cholesterol (Calc) 84 <096 mg/dL (calc)  Comprehensive metabolic panel   Collection Time: 02/16/23  9:28 AM  Result Value Ref Range   Glucose, Bld 214 (H) 65 - 99 mg/dL   BUN 32 (H) 7 - 25 mg/dL   Creat 0.45 (H) 4.09 - 1.03 mg/dL   BUN/Creatinine Ratio 24 (H) 6 - 22 (calc)   Sodium 137 135 - 146 mmol/L   Potassium 4.1 3.5 - 5.3 mmol/L   Chloride 97 (L) 98 - 110 mmol/L   CO2 30 20 - 32 mmol/L   Calcium 9.4 8.6 - 10.4 mg/dL   Total Protein 7.1 6.1 - 8.1 g/dL   Albumin 4.1 3.6 - 5.1 g/dL   Globulin 3.0 1.9 - 3.7 g/dL (calc)   AG Ratio 1.4 1.0 - 2.5 (  calc)   Total Bilirubin 0.3 0.2 - 1.2 mg/dL   Alkaline phosphatase (APISO) 92 37 - 153 U/L   AST 8 (L) 10 - 35 U/L   ALT 10 6 - 29 U/L  C-peptide   Collection Time: 02/16/23  9:28 AM  Result Value Ref Range   C-Peptide 3.61 0.80 - 3.85 ng/mL   *Note: Due to a large number of results and/or encounters for the requested time period, some results have not been displayed. A complete set of results can be found in Results Review.      Assessment & Plan:   There are no diagnoses linked to this encounter.   No follow-ups on file.    [name], [name] 03/15/23 3:05 PM

## 2023-03-16 ENCOUNTER — Encounter: Payer: Self-pay | Admitting: Family Medicine

## 2023-03-16 LAB — BASIC METABOLIC PANEL WITH GFR
BUN/Creatinine Ratio: 13 (calc) (ref 6–22)
BUN: 14 mg/dL (ref 7–25)
CO2: 33 mmol/L — ABNORMAL HIGH (ref 20–32)
Calcium: 9.9 mg/dL (ref 8.6–10.4)
Chloride: 96 mmol/L — ABNORMAL LOW (ref 98–110)
Creat: 1.07 mg/dL — ABNORMAL HIGH (ref 0.50–1.03)
Glucose, Bld: 79 mg/dL (ref 65–99)
Potassium: 4 mmol/L (ref 3.5–5.3)
Sodium: 140 mmol/L (ref 135–146)
eGFR: 60 mL/min/{1.73_m2} (ref >=60)

## 2023-03-17 ENCOUNTER — Other Ambulatory Visit: Payer: Self-pay

## 2023-03-17 ENCOUNTER — Encounter: Payer: Self-pay | Admitting: "Endocrinology

## 2023-03-17 DIAGNOSIS — E119 Type 2 diabetes mellitus without complications: Secondary | ICD-10-CM

## 2023-03-17 DIAGNOSIS — E1165 Type 2 diabetes mellitus with hyperglycemia: Secondary | ICD-10-CM

## 2023-03-17 DIAGNOSIS — E162 Hypoglycemia, unspecified: Secondary | ICD-10-CM

## 2023-03-17 MED ORDER — GVOKE HYPOPEN 2-PACK 1 MG/0.2ML ~~LOC~~ SOAJ
1.0000 mg | Freq: Once | SUBCUTANEOUS | 2 refills | Status: DC | PRN
Start: 2023-03-17 — End: 2023-05-18

## 2023-03-19 NOTE — Assessment & Plan Note (Signed)
 Pain management pt

## 2023-03-19 NOTE — Assessment & Plan Note (Signed)
 Recheck renal function, last labs eGFR decreased slightly and she is concerned with kidney health since in hospital AKI

## 2023-03-19 NOTE — Assessment & Plan Note (Signed)
 PPI refilled, take breaks or wean off if able, can use OTC pepcid as well for sx management

## 2023-03-19 NOTE — Assessment & Plan Note (Signed)
 Wt Readings from Last 8 Encounters:  03/15/23 245 lb (111.1 kg)  02/18/23 242 lb (109.8 kg)  02/16/23 248 lb (112.5 kg)  02/04/23 246 lb 3.2 oz (111.7 kg)  10/27/22 240 lb (108.9 kg)  09/11/22 243 lb 9.6 oz (110.5 kg)  09/08/22 247 lb (112 kg)  09/07/22 247 lb (112 kg)  Weight/bmi relatively stable BMI Readings from Last 5 Encounters:  03/15/23 39.54 kg/m  02/18/23 39.06 kg/m  02/16/23 40.03 kg/m  02/04/23 39.74 kg/m  10/27/22 38.74 kg/m   Multiple associated comorbidities - including but not limited to uncontrolled IDDM, HTN, HLD, CAD, CHF, PAD, chronic pain syndromes, hx of stroke

## 2023-03-19 NOTE — Assessment & Plan Note (Signed)
 On crestor 40 mg daily Lab Results  Component Value Date   CHOL 129 02/16/2023   HDL 45 (L) 02/16/2023   LDLCALC 60 02/16/2023   TRIG 166 (H) 02/16/2023   CHOLHDL 2.9 02/16/2023  Last LDL at goal

## 2023-03-19 NOTE — Assessment & Plan Note (Signed)
 BP at goal today, managed mostly by specialists in outside system Last OV note and med lists are contradictory, we will need to clarify medications - consult pharmacy On norvasc, hydralazine, olmesartan-hydrochlorothiazide or just olmesartan?, spironolactone

## 2023-03-19 NOTE — Assessment & Plan Note (Signed)
 Per cardiology

## 2023-03-19 NOTE — Assessment & Plan Note (Signed)
 Uncontrolled, managed by endocrinology Trulicity dose just increased Insulin per endo and also on jardiance Lab Results  Component Value Date   HGBA1C 11.0 (A) 02/04/2023

## 2023-03-19 NOTE — Assessment & Plan Note (Signed)
 Managed by specialists on amitriptyline and cymbalta

## 2023-03-19 NOTE — Assessment & Plan Note (Signed)
>>  ASSESSMENT AND PLAN FOR MAJOR DEPRESSIVE DISORDER, RECURRENT EPISODE, MODERATE (HCC) WRITTEN ON 03/19/2023  4:47 PM BY Malak Duchesneau, [name]  Managed by specialists on amitriptyline  and cymbalta

## 2023-03-19 NOTE — Assessment & Plan Note (Signed)
>>  ASSESSMENT AND PLAN FOR UNCONTROLLED TYPE 2 DIABETES MELLITUS WITH HYPERGLYCEMIA (HCC) WRITTEN ON 03/19/2023  4:45 PM BY Carrie Schoonmaker, [name]  Uncontrolled, managed by endocrinology Trulicity  dose just increased Insulin  per endo and also on jardiance Lab Results  Component Value Date   HGBA1C 11.0 (A) 02/04/2023

## 2023-03-19 NOTE — Assessment & Plan Note (Signed)
 Managed by neurology, no recent seizure activity or med changes

## 2023-03-19 NOTE — Assessment & Plan Note (Signed)
>>  ASSESSMENT AND PLAN FOR SEIZURE (HCC) WRITTEN ON 03/19/2023  4:40 PM BY Tagan Bartram, [name]  Managed by neurology, no recent seizure activity or med changes

## 2023-03-19 NOTE — Assessment & Plan Note (Signed)
 No change to deficit/sx On statin and plavix

## 2023-03-22 ENCOUNTER — Ambulatory Visit: Payer: Self-pay | Admitting: *Deleted

## 2023-03-22 NOTE — Patient Outreach (Signed)
 Care Coordination   Follow Up Visit Note   03/22/2023 Name: [name] MRN: [id number] DOB: 1965-05-12  [name] is a 58 y.o. year old female who sees [name], [place] for primary care. I spoke with  [name] by phone today.  What matters to the patients health and wellness today?  Patient feels she is doing better, will need new A1C to have a better idea of how well condition is managed.  Denies any urgent concerns, encouraged to contact this care manager with questions.      Goals Addressed             This Visit's Progress    Management of chronic medical conditions (DM, HTN, CHF)   On track    Interventions Today    Flowsheet Row Most Recent Value  Chronic Disease   Chronic disease during today's visit Congestive Heart Failure (CHF), Diabetes  General Interventions   General Interventions Discussed/Reviewed General Interventions Reviewed, Doctor Visits, Annual Eye Exam, Annual Foot Exam, Labs  Labs Hgb A1c every 3 months  [Will have repeat labs with next Endo visit]  Doctor Visits Discussed/Reviewed Annual Wellness Visits, Doctor Visits Reviewed, Specialist, PCP  [Upcoming AWV on 3/13 and endocrinology 4/3]  PCP/Specialist Visits Compliance with follow-up visit  Education Interventions   Education Provided Provided Education  Provided Verbal Education On Blood Sugar Monitoring, Nutrition, Exercise, Medication, When to see the doctor  [facility] reviewed, report compliance. State blood sugar better, range 140-200, discussed changing diet and increasing exercise to help with management]              SDOH assessments and interventions completed:  No     Care Coordination Interventions:  Yes, provided   Follow up plan: Follow up call scheduled for 4/7    Encounter Outcome:  Patient Visit Completed   Rodney Langton, RN, MSN, CCM [place]  [facility], [facility] RN Care Coordinator Direct Dial: [phone]  / Main [phone] Fax [phone] Email: [email or web address]: [email or web address]

## 2023-03-23 ENCOUNTER — Telehealth: Payer: Self-pay

## 2023-03-23 NOTE — Telephone Encounter (Signed)
 Patient was identified as falling into the True [place] Measure - Diabetes.   Patient was: Appointment scheduled for lab or office visit for A1c. See's specialist

## 2023-03-27 ENCOUNTER — Other Ambulatory Visit: Payer: Self-pay | Admitting: Physician Assistant

## 2023-03-27 ENCOUNTER — Other Ambulatory Visit: Payer: Self-pay | Admitting: "Endocrinology

## 2023-03-28 ENCOUNTER — Encounter: Payer: Self-pay | Admitting: "Endocrinology

## 2023-03-29 ENCOUNTER — Other Ambulatory Visit: Payer: Self-pay | Admitting: Physician Assistant

## 2023-03-29 ENCOUNTER — Other Ambulatory Visit: Payer: Self-pay

## 2023-03-29 ENCOUNTER — Other Ambulatory Visit: Payer: Self-pay | Admitting: Pharmacist

## 2023-03-29 ENCOUNTER — Telehealth: Payer: Self-pay | Admitting: Pharmacist

## 2023-03-29 ENCOUNTER — Other Ambulatory Visit: Payer: Self-pay | Admitting: "Endocrinology

## 2023-03-29 ENCOUNTER — Telehealth: Payer: Self-pay | Admitting: *Deleted

## 2023-03-29 DIAGNOSIS — E1165 Type 2 diabetes mellitus with hyperglycemia: Secondary | ICD-10-CM

## 2023-03-29 MED ORDER — INSULIN LISPRO (1 UNIT DIAL) 100 UNIT/ML (KWIKPEN): 9.0000 [IU] | PEN_INJECTOR | Freq: Three times a day (TID) | SUBCUTANEOUS | 3 refills | Status: DC

## 2023-03-29 MED ORDER — TOUJEO MAX SOLOSTAR 300 UNIT/ML ~~LOC~~ SOPN
33.0000 [IU] | PEN_INJECTOR | Freq: Every evening | SUBCUTANEOUS | 3 refills | Status: DC
Start: 2023-03-29 — End: 2023-06-15

## 2023-03-29 NOTE — Progress Notes (Signed)
   03/29/2023  Patient ID: [name], female   DOB: [birth year], 58 y.o.   MRN: [id number]  Receive a referral from PCP requesting outreach to patient for assistance with diabetes management and complex medication management.   Patient appearing on report for [place] Metric - Diabetes Control report due to last documented A1C of 11% on 02/04/2023. Next appointment with PCP is 09/13/2023. Note patient followed by [facility], with next appointment scheduled for 04/22/2023.  Reach patient by telephone today. Schedule appointment to complete comprehensive medication review on 04/21/2023 at 8:00 AM   During our call, patient shares that she has 1 pen of her Toujeo insulin remaining and that she is unable to order a refill of [facility] before she will run out as current prescription reflects her old dosing. - From review of chart, note at Office Visit on 02/16/2023, Dr. Roosevelt Locks advised patient to increase to "Toujeo 33 units daily in a.m"  Collaborate with Endocrinologist and provider sends updated prescription to pharmacy for patient today.  Estelle Grumbles, PharmD, [facility] [phone]

## 2023-03-29 NOTE — Telephone Encounter (Signed)
 Requested Prescriptions  Pending Prescriptions Disp Refills   albuterol (VENTOLIN HFA) 108 (90 Base) MCG/ACT inhaler [Pharmacy Med Name: ALBUTEROL HFA 90MCG/ACT (PA)] 18 g 0    Sig: USE 1 TO 2 INHALATIONS BY MOUTH  EVERY 6 HOURS AS NEEDED     Pulmonology:  Beta Agonists 2 Passed - 03/29/2023  3:59 PM      Passed - Last BP in normal range    BP Readings from Last 1 Encounters:  03/15/23 118/78         Passed - Last Heart Rate in normal range    Pulse Readings from Last 1 Encounters:  03/15/23 90         Passed - Valid encounter within last 12 months    Recent Outpatient Visits           6 months ago Bilateral hip pain   [facility] Margarita Mail, DO   7 months ago Acute cystitis without hematuria   [facility] Berniece Salines, [name]   9 months ago AKI (acute kidney injury) [facility])   [place] [facility] [name], [name]   1 year ago Uncontrolled type 2 diabetes mellitus with hyperglycemia [facility])   [place] [facility] [name], [name]   1 year ago Uncontrolled type 2 diabetes mellitus with hyperglycemia [facility])   [place] [facility] [name], [name]       Future Appointments             In 5 months [name], [name] [facility], [facility]

## 2023-03-29 NOTE — Patient Outreach (Signed)
 Care Coordination   Follow Up Visit Note   03/29/2023 Name: Zhanna Melin MRN: [id number] DOB: Mar 02, 1965  Chandelle Harkey is a 58 y.o. year old female who sees [name], [place] for primary care. I spoke with  [name] by phone today.  What matters to the patients health and wellness today?  Received call from patient inquiring about a letter that was posted in her chart stating there was an error in her labs.  After call to lab directly, confirmed this was a mass letter that was sent to all patients, this patient was not directly affected.  She is aware and verbalized understanding.   SDOH assessments and interventions completed:  No     Care Coordination Interventions:  Yes, provided   Interventions Today    Flowsheet Row Most Recent Value  Chronic Disease   Chronic disease during today's visit Other  [notification of lab results]  General Interventions   General Interventions Discussed/Reviewed Communication with, General Interventions Reviewed  [Called to [facility] to confirm letter was not directly related to patient's test results]       Follow up plan: Follow up call scheduled for 4/7    Encounter Outcome:  Patient Visit Completed   Rodney Langton, RN, MSN, CCM [place]  [facility], [facility] RN Care Coordinator Direct Dial: [phone] / Main [phone] Fax [phone] Email: [email or web address] Website: [email or web address]

## 2023-03-29 NOTE — Progress Notes (Signed)
 Patient requesting new prescription for Toujeo be sent to [facility] for her as Endocrinologist increased her dose to Toujeo 33 units daily in a.m. at office visit on 02/16/2023 (needs new prescription as pharmacy advising too soon for her to refill based on previous prescription).  Would you please send new 100 day supply Rx for Toujeo reflecting 33 units daily in a.m (latest direction from Dr. Roosevelt Locks) to [facility] for patient?  Thank you!  Estelle Grumbles, PharmD, [facility] [phone]

## 2023-03-30 NOTE — Telephone Encounter (Signed)
 Requested Prescriptions  Pending Prescriptions Disp Refills   albuterol (ACCUNEB) 1.25 MG/3ML nebulizer solution [Pharmacy Med Name: ALBUTEROL NEB 1.25MG /3ML*25] 75 mL 0    Sig: USE 1 VIAL VIA NEBULIZER TWICE  DAILY     Pulmonology:  Beta Agonists 2 Passed - 03/30/2023  4:14 PM      Passed - Last BP in normal range    BP Readings from Last 1 Encounters:  03/15/23 118/78         Passed - Last Heart Rate in normal range    Pulse Readings from Last 1 Encounters:  03/15/23 90         Passed - Valid encounter within last 12 months    Recent Outpatient Visits           7 months ago Bilateral hip pain   [facility] Margarita Mail, DO   7 months ago Acute cystitis without hematuria   [facility] Berniece Salines, [name]   9 months ago AKI (acute kidney injury) [facility])   [place] [facility] [name], [name]   1 year ago Uncontrolled type 2 diabetes mellitus with hyperglycemia [facility])   [place] [facility] [name], [name]   1 year ago Uncontrolled type 2 diabetes mellitus with hyperglycemia [facility])   [facility] [name], [name]       Future Appointments             In 5 months [name], [name] [facility], [facility]             EPINEPHrine 0.3 mg/0.3 mL IJ SOAJ injection [Pharmacy Med Name: EPINEPHrine AUTO 0.3MG  (Epipen)] 2 each 1    Sig: INJECT 1 PEN INTRAMUSCULARLY AS  NEEDED FOR ALLERGIC RESPONSE AS  DIRECTED BY MD. Assunta Found MEDICAL  HELP AFTER USE     Immunology: Antidotes Passed - 03/30/2023  4:14 PM      Passed - Valid encounter within last 12 months    Recent Outpatient Visits           7 months ago Bilateral hip pain   [facility] Margarita Mail, DO   7 months ago Acute cystitis without hematuria   [facility] Berniece Salines, [name]   9 months ago AKI (acute kidney  injury) [facility])   [place] [facility] [name], [name]   1 year ago Uncontrolled type 2 diabetes mellitus with hyperglycemia [facility])   [place] [name], [name]   1 year ago Uncontrolled type 2 diabetes mellitus with hyperglycemia [facility])   [place] [facility] [name], [name]       Future Appointments             In 5 months [name], [name] [facility], [facility]

## 2023-04-01 ENCOUNTER — Ambulatory Visit: Payer: 59

## 2023-04-01 DIAGNOSIS — Z1231 Encounter for screening mammogram for malignant neoplasm of breast: Secondary | ICD-10-CM

## 2023-04-01 DIAGNOSIS — Z0000 Encounter for general adult medical examination without abnormal findings: Secondary | ICD-10-CM

## 2023-04-01 DIAGNOSIS — Z1211 Encounter for screening for malignant neoplasm of colon: Secondary | ICD-10-CM

## 2023-04-01 NOTE — Patient Instructions (Addendum)
 [name] , Thank you for taking time to come for your Medicare Wellness Visit. I appreciate your ongoing commitment to your health goals. Please review the following plan we discussed and let me know if I can assist you in the future.   Referrals/Orders/Follow-Ups/Clinician Recommendations: REFERRALS FOR MAMMOGRAM & COLOGUARD SENT  You have an order for:  []   2D Mammogram  [x]   3D Mammogram  []   Bone Density     Please call for appointment:  [facility] [employer]  [address] [place] [address] [phone] [facility] [address] [place], [place] [address] [phone] [facility] [address] Geanie Logan [place], [place] [address] [phone]   Make sure to wear two-piece clothing.  No lotions, powders, or deodorants the day of the appointment. Make sure to bring picture ID and insurance card.  Bring list of medications you are currently taking including any supplements.   Schedule your [facility] screening mammogram through MyChart!   Log into your MyChart account.  Go to 'Visit' (or 'Appointments' if on mobile App) --> Schedule an Appointment  Under 'Select a Reason for Visit' choose the Mammogram Screening option.  Complete the pre-visit questions and select the time and place that best fits your schedule.   This is a list of the screening recommended for you and due dates:  Health Maintenance  Topic Date Due   Mammogram  08/12/2017   Pap Smear  06/23/2021   Pap with HPV screening  06/23/2021   Eye exam for diabetics  09/18/2022   COVID-19 Vaccine (4 - 2024-25 season) 09/20/2022   Yearly kidney health urinalysis for diabetes  04/16/2023   Flu Shot  04/19/2023*   Zoster (Shingles) Vaccine (1 of 2) 06/12/2023*   Hemoglobin A1C  08/04/2023   Complete foot exam   12/13/2023   Yearly kidney function blood test for diabetes  03/14/2024   Medicare  Annual Wellness Visit  03/31/2024   Colon Cancer Screening  01/20/2027   DTaP/Tdap/Td vaccine (2 - Td or Tdap) 06/22/2028   Pneumococcal Vaccination (3 of 3 - PPSV23 or PCV20) 09/21/2029   Hepatitis C Screening  Completed   HIV Screening  Completed   HPV Vaccine  Aged Out  *Topic was postponed. The date shown is not the original due date.    Advanced directives: (In Chart) A copy of your advanced directives are scanned into your chart should your provider ever need it.  Next Medicare Annual Wellness Visit scheduled for next year: Yes   04/06/24 @ 8:50 AM BY PHONE

## 2023-04-01 NOTE — Progress Notes (Signed)
 Subjective:   [name] is a 58 y.o. who presents for a Medicare Wellness preventive visit.  Visit Complete: Virtual I connected with  [name] on 04/01/23 by a audio enabled telemedicine application and verified that I am speaking with the correct person using two identifiers.  Patient Location: Home  Provider Location: Office/Clinic  I discussed the limitations of evaluation and management by telemedicine. The patient expressed understanding and agreed to proceed.  Vital Signs: Because this visit was a virtual/telehealth visit, some criteria may be missing or patient reported. Any vitals not documented were not able to be obtained and vitals that have been documented are patient reported.  VideoDeclined- This patient declined Librarian, academic. Therefore the visit was completed with audio only.  AWV Questionnaire: No: Patient Medicare AWV questionnaire was not completed prior to this visit.  Cardiac Risk Factors include: advanced age (>14men, >11 women);diabetes mellitus;dyslipidemia;hypertension;sedentary lifestyle;obesity (BMI >30kg/m2)     Objective:    There were no vitals filed for this visit. There is no height or weight on file to calculate BMI.     04/01/2023    8:56 AM 02/18/2023    1:32 PM 10/27/2022   12:58 PM 09/08/2022    1:08 PM 08/04/2022    1:00 PM 08/03/2022   10:10 PM 08/03/2022    2:19 PM  Advanced Directives  Does Patient Have a Medical Advance Directive? Yes Yes No Yes Yes Yes Yes  Type of Estate agent of [place];Living will Healthcare Power of [place];Living will   Healthcare Power of Attorney    Does patient want to make changes to medical advance directive? No - Patient declined    No - Patient declined No - Patient declined   Copy of Healthcare Power of Attorney in Chart? Yes - validated most recent copy scanned in chart (See row information)    Yes - validated most recent copy  scanned in chart (See row information)    Would patient like information on creating a medical advance directive?      No - Patient declined     Current Medications (verified) Outpatient Encounter Medications as of 04/01/2023  Medication Sig   ACCU-CHEK GUIDE TEST test strip USE 1 STRIP IN THE MORNING, AT  NOON, AND AT BEDTIME   albuterol (ACCUNEB) 1.25 MG/3ML nebulizer solution USE 1 VIAL VIA NEBULIZER TWICE  DAILY   albuterol (VENTOLIN HFA) 108 (90 Base) MCG/ACT inhaler USE 1 TO 2 INHALATIONS BY MOUTH  EVERY 6 HOURS AS NEEDED   amitriptyline (ELAVIL) 25 MG tablet Take 2-3 tablets (50-75 mg total) by mouth at bedtime.   amLODipine (NORVASC) 10 MG tablet TAKE 1 TABLET BY MOUTH DAILY   buprenorphine (BUTRANS) 7.5 MCG/HR Place 1 patch onto the skin once a week.   butalbital-acetaminophen-caffeine (FIORICET) 50-325-40 MG tablet Take 1-2 tablets by mouth every 6 (six) hours as needed for headache.   carvedilol (COREG) 25 MG tablet Take 25 mg by mouth 2 (two) times daily.   cholecalciferol (VITAMIN D3) 25 MCG (1000 UNIT) tablet Take 1 tablet (1,000 Units total) by mouth daily.   clopidogrel (PLAVIX) 75 MG tablet Take 1 tablet (75 mg total) by mouth daily.   Continuous Glucose Sensor (FREESTYLE LIBRE 3 PLUS SENSOR) MISC Inject 1 Device into the skin continuous. Change every 14 days   Continuous Glucose Sensor (FREESTYLE LIBRE 3 SENSOR) MISC 1 Device by Does not apply route continuous.   cycloSPORINE (RESTASIS) 0.05 % ophthalmic emulsion Place 2  drops into both eyes 2 (two) times daily.   Dulaglutide (TRULICITY) 3 MG/0.5ML SOAJ Inject 3 mg as directed once a week.   DULoxetine (CYMBALTA) 60 MG capsule TAKE 1 CAPSULE BY MOUTH EVERY DAY   EPINEPHrine 0.3 mg/0.3 mL IJ SOAJ injection INJECT 1 PEN INTRAMUSCULARLY AS  NEEDED FOR ALLERGIC RESPONSE AS  DIRECTED BY MD. Assunta Found MEDICAL  HELP AFTER USE   ezetimibe (ZETIA) 10 MG tablet Take 1 tablet (10 mg total) by mouth daily.   furosemide (LASIX) 20 MG tablet  TAKE 1 TABLET BY MOUTH DAILY   gabapentin (NEURONTIN) 600 MG tablet 600 mg qAM, 600 mg qPM, 1200 mg QHS   Glucagon (GVOKE HYPOPEN 2-PACK) 1 MG/0.2ML SOAJ Inject 1 mg into the skin once as needed for up to 1 dose (hypoglycemia). Only for severe low blood sugar not responding to drinking juice/glucose tablets, when CBG is <70   hydrALAZINE (APRESOLINE) 50 MG tablet Take 1 tablet by mouth 3 (three) times daily.   insulin glargine, 2 Unit Dial, (TOUJEO MAX SOLOSTAR) 300 UNIT/ML Solostar Pen Inject 33 Units into the skin at bedtime.   insulin lispro (HUMALOG KWIKPEN) 100 UNIT/ML KwikPen Inject 9 Units into the skin 3 (three) times daily. 9u in the a.m.  13u lunchtime 9 u at bedtime.  Sliding scale   levETIRAcetam (KEPPRA) 500 MG tablet Take 500-750 mg by mouth See admin instructions. Take 1 tablet (500mg ) by mouth every morning and take 1 tablets by mouth (750mg ) by mouth every night   meclizine (ANTIVERT) 25 MG tablet Take 1 tablet (25 mg total) by mouth 3 (three) times daily as needed for dizziness.   mirabegron ER (MYRBETRIQ) 50 MG TB24 tablet Take 1 tablet (50 mg total) by mouth daily.   olmesartan-hydrochlorothiazide (BENICAR HCT) 40-25 MG tablet Take 1 tablet by mouth daily.   pantoprazole (PROTONIX) 40 MG tablet Take 1 tablet (40 mg total) by mouth daily as needed.   polyethylene glycol (MIRALAX / GLYCOLAX) 17 g packet Take 17 g by mouth daily as needed for mild constipation.   rosuvastatin (CRESTOR) 40 MG tablet TAKE 1 TABLET BY MOUTH DAILY   senna-docusate (SENOKOT-S) 8.6-50 MG tablet Take 1 tablet by mouth at bedtime as needed for mild constipation.   spironolactone (ALDACTONE) 25 MG tablet Take 25 mg by mouth daily.   tiZANidine (ZANAFLEX) 4 MG tablet Take 1 tablet (4 mg total) by mouth every 8 (eight) hours as needed for muscle spasms.   Ubrogepant (UBRELVY) 50 MG TABS 50 mg or 100 mg taken orally with or without food. If needed, a second dose may be taken at least 2 hours after the initial  dose. The maximum dose in a 24-hour period is 200 mg   WIXELA INHUB 250-50 MCG/DOSE AEPB Inhale 1 puff into the lungs 2 (two) times daily.   No facility-administered encounter medications on file as of 04/01/2023.    Allergies (verified) Codeine, Farxiga [dapagliflozin], and Lactose intolerance (gi)   History: Past Medical History:  Diagnosis Date   Anxiety and depression 09/13/2006   Qualifier: Diagnosis of  By: Daphine Deutscher [name], [place]     Arthritis    joint pain    Asthma    COPD (chronic obstructive pulmonary disease) (HCC)    chronic bronchitis    Depression    Diabetes mellitus    since age 73; type 2 IDDM   Diabetic neuropathy, painful (HCC)    FEET AND HANDS   Diabetic retinopathy (HCC)    Diastolic dysfunction  a.  Echo 11/17: EF 60-65%, mild LVH, no RWMA, Gr1DD, mild AI, dilated aortic root measuring 38 mm, mildly dilated ascending aorta   Dilated aortic root (HCC)    38mm by echo 11/2015   Dizziness    secondary to diabetes and hypertension    Epilepsy idiopathic petit mal (HCC)    last seizure 2012;controlled w/ topomax   GERD (gastroesophageal reflux disease)    Glaucoma    NOT ON ANY EYE DROPS    Headache(784.0)    migraines    Heart murmur    born with    History of stress test    a. 11/17: Normal perfusion, EF 53%, normal study   Hx of blood clots    hematomas removed from left side of brain from 11mos to 58yrs old    Hyperlipidemia    Hypertension    Legally blind    left eye    MIGRAINE HEADACHE 09/14/2006   Qualifier: Diagnosis of  By: Daphine Deutscher [name], Nykedtra     Myocardial infarction [facility])    a.  Patient reported history of without objective documentation   Nephrolithiasis    frequent urination , urination at nite  PT SEEN IN ER 09/22/11 FOR BACK AND RT SIDED PAIN--HAS KNOWN STONE RT URETER AND UA IN ER SHOWED UTI   Pain 09/23/2011   LOWER BACK AND RIGHT SIDE--PT HAS RIGHT URETERAL STONE   Pneumonia    hx of 2009   Pyelonephritis    SBO (small  bowel obstruction) (HCC) 04/23/2021   Seizures (HCC)    Sleep apnea    [facility];does not use Cpap ; mild   Small bowel obstruction (HCC) 05/18/2021   Stress incontinence    Stroke (HCC)    last 2003  RESIDUAL LEFT LEG WEAKNESS--NO OTHER RESIDUAL PROBLEMS   Past Surgical History:  Procedure Laterality Date   BRAIN HEMATOMA EVACUATION     five procedures total, first procedure when 33 months old, last at 58 years of age.   CYSTOSCOPY W/ RETROGRADES  09/24/2011   Procedure: CYSTOSCOPY WITH RETROGRADE PYELOGRAM;  Surgeon: Anner Crete, MD;  Location: [facility];  Service: Urology;  Laterality: Right;   CYSTOSCOPY WITH URETEROSCOPY  09/24/2011   Procedure: CYSTOSCOPY WITH URETEROSCOPY;  Surgeon: Anner Crete, MD;  Location: [facility];  Service: Urology;  Laterality: Right;  Balloon dilation right ureter    CYSTOSCOPY/RETROGRADE/URETEROSCOPY  08/24/2011   Procedure: CYSTOSCOPY/RETROGRADE/URETEROSCOPY;  Surgeon: Milford Cage, MD;  Location: [facility];  Service: Urology;  Laterality: Right;  Cysto, Right retrograde Pyelogram, right stent placement.    INCISIONAL HERNIA REPAIR  05/05/2011   Procedure: LAPAROSCOPIC INCISIONAL HERNIA;  Surgeon: Emelia Loron, MD;  Location: [facility] OR;  Service: General;  Laterality: N/A;   kidney stone removal     LAPAROTOMY N/A 06/12/2021   Procedure: EXPLORATORY LAPAROTOMY, LYSIS OF ADHESIONS;  Surgeon: Henrene Dodge, MD;  Location: [facility] ORS;  Service: General;  Laterality: N/A;   MASS EXCISION  05/05/2011   Procedure: EXCISION MASS;  Surgeon: Emelia Loron, MD;  Location: [facility] OR;  Service: General;  Laterality: Right;   [facility]     RETINAL DETACHMENT SURGERY Left 1990   RIGHT/LEFT HEART CATH AND CORONARY ANGIOGRAPHY N/A 04/28/2019   Procedure: RIGHT/LEFT HEART CATH AND CORONARY ANGIOGRAPHY;  Surgeon: Alwyn Pea, MD;  Location: [facility] INVASIVE CV LAB;  Service: Cardiovascular;  Laterality: N/A;   SHUNT REMOVAL     shunt inserted at age 59 removed  at age  11    VAGINAL HYSTERECTOMY  1996   Family History  Problem Relation Age of Onset   Uterine cancer Mother    Hypertension Mother    Cancer Mother    Brain cancer Maternal Grandmother    Hypertension Maternal Grandmother    Birth defects Daughter    Hypertension Daughter    Cirrhosis Maternal Grandfather    ADD / ADHD Maternal Grandfather    Birth defects Maternal Grandfather    Diabetes Maternal Grandfather    Anesthesia problems Neg Hx    Colon cancer Neg Hx    Esophageal cancer Neg Hx    Pancreatic cancer Neg Hx    Social History   Socioeconomic History   Marital status: Widowed    Spouse name: Not on file   Number of children: 2   Years of education: Not on file   Highest education level: 12th grade  Occupational History   Occupation: disabled  Tobacco Use   Smoking status: Former    Current packs/day: 0.00    Average packs/day: 1 pack/day for 15.0 years (15.0 ttl pk-yrs)    Types: Cigarettes    Start date: 01/20/1972    Quit date: 01/20/1987    Years since quitting: 36.2   Smokeless tobacco: Never   Tobacco comments:    quit more than 20 years  Vaping Use   Vaping status: Never Used  Substance and Sexual Activity   Alcohol use: Yes    Alcohol/week: 1.0 standard drink of alcohol    Types: 1 Glasses of wine per week   Drug use: No    Comment: hx of marijuana use no longer uses    Sexual activity: Yes    Partners: Male    Birth control/protection: Surgical  Other Topics Concern   Not on file  Social History Narrative   Pt lives with her daughter   Social Drivers of Health   Financial Resource Strain: Low Risk  (04/01/2023)   Overall Financial Resource Strain (CARDIA)    Difficulty of Paying Living Expenses: Not hard at all  Food Insecurity: No Food Insecurity (04/01/2023)   Hunger Vital Sign    Worried About Running Out of Food in the Last Year: Never true    Ran Out of Food in the Last Year: Never true  Transportation Needs: No Transportation Needs  (04/01/2023)   PRAPARE - Administrator, Civil Service (Medical): No    Lack of Transportation (Non-Medical): No  Physical Activity: Insufficiently Active (04/01/2023)   Exercise Vital Sign    Days of Exercise per Week: 2 days    Minutes of Exercise per Session: 10 min  Stress: No Stress Concern Present (04/01/2023)   [employer] of Occupational Health - Occupational Stress Questionnaire    Feeling of Stress : Not at all  Social Connections: Moderately Isolated (04/01/2023)   Social Connection and Isolation Panel [NHANES]    Frequency of Communication with Friends and Family: More than three times a week    Frequency of Social Gatherings with Friends and Family: Once a week    Attends Religious Services: More than 4 times per year    Active Member of [employer] or Organizations: No    Attends Banker Meetings: Never    Marital Status: Widowed    Tobacco Counseling Counseling given: Not Answered Tobacco comments: quit more than 20 years    Clinical Intake:  Pre-visit preparation completed: Yes  Pain : No/denies pain     BMI -  recorded: 39.5 Nutritional Status: BMI > 30  Obese Nutritional Risks: None Diabetes: Yes CBG done?: No Did pt. bring in CBG monitor from home?: No  How often do you need to have someone help you when you read instructions, pamphlets, or other written materials from your doctor or pharmacy?: 1 - Never  Interpreter Needed?: No  Information entered by :: Kennedy Bucker, LPN   Activities of Daily Living     04/01/2023    8:57 AM 09/01/2022    9:16 AM  In your present state of health, do you have any difficulty performing the following activities:  Hearing? 0 0  Vision? 1 0  Difficulty concentrating or making decisions? 0 0  Walking or climbing stairs? 1 1  Dressing or bathing? 0 0  Doing errands, shopping? 1 0  Preparing Food and eating ? N   Using the Toilet? N   In the past six months, have you accidently leaked  urine? N   Do you have problems with loss of bowel control? N   Managing your Medications? N   Managing your Finances? N   Housekeeping or managing your Housekeeping? N     Patient Care Team: [name], [name] as PCP - General (Family Medicine) Morene Crocker, MD as Referring Physician (Neurology) Alwyn Pea, MD as Consulting Physician (Cardiology) Edward Jolly, MD as Consulting Physician (Pain Medicine) Vida Rigger, MD as Consulting Physician (Pulmonary Disease) Gwyneth Revels, DPM as Referring Physician (Podiatry) Rodney Langton, RN as [facility] Care Management Delles, Jackelyn Poling, RPH-CPP as Pharmacist  Indicate any recent Medical Services you may have received from other than [facility] providers in the past year (date may be approximate).     Assessment:   This is a routine wellness examination for [name].  Hearing/Vision screen Hearing Screening - Comments:: NO AIDS Vision Screening - Comments:: WEARS GLASSES ALL THE TIME, BLIND IN LEFT EYE-    Goals Addressed             This Visit's Progress    DIET - EAT MORE FRUITS AND VEGETABLES         Depression Screen     04/01/2023    8:54 AM 03/15/2023    2:56 PM 02/18/2023    1:31 PM 09/01/2022    9:16 AM 08/25/2022   10:22 AM 06/29/2022    2:05 PM 03/26/2022    9:05 AM  PHQ 2/9 Scores  PHQ - 2 Score 0 0 2 0 0 0 0  PHQ- 9 Score 0 0  0  0     Fall Risk     04/01/2023    8:57 AM 02/18/2023    1:31 PM 10/27/2022   12:59 PM 09/08/2022    1:08 PM 09/01/2022    9:12 AM  Fall Risk   Falls in the past year? 0 1 1 1  0  Number falls in past yr: 0 1 1 0 0  Injury with Fall? 0 0 1 0 0  Comment   busted head last head    Risk for fall due to : No Fall Risks Impaired balance/gait;Impaired mobility;History of fall(s) Impaired mobility    Risk for fall due to: Comment   BS dropped and was dizzy    Follow up Falls prevention discussed;Falls evaluation completed Falls evaluation completed;Education  provided       MEDICARE RISK AT HOME:  Medicare Risk at Home Any stairs in or around the home?: Yes If so, are there any without  handrails?: Yes Home free of loose throw rugs in walkways, pet beds, electrical cords, etc?: Yes Adequate lighting in your home to reduce risk of falls?: Yes Life alert?: No Use of a cane, walker or w/c?: Yes (QUAD CANE & WALKER & ROLLATOR) Grab bars in the bathroom?: No Shower chair or bench in shower?: Yes Elevated toilet seat or a handicapped toilet?: Yes (ALSO HAS BEDSIDE COMMODE)  TIMED UP AND GO:  Was the test performed?  No  Cognitive Function: 6CIT completed        04/01/2023    9:04 AM 03/26/2022    9:15 AM  6CIT Screen  What Year? 0 points 0 points  What month? 0 points 0 points  What time? 0 points 0 points  Count back from 20 0 points 0 points  Months in reverse 0 points 0 points  Repeat phrase 2 points 2 points  Total Score 2 points 2 points    Immunizations Immunization History  Administered Date(s) Administered   Influenza Split 10/07/2012   Influenza Whole 11/20/2006   Influenza, Seasonal, Injecte, Preservative Fre 11/12/2006   Influenza,inj,Quad PF,6+ Mos 10/30/2013, 10/19/2014, 01/24/2018, 10/12/2018, 11/13/2020   Influenza-Unspecified 12/20/2019, 11/02/2021   PFIZER(Purple Top)SARS-COV-2 Vaccination 11/29/2019, 12/20/2019   PPD Test 11/12/2006   Pfizer Covid-19 Vaccine Bivalent Booster 2yrs & up 04/25/2021   Pneumococcal Conjugate-13 04/29/2015   Pneumococcal Polysaccharide-23 11/20/2006, 09/25/2011, 04/29/2019   Tdap 06/23/2018    Screening Tests Health Maintenance  Topic Date Due   MAMMOGRAM  08/12/2017   Cervical Cancer Screening (Pap smear)  06/23/2021   Cervical Cancer Screening (HPV/Pap Cotest)  06/23/2021   OPHTHALMOLOGY EXAM  09/18/2022   COVID-19 Vaccine (4 - 2024-25 season) 09/20/2022   Diabetic kidney evaluation - Urine ACR  04/16/2023   INFLUENZA VACCINE  04/19/2023 (Originally 08/20/2022)   Zoster  Vaccines- Shingrix (1 of 2) 06/12/2023 (Originally 09/22/2014)   HEMOGLOBIN A1C  08/04/2023   FOOT EXAM  12/13/2023   Diabetic kidney evaluation - eGFR measurement  03/14/2024   Medicare Annual Wellness (AWV)  03/31/2024   Colonoscopy  01/20/2027   DTaP/Tdap/Td (2 - Td or Tdap) 06/22/2028   Pneumococcal Vaccine 45-88 Years old (3 of 3 - PPSV23 or PCV20) 09/21/2029   Hepatitis C Screening  Completed   HIV Screening  Completed   HPV VACCINES  Aged Out    Health Maintenance  Health Maintenance Due  Topic Date Due   MAMMOGRAM  08/12/2017   Cervical Cancer Screening (Pap smear)  06/23/2021   Cervical Cancer Screening (HPV/Pap Cotest)  06/23/2021   OPHTHALMOLOGY EXAM  09/18/2022   COVID-19 Vaccine (4 - 2024-25 season) 09/20/2022   Diabetic kidney evaluation - Urine ACR  04/16/2023   Health Maintenance Items Addressed: Cologuard Ordered MAMMOGRAM ORDERED  Additional Screening:  Vision Screening: Recommended annual ophthalmology exams for early detection of glaucoma and other disorders of the eye.  Dental Screening: Recommended annual dental exams for proper oral hygiene  Community Resource Referral / Chronic Care Management: CRR required this visit?  No   CCM required this visit?  No     Plan:     I have personally reviewed and noted the following in the patient's chart:   Medical and social history Use of alcohol, tobacco or illicit drugs  Current medications and supplements including opioid prescriptions. Patient is currently taking opioid prescriptions. Information provided to patient regarding non-opioid alternatives. Patient advised to discuss non-opioid treatment plan with their provider. Functional ability and status Nutritional status Physical activity Advanced directives  List of other physicians Hospitalizations, surgeries, and ER visits in previous 12 months Vitals Screenings to include cognitive, depression, and falls Referrals and appointments  In  addition, I have reviewed and discussed with patient certain preventive protocols, quality metrics, and best practice recommendations. A written personalized care plan for preventive services as well as general preventive health recommendations were provided to patient.     Hal Hope, LPN   2/44/0102   After Visit Summary: (MyChart) Due to this being a telephonic visit, the after visit summary with patients personalized plan was offered to patient via MyChart   Notes:  MAMMOGRAM & COLOGUARD ORDERED

## 2023-04-05 ENCOUNTER — Telehealth: Payer: Self-pay

## 2023-04-05 DIAGNOSIS — Z794 Long term (current) use of insulin: Secondary | ICD-10-CM

## 2023-04-05 MED ORDER — INSULIN LISPRO (1 UNIT DIAL) 100 UNIT/ML (KWIKPEN): PEN_INJECTOR | SUBCUTANEOUS | 2 refills | Status: DC

## 2023-04-05 NOTE — Telephone Encounter (Signed)
 Requested Prescriptions   Signed Prescriptions Disp Refills   insulin lispro (HUMALOG KWIKPEN) 100 UNIT/ML KwikPen 30 mL 2    Sig: 9u in the a.m.  13u lunchtime 9 u at bedtime.  Sliding scale    Authorizing Provider: Altamese [place]    Ordering User: Beverely Pace

## 2023-04-06 ENCOUNTER — Telehealth: Payer: Self-pay | Admitting: "Endocrinology

## 2023-04-06 ENCOUNTER — Telehealth: Payer: Self-pay

## 2023-04-06 NOTE — Telephone Encounter (Signed)
 I have call and spoke to [facility] and corrected the pharmacy. Pt informed.

## 2023-04-06 NOTE — Telephone Encounter (Signed)
 Patient states that [facility] has prescription as 9 units at bedtime and no other doses.  Patient states that she takes 9 units in AM, 13 at lunchtime, and 9 units at bedtime.  Patient would like for the prescription to be corrected with [facility].

## 2023-04-07 DIAGNOSIS — I10 Essential (primary) hypertension: Secondary | ICD-10-CM | POA: Diagnosis not present

## 2023-04-07 DIAGNOSIS — Z8673 Personal history of transient ischemic attack (TIA), and cerebral infarction without residual deficits: Secondary | ICD-10-CM | POA: Diagnosis not present

## 2023-04-07 DIAGNOSIS — J449 Chronic obstructive pulmonary disease, unspecified: Secondary | ICD-10-CM | POA: Diagnosis not present

## 2023-04-07 DIAGNOSIS — G4733 Obstructive sleep apnea (adult) (pediatric): Secondary | ICD-10-CM | POA: Diagnosis not present

## 2023-04-07 NOTE — Telephone Encounter (Signed)
 Rx sent

## 2023-04-12 ENCOUNTER — Ambulatory Visit: Payer: 59 | Admitting: "Endocrinology

## 2023-04-18 ENCOUNTER — Other Ambulatory Visit: Payer: Self-pay | Admitting: Student in an Organized Health Care Education/Training Program

## 2023-04-18 DIAGNOSIS — G890 Central pain syndrome: Secondary | ICD-10-CM

## 2023-04-18 DIAGNOSIS — E1142 Type 2 diabetes mellitus with diabetic polyneuropathy: Secondary | ICD-10-CM

## 2023-04-18 DIAGNOSIS — G894 Chronic pain syndrome: Secondary | ICD-10-CM

## 2023-04-19 ENCOUNTER — Other Ambulatory Visit: Payer: Self-pay

## 2023-04-19 DIAGNOSIS — Z794 Long term (current) use of insulin: Secondary | ICD-10-CM

## 2023-04-19 MED ORDER — PEN NEEDLES 32G X 4 MM MISC: 1.0000 | Freq: Four times a day (QID) | 2 refills | Status: DC

## 2023-04-19 NOTE — Telephone Encounter (Signed)
 Requested Prescriptions   Signed Prescriptions Disp Refills   Insulin Pen Needle (PEN NEEDLES) 32G X 4 MM MISC 200 each 2    Sig: 1 Device by Does not apply route in the morning, at noon, in the evening, and at bedtime.    Authorizing Provider: Altamese [place]    Ordering User: Beverely Pace

## 2023-04-21 ENCOUNTER — Other Ambulatory Visit: Payer: Self-pay | Admitting: Pharmacist

## 2023-04-21 DIAGNOSIS — I10 Essential (primary) hypertension: Secondary | ICD-10-CM

## 2023-04-21 DIAGNOSIS — E1165 Type 2 diabetes mellitus with hyperglycemia: Secondary | ICD-10-CM

## 2023-04-21 NOTE — Progress Notes (Unsigned)
 04/21/2023 Name: [name] MRN: [id number] DOB: 1965-09-17  Chief Complaint  Patient presents with   Medication Management    [name] is a 58 y.o. year old female who presented for a telephone visit.   They were referred to the pharmacist by their PCP for assistance in managing complex medication management.    Subjective:  Care Team: Primary Care Provider: [name], Cordelia Poche ; Next Scheduled Visit: 09/13/2023 Cardiologist: Mirian Capuchin, MD Endocrinologist: Altamese Alton, MD; Next Scheduled Visit: 04/22/2023 Pain Management: Edward Jolly, MD; Next Scheduled Visit: 05/13/2023 Nurse Care Manager: Rodney Langton, RN; Next Scheduled Visit: 04/26/2023 Neurologist: Gelene Mink, PA  Pulmonologist: Georga Hacking, MD   Medication Access/Adherence  Current Pharmacy:  [facility]) - [place], [place] - [address] [facility] [address] [place] [address] [place] [address] Phone: [phone] Fax: [phone]  [facility] - [place], [place] - [address] [place] [address] Phone: [phone] Fax: [phone]   Patient reports affordability concerns with their medications: No  Patient reports access/transportation concerns to their pharmacy: No  Patient reports adherence concerns with their medications:  No   Denies using weekly pillbox; takes medications directly from pill bottles and keeps record using a chart  Diabetes:  Patient followed by [facility] Endocrinology  Current medications:  - Toujeo 33 units at bedtime - Humalog 9 units with breakfast, 13 units with lunch and 9 units before supper - Trulicity 3 mg weekly Reports tolerating well  Reports uses Freestyle Libre 3 Plus Continuous Glucose Monitoring, but is unable to connect via LibreView today  Patient reports the following blood sugar data: Average Glucose: 203 mg/dL Time in Goal:   - Time in range 70-180: 32% - Time above range: 68% - Time below range: 0%  Patient denies hypoglycemic s/sx including dizziness, shakiness, sweating.    Hypertension:  Patient followed by [facility]iology  Current medications:  - amlodipine 10 mg daily - carvedilol 25 mg twice daily - furosemide 20 mg daily - hydralazine 50 mg three times daily  Previous therapies tried: olmesartan-HCTZ, olmesartan, spironolactone 25 mg daily - reports has not taken these medications in > 1 year  Patient has a validated, automated, upper arm home BP cuff Current blood pressure readings readings: recalls yesterday before bedtime: 140/70  Patient denies hypotensive s/sx including dizziness, lightheadedness.   Current physical activity: limited to walking around the house during the day    COPD/Asthma:  Patient followed by [facility] Pulmonology  Current medications: - Wixela 250-50 mcg - 1 puff twice daily.  - albuterol nebulizer solution - 1 vial via nebulizer twice daily as needed - albuterol HFA inhaler - 1-2 puffs every 6 hours as needed   Confirms rinses and spits out after each use of Wixela inhaler    Objective:  Lab Results  Component Value Date   HGBA1C 11.0 (A) 02/04/2023    Lab Results  Component Value Date   CREATININE 1.07 (H) 03/15/2023   BUN 14 03/15/2023   NA 140 03/15/2023   K 4.0 03/15/2023   CL 96 (L) 03/15/2023   CO2 33 (H) 03/15/2023    Lab Results  Component Value Date   CHOL 129 02/16/2023   HDL 45 (L) 02/16/2023   LDLCALC 60 02/16/2023   TRIG 166 (H) 02/16/2023   CHOLHDL 2.9 02/16/2023   BP Readings from Last 3 Encounters:  03/15/23 118/78  02/18/23  139/85  02/16/23 106/68   Pulse Readings from Last 3 Encounters:  03/15/23 90  02/18/23 80  02/16/23 93    Medications Reviewed Today     Reviewed by Manuela Neptune, RPH-CPP (Pharmacist) on 04/21/23 at [phone]  Med List Status: <None>   Medication Order Taking? Sig  Documenting Provider Last Dose Status Informant  ACCU-CHEK GUIDE TEST test strip [id number]  USE 1 STRIP IN THE MORNING, AT  NOON, AND AT BEDTIME [name], [name], MD  Active   albuterol (ACCUNEB) 1.25 MG/3ML nebulizer solution [id number] Yes USE 1 VIAL VIA NEBULIZER TWICE  DAILY [name], [name] Taking Active   albuterol (VENTOLIN HFA) 108 (90 Base) MCG/ACT inhaler [id number] Yes USE 1 TO 2 INHALATIONS BY MOUTH  EVERY 6 HOURS AS NEEDED [name], [name] Taking Active   amitriptyline (ELAVIL) 25 MG tablet [id number] Yes Take 2-3 tablets (50-75 mg total) by mouth at bedtime. Edward Jolly, MD Taking Active   amLODipine (NORVASC) 10 MG tablet [id number] Yes TAKE 1 TABLET BY MOUTH DAILY [name], [name] Taking Active   buprenorphine (BUTRANS) 7.5 MCG/HR [id number] Yes Place 1 patch onto the skin once a week. Edward Jolly, MD Taking Active   butalbital-acetaminophen-caffeine (FIORICET) 50-325-40 MG tablet [id number] Yes Take 1-2 tablets by mouth every 6 (six) hours as needed for headache. Edward Jolly, MD Taking Active Multiple Informants, Self, Pharmacy Records  carvedilol (COREG) 25 MG tablet [id number] Yes Take 25 mg by mouth 2 (two) times daily. [provider] Taking Active Multiple Informants, Self, Pharmacy Records  cholecalciferol (VITAMIN D3) 25 MCG (1000 UNIT) tablet [id number] Yes Take 1 tablet (1,000 Units total) by mouth daily. [name], [name] Taking Active   clopidogrel (PLAVIX) 75 MG tablet [id number] Yes Take 1 tablet (75 mg total) by mouth daily. Jamelle Haring, MD Taking Active Multiple Informants, Self, Pharmacy Records  Continuous Glucose Sensor (FREESTYLE LIBRE 3 PLUS SENSOR) [place] [id number]  Inject 1 Device into the skin continuous. Change every 14 days Altamese [place], MD  Active   Continuous Glucose Sensor (FREESTYLE LIBRE 3 SENSOR) [place] [id number]  1 Device by Does not apply route continuous. Altamese Cashion, MD  Active   cycloSPORINE (RESTASIS) 0.05 % ophthalmic  emulsion [id number]  Place 2 drops into both eyes 2 (two) times daily. [provider]  Active Multiple Informants, Self, Pharmacy Records           Med Note Dollene Primrose   Wed Nov 13, 2020 10:05 AM)    Dulaglutide (TRULICITY) 3 MG/0.5ML Ivory Broad [id number] Yes Inject 3 mg as directed once a week. Altamese , MD Taking Active   DULoxetine (CYMBALTA) 60 MG capsule [id number] Yes TAKE 1 CAPSULE BY MOUTH EVERY DAY Berniece Salines, [name] Taking Active   EPINEPHrine 0.3 mg/0.3 mL IJ SOAJ injection [id number]  INJECT 1 PEN INTRAMUSCULARLY AS  NEEDED FOR ALLERGIC RESPONSE AS  DIRECTED BY MD. Assunta Found MEDICAL  HELP AFTER USE [name], [name]  Active   ezetimibe (ZETIA) 10 MG tablet [id number] Yes Take 1 tablet (10 mg total) by mouth daily. Berniece Salines, [name] Taking Active   furosemide (LASIX) 20 MG tablet [id number] Yes TAKE 1 TABLET BY MOUTH DAILY [name], [name] Taking Active   gabapentin (NEURONTIN) 600 MG tablet [id number] Yes 600 mg qAM, 600 mg qPM, 1200 mg QHS Edward Jolly, MD Taking Active   Glucagon (GVOKE HYPOPEN 2-PACK) 1 MG/0.2ML SOAJ [id number]  Inject 1 mg into the skin once as needed for up to 1 dose (hypoglycemia). Only for  severe low blood sugar not responding to drinking juice/glucose tablets, when CBG is <70 [name], [name], MD  Active   hydrALAZINE (APRESOLINE) 50 MG tablet [id number] Yes Take 1 tablet by mouth 3 (three) times daily. [provider] Taking Active Multiple Informants, Self, Pharmacy Records  insulin glargine, 2 Unit Dial, (TOUJEO MAX SOLOSTAR) 300 UNIT/ML Solostar Pen [id number] Yes Inject 33 Units into the skin at bedtime. Altamese Pillow, MD Taking Active   insulin lispro (HUMALOG KWIKPEN) 100 UNIT/ML KwikPen [id number] Yes 9u in the a.m.  13u lunchtime 9 u at bedtime.  Sliding scale [name], [name], MD Taking Active   Insulin Pen Needle (PEN NEEDLES) 32G X 4 MM MISC [id number]  1 Device by Does not apply route in the morning, at noon, in the evening, and at bedtime.  Altamese McCormick, MD  Active   levETIRAcetam (KEPPRA) 500 MG tablet [id number] Yes Take 500-750 mg by mouth See admin instructions. Take 1 tablet (500mg ) by mouth every morning and take 1 tablets by mouth (750mg ) by mouth every night [provider] Taking Active Multiple Informants, Self, Pharmacy Records  meclizine (ANTIVERT) 25 MG tablet [id number]  Take 1 tablet (25 mg total) by mouth 3 (three) times daily as needed for dizziness. Trinna Post, MD  Active Multiple Informants, Self, Pharmacy Records  mirabegron ER [facility]) 50 MG TB24 tablet [id number] Yes Take 1 tablet (50 mg total) by mouth daily. Berniece Salines, [name] Taking Active   olmesartan-hydrochlorothiazide (BENICAR HCT) 40-25 MG tablet [id number] No Take 1 tablet by mouth daily.  Patient not taking: Reported on 04/21/2023   Dorothyann Peng D, MD Not Taking Active   pantoprazole (PROTONIX) 40 MG tablet [id number] Yes Take 1 tablet (40 mg total) by mouth daily as needed. [name], [name] Taking Active   polyethylene glycol (MIRALAX / GLYCOLAX) 17 g packet [id number] Yes Take 17 g by mouth daily as needed for mild constipation. Meredeth Ide, MD Taking Active Multiple Informants, Self, Pharmacy Records  rosuvastatin (CRESTOR) 40 MG tablet [id number] Yes TAKE 1 TABLET BY MOUTH DAILY [name], [name], MD Taking Active   senna-docusate (SENOKOT-S) 8.6-50 MG tablet [id number] Yes Take 1 tablet by mouth at bedtime as needed for mild constipation. Meredeth Ide, MD Taking Active Multiple Informants, Self, Pharmacy Records  spironolactone (ALDACTONE) 25 MG tablet [id number] No Take 25 mg by mouth daily.  Patient not taking: Reported on 04/21/2023   Dorothyann Peng D, MD Not Taking Active   tiZANidine (ZANAFLEX) 4 MG tablet [id number] Yes Take 1 tablet (4 mg total) by mouth every 8 (eight) hours as needed for muscle spasms. Edward Jolly, MD Taking Active   Ubrogepant (UBRELVY) 50 MG TABS [id number] Yes 50 mg or 100 mg taken orally with or without food.  If needed, a second dose may be taken at least 2 hours after the initial dose. The maximum dose in a 24-hour period is 200 mg Edward Jolly, MD Taking Active   WIXELA INHUB 250-50 MCG/DOSE AEPB [id number] Yes Inhale 1 puff into the lungs 2 (two) times daily. [provider] Taking Active Multiple Informants, Self, Pharmacy Records  Med List Note Valerie Salts, RN 09/14/22 1443): 12-06-18 UDS Oxy/Acet approved through 01-19-20 Mr 03-10-2021 09-14-2022 PA for Buprenorphine patch done via [facility] [id number]  kt              Assessment/Plan:   Comprehensive medication review performed; medication list updated in electronic medical record - Caution patient for risk of dizziness/sedation with medications including amitriptyline,  buprenorphine, Fioricet, gabapentin, meclizine and tizanidine, particularly when taken in combination  Patient verbalizes understanding. Denies dizziness or sedation with these medications - From review of dispensing history in chart/patient report, appears she has not taken olmesartan, olmesartan-HCTZ or spironolactone in > 1 year.   Outreach to [facility]. Leave message requesting call back to clarify patient's hypertension medication regimen.   Counsel on importance of medication adherence. Discuss benefits of using weekly pillbox as adherence aid  Diabetes: - Currently uncontrolled - Reviewed long term cardiovascular and renal outcomes of uncontrolled blood sugar - Reviewed goal A1c, goal fasting, and goal 2 hour post prandial glucose - Reviewed dietary modifications including importance of having regular well-balanced meals and snacks throughout the day, while controlling carbohydrate portion sizes - Counsel on signs of low blood sugar and how to manage hypoglycemia  Encourage patient to pick up and carry glucose tablets with her in case needed  - Recommend to continue to use Freestyle Libre 3 CGM to monitor blood sugar/as feedback on  dietary choices Patient to check glucose with fingerstick check when needed for symptoms and as back up to CGM. Patient to contact office if needed for readings outside of established parameters or symptoms   Hypertension: - Reviewed long term cardiovascular and renal outcomes of uncontrolled blood pressure - Reviewed appropriate blood pressure monitoring technique and reviewed goal blood pressure.  - Patient states that she will send a MyChart message to Cardiologist to collaborate regarding her medication regimen and provide latest home blood pressure readings - Recommend to monitor home blood pressure, keep log of results and have this record to review at upcoming medical appointments. Patient to contact provider office sooner if needed for readings outside of established parameters or symptoms   Asthma/COPD: - Reviewed appropriate inhaler technique.   Follow Up Plan: Clinical Pharmacist will follow up with patient by telephone on 06/02/2023 at 10:30 AM   Estelle Grumbles, PharmD, [facility] [phone]

## 2023-04-22 ENCOUNTER — Ambulatory Visit (INDEPENDENT_AMBULATORY_CARE_PROVIDER_SITE_OTHER): Admitting: "Endocrinology

## 2023-04-22 ENCOUNTER — Encounter: Payer: Self-pay | Admitting: "Endocrinology

## 2023-04-22 VITALS — BP 136/88 | HR 89 | Ht 66.0 in | Wt 245.0 lb

## 2023-04-22 DIAGNOSIS — E1165 Type 2 diabetes mellitus with hyperglycemia: Secondary | ICD-10-CM | POA: Diagnosis not present

## 2023-04-22 DIAGNOSIS — Z7985 Long-term (current) use of injectable non-insulin antidiabetic drugs: Secondary | ICD-10-CM

## 2023-04-22 DIAGNOSIS — Z794 Long term (current) use of insulin: Secondary | ICD-10-CM | POA: Diagnosis not present

## 2023-04-22 DIAGNOSIS — E7800 Pure hypercholesterolemia, unspecified: Secondary | ICD-10-CM | POA: Diagnosis not present

## 2023-04-22 MED ORDER — TRULICITY 4.5 MG/0.5ML ~~LOC~~ SOAJ: 4.5000 mg | SUBCUTANEOUS | 3 refills | Status: DC

## 2023-04-22 NOTE — Patient Instructions (Signed)

## 2023-04-22 NOTE — Progress Notes (Signed)
 Outpatient Endocrinology Note Karen Fairfield, MD  04/22/23   [name] [facility] 1965-06-13 [id number]  Referring Provider: [name], [name] Primary Care Provider: [name], [name] Reason for consultation: Subjective   Assessment & Plan  Diagnoses and all orders for this visit:  Uncontrolled type 2 diabetes mellitus with hyperglycemia, with long-term current use of insulin (HCC)  Long-term (current) use of injectable non-insulin antidiabetic drugs  Long-term insulin use (HCC)  Pure hypercholesterolemia  Other orders -     Dulaglutide (TRULICITY) 4.5 MG/0.5ML SOAJ; Inject 4.5 mg as directed once a week.     Diabetes Type II complicated by CAD, stroke  Lab Results  Component Value Date   GFR 74.92 04/14/2022   Hba1c goal less than 7, current Hba1c is  Lab Results  Component Value Date   HGBA1C 11.0 (A) 02/04/2023   Will recommend the following: Toujeo 38-40 units daily in a.m. Humalog: 9 for breakfast, 13 for lunch, and 9 for dinner (13 units if BG>200, otherwise it is 9 units)  Trulicity 4.5 mg/week (patient is well tolerating and wants dose escalation)  Libre 3 is covered, Dexcom not covered  Toujeo dose changed from 70 to 20 units recently during hospital visit and pt was stopped on Trulicity 4.5 mg weekly due to diarrhea and bloating   No known contraindications/side effects to any of above medications No history of MEN syndrome/medullary thyroid cancer/pancreatitis or pancreatic cancer in self or family Has Glucagon on file   -Last LD and Tg are as follows: Lab Results  Component Value Date   LDLCALC 60 02/16/2023    Lab Results  Component Value Date   TRIG 166 (H) 02/16/2023   -On rosuvastatin 40 mg every day, zetia 10 mg every day  -Follow low fat diet and exercise   -Blood pressure goal <140/90 - Microalbumin/creatinine goal is < 30 -Last MA/Cr is as follows: Lab Results  Component Value Date   MICROALBUR 3.6 (H) 04/16/2022    -not on ACE/ARB  -diet changes including salt restriction -limit eating outside -counseled BP targets per standards of diabetes care -uncontrolled blood pressure can lead to retinopathy, nephropathy and cardiovascular and atherosclerotic heart disease  Reviewed and counseled on: -A1C target -Blood sugar targets -Complications of uncontrolled diabetes  -Checking blood sugar before meals and bedtime and bring log next visit -All medications with mechanism of action and side effects -Hypoglycemia management: rule of 15's, Glucagon Emergency Kit and medical alert ID -low-carb low-fat plate-method diet -At least 20 minutes of physical activity per day -Annual dilated retinal eye exam and foot exam -compliance and follow up needs -follow up as scheduled or earlier if problem gets worse  Call if blood sugar is less than 70 or consistently above 250    Take a 15 gm snack of carbohydrate at bedtime before you go to sleep if your blood sugar is less than 100.    If you are going to fast after midnight for a test or procedure, ask your physician for instructions on how to reduce/decrease your insulin dose.    Call if blood sugar is less than 70 or consistently above 250  -Treating a low sugar by rule of 15  (15 gms of sugar every 15 min until sugar is more than 70) If you feel your sugar is low, test your sugar to be sure If your sugar is low (less than 70), then take 15 grams of a fast acting Carbohydrate (3-4 glucose tablets or glucose gel or 4  ounces of juice or regular soda) Recheck your sugar 15 min after treating low to make sure it is more than 70 If sugar is still less than 70, treat again with 15 grams of carbohydrate          Don't drive the hour of hypoglycemia  If unconscious/unable to eat or drink by mouth, use glucagon injection or nasal spray baqsimi and call 911. Can repeat again in 15 min if still unconscious.  Return in about 6 weeks (around 06/03/2023).   I have  reviewed current medications, nurse's notes, allergies, vital signs, past medical and surgical history, family medical history, and social history for this encounter. Counseled patient on symptoms, examination findings, lab findings, imaging results, treatment decisions and monitoring and prognosis. The patient understood the recommendations and agrees with the treatment plan. All questions regarding treatment plan were fully answered.  Karen [place], MD  04/22/23   History of Present Illness [name] is a 58 y.o. year old female who presents for follow up of Type II diabetes mellitus.  [name] was first diagnosed in 1989.   Diabetes education +  Home diabetes regimen: Toujeo 34 units daily in a.m. Humalog: 9 for breakfast, 13 for lunch, and 9 for dinner (13 units if BG>200, otherwise it is 9 units)  Trulicity 3 mg/week   Background history:   She thinks she was initially treated with diet alone and subsequently given metformin With metformin she had nausea and diarrhea but she does not know if she was treated with any other medications until she was started on insulin in 2003.  Also was on glipizide in 2015 Most likely she was on Levemir or Lantus insulin for quite some time She was then switched to Toujeo in 2016 and although she was initially prescribed 90 units twice a day this was subsequently lowered  Her A1c has been occasionally between 7.2 and 7.5 but has been consistently over 9% since 2018  PREVIOUS OMNIPOD PUMP settings with Humalog U-200:  Basal settings: 12 AM = 2.6, 3 AM = 2.4, 9 AM = 2.7, 3 PM = 3.0 Sensitivity 1: 60 and target 140  Max bolus 6 units Active insulin time 6 hours  COMPLICATIONS +  MI/Stroke -  retinopathy -  neuropathy -  nephropathy  BLOOD SUGAR DATA  CGM interpretation: At today's visit, we reviewed her CGM downloads. The full report is scanned in the media. Reviewing the CGM trends, BG are mildly elevated across the  day.   Physical Exam  BP 136/88   Pulse 89   Ht 5\' 6"  (1.676 m)   Wt 245 lb (111.1 kg)   LMP  (LMP Unknown)   SpO2 96%   BMI 39.54 kg/m    Constitutional: well developed, well nourished Head: normocephalic, atraumatic Eyes: sclera anicteric, no redness Neck: supple Lungs: normal respiratory effort Neurology: alert and oriented Skin: dry, no appreciable rashes Musculoskeletal: no appreciable defects Psychiatric: normal mood and affect Diabetic Foot Exam - Simple   No data filed      Current Medications Patient's Medications  New Prescriptions   DULAGLUTIDE (TRULICITY) 4.5 MG/0.5ML SOAJ    Inject 4.5 mg as directed once a week.  Previous Medications   ACCU-CHEK GUIDE TEST TEST STRIP    USE 1 STRIP IN THE MORNING, AT  NOON, AND AT BEDTIME   ALBUTEROL (ACCUNEB) 1.25 MG/3ML NEBULIZER SOLUTION    USE 1 VIAL VIA NEBULIZER TWICE  DAILY   ALBUTEROL (VENTOLIN HFA) 108 (90  BASE) MCG/ACT INHALER    USE 1 TO 2 INHALATIONS BY MOUTH  EVERY 6 HOURS AS NEEDED   AMITRIPTYLINE (ELAVIL) 25 MG TABLET    Take 2-3 tablets (50-75 mg total) by mouth at bedtime.   AMLODIPINE (NORVASC) 10 MG TABLET    TAKE 1 TABLET BY MOUTH DAILY   BUPRENORPHINE (BUTRANS) 7.5 MCG/HR    Place 1 patch onto the skin once a week.   BUTALBITAL-ACETAMINOPHEN-CAFFEINE (FIORICET) 50-325-40 MG TABLET    Take 1-2 tablets by mouth every 6 (six) hours as needed for headache.   CARVEDILOL (COREG) 25 MG TABLET    Take 25 mg by mouth 2 (two) times daily.   CHOLECALCIFEROL (VITAMIN D3) 25 MCG (1000 UNIT) TABLET    Take 1 tablet (1,000 Units total) by mouth daily.   CLOPIDOGREL (PLAVIX) 75 MG TABLET    Take 1 tablet (75 mg total) by mouth daily.   CONTINUOUS GLUCOSE SENSOR (FREESTYLE LIBRE 3 PLUS SENSOR) MISC    Inject 1 Device into the skin continuous. Change every 14 days   CONTINUOUS GLUCOSE SENSOR (FREESTYLE LIBRE 3 SENSOR) MISC    1 Device by Does not apply route continuous.   CYCLOSPORINE (RESTASIS) 0.05 % OPHTHALMIC EMULSION     Place 2 drops into both eyes 2 (two) times daily.   DULOXETINE (CYMBALTA) 60 MG CAPSULE    TAKE 1 CAPSULE BY MOUTH EVERY DAY   EPINEPHRINE 0.3 MG/0.3 ML IJ SOAJ INJECTION    INJECT 1 PEN INTRAMUSCULARLY AS  NEEDED FOR ALLERGIC RESPONSE AS  DIRECTED BY MD. Assunta Found MEDICAL  HELP AFTER USE   EZETIMIBE (ZETIA) 10 MG TABLET    Take 1 tablet (10 mg total) by mouth daily.   FUROSEMIDE (LASIX) 20 MG TABLET    TAKE 1 TABLET BY MOUTH DAILY   GABAPENTIN (NEURONTIN) 600 MG TABLET    600 mg qAM, 600 mg qPM, 1200 mg QHS   GLUCAGON (GVOKE HYPOPEN 2-PACK) 1 MG/0.2ML SOAJ    Inject 1 mg into the skin once as needed for up to 1 dose (hypoglycemia). Only for severe low blood sugar not responding to drinking juice/glucose tablets, when CBG is <70   HYDRALAZINE (APRESOLINE) 50 MG TABLET    Take 1 tablet by mouth 3 (three) times daily.   INSULIN GLARGINE, 2 UNIT DIAL, (TOUJEO MAX SOLOSTAR) 300 UNIT/ML SOLOSTAR PEN    Inject 33 Units into the skin at bedtime.   INSULIN LISPRO (HUMALOG KWIKPEN) 100 UNIT/ML KWIKPEN    9u in the a.m.  13u lunchtime 9 u at bedtime.  Sliding scale   INSULIN PEN NEEDLE (PEN NEEDLES) 32G X 4 MM MISC    1 Device by Does not apply route in the morning, at noon, in the evening, and at bedtime.   LEVETIRACETAM (KEPPRA) 500 MG TABLET    Take 500-750 mg by mouth See admin instructions. Take 1 tablet (500mg ) by mouth every morning and take 1 tablets by mouth (750mg ) by mouth every night   MECLIZINE (ANTIVERT) 25 MG TABLET    Take 1 tablet (25 mg total) by mouth 3 (three) times daily as needed for dizziness.   MIRABEGRON ER (MYRBETRIQ) 50 MG TB24 TABLET    Take 1 tablet (50 mg total) by mouth daily.   OLMESARTAN-HYDROCHLOROTHIAZIDE (BENICAR HCT) 40-25 MG TABLET    Take 1 tablet by mouth daily.   PANTOPRAZOLE (PROTONIX) 40 MG TABLET    Take 1 tablet (40 mg total) by mouth daily as needed.   POLYETHYLENE GLYCOL (  MIRALAX / GLYCOLAX) 17 G PACKET    Take 17 g by mouth daily as needed for mild constipation.    ROSUVASTATIN (CRESTOR) 40 MG TABLET    TAKE 1 TABLET BY MOUTH DAILY   SENNA-DOCUSATE (SENOKOT-S) 8.6-50 MG TABLET    Take 1 tablet by mouth at bedtime as needed for mild constipation.   SPIRONOLACTONE (ALDACTONE) 25 MG TABLET    Take 25 mg by mouth daily.   TIZANIDINE (ZANAFLEX) 4 MG TABLET    Take 1 tablet (4 mg total) by mouth every 8 (eight) hours as needed for muscle spasms.   UBROGEPANT (UBRELVY) 50 MG TABS    50 mg or 100 mg taken orally with or without food. If needed, a second dose may be taken at least 2 hours after the initial dose. The maximum dose in a 24-hour period is 200 mg   WIXELA INHUB 250-50 MCG/DOSE AEPB    Inhale 1 puff into the lungs 2 (two) times daily.  Modified Medications   No medications on file  Discontinued Medications   DULAGLUTIDE (TRULICITY) 3 MG/0.5ML SOAJ    Inject 3 mg as directed once a week.    Allergies Allergies  Allergen Reactions   Codeine Swelling and Other (See Comments)    Swelling and burning of mouth (inside)   [place] [Dapagliflozin] Other (See Comments)    Recurrent UTI with farxiga   Lactose Intolerance (Gi) Nausea And Vomiting    Past Medical History Past Medical History:  Diagnosis Date   Anxiety and depression 09/13/2006   Qualifier: Diagnosis of  By: Daphine Deutscher [name], [place]     Arthritis    joint pain    Asthma    COPD (chronic obstructive pulmonary disease) (HCC)    chronic bronchitis    Depression    Diabetes mellitus    since age 38; type 2 IDDM   Diabetic neuropathy, painful (HCC)    FEET AND HANDS   Diabetic retinopathy (HCC)    Diastolic dysfunction    a.  Echo 11/17: EF 60-65%, mild LVH, no RWMA, Gr1DD, mild AI, dilated aortic root measuring 38 mm, mildly dilated ascending aorta   Dilated aortic root (HCC)    38mm by echo 11/2015   Dizziness    secondary to diabetes and hypertension    Epilepsy idiopathic petit mal (HCC)    last seizure 2012;controlled w/ topomax   GERD (gastroesophageal reflux disease)     Glaucoma    NOT ON ANY EYE DROPS    Headache(784.0)    migraines    Heart murmur    born with    History of stress test    a. 11/17: Normal perfusion, EF 53%, normal study   Hx of blood clots    hematomas removed from left side of brain from 11mos to 59yrs old    Hyperlipidemia    Hypertension    Legally blind    left eye    MIGRAINE HEADACHE 09/14/2006   Qualifier: Diagnosis of  By: Daphine Deutscher [name], [place]     Myocardial infarction [facility])    a.  Patient reported history of without objective documentation   Nephrolithiasis    frequent urination , urination at nite  PT SEEN IN ER 09/22/11 FOR BACK AND RT SIDED PAIN--HAS KNOWN STONE RT URETER AND UA IN ER SHOWED UTI   Pain 09/23/2011   LOWER BACK AND RIGHT SIDE--PT HAS RIGHT URETERAL STONE   Pneumonia    hx of 2009   Pyelonephritis  SBO (small bowel obstruction) (HCC) 04/23/2021   Seizures (HCC)    Sleep apnea    [facility];does not use Cpap ; mild   Small bowel obstruction (HCC) 05/18/2021   Stress incontinence    Stroke (HCC)    last 2003  RESIDUAL LEFT LEG WEAKNESS--NO OTHER RESIDUAL PROBLEMS    Past Surgical History Past Surgical History:  Procedure Laterality Date   BRAIN HEMATOMA EVACUATION     five procedures total, first procedure when 83 months old, last at 58 years of age.   CYSTOSCOPY W/ RETROGRADES  09/24/2011   Procedure: CYSTOSCOPY WITH RETROGRADE PYELOGRAM;  Surgeon: Anner Crete, MD;  Location: [facility];  Service: Urology;  Laterality: Right;   CYSTOSCOPY WITH URETEROSCOPY  09/24/2011   Procedure: CYSTOSCOPY WITH URETEROSCOPY;  Surgeon: Anner Crete, MD;  Location: [facility];  Service: Urology;  Laterality: Right;  Balloon dilation right ureter    CYSTOSCOPY/RETROGRADE/URETEROSCOPY  08/24/2011   Procedure: CYSTOSCOPY/RETROGRADE/URETEROSCOPY;  Surgeon: Milford Cage, MD;  Location: [facility];  Service: Urology;  Laterality: Right;  Cysto, Right retrograde Pyelogram, right stent placement.     INCISIONAL HERNIA REPAIR  05/05/2011   Procedure: LAPAROSCOPIC INCISIONAL HERNIA;  Surgeon: Emelia Loron, MD;  Location: [facility] OR;  Service: General;  Laterality: N/A;   kidney stone removal     LAPAROTOMY N/A 06/12/2021   Procedure: EXPLORATORY LAPAROTOMY, LYSIS OF ADHESIONS;  Surgeon: Henrene Dodge, MD;  Location: [facility] ORS;  Service: General;  Laterality: N/A;   MASS EXCISION  05/05/2011   Procedure: EXCISION MASS;  Surgeon: Emelia Loron, MD;  Location: [facility] OR;  Service: General;  Laterality: Right;   [facility]     RETINAL DETACHMENT SURGERY Left 1990   RIGHT/LEFT HEART CATH AND CORONARY ANGIOGRAPHY N/A 04/28/2019   Procedure: RIGHT/LEFT HEART CATH AND CORONARY ANGIOGRAPHY;  Surgeon: Alwyn Pea, MD;  Location: [facility] INVASIVE CV LAB;  Service: Cardiovascular;  Laterality: N/A;   SHUNT REMOVAL     shunt inserted at age 43 removed at age 33    VAGINAL HYSTERECTOMY  42    Family History family history includes ADD / ADHD in her maternal grandfather; Birth defects in her daughter and maternal grandfather; Brain cancer in her maternal grandmother; Cancer in her mother; Cirrhosis in her maternal grandfather; Diabetes in her maternal grandfather; Hypertension in her daughter, maternal grandmother, and mother; Uterine cancer in her mother.  Social History Social History   Socioeconomic History   Marital status: Widowed    Spouse name: Not on file   Number of children: 2   Years of education: Not on file   Highest education level: 12th grade  Occupational History   Occupation: disabled  Tobacco Use   Smoking status: Former    Current packs/day: 0.00    Average packs/day: 1 pack/day for 15.0 years (15.0 ttl pk-yrs)    Types: Cigarettes    Start date: 01/20/1972    Quit date: 01/20/1987    Years since quitting: 36.2   Smokeless tobacco: Never   Tobacco comments:    quit more than 20 years  Vaping Use   Vaping status: Never Used  Substance and Sexual Activity   Alcohol use: Yes     Alcohol/week: 1.0 standard drink of alcohol    Types: 1 Glasses of wine per week   Drug use: No    Comment: hx of marijuana use no longer uses    Sexual activity: Yes    Partners: Male    Birth  control/protection: Surgical  Other Topics Concern   Not on file  Social History Narrative   Pt lives with her daughter   Social Drivers of Health   Financial Resource Strain: Low Risk  (04/01/2023)   Overall Financial Resource Strain (CARDIA)    Difficulty of Paying Living Expenses: Not hard at all  Food Insecurity: No Food Insecurity (04/01/2023)   Hunger Vital Sign    Worried About Running Out of Food in the Last Year: Never true    Ran Out of Food in the Last Year: Never true  Transportation Needs: No Transportation Needs (04/01/2023)   PRAPARE - Administrator, Civil Service (Medical): No    Lack of Transportation (Non-Medical): No  Physical Activity: Insufficiently Active (04/01/2023)   Exercise Vital Sign    Days of Exercise per Week: 2 days    Minutes of Exercise per Session: 10 min  Stress: No Stress Concern Present (04/01/2023)   [employer] of Occupational Health - Occupational Stress Questionnaire    Feeling of Stress : Not at all  Social Connections: Moderately Isolated (04/01/2023)   Social Connection and Isolation Panel [NHANES]    Frequency of Communication with Friends and Family: More than three times a week    Frequency of Social Gatherings with Friends and Family: Once a week    Attends Religious Services: More than 4 times per year    Active Member of [employer] or Organizations: No    Attends Banker Meetings: Never    Marital Status: Widowed  Intimate Partner Violence: Not At Risk (04/01/2023)   Humiliation, Afraid, Rape, and Kick questionnaire    Fear of Current or Ex-Partner: No    Emotionally Abused: No    Physically Abused: No    Sexually Abused: No    Lab Results  Component Value Date   HGBA1C 11.0 (A) 02/04/2023   HGBA1C 9.7 (H)  07/20/2022   HGBA1C 9.8 (A) 04/16/2022   Lab Results  Component Value Date   CHOL 129 02/16/2023   Lab Results  Component Value Date   HDL 45 (L) 02/16/2023   Lab Results  Component Value Date   LDLCALC 60 02/16/2023   Lab Results  Component Value Date   TRIG 166 (H) 02/16/2023   Lab Results  Component Value Date   CHOLHDL 2.9 02/16/2023   Lab Results  Component Value Date   CREATININE 1.07 (H) 03/15/2023   Lab Results  Component Value Date   GFR 74.92 04/14/2022   Lab Results  Component Value Date   MICROALBUR 3.6 (H) 04/16/2022      Component Value Date/Time   NA 140 03/15/2023 1537   NA 138 05/08/2015 1106   K 4.0 03/15/2023 1537   CL 96 (L) 03/15/2023 1537   CO2 33 (H) 03/15/2023 1537   GLUCOSE 79 03/15/2023 1537   BUN 14 03/15/2023 1537   BUN 14 05/08/2015 1106   CREATININE 1.07 (H) 03/15/2023 1537   CALCIUM 9.9 03/15/2023 1537   PROT 7.1 02/16/2023 0928   PROT 7.7 05/08/2015 1106   ALBUMIN 3.8 08/03/2022 1406   ALBUMIN 4.1 05/08/2015 1106   AST 8 (L) 02/16/2023 0928   ALT 10 02/16/2023 0928   ALKPHOS 99 08/03/2022 1406   BILITOT 0.3 02/16/2023 0928   BILITOT 0.3 05/08/2015 1106   GFRNONAA >60 08/10/2022 0459   GFRNONAA 81 11/28/2018 0000   GFRAA >60 05/30/2019 1543   GFRAA 94 11/28/2018 0000      Latest Ref  Rng & Units 03/15/2023    3:37 PM 02/16/2023    9:28 AM 08/10/2022    4:59 AM  BMP  Glucose 65 - 99 mg/dL 79  130    BUN 7 - 25 mg/dL 14  32    Creatinine 8.65 - 1.03 mg/dL 7.84  6.96  2.95   BUN/Creat Ratio 6 - 22 (calc) 13  24    Sodium 135 - 146 mmol/L 140  137    Potassium 3.5 - 5.3 mmol/L 4.0  4.1    Chloride 98 - 110 mmol/L 96  97    CO2 20 - 32 mmol/L 33  30    Calcium 8.6 - 10.4 mg/dL 9.9  9.4         Component Value Date/Time   WBC 7.5 08/09/2022 0534   RBC 4.02 08/09/2022 0534   HGB 11.8 (L) 08/09/2022 0534   HGB 13.1 05/08/2015 1106   HCT 37.3 08/09/2022 0534   HCT 39.5 05/08/2015 1106   PLT 214 08/09/2022 0534    PLT 274 05/08/2015 1106   MCV 92.8 08/09/2022 0534   MCV 91 05/08/2015 1106   MCH 29.4 08/09/2022 0534   MCHC 31.6 08/09/2022 0534   RDW 14.0 08/09/2022 0534   RDW 14.1 05/08/2015 1106   LYMPHSABS 1.7 08/03/2022 1406   LYMPHSABS 1.7 05/08/2015 1106   MONOABS 0.5 08/03/2022 1406   EOSABS 0.0 08/03/2022 1406   EOSABS 0.0 05/08/2015 1106   BASOSABS 0.0 08/03/2022 1406   BASOSABS 0.0 05/08/2015 1106     Parts of this note may have been dictated using voice recognition software. There may be variances in spelling and vocabulary which are unintentional. Not all errors are proofread. Please notify the Thereasa Parkin if any discrepancies are noted or if the meaning of any statement is not clear.

## 2023-04-23 ENCOUNTER — Other Ambulatory Visit: Payer: Self-pay | Admitting: Pharmacist

## 2023-04-23 ENCOUNTER — Encounter: Payer: Self-pay | Admitting: "Endocrinology

## 2023-04-23 DIAGNOSIS — I10 Essential (primary) hypertension: Secondary | ICD-10-CM

## 2023-04-23 NOTE — Patient Instructions (Signed)
 Goals Addressed             This Visit's Progress    Pharmacy Goals       The goal A1c is less than 7%. This is the best way to reduce the risk of the long term complications of diabetes, including heart disease, kidney disease, eye disease, strokes, and nerve damage. An A1c of less than 7% corresponds with fasting sugars less than 130 and 2 hour after meal sugars less than 180.   Check your blood pressure once daily, and any time you have concerning symptoms like headache, chest pain, dizziness, shortness of breath, or vision changes.   Our goal is less than 130/80.  To appropriately check your blood pressure, make sure you do the following:  1) Avoid caffeine, exercise, or tobacco products for 30 minutes before checking. Empty your bladder. 2) Sit with your back supported in a flat-backed chair. Rest your arm on something flat (arm of the chair, table, etc). 3) Sit still with your feet flat on the floor, resting, for at least 5 minutes.  4) Check your blood pressure. Take 1-2 readings.  5) Write down these readings and bring with you to any provider appointments.  Bring your home blood pressure machine with you to a provider's office for accuracy comparison at least once a year.   Make sure you take your blood pressure medications before you come to any office visit, even if you were asked to fast for labs.  Estelle Grumbles, PharmD, [facility] [phone]

## 2023-04-23 NOTE — Progress Notes (Signed)
   04/23/2023  Patient ID: [name], female   DOB: [birth year], 58 y.o.   MRN: [id number]  Again outreach to [facility] to clarify patient's hypertension medication regimen. Note from review of dispensing history in chart/patient report, appears she has not taken olmesartan, olmesartan-HCTZ or spironolactone in > 1 year  Speak with [place] who states that she will let Dr. Juliann Pares know and provider will remove these medications from active medication list on their end as patient has not been taking.  Call to provide update to patient. Recommend to continue to monitor home blood pressure, keep log of results and have this record to review at upcoming medical appointments. Patient to contact Cardiology or PCP office sooner if needed for readings outside of established parameters or symptoms   Estelle Grumbles, PharmD, [facility] [phone]

## 2023-04-23 NOTE — Patient Instructions (Signed)
Check your blood pressure once daily, and any time you have concerning symptoms like headache, chest pain, dizziness, shortness of breath, or vision changes.   Our goal is less than 130/80.  To appropriately check your blood pressure, make sure you do the following:  1) Avoid caffeine, exercise, or tobacco products for 30 minutes before checking. Empty your bladder. 2) Sit with your back supported in a flat-backed chair. Rest your arm on something flat (arm of the chair, table, etc). 3) Sit still with your feet flat on the floor, resting, for at least 5 minutes.  4) Check your blood pressure. Take 1-2 readings.  5) Write down these readings and bring with you to any provider appointments.  Bring your home blood pressure machine with you to a provider's office for accuracy comparison at least once a year.   Make sure you take your blood pressure medications before you come to any office visit, even if you were asked to fast for labs.  Elisabeth Delles, PharmD, BCACP [facility] [phone]  

## 2023-04-26 ENCOUNTER — Other Ambulatory Visit: Payer: Self-pay | Admitting: Family Medicine

## 2023-04-26 ENCOUNTER — Other Ambulatory Visit: Payer: Self-pay | Admitting: Student in an Organized Health Care Education/Training Program

## 2023-04-26 ENCOUNTER — Ambulatory Visit: Payer: Self-pay | Admitting: *Deleted

## 2023-04-26 ENCOUNTER — Other Ambulatory Visit: Payer: Self-pay | Admitting: Physician Assistant

## 2023-04-26 ENCOUNTER — Other Ambulatory Visit: Payer: Self-pay

## 2023-04-26 MED ORDER — BUTALBITAL-APAP-CAFFEINE 50-325-40 MG PO TABS: 1.0000 | ORAL_TABLET | Freq: Four times a day (QID) | ORAL | 0 refills | Status: AC | PRN

## 2023-04-26 NOTE — Patient Outreach (Addendum)
 Complex Care Management   Visit Note  04/26/2023  Name:  [name] MRN: [id number] DOB: 23-Feb-1965  Situation: Referral received for Complex Care Management related to Diabetes with Complications I obtained verbal consent from Patient.  Visit completed with Patient  on the phone  Background:   Past Medical History:  Diagnosis Date   Anxiety and depression 09/13/2006   Qualifier: Diagnosis of  By: Daphine Deutscher [name], Nykedtra     Arthritis    joint pain    Asthma    COPD (chronic obstructive pulmonary disease) (HCC)    chronic bronchitis    Depression    Diabetes mellitus    since age 18; type 2 IDDM   Diabetic neuropathy, painful (HCC)    FEET AND HANDS   Diabetic retinopathy (HCC)    Diastolic dysfunction    a.  Echo 11/17: EF 60-65%, mild LVH, no RWMA, Gr1DD, mild AI, dilated aortic root measuring 38 mm, mildly dilated ascending aorta   Dilated aortic root (HCC)    38mm by echo 11/2015   Dizziness    secondary to diabetes and hypertension    Epilepsy idiopathic petit mal (HCC)    last seizure 2012;controlled w/ topomax   GERD (gastroesophageal reflux disease)    Glaucoma    NOT ON ANY EYE DROPS    Headache(784.0)    migraines    Heart murmur    born with    History of stress test    a. 11/17: Normal perfusion, EF 53%, normal study   Hx of blood clots    hematomas removed from left side of brain from 11mos to 58yrs old    Hyperlipidemia    Hypertension    Legally blind    left eye    MIGRAINE HEADACHE 09/14/2006   Qualifier: Diagnosis of  By: Daphine Deutscher [name], Nykedtra     Myocardial infarction [facility])    a.  Patient reported history of without objective documentation   Nephrolithiasis    frequent urination , urination at nite  PT SEEN IN ER 09/22/11 FOR BACK AND RT SIDED PAIN--HAS KNOWN STONE RT URETER AND UA IN ER SHOWED UTI   Pain 09/23/2011   LOWER BACK AND RIGHT SIDE--PT HAS RIGHT URETERAL STONE   Pneumonia    hx of 2009   Pyelonephritis    SBO (small bowel  obstruction) (HCC) 04/23/2021   Seizures (HCC)    Sleep apnea    [facility];does not use Cpap ; mild   Small bowel obstruction (HCC) 05/18/2021   Stress incontinence    Stroke (HCC)    last 2003  RESIDUAL LEFT LEG WEAKNESS--NO OTHER RESIDUAL PROBLEMS    Assessment: Patient report doing better as far as glucose management since Trulicity was increased.  State goal is to come off of Humalog.  Daughter working on getting FMLA paperwork for her new job.   Patient Reported Symptoms:  Cognitive Alert and oriented to person, place, and time  Neurological No symptoms reported    HEENT No symptoms reported    Cardiovascular No symptoms reported    Respiratory No symptoms reported    Endocrine No symptoms reported    Gastrointestinal No symptoms reported    Genitourinary No symptoms reported    Integumentary No symptoms reported    Musculoskeletal No symptoms reported    Psychosocial No symptoms reported     Vitals:   04/26/23 1440  BP: 130/70    Medications Reviewed Today     Reviewed  by Rodney Langton, RN (Registered Nurse) on 04/26/23 at 1450  Med List Status: <None>   Medication Order Taking? Sig Documenting Provider Last Dose Status Informant  ACCU-CHEK GUIDE TEST test strip [id number] Yes USE 1 STRIP IN THE MORNING, AT  NOON, AND AT BEDTIME [name], [name], MD Taking Active   albuterol (ACCUNEB) 1.25 MG/3ML nebulizer solution [id number] Yes USE 1 VIAL VIA NEBULIZER TWICE  DAILY [name], [name] Taking Active   albuterol (VENTOLIN HFA) 108 (90 Base) MCG/ACT inhaler [id number] Yes USE 1 TO 2 INHALATIONS BY MOUTH  EVERY 6 HOURS AS NEEDED [name], [name] Taking Active   amitriptyline (ELAVIL) 25 MG tablet [id number] Yes Take 2-3 tablets (50-75 mg total) by mouth at bedtime. Edward Jolly, MD Taking Active   amLODipine (NORVASC) 10 MG tablet [id number] Yes TAKE 1 TABLET BY MOUTH DAILY [name], [name] Taking Active   buprenorphine (BUTRANS) 7.5 MCG/HR  [id number] Yes Place 1 patch onto the skin once a week. Edward Jolly, MD Taking Active   butalbital-acetaminophen-caffeine (FIORICET) 50-325-40 MG tablet [id number] Yes Take 1-2 tablets by mouth every 6 (six) hours as needed for headache. Edward Jolly, MD Taking Active Multiple Informants, Self, Pharmacy Records  carvedilol (COREG) 25 MG tablet [id number] Yes Take 25 mg by mouth 2 (two) times daily. [provider] Taking Active Multiple Informants, Self, Pharmacy Records  cholecalciferol (VITAMIN D3) 25 MCG (1000 UNIT) tablet [id number] Yes Take 1 tablet (1,000 Units total) by mouth daily. [name], [name] Taking Active   clopidogrel (PLAVIX) 75 MG tablet [id number] Yes Take 1 tablet (75 mg total) by mouth daily. Jamelle Haring, MD Taking Active Multiple Informants, Self, Pharmacy Records  Continuous Glucose Sensor (FREESTYLE LIBRE 3 PLUS SENSOR) [place] [id number] Yes Inject 1 Device into the skin continuous. Change every 14 days Altamese Mio, MD Taking Active   Continuous Glucose Sensor (FREESTYLE LIBRE 3 SENSOR) [place] [id number] Yes 1 Device by Does not apply route continuous. Altamese [place], MD Taking Active   cycloSPORINE (RESTASIS) 0.05 % ophthalmic emulsion [id number] Yes Place 2 drops into both eyes 2 (two) times daily. [provider] Taking Active Multiple Informants, Self, Pharmacy Records           Med Note Dollene Primrose   Wed Nov 13, 2020 10:05 AM)    Dulaglutide (TRULICITY) 4.5 MG/0.5ML Ivory Broad [id number] Yes Inject 4.5 mg as directed once a week. Altamese [place], MD Taking Active   DULoxetine (CYMBALTA) 60 MG capsule [id number] Yes TAKE 1 CAPSULE BY MOUTH EVERY DAY Berniece Salines, [name] Taking Active   EPINEPHrine 0.3 mg/0.3 mL IJ SOAJ injection [id number] Yes INJECT 1 PEN INTRAMUSCULARLY AS  NEEDED FOR ALLERGIC RESPONSE AS  DIRECTED BY MD. [place]ta Found MEDICAL  HELP AFTER USE [name], [name] Taking Active   ezetimibe (ZETIA) 10 MG tablet [id number] Yes Take 1 tablet (10 mg  total) by mouth daily. Berniece Salines, [name] Taking Active   furosemide (LASIX) 20 MG tablet [id number] Yes TAKE 1 TABLET BY MOUTH DAILY [name], [name] Taking Active   gabapentin (NEURONTIN) 600 MG tablet [id number] Yes 600 mg qAM, 600 mg qPM, 1200 mg QHS Edward Jolly, MD Taking Active   Glucagon (GVOKE HYPOPEN 2-PACK) 1 MG/0.2ML SOAJ [id number] Yes Inject 1 mg into the skin once as needed for up to 1 dose (hypoglycemia). Only for severe low blood sugar not responding to drinking juice/glucose tablets, when CBG is <70 [name], [name], MD Taking Active   hydrALAZINE (APRESOLINE) 50 MG tablet [id number] Yes Take 1 tablet by  mouth 3 (three) times daily. [provider] Taking Active Multiple Informants, Self, Pharmacy Records  insulin glargine, 2 Unit Dial, (TOUJEO MAX SOLOSTAR) 300 UNIT/ML Solostar Pen [id number] Yes Inject 33 Units into the skin at bedtime. Altamese [place], MD Taking Active   insulin lispro (HUMALOG KWIKPEN) 100 UNIT/ML KwikPen [id number] Yes 9u in the a.m.  13u lunchtime 9 u at bedtime.  Sliding scale [name], [name], MD Taking Active   Insulin Pen Needle (PEN NEEDLES) 32G X 4 MM MISC [id number] Yes 1 Device by Does not apply route in the morning, at noon, in the evening, and at bedtime. Altamese [place], MD Taking Active   levETIRAcetam (KEPPRA) 500 MG tablet [id number] Yes Take 500-750 mg by mouth See admin instructions. Take 1 tablet (500mg ) by mouth every morning and take 1 tablets by mouth (750mg ) by mouth every night [provider] Taking Active Multiple Informants, Self, Pharmacy Records  meclizine (ANTIVERT) 25 MG tablet [id number] Yes Take 1 tablet (25 mg total) by mouth 3 (three) times daily as needed for dizziness. Trinna Post, MD Taking Active Multiple Informants, Self, Pharmacy Records  mirabegron ER [facility]) 50 MG TB24 tablet [id number] Yes Take 1 tablet (50 mg total) by mouth daily. Berniece Salines, [name] Taking Active   pantoprazole (PROTONIX) 40 MG tablet  [id number] Yes Take 1 tablet (40 mg total) by mouth daily as needed. [name], [name] Taking Active   polyethylene glycol (MIRALAX / GLYCOLAX) 17 g packet [id number] Yes Take 17 g by mouth daily as needed for mild constipation. Meredeth Ide, MD Taking Active Multiple Informants, Self, Pharmacy Records  rosuvastatin (CRESTOR) 40 MG tablet [id number] Yes TAKE 1 TABLET BY MOUTH DAILY [name], [name], MD Taking Active   senna-docusate (SENOKOT-S) 8.6-50 MG tablet [id number] Yes Take 1 tablet by mouth at bedtime as needed for mild constipation. Meredeth Ide, MD Taking Active Multiple Informants, Self, Pharmacy Records  tiZANidine (ZANAFLEX) 4 MG tablet [id number] Yes Take 1 tablet (4 mg total) by mouth every 8 (eight) hours as needed for muscle spasms. Edward Jolly, MD Taking Active   Ubrogepant (UBRELVY) 50 MG TABS [id number] Yes 50 mg or 100 mg taken orally with or without food. If needed, a second dose may be taken at least 2 hours after the initial dose. The maximum dose in a 24-hour period is 200 mg Edward Jolly, MD Taking Active   WIXELA INHUB 250-50 MCG/DOSE AEPB [id number] Yes Inhale 1 puff into the lungs 2 (two) times daily. [provider] Taking Active Multiple Informants, Self, Pharmacy Records  Med List Note Valerie Salts, RN 09/14/22 1443): 12-06-18 UDS Oxy/Acet approved through 01-19-20 Mr 03-10-2021 09-14-2022 PA for Buprenorphine patch done via [facility] [id number]  kt            Recommendation:   PCP Follow-up Specialty provider follow-up 5/15  Follow Up Plan:   Telephone follow-up in 1 month Telephone follow up appointment date/time:  5/19 with D. Chilton Si, RNCM, patient aware  Rodney Langton, RN, MSN, CCM [facility]  [facility], [facility] RN Care Coordinator Direct Dial: [phone] / Main [phone] Fax [phone] Email: [email or web address] Website: [email or web address]

## 2023-04-26 NOTE — Patient Instructions (Signed)
 Visit Information  Thank you for taking time to visit with me today. Please don't hesitate to contact me if I can be of assistance to you before our next scheduled appointment.  Our next appointment is by telephone on 5/19 at 2pm with D. Chilton Si, RNCM  Please call the care guide team at [phone] if you need to cancel or reschedule your appointment.   Following is a copy of your care plan:   Goals Addressed             This Visit's Progress    COMPLETED: Management of chronic medical conditions (DM, HTN, CHF)       Interventions Today    Flowsheet Row Most Recent Value  Chronic Disease   Chronic disease during today's visit Congestive Heart Failure (CHF), Diabetes  General Interventions   General Interventions Discussed/Reviewed General Interventions Reviewed, Doctor Visits, Annual Eye Exam, Annual Foot Exam, Labs  Labs Hgb A1c every 3 months  [Will have repeat labs with next Endo visit]  Doctor Visits Discussed/Reviewed Annual Wellness Visits, Doctor Visits Reviewed, Specialist, PCP  [Upcoming AWV on 3/13 and endocrinology 4/3]  PCP/Specialist Visits Compliance with follow-up visit  Education Interventions   Education Provided Provided Education  Provided Verbal Education On Blood Sugar Monitoring, Nutrition, Exercise, Medication, When to see the doctor  [facility] reviewed, report compliance. State blood sugar better, range 140-200, discussed changing diet and increasing exercise to help with management]           VBCI RN Care Plan       Problems:  Knowledge Deficits related to DMII  Goal: Over the next 45 days the Patient will attend all scheduled medical appointments: pain clinic 4/14, pharmacy team 5/14, endocrinology 5/15 as evidenced by completed office visits in EMR        continue to work with RN Care Manager and/or Social Worker to address care management and care coordination needs related to DMII as evidenced by adherence to CM Team Scheduled appointments      demonstrate Improved health management independence as evidenced by decreased A1C        take all medications exactly as prescribed and will call provider for medication related questions as evidenced by Patient reported adherence    work with pharmacist to address Limited education about management of DM* related to DMII as evidenced by review of EMR and patient or pharmacist report     Interventions:   Diabetes Interventions:  (Status:  New goal.) Long Term Goal Assessed patient's understanding of A1c goal: <8% Provided education to patient about basic DM disease process Reviewed medications with patient and discussed importance of medication adherence Counseled on importance of regular laboratory monitoring as prescribed Discussed plans with patient for ongoing care management follow up and provided patient with direct contact information for care management team Reviewed scheduled/upcoming provider appointments including: see above Screening for signs and symptoms of depression related to chronic disease state  Assessed social determinant of health barriers Lab Results  Component Value Date   HGBA1C 11.0 (A) 02/04/2023    Patient Self-Care Activities:  Attend all scheduled provider appointments Take medications as prescribed   Work with the pharmacist to address medication management needs and will continue to work with the clinical team to address health care and disease management related needs enter blood sugar readings and medication or insulin into daily log take the blood sugar log to all doctor visits manage portion size  Plan:  The patient has been provided with contact  information for the care management team and has been advised to call with any health related questions or concerns.              Please call the Suicide and Crisis Lifeline: 988 call the [place] National Suicide Prevention Lifeline: 650-372-9044 or TTY: 442 079 3407 TTY (765) 188-9034) to talk to a  trained counselor call 1-800-273-TALK (toll free, 24 hour hotline) call 911 if you are experiencing a Mental Health or Behavioral Health Crisis or need someone to talk to.  Patient verbalizes understanding of instructions and care plan provided today and agrees to view in MyChart. Active MyChart status and patient understanding of how to access instructions and care plan via MyChart confirmed with patient.     Rodney Langton, RN, MSN, CCM [facility]: [facility], [facility] RN Care Coordinator Direct Dial: [phone] / Main [phone] Fax [phone] Email: [email or web address]: [email or web address]

## 2023-05-06 ENCOUNTER — Other Ambulatory Visit: Payer: Self-pay | Admitting: Nurse Practitioner

## 2023-05-06 DIAGNOSIS — N3281 Overactive bladder: Secondary | ICD-10-CM

## 2023-05-06 DIAGNOSIS — E1169 Type 2 diabetes mellitus with other specified complication: Secondary | ICD-10-CM

## 2023-05-07 NOTE — Telephone Encounter (Signed)
 Requested Prescriptions  Refused Prescriptions Disp Refills   MYRBETRIQ  50 MG TB24 tablet [Pharmacy Med Name: Myrbetriq  50 MG Oral Tablet Extended Release 24 Hour] 100 tablet 2    Sig: TAKE 1 TABLET BY MOUTH DAILY     Urology: Bladder Agents - mirabegron  Failed - 05/07/2023 12:01 PM      Failed - Cr in normal range and within 360 days    Creat  Date Value Ref Range Status  03/15/2023 1.07 (H) 0.50 - 1.03 mg/dL Final   Creatinine,U  Date Value Ref Range Status  04/16/2022 125.4 mg/dL Final   Creatinine, Urine  Date Value Ref Range Status  11/17/2021 25 20 - 275 mg/dL Final         Failed - AST in normal range and within 360 days    AST  Date Value Ref Range Status  02/16/2023 8 (L) 10 - 35 U/L Final         Passed - ALT in normal range and within 360 days    ALT  Date Value Ref Range Status  02/16/2023 10 6 - 29 U/L Final         Passed - eGFR is 15 or above and within 360 days    GFR, Est African American  Date Value Ref Range Status  11/28/2018 94 > OR = 60 mL/min/1.60m2 Final   GFR calc Af Amer  Date Value Ref Range Status  05/30/2019 >60 >60 mL/min Final   GFR, Est Non African American  Date Value Ref Range Status  11/28/2018 81 > OR = 60 mL/min/1.80m2 Final   GFR, Estimated  Date Value Ref Range Status  08/10/2022 >60 >60 mL/min Final    Comment:    (NOTE) Calculated using the CKD-EPI Creatinine Equation (2021) Performed at [facility], [address] [place], [place] [address]    GFR  Date Value Ref Range Status  04/14/2022 74.92 >60.00 mL/min Final    Comment:    Calculated using the CKD-EPI Creatinine Equation (2021)   eGFR  Date Value Ref Range Status  03/15/2023 60 > OR = 60 mL/min/1.64m2 Final         Passed - Last BP in normal range    BP Readings from Last 1 Encounters:  04/26/23 130/70         Passed - Valid encounter within last 12 months    Recent Outpatient Visits           1 month ago AKI (acute kidney injury)  [facility])   [facility] Adeline Hone, [name]   10 years ago HYPERTENSION   [facility] [place]. [facility] Rai, Ripudeep K, MD   10 years ago Depressive disorder, not elsewhere classified   [facility] Of . [facility] Ghimire, Estil Heman, MD       Future Appointments             In 4 months Adeline Hone, [name] [facility]             ezetimibe  (ZETIA ) 10 MG tablet [Pharmacy Med Name: Ezetimibe  10 MG Oral Tablet] 100 tablet 2    Sig: TAKE 1 TABLET BY MOUTH DAILY     Cardiovascular:  Antilipid - Sterol Transport Inhibitors Failed - 05/07/2023 12:01 PM      Failed - AST in normal range and within 360 days  AST  Date Value Ref Range Status  02/16/2023 8 (L) 10 - 35 U/L Final         Failed - Lipid Panel in normal range within the last 12 months    Cholesterol, Total  Date Value Ref Range Status  05/08/2015 167 100 - 199 mg/dL Final   Cholesterol  Date Value Ref Range Status  02/16/2023 129 <200 mg/dL Final   LDL Cholesterol (Calc)  Date Value Ref Range Status  02/16/2023 60 mg/dL (calc) Final    Comment:    Reference range: <100 . Desirable range <100 mg/dL for primary prevention;   <70 mg/dL for patients with CHD or diabetic patients  with > or = 2 CHD risk factors. Aaron Aas LDL-C is now calculated using the Martin-Hopkins  calculation, which is a validated novel method providing  better accuracy than the Friedewald equation in the  estimation of LDL-C.  Melinda Sprawls et al. Erroll Heard. 1610;960(45): 2061-2068  (http://education.[employer].com/faq/FAQ164)    HDL  Date Value Ref Range Status  02/16/2023 45 (L) > OR = 50 mg/dL Final  40/98/1191 48 >47 mg/dL Final   Triglycerides  Date Value Ref Range Status  02/16/2023 166 (H) <150 mg/dL Final         Passed - ALT in normal range and within 360 days    ALT  Date Value Ref  Range Status  02/16/2023 10 6 - 29 U/L Final         Passed - Patient is not pregnant      Passed - Valid encounter within last 12 months    Recent Outpatient Visits           1 month ago AKI (acute kidney injury) [facility])   [facility] Adeline Hone, [name]   10 years ago HYPERTENSION   [place] [facility] [place] [facility] Of [place]. [facility] Rai, Ripudeep K, MD   10 years ago Depressive disorder, not elsewhere classified   [place] [facility] - [facility] Dept Of [place]. [facility] Ghimire, Estil Heman, MD       Future Appointments             In 4 months Adeline Hone, [name] [facility], [facility]

## 2023-05-08 DIAGNOSIS — Z8673 Personal history of transient ischemic attack (TIA), and cerebral infarction without residual deficits: Secondary | ICD-10-CM | POA: Diagnosis not present

## 2023-05-08 DIAGNOSIS — G4733 Obstructive sleep apnea (adult) (pediatric): Secondary | ICD-10-CM | POA: Diagnosis not present

## 2023-05-08 DIAGNOSIS — J449 Chronic obstructive pulmonary disease, unspecified: Secondary | ICD-10-CM | POA: Diagnosis not present

## 2023-05-08 DIAGNOSIS — I10 Essential (primary) hypertension: Secondary | ICD-10-CM | POA: Diagnosis not present

## 2023-05-11 ENCOUNTER — Other Ambulatory Visit: Payer: Self-pay | Admitting: "Endocrinology

## 2023-05-11 ENCOUNTER — Other Ambulatory Visit: Payer: Self-pay | Admitting: Family Medicine

## 2023-05-11 ENCOUNTER — Other Ambulatory Visit: Payer: Self-pay | Admitting: Student in an Organized Health Care Education/Training Program

## 2023-05-11 DIAGNOSIS — M25551 Pain in right hip: Secondary | ICD-10-CM

## 2023-05-11 DIAGNOSIS — Z794 Long term (current) use of insulin: Secondary | ICD-10-CM

## 2023-05-12 NOTE — Telephone Encounter (Signed)
 Requested Prescriptions  Pending Prescriptions Disp Refills   albuterol  (VENTOLIN  HFA) 108 (90 Base) MCG/ACT inhaler [Pharmacy Med Name: ALBUTEROL  HFA 90MCG/ACT (PA)] 17 g 0    Sig: USE 1 TO 2 INHALATIONS BY MOUTH  EVERY 6 HOURS AS NEEDED     Pulmonology:  Beta Agonists 2 Passed - 05/12/2023 11:10 AM      Passed - Last BP in normal range    BP Readings from Last 1 Encounters:  04/26/23 130/70         Passed - Last Heart Rate in normal range    Pulse Readings from Last 1 Encounters:  04/22/23 89         Passed - Valid encounter within last 12 months    Recent Outpatient Visits           1 month ago AKI (acute kidney injury) [facility])   [facility] Adeline Hone, [name]   10 years ago HYPERTENSION   [place] [facility] [place]. [facility] Rai, Ripudeep K, MD   10 years ago Depressive disorder, not elsewhere classified   [place] [facility] - A Dept Of [place]. [facility] Ghimire, Estil Heman, MD       Future Appointments             In 4 months Adeline Hone, [name] [facility], [facility]             albuterol  (ACCUNEB ) 1.25 MG/3ML nebulizer solution [Pharmacy Med Name: ALBUTEROL  NEB 1.25MG /3ML*25] 75 mL 0    Sig: USE 1 VIAL VIA NEBULIZER TWICE  DAILY     Pulmonology:  Beta Agonists 2 Passed - 05/12/2023 11:10 AM      Passed - Last BP in normal range    BP Readings from Last 1 Encounters:  04/26/23 130/70         Passed - Last Heart Rate in normal range    Pulse Readings from Last 1 Encounters:  04/22/23 89         Passed - Valid encounter within last 12 months    Recent Outpatient Visits           1 month ago AKI (acute kidney injury) [facility])   [facility] Adeline Hone, [name]   10 years ago HYPERTENSION   [place] [facility] [place] [facility] [place]. [facility] Rai, Ripudeep K, MD   10 years ago Depressive disorder, not  elsewhere classified   [place] [facility] Of [place]. [facility] Ghimire, Estil Heman, MD       Future Appointments             In 4 months Adeline Hone, [name] [facility], [facility]            Refused Prescriptions Disp Refills   EPINEPHRINE  0.3 mg/0.3 mL IJ SOAJ injection [Pharmacy Med Name: EPINEPHrine  AUTO 0.3MG  (Epipen )] 2 each 1    Sig: INJECT 1 PEN INTRAMUSCULARLY AS  NEEDED FOR ALLERGIC RESPONSE AS  DIRECTED BY MD. [place] Craver MEDICAL  HELP AFTER USE     Immunology: Antidotes Passed - 05/12/2023 11:10 AM      Passed - Valid encounter within last 12 months    Recent Outpatient Visits           1 month ago AKI (acute kidney injury) (HCC)  [facility] Adeline Hone, [place]   10 years ago HYPERTENSION   [place] [facility] [place] [facility] [place]. [facility] Rai, Ripudeep K, MD   10 years ago Depressive disorder, not elsewhere classified   [place] [facility] - [facility] Dept Of [place]. [facility] Ghimire, Estil Heman, MD       Future Appointments             In 4 months Adeline Hone, [name] [facility], [facility]

## 2023-05-12 NOTE — Progress Notes (Unsigned)
 PROVIDER NOTE: Interpretation of information contained herein should be left to medically-trained personnel. Specific patient instructions are provided elsewhere under "Patient Instructions" section of medical record. This document was created in part using AI and STT-dictation technology, any transcriptional errors that may result from this process are unintentional.  Patient: [name]  Service: E/M   PCP: [name], [name], [name]  DOB: Dec 25, 1965  DOS: 05/13/2023  Provider: Cherylin Corrigan, NP  MRN: [id number]  Delivery: Face-to-face  Specialty: Interventional Pain Management  Type: Established Patient  Setting: Ambulatory outpatient facility  Specialty designation: 09  Referring Prov.: Adeline Hone, [name]  Location: Outpatient office facility       HPI  [name], a 58 y.o. year old female, is here today because of her No primary diagnosis found.. [name] primary complain today is No chief complaint on file.   Pain Assessment: Severity of   is reported as a  /10. Location:    / . Onset:  . Quality:  . Timing:  . Modifying factor(s):  Aaron Aas Vitals:  vitals were not taken for this visit.  BMI: Estimated body mass index is 38.9 kg/m as calculated from the following:   Height as of 04/22/23: 5\' 6"  (1.676 m).   Weight as of 04/26/23: 241 lb (109.3 kg). Last encounter: 05/11/2023.  Reason for encounter: {Blank single:19197::"medication management","post-procedure evaluation and assessment","both, medication management and post-procedure evaluation and assessment","evaluation of worsening, or previously known (established) problem","patient-requested evaluation","follow-up evaluation","evaluation for possible interventional PM therapy/treatment","evaluation of new problem","medication management"}. ***  Discussed the use of AI scribe software for clinical note transcription with the patient, who gave verbal consent to proceed.  History of Present Illness           Pharmacotherapy  Assessment  Analgesic: {There is no content from the last Subjective section.}   Monitoring: [place] PMP: PDMP reviewed during this encounter. {Blank single:19197::"Unable to conduct review of the controlled substance reporting system due to technological failure.","     "} Pharmacotherapy: {Blank single:19197::"Opioid-induced constipation (OIC)(K59.03, T40.2X5A)","No side-effects or adverse reactions reported."} Compliance: {Blank single:19197::"Medication agreement violation - unsanctioned acquisition/use of additional opioid-containing medication","No problems identified."} Effectiveness: {Blank single:19197::"Clinically acceptable."}  No notes on file  No results found for: "CBDTHCR" No results found for: "D8THCCBX" No results found for: "D9THCCBX"  UDS:  Summary  Date Value Ref Range Status  12/06/2018 Note  Final    Comment:    ==================================================================== Compliance Drug Analysis, Ur ==================================================================== Test                             Result       Flag       Units Drug Present and Declared for Prescription Verification   Oxycodone                       62           EXPECTED   ng/mg creat   Oxymorphone                    73           EXPECTED   ng/mg creat   Noroxycodone                   322          EXPECTED   ng/mg creat   Noroxymorphone  43           EXPECTED   ng/mg creat    Sources of oxycodone  are scheduled prescription medications.    Oxymorphone, noroxycodone, and noroxymorphone are expected    metabolites of oxycodone . Oxymorphone is also available as a    scheduled prescription medication.   Gabapentin                      PRESENT      EXPECTED   Levetiracetam                   PRESENT      EXPECTED   Baclofen                        PRESENT      EXPECTED   Acetaminophen                   PRESENT      EXPECTED Drug Present not Declared for Prescription Verification    Morphine                        227          UNEXPECTED ng/mg creat    Potential sources of morphine  include administration of codeine or    morphine , use of heroin, or ingestion of poppy seeds. Drug Absent but Declared for Prescription Verification   Butalbital                      Not Detected UNEXPECTED   Duloxetine                      Not Detected UNEXPECTED   Prochlorperazine                Not Detected UNEXPECTED ==================================================================== Test                      Result    Flag   Units      Ref Range   Creatinine              157              mg/dL      >=16 ==================================================================== Declared Medications:  The flagging and interpretation on this report are based on the  following declared medications.  Unexpected results may arise from  inaccuracies in the declared medications.  **Note: The testing scope of this panel includes these medications:  Baclofen   Butalbital  (Fioricet )  Duloxetine  (Cymbalta )  Gabapentin  (Neurontin )  Levetiracetam  (Keppra )  Oxycodone  (Percocet)  Prochlorperazine  (Compazine )  **Note: The testing scope of this panel does not include small to  moderate amounts of these reported medications:  Acetaminophen  (Fioricet )  Acetaminophen  (Percocet)  **Note: The testing scope of this panel does not include the  following reported medications:  Albuterol  (Ventolin  HFA)  Amlodipine   Caffeine  (Fioricet )  Calcium   Cimetidine  (Tagamet )  Clopidogrel  (Plavix )  Cyclosporine  (Restasis )  Dulaglutide   Ezetimibe  (Zetia )  Fluorometholone  Fluticasone  (Advair)  Indeterminate Medication  Insulin  (NovoLog )  Losartan  (Cozaar )  Mirabegron  (Myrbetriq )  Ondansetron  (Zofran )  Pantoprazole  (Protonix )  Polyethylene Glycol (MiraLAX )  Potassium (Klor-Con )  Prednisone  (Deltasone )  Salmeterol (Advair)  Simvastatin  (Zocor )  Sucralfate  (Carafate )  Sulfamethoxazole  (Bactrim )  Terconazole    Trimethoprim  (Bactrim )  Vitamin D  ==================================================================== For clinical consultation, please call [phone]. ====================================================================       ROS  Constitutional: {Blank single:19197::"Denies any fever or chills"}  Gastrointestinal: {Blank single:19197::"No reported hemesis, hematochezia, vomiting, or acute GI distress"} Musculoskeletal: {Blank single:19197::"Denies any acute onset joint swelling, redness, loss of ROM, or weakness"} Neurological: {Blank single:19197::"No reported episodes of acute onset apraxia, aphasia, dysarthria, agnosia, amnesia, paralysis, loss of coordination, or loss of consciousness"}  Medication Review  DULoxetine , Dulaglutide , EPINEPHrine , FreeStyle Libre 3 Plus Sensor, FreeStyle Libre 3 Sensor, Glucagon , Pen Needles, Ubrogepant , albuterol , amLODipine , amitriptyline , buprenorphine , butalbital -acetaminophen -caffeine , carvedilol , cholecalciferol , clopidogrel , cycloSPORINE , ezetimibe , fluticasone -salmeterol, furosemide , gabapentin , glucose blood, hydrALAZINE , insulin  glargine (2 Unit Dial ), insulin  lispro, levETIRAcetam , meclizine , mirabegron  ER, pantoprazole , polyethylene glycol, rosuvastatin , senna-docusate, and tiZANidine   History Review  Allergy: [name] is allergic to codeine, farxiga  [dapagliflozin ], and lactose intolerance (gi). Drug: [name]  reports no history of drug use. Alcohol :  reports current alcohol  use of about 1.0 standard drink of alcohol  per week. Tobacco:  reports that she quit smoking about 36 years ago. Her smoking use included cigarettes. She started smoking about 51 years ago. She has a 15 pack-year smoking history. She has never used smokeless tobacco. Social: [name]  reports that she quit smoking about 36 years ago. Her smoking use included cigarettes. She started smoking about 51 years ago. She has a 15 pack-year smoking history. She has never  used smokeless tobacco. She reports current alcohol  use of about 1.0 standard drink of alcohol  per week. She reports that she does not use drugs. Medical:  has a past medical history of Anxiety and depression (09/13/2006), Arthritis, Asthma, COPD (chronic obstructive pulmonary disease) (HCC), Depression, Diabetes mellitus, Diabetic neuropathy, painful (HCC), Diabetic retinopathy (HCC), Diastolic dysfunction, Dilated aortic root (HCC), Dizziness, Epilepsy idiopathic petit mal (HCC), GERD (gastroesophageal reflux disease), Glaucoma, Headache(784.0), Heart murmur, History of stress test, blood clots, Hyperlipidemia, Hypertension, Legally blind, MIGRAINE HEADACHE (09/14/2006), Myocardial infarction [facility]), Nephrolithiasis, Pain (09/23/2011), Pneumonia, Pyelonephritis, SBO (small bowel obstruction) (HCC) (04/23/2021), Seizures (HCC), Sleep apnea, Small bowel obstruction (HCC) (05/18/2021), Stress incontinence, and Stroke (HCC). Surgical: [name]  has a past surgical history that includes kidney stone removal; Brain hematoma evacuation; Shunt removal; Retinal detachment surgery (Left, 1990); Vaginal hysterectomy (1996); Incisional hernia repair (05/05/2011); Mass excision (05/05/2011); PCNL; Cystoscopy/retrograde/ureteroscopy (08/24/2011); Cystoscopy with ureteroscopy (09/24/2011); Cystoscopy w/ retrogrades (09/24/2011); RIGHT/LEFT HEART CATH AND CORONARY ANGIOGRAPHY (N/A, 04/28/2019); and laparotomy (N/A, 06/12/2021). Family: family history includes ADD / ADHD in her maternal grandfather; Birth defects in her daughter and maternal grandfather; Brain cancer in her maternal grandmother; Cancer in her mother; Cirrhosis in her maternal grandfather; Diabetes in her maternal grandfather; Hypertension in her daughter, maternal grandmother, and mother; Uterine cancer in her mother.  Laboratory Chemistry Profile   Renal Lab Results  Component Value Date   BUN 14 03/15/2023   CREATININE 1.07 (H) 03/15/2023   LABCREA 25  11/17/2021   BCR 13 03/15/2023   GFR 74.92 04/14/2022   GFRAA >60 05/30/2019   GFRNONAA >60 08/10/2022    Hepatic Lab Results  Component Value Date   AST 8 (L) 02/16/2023   ALT 10 02/16/2023   ALBUMIN  3.8 08/03/2022   ALKPHOS 99 08/03/2022   LIPASE 37 07/07/2021    Electrolytes Lab Results  Component Value Date   NA 140 03/15/2023   K 4.0 03/15/2023   CL 96 (L) 03/15/2023   CALCIUM  9.9 03/15/2023   MG 2.1 10/18/2021   PHOS 4.7 (H) 07/07/2021    Bone Lab Results  Component Value Date   VD25OH 33 11/17/2021    Inflammation (CRP: Acute Phase) (ESR: Chronic Phase) Lab Results  Component Value Date  CRP 16.7 (H) 06/14/2021   ESRSEDRATE 65 (H) 06/14/2021   LATICACIDVEN 1.6 08/03/2022         Note: {Blank single:19197::"[name] indicates labs done and monitored by primary care practitioner using a non-[facility] EMR system","No results found under the [employer] electronic medical record","Results made available to patient.","Lab results reviewed and made available to patient.","Lab results reviewed and explained to patient in Layman's terms.","Above Lab results reviewed."}  Recent Imaging Review  CT ABDOMEN PELVIS W CONTRAST CLINICAL DATA:  Concern for bowel obstruction.  EXAM: CT ABDOMEN AND PELVIS WITH CONTRAST  TECHNIQUE: Multidetector CT imaging of the abdomen and pelvis was performed using the standard protocol following bolus administration of intravenous contrast.  RADIATION DOSE REDUCTION: This exam was performed according to the departmental dose-optimization program which includes automated exposure control, adjustment of the mA and/or kV according to patient size and/or use of iterative reconstruction technique.  CONTRAST:  OMNIPAQUE  IOHEXOL  350 MG/ML SOLN  COMPARISON:  CT abdomen pelvis dated 10/15/2021.  FINDINGS: Lower chest: Trace bilateral pleural effusions with minimal bibasilar subpleural atelectasis.  No intra-abdominal free air or  free fluid.  Hepatobiliary: The liver is unremarkable. No biliary dilatation. The gallbladder is unremarkable.  Pancreas: Unremarkable. No pancreatic ductal dilatation or surrounding inflammatory changes.  Spleen: Normal in size without focal abnormality.  Adrenals/Urinary Tract: The left adrenal gland is unremarkable. Stable 2.5 cm right adrenal adenoma. Small nonobstructing bilateral renal calculi measure up to 6 mm in the inferior pole of the right kidney. There is no hydronephrosis on either side. There is symmetric enhancement and excretion of contrast by both kidneys. The visualized ureters and urinary bladder appear unremarkable.  Stomach/Bowel: There is sigmoid diverticulosis without active inflammatory changes. Multiple mildly dilated loops of proximal small bowel measure up to 4.5 cm no discrete transition noted. Findings favored to represent an ileus. Developing obstruction is not excluded. Small-bowel series may provide better evaluation. The appendix is normal.  Vascular/Lymphatic: The abdominal aorta and IVC are unremarkable. No portal venous gas. There is no adenopathy.  Reproductive: Hysterectomy.  No adnexal masses.  Other: Midline vertical anterior pelvic wall incisional scar. Small fat containing umbilical hernia.  Musculoskeletal: Degenerative changes of the spine. No acute osseous pathology.  IMPRESSION: 1. Mildly dilated proximal small bowel favored to represent an ileus. Developing obstruction is not excluded. Small-bowel series may provide better evaluation. 2. Sigmoid diverticulosis. 3. Nonobstructing bilateral renal calculi. No hydronephrosis.  Electronically Signed   By: Angus Bark M.D.   On: 08/08/2022 21:55 DG Abd 1 View CLINICAL DATA:  Abdominal pain and constipation, initial encounter  EXAM: ABDOMEN - 1 VIEW  COMPARISON:  10/18/2021  FINDINGS: Scattered large and small bowel gas is noted. A few mildly dilated loops of small  bowel are noted in the left mid abdomen. This may represent a partial small bowel obstruction or ileus. CT may be helpful for further evaluation. No free air is noted. No bony abnormality is seen.  IMPRESSION: Mildly dilated loops of small bowel on the left suspicious for ileus or early small bowel obstruction. CT may be helpful as clinically indicated.  Electronically Signed   By: Violeta Grey M.D.   On: 08/08/2022 20:24 Note: {Blank single:19197::"No new results found.","No results found under the [facility] electronic medical record.","Imaging results reviewed and explained to patient in Layman's terms.","Results of ordered imaging test(s) reviewed and explained to patient in Layman's terms.","Imaging results reviewed.","Reviewed"} {Blank single:19197::"Results visible under [facility].","Results visible under [facility].","Results visible under [facility]  HC.","Results visible under [facility].","Results visible under "Care Everywhere".","Results made available to patient.","Copy of results provided to patient.","     "}  Physical Exam  General appearance: {general exam:210120802::"Well nourished, well developed, and well hydrated. In no apparent acute distress"} Mental status: {Blank single:19197::"Alert and oriented x 3. Exaggerated physical and/or psychosocial pain behavior perceived.","Alert, oriented x 3 (person, place, & time)"} {Blank single:19197::"[name]'s speech pattern and demeanor seems to suggest oversedation","     "} Respiratory: {Blank single:19197::"Oxygen-dependent COPD","No evidence of acute respiratory distress"} Eyes: {Blank single:19197::"Miotic (pupilary constriction) due to opiate use","Midriatic","Anisocoric","Evidence of ptosis","Pin-point pupils","PERLA"} Vitals: LMP  (LMP Unknown)  BMI: Estimated body mass index is 38.9 kg/m as calculated from the following:   Height as of 04/22/23: 5\' 6"  (1.676 m).   Weight as of 04/26/23: 241 lb (109.3 kg). Ideal: Patient weight  not recorded  Assessment   Diagnosis Status  1. Chronic pain syndrome   2. Diabetic polyneuropathy associated with type 2 diabetes mellitus (HCC)   3. Central pain syndrome   4. Bilateral hip pain    {Problem Stability:19197::"Deteriorating","Having a Flare-up","Improved","Improving","Not improving","Not responding","Persistent","Recurring","Reoccurring","Resolved","Responding","Stable","Unchanged","Unimproved","Worsened","Worsening","Controlled"} {Problem Stability:19197::"Deteriorating","Having a Flare-up","Improved","Improving","Not improving","Not responding","Persistent","Recurring","Reoccurring","Resolved","Responding","Stable","Unchanged","Unimproved","Worsened","Worsening","Controlled"} {Problem Stability:19197::"Deteriorating","Having a Flare-up","Improved","Improving","Not improving","Not responding","Persistent","Recurring","Reoccurring","Resolved","Responding","Stable","Unchanged","Unimproved","Worsened","Worsening","Controlled"}   Plan of Care  Assessment and Plan             Pharmacotherapy (Medications Ordered): No orders of the defined types were placed in this encounter.  Orders:  No orders of the defined types were placed in this encounter.  Follow-up plan:   No follow-ups on file.    Recent Visits Date Type Provider Dept  02/18/23 Office Visit Cephus Collin, MD [facility]  Showing recent visits within past 90 days and meeting all other requirements Future Appointments Date Type Provider Dept  05/13/23 Appointment Cephus Collin, MD [facility]  Showing future appointments within next 90 days and meeting all other requirements  I discussed the assessment and treatment plan with the patient. The patient was provided an opportunity to ask questions and all were answered. The patient agreed with the plan and demonstrated an understanding of the instructions.  Patient advised to call back or seek an in-person evaluation if the symptoms or  condition worsens.  Duration of encounter: *** minutes.  Total time on encounter, as per AMA guidelines included both the face-to-face and non-face-to-face time personally spent by the physician and/or other qualified health care professional(s) on the day of the encounter (includes time in activities that require the physician or other qualified health care professional and does not include time in activities normally performed by clinical staff). Physician's time may include the following activities when performed: Preparing to see the patient (e.g., pre-charting review of records, searching for previously ordered imaging, lab work, and nerve conduction tests) Review of prior analgesic pharmacotherapies. Reviewing PMP Interpreting ordered tests (e.g., lab work, imaging, nerve conduction tests) Performing post-procedure evaluations, including interpretation of diagnostic procedures Obtaining and/or reviewing separately obtained history Performing a medically appropriate examination and/or evaluation Counseling and educating the patient/family/caregiver Ordering medications, tests, or procedures Referring and communicating with other health care professionals (when not separately reported) Documenting clinical information in the electronic or other health record Independently interpreting results (not separately reported) and communicating results to the patient/ family/caregiver Care coordination (not separately reported)  Note by: Xela Oregel K Ailah Barna, NP (TTS and AI technology used. I apologize for any typographical errors that were not detected and corrected.) Date: 05/13/2023; Time: 1:10 PM

## 2023-05-13 ENCOUNTER — Ambulatory Visit
Payer: 59 | Attending: Student in an Organized Health Care Education/Training Program | Admitting: Student in an Organized Health Care Education/Training Program

## 2023-05-13 ENCOUNTER — Encounter: Payer: Self-pay | Admitting: Student in an Organized Health Care Education/Training Program

## 2023-05-13 VITALS — BP 150/95 | HR 82 | Temp 98.1°F | Resp 16 | Ht 66.0 in | Wt 241.0 lb

## 2023-05-13 DIAGNOSIS — Z79899 Other long term (current) drug therapy: Secondary | ICD-10-CM | POA: Insufficient documentation

## 2023-05-13 DIAGNOSIS — E1142 Type 2 diabetes mellitus with diabetic polyneuropathy: Secondary | ICD-10-CM | POA: Diagnosis not present

## 2023-05-13 DIAGNOSIS — M25552 Pain in left hip: Secondary | ICD-10-CM | POA: Diagnosis not present

## 2023-05-13 DIAGNOSIS — G894 Chronic pain syndrome: Secondary | ICD-10-CM | POA: Insufficient documentation

## 2023-05-13 DIAGNOSIS — M25551 Pain in right hip: Secondary | ICD-10-CM | POA: Insufficient documentation

## 2023-05-13 DIAGNOSIS — Z794 Long term (current) use of insulin: Secondary | ICD-10-CM | POA: Diagnosis not present

## 2023-05-13 DIAGNOSIS — G890 Central pain syndrome: Secondary | ICD-10-CM | POA: Diagnosis not present

## 2023-05-13 MED ORDER — BUPRENORPHINE 7.5 MCG/HR TD PTWK
1.0000 | MEDICATED_PATCH | TRANSDERMAL | 2 refills | Status: DC
Start: 2023-05-13 — End: 2023-07-30

## 2023-05-18 ENCOUNTER — Telehealth: Payer: Self-pay | Admitting: Family Medicine

## 2023-05-18 ENCOUNTER — Other Ambulatory Visit: Payer: Self-pay | Admitting: Family Medicine

## 2023-05-18 ENCOUNTER — Other Ambulatory Visit: Payer: Self-pay | Admitting: "Endocrinology

## 2023-05-18 DIAGNOSIS — E162 Hypoglycemia, unspecified: Secondary | ICD-10-CM

## 2023-05-18 DIAGNOSIS — E1165 Type 2 diabetes mellitus with hyperglycemia: Secondary | ICD-10-CM

## 2023-05-18 DIAGNOSIS — Z794 Long term (current) use of insulin: Secondary | ICD-10-CM

## 2023-05-18 NOTE — Telephone Encounter (Signed)
DULoxetine (CYMBALTA) 60 MG capsule  

## 2023-05-21 NOTE — Telephone Encounter (Signed)
 Called [facility] and spoke with [place]. Gave a phone in order for refilling the Epi Pens. The albuterol  order for 75 ml is equal to only a 13 day supply if pt is doing nebs twice a day not prn. Sending back to office to review.    Requested Prescriptions  Pending Prescriptions Disp Refills   albuterol  (ACCUNEB ) 1.25 MG/3ML nebulizer solution [Pharmacy Med Name: ALBUTEROL  NEB 1.25MG /3ML*25] 75 mL     Sig: USE 1 VIAL VIA NEBULIZER TWICE  DAILY     Pulmonology:  Beta Agonists 2 Failed - 05/21/2023 11:32 AM      Failed - Last BP in normal range    BP Readings from Last 1 Encounters:  05/13/23 (!) 150/95         Passed - Last Heart Rate in normal range    Pulse Readings from Last 1 Encounters:  05/13/23 82         Passed - Valid encounter within last 12 months    Recent Outpatient Visits           2 months ago AKI (acute kidney injury) [facility])   [facility] Adeline Hone, [name]   10 years ago HYPERTENSION   [facility] [place]. [facility] Rai, Ripudeep K, MD   10 years ago Depressive disorder, not elsewhere classified   [facility] Of [place]. [facility] Ghimire, Estil Heman, MD       Future Appointments             In 3 months Adeline Hone, [name] [facility], [facility]            Signed Prescriptions Disp Refills   EPINEPHrine  0.3 mg/0.3 mL IJ SOAJ injection 2 each 1    Sig: INJECT 1 PEN INTRAMUSCULARLY AS  NEEDED FOR ALLERGIC RESPONSE AS  DIRECTED BY MD. Salina Craver MEDICAL  HELP AFTER USE     Immunology: Antidotes Passed - 05/21/2023 11:32 AM      Passed - Valid encounter within last 12 months    Recent Outpatient Visits           2 months ago AKI (acute kidney injury) [facility])   [facility] Adeline Hone, [name]   10 years ago HYPERTENSION   [facility] Of [place]. [facility] Rai, Ripudeep  K, MD   10 years ago Depressive disorder, not elsewhere classified   [facility] - [facility] Dept Of [place]. [facility] Ghimire, Estil Heman, MD       Future Appointments             In 3 months Adeline Hone, [name] [facility], [facility]

## 2023-05-21 NOTE — Telephone Encounter (Signed)
 Requested Prescriptions  Pending Prescriptions Disp Refills   albuterol  (ACCUNEB ) 1.25 MG/3ML nebulizer solution [Pharmacy Med Name: ALBUTEROL  NEB 1.25MG /3ML*25] 75 mL     Sig: USE 1 VIAL VIA NEBULIZER TWICE  DAILY     Pulmonology:  Beta Agonists 2 Failed - 05/21/2023 11:29 AM      Failed - Last BP in normal range    BP Readings from Last 1 Encounters:  05/13/23 (!) 150/95         Passed - Last Heart Rate in normal range    Pulse Readings from Last 1 Encounters:  05/13/23 82         Passed - Valid encounter within last 12 months    Recent Outpatient Visits           2 months ago AKI (acute kidney injury) [facility])   [facility] Adeline Hone, [name]   10 years ago HYPERTENSION   [facility] [place] [facility] [place] [facility] Of [place]. [facility] Rai, Ripudeep K, MD   10 years ago Depressive disorder, not elsewhere classified   [facility] Of [place]. [facility] Ghimire, Estil Heman, MD       Future Appointments             In 3 months Adeline Hone, [name] [facility], [facility]             EPINEPHrine  0.3 mg/0.3 mL IJ SOAJ injection [employer] Med Name: EPINEPHrine  AUTO 0.3MG  (Epipen )] 2 each 1    Sig: INJECT 1 PEN INTRAMUSCULARLY AS  NEEDED FOR ALLERGIC RESPONSE AS  DIRECTED BY MD. Salina Craver MEDICAL  HELP AFTER USE     Immunology: Antidotes Passed - 05/21/2023 11:29 AM      Passed - Valid encounter within last 12 months    Recent Outpatient Visits           2 months ago AKI (acute kidney injury) [facility])   [place] [facility] Adeline Hone, [name]   10 years ago HYPERTENSION   [facility] [facility] [place] [facility] [place]. [facility] Rai, Ripudeep K, MD   10 years ago Depressive disorder, not elsewhere classified   [place] [facility] - [facility] Dept Of [place]. [facility] Ghimire, Estil Heman, MD       Future Appointments              In 3 months Adeline Hone, [name] [facility], [facility]

## 2023-05-24 ENCOUNTER — Other Ambulatory Visit: Payer: Self-pay | Admitting: Nurse Practitioner

## 2023-05-24 ENCOUNTER — Other Ambulatory Visit: Payer: Self-pay | Admitting: Student in an Organized Health Care Education/Training Program

## 2023-05-24 DIAGNOSIS — N3281 Overactive bladder: Secondary | ICD-10-CM

## 2023-05-24 DIAGNOSIS — E1142 Type 2 diabetes mellitus with diabetic polyneuropathy: Secondary | ICD-10-CM

## 2023-05-24 DIAGNOSIS — G894 Chronic pain syndrome: Secondary | ICD-10-CM

## 2023-05-24 DIAGNOSIS — G890 Central pain syndrome: Secondary | ICD-10-CM

## 2023-05-24 DIAGNOSIS — E1169 Type 2 diabetes mellitus with other specified complication: Secondary | ICD-10-CM

## 2023-05-26 NOTE — Telephone Encounter (Signed)
 Requested Prescriptions  Pending Prescriptions Disp Refills   ezetimibe  (ZETIA ) 10 MG tablet [Pharmacy Med Name: Ezetimibe  10 MG Oral Tablet] 100 tablet 2    Sig: TAKE 1 TABLET BY MOUTH DAILY     Cardiovascular:  Antilipid - Sterol Transport Inhibitors Failed - 05/26/2023  1:54 PM      Failed - AST in normal range and within 360 days    AST  Date Value Ref Range Status  02/16/2023 8 (L) 10 - 35 U/L Final         Failed - Lipid Panel in normal range within the last 12 months    Cholesterol, Total  Date Value Ref Range Status  05/08/2015 167 100 - 199 mg/dL Final   Cholesterol  Date Value Ref Range Status  02/16/2023 129 <200 mg/dL Final   LDL Cholesterol (Calc)  Date Value Ref Range Status  02/16/2023 60 mg/dL (calc) Final    Comment:    Reference range: <100 . Desirable range <100 mg/dL for primary prevention;   <70 mg/dL for patients with CHD or diabetic patients  with > or = 2 CHD risk factors. Aaron Aas LDL-C is now calculated using the Martin-Hopkins  calculation, which is a validated novel method providing  better accuracy than the Friedewald equation in the  estimation of LDL-C.  Melinda Sprawls et al. Erroll Heard. 1610;960(45): 2061-2068  (http://education.[employer].com/faq/FAQ164)    HDL  Date Value Ref Range Status  02/16/2023 45 (L) > OR = 50 mg/dL Final  40/98/1191 48 >47 mg/dL Final   Triglycerides  Date Value Ref Range Status  02/16/2023 166 (H) <150 mg/dL Final         Passed - ALT in normal range and within 360 days    ALT  Date Value Ref Range Status  02/16/2023 10 6 - 29 U/L Final         Passed - Patient is not pregnant      Passed - Valid encounter within last 12 months    Recent Outpatient Visits           2 months ago AKI (acute kidney injury) [facility])   [place] [facility] Adeline Hone, [name]   10 years ago HYPERTENSION   [place] [facility] Dept Of [place]. [facility] Rai, Ripudeep K, MD   10 years  ago Depressive disorder, not elsewhere classified   [place] [facility] - [facility] Dept Of [place]. [facility] Ghimire, Estil Heman, MD       Future Appointments             In 3 months Adeline Hone, [name] [place] [facility], [facility]             MYRBETRIQ  50 MG TB24 tablet [Pharmacy Med Name: Myrbetriq  50 MG Oral Tablet Extended Release 24 Hour] 100 tablet 2    Sig: TAKE 1 TABLET BY MOUTH DAILY     Urology: Bladder Agents - mirabegron  Failed - 05/26/2023  1:54 PM      Failed - Cr in normal range and within 360 days    Creat  Date Value Ref Range Status  03/15/2023 1.07 (H) 0.50 - 1.03 mg/dL Final   Creatinine,U  Date Value Ref Range Status  04/16/2022 125.4 mg/dL Final   Creatinine, Urine  Date Value Ref Range Status  11/17/2021 25 20 - 275 mg/dL Final         Failed - AST in normal range  and within 360 days    AST  Date Value Ref Range Status  02/16/2023 8 (L) 10 - 35 U/L Final         Failed - Last BP in normal range    BP Readings from Last 1 Encounters:  05/13/23 (!) 150/95         Passed - ALT in normal range and within 360 days    ALT  Date Value Ref Range Status  02/16/2023 10 6 - 29 U/L Final         Passed - eGFR is 15 or above and within 360 days    GFR, Est African American  Date Value Ref Range Status  11/28/2018 94 > OR = 60 mL/min/1.30m2 Final   GFR calc Af Amer  Date Value Ref Range Status  05/30/2019 >60 >60 mL/min Final   GFR, Est Non African American  Date Value Ref Range Status  11/28/2018 81 > OR = 60 mL/min/1.43m2 Final   GFR, Estimated  Date Value Ref Range Status  08/10/2022 >60 >60 mL/min Final    Comment:    (NOTE) Calculated using the CKD-EPI Creatinine Equation (2021) Performed at [facility], [address] [place], [place] [address]    GFR  Date Value Ref Range Status  04/14/2022 74.92 >60.00 mL/min Final    Comment:    Calculated using the CKD-EPI Creatinine Equation  (2021)   eGFR  Date Value Ref Range Status  03/15/2023 60 > OR = 60 mL/min/1.81m2 Final         Passed - Valid encounter within last 12 months    Recent Outpatient Visits           2 months ago AKI (acute kidney injury) [facility])   [place] [facility] Adeline Hone, [name]   10 years ago HYPERTENSION   [place] [facility] Dept Of [place]. [facility] Rai, Ripudeep K, MD   10 years ago Depressive disorder, not elsewhere classified   [place] [facility] - [facility] Dept Of [place]. [facility] Ghimire, Estil Heman, MD       Future Appointments             In 3 months Adeline Hone, [name] [facility], [facility]

## 2023-05-29 ENCOUNTER — Other Ambulatory Visit: Payer: Self-pay | Admitting: Family Medicine

## 2023-05-31 NOTE — Telephone Encounter (Signed)
 Requested Prescriptions  Pending Prescriptions Disp Refills   albuterol  (VENTOLIN  HFA) 108 (90 Base) MCG/ACT inhaler [Pharmacy Med Name: ALBUTEROL  HFA 90MCG/ACT (PA)] 17 g 2    Sig: USE 1 TO 2 INHALATIONS BY MOUTH  EVERY 6 HOURS AS NEEDED     Pulmonology:  Beta Agonists 2 Failed - 05/31/2023  4:10 PM      Failed - Last BP in normal range    BP Readings from Last 1 Encounters:  05/13/23 (!) 150/95         Passed - Last Heart Rate in normal range    Pulse Readings from Last 1 Encounters:  05/13/23 82         Passed - Valid encounter within last 12 months    Recent Outpatient Visits           2 months ago AKI (acute kidney injury) [facility])   [place] [facility] Adeline Hone, [name]   10 years ago HYPERTENSION   [facility] [place] [facility] Dept Of [place]. [facility] Rai, Ripudeep K, MD   10 years ago Depressive disorder, not elsewhere classified   [place] [facility] - A Dept Of [place]. [facility] Ghimire, Estil Heman, MD       Future Appointments             In 3 months Adeline Hone, [name] [facility], [facility]

## 2023-06-02 ENCOUNTER — Other Ambulatory Visit: Payer: Self-pay | Admitting: Pharmacist

## 2023-06-02 DIAGNOSIS — I10 Essential (primary) hypertension: Secondary | ICD-10-CM

## 2023-06-02 DIAGNOSIS — E1165 Type 2 diabetes mellitus with hyperglycemia: Secondary | ICD-10-CM

## 2023-06-02 NOTE — Patient Instructions (Signed)
 Goals Addressed             This Visit's Progress    Pharmacy Goals       The goal A1c is less than 7%. This is the best way to reduce the risk of the long term complications of diabetes, including heart disease, kidney disease, eye disease, strokes, and nerve damage. An A1c of less than 7% corresponds with fasting sugars less than 130 and 2 hour after meal sugars less than 180.   Check your blood pressure once daily, and any time you have concerning symptoms like headache, chest pain, dizziness, shortness of breath, or vision changes.   Our goal is less than 130/80.  To appropriately check your blood pressure, make sure you do the following:  1) Avoid caffeine, exercise, or tobacco products for 30 minutes before checking. Empty your bladder. 2) Sit with your back supported in a flat-backed chair. Rest your arm on something flat (arm of the chair, table, etc). 3) Sit still with your feet flat on the floor, resting, for at least 5 minutes.  4) Check your blood pressure. Take 1-2 readings.  5) Write down these readings and bring with you to any provider appointments.  Bring your home blood pressure machine with you to a provider's office for accuracy comparison at least once a year.   Make sure you take your blood pressure medications before you come to any office visit, even if you were asked to fast for labs.  Estelle Grumbles, PharmD, [facility] [phone]

## 2023-06-02 NOTE — Progress Notes (Signed)
 06/02/2023 Name: [name] MRN: [id number] DOB: May 22, 1965  No chief complaint on file.   [name] is a 58 y.o. year old female who presented for a telephone visit.   They were referred to the pharmacist by their PCP for assistance in managing complex medication management.      Subjective:   Care Team: Primary Care Provider: Adeline Hone, [name] ; Next Scheduled Visit: 09/13/2023 Cardiologist: Barbie Boon, MD Endocrinologist: [name], MD; Next Scheduled Visit: 06/22/2023 Pain Management: Cephus Collin, MD Nurse Care Manager: Sheria Dills, RN; Next Scheduled Visit: 06/07/2023 Neurologist: Dennard Fisher, PA  Pulmonologist: Horace Lye, MD   Medication Access/Adherence  Current Pharmacy:  [facility]) - [place] - [address] [facility] [address] [place] [address] [place] [address] Phone: [phone] Fax: [phone]  [facility] - [place] - [address] [place] [address] Phone: [phone] Fax: [phone]   Patient reports affordability concerns with their medications: No  Patient reports access/transportation concerns to their pharmacy: No  Patient reports adherence concerns with their medications:  No    Denies using weekly pillbox; takes medications directly from pill bottles and keeps record using a chart   Diabetes:   Patient followed by [facility]   Current medications:  - Toujeo  40 units at bedtime - Humalog  9 units with breakfast, 13 units with lunch and 9 units before supper (13 units if BG>200, otherwise it is 9 units)  - Trulicity  4.5 mg weekly (increased to current dose ~1 month ago Reports tolerating well   Reports uses Freestyle Libre 3 Plus Continuous Glucose Monitoring, but is unable to connect via LibreView today   Date of Download: 06/02/23 % Time CGM is active: 96% Average  Glucose: 279 mg/dL Glucose Management Indicator: 10%  Glucose Variability: 19.8% (goal <36%) Time in Goal:  - Time in range 70-180: 5% - Time above range: 95% - Time below range: 0%  Current dietary patterns - Breakfast: honey nut cheerios or oatmeal or eggs with toast - Lunch: hamburger - Supper: baked chicken with cream of chicken soup and rice - Snacks: Granola bars and Special K bars - Drinks: water  and V8 splash juice or crann-grape juice    Patient denies hypoglycemic s/sx including dizziness, shakiness, sweating.      Hypertension:   Patient followed by [facility]   Current medications:  - amlodipine  10 mg daily - carvedilol  25 mg twice daily - furosemide  20 mg daily - hydralazine  50 mg three times daily   Previous therapies tried: olmesartan -HCTZ, olmesartan , spironolactone  25 mg daily - reports has not taken these medications in > 1 year   Patient has a validated, automated, upper arm home BP cuff Current blood pressure readings readings: recalls today reading: 150/70 (prior to taking her medications), but recalls recent readings have been "good"   Patient denies hypotensive s/sx including dizziness, lightheadedness.    Current physical activity: limited to walking around the house during the day       COPD/Asthma:   Patient followed by [facility]mon[facility]   Current medications: - Wixela 250-50 mcg - 1 puff twice daily.  - albuterol  nebulizer solution - 1 vial via nebulizer twice daily as needed - albuterol  HFA inhaler - 1-2 puffs every 6 hours as needed     Confirms using CPAP for all sleep   Objective:  Lab Results  Component  Value Date   HGBA1C 11.0 (A) 02/04/2023    Lab Results  Component Value Date   CREATININE 1.07 (H) 03/15/2023   BUN 14 03/15/2023   NA 140 03/15/2023   K 4.0 03/15/2023   CL 96 (L) 03/15/2023   CO2 33 (H) 03/15/2023    Lab Results  Component Value Date   CHOL 129 02/16/2023   HDL 45 (L)  02/16/2023   LDLCALC 60 02/16/2023   TRIG 166 (H) 02/16/2023   CHOLHDL 2.9 02/16/2023    Current Outpatient Medications on File Prior to Visit  Medication Sig Dispense Refill   amLODipine  (NORVASC ) 10 MG tablet TAKE 1 TABLET BY MOUTH DAILY 90 tablet 1   carvedilol  (COREG ) 25 MG tablet Take 25 mg by mouth 2 (two) times daily.     Dulaglutide  (TRULICITY ) 4.5 MG/0.5ML SOAJ Inject 4.5 mg as directed once a week. 6 mL 3   furosemide  (LASIX ) 20 MG tablet TAKE 1 TABLET BY MOUTH DAILY 90 tablet 1   hydrALAZINE  (APRESOLINE ) 50 MG tablet Take 1 tablet by mouth 3 (three) times daily.     ACCU-CHEK GUIDE TEST test strip USE 1 STRIP IN THE MORNING, AT  NOON, AND AT BEDTIME 100 strip 9   albuterol  (ACCUNEB ) 1.25 MG/3ML nebulizer solution USE 1 VIAL VIA NEBULIZER TWICE  DAILY 75 mL 0   albuterol  (VENTOLIN  HFA) 108 (90 Base) MCG/ACT inhaler USE 1 TO 2 INHALATIONS BY MOUTH  EVERY 6 HOURS AS NEEDED 17 g 2   amitriptyline  (ELAVIL ) 25 MG tablet Take 2-3 tablets (50-75 mg total) by mouth at bedtime. 90 tablet 5   buprenorphine  (BUTRANS ) 7.5 MCG/HR Place 1 patch onto the skin once a week. 4 patch 2   butalbital -acetaminophen -caffeine  (FIORICET ) 50-325-40 MG tablet Take 1-2 tablets by mouth every 6 (six) hours as needed for headache. 10 tablet 0   cholecalciferol  (VITAMIN D3) 25 MCG (1000 UNIT) tablet Take 1 tablet (1,000 Units total) by mouth daily. 90 tablet 1   clopidogrel  (PLAVIX ) 75 MG tablet Take 1 tablet (75 mg total) by mouth daily. 90 tablet 0   Continuous Glucose Sensor (FREESTYLE LIBRE 3 PLUS SENSOR) MISC Inject 1 Device into the skin continuous. Change every 14 days 6 each 3   Continuous Glucose Sensor (FREESTYLE LIBRE 3 SENSOR) MISC 1 Device by Does not apply route continuous. 6 each 3   cycloSPORINE  (RESTASIS ) 0.05 % ophthalmic emulsion Place 2 drops into both eyes 2 (two) times daily.     DULoxetine  (CYMBALTA ) 60 MG capsule TAKE 1 CAPSULE BY MOUTH EVERY DAY 90 capsule 0   EPINEPHrine  0.3 mg/0.3 mL  IJ SOAJ injection INJECT 1 PEN INTRAMUSCULARLY AS  NEEDED FOR ALLERGIC RESPONSE AS  DIRECTED BY MD. SEEK MEDICAL  HELP AFTER USE 2 each 1   ezetimibe  (ZETIA ) 10 MG tablet TAKE 1 TABLET BY MOUTH DAILY 100 tablet 1   gabapentin  (NEURONTIN ) 600 MG tablet 600 mg qAM, 600 mg qPM, 1200 mg QHS 120 tablet 5   Glucagon  (GVOKE HYPOPEN  2-PACK) 1 MG/0.2ML SOAJ INJECT SUB-Q 1MG  ONCE AS NEEDED  FOR UP TO 1 DOSE. ONLY FOR  SEVERE LOW BLOOD SUGAR NOT  RESPONDING TO DRINKING  JUICE/GLUCOSE TABS, WHEN CBG &lt;70 1.2 mL 1   insulin  glargine, 2 Unit Dial , (TOUJEO  MAX SOLOSTAR) 300 UNIT/ML Solostar Pen Inject 33 Units into the skin at bedtime. 12 mL 3   insulin  lispro (HUMALOG  KWIKPEN) 100 UNIT/ML KwikPen 9u in the a.m.  13u lunchtime 9 u at bedtime.  Sliding scale  30 mL 2   Insulin  Pen Needle (PEN NEEDLES) 32G X 4 MM MISC 1 Device by Does not apply route in the morning, at noon, in the evening, and at bedtime. 200 each 2   levETIRAcetam  (KEPPRA ) 500 MG tablet Take 500-750 mg by mouth See admin instructions. Take 1 tablet (500mg ) by mouth every morning and take 1 tablets by mouth (750mg ) by mouth every night     meclizine  (ANTIVERT ) 25 MG tablet Take 1 tablet (25 mg total) by mouth 3 (three) times daily as needed for dizziness. 30 tablet 0   mirabegron  ER (MYRBETRIQ ) 50 MG TB24 tablet TAKE 1 TABLET BY MOUTH DAILY 100 tablet 1   pantoprazole  (PROTONIX ) 40 MG tablet Take 1 tablet (40 mg total) by mouth daily as needed. 100 tablet 1   polyethylene glycol (MIRALAX  / GLYCOLAX ) 17 g packet Take 17 g by mouth daily as needed for mild constipation. 100 each 3   rosuvastatin  (CRESTOR ) 40 MG tablet TAKE 1 TABLET BY MOUTH DAILY 100 tablet 2   senna-docusate (SENOKOT-S) 8.6-50 MG tablet Take 1 tablet by mouth at bedtime as needed for mild constipation. 60 tablet 0   tiZANidine  (ZANAFLEX ) 4 MG tablet Take 1 tablet (4 mg total) by mouth every 8 (eight) hours as needed for muscle spasms. 90 tablet 2   Ubrogepant  (UBRELVY ) 50 MG TABS 50  mg or 100 mg taken orally with or without food. If needed, a second dose may be taken at least 2 hours after the initial dose. The maximum dose in a 24-hour period is 200 mg 60 tablet 2   WIXELA INHUB 250-50 MCG/DOSE AEPB Inhale 1 puff into the lungs 2 (two) times daily.     No current facility-administered medications on file prior to visit.        Assessment/Plan:   Counsel on importance of medication adherence, particularly for her insulin  regimen   Diabetes: - Currently uncontrolled - Reviewed long term cardiovascular and renal outcomes of uncontrolled blood sugar - Reviewed goal A1c, goal fasting, and goal 2 hour post prandial glucose - Reviewed dietary modifications including importance of having regular well-balanced meals and snacks throughout the day, while controlling carbohydrate portion sizes  Recommend to avoid intake of sugary beverages - Counsel on signs of low blood sugar and how to manage hypoglycemia             Encouraged patient to pick up and carry glucose tablets with her in case needed  - Recommend to continue to use Freestyle Libre 3 CGM to monitor blood sugar/as feedback on dietary choices Patient to check glucose with fingerstick check when needed for symptoms and as back up to CGM. Patient to contact office if needed for readings outside of established parameters or symptoms     Hypertension: - Reviewed long term cardiovascular and renal outcomes of uncontrolled blood pressure - Reviewed appropriate blood pressure monitoring technique and reviewed goal blood pressure.  - Recommend to monitor home blood pressure, keep log of results and have this record to review at upcoming medical appointments. Patient to contact provider office sooner if needed for readings outside of established parameters or symptoms     Asthma/COPD: - Have reviewed appropriate inhaler technique.     Follow Up Plan:   Patient denies further medication questions or concerns  today Provide patient with contact information for clinic pharmacist to contact if needed in future for medication questions/concerns    Arthur Lash, PharmD, [facility] [phone]

## 2023-06-03 ENCOUNTER — Encounter: Payer: Self-pay | Admitting: Family Medicine

## 2023-06-03 ENCOUNTER — Ambulatory Visit: Admitting: "Endocrinology

## 2023-06-07 ENCOUNTER — Other Ambulatory Visit: Payer: Self-pay

## 2023-06-10 ENCOUNTER — Encounter: Payer: Self-pay | Admitting: "Endocrinology

## 2023-06-10 ENCOUNTER — Emergency Department
Admission: EM | Admit: 2023-06-10 | Discharge: 2023-06-10 | Disposition: A | Discharge status: Home / Self Care | Attending: Emergency Medicine | Admitting: Emergency Medicine

## 2023-06-10 ENCOUNTER — Other Ambulatory Visit: Payer: Self-pay

## 2023-06-10 ENCOUNTER — Telehealth: Payer: Self-pay

## 2023-06-10 ENCOUNTER — Encounter: Payer: Self-pay | Admitting: Emergency Medicine

## 2023-06-10 DIAGNOSIS — Z794 Long term (current) use of insulin: Secondary | ICD-10-CM | POA: Diagnosis not present

## 2023-06-10 DIAGNOSIS — I509 Heart failure, unspecified: Secondary | ICD-10-CM | POA: Diagnosis not present

## 2023-06-10 DIAGNOSIS — E1165 Type 2 diabetes mellitus with hyperglycemia: Secondary | ICD-10-CM | POA: Insufficient documentation

## 2023-06-10 DIAGNOSIS — I110 Hypertensive heart disease with heart failure: Secondary | ICD-10-CM | POA: Diagnosis not present

## 2023-06-10 DIAGNOSIS — I2510 Atherosclerotic heart disease of native coronary artery without angina pectoris: Secondary | ICD-10-CM | POA: Insufficient documentation

## 2023-06-10 DIAGNOSIS — R739 Hyperglycemia, unspecified: Secondary | ICD-10-CM

## 2023-06-10 DIAGNOSIS — N3000 Acute cystitis without hematuria: Secondary | ICD-10-CM | POA: Diagnosis not present

## 2023-06-10 LAB — CBC
HCT: 42.4 % (ref 36.0–46.0)
Hemoglobin: 13.3 g/dL (ref 12.0–15.0)
MCH: 28.7 pg (ref 26.0–34.0)
MCHC: 31.4 g/dL (ref 30.0–36.0)
MCV: 91.4 fL (ref 80.0–100.0)
Platelets: 295 10*3/uL (ref 150–400)
RBC: 4.64 MIL/uL (ref 3.87–5.11)
RDW: 13.1 % (ref 11.5–15.5)
WBC: 7.5 10*3/uL (ref 4.0–10.5)
nRBC: 0 % (ref 0.0–0.2)

## 2023-06-10 LAB — URINALYSIS, ROUTINE W REFLEX MICROSCOPIC
Bilirubin Urine: NEGATIVE
Glucose, UA: >=500 mg/dL — AB
Hgb urine dipstick: NEGATIVE
Ketones, ur: NEGATIVE mg/dL
Nitrite: NEGATIVE
Protein, ur: NEGATIVE mg/dL
Specific Gravity, Urine: 1.03 (ref 1.005–1.030)
WBC, UA: >50 WBC/hpf (ref 0–5)
pH: 6 (ref 5.0–8.0)

## 2023-06-10 LAB — BASIC METABOLIC PANEL WITH GFR
Anion gap: 11 (ref 5–15)
BUN: 15 mg/dL (ref 6–20)
CO2: 26 mmol/L (ref 22–32)
Calcium: 9.2 mg/dL (ref 8.9–10.3)
Chloride: 97 mmol/L — ABNORMAL LOW (ref 98–111)
Creatinine, Ser: 0.96 mg/dL (ref 0.44–1.00)
GFR, Estimated: >60 mL/min (ref >60)
Glucose, Bld: 438 mg/dL — ABNORMAL HIGH (ref 70–99)
Potassium: 3.9 mmol/L (ref 3.5–5.1)
Sodium: 134 mmol/L — ABNORMAL LOW (ref 135–145)

## 2023-06-10 LAB — CBG MONITORING, ED
Glucose-Capillary: 390 mg/dL — ABNORMAL HIGH (ref 70–99)
Glucose-Capillary: 322 mg/dL — ABNORMAL HIGH (ref 70–99)

## 2023-06-10 MED ORDER — INSULIN ASPART 100 UNIT/ML IJ SOLN
8.0000 [IU] | Freq: Once | INTRAMUSCULAR | Status: AC
  Administered 2023-06-10: 8 [IU] via INTRAVENOUS
  Filled 2023-06-10: qty 1

## 2023-06-10 MED ORDER — SODIUM CHLORIDE 0.9 % IV SOLN
1.0000 g | Freq: Once | INTRAVENOUS | Status: AC
  Administered 2023-06-10: 1 g via INTRAVENOUS
  Filled 2023-06-10: qty 10

## 2023-06-10 MED ORDER — SODIUM CHLORIDE 0.9 % IV BOLUS
1000.0000 mL | Freq: Once | INTRAVENOUS | Status: AC
  Administered 2023-06-10: 1000 mL via INTRAVENOUS

## 2023-06-10 MED ORDER — CEPHALEXIN 500 MG PO CAPS: 500.0000 mg | ORAL_CAPSULE | Freq: Four times a day (QID) | ORAL | 0 refills | Status: AC

## 2023-06-10 NOTE — ED Triage Notes (Signed)
 Pt via POV from home. Pt c/o hyperglycemia, report her Jerrilyn Moras was reading high last night, pt has been able to get her sugar down to the 300s. CBG 390 on arrival. Pt c/o lightheadedness. Does take insulin . Pt is A&Ox4 and NAD, ambulatory with steady gait to triage.

## 2023-06-10 NOTE — ED Notes (Signed)
 Pt states she has been checking her sugar since last night and her sugar on her [name] has been reading high. Pts states she has been taking her medications as prescribed and has not missed any doses. Denies being sick or around anyone who has been sick.

## 2023-06-10 NOTE — ED Provider Notes (Signed)
 [facility] Provider Note    Event Date/Time   First MD Initiated Contact with Patient 06/10/23 2007     (approximate)   History   Chief Complaint Hyperglycemia   HPI  [name] is a 58 y.o. female with past medical history of hypertension, diabetes, CAD, CHF, seizures, and chronic pain syndrome who presents to the ED complaining of hyperglycemia.  Patient reports that her blood sugar has been running in the 400s and 500s over the past 2 days despite taking her insulin  as prescribed.  During this time, she has been feeling lightheaded and generally malaised, also endorses some dysuria.  She denies any fevers, abdominal pain, nausea, vomiting, diarrhea, or flank pain.  She has not had any cough, chest pain, or shortness of breath.     Physical Exam   Triage Vital Signs: ED Triage Vitals  Encounter Vitals Group     BP 06/10/23 1749 (!) 154/98     Systolic BP Percentile --      Diastolic BP Percentile --      Pulse Rate 06/10/23 1743 88     Resp 06/10/23 1743 18     Temp 06/10/23 1743 98.8 F (37.1 C)     Temp Source 06/10/23 1743 Oral     SpO2 06/10/23 1743 100 %     Weight 06/10/23 1743 240 lb (108.9 kg)     Height 06/10/23 1743 5\' 6"  (1.676 m)     Head Circumference --      Peak Flow --      Pain Score --      Pain Loc --      Pain Education --      Exclude from Growth Chart --     Most recent vital signs: Vitals:   06/10/23 1749 06/10/23 2004  BP: (!) 154/98 (!) 142/80  Pulse:  88  Resp:  17  Temp:  98.3 F (36.8 C)  SpO2:  98%    Constitutional: Alert and oriented. Eyes: Conjunctivae are normal. Head: Atraumatic. Nose: No congestion/rhinnorhea. Mouth/Throat: Mucous membranes are moist.  Cardiovascular: Normal rate, regular rhythm. Grossly normal heart sounds.  2+ radial pulses bilaterally. Respiratory: Normal respiratory effort.  No retractions. Lungs CTAB. Gastrointestinal: Soft and nontender. No  distention. Musculoskeletal: No lower extremity tenderness nor edema.  Neurologic:  Normal speech and language. No gross focal neurologic deficits are appreciated.    ED Results / Procedures / Treatments   Labs (all labs ordered are listed, but only abnormal results are displayed) Labs Reviewed  BASIC METABOLIC PANEL WITH GFR - Abnormal; Notable for the following components:      Result Value   Sodium 134 (*)    Chloride 97 (*)    Glucose, Bld 438 (*)    All other components within normal limits  URINALYSIS, ROUTINE W REFLEX MICROSCOPIC - Abnormal; Notable for the following components:   Color, Urine YELLOW (*)    APPearance CLOUDY (*)    Glucose, UA >=500 (*)    Leukocytes,Ua LARGE (*)    Bacteria, UA RARE (*)    All other components within normal limits  CBG MONITORING, ED - Abnormal; Notable for the following components:   Glucose-Capillary 390 (*)    All other components within normal limits  CBG MONITORING, ED - Abnormal; Notable for the following components:   Glucose-Capillary 322 (*)    All other components within normal limits  URINE CULTURE  CBC  CBG MONITORING, ED  PROCEDURES:  Critical Care performed: No  Procedures   MEDICATIONS ORDERED IN ED: Medications  sodium chloride  0.9 % bolus 1,000 mL (1,000 mLs Intravenous New Bag/Given 06/10/23 2152)  insulin  aspart (novoLOG ) injection 8 Units (8 Units Intravenous Given 06/10/23 2156)  cefTRIAXone  (ROCEPHIN ) 1 g in sodium chloride  0.9 % 100 mL IVPB (1 g Intravenous New Bag/Given 06/10/23 2218)     IMPRESSION / MDM / ASSESSMENT AND PLAN / ED COURSE  I reviewed the triage vital signs and the nursing notes.                              58 y.o. female with past medical history of hypertension, diabetes, CAD, CHF, seizures, and chronic pain syndrome who presents to the ED complaining of elevated blood sugar with lightheadedness and malaise for the past 2 days.  Patient's presentation is most consistent with  acute presentation with potential threat to life or bodily function.  Differential diagnosis includes, but is not limited to, hypoglycemia, dehydration, electrolyte abnormality, AKI, DKA, HHS, sepsis, UTI.  Patient nontoxic-appearing and in no acute distress, vital signs are unremarkable.  She appears well and no findings concerning for sepsis, however she does report dysuria and urinalysis is concerning for UTI.  We will send for culture and treat with dose of IV Rocephin .  Labs show hyperglycemia with no evidence of DKA.  No significant anemia, leukocytosis, electrolyte abnormality, or AKI noted.  We will give IV fluid bolus and IV insulin , recheck CBG.  CBG improving on recheck, patient feeling better following IV fluids.  She is appropriate for discharge home with outpatient follow-up, will be prescribed course of antibiotics for UTI.  She was counseled to return to the ED for new or worsening symptoms, patient agrees with plan.      FINAL CLINICAL IMPRESSION(S) / ED DIAGNOSES   Final diagnoses:  Hyperglycemia  Acute cystitis without hematuria     Rx / DC Orders   ED Discharge Orders          Ordered    cephALEXin  (KEFLEX ) 500 MG capsule  4 times daily        06/10/23 2300             Note:  This document was prepared using Dragon voice recognition software and may include unintentional dictation errors.   Twilla Galea, MD 06/10/23 2302

## 2023-06-10 NOTE — Telephone Encounter (Signed)
 Pt sent a my chart message regarding elevated blood sugar over 500. I called pt to get more information from pt. Asked pt if she had been taking her medication as prescribed  by Dr Vertell Gory pt said yes. I called Dr Vertell Gory regarding pt elevated blood sugar Dr Vertell Gory recommendation was for  pt to Go to Urgent or ER, and to schedule pt to see her on 06/15/23 for an appt in office. Called pt back and informed her that Dr Vertell Gory wanted her to go to urgent care or ER. Pt understood and say that she would.

## 2023-06-11 ENCOUNTER — Telehealth: Payer: Self-pay

## 2023-06-11 LAB — URINE CULTURE: Culture: <10000 — AB

## 2023-06-11 NOTE — Telephone Encounter (Signed)
 Called pt to follow up with pt from urgent my chart message 06/10/23. Called pt 2 times no answer.

## 2023-06-12 ENCOUNTER — Emergency Department

## 2023-06-12 ENCOUNTER — Other Ambulatory Visit: Payer: Self-pay

## 2023-06-12 ENCOUNTER — Emergency Department
Admission: EM | Admit: 2023-06-12 | Discharge: 2023-06-12 | Disposition: A | Discharge status: Home / Self Care | Attending: Emergency Medicine | Admitting: Emergency Medicine

## 2023-06-12 DIAGNOSIS — I2510 Atherosclerotic heart disease of native coronary artery without angina pectoris: Secondary | ICD-10-CM | POA: Insufficient documentation

## 2023-06-12 DIAGNOSIS — G8929 Other chronic pain: Secondary | ICD-10-CM | POA: Insufficient documentation

## 2023-06-12 DIAGNOSIS — J449 Chronic obstructive pulmonary disease, unspecified: Secondary | ICD-10-CM | POA: Insufficient documentation

## 2023-06-12 DIAGNOSIS — I10 Essential (primary) hypertension: Secondary | ICD-10-CM

## 2023-06-12 DIAGNOSIS — R739 Hyperglycemia, unspecified: Secondary | ICD-10-CM

## 2023-06-12 DIAGNOSIS — R059 Cough, unspecified: Secondary | ICD-10-CM | POA: Diagnosis not present

## 2023-06-12 DIAGNOSIS — E1165 Type 2 diabetes mellitus with hyperglycemia: Secondary | ICD-10-CM | POA: Insufficient documentation

## 2023-06-12 DIAGNOSIS — R0602 Shortness of breath: Secondary | ICD-10-CM | POA: Insufficient documentation

## 2023-06-12 LAB — CBC WITH DIFFERENTIAL/PLATELET
Abs Immature Granulocytes: 0.02 10*3/uL (ref 0.00–0.07)
Basophils Absolute: 0 10*3/uL (ref 0.0–0.1)
Basophils Relative: 0 %
Eosinophils Absolute: 0 10*3/uL (ref 0.0–0.5)
Eosinophils Relative: 0 %
HCT: 44.6 % (ref 36.0–46.0)
Hemoglobin: 14.5 g/dL (ref 12.0–15.0)
Immature Granulocytes: 0 %
Lymphocytes Relative: 24 %
Lymphs Abs: 1.8 10*3/uL (ref 0.7–4.0)
MCH: 29.1 pg (ref 26.0–34.0)
MCHC: 32.5 g/dL (ref 30.0–36.0)
MCV: 89.6 fL (ref 80.0–100.0)
Monocytes Absolute: 0.5 10*3/uL (ref 0.1–1.0)
Monocytes Relative: 7 %
Neutro Abs: 5.1 10*3/uL (ref 1.7–7.7)
Neutrophils Relative %: 69 %
Platelets: 288 10*3/uL (ref 150–400)
RBC: 4.98 MIL/uL (ref 3.87–5.11)
RDW: 13.1 % (ref 11.5–15.5)
WBC: 7.4 10*3/uL (ref 4.0–10.5)
nRBC: 0 % (ref 0.0–0.2)

## 2023-06-12 LAB — URINALYSIS, ROUTINE W REFLEX MICROSCOPIC
Bilirubin Urine: NEGATIVE
Glucose, UA: >=500 mg/dL — AB
Hgb urine dipstick: NEGATIVE
Ketones, ur: 5 mg/dL — AB
Nitrite: NEGATIVE
Protein, ur: NEGATIVE mg/dL
Specific Gravity, Urine: 1.017 (ref 1.005–1.030)
pH: 7 (ref 5.0–8.0)

## 2023-06-12 LAB — RESP PANEL BY RT-PCR (RSV, FLU A&B, COVID)  RVPGX2
Influenza A by PCR: NEGATIVE
Influenza B by PCR: NEGATIVE
Resp Syncytial Virus by PCR: NEGATIVE
SARS Coronavirus 2 by RT PCR: NEGATIVE

## 2023-06-12 LAB — COMPREHENSIVE METABOLIC PANEL WITH GFR
ALT: 16 U/L (ref 0–44)
AST: 15 U/L (ref 15–41)
Albumin: 3.9 g/dL (ref 3.5–5.0)
Alkaline Phosphatase: 95 U/L (ref 38–126)
Anion gap: 12 (ref 5–15)
BUN: 11 mg/dL (ref 6–20)
CO2: 25 mmol/L (ref 22–32)
Calcium: 9.2 mg/dL (ref 8.9–10.3)
Chloride: 98 mmol/L (ref 98–111)
Creatinine, Ser: 0.83 mg/dL (ref 0.44–1.00)
GFR, Estimated: >60 mL/min (ref >60)
Glucose, Bld: 306 mg/dL — ABNORMAL HIGH (ref 70–99)
Potassium: 3.7 mmol/L (ref 3.5–5.1)
Sodium: 135 mmol/L (ref 135–145)
Total Bilirubin: 0.5 mg/dL (ref 0.0–1.2)
Total Protein: 7.6 g/dL (ref 6.5–8.1)

## 2023-06-12 LAB — BETA-HYDROXYBUTYRIC ACID: Beta-Hydroxybutyric Acid: 0.33 mmol/L — ABNORMAL HIGH (ref 0.05–0.27)

## 2023-06-12 LAB — CBG MONITORING, ED
Glucose-Capillary: 331 mg/dL — ABNORMAL HIGH (ref 70–99)
Glucose-Capillary: 260 mg/dL — ABNORMAL HIGH (ref 70–99)

## 2023-06-12 LAB — TROPONIN I (HIGH SENSITIVITY)
Troponin I (High Sensitivity): 9 ng/L (ref <18)
Troponin I (High Sensitivity): 8 ng/L (ref <18)

## 2023-06-12 LAB — LIPASE, BLOOD: Lipase: 45 U/L (ref 11–51)

## 2023-06-12 MED ORDER — NITROGLYCERIN 0.4 MG SL SUBL
0.4000 mg | SUBLINGUAL_TABLET | Freq: Once | SUBLINGUAL | Status: AC
  Administered 2023-06-12: 0.4 mg via SUBLINGUAL
  Filled 2023-06-12: qty 1

## 2023-06-12 MED ORDER — LABETALOL HCL 5 MG/ML IV SOLN
5.0000 mg | Freq: Once | INTRAVENOUS | Status: AC
  Administered 2023-06-12: 5 mg via INTRAVENOUS
  Filled 2023-06-12: qty 4

## 2023-06-12 MED ORDER — ONDANSETRON HCL 4 MG/2ML IJ SOLN
4.0000 mg | Freq: Once | INTRAMUSCULAR | Status: AC
  Administered 2023-06-12: 4 mg via INTRAVENOUS
  Filled 2023-06-12: qty 2

## 2023-06-12 MED ORDER — SODIUM CHLORIDE 0.9 % IV BOLUS
1000.0000 mL | Freq: Once | INTRAVENOUS | Status: AC
  Administered 2023-06-12: 1000 mL via INTRAVENOUS

## 2023-06-12 NOTE — ED Provider Notes (Signed)
 [facility] Provider Note    Event Date/Time   First MD Initiated Contact with Patient 06/12/23 1002     (approximate)   History   No chief complaint on file.   HPI [name] is a 58 y.o. female with history of HTN, DM2, CAD, chronic pain presenting today for hyperglycemia.  Patient was in the ED 2 days ago for similar symptoms after blood sugars at home were in the 400-500s.  She has been taking all of her medications as prescribed.  She states feeling fatigued, casually lightheaded, nauseous.  Had 1 episode of chest pain yesterday but is not present at this time.  Intermittent shortness of breath but has this at baseline from her COPD.  2 days ago she was diagnosed with a UTI and has been taking that medication as prescribed.  Blood sugar and blood pressure were high again today so she came back in for reevaluation.  Chart review: Reviewed notes from ED visit 2 days ago and patient has follow-up appointment with her endocrinology team in 3 days.     Physical Exam   Triage Vital Signs: ED Triage Vitals  Encounter Vitals Group     BP 06/12/23 1000 (!) 217/116     Systolic BP Percentile --      Diastolic BP Percentile --      Pulse Rate 06/12/23 1000 96     Resp 06/12/23 0959 16     Temp 06/12/23 0959 98.6 F (37 C)     Temp Source 06/12/23 0959 Oral     SpO2 06/12/23 1000 100 %     Weight 06/12/23 1000 240 lb 4.8 oz (109 kg)     Height --      Head Circumference --      Peak Flow --      Pain Score 06/12/23 1000 9     Pain Loc --      Pain Education --      Exclude from Growth Chart --     Most recent vital signs: Vitals:   06/12/23 1100 06/12/23 1200  BP: (!) 197/94 (!) 176/97  Pulse: 85 85  Resp: 14 20  Temp:    SpO2: 100% 100%   Physical Exam: I have reviewed the vital signs and nursing notes. General: Awake, alert, no acute distress.  Nontoxic appearing. Head:  Atraumatic, normocephalic.   ENT:  EOM intact, PERRL. Oral  mucosa is pink and moist with no lesions. Neck: Neck is supple with full range of motion, No meningeal signs. Cardiovascular:  RRR, No murmurs. Peripheral pulses palpable and equal bilaterally. Respiratory:  Symmetrical chest wall expansion.  No rhonchi, rales, or wheezes.  Good air movement throughout.  No use of accessory muscles.   Musculoskeletal:  No cyanosis or edema. Moving extremities with full ROM Abdomen:  Soft, nontender, nondistended. Neuro:  GCS 15, moving all four extremities, interacting appropriately. Speech clear. Psych:  Calm, appropriate.   Skin:  Warm, dry, no rash.    ED Results / Procedures / Treatments   Labs (all labs ordered are listed, but only abnormal results are displayed) Labs Reviewed  COMPREHENSIVE METABOLIC PANEL WITH GFR - Abnormal; Notable for the following components:      Result Value   Glucose, Bld 306 (*)    All other components within normal limits  URINALYSIS, ROUTINE W REFLEX MICROSCOPIC - Abnormal; Notable for the following components:   Color, Urine YELLOW (*)    APPearance HAZY (*)  Glucose, UA >=500 (*)    Ketones, ur 5 (*)    Leukocytes,Ua SMALL (*)    Bacteria, UA RARE (*)    All other components within normal limits  BETA-HYDROXYBUTYRIC ACID - Abnormal; Notable for the following components:   Beta-Hydroxybutyric Acid 0.33 (*)    All other components within normal limits  CBG MONITORING, ED - Abnormal; Notable for the following components:   Glucose-Capillary 331 (*)    All other components within normal limits  CBG MONITORING, ED - Abnormal; Notable for the following components:   Glucose-Capillary 260 (*)    All other components within normal limits  RESP PANEL BY RT-PCR (RSV, FLU A&B, COVID)  RVPGX2  CBC WITH DIFFERENTIAL/PLATELET  LIPASE, BLOOD  TROPONIN I (HIGH SENSITIVITY)  TROPONIN I (HIGH SENSITIVITY)     EKG My EKG interpretation: Rate of 87, normal sinus rhythm, normal axis, normal intervals.  No acute ST  elevations or depressions   RADIOLOGY Independently interpreted chest x-ray with no acute pathology.  Subtle finding was something that was on the outside of her chest   PROCEDURES:  Critical Care performed: No  Procedures   MEDICATIONS ORDERED IN ED: Medications  sodium chloride  0.9 % bolus 1,000 mL (0 mLs Intravenous Stopping previously hung infusion 06/12/23 1209)  ondansetron  (ZOFRAN ) injection 4 mg (4 mg Intravenous Given 06/12/23 1030)  nitroGLYCERIN  (NITROSTAT ) SL tablet 0.4 mg (0.4 mg Sublingual Given 06/12/23 1030)  labetalol  (NORMODYNE ) injection 5 mg (5 mg Intravenous Given 06/12/23 1206)     IMPRESSION / MDM / ASSESSMENT AND PLAN / ED COURSE  I reviewed the triage vital signs and the nursing notes.                              Differential diagnosis includes, but is not limited to, symptomatic hypoglycemia, dehydration, viral illness, UTI, pneumonia, lower suspicion for DKA or ACS  Patient's presentation is most consistent with acute complicated illness / injury requiring diagnostic workup.  Patient is a 58 year old female with history of poorly controlled diabetes presenting today for elevated blood glucose and blood pressure at home.  Vital signs on arrival do show initially elevated blood pressure at 217/116.  However, reevaluation without intervention it had dropped to 180 systolic.  Otherwise no tachycardia, tachypnea, or hypoxia.  Rest of her exam is largely unremarkable.  Initial blood glucose of 331.  Will give 1 L NS, Zofran .  Will also give nitro for 1 episode of chest pain yesterday and elevated blood pressure here today.  Chest x-ray unremarkable.  There was questionable finding but this appears to be external based on reassessment.  Troponin negative x 2.  No evidence of DKA.  Negative for COVID, flu, RSV.  CMP and lipase otherwise reassuring.  Beta hydroxybutyrate negative.  Is being treated currently for UTI.  Suspect patient's symptoms are combination of her  UTI which is being treated as well as symptomatic hyperglycemia.  She has follow-up with her endocrinology team in 3 days and I think that is the best next step to do at this time as I do not see any obvious admission criteria.  I have recommended she increase her amlodipine  from 10 mg to 15 mg in the morning and follow-up with her PCP as well.  No life-threatening pathology at this time and safe for discharge.  Given strict return precautions.  The patient is on the cardiac monitor to evaluate for evidence of arrhythmia and/or significant  heart rate changes. Clinical Course as of 06/12/23 1240  Sat Jun 12, 2023  1108 No evidence of DKA [DW]  1212 Glucose-Capillary(!): 260 [DW]    Clinical Course User Index [DW] Kandee Orion, MD     FINAL CLINICAL IMPRESSION(S) / ED DIAGNOSES   Final diagnoses:  Hyperglycemia  Hypertension, unspecified type  Uncontrolled hypertension     Rx / DC Orders   ED Discharge Orders     None        Note:  This document was prepared using Dragon voice recognition software and may include unintentional dictation errors.   Kandee Orion, MD 06/12/23 [phone]

## 2023-06-12 NOTE — ED Triage Notes (Signed)
 Blood sugars elevated.  Recently seen for the same.  Has appointment with PCP on 5/27

## 2023-06-12 NOTE — Discharge Instructions (Addendum)
 I would like for you to increase the dose of your amlodipine  that you take each morning from 10 mg to 15 mg.  Please check in with your primary care provider in the next week to see if this is provided any benefit.  I believe that as you are blood sugar gets under better control your symptoms will improve.  Along with completion of treatment with your UTI.  Please follow-up as planned with your endocrinology team in 3 days for ongoing management of your diabetes regimen.  Thank you for the opportunity to take care of you in our Emergency Department. You have been diagnosed with high blood pressure, also known as hypertension. This means that the force of blood against the walls of your blood vessels called is too strong. It also means that your heart has to work harder to move the blood. High blood pressure usually has no symptoms, but over time, it can cause serious health problems such as Heart attack and heart failure Stroke Kidney disease and failure Vision loss With the help from your healthcare provider and some important life style changes, you can manage your blood pressure and protect your health. Please read the instructions provided on hypertension, how to manage it and how to check your blood pressure. Additionally, use the blood pressure log provided to record your blood pressures. Take the blood pressure log with you to your primary care doctor so that they can adjust your blood pressure medications if needed. Please read the instructions on follow-up appointment. Return to the ER or Call 911 right away if you have any of these symptoms: Chest pain or shortness of breath Severe headache Weakness, tingling, or numbness of your face, arms, or legs (especially on 1 side of the body) Sudden change in vision Confusion, trouble speaking, or trouble understanding speech

## 2023-06-15 ENCOUNTER — Ambulatory Visit: Admitting: Family Medicine

## 2023-06-15 ENCOUNTER — Ambulatory Visit: Payer: Self-pay

## 2023-06-15 ENCOUNTER — Encounter: Payer: Self-pay | Admitting: Family Medicine

## 2023-06-15 ENCOUNTER — Encounter: Payer: Self-pay | Admitting: "Endocrinology

## 2023-06-15 ENCOUNTER — Ambulatory Visit (INDEPENDENT_AMBULATORY_CARE_PROVIDER_SITE_OTHER): Admitting: "Endocrinology

## 2023-06-15 VITALS — BP 194/122 | HR 91 | Temp 98.5°F | Ht 66.0 in | Wt 235.9 lb

## 2023-06-15 VITALS — BP 170/100 | HR 90 | Ht 66.0 in | Wt 244.0 lb

## 2023-06-15 DIAGNOSIS — E1165 Type 2 diabetes mellitus with hyperglycemia: Secondary | ICD-10-CM | POA: Diagnosis not present

## 2023-06-15 DIAGNOSIS — I739 Peripheral vascular disease, unspecified: Secondary | ICD-10-CM | POA: Diagnosis not present

## 2023-06-15 DIAGNOSIS — Z794 Long term (current) use of insulin: Secondary | ICD-10-CM

## 2023-06-15 DIAGNOSIS — I5032 Chronic diastolic (congestive) heart failure: Secondary | ICD-10-CM

## 2023-06-15 DIAGNOSIS — I69354 Hemiplegia and hemiparesis following cerebral infarction affecting left non-dominant side: Secondary | ICD-10-CM

## 2023-06-15 DIAGNOSIS — E1169 Type 2 diabetes mellitus with other specified complication: Secondary | ICD-10-CM

## 2023-06-15 DIAGNOSIS — E7800 Pure hypercholesterolemia, unspecified: Secondary | ICD-10-CM

## 2023-06-15 DIAGNOSIS — I10 Essential (primary) hypertension: Secondary | ICD-10-CM

## 2023-06-15 DIAGNOSIS — E785 Hyperlipidemia, unspecified: Secondary | ICD-10-CM | POA: Diagnosis not present

## 2023-06-15 DIAGNOSIS — Z7985 Long-term (current) use of injectable non-insulin antidiabetic drugs: Secondary | ICD-10-CM

## 2023-06-15 MED ORDER — INSULIN LISPRO (1 UNIT DIAL) 100 UNIT/ML (KWIKPEN): 15.0000 [IU] | PEN_INJECTOR | Freq: Three times a day (TID) | SUBCUTANEOUS | 3 refills | Status: DC

## 2023-06-15 MED ORDER — TOUJEO MAX SOLOSTAR 300 UNIT/ML ~~LOC~~ SOPN: 50.0000 [IU] | PEN_INJECTOR | Freq: Every evening | SUBCUTANEOUS | 3 refills | Status: DC

## 2023-06-15 NOTE — Progress Notes (Signed)
 Outpatient Endocrinology Note Karen Newcomer, MD  06/15/23   [name] 1965/12/18 [id number]  Referring Provider: Adeline Hone, [name] Primary Care Provider: [name], [name], [name] Reason for consultation: Subjective   Assessment & Plan  Diagnoses and all orders for this visit:  Uncontrolled type 2 diabetes mellitus with hyperglycemia, with long-term current use of insulin  (HCC) -     insulin  lispro (HUMALOG  KWIKPEN) 100 UNIT/ML KwikPen; Inject 15 Units into the skin 3 (three) times daily. Plus Sliding scale, max dose 80 units a day -     insulin  glargine, 2 Unit Dial , (TOUJEO  MAX SOLOSTAR) 300 UNIT/ML Solostar Pen; Inject 50 Units into the skin at bedtime. Plus sliding scale, max dose 80 units a day  Long-term (current) use of injectable non-insulin  antidiabetic drugs  Long-term insulin  use (HCC)  Pure hypercholesterolemia  Uncontrolled hypertension    Diabetes Type II complicated by CAD, stroke  Lab Results  Component Value Date   GFR 74.92 04/14/2022   Hba1c goal less than 7, current Hba1c is  Lab Results  Component Value Date   HGBA1C 11.0 (A) 02/04/2023   Will recommend the following: 06/15/23 patient has been to the ER twice in the past 3 days because of her high blood pressure, UTI and high blood sugars.  Instructed patient to go to the ER if her blood pressure continues to stay elevated, which has been checked twice today and is still running 170s/100 and patient reports feeling lightheaded.  Instructed diabetes educator to call the patient tomorrow to follow-up on her and as needed thereafter.  Patient to see me back in a week. Trulicity  4.5 mg/week (patient is well tolerating and wants dose escalation) Toujeo  50 units daily in a.m. (take 20 units today) Humalog : 15 units for all meals 15 min before meals Humalog  correction scale: Use in addition to your meal time insulin  based on blood sugars as follows:  151 - 175: 1 unit 176 - 200: 2 units 201 -  225: 3 units 226 - 250: 4 units 251 - 275: 5 units 276 - 300: 6 units 301 - 325: 7 units 326 - 350: 8 units 351 - 375: 9 units 376 - 400: 10 units    Libre 3 is covered, Dexcom not covered  Toujeo  dose changed from 70 to 20 units recently during hospital visit and pt was stopped on Trulicity  4.5 mg weekly due to diarrhea and bloating   No known contraindications/side effects to any of above medications No history of MEN syndrome/medullary thyroid cancer/pancreatitis or pancreatic cancer in self or family Has Glucagon  -discussed and confirmed with patient  -Last LD and Tg are as follows: Lab Results  Component Value Date   LDLCALC 60 02/16/2023    Lab Results  Component Value Date   TRIG 166 (H) 02/16/2023   -On rosuvastatin  40 mg every day, zetia  10 mg every day  -Follow low fat diet and exercise   -Blood pressure goal <140/90 - Microalbumin/creatinine goal is < 30 -Last MA/Cr is as follows: Lab Results  Component Value Date   MICROALBUR 3.6 (H) 04/16/2022   -not on ACE/ARB, uncontrolled blood pressure right now.  Instructed patient to take hydralazine  up to 100 mg 3 times a day as needed, and Coreg  up to 50 mg as needed to keep blood pressure less than 140/90.  If blood pressure continues to stay elevated to go and follow-up with her PCP/ER -diet changes including salt restriction -limit eating outside -counseled BP targets per standards  of diabetes care -uncontrolled blood pressure can lead to retinopathy, nephropathy and cardiovascular and atherosclerotic heart disease  Reviewed and counseled on: -A1C target -Blood sugar targets -Complications of uncontrolled diabetes  -Checking blood sugar before meals and bedtime and bring log next visit -All medications with mechanism of action and side effects -Hypoglycemia management: rule of 15's, Glucagon  Emergency Kit and medical alert ID -low-carb low-fat plate-method diet -At least 20 minutes of physical activity per  day -Annual dilated retinal eye exam and foot exam -compliance and follow up needs -follow up as scheduled or earlier if problem gets worse  Call if blood sugar is less than 70 or consistently above 250    Take a 15 gm snack of carbohydrate at bedtime before you go to sleep if your blood sugar is less than 100.    If you are going to fast after midnight for a test or procedure, ask your physician for instructions on how to reduce/decrease your insulin  dose.    Call if blood sugar is less than 70 or consistently above 250  -Treating a low sugar by rule of 15  (15 gms of sugar every 15 min until sugar is more than 70) If you feel your sugar is low, test your sugar to be sure If your sugar is low (less than 70), then take 15 grams of a fast acting Carbohydrate (3-4 glucose tablets or glucose gel or 4 ounces of juice or regular soda) Recheck your sugar 15 min after treating low to make sure it is more than 70 If sugar is still less than 70, treat again with 15 grams of carbohydrate          Don't drive the hour of hypoglycemia  If unconscious/unable to eat or drink by mouth, use glucagon  injection or nasal spray baqsimi and call 911. Can repeat again in 15 min if still unconscious.  Return in about 1 week (around 06/22/2023).   I have reviewed current medications, nurse's notes, allergies, vital signs, past medical and surgical history, family medical history, and social history for this encounter. Counseled patient on symptoms, examination findings, lab findings, imaging results, treatment decisions and monitoring and prognosis. The patient understood the recommendations and agrees with the treatment plan. All questions regarding treatment plan were fully answered.  Karen Newcomer, MD  06/15/23   History of Present Illness [name] is a 58 y.o. year old female who presents for follow up of Type II diabetes mellitus.  Alila Vasha Arnall was first diagnosed in 1989.    Diabetes education +  Home diabetes regimen: Toujeo  32 units daily in a.m. Humalog : 9 for breakfast, 13 for lunch, and 9 for dinner (13 units if BG>200, otherwise it is 9 units)  Trulicity  3 mg/week   Background history:   She thinks she was initially treated with diet alone and subsequently given metformin With metformin she had nausea and diarrhea but she does not know if she was treated with any other medications until she was started on insulin  in 2003.  Also was on glipizide  in 2015 Most likely she was on Levemir  or Lantus  insulin  for quite some time She was then switched to Toujeo  in 2016 and although she was initially prescribed 90 units twice a day this was subsequently lowered  Her A1c has been occasionally between 7.2 and 7.5 but has been consistently over 9% since 2018  PREVIOUS OMNIPOD PUMP settings with Humalog  U-200:  Basal settings: 12 AM = 2.6, 3 AM = 2.4,  9 AM = 2.7, 3 PM = 3.0 Sensitivity 1: 60 and target 140  Max bolus 6 units Active insulin  time 6 hours  COMPLICATIONS +  MI/Stroke -  retinopathy -  neuropathy -  nephropathy  BLOOD SUGAR DATA  CGM interpretation: At today's visit, we reviewed her CGM downloads. The full report is scanned in the media. Reviewing the CGM trends, BG are "high" across the day.   Physical Exam  BP (!) 170/100   Pulse 90   Ht 5\' 6"  (1.676 m)   Wt 244 lb (110.7 kg)   LMP  (LMP Unknown)   SpO2 98%   BMI 39.38 kg/m    Constitutional: well developed, well nourished Head: normocephalic, atraumatic Eyes: sclera anicteric, no redness Neck: supple Lungs: normal respiratory effort Neurology: alert and oriented Skin: dry, no appreciable rashes Musculoskeletal: no appreciable defects Psychiatric: normal mood and affect Diabetic Foot Exam - Simple   No data filed      Current Medications Patient's Medications  New Prescriptions   No medications on file  Previous Medications   ACCU-CHEK GUIDE TEST TEST STRIP    USE 1  STRIP IN THE MORNING, AT  NOON, AND AT BEDTIME   ALBUTEROL  (ACCUNEB ) 1.25 MG/3ML NEBULIZER SOLUTION    USE 1 VIAL VIA NEBULIZER TWICE  DAILY   ALBUTEROL  (VENTOLIN  HFA) 108 (90 BASE) MCG/ACT INHALER    USE 1 TO 2 INHALATIONS BY MOUTH  EVERY 6 HOURS AS NEEDED   AMITRIPTYLINE  (ELAVIL ) 25 MG TABLET    Take 2-3 tablets (50-75 mg total) by mouth at bedtime.   AMLODIPINE  (NORVASC ) 10 MG TABLET    TAKE 1 TABLET BY MOUTH DAILY   BUPRENORPHINE  (BUTRANS ) 7.5 MCG/HR    Place 1 patch onto the skin once a week.   BUTALBITAL -ACETAMINOPHEN -CAFFEINE  (FIORICET ) 50-325-40 MG TABLET    Take 1-2 tablets by mouth every 6 (six) hours as needed for headache.   CARVEDILOL  (COREG ) 25 MG TABLET    Take 25 mg by mouth 2 (two) times daily.   CEPHALEXIN  (KEFLEX ) 500 MG CAPSULE    Take 1 capsule (500 mg total) by mouth 4 (four) times daily for 5 days.   CHOLECALCIFEROL  (VITAMIN D3) 25 MCG (1000 UNIT) TABLET    Take 1 tablet (1,000 Units total) by mouth daily.   CLOPIDOGREL  (PLAVIX ) 75 MG TABLET    Take 1 tablet (75 mg total) by mouth daily.   CONTINUOUS GLUCOSE SENSOR (FREESTYLE LIBRE 3 PLUS SENSOR) MISC    Inject 1 Device into the skin continuous. Change every 14 days   CONTINUOUS GLUCOSE SENSOR (FREESTYLE LIBRE 3 SENSOR) MISC    1 Device by Does not apply route continuous.   CYCLOSPORINE  (RESTASIS ) 0.05 % OPHTHALMIC EMULSION    Place 2 drops into both eyes 2 (two) times daily.   DULAGLUTIDE  (TRULICITY ) 4.5 MG/0.5ML SOAJ    Inject 4.5 mg as directed once a week.   DULOXETINE  (CYMBALTA ) 60 MG CAPSULE    TAKE 1 CAPSULE BY MOUTH EVERY DAY   EPINEPHRINE  0.3 MG/0.3 ML IJ SOAJ INJECTION    INJECT 1 PEN INTRAMUSCULARLY AS  NEEDED FOR ALLERGIC RESPONSE AS  DIRECTED BY MD. Salina Craver MEDICAL  HELP AFTER USE   EZETIMIBE  (ZETIA ) 10 MG TABLET    TAKE 1 TABLET BY MOUTH DAILY   FUROSEMIDE  (LASIX ) 20 MG TABLET    TAKE 1 TABLET BY MOUTH DAILY   GABAPENTIN  (NEURONTIN ) 600 MG TABLET    600 mg qAM, 600 mg qPM, 1200 mg QHS  GLUCAGON  (GVOKE HYPOPEN   2-PACK) 1 MG/0.2ML SOAJ    INJECT SUB-Q 1MG  ONCE AS NEEDED  FOR UP TO 1 DOSE. ONLY FOR  SEVERE LOW BLOOD SUGAR NOT  RESPONDING TO DRINKING  JUICE/GLUCOSE TABS, WHEN CBG &lt;70   HYDRALAZINE  (APRESOLINE ) 50 MG TABLET    Take 1 tablet by mouth 3 (three) times daily.   INSULIN  PEN NEEDLE (PEN NEEDLES) 32G X 4 MM MISC    1 Device by Does not apply route in the morning, at noon, in the evening, and at bedtime.   LEVETIRACETAM  (KEPPRA ) 500 MG TABLET    Take 500-750 mg by mouth See admin instructions. Take 1 tablet (500mg ) by mouth every morning and take 1 tablets by mouth (750mg ) by mouth every night   MECLIZINE  (ANTIVERT ) 25 MG TABLET    Take 1 tablet (25 mg total) by mouth 3 (three) times daily as needed for dizziness.   MIRABEGRON  ER (MYRBETRIQ ) 50 MG TB24 TABLET    TAKE 1 TABLET BY MOUTH DAILY   PANTOPRAZOLE  (PROTONIX ) 40 MG TABLET    Take 1 tablet (40 mg total) by mouth daily as needed.   POLYETHYLENE GLYCOL (MIRALAX  / GLYCOLAX ) 17 G PACKET    Take 17 g by mouth daily as needed for mild constipation.   ROSUVASTATIN  (CRESTOR ) 40 MG TABLET    TAKE 1 TABLET BY MOUTH DAILY   SENNA-DOCUSATE (SENOKOT-S) 8.6-50 MG TABLET    Take 1 tablet by mouth at bedtime as needed for mild constipation.   TIZANIDINE  (ZANAFLEX ) 4 MG TABLET    Take 1 tablet (4 mg total) by mouth every 8 (eight) hours as needed for muscle spasms.   UBROGEPANT  (UBRELVY ) 50 MG TABS    50 mg or 100 mg taken orally with or without food. If needed, a second dose may be taken at least 2 hours after the initial dose. The maximum dose in a 24-hour period is 200 mg   WIXELA INHUB 250-50 MCG/DOSE AEPB    Inhale 1 puff into the lungs 2 (two) times daily.  Modified Medications   Modified Medication Previous Medication   INSULIN  GLARGINE, 2 UNIT DIAL , (TOUJEO  MAX SOLOSTAR) 300 UNIT/ML SOLOSTAR PEN insulin  glargine, 2 Unit Dial , (TOUJEO  MAX SOLOSTAR) 300 UNIT/ML Solostar Pen      Inject 50 Units into the skin at bedtime. Plus sliding scale, max dose 80  units a day    Inject 33 Units into the skin at bedtime.   INSULIN  LISPRO (HUMALOG  KWIKPEN) 100 UNIT/ML KWIKPEN insulin  lispro (HUMALOG  KWIKPEN) 100 UNIT/ML KwikPen      Inject 15 Units into the skin 3 (three) times daily. Plus Sliding scale, max dose 80 units a day    9u in the a.m.  13u lunchtime 9 u at bedtime.  Sliding scale  Discontinued Medications   No medications on file    Allergies Allergies  Allergen Reactions   Codeine Swelling and Other (See Comments)    Swelling and burning of mouth (inside)   Farxiga  [Dapagliflozin ] Other (See Comments)    Recurrent UTI with farxiga    Lactose Intolerance (Gi) Nausea And Vomiting    Past Medical History Past Medical History:  Diagnosis Date   Anxiety and depression 09/13/2006   Qualifier: Diagnosis of  By: Gaylyn Keas [name], [place]     Arthritis    joint pain    Asthma    COPD (chronic obstructive pulmonary disease) (HCC)    chronic bronchitis    Depression    Diabetes mellitus  since age 51; type 2 IDDM   Diabetic neuropathy, painful (HCC)    FEET AND HANDS   Diabetic retinopathy (HCC)    Diastolic dysfunction    a.  Echo 11/17: EF 60-65%, mild LVH, no RWMA, Gr1DD, mild AI, dilated aortic root measuring 38 mm, mildly dilated ascending aorta   Dilated aortic root (HCC)    38mm by echo 11/2015   Dizziness    secondary to diabetes and hypertension    Epilepsy idiopathic petit mal (HCC)    last seizure 2012;controlled w/ topomax   GERD (gastroesophageal reflux disease)    Glaucoma    NOT ON ANY EYE DROPS    Headache(784.0)    migraines    Heart murmur    born with    History of stress test    a. 11/17: Normal perfusion, EF 53%, normal study   Hx of blood clots    hematomas removed from left side of brain from 11mos to 58yrs old    Hyperlipidemia    Hypertension    Legally blind    left eye    MIGRAINE HEADACHE 09/14/2006   Qualifier: Diagnosis of  By: Gaylyn Keas [name], [place]     Myocardial infarction [facility])    a.  Patient  reported history of without objective documentation   Nephrolithiasis    frequent urination , urination at nite  PT SEEN IN ER 09/22/11 FOR BACK AND RT SIDED PAIN--HAS KNOWN STONE RT URETER AND UA IN ER SHOWED UTI   Pain 09/23/2011   LOWER BACK AND RIGHT SIDE--PT HAS RIGHT URETERAL STONE   Pneumonia    hx of 2009   Pyelonephritis    SBO (small bowel obstruction) (HCC) 04/23/2021   Seizures (HCC)    Sleep apnea    [facility];does not use Cpap ; mild   Small bowel obstruction (HCC) 05/18/2021   Stress incontinence    Stroke (HCC)    last 2003  RESIDUAL LEFT LEG WEAKNESS--NO OTHER RESIDUAL PROBLEMS    Past Surgical History Past Surgical History:  Procedure Laterality Date   BRAIN HEMATOMA EVACUATION     five procedures total, first procedure when 46 months old, last at 58 years of age.   CYSTOSCOPY W/ RETROGRADES  09/24/2011   Procedure: CYSTOSCOPY WITH RETROGRADE PYELOGRAM;  Surgeon: Willye Harvey, MD;  Location: [facility];  Service: Urology;  Laterality: Right;   CYSTOSCOPY WITH URETEROSCOPY  09/24/2011   Procedure: CYSTOSCOPY WITH URETEROSCOPY;  Surgeon: Willye Harvey, MD;  Location: [facility];  Service: Urology;  Laterality: Right;  Balloon dilation right ureter    CYSTOSCOPY/RETROGRADE/URETEROSCOPY  08/24/2011   Procedure: CYSTOSCOPY/RETROGRADE/URETEROSCOPY;  Surgeon: Soledad Dupes, MD;  Location: [facility];  Service: Urology;  Laterality: Right;  Cysto, Right retrograde Pyelogram, right stent placement.    INCISIONAL HERNIA REPAIR  05/05/2011   Procedure: LAPAROSCOPIC INCISIONAL HERNIA;  Surgeon: Enid Harry, MD;  Location: [facility] OR;  Service: General;  Laterality: N/A;   kidney stone removal     LAPAROTOMY N/A 06/12/2021   Procedure: EXPLORATORY LAPAROTOMY, LYSIS OF ADHESIONS;  Surgeon: Emmalene Hare, MD;  Location: [facility];  Service: General;  Laterality: N/A;   MASS EXCISION  05/05/2011   Procedure: EXCISION MASS;  Surgeon: Enid Harry, MD;  Location: [facility] OR;   Service: General;  Laterality: Right;   [facility]     RETINAL DETACHMENT SURGERY Left 1990   RIGHT/LEFT HEART CATH AND CORONARY ANGIOGRAPHY N/A 04/28/2019   Procedure: RIGHT/LEFT HEART CATH AND CORONARY  ANGIOGRAPHY;  Surgeon: Antonette Batters, MD;  Location: [facility];  Service: Cardiovascular;  Laterality: N/A;   SHUNT REMOVAL     shunt inserted at age 78 removed at age 66    VAGINAL HYSTERECTOMY  92    Family History family history includes ADD / ADHD in her maternal grandfather; Birth defects in her daughter and maternal grandfather; Brain cancer in her maternal grandmother; Cancer in her mother; Cirrhosis in her maternal grandfather; Diabetes in her maternal grandfather; Hypertension in her daughter, maternal grandmother, and mother; Uterine cancer in her mother.  Social History Social History   Socioeconomic History   Marital status: Widowed    Spouse name: Not on file   Number of children: 2   Years of education: Not on file   Highest education level: 12th grade  Occupational History   Occupation: disabled  Tobacco Use   Smoking status: Former    Current packs/day: 0.00    Average packs/day: 1 pack/day for 15.0 years (15.0 ttl pk-yrs)    Types: Cigarettes    Start date: 01/20/1972    Quit date: 01/20/1987    Years since quitting: 36.4   Smokeless tobacco: Never   Tobacco comments:    quit more than 20 years  Vaping Use   Vaping status: Never Used  Substance and Sexual Activity   Alcohol  use: Yes    Alcohol /week: 1.0 standard drink of alcohol     Types: 1 Glasses of wine per week   Drug use: No    Comment: hx of marijuana use no longer uses    Sexual activity: Yes    Partners: Male    Birth control/protection: Surgical  Other Topics Concern   Not on file  Social History Narrative   Pt lives with her daughter   Social Drivers of Health   Financial Resource Strain: Low Risk  (04/01/2023)   Overall Financial Resource Strain (CARDIA)    Difficulty of Paying  Living Expenses: Not hard at all  Food Insecurity: No Food Insecurity (04/26/2023)   Hunger Vital Sign    Worried About Running Out of Food in the Last Year: Never true    Ran Out of Food in the Last Year: Never true  Transportation Needs: No Transportation Needs (04/26/2023)   PRAPARE - Administrator, Civil Service (Medical): No    Lack of Transportation (Non-Medical): No  Physical Activity: Insufficiently Active (04/01/2023)   Exercise Vital Sign    Days of Exercise per Week: 2 days    Minutes of Exercise per Session: 10 min  Stress: No Stress Concern Present (04/01/2023)   [employer] of Occupational Health - Occupational Stress Questionnaire    Feeling of Stress : Not at all  Social Connections: Moderately Isolated (04/01/2023)   Social Connection and Isolation Panel [NHANES]    Frequency of Communication with Friends and Family: More than three times a week    Frequency of Social Gatherings with Friends and Family: Once a week    Attends Religious Services: More than 4 times per year    Active Member of [employer] or Organizations: No    Attends Banker Meetings: Never    Marital Status: Widowed  Intimate Partner Violence: Not At Risk (04/26/2023)   Humiliation, Afraid, Rape, and Kick questionnaire    Fear of Current or Ex-Partner: No    Emotionally Abused: No    Physically Abused: No    Sexually Abused: No    Lab Results  Component Value Date   HGBA1C 11.0 (A) 02/04/2023   HGBA1C 9.7 (H) 07/20/2022   HGBA1C 9.8 (A) 04/16/2022   Lab Results  Component Value Date   CHOL 129 02/16/2023   Lab Results  Component Value Date   HDL 45 (L) 02/16/2023   Lab Results  Component Value Date   LDLCALC 60 02/16/2023   Lab Results  Component Value Date   TRIG 166 (H) 02/16/2023   Lab Results  Component Value Date   CHOLHDL 2.9 02/16/2023   Lab Results  Component Value Date   CREATININE 0.83 06/12/2023   Lab Results  Component Value Date   GFR  74.92 04/14/2022   Lab Results  Component Value Date   MICROALBUR 3.6 (H) 04/16/2022      Component Value Date/Time   NA 135 06/12/2023 1024   NA 138 05/08/2015 1106   K 3.7 06/12/2023 1024   CL 98 06/12/2023 1024   CO2 25 06/12/2023 1024   GLUCOSE 306 (H) 06/12/2023 1024   BUN 11 06/12/2023 1024   BUN 14 05/08/2015 1106   CREATININE 0.83 06/12/2023 1024   CREATININE 1.07 (H) 03/15/2023 1537   CALCIUM  9.2 06/12/2023 1024   PROT 7.6 06/12/2023 1024   PROT 7.7 05/08/2015 1106   ALBUMIN  3.9 06/12/2023 1024   ALBUMIN  4.1 05/08/2015 1106   AST 15 06/12/2023 1024   ALT 16 06/12/2023 1024   ALKPHOS 95 06/12/2023 1024   BILITOT 0.5 06/12/2023 1024   BILITOT 0.3 05/08/2015 1106   GFRNONAA >60 06/12/2023 1024   GFRNONAA 81 11/28/2018 0000   GFRAA >60 05/30/2019 1543   GFRAA 94 11/28/2018 0000      Latest Ref Rng & Units 06/12/2023   10:24 AM 06/10/2023    5:49 PM 03/15/2023    3:37 PM  BMP  Glucose 70 - 99 mg/dL 045  409  79   BUN 6 - 20 mg/dL 11  15  14    Creatinine 0.44 - 1.00 mg/dL 8.11  9.14  7.82   BUN/Creat Ratio 6 - 22 (calc)   13   Sodium 135 - 145 mmol/L 135  134  140   Potassium 3.5 - 5.1 mmol/L 3.7  3.9  4.0   Chloride 98 - 111 mmol/L 98  97  96   CO2 22 - 32 mmol/L 25  26  33   Calcium  8.9 - 10.3 mg/dL 9.2  9.2  9.9        Component Value Date/Time   WBC 7.4 06/12/2023 1024   RBC 4.98 06/12/2023 1024   HGB 14.5 06/12/2023 1024   HGB 13.1 05/08/2015 1106   HCT 44.6 06/12/2023 1024   HCT 39.5 05/08/2015 1106   PLT 288 06/12/2023 1024   PLT 274 05/08/2015 1106   MCV 89.6 06/12/2023 1024   MCV 91 05/08/2015 1106   MCH 29.1 06/12/2023 1024   MCHC 32.5 06/12/2023 1024   RDW 13.1 06/12/2023 1024   RDW 14.1 05/08/2015 1106   LYMPHSABS 1.8 06/12/2023 1024   LYMPHSABS 1.7 05/08/2015 1106   MONOABS 0.5 06/12/2023 1024   EOSABS 0.0 06/12/2023 1024   EOSABS 0.0 05/08/2015 1106   BASOSABS 0.0 06/12/2023 1024   BASOSABS 0.0 05/08/2015 1106     Parts of this  note may have been dictated using voice recognition software. There may be variances in spelling and vocabulary which are unintentional. Not all errors are proofread. Please notify the [facility] if any discrepancies are noted or if the meaning  of any statement is not clear.

## 2023-06-15 NOTE — Telephone Encounter (Signed)
  Chief Complaint: hyperglycemia, hypertension Symptoms: blurry vision, headache Frequency: intermittent Pertinent Negatives: Patient denies fever, chest pain, difficulty breathing, tingling, numbness.  Disposition: [] ED /[] Urgent Care (no appt availability in office) / [x] Appointment(In office/virtual)/ []  [place] [facility]/ [] Home Care/ [] Refused Recommended Disposition /[] [place] [facility]/ []  Follow-up with PCP Additional Notes:  ER twice last week for hyperglycemia and hypertension.  Blood sugars elevating and sustaining 300's.  She has been diagnosed with uti, she received iv abx, discharged with oral prescription. Daughter joined call and stated that oral antibiotics due not work for her and needs iv antibiotics.  Evaluated at endocrine today, they said "they don't understand why she is not admitted into the hospital".  Current blood sugar 279. Blood pressure 170/100. She does have blurry vision, mild weakness, mild headache. Acute evaluation advised, scheduled with an alternate provider today. Educated on care advice as documented in protocol, patient verbalized understanding. Discussed reasons to call back.     Copied from CRM [id number]. Topic: Clinical - Red Word Triage >> Jun 15, 2023 11:15 AM [place] wrote: Red Word that prompted transfer to Nurse Triage: Blood pressure 170/100- blood sugar 312 Reason for Disposition  [1] Blood glucose 240 - 300 mg/dL (11.9 - 14.7 mmol/L) AND [2] uses insulin  (e.g., insulin -dependent, all people with type 1 diabetes)  Systolic BP  >= 160 OR Diastolic >= 100  Protocols used: Diabetes - High Blood Sugar-A-AH, Blood Pressure - High-A-AH

## 2023-06-15 NOTE — Progress Notes (Signed)
 Name: [name]   MRN: [id number]    DOB: 1965/08/30   Date:06/15/2023       Progress Note  Subjective  Chief Complaint  Chief Complaint  Patient presents with   Hypertension    Went to ER last week she states they said nothing they can do for her.  Has dizziness and headache   Diabetes    Sugar running high, saw endo this morning   Urinary Tract Infection    In ER had a round of IV antibiotics and was given Keflex  finished.  But thinks still has due to symptoms of burning when urinating   Discussed the use of AI scribe software for clinical note transcription with the patient, who gave verbal consent to proceed.  History of Present Illness [name] "[name]" is a 58 year old female with hypertension, diabetes, and congestive heart failure who presents with uncontrolled blood pressure and high blood sugar.  She has experienced recent episodes of uncontrolled hypertension and hyperglycemia, leading to two emergency room visits. On May 21st, her blood sugar was 575 mg/dL at home and 914 mg/dL upon hospital arrival. She received IV fluids, insulin  (Novolog ), and antibiotics (Rosadan and Keflex ), reducing her blood sugar to 272 mg/dL before discharge. On May 24th, her blood pressure was 217/116 mmHg, decreasing to 176/97 mmHg after treatment. Her blood sugar was 260 mg/dL at that time. Her current blood sugar is 345 mg/dL.  She continues to experience burning during urination, although a urine culture was negative for infection. She reports slight itchiness and has been treated for a yeast infection, symptoms are getting better since  Her medical history includes three strokes and three heart attacks. Her current medications include amlodipine  10 mg daily, carvedilol  25 mg twice daily, Lasix , and hydralazine  50 mg three times daily for hypertension and congestive heart failure. She also takes Plavix  due to CVA. Her blood pressure used to be controlled but is now fluctuating  without a clear cause. There have been no recent changes in medication or increased stress.  She experiences headaches and it is worse than usual since elevation of BP. She has a history of sleep apnea and uses a CPAP machine nightly. No new weakness, tingling, or numbness, although she has residual left-sided weakness from a previous stroke.     Patient Active Problem List   Diagnosis Date Noted   Medication management 05/13/2023   Class 2 severe obesity with serious comorbidity and body mass index (BMI) of 39.0 to 39.9 in adult [facility]) 03/15/2023   Uncontrolled type 2 diabetes mellitus with hyperglycemia (HCC) 11/17/2021   Mild episode of recurrent major depressive disorder (HCC) 08/14/2021   Hypertensive urgency 04/23/2021   GERD without esophagitis 04/23/2021   AKI (acute kidney injury) (HCC) 04/23/2021   Chronic diastolic CHF (congestive heart failure) (HCC) 04/23/2021   Cervical spondylosis 02/12/2021   Lumbar facet arthropathy 02/12/2021   Major depressive disorder, recurrent episode, moderate (HCC) 11/13/2020   Adrenal adenoma, right    PAD (peripheral artery disease) (HCC) 01/24/2020   CAD (coronary artery disease) 04/27/2019   Vitamin D  deficiency 01/24/2018   Overactive bladder 05/18/2017   Dilated aortic root (HCC)    Sacroiliac dysfunction 10/09/2014   Chronic low back pain 09/03/2014   Hemiparesis affecting left side as late effect of stroke (HCC) 09/03/2014   OSA (obstructive sleep apnea) 05/15/2014   Dyslipidemia associated with type 2 diabetes mellitus (HCC) 01/21/2014   Essential hypertension, benign 01/21/2014   Chronic pain  syndrome 01/21/2014   Headache disorder 09/07/2013   Mixed urge and stress incontinence 08/25/2011   BLINDNESS, LEGAL, [place]  DEFINITION 09/14/2006   Seizure (HCC) 09/13/2006    Past Surgical History:  Procedure Laterality Date   BRAIN HEMATOMA EVACUATION     five procedures total, first procedure when 29 months old, last at 58 years of age.    CYSTOSCOPY W/ RETROGRADES  09/24/2011   Procedure: CYSTOSCOPY WITH RETROGRADE PYELOGRAM;  Surgeon: Willye Harvey, MD;  Location: [facility];  Service: Urology;  Laterality: Right;   CYSTOSCOPY WITH URETEROSCOPY  09/24/2011   Procedure: CYSTOSCOPY WITH URETEROSCOPY;  Surgeon: Willye Harvey, MD;  Location: [facility];  Service: Urology;  Laterality: Right;  Balloon dilation right ureter    CYSTOSCOPY/RETROGRADE/URETEROSCOPY  08/24/2011   Procedure: CYSTOSCOPY/RETROGRADE/URETEROSCOPY;  Surgeon: Soledad Dupes, MD;  Location: [facility];  Service: Urology;  Laterality: Right;  Cysto, Right retrograde Pyelogram, right stent placement.    INCISIONAL HERNIA REPAIR  05/05/2011   Procedure: LAPAROSCOPIC INCISIONAL HERNIA;  Surgeon: Enid Harry, MD;  Location: [facility] OR;  Service: General;  Laterality: N/A;   kidney stone removal     LAPAROTOMY N/A 06/12/2021   Procedure: EXPLORATORY LAPAROTOMY, LYSIS OF ADHESIONS;  Surgeon: Emmalene Hare, MD;  Location: [facility];  Service: General;  Laterality: N/A;   MASS EXCISION  05/05/2011   Procedure: EXCISION MASS;  Surgeon: Enid Harry, MD;  Location: [facility] OR;  Service: General;  Laterality: Right;   PCNL     RETINAL DETACHMENT SURGERY Left 1990   RIGHT/LEFT HEART CATH AND CORONARY ANGIOGRAPHY N/A 04/28/2019   Procedure: RIGHT/LEFT HEART CATH AND CORONARY ANGIOGRAPHY;  Surgeon: Antonette Batters, MD;  Location: [facility] INVASIVE CV LAB;  Service: Cardiovascular;  Laterality: N/A;   SHUNT REMOVAL     shunt inserted at age 36 removed at age 68    VAGINAL HYSTERECTOMY  4    Family History  Problem Relation Age of Onset   Uterine cancer Mother    Hypertension Mother    Cancer Mother    Brain cancer Maternal Grandmother    Hypertension Maternal Grandmother    Birth defects Daughter    Hypertension Daughter    Cirrhosis Maternal Grandfather    ADD / ADHD Maternal Grandfather    Birth defects Maternal Grandfather    Diabetes Maternal Grandfather    Anesthesia  problems Neg Hx    Colon cancer Neg Hx    Esophageal cancer Neg Hx    Pancreatic cancer Neg Hx     Social History   Tobacco Use   Smoking status: Former    Current packs/day: 0.00    Average packs/day: 1 pack/day for 15.0 years (15.0 ttl pk-yrs)    Types: Cigarettes    Start date: 01/20/1972    Quit date: 01/20/1987    Years since quitting: 36.4   Smokeless tobacco: Never   Tobacco comments:    quit more than 20 years  Substance Use Topics   Alcohol  use: Yes    Alcohol /week: 1.0 standard drink of alcohol     Types: 1 Glasses of wine per week     Current Outpatient Medications:    ACCU-CHEK GUIDE TEST test strip, USE 1 STRIP IN THE MORNING, AT  NOON, AND AT BEDTIME, Disp: 100 strip, Rfl: 9   albuterol  (ACCUNEB ) 1.25 MG/3ML nebulizer solution, USE 1 VIAL VIA NEBULIZER TWICE  DAILY, Disp: 75 mL, Rfl: 0   albuterol  (VENTOLIN  HFA) 108 (90 Base) MCG/ACT inhaler, USE 1 TO 2 INHALATIONS  BY MOUTH  EVERY 6 HOURS AS NEEDED, Disp: 17 g, Rfl: 2   amitriptyline  (ELAVIL ) 25 MG tablet, Take 2-3 tablets (50-75 mg total) by mouth at bedtime., Disp: 90 tablet, Rfl: 5   amLODipine  (NORVASC ) 10 MG tablet, TAKE 1 TABLET BY MOUTH DAILY, Disp: 90 tablet, Rfl: 1   buprenorphine  (BUTRANS ) 7.5 MCG/HR, Place 1 patch onto the skin once a week., Disp: 4 patch, Rfl: 2   butalbital -acetaminophen -caffeine  (FIORICET ) 50-325-40 MG tablet, Take 1-2 tablets by mouth every 6 (six) hours as needed for headache., Disp: 10 tablet, Rfl: 0   carvedilol  (COREG ) 25 MG tablet, Take 25 mg by mouth 2 (two) times daily., Disp: , Rfl:    cephALEXin  (KEFLEX ) 500 MG capsule, Take 1 capsule (500 mg total) by mouth 4 (four) times daily for 5 days., Disp: 20 capsule, Rfl: 0   cholecalciferol  (VITAMIN D3) 25 MCG (1000 UNIT) tablet, Take 1 tablet (1,000 Units total) by mouth daily., Disp: 90 tablet, Rfl: 1   clopidogrel  (PLAVIX ) 75 MG tablet, Take 1 tablet (75 mg total) by mouth daily., Disp: 90 tablet, Rfl: 0   Continuous Glucose Sensor  (FREESTYLE LIBRE 3 PLUS SENSOR) MISC, Inject 1 Device into the skin continuous. Change every 14 days, Disp: 6 each, Rfl: 3   Continuous Glucose Sensor (FREESTYLE LIBRE 3 SENSOR) MISC, 1 Device by Does not apply route continuous., Disp: 6 each, Rfl: 3   cycloSPORINE  (RESTASIS ) 0.05 % ophthalmic emulsion, Place 2 drops into both eyes 2 (two) times daily., Disp: , Rfl:    Dulaglutide  (TRULICITY ) 4.5 MG/0.5ML SOAJ, Inject 4.5 mg as directed once a week., Disp: 6 mL, Rfl: 3   DULoxetine  (CYMBALTA ) 60 MG capsule, TAKE 1 CAPSULE BY MOUTH EVERY DAY, Disp: 90 capsule, Rfl: 0   EPINEPHrine  0.3 mg/0.3 mL IJ SOAJ injection, INJECT 1 PEN INTRAMUSCULARLY AS  NEEDED FOR ALLERGIC RESPONSE AS  DIRECTED BY MD. SEEK MEDICAL  HELP AFTER USE, Disp: 2 each, Rfl: 1   ezetimibe  (ZETIA ) 10 MG tablet, TAKE 1 TABLET BY MOUTH DAILY, Disp: 100 tablet, Rfl: 1   furosemide  (LASIX ) 20 MG tablet, TAKE 1 TABLET BY MOUTH DAILY, Disp: 90 tablet, Rfl: 1   gabapentin  (NEURONTIN ) 600 MG tablet, 600 mg qAM, 600 mg qPM, 1200 mg QHS, Disp: 120 tablet, Rfl: 5   Glucagon  (GVOKE HYPOPEN  2-PACK) 1 MG/0.2ML SOAJ, INJECT SUB-Q 1MG  ONCE AS NEEDED  FOR UP TO 1 DOSE. ONLY FOR  SEVERE LOW BLOOD SUGAR NOT  RESPONDING TO DRINKING  JUICE/GLUCOSE TABS, WHEN CBG &lt;70, Disp: 1.2 mL, Rfl: 1   hydrALAZINE  (APRESOLINE ) 50 MG tablet, Take 1 tablet by mouth 3 (three) times daily., Disp: , Rfl:    insulin  glargine, 2 Unit Dial , (TOUJEO  MAX SOLOSTAR) 300 UNIT/ML Solostar Pen, Inject 50 Units into the skin at bedtime. Plus sliding scale, max dose 80 units a day, Disp: 51 mL, Rfl: 3   insulin  lispro (HUMALOG  KWIKPEN) 100 UNIT/ML KwikPen, Inject 15 Units into the skin 3 (three) times daily. Plus Sliding scale, max dose 80 units a day, Disp: 51 mL, Rfl: 3   Insulin  Pen Needle (PEN NEEDLES) 32G X 4 MM MISC, 1 Device by Does not apply route in the morning, at noon, in the evening, and at bedtime., Disp: 200 each, Rfl: 2   levETIRAcetam  (KEPPRA ) 500 MG tablet, Take  500-750 mg by mouth See admin instructions. Take 1 tablet (500mg ) by mouth every morning and take 1 tablets by mouth (750mg ) by mouth every night, Disp: , Rfl:  meclizine  (ANTIVERT ) 25 MG tablet, Take 1 tablet (25 mg total) by mouth 3 (three) times daily as needed for dizziness., Disp: 30 tablet, Rfl: 0   mirabegron  ER (MYRBETRIQ ) 50 MG TB24 tablet, TAKE 1 TABLET BY MOUTH DAILY, Disp: 100 tablet, Rfl: 1   pantoprazole  (PROTONIX ) 40 MG tablet, Take 1 tablet (40 mg total) by mouth daily as needed., Disp: 100 tablet, Rfl: 1   polyethylene glycol (MIRALAX  / GLYCOLAX ) 17 g packet, Take 17 g by mouth daily as needed for mild constipation., Disp: 100 each, Rfl: 3   rosuvastatin  (CRESTOR ) 40 MG tablet, TAKE 1 TABLET BY MOUTH DAILY, Disp: 100 tablet, Rfl: 2   senna-docusate (SENOKOT-S) 8.6-50 MG tablet, Take 1 tablet by mouth at bedtime as needed for mild constipation., Disp: 60 tablet, Rfl: 0   tiZANidine  (ZANAFLEX ) 4 MG tablet, Take 1 tablet (4 mg total) by mouth every 8 (eight) hours as needed for muscle spasms., Disp: 90 tablet, Rfl: 2   Ubrogepant  (UBRELVY ) 50 MG TABS, 50 mg or 100 mg taken orally with or without food. If needed, a second dose may be taken at least 2 hours after the initial dose. The maximum dose in a 24-hour period is 200 mg, Disp: 60 tablet, Rfl: 2   WIXELA INHUB 250-50 MCG/DOSE AEPB, Inhale 1 puff into the lungs 2 (two) times daily., Disp: , Rfl:   Allergies  Allergen Reactions   Codeine Swelling and Other (See Comments)    Swelling and burning of mouth (inside)   Farxiga  [Dapagliflozin ] Other (See Comments)    Recurrent UTI with farxiga    Lactose Intolerance (Gi) Nausea And Vomiting    I personally reviewed active problem list, medication list, allergies with the patient/caregiver today.   ROS  Ten systems reviewed and is negative except as mentioned in HPI    Objective Physical Exam   CONSTITUTIONAL: Patient appears well-developed . No distress. HEENT: Head  atraumatic, normocephalic, neck supple. Strabismus right eye , some hypertelorism  CARDIOVASCULAR: Normal rate, regular rhythm and normal heart sounds. No murmur heard. No BLE edema. PULMONARY: Effort normal and breath sounds normal. No respiratory distress. ABDOMINAL: There is no tenderness or distention. PSYCHIATRIC: Patient has a normal mood and affect. Behavior is normal. Judgment and thought content normal. NEUROLOGICAL: Cranial nerves II-XII grossly intact. Normal sensation, normal grip , using cane due to left side hemiparesis   Vitals:   06/15/23 1547  BP: (!) 194/122  Pulse: 91  Temp: 98.5 F (36.9 C)  SpO2: 100%  Weight: 235 lb 14.4 oz (107 kg)  Height: 5\' 6"  (1.676 m)    Body mass index is 38.08 kg/m.  Recent Results (from the past 2160 hours)  CBG monitoring, ED     Status: Abnormal   Collection Time: 06/10/23  5:45 PM  Result Value Ref Range   Glucose-Capillary 390 (H) 70 - 99 mg/dL    Comment: Glucose reference range applies only to samples taken after fasting for at least 8 hours.  Basic metabolic panel     Status: Abnormal   Collection Time: 06/10/23  5:49 PM  Result Value Ref Range   Sodium 134 (L) 135 - 145 mmol/L   Potassium 3.9 3.5 - 5.1 mmol/L   Chloride 97 (L) 98 - 111 mmol/L   CO2 26 22 - 32 mmol/L   Glucose, Bld 438 (H) 70 - 99 mg/dL    Comment: Glucose reference range applies only to samples taken after fasting for at least 8 hours.   BUN  15 6 - 20 mg/dL   Creatinine, Ser 0.45 0.44 - 1.00 mg/dL   Calcium  9.2 8.9 - 10.3 mg/dL   GFR, Estimated >40 >98 mL/min    Comment: (NOTE) Calculated using the CKD-EPI Creatinine Equation (2021)    Anion gap 11 5 - 15    Comment: Performed at [facility], [address] [place], [place] [address]  CBC     Status: None   Collection Time: 06/10/23  5:49 PM  Result Value Ref Range   WBC 7.5 4.0 - 10.5 K/uL   RBC 4.64 3.87 - 5.11 MIL/uL   Hemoglobin 13.3 12.0 - 15.0 g/dL   HCT 78.2 95.6 - 21.3 %    MCV 91.4 80.0 - 100.0 fL   MCH 28.7 26.0 - 34.0 pg   MCHC 31.4 30.0 - 36.0 g/dL   RDW 08.6 57.8 - 46.9 %   Platelets 295 150 - 400 K/uL   nRBC 0.0 0.0 - 0.2 %    Comment: Performed at [facility], [address] [place], [place] [address]  Urinalysis, Routine w reflex microscopic -Urine, Clean Catch     Status: Abnormal   Collection Time: 06/10/23  5:49 PM  Result Value Ref Range   Color, Urine YELLOW (A) YELLOW   APPearance CLOUDY (A) CLEAR   Specific Gravity, Urine 1.030 1.005 - 1.030   pH 6.0 5.0 - 8.0   Glucose, UA >=500 (A) NEGATIVE mg/dL   Hgb urine dipstick NEGATIVE NEGATIVE   Bilirubin Urine NEGATIVE NEGATIVE   Ketones, ur NEGATIVE NEGATIVE mg/dL   Protein, ur NEGATIVE NEGATIVE mg/dL   Nitrite NEGATIVE NEGATIVE   Leukocytes,Ua LARGE (A) NEGATIVE   RBC / HPF 11-20 0 - 5 RBC/hpf   WBC, UA >50 0 - 5 WBC/hpf   Bacteria, UA RARE (A) NONE SEEN   Squamous Epithelial / HPF 11-20 0 - 5 /HPF   Mucus PRESENT     Comment: Performed at [facility], [address], [place], [place] [address]  Urine Culture     Status: Abnormal   Collection Time: 06/10/23  5:49 PM   Specimen: Urine, Clean Catch  Result Value Ref Range   Specimen Description      URINE, CLEAN CATCH Performed at [facility], [address] [place], [place] [address]    Special Requests      NONE Performed at [facility], [address], [place], [place] [address]    Culture (A)     <10,000 COLONIES/mL INSIGNIFICANT GROWTH Performed at [facility], [address] [place], [place] [address]    Report Status 06/11/2023 FINAL   CBG monitoring, ED     Status: Abnormal   Collection Time: 06/10/23 10:48 PM  Result Value Ref Range   Glucose-Capillary 322 (H) 70 - 99 mg/dL    Comment: Glucose reference range applies only to samples taken after fasting for at least 8 hours.  CBG monitoring, ED     Status: Abnormal   Collection Time: 06/12/23 10:11 AM  Result Value  Ref Range   Glucose-Capillary 331 (H) 70 - 99 mg/dL    Comment: Glucose reference range applies only to samples taken after fasting for at least 8 hours.  CBC with Differential     Status: None   Collection Time: 06/12/23 10:24 AM  Result Value Ref Range   WBC 7.4 4.0 - 10.5 K/uL   RBC 4.98 3.87 - 5.11 MIL/uL   Hemoglobin 14.5 12.0 -  15.0 g/dL   HCT 16.1 09.6 - 04.5 %   MCV 89.6 80.0 - 100.0 fL   MCH 29.1 26.0 - 34.0 pg   MCHC 32.5 30.0 - 36.0 g/dL   RDW 40.9 81.1 - 91.4 %   Platelets 288 150 - 400 K/uL   nRBC 0.0 0.0 - 0.2 %   Neutrophils Relative % 69 %   Neutro Abs 5.1 1.7 - 7.7 K/uL   Lymphocytes Relative 24 %   Lymphs Abs 1.8 0.7 - 4.0 K/uL   Monocytes Relative 7 %   Monocytes Absolute 0.5 0.1 - 1.0 K/uL   Eosinophils Relative 0 %   Eosinophils Absolute 0.0 0.0 - 0.5 K/uL   Basophils Relative 0 %   Basophils Absolute 0.0 0.0 - 0.1 K/uL   Immature Granulocytes 0 %   Abs Immature Granulocytes 0.02 0.00 - 0.07 K/uL    Comment: Performed at [facility], [address] [place], [place] [address]  Comprehensive metabolic panel     Status: Abnormal   Collection Time: 06/12/23 10:24 AM  Result Value Ref Range   Sodium 135 135 - 145 mmol/L   Potassium 3.7 3.5 - 5.1 mmol/L   Chloride 98 98 - 111 mmol/L   CO2 25 22 - 32 mmol/L   Glucose, Bld 306 (H) 70 - 99 mg/dL    Comment: Glucose reference range applies only to samples taken after fasting for at least 8 hours.   BUN 11 6 - 20 mg/dL   Creatinine, Ser 6.21 0.44 - 1.00 mg/dL   Calcium  9.2 8.9 - 10.3 mg/dL   Total Protein 7.6 6.5 - 8.1 g/dL   Albumin  3.9 3.5 - 5.0 g/dL   AST 15 15 - 41 U/L   ALT 16 0 - 44 U/L   Alkaline Phosphatase 95 38 - 126 U/L   Total Bilirubin 0.5 0.0 - 1.2 mg/dL   GFR, Estimated >30 >86 mL/min    Comment: (NOTE) Calculated using the CKD-EPI Creatinine Equation (2021)    Anion gap 12 5 - 15    Comment: Performed at [facility], [address], [place], [place] [address]   Troponin I (High Sensitivity)     Status: None   Collection Time: 06/12/23 10:24 AM  Result Value Ref Range   Troponin I (High Sensitivity) 9 <18 ng/L    Comment: (NOTE) Elevated high sensitivity troponin I (hsTnI) values and significant  changes across serial measurements may suggest ACS but many other  chronic and acute conditions are known to elevate hsTnI results.  Refer to the "Links" section for chest pain algorithms and additional  guidance. Performed at [facility], [address] [place], [place] [address]   Lipase, blood     Status: None   Collection Time: 06/12/23 10:24 AM  Result Value Ref Range   Lipase 45 11 - 51 U/L    Comment: Performed at [facility], [address] [place], [place] [address]  Beta-hydroxybutyric acid     Status: Abnormal   Collection Time: 06/12/23 10:24 AM  Result Value Ref Range   Beta-Hydroxybutyric Acid 0.33 (H) 0.05 - 0.27 mmol/L    Comment: Performed at [facility], [address] [place], [place] [address]  Resp panel by RT-PCR (RSV, Flu A&B, Covid) Anterior Nasal Swab     Status: None   Collection Time: 06/12/23 10:24 AM   Specimen: Anterior Nasal Swab  Result Value Ref Range   SARS Coronavirus 2 by RT  PCR NEGATIVE NEGATIVE    Comment: (NOTE) SARS-CoV-2 target nucleic acids are NOT DETECTED.  The SARS-CoV-2 RNA is generally detectable in upper respiratory specimens during the acute phase of infection. The lowest concentration of SARS-CoV-2 viral copies this assay can detect is 138 copies/mL. A negative result does not preclude SARS-Cov-2 infection and should not be used as the sole basis for treatment or other patient management decisions. A negative result may occur with  improper specimen collection/handling, submission of specimen other than nasopharyngeal swab, presence of viral mutation(s) within the areas targeted by this assay, and inadequate number of viral copies(<138 copies/mL). A  negative result must be combined with clinical observations, patient history, and epidemiological information. The expected result is Negative.  Fact Sheet for Patients:  BloggerCourse.com  Fact Sheet for Healthcare Providers:  SeriousBroker.it  This test is no t yet approved or cleared by the [place]  FDA and  has been authorized for detection and/or diagnosis of SARS-CoV-2 by FDA under an Emergency Use Authorization (EUA). This EUA will remain  in effect (meaning this test can be used) for the duration of the COVID-19 declaration under Section 564(b)(1) of the Act, 21 U.S.C.section 360bbb-3(b)(1), unless the authorization is terminated  or revoked sooner.       Influenza A by PCR NEGATIVE NEGATIVE   Influenza B by PCR NEGATIVE NEGATIVE    Comment: (NOTE) The Xpert Xpress SARS-CoV-2/FLU/RSV plus assay is intended as an aid in the diagnosis of influenza from Nasopharyngeal swab specimens and should not be used as a sole basis for treatment. Nasal washings and aspirates are unacceptable for Xpert Xpress SARS-CoV-2/FLU/RSV testing.  Fact Sheet for Patients: BloggerCourse.com  Fact Sheet for Healthcare Providers: SeriousBroker.it  This test is not yet approved or cleared by the [place]  FDA and has been authorized for detection and/or diagnosis of SARS-CoV-2 by FDA under an Emergency Use Authorization (EUA). This EUA will remain in effect (meaning this test can be used) for the duration of the COVID-19 declaration under Section 564(b)(1) of the Act, 21 U.S.C. section 360bbb-3(b)(1), unless the authorization is terminated or revoked.     Resp Syncytial Virus by PCR NEGATIVE NEGATIVE    Comment: (NOTE) Fact Sheet for Patients: BloggerCourse.com  Fact Sheet for Healthcare Providers: SeriousBroker.it  This test is not  yet approved or cleared by the [place]  FDA and has been authorized for detection and/or diagnosis of SARS-CoV-2 by FDA under an Emergency Use Authorization (EUA). This EUA will remain in effect (meaning this test can be used) for the duration of the COVID-19 declaration under Section 564(b)(1) of the Act, 21 U.S.C. section 360bbb-3(b)(1), unless the authorization is terminated or revoked.  Performed at [name], [address] [place], [place] [address]   Urinalysis, Routine w reflex microscopic -Urine, Clean Catch     Status: Abnormal   Collection Time: 06/12/23 11:50 AM  Result Value Ref Range   Color, Urine YELLOW (A) YELLOW   APPearance HAZY (A) CLEAR   Specific Gravity, Urine 1.017 1.005 - 1.030   pH 7.0 5.0 - 8.0   Glucose, UA >=500 (A) NEGATIVE mg/dL   Hgb urine dipstick NEGATIVE NEGATIVE   Bilirubin Urine NEGATIVE NEGATIVE   Ketones, ur 5 (A) NEGATIVE mg/dL   Protein, ur NEGATIVE NEGATIVE mg/dL   Nitrite NEGATIVE NEGATIVE   Leukocytes,Ua SMALL (A) NEGATIVE   RBC / HPF 0-5 0 - 5 RBC/hpf   WBC, UA 6-10 0 - 5 WBC/hpf   Bacteria, UA RARE (A) NONE  SEEN   Squamous Epithelial / HPF 0-5 0 - 5 /HPF    Comment: Performed at [facility], [address] [place], [place] [address]  POC CBG, ED     Status: Abnormal   Collection Time: 06/12/23 12:05 PM  Result Value Ref Range   Glucose-Capillary 260 (H) 70 - 99 mg/dL    Comment: Glucose reference range applies only to samples taken after fasting for at least 8 hours.  Troponin I (High Sensitivity)     Status: None   Collection Time: 06/12/23 12:09 PM  Result Value Ref Range   Troponin I (High Sensitivity) 8 <18 ng/L    Comment: (NOTE) Elevated high sensitivity troponin I (hsTnI) values and significant  changes across serial measurements may suggest ACS but many other  chronic and acute conditions are known to elevate hsTnI results.  Refer to the "Links" section for chest pain algorithms and  additional  guidance. Performed at [facility], [address] [place], [place] [address]      PHQ2/9:    06/15/2023    3:51 PM 05/13/2023   12:33 PM 04/01/2023    8:54 AM 03/15/2023    2:56 PM 02/18/2023    1:31 PM  Depression screen PHQ 2/9  Decreased Interest 0 0 0 0 2  Down, Depressed, Hopeless 0 0 0 0 0  PHQ - 2 Score 0 0 0 0 2  Altered sleeping 0  0 0   Tired, decreased energy 0  0 0   Change in appetite 0  0 0   Feeling bad or failure about yourself  0  0 0   Trouble concentrating 0  0 0   Moving slowly or fidgety/restless 0  0 0   Suicidal thoughts 0  0 0   PHQ-9 Score 0  0 0   Difficult doing work/chores Not difficult at all        phq 9 is negative  Fall Risk:    06/15/2023    3:51 PM 05/13/2023   12:33 PM 04/01/2023    8:57 AM 02/18/2023    1:31 PM 10/27/2022   12:59 PM  Fall Risk   Falls in the past year? 0 0 0 1 1  Number falls in past yr: 0  0 1 1  Injury with Fall? 0  0 0 1  Comment     busted head last head  Risk for fall due to :   No Fall Risks Impaired balance/gait;Impaired mobility;History of fall(s) Impaired mobility  Risk for fall due to: Comment     BS dropped and was dizzy  Follow up Falls evaluation completed  Falls prevention discussed;Falls evaluation completed Falls evaluation completed;Education provided       Assessment & Plan Uncontrolled hypertension Hypertension remains uncontrolled with significant fluctuations. History of strokes and myocardial infarctions increases risk. Current medications include amlodipine , carvedilol , furosemide , and hydralazine . Clonidine  caused hypotension. Kidney function normal, no recent myocardial infarction. - She has a nephrologist in [place] - no notes on Epic -  explained she needs to go back this week.  - Avoid clonidine  due to significant hypotension when she took it in the past  - Contact cardiologist for follow-up. - Order CT of the brain offered to do it stat by she is tired and  prefers having an scheduled appointment tomorrow  Type 2 diabetes mellitus with hyperglycemia Diabetes poorly controlled with glucose readings up to 575 mg/dL. Endocrinology adjusted Humalog  and [place]. Hyperglycemia may contribute to  urinary symptoms and potential yeast infection. - Adjust Humalog  and Ttrulicity also pre meal insulin   as per endocrinologist's recommendations. - Monitor blood glucose levels. - Reduce carbohydrate intake.  Chronic diastolic heart failure Managed with furosemide . No acute exacerbation noted.  Stroke with left-sided weakness Residual left-sided weakness from previous stroke. No new neurological deficits. CT head drom last year  showed small vessel disease,  new CT planned due to headaches and dizziness ( same symptoms as old CVA) . - Order CT of the brain. - Monitor for new neurological symptoms and go back to [facility] if needed   Epilepsy Epilepsy since childhood, no recent seizures. Continues on medication.   Follow-up Follow-up plans discussed for management of health issues. Advised to rest, monitor blood pressure and glucose, seek emergency care if new symptoms occur. - Schedule CT of the brain for tomorrow. - Follow up with nephrologist. - Contact cardiologist. - Monitor blood pressure and glucose. - Seek emergency care if new symptoms occur.

## 2023-06-15 NOTE — Patient Instructions (Addendum)
 Will recommend the following: Trulicity  4.5 mg/week (patient is well tolerating and wants dose escalation) Toujeo  50 units daily in a.m. (take 20 units today) Humalog : 15 units for all meals 15 min before meals Humalog  correction scale: Use in addition to your meal time insulin  based on blood sugars as follows:  151 - 175: 1 unit 176 - 200: 2 units 201 - 225: 3 units 226 - 250: 4 units 251 - 275: 5 units 276 - 300: 6 units 301 - 325: 7 units 326 - 350: 8 units 351 - 375: 9 units 376 - 400: 10 units

## 2023-06-16 ENCOUNTER — Telehealth: Payer: Self-pay | Admitting: Nutrition

## 2023-06-16 ENCOUNTER — Ambulatory Visit
Admission: RE | Admit: 2023-06-16 | Discharge: 2023-06-16 | Disposition: A | Discharge status: Home / Self Care | Source: Ambulatory Visit | Attending: Family Medicine | Admitting: Family Medicine

## 2023-06-16 DIAGNOSIS — I739 Peripheral vascular disease, unspecified: Secondary | ICD-10-CM | POA: Diagnosis not present

## 2023-06-16 DIAGNOSIS — I6782 Cerebral ischemia: Secondary | ICD-10-CM | POA: Diagnosis not present

## 2023-06-16 DIAGNOSIS — I10 Essential (primary) hypertension: Secondary | ICD-10-CM | POA: Insufficient documentation

## 2023-06-16 DIAGNOSIS — R519 Headache, unspecified: Secondary | ICD-10-CM | POA: Diagnosis not present

## 2023-06-16 DIAGNOSIS — I69354 Hemiplegia and hemiparesis following cerebral infarction affecting left non-dominant side: Secondary | ICD-10-CM | POA: Insufficient documentation

## 2023-06-16 NOTE — Telephone Encounter (Signed)
 Patient reports having taken the 50u of Toujeo  this morning, and FBS was 375.  She took 29u of Humalog  and ate 2 eggs and sausage.  No carbs.  Blood sugar was reading high.  ACL: blood sugar was down to 376.Aaron Aas She took 29u of Humalog , at 2 pieces of baked chicken and no carbs, and sensor sugar is reading Hi.  She was told to test her blood sugar with a meter.  It read 392.  She is using a LIbre 3 Plus.  She is only drinking water .  No juice, no other liquids.

## 2023-06-16 NOTE — Telephone Encounter (Signed)
 Lvm for pt to call back regarding increase in insulin .

## 2023-06-17 NOTE — Telephone Encounter (Signed)
 Lvm for pt to call back regarding increase in insulin .

## 2023-06-17 NOTE — Telephone Encounter (Signed)
 Spoke to pt regarding change in her insulin  per Dr Vertell Gory recommendation. I also sent the changes that dr Vertell Gory wanted to pt mychart per pt request.

## 2023-06-18 ENCOUNTER — Ambulatory Visit: Admitting: "Endocrinology

## 2023-06-21 DIAGNOSIS — Z8673 Personal history of transient ischemic attack (TIA), and cerebral infarction without residual deficits: Secondary | ICD-10-CM | POA: Diagnosis not present

## 2023-06-21 DIAGNOSIS — G4733 Obstructive sleep apnea (adult) (pediatric): Secondary | ICD-10-CM | POA: Diagnosis not present

## 2023-06-21 DIAGNOSIS — I10 Essential (primary) hypertension: Secondary | ICD-10-CM | POA: Diagnosis not present

## 2023-06-21 DIAGNOSIS — J449 Chronic obstructive pulmonary disease, unspecified: Secondary | ICD-10-CM | POA: Diagnosis not present

## 2023-06-22 ENCOUNTER — Ambulatory Visit: Admitting: "Endocrinology

## 2023-06-23 ENCOUNTER — Inpatient Hospital Stay: Admitting: Family Medicine

## 2023-06-30 ENCOUNTER — Other Ambulatory Visit: Payer: Self-pay | Admitting: *Deleted

## 2023-06-30 DIAGNOSIS — G890 Central pain syndrome: Secondary | ICD-10-CM

## 2023-06-30 DIAGNOSIS — E1142 Type 2 diabetes mellitus with diabetic polyneuropathy: Secondary | ICD-10-CM

## 2023-06-30 DIAGNOSIS — G894 Chronic pain syndrome: Secondary | ICD-10-CM

## 2023-06-30 MED ORDER — AMITRIPTYLINE HCL 25 MG PO TABS: 50.0000 mg | ORAL_TABLET | Freq: Every day | ORAL | 5 refills | Status: DC

## 2023-07-09 ENCOUNTER — Other Ambulatory Visit: Payer: Self-pay | Admitting: *Deleted

## 2023-07-09 DIAGNOSIS — E1142 Type 2 diabetes mellitus with diabetic polyneuropathy: Secondary | ICD-10-CM

## 2023-07-09 DIAGNOSIS — G894 Chronic pain syndrome: Secondary | ICD-10-CM

## 2023-07-09 DIAGNOSIS — G890 Central pain syndrome: Secondary | ICD-10-CM

## 2023-07-09 NOTE — Progress Notes (Unsigned)
 Safety precautions to be maintained throughout the outpatient stay will include: orient to surroundings, keep bed in low position, maintain call bell within reach at all times, provide assistance with transfer out of bed and ambulation.

## 2023-07-12 MED ORDER — GABAPENTIN 600 MG PO TABS
ORAL_TABLET | ORAL | 5 refills | Status: DC
Start: 2023-07-12 — End: 2023-11-18

## 2023-07-16 ENCOUNTER — Ambulatory Visit: Payer: Self-pay | Admitting: Family Medicine

## 2023-07-21 DIAGNOSIS — I10 Essential (primary) hypertension: Secondary | ICD-10-CM | POA: Diagnosis not present

## 2023-07-21 DIAGNOSIS — G4733 Obstructive sleep apnea (adult) (pediatric): Secondary | ICD-10-CM | POA: Diagnosis not present

## 2023-07-21 DIAGNOSIS — Z8673 Personal history of transient ischemic attack (TIA), and cerebral infarction without residual deficits: Secondary | ICD-10-CM | POA: Diagnosis not present

## 2023-07-21 DIAGNOSIS — J449 Chronic obstructive pulmonary disease, unspecified: Secondary | ICD-10-CM | POA: Diagnosis not present

## 2023-07-25 ENCOUNTER — Observation Stay
Admission: EM | Admit: 2023-07-25 | Discharge: 2023-07-30 | Disposition: A | Discharge status: Skilled Nursing Facility | Attending: Internal Medicine | Admitting: Internal Medicine

## 2023-07-25 ENCOUNTER — Emergency Department

## 2023-07-25 ENCOUNTER — Other Ambulatory Visit: Payer: Self-pay

## 2023-07-25 DIAGNOSIS — M25579 Pain in unspecified ankle and joints of unspecified foot: Secondary | ICD-10-CM | POA: Diagnosis present

## 2023-07-25 DIAGNOSIS — I2510 Atherosclerotic heart disease of native coronary artery without angina pectoris: Secondary | ICD-10-CM | POA: Diagnosis not present

## 2023-07-25 DIAGNOSIS — K219 Gastro-esophageal reflux disease without esophagitis: Secondary | ICD-10-CM | POA: Diagnosis present

## 2023-07-25 DIAGNOSIS — M7732 Calcaneal spur, left foot: Secondary | ICD-10-CM | POA: Diagnosis not present

## 2023-07-25 DIAGNOSIS — Z6839 Body mass index (BMI) 39.0-39.9, adult: Secondary | ICD-10-CM | POA: Insufficient documentation

## 2023-07-25 DIAGNOSIS — I10 Essential (primary) hypertension: Secondary | ICD-10-CM | POA: Diagnosis present

## 2023-07-25 DIAGNOSIS — E1142 Type 2 diabetes mellitus with diabetic polyneuropathy: Secondary | ICD-10-CM

## 2023-07-25 DIAGNOSIS — S92352A Displaced fracture of fifth metatarsal bone, left foot, initial encounter for closed fracture: Secondary | ICD-10-CM

## 2023-07-25 DIAGNOSIS — G40A09 Absence epileptic syndrome, not intractable, without status epilepticus: Secondary | ICD-10-CM

## 2023-07-25 DIAGNOSIS — Z794 Long term (current) use of insulin: Secondary | ICD-10-CM | POA: Diagnosis not present

## 2023-07-25 DIAGNOSIS — E66812 Obesity, class 2: Secondary | ICD-10-CM

## 2023-07-25 DIAGNOSIS — M25572 Pain in left ankle and joints of left foot: Secondary | ICD-10-CM | POA: Diagnosis not present

## 2023-07-25 DIAGNOSIS — I5032 Chronic diastolic (congestive) heart failure: Secondary | ICD-10-CM | POA: Diagnosis not present

## 2023-07-25 DIAGNOSIS — I69354 Hemiplegia and hemiparesis following cerebral infarction affecting left non-dominant side: Secondary | ICD-10-CM | POA: Diagnosis not present

## 2023-07-25 DIAGNOSIS — J449 Chronic obstructive pulmonary disease, unspecified: Secondary | ICD-10-CM | POA: Diagnosis not present

## 2023-07-25 DIAGNOSIS — E1165 Type 2 diabetes mellitus with hyperglycemia: Secondary | ICD-10-CM | POA: Diagnosis not present

## 2023-07-25 DIAGNOSIS — G890 Central pain syndrome: Secondary | ICD-10-CM

## 2023-07-25 DIAGNOSIS — G4733 Obstructive sleep apnea (adult) (pediatric): Secondary | ICD-10-CM | POA: Diagnosis not present

## 2023-07-25 DIAGNOSIS — R2681 Unsteadiness on feet: Secondary | ICD-10-CM | POA: Diagnosis not present

## 2023-07-25 DIAGNOSIS — W19XXXA Unspecified fall, initial encounter: Secondary | ICD-10-CM

## 2023-07-25 DIAGNOSIS — E1169 Type 2 diabetes mellitus with other specified complication: Secondary | ICD-10-CM | POA: Diagnosis present

## 2023-07-25 DIAGNOSIS — R54 Age-related physical debility: Secondary | ICD-10-CM | POA: Diagnosis not present

## 2023-07-25 DIAGNOSIS — E1140 Type 2 diabetes mellitus with diabetic neuropathy, unspecified: Secondary | ICD-10-CM | POA: Diagnosis not present

## 2023-07-25 DIAGNOSIS — R55 Syncope and collapse: Principal | ICD-10-CM | POA: Diagnosis present

## 2023-07-25 DIAGNOSIS — R569 Unspecified convulsions: Secondary | ICD-10-CM

## 2023-07-25 DIAGNOSIS — I70209 Unspecified atherosclerosis of native arteries of extremities, unspecified extremity: Secondary | ICD-10-CM | POA: Insufficient documentation

## 2023-07-25 DIAGNOSIS — I110 Hypertensive heart disease with heart failure: Secondary | ICD-10-CM | POA: Diagnosis not present

## 2023-07-25 DIAGNOSIS — G894 Chronic pain syndrome: Secondary | ICD-10-CM | POA: Diagnosis not present

## 2023-07-25 DIAGNOSIS — I739 Peripheral vascular disease, unspecified: Secondary | ICD-10-CM | POA: Diagnosis present

## 2023-07-25 DIAGNOSIS — M25551 Pain in right hip: Secondary | ICD-10-CM

## 2023-07-25 DIAGNOSIS — G8929 Other chronic pain: Secondary | ICD-10-CM | POA: Diagnosis present

## 2023-07-25 DIAGNOSIS — Y92009 Unspecified place in unspecified non-institutional (private) residence as the place of occurrence of the external cause: Secondary | ICD-10-CM

## 2023-07-25 DIAGNOSIS — W1830XA Fall on same level, unspecified, initial encounter: Secondary | ICD-10-CM | POA: Diagnosis not present

## 2023-07-25 DIAGNOSIS — F17210 Nicotine dependence, cigarettes, uncomplicated: Secondary | ICD-10-CM | POA: Diagnosis not present

## 2023-07-25 DIAGNOSIS — M79671 Pain in right foot: Secondary | ICD-10-CM | POA: Diagnosis not present

## 2023-07-25 DIAGNOSIS — Z9181 History of falling: Secondary | ICD-10-CM | POA: Insufficient documentation

## 2023-07-25 DIAGNOSIS — M25571 Pain in right ankle and joints of right foot: Secondary | ICD-10-CM | POA: Diagnosis not present

## 2023-07-25 HISTORY — DX: Heart failure, unspecified: I50.9

## 2023-07-25 LAB — CBC
HCT: 41.4 % (ref 36.0–46.0)
Hemoglobin: 13 g/dL (ref 12.0–15.0)
MCH: 28.6 pg (ref 26.0–34.0)
MCHC: 31.4 g/dL (ref 30.0–36.0)
MCV: 91.2 fL (ref 80.0–100.0)
Platelets: 291 K/uL (ref 150–400)
RBC: 4.54 MIL/uL (ref 3.87–5.11)
RDW: 14.1 % (ref 11.5–15.5)
WBC: 11.2 K/uL — ABNORMAL HIGH (ref 4.0–10.5)
nRBC: 0 % (ref 0.0–0.2)

## 2023-07-25 LAB — BASIC METABOLIC PANEL WITH GFR
Anion gap: 9 (ref 5–15)
BUN: 16 mg/dL (ref 6–20)
CO2: 26 mmol/L (ref 22–32)
Calcium: 8.8 mg/dL — ABNORMAL LOW (ref 8.9–10.3)
Chloride: 104 mmol/L (ref 98–111)
Creatinine, Ser: 0.93 mg/dL (ref 0.44–1.00)
GFR, Estimated: >60 mL/min (ref >60)
Glucose, Bld: 313 mg/dL — ABNORMAL HIGH (ref 70–99)
Potassium: 4.1 mmol/L (ref 3.5–5.1)
Sodium: 139 mmol/L (ref 135–145)

## 2023-07-25 LAB — TROPONIN I (HIGH SENSITIVITY): Troponin I (High Sensitivity): 16 ng/L (ref <18)

## 2023-07-25 MED ORDER — HYDROCODONE-ACETAMINOPHEN 5-325 MG PO TABS
1.0000 | ORAL_TABLET | Freq: Once | ORAL | Status: AC
  Administered 2023-07-25: 1 via ORAL
  Filled 2023-07-25: qty 1

## 2023-07-25 MED ORDER — SODIUM CHLORIDE 0.9 % IV BOLUS
500.0000 mL | Freq: Once | INTRAVENOUS | Status: AC
Start: 2023-07-25 — End: 2023-07-26
  Administered 2023-07-26: 500 mL via INTRAVENOUS

## 2023-07-25 NOTE — ED Provider Notes (Signed)
 [facility] Provider Note    Event Date/Time   First MD Initiated Contact with Patient 07/25/23 1953     (approximate)   History   Fall, Ankle Pain, and Dizziness   HPI  [name] is a 58 y.o. female with a history of hypertension, type 2 diabetes, CAD and chronic pain who presents with bilateral foot and ankle pain after fall this morning.  The patient states that she was getting ready to go to church this morning, started to feel lightheaded, and then passed out, causing her to fall.  Subsequently she noted bilateral foot and ankle pain, worse on the left.  She has not been able to bear weight on the left ankle.  She can bear some weight on the right foot and ankle but needed to be assisted to walk earlier today.  Subsequent this evening she was not able to get up or walk at all.  She took gabapentin  without relief.  She denies any other injuries.  She no longer feels dizzy or lightheaded.  She has no chest pain or difficulty breathing.  I reviewed the past medical records.  The patient's most recent outpatient encounter was on 5/27 with family medicine for follow-up due to elevated blood pressure after 2 prior ED visits with hypertension and a UTI.   Physical Exam   Triage Vital Signs: ED Triage Vitals  Encounter Vitals Group     BP 07/25/23 1845 (!) 157/96     Girls Systolic BP Percentile --      Girls Diastolic BP Percentile --      Boys Systolic BP Percentile --      Boys Diastolic BP Percentile --      Pulse Rate 07/25/23 1845 99     Resp 07/25/23 1845 20     Temp 07/25/23 1845 98.5 F (36.9 C)     Temp Source 07/25/23 1845 Oral     SpO2 07/25/23 1845 99 %     Weight 07/25/23 1846 235 lb (106.6 kg)     Height 07/25/23 1846 5' 6 (1.676 m)     Head Circumference --      Peak Flow --      Pain Score 07/25/23 1847 10     Pain Loc --      Pain Education --      Exclude from Growth Chart --     Most recent vital signs: Vitals:    07/25/23 1845  BP: (!) 157/96  Pulse: 99  Resp: 20  Temp: 98.5 F (36.9 C)  SpO2: 99%     General: Alert and oriented, no distress.  CV:  Good peripheral perfusion.  Resp:  Normal effort.  Abd:  No distention.  Other:  Bilateral ankle swelling and tenderness worse on the left.  Tenderness to the midfoot on the left as well.  2+ DP pulses, normal cap refill, normal motor and sensory to bilateral feet.   ED Results / Procedures / Treatments   Labs (all labs ordered are listed, but only abnormal results are displayed) Labs Reviewed  BASIC METABOLIC PANEL WITH GFR - Abnormal; Notable for the following components:      Result Value   Glucose, Bld 313 (*)    Calcium  8.8 (*)    All other components within normal limits  CBC - Abnormal; Notable for the following components:   WBC 11.2 (*)    All other components within normal limits  TROPONIN I (HIGH SENSITIVITY)  EKG  ED ECG REPORT I, Waylon Cassis, the attending physician, personally viewed and interpreted this ECG.  Date: 07/25/2023 EKG Time: 1846 Rate: 105 Rhythm: Sinus tachycardia QRS Axis: normal Intervals: normal ST/T Wave abnormalities: normal Narrative Interpretation: no evidence of acute ischemia    RADIOLOGY  Chest x-ray: I independently viewed and interpreted the images; there is no focal consolidation or edema  XR R ankle: No acute fracture  XR L ankle: No acute fracture   PROCEDURES:  Critical Care performed: No  Procedures   MEDICATIONS ORDERED IN ED: Medications  sodium chloride  0.9 % bolus 500 mL (has no administration in time range)  HYDROcodone -acetaminophen  (NORCO/VICODIN) 5-325 MG per tablet 1 tablet (1 tablet Oral Given 07/25/23 2011)     IMPRESSION / MDM / ASSESSMENT AND PLAN / ED COURSE  I reviewed the triage vital signs and the nursing notes.  58 year old female with PMH as noted above presents after syncopal episode this morning causing her to fall and injury both  feet and ankles.  She is unable to bear weight on the left leg due to this and also has some pain on the right with difficulty bearing weight.  Differential diagnosis includes, but is not limited to:  Syncope: This is most consistent with vasovagal.  There is no evidence of cardiac etiology.  EKG is unremarkable.  BMP and CBC show no acute findings except for hyperglycemia.  Troponin is negative.  Given that it has been many hours since the syncopal event there is no indication for repeat.  The patient is asymptomatic and does not require further workup.  Foot/ankle injuries: X-rays were obtained of bilateral ankles which are negative for acute fracture.  I suspect most likely ankle sprain, worse on the left.  However the patient is tender in her feet as well especially on the left side of order x-rays.  We will attempt to place the patient in a cam boot and see if she can ambulate with a walker or crutches.  If she is unable to ambulate at all she may require admission for pain control and PT.  Patient's presentation is most consistent with acute complicated illness / injury requiring diagnostic workup.  The patient is on the cardiac monitor to evaluate for evidence of arrhythmia and/or significant heart rate changes.   ----------------------------------------- 11:08 PM on 07/25/2023 -----------------------------------------  The patient is unable to ambulate.  She would also likely benefit from monitoring given the syncope.  I consulted and discussed the case with Dr. Sherlon from the hospitalist service who agreed to evaluate the patient for admission.   FINAL CLINICAL IMPRESSION(S) / ED DIAGNOSES   Final diagnoses:  Syncope, unspecified syncope type  Closed displaced fracture of fifth metatarsal bone of left foot, initial encounter     Rx / DC Orders   ED Discharge Orders     None        Note:  This document was prepared using Dragon voice recognition software and may  include unintentional dictation errors.    Cassis Waylon, MD 07/25/23 [phone]

## 2023-07-25 NOTE — ED Notes (Signed)
 Fall risk bracelet, non slip socks, and bed alarm placed at this time. Call light within reach.

## 2023-07-25 NOTE — ED Triage Notes (Signed)
 To ED POV for fall with dizziness today while getting into car. Then went to church, then could not walk out after church because could not bear weight on L ankle. States both ankles hurting. States still a little lightheaded. No CP, SOB.

## 2023-07-26 ENCOUNTER — Observation Stay

## 2023-07-26 ENCOUNTER — Telehealth: Payer: Self-pay | Admitting: "Endocrinology

## 2023-07-26 ENCOUNTER — Observation Stay
Admit: 2023-07-26 | Discharge: 2023-07-26 | Disposition: A | Discharge status: Home / Self Care | Attending: Internal Medicine

## 2023-07-26 DIAGNOSIS — K219 Gastro-esophageal reflux disease without esophagitis: Secondary | ICD-10-CM | POA: Diagnosis not present

## 2023-07-26 DIAGNOSIS — R55 Syncope and collapse: Secondary | ICD-10-CM | POA: Diagnosis not present

## 2023-07-26 DIAGNOSIS — I2510 Atherosclerotic heart disease of native coronary artery without angina pectoris: Secondary | ICD-10-CM | POA: Diagnosis not present

## 2023-07-26 DIAGNOSIS — I5032 Chronic diastolic (congestive) heart failure: Secondary | ICD-10-CM | POA: Diagnosis not present

## 2023-07-26 DIAGNOSIS — E1140 Type 2 diabetes mellitus with diabetic neuropathy, unspecified: Secondary | ICD-10-CM | POA: Diagnosis not present

## 2023-07-26 DIAGNOSIS — I70209 Unspecified atherosclerosis of native arteries of extremities, unspecified extremity: Secondary | ICD-10-CM | POA: Diagnosis not present

## 2023-07-26 DIAGNOSIS — R2681 Unsteadiness on feet: Secondary | ICD-10-CM | POA: Diagnosis not present

## 2023-07-26 DIAGNOSIS — I110 Hypertensive heart disease with heart failure: Secondary | ICD-10-CM | POA: Diagnosis not present

## 2023-07-26 DIAGNOSIS — Z794 Long term (current) use of insulin: Secondary | ICD-10-CM | POA: Diagnosis not present

## 2023-07-26 DIAGNOSIS — R54 Age-related physical debility: Secondary | ICD-10-CM | POA: Diagnosis not present

## 2023-07-26 DIAGNOSIS — E1165 Type 2 diabetes mellitus with hyperglycemia: Secondary | ICD-10-CM | POA: Diagnosis not present

## 2023-07-26 DIAGNOSIS — I69354 Hemiplegia and hemiparesis following cerebral infarction affecting left non-dominant side: Secondary | ICD-10-CM | POA: Diagnosis not present

## 2023-07-26 DIAGNOSIS — G894 Chronic pain syndrome: Secondary | ICD-10-CM | POA: Diagnosis not present

## 2023-07-26 DIAGNOSIS — J449 Chronic obstructive pulmonary disease, unspecified: Secondary | ICD-10-CM | POA: Diagnosis not present

## 2023-07-26 DIAGNOSIS — S92352A Displaced fracture of fifth metatarsal bone, left foot, initial encounter for closed fracture: Secondary | ICD-10-CM | POA: Diagnosis not present

## 2023-07-26 DIAGNOSIS — G4733 Obstructive sleep apnea (adult) (pediatric): Secondary | ICD-10-CM | POA: Diagnosis not present

## 2023-07-26 DIAGNOSIS — F17210 Nicotine dependence, cigarettes, uncomplicated: Secondary | ICD-10-CM | POA: Diagnosis not present

## 2023-07-26 LAB — COMPREHENSIVE METABOLIC PANEL WITH GFR
ALT: 13 U/L (ref 0–44)
AST: 18 U/L (ref 15–41)
Albumin: 3.2 g/dL — ABNORMAL LOW (ref 3.5–5.0)
Alkaline Phosphatase: 78 U/L (ref 38–126)
Anion gap: 10 (ref 5–15)
BUN: 21 mg/dL — ABNORMAL HIGH (ref 6–20)
CO2: 25 mmol/L (ref 22–32)
Calcium: 8.6 mg/dL — ABNORMAL LOW (ref 8.9–10.3)
Chloride: 103 mmol/L (ref 98–111)
Creatinine, Ser: 0.8 mg/dL (ref 0.44–1.00)
GFR, Estimated: >60 mL/min (ref >60)
Glucose, Bld: 242 mg/dL — ABNORMAL HIGH (ref 70–99)
Potassium: 3.7 mmol/L (ref 3.5–5.1)
Sodium: 138 mmol/L (ref 135–145)
Total Bilirubin: 0.4 mg/dL (ref 0.0–1.2)
Total Protein: 7.1 g/dL (ref 6.5–8.1)

## 2023-07-26 LAB — ECHOCARDIOGRAM COMPLETE
AR max vel: 3.94 cm2
AV Area VTI: 3.97 cm2
AV Area mean vel: 3.96 cm2
AV Mean grad: 3 mmHg
AV Peak grad: 4.5 mmHg
Ao pk vel: 1.06 m/s
Area-P 1/2: 3.56 cm2
Calc EF: 43 %
Height: 66 in
MV VTI: 2.95 cm2
S' Lateral: 2.04 cm
Single Plane A2C EF: 55.2 %
Single Plane A4C EF: 27.7 %
Weight: 3760 [oz_av]

## 2023-07-26 LAB — CBC
HCT: 38 % (ref 36.0–46.0)
Hemoglobin: 11.9 g/dL — ABNORMAL LOW (ref 12.0–15.0)
MCH: 28.5 pg (ref 26.0–34.0)
MCHC: 31.3 g/dL (ref 30.0–36.0)
MCV: 91.1 fL (ref 80.0–100.0)
Platelets: 269 K/uL (ref 150–400)
RBC: 4.17 MIL/uL (ref 3.87–5.11)
RDW: 14.4 % (ref 11.5–15.5)
WBC: 8.7 K/uL (ref 4.0–10.5)
nRBC: 0 % (ref 0.0–0.2)

## 2023-07-26 LAB — CBG MONITORING, ED
Glucose-Capillary: 218 mg/dL — ABNORMAL HIGH (ref 70–99)
Glucose-Capillary: 135 mg/dL — ABNORMAL HIGH (ref 70–99)
Glucose-Capillary: 163 mg/dL — ABNORMAL HIGH (ref 70–99)
Glucose-Capillary: 206 mg/dL — ABNORMAL HIGH (ref 70–99)

## 2023-07-26 LAB — BASIC METABOLIC PANEL WITH GFR
Anion gap: 10 (ref 5–15)
BUN: 16 mg/dL (ref 6–20)
CO2: 25 mmol/L (ref 22–32)
Calcium: 8.4 mg/dL — ABNORMAL LOW (ref 8.9–10.3)
Chloride: 102 mmol/L (ref 98–111)
Creatinine, Ser: 0.83 mg/dL (ref 0.44–1.00)
GFR, Estimated: >60 mL/min (ref >60)
Glucose, Bld: 180 mg/dL — ABNORMAL HIGH (ref 70–99)
Potassium: 3.7 mmol/L (ref 3.5–5.1)
Sodium: 137 mmol/L (ref 135–145)

## 2023-07-26 LAB — GLUCOSE, CAPILLARY
Glucose-Capillary: 291 mg/dL — ABNORMAL HIGH (ref 70–99)
Glucose-Capillary: 233 mg/dL — ABNORMAL HIGH (ref 70–99)

## 2023-07-26 LAB — D-DIMER, QUANTITATIVE: D-Dimer, Quant: 0.79 ug{FEU}/mL — ABNORMAL HIGH (ref 0.00–0.50)

## 2023-07-26 LAB — BRAIN NATRIURETIC PEPTIDE: B Natriuretic Peptide: 18.4 pg/mL (ref 0.0–100.0)

## 2023-07-26 LAB — HIV ANTIBODY (ROUTINE TESTING W REFLEX): HIV Screen 4th Generation wRfx: NONREACTIVE

## 2023-07-26 MED ORDER — INSULIN ASPART 100 UNIT/ML IJ SOLN
0.0000 [IU] | Freq: Every day | INTRAMUSCULAR | Status: DC
  Administered 2023-07-26 – 2023-07-28 (×4): 2 [IU] via SUBCUTANEOUS
  Administered 2023-07-29: 3 [IU] via SUBCUTANEOUS
  Filled 2023-07-26 (×6): qty 1

## 2023-07-26 MED ORDER — MIRABEGRON ER 50 MG PO TB24
50.0000 mg | ORAL_TABLET | Freq: Every day | ORAL | Status: DC
  Administered 2023-07-26 – 2023-07-30 (×5): 50 mg via ORAL
  Filled 2023-07-26 (×5): qty 1

## 2023-07-26 MED ORDER — FUROSEMIDE 20 MG PO TABS
20.0000 mg | ORAL_TABLET | Freq: Every day | ORAL | Status: DC
Start: 2023-07-26 — End: 2023-07-30
  Administered 2023-07-26 – 2023-07-30 (×5): 20 mg via ORAL
  Filled 2023-07-26 (×5): qty 1

## 2023-07-26 MED ORDER — HYDRALAZINE HCL 20 MG/ML IJ SOLN: 5.0000 mg | INTRAMUSCULAR | Status: DC | PRN

## 2023-07-26 MED ORDER — CLOPIDOGREL BISULFATE 75 MG PO TABS
75.0000 mg | ORAL_TABLET | Freq: Every day | ORAL | Status: DC
  Administered 2023-07-26 – 2023-07-30 (×5): 75 mg via ORAL
  Filled 2023-07-26 (×5): qty 1

## 2023-07-26 MED ORDER — ACETAMINOPHEN 325 MG PO TABS
650.0000 mg | ORAL_TABLET | Freq: Four times a day (QID) | ORAL | Status: DC | PRN
  Administered 2023-07-26 (×2): 650 mg via ORAL
  Filled 2023-07-26 (×2): qty 2

## 2023-07-26 MED ORDER — ALBUTEROL SULFATE (2.5 MG/3ML) 0.083% IN NEBU: 2.5000 mg | INHALATION_SOLUTION | RESPIRATORY_TRACT | Status: DC | PRN

## 2023-07-26 MED ORDER — GABAPENTIN 300 MG PO CAPS
600.0000 mg | ORAL_CAPSULE | Freq: Every day | ORAL | Status: DC
  Administered 2023-07-26 – 2023-07-30 (×5): 600 mg via ORAL
  Filled 2023-07-26 (×5): qty 2

## 2023-07-26 MED ORDER — INSULIN ASPART 100 UNIT/ML IJ SOLN
0.0000 [IU] | Freq: Three times a day (TID) | INTRAMUSCULAR | Status: DC
  Administered 2023-07-26: 2 [IU] via SUBCUTANEOUS
  Administered 2023-07-26: 3 [IU] via SUBCUTANEOUS
  Administered 2023-07-26: 5 [IU] via SUBCUTANEOUS
  Administered 2023-07-27: 8 [IU] via SUBCUTANEOUS
  Administered 2023-07-27: 3 [IU] via SUBCUTANEOUS
  Administered 2023-07-27: 8 [IU] via SUBCUTANEOUS
  Administered 2023-07-28 (×2): 3 [IU] via SUBCUTANEOUS
  Administered 2023-07-28: 5 [IU] via SUBCUTANEOUS
  Administered 2023-07-29: 8 [IU] via SUBCUTANEOUS
  Administered 2023-07-29 (×2): 5 [IU] via SUBCUTANEOUS
  Administered 2023-07-30: 11 [IU] via SUBCUTANEOUS
  Filled 2023-07-26 (×13): qty 1

## 2023-07-26 MED ORDER — KETOROLAC TROMETHAMINE 15 MG/ML IJ SOLN
15.0000 mg | Freq: Four times a day (QID) | INTRAMUSCULAR | Status: DC | PRN
  Administered 2023-07-26: 15 mg via INTRAVENOUS
  Filled 2023-07-26: qty 1

## 2023-07-26 MED ORDER — ENOXAPARIN SODIUM 60 MG/0.6ML IJ SOSY
0.5000 mg/kg | PREFILLED_SYRINGE | INTRAMUSCULAR | Status: DC
  Administered 2023-07-26 – 2023-07-30 (×5): 52.5 mg via SUBCUTANEOUS
  Filled 2023-07-26 (×5): qty 0.6

## 2023-07-26 MED ORDER — AMLODIPINE BESYLATE 10 MG PO TABS
10.0000 mg | ORAL_TABLET | Freq: Every day | ORAL | Status: DC
  Administered 2023-07-26 – 2023-07-30 (×5): 10 mg via ORAL
  Filled 2023-07-26 (×2): qty 1
  Filled 2023-07-26: qty 2
  Filled 2023-07-26 (×2): qty 1

## 2023-07-26 MED ORDER — EZETIMIBE 10 MG PO TABS
10.0000 mg | ORAL_TABLET | Freq: Every day | ORAL | Status: DC
  Administered 2023-07-26 – 2023-07-30 (×5): 10 mg via ORAL
  Filled 2023-07-26 (×5): qty 1

## 2023-07-26 MED ORDER — INSULIN GLARGINE-YFGN 100 UNIT/ML ~~LOC~~ SOLN
40.0000 [IU] | Freq: Every day | SUBCUTANEOUS | Status: DC
Start: 2023-07-26 — End: 2023-07-29
  Administered 2023-07-26 – 2023-07-28 (×4): 40 [IU] via SUBCUTANEOUS
  Filled 2023-07-26 (×6): qty 0.4

## 2023-07-26 MED ORDER — BUPRENORPHINE 7.5 MCG/HR TD PTWK: 1.0000 | MEDICATED_PATCH | TRANSDERMAL | Status: DC

## 2023-07-26 MED ORDER — ROSUVASTATIN CALCIUM 20 MG PO TABS
40.0000 mg | ORAL_TABLET | Freq: Every day | ORAL | Status: DC
  Administered 2023-07-26 – 2023-07-30 (×5): 40 mg via ORAL
  Filled 2023-07-26 (×5): qty 2

## 2023-07-26 MED ORDER — VITAMIN D 25 MCG (1000 UNIT) PO TABS
1000.0000 [IU] | ORAL_TABLET | Freq: Every day | ORAL | Status: DC
  Administered 2023-07-26 – 2023-07-30 (×5): 1000 [IU] via ORAL
  Filled 2023-07-26 (×5): qty 1

## 2023-07-26 MED ORDER — HYDRALAZINE HCL 50 MG PO TABS
50.0000 mg | ORAL_TABLET | Freq: Three times a day (TID) | ORAL | Status: DC
  Administered 2023-07-26 – 2023-07-30 (×13): 50 mg via ORAL
  Filled 2023-07-26 (×13): qty 1

## 2023-07-26 MED ORDER — CARVEDILOL 25 MG PO TABS
25.0000 mg | ORAL_TABLET | Freq: Two times a day (BID) | ORAL | Status: DC
  Administered 2023-07-26 – 2023-07-30 (×9): 25 mg via ORAL
  Filled 2023-07-26 (×6): qty 1
  Filled 2023-07-26: qty 4
  Filled 2023-07-26 (×2): qty 1

## 2023-07-26 MED ORDER — CYCLOSPORINE 0.05 % OP EMUL
2.0000 [drp] | Freq: Two times a day (BID) | OPHTHALMIC | Status: DC
  Administered 2023-07-26 – 2023-07-30 (×9): 2 [drp] via OPHTHALMIC
  Filled 2023-07-26 (×9): qty 30

## 2023-07-26 MED ORDER — GABAPENTIN 400 MG PO CAPS
1200.0000 mg | ORAL_CAPSULE | Freq: Every day | ORAL | Status: DC
  Administered 2023-07-26 – 2023-07-29 (×4): 1200 mg via ORAL
  Filled 2023-07-26 (×4): qty 3

## 2023-07-26 MED ORDER — OXYCODONE HCL 5 MG PO TABS
5.0000 mg | ORAL_TABLET | ORAL | Status: DC | PRN
  Administered 2023-07-26 – 2023-07-29 (×14): 5 mg via ORAL
  Filled 2023-07-26 (×14): qty 1

## 2023-07-26 MED ORDER — PANTOPRAZOLE SODIUM 40 MG PO TBEC
40.0000 mg | DELAYED_RELEASE_TABLET | Freq: Every day | ORAL | Status: DC
  Administered 2023-07-26 – 2023-07-30 (×5): 40 mg via ORAL
  Filled 2023-07-26 (×5): qty 1

## 2023-07-26 MED ORDER — AMITRIPTYLINE HCL 25 MG PO TABS
50.0000 mg | ORAL_TABLET | Freq: Every day | ORAL | Status: DC
  Administered 2023-07-26 – 2023-07-29 (×5): 50 mg via ORAL
  Filled 2023-07-26 (×4): qty 2
  Filled 2023-07-26: qty 1

## 2023-07-26 MED ORDER — LEVETIRACETAM 500 MG PO TABS
500.0000 mg | ORAL_TABLET | Freq: Every day | ORAL | Status: DC
  Administered 2023-07-26 – 2023-07-30 (×5): 500 mg via ORAL
  Filled 2023-07-26 (×5): qty 1

## 2023-07-26 MED ORDER — BUTALBITAL-APAP-CAFFEINE 50-325-40 MG PO TABS
1.0000 | ORAL_TABLET | Freq: Four times a day (QID) | ORAL | Status: DC | PRN
  Administered 2023-07-26: 1 via ORAL
  Filled 2023-07-26: qty 1

## 2023-07-26 MED ORDER — ACETAMINOPHEN 650 MG RE SUPP: 650.0000 mg | Freq: Four times a day (QID) | RECTAL | Status: DC | PRN

## 2023-07-26 MED ORDER — LEVETIRACETAM 750 MG PO TABS
750.0000 mg | ORAL_TABLET | Freq: Every day | ORAL | Status: DC
  Administered 2023-07-26 – 2023-07-29 (×5): 750 mg via ORAL
  Filled 2023-07-26 (×5): qty 1

## 2023-07-26 MED ORDER — BUPRENORPHINE 7.5 MCG/HR TD PTWK
1.0000 | MEDICATED_PATCH | TRANSDERMAL | Status: DC
  Administered 2023-07-26: 1 via TRANSDERMAL
  Filled 2023-07-26: qty 1

## 2023-07-26 NOTE — Consult Note (Signed)
 ORTHOPAEDIC CONSULTATION  REQUESTING PHYSICIAN: Caleen Qualia, MD  Chief Complaint: Left fifth metatarsal fracture  HPI: [name] is a 58 y.o. female who complains of left foot pain from fall.  X-rays performed that shows a nondisplaced fifth metatarsal fracture on the left foot.  Past Medical History:  Diagnosis Date   Anxiety and depression 09/13/2006   Qualifier: Diagnosis of  By: Gladis [name], Nykedtra     Arthritis    joint pain    Asthma    CHF (congestive heart failure) (HCC)    COPD (chronic obstructive pulmonary disease) (HCC)    chronic bronchitis    Depression    Diabetes mellitus    since age 8; type 2 IDDM   Diabetic neuropathy, painful (HCC)    FEET AND HANDS   Diabetic retinopathy (HCC)    Diastolic dysfunction    a.  Echo 11/17: EF 60-65%, mild LVH, no RWMA, Gr1DD, mild AI, dilated aortic root measuring 38 mm, mildly dilated ascending aorta   Dilated aortic root (HCC)    38mm by echo 11/2015   Dizziness    secondary to diabetes and hypertension    Epilepsy idiopathic petit mal (HCC)    last seizure 2012;controlled w/ topomax   GERD (gastroesophageal reflux disease)    Glaucoma    NOT ON ANY EYE DROPS    Headache(784.0)    migraines    Heart murmur    born with    History of stress test    a. 11/17: Normal perfusion, EF 53%, normal study   Hx of blood clots    hematomas removed from left side of brain from 11mos to 58yrs old    Hyperlipidemia    Hypertension    Legally blind    left eye    MIGRAINE HEADACHE 09/14/2006   Qualifier: Diagnosis of  By: Gladis [name], Nykedtra     Myocardial infarction [facility])    a.  Patient reported history of without objective documentation   Nephrolithiasis    frequent urination , urination at nite  PT SEEN IN ER 09/22/11 FOR BACK AND RT SIDED PAIN--HAS KNOWN STONE RT URETER AND UA IN ER SHOWED UTI   Pain 09/23/2011   LOWER BACK AND RIGHT SIDE--PT HAS RIGHT URETERAL STONE   Pneumonia    hx of 2009    Pyelonephritis    SBO (small bowel obstruction) (HCC) 04/23/2021   Seizures (HCC)    Sleep apnea    [facility];does not use Cpap ; mild   Small bowel obstruction (HCC) 05/18/2021   Stress incontinence    Stroke (HCC)    last 2003  RESIDUAL LEFT LEG WEAKNESS--NO OTHER RESIDUAL PROBLEMS   Past Surgical History:  Procedure Laterality Date   BRAIN HEMATOMA EVACUATION     five procedures total, first procedure when 44 months old, last at 58 years of age.   CYSTOSCOPY W/ RETROGRADES  09/24/2011   Procedure: CYSTOSCOPY WITH RETROGRADE PYELOGRAM;  Surgeon: Norleen JINNY Seltzer, MD;  Location: [facility];  Service: Urology;  Laterality: Right;   CYSTOSCOPY WITH URETEROSCOPY  09/24/2011   Procedure: CYSTOSCOPY WITH URETEROSCOPY;  Surgeon: Norleen JINNY Seltzer, MD;  Location: [facility];  Service: Urology;  Laterality: Right;  Balloon dilation right ureter    CYSTOSCOPY/RETROGRADE/URETEROSCOPY  08/24/2011   Procedure: CYSTOSCOPY/RETROGRADE/URETEROSCOPY;  Surgeon: Toribio Neysa Repine, MD;  Location: [facility];  Service: Urology;  Laterality: Right;  Cysto, Right retrograde Pyelogram, right stent placement.    INCISIONAL HERNIA REPAIR  05/05/2011   Procedure: LAPAROSCOPIC INCISIONAL HERNIA;  Surgeon: Donnice Bury, MD;  Location: [facility] OR;  Service: General;  Laterality: N/A;   kidney stone removal     LAPAROTOMY N/A 06/12/2021   Procedure: EXPLORATORY LAPAROTOMY, LYSIS OF ADHESIONS;  Surgeon: Desiderio Schanz, MD;  Location: [facility] ORS;  Service: General;  Laterality: N/A;   MASS EXCISION  05/05/2011   Procedure: EXCISION MASS;  Surgeon: Donnice Bury, MD;  Location: [facility] OR;  Service: General;  Laterality: Right;   [facility]     RETINAL DETACHMENT SURGERY Left 1990   RIGHT/LEFT HEART CATH AND CORONARY ANGIOGRAPHY N/A 04/28/2019   Procedure: RIGHT/LEFT HEART CATH AND CORONARY ANGIOGRAPHY;  Surgeon: Florencio Cara BIRCH, MD;  Location: [facility] INVASIVE CV LAB;  Service: Cardiovascular;  Laterality: N/A;   SHUNT REMOVAL      shunt inserted at age 68 removed at age 63    VAGINAL HYSTERECTOMY  23   Social History   Socioeconomic History   Marital status: Widowed    Spouse name: Not on file   Number of children: 2   Years of education: Not on file   Highest education level: 12th grade  Occupational History   Occupation: disabled  Tobacco Use   Smoking status: Former    Current packs/day: 0.00    Average packs/day: 1 pack/day for 15.0 years (15.0 ttl pk-yrs)    Types: Cigarettes    Start date: 01/20/1972    Quit date: 01/20/1987    Years since quitting: 36.5   Smokeless tobacco: Never   Tobacco comments:    quit more than 20 years  Vaping Use   Vaping status: Never Used  Substance and Sexual Activity   Alcohol  use: Not Currently    Alcohol /week: 1.0 standard drink of alcohol     Types: 1 Glasses of wine per week    Comment: occ   Drug use: No    Comment: hx of marijuana use no longer uses    Sexual activity: Yes    Partners: Male    Birth control/protection: Surgical  Other Topics Concern   Not on file  Social History Narrative   Pt lives with her daughter   Social Drivers of Health   Financial Resource Strain: Low Risk  (04/01/2023)   Overall Financial Resource Strain (CARDIA)    Difficulty of Paying Living Expenses: Not hard at all  Food Insecurity: No Food Insecurity (04/26/2023)   Hunger Vital Sign    Worried About Running Out of Food in the Last Year: Never true    Ran Out of Food in the Last Year: Never true  Transportation Needs: No Transportation Needs (04/26/2023)   PRAPARE - Administrator, Civil Service (Medical): No    Lack of Transportation (Non-Medical): No  Physical Activity: Insufficiently Active (04/01/2023)   Exercise Vital Sign    Days of Exercise per Week: 2 days    Minutes of Exercise per Session: 10 min  Stress: No Stress Concern Present (04/01/2023)   [employer] of Occupational Health - Occupational Stress Questionnaire    Feeling of Stress : Not at  all  Social Connections: Moderately Isolated (04/01/2023)   Social Connection and Isolation Panel    Frequency of Communication with Friends and Family: More than three times a week    Frequency of Social Gatherings with Friends and Family: Once a week    Attends Religious Services: More than 4 times per year    Active Member of Clubs or Organizations: No  Attends Banker Meetings: Never    Marital Status: Widowed   Family History  Problem Relation Age of Onset   Uterine cancer Mother    Hypertension Mother    Cancer Mother    Brain cancer Maternal Grandmother    Hypertension Maternal Grandmother    Birth defects Daughter    Hypertension Daughter    Cirrhosis Maternal Grandfather    ADD / ADHD Maternal Grandfather    Birth defects Maternal Grandfather    Diabetes Maternal Grandfather    Anesthesia problems Neg Hx    Colon cancer Neg Hx    Esophageal cancer Neg Hx    Pancreatic cancer Neg Hx    Allergies  Allergen Reactions   Codeine Swelling and Other (See Comments)    Swelling and burning of mouth (inside)   Farxiga  [Dapagliflozin ] Other (See Comments)    Recurrent UTI with farxiga    Lactose Intolerance (Gi) Nausea And Vomiting   Prior to Admission medications   Medication Sig Start Date End Date Taking? Authorizing Provider  albuterol  (ACCUNEB ) 1.25 MG/3ML nebulizer solution USE 1 VIAL VIA NEBULIZER TWICE  DAILY 05/12/23  Yes [name], [name], [name]  albuterol  (VENTOLIN  HFA) 108 (90 Base) MCG/ACT inhaler USE 1 TO 2 INHALATIONS BY MOUTH  EVERY 6 HOURS AS NEEDED 05/31/23  Yes [name], [name], [name]  amitriptyline  (ELAVIL ) 25 MG tablet Take 2-3 tablets (50-75 mg total) by mouth at bedtime. 06/30/23 12/27/23 Yes [name], [name], NP  amLODipine  (NORVASC ) 10 MG tablet TAKE 1 TABLET BY MOUTH DAILY 10/01/22  Yes [name], [name], [name]  buprenorphine  (BUTRANS ) 7.5 MCG/HR Place 1 patch onto the skin once a week. 05/13/23 08/05/23 Yes [name], [name], NP  butalbital -acetaminophen -caffeine   (FIORICET ) 50-325-40 MG tablet Take 1-2 tablets by mouth every 6 (six) hours as needed for headache. 04/26/23 04/25/24 Yes Marcelino Nurse, MD  carvedilol  (COREG ) 25 MG tablet Take 25 mg by mouth 2 (two) times daily. 03/06/21  Yes [provider]  cholecalciferol  (VITAMIN D3) 25 MCG (1000 UNIT) tablet Take 1 tablet (1,000 Units total) by mouth daily. 11/02/22  Yes [name], [name], [name]  clopidogrel  (PLAVIX ) 75 MG tablet Take 1 tablet (75 mg total) by mouth daily. 10/10/19  Yes [name], MD  cycloSPORINE  (RESTASIS ) 0.05 % ophthalmic emulsion Place 2 drops into both eyes 2 (two) times daily.   Yes [provider]  Dulaglutide  (TRULICITY ) 4.5 MG/0.5ML SOAJ Inject 4.5 mg as directed once a week. 04/22/23  Yes [name], [name], MD  DULoxetine  (CYMBALTA ) 60 MG capsule TAKE 1 CAPSULE BY MOUTH EVERY DAY 10/26/22  Yes [name], [name], [name]  EPINEPHrine  0.3 mg/0.3 mL IJ SOAJ injection INJECT 1 PEN INTRAMUSCULARLY AS  NEEDED FOR ALLERGIC RESPONSE AS  DIRECTED BY MD. [facility]  HELP AFTER USE 05/21/23  Yes [name], [name], [name]  ezetimibe  (ZETIA ) 10 MG tablet TAKE 1 TABLET BY MOUTH DAILY 05/26/23  Yes [name], [name], [name]  furosemide  (LASIX ) 20 MG tablet TAKE 1 TABLET BY MOUTH DAILY 10/01/22  Yes [name], [name], [name]  gabapentin  (NEURONTIN ) 600 MG tablet 600 mg qAM, 600 mg qPM, 1200 mg QHS 07/12/23  Yes [name], [name], NP  Glucagon  (GVOKE HYPOPEN  2-PACK) 1 MG/0.2ML SOAJ INJECT SUB-Q 1MG  ONCE AS NEEDED  FOR UP TO 1 DOSE. ONLY FOR  SEVERE LOW BLOOD SUGAR NOT  RESPONDING TO DRINKING  JUICE/GLUCOSE TABS, WHEN CBG &lt;70 05/18/23  Yes [name], [name], MD  hydrALAZINE  (APRESOLINE ) 50 MG tablet Take 1 tablet by mouth 3 (three) times daily. 05/22/22  Yes [provider]  insulin  glargine, 2 Unit Dial , (TOUJEO  MAX SOLOSTAR) 300 UNIT/ML Solostar Pen Inject 50 Units into the skin at bedtime. Plus sliding scale, max dose 80 units a day 06/15/23 09/13/23 Yes [name], [name], MD  insulin  lispro (HUMALOG  KWIKPEN) 100  UNIT/ML KwikPen Inject 15 Units into the skin 3 (three) times daily. Plus Sliding scale, max dose 80 units a day 06/15/23 09/13/23 Yes [name], [name], MD  levETIRAcetam  (KEPPRA ) 500 MG tablet Take 500-750 mg by mouth See admin instructions. Take 1 tablet (500mg ) by mouth every morning and take 1 tablets by mouth (750mg ) by mouth every night   Yes [provider]  meclizine  (ANTIVERT ) 25 MG tablet Take 1 tablet (25 mg total) by mouth 3 (three) times daily as needed for dizziness. 06/16/22  Yes Ray, Nilsa, MD  mirabegron  ER (MYRBETRIQ ) 50 MG TB24 tablet TAKE 1 TABLET BY MOUTH DAILY 05/26/23  Yes [name], [name], [name]  nitroGLYCERIN  (NITROSTAT ) 0.4 MG SL tablet Place 0.4 mg under the tongue every 5 (five) minutes as needed for chest pain. 06/10/23  Yes [provider]  pantoprazole  (PROTONIX ) 40 MG tablet Take 1 tablet (40 mg total) by mouth daily as needed. 03/15/23  Yes [name], [name], [name]  polyethylene glycol (MIRALAX  / GLYCOLAX ) 17 g packet Take 17 g by mouth daily as needed for mild constipation. 10/21/21  Yes Lama, Sabas RAMAN, MD  rosuvastatin  (CRESTOR ) 40 MG tablet TAKE 1 TABLET BY MOUTH DAILY 11/19/22  Yes [name], [name], MD  senna-docusate (SENOKOT-S) 8.6-50 MG tablet Take 1 tablet by mouth at bedtime as needed for mild constipation. 10/21/21  Yes Drusilla Sabas RAMAN, MD  tiZANidine  (ZANAFLEX ) 4 MG tablet Take 1 tablet (4 mg total) by mouth every 8 (eight) hours as needed for muscle spasms. 02/18/23  Yes Marcelino Nurse, MD  Ubrogepant  (UBRELVY ) 50 MG TABS 50 mg or 100 mg taken orally with or without food. If needed, a second dose may be taken at least 2 hours after the initial dose. The maximum dose in a 24-hour period is 200 mg 02/18/23  Yes Lateef, Nurse, MD  WIXELA INHUB 250-50 MCG/DOSE AEPB Inhale 1 puff into the lungs 2 (two) times daily. 01/22/20  Yes [provider]  ACCU-CHEK GUIDE TEST test strip USE 1 STRIP IN THE MORNING, AT  NOON, AND AT BEDTIME 03/31/23   [name], [name], MD  Continuous  Glucose Sensor (FREESTYLE LIBRE 3 PLUS SENSOR) MISC Inject 1 Device into the skin continuous. Change every 14 days 01/14/23   Dartha Ernst, MD  Continuous Glucose Sensor (FREESTYLE LIBRE 3 SENSOR) MISC 1 Device by Does not apply route continuous. 09/25/22   [name], [name], MD  Insulin  Pen Needle (PEN NEEDLES) 32G X 4 MM MISC 1 Device by Does not apply route in the morning, at noon, in the evening, and at bedtime. 04/19/23   [name], [name], MD   CT HEAD WO CONTRAST ( ) Result Date: 07/25/2023 CLINICAL DATA:  Syncope EXAM: CT HEAD WITHOUT CONTRAST TECHNIQUE: Contiguous axial images were obtained from the base of the skull through the vertex without intravenous contrast. RADIATION DOSE REDUCTION: This exam was performed according to the departmental dose-optimization program which includes automated exposure control, adjustment of the mA and/or kV according to patient size and/or use of iterative reconstruction technique. COMPARISON:  06/16/2023 FINDINGS: Brain: Mild cerebral atrophy. No acute intracranial abnormality. Specifically, no hemorrhage, hydrocephalus, mass lesion, acute infarction, or significant intracranial injury. Densely calcified falx, unchanged. Vascular: No hyperdense vessel or unexpected calcification. Skull: No  acute calvarial abnormality. Sinuses/Orbits: No acute findings Other: None IMPRESSION: Cerebral atrophy. No acute intracranial abnormality. Electronically Signed   By: Franky Crease M.D.   On: 07/25/2023 23:29   DG Foot Complete Left Result Date: 07/25/2023 CLINICAL DATA:  Mid foot pain after fall EXAM: LEFT FOOT - COMPLETE 3+ VIEW COMPARISON:  None Available. FINDINGS: Acute mildly displaced fracture at the base of the fifth metatarsal. Tiny plantar calcaneal spur. IMPRESSION: Acute mildly displaced fracture at the base of the fifth metatarsal. Electronically Signed   By: Luke Bun M.D.   On: 07/25/2023 21:02   DG Foot Complete Right Result Date: 07/25/2023 CLINICAL DATA:  Mid  foot pain after fall EXAM: RIGHT FOOT COMPLETE - 3+ VIEW COMPARISON:  None Available. FINDINGS: There is no evidence of fracture or dislocation. There is no evidence of arthropathy or other focal bone abnormality. Soft tissues are unremarkable. IMPRESSION: Negative. Electronically Signed   By: Luke Bun M.D.   On: 07/25/2023 21:01   DG Ankle Complete Left Result Date: 07/25/2023 CLINICAL DATA:  Fall, pain EXAM: LEFT ANKLE COMPLETE - 3+ VIEW COMPARISON:  None Available. FINDINGS: No acute bony abnormality. Specifically, no fracture, subluxation, or dislocation. Joint spaces maintained. Mild diffuse soft tissue swelling. IMPRESSION: No acute bony abnormality. Electronically Signed   By: Franky Crease M.D.   On: 07/25/2023 19:41   DG Ankle Complete Right Result Date: 07/25/2023 CLINICAL DATA:  Fall, pain EXAM: RIGHT ANKLE - COMPLETE 3+ VIEW COMPARISON:  None Available. FINDINGS: No acute bony abnormality. Specifically, no fracture, subluxation, or dislocation. Mild lateral soft tissue swelling. IMPRESSION: No acute bony abnormality. Electronically Signed   By: Franky Crease M.D.   On: 07/25/2023 19:40   DG Chest 2 View Result Date: 07/25/2023 CLINICAL DATA:  Near syncope EXAM: CHEST - 2 VIEW COMPARISON:  06/12/2023 FINDINGS: Heart and mediastinal contours are within normal limits. No focal opacities or effusions. No acute bony abnormality. IMPRESSION: No active cardiopulmonary disease. Electronically Signed   By: Franky Crease M.D.   On: 07/25/2023 19:39    Positive ROS: All other systems have been reviewed and were otherwise negative with the exception of those mentioned in the HPI and as above.  12 point ROS was performed.  Physical Exam: Evaluation was performed remotely.  I reviewed the x-rays that shows a small nondisplaced fifth metatarsal base avulsion fracture.  Ankle x-rays were negative.  No gapping of the syndesmosis. Assessment: Nondisplaced fifth metatarsal base avulsion  fracture  Plan: Recommend weightbearing as tolerated in postop shoe.  Can wrap foot with Ace wrap.  May need crutches or walker to assist with weightbearing as tolerated.  She can follow-up in the outpatient clinic in 3 weeks.    Ashley Eva LABOR, DPM Cell [phone]   07/26/2023 11:33 AM

## 2023-07-26 NOTE — Progress Notes (Signed)
 No charge progress note.   [name]  is a 58 y.o. female,  with history of previous abdominal surgery, previous SBO, obesity, coronary artery disease, GERD, heart failure preserved ejection fraction, uncontrolled type II diabetes mellitus, insulin -dependent type II diabetes mellitus presented to ED after having a syncopal episode resulted in fall causing pain in bilateral feet and ankle, unable to bear weight on left ankle.  Patient felt lightheadedness while walking towards her car and then passed out.  On presentation stable vitals, labs with glucose of 313, leukocytosis at 12.2. CT head with no acute abnormality.  Lower extremity imaging with left foot acute mildly displaced fracture at the base of fifth metatarsal, patient was given cam boot in ED.  7/7: Vitals with mildly elevated blood pressure, labs stable, having significant pain in left foot at fracture site.  Echocardiogram ordered to complete the syncopal workup. Imaging of R fibula was obtained as she was complaining of lower leg pain and it was negative for any acute fractures. Patient continued to complain about significant pain and unable to bear weight-PT and OT are recommending SNF.  Patient is medically stable for discharge.  Podiatry saw her and they are recommending weightbearing as tolerated, use cam boot with ambulation and outpatient follow-up in 2 to 3 weeks.  On exam patient is morbidly obese lady, rest of the physical exam was benign except  some subjective tenderness involving left foot and lower leg.  - Will continue current management. - TOC was consulted to find her a the rehab place - Toradol  was added as she was still complaining of a lot of pain.  Will avoid more sedating medications

## 2023-07-26 NOTE — Assessment & Plan Note (Signed)
 Symptoms preceded by dizziness and lightheadedness, orthostatic vitals were negative in ED.  Likely due to polypharmacy. CT head with no acute abnormality and EKG with no acute changes. Patient has an history of CVA with some residual left-sided weakness. Echocardiogram with normal EF and grade 1 diastolic dysfunction.  No other significant abnormality. -Continue with supportive care

## 2023-07-26 NOTE — Progress Notes (Signed)
 Anticoagulation monitoring(Lovenox ):  58 yo female ordered Lovenox  40 mg Q24h    Filed Weights   07/25/23 1846  Weight: 106.6 kg (235 lb)   BMI 37.9    Lab Results  Component Value Date   CREATININE 0.93 07/25/2023   CREATININE 0.83 06/12/2023   CREATININE 0.96 06/10/2023   Estimated Creatinine Clearance: 81.4 mL/min (by C-G formula based on SCr of 0.93 mg/dL). Hemoglobin & Hematocrit     Component Value Date/Time   HGB 13.0 07/25/2023 1846   HGB 13.1 05/08/2015 1106   HCT 41.4 07/25/2023 1846   HCT 39.5 05/08/2015 1106     Per Protocol for Patient with estCrcl > 30 ml/min and BMI > 30, will transition to Lovenox  52.5 mg Q24h.

## 2023-07-26 NOTE — Telephone Encounter (Signed)
 Patient called this morning,she had an appointment on 06/22/23 and had to cancel.She also had an appointment on 07/27/23 but is unable to come due to lack of transportation but also broke her foot.Patient is asking if her next visit can be a MyChart visit if possible.The patient also asked if her meds could be refilled.Her contact info is [phone]

## 2023-07-26 NOTE — [place] FL2 (Signed)
 [place]  MEDICAID [id number] LEVEL OF CARE FORM     IDENTIFICATION  Patient Name: [name] Birthdate: [birth year] Sex: female Admission Date (Current Location): 07/25/2023  [facility] and [place] Number:  Chiropodist and Address:  [facility], [address] [place], [place] [address]      Provider Number: [id number]  Attending Physician Name and Address:  Caleen Qualia, MD  Relative Name and Phone Number:  Sharan Bunker   [phone]    Current Level of Care: Hospital Recommended Level of Care: Skilled Nursing Facility Prior Approval Number:    Date Approved/Denied: 07/26/23 PASRR Number: [id number]  Discharge Plan: SNF    Current Diagnoses: Patient Active Problem List   Diagnosis Date Noted   Near syncope 07/25/2023   Medication management 05/13/2023   Class 2 severe obesity with serious comorbidity and body mass index (BMI) of 39.0 to 39.9 in adult [facility]) 03/15/2023   Uncontrolled type 2 diabetes mellitus with hyperglycemia (HCC) 11/17/2021   Mild episode of recurrent major depressive disorder (HCC) 08/14/2021   Hypertensive urgency 04/23/2021   GERD without esophagitis 04/23/2021   Chronic diastolic CHF (congestive heart failure) (HCC) 04/23/2021   Cervical spondylosis 02/12/2021   Lumbar facet arthropathy 02/12/2021   Major depressive disorder, recurrent episode, moderate (HCC) 11/13/2020   Adrenal adenoma, right    PAD (peripheral artery disease) (HCC) 01/24/2020   CAD (coronary artery disease) 04/27/2019   Vitamin D  deficiency 01/24/2018   Overactive bladder 05/18/2017   Dilated aortic root (HCC)    Sacroiliac dysfunction 10/09/2014   Chronic low back pain 09/03/2014   Hemiparesis affecting left side as late effect of stroke (HCC) 09/03/2014   OSA (obstructive sleep apnea) 05/15/2014   Dyslipidemia associated with type 2 diabetes mellitus (HCC) 01/21/2014   Essential hypertension, benign 01/21/2014   Chronic  pain syndrome 01/21/2014   Headache disorder 09/07/2013   Mixed urge and stress incontinence 08/25/2011   BLINDNESS, LEGAL, [place]  DEFINITION 09/14/2006   Seizure (HCC) 09/13/2006    Orientation RESPIRATION BLADDER Height & Weight     Self, Time, Situation, Place  Other (Comment) (CPAP at night) Continent Weight: 106.6 kg Height:  5' 6 (167.6 cm)  BEHAVIORAL SYMPTOMS/MOOD NEUROLOGICAL BOWEL NUTRITION STATUS  Other (Comment) (appropriate)   Continent Diet (heart health, low sodium)  AMBULATORY STATUS COMMUNICATION OF NEEDS Skin   Limited Assist                           Personal Care Assistance Level of Assistance  Bathing, Feeding, Dressing Bathing Assistance: Limited assistance Feeding assistance: Independent Dressing Assistance: Limited assistance     Functional Limitations Info  Sight, Hearing, Speech Sight Info: Adequate Hearing Info: Adequate Speech Info: Adequate    SPECIAL CARE FACTORS FREQUENCY  PT (By licensed PT), OT (By licensed OT)     PT Frequency: 5x a week OT Frequency: 5x a week            Contractures Contractures Info: Not present    Additional Factors Info  Code Status, Allergies (Full code,) Code Status Info: Full code Allergies Info: Codeine, Lactose Intolerance, Farxiga            Current Medications (07/26/2023):  This is the current hospital active medication list Current Facility-Administered Medications  Medication Dose Route Frequency Provider Last Rate Last Admin   acetaminophen  (TYLENOL ) tablet 650 mg  650 mg Oral Q6H PRN Elgergawy, Dawood S, MD   650 mg at 07/26/23  1426   Or   acetaminophen  (TYLENOL ) suppository 650 mg  650 mg Rectal Q6H PRN Elgergawy, Dawood S, MD       albuterol  (PROVENTIL ) (2.5 MG/3ML) 0.083% nebulizer solution 2.5 mg  2.5 mg Nebulization Q2H PRN Elgergawy, Dawood S, MD       amitriptyline  (ELAVIL ) tablet 50 mg  50 mg Oral QHS Elgergawy, Dawood S, MD   50 mg at 07/26/23 0248   amLODipine  (NORVASC ) tablet 10  mg  10 mg Oral Daily Elgergawy, Dawood S, MD   10 mg at 07/26/23 1051   [START ON 07/31/2023] buprenorphine  (BUTRANS ) 7.5 MCG/HR 1 patch  1 patch Transdermal Weekly Elgergawy, Brayton RAMAN, MD       carvedilol  (COREG ) tablet 25 mg  25 mg Oral BID WC Elgergawy, Dawood S, MD   25 mg at 07/26/23 9167   cholecalciferol  (VITAMIN D3) 25 MCG (1000 UNIT) tablet 1,000 Units  1,000 Units Oral Daily Elgergawy, Dawood S, MD   1,000 Units at 07/26/23 0831   clopidogrel  (PLAVIX ) tablet 75 mg  75 mg Oral Daily Elgergawy, Dawood S, MD   75 mg at 07/26/23 0831   cycloSPORINE  (RESTASIS ) 0.05 % ophthalmic emulsion 2 drop  2 drop Both Eyes BID Elgergawy, Dawood S, MD   2 drop at 07/26/23 9165   enoxaparin  (LOVENOX ) injection 52.5 mg  0.5 mg/kg Subcutaneous Q24H Elgergawy, Dawood S, MD   52.5 mg at 07/26/23 9167   ezetimibe  (ZETIA ) tablet 10 mg  10 mg Oral Daily Elgergawy, Dawood S, MD   10 mg at 07/26/23 9167   furosemide  (LASIX ) tablet 20 mg  20 mg Oral Daily Elgergawy, Dawood S, MD   20 mg at 07/26/23 0831   gabapentin  (NEURONTIN ) capsule 1,200 mg  1,200 mg Oral QHS [facility]       gabapentin  (NEURONTIN ) capsule 600 mg  600 mg Oral Daily Amin, Sumayya, MD   600 mg at 07/26/23 1052   hydrALAZINE  (APRESOLINE ) injection 5 mg  5 mg Intravenous Q4H PRN Elgergawy, Dawood S, MD       hydrALAZINE  (APRESOLINE ) tablet 50 mg  50 mg Oral TID Elgergawy, Dawood S, MD   50 mg at 07/26/23 0831   insulin  aspart (novoLOG ) injection 0-15 Units  0-15 Units Subcutaneous TID WC Elgergawy, Dawood S, MD   3 Units at 07/26/23 1316   insulin  aspart (novoLOG ) injection 0-5 Units  0-5 Units Subcutaneous QHS Elgergawy, Dawood S, MD   2 Units at 07/26/23 0228   insulin  glargine-yfgn (SEMGLEE ) injection 40 Units  40 Units Subcutaneous QHS Elgergawy, Dawood S, MD   40 Units at 07/26/23 0244   ketorolac  (TORADOL ) 15 MG/ML injection 15 mg  15 mg Intravenous Q6H PRN Amin, Sumayya, MD   15 mg at 07/26/23 0849   levETIRAcetam  (KEPPRA ) tablet  500 mg  500 mg Oral Daily Elgergawy, Dawood S, MD   500 mg at 07/26/23 0830   levETIRAcetam  (KEPPRA ) tablet 750 mg  750 mg Oral QHS Elgergawy, Dawood S, MD   750 mg at 07/26/23 0158   mirabegron  ER (MYRBETRIQ ) tablet 50 mg  50 mg Oral Daily Elgergawy, Dawood S, MD   50 mg at 07/26/23 9165   oxyCODONE  (Oxy IR/ROXICODONE ) immediate release tablet 5 mg  5 mg Oral Q4H PRN Elgergawy, Dawood S, MD   5 mg at 07/26/23 1425   pantoprazole  (PROTONIX ) EC tablet 40 mg  40 mg Oral Daily Elgergawy, Dawood S, MD   40 mg at 07/26/23 [phone]  rosuvastatin  (CRESTOR ) tablet 40 mg  40 mg Oral Daily Elgergawy, Dawood S, MD   40 mg at 07/26/23 9165   Current Outpatient Medications  Medication Sig Dispense Refill   albuterol  (ACCUNEB ) 1.25 MG/3ML nebulizer solution USE 1 VIAL VIA NEBULIZER TWICE  DAILY 75 mL 0   albuterol  (VENTOLIN  HFA) 108 (90 Base) MCG/ACT inhaler USE 1 TO 2 INHALATIONS BY MOUTH  EVERY 6 HOURS AS NEEDED 17 g 2   amitriptyline  (ELAVIL ) 25 MG tablet Take 2-3 tablets (50-75 mg total) by mouth at bedtime. 90 tablet 5   amLODipine  (NORVASC ) 10 MG tablet TAKE 1 TABLET BY MOUTH DAILY 90 tablet 1   buprenorphine  (BUTRANS ) 7.5 MCG/HR Place 1 patch onto the skin once a week. 4 patch 2   butalbital -acetaminophen -caffeine  (FIORICET ) 50-325-40 MG tablet Take 1-2 tablets by mouth every 6 (six) hours as needed for headache. 10 tablet 0   carvedilol  (COREG ) 25 MG tablet Take 25 mg by mouth 2 (two) times daily.     cholecalciferol  (VITAMIN D3) 25 MCG (1000 UNIT) tablet Take 1 tablet (1,000 Units total) by mouth daily. 90 tablet 1   clopidogrel  (PLAVIX ) 75 MG tablet Take 1 tablet (75 mg total) by mouth daily. 90 tablet 0   cycloSPORINE  (RESTASIS ) 0.05 % ophthalmic emulsion Place 2 drops into both eyes 2 (two) times daily.     Dulaglutide  (TRULICITY ) 4.5 MG/0.5ML SOAJ Inject 4.5 mg as directed once a week. 6 mL 3   DULoxetine  (CYMBALTA ) 60 MG capsule TAKE 1 CAPSULE BY MOUTH EVERY DAY 90 capsule 0   EPINEPHrine  0.3  mg/0.3 mL IJ SOAJ injection INJECT 1 PEN INTRAMUSCULARLY AS  NEEDED FOR ALLERGIC RESPONSE AS  DIRECTED BY MD. SEEK MEDICAL  HELP AFTER USE 2 each 1   ezetimibe  (ZETIA ) 10 MG tablet TAKE 1 TABLET BY MOUTH DAILY 100 tablet 1   furosemide  (LASIX ) 20 MG tablet TAKE 1 TABLET BY MOUTH DAILY 90 tablet 1   gabapentin  (NEURONTIN ) 600 MG tablet 600 mg qAM, 600 mg qPM, 1200 mg QHS 120 tablet 5   Glucagon  (GVOKE HYPOPEN  2-PACK) 1 MG/0.2ML SOAJ INJECT SUB-Q 1MG  ONCE AS NEEDED  FOR UP TO 1 DOSE. ONLY FOR  SEVERE LOW BLOOD SUGAR NOT  RESPONDING TO DRINKING  JUICE/GLUCOSE TABS, WHEN CBG &lt;70 1.2 mL 1   hydrALAZINE  (APRESOLINE ) 50 MG tablet Take 1 tablet by mouth 3 (three) times daily.     insulin  glargine, 2 Unit Dial , (TOUJEO  MAX SOLOSTAR) 300 UNIT/ML Solostar Pen Inject 50 Units into the skin at bedtime. Plus sliding scale, max dose 80 units a day 51 mL 3   insulin  lispro (HUMALOG  KWIKPEN) 100 UNIT/ML KwikPen Inject 15 Units into the skin 3 (three) times daily. Plus Sliding scale, max dose 80 units a day 51 mL 3   levETIRAcetam  (KEPPRA ) 500 MG tablet Take 500-750 mg by mouth See admin instructions. Take 1 tablet (500mg ) by mouth every morning and take 1 tablets by mouth (750mg ) by mouth every night     meclizine  (ANTIVERT ) 25 MG tablet Take 1 tablet (25 mg total) by mouth 3 (three) times daily as needed for dizziness. 30 tablet 0   mirabegron  ER (MYRBETRIQ ) 50 MG TB24 tablet TAKE 1 TABLET BY MOUTH DAILY 100 tablet 1   nitroGLYCERIN  (NITROSTAT ) 0.4 MG SL tablet Place 0.4 mg under the tongue every 5 (five) minutes as needed for chest pain.     pantoprazole  (PROTONIX ) 40 MG tablet Take 1 tablet (40 mg total) by mouth daily as needed.  100 tablet 1   polyethylene glycol (MIRALAX  / GLYCOLAX ) 17 g packet Take 17 g by mouth daily as needed for mild constipation. 100 each 3   rosuvastatin  (CRESTOR ) 40 MG tablet TAKE 1 TABLET BY MOUTH DAILY 100 tablet 2   senna-docusate (SENOKOT-S) 8.6-50 MG tablet Take 1 tablet by mouth  at bedtime as needed for mild constipation. 60 tablet 0   tiZANidine  (ZANAFLEX ) 4 MG tablet Take 1 tablet (4 mg total) by mouth every 8 (eight) hours as needed for muscle spasms. 90 tablet 2   Ubrogepant  (UBRELVY ) 50 MG TABS 50 mg or 100 mg taken orally with or without food. If needed, a second dose may be taken at least 2 hours after the initial dose. The maximum dose in a 24-hour period is 200 mg 60 tablet 2   WIXELA INHUB 250-50 MCG/DOSE AEPB Inhale 1 puff into the lungs 2 (two) times daily.     ACCU-CHEK GUIDE TEST test strip USE 1 STRIP IN THE MORNING, AT  NOON, AND AT BEDTIME 100 strip 9   Continuous Glucose Sensor (FREESTYLE LIBRE 3 PLUS SENSOR) MISC Inject 1 Device into the skin continuous. Change every 14 days 6 each 3   Continuous Glucose Sensor (FREESTYLE LIBRE 3 SENSOR) MISC 1 Device by Does not apply route continuous. 6 each 3   Insulin  Pen Needle (PEN NEEDLES) 32G X 4 MM MISC 1 Device by Does not apply route in the morning, at noon, in the evening, and at bedtime. 200 each 2     Discharge Medications: Please see discharge summary for a list of discharge medications.  Relevant Imaging Results:  Relevant Lab Results:   Additional Information SS# [id number]  Delphine KANDICE Bring, RN

## 2023-07-26 NOTE — Plan of Care (Signed)
  Problem: Education: Goal: Ability to describe self-care measures that may prevent or decrease complications (Diabetes Survival Skills Education) will improve Outcome: Progressing   Problem: Metabolic: Goal: Ability to maintain appropriate glucose levels will improve Outcome: Progressing   Problem: Skin Integrity: Goal: Risk for impaired skin integrity will decrease Outcome: Progressing   Problem: Tissue Perfusion: Goal: Adequacy of tissue perfusion will improve Outcome: Progressing   Problem: Nutrition: Goal: Adequate nutrition will be maintained Outcome: Progressing   Problem: Pain Managment: Goal: General experience of comfort will improve and/or be controlled Outcome: Progressing   Problem: Safety: Goal: Ability to remain free from injury will improve Outcome: Progressing

## 2023-07-26 NOTE — Hospital Course (Addendum)
 Taken him H&P.   [name]  is a 58 y.o. female,  with history of previous abdominal surgery, previous SBO, obesity, coronary artery disease, GERD, heart failure preserved ejection fraction, uncontrolled type II diabetes mellitus, insulin -dependent type II diabetes mellitus presented to ED after having a syncopal episode resulted in fall causing pain in bilateral feet and ankle, unable to bear weight on left ankle.  Patient felt lightheadedness while walking towards her car and then passed out.  On presentation stable vitals, labs with glucose of 313, leukocytosis at 12.2. CT head with no acute abnormality.  Lower extremity imaging with left foot acute mildly displaced fracture at the base of fifth metatarsal, patient was given cam boot in ED.  7/7: Vitals with mildly elevated blood pressure, labs stable, having significant pain in left foot at fracture site.  Echocardiogram ordered to complete the syncopal workup. Imaging of R fibula was obtained as she was complaining of lower leg pain and it was negative for any acute fractures. Patient continued to complain about significant pain and unable to bear weight-PT and OT are recommending SNF.  Patient is medically stable for discharge.  Podiatry saw her and they are recommending weightbearing as tolerated, use cam boot with ambulation and outpatient follow-up in 2 to 3 weeks.  7/8: Hemodynamically stable, mildly elevated postprandial CBG so adding 5 units with meal.  Awaiting placement

## 2023-07-26 NOTE — Evaluation (Signed)
 Occupational Therapy Evaluation Patient Details Name: [name] MRN: [id number] DOB: Mar 07, 1965 Today's Date: 07/26/2023   History of Present Illness   58 y.o. female arrived s/p fall, left foot pain, right ankle pain and dizziness. Admitted for UTI. PMH: COPD, DM, HTN, CAD, OSA on CPAP, CVA (L side deficits), CKD, diastolic CHF, HLD, seizures, L eye blindness.  X-ray significant for left foot acute mildly displaced fracture at the base of the fifth metatarsal. CAM boot.     Clinical Impressions Pt was seen for OT evaluation this date. Prior to hospital admission, pt was living at home with her family who provide some assist for ADL and IADL. Pt was ambulating with a QC in the home. Pt endorses 4 falls in past 49mo. Pt presents to acute OT demonstrating impaired ADL performance and functional mobility 2/2 decreased strength, balance, activity tolerance, BP low + dizzy, and significant L foot and calf pain with any ROM (See OT problem list for additional functional deficits). Pt currently requires MAX A for LB ADL, MIN A for STS transfer with heavy BUE reliance on RW to maintain NWBing of LLE (pending imaging of tib/fib per MD). SpO2 in low 90''s throughout on 2L. Pt would benefit from skilled OT services to address noted impairments and functional limitations (see below for any additional details) in order to maximize safety and independence while minimizing falls risk and caregiver burden. Anticipate the need for follow up OT services upon acute hospital DC.    If plan is discharge home, recommend the following:   A lot of help with walking and/or transfers;A lot of help with bathing/dressing/bathroom;Assist for transportation;Assistance with cooking/housework;Help with stairs or ramp for entrance     Functional Status Assessment   Patient has had a recent decline in their functional status and demonstrates the ability to make significant improvements in function in a reasonable and  predictable amount of time.     Equipment Recommendations   Other (comment) (defer; pt reports w/c unable to fit in home)     Recommendations for Other Services         Precautions/Restrictions   Precautions Precautions: Fall Recall of Precautions/Restrictions: Intact Required Braces or Orthoses: Other Brace Other Brace: CAM boot L foot Restrictions Weight Bearing Restrictions Per Provider Order: Yes LLE Weight Bearing Per Provider Order: Non weight bearing Other Position/Activity Restrictions: LLE WBAT in CAM boot; however now pending tib/fib imaging to r/o fractures to NWBing per Dr. Caleen via secure chat 7/7 am     Mobility Bed Mobility Overal bed mobility: Needs Assistance Bed Mobility: Supine to Sit, Sit to Supine     Supine to sit: Supervision, HOB elevated Sit to supine: Min assist   General bed mobility comments: MIN A for BLE mgt back to bed    Transfers Overall transfer level: Needs assistance Equipment used: Rolling walker (2 wheels) Transfers: Sit to/from Stand Sit to Stand: Min assist           General transfer comment: MIN A with RW, VC for NWBing through LLE, pain limited      Balance Overall balance assessment: Needs assistance Sitting-balance support: Feet unsupported, No upper extremity supported, Single extremity supported Sitting balance-Leahy Scale: Fair     Standing balance support: Bilateral upper extremity supported, Reliant on assistive device for balance Standing balance-Leahy Scale: Poor Standing balance comment: heavily reliant on RW  ADL either performed or assessed with clinical judgement   ADL Overall ADL's : Needs assistance/impaired Eating/Feeding: Independent   Grooming: Sitting;Independent               Lower Body Dressing: Sitting/lateral leans;Moderate assistance;Maximal assistance Lower Body Dressing Details (indicate cue type and reason): MAX A to don CAM boot,  MOD-MAX otherwise                     Vision         Perception         Praxis         Pertinent Vitals/Pain Pain Assessment Pain Assessment: 0-10 Pain Score: 10-Worst pain ever Pain Location: L calf down to the toes Pain Descriptors / Indicators: Pressure, Stabbing Pain Intervention(s): Limited activity within patient's tolerance, Monitored during session, Premedicated before session, Repositioned     Extremity/Trunk Assessment Upper Extremity Assessment Upper Extremity Assessment: Generalized weakness   Lower Extremity Assessment Lower Extremity Assessment: Generalized weakness;LLE deficits/detail;RLE deficits/detail RLE Deficits / Details: R foot/ankle pain with WBing RLE: Unable to fully assess due to pain LLE Deficits / Details: significant calf and ankle/foot pain limiting active DF/PF, denies sensory deficits LLE: Unable to fully assess due to pain LLE Sensation: WNL LLE Coordination: decreased fine motor;decreased gross motor       Communication Communication Communication: No apparent difficulties   Cognition Arousal: Alert Behavior During Therapy: WFL for tasks assessed/performed Cognition: No apparent impairments                               Following commands: Intact       Cueing  General Comments   Cueing Techniques: Verbal cues  supine BP 89/51, +dizzy; sitting BP 99/66, dizziness the same - MD and RN notified   Exercises     Shoulder Instructions      Home Living Family/patient expects to be discharged to:: Private residence Living Arrangements: Children (daughter, son in Social worker, grandkids) Available Help at Discharge: Family;Available 24 hours/day (dtr works from home) Type of Home: House Home Access: Stairs to enter [employer] of Steps: 1 Entrance Stairs-Rails: None Home Layout: One level     Bathroom Shower/Tub: Tub/shower unit         Home Equipment: Agricultural consultant (2 wheels);Rollator (4  wheels);Cane - quad;BSC/3in1;Shower seat;Tub bench   Additional Comments: rollator broken      Prior Functioning/Environment Prior Level of Function : Needs assist             Mobility Comments: QC in the home, rollator for dr appts; 4 falls in 63mo ADLs Comments: help from family for bathing, dressing, BSC beside bed versus bathroom for toileting depending on how weak her legs feel; indep with med mgt, assist for cooking, cleaning, driving    OT Problem List: Decreased strength;Decreased coordination;Pain;Cardiopulmonary status limiting activity;Decreased activity tolerance;Impaired balance (sitting and/or standing);Decreased knowledge of use of DME or AE;Obesity   OT Treatment/Interventions: Self-care/ADL training;Therapeutic exercise;Therapeutic activities;DME and/or AE instruction;Patient/family education;Balance training      OT Goals(Current goals can be found in the care plan section)   Acute Rehab OT Goals Patient Stated Goal: have less pain and go to rehab OT Goal Formulation: With patient Time For Goal Achievement: 08/09/23 Potential to Achieve Goals: Good   OT Frequency:  Min 2X/week    Co-evaluation              AM-PAC OT 6 Clicks  Daily Activity     Outcome Measure Help from another person eating meals?: None Help from another person taking care of personal grooming?: None Help from another person toileting, which includes using toliet, bedpan, or urinal?: A Lot Help from another person bathing (including washing, rinsing, drying)?: A Lot Help from another person to put on and taking off regular upper body clothing?: None Help from another person to put on and taking off regular lower body clothing?: A Lot 6 Click Score: 18   End of Session Equipment Utilized During Treatment: Rolling walker (2 wheels);Oxygen Nurse Communication: Mobility status;Patient requests pain meds;Weight bearing status (BP)  Activity Tolerance: Patient limited by  pain;Treatment limited secondary to medical complications (Comment) (dizziness) Patient left: in bed;with call bell/phone within reach  OT Visit Diagnosis: Other abnormalities of gait and mobility (R26.89);Repeated falls (R29.6);Muscle weakness (generalized) (M62.81);Pain Pain - Right/Left: Left Pain - part of body: Leg;Ankle and joints of foot                Time: 1055-1130 OT Time Calculation (min): 35 min Charges:  OT General Charges $OT Visit: 1 Visit OT Evaluation $OT Eval Moderate Complexity: 1 Mod OT Treatments $Therapeutic Activity: 8-22 mins  Warren SAUNDERS., MPH, MS, OTR/L ascom [phone] 07/26/23, 1:58 PM

## 2023-07-26 NOTE — ED Notes (Signed)
 Pt 2 person assist to toilet in rm. Pt able to urinate at this time. Pt placed back on monitor. Call light within reach. Pt has no further needs at this time.

## 2023-07-26 NOTE — Progress Notes (Signed)
   07/26/23 1727  Vitals  Temp 98.1 F (36.7 C)  Temp Source Oral  BP (!) 154/79  MAP (mmHg) 98  BP Location Right Arm  BP Method Automatic  Patient Position (if appropriate) Lying  Pulse Rate 76  Pulse Rate Source Monitor  Resp 18  Level of Consciousness  Level of Consciousness Alert  Oxygen Therapy  SpO2 100 %  O2 Device Room Air   Patient admitted via wheelchair alert, room air, with stable vitals.  Safety precautions in place with call bell education.  Plan of care continues.

## 2023-07-26 NOTE — Evaluation (Signed)
 Physical Therapy Evaluation Patient Details Name: [name] MRN: [id number] DOB: February 25, 1965 Today's Date: 07/26/2023  History of Present Illness  [name] is a 57yoF who comes to [facility] on 07/25/23 after fall due to dizziness, subsequent ankle pain- pt was walking to car to go to church when she became dizzy and passed out. x-ray revealing of left foot displaced fracture at the base of the fifth metatarsal. Podiatry saw her and they are recommending weightbearing as tolerated, use cam boot with ambulation and outpatient follow-up in 2 to 3 weeks.   Pt unable to bear weight on Left foot. PMH: SBO, CAD, GERD, HFpEF, CVA with residual.  Clinical Impression  Pt finishing lunch on entry, agreeable to PT evaluation. Pt unable to complete task of donning CAM boot, requires  assist for this. Pt able to STS transfers and step pivot transfers with minGuardA and RW, but pain/dizziness precludes AMB >69ft. BP assessment revealing of mild drop systolic in standing. Pt will need a WC at DC to manage appreciable mobility distances. RN brings additional pain meds mid session. Pt left up to recliner at end of session, all needs met.       If plan is discharge home, recommend the following: A lot of help with walking and/or transfers;Help with stairs or ramp for entrance;Assist for transportation;Assistance with cooking/housework;Two people to help with bathing/dressing/bathroom   Can travel by private vehicle   Yes    Equipment Recommendations Wheelchair (measurements PT);Wheelchair cushion (measurements PT)  Recommendations for Other Services       Functional Status Assessment Patient has had a recent decline in their functional status and demonstrates the ability to make significant improvements in function in a reasonable and predictable amount of time.     Precautions / Restrictions Precautions Precautions: Fall Recall of Precautions/Restrictions: Intact Other Brace: CAM boot L  foot Restrictions LLE Weight Bearing Per Provider Order: Weight bearing as tolerated Other Position/Activity Restrictions: in CAM boot; podiatry subsequently recommended postop shoe.      Mobility  Bed Mobility Overal bed mobility: Modified Independent Bed Mobility: Supine to Sit     Supine to sit: Supervision, HOB elevated          Transfers Overall transfer level: Needs assistance Equipment used: Rolling walker (2 wheels) Transfers: Sit to/from Stand, Bed to chair/wheelchair/BSC Sit to Stand: Supervision   Step pivot transfers: Contact guard assist       General transfer comment: Able to achieve from ED gurney and from guest chair;    Ambulation/Gait Ambulation/Gait assistance: Contact guard assist Gait Distance (Feet): 5 Feet Assistive device: Rolling walker (2 wheels) Gait Pattern/deviations: Step-to pattern       General Gait Details: CAM boot donned; antalgic  Stairs            Wheelchair Mobility     Tilt Bed    Modified Rankin (Stroke Patients Only)       Balance                                             Pertinent Vitals/Pain Pain Assessment Pain Assessment: 0-10 Pain Score: 10-Worst pain ever Pain Location: L calf down to the toes Pain Descriptors / Indicators: Pressure, Stabbing Pain Intervention(s): Limited activity within patient's tolerance, Monitored during session, Premedicated before session    Home Living Family/patient expects to be discharged to:: (P) Private residence Living Arrangements: (  P) Children (DTR ([name]) and SIL adn thei grandkids (16y0, 12yo, 8yo, 1yo)) Available Help at Discharge: (P) Family;Available 24 hours/day (DTR works from home; SIL local truck driver) Type of Home: (P) House Home Access: (P) Stairs to enter Entrance Stairs-Rails: (P) None Entrance Stairs-Number of Steps: (P) 1   Home Layout: (P) One level Home Equipment: (P) Rolling Walker (2 wheels);Rollator (4  wheels);Cane - quad;BSC/3in1;Shower seat;Tub bench Additional Comments: (P) rollator when out of the house, QC in house at baseline;  (right sided aqdjustment knob is missing from rollator)    Prior Function Prior Level of Function : (P) Needs assist             Mobility Comments: (P) QC in the home, rollator for dr appts; 4 falls in 61mo ADLs Comments: (P) help from family for bathing, dressing, BSC beside bed versus bathroom for toileting depending on how weak her legs feel; indep with med mgt, assist for cooking, cleaning, driving     Extremity/Trunk Assessment   Upper Extremity Assessment Upper Extremity Assessment: Generalized weakness    Lower Extremity Assessment Lower Extremity Assessment: Generalized weakness;LLE deficits/detail;RLE deficits/detail RLE Deficits / Details: R foot/ankle pain with WBing RLE: Unable to fully assess due to pain LLE Deficits / Details: significant calf and ankle/foot pain limiting active DF/PF, denies sensory deficits LLE: Unable to fully assess due to pain LLE Sensation: WNL LLE Coordination: decreased fine motor;decreased gross motor       Communication   Communication Communication: No apparent difficulties    Cognition Arousal: Alert Behavior During Therapy: WFL for tasks assessed/performed   PT - Cognitive impairments: No apparent impairments                                 Cueing       General Comments General comments (skin integrity, edema, etc.): supine BP 89/51, +dizzy; sitting BP 99/66, dizziness the same - MD and RN notified    Exercises     Assessment/Plan    PT Assessment Patient needs continued PT services  PT Problem List Decreased strength;Decreased range of motion;Decreased activity tolerance;Decreased balance;Decreased mobility;Decreased knowledge of precautions       PT Treatment Interventions Gait training;Stair training;Functional mobility training;Therapeutic activities;Therapeutic  exercise;Balance training;Patient/family education    PT Goals (Current goals can be found in the Care Plan section)  Acute Rehab PT Goals Patient Stated Goal: be able to walk again PT Goal Formulation: With patient Time For Goal Achievement: 08/09/23 Potential to Achieve Goals: Good    Frequency Min 2X/week     Co-evaluation               AM-PAC PT 6 Clicks Mobility  Outcome Measure Help needed turning from your back to your side while in a flat bed without using bedrails?: A Little Help needed moving from lying on your back to sitting on the side of a flat bed without using bedrails?: A Little Help needed moving to and from a bed to a chair (including a wheelchair)?: A Little Help needed standing up from a chair using your arms (e.g., wheelchair or bedside chair)?: A Little Help needed to walk in hospital room?: A Little Help needed climbing 3-5 steps with a railing? : A Lot 6 Click Score: 17    End of Session Equipment Utilized During Treatment: Oxygen Activity Tolerance: Patient tolerated treatment well;Patient limited by pain;Treatment limited secondary to medical complications (Comment) Patient left: in  chair;with call bell/phone within reach Nurse Communication: Mobility status PT Visit Diagnosis: Difficulty in walking, not elsewhere classified (R26.2);Other abnormalities of gait and mobility (R26.89);Muscle weakness (generalized) (M62.81)    Time: 8643-8561 PT Time Calculation (min) (ACUTE ONLY): 42 min   Charges:   PT Evaluation $PT Eval Moderate Complexity: 1 Mod PT Treatments $Gait Training: 8-22 mins PT General Charges $$ ACUTE PT VISIT: 1 Visit       3:08 PM, 07/26/23 Peggye JAYSON Linear, PT, DPT Physical Therapist - [facility]  [phone] (ASCOM)     Harlon Kutner C 07/26/2023, 3:04 PM

## 2023-07-26 NOTE — H&P (Signed)
 [facility] H&P   Patient Demographics:    [name], is a 58 y.o. female  MRN: [id number]   DOB - October 14, 1965  Admit Date - 07/25/2023  Outpatient Primary MD for the patient is [name], [name]  Referring MD/NP/PA: Dr Jacolyn  Patient coming from: home  Chief Complaint  Patient presents with   Fall   Ankle Pain   Dizziness      HPI:    [name]  is a 58 y.o. female,  with history of previous abdominal surgery, previous SBO, obesity, coronary artery disease, GERD, heart failure preserved ejection fraction, uncontrolled type II diabetes mellitus, insulin -dependent type II diabetes mellitus. - Patient presents with fall, left foot pain, right ankle pain and dizziness, patient reports that she was getting ready for church, she was walking towards her car, she started to feel lightheaded, then report she passed out, causing her to fall, after the fall she noted bilateral foot and ankle pain, worse in the left, reports she has not been able to wear weight on her left ankle, which prompted her to come to ED, patient with history of stroke, with residual left-sided weakness, ambulate with walker usually, she denies chest pain, fever, chills or shortness of breath. - In ED labs significant for glucose of 313, creatinine within normal limit, white blood cell count of 11.2, CT head with no acute findings, x-ray significant for left foot acute mildly displaced fracture at the base of the fifth metatarsal, patient had cam boot applied by ED, and [facility]  hospitalist consulted to admit.   Review of systems:      A full 10 point Review of Systems was done, except as stated above, all other Review of Systems were negative.   With Past History of the following :    Past Medical History:  Diagnosis Date   Anxiety and depression 09/13/2006   Qualifier: Diagnosis of  By: Gladis [name],  Nykedtra     Arthritis    joint pain    Asthma    CHF (congestive heart failure) (HCC)    COPD (chronic obstructive pulmonary disease) (HCC)    chronic bronchitis    Depression    Diabetes mellitus    since age 77; type 2 IDDM   Diabetic neuropathy, painful (HCC)    FEET AND HANDS   Diabetic retinopathy (HCC)    Diastolic dysfunction    a.  Echo 11/17: EF 60-65%, mild LVH, no RWMA, Gr1DD, mild AI, dilated aortic root measuring 38 mm, mildly dilated ascending aorta   Dilated aortic root (HCC)    38mm by echo 11/2015   Dizziness    secondary to diabetes and hypertension    Epilepsy idiopathic petit mal (HCC)    last seizure 2012;controlled w/ topomax   GERD (gastroesophageal reflux disease)    Glaucoma    NOT ON ANY EYE DROPS    Headache(784.0)  migraines    Heart murmur    born with    History of stress test    a. 11/17: Normal perfusion, EF 53%, normal study   Hx of blood clots    hematomas removed from left side of brain from 11mos to 58yrs old    Hyperlipidemia    Hypertension    Legally blind    left eye    MIGRAINE HEADACHE 09/14/2006   Qualifier: Diagnosis of  By: Gladis [name], Nykedtra     Myocardial infarction [facility])    a.  Patient reported history of without objective documentation   Nephrolithiasis    frequent urination , urination at nite  PT SEEN IN ER 09/22/11 FOR BACK AND RT SIDED PAIN--HAS KNOWN STONE RT URETER AND UA IN ER SHOWED UTI   Pain 09/23/2011   LOWER BACK AND RIGHT SIDE--PT HAS RIGHT URETERAL STONE   Pneumonia    hx of 2009   Pyelonephritis    SBO (small bowel obstruction) (HCC) 04/23/2021   Seizures (HCC)    Sleep apnea    [facility];does not use Cpap ; mild   Small bowel obstruction (HCC) 05/18/2021   Stress incontinence    Stroke (HCC)    last 2003  RESIDUAL LEFT LEG WEAKNESS--NO OTHER RESIDUAL PROBLEMS      Past Surgical History:  Procedure Laterality Date   BRAIN HEMATOMA EVACUATION     five procedures total,  first procedure when 71 months old, last at 58 years of age.   CYSTOSCOPY W/ RETROGRADES  09/24/2011   Procedure: CYSTOSCOPY WITH RETROGRADE PYELOGRAM;  Surgeon: Norleen JINNY Seltzer, MD;  Location: [facility];  Service: Urology;  Laterality: Right;   CYSTOSCOPY WITH URETEROSCOPY  09/24/2011   Procedure: CYSTOSCOPY WITH URETEROSCOPY;  Surgeon: Norleen JINNY Seltzer, MD;  Location: [facility];  Service: Urology;  Laterality: Right;  Balloon dilation right ureter    CYSTOSCOPY/RETROGRADE/URETEROSCOPY  08/24/2011   Procedure: CYSTOSCOPY/RETROGRADE/URETEROSCOPY;  Surgeon: Toribio Neysa Repine, MD;  Location: [facility];  Service: Urology;  Laterality: Right;  Cysto, Right retrograde Pyelogram, right stent placement.    INCISIONAL HERNIA REPAIR  05/05/2011   Procedure: LAPAROSCOPIC INCISIONAL HERNIA;  Surgeon: Donnice Bury, MD;  Location: [facility] OR;  Service: General;  Laterality: N/A;   kidney stone removal     LAPAROTOMY N/A 06/12/2021   Procedure: EXPLORATORY LAPAROTOMY, LYSIS OF ADHESIONS;  Surgeon: Desiderio Schanz, MD;  Location: [facility] ORS;  Service: General;  Laterality: N/A;   MASS EXCISION  05/05/2011   Procedure: EXCISION MASS;  Surgeon: Donnice Bury, MD;  Location: [facility] OR;  Service: General;  Laterality: Right;   PC[facility]     RETINAL DETACHMENT SURGERY Left 1990   RIGHT/LEFT HEART CATH AND CORONARY ANGIOGRAPHY N/A 04/28/2019   Procedure: RIGHT/LEFT HEART CATH AND CORONARY ANGIOGRAPHY;  Surgeon: Florencio Cara BIRCH, MD;  Location: [facility] INVASIVE CV LAB;  Service: Cardiovascular;  Laterality: N/A;   SHUNT REMOVAL     shunt inserted at age 60 removed at age 65    VAGINAL HYSTERECTOMY  67      Social History:     Social History   Tobacco Use   Smoking status: Former    Current packs/day: 0.00    Average packs/day: 1 pack/day for 15.0 years (15.0 ttl pk-yrs)    Types: Cigarettes    Start date: 01/20/1972    Quit date: 01/20/1987    Years since quitting: 36.5   Smokeless tobacco: Never   Tobacco comments:    quit  more than  20 years  Substance Use Topics   Alcohol  use: Not Currently    Alcohol /week: 1.0 standard drink of alcohol     Types: 1 Glasses of wine per week    Comment: occ        Family History :     Family History  Problem Relation Age of Onset   Uterine cancer Mother    Hypertension Mother    Cancer Mother    Brain cancer Maternal Grandmother    Hypertension Maternal Grandmother    Birth defects Daughter    Hypertension Daughter    Cirrhosis Maternal Grandfather    ADD / ADHD Maternal Grandfather    Birth defects Maternal Grandfather    Diabetes Maternal Grandfather    Anesthesia problems Neg Hx    Colon cancer Neg Hx    Esophageal cancer Neg Hx    Pancreatic cancer Neg Hx      Home Medications:   Prior to Admission medications   Medication Sig Start Date End Date Taking? Authorizing Provider  cloNIDine  (CATAPRES ) 0.1 MG tablet Take by mouth. 07/14/23  Yes [provider]  nitroGLYCERIN  (NITROSTAT ) 0.4 MG SL tablet Place under the tongue. 06/10/23  Yes [provider]  ACCU-CHEK GUIDE TEST test strip USE 1 STRIP IN THE MORNING, AT  NOON, AND AT BEDTIME 03/31/23   Dartha Ernst, MD  albuterol  (ACCUNEB ) 1.25 MG/3ML nebulizer solution USE 1 VIAL VIA NEBULIZER TWICE  DAILY 05/12/23   [name], [name], [name]  albuterol  (VENTOLIN  HFA) 108 (90 Base) MCG/ACT inhaler USE 1 TO 2 INHALATIONS BY MOUTH  EVERY 6 HOURS AS NEEDED 05/31/23   [name], [name], [name]  amitriptyline  (ELAVIL ) 25 MG tablet Take 2-3 tablets (50-75 mg total) by mouth at bedtime. 06/30/23 12/27/23  [name], [name], NP  amLODipine  (NORVASC ) 10 MG tablet TAKE 1 TABLET BY MOUTH DAILY 10/01/22   [name], [name], [name]  buprenorphine  (BUTRANS ) 7.5 MCG/HR Place 1 patch onto the skin once a week. 05/13/23 08/05/23  [name], [name], NP  butalbital -acetaminophen -caffeine  (FIORICET ) 50-325-40 MG tablet Take 1-2 tablets by mouth every 6 (six) hours as needed for headache. 04/26/23 04/25/24  Marcelino Nurse, MD  carvedilol  (COREG ) 25 MG tablet  Take 25 mg by mouth 2 (two) times daily. 03/06/21   [provider]  cholecalciferol  (VITAMIN D3) 25 MCG (1000 UNIT) tablet Take 1 tablet (1,000 Units total) by mouth daily. 11/02/22   [name], [name], [name]  clopidogrel  (PLAVIX ) 75 MG tablet Take 1 tablet (75 mg total) by mouth daily. 10/10/19   [name], MD  Continuous Glucose Sensor (FREESTYLE LIBRE 3 PLUS SENSOR) MISC Inject 1 Device into the skin continuous. Change every 14 days 01/14/23   Dartha Ernst, MD  Continuous Glucose Sensor (FREESTYLE LIBRE 3 SENSOR) MISC 1 Device by Does not apply route continuous. 09/25/22   [name], [name], MD  cycloSPORINE  (RESTASIS ) 0.05 % ophthalmic emulsion Place 2 drops into both eyes 2 (two) times daily.    [provider]  Dulaglutide  (TRULICITY ) 4.5 MG/0.5ML SOAJ Inject 4.5 mg as directed once a week. 04/22/23   [name], [name], MD  DULoxetine  (CYMBALTA ) 60 MG capsule TAKE 1 CAPSULE BY MOUTH EVERY DAY 10/26/22   [name], [name], [name]  EPINEPHrine  0.3 mg/0.3 mL IJ SOAJ injection INJECT 1 PEN INTRAMUSCULARLY AS  NEEDED FOR ALLERGIC RESPONSE AS  DIRECTED BY MD. [facility]  HELP AFTER USE 05/21/23   [name], [name], [name]  ezetimibe  (ZETIA ) 10 MG tablet TAKE 1 TABLET BY MOUTH DAILY 05/26/23  [name], [name], [name]  furosemide  (LASIX ) 20 MG tablet TAKE 1 TABLET BY MOUTH DAILY 10/01/22   [name], [name], [name]  gabapentin  (NEURONTIN ) 600 MG tablet 600 mg qAM, 600 mg qPM, 1200 mg QHS 07/12/23   [name], [name], NP  Glucagon  (GVOKE HYPOPEN  2-PACK) 1 MG/0.2ML SOAJ INJECT SUB-Q 1MG  ONCE AS NEEDED  FOR UP TO 1 DOSE. ONLY FOR  SEVERE LOW BLOOD SUGAR NOT  RESPONDING TO DRINKING  JUICE/GLUCOSE TABS, WHEN CBG &lt;70 05/18/23   [name], [name], MD  hydrALAZINE  (APRESOLINE ) 50 MG tablet Take 1 tablet by mouth 3 (three) times daily. 05/22/22   [provider]  insulin  glargine, 2 Unit Dial , (TOUJEO  MAX SOLOSTAR) 300 UNIT/ML Solostar Pen Inject 50 Units into the skin at bedtime. Plus sliding scale, max dose 80 units a  day 06/15/23 09/13/23  Dartha [name], MD  insulin  lispro (HUMALOG  KWIKPEN) 100 UNIT/ML KwikPen Inject 15 Units into the skin 3 (three) times daily. Plus Sliding scale, max dose 80 units a day 06/15/23 09/13/23  Dartha [name], MD  Insulin  Pen Needle (PEN NEEDLES) 32G X 4 MM MISC 1 Device by Does not apply route in the morning, at noon, in the evening, and at bedtime. 04/19/23   [name], [name], MD  levETIRAcetam  (KEPPRA ) 500 MG tablet Take 500-750 mg by mouth See admin instructions. Take 1 tablet (500mg ) by mouth every morning and take 1 tablets by mouth (750mg ) by mouth every night    [provider]  meclizine  (ANTIVERT ) 25 MG tablet Take 1 tablet (25 mg total) by mouth 3 (three) times daily as needed for dizziness. 06/16/22   [name], MD  mirabegron  ER (MYRBETRIQ ) 50 MG TB24 tablet TAKE 1 TABLET BY MOUTH DAILY 05/26/23   [name], [name], [name]  pantoprazole  (PROTONIX ) 40 MG tablet Take 1 tablet (40 mg total) by mouth daily as needed. 03/15/23   [name], [name], [name]  polyethylene glycol (MIRALAX  / GLYCOLAX ) 17 g packet Take 17 g by mouth daily as needed for mild constipation. 10/21/21   Drusilla Sabas RAMAN, MD  rosuvastatin  (CRESTOR ) 40 MG tablet TAKE 1 TABLET BY MOUTH DAILY 11/19/22   [name], [name], MD  senna-docusate (SENOKOT-S) 8.6-50 MG tablet Take 1 tablet by mouth at bedtime as needed for mild constipation. 10/21/21   Drusilla Sabas RAMAN, MD  tiZANidine  (ZANAFLEX ) 4 MG tablet Take 1 tablet (4 mg total) by mouth every 8 (eight) hours as needed for muscle spasms. 02/18/23   Lateef, Bilal, MD  Ubrogepant  (UBRELVY ) 50 MG TABS 50 mg or 100 mg taken orally with or without food. If needed, a second dose may be taken at least 2 hours after the initial dose. The maximum dose in a 24-hour period is 200 mg 02/18/23   Marcelino Nurse, MD  WIXELA INHUB 250-50 MCG/DOSE AEPB Inhale 1 puff into the lungs 2 (two) times daily. 01/22/20   [provider]     Allergies:     Allergies  Allergen Reactions   Codeine  Swelling and Other (See Comments)    Swelling and burning of mouth (inside)   Farxiga  [Dapagliflozin ] Other (See Comments)    Recurrent UTI with farxiga    Lactose Intolerance (Gi) Nausea And Vomiting     Physical Exam:   Vitals  Blood pressure (!) 169/93, pulse 77, temperature 98.5 F (36.9 C), temperature source Oral, resp. rate (!) 28, height 5' 6 (1.676 m), weight 106.6 kg, SpO2 95%.   1. General Well-developed female, laying in bed, no apparent distress  2. Normal affect  and insight, Not Suicidal or Homicidal, Awake Alert, Oriented X 3.  3. No F.N deficits, ALL C.Nerves Intact, Strength 5/5 all 4 extremities, Sensation intact all 4 extremities, Plantars down going.  4. Ears and Eyes appear Normal, Conjunctivae clear, PERRLA. Moist Oral Mucosa.  5. Supple Neck, No JVD, No cervical lymphadenopathy appriciated, No Carotid Bruits.  6. Symmetrical Chest wall movement, Good air movement bilaterally, CTAB.  7. RRR, No Gallops, Rubs or Murmurs, No Parasternal Heave.  8. Positive Bowel Sounds, Abdomen Soft, No tenderness, No organomegaly appriciated,No rebound -guarding or rigidity.  9.  No Cyanosis, Normal Skin Turgor, No Skin Rash or Bruise.  10.  Left foot in a cam boot   Data Review:    CBC Recent Labs  Lab 07/25/23 1846  WBC 11.2*  HGB 13.0  HCT 41.4  PLT 291  MCV 91.2  MCH 28.6  MCHC 31.4  RDW 14.1   ------------------------------------------------------------------------------------------------------------------  Chemistries  Recent Labs  Lab 07/25/23 1846  NA 139  K 4.1  CL 104  CO2 26  GLUCOSE 313*  BUN 16  CREATININE 0.93  CALCIUM  8.8*   ------------------------------------------------------------------------------------------------------------------ estimated creatinine clearance is 81.4 mL/min (by C-G formula based on SCr of 0.93  mg/dL). ------------------------------------------------------------------------------------------------------------------ No results for input(s): TSH, T4TOTAL, T3FREE, THYROIDAB in the last 72 hours.  Invalid input(s): FREET3  Coagulation profile No results for input(s): INR, PROTIME in the last 168 hours. ------------------------------------------------------------------------------------------------------------------- No results for input(s): DDIMER in the last 72 hours. -------------------------------------------------------------------------------------------------------------------  Cardiac Enzymes No results for input(s): CKMB, TROPONINI, MYOGLOBIN in the last 168 hours.  Invalid input(s): CK ------------------------------------------------------------------------------------------------------------------    Component Value Date/Time   BNP 17.1 07/07/2021 1005     ---------------------------------------------------------------------------------------------------------------  Urinalysis    Component Value Date/Time   COLORURINE YELLOW (A) 06/12/2023 1150   APPEARANCEUR HAZY (A) 06/12/2023 1150   LABSPEC 1.017 06/12/2023 1150   PHURINE 7.0 06/12/2023 1150   GLUCOSEU >=500 (A) 06/12/2023 1150   GLUCOSEU NEGATIVE 09/21/2019 1038   HGBUR NEGATIVE 06/12/2023 1150   HGBUR negative 09/30/2006 1400   BILIRUBINUR NEGATIVE 06/12/2023 1150   BILIRUBINUR negative 06/04/2020 1535   KETONESUR 5 (A) 06/12/2023 1150   PROTEINUR NEGATIVE 06/12/2023 1150   UROBILINOGEN 0.2 06/04/2020 1535   UROBILINOGEN 0.2 09/21/2019 1038   NITRITE NEGATIVE 06/12/2023 1150   LEUKOCYTESUR SMALL (A) 06/12/2023 1150    ----------------------------------------------------------------------------------------------------------------   Imaging Results:    CT HEAD WO CONTRAST ( ) Result Date: 07/25/2023 CLINICAL DATA:  Syncope EXAM: CT HEAD WITHOUT CONTRAST TECHNIQUE:  Contiguous axial images were obtained from the base of the skull through the vertex without intravenous contrast. RADIATION DOSE REDUCTION: This exam was performed according to the departmental dose-optimization program which includes automated exposure control, adjustment of the mA and/or kV according to patient size and/or use of iterative reconstruction technique. COMPARISON:  06/16/2023 FINDINGS: Brain: Mild cerebral atrophy. No acute intracranial abnormality. Specifically, no hemorrhage, hydrocephalus, mass lesion, acute infarction, or significant intracranial injury. Densely calcified falx, unchanged. Vascular: No hyperdense vessel or unexpected calcification. Skull: No acute calvarial abnormality. Sinuses/Orbits: No acute findings Other: None IMPRESSION: Cerebral atrophy. No acute intracranial abnormality. Electronically Signed   By: Franky Crease M.D.   On: 07/25/2023 23:29   DG Foot Complete Left Result Date: 07/25/2023 CLINICAL DATA:  Mid foot pain after fall EXAM: LEFT FOOT - COMPLETE 3+ VIEW COMPARISON:  None Available. FINDINGS: Acute mildly displaced fracture at the base of the fifth metatarsal. Tiny plantar calcaneal spur. IMPRESSION: Acute mildly displaced fracture at the  base of the fifth metatarsal. Electronically Signed   By: Luke Bun M.D.   On: 07/25/2023 21:02   DG Foot Complete Right Result Date: 07/25/2023 CLINICAL DATA:  Mid foot pain after fall EXAM: RIGHT FOOT COMPLETE - 3+ VIEW COMPARISON:  None Available. FINDINGS: There is no evidence of fracture or dislocation. There is no evidence of arthropathy or other focal bone abnormality. Soft tissues are unremarkable. IMPRESSION: Negative. Electronically Signed   By: Luke Bun M.D.   On: 07/25/2023 21:01   DG Ankle Complete Left Result Date: 07/25/2023 CLINICAL DATA:  Fall, pain EXAM: LEFT ANKLE COMPLETE - 3+ VIEW COMPARISON:  None Available. FINDINGS: No acute bony abnormality. Specifically, no fracture, subluxation, or  dislocation. Joint spaces maintained. Mild diffuse soft tissue swelling. IMPRESSION: No acute bony abnormality. Electronically Signed   By: Franky Crease M.D.   On: 07/25/2023 19:41   DG Ankle Complete Right Result Date: 07/25/2023 CLINICAL DATA:  Fall, pain EXAM: RIGHT ANKLE - COMPLETE 3+ VIEW COMPARISON:  None Available. FINDINGS: No acute bony abnormality. Specifically, no fracture, subluxation, or dislocation. Mild lateral soft tissue swelling. IMPRESSION: No acute bony abnormality. Electronically Signed   By: Franky Crease M.D.   On: 07/25/2023 19:40   DG Chest 2 View Result Date: 07/25/2023 CLINICAL DATA:  Near syncope EXAM: CHEST - 2 VIEW COMPARISON:  06/12/2023 FINDINGS: Heart and mediastinal contours are within normal limits. No focal opacities or effusions. No acute bony abnormality. IMPRESSION: No active cardiopulmonary disease. Electronically Signed   By: Franky Crease M.D.   On: 07/25/2023 19:39     EKG:  Vent. rate 105 BPM PR interval 154 ms QRS duration 76 ms QT/QTcB 362/478 ms P-R-T axes 55 -18 76 Sinus tachycardia Otherwise normal ECG When compared with ECG of 12-Jun-2023 10:23, PREVIOUS ECG IS PRESENT  Assessment & Plan:    Principal Problem:   Near syncope Active Problems:   Seizure (HCC)   OSA (obstructive sleep apnea)   Essential hypertension, benign   GERD without esophagitis   Chronic diastolic CHF (congestive heart failure) (HCC)   Dyslipidemia associated with type 2 diabetes mellitus (HCC)   CAD (coronary artery disease)   Hemiparesis affecting left side as late effect of stroke (HCC)   PAD (peripheral artery disease) (HCC)   Uncontrolled type 2 diabetes mellitus with hyperglycemia (HCC)   Class 2 severe obesity with serious comorbidity and body mass index (BMI) of 39.0 to 39.9 in adult [facility])   Near syncope -She presents with a fall, but reports preceded by dizziness, lightheadedness, she was not orthostatic in ED, but this is most likely in the setting of  polypharmacy where she is on multiple medications could contribute to that. - CT head with no acute findings. - EKG with no acute findings - Monitor on telemetry -At baseline she has a history with CVA, residual left-sided weakness which contributes to unsteady gait and falls  Unsteady gait Fall Acute mildly displaced fracture at the base of the fifth metatarsal.  -Will consult PT, OT - Continue with as needed pain meds - Will place routine consult for podiatry   Essential hypertension, benign -She presents with near syncope, but her blood pressure is not low, actually is mildly elevated as well she is not orthostatic, so we will continue with home regimen , amlodipine  10 mg daily, carvedilol  25 mg p.o. twice daily, hydralazine  50 mg every 8 hours. - Resume Lasix  tomorrow   Dyslipidemia associated with type 2 diabetes mellitus -continue with home  dose statin   CAD (coronary artery disease) -She denies any chest pain or shortness of breath, continue Plavix , statin and beta-blockers   Uncontrolled type 2 diabetes mellitus with hyperglycemia (HCC) - Continue with home long-acting insulin , will keep an insulin  sliding scale as well -Will check A1c    History of CVA with left-sided weakness  - Reports it contributes to unsteady gait and multiple falls  - Continue with home Plavix , statin  - Will consult PT, OT   Physical debility PT OT assessment Fall precautions.  Chronic pain syndrome Neuropathy - She is on multiple medications, which has been managed by pain medicine , I will resume these meds for now as patient reports she is following closely with him as he has been trying to taper them slowly.  DVT Prophylaxis  Lovenox    AM Labs Ordered, also please review Full Orders  Family Communication: Admission, patients condition and plan of care including tests being ordered have been discussed with the patient who indicate understanding and agree with the plan and Code  Status.  Code Status Full   Likely DC to  home/pending PT evaluation  Consults called: sent consult to podiatry via inpatient basket  Admission status: Observation  Time spent in minutes : 70 minutes   Brayton Lye M.D on 07/26/2023 at 12:47 AM   [facility] - Office  [phone]

## 2023-07-26 NOTE — ED Notes (Signed)
 Pt starting to feel short of breath. The patient was 88% on RA... Pt was placed on [place] 2L

## 2023-07-26 NOTE — TOC Initial Note (Signed)
 Transition of Care [facility]) - Initial/Assessment Note    Patient Details  Name: [name] MRN: [id number] Date of Birth: [birth year]  Transition of Care [facility]) CM/SW Contact:    Edsel DELENA Fischer, LCSW Phone Number: 07/26/2023, 3:00 PM  Clinical Narrative:                 SW met with pt at bedside at ED. Pt reports: Pt stated that she lives in [place], [place] with her daughter-[name],[name] , whom pt also expressed is her HCPOA.  No safety concern reported. No SDOH needs identified nor expressed at this time.  PCP- Dr. Leavy and Pharmacy- [facility] (via [facility] or [facility] locally.  Pt stated that she received HH services in the past from [facility] (PT, OT).  DME in the home; walker, rollator, quad cane, bedside commode, shower chair, shower bench, CPAP and nebulizer.  Pt stated that she does not drive and that daughter  and son in law assists with transportation. Social supports: daughter and son in Social worker.  No DV identified or expressed.  Pt stated that she receives SSDI and saviors benefit. Total $1500    Barriers to Discharge: Continued Medical Work up   Patient Goals and CMS Choice     Choice offered to / list presented to : Patient (Pt stated that her preference is [facility] in [place], [place])      Expected Discharge Plan and Services In-house Referral: Clinical Social Work     Living arrangements for the past 2 months: Single Family Home                                      Prior Living Arrangements/Services Living arrangements for the past 2 months: Single Family Home Lives with:: Adult Children Patient language and need for interpreter reviewed:: Yes Do you feel safe going back to the place where you live?: Yes        Care giver support system in place?: Yes (comment) Current home services: DME (DME: walker, rollator, quad cane, bedside commode, shower chair, shower bench)    Activities of Daily Living      Permission Sought/Granted                   Emotional Assessment Appearance:: Appears stated age Attitude/Demeanor/Rapport: Engaged Affect (typically observed): Appropriate Orientation: : Oriented to Situation, Oriented to  Time, Oriented to Place, Oriented to Self   Psych Involvement: No (comment)  Admission diagnosis:  Near syncope [R55] Patient Active Problem List   Diagnosis Date Noted   Near syncope 07/25/2023   Medication management 05/13/2023   Class 2 severe obesity with serious comorbidity and body mass index (BMI) of 39.0 to 39.9 in adult (HCC) 03/15/2023   Uncontrolled type 2 diabetes mellitus with hyperglycemia (HCC) 11/17/2021   Mild episode of recurrent major depressive disorder (HCC) 08/14/2021   Hypertensive urgency 04/23/2021   GERD without esophagitis 04/23/2021   Chronic diastolic CHF (congestive heart failure) (HCC) 04/23/2021   Cervical spondylosis 02/12/2021   Lumbar facet arthropathy 02/12/2021   Major depressive disorder, recurrent episode, moderate (HCC) 11/13/2020   Adrenal adenoma, right    PAD (peripheral artery disease) (HCC) 01/24/2020   CAD (coronary artery disease) 04/27/2019   Vitamin D  deficiency 01/24/2018   Overactive bladder 05/18/2017   Dilated aortic root (HCC)    Sacroiliac dysfunction 10/09/2014   Chronic low back pain 09/03/2014   Hemiparesis  affecting left side as late effect of stroke (HCC) 09/03/2014   OSA (obstructive sleep apnea) 05/15/2014   Dyslipidemia associated with type 2 diabetes mellitus (HCC) 01/21/2014   Essential hypertension, benign 01/21/2014   Chronic pain syndrome 01/21/2014   Headache disorder 09/07/2013   Mixed urge and stress incontinence 08/25/2011   BLINDNESS, LEGAL, [place]  DEFINITION 09/14/2006   Seizure (HCC) 09/13/2006   PCP:  [name], [name] Pharmacy:   [facility]) - [place], [place] - [address] [facility] [address] [place] [address] [place] [address] Phone: [phone] Fax: [phone]  [facility]  Delivery - [place], [place] - [address] [place] [address] Phone: [phone] Fax: [phone]     Social Drivers of Health (SDOH) Social History: SDOH Screenings   Food Insecurity: No Food Insecurity (04/26/2023)  Housing: Low Risk  (04/26/2023)  Transportation Needs: No Transportation Needs (04/26/2023)  Utilities: Not At Risk (04/26/2023)  Alcohol  Screen: Low Risk  (04/01/2023)  Depression (PHQ2-9): Low Risk  (06/15/2023)  Financial Resource Strain: Low Risk  (04/01/2023)  Physical Activity: Insufficiently Active (04/01/2023)  Social Connections: Moderately Isolated (04/01/2023)  Stress: No Stress Concern Present (04/01/2023)  Tobacco Use: Medium Risk (07/25/2023)  Health Literacy: Adequate Health Literacy (04/01/2023)   SDOH Interventions:     Readmission Risk Interventions    06/11/2021   11:43 AM 05/19/2021   12:02 PM 04/24/2021    3:02 PM  Readmission Risk Prevention Plan  Transportation Screening Complete Complete Complete  PCP or Specialist Appt within 5-7 Days  Complete   Home Care Screening  Complete   Medication Review (RN CM)  Complete   HRI or Home Care Consult   Complete  Social Work Consult for Recovery Care Planning/Counseling   --  Palliative Care Screening   Not Applicable  Medication Review Oceanographer) Complete  Complete  PCP or Specialist appointment within 3-5 days of discharge Complete    HRI or Home Care Consult Complete    SW Recovery Care/Counseling Consult Complete    Palliative Care Screening Not Applicable    Skilled Nursing Facility Not Applicable

## 2023-07-27 ENCOUNTER — Telehealth: Admitting: "Endocrinology

## 2023-07-27 ENCOUNTER — Ambulatory Visit: Admitting: "Endocrinology

## 2023-07-27 DIAGNOSIS — R55 Syncope and collapse: Secondary | ICD-10-CM | POA: Diagnosis not present

## 2023-07-27 DIAGNOSIS — I70209 Unspecified atherosclerosis of native arteries of extremities, unspecified extremity: Secondary | ICD-10-CM | POA: Diagnosis not present

## 2023-07-27 DIAGNOSIS — J449 Chronic obstructive pulmonary disease, unspecified: Secondary | ICD-10-CM | POA: Diagnosis not present

## 2023-07-27 DIAGNOSIS — Y92009 Unspecified place in unspecified non-institutional (private) residence as the place of occurrence of the external cause: Secondary | ICD-10-CM

## 2023-07-27 DIAGNOSIS — K219 Gastro-esophageal reflux disease without esophagitis: Secondary | ICD-10-CM | POA: Diagnosis not present

## 2023-07-27 DIAGNOSIS — I69354 Hemiplegia and hemiparesis following cerebral infarction affecting left non-dominant side: Secondary | ICD-10-CM | POA: Diagnosis not present

## 2023-07-27 DIAGNOSIS — W19XXXA Unspecified fall, initial encounter: Secondary | ICD-10-CM | POA: Diagnosis not present

## 2023-07-27 DIAGNOSIS — E1140 Type 2 diabetes mellitus with diabetic neuropathy, unspecified: Secondary | ICD-10-CM | POA: Diagnosis not present

## 2023-07-27 DIAGNOSIS — G8929 Other chronic pain: Secondary | ICD-10-CM

## 2023-07-27 DIAGNOSIS — G4733 Obstructive sleep apnea (adult) (pediatric): Secondary | ICD-10-CM | POA: Diagnosis not present

## 2023-07-27 DIAGNOSIS — R54 Age-related physical debility: Secondary | ICD-10-CM | POA: Diagnosis not present

## 2023-07-27 DIAGNOSIS — I110 Hypertensive heart disease with heart failure: Secondary | ICD-10-CM | POA: Diagnosis not present

## 2023-07-27 DIAGNOSIS — I25119 Atherosclerotic heart disease of native coronary artery with unspecified angina pectoris: Secondary | ICD-10-CM | POA: Diagnosis not present

## 2023-07-27 DIAGNOSIS — I10 Essential (primary) hypertension: Secondary | ICD-10-CM | POA: Diagnosis not present

## 2023-07-27 DIAGNOSIS — E785 Hyperlipidemia, unspecified: Secondary | ICD-10-CM

## 2023-07-27 DIAGNOSIS — G40A09 Absence epileptic syndrome, not intractable, without status epilepticus: Secondary | ICD-10-CM

## 2023-07-27 DIAGNOSIS — R2681 Unsteadiness on feet: Secondary | ICD-10-CM | POA: Diagnosis not present

## 2023-07-27 DIAGNOSIS — Z794 Long term (current) use of insulin: Secondary | ICD-10-CM | POA: Diagnosis not present

## 2023-07-27 DIAGNOSIS — I5032 Chronic diastolic (congestive) heart failure: Secondary | ICD-10-CM

## 2023-07-27 DIAGNOSIS — S92352A Displaced fracture of fifth metatarsal bone, left foot, initial encounter for closed fracture: Secondary | ICD-10-CM | POA: Diagnosis not present

## 2023-07-27 DIAGNOSIS — I2510 Atherosclerotic heart disease of native coronary artery without angina pectoris: Secondary | ICD-10-CM | POA: Diagnosis not present

## 2023-07-27 DIAGNOSIS — E1165 Type 2 diabetes mellitus with hyperglycemia: Secondary | ICD-10-CM

## 2023-07-27 DIAGNOSIS — G894 Chronic pain syndrome: Secondary | ICD-10-CM | POA: Diagnosis not present

## 2023-07-27 DIAGNOSIS — E1169 Type 2 diabetes mellitus with other specified complication: Secondary | ICD-10-CM

## 2023-07-27 DIAGNOSIS — F17210 Nicotine dependence, cigarettes, uncomplicated: Secondary | ICD-10-CM | POA: Diagnosis not present

## 2023-07-27 LAB — GLUCOSE, CAPILLARY
Glucose-Capillary: 151 mg/dL — ABNORMAL HIGH (ref 70–99)
Glucose-Capillary: 268 mg/dL — ABNORMAL HIGH (ref 70–99)
Glucose-Capillary: 261 mg/dL — ABNORMAL HIGH (ref 70–99)
Glucose-Capillary: 218 mg/dL — ABNORMAL HIGH (ref 70–99)

## 2023-07-27 MED ORDER — INSULIN ASPART 100 UNIT/ML IJ SOLN
5.0000 [IU] | Freq: Three times a day (TID) | INTRAMUSCULAR | Status: DC
  Administered 2023-07-27 – 2023-07-29 (×6): 5 [IU] via SUBCUTANEOUS
  Filled 2023-07-27 (×6): qty 1

## 2023-07-27 NOTE — Assessment & Plan Note (Signed)
>>  ASSESSMENT AND PLAN FOR UNCONTROLLED TYPE 2 DIABETES MELLITUS WITH HYPERGLYCEMIA (HCC) WRITTEN ON 07/27/2023  2:06 PM BY AMIN, SUMAYYA, MD  Postprandial CBG elevated. - Adding 5 units with meal -Continue with Semglee  and SSI

## 2023-07-27 NOTE — Assessment & Plan Note (Signed)
 Acute mildly displaced fracture at the base of the fifth metatarsal.  All other imaging negative for any more bony abnormality.  PT and OT are recommending SNF -Podiatry was consulted and they were recommending cam boot and outpatient follow-up -TOC to work on placement

## 2023-07-27 NOTE — Inpatient Diabetes Management (Addendum)
 Inpatient Diabetes Program Recommendations  AACE/ADA: New Consensus Statement on Inpatient Glycemic Control (2015)  Target Ranges:  Prepandial:   less than 140 mg/dL      [facility] postprandial:   less than 180 mg/dL (1-2 hours)      Critically ill patients:  140 - 180 mg/dL   Lab Results  Component Value Date   GLUCAP 268 (H) 07/27/2023   HGBA1C 11.0 (A) 02/04/2023    Review of Glycemic Control  Latest Reference Range & Units 07/26/23 16:29 07/26/23 17:42 07/26/23 20:11 07/27/23 07:32 07/27/23 11:28  Glucose-Capillary 70 - 99 mg/dL 793 (H) 708 (H) 766 (H) 151 (H) 268 (H)   Diabetes history: DM 2 Outpatient Diabetes medications:  FSL3 sensor Trulicity  4.5 mg weekly Toujeo  50 units q HS Humalog  15 units tid with meals  Current orders for Inpatient glycemic control:  Novolog  0-15 units tid with meals and HS Semglee  40 units daily  Inpatient Diabetes Program Recommendations:    Please consider adding Novolog  5 units tid with meals (Hold if patient eats less than 50% or NPO).   Thanks,  Randall Bullocks, RN, BC-ADM Inpatient Diabetes Coordinator Pager [phone]  (8a-5p)

## 2023-07-27 NOTE — Assessment & Plan Note (Signed)
 Patient is on multiple pain medications which are being managed by outpatient pain specialist. - Continue home meds

## 2023-07-27 NOTE — Progress Notes (Signed)
 Progress Note   Patient: [name] FMW:[id number] DOB: 07-08-1965 DOA: 07/25/2023     0 DOS: the patient was seen and examined on 07/27/2023   Brief hospital course: Taken him H&P.   [name]  is a 58 y.o. female,  with history of previous abdominal surgery, previous SBO, obesity, coronary artery disease, GERD, heart failure preserved ejection fraction, uncontrolled type II diabetes mellitus, insulin -dependent type II diabetes mellitus presented to ED after having a syncopal episode resulted in fall causing pain in bilateral feet and ankle, unable to bear weight on left ankle.  Patient felt lightheadedness while walking towards her car and then passed out.  On presentation stable vitals, labs with glucose of 313, leukocytosis at 12.2. CT head with no acute abnormality.  Lower extremity imaging with left foot acute mildly displaced fracture at the base of fifth metatarsal, patient was given cam boot in ED.  7/7: Vitals with mildly elevated blood pressure, labs stable, having significant pain in left foot at fracture site.  Echocardiogram ordered to complete the syncopal workup. Imaging of R fibula was obtained as she was complaining of lower leg pain and it was negative for any acute fractures. Patient continued to complain about significant pain and unable to bear weight-PT and OT are recommending SNF.  Patient is medically stable for discharge.  Podiatry saw her and they are recommending weightbearing as tolerated, use cam boot with ambulation and outpatient follow-up in 2 to 3 weeks.  7/8: Hemodynamically stable, mildly elevated postprandial CBG so adding 5 units with meal.  Awaiting placement  Assessment and Plan: * Near syncope Symptoms preceded by dizziness and lightheadedness, orthostatic vitals were negative in ED.  Likely due to polypharmacy. CT head with no acute abnormality and EKG with no acute changes. Patient has an history of CVA with some residual left-sided  weakness. Echocardiogram with normal EF and grade 1 diastolic dysfunction.  No other significant abnormality. -Continue with supportive care  Fall at home, initial encounter Acute mildly displaced fracture at the base of the fifth metatarsal.  All other imaging negative for any more bony abnormality.  PT and OT are recommending SNF -Podiatry was consulted and they were recommending cam boot and outpatient follow-up -TOC to work on placement  Essential hypertension, benign Blood pressure currently within goal -Continue home amlodipine , carvedilol , Lasix  and hydralazine   Chronic diastolic CHF (congestive heart failure) (HCC) Repeat echocardiogram with grade 1 diastolic dysfunction and normal EF. -Continuing home Lasix   CAD (coronary artery disease) No chest pain.   - Continue home Plavix  and Crestor   GERD without esophagitis - Continue with PPI  Hemiparesis affecting left side as late effect of stroke [facility]) Patient has residual left-sided weakness from prior stroke.  No acute concern. - Continue home Plavix  and statin  Uncontrolled type 2 diabetes mellitus with hyperglycemia (HCC) Postprandial CBG elevated. - Adding 5 units with meal -Continue with Semglee  and SSI  Dyslipidemia associated with type 2 diabetes mellitus (HCC) - Continue with Zetia  and Crestor   OSA (obstructive sleep apnea) - CPAP at night  Chronic pain Patient is on multiple pain medications which are being managed by outpatient pain specialist. - Continue home meds  Class 2 severe obesity with serious comorbidity and body mass index (BMI) of 39.0 to 39.9 in adult [facility]) Estimated body mass index is 37.93 kg/m as calculated from the following:   Height as of this encounter: 5' 6 (1.676 m).   Weight as of this encounter: 106.6 kg.   This will  complicate overall prognosis- -encouraged weight loss  Epilepsy idiopathic petit mal (HCC) No acute concern. - Continue home Keppra    Subjective: Patient  continued to have pain and difficulty bearing weight with ambulation.  Physical Exam: Vitals:   07/26/23 1727 07/26/23 2009 07/27/23 0429 07/27/23 0717  BP: (!) 154/79 (!) 118/56 137/75 124/72  Pulse: 76 73 79 78  Resp: 18 18 18 18   Temp: 98.1 F (36.7 C) 97.9 F (36.6 C) 97.9 F (36.6 C) 97.9 F (36.6 C)  TempSrc: Oral  Oral Oral  SpO2: 100% 98% 94% 98%  Weight:      Height:       General.  Morbidly obese lady, in no acute distress. Pulmonary.  Lungs clear bilaterally, normal respiratory effort. CV.  Regular rate and rhythm, no JVD, rub or murmur. Abdomen.  Soft, nontender, nondistended, BS positive. CNS.  Alert and oriented .  No focal neurologic deficit. Extremities.  No edema, no cyanosis, pulses intact and symmetrical. Psychiatry.  Judgment and insight appears normal.   Data Reviewed: Prior data reviewed  Family Communication: Discussed with patient  Disposition: Status is: Observation The patient remains OBS appropriate and will d/c before 2 midnights.  Planned Discharge Destination: Skilled nursing facility  Time spent: 45 minutes  This record has been created using Conservation officer, historic buildings. Errors have been sought and corrected,but may not always be located. Such creation errors do not reflect on the standard of care.   Author: Amaryllis Dare, MD 07/27/2023 2:08 PM  For on call review [email or web address].

## 2023-07-27 NOTE — Assessment & Plan Note (Signed)
 Postprandial CBG elevated. - Adding 5 units with meal -Continue with Semglee  and SSI

## 2023-07-27 NOTE — Care Management Obs Status (Signed)
 MEDICARE OBSERVATION STATUS NOTIFICATION   Patient Details  Name: [name] MRN: [id number] Date of Birth: May 01, 1965   Medicare Observation Status Notification Given:  Yes    Jamaris Theard W, CMA 07/27/2023, 10:07 AM

## 2023-07-27 NOTE — Assessment & Plan Note (Signed)
 Blood pressure currently within goal -Continue home amlodipine , carvedilol , Lasix  and hydralazine 

## 2023-07-27 NOTE — Assessment & Plan Note (Signed)
 Repeat echocardiogram with grade 1 diastolic dysfunction and normal EF. -Continuing home Lasix 

## 2023-07-27 NOTE — Assessment & Plan Note (Signed)
 Continue with PPI

## 2023-07-27 NOTE — TOC Progression Note (Signed)
 Transition of Care [facility]) - Progression Note    Patient Details  Name: [name] MRN: [id number] Date of Birth: [birth year]  Transition of Care [facility]) CM/SW Contact  [place], [place] Phone Number: 07/27/2023, 4:21 PM  Clinical Narrative:     CSW spoke with patient and advised of bed offer at [facility]. Patient accepted bed offer. Auth started.     Barriers to Discharge: Continued Medical Work up  Expected Discharge Plan and Services In-house Referral: Clinical Social Work     Living arrangements for the past 2 months: Single Family Home                                       Social Determinants of Health (SDOH) Interventions SDOH Screenings   Food Insecurity: No Food Insecurity (07/26/2023)  Housing: Low Risk  (07/26/2023)  Transportation Needs: No Transportation Needs (07/26/2023)  Utilities: Not At Risk (07/26/2023)  Alcohol  Screen: Low Risk  (04/01/2023)  Depression (PHQ2-9): Low Risk  (06/15/2023)  Financial Resource Strain: Low Risk  (04/01/2023)  Physical Activity: Insufficiently Active (04/01/2023)  Social Connections: Moderately Isolated (04/01/2023)  Stress: No Stress Concern Present (04/01/2023)  Tobacco Use: Medium Risk (07/25/2023)  Health Literacy: Adequate Health Literacy (04/01/2023)    Readmission Risk Interventions    06/11/2021   11:43 AM 05/19/2021   12:02 PM 04/24/2021    3:02 PM  Readmission Risk Prevention Plan  Transportation Screening Complete Complete Complete  PCP or Specialist Appt within 5-7 Days  Complete   Home Care Screening  Complete   Medication Review (RN CM)  Complete   HRI or Home Care Consult   Complete  Social Work Consult for Recovery Care Planning/Counseling   --  Palliative Care Screening   Not Applicable  Medication Review Oceanographer) Complete  Complete  PCP or Specialist appointment within 3-5 days of discharge Complete    HRI or Home Care Consult Complete    SW Recovery Care/Counseling Consult Complete     Palliative Care Screening Not Applicable    Skilled Nursing Facility Not Applicable

## 2023-07-27 NOTE — Assessment & Plan Note (Signed)
-   Continue with Zetia  and Crestor

## 2023-07-27 NOTE — Assessment & Plan Note (Signed)
 No chest pain.   - Continue home Plavix  and Crestor 

## 2023-07-27 NOTE — Assessment & Plan Note (Signed)
 No acute concern. - Continue home Keppra 

## 2023-07-27 NOTE — Assessment & Plan Note (Signed)
 Estimated body mass index is 37.93 kg/m as calculated from the following:   Height as of this encounter: 5' 6 (1.676 m).   Weight as of this encounter: 106.6 kg.   This will complicate overall prognosis- -encouraged weight loss

## 2023-07-27 NOTE — Plan of Care (Signed)
   Problem: Education: Goal: Ability to describe self-care measures that may prevent or decrease complications (Diabetes Survival Skills Education) will improve Outcome: Progressing

## 2023-07-27 NOTE — Assessment & Plan Note (Signed)
 -  CPAP at night

## 2023-07-27 NOTE — Assessment & Plan Note (Signed)
>>  ASSESSMENT AND PLAN FOR CHRONIC PAIN WRITTEN ON 07/27/2023  2:07 PM BY AMIN, SUMAYYA, MD  Patient is on multiple pain medications which are being managed by outpatient pain specialist. - Continue home meds

## 2023-07-27 NOTE — Progress Notes (Signed)
 Physical Therapy Treatment Patient Details Name: [name] MRN: [id number] DOB: Jan 22, 1965 Today's Date: 07/27/2023   History of Present Illness [name] is a 57yoF who [facility] on 07/25/23 after fall due to dizziness, subsequent ankle pain- pt was walking to car to go to church when she became dizzy and passed out. x-ray revealing of left foot displaced fracture at the base of the fifth metatarsal. Podiatry saw her and they are recommending weightbearing as tolerated, use cam boot with ambulation and outpatient follow-up in 2 to 3 weeks.   Pt unable to bear weight on Left foot. PMH: SBO, CAD, GERD, HFpEF, CVA with residual.    PT Comments  Pt now on orthopedic unit since ED evaluation. TotalA provided for CAM rocker boot donning- analgesia coordinated with RN prior to arrival. Pt requires supervision and elevated surface height for performing transfers, rising to standing. Pt able to advance AMB to 33ft, then after a break again to 86ft, still limited by pain levels, fatigue, and fluctuating dizziness. Pt set up in recliner at EOS as requested, all needs met. Will continue to follow and advance mobility as appropriate.    If plan is discharge home, recommend the following: A lot of help with walking and/or transfers;Help with stairs or ramp for entrance;Assist for transportation;Assistance with cooking/housework;Two people to help with bathing/dressing/bathroom   Can travel by private vehicle     Yes  Equipment Recommendations  Wheelchair (measurements PT);Wheelchair cushion (measurements PT)    Recommendations for Other Services       Precautions / Restrictions Precautions Precautions: Fall Recall of Precautions/Restrictions: Intact Other Brace: CAM boot L foot Restrictions LLE Weight Bearing Per Provider Order: Weight bearing as tolerated Other Position/Activity Restrictions: in CAM boot; podiatry subsequently recommended postop shoe.     Mobility  Bed  Mobility Overal bed mobility: Modified Independent Bed Mobility: Supine to Sit     Supine to sit: Supervision, HOB elevated, Modified independent (Device/Increase time)     General bed mobility comments: to Rt EOB    Transfers Overall transfer level: Needs assistance Equipment used: Rolling walker (2 wheels) Transfers: Sit to/from Stand Sit to Stand: Supervision           General transfer comment: from elevated EOB, from recliner    Ambulation/Gait Ambulation/Gait assistance: Contact guard assist Gait Distance (Feet): 20 Feet (55ft, then 34ft after seated recovery interval) Assistive device: Rolling walker (2 wheels) Gait Pattern/deviations: Step-to pattern       General Gait Details: CAM boot donned; antalgic (1 LOB requires minA from Chartered loss adjuster)   Optometrist     Tilt Bed    Modified Rankin (Stroke Patients Only)       Horticulturist, commercial    Exercises Other Exercises Other Exercises: Seated LAQ 1x15 bilat Other Exercises: Seated marching 1x10 bilat Other Exercises: STS from elevated EOB x5 (supervision level assistance) Other Exercises: authro provides total assist CMA  rocker boot donning.    General Comments        Pertinent Vitals/Pain Pain Assessment Pain Assessment: 0-10 Pain Score: 9  Pain Location: L calf down to the toes Pain Descriptors / Indicators: Pressure, Stabbing, Operative site guarding Pain Intervention(s): Limited activity within patient's tolerance, Monitored during session, Premedicated before session    Home Living                          Prior Function            PT Goals (current goals can now be found in the care plan section) Acute Rehab PT Goals Patient Stated Goal: be able to walk again PT Goal Formulation: With patient Time For  Goal Achievement: 08/09/23 Potential to Achieve Goals: Good Progress towards PT goals: Progressing toward goals    Frequency    Min 3X/week      PT Plan      Co-evaluation              AM-PAC PT 6 Clicks Mobility   Outcome Measure  Help needed turning from your back to your side while in a flat bed without using bedrails?: A Little Help needed moving from lying on your back to sitting on the side of a flat bed without using bedrails?: A Little Help needed moving to and from a bed to a chair (including a wheelchair)?: A Little Help needed standing up from a chair using your arms (e.g., wheelchair or bedside chair)?: A Little Help needed to walk in hospital room?: A Little Help needed climbing 3-5 steps with a railing? : A Lot 6 Click Score: 17    End of Session   Activity Tolerance: Patient tolerated treatment well;Patient limited by pain;Patient limited by fatigue Patient left: in chair;with call bell/phone within reach;with chair alarm set Nurse Communication: Mobility status PT Visit Diagnosis: Difficulty in walking, not elsewhere classified (R26.2);Other abnormalities of gait and mobility (R26.89);Muscle weakness (generalized) (M62.81)     Time: 8752-8694 PT Time Calculation (min) (ACUTE ONLY): 18 min  Charges:    $Therapeutic Activity: 8-22 mins PT General Charges $$ ACUTE PT VISIT: 1 Visit                    1:14 PM, 07/27/23 Peggye JAYSON Linear, PT, DPT Physical Therapist - [facility]  [phone] (ASCOM)     Tim Corriher C 07/27/2023, 1:12 PM

## 2023-07-27 NOTE — Assessment & Plan Note (Signed)
 Patient has residual left-sided weakness from prior stroke.  No acute concern. - Continue home Plavix  and statin

## 2023-07-28 ENCOUNTER — Telehealth: Admitting: "Endocrinology

## 2023-07-28 ENCOUNTER — Other Ambulatory Visit: Payer: Self-pay | Admitting: Family Medicine

## 2023-07-28 DIAGNOSIS — R55 Syncope and collapse: Secondary | ICD-10-CM | POA: Diagnosis not present

## 2023-07-28 DIAGNOSIS — Z794 Long term (current) use of insulin: Secondary | ICD-10-CM | POA: Diagnosis not present

## 2023-07-28 DIAGNOSIS — I69354 Hemiplegia and hemiparesis following cerebral infarction affecting left non-dominant side: Secondary | ICD-10-CM | POA: Diagnosis not present

## 2023-07-28 DIAGNOSIS — E1140 Type 2 diabetes mellitus with diabetic neuropathy, unspecified: Secondary | ICD-10-CM | POA: Diagnosis not present

## 2023-07-28 DIAGNOSIS — E1165 Type 2 diabetes mellitus with hyperglycemia: Secondary | ICD-10-CM | POA: Diagnosis not present

## 2023-07-28 DIAGNOSIS — K219 Gastro-esophageal reflux disease without esophagitis: Secondary | ICD-10-CM | POA: Diagnosis not present

## 2023-07-28 DIAGNOSIS — G894 Chronic pain syndrome: Secondary | ICD-10-CM | POA: Diagnosis not present

## 2023-07-28 DIAGNOSIS — J449 Chronic obstructive pulmonary disease, unspecified: Secondary | ICD-10-CM | POA: Diagnosis not present

## 2023-07-28 DIAGNOSIS — F17210 Nicotine dependence, cigarettes, uncomplicated: Secondary | ICD-10-CM | POA: Diagnosis not present

## 2023-07-28 DIAGNOSIS — R2681 Unsteadiness on feet: Secondary | ICD-10-CM | POA: Diagnosis not present

## 2023-07-28 DIAGNOSIS — W19XXXA Unspecified fall, initial encounter: Secondary | ICD-10-CM | POA: Diagnosis not present

## 2023-07-28 DIAGNOSIS — S92352A Displaced fracture of fifth metatarsal bone, left foot, initial encounter for closed fracture: Secondary | ICD-10-CM | POA: Diagnosis not present

## 2023-07-28 DIAGNOSIS — R54 Age-related physical debility: Secondary | ICD-10-CM | POA: Diagnosis not present

## 2023-07-28 DIAGNOSIS — I70209 Unspecified atherosclerosis of native arteries of extremities, unspecified extremity: Secondary | ICD-10-CM | POA: Diagnosis not present

## 2023-07-28 DIAGNOSIS — I110 Hypertensive heart disease with heart failure: Secondary | ICD-10-CM | POA: Diagnosis not present

## 2023-07-28 DIAGNOSIS — Y92009 Unspecified place in unspecified non-institutional (private) residence as the place of occurrence of the external cause: Secondary | ICD-10-CM | POA: Diagnosis not present

## 2023-07-28 DIAGNOSIS — I2510 Atherosclerotic heart disease of native coronary artery without angina pectoris: Secondary | ICD-10-CM | POA: Diagnosis not present

## 2023-07-28 DIAGNOSIS — I5032 Chronic diastolic (congestive) heart failure: Secondary | ICD-10-CM | POA: Diagnosis not present

## 2023-07-28 DIAGNOSIS — G4733 Obstructive sleep apnea (adult) (pediatric): Secondary | ICD-10-CM | POA: Diagnosis not present

## 2023-07-28 LAB — GLUCOSE, CAPILLARY
Glucose-Capillary: 166 mg/dL — ABNORMAL HIGH (ref 70–99)
Glucose-Capillary: 185 mg/dL — ABNORMAL HIGH (ref 70–99)
Glucose-Capillary: 232 mg/dL — ABNORMAL HIGH (ref 70–99)
Glucose-Capillary: 240 mg/dL — ABNORMAL HIGH (ref 70–99)

## 2023-07-28 NOTE — Plan of Care (Signed)
   Problem: Fluid Volume: Goal: Ability to maintain a balanced intake and output will improve Outcome: Progressing

## 2023-07-28 NOTE — Progress Notes (Signed)
 Physical Therapy Treatment Patient Details Name: [name] MRN: [id number] DOB: 1965/10/18 Today's Date: 07/28/2023   History of Present Illness [name] is a 57yoF who comes to [facility] on 07/25/23 after fall due to dizziness, subsequent ankle pain- pt was walking to car to go to church when she became dizzy and passed out. x-ray revealing of left foot displaced fracture at the base of the fifth metatarsal. Podiatry saw her and they are recommending weightbearing as tolerated, use cam boot with ambulation and outpatient follow-up in 2 to 3 weeks.   Pt unable to bear weight on Left foot. PMH: SBO, CAD, GERD, HFpEF, CVA with residual.    PT Comments  Pt in chair on arrival, has been up much of day as preferred, also managing BSC transfers with NSG. Pt improved form pain standpoint, despite 10/10 appears more tolerated and comfortable. Pt appears to be moving better with short distance AMB in room, transfers as well, however she has repeat orthostatic dizziness in session that limits additional activity despite seated recovery. Seated and standing pressures unremarkable. Session ended a little early out of caution. Will continue to follow.    If plan is discharge home, recommend the following:     Can travel by private vehicle        Equipment Recommendations       Recommendations for Other Services       Precautions / Restrictions Precautions Precautions: Fall Recall of Precautions/Restrictions: Intact Other Brace: CAM boot L foot Restrictions LLE Weight Bearing Per Provider Order: Weight bearing as tolerated Other Position/Activity Restrictions: in CAM boot; podiatry subsequently recommended postop shoe.     Mobility  Bed Mobility                    Transfers Overall transfer level: Needs assistance Equipment used: Rolling walker (2 wheels) Transfers: Sit to/from Stand Sit to Stand: Supervision, From elevated surface           General transfer comment:  from elevated EOB, from recliner    Ambulation/Gait Ambulation/Gait assistance: Contact guard assist Gait Distance (Feet): 16 Feet Assistive device: Rolling walker (2 wheels) Gait Pattern/deviations: Step-to pattern Gait velocity: inititally moving well for first 10 ft, but becomes swimmy headed and we sit early. Pt remains swimmy headed with additional mobility attempts.     General Gait Details: CAM boot donned; antalgic   Stairs             Wheelchair Mobility     Tilt Bed    Modified Rankin (Stroke Patients Only)       Balance                                            Communication    Cognition                                        Cueing    Exercises Other Exercises Other Exercises: STS from elevated EOB x2 (supervision level assistance, 2nd one looks hypotonic and weak) Other Exercises: dino provides total assist CMA rocker boot donning.    General Comments        Pertinent Vitals/Pain Pain Assessment Pain Assessment: 0-10 Pain Score: 10-Worst pain ever Pain Location: L calf down to the toes Pain Intervention(s):  Limited activity within patient's tolerance, Monitored during session, Premedicated before session    Home Living                          Prior Function            PT Goals (current goals can now be found in the care plan section) Acute Rehab PT Goals Patient Stated Goal: be able to walk again PT Goal Formulation: With patient Time For Goal Achievement: 08/09/23 Potential to Achieve Goals: Good Progress towards PT goals: Progressing toward goals    Frequency    Min 3X/week      PT Plan      Co-evaluation              AM-PAC PT 6 Clicks Mobility   Outcome Measure                   End of Session   Activity Tolerance: Patient tolerated treatment well;Treatment limited secondary to medical complications (Comment) Patient left: in chair;with call  bell/phone within reach   PT Visit Diagnosis: Difficulty in walking, not elsewhere classified (R26.2);Other abnormalities of gait and mobility (R26.89);Muscle weakness (generalized) (M62.81)     Time: 8577-8560 PT Time Calculation (min) (ACUTE ONLY): 17 min  Charges:    $Gait Training: 8-22 mins PT General Charges $$ ACUTE PT VISIT: 1 Visit                    4:14 PM, 07/28/23 Peggye JAYSON Linear, PT, DPT Physical Therapist - [facility]  [phone] (ASCOM)    Hortencia Martire C 07/28/2023, 4:12 PM

## 2023-07-28 NOTE — Progress Notes (Signed)
 Progress Note   Patient: [name] FMW:[id number] DOB: 1965-03-11 DOA: 07/25/2023     0 DOS: the patient was seen and examined on 07/28/2023   Brief hospital course: Cadance Remsburg  is a 58 y.o. female,  with history of previous abdominal surgery, previous SBO, obesity, coronary artery disease, GERD, heart failure preserved ejection fraction, uncontrolled type II diabetes mellitus, insulin -dependent type II diabetes mellitus presented to ED after having a syncopal episode resulted in fall causing pain in bilateral feet and ankle, unable to bear weight on left ankle.  Patient felt lightheadedness while walking towards her car and then passed out. Workup showed ejection fraction 60 to 65%, grade 1 diastolic dysfunction, no valvular disease.  Telemetry did not show any arrhythmia. Patient also sustained a fracture in the left fifth metatarsal. Patient also seen by podiatry, no need for surgery.  Seen by PT, recommended nursing home placement.  Currently pending placement   Principal Problem:   Near syncope Active Problems:   Syncope   Fall at home, initial encounter   Essential hypertension, benign   Chronic diastolic CHF (congestive heart failure) (HCC)   CAD (coronary artery disease)   GERD without esophagitis   Hemiparesis affecting left side as late effect of stroke (HCC)   Uncontrolled type 2 diabetes mellitus with hyperglycemia (HCC)   Dyslipidemia associated with type 2 diabetes mellitus (HCC)   OSA (obstructive sleep apnea)   Chronic pain   Class 2 severe obesity with serious comorbidity and body mass index (BMI) of 39.0 to 39.9 in adult [facility])   Epilepsy idiopathic petit mal (HCC)   PAD (peripheral artery disease) (HCC)   Closed displaced fracture of fifth metatarsal bone of left foot   Assessment and Plan: Near syncope Patient remember falling, never lost consciousness.  She states that that she was walking to her car in a very hot weather, sweaty.  Then she fell  lightheaded and weak, could not sustain herself and fell on the ground. CT head with no acute abnormality and EKG with no acute changes. Patient has an history of CVA with some residual left-sided weakness. Echocardiogram with normal EF and grade 1 diastolic dysfunction.  No other significant abnormality. Patient no longer has any orthostatic hypotension.  No additional workup indicated.   Fall at home, initial encounter Acute mildly displaced fracture at the base of the fifth metatarsal.  All other imaging negative for any more bony abnormality.  PT and OT are recommending SNF -Podiatry was consulted and they were recommending cam boot and outpatient follow-up Currently pending nursing home placement   Essential hypertension, benign Continue all home medicines   Chronic diastolic CHF (congestive heart failure) (HCC) Repeat echocardiogram with grade 1 diastolic dysfunction and normal EF. Patient currently euvolemic  CAD (coronary artery disease) No chest pain.   - Continue home Plavix  and Crestor    GERD without esophagitis - Continue with PPI   Hemiparesis affecting left side as late effect of stroke [facility]) Patient has residual left-sided weakness from prior stroke.  No acute concern. - Continue home Plavix  and statin   Uncontrolled type 2 diabetes mellitus with hyperglycemia (HCC) Continue current dose of insulin .   Dyslipidemia associated with type 2 diabetes mellitus (HCC) - Continue with Zetia  and Crestor    OSA (obstructive sleep apnea) - CPAP at night   Chronic pain Patient is on multiple pain medications which are being managed by outpatient pain specialist. - Continue home meds   Class 2 severe obesity with serious comorbidity and  body mass index (BMI) of 39.0 to 39.9 in adult [facility]) Estimated body mass index is 37.93 kg/m as calculated from the following:   Height as of this encounter: 5' 6 (1.676 m).   Weight as of this encounter: 106.6 kg.  Diet and exercise  advised   Epilepsy idiopathic petit mal (HCC) No seizure before admission.  Stable, continue home medicines        Subjective:  Patient feels well today, no dizziness.  No shortness of breath  Physical Exam: Vitals:   07/27/23 1530 07/27/23 1946 07/28/23 0219 07/28/23 0747  BP: (!) 116/54 126/72 (!) 130/91 133/87  Pulse: 85 78 81 79  Resp: 19 18 18 15   Temp: 98.7 F (37.1 C) 98.1 F (36.7 C) 98.3 F (36.8 C) 98.1 F (36.7 C)  TempSrc: Oral   Oral  SpO2: 96% 95% 98% 98%  Weight:      Height:       General exam: Appears calm and comfortable, obese Respiratory system: Clear to auscultation. Respiratory effort normal. Cardiovascular system: S1 & S2 heard, RRR. No JVD, murmurs, rubs, gallops or clicks. No pedal edema. Gastrointestinal system: Abdomen is nondistended, soft and nontender. No organomegaly or masses felt. Normal bowel sounds heard. Central nervous system: Alert and oriented. No focal neurological deficits. Extremities: Symmetric 5 x 5 power. Skin: No rashes, lesions or ulcers Psychiatry: Judgement and insight appear normal. Mood & affect appropriate.    Data Reviewed:  Reviewed the CT scan results, echocardiogram results and lab results  Family Communication: None  Disposition: Status is: Observation      Time spent: 55 minutes  Author: Murvin Mana, MD 07/28/2023 11:02 AM  For on call review [email or web address].

## 2023-07-28 NOTE — Plan of Care (Signed)
   Problem: Education: Goal: Ability to describe self-care measures that may prevent or decrease complications (Diabetes Survival Skills Education) will improve Outcome: Progressing

## 2023-07-29 DIAGNOSIS — J449 Chronic obstructive pulmonary disease, unspecified: Secondary | ICD-10-CM | POA: Diagnosis not present

## 2023-07-29 DIAGNOSIS — I5032 Chronic diastolic (congestive) heart failure: Secondary | ICD-10-CM | POA: Diagnosis not present

## 2023-07-29 DIAGNOSIS — K219 Gastro-esophageal reflux disease without esophagitis: Secondary | ICD-10-CM | POA: Diagnosis not present

## 2023-07-29 DIAGNOSIS — I69354 Hemiplegia and hemiparesis following cerebral infarction affecting left non-dominant side: Secondary | ICD-10-CM | POA: Diagnosis not present

## 2023-07-29 DIAGNOSIS — R2681 Unsteadiness on feet: Secondary | ICD-10-CM | POA: Diagnosis not present

## 2023-07-29 DIAGNOSIS — G894 Chronic pain syndrome: Secondary | ICD-10-CM | POA: Diagnosis not present

## 2023-07-29 DIAGNOSIS — R54 Age-related physical debility: Secondary | ICD-10-CM | POA: Diagnosis not present

## 2023-07-29 DIAGNOSIS — E1165 Type 2 diabetes mellitus with hyperglycemia: Secondary | ICD-10-CM | POA: Diagnosis not present

## 2023-07-29 DIAGNOSIS — R55 Syncope and collapse: Secondary | ICD-10-CM | POA: Diagnosis not present

## 2023-07-29 DIAGNOSIS — I110 Hypertensive heart disease with heart failure: Secondary | ICD-10-CM | POA: Diagnosis not present

## 2023-07-29 DIAGNOSIS — F17210 Nicotine dependence, cigarettes, uncomplicated: Secondary | ICD-10-CM | POA: Diagnosis not present

## 2023-07-29 DIAGNOSIS — E1140 Type 2 diabetes mellitus with diabetic neuropathy, unspecified: Secondary | ICD-10-CM | POA: Diagnosis not present

## 2023-07-29 DIAGNOSIS — G4733 Obstructive sleep apnea (adult) (pediatric): Secondary | ICD-10-CM | POA: Diagnosis not present

## 2023-07-29 DIAGNOSIS — S92352A Displaced fracture of fifth metatarsal bone, left foot, initial encounter for closed fracture: Secondary | ICD-10-CM | POA: Diagnosis not present

## 2023-07-29 DIAGNOSIS — I70209 Unspecified atherosclerosis of native arteries of extremities, unspecified extremity: Secondary | ICD-10-CM | POA: Diagnosis not present

## 2023-07-29 DIAGNOSIS — I2510 Atherosclerotic heart disease of native coronary artery without angina pectoris: Secondary | ICD-10-CM | POA: Diagnosis not present

## 2023-07-29 DIAGNOSIS — Z794 Long term (current) use of insulin: Secondary | ICD-10-CM | POA: Diagnosis not present

## 2023-07-29 LAB — GLUCOSE, CAPILLARY
Glucose-Capillary: 261 mg/dL — ABNORMAL HIGH (ref 70–99)
Glucose-Capillary: 217 mg/dL — ABNORMAL HIGH (ref 70–99)
Glucose-Capillary: 248 mg/dL — ABNORMAL HIGH (ref 70–99)
Glucose-Capillary: 269 mg/dL — ABNORMAL HIGH (ref 70–99)

## 2023-07-29 MED ORDER — INSULIN GLARGINE-YFGN 100 UNIT/ML ~~LOC~~ SOLN
50.0000 [IU] | Freq: Every day | SUBCUTANEOUS | Status: DC
  Administered 2023-07-29: 50 [IU] via SUBCUTANEOUS
  Filled 2023-07-29 (×2): qty 0.5

## 2023-07-29 MED ORDER — SENNOSIDES-DOCUSATE SODIUM 8.6-50 MG PO TABS
2.0000 | ORAL_TABLET | Freq: Two times a day (BID) | ORAL | Status: DC
  Administered 2023-07-29: 2 via ORAL
  Filled 2023-07-29 (×2): qty 2

## 2023-07-29 MED ORDER — INSULIN ASPART 100 UNIT/ML IJ SOLN
8.0000 [IU] | Freq: Three times a day (TID) | INTRAMUSCULAR | Status: DC
  Administered 2023-07-29 – 2023-07-30 (×2): 8 [IU] via SUBCUTANEOUS
  Filled 2023-07-29 (×2): qty 1

## 2023-07-29 NOTE — Progress Notes (Signed)
 Occupational Therapy Treatment Patient Details Name: [name] MRN: [id number] DOB: Dec 16, 1965 Today's Date: 07/29/2023   History of present illness [name] is a 57yoF who comes to [facility] on 07/25/23 after fall due to dizziness, subsequent ankle pain- pt was walking to car to go to church when she became dizzy and passed out. x-ray revealing of left foot displaced fracture at the base of the fifth metatarsal. Podiatry saw her and they are recommending weightbearing as tolerated, use cam boot with ambulation and outpatient follow-up in 2 to 3 weeks.   Pt unable to bear weight on Left foot. PMH: SBO, CAD, GERD, HFpEF, CVA with residual.   OT comments  Pt is supine in bed on arrival. Easily arousable and agreeable to OT session. She continues to have LLE pain at 9/10 and endorses fatigue, but is agreeable to short session. Pt performed bed mobility with MOD I. Pt required CGA to Min A with fatigue with STS from EOB. Max A to don CAM boot, but pt able to doff it and adjust bil socks with supervision seated at EOB.  Simulated LB dressing tasks in standing at RW with Min/CGA needed for safety. Pt ambulated ~5 ft before wishing to stop. Agreeable to 5 reps x 3 sets of chair push ups to maximize ease and carryover to STS transfers/anterior weight shift and strengthening.  Pt returned to bed with all needs in place and will cont to require skilled acute OT services to maximize her safety and IND to return to PLOF.       If plan is discharge home, recommend the following:  A lot of help with walking and/or transfers;A lot of help with bathing/dressing/bathroom;Assist for transportation;Assistance with cooking/housework;Help with stairs or ramp for entrance   Equipment Recommendations  Other (comment) (defer to next venue of care)    Recommendations for Other Services      Precautions / Restrictions Precautions Precautions: Fall Recall of Precautions/Restrictions: Intact Other Brace: CAM  boot L foot Restrictions LLE Weight Bearing Per Provider Order: Weight bearing as tolerated Other Position/Activity Restrictions: in CAM boot; podiatry subsequently recommended postop shoe.       Mobility Bed Mobility Overal bed mobility: Modified Independent             General bed mobility comments: to R side of bed    Transfers Overall transfer level: Needs assistance Equipment used: Rolling walker (2 wheels) Transfers: Sit to/from Stand Sit to Stand: Contact guard assist, Min assist           General transfer comment: Min A from EOB to RW with fatigue, CGA for first stand with increased time/effort; able to walk ~5 ft before requesting to return to seated EOB     Balance Overall balance assessment: Needs assistance Sitting-balance support: Feet unsupported, No upper extremity supported, Single extremity supported Sitting balance-Leahy Scale: Good Sitting balance - Comments: steady seated EOB for LB dressing management   Standing balance support: Bilateral upper extremity supported, Reliant on assistive device for balance Standing balance-Leahy Scale: Poor Standing balance comment: UE reliance on RW                           ADL either performed or assessed with clinical judgement   ADL Overall ADL's : Needs assistance/impaired                     Lower Body Dressing: Supervision/safety;Sitting/lateral leans;Contact guard assist;Minimal assistance Lower Body  Dressing Details (indicate cue type and reason): Max A to don CAM boot, able to remove it on her own and adjust bil socks with supervision seated at EOB; simulated pulling pants/underwear up in standing with Min/CGA             Functional mobility during ADLs: Rolling walker (2 wheels);Contact guard assist      Extremity/Trunk Assessment              Vision       Perception     Praxis     Communication Communication Communication: No apparent difficulties    Cognition Arousal: Alert Behavior During Therapy: WFL for tasks assessed/performed Cognition: No apparent impairments                               Following commands: Intact        Cueing   Cueing Techniques: Verbal cues  Exercises Other Exercises Other Exercises: Performed 5 reps x 3 sets of chair push ups from EOB to clear buttocks and improve STS ease while maximizing strength.    Shoulder Instructions       General Comments limited by pain to LLE and fatigue this date    Pertinent Vitals/ Pain       Pain Assessment Pain Assessment: 0-10 Pain Score: 9  Pain Location: LLE/foot Pain Descriptors / Indicators: Pressure, Stabbing, Operative site guarding Pain Intervention(s): Limited activity within patient's tolerance, Monitored during session, Repositioned  Home Living                                          Prior Functioning/Environment              Frequency  Min 2X/week        Progress Toward Goals  OT Goals(current goals can now be found in the care plan section)  Progress towards OT goals: Progressing toward goals  Acute Rehab OT Goals Patient Stated Goal: go to rehab OT Goal Formulation: With patient Time For Goal Achievement: 08/09/23 Potential to Achieve Goals: Good ADL Goals Pt Will Perform Lower Body Bathing: with supervision;sitting/lateral leans;sit to/from stand Pt Will Perform Lower Body Dressing: with supervision;sit to/from stand;sitting/lateral leans Pt Will Transfer to Toilet: with supervision;ambulating;regular height toilet;bedside commode  Plan      Co-evaluation                 AM-PAC OT 6 Clicks Daily Activity     Outcome Measure   Help from another person eating meals?: None Help from another person taking care of personal grooming?: None Help from another person toileting, which includes using toliet, bedpan, or urinal?: A Little Help from another person bathing (including  washing, rinsing, drying)?: A Little Help from another person to put on and taking off regular upper body clothing?: None Help from another person to put on and taking off regular lower body clothing?: A Little 6 Click Score: 21    End of Session Equipment Utilized During Treatment: Rolling walker (2 wheels)  OT Visit Diagnosis: Other abnormalities of gait and mobility (R26.89);Repeated falls (R29.6);Muscle weakness (generalized) (M62.81);Pain Pain - Right/Left: Left Pain - part of body: Leg;Ankle and joints of foot   Activity Tolerance Patient limited by pain;Patient tolerated treatment well   Patient Left in bed;with call bell/phone within reach;with bed alarm set  Nurse Communication Mobility status        Time: 8551-8493 OT Time Calculation (min): 18 min  Charges: OT General Charges $OT Visit: 1 Visit OT Treatments $Therapeutic Activity: 8-22 mins  Gari Hartsell, OTR/L  07/29/23, 4:07 PM   [name] 07/29/2023, 4:05 PM

## 2023-07-29 NOTE — Progress Notes (Signed)
 Progress Note   Patient: [name] FMW:[id number] DOB: August 03, 1965 DOA: 07/25/2023     0 DOS: the patient was seen and examined on 07/29/2023   Brief hospital course: Adana Eckmann  is a 58 y.o. female,  with history of previous abdominal surgery, previous SBO, obesity, coronary artery disease, GERD, heart failure preserved ejection fraction, uncontrolled type II diabetes mellitus, insulin -dependent type II diabetes mellitus presented to ED after having a syncopal episode resulted in fall causing pain in bilateral feet and ankle, unable to bear weight on left ankle.  Patient felt lightheadedness while walking towards her car and then passed out. Workup showed ejection fraction 60 to 65%, grade 1 diastolic dysfunction, no valvular disease.  Telemetry did not show any arrhythmia. Patient also sustained a fracture in the left fifth metatarsal. Patient also seen by podiatry, no need for surgery.  Seen by PT, recommended nursing home placement.  Currently pending placement   Principal Problem:   Near syncope Active Problems:   Syncope   Fall at home, initial encounter   Essential hypertension, benign   Chronic diastolic CHF (congestive heart failure) (HCC)   CAD (coronary artery disease)   GERD without esophagitis   Hemiparesis affecting left side as late effect of stroke (HCC)   Uncontrolled type 2 diabetes mellitus with hyperglycemia (HCC)   Dyslipidemia associated with type 2 diabetes mellitus (HCC)   OSA (obstructive sleep apnea)   Chronic pain   Class 2 severe obesity with serious comorbidity and body mass index (BMI) of 39.0 to 39.9 in adult [facility])   Epilepsy idiopathic petit mal (HCC)   PAD (peripheral artery disease) (HCC)   Closed displaced fracture of fifth metatarsal bone of left foot   Assessment and Plan: Near syncope Patient remember falling, never lost consciousness.  She states that that she was walking to her car in a very hot weather, sweaty.  Then she fell  lightheaded and weak, could not sustain herself and fell on the ground. CT head with no acute abnormality and EKG with no acute changes. Patient has an history of CVA with some residual left-sided weakness. Echocardiogram with normal EF and grade 1 diastolic dysfunction.  No other significant abnormality. Patient is working with physical therapy, no dizziness.  Condition resolved.   Fall at home, initial encounter Acute mildly displaced fracture at the base of the fifth metatarsal.  All other imaging negative for any more bony abnormality.  PT and OT are recommending SNF -Podiatry was consulted and they were recommending cam boot and outpatient follow-up Continue pain medicine and PT/OT.   Essential hypertension, benign Blood pressure stable on home medicines   Chronic diastolic CHF (congestive heart failure) (HCC) Repeat echocardiogram with grade 1 diastolic dysfunction and normal EF. Stable without volume overload.   CAD (coronary artery disease) No chest pain.   - Continue home Plavix  and Crestor    GERD without esophagitis - Continue with PPI   Hemiparesis affecting left side as late effect of stroke [facility]) Patient has residual left-sided weakness from prior stroke.  No acute concern. - Continue home Plavix  and statin   Uncontrolled type 2 diabetes mellitus with hyperglycemia (HCC) .  Elevated glucose, increase dose of insulin    Dyslipidemia associated with type 2 diabetes mellitus (HCC) - Continue with Zetia  and Crestor    OSA (obstructive sleep apnea) - CPAP at night   Chronic pain Patient is on multiple pain medications which are being managed by outpatient pain specialist. - Continue home meds   Class 2  severe obesity with serious comorbidity and body mass index (BMI) of 39.0 to 39.9 in adult [facility]) Estimated body mass index is 37.93 kg/m as calculated from the following:   Height as of this encounter: 5' 6 (1.676 m).   Weight as of this encounter: 106.6 kg.  Diet  and exercise advised   Epilepsy idiopathic petit mal (HCC) No seizure before admission.  Stable, continue home medicines          Subjective:  Patient currently doing well, good appetite.  No nausea vomiting.  Physical Exam: Vitals:   07/28/23 1547 07/28/23 1946 07/29/23 0431 07/29/23 0805  BP: 128/71 113/69 (!) 155/80 135/86  Pulse: 75 78 85 79  Resp: 17 18 17 20   Temp: 98.2 F (36.8 C) 98.2 F (36.8 C) 98.2 F (36.8 C) 98.3 F (36.8 C)  TempSrc: Oral   Oral  SpO2: 98% 96% 100% 97%  Weight:      Height:       General exam: Appears calm and comfortable  Respiratory system: Clear to auscultation. Respiratory effort normal. Cardiovascular system: S1 & S2 heard, RRR. No JVD, murmurs, rubs, gallops or clicks. No pedal edema. Gastrointestinal system: Abdomen is nondistended, soft and nontender. No organomegaly or masses felt. Normal bowel sounds heard. Central nervous system: Alert and oriented. No focal neurological deficits. Extremities: Symmetric 5 x 5 power. Skin: No rashes, lesions or ulcers Psychiatry: Judgement and insight appear normal. Mood & affect appropriate.    Data Reviewed:  Lab results reviewed.  Family Communication: None  Disposition: Status is: Observation      Time spent: 35 minutes  Author: Murvin Mana, MD 07/29/2023 3:26 PM  For on call review [email or web address].

## 2023-07-29 NOTE — Progress Notes (Signed)
 Physical Therapy Treatment Patient Details Name: [name] MRN: [id number] DOB: February 11, 1965 Today's Date: 07/29/2023   History of Present Illness [name] is a 57yoF who comes to [facility] on 07/25/23 after fall due to dizziness, subsequent ankle pain- pt was walking to car to go to church when she became dizzy and passed out. x-ray revealing of left foot displaced fracture at the base of the fifth metatarsal. Podiatry saw her and they are recommending weightbearing as tolerated, use cam boot with ambulation and outpatient follow-up in 2 to 3 weeks.   Pt unable to bear weight on Left foot. PMH: SBO, CAD, GERD, HFpEF, CVA with residual.    PT Comments  Pt able to AMB 2 bouts of 89ft with RW, 2 minutes seated recovery between, no LOB with RW. MinguardA transfers and AMB with RW, chair follow used today due to unstable dizzy symptoms last 3 days. Left foot pain 9/10, also has intense HA, premedicated before session. Will continue to follow.     If plan is discharge home, recommend the following: A lot of help with walking and/or transfers;Help with stairs or ramp for entrance;Assist for transportation;Assistance with cooking/housework;Two people to help with bathing/dressing/bathroom   Can travel by private vehicle     Yes  Equipment Recommendations  Wheelchair (measurements PT);Wheelchair cushion (measurements PT)    Recommendations for Other Services       Precautions / Restrictions Precautions Precautions: Fall Recall of Precautions/Restrictions: Intact Other Brace: CAM boot L foot Restrictions LLE Weight Bearing Per Provider Order: Weight bearing as tolerated Other Position/Activity Restrictions: in CAM boot; podiatry subsequently recommended postop shoe.     Mobility  Bed Mobility                    Transfers Overall transfer level: Needs assistance Equipment used: Rolling walker (2 wheels) Transfers: Sit to/from Stand Sit to Stand: Contact guard assist            General transfer comment: from recliner twice    Ambulation/Gait Ambulation/Gait assistance: Contact guard assist Gait Distance (Feet): 18 Feet (twice) Assistive device: Rolling walker (2 wheels) Gait Pattern/deviations: Step-to pattern Gait velocity: chair follow; no dizziness today. has intense HA     General Gait Details: CAM boot donned; antalgic   Stairs             Wheelchair Mobility     Tilt Bed    Modified Rankin (Stroke Patients Only)       Balance                                            Communication    Cognition                                        Cueing    Exercises      General Comments        Pertinent Vitals/Pain Pain Assessment Pain Assessment: 0-10 Pain Score: 9  Pain Location: L calf down to the toes Pain Descriptors / Indicators: Pressure, Stabbing, Operative site guarding Pain Intervention(s): Limited activity within patient's tolerance, Monitored during session, Premedicated before session, Repositioned, Patient requesting pain meds-RN notified    Home Living  Prior Function            PT Goals (current goals can now be found in the care plan section) Acute Rehab PT Goals Patient Stated Goal: be able to walk again PT Goal Formulation: With patient Time For Goal Achievement: 08/09/23 Potential to Achieve Goals: Good Progress towards PT goals: Progressing toward goals    Frequency    Min 3X/week      PT Plan      Co-evaluation              AM-PAC PT 6 Clicks Mobility   Outcome Measure  Help needed turning from your back to your side while in a flat bed without using bedrails?: A Little Help needed moving from lying on your back to sitting on the side of a flat bed without using bedrails?: A Little Help needed moving to and from a bed to a chair (including a wheelchair)?: A Little Help needed standing up from a chair  using your arms (e.g., wheelchair or bedside chair)?: A Little Help needed to walk in hospital room?: A Little Help needed climbing 3-5 steps with a railing? : A Lot 6 Click Score: 17    End of Session   Activity Tolerance: Patient tolerated treatment well;Treatment limited secondary to medical complications (Comment) Patient left: in chair;with call bell/phone within reach Nurse Communication: Mobility status PT Visit Diagnosis: Difficulty in walking, not elsewhere classified (R26.2);Other abnormalities of gait and mobility (R26.89);Muscle weakness (generalized) (M62.81)     Time: 8944-8888 PT Time Calculation (min) (ACUTE ONLY): 16 min  Charges:    $Gait Training: 8-22 mins PT General Charges $$ ACUTE PT VISIT: 1 Visit                    11:19 AM, 07/29/23 Peggye JAYSON Linear, PT, DPT Physical Therapist - [facility]  Outpatient Physical Therapy- Main Campus [phone]     Female Minish C 07/29/2023, 11:18 AM

## 2023-07-29 NOTE — Plan of Care (Signed)

## 2023-07-29 NOTE — TOC Progression Note (Addendum)
 Transition of Care [facility]) - Progression Note    Patient Details  Name: [name] MRN: [id number] Date of Birth: [birth year]  Transition of [facility]) CM/SW Contact  [place] L Mallarie Voorhies, [place] Phone Number: 07/29/2023, 4:01 PM  Clinical Narrative:     Peer2Peer offer until today 07/29/23 at 4:30 pm. Dr. Laurita notified.   4:16pm: Peer to peer complete. Patient auth approved. CSW spoke with Tammy at [facility]. Patient can be transferred tomorrow.    Barriers to Discharge: Continued Medical Work up  Expected Discharge Plan and Services In-house Referral: Clinical Social Work     Living arrangements for the past 2 months: Single Family Home                                       Social Determinants of Health (SDOH) Interventions SDOH Screenings   Food Insecurity: No Food Insecurity (07/26/2023)  Housing: Low Risk  (07/26/2023)  Transportation Needs: No Transportation Needs (07/26/2023)  Utilities: Not At Risk (07/26/2023)  Alcohol  Screen: Low Risk  (04/01/2023)  Depression (PHQ2-9): Low Risk  (06/15/2023)  Financial Resource Strain: Low Risk  (04/01/2023)  Physical Activity: Insufficiently Active (04/01/2023)  Social Connections: Moderately Isolated (04/01/2023)  Stress: No Stress Concern Present (04/01/2023)  Tobacco Use: Medium Risk (07/25/2023)  Health Literacy: Adequate Health Literacy (04/01/2023)    Readmission Risk Interventions    06/11/2021   11:43 AM 05/19/2021   12:02 PM 04/24/2021    3:02 PM  Readmission Risk Prevention Plan  Transportation Screening Complete Complete Complete  PCP or Specialist Appt within 5-7 Days  Complete   Home Care Screening  Complete   Medication Review (RN CM)  Complete   HRI or Home Care Consult   Complete  Social Work Consult for Recovery Care Planning/Counseling   --  Palliative Care Screening   Not Applicable  Medication Review Oceanographer) Complete  Complete  PCP or Specialist appointment within 3-5 days of discharge  Complete    HRI or Home Care Consult Complete    SW Recovery Care/Counseling Consult Complete    Palliative Care Screening Not Applicable    Skilled Nursing Facility Not Applicable

## 2023-07-30 DIAGNOSIS — I2510 Atherosclerotic heart disease of native coronary artery without angina pectoris: Secondary | ICD-10-CM | POA: Diagnosis not present

## 2023-07-30 DIAGNOSIS — I70209 Unspecified atherosclerosis of native arteries of extremities, unspecified extremity: Secondary | ICD-10-CM | POA: Diagnosis not present

## 2023-07-30 DIAGNOSIS — Z8673 Personal history of transient ischemic attack (TIA), and cerebral infarction without residual deficits: Secondary | ICD-10-CM | POA: Diagnosis not present

## 2023-07-30 DIAGNOSIS — Z741 Need for assistance with personal care: Secondary | ICD-10-CM | POA: Diagnosis not present

## 2023-07-30 DIAGNOSIS — J309 Allergic rhinitis, unspecified: Secondary | ICD-10-CM | POA: Diagnosis not present

## 2023-07-30 DIAGNOSIS — E785 Hyperlipidemia, unspecified: Secondary | ICD-10-CM | POA: Diagnosis not present

## 2023-07-30 DIAGNOSIS — I5032 Chronic diastolic (congestive) heart failure: Secondary | ICD-10-CM | POA: Diagnosis not present

## 2023-07-30 DIAGNOSIS — E1142 Type 2 diabetes mellitus with diabetic polyneuropathy: Secondary | ICD-10-CM | POA: Diagnosis not present

## 2023-07-30 DIAGNOSIS — F17210 Nicotine dependence, cigarettes, uncomplicated: Secondary | ICD-10-CM | POA: Diagnosis not present

## 2023-07-30 DIAGNOSIS — J45909 Unspecified asthma, uncomplicated: Secondary | ICD-10-CM | POA: Diagnosis not present

## 2023-07-30 DIAGNOSIS — W19XXXA Unspecified fall, initial encounter: Secondary | ICD-10-CM | POA: Diagnosis not present

## 2023-07-30 DIAGNOSIS — G4733 Obstructive sleep apnea (adult) (pediatric): Secondary | ICD-10-CM | POA: Diagnosis not present

## 2023-07-30 DIAGNOSIS — R21 Rash and other nonspecific skin eruption: Secondary | ICD-10-CM | POA: Diagnosis not present

## 2023-07-30 DIAGNOSIS — Z7401 Bed confinement status: Secondary | ICD-10-CM | POA: Diagnosis not present

## 2023-07-30 DIAGNOSIS — H04129 Dry eye syndrome of unspecified lacrimal gland: Secondary | ICD-10-CM | POA: Diagnosis not present

## 2023-07-30 DIAGNOSIS — K219 Gastro-esophageal reflux disease without esophagitis: Secondary | ICD-10-CM | POA: Diagnosis not present

## 2023-07-30 DIAGNOSIS — I69354 Hemiplegia and hemiparesis following cerebral infarction affecting left non-dominant side: Secondary | ICD-10-CM | POA: Diagnosis not present

## 2023-07-30 DIAGNOSIS — J449 Chronic obstructive pulmonary disease, unspecified: Secondary | ICD-10-CM | POA: Diagnosis not present

## 2023-07-30 DIAGNOSIS — G894 Chronic pain syndrome: Secondary | ICD-10-CM | POA: Diagnosis not present

## 2023-07-30 DIAGNOSIS — E162 Hypoglycemia, unspecified: Secondary | ICD-10-CM | POA: Diagnosis not present

## 2023-07-30 DIAGNOSIS — E1165 Type 2 diabetes mellitus with hyperglycemia: Secondary | ICD-10-CM | POA: Diagnosis not present

## 2023-07-30 DIAGNOSIS — Z794 Long term (current) use of insulin: Secondary | ICD-10-CM | POA: Diagnosis not present

## 2023-07-30 DIAGNOSIS — I110 Hypertensive heart disease with heart failure: Secondary | ICD-10-CM | POA: Diagnosis not present

## 2023-07-30 DIAGNOSIS — M6259 Muscle wasting and atrophy, not elsewhere classified, multiple sites: Secondary | ICD-10-CM | POA: Diagnosis not present

## 2023-07-30 DIAGNOSIS — Z743 Need for continuous supervision: Secondary | ICD-10-CM | POA: Diagnosis not present

## 2023-07-30 DIAGNOSIS — R2681 Unsteadiness on feet: Secondary | ICD-10-CM | POA: Diagnosis not present

## 2023-07-30 DIAGNOSIS — R55 Syncope and collapse: Secondary | ICD-10-CM | POA: Diagnosis not present

## 2023-07-30 DIAGNOSIS — S92352D Displaced fracture of fifth metatarsal bone, left foot, subsequent encounter for fracture with routine healing: Secondary | ICD-10-CM | POA: Diagnosis not present

## 2023-07-30 DIAGNOSIS — E119 Type 2 diabetes mellitus without complications: Secondary | ICD-10-CM | POA: Diagnosis not present

## 2023-07-30 DIAGNOSIS — G459 Transient cerebral ischemic attack, unspecified: Secondary | ICD-10-CM | POA: Diagnosis not present

## 2023-07-30 DIAGNOSIS — R569 Unspecified convulsions: Secondary | ICD-10-CM | POA: Diagnosis not present

## 2023-07-30 DIAGNOSIS — G4700 Insomnia, unspecified: Secondary | ICD-10-CM | POA: Diagnosis not present

## 2023-07-30 DIAGNOSIS — R54 Age-related physical debility: Secondary | ICD-10-CM | POA: Diagnosis not present

## 2023-07-30 DIAGNOSIS — S92352A Displaced fracture of fifth metatarsal bone, left foot, initial encounter for closed fracture: Secondary | ICD-10-CM | POA: Diagnosis not present

## 2023-07-30 DIAGNOSIS — E1140 Type 2 diabetes mellitus with diabetic neuropathy, unspecified: Secondary | ICD-10-CM | POA: Diagnosis not present

## 2023-07-30 DIAGNOSIS — H5440 Blindness, one eye, unspecified eye: Secondary | ICD-10-CM | POA: Diagnosis not present

## 2023-07-30 DIAGNOSIS — I10 Essential (primary) hypertension: Secondary | ICD-10-CM | POA: Diagnosis not present

## 2023-07-30 DIAGNOSIS — N3281 Overactive bladder: Secondary | ICD-10-CM | POA: Diagnosis not present

## 2023-07-30 DIAGNOSIS — Y92009 Unspecified place in unspecified non-institutional (private) residence as the place of occurrence of the external cause: Secondary | ICD-10-CM | POA: Diagnosis not present

## 2023-07-30 LAB — GLUCOSE, CAPILLARY
Glucose-Capillary: 303 mg/dL — ABNORMAL HIGH (ref 70–99)
Glucose-Capillary: 257 mg/dL — ABNORMAL HIGH (ref 70–99)

## 2023-07-30 MED ORDER — BUPRENORPHINE 7.5 MCG/HR TD PTWK: 1.0000 | MEDICATED_PATCH | TRANSDERMAL | 0 refills | Status: DC

## 2023-07-30 MED ORDER — OXYCODONE HCL 5 MG PO TABS: 5.0000 mg | ORAL_TABLET | Freq: Four times a day (QID) | ORAL | 0 refills | Status: DC | PRN

## 2023-07-30 NOTE — Plan of Care (Signed)
Patient discharged. Goals met

## 2023-07-30 NOTE — Telephone Encounter (Signed)
 Requested Prescriptions  Pending Prescriptions Disp Refills   pantoprazole  (PROTONIX ) 40 MG tablet [Pharmacy Med Name: Pantoprazole  Sodium 40 MG Oral Tablet Delayed Release] 100 tablet 2    Sig: TAKE 1 TABLET BY MOUTH DAILY AS  NEEDED     Gastroenterology: Proton Pump Inhibitors Passed - 07/30/2023  2:02 PM      Passed - Valid encounter within last 12 months    Recent Outpatient Visits           1 month ago Uncontrolled hypertension   [facility] Glenard Mire, MD   4 months ago AKI (acute kidney injury) [facility])   [facility] [name], [name]   10 years ago HYPERTENSION   [facility] [place]. [facility] Rai, Ripudeep K, MD   10 years ago Depressive disorder, not elsewhere classified   [facility] [place]. [facility] Ghimire, Donalda HERO, MD       Future Appointments             In 1 month [name], [name] [facility], [facility]

## 2023-07-30 NOTE — TOC Transition Note (Signed)
 Transition of Care [facility]) - Discharge Note   Patient Details  Name: [name] MRN: [id number] Date of Birth: [birth year]  [facility]) CM/SW Contact:  [place] L Alistar Mcenery, LCSW Phone Number: 07/30/2023, 8:37 AM   Clinical Narrative:     Patient discharging to [facility] today. Patient advised at bedside by RN. Transportation scheduled with [facility]. [facility] notified and ready to receive patient. No further TOC needs identified.   TOC signing off.   Final next level of care: Skilled Nursing Facility Barriers to Discharge: No Barriers Identified   Patient Goals and CMS Choice     Choice offered to / list presented to : Patient [place] ownership interest in [facility]provided to:: Patient    Discharge Placement                  Name of family member notified: Patient notified at bedside Patient and family notified of of transfer: 07/30/23  Discharge Plan and Services Additional resources added to the After Visit Summary for   In-house Referral: Clinical Social Work                                   Social Drivers of Health (SDOH) Interventions SDOH Screenings   Food Insecurity: No Food Insecurity (07/26/2023)  Housing: Low Risk  (07/26/2023)  Transportation Needs: No Transportation Needs (07/26/2023)  Utilities: Not At Risk (07/26/2023)  Alcohol  Screen: Low Risk  (04/01/2023)  Depression (PHQ2-9): Low Risk  (06/15/2023)  Financial Resource Strain: Low Risk  (04/01/2023)  Physical Activity: Insufficiently Active (04/01/2023)  Social Connections: Moderately Isolated (04/01/2023)  Stress: No Stress Concern Present (04/01/2023)  Tobacco Use: Medium Risk (07/25/2023)  Health Literacy: Adequate Health Literacy (04/01/2023)     Readmission Risk Interventions    06/11/2021   11:43 AM 05/19/2021   12:02 PM 04/24/2021    3:02 PM  Readmission Risk Prevention Plan  Transportation Screening Complete Complete Complete  PCP or Specialist  Appt within 5-7 Days  Complete   Home Care Screening  Complete   Medication Review (RN CM)  Complete   HRI or Home Care Consult   Complete  Social Work Consult for Recovery Care Planning/Counseling   --  Palliative Care Screening   Not Applicable  Medication Review Oceanographer) Complete  Complete  PCP or Specialist appointment within 3-5 days of discharge Complete    HRI or Home Care Consult Complete    SW Recovery Care/Counseling Consult Complete    Palliative Care Screening Not Applicable    Skilled Nursing Facility Not Applicable

## 2023-07-30 NOTE — Progress Notes (Signed)
 Patient discharged to [facility] for PT. Writer called facility [phone] and gave report to nurse Ozell. Patient has all belongings with her including home CPAP machine. Patient has no further questions at this time. Patient to be transported by ambulance via stretcher when ride arrives.

## 2023-07-30 NOTE — Plan of Care (Signed)

## 2023-07-30 NOTE — Discharge Summary (Addendum)
 Physician Discharge Summary   Patient: [name] MRN: [id number] DOB: 11-Feb-1965  Admit date:     07/25/2023  Discharge date: 07/30/23  Discharge Physician: Murvin Mana   PCP: [name], [name]   Recommendations at discharge:    Follow up with pcp in 1 week Podiatry for 2 weeks  Discharge Diagnoses: Principal Problem:   Near syncope Active Problems:   Syncope   Fall at home, initial encounter   Essential hypertension, benign   Chronic diastolic CHF (congestive heart failure) (HCC)   CAD (coronary artery disease)   GERD without esophagitis   Hemiparesis affecting left side as late effect of stroke (HCC)   Uncontrolled type 2 diabetes mellitus with hyperglycemia (HCC)   Dyslipidemia associated with type 2 diabetes mellitus (HCC)   OSA (obstructive sleep apnea)   Chronic pain   Class 2 severe obesity with serious comorbidity and body mass index (BMI) of 39.0 to 39.9 in adult [facility])   Epilepsy idiopathic petit mal (HCC)   PAD (peripheral artery disease) (HCC)   Closed displaced fracture of fifth metatarsal bone of left foot  Resolved Problems:   * No resolved hospital problems. *  Hospital Course: [name]  is a 58 y.o. female,  with history of previous abdominal surgery, previous SBO, obesity, coronary artery disease, GERD, heart failure preserved ejection fraction, uncontrolled type II diabetes mellitus, insulin -dependent type II diabetes mellitus presented to ED after having a syncopal episode resulted in fall causing pain in bilateral feet and ankle, unable to bear weight on left ankle.  Patient felt lightheadedness while walking towards her car and then passed out. Workup showed ejection fraction 60 to 65%, grade 1 diastolic dysfunction, no valvular disease.  Telemetry did not show any arrhythmia. Patient also sustained a fracture in the left fifth metatarsal. Patient also seen by podiatry, no need for surgery.  Seen by PT, recommended nursing home placement.   Medically stable for transfer today.   Assessment and Plan: Near syncope Syncope ruled out. Patient remember falling, never lost consciousness.  She states that that she was walking to her car in a very hot weather, sweaty.  Then she fell lightheaded and weak, could not sustain herself and fell on the ground. CT head with no acute abnormality and EKG with no acute changes. Patient has an history of CVA with some residual left-sided weakness. Echocardiogram with normal EF and grade 1 diastolic dysfunction.  No other significant abnormality. Patient is working with physical therapy, no dizziness.  Condition resolved.   Fall at home, initial encounter Acute mildly displaced fracture at the base of the fifth metatarsal.  All other imaging negative for any more bony abnormality.  PT and OT are recommending SNF -Podiatry was consulted and they were recommending cam boot and outpatient follow-up Continue pain medicine and PT/OT.   Essential hypertension, benign Blood pressure stable on home medicines   Chronic diastolic CHF (congestive heart failure) (HCC) Repeat echocardiogram with grade 1 diastolic dysfunction and normal EF. Stable without volume overload.   CAD (coronary artery disease) No chest pain.   - Continue home Plavix  and Crestor    GERD without esophagitis - Continue with PPI   Hemiparesis affecting left side as late effect of stroke [facility]) Patient has residual left-sided weakness from prior stroke.  No acute concern. - Continue home Plavix  and statin   Uncontrolled type 2 diabetes mellitus with hyperglycemia (HCC) Resumed home dose which he appeared to be more appropriate   Dyslipidemia associated with type 2  diabetes mellitus (HCC) - Continue with Zetia  and Crestor    OSA (obstructive sleep apnea) - CPAP at night   Chronic pain Patient is on multiple pain medications which are being managed by outpatient pain specialist. - Continue home meds   Class 2 severe  obesity with serious comorbidity and body mass index (BMI) of 39.0 to 39.9 in adult [facility]) Estimated body mass index is 37.93 kg/m as calculated from the following:   Height as of this encounter: 5' 6 (1.676 m).   Weight as of this encounter: 106.6 kg.  Diet and exercise advised   Epilepsy idiopathic petit mal (HCC) No seizure before admission.  Stable, continue home medicines         Consultants: Podiatry Procedures performed: None  Disposition: Skilled nursing facility Diet recommendation:  Discharge Diet Orders (From admission, onward)     Start     Ordered   07/30/23 0000  Diet - low sodium heart healthy        07/30/23 0741           Carb modified diet DISCHARGE MEDICATION: Allergies as of 07/30/2023       Reactions   Codeine Swelling, Other (See Comments)   Swelling and burning of mouth (inside)   Farxiga  [dapagliflozin ] Other (See Comments)   Recurrent UTI with farxiga    Lactose Intolerance (gi) Nausea And Vomiting        Medication List     TAKE these medications    Accu-Chek Guide Test test strip Generic drug: glucose blood USE 1 STRIP IN THE MORNING, AT  NOON, AND AT BEDTIME   albuterol  1.25 MG/3ML nebulizer solution Commonly known as: ACCUNEB  USE 1 VIAL VIA NEBULIZER TWICE  DAILY   albuterol  108 (90 Base) MCG/ACT inhaler Commonly known as: VENTOLIN  HFA USE 1 TO 2 INHALATIONS BY MOUTH  EVERY 6 HOURS AS NEEDED   amitriptyline  25 MG tablet Commonly known as: ELAVIL  Take 2-3 tablets (50-75 mg total) by mouth at bedtime.   amLODipine  10 MG tablet Commonly known as: NORVASC  TAKE 1 TABLET BY MOUTH DAILY   buprenorphine  7.5 MCG/HR Commonly known as: Butrans  Place 1 patch onto the skin once a week.   butalbital -acetaminophen -caffeine  50-325-40 MG tablet Commonly known as: FIORICET  Take 1-2 tablets by mouth every 6 (six) hours as needed for headache.   carvedilol  25 MG tablet Commonly known as: COREG  Take 25 mg by mouth 2 (two) times  daily.   cholecalciferol  25 MCG (1000 UNIT) tablet Commonly known as: VITAMIN D3 Take 1 tablet (1,000 Units total) by mouth daily.   clopidogrel  75 MG tablet Commonly known as: PLAVIX  Take 1 tablet (75 mg total) by mouth daily.   cycloSPORINE  0.05 % ophthalmic emulsion Commonly known as: RESTASIS  Place 2 drops into both eyes 2 (two) times daily.   DULoxetine  60 MG capsule Commonly known as: CYMBALTA  TAKE 1 CAPSULE BY MOUTH EVERY DAY   EPINEPHrine  0.3 mg/0.3 mL Soaj injection Commonly known as: EPI-PEN INJECT 1 PEN INTRAMUSCULARLY AS  NEEDED FOR ALLERGIC RESPONSE AS  DIRECTED BY MD. SEEK MEDICAL  HELP AFTER USE   ezetimibe  10 MG tablet Commonly known as: ZETIA  TAKE 1 TABLET BY MOUTH DAILY   FreeStyle Libre 3 Sensor Misc 1 Device by Does not apply route continuous.   FreeStyle Libre 3 Plus Sensor Misc Inject 1 Device into the skin continuous. Change every 14 days   furosemide  20 MG tablet Commonly known as: LASIX  TAKE 1 TABLET BY MOUTH DAILY   gabapentin  600 MG  tablet Commonly known as: Neurontin  600 mg qAM, 600 mg qPM, 1200 mg QHS   Gvoke HypoPen  2-Pack 1 MG/0.2ML Soaj Generic drug: Glucagon  INJECT SUB-Q 1MG  ONCE AS NEEDED  FOR UP TO 1 DOSE. ONLY FOR  SEVERE LOW BLOOD SUGAR NOT  RESPONDING TO DRINKING  JUICE/GLUCOSE TABS, WHEN CBG &lt;70   hydrALAZINE  50 MG tablet Commonly known as: APRESOLINE  Take 1 tablet by mouth 3 (three) times daily.   insulin  lispro 100 UNIT/ML KwikPen Commonly known as: HumaLOG  KwikPen Inject 15 Units into the skin 3 (three) times daily. Plus Sliding scale, max dose 80 units a day   levETIRAcetam  500 MG tablet Commonly known as: KEPPRA  Take 500-750 mg by mouth See admin instructions. Take 1 tablet (500mg ) by mouth every morning and take 1 tablets by mouth (750mg ) by mouth every night   meclizine  25 MG tablet Commonly known as: ANTIVERT  Take 1 tablet (25 mg total) by mouth 3 (three) times daily as needed for dizziness.   Myrbetriq  50  MG Tb24 tablet Generic drug: mirabegron  ER TAKE 1 TABLET BY MOUTH DAILY   nitroGLYCERIN  0.4 MG SL tablet Commonly known as: NITROSTAT  Place 0.4 mg under the tongue every 5 (five) minutes as needed for chest pain.   oxyCODONE  5 MG immediate release tablet Commonly known as: Oxy IR/ROXICODONE  Take 1 tablet (5 mg total) by mouth every 6 (six) hours as needed for severe pain (pain score 7-10).   pantoprazole  40 MG tablet Commonly known as: PROTONIX  Take 1 tablet (40 mg total) by mouth daily as needed.   Pen Needles 32G X 4 MM Misc 1 Device by Does not apply route in the morning, at noon, in the evening, and at bedtime.   polyethylene glycol 17 g packet Commonly known as: MIRALAX  / GLYCOLAX  Take 17 g by mouth daily as needed for mild constipation.   rosuvastatin  40 MG tablet Commonly known as: CRESTOR  TAKE 1 TABLET BY MOUTH DAILY   senna-docusate 8.6-50 MG tablet Commonly known as: Senokot-S Take 1 tablet by mouth at bedtime as needed for mild constipation.   tiZANidine  4 MG tablet Commonly known as: ZANAFLEX  Take 1 tablet (4 mg total) by mouth every 8 (eight) hours as needed for muscle spasms.   Toujeo  Max SoloStar 300 UNIT/ML Solostar Pen Generic drug: insulin  glargine (2 Unit Dial ) Inject 50 Units into the skin at bedtime. Plus sliding scale, max dose 80 units a day   Trulicity  4.5 MG/0.5ML Soaj Generic drug: Dulaglutide  Inject 4.5 mg as directed once a week.   Ubrelvy  50 MG Tabs Generic drug: Ubrogepant  50 mg or 100 mg taken orally with or without food. If needed, a second dose may be taken at least 2 hours after the initial dose. The maximum dose in a 24-hour period is 200 mg   Wixela Inhub 250-50 MCG/DOSE Aepb Generic drug: fluticasone -salmeterol Inhale 1 puff into the lungs 2 (two) times daily.        Contact information for follow-up providers     [name], [name] Follow up in 1 week(s).   Specialty: Family Medicine Contact information: [address] [place] [address] [phone]         Ashley Soulier, DPM Follow up in 2 week(s).   Specialty: Podiatry Contact information: [address] [place] [address] [phone]              Contact information for after-discharge care     Destination     [facility]  Service: Skilled Nursing Contact information: [address] [place]  [address] [phone]                    Discharge Exam: Fredricka Weights   07/25/23 1846  Weight: 106.6 kg   General exam: Appears calm and comfortable, obese Respiratory system: Clear to auscultation. Respiratory effort normal. Cardiovascular system: S1 & S2 heard, RRR. No JVD, murmurs, rubs, gallops or clicks. No pedal edema. Gastrointestinal system: Abdomen is nondistended, soft and nontender. No organomegaly or masses felt. Normal bowel sounds heard. Central nervous system: Alert and oriented. No focal neurological deficits. Extremities: Symmetric 5 x 5 power. Skin: No rashes, lesions or ulcers Psychiatry: Judgement and insight appear normal. Mood & affect appropriate.    Condition at discharge: good  The results of significant diagnostics from this hospitalization (including imaging, microbiology, ancillary and laboratory) are listed below for reference.   Imaging Studies: ECHOCARDIOGRAM COMPLETE Result Date: 07/26/2023    ECHOCARDIOGRAM REPORT   Patient Name:   NEITA LANDRIGAN Date of Exam: 07/26/2023 Medical Rec #:  [id number]             Height:       66.0 in Accession #:    [id number]            Weight:       235.0 lb Date of Birth:  1965-02-06              BSA:          2.142 m Patient Age:    57 years              BP:           122/76 mmHg Patient Gender: F                     HR:           70 bpm. Exam Location:  [facility] Procedure: 2D Echo, Cardiac Doppler, Color Doppler and Strain Analysis (Both            Spectral and Color Flow Doppler were utilized during  procedure). Indications:     Syncope R55  History:         Patient has prior history of Echocardiogram examinations, most                  recent 12/11/2015.  Sonographer:     Ashley McNeely-Sloane Referring Phys:  [id number] SUMAYYA AMIN Diagnosing Phys: Dwayne D Callwood MD  Sonographer Comments: Global longitudinal strain was attempted. IMPRESSIONS  1. Left ventricular ejection fraction, by estimation, is 55 to 60%. The left ventricle has normal function. The left ventricle has no regional wall motion abnormalities. There is moderate concentric left ventricular hypertrophy. Left ventricular diastolic parameters are consistent with Grade I diastolic dysfunction (impaired relaxation).  2. Right ventricular systolic function is normal. The right ventricular size is normal.  3. The mitral valve is normal in structure. Mild mitral valve regurgitation.  4. The aortic valve is normal in structure. Aortic valve regurgitation is trivial. FINDINGS  Left Ventricle: Left ventricular ejection fraction, by estimation, is 55 to 60%. The left ventricle has normal function. The left ventricle has no regional wall motion abnormalities. Strain was performed and the global longitudinal strain is indeterminate. The left ventricular internal cavity size was normal in size. There is moderate concentric left ventricular hypertrophy. Left ventricular diastolic parameters are consistent with Grade I diastolic dysfunction (impaired relaxation). Right Ventricle:  The right ventricular size is normal. No increase in right ventricular wall thickness. Right ventricular systolic function is normal. Left Atrium: Left atrial size was normal in size. Right Atrium: Right atrial size was normal in size. Pericardium: There is no evidence of pericardial effusion. Mitral Valve: The mitral valve is normal in structure. Mild mitral valve regurgitation. MV peak gradient, 3.5 mmHg. The mean mitral valve gradient is 2.0 mmHg. Tricuspid Valve: The tricuspid  valve is normal in structure. Tricuspid valve regurgitation is mild. Aortic Valve: The aortic valve is normal in structure. Aortic valve regurgitation is trivial. Aortic valve mean gradient measures 3.0 mmHg. Aortic valve peak gradient measures 4.5 mmHg. Aortic valve area, by VTI measures 3.97 cm. Pulmonic Valve: The pulmonic valve was normal in structure. Pulmonic valve regurgitation is not visualized. Aorta: The ascending aorta was not well visualized. IAS/Shunts: No atrial level shunt detected by color flow Doppler. Additional Comments: 3D was performed not requiring image post processing on an independent workstation and was indeterminate.  LEFT VENTRICLE PLAX 2D LVIDd:         4.35 cm     Diastology LVIDs:         2.04 cm     LV e' medial:    4.84 cm/s LV PW:         1.77 cm     LV E/e' medial:  16.8 LV IVS:        1.39 cm     LV e' lateral:   5.22 cm/s LVOT diam:     2.20 cm     LV E/e' lateral: 15.6 LV SV:         79 LV SV Index:   37 LVOT Area:     3.80 cm  LV Volumes (MOD) LV vol d, MOD A2C: 65.0 ml LV vol d, MOD A4C: 70.5 ml LV vol s, MOD A2C: 29.1 ml LV vol s, MOD A4C: 51.0 ml LV SV MOD A2C:     35.9 ml LV SV MOD A4C:     70.5 ml LV SV MOD BP:      29.4 ml RIGHT VENTRICLE          IVC RV Basal diam:  2.39 cm  IVC diam: 1.63 cm RV Mid diam:    2.38 cm TAPSE (M-mode): 1.5 cm LEFT ATRIUM             Index        RIGHT ATRIUM           Index LA diam:        2.08 cm 0.97 cm/m   RA Area:     14.00 cm LA Vol (A2C):   41.2 ml 19.24 ml/m  RA Volume:   33.10 ml  15.46 ml/m LA Vol (A4C):   33.1 ml 15.46 ml/m LA Biplane Vol: 36.9 ml 17.23 ml/m  AORTIC VALVE                    PULMONIC VALVE AV Area (Vmax):    3.94 cm     PV Vmax:        0.84 m/s AV Area (Vmean):   3.96 cm     PV Vmean:       58.100 cm/s AV Area (VTI):     3.97 cm     PV VTI:         0.215 m AV Vmax:           106.00 cm/s  PV  Peak grad:   2.9 mmHg AV Vmean:          78.200 cm/s  PV Mean grad:   1.0 mmHg AV VTI:            0.200 m      RVOT  Peak grad: 1 mmHg AV Peak Grad:      4.5 mmHg AV Mean Grad:      3.0 mmHg LVOT Vmax:         110.00 cm/s LVOT Vmean:        81.500 cm/s LVOT VTI:          0.209 m LVOT/AV VTI ratio: 1.04  AORTA Ao Root diam: 2.05 cm Ao Asc diam:  3.50 cm MITRAL VALVE MV Area (PHT): 3.56 cm    SHUNTS MV Area VTI:   2.95 cm    Systemic VTI:  0.21 m MV Peak grad:  3.5 mmHg    Systemic Diam: 2.20 cm MV Mean grad:  2.0 mmHg    Pulmonic VTI:  0.151 m MV Vmax:       0.93 m/s MV Vmean:      61.5 cm/s MV Decel Time: 213 msec MV E velocity: 81.50 cm/s Cara JONETTA Lovelace MD Electronically signed by Cara JONETTA Lovelace MD Signature Date/Time: 07/26/2023/5:24:19 PM    Final    DG Tibia/Fibula Left Result Date: 07/26/2023 CLINICAL DATA:  Fall, syncope. EXAM: LEFT TIBIA AND FIBULA - 2 VIEW COMPARISON:  None Available. FINDINGS: No fracture of the tibia or fibula. Knee joint and ankle joint appear normal on two views. IMPRESSION: No fracture or dislocation. Electronically Signed   By: Jackquline Boxer M.D.   On: 07/26/2023 12:06   CT HEAD WO CONTRAST ( ) Result Date: 07/25/2023 CLINICAL DATA:  Syncope EXAM: CT HEAD WITHOUT CONTRAST TECHNIQUE: Contiguous axial images were obtained from the base of the skull through the vertex without intravenous contrast. RADIATION DOSE REDUCTION: This exam was performed according to the departmental dose-optimization program which includes automated exposure control, adjustment of the mA and/or kV according to patient size and/or use of iterative reconstruction technique. COMPARISON:  06/16/2023 FINDINGS: Brain: Mild cerebral atrophy. No acute intracranial abnormality. Specifically, no hemorrhage, hydrocephalus, mass lesion, acute infarction, or significant intracranial injury. Densely calcified falx, unchanged. Vascular: No hyperdense vessel or unexpected calcification. Skull: No acute calvarial abnormality. Sinuses/Orbits: No acute findings Other: None IMPRESSION: Cerebral atrophy. No acute intracranial  abnormality. Electronically Signed   By: Franky Crease M.D.   On: 07/25/2023 23:29   DG Foot Complete Left Result Date: 07/25/2023 CLINICAL DATA:  Mid foot pain after fall EXAM: LEFT FOOT - COMPLETE 3+ VIEW COMPARISON:  None Available. FINDINGS: Acute mildly displaced fracture at the base of the fifth metatarsal. Tiny plantar calcaneal spur. IMPRESSION: Acute mildly displaced fracture at the base of the fifth metatarsal. Electronically Signed   By: Luke Bun M.D.   On: 07/25/2023 21:02   DG Foot Complete Right Result Date: 07/25/2023 CLINICAL DATA:  Mid foot pain after fall EXAM: RIGHT FOOT COMPLETE - 3+ VIEW COMPARISON:  None Available. FINDINGS: There is no evidence of fracture or dislocation. There is no evidence of arthropathy or other focal bone abnormality. Soft tissues are unremarkable. IMPRESSION: Negative. Electronically Signed   By: Luke Bun M.D.   On: 07/25/2023 21:01   DG Ankle Complete Left Result Date: 07/25/2023 CLINICAL DATA:  Fall, pain EXAM: LEFT ANKLE COMPLETE - 3+ VIEW COMPARISON:  None Available. FINDINGS: No acute bony abnormality. Specifically, no fracture,  subluxation, or dislocation. Joint spaces maintained. Mild diffuse soft tissue swelling. IMPRESSION: No acute bony abnormality. Electronically Signed   By: Franky Crease M.D.   On: 07/25/2023 19:41   DG Ankle Complete Right Result Date: 07/25/2023 CLINICAL DATA:  Fall, pain EXAM: RIGHT ANKLE - COMPLETE 3+ VIEW COMPARISON:  None Available. FINDINGS: No acute bony abnormality. Specifically, no fracture, subluxation, or dislocation. Mild lateral soft tissue swelling. IMPRESSION: No acute bony abnormality. Electronically Signed   By: Franky Crease M.D.   On: 07/25/2023 19:40   DG Chest 2 View Result Date: 07/25/2023 CLINICAL DATA:  Near syncope EXAM: CHEST - 2 VIEW COMPARISON:  06/12/2023 FINDINGS: Heart and mediastinal contours are within normal limits. No focal opacities or effusions. No acute bony abnormality. IMPRESSION: No  active cardiopulmonary disease. Electronically Signed   By: Franky Crease M.D.   On: 07/25/2023 19:39    Microbiology: Results for orders placed or performed during the hospital encounter of 06/12/23  Resp panel by RT-PCR (RSV, Flu A&B, Covid) Anterior Nasal Swab     Status: None   Collection Time: 06/12/23 10:24 AM   Specimen: Anterior Nasal Swab  Result Value Ref Range Status   SARS Coronavirus 2 by RT PCR NEGATIVE NEGATIVE Final    Comment: (NOTE) SARS-CoV-2 target nucleic acids are NOT DETECTED.  The SARS-CoV-2 RNA is generally detectable in upper respiratory specimens during the acute phase of infection. The lowest concentration of SARS-CoV-2 viral copies this assay can detect is 138 copies/mL. A negative result does not preclude SARS-Cov-2 infection and should not be used as the sole basis for treatment or other patient management decisions. A negative result may occur with  improper specimen collection/handling, submission of specimen other than nasopharyngeal swab, presence of viral mutation(s) within the areas targeted by this assay, and inadequate number of viral copies(<138 copies/mL). A negative result must be combined with clinical observations, patient history, and epidemiological information. The expected result is Negative.  Fact Sheet for Patients:  BloggerCourse.com  Fact Sheet for Healthcare Providers:  SeriousBroker.it  This test is no t yet approved or cleared by the [place]  FDA and  has been authorized for detection and/or diagnosis of SARS-CoV-2 by FDA under an Emergency Use Authorization (EUA). This EUA will remain  in effect (meaning this test can be used) for the duration of the COVID-19 declaration under Section 564(b)(1) of the Act, 21 U.S.C.section 360bbb-3(b)(1), unless the authorization is terminated  or revoked sooner.       Influenza A by PCR NEGATIVE NEGATIVE Final   Influenza B by PCR  NEGATIVE NEGATIVE Final    Comment: (NOTE) The Xpert Xpress SARS-CoV-2/FLU/RSV plus assay is intended as an aid in the diagnosis of influenza from Nasopharyngeal swab specimens and should not be used as a sole basis for treatment. Nasal washings and aspirates are unacceptable for Xpert Xpress SARS-CoV-2/FLU/RSV testing.  Fact Sheet for Patients: BloggerCourse.com  Fact Sheet for Healthcare Providers: SeriousBroker.it  This test is not yet approved or cleared by the [place]  FDA and has been authorized for detection and/or diagnosis of SARS-CoV-2 by FDA under an Emergency Use Authorization (EUA). This EUA will remain in effect (meaning this test can be used) for the duration of the COVID-19 declaration under Section 564(b)(1) of the Act, 21 U.S.C. section 360bbb-3(b)(1), unless the authorization is terminated or revoked.     Resp Syncytial Virus by PCR NEGATIVE NEGATIVE Final    Comment: (NOTE) Fact Sheet for Patients: BloggerCourse.com  Fact Sheet for Healthcare  Providers: SeriousBroker.it  This test is not yet approved or cleared by the [place]  FDA and has been authorized for detection and/or diagnosis of SARS-CoV-2 by FDA under an Emergency Use Authorization (EUA). This EUA will remain in effect (meaning this test can be used) for the duration of the COVID-19 declaration under Section 564(b)(1) of the Act, 21 U.S.C. section 360bbb-3(b)(1), unless the authorization is terminated or revoked.  Performed at [facility], [address] [place], [place] [address]    *Note: Due to a large number of results and/or encounters for the requested time period, some results have not been displayed. A complete set of results can be found in Results Review.    Labs: CBC: Recent Labs  Lab 07/25/23 1846 07/26/23 0430  WBC 11.2* 8.7  HGB 13.0 11.9*  HCT 41.4  38.0  MCV 91.2 91.1  PLT 291 269   Basic Metabolic Panel: Recent Labs  Lab 07/25/23 1846 07/26/23 0430 07/26/23 2016  NA 139 137 138  K 4.1 3.7 3.7  CL 104 102 103  CO2 26 25 25   GLUCOSE 313* 180* 242*  BUN 16 16 21*  CREATININE 0.93 0.83 0.80  CALCIUM  8.8* 8.4* 8.6*   Liver Function Tests: Recent Labs  Lab 07/26/23 2016  AST 18  ALT 13  ALKPHOS 78  BILITOT 0.4  PROT 7.1  ALBUMIN  3.2*   CBG: Recent Labs  Lab 07/28/23 2137 07/29/23 0804 07/29/23 1223 07/29/23 1718 07/29/23 2146  GLUCAP 240* 261* 217* 248* 269*    Discharge time spent: greater than 30 minutes.  Signed: Murvin Mana, MD [facility] 07/30/2023

## 2023-08-02 DIAGNOSIS — E1165 Type 2 diabetes mellitus with hyperglycemia: Secondary | ICD-10-CM | POA: Diagnosis not present

## 2023-08-03 ENCOUNTER — Encounter: Admitting: Nurse Practitioner

## 2023-08-05 DIAGNOSIS — S92352D Displaced fracture of fifth metatarsal bone, left foot, subsequent encounter for fracture with routine healing: Secondary | ICD-10-CM | POA: Diagnosis not present

## 2023-08-06 DIAGNOSIS — E1165 Type 2 diabetes mellitus with hyperglycemia: Secondary | ICD-10-CM | POA: Diagnosis not present

## 2023-08-06 DIAGNOSIS — S92352D Displaced fracture of fifth metatarsal bone, left foot, subsequent encounter for fracture with routine healing: Secondary | ICD-10-CM | POA: Diagnosis not present

## 2023-08-06 DIAGNOSIS — I10 Essential (primary) hypertension: Secondary | ICD-10-CM | POA: Diagnosis not present

## 2023-08-09 DIAGNOSIS — R195 Other fecal abnormalities: Secondary | ICD-10-CM | POA: Diagnosis not present

## 2023-08-11 DIAGNOSIS — E1165 Type 2 diabetes mellitus with hyperglycemia: Secondary | ICD-10-CM | POA: Diagnosis not present

## 2023-08-11 DIAGNOSIS — E119 Type 2 diabetes mellitus without complications: Secondary | ICD-10-CM | POA: Diagnosis not present

## 2023-08-13 DIAGNOSIS — S92352D Displaced fracture of fifth metatarsal bone, left foot, subsequent encounter for fracture with routine healing: Secondary | ICD-10-CM | POA: Diagnosis not present

## 2023-08-13 DIAGNOSIS — I10 Essential (primary) hypertension: Secondary | ICD-10-CM | POA: Diagnosis not present

## 2023-08-13 DIAGNOSIS — E1165 Type 2 diabetes mellitus with hyperglycemia: Secondary | ICD-10-CM | POA: Diagnosis not present

## 2023-08-13 DIAGNOSIS — I5032 Chronic diastolic (congestive) heart failure: Secondary | ICD-10-CM | POA: Diagnosis not present

## 2023-08-14 ENCOUNTER — Other Ambulatory Visit: Payer: Self-pay | Admitting: "Endocrinology

## 2023-08-14 DIAGNOSIS — E1165 Type 2 diabetes mellitus with hyperglycemia: Secondary | ICD-10-CM

## 2023-08-16 ENCOUNTER — Telehealth: Payer: Self-pay

## 2023-08-16 DIAGNOSIS — G4733 Obstructive sleep apnea (adult) (pediatric): Secondary | ICD-10-CM | POA: Diagnosis not present

## 2023-08-16 DIAGNOSIS — E1122 Type 2 diabetes mellitus with diabetic chronic kidney disease: Secondary | ICD-10-CM | POA: Diagnosis not present

## 2023-08-16 DIAGNOSIS — I5022 Chronic systolic (congestive) heart failure: Secondary | ICD-10-CM | POA: Diagnosis not present

## 2023-08-16 DIAGNOSIS — S92352D Displaced fracture of fifth metatarsal bone, left foot, subsequent encounter for fracture with routine healing: Secondary | ICD-10-CM | POA: Diagnosis not present

## 2023-08-16 DIAGNOSIS — Z9181 History of falling: Secondary | ICD-10-CM | POA: Diagnosis not present

## 2023-08-16 DIAGNOSIS — H547 Unspecified visual loss: Secondary | ICD-10-CM | POA: Diagnosis not present

## 2023-08-16 DIAGNOSIS — N181 Chronic kidney disease, stage 1: Secondary | ICD-10-CM | POA: Diagnosis not present

## 2023-08-16 DIAGNOSIS — E1142 Type 2 diabetes mellitus with diabetic polyneuropathy: Secondary | ICD-10-CM | POA: Diagnosis not present

## 2023-08-16 DIAGNOSIS — I69354 Hemiplegia and hemiparesis following cerebral infarction affecting left non-dominant side: Secondary | ICD-10-CM | POA: Diagnosis not present

## 2023-08-16 DIAGNOSIS — M62838 Other muscle spasm: Secondary | ICD-10-CM | POA: Diagnosis not present

## 2023-08-16 DIAGNOSIS — E785 Hyperlipidemia, unspecified: Secondary | ICD-10-CM | POA: Diagnosis not present

## 2023-08-16 DIAGNOSIS — I130 Hypertensive heart and chronic kidney disease with heart failure and stage 1 through stage 4 chronic kidney disease, or unspecified chronic kidney disease: Secondary | ICD-10-CM | POA: Diagnosis not present

## 2023-08-16 DIAGNOSIS — I2510 Atherosclerotic heart disease of native coronary artery without angina pectoris: Secondary | ICD-10-CM | POA: Diagnosis not present

## 2023-08-16 DIAGNOSIS — N3281 Overactive bladder: Secondary | ICD-10-CM | POA: Diagnosis not present

## 2023-08-16 DIAGNOSIS — K219 Gastro-esophageal reflux disease without esophagitis: Secondary | ICD-10-CM | POA: Diagnosis not present

## 2023-08-16 DIAGNOSIS — J440 Chronic obstructive pulmonary disease with acute lower respiratory infection: Secondary | ICD-10-CM | POA: Diagnosis not present

## 2023-08-16 DIAGNOSIS — G4700 Insomnia, unspecified: Secondary | ICD-10-CM | POA: Diagnosis not present

## 2023-08-16 DIAGNOSIS — E1165 Type 2 diabetes mellitus with hyperglycemia: Secondary | ICD-10-CM | POA: Diagnosis not present

## 2023-08-16 DIAGNOSIS — M778 Other enthesopathies, not elsewhere classified: Secondary | ICD-10-CM | POA: Diagnosis not present

## 2023-08-16 NOTE — Transitions of Care (Post Inpatient/ED Visit) (Signed)
 08/16/2023  Name: [name] MRN: [id number] DOB: 1965-08-04  Today's TOC FU Call Status: Today's TOC FU Call Status:: Successful TOC FU Call Completed TOC FU Call Complete Date: 08/16/23 Patient's Name and Date of Birth confirmed.  Transition Care Management Follow-up Telephone Call Date of Discharge: 08/14/23 Discharge Facility: Other [facility]) Name of Other ([facility]) Discharge Facility: [facility] Type of Discharge: Inpatient Admission Primary Inpatient Discharge Diagnosis:: left foot fracture How have you been since you were released from the hospital?: Better Any questions or concerns?: No  Items Reviewed: Did you receive and understand the discharge instructions provided?: Yes Medications obtained,verified, and reconciled?: Yes (Medications Reviewed) Any new allergies since your discharge?: No Dietary orders reviewed?: Yes Do you have support at home?: Yes People in Home [RPT]: child(ren), adult  Medications Reviewed Today: Medications Reviewed Today     Reviewed by Karen Pan, LPN (Licensed Practical Calderon) on 08/16/23 at 1046  Med List Status: <None>   Medication Order Taking? Sig Documenting Provider Last Dose Status Informant  ACCU-CHEK GUIDE TEST test strip [id number] Yes Calderon 1 STRIP IN THE MORNING, AT  NOON, AND AT BEDTIME Calderon, [name], MD  Active   albuterol  (ACCUNEB ) 1.25 MG/3ML nebulizer solution [id number] Yes Calderon 1 VIAL VIA NEBULIZER TWICE  DAILY Calderon, [name], [name]  Active Self  albuterol  (VENTOLIN  HFA) 108 (90 Base) MCG/ACT inhaler [id number] Yes Calderon 1 TO 2 INHALATIONS BY MOUTH  EVERY 6 HOURS AS NEEDED Calderon, [name], [name]  Active Self  amitriptyline  (ELAVIL ) 25 MG tablet [id number] Yes Take 2-3 tablets (50-75 mg total) by mouth at bedtime. Calderon, Karen K, NP  Active Self  amLODipine  (NORVASC ) 10 MG tablet [id number] Yes TAKE 1 TABLET BY MOUTH DAILY Calderon, [name], [name]  Active Self  buprenorphine  (BUTRANS ) 7.5 MCG/HR [id number] Yes Place  1 patch onto the skin once a week. Karen Pillion, MD  Active   butalbital -acetaminophen -caffeine  (FIORICET ) 50-325-40 MG tablet [id number] Yes Take 1-2 tablets by mouth every 6 (six) hours as needed for headache. Karen Nurse, MD  Active Self           Med Note Calderon, Karen N   Mon Jul 26, 2023 12:58 AM) PRN  carvedilol  (COREG ) 25 MG tablet [id number] Yes Take 25 mg by mouth 2 (two) times daily. [provider]  Active Self  cholecalciferol  (VITAMIN D3) 25 MCG (1000 UNIT) tablet [id number] Yes Take 1 tablet (1,000 Units total) by mouth daily. Calderon, [name], [name]  Active Self  clopidogrel  (PLAVIX ) 75 MG tablet [id number] Yes Take 1 tablet (75 mg total) by mouth daily. [name], MD  Active Self  Continuous Glucose Sensor (FREESTYLE LIBRE 3 PLUS SENSOR) [place] [id number] Yes Inject 1 Device into the skin continuous. Change every 14 days Calderon, [name], MD  Active   Continuous Glucose Sensor (FREESTYLE LIBRE 3 SENSOR) [place] [id number] Yes 1 Device by Does not apply route continuous. Calderon, [name], MD  Active   cycloSPORINE  (RESTASIS ) 0.05 % ophthalmic emulsion [id number] Yes Place 2 drops into both eyes 2 (two) times daily. [provider]  Active Self           Med Note NORVEL KIRSCH   Wed Nov 13, 2020 10:05 AM)    Dulaglutide  (TRULICITY ) 4.5 MG/0.5ML [name] [id number] Yes Inject 4.5 mg as directed once a week. Calderon, [name], MD  Active Self  DULoxetine  (CYMBALTA ) 60 MG capsule [id number] Yes TAKE 1 CAPSULE BY MOUTH EVERY DAY Calderon, Karen F, [name]  Active Self  EPINEPHrine  0.3 mg/0.3 mL  IJ SOAJ injection [id number] Yes INJECT 1 PEN INTRAMUSCULARLY AS  NEEDED FOR ALLERGIC RESPONSE AS  DIRECTED BY MD. Karen MEDICAL  HELP AFTER Calderon Karen Mole, [name]  Active Self           Med Note Calderon, Karen N   Mon Jul 26, 2023  1:00 AM) PRN  ezetimibe  (ZETIA ) 10 MG tablet [id number] Yes TAKE 1 TABLET BY MOUTH DAILY Calderon, [name], [name]  Active Self  furosemide  (LASIX ) 20 MG tablet [id number]  Yes TAKE 1 TABLET BY MOUTH DAILY Calderon, [name], [name]  Active Self  gabapentin  (NEURONTIN ) 600 MG tablet [id number] Yes 600 mg qAM, 600 mg qPM, 1200 mg QHS Calderon, Karen K, NP  Active Self  Glucagon  (GVOKE HYPOPEN  2-PACK) 1 MG/0.2ML [name] [id number] Yes INJECT SUB-Q 1MG  ONCE AS NEEDED  FOR UP TO 1 DOSE. ONLY FOR  SEVERE LOW BLOOD SUGAR NOT  RESPONDING TO DRINKING  JUICE/GLUCOSE TABS, WHEN CBG &lt;70 Calderon, [name], MD  Active Self  hydrALAZINE  (APRESOLINE ) 50 MG tablet [id number] Yes Take 1 tablet by mouth 3 (three) times daily. [provider]  Active Self  insulin  glargine, 2 Unit Dial , (TOUJEO  MAX SOLOSTAR) 300 UNIT/ML Solostar Pen [id number] Yes Inject 50 Units into the skin at bedtime. Plus sliding scale, max dose 80 units a day Calderon, [name], MD  Active Self  insulin  lispro (HUMALOG  KWIKPEN) 100 UNIT/ML KwikPen [id number] Yes Inject 15 Units into the skin 3 (three) times daily. Plus Sliding scale, max dose 80 units a day Calderon, [name], MD  Active Self  Insulin  Pen Needle (PEN NEEDLES) 32G X 4 MM MISC [id number] Yes 1 Device by Does not apply route in the morning, at noon, in the evening, and at bedtime. Calderon, [name], MD  Active Self  levETIRAcetam  (KEPPRA ) 500 MG tablet [id number] Yes Take 500-750 mg by mouth See admin instructions. Take 1 tablet (500mg ) by mouth every morning and take 1 tablets by mouth (750mg ) by mouth every night [provider]  Active Self  meclizine  (ANTIVERT ) 25 MG tablet [id number] Yes Take 1 tablet (25 mg total) by mouth 3 (three) times daily as needed for dizziness. [name], MD  Active Self           Med Note Calderon, Karen N   Mon Jul 26, 2023  1:03 AM) PRN  mirabegron  ER (MYRBETRIQ ) 50 MG TB24 tablet [id number] Yes TAKE 1 TABLET BY MOUTH DAILY Calderon, [name], [name]  Active Self  nitroGLYCERIN  (NITROSTAT ) 0.4 MG SL tablet [id number] Yes Place 0.4 mg under the tongue every 5 (five) minutes as needed for chest pain. [provider]  Active Self            Med Note Calderon, Karen N   Mon Jul 26, 2023  1:03 AM) PRN  oxyCODONE  (OXY IR/ROXICODONE ) 5 MG immediate release tablet [id number] Yes Take 1 tablet (5 mg total) by mouth every 6 (six) hours as needed for severe pain (pain score 7-10). Karen Pillion, MD  Active   pantoprazole  (PROTONIX ) 40 MG tablet [id number] Yes TAKE 1 TABLET BY MOUTH DAILY AS  NEEDED Calderon, [name], [name]  Active   polyethylene glycol (MIRALAX  / GLYCOLAX ) 17 g packet [id number] Yes Take 17 g by mouth daily as needed for mild constipation. Drusilla Sabas RAMAN, MD  Active Self           Med Note Calderon, Karen N   Mon Jul 26, 2023  1:04 AM) PRN  rosuvastatin  (CRESTOR ) 40 MG tablet [id number] Yes TAKE 1 TABLET  BY MOUTH DAILY Calderon, [name], MD  Active Self  senna-docusate (SENOKOT-S) 8.6-50 MG tablet [id number] Yes Take 1 tablet by mouth at bedtime as needed for mild constipation. Drusilla Sabas RAMAN, MD  Active Self           Med Note Calderon, Karen N   Mon Jul 26, 2023  1:05 AM) PRN  tiZANidine  (ZANAFLEX ) 4 MG tablet [id number] Yes Take 1 tablet (4 mg total) by mouth every 8 (eight) hours as needed for muscle spasms. Karen Nurse, MD  Active Self  Ubrogepant  (UBRELVY ) 50 MG TABS [id number] Yes 50 mg or 100 mg taken orally with or without food. If needed, a second dose may be taken at least 2 hours after the initial dose. The maximum dose in a 24-hour period is 200 mg Lateef, Bilal, MD  Active Self  WIXELA INHUB 250-50 MCG/DOSE AEPB [id number] Yes Inhale 1 puff into the lungs 2 (two) times daily. [provider]  Active Self  Med List Note Geanie Wolm HERO, RN 05/13/23 1247): 12-06-18 UDS Oxy/Acet approved through 01-19-20 Mr 08-05-2023 09-14-2022 PA for Buprenorphine  patch done via [facility]  [facility]            Home Care and Equipment/Supplies: Were Home Health Services Ordered?: Yes Name of Home Health Agency:: unknown Has Agency set up a time to come to your home?: Yes First Home Health Visit Date: 08/16/23 Any new equipment  or medical supplies ordered?: NA  Functional Questionnaire: Do you need assistance with bathing/showering or dressing?: Yes Do you need assistance with meal preparation?: Yes Do you need assistance with eating?: No Do you need assistance with getting out of bed/getting out of a chair/moving?: No Do you have difficulty managing or taking your medications?: No  Follow up appointments reviewed: PCP Follow-up appointment confirmed?: Yes Date of PCP follow-up appointment?: 08/27/23 Follow-up Provider: [facility] Follow-up appointment confirmed?: NA Do you need transportation to your follow-up appointment?: No Do you understand care options if your condition(s) worsen?: Yes-patient verbalized understanding    SIGNATURE Julian Lemmings, LPN [facility] Health Advisor Direct Dial  [phone]

## 2023-08-17 NOTE — Telephone Encounter (Signed)
 Requested Prescriptions     Pending Prescriptions Disp Refills    rosuvastatin  (CRESTOR ) 40 MG tablet [Pharmacy Med Name: Rosuvastatin  Calcium  40 MG Oral Tablet] 100 tablet 2     Sig: TAKE 1 TABLET BY MOUTH DAILY

## 2023-08-18 ENCOUNTER — Telehealth: Payer: Self-pay

## 2023-08-18 NOTE — Telephone Encounter (Signed)
 Patient was identified as falling into the [place] Measure - Diabetes.   Patient was: Appointment already scheduled for:  08/27/23.

## 2023-08-20 ENCOUNTER — Other Ambulatory Visit: Payer: Self-pay | Admitting: Family Medicine

## 2023-08-20 ENCOUNTER — Other Ambulatory Visit: Payer: Self-pay

## 2023-08-20 DIAGNOSIS — G4733 Obstructive sleep apnea (adult) (pediatric): Secondary | ICD-10-CM | POA: Diagnosis not present

## 2023-08-20 DIAGNOSIS — I5022 Chronic systolic (congestive) heart failure: Secondary | ICD-10-CM | POA: Diagnosis not present

## 2023-08-20 DIAGNOSIS — I2510 Atherosclerotic heart disease of native coronary artery without angina pectoris: Secondary | ICD-10-CM | POA: Diagnosis not present

## 2023-08-20 DIAGNOSIS — M62838 Other muscle spasm: Secondary | ICD-10-CM | POA: Diagnosis not present

## 2023-08-20 DIAGNOSIS — H547 Unspecified visual loss: Secondary | ICD-10-CM | POA: Diagnosis not present

## 2023-08-20 DIAGNOSIS — I130 Hypertensive heart and chronic kidney disease with heart failure and stage 1 through stage 4 chronic kidney disease, or unspecified chronic kidney disease: Secondary | ICD-10-CM | POA: Diagnosis not present

## 2023-08-20 DIAGNOSIS — M778 Other enthesopathies, not elsewhere classified: Secondary | ICD-10-CM | POA: Diagnosis not present

## 2023-08-20 DIAGNOSIS — G4700 Insomnia, unspecified: Secondary | ICD-10-CM | POA: Diagnosis not present

## 2023-08-20 DIAGNOSIS — K219 Gastro-esophageal reflux disease without esophagitis: Secondary | ICD-10-CM | POA: Diagnosis not present

## 2023-08-20 DIAGNOSIS — E1142 Type 2 diabetes mellitus with diabetic polyneuropathy: Secondary | ICD-10-CM | POA: Diagnosis not present

## 2023-08-20 DIAGNOSIS — S92352D Displaced fracture of fifth metatarsal bone, left foot, subsequent encounter for fracture with routine healing: Secondary | ICD-10-CM | POA: Diagnosis not present

## 2023-08-20 DIAGNOSIS — J440 Chronic obstructive pulmonary disease with acute lower respiratory infection: Secondary | ICD-10-CM | POA: Diagnosis not present

## 2023-08-20 DIAGNOSIS — E785 Hyperlipidemia, unspecified: Secondary | ICD-10-CM | POA: Diagnosis not present

## 2023-08-20 DIAGNOSIS — E1122 Type 2 diabetes mellitus with diabetic chronic kidney disease: Secondary | ICD-10-CM | POA: Diagnosis not present

## 2023-08-20 DIAGNOSIS — I69354 Hemiplegia and hemiparesis following cerebral infarction affecting left non-dominant side: Secondary | ICD-10-CM | POA: Diagnosis not present

## 2023-08-20 DIAGNOSIS — N3281 Overactive bladder: Secondary | ICD-10-CM | POA: Diagnosis not present

## 2023-08-20 DIAGNOSIS — N181 Chronic kidney disease, stage 1: Secondary | ICD-10-CM | POA: Diagnosis not present

## 2023-08-20 DIAGNOSIS — Z9181 History of falling: Secondary | ICD-10-CM | POA: Diagnosis not present

## 2023-08-20 DIAGNOSIS — E1165 Type 2 diabetes mellitus with hyperglycemia: Secondary | ICD-10-CM | POA: Diagnosis not present

## 2023-08-21 DIAGNOSIS — G4733 Obstructive sleep apnea (adult) (pediatric): Secondary | ICD-10-CM | POA: Diagnosis not present

## 2023-08-21 DIAGNOSIS — J449 Chronic obstructive pulmonary disease, unspecified: Secondary | ICD-10-CM | POA: Diagnosis not present

## 2023-08-21 DIAGNOSIS — I10 Essential (primary) hypertension: Secondary | ICD-10-CM | POA: Diagnosis not present

## 2023-08-21 DIAGNOSIS — Z8673 Personal history of transient ischemic attack (TIA), and cerebral infarction without residual deficits: Secondary | ICD-10-CM | POA: Diagnosis not present

## 2023-08-23 NOTE — Telephone Encounter (Signed)
 Requested Prescriptions  Pending Prescriptions Disp Refills   albuterol  (VENTOLIN  HFA) 108 (90 Base) MCG/ACT inhaler [Pharmacy Med Name: ALBUTEROL  HFA 90MCG/ACT (PA)] 17 g 2    Sig: USE 1 TO 2 INHALATIONS BY MOUTH  EVERY 6 HOURS AS NEEDED     Pulmonology:  Beta Agonists 2 Passed - 08/23/2023  1:43 PM      Passed - Last BP in normal range    BP Readings from Last 1 Encounters:  07/30/23 123/70         Passed - Last Heart Rate in normal range    Pulse Readings from Last 1 Encounters:  07/30/23 82         Passed - Valid encounter within last 12 months    Recent Outpatient Visits           2 months ago Uncontrolled hypertension   [place] [facility] Glenard Mire, MD   5 months ago AKI (acute kidney injury) [facility])   [facility] [name], [name]   10 years ago HYPERTENSION   [facility] [place]. [facility] Rai, Ripudeep K, MD   10 years ago Depressive disorder, not elsewhere classified   [facility] [place]. [facility] Ghimire, Donalda HERO, MD       Future Appointments             In 3 weeks [name], [name] [facility], [facility]

## 2023-08-24 DIAGNOSIS — S92302D Fracture of unspecified metatarsal bone(s), left foot, subsequent encounter for fracture with routine healing: Secondary | ICD-10-CM | POA: Diagnosis not present

## 2023-08-24 DIAGNOSIS — M79672 Pain in left foot: Secondary | ICD-10-CM | POA: Diagnosis not present

## 2023-08-24 DIAGNOSIS — Z794 Long term (current) use of insulin: Secondary | ICD-10-CM | POA: Diagnosis not present

## 2023-08-24 DIAGNOSIS — E1165 Type 2 diabetes mellitus with hyperglycemia: Secondary | ICD-10-CM | POA: Diagnosis not present

## 2023-08-26 DIAGNOSIS — N181 Chronic kidney disease, stage 1: Secondary | ICD-10-CM | POA: Diagnosis not present

## 2023-08-26 DIAGNOSIS — I5022 Chronic systolic (congestive) heart failure: Secondary | ICD-10-CM | POA: Diagnosis not present

## 2023-08-26 DIAGNOSIS — N3281 Overactive bladder: Secondary | ICD-10-CM | POA: Diagnosis not present

## 2023-08-26 DIAGNOSIS — E785 Hyperlipidemia, unspecified: Secondary | ICD-10-CM | POA: Diagnosis not present

## 2023-08-26 DIAGNOSIS — I69354 Hemiplegia and hemiparesis following cerebral infarction affecting left non-dominant side: Secondary | ICD-10-CM | POA: Diagnosis not present

## 2023-08-26 DIAGNOSIS — S92352D Displaced fracture of fifth metatarsal bone, left foot, subsequent encounter for fracture with routine healing: Secondary | ICD-10-CM | POA: Diagnosis not present

## 2023-08-26 DIAGNOSIS — M778 Other enthesopathies, not elsewhere classified: Secondary | ICD-10-CM | POA: Diagnosis not present

## 2023-08-26 DIAGNOSIS — I130 Hypertensive heart and chronic kidney disease with heart failure and stage 1 through stage 4 chronic kidney disease, or unspecified chronic kidney disease: Secondary | ICD-10-CM | POA: Diagnosis not present

## 2023-08-26 DIAGNOSIS — K219 Gastro-esophageal reflux disease without esophagitis: Secondary | ICD-10-CM | POA: Diagnosis not present

## 2023-08-26 DIAGNOSIS — E1142 Type 2 diabetes mellitus with diabetic polyneuropathy: Secondary | ICD-10-CM | POA: Diagnosis not present

## 2023-08-26 DIAGNOSIS — Z9181 History of falling: Secondary | ICD-10-CM | POA: Diagnosis not present

## 2023-08-26 DIAGNOSIS — E1165 Type 2 diabetes mellitus with hyperglycemia: Secondary | ICD-10-CM | POA: Diagnosis not present

## 2023-08-26 DIAGNOSIS — I2510 Atherosclerotic heart disease of native coronary artery without angina pectoris: Secondary | ICD-10-CM | POA: Diagnosis not present

## 2023-08-26 DIAGNOSIS — J440 Chronic obstructive pulmonary disease with acute lower respiratory infection: Secondary | ICD-10-CM | POA: Diagnosis not present

## 2023-08-26 DIAGNOSIS — M62838 Other muscle spasm: Secondary | ICD-10-CM | POA: Diagnosis not present

## 2023-08-26 DIAGNOSIS — G4700 Insomnia, unspecified: Secondary | ICD-10-CM | POA: Diagnosis not present

## 2023-08-26 DIAGNOSIS — H547 Unspecified visual loss: Secondary | ICD-10-CM | POA: Diagnosis not present

## 2023-08-26 DIAGNOSIS — E1122 Type 2 diabetes mellitus with diabetic chronic kidney disease: Secondary | ICD-10-CM | POA: Diagnosis not present

## 2023-08-26 DIAGNOSIS — G4733 Obstructive sleep apnea (adult) (pediatric): Secondary | ICD-10-CM | POA: Diagnosis not present

## 2023-08-27 ENCOUNTER — Ambulatory Visit: Admitting: Family Medicine

## 2023-08-27 ENCOUNTER — Encounter: Payer: Self-pay | Admitting: Family Medicine

## 2023-08-27 VITALS — BP 136/74 | HR 90 | Resp 16 | Ht 66.0 in | Wt 243.0 lb

## 2023-08-27 DIAGNOSIS — Z0289 Encounter for other administrative examinations: Secondary | ICD-10-CM

## 2023-08-27 DIAGNOSIS — I10 Essential (primary) hypertension: Secondary | ICD-10-CM

## 2023-08-27 DIAGNOSIS — I5032 Chronic diastolic (congestive) heart failure: Secondary | ICD-10-CM | POA: Diagnosis not present

## 2023-08-27 DIAGNOSIS — E1169 Type 2 diabetes mellitus with other specified complication: Secondary | ICD-10-CM

## 2023-08-27 DIAGNOSIS — E1165 Type 2 diabetes mellitus with hyperglycemia: Secondary | ICD-10-CM

## 2023-08-27 DIAGNOSIS — I739 Peripheral vascular disease, unspecified: Secondary | ICD-10-CM

## 2023-08-27 DIAGNOSIS — N179 Acute kidney failure, unspecified: Secondary | ICD-10-CM | POA: Diagnosis not present

## 2023-08-27 DIAGNOSIS — Z79899 Other long term (current) drug therapy: Secondary | ICD-10-CM

## 2023-08-27 DIAGNOSIS — Z9181 History of falling: Secondary | ICD-10-CM | POA: Diagnosis not present

## 2023-08-27 DIAGNOSIS — G894 Chronic pain syndrome: Secondary | ICD-10-CM

## 2023-08-27 DIAGNOSIS — I69354 Hemiplegia and hemiparesis following cerebral infarction affecting left non-dominant side: Secondary | ICD-10-CM

## 2023-08-27 DIAGNOSIS — S92352S Displaced fracture of fifth metatarsal bone, left foot, sequela: Secondary | ICD-10-CM

## 2023-08-27 DIAGNOSIS — G40A09 Absence epileptic syndrome, not intractable, without status epilepticus: Secondary | ICD-10-CM

## 2023-08-27 DIAGNOSIS — Z7689 Persons encountering health services in other specified circumstances: Secondary | ICD-10-CM

## 2023-08-27 DIAGNOSIS — E66812 Obesity, class 2: Secondary | ICD-10-CM

## 2023-08-27 DIAGNOSIS — Z09 Encounter for follow-up examination after completed treatment for conditions other than malignant neoplasm: Secondary | ICD-10-CM

## 2023-08-27 DIAGNOSIS — S92352D Displaced fracture of fifth metatarsal bone, left foot, subsequent encounter for fracture with routine healing: Secondary | ICD-10-CM | POA: Diagnosis not present

## 2023-08-27 DIAGNOSIS — E785 Hyperlipidemia, unspecified: Secondary | ICD-10-CM | POA: Diagnosis not present

## 2023-08-27 DIAGNOSIS — Z1231 Encounter for screening mammogram for malignant neoplasm of breast: Secondary | ICD-10-CM

## 2023-08-27 DIAGNOSIS — Z794 Long term (current) use of insulin: Secondary | ICD-10-CM

## 2023-08-27 NOTE — Progress Notes (Signed)
 Name: [name]   MRN: [id number]    DOB: 03-19-1965   Date:08/27/2023       Progress Note  Chief Complaint  Patient presents with   Hospitalization Follow-up     Subjective:   [name] is a 58 y.o. female, presents to clinic for routine follow up on chronic conditions  TOC/HFU visit today TOC phonecall done 7/28, d/c from [facility] on 7/26 done by [name] Daughter needs FMLA forms to help with pt's care   I last saw pt Feb 2025 about 6 months ago She did OV with Dr. Glenard 3 months ago BP was uncontrolled  Fall and admission 07/25/2023-07/30/2023 to [facility] then d/c 7/28 no SNF records available, pt does have some papers with her that we can copy and review   HFU/discharge: Admit date:     07/25/2023  Discharge date: 07/30/23  Discharge Physician: [name]    PCP: Karen Mole, [name]    Recommendations at discharge:     Follow up with pcp in 1 week Podiatry for 2 weeks   Discharge Diagnoses: Principal Problem:   Near syncope Active Problems:   Syncope   Fall at home, initial encounter   Essential hypertension, benign   Chronic diastolic CHF (congestive heart failure) (HCC)   CAD (coronary artery disease)   GERD without esophagitis   Hemiparesis affecting left side as late effect of stroke (HCC)   Uncontrolled type 2 diabetes mellitus with hyperglycemia (HCC)   Dyslipidemia associated with type 2 diabetes mellitus (HCC)   OSA (obstructive sleep apnea)   Chronic pain   Class 2 severe obesity with serious comorbidity and body mass index (BMI) of 39.0 to 39.9 in adult [facility])   Epilepsy idiopathic petit mal (HCC)   PAD (peripheral artery disease) (HCC)   Closed displaced fracture of fifth metatarsal bone of left foot   Resolved Problems:   * No resolved hospital problems. *   Hospital Course: [name]  is a 58 y.o. female,  with history of previous abdominal surgery, previous SBO, obesity, coronary artery  disease, GERD, heart failure preserved ejection fraction, uncontrolled type II diabetes mellitus, insulin -dependent type II diabetes mellitus presented to ED after having a syncopal episode resulted in fall causing pain in bilateral feet and ankle, unable to bear weight on left ankle.  Patient felt lightheadedness while walking towards her car and then passed out. Workup showed ejection fraction 60 to 65%, grade 1 diastolic dysfunction, no valvular disease.  Telemetry did not show any arrhythmia. Patient also sustained a fracture in the left fifth metatarsal. Patient also seen by podiatry, no need for surgery.  Seen by PT, recommended nursing home placement.  Medically stable for transfer today.     Assessment and Plan: Near syncope Syncope ruled out. Patient remember falling, never lost consciousness.  She states that that she was walking to her car in a very hot weather, sweaty.  Then she fell lightheaded and weak, could not sustain herself and fell on the ground. CT head with no acute abnormality and EKG with no acute changes. Patient has an history of CVA with some residual left-sided weakness. Echocardiogram with normal EF and grade 1 diastolic dysfunction.  No other significant abnormality. Patient is working with physical therapy, no dizziness.  Condition resolved.   Fall at home, initial encounter Acute mildly displaced fracture at the base of the fifth metatarsal.  All other imaging negative for any more bony abnormality.  PT and OT  are recommending SNF -Podiatry was consulted and they were recommending cam boot and outpatient follow-up Continue pain medicine and PT/OT.   Essential hypertension, benign Blood pressure stable on home medicines   Chronic diastolic CHF (congestive heart failure) (HCC) Repeat echocardiogram with grade 1 diastolic dysfunction and normal EF. Stable without volume overload.   CAD (coronary artery disease) No chest pain.   - Continue home Plavix  and  Crestor    GERD without esophagitis - Continue with PPI   Hemiparesis affecting left side as late effect of stroke [name]) Patient has residual left-sided weakness from prior stroke.  No acute concern. - Continue home Plavix  and statin   Uncontrolled type 2 diabetes mellitus with hyperglycemia (HCC) Resumed home dose which he appeared to be more appropriate   Dyslipidemia associated with type 2 diabetes mellitus (HCC) - Continue with Zetia  and Crestor    OSA (obstructive sleep apnea) - CPAP at night   Chronic pain Patient is on multiple pain medications which are being managed by outpatient pain specialist. - Continue home meds   Class 2 severe obesity with serious comorbidity and body mass index (BMI) of 39.0 to 39.9 in adult [facility]) Estimated body mass index is 37.93 kg/m as calculated from the following:   Height as of this encounter: 5' 6 (1.676 m).   Weight as of this encounter: 106.6 kg.  Diet and exercise advised   Epilepsy idiopathic petit mal (HCC) No seizure before admission.  Stable, continue home medicines       Consultants: Podiatry Procedures performed: None  Disposition: Skilled nursing facility   Current Outpatient Medications:    ACCU-CHEK GUIDE TEST test strip, USE 1 STRIP IN THE MORNING, AT  NOON, AND AT BEDTIME, Disp: 100 strip, Rfl: 9   albuterol  (ACCUNEB ) 1.25 MG/3ML nebulizer solution, USE 1 VIAL VIA NEBULIZER TWICE  DAILY, Disp: 75 mL, Rfl: 0   albuterol  (VENTOLIN  HFA) 108 (90 Base) MCG/ACT inhaler, USE 1 TO 2 INHALATIONS BY MOUTH  EVERY 6 HOURS AS NEEDED, Disp: 17 g, Rfl: 2   amitriptyline  (ELAVIL ) 25 MG tablet, Take 2-3 tablets (50-75 mg total) by mouth at bedtime., Disp: 90 tablet, Rfl: 5   amLODipine  (NORVASC ) 10 MG tablet, TAKE 1 TABLET BY MOUTH DAILY, Disp: 90 tablet, Rfl: 1   buprenorphine  (BUTRANS ) 7.5 MCG/HR, Place 1 patch onto the skin once a week., Disp: 2 patch, Rfl: 0   butalbital -acetaminophen -caffeine  (FIORICET ) 50-325-40 MG tablet, Take  1-2 tablets by mouth every 6 (six) hours as needed for headache., Disp: 10 tablet, Rfl: 0   carvedilol  (COREG ) 25 MG tablet, Take 25 mg by mouth 2 (two) times daily., Disp: , Rfl:    cholecalciferol  (VITAMIN D3) 25 MCG (1000 UNIT) tablet, Take 1 tablet (1,000 Units total) by mouth daily., Disp: 90 tablet, Rfl: 1   clopidogrel  (PLAVIX ) 75 MG tablet, Take 1 tablet (75 mg total) by mouth daily., Disp: 90 tablet, Rfl: 0   Continuous Glucose Sensor (FREESTYLE LIBRE 3 PLUS SENSOR) MISC, Inject 1 Device into the skin continuous. Change every 14 days, Disp: 6 each, Rfl: 3   Continuous Glucose Sensor (FREESTYLE LIBRE 3 SENSOR) MISC, 1 Device by Does not apply route continuous., Disp: 6 each, Rfl: 3   cycloSPORINE  (RESTASIS ) 0.05 % ophthalmic emulsion, Place 2 drops into both eyes 2 (two) times daily., Disp: , Rfl:    Dulaglutide  (TRULICITY ) 4.5 MG/0.5ML SOAJ, Inject 4.5 mg as directed once a week., Disp: 6 mL, Rfl: 3   DULoxetine  (CYMBALTA ) 60 MG capsule, TAKE 1  CAPSULE BY MOUTH EVERY DAY, Disp: 90 capsule, Rfl: 0   EPINEPHrine  0.3 mg/0.3 mL IJ SOAJ injection, INJECT 1 PEN INTRAMUSCULARLY AS  NEEDED FOR ALLERGIC RESPONSE AS  DIRECTED BY MD. SEEK MEDICAL  HELP AFTER USE, Disp: 2 each, Rfl: 1   ezetimibe  (ZETIA ) 10 MG tablet, TAKE 1 TABLET BY MOUTH DAILY, Disp: 100 tablet, Rfl: 1   furosemide  (LASIX ) 20 MG tablet, TAKE 1 TABLET BY MOUTH DAILY, Disp: 90 tablet, Rfl: 1   gabapentin  (NEURONTIN ) 600 MG tablet, 600 mg qAM, 600 mg qPM, 1200 mg QHS, Disp: 120 tablet, Rfl: 5   Glucagon  (GVOKE HYPOPEN  2-PACK) 1 MG/0.2ML SOAJ, INJECT SUB-Q 1MG  ONCE AS NEEDED  FOR UP TO 1 DOSE. ONLY FOR  SEVERE LOW BLOOD SUGAR NOT  RESPONDING TO DRINKING  JUICE/GLUCOSE TABS, WHEN CBG &lt;70, Disp: 1.2 mL, Rfl: 1   hydrALAZINE  (APRESOLINE ) 50 MG tablet, Take 1 tablet by mouth 3 (three) times daily., Disp: , Rfl:    insulin  glargine, 2 Unit Dial , (TOUJEO  MAX SOLOSTAR) 300 UNIT/ML Solostar Pen, Inject 50 Units into the skin at bedtime. Plus  sliding scale, max dose 80 units a day, Disp: 51 mL, Rfl: 3   insulin  lispro (HUMALOG  KWIKPEN) 100 UNIT/ML KwikPen, Inject 15 Units into the skin 3 (three) times daily. Plus Sliding scale, max dose 80 units a day, Disp: 51 mL, Rfl: 3   Insulin  Pen Needle (PEN NEEDLES) 32G X 4 MM MISC, 1 Device by Does not apply route in the morning, at noon, in the evening, and at bedtime., Disp: 200 each, Rfl: 2   levETIRAcetam  (KEPPRA ) 500 MG tablet, Take 500-750 mg by mouth See admin instructions. Take 1 tablet (500mg ) by mouth every morning and take 1 tablets by mouth (750mg ) by mouth every night, Disp: , Rfl:    meclizine  (ANTIVERT ) 25 MG tablet, Take 1 tablet (25 mg total) by mouth 3 (three) times daily as needed for dizziness., Disp: 30 tablet, Rfl: 0   mirabegron  ER (MYRBETRIQ ) 50 MG TB24 tablet, TAKE 1 TABLET BY MOUTH DAILY, Disp: 100 tablet, Rfl: 1   nitroGLYCERIN  (NITROSTAT ) 0.4 MG SL tablet, Place 0.4 mg under the tongue every 5 (five) minutes as needed for chest pain., Disp: , Rfl:    oxyCODONE  (OXY IR/ROXICODONE ) 5 MG immediate release tablet, Take 1 tablet (5 mg total) by mouth every 6 (six) hours as needed for severe pain (pain score 7-10)., Disp: 12 tablet, Rfl: 0   pantoprazole  (PROTONIX ) 40 MG tablet, TAKE 1 TABLET BY MOUTH DAILY AS  NEEDED, Disp: 100 tablet, Rfl: 2   polyethylene glycol (MIRALAX  / GLYCOLAX ) 17 g packet, Take 17 g by mouth daily as needed for mild constipation., Disp: 100 each, Rfl: 3   rosuvastatin  (CRESTOR ) 40 MG tablet, TAKE 1 TABLET BY MOUTH DAILY, Disp: 100 tablet, Rfl: 2   senna-docusate (SENOKOT-S) 8.6-50 MG tablet, Take 1 tablet by mouth at bedtime as needed for mild constipation., Disp: 60 tablet, Rfl: 0   tiZANidine  (ZANAFLEX ) 4 MG tablet, Take 1 tablet (4 mg total) by mouth every 8 (eight) hours as needed for muscle spasms., Disp: 90 tablet, Rfl: 2   Ubrogepant  (UBRELVY ) 50 MG TABS, 50 mg or 100 mg taken orally with or without food. If needed, a second dose may be taken at  least 2 hours after the initial dose. The maximum dose in a 24-hour period is 200 mg, Disp: 60 tablet, Rfl: 2   WIXELA INHUB 250-50 MCG/DOSE AEPB, Inhale 1 puff into the lungs  2 (two) times daily., Disp: , Rfl:   Patient Active Problem List   Diagnosis Date Noted   Closed displaced fracture of fifth metatarsal bone of left foot 07/27/2023   History of falling 07/25/2023   Class 2 severe obesity with serious comorbidity and body mass index (BMI) of 39.0 to 39.9 in adult (HCC) 03/15/2023   Unspecified visual loss 01/20/2023   Insomnia, unspecified 01/20/2023   Gastro-esophageal reflux disease without esophagitis 04/23/2021   Chronic diastolic CHF (congestive heart failure) (HCC) 04/23/2021   Cervical spondylosis 02/12/2021   Lumbar facet arthropathy 02/12/2021   Adrenal adenoma, right    PAD (peripheral artery disease) (HCC) 01/24/2020   Type 2 diabetes mellitus with hyperglycemia (HCC) 05/16/2019   Athscl heart disease of native coronary artery w/o ang pctrs 04/27/2019   Vitamin D  deficiency 01/24/2018   Overactive bladder 05/18/2017   Dilated aortic root (HCC)    Sacroiliac dysfunction 10/09/2014   Hemiparesis affecting left side as late effect of stroke (HCC) 09/03/2014   OSA (obstructive sleep apnea) 05/15/2014   Dyslipidemia associated with type 2 diabetes mellitus (HCC) 01/21/2014   Essential hypertension, benign 01/21/2014   Chronic pain syndrome 01/21/2014   Headache disorder 09/07/2013   Mixed urge and stress incontinence 08/25/2011   BLINDNESS, LEGAL, [place]  DEFINITION 09/14/2006   Mild episode of recurrent major depressive disorder (HCC) 09/13/2006   Epilepsy idiopathic petit mal (HCC) 09/13/2006    Past Surgical History:  Procedure Laterality Date   BRAIN HEMATOMA EVACUATION     five procedures total, first procedure when 101 months old, last at 58 years of age.   CYSTOSCOPY W/ RETROGRADES  09/24/2011   Procedure: CYSTOSCOPY WITH RETROGRADE PYELOGRAM;  Surgeon: Norleen JINNY Seltzer,  MD;  Location: [facility];  Service: Urology;  Laterality: Right;   CYSTOSCOPY WITH URETEROSCOPY  09/24/2011   Procedure: CYSTOSCOPY WITH URETEROSCOPY;  Surgeon: Norleen JINNY Seltzer, MD;  Location: [facility];  Service: Urology;  Laterality: Right;  Balloon dilation right ureter    CYSTOSCOPY/RETROGRADE/URETEROSCOPY  08/24/2011   Procedure: CYSTOSCOPY/RETROGRADE/URETEROSCOPY;  Surgeon: Toribio Neysa Repine, MD;  Location: [facility];  Service: Urology;  Laterality: Right;  Cysto, Right retrograde Pyelogram, right stent placement.    INCISIONAL HERNIA REPAIR  05/05/2011   Procedure: LAPAROSCOPIC INCISIONAL HERNIA;  Surgeon: Donnice Bury, MD;  Location: [facility] OR;  Service: General;  Laterality: N/A;   kidney stone removal     LAPAROTOMY N/A 06/12/2021   Procedure: EXPLORATORY LAPAROTOMY, LYSIS OF ADHESIONS;  Surgeon: Desiderio Schanz, MD;  Location: [facility];  Service: General;  Laterality: N/A;   MASS EXCISION  05/05/2011   Procedure: EXCISION MASS;  Surgeon: Donnice Bury, MD;  [facility] OR;  Service: General;  Laterality: Right;   PCNL     RETINAL DETACHMENT SURGERY Left 1990   RIGHT/LEFT HEART CATH AND CORONARY ANGIOGRAPHY N/A 04/28/2019   Procedure: RIGHT/LEFT HEART CATH AND CORONARY ANGIOGRAPHY;  Surgeon: Florencio Cara BIRCH, MD;  Location: [facility] INVAS[facility] CV [facility];  Service: Cardiovascular;  Laterality: N/A;   SHUNT REMOVAL     shunt inserted at age 69 removed at age 25    VAGINAL HYSTERECTOMY  42    Family History  Problem Relation Age of Onset   Uterine cancer Mother    Hypertension Mother    Cancer Mother    Brain cancer Maternal Grandmother    Hypertension Maternal Grandmother    Birth defects Daughter    Hypertension Daughter    Cirrhosis Maternal Grandfather    ADD / ADHD Maternal  Grandfather    Birth defects Maternal Grandfather    Diabetes Maternal Grandfather    Anesthesia problems Neg Hx    Colon cancer Neg Hx    Esophageal cancer Neg Hx    Pancreatic cancer Neg Hx     Social History    Tobacco Use   Smoking status: Former    Current packs/day: 0.00    Average packs/day: 1 pack/day for 15.0 years (15.0 ttl pk-yrs)    Types: Cigarettes    Start date: 01/20/1972    Quit date: 01/20/1987    Years since quitting: 36.6   Smokeless tobacco: Never   Tobacco comments:    quit more than 20 years  Vaping Use   Vaping status: Never Used  Substance Use Topics   Alcohol  use: Not Currently    Alcohol /week: 1.0 standard drink of alcohol     Types: 1 Glasses of wine per week    Comment: occ   Drug use: No    Comment: hx of marijuana use no longer uses      Allergies  Allergen Reactions   Codeine Swelling and Other (See Comments)    Swelling and burning of mouth (inside)   Farxiga  [Dapagliflozin ] Other (See Comments)    Recurrent UTI with farxiga    Lactose Intolerance (Gi) Nausea And Vomiting    Health Maintenance  Topic Date Due   MAMMOGRAM  08/12/2017   Cervical Cancer Screening (Pap smear)  06/23/2021   Cervical Cancer Screening (HPV/Pap Cotest)  06/23/2021   OPHTHALMOLOGY EXAM  09/18/2022   Diabetic kidney evaluation - Urine ACR  11/18/2022   COVID-19 Vaccine (4 - 2024-25 season) 09/11/2023 (Originally 09/20/2022)   Zoster Vaccines- Shingrix  (1 of 2) 11/26/2023 (Originally 09/22/1983)   INFLUENZA VACCINE  04/18/2024 (Originally 08/20/2023)   Hepatitis B Vaccines 19-59 Average Risk (1 of 3 - 19+ 3-dose series) 08/25/2024 (Originally 09/22/1983)   FOOT EXAM  12/13/2023   HEMOGLOBIN A1C  02/27/2024   Medicare Annual Wellness (AWV)  03/31/2024   Pneumococcal Vaccine: 50+ Years (3 of 3 - PCV20 or PCV21) 04/28/2024   Diabetic kidney evaluation - eGFR measurement  08/26/2024   Colonoscopy  01/20/2027   DTaP/Tdap/Td (2 - Td or Tdap) 06/22/2028   Hepatitis C Screening  Completed   HIV Screening  Completed   HPV VACCINES  Aged Out   Meningococcal B Vaccine  Aged Out    Chart Review Today: I personally reviewed active problem list, medication list, allergies, family history,  social history, health maintenance, notes from last encounter, lab results, imaging with the patient/caregiver today.   Review of Systems  Constitutional: Negative.   HENT: Negative.    Eyes: Negative.   Respiratory: Negative.    Cardiovascular: Negative.   Gastrointestinal: Negative.   Endocrine: Negative.   Genitourinary: Negative.   Musculoskeletal: Negative.   Skin: Negative.   Allergic/Immunologic: Negative.   Neurological: Negative.   Hematological: Negative.   Psychiatric/Behavioral: Negative.    All other systems reviewed and are negative.    Objective:   Vitals:   08/27/23 1404  BP: 136/74  Pulse: 90  Resp: 16  SpO2: 96%  Weight: 243 lb (110.2 kg)  Height: 5' 6 (1.676 m)    Body mass index is 39.22 kg/m.  Physical Exam Vitals and nursing note reviewed.  Constitutional:      General: She is not in acute distress.    Appearance: She is well-developed and well-groomed. She is obese. She is not ill-appearing, toxic-appearing or diaphoretic.  HENT:  Head: Normocephalic and atraumatic.     Nose: Nose normal.  Eyes:     General:        Right eye: No discharge.        Left eye: No discharge.     Conjunctiva/sclera: Conjunctivae normal.  Neck:     Trachea: No tracheal deviation.  Cardiovascular:     Rate and Rhythm: Normal rate and regular rhythm.  Pulmonary:     Effort: Pulmonary effort is normal. No respiratory distress.     Breath sounds: No stridor.  Skin:    General: Skin is warm and dry.     Findings: No rash.  Neurological:     Mental Status: She is alert.     Motor: No abnormal muscle tone.     Coordination: Coordination normal.     Gait: Gait abnormal.  Psychiatric:        Behavior: Behavior normal. Behavior is cooperative.        Results for orders placed or performed during the hospital encounter of 07/25/23  Basic metabolic panel   Collection Time: 07/25/23  6:46 PM  Result Value Ref Range   Sodium 139 135 - 145 mmol/L    Potassium 4.1 3.5 - 5.1 mmol/L   Chloride 104 98 - 111 mmol/L   CO2 26 22 - 32 mmol/L   Glucose, Bld 313 (H) 70 - 99 mg/dL   BUN 16 6 - 20 mg/dL   Creatinine, Ser 9.06 0.44 - 1.00 mg/dL   Calcium  8.8 (L) 8.9 - 10.3 mg/dL   GFR, Estimated >39 >39 mL/min   Anion gap 9 5 - 15  CBC   Collection Time: 07/25/23  6:46 PM  Result Value Ref Range   WBC 11.2 (H) 4.0 - 10.5 K/uL   RBC 4.54 3.87 - 5.11 MIL/uL   Hemoglobin 13.0 12.0 - 15.0 g/dL   HCT 58.5 63.9 - 53.9 %   MCV 91.2 80.0 - 100.0 fL   MCH 28.6 26.0 - 34.0 pg   MCHC 31.4 30.0 - 36.0 g/dL   RDW 85.8 88.4 - 84.4 %   Platelets 291 150 - 400 K/uL   nRBC 0.0 0.0 - 0.2 %  Troponin I (High Sensitivity)   Collection Time: 07/25/23  6:46 PM  Result Value Ref Range   Troponin I (High Sensitivity) 16 <18 ng/L  CBG monitoring, ED   Collection Time: 07/26/23  2:23 AM  Result Value Ref Range   Glucose-Capillary 218 (H) 70 - 99 mg/dL  CBC   Collection Time: 07/26/23  4:30 AM  Result Value Ref Range   WBC 8.7 4.0 - 10.5 K/uL   RBC 4.17 3.87 - 5.11 MIL/uL   Hemoglobin 11.9 (L) 12.0 - 15.0 g/dL   HCT 61.9 63.9 - 53.9 %   MCV 91.1 80.0 - 100.0 fL   MCH 28.5 26.0 - 34.0 pg   MCHC 31.3 30.0 - 36.0 g/dL   RDW 85.5 88.4 - 84.4 %   Platelets 269 150 - 400 K/uL   nRBC 0.0 0.0 - 0.2 %  Basic metabolic panel with GFR   Collection Time: 07/26/23  4:30 AM  Result Value Ref Range   Sodium 137 135 - 145 mmol/L   Potassium 3.7 3.5 - 5.1 mmol/L   Chloride 102 98 - 111 mmol/L   CO2 25 22 - 32 mmol/L   Glucose, Bld 180 (H) 70 - 99 mg/dL   BUN 16 6 - 20 mg/dL   Creatinine, Ser 9.16 0.44 -  1.00 mg/dL   Calcium  8.4 (L) 8.9 - 10.3 mg/dL   GFR, Estimated >39 >39 mL/min   Anion gap 10 5 - 15  D-dimer, quantitative   Collection Time: 07/26/23  4:30 AM  Result Value Ref Range   D-Dimer, Quant 0.79 (H) 0.00 - 0.50 ug/mL-FEU  Brain natriuretic peptide   Collection Time: 07/26/23  4:30 AM  Result Value Ref Range   B Natriuretic Peptide 18.4 0.0 -  100.0 pg/mL  CBG monitoring, ED   Collection Time: 07/26/23  8:13 AM  Result Value Ref Range   Glucose-Capillary 135 (H) 70 - 99 mg/dL  CBG monitoring, ED   Collection Time: 07/26/23 12:05 PM  Result Value Ref Range   Glucose-Capillary 163 (H) 70 - 99 mg/dL  ECHOCARDIOGRAM COMPLETE   Collection Time: 07/26/23  1:06 PM  Result Value Ref Range   Weight 3,760 oz   Height 66 in   BP 122/76 mmHg   Ao pk vel 1.06 m/s   AV Area VTI 3.97 cm2   AR max vel 3.94 cm2   AV Mean grad 3.0 mmHg   AV Peak grad 4.5 mmHg   Single Plane A2C EF 55.2 %   Single Plane A4C EF 27.7 %   Calc EF 43.0 %   S' Lateral 2.04 cm   AV Area mean vel 3.96 cm2   Area-P 1/2 3.56 cm2   MV VTI 2.95 cm2   Est EF 55 - 60%   CBG monitoring, ED   Collection Time: 07/26/23  4:29 PM  Result Value Ref Range   Glucose-Capillary 206 (H) 70 - 99 mg/dL  Glucose, capillary   Collection Time: 07/26/23  5:42 PM  Result Value Ref Range   Glucose-Capillary 291 (H) 70 - 99 mg/dL  Glucose, capillary   Collection Time: 07/26/23  8:11 PM  Result Value Ref Range   Glucose-Capillary 233 (H) 70 - 99 mg/dL  HIV Antibody (routine testing w rflx)   Collection Time: 07/26/23  8:16 PM  Result Value Ref Range   HIV Screen 4th Generation wRfx Non Reactive Non Reactive  Comprehensive metabolic panel   Collection Time: 07/26/23  8:16 PM  Result Value Ref Range   Sodium 138 135 - 145 mmol/L   Potassium 3.7 3.5 - 5.1 mmol/L   Chloride 103 98 - 111 mmol/L   CO2 25 22 - 32 mmol/L   Glucose, Bld 242 (H) 70 - 99 mg/dL   BUN 21 (H) 6 - 20 mg/dL   Creatinine, Ser 9.19 0.44 - 1.00 mg/dL   Calcium  8.6 (L) 8.9 - 10.3 mg/dL   Total Protein 7.1 6.5 - 8.1 g/dL   Albumin  3.2 (L) 3.5 - 5.0 g/dL   AST 18 15 - 41 U/L   ALT 13 0 - 44 U/L   Alkaline Phosphatase 78 38 - 126 U/L   Total Bilirubin 0.4 0.0 - 1.2 mg/dL   GFR, Estimated >39 >39 mL/min   Anion gap 10 5 - 15  Glucose, capillary   Collection Time: 07/27/23  7:32 AM  Result Value Ref  Range   Glucose-Capillary 151 (H) 70 - 99 mg/dL  Glucose, capillary   Collection Time: 07/27/23 11:28 AM  Result Value Ref Range   Glucose-Capillary 268 (H) 70 - 99 mg/dL  Glucose, capillary   Collection Time: 07/27/23  4:56 PM  Result Value Ref Range   Glucose-Capillary 261 (H) 70 - 99 mg/dL  Glucose, capillary   Collection Time: 07/27/23  8:30 PM  Result Value Ref Range   Glucose-Capillary 218 (H) 70 - 99 mg/dL  Glucose, capillary   Collection Time: 07/28/23  7:50 AM  Result Value Ref Range   Glucose-Capillary 166 (H) 70 - 99 mg/dL  Glucose, capillary   Collection Time: 07/28/23 11:20 AM  Result Value Ref Range   Glucose-Capillary 185 (H) 70 - 99 mg/dL  Glucose, capillary   Collection Time: 07/28/23  4:20 PM  Result Value Ref Range   Glucose-Capillary 232 (H) 70 - 99 mg/dL  Glucose, capillary   Collection Time: 07/28/23  9:37 PM  Result Value Ref Range   Glucose-Capillary 240 (H) 70 - 99 mg/dL  Glucose, capillary   Collection Time: 07/29/23  8:04 AM  Result Value Ref Range   Glucose-Capillary 261 (H) 70 - 99 mg/dL  Glucose, capillary   Collection Time: 07/29/23 12:23 PM  Result Value Ref Range   Glucose-Capillary 217 (H) 70 - 99 mg/dL  Glucose, capillary   Collection Time: 07/29/23  5:18 PM  Result Value Ref Range   Glucose-Capillary 248 (H) 70 - 99 mg/dL  Glucose, capillary   Collection Time: 07/29/23  9:46 PM  Result Value Ref Range   Glucose-Capillary 269 (H) 70 - 99 mg/dL  Glucose, capillary   Collection Time: 07/30/23  7:55 AM  Result Value Ref Range   Glucose-Capillary 303 (H) 70 - 99 mg/dL  Glucose, capillary   Collection Time: 07/30/23 11:16 AM  Result Value Ref Range   Glucose-Capillary 257 (H) 70 - 99 mg/dL   *Note: Due to a large number of results and/or encounters for the requested time period, some results have not been displayed. A complete set of results can be found in Results Review.      Assessment & Plan:    Assessment & Plan       Encounter for support and coordination of transition of care    -  Primary   TOC OV done today - over a month of inpt and SNF info to review, pt at home now cannot drive but has help from daughter Transition of care after recent hospitalization and rehabilitation Discharged from [facility] on July 26 after hospitalization for syncope and inability to walk. - Review and copy discharge paperwork from [facility]. - Review hospital labs and order necessary follow-up labs. Admit 07/25/2023 to 07/30/2023 d/c to SNF - Complete FMLA paperwork for daughter's caregiving needs - estimate increased care required for at least 6 months needing to see cardiology, neurology, endo, ortho, PT - Schedule follow-up appointment for comprehensive review of chronic conditions.       Hospital discharge follow-up       see above, TOC call done and OV done      AKI (acute kidney injury) (HCC)       labs in hospital did improve back to baseline - recheck and monitor. Lab Results  Component Value Date   EGFR 74 08/27/2023   EGFR 60 03/15/2023   EGFR 70 01/23/2022   EGFR 64 11/17/2021   EGFR 49 (L) 06/02/2021         Encounter for screening mammogram for malignant neoplasm of breast       order previously done and active - pt can call to set up      Encounter for completion of form with patient       FMLA forms for pt's daughter received today and to be completed w/in 7 business days and submitted        Problem  List Items Addressed This Visit     Chronic diastolic CHF (congestive heart failure) (HCC) (Chronic) - per cardiology pt appear euvolemic     Dyslipidemia associated with type 2 diabetes mellitus (HCC)   Managed on rosuvastatin  40 mg and Zetia  with encouraged continued diet/lifestyle efforts as able Lab Results  Component Value Date   CHOL 129 02/16/2023   HDL 45 (L) 02/16/2023   LDLCALC 60 02/16/2023   TRIG 166 (H) 02/16/2023   CHOLHDL 2.9 02/16/2023         Relevant Orders   HgB A1c (Completed)    Comprehensive Metabolic Panel (CMET) (Completed)   Urine Microalbumin w/creat. ratio   Essential hypertension, benign   Managed with amlodipine  10 mg daily, carvedilol  25 mg twice daily hydralazine  50 mg 3 times daily and furosemide  for CHF Only amlodipine  is rx from PCP remaining from her cardiologist Hx of HTN urgency and hypotension with syncope - labile Today BP near goal Continue same meds/doses and keep reg follow-up with cardiology BP Readings from Last 3 Encounters:  08/27/23 136/74  07/30/23 123/70  06/15/23 (!) 194/122         Chronic pain syndrome   Est with pain management specialists and inpt and SNF pt had IR oxy Pain managed with oxycodone  during hospitalization and rehabilitation. Transitioning to as-needed use to avoid withdrawal and minimize use. - she is est with pain management and all pain meds out of SNF/hospital should be coordinated with specialists - Encourage follow-up with pain management for ongoing pain control strategies.  Explained I cannot refill oxy, encouraged her to use sparingly and coordinate plan with podiatry and pain management  On chronic pain meds, muscle relaxers, gabapentin  she was previously on Cymbalta        Hemiparesis affecting left side as late effect of stroke [facility])   Patient has residual left-sided weakness from prior stroke.  No acute concern. - Continue home Plavix  and statin        Type 2 diabetes mellitus with hyperglycemia (HCC)   Per endo  On basal and SSI GLP-1  Uncontrolled Insulin -dependent with recent blood sugars mostly in the 200s. Last A1c not at goal. Uses Libre and finger stick for monitoring. Concerned about A1c rising to 14 and symptomatic with high or low blood sugar. - managed by endocrinology - Order A1c test to assess current glycemic control. - Ensure she has all necessary insulin  supplies and medications. On statin Previously on ARB but not on chart for last year or so - will check and review chart to  see circumstance around when she was taken off it - she is often concerned about renal function Due for UACR test       Class 2 severe obesity with serious comorbidity and body mass index (BMI) of 39.0 to 39.9 in adult [facility])   Wt Readings from Last 5 Encounters:  08/27/23 243 lb (110.2 kg)  07/25/23 235 lb (106.6 kg)  06/15/23 235 lb 14.4 oz (107 kg)  06/15/23 244 lb (110.7 kg)  06/12/23 240 lb 4.8 oz (109 kg)   BMI Readings from Last 5 Encounters:  08/27/23 39.22 kg/m  07/25/23 37.93 kg/m  06/15/23 38.08 kg/m  06/15/23 39.38 kg/m  06/12/23 38.79 kg/m         Epilepsy idiopathic petit mal (HCC)   Managed by neurology on keppra       Closed displaced fracture of fifth metatarsal bone of left foot   Managed by podiatry, consulting with PT F/up with specialists  History of falling   High fall risk, multiple falls with injury and syncope Polypharmacy a concern - pain and seizure meds, muscle relaxers      Polypharmacy   Concern for polypharmacy for this patient with history of falls, syncope, seizures, chronic pain meds, current management includes muscle relaxers pain meds, gabapentin , meclizine  additionally she has history of headaches with prescription for Fioricet             Return in about 3 months (around 11/27/2023) for Routine follow-up.   Michelene Cower, [name] 08/27/23 2:17 PM

## 2023-08-28 LAB — COMPREHENSIVE METABOLIC PANEL WITH GFR
AG Ratio: 1.2 (calc) (ref 1.0–2.5)
ALT: 41 U/L — ABNORMAL HIGH (ref 6–29)
AST: 31 U/L (ref 10–35)
Albumin: 3.7 g/dL (ref 3.6–5.1)
Alkaline phosphatase (APISO): 96 U/L (ref 37–153)
BUN: 12 mg/dL (ref 7–25)
CO2: 29 mmol/L (ref 20–32)
Calcium: 8.6 mg/dL (ref 8.6–10.4)
Chloride: 100 mmol/L (ref 98–110)
Creat: 0.9 mg/dL (ref 0.50–1.03)
Globulin: 3 g/dL (ref 1.9–3.7)
Glucose, Bld: 206 mg/dL — ABNORMAL HIGH (ref 65–99)
Potassium: 4 mmol/L (ref 3.5–5.3)
Sodium: 137 mmol/L (ref 135–146)
Total Bilirubin: 0.6 mg/dL (ref 0.2–1.2)
Total Protein: 6.7 g/dL (ref 6.1–8.1)
eGFR: 74 mL/min/1.73m2 (ref >=60)

## 2023-08-28 LAB — HEMOGLOBIN A1C
Hgb A1c MFr Bld: 9.9 % — ABNORMAL HIGH (ref <5.7)
Mean Plasma Glucose: 237 mg/dL
eAG (mmol/L): 13.2 mmol/L

## 2023-08-30 ENCOUNTER — Ambulatory Visit: Payer: Self-pay | Admitting: Family Medicine

## 2023-08-30 ENCOUNTER — Encounter: Payer: Self-pay | Admitting: Family Medicine

## 2023-09-01 DIAGNOSIS — E1142 Type 2 diabetes mellitus with diabetic polyneuropathy: Secondary | ICD-10-CM | POA: Diagnosis not present

## 2023-09-01 DIAGNOSIS — J440 Chronic obstructive pulmonary disease with acute lower respiratory infection: Secondary | ICD-10-CM | POA: Diagnosis not present

## 2023-09-01 DIAGNOSIS — N3281 Overactive bladder: Secondary | ICD-10-CM | POA: Diagnosis not present

## 2023-09-01 DIAGNOSIS — Z9181 History of falling: Secondary | ICD-10-CM | POA: Diagnosis not present

## 2023-09-01 DIAGNOSIS — E785 Hyperlipidemia, unspecified: Secondary | ICD-10-CM | POA: Diagnosis not present

## 2023-09-01 DIAGNOSIS — M62838 Other muscle spasm: Secondary | ICD-10-CM | POA: Diagnosis not present

## 2023-09-01 DIAGNOSIS — M778 Other enthesopathies, not elsewhere classified: Secondary | ICD-10-CM | POA: Diagnosis not present

## 2023-09-01 DIAGNOSIS — G4733 Obstructive sleep apnea (adult) (pediatric): Secondary | ICD-10-CM | POA: Diagnosis not present

## 2023-09-01 DIAGNOSIS — I2510 Atherosclerotic heart disease of native coronary artery without angina pectoris: Secondary | ICD-10-CM | POA: Diagnosis not present

## 2023-09-01 DIAGNOSIS — E1165 Type 2 diabetes mellitus with hyperglycemia: Secondary | ICD-10-CM | POA: Diagnosis not present

## 2023-09-01 DIAGNOSIS — N181 Chronic kidney disease, stage 1: Secondary | ICD-10-CM | POA: Diagnosis not present

## 2023-09-01 DIAGNOSIS — E1122 Type 2 diabetes mellitus with diabetic chronic kidney disease: Secondary | ICD-10-CM | POA: Diagnosis not present

## 2023-09-01 DIAGNOSIS — I5022 Chronic systolic (congestive) heart failure: Secondary | ICD-10-CM | POA: Diagnosis not present

## 2023-09-01 DIAGNOSIS — K219 Gastro-esophageal reflux disease without esophagitis: Secondary | ICD-10-CM | POA: Diagnosis not present

## 2023-09-01 DIAGNOSIS — I69354 Hemiplegia and hemiparesis following cerebral infarction affecting left non-dominant side: Secondary | ICD-10-CM | POA: Diagnosis not present

## 2023-09-01 DIAGNOSIS — G4700 Insomnia, unspecified: Secondary | ICD-10-CM | POA: Diagnosis not present

## 2023-09-01 DIAGNOSIS — I130 Hypertensive heart and chronic kidney disease with heart failure and stage 1 through stage 4 chronic kidney disease, or unspecified chronic kidney disease: Secondary | ICD-10-CM | POA: Diagnosis not present

## 2023-09-01 DIAGNOSIS — H547 Unspecified visual loss: Secondary | ICD-10-CM | POA: Diagnosis not present

## 2023-09-01 DIAGNOSIS — S92352D Displaced fracture of fifth metatarsal bone, left foot, subsequent encounter for fracture with routine healing: Secondary | ICD-10-CM | POA: Diagnosis not present

## 2023-09-02 ENCOUNTER — Encounter: Payer: Self-pay | Admitting: Family Medicine

## 2023-09-02 DIAGNOSIS — H547 Unspecified visual loss: Secondary | ICD-10-CM | POA: Diagnosis not present

## 2023-09-02 DIAGNOSIS — G4733 Obstructive sleep apnea (adult) (pediatric): Secondary | ICD-10-CM | POA: Diagnosis not present

## 2023-09-02 DIAGNOSIS — E1142 Type 2 diabetes mellitus with diabetic polyneuropathy: Secondary | ICD-10-CM | POA: Diagnosis not present

## 2023-09-02 DIAGNOSIS — I5022 Chronic systolic (congestive) heart failure: Secondary | ICD-10-CM | POA: Diagnosis not present

## 2023-09-02 DIAGNOSIS — E785 Hyperlipidemia, unspecified: Secondary | ICD-10-CM | POA: Diagnosis not present

## 2023-09-02 DIAGNOSIS — I2510 Atherosclerotic heart disease of native coronary artery without angina pectoris: Secondary | ICD-10-CM | POA: Diagnosis not present

## 2023-09-02 DIAGNOSIS — E1165 Type 2 diabetes mellitus with hyperglycemia: Secondary | ICD-10-CM | POA: Diagnosis not present

## 2023-09-02 DIAGNOSIS — I69354 Hemiplegia and hemiparesis following cerebral infarction affecting left non-dominant side: Secondary | ICD-10-CM | POA: Diagnosis not present

## 2023-09-02 DIAGNOSIS — S92352D Displaced fracture of fifth metatarsal bone, left foot, subsequent encounter for fracture with routine healing: Secondary | ICD-10-CM | POA: Diagnosis not present

## 2023-09-02 DIAGNOSIS — G4700 Insomnia, unspecified: Secondary | ICD-10-CM | POA: Diagnosis not present

## 2023-09-02 DIAGNOSIS — N3281 Overactive bladder: Secondary | ICD-10-CM | POA: Diagnosis not present

## 2023-09-02 DIAGNOSIS — Z9181 History of falling: Secondary | ICD-10-CM | POA: Diagnosis not present

## 2023-09-02 DIAGNOSIS — I130 Hypertensive heart and chronic kidney disease with heart failure and stage 1 through stage 4 chronic kidney disease, or unspecified chronic kidney disease: Secondary | ICD-10-CM | POA: Diagnosis not present

## 2023-09-02 DIAGNOSIS — M778 Other enthesopathies, not elsewhere classified: Secondary | ICD-10-CM | POA: Diagnosis not present

## 2023-09-02 DIAGNOSIS — E1122 Type 2 diabetes mellitus with diabetic chronic kidney disease: Secondary | ICD-10-CM | POA: Diagnosis not present

## 2023-09-02 DIAGNOSIS — N181 Chronic kidney disease, stage 1: Secondary | ICD-10-CM | POA: Diagnosis not present

## 2023-09-02 DIAGNOSIS — M62838 Other muscle spasm: Secondary | ICD-10-CM | POA: Diagnosis not present

## 2023-09-02 DIAGNOSIS — K219 Gastro-esophageal reflux disease without esophagitis: Secondary | ICD-10-CM | POA: Diagnosis not present

## 2023-09-02 DIAGNOSIS — J440 Chronic obstructive pulmonary disease with acute lower respiratory infection: Secondary | ICD-10-CM | POA: Diagnosis not present

## 2023-09-03 NOTE — Telephone Encounter (Signed)
 Patient notified yesterday forms are completed and notified this morning they were faxed. Copy scanned into chart.

## 2023-09-06 DIAGNOSIS — I5022 Chronic systolic (congestive) heart failure: Secondary | ICD-10-CM | POA: Diagnosis not present

## 2023-09-06 DIAGNOSIS — E1165 Type 2 diabetes mellitus with hyperglycemia: Secondary | ICD-10-CM | POA: Diagnosis not present

## 2023-09-06 DIAGNOSIS — I69354 Hemiplegia and hemiparesis following cerebral infarction affecting left non-dominant side: Secondary | ICD-10-CM | POA: Diagnosis not present

## 2023-09-06 DIAGNOSIS — E785 Hyperlipidemia, unspecified: Secondary | ICD-10-CM | POA: Diagnosis not present

## 2023-09-06 DIAGNOSIS — I130 Hypertensive heart and chronic kidney disease with heart failure and stage 1 through stage 4 chronic kidney disease, or unspecified chronic kidney disease: Secondary | ICD-10-CM | POA: Diagnosis not present

## 2023-09-06 DIAGNOSIS — I2510 Atherosclerotic heart disease of native coronary artery without angina pectoris: Secondary | ICD-10-CM | POA: Diagnosis not present

## 2023-09-06 DIAGNOSIS — K219 Gastro-esophageal reflux disease without esophagitis: Secondary | ICD-10-CM | POA: Diagnosis not present

## 2023-09-06 DIAGNOSIS — G4700 Insomnia, unspecified: Secondary | ICD-10-CM | POA: Diagnosis not present

## 2023-09-06 DIAGNOSIS — M778 Other enthesopathies, not elsewhere classified: Secondary | ICD-10-CM | POA: Diagnosis not present

## 2023-09-06 DIAGNOSIS — S92352D Displaced fracture of fifth metatarsal bone, left foot, subsequent encounter for fracture with routine healing: Secondary | ICD-10-CM | POA: Diagnosis not present

## 2023-09-06 DIAGNOSIS — M62838 Other muscle spasm: Secondary | ICD-10-CM | POA: Diagnosis not present

## 2023-09-06 DIAGNOSIS — G4733 Obstructive sleep apnea (adult) (pediatric): Secondary | ICD-10-CM | POA: Diagnosis not present

## 2023-09-06 DIAGNOSIS — N3281 Overactive bladder: Secondary | ICD-10-CM | POA: Diagnosis not present

## 2023-09-06 DIAGNOSIS — H547 Unspecified visual loss: Secondary | ICD-10-CM | POA: Diagnosis not present

## 2023-09-06 DIAGNOSIS — N181 Chronic kidney disease, stage 1: Secondary | ICD-10-CM | POA: Diagnosis not present

## 2023-09-06 DIAGNOSIS — J440 Chronic obstructive pulmonary disease with acute lower respiratory infection: Secondary | ICD-10-CM | POA: Diagnosis not present

## 2023-09-06 DIAGNOSIS — E1122 Type 2 diabetes mellitus with diabetic chronic kidney disease: Secondary | ICD-10-CM | POA: Diagnosis not present

## 2023-09-06 DIAGNOSIS — Z9181 History of falling: Secondary | ICD-10-CM | POA: Diagnosis not present

## 2023-09-06 DIAGNOSIS — E1142 Type 2 diabetes mellitus with diabetic polyneuropathy: Secondary | ICD-10-CM | POA: Diagnosis not present

## 2023-09-07 ENCOUNTER — Telehealth: Payer: Self-pay

## 2023-09-07 NOTE — Telephone Encounter (Unsigned)
 Copied from CRM [id number]. Topic: General - Other >> Sep 07, 2023  1:12 PM Montie POUR wrote: Reason for CRM:  Ms. Shafer is calling to have the FMLA paperwork corrected. Question #7 needs to be answered as yes and paperwork needs to be re faxed to the number on the forms. Please call Ms. Zirbes with any questions at [phone]. Thanks

## 2023-09-07 NOTE — Telephone Encounter (Signed)
 Forms sent off to scan center. Once copy is scanned in I will review.

## 2023-09-09 ENCOUNTER — Encounter: Payer: Self-pay | Admitting: Family Medicine

## 2023-09-09 ENCOUNTER — Ambulatory Visit: Payer: Self-pay

## 2023-09-09 DIAGNOSIS — J440 Chronic obstructive pulmonary disease with acute lower respiratory infection: Secondary | ICD-10-CM | POA: Diagnosis not present

## 2023-09-09 DIAGNOSIS — I2510 Atherosclerotic heart disease of native coronary artery without angina pectoris: Secondary | ICD-10-CM | POA: Diagnosis not present

## 2023-09-09 DIAGNOSIS — Z79899 Other long term (current) drug therapy: Secondary | ICD-10-CM | POA: Insufficient documentation

## 2023-09-09 DIAGNOSIS — S92352D Displaced fracture of fifth metatarsal bone, left foot, subsequent encounter for fracture with routine healing: Secondary | ICD-10-CM | POA: Diagnosis not present

## 2023-09-09 DIAGNOSIS — H547 Unspecified visual loss: Secondary | ICD-10-CM | POA: Diagnosis not present

## 2023-09-09 DIAGNOSIS — E1122 Type 2 diabetes mellitus with diabetic chronic kidney disease: Secondary | ICD-10-CM | POA: Diagnosis not present

## 2023-09-09 DIAGNOSIS — G4700 Insomnia, unspecified: Secondary | ICD-10-CM | POA: Diagnosis not present

## 2023-09-09 DIAGNOSIS — N3281 Overactive bladder: Secondary | ICD-10-CM | POA: Diagnosis not present

## 2023-09-09 DIAGNOSIS — M62838 Other muscle spasm: Secondary | ICD-10-CM | POA: Diagnosis not present

## 2023-09-09 DIAGNOSIS — K219 Gastro-esophageal reflux disease without esophagitis: Secondary | ICD-10-CM | POA: Diagnosis not present

## 2023-09-09 DIAGNOSIS — I69354 Hemiplegia and hemiparesis following cerebral infarction affecting left non-dominant side: Secondary | ICD-10-CM | POA: Diagnosis not present

## 2023-09-09 DIAGNOSIS — E785 Hyperlipidemia, unspecified: Secondary | ICD-10-CM | POA: Diagnosis not present

## 2023-09-09 DIAGNOSIS — Z9181 History of falling: Secondary | ICD-10-CM | POA: Diagnosis not present

## 2023-09-09 DIAGNOSIS — I5022 Chronic systolic (congestive) heart failure: Secondary | ICD-10-CM | POA: Diagnosis not present

## 2023-09-09 DIAGNOSIS — E1142 Type 2 diabetes mellitus with diabetic polyneuropathy: Secondary | ICD-10-CM | POA: Diagnosis not present

## 2023-09-09 DIAGNOSIS — I130 Hypertensive heart and chronic kidney disease with heart failure and stage 1 through stage 4 chronic kidney disease, or unspecified chronic kidney disease: Secondary | ICD-10-CM | POA: Diagnosis not present

## 2023-09-09 DIAGNOSIS — E1165 Type 2 diabetes mellitus with hyperglycemia: Secondary | ICD-10-CM | POA: Diagnosis not present

## 2023-09-09 DIAGNOSIS — G4733 Obstructive sleep apnea (adult) (pediatric): Secondary | ICD-10-CM | POA: Diagnosis not present

## 2023-09-09 DIAGNOSIS — N181 Chronic kidney disease, stage 1: Secondary | ICD-10-CM | POA: Diagnosis not present

## 2023-09-09 DIAGNOSIS — M778 Other enthesopathies, not elsewhere classified: Secondary | ICD-10-CM | POA: Diagnosis not present

## 2023-09-09 NOTE — Assessment & Plan Note (Signed)
 Est with pain management specialists and inpt and SNF pt had IR oxy Pain managed with oxycodone  during hospitalization and rehabilitation. Transitioning to as-needed use to avoid withdrawal and minimize use. - she is est with pain management and all pain meds out of SNF/hospital should be coordinated with specialists - Encourage follow-up with pain management for ongoing pain control strategies.  Explained I cannot refill oxy, encouraged her to use sparingly and coordinate plan with podiatry and pain management  On chronic pain meds, muscle relaxers, gabapentin  she was previously on Cymbalta 

## 2023-09-09 NOTE — Assessment & Plan Note (Signed)
 Managed by podiatry, consulting with PT F/up with specialists

## 2023-09-09 NOTE — Assessment & Plan Note (Signed)
 Patient has residual left-sided weakness from prior stroke.  No acute concern. - Continue home Plavix  and statin

## 2023-09-09 NOTE — Assessment & Plan Note (Signed)
 Managed with amlodipine  10 mg daily, carvedilol  25 mg twice daily hydralazine  50 mg 3 times daily and furosemide  for CHF Only amlodipine  is rx from PCP remaining from her cardiologist Hx of HTN urgency and hypotension with syncope - labile Today BP near goal Continue same meds/doses and keep reg follow-up with cardiology BP Readings from Last 3 Encounters:  08/27/23 136/74  07/30/23 123/70  06/15/23 (!) 194/122

## 2023-09-09 NOTE — Telephone Encounter (Signed)
 FYI Only or Action Required?: FYI only for provider.  Patient was last seen in primary care on 06/15/2023 by Glenard Mire, MD.  Called Nurse Triage reporting Fall.  Symptoms began today.  Interventions attempted: Ice/heat application.  Symptoms are: stable. [facility]HC nurse present, reports no swelling or abrasions but says patient unable to stand too long  Triage Disposition: See HCP Within 4 Hours (Or PCP Triage)  Patient/caregiver understands and will follow disposition?: Yes- no appts avail, pt agreeable to visit urgent care  Copied from CRM #[id number]. Topic: Clinical - Red Word Triage >> Sep 09, 2023 11:02 AM Zebedee SAUNDERS wrote: Red Word that prompted transfer to Nurse Triage: [facility] Lacie Bertram ph: [phone] calling on behalf of pt that fell within the hour injured right knee with pain level of 9. Reason for Disposition  [1] MODERATE weakness (e.g., interferes with work, school, normal activities) AND [2] new-onset or getting worse  Answer Assessment - Initial Assessment Questions 1. MECHANISM: How did the fall happen?     Walking to the bathroom and felt weak and fell.  2. DOMESTIC VIOLENCE AND ELDER ABUSE SCREENING: Did you fall because someone pushed you or tried to hurt you? If Yes, ask: Are you safe now?     No  3. ONSET: When did the fall happen? (e.g., minutes, hours, or days ago)     Fell 1 hour  4. LOCATION: What part of the body hit the ground? (e.g., back, buttocks, head, hips, knees, hands, head, stomach)     Landed on knees  5. INJURY: Did you hurt (injure) yourself when you fell? If Yes, ask: What did you injure? Tell me more about this? (e.g., body area; type of injury; pain severity)     Knee pain  6. PAIN: Is there any pain? If Yes, ask: How bad is the pain? (e.g., Scale 0-10; or none, mild,      9/10  7. SIZE: For cuts, bruises, or swelling, ask: How large is it? (e.g., inches or centimeters)      *No Answer* 8. PREGNANCY: Is  there any chance you are pregnant? When was your last menstrual period?     *No Answer* 9. OTHER SYMPTOMS: Do you have any other symptoms? (e.g., dizziness, fever, weakness; new-onset or worsening).      Blood sugar fluctuating recently  10. CAUSE: What do you think caused the fall (or falling)? (e.g., dizzy spell, tripped)       *No Answer*  Protocols used: Falls and [facility]

## 2023-09-09 NOTE — Assessment & Plan Note (Signed)
 High fall risk, multiple falls with injury and syncope Polypharmacy a concern - pain and seizure meds, muscle relaxers

## 2023-09-09 NOTE — Assessment & Plan Note (Signed)
 Managed by neurology on keppra 

## 2023-09-09 NOTE — Assessment & Plan Note (Signed)
 Wt Readings from Last 5 Encounters:  08/27/23 243 lb (110.2 kg)  07/25/23 235 lb (106.6 kg)  06/15/23 235 lb 14.4 oz (107 kg)  06/15/23 244 lb (110.7 kg)  06/12/23 240 lb 4.8 oz (109 kg)   BMI Readings from Last 5 Encounters:  08/27/23 39.22 kg/m  07/25/23 37.93 kg/m  06/15/23 38.08 kg/m  06/15/23 39.38 kg/m  06/12/23 38.79 kg/m

## 2023-09-09 NOTE — Assessment & Plan Note (Signed)
 Managed on rosuvastatin  40 mg and Zetia  with encouraged continued diet/lifestyle efforts as able Lab Results  Component Value Date   CHOL 129 02/16/2023   HDL 45 (L) 02/16/2023   LDLCALC 60 02/16/2023   TRIG 166 (H) 02/16/2023   CHOLHDL 2.9 02/16/2023

## 2023-09-09 NOTE — Assessment & Plan Note (Signed)
 Concern for polypharmacy for this patient with history of falls, syncope, seizures, chronic pain meds, current management includes muscle relaxers pain meds, gabapentin , meclizine  additionally she has history of headaches with prescription for Fioricet 

## 2023-09-09 NOTE — Assessment & Plan Note (Signed)
 Per endo  On basal and SSI GLP-1

## 2023-09-10 ENCOUNTER — Ambulatory Visit: Admitting: Family Medicine

## 2023-09-13 ENCOUNTER — Ambulatory Visit: Payer: 59 | Admitting: Family Medicine

## 2023-09-14 ENCOUNTER — Encounter: Payer: Self-pay | Admitting: Family Medicine

## 2023-09-16 ENCOUNTER — Emergency Department

## 2023-09-16 ENCOUNTER — Inpatient Hospital Stay
Admission: EM | Admit: 2023-09-16 | Discharge: 2023-09-18 | DRG: 690 | Disposition: A | Discharge status: Home Health | Attending: Hospitalist | Admitting: Hospitalist

## 2023-09-16 ENCOUNTER — Other Ambulatory Visit: Payer: Self-pay

## 2023-09-16 DIAGNOSIS — Z6838 Body mass index (BMI) 38.0-38.9, adult: Secondary | ICD-10-CM

## 2023-09-16 DIAGNOSIS — H547 Unspecified visual loss: Secondary | ICD-10-CM | POA: Diagnosis not present

## 2023-09-16 DIAGNOSIS — S92352D Displaced fracture of fifth metatarsal bone, left foot, subsequent encounter for fracture with routine healing: Secondary | ICD-10-CM | POA: Diagnosis not present

## 2023-09-16 DIAGNOSIS — E119 Type 2 diabetes mellitus without complications: Secondary | ICD-10-CM

## 2023-09-16 DIAGNOSIS — M62838 Other muscle spasm: Secondary | ICD-10-CM | POA: Diagnosis not present

## 2023-09-16 DIAGNOSIS — I5022 Chronic systolic (congestive) heart failure: Secondary | ICD-10-CM | POA: Diagnosis not present

## 2023-09-16 DIAGNOSIS — Z8249 Family history of ischemic heart disease and other diseases of the circulatory system: Secondary | ICD-10-CM | POA: Diagnosis not present

## 2023-09-16 DIAGNOSIS — Z87891 Personal history of nicotine dependence: Secondary | ICD-10-CM | POA: Diagnosis not present

## 2023-09-16 DIAGNOSIS — E66812 Obesity, class 2: Secondary | ICD-10-CM | POA: Diagnosis present

## 2023-09-16 DIAGNOSIS — N3281 Overactive bladder: Secondary | ICD-10-CM | POA: Diagnosis not present

## 2023-09-16 DIAGNOSIS — I2510 Atherosclerotic heart disease of native coronary artery without angina pectoris: Secondary | ICD-10-CM | POA: Diagnosis not present

## 2023-09-16 DIAGNOSIS — E782 Mixed hyperlipidemia: Secondary | ICD-10-CM | POA: Diagnosis not present

## 2023-09-16 DIAGNOSIS — N390 Urinary tract infection, site not specified: Principal | ICD-10-CM | POA: Diagnosis present

## 2023-09-16 DIAGNOSIS — M1990 Unspecified osteoarthritis, unspecified site: Secondary | ICD-10-CM | POA: Diagnosis present

## 2023-09-16 DIAGNOSIS — Z8673 Personal history of transient ischemic attack (TIA), and cerebral infarction without residual deficits: Secondary | ICD-10-CM

## 2023-09-16 DIAGNOSIS — E785 Hyperlipidemia, unspecified: Secondary | ICD-10-CM | POA: Diagnosis not present

## 2023-09-16 DIAGNOSIS — I5032 Chronic diastolic (congestive) heart failure: Secondary | ICD-10-CM | POA: Diagnosis present

## 2023-09-16 DIAGNOSIS — Z833 Family history of diabetes mellitus: Secondary | ICD-10-CM

## 2023-09-16 DIAGNOSIS — E11319 Type 2 diabetes mellitus with unspecified diabetic retinopathy without macular edema: Secondary | ICD-10-CM | POA: Diagnosis not present

## 2023-09-16 DIAGNOSIS — E46 Unspecified protein-calorie malnutrition: Secondary | ICD-10-CM | POA: Diagnosis not present

## 2023-09-16 DIAGNOSIS — M778 Other enthesopathies, not elsewhere classified: Secondary | ICD-10-CM | POA: Diagnosis not present

## 2023-09-16 DIAGNOSIS — E1122 Type 2 diabetes mellitus with diabetic chronic kidney disease: Secondary | ICD-10-CM | POA: Diagnosis not present

## 2023-09-16 DIAGNOSIS — G40A09 Absence epileptic syndrome, not intractable, without status epilepticus: Secondary | ICD-10-CM | POA: Diagnosis present

## 2023-09-16 DIAGNOSIS — E739 Lactose intolerance, unspecified: Secondary | ICD-10-CM | POA: Diagnosis present

## 2023-09-16 DIAGNOSIS — I110 Hypertensive heart disease with heart failure: Secondary | ICD-10-CM | POA: Diagnosis not present

## 2023-09-16 DIAGNOSIS — N181 Chronic kidney disease, stage 1: Secondary | ICD-10-CM | POA: Diagnosis not present

## 2023-09-16 DIAGNOSIS — G4700 Insomnia, unspecified: Secondary | ICD-10-CM | POA: Diagnosis not present

## 2023-09-16 DIAGNOSIS — R29810 Facial weakness: Secondary | ICD-10-CM | POA: Diagnosis not present

## 2023-09-16 DIAGNOSIS — Z8279 Family history of other congenital malformations, deformations and chromosomal abnormalities: Secondary | ICD-10-CM

## 2023-09-16 DIAGNOSIS — R2990 Unspecified symptoms and signs involving the nervous system: Secondary | ICD-10-CM | POA: Diagnosis not present

## 2023-09-16 DIAGNOSIS — E8809 Other disorders of plasma-protein metabolism, not elsewhere classified: Secondary | ICD-10-CM | POA: Diagnosis not present

## 2023-09-16 DIAGNOSIS — Z7902 Long term (current) use of antithrombotics/antiplatelets: Secondary | ICD-10-CM | POA: Diagnosis not present

## 2023-09-16 DIAGNOSIS — D649 Anemia, unspecified: Secondary | ICD-10-CM | POA: Diagnosis not present

## 2023-09-16 DIAGNOSIS — I10 Essential (primary) hypertension: Secondary | ICD-10-CM | POA: Diagnosis present

## 2023-09-16 DIAGNOSIS — Z7985 Long-term (current) use of injectable non-insulin antidiabetic drugs: Secondary | ICD-10-CM

## 2023-09-16 DIAGNOSIS — R531 Weakness: Secondary | ICD-10-CM

## 2023-09-16 DIAGNOSIS — R6889 Other general symptoms and signs: Secondary | ICD-10-CM | POA: Diagnosis not present

## 2023-09-16 DIAGNOSIS — Z888 Allergy status to other drugs, medicaments and biological substances status: Secondary | ICD-10-CM

## 2023-09-16 DIAGNOSIS — H409 Unspecified glaucoma: Secondary | ICD-10-CM | POA: Diagnosis present

## 2023-09-16 DIAGNOSIS — J440 Chronic obstructive pulmonary disease with acute lower respiratory infection: Secondary | ICD-10-CM | POA: Diagnosis not present

## 2023-09-16 DIAGNOSIS — J4489 Other specified chronic obstructive pulmonary disease: Secondary | ICD-10-CM | POA: Diagnosis not present

## 2023-09-16 DIAGNOSIS — E1140 Type 2 diabetes mellitus with diabetic neuropathy, unspecified: Secondary | ICD-10-CM | POA: Diagnosis not present

## 2023-09-16 DIAGNOSIS — Z794 Long term (current) use of insulin: Secondary | ICD-10-CM | POA: Diagnosis not present

## 2023-09-16 DIAGNOSIS — Z90710 Acquired absence of both cervix and uterus: Secondary | ICD-10-CM

## 2023-09-16 DIAGNOSIS — Z808 Family history of malignant neoplasm of other organs or systems: Secondary | ICD-10-CM

## 2023-09-16 DIAGNOSIS — R4781 Slurred speech: Secondary | ICD-10-CM | POA: Diagnosis not present

## 2023-09-16 DIAGNOSIS — I69354 Hemiplegia and hemiparesis following cerebral infarction affecting left non-dominant side: Secondary | ICD-10-CM | POA: Diagnosis not present

## 2023-09-16 DIAGNOSIS — Z8049 Family history of malignant neoplasm of other genital organs: Secondary | ICD-10-CM

## 2023-09-16 DIAGNOSIS — Z809 Family history of malignant neoplasm, unspecified: Secondary | ICD-10-CM

## 2023-09-16 DIAGNOSIS — R29818 Other symptoms and signs involving the nervous system: Secondary | ICD-10-CM | POA: Diagnosis not present

## 2023-09-16 DIAGNOSIS — K219 Gastro-esophageal reflux disease without esophagitis: Secondary | ICD-10-CM | POA: Diagnosis not present

## 2023-09-16 DIAGNOSIS — Z885 Allergy status to narcotic agent status: Secondary | ICD-10-CM

## 2023-09-16 DIAGNOSIS — B9620 Unspecified Escherichia coli [E. coli] as the cause of diseases classified elsewhere: Secondary | ICD-10-CM | POA: Diagnosis present

## 2023-09-16 DIAGNOSIS — E1142 Type 2 diabetes mellitus with diabetic polyneuropathy: Secondary | ICD-10-CM | POA: Diagnosis not present

## 2023-09-16 DIAGNOSIS — E1165 Type 2 diabetes mellitus with hyperglycemia: Secondary | ICD-10-CM | POA: Diagnosis present

## 2023-09-16 DIAGNOSIS — Z7951 Long term (current) use of inhaled steroids: Secondary | ICD-10-CM

## 2023-09-16 DIAGNOSIS — H548 Legal blindness, as defined in USA: Secondary | ICD-10-CM | POA: Diagnosis not present

## 2023-09-16 DIAGNOSIS — Z87442 Personal history of urinary calculi: Secondary | ICD-10-CM

## 2023-09-16 DIAGNOSIS — G4733 Obstructive sleep apnea (adult) (pediatric): Secondary | ICD-10-CM | POA: Diagnosis not present

## 2023-09-16 DIAGNOSIS — Z79899 Other long term (current) drug therapy: Secondary | ICD-10-CM

## 2023-09-16 DIAGNOSIS — N3000 Acute cystitis without hematuria: Secondary | ICD-10-CM | POA: Diagnosis not present

## 2023-09-16 DIAGNOSIS — R4701 Aphasia: Secondary | ICD-10-CM | POA: Diagnosis present

## 2023-09-16 DIAGNOSIS — I130 Hypertensive heart and chronic kidney disease with heart failure and stage 1 through stage 4 chronic kidney disease, or unspecified chronic kidney disease: Secondary | ICD-10-CM | POA: Diagnosis not present

## 2023-09-16 DIAGNOSIS — Z9181 History of falling: Secondary | ICD-10-CM | POA: Diagnosis not present

## 2023-09-16 DIAGNOSIS — R471 Dysarthria and anarthria: Principal | ICD-10-CM | POA: Diagnosis present

## 2023-09-16 DIAGNOSIS — N3090 Cystitis, unspecified without hematuria: Secondary | ICD-10-CM

## 2023-09-16 DIAGNOSIS — I6782 Cerebral ischemia: Secondary | ICD-10-CM | POA: Diagnosis not present

## 2023-09-16 LAB — URINE DRUG SCREEN, QUALITATIVE (ARMC ONLY)
Amphetamines, Ur Screen: NOT DETECTED
Barbiturates, Ur Screen: POSITIVE — AB
Benzodiazepine, Ur Scrn: NOT DETECTED
Cannabinoid 50 Ng, Ur ~~LOC~~: NOT DETECTED
Cocaine Metabolite,Ur ~~LOC~~: NOT DETECTED
MDMA (Ecstasy)Ur Screen: NOT DETECTED
Methadone Scn, Ur: NOT DETECTED
Opiate, Ur Screen: NOT DETECTED
Phencyclidine (PCP) Ur S: NOT DETECTED
Tricyclic, Ur Screen: POSITIVE — AB

## 2023-09-16 LAB — CBC
HCT: 36.9 % (ref 36.0–46.0)
Hemoglobin: 11.3 g/dL — ABNORMAL LOW (ref 12.0–15.0)
MCH: 28 pg (ref 26.0–34.0)
MCHC: 30.6 g/dL (ref 30.0–36.0)
MCV: 91.6 fL (ref 80.0–100.0)
Platelets: 252 K/uL (ref 150–400)
RBC: 4.03 MIL/uL (ref 3.87–5.11)
RDW: 14.4 % (ref 11.5–15.5)
WBC: 5.9 K/uL (ref 4.0–10.5)
nRBC: 0 % (ref 0.0–0.2)

## 2023-09-16 LAB — CBC WITH DIFFERENTIAL/PLATELET
Abs Immature Granulocytes: 0.02 K/uL (ref 0.00–0.07)
Basophils Absolute: 0 K/uL (ref 0.0–0.1)
Basophils Relative: 1 %
Eosinophils Absolute: 0 K/uL (ref 0.0–0.5)
Eosinophils Relative: 1 %
HCT: 34.8 % — ABNORMAL LOW (ref 36.0–46.0)
Hemoglobin: 10.7 g/dL — ABNORMAL LOW (ref 12.0–15.0)
Immature Granulocytes: 0 %
Lymphocytes Relative: 30 %
Lymphs Abs: 1.9 K/uL (ref 0.7–4.0)
MCH: 28.5 pg (ref 26.0–34.0)
MCHC: 30.7 g/dL (ref 30.0–36.0)
MCV: 92.8 fL (ref 80.0–100.0)
Monocytes Absolute: 0.6 K/uL (ref 0.1–1.0)
Monocytes Relative: 9 %
Neutro Abs: 3.7 K/uL (ref 1.7–7.7)
Neutrophils Relative %: 59 %
Platelets: 244 K/uL (ref 150–400)
RBC: 3.75 MIL/uL — ABNORMAL LOW (ref 3.87–5.11)
RDW: 14.5 % (ref 11.5–15.5)
WBC: 6.2 K/uL (ref 4.0–10.5)
nRBC: 0 % (ref 0.0–0.2)

## 2023-09-16 LAB — TROPONIN I (HIGH SENSITIVITY)
Troponin I (High Sensitivity): 6 ng/L (ref <18)
Troponin I (High Sensitivity): 6 ng/L (ref <18)

## 2023-09-16 LAB — URINALYSIS, ROUTINE W REFLEX MICROSCOPIC
Bilirubin Urine: NEGATIVE
Glucose, UA: NEGATIVE mg/dL
Ketones, ur: NEGATIVE mg/dL
Nitrite: NEGATIVE
Protein, ur: 30 mg/dL — AB
Specific Gravity, Urine: 1.02 (ref 1.005–1.030)
WBC, UA: >50 WBC/hpf (ref 0–5)
pH: 5 (ref 5.0–8.0)

## 2023-09-16 LAB — COMPREHENSIVE METABOLIC PANEL WITH GFR
ALT: 18 U/L (ref 0–44)
AST: 17 U/L (ref 15–41)
Albumin: 3 g/dL — ABNORMAL LOW (ref 3.5–5.0)
Alkaline Phosphatase: 77 U/L (ref 38–126)
Anion gap: 7 (ref 5–15)
BUN: 19 mg/dL (ref 6–20)
CO2: 26 mmol/L (ref 22–32)
Calcium: 8.9 mg/dL (ref 8.9–10.3)
Chloride: 108 mmol/L (ref 98–111)
Creatinine, Ser: 0.99 mg/dL (ref 0.44–1.00)
GFR, Estimated: >60 mL/min (ref >60)
Glucose, Bld: 243 mg/dL — ABNORMAL HIGH (ref 70–99)
Potassium: 3.9 mmol/L (ref 3.5–5.1)
Sodium: 141 mmol/L (ref 135–145)
Total Bilirubin: 0.5 mg/dL (ref 0.0–1.2)
Total Protein: 6.3 g/dL — ABNORMAL LOW (ref 6.5–8.1)

## 2023-09-16 LAB — GLUCOSE, CAPILLARY: Glucose-Capillary: 151 mg/dL — ABNORMAL HIGH (ref 70–99)

## 2023-09-16 LAB — CREATININE, SERUM
Creatinine, Ser: 0.88 mg/dL (ref 0.44–1.00)
GFR, Estimated: >60 mL/min (ref >60)

## 2023-09-16 LAB — ETHANOL: Alcohol, Ethyl (B): <15 mg/dL (ref <15)

## 2023-09-16 MED ORDER — SODIUM CHLORIDE 0.9 % IV SOLN
1.0000 g | INTRAVENOUS | Status: AC
  Administered 2023-09-16: 1 g via INTRAVENOUS
  Filled 2023-09-16: qty 10

## 2023-09-16 MED ORDER — ONDANSETRON HCL 4 MG PO TABS: 4.0000 mg | ORAL_TABLET | Freq: Four times a day (QID) | ORAL | Status: DC | PRN

## 2023-09-16 MED ORDER — SODIUM CHLORIDE 0.9 % IV BOLUS
1000.0000 mL | Freq: Once | INTRAVENOUS | Status: AC
  Administered 2023-09-16: 1000 mL via INTRAVENOUS

## 2023-09-16 MED ORDER — ACETAMINOPHEN 650 MG RE SUPP: 650.0000 mg | Freq: Four times a day (QID) | RECTAL | Status: DC | PRN

## 2023-09-16 MED ORDER — ACETAMINOPHEN 325 MG PO TABS
650.0000 mg | ORAL_TABLET | Freq: Four times a day (QID) | ORAL | Status: DC | PRN
  Administered 2023-09-17: 650 mg via ORAL
  Filled 2023-09-16: qty 2

## 2023-09-16 MED ORDER — ENOXAPARIN SODIUM 60 MG/0.6ML IJ SOSY
0.5000 mg/kg | PREFILLED_SYRINGE | INTRAMUSCULAR | Status: DC
  Administered 2023-09-16 – 2023-09-17 (×2): 55 mg via SUBCUTANEOUS
  Filled 2023-09-16 (×2): qty 0.6

## 2023-09-16 MED ORDER — ONDANSETRON HCL 4 MG/2ML IJ SOLN: 4.0000 mg | Freq: Four times a day (QID) | INTRAMUSCULAR | Status: DC | PRN

## 2023-09-16 NOTE — ED Notes (Signed)
 Called CCMD to place patient on monitor

## 2023-09-16 NOTE — ED Provider Notes (Signed)
 [facility] Provider Note    Event Date/Time   First MD Initiated Contact with Patient 09/16/23 1327     (approximate)   History   Aphasia   HPI  [name] is a 58 y.o. female with a history of multiple prior CVA with left sided hemiparesis, CAD, CHF, type 2 diabetes, who presents with slurred speech, noted this morning.  Per EMS, the family last spoke to the patient last night around 11 PM and she was asymptomatic at that time.  That was her last known well.  The slurred her garbled speech appeared sometime this morning.  The patient noted that her speech was abnormal.  However she denies any difficulty finding words.  She reports generalized weakness and fatigue, however she denies any new or worsening focal weakness.  She has no chest pain or difficulty breathing.  She has no vomiting or diarrhea.  I reviewed the past medical records.  The patient was admitted to the hospitalist service last month with an episode of near syncope.  Syncope workup was unremarkable for acute findings.   Physical Exam   Triage Vital Signs: ED Triage Vitals  Encounter Vitals Group     BP 09/16/23 1329 108/64     Girls Systolic BP Percentile --      Girls Diastolic BP Percentile --      Boys Systolic BP Percentile --      Boys Diastolic BP Percentile --      Pulse Rate 09/16/23 1329 70     Resp 09/16/23 1329 18     Temp 09/16/23 1329 97.8 F (36.6 C)     Temp Source 09/16/23 1329 Oral     SpO2 09/16/23 1329 99 %     Weight 09/16/23 1337 241 lb (109.3 kg)     Height 09/16/23 1337 5' 6 (1.676 m)     Head Circumference --      Peak Flow --      Pain Score --      Pain Loc --      Pain Education --      Exclude from Growth Chart --     Most recent vital signs: Vitals:   09/16/23 1329 09/16/23 1400  BP: 108/64 119/82  Pulse: 70 70  Resp: 18 18  Temp: 97.8 F (36.6 C)   SpO2: 99% 97%     General: Alert, oriented, weak appearing, no  distress. CV:  Good peripheral perfusion.  Resp:  Normal effort.  Abd:  No distention.  Other:  Left-sided weakness, chronic.  EOMI.  Right pupil reactive.  No facial droop.  Somewhat dysarthric speech.  No aphasia.   ED Results / Procedures / Treatments   Labs (all labs ordered are listed, but only abnormal results are displayed) Labs Reviewed  COMPREHENSIVE METABOLIC PANEL WITH GFR - Abnormal; Notable for the following components:      Result Value   Glucose, Bld 243 (*)    Total Protein 6.3 (*)    Albumin  3.0 (*)    All other components within normal limits  CBC WITH DIFFERENTIAL/PLATELET - Abnormal; Notable for the following components:   RBC 3.75 (*)    Hemoglobin 10.7 (*)    HCT 34.8 (*)    All other components within normal limits  URINALYSIS, ROUTINE W REFLEX MICROSCOPIC  TROPONIN I (HIGH SENSITIVITY)  TROPONIN I (HIGH SENSITIVITY)     EKG  ED ECG REPORT I, Waylon Cassis, the attending physician, personally viewed  and interpreted this ECG.  Date: 09/16/2023 EKG Time: 1338 Rate: 71 Rhythm: normal sinus rhythm QRS Axis: normal Intervals: normal ST/T Wave abnormalities: normal Narrative Interpretation: no evidence of acute ischemia    RADIOLOGY  CT head: I independently viewed and interpreted the images; there is no ICH.  Radiology report indicates the following:  IMPRESSION:  1. No acute intracranial abnormality.  2. Mild cerebral atrophy and chronic small vessel ischemic disease.    PROCEDURES:  Critical Care performed: No  Procedures   MEDICATIONS ORDERED IN ED: Medications - No data to display   IMPRESSION / MDM / ASSESSMENT AND PLAN / ED COURSE  I reviewed the triage vital signs and the nursing notes.  58 year old female with PMH as noted above including prior CVA with left-sided hemiparesis presents with dysarthria, with a last known well of 11 PM last night.  The patient has chronic left-sided weakness on exam as well as left-sided  blindness.  She does appear somewhat dysarthric.  There are no other acute neurologic findings.  Vital signs are normal.  Differential diagnosis includes, but is not limited to, CVA, TIA, electrolyte abnormality or other metabolic cause, hypoglycemia.  We will obtain CT head  Patient's presentation is most consistent with acute presentation with potential threat to life or bodily function.  The patient is on the cardiac monitor to evaluate for evidence of arrhythmia and/or significant heart rate changes.  ----------------------------------------- 3:29 PM on 09/16/2023 -----------------------------------------  CT head is negative.  CMP and CBC show no acute findings.  Troponin is negative.  Urinalysis is still pending.  I consulted and discussed case with Dr. Lindzen from neurology who recommends an MRI and MRA for further evaluation.  I have signed the patient out to the oncoming ED physician Dr. Viviann.   FINAL CLINICAL IMPRESSION(S) / ED DIAGNOSES   Final diagnoses:  Dysarthria  Generalized weakness     Rx / DC Orders   ED Discharge Orders     None        Note:  This document was prepared using Dragon voice recognition software and may include unintentional dictation errors.    Karen Pae, MD 09/16/23 1530

## 2023-09-16 NOTE — ED Provider Notes (Signed)
 Procedures     ----------------------------------------- 8:17 PM on 09/16/2023 ----------------------------------------- Patient has continued dysarthria after MRI/MRA results obtained which are negative for signs of stroke.  Does have dry oral mucosa.  Was given 1 L bolus without improvement.  Urinalysis does show UTI.  With the acute infection, comorbidities including obesity and diabetes, and continued altered mental status, case was discussed with hospitalist for further evaluation and management.     Viviann Pastor, MD 09/16/23 2018

## 2023-09-16 NOTE — H&P (Signed)
 History and Physical    Patient: [name] FMW:[id number] DOB: 1965-02-28 DOA: 09/16/2023 DOS: the patient was seen and examined on 09/17/2023 PCP: [name], [name]  Patient coming from: Home  Chief Complaint:  Chief Complaint  Patient presents with   Aphasia   HPI: [name] is a 58 y.o. female with medical history significant of CAD, GERD, HFpEF, previous abdominal surgery, previous SBO, obesity, T2DM who presents to the emergency department due to from home via EMS due to noted slurred speech this morning.  Last well-known was around 11 PM last night and she was asymptomatic.  This morning, patient states that she felt so weak and sleepy and did not want to get out of bed.  Daughter noted with slurred speech without any new or focal weakness.  Since patient has had prior similar presentation when she had a UTI, it was suspected that she may have had a bladder infection.  EMS was activated and patient was sent to the ED for further evaluation.  She denied fever, chest pain, shortness of breath, nausea, vomiting.  ED Course:  In the emergency department, she was hemodynamically stable.  Workup in the ED showed normocytic anemia, BMP was normal except for blood glucose of 243, albumin  3.0.  Urinalysis was positive for UTI, urine drug screen was positive for TCA and barbiturates. CT head and MRI 8 without contrast showed no acute intracranial abnormality MRA head without contrast showed no large vessel occlusion  She was treated with IV ceftriaxone .  IV hydration was provided.  TRH was asked to admit patient  Review of Systems: Review of systems as noted in the HPI. All other systems reviewed and are negative.   Past Medical History:  Diagnosis Date   Anxiety and depression 09/13/2006   Qualifier: Diagnosis of  By: Gladis [name], Nykedtra     Arthritis    joint pain    Asthma    CHF (congestive heart failure) (HCC)    COPD (chronic obstructive pulmonary disease)  (HCC)    chronic bronchitis    Depression    Diabetes mellitus    since age 48; type 2 IDDM   Diabetic neuropathy, painful (HCC)    FEET AND HANDS   Diabetic retinopathy (HCC)    Diastolic dysfunction    a.  Echo 11/17: EF 60-65%, mild LVH, no RWMA, Gr1DD, mild AI, dilated aortic root measuring 38 mm, mildly dilated ascending aorta   Dilated aortic root (HCC)    38mm by echo 11/2015   Dizziness    secondary to diabetes and hypertension    Epilepsy idiopathic petit mal (HCC) 09/13/2006   last seizure 2012;controlled w/ topomax   GERD (gastroesophageal reflux disease)    Glaucoma    NOT ON ANY EYE DROPS    Headache(784.0)    migraines    Heart murmur    born with    History of stress test    a. 11/17: Normal perfusion, EF 53%, normal study   Hx of blood clots    hematomas removed from left side of brain from 11mos to 58yrs old    Hyperlipidemia    Hypertension    Hypertensive urgency 04/23/2021   Legally blind    left eye    MIGRAINE HEADACHE 09/14/2006   Qualifier: Diagnosis of  By: Gladis [name], Nykedtra     Myocardial infarction [facility])    a.  Patient reported history of without objective documentation   Nephrolithiasis    frequent urination ,  urination at nite  PT SEEN IN ER 09/22/11 FOR BACK AND RT SIDED PAIN--HAS KNOWN STONE RT URETER AND UA IN ER SHOWED UTI   Pain 09/23/2011   LOWER BACK AND RIGHT SIDE--PT HAS RIGHT URETERAL STONE   Pneumonia    hx of 2009   Pyelonephritis    SBO (small bowel obstruction) (HCC) 04/23/2021   Seizures (HCC)    Sleep apnea    [facility];does not use Cpap ; mild   Small bowel obstruction (HCC) 05/18/2021   Stress incontinence    Stroke (HCC)    last 2003  RESIDUAL LEFT LEG WEAKNESS--NO OTHER RESIDUAL PROBLEMS   Syncope 07/07/2021   Past Surgical History:  Procedure Laterality Date   BRAIN HEMATOMA EVACUATION     five procedures total, first procedure when 64 months old, last at 58 years of age.   CYSTOSCOPY W/  RETROGRADES  09/24/2011   Procedure: CYSTOSCOPY WITH RETROGRADE PYELOGRAM;  Surgeon: Norleen JINNY Seltzer, MD;  Location: [facility];  Service: Urology;  Laterality: Right;   CYSTOSCOPY WITH URETEROSCOPY  09/24/2011   Procedure: CYSTOSCOPY WITH URETEROSCOPY;  Surgeon: Norleen JINNY Seltzer, MD;  Location: [facility];  Service: Urology;  Laterality: Right;  Balloon dilation right ureter    CYSTOSCOPY/RETROGRADE/URETEROSCOPY  08/24/2011   Procedure: CYSTOSCOPY/RETROGRADE/URETEROSCOPY;  Surgeon: Toribio Neysa Repine, MD;  Location: [facility];  Service: Urology;  Laterality: Right;  Cysto, Right retrograde Pyelogram, right stent placement.    INCISIONAL HERNIA REPAIR  05/05/2011   Procedure: LAPAROSCOPIC INCISIONAL HERNIA;  Surgeon: Donnice Bury, MD;  Location: [facility] OR;  Service: General;  Laterality: N/A;   kidney stone removal     LAPAROTOMY N/A 06/12/2021   Procedure: EXPLORATORY LAPAROTOMY, LYSIS OF ADHESIONS;  Surgeon: Desiderio Schanz, MD;  Location: [facility] ORS;  Service: General;  Laterality: N/A;   MASS EXCISION  05/05/2011   Procedure: EXCISION MASS;  Surgeon: Donnice Bury, MD;  Location: [facility] OR;  Service: General;  Laterality: Right;   PCNL     RETINAL DETACHMENT SURGERY Left 1990   RIGHT/LEFT HEART CATH AND CORONARY ANGIOGRAPHY N/A 04/28/2019   Procedure: RIGHT/LEFT HEART CATH AND CORONARY ANGIOGRAPHY;  Surgeon: Florencio Cara BIRCH, MD;  Location: [facility] INVASIVE CV LAB;  Service: Cardiovascular;  Laterality: N/A;   SHUNT REMOVAL     shunt inserted at age 86 removed at age 68    VAGINAL HYSTERECTOMY  27    Social History:  reports that she quit smoking about 36 years ago. Her smoking use included cigarettes. She started smoking about 51 years ago. She has a 15 pack-year smoking history. She has never used smokeless tobacco. She reports that she does not currently use alcohol  after a past usage of about 1.0 standard drink of alcohol  per week. She reports that she does not use drugs.   Allergies  Allergen Reactions    Codeine Swelling and Other (See Comments)    Swelling and burning of mouth (inside)   Farxiga  [Dapagliflozin ] Other (See Comments)    Recurrent UTI with farxiga    Lactose Intolerance (Gi) Nausea And Vomiting    Family History  Problem Relation Age of Onset   Uterine cancer Mother    Hypertension Mother    Cancer Mother    Brain cancer Maternal Grandmother    Hypertension Maternal Grandmother    Birth defects Daughter    Hypertension Daughter    Cirrhosis Maternal Grandfather    ADD / ADHD Maternal Grandfather    Birth defects Maternal Grandfather  Diabetes Maternal Grandfather    Anesthesia problems Neg Hx    Colon cancer Neg Hx    Esophageal cancer Neg Hx    Pancreatic cancer Neg Hx      Prior to Admission medications   Medication Sig Start Date End Date Taking? Authorizing Provider  ACCU-CHEK GUIDE TEST test strip USE 1 STRIP IN THE MORNING, AT  NOON, AND AT BEDTIME 03/31/23   Dartha Ernst, MD  albuterol  (ACCUNEB ) 1.25 MG/3ML nebulizer solution USE 1 VIAL VIA NEBULIZER TWICE  DAILY 05/12/23   [name], [name], [name]  albuterol  (VENTOLIN  HFA) 108 (90 Base) MCG/ACT inhaler USE 1 TO 2 INHALATIONS BY MOUTH  EVERY 6 HOURS AS NEEDED 08/23/23   [name], [name], [name]  amitriptyline  (ELAVIL ) 25 MG tablet Take 2-3 tablets (50-75 mg total) by mouth at bedtime. 06/30/23 12/27/23  [name], [name], NP  amLODipine  (NORVASC ) 10 MG tablet TAKE 1 TABLET BY MOUTH DAILY 10/01/22   [name], [name], [name]  buprenorphine  (BUTRANS ) 7.5 MCG/HR Place 1 patch onto the skin once a week. 07/30/23 10/22/23  [name], MD  butalbital -acetaminophen -caffeine  (FIORICET ) 50-325-40 MG tablet Take 1-2 tablets by mouth every 6 (six) hours as needed for headache. 04/26/23 04/25/24  Marcelino Nurse, MD  carvedilol  (COREG ) 25 MG tablet Take 25 mg by mouth 2 (two) times daily. 03/06/21   [provider]  cholecalciferol  (VITAMIN D3) 25 MCG (1000 UNIT) tablet Take 1 tablet (1,000 Units total) by mouth daily. 11/02/22   [name], [name],  [name]  clopidogrel  (PLAVIX ) 75 MG tablet Take 1 tablet (75 mg total) by mouth daily. 10/10/19   [name], MD  Continuous Glucose Sensor (FREESTYLE LIBRE 3 PLUS SENSOR) MISC Inject 1 Device into the skin continuous. Change every 14 days 01/14/23   Dartha Ernst, MD  Continuous Glucose Sensor (FREESTYLE LIBRE 3 SENSOR) MISC 1 Device by Does not apply route continuous. 09/25/22   [name], [name], MD  cycloSPORINE  (RESTASIS ) 0.05 % ophthalmic emulsion Place 2 drops into both eyes 2 (two) times daily.    [provider]  Dulaglutide  (TRULICITY ) 4.5 MG/0.5ML SOAJ Inject 4.5 mg as directed once a week. 04/22/23   [name], [name], MD  DULoxetine  (CYMBALTA ) 60 MG capsule TAKE 1 CAPSULE BY MOUTH EVERY DAY 10/26/22   [name], [name], [name]  EPINEPHrine  0.3 mg/0.3 mL IJ SOAJ injection INJECT 1 PEN INTRAMUSCULARLY AS  NEEDED FOR ALLERGIC RESPONSE AS  DIRECTED BY MD. [facility]  HELP AFTER USE 05/21/23   [name], [name], [name]  ezetimibe  (ZETIA ) 10 MG tablet TAKE 1 TABLET BY MOUTH DAILY 05/26/23   [name], [name], [name]  furosemide  (LASIX ) 20 MG tablet TAKE 1 TABLET BY MOUTH DAILY 10/01/22   [name], [name], [name]  gabapentin  (NEURONTIN ) 600 MG tablet 600 mg qAM, 600 mg qPM, 1200 mg QHS 07/12/23   [name], [name], NP  Glucagon  (GVOKE HYPOPEN  2-PACK) 1 MG/0.2ML SOAJ INJECT SUB-Q 1MG  ONCE AS NEEDED  FOR UP TO 1 DOSE. ONLY FOR  SEVERE LOW BLOOD SUGAR NOT  RESPONDING TO DRINKING  JUICE/GLUCOSE TABS, WHEN CBG &lt;70 05/18/23   Dartha Ernst, MD  hydrALAZINE  (APRESOLINE ) 50 MG tablet Take 1 tablet by mouth 3 (three) times daily. 05/22/22   [provider]  insulin  glargine, 2 Unit Dial , (TOUJEO  MAX SOLOSTAR) 300 UNIT/ML Solostar Pen Inject 50 Units into the skin at bedtime. Plus sliding scale, max dose 80 units a day 06/15/23 09/13/23  Dartha Ernst, MD  insulin  lispro (HUMALOG  KWIKPEN) 100 UNIT/ML KwikPen Inject 15 Units into the skin 3 (  three) times daily. Plus Sliding scale, max dose 80 units a day 06/15/23 09/13/23   [name], [name], MD  Insulin  Pen Needle (PEN NEEDLES) 32G X 4 MM MISC 1 Device by Does not apply route in the morning, at noon, in the evening, and at bedtime. 04/19/23   [name], [name], MD  levETIRAcetam  (KEPPRA ) 500 MG tablet Take 500-750 mg by mouth See admin instructions. Take 1 tablet (500mg ) by mouth every morning and take 1 tablets by mouth (750mg ) by mouth every night    [provider]  meclizine  (ANTIVERT ) 25 MG tablet Take 1 tablet (25 mg total) by mouth 3 (three) times daily as needed for dizziness. 06/16/22   [name], MD  mirabegron  ER (MYRBETRIQ ) 50 MG TB24 tablet TAKE 1 TABLET BY MOUTH DAILY 05/26/23   [name], [name], [name]  nitroGLYCERIN  (NITROSTAT ) 0.4 MG SL tablet Place 0.4 mg under the tongue every 5 (five) minutes as needed for chest pain. 06/10/23   [provider]  pantoprazole  (PROTONIX ) 40 MG tablet TAKE 1 TABLET BY MOUTH DAILY AS  NEEDED 07/30/23   [name], [name], [name]  rosuvastatin  (CRESTOR ) 40 MG tablet TAKE 1 TABLET BY MOUTH DAILY 08/17/23   [name], [name], MD  tiZANidine  (ZANAFLEX ) 4 MG tablet Take 1 tablet (4 mg total) by mouth every 8 (eight) hours as needed for muscle spasms. 02/18/23   Lateef, Bilal, MD  Ubrogepant  (UBRELVY ) 50 MG TABS 50 mg or 100 mg taken orally with or without food. If needed, a second dose may be taken at least 2 hours after the initial dose. The maximum dose in a 24-hour period is 200 mg 02/18/23   Marcelino Nurse, MD  WIXELA INHUB 250-50 MCG/DOSE AEPB Inhale 1 puff into the lungs 2 (two) times daily. 01/22/20   [provider]    Physical Exam: BP (!) 167/92 (BP Location: Left Arm) Comment: RN [name] is aware  Pulse 70   Temp 98.2 F (36.8 C)   Resp 20   Ht 5' 6 (1.676 m)   Wt 109.3 kg   LMP  (LMP Unknown)   SpO2 100%   BMI 38.90 kg/m   General: 58 y.o. year-old female well developed well nourished in no acute distress.  Alert and oriented x3. HEENT: NCAT, EOMI Neck: Supple, trachea medial Cardiovascular: Regular rate  and rhythm with no rubs or gallops.  No thyromegaly or JVD noted.  No lower extremity edema. 2/4 pulses in all 4 extremities. Respiratory: Clear to auscultation with no wheezes or rales. Good inspiratory effort. Abdomen: Soft, nontender nondistended with normal bowel sounds x4 quadrants. Muskuloskeletal: No cyanosis, clubbing or edema noted bilaterally Neuro: CN II-XII intact, strength 5/5 in RUE and RLE, 4/5 in LUE and LLE.  Sensation, reflexes intact Skin: No ulcerative lesions noted or rashes Psychiatry: Judgement and insight appear normal. Mood is appropriate for condition and setting          Labs on Admission:  Basic Metabolic Panel: Recent Labs  Lab 09/16/23 1331 09/16/23 2150  NA 141  --   K 3.9  --   CL 108  --   CO2 26  --   GLUCOSE 243*  --   BUN 19  --   CREATININE 0.99 0.88  CALCIUM  8.9  --    Liver Function Tests: Recent Labs  Lab 09/16/23 1331  AST 17  ALT 18  ALKPHOS 77  BILITOT 0.5  PROT 6.3*  ALBUMIN  3.0*   No results for input(s): LIPASE, AMYLASE in the last  168 hours. No results for input(s): AMMONIA in the last 168 hours. CBC: Recent Labs  Lab 09/16/23 1331 09/16/23 2150  WBC 6.2 5.9  NEUTROABS 3.7  --   HGB 10.7* 11.3*  HCT 34.8* 36.9  MCV 92.8 91.6  PLT 244 252   Cardiac Enzymes: No results for input(s): CKTOTAL, CKMB, CKMBINDEX, TROPONINI in the last 168 hours.  BNP (last 3 results) Recent Labs    07/26/23 0430  BNP 18.4    ProBNP (last 3 results) No results for input(s): PROBNP in the last 8760 hours.  CBG: Recent Labs  Lab 09/16/23 2100  GLUCAP 151*    Radiological Exams on Admission: MR BRAIN WO CONTRAST Result Date: 09/16/2023 CLINICAL DATA:  Neuro deficit, acute, stroke suspected EXAM: MRI HEAD WITHOUT CONTRAST MRA HEAD WITHOUT CONTRAST TECHNIQUE: Multiplanar, multi-echo pulse sequences of the brain and surrounding structures were acquired without intravenous contrast. Angiographic images of the  Circle of Willis were acquired using MRA technique without intravenous contrast. COMPARISON:  None Available. FINDINGS: MRI HEAD FINDINGS Brain: No acute infarction, hemorrhage, hydrocephalus, extra-axial collection or mass lesion. Vascular: See below. Skull and upper cervical spine: Normal marrow signal. Sinuses/Orbits: Clear sinuses.  No acute orbital findings. Other: No mastoid effusions. MRA HEAD FINDINGS Anterior circulation: Bilateral intracranial ICAs, MCAs, and ACAs are patent without proximal hemodynamically significant stenosis. Posterior circulation: Bilateral intradural vertebral arteries, basilar artery and bilateral posterior cerebral arteries are patent without proximal hemodynamically significant stenosis. IMPRESSION: 1. No evidence of acute intracranial abnormality. 2. No large vessel occlusion or proximal hemodynamically significant stenosis. Electronically Signed   By: Gilmore GORMAN Molt M.D.   On: 09/16/2023 16:53   MR ANGIO HEAD WO CONTRAST Result Date: 09/16/2023 CLINICAL DATA:  Neuro deficit, acute, stroke suspected EXAM: MRI HEAD WITHOUT CONTRAST MRA HEAD WITHOUT CONTRAST TECHNIQUE: Multiplanar, multi-echo pulse sequences of the brain and surrounding structures were acquired without intravenous contrast. Angiographic images of the Circle of Willis were acquired using MRA technique without intravenous contrast. COMPARISON:  None Available. FINDINGS: MRI HEAD FINDINGS Brain: No acute infarction, hemorrhage, hydrocephalus, extra-axial collection or mass lesion. Vascular: See below. Skull and upper cervical spine: Normal marrow signal. Sinuses/Orbits: Clear sinuses.  No acute orbital findings. Other: No mastoid effusions. MRA HEAD FINDINGS Anterior circulation: Bilateral intracranial ICAs, MCAs, and ACAs are patent without proximal hemodynamically significant stenosis. Posterior circulation: Bilateral intradural vertebral arteries, basilar artery and bilateral posterior cerebral arteries are  patent without proximal hemodynamically significant stenosis. IMPRESSION: 1. No evidence of acute intracranial abnormality. 2. No large vessel occlusion or proximal hemodynamically significant stenosis. Electronically Signed   By: Gilmore GORMAN Molt M.D.   On: 09/16/2023 16:53   CT Head Wo Contrast Result Date: 09/16/2023 EXAM: CT HEAD WITHOUT CONTRAST 09/16/2023 03:14:28 PM TECHNIQUE: CT of the head was performed without the administration of intravenous contrast. Automated exposure control, iterative reconstruction, and/or weight based adjustment of the mA/kV was utilized to reduce the radiation dose to as low as reasonably achievable. COMPARISON: Head CT 07/25/2023 and MRI 06/10/2022. CLINICAL HISTORY: Neuro deficit, acute, stroke suspected. Per EMS patient last know well at 2300 on 09/15/23; at 1030 today family noticed slurred speech. 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 Patient is on Plavix . FINDINGS: BRAIN AND VENTRICLES: No acute hemorrhage. Gray-white differentiation is preserved. No hydrocephalus. No extra-axial collection. No mass effect or midline shift. Mild cerebral atrophy. Mild cerebral white matter hypodensities, similar to the prior CT and nonspecific but compatible with chronic small vessel ischemic disease. Prominent dural calcifications.  ORBITS: No acute abnormality. SINUSES: No acute abnormality. SOFT TISSUES AND SKULL: No acute soft tissue abnormality. No skull fracture. IMPRESSION: 1. No acute intracranial abnormality. 2. Mild cerebral atrophy and chronic small vessel ischemic disease. Electronically signed by: Dasie Hamburg MD 09/16/2023 03:23 PM EDT RP Workstation: [id number]    EKG: I independently viewed the EKG done and my findings are as followed: EKG showed normal sinus rhythm at rate of 71 bpm  Assessment/Plan Present on Admission:  UTI (urinary tract infection)  Type 2 diabetes mellitus with hyperglycemia (HCC)  Essential hypertension, benign  Mixed  hyperlipidemia  Obesity, Class II, BMI 35-39.9  Chronic heart failure with preserved ejection fraction (HFpEF) (HCC)  Active Problems:   Essential hypertension, benign   Chronic heart failure with preserved ejection fraction (HFpEF) (HCC)   Mixed hyperlipidemia   Obesity, Class II, BMI 35-39.9   History of CVA (cerebrovascular accident)   Type 2 diabetes mellitus with hyperglycemia (HCC)   UTI (urinary tract infection)   Hypoalbuminemia due to protein-calorie malnutrition (HCC)  UTI POA Patient was started on IV ceftriaxone , we shall continue with same at this time Urinalysis was positive for UTI, urine culture pending  Type 2 diabetes mellitus with hyperglycemia Hemoglobin A1c is 9.9 on 08/27/2023 Continue Semglee  10 units nightly and adjust dose accordingly Continue ISS and hypoglycemia protocol  Hypoalbuminemia possibly due to moderate protein calorie malnutrition Albumin  3.0, protein supplement will be provided  History of CVA Continue Plavix , statin  Essential hypertension Continue amlodipine , Coreg , hydralazine , Lasix   Mixed hyperlipidemia Continue Zetia , Crestor   Obesity class II (BMI 38.90) Diet and lifestyle modification  Chronic HFpEF Continue total input/output, daily weights and fluid restriction Continue IV Lasix  20 mg p.o. daily Continue heart healthy/modified carb diet  Echocardiogram done on 07/26/2023 showed LVEF of 55 to 60%.  No RWMA.  Moderate LVH.  G1 DD  DVT prophylaxis: Lovenox   Code Status: Full code  Family Communication: None at bedside  Consults: None  Severity of Illness: The appropriate patient status for this patient is INPATIENT. Inpatient status is judged to be reasonable and necessary in order to provide the required intensity of service to ensure the patient's safety. The patient's presenting symptoms, physical exam findings, and initial radiographic and laboratory data in the context of their chronic comorbidities is felt to place  them at high risk for further clinical deterioration. Furthermore, it is not anticipated that the patient will be medically stable for discharge from the hospital within 2 midnights of admission.   * I certify that at the point of admission it is my clinical judgment that the patient will require inpatient hospital care spanning beyond 2 midnights from the point of admission due to high intensity of service, high risk for further deterioration and high frequency of surveillance required.*  Author: Posey Maier, DO 09/17/2023 12:35 AM  For on call review [email or web address].

## 2023-09-16 NOTE — ED Notes (Signed)
Patient quiet and sleeping.

## 2023-09-16 NOTE — ED Triage Notes (Signed)
 Per EMS patient last know well at 2300 on 09/15/23; at 1030 today family noticed slurred speech. 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 Patient is on Plavix .   BP 148/89 CBG 246 O2 98% RA HR 71 NSR  20G LAC

## 2023-09-16 NOTE — ED Notes (Signed)
 Fall risk bundle applied

## 2023-09-16 NOTE — ED Notes (Signed)
 Patient to MRI.

## 2023-09-17 DIAGNOSIS — E46 Unspecified protein-calorie malnutrition: Secondary | ICD-10-CM | POA: Insufficient documentation

## 2023-09-17 DIAGNOSIS — R2990 Unspecified symptoms and signs involving the nervous system: Secondary | ICD-10-CM

## 2023-09-17 LAB — COMPREHENSIVE METABOLIC PANEL WITH GFR
ALT: 17 U/L (ref 0–44)
AST: 18 U/L (ref 15–41)
Albumin: 3.1 g/dL — ABNORMAL LOW (ref 3.5–5.0)
Alkaline Phosphatase: 79 U/L (ref 38–126)
Anion gap: 7 (ref 5–15)
BUN: 16 mg/dL (ref 6–20)
CO2: 26 mmol/L (ref 22–32)
Calcium: 8.7 mg/dL — ABNORMAL LOW (ref 8.9–10.3)
Chloride: 107 mmol/L (ref 98–111)
Creatinine, Ser: 0.83 mg/dL (ref 0.44–1.00)
GFR, Estimated: >60 mL/min (ref >60)
Glucose, Bld: 182 mg/dL — ABNORMAL HIGH (ref 70–99)
Potassium: 3.8 mmol/L (ref 3.5–5.1)
Sodium: 140 mmol/L (ref 135–145)
Total Bilirubin: 0.3 mg/dL (ref 0.0–1.2)
Total Protein: 6.9 g/dL (ref 6.5–8.1)

## 2023-09-17 LAB — GLUCOSE, CAPILLARY
Glucose-Capillary: 168 mg/dL — ABNORMAL HIGH (ref 70–99)
Glucose-Capillary: 213 mg/dL — ABNORMAL HIGH (ref 70–99)
Glucose-Capillary: 247 mg/dL — ABNORMAL HIGH (ref 70–99)

## 2023-09-17 LAB — PHOSPHORUS: Phosphorus: 2.8 mg/dL (ref 2.5–4.6)

## 2023-09-17 LAB — CBC
HCT: 35.9 % — ABNORMAL LOW (ref 36.0–46.0)
Hemoglobin: 11 g/dL — ABNORMAL LOW (ref 12.0–15.0)
MCH: 28.1 pg (ref 26.0–34.0)
MCHC: 30.6 g/dL (ref 30.0–36.0)
MCV: 91.8 fL (ref 80.0–100.0)
Platelets: 269 K/uL (ref 150–400)
RBC: 3.91 MIL/uL (ref 3.87–5.11)
RDW: 14.4 % (ref 11.5–15.5)
WBC: 6.1 K/uL (ref 4.0–10.5)
nRBC: 0 % (ref 0.0–0.2)

## 2023-09-17 LAB — MAGNESIUM: Magnesium: 2.1 mg/dL (ref 1.7–2.4)

## 2023-09-17 MED ORDER — CARVEDILOL 25 MG PO TABS
25.0000 mg | ORAL_TABLET | Freq: Two times a day (BID) | ORAL | Status: DC
  Administered 2023-09-17 – 2023-09-18 (×3): 25 mg via ORAL
  Filled 2023-09-17 (×3): qty 1

## 2023-09-17 MED ORDER — AMLODIPINE BESYLATE 10 MG PO TABS
10.0000 mg | ORAL_TABLET | Freq: Every day | ORAL | Status: DC
  Administered 2023-09-17 – 2023-09-18 (×2): 10 mg via ORAL
  Filled 2023-09-17 (×2): qty 1

## 2023-09-17 MED ORDER — EZETIMIBE 10 MG PO TABS
10.0000 mg | ORAL_TABLET | Freq: Every day | ORAL | Status: DC
  Administered 2023-09-17 – 2023-09-18 (×2): 10 mg via ORAL
  Filled 2023-09-17 (×2): qty 1

## 2023-09-17 MED ORDER — AMITRIPTYLINE HCL 50 MG PO TABS
50.0000 mg | ORAL_TABLET | Freq: Every day | ORAL | Status: DC
  Administered 2023-09-17: 75 mg via ORAL
  Filled 2023-09-17: qty 1.5

## 2023-09-17 MED ORDER — CLOPIDOGREL BISULFATE 75 MG PO TABS
75.0000 mg | ORAL_TABLET | Freq: Every day | ORAL | Status: DC
  Administered 2023-09-17 – 2023-09-18 (×2): 75 mg via ORAL
  Filled 2023-09-17 (×2): qty 1

## 2023-09-17 MED ORDER — LEVETIRACETAM 500 MG PO TABS
500.0000 mg | ORAL_TABLET | Freq: Every day | ORAL | Status: DC
  Administered 2023-09-17 – 2023-09-18 (×2): 500 mg via ORAL
  Filled 2023-09-17 (×2): qty 1

## 2023-09-17 MED ORDER — HYDRALAZINE HCL 50 MG PO TABS
50.0000 mg | ORAL_TABLET | Freq: Three times a day (TID) | ORAL | Status: DC
  Administered 2023-09-17 – 2023-09-18 (×4): 50 mg via ORAL
  Filled 2023-09-17 (×4): qty 1

## 2023-09-17 MED ORDER — FLUTICASONE FUROATE-VILANTEROL 200-25 MCG/ACT IN AEPB
1.0000 | INHALATION_SPRAY | Freq: Every day | RESPIRATORY_TRACT | Status: DC
  Administered 2023-09-17 – 2023-09-18 (×2): 1 via RESPIRATORY_TRACT
  Filled 2023-09-17: qty 28

## 2023-09-17 MED ORDER — INSULIN ASPART 100 UNIT/ML IJ SOLN
0.0000 [IU] | Freq: Three times a day (TID) | INTRAMUSCULAR | Status: DC
  Administered 2023-09-17: 3 [IU] via SUBCUTANEOUS
  Administered 2023-09-17 – 2023-09-18 (×3): 5 [IU] via SUBCUTANEOUS
  Filled 2023-09-17 (×4): qty 1

## 2023-09-17 MED ORDER — FUROSEMIDE 20 MG PO TABS
20.0000 mg | ORAL_TABLET | Freq: Every day | ORAL | Status: DC
  Administered 2023-09-17 – 2023-09-18 (×2): 20 mg via ORAL
  Filled 2023-09-17 (×2): qty 1

## 2023-09-17 MED ORDER — INSULIN ASPART 100 UNIT/ML IJ SOLN
0.0000 [IU] | Freq: Every day | INTRAMUSCULAR | Status: DC
  Administered 2023-09-17: 2 [IU] via SUBCUTANEOUS
  Filled 2023-09-17: qty 1

## 2023-09-17 MED ORDER — LEVETIRACETAM 750 MG PO TABS
750.0000 mg | ORAL_TABLET | Freq: Every day | ORAL | Status: DC
  Administered 2023-09-17: 750 mg via ORAL
  Filled 2023-09-17: qty 1

## 2023-09-17 MED ORDER — INSULIN GLARGINE 100 UNIT/ML ~~LOC~~ SOLN
10.0000 [IU] | Freq: Two times a day (BID) | SUBCUTANEOUS | Status: DC
  Administered 2023-09-17 – 2023-09-18 (×3): 10 [IU] via SUBCUTANEOUS
  Filled 2023-09-17 (×4): qty 0.1

## 2023-09-17 MED ORDER — SODIUM CHLORIDE 0.9 % IV SOLN
1.0000 g | INTRAVENOUS | Status: DC
  Administered 2023-09-17: 1 g via INTRAVENOUS
  Filled 2023-09-17: qty 10

## 2023-09-17 MED ORDER — SODIUM CHLORIDE 0.9 % IV SOLN
1.0000 g | INTRAVENOUS | Status: DC
  Filled 2023-09-17: qty 10

## 2023-09-17 MED ORDER — ROSUVASTATIN CALCIUM 10 MG PO TABS
40.0000 mg | ORAL_TABLET | Freq: Every day | ORAL | Status: DC
  Administered 2023-09-17 – 2023-09-18 (×2): 40 mg via ORAL
  Filled 2023-09-17 (×2): qty 4

## 2023-09-17 MED ORDER — DULOXETINE HCL 30 MG PO CPEP
60.0000 mg | ORAL_CAPSULE | Freq: Every day | ORAL | Status: DC
  Administered 2023-09-17 – 2023-09-18 (×2): 60 mg via ORAL
  Filled 2023-09-17 (×2): qty 2

## 2023-09-17 MED ORDER — GLUCERNA SHAKE PO LIQD
237.0000 mL | Freq: Three times a day (TID) | ORAL | Status: DC
  Administered 2023-09-17 – 2023-09-18 (×3): 237 mL via ORAL

## 2023-09-17 MED ORDER — INSULIN GLARGINE 100 UNIT/ML ~~LOC~~ SOLN: 10.0000 [IU] | Freq: Every day | SUBCUTANEOUS | Status: DC

## 2023-09-17 NOTE — Plan of Care (Signed)
  Problem: Health Behavior/Discharge Planning: Goal: Ability to manage health-related needs will improve Outcome: Progressing   Problem: Clinical Measurements: Goal: Ability to maintain clinical measurements within normal limits will improve Outcome: Progressing Goal: Will remain free from infection Outcome: Progressing Goal: Diagnostic test results will improve Outcome: Progressing Goal: Respiratory complications will improve Outcome: Progressing   Problem: Activity: Goal: Risk for activity intolerance will decrease Outcome: Progressing   Problem: Nutrition: Goal: Adequate nutrition will be maintained Outcome: Progressing   

## 2023-09-17 NOTE — TOC CM/SW Note (Signed)
 Transition of Care [facility]) - Inpatient Brief Assessment   Patient Details  Name: [name] MRN: [id number] Date of Birth: [birth year]  [facility]) CM/SW Contact:    Corean ONEIDA Haddock, RN Phone Number: 09/17/2023, 12:03 PM   Clinical Narrative:   Transition of Care [facility]) Screening Note   Patient Details  Name: [name] Date of Birth: 03-Aug-1965   Transition of Care [facility]) CM/SW Contact:    Corean ONEIDA Haddock, RN Phone Number: 09/17/2023, 12:03 PM    Transition of Care Department [facility]) has reviewed patient and no TOC needs have been identified at this time. If new patient transition needs arise, please place a TOC consult.  Patient active with [facility] home health for RN and PT.  Channing with [facility] aware of admission   Transition of Care Asessment: Insurance and Status: Insurance coverage has been reviewed Patient has primary care physician: Yes     Prior/Current Home Services: Current home services Social Drivers of Health Review: SDOH reviewed no interventions necessary Readmission risk has been reviewed: Yes Transition of care needs: no transition of care needs at this time

## 2023-09-17 NOTE — Progress Notes (Signed)
  PROGRESS NOTE    [name]  FMW:[id number] DOB: 1965-11-03 DOA: 09/16/2023 PCP: [name], [name]  [id number]/[id number]  LOS: 1 day   Brief hospital course:   Assessment & Plan: [name] is a 58 y.o. female with medical history significant of CAD, GERD, HFpEF, previous abdominal surgery, previous SBO, obesity, T2DM who presents to the emergency department due to from home via EMS due to noted slurred speech the morning of presentation.    Presumed UTI POA --some mild burning with urination. --cont ceftriaxone  pending urine cx  Slurred speech and weakness --MRI brain neg for stroke. --PT   Type 2 diabetes mellitus with hyperglycemia Hemoglobin A1c is 9.9 on 08/27/2023 --glargine 10u BID for today --ACHS and SSI   History of CVA Continue Plavix , statin   Essential hypertension Continue amlodipine , Coreg , hydralazine , Lasix    Mixed hyperlipidemia Continue Zetia , Crestor    Obesity class II (BMI 38.90) Diet and lifestyle modification   Chronic HFpEF  Echocardiogram done on 07/26/2023 showed LVEF of 55 to 60%.  No RWMA.  Moderate LVH.  G1 DD --cont oral lasix    DVT prophylaxis: Lovenox  SQ Code Status: Full code  Family Communication:  Level of care: Med-Surg Dispo:   The patient is from: home Anticipated d/c is to: home Anticipated d/c date is: 1-2 days   Subjective and Interval History:  Pt reported speech and mental status were returning back to baseline.  Reported mild burning with urination.   Objective: Vitals:   09/16/23 2030 09/16/23 2103 09/17/23 0343 09/17/23 0831  BP: (!) 154/99 (!) 167/92 136/66 (!) 161/99  Pulse: 73 70 88 86  Resp: 19 20 20 14   Temp:  98.2 F (36.8 C) 98.1 F (36.7 C) 99.4 F (37.4 C)  TempSrc:   Oral   SpO2: 100% 100% 97% 98%  Weight:      Height:        Intake/Output Summary (Last 24 hours) at 09/17/2023 1857 Last data filed at 09/17/2023 1403 Gross per 24 hour  Intake 480 ml  Output 250 ml  Net 230 ml    Filed Weights   09/16/23 1337  Weight: 109.3 kg    Examination:   Constitutional: NAD, AAOx3 HEENT: conjunctivae and lids normal, EOMI CV: No cyanosis.   RESP: normal respiratory effort, on RA Neuro: II - XII grossly intact.   Psych: Normal mood and affect.  Appropriate judgement and reason   Data Reviewed: I have personally reviewed labs and imaging studies  Time spent: 50 minutes  Ellouise Haber, MD [facility] If 7PM-7AM, please contact night-coverage 09/17/2023, 6:57 PM

## 2023-09-18 DIAGNOSIS — N3000 Acute cystitis without hematuria: Secondary | ICD-10-CM

## 2023-09-18 LAB — URINE CULTURE: Culture: >=100000 — AB

## 2023-09-18 LAB — GLUCOSE, CAPILLARY
Glucose-Capillary: 211 mg/dL — ABNORMAL HIGH (ref 70–99)
Glucose-Capillary: 202 mg/dL — ABNORMAL HIGH (ref 70–99)

## 2023-09-18 MED ORDER — SODIUM CHLORIDE 0.9 % IV SOLN
1.0000 g | Freq: Once | INTRAVENOUS | Status: DC
  Filled 2023-09-18: qty 10

## 2023-09-18 MED ORDER — INSULIN ASPART 100 UNIT/ML IJ SOLN
10.0000 [IU] | Freq: Three times a day (TID) | INTRAMUSCULAR | Status: DC
  Administered 2023-09-18: 10 [IU] via SUBCUTANEOUS
  Filled 2023-09-18: qty 1

## 2023-09-18 MED ORDER — SODIUM CHLORIDE 0.9 % IV SOLN
1.0000 g | Freq: Once | INTRAVENOUS | Status: AC
  Administered 2023-09-18: 1 g via INTRAVENOUS
  Filled 2023-09-18: qty 10

## 2023-09-18 NOTE — TOC Progression Note (Signed)
 Transition of Care [facility]) - Progression Note    Patient Details  Name: Zakyra Kukuk MRN: [id number] Date of Birth: 08/26/1965  Transition of Care [facility]) CM/SW Contact  [place] Qu[place]al [place], [place] Phone Number: 09/18/2023, 10:21 AM  Clinical Narrative:     CSW contacted [facility], and advised of new orders for patient. CSW received confirmation from [place].                     Expected Discharge Plan and Services         Expected Discharge Date: 09/18/23                                     Social Drivers of Health (SDOH) Interventions SDOH Screenings   Food Insecurity: No Food Insecurity (09/16/2023)  Housing: Low Risk  (09/16/2023)  Transportation Needs: No Transportation Needs (09/16/2023)  Utilities: Not At Risk (09/16/2023)  Alcohol  Screen: Low Risk  (04/01/2023)  Depression (PHQ2-9): Low Risk  (06/15/2023)  Financial Resource Strain: Low Risk  (04/01/2023)  Physical Activity: Insufficiently Active (04/01/2023)  Social Connections: Moderately Isolated (04/01/2023)  Stress: No Stress Concern Present (04/01/2023)  Tobacco Use: Medium Risk (09/16/2023)  Health Literacy: Adequate Health Literacy (04/01/2023)    Readmission Risk Interventions    06/11/2021   11:43 AM 05/19/2021   12:02 PM 04/24/2021    3:02 PM  Readmission Risk Prevention Plan  Transportation Screening Complete Complete Complete  PCP or Specialist Appt within 5-7 Days  Complete   Home Care Screening  Complete   Medication Review (RN CM)  Complete   HRI or Home Care Consult   Complete  Social Work Consult for Recovery Care Planning/Counseling   --  Palliative Care Screening   Not Applicable  Medication Review Oceanographer) Complete  Complete  PCP or Specialist appointment within 3-5 days of discharge Complete    HRI or Home Care Consult Complete    SW Recovery Care/Counseling Consult Complete    Palliative Care Screening Not Applicable    Skilled Nursing Facility Not Applicable

## 2023-09-18 NOTE — Discharge Summary (Signed)
 Physician Discharge Summary   Kristel Durkee  female DOB: 06/06/1965  FMW:[id number]  PCP: [name], [name], [name]  Admit date: 09/16/2023 Discharge date: 09/18/2023  Admitted From: home Disposition:  home Home Health: Yes CODE STATUS: Full code  Discharge Instructions     Diet Carb Modified   Complete by: As directed       Hospital Course:  For full details, please see H&P, progress notes, consult notes and ancillary notes.  Briefly,  Verleen Vasha Daleo is a 58 y.o. female with medical history significant of CAD, HFpEF, previous abdominal surgery, previous SBO, obesity, T2DM who presented to the emergency department from home via EMS due to noted slurred speech the morning of presentation.    Presumed UTI POA --some mild burning with urination. --urine cx with E coli pan-sensitive.  Received 3 days of ceftriaxone .   Slurred speech and weakness --MRI brain neg for stroke. --PT/OT   Type 2 diabetes mellitus with hyperglycemia Hemoglobin A1c is 9.9 on 08/27/2023 --discharged back on home regimen as below   History of CVA Continue Plavix , statin   Essential hypertension Continue amlodipine , Coreg , hydralazine , Lasix    Mixed hyperlipidemia Continue Zetia , Crestor    Obesity class II (BMI 38.90) Diet and lifestyle modification   Chronic HFpEF  Echocardiogram done on 07/26/2023 showed LVEF of 55 to 60%.  No RWMA.  Moderate LVH.  G1 DD --cont oral lasix    Discharge Diagnoses:  Active Problems:   Essential hypertension, benign   Chronic heart failure with preserved ejection fraction (HFpEF) (HCC)   Mixed hyperlipidemia   Obesity, Class II, BMI 35-39.9   History of CVA (cerebrovascular accident)   Type 2 diabetes mellitus with hyperglycemia (HCC)   UTI (urinary tract infection)   Hypoalbuminemia due to protein-calorie malnutrition (HCC)   30 Day Unplanned Readmission Risk Score    Flowsheet Row ED to Hosp-Admission (Current) from [facility]16/[facility]  30 Day Unplanned Readmission Risk Score (%) 20.66 Filed at 09/18/2023 0800    This score is the patient's risk of an unplanned readmission within 30 days of being discharged (0 -100%). The score is based on dignosis, age, lab data, medications, orders, and past utilization.   Low:  0-14.9   Medium: 15-21.9   High: 22-29.9   Extreme: 30 and above         Discharge Instructions:  Allergies as of 09/18/2023       Reactions   Codeine Swelling, Other (See Comments)   Swelling and burning of mouth (inside)   Farxiga  [dapagliflozin ] Other (See Comments)   Recurrent UTI with farxiga    Lactose Intolerance (gi) Nausea And Vomiting        Medication List     TAKE these medications    Accu-Chek Guide Test test strip Generic drug: glucose blood USE 1 STRIP IN THE MORNING, AT  NOON, AND AT BEDTIME   albuterol  1.25 MG/3ML nebulizer solution Commonly known as: ACCUNEB  USE 1 VIAL VIA NEBULIZER TWICE  DAILY   albuterol  108 (90 Base) MCG/ACT inhaler Commonly known as: VENTOLIN  HFA USE 1 TO 2 INHALATIONS BY MOUTH  EVERY 6 HOURS AS NEEDED   amitriptyline  25 MG tablet Commonly known as: ELAVIL  Take 2-3 tablets (50-75 mg total) by mouth at bedtime.   amLODipine  10 MG tablet Commonly known as: NORVASC  TAKE 1 TABLET BY MOUTH DAILY   buprenorphine  7.5 MCG/HR Commonly known as: Butrans  Place 1 patch onto the skin once a week.   butalbital -acetaminophen -caffeine  50-325-40  MG tablet Commonly known as: FIORICET  Take 1-2 tablets by mouth every 6 (six) hours as needed for headache.   carvedilol  25 MG tablet Commonly known as: COREG  Take 25 mg by mouth 2 (two) times daily.   cholecalciferol  25 MCG (1000 UNIT) tablet Commonly known as: VITAMIN D3 Take 1 tablet (1,000 Units total) by mouth daily.   clopidogrel  75 MG tablet Commonly known as: PLAVIX  Take 1 tablet (75 mg total) by mouth daily.   cycloSPORINE  0.05 % ophthalmic emulsion Commonly  known as: RESTASIS  Place 2 drops into both eyes 2 (two) times daily.   DULoxetine  60 MG capsule Commonly known as: CYMBALTA  TAKE 1 CAPSULE BY MOUTH EVERY DAY   EPINEPHrine  0.3 mg/0.3 mL Soaj injection Commonly known as: EPI-PEN INJECT 1 PEN INTRAMUSCULARLY AS  NEEDED FOR ALLERGIC RESPONSE AS  DIRECTED BY MD. SEEK MEDICAL  HELP AFTER USE   ezetimibe  10 MG tablet Commonly known as: ZETIA  TAKE 1 TABLET BY MOUTH DAILY   FreeStyle Libre 3 Sensor Misc 1 Device by Does not apply route continuous.   FreeStyle Libre 3 Plus Sensor Misc Inject 1 Device into the skin continuous. Change every 14 days   furosemide  20 MG tablet Commonly known as: LASIX  TAKE 1 TABLET BY MOUTH DAILY   gabapentin  600 MG tablet Commonly known as: Neurontin  600 mg qAM, 600 mg qPM, 1200 mg QHS   Gvoke HypoPen  2-Pack 1 MG/0.2ML Soaj Generic drug: Glucagon  INJECT SUB-Q 1MG  ONCE AS NEEDED  FOR UP TO 1 DOSE. ONLY FOR  SEVERE LOW BLOOD SUGAR NOT  RESPONDING TO DRINKING  JUICE/GLUCOSE TABS, WHEN CBG &lt;70   hydrALAZINE  50 MG tablet Commonly known as: APRESOLINE  Take 1 tablet by mouth 3 (three) times daily.   insulin  lispro 100 UNIT/ML KwikPen Commonly known as: HumaLOG  KwikPen Inject 15 Units into the skin 3 (three) times daily. Plus Sliding scale, max dose 80 units a day   levETIRAcetam  500 MG tablet Commonly known as: KEPPRA  Take 500-750 mg by mouth See admin instructions. Take 1 tablet (500mg ) by mouth every morning and take 1 tablets by mouth (750mg ) by mouth every night   meclizine  25 MG tablet Commonly known as: ANTIVERT  Take 1 tablet (25 mg total) by mouth 3 (three) times daily as needed for dizziness.   Myrbetriq  50 MG Tb24 tablet Generic drug: mirabegron  ER TAKE 1 TABLET BY MOUTH DAILY   nitroGLYCERIN  0.4 MG SL tablet Commonly known as: NITROSTAT  Place 0.4 mg under the tongue every 5 (five) minutes as needed for chest pain.   pantoprazole  40 MG tablet Commonly known as: PROTONIX  TAKE 1  TABLET BY MOUTH DAILY AS  NEEDED   Pen Needles 32G X 4 MM Misc 1 Device by Does not apply route in the morning, at noon, in the evening, and at bedtime.   rosuvastatin  40 MG tablet Commonly known as: CRESTOR  TAKE 1 TABLET BY MOUTH DAILY   tiZANidine  4 MG tablet Commonly known as: ZANAFLEX  Take 1 tablet (4 mg total) by mouth every 8 (eight) hours as needed for muscle spasms.   Toujeo  Max SoloStar 300 UNIT/ML Solostar Pen Generic drug: insulin  glargine (2 Unit Dial ) Inject 50 Units into the skin at bedtime. Plus sliding scale, max dose 80 units a day   Trulicity  4.5 MG/0.5ML Soaj Generic drug: Dulaglutide  Inject 4.5 mg as directed once a week.   Ubrelvy  50 MG Tabs Generic drug: Ubrogepant  50 mg or 100 mg taken orally with or without food. If needed, a second dose may be  taken at least 2 hours after the initial dose. The maximum dose in a 24-hour period is 200 mg   Wixela Inhub 250-50 MCG/DOSE Aepb Generic drug: fluticasone -salmeterol Inhale 1 puff into the lungs 2 (two) times daily.         Follow-up Information     [name], [name] Follow up in 1 week(s).   Specialty: Family Medicine Contact information: [address] [place] [address] [phone]                 Allergies  Allergen Reactions   Codeine Swelling and Other (See Comments)    Swelling and burning of mouth (inside)   Farxiga  [Dapagliflozin ] Other (See Comments)    Recurrent UTI with farxiga    Lactose Intolerance (Gi) Nausea And Vomiting     The results of significant diagnostics from this hospitalization (including imaging, microbiology, ancillary and laboratory) are listed below for reference.   Consultations:   Procedures/Studies: MR BRAIN WO CONTRAST Result Date: 09/16/2023 CLINICAL DATA:  Neuro deficit, acute, stroke suspected EXAM: MRI HEAD WITHOUT CONTRAST MRA HEAD WITHOUT CONTRAST TECHNIQUE: Multiplanar, multi-echo pulse sequences of the brain and surrounding  structures were acquired without intravenous contrast. Angiographic images of the Circle of Willis were acquired using MRA technique without intravenous contrast. COMPARISON:  None Available. FINDINGS: MRI HEAD FINDINGS Brain: No acute infarction, hemorrhage, hydrocephalus, extra-axial collection or mass lesion. Vascular: See below. Skull and upper cervical spine: Normal marrow signal. Sinuses/Orbits: Clear sinuses.  No acute orbital findings. Other: No mastoid effusions. MRA HEAD FINDINGS Anterior circulation: Bilateral intracranial ICAs, MCAs, and ACAs are patent without proximal hemodynamically significant stenosis. Posterior circulation: Bilateral intradural vertebral arteries, basilar artery and bilateral posterior cerebral arteries are patent without proximal hemodynamically significant stenosis. IMPRESSION: 1. No evidence of acute intracranial abnormality. 2. No large vessel occlusion or proximal hemodynamically significant stenosis. Electronically Signed   By: Gilmore GORMAN Molt M.D.   On: 09/16/2023 16:53   MR ANGIO HEAD WO CONTRAST Result Date: 09/16/2023 CLINICAL DATA:  Neuro deficit, acute, stroke suspected EXAM: MRI HEAD WITHOUT CONTRAST MRA HEAD WITHOUT CONTRAST TECHNIQUE: Multiplanar, multi-echo pulse sequences of the brain and surrounding structures were acquired without intravenous contrast. Angiographic images of the Circle of Willis were acquired using MRA technique without intravenous contrast. COMPARISON:  None Available. FINDINGS: MRI HEAD FINDINGS Brain: No acute infarction, hemorrhage, hydrocephalus, extra-axial collection or mass lesion. Vascular: See below. Skull and upper cervical spine: Normal marrow signal. Sinuses/Orbits: Clear sinuses.  No acute orbital findings. Other: No mastoid effusions. MRA HEAD FINDINGS Anterior circulation: Bilateral intracranial ICAs, MCAs, and ACAs are patent without proximal hemodynamically significant stenosis. Posterior circulation: Bilateral intradural  vertebral arteries, basilar artery and bilateral posterior cerebral arteries are patent without proximal hemodynamically significant stenosis. IMPRESSION: 1. No evidence of acute intracranial abnormality. 2. No large vessel occlusion or proximal hemodynamically significant stenosis. Electronically Signed   By: Gilmore GORMAN Molt M.D.   On: 09/16/2023 16:53   CT Head Wo Contrast Result Date: 09/16/2023 EXAM: CT HEAD WITHOUT CONTRAST 09/16/2023 03:14:28 PM TECHNIQUE: CT of the head was performed without the administration of intravenous contrast. Automated exposure control, iterative reconstruction, and/or weight based adjustment of the mA/kV was utilized to reduce the radiation dose to as low as reasonably achievable. COMPARISON: Head CT 07/25/2023 and MRI 06/10/2022. CLINICAL HISTORY: Neuro deficit, acute, stroke suspected. Per EMS patient last know well at 2300 on 09/15/23; at 1030 today family noticed slurred speech. 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 Patient  is on Plavix . FINDINGS: BRAIN AND VENTRICLES: No acute hemorrhage. Gray-white differentiation is preserved. No hydrocephalus. No extra-axial collection. No mass effect or midline shift. Mild cerebral atrophy. Mild cerebral white matter hypodensities, similar to the prior CT and nonspecific but compatible with chronic small vessel ischemic disease. Prominent dural calcifications. ORBITS: No acute abnormality. SINUSES: No acute abnormality. SOFT TISSUES AND SKULL: No acute soft tissue abnormality. No skull fracture. IMPRESSION: 1. No acute intracranial abnormality. 2. Mild cerebral atrophy and chronic small vessel ischemic disease. Electronically signed by: Dasie Hamburg MD 09/16/2023 03:23 PM EDT RP Workstation: [id number]      Labs: BNP (last 3 results) Recent Labs    07/26/23 0430  BNP 18.4   Basic Metabolic Panel: Recent Labs  Lab 09/16/23 1331 09/16/23 2150 09/17/23 0447  NA 141  --  140  K 3.9  --  3.8  CL  108  --  107  CO2 26  --  26  GLUCOSE 243*  --  182*  BUN 19  --  16  CREATININE 0.99 0.88 0.83  CALCIUM  8.9  --  8.7*  MG  --   --  2.1  PHOS  --   --  2.8   Liver Function Tests: Recent Labs  Lab 09/16/23 1331 09/17/23 0447  AST 17 18  ALT 18 17  ALKPHOS 77 79  BILITOT 0.5 0.3  PROT 6.3* 6.9  ALBUMIN  3.0* 3.1*   No results for input(s): LIPASE, AMYLASE in the last 168 hours. No results for input(s): AMMONIA in the last 168 hours. CBC: Recent Labs  Lab 09/16/23 1331 09/16/23 2150 09/17/23 0447  WBC 6.2 5.9 6.1  NEUTROABS 3.7  --   --   HGB 10.7* 11.3* 11.0*  HCT 34.8* 36.9 35.9*  MCV 92.8 91.6 91.8  PLT 244 252 269   Cardiac Enzymes: No results for input(s): CKTOTAL, CKMB, CKMBINDEX, TROPONINI in the last 168 hours. BNP: Invalid input(s): POCBNP CBG: Recent Labs  Lab 09/16/23 2100 09/17/23 0753 09/17/23 1622 09/17/23 2017 09/18/23 0739  GLUCAP 151* 168* 213* 247* 211*   D-Dimer No results for input(s): DDIMER in the last 72 hours. Hgb A1c No results for input(s): HGBA1C in the last 72 hours. Lipid Profile No results for input(s): CHOL, HDL, LDLCALC, TRIG, CHOLHDL, LDLDIRECT in the last 72 hours. Thyroid function studies No results for input(s): TSH, T4TOTAL, T3FREE, THYROIDAB in the last 72 hours.  Invalid input(s): FREET3 Anemia work up No results for input(s): VITAMINB12, FOLATE, FERRITIN, TIBC, IRON , RETICCTPCT in the last 72 hours. Urinalysis    Component Value Date/Time   COLORURINE YELLOW (A) 09/16/2023 1916   APPEARANCEUR CLOUDY (A) 09/16/2023 1916   LABSPEC 1.020 09/16/2023 1916   PHURINE 5.0 09/16/2023 1916   GLUCOSEU NEGATIVE 09/16/2023 1916   GLUCOSEU NEGATIVE 09/21/2019 1038   HGBUR SMALL (A) 09/16/2023 1916   HGBUR negative 09/30/2006 1400   BILIRUBINUR NEGATIVE 09/16/2023 1916   BILIRUBINUR negative 06/04/2020 1535   KETONESUR NEGATIVE 09/16/2023 1916   PROTEINUR 30 (A)  09/16/2023 1916   UROBILINOGEN 0.2 06/04/2020 1535   UROBILINOGEN 0.2 09/21/2019 1038   NITRITE NEGATIVE 09/16/2023 1916   LEUKOCYTESUR LARGE (A) 09/16/2023 1916   Sepsis Labs Recent Labs  Lab 09/16/23 1331 09/16/23 2150 09/17/23 0447  WBC 6.2 5.9 6.1   Microbiology Recent Results (from the past 240 hours)  Urine Culture     Status: Abnormal   Collection Time: 09/16/23  7:16 PM   Specimen: Urine, Clean Catch  Result Value  Ref Range Status   Specimen Description   Final    URINE, CLEAN CATCH Performed at [facility], [address] [place], [place] [address]    Special Requests   Final    NONE Performed at [facility], [address] [place], [place] [address]    Culture >=100,000 COLONIES/mL ESCHERICHIA COLI (A)  Final   Report Status 09/18/2023 FINAL  Final   Organism ID, Bacteria ESCHERICHIA COLI (A)  Final      Susceptibility   Escherichia coli - MIC*    AMPICILLIN <=2 SENSITIVE Sensitive     CEFAZOLIN  (URINE) Value in next row Sensitive      <=1 SENSITIVEThis is a modified FDA-approved test that has been validated and its performance characteristics determined by the reporting laboratory.  This laboratory is certified under the Clinical Laboratory Improvement Amendments CLIA as qualified to perform high complexity clinical laboratory testing.    CEFEPIME Value in next row Sensitive      <=1 SENSITIVEThis is a modified FDA-approved test that has been validated and its performance characteristics determined by the reporting laboratory.  This laboratory is certified under the Clinical Laboratory Improvement Amendments CLIA as qualified to perform high complexity clinical laboratory testing.    ERTAPENEM Value in next row Sensitive      <=1 SENSITIVEThis is a modified FDA-approved test that has been validated and its performance characteristics determined by the reporting laboratory.  This laboratory is certified under the Clinical Laboratory Improvement  Amendments CLIA as qualified to perform high complexity clinical laboratory testing.    CEFTRIAXONE  Value in next row Sensitive      <=1 SENSITIVEThis is a modified FDA-approved test that has been validated and its performance characteristics determined by the reporting laboratory.  This laboratory is certified under the Clinical Laboratory Improvement Amendments CLIA as qualified to perform high complexity clinical laboratory testing.    CIPROFLOXACIN  Value in next row Sensitive      <=1 SENSITIVEThis is a modified FDA-approved test that has been validated and its performance characteristics determined by the reporting laboratory.  This laboratory is certified under the Clinical Laboratory Improvement Amendments CLIA as qualified to perform high complexity clinical laboratory testing.    GENTAMICIN Value in next row Sensitive      <=1 SENSITIVEThis is a modified FDA-approved test that has been validated and its performance characteristics determined by the reporting laboratory.  This laboratory is certified under the Clinical Laboratory Improvement Amendments CLIA as qualified to perform high complexity clinical laboratory testing.    NITROFURANTOIN  Value in next row Sensitive      <=1 SENSITIVEThis is a modified FDA-approved test that has been validated and its performance characteristics determined by the reporting laboratory.  This laboratory is certified under the Clinical Laboratory Improvement Amendments CLIA as qualified to perform high complexity clinical laboratory testing.    TRIMETH /SULFA  Value in next row Sensitive      <=1 SENSITIVEThis is a modified FDA-approved test that has been validated and its performance characteristics determined by the reporting laboratory.  This laboratory is certified under the Clinical Laboratory Improvement Amendments CLIA as qualified to perform high complexity clinical laboratory testing.    AMPICILLIN/SULBACTAM Value in next row Sensitive      <=1  SENSITIVEThis is a modified FDA-approved test that has been validated and its performance characteristics determined by the reporting laboratory.  This laboratory is certified under the Clinical Laboratory Improvement Amendments CLIA as qualified to perform high complexity clinical laboratory testing.  PIP/TAZO Value in next row Sensitive ug/mL     <=4 SENSITIVEThis is a modified FDA-approved test that has been validated and its performance characteristics determined by the reporting laboratory.  This laboratory is certified under the Clinical Laboratory Improvement Amendments CLIA as qualified to perform high complexity clinical laboratory testing.    MEROPENEM Value in next row Sensitive      <=4 SENSITIVEThis is a modified FDA-approved test that has been validated and its performance characteristics determined by the reporting laboratory.  This laboratory is certified under the Clinical Laboratory Improvement Amendments CLIA as qualified to perform high complexity clinical laboratory testing.    * >=100,000 COLONIES/mL ESCHERICHIA COLI     Total time spend on discharging this patient, including the last patient exam, discussing the hospital stay, instructions for ongoing care as it relates to all pertinent caregivers, as well as preparing the medical discharge records, prescriptions, and/or referrals as applicable, is 30 minutes.    Ellouise Haber, MD  [facility] 09/18/2023, 10:12 AM

## 2023-09-18 NOTE — Evaluation (Signed)
 Physical Therapy Evaluation Patient Details Name: [name] MRN: [id number] DOB: 1965/04/18 Today's Date: 09/18/2023  History of Present Illness  Pt is a 58 y/o F admitted on 09/16/23 after presenting with c/o slurred speech. Brain MRI negative for acute issues. Pt is being treated for UTI. PMH: CAD, GERD, HFpEF, abdominal sx, SBO, obesity, DM2, CVA  Clinical Impression  Pt seen for PT evaluation with pt agreeable. Pt reporting prior to admission she was ambulatory with rollator or QC, living with family, receiving HHPT & OT services. On this date, pt is able to ambulate short distance into hallway with RW & supervision, limited by feeling lightheadedness, noting this is 2/2 variable blood glucose levels - nursing staff in room to administer insulin . Recommend ongoing PT services to progress gait with LRAD, balance, & stair negotiation.        If plan is discharge home, recommend the following: A little help with walking and/or transfers;A little help with bathing/dressing/bathroom;Assistance with cooking/housework;Assist for transportation;Help with stairs or ramp for entrance   Can travel by private vehicle        Equipment Recommendations None recommended by PT  Recommendations for Other Services       Functional Status Assessment Patient has had a recent decline in their functional status and demonstrates the ability to make significant improvements in function in a reasonable and predictable amount of time.     Precautions / Restrictions Precautions Precautions: Fall Restrictions Weight Bearing Restrictions Per Provider Order: No      Mobility  Bed Mobility Overal bed mobility: Modified Independent             General bed mobility comments: supine>sit    Transfers Overall transfer level: Modified independent Equipment used: Rolling walker (2 wheels) Transfers: Sit to/from Stand Sit to Stand: Modified independent (Device/Increase time)            General transfer comment: sit>stand from EOB    Ambulation/Gait Ambulation/Gait assistance: Supervision Gait Distance (Feet): 30 Feet Assistive device: Rolling walker (2 wheels) Gait Pattern/deviations: Decreased step length - right, Decreased step length - left, Decreased stride length Gait velocity: decreased     General Gait Details: Pushes RW slightly out in front of her, gait distance limited by fatigue.  Stairs            Wheelchair Mobility     Tilt Bed    Modified Rankin (Stroke Patients Only)       Balance Overall balance assessment: Needs assistance, History of Falls Sitting-balance support: Feet supported, No upper extremity supported Sitting balance-Leahy Scale: Good     Standing balance support: During functional activity, Bilateral upper extremity supported, Reliant on assistive device for balance Standing balance-Leahy Scale: Good                               Pertinent Vitals/Pain Pain Assessment Pain Assessment: Faces Faces Pain Scale: Hurts little more Pain Location: L sided neuropathic pain Pain Descriptors / Indicators: Pins and needles Pain Intervention(s): Monitored during session, Limited activity within patient's tolerance    Home Living Family/patient expects to be discharged to:: Private residence Living Arrangements: Children Available Help at Discharge: Family;Available 24 hours/day Type of Home: House Home Access: Stairs to enter Entrance Stairs-Rails: None Entrance Stairs-Number of Steps: 1   Home Layout: One level Home Equipment: Agricultural consultant (2 wheels);Rollator (4 wheels);Cane - quad;BSC/3in1;Shower seat;Tub bench Additional Comments: rollator broken, reports she cannot always use tub  bench (has to remove it from bathroom after each use 2/2 small bathroom & others using the space, so therefore does not always get it out to use & has had falls stepping over ledge of tub). Was receiving HH PT & OT prior to  admission.    Prior Function               Mobility Comments: QC in the home, rollator outside the home; 4-5 falls in the past 6 months       Extremity/Trunk Assessment   Upper Extremity Assessment Upper Extremity Assessment: Overall WFL for tasks assessed    Lower Extremity Assessment Lower Extremity Assessment: Generalized weakness;Overall WFL for tasks assessed       Communication   Communication Communication: No apparent difficulties    Cognition Arousal: Alert Behavior During Therapy: WFL for tasks assessed/performed   PT - Cognitive impairments: No apparent impairments                         Following commands: Intact       Cueing Cueing Techniques: Verbal cues     General Comments General comments (skin integrity, edema, etc.): gait distance limited by lightheadedness with pt reporting BP has been good, reports it's 2/2 elevated blood glucose (reports she's been working with endocrinologist re: blood glucose management)    Exercises     Assessment/Plan    PT Assessment Patient needs continued PT services  PT Problem List Decreased strength;Decreased activity tolerance;Decreased balance;Decreased mobility;Impaired sensation;Pain       PT Treatment Interventions DME instruction;Balance training;Gait training;Neuromuscular re-education;Stair training;Functional mobility training;Therapeutic activities;Therapeutic exercise;Patient/family education    PT Goals (Current goals can be found in the Care Plan section)  Acute Rehab PT Goals Patient Stated Goal: feel better, go home PT Goal Formulation: With patient Time For Goal Achievement: 10/02/23 Potential to Achieve Goals: Good    Frequency Min 2X/week     Co-evaluation               AM-PAC PT 6 Clicks Mobility  Outcome Measure Help needed turning from your back to your side while in a flat bed without using bedrails?: None Help needed moving from lying on your back to  sitting on the side of a flat bed without using bedrails?: None Help needed moving to and from a bed to a chair (including a wheelchair)?: None Help needed standing up from a chair using your arms (e.g., wheelchair or bedside chair)?: None Help needed to walk in hospital room?: A Little Help needed climbing 3-5 steps with a railing? : A Little 6 Click Score: 22    End of Session   Activity Tolerance: Patient limited by fatigue Patient left: in bed;with nursing/sitter in room;with call bell/phone within reach Nurse Communication: Mobility status PT Visit Diagnosis: History of falling (Z91.81);Muscle weakness (generalized) (M62.81);Other abnormalities of gait and mobility (R26.89)    Time: 0813-0821 PT Time Calculation (min) (ACUTE ONLY): 8 min   Charges:   PT Evaluation $PT Eval Low Complexity: 1 Low   PT General Charges $$ ACUTE PT VISIT: 1 Visit         Richerd Pinal, PT, DPT 09/18/23, 8:30 AM   Richerd CHRISTELLA Pinal 09/18/2023, 8:28 AM

## 2023-09-20 LAB — GLUCOSE, CAPILLARY: Glucose-Capillary: 195 mg/dL — ABNORMAL HIGH (ref 70–99)

## 2023-09-24 DIAGNOSIS — G43909 Migraine, unspecified, not intractable, without status migrainosus: Secondary | ICD-10-CM | POA: Diagnosis not present

## 2023-09-24 DIAGNOSIS — M778 Other enthesopathies, not elsewhere classified: Secondary | ICD-10-CM | POA: Diagnosis not present

## 2023-09-24 DIAGNOSIS — I5042 Chronic combined systolic (congestive) and diastolic (congestive) heart failure: Secondary | ICD-10-CM | POA: Diagnosis not present

## 2023-09-24 DIAGNOSIS — N390 Urinary tract infection, site not specified: Secondary | ICD-10-CM | POA: Diagnosis not present

## 2023-09-24 DIAGNOSIS — N3281 Overactive bladder: Secondary | ICD-10-CM | POA: Diagnosis not present

## 2023-09-24 DIAGNOSIS — E1142 Type 2 diabetes mellitus with diabetic polyneuropathy: Secondary | ICD-10-CM | POA: Diagnosis not present

## 2023-09-24 DIAGNOSIS — H547 Unspecified visual loss: Secondary | ICD-10-CM | POA: Diagnosis not present

## 2023-09-24 DIAGNOSIS — N181 Chronic kidney disease, stage 1: Secondary | ICD-10-CM | POA: Diagnosis not present

## 2023-09-24 DIAGNOSIS — I69354 Hemiplegia and hemiparesis following cerebral infarction affecting left non-dominant side: Secondary | ICD-10-CM | POA: Diagnosis not present

## 2023-09-24 DIAGNOSIS — K219 Gastro-esophageal reflux disease without esophagitis: Secondary | ICD-10-CM | POA: Diagnosis not present

## 2023-09-24 DIAGNOSIS — G4733 Obstructive sleep apnea (adult) (pediatric): Secondary | ICD-10-CM | POA: Diagnosis not present

## 2023-09-24 DIAGNOSIS — S92352D Displaced fracture of fifth metatarsal bone, left foot, subsequent encounter for fracture with routine healing: Secondary | ICD-10-CM | POA: Diagnosis not present

## 2023-09-24 DIAGNOSIS — M62838 Other muscle spasm: Secondary | ICD-10-CM | POA: Diagnosis not present

## 2023-09-24 DIAGNOSIS — G4700 Insomnia, unspecified: Secondary | ICD-10-CM | POA: Diagnosis not present

## 2023-09-24 DIAGNOSIS — I130 Hypertensive heart and chronic kidney disease with heart failure and stage 1 through stage 4 chronic kidney disease, or unspecified chronic kidney disease: Secondary | ICD-10-CM | POA: Diagnosis not present

## 2023-09-24 DIAGNOSIS — E1122 Type 2 diabetes mellitus with diabetic chronic kidney disease: Secondary | ICD-10-CM | POA: Diagnosis not present

## 2023-09-24 DIAGNOSIS — E46 Unspecified protein-calorie malnutrition: Secondary | ICD-10-CM | POA: Diagnosis not present

## 2023-09-24 DIAGNOSIS — E782 Mixed hyperlipidemia: Secondary | ICD-10-CM | POA: Diagnosis not present

## 2023-09-24 DIAGNOSIS — E1165 Type 2 diabetes mellitus with hyperglycemia: Secondary | ICD-10-CM | POA: Diagnosis not present

## 2023-09-24 DIAGNOSIS — I2510 Atherosclerotic heart disease of native coronary artery without angina pectoris: Secondary | ICD-10-CM | POA: Diagnosis not present

## 2023-09-24 DIAGNOSIS — R4701 Aphasia: Secondary | ICD-10-CM | POA: Diagnosis not present

## 2023-09-24 DIAGNOSIS — J4489 Other specified chronic obstructive pulmonary disease: Secondary | ICD-10-CM | POA: Diagnosis not present

## 2023-09-26 ENCOUNTER — Other Ambulatory Visit: Payer: Self-pay | Admitting: Nurse Practitioner

## 2023-09-26 DIAGNOSIS — G890 Central pain syndrome: Secondary | ICD-10-CM

## 2023-09-26 DIAGNOSIS — E1142 Type 2 diabetes mellitus with diabetic polyneuropathy: Secondary | ICD-10-CM

## 2023-09-26 DIAGNOSIS — G894 Chronic pain syndrome: Secondary | ICD-10-CM

## 2023-10-04 DIAGNOSIS — G4733 Obstructive sleep apnea (adult) (pediatric): Secondary | ICD-10-CM | POA: Diagnosis not present

## 2023-10-04 DIAGNOSIS — G43909 Migraine, unspecified, not intractable, without status migrainosus: Secondary | ICD-10-CM | POA: Diagnosis not present

## 2023-10-04 DIAGNOSIS — S92352D Displaced fracture of fifth metatarsal bone, left foot, subsequent encounter for fracture with routine healing: Secondary | ICD-10-CM | POA: Diagnosis not present

## 2023-10-04 DIAGNOSIS — E782 Mixed hyperlipidemia: Secondary | ICD-10-CM | POA: Diagnosis not present

## 2023-10-04 DIAGNOSIS — R569 Unspecified convulsions: Secondary | ICD-10-CM | POA: Diagnosis not present

## 2023-10-04 DIAGNOSIS — E46 Unspecified protein-calorie malnutrition: Secondary | ICD-10-CM | POA: Diagnosis not present

## 2023-10-04 DIAGNOSIS — Z794 Long term (current) use of insulin: Secondary | ICD-10-CM | POA: Diagnosis not present

## 2023-10-04 DIAGNOSIS — G4700 Insomnia, unspecified: Secondary | ICD-10-CM | POA: Diagnosis not present

## 2023-10-04 DIAGNOSIS — E1122 Type 2 diabetes mellitus with diabetic chronic kidney disease: Secondary | ICD-10-CM | POA: Diagnosis not present

## 2023-10-04 DIAGNOSIS — I5042 Chronic combined systolic (congestive) and diastolic (congestive) heart failure: Secondary | ICD-10-CM | POA: Diagnosis not present

## 2023-10-04 DIAGNOSIS — I10 Essential (primary) hypertension: Secondary | ICD-10-CM | POA: Diagnosis not present

## 2023-10-04 DIAGNOSIS — N181 Chronic kidney disease, stage 1: Secondary | ICD-10-CM | POA: Diagnosis not present

## 2023-10-04 DIAGNOSIS — H547 Unspecified visual loss: Secondary | ICD-10-CM | POA: Diagnosis not present

## 2023-10-04 DIAGNOSIS — N3281 Overactive bladder: Secondary | ICD-10-CM | POA: Diagnosis not present

## 2023-10-04 DIAGNOSIS — E1142 Type 2 diabetes mellitus with diabetic polyneuropathy: Secondary | ICD-10-CM | POA: Diagnosis not present

## 2023-10-04 DIAGNOSIS — R4701 Aphasia: Secondary | ICD-10-CM | POA: Diagnosis not present

## 2023-10-04 DIAGNOSIS — M62838 Other muscle spasm: Secondary | ICD-10-CM | POA: Diagnosis not present

## 2023-10-04 DIAGNOSIS — K219 Gastro-esophageal reflux disease without esophagitis: Secondary | ICD-10-CM | POA: Diagnosis not present

## 2023-10-04 DIAGNOSIS — M778 Other enthesopathies, not elsewhere classified: Secondary | ICD-10-CM | POA: Diagnosis not present

## 2023-10-04 DIAGNOSIS — Z8673 Personal history of transient ischemic attack (TIA), and cerebral infarction without residual deficits: Secondary | ICD-10-CM | POA: Diagnosis not present

## 2023-10-04 DIAGNOSIS — E1165 Type 2 diabetes mellitus with hyperglycemia: Secondary | ICD-10-CM | POA: Diagnosis not present

## 2023-10-04 DIAGNOSIS — J4489 Other specified chronic obstructive pulmonary disease: Secondary | ICD-10-CM | POA: Diagnosis not present

## 2023-10-04 DIAGNOSIS — I69354 Hemiplegia and hemiparesis following cerebral infarction affecting left non-dominant side: Secondary | ICD-10-CM | POA: Diagnosis not present

## 2023-10-04 DIAGNOSIS — I130 Hypertensive heart and chronic kidney disease with heart failure and stage 1 through stage 4 chronic kidney disease, or unspecified chronic kidney disease: Secondary | ICD-10-CM | POA: Diagnosis not present

## 2023-10-04 DIAGNOSIS — N390 Urinary tract infection, site not specified: Secondary | ICD-10-CM | POA: Diagnosis not present

## 2023-10-04 DIAGNOSIS — I6389 Other cerebral infarction: Secondary | ICD-10-CM | POA: Diagnosis not present

## 2023-10-04 DIAGNOSIS — I2510 Atherosclerotic heart disease of native coronary artery without angina pectoris: Secondary | ICD-10-CM | POA: Diagnosis not present

## 2023-10-05 ENCOUNTER — Encounter: Payer: Self-pay | Admitting: Student in an Organized Health Care Education/Training Program

## 2023-10-06 DIAGNOSIS — J449 Chronic obstructive pulmonary disease, unspecified: Secondary | ICD-10-CM | POA: Diagnosis not present

## 2023-10-06 DIAGNOSIS — I69354 Hemiplegia and hemiparesis following cerebral infarction affecting left non-dominant side: Secondary | ICD-10-CM | POA: Diagnosis not present

## 2023-10-06 DIAGNOSIS — G4700 Insomnia, unspecified: Secondary | ICD-10-CM | POA: Diagnosis not present

## 2023-10-06 DIAGNOSIS — I130 Hypertensive heart and chronic kidney disease with heart failure and stage 1 through stage 4 chronic kidney disease, or unspecified chronic kidney disease: Secondary | ICD-10-CM | POA: Diagnosis not present

## 2023-10-06 DIAGNOSIS — N181 Chronic kidney disease, stage 1: Secondary | ICD-10-CM | POA: Diagnosis not present

## 2023-10-06 DIAGNOSIS — E782 Mixed hyperlipidemia: Secondary | ICD-10-CM | POA: Diagnosis not present

## 2023-10-06 DIAGNOSIS — R4701 Aphasia: Secondary | ICD-10-CM | POA: Diagnosis not present

## 2023-10-06 DIAGNOSIS — G43909 Migraine, unspecified, not intractable, without status migrainosus: Secondary | ICD-10-CM | POA: Diagnosis not present

## 2023-10-06 DIAGNOSIS — M778 Other enthesopathies, not elsewhere classified: Secondary | ICD-10-CM | POA: Diagnosis not present

## 2023-10-06 DIAGNOSIS — I5042 Chronic combined systolic (congestive) and diastolic (congestive) heart failure: Secondary | ICD-10-CM | POA: Diagnosis not present

## 2023-10-06 DIAGNOSIS — E46 Unspecified protein-calorie malnutrition: Secondary | ICD-10-CM | POA: Diagnosis not present

## 2023-10-06 DIAGNOSIS — E1142 Type 2 diabetes mellitus with diabetic polyneuropathy: Secondary | ICD-10-CM | POA: Diagnosis not present

## 2023-10-06 DIAGNOSIS — N3281 Overactive bladder: Secondary | ICD-10-CM | POA: Diagnosis not present

## 2023-10-06 DIAGNOSIS — G4733 Obstructive sleep apnea (adult) (pediatric): Secondary | ICD-10-CM | POA: Diagnosis not present

## 2023-10-06 DIAGNOSIS — H547 Unspecified visual loss: Secondary | ICD-10-CM | POA: Diagnosis not present

## 2023-10-06 DIAGNOSIS — K219 Gastro-esophageal reflux disease without esophagitis: Secondary | ICD-10-CM | POA: Diagnosis not present

## 2023-10-06 DIAGNOSIS — M62838 Other muscle spasm: Secondary | ICD-10-CM | POA: Diagnosis not present

## 2023-10-06 DIAGNOSIS — I2510 Atherosclerotic heart disease of native coronary artery without angina pectoris: Secondary | ICD-10-CM | POA: Diagnosis not present

## 2023-10-06 DIAGNOSIS — E1122 Type 2 diabetes mellitus with diabetic chronic kidney disease: Secondary | ICD-10-CM | POA: Diagnosis not present

## 2023-10-06 DIAGNOSIS — S92352D Displaced fracture of fifth metatarsal bone, left foot, subsequent encounter for fracture with routine healing: Secondary | ICD-10-CM | POA: Diagnosis not present

## 2023-10-11 ENCOUNTER — Encounter: Payer: Self-pay | Admitting: "Endocrinology

## 2023-10-11 ENCOUNTER — Encounter: Payer: Self-pay | Admitting: Family Medicine

## 2023-10-11 DIAGNOSIS — E1165 Type 2 diabetes mellitus with hyperglycemia: Secondary | ICD-10-CM

## 2023-10-12 DIAGNOSIS — J449 Chronic obstructive pulmonary disease, unspecified: Secondary | ICD-10-CM | POA: Diagnosis not present

## 2023-10-12 DIAGNOSIS — G4700 Insomnia, unspecified: Secondary | ICD-10-CM | POA: Diagnosis not present

## 2023-10-12 DIAGNOSIS — E782 Mixed hyperlipidemia: Secondary | ICD-10-CM | POA: Diagnosis not present

## 2023-10-12 DIAGNOSIS — S92352D Displaced fracture of fifth metatarsal bone, left foot, subsequent encounter for fracture with routine healing: Secondary | ICD-10-CM | POA: Diagnosis not present

## 2023-10-12 DIAGNOSIS — I130 Hypertensive heart and chronic kidney disease with heart failure and stage 1 through stage 4 chronic kidney disease, or unspecified chronic kidney disease: Secondary | ICD-10-CM | POA: Diagnosis not present

## 2023-10-12 DIAGNOSIS — M778 Other enthesopathies, not elsewhere classified: Secondary | ICD-10-CM | POA: Diagnosis not present

## 2023-10-12 DIAGNOSIS — I69354 Hemiplegia and hemiparesis following cerebral infarction affecting left non-dominant side: Secondary | ICD-10-CM | POA: Diagnosis not present

## 2023-10-12 DIAGNOSIS — G4733 Obstructive sleep apnea (adult) (pediatric): Secondary | ICD-10-CM | POA: Diagnosis not present

## 2023-10-12 DIAGNOSIS — K219 Gastro-esophageal reflux disease without esophagitis: Secondary | ICD-10-CM | POA: Diagnosis not present

## 2023-10-12 DIAGNOSIS — M62838 Other muscle spasm: Secondary | ICD-10-CM | POA: Diagnosis not present

## 2023-10-12 DIAGNOSIS — I2510 Atherosclerotic heart disease of native coronary artery without angina pectoris: Secondary | ICD-10-CM | POA: Diagnosis not present

## 2023-10-12 DIAGNOSIS — H547 Unspecified visual loss: Secondary | ICD-10-CM | POA: Diagnosis not present

## 2023-10-12 DIAGNOSIS — G43909 Migraine, unspecified, not intractable, without status migrainosus: Secondary | ICD-10-CM | POA: Diagnosis not present

## 2023-10-12 DIAGNOSIS — E46 Unspecified protein-calorie malnutrition: Secondary | ICD-10-CM | POA: Diagnosis not present

## 2023-10-12 DIAGNOSIS — N3281 Overactive bladder: Secondary | ICD-10-CM | POA: Diagnosis not present

## 2023-10-12 DIAGNOSIS — I5042 Chronic combined systolic (congestive) and diastolic (congestive) heart failure: Secondary | ICD-10-CM | POA: Diagnosis not present

## 2023-10-12 DIAGNOSIS — E1122 Type 2 diabetes mellitus with diabetic chronic kidney disease: Secondary | ICD-10-CM | POA: Diagnosis not present

## 2023-10-12 DIAGNOSIS — R4701 Aphasia: Secondary | ICD-10-CM | POA: Diagnosis not present

## 2023-10-12 DIAGNOSIS — E1142 Type 2 diabetes mellitus with diabetic polyneuropathy: Secondary | ICD-10-CM | POA: Diagnosis not present

## 2023-10-12 DIAGNOSIS — N181 Chronic kidney disease, stage 1: Secondary | ICD-10-CM | POA: Diagnosis not present

## 2023-10-12 MED ORDER — FREESTYLE LIBRE 3 PLUS SENSOR MISC: 1.0000 | 3 refills | Status: AC

## 2023-10-13 ENCOUNTER — Inpatient Hospital Stay: Admitting: Family Medicine

## 2023-10-13 DIAGNOSIS — J301 Allergic rhinitis due to pollen: Secondary | ICD-10-CM | POA: Diagnosis not present

## 2023-10-13 DIAGNOSIS — J449 Chronic obstructive pulmonary disease, unspecified: Secondary | ICD-10-CM | POA: Diagnosis not present

## 2023-10-13 DIAGNOSIS — J4489 Other specified chronic obstructive pulmonary disease: Secondary | ICD-10-CM | POA: Diagnosis not present

## 2023-10-13 DIAGNOSIS — G4733 Obstructive sleep apnea (adult) (pediatric): Secondary | ICD-10-CM | POA: Diagnosis not present

## 2023-10-14 ENCOUNTER — Ambulatory Visit: Attending: Nurse Practitioner | Admitting: Nurse Practitioner

## 2023-10-14 ENCOUNTER — Other Ambulatory Visit: Payer: Self-pay | Admitting: Pulmonary Disease

## 2023-10-14 ENCOUNTER — Encounter: Payer: Self-pay | Admitting: Nurse Practitioner

## 2023-10-14 ENCOUNTER — Ambulatory Visit: Admitting: Nurse Practitioner

## 2023-10-14 ENCOUNTER — Ambulatory Visit: Admitting: "Endocrinology

## 2023-10-14 VITALS — BP 156/83 | HR 86 | Temp 97.9°F | Resp 18 | Ht 65.0 in | Wt 240.0 lb

## 2023-10-14 VITALS — BP 144/76 | HR 89 | Resp 18 | Ht 65.0 in | Wt 254.1 lb

## 2023-10-14 DIAGNOSIS — E66812 Obesity, class 2: Secondary | ICD-10-CM

## 2023-10-14 DIAGNOSIS — M25552 Pain in left hip: Secondary | ICD-10-CM | POA: Diagnosis not present

## 2023-10-14 DIAGNOSIS — Z7189 Other specified counseling: Secondary | ICD-10-CM | POA: Insufficient documentation

## 2023-10-14 DIAGNOSIS — K219 Gastro-esophageal reflux disease without esophagitis: Secondary | ICD-10-CM

## 2023-10-14 DIAGNOSIS — I5032 Chronic diastolic (congestive) heart failure: Secondary | ICD-10-CM | POA: Diagnosis not present

## 2023-10-14 DIAGNOSIS — G4733 Obstructive sleep apnea (adult) (pediatric): Secondary | ICD-10-CM | POA: Diagnosis not present

## 2023-10-14 DIAGNOSIS — G894 Chronic pain syndrome: Secondary | ICD-10-CM

## 2023-10-14 DIAGNOSIS — N3946 Mixed incontinence: Secondary | ICD-10-CM

## 2023-10-14 DIAGNOSIS — F330 Major depressive disorder, recurrent, mild: Secondary | ICD-10-CM

## 2023-10-14 DIAGNOSIS — E1142 Type 2 diabetes mellitus with diabetic polyneuropathy: Secondary | ICD-10-CM

## 2023-10-14 DIAGNOSIS — M25551 Pain in right hip: Secondary | ICD-10-CM | POA: Diagnosis not present

## 2023-10-14 DIAGNOSIS — E782 Mixed hyperlipidemia: Secondary | ICD-10-CM

## 2023-10-14 DIAGNOSIS — G8911 Acute pain due to trauma: Secondary | ICD-10-CM | POA: Diagnosis not present

## 2023-10-14 DIAGNOSIS — R4781 Slurred speech: Secondary | ICD-10-CM

## 2023-10-14 DIAGNOSIS — Z79899 Other long term (current) drug therapy: Secondary | ICD-10-CM | POA: Diagnosis not present

## 2023-10-14 DIAGNOSIS — Z23 Encounter for immunization: Secondary | ICD-10-CM

## 2023-10-14 DIAGNOSIS — N3000 Acute cystitis without hematuria: Secondary | ICD-10-CM | POA: Diagnosis not present

## 2023-10-14 DIAGNOSIS — J449 Chronic obstructive pulmonary disease, unspecified: Secondary | ICD-10-CM

## 2023-10-14 DIAGNOSIS — Z0289 Encounter for other administrative examinations: Secondary | ICD-10-CM | POA: Diagnosis not present

## 2023-10-14 DIAGNOSIS — M25512 Pain in left shoulder: Secondary | ICD-10-CM | POA: Insufficient documentation

## 2023-10-14 DIAGNOSIS — J4489 Other specified chronic obstructive pulmonary disease: Secondary | ICD-10-CM

## 2023-10-14 DIAGNOSIS — N3281 Overactive bladder: Secondary | ICD-10-CM

## 2023-10-14 DIAGNOSIS — I10 Essential (primary) hypertension: Secondary | ICD-10-CM

## 2023-10-14 DIAGNOSIS — E1169 Type 2 diabetes mellitus with other specified complication: Secondary | ICD-10-CM

## 2023-10-14 DIAGNOSIS — E1165 Type 2 diabetes mellitus with hyperglycemia: Secondary | ICD-10-CM

## 2023-10-14 DIAGNOSIS — Z794 Long term (current) use of insulin: Secondary | ICD-10-CM | POA: Diagnosis not present

## 2023-10-14 DIAGNOSIS — Z1231 Encounter for screening mammogram for malignant neoplasm of breast: Secondary | ICD-10-CM

## 2023-10-14 DIAGNOSIS — G40A09 Absence epileptic syndrome, not intractable, without status epilepticus: Secondary | ICD-10-CM

## 2023-10-14 DIAGNOSIS — Z8673 Personal history of transient ischemic attack (TIA), and cerebral infarction without residual deficits: Secondary | ICD-10-CM

## 2023-10-14 DIAGNOSIS — I69354 Hemiplegia and hemiparesis following cerebral infarction affecting left non-dominant side: Secondary | ICD-10-CM

## 2023-10-14 MED ORDER — METHOCARBAMOL 1000 MG/10ML IJ SOLN
200.0000 mg | Freq: Once | INTRAMUSCULAR | Status: AC
  Administered 2023-10-14: 200 mg via INTRAMUSCULAR
  Filled 2023-10-14: qty 10

## 2023-10-14 MED ORDER — MIRABEGRON ER 50 MG PO TB24: 50.0000 mg | ORAL_TABLET | Freq: Every day | ORAL | 1 refills | Status: AC

## 2023-10-14 MED ORDER — OXYCODONE-ACETAMINOPHEN 5-325 MG PO TABS: 1.0000 | ORAL_TABLET | Freq: Three times a day (TID) | ORAL | 0 refills | Status: AC | PRN

## 2023-10-14 MED ORDER — DULOXETINE HCL 60 MG PO CPEP: 60.0000 mg | ORAL_CAPSULE | Freq: Every day | ORAL | 1 refills | Status: AC

## 2023-10-14 MED ORDER — AMLODIPINE BESYLATE 10 MG PO TABS: 10.0000 mg | ORAL_TABLET | Freq: Every day | ORAL | 1 refills | Status: AC

## 2023-10-14 MED ORDER — EZETIMIBE 10 MG PO TABS: 10.0000 mg | ORAL_TABLET | Freq: Every day | ORAL | 1 refills | Status: AC

## 2023-10-14 MED ORDER — KETOROLAC TROMETHAMINE 60 MG/2ML IM SOLN
60.0000 mg | Freq: Once | INTRAMUSCULAR | Status: AC
  Administered 2023-10-14: 60 mg via INTRAMUSCULAR
  Filled 2023-10-14: qty 2

## 2023-10-14 MED ORDER — FUROSEMIDE 20 MG PO TABS: 20.0000 mg | ORAL_TABLET | Freq: Every day | ORAL | 1 refills | Status: AC

## 2023-10-14 NOTE — Progress Notes (Signed)
 BP (!) 144/76   Pulse 89   Resp 18   Ht 5' 5 (1.651 m)   Wt 254 lb 1.6 oz (115.3 kg)   LMP  (LMP Unknown)   SpO2 98%   BMI 42.28 kg/m    Subjective:    Patient ID: [name], female    DOB: [birth year], 58 y.o.   MRN: [id number]  HPI: [name] is a 58 y.o. female  Chief Complaint  Patient presents with   Hospitalization Follow-up   Medical Management of Chronic Issues   Medication Refill   Discussed the use of AI scribe software for clinical note transcription with the patient, who gave verbal consent to proceed.  History of Present Illness [name] is a 58 year old female with coronary artery disease, heart failure, and type two diabetes who presents for a hospital follow-up after recent admission for slurred speech and presumed UTI.  Neurological symptoms and recent hospitalization - Hospitalized August 28-30, 2025 for slurred speech; MRI brain negative for stroke - Treated for presumed urinary tract infection with E. coli; received three days of cefotaxime - Frustration with lack of symptoms indicating urinary tract infection - History of seizures, managed with Keppra  500 mg in the morning and 750 mg at bedtime; last neurology follow-up January 26, 2023  Glycemic control and diabetes management - Type 2 diabetes managed with Trulicity  4.5 mg weekly, Toujeo  50 units at bedtime, and Humalog  15 units three times daily - Uses Freestyle Libre for glucose monitoring - Hemoglobin A1c was 9.9% on August 27, 2023 - Blood glucose described as stable  Cardiovascular disease and heart failure - Coronary artery disease and heart failure managed with amlodipine  10 mg daily, Coreg  25 mg twice daily, hydralazine  50 mg three times daily, and Lasix  20 mg daily - Plavix  75 mg daily and rosuvastatin  40 mg daily for cardiovascular risk reduction   Hypertension - Managed with amlodipine , Coreg , hydralazine , and olmesartan  40 mg daily  Mixed  hyperlipidemia - Managed with Zetia  10 mg daily and rosuvastatin  40 mg daily  Obesity - History of obesity Body mass index is 42.28 kg/m. Filed Weights   10/14/23 1302  Weight: 254 lb 1.6 oz (115.3 kg)     Chronic pain syndrome - Managed by pain management with Percocet and gabapentin  600 mg in the morning, 600 mg at lunchtime, and 1200 mg at bedtime  Respiratory symptoms and sleep apnea - Sleep apnea managed with CPAP - COPD with asthma managed with albuterol  nebulizer twice daily as needed, Advair Diskus twice daily, and albuterol  inhaler as needed - Recent pulmonology evaluation with no changes in regimen  Gastroesophageal reflux disease (gerd) - Managed with pantoprazole  40 mg daily as needed  Functional status and fall risk - Considering assisted living facility due to multiple falls and difficulty managing health independently - Potential locations include [place] and [facility] status - Interested in receiving influenza vaccination - Received pneumococcal vaccination in 2021; no need for repeat at this time    Admit date: 09/16/2023 Discharge date: 09/18/2023   Admitted From: home Disposition:  home Home Health: Yes CODE STATUS: Full code   Discharge Instructions       Diet Carb Modified   Complete by: As directed           Hospital Course:  For full details, please see H&P, progress notes, consult notes and ancillary notes.  Briefly,  [name] is a 58 y.o. female with  medical history significant of CAD, HFpEF, previous abdominal surgery, previous SBO, obesity, T2DM who presented to the emergency department from home via EMS due to noted slurred speech the morning of presentation.    Presumed UTI POA --some mild burning with urination. --urine cx with E coli pan-sensitive.  Received 3 days of ceftriaxone .   Slurred speech and weakness --MRI brain neg for stroke. --PT/OT   Type 2 diabetes mellitus with  hyperglycemia Hemoglobin A1c is 9.9 on 08/27/2023 --discharged back on home regimen as below   History of CVA Continue Plavix , statin   Essential hypertension Continue amlodipine , Coreg , hydralazine , Lasix    Mixed hyperlipidemia Continue Zetia , Crestor    Obesity class II (BMI 38.90) Diet and lifestyle modification   Chronic HFpEF  Echocardiogram done on 07/26/2023 showed LVEF of 55 to 60%.  No RWMA.  Moderate LVH.  G1 DD --cont oral lasix      Discharge Diagnoses:  Active Problems:   Essential hypertension, benign   Chronic heart failure with preserved ejection fraction (HFpEF) (HCC)   Mixed hyperlipidemia   Obesity, Class II, BMI 35-39.9   History of CVA (cerebrovascular accident)   Type 2 diabetes mellitus with hyperglycemia (HCC)   UTI (urinary tract infection)   Hypoalbuminemia due to protein-calorie malnutrition (HCC)   Medication List       TAKE these medications     Accu-Chek Guide Test test strip Generic drug: glucose blood USE 1 STRIP IN THE MORNING, AT  NOON, AND AT BEDTIME    albuterol  1.25 MG/3ML nebulizer solution Commonly known as: ACCUNEB  USE 1 VIAL VIA NEBULIZER TWICE  DAILY    albuterol  108 (90 Base) MCG/ACT inhaler Commonly known as: VENTOLIN  HFA USE 1 TO 2 INHALATIONS BY MOUTH  EVERY 6 HOURS AS NEEDED    amitriptyline  25 MG tablet Commonly known as: ELAVIL  Take 2-3 tablets (50-75 mg total) by mouth at bedtime.    amLODipine  10 MG tablet Commonly known as: NORVASC  TAKE 1 TABLET BY MOUTH DAILY    buprenorphine  7.5 MCG/HR Commonly known as: Butrans  Place 1 patch onto the skin once a week.    butalbital -acetaminophen -caffeine  50-325-40 MG tablet Commonly known as: FIORICET  Take 1-2 tablets by mouth every 6 (six) hours as needed for headache.    carvedilol  25 MG tablet Commonly known as: COREG  Take 25 mg by mouth 2 (two) times daily.    cholecalciferol  25 MCG (1000 UNIT) tablet Commonly known as: VITAMIN D3 Take 1 tablet (1,000 Units  total) by mouth daily.    clopidogrel  75 MG tablet Commonly known as: PLAVIX  Take 1 tablet (75 mg total) by mouth daily.    cycloSPORINE  0.05 % ophthalmic emulsion Commonly known as: RESTASIS  Place 2 drops into both eyes 2 (two) times daily.    DULoxetine  60 MG capsule Commonly known as: CYMBALTA  TAKE 1 CAPSULE BY MOUTH EVERY DAY    EPINEPHrine  0.3 mg/0.3 mL Soaj injection Commonly known as: EPI-PEN INJECT 1 PEN INTRAMUSCULARLY AS  NEEDED FOR ALLERGIC RESPONSE AS  DIRECTED BY MD. SEEK MEDICAL  HELP AFTER USE    ezetimibe  10 MG tablet Commonly known as: ZETIA  TAKE 1 TABLET BY MOUTH DAILY    FreeStyle Libre 3 Sensor Misc 1 Device by Does not apply route continuous.    FreeStyle Libre 3 Plus Sensor Misc Inject 1 Device into the skin continuous. Change every 14 days    furosemide  20 MG tablet Commonly known as: LASIX  TAKE 1 TABLET BY MOUTH DAILY    gabapentin  600 MG tablet Commonly known  as: Neurontin  600 mg qAM, 600 mg qPM, 1200 mg QHS    Gvoke HypoPen  2-Pack 1 MG/0.2ML Soaj Generic drug: Glucagon  INJECT SUB-Q 1MG  ONCE AS NEEDED  FOR UP TO 1 DOSE. ONLY FOR  SEVERE LOW BLOOD SUGAR NOT  RESPONDING TO DRINKING  JUICE/GLUCOSE TABS, WHEN CBG &lt;70    hydrALAZINE  50 MG tablet Commonly known as: APRESOLINE  Take 1 tablet by mouth 3 (three) times daily.    insulin  lispro 100 UNIT/ML KwikPen Commonly known as: HumaLOG  KwikPen Inject 15 Units into the skin 3 (three) times daily. Plus Sliding scale, max dose 80 units a day    levETIRAcetam  500 MG tablet Commonly known as: KEPPRA  Take 500-750 mg by mouth See admin instructions. Take 1 tablet (500mg ) by mouth every morning and take 1 tablets by mouth (750mg ) by mouth every night    meclizine  25 MG tablet Commonly known as: ANTIVERT  Take 1 tablet (25 mg total) by mouth 3 (three) times daily as needed for dizziness.    Myrbetriq  50 MG Tb24 tablet Generic drug: mirabegron  ER TAKE 1 TABLET BY MOUTH DAILY    nitroGLYCERIN  0.4  MG SL tablet Commonly known as: NITROSTAT  Place 0.4 mg under the tongue every 5 (five) minutes as needed for chest pain.    pantoprazole  40 MG tablet Commonly known as: PROTONIX  TAKE 1 TABLET BY MOUTH DAILY AS  NEEDED    Pen Needles 32G X 4 MM Misc 1 Device by Does not apply route in the morning, at noon, in the evening, and at bedtime.    rosuvastatin  40 MG tablet Commonly known as: CRESTOR  TAKE 1 TABLET BY MOUTH DAILY    tiZANidine  4 MG tablet Commonly known as: ZANAFLEX  Take 1 tablet (4 mg total) by mouth every 8 (eight) hours as needed for muscle spasms.    Toujeo  Max SoloStar 300 UNIT/ML Solostar Pen Generic drug: insulin  glargine (2 Unit Dial ) Inject 50 Units into the skin at bedtime. Plus sliding scale, max dose 80 units a day    Trulicity  4.5 MG/0.5ML Soaj Generic drug: Dulaglutide  Inject 4.5 mg as directed once a week.    Ubrelvy  50 MG Tabs Generic drug: Ubrogepant  50 mg or 100 mg taken orally with or without food. If needed, a second dose may be taken at least 2 hours after the initial dose. The maximum dose in a 24-hour period is 200 mg    Wixela Inhub 250-50 MCG/DOSE Aepb Generic drug: fluticasone -salmeterol Inhale 1 puff into the lungs 2 (two) times daily.       EXAM: CT HEAD WITHOUT CONTRAST 09/16/2023 03:14:28 PM   TECHNIQUE: CT of the head was performed without the administration of intravenous contrast. Automated exposure control, iterative reconstruction, and/or weight based adjustment of the mA/kV was utilized to reduce the radiation dose to as low as reasonably achievable.   COMPARISON: Head CT 07/25/2023 and MRI 06/10/2022.   CLINICAL HISTORY: Neuro deficit, acute, stroke suspected. Per EMS patient last know well at 2300 on 09/15/23; at 1030 today family noticed slurred speech. 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 Patient is on Plavix .   FINDINGS:   BRAIN AND VENTRICLES: No acute hemorrhage. Gray-white  differentiation is preserved. No hydrocephalus. No extra-axial collection. No mass effect or midline shift. Mild cerebral atrophy. Mild cerebral white matter hypodensities, similar to the prior CT and nonspecific but compatible with chronic small vessel ischemic disease. Prominent dural calcifications.   ORBITS: No acute abnormality.   SINUSES: No acute abnormality.   SOFT TISSUES  AND SKULL: No acute soft tissue abnormality. No skull fracture.   IMPRESSION: 1. No acute intracranial abnormality. 2. Mild cerebral atrophy and chronic small vessel ischemic disease.  EXAM: MRI HEAD WITHOUT CONTRAST  MRA HEAD WITHOUT CONTRAST  TECHNIQUE: Multiplanar, multi-echo pulse sequences of the brain and surrounding structures were acquired without intravenous contrast. Angiographic ...   Study Result  Narrative & Impression  CLINICAL DATA:  Neuro deficit, acute, stroke suspected   EXAM: MRI HEAD WITHOUT CONTRAST   MRA HEAD WITHOUT CONTRAST   TECHNIQUE: Multiplanar, multi-echo pulse sequences of the brain and surrounding structures were acquired without intravenous contrast. Angiographic images of the Circle of Willis were acquired using MRA technique without intravenous contrast.   COMPARISON:  None Available.   FINDINGS: MRI HEAD FINDINGS   Brain: No acute infarction, hemorrhage, hydrocephalus, extra-axial collection or mass lesion.   Vascular: See below.   Skull and upper cervical spine: Normal marrow signal.   Sinuses/Orbits: Clear sinuses.  No acute orbital findings.   Other: No mastoid effusions.   MRA HEAD FINDINGS   Anterior circulation: Bilateral intracranial ICAs, MCAs, and ACAs are patent without proximal hemodynamically significant stenosis.   Posterior circulation: Bilateral intradural vertebral arteries, basilar artery and bilateral posterior cerebral arteries are patent without proximal hemodynamically significant stenosis.   IMPRESSION: 1. No  evidence of acute intracranial abnormality. 2. No large vessel occlusion or proximal hemodynamically significant stenosis.    MRA HEAD WITHOUT CONTRAST   TECHNIQUE: Multiplanar, multi-echo pulse sequences of the brain and surrounding structures were acquired without intravenous contrast. Angiographic images of the Circle of Willis were acquired using MRA technique without intravenous contrast.   COMPARISON:  None Available.   FINDINGS: MRI HEAD FINDINGS   Brain: No acute infarction, hemorrhage, hydrocephalus, extra-axial collection or mass lesion.   Vascular: See below.   Skull and upper cervical spine: Normal marrow signal.   Sinuses/Orbits: Clear sinuses.  No acute orbital findings.   Other: No mastoid effusions.   MRA HEAD FINDINGS   Anterior circulation: Bilateral intracranial ICAs, MCAs, and ACAs are patent without proximal hemodynamically significant stenosis.   Posterior circulation: Bilateral intradural vertebral arteries, basilar artery and bilateral posterior cerebral arteries are patent without proximal hemodynamically significant stenosis.   IMPRESSION: 1. No evidence of acute intracranial abnormality. 2. No large vessel occlusion or proximal hemodynamically significant stenosis.     10/14/2023    1:09 PM 10/14/2023   10:13 AM 06/15/2023    3:51 PM  Depression screen PHQ 2/9  Decreased Interest 0 0 0  Down, Depressed, Hopeless 0 0 0  PHQ - 2 Score 0 0 0  Altered sleeping 0  0  Tired, decreased energy 0  0  Change in appetite 0  0  Feeling bad or failure about yourself  0  0  Trouble concentrating 0  0  Moving slowly or fidgety/restless 0  0  Suicidal thoughts 0  0  PHQ-9 Score 0  0  Difficult doing work/chores Not difficult at all  Not difficult at all    Relevant past medical, surgical, family and social history reviewed and updated as indicated. Interim medical history since our last visit reviewed. Allergies and medications reviewed and  updated.  Review of Systems  Per HPI unless specifically indicated above     Objective:      BP (!) 144/76   Pulse 89   Resp 18   Ht 5' 5 (1.651 m)   Wt 254 lb 1.6 oz (115.3 kg)  LMP  (LMP Unknown)   SpO2 98%   BMI 42.28 kg/m    Wt Readings from Last 3 Encounters:  10/14/23 254 lb 1.6 oz (115.3 kg)  10/14/23 240 lb (108.9 kg)  09/16/23 241 lb (109.3 kg)    Physical Exam VITALS: BP- 144/76 MEASUREMENTS: Weight- 254. GENERAL: Alert, cooperative, well developed, no acute distress HEENT: Normocephalic, normal oropharynx, moist mucous membranes CHEST: Clear to auscultation bilaterally, no wheezes, rhonchi, or crackles CARDIOVASCULAR: Normal heart rate and rhythm, S1 and S2 normal without murmurs ABDOMEN: Soft, non-tender, non-distended, without organomegaly, normal bowel sounds EXTREMITIES: No cyanosis or edema NEUROLOGICAL: Cranial nerves grossly intact, moves all extremities without gross motor or sensory deficit  Results for orders placed or performed during the hospital encounter of 09/16/23  Comprehensive metabolic panel   Collection Time: 09/16/23  1:31 PM  Result Value Ref Range   Sodium 141 135 - 145 mmol/L   Potassium 3.9 3.5 - 5.1 mmol/L   Chloride 108 98 - 111 mmol/L   CO2 26 22 - 32 mmol/L   Glucose, Bld 243 (H) 70 - 99 mg/dL   BUN 19 6 - 20 mg/dL   Creatinine, Ser 9.00 0.44 - 1.00 mg/dL   Calcium  8.9 8.9 - 10.3 mg/dL   Total Protein 6.3 (L) 6.5 - 8.1 g/dL   Albumin  3.0 (L) 3.5 - 5.0 g/dL   AST 17 15 - 41 U/L   ALT 18 0 - 44 U/L   Alkaline Phosphatase 77 38 - 126 U/L   Total Bilirubin 0.5 0.0 - 1.2 mg/dL   GFR, Estimated >39 >39 mL/min   Anion gap 7 5 - 15  CBC with Differential   Collection Time: 09/16/23  1:31 PM  Result Value Ref Range   WBC 6.2 4.0 - 10.5 K/uL   RBC 3.75 (L) 3.87 - 5.11 MIL/uL   Hemoglobin 10.7 (L) 12.0 - 15.0 g/dL   HCT 65.1 (L) 63.9 - 53.9 %   MCV 92.8 80.0 - 100.0 fL   MCH 28.5 26.0 - 34.0 pg   MCHC 30.7 30.0 - 36.0  g/dL   RDW 85.4 88.4 - 84.4 %   Platelets 244 150 - 400 K/uL   nRBC 0.0 0.0 - 0.2 %   Neutrophils Relative % 59 %   Neutro Abs 3.7 1.7 - 7.7 K/uL   Lymphocytes Relative 30 %   Lymphs Abs 1.9 0.7 - 4.0 K/uL   Monocytes Relative 9 %   Monocytes Absolute 0.6 0.1 - 1.0 K/uL   Eosinophils Relative 1 %   Eosinophils Absolute 0.0 0.0 - 0.5 K/uL   Basophils Relative 1 %   Basophils Absolute 0.0 0.0 - 0.1 K/uL   Immature Granulocytes 0 %   Abs Immature Granulocytes 0.02 0.00 - 0.07 K/uL  Ethanol   Collection Time: 09/16/23  1:31 PM  Result Value Ref Range   Alcohol , Ethyl (B) <15 <15 mg/dL  Troponin I (High Sensitivity)   Collection Time: 09/16/23  1:31 PM  Result Value Ref Range   Troponin I (High Sensitivity) 6 <18 ng/L  Troponin I (High Sensitivity)   Collection Time: 09/16/23  4:01 PM  Result Value Ref Range   Troponin I (High Sensitivity) 6 <18 ng/L  Urine Culture   Collection Time: 09/16/23  7:16 PM   Specimen: Urine, Clean Catch  Result Value Ref Range   Specimen Description      URINE, CLEAN CATCH Performed at [facility], [address], [place], [place] [address]  Special Requests      NONE Performed at [facility], [address] [place], [place] [address]    Culture >=100,000 COLONIES/mL ESCHERICHIA COLI (A)    Report Status 09/18/2023 FINAL    Organism ID, Bacteria ESCHERICHIA COLI (A)       Susceptibility   Escherichia coli - MIC*    AMPICILLIN <=2 SENSITIVE Sensitive     CEFAZOLIN  (URINE) Value in next row Sensitive      <=1 SENSITIVEThis is a modified FDA-approved test that has been validated and its performance characteristics determined by the reporting laboratory.  This laboratory is certified under the Clinical Laboratory Improvement Amendments CLIA as qualified to perform high complexity clinical laboratory testing.    CEFEPIME Value in next row Sensitive      <=1 SENSITIVEThis is a modified FDA-approved test that has been  validated and its performance characteristics determined by the reporting laboratory.  This laboratory is certified under the Clinical Laboratory Improvement Amendments CLIA as qualified to perform high complexity clinical laboratory testing.    ERTAPENEM Value in next row Sensitive      <=1 SENSITIVEThis is a modified FDA-approved test that has been validated and its performance characteristics determined by the reporting laboratory.  This laboratory is certified under the Clinical Laboratory Improvement Amendments CLIA as qualified to perform high complexity clinical laboratory testing.    CEFTRIAXONE  Value in next row Sensitive      <=1 SENSITIVEThis is a modified FDA-approved test that has been validated and its performance characteristics determined by the reporting laboratory.  This laboratory is certified under the Clinical Laboratory Improvement Amendments CLIA as qualified to perform high complexity clinical laboratory testing.    CIPROFLOXACIN  Value in next row Sensitive      <=1 SENSITIVEThis is a modified FDA-approved test that has been validated and its performance characteristics determined by the reporting laboratory.  This laboratory is certified under the Clinical Laboratory Improvement Amendments CLIA as qualified to perform high complexity clinical laboratory testing.    GENTAMICIN Value in next row Sensitive      <=1 SENSITIVEThis is a modified FDA-approved test that has been validated and its performance characteristics determined by the reporting laboratory.  This laboratory is certified under the Clinical Laboratory Improvement Amendments CLIA as qualified to perform high complexity clinical laboratory testing.    NITROFURANTOIN  Value in next row Sensitive      <=1 SENSITIVEThis is a modified FDA-approved test that has been validated and its performance characteristics determined by the reporting laboratory.  This laboratory is certified under the Clinical Laboratory Improvement  Amendments CLIA as qualified to perform high complexity clinical laboratory testing.    TRIMETH /SULFA  Value in next row Sensitive      <=1 SENSITIVEThis is a modified FDA-approved test that has been validated and its performance characteristics determined by the reporting laboratory.  This laboratory is certified under the Clinical Laboratory Improvement Amendments CLIA as qualified to perform high complexity clinical laboratory testing.    AMPICILLIN/SULBACTAM Value in next row Sensitive      <=1 SENSITIVEThis is a modified FDA-approved test that has been validated and its performance characteristics determined by the reporting laboratory.  This laboratory is certified under the Clinical Laboratory Improvement Amendments CLIA as qualified to perform high complexity clinical laboratory testing.    PIP/TAZO Value in next row Sensitive ug/mL     <=4 SENSITIVEThis is a modified FDA-approved test that has been validated and its performance characteristics determined by the reporting laboratory.  This  laboratory is certified under the Clinical Laboratory Improvement Amendments CLIA as qualified to perform high complexity clinical laboratory testing.    MEROPENEM Value in next row Sensitive      <=4 SENSITIVEThis is a modified FDA-approved test that has been validated and its performance characteristics determined by the reporting laboratory.  This laboratory is certified under the Clinical Laboratory Improvement Amendments CLIA as qualified to perform high complexity clinical laboratory testing.    * >=100,000 COLONIES/mL ESCHERICHIA COLI  Urinalysis, Routine w reflex microscopic -Urine, Clean Catch   Collection Time: 09/16/23  7:16 PM  Result Value Ref Range   Color, Urine YELLOW (A) YELLOW   APPearance CLOUDY (A) CLEAR   Specific Gravity, Urine 1.020 1.005 - 1.030   pH 5.0 5.0 - 8.0   Glucose, UA NEGATIVE NEGATIVE mg/dL   Hgb urine dipstick SMALL (A) NEGATIVE   Bilirubin Urine NEGATIVE NEGATIVE    Ketones, ur NEGATIVE NEGATIVE mg/dL   Protein, ur 30 (A) NEGATIVE mg/dL   Nitrite NEGATIVE NEGATIVE   Leukocytes,Ua LARGE (A) NEGATIVE   RBC / HPF 0-5 0 - 5 RBC/hpf   WBC, UA >50 0 - 5 WBC/hpf   Bacteria, UA MANY (A) NONE SEEN   Squamous Epithelial / HPF 6-10 0 - 5 /HPF  Urine Drug Screen, Qualitative   Collection Time: 09/16/23  7:16 PM  Result Value Ref Range   Tricyclic, Ur Screen POSITIVE (A) NONE DETECTED   Amphetamines, Ur Screen NONE DETECTED NONE DETECTED   MDMA (Ecstasy)Ur Screen NONE DETECTED NONE DETECTED   Cocaine Metabolite,Ur [place] NONE DETECTED NONE DETECTED   Opiate, Ur Screen NONE DETECTED NONE DETECTED   Phencyclidine (PCP) Ur S NONE DETECTED NONE DETECTED   Cannabinoid 50 Ng, Ur  NONE DETECTED NONE DETECTED   Barbiturates, Ur Screen POSITIVE (A) NONE DETECTED   Benzodiazepine, Ur Scrn NONE DETECTED NONE DETECTED   Methadone Scn, Ur NONE DETECTED NONE DETECTED  Glucose, capillary   Collection Time: 09/16/23  9:00 PM  Result Value Ref Range   Glucose-Capillary 151 (H) 70 - 99 mg/dL  CBC   Collection Time: 09/16/23  9:50 PM  Result Value Ref Range   WBC 5.9 4.0 - 10.5 K/uL   RBC 4.03 3.87 - 5.11 MIL/uL   Hemoglobin 11.3 (L) 12.0 - 15.0 g/dL   HCT 63.0 63.9 - 53.9 %   MCV 91.6 80.0 - 100.0 fL   MCH 28.0 26.0 - 34.0 pg   MCHC 30.6 30.0 - 36.0 g/dL   RDW 85.5 88.4 - 84.4 %   Platelets 252 150 - 400 K/uL   nRBC 0.0 0.0 - 0.2 %  Creatinine, serum   Collection Time: 09/16/23  9:50 PM  Result Value Ref Range   Creatinine, Ser 0.88 0.44 - 1.00 mg/dL   GFR, Estimated >39 >39 mL/min  Comprehensive metabolic panel   Collection Time: 09/17/23  4:47 AM  Result Value Ref Range   Sodium 140 135 - 145 mmol/L   Potassium 3.8 3.5 - 5.1 mmol/L   Chloride 107 98 - 111 mmol/L   CO2 26 22 - 32 mmol/L   Glucose, Bld 182 (H) 70 - 99 mg/dL   BUN 16 6 - 20 mg/dL   Creatinine, Ser 9.16 0.44 - 1.00 mg/dL   Calcium  8.7 (L) 8.9 - 10.3 mg/dL   Total Protein 6.9 6.5 - 8.1 g/dL    Albumin  3.1 (L) 3.5 - 5.0 g/dL   AST 18 15 - 41 U/L   ALT 17 0 -  44 U/L   Alkaline Phosphatase 79 38 - 126 U/L   Total Bilirubin 0.3 0.0 - 1.2 mg/dL   GFR, Estimated >39 >39 mL/min   Anion gap 7 5 - 15  CBC   Collection Time: 09/17/23  4:47 AM  Result Value Ref Range   WBC 6.1 4.0 - 10.5 K/uL   RBC 3.91 3.87 - 5.11 MIL/uL   Hemoglobin 11.0 (L) 12.0 - 15.0 g/dL   HCT 64.0 (L) 63.9 - 53.9 %   MCV 91.8 80.0 - 100.0 fL   MCH 28.1 26.0 - 34.0 pg   MCHC 30.6 30.0 - 36.0 g/dL   RDW 85.5 88.4 - 84.4 %   Platelets 269 150 - 400 K/uL   nRBC 0.0 0.0 - 0.2 %  Magnesium    Collection Time: 09/17/23  4:47 AM  Result Value Ref Range   Magnesium  2.1 1.7 - 2.4 mg/dL  Phosphorus   Collection Time: 09/17/23  4:47 AM  Result Value Ref Range   Phosphorus 2.8 2.5 - 4.6 mg/dL  Glucose, capillary   Collection Time: 09/17/23  7:53 AM  Result Value Ref Range   Glucose-Capillary 168 (H) 70 - 99 mg/dL   Comment 1 Notify RN    Comment 2 Document in Chart   Glucose, capillary   Collection Time: 09/17/23 11:43 AM  Result Value Ref Range   Glucose-Capillary 195 (H) 70 - 99 mg/dL   Comment 1 Notify RN    Comment 2 Document in Chart   Glucose, capillary   Collection Time: 09/17/23  4:22 PM  Result Value Ref Range   Glucose-Capillary 213 (H) 70 - 99 mg/dL   Comment 1 Notify RN    Comment 2 Document in Chart   Glucose, capillary   Collection Time: 09/17/23  8:17 PM  Result Value Ref Range   Glucose-Capillary 247 (H) 70 - 99 mg/dL   Comment 1 Document in Chart   Glucose, capillary   Collection Time: 09/18/23  7:39 AM  Result Value Ref Range   Glucose-Capillary 211 (H) 70 - 99 mg/dL   Comment 1 Notify RN    Comment 2 Document in Chart   Glucose, capillary   Collection Time: 09/18/23 12:03 PM  Result Value Ref Range   Glucose-Capillary 202 (H) 70 - 99 mg/dL   Comment 1 Notify RN    Comment 2 Document in Chart    *Note: Due to a large number of results and/or encounters for the requested time  period, some results have not been displayed. A complete set of results can be found in Results Review.          Assessment & Plan:   Problem List Items Addressed This Visit       Cardiovascular and Mediastinum   Essential hypertension, benign   Relevant Medications   amLODipine  (NORVASC ) 10 MG tablet   ezetimibe  (ZETIA ) 10 MG tablet   furosemide  (LASIX ) 20 MG tablet   Chronic heart failure with preserved ejection fraction (HFpEF) (HCC)   Relevant Medications   amLODipine  (NORVASC ) 10 MG tablet   ezetimibe  (ZETIA ) 10 MG tablet   furosemide  (LASIX ) 20 MG tablet     Respiratory   OSA (obstructive sleep apnea)     Digestive   Gastro-esophageal reflux disease without esophagitis     Endocrine   Dyslipidemia associated with type 2 diabetes mellitus (HCC)   Relevant Medications   ezetimibe  (ZETIA ) 10 MG tablet   Type 2 diabetes mellitus with hyperglycemia (HCC)   Relevant  Orders   Microalbumin / creatinine urine ratio   Ambulatory referral to Ophthalmology     Nervous and Auditory   Hemiparesis affecting left side as late effect of stroke (HCC)   Epilepsy idiopathic petit mal (HCC)     Genitourinary   Overactive bladder   Relevant Medications   mirabegron  ER (MYRBETRIQ ) 50 MG TB24 tablet   UTI (urinary tract infection)     Other   Mixed hyperlipidemia   Relevant Medications   amLODipine  (NORVASC ) 10 MG tablet   ezetimibe  (ZETIA ) 10 MG tablet   furosemide  (LASIX ) 20 MG tablet   Mixed urge and stress incontinence   Chronic pain syndrome   Relevant Medications   DULoxetine  (CYMBALTA ) 60 MG capsule   History of CVA (cerebrovascular accident)   Mild episode of recurrent major depressive disorder   Relevant Medications   DULoxetine  (CYMBALTA ) 60 MG capsule   Obesity, Class II, BMI 35-39.9   Other Visit Diagnoses       Slurred speech    -  Primary     Encounter for screening mammogram for malignant neoplasm of breast       Relevant Orders   MM 3D SCREENING  MAMMOGRAM BILATERAL BREAST     Diabetic polyneuropathy associated with type 2 diabetes mellitus (HCC)       Relevant Medications   DULoxetine  (CYMBALTA ) 60 MG capsule     Immunization due       Relevant Orders   Flu vaccine trivalent PF, 6mos and older(Flulaval,Afluria,Fluarix,Fluzone) (Completed)        Assessment and Plan Assessment & Plan Acute Cystitis due to E. coli Recent hospitalization for acute cystitis treated with cefotaxime. Slurred speech and weakness prompted stroke evaluation; MRI brain was negative for stroke. - Continue physical therapy and occupational therapy at home - Ensure recertification for therapy services post-hospitalization  Hemiparesis, left side, following prior stroke Left-sided hemiparesis following prior stroke, managed with physical therapy and occupational therapy.  Type 2 Diabetes Mellitus with Hyperglycemia Type 2 diabetes with recent A1c of 9.9% on August 27, 2023. Blood sugar levels are currently well-managed with Trulicity , Toujeo , Humalog , and Freestyle Libre for monitoring. - Continue current diabetes medications and monitoring regimen - Follow up with endocrinology on October 21, 2023  Heart Failure Heart failure managed with oral Lasix .  Coronary Artery Disease Coronary artery disease managed with statin and Plavix .  Hypertension Hypertension with current blood pressure reading of 144/76 mmHg, managed with amlodipine , Coreg , hydralazine , and Lasix .  Mixed Hyperlipidemia Mixed hyperlipidemia managed with Zetia  and Crestor .  Chronic Obstructive Pulmonary Disease with Asthma COPD with asthma managed with albuterol  nebulizer, Advair Discus, and albuterol  inhaler. Recently seen by pulmonology with no changes in management.  Obstructive Sleep Apnea Obstructive sleep apnea managed with CPAP.  Obesity Obesity with current weight of 254 pounds. Lifestyle modifications encouraged.  Chronic Pain Syndrome Chronic pain syndrome managed by  pain management clinic with amitriptyline , gabapentin , Percocet, and tizanidine .  Epilepsy Epilepsy managed with Keppra . Last neurology visit was on January 26, 2023. - Recommend scheduling an appointment with neurology  Gastroesophageal Reflux Disease (GERD) GERD managed with pantoprazole  as needed.  Urinary Incontinence Urinary incontinence managed with Labetriq.  General Health Maintenance Flu vaccination is due. Pneumonia vaccination is up to date as of 2021. - Administer flu shot during this visit  Goals of Care Considering transition to assisted living due to frequent falls and better management of health conditions. - Discuss potential assisted living options with family  Follow up plan: Return in about 4 months (around 02/13/2024) for follow up.

## 2023-10-14 NOTE — Progress Notes (Signed)
 Nursing Pain Medication Assessment:  Safety precautions to be maintained throughout the outpatient stay will include: orient to surroundings, keep bed in low position, maintain call bell within reach at all times, provide assistance with transfer out of bed and ambulation.  Medication Inspection Compliance: Ms. Gathright did not comply with our request to bring her pills to be counted. She was reminded that bringing the medication bottles, even when empty, is a requirement.  Medication: None brought in. Pill/Patch Count: None available to be counted. Bottle Appearance: No container available. Did not bring bottle(s) to appointment. Filled Date: N/A Last Medication intake:  Saturday

## 2023-10-14 NOTE — Progress Notes (Signed)
 Patient: [name]  Service: E/M   PCP: [name], [name], [name]  DOB: 1965-05-26  DOS: 10/14/2023  Provider: Emmy MARLA Blanch, NP  MRN: [id number]  Delivery: Face-to-face  Specialty: Interventional Pain Management  Type: Established Patient  Setting: Ambulatory outpatient facility  Specialty designation: 09  Referring Prov.: [name], [name]  Location: Outpatient office facility       History of present illness (HPI) [name], a 58 y.o. year old female, is here today because of her Chronic pain syndrome [G89.4]. [name] primary complain today is Shoulder Pain (Pain radiating down left side of body down to her left foot ) after fall.   Pertinent problems: [name] has Obesity, Class III, BMI 40-49.9 (morbid obesity) (HCC); Dyslipidemia associated with type 2 diabetes mellitus (HCC); Chronic pain syndrome; Diabetic polyneuropathy associated with type 2 diabetes mellitus (HCC); and CAD (coronary artery disease) on their pertinent problem list.  Pain Assessment: Severity of Chronic pain is reported as a 10-Worst pain ever/10. Location: Shoulder Left/Left side of body down to left foot. Onset: More than a month ago. Quality: Aching. Timing: Constant. Modifying factor(s): Nothing. Vitals:  height is 5' 5 (1.651 m) and weight is 240 lb (108.9 kg). Her temporal temperature is 97.9 F (36.6 C). Her blood pressure is 156/83 (abnormal) and her pulse is 86. Her respiration is 18.  BMI: Estimated body mass index is 39.94 kg/m as calculated from the following:   Height as of this encounter: 5' 5 (1.651 m).   Weight as of this encounter: 240 lb (108.9 kg).  Last encounter: Visit date not found. Last procedure: Visit date not found.  Reason for encounter: medication management. No change in medical history since last visit.  Patient's pain is at baseline.  Patient continues multimodal pain regimen as prescribed.  States that it provides pain relief and improvement in functional  status.  The patient reports that the Butrans  patch does not adequately control her pain level.  She experienced a fall resulting in a broken foot and was hospitalized in August 2025, followed by 3 to 4 days of rehabilitation.  She was readmitted from 09/16/2023 to 09/18/2023 due to a urinary tract infection.  She describes her pain as unbearable, which is not relieved by Butrans  patch.  We discussed alternative pain management options, include Percocet and oxycodone .  The plan is to initiate Percocet 5-325 mg every 8 hours as needed for pain and to discontinue to Butrans  patch. Her blood pressure is elevated (156/83), likely secondary to pain from her broken foot.  She is currently taking amlodipine  for hypertension. Pharmacotherapy Assessment   Started oxycodone -acetaminophen  (Percocet) 5-325 mg tablet every 8 hours as needed for shoulder and low back pain.  Discontinue Butrans  7.5 mcg/h patch Monitoring: [place] PMP: PDMP reviewed during this encounter.       Pharmacotherapy: No side-effects or adverse reactions reported. Compliance: No problems identified. Effectiveness: Clinically acceptable.  Karen [name], [place]  10/14/2023 10:16 AM  Sign when Signing Visit Nursing Pain Medication Assessment:  Safety precautions to be maintained throughout the outpatient stay will include: orient to surroundings, keep bed in low position, maintain call bell within reach at all times, provide assistance with transfer out of bed and ambulation.  Medication Inspection Compliance: [name] did not comply with our request to bring her pills to be counted. She was reminded that bringing the medication bottles, even when empty, is a requirement.  Medication: None brought in. Pill/Patch Count: None available to be  counted. Bottle Appearance: No container available. Did not bring bottle(s) to appointment. Filled Date: N/A Last Medication intake:  Saturday    UDS:  Summary  Date Value Ref Range Status  12/06/2018  Note  Final    Comment:    ==================================================================== Compliance Drug Analysis, Ur ==================================================================== Test                             Result       Flag       Units Drug Present and Declared for Prescription Verification   Oxycodone                       62           EXPECTED   ng/mg creat   Oxymorphone                    73           EXPECTED   ng/mg creat   Noroxycodone                   322          EXPECTED   ng/mg creat   Noroxymorphone                 43           EXPECTED   ng/mg creat    Sources of oxycodone  are scheduled prescription medications.    Oxymorphone, noroxycodone, and noroxymorphone are expected    metabolites of oxycodone . Oxymorphone is also available as a    scheduled prescription medication.   Gabapentin                      PRESENT      EXPECTED   Levetiracetam                   PRESENT      EXPECTED   Baclofen                        PRESENT      EXPECTED   Acetaminophen                   PRESENT      EXPECTED Drug Present not Declared for Prescription Verification   Morphine                        227          UNEXPECTED ng/mg creat    Potential sources of morphine  include administration of codeine or    morphine , use of heroin, or ingestion of poppy seeds. Drug Absent but Declared for Prescription Verification   Butalbital                      Not Detected UNEXPECTED   Duloxetine                      Not Detected UNEXPECTED   Prochlorperazine                Not Detected UNEXPECTED ==================================================================== Test                      Result    Flag   Units      Ref Range   Creatinine  157              mg/dL      >=79 ==================================================================== Declared Medications:  The flagging and interpretation on this report are based on the  following declared medications.  Unexpected results  may arise from  inaccuracies in the declared medications.  **Note: The testing scope of this panel includes these medications:  Baclofen   Butalbital  (Fioricet )  Duloxetine  (Cymbalta )  Gabapentin  (Neurontin )  Levetiracetam  (Keppra )  Oxycodone  (Percocet)  Prochlorperazine  (Compazine )  **Note: The testing scope of this panel does not include small to  moderate amounts of these reported medications:  Acetaminophen  (Fioricet )  Acetaminophen  (Percocet)  **Note: The testing scope of this panel does not include the  following reported medications:  Albuterol  (Ventolin  HFA)  Amlodipine   Caffeine  (Fioricet )  Calcium   Cimetidine  (Tagamet )  Clopidogrel  (Plavix )  Cyclosporine  (Restasis )  Dulaglutide   Ezetimibe  (Zetia )  Fluorometholone  Fluticasone  (Advair)  Indeterminate Medication  Insulin  (NovoLog )  Losartan  (Cozaar )  Mirabegron  (Myrbetriq )  Ondansetron  (Zofran )  Pantoprazole  (Protonix )  Polyethylene Glycol (MiraLAX )  Potassium (Klor-Con )  Prednisone  (Deltasone )  Salmeterol (Advair)  Simvastatin  (Zocor )  Sucralfate  (Carafate )  Sulfamethoxazole  (Bactrim )  Terconazole   Trimethoprim  (Bactrim )  Vitamin D  ==================================================================== For clinical consultation, please call [phone]. ====================================================================     No results found for: CBDTHCR No results found for: D8THCCBX No results found for: D9THCCBX  ROS  Constitutional: Denies any fever or chills Gastrointestinal: No reported hemesis, hematochezia, vomiting, or acute GI distress Musculoskeletal: Shoulder Pain (Pain radiating down left side of body down to her left foot ) Neurological: No reported episodes of acute onset apraxia, aphasia, dysarthria, agnosia, amnesia, paralysis, loss of coordination, or loss of consciousness  Medication Review  DULoxetine , Dulaglutide , EPINEPHrine , FreeStyle Libre 3 Plus Sensor, FreeStyle Libre  3 Sensor, Glucagon , Pen Needles, Ubrogepant , albuterol , amLODipine , amitriptyline , butalbital -acetaminophen -caffeine , carvedilol , cholecalciferol , cloNIDine , clopidogrel , cycloSPORINE , ezetimibe , fluticasone -salmeterol, furosemide , gabapentin , glucose blood, hydrALAZINE , insulin  glargine (2 Unit Dial ), insulin  lispro, levETIRAcetam , meclizine , mirabegron  ER, nitroGLYCERIN , olmesartan , oxyCODONE -acetaminophen , pantoprazole , potassium chloride  Calderon, rosuvastatin , and tiZANidine   History Review  Allergy: [name] is allergic to codeine, farxiga  [dapagliflozin ], and lactose intolerance (gi). Drug: [name]  reports no history of drug use. Alcohol :  reports that she does not currently use alcohol  after a past usage of about 1.0 standard drink of alcohol  per week. Tobacco:  reports that she quit smoking about 36 years ago. Her smoking use included cigarettes. She started smoking about 51 years ago. She has a 15 pack-year smoking history. She has never used smokeless tobacco. Social: [name]  reports that she quit smoking about 36 years ago. Her smoking use included cigarettes. She started smoking about 51 years ago. She has a 15 pack-year smoking history. She has never used smokeless tobacco. She reports that she does not currently use alcohol  after a past usage of about 1.0 standard drink of alcohol  per week. She reports that she does not use drugs. Medical:  has a past medical history of Anxiety and depression (09/13/2006), Arthritis, Asthma, CHF (congestive heart failure) (HCC), COPD (chronic obstructive pulmonary disease) (HCC), Depression, Diabetes mellitus, Diabetic neuropathy, painful (HCC), Diabetic retinopathy (HCC), Diastolic dysfunction, Dilated aortic root, Dizziness, Epilepsy idiopathic petit mal (HCC) (09/13/2006), GERD (gastroesophageal reflux disease), Glaucoma, Headache(784.0), Heart murmur, History of stress test, blood clots, Hyperlipidemia, Hypertension, Hypertensive urgency (04/23/2021),  Legally blind, MIGRAINE HEADACHE (09/14/2006), Myocardial infarction [facility]), Nephrolithiasis, Pain (09/23/2011), Pneumonia, Pyelonephritis, SBO (small bowel obstruction) (HCC) (04/23/2021), Seizures (HCC), Sleep apnea, Small bowel obstruction (HCC) (05/18/2021), Stress incontinence, Stroke (HCC), and Syncope (  07/07/2021). Surgical: [name]  has a past surgical history that includes kidney stone removal; Brain hematoma evacuation; Shunt removal; Retinal detachment surgery (Left, 1990); Vaginal hysterectomy (1996); Incisional hernia repair (05/05/2011); Mass excision (05/05/2011); PCNL; Cystoscopy/retrograde/ureteroscopy (08/24/2011); Cystoscopy with ureteroscopy (09/24/2011); Cystoscopy w/ retrogrades (09/24/2011); RIGHT/LEFT HEART CATH AND CORONARY ANGIOGRAPHY (N/A, 04/28/2019); and laparotomy (N/A, 06/12/2021). Family: family history includes ADD / ADHD in her maternal grandfather; Birth defects in her daughter and maternal grandfather; Brain cancer in her maternal grandmother; Cancer in her mother; Cirrhosis in her maternal grandfather; Diabetes in her maternal grandfather; Hypertension in her daughter, maternal grandmother, and mother; Uterine cancer in her mother.  Laboratory Chemistry Profile   Renal Lab Results  Component Value Date   BUN 16 09/17/2023   CREATININE 0.83 09/17/2023   LABCREA 25 11/17/2021   BCR SEE NOTE: 08/27/2023   GFR 74.92 04/14/2022   GFRAA >60 05/30/2019   GFRNONAA >60 09/17/2023    Hepatic Lab Results  Component Value Date   AST 18 09/17/2023   ALT 17 09/17/2023   ALBUMIN  3.1 (L) 09/17/2023   ALKPHOS 79 09/17/2023   LIPASE 45 06/12/2023    Electrolytes Lab Results  Component Value Date   NA 140 09/17/2023   K 3.8 09/17/2023   CL 107 09/17/2023   CALCIUM  8.7 (L) 09/17/2023   MG 2.1 09/17/2023   PHOS 2.8 09/17/2023    Bone Lab Results  Component Value Date   VD25OH 33 11/17/2021    Inflammation (CRP: Acute Phase) (ESR: Chronic Phase) Lab Results  Component  Value Date   CRP 16.7 (H) 06/14/2021   ESRSEDRATE 65 (H) 06/14/2021   LATICACIDVEN 1.6 08/03/2022         Note: Above Lab results reviewed.  Recent Imaging Review  MR ANGIO HEAD WO CONTRAST CLINICAL DATA:  Neuro deficit, acute, stroke suspected  EXAM: MRI HEAD WITHOUT CONTRAST  MRA HEAD WITHOUT CONTRAST  TECHNIQUE: Multiplanar, multi-echo pulse sequences of the brain and surrounding structures were acquired without intravenous contrast. Angiographic images of the Circle of Willis were acquired using MRA technique without intravenous contrast.  COMPARISON:  None Available.  FINDINGS: MRI HEAD FINDINGS  Brain: No acute infarction, hemorrhage, hydrocephalus, extra-axial collection or mass lesion.  Vascular: See below.  Skull and upper cervical spine: Normal marrow signal.  Sinuses/Orbits: Clear sinuses.  No acute orbital findings.  Other: No mastoid effusions.  MRA HEAD FINDINGS  Anterior circulation: Bilateral intracranial ICAs, MCAs, and ACAs are patent without proximal hemodynamically significant stenosis.  Posterior circulation: Bilateral intradural vertebral arteries, basilar artery and bilateral posterior cerebral arteries are patent without proximal hemodynamically significant stenosis.  IMPRESSION: 1. No evidence of acute intracranial abnormality. 2. No large vessel occlusion or proximal hemodynamically significant stenosis.  Electronically Signed   By: Gilmore GORMAN Molt M.D.   On: 09/16/2023 16:53 MR BRAIN WO CONTRAST CLINICAL DATA:  Neuro deficit, acute, stroke suspected  EXAM: MRI HEAD WITHOUT CONTRAST  MRA HEAD WITHOUT CONTRAST  TECHNIQUE: Multiplanar, multi-echo pulse sequences of the brain and surrounding structures were acquired without intravenous contrast. Angiographic images of the Circle of Willis were acquired using MRA technique without intravenous contrast.  COMPARISON:  None Available.  FINDINGS: MRI HEAD FINDINGS  Brain:  No acute infarction, hemorrhage, hydrocephalus, extra-axial collection or mass lesion.  Vascular: See below.  Skull and upper cervical spine: Normal marrow signal.  Sinuses/Orbits: Clear sinuses.  No acute orbital findings.  Other: No mastoid effusions.  MRA HEAD FINDINGS  Anterior circulation: Bilateral intracranial ICAs, MCAs, and  ACAs are patent without proximal hemodynamically significant stenosis.  Posterior circulation: Bilateral intradural vertebral arteries, basilar artery and bilateral posterior cerebral arteries are patent without proximal hemodynamically significant stenosis.  IMPRESSION: 1. No evidence of acute intracranial abnormality. 2. No large vessel occlusion or proximal hemodynamically significant stenosis.  Electronically Signed   By: Gilmore GORMAN Molt M.D.   On: 09/16/2023 16:53 CT Head Wo Contrast EXAM: CT HEAD WITHOUT CONTRAST 09/16/2023 03:14:28 PM  TECHNIQUE: CT of the head was performed without the administration of intravenous contrast. Automated exposure control, iterative reconstruction, and/or weight based adjustment of the mA/kV was utilized to reduce the radiation dose to as low as reasonably achievable.  COMPARISON: Head CT 07/25/2023 and MRI 06/10/2022.  CLINICAL HISTORY: Neuro deficit, acute, stroke suspected. Per EMS patient last know well at 2300 on 09/15/23; at 1030 today family noticed slurred speech. 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 Patient is on Plavix .  FINDINGS:  BRAIN AND VENTRICLES: No acute hemorrhage. Gray-white differentiation is preserved. No hydrocephalus. No extra-axial collection. No mass effect or midline shift. Mild cerebral atrophy. Mild cerebral white matter hypodensities, similar to the prior CT and nonspecific but compatible with chronic small vessel ischemic disease. Prominent dural calcifications.  ORBITS: No acute abnormality.  SINUSES: No acute abnormality.  SOFT  TISSUES AND SKULL: No acute soft tissue abnormality. No skull fracture.  IMPRESSION: 1. No acute intracranial abnormality. 2. Mild cerebral atrophy and chronic small vessel ischemic disease.  Electronically signed by: Dasie Hamburg MD 09/16/2023 03:23 PM EDT RP Workstation: [id number] Note: Reviewed        Physical Exam  Vitals: BP (!) 156/83 (BP Location: Right Arm, Patient Position: Sitting)   Pulse 86   Temp 97.9 F (36.6 C) (Temporal)   Resp 18   Ht 5' 5 (1.651 m)   Wt 240 lb (108.9 kg)   LMP  (LMP Unknown)   BMI 39.94 kg/m  BMI: Estimated body mass index is 39.94 kg/m as calculated from the following:   Height as of this encounter: 5' 5 (1.651 m).   Weight as of this encounter: 240 lb (108.9 kg). Ideal: Ideal body weight: 57 kg (125 lb 10.6 oz) Adjusted ideal body weight: 77.7 kg (171 lb 6.4 oz) General appearance: Well nourished, well developed, and well hydrated. In no apparent acute distress Mental status: Alert, oriented x 3 (person, place, & time)       Respiratory: No evidence of acute respiratory distress Eyes: PERLA  Musculoskeletal: + Left shoulder pain radiate down to left side of hand and left side of foot.  Aggravating with weightbearing and overhead reaching.  Assessment   Diagnosis Status  1. Chronic pain syndrome   2. Acute shoulder pain due to trauma, left   3. Bilateral hip pain   4. Medication management   5. Diabetic polyneuropathy associated with type 2 diabetes mellitus (HCC)   6. Pain management contract discussed   7. Pain management contract signed    Having a Flare-up Having a Flare-up Stable   Updated Problems:  Plan of Care  Problem-specific:  Assessment and Plan  Discontinue Butrans  7.5 mcg patch Started on Percocet 5-325 mg tablet every 8 hours as needed for severe pain.  Acute left shoulder pain due to trauma: The patient reports acute left shoulder pain following a fall in which she also sustained a foot fracture.  She was  hospitalized in August 2025 and completed rehabilitation; however, she continues to experience significant shoulder pain that is not relieved  with Butrans  patch.  We discussed transitioning to an alternative pain management regimen with Percocet 5-325 mg for better pain control.  In addition, we discussed administering Toradol  and Robaxin  IM injections given her pain level of 10 out of 10.  I also reviewed her lab results from September 17, 2023, which showed an elevated CRP.  A CT scan of the head performed after the fall revealed no acute intracranial abnormality.   Routine UDS ordered today.  Prescribing drug monitoring (PDMP) reviewed.  Schedule follow-up in 30 days for medication management with Dimond Crotty, NP.  Ms. Bonne Vasha Calderon has a current medication list which includes the following long-term medication(s): albuterol , albuterol , amitriptyline , amlodipine , duloxetine , ezetimibe , furosemide , gabapentin , toujeo  max solostar, insulin  lispro, levetiracetam , myrbetriq , pantoprazole , rosuvastatin , and wixela inhub.  Pharmacotherapy (Medications Ordered): Meds ordered this encounter  Medications   oxyCODONE -acetaminophen  (PERCOCET) 5-325 MG tablet    Sig: Take 1 tablet by mouth every 8 (eight) hours as needed for severe pain (pain score 7-10). Must last 7 days.    Dispense:  90 tablet    Refill:  0   ketorolac  (TORADOL ) injection 60 mg   methocarbamol  (ROBAXIN ) injection 200 mg   Orders:  Orders Placed This Encounter  Procedures   ToxASSURE Select 13 (MW), Urine    Volume: 30 ml(s). Minimum 3 ml of urine is needed. Document temperature of fresh sample. Indications: Long term (current) use of opiate analgesic (S20.108)    Release to patient:   Immediate        Return in about 1 month (around 11/13/2023) for (F2F), (MM), Emmy Blanch NP.    Recent Visits No visits were found meeting these conditions. Showing recent visits within past 90 days and meeting all other  requirements Today's Visits Date Type Provider Dept  10/14/23 Office Visit Ethelean Colla K, NP [facility]  Showing today's visits and meeting all other requirements Future Appointments Date Type Provider Dept  11/11/23 Appointment Amar Sippel K, NP [facility]  Showing future appointments within next 90 days and meeting all other requirements  I discussed the assessment and treatment plan with the patient. The patient was provided an opportunity to ask questions and all were answered. The patient agreed with the plan and demonstrated an understanding of the instructions.  Patient advised to call back or seek an in-person evaluation if the symptoms or condition worsens.  I personally spent a total of 30 minutes in the care of the patient today including preparing to see the patient, getting/reviewing separately obtained history, performing a medically appropriate exam/evaluation, counseling and educating, placing orders, referring and communicating with other health care professionals, documenting clinical information in the EHR, independently interpreting results, communicating results, and coordinating care.   Note by: Emmy MARLA Blanch, NP  Date: 10/14/2023; Time: 1:01 PM

## 2023-10-15 NOTE — Telephone Encounter (Signed)
 Copied from CRM [id number]. Topic: Referral - Question >> Oct 15, 2023  1:33 PM [name] wrote: Reason for CRM: Patient was given a referral to Ophthalmology for a diabetic eye exam and for glasses. States [facility] does not accept her insurance for the both the exam and her glasses.  Would like a new referral sent to =:  [facility]  [address] [place], [place] [address] [phone]  Patient can be reached at [phone]

## 2023-10-18 ENCOUNTER — Ambulatory Visit

## 2023-10-18 VITALS — BP 142/88

## 2023-10-18 DIAGNOSIS — E1122 Type 2 diabetes mellitus with diabetic chronic kidney disease: Secondary | ICD-10-CM | POA: Diagnosis not present

## 2023-10-18 DIAGNOSIS — K219 Gastro-esophageal reflux disease without esophagitis: Secondary | ICD-10-CM | POA: Diagnosis not present

## 2023-10-18 DIAGNOSIS — Z7985 Long-term (current) use of injectable non-insulin antidiabetic drugs: Secondary | ICD-10-CM | POA: Diagnosis not present

## 2023-10-18 DIAGNOSIS — I10 Essential (primary) hypertension: Secondary | ICD-10-CM | POA: Diagnosis not present

## 2023-10-18 DIAGNOSIS — N3281 Overactive bladder: Secondary | ICD-10-CM | POA: Diagnosis not present

## 2023-10-18 DIAGNOSIS — J449 Chronic obstructive pulmonary disease, unspecified: Secondary | ICD-10-CM | POA: Diagnosis not present

## 2023-10-18 DIAGNOSIS — I69354 Hemiplegia and hemiparesis following cerebral infarction affecting left non-dominant side: Secondary | ICD-10-CM | POA: Diagnosis not present

## 2023-10-18 DIAGNOSIS — M778 Other enthesopathies, not elsewhere classified: Secondary | ICD-10-CM | POA: Diagnosis not present

## 2023-10-18 DIAGNOSIS — G4700 Insomnia, unspecified: Secondary | ICD-10-CM | POA: Diagnosis not present

## 2023-10-18 DIAGNOSIS — M62838 Other muscle spasm: Secondary | ICD-10-CM | POA: Diagnosis not present

## 2023-10-18 DIAGNOSIS — Z794 Long term (current) use of insulin: Secondary | ICD-10-CM | POA: Diagnosis not present

## 2023-10-18 DIAGNOSIS — E1142 Type 2 diabetes mellitus with diabetic polyneuropathy: Secondary | ICD-10-CM | POA: Diagnosis not present

## 2023-10-18 DIAGNOSIS — I2510 Atherosclerotic heart disease of native coronary artery without angina pectoris: Secondary | ICD-10-CM | POA: Diagnosis not present

## 2023-10-18 DIAGNOSIS — I5042 Chronic combined systolic (congestive) and diastolic (congestive) heart failure: Secondary | ICD-10-CM | POA: Diagnosis not present

## 2023-10-18 DIAGNOSIS — R4701 Aphasia: Secondary | ICD-10-CM | POA: Diagnosis not present

## 2023-10-18 DIAGNOSIS — E1165 Type 2 diabetes mellitus with hyperglycemia: Secondary | ICD-10-CM | POA: Diagnosis not present

## 2023-10-18 DIAGNOSIS — E46 Unspecified protein-calorie malnutrition: Secondary | ICD-10-CM | POA: Diagnosis not present

## 2023-10-18 DIAGNOSIS — G4733 Obstructive sleep apnea (adult) (pediatric): Secondary | ICD-10-CM | POA: Diagnosis not present

## 2023-10-18 DIAGNOSIS — N181 Chronic kidney disease, stage 1: Secondary | ICD-10-CM | POA: Diagnosis not present

## 2023-10-18 DIAGNOSIS — H547 Unspecified visual loss: Secondary | ICD-10-CM | POA: Diagnosis not present

## 2023-10-18 DIAGNOSIS — E782 Mixed hyperlipidemia: Secondary | ICD-10-CM | POA: Diagnosis not present

## 2023-10-18 DIAGNOSIS — G43909 Migraine, unspecified, not intractable, without status migrainosus: Secondary | ICD-10-CM | POA: Diagnosis not present

## 2023-10-18 DIAGNOSIS — I130 Hypertensive heart and chronic kidney disease with heart failure and stage 1 through stage 4 chronic kidney disease, or unspecified chronic kidney disease: Secondary | ICD-10-CM | POA: Diagnosis not present

## 2023-10-18 DIAGNOSIS — S92352D Displaced fracture of fifth metatarsal bone, left foot, subsequent encounter for fracture with routine healing: Secondary | ICD-10-CM | POA: Diagnosis not present

## 2023-10-18 NOTE — Progress Notes (Unsigned)
 S:     Chief Complaint  Patient presents with   Medication Management    Hypertension and Diabetes    Reason for visit: ?  [name] is a 58 y.o. female with a history of diabetes (type 2), who presents today for a follow up diabetes pharmacotherapy visit.? Pertinent PMH also includes HTN, PAD, CAD, GERD, CVA, HLD, obesity, seizures, MDD, and HFpEF.  Known DM Complications: nephropathy, peripheral neuropathy, and retinopathy   Care Team: Primary Care Provider: [name], [name]  At last visit with endocrinology on 06/15/23, patient was instructed to increase Toujeo  from 33 units to 50 units once daily. Humalog  was increased to 15 units TID with meals. Trulicity  was increased from 3 mg to 4.5 mg weekly. Patient has had to cancel 3 visits with endocrinology since that time.   Today, she reports that her CGM has been falling off and she has not been wearing it for about a month. She has been checking BG at home via fingerstick and reports a range of 150-300 mg/dL. She reports hypoglycemic sx if her BG drops below 150 mg/dL.   Current diabetes medications include: Humalog  (15 units TID + correction), Toujeo  80 units daily, Trulicity  4.5 mg weekly (Saturdays) Previous diabetes medications include: glipizide , metformin, Omnipod, Farxiga  (recurrent UTI) Current hyperlipidemia medications include: rosuvastatin  40 mg daily, ezetimibe  10 mg daily   Patient reports adherence to taking all medications as prescribed.   Have you been experiencing any side effects to the medications prescribed? no Do you have any problems obtaining medications due to transportation or finances? no Insurance coverage: [facility] Medicare  Patient denies hypoglycemic events.  Reported home fasting blood sugars: 150-300 mg/dL *has not been wearing CGM for some time  Hypertension: Current medications: olmesartan  40 mg daily, amlodipine  10 mg daily, carvedilol  25 mg BID, furosemide  20 mg daily, hydralazine   50 mg TID, and clonidine  0.1 mg daily  Medications previously tried: spironolactone  and hydrochlorothiazide  (d/c'ed with hypotension in the past)  Patient has a validated, automated, upper arm home BP cuff Current blood pressure readings readings: 130-120/80-90s *Patient also stated the DBP is frequently >100 mmHg, but was unable to provide a frequency. -Takes clonidine  about two times per week when BP is >160/90 mmHg  Patient denies hypotensive s/sx including dizziness, lightheadedness.  Patient denies hypertensive symptoms including chest pain  Patient endorses headaches and shortness of breath   DM Prevention:  Statin: Taking; high intensity.?  History of albuminuria? yes, last UACR on 11/17/21 = 36 mg/g Last eye exam:  Lab Results  Component Value Date   HMDIABEYEEXA No Retinopathy 09/17/2021   Tobacco Use:  Tobacco Use: Medium Risk (10/14/2023)   Patient History    Smoking Tobacco Use: Former    Smokeless Tobacco Use: Never    Passive Exposure: Not on file   O:  Vitals:  Wt Readings from Last 3 Encounters:  10/14/23 254 lb 1.6 oz (115.3 kg)  10/14/23 240 lb (108.9 kg)  09/16/23 241 lb (109.3 kg)   BP Readings from Last 3 Encounters:  10/18/23 (!) 142/88  10/14/23 (!) 144/76  10/14/23 (!) 156/83   Pulse Readings from Last 3 Encounters:  10/14/23 89  10/14/23 86  09/18/23 81     Labs:?  Lab Results  Component Value Date   HGBA1C 9.9 (H) 08/27/2023   HGBA1C 11.0 (A) 02/04/2023   HGBA1C 9.7 (H) 07/20/2022   GLUCOSE 182 (H) 09/17/2023   MICRALBCREAT 36 (H) 11/17/2021   MICRALBCREAT 202 (H) 01/24/2018  MICRALBCREAT 9 11/11/2015   CREATININE 0.83 09/17/2023   CREATININE 0.88 09/16/2023   CREATININE 0.99 09/16/2023   GFR 74.92 04/14/2022   GFR 76.80 09/30/2020   GFR 52.45 (L) 06/10/2020    Lab Results  Component Value Date   CHOL 129 02/16/2023   LDLCALC 60 02/16/2023   LDLCALC 67 06/02/2021   LDLCALC 93 06/10/2020   HDL 45 (L) 02/16/2023   TRIG 166  (H) 02/16/2023   TRIG 71 06/19/2021   TRIG 126 06/16/2021   ALT 17 09/17/2023   ALT 18 09/16/2023   AST 18 09/17/2023   AST 17 09/16/2023      Chemistry      Component Value Date/Time   NA 140 09/17/2023 0447   NA 138 05/08/2015 1106   K 3.8 09/17/2023 0447   CL 107 09/17/2023 0447   CO2 26 09/17/2023 0447   BUN 16 09/17/2023 0447   BUN 14 05/08/2015 1106   CREATININE 0.83 09/17/2023 0447   CREATININE 0.90 08/27/2023 1451      Component Value Date/Time   CALCIUM  8.7 (L) 09/17/2023 0447   ALKPHOS 79 09/17/2023 0447   AST 18 09/17/2023 0447   ALT 17 09/17/2023 0447   BILITOT 0.3 09/17/2023 0447   BILITOT 0.3 05/08/2015 1106       The ASCVD Risk score (Arnett DK, et al., 2019) failed to calculate for the following reasons:   Risk score cannot be calculated because patient has a medical history suggesting prior/existing ASCVD  Lab Results  Component Value Date   MICRALBCREAT 36 (H) 11/17/2021   MICRALBCREAT 202 (H) 01/24/2018   MICRALBCREAT 9 11/11/2015   MICRALBCREAT 21.7 05/23/2013    A/P: Hypertension longstanding currently uncontrolled with reported use of PRN clonidine  2x per week when BP is >160/90 mmHg. Blood pressure goal of <130/80 mmHg. Medication adherence appropriate. Will add on a thiazide agent in combination with her current RAAS agent. Could consider combining with her CCB in the past for decreased pill burden.  -Discontinued olmesartan  40 mg daily -Started olmesartan /hydrochlorothiazide  40/12.5 mg daily. -Continued amlodipine  10 mg daily -Continued hydralazine  50 mg TID -Continued carvedilol  25 mg BID -Continued furosemide  20 mg daily -Continued clonidine  0.1 mg daily prn BP >160/90 mmHg  Diabetes currently uncontrolled with a most recent A1c of 9.9% on 08/27/23, which is down from 11% on 02/04/23. Patient is able to verbalize appropriate hypoglycemia management plan. Patient has follow up later this week with endocrinology. Mounjaro and Ozempic are  both $0 on her insurance, which could be a good option for additional glycemic control. She would be a good candidate for an insulin  pump, but she reports that previous Omnipod use led to her severe hypoglycemia and hospitalization in the past.  -Continued basal insulin  Toujeo  (insulin  glargine)  80 units daily.  -Continued rapid insulin  Humalog  (insulin  lispro) 15 units TID with correction sliding scale.  -Continued GLP-1 Trulicity  (dulaglutide ) 4.5 mg weekly. Discussed for her to bring up Mounjaro or Ozempic at follow up with Endo later this week.  -Counseled on relative vs true hypoglycemia.  -Extensively discussed pathophysiology of diabetes, recommended lifestyle interventions, dietary effects on blood sugar control.  -Counseled on s/sx of and management of hypoglycemia.  -Placed Freestyle Libre 3+ CGM in clinic today. Educated on proper placement technique.  -Next A1c anticipated 11/2023.   ASCVD risk - secondary prevention in patient with diabetes. Last LDL is 60 mg/dL, not at goal of <44 mg/dL.  -Continued rosuvastatin  40 mg daily.  -Continued ezetimibe  10  mg daily   Patient verbalized understanding of treatment plan. Total time patient counseling 30 minutes.  Follow-up:  Endocrinology on 10/21/23 Pharmacist on 11/15/23 PCP clinic visit on 11/29/23  Peyton CHARLENA Ferries, PharmD Clinical Pharmacist [facility] [phone]

## 2023-10-20 LAB — TOXASSURE SELECT 13 (MW), URINE

## 2023-10-20 MED ORDER — OLMESARTAN MEDOXOMIL-HCTZ 40-12.5 MG PO TABS: 1.0000 | ORAL_TABLET | Freq: Every day | ORAL | 0 refills | Status: DC

## 2023-10-21 ENCOUNTER — Ambulatory Visit: Admitting: "Endocrinology

## 2023-10-22 NOTE — Progress Notes (Signed)
 [name]                                          MRN: [id number]   10/22/2023   The [facility]BCI Quality Team Specialist reviewed this patient medical record for the purposes of chart review for care gap closure. The following were reviewed: chart review for care gap closure-diabetic eye exam.    VBCI Quality Team

## 2023-10-25 ENCOUNTER — Other Ambulatory Visit: Payer: Self-pay | Admitting: Family Medicine

## 2023-10-25 ENCOUNTER — Other Ambulatory Visit: Payer: Self-pay | Admitting: Nurse Practitioner

## 2023-10-25 ENCOUNTER — Other Ambulatory Visit: Payer: Self-pay | Admitting: "Endocrinology

## 2023-10-25 DIAGNOSIS — M25551 Pain in right hip: Secondary | ICD-10-CM

## 2023-10-25 DIAGNOSIS — E162 Hypoglycemia, unspecified: Secondary | ICD-10-CM

## 2023-10-25 DIAGNOSIS — E1142 Type 2 diabetes mellitus with diabetic polyneuropathy: Secondary | ICD-10-CM

## 2023-10-25 DIAGNOSIS — E1165 Type 2 diabetes mellitus with hyperglycemia: Secondary | ICD-10-CM

## 2023-10-25 DIAGNOSIS — G894 Chronic pain syndrome: Secondary | ICD-10-CM

## 2023-10-25 DIAGNOSIS — G890 Central pain syndrome: Secondary | ICD-10-CM

## 2023-10-25 DIAGNOSIS — E119 Type 2 diabetes mellitus without complications: Secondary | ICD-10-CM

## 2023-10-26 DIAGNOSIS — J449 Chronic obstructive pulmonary disease, unspecified: Secondary | ICD-10-CM | POA: Diagnosis not present

## 2023-10-26 DIAGNOSIS — E1122 Type 2 diabetes mellitus with diabetic chronic kidney disease: Secondary | ICD-10-CM | POA: Diagnosis not present

## 2023-10-26 DIAGNOSIS — S92352D Displaced fracture of fifth metatarsal bone, left foot, subsequent encounter for fracture with routine healing: Secondary | ICD-10-CM | POA: Diagnosis not present

## 2023-10-26 DIAGNOSIS — K219 Gastro-esophageal reflux disease without esophagitis: Secondary | ICD-10-CM | POA: Diagnosis not present

## 2023-10-26 DIAGNOSIS — I69354 Hemiplegia and hemiparesis following cerebral infarction affecting left non-dominant side: Secondary | ICD-10-CM | POA: Diagnosis not present

## 2023-10-26 DIAGNOSIS — G43909 Migraine, unspecified, not intractable, without status migrainosus: Secondary | ICD-10-CM | POA: Diagnosis not present

## 2023-10-26 DIAGNOSIS — N181 Chronic kidney disease, stage 1: Secondary | ICD-10-CM | POA: Diagnosis not present

## 2023-10-26 DIAGNOSIS — M62838 Other muscle spasm: Secondary | ICD-10-CM | POA: Diagnosis not present

## 2023-10-26 DIAGNOSIS — E46 Unspecified protein-calorie malnutrition: Secondary | ICD-10-CM | POA: Diagnosis not present

## 2023-10-26 DIAGNOSIS — I5042 Chronic combined systolic (congestive) and diastolic (congestive) heart failure: Secondary | ICD-10-CM | POA: Diagnosis not present

## 2023-10-26 DIAGNOSIS — G4700 Insomnia, unspecified: Secondary | ICD-10-CM | POA: Diagnosis not present

## 2023-10-26 DIAGNOSIS — N3281 Overactive bladder: Secondary | ICD-10-CM | POA: Diagnosis not present

## 2023-10-26 DIAGNOSIS — I130 Hypertensive heart and chronic kidney disease with heart failure and stage 1 through stage 4 chronic kidney disease, or unspecified chronic kidney disease: Secondary | ICD-10-CM | POA: Diagnosis not present

## 2023-10-26 DIAGNOSIS — M778 Other enthesopathies, not elsewhere classified: Secondary | ICD-10-CM | POA: Diagnosis not present

## 2023-10-26 DIAGNOSIS — G4733 Obstructive sleep apnea (adult) (pediatric): Secondary | ICD-10-CM | POA: Diagnosis not present

## 2023-10-26 DIAGNOSIS — E1142 Type 2 diabetes mellitus with diabetic polyneuropathy: Secondary | ICD-10-CM | POA: Diagnosis not present

## 2023-10-26 DIAGNOSIS — R4701 Aphasia: Secondary | ICD-10-CM | POA: Diagnosis not present

## 2023-10-26 DIAGNOSIS — E782 Mixed hyperlipidemia: Secondary | ICD-10-CM | POA: Diagnosis not present

## 2023-10-26 DIAGNOSIS — H547 Unspecified visual loss: Secondary | ICD-10-CM | POA: Diagnosis not present

## 2023-10-26 DIAGNOSIS — I2510 Atherosclerotic heart disease of native coronary artery without angina pectoris: Secondary | ICD-10-CM | POA: Diagnosis not present

## 2023-10-26 NOTE — Telephone Encounter (Signed)
 Requested Prescriptions  Pending Prescriptions Disp Refills   albuterol  (ACCUNEB ) 1.25 MG/3ML nebulizer solution [Pharmacy Med Name: ALBUTEROL  NEB 1.25MG /3ML*25] 75 mL 0    Sig: USE 1 VIAL VIA NEBULIZER TWICE  DAILY     Pulmonology:  Beta Agonists 2 Failed - 10/26/2023  3:33 PM      Failed - Last BP in normal range    BP Readings from Last 1 Encounters:  10/18/23 (!) 142/88         Passed - Last Heart Rate in normal range    Pulse Readings from Last 1 Encounters:  10/14/23 89         Passed - Valid encounter within last 12 months    Recent Outpatient Visits           1 week ago Slurred speech   [facility] Gareth Mliss FALCON, [name]   2 months ago Encounter for support and coordination of transition of care   [facility] [name], [name]   4 months ago Uncontrolled hypertension   [facility] Glenard Mire, MD   7 months ago AKI (acute kidney injury)   [facility] [name], [name]   11 years ago HYPERTENSION   [facility] [place] Rai, Nydia POUR, MD       Future Appointments             In 1 month [name], [name] [facility] [place]             EPINEPHrine  0.3 mg/0.3 mL IJ SOAJ injection [place] Med Name: EPINEPHrine  AUTO 0.3MG  (Epipen )] 2 each 1    Sig: INJECT 1 PEN INTRAMUSCULARLY AS  NEEDED FOR ALLERGIC RESPONSE AS  DIRECTED BY MD. [facility]  HELP AFTER USE     Immunology: Antidotes Passed - 10/26/2023  3:33 PM      Passed - Valid encounter within last 12 months    Recent Outpatient Visits           1 week ago Slurred speech   [facility] Gareth Mliss FALCON, [name]   2 months ago Encounter for support and coordination of transition of care   [facility] [name], [name]   4 months ago Uncontrolled hypertension    [facility] Glenard Mire, MD   7 months ago AKI (acute kidney injury)   [facility] [name], [name]   11 years ago HYPERTENSION   [facility] [place]. [facility] Rai, Nydia POUR, MD       Future Appointments             In 1 month [name], [name] [facility] [place]

## 2023-10-28 ENCOUNTER — Ambulatory Visit: Admission: RE | Admit: 2023-10-28 | Source: Ambulatory Visit

## 2023-10-28 NOTE — Progress Notes (Signed)
 [name]                                          MRN: [id number]   10/28/2023   The VBCI Quality Team Specialist reviewed this patient medical record for the purposes of chart review for care gap closure. The following were reviewed: chart review for care gap closure-glycemic status assessment.    VBCI Quality Team

## 2023-10-29 DIAGNOSIS — Z8673 Personal history of transient ischemic attack (TIA), and cerebral infarction without residual deficits: Secondary | ICD-10-CM | POA: Diagnosis not present

## 2023-10-29 DIAGNOSIS — I10 Essential (primary) hypertension: Secondary | ICD-10-CM | POA: Diagnosis not present

## 2023-10-29 DIAGNOSIS — G4733 Obstructive sleep apnea (adult) (pediatric): Secondary | ICD-10-CM | POA: Diagnosis not present

## 2023-10-29 DIAGNOSIS — J449 Chronic obstructive pulmonary disease, unspecified: Secondary | ICD-10-CM | POA: Diagnosis not present

## 2023-10-30 ENCOUNTER — Other Ambulatory Visit: Payer: Self-pay | Admitting: "Endocrinology

## 2023-10-30 DIAGNOSIS — E1165 Type 2 diabetes mellitus with hyperglycemia: Secondary | ICD-10-CM

## 2023-11-01 ENCOUNTER — Ambulatory Visit

## 2023-11-01 ENCOUNTER — Other Ambulatory Visit: Payer: Self-pay

## 2023-11-01 MED ORDER — TRULICITY 4.5 MG/0.5ML ~~LOC~~ SOAJ: 4.5000 mg | SUBCUTANEOUS | 0 refills | Status: DC

## 2023-11-02 ENCOUNTER — Telehealth: Payer: Self-pay

## 2023-11-02 NOTE — Telephone Encounter (Signed)
 Copied from CRM [id number]. Topic: General - Other >> Nov 01, 2023  4:49 PM Shanda MATSU wrote: Reason for CRM: Ellin w/[facility] [phone] called in to report patient missing OT on 09/24, 10/02 and 10/29/2023.

## 2023-11-02 NOTE — Progress Notes (Signed)
 [name]                                          MRN: [id number]   11/02/2023   The VBCI Quality Team Specialist reviewed this patient medical record for the purposes of chart review for care gap closure. The following were reviewed: chart review for care gap closure-colorectal cancer screening.    VBCI Quality Team

## 2023-11-04 DIAGNOSIS — J449 Chronic obstructive pulmonary disease, unspecified: Secondary | ICD-10-CM | POA: Diagnosis not present

## 2023-11-09 ENCOUNTER — Encounter: Payer: Self-pay | Admitting: Student in an Organized Health Care Education/Training Program

## 2023-11-10 ENCOUNTER — Encounter: Payer: Self-pay | Admitting: Student in an Organized Health Care Education/Training Program

## 2023-11-11 ENCOUNTER — Encounter: Admitting: Nurse Practitioner

## 2023-11-15 ENCOUNTER — Telehealth: Payer: Self-pay

## 2023-11-15 ENCOUNTER — Ambulatory Visit

## 2023-11-15 NOTE — Telephone Encounter (Signed)
VO given to Vernona Rieger  ?

## 2023-11-15 NOTE — Telephone Encounter (Signed)
 Copied from CRM [id number]. Topic: Clinical - Home Health Verbal Orders >> Nov 12, 2023  4:59 PM Selinda RAMAN wrote: Caller/Agency: Leita PT with [facility]  Callback Number: [phone] Service Requested: Physical Therapy Any new concerns about the patient? No but patient missed a visit this week and she will be resuming care next week.  Please assist patient further

## 2023-11-15 NOTE — Progress Notes (Deleted)
 S:     No chief complaint on file.   Reason for visit: ?  [name] is a 58 y.o. female with a history of diabetes (type 2), who presents today for a follow up diabetes pharmacotherapy visit.? Pertinent PMH also includes HTN, PAD, CAD, GERD, CVA, HLD, obesity, seizures, MDD, and HFpEF.  Known DM Complications: nephropathy, peripheral neuropathy, and retinopathy   Care Team: Primary Care Provider: [name], [name]  At last visit with endocrinology on 06/15/23, patient was instructed to increase Toujeo  from 33 units to 50 units once daily. Humalog  was increased to 15 units TID with meals. Trulicity  was increased from 3 mg to 4.5 mg weekly. Patient has had to cancel 3 visits with endocrinology since that time.   At last visit with clinical pharmacist on 10/18/23, she reported not wearing CGM for over a month as it had been falling off. She reported a BG range of 150-300 mg/dL and hypoglycemic sx if her BG drops below 150 mg/dL. Home BP readings were 130-120/80-90 mmHg, however she reported taking clonidine  2 times per week with BP >160/90 mmHg. Patient was switched from olmesartan  40 mg to olmesartan /hydrochlorothiazide  40/12.5 mg daily.   Current diabetes medications include: Humalog  (15 units TID + correction), Toujeo  80 units daily, Trulicity  4.5 mg weekly (Saturdays) Previous diabetes medications include: glipizide , metformin, Omnipod, Farxiga  (recurrent UTI) Current hyperlipidemia medications include: rosuvastatin  40 mg daily, ezetimibe  10 mg daily   Patient reports adherence to taking all medications as prescribed.   Have you been experiencing any side effects to the medications prescribed? no Do you have any problems obtaining medications due to transportation or finances? no Insurance coverage: [facility] Medicare  Patient denies hypoglycemic events.  Reported home fasting blood sugars: 150-300 mg/dL *has not been wearing CGM for some time  Hypertension: Current  medications: olmesartan /hydrochlorothiazide   40/12.5 mg daily, amlodipine  10 mg daily, carvedilol  25 mg BID, furosemide  20 mg daily, hydralazine  50 mg TID, and clonidine  0.1 mg daily  Medications previously tried: spironolactone  (d/c'ed with hypotension in the past)  Patient has a validated, automated, upper arm home BP cuff Current blood pressure readings readings: 130-120/80-90s *Patient also stated the DBP is frequently >100 mmHg, but was unable to provide a frequency. -Takes clonidine  about two times per week when BP is >160/90 mmHg  Patient denies hypotensive s/sx including dizziness, lightheadedness.  Patient denies hypertensive symptoms including chest pain  Patient endorses headaches and shortness of breath   DM Prevention:  Statin: Taking; high intensity.?  History of albuminuria? yes, last UACR on 11/17/21 = 36 mg/g Last eye exam:  Lab Results  Component Value Date   HMDIABEYEEXA No Retinopathy 09/17/2021   Tobacco Use:  Tobacco Use: Medium Risk (10/14/2023)   Patient History    Smoking Tobacco Use: Former    Smokeless Tobacco Use: Never    Passive Exposure: Not on file   O:  Vitals:  Wt Readings from Last 3 Encounters:  10/14/23 254 lb 1.6 oz (115.3 kg)  10/14/23 240 lb (108.9 kg)  09/16/23 241 lb (109.3 kg)   BP Readings from Last 3 Encounters:  10/18/23 (!) 142/88  10/14/23 (!) 144/76  10/14/23 (!) 156/83   Pulse Readings from Last 3 Encounters:  10/14/23 89  10/14/23 86  09/18/23 81     Labs:?  Lab Results  Component Value Date   HGBA1C 9.9 (H) 08/27/2023   HGBA1C 11.0 (A) 02/04/2023   HGBA1C 9.7 (H) 07/20/2022   GLUCOSE 182 (H) 09/17/2023  MICRALBCREAT 36 (H) 11/17/2021   MICRALBCREAT 202 (H) 01/24/2018   MICRALBCREAT 9 11/11/2015   CREATININE 0.83 09/17/2023   CREATININE 0.88 09/16/2023   CREATININE 0.99 09/16/2023   GFR 74.92 04/14/2022   GFR 76.80 09/30/2020   GFR 52.45 (L) 06/10/2020    Lab Results  Component Value Date   CHOL 129  02/16/2023   LDLCALC 60 02/16/2023   LDLCALC 67 06/02/2021   LDLCALC 93 06/10/2020   HDL 45 (L) 02/16/2023   TRIG 166 (H) 02/16/2023   TRIG 71 06/19/2021   TRIG 126 06/16/2021   ALT 17 09/17/2023   ALT 18 09/16/2023   AST 18 09/17/2023   AST 17 09/16/2023      Chemistry      Component Value Date/Time   NA 140 09/17/2023 0447   NA 138 05/08/2015 1106   K 3.8 09/17/2023 0447   CL 107 09/17/2023 0447   CO2 26 09/17/2023 0447   BUN 16 09/17/2023 0447   BUN 14 05/08/2015 1106   CREATININE 0.83 09/17/2023 0447   CREATININE 0.90 08/27/2023 1451      Component Value Date/Time   CALCIUM  8.7 (L) 09/17/2023 0447   ALKPHOS 79 09/17/2023 0447   AST 18 09/17/2023 0447   ALT 17 09/17/2023 0447   BILITOT 0.3 09/17/2023 0447   BILITOT 0.3 05/08/2015 1106       The ASCVD Risk score (Arnett DK, et al., 2019) failed to calculate for the following reasons:   Risk score cannot be calculated because patient has a medical history suggesting prior/existing ASCVD  Lab Results  Component Value Date   MICRALBCREAT 36 (H) 11/17/2021   MICRALBCREAT 202 (H) 01/24/2018   MICRALBCREAT 9 11/11/2015   MICRALBCREAT 21.7 05/23/2013    A/P: Hypertension longstanding currently uncontrolled with reported use of PRN clonidine  2x per week when BP is >160/90 mmHg. Blood pressure goal of <130/80 mmHg. Medication adherence appropriate. Will add on a thiazide agent in combination with her current RAAS agent. Could consider combining with her CCB in the past for decreased pill burden.  -Discontinued olmesartan  40 mg daily -Started olmesartan /hydrochlorothiazide  40/12.5 mg daily. -Continued amlodipine  10 mg daily -Continued hydralazine  50 mg TID -Continued carvedilol  25 mg BID -Continued furosemide  20 mg daily -Continued clonidine  0.1 mg daily prn BP >160/90 mmHg  Diabetes currently uncontrolled with a most recent A1c of 9.9% on 08/27/23, which is down from 11% on 02/04/23. Patient is able to verbalize  appropriate hypoglycemia management plan. Patient has follow up later this week with endocrinology. Mounjaro and Ozempic are both $0 on her insurance, which could be a good option for additional glycemic control. She would be a good candidate for an insulin  pump, but she reports that previous Omnipod use led to her severe hypoglycemia and hospitalization in the past.  -Continued basal insulin  Toujeo  (insulin  glargine)  80 units daily.  -Continued rapid insulin  Humalog  (insulin  lispro) 15 units TID with correction sliding scale.  -Continued GLP-1 Trulicity  (dulaglutide ) 4.5 mg weekly. Discussed for her to bring up Mounjaro or Ozempic at follow up with Endo later this week.  -Counseled on relative vs true hypoglycemia.  -Extensively discussed pathophysiology of diabetes, recommended lifestyle interventions, dietary effects on blood sugar control.  -Counseled on s/sx of and management of hypoglycemia.  -Placed Freestyle Libre 3+ CGM in clinic today. Educated on proper placement technique.  -Next A1c anticipated 11/2023.   ASCVD risk - secondary prevention in patient with diabetes. Last LDL is 60 mg/dL, not at goal of <  55 mg/dL.  -Continued rosuvastatin  40 mg daily.  -Continued ezetimibe  10 mg daily   Patient verbalized understanding of treatment plan. Total time patient counseling 30 minutes.  Follow-up:  Endocrinology on 10/21/23 Pharmacist on 11/15/23 PCP clinic visit on 11/29/23  Peyton CHARLENA Ferries, PharmD Clinical Pharmacist [facility] [phone]

## 2023-11-17 ENCOUNTER — Other Ambulatory Visit: Payer: Self-pay | Admitting: *Deleted

## 2023-11-17 ENCOUNTER — Encounter: Admitting: Nurse Practitioner

## 2023-11-17 DIAGNOSIS — M25551 Pain in right hip: Secondary | ICD-10-CM

## 2023-11-17 MED ORDER — TIZANIDINE HCL 4 MG PO TABS: 4.0000 mg | ORAL_TABLET | Freq: Three times a day (TID) | ORAL | 2 refills | Status: DC | PRN

## 2023-11-18 ENCOUNTER — Encounter: Payer: Self-pay | Admitting: Nurse Practitioner

## 2023-11-18 ENCOUNTER — Telehealth: Payer: Self-pay

## 2023-11-18 ENCOUNTER — Ambulatory Visit: Attending: Nurse Practitioner | Admitting: Nurse Practitioner

## 2023-11-18 VITALS — BP 135/84 | HR 86 | Temp 97.7°F | Resp 16 | Ht 65.0 in | Wt 240.0 lb

## 2023-11-18 DIAGNOSIS — G894 Chronic pain syndrome: Secondary | ICD-10-CM | POA: Insufficient documentation

## 2023-11-18 DIAGNOSIS — E1142 Type 2 diabetes mellitus with diabetic polyneuropathy: Secondary | ICD-10-CM | POA: Insufficient documentation

## 2023-11-18 DIAGNOSIS — G890 Central pain syndrome: Secondary | ICD-10-CM | POA: Diagnosis not present

## 2023-11-18 DIAGNOSIS — Z794 Long term (current) use of insulin: Secondary | ICD-10-CM | POA: Diagnosis not present

## 2023-11-18 DIAGNOSIS — M25552 Pain in left hip: Secondary | ICD-10-CM | POA: Diagnosis not present

## 2023-11-18 DIAGNOSIS — M25551 Pain in right hip: Secondary | ICD-10-CM | POA: Insufficient documentation

## 2023-11-18 DIAGNOSIS — Z79899 Other long term (current) drug therapy: Secondary | ICD-10-CM | POA: Diagnosis not present

## 2023-11-18 MED ORDER — TIZANIDINE HCL 4 MG PO TABS
4.0000 mg | ORAL_TABLET | Freq: Three times a day (TID) | ORAL | 2 refills | Status: AC | PRN
Start: 2023-11-18 — End: ?

## 2023-11-18 MED ORDER — GABAPENTIN 600 MG PO TABS: ORAL_TABLET | ORAL | 5 refills | Status: DC

## 2023-11-18 NOTE — Progress Notes (Signed)
 Nursing Pain Medication Assessment:  Safety precautions to be maintained throughout the outpatient stay will include: orient to surroundings, keep bed in low position, maintain call bell within reach at all times, provide assistance with transfer out of bed and ambulation.  Medication Inspection Compliance: [name] did not comply with our request to bring her pills to be counted. She was reminded that bringing the medication bottles, even when empty, is a requirement.  Medication: None brought in. Pill/Patch Count: None available to be counted. Bottle Appearance: No container available. Did not bring bottle(s) to appointment. Filled Date: N/A Last Medication intake:  Day before yesterday last took medication but states she has enough to last until early December.

## 2023-11-18 NOTE — Progress Notes (Signed)
 PROVIDER NOTE: Interpretation of information contained herein should be left to medically-trained personnel. Specific patient instructions are provided elsewhere under Patient Instructions section of medical record. This document was created in part using AI and STT-dictation technology, any transcriptional errors that may result from this process are unintentional.  Patient: [name]  Service: E/M   PCP: [name], [name], [name]  DOB: April 06, 1965  DOS: 11/18/2023  Provider: Emmy MARLA Blanch, NP  MRN: [id number]  Delivery: Face-to-face  Specialty: Interventional Pain Management  Type: Established Patient  Setting: Ambulatory outpatient facility  Specialty designation: 09  Referring Prov.: [name], [name]  Location: Outpatient office facility       History of present illness (HPI) [name], a 58 y.o. year old female, is here today because of her Bilateral arm pain, back pain, left leg pain and left hip pain. [name] primary complain today is Arm Pain (Bilateral ), Back Pain, Leg Pain (Left), and Hip Pain (Left )  Pertinent problems: [name] has  Obesity, Class III, BMI 40-49.9 (morbid obesity) (HCC); Dyslipidemia associated with type 2 diabetes mellitus (HCC); Chronic pain syndrome; Diabetic polyneuropathy associated with type 2 diabetes mellitus (HCC); and CAD (coronary artery disease) on their pertinent problem list.  Pain Assessment: Severity of Chronic pain is reported as a 6 /10. Location: Leg Left/From lower left back into left hips down entire left leg to left foot. Onset: More than a month ago. Quality: Aching, Sharp, Constant. Timing: Constant. Modifying factor(s): Pain medication, walking, and heating pad. Vitals:  height is 5' 5 (1.651 m) and weight is 240 lb (108.9 kg). Her temporal temperature is 97.7 F (36.5 C). Her blood pressure is 135/84 and her pulse is 86. Her respiration is 16 and oxygen saturation is 98%.  BMI: Estimated body mass index is 39.94  kg/m as calculated from the following:   Height as of this encounter: 5' 5 (1.651 m).   Weight as of this encounter: 240 lb (108.9 kg).  Last encounter: 10/14/2023. Last procedure: Visit date not found.  Reason for encounter: follow-up evaluation.   Discussed the use of AI scribe software for clinical note transcription with the patient, who gave verbal consent to proceed.  History of Present Illness   [name] is a 58 year old female who presents for pain management. She experiences pain primarily on the left side, including the shoulder area, middle back, and lower back. The pain occasionally radiates across her back and is described as intermittent. She manages her pain with Percocet, which she finds more effective than previous medications. Although prescribed every eight hours, she does not require it that frequently, and a 30-day prescription lasts about two and a half months. Previously, she used Butrans  patches but found them less effective.  She is also taking gabapentin  and tizanidine  (Zenflex) for her condition.   Pharmacotherapy Assessment   Gabapentin  600 mg every morning, 600 mg every afternoon, 1200 mg nightly for neuropathic pain. Zanaflex  4 mg every 8 hours as needed for muscle spasm. Monitoring: [place] PMP: PDMP reviewed during this encounter.       Pharmacotherapy: No side-effects or adverse reactions reported. Compliance: No problems identified. Effectiveness: Clinically acceptable.  Bonner Norris, RN  11/18/2023  8:56 AM  Sign when Signing Visit Nursing Pain Medication Assessment:  Safety precautions to be maintained throughout the outpatient stay will include: orient to surroundings, keep bed in low position, maintain call bell within reach at all times, provide assistance with transfer  out of bed and ambulation.  Medication Inspection Compliance: [name] did not comply with our request to bring her pills to be counted. She was reminded that  bringing the medication bottles, even when empty, is a requirement.  Medication: None brought in. Pill/Patch Count: None available to be counted. Bottle Appearance: No container available. Did not bring bottle(s) to appointment. Filled Date: N/A Last Medication intake:  Day before yesterday last took medication but states she has enough to last until early December.       UDS:  Summary  Date Value Ref Range Status  10/14/2023 FINAL  Final    Comment:    ==================================================================== [facility] (MW) ==================================================================== Test                             Result       Flag       Units  Drug Absent but Declared for Prescription Verification   Oxycodone                       Not Detected UNEXPECTED ng/mg creat   Butalbital                      Not Detected UNEXPECTED ==================================================================== Test                      Result    Flag   Units      Ref Range   Creatinine              72               mg/dL      >=79 ==================================================================== Declared Medications:  The flagging and interpretation on this report are based on the  following declared medications.  Unexpected results may arise from  inaccuracies in the declared medications.   **Note: The testing scope of this panel includes these medications:   Butalbital  (Fioricet )  Oxycodone  (Percocet)   **Note: The testing scope of this panel does not include the  following reported medications:   Acetaminophen  (Fioricet )  Acetaminophen  (Percocet)  Albuterol  (AccuNeb )  Amitriptyline  (Elavil )  Amlodipine  (Norvasc )  Caffeine  (Fioricet )  Carvedilol  (Coreg )  Clonidine  (Catapres )  Clopidogrel  (Plavix )  Cyclosporine  (Restasis )  Dulaglutide  (Trulicity )  Duloxetine  (Cymbalta )  Epinephrine  (EpiPen )  Ezetimibe  (Zetia )  Fluticasone  (Wixela Inhub)   Furosemide  (Lasix )  Gabapentin  (Neurontin )  Glucagon  (Gvoke)  Hydralazine  (Apresoline )  Insulin  (Toujeo )  Levetiracetam  (Keppra )  Meclizine  (Antivert )  Mirabegron  (Myrbetriq )  Nitroglycerin  (Nitrostat )  Olmesartan  (Benicar )  Pantoprazole  (Protonix )  Potassium (Klor-Con )  Rosuvastatin  (Crestor )  Salmeterol (Wixela Inhub)  Tizanidine  (Zanaflex )  Ubrogepant  (Ubrelvy )  Vitamin D3 ==================================================================== For clinical consultation, please call [phone]. ====================================================================     No results found for: CBDTHCR No results found for: D8THCCBX No results found for: D9THCCBX  ROS  Constitutional: Denies any fever or chills Gastrointestinal: No reported hemesis, hematochezia, vomiting, or acute GI distress Musculoskeletal: Low back pain, left hip pain, left leg pain, bilateral arm pain  Neurological: No reported episodes of acute onset apraxia, aphasia, dysarthria, agnosia, amnesia, paralysis, loss of coordination, or loss of consciousness  Medication Review  DULoxetine , Dulaglutide , EPINEPHrine , FreeStyle Libre 3 Plus Sensor, FreeStyle Libre 3 Sensor, Glucagon , Insulin  Pen Needle, Ubrogepant , albuterol , amLODipine , amitriptyline , butalbital -acetaminophen -caffeine , carvedilol , cholecalciferol , cloNIDine , clopidogrel , cycloSPORINE , ezetimibe , fluticasone -salmeterol, furosemide , gabapentin , glucose blood, hydrALAZINE , insulin  glargine (2 Unit Dial ), insulin  lispro, levETIRAcetam , meclizine , mirabegron  ER, nitroGLYCERIN , olmesartan -hydrochlorothiazide , pantoprazole ,  potassium chloride  SA, rosuvastatin , and tiZANidine   History Review  Allergy: [name] is allergic to codeine, farxiga  [dapagliflozin ], and lactose intolerance (gi). Drug: [name]  reports no history of drug use. Alcohol :  reports that she does not currently use alcohol  after a past usage of about 1.0 standard drink of  alcohol  per week. Tobacco:  reports that she quit smoking about 36 years ago. Her smoking use included cigarettes. She started smoking about 51 years ago. She has a 15 pack-year smoking history. She has never used smokeless tobacco. Social: [name]  reports that she quit smoking about 36 years ago. Her smoking use included cigarettes. She started smoking about 51 years ago. She has a 15 pack-year smoking history. She has never used smokeless tobacco. She reports that she does not currently use alcohol  after a past usage of about 1.0 standard drink of alcohol  per week. She reports that she does not use drugs. Medical:  has a past medical history of Anxiety and depression (09/13/2006), Arthritis, Asthma, CHF (congestive heart failure) (HCC), COPD (chronic obstructive pulmonary disease) (HCC), Depression, Diabetes mellitus, Diabetic neuropathy, painful (HCC), Diabetic retinopathy (HCC), Diastolic dysfunction, Dilated aortic root, Dizziness, Epilepsy idiopathic petit mal (HCC) (09/13/2006), GERD (gastroesophageal reflux disease), Glaucoma, Headache(784.0), Heart murmur, History of stress test, blood clots, Hyperlipidemia, Hypertension, Hypertensive urgency (04/23/2021), Legally blind, MIGRAINE HEADACHE (09/14/2006), Myocardial infarction [facility]), Nephrolithiasis, Pain (09/23/2011), Pneumonia, Pyelonephritis, SBO (small bowel obstruction) (HCC) (04/23/2021), Seizures (HCC), Sleep apnea, Small bowel obstruction (HCC) (05/18/2021), Stress incontinence, Stroke (HCC), and Syncope (07/07/2021). Surgical: [name]  has a past surgical history that includes kidney stone removal; Brain hematoma evacuation; Shunt removal; Retinal detachment surgery (Left, 1990); Vaginal hysterectomy (1996); Incisional hernia repair (05/05/2011); Mass excision (05/05/2011); PCNL; Cystoscopy/retrograde/ureteroscopy (08/24/2011); Cystoscopy with ureteroscopy (09/24/2011); Cystoscopy w/ retrogrades (09/24/2011); RIGHT/LEFT HEART CATH AND CORONARY  ANGIOGRAPHY (N/A, 04/28/2019); and laparotomy (N/A, 06/12/2021). Family: family history includes ADD / ADHD in her maternal grandfather; Birth defects in her daughter and maternal grandfather; Brain cancer in her maternal grandmother; Cancer in her mother; Cirrhosis in her maternal grandfather; Diabetes in her maternal grandfather; Hypertension in her daughter, maternal grandmother, and mother; Uterine cancer in her mother.  Laboratory Chemistry Profile   Renal Lab Results  Component Value Date   BUN 16 09/17/2023   CREATININE 0.83 09/17/2023   LABCREA 25 11/17/2021   BCR SEE NOTE: 08/27/2023   GFR 74.92 04/14/2022   GFRAA >60 05/30/2019   GFRNONAA >60 09/17/2023    Hepatic Lab Results  Component Value Date   AST 18 09/17/2023   ALT 17 09/17/2023   ALBUMIN  3.1 (L) 09/17/2023   ALKPHOS 79 09/17/2023   LIPASE 45 06/12/2023    Electrolytes Lab Results  Component Value Date   NA 140 09/17/2023   K 3.8 09/17/2023   CL 107 09/17/2023   CALCIUM  8.7 (L) 09/17/2023   MG 2.1 09/17/2023   PHOS 2.8 09/17/2023    Bone Lab Results  Component Value Date   VD25OH 33 11/17/2021    Inflammation (CRP: Acute Phase) (ESR: Chronic Phase) Lab Results  Component Value Date   CRP 16.7 (H) 06/14/2021   ESRSEDRATE 65 (H) 06/14/2021   LATICACIDVEN 1.6 08/03/2022         Note: Above Lab results reviewed.  Recent Imaging Review  MR ANGIO HEAD WO CONTRAST CLINICAL DATA:  Neuro deficit, acute, stroke suspected  EXAM: MRI HEAD WITHOUT CONTRAST  MRA HEAD WITHOUT CONTRAST  TECHNIQUE: Multiplanar, multi-echo pulse sequences of the brain and surrounding structures  were acquired without intravenous contrast. Angiographic images of the Circle of Willis were acquired using MRA technique without intravenous contrast.  COMPARISON:  None Available.  FINDINGS: MRI HEAD FINDINGS  Brain: No acute infarction, hemorrhage, hydrocephalus, extra-axial collection or mass lesion.  Vascular: See  below.  Skull and upper cervical spine: Normal marrow signal.  Sinuses/Orbits: Clear sinuses.  No acute orbital findings.  Other: No mastoid effusions.  MRA HEAD FINDINGS  Anterior circulation: Bilateral intracranial ICAs, MCAs, and ACAs are patent without proximal hemodynamically significant stenosis.  Posterior circulation: Bilateral intradural vertebral arteries, basilar artery and bilateral posterior cerebral arteries are patent without proximal hemodynamically significant stenosis.  IMPRESSION: 1. No evidence of acute intracranial abnormality. 2. No large vessel occlusion or proximal hemodynamically significant stenosis.  Electronically Signed   By: Gilmore GORMAN Molt M.D.   On: 09/16/2023 16:53 MR BRAIN WO CONTRAST CLINICAL DATA:  Neuro deficit, acute, stroke suspected  EXAM: MRI HEAD WITHOUT CONTRAST  MRA HEAD WITHOUT CONTRAST  TECHNIQUE: Multiplanar, multi-echo pulse sequences of the brain and surrounding structures were acquired without intravenous contrast. Angiographic images of the Circle of Willis were acquired using MRA technique without intravenous contrast.  COMPARISON:  None Available.  FINDINGS: MRI HEAD FINDINGS  Brain: No acute infarction, hemorrhage, hydrocephalus, extra-axial collection or mass lesion.  Vascular: See below.  Skull and upper cervical spine: Normal marrow signal.  Sinuses/Orbits: Clear sinuses.  No acute orbital findings.  Other: No mastoid effusions.  MRA HEAD FINDINGS  Anterior circulation: Bilateral intracranial ICAs, MCAs, and ACAs are patent without proximal hemodynamically significant stenosis.  Posterior circulation: Bilateral intradural vertebral arteries, basilar artery and bilateral posterior cerebral arteries are patent without proximal hemodynamically significant stenosis.  IMPRESSION: 1. No evidence of acute intracranial abnormality. 2. No large vessel occlusion or proximal hemodynamically  significant stenosis.  Electronically Signed   By: Gilmore GORMAN Molt M.D.   On: 09/16/2023 16:53 CT Head Wo Contrast EXAM: CT HEAD WITHOUT CONTRAST 09/16/2023 03:14:28 PM  TECHNIQUE: CT of the head was performed without the administration of intravenous contrast. Automated exposure control, iterative reconstruction, and/or weight based adjustment of the mA/kV was utilized to reduce the radiation dose to as low as reasonably achievable.  COMPARISON: Head CT 07/25/2023 and MRI 06/10/2022.  CLINICAL HISTORY: Neuro deficit, acute, stroke suspected. Per EMS patient last know well at 2300 on 09/15/23; at 1030 today family noticed slurred speech. 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 Patient is on Plavix .  FINDINGS:  BRAIN AND VENTRICLES: No acute hemorrhage. Gray-white differentiation is preserved. No hydrocephalus. No extra-axial collection. No mass effect or midline shift. Mild cerebral atrophy. Mild cerebral white matter hypodensities, similar to the prior CT and nonspecific but compatible with chronic small vessel ischemic disease. Prominent dural calcifications.  ORBITS: No acute abnormality.  SINUSES: No acute abnormality.  SOFT TISSUES AND SKULL: No acute soft tissue abnormality. No skull fracture.  IMPRESSION: 1. No acute intracranial abnormality. 2. Mild cerebral atrophy and chronic small vessel ischemic disease.  Electronically signed by: Dasie Hamburg MD 09/16/2023 03:23 PM EDT RP Workstation: [id number] Note: Reviewed        Physical Exam  Vitals: BP 135/84 (Patient Position: Sitting)   Pulse 86   Temp 97.7 F (36.5 C) (Temporal)   Resp 16   Ht 5' 5 (1.651 m)   Wt 240 lb (108.9 kg)   LMP  (LMP Unknown)   SpO2 98%   BMI 39.94 kg/m  BMI: Estimated body mass index is 39.94 kg/m as calculated  from the following:   Height as of this encounter: 5' 5 (1.651 m).   Weight as of this encounter: 240 lb (108.9 kg). Ideal: Ideal body  weight: 57 kg (125 lb 10.6 oz) Adjusted ideal body weight: 77.7 kg (171 lb 6.4 oz) General appearance: Well nourished, well developed, and well hydrated. In no apparent acute distress Mental status: Alert, oriented x 3 (person, place, & time)       Respiratory: No evidence of acute respiratory distress Eyes: PERLA  Musculoskeletal: +LBP, bilateral arm pain, left hip pain  Assessment   Diagnosis Status  1. Chronic pain syndrome   2. Bilateral hip pain   3. Medication management   4. Central pain syndrome   5. Diabetic polyneuropathy associated with type 2 diabetes mellitus (HCC)    Controlled Controlled Controlled   Updated Problems: No problems updated.  Plan of Care  Problem-specific:  Assessment and Plan    Chronic pain syndrome with pain in left shoulder, left lower back, and left foot Chronic pain primarily in left shoulder, lower back, and foot. Pain is intermittent, radiating across the back. Percocet effective, Butrans  patch ineffective. - Continue Percocet as needed, next prescription in December if current supply lasts. Patient's pain is well controlled with oxycodone , will continue on current medication regimen. Although I did not prescribe today's visit as she does not require it that frequently, and a 30-day prescription lasts about two and a half months. Prescribing (PMP) reviewed, finding consistent with the use of prescribe medication. UDS up to date. No side effects or adverse reaction reported to Oxycodone , gabapentin , and Zanflex. Schedule follow up in 30 days for medication management.  Type 2 diabetes mellitus with diabetic polyneuropathy Type 2 diabetes with diabetic polyneuropathy. - Send prescriptions for gabapentin  and tizanidine  to [facility].   Medication management: Meds ordered this encounter  Medications   gabapentin  (NEURONTIN ) 600 MG tablet    Sig: 600 mg qAM, 600 mg qPM, 1200 mg QHS    Dispense:  120 tablet    Refill:  5    tiZANidine  (ZANAFLEX ) 4 MG tablet    Sig: Take 1 tablet (4 mg total) by mouth every 8 (eight) hours as needed for muscle spasms.    Dispense:  90 tablet    Refill:  2        [name] has a current medication list which includes the following long-term medication(s): albuterol , albuterol , amitriptyline , amlodipine , duloxetine , ezetimibe , furosemide , toujeo  max solostar, insulin  lispro, levetiracetam , mirabegron  er, olmesartan -hydrochlorothiazide , pantoprazole , rosuvastatin , wixela inhub, and gabapentin .  Pharmacotherapy (Medications Ordered): Meds ordered this encounter  Medications   gabapentin  (NEURONTIN ) 600 MG tablet    Sig: 600 mg qAM, 600 mg qPM, 1200 mg QHS    Dispense:  120 tablet    Refill:  5   tiZANidine  (ZANAFLEX ) 4 MG tablet    Sig: Take 1 tablet (4 mg total) by mouth every 8 (eight) hours as needed for muscle spasms.    Dispense:  90 tablet    Refill:  2   Orders:  No orders of the defined types were placed in this encounter.       Return in about 1 month (around 12/19/2023) for (F2F), (MM), Emmy Blanch NP.    Recent Visits Date Type Provider Dept  10/14/23 Office Visit Cicely Ortner K, NP [facility]  Showing recent visits within past 90 days and meeting all other requirements Today's Visits Date Type Provider Dept  11/18/23 Office Visit  Mischele Detter K, NP [facility]  Showing today's visits and meeting all other requirements Future Appointments Date Type Provider Dept  12/06/23 Appointment Joleena Weisenburger K, NP [facility]  Showing future appointments within next 90 days and meeting all other requirements  I discussed the assessment and treatment plan with the patient. The patient was provided an opportunity to ask questions and all were answered. The patient agreed with the plan and demonstrated an understanding of the instructions.  Patient advised to call back or seek an in-person evaluation if the symptoms or  condition worsens.  I personally spent a total of 30 minutes in the care of the patient today including preparing to see the patient, getting/reviewing separately obtained history, performing a medically appropriate exam/evaluation, counseling and educating, placing orders, referring and communicating with other health care professionals, documenting clinical information in the EHR, independently interpreting results, communicating results, and coordinating care.   Note by: Tonda Wiederhold K Victorious Kundinger, NP (TTS and AI technology used. I apologize for any typographical errors that were not detected and corrected.) Date: 11/18/2023; Time: 9:50 AM

## 2023-11-18 NOTE — Telephone Encounter (Signed)
 Patient was identified as falling into the [place] Measure - Diabetes.   Patient was: Appointment already scheduled for:  11/10.    Patient was identified as falling into the [place] Measure - Diabetes.   Patient was: Referred to pharmacy for chronic disease management.  Has already seen pharmacist Allyson

## 2023-11-23 ENCOUNTER — Other Ambulatory Visit: Payer: Self-pay | Admitting: *Deleted

## 2023-11-23 DIAGNOSIS — G890 Central pain syndrome: Secondary | ICD-10-CM

## 2023-11-23 DIAGNOSIS — G894 Chronic pain syndrome: Secondary | ICD-10-CM

## 2023-11-23 DIAGNOSIS — E1142 Type 2 diabetes mellitus with diabetic polyneuropathy: Secondary | ICD-10-CM

## 2023-11-23 MED ORDER — AMITRIPTYLINE HCL 25 MG PO TABS
50.0000 mg | ORAL_TABLET | Freq: Every day | ORAL | 5 refills | Status: AC
Start: 2023-11-23 — End: 2024-05-21

## 2023-11-27 ENCOUNTER — Ambulatory Visit (INDEPENDENT_AMBULATORY_CARE_PROVIDER_SITE_OTHER)

## 2023-11-27 ENCOUNTER — Encounter (HOSPITAL_COMMUNITY): Payer: Self-pay

## 2023-11-27 ENCOUNTER — Other Ambulatory Visit: Payer: Self-pay | Admitting: Family Medicine

## 2023-11-27 ENCOUNTER — Encounter: Payer: Self-pay | Admitting: Student in an Organized Health Care Education/Training Program

## 2023-11-27 ENCOUNTER — Ambulatory Visit (HOSPITAL_COMMUNITY)
Admission: EM | Admit: 2023-11-27 | Discharge: 2023-11-27 | Disposition: A | Discharge status: Home / Self Care | Attending: Family Medicine | Admitting: Family Medicine

## 2023-11-27 DIAGNOSIS — J4521 Mild intermittent asthma with (acute) exacerbation: Secondary | ICD-10-CM

## 2023-11-27 DIAGNOSIS — J0100 Acute maxillary sinusitis, unspecified: Secondary | ICD-10-CM

## 2023-11-27 MED ORDER — IPRATROPIUM-ALBUTEROL 0.5-2.5 (3) MG/3ML IN SOLN
3.0000 mL | Freq: Once | RESPIRATORY_TRACT | Status: AC
  Administered 2023-11-27: 3 mL via RESPIRATORY_TRACT

## 2023-11-27 MED ORDER — IPRATROPIUM-ALBUTEROL 0.5-2.5 (3) MG/3ML IN SOLN
RESPIRATORY_TRACT | Status: AC
Start: 2023-11-27 — End: 2023-11-27
  Filled 2023-11-27: qty 3

## 2023-11-27 MED ORDER — DOXYCYCLINE HYCLATE 100 MG PO CAPS: 100.0000 mg | ORAL_CAPSULE | Freq: Two times a day (BID) | ORAL | 0 refills | Status: DC

## 2023-11-27 MED ORDER — AMOXICILLIN-POT CLAVULANATE 875-125 MG PO TABS
1.0000 | ORAL_TABLET | Freq: Two times a day (BID) | ORAL | 0 refills | Status: DC
Start: 2023-11-27 — End: 2023-11-27

## 2023-11-27 MED ORDER — PREDNISONE 20 MG PO TABS: 40.0000 mg | ORAL_TABLET | Freq: Every day | ORAL | 0 refills | Status: AC

## 2023-11-27 NOTE — Discharge Instructions (Addendum)
 You were seen today for asthma exacerbation, and possible pneumonia.  I have sent out two oral antibiotics and steroid to treat you today.  Your blood sugars will likely increase with the steroid so please be watchful.  I am awaiting the results on your xray from the radiologist.  I will call you as soon as those are resulted if there is any change in treatment.  If you have worsening symptoms despite treatment then please go to the ER for further evaluation.

## 2023-11-27 NOTE — ED Provider Notes (Signed)
 [facility]    CSN: [id number] Arrival date & time: 11/27/23  9165      History   Chief Complaint Chief Complaint  Patient presents with   Cough    HPI [name] is a 58 y.o. female.    Cough Associated symptoms: rhinorrhea, shortness of breath and wheezing   Patient is here for cough, wheezing, sob x 2 weeks.  H/o asthma.  She has been using her inhalers with some help.  She also has a nebulizer at home.  She last used her nebulizer about 6am.  No fevers/chills.       Past Medical History:  Diagnosis Date   Anxiety and depression 09/13/2006   Qualifier: Diagnosis of  By: Gladis [name], Nykedtra     Arthritis    joint pain    Asthma    CHF (congestive heart failure) (HCC)    COPD (chronic obstructive pulmonary disease) (HCC)    chronic bronchitis    Depression    Diabetes mellitus    since age 89; type 2 IDDM   Diabetic neuropathy, painful (HCC)    FEET AND HANDS   Diabetic retinopathy (HCC)    Diastolic dysfunction    a.  Echo 11/17: EF 60-65%, mild LVH, no RWMA, Gr1DD, mild AI, dilated aortic root measuring 38 mm, mildly dilated ascending aorta   Dilated aortic root    38mm by echo 11/2015   Dizziness    secondary to diabetes and hypertension    Epilepsy idiopathic petit mal (HCC) 09/13/2006   last seizure 2012;controlled w/ topomax   GERD (gastroesophageal reflux disease)    Glaucoma    NOT ON ANY EYE DROPS    Headache(784.0)    migraines    Heart murmur    born with    History of stress test    a. 11/17: Normal perfusion, EF 53%, normal study   Hx of blood clots    hematomas removed from left side of brain from 11mos to 58yrs old    Hyperlipidemia    Hypertension    Hypertensive urgency 04/23/2021   Legally blind    left eye    MIGRAINE HEADACHE 09/14/2006   Qualifier: Diagnosis of  By: Gladis [name], Nykedtra     Myocardial infarction [facility])    a.  Patient reported history of without objective documentation   Nephrolithiasis     frequent urination , urination at nite  PT SEEN IN ER 09/22/11 FOR BACK AND RT SIDED PAIN--HAS KNOWN STONE RT URETER AND UA IN ER SHOWED UTI   Pain 09/23/2011   LOWER BACK AND RIGHT SIDE--PT HAS RIGHT URETERAL STONE   Pneumonia    hx of 2009   Pyelonephritis    SBO (small bowel obstruction) (HCC) 04/23/2021   Seizures (HCC)    Sleep apnea    [facility];does not use Cpap ; mild   Small bowel obstruction (HCC) 05/18/2021   Stress incontinence    Stroke (HCC)    last 2003  RESIDUAL LEFT LEG WEAKNESS--NO OTHER RESIDUAL PROBLEMS   Syncope 07/07/2021    Patient Active Problem List   Diagnosis Date Noted   Pain management contract discussed 10/14/2023   Acute shoulder pain due to trauma, left 10/14/2023   Hypoalbuminemia due to protein-calorie malnutrition 09/17/2023   UTI (urinary tract infection) 09/16/2023   Polypharmacy 09/09/2023   Closed displaced fracture of fifth metatarsal bone of left foot 07/27/2023   History of falling 07/25/2023   Obesity,  Class II, BMI 35-39.9 03/15/2023   Unspecified visual loss 01/20/2023   Insomnia, unspecified 01/20/2023   Gastro-esophageal reflux disease without esophagitis 04/23/2021   Chronic heart failure with preserved ejection fraction (HFpEF) (HCC) 04/23/2021   Cervical spondylosis 02/12/2021   Lumbar facet arthropathy 02/12/2021   Adrenal adenoma, right    PAD (peripheral artery disease) 01/24/2020   Type 2 diabetes mellitus with hyperglycemia (HCC) 05/16/2019   Athscl heart disease of native coronary artery w/o ang pctrs 04/27/2019   History of CVA (cerebrovascular accident) 07/08/2018   Vitamin D  deficiency 01/24/2018   Overactive bladder 05/18/2017   Dilated aortic root    Sacroiliac dysfunction 10/09/2014   Hemiparesis affecting left side as late effect of stroke (HCC) 09/03/2014   OSA (obstructive sleep apnea) 05/15/2014   Dyslipidemia associated with type 2 diabetes mellitus (HCC) 01/21/2014   Essential  hypertension, benign 01/21/2014   Chronic pain syndrome 01/21/2014   Headache disorder 09/07/2013   Mixed urge and stress incontinence 08/25/2011   BLINDNESS, LEGAL, [place]  DEFINITION 09/14/2006   Mixed hyperlipidemia 09/13/2006   Mild episode of recurrent major depressive disorder 09/13/2006   Epilepsy idiopathic petit mal (HCC) 09/13/2006    Past Surgical History:  Procedure Laterality Date   BRAIN HEMATOMA EVACUATION     five procedures total, first procedure when 5 months old, last at 58 years of age.   CYSTOSCOPY W/ RETROGRADES  09/24/2011   Procedure: CYSTOSCOPY WITH RETROGRADE PYELOGRAM;  Surgeon: Norleen JINNY Seltzer, MD;  Location: [facility];  Service: Urology;  Laterality: Right;   CYSTOSCOPY WITH URETEROSCOPY  09/24/2011   Procedure: CYSTOSCOPY WITH URETEROSCOPY;  Surgeon: Norleen JINNY Seltzer, MD;  Location: [facility];  Service: Urology;  Laterality: Right;  Balloon dilation right ureter    CYSTOSCOPY/RETROGRADE/URETEROSCOPY  08/24/2011   Procedure: CYSTOSCOPY/RETROGRADE/URETEROSCOPY;  Surgeon: Toribio Neysa Repine, MD;  Location: [facility];  Service: Urology;  Laterality: Right;  Cysto, Right retrograde Pyelogram, right stent placement.    INCISIONAL HERNIA REPAIR  05/05/2011   Procedure: LAPAROSCOPIC INCISIONAL HERNIA;  Surgeon: Donnice Bury, MD;  Location: [facility] OR;  Service: General;  Laterality: N/A;   kidney stone removal     LAPAROTOMY N/A 06/12/2021   Procedure: EXPLORATORY LAPAROTOMY, LYSIS OF ADHESIONS;  Surgeon: Desiderio Schanz, MD;  Location: [facility];  Service: General;  Laterality: N/A;   MASS EXCISION  05/05/2011   Procedure: EXCISION MASS;  Surgeon: Donnice Bury, MD;  Location: [facility] OR;  Service: General;  Laterality: Right;   [facility]     RETINAL DETACHMENT SURGERY Left 1990   RIGHT/LEFT HEART CATH AND CORONARY ANGIOGRAPHY N/A 04/28/2019   Procedure: RIGHT/LEFT HEART CATH AND CORONARY ANGIOGRAPHY;  Surgeon: Florencio Cara BIRCH, MD;  Location: [facility];  Service: Cardiovascular;   Laterality: N/A;   SHUNT REMOVAL     shunt inserted at age 10 removed at age 26    VAGINAL HYSTERECTOMY  57    OB History     Gravida  3   Para  2   Term  2   Preterm      AB  1   Living  2      SAB  1   IAB      Ectopic      Multiple      Live Births  2            Home Medications    Prior to Admission medications   Medication Sig Start Date End Date Taking? Authorizing Provider  oxyCODONE -acetaminophen  (PERCOCET/ROXICET)  5-325 MG tablet Take 1 tablet by mouth every 6 (six) hours as needed for severe pain (pain score 7-10).   Yes [provider]  ACCU-CHEK GUIDE TEST test strip USE 1 STRIP IN THE MORNING, AT  NOON, AND AT BEDTIME 11/01/23   Dartha Ernst, MD  albuterol  (ACCUNEB ) 1.25 MG/3ML nebulizer solution USE 1 VIAL VIA NEBULIZER TWICE  DAILY 10/26/23   [name], [name], [name]  albuterol  (VENTOLIN  HFA) 108 (90 Base) MCG/ACT inhaler USE 1 TO 2 INHALATIONS BY MOUTH  EVERY 6 HOURS AS NEEDED 08/23/23   [name], [name], [name]  amitriptyline  (ELAVIL ) 25 MG tablet Take 2-3 tablets (50-75 mg total) by mouth at bedtime. 11/23/23 05/21/24  [name], [name], NP  amLODipine  (NORVASC ) 10 MG tablet Take 1 tablet (10 mg total) by mouth daily. 10/14/23   [name], [name], [name]  butalbital -acetaminophen -caffeine  (FIORICET ) 50-325-40 MG tablet Take 1-2 tablets by mouth every 6 (six) hours as needed for headache. 04/26/23 04/25/24  Marcelino Nurse, MD  carvedilol  (COREG ) 25 MG tablet Take 25 mg by mouth 2 (two) times daily. 03/06/21   [provider]  cholecalciferol  (VITAMIN D3) 25 MCG (1000 UNIT) tablet Take 1 tablet (1,000 Units total) by mouth daily. 11/02/22   [name], [name], [name]  cloNIDine  (CATAPRES ) 0.1 MG tablet Take 0.1 mg by mouth daily as needed. 09/27/23   [provider]  clopidogrel  (PLAVIX ) 75 MG tablet Take 1 tablet (75 mg total) by mouth daily. 10/10/19   [name], MD  Continuous Glucose Sensor (FREESTYLE LIBRE 3 PLUS SENSOR) MISC Inject 1 Device  into the skin continuous. Change every 14 days 10/12/23   [name], [name], [name]  Continuous Glucose Sensor (FREESTYLE LIBRE 3 SENSOR) MISC 1 Device by Does not apply route continuous. 09/25/22   [name], [name], MD  cycloSPORINE  (RESTASIS ) 0.05 % ophthalmic emulsion Place 2 drops into both eyes 2 (two) times daily.    [provider]  Dulaglutide  (TRULICITY ) 4.5 MG/0.5ML SOAJ Inject 4.5 mg as directed once a week. 11/01/23   [name], [name], MD  DULoxetine  (CYMBALTA ) 60 MG capsule Take 1 capsule (60 mg total) by mouth daily. 10/14/23   Gareth Mliss FALCON, [name]  EMBECTA PEN NEEDLE NANO 2 GEN 32G X 4 MM MISC USE 1 PENNEEDLE IN THE MORNING  AT NOON IN THE EVENING AND AT  BEDTIME 10/25/23   [name], [name], MD  EPINEPHrine  0.3 mg/0.3 mL IJ SOAJ injection INJECT 1 PEN INTRAMUSCULARLY AS  NEEDED FOR ALLERGIC RESPONSE AS  DIRECTED BY MD. [facility]  HELP AFTER USE 10/26/23   [name], [name], [name]  ezetimibe  (ZETIA ) 10 MG tablet Take 1 tablet (10 mg total) by mouth daily. 10/14/23   Gareth Mliss FALCON, [name]  furosemide  (LASIX ) 20 MG tablet Take 1 tablet (20 mg total) by mouth daily. 10/14/23   [name], [name], [name]  gabapentin  (NEURONTIN ) 600 MG tablet 600 mg qAM, 600 mg qPM, 1200 mg QHS 11/18/23   [name], [name], NP  GVOKE HYPOPEN  2-PACK 1 MG/0.2ML SOAJ INJECT SUBCUTANEOUSLY 1MG  ONCE  AS NEEDED FOR SEVERE LOW BLOOD  SUGAR NOT RESPONDING TO DRINKING JUICE/GLUCOSE TABS, WHEN BG &lt; 70 10/25/23   [name], [name], MD  hydrALAZINE  (APRESOLINE ) 50 MG tablet Take 1 tablet by mouth 3 (three) times daily. 05/22/22   [provider]  insulin  glargine, 2 Unit Dial , (TOUJEO  MAX SOLOSTAR) 300 UNIT/ML Solostar Pen Inject 50 Units into the skin at bedtime. Plus sliding scale, max dose 80 units a day Patient taking differently: Inject 80 Units  into the skin at bedtime. Plus sliding scale, max dose 80 units a day 06/15/23   Dartha Ernst, MD  insulin  lispro (HUMALOG  KWIKPEN) 100 UNIT/ML KwikPen Inject 15 Units into the skin 3 (three)  times daily. Plus Sliding scale, max dose 80 units a day 06/15/23   Dartha Ernst, MD  levETIRAcetam  (KEPPRA ) 500 MG tablet Take 500-750 mg by mouth See admin instructions. Take 1 tablet (500mg ) by mouth every morning and take 1 tablets by mouth (750mg ) by mouth every night    [provider]  meclizine  (ANTIVERT ) 25 MG tablet Take 1 tablet (25 mg total) by mouth 3 (three) times daily as needed for dizziness. 06/16/22   [name], MD  mirabegron  ER (MYRBETRIQ ) 50 MG TB24 tablet Take 1 tablet (50 mg total) by mouth daily. 10/14/23   [name], [name], [name]  nitroGLYCERIN  (NITROSTAT ) 0.4 MG SL tablet Place 0.4 mg under the tongue every 5 (five) minutes as needed for chest pain. 06/10/23   [provider]  olmesartan -hydrochlorothiazide  (BENICAR  HCT) 40-12.5 MG tablet Take 1 tablet by mouth daily. 10/20/23   Sowles, Krichna, MD  pantoprazole  (PROTONIX ) 40 MG tablet TAKE 1 TABLET BY MOUTH DAILY AS  NEEDED 07/30/23   [name], [name], [name]  potassium chloride  SA (KLOR-CON  M) 20 MEQ tablet Take 40 mEq by mouth. 10/04/23   [provider]  rosuvastatin  (CRESTOR ) 40 MG tablet TAKE 1 TABLET BY MOUTH DAILY 08/17/23   [name], [name], MD  tiZANidine  (ZANAFLEX ) 4 MG tablet Take 1 tablet (4 mg total) by mouth every 8 (eight) hours as needed for muscle spasms. 11/18/23   [name], [name], NP  Ubrogepant  (UBRELVY ) 50 MG TABS 50 mg or 100 mg taken orally with or without food. If needed, a second dose may be taken at least 2 hours after the initial dose. The maximum dose in a 24-hour period is 200 mg 02/18/23   Marcelino Nurse, MD  WIXELA INHUB 250-50 MCG/DOSE AEPB Inhale 1 puff into the lungs 2 (two) times daily. 01/22/20   [provider]    Family History Family History  Problem Relation Age of Onset   Uterine cancer Mother    Hypertension Mother    Cancer Mother    Brain cancer Maternal Grandmother    Hypertension Maternal Grandmother    Birth defects Daughter    Hypertension Daughter     Cirrhosis Maternal Grandfather    ADD / ADHD Maternal Grandfather    Birth defects Maternal Grandfather    Diabetes Maternal Grandfather    Anesthesia problems Neg Hx    Colon cancer Neg Hx    Esophageal cancer Neg Hx    Pancreatic cancer Neg Hx     Social History Social History   Tobacco Use   Smoking status: Former    Current packs/day: 0.00    Average packs/day: 1 pack/day for 15.0 years (15.0 ttl pk-yrs)    Types: Cigarettes    Start date: 01/20/1972    Quit date: 01/20/1987    Years since quitting: 36.8   Smokeless tobacco: Never   Tobacco comments:    quit more than 20 years  Vaping Use   Vaping status: Never Used  Substance Use Topics   Alcohol  use: Not Currently    Alcohol /week: 1.0 standard drink of alcohol     Types: 1 Glasses of wine per week    Comment: occ   Drug use: No    Comment: hx of marijuana use no longer uses  Allergies   Codeine, Farxiga  [dapagliflozin ], and Lactose intolerance (gi)   Review of Systems Review of Systems  Constitutional: Negative.   HENT:  Positive for congestion, rhinorrhea and sinus pressure.   Respiratory:  Positive for cough, shortness of breath and wheezing.   Cardiovascular: Negative.   Gastrointestinal: Negative.   Genitourinary: Negative.   Musculoskeletal: Negative.   Psychiatric/Behavioral: Negative.       Physical Exam Triage Vital Signs ED Triage Vitals  Encounter Vitals Group     BP 11/27/23 0919 (!) 158/101     Girls Systolic BP Percentile --      Girls Diastolic BP Percentile --      Boys Systolic BP Percentile --      Boys Diastolic BP Percentile --      Pulse Rate 11/27/23 0919 88     Resp 11/27/23 0919 16     Temp 11/27/23 0919 98.9 F (37.2 C)     Temp Source 11/27/23 0919 Oral     SpO2 11/27/23 0919 94 %     Weight --      Height --      Head Circumference --      Peak Flow --      Pain Score 11/27/23 0917 0     Pain Loc --      Pain Education --      Exclude from Growth Chart --     No data found.  Updated Vital Signs BP (!) 158/101 (BP Location: Right Arm)   Pulse 88   Temp 98.9 F (37.2 C) (Oral)   Resp 16   LMP  (LMP Unknown)   SpO2 94%   Visual Acuity Right Eye Distance:   Left Eye Distance:   Bilateral Distance:    Right Eye Near:   Left Eye Near:    Bilateral Near:     Physical Exam Constitutional:      General: She is not in acute distress.    Appearance: Normal appearance. She is normal weight. She is not ill-appearing or toxic-appearing.  HENT:     Nose:     Right Sinus: Maxillary sinus tenderness present.     Left Sinus: Maxillary sinus tenderness present.     Mouth/Throat:     Mouth: Mucous membranes are moist.  Cardiovascular:     Rate and Rhythm: Normal rate and regular rhythm.  Pulmonary:     Effort: Pulmonary effort is normal.     Breath sounds: Normal breath sounds. No wheezing or rhonchi.  Musculoskeletal:     Cervical back: Normal range of motion and neck supple.  Skin:    General: Skin is warm.  Neurological:     General: No focal deficit present.     Mental Status: She is alert.  Psychiatric:        Mood and Affect: Mood normal.      UC Treatments / Results  Labs (all labs ordered are listed, but only abnormal results are displayed) Labs Reviewed - No data to display  EKG   Radiology DG Chest 2 View Result Date: 11/27/2023 EXAM: 2 VIEW(S) XRAY OF THE CHEST 11/27/2023 10:22:03 AM COMPARISON: 07/25/2023 CLINICAL HISTORY: cough and wheezing FINDINGS: LUNGS AND PLEURA: Stable somewhat low lung volumes. No focal pulmonary opacity. No pulmonary edema. No pleural effusion. No pneumothorax. HEART AND MEDIASTINUM: No acute abnormality of the cardiac and mediastinal silhouettes. BONES AND SOFT TISSUES: No acute fracture or destructive lesion. Multilevel thoracic osteophytosis. IMPRESSION: 1. No acute cardiopulmonary process. Electronically  signed by: Helayne Hurst MD 11/27/2023 10:37 AM EST RP Workstation: [id number]     Procedures Procedures (including critical care time)  Medications Ordered in UC Medications - No data to display  Initial Impression / Assessment and Plan / UC Course  I have reviewed the triage vital signs and the nursing notes.  Pertinent labs & imaging results that were available during my care of the patient were reviewed by me and considered in my medical decision making (see chart for details).   Final Clinical Impressions(s) / UC Diagnoses   Final diagnoses:  Mild intermittent asthma with acute exacerbation  Acute non-recurrent maxillary sinusitis     Discharge Instructions      You were seen today for asthma exacerbation, and possible pneumonia.  I have sent out two oral antibiotics and steroid to treat you today.  Your blood sugars will likely increase with the steroid so please be watchful.  I am awaiting the results on your xray from the radiologist.  I will call you as soon as those are resulted if there is any change in treatment.  If you have worsening symptoms despite treatment then please go to the ER for further evaluation.      ED Prescriptions     Medication Sig Dispense Auth. Provider   doxycycline  (VIBRAMYCIN ) 100 MG capsule Take 1 capsule (100 mg total) by mouth 2 (two) times daily. 20 capsule Evyn Putzier, MD   predniSONE  (DELTASONE ) 20 MG tablet Take 2 tablets (40 mg total) by mouth daily for 5 days. 10 tablet Donica Derouin, MD   amoxicillin-clavulanate (AUGMENTIN) 875-125 MG tablet  (Status: Discontinued) Take 1 tablet by mouth every 12 (twelve) hours. 14 tablet Darral Longs, MD      PDMP not reviewed this encounter.   Radiology result read xray as normal.  As a result, will cancel the augmentin, and just fill the doxy and prednisone .  Pharmacy and patient have been made aware.    Darral Longs, MD 11/27/23 [phone]

## 2023-11-27 NOTE — ED Triage Notes (Signed)
 Patient here today with c/o cough, SOB, and wheeze X 2 weeks. Patient has a h/o asthma. Patient has been taking Theraflu and cough drops with some relief. Her daughter has a URI.

## 2023-11-29 ENCOUNTER — Ambulatory Visit: Admitting: Family Medicine

## 2023-11-29 NOTE — Telephone Encounter (Signed)
 Requested Prescriptions  Pending Prescriptions Disp Refills   albuterol  (VENTOLIN  HFA) 108 (90 Base) MCG/ACT inhaler [Pharmacy Med Name: ALBUTEROL  HFA 90MCG/ACT (PA)] 34 g 2    Sig: USE 1 TO 2 INHALATIONS BY MOUTH  EVERY 6 HOURS AS NEEDED     Pulmonology:  Beta Agonists 2 Failed - 11/29/2023  4:27 PM      Failed - Last BP in normal range    BP Readings from Last 1 Encounters:  11/27/23 (!) 158/101         Passed - Last Heart Rate in normal range    Pulse Readings from Last 1 Encounters:  11/27/23 88         Passed - Valid encounter within last 12 months    Recent Outpatient Visits           1 month ago Slurred speech   [facility] Gareth Mliss FALCON, [name]   3 months ago Encounter for support and coordination of transition of care   [facility] [name], [name]   5 months ago Uncontrolled hypertension   [facility] Glenard Mire, MD   8 months ago AKI (acute kidney injury)   [facility] [name], [name]   11 years ago HYPERTENSION   [facility] Rai, Nydia POUR, MD

## 2023-12-06 ENCOUNTER — Ambulatory Visit: Attending: Nurse Practitioner | Admitting: Nurse Practitioner

## 2023-12-06 ENCOUNTER — Encounter: Payer: Self-pay | Admitting: Nurse Practitioner

## 2023-12-06 VITALS — BP 104/63 | HR 90 | Temp 97.2°F | Resp 18 | Ht 65.0 in | Wt 249.0 lb

## 2023-12-06 DIAGNOSIS — M25551 Pain in right hip: Secondary | ICD-10-CM | POA: Insufficient documentation

## 2023-12-06 DIAGNOSIS — E1142 Type 2 diabetes mellitus with diabetic polyneuropathy: Secondary | ICD-10-CM | POA: Insufficient documentation

## 2023-12-06 DIAGNOSIS — G894 Chronic pain syndrome: Secondary | ICD-10-CM | POA: Diagnosis present

## 2023-12-06 DIAGNOSIS — Z79899 Other long term (current) drug therapy: Secondary | ICD-10-CM | POA: Insufficient documentation

## 2023-12-06 DIAGNOSIS — G890 Central pain syndrome: Secondary | ICD-10-CM | POA: Insufficient documentation

## 2023-12-06 DIAGNOSIS — M25552 Pain in left hip: Secondary | ICD-10-CM | POA: Insufficient documentation

## 2023-12-06 DIAGNOSIS — Z794 Long term (current) use of insulin: Secondary | ICD-10-CM

## 2023-12-06 MED ORDER — OXYCODONE-ACETAMINOPHEN 5-325 MG PO TABS: 1.0000 | ORAL_TABLET | Freq: Three times a day (TID) | ORAL | 0 refills | Status: AC | PRN

## 2023-12-06 NOTE — Progress Notes (Signed)
 Nursing Pain Medication Assessment:  Safety precautions to be maintained throughout the outpatient stay will include: orient to surroundings, keep bed in low position, maintain call bell within reach at all times, provide assistance with transfer out of bed and ambulation.  Medication Inspection Compliance: Pill count conducted under aseptic conditions, in front of the patient. Neither the pills nor the bottle was removed from the patient's sight at any time. Once count was completed pills were immediately returned to the patient in their original bottle.  Medication: Oxycodone /APAP Pill/Patch Count: 9 of 90 pills/patches remain Pill/Patch Appearance: Markings consistent with prescribed medication Bottle Appearance: Standard pharmacy container. Clearly labeled. Filled Date: 09 / 25 / 2025 Last Medication intake:  Today

## 2023-12-06 NOTE — Progress Notes (Signed)
 History of present illness (HPI) [name], a 58 y.o. year old female, is here today because of her Chronic pain syndrome [G89.4]. [name] primary complain today is Back Pain and left leg pain.   Pertinent problems: [name] has Obesity, Class III, BMI 40-49.9 (morbid obesity) (HCC); Dyslipidemia associated with type 2 diabetes mellitus (HCC); Chronic pain syndrome; Diabetic polyneuropathy associated with type 2 diabetes mellitus (HCC); and CAD (coronary artery disease) on their pertinent problem list.   Pain Assessment: Severity of Chronic pain is reported as a 7 /10. Location: Back Lower/From Lower Back in hips and down bilateral legs. Onset: More than a month ago. Quality: Aching, Constant, Sharp. Timing: Constant. Modifying factor(s): Pain medication, walking and heating pad. Vitals:  height is 5' 5 (1.651 m) and weight is 249 lb (112.9 kg). Her temporal temperature is 97.2 F (36.2 C) (abnormal). Her blood pressure is 104/63 and her pulse is 90. Her respiration is 18 and oxygen saturation is 96%.  BMI: Estimated body mass index is 41.44 kg/m as calculated from the following:   Height as of this encounter: 5' 5 (1.651 m).   Weight as of this encounter: 249 lb (112.9 kg).  Last encounter: 11/18/2023. Last procedure: Visit date not found.  Reason for encounter: medication management. No change in medical history since last visit.  Patient's pain is at baseline.  Patient continues multimodal pain regimen as prescribed.  States that it provides pain relief and improvement in functional status.   Discussed the use of AI scribe software for clinical note transcription with the patient, who gave verbal consent to proceed.  History of Present Illness   [name] is a 58 year old female who presents for medication management of chronic pain.  She experiences chronic pain primarily in her back and left leg, with the left leg pain persisting since  August.  She is currently taking oxycodone  5 - 325 mg as needed, typically once a day, but may take it up to three times a day if necessary. No side effects or adverse reactions from the medication, including constipation. She manages her pain with Percocet, which she finds more effective than previous medications. Although prescribed every eight hours, she does not require it that frequently, and a 30-day prescription lasts about two and a half months.     Pharmacotherapy Assessment   Oxycodone -acetaminophen  (Percocet) 5-325 mg tablet every 8 hours as needed for shoulder and low back pain.  Monitoring:  PMP: PDMP reviewed during this encounter.       Pharmacotherapy: No side-effects or adverse reactions reported. Compliance: No problems identified. Effectiveness: Clinically acceptable.  [name], [place]  12/06/2023  9:21 AM  Sign when Signing Visit Nursing Pain Medication Assessment:  Safety precautions to be maintained throughout the outpatient stay will include: orient to surroundings, keep bed in low position, maintain call bell within reach at all times, provide assistance with transfer out of bed and ambulation.  Medication Inspection Compliance: Pill count conducted under aseptic conditions, in front of the patient. Neither the pills nor the bottle was removed from the patient's sight at any time. Once count was completed pills were immediately returned to the patient in their original bottle.  Medication: Oxycodone /APAP Pill/Patch Count: 9 of 90 pills/patches remain Pill/Patch Appearance: Markings consistent with prescribed medication Bottle Appearance: Standard pharmacy container. Clearly labeled. Filled Date: 09 / 25 / 2025 Last Medication intake:  Today    UDS:  Summary  Date Value  Ref Range Status  10/14/2023 FINAL  Final    Comment:    ==================================================================== [facility]  (MW) ==================================================================== Test                             Result       Flag       Units  Drug Absent but Declared for Prescription Verification   Oxycodone                       Not Detected UNEXPECTED ng/mg creat   Butalbital                      Not Detected UNEXPECTED ==================================================================== Test                      Result    Flag   Units      Ref Range   Creatinine              72               mg/dL      >=79 ==================================================================== Declared Medications:  The flagging and interpretation on this report are based on the  following declared medications.  Unexpected results may arise from  inaccuracies in the declared medications.   **Note: The testing scope of this panel includes these medications:   Butalbital  (Fioricet )  Oxycodone  (Percocet)   **Note: The testing scope of this panel does not include the  following reported medications:   Acetaminophen  (Fioricet )  Acetaminophen  (Percocet)  Albuterol  (AccuNeb )  Amitriptyline  (Elavil )  Amlodipine  (Norvasc )  Caffeine  (Fioricet )  Carvedilol  (Coreg )  Clonidine  (Catapres )  Clopidogrel  (Plavix )  Cyclosporine  (Restasis )  Dulaglutide  (Trulicity )  Duloxetine  (Cymbalta )  Epinephrine  (EpiPen )  Ezetimibe  (Zetia )  Fluticasone  (Wixela Inhub)  Furosemide  (Lasix )  Gabapentin  (Neurontin )  Glucagon  (Gvoke)  Hydralazine  (Apresoline )  Insulin  (Toujeo )  Levetiracetam  (Keppra )  Meclizine  (Antivert )  Mirabegron  (Myrbetriq )  Nitroglycerin  (Nitrostat )  Olmesartan  (Benicar )  Pantoprazole  (Protonix )  Potassium (Klor-Con )  Rosuvastatin  (Crestor )  Salmeterol (Wixela Inhub)  Tizanidine  (Zanaflex )  Ubrogepant  (Ubrelvy )  Vitamin D3 ==================================================================== For clinical consultation, please call [phone]. ====================================================================     No results found for: CBDTHCR No results found for: D8THCCBX No results found for: D9THCCBX  ROS  Constitutional: Denies any fever or chills Gastrointestinal: No reported hemesis, hematochezia, vomiting, or acute GI distress Musculoskeletal: Low back pain, left leg pain  Neurological: No reported episodes of acute onset apraxia, aphasia, dysarthria, agnosia, amnesia, paralysis, loss of coordination, or loss of consciousness  Medication Review  DULoxetine , Dulaglutide , EPINEPHrine , FreeStyle Libre 3 Plus Sensor, FreeStyle Libre 3 Sensor, Glucagon , Insulin  Pen Needle, Ubrogepant , albuterol , amLODipine , amitriptyline , butalbital -acetaminophen -caffeine , carvedilol , cholecalciferol , cloNIDine , clopidogrel , cycloSPORINE , doxycycline , ezetimibe , fluticasone , fluticasone -salmeterol, furosemide , gabapentin , glucose blood, hydrALAZINE , insulin  glargine (2 Unit Dial ), insulin  lispro, levETIRAcetam , meclizine , mirabegron  ER, nitroGLYCERIN , olmesartan -hydrochlorothiazide , oxyCODONE -acetaminophen , pantoprazole , potassium chloride  SA, rosuvastatin , and tiZANidine   History Review  Allergy: [name] is allergic to codeine, farxiga  [dapagliflozin ], and lactose intolerance (gi). Drug: [name]  reports no history of drug use. Alcohol :  reports that she does not currently use alcohol  after a past usage of about 1.0 standard drink of alcohol  per week. Tobacco:  reports that she quit smoking about 36 years ago. Her smoking use included cigarettes. She started smoking about 51 years ago. She has a 15 pack-year smoking history. She has never used smokeless tobacco. Social: [name]  reports that she  quit smoking about 36 years ago. Her smoking use included cigarettes. She started smoking about 51 years ago. She has a 15 pack-year smoking history. She has never used smokeless tobacco. She reports that she does not currently  use alcohol  after a past usage of about 1.0 standard drink of alcohol  per week. She reports that she does not use drugs. Medical:  has a past medical history of Anxiety and depression (09/13/2006), Arthritis, Asthma, CHF (congestive heart failure) (HCC), COPD (chronic obstructive pulmonary disease) (HCC), Depression, Diabetes mellitus, Diabetic neuropathy, painful (HCC), Diabetic retinopathy (HCC), Diastolic dysfunction, Dilated aortic root, Dizziness, Epilepsy idiopathic petit mal (HCC) (09/13/2006), GERD (gastroesophageal reflux disease), Glaucoma, Headache(784.0), Heart murmur, History of stress test, blood clots, Hyperlipidemia, Hypertension, Hypertensive urgency (04/23/2021), Legally blind, MIGRAINE HEADACHE (09/14/2006), Myocardial infarction [facility]), Nephrolithiasis, Pain (09/23/2011), Pneumonia, Pyelonephritis, SBO (small bowel obstruction) (HCC) (04/23/2021), Seizures (HCC), Sleep apnea, Small bowel obstruction (HCC) (05/18/2021), Stress incontinence, Stroke (HCC), and Syncope (07/07/2021). Surgical: [name]  has a past surgical history that includes kidney stone removal; Brain hematoma evacuation; Shunt removal; Retinal detachment surgery (Left, 1990); Vaginal hysterectomy (1996); Incisional hernia repair (05/05/2011); Mass excision (05/05/2011); PCNL; Cystoscopy/retrograde/ureteroscopy (08/24/2011); Cystoscopy with ureteroscopy (09/24/2011); Cystoscopy w/ retrogrades (09/24/2011); RIGHT/LEFT HEART CATH AND CORONARY ANGIOGRAPHY (N/A, 04/28/2019); and laparotomy (N/A, 06/12/2021). Family: family history includes ADD / ADHD in her maternal grandfather; Birth defects in her daughter and maternal grandfather; Brain cancer in her maternal grandmother; Cancer in her mother; Cirrhosis in her maternal grandfather; Diabetes in her maternal grandfather; Hypertension in her daughter, maternal grandmother, and mother; Uterine cancer in her mother.  Laboratory Chemistry Profile   Renal Lab Results  Component Value Date    BUN 16 09/17/2023   CREATININE 0.83 09/17/2023   LABCREA 25 11/17/2021   BCR SEE NOTE: 08/27/2023   GFR 74.92 04/14/2022   GFRAA >60 05/30/2019   GFRNONAA >60 09/17/2023    Hepatic Lab Results  Component Value Date   AST 18 09/17/2023   ALT 17 09/17/2023   ALBUMIN  3.1 (L) 09/17/2023   ALKPHOS 79 09/17/2023   LIPASE 45 06/12/2023    Electrolytes Lab Results  Component Value Date   NA 140 09/17/2023   K 3.8 09/17/2023   CL 107 09/17/2023   CALCIUM  8.7 (L) 09/17/2023   MG 2.1 09/17/2023   PHOS 2.8 09/17/2023    Bone Lab Results  Component Value Date   VD25OH 33 11/17/2021    Inflammation (CRP: Acute Phase) (ESR: Chronic Phase) Lab Results  Component Value Date   CRP 16.7 (H) 06/14/2021   ESRSEDRATE 65 (H) 06/14/2021   LATICACIDVEN 1.6 08/03/2022         Note: Above Lab results reviewed.  Recent Imaging Review  DG Chest 2 View EXAM: 2 VIEW(S) XRAY OF THE CHEST 11/27/2023 10:22:03 AM  COMPARISON: 07/25/2023  CLINICAL HISTORY: cough and wheezing  FINDINGS:  LUNGS AND PLEURA: Stable somewhat low lung volumes. No focal pulmonary opacity. No pulmonary edema. No pleural effusion. No pneumothorax.  HEART AND MEDIASTINUM: No acute abnormality of the cardiac and mediastinal silhouettes.  BONES AND SOFT TISSUES: No acute fracture or destructive lesion. Multilevel thoracic osteophytosis.  IMPRESSION: 1. No acute cardiopulmonary process.  Electronically signed by: Helayne Hurst MD 11/27/2023 10:37 AM EST RP Workstation: [id number] Note: Reviewed        Physical Exam  Vitals: BP 104/63 (BP Location: Left Arm, Patient Position: Sitting)   Pulse 90   Temp (!) 97.2 F (36.2 C) (Temporal)   Resp 18  Ht 5' 5 (1.651 m)   Wt 249 lb (112.9 kg)   LMP  (LMP Unknown)   SpO2 96%   BMI 41.44 kg/m  BMI: Estimated body mass index is 41.44 kg/m as calculated from the following:   Height as of this encounter: 5' 5 (1.651 m).   Weight as of this encounter: 249  lb (112.9 kg). Ideal: Ideal body weight: 57 kg (125 lb 10.6 oz) Adjusted ideal body weight: 79.4 kg (175 lb) General appearance: Well nourished, well developed, and well hydrated. In no apparent acute distress Mental status: Alert, oriented x 3 (person, place, & time)       Respiratory: No evidence of acute respiratory distress Eyes: PERLA  Musculoskeletal : +LBP Assessment   Diagnosis Status  1. Chronic pain syndrome   2. Central pain syndrome   3. Bilateral hip pain   4. Medication management   5. Diabetic polyneuropathy associated with type 2 diabetes mellitus (HCC)    Controlled Controlled Controlled   Updated Problems: No problems updated.  Plan of Care  Problem-specific:  Assessment and Plan    Chronic pain syndrome with back and left leg pain Chronic pain managed with oxycodone  5-325 mg, no side effects reported. - Continue oxycodone  5-325 mg as needed, up to three times daily. - Advised to stretch out appointments based on medication usage.  Patient's pain is controlled with oxycodone , will continue on current medication regimen. Prescribing drug monitoring (PDMP) reviewed, findings consistent with the use of prescribe medication and no evidence of narcotic misuse or abuse. No side effects or adverse reaction reported to medication. Schedule follow-up in 60 days for medication management with Emmy Blanch, NP.        Ms. Harlene Vasha Klemz has a current medication list which includes the following long-term medication(s): albuterol , albuterol , amitriptyline , amlodipine , duloxetine , ezetimibe , furosemide , gabapentin , toujeo  max solostar, insulin  lispro, levetiracetam , mirabegron  er, olmesartan -hydrochlorothiazide , pantoprazole , rosuvastatin , and wixela inhub.  Pharmacotherapy (Medications Ordered): Meds ordered this encounter  Medications   oxyCODONE -acetaminophen  (PERCOCET) 5-325 MG tablet    Sig: Take 1 tablet by mouth every 8 (eight) hours as needed for severe  pain (pain score 7-10). Must last 7 days.    Dispense:  90 tablet    Refill:  0    For acute post-operative pain. Not to be refilled. Most last 7 days.   Orders:  No orders of the defined types were placed in this encounter.       Return in about 2 months (around 02/05/2024) for (F2F), (MM), Emmy Blanch NP.    Recent Visits Date Type Provider Dept  11/18/23 Office Visit Millena Callins K, NP [facility]  10/14/23 Office Visit Kristoph Sattler K, NP [facility]  Showing recent visits within past 90 days and meeting all other requirements Today's Visits Date Type Provider Dept  12/06/23 Office Visit Brae Gartman K, NP [facility]  Showing today's visits and meeting all other requirements Future Appointments Date Type Provider Dept  02/01/24 Appointment Shaindy Reader K, NP [facility]  Showing future appointments within next 90 days and meeting all other requirements  I discussed the assessment and treatment plan with the patient. The patient was provided an opportunity to ask questions and all were answered. The patient agreed with the plan and demonstrated an understanding of the instructions.  Patient advised to call back or seek an in-person evaluation if the symptoms or condition worsens.  I personally spent a total of 30 minutes in the  care of the patient today including preparing to see the patient, getting/reviewing separately obtained history, performing a medically appropriate exam/evaluation, counseling and educating, placing orders, referring and communicating with other health care professionals, documenting clinical information in the EHR, independently interpreting results, communicating results, and coordinating care.   Note by: Rosamund Nyland K Marimar Suber, NP (TTS and AI technology used. I apologize for any typographical errors that were not detected and corrected.) Date: 12/06/2023; Time: 11:21 AM

## 2023-12-08 ENCOUNTER — Other Ambulatory Visit: Payer: Self-pay | Admitting: "Endocrinology

## 2023-12-08 DIAGNOSIS — E1165 Type 2 diabetes mellitus with hyperglycemia: Secondary | ICD-10-CM

## 2023-12-11 ENCOUNTER — Encounter: Payer: Self-pay | Admitting: "Endocrinology

## 2023-12-13 ENCOUNTER — Encounter: Payer: Self-pay | Admitting: "Endocrinology

## 2023-12-13 ENCOUNTER — Other Ambulatory Visit

## 2023-12-13 ENCOUNTER — Other Ambulatory Visit: Payer: Self-pay

## 2023-12-13 ENCOUNTER — Ambulatory Visit (INDEPENDENT_AMBULATORY_CARE_PROVIDER_SITE_OTHER): Admitting: "Endocrinology

## 2023-12-13 VITALS — BP 118/80 | HR 84 | Resp 18 | Ht 65.0 in | Wt 255.0 lb

## 2023-12-13 DIAGNOSIS — E162 Hypoglycemia, unspecified: Secondary | ICD-10-CM

## 2023-12-13 DIAGNOSIS — E7800 Pure hypercholesterolemia, unspecified: Secondary | ICD-10-CM

## 2023-12-13 DIAGNOSIS — Z7985 Long-term (current) use of injectable non-insulin antidiabetic drugs: Secondary | ICD-10-CM

## 2023-12-13 DIAGNOSIS — Z794 Long term (current) use of insulin: Secondary | ICD-10-CM

## 2023-12-13 DIAGNOSIS — E1165 Type 2 diabetes mellitus with hyperglycemia: Secondary | ICD-10-CM

## 2023-12-13 LAB — POCT GLYCOSYLATED HEMOGLOBIN (HGB A1C): Hemoglobin A1C: 8.6 % — AB (ref 4.0–5.6)

## 2023-12-13 MED ORDER — FREESTYLE LIBRE 3 PLUS SENSOR MISC: Status: DC

## 2023-12-13 NOTE — Progress Notes (Signed)
 [name]                                          MRN: [id number]   12/13/2023   The [facility] Quality Team Specialist reviewed this patient medical record for the purposes of chart review for care gap closure. The following were reviewed: chart review for care gap closure-kidney health evaluation for diabetes:eGFR  and uACR.    [facility] Quality Team

## 2023-12-13 NOTE — Progress Notes (Signed)
 Outpatient Endocrinology Note Karen Birmingham, MD  12/13/23  [name] 1965/08/18 [id number]  Referring Provider: [name], [name] Primary Care Provider: [name], [name], [name] (Inactive) Reason for consultation: Subjective   Assessment & Plan  Nasrin Lanzo was seen today for follow-up.  Diagnoses and all orders for this visit:  Uncontrolled type 2 diabetes mellitus with hyperglycemia, with long-term current use of insulin  (HCC) -     Microalbumin / creatinine urine ratio; Future -     POCT glycosylated hemoglobin (Hb A1C) -     Lipid panel -     Comprehensive metabolic panel with GFR -     Continuous Glucose Sensor (FREESTYLE LIBRE 3 PLUS SENSOR) MISC; Change sensor every 15 days. -     Microalbumin / creatinine urine ratio  Hypoglycemia  Long-term (current) use of injectable non-insulin  antidiabetic drugs  Long-term insulin  use (HCC)  Pure hypercholesterolemia   Diabetes Type II complicated by CAD, stroke  Lab Results  Component Value Date   GFR 74.92 04/14/2022   Hba1c goal less than 7, current Hba1c is  Lab Results  Component Value Date   HGBA1C 8.6 (A) 12/13/2023   Will recommend the following:  Trulicity  4.5 mg/week  Toujeo  75 units daily Humalog : 17 units before break fast, 13 units before lunch and 15 units before supper: 15 min before meals Humalog  correction scale: Use in addition to your meal time insulin  based on blood sugars as follows:  151 - 175: 1 unit 176 - 200: 2 units 201 - 225: 3 units 226 - 250: 4 units 251 - 275: 5 units 276 - 300: 6 units 301 - 325: 7 units 326 - 350: 8 units 351 - 375: 9 units 376 - 400: 10 units    Libre 3 is covered, Dexcom not covered  Previously, Toujeo  dose changed from 70 to 20 units recently during hospital visit and pt was stopped on Trulicity  4.5 mg weekly due to diarrhea and bloating   06/15/23: 12/13/23 patient has been to the ER twice in the past 3 days because of her high blood  pressure, UTI and high blood sugars.  Instructed patient to go to the ER if her blood pressure continues to stay elevated, which has been checked twice today and is still running 170s/100 and patient reports feeling lightheaded.  Instructed diabetes educator to call the patient tomorrow to follow-up on her and as needed thereafter.  Patient to see me back in a week.  No known contraindications/side effects to any of above medications No history of MEN syndrome/medullary thyroid cancer/pancreatitis or pancreatic cancer in self or family Has Glucagon  -discussed and confirmed with patient  -Last LD and Tg are as follows: Lab Results  Component Value Date   LDLCALC 60 02/16/2023    Lab Results  Component Value Date   TRIG 166 (H) 02/16/2023   -On rosuvastatin  40 mg every day, zetia  10 mg every day  -Follow low fat diet and exercise   -Blood pressure goal <140/90 - Microalbumin/creatinine goal is < 30 -Last MA/Cr is as follows: Lab Results  Component Value Date   MICROALBUR 0.9 11/17/2021   -not on ACE/ARB, uncontrolled blood pressure right now.  Instructed patient to take hydralazine  up to 100 mg 3 times a day as needed, and Coreg  up to 50 mg as needed to keep blood pressure less than 140/90.  If blood pressure continues to stay elevated to go and follow-up with her PCP/ER -diet changes including salt restriction -  limit eating outside -counseled BP targets per standards of diabetes care -uncontrolled blood pressure can lead to retinopathy, nephropathy and cardiovascular and atherosclerotic heart disease  Reviewed and counseled on: -A1C target -Blood sugar targets -Complications of uncontrolled diabetes  -Checking blood sugar before meals and bedtime and bring log next visit -All medications with mechanism of action and side effects -Hypoglycemia management: rule of 15's, Glucagon  Emergency Kit and medical alert ID -low-carb low-fat plate-method diet -At least 20 minutes of physical  activity per day -Annual dilated retinal eye exam and foot exam -compliance and follow up needs -follow up as scheduled or earlier if problem gets worse  Call if blood sugar is less than 70 or consistently above 250    Take a 15 gm snack of carbohydrate at bedtime before you go to sleep if your blood sugar is less than 100.    If you are going to fast after midnight for a test or procedure, ask your physician for instructions on how to reduce/decrease your insulin  dose.    Call if blood sugar is less than 70 or consistently above 250  -Treating a low sugar by rule of 15  (15 gms of sugar every 15 min until sugar is more than 70) If you feel your sugar is low, test your sugar to be sure If your sugar is low (less than 70), then take 15 grams of a fast acting Carbohydrate (3-4 glucose tablets or glucose gel or 4 ounces of juice or regular soda) Recheck your sugar 15 min after treating low to make sure it is more than 70 If sugar is still less than 70, treat again with 15 grams of carbohydrate          Don't drive the hour of hypoglycemia  If unconscious/unable to eat or drink by mouth, use glucagon  injection or nasal spray baqsimi and call 911. Can repeat again in 15 min if still unconscious.  Return in about 6 weeks (around 01/24/2024) for visit, labs today (ONLY URINE), 8 am blood tests before visit .   I have reviewed current medications, nurse's notes, allergies, vital signs, past medical and surgical history, family medical history, and social history for this encounter. Counseled patient on symptoms, examination findings, lab findings, imaging results, treatment decisions and monitoring and prognosis. The patient understood the recommendations and agrees with the treatment plan. All questions regarding treatment plan were fully answered.  Karen Birmingham, MD  12/13/23   History of Present Illness [name] is a 58 y.o. year old female who presents for follow up of Type II  diabetes mellitus.  [name] was first diagnosed in 1989.   Diabetes education +  Home diabetes regimen: Toujeo  80 units daily at night  Humalog : 15 units for all meals 15 min before meals Humalog  correction scale: Use in addition to your meal time insulin  based on blood sugars as follows:  151 - 175: 1 unit 176 - 200: 2 units 201 - 225: 3 units 226 - 250: 4 units 251 - 275: 5 units 276 - 300: 6 units 301 - 325: 7 units 326 - 350: 8 units 351 - 375: 9 units 376 - 400: 10 units   Trulicity  4.5 mg/week   Background history:   She thinks she was initially treated with diet alone and subsequently given metformin With metformin she had nausea and diarrhea but she does not know if she was treated with any other medications until she was started on insulin  in  2003.  Also was on glipizide  in 2015 Most likely she was on Levemir  or Lantus  insulin  for quite some time She was then switched to Toujeo  in 2016 and although she was initially prescribed 90 units twice a day this was subsequently lowered  Her A1c has been occasionally between 7.2 and 7.5 but has been consistently over 9% since 2018  PREVIOUS OMNIPOD PUMP settings with Humalog  U-200:  Basal settings: 12 AM = 2.6, 3 AM = 2.4, 9 AM = 2.7, 3 PM = 3.0 Sensitivity 1: 60 and target 140  Max bolus 6 units Active insulin  time 6 hours  COMPLICATIONS +  MI/Stroke -  retinopathy -  neuropathy -  nephropathy  BLOOD SUGAR DATA CGM interpretation: At today's visit, we reviewed her CGM downloads. The full report is scanned in the media. Reviewing the CGM trends, BG are variable, 44% high, 52% at goal and 4% low.  Lows are happening mainly overnight or in the afternoon, the highs happening mainly in the morning.  Physical Exam  BP 118/80   Pulse 84   Resp 18   Ht 5' 5 (1.651 m)   Wt 255 lb (115.7 kg)   LMP  (LMP Unknown)   SpO2 98%   BMI 42.43 kg/m    Constitutional: well developed, well nourished Head:  normocephalic, atraumatic Eyes: sclera anicteric, no redness Neck: supple Lungs: normal respiratory effort Neurology: alert and oriented Skin: dry, no appreciable rashes Musculoskeletal: no appreciable defects Psychiatric: normal mood and affect Diabetic Foot Exam - Simple   Simple Foot Form Diabetic Foot exam was performed with the following findings: Yes   Visual Inspection No deformities, no ulcerations, no other skin breakdown bilaterally: Yes Sensation Testing Intact to touch and monofilament testing bilaterally: Yes Pulse Check See comments: Yes Comments + dorsalis pedis B/L, + right big toe nail thickening (seen podiatry)      Current Medications Patient's Medications  New Prescriptions   CONTINUOUS GLUCOSE SENSOR (FREESTYLE LIBRE 3 PLUS SENSOR) MISC    Change sensor every 15 days.  Previous Medications   ACCU-CHEK GUIDE TEST TEST STRIP    USE 1 STRIP IN THE MORNING, AT  NOON, AND AT BEDTIME   ALBUTEROL  (ACCUNEB ) 1.25 MG/3ML NEBULIZER SOLUTION    USE 1 VIAL VIA NEBULIZER TWICE  DAILY   ALBUTEROL  (VENTOLIN  HFA) 108 (90 BASE) MCG/ACT INHALER    USE 1 TO 2 INHALATIONS BY MOUTH  EVERY 6 HOURS AS NEEDED   AMITRIPTYLINE  (ELAVIL ) 25 MG TABLET    Take 2-3 tablets (50-75 mg total) by mouth at bedtime.   AMLODIPINE  (NORVASC ) 10 MG TABLET    Take 1 tablet (10 mg total) by mouth daily.   BUTALBITAL -ACETAMINOPHEN -CAFFEINE  (FIORICET ) 50-325-40 MG TABLET    Take 1-2 tablets by mouth every 6 (six) hours as needed for headache.   CARVEDILOL  (COREG ) 25 MG TABLET    Take 25 mg by mouth 2 (two) times daily.   CHOLECALCIFEROL  (VITAMIN D3) 25 MCG (1000 UNIT) TABLET    Take 1 tablet (1,000 Units total) by mouth daily.   CLONIDINE  (CATAPRES ) 0.1 MG TABLET    Take 0.1 mg by mouth daily as needed.   CLOPIDOGREL  (PLAVIX ) 75 MG TABLET    Take 1 tablet (75 mg total) by mouth daily.   CONTINUOUS GLUCOSE SENSOR (FREESTYLE LIBRE 3 PLUS SENSOR) MISC    Inject 1 Device into the skin continuous. Change  every 14 days   CONTINUOUS GLUCOSE SENSOR (FREESTYLE LIBRE 3 SENSOR) MISC    1  Device by Does not apply route continuous.   CYCLOSPORINE  (RESTASIS ) 0.05 % OPHTHALMIC EMULSION    Place 2 drops into both eyes 2 (two) times daily.   DULAGLUTIDE  (TRULICITY ) 4.5 MG/0.5ML SOAJ    Inject 4.5 mg as directed once a week.   DULOXETINE  (CYMBALTA ) 60 MG CAPSULE    Take 1 capsule (60 mg total) by mouth daily.   EMBECTA PEN NEEDLE NANO 2 GEN 32G X 4 MM MISC    USE 1 PENNEEDLE IN THE MORNING  AT NOON IN THE EVENING AND AT  BEDTIME   EPINEPHRINE  0.3 MG/0.3 ML IJ SOAJ INJECTION    INJECT 1 PEN INTRAMUSCULARLY AS  NEEDED FOR ALLERGIC RESPONSE AS  DIRECTED BY MD. [facility]  HELP AFTER USE   EZETIMIBE  (ZETIA ) 10 MG TABLET    Take 1 tablet (10 mg total) by mouth daily.   FLUTICASONE  (FLONASE ) 50 MCG/ACT NASAL SPRAY    Place into both nostrils.   FUROSEMIDE  (LASIX ) 20 MG TABLET    Take 1 tablet (20 mg total) by mouth daily.   GABAPENTIN  (NEURONTIN ) 600 MG TABLET    600 mg qAM, 600 mg qPM, 1200 mg QHS   GVOKE HYPOPEN  2-PACK 1 MG/0.2ML SOAJ    INJECT SUBCUTANEOUSLY 1MG  ONCE  AS NEEDED FOR SEVERE LOW BLOOD  SUGAR NOT RESPONDING TO DRINKING JUICE/GLUCOSE TABS, WHEN BG &lt; 70   HYDRALAZINE  (APRESOLINE ) 50 MG TABLET    Take 1 tablet by mouth 3 (three) times daily.   INSULIN  GLARGINE, 2 UNIT DIAL , (TOUJEO  MAX SOLOSTAR) 300 UNIT/ML SOLOSTAR PEN    Inject 50 Units into the skin at bedtime. Plus sliding scale, max dose 80 units a day   INSULIN  LISPRO (HUMALOG  KWIKPEN) 100 UNIT/ML KWIKPEN    INJECT 15 UNITS INTO THE SKIN 3 TIMES DAILY PLUS SLIDING SCALE, MAX DOSE 80 UNITS DAILY.   LEVETIRACETAM  (KEPPRA ) 500 MG TABLET    Take 500-750 mg by mouth See admin instructions. Take 1 tablet (500mg ) by mouth every morning and take 1 tablets by mouth (750mg ) by mouth every night   MECLIZINE  (ANTIVERT ) 25 MG TABLET    Take 1 tablet (25 mg total) by mouth 3 (three) times daily as needed for dizziness.   MIRABEGRON  ER (MYRBETRIQ ) 50 MG TB24  TABLET    Take 1 tablet (50 mg total) by mouth daily.   NITROGLYCERIN  (NITROSTAT ) 0.4 MG SL TABLET    Place 0.4 mg under the tongue every 5 (five) minutes as needed for chest pain.   OLMESARTAN -HYDROCHLOROTHIAZIDE  (BENICAR  HCT) 40-12.5 MG TABLET    Take 1 tablet by mouth daily.   OXYCODONE -ACETAMINOPHEN  (PERCOCET) 5-325 MG TABLET    Take 1 tablet by mouth every 8 (eight) hours as needed for severe pain (pain score 7-10). Must last 7 days.   PANTOPRAZOLE  (PROTONIX ) 40 MG TABLET    TAKE 1 TABLET BY MOUTH DAILY AS  NEEDED   POTASSIUM CHLORIDE  SA (KLOR-CON  M) 20 MEQ TABLET    Take 40 mEq by mouth.   ROSUVASTATIN  (CRESTOR ) 40 MG TABLET    TAKE 1 TABLET BY MOUTH DAILY   TIZANIDINE  (ZANAFLEX ) 4 MG TABLET    Take 1 tablet (4 mg total) by mouth every 8 (eight) hours as needed for muscle spasms.   UBROGEPANT  (UBRELVY ) 50 MG TABS    50 mg or 100 mg taken orally with or without food. If needed, a second dose may be taken at least 2 hours after the initial dose. The maximum dose in a 24-hour  period is 200 mg   WIXELA INHUB 250-50 MCG/DOSE AEPB    Inhale 1 puff into the lungs 2 (two) times daily.  Modified Medications   No medications on file  Discontinued Medications   No medications on file    Allergies Allergies  Allergen Reactions   Codeine Swelling and Other (See Comments)    Swelling and burning of mouth (inside)   Farxiga  [Dapagliflozin ] Other (See Comments)    Recurrent UTI with farxiga    Lactose Intolerance (Gi) Nausea And Vomiting    Past Medical History Past Medical History:  Diagnosis Date   Anxiety and depression 09/13/2006   Qualifier: Diagnosis of  By: Gladis [name], Nykedtra     Arthritis    joint pain    Asthma    CHF (congestive heart failure) (HCC)    COPD (chronic obstructive pulmonary disease) (HCC)    chronic bronchitis    Depression    Diabetes mellitus    since age 74; type 2 IDDM   Diabetic neuropathy, painful (HCC)    FEET AND HANDS   Diabetic retinopathy (HCC)     Diastolic dysfunction    a.  Echo 11/17: EF 60-65%, mild LVH, no RWMA, Gr1DD, mild AI, dilated aortic root measuring 38 mm, mildly dilated ascending aorta   Dilated aortic root    38mm by echo 11/2015   Dizziness    secondary to diabetes and hypertension    Epilepsy idiopathic petit mal (HCC) 09/13/2006   last seizure 2012;controlled w/ topomax   GERD (gastroesophageal reflux disease)    Glaucoma    NOT ON ANY EYE DROPS    Headache(784.0)    migraines    Heart murmur    born with    History of stress test    a. 11/17: Normal perfusion, EF 53%, normal study   Hx of blood clots    hematomas removed from left side of brain from 11mos to 58yrs old    Hyperlipidemia    Hypertension    Hypertensive urgency 04/23/2021   Legally blind    left eye    MIGRAINE HEADACHE 09/14/2006   Qualifier: Diagnosis of  By: Gladis [name], Nykedtra     Myocardial infarction [facility])    a.  Patient reported history of without objective documentation   Nephrolithiasis    frequent urination , urination at nite  PT SEEN IN ER 09/22/11 FOR BACK AND RT SIDED PAIN--HAS KNOWN STONE RT URETER AND UA IN ER SHOWED UTI   Pain 09/23/2011   LOWER BACK AND RIGHT SIDE--PT HAS RIGHT URETERAL STONE   Pneumonia    hx of 2009   Pyelonephritis    SBO (small bowel obstruction) (HCC) 04/23/2021   Seizures (HCC)    Sleep apnea    [facility];does not use Cpap ; mild   Small bowel obstruction (HCC) 05/18/2021   Stress incontinence    Stroke (HCC)    last 2003  RESIDUAL LEFT LEG WEAKNESS--NO OTHER RESIDUAL PROBLEMS   Syncope 07/07/2021    Past Surgical History Past Surgical History:  Procedure Laterality Date   BRAIN HEMATOMA EVACUATION     five procedures total, first procedure when 43 months old, last at 58 years of age.   CYSTOSCOPY W/ RETROGRADES  09/24/2011   Procedure: CYSTOSCOPY WITH RETROGRADE PYELOGRAM;  Surgeon: Norleen JINNY Seltzer, MD;  Location: [facility];  Service: Urology;  Laterality: Right;    CYSTOSCOPY WITH URETEROSCOPY  09/24/2011   Procedure: CYSTOSCOPY WITH URETEROSCOPY;  Surgeon:  Norleen JINNY Seltzer, MD;  Location: [facility];  Service: Urology;  Laterality: Right;  Balloon dilation right ureter    CYSTOSCOPY/RETROGRADE/URETEROSCOPY  08/24/2011   Procedure: CYSTOSCOPY/RETROGRADE/URETEROSCOPY;  Surgeon: Toribio Neysa Repine, MD;  Location: [facility];  Service: Urology;  Laterality: Right;  Cysto, Right retrograde Pyelogram, right stent placement.    INCISIONAL HERNIA REPAIR  05/05/2011   Procedure: LAPAROSCOPIC INCISIONAL HERNIA;  Surgeon: Donnice Bury, MD;  Location: [facility] OR;  Service: General;  Laterality: N/A;   kidney stone removal     LAPAROTOMY N/A 06/12/2021   Procedure: EXPLORATORY LAPAROTOMY, LYSIS OF ADHESIONS;  Surgeon: Desiderio Schanz, MD;  Location: [facility];  Service: General;  Laterality: N/A;   MASS EXCISION  05/05/2011   Procedure: EXCISION MASS;  Surgeon: Donnice Bury, MD;  Location: [facility] OR;  Service: General;  Laterality: Right;   PCNL     RETINAL DETACHMENT SURGERY Left 1990   RIGHT/LEFT HEART CATH AND CORONARY ANGIOGRAPHY N/A 04/28/2019   Procedure: RIGHT/LEFT HEART CATH AND CORONARY ANGIOGRAPHY;  Surgeon: Florencio Cara BIRCH, MD;  Location: [facility];  Service: Cardiovascular;  Laterality: N/A;   SHUNT REMOVAL     shunt inserted at age 12 removed at age 73    VAGINAL HYSTERECTOMY  68    Family History family history includes ADD / ADHD in her maternal grandfather; Birth defects in her daughter and maternal grandfather; Brain cancer in her maternal grandmother; Cancer in her mother; Cirrhosis in her maternal grandfather; Diabetes in her maternal grandfather; Hypertension in her daughter, maternal grandmother, and mother; Uterine cancer in her mother.  Social History Social History   Socioeconomic History   Marital status: Widowed    Spouse name: Not on file   Number of children: 2   Years of education: Not on file   Highest education level: 12th grade   Occupational History   Occupation: disabled  Tobacco Use   Smoking status: Former    Current packs/day: 0.00    Average packs/day: 1 pack/day for 15.0 years (15.0 ttl pk-yrs)    Types: Cigarettes    Start date: 01/20/1972    Quit date: 01/20/1987    Years since quitting: 36.9   Smokeless tobacco: Never   Tobacco comments:    quit more than 20 years  Vaping Use   Vaping status: Never Used  Substance and Sexual Activity   Alcohol  use: Not Currently    Alcohol /week: 1.0 standard drink of alcohol     Types: 1 Glasses of wine per week    Comment: occ   Drug use: No    Comment: hx of marijuana use no longer uses    Sexual activity: Yes    Partners: Male    Birth control/protection: Surgical  Other Topics Concern   Not on file  Social History Narrative   Pt lives with her daughter   Social Drivers of Health   Financial Resource Strain: Low Risk  (04/01/2023)   Overall Financial Resource Strain (CARDIA)    Difficulty of Paying Living Expenses: Not hard at all  Food Insecurity: No Food Insecurity (09/16/2023)   Hunger Vital Sign    Worried About Running Out of Food in the Last Year: Never true    Ran Out of Food in the Last Year: Never true  Transportation Needs: No Transportation Needs (09/16/2023)   PRAPARE - Administrator, Civil Service (Medical): No    Lack of Transportation (Non-Medical): No  Physical Activity: Insufficiently Active (04/01/2023)   Exercise Vital  Sign    Days of Exercise per Week: 2 days    Minutes of Exercise per Session: 10 min  Stress: No Stress Concern Present (04/01/2023)   [employer] of Occupational Health - Occupational Stress Questionnaire    Feeling of Stress : Not at all  Social Connections: Moderately Isolated (04/01/2023)   Social Connection and Isolation Panel    Frequency of Communication with Friends and Family: More than three times a week    Frequency of Social Gatherings with Friends and Family: Once a week    Attends  Religious Services: More than 4 times per year    Active Member of [employer] or Organizations: No    Attends Banker Meetings: Never    Marital Status: Widowed  Intimate Partner Violence: Not At Risk (09/16/2023)   Humiliation, Afraid, Rape, and Kick questionnaire    Fear of Current or Ex-Partner: No    Emotionally Abused: No    Physically Abused: No    Sexually Abused: No    Lab Results  Component Value Date   HGBA1C 8.6 (A) 12/13/2023   HGBA1C 9.9 (H) 08/27/2023   HGBA1C 11.0 (A) 02/04/2023   Lab Results  Component Value Date   CHOL 129 02/16/2023   Lab Results  Component Value Date   HDL 45 (L) 02/16/2023   Lab Results  Component Value Date   LDLCALC 60 02/16/2023   Lab Results  Component Value Date   TRIG 166 (H) 02/16/2023   Lab Results  Component Value Date   CHOLHDL 2.9 02/16/2023   Lab Results  Component Value Date   CREATININE 0.83 09/17/2023   Lab Results  Component Value Date   GFR 74.92 04/14/2022   Lab Results  Component Value Date   MICROALBUR 0.9 11/17/2021      Component Value Date/Time   NA 140 09/17/2023 0447   NA 138 05/08/2015 1106   K 3.8 09/17/2023 0447   CL 107 09/17/2023 0447   CO2 26 09/17/2023 0447   GLUCOSE 182 (H) 09/17/2023 0447   BUN 16 09/17/2023 0447   BUN 14 05/08/2015 1106   CREATININE 0.83 09/17/2023 0447   CREATININE 0.90 08/27/2023 1451   CALCIUM  8.7 (L) 09/17/2023 0447   PROT 6.9 09/17/2023 0447   PROT 7.7 05/08/2015 1106   ALBUMIN  3.1 (L) 09/17/2023 0447   ALBUMIN  4.1 05/08/2015 1106   AST 18 09/17/2023 0447   ALT 17 09/17/2023 0447   ALKPHOS 79 09/17/2023 0447   BILITOT 0.3 09/17/2023 0447   BILITOT 0.3 05/08/2015 1106   GFRNONAA >60 09/17/2023 0447   GFRNONAA 81 11/28/2018 0000   GFRAA >60 05/30/2019 1543   GFRAA 94 11/28/2018 0000      Latest Ref Rng & Units 09/17/2023    4:47 AM 09/16/2023    9:50 PM 09/16/2023    1:31 PM  BMP  Glucose 70 - 99 mg/dL 817   756   BUN 6 - 20 mg/dL 16   19    Creatinine 9.55 - 1.00 mg/dL 9.16  9.11  9.00   Sodium 135 - 145 mmol/L 140   141   Potassium 3.5 - 5.1 mmol/L 3.8   3.9   Chloride 98 - 111 mmol/L 107   108   CO2 22 - 32 mmol/L 26   26   Calcium  8.9 - 10.3 mg/dL 8.7   8.9        Component Value Date/Time   WBC 6.1 09/17/2023 0447   RBC 3.91  09/17/2023 0447   HGB 11.0 (L) 09/17/2023 0447   HGB 13.1 05/08/2015 1106   HCT 35.9 (L) 09/17/2023 0447   HCT 39.5 05/08/2015 1106   PLT 269 09/17/2023 0447   PLT 274 05/08/2015 1106   MCV 91.8 09/17/2023 0447   MCV 91 05/08/2015 1106   MCH 28.1 09/17/2023 0447   MCHC 30.6 09/17/2023 0447   RDW 14.4 09/17/2023 0447   RDW 14.1 05/08/2015 1106   LYMPHSABS 1.9 09/16/2023 1331   LYMPHSABS 1.7 05/08/2015 1106   MONOABS 0.6 09/16/2023 1331   EOSABS 0.0 09/16/2023 1331   EOSABS 0.0 05/08/2015 1106   BASOSABS 0.0 09/16/2023 1331   BASOSABS 0.0 05/08/2015 1106     Parts of this note may have been dictated using voice recognition software. There may be variances in spelling and vocabulary which are unintentional. Not all errors are proofread. Please notify the dino if any discrepancies are noted or if the meaning of any statement is not clear.

## 2023-12-13 NOTE — Patient Instructions (Addendum)
 Will recommend the following: Trulicity  4.5 mg/week Toujeo  75 units daily in a.m. Humalog : 17 units before break fast, 13 units before lunch and 15 units before supper: 15 min before meals Humalog  correction scale: Use in addition to your meal time insulin  based on blood sugars as follows:  151 - 175: 1 unit 176 - 200: 2 units 201 - 225: 3 units 226 - 250: 4 units 251 - 275: 5 units 276 - 300: 6 units 301 - 325: 7 units 326 - 350: 8 units 351 - 375: 9 units 376 - 400: 10 units

## 2023-12-13 NOTE — Progress Notes (Signed)
 [name]                                          MRN: [id number]   12/13/2023   The [facility] Quality Team Specialist reviewed this patient medical record for the purposes of chart review for care gap closure. The following were reviewed: chart review for care gap closure-glycemic status assessment.    [facility] Quality Team

## 2023-12-14 ENCOUNTER — Encounter: Payer: Self-pay | Admitting: "Endocrinology

## 2023-12-21 ENCOUNTER — Other Ambulatory Visit: Payer: Self-pay | Admitting: Family Medicine

## 2023-12-21 DIAGNOSIS — I10 Essential (primary) hypertension: Secondary | ICD-10-CM

## 2023-12-23 NOTE — Telephone Encounter (Signed)
 Requested Prescriptions  Refused Prescriptions Disp Refills   olmesartan -hydrochlorothiazide  (BENICAR  HCT) 40-12.5 MG tablet [Pharmacy Med Name: Olmesartan  Medoxomil-HCTZ 40-12.5 MG Oral Tablet] 90 tablet 3    Sig: TAKE 1 TABLET BY MOUTH DAILY     Cardiovascular: ARB + Diuretic Combos Passed - 12/23/2023  4:18 PM      Passed - K in normal range and within 180 days    Potassium  Date Value Ref Range Status  09/17/2023 3.8 3.5 - 5.1 mmol/L Final         Passed - Na in normal range and within 180 days    Sodium  Date Value Ref Range Status  09/17/2023 140 135 - 145 mmol/L Final  05/08/2015 138 134 - 144 mmol/L Final         Passed - Cr in normal range and within 180 days    Creat  Date Value Ref Range Status  08/27/2023 0.90 0.50 - 1.03 mg/dL Final   Creatinine, Ser  Date Value Ref Range Status  09/17/2023 0.83 0.44 - 1.00 mg/dL Final   Creatinine, Urine  Date Value Ref Range Status  11/17/2021 25 20 - 275 mg/dL Final         Passed - eGFR is 10 or above and within 180 days    GFR, Est African American  Date Value Ref Range Status  11/28/2018 94 > OR = 60 mL/min/1.55m2 Final   GFR calc Af Amer  Date Value Ref Range Status  05/30/2019 >60 >60 mL/min Final   GFR, Est Non African American  Date Value Ref Range Status  11/28/2018 81 > OR = 60 mL/min/1.83m2 Final   GFR, Estimated  Date Value Ref Range Status  09/17/2023 >60 >60 mL/min Final    Comment:    (NOTE) Calculated using the CKD-EPI Creatinine Equation (2021)    GFR  Date Value Ref Range Status  04/14/2022 74.92 >60.00 mL/min Final    Comment:    Calculated using the CKD-EPI Creatinine Equation (2021)   eGFR  Date Value Ref Range Status  08/27/2023 74 > OR = 60 mL/min/1.45m2 Final         Passed - Patient is not pregnant      Passed - Last BP in normal range    BP Readings from Last 1 Encounters:  12/13/23 118/80         Passed - Valid encounter within last 6 months    Recent Outpatient Visits            2 months ago Slurred speech   [facility] Gareth Mliss FALCON, [name]   3 months ago Encounter for support and coordination of transition of care   [facility] [name], [name]   6 months ago Uncontrolled hypertension   [facility] Glenard Mire, MD   9 months ago AKI (acute kidney injury)   [facility] [name], [name]   11 years ago HYPERTENSION   [facility] Rai, Nydia POUR, MD

## 2024-01-05 NOTE — Progress Notes (Signed)
 [name]                                          MRN: [id number]   01/05/2024   The [facility] Quality Team Specialist reviewed this patient medical record for the purposes of chart review for care gap closure. The following were reviewed: abstraction for care gap closure-glycemic status assessment.    [facility] Quality Team

## 2024-01-05 NOTE — Progress Notes (Signed)
 [name]                                          MRN: [id number]   01/05/2024   The [facility] Quality Team Specialist reviewed this patient medical record for the purposes of chart review for care gap closure. The following were reviewed: chart review for care gap closure-kidney health evaluation for diabetes:eGFR  and uACR.    [facility] Quality Team

## 2024-01-06 ENCOUNTER — Encounter: Payer: Self-pay | Admitting: "Endocrinology

## 2024-01-06 DIAGNOSIS — E1165 Type 2 diabetes mellitus with hyperglycemia: Secondary | ICD-10-CM

## 2024-01-07 MED ORDER — EMBECTA PEN NEEDLE NANO 2 GEN 32G X 4 MM MISC: 1.0000 | Freq: Three times a day (TID) | 0 refills | Status: AC

## 2024-01-19 ENCOUNTER — Other Ambulatory Visit

## 2024-01-19 LAB — COMPREHENSIVE METABOLIC PANEL WITH GFR
AG Ratio: 1.4 (calc) (ref 1.0–2.5)
ALT: 19 U/L (ref 6–29)
AST: 13 U/L (ref 10–35)
Albumin: 4 g/dL (ref 3.6–5.1)
Alkaline phosphatase (APISO): 84 U/L (ref 37–153)
BUN: 14 mg/dL (ref 7–25)
CO2: 29 mmol/L (ref 20–32)
Calcium: 9 mg/dL (ref 8.6–10.4)
Chloride: 105 mmol/L (ref 98–110)
Creat: 0.82 mg/dL (ref 0.50–1.03)
Globulin: 2.8 g/dL (ref 1.9–3.7)
Glucose, Bld: 145 mg/dL — ABNORMAL HIGH (ref 65–99)
Potassium: 4.1 mmol/L (ref 3.5–5.3)
Sodium: 141 mmol/L (ref 135–146)
Total Bilirubin: 0.3 mg/dL (ref 0.2–1.2)
Total Protein: 6.8 g/dL (ref 6.1–8.1)
eGFR: 82 mL/min/1.73m2

## 2024-01-19 LAB — LIPID PANEL
Cholesterol: 148 mg/dL
HDL: 46 mg/dL — ABNORMAL LOW
LDL Cholesterol (Calc): 84 mg/dL
Non-HDL Cholesterol (Calc): 102 mg/dL
Total CHOL/HDL Ratio: 3.2 (calc)
Triglycerides: 86 mg/dL

## 2024-01-21 ENCOUNTER — Other Ambulatory Visit: Payer: Self-pay | Admitting: "Endocrinology

## 2024-01-21 NOTE — Telephone Encounter (Signed)
 Requested Prescriptions   Pending Prescriptions Disp Refills   TRULICITY  4.5 MG/0.5ML SOAJ [Pharmacy Med Name: Trulicity  4.5 MG/0.5ML Subcutaneous Solution Pen-injector] 6 mL 3    Sig: INJECT THE CONTENTS OF ONE PEN  SUBCUTANEOUSLY WEEKLY AS  DIRECTED

## 2024-01-24 ENCOUNTER — Other Ambulatory Visit

## 2024-01-24 ENCOUNTER — Encounter: Payer: Self-pay | Admitting: "Endocrinology

## 2024-01-24 ENCOUNTER — Ambulatory Visit: Admitting: "Endocrinology

## 2024-01-24 VITALS — BP 100/70 | HR 87 | Ht 65.0 in | Wt 255.0 lb

## 2024-01-24 DIAGNOSIS — E1165 Type 2 diabetes mellitus with hyperglycemia: Secondary | ICD-10-CM | POA: Diagnosis not present

## 2024-01-24 DIAGNOSIS — E7800 Pure hypercholesterolemia, unspecified: Secondary | ICD-10-CM

## 2024-01-24 DIAGNOSIS — Z7985 Long-term (current) use of injectable non-insulin antidiabetic drugs: Secondary | ICD-10-CM | POA: Diagnosis not present

## 2024-01-24 DIAGNOSIS — Z794 Long term (current) use of insulin: Secondary | ICD-10-CM

## 2024-01-24 NOTE — Patient Instructions (Signed)
" °  Trulicity  4.5 mg/week  [place]  72 units daily Humalog : 19 units before break fast, 14 units before lunch and 16 units before supper: 15 min before meals Humalog  correction scale: Use in addition to your meal time insulin  based on blood sugars as follows:  151 - 175: 1 unit 176 - 200: 2 units 201 - 225: 3 units 226 - 250: 4 units 251 - 275: 5 units 276 - 300: 6 units 301 - 325: 7 units 326 - 350: 8 units 351 - 375: 9 units 376 - 400: 10 units    __________    "

## 2024-01-24 NOTE — Progress Notes (Signed)
 "   Outpatient Endocrinology Note Karen Birmingham, MD  01/24/2024  [name] [facility] 18-Apr-1965 [id number]  Referring Provider: No ref. provider found Primary Care Provider: [name], [name] (Inactive) Reason for consultation: Subjective   Assessment & Plan  Diagnoses and all orders for this visit:  Uncontrolled type 2 diabetes mellitus with hyperglycemia, with long-term current use of insulin  (HCC)  Long-term (current) use of injectable non-insulin  antidiabetic drugs  Long-term insulin  use (HCC)  Insulin  dose changed (HCC)  Pure hypercholesterolemia    Diabetes Type II complicated by CAD, stroke  Lab Results  Component Value Date   GFR 74.92 04/14/2022   Hba1c goal less than 7, current Hba1c is  Lab Results  Component Value Date   HGBA1C 8.6 (A) 12/13/2023   Will recommend the following:  Trulicity  4.5 mg/week  Toujeo  72 units daily Humalog : 19 units before break fast, 14 units before lunch and 16 units before supper: 15 min before meals Humalog  correction scale: Use in addition to your meal time insulin  based on blood sugars as follows:  151 - 175: 1 unit 176 - 200: 2 units 201 - 225: 3 units 226 - 250: 4 units 251 - 275: 5 units 276 - 300: 6 units 301 - 325: 7 units 326 - 350: 8 units 351 - 375: 9 units 376 - 400: 10 units    Libre 3 is covered, Dexcom not covered  Previously, Toujeo  dose changed from 70 to 20 units recently during hospital visit and pt was stopped on Trulicity  4.5 mg weekly due to diarrhea and bloating   06/15/23: 12/13/23 patient has been to the ER twice in the past 3 days because of her high blood pressure, UTI and high blood sugars.  Instructed patient to go to the ER if her blood pressure continues to stay elevated, which has been checked twice today and is still running 170s/100 and patient reports feeling lightheaded.  Instructed diabetes educator to call the patient tomorrow to follow-up on her and as needed thereafter.  Patient  to see me back in a week.  No known contraindications/side effects to any of above medications No history of MEN syndrome/medullary thyroid cancer/pancreatitis or pancreatic cancer in self or family Has Glucagon  -discussed and confirmed with patient  -Last LD and Tg are as follows: Lab Results  Component Value Date   LDLCALC 84 01/19/2024    Lab Results  Component Value Date   TRIG 86 01/19/2024   -On rosuvastatin  40 mg every day, zetia  10 mg every day  -Follow low fat diet and exercise   -Blood pressure goal <140/90 - Microalbumin/creatinine goal is < 30 -Last MA/Cr is as follows: Lab Results  Component Value Date   MICROALBUR 0.9 11/17/2021   -not on ACE/ARB, uncontrolled blood pressure right now.  Instructed patient to take hydralazine  up to 100 mg 3 times a day as needed, and Coreg  up to 50 mg as needed to keep blood pressure less than 140/90.  If blood pressure continues to stay elevated to go and follow-up with her PCP/ER -diet changes including salt restriction -limit eating outside -counseled BP targets per standards of diabetes care -uncontrolled blood pressure can lead to retinopathy, nephropathy and cardiovascular and atherosclerotic heart disease  Reviewed and counseled on: -A1C target -Blood sugar targets -Complications of uncontrolled diabetes  -Checking blood sugar before meals and bedtime and bring log next visit -All medications with mechanism of action and side effects -Hypoglycemia management: rule of 15's, Glucagon  Emergency Kit and medical  alert ID -low-carb low-fat plate-method diet -At least 20 minutes of physical activity per day -Annual dilated retinal eye exam and foot exam -compliance and follow up needs -follow up as scheduled or earlier if problem gets worse  Call if blood sugar is less than 70 or consistently above 250    Take a 15 gm snack of carbohydrate at bedtime before you go to sleep if your blood sugar is less than 100.    If you  are going to fast after midnight for a test or procedure, ask your physician for instructions on how to reduce/decrease your insulin  dose.    Call if blood sugar is less than 70 or consistently above 250  -Treating a low sugar by rule of 15  (15 gms of sugar every 15 min until sugar is more than 70) If you feel your sugar is low, test your sugar to be sure If your sugar is low (less than 70), then take 15 grams of a fast acting Carbohydrate (3-4 glucose tablets or glucose gel or 4 ounces of juice or regular soda) Recheck your sugar 15 min after treating low to make sure it is more than 70 If sugar is still less than 70, treat again with 15 grams of carbohydrate          Don't drive the hour of hypoglycemia  If unconscious/unable to eat or drink by mouth, use glucagon  injection or nasal spray baqsimi and call 911. Can repeat again in 15 min if still unconscious.  Return in about 2 months (around 03/23/2024).   I have reviewed current medications, nurse's notes, allergies, vital signs, past medical and surgical history, family medical history, and social history for this encounter. Counseled patient on symptoms, examination findings, lab findings, imaging results, treatment decisions and monitoring and prognosis. The patient understood the recommendations and agrees with the treatment plan. All questions regarding treatment plan were fully answered.  Karen Birmingham, MD  01/24/2024   History of Present Illness [name] is a 59 y.o. year old female who presents for follow up of Type II diabetes mellitus.  [name] was first diagnosed in 1989.   Diabetes education +  Home diabetes regimen: Trulicity  4.5 mg/week  Toujeo  75 units daily Humalog : 17 units before break fast, 13 units before lunch and 15 units before supper: 15 min before meals Humalog  correction scale: Use in addition to your meal time insulin  based on blood sugars as follows:  151 - 175: 1 unit 176 -  200: 2 units 201 - 225: 3 units 226 - 250: 4 units 251 - 275: 5 units 276 - 300: 6 units 301 - 325: 7 units 326 - 350: 8 units 351 - 375: 9 units 376 - 400: 10 units    Background history:   She thinks she was initially treated with diet alone and subsequently given metformin With metformin she had nausea and diarrhea but she does not know if she was treated with any other medications until she was started on insulin  in 2003.  Also was on glipizide  in 2015 Most likely she was on Levemir  or Lantus  insulin  for quite some time She was then switched to Toujeo  in 2016 and although she was initially prescribed 90 units twice a day this was subsequently lowered  Her A1c has been occasionally between 7.2 and 7.5 but has been consistently over 9% since 2018  PREVIOUS OMNIPOD PUMP settings with Humalog  U-200:  Basal settings: 12 AM = 2.6, 3  AM = 2.4, 9 AM = 2.7, 3 PM = 3.0 Sensitivity 1: 60 and target 140  Max bolus 6 units Active insulin  time 6 hours  COMPLICATIONS +  MI/Stroke -  retinopathy -  neuropathy -  nephropathy  BLOOD SUGAR DATA CGM interpretation: At today's visit, we reviewed her CGM downloads. The full report is scanned in the media. Reviewing the CGM trends, BG are variable, 53% high, 47% at goal and 0% low.  Lows are random, happening mainly overnight or in the afternoon, the highs happening mainly in the morning and evening.  Physical Exam  BP 100/70   Pulse 87   Ht 5' 5 (1.651 m)   Wt 255 lb (115.7 kg)   LMP  (LMP Unknown)   SpO2 99%   BMI 42.43 kg/m    Constitutional: well developed, well nourished Head: normocephalic, atraumatic Eyes: sclera anicteric, no redness Neck: supple Lungs: normal respiratory effort Neurology: alert and oriented Skin: dry, no appreciable rashes Musculoskeletal: no appreciable defects Psychiatric: normal mood and affect Diabetic Foot Exam - Simple   No data filed      Current Medications Patient's Medications  New  Prescriptions   No medications on file  Previous Medications   ACCU-CHEK GUIDE TEST TEST STRIP    USE 1 STRIP IN THE MORNING, AT  NOON, AND AT BEDTIME   ALBUTEROL  (ACCUNEB ) 1.25 MG/3ML NEBULIZER SOLUTION    USE 1 VIAL VIA NEBULIZER TWICE  DAILY   ALBUTEROL  (VENTOLIN  HFA) 108 (90 BASE) MCG/ACT INHALER    USE 1 TO 2 INHALATIONS BY MOUTH  EVERY 6 HOURS AS NEEDED   AMITRIPTYLINE  (ELAVIL ) 25 MG TABLET    Take 2-3 tablets (50-75 mg total) by mouth at bedtime.   AMLODIPINE  (NORVASC ) 10 MG TABLET    Take 1 tablet (10 mg total) by mouth daily.   BUTALBITAL -ACETAMINOPHEN -CAFFEINE  (FIORICET ) 50-325-40 MG TABLET    Take 1-2 tablets by mouth every 6 (six) hours as needed for headache.   CARVEDILOL  (COREG ) 25 MG TABLET    Take 25 mg by mouth 2 (two) times daily.   CHOLECALCIFEROL  (VITAMIN D3) 25 MCG (1000 UNIT) TABLET    Take 1 tablet (1,000 Units total) by mouth daily.   CLONIDINE  (CATAPRES ) 0.1 MG TABLET    Take 0.1 mg by mouth daily as needed.   CLOPIDOGREL  (PLAVIX ) 75 MG TABLET    Take 1 tablet (75 mg total) by mouth daily.   CONTINUOUS GLUCOSE SENSOR (FREESTYLE LIBRE 3 PLUS SENSOR) MISC    Inject 1 Device into the skin continuous. Change every 14 days   CONTINUOUS GLUCOSE SENSOR (FREESTYLE LIBRE 3 PLUS SENSOR) MISC    Change sensor every 15 days.   CONTINUOUS GLUCOSE SENSOR (FREESTYLE LIBRE 3 SENSOR) MISC    1 Device by Does not apply route continuous.   CYCLOSPORINE  (RESTASIS ) 0.05 % OPHTHALMIC EMULSION    Place 2 drops into both eyes 2 (two) times daily.   DULOXETINE  (CYMBALTA ) 60 MG CAPSULE    Take 1 capsule (60 mg total) by mouth daily.   EPINEPHRINE  0.3 MG/0.3 ML IJ SOAJ INJECTION    INJECT 1 PEN INTRAMUSCULARLY AS  NEEDED FOR ALLERGIC RESPONSE AS  DIRECTED BY MD. [facility]EN MEDICAL  HELP AFTER USE   EZETIMIBE  (ZETIA ) 10 MG TABLET    Take 1 tablet (10 mg total) by mouth daily.   FLUTICASONE  (FLONASE ) 50 MCG/ACT NASAL SPRAY    Place into both nostrils.   FUROSEMIDE  (LASIX ) 20 MG TABLET    Take 1  tablet (20  mg total) by mouth daily.   GABAPENTIN  (NEURONTIN ) 600 MG TABLET    600 mg qAM, 600 mg qPM, 1200 mg QHS   GVOKE HYPOPEN  2-PACK 1 MG/0.2ML SOAJ    INJECT SUBCUTANEOUSLY 1MG  ONCE  AS NEEDED FOR SEVERE LOW BLOOD  SUGAR NOT RESPONDING TO DRINKING JUICE/GLUCOSE TABS, WHEN BG &lt; 70   HYDRALAZINE  (APRESOLINE ) 50 MG TABLET    Take 1 tablet by mouth 3 (three) times daily.   INSULIN  GLARGINE, 2 UNIT DIAL , (TOUJEO  MAX SOLOSTAR) 300 UNIT/ML SOLOSTAR PEN    Inject 50 Units into the skin at bedtime. Plus sliding scale, max dose 80 units a day   INSULIN  LISPRO (HUMALOG  KWIKPEN) 100 UNIT/ML KWIKPEN    INJECT 15 UNITS INTO THE SKIN 3 TIMES DAILY PLUS SLIDING SCALE, MAX DOSE 80 UNITS DAILY.   INSULIN  PEN NEEDLE (EMBECTA PEN NEEDLE NANO 2 GEN) 32G X 4 MM MISC    1 Needle by Other route 3 (three) times daily.   LEVETIRACETAM  (KEPPRA ) 500 MG TABLET    Take 500-750 mg by mouth See admin instructions. Take 1 tablet (500mg ) by mouth every morning and take 1 tablets by mouth (750mg ) by mouth every night   MECLIZINE  (ANTIVERT ) 25 MG TABLET    Take 1 tablet (25 mg total) by mouth 3 (three) times daily as needed for dizziness.   MIRABEGRON  ER (MYRBETRIQ ) 50 MG TB24 TABLET    Take 1 tablet (50 mg total) by mouth daily.   NITROGLYCERIN  (NITROSTAT ) 0.4 MG SL TABLET    Place 0.4 mg under the tongue every 5 (five) minutes as needed for chest pain.   OLMESARTAN -HYDROCHLOROTHIAZIDE  (BENICAR  HCT) 40-12.5 MG TABLET    Take 1 tablet by mouth daily.   PANTOPRAZOLE  (PROTONIX ) 40 MG TABLET    TAKE 1 TABLET BY MOUTH DAILY AS  NEEDED   POTASSIUM CHLORIDE  SA (KLOR-CON  M) 20 MEQ TABLET    Take 40 mEq by mouth.   ROSUVASTATIN  (CRESTOR ) 40 MG TABLET    TAKE 1 TABLET BY MOUTH DAILY   TIZANIDINE  (ZANAFLEX ) 4 MG TABLET    Take 1 tablet (4 mg total) by mouth every 8 (eight) hours as needed for muscle spasms.   TRULICITY  4.5 MG/0.5ML SOAJ    INJECT THE CONTENTS OF ONE PEN  SUBCUTANEOUSLY WEEKLY AS  DIRECTED   UBROGEPANT  (UBRELVY ) 50 MG TABS    50  mg or 100 mg taken orally with or without food. If needed, a second dose may be taken at least 2 hours after the initial dose. The maximum dose in a 24-hour period is 200 mg   WIXELA INHUB 250-50 MCG/DOSE AEPB    Inhale 1 puff into the lungs 2 (two) times daily.  Modified Medications   No medications on file  Discontinued Medications   No medications on file    Allergies Allergies  Allergen Reactions   Codeine Swelling and Other (See Comments)    Swelling and burning of mouth (inside)   Farxiga  [Dapagliflozin ] Other (See Comments)    Recurrent UTI with farxiga    Lactose Intolerance (Gi) Nausea And Vomiting    Past Medical History Past Medical History:  Diagnosis Date   Anxiety and depression 09/13/2006   Qualifier: Diagnosis of  By: Gladis [name], Nykedtra     Arthritis    joint pain    Asthma    CHF (congestive heart failure) (HCC)    COPD (chronic obstructive pulmonary disease) (HCC)    chronic bronchitis    Depression  Diabetes mellitus    since age 49; type 2 IDDM   Diabetic neuropathy, painful (HCC)    FEET AND HANDS   Diabetic retinopathy (HCC)    Diastolic dysfunction    a.  Echo 11/17: EF 60-65%, mild LVH, no RWMA, Gr1DD, mild AI, dilated aortic root measuring 38 mm, mildly dilated ascending aorta   Dilated aortic root    38mm by echo 11/2015   Dizziness    secondary to diabetes and hypertension    Epilepsy idiopathic petit mal (HCC) 09/13/2006   last seizure 2012;controlled w/ topomax   GERD (gastroesophageal reflux disease)    Glaucoma    NOT ON ANY EYE DROPS    Headache(784.0)    migraines    Heart murmur    born with    History of stress test    a. 11/17: Normal perfusion, EF 53%, normal study   Hx of blood clots    hematomas removed from left side of brain from 11mos to 59yrs old    Hyperlipidemia    Hypertension    Hypertensive urgency 04/23/2021   Legally blind    left eye    MIGRAINE HEADACHE 09/14/2006   Qualifier: Diagnosis of  By: Gladis  [name], Nykedtra     Myocardial infarction [facility])    a.  Patient reported history of without objective documentation   Nephrolithiasis    frequent urination , urination at nite  PT SEEN IN ER 09/22/11 FOR BACK AND RT SIDED PAIN--HAS KNOWN STONE RT URETER AND UA IN ER SHOWED UTI   Pain 09/23/2011   LOWER BACK AND RIGHT SIDE--PT HAS RIGHT URETERAL STONE   Pneumonia    hx of 2009   Pyelonephritis    SBO (small bowel obstruction) (HCC) 04/23/2021   Seizures (HCC)    Sleep apnea    [facility];does not use Cpap ; mild   Small bowel obstruction (HCC) 05/18/2021   Stress incontinence    Stroke (HCC)    last 2003  RESIDUAL LEFT LEG WEAKNESS--NO OTHER RESIDUAL PROBLEMS   Syncope 07/07/2021    Past Surgical History Past Surgical History:  Procedure Laterality Date   BRAIN HEMATOMA EVACUATION     five procedures total, first procedure when 86 months old, last at 59 years of age.   CYSTOSCOPY W/ RETROGRADES  09/24/2011   Procedure: CYSTOSCOPY WITH RETROGRADE PYELOGRAM;  Surgeon: Norleen JINNY Seltzer, MD;  Location: [facility];  Service: Urology;  Laterality: Right;   CYSTOSCOPY WITH URETEROSCOPY  09/24/2011   Procedure: CYSTOSCOPY WITH URETEROSCOPY;  Surgeon: Norleen JINNY Seltzer, MD;  Location: [facility];  Service: Urology;  Laterality: Right;  Balloon dilation right ureter    CYSTOSCOPY/RETROGRADE/URETEROSCOPY  08/24/2011   Procedure: CYSTOSCOPY/RETROGRADE/URETEROSCOPY;  Surgeon: Toribio Neysa Repine, MD;  Location: [facility];  Service: Urology;  Laterality: Right;  Cysto, Right retrograde Pyelogram, right stent placement.    INCISIONAL HERNIA REPAIR  05/05/2011   Procedure: LAPAROSCOPIC INCISIONAL HERNIA;  Surgeon: Donnice Bury, MD;  Location: [facility] OR;  Service: General;  Laterality: N/A;   kidney stone removal     LAPAROTOMY N/A 06/12/2021   Procedure: EXPLORATORY LAPAROTOMY, LYSIS OF ADHESIONS;  Surgeon: Desiderio Schanz, MD;  Location: [facility] ORS;  Service: General;  Laterality: N/A;   MASS EXCISION   05/05/2011   Procedure: EXCISION MASS;  Surgeon: Donnice Bury, MD;  Location: [facility] OR;  Service: General;  Laterality: Right;   [facility]     RETINAL DETACHMENT SURGERY Left 1990   RIGHT/LEFT HEART CATH  AND CORONARY ANGIOGRAPHY N/A 04/28/2019   Procedure: RIGHT/LEFT HEART CATH AND CORONARY ANGIOGRAPHY;  Surgeon: Florencio Cara BIRCH, MD;  Location: [facility] INVASIVE CV LAB;  Service: Cardiovascular;  Laterality: N/A;   SHUNT REMOVAL     shunt inserted at age 59 removed at age 55    VAGINAL HYSTERECTOMY  70    Family History family history includes ADD / ADHD in her maternal grandfather; Birth defects in her daughter and maternal grandfather; Brain cancer in her maternal grandmother; Cancer in her mother; Cirrhosis in her maternal grandfather; Diabetes in her maternal grandfather; Hypertension in her daughter, maternal grandmother, and mother; Uterine cancer in her mother.  Social History Social History   Socioeconomic History   Marital status: Widowed    Spouse name: Not on file   Number of children: 2   Years of education: Not on file   Highest education level: 12th grade  Occupational History   Occupation: disabled  Tobacco Use   Smoking status: Former    Current packs/day: 0.00    Average packs/day: 1 pack/day for 15.0 years (15.0 ttl pk-yrs)    Types: Cigarettes    Start date: 01/20/1972    Quit date: 01/20/1987    Years since quitting: 37.0   Smokeless tobacco: Never   Tobacco comments:    quit more than 20 years  Vaping Use   Vaping status: Never Used  Substance and Sexual Activity   Alcohol  use: Not Currently    Alcohol /week: 1.0 standard drink of alcohol     Types: 1 Glasses of wine per week    Comment: occ   Drug use: No    Comment: hx of marijuana use no longer uses    Sexual activity: Yes    Partners: Male    Birth control/protection: Surgical  Other Topics Concern   Not on file  Social History Narrative   Pt lives with her daughter   Social Drivers of Health    Tobacco Use: Medium Risk (01/24/2024)   Patient History    Smoking Tobacco Use: Former    Smokeless Tobacco Use: Never    Passive Exposure: Not on Actuary Strain: Low Risk (04/01/2023)   Overall Financial Resource Strain (CARDIA)    Difficulty of Paying Living Expenses: Not hard at all  Food Insecurity: No Food Insecurity (09/16/2023)   Epic    Worried About Radiation Protection Practitioner of Food in the Last Year: Never true    Ran Out of Food in the Last Year: Never true  Transportation Needs: No Transportation Needs (09/16/2023)   Epic    Lack of Transportation (Medical): No    Lack of Transportation (Non-Medical): No  Physical Activity: Insufficiently Active (04/01/2023)   Exercise Vital Sign    Days of Exercise per Week: 2 days    Minutes of Exercise per Session: 10 min  Stress: No Stress Concern Present (04/01/2023)   [employer] of Occupational Health - Occupational Stress Questionnaire    Feeling of Stress : Not at all  Social Connections: Moderately Isolated (04/01/2023)   Social Connection and Isolation Panel    Frequency of Communication with Friends and Family: More than three times a week    Frequency of Social Gatherings with Friends and Family: Once a week    Attends Religious Services: More than 4 times per year    Active Member of [employer] or Organizations: No    Attends Banker Meetings: Never    Marital Status: Widowed  Intimate Partner Violence:  Not At Risk (09/16/2023)   Epic    Fear of Current or Ex-Partner: No    Emotionally Abused: No    Physically Abused: No    Sexually Abused: No  Depression (PHQ2-9): Low Risk (12/06/2023)   Depression (PHQ2-9)    PHQ-2 Score: 0  Alcohol  Screen: Low Risk (04/01/2023)   Alcohol  Screen    Last Alcohol  Screening Score (AUDIT): 0  Housing: Low Risk (09/16/2023)   Epic    Unable to Pay for Housing in the Last Year: No    Number of Times Moved in the Last Year: 0    Homeless in the Last Year: No  Utilities:  Not At Risk (09/16/2023)   Epic    Threatened with loss of utilities: No  Health Literacy: Adequate Health Literacy (04/01/2023)   B1300 Health Literacy    Frequency of need for help with medical instructions: Never    Lab Results  Component Value Date   HGBA1C 8.6 (A) 12/13/2023   HGBA1C 9.9 (H) 08/27/2023   HGBA1C 11.0 (A) 02/04/2023   Lab Results  Component Value Date   CHOL 148 01/19/2024   Lab Results  Component Value Date   HDL 46 (L) 01/19/2024   Lab Results  Component Value Date   LDLCALC 84 01/19/2024   Lab Results  Component Value Date   TRIG 86 01/19/2024   Lab Results  Component Value Date   CHOLHDL 3.2 01/19/2024   Lab Results  Component Value Date   CREATININE 0.82 01/19/2024   Lab Results  Component Value Date   GFR 74.92 04/14/2022   Lab Results  Component Value Date   MICROALBUR 0.9 11/17/2021      Component Value Date/Time   NA 141 01/19/2024 0754   NA 138 05/08/2015 1106   K 4.1 01/19/2024 0754   CL 105 01/19/2024 0754   CO2 29 01/19/2024 0754   GLUCOSE 145 (H) 01/19/2024 0754   BUN 14 01/19/2024 0754   BUN 14 05/08/2015 1106   CREATININE 0.82 01/19/2024 0754   CALCIUM  9.0 01/19/2024 0754   PROT 6.8 01/19/2024 0754   PROT 7.7 05/08/2015 1106   ALBUMIN  3.1 (L) 09/17/2023 0447   ALBUMIN  4.1 05/08/2015 1106   AST 13 01/19/2024 0754   ALT 19 01/19/2024 0754   ALKPHOS 79 09/17/2023 0447   BILITOT 0.3 01/19/2024 0754   BILITOT 0.3 05/08/2015 1106   GFRNONAA >60 09/17/2023 0447   GFRNONAA 81 11/28/2018 0000   GFRAA >60 05/30/2019 1543   GFRAA 94 11/28/2018 0000      Latest Ref Rng & Units 01/19/2024    7:54 AM 09/17/2023    4:47 AM 09/16/2023    9:50 PM  BMP  Glucose 65 - 99 mg/dL 854  817    BUN 7 - 25 mg/dL 14  16    Creatinine 9.49 - 1.03 mg/dL 9.17  9.16  9.11   BUN/Creat Ratio 6 - 22 (calc) SEE NOTE:     Sodium 135 - 146 mmol/L 141  140    Potassium 3.5 - 5.3 mmol/L 4.1  3.8    Chloride 98 - 110 mmol/L 105  107    CO2  20 - 32 mmol/L 29  26    Calcium  8.6 - 10.4 mg/dL 9.0  8.7         Component Value Date/Time   WBC 6.1 09/17/2023 0447   RBC 3.91 09/17/2023 0447   HGB 11.0 (L) 09/17/2023 0447   HGB 13.1 05/08/2015 1106  HCT 35.9 (L) 09/17/2023 0447   HCT 39.5 05/08/2015 1106   PLT 269 09/17/2023 0447   PLT 274 05/08/2015 1106   MCV 91.8 09/17/2023 0447   MCV 91 05/08/2015 1106   MCH 28.1 09/17/2023 0447   MCHC 30.6 09/17/2023 0447   RDW 14.4 09/17/2023 0447   RDW 14.1 05/08/2015 1106   LYMPHSABS 1.9 09/16/2023 1331   LYMPHSABS 1.7 05/08/2015 1106   MONOABS 0.6 09/16/2023 1331   EOSABS 0.0 09/16/2023 1331   EOSABS 0.0 05/08/2015 1106   BASOSABS 0.0 09/16/2023 1331   BASOSABS 0.0 05/08/2015 1106     Parts of this note may have been dictated using voice recognition software. There may be variances in spelling and vocabulary which are unintentional. Not all errors are proofread. Please notify the dino if any discrepancies are noted or if the meaning of any statement is not clear.   "

## 2024-01-25 LAB — MICROALBUMIN / CREATININE URINE RATIO
Creatinine, Urine: 96 mg/dL (ref 20–275)
Microalb Creat Ratio: 60 mg/g{creat} — ABNORMAL HIGH
Microalb, Ur: 5.8 mg/dL

## 2024-01-28 ENCOUNTER — Encounter: Payer: Self-pay | Admitting: Nurse Practitioner

## 2024-01-30 NOTE — Progress Notes (Unsigned)
 PROVIDER NOTE: Interpretation of information contained herein should be left to medically-trained personnel. Specific patient instructions are provided elsewhere under Patient Instructions section of medical record. This document was created in part using AI and STT-dictation technology, any transcriptional errors that may result from this process are unintentional.  Patient: [name]  Service: E/M   PCP: [name], [name], [name] (Inactive)  DOB: 04-13-1965  DOS: 02/01/2024  Provider: Emmy MARLA Blanch, NP  MRN: [id number]  Delivery: Face-to-face  Specialty: Interventional Pain Management  Type: Established Patient  Setting: Ambulatory outpatient facility  Specialty designation: 09  Referring Prov.: [name], [name]  Location: Outpatient office facility       History of present illness (HPI) [name], a 59 y.o. year old female, is here today because of her No primary diagnosis found.. [name] primary complain today is No chief complaint on file.  Pertinent problems: [name] has Dyslipidemia associated with type 2 diabetes mellitus (HCC); Essential hypertension, benign; Chronic pain syndrome; Hemiparesis affecting left side as late effect of stroke (HCC); Type 2 diabetes mellitus with hyperglycemia (HCC); and Closed displaced fracture of fifth metatarsal bone of left foot on their pertinent problem list.  Pain Assessment: Severity of   is reported as a  /10. Location:    / . Onset:  . Quality:  . Timing:  . Modifying factor(s):  SABRA Vitals:  vitals were not taken for this visit.  BMI: Estimated body mass index is 42.43 kg/m as calculated from the following:   Height as of 01/24/24: 5' 5 (1.651 m).   Weight as of 01/24/24: 255 lb (115.7 kg).  Last encounter: 12/06/2023. Last procedure: Visit date not found.  Reason for encounter: {Blank single:19197::medication management,post-procedure evaluation and assessment,both, medication management and post-procedure  evaluation and assessment,evaluation of worsening, or previously known (established) problem,patient-requested evaluation,follow-up evaluation,evaluation for possible interventional PM therapy/treatment,evaluation of new problem, *** }.   Discussed the use of AI scribe software for clinical note transcription with the patient, who gave verbal consent to proceed.  History of Present Illness           Pharmacotherapy Assessment   Oxycodone -acetaminophen  (Percocet) 5-325 mg tablet every 8 hours as needed for shoulder and low back pain.  Monitoring:  PMP: PDMP reviewed during this encounter.       Pharmacotherapy: No side-effects or adverse reactions reported. Compliance: No problems identified. Effectiveness: Clinically acceptable.  Erlene Doyal Calderon, [place]  12/06/2023  9:21 AM  Sign when Signing Visit Nursing Pain Medication Assessment:  Safety precautions to be maintained throughout the outpatient stay will include: orient to surroundings, keep bed in low position, maintain call bell within reach at all times, provide assistance with transfer out of bed and ambulation.  Medication Inspection Compliance: Pill count conducted under aseptic conditions, in front of the patient. Neither the pills nor the bottle was removed from the patient's sight at any time. Once count was completed pills were immediately returned to the patient in their original bottle.  Medication: Oxycodone /APAP Pill/Patch Count: 9 of 90 pills/patches remain Pill/Patch Appearance: Markings consistent with prescribed medication Bottle Appearance: Standard pharmacy container. Clearly labeled. Filled Date: 09 / 25 / 2025 Last Medication intake:  Today    UDS:  Summary  Date Value Ref Range Status  10/14/2023 FINAL  Final    Comment:    ==================================================================== [employer] (MW) ==================================================================== Test  Result       Flag       Units  Drug Absent but Declared for Prescription Verification   Oxycodone                       Not Detected UNEXPECTED ng/mg creat   Butalbital                      Not Detected UNEXPECTED ==================================================================== Test                      Result    Flag   Units      Ref Range   Creatinine              72               mg/dL      >=79 ==================================================================== Declared Medications:  The flagging and interpretation on this report are based on the  following declared medications.  Unexpected results may arise from  inaccuracies in the declared medications.   **Note: The testing scope of this panel includes these medications:   Butalbital  (Fioricet )  Oxycodone  (Percocet)   **Note: The testing scope of this panel does not include the  following reported medications:   Acetaminophen  (Fioricet )  Acetaminophen  (Percocet)  Albuterol  (AccuNeb )  Amitriptyline  (Elavil )  Amlodipine  (Norvasc )  Caffeine  (Fioricet )  Carvedilol  (Coreg )  Clonidine  (Catapres )  Clopidogrel  (Plavix )  Cyclosporine  (Restasis )  Dulaglutide  (Trulicity )  Duloxetine  (Cymbalta )  Epinephrine  (EpiPen )  Ezetimibe  (Zetia )  Fluticasone  (Wixela Inhub)  Furosemide  (Lasix )  Gabapentin  (Neurontin )  Glucagon  (Gvoke)  Hydralazine  (Apresoline )  Insulin  (Toujeo )  Levetiracetam  (Keppra )  Meclizine  (Antivert )  Mirabegron  (Myrbetriq )  Nitroglycerin  (Nitrostat )  Olmesartan  (Benicar )  Pantoprazole  (Protonix )  Potassium (Klor-Con )  Rosuvastatin  (Crestor )  Salmeterol (Wixela Inhub)  Tizanidine  (Zanaflex )  Ubrogepant  (Ubrelvy )  Vitamin D3 ==================================================================== For clinical consultation, please call [phone]. ====================================================================     No results found for: CBDTHCR No results found for:  D8THCCBX No results found for: D9THCCBX  ROS  Constitutional: Denies any fever or chills Gastrointestinal: No reported hemesis, hematochezia, vomiting, or acute GI distress Musculoskeletal: Low back pain, left leg pain  Neurological: No reported episodes of acute onset apraxia, aphasia, dysarthria, agnosia, amnesia, paralysis, loss of coordination, or loss of consciousness  Medication Review  DULoxetine , Dulaglutide , EPINEPHrine , FreeStyle Libre 3 Plus Sensor, FreeStyle Libre 3 Sensor, Glucagon , Insulin  Pen Needle, Ubrogepant , albuterol , amLODipine , amitriptyline , butalbital -acetaminophen -caffeine , carvedilol , cholecalciferol , cloNIDine , clopidogrel , cycloSPORINE , doxycycline , ezetimibe , fluticasone , fluticasone -salmeterol, furosemide , gabapentin , glucose blood, hydrALAZINE , insulin  glargine (2 Unit Dial ), insulin  lispro, levETIRAcetam , meclizine , mirabegron  ER, nitroGLYCERIN , olmesartan -hydrochlorothiazide , oxyCODONE -acetaminophen , pantoprazole , potassium chloride  SA, rosuvastatin , and tiZANidine   History Review  Allergy: [name] is allergic to codeine, farxiga  [dapagliflozin ], and lactose intolerance (gi). Drug: [name]  reports no history of drug use. Alcohol :  reports that she does not currently use alcohol  after a past usage of about 1.0 standard drink of alcohol  per week. Tobacco:  reports that she quit smoking about 36 years ago. Her smoking use included cigarettes. She started smoking about 51 years ago. She has a 15 pack-year smoking history. She has never used smokeless tobacco. Social: [name]  reports that she quit smoking about 36 years ago. Her smoking use included cigarettes. She started smoking about 51 years ago. She has a 15 pack-year smoking history. She has never used smokeless tobacco. She reports that she does not currently use alcohol  after a past usage of about 1.0 standard drink of alcohol   per week. She reports that she does not use drugs. Medical:  has a past  medical history of Anxiety and depression (09/13/2006), Arthritis, Asthma, CHF (congestive heart failure) (HCC), COPD (chronic obstructive pulmonary disease) (HCC), Depression, Diabetes mellitus, Diabetic neuropathy, painful (HCC), Diabetic retinopathy (HCC), Diastolic dysfunction, Dilated aortic root, Dizziness, Epilepsy idiopathic petit mal (HCC) (09/13/2006), GERD (gastroesophageal reflux disease), Glaucoma, Headache(784.0), Heart murmur, History of stress test, blood clots, Hyperlipidemia, Hypertension, Hypertensive urgency (04/23/2021), Legally blind, MIGRAINE HEADACHE (09/14/2006), Myocardial infarction [facility]), Nephrolithiasis, Pain (09/23/2011), Pneumonia, Pyelonephritis, SBO (small bowel obstruction) (HCC) (04/23/2021), Seizures (HCC), Sleep apnea, Small bowel obstruction (HCC) (05/18/2021), Stress incontinence, Stroke (HCC), and Syncope (07/07/2021). Surgical: [name]  has a past surgical history that includes kidney stone removal; Brain hematoma evacuation; Shunt removal; Retinal detachment surgery (Left, 1990); Vaginal hysterectomy (1996); Incisional hernia repair (05/05/2011); Mass excision (05/05/2011); PCNL; Cystoscopy/retrograde/ureteroscopy (08/24/2011); Cystoscopy with ureteroscopy (09/24/2011); Cystoscopy w/ retrogrades (09/24/2011); RIGHT/LEFT HEART CATH AND CORONARY ANGIOGRAPHY (N/A, 04/28/2019); and laparotomy (N/A, 06/12/2021). Family: family history includes ADD / ADHD in her maternal grandfather; Birth defects in her daughter and maternal grandfather; Brain cancer in her maternal grandmother; Cancer in her mother; Cirrhosis in her maternal grandfather; Diabetes in her maternal grandfather; Hypertension in her daughter, maternal grandmother, and mother; Uterine cancer in her mother.  Laboratory Chemistry Profile   Renal Lab Results  Component Value Date   BUN 16 09/17/2023   CREATININE 0.83 09/17/2023   LABCREA 25 11/17/2021   BCR SEE NOTE: 08/27/2023   GFR 74.92 04/14/2022   GFRAA >60  05/30/2019   GFRNONAA >60 09/17/2023    Hepatic Lab Results  Component Value Date   AST 18 09/17/2023   ALT 17 09/17/2023   ALBUMIN  3.1 (L) 09/17/2023   ALKPHOS 79 09/17/2023   LIPASE 45 06/12/2023    Electrolytes Lab Results  Component Value Date   NA 140 09/17/2023   K 3.8 09/17/2023   CL 107 09/17/2023   CALCIUM  8.7 (L) 09/17/2023   MG 2.1 09/17/2023   PHOS 2.8 09/17/2023    Bone Lab Results  Component Value Date   VD25OH 33 11/17/2021    Inflammation (CRP: Acute Phase) (ESR: Chronic Phase) Lab Results  Component Value Date   CRP 16.7 (H) 06/14/2021   ESRSEDRATE 65 (H) 06/14/2021   LATICACIDVEN 1.6 08/03/2022         Note: Above Lab results reviewed.  Recent Imaging Review  DG Chest 2 View EXAM: 2 VIEW(S) XRAY OF THE CHEST 11/27/2023 10:22:03 AM  COMPARISON: 07/25/2023  CLINICAL HISTORY: cough and wheezing  FINDINGS:  LUNGS AND PLEURA: Stable somewhat low lung volumes. No focal pulmonary opacity. No pulmonary edema. No pleural effusion. No pneumothorax.  HEART AND MEDIASTINUM: No acute abnormality of the cardiac and mediastinal silhouettes.  BONES AND SOFT TISSUES: No acute fracture or destructive lesion. Multilevel thoracic osteophytosis.  IMPRESSION: 1. No acute cardiopulmonary process.  Electronically signed by: Helayne Hurst MD 11/27/2023 10:37 AM EST RP Workstation: [id number] Note: Reviewed        Physical Exam  Vitals: BP 104/63 (BP Location: Left Arm, Patient Position: Sitting)   Pulse 90   Temp (!) 97.2 F (36.2 C) (Temporal)   Resp 18   Ht 5' 5 (1.651 m)   Wt 249 lb (112.9 kg)   LMP  (LMP Unknown)   SpO2 96%   BMI 41.44 kg/m  BMI: Estimated body mass index is 41.44 kg/m as calculated from the following:   Height as of this encounter: 5' 5 (1.651  m).   Weight as of this encounter: 249 lb (112.9 kg). Ideal: Ideal body weight: 57 kg (125 lb 10.6 oz) Adjusted ideal body weight: 79.4 kg (175 lb) General appearance: Well  nourished, well developed, and well hydrated. In no apparent acute distress Mental status: Alert, oriented x 3 (person, place, & time)       Respiratory: No evidence of acute respiratory distress Eyes: PERLA  Musculoskeletal : +LBP Assessment   Diagnosis Status  1. Chronic pain syndrome   2. Central pain syndrome   3. Bilateral hip pain   4. Medication management   5. Diabetic polyneuropathy associated with type 2 diabetes mellitus (HCC)    Controlled Controlled Controlled   Updated Problems: No problems updated.  Plan of Care  Problem-specific:  Assessment and Plan    Monitoring: [place] PMP: PDMP not reviewed this encounter. {Blank single:19197::Unable to conduct review of the controlled substance reporting system due to technological failure.,     } Pharmacotherapy: {Blank single:19197::Opioid-induced constipation (OIC)(K59.03, T40.2X5A),No side-effects or adverse reactions reported.} Compliance: {Blank single:19197::Medication agreement violation - unsanctioned acquisition/use of additional opioid-containing medication,No problems identified.} Effectiveness: {Blank single:19197::Clinically acceptable.}  No notes on file  UDS:  Summary  Date Value Ref Range Status  10/14/2023 FINAL  Final    Comment:    ==================================================================== [employer] (MW) ==================================================================== Test                             Result       Flag       Units  Drug Absent but Declared for Prescription Verification   Oxycodone                       Not Detected UNEXPECTED ng/mg creat   Butalbital                      Not Detected UNEXPECTED ==================================================================== Test                      Result    Flag   Units      Ref Range   Creatinine              72               mg/dL       >=79 ==================================================================== Declared Medications:  The flagging and interpretation on this report are based on the  following declared medications.  Unexpected results may arise from  inaccuracies in the declared medications.   **Note: The testing scope of this panel includes these medications:   Butalbital  (Fioricet )  Oxycodone  (Percocet)   **Note: The testing scope of this panel does not include the  following reported medications:   Acetaminophen  (Fioricet )  Acetaminophen  (Percocet)  Albuterol  (AccuNeb )  Amitriptyline  (Elavil )  Amlodipine  (Norvasc )  Caffeine  (Fioricet )  Carvedilol  (Coreg )  Clonidine  (Catapres )  Clopidogrel  (Plavix )  Cyclosporine  (Restasis )  Dulaglutide  (Trulicity )  Duloxetine  (Cymbalta )  Epinephrine  (EpiPen )  Ezetimibe  (Zetia )  Fluticasone  (Wixela Inhub)  Furosemide  (Lasix )  Gabapentin  (Neurontin )  Glucagon  (Gvoke)  Hydralazine  (Apresoline )  Insulin  (Toujeo )  Levetiracetam  (Keppra )  Meclizine  (Antivert )  Mirabegron  (Myrbetriq )  Nitroglycerin  (Nitrostat )  Olmesartan  (Benicar )  Pantoprazole  (Protonix )  Potassium (Klor-Con )  Rosuvastatin  (Crestor )  Salmeterol (Wixela Inhub)  Tizanidine  (Zanaflex )  Ubrogepant  (Ubrelvy )  Vitamin D3 ==================================================================== For clinical consultation, please call [phone]. ====================================================================     No results found for: CBDTHCR No results found  for: D8THCCBX No results found for: D9THCCBX  ROS  Constitutional: {Blank single:19197::Denies any fever or chills} Gastrointestinal: {Blank single:19197::No reported hemesis, hematochezia, vomiting, or acute GI distress} Musculoskeletal: {Blank single:19197::Denies any acute onset joint swelling, redness, loss of ROM, or weakness} Neurological: {Blank single:19197::No reported episodes of acute onset apraxia,  aphasia, dysarthria, agnosia, amnesia, paralysis, loss of coordination, or loss of consciousness}  Medication Review  DULoxetine , Dulaglutide , EPINEPHrine , FreeStyle Libre 3 Plus Sensor, FreeStyle Libre 3 Sensor, Glucagon , Insulin  Pen Needle, Ubrogepant , albuterol , amLODipine , amitriptyline , butalbital -acetaminophen -caffeine , carvedilol , cholecalciferol , cloNIDine , clopidogrel , cycloSPORINE , ezetimibe , fluticasone , fluticasone -salmeterol, furosemide , gabapentin , glucose blood, hydrALAZINE , insulin  glargine (2 Unit Dial ), insulin  lispro, levETIRAcetam , meclizine , mirabegron  ER, nitroGLYCERIN , olmesartan -hydrochlorothiazide , pantoprazole , potassium chloride  SA, rosuvastatin , and tiZANidine   History Review  Allergy: [name] is allergic to codeine, farxiga  [dapagliflozin ], and lactose intolerance (gi). Drug: [name]  reports no history of drug use. Alcohol :  reports that she does not currently use alcohol  after a past usage of about 1.0 standard drink of alcohol  per week. Tobacco:  reports that she quit smoking about 37 years ago. Her smoking use included cigarettes. She started smoking about 52 years ago. She has a 15 pack-year smoking history. She has never used smokeless tobacco. Social: [name]  reports that she quit smoking about 37 years ago. Her smoking use included cigarettes. She started smoking about 52 years ago. She has a 15 pack-year smoking history. She has never used smokeless tobacco. She reports that she does not currently use alcohol  after a past usage of about 1.0 standard drink of alcohol  per week. She reports that she does not use drugs. Medical:  has a past medical history of Anxiety and depression (09/13/2006), Arthritis, Asthma, CHF (congestive heart failure) (HCC), COPD (chronic obstructive pulmonary disease) (HCC), Depression, Diabetes mellitus, Diabetic neuropathy, painful (HCC), Diabetic retinopathy (HCC), Diastolic dysfunction, Dilated aortic root, Dizziness, Epilepsy  idiopathic petit mal (HCC) (09/13/2006), GERD (gastroesophageal reflux disease), Glaucoma, Headache(784.0), Heart murmur, History of stress test, blood clots, Hyperlipidemia, Hypertension, Hypertensive urgency (04/23/2021), Legally blind, MIGRAINE HEADACHE (09/14/2006), Myocardial infarction [facility]), Nephrolithiasis, Pain (09/23/2011), Pneumonia, Pyelonephritis, SBO (small bowel obstruction) (HCC) (04/23/2021), Seizures (HCC), Sleep apnea, Small bowel obstruction (HCC) (05/18/2021), Stress incontinence, Stroke (HCC), and Syncope (07/07/2021). Surgical: [name]  has a past surgical history that includes kidney stone removal; Brain hematoma evacuation; Shunt removal; Retinal detachment surgery (Left, 1990); Vaginal hysterectomy (1996); Incisional hernia repair (05/05/2011); Mass excision (05/05/2011); PCNL; Cystoscopy/retrograde/ureteroscopy (08/24/2011); Cystoscopy with ureteroscopy (09/24/2011); Cystoscopy w/ retrogrades (09/24/2011); RIGHT/LEFT HEART CATH AND CORONARY ANGIOGRAPHY (N/A, 04/28/2019); and laparotomy (N/A, 06/12/2021). Family: family history includes ADD / ADHD in her maternal grandfather; Birth defects in her daughter and maternal grandfather; Brain cancer in her maternal grandmother; Cancer in her mother; Cirrhosis in her maternal grandfather; Diabetes in her maternal grandfather; Hypertension in her daughter, maternal grandmother, and mother; Uterine cancer in her mother.  Laboratory Chemistry Profile   Renal Lab Results  Component Value Date   BUN 14 01/19/2024   CREATININE 0.82 01/19/2024   LABCREA 96 01/24/2024   BCR SEE NOTE: 01/19/2024   GFR 74.92 04/14/2022   GFRAA >60 05/30/2019   GFRNONAA >60 09/17/2023    Hepatic Lab Results  Component Value Date   AST 13 01/19/2024   ALT 19 01/19/2024   ALBUMIN  3.1 (L) 09/17/2023   ALKPHOS 79 09/17/2023   LIPASE 45 06/12/2023    Electrolytes Lab Results  Component Value Date   NA 141 01/19/2024   K 4.1 01/19/2024   CL 105 01/19/2024  CALCIUM  9.0 01/19/2024   MG 2.1 09/17/2023   PHOS 2.8 09/17/2023    Bone Lab Results  Component Value Date   VD25OH 33 11/17/2021    Inflammation (CRP: Acute Phase) (ESR: Chronic Phase) Lab Results  Component Value Date   CRP 16.7 (H) 06/14/2021   ESRSEDRATE 65 (H) 06/14/2021   LATICACIDVEN 1.6 08/03/2022         Note: {Blank single:19197::[name] indicates labs done and monitored by primary care practitioner using a non-[facility] EMR system,No results found under the [facility] electronic medical record,Patient noncompliant with Lab Work orders.,Results made available to patient.,Lab results reviewed and made available to patient.,Lab results reviewed and explained to patient in Layman's terms.,Above Lab results reviewed.}  Recent Imaging Review  DG Chest 2 View EXAM: 2 VIEW(S) XRAY OF THE CHEST 11/27/2023 10:22:03 AM  COMPARISON: 07/25/2023  CLINICAL HISTORY: cough and wheezing  FINDINGS:  LUNGS AND PLEURA: Stable somewhat low lung volumes. No focal pulmonary opacity. No pulmonary edema. No pleural effusion. No pneumothorax.  HEART AND MEDIASTINUM: No acute abnormality of the cardiac and mediastinal silhouettes.  BONES AND SOFT TISSUES: No acute fracture or destructive lesion. Multilevel thoracic osteophytosis.  IMPRESSION: 1. No acute cardiopulmonary process.  Electronically signed by: Helayne Hurst MD 11/27/2023 10:37 AM EST RP Workstation: [id number] Note: {Blank single:19197::No new results found.,No results found under the [facility] electronic medical record.,Imaging results reviewed and explained to patient in Layman's terms.,Results of ordered imaging test(s) reviewed and explained to patient in Layman's terms.,Imaging results reviewed.,Reviewed} {Blank single:19197::Results visible under [facility].,Results visible under [facility].,Results visible under [facility].,Results visible under [facility].,Results visible under  Care Everywhere.,Results made available to patient.,Copy of results provided to patient.,Patient noncompliant with diagnostic imaging orders.,     }  Physical Exam  Vitals: LMP  (LMP Unknown)  BMI: Estimated body mass index is 42.43 kg/m as calculated from the following:   Height as of 01/24/24: 5' 5 (1.651 m).   Weight as of 01/24/24: 255 lb (115.7 kg). Ideal: Ideal body weight: 57 kg (125 lb 10.6 oz) Adjusted ideal body weight: 80.5 kg (177 lb 6.4 oz) General appearance: {general exam:210120802::Well nourished, well developed, and well hydrated. In no apparent acute distress} Mental status: {Blank single:19197::Alert and oriented x 3. Exaggerated physical and/or psychosocial pain behavior perceived.,Alert, oriented x 3 (person, place, & time)} {Blank single:19197::[name]'s speech pattern and demeanor seems to suggest oversedation,     } Respiratory: {Blank single:19197::Oxygen-dependent COPD,No evidence of acute respiratory distress} Eyes: {Blank single:19197::Miotic (pupilary constriction) due to opiate use,Midriatic,Anisocoric,Evidence of ptosis,Pin-point pupils,PERLA}   Assessment   Diagnosis Status  No diagnosis found. {Blank single:19197::Absent,Deteriorating,Having a Flare-up,Improved,Improving,Not improving,Not responding,Persistent,Present,Recurring,Reoccurring,Resolved,Responding,Stable,Unchanged,improved,Worsened,Worsening,Controlled} {Blank single:19197::Absent,Deteriorating,Having a Flare-up,Improved,Improving,Not improving,Not responding,Persistent,Present,Recurring,Reoccurring,Resolved,Responding,Stable,Unchanged,improved,Worsened,Worsening,Controlled} {Blank single:19197::Absent,Deteriorating,Having a Flare-up,Improved,Improving,Not improving,Not  responding,Persistent,Present,Recurring,Reoccurring,Resolved,Responding,Stable,Unchanged,improved,Worsened,Worsening,Controlled}   Updated Problems: No problems updated.  Plan of Care  Problem-specific:  Assessment and Plan            Ms. Angalina Vasha Siegel has a current medication list which includes the following long-term medication(s): albuterol , albuterol , amitriptyline , amlodipine , duloxetine , ezetimibe , furosemide , gabapentin , toujeo  max solostar, insulin  lispro, levetiracetam , mirabegron  er, olmesartan -hydrochlorothiazide , pantoprazole , rosuvastatin , and wixela inhub.  Pharmacotherapy (Medications Ordered): No orders of the defined types were placed in this encounter.  Orders:  No orders of the defined types were placed in this encounter.    {There is no content from the last Plan section.}   No follow-ups on file.    Recent Visits Date Type Provider Dept  12/06/23 Office Visit Annalee Meyerhoff K, NP [facility]  11/18/23 Office Visit Taylorann Tkach K, NP [facility]  Showing recent visits within past 90 days and meeting all other requirements Future Appointments Date Type Provider Dept  02/01/24 Appointment Haya Hemler K, NP [facility]  Showing future appointments within next 90 days and meeting all other requirements  I discussed the assessment and treatment plan with the patient. The patient was provided an opportunity to ask questions and all were answered. The patient agreed with the plan and demonstrated an understanding of the instructions.  Patient advised to call back or seek an in-person evaluation if the symptoms or condition worsens.  Duration of encounter: *** minutes.  Total time on encounter, as per AMA guidelines included both the face-to-face and non-face-to-face time personally spent by the physician and/or other qualified health care professional(s) on the day of the encounter (includes time in  activities that require the physician or other qualified health care professional and does not include time in activities normally performed by clinical staff). Physician's time may include the following activities when performed: Preparing to see the patient (e.g., pre-charting review of records, searching for previously ordered imaging, lab work, and nerve conduction tests) Review of prior analgesic pharmacotherapies. Reviewing PMP Interpreting ordered tests (e.g., lab work, imaging, nerve conduction tests) Performing post-procedure evaluations, including interpretation of diagnostic procedures Obtaining and/or reviewing separately obtained history Performing a medically appropriate examination and/or evaluation Counseling and educating the patient/family/caregiver Ordering medications, tests, or procedures Referring and communicating with other health care professionals (when not separately reported) Documenting clinical information in the electronic or other health record Independently interpreting results (not separately reported) and communicating results to the patient/ family/caregiver Care coordination (not separately reported)  Note by: Aurelio Mccamy K Lonetta Blassingame, NP (TTS and AI technology used. I apologize for any typographical errors that were not detected and corrected.) Date: 02/01/2024; Time: 8:15 PM

## 2024-02-01 ENCOUNTER — Ambulatory Visit: Attending: Nurse Practitioner | Admitting: Nurse Practitioner

## 2024-02-01 ENCOUNTER — Encounter: Payer: Self-pay | Admitting: Nurse Practitioner

## 2024-02-01 VITALS — BP 189/95 | HR 88 | Temp 97.7°F | Resp 16 | Ht 65.0 in | Wt 249.0 lb

## 2024-02-01 DIAGNOSIS — E1140 Type 2 diabetes mellitus with diabetic neuropathy, unspecified: Secondary | ICD-10-CM | POA: Diagnosis present

## 2024-02-01 DIAGNOSIS — G894 Chronic pain syndrome: Secondary | ICD-10-CM | POA: Diagnosis present

## 2024-02-01 DIAGNOSIS — M25551 Pain in right hip: Secondary | ICD-10-CM | POA: Diagnosis present

## 2024-02-01 DIAGNOSIS — M25552 Pain in left hip: Secondary | ICD-10-CM | POA: Diagnosis present

## 2024-02-01 DIAGNOSIS — M25512 Pain in left shoulder: Secondary | ICD-10-CM | POA: Diagnosis present

## 2024-02-01 DIAGNOSIS — Z79899 Other long term (current) drug therapy: Secondary | ICD-10-CM | POA: Diagnosis present

## 2024-02-01 DIAGNOSIS — G43E01 Chronic migraine with aura, not intractable, with status migrainosus: Secondary | ICD-10-CM | POA: Insufficient documentation

## 2024-02-01 DIAGNOSIS — G890 Central pain syndrome: Secondary | ICD-10-CM | POA: Diagnosis present

## 2024-02-01 DIAGNOSIS — E1142 Type 2 diabetes mellitus with diabetic polyneuropathy: Secondary | ICD-10-CM | POA: Diagnosis present

## 2024-02-01 DIAGNOSIS — G8911 Acute pain due to trauma: Secondary | ICD-10-CM | POA: Insufficient documentation

## 2024-02-01 DIAGNOSIS — Z794 Long term (current) use of insulin: Secondary | ICD-10-CM | POA: Diagnosis not present

## 2024-02-01 MED ORDER — GABAPENTIN 600 MG PO TABS: ORAL_TABLET | ORAL | 5 refills | Status: AC

## 2024-02-01 MED ORDER — OXYCODONE-ACETAMINOPHEN 5-325 MG PO TABS: 1.0000 | ORAL_TABLET | Freq: Three times a day (TID) | ORAL | 0 refills | Status: AC | PRN

## 2024-02-01 NOTE — Patient Instructions (Signed)
 qUTENZA  LITERATURE GIVEN

## 2024-02-01 NOTE — Progress Notes (Signed)
 Nursing Pain Medication Assessment:  Safety precautions to be maintained throughout the outpatient stay will include: orient to surroundings, keep bed in low position, maintain call bell within reach at all times, provide assistance with transfer out of bed and ambulation.  Medication Inspection Compliance: Pill count conducted under aseptic conditions, in front of the patient. Neither the pills nor the bottle was removed from the patient's sight at any time. Once count was completed pills were immediately returned to the patient in their original bottle.  Medication: Oxycodone /APAP Pill/Patch Count: 41 of 90 pills/patches remain Pill/Patch Appearance: Markings consistent with prescribed medication Bottle Appearance: Standard pharmacy container. Clearly labeled. Filled Date: 84 / 17 / 2025 Last Medication intake:  Today

## 2024-02-06 ENCOUNTER — Encounter: Payer: Self-pay | Admitting: Student in an Organized Health Care Education/Training Program

## 2024-02-07 ENCOUNTER — Ambulatory Visit: Admitting: Student in an Organized Health Care Education/Training Program

## 2024-02-08 ENCOUNTER — Encounter: Payer: Self-pay | Admitting: Nurse Practitioner

## 2024-02-08 ENCOUNTER — Emergency Department (HOSPITAL_COMMUNITY)

## 2024-02-08 ENCOUNTER — Encounter: Payer: Self-pay | Admitting: "Endocrinology

## 2024-02-08 ENCOUNTER — Emergency Department (HOSPITAL_COMMUNITY)
Admission: EM | Admit: 2024-02-08 | Discharge: 2024-02-08 | Disposition: A | Discharge status: Home / Self Care | Attending: Emergency Medicine | Admitting: Emergency Medicine

## 2024-02-08 ENCOUNTER — Other Ambulatory Visit: Payer: Self-pay

## 2024-02-08 DIAGNOSIS — E1165 Type 2 diabetes mellitus with hyperglycemia: Secondary | ICD-10-CM

## 2024-02-08 DIAGNOSIS — E119 Type 2 diabetes mellitus without complications: Secondary | ICD-10-CM | POA: Diagnosis not present

## 2024-02-08 DIAGNOSIS — J205 Acute bronchitis due to respiratory syncytial virus: Secondary | ICD-10-CM | POA: Insufficient documentation

## 2024-02-08 DIAGNOSIS — J45909 Unspecified asthma, uncomplicated: Secondary | ICD-10-CM | POA: Diagnosis not present

## 2024-02-08 DIAGNOSIS — Z794 Long term (current) use of insulin: Secondary | ICD-10-CM | POA: Insufficient documentation

## 2024-02-08 DIAGNOSIS — I10 Essential (primary) hypertension: Secondary | ICD-10-CM | POA: Diagnosis not present

## 2024-02-08 DIAGNOSIS — K219 Gastro-esophageal reflux disease without esophagitis: Secondary | ICD-10-CM

## 2024-02-08 DIAGNOSIS — R059 Cough, unspecified: Secondary | ICD-10-CM | POA: Diagnosis present

## 2024-02-08 LAB — RESP PANEL BY RT-PCR (RSV, FLU A&B, COVID)  RVPGX2
Influenza A by PCR: NEGATIVE
Influenza B by PCR: NEGATIVE
Resp Syncytial Virus by PCR: POSITIVE — AB
SARS Coronavirus 2 by RT PCR: NEGATIVE

## 2024-02-08 MED ORDER — ACETAMINOPHEN 500 MG PO TABS
1000.0000 mg | ORAL_TABLET | ORAL | Status: AC
  Administered 2024-02-08: 1000 mg via ORAL
  Filled 2024-02-08: qty 2

## 2024-02-08 MED ORDER — TOUJEO MAX SOLOSTAR 300 UNIT/ML ~~LOC~~ SOPN: 72.0000 [IU] | PEN_INJECTOR | Freq: Every evening | SUBCUTANEOUS | 0 refills | Status: AC

## 2024-02-08 MED ORDER — PREDNISONE 20 MG PO TABS: 20.0000 mg | ORAL_TABLET | Freq: Every day | ORAL | 0 refills | Status: AC

## 2024-02-08 MED ORDER — INSULIN LISPRO (1 UNIT DIAL) 100 UNIT/ML (KWIKPEN): PEN_INJECTOR | SUBCUTANEOUS | 3 refills | Status: AC

## 2024-02-08 MED ORDER — BENZONATATE 100 MG PO CAPS: 100.0000 mg | ORAL_CAPSULE | Freq: Three times a day (TID) | ORAL | 0 refills | Status: AC

## 2024-02-08 MED ORDER — PANTOPRAZOLE SODIUM 40 MG PO TBEC: 40.0000 mg | DELAYED_RELEASE_TABLET | Freq: Every day | ORAL | 2 refills | Status: AC | PRN

## 2024-02-08 MED ORDER — OLMESARTAN MEDOXOMIL-HCTZ 40-12.5 MG PO TABS: 1.0000 | ORAL_TABLET | Freq: Every day | ORAL | 0 refills | Status: AC

## 2024-02-08 MED ORDER — TRULICITY 4.5 MG/0.5ML ~~LOC~~ SOAJ: 4.5000 mg | SUBCUTANEOUS | 3 refills | Status: AC

## 2024-02-08 MED ORDER — FREESTYLE LIBRE 3 PLUS SENSOR MISC: 3 refills | Status: AC

## 2024-02-08 NOTE — ED Provider Notes (Signed)
 " [place] EMERGENCY DEPARTMENT AT [facility] Provider Note   CSN: [id number] Arrival date & time: 02/08/24  0901     Patient presents with: Cough, bodyaches, and Nasal Congestion   [name] is a 59 y.o. female.  {Add pertinent medical, surgical, social history, OB history to HPI:32947} Several other family members sick with RSV       Prior to Admission medications  Medication Sig Start Date End Date Taking? Authorizing Provider  ACCU-CHEK GUIDE TEST test strip USE 1 STRIP IN THE MORNING, AT  NOON, AND AT BEDTIME 11/01/23   Dartha Ernst, MD  albuterol  (ACCUNEB ) 1.25 MG/3ML nebulizer solution USE 1 VIAL VIA NEBULIZER TWICE  DAILY 10/26/23   [name], [name], [name]  albuterol  (VENTOLIN  HFA) 108 (90 Base) MCG/ACT inhaler USE 1 TO 2 INHALATIONS BY MOUTH  EVERY 6 HOURS AS NEEDED 11/29/23   [name], [name], [name]  amitriptyline  (ELAVIL ) 25 MG tablet Take 2-3 tablets (50-75 mg total) by mouth at bedtime. 11/23/23 05/21/24  [name], [name], NP  amLODipine  (NORVASC ) 10 MG tablet Take 1 tablet (10 mg total) by mouth daily. 10/14/23   [name], [name], [name]  butalbital -acetaminophen -caffeine  (FIORICET ) 50-325-40 MG tablet Take 1-2 tablets by mouth every 6 (six) hours as needed for headache. 04/26/23 04/25/24  Marcelino Nurse, MD  carvedilol  (COREG ) 25 MG tablet Take 25 mg by mouth 2 (two) times daily. 03/06/21   [provider]  cholecalciferol  (VITAMIN D3) 25 MCG (1000 UNIT) tablet Take 1 tablet (1,000 Units total) by mouth daily. 11/02/22   [name], [name], [name]  cloNIDine  (CATAPRES ) 0.1 MG tablet Take 0.1 mg by mouth daily as needed. 09/27/23   [provider]  clopidogrel  (PLAVIX ) 75 MG tablet Take 1 tablet (75 mg total) by mouth daily. 10/10/19   [name], MD  Continuous Glucose Sensor (FREESTYLE LIBRE 3 PLUS SENSOR) MISC Inject 1 Device into the skin continuous. Change every 14 days 10/12/23   [name], [name]  Continuous Glucose Sensor (FREESTYLE LIBRE 3 PLUS  SENSOR) MISC Change sensor every 15 days. 12/13/23   [name], [name], MD  Continuous Glucose Sensor (FREESTYLE LIBRE 3 SENSOR) MISC 1 Device by Does not apply route continuous. 09/25/22   [name], [name], MD  cycloSPORINE  (RESTASIS ) 0.05 % ophthalmic emulsion Place 2 drops into both eyes 2 (two) times daily.    [provider]  DULoxetine  (CYMBALTA ) 60 MG capsule Take 1 capsule (60 mg total) by mouth daily. 10/14/23   Gareth Mliss FALCON, [name]  EPINEPHrine  0.3 mg/0.3 mL IJ SOAJ injection INJECT 1 PEN INTRAMUSCULARLY AS  NEEDED FOR ALLERGIC RESPONSE AS  DIRECTED BY MD. [facility]  HELP AFTER USE 10/26/23   [name], [name], [name]  ezetimibe  (ZETIA ) 10 MG tablet Take 1 tablet (10 mg total) by mouth daily. 10/14/23   [name], [name], [name]  fluticasone  (FLONASE ) 50 MCG/ACT nasal spray Place into both nostrils. 11/28/23   [provider]  furosemide  (LASIX ) 20 MG tablet Take 1 tablet (20 mg total) by mouth daily. 10/14/23   [name], [name], [name]  gabapentin  (NEURONTIN ) 600 MG tablet 600 mg qAM, 600 mg qPM, 1200 mg QHS 02/01/24   [name], [name], NP  GVOKE HYPOPEN  2-PACK 1 MG/0.2ML SOAJ INJECT SUBCUTANEOUSLY 1MG  ONCE  AS NEEDED FOR SEVERE LOW BLOOD  SUGAR NOT RESPONDING TO DRINKING JUICE/GLUCOSE TABS, WHEN BG &lt; 70 10/25/23   [name], [name], MD  hydrALAZINE  (APRESOLINE ) 50 MG tablet Take 1 tablet by mouth 3 (three) times daily. 05/22/22  [provider]  insulin  glargine, 2 Unit Dial , (TOUJEO  MAX SOLOSTAR) 300 UNIT/ML Solostar Pen Inject 50 Units into the skin at bedtime. Plus sliding scale, max dose 80 units a day Patient taking differently: Inject 80 Units into the skin at bedtime. Plus sliding scale, max dose 80 units a day 06/15/23   [name], [name], MD  insulin  lispro (HUMALOG  KWIKPEN) 100 UNIT/ML KwikPen INJECT 15 UNITS INTO THE SKIN 3 TIMES DAILY PLUS SLIDING SCALE, MAX DOSE 80 UNITS DAILY. 12/09/23   [name], [name], MD  Insulin  Pen Needle (EMBECTA PEN NEEDLE NANO 2 GEN) 32G X 4 MM MISC 1 Needle  by Other route 3 (three) times daily. 01/07/24   [name], [name], MD  levETIRAcetam  (KEPPRA ) 500 MG tablet Take 500-750 mg by mouth See admin instructions. Take 1 tablet (500mg ) by mouth every morning and take 1 tablets by mouth (750mg ) by mouth every night    [provider]  meclizine  (ANTIVERT ) 25 MG tablet Take 1 tablet (25 mg total) by mouth 3 (three) times daily as needed for dizziness. 06/16/22   [name], MD  mirabegron  ER (MYRBETRIQ ) 50 MG TB24 tablet Take 1 tablet (50 mg total) by mouth daily. 10/14/23   [name], [name], [name]  nitroGLYCERIN  (NITROSTAT ) 0.4 MG SL tablet Place 0.4 mg under the tongue every 5 (five) minutes as needed for chest pain. 06/10/23   [provider]  olmesartan -hydrochlorothiazide  (BENICAR  HCT) 40-12.5 MG tablet Take 1 tablet by mouth daily. 10/20/23   Sowles, Krichna, MD  oxyCODONE -acetaminophen  (PERCOCET) 5-325 MG tablet Take 1 tablet by mouth every 8 (eight) hours as needed for severe pain (pain score 7-10). Must last 30 days. 02/01/24 03/02/24  [name], [name], NP  pantoprazole  (PROTONIX ) 40 MG tablet TAKE 1 TABLET BY MOUTH DAILY AS  NEEDED 07/30/23   [name], [name], [name]  potassium chloride  SA (KLOR-CON  M) 20 MEQ tablet Take 40 mEq by mouth. 10/04/23   [provider]  rosuvastatin  (CRESTOR ) 40 MG tablet TAKE 1 TABLET BY MOUTH DAILY 08/17/23   [name], [name], MD  tiZANidine  (ZANAFLEX ) 4 MG tablet Take 1 tablet (4 mg total) by mouth every 8 (eight) hours as needed for muscle spasms. 11/18/23   [name], [name], NP  TRULICITY  4.5 MG/0.5ML SOAJ INJECT THE CONTENTS OF ONE PEN  SUBCUTANEOUSLY WEEKLY AS  DIRECTED 01/21/24   Dartha Ernst, MD  Ubrogepant  (UBRELVY ) 50 MG TABS 50 mg or 100 mg taken orally with or without food. If needed, a second dose may be taken at least 2 hours after the initial dose. The maximum dose in a 24-hour period is 200 mg 02/18/23   Marcelino Nurse, MD  WIXELA INHUB 250-50 MCG/DOSE AEPB Inhale 1 puff into the lungs 2 (two) times daily.  01/22/20   [provider]    Allergies: Codeine, Farxiga  [dapagliflozin ], and Lactose intolerance (gi)    Review of Systems  Updated Vital Signs BP 102/78   Pulse (!) 111   Temp 98.6 F (37 C)   Resp 18   LMP  (LMP Unknown)   SpO2 100%   Physical Exam  (all labs ordered are listed, but only abnormal results are displayed) Labs Reviewed  RESP PANEL BY RT-PCR (RSV, FLU A&B, COVID)  RVPGX2 - Abnormal; Notable for the following components:      Result Value   Resp Syncytial Virus by PCR POSITIVE (*)    All other components within normal limits    EKG: None  Radiology: DG Chest 2 View Result Date:  02/08/2024 CLINICAL DATA:  Cough, fever EXAM: CHEST - 2 VIEW COMPARISON:  November 27, 2023 FINDINGS: The heart size and mediastinal contours are within normal limits. Both lungs are clear. The visualized skeletal structures are unremarkable. IMPRESSION: No active cardiopulmonary disease. Electronically Signed   By: Lynwood Landy Raddle M.D.   On: 02/08/2024 10:15    {Document cardiac monitor, telemetry assessment procedure when appropriate:32947} Procedures   Medications Ordered in the ED  acetaminophen  (TYLENOL ) tablet 1,000 mg (1,000 mg Oral Given 02/08/24 1005)      {Click here for ABCD2, HEART and other calculators REFRESH Note before signing:1}                              Medical Decision Making Amount and/or Complexity of Data Reviewed Radiology: ordered.  Risk OTC drugs.   ***  {Document critical care time when appropriate  Document review of labs and clinical decision tools ie CHADS2VASC2, etc  Document your independent review of radiology images and any outside records  Document your discussion with family members, caretakers and with consultants  Document social determinants of health affecting pt's care  Document your decision making why or why not admission, treatments were needed:32947:::1}   Final diagnoses:  None    ED Discharge Orders     None         "

## 2024-02-08 NOTE — Discharge Instructions (Signed)
 You were seen for your RSV infection in the emergency department.   At home, please use your inhalers for any wheezing.  Please take the steroids we have prescribed you for your asthma exacerbation as well.  Use the Tessalon  Perles for your cough  Check your MyChart online for the results of any tests that had not resulted by the time you left the emergency department.   Follow-up with your primary doctor in 2-3 days regarding your visit.    Return immediately to the emergency department if you experience any of the following: Difficulty breathing, chest pain, or any other concerning symptoms.    Thank you for visiting our Emergency Department. It was a pleasure taking care of you today.

## 2024-02-08 NOTE — ED Triage Notes (Signed)
 Pt. Stated, I've had a cough, nasal congestion and my body hurts all over for 2 days.

## 2024-02-08 NOTE — ED Provider Triage Note (Signed)
 Emergency Medicine Provider Triage Evaluation Note  [name] , a 59 y.o. female hx of bronchitis was evaluated in triage.  Pt complains of subjective fevers, congestion, cough, myalgias for 3 days.  Review of Systems  Positive:  Negative:   Physical Exam  BP 102/78   Pulse (!) 111   Temp 98.6 F (37 C)   Resp 18   LMP  (LMP Unknown)   SpO2 100%  Gen:   Awake, no distress   Resp:  Normal effort, no wheezing noted MSK:   Moves extremities without difficulty  Other:    Medical Decision Making  Medically screening exam initiated at 9:25 AM.  Appropriate orders placed.  [name] was informed that the remainder of the evaluation will be completed by another provider, this initial triage assessment does not replace that evaluation, and the importance of remaining in the ED until their evaluation is complete.    Yolande Lamar BROCKS, MD 02/08/24 [phone]

## 2024-02-11 ENCOUNTER — Encounter: Payer: Self-pay | Admitting: Nurse Practitioner

## 2024-02-11 ENCOUNTER — Ambulatory Visit: Payer: Self-pay | Admitting: Nurse Practitioner

## 2024-02-11 ENCOUNTER — Ambulatory Visit
Admission: RE | Admit: 2024-02-11 | Discharge: 2024-02-11 | Disposition: A | Attending: Nurse Practitioner | Admitting: Nurse Practitioner

## 2024-02-11 ENCOUNTER — Ambulatory Visit: Admitting: Nurse Practitioner

## 2024-02-11 ENCOUNTER — Ambulatory Visit
Admission: RE | Admit: 2024-02-11 | Discharge: 2024-02-11 | Disposition: A | Source: Ambulatory Visit | Attending: Nurse Practitioner

## 2024-02-11 VITALS — BP 145/88 | HR 100 | Temp 98.0°F | Ht 65.0 in | Wt 250.0 lb

## 2024-02-11 DIAGNOSIS — R0602 Shortness of breath: Secondary | ICD-10-CM

## 2024-02-11 DIAGNOSIS — B338 Other specified viral diseases: Secondary | ICD-10-CM

## 2024-02-11 DIAGNOSIS — Z09 Encounter for follow-up examination after completed treatment for conditions other than malignant neoplasm: Secondary | ICD-10-CM

## 2024-02-11 MED ORDER — AZITHROMYCIN 250 MG PO TABS: ORAL_TABLET | ORAL | 0 refills | Status: AC

## 2024-02-11 NOTE — Progress Notes (Signed)
 "  BP (!) 145/88   Pulse 100   Temp 98 F (36.7 C)   Ht 5' 5 (1.651 m)   Wt 250 lb (113.4 kg)   LMP  (LMP Unknown)   SpO2 99%   BMI 41.60 kg/m    Subjective:    Patient ID: [name], female    DOB: [birth year], 59 y.o.   MRN: [id number]  HPI: [name] is a 59 y.o. female  Chief Complaint  Patient presents with   Medical Management of Chronic Issues   Discussed the use of AI scribe software for clinical note transcription with the patient, who gave verbal consent to proceed.  History of Present Illness [name] is a 59 year old female who presents for follow-up after an ER visit for RSV.  Respiratory symptoms - Cough persists despite use of Tessalon  Perles - Shortness of breath continues - Uses nebulizer in the morning, at night, and sometimes upon waking in the middle of the night - No relief of cough with Tessalon  Perles - Chest x-ray performed in the ER was negative  Constitutional symptoms - No fever - Cold sweats and sensation of feeling feverish - Daughter observed sweating and shivering without a recorded fever  Recent rsv infection - Tested positive for RSV on February 08, 2024, in the emergency department - Discharged with prescriptions for prednisone  and Tessalon  Perles - Continues to take prednisone  as prescribed  Glycemic control - History of elevated A1c - Most recent A1c was 8.6 on December 13, 2023 - Followed by endocrinology for glycemic management  Transportation barriers - Does not drive - Experiences transportation challenges     EXAM: CHEST - 2 VIEW   COMPARISON:  November 27, 2023   FINDINGS: The heart size and mediastinal contours are within normal limits. Both lungs are clear. The visualized skeletal structures are unremarkable.   IMPRESSION: No active cardiopulmonary disease.    02/01/2024    8:48 AM 12/06/2023    9:19 AM 11/18/2023    8:55 AM  Depression screen PHQ 2/9   Decreased Interest 0 0 0  Down, Depressed, Hopeless 0 0 0  PHQ - 2 Score 0 0 0    Relevant past medical, surgical, family and social history reviewed and updated as indicated. Interim medical history since our last visit reviewed. Allergies and medications reviewed and updated.  Review of Systems  Constitutional: Negative for fever or weight change.  Respiratory: Negative for cough and shortness of breath.   Cardiovascular: Negative for chest pain or palpitations.  Gastrointestinal: Negative for abdominal pain, no bowel changes.  Musculoskeletal: Negative for gait problem or joint swelling.  Skin: Negative for rash.  Neurological: Negative for dizziness or headache.  No other specific complaints in a complete review of systems (except as listed in HPI above).      Objective:      BP (!) 145/88   Pulse 100   Temp 98 F (36.7 C)   Ht 5' 5 (1.651 m)   Wt 250 lb (113.4 kg)   LMP  (LMP Unknown)   SpO2 99%   BMI 41.60 kg/m    Wt Readings from Last 3 Encounters:  02/11/24 250 lb (113.4 kg)  02/01/24 249 lb (112.9 kg)  01/24/24 255 lb (115.7 kg)    Physical Exam GENERAL: Alert, cooperative, well developed, no acute distress. HEENT: Normocephalic, normal oropharynx, moist mucous membranes. CHEST: Abnormal breath sounds present. Rhonchi and wheezing noted CARDIOVASCULAR: Normal heart rate  and rhythm, S1 and S2 normal without murmurs. ABDOMEN: Soft, non-tender, non-distended, without organomegaly, normal bowel sounds. EXTREMITIES: No cyanosis or edema. NEUROLOGICAL: Cranial nerves grossly intact, moves all extremities without gross motor or sensory deficit.  Results for orders placed or performed during the hospital encounter of 02/08/24  Resp panel by RT-PCR (RSV, Flu A&B, Covid) Anterior Nasal Swab   Collection Time: 02/08/24  9:16 AM   Specimen: Anterior Nasal Swab  Result Value Ref Range   SARS Coronavirus 2 by RT PCR NEGATIVE NEGATIVE   Influenza A by PCR NEGATIVE  NEGATIVE   Influenza B by PCR NEGATIVE NEGATIVE   Resp Syncytial Virus by PCR POSITIVE (A) NEGATIVE   *Note: Due to a large number of results and/or encounters for the requested time period, some results have not been displayed. A complete set of results can be found in Results Review.          Assessment & Plan:   Problem List Items Addressed This Visit   None Visit Diagnoses       Infection due to respiratory syncytial virus (RSV), unspecified infection type    -  Primary   Relevant Medications   azithromycin (ZITHROMAX) 250 MG tablet     Hospital discharge follow-up         Shortness of breath       Relevant Medications   azithromycin (ZITHROMAX) 250 MG tablet   Other Relevant Orders   DG Chest 2 View        Assessment and Plan Assessment & Plan Respiratory syncytial virus (RSV) infection RSV infection diagnosed on January 20th, 2026, with symptoms of cough, body aches, and nasal congestion. Currently experiencing shortness of breath and frequent nebulizer use. No fever, but reports feeling feverish with cold sweats and shivering. Lungs sound poor on examination. - Continue prednisone  as prescribed - Use nebulizer as needed  Rule out pneumonia Concern for possible pneumonia due to worsening respiratory symptoms and poor lung sounds. Previous chest x-ray was normal, but symptoms have progressed. No fever, but reports feeling feverish with cold sweats and shivering. - Ordered stat chest x-ray to rule out pneumonia - Started azithromycin (Z-Pak) for typical pneumonia - If chest x-ray is positive for pneumonia, will prescribe Augmentin   Shortness of breath Persistent shortness of breath requiring frequent nebulizer use. No fever, but reports feeling feverish with cold sweats and shivering. - Continue using nebulizer as needed - Monitor for worsening shortness of breath and report to emergency department if it occurs  Hospital discharge follow-up Follow-up after  discharge from the emergency department on January 20th, 2026, for RSV infection. Symptoms have not improved, and there is concern for possible pneumonia. - Coordinated transportation for chest x-ray        Follow up plan: Return if symptoms worsen or fail to improve. "

## 2024-02-12 ENCOUNTER — Other Ambulatory Visit: Payer: Self-pay | Admitting: Nurse Practitioner

## 2024-02-12 DIAGNOSIS — F330 Major depressive disorder, recurrent, mild: Secondary | ICD-10-CM

## 2024-02-12 DIAGNOSIS — E1142 Type 2 diabetes mellitus with diabetic polyneuropathy: Secondary | ICD-10-CM

## 2024-02-14 ENCOUNTER — Other Ambulatory Visit: Payer: Self-pay | Admitting: Nurse Practitioner

## 2024-02-14 DIAGNOSIS — E1142 Type 2 diabetes mellitus with diabetic polyneuropathy: Secondary | ICD-10-CM

## 2024-02-14 DIAGNOSIS — G894 Chronic pain syndrome: Secondary | ICD-10-CM

## 2024-02-14 DIAGNOSIS — G890 Central pain syndrome: Secondary | ICD-10-CM

## 2024-02-14 NOTE — Telephone Encounter (Signed)
 Requested Prescriptions  Refused Prescriptions Disp Refills   DULoxetine  (CYMBALTA ) 60 MG capsule [Pharmacy Med Name: DULoxetine  HCl 60 MG Oral Capsule Delayed Release Particles] 90 capsule 3    Sig: TAKE 1 CAPSULE BY MOUTH DAILY     Psychiatry: Antidepressants - SNRI - duloxetine  Failed - 02/14/2024  1:40 PM      Failed - Last BP in normal range    BP Readings from Last 1 Encounters:  02/11/24 (!) 145/88         Passed - Cr in normal range and within 360 days    Creat  Date Value Ref Range Status  01/19/2024 0.82 0.50 - 1.03 mg/dL Final   Creatinine, Urine  Date Value Ref Range Status  01/24/2024 96 20 - 275 mg/dL Final         Passed - eGFR is 30 or above and within 360 days    GFR, Est African American  Date Value Ref Range Status  11/28/2018 94 > OR = 60 mL/min/1.38m2 Final   GFR calc Af Amer  Date Value Ref Range Status  05/30/2019 >60 >60 mL/min Final   GFR, Est Non African American  Date Value Ref Range Status  11/28/2018 81 > OR = 60 mL/min/1.76m2 Final   GFR, Estimated  Date Value Ref Range Status  09/17/2023 >60 >60 mL/min Final    Comment:    (NOTE) Calculated using the CKD-EPI Creatinine Equation (2021)    GFR  Date Value Ref Range Status  04/14/2022 74.92 >60.00 mL/min Final    Comment:    Calculated using the CKD-EPI Creatinine Equation (2021)   eGFR  Date Value Ref Range Status  01/19/2024 82 > OR = 60 mL/min/1.82m2 Final         Passed - Completed PHQ-2 or PHQ-9 in the last 360 days      Passed - Valid encounter within last 6 months    Recent Outpatient Visits           3 days ago Infection due to respiratory syncytial virus (RSV), unspecified infection type   [facility] Gareth Mliss FALCON, [name]   4 months ago Slurred speech   [facility] Gareth Mliss FALCON, [name]   5 months ago Encounter for support and coordination of transition of care   [facility] [name],  [name]   8 months ago Uncontrolled hypertension   [facility] Glenard Mire, MD   11 months ago AKI (acute kidney injury)   [facility] [name], [name]

## 2024-02-16 ENCOUNTER — Ambulatory Visit: Admitting: Nurse Practitioner

## 2024-02-18 ENCOUNTER — Other Ambulatory Visit: Payer: Self-pay | Admitting: Nurse Practitioner

## 2024-02-18 ENCOUNTER — Encounter: Payer: Self-pay | Admitting: Nurse Practitioner

## 2024-02-21 ENCOUNTER — Other Ambulatory Visit: Payer: Self-pay | Admitting: Nurse Practitioner

## 2024-02-21 DIAGNOSIS — E1142 Type 2 diabetes mellitus with diabetic polyneuropathy: Secondary | ICD-10-CM

## 2024-02-21 DIAGNOSIS — F330 Major depressive disorder, recurrent, mild: Secondary | ICD-10-CM

## 2024-02-23 NOTE — Telephone Encounter (Signed)
 Duplicate request.  Requested Prescriptions  Pending Prescriptions Disp Refills   DULoxetine  (CYMBALTA ) 60 MG capsule [Pharmacy Med Name: DULoxetine  HCl 60 MG Oral Capsule Delayed Release Particles] 90 capsule 3    Sig: TAKE 1 CAPSULE BY MOUTH DAILY     Psychiatry: Antidepressants - SNRI - duloxetine  Failed - 02/23/2024 11:59 AM      Failed - Last BP in normal range    BP Readings from Last 1 Encounters:  02/11/24 (!) 145/88         Passed - Cr in normal range and within 360 days    Creat  Date Value Ref Range Status  01/19/2024 0.82 0.50 - 1.03 mg/dL Final   Creatinine, Urine  Date Value Ref Range Status  01/24/2024 96 20 - 275 mg/dL Final         Passed - eGFR is 30 or above and within 360 days    GFR, Est African American  Date Value Ref Range Status  11/28/2018 94 > OR = 60 mL/min/1.14m2 Final   GFR calc Af Amer  Date Value Ref Range Status  05/30/2019 >60 >60 mL/min Final   GFR, Est Non African American  Date Value Ref Range Status  11/28/2018 81 > OR = 60 mL/min/1.52m2 Final   GFR, Estimated  Date Value Ref Range Status  09/17/2023 >60 >60 mL/min Final    Comment:    (NOTE) Calculated using the CKD-EPI Creatinine Equation (2021)    GFR  Date Value Ref Range Status  04/14/2022 74.92 >60.00 mL/min Final    Comment:    Calculated using the CKD-EPI Creatinine Equation (2021)   eGFR  Date Value Ref Range Status  01/19/2024 82 > OR = 60 mL/min/1.48m2 Final         Passed - Completed PHQ-2 or PHQ-9 in the last 360 days      Passed - Valid encounter within last 6 months    Recent Outpatient Visits           1 week ago Infection due to respiratory syncytial virus (RSV), unspecified infection type   [facility] Gareth Mliss FALCON, [name]   4 months ago Slurred speech   [facility] Gareth Mliss FALCON, [name]   6 months ago Encounter for support and coordination of transition of care   [facility] [name], [name]   8 months ago Uncontrolled hypertension   [facility] Glenard Mire, MD   11 months ago AKI (acute kidney injury)   [facility] [name], [name]

## 2024-02-29 ENCOUNTER — Ambulatory Visit: Admitting: Nurse Practitioner

## 2024-03-21 ENCOUNTER — Ambulatory Visit: Admitting: "Endocrinology

## 2024-03-27 ENCOUNTER — Encounter: Admitting: Nurse Practitioner

## 2024-04-06 ENCOUNTER — Ambulatory Visit

## 2024-04-07 ENCOUNTER — Ambulatory Visit
